# Patient Record
Sex: Female | Born: 1935 | Race: White | Hispanic: No | Marital: Married | Smoking: Never smoker
Health system: Southern US, Community
[De-identification: ages and names within clinical notes are randomized; demographics above are authoritative.]

## PROBLEM LIST (undated history)

## (undated) DIAGNOSIS — I5032 Chronic diastolic (congestive) heart failure: Secondary | ICD-10-CM

## (undated) DIAGNOSIS — Z872 Personal history of diseases of the skin and subcutaneous tissue: Secondary | ICD-10-CM

## (undated) DIAGNOSIS — E039 Hypothyroidism, unspecified: Secondary | ICD-10-CM

## (undated) DIAGNOSIS — I4892 Unspecified atrial flutter: Secondary | ICD-10-CM

## (undated) DIAGNOSIS — E785 Hyperlipidemia, unspecified: Secondary | ICD-10-CM

## (undated) DIAGNOSIS — R350 Frequency of micturition: Secondary | ICD-10-CM

## (undated) DIAGNOSIS — D509 Iron deficiency anemia, unspecified: Secondary | ICD-10-CM

## (undated) DIAGNOSIS — Z7901 Long term (current) use of anticoagulants: Secondary | ICD-10-CM

## (undated) DIAGNOSIS — C801 Malignant (primary) neoplasm, unspecified: Secondary | ICD-10-CM

## (undated) DIAGNOSIS — K5909 Other constipation: Secondary | ICD-10-CM

## (undated) DIAGNOSIS — Z8619 Personal history of other infectious and parasitic diseases: Secondary | ICD-10-CM

## (undated) DIAGNOSIS — K5730 Diverticulosis of large intestine without perforation or abscess without bleeding: Secondary | ICD-10-CM

## (undated) DIAGNOSIS — J189 Pneumonia, unspecified organism: Secondary | ICD-10-CM

## (undated) DIAGNOSIS — I4510 Unspecified right bundle-branch block: Secondary | ICD-10-CM

## (undated) DIAGNOSIS — S81802A Unspecified open wound, left lower leg, initial encounter: Secondary | ICD-10-CM

## (undated) DIAGNOSIS — F419 Anxiety disorder, unspecified: Secondary | ICD-10-CM

## (undated) DIAGNOSIS — Z8719 Personal history of other diseases of the digestive system: Secondary | ICD-10-CM

## (undated) DIAGNOSIS — I10 Essential (primary) hypertension: Secondary | ICD-10-CM

## (undated) DIAGNOSIS — R600 Localized edema: Secondary | ICD-10-CM

## (undated) DIAGNOSIS — I4819 Other persistent atrial fibrillation: Secondary | ICD-10-CM

## (undated) DIAGNOSIS — M1990 Unspecified osteoarthritis, unspecified site: Secondary | ICD-10-CM

## (undated) DIAGNOSIS — M545 Low back pain, unspecified: Secondary | ICD-10-CM

## (undated) DIAGNOSIS — K219 Gastro-esophageal reflux disease without esophagitis: Secondary | ICD-10-CM

## (undated) DIAGNOSIS — M8580 Other specified disorders of bone density and structure, unspecified site: Secondary | ICD-10-CM

## (undated) DIAGNOSIS — C50919 Malignant neoplasm of unspecified site of unspecified female breast: Secondary | ICD-10-CM

## (undated) DIAGNOSIS — M4306 Spondylolysis, lumbar region: Secondary | ICD-10-CM

## (undated) HISTORY — PX: TRANSTHORACIC ECHOCARDIOGRAM: SHX275

## (undated) HISTORY — DX: Low back pain, unspecified: M54.50

## (undated) HISTORY — PX: EYE SURGERY: SHX253

## (undated) HISTORY — DX: Unspecified atrial flutter: I48.92

## (undated) HISTORY — DX: Malignant neoplasm of unspecified site of unspecified female breast: C50.919

## (undated) HISTORY — PX: TONSILLECTOMY: SUR1361

## (undated) HISTORY — DX: Essential (primary) hypertension: I10

## (undated) HISTORY — DX: Low back pain: M54.5

## (undated) HISTORY — DX: Other specified disorders of bone density and structure, unspecified site: M85.80

## (undated) HISTORY — DX: Chronic diastolic (congestive) heart failure: I50.32

## (undated) HISTORY — DX: Hypothyroidism, unspecified: E03.9

## (undated) HISTORY — DX: Unspecified osteoarthritis, unspecified site: M19.90

## (undated) HISTORY — DX: Iron deficiency anemia, unspecified: D50.9

## (undated) NOTE — *Deleted (*Deleted)
Patient is a 48 year old female presenting today with complaint of right hip pain.  Approximately 6 weeks ago patient had a fall at her independent living facility resulting in new thoracic compression fractures.  At the time of her fall it was also reported that she had right hip pain however she has had a replacement and CT of the hip was negative at that time.  Patient reports this week she finished her rehab and has been home.  She reports since being home she has had worsening right hip pain.  She denies any new falls or known injury.  However to get up and move she reports its been extremely difficult and her husband has had to help her with everything.  She has very little pain when sitting in bed but it is activated when she attempts to lift her leg.  She has pain in the lateral and posterior portion of her hip with this movement.  She has no pain with axial loading and is neurovascularly intact.  Concern for possible muscular injury versus radicular pain versus bursitis.  No symptoms to suggest septic hip.  Plain films pending.  Patient given pain control with Tylenol.

---

## 1995-01-21 HISTORY — PX: SHOULDER OPEN ROTATOR CUFF REPAIR: SHX2407

## 1997-11-23 ENCOUNTER — Other Ambulatory Visit: Admission: RE | Admit: 1997-11-23 | Discharge: 1997-11-23 | Payer: Self-pay | Admitting: Internal Medicine

## 2001-08-31 ENCOUNTER — Other Ambulatory Visit: Admission: RE | Admit: 2001-08-31 | Discharge: 2001-08-31 | Payer: Self-pay | Admitting: General Practice

## 2001-12-04 ENCOUNTER — Inpatient Hospital Stay (HOSPITAL_COMMUNITY): Admission: EM | Admit: 2001-12-04 | Discharge: 2001-12-10 | Payer: Self-pay | Admitting: Emergency Medicine

## 2001-12-09 ENCOUNTER — Encounter: Payer: Self-pay | Admitting: Gastroenterology

## 2001-12-09 ENCOUNTER — Encounter (INDEPENDENT_AMBULATORY_CARE_PROVIDER_SITE_OTHER): Payer: Self-pay | Admitting: *Deleted

## 2001-12-09 LAB — HM COLONOSCOPY

## 2003-12-28 ENCOUNTER — Ambulatory Visit: Payer: Self-pay | Admitting: Internal Medicine

## 2004-01-02 ENCOUNTER — Ambulatory Visit: Payer: Self-pay | Admitting: Internal Medicine

## 2004-01-31 ENCOUNTER — Ambulatory Visit: Payer: Self-pay | Admitting: Internal Medicine

## 2004-04-23 ENCOUNTER — Ambulatory Visit: Payer: Self-pay | Admitting: Internal Medicine

## 2004-04-29 ENCOUNTER — Encounter: Admission: RE | Admit: 2004-04-29 | Discharge: 2004-04-29 | Payer: Self-pay | Admitting: Internal Medicine

## 2004-04-30 ENCOUNTER — Ambulatory Visit: Payer: Self-pay | Admitting: Internal Medicine

## 2004-05-20 ENCOUNTER — Ambulatory Visit: Payer: Self-pay | Admitting: Internal Medicine

## 2004-10-23 ENCOUNTER — Ambulatory Visit: Payer: Self-pay | Admitting: Internal Medicine

## 2004-11-20 ENCOUNTER — Ambulatory Visit: Payer: Self-pay | Admitting: Internal Medicine

## 2005-05-21 ENCOUNTER — Ambulatory Visit: Payer: Self-pay | Admitting: Internal Medicine

## 2005-06-05 ENCOUNTER — Encounter (HOSPITAL_BASED_OUTPATIENT_CLINIC_OR_DEPARTMENT_OTHER): Admission: RE | Admit: 2005-06-05 | Discharge: 2005-08-27 | Payer: Self-pay | Admitting: Surgery

## 2005-08-26 ENCOUNTER — Ambulatory Visit: Payer: Self-pay | Admitting: Internal Medicine

## 2005-11-26 ENCOUNTER — Ambulatory Visit: Payer: Self-pay | Admitting: Internal Medicine

## 2006-02-27 ENCOUNTER — Ambulatory Visit: Payer: Self-pay | Admitting: Internal Medicine

## 2006-02-27 LAB — CONVERTED CEMR LAB
BUN: 13 mg/dL (ref 6–23)
CO2: 30 meq/L (ref 19–32)
Calcium: 10.3 mg/dL (ref 8.4–10.5)
Chloride: 89 meq/L — ABNORMAL LOW (ref 96–112)
Creatinine, Ser: 0.8 mg/dL (ref 0.4–1.2)
GFR calc Af Amer: 89 mL/min
GFR calc non Af Amer: 74 mL/min
Glucose, Bld: 84 mg/dL (ref 70–99)
Hgb A1c MFr Bld: 5 % (ref 4.6–6.0)
Potassium: 4 meq/L (ref 3.5–5.1)
Sodium: 131 meq/L — ABNORMAL LOW (ref 135–145)
TSH: 0.44 microintl units/mL (ref 0.35–5.50)
Vit D, 1,25-Dihydroxy: 37 (ref 20–57)

## 2006-08-18 ENCOUNTER — Encounter: Admission: RE | Admit: 2006-08-18 | Discharge: 2006-08-18 | Payer: Self-pay | Admitting: Orthopedic Surgery

## 2006-08-28 ENCOUNTER — Ambulatory Visit: Payer: Self-pay | Admitting: Internal Medicine

## 2006-11-30 ENCOUNTER — Encounter: Payer: Self-pay | Admitting: Internal Medicine

## 2006-11-30 ENCOUNTER — Telehealth: Payer: Self-pay | Admitting: Internal Medicine

## 2006-11-30 DIAGNOSIS — I10 Essential (primary) hypertension: Secondary | ICD-10-CM | POA: Insufficient documentation

## 2006-11-30 DIAGNOSIS — M899 Disorder of bone, unspecified: Secondary | ICD-10-CM | POA: Insufficient documentation

## 2006-11-30 DIAGNOSIS — M949 Disorder of cartilage, unspecified: Secondary | ICD-10-CM

## 2006-11-30 DIAGNOSIS — M1990 Unspecified osteoarthritis, unspecified site: Secondary | ICD-10-CM | POA: Insufficient documentation

## 2006-12-28 ENCOUNTER — Encounter: Payer: Self-pay | Admitting: Internal Medicine

## 2007-02-25 ENCOUNTER — Ambulatory Visit: Payer: Self-pay | Admitting: Internal Medicine

## 2007-02-25 DIAGNOSIS — J209 Acute bronchitis, unspecified: Secondary | ICD-10-CM | POA: Insufficient documentation

## 2007-03-02 ENCOUNTER — Ambulatory Visit: Payer: Self-pay | Admitting: Internal Medicine

## 2007-03-02 DIAGNOSIS — R0602 Shortness of breath: Secondary | ICD-10-CM | POA: Insufficient documentation

## 2007-05-10 ENCOUNTER — Encounter: Payer: Self-pay | Admitting: Internal Medicine

## 2007-05-12 ENCOUNTER — Telehealth: Payer: Self-pay | Admitting: Internal Medicine

## 2007-06-01 ENCOUNTER — Ambulatory Visit: Payer: Self-pay | Admitting: Internal Medicine

## 2007-07-19 ENCOUNTER — Encounter: Payer: Self-pay | Admitting: Internal Medicine

## 2007-10-05 ENCOUNTER — Ambulatory Visit: Payer: Self-pay | Admitting: Internal Medicine

## 2007-10-05 DIAGNOSIS — R5381 Other malaise: Secondary | ICD-10-CM | POA: Insufficient documentation

## 2007-10-05 DIAGNOSIS — R5383 Other fatigue: Secondary | ICD-10-CM

## 2007-10-05 DIAGNOSIS — R509 Fever, unspecified: Secondary | ICD-10-CM | POA: Insufficient documentation

## 2007-10-07 LAB — CONVERTED CEMR LAB
BUN: 25 mg/dL — ABNORMAL HIGH (ref 6–23)
Basophils Absolute: 0.1 10*3/uL (ref 0.0–0.1)
Basophils Relative: 1.3 % (ref 0.0–3.0)
Bilirubin Urine: NEGATIVE
CO2: 31 meq/L (ref 19–32)
Calcium: 11.1 mg/dL — ABNORMAL HIGH (ref 8.4–10.5)
Chloride: 96 meq/L (ref 96–112)
Creatinine, Ser: 1.4 mg/dL — ABNORMAL HIGH (ref 0.4–1.2)
Crystals: NEGATIVE
Eosinophils Absolute: 0.1 10*3/uL (ref 0.0–0.7)
Eosinophils Relative: 2 % (ref 0.0–5.0)
GFR calc Af Amer: 47 mL/min
GFR calc non Af Amer: 39 mL/min
Glucose, Bld: 130 mg/dL — ABNORMAL HIGH (ref 70–99)
HCT: 35.4 % — ABNORMAL LOW (ref 36.0–46.0)
Hemoglobin, Urine: NEGATIVE
Hemoglobin: 12.4 g/dL (ref 12.0–15.0)
Ketones, ur: NEGATIVE mg/dL
Lymphocytes Relative: 26.9 % (ref 12.0–46.0)
MCHC: 34.9 g/dL (ref 30.0–36.0)
MCV: 106.9 fL — ABNORMAL HIGH (ref 78.0–100.0)
Monocytes Absolute: 0.5 10*3/uL (ref 0.1–1.0)
Monocytes Relative: 9.8 % (ref 3.0–12.0)
Mucus, UA: NEGATIVE
Neutro Abs: 3.1 10*3/uL (ref 1.4–7.7)
Neutrophils Relative %: 60 % (ref 43.0–77.0)
Nitrite: NEGATIVE
Platelets: 277 10*3/uL (ref 150–400)
Potassium: 4.1 meq/L (ref 3.5–5.1)
RBC / HPF: NONE SEEN
RBC: 3.31 M/uL — ABNORMAL LOW (ref 3.87–5.11)
RDW: 12.4 % (ref 11.5–14.6)
Sodium: 135 meq/L (ref 135–145)
Specific Gravity, Urine: 1.01 (ref 1.000–1.03)
TSH: 0.4 microintl units/mL (ref 0.35–5.50)
Total Protein, Urine: NEGATIVE mg/dL
Urine Glucose: NEGATIVE mg/dL
Urobilinogen, UA: 0.2 (ref 0.0–1.0)
WBC: 5.2 10*3/uL (ref 4.5–10.5)
pH: 7.5 (ref 5.0–8.0)

## 2007-11-10 ENCOUNTER — Telehealth: Payer: Self-pay | Admitting: Internal Medicine

## 2007-11-22 ENCOUNTER — Encounter: Payer: Self-pay | Admitting: Internal Medicine

## 2008-02-02 ENCOUNTER — Ambulatory Visit: Payer: Self-pay | Admitting: Internal Medicine

## 2008-02-04 LAB — CONVERTED CEMR LAB
Bilirubin Urine: NEGATIVE
Crystals: NEGATIVE
Hemoglobin, Urine: NEGATIVE
Ketones, ur: NEGATIVE mg/dL
Mucus, UA: NEGATIVE
Nitrite: NEGATIVE
Specific Gravity, Urine: <=1.005 (ref 1.000–1.03)
Total Protein, Urine: NEGATIVE mg/dL
Urine Glucose: NEGATIVE mg/dL
Urobilinogen, UA: 0.2 (ref 0.0–1.0)
pH: 6.5 (ref 5.0–8.0)

## 2008-04-24 ENCOUNTER — Encounter: Payer: Self-pay | Admitting: Internal Medicine

## 2008-04-25 ENCOUNTER — Encounter: Payer: Self-pay | Admitting: Internal Medicine

## 2008-05-03 ENCOUNTER — Encounter: Payer: Self-pay | Admitting: Internal Medicine

## 2008-05-24 ENCOUNTER — Ambulatory Visit: Payer: Self-pay | Admitting: Internal Medicine

## 2008-05-24 LAB — CONVERTED CEMR LAB
ALT: 25 units/L (ref 0–35)
AST: 36 units/L (ref 0–37)
Albumin: 4.3 g/dL (ref 3.5–5.2)
Alkaline Phosphatase: 48 units/L (ref 39–117)
BUN: 15 mg/dL (ref 6–23)
Bilirubin, Direct: 0.3 mg/dL (ref 0.0–0.3)
CO2: 28 meq/L (ref 19–32)
Calcium: 9.3 mg/dL (ref 8.4–10.5)
Chloride: 99 meq/L (ref 96–112)
Cholesterol: 221 mg/dL — ABNORMAL HIGH (ref 0–200)
Creatinine, Ser: 1.1 mg/dL (ref 0.4–1.2)
Direct LDL: 110.3 mg/dL
GFR calc non Af Amer: 50.72 mL/min (ref >60)
Glucose, Bld: 86 mg/dL (ref 70–99)
HDL: 91.2 mg/dL (ref >39.00)
Hgb A1c MFr Bld: 5.3 % (ref 4.6–6.5)
Potassium: 3.9 meq/L (ref 3.5–5.1)
Sodium: 136 meq/L (ref 135–145)
TSH: 0.36 microintl units/mL (ref 0.35–5.50)
Total Bilirubin: 1.8 mg/dL — ABNORMAL HIGH (ref 0.3–1.2)
Total CHOL/HDL Ratio: 2
Total Protein: 7.2 g/dL (ref 6.0–8.3)
Triglycerides: 63 mg/dL (ref 0.0–149.0)
VLDL: 12.6 mg/dL (ref 0.0–40.0)

## 2008-05-31 ENCOUNTER — Ambulatory Visit: Payer: Self-pay | Admitting: Internal Medicine

## 2008-05-31 DIAGNOSIS — I4892 Unspecified atrial flutter: Secondary | ICD-10-CM | POA: Insufficient documentation

## 2008-07-18 ENCOUNTER — Ambulatory Visit: Payer: Self-pay | Admitting: Cardiovascular Disease

## 2008-07-18 DIAGNOSIS — I4891 Unspecified atrial fibrillation: Secondary | ICD-10-CM | POA: Insufficient documentation

## 2008-07-20 ENCOUNTER — Encounter: Payer: Self-pay | Admitting: Cardiovascular Disease

## 2008-07-20 ENCOUNTER — Ambulatory Visit: Payer: Self-pay

## 2008-07-26 ENCOUNTER — Telehealth: Payer: Self-pay | Admitting: Cardiovascular Disease

## 2008-08-07 ENCOUNTER — Encounter: Admission: RE | Admit: 2008-08-07 | Discharge: 2008-08-07 | Payer: Self-pay | Admitting: Orthopedic Surgery

## 2008-08-10 ENCOUNTER — Telehealth: Payer: Self-pay | Admitting: Cardiovascular Disease

## 2008-08-17 ENCOUNTER — Ambulatory Visit: Payer: Self-pay | Admitting: Cardiovascular Disease

## 2008-08-18 ENCOUNTER — Telehealth: Payer: Self-pay | Admitting: Cardiovascular Disease

## 2008-09-04 ENCOUNTER — Ambulatory Visit: Payer: Self-pay | Admitting: Internal Medicine

## 2008-09-04 DIAGNOSIS — M545 Low back pain, unspecified: Secondary | ICD-10-CM | POA: Insufficient documentation

## 2008-09-06 ENCOUNTER — Encounter (INDEPENDENT_AMBULATORY_CARE_PROVIDER_SITE_OTHER): Payer: Self-pay | Admitting: *Deleted

## 2008-10-18 ENCOUNTER — Ambulatory Visit: Payer: Self-pay | Admitting: Cardiovascular Disease

## 2008-10-23 ENCOUNTER — Ambulatory Visit: Payer: Self-pay | Admitting: Internal Medicine

## 2008-10-23 LAB — CONVERTED CEMR LAB: POC INR: 2.1

## 2008-10-30 ENCOUNTER — Ambulatory Visit: Payer: Self-pay | Admitting: Internal Medicine

## 2008-10-30 LAB — CONVERTED CEMR LAB: POC INR: 3.7

## 2008-11-04 ENCOUNTER — Encounter: Payer: Self-pay | Admitting: Internal Medicine

## 2008-11-06 ENCOUNTER — Telehealth: Payer: Self-pay | Admitting: Internal Medicine

## 2008-11-06 ENCOUNTER — Ambulatory Visit: Payer: Self-pay | Admitting: Cardiovascular Disease

## 2008-11-06 LAB — CONVERTED CEMR LAB: POC INR: 3.5

## 2008-11-07 ENCOUNTER — Ambulatory Visit: Payer: Self-pay | Admitting: Internal Medicine

## 2008-11-07 DIAGNOSIS — G4700 Insomnia, unspecified: Secondary | ICD-10-CM | POA: Insufficient documentation

## 2008-11-08 ENCOUNTER — Encounter: Payer: Self-pay | Admitting: Internal Medicine

## 2008-11-10 ENCOUNTER — Encounter: Payer: Self-pay | Admitting: Internal Medicine

## 2008-11-13 ENCOUNTER — Ambulatory Visit: Payer: Self-pay | Admitting: Cardiology

## 2008-11-13 LAB — CONVERTED CEMR LAB: POC INR: 2.5

## 2008-11-22 ENCOUNTER — Encounter: Payer: Self-pay | Admitting: Internal Medicine

## 2008-11-23 ENCOUNTER — Ambulatory Visit: Payer: Self-pay | Admitting: Internal Medicine

## 2008-11-23 LAB — CONVERTED CEMR LAB: POC INR: 3.6

## 2008-11-28 ENCOUNTER — Encounter (INDEPENDENT_AMBULATORY_CARE_PROVIDER_SITE_OTHER): Payer: Self-pay | Admitting: *Deleted

## 2008-12-04 ENCOUNTER — Ambulatory Visit: Payer: Self-pay | Admitting: Cardiology

## 2008-12-04 ENCOUNTER — Ambulatory Visit: Payer: Self-pay | Admitting: Cardiovascular Disease

## 2008-12-04 LAB — CONVERTED CEMR LAB: POC INR: 2.5

## 2008-12-18 ENCOUNTER — Ambulatory Visit: Payer: Self-pay | Admitting: Cardiology

## 2008-12-18 LAB — CONVERTED CEMR LAB
INR: 2.6
POC INR: 2.6

## 2009-01-02 ENCOUNTER — Inpatient Hospital Stay (HOSPITAL_COMMUNITY): Admission: RE | Admit: 2009-01-02 | Discharge: 2009-01-10 | Payer: Self-pay | Admitting: Neurosurgery

## 2009-01-02 ENCOUNTER — Ambulatory Visit: Payer: Self-pay | Admitting: Cardiology

## 2009-01-02 ENCOUNTER — Ambulatory Visit: Payer: Self-pay | Admitting: Pulmonary Disease

## 2009-01-02 HISTORY — PX: LUMBAR FUSION: SHX111

## 2009-01-04 ENCOUNTER — Ambulatory Visit: Payer: Self-pay | Admitting: Vascular Surgery

## 2009-01-04 ENCOUNTER — Encounter (INDEPENDENT_AMBULATORY_CARE_PROVIDER_SITE_OTHER): Payer: Self-pay | Admitting: Internal Medicine

## 2009-01-04 ENCOUNTER — Encounter: Payer: Self-pay | Admitting: Cardiology

## 2009-01-15 ENCOUNTER — Encounter: Payer: Self-pay | Admitting: Internal Medicine

## 2009-01-15 ENCOUNTER — Encounter (INDEPENDENT_AMBULATORY_CARE_PROVIDER_SITE_OTHER): Payer: Self-pay | Admitting: Cardiology

## 2009-01-15 LAB — CONVERTED CEMR LAB
POC INR: 3.3
Prothrombin Time: 33.3 s

## 2009-01-18 ENCOUNTER — Encounter (INDEPENDENT_AMBULATORY_CARE_PROVIDER_SITE_OTHER): Payer: Self-pay | Admitting: Cardiology

## 2009-01-18 ENCOUNTER — Encounter: Payer: Self-pay | Admitting: Internal Medicine

## 2009-01-18 LAB — CONVERTED CEMR LAB: POC INR: 2.5

## 2009-01-20 DIAGNOSIS — D509 Iron deficiency anemia, unspecified: Secondary | ICD-10-CM

## 2009-01-20 HISTORY — DX: Iron deficiency anemia, unspecified: D50.9

## 2009-01-29 ENCOUNTER — Telehealth: Payer: Self-pay | Admitting: Cardiovascular Disease

## 2009-02-01 ENCOUNTER — Encounter: Payer: Self-pay | Admitting: Internal Medicine

## 2009-02-01 LAB — CONVERTED CEMR LAB
POC INR: 3.5
Prothrombin Time: 35 s

## 2009-02-06 ENCOUNTER — Ambulatory Visit: Payer: Self-pay | Admitting: Internal Medicine

## 2009-02-08 LAB — CONVERTED CEMR LAB
ALT: 18 units/L (ref 0–35)
AST: 19 units/L (ref 0–37)
Albumin: 4.2 g/dL (ref 3.5–5.2)
Alkaline Phosphatase: 53 units/L (ref 39–117)
BUN: 35 mg/dL — ABNORMAL HIGH (ref 6–23)
Basophils Absolute: 0.1 10*3/uL (ref 0.0–0.1)
Basophils Relative: 1.2 % (ref 0.0–3.0)
Bilirubin, Direct: 0.2 mg/dL (ref 0.0–0.3)
CO2: 27 meq/L (ref 19–32)
Calcium: 10.3 mg/dL (ref 8.4–10.5)
Chloride: 100 meq/L (ref 96–112)
Cholesterol: 153 mg/dL (ref 0–200)
Creatinine, Ser: 1.9 mg/dL — ABNORMAL HIGH (ref 0.4–1.2)
Eosinophils Absolute: 0.2 10*3/uL (ref 0.0–0.7)
Eosinophils Relative: 3.2 % (ref 0.0–5.0)
GFR calc non Af Amer: 26.95 mL/min (ref >60)
Glucose, Bld: 81 mg/dL (ref 70–99)
HCT: 34.6 % — ABNORMAL LOW (ref 36.0–46.0)
HDL: 54.1 mg/dL (ref >39.00)
Hemoglobin: 11.5 g/dL — ABNORMAL LOW (ref 12.0–15.0)
LDL Cholesterol: 87 mg/dL (ref 0–99)
Lymphocytes Relative: 23.3 % (ref 12.0–46.0)
Lymphs Abs: 1.2 10*3/uL (ref 0.7–4.0)
MCHC: 33.2 g/dL (ref 30.0–36.0)
MCV: 110.3 fL — ABNORMAL HIGH (ref 78.0–100.0)
Monocytes Absolute: 0.5 10*3/uL (ref 0.1–1.0)
Monocytes Relative: 9.9 % (ref 3.0–12.0)
Neutro Abs: 3 10*3/uL (ref 1.4–7.7)
Neutrophils Relative %: 62.4 % (ref 43.0–77.0)
Platelets: 216 10*3/uL (ref 150.0–400.0)
Potassium: 4.2 meq/L (ref 3.5–5.1)
RBC: 3.14 M/uL — ABNORMAL LOW (ref 3.87–5.11)
RDW: 12.1 % (ref 11.5–14.6)
Sodium: 140 meq/L (ref 135–145)
TSH: 0.68 microintl units/mL (ref 0.35–5.50)
Total Bilirubin: 1.3 mg/dL — ABNORMAL HIGH (ref 0.3–1.2)
Total CHOL/HDL Ratio: 3
Total Protein: 8.2 g/dL (ref 6.0–8.3)
Triglycerides: 60 mg/dL (ref 0.0–149.0)
VLDL: 12 mg/dL (ref 0.0–40.0)
Vitamin B-12: >1500 pg/mL — ABNORMAL HIGH (ref 211–911)
WBC: 5 10*3/uL (ref 4.5–10.5)

## 2009-02-12 ENCOUNTER — Ambulatory Visit: Payer: Self-pay | Admitting: Cardiovascular Disease

## 2009-02-12 DIAGNOSIS — R609 Edema, unspecified: Secondary | ICD-10-CM | POA: Insufficient documentation

## 2009-02-13 ENCOUNTER — Ambulatory Visit: Payer: Self-pay | Admitting: Internal Medicine

## 2009-02-28 ENCOUNTER — Encounter: Payer: Self-pay | Admitting: Internal Medicine

## 2009-02-28 ENCOUNTER — Ambulatory Visit: Payer: Self-pay | Admitting: Cardiology

## 2009-02-28 LAB — CONVERTED CEMR LAB: POC INR: 1.9

## 2009-03-07 ENCOUNTER — Encounter: Admission: RE | Admit: 2009-03-07 | Discharge: 2009-05-30 | Payer: Self-pay | Admitting: Neurosurgery

## 2009-03-13 ENCOUNTER — Telehealth: Payer: Self-pay | Admitting: Internal Medicine

## 2009-03-13 ENCOUNTER — Ambulatory Visit: Payer: Self-pay | Admitting: Internal Medicine

## 2009-03-13 ENCOUNTER — Ambulatory Visit: Payer: Self-pay

## 2009-03-13 ENCOUNTER — Ambulatory Visit: Payer: Self-pay | Admitting: Cardiology

## 2009-03-13 ENCOUNTER — Encounter: Payer: Self-pay | Admitting: Internal Medicine

## 2009-03-13 LAB — CONVERTED CEMR LAB: POC INR: 1.8

## 2009-03-14 ENCOUNTER — Telehealth: Payer: Self-pay | Admitting: Internal Medicine

## 2009-03-19 ENCOUNTER — Ambulatory Visit: Payer: Self-pay | Admitting: Internal Medicine

## 2009-03-19 DIAGNOSIS — N259 Disorder resulting from impaired renal tubular function, unspecified: Secondary | ICD-10-CM | POA: Insufficient documentation

## 2009-03-19 DIAGNOSIS — R55 Syncope and collapse: Secondary | ICD-10-CM | POA: Insufficient documentation

## 2009-03-21 LAB — CONVERTED CEMR LAB
BUN: 39 mg/dL — ABNORMAL HIGH (ref 6–23)
CO2: 32 meq/L (ref 19–32)
Calcium: 10.2 mg/dL (ref 8.4–10.5)
Chloride: 94 meq/L — ABNORMAL LOW (ref 96–112)
Creatinine, Ser: 2.3 mg/dL — ABNORMAL HIGH (ref 0.4–1.2)
GFR calc non Af Amer: 21.61 mL/min (ref >60)
Glucose, Bld: 122 mg/dL — ABNORMAL HIGH (ref 70–99)
Potassium: 3.5 meq/L (ref 3.5–5.1)
Sodium: 136 meq/L (ref 135–145)

## 2009-03-28 ENCOUNTER — Ambulatory Visit: Payer: Self-pay | Admitting: Cardiovascular Disease

## 2009-03-28 LAB — CONVERTED CEMR LAB: POC INR: 1.9

## 2009-04-04 ENCOUNTER — Encounter: Payer: Self-pay | Admitting: Internal Medicine

## 2009-04-18 ENCOUNTER — Ambulatory Visit: Payer: Self-pay | Admitting: Cardiovascular Disease

## 2009-04-18 LAB — CONVERTED CEMR LAB: POC INR: 1.9

## 2009-04-26 ENCOUNTER — Ambulatory Visit: Payer: Self-pay | Admitting: Internal Medicine

## 2009-04-30 LAB — CONVERTED CEMR LAB
BUN: 14 mg/dL (ref 6–23)
CO2: 31 meq/L (ref 19–32)
Calcium: 9.7 mg/dL (ref 8.4–10.5)
Chloride: 93 meq/L — ABNORMAL LOW (ref 96–112)
Creatinine, Ser: 1.1 mg/dL (ref 0.4–1.2)
GFR calc non Af Amer: 50.61 mL/min (ref >60)
Glucose, Bld: 78 mg/dL (ref 70–99)
Potassium: 4.1 meq/L (ref 3.5–5.1)
Sodium: 134 meq/L — ABNORMAL LOW (ref 135–145)

## 2009-05-03 ENCOUNTER — Ambulatory Visit: Payer: Self-pay | Admitting: Internal Medicine

## 2009-05-09 ENCOUNTER — Telehealth (INDEPENDENT_AMBULATORY_CARE_PROVIDER_SITE_OTHER): Payer: Self-pay | Admitting: *Deleted

## 2009-05-09 ENCOUNTER — Ambulatory Visit: Payer: Self-pay | Admitting: Cardiology

## 2009-05-09 LAB — CONVERTED CEMR LAB: POC INR: 2

## 2009-05-21 ENCOUNTER — Encounter: Payer: Self-pay | Admitting: Internal Medicine

## 2009-05-25 ENCOUNTER — Ambulatory Visit: Payer: Self-pay | Admitting: Internal Medicine

## 2009-05-25 LAB — CONVERTED CEMR LAB: POC INR: 1.9

## 2009-06-12 ENCOUNTER — Telehealth: Payer: Self-pay | Admitting: Cardiovascular Disease

## 2009-06-14 ENCOUNTER — Ambulatory Visit: Payer: Self-pay | Admitting: Cardiology

## 2009-06-14 LAB — CONVERTED CEMR LAB: POC INR: 2.1

## 2009-07-12 ENCOUNTER — Ambulatory Visit: Payer: Self-pay | Admitting: Internal Medicine

## 2009-07-12 LAB — CONVERTED CEMR LAB: POC INR: 2.6

## 2009-08-10 ENCOUNTER — Ambulatory Visit: Payer: Self-pay | Admitting: Cardiology

## 2009-08-10 ENCOUNTER — Ambulatory Visit: Payer: Self-pay | Admitting: Cardiovascular Disease

## 2009-08-10 LAB — CONVERTED CEMR LAB: POC INR: 2.6

## 2009-08-13 ENCOUNTER — Encounter: Payer: Self-pay | Admitting: Internal Medicine

## 2009-08-15 ENCOUNTER — Ambulatory Visit: Payer: Self-pay | Admitting: Internal Medicine

## 2009-09-07 ENCOUNTER — Ambulatory Visit: Payer: Self-pay | Admitting: Cardiology

## 2009-09-07 LAB — CONVERTED CEMR LAB: POC INR: 4

## 2009-09-27 ENCOUNTER — Ambulatory Visit: Payer: Self-pay | Admitting: Internal Medicine

## 2009-09-27 LAB — CONVERTED CEMR LAB: POC INR: 3.4

## 2009-10-11 ENCOUNTER — Telehealth: Payer: Self-pay | Admitting: Internal Medicine

## 2009-10-11 ENCOUNTER — Ambulatory Visit: Payer: Self-pay | Admitting: Cardiology

## 2009-10-11 LAB — CONVERTED CEMR LAB: POC INR: 3.2

## 2009-10-25 ENCOUNTER — Telehealth: Payer: Self-pay | Admitting: Gastroenterology

## 2009-10-25 ENCOUNTER — Ambulatory Visit: Payer: Self-pay | Admitting: Internal Medicine

## 2009-10-25 DIAGNOSIS — K921 Melena: Secondary | ICD-10-CM | POA: Insufficient documentation

## 2009-10-25 DIAGNOSIS — E039 Hypothyroidism, unspecified: Secondary | ICD-10-CM | POA: Insufficient documentation

## 2009-10-25 LAB — CONVERTED CEMR LAB
POC INR: 3.1
Sed Rate: 55 mm/hr — ABNORMAL HIGH (ref 0–22)
Vitamin B-12: 1229 pg/mL — ABNORMAL HIGH (ref 211–911)

## 2009-10-26 ENCOUNTER — Ambulatory Visit: Payer: Self-pay | Admitting: Gastroenterology

## 2009-10-26 ENCOUNTER — Encounter (INDEPENDENT_AMBULATORY_CARE_PROVIDER_SITE_OTHER): Payer: Self-pay | Admitting: *Deleted

## 2009-10-26 DIAGNOSIS — R195 Other fecal abnormalities: Secondary | ICD-10-CM | POA: Insufficient documentation

## 2009-10-26 LAB — CONVERTED CEMR LAB
ALT: 22 units/L (ref 0–35)
AST: 24 units/L (ref 0–37)
Albumin: 3.5 g/dL (ref 3.5–5.2)
Alkaline Phosphatase: 47 units/L (ref 39–117)
BUN: 29 mg/dL — ABNORMAL HIGH (ref 6–23)
Basophils Absolute: 0 10*3/uL (ref 0.0–0.1)
Basophils Absolute: 0.1 10*3/uL (ref 0.0–0.1)
Basophils Relative: 0.4 % (ref 0.0–3.0)
Basophils Relative: 0.6 % (ref 0.0–3.0)
Bilirubin Urine: NEGATIVE
Bilirubin, Direct: 0.2 mg/dL (ref 0.0–0.3)
CO2: 28 meq/L (ref 19–32)
Calcium: 9.2 mg/dL (ref 8.4–10.5)
Chloride: 95 meq/L — ABNORMAL LOW (ref 96–112)
Creatinine, Ser: 1.4 mg/dL — ABNORMAL HIGH (ref 0.4–1.2)
Eosinophils Absolute: 0.1 10*3/uL (ref 0.0–0.7)
Eosinophils Absolute: 0.1 10*3/uL (ref 0.0–0.7)
Eosinophils Relative: 0.8 % (ref 0.0–5.0)
Eosinophils Relative: 0.6 % (ref 0.0–5.0)
GFR calc non Af Amer: 37.34 mL/min (ref >60)
Glucose, Bld: 116 mg/dL — ABNORMAL HIGH (ref 70–99)
HCT: 29.5 % — ABNORMAL LOW (ref 36.0–46.0)
HCT: 27.3 % — ABNORMAL LOW (ref 36.0–46.0)
Hemoglobin: 10.2 g/dL — ABNORMAL LOW (ref 12.0–15.0)
Hemoglobin: 9.1 g/dL — ABNORMAL LOW (ref 12.0–15.0)
INR: 3.3 — ABNORMAL HIGH (ref 0.8–1.0)
Ketones, ur: NEGATIVE mg/dL
Lymphocytes Relative: 14.4 % (ref 12.0–46.0)
Lymphocytes Relative: 11.9 % — ABNORMAL LOW (ref 12.0–46.0)
Lymphs Abs: 1.2 10*3/uL (ref 0.7–4.0)
Lymphs Abs: 1.1 10*3/uL (ref 0.7–4.0)
MCHC: 34.4 g/dL (ref 30.0–36.0)
MCHC: 33.5 g/dL (ref 30.0–36.0)
MCV: 111.4 fL — ABNORMAL HIGH (ref 78.0–100.0)
MCV: 110.6 fL — ABNORMAL HIGH (ref 78.0–100.0)
Monocytes Absolute: 0.9 10*3/uL (ref 0.1–1.0)
Monocytes Absolute: 0.7 10*3/uL (ref 0.1–1.0)
Monocytes Relative: 11.3 % (ref 3.0–12.0)
Monocytes Relative: 7.7 % (ref 3.0–12.0)
Neutro Abs: 5.9 10*3/uL (ref 1.4–7.7)
Neutro Abs: 7.2 10*3/uL (ref 1.4–7.7)
Neutrophils Relative %: 73.1 % (ref 43.0–77.0)
Neutrophils Relative %: 79.2 % — ABNORMAL HIGH (ref 43.0–77.0)
Nitrite: NEGATIVE
Platelets: 279 10*3/uL (ref 150.0–400.0)
Platelets: 279 10*3/uL (ref 150.0–400.0)
Potassium: 4.7 meq/L (ref 3.5–5.1)
Pro B Natriuretic peptide (BNP): 394.9 pg/mL — ABNORMAL HIGH (ref 0.0–100.0)
Prothrombin Time: 35.4 s — ABNORMAL HIGH (ref 9.7–11.8)
RBC: 2.65 M/uL — ABNORMAL LOW (ref 3.87–5.11)
RBC: 2.47 M/uL — ABNORMAL LOW (ref 3.87–5.11)
RDW: 14 % (ref 11.5–14.6)
RDW: 13.9 % (ref 11.5–14.6)
Sodium: 131 meq/L — ABNORMAL LOW (ref 135–145)
Specific Gravity, Urine: 1.01 (ref 1.000–1.030)
TSH: 6.06 microintl units/mL — ABNORMAL HIGH (ref 0.35–5.50)
Total Bilirubin: 1.1 mg/dL (ref 0.3–1.2)
Total Protein, Urine: NEGATIVE mg/dL
Total Protein: 6.4 g/dL (ref 6.0–8.3)
Urine Glucose: NEGATIVE mg/dL
Urobilinogen, UA: 0.2 (ref 0.0–1.0)
WBC: 8.1 10*3/uL (ref 4.5–10.5)
WBC: 9.1 10*3/uL (ref 4.5–10.5)
pH: 7 (ref 5.0–8.0)

## 2009-10-29 ENCOUNTER — Ambulatory Visit: Payer: Self-pay | Admitting: Gastroenterology

## 2009-10-29 ENCOUNTER — Encounter (INDEPENDENT_AMBULATORY_CARE_PROVIDER_SITE_OTHER): Payer: Self-pay | Admitting: *Deleted

## 2009-10-29 LAB — CONVERTED CEMR LAB
Basophils Absolute: 0 10*3/uL (ref 0.0–0.1)
Basophils Relative: 0.3 % (ref 0.0–3.0)
Eosinophils Absolute: 0.1 10*3/uL (ref 0.0–0.7)
Eosinophils Relative: 0.8 % (ref 0.0–5.0)
HCT: 24.9 % — ABNORMAL LOW (ref 36.0–46.0)
Hemoglobin: 8.7 g/dL — ABNORMAL LOW (ref 12.0–15.0)
INR: 1.6 — ABNORMAL HIGH (ref 0.8–1.0)
Lymphocytes Relative: 11.4 % — ABNORMAL LOW (ref 12.0–46.0)
Lymphs Abs: 0.8 10*3/uL (ref 0.7–4.0)
MCHC: 34.9 g/dL (ref 30.0–36.0)
MCV: 109.4 fL — ABNORMAL HIGH (ref 78.0–100.0)
Monocytes Absolute: 0.4 10*3/uL (ref 0.1–1.0)
Monocytes Relative: 6.3 % (ref 3.0–12.0)
Neutro Abs: 5.6 10*3/uL (ref 1.4–7.7)
Neutrophils Relative %: 81.2 % — ABNORMAL HIGH (ref 43.0–77.0)
Platelets: 263 10*3/uL (ref 150.0–400.0)
Prothrombin Time: 16.8 s — ABNORMAL HIGH (ref 9.7–11.8)
RBC: 2.28 M/uL — ABNORMAL LOW (ref 3.87–5.11)
RDW: 13.4 % (ref 11.5–14.6)
WBC: 6.9 10*3/uL (ref 4.5–10.5)

## 2009-10-30 ENCOUNTER — Telehealth: Payer: Self-pay | Admitting: Internal Medicine

## 2009-10-31 ENCOUNTER — Ambulatory Visit: Payer: Self-pay | Admitting: Internal Medicine

## 2009-10-31 DIAGNOSIS — M25559 Pain in unspecified hip: Secondary | ICD-10-CM | POA: Insufficient documentation

## 2009-10-31 DIAGNOSIS — D509 Iron deficiency anemia, unspecified: Secondary | ICD-10-CM | POA: Insufficient documentation

## 2009-11-06 ENCOUNTER — Telehealth: Payer: Self-pay | Admitting: Internal Medicine

## 2009-11-07 ENCOUNTER — Telehealth: Payer: Self-pay | Admitting: Internal Medicine

## 2009-11-07 ENCOUNTER — Telehealth: Payer: Self-pay | Admitting: Cardiovascular Disease

## 2009-11-08 ENCOUNTER — Inpatient Hospital Stay (HOSPITAL_COMMUNITY): Admission: EM | Admit: 2009-11-08 | Discharge: 2009-11-13 | Payer: Self-pay | Admitting: Emergency Medicine

## 2009-11-08 ENCOUNTER — Encounter: Payer: Self-pay | Admitting: Internal Medicine

## 2009-11-08 ENCOUNTER — Ambulatory Visit: Payer: Self-pay | Admitting: Cardiology

## 2009-11-08 ENCOUNTER — Ambulatory Visit: Payer: Self-pay | Admitting: Cardiovascular Disease

## 2009-11-09 ENCOUNTER — Ambulatory Visit: Payer: Self-pay | Admitting: Internal Medicine

## 2009-11-09 LAB — CONVERTED CEMR LAB
Basophils Absolute: 0 10*3/uL (ref 0.0–0.1)
Basophils Relative: 0 % (ref 0–1)
Eosinophils Absolute: 0.1 10*3/uL (ref 0.0–0.7)
Eosinophils Relative: 2 % (ref 0–5)
HCT: 24.5 % — ABNORMAL LOW (ref 36.0–46.0)
Hemoglobin: 8.8 g/dL — ABNORMAL LOW (ref 12.0–15.0)
Lymphocytes Relative: 13 % (ref 12–46)
Lymphs Abs: 0.6 10*3/uL — ABNORMAL LOW (ref 0.7–4.0)
MCHC: 35.9 g/dL (ref 30.0–36.0)
MCV: 102.1 fL — ABNORMAL HIGH (ref 78.0–100.0)
Monocytes Absolute: 0.7 10*3/uL (ref 0.1–1.0)
Monocytes Relative: 15 % — ABNORMAL HIGH (ref 3–12)
Neutro Abs: 3.3 10*3/uL (ref 1.7–7.7)
Neutrophils Relative %: 69 % (ref 43–77)
Platelets: 361 10*3/uL (ref 150–400)
RBC: 2.4 M/uL — ABNORMAL LOW (ref 3.87–5.11)
RDW: 11.8 % (ref 11.5–15.5)
WBC: 4.6 10*3/uL (ref 4.0–10.5)

## 2009-11-11 ENCOUNTER — Encounter: Payer: Self-pay | Admitting: Cardiovascular Disease

## 2009-11-13 ENCOUNTER — Telehealth: Payer: Self-pay | Admitting: Internal Medicine

## 2009-11-14 ENCOUNTER — Telehealth: Payer: Self-pay | Admitting: Internal Medicine

## 2009-11-15 ENCOUNTER — Ambulatory Visit: Payer: Self-pay | Admitting: Internal Medicine

## 2009-11-15 ENCOUNTER — Encounter: Payer: Self-pay | Admitting: Cardiovascular Disease

## 2009-11-15 ENCOUNTER — Telehealth: Payer: Self-pay | Admitting: Internal Medicine

## 2009-11-15 ENCOUNTER — Encounter: Payer: Self-pay | Admitting: Internal Medicine

## 2009-11-15 LAB — CONVERTED CEMR LAB
POC INR: 1.73
Prothrombin Time: 20.4 s

## 2009-11-16 ENCOUNTER — Telehealth: Payer: Self-pay | Admitting: Internal Medicine

## 2009-11-19 ENCOUNTER — Ambulatory Visit: Payer: Self-pay | Admitting: Cardiovascular Disease

## 2009-11-20 ENCOUNTER — Telehealth: Payer: Self-pay | Admitting: Internal Medicine

## 2009-11-21 ENCOUNTER — Encounter: Payer: Self-pay | Admitting: Internal Medicine

## 2009-11-21 ENCOUNTER — Telehealth: Payer: Self-pay | Admitting: Internal Medicine

## 2009-11-21 ENCOUNTER — Encounter (INDEPENDENT_AMBULATORY_CARE_PROVIDER_SITE_OTHER): Payer: Self-pay | Admitting: Pharmacist

## 2009-11-21 ENCOUNTER — Encounter: Payer: Self-pay | Admitting: Cardiology

## 2009-11-21 LAB — CONVERTED CEMR LAB
POC INR: 1.7
Prothrombin Time: 20.2 s

## 2009-11-22 ENCOUNTER — Ambulatory Visit: Payer: Self-pay | Admitting: Internal Medicine

## 2009-11-22 DIAGNOSIS — R21 Rash and other nonspecific skin eruption: Secondary | ICD-10-CM | POA: Insufficient documentation

## 2009-11-22 DIAGNOSIS — K279 Peptic ulcer, site unspecified, unspecified as acute or chronic, without hemorrhage or perforation: Secondary | ICD-10-CM | POA: Insufficient documentation

## 2009-11-23 ENCOUNTER — Ambulatory Visit: Payer: Self-pay | Admitting: Oncology

## 2009-11-26 ENCOUNTER — Ambulatory Visit: Payer: Self-pay | Admitting: Internal Medicine

## 2009-11-26 ENCOUNTER — Telehealth: Payer: Self-pay | Admitting: Gastroenterology

## 2009-11-26 ENCOUNTER — Telehealth: Payer: Self-pay | Admitting: Internal Medicine

## 2009-11-28 ENCOUNTER — Encounter: Payer: Self-pay | Admitting: Cardiovascular Disease

## 2009-11-28 ENCOUNTER — Encounter: Payer: Self-pay | Admitting: Internal Medicine

## 2009-11-28 LAB — CBC & DIFF AND RETIC
BASO%: 0.9 % (ref 0.0–2.0)
Basophils Absolute: 0.1 10e3/uL (ref 0.0–0.1)
EOS%: 2.1 % (ref 0.0–7.0)
Eosinophils Absolute: 0.1 10e3/uL (ref 0.0–0.5)
HCT: 26.9 % — ABNORMAL LOW (ref 34.8–46.6)
HGB: 8.7 g/dL — ABNORMAL LOW (ref 11.6–15.9)
Immature Retic Fract: 12.8 % — ABNORMAL HIGH (ref 0.00–10.70)
LYMPH%: 18.3 % (ref 14.0–49.7)
MCH: 33.1 pg (ref 25.1–34.0)
MCHC: 32.3 g/dL (ref 31.5–36.0)
MCV: 102.3 fL — ABNORMAL HIGH (ref 79.5–101.0)
MONO#: 0.6 10e3/uL (ref 0.1–0.9)
MONO%: 10.3 % (ref 0.0–14.0)
NEUT#: 4 10e3/uL (ref 1.5–6.5)
NEUT%: 68.4 % (ref 38.4–76.8)
Platelets: 347 10e3/uL (ref 145–400)
RBC: 2.63 10e6/uL — ABNORMAL LOW (ref 3.70–5.45)
RDW: 13.1 % (ref 11.2–14.5)
Retic %: 1.39 % (ref 0.50–1.50)
Retic Ct Abs: 36.56 10e3/uL (ref 18.30–72.70)
WBC: 5.9 10e3/uL (ref 3.9–10.3)
lymph#: 1.1 10e3/uL (ref 0.9–3.3)

## 2009-11-28 LAB — CONVERTED CEMR LAB
POC INR: 2.37
Prothrombin Time: 25.4 s

## 2009-11-28 LAB — CHCC SMEAR

## 2009-11-29 ENCOUNTER — Ambulatory Visit: Payer: Self-pay | Admitting: Internal Medicine

## 2009-11-29 ENCOUNTER — Encounter: Payer: Self-pay | Admitting: Cardiovascular Disease

## 2009-11-29 ENCOUNTER — Ambulatory Visit: Payer: Self-pay | Admitting: Gastroenterology

## 2009-11-30 ENCOUNTER — Encounter: Payer: Self-pay | Admitting: Internal Medicine

## 2009-11-30 ENCOUNTER — Ambulatory Visit: Payer: Self-pay | Admitting: Gastroenterology

## 2009-11-30 LAB — COMPREHENSIVE METABOLIC PANEL
ALT: 16 U/L (ref 0–35)
AST: 18 U/L (ref 0–37)
Albumin: 3.8 g/dL (ref 3.5–5.2)
Alkaline Phosphatase: 70 U/L (ref 39–117)
BUN: 9 mg/dL (ref 6–23)
CO2: 24 mEq/L (ref 19–32)
Calcium: 9.1 mg/dL (ref 8.4–10.5)
Chloride: 96 mEq/L (ref 96–112)
Creatinine, Ser: 0.99 mg/dL (ref 0.40–1.20)
Glucose, Bld: 122 mg/dL — ABNORMAL HIGH (ref 70–99)
Potassium: 3.5 mEq/L (ref 3.5–5.3)
Sodium: 132 mEq/L — ABNORMAL LOW (ref 135–145)
Total Bilirubin: 0.5 mg/dL (ref 0.3–1.2)
Total Protein: 7 g/dL (ref 6.0–8.3)

## 2009-11-30 LAB — SPEP & IFE WITH QIG
Albumin ELP: 47 % — ABNORMAL LOW (ref 55.8–66.1)
Alpha-1-Globulin: 7.4 % — ABNORMAL HIGH (ref 2.9–4.9)
Alpha-2-Globulin: 16.3 % — ABNORMAL HIGH (ref 7.1–11.8)
Beta 2: 7 % — ABNORMAL HIGH (ref 3.2–6.5)
Beta Globulin: 7 % (ref 4.7–7.2)
Gamma Globulin: 15.3 % (ref 11.1–18.8)
IgA: 330 mg/dL (ref 68–378)
IgG (Immunoglobin G), Serum: 1290 mg/dL (ref 694–1618)
IgM, Serum: 127 mg/dL (ref 60–263)
Total Protein, Serum Electrophoresis: 7 g/dL (ref 6.0–8.3)

## 2009-11-30 LAB — ERYTHROPOIETIN: Erythropoietin: 100 m[IU]/mL — ABNORMAL HIGH (ref 2.6–34.0)

## 2009-11-30 LAB — IRON AND TIBC
%SAT: 14 % — ABNORMAL LOW (ref 20–55)
Iron: 40 ug/dL — ABNORMAL LOW (ref 42–145)
TIBC: 294 ug/dL (ref 250–470)
UIBC: 254 ug/dL

## 2009-11-30 LAB — FERRITIN: Ferritin: 130 ng/mL (ref 10–291)

## 2009-11-30 LAB — FOLATE: Folate: >20 ng/mL

## 2009-12-05 ENCOUNTER — Ambulatory Visit: Payer: Self-pay | Admitting: Internal Medicine

## 2009-12-07 ENCOUNTER — Encounter: Payer: Self-pay | Admitting: Internal Medicine

## 2009-12-07 ENCOUNTER — Telehealth: Payer: Self-pay | Admitting: Internal Medicine

## 2009-12-12 ENCOUNTER — Encounter: Payer: Self-pay | Admitting: Cardiology

## 2009-12-12 ENCOUNTER — Encounter: Payer: Self-pay | Admitting: Internal Medicine

## 2009-12-12 LAB — CONVERTED CEMR LAB
POC INR: 2.3
Prothrombin Time: 22.7 s

## 2009-12-17 ENCOUNTER — Telehealth: Payer: Self-pay | Admitting: Internal Medicine

## 2009-12-17 ENCOUNTER — Ambulatory Visit: Payer: Self-pay | Admitting: Internal Medicine

## 2009-12-17 LAB — CONVERTED CEMR LAB
Albumin ELP: 49.9 % — ABNORMAL LOW (ref 55.8–66.1)
Alpha-1-Globulin: 6.3 % — ABNORMAL HIGH (ref 2.9–4.9)
Alpha-2-Globulin: 14.8 % — ABNORMAL HIGH (ref 7.1–11.8)
Beta Globulin: 5.9 % (ref 4.7–7.2)
Gamma Globulin: 16.1 % (ref 11.1–18.8)
Total Protein, Serum Electrophoresis: 6.9 g/dL (ref 6.0–8.3)

## 2009-12-19 ENCOUNTER — Encounter: Payer: Self-pay | Admitting: Internal Medicine

## 2009-12-19 LAB — CONVERTED CEMR LAB
ALT: 13 units/L (ref 0–35)
AST: 23 units/L (ref 0–37)
Albumin: 3.4 g/dL — ABNORMAL LOW (ref 3.5–5.2)
Alkaline Phosphatase: 62 units/L (ref 39–117)
BUN: 9 mg/dL (ref 6–23)
Basophils Absolute: 0.1 10*3/uL (ref 0.0–0.1)
Basophils Relative: 1.2 % (ref 0.0–3.0)
Bilirubin, Direct: 0.2 mg/dL (ref 0.0–0.3)
CO2: 27 meq/L (ref 19–32)
Calcium: 9.2 mg/dL (ref 8.4–10.5)
Chloride: 100 meq/L (ref 96–112)
Creatinine, Ser: 0.9 mg/dL (ref 0.4–1.2)
Eosinophils Absolute: 0.1 10*3/uL (ref 0.0–0.7)
Eosinophils Relative: 2.1 % (ref 0.0–5.0)
GFR calc non Af Amer: 67.12 mL/min (ref >60)
Glucose, Bld: 131 mg/dL — ABNORMAL HIGH (ref 70–99)
HCT: 30.3 % — ABNORMAL LOW (ref 36.0–46.0)
Hemoglobin: 10.6 g/dL — ABNORMAL LOW (ref 12.0–15.0)
Iron: 91 ug/dL (ref 42–145)
Lymphocytes Relative: 17 % (ref 12.0–46.0)
Lymphs Abs: 0.8 10*3/uL (ref 0.7–4.0)
MCHC: 34.9 g/dL (ref 30.0–36.0)
MCV: 108.4 fL — ABNORMAL HIGH (ref 78.0–100.0)
Monocytes Absolute: 0.6 10*3/uL (ref 0.1–1.0)
Monocytes Relative: 12.5 % — ABNORMAL HIGH (ref 3.0–12.0)
Neutro Abs: 3 10*3/uL (ref 1.4–7.7)
Neutrophils Relative %: 67.2 % (ref 43.0–77.0)
Platelets: 328 10*3/uL (ref 150.0–400.0)
Potassium: 3.5 meq/L (ref 3.5–5.1)
RBC: 2.8 M/uL — ABNORMAL LOW (ref 3.87–5.11)
RDW: 17.2 % — ABNORMAL HIGH (ref 11.5–14.6)
Saturation Ratios: 29.4 % (ref 20.0–50.0)
Sed Rate: 78 mm/hr — ABNORMAL HIGH (ref 0–22)
Sodium: 137 meq/L (ref 135–145)
Total Bilirubin: 0.6 mg/dL (ref 0.3–1.2)
Total Protein: 6.6 g/dL (ref 6.0–8.3)
Transferrin: 221.3 mg/dL (ref 212.0–360.0)
Vitamin B-12: 828 pg/mL (ref 211–911)
WBC: 4.5 10*3/uL (ref 4.5–10.5)

## 2009-12-24 ENCOUNTER — Ambulatory Visit: Payer: Self-pay | Admitting: Internal Medicine

## 2010-01-02 ENCOUNTER — Ambulatory Visit: Payer: Self-pay | Admitting: Oncology

## 2010-01-02 ENCOUNTER — Encounter: Payer: Self-pay | Admitting: Internal Medicine

## 2010-01-04 ENCOUNTER — Encounter: Payer: Self-pay | Admitting: Internal Medicine

## 2010-01-04 LAB — CBC WITH DIFFERENTIAL/PLATELET
BASO%: 0.8 % (ref 0.0–2.0)
Basophils Absolute: 0 10*3/uL (ref 0.0–0.1)
EOS%: 2.8 % (ref 0.0–7.0)
Eosinophils Absolute: 0.1 10*3/uL (ref 0.0–0.5)
HCT: 35.7 % (ref 34.8–46.6)
HGB: 11.9 g/dL (ref 11.6–15.9)
LYMPH%: 25.4 % (ref 14.0–49.7)
MCH: 34.6 pg — ABNORMAL HIGH (ref 25.1–34.0)
MCHC: 33.3 g/dL (ref 31.5–36.0)
MCV: 104.2 fL — ABNORMAL HIGH (ref 79.5–101.0)
MONO#: 0.7 10*3/uL (ref 0.1–0.9)
MONO%: 12.9 % (ref 0.0–14.0)
NEUT#: 3 10*3/uL (ref 1.5–6.5)
NEUT%: 58.1 % (ref 38.4–76.8)
Platelets: 299 10*3/uL (ref 145–400)
RBC: 3.43 10*6/uL — ABNORMAL LOW (ref 3.70–5.45)
RDW: 17 % — ABNORMAL HIGH (ref 11.2–14.5)
WBC: 5.1 10*3/uL (ref 3.9–10.3)
lymph#: 1.3 10*3/uL (ref 0.9–3.3)

## 2010-01-10 ENCOUNTER — Ambulatory Visit: Payer: Self-pay | Admitting: Cardiology

## 2010-01-10 LAB — CONVERTED CEMR LAB: POC INR: 3

## 2010-01-22 ENCOUNTER — Telehealth: Payer: Self-pay | Admitting: Internal Medicine

## 2010-02-06 ENCOUNTER — Encounter
Admission: RE | Admit: 2010-02-06 | Discharge: 2010-02-19 | Payer: Self-pay | Source: Home / Self Care | Attending: Internal Medicine | Admitting: Internal Medicine

## 2010-02-07 ENCOUNTER — Ambulatory Visit: Admission: RE | Admit: 2010-02-07 | Discharge: 2010-02-07 | Payer: Self-pay | Source: Home / Self Care

## 2010-02-07 LAB — CONVERTED CEMR LAB: POC INR: 2.6

## 2010-02-13 ENCOUNTER — Encounter (INDEPENDENT_AMBULATORY_CARE_PROVIDER_SITE_OTHER): Payer: Self-pay | Admitting: *Deleted

## 2010-02-13 ENCOUNTER — Encounter: Payer: Self-pay | Admitting: Internal Medicine

## 2010-02-13 ENCOUNTER — Telehealth (INDEPENDENT_AMBULATORY_CARE_PROVIDER_SITE_OTHER): Payer: Self-pay | Admitting: *Deleted

## 2010-02-17 LAB — CONVERTED CEMR LAB
BUN: 44 mg/dL — ABNORMAL HIGH (ref 6–23)
Basophils Absolute: 0.1 10*3/uL (ref 0.0–0.1)
Basophils Relative: 1.8 % (ref 0.0–3.0)
CO2: 29 meq/L (ref 19–32)
Calcium: 10.1 mg/dL (ref 8.4–10.5)
Chloride: 98 meq/L (ref 96–112)
Creatinine, Ser: 2.3 mg/dL — ABNORMAL HIGH (ref 0.4–1.2)
Eosinophils Absolute: 0.2 10*3/uL (ref 0.0–0.7)
Eosinophils Relative: 4.3 % (ref 0.0–5.0)
Free T4: 0.9 ng/dL (ref 0.6–1.6)
GFR calc non Af Amer: 21.61 mL/min (ref >60)
Glucose, Bld: 131 mg/dL — ABNORMAL HIGH (ref 70–99)
HCT: 36.5 % (ref 36.0–46.0)
Hemoglobin: 12.4 g/dL (ref 12.0–15.0)
Lymphocytes Relative: 34.5 % (ref 12.0–46.0)
Lymphs Abs: 1.4 10*3/uL (ref 0.7–4.0)
MCHC: 33.9 g/dL (ref 30.0–36.0)
MCV: 110 fL — ABNORMAL HIGH (ref 78.0–100.0)
Monocytes Absolute: 0.6 10*3/uL (ref 0.1–1.0)
Monocytes Relative: 15.1 % — ABNORMAL HIGH (ref 3.0–12.0)
Neutro Abs: 1.8 10*3/uL (ref 1.4–7.7)
Neutrophils Relative %: 44.3 % (ref 43.0–77.0)
Platelets: 201 10*3/uL (ref 150.0–400.0)
Potassium: 3.4 meq/L — ABNORMAL LOW (ref 3.5–5.1)
RBC: 3.31 M/uL — ABNORMAL LOW (ref 3.87–5.11)
RDW: 12.1 % (ref 11.5–14.6)
Sodium: 139 meq/L (ref 135–145)
TSH: 0.64 microintl units/mL (ref 0.35–5.50)
WBC: 4.1 10*3/uL — ABNORMAL LOW (ref 4.5–10.5)

## 2010-02-18 ENCOUNTER — Ambulatory Visit: Admit: 2010-02-18 | Payer: Self-pay | Admitting: Internal Medicine

## 2010-02-19 ENCOUNTER — Encounter (INDEPENDENT_AMBULATORY_CARE_PROVIDER_SITE_OTHER): Payer: Self-pay | Admitting: *Deleted

## 2010-02-20 ENCOUNTER — Other Ambulatory Visit: Payer: Self-pay | Admitting: Internal Medicine

## 2010-02-20 ENCOUNTER — Encounter (INDEPENDENT_AMBULATORY_CARE_PROVIDER_SITE_OTHER): Payer: Self-pay | Admitting: *Deleted

## 2010-02-20 ENCOUNTER — Ambulatory Visit: Payer: Medicare Other | Attending: Internal Medicine | Admitting: Rehabilitation

## 2010-02-20 ENCOUNTER — Encounter: Payer: Self-pay | Admitting: Gastroenterology

## 2010-02-20 ENCOUNTER — Other Ambulatory Visit: Payer: Medicare Other

## 2010-02-20 DIAGNOSIS — IMO0001 Reserved for inherently not codable concepts without codable children: Secondary | ICD-10-CM | POA: Insufficient documentation

## 2010-02-20 DIAGNOSIS — R269 Unspecified abnormalities of gait and mobility: Secondary | ICD-10-CM | POA: Insufficient documentation

## 2010-02-20 DIAGNOSIS — R262 Difficulty in walking, not elsewhere classified: Secondary | ICD-10-CM | POA: Insufficient documentation

## 2010-02-20 DIAGNOSIS — M2550 Pain in unspecified joint: Secondary | ICD-10-CM | POA: Insufficient documentation

## 2010-02-20 DIAGNOSIS — M6281 Muscle weakness (generalized): Secondary | ICD-10-CM | POA: Insufficient documentation

## 2010-02-20 DIAGNOSIS — D509 Iron deficiency anemia, unspecified: Secondary | ICD-10-CM

## 2010-02-20 LAB — BASIC METABOLIC PANEL
BUN: 12 mg/dL (ref 6–23)
CO2: 31 mEq/L (ref 19–32)
Calcium: 9.6 mg/dL (ref 8.4–10.5)
Chloride: 97 mEq/L (ref 96–112)
Creatinine, Ser: 1 mg/dL (ref 0.4–1.2)
GFR: 59.09 mL/min — ABNORMAL LOW (ref >60.00)
Glucose, Bld: 88 mg/dL (ref 70–99)
Potassium: 3.8 mEq/L (ref 3.5–5.1)
Sodium: 135 mEq/L (ref 135–145)

## 2010-02-20 LAB — CBC WITH DIFFERENTIAL/PLATELET
Basophils Absolute: 0 10*3/uL (ref 0.0–0.1)
Basophils Relative: 0.4 % (ref 0.0–3.0)
Eosinophils Absolute: 0.1 10*3/uL (ref 0.0–0.7)
Eosinophils Relative: 2.1 % (ref 0.0–5.0)
HCT: 39.7 % (ref 36.0–46.0)
Hemoglobin: 13.4 g/dL (ref 12.0–15.0)
Lymphocytes Relative: 25.5 % (ref 12.0–46.0)
Lymphs Abs: 1.3 10*3/uL (ref 0.7–4.0)
MCHC: 33.8 g/dL (ref 30.0–36.0)
MCV: 104.6 fl — ABNORMAL HIGH (ref 78.0–100.0)
Monocytes Absolute: 0.6 10*3/uL (ref 0.1–1.0)
Monocytes Relative: 10.9 % (ref 3.0–12.0)
Neutro Abs: 3.2 10*3/uL (ref 1.4–7.7)
Neutrophils Relative %: 61.1 % (ref 43.0–77.0)
Platelets: 264 10*3/uL (ref 150.0–400.0)
RBC: 3.8 Mil/uL — ABNORMAL LOW (ref 3.87–5.11)
RDW: 15.4 % — ABNORMAL HIGH (ref 11.5–14.6)
WBC: 5.3 10*3/uL (ref 4.5–10.5)

## 2010-02-20 LAB — IBC PANEL
Iron: 70 ug/dL (ref 42–145)
Saturation Ratios: 19.7 % — ABNORMAL LOW (ref 20.0–50.0)
Transferrin: 253.6 mg/dL (ref 212.0–360.0)

## 2010-02-20 LAB — SEDIMENTATION RATE: Sed Rate: 30 mm/hr — ABNORMAL HIGH (ref 0–22)

## 2010-02-21 ENCOUNTER — Ambulatory Visit: Payer: Medicare Other | Admitting: Rehabilitation

## 2010-02-21 NOTE — Medication Information (Signed)
Summary: rov/sp  Anticoagulant Therapy  Managed by: Weston Brass, PharmD Referring MD: Verne Carrow PCP: Georgina Quint [name] MD Supervising MD: Riley Kill MD, Maisie Fus Indication 1: Atrial Fibrillation Lab Used: [facility] [place] Site: [address] INR POC 4.0 INR RANGE 2.0-3.0  Dietary changes: yes       Details: Pt had 3 glasses of wine last night  Health status changes: no    Bleeding/hemorrhagic complications: no    Recent/future hospitalizations: yes       Details: Pt has dark black spot on lower left leg.  [place] Spaniel it was a bruise but is now raised and still present. She has a derm appt next thursday.    Any changes in medication regimen? no    Recent/future dental: no  Any missed doses?: no       Is patient compliant with meds? yes       Allergies: 1)  ! Macrodantin (Nitrofurantoin Macrocrystal) 2)  ! Codeine Sulfate (Codeine Sulfate) 3)  ! Atenolol (Atenolol) 4)  Percocet (Oxycodone-Acetaminophen)  Anticoagulation Management History:      The patient is taking warfarin and comes in today for a routine follow up visit.  Positive risk factors for bleeding include an age of 17 years or older and presence of serious comorbidities.  The bleeding index is 'intermediate risk'.  Positive CHADS2 values include History of HTN and Age > 61 years old.  Her last INR was 2.6.  Anticoagulation responsible provider: Riley Kill MD, Maisie Fus.  INR POC: 4.0.  Cuvette Lot#: [id number].  Exp: 10/2010.    Anticoagulation Management Assessment/Plan:      The patient's current anticoagulation dose is Warfarin sodium 5 mg tabs: Use as directed by Anticoagulation Clinic.  The target INR is 2.0-3.0.  The next INR is due 09/28/2009.  Anticoagulation instructions were given to patient.  Results were reviewed/authorized by Weston Brass, PharmD.  She was notified by Gweneth Fritter, PharmD Candidate.         Prior Anticoagulation Instructions: INR 2.6  Continue same dose of 1 tablet every day  except 1/2 tablet on Tuesday and Thursday.   Current Anticoagulation Instructions: INR 4.0  Skip today, August 19th's dose.  Resume normal schedule of taking 1 tablet (5mg )  every day except take 1/2 tablet (2.5mg ) on Tuesdays and Thursdays.

## 2010-02-21 NOTE — Miscellaneous (Signed)
Summary: PT Order/[facility]  PT Order/[facility]   Imported By: Lester [name] 12/20/2009 10:59:29  _____________________________________________________________________  External Attachment:    Type:   Image     Comment:   External Document

## 2010-02-21 NOTE — Progress Notes (Signed)
Summary: request to add TSH  Phone Note Call from Patient Call back at Home Phone [phone]   Caller: Angelique Blonder - [phone] -- Care Evlyn Kanner Call For: Tresa Garter MD Summary of Call: Care Ev[facility] Kanner / Angelique Blonder @[phone] requesting order for TSH added to labs drawn this am.   Ok to add ?? Please let Angelique Blonder know.  Initial call taken by: Verdell Face,  November 21, 2009 9:24 AM  Follow-up for Phone Call        ok TSH 780.79 Follow-up by: Tresa Garter MD,  November 21, 2009 1:17 PM  Additional Follow-up for Phone Call Additional follow up Details #1::        [facility] informed Additional Follow-up by: Brenton Grills CMA Duncan Dull),  November 21, 2009 1:38 PM

## 2010-02-21 NOTE — Progress Notes (Signed)
Summary: [facility]  Phone Note Call from Patient Call back at Home Phone [phone]   Caller: Marian Sorrow  / [facility] [phone] Call For: Tresa Garter MD Summary of Call: Needs order for social worker, pt needs to get into assisted living center. Daughter only here till Saturday. Initial call taken by: Verdell Face,  November 26, 2009 10:30 AM  Follow-up for Phone Call        Gave verbal ok for social wker to [employer] [facility].  Follow-up by: Lamar Sprinkles, CMA,  November 26, 2009 12:12 PM  Additional Follow-up for Phone Call Additional follow up Details #1::        ok  Thank you!  Additional Follow-up by: Tresa Garter MD,  November 28, 2009 7:52 AM     Appended Document: [facility] Waynetta Sandy called again stating that we did not call this order in, I called and gave another verbal ok to care [facility]

## 2010-02-21 NOTE — Progress Notes (Signed)
Summary: refill  Phone Note Refill Request Message from:  Patient on Jun 12, 2009 9:20 AM  Refills Requested: Medication #1:  METOPROLOL TARTRATE 50 MG TABS Take one tablet by mouth twice a day [facility] [address] [phone]  Initial call taken by: Judie Grieve,  Jun 12, 2009 9:20 AM Caller: Patient    New/Updated Medications: METOPROLOL SUCCINATE 50 MG XR24H-TAB (METOPROLOL SUCCINATE) 1 tab two times a day Prescriptions: METOPROLOL SUCCINATE 50 MG XR24H-TAB (METOPROLOL SUCCINATE) 1 tab two times a day  #60 x 11   Entered by:   Danielle Rankin, CMA   Authorized by:   Verne Carrow, MD   Signed by:   Danielle Rankin, CMA on 06/12/2009   Method used:   Telephoned to ...       [facility] [address] [phone]* (retail)       [address]       [place], [place]  [address]       Ph: [phone] or [phone]       Fax: [phone]   RxID:   [id number]

## 2010-02-21 NOTE — Medication Information (Signed)
Summary: rov/eac  Anticoagulant Therapy  Managed by: Cloyde Reams, RN, BSN Referring MD: Verne Carrow PCP: Tresa Garter MD Supervising MD: Eden Emms MD, Theron Arista Indication 1: Atrial Fibrillation Lab Used: [facility] Site: [address] INR POC 1.9 INR RANGE 2.0-3.0  Dietary changes: no    Health status changes: no    Bleeding/hemorrhagic complications: no    Recent/future hospitalizations: no    Any changes in medication regimen? no    Recent/future dental: no  Any missed doses?: yes     Details: May have missed 1/2 tablet on Friday.    Is patient compliant with meds? yes       Allergies: 1)  ! Macrodantin (Nitrofurantoin Macrocrystal) 2)  ! Codeine Sulfate (Codeine Sulfate) 3)  ! Atenolol (Atenolol) 4)  Percocet (Oxycodone-Acetaminophen)  Anticoagulation Management History:      Positive risk factors for bleeding include an age of 75 years or older and presence of serious comorbidities.  The bleeding index is 'intermediate risk'.  Positive CHADS2 values include History of HTN and Age > 75 years old.  Her last INR was 2.6.  Anticoagulation responsible provider: Eden Emms MD, Theron Arista.  INR POC: 1.9.  Exp: 05/2010.    Anticoagulation Management Assessment/Plan:      The patient's current anticoagulation dose is Warfarin sodium 2.5 mg tabs: take one tablet daily.  The target INR is 2.0-3.0.  The next INR is due 05/09/2009.  Anticoagulation instructions were given to patient.  Results were reviewed/authorized by Cloyde Reams, RN, BSN.  She was notified by Cloyde Reams RN.         Prior Anticoagulation Instructions: INR 1.9  Start NEW dosing schedule of 1 tablet on Monday, Wednesday, and Friday, and take 1/2 tablet all other days.  Return to clinic in 3 weeks.   Current Anticoagulation Instructions: INR 1.9  Take 1.5 tablets today then resume same dosage 1/2 tablet daily except 1 tablet on Mondays, Wednesdays, and Fridays.  Recheck in 3 weeks.

## 2010-02-21 NOTE — Procedures (Signed)
Summary: Upper Endoscopy  Patient: Lyanne Kates Note: All result statuses are Final unless otherwise noted.  Tests: (1) Upper Endoscopy (EGD)   EGD Upper Endoscopy       DONE     [facility]     [address]     [place], [place]  [address]           ENDOSCOPY PROCEDURE REPORT     PATIENT:  Shulamis, Wenberg  MR#:  [id number]     BIRTHDATE:  Jan 05, 1926, 83 yrs. old  GENDER:  female     ENDOSCOPIST:  Rachael Fee, MD     Referred by:  Linda Hedges [name], M.D.     PROCEDURE DATE:  10/29/2009     PROCEDURE:  EGD with biopsy     ASA CLASS:  Class III     INDICATIONS:  recent melena, anemia (recently taking steroids,     NSAIDs and takes coumadin for afib).  Last BM 2-3 days ago.     MEDICATIONS:  Fentanyl 25 mcg IV, Versed 4 mg IV     TOPICAL ANESTHETIC:  Exactacain Spray     DESCRIPTION OF PROCEDURE:   After the risks benefits and     alternatives of the procedure were thoroughly explained, informed     consent was obtained.  The [facility] [id number] endoscope was     introduced through the mouth and advanced to the second portion of     the duodenum, without limitations.  The instrument was slowly     withdrawn as the mucosa was fully examined.     <<PROCEDUREIMAGES>>     There were 4-5 small, clean based ulcers in gastric antrum. There     was no recent or active bleeding. The surrounding mucosa in most     of the stomach was mildly inflammed. Biopsies were taken to check     for H. pylori and sent to pathology (jar 1) (see image2, image7,     and image5).  Otherwise the examination was normal (see image8,     image3, and image1).    Retroflexed views revealed no     abnormalities.    The scope was then withdrawn from the patient     and the procedure completed.     COMPLICATIONS:  None     ENDOSCOPIC IMPRESSION:     1) Several small, clean based distal gastric ulcers (likely     source of melena). Biopsies taken to check for H. pylori.     2) Otherwise normal  examination     RECOMMENDATIONS:     Stay on daily PPI (once a day is OK for now).  Please pick up     OTC prilosec and stay on one a day for as long as you require     aspirin.     Start once daily OTC iron supplement.     Dr. Christella Hartigan' office will call you to arrange repeat labs (CBC) in     4-5 weeks and a follow up appt shortly afterwards.  Will also need     repeat EGD at some point to confirm healing.           _____________________________     Rachael Fee, MD           n.     eSIGNED:   Rachael Fee at 10/29/2009 02:20 PM           Lepp, Windell Moulding, [phone]  Note: An exclamation mark (!) indicates a result that was not dispersed into the flowsheet. Document Creation Date: 10/29/2009 2:21 PM _______________________________________________________________________  (1) Order result status: Final Collection or observation date-time: 10/29/2009 14:11 Requested date-time:  Receipt date-time:  Reported date-time:  Referring Physician:   Ordering Physician: Rob Bunting [phone]) Specimen Source:  Source: Launa Grill Order Number: [id number] Lab site:   Appended Document: Upper Endoscopy told her ok to restart coumadin and asa in 5 days.  Appended Document: Upper Endoscopy letter mailed with ROV and Lab appt

## 2010-02-21 NOTE — Letter (Signed)
Summary: [facility]  [facility]   Imported By: Sherian Rein 06/27/2009 07:46:19  _____________________________________________________________________  External Attachment:    Type:   Image     Comment:   External Document

## 2010-02-21 NOTE — Letter (Signed)
Summary: EGD Instructions  [facility]  [address] [place], [place] [address]   Phone: [phone]  Fax: [phone]       [name]    22-Dec-1925    MRN: [id number]       Procedure Day /Date:10/29/09  MON     Arrival Time: 1 pm     Procedure Time:2 pm     Location of Procedure:                    [facility] (4th Floor)   PREPARATION FOR ENDOSCOPY   On10/10/11THE DAY OF THE PROCEDURE:  1.   No solid foods, milk or milk products are allowed after midnight the night before your procedure.  2.   Do not drink anything colored red or purple.  Avoid juices with pulp.  No orange juice.  3.  You may drink clear liquids until12 noon, which is 2 hours before your procedure.                                                                                                CLEAR LIQUIDS INCLUDE: Water Jello Ice Popsicles Tea (sugar ok, no milk/cream) Powdered fruit flavored drinks Coffee (sugar ok, no milk/cream) Gatorade Juice: apple, white grape, white cranberry  Lemonade Clear bullion, consomm, broth Carbonated beverages (any kind) Strained chicken noodle soup Hard Candy   MEDICATION INSTRUCTIONS  Unless otherwise instructed, you should take regular prescription medications with a small sip of water as early as possible the morning of your procedure.  Diabetic patients - see separate instructions.       Stop Coumadin on  today per Dr Christella Hartigan             OTHER INSTRUCTIONS  You will need a responsible adult at least 75 years of age to accompany you and drive you home.   This person must remain in the waiting room during your procedure.  Wear loose fitting clothing that is easily removed.  Leave jewelry and other valuables at home.  However, you may wish to bring a book to read or an iPod/MP3 player to listen to music as you wait for your procedure to start.  Remove all body piercing jewelry and leave at home.  Total time  from sign-in until discharge is approximately 2-3 hours.  You should go home directly after your procedure and rest.  You can resume normal activities the day after your procedure.  The day of your procedure you should not:   Drive   Make legal decisions   Operate machinery   Drink alcohol   Return to work  You will receive specific instructions about eating, activities and medications before you leave.    The above instructions have been reviewed and explained to me by   _______________________    I fully understand and can verbalize these instructions _____________________________ Date _________

## 2010-02-21 NOTE — Letter (Signed)
Summary: [name]   Imported By: Marylou [name] 01/17/2009 10:45:16  _____________________________________________________________________  External Attachment:    Type:   Image     Comment:   External Document

## 2010-02-21 NOTE — Progress Notes (Signed)
Summary: temazepam  Phone Note Call from Patient   Details of Action Taken: will refill temazepam x 6 will fax to pharmacy./vg Summary of Call: Patient is requesting a refill on med, says she req thru pharm. No electronic requests need to call pt. Initial call taken by: Lamar Sprinkles,  November 10, 2007 4:36 PM      Prescriptions: TEMAZEPAM 30 MG  CAPS (TEMAZEPAM) Take 1 tablet by mouth once a day  #30 x 6   Entered by:   Tora Perches   Authorized by:   Tresa Garter MD   Signed by:   Tora Perches on 11/11/2007   Method used:   Telephoned to ...       [facility] [address] [phone]* (retail)       [address]       [place], [place]  [address]       Ph: [phone] or [phone]       Fax: [phone]   RxID:   [id number]

## 2010-02-21 NOTE — Medication Information (Signed)
Summary: rov/tm  Anticoagulant Therapy  Managed by: Weston Brass, PharmD Referring MD: Verne Carrow PCP: Tresa Garter MD Supervising MD: Tenny Craw MD, Gunnar Fusi Indication 1: Atrial Fibrillation Lab Used: [facility] [place] Site: [address] INR POC 1.9 INR RANGE 2.0-3.0  Dietary changes: no    Health status changes: no    Bleeding/hemorrhagic complications: no    Recent/future hospitalizations: no    Any changes in medication regimen? no    Recent/future dental: no  Any missed doses?: no       Is patient compliant with meds? yes       Allergies: 1)  ! Macrodantin (Nitrofurantoin Macrocrystal) 2)  ! Codeine Sulfate (Codeine Sulfate) 3)  ! Atenolol (Atenolol) 4)  Percocet (Oxycodone-Acetaminophen)  Anticoagulation Management History:      The patient is taking warfarin and comes in today for a routine follow up visit.  Positive risk factors for bleeding include an age of 16 years or older and presence of serious comorbidities.  The bleeding index is 'intermediate risk'.  Positive CHADS2 values include History of HTN and Age > 55 years old.  Her last INR was 2.6.  Anticoagulation responsible provider: Tenny Craw MD, Gunnar Fusi.  INR POC: 1.9.  Cuvette Lot#: [id number].  Exp: 06/2010.    Anticoagulation Management Assessment/Plan:      The patient's current anticoagulation dose is Warfarin sodium 2.5 mg tabs: take one tablet daily.  The target INR is 2.0-3.0.  The next INR is due 06/14/2009.  Anticoagulation instructions were given to patient.  Results were reviewed/authorized by Weston Brass, PharmD.  She was notified by Weston Brass PharmD.         Prior Anticoagulation Instructions: INR 2.0 Change dose 5mg s daily except  2.5mg s on Tuesdays, Thursdays and Saturdays. Recheck in 2 weeks.   Current Anticoagulation Instructions: INR 1.9  Increase dose to 1 tablet every day except 1/2 tablet on Tuesday and Thursday

## 2010-02-21 NOTE — Letter (Signed)
Summary: [facility]  [facility]   Imported By: Lennie Odor 08/28/2009 11:12:47  _____________________________________________________________________  External Attachment:    Type:   Image     Comment:   External Document

## 2010-02-21 NOTE — Assessment & Plan Note (Signed)
Summary: follow up-lb   Vital Signs:  Patient profile:   77 year old female BP sitting:   114 / 70 CC: F/U--Pt states that she is "doing good" and no symptoms or complaints./kb Is Patient Diabetic? No Pain Assessment Patient in pain? no        Primary Care Provider:  Tresa Garter MD  CC:  F/U--Pt states that she is "doing good" and no symptoms or complaints./kb.  History of Present Illness: The patient presents for a follow up of hypertension, diabetes, LBP, CRI   Current Medications (verified): 1)  Temazepam 30 Mg  Caps (Temazepam) .... Take 1 Tablet By Mouth Once A Day 2)  Triamterene-Hctz 75-50 Mg  Tabs (Triamterene-Hctz) .... Take 1 Tablet By Mouth Once A Day 3)  Benazepril Hcl 20 Mg  Tabs (Benazepril Hcl) .[name].. 1-2 By Mouth Daily 4)  Amlodipine Besylate 5 Mg  Tabs (Amlodipine Besylate) .... 1/2 By Mouth Once Daily 5)  Metoprolol Tartrate 50 Mg Tabs (Metoprolol Tartrate) .... Take One Tablet By Mouth Twice A Day 6)  Warfarin Sodium 2.5 Mg Tabs (Warfarin Sodium) .... Take One Tablet Daily 7)  Oxycodone-Acetaminophen 5-325 Mg Tabs (Oxycodone-Acetaminophen) .... As Needed 8)  Furosemide 40 Mg Tabs (Furosemide) .[name].. 1 By Mouth Every Other Day As Needed Swelling 9)  Vitamin D3 1000 Unit  Tabs (Cholecalciferol) .[name].. 1 Qd 10)  Aspirin 81 Mg Tbec (Aspirin) .... Take One Tablet By Mouth Daily 11)  Vitamin B Complex   Caps (B Complex Vitamins) .... Take 1 Tablet By Mouth Once A Day  Allergies (verified): 1)  ! Macrodantin (Nitrofurantoin Macrocrystal) 2)  ! Codeine Sulfate (Codeine Sulfate) 3)  ! Atenolol (Atenolol) 4)  Percocet (Oxycodone-Acetaminophen)  Review of Systems  The patient denies fever, chest pain, abdominal pain, and difficulty walking.    Physical Exam  General:  General: Well developed, well nourished, NAD HEENT: OP clear, mucus membranes moist SKIN: warm, dry Neuro: No focal deficits Musculoskeletal: Muscle strength 5/5 all ext Psychiatric: Mood  and affect normal Neck: No JVD, no carotid bruits, no thyromegaly, no lymphadenopathy. Lungs:Clear bilaterally, no wheezes, rhonci, crackles CV: RRR no murmurs, gallops rubs Abdomen: soft, NT, ND, BS present   Nose:  External nasal examination shows no deformity or inflammation. Nasal mucosa are pink and moist without lesions or exudates. Mouth:  Oral mucosa and oropharynx without lesions or exudates.  Teeth in good repair. Lungs:  Normal respiratory effort, chest expands symmetrically. Lungs are clear to auscultation, no crackles or wheezes. Heart:  Normal rate and regular rhythm. S1 and S2 normal without gallop, murmur, click, rub or other extra sounds. Abdomen:  Bowel sounds positive,abdomen soft and non-tender without masses, organomegaly or hernias noted. Msk:  Using a cane LS in a brace Neurologic:  alert & oriented X3.   Skin:  no cellulitis Psych:  Oriented X3.     Impression & Recommendations:  Problem # 1:  LOW BACK PAIN (ICD-724.2) Assessment Improved  The following medications were removed from the medication list:    Oxycodone-acetaminophen 5-325 Mg Tabs (Oxycodone-acetaminophen) .[name]... As needed Tramadol prn  Problem # 2:  RENAL INSUFFICIENCY (ICD-588.9) Assessment: Improved The labs were reviewed with the patient.   Problem # 3:  ATRIAL FIBRILLATION (ICD-427.31) Assessment: Unchanged  Her updated medication list for this problem includes:    Amlodipine Besylate 5 Mg Tabs (Amlodipine besylate) .[name]... 1/2 by mouth once daily    Metoprolol Tartrate 50 Mg Tabs (Metoprolol tartrate) .[name]... Take one tablet by mouth twice  a day    Warfarin Sodium 2.5 Mg Tabs (Warfarin sodium) .[name]... Take one tablet daily    Aspirin 81 Mg Tbec (Aspirin) .[name]... Take one tablet by mouth daily  Problem # 4:  HYPERTENSION (ICD-401.9) Assessment: Unchanged  Her updated medication list for this problem includes:    Triamterene-hctz 75-50 Mg Tabs (Triamterene-hctz) .[name]... Take 1 tablet by mouth  once a day    Benazepril Hcl 20 Mg Tabs (Benazepril hcl) .[name]... 1-2 by mouth daily    Amlodipine Besylate 5 Mg Tabs (Amlodipine besylate) .[name]... 1/2 by mouth once daily    Metoprolol Tartrate 50 Mg Tabs (Metoprolol tartrate) .[name]... Take one tablet by mouth twice a day    Furosemide 40 Mg Tabs (Furosemide) .[name]... 1 by mouth every other day as needed swelling  Prior BP: 100/54 (03/19/2009)  Labs Reviewed: K+: 4.1 (04/26/2009) Creat: : 1.1 (04/26/2009)   Chol: 153 (02/06/2009)   HDL: 54.10 (02/06/2009)   LDL: 87 (02/06/2009)   TG: 60.0 (02/06/2009)  Complete Medication List: 1)  Temazepam 30 Mg Caps (Temazepam) .... Take 1 tablet by mouth once a day 2)  Triamterene-hctz 75-50 Mg Tabs (Triamterene-hctz) .... Take 1 tablet by mouth once a day 3)  Benazepril Hcl 20 Mg Tabs (Benazepril hcl) .[name].. 1-2 by mouth daily 4)  Amlodipine Besylate 5 Mg Tabs (Amlodipine besylate) .... 1/2 by mouth once daily 5)  Metoprolol Tartrate 50 Mg Tabs (Metoprolol tartrate) .... Take one tablet by mouth twice a day 6)  Warfarin Sodium 2.5 Mg Tabs (Warfarin sodium) .... Take one tablet daily 7)  Furosemide 40 Mg Tabs (Furosemide) .[name].. 1 by mouth every other day as needed swelling 8)  Vitamin D3 1000 Unit Tabs (Cholecalciferol) .[name].. 1 qd 9)  Aspirin 81 Mg Tbec (Aspirin) .... Take one tablet by mouth daily 10)  Vitamin B Complex Caps (B complex vitamins) .... Take 1 tablet by mouth once a day 11)  Tramadol Hcl 50 Mg Tabs (Tramadol hcl) .[name].. 1-2 tabs by mouth two times a day as needed pain  Patient Instructions: 1)  Please schedule a follow-up appointment in 3 months. 2)  BMP prior to visit, ICD-9: 3)  TSH prior to visit, ICD-9:401.1

## 2010-02-21 NOTE — Medication Information (Signed)
Summary: rov/sp  Anticoagulant Therapy  Managed by: Weston Brass, PharmD Referring MD: Verne Carrow PCP: Tresa Garter MD Supervising MD: Daleen Squibb MD, Maisie Fus Indication 1: Atrial Fibrillation [facility] Site: [address] INR POC 2.6 INR RANGE 2.0-3.0  Dietary changes: no    Health status changes: no    Bleeding/hemorrhagic complications: no    Recent/future hospitalizations: yes       Details: SCHEDULED FOR BACK SURGERY 12/14  Any changes in medication regimen? no    Recent/future dental: no  Any missed doses?: no       Is patient compliant with meds? yes       Current Medications (verified): 1)  Celebrex 200 Mg  Caps (Celecoxib) .... Once Daily 2)  Temazepam 30 Mg  Caps (Temazepam) .... Take 1 Tablet By Mouth Once A Day 3)  Triamterene-Hctz 75-50 Mg  Tabs (Triamterene-Hctz) .... Take 1 Tablet By Mouth Once A Day 4)  Vitamin B Complex   Caps (B Complex Vitamins) .... Take 1 Tablet By Mouth Once A Day 5)  Vitamin D3 1000 Unit  Tabs (Cholecalciferol) .[name].. 1 Qd 6)  Benazepril Hcl 20 Mg  Tabs (Benazepril Hcl) .... 2 By Mouth Daily 7)  Amlodipine Besylate 5 Mg  Tabs (Amlodipine Besylate) .[name].. 1 By Mouth Qd 8)  Aspirin 81 Mg Tbec (Aspirin) .... Take One Tablet By Mouth Daily 9)  Toprol Xl 25 Mg Xr24h-Tab (Metoprolol Succinate) .... Take 1 Tablet Two Times A Day 10)  Tramadol Hcl 50 Mg  Tabs (Tramadol Hcl) .... As Needed 11)  Warfarin Sodium 5 Mg Tabs (Warfarin Sodium) .... Take 1 Tablet Daily  Allergies (verified): 1)  ! Macrodantin (Nitrofurantoin Macrocrystal) 2)  ! Codeine Sulfate (Codeine Sulfate) 3)  ! Atenolol (Atenolol) 4)  Percocet (Oxycodone-Acetaminophen)  Anticoagulation Management History:      The patient is taking warfarin and comes in today for a routine follow up visit.  Positive risk factors for bleeding include an age of 75 years or older.  The bleeding index is 'intermediate risk'.  Positive CHADS2 values include History of HTN and Age > 63 years  old.  Today's INR is 2.6.  Anticoagulation responsible provider: Daleen Squibb MD, Maisie Fus.  INR POC: 2.6.  Cuvette Lot#: [id number].  Exp: 02/2010.    Anticoagulation Management Assessment/Plan:      The patient's current anticoagulation dose is Warfarin sodium 5 mg tabs: take 1 tablet daily.  The target INR is 2.0-3.0.  The next INR is due 01/15/2009.  Anticoagulation instructions were given to patient.  Results were reviewed/authorized by Weston Brass, PharmD.  She was notified by L.         Prior Anticoagulation Instructions: INR 2.5  Continue same dose of 1/2 tablet every day except 1 tablet on Monday, Wednesday and Friday   Current Anticoagulation Instructions: INR 2.6  CONTINUE TAKING 0.5 TABLETS EVERYDAY EXCEPT TAKE 1 TABLET ON MONDAYS, WEDNESDAYS, AND FRIDAYS.  SHOULD HOLD COUMADIN FOR 5-7 DAYS BEFORE SURGERY.  CALL AFTER SURGERY TO LET us KNOW YOUR PLANS - WHETHER TO GO HOME OR TO REHAB.

## 2010-02-21 NOTE — Medication Information (Signed)
Summary: rov/kmw  Anticoagulant Therapy  Managed by: Weston Brass, PharmD Referring MD: Verne Carrow PCP: Tresa Garter MD Supervising MD: Juanda Chance MD, Medora Roorda Indication 1: Atrial Fibrillation INR POC 2.5 INR RANGE 2.0-3.0  Dietary changes: no    Health status changes: no    Bleeding/hemorrhagic complications: yes       Details: some slightly bleeding from rectum after straining- has resolved  Recent/future hospitalizations: yes       Details: having back surgery in December   Any changes in medication regimen? no    Recent/future dental: no  Any missed doses?: no       Is patient compliant with meds? yes       Allergies: 1)  ! Macrodantin (Nitrofurantoin Macrocrystal) 2)  ! Codeine Sulfate (Codeine Sulfate) 3)  ! Atenolol (Atenolol) 4)  Percocet (Oxycodone-Acetaminophen)  Anticoagulation Management History:      The patient is taking warfarin and comes in today for a routine follow up visit.  Positive risk factors for bleeding include an age of 75 years or older.  The bleeding index is 'intermediate risk'.  Positive CHADS2 values include History of HTN and Age > 43 years old.  Anticoagulation responsible provider: Juanda Chance MD, Smitty Cords.  INR POC: 2.5.  Cuvette Lot#: [id number].  Exp: 10/2009.    Anticoagulation Management Assessment/Plan:      The patient's current anticoagulation dose is Warfarin sodium 5 mg tabs: take 1 tablet daily.  The target INR is 2.0-3.0.  The next INR is due 12/18/2008.  Anticoagulation instructions were given to patient.  Results were reviewed/authorized by Weston Brass, PharmD.  She was notified by Weston Brass PharmD.         Prior Anticoagulation Instructions: INR 3.6  Hold today's dose only (11/23/2008), then take 1/2 tablet every day except on Monday, Wednesday and Friday take 1 tablet. Recheck INR on November 15.  Current Anticoagulation Instructions: INR 2.5  Continue same dose of 1/2 tablet every day except 1 tablet on Monday, Wednesday  and Friday

## 2010-02-21 NOTE — Medication Information (Signed)
Summary: [facility]  Anticoagulant Therapy  Managed by: Bethena Midget, RN, BSN Referring MD: Verne Carrow PCP: Tresa Garter MD Supervising MD: Ladona Ridgel MD, Sharlot Gowda Indication 1: Atrial Fibrillation Lab Used: [facility] Site: [address] PT 35.0 INR POC 3.5 INR RANGE 2.0-3.0  Dietary changes: no    Health status changes: no    Bleeding/hemorrhagic complications: no    Recent/future hospitalizations: yes       Details: Recently in [place]. [facility] was back Sx came home on 01/10/09.  Any changes in medication regimen? no    Recent/future dental: no  Any missed doses?: no       Is patient compliant with meds? yes      Comments: Per pt. no changes.   Allergies: 1)  ! Macrodantin (Nitrofurantoin Macrocrystal) 2)  ! Codeine Sulfate (Codeine Sulfate) 3)  ! Atenolol (Atenolol) 4)  Percocet (Oxycodone-Acetaminophen)  Anticoagulation Management History:      Her anticoagulation is being managed by telephone today.  Positive risk factors for bleeding include an age of 75 years or older.  The bleeding index is 'intermediate risk'.  Positive CHADS2 values include History of HTN and Age > 10 years old.  Her last INR was 2.6.  Prothrombin time is 35.0.  Anticoagulation responsible provider: Ladona Ridgel MD, Sharlot Gowda.  INR POC: 3.5.    Anticoagulation Management Assessment/Plan:      The patient's current anticoagulation dose is Warfarin sodium 5 mg tabs: take 1 tablet daily.  The target INR is 2.0-3.0.  The next INR is due 02/12/2009.  Anticoagulation instructions were given to patient.  Results were reviewed/authorized by Bethena Midget, RN, BSN.  She was notified by Bethena Midget, RN, BSN.         Prior Anticoagulation Instructions: INR 2.5 Continue 2.5mg s daily except 5mg s on MWF. Recheck in 2 weeks.  Orders faxed to [facility].   Current Anticoagulation Instructions: INR 3.5  Attempted to call pt, LMOM TCB for results. Bethena Midget, RN, BSN  February 01, 2009 1:29 PM  02/01/09  @217pm - Pt returned call, skip coumadin today. Then change dose to 2.5mg s daily except 5mg s on Mondays and Fridays.  Recheck on 02/12/09, orders sent to [facility]

## 2010-02-21 NOTE — Consult Note (Signed)
Summary: [facility]   Imported By: Sherian Rein 11/28/2008 08:48:18  _____________________________________________________________________  External Attachment:    Type:   Image     Comment:   External Document

## 2010-02-21 NOTE — Consult Note (Signed)
[facility]   Imported By: Roderic Ovens 01/08/2009 13:12:27  _____________________________________________________________________  External Attachment:    Type:   Image     Comment:   External Document

## 2010-02-21 NOTE — Medication Information (Signed)
Summary: [facility]ician[facility] Orders   [facility] Orders   Imported By: Roderic Ovens 11/30/2009 11:00:42  _____________________________________________________________________  External Attachment:    Type:   Image     Comment:   External Document

## 2010-02-21 NOTE — Assessment & Plan Note (Signed)
Summary: 3 MO ROV/NWS   Vital Signs:  Patient Profile:   77 Years Old Female Weight:      164 pounds Temp:     97 degrees F oral Pulse rate:   75 / minute BP sitting:   136 / 74  (left arm)  Vitals Entered By: Tora Perches (Jun 01, 2007 10:44 AM)                 Chief Complaint:  Multiple medical problems or concerns.  History of Present Illness: The patient presents for a follow up of hypertension, LBP, OA     Current Allergies (reviewed today): ! MACRODANTIN (NITROFURANTOIN MACROCRYSTAL) ! CODEINE SULFATE (CODEINE SULFATE) ! ATENOLOL (ATENOLOL)  Past Medical History:    Reviewed history from 03/02/2007 and no changes required:       Hypertension       Low back pain, spinal stenosis       Osteoarthritis       Osteopenia       Elevated mcv 790.09       Constipation  564.00Iischemic colitis with rectal bleeding  558.9       Borderline low B12    Family History:    Reviewed history from 03/02/2007 and no changes required:       Family History Hypertension  Social History:    Reviewed history from 03/02/2007 and no changes required:       Never Smoked       Retired       Married    Review of Systems  The patient denies fever, weight loss, chest pain, prolonged cough, abdominal pain, and hematuria.         Occ cough   Physical Exam  General:     Well-developed,well-nourished,in no acute distress; alert,appropriate and cooperative throughout examination Ears:     External ear exam shows no significant lesions or deformities.  Otoscopic examination reveals clear canals, tympanic membranes are intact bilaterally without bulging, retraction, inflammation or discharge. Hearing is grossly normal bilaterally. Nose:     External nasal examination shows no deformity or inflammation. Nasal mucosa are pink and moist without lesions or exudates. Mouth:     Oral mucosa and oropharynx without lesions or exudates.  Teeth in good repair. Neck:     No deformities,  masses, or tenderness noted. Lungs:     Normal respiratory effort, chest expands symmetrically. Lungs are clear to auscultation, no crackles or wheezes. Heart:     Normal rate and regular rhythm. S1 and S2 normal without gallop, murmur, click, rub or other extra sounds. Abdomen:     Bowel sounds positive,abdomen soft and non-tender without masses, organomegaly or hernias noted. Msk:     Lumbar-sacral spine is tender to palpation over paraspinal muscles and painfull with the ROM  Neurologic:     No cranial nerve deficits noted. Station and gait are normal. Plantar reflexes are down-going bilaterally. DTRs are symmetrical throughout. Sensory, motor and coordinative functions appear intact. Skin:     Intact without suspicious lesions or rashes Psych:     Cognition and judgment appear intact. Alert and cooperative with normal attention span and concentration. No apparent delusions, illusions, hallucinations    Impression & Recommendations:  Problem # 1:  LOW BACK PAIN (ICD-724.2) Assessment: Unchanged Chronic Her updated medication list for this problem includes:    Celebrex 200 Mg Caps (Celecoxib) ..... Once daily   Problem # 2:  HYPERTENSION (ICD-401.9) Assessment: Improved  Her updated medication list  for this problem includes:    Lotrel 5-40 Mg Caps (Amlodipine besy-benazepril hcl) .[name]... 1 by mouth qd    Triamterene-hctz 75-50 Mg Tabs (Triamterene-hctz) .[name]... Take 1 tablet by mouth once a day   Problem # 3:  OSTEOPENIA (ICD-733.90) Assessment: Unchanged  Her updated medication list for this problem includes:    Vitamin D3 1000 Unit Tabs (Cholecalciferol) .[name]... 1 qd   Problem # 4:  OSTEOARTHRITIS (ICD-715.90) Assessment: Unchanged  Her updated medication list for this problem includes:    Celebrex 200 Mg Caps (Celecoxib) ..... Once daily   Complete Medication List: 1)  Lotrel 5-40 Mg Caps (Amlodipine besy-benazepril hcl) .[name].. 1 by mouth qd 2)  Celebrex 200 Mg Caps  (Celecoxib) .... Once daily 3)  Temazepam 30 Mg Caps (Temazepam) .... Take 1 tablet by mouth once a day 4)  Triamterene-hctz 75-50 Mg Tabs (Triamterene-hctz) .... Take 1 tablet by mouth once a day 5)  Avelox 400 Mg Tabs (Moxifloxacin hcl) .... One by mouth once daily 6)  Albuterol Sulfate 2 Mg Tabs (Albuterol sulfate) .[name].. 1 two times a day x 5 days as needed wheezing 7)  Vitamin B Complex Caps (B complex vitamins) .... Take 1 tablet by mouth once a day 8)  Vitamin D3 1000 Unit Tabs (Cholecalciferol) .[name].. 1 qd   Patient Instructions: 1)  Please schedule a follow-up appointment in 4 months. r  ]

## 2010-02-21 NOTE — Medication Information (Signed)
Summary: rov/ewj  Anticoagulant Therapy  Managed by: Weston Brass, PharmD Referring MD: Verne Carrow PCP: Tresa Garter MD Supervising MD: Daleen Squibb MD, Maisie Fus Indication 1: Atrial Fibrillation Lab Used: [facility] [place] Site: [address] INR POC 2.6 INR RANGE 2.0-3.0  Dietary changes: no    Health status changes: no    Bleeding/hemorrhagic complications: no    Recent/future hospitalizations: no    Any changes in medication regimen? no    Recent/future dental: no  Any missed doses?: no       Is patient compliant with meds? yes       Allergies: 1)  ! Macrodantin (Nitrofurantoin Macrocrystal) 2)  ! Codeine Sulfate (Codeine Sulfate) 3)  ! Atenolol (Atenolol) 4)  Percocet (Oxycodone-Acetaminophen)  Anticoagulation Management History:      The patient is taking warfarin and comes in today for a routine follow up visit.  Positive risk factors for bleeding include an age of 75 years or older and presence of serious comorbidities.  The bleeding index is 'intermediate risk'.  Positive CHADS2 values include History of HTN and Age > 5 years old.  Her last INR was 2.6.  Anticoagulation responsible provider: Daleen Squibb MD, Maisie Fus.  INR POC: 2.6.  Cuvette Lot#: [id number].  Exp: 10/2010.    Anticoagulation Management Assessment/Plan:      The patient's current anticoagulation dose is Warfarin sodium 5 mg tabs: Use as directed by Anticoagulation Clinic.  The target INR is 2.0-3.0.  The next INR is due 09/07/2009.  Anticoagulation instructions were given to patient.  Results were reviewed/authorized by Weston Brass, PharmD.  She was notified by Weston Brass PharmD.         Prior Anticoagulation Instructions: INR 2.6  Continue on same dosage 1 tablet daily except 1/2 tablet on Tuesdays and Thursdays.  Recheck in 4 weeks.    Current Anticoagulation Instructions: INR 2.6  Continue same dose of 1 tablet every day except 1/2 tablet on Tuesday and Thursday.

## 2010-02-21 NOTE — Miscellaneous (Signed)
Summary: Order/[facility]  [facility]   Imported By: Lester Redding 12/11/2009 10:53:11  _____________________________________________________________________  External Attachment:    Type:   Image     Comment:   External Document

## 2010-02-21 NOTE — Assessment & Plan Note (Signed)
Summary: 4 MO ROV /NWS $50   Vital Signs:  Patient profile:   77 year old female Height:      65 inches Weight:      162 pounds BMI:     27.06 Temp:     97.8 degrees F oral BP sitting:   116 / 76  (left arm)  Vitals Entered By: Tora Perches (May 31, 2008 1:22 PM) CC: f/u Is Patient Diabetic? No   CC:  f/u.  History of Present Illness: The patient presents for a follow up of hypertension, LBP, hyperlipidemia, OA. Has been SOB w/exertion w/o CP - not new. No palpitations.   Current Medications (verified): 1)  Celebrex 200 Mg  Caps (Celecoxib) .... Once Daily 2)  Temazepam 30 Mg  Caps (Temazepam) .... Take 1 Tablet By Mouth Once A Day 3)  Triamterene-Hctz 75-50 Mg  Tabs (Triamterene-Hctz) .... Take 1 Tablet By Mouth Once A Day 4)  Vitamin B Complex   Caps (B Complex Vitamins) .... Take 1 Tablet By Mouth Once A Day 5)  Vitamin D3 1000 Unit  Tabs (Cholecalciferol) .[name].. 1 Qd 6)  Benazepril Hcl 20 Mg  Tabs (Benazepril Hcl) .... 2 By Mouth Daily 7)  Amlodipine Besylate 5 Mg  Tabs (Amlodipine Besylate) .[name].. 1 By Mouth Qd  Allergies: 1)  ! Macrodantin (Nitrofurantoin Macrocrystal) 2)  ! Codeine Sulfate (Codeine Sulfate) 3)  ! Atenolol (Atenolol)  Past History:  Past Medical History:    Hypertension    Low back pain, spinal stenosis    Osteoarthritis    Osteopenia    Elevated mcv 790.09    Constipation  564.00Iischemic colitis with rectal bleeding  558.9    Borderline low B12     (06/01/2007)  Family History:    Family History Hypertension     (03/02/2007)  Social History:    Never Smoked    Retired    Married     (03/02/2007)  Family History:    Reviewed history from 03/02/2007 and no changes required:       Family History Hypertension  Social History:    Reviewed history from 03/02/2007 and no changes required:       Never Smoked       Retired       Married  Review of Systems       The patient complains of dyspnea on exertion and difficulty walking.  The  patient denies anorexia, fever, weight gain, vision loss, hoarseness, chest pain, syncope, peripheral edema, prolonged cough, hemoptysis, melena, severe indigestion/heartburn, muscle weakness, transient blindness, depression, abnormal bleeding, and angioedema.    Physical Exam  General:  Well-developed,well-nourished,in no acute distress; alert,appropriate and cooperative throughout examination Ears:  External ear exam shows no significant lesions or deformities.  Otoscopic examination reveals clear canals, tympanic membranes are intact bilaterally without bulging, retraction, inflammation or discharge. Hearing is grossly normal bilaterally. Nose:  External nasal examination shows no deformity or inflammation. Nasal mucosa are pink and moist without lesions or exudates. Mouth:  Oral mucosa and oropharynx without lesions or exudates.  Teeth in good repair. Neck:  No deformities, masses, or tenderness noted. Lungs:  Normal respiratory effort, chest expands symmetrically. Lungs are clear to auscultation, no crackles or wheezes. Heart:  Irreg irreg Abdomen:  Bowel sounds positive,abdomen soft and non-tender without masses, organomegaly or hernias noted. Msk:  Lumbar-sacral spine is tender to palpation over paraspinal muscles and painfull with the ROM  Extremities:  No clubbing, cyanosis, edema, or deformity noted with normal  full range of motion of all joints.   Neurologic:  No cranial nerve deficits noted. Station and gait are normal. Plantar reflexes are down-going bilaterally. DTRs are symmetrical throughout. Sensory, motor and coordinative functions appear intact. Skin:  Intact without suspicious lesions or rashes Psych:  Cognition and judgment appear intact. Alert and cooperative with normal attention span and concentration. No apparent delusions, illusions, hallucinations   Impression & Recommendations:  Problem # 1:  DYSPNEA (ICD-786.05) Assessment Unchanged  Problem # 2:  ATRIAL FLUTTER  (ICD-427.32) likely contributing to #1 Assessment: New Discussed. Card consult to discuss anticoag., rate control etc Time pulse and record once daily  The office visit took longer than 45 min with patient councelling for more than 50% of the 45 min  Her updated medication list for this problem includes:    Amlodipine Besylate 5 Mg Tabs (Amlodipine besylate) .[name]... 1 by mouth qd    Aspirin 325 Mg Tabs (Aspirin) .[name]... Take 1 tab by mouth every day  Problem # 3:  FATIGUE (ICD-780.79) Assessment: Unchanged  Problem # 4:  OSTEOARTHRITIS (ICD-715.90) Assessment: Unchanged Working w/a chiroprachtor Her updated medication list for this problem includes:    Celebrex 200 Mg Caps (Celecoxib) ..... Once daily    Aspirin 325 Mg Tabs (Aspirin) .[name]... Take 1 tab by mouth every day  Problem # 5:  HYPERTENSION (ICD-401.9) Assessment: Unchanged  Her updated medication list for this problem includes:    Triamterene-hctz 75-50 Mg Tabs (Triamterene-hctz) .[name]... Take 1 tablet by mouth once a day    Benazepril Hcl 20 Mg Tabs (Benazepril hcl) .[name]... 2 by mouth daily    Amlodipine Besylate 5 Mg Tabs (Amlodipine besylate) .[name]... 1 by mouth qd  BP today: 116/76 Prior BP: 134/60 (02/02/2008)  Labs Reviewed: K+: 3.9 (05/24/2008) Creat: : 1.1 (05/24/2008)   Chol: 221 (05/24/2008)   HDL: 91.20 (05/24/2008)   TG: 63.0 (05/24/2008)  Complete Medication List: 1)  Celebrex 200 Mg Caps (Celecoxib) .... Once daily 2)  Temazepam 30 Mg Caps (Temazepam) .... Take 1 tablet by mouth once a day 3)  Triamterene-hctz 75-50 Mg Tabs (Triamterene-hctz) .... Take 1 tablet by mouth once a day 4)  Vitamin B Complex Caps (B complex vitamins) .... Take 1 tablet by mouth once a day 5)  Vitamin D3 1000 Unit Tabs (Cholecalciferol) .[name].. 1 qd 6)  Benazepril Hcl 20 Mg Tabs (Benazepril hcl) .... 2 by mouth daily 7)  Amlodipine Besylate 5 Mg Tabs (Amlodipine besylate) .[name].. 1 by mouth qd 8)  Aspirin 325 Mg Tabs (Aspirin) .... Take 1 tab by  mouth every day  Other Orders: EKG w/ Interpretation (93000) Cardiology Referral (Cardiology)  Patient Instructions: 1)  Please schedule a follow-up appointment in 4 months. 2)  Call  if worse. Go to ER if feeling really bad!

## 2010-02-21 NOTE — Medication Information (Signed)
Summary: rov/ewj  Anticoagulant Therapy  Managed by: Reina Fuse, PharmD Referring MD: Verne Carrow PCP: Tresa Garter MD Supervising MD: Johney Frame MD, Fayrene Fearing Indication 1: Atrial Fibrillation Lab Used: [facility] [place] Site: [address] INR POC 3.1 INR RANGE 2.0-3.0  Dietary changes: no    Health status changes: yes       Details: Not feeling well, tired, fatigued, saw MD this AM.   Bleeding/hemorrhagic complications: yes       Details: Dark stools  Recent/future hospitalizations: no    Any changes in medication regimen? yes       Details: Finished with prednisone. New medication samples today, but pt unsure of what it is.   Recent/future dental: no  Any missed doses?: no       Is patient compliant with meds? yes      Comments: Pt saw PCP this AM complaining of not feeling well. Labs and h/o of dark stools concerning for bleed. Pt to f/u with MD on Monday AM.   Current Medications (verified): 1)  Temazepam 30 Mg  Caps (Temazepam) .... Take 1 Tablet By Mouth Once A Day 2)  Triamterene-Hctz 75-50 Mg  Tabs (Triamterene-Hctz) .... Take 1 Tablet By Mouth Once A Day 3)  Benazepril Hcl 20 Mg  Tabs (Benazepril Hcl) .[name].. 1-2 By Mouth Daily 4)  Amlodipine Besylate 5 Mg  Tabs (Amlodipine Besylate) .... 1/2 By Mouth Once Daily 5)  Metoprolol Succinate 50 Mg Xr24h-Tab (Metoprolol Succinate) .[name].. 1 Tab Two Times A Day 6)  Furosemide 40 Mg Tabs (Furosemide) .[name].. 1 By Mouth Every Other Day As Needed Swelling 7)  Vitamin D3 1000 Unit  Tabs (Cholecalciferol) .[name].. 1 Qd 8)  Aspirin 81 Mg Tbec (Aspirin) .... Take One Tablet By Mouth Daily 9)  Vitamin B Complex   Caps (B Complex Vitamins) .... Take 1 Tablet By Mouth Once A Day 10)  Warfarin Sodium 5 Mg Tabs (Warfarin Sodium) .... Use As Directed By Anticoagulation Clinic 11)  Calcium Carbonate-Vitamin D 600-400 Mg-Unit  Tabs (Calcium Carbonate-Vitamin D) .... Take 1 Tablet By Mouth Once A Day 12)  Prednisone 10 Mg  Tabs (Prednisone) .... Take 40mg  Qd For 3 Days, Then 20 Mg Qd For 3 Days, Then 10mg  Qd For 6 Days, Then Stop. Take Pc. 13)  Tramadol Hcl 50 Mg Tabs (Tramadol Hcl) .[name].. 1-2 Tabs By Mouth Two Times A Day As Needed Pain  Allergies (verified): 1)  ! Macrodantin (Nitrofurantoin Macrocrystal) 2)  ! Codeine Sulfate (Codeine Sulfate) 3)  ! Atenolol (Atenolol) 4)  Percocet (Oxycodone-Acetaminophen)  Anticoagulation Management History:      The patient is taking warfarin and comes in today for a routine follow up visit.  Positive risk factors for bleeding include an age of 86 years or older and presence of serious comorbidities.  The bleeding index is 'intermediate risk'.  Positive CHADS2 values include History of HTN and Age > 71 years old.  Her last INR was 2.6.  Anticoagulation responsible provider: Alexarae Oliva MD, Fayrene Fearing.  INR POC: 3.1.  Cuvette Lot#: [id number].  Exp: 11/2010.    Anticoagulation Management Assessment/Plan:      The patient's current anticoagulation dose is Warfarin sodium 5 mg tabs: Use as directed by Anticoagulation Clinic.  The target INR is 2.0-3.0.  The next INR is due 11/15/2009.  Anticoagulation instructions were given to patient.  Results were reviewed/authorized by Reina Fuse, PharmD.  She was notified by Reina Fuse PharmD.  Prior Anticoagulation Instructions: INR 3.2  Change dose to 1/2 tablet everyday except 1 tablet on Mondays, Wednesdays, and Fridays. Re-check INR in 2 weeks.   Current Anticoagulation Instructions: INR 3.1  Today, Thursday, October 6th, do not take Coumadin 2.5 mg (0.5 tab). Then, continue taking Coumadin 5 mg (1 tab) on Mon, Wed, Fri and continue taking Couamdin 2.5 mg (0.5 tab) on Sun, Tues, Thur, Sat. Return to clinic in 3 weeks.

## 2010-02-21 NOTE — Miscellaneous (Signed)
Summary: Orders Update  Clinical Lists Changes  Orders: Added new Test order of Venous Duplex Lower Extremity (Venous Duplex Lower) - Signed 

## 2010-02-21 NOTE — Progress Notes (Signed)
Summary: celebrex  Phone Note Other Incoming Call back at fax   [facility] Summary of Call: celebrex Initial call taken by: Tora Perches,  November 30, 2006 4:28 PM      Prescriptions: CELEBREX 200 MG  CAPS (CELECOXIB) once daily  #60 x 6   Entered by:   Tora Perches   Authorized by:   Tresa Garter MD   Signed by:   Tora Perches on 11/30/2006   Method used:   Electronically sent to ...       [facility] [address] [phone][facility]       [address]       [place], [place]  [address]       Ph: [phone] or [phone]       Fax: [phone]   RxID:   [id number]

## 2010-02-21 NOTE — Letter (Signed)
Summary: [facility]  [facility]   Imported By: Sherian Rein 03/20/2009 09:54:17  _____________________________________________________________________  External Attachment:    Type:   Image     Comment:   External Document

## 2010-02-21 NOTE — Progress Notes (Signed)
Summary: CALL BACK   Phone Note Call from Patient Call back at Home Phone [phone]   Caller: Patient Summary of Call: Patient lmovm requesting a call back.Marland KitchenMarland KitchenAlvy Beal Archie CMA  October 30, 2009 9:51 AM   Follow-up for Phone Call        Spoke w/pt, She missed apt yesterday b/c she had EGD w/GI. Transferred to scheduler to set up apt. No show was removed from 10/10 apt Follow-up by: Lamar Sprinkles, CMA,  October 30, 2009 3:59 PM

## 2010-02-21 NOTE — Progress Notes (Signed)
Summary: ER  Phone Note Call from Patient   Summary of Call: Pt's daughter called, concerned about pt's fatigue. I spoke w/pt, she reports she is feeling better. She did not get the info regarding benazepril from last office visit. (Or forgot it) Pt will only take 1/2 tab, monitor her BP and report readings to office. EMR updated  Daughter is also req to know when pt is due for coumadin f/u. Per daughter, cardiology told pt to wait until f/u w/[facility]. F/u is not until 12/03/2009. I will send message to coumadin clinic to find out when she needs f/u.  Initial call taken by: Lamar Sprinkles, CMA,  November 07, 2009 4:22 PM  Follow-up for Phone Call        SEE cardiology phone notes in EMR> patient was sent to ER for eval.  Follow-up by: Lamar Sprinkles, CMA,  November 09, 2009 12:36 PM  Additional Follow-up for Phone Call Additional follow up Details #1::        agree Thx Additional Follow-up by: Tresa Garter MD,  November 10, 2009 10:07 AM    New/Updated Medications: BENAZEPRIL HCL 20 MG  TABS (BENAZEPRIL HCL) 1/2 tablet by  mouth daily

## 2010-02-21 NOTE — Progress Notes (Signed)
Summary: Med ?  Phone Note Call from Patient   Caller: Pam   ?? Summary of Call: discharge papers from [facility] state: take 325mg  Ferrous sulfate and on another page that she was given says stop Iron 65mg .  They are confused as to what she is to be taking.  Please advise Initial call taken by: Lanier Prude, [facility]),  November 13, 2009 3:35 PM  Follow-up for Phone Call        Pls get Ferrex forte 1 two times a day for now Follow-up by: Tresa Garter MD,  November 13, 2009 6:05 PM  Additional Follow-up for Phone Call Additional follow up Details #1::        Daughter informed Additional Follow-up by: Lamar Sprinkles, CMA,  November 14, 2009 2:36 PM    New/Updated Medications: FERREX 150 FORTE 150-25-1 MG-MCG-MG CAPS (IRON POLYSACCH CMPLX-B12-FA) 1 by mouth bid Prescriptions: FERREX 150 FORTE 150-25-1 MG-MCG-MG CAPS (IRON POLYSACCH CMPLX-B12-FA) 1 by mouth bid  #60 x 3   Entered and Authorized by:   Tresa Garter MD   Signed by:   Lamar Sprinkles, CMA on 11/14/2009   Method used:   Electronically to        [facility] [address] [phone]* (retail)       [address]       [place], [place]  [address]       Ph: [phone] or [phone]       Fax: [phone]   RxID:   [id number]

## 2010-02-21 NOTE — Miscellaneous (Signed)
Summary: Order for Coumadin/[facility]  Order for Coumadin/[facility]   Imported By: Sherian Rein 11/26/2009 08:12:13  _____________________________________________________________________  External Attachment:    Type:   Image     Comment:   External Document

## 2010-02-21 NOTE — Medication Information (Signed)
Summary: rov.mp  Anticoagulant Therapy  Managed by: Cloyde Reams, RN, BSN Referring MD: Verne Carrow PCP: Tresa Garter MD Supervising MD: Jens Som MD, Arlys John Indication 1: Atrial Fibrillation Lab Used: [facility] Site: [address] INR POC 1.9 INR RANGE 2.0-3.0  Dietary changes: no    Health status changes: no    Bleeding/hemorrhagic complications: no    Recent/future hospitalizations: no    Any changes in medication regimen? no    Recent/future dental: no  Any missed doses?: no       Is patient compliant with meds? yes      Comments: Pt states she is taking 1/2 tablet daily.    Allergies: 1)  ! Macrodantin (Nitrofurantoin Macrocrystal) 2)  ! Codeine Sulfate (Codeine Sulfate) 3)  ! Atenolol (Atenolol) 4)  Percocet (Oxycodone-Acetaminophen)  Anticoagulation Management History:      The patient is taking warfarin and comes in today for a routine follow up visit.  Positive risk factors for bleeding include an age of 89 years or older and presence of serious comorbidities.  The bleeding index is 'intermediate risk'.  Positive CHADS2 values include History of HTN and Age > 67 years old.  Her last INR was 2.6.  Anticoagulation responsible provider: Jens Som MD, Arlys John.  INR POC: 1.9.  Exp: 02/2010.    Anticoagulation Management Assessment/Plan:      The patient's current anticoagulation dose is Warfarin sodium 2.5 mg tabs: take one tablet daily.  The target INR is 2.0-3.0.  The next INR is due 03/14/2009.  Anticoagulation instructions were given to patient.  Results were reviewed/authorized by Cloyde Reams, RN, BSN.  She was notified by Cloyde Reams RN.         Prior Anticoagulation Instructions: INR 3.5  Attempted to call pt, LMOM TCB for results. Bethena Midget, RN, BSN  February 01, 2009 1:29 PM  02/01/09 @217pm - Pt returned call, skip coumadin today. Then change dose to 2.5mg s daily except 5mg s on Mondays and Fridays.  Recheck on 02/12/09, orders sent to  [facility]  Current Anticoagulation Instructions: INR 1.9  Start taking 1/2 tablet daily except 1 tablet on Wednesdays.  Recheck in 2 weeks.

## 2010-02-21 NOTE — Medication Information (Signed)
Summary: rov/tm  Anticoagulant Therapy  Managed by: Cloyde Reams, RN, BSN Referring MD: Verne Carrow PCP: Tresa Garter MD Supervising MD: Ladona Ridgel MD, Sharlot Gowda Indication 1: Atrial Fibrillation Lab Used: [facility] [place] Site: [address] INR POC 2.6 INR RANGE 2.0-3.0  Dietary changes: no    Health status changes: no    Bleeding/hemorrhagic complications: no    Recent/future hospitalizations: no    Any changes in medication regimen? no    Recent/future dental: no  Any missed doses?: no       Is patient compliant with meds? yes       Allergies: 1)  ! Macrodantin (Nitrofurantoin Macrocrystal) 2)  ! Codeine Sulfate (Codeine Sulfate) 3)  Atenolol (Atenolol) 4)  Percocet (Oxycodone-Acetaminophen) 5)  Biaxin  Anticoagulation Management History:      The patient is taking warfarin and comes in today for a routine follow up visit.  Positive risk factors for bleeding include an age of 56 years or older, history of GI bleeding, and presence of serious comorbidities.  The bleeding index is 'high risk'.  Positive CHADS2 values include History of HTN and Age > 31 years old.  Her last INR was 1.6 ratio.  Anticoagulation responsible provider: Ladona Ridgel MD, Sharlot Gowda.  INR POC: 2.6.  Cuvette Lot#: [id number].  Exp: 01/2011.    Anticoagulation Management Assessment/Plan:      The patient's current anticoagulation dose is Warfarin sodium 5 mg tabs: as directed.  The target INR is 2.0-3.0.  The next INR is due 03/07/2010.  Anticoagulation instructions were given to patient.  Results were reviewed/authorized by Cloyde Reams, RN, BSN.  She was notified by Cloyde Reams RN.         Prior Anticoagulation Instructions: INR 3.0 On Tomorrow only take 1/2 pill then resume 1/2 pill everyday except 1 pill on Mondays, Wednesdays and Fridays. Recheck in 4 weeks.   Current Anticoagulation Instructions: INR 2.6  Continue on same dosage 1/2 tablets daily except 1 tablet on  Mondays, Wednesdays and Fridays.  Recheck in 4 weeks.

## 2010-02-21 NOTE — Miscellaneous (Signed)
Summary: Certification & Plan of Care / [facility]  Certification & Plan of Care / [facility]   Imported By: Lennie Odor 12/10/2009 14:35:05  _____________________________________________________________________  External Attachment:    Type:   Image     Comment:   External Document

## 2010-02-21 NOTE — Assessment & Plan Note (Signed)
Summary: per check out/sf   Visit Type:  Follow-up Primary Provider:  Tresa Garter MD  CC:  Back pain.  History of Present Illness: 77 yo WF with history of HTN who was recently seen by Dr. Posey Rea for a routine assessment and she was found to be in atrial fibrillation. She was unaware of this and did not feel any palpitations, fluttering, chest pain or SOB. She was referred for further evaluation 07/18/08. She has not been aware of any irregularities of her heart rhythm in the past. She has had no prior cardiac issues. She does not abuse alcohol and does not drink excessive caffeine. She wore a 21 day event monitor which showed episodes of atrial fibrillation  She has never been aware of her irregular heart rhythm. Her only compaint has been  lower back and hip pain. She currently has plans for a second surgical opinion. At her last visit, she was in sinus rhythm. We discussed starting coumadin at the last visit but she wanted to think about this.  She denies CP, SOB, palpitations, dizziness, orthopnea, PND, lower ext edema.   Current Medications (verified): 1)  Celebrex 200 Mg  Caps (Celecoxib) .... Once Daily 2)  Temazepam 30 Mg  Caps (Temazepam) .... Take 1 Tablet By Mouth Once A Day 3)  Triamterene-Hctz 75-50 Mg  Tabs (Triamterene-Hctz) .... Take 1 Tablet By Mouth Once A Day 4)  Vitamin B Complex   Caps (B Complex Vitamins) .... Take 1 Tablet By Mouth Once A Day 5)  Vitamin D3 1000 Unit  Tabs (Cholecalciferol) .[name].. 1 Qd 6)  Benazepril Hcl 20 Mg  Tabs (Benazepril Hcl) .... 2 By Mouth Daily 7)  Amlodipine Besylate 5 Mg  Tabs (Amlodipine Besylate) .[name].. 1 By Mouth Qd 8)  Aspirin 325 Mg Tabs (Aspirin) .... Take 1 Tab By Mouth Every Day 9)  Toprol Xl 25 Mg Xr24h-Tab (Metoprolol Succinate) .... Take  Half Tablet By Mouth Twice  A Day 10)  Tramadol Hcl 50 Mg  Tabs (Tramadol Hcl) .... As Needed  Allergies (verified): 1)  ! Macrodantin (Nitrofurantoin Macrocrystal) 2)  ! Codeine Sulfate  (Codeine Sulfate) 3)  ! Atenolol (Atenolol) 4)  Percocet (Oxycodone-Acetaminophen)  Past History:  Past Medical History: Reviewed history from 08/17/2008 and no changes required. Paroxysmal Atrial Fibrillation Hypertension Low back pain, spinal stenosis Osteoarthritis Osteopenia Elevated mcv 790.09 Constipation  564.00Iischemic colitis with rectal bleeding  558.9 Borderline low B12  Past Surgical History: Reviewed history from 07/18/2008 and no changes required. Left rotator cuff surgery 1997 Tonsillectomy  Review of Systems       The patient complains of joint pain.  The patient denies fatigue, malaise, fever, weight gain/loss, vision loss, decreased hearing, hoarseness, chest pain, palpitations, shortness of breath, prolonged cough, wheezing, sleep apnea, coughing up blood, abdominal pain, blood in stool, nausea, vomiting, diarrhea, heartburn, incontinence, blood in urine, muscle weakness, leg swelling, rash, skin lesions, headache, fainting, dizziness, depression, anxiety, enlarged lymph nodes, easy bruising or bleeding, and environmental allergies.    Vital Signs:  Patient profile:   77 year old female Height:      64 inches Weight:      172 pounds BMI:     29.63 Pulse rate:   111 / minute BP sitting:   112 / 70  (left arm)  Vitals Entered By: Laurance Flatten CMA (October 18, 2008 11:36 AM)  Physical Exam  General:  General: Well developed, well nourished, NAD HEENT: OP clear, mucus membranes moist SKIN: warm, dry  Psychiatric: Mood and affect normal Neck: No JVD, no carotid bruits, no thyromegaly, no lymphadenopathy. Lungs:Clear bilaterally, no wheezes, rhonci, crackles CV: Irregular. No murmurs, gallops rubs Abdomen: soft, NT, ND, BS present Extremities: No edema, pulses 2+.    EKG  Procedure date:  10/18/2008  Findings:      Atrial fibrillation, rate 111 bpm. RBBB. Nonspeciific T wave changes.   Impression & Recommendations:  Problem # 1:  ATRIAL  FIBRILLATION (ICD-427.31) Paroxysmal. She is back in atrial fibrillation today. She is not aware of this. She is willing to start coumadin. We will start 5 mg of Coumadin per day and will be seen in the coumadin clinic on Monday. I will increase her Toprol XL to 25 mg two times a day. If her rate cannot be controlled with beta blockers, will consider anti-arrhythmic therapy. Follow up 6 weeks.   Her updated medication list for this problem includes:    Aspirin 81 Mg Tbec (Aspirin) .[name]... Take one tablet by mouth daily    Toprol Xl 25 Mg Xr24h-tab (Metoprolol succinate) .[name]... Take 1 tablet two times a day    Warfarin Sodium 5 Mg Tabs (Warfarin sodium) .[name]... Take 1 tablet daily  Orders: [address] [facility] Referral (Coumadin clinic) EKG w/ Interpretation (93000)  Patient Instructions: 1)  Your physician recommends that you schedule a follow-up appointment in: 6 weeks with Dr. Clifton James. 2)  Appt. on Monday October 23, 2008 with coumadin clinic 3)  Your physician has recommended you make the following change in your medication: Increase Toprol XL to 25 mg two times a day 4)  Start Warfarin 5 mg daily.  Appt. to be scheduled with Coumadin Clinic for October 23, 2008.   5)  Decrease Aspirin to 81 mg daily Prescriptions: WARFARIN SODIUM 5 MG TABS (WARFARIN SODIUM) take 1 tablet daily  #30 x 0   Entered by:   Dossie Arbour, RN, BSN   Authorized by:   Verne Carrow, MD   Signed by:   Dossie Arbour, RN, BSN on 10/18/2008   Method used:   Electronically to        [facility] [address] [phone]* (retail)       [address]       [place], [place]  [address]       Ph: [phone] or [phone]       Fax: [phone]   RxID:   [id number] TOPROL XL 25 MG XR24H-TAB (METOPROLOL SUCCINATE) Take 1 tablet two times a day  #60 x 6   Entered by:   Dossie Arbour, RN, BSN   Authorized by:   Verne Carrow, MD   Signed by:   Dossie Arbour, RN,  BSN on 10/18/2008   Method used:   Electronically to        [facility] [address] [phone]* (retail)       [address]       [place], [place]  [address]       Ph: [phone] or [phone]       Fax: [phone]   RxID:   [id number]

## 2010-02-21 NOTE — Assessment & Plan Note (Signed)
Summary: 4 MTH FU---$50---STC   Vital Signs:  Patient Profile:   77 Years Old Female Weight:      167 pounds Temp:     97.2 degrees F oral Pulse rate:   80 / minute BP sitting:   134 / 60  (left arm)  Vitals Entered By: Tora Perches (February 02, 2008 1:23 PM)                 Chief Complaint:  Multiple medical problems or concerns.  History of Present Illness: The patient presents for a follow up of hypertension, hyperlipidemia, OA     Current Allergies (reviewed today): ! MACRODANTIN (NITROFURANTOIN MACROCRYSTAL) ! CODEINE SULFATE (CODEINE SULFATE) ! ATENOLOL (ATENOLOL)  Past Medical History:    Reviewed history from 06/01/2007 and no changes required:       Hypertension       Low back pain, spinal stenosis       Osteoarthritis       Osteopenia       Elevated mcv 790.09       Constipation  564.00Iischemic colitis with rectal bleeding  558.9       Borderline low B12   Family History:    Reviewed history from 03/02/2007 and no changes required:       Family History Hypertension  Social History:    Reviewed history from 03/02/2007 and no changes required:       Never Smoked       Retired       Married    Review of Systems  The patient denies anorexia, fever, syncope, and dyspnea on exertion.     Physical Exam  General:     Well-developed,well-nourished,in no acute distress; alert,appropriate and cooperative throughout examination Nose:     External nasal examination shows no deformity or inflammation. Nasal mucosa are pink and moist without lesions or exudates. Mouth:     Oral mucosa and oropharynx without lesions or exudates.  Teeth in good repair. Neck:     No deformities, masses, or tenderness noted. Lungs:     Normal respiratory effort, chest expands symmetrically. Lungs are clear to auscultation, no crackles or wheezes. Heart:     Normal rate and regular rhythm. S1 and S2 normal without gallop, murmur, click, rub or other extra sounds.  Abdomen:     Bowel sounds positive,abdomen soft and non-tender without masses, organomegaly or hernias noted. Msk:     Lumbar-sacral spine is tender to palpation over paraspinal muscles and painfull with the ROM  Extremities:     No clubbing, cyanosis, edema, or deformity noted with normal full range of motion of all joints.   Neurologic:     No cranial nerve deficits noted. Station and gait are normal. Plantar reflexes are down-going bilaterally. DTRs are symmetrical throughout. Sensory, motor and coordinative functions appear intact. Skin:     Intact without suspicious lesions or rashes Psych:     Cognition and judgment appear intact. Alert and cooperative with normal attention span and concentration. No apparent delusions, illusions, hallucinations    Impression & Recommendations:  Problem # 1:  HYPERTENSION (ICD-401.9) Assessment: Improved  Her updated medication list for this problem includes:    Triamterene-hctz 75-50 Mg Tabs (Triamterene-hctz) .[name]... Take 1 tablet by mouth once a day    Benazepril Hcl 20 Mg Tabs (Benazepril hcl) .[name]... 2 by mouth daily    Amlodipine Besylate 5 Mg Tabs (Amlodipine besylate) .[name]... 1 by mouth qd  BP today: 134/60 Prior BP: 140/70 (  10/05/2007)  Labs Reviewed: Creat: 1.4 (10/05/2007)   Problem # 2:  LOW BACK PAIN (ICD-724.2) Assessment: Comment Only  Her updated medication list for this problem includes:    Celebrex 200 Mg Caps (Celecoxib) ..... Once daily  Orders: TLB-Udip ONLY (81003-UDIP) TLB-Udip w/ Micro (81001-URINE)   Problem # 3:  OSTEOARTHRITIS (ICD-715.90)  Her updated medication list for this problem includes:    Celebrex 200 Mg Caps (Celecoxib) ..... Once daily   Complete Medication List: 1)  Celebrex 200 Mg Caps (Celecoxib) .... Once daily 2)  Temazepam 30 Mg Caps (Temazepam) .... Take 1 tablet by mouth once a day 3)  Triamterene-hctz 75-50 Mg Tabs (Triamterene-hctz) .... Take 1 tablet by mouth once a day 4)   Vitamin B Complex Caps (B complex vitamins) .... Take 1 tablet by mouth once a day 5)  Vitamin D3 1000 Unit Tabs (Cholecalciferol) .[name].. 1 qd 6)  Benazepril Hcl 20 Mg Tabs (Benazepril hcl) .... 2 by mouth daily 7)  Amlodipine Besylate 5 Mg Tabs (Amlodipine besylate) .[name].. 1 by mouth qd  Other Orders: H1N1 vaccine G code (Z6109)   Patient Instructions: 1)  Please schedule a follow-up appointment in 4 months. 2)  BMP prior to visit, ICD-9: 3)  Hepatic Panel prior to visit, ICD-9: 401.1 995.20 4)  Lipid Panel prior to visit, ICD-9: 5)  TSH prior to visit, ICD-9: 6)  HbgA1C prior to visit, ICD-9:     Orders Added: 1)  Est. Patient Level IV [60454] 2)  H1N1 vaccine G code [G9142] 3)  TLB-Udip ONLY [81003-UDIP] 4)  TLB-Udip w/ Micro [81001-URINE]    H1N1 # 1    Vaccine Type: H1N1 vaccine G code    Site: left deltoid    Mfr: novartis    Dose: 0.5 ml    Route: IM    Given by: Tora Perches    Exp. Date: 04/2008    Lot #: [id number]    VIS given: 10/20/2008 given February 02, 2008.

## 2010-02-21 NOTE — Medication Information (Signed)
Summary: rov/eac  Anticoagulant Therapy  Managed by: Eda Keys, PharmD Referring MD: Verne Carrow PCP: Tresa Garter MD Supervising MD: Eden Emms MD, Theron Arista Indication 1: Atrial Fibrillation Lab Used: [facility] Site: [address] INR POC 1.9 INR RANGE 2.0-3.0  Dietary changes: no    Health status changes: no    Bleeding/hemorrhagic complications: no    Recent/future hospitalizations: no    Any changes in medication regimen? no    Recent/future dental: no  Any missed doses?: no       Is patient compliant with meds? yes       Allergies: 1)  ! Macrodantin (Nitrofurantoin Macrocrystal) 2)  ! Codeine Sulfate (Codeine Sulfate) 3)  ! Atenolol (Atenolol) 4)  Percocet (Oxycodone-Acetaminophen)  Anticoagulation Management History:      The patient is taking warfarin and comes in today for a routine follow up visit.  Positive risk factors for bleeding include an age of 75 years or older and presence of serious comorbidities.  The bleeding index is 'intermediate risk'.  Positive CHADS2 values include History of HTN and Age > 77 years old.  Her last INR was 2.6.  Anticoagulation responsible provider: Eden Emms MD, Theron Arista.  INR POC: 1.9.  Cuvette Lot#: [id number].  Exp: 05/2010.    Anticoagulation Management Assessment/Plan:      The patient's current anticoagulation dose is Warfarin sodium 2.5 mg tabs: take one tablet daily.  The target INR is 2.0-3.0.  The next INR is due 04/18/2009.  Anticoagulation instructions were given to patient.  Results were reviewed/authorized by Eda Keys, PharmD.  She was notified by Eda Keys.         Prior Anticoagulation Instructions: INR 1.8  Take 1 tablet today.  Then start NEW dosing schedule of 1/2 tablet daily, except for 1 tablet on Wednesday and Friday.  Return to clinic in 2 weeks.    Current Anticoagulation Instructions: INR 1.9  Start NEW dosing schedule of 1 tablet on Monday, Wednesday, and Friday, and take  1/2 tablet all other days.  Return to clinic in 3 weeks.

## 2010-02-21 NOTE — Progress Notes (Signed)
Summary: [facility] HEALTH  Phone Note From Other Clinic   Caller: [facility] [phone] OR [phone] Summary of Call: [facility] called w/FYI - Pt has been set up for home health services. Call with any further orders if needed.   Initial call taken by: Lamar Sprinkles, CMA,  November 15, 2009 12:21 PM  Follow-up for Phone Call        Noted. Thx Follow-up by: Tresa Garter MD,  November 15, 2009 1:29 PM

## 2010-02-21 NOTE — Medication Information (Signed)
Summary: rov/sp  Anticoagulant Therapy  Managed by: Elaina Pattee, PharmD Referring MD: Verne Carrow PCP: Georgina Quint [name] MD Supervising MD: Ladona Ridgel MD, Sharlot Gowda Indication 1: Atrial Fibrillation INR POC 3.6 INR RANGE 2.0-3.0  Dietary changes: no    Health status changes: no    Bleeding/hemorrhagic complications: no    Recent/future hospitalizations: yes       Details: spinal surgery on 12/14  Any changes in medication regimen? no    Recent/future dental: no  Any missed doses?: no       Is patient compliant with meds? yes       Allergies (verified): 1)  ! Macrodantin (Nitrofurantoin Macrocrystal) 2)  ! Codeine Sulfate (Codeine Sulfate) 3)  ! Atenolol (Atenolol) 4)  Percocet (Oxycodone-Acetaminophen)  Anticoagulation Management History:      The patient is taking warfarin and comes in today for a routine follow up visit.  Positive risk factors for bleeding include an age of 75 years or older.  The bleeding index is 'intermediate risk'.  Positive CHADS2 values include History of HTN and Age > 38 years old.  Anticoagulation responsible provider: Ladona Ridgel MD, Sharlot Gowda.  INR POC: 3.6.  Cuvette Lot#: [id number].  Exp: 10/2009.    Anticoagulation Management Assessment/Plan:      The patient's current anticoagulation dose is Warfarin sodium 5 mg tabs: take 1 tablet daily.  The next INR is due 12/04/2008.  Anticoagulation instructions were given to patient.  Results were reviewed/authorized by Elaina Pattee, PharmD.  She was notified by Baron Sane, PharmD Candidate.         Prior Anticoagulation Instructions: INR 2.5  Continue to take 1 tablet every day except 1/2 tablet on Monday, Wednesday and Friday   Current Anticoagulation Instructions: INR 3.6  Hold today's dose only (11/23/2008), then take 1/2 tablet every day except on Monday, Wednesday and Friday take 1 tablet. Recheck INR on November 15.

## 2010-02-21 NOTE — Miscellaneous (Signed)
Summary: Order/[facility]  Order/[facility]   Imported By: Lester Hollyvilla 12/04/2009 10:27:40  _____________________________________________________________________  External Attachment:    Type:   Image     Comment:   External Document

## 2010-02-21 NOTE — Progress Notes (Signed)
  Phone Note Other Incoming   Summary of Call: Spoke w/pt: worsened LBP; can't walk well, weak LEs Initial call taken by: Tresa Garter MD,  October 11, 2009 5:06 PM  Follow-up for Phone Call        Options discussed. Trial of Predn. RTC on Mon if not well... Follow-up by: Tresa Garter MD,  October 11, 2009 5:06 PM    New/Updated Medications: PREDNISONE 10 MG TABS (PREDNISONE) Take 40mg  qd for 3 days, then 20 mg qd for 3 days, then 10mg  qd for 6 days, then stop. Take pc. Prescriptions: PREDNISONE 10 MG TABS (PREDNISONE) Take 40mg  qd for 3 days, then 20 mg qd for 3 days, then 10mg  qd for 6 days, then stop. Take pc.  #24 x 1   Entered and Authorized by:   Tresa Garter MD   Signed by:   Tresa Garter MD on 10/11/2009   Method used:   Electronically to        [facility]  [address] [phone]* (retail)       [address]       [place], [place]  [address]       Ph: [phone] or [phone]       Fax: [phone]   RxID:   [id number]

## 2010-02-21 NOTE — Progress Notes (Signed)
Summary: call report  Phone Note From Other Clinic   Caller: [facility] Summary of Call: FYI.[facility] called to inform MD the pt was negative for DVT. Patient has been released but states that we can call her at home with any additional advisement if needed. Initial call taken by: Rock Nephew CMA,  March 13, 2009 2:23 PM  Follow-up for Phone Call        Per MD pt able to go home, no addtional inform other than given at appt. Follow-up by: Rock Nephew CMA,  March 13, 2009 2:36 PM

## 2010-02-21 NOTE — Medication Information (Signed)
Summary: rov/mw  Anticoagulant Therapy  Managed by: Weston Brass, PharmD Referring MD: Verne Carrow PCP: Tresa Garter MD Supervising MD: Shirlee Latch MD, Majesti Gambrell Indication 1: Atrial Fibrillation Lab Used: [facility] [place] Site: [address] INR POC 3.2 INR RANGE 2.0-3.0  Dietary changes: no    Health status changes: no    Bleeding/hemorrhagic complications: no    Recent/future hospitalizations: no    Any changes in medication regimen? no    Recent/future dental: no  Any missed doses?: no       Is patient compliant with meds? yes       Allergies: 1)  ! Macrodantin (Nitrofurantoin Macrocrystal) 2)  ! Codeine Sulfate (Codeine Sulfate) 3)  ! Atenolol (Atenolol) 4)  Percocet (Oxycodone-Acetaminophen)  Anticoagulation Management History:      The patient is taking warfarin and comes in today for a routine follow up visit.  Positive risk factors for bleeding include an age of 75 years or older and presence of serious comorbidities.  The bleeding index is 'intermediate risk'.  Positive CHADS2 values include History of HTN and Age > 27 years old.  Her last INR was 2.6.  Anticoagulation responsible provider: Shirlee Latch MD, Tyeasha Ebbs.  INR POC: 3.2.  Cuvette Lot#: [id number].  Exp: 11/2010.    Anticoagulation Management Assessment/Plan:      The patient's current anticoagulation dose is Warfarin sodium 5 mg tabs: Use as directed by Anticoagulation Clinic.  The target INR is 2.0-3.0.  The next INR is due 10/25/2009.  Anticoagulation instructions were given to patient.  Results were reviewed/authorized by Weston Brass, PharmD.  She was notified by Harrel Carina, PharmD candidate.         Prior Anticoagulation Instructions: INR 3.4 Hold today. Take 1 tablet on sunday, monday, wednesday, and friday. Take a half tablet on tuesday, thursday, and saturday. see you in 2 weeks.  Current Anticoagulation Instructions: INR 3.2  Change dose to 1/2 tablet everyday except 1  tablet on Mondays, Wednesdays, and Fridays. Re-check INR in 2 weeks.

## 2010-02-21 NOTE — Medication Information (Signed)
Summary: rov/jnh  Anticoagulant Therapy  Managed by: Cloyde Reams, RN, BSN Referring MD: Verne Carrow PCP: Georgina Quint [name] MD Supervising MD: Eden Emms MD, Theron Arista Indication 1: Atrial Fibrillation INR POC 3.5 INR RANGE 2.0-3.0  Dietary changes: no    Health status changes: no    Bleeding/hemorrhagic complications: no    Recent/future hospitalizations: no    Any changes in medication regimen? no    Recent/future dental: no  Any missed doses?: no       Is patient compliant with meds? yes       Current Medications (verified): 1)  Celebrex 200 Mg  Caps (Celecoxib) .... Once Daily 2)  Temazepam 30 Mg  Caps (Temazepam) .... Take 1 Tablet By Mouth Once A Day 3)  Triamterene-Hctz 75-50 Mg  Tabs (Triamterene-Hctz) .... Take 1 Tablet By Mouth Once A Day 4)  Vitamin B Complex   Caps (B Complex Vitamins) .... Take 1 Tablet By Mouth Once A Day 5)  Vitamin D3 1000 Unit  Tabs (Cholecalciferol) .[name].. 1 Qd 6)  Benazepril Hcl 20 Mg  Tabs (Benazepril Hcl) .... 2 By Mouth Daily 7)  Amlodipine Besylate 5 Mg  Tabs (Amlodipine Besylate) .[name].. 1 By Mouth Qd 8)  Aspirin 81 Mg Tbec (Aspirin) .... Take One Tablet By Mouth Daily 9)  Toprol Xl 25 Mg Xr24h-Tab (Metoprolol Succinate) .... Take 1 Tablet Two Times A Day 10)  Tramadol Hcl 50 Mg  Tabs (Tramadol Hcl) .... As Needed 11)  Warfarin Sodium 5 Mg Tabs (Warfarin Sodium) .... Take 1 Tablet Daily  Allergies (verified): 1)  ! Macrodantin (Nitrofurantoin Macrocrystal) 2)  ! Codeine Sulfate (Codeine Sulfate) 3)  ! Atenolol (Atenolol) 4)  Percocet (Oxycodone-Acetaminophen)  Anticoagulation Management History:      The patient is taking warfarin and comes in today for a routine follow up visit.  Positive risk factors for bleeding include an age of 75 years or older.  The bleeding index is 'intermediate risk'.  Positive CHADS2 values include History of HTN and Age > 28 years old.  Anticoagulation responsible provider: Eden Emms MD, Theron Arista.  INR POC:  3.5.  Cuvette Lot#: [id number].  Exp: 11/2009.    Anticoagulation Management Assessment/Plan:      The patient's current anticoagulation dose is Warfarin sodium 5 mg tabs: take 1 tablet daily.  The next INR is due 11/13/2008.  Results were reviewed/authorized by Cloyde Reams, RN, BSN.  She was notified by Cloyde Reams RN.         Prior Anticoagulation Instructions: INR 3.7  Skip dose tonight (Monday).  Then start new dosing schedule of 1/2 tablet on Fridays, and 1 tablet on all other days.  Return to clinic in 1 week.  Current Anticoagulation Instructions: INR 3.5  Skip todays dose of coumadin, then start 1/2 tablet on Mondays, Wednesdays, and Fridays.  Recheck in 1 week.

## 2010-02-21 NOTE — Assessment & Plan Note (Signed)
Summary: eph/pt has back surgery/lg   Visit Type:  Follow-up Primary Provider:  Tresa Garter MD  CC:  pt denies any cp..sob.Marland Kitchendoes have some edema/ankles..pt recently had back surgery w/Dr. Channing Mutters.  History of Present Illness: 77 yo WF with history of HTN and paroxysmal atrial fibrillation who is here today for follow up after she was recently hospitalized for a back surgery.  She has had chronic atrial flutter/fibrillation over the eight months and hs been managed on coumadin and rate control agents. She has never been aware of any irregularity of her heart rhythm.  She tells me that she has continued pain in her lower back with some radiation into her legs. She has home health care with PT. She has had no chest pain, SOB, palpitations. Her only complaint today is the back pain.   Current Medications (verified): 1)  Temazepam 30 Mg  Caps (Temazepam) .... Take 1 Tablet By Mouth Once A Day 2)  Triamterene-Hctz 75-50 Mg  Tabs (Triamterene-Hctz) .... Take 1 Tablet By Mouth Once A Day 3)  Vitamin B Complex   Caps (B Complex Vitamins) .... Take 1 Tablet By Mouth Once A Day 4)  Vitamin D3 1000 Unit  Tabs (Cholecalciferol) .[name].. 1 Qd 5)  Benazepril Hcl 20 Mg  Tabs (Benazepril Hcl) .... 2 By Mouth Daily 6)  Amlodipine Besylate 5 Mg  Tabs (Amlodipine Besylate) .[name].. 1 By Mouth Qd 7)  Aspirin 81 Mg Tbec (Aspirin) .... Take One Tablet By Mouth Daily 8)  Metoprolol Tartrate 50 Mg Tabs (Metoprolol Tartrate) .... Take One Tablet By Mouth Twice A Day 9)  Warfarin Sodium 5 Mg Tabs (Warfarin Sodium) .... Take 1 Tablet Daily 10)  Diltiazem Hcl Er Beads 180 Mg Xr24h-Cap (Diltiazem Hcl Er Beads) .... Take One Capsule By Mouth Daily 11)  Oxycodone-Acetaminophen 5-325 Mg Tabs (Oxycodone-Acetaminophen) .... As Needed  Allergies: 1)  ! Macrodantin (Nitrofurantoin Macrocrystal) 2)  ! Codeine Sulfate (Codeine Sulfate) 3)  ! Atenolol (Atenolol) 4)  Percocet (Oxycodone-Acetaminophen)  Past History:  Past  Medical History: Paroxysmal Atrial Fibrillation on coumadin therapy Hypertension Low back pain, severe lumbar spondylosis s/p L3/L4, L4/L5 fusion 12/10. Osteoarthritis Osteopenia Elevated mcv 790.09 Constipation  564.00Iischemic colitis with rectal bleeding  558.9 Borderline low B12  Past Surgical History: Left rotator cuff surgery 1997 Tonsillectomy Back surgery-12/10 L3/L4/L5 fusion  Social History: Reviewed history from 07/18/2008 and no changes required. Never Smoked 1 glass of wine per day No illicit drugs Retired [employer] Married, 2 children-ages [phone].   Review of Systems       The patient complains of joint pain.  The patient denies fatigue, malaise, fever, weight gain/loss, vision loss, decreased hearing, hoarseness, chest pain, palpitations, shortness of breath, prolonged cough, wheezing, sleep apnea, coughing up blood, abdominal pain, blood in stool, nausea, vomiting, diarrhea, heartburn, incontinence, blood in urine, muscle weakness, leg swelling, rash, skin lesions, headache, fainting, dizziness, depression, anxiety, enlarged lymph nodes, easy bruising or bleeding, and environmental allergies.    Vital Signs:  Patient profile:   77 year old female Height:      64 inches Weight:      177 pounds BMI:     30.49 Pulse rate:   72 / minute Pulse rhythm:   regular BP sitting:   96 / 62  (left arm) Cuff size:   large  Vitals Entered By: Danielle Rankin, CMA ([birth year] 12:11 PM)  Physical Exam  General:  General: Well developed, well nourished, NAD HEENT: OP clear, mucus  membranes moist SKIN: warm, dry Neuro: No focal deficits Musculoskeletal: Muscle strength 5/5 all ext Psychiatric: Mood and affect normal Neck: No JVD, no carotid bruits, no thyromegaly, no lymphadenopathy. Lungs:Clear bilaterally, no wheezes, rhonci, crackles ZO:[id number], No murmurs, gallops rubs Abdomen: soft, NT, ND, BS present Extremities: 1+ bilateral lower extremity edema,  pulses 1-2+.    EKG  Procedure date:  02/12/2009  Findings:      Atrial flutter with variable block, rate 78 bpm. RBBB.   Impression & Recommendations:  Problem # 1:  ATRIAL FLUTTER (ICD-427.32) Continue coumadin. Continue Calcium channel blocker and beta blocker for rate control. No changes.    Her updated medication list for this problem includes:    Aspirin 81 Mg Tbec (Aspirin) .[name]... Take one tablet by mouth daily    Metoprolol Tartrate 50 Mg Tabs (Metoprolol tartrate) .[name]... Take one tablet by mouth twice a day    Warfarin Sodium 2.5 Mg Tabs (Warfarin sodium) .[name]... Take one tablet daily  Orders: EKG w/ Interpretation (93000)  Problem # 2:  HYPERTENSION (ICD-401.9) Well controlled on curren therapy.   Her updated medication list for this problem includes:    Triamterene-hctz 75-50 Mg Tabs (Triamterene-hctz) .[name]... Take 1 tablet by mouth once a day    Benazepril Hcl 20 Mg Tabs (Benazepril hcl) .[name]... 2 by mouth daily    Amlodipine Besylate 5 Mg Tabs (Amlodipine besylate) .[name]... 1 by mouth qd    Aspirin 81 Mg Tbec (Aspirin) .[name]... Take one tablet by mouth daily    Metoprolol Tartrate 50 Mg Tabs (Metoprolol tartrate) .[name]... Take one tablet by mouth twice a day    Diltiazem Hcl Er Beads 180 Mg Xr24h-cap (Diltiazem hcl er beads) .[name]... Take one capsule by mouth daily  Problem # 3:  EDEMA (ICD-782.3) She is known to have normal LV systolic function. This is likely dependent edema post surgery with immobility. I have asked her to elevate her legs during the day. If edema persists, we may consider low dose Lasix. She is to see Dr. Posey Rea tomorrow.   Patient Instructions: 1)  Your physician recommends that you schedule a follow-up appointment in: 6 months 2)  Your physician has recommended you make the following change in your medication: Shelby Dubin, PharmD in coumadin clinic said to make the following coumadin change. 3)  Do not take coumadin today. Resume coumadin tomorrow at 2.5  mg daily

## 2010-02-21 NOTE — Medication Information (Signed)
Summary: rov/eac  Anticoagulant Therapy  Managed by: Eda Keys, PharmD Referring MD: Verne Carrow PCP: Tresa Garter MD Supervising MD: Daleen Squibb MD, Maisie Fus Indication 1: Atrial Fibrillation Lab Used: [facility] Site: [address] INR POC 1.8 INR RANGE 2.0-3.0  Dietary changes: no    Health status changes: no    Bleeding/hemorrhagic complications: no    Recent/future hospitalizations: no    Any changes in medication regimen? no    Recent/future dental: no  Any missed doses?: no       Is patient compliant with meds? yes       Allergies: 1)  ! Macrodantin (Nitrofurantoin Macrocrystal) 2)  ! Codeine Sulfate (Codeine Sulfate) 3)  ! Atenolol (Atenolol) 4)  Percocet (Oxycodone-Acetaminophen)  Anticoagulation Management History:      The patient is taking warfarin and comes in today for a routine follow up visit.  Positive risk factors for bleeding include an age of 75 years or older and presence of serious comorbidities.  The bleeding index is 'intermediate risk'.  Positive CHADS2 values include History of HTN and Age > 77 years old.  Her last INR was 2.6.  Anticoagulation responsible provider: Daleen Squibb MD, Maisie Fus.  INR POC: 1.8.  Cuvette Lot#: [id number].  Exp: 04/2010.    Anticoagulation Management Assessment/Plan:      The patient's current anticoagulation dose is Warfarin sodium 2.5 mg tabs: take one tablet daily.  The target INR is 2.0-3.0.  The next INR is due 03/27/2009.  Anticoagulation instructions were given to patient.  Results were reviewed/authorized by Eda Keys, PharmD.  She was notified by Eda Keys.         Prior Anticoagulation Instructions: INR 1.9  Start taking 1/2 tablet daily except 1 tablet on Wednesdays.  Recheck in 2 weeks.    Current Anticoagulation Instructions: INR 1.8  Take 1 tablet today.  Then start NEW dosing schedule of 1/2 tablet daily, except for 1 tablet on Wednesday and Friday.  Return to clinic in 2 weeks.

## 2010-02-21 NOTE — Assessment & Plan Note (Signed)
Summary: 3 MO /NWS   Vital Signs:  Patient profile:   77 year old female Weight:      175 pounds Temp:     97.4 degrees F oral Pulse rate:   96 / minute BP sitting:   100 / 46  (left arm)  Vitals Entered By: Tora Perches (February 13, 2009 1:23 PM) CC: f/u Is Patient Diabetic? No   Primary Care Provider:  Tresa Garter MD  CC:  f/u.  History of Present Illness: The patient presents for a follow up of hypertension, LBP, hyperlipidemia, edema   Preventive Screening-Counseling & Management  Alcohol-Tobacco     Smoking Status: never  Current Medications (verified): 1)  Temazepam 30 Mg  Caps (Temazepam) .... Take 1 Tablet By Mouth Once A Day 2)  Triamterene-Hctz 75-50 Mg  Tabs (Triamterene-Hctz) .... Take 1 Tablet By Mouth Once A Day 3)  Vitamin B Complex   Caps (B Complex Vitamins) .... Take 1 Tablet By Mouth Once A Day 4)  Vitamin D3 1000 Unit  Tabs (Cholecalciferol) .[name].. 1 Qd 5)  Benazepril Hcl 20 Mg  Tabs (Benazepril Hcl) .... 2 By Mouth Daily 6)  Amlodipine Besylate 5 Mg  Tabs (Amlodipine Besylate) .[name].. 1 By Mouth Qd 7)  Aspirin 81 Mg Tbec (Aspirin) .... Take One Tablet By Mouth Daily 8)  Metoprolol Tartrate 50 Mg Tabs (Metoprolol Tartrate) .... Take One Tablet By Mouth Twice A Day 9)  Warfarin Sodium 2.5 Mg Tabs (Warfarin Sodium) .... Take One Tablet Daily 10)  Diltiazem Hcl Er Beads 180 Mg Xr24h-Cap (Diltiazem Hcl Er Beads) .... Take One Capsule By Mouth Daily 11)  Oxycodone-Acetaminophen 5-325 Mg Tabs (Oxycodone-Acetaminophen) .... As Needed  Allergies: 1)  ! Macrodantin (Nitrofurantoin Macrocrystal) 2)  ! Codeine Sulfate (Codeine Sulfate) 3)  ! Atenolol (Atenolol) 4)  Percocet (Oxycodone-Acetaminophen)  Past History:  Past Medical History: Last updated: 02/12/2009 Paroxysmal Atrial Fibrillation on coumadin therapy Hypertension Low back pain, severe lumbar spondylosis s/p L3/L4, L4/L5 fusion 12/10. Osteoarthritis Osteopenia Elevated mcv  790.09 Constipation  564.00Iischemic colitis with rectal bleeding  558.9 Borderline low B12  Past Surgical History: Last updated: 02/12/2009 Left rotator cuff surgery 1997 Tonsillectomy Back surgery-12/10 L3/L4/L5 fusion  Social History: Last updated: 07/18/2008 Never Smoked 1 glass of wine per day No illicit drugs Retired [place] [place] Married, 2 children-ages [phone].   Review of Systems       The patient complains of difficulty walking.  The patient denies chest pain, syncope, and muscle weakness.         leg swelling  Physical Exam  General:  General: Well developed, well nourished, NAD HEENT: OP clear, mucus membranes moist SKIN: warm, dry Neuro: No focal deficits Musculoskeletal: Muscle strength 5/5 all ext Psychiatric: Mood and affect normal Neck: No JVD, no carotid bruits, no thyromegaly, no lymphadenopathy. Lungs:Clear bilaterally, no wheezes, rhonci, crackles CV: RRR no murmurs, gallops rubs Abdomen: soft, NT, ND, BS present Extremities: B 1+edema, pulses 2+.  Msk:  Using a walker LS in a brace Skin:  Intact without suspicious lesions or rashes Psych:  Cognition and judgment appear intact. Alert and cooperative with normal attention span and concentration. No apparent delusions, illusions, hallucinations   Impression & Recommendations:  Problem # 1:  EDEMA (ICD-782.3) Assessment Deteriorated  Her updated medication list for this problem includes:    Triamterene-hctz 75-50 Mg Tabs (Triamterene-hctz) .[name]... Take 1 tablet by mouth once a day    Furosemide 40 Mg Tabs (Furosemide) .[name]... 1 by mouth q  am ( a water pill) as needed swelling  Problem # 2:  ATRIAL FIBRILLATION (ICD-427.31) Assessment: Improved  Her updated medication list for this problem includes:    Amlodipine Besylate 5 Mg Tabs (Amlodipine besylate) .[name]... 1 by mouth qd    Aspirin 81 Mg Tbec (Aspirin) .[name]... Take one tablet by mouth daily    Metoprolol Tartrate 50 Mg Tabs (Metoprolol  tartrate) .[name]... Take one tablet by mouth twice a day    Warfarin Sodium 2.5 Mg Tabs (Warfarin sodium) .[name]... Take one tablet daily    Diltiazem Hcl Er Beads 180 Mg Xr24h-cap (Diltiazem hcl er beads) .[name]... Take one capsule by mouth daily  Problem # 3:  LOW BACK PAIN (ICD-724.2) post-op Assessment: Unchanged  Her updated medication list for this problem includes:    Aspirin 81 Mg Tbec (Aspirin) .[name]... Take one tablet by mouth daily    Oxycodone-acetaminophen 5-325 Mg Tabs (Oxycodone-acetaminophen) .[name]... As needed  Problem # 4:  HYPERTENSION (ICD-401.9)  Her updated medication list for this problem includes:    Triamterene-hctz 75-50 Mg Tabs (Triamterene-hctz) .[name]... Take 1 tablet by mouth once a day    Benazepril Hcl 20 Mg Tabs (Benazepril hcl) .[name]... 2 by mouth daily    Amlodipine Besylate 5 Mg Tabs (Amlodipine besylate) .[name]... 1 by mouth qd    Metoprolol Tartrate 50 Mg Tabs (Metoprolol tartrate) .[name]... Take one tablet by mouth twice a day    Diltiazem Hcl Er Beads 180 Mg Xr24h-cap (Diltiazem hcl er beads) .[name]... Take one capsule by mouth daily    Furosemide 40 Mg Tabs (Furosemide) .[name]... 1 by mouth q am ( a water pill) as needed swelling  Complete Medication List: 1)  Temazepam 30 Mg Caps (Temazepam) .... Take 1 tablet by mouth once a day 2)  Triamterene-hctz 75-50 Mg Tabs (Triamterene-hctz) .... Take 1 tablet by mouth once a day 3)  Vitamin B Complex Caps (B complex vitamins) .... Take 1 tablet by mouth once a day 4)  Vitamin D3 1000 Unit Tabs (Cholecalciferol) .[name].. 1 qd 5)  Benazepril Hcl 20 Mg Tabs (Benazepril hcl) .... 2 by mouth daily 6)  Amlodipine Besylate 5 Mg Tabs (Amlodipine besylate) .[name].. 1 by mouth qd 7)  Aspirin 81 Mg Tbec (Aspirin) .... Take one tablet by mouth daily 8)  Metoprolol Tartrate 50 Mg Tabs (Metoprolol tartrate) .... Take one tablet by mouth twice a day 9)  Warfarin Sodium 2.5 Mg Tabs (Warfarin sodium) .... Take one tablet daily 10)  Diltiazem Hcl Er Beads 180 Mg  Xr24h-cap (Diltiazem hcl er beads) .... Take one capsule by mouth daily 11)  Oxycodone-acetaminophen 5-325 Mg Tabs (Oxycodone-acetaminophen) .... As needed 12)  Furosemide 40 Mg Tabs (Furosemide) .[name].. 1 by mouth q am ( a water pill) as needed swelling  Patient Instructions: 1)  Please schedule a follow-up appointment in 1 month. 2)  Elevate feet 10 min 4 times a day if possible 3)  BMP prior to visit, ICD-9: 782.3 Prescriptions: FUROSEMIDE 40 MG TABS (FUROSEMIDE) 1 by mouth q am ( a water pill) as needed swelling  #30 x 3   Entered and Authorized by:   Tresa Garter MD   Signed by:   Tresa Garter MD on 02/13/2009   Method used:   Electronically to        [facility] [address] [phone]* (retail)       [address]       [place], [place]  [address]       Ph: [phone] or [phone]       Fax: [phone]   RxID:   [id number]

## 2010-02-21 NOTE — Progress Notes (Signed)
Summary: Labs  Phone Note Call from Patient   Caller: [facility] Professional [phone]  Call For: Dr Christella Hartigan Summary of Call: Has an appt this week and needs labs done. They are drawing labs for Dr Posey Rea already on wed and wonders if they can just do all the labs together? Can we  Fax orders to  [phone] Attn Nena Polio RN Initial call taken by: Leanor Kail [facility],  November 26, 2009 10:26 AM  Follow-up for Phone Call        pt is having labs on WED and had labs last week Waynetta Sandy will have them faxed to [place] for review before pt appt next week. Follow-up by: Chales Abrahams CMA Duncan Dull),  November 26, 2009 10:40 AM     Appended Document: Labs Hb 8.6, mcv 103.4, rdw normal, plt 407 on labs 11/9 will discuss at rov later today

## 2010-02-21 NOTE — Assessment & Plan Note (Signed)
Summary: 6 MO ROA/NML   Vital Signs:  Patient Profile:   77 Years Old Female Weight:      160 pounds Temp:     97.3 degrees F oral Pulse rate:   88 / minute BP sitting:   147 / 70  (left arm)  Vitals Entered By: Tora Perches (March 02, 2007 1:28 PM)             Is Patient Diabetic? No     Chief Complaint:  Multiple medical problems or concerns.  History of Present Illness: F/u bronchitis, saw Dr Artist Pais, given Avelox and Prednisone.    Current Allergies: ! MACRODANTIN (NITROFURANTOIN MACROCRYSTAL) ! CODEINE SULFATE (CODEINE SULFATE) ! ATENOLOL (ATENOLOL)  Past Medical History:    Reviewed history from 11/30/2006 and no changes required:       Hypertension       Low back pain       Osteoarthritis       Osteopenia       elevated mcv 790.09       constipation  564.00       ischemic colitis with rectal bleeding  558.9       borderline low B12   Family History:    Family History Hypertension  Social History:    Reviewed history from 02/25/2007 and no changes required:       Never Smoked       Retired       Married    Review of Systems       The patient complains of prolonged cough.  The patient denies fever and peripheral edema.     Physical Exam  General:     Well-developed,well-nourished,in no acute distress; alert,appropriate and cooperative throughout examination Nose:     External nasal examination shows no deformity or inflammation. Nasal mucosa are pink and moist without lesions or exudates. Mouth:     Oral mucosa and oropharynx without lesions or exudates.  Teeth in good repair. Neck:     No deformities, masses, or tenderness noted. Lungs:     CTA Heart:     RRR Abdomen:     Bowel sounds positive,abdomen soft and non-tender without masses, organomegaly or hernias noted. Msk:     OK Neurologic:     No cranial nerve deficits noted. Station and gait are normal. Plantar reflexes are down-going bilaterally. DTRs are symmetrical throughout.  Sensory, motor and coordinative functions appear intact. Skin:     Intact without suspicious lesions or rashes Psych:     Cognition and judgment appear intact. Alert and cooperative with normal attention span and concentration. No apparent delusions, illusions, hallucinations    Impression & Recommendations:  Problem # 1:  DYSPNEA (ICD-786.05) Assessment: Improved CXR Due to #2  Problem # 2:  ACUTE BRONCHITIS (ICD-466.0) Assessment: Improved  Her updated medication list for this problem includes:    Avelox 400 Mg Tabs (Moxifloxacin hcl) ..... One by mouth once daily    Albuterol Sulfate 2 Mg Tabs (Albuterol sulfate) .[name]... 1 two times a day x 5 days as needed wheezing  Orders: T-2 View CXR, Same Day (71020.5TC)   Problem # 3:  HYPERTENSION (ICD-401.9) Assessment: Unchanged On Rx The following medications were removed from the medication list:    Tarka 4-240 Mg Tbcr (Trandolapril-verapamil hcl) .[name]... Take 1 tablet by mouth once a day  Her updated medication list for this problem includes:    Lotrel 5-40 Mg Caps (Amlodipine besy-benazepril hcl) .[name]... 1 by mouth qd  Triamterene-hctz 75-50 Mg Tabs (Triamterene-hctz) .[name]... Take 1 tablet by mouth once a day   Complete Medication List: 1)  Lotrel 5-40 Mg Caps (Amlodipine besy-benazepril hcl) .[name].. 1 by mouth qd 2)  Celebrex 200 Mg Caps (Celecoxib) .... Once daily 3)  Temazepam 30 Mg Caps (Temazepam) .... Take 1 tablet by mouth once a day 4)  Triamterene-hctz 75-50 Mg Tabs (Triamterene-hctz) .... Take 1 tablet by mouth once a day 5)  Vitamin B Complex Caps (B complex vitamins) .... Take 1 tablet by mouth once a day 6)  Vitamin D3 1000 Unit Tabs (Cholecalciferol) .[name].. 1 qd 7)  Prednisone 10 Mg Tabs (Prednisone) .... 3 tabs by mouth once daily x 3 days, 2 tabs  by mouth once daily x 3 days, 1 tab by mouth once daily x 3 days 8)  Avelox 400 Mg Tabs (Moxifloxacin hcl) .... One by mouth once daily 9)  Albuterol Sulfate 2 Mg Tabs  (Albuterol sulfate) .[name].. 1 two times a day x 5 days as needed wheezing   Patient Instructions: 1)  Please schedule a follow-up appointment in 3 months.    Prescriptions: LOTREL 5-40 MG  CAPS (AMLODIPINE BESY-BENAZEPRIL HCL) 1 by mouth qd  #30 x 12   Entered and Authorized by:   Tresa Garter MD   Signed by:   Tresa Garter MD on 03/02/2007   Method used:   Print then Give to Patient   RxID:   [id number] VITAMIN D3 1000 UNIT  TABS (CHOLECALCIFEROL) 1 qd  #30 x 12   Entered and Authorized by:   Tresa Garter MD   Signed by:   Tresa Garter MD on 03/02/2007   Method used:   Print then Give to Patient   RxID:   [id number] ALBUTEROL SULFATE 2 MG TABS (ALBUTEROL SULFATE) 1 two times a day x 5 days as needed wheezing  #30 x 1   Entered and Authorized by:   Tresa Garter MD   Signed by:   Tresa Garter MD on 03/02/2007   Method used:   Print then Give to Patient   RxID:   [id number]  ]

## 2010-02-21 NOTE — Progress Notes (Signed)
Summary: REQ REFERRAL  Phone Note Call from Patient Call back at Home Phone [phone]   Reason for Call: Referral Summary of Call: Patient is requesting a call back.  Initial call taken by: Lamar Sprinkles, CMA,  January 22, 2010 12:01 PM  Follow-up for Phone Call        Spoke with patient who stated that she continues to have difficulty walking. She has tried to work on this on her own with no improvement. Patient is now requesting rehab, please advise Thanks.Alvy Beal Archie CMA  January 22, 2010 3:06 PM   Additional Follow-up for Phone Call Additional follow up Details #1::        ok Rehab Med Cons Additional Follow-up by: Tresa Garter MD,  January 23, 2010 5:28 PM    Additional Follow-up for Phone Call Additional follow up Details #2::    pt informed  Follow-up by: Lanier Prude, [facility]),  January 24, 2010 2:57 PM

## 2010-02-21 NOTE — Progress Notes (Signed)
Summary: [facility] Episode Monitor Report  Phone Note Call from Patient Call back at Home Phone [phone]   Caller: Patient Reason for Call: Talk to Nurse, Talk to Doctor Summary of Call: rtn your call from yesterday and she will be at home this morning so you can call her back Initial call taken by: Omer Jack,  July 26, 2008 8:55 AM  Follow-up for Phone Call        RN s/w Pt re: [facility] Episode Monitor report received: atrial fibrillation hr: 54 - 125. Pt denies cp, sob, dizziness, palpitations and blurred vision. Pt reports having a stressful weekend d/t her new CPU breaking down and experiencing back trouble. RN advised Pt to call back if symptoms change. Pt verbalize understanding.  Follow-up by: Bernita Raisin, RN, BSN,  July 26, 2008 10:32 AM

## 2010-02-21 NOTE — Medication Information (Signed)
Summary: Coumadin Clinic  Anticoagulant Therapy  Managed by: Bethena Midget, RN, BSN Referring MD: Verne Carrow PCP: Tresa Garter MD Supervising MD: Graciela Husbands MD, Viviann Spare Indication 1: Atrial Fibrillation Lab Used: [facility] Site: [address] INR POC 2.5 INR RANGE 2.0-3.0  Dietary changes: no    Health status changes: no    Bleeding/hemorrhagic complications: no    Recent/future hospitalizations: no    Any changes in medication regimen? no    Recent/future dental: no  Any missed doses?: no       Is patient compliant with meds? yes       Current Medications (verified): 1)  Temazepam 30 Mg  Caps (Temazepam) .... Take 1 Tablet By Mouth Once A Day 2)  Triamterene-Hctz 75-50 Mg  Tabs (Triamterene-Hctz) .... Take 1 Tablet By Mouth Once A Day 3)  Vitamin B Complex   Caps (B Complex Vitamins) .... Take 1 Tablet By Mouth Once A Day 4)  Vitamin D3 1000 Unit  Tabs (Cholecalciferol) .[name].. 1 Qd 5)  Benazepril Hcl 20 Mg  Tabs (Benazepril Hcl) .... 2 By Mouth Daily 6)  Amlodipine Besylate 5 Mg  Tabs (Amlodipine Besylate) .[name].. 1 By Mouth Qd 7)  Aspirin 81 Mg Tbec (Aspirin) .... Take One Tablet By Mouth Daily 8)  Metoprolol Tartrate 50 Mg Tabs (Metoprolol Tartrate) .... Take One Tablet By Mouth Twice A Day 9)  Warfarin Sodium 5 Mg Tabs (Warfarin Sodium) .... Take 1 Tablet Daily 10)  Diltiazem Hcl Er Beads 180 Mg Xr24h-Cap (Diltiazem Hcl Er Beads) .... Take One Capsule By Mouth Daily 11)  Oxycodone-Acetaminophen 5-325 Mg Tabs (Oxycodone-Acetaminophen) .... As Needed  Allergies: 1)  ! Macrodantin (Nitrofurantoin Macrocrystal) 2)  ! Codeine Sulfate (Codeine Sulfate) 3)  ! Atenolol (Atenolol) 4)  Percocet (Oxycodone-Acetaminophen)  Anticoagulation Management History:      Her anticoagulation is being managed by telephone today.  Positive risk factors for bleeding include an age of 75 years or older.  The bleeding index is 'intermediate risk'.  Positive CHADS2 values  include History of HTN and Age > 32 years old.  Her last INR was 2.6.  Anticoagulation responsible provider: Graciela Husbands MD, Viviann Spare.  INR POC: 2.5.    Anticoagulation Management Assessment/Plan:      The patient's current anticoagulation dose is Warfarin sodium 5 mg tabs: take 1 tablet daily.  The target INR is 2.0-3.0.  The next INR is due 02/01/2009.  Anticoagulation instructions were given to patient.  Results were reviewed/authorized by Bethena Midget, RN, BSN.  She was notified by Bethena Midget, RN, BSN.         Prior Anticoagulation Instructions: INR 3.3 Today take 2.5mg s then resume 2.5mg s daily except 5mg s MWF. Recheck INR on 01/18/09. Orders sent to [facility].   Current Anticoagulation Instructions: INR 2.5 Continue 2.5mg s daily except 5mg s on MWF. Recheck in 2 weeks.  Orders faxed to [facility].

## 2010-02-21 NOTE — Progress Notes (Signed)
Summary: LAB PRIOR TO OV  Phone Note From Other Clinic   Summary of Call: Gave verbal order for labs - per MD, CBC prior to apt Thursday, they will run stat on wednesday and fax to [phone].  Initial call taken by: Lamar Sprinkles, CMA,  November 16, 2009 4:42 PM

## 2010-02-21 NOTE — Medication Information (Signed)
Summary: Coumadin Clinic  Anticoagulant Therapy  Managed by: Weston Brass, PharmD Referring MD: Verne Carrow PCP: Tresa Garter MD Supervising MD: Shirlee Latch MD, Jihaad Bruschi Indication 1: Atrial Fibrillation Lab Used: [facility] [place] Site: [address] PT 22.7 INR POC 2.3 INR RANGE 2.0-3.0  Dietary changes: no    Health status changes: no    Bleeding/hemorrhagic complications: no    Recent/future hospitalizations: no    Any changes in medication regimen? no    Recent/future dental: no  Any missed doses?: no       Is patient compliant with meds? yes       Allergies: 1)  ! Macrodantin (Nitrofurantoin Macrocrystal) 2)  ! Codeine Sulfate (Codeine Sulfate) 3)  Atenolol (Atenolol) 4)  Percocet (Oxycodone-Acetaminophen) 5)  Biaxin  Anticoagulation Management History:      The patient is taking warfarin and comes in today for a routine follow up visit.  Positive risk factors for bleeding include an age of 75 years or older, history of GI bleeding, and presence of serious comorbidities.  The bleeding index is 'high risk'.  Positive CHADS2 values include History of HTN and Age > 49 years old.  Her last INR was 1.6 ratio.  Prothrombin time is 22.7.  Anticoagulation responsible provider: Shirlee Latch MD, Hamish Banks.  INR POC: 2.3.  Exp: 11/2010.    Anticoagulation Management Assessment/Plan:      The patient's current anticoagulation dose is Warfarin sodium 5 mg tabs: as directed.  The target INR is 2.0-3.0.  The next INR is due 01/09/2010.  Anticoagulation instructions were given to Daughter.  Results were reviewed/authorized by Weston Brass, PharmD.  She was notified by Weston Brass PharmD.         Prior Anticoagulation Instructions: INR 2.37 LMOM to call for dosing. Bethena Midget, RN, BSN  November 28, 2009 3:41 PM  [facility] for pt to continue same dose and call for dosing. Bethena Midget, RN, BSN  November 28, 2009 4:30 PM   Current Anticoagulation  Instructions: INR 2.3  Spoke with Delice Bison from [facility].  Continue same dose of 1/2 tablet every day except 1 tablet on Monday, Wednesday and Friday.  Recheck INR in 4 weeks.

## 2010-02-21 NOTE — Letter (Signed)
Summary: Appt Reminder 2  [facility]  [address] [place], [place] [address]   Phone: [phone]  Fax: [phone]        October 29, 2009 MRN: [id number]    [name] [address] [place], [place]  [address]    Dear Ms. Hagarty,   You have a return appointment with Dr. Christella Hartigan on 11/30/09 at 3 pm. Please arrive on 11/29/09 at the [facility] lab anytime from 730am-5pm for lab work.  Please remember to bring a complete list of the medicines you are taking, your insurance card and your co-pay.  If you have to cancel or reschedule this appointment, please call before 5:00 pm the evening before to avoid a cancellation fee.  If you have any questions or concerns, please call [phone].    Sincerely,    Chales Abrahams CMA (AAMA)

## 2010-02-21 NOTE — Assessment & Plan Note (Signed)
Summary: 1 MO ROV /NWS   Vital Signs:  Patient profile:   77 year old female Weight:      171 pounds Temp:     97.4 degrees F oral Pulse rate:   61 / minute BP sitting:   100 / 54  (left arm)  Vitals Entered By: Tora Perches (March 19, 2009 1:33 PM) CC: f/u Is Patient Diabetic? No   Primary Care Provider:  Tresa Garter MD  CC:  f/u.  History of Present Illness: The patient presents for a follow up of ARF, hypertension, leg swelling -- better.   Preventive Screening-Counseling & Management  Alcohol-Tobacco     Smoking Status: never  Allergies: 1)  ! Macrodantin (Nitrofurantoin Macrocrystal) 2)  ! Codeine Sulfate (Codeine Sulfate) 3)  ! Atenolol (Atenolol) 4)  Percocet (Oxycodone-Acetaminophen)  Past History:  Social History: Last updated: 07/18/2008 Never Smoked 1 glass of wine per day No illicit drugs Retired [employer] Married, 2 children-ages [phone].   Past Medical History: Paroxysmal Atrial Fibrillation on coumadin therapy Hypertension Low back pain, severe lumbar spondylosis s/p L3/L4, L4/L5 fusion 12/10. Osteoarthritis Osteopenia Elevated mcv 790.09 Constipation  564.00Iischemic colitis with rectal bleeding  558.9 Borderline low B12 Renal insufficiency  Review of Systems  The patient denies chest pain, dyspnea on exertion, and hemoptysis.    Physical Exam  General:  General: Well developed, well nourished, NAD HEENT: OP clear, mucus membranes moist SKIN: warm, dry Neuro: No focal deficits Musculoskeletal: Muscle strength 5/5 all ext Psychiatric: Mood and affect normal Neck: No JVD, no carotid bruits, no thyromegaly, no lymphadenopathy. Lungs:Clear bilaterally, no wheezes, rhonci, crackles CV: RRR no murmurs, gallops rubs Abdomen: soft, NT, ND, BS present   Mouth:  Oral mucosa and oropharynx without lesions or exudates.  Teeth in good repair. Lungs:  Normal respiratory effort, chest expands symmetrically. Lungs are clear  to auscultation, no crackles or wheezes. Heart:  Normal rate and regular rhythm. S1 and S2 normal without gallop, murmur, click, rub or other extra sounds. Abdomen:  Bowel sounds positive,abdomen soft and non-tender without masses, organomegaly or hernias noted. Msk:  Using a walker LS in a brace Extremities:  trace left pedal edema and trace right pedal edema.   Neurologic:  alert & oriented X3.   Skin:  no cellulitis Psych:  Oriented X3.     Impression & Recommendations:  Problem # 1:  EDEMA (ICD-782.3) Assessment Improved  Her updated medication list for this problem includes:    Triamterene-hctz 75-50 Mg Tabs (Triamterene-hctz) .[name]... Take 1 tablet by mouth once a day    Furosemide 40 Mg Tabs (Furosemide) .[name]... 1 by mouth every other day as needed swelling  Problem # 2:  HYPERTENSION (ICD-401.9) Assessment: Improved  Her updated medication list for this problem includes:    Triamterene-hctz 75-50 Mg Tabs (Triamterene-hctz) .[name]... Take 1 tablet by mouth once a day    Benazepril Hcl 20 Mg Tabs (Benazepril hcl) .[name]... 1-2 by mouth daily - take 1 if BP is low    Amlodipine Besylate 5 Mg Tabs (Amlodipine besylate) .[name]... 1/2 by mouth once daily    Metoprolol Tartrate 50 Mg Tabs (Metoprolol tartrate) .[name]... Take one tablet by mouth twice a day    Furosemide 40 Mg Tabs (Furosemide) .[name]... 1 by mouth every other day as needed swelling  Problem # 3:  ATRIAL FIBRILLATION (ICD-427.31) Assessment: Unchanged  Her updated medication list for this problem includes:    Amlodipine Besylate 5 Mg Tabs (Amlodipine besylate) .[name]... 1/2  by mouth once daily    Metoprolol Tartrate 50 Mg Tabs (Metoprolol tartrate) .[name]... Take one tablet by mouth twice a day    Warfarin Sodium 2.5 Mg Tabs (Warfarin sodium) .[name]... Take one tablet daily    Aspirin 81 Mg Tbec (Aspirin) .[name]... Take one tablet by mouth daily  Problem # 4:  LOW BACK PAIN (ICD-724.2) Assessment: Unchanged In PT Her updated medication list for  this problem includes:    Oxycodone-acetaminophen 5-325 Mg Tabs (Oxycodone-acetaminophen) .[name]... As needed    Aspirin 81 Mg Tbec (Aspirin) .[name]... Take one tablet by mouth daily  Problem # 5:  RENAL INSUFFICIENCY (ICD-588.9) Assessment: Unchanged See "Patient Instructions".  Orders: TLB-BMP (Basic Metabolic Panel-BMET) (80048-METABOL)  Complete Medication List: 1)  Temazepam 30 Mg Caps (Temazepam) .... Take 1 tablet by mouth once a day 2)  Triamterene-hctz 75-50 Mg Tabs (Triamterene-hctz) .... Take 1 tablet by mouth once a day 3)  Benazepril Hcl 20 Mg Tabs (Benazepril hcl) .[name].. 1-2 by mouth daily 4)  Amlodipine Besylate 5 Mg Tabs (Amlodipine besylate) .... 1/2 by mouth once daily 5)  Metoprolol Tartrate 50 Mg Tabs (Metoprolol tartrate) .... Take one tablet by mouth twice a day 6)  Warfarin Sodium 2.5 Mg Tabs (Warfarin sodium) .... Take one tablet daily 7)  Oxycodone-acetaminophen 5-325 Mg Tabs (Oxycodone-acetaminophen) .... As needed 8)  Furosemide 40 Mg Tabs (Furosemide) .[name].. 1 by mouth every other day as needed swelling 9)  Vitamin D3 1000 Unit Tabs (Cholecalciferol) .[name].. 1 qd 10)  Aspirin 81 Mg Tbec (Aspirin) .... Take one tablet by mouth daily 11)  Vitamin B Complex Caps (B complex vitamins) .... Take 1 tablet by mouth once a day  Patient Instructions: 1)  Please schedule a follow-up appointment in 6 weeks. 2)  BMP prior to visit, ICD-9:585 3)  Goal BP < 135/85

## 2010-02-21 NOTE — Medication Information (Signed)
Summary: rov/eac  Anticoagulant Therapy  Managed by: Cloyde Reams, RN, BSN Referring MD: Verne Carrow PCP: Tresa Garter MD Supervising MD: Johney Frame MD, Fayrene Fearing Indication 1: Atrial Fibrillation Lab Used: [facility] [place] Site: [address] INR POC 2.6 INR RANGE 2.0-3.0  Dietary changes: no    Health status changes: no    Bleeding/hemorrhagic complications: no    Recent/future hospitalizations: no    Any changes in medication regimen? no    Recent/future dental: no  Any missed doses?: no       Is patient compliant with meds? yes       Allergies: 1)  ! Macrodantin (Nitrofurantoin Macrocrystal) 2)  ! Codeine Sulfate (Codeine Sulfate) 3)  ! Atenolol (Atenolol) 4)  Percocet (Oxycodone-Acetaminophen)  Anticoagulation Management History:      The patient is taking warfarin and comes in today for a routine follow up visit.  Positive risk factors for bleeding include an age of 75 years or older and presence of serious comorbidities.  The bleeding index is 'intermediate risk'.  Positive CHADS2 values include History of HTN and Age > 75 years old.  Her last INR was 2.6.  Anticoagulation responsible provider: Velma Hanna MD, Fayrene Fearing.  INR POC: 2.6.  Cuvette Lot#: [id number].  Exp: 09/2010.    Anticoagulation Management Assessment/Plan:      The patient's current anticoagulation dose is Warfarin sodium 5 mg tabs: Use as directed by Anticoagulation Clinic.  The target INR is 2.0-3.0.  The next INR is due 08/09/2009.  Anticoagulation instructions were given to patient.  Results were reviewed/authorized by Cloyde Reams, RN, BSN.  She was notified by Cloyde Reams RN.         Prior Anticoagulation Instructions: INR 2.1  Continue taking 1/2 tablet on Tues and Thursday and 1 tablet all other days.  Return to clinic in 4 weeks.    Current Anticoagulation Instructions: INR 2.6  Continue on same dosage 1 tablet daily except 1/2 tablet on Tuesdays and Thursdays.   Recheck in 4 weeks.

## 2010-02-21 NOTE — Medication Information (Signed)
Summary: rov/ewj  Anticoagulant Therapy  Managed by: Weston Brass, PharmD Referring MD: Verne Carrow PCP: Georgina Quint [name] MD Supervising MD: Johney Frame MD, Fayrene Fearing Indication 1: Atrial Fibrillation INR POC 3.7  Dietary changes: no    Health status changes: no    Bleeding/hemorrhagic complications: no    Recent/future hospitalizations: no    Any changes in medication regimen? no    Recent/future dental: no  Any missed doses?: no       Is patient compliant with meds? yes       Current Medications (verified): 1)  Celebrex 200 Mg  Caps (Celecoxib) .... Once Daily 2)  Temazepam 30 Mg  Caps (Temazepam) .... Take 1 Tablet By Mouth Once A Day 3)  Triamterene-Hctz 75-50 Mg  Tabs (Triamterene-Hctz) .... Take 1 Tablet By Mouth Once A Day 4)  Vitamin B Complex   Caps (B Complex Vitamins) .... Take 1 Tablet By Mouth Once A Day 5)  Vitamin D3 1000 Unit  Tabs (Cholecalciferol) .[name].. 1 Qd 6)  Benazepril Hcl 20 Mg  Tabs (Benazepril Hcl) .... 2 By Mouth Daily 7)  Amlodipine Besylate 5 Mg  Tabs (Amlodipine Besylate) .[name].. 1 By Mouth Qd 8)  Aspirin 81 Mg Tbec (Aspirin) .... Take One Tablet By Mouth Daily 9)  Toprol Xl 25 Mg Xr24h-Tab (Metoprolol Succinate) .... Take 1 Tablet Two Times A Day 10)  Tramadol Hcl 50 Mg  Tabs (Tramadol Hcl) .... As Needed 11)  Warfarin Sodium 5 Mg Tabs (Warfarin Sodium) .... Take 1 Tablet Daily  Allergies (verified): 1)  ! Macrodantin (Nitrofurantoin Macrocrystal) 2)  ! Codeine Sulfate (Codeine Sulfate) 3)  ! Atenolol (Atenolol) 4)  Percocet (Oxycodone-Acetaminophen)  Anticoagulation Management History:      The patient is taking warfarin and comes in today for a routine follow up visit.  Positive risk factors for bleeding include an age of 51 years or older.  The bleeding index is 'intermediate risk'.  Positive CHADS2 values include History of HTN and Age > 31 years old.  Anticoagulation responsible provider: Annalise Mcdiarmid MD, Fayrene Fearing.  INR POC: 3.7.  Cuvette Lot#:  [id number].  Exp: 11/20/2009.    Anticoagulation Management Assessment/Plan:      The patient's current anticoagulation dose is Warfarin sodium 5 mg tabs: take 1 tablet daily.  The next INR is due 11/06/2008.  Results were reviewed/authorized by Weston Brass, PharmD.  She was notified by Fanny Skates, PharmD Candidate.         Prior Anticoagulation Instructions: The patient is to continue with the same dose of coumadin.  This dosage includes: Take 1 tablet (5mg ) every day in the evening.  Current Anticoagulation Instructions: INR 3.7  Skip dose tonight (Monday).  Then start new dosing schedule of 1/2 tablet on Fridays, and 1 tablet on all other days.  Return to clinic in 1 week.

## 2010-02-21 NOTE — Assessment & Plan Note (Signed)
Summary: 6wk f/u   Visit Type:  6 wk f/u Primary Provider:  Tresa Garter MD  CC:  no cardiac complaints today.  History of Present Illness: 77 yo WF with history of HTN who was recently diagnosed with paroxysmal atrial fibrillation.  She was unaware of this and did not feel any palpitations, fluttering, chest pain or SOB. She was referred for further evaluation 07/18/08. She has not been aware of any irregularities of her heart rhythm in the past. She has had no prior cardiac issues. She does not abuse alcohol and does not drink excessive caffeine. She wore a 21 day event monitor which showed episodes of atrial fibrillation  She has never been aware of her irregular heart rhythm. Her only complaint has been  lower back and hip pain. She currently has plans for back surgery in several weeks. At the last visit, she was in atrial fibrillation and we decided to start coumadin therapy. Her rate was 111 bpm and her Toprol was increased at that time. She has felt well since then. She does have mild weakness and overall fatigue but her biggest complaint is the lower back pain.   Current Medications (verified): 1)  Celebrex 200 Mg  Caps (Celecoxib) .... Once Daily 2)  Temazepam 30 Mg  Caps (Temazepam) .... Take 1 Tablet By Mouth Once A Day 3)  Triamterene-Hctz 75-50 Mg  Tabs (Triamterene-Hctz) .... Take 1 Tablet By Mouth Once A Day 4)  Vitamin B Complex   Caps (B Complex Vitamins) .... Take 1 Tablet By Mouth Once A Day 5)  Vitamin D3 1000 Unit  Tabs (Cholecalciferol) .[name].. 1 Qd 6)  Benazepril Hcl 20 Mg  Tabs (Benazepril Hcl) .... 2 By Mouth Daily 7)  Amlodipine Besylate 5 Mg  Tabs (Amlodipine Besylate) .[name].. 1 By Mouth Qd 8)  Aspirin 81 Mg Tbec (Aspirin) .... Take One Tablet By Mouth Daily 9)  Toprol Xl 25 Mg Xr24h-Tab (Metoprolol Succinate) .... Take 1 Tablet Two Times A Day 10)  Tramadol Hcl 50 Mg  Tabs (Tramadol Hcl) .... As Needed 11)  Warfarin Sodium 5 Mg Tabs (Warfarin Sodium) .... Take 1  Tablet Daily  Allergies: 1)  ! Macrodantin (Nitrofurantoin Macrocrystal) 2)  ! Codeine Sulfate (Codeine Sulfate) 3)  ! Atenolol (Atenolol) 4)  Percocet (Oxycodone-Acetaminophen)  Past History:  Past Medical History: Paroxysmal Atrial Fibrillation on coumadin therapy Hypertension Low back pain, severe lumbar spondylosis  Osteoarthritis Osteopenia Elevated mcv 790.09 Constipation  564.00Iischemic colitis with rectal bleeding  558.9 Borderline low B12  Past Surgical History: Reviewed history from 07/18/2008 and no changes required. Left rotator cuff surgery 1997 Tonsillectomy  Family History: Reviewed history from 07/18/2008 and no changes required. Family History Hypertension Mother deceased from unknown causes Father deceased from cancer 1 sister, does not know her  Social History: Reviewed history from 07/18/2008 and no changes required. Never Smoked 1 glass of wine per day No illicit drugs Retired [name] Married, 2 children-ages [phone].   Review of Systems       The patient complains of fatigue, malaise, and joint pain.  The patient denies fever, weight gain/loss, chest pain, palpitations, shortness of breath, prolonged cough, wheezing, sleep apnea, coughing up blood, abdominal pain, blood in stool, nausea, vomiting, diarrhea, heartburn, incontinence, blood in urine, muscle weakness, leg swelling, rash, skin lesions, headache, fainting, dizziness, depression, anxiety, enlarged lymph nodes, easy bruising or bleeding, and environmental allergies.    Vital Signs:  Patient profile:   77 year old female Height:  64 inches Weight:      175 pounds BMI:     30.15 Pulse rate:   64 / minute Pulse rhythm:   regular BP sitting:   123 / 66  (left arm) Cuff size:   large  Vitals Entered By: Danielle Rankin, CMA (December 04, 2008 11:56 AM)  Physical Exam  General:  General: Well developed, well nourished, NAD HEENT: OP clear, mucus membranes moist SKIN:  warm, dry Neuro: No focal deficits Musculoskeletal: Muscle strength 5/5 all ext Psychiatric: Mood and affect normal Neck: No JVD, no carotid bruits, no thyromegaly, no lymphadenopathy. Lungs:Clear bilaterally, no wheezes, rhonci, crackles CV: RRR no murmurs, gallops rubs Abdomen: soft, NT, ND, BS present Extremities: No edema, pulses 2+.    Impression & Recommendations:  Problem # 1:  ATRIAL FIBRILLATION (ICD-427.31) She is in sinus rhythm today. No medication changes. Continue coumadin therapy and Toprol XL.   Her updated medication list for this problem includes:    Aspirin 81 Mg Tbec (Aspirin) .[name]... Take one tablet by mouth daily    Toprol Xl 25 Mg Xr24h-tab (Metoprolol succinate) .[name]... Take 1 tablet two times a day    Warfarin Sodium 5 Mg Tabs (Warfarin sodium) .[name]... Take 1 tablet daily  Problem # 2:  LOW BACK PAIN (ICD-724.2) Secondary to severe lumbar spondylosis. Plans for surgery January 02, 2009 per Dr. Trey Sailors. From a cardiac standpoint, she does not need futher cardiac workup before the planned surgical procedure. She is known to have normal LV systolic function by ECHO. Her coumadin can be safely held one week prior to surgical procedure. It should be restarted when safe from surgical standpoint following the procedure. She will continue to follow in our coumadin clinic.  Patient Instructions: 1)  Your physician recommends that you schedule a follow-up appointment in: 4 months

## 2010-02-21 NOTE — Progress Notes (Signed)
Summary: need med for itch  Phone Note Call from Patient Call back at Home Phone [phone]   Caller: Daughter Summary of Call: Pt's daughter called saying her mother is itching at night.  They have tried OTC meds, but did not work.  Can you pls tell her which med is causing her to itch or recommend something for the itching? Initial call taken by: Jarome Lamas,  November 20, 2009 10:20 AM  Follow-up for Phone Call        Hold Tramadol x 2 d - see if better Use Hydroxyzine for itching prn Follow-up by: Tresa Garter MD,  November 20, 2009 1:09 PM  Additional Follow-up for Phone Call Additional follow up Details #1::        Daughter notified but takes that pt has not been taking tramadol. It was D/C during hospital visit. Any other possible causes?    Additional Follow-up for Phone Call Additional follow up Details #2::    Toprol or Cardizem would be my 2nd guess Follow-up by: Tresa Garter MD,  November 20, 2009 5:00 PM  Additional Follow-up for Phone Call Additional follow up Details #3:: Details for Additional Follow-up Action Taken: Pt informed and expressed understanding. Pt also advised that she has appt with AVP 11/03 and can discuss further at that time.  Additional Follow-up by: Margaret Pyle, CMA,  November 21, 2009 9:46 AM  New/Updated Medications: HYDROXYZINE HCL 25 MG TABS (HYDROXYZINE HCL) 1-2 tabs by mouth two times a day as needed itching Prescriptions: HYDROXYZINE HCL 25 MG TABS (HYDROXYZINE HCL) 1-2 tabs by mouth two times a day as needed itching  #60 x 3   Entered and Authorized by:   Tresa Garter MD   Signed by:   Rock Nephew CMA on 11/20/2009   Method used:   [facility] to        [facility] [address] [phone]* (retail)       [address]       [place], [place]  [address]       Ph: [phone] or [phone]       Fax: [phone]   RxID:   [id number]

## 2010-02-21 NOTE — Assessment & Plan Note (Signed)
Summary: Dr Posey Rea pt COUGH SD   Vital Signs:  Patient Profile:   77 Years Old Female Temp:     98.5 degrees F oral Pulse rate:   93 / minute BP sitting:   118 / 71  (right arm)  Vitals Entered By: Glendell Docker (February 25, 2007 4:05 PM)                 Chief Complaint:  Cough.  History of Present Illness:  Cough      This is a 77 year old woman who presents with Cough.  The patient reports productive cough, but denies shortness of breath.  Ineffective prior treatments have included OTC cough medication.    Current Allergies (reviewed today): ! MACRODANTIN (NITROFURANTOIN MACROCRYSTAL) ! CODEINE SULFATE (CODEINE SULFATE) ! ATENOLOL (ATENOLOL)  Past Medical History:    Reviewed history from 11/30/2006 and no changes required:       Hypertension       Low back pain       Osteoarthritis       Osteopenia       elevated mcv 790.09       constipation  564.00       ischemic colitis with rectal bleeding  558.9       borderline low b12   Social History:    Never Smoked   Risk Factors:  Tobacco use:  never    Physical Exam  General:     alert, well-developed, and well-nourished.   Ears:     R ear normal and L ear normal.   Mouth:     Oral mucosa and oropharynx without lesions or exudates.   Neck:     supple and no masses.   Lungs:     bilateral coarse breath sounds,normal respiratory effort, no dullness, R wheezes, and L wheezes.   Heart:     normal rate, regular rhythm, and no murmur.   Extremities:     No lower extremity edema  Psych:     normally interactive.      Impression & Recommendations:  Problem # 1:  ACUTE BRONCHITIS (ICD-466.0) O2 Sat 96 % RA.  Pt with 3-4 days of productive cough and associated chest tightness.  F/U with Dr. Posey Rea next week.  Patient advised to call office if symptoms worsen.   Her updated medication list for this problem includes:    Avelox 400 Mg Tabs (Moxifloxacin hcl) ..... One by mouth once daily  (Samples provided for 7 day course)    Prednisone taper 30 mg to 10 mg over 9 days.    Sample of ventolin inhaler provided. Her updated medication list for this problem includes:    Avelox 400 Mg Tabs (Moxifloxacin hcl) ..... One by mouth once daily   Complete Medication List: 1)  Celebrex 200 Mg Caps (Celecoxib) .... Once daily 2)  Caltrate 600+d Plus 600-400 Mg-unit Tabs (Calcium carbonate-vit d-min) .... Take 1 tablet by mouth once a day 3)  Tarka 4-240 Mg Tbcr (Trandolapril-verapamil hcl) .... Take 1 tablet by mouth once a day 4)  Temazepam 30 Mg Caps (Temazepam) .... Take 1 tablet by mouth once a day 5)  Triamterene-hctz 75-50 Mg Tabs (Triamterene-hctz) .... Take 1 tablet by mouth once a day 6)  Intrinsi B12-folate 800-500-20 Mcg-mcg-mg Tabs (Folate-b12-intrinsic factor) .... Take 1 tablet by mouth once a day 7)  Vitamin B Complex Caps (B complex vitamins) .... Take 1 tablet by mouth once a day 8)  Folic Acid 1  Mg Tabs (Folic acid) .... 2mg  once daily 9)  Prednisone 10 Mg Tabs (Prednisone) .... 3 tabs by mouth once daily x 3 days, 2 tabs  by mouth once daily x 3 days, 1 tab by mouth once daily x 3 days 10)  Avelox 400 Mg Tabs (Moxifloxacin hcl) .... One by mouth once daily   Patient Instructions: 1)  Follow up with Dr. Posey Rea next week.    Prescriptions: AVELOX 400 MG  TABS (MOXIFLOXACIN HCL) one by mouth once daily  #7 x 0   Entered and Authorized by:   Dondra Spry DO   Signed by:   Dondra Spry DO on 02/25/2007   Method used:   Historical   RxID:   [id number] PREDNISONE 10 MG  TABS (PREDNISONE) 3 tabs by mouth once daily x 3 days, 2 tabs  by mouth once daily x 3 days, 1 tab by mouth once daily x 3 days  #18 x 0   Entered and Authorized by:   Dondra Spry DO   Signed by:   Dondra Spry DO on 02/25/2007   Method used:   Electron[facility] to ...       [facility] [address] [phone]*       [address]       [place], [place]  [address]        Ph: [phone] or [phone]       Fax: [phone]   RxID:   [id number]  ]

## 2010-02-21 NOTE — Progress Notes (Signed)
Summary: CALL  Phone Note Call from Patient Call back at Home Phone [phone]   Summary of Call: Patient is requesting a call regarding an rx Initial call taken by: Lamar Sprinkles, CMA,  December 07, 2009 10:30 AM  Follow-up for Phone Call        left mess to call office back...............Marland KitchenLamar Sprinkles, CMA  December 07, 2009 3:59 PM   Spoke with patient who stated that the rx for iron was for 15days only. I advised that it should be 60 and that I would correct with pharmacy. [facility].Alvy Beal Archie CMA  December 10, 2009 10:17 AM

## 2010-02-21 NOTE — Assessment & Plan Note (Signed)
Review of gastrointestinal problems: 1. Gastritis, gastric ulcers; H. pylori positive by EGD, biopsies October 2011. Presented with melena.    History of Present Illness Primary GI MD: Rob Bunting MD Primary Provider: Tresa Garter MD Requesting Provider: Tresa Garter MD Chief Complaint: 4-5 weeks f/u. Had iron infusion today. Pt still feels fatigued.  History of Present Illness:     very pleasant 77 year old who is here with her husband and daughter today. She was hospitalized for about 5 days, discharged about 2 weeks ago. This was for what sounds like symptomatic chronic anemia, went into rapid A. fib while she was in the hospital.  she is gaining her strength back slowly since hosp admission.  HAs dark stools but different look than previous (melanci stools), probaby oral iron related.  she completed her Prevpac and antibiotics. I           Current Medications (verified): 1)  Temazepam 30 Mg  Caps (Temazepam) .... Take 1 Tablet By Mouth Once A Day 2)  Furosemide 40 Mg Tabs (Furosemide) .[name].. 1 By Mouth Every Other Day As Needed Swelling 3)  Vitamin D3 1000 Unit  Tabs (Cholecalciferol) .[name].. 1 Qd 4)  Vitamin B Complex   Caps (B Complex Vitamins) .... Take 1 Tablet By Mouth Once A Day 5)  Synthroid 25 Mcg Tabs (Levothyroxine Sodium) .[name].. 1 By Mouth Once Daily For Thyroid 6)  Cardizem Cd 120 Mg Xr24h-Cap (Diltiazem Hcl Coated Beads) .[name].. 1 Cap Once Daily 7)  Metoprolol Succinate 50 Mg Xr24h-Tab (Metoprolol Succinate) .... Take One Tablet By Mouth Two Times A Day. 8)  Protonix 40 Mg Tbec (Pantoprazole Sodium) .[name].. 1 Tab Once Daily 9)  Warfarin Sodium 5 Mg Tabs (Warfarin Sodium) .... As Directed 10)  Hydroxyzine Hcl 25 Mg Tabs (Hydroxyzine Hcl) .[name].. 1-2 Tabs By Mouth Two Times A Day As Needed Itching 11)  Ferrex 150 Forte 150-25-1 Mg-Mcg-Mg Caps (Iron Polysacch Cmplx-B12-Fa) .[name].. 1 By Mouth Two Times A Day  Take With Vit C 250 Mg Two Times A Day 12)  Wheelchair ....  As Dirr 13)  Loratadine 10 Mg Tabs (Loratadine) .[name].. 1 By Mouth Once Daily As Needed Allergies  Allergies (verified): 1)  ! Macrodantin (Nitrofurantoin Macrocrystal) 2)  ! Codeine Sulfate (Codeine Sulfate) 3)  Atenolol (Atenolol) 4)  Percocet (Oxycodone-Acetaminophen) 5)  Biaxin  Vital Signs:  Patient profile:   77 year old female Height:      64 inches Weight:      174.38 pounds BMI:     30.04 Pulse rate:   90 / minute Pulse rhythm:   regular BP sitting:   124 / 62  (left arm) Cuff size:   regular  Vitals Entered By: Christie Nottingham CMA Duncan Dull) (November 30, 2009 3:02 PM)  Physical Exam  Additional Exam:  Constitutional: fatigue, chronically ill-appearing Psychiatric: alert and oriented times 3 Abdomen: soft, non-tender, non-distended, normal bowel sounds    Impression & Recommendations:  Problem # 1:  gastric ulcers she is on oral iron now and also had IV iron infusion today and will have that again in a week from today. She does not have any clear, recurrent GI bleeding but it is difficult to tell while being on iron. I recommended she stay off of aspirin for another few weeks at least, she can stay on Coumadin. We will arrange for repeat EGD in about 2 months time and she is planning to see her primary care physician in about one month. She is to call  if she has any clear overt GI bleeding.  Patient Instructions: 1)  Repeat EGD in 2 months, can stay on coumadin for this. 2)  Stay on protonix once daily. 3)  Stay on iron orally. 4)  Call Dr. Christella Hartigan' office if you have obvious GI bleeding. 5)  Stay off apirin for now. 6)  The medication list was reviewed and reconciled.  All changed / newly prescribed medications were explained.  A complete medication list was provided to the patient / caregiver.

## 2010-02-21 NOTE — Consult Note (Signed)
Summary: [facility]  [facility]   Imported By: Roderic Ovens 12/01/2009 13:28:14  _____________________________________________________________________  External Attachment:    Type:   Image     Comment:   External Document

## 2010-02-21 NOTE — Letter (Signed)
Summary: [facility]  [facility]   Imported By: Lester [place] 04/18/2009 11:39:14  _____________________________________________________________________  External Attachment:    Type:   Image     Comment:   External Document

## 2010-02-21 NOTE — Letter (Signed)
Summary: [facility]  [facility]   Imported By: Sherian Rein 12/10/2009 13:53:19  _____________________________________________________________________  External Attachment:    Type:   Image     Comment:   External Document

## 2010-02-21 NOTE — Progress Notes (Signed)
Summary: MED CHANGE  Phone Note Call from Patient   Summary of Call: Daughter had more questions about medications. Metoprolol succinate was written 25mg  1 two times a day. BUT dosage was written 75mg . Daughter was confused. Confirmed by MD that pt is to take 25mg  1 two times a day. Daughter aware.   Also had question about thyroid medication, should it be on empty stomach? Verified by pharmacy, this is correct. Also, patient needs to take calcium 2 hours after throid med. Daughter understands.   Nurse in home today BP 112/72 and pulse 98. She will continue to monitor and call back w/report.  Initial call taken by: Lamar Sprinkles, CMA,  November 14, 2009 3:00 PM  Follow-up for Phone Call        Agree Thank you!  Follow-up by: Tresa Garter MD,  November 14, 2009 5:24 PM    New/Updated Medications: METOPROLOL SUCCINATE 25 MG XR24H-TAB (METOPROLOL SUCCINATE) 1 two times a day

## 2010-02-21 NOTE — Progress Notes (Signed)
Summary: TSH lab order  Phone Note From Other Clinic   Caller: [facility] Summary of Call: Order for pt's TSH needs to be faxed to [facility] since they do not take verbal orders-fax [phone] Initial call taken by: Brenton Grills CMA Duncan Dull),  November 21, 2009 1:45 PM  Follow-up for Phone Call        ok  Follow-up by: Tresa Garter MD,  November 21, 2009 5:47 PM  Additional Follow-up for Phone Call Additional follow up Details #1::        order faxed Additional Follow-up by: Lanier Prude, [facility]),  November 22, 2009 9:20 AM

## 2010-02-21 NOTE — Medication Information (Signed)
Summary: Coumadin Clinic  Anticoagulant Therapy  Managed by: Bethena Midget, RN, BSN Referring MD: Verne Carrow PCP: Georgina Quint [name] MD Supervising MD: Gala Romney MD, Reuel Boom Indication 1: Atrial Fibrillation Lab Used: [facility] [place] Site: [address] PT 20.4 INR POC 1.73 INR RANGE 2.0-3.0  Dietary changes: no    Health status changes: no    Bleeding/hemorrhagic complications: no    Recent/future hospitalizations: yes       Details: Pt recently admitted to [facility] 11/08/09 discharged on 11/13/09  Any changes in medication regimen? no    Recent/future dental: no  Any missed doses?: no       Is patient compliant with meds? yes      Comments: Pt received 5mg s daily while in hospital. Daugher(Pam) states that pt accidently got 10mg s yesterday.   Allergies: 1)  ! Macrodantin (Nitrofurantoin Macrocrystal) 2)  ! Codeine Sulfate (Codeine Sulfate) 3)  ! Atenolol (Atenolol) 4)  Percocet (Oxycodone-Acetaminophen)  Anticoagulation Management History:      Her anticoagulation is being managed by telephone today.  Positive risk factors for bleeding include an age of 75 years or older, history of GI bleeding, and presence of serious comorbidities.  The bleeding index is 'high risk'.  Positive CHADS2 values include History of HTN and Age > 75 years old.  Her last INR was 1.6 ratio.  Prothrombin time is 20.4.  Anticoagulation responsible provider: Bensimhon MD, Reuel Boom.  INR POC: 1.73.    Anticoagulation Management Assessment/Plan:      The target INR is 2.0-3.0.  The next INR is due 11/15/2009.  Anticoagulation instructions were given to Daughter.  Results were reviewed/authorized by Bethena Midget, RN, BSN.  She was notified by Bethena Midget, RN, BSN.         Prior Anticoagulation Instructions: INR 3.1  Today, Thursday, October 6th, do not take Coumadin 2.5 mg (0.5 tab). Then, continue taking Coumadin 5 mg (1 tab) on Mon, Wed, Fri and continue taking Couamdin  2.5 mg (0.5 tab) on Sun, Tues, Thur, Sat. Return to clinic in 3 weeks.     Current Anticoagulation Instructions: INR 1.73 Skip today and tomorrow take 2.5mg s then resume 2.5mg  daily except 5mg s MWF. Orders sent to [facility].   Appended Document: Coumadin Clinic [facility] to recheck INR on 11/21/09

## 2010-02-21 NOTE — Assessment & Plan Note (Signed)
Summary: 4 MO FU/$50/PN   Vital Signs:  Patient Profile:   77 Years Old Female Weight:      161 pounds Temp:     97.8 degrees F oral Pulse rate:   84 / minute BP sitting:   140 / 70  (left arm)  Vitals Entered By: Tora Perches (October 05, 2007 1:20 PM)                 Chief Complaint:  Multiple medical problems or concerns.  History of Present Illness: The patient presents for a follow up of hypertension, OA , hyperlipidemia. C/o low grade fever and fatigue last wk w/o any more specific sx's.     Current Allergies (reviewed today): ! MACRODANTIN (NITROFURANTOIN MACROCRYSTAL) ! CODEINE SULFATE (CODEINE SULFATE) ! ATENOLOL (ATENOLOL)  Past Medical History:    Reviewed history from 06/01/2007 and no changes required:       Hypertension       Low back pain, spinal stenosis       Osteoarthritis       Osteopenia       Elevated mcv 790.09       Constipation  564.00Iischemic colitis with rectal bleeding  558.9       Borderline low B12   Family History:    Reviewed history from 03/02/2007 and no changes required:       Family History Hypertension  Social History:    Reviewed history from 03/02/2007 and no changes required:       Never Smoked       Retired       Married    Review of Systems  The patient denies chest pain, headaches, abdominal pain, melena, difficulty walking, angioedema, syncope, and dyspnea on exertion.     Physical Exam  General:     Well-developed,well-nourished,in no acute distress; alert,appropriate and cooperative throughout examination Head:     Normocephalic and atraumatic without obvious abnormalities. No apparent alopecia or balding. Nose:     External nasal examination shows no deformity or inflammation. Nasal mucosa are pink and moist without lesions or exudates. Mouth:     Oral mucosa and oropharynx without lesions or exudates.  Teeth in good repair. Neck:     No deformities, masses, or tenderness noted. Lungs:     Normal  respiratory effort, chest expands symmetrically. Lungs are clear to auscultation, no crackles or wheezes. Heart:     Normal rate and regular rhythm. S1 and S2 normal without gallop, murmur, click, rub or other extra sounds. Abdomen:     Bowel sounds positive,abdomen soft and non-tender without masses, organomegaly or hernias noted. Msk:     No deformity or scoliosis noted of thoracic or lumbar spine.   Extremities:     No clubbing, cyanosis, edema, or deformity noted with normal full range of motion of all joints.   Neurologic:     No cranial nerve deficits noted. Station and gait are normal. Plantar reflexes are down-going bilaterally. DTRs are symmetrical throughout. Sensory, motor and coordinative functions appear intact. Skin:     Intact without suspicious lesions or rashes Psych:     Cognition and judgment appear intact. Alert and cooperative with normal attention span and concentration. No apparent delusions, illusions, hallucinations    Impression & Recommendations:  Problem # 1:  HYPERTENSION (ICD-401.9) Assessment: Unchanged  Her updated medication list for this problem includes:    Triamterene-hctz 75-50 Mg Tabs (Triamterene-hctz) .[name]... Take 1 tablet by mouth once a day  Benazepril Hcl 20 Mg Tabs (Benazepril hcl) .[name]... 2 by mouth daily    Amlodipine Besylate 5 Mg Tabs (Amlodipine besylate) .[name]... 1 by mouth qd  BP today: 140/70 Prior BP: 136/74 (06/01/2007)  Labs Reviewed: Creat: 0.8 (02/27/2006)   Problem # 2:  LOW BACK PAIN (ICD-724.2) Assessment: Unchanged  Her updated medication list for this problem includes:    Celebrex 200 Mg Caps (Celecoxib) ..... Once daily   Problem # 3:  FEVER UNSPECIFIED (ICD-780.60) Assessment: New R/o UTI Orders: TLB-Udip w/ Micro (81001-URINE) TLB-CBC Platelet - w/Differential (85025-CBCD) TLB-BMP (Basic Metabolic Panel-BMET) (80048-METABOL) TLB-TSH (Thyroid Stimulating Hormone) (84443-TSH)   Problem # 4:  OSTEOPENIA  (ICD-733.90) Assessment: Unchanged  Her updated medication list for this problem includes:    Vitamin D3 1000 Unit Tabs (Cholecalciferol) .[name]... 1 qd   Problem # 5:  FATIGUE (ICD-780.79) Assessment: New Better now. Call or RTC if reccures  Complete Medication List: 1)  Celebrex 200 Mg Caps (Celecoxib) .... Once daily 2)  Temazepam 30 Mg Caps (Temazepam) .... Take 1 tablet by mouth once a day 3)  Triamterene-hctz 75-50 Mg Tabs (Triamterene-hctz) .... Take 1 tablet by mouth once a day 4)  Avelox 400 Mg Tabs (Moxifloxacin hcl) .... One by mouth once daily 5)  Vitamin B Complex Caps (B complex vitamins) .... Take 1 tablet by mouth once a day 6)  Vitamin D3 1000 Unit Tabs (Cholecalciferol) .[name].. 1 qd 7)  Benazepril Hcl 20 Mg Tabs (Benazepril hcl) .... 2 by mouth daily 8)  Amlodipine Besylate 5 Mg Tabs (Amlodipine besylate) .[name].. 1 by mouth qd 9)  Cipro 250 Mg Tabs (Ciprofloxacin hcl) .[name].. 1 by mouth 2 times daily   Patient Instructions: 1)  Please schedule a follow-up appointment in 4 months.   Prescriptions: CIPRO 250 MG TABS (CIPROFLOXACIN HCL) 1 by mouth 2 times daily  #20 x 0   Entered and Authorized by:   Tresa Garter MD   Signed by:   Tora Perches on 10/07/2007   Method used:   [facility]        [facility] [address] [phone]* (retail)       [address]       [place], [place]  [address]       Ph: [phone] or [phone]       Fax: [phone]   RxID:   [id number]  ]

## 2010-02-21 NOTE — Letter (Signed)
Summary: [facility]  [facility]   Imported By: Lennie Odor 01/18/2010 12:04:02  _____________________________________________________________________  External Attachment:    Type:   Image     Comment:   External Document

## 2010-02-21 NOTE — Progress Notes (Signed)
Summary: B/P READINGS AND B/P IS LOW  Phone Note Other Incoming   Caller: [facility] [phone] Edilia Bo Summary of Call: PT B/P IS RUNNING LOW 104/56,102/54,112/52, 1118/56 READINGS  FROM 12/30/10TO 01/29/09 Initial call taken by: Judie Grieve,  January 29, 2009 11:05 AM  Follow-up for Phone Call        Spoke with Angie [facility] nurse) who reports pt's BP as above. Heart rate 77 and irregular (pt with known history of PAF). Pt without complaints. Meds reviewed and current med list is up to date. Per Dr. Clifton James BP is OK and no change in meds needed. Angie made aware and instructed her to have pt call if dizzy or other symptoms occur Follow-up by: Dossie Arbour, RN, BSN,  January 29, 2009 11:30 AM

## 2010-02-21 NOTE — Assessment & Plan Note (Signed)
Summary: 1 MO ROV /NWS   Vital Signs:  Patient profile:   77 year old female Height:      64 inches Weight:      172 pounds BMI:     29.63 Temp:     98.2 degrees F oral Pulse rate:   72 / minute Pulse rhythm:   regular Resp:     16 per minute BP sitting:   116 / 68  (left arm) Cuff size:   regular  Vitals Entered By: Lanier Prude, CMA(AAMA) (December 24, 2009 1:55 PM) CC: 1 mo f/u  Is Patient Diabetic? No   Primary Care Provider:  Tresa Garter MD  CC:  1 mo f/u .  History of Present Illness: The patient presents for a follow up of hypertension, anemia, hyperlipidemia, elevated ESR   Current Medications (verified): 1)  Temazepam 30 Mg  Caps (Temazepam) .... Take 1 Tablet By Mouth Once A Day 2)  Furosemide 40 Mg Tabs (Furosemide) .[name].. 1 By Mouth Every Other Day As Needed Swelling 3)  Vitamin D3 1000 Unit  Tabs (Cholecalciferol) .[name].. 1 Qd 4)  Vitamin B Complex   Caps (B Complex Vitamins) .... Take 1 Tablet By Mouth Once A Day 5)  Synthroid 25 Mcg Tabs (Levothyroxine Sodium) .[name].. 1 By Mouth Once Daily For Thyroid 6)  Cardizem Cd 120 Mg Xr24h-Cap (Diltiazem Hcl Coated Beads) .[name].. 1 Cap Once Daily 7)  Metoprolol Succinate 50 Mg Xr24h-Tab (Metoprolol Succinate) .... Take One Tablet By Mouth Two Times A Day. 8)  Protonix 40 Mg Tbec (Pantoprazole Sodium) .[name].. 1 Tab Once Daily 9)  Warfarin Sodium 5 Mg Tabs (Warfarin Sodium) .... As Directed 10)  Hydroxyzine Hcl 25 Mg Tabs (Hydroxyzine Hcl) .[name].. 1-2 Tabs By Mouth Two Times A Day As Needed Itching 11)  Ferrex 150 Forte 150-25-1 Mg-Mcg-Mg Caps (Iron Polysacch Cmplx-B12-Fa) .[name].. 1 By Mouth Two Times A Day  Take With Vit C 250 Mg Two Times A Day 12)  Wheelchair .... As Dirr 13)  Loratadine 10 Mg Tabs (Loratadine) .[name].. 1 By Mouth Once Daily As Needed Allergies  Allergies (verified): 1)  ! Macrodantin (Nitrofurantoin Macrocrystal) 2)  ! Codeine Sulfate (Codeine Sulfate) 3)  Atenolol (Atenolol) 4)  Percocet  (Oxycodone-Acetaminophen) 5)  Biaxin  Past History:  Past Medical History: Last updated: 11/22/2009 Paroxysmal Atrial Fibrillation on coumadin therapy Hypertension Low back pain, severe lumbar spondylosis s/p L3/L4, L4/L5 fusion 12/10. Osteoarthritis Dr Juliene Pina Osteopenia Elevated mcv 790.09 Constipation  564.00Iischemic colitis with rectal bleeding  558.9 Borderline low B12 Renal insufficiency Hypothyroidism 2011  NSAID gastropathy w/bleed 2011  Anemia-iron deficiency 2011and elev. MCV Peptic ulcer disease  Past Surgical History: Last updated: 10/26/2009 Left rotator cuff surgery 1997 Tonsillectomy Back surgery-12/10 L3/L4/L5 fusion    Social History: Last updated: 10/26/2009 Never Smoked 1 glass of wine per day No illicit drugs Retired [employer] Married, 2 children-ages [phone].     Review of Systems       The patient complains of difficulty walking.  The patient denies fever, chest pain, dyspnea on exertion, abdominal pain, and severe indigestion/heartburn.         Less LBP  Physical Exam  General:  Not ill-appearing , not pale, NAD Head:  Normocephalic and atraumatic without obvious abnormalities. No apparent alopecia or balding. Nose:  External nasal examination shows no deformity or inflammation. Nasal mucosa are pink and moist without lesions or exudates. Mouth:  Not dry Neck:  supple Lungs:  CTA Heart:  Irreg  irreg Abdomen:  Bowel sounds positive,abdomen soft and non-tender without masses, organomegaly or hernias noted. Msk:  Using a cane Lumbar-sacral spine is tender to palpation over paraspinal muscles and painfull with the ROM  Extremities:  no edema Neurologic:  alert & oriented X3.   Skin:  No eryth patchy rash on back Psych:  Oriented X3.  Not depressed or anxious   Impression & Recommendations:  Problem # 1:  ANEMIA, IRON DEFICIENCY (ICD-280.9) Assessment Improved She had IV Iron x 2 Her updated medication list for this problem  includes:    Ferrex 150 Forte 150-25-1 Mg-mcg-mg Caps (Iron polysacch cmplx-b12-fa) .[name]... 1 by mouth two times a day  take with vit c 250 mg two times a day  Problem # 2:  LOW BACK PAIN (ICD-724.2) Assessment: Unchanged  Her updated medication list for this problem includes:    Tramadol Hcl 50 Mg Tabs (Tramadol hcl) .[name]... 1-2 tabs by mouth two times a day as needed pain  Problem # 3:  HYPOTHYROIDISM (ICD-244.9) Assessment: Unchanged  Her updated medication list for this problem includes:    Synthroid 25 Mcg Tabs (Levothyroxine sodium) .[name]... 1 by mouth once daily for thyroid  Problem # 4:  ATRIAL FIBRILLATION (ICD-427.31) Assessment: Unchanged  Her updated medication list for this problem includes:    Cardizem Cd 120 Mg Xr24h-cap (Diltiazem hcl coated beads) .[name]... 1 cap once daily    Metoprolol Succinate 50 Mg Xr24h-tab (Metoprolol succinate) .[name]... Take one tablet by mouth two times a day.    Warfarin Sodium 5 Mg Tabs (Warfarin sodium) .[name]... As directed  Problem # 5:  EDEMA (ICD-782.3) Assessment: Improved  Her updated medication list for this problem includes:    Furosemide 40 Mg Tabs (Furosemide) .[name]... 1 by mouth every other day as needed swelling  Problem # 6:  PEPTIC ULCER DISEASE (ICD-533.90) Assessment: Improved  Her updated medication list for this problem includes:    Protonix 40 Mg Tbec (Pantoprazole sodium) .[name]... 1 tab once daily  Problem # 7:  Elevated ESR of ? etiol. Assessment: Improved The labs were reviewed with the patient.  SPEP was nl  Complete Medication List: 1)  Temazepam 30 Mg Caps (Temazepam) .... Take 1 tablet by mouth once a day 2)  Furosemide 40 Mg Tabs (Furosemide) .[name].. 1 by mouth every other day as needed swelling 3)  Vitamin D3 1000 Unit Tabs (Cholecalciferol) .[name].. 1 qd 4)  Vitamin B Complex Caps (B complex vitamins) .... Take 1 tablet by mouth once a day 5)  Synthroid 25 Mcg Tabs (Levothyroxine sodium) .[name].. 1 by mouth once daily for  thyroid 6)  Cardizem Cd 120 Mg Xr24h-cap (Diltiazem hcl coated beads) .[name].. 1 cap once daily 7)  Metoprolol Succinate 50 Mg Xr24h-tab (Metoprolol succinate) .... Take one tablet by mouth two times a day. 8)  Protonix 40 Mg Tbec (Pantoprazole sodium) .[name].. 1 tab once daily 9)  Warfarin Sodium 5 Mg Tabs (Warfarin sodium) .... As directed 10)  Hydroxyzine Hcl 25 Mg Tabs (Hydroxyzine hcl) .[name].. 1-2 tabs by mouth two times a day as needed itching 11)  Ferrex 150 Forte 150-25-1 Mg-mcg-mg Caps (Iron polysacch cmplx-b12-fa) .[name].. 1 by mouth two times a day  take with vit c 250 mg two times a day 12)  Loratadine 10 Mg Tabs (Loratadine) .[name].. 1 by mouth once daily as needed allergies 13)  Wheelchair  .... As dirr 14)  Tramadol Hcl 50 Mg Tabs (Tramadol hcl) .[name].. 1-2 tabs by mouth two times a day  as needed pain  Patient Instructions: 1)  Please schedule a follow-up appointment in 2 months. 2)  BMP prior to visit, ICD-9: 3)  CBC w/ Diff prior to visit, ICD-9: 4)  Iron/TIBC 280.9 5)  ESR Prescriptions: TRAMADOL HCL 50 MG TABS (TRAMADOL HCL) 1-2 tabs by mouth two times a day as needed pain  #120 x 3   Entered and Authorized by:   Tresa Garter MD   Signed by:   Tresa Garter MD on 12/24/2009   Method used:   [facility]        [facility] [address] [phone]* (retail)       [address]       [place], [place]  [address]       Ph: [phone] or [phone]       Fax: [phone]   RxID:   [id number]    Orders Added: 1)  Est. Patient Level IV [956[address]]

## 2010-02-21 NOTE — Letter (Signed)
Summary: Previsit letter  [facility]  [address] [place], [place] [address]   Phone: [phone]  Fax: [phone]       02/13/2010 MRN: [id number]  [name] [address] [place], [place]  [address]  Dear [name],  Welcome to the Gastroenterology Division at [facility].    You are scheduled to see a nurse for your pre-procedure visit on 02/20/10 at 4 pm on the 3rd floor at [facility], [address]  We ask that you try to arrive at our office 15 minutes prior to your appointment time to allow for check-in.  Your nurse visit will consist of discussing your medical and surgical history, your immediate family medical history, and your medications.    Please bring a complete list of all your medications or, if you prefer, bring the medication bottles and we will list them.  We will need to be aware of both prescribed and over the counter drugs.  We will need to know exact dosage information as well.  If you are on blood thinners (Coumadin, Plavix, Aggrenox, Ticlid, etc.) please call our office today/prior to your appointment, as we need to consult with your physician about holding your medication.   Please be prepared to read and sign documents such as consent forms, a financial agreement, and acknowledgement forms.  If necessary, and with your consent, a friend or relative is welcome to sit-in on the nurse visit with you.  Please bring your insurance card so that we may make a copy of it.  If your insurance requires a referral to see a specialist, please bring your referral form from your primary care physician.  No co-pay is required for this nurse visit.     If you cannot keep your appointment, please call [phone] to cancel or reschedule prior to your appointment date.  This allows [place] the opportunity to schedule an appointment for another patient in need of care.    Thank you for choosing [facility] for your medical needs.  We  appreciate the opportunity to care for you.  Please visit [place] at our website  to learn more about our practice.                     Sincerely.                                                                                                                   The Gastroenterology Division

## 2010-02-21 NOTE — Procedures (Signed)
Summary: Colon   Colonoscopy  Procedure date:  12/09/2001  Findings:      Location:  [facility].   Patient Name: [name], [name] MRN:  Procedure Procedures: Colonoscopy CPT: [phone].    with biopsy. CPT: Q5068410.  Personnel: Endoscopist: Venita Lick. Russella Dar, MD, Clementeen Graham.  Exam Location: Exam performed in Endoscopy Suite. Inpatient-ward  Patient Consent: Procedure, Alternatives, Risks and Benefits discussed, consent obtained, from patient.  Indications Symptoms: Hematochezia. Abdominal pain / bloating.  History  Pre-Exam Physical: Performed Dec 09, 2001. Entire physical exam was normal.  Exam Exam: Extent of exam reached: Cecum, extent intended: Cecum.  The cecum was identified by appendiceal orifice and IC valve. Colon retroflexion performed. ASA Classification: II. Tolerance: good.  Monitoring: Pulse and BP monitoring, Oximetry used. Supplemental O2 given.  Colon Prep Used Golytely for colon prep. Prep results: fair, adequate exam.  Sedation Meds: Patient assessed and found to be appropriate for moderate (conscious) sedation. Fentanyl 100 mcg. given IV. Versed 12.5 given IV.  Findings ISCHEMIC COLITIS: Splenic Flexure to Descending Colon. Granularity present, bleeding present, ulcers present, Friability: spontaneous hemorrhage. Activity level severe, Endoscopic Extent of Disease: Segmental Colitis. superficial ulcers present. Biopsy/Mucosal Abn. taken. ICD9: Colitis, Ischemic: 557.9. Comments: extends from 35- 60cm.  NORMAL EXAM: Cecum to Transverse Colon.  - DIVERTICULOSIS: Sigmoid Colon. Not bleeding. ICD9: Diverticulosis: 562.10.  HEMORRHOIDS: Internal. Size: Small. Not bleeding. Not thrombosed. ICD9: Hemorrhoids, Internal: 455.0.   Assessment  Diagnoses: 557.9: Colitis, Ischemic.  562.10: Diverticulosis.  455.0: Hemorrhoids, Internal.   Events  Unplanned Interventions: No intervention was required.  Unplanned Events: There were no  complications. Plans  Post Exam Instructions: No aspirin or non-steroidal containing medications: 2 weeks.  Medication Plan: Await pathology. Continue current medications.  Patient Education: Patient given standard instructions for: Colitis. Diverticulosis.  Disposition: After procedure patient sent to recovery. After recovery patient sent back to hospital.  Scheduling/Referral: Primary Care Provider, to Linda Hedges. [name], MD,  Office Visit, to Vania Rea. Jarold Motto, MD, prn    This report was created from the original endoscopy report, which was reviewed and signed by the above listed endoscopist.

## 2010-02-21 NOTE — Progress Notes (Signed)
Summary: Rf Temazepam  Phone Note Refill Request Message from:  Fax from Pharmacy  Refills Requested: Medication #1:  TEMAZEPAM 30 MG  CAPS Take 1 tablet by mouth once a day   Dosage confirmed as above?Dosage Confirmed   Supply Requested: 30   Last Refilled: 11/06/2009  Method Requested: Telephone to Pharmacy Next Appointment Scheduled: 12-24-09 Initial call taken by: Lanier Prude, [facility]),  December 17, 2009 10:53 AM  Follow-up for Phone Call        ok x6 Follow-up by: Tresa Garter MD,  December 17, 2009 5:51 PM    Prescriptions: TEMAZEPAM 30 MG  CAPS (TEMAZEPAM) Take 1 tablet by mouth once a day  #30 x 5   Entered by:   Lamar Sprinkles, CMA   Authorized by:   Tresa Garter MD   Signed by:   Lamar Sprinkles, CMA on 12/17/2009   Method used:   Telephoned to ...       [facility] [address] [phone]* (retail)       [address]       [place], [place]  [address]       Ph: [phone] or [phone]       Fax: [phone]   RxID:   [id number]

## 2010-02-21 NOTE — Medication Information (Signed)
Summary: rov/jk  Anticoagulant Therapy  Managed by: Weston Brass, PharmD Referring MD: Verne Carrow PCP: Georgina Quint [name] MD Supervising MD: Ladona Ridgel MD,Gregg Indication 1: Atrial Fibrillation Lab Used: [facility] [place] Site: [address] INR POC 3.4 INR RANGE 2.0-3.0  Dietary changes: no    Health status changes: no    Bleeding/hemorrhagic complications: no    Recent/future hospitalizations: no    Any changes in medication regimen? no    Recent/future dental: no  Any missed doses?: no       Is patient compliant with meds? yes       Allergies: 1)  ! Macrodantin (Nitrofurantoin Macrocrystal) 2)  ! Codeine Sulfate (Codeine Sulfate) 3)  ! Atenolol (Atenolol) 4)  Percocet (Oxycodone-Acetaminophen)  Anticoagulation Management History:      The patient is taking warfarin and comes in today for a routine follow up visit.  Positive risk factors for bleeding include an age of 75 years or older and presence of serious comorbidities.  The bleeding index is 'intermediate risk'.  Positive CHADS2 values include History of HTN and Age > 55 years old.  Her last INR was 2.6.  Anticoagulation responsible provider: Ladona Ridgel MD,Gregg.  INR POC: 3.4.  Cuvette Lot#: [id number].  Exp: 10/2010.    Anticoagulation Management Assessment/Plan:      The patient's current anticoagulation dose is Warfarin sodium 5 mg tabs: Use as directed by Anticoagulation Clinic.  The target INR is 2.0-3.0.  The next INR is due 10/11/2009.  Anticoagulation instructions were given to patient.  Results were reviewed/authorized by Weston Brass, PharmD.  She was notified by Eda Keys.         Prior Anticoagulation Instructions: INR 4.0  Skip today, August 19th's dose.  Resume normal schedule of taking 1 tablet (5mg )  every day except take 1/2 tablet (2.5mg ) on Tuesdays and Thursdays.    Current Anticoagulation Instructions: INR 3.4 Hold today. Take 1 tablet on sunday, monday, wednesday, and  friday. Take a half tablet on tuesday, thursday, and saturday. see you in 2 weeks.

## 2010-02-21 NOTE — Medication Information (Signed)
Summary: rov/sp  Anticoagulant Therapy  Managed by: Bethena Midget, RN, BSN Referring MD: Verne Carrow PCP: Tresa Garter MD Supervising MD: Juanda Chance MD, Takashi Korol Indication 1: Atrial Fibrillation Lab Used: [facility] [place] Site: [address] INR POC 3.0 INR RANGE 2.0-3.0  Dietary changes: yes       Details: Haven't eat as many green leafy veggies  Health status changes: no    Bleeding/hemorrhagic complications: no    Recent/future hospitalizations: no    Any changes in medication regimen? no    Recent/future dental: no  Any missed doses?: no       Is patient compliant with meds? yes       Allergies: 1)  ! Macrodantin (Nitrofurantoin Macrocrystal) 2)  ! Codeine Sulfate (Codeine Sulfate) 3)  Atenolol (Atenolol) 4)  Percocet (Oxycodone-Acetaminophen) 5)  Biaxin  Anticoagulation Management History:      The patient is taking warfarin and comes in today for a routine follow up visit.  Positive risk factors for bleeding include an age of 75 years or older, history of GI bleeding, and presence of serious comorbidities.  The bleeding index is 'high risk'.  Positive CHADS2 values include History of HTN and Age > 75 years old.  Her last INR was 1.6 ratio.  Anticoagulation responsible provider: Juanda Chance MD, Smitty Cords.  INR POC: 3.0.  Cuvette Lot#: [id number].  Exp: 01/2011.    Anticoagulation Management Assessment/Plan:      The patient's current anticoagulation dose is Warfarin sodium 5 mg tabs: as directed.  The target INR is 2.0-3.0.  The next INR is due 02/07/2010.  Anticoagulation instructions were given to patient.  Results were reviewed/authorized by Bethena Midget, RN, BSN.  She was notified by Bethena Midget, RN, BSN.         Prior Anticoagulation Instructions: INR 2.3  Spoke with Delice Bison from [employer].  Continue same dose of 1/2 tablet every day except 1 tablet on Monday, Wednesday and Friday.  Recheck INR in 4 weeks.   Current Anticoagulation  Instructions: INR 3.0 On Tomorrow only take 1/2 pill then resume 1/2 pill everyday except 1 pill on Mondays, Wednesdays and Fridays. Recheck in 4 weeks.

## 2010-02-21 NOTE — Miscellaneous (Signed)
Summary: Care Plans/[facility]  Care Plans/[facility]   Imported By: Sherian Rein 01/02/2010 14:10:46  _____________________________________________________________________  External Attachment:    Type:   Image     Comment:   External Document

## 2010-02-21 NOTE — Miscellaneous (Signed)
Summary: temazepam  Clinical Lists Changes  Medications: Rx of TEMAZEPAM 30 MG  CAPS (TEMAZEPAM) Take 1 tablet by mouth once a day;  #30 x 5;  Signed;  Entered by: Tora Perches;  Authorized by: Tresa Garter MD;  Method used: [facility]phoned to [facility] [address] [phone]*, [address] [place], [place], [place]  [address], Ph: [phone] or [phone], Fax: [phone]    Prescriptions: TEMAZEPAM 30 MG  CAPS (TEMAZEPAM) Take 1 tablet by mouth once a day  #30 x 5   Entered by:   Tora Perches   Authorized by:   Tresa Garter MD   Signed by:   Tora Perches on 05/03/2008   Method used:   Telephoned to ...       [facility]  [facility] [address] [phone]* (retail)       [address]       [place], [place]  [address]       Ph: [phone] or [phone]       Fax: [phone]   RxID:   [id number]

## 2010-02-21 NOTE — Miscellaneous (Signed)
Summary: Orders/[facility]  Orders/[facility]   Imported By: Lester  12/04/2009 10:38:20  _____________________________________________________________________  External Attachment:    Type:   Image     Comment:   External Document

## 2010-02-21 NOTE — Progress Notes (Signed)
Summary: ASAP appt  Phone Note From Other Clinic   Caller: Stanton Kidney [phone] Dr Hinda Kehr Call For: Dr Russella Dar Reason for Call: Schedule Patient Appt Summary of Call: Melena x1wk requesting ASAP appt.  I saw a Hospital consult back in 03 no office visits. Initial call taken by: Leanor Kail [facility],  October 25, 2009 3:22 PM  Follow-up for Phone Call        Patient  is scheduled to see Dr Christella Hartigan tomorrow pm at 3:30. Follow-up by: Darcey Nora RN, CGRN,  October 25, 2009 4:14 PM     Appended Document: ASAP appt she needs CBC, INR a couple hours before her appt with me on Friday 7th.  Appended Document: ASAP appt Dr Christella Hartigan she had labs yesterday with Dr Posey Rea, I see a CBC do you still want the INR?  Appended Document: ASAP appt she has been having melena and is on coumadin (INR a couple days ago was 3.1 I think). Would like new labs today cbc, inr to see where she is at. thanks  Appended Document: ASAP appt pt aware labs in IDX

## 2010-02-21 NOTE — Miscellaneous (Signed)
Summary: FOLIC ACID  Clinical Lists Changes  Medications: Rx of FOLIC ACID 1 MG  TABS (FOLIC ACID) 2mg  once daily;  #60 x 6;  Signed;  Entered by: Tora Perches;  Authorized by: Tresa Garter MD;  Method used: Electronic    Prescriptions: FOLIC ACID 1 MG  TABS (FOLIC ACID) 2mg  once daily  #60 x 6   Entered by:   Tora Perches   Authorized by:   Tresa Garter MD   Signed by:   Tora Perches on 12/28/2006   Method used:   Electronically sent to ...       [facility]  [name] [address] [phone]*       [address]       [place], [place]  [address]       Ph: [phone] or [phone]       Fax: [phone]   RxID:   [id number]

## 2010-02-21 NOTE — Miscellaneous (Signed)
Summary: Lab order/[facility]  Lab order/[facility]   Imported By: Sherian Rein 11/26/2009 08:10:43  _____________________________________________________________________  External Attachment:    Type:   Image     Comment:   External Document

## 2010-02-21 NOTE — Assessment & Plan Note (Signed)
History of Present Illness Visit Type: consult  Primary GI MD: Elie Goody MD [facility] Primary Provider: Georgina Quint [name] MD Requesting Provider: Tresa Garter MD Chief Complaint: Melena  History of Present Illness:     very pleasant 77 year old woman who started having melenic stools, two days worth.  Then became constipated for several days, bought some mag citrate...took that a couple days ago.  Had loose loose stools as a results, very dark.  her last BM was yesterday AM, not as dark.  No red blood in any of her stools.  No pepto bismol.  No abd pains, no nausea or vomiting.  She is on coumadin for 6 months, for atrial fibrillation.  two weeks ago she was at coumadin clinic.  She started prednisone dose pack.  She started taking alleve at the same time for about a week. Takes baby ASA every day (stopped yesterday).  she had a CBC checked today and her hemoglobin was 9.1, her INR today was 3.3.             Current Medications (verified): 1)  Temazepam 30 Mg  Caps (Temazepam) .... Take 1 Tablet By Mouth Once A Day 2)  Triamterene-Hctz 75-50 Mg  Tabs (Triamterene-Hctz) .... Take 1 Tablet By Mouth Once A Day 3)  Benazepril Hcl 20 Mg  Tabs (Benazepril Hcl) .[name].. 1-2 By Mouth Daily 4)  Amlodipine Besylate 5 Mg  Tabs (Amlodipine Besylate) .... 1/2 By Mouth Once Daily 5)  Metoprolol Succinate 50 Mg Xr24h-Tab (Metoprolol Succinate) .[name].. 1 Tab Two Times A Day 6)  Furosemide 40 Mg Tabs (Furosemide) .[name].. 1 By Mouth Every Other Day As Needed Swelling 7)  Vitamin D3 1000 Unit  Tabs (Cholecalciferol) .[name].. 1 Qd 8)  Aspirin 81 Mg Tbec (Aspirin) .... Take One Tablet By Mouth Daily 9)  Vitamin B Complex   Caps (B Complex Vitamins) .... Take 1 Tablet By Mouth Once A Day 10)  Warfarin Sodium 5 Mg Tabs (Warfarin Sodium) .... Use As Directed By Anticoagulation Clinic 11)  Calcium Carbonate-Vitamin D 600-400 Mg-Unit  Tabs (Calcium Carbonate-Vitamin D) .... Take 1 Tablet By Mouth Once A  Day 12)  Tramadol Hcl 50 Mg Tabs (Tramadol Hcl) .[name].. 1-2 Tabs By Mouth Two Times A Day As Needed Pain 13)  Synthroid 25 Mcg Tabs (Levothyroxine Sodium) .[name].. 1 By Mouth Once Daily For Thyroid  Allergies (verified): 1)  ! Macrodantin (Nitrofurantoin Macrocrystal) 2)  ! Codeine Sulfate (Codeine Sulfate) 3)  ! Atenolol (Atenolol) 4)  Percocet (Oxycodone-Acetaminophen)  Past History:  Past Medical History: Paroxysmal Atrial Fibrillation on coumadin therapy Hypertension Low back pain, severe lumbar spondylosis s/p L3/L4, L4/L5 fusion 12/10. Osteoarthritis Osteopenia Elevated mcv 790.09 Constipation  564.00Iischemic colitis with rectal bleeding  558.9 Borderline low B12 Renal insufficiency Hypothyroidism 2011    Past Surgical History: Left rotator cuff surgery 1997 Tonsillectomy Back surgery-12/10 L3/L4/L5 fusion    Family History: Family History Hypertension Mother deceased from unknown causes Father deceased from cancer 1 sister, does not know her   Social History: Never Smoked 1 glass of wine per day No illicit drugs Retired [employer] Married, 2 children-ages 16,10.     Review of Systems       Pertinent positive and negative review of systems were noted in the above HPI and GI specific review of systems.  All other review of systems was otherwise negative.   Vital Signs:  Patient profile:   77 year old female Height:  64 inches Weight:      167 pounds BMI:     28.77 BSA:     1.81 Pulse rate:   76 / minute Pulse rhythm:   irregular BP sitting:   118 / 60  (left arm) Cuff size:   regular  Vitals Entered By: Ok Anis CMA (October 26, 2009 2:38 PM)  Physical Exam  Additional Exam:  Constitutional: generally well appearing Psychiatric: alert and oriented times 3 Eyes: extraocular movements intact Mouth: oropharynx moist, no lesions Neck: supple, no lymphadenopathy Cardiovascular: heart regular rate and rythm Lungs: CTA bilaterally Abdomen:  soft, non-tender, non-distended, no obvious ascites, no peritoneal signs, normal bowel sounds Extremities: no lower extremity edema bilaterally Skin: no lesions on visible extremities    Impression & Recommendations:  Problem # 1:  Recent GI bleeding I suspect she has had an acute upper GI bleed, likely from prednisone, NSAID combination. Aspirin could contribute. She is on Coumadin and her INR is super a therapeutic.  I told her to stop her Coumadin for now, she was already explained yesterday to stop aspirin and NSAIDs.  I will give her samples of a proton pump inhibitor and she will take one pill twice daily. We will proceed with an EGD on Monday. She knows over the weekend call if she has any concerning signs or symptoms that I outlined with her very clearly.  Patient Instructions: 1)  Stop coumadin for now. 2)  Start PPI, sampled given.  One pill twice daily (20-30 min before breakfast and lunch meals). 3)  You will be scheduled to have an upper endoscopy (lec) on Monday. 4)  In meantime call GI or go to ER if you have bleeding, light headedness, chest pains, shortness of breath. 5)  A copy of this information will be sent to Dr. Posey Rea, [facility]. 6)  You will get lab test(s) done on Monday AM (cbc, inr) 7)  The medication list was reviewed and reconciled.  All changed / newly prescribed medications were explained.  A complete medication list was provided to the patient / caregiver.  Appended Document: Orders Update/EGD    Clinical Lists Changes  Problems: Added new problem of NONSPECIFIC ABNORMAL FINDING IN STOOL CONTENTS (ICD-792.1) Orders: Added new Test order of EGD (EGD) - Signed

## 2010-02-21 NOTE — Letter (Signed)
Summary: ER Notification  Architectural technologist, Main Office  [address]   [place], [place] [address]   Phone: [phone]  Fax: [phone]    November 08, 2009 5:56 PM  [name]  The above referenced patient has been advised to report directly to the Emergency Room. Please see below for more information:  Dx: Lethargic/possible anemia   Private Vehicle  _________X______ or EMS:  ________________   Orders:  Yes ______ or No  ____X___   Notify upon arrival:  Do not admit under cardiology service.   Thank you,    Forensic psychologist

## 2010-02-21 NOTE — Progress Notes (Signed)
Summary: Temazepam refill  Phone Note Refill Request Message from:  Fax from Pharmacy on May 09, 2009 11:45 AM  Refills Requested: Medication #1:  TEMAZEPAM 30 MG  CAPS Take 1 tablet by mouth once a day Next Appointment Scheduled: 08-01-09 Initial call taken by: Lucious Groves,  May 09, 2009 11:45 AM  Follow-up for Phone Call        ok x 6 Follow-up by: Tresa Garter MD,  May 09, 2009 1:13 PM    Prescriptions: TEMAZEPAM 30 MG  CAPS (TEMAZEPAM) Take 1 tablet by mouth once a day  #30 x 5   Entered by:   Ami Bullins CMA   Authorized by:   Tresa Garter MD   Signed by:   Bill Salinas CMA on 05/09/2009   Method used:   Telephoned to ...       [facility]  [facility] [address] [phone]* (retail)       [address]       [place], [place]  [address]       Ph: [phone] or [phone]       Fax: [phone]   RxID:   [id number]

## 2010-02-21 NOTE — Miscellaneous (Signed)
Summary: Rx  Clinical Lists Changes  Medications: Rx of TRIAMTERENE-HCTZ 75-50 MG  TABS (TRIAMTERENE-HCTZ) Take 1 tablet by mouth once a day;  #30 x 12;  Signed;  Entered by: Tresa Garter MD;  Authorized by: Tresa Garter MD;  Method used: Electronically to [facility]  [facility] [address] [phone]*, [address] [place], [place], [place]  [address], Ph: [phone] or [phone], Fax: [phone] Rx of CELEBREX 200 MG  CAPS (CELECOXIB) once daily;  #60 x 12;  Signed;  Entered by: Tresa Garter MD;  Authorized by: Tresa Garter MD;  Method used: Electronically to [facility]  [facility] [address] [phone]*, [address] [place], [place], [place]  [address], Ph: [phone] or [phone], Fax: [phone]    Prescriptions: CELEBREX 200 MG  CAPS (CELECOXIB) once daily  #60 x 12   Entered and Authorized by:   Tresa Garter MD   Signed by:   Tresa Garter MD on 11/22/2007   Method used:   Electronically to        CVS  [facility] [address] [phone]* (retail)       [address]       [place], [place]  [address]       Ph: [phone] or [phone]       Fax: [phone]   RxID:   [id number] TRIAMTERENE-HCTZ 75-50 MG  TABS (TRIAMTERENE-HCTZ) Take 1 tablet by mouth once a day  #30 x 12   Entered and Authorized by:   Tresa Garter MD   Signed by:   Tresa Garter MD on 11/22/2007   Method used:   Electronically to        [facility]S  [facility] [address] [phone]* (retail)       [address]       [place], [place]  [address]       Ph: [phone] or [phone]       Fax: [phone]   RxID:   [id number]

## 2010-02-21 NOTE — Miscellaneous (Signed)
Summary: D/C/[facility]  D/C/[facility]   Imported By: Lester Hawley 12/25/2009 10:32:21  _____________________________________________________________________  External Attachment:    Type:   Image     Comment:   External Document

## 2010-02-21 NOTE — Medication Information (Signed)
Summary: rov/ewj  Anticoagulant Therapy  Managed by: Weston Brass, PharmD Referring MD: Verne Carrow PCP: Georgina Quint [name] MD Supervising MD: Clifton James MD, Cristal Deer Indication 1: Atrial Fibrillation INR POC 2.5 INR RANGE 2.0-3.0  Dietary changes: no    Health status changes: no    Bleeding/hemorrhagic complications: no    Recent/future hospitalizations: yes       Details: having surgery on back (L4 and 5) 12/14.   Any changes in medication regimen? no    Recent/future dental: no  Any missed doses?: no       Is patient compliant with meds? yes       Allergies: 1)  ! Macrodantin (Nitrofurantoin Macrocrystal) 2)  ! Codeine Sulfate (Codeine Sulfate) 3)  ! Atenolol (Atenolol) 4)  Percocet (Oxycodone-Acetaminophen)  Anticoagulation Management History:      The patient is taking warfarin and comes in today for a routine follow up visit.  Positive risk factors for bleeding include an age of 75 years or older.  The bleeding index is 'intermediate risk'.  Positive CHADS2 values include History of HTN and Age > 27 years old.  Anticoagulation responsible provider: Clifton James MD, Cristal Deer.  INR POC: 2.5.  Cuvette Lot#: [id number].  Exp: 10/2009.    Anticoagulation Management Assessment/Plan:      The patient's current anticoagulation dose is Warfarin sodium 5 mg tabs: take 1 tablet daily.  The next INR is due 11/23/2008.  Anticoagulation instructions were given to patient.  Results were reviewed/authorized by Weston Brass, PharmD.  She was notified by Weston Brass PharmD.         Prior Anticoagulation Instructions: INR 3.5  Skip todays dose of coumadin, then start 1/2 tablet on Mondays, Wednesdays, and Fridays.  Recheck in 1 week.    Current Anticoagulation Instructions: INR 2.5  Continue to take 1 tablet every day except 1/2 tablet on Monday, Wednesday and Friday
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
## 2010-02-21 NOTE — Progress Notes (Signed)
Summary: Labs/Med change  Phone Note Call from Patient Call back at Home Phone [phone]   Summary of Call: Patient called to find out if MD has any recommendations for her. Please advise. Initial call taken by: Lucious Groves,  March 14, 2009 9:02 AM  Follow-up for Phone Call        The test was neg. Recomendations as we discussed: Amlodipine 1/2 once daily. Recheck BMET in 10 d 401.1. Drink more water. She has stopped Diltiazem a while ago, correct? Follow-up by: Tresa Garter MD,  March 14, 2009 1:01 PM  Additional Follow-up for Phone Call Additional follow up Details #1::        Patient notified and had last Diltiazem today. She is coming in next week and will discuss labs with MD. Patient declined to schedule a lab visit. Additional Follow-up by: Lucious Groves,  March 14, 2009 1:12 PM

## 2010-02-21 NOTE — Progress Notes (Signed)
Summary: COUMADIN   Phone Note Call from Patient   Summary of Call: Pt's daugther is in town helping care for patient. She has been off coumadin since GI endoscopy on 10/10. GI advised pt can resume medication 5 days after procedure. Daughter stated that patient was told by cardiology that she needs to wait to resume coumadin after f/u with Dr Posey Rea. Pt is not scheduled for f/u until 12/03/09. When does patient need f/u with coumadin clinic?  Initial call taken by: Lamar Sprinkles, CMA,  November 07, 2009 4:48 PM  Follow-up for Phone Call        Per Dr Christella Hartigan note in EMR pt is OK to resume Coumadin and ASA in 5 days after EGD on 10/29/09.  No evidence or note from Cardiology that she should see [name] 1st before restart.  Dr Christella Hartigan stopped Coumadin secondary to GI bleeding, so once he cleared for her to resume pt should  start back on Coumadin.  OK to resume Coumadin will need recheck in 1 week.  Attempted to contact pt's daughter.  LMOM TCB.   Spoke with pt who stated that she decreased her Benazepril to 1/2 tablet starting today. She has been taking two tab. daily. Her BP this am was 88/59 HR 102. Advised her to keep checking her BP & if it remains low after making this change then to call us back. Will ask Dr. Clifton James if he wants her to continue just taking 50mg  by mouth Metorprolol daily versus taking it two times a day. She is also C/O of being able to walk after having back surgery. I advised her to f/u  with her orthopedic for this issue. She has been instruced to resume taking coumadin by her PCP which they are reluctant to do after her GI bleed. I instructed them to contact her PCP so they could recheck her CBC before restarting coumadin. I will f/u with them after speaking to Dr. Clifton James. Whitney Maeola Sarah RN  November 08, 2009 12:52 PM  BP 88/52. Retired [employer] fund[employer] is stating that pt. is very pale & weak. Pt's daughter is wondering if her mother can come in to the office to get her hgb checked.  Pt came in for a CBC. Dr.McAlhany saw pt &  has advised her to go to the ER since she is feeling soo weak & pale. They can run more blood test & do a further eval. on her. I will f/u with pt. Whitney Maeola Sarah RN  November 08, 2009 5:44 PM    Follow-up by: Cloyde Reams RN,  November 08, 2009 8:17 AM  Additional Follow-up for Phone Call Additional follow up Details #1::        Ok to resume Coumadin 5 mg a day. Coum clinic on Mon   Additional Follow-up by: Tresa Garter MD,  November 08, 2009 9:23 AM    Additional Follow-up for Phone Call Additional follow up Details #2::    Called spoke with pt's daughter Marvis Repress. Advised per Dr Posey Rea ok for pt to resume Coumadin at previous dosage and appt made for CVRR on Monday 11/12/09 per Dr Posey Rea. Pt's daughter states pt is very weak, and her BP is running consistantly low.  Unable to walk 19ft without giving out.  BP yesterday was 88/52.  Pt has been taking Benazepril 20mg  2 tablets daily until 11/07/09 when [name]'s office clarified she was supposed to decr dosage to 1/2 tablet daily.  Daughter states pt has only been taking  Metoprolol 50mg  once daily in am, has not been taking two times a day as instructed on medication list.  Please advise what BP meds pt should be presently taking. Thanks Follow-up by: Cloyde Reams RN,  November 08, 2009 12:01 PM  New/Updated Medications: METOPROLOL SUCCINATE 50 MG XR24H-TAB (METOPROLOL SUCCINATE) 1 tab by mouth daily.

## 2010-02-21 NOTE — Assessment & Plan Note (Signed)
Summary: 3 MO ROV / $50 /NWS   Vital Signs:  Patient profile:   77 year old female Weight:      171 pounds Temp:     97.5 degrees F oral Pulse rate:   84 / minute BP sitting:   132 / 78  (left arm)  Vitals Entered By: Tora Perches (September 04, 2008 1:36 PM) CC: f/u Is Patient Diabetic? No   Primary Care Provider:  Tresa Garter MD  CC:  f/u.  History of Present Illness: The patient presents for a follow up of back pain - worse; bad w/standing, HTN. Had seen Dr Cathlean Cower, had an MRI: spinal stenosis.   Current Medications (verified): 1)  Celebrex 200 Mg  Caps (Celecoxib) .... Once Daily 2)  Temazepam 30 Mg  Caps (Temazepam) .... Take 1 Tablet By Mouth Once A Day 3)  Triamterene-Hctz 75-50 Mg  Tabs (Triamterene-Hctz) .... Take 1 Tablet By Mouth Once A Day 4)  Vitamin B Complex   Caps (B Complex Vitamins) .... Take 1 Tablet By Mouth Once A Day 5)  Vitamin D3 1000 Unit  Tabs (Cholecalciferol) .[name].. 1 Qd 6)  Benazepril Hcl 20 Mg  Tabs (Benazepril Hcl) .... 2 By Mouth Daily 7)  Amlodipine Besylate 5 Mg  Tabs (Amlodipine Besylate) .[name].. 1 By Mouth Qd 8)  Aspirin 325 Mg Tabs (Aspirin) .... Take 1 Tab By Mouth Every Day 9)  Toprol Xl 25 Mg Xr24h-Tab (Metoprolol Succinate) .... Take  Half Tablet By Mouth Twice  A Day  Allergies: 1)  ! Macrodantin (Nitrofurantoin Macrocrystal) 2)  ! Codeine Sulfate (Codeine Sulfate) 3)  ! Atenolol (Atenolol) 4)  Percocet (Oxycodone-Acetaminophen)  Past History:  Past Medical History: Last updated: 08/17/2008 Paroxysmal Atrial Fibrillation Hypertension Low back pain, spinal stenosis Osteoarthritis Osteopenia Elevated mcv 790.09 Constipation  564.00Iischemic colitis with rectal bleeding  558.9 Borderline low B12  Past Surgical History: Last updated: 07/18/2008 Left rotator cuff surgery 1997 Tonsillectomy  Family History: Last updated: 07/18/2008 Family History Hypertension Mother deceased from unknown causes Father deceased from  cancer 1 sister, does not know her  Social History: Last updated: 07/18/2008 Never Smoked 1 glass of wine per day No illicit drugs Retired [employer] Married, 2 children-ages [phone].   Physical Exam  General:  General: Well developed, well nourished, NAD HEENT: OP clear, mucus membranes moist SKIN: warm, dry Neuro: No focal deficits Musculoskeletal: Muscle strength 5/5 all ext Psychiatric: Mood and affect normal Neck: No JVD, no carotid bruits, no thyromegaly, no lymphadenopathy. Lungs:Clear bilaterally, no wheezes, rhonci, crackles CV: RRR no murmurs, gallops rubs Abdomen: soft, NT, ND, BS present Extremities: No edema, pulses 2+.  Mouth:  Oral mucosa and oropharynx without lesions or exudates.  Teeth in good repair. Lungs:  Normal respiratory effort, chest expands symmetrically. Lungs are clear to auscultation, no crackles or wheezes. Heart:  Irreg irreg Abdomen:  Bowel sounds positive,abdomen soft and non-tender without masses, organomegaly or hernias noted. Msk:  Lumbar-sacral spine is tender to palpation over paraspinal muscles and painfull with the ROM  Extremities:  No clubbing, cyanosis, edema, or deformity noted with normal full range of motion of all joints.   Neurologic:  No cranial nerve deficits noted. Station and gait are normal. Plantar reflexes are down-going bilaterally. DTRs are symmetrical throughout. Sensory, motor and coordinative functions appear intact. Skin:  Intact without suspicious lesions or rashes Psych:  Cognition and judgment appear intact. Alert and cooperative with normal attention span and concentration. No apparent delusions, illusions, hallucinations  Impression & Recommendations:  Problem # 1:  ATRIAL FIBRILLATION (ICD-427.31) Assessment Unchanged  Her updated medication list for this problem includes:    Amlodipine Besylate 5 Mg Tabs (Amlodipine besylate) .[name]... 1 by mouth qd    Aspirin 325 Mg Tabs (Aspirin) .[name]... Take 1 tab by  mouth every day    Toprol Xl 25 Mg Xr24h-tab (Metoprolol succinate) .[name]... Take  half tablet by mouth twice  a day  Problem # 2:  LOW BACK PAIN (ICD-724.2) Assessment: Deteriorated  Her updated medication list for this problem includes:    Celebrex 200 Mg Caps (Celecoxib) ..... Once daily    Aspirin 325 Mg Tabs (Aspirin) .[name]... Take 1 tab by mouth every day    Tramadol Hcl 50 Mg Tabs (Tramadol hcl) .[name]... 1- by mouth two times a day prn  Orders: Neurosurgeon Referral (Neurosurgeon)  Problem # 3:  SPINAL STENOSIS (ICD-724.00) Assessment: Deteriorated  Dr Channing Mutters consult  Orders: Neurosurgeon Referral (Neurosurgeon)  Problem # 4:  FATIGUE (ICD-780.79)  Complete Medication List: 1)  Celebrex 200 Mg Caps (Celecoxib) .... Once daily 2)  Temazepam 30 Mg Caps (Temazepam) .... Take 1 tablet by mouth once a day 3)  Triamterene-hctz 75-50 Mg Tabs (Triamterene-hctz) .... Take 1 tablet by mouth once a day 4)  Vitamin B Complex Caps (B complex vitamins) .... Take 1 tablet by mouth once a day 5)  Vitamin D3 1000 Unit Tabs (Cholecalciferol) .[name].. 1 qd 6)  Benazepril Hcl 20 Mg Tabs (Benazepril hcl) .... 2 by mouth daily 7)  Amlodipine Besylate 5 Mg Tabs (Amlodipine besylate) .[name].. 1 by mouth qd 8)  Aspirin 325 Mg Tabs (Aspirin) .... Take 1 tab by mouth every day 9)  Toprol Xl 25 Mg Xr24h-tab (Metoprolol succinate) .... Take  half tablet by mouth twice  a day 10)  Tramadol Hcl 50 Mg Tabs (Tramadol hcl) .[name].. 1- by mouth two times a day prn  Patient Instructions: 1)  Please schedule a follow-up appointment in 1 month. 2)  Try to eat more raw plant food, fresh and dry fruit, raw almonds, leafy vegetables, whole foods and less red meat, less animal fat. Poultry and fish is better for you than pork and beef. Avoid processed foods (canned soups, hot dogs, sausage, bacon , frozen dinners). Avoid corn syrup, high fructose syrup or aspartam and Splenda  containing drinks. Honey, Agave and Stevia are better  sweeteners. Make your own  dressing with olive oil, wine vinegar, lemon juce, garlic etc. for your salads. 3)  Start taking a chair yoga class  Prescriptions: TRAMADOL HCL 50 MG  TABS (TRAMADOL HCL) 1- by mouth two times a day prn  #120 x 2   Entered and Authorized by:   Tresa Garter MD   Signed by:   Tresa Garter MD on 09/04/2008   Method used:   [facility]        [name] [address] [phone]* (retail)       [address]       [place], [place]  [address]       Ph: [phone] or [phone]       Fax: [phone]   RxID:   [id number]

## 2010-02-21 NOTE — Assessment & Plan Note (Signed)
Summary: follow up-lb   Vital Signs:  Patient profile:   77 year old female Height:      64 inches Weight:      170 pounds BMI:     29.29 Temp:     98.5 degrees F oral Pulse rate:   92 / minute Pulse rhythm:   regular Resp:     16 per minute BP sitting:   96 / 62  (left arm) Cuff size:   regular  Vitals Entered By: Lanier Prude, Beverly Gust) (October 31, 2009 2:36 PM) CC: f/u Is Patient Diabetic? No   Primary Care Provider:  Tresa Garter MD  CC:  f/u.  History of Present Illness: The patient presents for a follow up of PUD, weakness, GI bleeding. She he has been off ASA, Coumadin.  Current Medications (verified): 1)  Temazepam 30 Mg  Caps (Temazepam) .... Take 1 Tablet By Mouth Once A Day 2)  Triamterene-Hctz 75-50 Mg  Tabs (Triamterene-Hctz) .... Take 1 Tablet By Mouth Once A Day 3)  Benazepril Hcl 20 Mg  Tabs (Benazepril Hcl) .[name].. 1-2 By Mouth Daily 4)  Amlodipine Besylate 5 Mg  Tabs (Amlodipine Besylate) .... 1/2 By Mouth Once Daily 5)  Metoprolol Succinate 50 Mg Xr24h-Tab (Metoprolol Succinate) .[name].. 1 Tab Two Times A Day 6)  Furosemide 40 Mg Tabs (Furosemide) .[name].. 1 By Mouth Every Other Day As Needed Swelling 7)  Vitamin D3 1000 Unit  Tabs (Cholecalciferol) .[name].. 1 Qd 8)  Aspirin 81 Mg Tbec (Aspirin) .... Take One Tablet By Mouth Daily 9)  Vitamin B Complex   Caps (B Complex Vitamins) .... Take 1 Tablet By Mouth Once A Day 10)  Warfarin Sodium 5 Mg Tabs (Warfarin Sodium) .... Use As Directed By Anticoagulation Clinic 11)  Calcium Carbonate-Vitamin D 600-400 Mg-Unit  Tabs (Calcium Carbonate-Vitamin D) .... Take 1 Tablet By Mouth Once A Day 12)  Tramadol Hcl 50 Mg Tabs (Tramadol Hcl) .[name].. 1-2 Tabs By Mouth Two Times A Day As Needed Pain 13)  Synthroid 25 Mcg Tabs (Levothyroxine Sodium) .[name].. 1 By Mouth Once Daily For Thyroid  Allergies (verified): 1)  ! Macrodantin (Nitrofurantoin Macrocrystal) 2)  ! Codeine Sulfate (Codeine Sulfate) 3)  ! Atenolol (Atenolol) 4)   Percocet (Oxycodone-Acetaminophen)  Past History:  Social History: Last updated: 10/26/2009 Never Smoked 1 glass of wine per day No illicit drugs Retired [employer] Married, 2 children-ages [phone].     Past Medical History: Paroxysmal Atrial Fibrillation on coumadin therapy Hypertension Low back pain, severe lumbar spondylosis s/p L3/L4, L4/L5 fusion 12/10. Osteoarthritis Dr Juliene Pina Osteopenia Elevated mcv 790.09 Constipation  564.00Iischemic colitis with rectal bleeding  558.9 Borderline low B12 Renal insufficiency Hypothyroidism 2011  NSAID gastropathy w/bleed 2011  Anemia-iron deficiency 2011  Review of Systems       The patient complains of dyspnea on exertion, muscle weakness, and difficulty walking.  The patient denies fever, abdominal pain, and melena.         LBP  Physical Exam  General:   ill-appearing , pale Nose:  External nasal examination shows no deformity or inflammation. Nasal mucosa are pink and moist without lesions or exudates. Mouth:  Not dry Lungs:  CTA Heart:  Irreg irreg Abdomen:  Bowel sounds positive,abdomen soft and non-tender without masses, organomegaly or hernias noted. Msk:  Using a cane Lumbar-sacral spine is tender to palpation over paraspinal muscles and painfull with the ROM  Extremities:  no edema Neurologic:  alert & oriented X3.   Skin:  no cellulitis Psych:  Oriented X3.     Impression & Recommendations:  Problem # 1:  ANEMIA, IRON DEFICIENCY (ICD-280.9) Assessment New  Her updated medication list for this problem includes:    Ferro-bob 325 (65 Fe) Mg Tabs (Ferrous sulfate) .[name]... 1 by mouth bid for anemia. take with 500 mg of vit c  Problem # 2:  HYPOTHYROIDISM (ICD-244.9) Assessment: Comment Only  Her updated medication list for this problem includes:    Synthroid 25 Mcg Tabs (Levothyroxine sodium) .[name]... 1 by mouth once daily for thyroid  Problem # 3:  MELENA (ICD-578.1) resolved Assessment:  Improved  Problem # 4:  HIP PAIN (ICD-719.45) R poss MSK vs other Assessment: New  Her updated medication list for this problem includes:    Aspirin 81 Mg Tbec (Aspirin) .[name]... Take one tablet by mouth daily    Tramadol Hcl 50 Mg Tabs (Tramadol hcl) .[name]... 1-2 tabs by mouth two times a day as needed pain  Orders: T-Hip Comp Right Min 2 views (73510TC)  Problem # 5:  ATRIAL FIBRILLATION (ICD-427.31) Assessment: Unchanged  The following medications were removed from the medication list:    Warfarin Sodium 5 Mg Tabs (Warfarin sodium) ..... Use as directed by anticoagulation clinic Her updated medication list for this problem includes:    Amlodipine Besylate 5 Mg Tabs (Amlodipine besylate) .[name]... 1/2 by mouth once daily    Metoprolol Succinate 50 Mg Xr24h-tab (Metoprolol succinate) .[name]... 1 tab two times a day    Aspirin 81 Mg Tbec (Aspirin) .[name]... Take one tablet by mouth daily  Problem # 6:  LOW BACK PAIN (ICD-724.2) Assessment: Unchanged  Her updated medication list for this problem includes:    Aspirin 81 Mg Tbec (Aspirin) .[name]... Take one tablet by mouth daily    Tramadol Hcl 50 Mg Tabs (Tramadol hcl) .[name]... 1-2 tabs by mouth two times a day as needed pain  Complete Medication List: 1)  Temazepam 30 Mg Caps (Temazepam) .... Take 1 tablet by mouth once a day 2)  Triamterene-hctz 75-50 Mg Tabs (Triamterene-hctz) .... Take 1 tablet by mouth once a day 3)  Benazepril Hcl 20 Mg Tabs (Benazepril hcl) .[name].. 1-2 by mouth daily 4)  Amlodipine Besylate 5 Mg Tabs (Amlodipine besylate) .... 1/2 by mouth once daily 5)  Metoprolol Succinate 50 Mg Xr24h-tab (Metoprolol succinate) .[name].. 1 tab two times a day 6)  Furosemide 40 Mg Tabs (Furosemide) .[name].. 1 by mouth every other day as needed swelling 7)  Vitamin D3 1000 Unit Tabs (Cholecalciferol) .[name].. 1 qd 8)  Aspirin 81 Mg Tbec (Aspirin) .... Take one tablet by mouth daily 9)  Vitamin B Complex Caps (B complex vitamins) .... Take 1 tablet by mouth once a  day 10)  Calcium Carbonate-vitamin D 600-400 Mg-unit Tabs (Calcium carbonate-vitamin d) .... Take 1 tablet by mouth once a day 11)  Tramadol Hcl 50 Mg Tabs (Tramadol hcl) .[name].. 1-2 tabs by mouth two times a day as needed pain 12)  Synthroid 25 Mcg Tabs (Levothyroxine sodium) .[name].. 1 by mouth once daily for thyroid 13)  Ferro-bob 325 (65 Fe) Mg Tabs (Ferrous sulfate) .[name].. 1 by mouth bid for anemia. take with 500 mg of vit c 14)  Wheelchair  .... As dirr 15)  Prevpac Misc (Amoxicill-clarithro-lansopraz) .... As directed  Patient Instructions: 1)  Take Benazepril  1/2 a day or stop if BP is low 2)  Please schedule a follow-up appointment in 1 month. 3)  BMP prior to visit, ICD-9: 4)  TSH  prior to visit, ICD-9:244.80 5)  CBC w/ Diff prior to visit, ICD-9: 6)  iron/TIBC 280.9 Prescriptions: WHEELCHAIR as dirr  #1 x 0   Entered and Authorized by:   Tresa Garter MD   Signed by:   Tresa Garter MD on 10/31/2009   Method used:   Print then Give to Patient   RxID:   [id number] FERRO-BOB 325 (65 FE) MG TABS (FERROUS SULFATE) 1 by mouth bid for anemia. Take with 500 mg of Vit C  #30 x 6   Entered and Authorized by:   Tresa Garter MD   Signed by:   Tresa Garter MD on 10/31/2009   Method used:   Print then Give to Patient   RxID:   [id number]

## 2010-02-21 NOTE — Miscellaneous (Signed)
Summary: Order/[facility]  Order/[facility]   Imported By: Sherian Rein 01/07/2010 08:44:36  _____________________________________________________________________  External Attachment:    Type:   Image     Comment:   External Document

## 2010-02-21 NOTE — Medication Information (Signed)
Summary: coumadin started 5mg  10-18-08/per mcalhany  Anticoagulant Therapy  Managed by: Bethanne Ginger, PharmD Referring MD: Verne Carrow PCP: Georgina Quint [name] MD Supervising MD: Tenny Craw MD, Gunnar Fusi Indication 1: Atrial Fibrillation INR POC 2.1  Dietary changes: no    Health status changes: no    Bleeding/hemorrhagic complications: no    Recent/future hospitalizations: no    Any changes in medication regimen? no    Recent/future dental: no  Any missed doses?: no       Is patient compliant with meds? yes       Current Medications (verified): 1)  Celebrex 200 Mg  Caps (Celecoxib) .... Once Daily 2)  Temazepam 30 Mg  Caps (Temazepam) .... Take 1 Tablet By Mouth Once A Day 3)  Triamterene-Hctz 75-50 Mg  Tabs (Triamterene-Hctz) .... Take 1 Tablet By Mouth Once A Day 4)  Vitamin B Complex   Caps (B Complex Vitamins) .... Take 1 Tablet By Mouth Once A Day 5)  Vitamin D3 1000 Unit  Tabs (Cholecalciferol) .[name].. 1 Qd 6)  Benazepril Hcl 20 Mg  Tabs (Benazepril Hcl) .... 2 By Mouth Daily 7)  Amlodipine Besylate 5 Mg  Tabs (Amlodipine Besylate) .[name].. 1 By Mouth Qd 8)  Aspirin 81 Mg Tbec (Aspirin) .... Take One Tablet By Mouth Daily 9)  Toprol Xl 25 Mg Xr24h-Tab (Metoprolol Succinate) .... Take 1 Tablet Two Times A Day 10)  Tramadol Hcl 50 Mg  Tabs (Tramadol Hcl) .... As Needed 11)  Warfarin Sodium 5 Mg Tabs (Warfarin Sodium) .... Take 1 Tablet Daily  Allergies (verified): 1)  ! Macrodantin (Nitrofurantoin Macrocrystal) 2)  ! Codeine Sulfate (Codeine Sulfate) 3)  ! Atenolol (Atenolol) 4)  Percocet (Oxycodone-Acetaminophen)  Anticoagulation Management History:      The patient comes in today for her initial visit for anticoagulation therapy.  Positive risk factors for bleeding include an age of 74 years or older.  The bleeding index is 'intermediate risk'.  Positive CHADS2 values include History of HTN and Age > 10 years old.  Anticoagulation responsible provider: Tenny Craw MD, Gunnar Fusi.   INR POC: 2.1.  Cuvette Lot#: [id number].  Exp: 11/20/2009.    Anticoagulation Management Assessment/Plan:      The patient's current anticoagulation dose is Warfarin sodium 5 mg tabs: take 1 tablet daily.  The next INR is due 10/30/2008.  Results were reviewed/authorized by Bethanne Ginger, PharmD.  She was notified by Bethanne Ginger.         Current Anticoagulation Instructions: The patient is to continue with the same dose of coumadin.  This dosage includes: Take 1 tablet (5mg ) every day in the evening.

## 2010-02-21 NOTE — Progress Notes (Signed)
Summary: EGD  Phone Note Outgoing Call Call back at [facility] Phone [phone]   Call placed by: Chales Abrahams CMA [facility] Dull),  February 13, 2010 8:40 AM Summary of Call: pt needs repeat EGD, she has been scheduled for previsit and EGD letter mailed with previsit info.  Pt aware to remain on coumadin. Initial call taken by: Chales Abrahams CMA [facility] Dull),  February 13, 2010 8:54 AM

## 2010-02-21 NOTE — Progress Notes (Signed)
Summary: Rf Temazepam  Phone Note Refill Request Message from:  Fax from Pharmacy  Refills Requested: Medication #1:  TEMAZEPAM 30 MG  CAPS Take 1 tablet by mouth once a day   Dosage confirmed as above?Dosage Confirmed   Supply Requested: 30   Last Refilled: 10/08/2009  Method Requested: Telephone to Pharmacy Next Appointment Scheduled: 11-16-09 Initial call taken by: Lanier Prude, [facility]),  November 06, 2009 3:12 PM  Follow-up for Phone Call        ok to give #30, no refills until further review by PCP - thanks Follow-up by: Newt Lukes MD,  November 06, 2009 4:28 PM    Prescriptions: TEMAZEPAM 30 MG  CAPS (TEMAZEPAM) Take 1 tablet by mouth once a day  #30 x 0   Entered by:   Lanier Prude, [facility])   Authorized by:   Tresa Garter MD   Signed by:   Lanier Prude, CMA(AAMA) on 11/06/2009   Method used:   Telephoned to ...       [facility] [address] [phone]* (retail)       [address]       [place], [place]  [address]       Ph: [phone] or [phone]       Fax: [phone]   RxID:   [id number]

## 2010-02-21 NOTE — Miscellaneous (Signed)
Summary: temazepam  Clinical Lists Changes  Medications: Rx of TEMAZEPAM 30 MG  CAPS (TEMAZEPAM) Take 1 tablet by mouth once a day;  #30 x 6;  Signed;  Entered by: Tora Perches;  Authorized by: Tresa Garter MD;  Method used: [facility] [address] [phone]*, [address] [place], [place], [place]  [address], Ph: [phone] or [phone], Fax: [phone]    Prescriptions: TEMAZEPAM 30 MG  CAPS (TEMAZEPAM) Take 1 tablet by mouth once a day  #30 x 6   Entered by:   Tora Perches   Authorized by:   Tresa Garter MD   Signed by:   Tora Perches on 05/10/2007   Method used:   Telephoned to ...       [facility] [address] [phone]*       [address]       [place], [place]  [address]       Ph: [phone] or [phone]       Fax: [phone]   RxID:   [id number]

## 2010-02-21 NOTE — Medication Information (Signed)
Summary: Coumadin Clinic  Anticoagulant Therapy  Managed by: Bethena Midget, RN, BSN Referring MD: Verne Carrow PCP: Tresa Garter MD Supervising MD: Johney Frame MD, Fayrene Fearing Indication 1: Atrial Fibrillation Lab Used: [facility] Site: [address] PT 33.3 INR POC 3.3 INR RANGE 2.0-3.0  Dietary changes: no    Health status changes: no    Bleeding/hemorrhagic complications: no    Recent/future hospitalizations: yes       Details: Discharged on 01/10/09 from hospital.   Any changes in medication regimen? yes       Details: See dischage med list  Recent/future dental: no  Any missed doses?: no       Is patient compliant with meds? yes       Allergies: 1)  ! Macrodantin (Nitrofurantoin Macrocrystal) 2)  ! Codeine Sulfate (Codeine Sulfate) 3)  ! Atenolol (Atenolol) 4)  Percocet (Oxycodone-Acetaminophen)  Anticoagulation Management History:      Her anticoagulation is being managed by telephone today.  Positive risk factors for bleeding include an age of 75 years or older.  The bleeding index is 'intermediate risk'.  Positive CHADS2 values include History of HTN and Age > 75 years old.  Her last INR was 2.6.  Prothrombin time is 33.3.  Anticoagulation responsible provider: Ella Guillotte MD, Fayrene Fearing.  INR POC: 3.3.    Anticoagulation Management Assessment/Plan:      The patient's current anticoagulation dose is Warfarin sodium 5 mg tabs: take 1 tablet daily.  The target INR is 2.0-3.0.  The next INR is due 01/18/2009.  Anticoagulation instructions were given to patient.  Results were reviewed/authorized by Bethena Midget, RN, BSN.  She was notified by Bethena Midget, RN, BSN.         Prior Anticoagulation Instructions: INR 2.6  CONTINUE TAKING 0.5 TABLETS EVERYDAY EXCEPT TAKE 1 TABLET ON MONDAYS, WEDNESDAYS, AND FRIDAYS.  SHOULD HOLD COUMADIN FOR 5-7 DAYS BEFORE SURGERY.  CALL AFTER SURGERY TO LET us KNOW YOUR PLANS - WHETHER TO GO HOME OR TO REHAB.  Current Anticoagulation  Instructions: INR 3.3 Today take 2.5mg s then resume 2.5mg s daily except 5mg s MWF. Recheck INR on 01/18/09. Orders sent to [facility].

## 2010-02-21 NOTE — Medication Information (Signed)
Summary: Coumadin Clinic   Managed by: Weston Brass, PharmD Referring MD: Verne Carrow PCP: Tresa Garter MD Supervising MD: Eden Emms MD, Theron Arista Indication 1: Atrial Fibrillation Lab Used: [facility] Site: [address] PT 25.4 INR POC 2.37 INR RANGE 2.0-3.0           Allergies: 1)  ! Macrodantin (Nitrofurantoin Macrocrystal) 2)  ! Codeine Sulfate (Codeine Sulfate) 3)  Atenolol (Atenolol) 4)  Percocet (Oxycodone-Acetaminophen) 5)  Biaxin  Anticoagulation Management History:      The patient is taking warfarin and comes in today for a routine follow up visit.  Positive risk factors for bleeding include an age of 38 years or older, history of GI bleeding, and presence of serious comorbidities.  The bleeding index is 'high risk'.  Positive CHADS2 values include History of HTN and Age > 45 years old.  Her last INR was 1.6 ratio.  Prothrombin time is 25.4.  Anticoagulation responsible provider: Eden Emms MD, Theron Arista.  INR POC: 2.37.    Anticoagulation Management Assessment/Plan:      The patient's current anticoagulation dose is Warfarin sodium 5 mg tabs: as directed.  The target INR is 2.0-3.0.  The next INR is due 11/28/2009.  Anticoagulation instructions were given to Daughter.  Results were reviewed/authorized by Weston Brass, PharmD.         Prior Anticoagulation Instructions: INR 1.7  Spoke with pt.  Take 1 1/2 tablets today then resume same dose of 1/2 tablet every day except 1 tablet on Monday, Wednesday and Friday.  Recheck INR in 1 week.  Gave orders to [facility] with [facility].   Current Anticoagulation Instructions: INR 2.37 LMOM to call for dosing. Bethena Midget, RN, BSN  November 28, 2009 3:41 PM  [facility] for pt to continue same dose and call for dosing. Bethena Midget, RN, BSN  November 28, 2009 4:30 PM   Appended Document: Coumadin Clinic    Anticoagulant Therapy  Managed by: Weston Brass, PharmD Referring MD: Verne Carrow PCP:  Tresa Garter MD Supervising MD: Eden Emms MD, Theron Arista Indication 1: Atrial Fibrillation Lab Used: [facility] Site: [address] INR POC 2.37 INR RANGE 2.0-3.0  Dietary changes: no    Health status changes: no    Bleeding/hemorrhagic complications: no    Recent/future hospitalizations: no    Any changes in medication regimen? no    Recent/future dental: no  Any missed doses?: no       Is patient compliant with meds? yes       Allergies: 1)  ! Macrodantin (Nitrofurantoin Macrocrystal) 2)  ! Codeine Sulfate (Codeine Sulfate) 3)  Atenolol (Atenolol) 4)  Percocet (Oxycodone-Acetaminophen) 5)  Biaxin  Anticoagulation Management History:      Her anticoagulation is being managed by telephone today.  Positive risk factors for bleeding include an age of 35 years or older, history of GI bleeding, and presence of serious comorbidities.  The bleeding index is 'high risk'.  Positive CHADS2 values include History of HTN and Age > 24 years old.  Her last INR was 1.6 ratio.  Anticoagulation responsible provider: Eden Emms MD, Theron Arista.  INR POC: 2.37.  Exp: 11/2010.    Anticoagulation Management Assessment/Plan:      The patient's current anticoagulation dose is Warfarin sodium 5 mg tabs: as directed.  The target INR is 2.0-3.0.  The next INR is due 12/12/2009.  Anticoagulation instructions were given to Daughter.  Results were reviewed/authorized by Weston Brass, PharmD.  She was  notified by Weston Brass PharmD.         Prior Anticoagulation Instructions: INR 2.37 LMOM to call for dosing. Bethena Midget, RN, BSN  November 28, 2009 3:41 PM  [facility] for pt to continue same dose and call for dosing. Bethena Midget, RN, BSN  November 28, 2009 4:30 PM   Current Anticoagulation Instructions: INR 2.37  Spoke with pt.  Continue same dose of 1/2 tablet every day except 1 tablet on Monday, Wednesday and Friday.  Recheck INR in 2 weeks.  Orders given to [facility] with [facility].

## 2010-02-21 NOTE — Assessment & Plan Note (Signed)
Summary: LEG IS SWOLLEN/NWS   Vital Signs:  Patient profile:   77 year old female Weight:      174 pounds Temp:     96.8 degrees F oral Pulse rate:   92 / minute BP sitting:   102 / 54  (left arm)  Vitals Entered By: Tora Perches (March 13, 2009 11:08 AM) CC: right leg swelling Is Patient Diabetic? No   Primary Care Provider:  Tresa Garter MD  CC:  right leg swelling.  History of Present Illness: The patient presents for a follow up of hypertension, LBP, hyperlipidemia. C/o leg edema not better L>>R,  Rleg hurts some too   Preventive Screening-Counseling & Management  Alcohol-Tobacco     Smoking Status: never  Current Medications (verified): 1)  Temazepam 30 Mg  Caps (Temazepam) .... Take 1 Tablet By Mouth Once A Day 2)  Triamterene-Hctz 75-50 Mg  Tabs (Triamterene-Hctz) .... Take 1 Tablet By Mouth Once A Day 3)  Vitamin B Complex   Caps (B Complex Vitamins) .... Take 1 Tablet By Mouth Once A Day 4)  Vitamin D3 1000 Unit  Tabs (Cholecalciferol) .[name].. 1 Qd 5)  Benazepril Hcl 20 Mg  Tabs (Benazepril Hcl) .... 2 By Mouth Daily 6)  Amlodipine Besylate 5 Mg  Tabs (Amlodipine Besylate) .[name].. 1 By Mouth Qd 7)  Aspirin 81 Mg Tbec (Aspirin) .... Take One Tablet By Mouth Daily 8)  Metoprolol Tartrate 50 Mg Tabs (Metoprolol Tartrate) .... Take One Tablet By Mouth Twice A Day 9)  Warfarin Sodium 2.5 Mg Tabs (Warfarin Sodium) .... Take One Tablet Daily 10)  Diltiazem Hcl Er Beads 180 Mg Xr24h-Cap (Diltiazem Hcl Er Beads) .... Take One Capsule By Mouth Daily 11)  Oxycodone-Acetaminophen 5-325 Mg Tabs (Oxycodone-Acetaminophen) .... As Needed 12)  Furosemide 40 Mg Tabs (Furosemide) .[name].. 1 By Mouth Q Am ( A Water Pill) As Needed Swelling  Allergies: 1)  ! Macrodantin (Nitrofurantoin Macrocrystal) 2)  ! Codeine Sulfate (Codeine Sulfate) 3)  ! Atenolol (Atenolol) 4)  Percocet (Oxycodone-Acetaminophen)  Past History:  Past Medical History: Last updated: 02/12/2009 Paroxysmal  Atrial Fibrillation on coumadin therapy Hypertension Low back pain, severe lumbar spondylosis s/p L3/L4, L4/L5 fusion 12/10. Osteoarthritis Osteopenia Elevated mcv 790.09 Constipation  564.00Iischemic colitis with rectal bleeding  558.9 Borderline low B12  Physical Exam  General:  General: Well developed, well nourished, NAD HEENT: OP clear, mucus membranes moist SKIN: warm, dry Neuro: No focal deficits Musculoskeletal: Muscle strength 5/5 all ext Psychiatric: Mood and affect normal Neck: No JVD, no carotid bruits, no thyromegaly, no lymphadenopathy. Lungs:Clear bilaterally, no wheezes, rhonci, crackles CV: RRR no murmurs, gallops rubs Abdomen: soft, NT, ND, BS present   Msk:  Using a walker LS in a brace Extremities:  NT trace left pedal edema and 1+ right pedal edema.   Neurologic:  alert & oriented X3.   Skin:  no cellulitis Psych:  Cognition and judgment appear intact. Alert and cooperative with normal attention span and concentration. No apparent delusions, illusions, hallucinations   Impression & Recommendations:  Problem # 1:  EDEMA (ICD-782.3) R>L LE Assessment Deteriorated Cut back on Norvasc - take 1/2 Elevate legs Her updated medication list for this problem includes:    Triamterene-hctz 75-50 Mg Tabs (Triamterene-hctz) .[name]... Take 1 tablet by mouth once a day    Furosemide 40 Mg Tabs (Furosemide) .[name]... 1.5 tab by mouth q am ( a water pill) as needed swelling  Orders: Doppler Referral (Doppler) r/o DVT  Problem # 2:  HYPERTENSION (ICD-401.9) Assessment: Improved  The following medications were removed from the medication list:    Diltiazem Hcl Er Beads 180 Mg Xr24h-cap (Diltiazem hcl er beads) .[name]... Take one capsule by mouth daily Her updated medication list for this problem includes:    Triamterene-hctz 75-50 Mg Tabs (Triamterene-hctz) .[name]... Take 1 tablet by mouth once a day    Benazepril Hcl 20 Mg Tabs (Benazepril hcl) .[name]... 2 by mouth daily     Amlodipine Besylate 5 Mg Tabs (Amlodipine besylate) .[name]... 1/2 by mouth once daily    Metoprolol Tartrate 50 Mg Tabs (Metoprolol tartrate) .[name]... Take one tablet by mouth twice a day    Furosemide 40 Mg Tabs (Furosemide) .[name]... 1.5 tab by mouth q am ( a water pill) as needed swelling  Problem # 3:  ATRIAL FIBRILLATION (ICD-427.31) Assessment: Unchanged  The following medications were removed from the medication list:    Diltiazem Hcl Er Beads 180 Mg Xr24h-cap (Diltiazem hcl er beads) .[name]... Take one capsule by mouth daily Her updated medication list for this problem includes:    Amlodipine Besylate 5 Mg Tabs (Amlodipine besylate) .[name]... 1/2 by mouth once daily    Aspirin 81 Mg Tbec (Aspirin) .[name]... Take one tablet by mouth daily    Metoprolol Tartrate 50 Mg Tabs (Metoprolol tartrate) .[name]... Take one tablet by mouth twice a day    Warfarin Sodium 2.5 Mg Tabs (Warfarin sodium) .[name]... Take one tablet daily  Problem # 4:  LOW BACK PAIN (ICD-724.2) post-op - cont PT Assessment: Improved  Her updated medication list for this problem includes:    Aspirin 81 Mg Tbec (Aspirin) .[name]... Take one tablet by mouth daily    Oxycodone-acetaminophen 5-325 Mg Tabs (Oxycodone-acetaminophen) .[name]... As needed  Complete Medication List: 1)  Temazepam 30 Mg Caps (Temazepam) .... Take 1 tablet by mouth once a day 2)  Triamterene-hctz 75-50 Mg Tabs (Triamterene-hctz) .... Take 1 tablet by mouth once a day 3)  Vitamin B Complex Caps (B complex vitamins) .... Take 1 tablet by mouth once a day 4)  Vitamin D3 1000 Unit Tabs (Cholecalciferol) .[name].. 1 qd 5)  Benazepril Hcl 20 Mg Tabs (Benazepril hcl) .... 2 by mouth daily 6)  Amlodipine Besylate 5 Mg Tabs (Amlodipine besylate) .... 1/2 by mouth once daily 7)  Aspirin 81 Mg Tbec (Aspirin) .... Take one tablet by mouth daily 8)  Metoprolol Tartrate 50 Mg Tabs (Metoprolol tartrate) .... Take one tablet by mouth twice a day 9)  Warfarin Sodium 2.5 Mg Tabs (Warfarin sodium) ....  Take one tablet daily 10)  Oxycodone-acetaminophen 5-325 Mg Tabs (Oxycodone-acetaminophen) .... As needed 11)  Furosemide 40 Mg Tabs (Furosemide) .... 1.5 tab by mouth q am ( a water pill) as needed swelling  Patient Instructions: 1)  Keep return office visit  2)  Call if you are not better in a reasonable amount of time or if worse.

## 2010-02-21 NOTE — Miscellaneous (Signed)
Summary: ref  Clinical Lists Changes  Medications: Rx of TRIAMTERENE-HCTZ 75-50 MG  TABS (TRIAMTERENE-HCTZ) Take 1 tablet by mouth once a day;  #30 x 12;  Signed;  Entered by: Tresa Garter MD;  Authorized by: Tresa Garter MD;  Method used: [facility] [address] [phone]*, [address], [place], [place], [place]  [address], Ph: [phone] or [phone], Fax: [phone] Rx of CELEBREX 200 MG  CAPS (CELECOXIB) once daily;  #60 x 12;  Signed;  Entered by: Tresa Garter MD;  Authorized by: Tresa Garter MD;  Method used: [facility] [address] [phone]*, [address] [place], [place], [place]  [address], Ph: [phone] or [phone], Fax: [phone] Rx of BENAZEPRIL HCL 20 MG  TABS (BENAZEPRIL HCL) 2 by mouth daily;  #60 x 10;  Signed;  Entered by: Tresa Garter MD;  Authorized by: Tresa Garter MD;  Method used: [facility]ically to [facility] [address] [phone]*, [address] [place], [place], [place]  [address], Ph: [phone] or [phone], Fax: [phone] Rx of AMLODIPINE BESYLATE 5 MG  TABS (AMLODIPINE BESYLATE) 1 by mouth qd;  #30 x 10;  Signed;  Entered by: Tresa Garter MD;  Authorized by: Tresa Garter MD;  Method used: Electronically to [facility] [address] [phone]*, [address] [place], [place], [place]  [address], Ph: [phone] or [phone], Fax: [phone]    Prescriptions: AMLODIPINE BESYLATE 5 MG  TABS (AMLODIPINE BESYLATE) 1 by mouth qd  #30 x 10   Entered and Authorized by:   Tresa Garter MD   Signed by:   Tresa Garter MD on 11/04/2008   Method used:   Electronically to        [facility] [address] [phone]* (retail)       [address]       [place], [place]  [address]       Ph: [phone] or [phone]       Fax: [phone]   RxID:   [id number] BENAZEPRIL HCL 20 MG  TABS (BENAZEPRIL HCL) 2 by mouth daily   #60 x 10   Entered and Authorized by:   Tresa Garter MD   Signed by:   Tresa Garter MD on 11/04/2008   Method used:   Electronically to        [facility] [address] [phone]* (retail)       [address]       [place], [place]  [address]       Ph: [phone] or [phone]       Fax: [phone]   RxID:   [id number] CELEBREX 200 MG  CAPS (CELECOXIB) once daily  #60 x 12   Entered and Authorized by:   Tresa Garter MD   Signed by:   Tresa Garter MD on 11/04/2008   Method used:   Electronically to        [facility] [address] [phone]* (retail)       [address]       [place], [place]  [address]       Ph: [phone] or [phone]       Fax: [phone]   RxID:   [id number] TRIAMTERENE-HCTZ 75-50 MG  TABS (TRIAMTERENE-HCTZ) Take 1 tablet by mouth once a day  #  30 x 12   Entered and Authorized by:   Tresa Garter MD   Signed by:   Tresa Garter MD on 11/04/2008   Method used:   Electronically to        [facility] [address] [phone]* (retail)       [address]       [place], [place]  [address]       Ph: [phone] or [phone]       Fax: [phone]   RxID:   [id number]

## 2010-02-21 NOTE — Assessment & Plan Note (Signed)
Summary: APPT @ 11:15/EPH   Visit Type:  rov Referring Provider:  Tresa Garter MD Primary Provider:  Tresa Garter MD  CC:  edema/ankles....pt c/o itching....  History of Present Illness: 77 yo WF with history of HTN and paroxysmal atrial fibrillation who is here today for follow up.  She has had chronic atrial flutter/fibrillation over the past year and has been managed on coumadin and rate control agents. I saw her husband two weeks ago and he told me that she had been doing poorly with anemia, weakness and continued back pain. She had been diagnosed with anemia with dark stools. Coumadin was held and upper endoscopy showed several small clean based gastric ulcers.  I spoke with her daughter and urged her to take Ms. Walthers to the ED if she continued to feel poorly. They called on October 20th and stated that she felt terrible. I asked them to come by for a CBC. I saw her over in the lab and based on her appearance, we talked her into admission to [facility]. She was found to be dehydrated with acute renal failure. She was hydrated and this resolved. Her hemoglobin remained stable. No further evidence of GI bleeding. She had several episodes of RVR with her atrial fib in the hospital. Overall feeling much better. Plans for repeat CBC today per Dr. Posey Rea. Her energy is better. No SOB or chest pain. In the hospital, Toprol increased to 75 mg two times a day and Cardizem CD 120 mg once daily added. She has only been taking Toprol 25 mg two times a day. Coumadin has been restarted without evidence of further GI bleeding.    Current Medications (verified): 1)  Temazepam 30 Mg  Caps (Temazepam) .... Take 1 Tablet By Mouth Once A Day 2)  Furosemide 40 Mg Tabs (Furosemide) .[name].. 1 By Mouth Every Other Day As Needed Swelling 3)  Vitamin D3 1000 Unit  Tabs (Cholecalciferol) .[name].. 1 Qd 4)  Vitamin B Complex   Caps (B Complex Vitamins) .... Take 1 Tablet By Mouth Once A Day 5)  Calcium  Carbonate-Vitamin D 600-400 Mg-Unit  Tabs (Calcium Carbonate-Vitamin D) .... Take 1 Tablet By Mouth Once A Day 6)  Tramadol Hcl 50 Mg Tabs (Tramadol Hcl) .[name].. 1-2 Tabs By Mouth Two Times A Day As Needed Pain 7)  Synthroid 25 Mcg Tabs (Levothyroxine Sodium) .[name].. 1 By Mouth Once Daily For Thyroid 8)  Wheelchair .... As Dirr 9)  Prevpac  Misc (Amoxicill-Clarithro-Lansopraz) .... As Directed 10)  Cardizem Cd 120 Mg Xr24h-Cap (Diltiazem Hcl Coated Beads) .[name].. 1 Cap Once Daily 11)  Ferrous Sulfate 325 (65 Fe) Mg Tabs (Ferrous Sulfate) .[name].. 1 Tab Two Times A Day 12)  Metoprolol Succinate 25 Mg Xr24h-Tab (Metoprolol Succinate) .[name].. 1 Tab Two Times A Day 13)  Protonix 40 Mg Tbec (Pantoprazole Sodium) .[name].. 1 Tab Once Daily 14)  Warfarin Sodium 5 Mg Tabs (Warfarin Sodium) .... As Directed  Allergies: 1)  ! Macrodantin (Nitrofurantoin Macrocrystal) 2)  ! Codeine Sulfate (Codeine Sulfate) 3)  ! Atenolol (Atenolol) 4)  Percocet (Oxycodone-Acetaminophen)  Past History:  Past Medical History: Reviewed history from 10/31/2009 and no changes required. Paroxysmal Atrial Fibrillation on coumadin therapy Hypertension Low back pain, severe lumbar spondylosis s/p L3/L4, L4/L5 fusion 12/10. Osteoarthritis Dr Juliene Pina Osteopenia Elevated mcv 790.09 Constipation  564.00Iischemic colitis with rectal bleeding  558.9 Borderline low B12 Renal insufficiency Hypothyroidism 2011  NSAID gastropathy w/bleed 2011  Anemia-iron deficiency 2011  Social History: Reviewed history from  10/26/2009 and no changes required. Never Smoked 1 glass of wine per day No illicit drugs Retired [employer] [place] Married, 2 children-ages [phone].     Review of Systems       The patient complains of fatigue.  The patient denies malaise, fever, weight gain/loss, vision loss, decreased hearing, hoarseness, chest pain, palpitations, shortness of breath, prolonged cough, wheezing, sleep apnea, coughing up blood, abdominal pain, blood  in stool, nausea, vomiting, diarrhea, heartburn, incontinence, blood in urine, muscle weakness, joint pain, leg swelling, rash, skin lesions, headache, fainting, dizziness, depression, anxiety, enlarged lymph nodes, easy bruising or bleeding, and environmental allergies.    Vital Signs:  Patient profile:   77 year old female Height:      64 inches Weight:      168.4 pounds BMI:     29.01 Pulse rate:   82 / minute Pulse rhythm:   regular BP sitting:   118 / 62  (right arm) Cuff size:   regular  Vitals Entered By: Danielle Rankin, CMA (November 19, 2009 10:59 AM)  Physical Exam  General:  General: Well developed, well nourished, NAD HEENT: OP clear, mucus membranes moist SKIN: warm, dry Psychiatric: Mood and affect normal Neck: No JVD, no carotid bruits, no thyromegaly, no lymphadenopathy. Lungs:Clear bilaterally, no wheezes, rhonci, crackles [id number],  no murmurs, gallops rubs Abdomen: soft, NT, ND, BS present Extremities: No edema, pulses 2+.    Impression & Recommendations:  Problem # 1:  ATRIAL FIBRILLATION (ICD-427.31) Rate controlled. Will increase Toprol XL to 50 mg by mouth two times a day. Continue cardizem CD 120 mg by mouth Qdaily. She is back on coumadin for reduction of risk of CVA with her atrial fibrillation. Overall doing much better. She is not aware of any palpitations or irregularity of her heart rhythm. She has never had symptoms with RVR.   The following medications were removed from the medication list:    Metoprolol Succinate 25 Mg Xr24h-tab (Metoprolol succinate) .[name]... 1 two times a day    Aspirin 81 Mg Tbec (Aspirin) .[name]... Take one tablet by mouth daily Her updated medication list for this problem includes:    Metoprolol Succinate 50 Mg Xr24h-tab (Metoprolol succinate) .[name]... Take one tablet by mouth two times a day.    Warfarin Sodium 5 Mg Tabs (Warfarin sodium) .[name]... As directed  Patient Instructions: 1)  Your physician recommends that you schedule a  follow-up appointment in: 6 months 2)  Your physician has recommended you make the following change in your medication: Increase your Metoprolol Succinate to 50mg  by mouth two times a day. Prescriptions: METOPROLOL SUCCINATE 50 MG XR24H-TAB (METOPROLOL SUCCINATE) Take one tablet by mouth two times a day.  #60 x 8   Entered by:   Whitney Maeola Sarah RN   Authorized by:   Verne Carrow, MD   Signed by:   Ellender Hose RN on 11/19/2009   Method used:   [facility]ically to        [facility] [address] [phone]* (retail)       [address]       [place], [place]  [address]       Ph: [phone] or [phone]       Fax: [phone]   RxID:   [id number]

## 2010-02-21 NOTE — Progress Notes (Signed)
Summary: [facility] - HOME HEALTH  Phone Note From Other Clinic   Caller: [phone] Beth - RN  Summary of Call: Home health RN called. Pt is going to outpt services. Pt would like therapy & RN visits to stop. FYI for MD. Pt has office visit comming up w/Dr [name], pt will discuss need for further care w/MD at this time.  Initial call taken by: Lamar Sprinkles, CMA,  December 17, 2009 3:46 PM  Follow-up for Phone Call        ok Thank you!  Follow-up by: Tresa Garter MD,  December 17, 2009 6:07 PM

## 2010-02-21 NOTE — Medication Information (Signed)
Summary: rov/ewj  Anticoagulant Therapy  Managed by: Bethena Midget, RN, BSN Referring MD: Verne Carrow PCP: Tresa Garter MD Supervising MD: Shirlee Latch MD, Raelea Gosse Indication 1: Atrial Fibrillation Lab Used: [facility] [place] Site: [address] INR POC 2.0 INR RANGE 2.0-3.0  Dietary changes: no    Health status changes: no    Bleeding/hemorrhagic complications: no    Recent/future hospitalizations: no    Any changes in medication regimen? no    Recent/future dental: no  Any missed doses?: no       Is patient compliant with meds? yes       Allergies: 1)  ! Macrodantin (Nitrofurantoin Macrocrystal) 2)  ! Codeine Sulfate (Codeine Sulfate) 3)  ! Atenolol (Atenolol) 4)  Percocet (Oxycodone-Acetaminophen)  Anticoagulation Management History:      The patient is taking warfarin and comes in today for a routine follow up visit.  Positive risk factors for bleeding include an age of 75 years or older and presence of serious comorbidities.  The bleeding index is 'intermediate risk'.  Positive CHADS2 values include History of HTN and Age > 36 years old.  Her last INR was 2.6.  Anticoagulation responsible provider: Shirlee Latch MD, Jeiry Birnbaum.  INR POC: 2.0.  Cuvette Lot#: [id number].  Exp: 06/2010.    Anticoagulation Management Assessment/Plan:      The patient's current anticoagulation dose is Warfarin sodium 2.5 mg tabs: take one tablet daily.  The target INR is 2.0-3.0.  The next INR is due 05/25/2009.  Anticoagulation instructions were given to patient.  Results were reviewed/authorized by Bethena Midget, RN, BSN.  She was notified by Bethena Midget, RN, BSN.         Prior Anticoagulation Instructions: INR 1.9  Take 1.5 tablets today then resume same dosage 1/2 tablet daily except 1 tablet on Mondays, Wednesdays, and Fridays.  Recheck in 3 weeks.    Current Anticoagulation Instructions: INR 2.0 Change dose 5mg s daily except  2.5mg s on Tuesdays, Thursdays and  Saturdays. Recheck in 2 weeks.

## 2010-02-21 NOTE — Assessment & Plan Note (Signed)
Summary: post hosp/per md/lb   Vital Signs:  Patient profile:   77 year old female Height:      64 inches Weight:      168 pounds BMI:     28.94 Temp:     99.0 degrees F oral Pulse rate:   88 / minute Pulse rhythm:   irregular Resp:     16 per minute BP sitting:   128 / 60  (left arm) Cuff size:   regular  Vitals Entered By: Lanier Prude, CMA(AAMA) (November 22, 2009 11:19 AM)  CC: hospital f/u Is Patient Diabetic? No   Primary Care Provider:  Tresa Garter MD  CC:  hospital f/u.  History of Present Illness: The patient presents for a post-hospital visit for anemia, ulcers and  elev MCV C/o rash on back itching at night , fatigue - not better, insomnia C/o LBP at night  Current Medications (verified): 1)  Temazepam 30 Mg  Caps (Temazepam) .... Take 1 Tablet By Mouth Once A Day 2)  Furosemide 40 Mg Tabs (Furosemide) .[name].. 1 By Mouth Every Other Day As Needed Swelling 3)  Vitamin D3 1000 Unit  Tabs (Cholecalciferol) .[name].. 1 Qd 4)  Vitamin B Complex   Caps (B Complex Vitamins) .... Take 1 Tablet By Mouth Once A Day 5)  Calcium Carbonate-Vitamin D 600-400 Mg-Unit  Tabs (Calcium Carbonate-Vitamin D) .... Take 1 Tablet By Mouth Once A Day 6)  Synthroid 25 Mcg Tabs (Levothyroxine Sodium) .[name].. 1 By Mouth Once Daily For Thyroid 7)  Wheelchair .... As Dirr 8)  Cardizem Cd 120 Mg Xr24h-Cap (Diltiazem Hcl Coated Beads) .[name].. 1 Cap Once Daily 9)  Ferrous Sulfate 325 (65 Fe) Mg Tabs (Ferrous Sulfate) .[name].. 1 Tab Two Times A Day 10)  Metoprolol Succinate 50 Mg Xr24h-Tab (Metoprolol Succinate) .... Take One Tablet By Mouth Two Times A Day. 11)  Protonix 40 Mg Tbec (Pantoprazole Sodium) .[name].. 1 Tab Once Daily 12)  Warfarin Sodium 5 Mg Tabs (Warfarin Sodium) .... As Directed 13)  Hydroxyzine Hcl 25 Mg Tabs (Hydroxyzine Hcl) .[name].. 1-2 Tabs By Mouth Two Times A Day As Needed Itching  Allergies (verified): 1)  ! Macrodantin (Nitrofurantoin Macrocrystal) 2)  ! Codeine Sulfate (Codeine  Sulfate) 3)  Atenolol (Atenolol) 4)  Percocet (Oxycodone-Acetaminophen) 5)  Biaxin  Past History:  Past Surgical History: Last updated: 10/26/2009 Left rotator cuff surgery 1997 Tonsillectomy Back surgery-12/10 L3/L4/L5 fusion    Social History: Last updated: 10/26/2009 Never Smoked 1 glass of wine per day No illicit drugs Retired [employer] Married, 2 children-ages [phone].     Past Medical History: Paroxysmal Atrial Fibrillation on coumadin therapy Hypertension Low back pain, severe lumbar spondylosis s/p L3/L4, L4/L5 fusion 12/10. Osteoarthritis Dr Juliene Pina Osteopenia Elevated mcv 790.09 Constipation  564.00Iischemic colitis with rectal bleeding  558.9 Borderline low B12 Renal insufficiency Hypothyroidism 2011  NSAID gastropathy w/bleed 2011  Anemia-iron deficiency 2011and elev. MCV Peptic ulcer disease  Review of Systems       The patient complains of dyspnea on exertion, suspicious skin lesions, and difficulty walking.  The patient denies fever, chest pain, syncope, peripheral edema, prolonged cough, abdominal pain, melena, and hematochezia.    Physical Exam  General:   ill-appearing , pale, NAD Head:  Normocephalic and atraumatic without obvious abnormalities. No apparent alopecia or balding. Eyes:  No corneal or conjunctival inflammation noted. EOMI. Perrla. Mouth:  Not dry Neck:  supple Lungs:  CTA Heart:  Irreg irreg Abdomen:  Bowel sounds positive,abdomen soft  and non-tender without masses, organomegaly or hernias noted. Msk:  Using a walker Lumbar-sacral spine is tender to palpation over paraspinal muscles and painfull with the ROM  Extremities:  no edema Neurologic:  alert & oriented X3.   Skin:  eryth patchy rash on back Cervical Nodes:  No lymphadenopathy noted Psych:  Oriented X3.     Impression & Recommendations:  Problem # 1:  ANEMIA-IRON DEFICIENCY (ICD-280.9) w/elev. MCV Assessment Unchanged  Her updated medication list for this  problem includes:    Ferrous Sulfate 325 (65 Fe) Mg Tabs (Ferrous sulfate) .[name]... 1 tab two times a day    Ferrex 150 Forte 150-25-1 Mg-mcg-mg Caps (Iron polysacch cmplx-b12-fa) .[name]... 1 by mouth two times a day  take with vit c 250 mg two times a day  Orders: Hematology Referral (Hematology) to consider IV Iron  Problem # 2:  MELENA (ICD-578.1) resolved Assessment: Improved  Problem # 3:  HIP PAIN (ICD-719.45) R groin Assessment: Improved MRI if not better X ray with OA  Problem # 4:  HYPOTHYROIDISM (ICD-244.9) Assessment: New  Her updated medication list for this problem includes:    Synthroid 25 Mcg Tabs (Levothyroxine sodium) .[name]... 1 by mouth once daily for thyroid  Problem # 5:  ATRIAL FIBRILLATION (ICD-427.31) Assessment: Unchanged  Her updated medication list for this problem includes:    Cardizem Cd 120 Mg Xr24h-cap (Diltiazem hcl coated beads) .[name]... 1 cap once daily    Metoprolol Succinate 50 Mg Xr24h-tab (Metoprolol succinate) .[name]... Take one tablet by mouth two times a day.    Warfarin Sodium 5 Mg Tabs (Warfarin sodium) .[name]... As directed  Problem # 6:  SKIN RASH (ICD-782.1) poss due to Biaxin vs other Assessment: New If not better will hold Metoprolol, then Cardizem  Her updated medication list for this problem includes:    Triamcinolone Acetonide 0.5 % Crea (Triamcinolone acetonide) ..... Use two times a day prn  Problem # 7:  RENAL INSUFFICIENCY (ICD-588.9) Assessment: Unchanged  Problem # 8:  HYPERTENSION (ICD-401.9) Assessment: Improved  Her updated medication list for this problem includes:    Furosemide 40 Mg Tabs (Furosemide) .[name]... 1 by mouth every other day as needed swelling    Cardizem Cd 120 Mg Xr24h-cap (Diltiazem hcl coated beads) .[name]... 1 cap once daily    Metoprolol Succinate 50 Mg Xr24h-tab (Metoprolol succinate) .[name]... Take one tablet by mouth two times a day.  Problem # 9:  HIP PAIN (ICD-719.45)  Problem # 10:  PEPTIC ULCER DISEASE (ICD-533.90)  due to NSAIDs Assessment: Improved Finished Prevpac Her updated medication list for this problem includes:    Protonix 40 Mg Tbec (Pantoprazole sodium) .[name]... 1 tab once daily  Complete Medication List: 1)  Temazepam 30 Mg Caps (Temazepam) .... Take 1 tablet by mouth once a day 2)  Furosemide 40 Mg Tabs (Furosemide) .[name].. 1 by mouth every other day as needed swelling 3)  Vitamin D3 1000 Unit Tabs (Cholecalciferol) .[name].. 1 qd 4)  Vitamin B Complex Caps (B complex vitamins) .... Take 1 tablet by mouth once a day 5)  Calcium Carbonate-vitamin D 600-400 Mg-unit Tabs (Calcium carbonate-vitamin d) .... Take 1 tablet by mouth once a day 6)  Synthroid 25 Mcg Tabs (Levothyroxine sodium) .[name].. 1 by mouth once daily for thyroid 7)  Cardizem Cd 120 Mg Xr24h-cap (Diltiazem hcl coated beads) .[name].. 1 cap once daily 8)  Ferrous Sulfate 325 (65 Fe) Mg Tabs (Ferrous sulfate) .[name].. 1 tab two times a day 9)  Metoprolol Succinate 50  Mg Xr24h-tab (Metoprolol succinate) .... Take one tablet by mouth two times a day. 10)  Protonix 40 Mg Tbec (Pantoprazole sodium) .[name].. 1 tab once daily 11)  Warfarin Sodium 5 Mg Tabs (Warfarin sodium) .... As directed 12)  Hydroxyzine Hcl 25 Mg Tabs (Hydroxyzine hcl) .[name].. 1-2 tabs by mouth two times a day as needed itching 13)  Triamcinolone Acetonide 0.5 % Crea (Triamcinolone acetonide) .... Use two times a day prn 14)  Ferrex 150 Forte 150-25-1 Mg-mcg-mg Caps (Iron polysacch cmplx-b12-fa) .[name].. 1 by mouth two times a day  take with vit c 250 mg two times a day 15)  Wheelchair  .... As dirr 16)  Loratadine 10 Mg Tabs (Loratadine) .[name].. 1 by mouth once daily as needed allergies  Patient Instructions: 1)  Please schedule a follow-up appointment in 1 month. 2)  BMP prior to visit, ICD-9: 3)  CBC w/ Diff prior to visit, ICD-9: 4)  iron/TIBC 280.9 5)  SPEP and UPEP 6)  Vit B12 782.0 7)  LFT  995.20 8)  ESR Prescriptions: LORATADINE 10 MG TABS (LORATADINE) 1 by mouth once daily as needed  allergies  #30 x 6   Entered and Authorized by:   Tresa Garter MD   Signed by:   Tresa Garter MD on 11/22/2009   Method used:   Print then Give to Patient   RxID:   [id number] FERREX 150 FORTE 150-25-1 MG-MCG-MG CAPS (IRON POLYSACCH CMPLX-B12-FA) 1 by mouth two times a day  Take with Vit C 250 mg two times a day  #30 x 6   Entered and Authorized by:   Tresa Garter MD   Signed by:   Tresa Garter MD on 11/22/2009   Method used:   Print then Give to Patient   RxID:   [id number] TRIAMCINOLONE ACETONIDE 0.5 % CREA (TRIAMCINOLONE ACETONIDE) use two times a day prn  #120 g x 3   Entered and Authorized by:   Tresa Garter MD   Signed by:   Tresa Garter MD on 11/22/2009   Method used:   [facility]        [facility] [address] [phone]* (retail)       [address]       [place], [place]  [address]       Ph: [phone] or [phone]       Fax: [phone]   RxID:   [id number]    Orders Added: 1)  Hematology Referral [Hematology] 2)  Est. Patient Level V [02542]   Immunization History:  Influenza Immunization History:    Influenza:  historical (10/23/2009)   Immunization History:  Influenza Immunization History:    Influenza:  Historical (10/23/2009)

## 2010-02-21 NOTE — Medication Information (Signed)
Summary: Coumadin Clinic  Anticoagulant Therapy  Managed by: Weston Brass, PharmD Referring MD: Verne Carrow PCP: Georgina Quint [name] MD Supervising MD: Riley Kill MD, Maisie Fus Indication 1: Atrial Fibrillation Lab Used: [facility] [place] Site: [address] PT 20.2 INR POC 1.7 INR RANGE 2.0-3.0  Dietary changes: no    Health status changes: no    Bleeding/hemorrhagic complications: no    Recent/future hospitalizations: no    Any changes in medication regimen? no    Recent/future dental: no  Any missed doses?: no       Is patient compliant with meds? yes       Allergies: 1)  ! Macrodantin (Nitrofurantoin Macrocrystal) 2)  ! Codeine Sulfate (Codeine Sulfate) 3)  ! Atenolol (Atenolol) 4)  Percocet (Oxycodone-Acetaminophen)  Anticoagulation Management History:      Her anticoagulation is being managed by telephone today.  Positive risk factors for bleeding include an age of 75 years or older, history of GI bleeding, and presence of serious comorbidities.  The bleeding index is 'high risk'.  Positive CHADS2 values include History of HTN and Age > 33 years old.  Her last INR was 1.6 ratio.  Prothrombin time is 20.2.  Anticoagulation responsible provider: Riley Kill MD, Maisie Fus.  INR POC: 1.7.  Exp: 11/2010.    Anticoagulation Management Assessment/Plan:      The patient's current anticoagulation dose is Warfarin sodium 5 mg tabs: as directed.  The target INR is 2.0-3.0.  The next INR is due 11/28/2009.  Anticoagulation instructions were given to Daughter.  Results were reviewed/authorized by Weston Brass, PharmD.  She was notified by Weston Brass PharmD.         Prior Anticoagulation Instructions: INR 1.73 Skip today and tomorrow take 2.5mg s then resume 2.5mg  daily except 5mg s MWF. Orders sent to [facility].   Current Anticoagulation Instructions: INR 1.7  Spoke with pt.  Take 1 1/2 tablets today then resume same dose of 1/2 tablet every day except 1 tablet on  Monday, Wednesday and Friday.  Recheck INR in 1 week.  Gave orders to [facility] with [facility].
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
## 2010-02-21 NOTE — Medication Information (Signed)
Summary: rov/sp  Anticoagulant Therapy  Managed by: [name], PharmD Referring Bell: Verne Carrow PCP: [name] Supervising Bell: [name], [name] Indication 1: Atrial Fibrillation Lab Used: [facility] [place] Site: [address] INR POC 2.1 INR RANGE 2.0-3.0  Dietary changes: no    Health status changes: no    Bleeding/hemorrhagic complications: no    Recent/future hospitalizations: no    Any changes in medication regimen? no    Recent/future dental: no  Any missed doses?: no       Is patient compliant with meds? yes       Allergies: 1)  ! Macrodantin (Nitrofurantoin Macrocrystal) 2)  ! Codeine Sulfate (Codeine Sulfate) 3)  ! Atenolol (Atenolol) 4)  Percocet (Oxycodone-Acetaminophen)  Anticoagulation Management History:      The patient is taking warfarin and comes in today for a routine follow up visit.  Positive risk factors for bleeding include an age of 38 years or older and presence of serious comorbidities.  The bleeding index is 'intermediate risk'.  Positive CHADS2 values include History of HTN and Age > 14 years old.  Her last INR was 2.6.  Anticoagulation responsible provider: Daleen Squibb Bell, [name].  INR POC: 2.1.  Cuvette Lot#: [id number].  Exp: 08/12.    Anticoagulation Management Assessment/Plan:      The patient's current anticoagulation dose is Warfarin sodium 5 mg tabs: Use as directed by Anticoagulation Clinic.  The target INR is 2.0-3.0.  The next INR is due 07/12/2009.  Anticoagulation instructions were given to patient.  Results were reviewed/authorized by [name], PharmD.  She was notified by [name].         Prior Anticoagulation Instructions: INR 1.9  Increase dose to 1 tablet every day except 1/2 tablet on Tuesday and Thursday   Current Anticoagulation Instructions: INR 2.1  Continue taking 1/2 tablet on Tues and Thursday and 1 tablet all other days.  Return to clinic in 4 weeks.

## 2010-02-21 NOTE — Letter (Signed)
Summary: [facility] Consult Scheduled Letter  [facility]  [address] [place], [place] [address]   Phone: [phone]  Fax: [phone]      09/06/2008 MRN: [id number]  Katurah Hankerson [address] [place], [place]  [address]    Dear Ms. Matousek,      We have scheduled an appointment for you.  At the recommendation of Dr.[name], we have scheduled you a consult with (Dr.Roy) [facility] on October 22.2010 at 1:00.  Their phone number is [phone].  If this appointment day and time is not convenient for you, please feel free to call the office of the doctor you are being referred to at the number listed above and reschedule the appointment.    Thank you,  Patient Care Coordinator [facility]

## 2010-02-21 NOTE — Assessment & Plan Note (Signed)
Summary: f/u echo/sl   Visit Type:  Follow-up Primary Provider:  Tresa Garter MD  CC:  follow up echo and monitor. pt reports that she is having difficulty ambulating due to her back.  History of Present Illness: 77 yo WF with history of HTN who was recently seen by Dr. Posey Rea for a routine assessment and she was found to be in atrial fibrillation. She was unaware of this and did not feel any palpitations, fluttering, chest pain or SOB. She was referred for further evaluation 07/18/08. She has not been aware of any irregularities of her heart rhythm in the past. She has had no prior cardiac issues. She does not abuse alcohol and does not drink excessive caffeine. She recently wore a 21 day event monitor which showed episodes of atrial fibrillation and several short pauses. Her beta blocker dose was lowered and she had no further sinus pauses on the monitor. Her only compaint today is lower back and hip pain. She has been seen for this by her orthopedic surgeon but no plans are currently in place for a surgical repair. She denies CP, SOB, palpitations, dizziness, orthopnea, PND, lower ext edema.   Current Medications (verified): 1)  Celebrex 200 Mg  Caps (Celecoxib) .... Once Daily 2)  Temazepam 30 Mg  Caps (Temazepam) .... Take 1 Tablet By Mouth Once A Day 3)  Triamterene-Hctz 75-50 Mg  Tabs (Triamterene-Hctz) .... Take 1 Tablet By Mouth Once A Day 4)  Vitamin B Complex   Caps (B Complex Vitamins) .... Take 1 Tablet By Mouth Once A Day 5)  Vitamin D3 1000 Unit  Tabs (Cholecalciferol) .[name].. 1 Qd 6)  Benazepril Hcl 20 Mg  Tabs (Benazepril Hcl) .... 2 By Mouth Daily 7)  Amlodipine Besylate 5 Mg  Tabs (Amlodipine Besylate) .[name].. 1 By Mouth Qd 8)  Aspirin 325 Mg Tabs (Aspirin) .... Take 1 Tab By Mouth Every Day 9)  Lopressor 50 Mg Tabs (Metoprolol Tartrate) .... Take 1/4 Tablet Two Times A Day  Allergies (verified): 1)  ! Macrodantin (Nitrofurantoin Macrocrystal) 2)  ! Codeine Sulfate  (Codeine Sulfate) 3)  ! Atenolol (Atenolol)  Past History:  Past Medical History: Paroxysmal Atrial Fibrillation Hypertension Low back pain, spinal stenosis Osteoarthritis Osteopenia Elevated mcv 790.09 Constipation  564.00Iischemic colitis with rectal bleeding  558.9 Borderline low B12  Past Surgical History: Reviewed history from 07/18/2008 and no changes required. Left rotator cuff surgery 1997 Tonsillectomy g  Family History: Reviewed history from 07/18/2008 and no changes required. Family History Hypertension Mother deceased from unknown causes Father deceased from cancer 1 sister, does not know her  Social History: Reviewed history from 07/18/2008 and no changes required. Never Smoked 1 glass of wine per day No illicit drugs Retired [employer] Married, 2 children-ages [phone].   Review of Systems       The patient complains of joint pain.  The patient denies fatigue, malaise, fever, weight gain/loss, chest pain, palpitations, shortness of breath, prolonged cough, coughing up blood, abdominal pain, blood in stool, nausea, vomiting, diarrhea, heartburn, blood in urine, muscle weakness, leg swelling, rash, skin lesions, headache, fainting, dizziness, depression, anxiety, and easy bruising or bleeding.    Vital Signs:  Patient profile:   77 year old female Height:      64 inches Weight:      169 pounds BMI:     29.11 Pulse rate:   67 / minute Pulse rhythm:   regular BP sitting:   136 / 71  (left arm)  Cuff size:   regular  Vitals Entered By: Judithe Modest CMA (August 17, 2008 3:06 PM)  Physical Exam  General:  General: Well developed, well nourished, NAD HEENT: OP clear, mucus membranes moist SKIN: warm, dry Neuro: No focal deficits Musculoskeletal: Muscle strength 5/5 all ext Psychiatric: Mood and affect normal Neck: No JVD, no carotid bruits, no thyromegaly, no lymphadenopathy. Lungs:Clear bilaterally, no wheezes, rhonci, crackles CV: RRR no  murmurs, gallops rubs Abdomen: soft, NT, ND, BS present Extremities: No edema, pulses 2+.    Event Monitor  Procedure date:  08/15/2008  Findings:      Baseline sinus rhythm. Episodes of atrial fibrillation with post termination pauses. PACs.  Impression & Recommendations:  Problem # 1:  ATRIAL FIBRILLATION (ICD-427.31) She is having episodes of atrial fibrillation that resolve spontaneously. She is unaware of these episodes. She has been taking ASA 325 mg once daily and Toprol XL 12.5 mg two times a day. Her CHADS2 score is 2. I have spent 20 minutes today explaining the mechanism of atrial fibrillation as well as the risk of stroke with paroxysmal or permanent atrial fibrillation. She understands this. We discussed initiation of coumadin therapy. She is not sure she wishes to start coumadin therapy at this time. She has been unsteady on her feet because of her hip and leg pain that is felt to be secondary to spinal stenosis and a bulging disc. She is concerned that she may fall and have a bleeding complication if on coumadin. I have asked her to think about initiation of coumadin therapy as this would lower her risk of CVA significantly. She will call our office if she chooses to start therapy.  I will see her back in two months to discuss further. I will continue the current dose of Toprol and ASA.   The following medications were removed from the medication list:    Lopressor 50 Mg Tabs (Metoprolol tartrate) .[name]... Take 1/2 tablet by mouth twice a day Her updated medication list for this problem includes:    Aspirin 325 Mg Tabs (Aspirin) .[name]... Take 1 tab by mouth every day    Toprol Xl 25 Mg Xr24h-tab (Metoprolol succinate) .[name]... Take  half tablet by mouth twice  a day  Patient Instructions: 1)  Your physician recommends that you schedule a follow-up appointment in: 2 months with Dr. Clifton James Prescriptions: TOPROL XL 25 MG XR24H-TAB (METOPROLOL SUCCINATE) Take  half tablet by mouth  twice  a day  #30 x 6   Entered by:   Ollen Gross, RN, BSN   Authorized by:   Verne Carrow, MD   Signed by:   Ollen Gross, RN, BSN on 08/17/2008   Method used:   [facility]        [facility] [address] [phone]* (retail)       [address]       [place], [place]  [address]       Ph: [phone] or [phone]       Fax: [phone]   RxID:   [id number] TOPROL XL 25 MG XR24H-TAB (METOPROLOL SUCCINATE) Take  half tablet by mouth once a day  #30 x 6   Entered by:   Ollen Gross, RN, BSN   Authorized by:   Verne Carrow, MD   Signed by:   Ollen Gross, RN, BSN on 08/17/2008   Method used:   [facility] to        [facility]  [id number] (retail)       [address]       [place], [place]  [address]       Ph: [phone] or [phone]       Fax: [phone]   RxID:   [id number]

## 2010-02-21 NOTE — Assessment & Plan Note (Signed)
Summary: FU/NWS   Vital Signs:  Patient profile:   77 year old female Weight:      177 pounds Temp:     97.7 degrees F oral Pulse rate:   69 / minute BP sitting:   138 / 68  (left arm)  Vitals Entered By: Tora Perches (November 07, 2008 2:45 PM) CC: f/u Is Patient Diabetic? No   Primary Care Provider:  Tresa Garter MD  CC:  f/u.  History of Present Illness: The patient presents for a follow up of hypertension, insomnia, hyperlipidemia.   Preventive Screening-Counseling & Management  Alcohol-Tobacco     Smoking Status: never  Current Medications (verified): 1)  Celebrex 200 Mg  Caps (Celecoxib) .... Once Daily 2)  Temazepam 30 Mg  Caps (Temazepam) .... Take 1 Tablet By Mouth Once A Day 3)  Triamterene-Hctz 75-50 Mg  Tabs (Triamterene-Hctz) .... Take 1 Tablet By Mouth Once A Day 4)  Vitamin B Complex   Caps (B Complex Vitamins) .... Take 1 Tablet By Mouth Once A Day 5)  Vitamin D3 1000 Unit  Tabs (Cholecalciferol) .[name].. 1 Qd 6)  Benazepril Hcl 20 Mg  Tabs (Benazepril Hcl) .... 2 By Mouth Daily 7)  Amlodipine Besylate 5 Mg  Tabs (Amlodipine Besylate) .[name].. 1 By Mouth Qd 8)  Aspirin 81 Mg Tbec (Aspirin) .... Take One Tablet By Mouth Daily 9)  Toprol Xl 25 Mg Xr24h-Tab (Metoprolol Succinate) .... Take 1 Tablet Two Times A Day 10)  Tramadol Hcl 50 Mg  Tabs (Tramadol Hcl) .... As Needed 11)  Warfarin Sodium 5 Mg Tabs (Warfarin Sodium) .... Take 1 Tablet Daily  Allergies: 1)  ! Macrodantin (Nitrofurantoin Macrocrystal) 2)  ! Codeine Sulfate (Codeine Sulfate) 3)  ! Atenolol (Atenolol) 4)  Percocet (Oxycodone-Acetaminophen)  Past History:  Past Medical History: Last updated: 08/17/2008 Paroxysmal Atrial Fibrillation Hypertension Low back pain, spinal stenosis Osteoarthritis Osteopenia Elevated mcv 790.09 Constipation  564.00Iischemic colitis with rectal bleeding  558.9 Borderline low B12  Past Surgical History: Last updated: 07/18/2008 Left rotator cuff  surgery 1997 Tonsillectomy  Family History: Last updated: 07/18/2008 Family History Hypertension Mother deceased from unknown causes Father deceased from cancer 1 sister, does not know her  Social History: Last updated: 07/18/2008 Never Smoked 1 glass of wine per day No illicit drugs Retired [employer] Married, 2 children-ages [phone].   Risk Factors: Smoking Status: never (11/07/2008)  Review of Systems       The patient complains of fever, chest pain, and abdominal pain.    Physical Exam  General:  General: Well developed, well nourished, NAD HEENT: OP clear, mucus membranes moist SKIN: warm, dry Neuro: No focal deficits Musculoskeletal: Muscle strength 5/5 all ext Psychiatric: Mood and affect normal Neck: No JVD, no carotid bruits, no thyromegaly, no lymphadenopathy. Lungs:Clear bilaterally, no wheezes, rhonci, crackles CV: RRR no murmurs, gallops rubs Abdomen: soft, NT, ND, BS present Extremities: No edema, pulses 2+.  Nose:  External nasal examination shows no deformity or inflammation. Nasal mucosa are pink and moist without lesions or exudates. Mouth:  Oral mucosa and oropharynx without lesions or exudates.  Teeth in good repair. Lungs:  Normal respiratory effort, chest expands symmetrically. Lungs are clear to auscultation, no crackles or wheezes. Heart:  Normal rate and regular rhythm. S1 and S2 normal without gallop, murmur, click, rub or other extra sounds. Abdomen:  Bowel sounds positive,abdomen soft and non-tender without masses, organomegaly or hernias noted. Msk:  No deformity or scoliosis noted of thoracic or lumbar  spine.   Extremities:  No clubbing, cyanosis, edema, or deformity noted with normal full range of motion of all joints.   Neurologic:  No cranial nerve deficits noted. Station and gait are normal. Plantar reflexes are down-going bilaterally. DTRs are symmetrical throughout. Sensory, motor and coordinative functions appear intact. Skin:   Intact without suspicious lesions or rashes Psych:  Cognition and judgment appear intact. Alert and cooperative with normal attention span and concentration. No apparent delusions, illusions, hallucinations   Impression & Recommendations:  Problem # 1:  ATRIAL FIBRILLATION (ICD-427.31) Assessment Unchanged  Her updated medication list for this problem includes:    Amlodipine Besylate 5 Mg Tabs (Amlodipine besylate) .[name]... 1 by mouth qd    Aspirin 81 Mg Tbec (Aspirin) .[name]... Take one tablet by mouth daily    Toprol Xl 25 Mg Xr24h-tab (Metoprolol succinate) .[name]... Take 1 tablet two times a day    Warfarin Sodium 5 Mg Tabs (Warfarin sodium) .[name]... Take 1 tablet daily  Problem # 2:  FATIGUE (ICD-780.79) Assessment: Deteriorated  Problem # 3:  INSOMNIA, PERSISTENT (ICD-307.42) Assessment: Comment Only We finally refilled her Rx ( was out x 2-3d)  Problem # 4:  LOW BACK PAIN (ICD-724.2) Assessment: Deteriorated See "Patient Instructions".  Ortho repeat consult is pending  Her updated medication list for this problem includes:    Celebrex 200 Mg Caps (Celecoxib) ..... Once daily    Aspirin 81 Mg Tbec (Aspirin) .[name]... Take one tablet by mouth daily    Tramadol Hcl 50 Mg Tabs (Tramadol hcl) .[name]... As needed  Complete Medication List: 1)  Celebrex 200 Mg Caps (Celecoxib) .... Once daily 2)  Temazepam 30 Mg Caps (Temazepam) .... Take 1 tablet by mouth once a day 3)  Triamterene-hctz 75-50 Mg Tabs (Triamterene-hctz) .... Take 1 tablet by mouth once a day 4)  Vitamin B Complex Caps (B complex vitamins) .... Take 1 tablet by mouth once a day 5)  Vitamin D3 1000 Unit Tabs (Cholecalciferol) .[name].. 1 qd 6)  Benazepril Hcl 20 Mg Tabs (Benazepril hcl) .... 2 by mouth daily 7)  Amlodipine Besylate 5 Mg Tabs (Amlodipine besylate) .[name].. 1 by mouth qd 8)  Aspirin 81 Mg Tbec (Aspirin) .... Take one tablet by mouth daily 9)  Toprol Xl 25 Mg Xr24h-tab (Metoprolol succinate) .... Take 1 tablet two times a day  10)  Tramadol Hcl 50 Mg Tabs (Tramadol hcl) .... As needed 11)  Warfarin Sodium 5 Mg Tabs (Warfarin sodium) .... Take 1 tablet daily  Patient Instructions: 1)  Please schedule a follow-up appointment in 3 months. 2)  BMP prior to visit, ICD-9: 3)  Hepatic Panel prior to visit, ICD-9: 4)  Lipid Panel prior to visit, ICD-9: 5)  TSH prior to visit, ICD-9: 6)  CBC w/ Diff prior to visit, ICD-9: 7)  B12 782.0  995.20

## 2010-02-21 NOTE — Miscellaneous (Signed)
Summary: chart update  Clinical Lists Changes  Echocardiogram  Procedure date:  07/20/2008  Findings:       Study Conclusions    1. Left ventricle: The cavity size was normal. Wall thickness was      increased in a pattern of mild LVH. Systolic function was normal.      The estimated ejection fraction was in the range of 60% to 65%.   2. Mitral valve: Mild regurgitation.   Echocardiography. M-mode, complete 2D, spectral Doppler, and color   Doppler. Height: Height: 162.6cm. Height: 64in. Weight: Weight:   80kg. Weight: 176lb. Body mass index: BMI: 30.3kg/m^2. Body surface   area: BSA: 1.66m^2. Patient status: Inpatient. Location: Scientist, physiological.

## 2010-02-21 NOTE — Assessment & Plan Note (Signed)
Summary: per stacie doublebook-for an 11a appt-nausea -constipation stc   Vital Signs:  Patient profile:   77 year old female Height:      64 inches Weight:      167 pounds BMI:     28.77 Temp:     98.3 degrees F oral Pulse rate:   88 / minute Pulse rhythm:   irregular Resp:     16 per minute BP sitting:   110 / 60  (left arm) Cuff size:   regular  Vitals Entered By: Lanier Prude, CMA(AAMA) (October 25, 2009 11:19 AM) CC: Nausea, black stools, constipation, decreased appetite X 1 wk Is Patient Diabetic? No   Primary Care Provider:  Tresa Garter MD  CC:  Nausea, black stools, constipation, and decreased appetite X 1 wk.  History of Present Illness: C/o feeling sick, constipated, sweaty, hot x 1  week She had black stool all week last time yesterday LBP - bad   Current Medications (verified): 1)  Temazepam 30 Mg  Caps (Temazepam) .... Take 1 Tablet By Mouth Once A Day 2)  Triamterene-Hctz 75-50 Mg  Tabs (Triamterene-Hctz) .... Take 1 Tablet By Mouth Once A Day 3)  Benazepril Hcl 20 Mg  Tabs (Benazepril Hcl) .[name].. 1-2 By Mouth Daily 4)  Amlodipine Besylate 5 Mg  Tabs (Amlodipine Besylate) .... 1/2 By Mouth Once Daily 5)  Metoprolol Succinate 50 Mg Xr24h-Tab (Metoprolol Succinate) .[name].. 1 Tab Two Times A Day 6)  Furosemide 40 Mg Tabs (Furosemide) .[name].. 1 By Mouth Every Other Day As Needed Swelling 7)  Vitamin D3 1000 Unit  Tabs (Cholecalciferol) .[name].. 1 Qd 8)  Aspirin 81 Mg Tbec (Aspirin) .... Take One Tablet By Mouth Daily 9)  Vitamin B Complex   Caps (B Complex Vitamins) .... Take 1 Tablet By Mouth Once A Day 10)  Warfarin Sodium 5 Mg Tabs (Warfarin Sodium) .... Use As Directed By Anticoagulation Clinic 11)  Calcium Carbonate-Vitamin D 600-400 Mg-Unit  Tabs (Calcium Carbonate-Vitamin D) .... Take 1 Tablet By Mouth Once A Day 12)  Prednisone 10 Mg Tabs (Prednisone) .... Take 40mg  Qd For 3 Days, Then 20 Mg Qd For 3 Days, Then 10mg  Qd For 6 Days, Then Stop. Take  Pc.  Allergies (verified): 1)  ! Macrodantin (Nitrofurantoin Macrocrystal) 2)  ! Codeine Sulfate (Codeine Sulfate) 3)  ! Atenolol (Atenolol) 4)  Percocet (Oxycodone-Acetaminophen)  Past History:  Past Surgical History: Last updated: 02/12/2009 Left rotator cuff surgery 1997 Tonsillectomy Back surgery-12/10 L3/L4/L5 fusion  Family History: Last updated: 07/18/2008 Family History Hypertension Mother deceased from unknown causes Father deceased from cancer 1 sister, does not know her  Social History: Last updated: 07/18/2008 Never Smoked 1 glass of wine per day No illicit drugs Retired [employer] Married, 2 children-ages [phone].   Past Medical History: Paroxysmal Atrial Fibrillation on coumadin therapy Hypertension Low back pain, severe lumbar spondylosis s/p L3/L4, L4/L5 fusion 12/10. Osteoarthritis Osteopenia Elevated mcv 790.09 Constipation  564.00Iischemic colitis with rectal bleeding  558.9 Borderline low B12 Renal insufficiency Hypothyroidism 2011 FH reviewed for relevance  Review of Systems       The patient complains of anorexia, dyspnea on exertion, melena, muscle weakness, and difficulty walking.  The patient denies fever, weight loss, weight gain, vision loss, decreased hearing, hoarseness, chest pain, syncope, peripheral edema, prolonged cough, headaches, hemoptysis, abdominal pain, hematochezia, severe indigestion/heartburn, hematuria, incontinence, genital sores, suspicious skin lesions, transient blindness, depression, unusual weight change, abnormal bleeding, enlarged lymph nodes, and angioedema.  Physical Exam  General:   ill-appearing , pale Head:  Normocephalic and atraumatic without obvious abnormalities. No apparent alopecia or balding. Eyes:  No corneal or conjunctival inflammation noted. EOMI. Perrla. Nose:  External nasal examination shows no deformity or inflammation. Nasal mucosa are pink and moist without lesions or  exudates. Mouth:  Not dry Neck:  supple Lungs:  CTA Heart:  Irreg irreg Abdomen:  Bowel sounds positive,abdomen soft and non-tender without masses, organomegaly or hernias noted. Rectal:  no external abnormalities, no hemorrhoids, normal sphincter tone, and no masses.  G(+++) stool, brown Msk:  Using a cane Lumbar-sacral spine is tender to palpation over paraspinal muscles and painfull with the ROM  Extremities:  no edema Neurologic:  alert & oriented X3.   Skin:  no cellulitis Psych:  Oriented X3.     Impression & Recommendations:  Problem # 1:  MELENA (ICD-578.1) likely NSAIDs induced ulcer Assessment New Start Dexilant, given 2 now Stop ASA, NSAIDs Orders: TLB-BMP (Basic Metabolic Panel-BMET) (80048-METABOL) TLB-BNP (B-Natriuretic Peptide) (83880-BNPR) TLB-CBC Platelet - w/Differential (85025-CBCD) TLB-Hepatic/Liver Function Pnl (80076-HEPATIC) TLB-TSH (Thyroid Stimulating Hormone) (84443-TSH) TLB-Udip ONLY (81003-UDIP) Protime INR (04540) Gastroenterology Referral (GI) asap  Problem # 2:  SPINAL STENOSIS (ICD-724.00) Assessment: Unchanged Tramadol prn No Aleve  Orders: TLB-BMP (Basic Metabolic Panel-BMET) (80048-METABOL) TLB-BNP (B-Natriuretic Peptide) (83880-BNPR) TLB-CBC Platelet - w/Differential (85025-CBCD) TLB-Hepatic/Liver Function Pnl (80076-HEPATIC) TLB-TSH (Thyroid Stimulating Hormone) (84443-TSH) TLB-Udip ONLY (81003-UDIP)  Problem # 3:  ATRIAL FIBRILLATION (ICD-427.31) Assessment: Unchanged  Her updated medication list for this problem includes:    Amlodipine Besylate 5 Mg Tabs (Amlodipine besylate) .[name]... 1/2 by mouth once daily    Metoprolol Succinate 50 Mg Xr24h-tab (Metoprolol succinate) .[name]... 1 tab two times a day    Aspirin 81 Mg Tbec (Aspirin) .[name]... Take one tablet by mouth daily    Warfarin Sodium 5 Mg Tabs (Warfarin sodium) ..... Use as directed by anticoagulation clinic  Orders: TLB-BMP (Basic Metabolic Panel-BMET) (80048-METABOL) TLB-BNP  (B-Natriuretic Peptide) (83880-BNPR) TLB-CBC Platelet - w/Differential (85025-CBCD) TLB-Hepatic/Liver Function Pnl (80076-HEPATIC) TLB-TSH (Thyroid Stimulating Hormone) (84443-TSH) TLB-Udip ONLY (81003-UDIP)  Problem # 4:  LOW BACK PAIN (ICD-724.2) Assessment: Unchanged  Her updated medication list for this problem includes:    Aspirin 81 Mg Tbec (Aspirin) .[name]... Take one tablet by mouth daily    Tramadol Hcl 50 Mg Tabs (Tramadol hcl) .[name]... 1-2 tabs by mouth two times a day as needed pain  Orders: TLB-BMP (Basic Metabolic Panel-BMET) (80048-METABOL) TLB-BNP (B-Natriuretic Peptide) (83880-BNPR) TLB-CBC Platelet - w/Differential (85025-CBCD) TLB-Hepatic/Liver Function Pnl (80076-HEPATIC) TLB-TSH (Thyroid Stimulating Hormone) (84443-TSH) TLB-Udip ONLY (81003-UDIP)  Problem # 5:  HYPOTHYROIDISM (ICD-244.9) Assessment: New  Her updated medication list for this problem includes:    Synthroid 25 Mcg Tabs (Levothyroxine sodium) .[name]... 1 by mouth once daily for thyroid  Complete Medication List: 1)  Temazepam 30 Mg Caps (Temazepam) .... Take 1 tablet by mouth once a day 2)  Triamterene-hctz 75-50 Mg Tabs (Triamterene-hctz) .... Take 1 tablet by mouth once a day 3)  Benazepril Hcl 20 Mg Tabs (Benazepril hcl) .[name].. 1-2 by mouth daily 4)  Amlodipine Besylate 5 Mg Tabs (Amlodipine besylate) .... 1/2 by mouth once daily 5)  Metoprolol Succinate 50 Mg Xr24h-tab (Metoprolol succinate) .[name].. 1 tab two times a day 6)  Furosemide 40 Mg Tabs (Furosemide) .[name].. 1 by mouth every other day as needed swelling 7)  Vitamin D3 1000 Unit Tabs (Cholecalciferol) .[name].. 1 qd 8)  Aspirin 81 Mg Tbec (Aspirin) .... Take one tablet by mouth daily 9)  Vitamin B Complex Caps (B complex vitamins) .... Take 1 tablet by mouth once a day 10)  Warfarin Sodium 5 Mg Tabs (Warfarin sodium) .... Use as directed by anticoagulation clinic 11)  Calcium Carbonate-vitamin D 600-400 Mg-unit Tabs (Calcium carbonate-vitamin d) .... Take 1  tablet by mouth once a day 12)  Tramadol Hcl 50 Mg Tabs (Tramadol hcl) .[name].. 1-2 tabs by mouth two times a day as needed pain 13)  Synthroid 25 Mcg Tabs (Levothyroxine sodium) .[name].. 1 by mouth once daily for thyroid  Patient Instructions: 1)  Call if you are not better in a reasonable amount of time or if worse. Go to ER if feeling really bad!  2)  Do not take aspirin, aleve 3)  Dexilant 1 twice a day 4)  Please schedule a follow-up appointment in 4 days Prescriptions: SYNTHROID 25 MCG TABS (LEVOTHYROXINE SODIUM) 1 by mouth once daily for thyroid  #90 x 3   Entered and Authorized by:   Tresa Garter MD   Signed by:   Tresa Garter MD on 10/25/2009   Method used:   [facility]ronically to        [facility] [address] [phone]* (retail)       [address]       [place], [place]  [address]       Ph: [phone] or [phone]       Fax: [phone]   RxID:   [id number] TRAMADOL HCL 50 MG TABS (TRAMADOL HCL) 1-2 tabs by mouth two times a day as needed pain  #60 x 3   Entered and Authorized by:   Tresa Garter MD   Signed by:   Tresa Garter MD on 10/25/2009   Method used:   Electronically to        [facility] [address] [phone]* (retail)       [address]       [place], [place]  [address]       Ph: [phone] or [phone]       Fax: [phone]   RxID:   [id number]

## 2010-02-26 ENCOUNTER — Ambulatory Visit (INDEPENDENT_AMBULATORY_CARE_PROVIDER_SITE_OTHER): Payer: Medicare Other | Admitting: Internal Medicine

## 2010-02-26 ENCOUNTER — Encounter: Payer: Self-pay | Admitting: Internal Medicine

## 2010-02-26 DIAGNOSIS — M545 Low back pain, unspecified: Secondary | ICD-10-CM

## 2010-02-26 DIAGNOSIS — R109 Unspecified abdominal pain: Secondary | ICD-10-CM

## 2010-02-26 DIAGNOSIS — M25559 Pain in unspecified hip: Secondary | ICD-10-CM

## 2010-02-26 DIAGNOSIS — I4891 Unspecified atrial fibrillation: Secondary | ICD-10-CM

## 2010-02-27 ENCOUNTER — Ambulatory Visit: Payer: Medicare Other | Admitting: Rehabilitation

## 2010-02-27 NOTE — Letter (Signed)
Summary: EGD Instructions  [facility]  [address] [place], [place] [address]   Phone: [phone]  Fax: [phone]       [name]    06/12/1925    MRN: [id number]       Procedure Day Dorna Bloom:  Farrell Ours 03/29/10     Arrival Time: 10:00am     Procedure Time: 11:00am     Location of Procedure:                    _X_ [facility] (4th Floor)    PREPARATION FOR ENDOSCOPY   On FRIDAY, 03/29/10  THE DAY OF THE PROCEDURE:  1.   No solid foods, milk or milk products are allowed after midnight the night before your procedure.  2.   Do not drink anything colored red or purple.  Avoid juices with pulp.  No orange juice.  3.  You may drink clear liquids until 9:00am , which is 2 hours before your procedure.                                                                                                CLEAR LIQUIDS INCLUDE: Water Jello Ice Popsicles Tea (sugar ok, no milk/cream) Powdered fruit flavored drinks Coffee (sugar ok, no milk/cream) Gatorade Juice: apple, white grape, white cranberry  Lemonade Clear bullion, consomm, broth Carbonated beverages (any kind) Strained chicken noodle soup Hard Candy   MEDICATION INSTRUCTIONS  Unless otherwise instructed, you should take regular prescription medications with a small sip of water as early as possible the morning of your procedure.    Stay on Coumadin for procedure.  Stop Iron 2-3 days before procedure.               OTHER INSTRUCTIONS  You will need a responsible adult at least 75 years of age to accompany you and drive you home.   This person must remain in the waiting room during your procedure.  Wear loose fitting clothing that is easily removed.  Leave jewelry and other valuables at home.  However, you may wish to bring a book to read or an iPod/MP3 player to listen to music as you wait for your procedure to start.  Remove all body piercing jewelry and leave at  home.  Total time from sign-in until discharge is approximately 2-3 hours.  You should go home directly after your procedure and rest.  You can resume normal activities the day after your procedure.  The day of your procedure you should not:   Drive   Make legal decisions   Operate machinery   Drink alcohol   Return to work  You will receive specific instructions about eating, activities and medications before you leave.    The above instructions have been reviewed and explained to me by   Ezra Sites RN  February 20, 2010 4:32 PM   I fully understand and can verbalize these instructions _____________________________ Date _________

## 2010-02-27 NOTE — Miscellaneous (Signed)
Summary: [facility] PV  Clinical Lists Changes  Observations: Added new observation of ALLERGY REV: Done (02/20/2010 16:29)

## 2010-02-27 NOTE — Miscellaneous (Signed)
Summary: PT Eval/[facility]  PT Eval/   Imported By: Sherian Rein 02/20/2010 11:53:11  _____________________________________________________________________  External Attachment:    Type:   Image     Comment:   External Document

## 2010-02-28 ENCOUNTER — Ambulatory Visit: Payer: Medicare Other | Admitting: Rehabilitation

## 2010-03-05 ENCOUNTER — Ambulatory Visit: Payer: Medicare Other | Admitting: Rehabilitation

## 2010-03-06 ENCOUNTER — Ambulatory Visit: Payer: Medicare Other | Admitting: Rehabilitation

## 2010-03-06 ENCOUNTER — Telehealth (INDEPENDENT_AMBULATORY_CARE_PROVIDER_SITE_OTHER): Payer: Self-pay | Admitting: *Deleted

## 2010-03-07 ENCOUNTER — Encounter: Payer: Self-pay | Admitting: Internal Medicine

## 2010-03-07 ENCOUNTER — Encounter (INDEPENDENT_AMBULATORY_CARE_PROVIDER_SITE_OTHER): Payer: Medicare Other

## 2010-03-07 DIAGNOSIS — I4891 Unspecified atrial fibrillation: Secondary | ICD-10-CM

## 2010-03-07 DIAGNOSIS — Z7901 Long term (current) use of anticoagulants: Secondary | ICD-10-CM

## 2010-03-07 LAB — CONVERTED CEMR LAB: POC INR: 2.9

## 2010-03-07 NOTE — Assessment & Plan Note (Signed)
Summary: 2 MON ROA/NWS   Vital Signs:  Patient profile:   77 year old female Weight:      179 pounds BMI:     30.84 O2 Sat:      98 % Temp:     97.5 degrees F Pulse rate:   79 / minute BP sitting:   142 / 80  (left arm) Cuff size:   regular  Vitals Entered By: Lamar Sprinkles, CMA (February 26, 2010 1:29 PM) CC: f/u/SD   Primary Care Provider:  Tresa Garter MD  CC:  f/u/SD.  History of Present Illness: C/o LBP and R groin 5/10. Better w/meds. Worse with sitting, walking. F/u HTN, anemia, PUD.  Current Medications (verified): 1)  Temazepam 30 Mg  Caps (Temazepam) .... Take 1 Tablet By Mouth Once A Day 2)  Furosemide 40 Mg Tabs (Furosemide) .[name].. 1 By Mouth Every Other Day As Needed Swelling 3)  Vitamin D3 1000 Unit  Tabs (Cholecalciferol) .[name].. 1 Qd 4)  Vitamin B Complex   Caps (B Complex Vitamins) .... Take 1 Tablet By Mouth Once A Day 5)  Synthroid 25 Mcg Tabs (Levothyroxine Sodium) .[name].. 1 By Mouth Once Daily For Thyroid 6)  Cardizem Cd 120 Mg Xr24h-Cap (Diltiazem Hcl Coated Beads) .[name].. 1 Cap Once Daily 7)  Metoprolol Succinate 50 Mg Xr24h-Tab (Metoprolol Succinate) .... Take One Tablet By Mouth Two Times A Day. 8)  Protonix 40 Mg Tbec (Pantoprazole Sodium) .[name].. 1 Tab Once Daily 9)  Warfarin Sodium 5 Mg Tabs (Warfarin Sodium) .... As Directed 10)  Hydroxyzine Hcl 25 Mg Tabs (Hydroxyzine Hcl) .[name].. 1-2 Tabs By Mouth Two Times A Day As Needed Itching 11)  Ferrex 150 Forte 150-25-1 Mg-Mcg-Mg Caps (Iron Polysacch Cmplx-B12-Fa) .[name].. 1 By Mouth Two Times A Day  Take With Vit C 250 Mg Two Times A Day 12)  Loratadine 10 Mg Tabs (Loratadine) .[name].. 1 By Mouth Once Daily As Needed Allergies 13)  Wheelchair .... As Dirr 14)  Tramadol Hcl 50 Mg Tabs (Tramadol Hcl) .[name].. 1-2 Tabs By Mouth Two Times A Day As Needed Pain  Allergies (verified): 1)  ! Macrodantin (Nitrofurantoin Macrocrystal) 2)  ! Codeine Sulfate (Codeine Sulfate) 3)  Atenolol (Atenolol) 4)  Percocet  (Oxycodone-Acetaminophen) 5)  Biaxin  Past History:  Past Medical History: Last updated: 11/22/2009 Paroxysmal Atrial Fibrillation on coumadin therapy Hypertension Low back pain, severe lumbar spondylosis s/p L3/L4, L4/L5 fusion 12/10. Osteoarthritis Dr Juliene Pina Osteopenia Elevated mcv 790.09 Constipation  564.00Iischemic colitis with rectal bleeding  558.9 Borderline low B12 Renal insufficiency Hypothyroidism 2011  NSAID gastropathy w/bleed 2011  Anemia-iron deficiency 2011and elev. MCV Peptic ulcer disease  Social History: Last updated: 10/26/2009 Never Smoked 1 glass of wine per day No illicit drugs Retired [employer] Married, 2 children-ages [phone].     Physical Exam  General:  Not ill-appearing , not pale, NAD Head:  Normocephalic and atraumatic without obvious abnormalities. No apparent alopecia or balding. Nose:  External nasal examination shows no deformity or inflammation. Nasal mucosa are pink and moist without lesions or exudates. Mouth:  Not dry Neck:  supple Lungs:  CTA Heart:  Irreg irreg Abdomen:  Bowel sounds positive,abdomen soft and non-tender without masses, organomegaly or hernias noted. Msk:  Using a walker R groin is tender R troch bursa is tender Lumbar-sacral spine is tender to palpation over paraspinal muscles and painfull with the ROM  Extremities:  no edema Neurologic:  alert & oriented X3.   Skin:  No eryth patchy  rash on back Psych:  Oriented X3.  Not depressed or anxious   Impression & Recommendations:  Problem # 1:  SPINAL STENOSIS (ICD-724.00) Assessment Comment Only see below  Problem # 2:  LOW BACK PAIN (ICD-724.2) Assessment: Unchanged Dr Channing Mutters left for [facility] Her updated medication list for this problem includes:    Tramadol Hcl 50 Mg Tabs (Tramadol hcl) .[name]... 1-2 tabs by mouth two times a day as needed pain    Cyclobenzaprine Hcl 5 Mg Tabs (Cyclobenzaprine hcl) .[name]... 1 by mouth two times a day as needed spasm -- OK to  take up to  Orders: Neurosurgeon Referral (Neurosurgeon) Dr Danielle Dess  ?? injections  Problem # 3:  GROIN PAIN (ICD-789.09) post -op ? etiol Assessment: New  Her updated medication list for this problem includes:    Tramadol Hcl 50 Mg Tabs (Tramadol hcl) .[name]... 1-2 tabs by mouth two times a day as needed pain    Cyclobenzaprine Hcl 5 Mg Tabs (Cyclobenzaprine hcl) .[name]... 1 by mouth two times a day as needed spasm  Problem # 4:  ANEMIA-IRON DEFICIENCY (ICD-280.9) Assessment: Improved  Her updated medication list for this problem includes:    Ferrex 150 Forte 150-25-1 Mg-mcg-mg Caps (Iron polysacch cmplx-b12-fa) .[name]... 1 by mouth two times a day  take with vit c 250 mg two times a day  Problem # 5:  HYPERTENSION (ICD-401.9) Assessment: Improved  Her updated medication list for this problem includes:    Furosemide 40 Mg Tabs (Furosemide) .[name]... 1 by mouth every other day as needed swelling    Cardizem Cd 120 Mg Xr24h-cap (Diltiazem hcl coated beads) .[name]... 1 cap once daily    Metoprolol Succinate 50 Mg Xr24h-tab (Metoprolol succinate) .[name]... Take one tablet by mouth two times a day.  Orders: Neurosurgeon Referral (Neurosurgeon)  BP today: 142/80 Prior BP: 116/68 (12/24/2009)  Labs Reviewed: K+: 3.8 (02/20/2010) Creat: : 1.0 (02/20/2010)   Chol: 153 (02/06/2009)   HDL: 54.10 (02/06/2009)   LDL: 87 (02/06/2009)   TG: 60.0 (02/06/2009)  Complete Medication List: 1)  Temazepam 30 Mg Caps (Temazepam) .... Take 1 tablet by mouth once a day 2)  Furosemide 40 Mg Tabs (Furosemide) .[name].. 1 by mouth every other day as needed swelling 3)  Vitamin D3 1000 Unit Tabs (Cholecalciferol) .[name].. 1 qd 4)  Vitamin B Complex Caps (B complex vitamins) .... Take 1 tablet by mouth once a day 5)  Synthroid 25 Mcg Tabs (Levothyroxine sodium) .[name].. 1 by mouth once daily for thyroid 6)  Cardizem Cd 120 Mg Xr24h-cap (Diltiazem hcl coated beads) .[name].. 1 cap once daily 7)  Metoprolol Succinate 50 Mg Xr24h-tab (Metoprolol  succinate) .... Take one tablet by mouth two times a day. 8)  Protonix 40 Mg Tbec (Pantoprazole sodium) .[name].. 1 tab once daily 9)  Warfarin Sodium 5 Mg Tabs (Warfarin sodium) .... As directed 10)  Hydroxyzine Hcl 25 Mg Tabs (Hydroxyzine hcl) .[name].. 1-2 tabs by mouth two times a day as needed itching 11)  Ferrex 150 Forte 150-25-1 Mg-mcg-mg Caps (Iron polysacch cmplx-b12-fa) .[name].. 1 by mouth two times a day  take with vit c 250 mg two times a day 12)  Loratadine 10 Mg Tabs (Loratadine) .[name].. 1 by mouth once daily as needed allergies 13)  Wheelchair  .... As dirr 14)  Tramadol Hcl 50 Mg Tabs (Tramadol hcl) .[name].. 1-2 tabs by mouth two times a day as needed pain 15)  Cyclobenzaprine Hcl 5 Mg Tabs (Cyclobenzaprine hcl) .[name].. 1 by mouth two times a  day as needed spasm  Patient Instructions: 1)  Please schedule a follow-up appointment in 6 weeks. Prescriptions: CYCLOBENZAPRINE HCL 5 MG TABS (CYCLOBENZAPRINE HCL) 1 by mouth two times a day as needed spasm  #60 x 3   Entered and Authorized by:   Tresa Garter MD   Signed by:   Tresa Garter MD on 02/26/2010   Method used:   Electronically to        [facility] [phone]* (retail)       [address]       [place], [place]  [address]       Ph: [phone] or [phone]       Fax: [phone]   RxID:   [id number]    Orders Added: 1)  Neurosurgeon Referral [Neurosurgeon] 2)  Est. Patient Level IV [34742]

## 2010-03-11 ENCOUNTER — Ambulatory Visit: Payer: Medicare Other | Admitting: Physical Therapy

## 2010-03-12 DIAGNOSIS — Z7901 Long term (current) use of anticoagulants: Secondary | ICD-10-CM

## 2010-03-12 DIAGNOSIS — I4891 Unspecified atrial fibrillation: Secondary | ICD-10-CM

## 2010-03-12 DIAGNOSIS — I4892 Unspecified atrial flutter: Secondary | ICD-10-CM

## 2010-03-13 ENCOUNTER — Ambulatory Visit: Payer: Medicare Other | Admitting: Rehabilitation

## 2010-03-13 ENCOUNTER — Telehealth: Payer: Self-pay | Admitting: Internal Medicine

## 2010-03-13 NOTE — Medication Information (Signed)
Summary: ccr/t,j  Anticoagulant Therapy  Managed by: Weston Brass, PharmD Referring MD: Verne Carrow PCP: Tresa Garter MD Supervising MD: Tenny Craw MD, Gunnar Fusi Indication 1: Atrial Fibrillation Lab Used: [facility] [place] Site: [address] INR POC 2.9 INR RANGE 2.0-3.0  Dietary changes: no    Health status changes: no    Bleeding/hemorrhagic complications: no    Recent/future hospitalizations: yes       Details: Endoscopy scheduled for 03/29/2010 - instructions from [facility] say NOT to stop coumadin for procedure.  Any changes in medication regimen? no    Recent/future dental: no  Any missed doses?: no       Is patient compliant with meds? yes       Allergies: 1)  ! Macrodantin (Nitrofurantoin Macrocrystal) 2)  ! Codeine Sulfate (Codeine Sulfate) 3)  Atenolol (Atenolol) 4)  Percocet (Oxycodone-Acetaminophen) 5)  Biaxin  Anticoagulation Management History:      The patient is taking warfarin and comes in today for a routine follow up visit.  Positive risk factors for bleeding include an age of 75 years or older, history of GI bleeding, and presence of serious comorbidities.  The bleeding index is 'high risk'.  Positive CHADS2 values include History of HTN and Age > 80 years old.  Her last INR was 1.6 ratio.  Anticoagulation responsible provider: Tenny Craw MD, Gunnar Fusi.  INR POC: 2.9.  Cuvette Lot#: [id number].  Exp: 01/2011.    Anticoagulation Management Assessment/Plan:      The patient's current anticoagulation dose is Warfarin sodium 5 mg tabs: as directed.  The target INR is 2.0-3.0.  The next INR is due 04/04/2010.  Anticoagulation instructions were given to patient.  Results were reviewed/authorized by Weston Brass, PharmD.  She was notified by Margot Chimes PharmD Candidate.         Prior Anticoagulation Instructions: INR 2.6  Continue on same dosage 1/2 tablets daily except 1 tablet on Mondays, Wednesdays and Fridays.  Recheck in 4  weeks.    Current Anticoagulation Instructions: INR 2.9  Continue your current dose of 1/2 tablet everyday except on Mondays, Wednesdays, and Fridays when you take 1 tablet.  Recheck INR in 4 weeks.

## 2010-03-14 ENCOUNTER — Ambulatory Visit: Payer: Medicare Other | Admitting: Rehabilitation

## 2010-03-19 ENCOUNTER — Ambulatory Visit: Payer: Medicare Other | Admitting: Rehabilitation

## 2010-03-19 NOTE — Progress Notes (Signed)
Summary: RESULTS  Phone Note Call from Patient   Caller: Pam  Summary of Call: Pt wants to know if she had xray of right hip, poss 10/2009?  Initial call taken by: Lamar Sprinkles, CMA,  March 13, 2010 12:38 PM  Follow-up for Phone Call        Informed pt that she did have right hip xray in oct. I also mailed results of the xray per pts request Follow-up by: Ami Bullins CMA,  March 15, 2010 8:55 AM

## 2010-03-21 ENCOUNTER — Ambulatory Visit: Payer: Medicare Other | Attending: Internal Medicine | Admitting: Rehabilitation

## 2010-03-21 DIAGNOSIS — M2550 Pain in unspecified joint: Secondary | ICD-10-CM | POA: Insufficient documentation

## 2010-03-21 DIAGNOSIS — IMO0001 Reserved for inherently not codable concepts without codable children: Secondary | ICD-10-CM | POA: Insufficient documentation

## 2010-03-21 DIAGNOSIS — R269 Unspecified abnormalities of gait and mobility: Secondary | ICD-10-CM | POA: Insufficient documentation

## 2010-03-21 DIAGNOSIS — R262 Difficulty in walking, not elsewhere classified: Secondary | ICD-10-CM | POA: Insufficient documentation

## 2010-03-21 DIAGNOSIS — M6281 Muscle weakness (generalized): Secondary | ICD-10-CM | POA: Insufficient documentation

## 2010-03-26 ENCOUNTER — Ambulatory Visit: Payer: Medicare Other | Admitting: Rehabilitation

## 2010-03-27 ENCOUNTER — Ambulatory Visit: Payer: Medicare Other | Admitting: Rehabilitation

## 2010-03-28 ENCOUNTER — Ambulatory Visit: Payer: Medicare Other | Admitting: Rehabilitation

## 2010-03-28 ENCOUNTER — Telehealth: Payer: Self-pay | Admitting: Internal Medicine

## 2010-03-29 ENCOUNTER — Encounter: Payer: Self-pay | Admitting: Gastroenterology

## 2010-03-29 ENCOUNTER — Other Ambulatory Visit (AMBULATORY_SURGERY_CENTER): Payer: Medicare Other | Admitting: Gastroenterology

## 2010-03-29 DIAGNOSIS — K449 Diaphragmatic hernia without obstruction or gangrene: Secondary | ICD-10-CM

## 2010-03-29 DIAGNOSIS — A048 Other specified bacterial intestinal infections: Secondary | ICD-10-CM

## 2010-03-29 DIAGNOSIS — K259 Gastric ulcer, unspecified as acute or chronic, without hemorrhage or perforation: Secondary | ICD-10-CM

## 2010-03-29 HISTORY — PX: UPPER GI ENDOSCOPY: SHX6162

## 2010-04-02 ENCOUNTER — Ambulatory Visit: Payer: Medicare Other | Admitting: Rehabilitation

## 2010-04-02 NOTE — Procedures (Signed)
Summary: Upper Endoscopy  Patient: [name] Note: All result statuses are Final unless otherwise noted.  Tests: (1) Upper Endoscopy (EGD)   EGD Upper Endoscopy       DONE     [facility]     [address]     [place], [place]  [address]          ENDOSCOPY PROCEDURE REPORT          PATIENT:  [name], [name]  MR#:  [id number]     BIRTHDATE:  Jun 06, 1925, 84 yrs. old  GENDER:  female     ENDOSCOPIST:  Rachael Fee, MD     PROCEDURE DATE:  03/29/2010     PROCEDURE:  EGD, diagnostic [phone]     ASA CLASS:  Class II     INDICATIONS:  H. pylori + gastric ulcers, presented with melena     and anemia 10/2009     MEDICATIONS:   Fentanyl 50 mcg IV, Versed 5 mg IV     TOPICAL ANESTHETIC:  Exactacain Spray          DESCRIPTION OF PROCEDURE:   After the risks benefits and     alternatives of the procedure were thoroughly explained, informed     consent was obtained.  The [facility] GIF-H180 [id number] endoscope was     introduced through the mouth and advanced to the second portion of     the duodenum, without limitations.  The instrument was slowly     withdrawn as the mucosa was fully examined.     <<PROCEDUREIMAGES>>     A hiatal hernia was found (see image5).  Otherwise the examination     was normal (see image4, image3, image2, and image1).   Retroflexed     views revealed no abnormalities.    The scope was then withdrawn     from the patient and the procedure completed.     COMPLICATIONS:  None     ENDOSCOPIC IMPRESSION:     1) Hiatal hernia     2) Otherwise normal examination; the previously noted gastritis     and gastric ulcers have healed          RECOMMENDATIONS:     Follow up with GI as needed.     Can switch dosing of protonix to only AS NEEDED.          ______________________________     Rachael Fee, MD          n.     eSIGNED:   Rachael Fee at 03/29/2010 10:48 AM          Denner, Windell Moulding, [phone]  Note: An exclamation mark (!) indicates a result  that was not dispersed into the flowsheet. Document Creation Date: 03/29/2010 10:48 AM _______________________________________________________________________  (1) Order result status: Final Collection or observation date-time: 03/29/2010 10:42 Requested date-time:  Receipt date-time:  Reported date-time:  Referring Physician:   Ordering Physician: Rob Bunting [phone]) Specimen Source:  Source: Launa Grill Order Number: [id number] Lab site:

## 2010-04-02 NOTE — Progress Notes (Signed)
Summary: TENS UNIT ORDER  Phone Note From Other Clinic   Caller: Donna/Physical therapist Phone [phone] Fax [phone] Summary of Call: Outpt PT is req rx for TENS units from MD.  Initial call taken by: Lamar Sprinkles, CMA,  March 28, 2010 3:26 PM  Follow-up for Phone Call        ok  Follow-up by: Tresa Garter MD,  March 28, 2010 6:01 PM  Additional Follow-up for Phone Call Additional follow up Details #1::        To be faxed tomorrow Additional Follow-up by: Lamar Sprinkles, CMA,  March 28, 2010 6:30 PM    New/Updated Medications: * TENS UNIT as dirr. by PT Dx LBP Prescriptions: TENS UNIT as dirr. by PT Dx LBP  #1 x 0   Entered and Authorized by:   Tresa Garter MD   Signed by:   Lamar Sprinkles, CMA on 03/28/2010   Method used:   Print then Give to Patient   RxID:   [id number]

## 2010-04-03 LAB — COMPREHENSIVE METABOLIC PANEL
ALT: 24 U/L (ref 0–35)
ALT: 26 U/L (ref 0–35)
AST: 34 U/L (ref 0–37)
AST: 40 U/L — ABNORMAL HIGH (ref 0–37)
Albumin: 2.7 g/dL — ABNORMAL LOW (ref 3.5–5.2)
Albumin: 3.2 g/dL — ABNORMAL LOW (ref 3.5–5.2)
Alkaline Phosphatase: 59 U/L (ref 39–117)
Alkaline Phosphatase: 71 U/L (ref 39–117)
BUN: 30 mg/dL — ABNORMAL HIGH (ref 6–23)
BUN: 44 mg/dL — ABNORMAL HIGH (ref 6–23)
CO2: 23 mEq/L (ref 19–32)
CO2: 25 mEq/L (ref 19–32)
Calcium: 8.6 mg/dL (ref 8.4–10.5)
Calcium: 8.9 mg/dL (ref 8.4–10.5)
Chloride: 101 mEq/L (ref 96–112)
Chloride: 90 mEq/L — ABNORMAL LOW (ref 96–112)
Creatinine, Ser: 2.13 mg/dL — ABNORMAL HIGH (ref 0.4–1.2)
Creatinine, Ser: 2.9 mg/dL — ABNORMAL HIGH (ref 0.4–1.2)
GFR calc Af Amer: 19 mL/min — ABNORMAL LOW (ref >60)
GFR calc Af Amer: 27 mL/min — ABNORMAL LOW (ref >60)
GFR calc non Af Amer: 16 mL/min — ABNORMAL LOW (ref >60)
GFR calc non Af Amer: 22 mL/min — ABNORMAL LOW (ref >60)
Glucose, Bld: 112 mg/dL — ABNORMAL HIGH (ref 70–99)
Glucose, Bld: 170 mg/dL — ABNORMAL HIGH (ref 70–99)
Potassium: 4 mEq/L (ref 3.5–5.1)
Potassium: 4 mEq/L (ref 3.5–5.1)
Sodium: 125 mEq/L — ABNORMAL LOW (ref 135–145)
Sodium: 132 mEq/L — ABNORMAL LOW (ref 135–145)
Total Bilirubin: 0.8 mg/dL (ref 0.3–1.2)
Total Bilirubin: 0.8 mg/dL (ref 0.3–1.2)
Total Protein: 6.1 g/dL (ref 6.0–8.3)
Total Protein: 7 g/dL (ref 6.0–8.3)

## 2010-04-03 LAB — POCT CARDIAC MARKERS
CKMB, poc: 1 ng/mL (ref 1.0–8.0)
Myoglobin, poc: 107 ng/mL (ref 12–200)
Troponin i, poc: <0.05 ng/mL (ref 0.00–0.09)

## 2010-04-03 LAB — BASIC METABOLIC PANEL
BUN: 10 mg/dL (ref 6–23)
BUN: 11 mg/dL (ref 6–23)
BUN: 17 mg/dL (ref 6–23)
CO2: 23 mEq/L (ref 19–32)
CO2: 24 mEq/L (ref 19–32)
CO2: 26 mEq/L (ref 19–32)
Calcium: 8.2 mg/dL — ABNORMAL LOW (ref 8.4–10.5)
Calcium: 8.4 mg/dL (ref 8.4–10.5)
Calcium: 8.6 mg/dL (ref 8.4–10.5)
Chloride: 100 mEq/L (ref 96–112)
Chloride: 103 mEq/L (ref 96–112)
Chloride: 104 mEq/L (ref 96–112)
Creatinine, Ser: 1.24 mg/dL — ABNORMAL HIGH (ref 0.4–1.2)
Creatinine, Ser: 1.25 mg/dL — ABNORMAL HIGH (ref 0.4–1.2)
Creatinine, Ser: 1.37 mg/dL — ABNORMAL HIGH (ref 0.4–1.2)
GFR calc Af Amer: 45 mL/min — ABNORMAL LOW (ref >60)
GFR calc Af Amer: 50 mL/min — ABNORMAL LOW (ref >60)
GFR calc Af Amer: 50 mL/min — ABNORMAL LOW (ref >60)
GFR calc non Af Amer: 37 mL/min — ABNORMAL LOW (ref >60)
GFR calc non Af Amer: 41 mL/min — ABNORMAL LOW (ref >60)
GFR calc non Af Amer: 41 mL/min — ABNORMAL LOW (ref >60)
Glucose, Bld: 110 mg/dL — ABNORMAL HIGH (ref 70–99)
Glucose, Bld: 111 mg/dL — ABNORMAL HIGH (ref 70–99)
Glucose, Bld: 133 mg/dL — ABNORMAL HIGH (ref 70–99)
Potassium: 3.3 mEq/L — ABNORMAL LOW (ref 3.5–5.1)
Potassium: 3.3 mEq/L — ABNORMAL LOW (ref 3.5–5.1)
Potassium: 3.5 mEq/L (ref 3.5–5.1)
Sodium: 133 mEq/L — ABNORMAL LOW (ref 135–145)
Sodium: 134 mEq/L — ABNORMAL LOW (ref 135–145)
Sodium: 136 mEq/L (ref 135–145)

## 2010-04-03 LAB — URINALYSIS, ROUTINE W REFLEX MICROSCOPIC
Bilirubin Urine: NEGATIVE
Glucose, UA: NEGATIVE mg/dL
Hgb urine dipstick: NEGATIVE
Ketones, ur: NEGATIVE mg/dL
Nitrite: NEGATIVE
Protein, ur: NEGATIVE mg/dL
Specific Gravity, Urine: 1.006 (ref 1.005–1.030)
Urobilinogen, UA: 0.2 mg/dL (ref 0.0–1.0)
pH: 6 (ref 5.0–8.0)

## 2010-04-03 LAB — DIFFERENTIAL
Basophils Absolute: 0 10*3/uL (ref 0.0–0.1)
Basophils Relative: 0 % (ref 0–1)
Eosinophils Absolute: 0.1 10*3/uL (ref 0.0–0.7)
Eosinophils Relative: 3 % (ref 0–5)
Lymphocytes Relative: 14 % (ref 12–46)
Lymphs Abs: 0.6 10*3/uL — ABNORMAL LOW (ref 0.7–4.0)
Monocytes Absolute: 0.7 10*3/uL (ref 0.1–1.0)
Monocytes Relative: 15 % — ABNORMAL HIGH (ref 3–12)
Neutro Abs: 3.1 10*3/uL (ref 1.7–7.7)
Neutrophils Relative %: 68 % (ref 43–77)

## 2010-04-03 LAB — CBC
HCT: 22.8 % — ABNORMAL LOW (ref 36.0–46.0)
HCT: 23.4 % — ABNORMAL LOW (ref 36.0–46.0)
HCT: 24.2 % — ABNORMAL LOW (ref 36.0–46.0)
HCT: 25.6 % — ABNORMAL LOW (ref 36.0–46.0)
Hemoglobin: 7.8 g/dL — ABNORMAL LOW (ref 12.0–15.0)
Hemoglobin: 8.1 g/dL — ABNORMAL LOW (ref 12.0–15.0)
Hemoglobin: 8.2 g/dL — ABNORMAL LOW (ref 12.0–15.0)
Hemoglobin: 8.8 g/dL — ABNORMAL LOW (ref 12.0–15.0)
MCH: 34.5 pg — ABNORMAL HIGH (ref 26.0–34.0)
MCH: 34.7 pg — ABNORMAL HIGH (ref 26.0–34.0)
MCH: 34.8 pg — ABNORMAL HIGH (ref 26.0–34.0)
MCH: 35.5 pg — ABNORMAL HIGH (ref 26.0–34.0)
MCHC: 33.5 g/dL (ref 30.0–36.0)
MCHC: 34.2 g/dL (ref 30.0–36.0)
MCHC: 34.4 g/dL (ref 30.0–36.0)
MCHC: 35 g/dL (ref 30.0–36.0)
MCV: 100.4 fL — ABNORMAL HIGH (ref 78.0–100.0)
MCV: 101.3 fL — ABNORMAL HIGH (ref 78.0–100.0)
MCV: 101.3 fL — ABNORMAL HIGH (ref 78.0–100.0)
MCV: 103.9 fL — ABNORMAL HIGH (ref 78.0–100.0)
Platelets: 295 10*3/uL (ref 150–400)
Platelets: 313 10*3/uL (ref 150–400)
Platelets: 326 10*3/uL (ref 150–400)
Platelets: 347 10*3/uL (ref 150–400)
RBC: 2.25 MIL/uL — ABNORMAL LOW (ref 3.87–5.11)
RBC: 2.31 MIL/uL — ABNORMAL LOW (ref 3.87–5.11)
RBC: 2.33 MIL/uL — ABNORMAL LOW (ref 3.87–5.11)
RBC: 2.55 MIL/uL — ABNORMAL LOW (ref 3.87–5.11)
RDW: 11.9 % (ref 11.5–15.5)
RDW: 11.9 % (ref 11.5–15.5)
RDW: 12.1 % (ref 11.5–15.5)
RDW: 12.2 % (ref 11.5–15.5)
WBC: 3.3 10*3/uL — ABNORMAL LOW (ref 4.0–10.5)
WBC: 3.8 10*3/uL — ABNORMAL LOW (ref 4.0–10.5)
WBC: 4 10*3/uL (ref 4.0–10.5)
WBC: 4.5 10*3/uL (ref 4.0–10.5)

## 2010-04-03 LAB — CK TOTAL AND CKMB (NOT AT ARMC)
CK, MB: 1.1 ng/mL (ref 0.3–4.0)
Relative Index: INVALID (ref 0.0–2.5)
Total CK: 42 U/L (ref 7–177)

## 2010-04-03 LAB — PROTIME-INR
INR: 1.08 (ref 0.00–1.49)
INR: 1.19 (ref 0.00–1.49)
INR: 1.2 (ref 0.00–1.49)
INR: 1.37 (ref 0.00–1.49)
INR: 1.76 — ABNORMAL HIGH (ref 0.00–1.49)
Prothrombin Time: 14.2 seconds (ref 11.6–15.2)
Prothrombin Time: 15.3 seconds — ABNORMAL HIGH (ref 11.6–15.2)
Prothrombin Time: 15.4 seconds — ABNORMAL HIGH (ref 11.6–15.2)
Prothrombin Time: 17.1 seconds — ABNORMAL HIGH (ref 11.6–15.2)
Prothrombin Time: 20.7 seconds — ABNORMAL HIGH (ref 11.6–15.2)

## 2010-04-03 LAB — VITAMIN B12: Vitamin B-12: 1144 pg/mL — ABNORMAL HIGH (ref 211–911)

## 2010-04-03 LAB — HEMOGLOBIN AND HEMATOCRIT, BLOOD
HCT: 24.8 % — ABNORMAL LOW (ref 36.0–46.0)
Hemoglobin: 8.2 g/dL — ABNORMAL LOW (ref 12.0–15.0)

## 2010-04-03 LAB — HEMOCCULT GUIAC POC 1CARD (OFFICE): Fecal Occult Bld: NEGATIVE

## 2010-04-03 LAB — TROPONIN I: Troponin I: <0.01 ng/mL (ref 0.00–0.06)

## 2010-04-03 LAB — BRAIN NATRIURETIC PEPTIDE: Pro B Natriuretic peptide (BNP): 252 pg/mL — ABNORMAL HIGH (ref 0.0–100.0)

## 2010-04-03 LAB — TYPE AND SCREEN
ABO/RH(D): O POS
Antibody Screen: NEGATIVE

## 2010-04-04 ENCOUNTER — Encounter: Payer: Medicare Other | Admitting: Rehabilitation

## 2010-04-04 ENCOUNTER — Encounter (INDEPENDENT_AMBULATORY_CARE_PROVIDER_SITE_OTHER): Payer: Medicare Other

## 2010-04-04 ENCOUNTER — Encounter: Payer: Self-pay | Admitting: Internal Medicine

## 2010-04-04 DIAGNOSIS — I4891 Unspecified atrial fibrillation: Secondary | ICD-10-CM

## 2010-04-04 DIAGNOSIS — Z7901 Long term (current) use of anticoagulants: Secondary | ICD-10-CM

## 2010-04-04 LAB — CONVERTED CEMR LAB: POC INR: 2.5

## 2010-04-05 ENCOUNTER — Encounter (HOSPITAL_BASED_OUTPATIENT_CLINIC_OR_DEPARTMENT_OTHER): Payer: Medicare Other | Admitting: Oncology

## 2010-04-05 ENCOUNTER — Other Ambulatory Visit: Payer: Self-pay | Admitting: Oncology

## 2010-04-05 DIAGNOSIS — D649 Anemia, unspecified: Secondary | ICD-10-CM

## 2010-04-05 DIAGNOSIS — D509 Iron deficiency anemia, unspecified: Secondary | ICD-10-CM

## 2010-04-05 LAB — CBC WITH DIFFERENTIAL/PLATELET
BASO%: 1.2 % (ref 0.0–2.0)
Basophils Absolute: 0.1 10*3/uL (ref 0.0–0.1)
EOS%: 1.5 % (ref 0.0–7.0)
Eosinophils Absolute: 0.1 10*3/uL (ref 0.0–0.5)
HCT: 41.6 % (ref 34.8–46.6)
HGB: 14 g/dL (ref 11.6–15.9)
LYMPH%: 22.7 % (ref 14.0–49.7)
MCH: 34.9 pg — ABNORMAL HIGH (ref 25.1–34.0)
MCHC: 33.6 g/dL (ref 31.5–36.0)
MCV: 103.8 fL — ABNORMAL HIGH (ref 79.5–101.0)
MONO#: 0.6 10*3/uL (ref 0.1–0.9)
MONO%: 11.4 % (ref 0.0–14.0)
NEUT#: 3.4 10*3/uL (ref 1.5–6.5)
NEUT%: 63.2 % (ref 38.4–76.8)
Platelets: 256 10*3/uL (ref 145–400)
RBC: 4.01 10*6/uL (ref 3.70–5.45)
RDW: 13.5 % (ref 11.2–14.5)
WBC: 5.4 10*3/uL (ref 3.9–10.3)
lymph#: 1.2 10*3/uL (ref 0.9–3.3)

## 2010-04-05 LAB — FERRITIN: Ferritin: 335 ng/mL — ABNORMAL HIGH (ref 10–291)

## 2010-04-05 LAB — COMPREHENSIVE METABOLIC PANEL
ALT: 21 U/L (ref 0–35)
AST: 22 U/L (ref 0–37)
Albumin: 4.3 g/dL (ref 3.5–5.2)
Alkaline Phosphatase: 81 U/L (ref 39–117)
BUN: 13 mg/dL (ref 6–23)
CO2: 25 mEq/L (ref 19–32)
Calcium: 9.4 mg/dL (ref 8.4–10.5)
Chloride: 97 mEq/L (ref 96–112)
Creatinine, Ser: 0.97 mg/dL (ref 0.40–1.20)
Glucose, Bld: 128 mg/dL — ABNORMAL HIGH (ref 70–99)
Potassium: 3.6 mEq/L (ref 3.5–5.3)
Sodium: 135 mEq/L (ref 135–145)
Total Bilirubin: 1.2 mg/dL (ref 0.3–1.2)
Total Protein: 7.4 g/dL (ref 6.0–8.3)

## 2010-04-05 LAB — IRON AND TIBC
%SAT: 32 % (ref 20–55)
Iron: 96 ug/dL (ref 42–145)
TIBC: 298 ug/dL (ref 250–470)
UIBC: 202 ug/dL

## 2010-04-08 ENCOUNTER — Ambulatory Visit (INDEPENDENT_AMBULATORY_CARE_PROVIDER_SITE_OTHER): Payer: Medicare Other | Admitting: Internal Medicine

## 2010-04-08 ENCOUNTER — Encounter: Payer: Self-pay | Admitting: Internal Medicine

## 2010-04-08 DIAGNOSIS — R109 Unspecified abdominal pain: Secondary | ICD-10-CM

## 2010-04-08 DIAGNOSIS — M545 Low back pain, unspecified: Secondary | ICD-10-CM

## 2010-04-08 DIAGNOSIS — E039 Hypothyroidism, unspecified: Secondary | ICD-10-CM

## 2010-04-08 DIAGNOSIS — M25559 Pain in unspecified hip: Secondary | ICD-10-CM

## 2010-04-08 DIAGNOSIS — I10 Essential (primary) hypertension: Secondary | ICD-10-CM

## 2010-04-09 ENCOUNTER — Encounter: Payer: Medicare Other | Admitting: Rehabilitation

## 2010-04-09 NOTE — Progress Notes (Signed)
  Phone Note Call from Patient   Caller: Patient Summary of Call: Patient called [facility] requesting a call back about a referral..Marland KitchenAlvy Beal Archie CMA  March 06, 2010 5:18 PM   Follow-up for Phone Call        Returned call to patient who is requesting status of Neuro appt. Please advise thanks.Alvy Beal Archie CMA  March 07, 2010 9:54 AM   Additional Follow-up for Phone Call Additional follow up Details #1::        called [facility] to check the  status awaiting a call Shelbie Proctor  March 14, 2010 12:03 PM

## 2010-04-09 NOTE — Medication Information (Signed)
Summary: rov/ewj  Anticoagulant Therapy  Managed by: Cloyde Reams, RN, BSN Referring MD: Verne Carrow PCP: Tresa Garter MD Supervising MD: Johney Frame MD, Fayrene Fearing Indication 1: Atrial Fibrillation Lab Used: [facility] [place] Site: [address] INR POC 2.5 INR RANGE 2.0-3.0  Dietary changes: no    Health status changes: no    Bleeding/hemorrhagic complications: no    Recent/future hospitalizations: no    Any changes in medication regimen? no    Recent/future dental: no  Any missed doses?: no       Is patient compliant with meds? yes       Allergies: 1)  ! Macrodantin (Nitrofurantoin Macrocrystal) 2)  ! Codeine Sulfate (Codeine Sulfate) 3)  Atenolol (Atenolol) 4)  Percocet (Oxycodone-Acetaminophen) 5)  Biaxin  Anticoagulation Management History:      The patient is taking warfarin and comes in today for a routine follow up visit.  Positive risk factors for bleeding include an age of 75 years or older, history of GI bleeding, and presence of serious comorbidities.  The bleeding index is 'high risk'.  Positive CHADS2 values include History of HTN and Age > 73 years old.  Her last INR was 1.6 ratio.  Anticoagulation responsible provider: Billyjoe Go MD, Fayrene Fearing.  INR POC: 2.5.  Cuvette Lot#: [id number].  Exp: 03/2011.    Anticoagulation Management Assessment/Plan:      The patient's current anticoagulation dose is Warfarin sodium 5 mg tabs: as directed.  The target INR is 2.0-3.0.  The next INR is due 05/02/2010.  Anticoagulation instructions were given to patient.  Results were reviewed/authorized by Cloyde Reams, RN, BSN.  She was notified by Cloyde Reams RN.         Prior Anticoagulation Instructions: INR 2.9  Continue your current dose of 1/2 tablet everyday except on Mondays, Wednesdays, and Fridays when you take 1 tablet.  Recheck INR in 4 weeks.    Current Anticoagulation Instructions: INR 2.5  Continue on same dosage 1/2 tablet daily except 1  tablet on Mondays, Wednesdays, and Fridays.  Recheck in 4 weeks.

## 2010-04-11 ENCOUNTER — Encounter: Payer: Medicare Other | Admitting: Rehabilitation

## 2010-04-16 ENCOUNTER — Encounter: Payer: Medicare Other | Admitting: Rehabilitation

## 2010-04-17 ENCOUNTER — Ambulatory Visit: Payer: Medicare Other | Admitting: Rehabilitation

## 2010-04-18 NOTE — Assessment & Plan Note (Signed)
Summary: 6 WK FU /NWS   Vital Signs:  Patient profile:   77 year old female Height:      64 inches Weight:      175 pounds BMI:     30.15 Temp:     97.0 degrees F oral Pulse rate:   88 / minute Pulse rhythm:   regular Resp:     16 per minute BP sitting:   140 / 90  (left arm) Cuff size:   regular  Vitals Entered By: Lanier Prude, CMA(AAMA) (April 08, 2010 2:07 PM) CC: 6 wk f/u Is Patient Diabetic? No Comments pt is no longer using Tens unit   Primary Care Provider:  Tresa Garter MD  CC:  6 wk f/u.  History of Present Illness: The patient presents for a follow up of back pain, anxiety, depression and HTN.  Dr Danielle Dess said that her  R hip is bad and she needs to see her ortho  Current Medications (verified): 1)  Temazepam 30 Mg  Caps (Temazepam) .... Take 1 Tablet By Mouth Once A Day 2)  Furosemide 40 Mg Tabs (Furosemide) .[name].. 1 By Mouth Every Other Day As Needed Swelling 3)  Vitamin D3 1000 Unit  Tabs (Cholecalciferol) .[name].. 1 Qd 4)  Vitamin B Complex   Caps (B Complex Vitamins) .... Take 1 Tablet By Mouth Once A Day 5)  Synthroid 25 Mcg Tabs (Levothyroxine Sodium) .[name].. 1 By Mouth Once Daily For Thyroid 6)  Cardizem Cd 120 Mg Xr24h-Cap (Diltiazem Hcl Coated Beads) .[name].. 1 Cap Once Daily 7)  Metoprolol Succinate 50 Mg Xr24h-Tab (Metoprolol Succinate) .... Take One Tablet By Mouth Two Times A Day. 8)  Protonix 40 Mg Tbec (Pantoprazole Sodium) .[name].. 1 Tab Once Daily 9)  Warfarin Sodium 5 Mg Tabs (Warfarin Sodium) .... As Directed 10)  Hydroxyzine Hcl 25 Mg Tabs (Hydroxyzine Hcl) .[name].. 1-2 Tabs By Mouth Two Times A Day As Needed Itching 11)  Ferrex 150 Forte 150-25-1 Mg-Mcg-Mg Caps (Iron Polysacch Cmplx-B12-Fa) .[name].. 1 By Mouth Two Times A Day  Take With Vit C 250 Mg Two Times A Day 12)  Loratadine 10 Mg Tabs (Loratadine) .[name].. 1 By Mouth Once Daily As Needed Allergies 13)  Wheelchair .... As Dirr 14)  Tramadol Hcl 50 Mg Tabs (Tramadol Hcl) .[name].. 1-2 Tabs By Mouth Two Times A  Day As Needed Pain 15)  Cyclobenzaprine Hcl 5 Mg Tabs (Cyclobenzaprine Hcl) .[name].. 1 By Mouth Two Times A Day As Needed Spasm 16)  Tens Unit .... As Dirr. By Pt Dx Lbp  Allergies (verified): 1)  ! Macrodantin (Nitrofurantoin Macrocrystal) 2)  ! Codeine Sulfate (Codeine Sulfate) 3)  Atenolol (Atenolol) 4)  Percocet (Oxycodone-Acetaminophen) 5)  Biaxin  Past History:  Past Medical History: Last updated: 11/22/2009 Paroxysmal Atrial Fibrillation on coumadin therapy Hypertension Low back pain, severe lumbar spondylosis s/p L3/L4, L4/L5 fusion 12/10. Osteoarthritis Dr Juliene Pina Osteopenia Elevated mcv 790.09 Constipation  564.00Iischemic colitis with rectal bleeding  558.9 Borderline low B12 Renal insufficiency Hypothyroidism 2011  NSAID gastropathy w/bleed 2011  Anemia-iron deficiency 2011and elev. MCV Peptic ulcer disease  Social History: Last updated: 10/26/2009 Never Smoked 1 glass of wine per day No illicit drugs Retired [employer] Married, 2 children-ages [phone].     Review of Systems       The patient complains of muscle weakness, difficulty walking, and depression.  The patient denies fever, syncope, dyspnea on exertion, and abdominal pain.    Physical Exam  General:  Not ill-appearing ,  not pale, NAD Mouth:  Not dry Neck:  supple Lungs:  CTA Heart:  Irreg irreg Abdomen:  Bowel sounds positive,abdomen soft and non-tender without masses, organomegaly or hernias noted. Msk:  Using a walker R groin is tender R troch bursa is tender She can't lift R leg up > 6" Lumbar-sacral spine is tender to palpation over paraspinal muscles and painfull with the ROM  Extremities:  no edema Neurologic:  alert & oriented X3.   Skin:  No eryth patchy rash on back Psych:  Oriented X3.  Not depressed or anxious   Impression & Recommendations:  Problem # 1:  LOW BACK PAIN (ICD-724.2) Assessment Unchanged  The following medications were removed from the medication  list:    Cyclobenzaprine Hcl 5 Mg Tabs (Cyclobenzaprine hcl) .[name]... 1 by mouth two times a day as needed spasm Her updated medication list for this problem includes:    Tramadol Hcl 50 Mg Tabs (Tramadol hcl) .[name]... 1-2 tabs by mouth two times a day as needed pain TENS did not work  Problem # 2:  HIP PAIN (ICD-719.45) R Assessment: Deteriorated She had xrays Appt w/Dr Juliene Pina is pending  The following medications were removed from the medication list:    Cyclobenzaprine Hcl 5 Mg Tabs (Cyclobenzaprine hcl) .[name]... 1 by mouth two times a day as needed spasm Her updated medication list for this problem includes:    Tramadol Hcl 50 Mg Tabs (Tramadol hcl) .[name]... 1-2 tabs by mouth two times a day as needed pain  Problem # 3:  HYPOTHYROIDISM (ICD-244.9) Assessment: Unchanged  Her updated medication list for this problem includes:    Synthroid 25 Mcg Tabs (Levothyroxine sodium) .[name]... 1 by mouth once daily for thyroid  Problem # 4:  GROIN PAIN (ICD-789.09) Assessment: Unchanged  The following medications were removed from the medication list:    Cyclobenzaprine Hcl 5 Mg Tabs (Cyclobenzaprine hcl) .[name]... 1 by mouth two times a day as needed spasm Her updated medication list for this problem includes:    Tramadol Hcl 50 Mg Tabs (Tramadol hcl) .[name]... 1-2 tabs by mouth two times a day as needed pain  Problem # 5:  HYPERTENSION (ICD-401.9)  Her updated medication list for this problem includes:    Furosemide 40 Mg Tabs (Furosemide) .[name]... 1 by mouth every other day as needed swelling    Cardizem Cd 120 Mg Xr24h-cap (Diltiazem hcl coated beads) .[name]... 1 cap once daily    Metoprolol Succinate 50 Mg Xr24h-tab (Metoprolol succinate) .[name]... Take one tablet by mouth two times a day.  Problem # 6:  FATIGUE (ICD-780.79)  Complete Medication List: 1)  Temazepam 30 Mg Caps (Temazepam) .... Take 1 tablet by mouth once a day 2)  Furosemide 40 Mg Tabs (Furosemide) .[name].. 1 by mouth every other day as needed  swelling 3)  Vitamin D3 1000 Unit Tabs (Cholecalciferol) .[name].. 1 qd 4)  Vitamin B Complex Caps (B complex vitamins) .... Take 1 tablet by mouth once a day 5)  Synthroid 25 Mcg Tabs (Levothyroxine sodium) .[name].. 1 by mouth once daily for thyroid 6)  Cardizem Cd 120 Mg Xr24h-cap (Diltiazem hcl coated beads) .[name].. 1 cap once daily 7)  Metoprolol Succinate 50 Mg Xr24h-tab (Metoprolol succinate) .... Take one tablet by mouth two times a day. 8)  Protonix 40 Mg Tbec (Pantoprazole sodium) .[name].. 1 tab once daily 9)  Warfarin Sodium 5 Mg Tabs (Warfarin sodium) .... As directed 10)  Ferrex 150 Forte 150-25-1 Mg-mcg-mg Caps (Iron polysacch cmplx-b12-fa) .[name].. 1 by  mouth two times a day  take with vit c 250 mg two times a day 11)  Loratadine 10 Mg Tabs (Loratadine) .[name].. 1 by mouth once daily as needed allergies 12)  Tramadol Hcl 50 Mg Tabs (Tramadol hcl) .[name].. 1-2 tabs by mouth two times a day as needed pain 13)  Wheelchair  .... As dirr 14)  Wellbutrin Sr 150 Mg Xr12h-tab (Bupropion hcl) .[name].. 1 by mouth q am and q lunch (two times a day)  Patient Instructions: 1)  Please schedule a follow-up appointment in 3 months. 2)  BMP prior to visit, ICD-9: 401.1 3)  Hepatic Panel prior to visit, ICD-9: 4)  CBC w/ Diff prior to visit, ICD-9: 5)  iron/tibc 280.9 Prescriptions: WELLBUTRIN SR 150 MG XR12H-TAB (BUPROPION HCL) 1 by mouth q am and q lunch (two times a day)  #60 x 6   Entered and Authorized by:   Tresa Garter MD   Signed by:   Tresa Garter MD on 04/08/2010   Method used:   Electronically to        [facility] [address] [phone][facility] (retail)       [id number]       [place], [place]  [address]       Ph: [phone] or [phone]       Fax: [phone]   RxID:   [id number]    Orders Added: 1)  Est. Patient Level IV [36644]

## 2010-04-19 ENCOUNTER — Telehealth: Payer: Self-pay | Admitting: *Deleted

## 2010-04-19 ENCOUNTER — Telehealth: Payer: Self-pay | Admitting: Cardiovascular Disease

## 2010-04-19 NOTE — Telephone Encounter (Signed)
No ans, no vm.  

## 2010-04-19 NOTE — Telephone Encounter (Signed)
Pt needs to have hip surgery and wants to know if she needs to be seen sooner than 5/1 for clearance or if they can clear her without being seen. Dr Darrelyn Hillock is doing the surgery.

## 2010-04-19 NOTE — Telephone Encounter (Signed)
Patient requesting a call back

## 2010-04-19 NOTE — Telephone Encounter (Signed)
Patient is to have hip surgery 06/07/10. She will need to hold Coumadin 5 days prior. She has a f /u appointment with Dr. Clifton James on 05/21/10.

## 2010-04-22 LAB — BASIC METABOLIC PANEL
BUN: 11 mg/dL (ref 6–23)
BUN: 12 mg/dL (ref 6–23)
BUN: 18 mg/dL (ref 6–23)
BUN: 21 mg/dL (ref 6–23)
CO2: 27 mEq/L (ref 19–32)
CO2: 28 mEq/L (ref 19–32)
CO2: 29 mEq/L (ref 19–32)
CO2: 30 mEq/L (ref 19–32)
Calcium: 8.2 mg/dL — ABNORMAL LOW (ref 8.4–10.5)
Calcium: 8.9 mg/dL (ref 8.4–10.5)
Calcium: 9.2 mg/dL (ref 8.4–10.5)
Calcium: 9.4 mg/dL (ref 8.4–10.5)
Chloride: 94 mEq/L — ABNORMAL LOW (ref 96–112)
Chloride: 97 mEq/L (ref 96–112)
Chloride: 99 mEq/L (ref 96–112)
Chloride: 99 mEq/L (ref 96–112)
Creatinine, Ser: 1.02 mg/dL (ref 0.4–1.2)
Creatinine, Ser: 1.15 mg/dL (ref 0.4–1.2)
Creatinine, Ser: 1.24 mg/dL — ABNORMAL HIGH (ref 0.4–1.2)
Creatinine, Ser: 1.4 mg/dL — ABNORMAL HIGH (ref 0.4–1.2)
GFR calc Af Amer: 44 mL/min — ABNORMAL LOW (ref >60)
GFR calc Af Amer: 50 mL/min — ABNORMAL LOW (ref >60)
GFR calc Af Amer: 55 mL/min — ABNORMAL LOW (ref >60)
GFR calc Af Amer: >60 mL/min (ref >60)
GFR calc non Af Amer: 36 mL/min — ABNORMAL LOW (ref >60)
GFR calc non Af Amer: 42 mL/min — ABNORMAL LOW (ref >60)
GFR calc non Af Amer: 45 mL/min — ABNORMAL LOW (ref >60)
GFR calc non Af Amer: 52 mL/min — ABNORMAL LOW (ref >60)
Glucose, Bld: 93 mg/dL (ref 70–99)
Glucose, Bld: 95 mg/dL (ref 70–99)
Glucose, Bld: 96 mg/dL (ref 70–99)
Glucose, Bld: 98 mg/dL (ref 70–99)
Potassium: 3.4 mEq/L — ABNORMAL LOW (ref 3.5–5.1)
Potassium: 3.8 mEq/L (ref 3.5–5.1)
Potassium: 4 mEq/L (ref 3.5–5.1)
Potassium: 4 mEq/L (ref 3.5–5.1)
Sodium: 132 mEq/L — ABNORMAL LOW (ref 135–145)
Sodium: 133 mEq/L — ABNORMAL LOW (ref 135–145)
Sodium: 133 mEq/L — ABNORMAL LOW (ref 135–145)
Sodium: 134 mEq/L — ABNORMAL LOW (ref 135–145)

## 2010-04-22 LAB — PROTIME-INR
INR: 1.1 (ref 0.00–1.49)
INR: 1.11 (ref 0.00–1.49)
INR: 1.16 (ref 0.00–1.49)
INR: 1.51 — ABNORMAL HIGH (ref 0.00–1.49)
INR: 1.67 — ABNORMAL HIGH (ref 0.00–1.49)
Prothrombin Time: 14.1 seconds (ref 11.6–15.2)
Prothrombin Time: 14.2 seconds (ref 11.6–15.2)
Prothrombin Time: 14.7 seconds (ref 11.6–15.2)
Prothrombin Time: 18.1 seconds — ABNORMAL HIGH (ref 11.6–15.2)
Prothrombin Time: 19.6 seconds — ABNORMAL HIGH (ref 11.6–15.2)

## 2010-04-22 LAB — PHOSPHORUS: Phosphorus: 2.4 mg/dL (ref 2.3–4.6)

## 2010-04-22 LAB — MAGNESIUM: Magnesium: 2.8 mg/dL — ABNORMAL HIGH (ref 1.5–2.5)

## 2010-04-22 NOTE — Telephone Encounter (Signed)
Spoke with patient who advised that her ortho MD wants to do a complete hip replacement on 06/07/10. She request to moved up the current appt for 5/22 with PCP. Patient transferred to schedulers

## 2010-04-22 NOTE — Telephone Encounter (Signed)
Will have Dr. Clifton James review.

## 2010-04-23 LAB — URINALYSIS, ROUTINE W REFLEX MICROSCOPIC
Bilirubin Urine: NEGATIVE
Glucose, UA: NEGATIVE mg/dL
Ketones, ur: NEGATIVE mg/dL
Nitrite: NEGATIVE
Protein, ur: NEGATIVE mg/dL
Specific Gravity, Urine: 1.009 (ref 1.005–1.030)
Urobilinogen, UA: 0.2 mg/dL (ref 0.0–1.0)
pH: 7 (ref 5.0–8.0)

## 2010-04-23 LAB — BLOOD GAS, ARTERIAL
Acid-Base Excess: 2.5 mmol/L — ABNORMAL HIGH (ref 0.0–2.0)
Bicarbonate: 26.9 mEq/L — ABNORMAL HIGH (ref 20.0–24.0)
Drawn by: 31004
O2 Content: 2 L/min
O2 Saturation: 92.4 %
Patient temperature: 98.6
TCO2: 28.3 mmol/L (ref 0–100)
pCO2 arterial: 44.9 mmHg (ref 35.0–45.0)
pH, Arterial: 7.396 (ref 7.350–7.400)
pO2, Arterial: 58.4 mmHg — ABNORMAL LOW (ref 80.0–100.0)

## 2010-04-23 LAB — BASIC METABOLIC PANEL
BUN: 12 mg/dL (ref 6–23)
CO2: 27 mEq/L (ref 19–32)
Calcium: 8.3 mg/dL — ABNORMAL LOW (ref 8.4–10.5)
Chloride: 101 mEq/L (ref 96–112)
Creatinine, Ser: 1.33 mg/dL — ABNORMAL HIGH (ref 0.4–1.2)
GFR calc Af Amer: 46 mL/min — ABNORMAL LOW (ref >60)
GFR calc non Af Amer: 38 mL/min — ABNORMAL LOW (ref >60)
Glucose, Bld: 144 mg/dL — ABNORMAL HIGH (ref 70–99)
Potassium: 3.9 mEq/L (ref 3.5–5.1)
Sodium: 132 mEq/L — ABNORMAL LOW (ref 135–145)

## 2010-04-23 LAB — COMPREHENSIVE METABOLIC PANEL
ALT: 20 U/L (ref 0–35)
AST: 37 U/L (ref 0–37)
Albumin: 4.5 g/dL (ref 3.5–5.2)
Alkaline Phosphatase: 49 U/L (ref 39–117)
BUN: 22 mg/dL (ref 6–23)
CO2: 26 mEq/L (ref 19–32)
Calcium: 9.9 mg/dL (ref 8.4–10.5)
Chloride: 97 mEq/L (ref 96–112)
Creatinine, Ser: 1.45 mg/dL — ABNORMAL HIGH (ref 0.4–1.2)
GFR calc Af Amer: 42 mL/min — ABNORMAL LOW (ref >60)
GFR calc non Af Amer: 35 mL/min — ABNORMAL LOW (ref >60)
Glucose, Bld: 93 mg/dL (ref 70–99)
Potassium: 4.5 mEq/L (ref 3.5–5.1)
Sodium: 132 mEq/L — ABNORMAL LOW (ref 135–145)
Total Bilirubin: 1.7 mg/dL — ABNORMAL HIGH (ref 0.3–1.2)
Total Protein: 7.6 g/dL (ref 6.0–8.3)

## 2010-04-23 LAB — DIFFERENTIAL
Basophils Absolute: 0.1 10*3/uL (ref 0.0–0.1)
Basophils Relative: 2 % — ABNORMAL HIGH (ref 0–1)
Eosinophils Absolute: 0.2 10*3/uL (ref 0.0–0.7)
Eosinophils Relative: 3 % (ref 0–5)
Lymphocytes Relative: 28 % (ref 12–46)
Lymphs Abs: 1.5 10*3/uL (ref 0.7–4.0)
Monocytes Absolute: 0.6 10*3/uL (ref 0.1–1.0)
Monocytes Relative: 12 % (ref 3–12)
Neutro Abs: 2.8 10*3/uL (ref 1.7–7.7)
Neutrophils Relative %: 55 % (ref 43–77)

## 2010-04-23 LAB — CBC
HCT: 32.2 % — ABNORMAL LOW (ref 36.0–46.0)
HCT: 40 % (ref 36.0–46.0)
Hemoglobin: 10.9 g/dL — ABNORMAL LOW (ref 12.0–15.0)
Hemoglobin: 13.8 g/dL (ref 12.0–15.0)
MCHC: 33.9 g/dL (ref 30.0–36.0)
MCHC: 34.5 g/dL (ref 30.0–36.0)
MCV: 110.6 fL — ABNORMAL HIGH (ref 78.0–100.0)
MCV: 111.7 fL — ABNORMAL HIGH (ref 78.0–100.0)
Platelets: 169 10*3/uL (ref 150–400)
Platelets: 235 10*3/uL (ref 150–400)
RBC: 2.88 MIL/uL — ABNORMAL LOW (ref 3.87–5.11)
RBC: 3.62 MIL/uL — ABNORMAL LOW (ref 3.87–5.11)
RDW: 13.4 % (ref 11.5–15.5)
RDW: 13.8 % (ref 11.5–15.5)
WBC: 5.2 10*3/uL (ref 4.0–10.5)
WBC: 7.7 10*3/uL (ref 4.0–10.5)

## 2010-04-23 LAB — PROTIME-INR
INR: 1.18 (ref 0.00–1.49)
INR: 1.9 — ABNORMAL HIGH (ref 0.00–1.49)
Prothrombin Time: 14.9 seconds (ref 11.6–15.2)
Prothrombin Time: 21.6 seconds — ABNORMAL HIGH (ref 11.6–15.2)

## 2010-04-23 LAB — APTT
aPTT: 28 seconds (ref 24–37)
aPTT: 34 seconds (ref 24–37)

## 2010-04-23 LAB — URINE MICROSCOPIC-ADD ON

## 2010-04-23 LAB — CK TOTAL AND CKMB (NOT AT ARMC)
CK, MB: 3.6 ng/mL (ref 0.3–4.0)
Relative Index: 0.9 (ref 0.0–2.5)
Total CK: 412 U/L — ABNORMAL HIGH (ref 7–177)

## 2010-04-23 LAB — MRSA PCR SCREENING: MRSA by PCR: NEGATIVE

## 2010-04-23 LAB — TYPE AND SCREEN
ABO/RH(D): O POS
Antibody Screen: NEGATIVE

## 2010-04-23 LAB — PHOSPHORUS: Phosphorus: 2.3 mg/dL (ref 2.3–4.6)

## 2010-04-23 LAB — TROPONIN I: Troponin I: 0.02 ng/mL (ref 0.00–0.06)

## 2010-04-23 LAB — MAGNESIUM: Magnesium: 1.8 mg/dL (ref 1.5–2.5)

## 2010-04-23 LAB — ABO/RH: ABO/RH(D): O POS

## 2010-04-23 LAB — BRAIN NATRIURETIC PEPTIDE: Pro B Natriuretic peptide (BNP): 310 pg/mL — ABNORMAL HIGH (ref 0.0–100.0)

## 2010-04-26 ENCOUNTER — Emergency Department (HOSPITAL_COMMUNITY)
Admission: EM | Admit: 2010-04-26 | Discharge: 2010-04-26 | Disposition: A | Discharge status: Home / Self Care | Payer: Medicare Other | Attending: Emergency Medicine | Admitting: Emergency Medicine

## 2010-04-26 ENCOUNTER — Emergency Department (HOSPITAL_COMMUNITY): Payer: Medicare Other

## 2010-04-26 ENCOUNTER — Encounter (HOSPITAL_COMMUNITY): Payer: Self-pay | Admitting: Radiology

## 2010-04-26 DIAGNOSIS — M542 Cervicalgia: Secondary | ICD-10-CM | POA: Insufficient documentation

## 2010-04-26 DIAGNOSIS — I10 Essential (primary) hypertension: Secondary | ICD-10-CM | POA: Insufficient documentation

## 2010-04-26 DIAGNOSIS — X58XXXA Exposure to other specified factors, initial encounter: Secondary | ICD-10-CM | POA: Insufficient documentation

## 2010-04-26 DIAGNOSIS — M62838 Other muscle spasm: Secondary | ICD-10-CM | POA: Insufficient documentation

## 2010-04-26 DIAGNOSIS — M79609 Pain in unspecified limb: Secondary | ICD-10-CM | POA: Insufficient documentation

## 2010-04-26 DIAGNOSIS — Z7901 Long term (current) use of anticoagulants: Secondary | ICD-10-CM | POA: Insufficient documentation

## 2010-04-26 DIAGNOSIS — S139XXA Sprain of joints and ligaments of unspecified parts of neck, initial encounter: Secondary | ICD-10-CM | POA: Insufficient documentation

## 2010-04-26 DIAGNOSIS — I4891 Unspecified atrial fibrillation: Secondary | ICD-10-CM | POA: Insufficient documentation

## 2010-04-26 LAB — POCT CARDIAC MARKERS
CKMB, poc: <1 ng/mL — ABNORMAL LOW (ref 1.0–8.0)
Myoglobin, poc: 47.1 ng/mL (ref 12–200)
Troponin i, poc: <0.05 ng/mL (ref 0.00–0.09)

## 2010-04-26 LAB — POCT I-STAT, CHEM 8
BUN: 13 mg/dL (ref 6–23)
Calcium, Ion: 1.03 mmol/L — ABNORMAL LOW (ref 1.12–1.32)
Chloride: 97 mEq/L (ref 96–112)
Creatinine, Ser: 0.9 mg/dL (ref 0.4–1.2)
Glucose, Bld: 126 mg/dL — ABNORMAL HIGH (ref 70–99)
HCT: 44 % (ref 36.0–46.0)
Hemoglobin: 15 g/dL (ref 12.0–15.0)
Potassium: 3.3 mEq/L — ABNORMAL LOW (ref 3.5–5.1)
Sodium: 136 mEq/L (ref 135–145)
TCO2: 29 mmol/L (ref 0–100)

## 2010-04-26 LAB — PROTIME-INR
INR: 3.01 — ABNORMAL HIGH (ref 0.00–1.49)
Prothrombin Time: 31.3 seconds — ABNORMAL HIGH (ref 11.6–15.2)

## 2010-04-26 MED ORDER — IOHEXOL 350 MG/ML SOLN
100.0000 mL | Freq: Once | INTRAVENOUS | Status: AC | PRN
  Administered 2010-04-26: 100 mL via INTRAVENOUS

## 2010-04-27 ENCOUNTER — Emergency Department (HOSPITAL_COMMUNITY): Payer: Medicare Other

## 2010-04-27 ENCOUNTER — Inpatient Hospital Stay (HOSPITAL_COMMUNITY)
Admission: EM | Admit: 2010-04-27 | Discharge: 2010-05-01 | DRG: 556 | Disposition: A | Discharge status: Skilled Nursing Facility | Payer: Medicare Other | Attending: Internal Medicine | Admitting: Internal Medicine

## 2010-04-27 ENCOUNTER — Inpatient Hospital Stay (HOSPITAL_COMMUNITY): Payer: Medicare Other

## 2010-04-27 DIAGNOSIS — D7589 Other specified diseases of blood and blood-forming organs: Secondary | ICD-10-CM | POA: Diagnosis present

## 2010-04-27 DIAGNOSIS — E871 Hypo-osmolality and hyponatremia: Secondary | ICD-10-CM | POA: Diagnosis present

## 2010-04-27 DIAGNOSIS — E039 Hypothyroidism, unspecified: Secondary | ICD-10-CM | POA: Diagnosis present

## 2010-04-27 DIAGNOSIS — Z7901 Long term (current) use of anticoagulants: Secondary | ICD-10-CM

## 2010-04-27 DIAGNOSIS — I4891 Unspecified atrial fibrillation: Secondary | ICD-10-CM | POA: Diagnosis present

## 2010-04-27 DIAGNOSIS — M62838 Other muscle spasm: Principal | ICD-10-CM | POA: Diagnosis present

## 2010-04-27 DIAGNOSIS — M1610 Unilateral primary osteoarthritis, unspecified hip: Secondary | ICD-10-CM | POA: Diagnosis present

## 2010-04-27 DIAGNOSIS — M549 Dorsalgia, unspecified: Secondary | ICD-10-CM | POA: Diagnosis present

## 2010-04-27 DIAGNOSIS — M169 Osteoarthritis of hip, unspecified: Secondary | ICD-10-CM | POA: Diagnosis present

## 2010-04-27 DIAGNOSIS — G8929 Other chronic pain: Secondary | ICD-10-CM | POA: Diagnosis present

## 2010-04-27 DIAGNOSIS — M4802 Spinal stenosis, cervical region: Secondary | ICD-10-CM | POA: Diagnosis present

## 2010-04-27 DIAGNOSIS — E876 Hypokalemia: Secondary | ICD-10-CM | POA: Diagnosis present

## 2010-04-27 LAB — URINALYSIS, ROUTINE W REFLEX MICROSCOPIC
Bilirubin Urine: NEGATIVE
Glucose, UA: NEGATIVE mg/dL
Leukocytes, UA: NEGATIVE
Nitrite: NEGATIVE
Protein, ur: 100 mg/dL — AB
Specific Gravity, Urine: 1.018 (ref 1.005–1.030)
Urobilinogen, UA: 1 mg/dL (ref 0.0–1.0)
pH: 6.5 (ref 5.0–8.0)

## 2010-04-27 LAB — BASIC METABOLIC PANEL
BUN: 10 mg/dL (ref 6–23)
CO2: 30 mEq/L (ref 19–32)
Calcium: 9.5 mg/dL (ref 8.4–10.5)
Chloride: 95 mEq/L — ABNORMAL LOW (ref 96–112)
Creatinine, Ser: 0.91 mg/dL (ref 0.4–1.2)
GFR calc Af Amer: >60 mL/min (ref >60)
GFR calc non Af Amer: 59 mL/min — ABNORMAL LOW (ref >60)
Glucose, Bld: 115 mg/dL — ABNORMAL HIGH (ref 70–99)
Potassium: 3.2 mEq/L — ABNORMAL LOW (ref 3.5–5.1)
Sodium: 136 mEq/L (ref 135–145)

## 2010-04-27 LAB — CBC
HCT: 46 % (ref 36.0–46.0)
Hemoglobin: 15.3 g/dL — ABNORMAL HIGH (ref 12.0–15.0)
MCH: 34.5 pg — ABNORMAL HIGH (ref 26.0–34.0)
MCHC: 33.3 g/dL (ref 30.0–36.0)
MCV: 103.8 fL — ABNORMAL HIGH (ref 78.0–100.0)
Platelets: 228 10*3/uL (ref 150–400)
RBC: 4.43 MIL/uL (ref 3.87–5.11)
RDW: 12.8 % (ref 11.5–15.5)
WBC: 9.1 10*3/uL (ref 4.0–10.5)

## 2010-04-27 LAB — TROPONIN I: Troponin I: 0.02 ng/mL (ref 0.00–0.06)

## 2010-04-27 LAB — URINE MICROSCOPIC-ADD ON

## 2010-04-27 LAB — DIFFERENTIAL
Basophils Absolute: 0 10*3/uL (ref 0.0–0.1)
Basophils Relative: 0 % (ref 0–1)
Eosinophils Absolute: 0 10*3/uL (ref 0.0–0.7)
Eosinophils Relative: 0 % (ref 0–5)
Lymphocytes Relative: 8 % — ABNORMAL LOW (ref 12–46)
Lymphs Abs: 0.7 10*3/uL (ref 0.7–4.0)
Monocytes Absolute: 0.9 10*3/uL (ref 0.1–1.0)
Monocytes Relative: 10 % (ref 3–12)
Neutro Abs: 7.4 10*3/uL (ref 1.7–7.7)
Neutrophils Relative %: 82 % — ABNORMAL HIGH (ref 43–77)

## 2010-04-27 LAB — CARDIAC PANEL(CRET KIN+CKTOT+MB+TROPI)
CK, MB: 0.9 ng/mL (ref 0.3–4.0)
Relative Index: INVALID (ref 0.0–2.5)
Total CK: 34 U/L (ref 7–177)
Troponin I: 0.02 ng/mL (ref 0.00–0.06)

## 2010-04-27 LAB — PROTIME-INR
INR: 2.93 — ABNORMAL HIGH (ref 0.00–1.49)
Prothrombin Time: 30.6 seconds — ABNORMAL HIGH (ref 11.6–15.2)

## 2010-04-27 LAB — CK TOTAL AND CKMB (NOT AT ARMC)
CK, MB: 1.2 ng/mL (ref 0.3–4.0)
Relative Index: INVALID (ref 0.0–2.5)
Total CK: 42 U/L (ref 7–177)

## 2010-04-28 LAB — URINE CULTURE
Colony Count: 3000
Culture  Setup Time: 201204080019
Special Requests: POSITIVE

## 2010-04-28 LAB — CBC
HCT: 40.6 % (ref 36.0–46.0)
Hemoglobin: 13.5 g/dL (ref 12.0–15.0)
MCH: 34.5 pg — ABNORMAL HIGH (ref 26.0–34.0)
MCHC: 33.3 g/dL (ref 30.0–36.0)
MCV: 103.8 fL — ABNORMAL HIGH (ref 78.0–100.0)
Platelets: 228 10*3/uL (ref 150–400)
RBC: 3.91 MIL/uL (ref 3.87–5.11)
RDW: 13 % (ref 11.5–15.5)
WBC: 9.6 10*3/uL (ref 4.0–10.5)

## 2010-04-28 LAB — COMPREHENSIVE METABOLIC PANEL
ALT: 16 U/L (ref 0–35)
AST: 17 U/L (ref 0–37)
Albumin: 3.4 g/dL — ABNORMAL LOW (ref 3.5–5.2)
Alkaline Phosphatase: 68 U/L (ref 39–117)
BUN: 12 mg/dL (ref 6–23)
CO2: 27 mEq/L (ref 19–32)
Calcium: 9.1 mg/dL (ref 8.4–10.5)
Chloride: 96 mEq/L (ref 96–112)
Creatinine, Ser: 0.83 mg/dL (ref 0.4–1.2)
GFR calc Af Amer: >60 mL/min (ref >60)
GFR calc non Af Amer: >60 mL/min (ref >60)
Glucose, Bld: 102 mg/dL — ABNORMAL HIGH (ref 70–99)
Potassium: 3.8 mEq/L (ref 3.5–5.1)
Sodium: 130 mEq/L — ABNORMAL LOW (ref 135–145)
Total Bilirubin: 2.3 mg/dL — ABNORMAL HIGH (ref 0.3–1.2)
Total Protein: 7.2 g/dL (ref 6.0–8.3)

## 2010-04-28 LAB — CARDIAC PANEL(CRET KIN+CKTOT+MB+TROPI)
CK, MB: 0.8 ng/mL (ref 0.3–4.0)
Relative Index: INVALID (ref 0.0–2.5)
Total CK: 39 U/L (ref 7–177)
Troponin I: 0.01 ng/mL (ref 0.00–0.06)

## 2010-04-28 LAB — PROTIME-INR
INR: 3.32 — ABNORMAL HIGH (ref 0.00–1.49)
Prothrombin Time: 33.7 seconds — ABNORMAL HIGH (ref 11.6–15.2)

## 2010-04-28 LAB — BRAIN NATRIURETIC PEPTIDE: Pro B Natriuretic peptide (BNP): 123 pg/mL — ABNORMAL HIGH (ref 0.0–100.0)

## 2010-04-29 LAB — PROTIME-INR
INR: 3.23 — ABNORMAL HIGH (ref 0.00–1.49)
Prothrombin Time: 33 seconds — ABNORMAL HIGH (ref 11.6–15.2)

## 2010-04-29 LAB — BASIC METABOLIC PANEL
BUN: 11 mg/dL (ref 6–23)
CO2: 26 mEq/L (ref 19–32)
Calcium: 9.2 mg/dL (ref 8.4–10.5)
Chloride: 99 mEq/L (ref 96–112)
Creatinine, Ser: 0.82 mg/dL (ref 0.4–1.2)
GFR calc Af Amer: >60 mL/min (ref >60)
GFR calc non Af Amer: >60 mL/min (ref >60)
Glucose, Bld: 114 mg/dL — ABNORMAL HIGH (ref 70–99)
Potassium: 4 mEq/L (ref 3.5–5.1)
Sodium: 133 mEq/L — ABNORMAL LOW (ref 135–145)

## 2010-04-29 LAB — TSH
TSH: 0.308 u[IU]/mL — ABNORMAL LOW (ref 0.350–4.500)
TSH: 0.463 u[IU]/mL (ref 0.350–4.500)

## 2010-04-29 LAB — CBC
HCT: 42.8 % (ref 36.0–46.0)
Hemoglobin: 14.3 g/dL (ref 12.0–15.0)
MCH: 35 pg — ABNORMAL HIGH (ref 26.0–34.0)
MCHC: 33.4 g/dL (ref 30.0–36.0)
MCV: 104.6 fL — ABNORMAL HIGH (ref 78.0–100.0)
Platelets: 243 10*3/uL (ref 150–400)
RBC: 4.09 MIL/uL (ref 3.87–5.11)
RDW: 12.6 % (ref 11.5–15.5)
WBC: 8.4 10*3/uL (ref 4.0–10.5)

## 2010-04-29 LAB — FOLATE RBC: RBC Folate: 2294 ng/mL — ABNORMAL HIGH (ref >=366)

## 2010-04-29 LAB — FOLATE: Folate: >20 ng/mL

## 2010-04-30 LAB — BASIC METABOLIC PANEL
BUN: 8 mg/dL (ref 6–23)
CO2: 27 mEq/L (ref 19–32)
Calcium: 9.3 mg/dL (ref 8.4–10.5)
Chloride: 100 mEq/L (ref 96–112)
Creatinine, Ser: 0.71 mg/dL (ref 0.4–1.2)
GFR calc Af Amer: >60 mL/min (ref >60)
GFR calc non Af Amer: >60 mL/min (ref >60)
Glucose, Bld: 123 mg/dL — ABNORMAL HIGH (ref 70–99)
Potassium: 3.3 mEq/L — ABNORMAL LOW (ref 3.5–5.1)
Sodium: 135 mEq/L (ref 135–145)

## 2010-04-30 LAB — PROTIME-INR
INR: 2.5 — ABNORMAL HIGH (ref 0.00–1.49)
Prothrombin Time: 27.1 seconds — ABNORMAL HIGH (ref 11.6–15.2)

## 2010-05-01 ENCOUNTER — Telehealth: Payer: Self-pay | Admitting: Cardiovascular Disease

## 2010-05-01 LAB — PROTIME-INR
INR: 2.34 — ABNORMAL HIGH (ref 0.00–1.49)
Prothrombin Time: 25.8 seconds — ABNORMAL HIGH (ref 11.6–15.2)

## 2010-05-01 NOTE — Telephone Encounter (Signed)
Per nurse at [facility] they will follow coumadin dosing while there and inform us when pt is discharged.

## 2010-05-01 NOTE — Telephone Encounter (Signed)
Per nurse at [facility] they will be doing INR and will infofm [place] when pt is discharged.

## 2010-05-02 ENCOUNTER — Encounter: Payer: Medicare Other | Admitting: *Deleted

## 2010-05-02 NOTE — H&P (Signed)
[name], [name]                 ACCOUNT NO.:  [id number]  MEDICAL RECORD NO.:  [id number]          PATIENT TYPE:  LOCATION:                                 FACILITY:  PHYSICIAN:  Conley Canal, MD      DATE OF BIRTH:  [birth year]  DATE OF ADMISSION: DATE OF DISCHARGE:                             HISTORY & PHYSICAL   PRIMARY CARE PHYSICIAN:  Georgina Quint. [name], MD  PRIMARY CARDIOLOGIST:  Dr. Verne Carrow.  ORTHOPEDIC SURGEON:  Georges Lynch. Gioffre, MD  CHIEF COMPLAINT:  Severe neck pain of 3 days duration.  HISTORY OF PRESENT ILLNESS:  [name] is a pleasant 77 year old Caucasian female who has history of atrial fibrillation chronically anticoagulated, degenerative joint disease, osteopenia, microcytosis probably related to medications, hypothyroidism who comes in with complaints of severe neck pain which started about 3 days ago.  She says she has had a busy day in which she did a lot of chores on her wheelchair.  She said that she wake up with severe neck pain which was at the back.  She could hardly move her neck because of the pain, so she came to the emergency room yesterday.  She had CT of the neck, which showed some degenerative changes and she was given some pain medications and discharged to home from the emergency room, however, the pain has continued and its unbearable, hence decision to come back to the emergency room, she denies any chest pain or headache or fever or other symptomatology associated with this pain and she denies history of recent trauma.  However, she mentions that she is supposed to get hip replacement by Dr. Darrelyn Hillock in the next 5-6 weeks.  The patient has been referred to the hospitalist service for management of her pain.  At the time of my evaluation, the patient is noted to be in arterial fibrillation with rapid ventricular response, ventricular rate in the 130s all occasionally.  She is on Toprol XL and Cardizem, but she  last took these medications yesterday.  PAST MEDICAL HISTORY: 1. Atrial fibrillation, chronically anticoagulated. 2. Degenerative joint disease. 3. Hypothyroidism. 4. Microcytosis 5. Previous. 6. Previous history of GI bleeding. 7. Peptic ulcer disease. 8. Chronic back pain. 9. Status post tonsillectomy. 10.Status post back surgery.  HOME MEDICATIONS:  Include; Synthroid, Cardizem, Lopressor, Protonix, vitamin B complex, vitamin D3, Ferrex, vitamin C, Coumadin, hydroxyzine, temazepam, Valium.  ALLERGIES:  Listed include nitrofurantoin, oxycodone, APAP, codeine, atenolol.  SOCIAL HISTORY:  Patient married.  Lives with her husband.  Denies cigarette smoking.  Occasionally drinks a glass of wine.  Denies illicit drugs.  FAMILY HISTORY:  Her father had heart disease.  She is not sure about the details.  REVIEW OF SYSTEMS:  Unremarkable except as highlighted in the history of present illness.  PHYSICAL EXAMINATION:  GENERAL:  This is a frail elderly lady accompanied by family members at bedside. VITAL SIGNS:  Blood pressure 147/96, heart rate 130, temperature 98.4, respirations 20, oxygen saturation is 97% on 2 L nasal cannula. HEAD, EARS, NOSE AND THROAT:  Pupils equal, reacting to light.  Some neck rigidity with a  voluntary elements because of pain, muscle spasms at the back. RESPIRATORY:  Good air entry bilaterally.  No rhonchi or rales. CARDIOVASCULAR:  First and second heart sounds heard, irregular tachycardia. ABDOMEN:  Scaphoid, soft, nontender.  No palpable organomegaly.  Bowel sounds are normal. CNS:  The patient is alert, oriented in person, place and time with no acute focal neurological deficits. EXTREMITIES:  No pedal edema.  Peripheral pulses equal.  LABORATORY DATA:  Labs were reviewed, significant for WBC 9.1, hemoglobin 15.3, hematocrit 46, platelet count 228, MCV 103, neutrophils 82%.  PT 30.6, INR 2.93.  Sodium 136, potassium 3.2, BUN 10,  creatinine 0.91, calcium 9.5, glucose 115.  CT of C-spine and of the neck showed no evidence of dissection of carotid arteries or vertebral arteries and showed moderate calcification and narrowing of the cavernous segment of the internal carotid arteries bilaterally and also showed cervical spondylotic changes with spinal stenosis.  IMPRESSION:  An 77 year old female with atrial fibrillation on Coumadin and osteoarthritis who comes in with intractable neck pain most likely secondary to muscle spasms and degenerative disease.  PLAN: 1. Intractable neck pain.  We will admit the patient to telemetry in     view of the atrial fibrillation with rapid ventricular response and     will give analgesics including morphine, Tylenol as well as muscle     relaxant.  Ask physical therapy to evaluate if she continues to     have neck pain, consider MRI of the neck and consider spinal     surgery evaluation. 2. Atrial fibrillation with rapid ventricular response.  This seems to     be related to patient not taking her medications earlier today.  We     will resume her medications including Lopressor, Cardizem.  While     in the emergency room, we will give Cardizem 5 mg IV x1 and check     cardiac enzymes, EKG.  Plan to correct electrolytes as well and     gently rehydrate. 3. Hypokalemia, probably secondary to poor oral intake.  We will     replenish potassium check, magnesium level. 4. Osteoarthritis.  The patient to have total hip replacement by Dr.     Darrelyn Hillock in the next 5-6 weeks.  Risk stratification done by the     patient's primary care providers is in the outpatient setting. 5. History of peptic ulcer disease.  Resume Protonix. 6. Hypothyroidism.  Continue Synthroid. 7. Patient's condition is fair.     Conley Canal, MD     SR/MEDQ  D:  04/27/2010  T:  04/27/2010  Job:  [id number]  cc:   Verne Carrow, MD [address] [place] [address]  Rachael Fee,  MD [address] [place], [place] [address]  Georges Lynch. Darrelyn Hillock, M.D. Fax: [phone]  Georgina Quint. [name], MD [address] [place] [address]  Electronically Signed by Conley Canal  on 05/02/2010 09:57:13 AM

## 2010-05-08 NOTE — Discharge Summary (Signed)
NAMEELEN, [name]                 ACCOUNT NO.:  [id number]  MEDICAL RECORD NO.:  [id number]           PATIENT TYPE:  I  LOCATION:  1407                         FACILITY:  [facility]  PHYSICIAN:  Clydia Llano, MD       DATE OF BIRTH:  August 19, 1925  DATE OF ADMISSION:  04/27/2010 DATE OF DISCHARGE:                        DISCHARGE SUMMARY - REFERRING   PRIMARY CARE PHYSICIAN:  Georgina Quint. [name], M.D.  CARDIOLOGIST:  Verne Carrow, M.D.  REASON FOR ADMISSION:  Severe neck pain for 3 days' duration.  DISCHARGE DIAGNOSES: 1. Neck pain secondary to muscle spasm. 2. Atrial fibrillation, rapid ventricular response now, rate     controlled. 3. Degenerative joint disease. 4. Hypothyroidism. 5. Macrocytosis. 6. Chronic back pain. 7. Previous history of gastrointestinal bleed. 8. Osteoarthritis/degenerative joint disease.  DISCHARGE MEDICATIONS: 1. Flexeril 5 mg every 8 hours as needed for neck spasm. 2. Tramadol 50 mg every 8 hours as needed for pain. 3. Cardizem CD 240 mg 1 capsule p.o. daily. 4. Hydroxyzine 25 mg 2 capsules daily at bedtime as needed for     itching. 5. Iron supplement 1 tablet p.o. daily. 6. Toprol XL 50 mg p.o. b.i.d. 7. Protonix 40 mg p.o. daily. 8. Synthroid 25 mcg p.o. daily. 9. Temazepam 30 mg daily at bedtime as needed for insomnia. 10.Vitamin B complex with vitamin C OTC 1 tablet p.o. daily. 11.Vitamin D3 is 1000 units 1 tablet p.o. daily. 12.Warfarin 5 mg, take whole tablet on Monday, Wednesday, and Friday     and take half tablet on other days.  RADIOLOGY: 1. Chest x-ray April 27, 2010, showed no acute disease. 2. On April 26, 2010, CT scan of the neck showed no evidence of     dissection of the carotid arteries or vertebral arteries.  There is     moderate calcification and narrowing of the cavernous segment of     the internal carotid arteries.  There is cervical spondylosis with     spinal stenosis.  There is multi level cervical  spondylosis changes     with most prominent C4-C5 level.  BRIEF HISTORY AND EXAMINATION:  Ms. Floren is a pleasant 77 year old Caucasian female with history of chronic atrial fibrillation on anticoagulation.  The patient has complete right bundle branch block. The patient came into the hospital complaining about severe neck pain. The pain started 3 days ago.  She said she had a busy day in which she had a lot of trips on her walker and wheelchair, and by the end of the day, she ended up with severe pain which is increasing when she uses her walker.  The patient mentioned she could hardly move her neck because of the pain.  She came into emergency room one night prior to admission. CT of the neck showed some degenerative changes, was given some pain medication, and discharged home.  The pain continued to be unbearable. The patient decided to come back to the hospital, and [facility] took call after she was found to be also on atrial fibrillation and rapid ventricular response.  BRIEF HOSPITAL COURSE: 1. Neck pain.  The patient  admitted for further evaluation.  At the     time of admission, there is no fever, no chills.  There are no     other meningeal signs.  The patient's CT scan showed moderate     spinal stenosis, most notable at C4-C5 level.  The patient does not     have radicular pain.  The spinal stenosis does not correlate to the     pain she had.  The patient's pain likely is due to muscle spasm.     The patient was provided with a muscle relaxant as well as pain     medication.  The patient's pain and spasm was getting better     slowly.  The patient will be discharged on muscle relaxant as well     as pain medication. 2. Atrial fibrillation with rapid ventricular response.  On the day of     admission, the patient required a Cardizem drip for some time but     it was discontinued on the same day.  The patient's heart rate was     in the 130s and it was thought to be  secondary to her missing her     Toprol XL and Cardizem morning dose.  The patient has right bundle-     branch block in her EKG.  When she has rapid ventricular response,     it seems like wide QRS complex tachycardia but still irregularly     irregular.  Better control was achieved with increasing the dose of     the Cardizem.  The patient is on Coumadin and that was continued     throughout the hospital stay. 3. Hyponatremia.  The patient upon admission has sodium of 136.  The     next day, it was 130.  Because it is likely secondary to     administration of hypotonic half normal saline.  The patient was     started on normal saline and on and her sodium went back to 135. 4. Macrocytosis.  The patient has MCV of 104.6.  Her TSH, folate, and     B12 were normal.  Does not have any evidence of chronic liver     disease from her LFTs.  It is less likely from the medications she     has taken.  The patient is not anemic, the patient to follow up     with her primary care physician. 5. Osteoarthritis and degenerative joint disease.  The patient has     impaired mobility secondary to that.  Said she has appointment with     her orthopedic surgeon for surgery probably in May.  The patient     advised to see her cardiologist before surgery.  The patient     evaluated by Physical Therapy which recommended to discharge to     nursing home if there is no 24-hour supervision at home.  DISCHARGE VITAL SIGNS:  Temperature is 98.1, pulse is 89, respirations 16, blood pressure is 144/80, and O2 sats 96% over 2 L.  DISCHARGE LABORATORY DATA: 1. BMP; sodium 135, potassium 3.3 and 60 mEq of potassium given,     chloride 100, bicarb is 27, glucose 123, BUN is 8, and creatinine     0.7. 2. INR is 2.5.  DISCHARGE INSTRUCTIONS: 1. Disposition to skilled nursing facility. 2. Diet, heart-healthy diet. 3. Activity as tolerated.     Clydia Llano, MD     ME/MEDQ  D:  04/30/2010  T:  04/30/2010   Job:  [id number]  cc:   Georgina Quint. [name], MD [address] [place] [address]  Verne Carrow, MD [address] [place] [address]  Electronically Signed by Clydia Llano  on 05/08/2010 08:18:07 PM

## 2010-05-09 ENCOUNTER — Telehealth: Payer: Self-pay | Admitting: Cardiovascular Disease

## 2010-05-09 ENCOUNTER — Ambulatory Visit (INDEPENDENT_AMBULATORY_CARE_PROVIDER_SITE_OTHER): Payer: Self-pay | Admitting: Internal Medicine

## 2010-05-09 DIAGNOSIS — R002 Palpitations: Secondary | ICD-10-CM

## 2010-05-09 LAB — POCT INR: INR: 2.2

## 2010-05-09 NOTE — Telephone Encounter (Signed)
Pt calling re cardizem has question re strength is double what she was on and doesn't want to take her morning dose until she verifies this

## 2010-05-09 NOTE — Telephone Encounter (Signed)
Patient's Cardizem dose was increased to 240 at the hospital due to afib RVR on admission. She is reluctant to take this dose since she is feeling okay although she was asymptomatic with her RVR. She was due to f/u with [facility] on 05/21/10 so I rescheduled her to tomorrow 05/10/10. I advised her to take her current dose of 120 mg today and tomorrow and we would evaluate her HR tomorrow.

## 2010-05-10 ENCOUNTER — Ambulatory Visit (INDEPENDENT_AMBULATORY_CARE_PROVIDER_SITE_OTHER): Payer: Medicare Other | Admitting: Cardiovascular Disease

## 2010-05-10 ENCOUNTER — Encounter: Payer: Self-pay | Admitting: Cardiovascular Disease

## 2010-05-10 VITALS — BP 108/75 | HR 69 | Ht 63.0 in | Wt 169.0 lb

## 2010-05-10 DIAGNOSIS — Z01810 Encounter for preprocedural cardiovascular examination: Secondary | ICD-10-CM | POA: Insufficient documentation

## 2010-05-10 DIAGNOSIS — I4891 Unspecified atrial fibrillation: Secondary | ICD-10-CM

## 2010-05-10 NOTE — Assessment & Plan Note (Signed)
No signs or symptoms suggestive of angina, CHF. No cardiac workup prior to her planned procedure. She can proceed with surgery as planned.

## 2010-05-10 NOTE — Assessment & Plan Note (Addendum)
Rate controlled. No changes. Continue coumadin. INR per home health.

## 2010-05-10 NOTE — Progress Notes (Signed)
History of Present Illness :77 yo WF with history of HTN and paroxysmal atrial fibrillation who is here today for follow up.  She has had chronic atrial flutter/fibrillation  and has been managed on coumadin and rate control agents. I saw her husband two weeks ago and he told me that she had been doing poorly with anemia, weakness and continued back pain. She had been diagnosed with anemia with dark stools. Coumadin was held and upper endoscopy showed several small clean based gastric ulcers.  No further evidence of GI bleeding. She had several episodes of RVR with her atrial fib in the hospital.  Coumadin has been restarted without evidence of further GI bleeding.   She has had two recent admissions to the hospital for neck and jaw spasms. She was given a muscle relaxant. This pain has resolved. Atrial fib with RVR while in the hospital. She has an upcoming right hip replacement on May 18th with Dr. Darrelyn Hillock. She has recently been in an inpatient rehab center. Her meds were changed in the rehab center. She is unhappy with this and wishes to review with me. She has had no chest pain or SOB. Overall feels well except for hip pain.   Past Medical History  Diagnosis Date  . Paroxysmal atrial fibrillation      on coumadin therapy  . Hypertension   . LBP (low back pain)     severe lumbar spondylosis s/p L3/L4,L4/L5 fusion 12/10  . OA (osteoarthritis)     Dr. Juliene Pina  . Osteopenia   . Elevated MCV   . Constipation     ischemic colitis with rectal bleeding   . B12 deficiency     boderline  . Renal insufficiency   . Hypothyroidism 2011  . NSAID-associated gastropathy 2011    with bleed  . Iron deficiency anemia 2011    elevated MCV  . Peptic ulcer disease     Past Surgical History  Procedure Date  . Tonsillectomy   . Rotator cuff repair 1997    Left  . Back surgery 12/2008    L3/L4/L fusion    Current Outpatient Prescriptions  Medication Sig Dispense Refill  . B Complex Vitamins  (VITAMIN B COMPLEX) CAPS Take 1 capsule by mouth daily.        . Cholecalciferol (VITAMIN D3) 1000 UNITS tablet Take 1,000 Units by mouth daily.        [name] diltiazem (CARDIZEM) 120 MG tablet Take 120 mg by mouth. 1 tab daily       . furosemide (LASIX) 40 MG tablet Take 40 mg by mouth every other day as needed. For swelling       . Iron Polysacch Cmplx-B12-FA (FERREX 150 FORTE) 150-0.025-1 MG CAPS Take 1 capsule by mouth 2 (two) times daily. Take with vit C 250 mg two times daily.      [name] levothyroxine (SYNTHROID, LEVOTHROID) 25 MCG tablet Take 25 mcg by mouth daily. For thyroid       . metoprolol (TOPROL-XL) 50 MG 24 hr tablet Take 50 mg by mouth 2 (two) times daily.        . temazepam (RESTORIL) 30 MG capsule Take 30 mg by mouth daily.        . traMADol (ULTRAM) 50 MG tablet Take 50-100 mg by mouth 2 (two) times daily as needed. For pain       . warfarin (COUMADIN) 5 MG tablet Take by mouth as directed.       [name] buPROPion [facility] SR)  150 MG 12 hr tablet       . cyclobenzaprine (FLEXERIL) 5 MG tablet Take 5 mg by mouth 2 (two) times daily as needed. For spasm       . hydrOXYzine (ATARAX) 25 MG tablet Take 25-50 mg by mouth 2 (two) times daily as needed. For itching       . loratadine (CLARITIN) 10 MG tablet Take 10 mg by mouth daily as needed. For allergies       . DISCONTD: diltiazem (CARDIZEM CD) 120 MG 24 hr capsule Take 120 mg by mouth daily.        [name] DISCONTD: pantoprazole (PROTONIX) 40 MG tablet Take 40 mg by mouth daily.          Allergies  Allergen Reactions  . Atenolol     REACTION: sick  . Clarithromycin     REACTION: nausea  . Codeine Sulfate     REACTION: nausea  . Macrodantin   . Nitrofurantoin   . Percocet (Oxycodone-Acetaminophen)     REACTION: n/v    History   Social History  . Marital Status: Married    Spouse Name: N/A    Number of Children: 2  . Years of Education: N/A   Occupational History  . retired [employer]   Social History Main Topics  .  Smoking status: Never Smoker   . Smokeless tobacco: Not on file  . Alcohol Use: 0.6 oz/week    1 Glasses of wine per week  . Drug Use: No  . Sexually Active:    Other Topics Concern  . Not on file   Social History Narrative  . No narrative on file    Family History  Problem Relation Age of Onset  . COPD Father   . Hypertension Other     Review of Systems:  As stated in the HPI and otherwise negative.   BP 108/75  Pulse 69  Ht 5\' 3"  (1.6 m)  Wt 169 lb (76.658 kg)  BMI 29.94 kg/m2  Physical Examination: General: Well developed, well nourished, NAD HEENT: OP clear, mucus membranes moist SKIN: warm, dry. No rashes. Neuro: No focal deficits Musculoskeletal: Muscle strength 5/5 all ext Psychiatric: Mood and affect normal Neck: No JVD, no carotid bruits, no thyromegaly, no lymphadenopathy. Lungs:Clear bilaterally, no wheezes, rhonci, crackles Cardiovascular: Irregular. No murmurs, gallops or rubs. Abdomen:Soft. Bowel sounds present. Non-tender.  Extremities: No lower extremity edema. Pulses are 2 + in the bilateral DP/PT.  EKG: Atrial fibrillation, rate 83 bpm. RBBB. Diffuse T wave flattening.

## 2010-05-15 ENCOUNTER — Ambulatory Visit (INDEPENDENT_AMBULATORY_CARE_PROVIDER_SITE_OTHER): Payer: Self-pay | Admitting: Internal Medicine

## 2010-05-15 DIAGNOSIS — R0989 Other specified symptoms and signs involving the circulatory and respiratory systems: Secondary | ICD-10-CM

## 2010-05-15 LAB — POCT INR: INR: 2.8

## 2010-05-21 ENCOUNTER — Ambulatory Visit: Payer: Medicare Other | Admitting: Cardiovascular Disease

## 2010-05-22 DIAGNOSIS — I4891 Unspecified atrial fibrillation: Secondary | ICD-10-CM

## 2010-05-22 DIAGNOSIS — M47812 Spondylosis without myelopathy or radiculopathy, cervical region: Secondary | ICD-10-CM

## 2010-05-22 DIAGNOSIS — M1610 Unilateral primary osteoarthritis, unspecified hip: Secondary | ICD-10-CM

## 2010-05-22 DIAGNOSIS — R262 Difficulty in walking, not elsewhere classified: Secondary | ICD-10-CM

## 2010-05-22 DIAGNOSIS — M169 Osteoarthritis of hip, unspecified: Secondary | ICD-10-CM

## 2010-05-24 ENCOUNTER — Other Ambulatory Visit: Payer: Self-pay | Admitting: Internal Medicine

## 2010-05-24 ENCOUNTER — Other Ambulatory Visit (INDEPENDENT_AMBULATORY_CARE_PROVIDER_SITE_OTHER): Payer: Medicare Other

## 2010-05-24 DIAGNOSIS — I10 Essential (primary) hypertension: Secondary | ICD-10-CM

## 2010-05-24 DIAGNOSIS — D509 Iron deficiency anemia, unspecified: Secondary | ICD-10-CM

## 2010-05-24 LAB — HEPATIC FUNCTION PANEL
ALT: 16 U/L (ref 0–35)
AST: 21 U/L (ref 0–37)
Albumin: 3.6 g/dL (ref 3.5–5.2)
Alkaline Phosphatase: 72 U/L (ref 39–117)
Bilirubin, Direct: 0.2 mg/dL (ref 0.0–0.3)
Total Bilirubin: 0.8 mg/dL (ref 0.3–1.2)
Total Protein: 7 g/dL (ref 6.0–8.3)

## 2010-05-24 LAB — BASIC METABOLIC PANEL
BUN: 12 mg/dL (ref 6–23)
CO2: 29 mEq/L (ref 19–32)
Calcium: 9.1 mg/dL (ref 8.4–10.5)
Chloride: 99 mEq/L (ref 96–112)
Creatinine, Ser: 0.9 mg/dL (ref 0.4–1.2)
GFR: 61.26 mL/min (ref >60.00)
Glucose, Bld: 112 mg/dL — ABNORMAL HIGH (ref 70–99)
Potassium: 3.7 mEq/L (ref 3.5–5.1)
Sodium: 137 mEq/L (ref 135–145)

## 2010-05-24 LAB — CBC WITH DIFFERENTIAL/PLATELET
Basophils Absolute: 0 10*3/uL (ref 0.0–0.1)
Basophils Relative: 0.3 % (ref 0.0–3.0)
Eosinophils Absolute: 0.1 10*3/uL (ref 0.0–0.7)
Eosinophils Relative: 1.4 % (ref 0.0–5.0)
HCT: 39.5 % (ref 36.0–46.0)
Hemoglobin: 13.4 g/dL (ref 12.0–15.0)
Lymphocytes Relative: 18.4 % (ref 12.0–46.0)
Lymphs Abs: 1.2 10*3/uL (ref 0.7–4.0)
MCHC: 33.8 g/dL (ref 30.0–36.0)
MCV: 104.9 fl — ABNORMAL HIGH (ref 78.0–100.0)
Monocytes Absolute: 0.7 10*3/uL (ref 0.1–1.0)
Monocytes Relative: 10.9 % (ref 3.0–12.0)
Neutro Abs: 4.4 10*3/uL (ref 1.4–7.7)
Neutrophils Relative %: 69 % (ref 43.0–77.0)
Platelets: 239 10*3/uL (ref 150.0–400.0)
RBC: 3.76 Mil/uL — ABNORMAL LOW (ref 3.87–5.11)
RDW: 14.2 % (ref 11.5–14.6)
WBC: 6.3 10*3/uL (ref 4.5–10.5)

## 2010-05-28 ENCOUNTER — Other Ambulatory Visit: Payer: Self-pay | Admitting: Orthopedic Surgery

## 2010-05-28 ENCOUNTER — Encounter (HOSPITAL_COMMUNITY): Payer: Medicare Other | Attending: Orthopedic Surgery

## 2010-05-28 DIAGNOSIS — M1710 Unilateral primary osteoarthritis, unspecified knee: Secondary | ICD-10-CM | POA: Insufficient documentation

## 2010-05-28 DIAGNOSIS — Z01812 Encounter for preprocedural laboratory examination: Secondary | ICD-10-CM | POA: Insufficient documentation

## 2010-05-28 LAB — COMPREHENSIVE METABOLIC PANEL
ALT: 17 U/L (ref 0–35)
AST: 21 U/L (ref 0–37)
Albumin: 3.8 g/dL (ref 3.5–5.2)
Alkaline Phosphatase: 86 U/L (ref 39–117)
BUN: 13 mg/dL (ref 6–23)
CO2: 32 mEq/L (ref 19–32)
Calcium: 10 mg/dL (ref 8.4–10.5)
Chloride: 99 mEq/L (ref 96–112)
Creatinine, Ser: 0.89 mg/dL (ref 0.4–1.2)
GFR calc Af Amer: >60 mL/min (ref >60)
GFR calc non Af Amer: >60 mL/min (ref >60)
Glucose, Bld: 127 mg/dL — ABNORMAL HIGH (ref 70–99)
Potassium: 4.2 mEq/L (ref 3.5–5.1)
Sodium: 138 mEq/L (ref 135–145)
Total Bilirubin: 0.7 mg/dL (ref 0.3–1.2)
Total Protein: 7.6 g/dL (ref 6.0–8.3)

## 2010-05-28 LAB — URINALYSIS, ROUTINE W REFLEX MICROSCOPIC
Bilirubin Urine: NEGATIVE
Glucose, UA: NEGATIVE mg/dL
Ketones, ur: NEGATIVE mg/dL
Nitrite: POSITIVE — AB
Protein, ur: NEGATIVE mg/dL
Specific Gravity, Urine: 1.012 (ref 1.005–1.030)
Urobilinogen, UA: 1 mg/dL (ref 0.0–1.0)
pH: 7.5 (ref 5.0–8.0)

## 2010-05-28 LAB — DIFFERENTIAL
Basophils Absolute: 0.1 10*3/uL (ref 0.0–0.1)
Basophils Relative: 1 % (ref 0–1)
Eosinophils Absolute: 0.1 10*3/uL (ref 0.0–0.7)
Eosinophils Relative: 1 % (ref 0–5)
Lymphocytes Relative: 23 % (ref 12–46)
Lymphs Abs: 1.6 10*3/uL (ref 0.7–4.0)
Monocytes Absolute: 0.7 10*3/uL (ref 0.1–1.0)
Monocytes Relative: 11 % (ref 3–12)
Neutro Abs: 4.4 10*3/uL (ref 1.7–7.7)
Neutrophils Relative %: 64 % (ref 43–77)

## 2010-05-28 LAB — CBC
HCT: 41.8 % (ref 36.0–46.0)
Hemoglobin: 13.9 g/dL (ref 12.0–15.0)
MCH: 34.8 pg — ABNORMAL HIGH (ref 26.0–34.0)
MCHC: 33.3 g/dL (ref 30.0–36.0)
MCV: 104.8 fL — ABNORMAL HIGH (ref 78.0–100.0)
Platelets: 255 10*3/uL (ref 150–400)
RBC: 3.99 MIL/uL (ref 3.87–5.11)
RDW: 13.2 % (ref 11.5–15.5)
WBC: 6.8 10*3/uL (ref 4.0–10.5)

## 2010-05-28 LAB — URINE MICROSCOPIC-ADD ON

## 2010-05-28 LAB — PROTIME-INR
INR: 2.64 — ABNORMAL HIGH (ref 0.00–1.49)
Prothrombin Time: 28.3 seconds — ABNORMAL HIGH (ref 11.6–15.2)

## 2010-05-28 LAB — APTT: aPTT: 42 seconds — ABNORMAL HIGH (ref 24–37)

## 2010-05-28 LAB — SURGICAL PCR SCREEN
MRSA, PCR: NEGATIVE
Staphylococcus aureus: NEGATIVE

## 2010-05-29 ENCOUNTER — Encounter: Payer: Self-pay | Admitting: Internal Medicine

## 2010-05-30 ENCOUNTER — Encounter: Payer: Self-pay | Admitting: Internal Medicine

## 2010-05-30 ENCOUNTER — Ambulatory Visit (INDEPENDENT_AMBULATORY_CARE_PROVIDER_SITE_OTHER): Payer: Medicare Other | Admitting: Internal Medicine

## 2010-05-30 DIAGNOSIS — L570 Actinic keratosis: Secondary | ICD-10-CM

## 2010-05-30 DIAGNOSIS — M25559 Pain in unspecified hip: Secondary | ICD-10-CM

## 2010-05-30 DIAGNOSIS — I4891 Unspecified atrial fibrillation: Secondary | ICD-10-CM

## 2010-05-30 DIAGNOSIS — N390 Urinary tract infection, site not specified: Secondary | ICD-10-CM | POA: Insufficient documentation

## 2010-05-30 NOTE — Progress Notes (Signed)
Subjective:    Patient ID: [name], female    DOB: [birth year], 77 y.o.   MRN: [id number]  HPI   The patient is here to follow up on chronic LBP,   Recent UTI on abx, depression, anxiety and  moderate  To severe R hip pain symptoms controlled poorly  with medicines and PT exercise.  Review of Systems  HENT: Negative for neck stiffness.   Respiratory: Negative for cough and stridor.   Cardiovascular: Negative for chest pain and leg swelling.  Gastrointestinal: Negative for abdominal pain.  Musculoskeletal: Positive for back pain, arthralgias (R buttock) and gait problem. Negative for joint swelling.  Neurological: Positive for weakness. Negative for dizziness.  Psychiatric/Behavioral: Positive for dysphoric mood. Negative for suicidal ideas and sleep disturbance. The patient is nervous/anxious.    Wt Readings from Last 3 Encounters:  05/30/10 173 lb (78.472 kg)  05/10/10 169 lb (76.658 kg)  04/08/10 175 lb (79.379 kg)   Past Medical History  Diagnosis Date  . Paroxysmal atrial fibrillation      on coumadin therapy  . Hypertension   . LBP (low back pain)     severe lumbar spondylosis s/p L3/L4,L4/L5 fusion 12/10  . OA (osteoarthritis)     Dr. Juliene Pina  . Osteopenia   . Elevated MCV   . Constipation     ischemic colitis with rectal bleeding   . B12 deficiency     boderline  . Renal insufficiency   . Hypothyroidism 2011  . NSAID-associated gastropathy 2011    with bleed  . Iron deficiency anemia 2011    elevated MCV  . Peptic ulcer disease    Past Surgical History  Procedure Date  . Tonsillectomy   . Rotator cuff repair 1997    Left  . Back surgery 12/2008    L3/L4/L fusion  . Spine surgery     reports that she has never smoked. She does not have any smokeless tobacco history on file. She reports that she drinks about 4.2 ounces of alcohol per week. She reports that she does not use illicit drugs. family history includes Cancer in her father and Hypertension  in her other. Allergies  Allergen Reactions  . Atenolol     REACTION: sick  . Clarithromycin     REACTION: nausea  . Codeine Sulfate     REACTION: nausea  . Macrodantin   . Nitrofurantoin   . Percocet (Oxycodone-Acetaminophen)     REACTION: n/v       Objective:   Physical Exam  Constitutional: She appears well-developed and well-nourished. No distress.  HENT:  Head: Normocephalic.  Right Ear: External ear normal.  Left Ear: External ear normal.  Nose: Nose normal.  Mouth/Throat: Oropharynx is clear and moist.  Eyes: Conjunctivae are normal. Pupils are equal, round, and reactive to light. Right eye exhibits no discharge. Left eye exhibits no discharge.  Neck: Normal range of motion. Neck supple. No JVD present. No tracheal deviation present. No thyromegaly present.  Cardiovascular: Normal rate, regular rhythm and normal heart sounds.   Pulmonary/Chest: No stridor. No respiratory distress. She has no wheezes.  Abdominal: Soft. Bowel sounds are normal. She exhibits no distension and no mass. There is no tenderness. There is no rebound and no guarding.  Musculoskeletal: She exhibits tenderness (R hip is tender; she has to use a walker). She exhibits no edema.  Lymphadenopathy:    She has no cervical adenopathy.  Neurological: She displays normal reflexes. No cranial nerve deficit. She exhibits normal  muscle tone. Coordination normal.  Skin: No rash noted. No erythema.  Psychiatric: She has a normal mood and affect. Her behavior is normal. Judgment and thought content normal.          Assessment & Plan:  ATRIAL FIBRILLATION    She has had chronic atrial flutter/fibrillation  and has been managed on coumadin and rate control agents. I saw her husband two weeks ago and he told me that she had been doing poorly with anemia, weakness and continued back pain. She had been diagnosed with anemia with dark stools. Coumadin was held and upper endoscopy showed several small clean based  gastric ulcers.  No further evidence of GI bleeding. She had several episodes of RVR with her atrial fib in the hospital.  Coumadin has been restarted without evidence of further GI bleeding.    She has had two recent admissions to the hospital for neck and jaw spasms. She was given a muscle relaxant. This pain has resolved. Atrial fib with RVR while in the hospital. She has an upcoming right hip replacement on May 18th with Dr. Darrelyn Hillock. She has recently been in an inpatient rehab center. Her meds were changed in the rehab center. She is unhappy with this and wishes to review with me. She has had no chest pain or SOB. Overall feels well except for hip pain.       HIP PAIN R hip Surgery pending 5/18  UTI (lower urinary tract infection) On Cipro now  Actinic keratoses L LE x 3  Procedure Note :     Procedure : Cryosurgery   Indication:    Actinic keratosis(es)   Risks including unsuccessful procedure , bleeding, infection, bruising, scar, a need for a repeat  procedure and others were explained to the patient in detail as well as the benefits. Informed consent was obtained verbally.    3 lesion(s)  on   L shin was/were treated with liquid nitrogen on a Q-tip in a usual fasion . Band-Aid was applied and antibiotic ointment was given for a later use.   Tolerated well. Complications none.   Postprocedure instructions :     Keep the wounds clean. You can wash them with liquid soap and water. Pat dry with gauze or a Kleenex tissue  Before applying antibiotic ointment and a Band-Aid.   You need to report immediately  if  any signs of infection develop.

## 2010-05-30 NOTE — Assessment & Plan Note (Signed)
L LE x 3  Procedure Note :     Procedure : Cryosurgery   Indication:  Wart(s)  Actinic keratosis(es)   Risks including unsuccessful procedure , bleeding, infection, bruising, scar, a need for a repeat  procedure and others were explained to the patient in detail as well as the benefits. Informed consent was obtained verbally.     lesion(s)  on    was/were treated with liquid nitrogen on a Q-tip in a usual fasion . Band-Aid was applied and antibiotic ointment was given for a later use.   Tolerated well. Complications none.   Postprocedure instructions :     Keep the wounds clean. You can wash them with liquid soap and water. Pat dry with gauze or a Kleenex tissue  Before applying antibiotic ointment and a Band-Aid.   You need to report immediately  if  any signs of infection develop.

## 2010-05-30 NOTE — Assessment & Plan Note (Signed)
    She has had chronic atrial flutter/fibrillation  and has been managed on coumadin and rate control agents. I saw her husband two weeks ago and he told me that she had been doing poorly with anemia, weakness and continued back pain. She had been diagnosed with anemia with dark stools. Coumadin was held and upper endoscopy showed several small clean based gastric ulcers.  No further evidence of GI bleeding. She had several episodes of RVR with her atrial fib in the hospital.  Coumadin has been restarted without evidence of further GI bleeding.    She has had two recent admissions to the hospital for neck and jaw spasms. She was given a muscle relaxant. This pain has resolved. Atrial fib with RVR while in the hospital. She has an upcoming right hip replacement on May 18th with Dr. Darrelyn Hillock. She has recently been in an inpatient rehab center. Her meds were changed in the rehab center. She is unhappy with this and wishes to review with me. She has had no chest pain or SOB. Overall feels well except for hip pain.

## 2010-05-30 NOTE — Assessment & Plan Note (Signed)
R hip Surgery pending 5/18

## 2010-05-30 NOTE — Assessment & Plan Note (Signed)
On Cipro now  

## 2010-05-31 ENCOUNTER — Encounter: Payer: Self-pay | Admitting: Internal Medicine

## 2010-06-04 ENCOUNTER — Other Ambulatory Visit: Payer: Medicare Other

## 2010-06-07 ENCOUNTER — Inpatient Hospital Stay (HOSPITAL_COMMUNITY): Payer: Medicare Other

## 2010-06-07 ENCOUNTER — Inpatient Hospital Stay (HOSPITAL_COMMUNITY)
Admission: RE | Admit: 2010-06-07 | Discharge: 2010-06-12 | DRG: 470 | Disposition: A | Discharge status: Skilled Nursing Facility | Payer: Medicare Other | Source: Ambulatory Visit | Attending: Orthopedic Surgery | Admitting: Orthopedic Surgery

## 2010-06-07 DIAGNOSIS — I4891 Unspecified atrial fibrillation: Secondary | ICD-10-CM | POA: Diagnosis present

## 2010-06-07 DIAGNOSIS — M1610 Unilateral primary osteoarthritis, unspecified hip: Secondary | ICD-10-CM | POA: Diagnosis present

## 2010-06-07 DIAGNOSIS — E871 Hypo-osmolality and hyponatremia: Secondary | ICD-10-CM | POA: Diagnosis not present

## 2010-06-07 DIAGNOSIS — Z7901 Long term (current) use of anticoagulants: Secondary | ICD-10-CM

## 2010-06-07 DIAGNOSIS — M87059 Idiopathic aseptic necrosis of unspecified femur: Principal | ICD-10-CM | POA: Diagnosis present

## 2010-06-07 DIAGNOSIS — Z01812 Encounter for preprocedural laboratory examination: Secondary | ICD-10-CM

## 2010-06-07 DIAGNOSIS — M169 Osteoarthritis of hip, unspecified: Secondary | ICD-10-CM | POA: Diagnosis present

## 2010-06-07 DIAGNOSIS — E876 Hypokalemia: Secondary | ICD-10-CM | POA: Diagnosis not present

## 2010-06-07 DIAGNOSIS — I10 Essential (primary) hypertension: Secondary | ICD-10-CM | POA: Diagnosis present

## 2010-06-07 HISTORY — PX: TOTAL HIP ARTHROPLASTY: SHX124

## 2010-06-07 LAB — APTT: aPTT: 29 seconds (ref 24–37)

## 2010-06-07 LAB — HEMOGLOBIN AND HEMATOCRIT, BLOOD
HCT: 40.4 % (ref 36.0–46.0)
Hemoglobin: 13.8 g/dL (ref 12.0–15.0)

## 2010-06-07 LAB — TYPE AND SCREEN
ABO/RH(D): O POS
Antibody Screen: NEGATIVE

## 2010-06-07 LAB — PROTIME-INR
INR: 1.22 (ref 0.00–1.49)
Prothrombin Time: 15.6 seconds — ABNORMAL HIGH (ref 11.6–15.2)

## 2010-06-07 LAB — ABO/RH: ABO/RH(D): O POS

## 2010-06-07 NOTE — Discharge Summary (Signed)
NAME:  [name], [name]                           ACCOUNT NO.:  [id number]   MEDICAL RECORD NO.:  [id number]                   PATIENT TYPE:  INP   LOCATION:  5522                                 FACILITY:  [facility]   PHYSICIAN:  Cornell Barman, P.A. [facility]            DATE OF BIRTH:  16-Jan-1926   DATE OF ADMISSION:  12/04/2001  DATE OF DISCHARGE:  12/10/2001                                 DISCHARGE SUMMARY   DISCHARGE DIAGNOSES:  1. Hematochezia.  2. Left lower quadrant pain.  3. Hypokalemia.  4. Macrocytic anemia.   BRIEF ADMISSION HISTORY:  The patient is a 77 year old white female who was  in her usual state of health until early this morning when she had a large  bowel movement with blood in it. She continued to have bright red blood per  rectum through the day and has had some cramping and lower abdominal pain.   PAST MEDICAL HISTORY:  1. Osteoarthritis.  2. Hypertension.  3. Rotator cuff surgery.  4. Osteoporosis.   HOSPITAL COURSE:  1. GI.  The patient presented with a lower GI bleed.  GI was asked to see     the patient.  Dr. Marina Goodell initially saw the patient and felt that her     symptoms were consistent with acute ischemic colitis.  He recommended     good IV hydration.  As her symptoms improved, colonoscopy was scheduled.     She underwent colonoscopy on 12/09/01.  This revealed ischemic colitis,     diverticulosis and internal hemorrhoids. The patient's symptoms had     resolved.  It was felt that she was stable for discharge  home from a GI     standpoint.  They recommended that she stay on a low residue diet for the     next two weeks.  The patient has been given information regarding a low     residue diet.   1. Macrocytic anemia.  Hemodynamically stable.  B12 and folate were normal.   1. Hypertension.  This is stable.   1. Hypokalemia.  Corrected with oral supplementation.   LABS AT DISCHARGE:  B12 is 308.  Folate was 13.4.  Sodium was 134, potassium  3.6.   Hemoglobin 10.3, MCV was 103.1.  LFTs were normal.    MEDICATIONS AT DISCHARGE:  She is instructed to resume her home medications  including Tarka 4/240 mg q.d., triamterene, HCTZ 37/25 mg q.d. and  ranitidine, Celebrex, Fosamax, temazepam and aspirin as at home.  She is to  follow a low residue for the next 10 to 14 days.  Follow up with Dr.  Posey Rea in two to three weeks and Dr. Russella Dar as needed.  Cornell Barman, P.A. [facility]    LC/MEDQ  D:  12/10/2001  T:  12/10/2001  Job:  [id number]   cc:   Venita Lick. Pleas Koch., M.D. [facility]   Aleksei V. [name], M.D. [facility]  [address]  [place]  [place] [address]  Fax: 1

## 2010-06-07 NOTE — Assessment & Plan Note (Signed)
[facility]   NAME:  [name], [name]                 ACCOUNT NO.:  [id number]   MEDICAL RECORD NO.:  [id number]      DATE OF BIRTH:  [birth year]   PHYSICIAN:  Theresia Majors. Tanda Rockers, M.D. VISIT DATE:  06/13/2005                                     OFFICE VISIT   [name] is a 77 year old lady who presents for followup of a stasis ulcer  on the right lateral leg.  In the interim, she has been wearing a multi-  layer compression wrap.  She denies pain or increased drainage.   OBJECTIVE:  Vital signs are stable.  She is afebrile.  Blood pressure is  136/82.  Respirations are 20, pulse rate 68, and temperature 97.9.  Inspection of the lower extremity shows there is 2+ edema and the wound  itself has a 100% granulating base with evidence of epithelium at the  periphery.  There is no malodor.  There are areas of necrosis and  desquamated nonviable tissue.  These were debrided off to help the bleeding  tissue.  The patient was placed in an Unaboot.   ASSESSMENT:  Improved.   PLAN:  We will see the patient in 1 week for followup.           ______________________________  Theresia Majors. Tanda Rockers, M.D.     [name]  D:  06/13/2005  T:  06/14/2005  Job:  [id number]

## 2010-06-07 NOTE — Assessment & Plan Note (Signed)
[facility]   NAME:  [name], [name]                 ACCOUNT NO.:  [id number]   MEDICAL RECORD NO.:  [id number]      DATE OF BIRTH:  1925-06-06   PHYSICIAN:  Theresia Majors. Tanda Rockers, M.D. VISIT DATE:  07/14/2005                                     OFFICE VISIT   PURPOSE OF TODAY'S VISIT:  [name] returns for a followup of a stasis  ulcer of the lateral right lower extremity, wound number 1.  In the interim  she has worn an Radio broadcast assistant.  She reports decreased drainage, no pain and no  fever.   OBJECTIVE:  VITAL SIGNS:  Her vital signs are stable.  DERMATOLOGIC:  The Unna boot has been removed disclosing a dramatic decrease  in the diameter of the wound.  The wound has been photographed and entered  into the Wound Expert.  A new dome's Unna boot has been replaced.   ASSESSMENT:  Improvement.   PLAN:  We will follow the patient up in 10 days to reassess her response to  compressive therapy.           ______________________________  Theresia Majors. Tanda Rockers, M.D.     [name]  D:  07/14/2005  T:  07/14/2005  Job:  [id number]

## 2010-06-07 NOTE — Assessment & Plan Note (Signed)
[facility]   NAME:  [name], [name]                 ACCOUNT NO.:  [id number]   MEDICAL RECORD NO.:  [id number]      DATE OF BIRTH:  Apr 12, 1925   PHYSICIAN:  Theresia Majors. Tanda Rockers, M.D. VISIT DATE:  08/04/2005                                     OFFICE VISIT   SUBJECTIVE:  [name] returns for followup of the stasis ulcer of the  lateral right lower extremity. In the interim, she has had minimum drainage.  There has been no pain or no fever.   OBJECTIVE:  VITAL SIGNS:  Blood pressure is 120/75, respiratory rate 20,  pulse rate 68, and she is afebrile  EXTREMITIES:  Inspection of the lower extremity shows that the stasis ulcer  has significant in-growth of epithelialization with 2 punctate areas of  granulation. The wound is near healed at this point with no evidence of  excessive exudates or drainage.   ASSESSMENT:  Improved stasis ulcer.   PLAN:  The patient was placed in an Una wrap. We will re-evaluate her in 10  days. We have inspected her support hose, which are 15 to 20 mmHg  compression. Although these are of a lesser compression than we usually  prescribe, we will permit the patient to utilize these early on and suggest  that she be re-evaluated to assess their compression capability and  adequacy. We will re-evaluate the patient in 10 days.           ______________________________  Theresia Majors Tanda Rockers, M.D.     [name]  D:  08/04/2005  T:  08/04/2005  Job:  [id number]

## 2010-06-07 NOTE — H&P (Signed)
NAME:  ALMAROSA, BOHAC                           ACCOUNT NO.:  [id number]   MEDICAL RECORD NO.:  [id number]                   PATIENT TYPE:  INP   LOCATION:  1828                                 FACILITY:  [facility]   PHYSICIAN:  Valetta Mole. Swords, M.D. [facility]           DATE OF BIRTH:  08/13/1925   DATE OF ADMISSION:  12/04/2001  DATE OF DISCHARGE:                                HISTORY & PHYSICAL   CHIEF COMPLAINT:  Rectal bleeding.   HISTORY OF PRESENT ILLNESS:  The patient is a 77 year old female in her  usual state of health until early this morning.  Approximately 4 o'clock  this morning she had significant straining while going to the bathroom.  At  6 o'clock she had another large bowel movement followed by significant  amount of bleeding.  Throughout the day she has continued to have bright red  blood per rectum.  She describes multiple bowel movements that have been  just blood.  No significant clots and it is difficult for her to quantitate  the amount of blood.  She has had some abdominal cramping today over her  midabdomen bilateral quadrants.  She denies any light-headedness.  She  denies any syncope.  She denies any unusual foods.  She denies any fevers or  chills or recent diarrhea.   PAST MEDICAL HISTORY:  Her past medical history is significant for  osteoarthritis, also hypertension.  She has had rotator cuff surgery and she  has recently been diagnosed with either osteoporosis or osteopenia.   MEDICATIONS:  Her medications include Tarka 4/240 one p.o. q.d.,  triamterine/hydrochlorothiazide 37.5/25 p.o. q.d.  She takes Celebrex  unknown dose, ranitidine 75 mg p.o. q.d. p.r.n., temazepam q.h.s., and  Fosamax weekly.  She also has been taking aspirin a day.   SOCIAL HISTORY:  She does not use tobacco.  She uses alcohol occasionally.  She is married.   FAMILY HISTORY:  Mother deceased with pneumonia at 35.  Father deceased with  lung cancer at 37.   REVIEW OF SYSTEMS:  She  denies any chest pain, shortness of breath, PND,  orthopnea.  She denies any hematemesis.  She denies any lower extremity  swelling, rashes, neurologic deficits.  She denies any other complaints on  review of systems.   PHYSICAL EXAMINATION:  VITAL SIGNS: Blood pressure 168/69, heart rate 76,  temperature 97.3, respirations 18.  GENERAL: She appears as an elderly female in no acute distress.  SKIN: Pink.  HEENT: Atraumatic, normocephalic; extraocular muscles are intact;  conjunctivae are pink and moist.  NECK: Supple without lymphadenopathy, thyromegaly, jugular venous distention  or carotid bruits.  CHEST: Clear to auscultation without any increased work of breathing.  CARDIAC: S1 and S2 are normal without murmurs or gallops.  ABDOMEN: Active bowel sounds, soft, nontender; there is no  hepatosplenomegaly and no masses are palpated.  RECTAL: Previous rectal exam was positive for blood per ED  staff.  EXTREMITIES: There is no clubbing, cyanosis, or edema.  NEUROLOGICAL: She is alert and oriented.   LABORATORIES:  White count 11.4, hemoglobin 14.9, platelet count 181,000.  INR is 1.5.   ASSESSMENT AND PLAN:  Gastrointestinal bleed.  Currently her hemoglobin is  greater than 14.  I suspect she has had a diverticular bleed.  I do not  think she has any evidence of mesenteric ischemia.  We will observe for now  and continue to monitor her CBC.  We will check a CMET.  We will consult  gastrointestinal if her bleeding continues.  She will be placed on  intravenous fluids.  Her medications will be continued except for the  Celebrex and aspirin.                                               Bruce Rexene Edison Swords, M.D. [facility]    BHS/MEDQ  D:  12/04/2001  T:  12/04/2001  Job:  [id number]   cc:   Georgina Quint. [name], M.D. [facility]   Barry Dienes. Eloise Harman, M.D.

## 2010-06-07 NOTE — Assessment & Plan Note (Signed)
[facility]   NAME:  [name], [name]                 ACCOUNT NO.:  [id number]   MEDICAL RECORD NO.:  [id number]      DATE OF BIRTH:  08-28-1925   PHYSICIAN:  Theresia Majors. Tanda Rockers, M.D. VISIT DATE:  07/24/2005                                     OFFICE VISIT   SUBJECTIVE:  [name] returns for followup of the stasis ulcer on the  lateral aspect of her right lower extremity.  In the interim, she denies  fever.  He drainage has decreased significantly.  There has been no pain.   OBJECTIVE:  VITAL SIGNS:  Her vital signs are stable.  She is afebrile.  RIGHT EXTREMITY:  Inspection of the wound shows that there are areas  desquamation and necrosis at the periphery.  There is clear advancement of  epithelium.  The wound was full-thickness debrided with hemorrhage control  with direct pressure.  There remains a 2+ edema and chronic change of  stasis.   ASSESSMENT:  Improved stasis ulcer.  No evidence of gross findings  consistent with malignancy.   PLAN:  We will continue the Unna boot wrap for 10 days and re-evaluate her  at the end of that time.           ______________________________  Theresia Majors Tanda Rockers, M.D.     [name]  D:  07/24/2005  T:  07/24/2005  Job:  [id number]

## 2010-06-07 NOTE — Assessment & Plan Note (Signed)
[facility]   NAME:  [name], [name]                 ACCOUNT NO.:  [id number]   MEDICAL RECORD NO.:  [id number]      DATE OF BIRTH:  08-27-1925   PHYSICIAN:  Theresia Majors. Tanda Rockers, M.D. VISIT DATE:  08/14/2005                                     OFFICE VISIT   VITAL SIGNS:  Blood pressure 125/70, respirations 22, pulse rate 70, and she  is afebrile.   PURPOSE OF TODAY'S VISIT:  Ms. Weatherholtz returns for followup of a right  lateral leg ulceration.   WOUND EXAM:  Inspection of the lower extremity shows a persistence of 1-2+  edema.  The ulcer is 90% resolved.  There is a single bud of granulation  tissue in the mid portion of the wound with some desquamation in the  periphery.  This area has been debrided with minimum hemorrhage controlled  with direct pressure.   WOUND SINCE LAST VISIT:  In the interim she has worn an Radio broadcast assistant with  decreased drainage and no pain.  She denies fever.   DIAGNOSIS:  Continued improvement of the wound with decreased area and  volume.   MANAGEMENT PLAN & GOAL:  We will place a Select Silver swath over the wound  and replace the Unna boot.  We will reevaluate the patient in 10 days.           ______________________________  Theresia Majors Tanda Rockers, M.D.     [name]  D:  08/14/2005  T:  08/14/2005  Job:  [id number]

## 2010-06-07 NOTE — Assessment & Plan Note (Signed)
[facility]   NAME:  [name], [name]                 ACCOUNT NO.:  [id number]   MEDICAL RECORD NO.:  [id number]      DATE OF BIRTH:  Dec 07, 1925   PHYSICIAN:  Theresia Majors. Tanda Rockers, M.D. VISIT DATE:  06/20/2005                                     OFFICE VISIT   [name] returns for followup of a stasis ulcer on her right lower  extremity.  In the interim, she has been managed with a multi-level  compression wrap.  She denies fever.  There has been some moderate drainage  but no malodor.   OBJECTIVE:  VITAL SIGNS:  Blood pressure 130/60, pulse rate 58, respirations  14.  She is afebrile.  SKIN:  Following removal of the compression wrap, there has been marked  improvement of the wound.  There is a 100% granulated base with minimum  exudate.  There is essentially no reactive hyperemia.  There is minimum  tenderness.   ASSESSMENT:  Improved.   PLAN:  We will continue the patient with an Unna boot wrap and a __________.  The patient has advised me that she and her husband will be leaving for a  two week excursion to [facility].  We have instructed both the patient  and her husband that the Unna wrap can become soiled, can become malodorous  and cosmetically unacceptable.  We have instructed them in the process of  removing that Unna boot if this occurs, to take a regular shower and to use  an Ace bandage with cotton and 4x4s as the dressing.  We have also indicated  that in some cases, an Unna boot will last for two weeks but normally we  like to change the [employer] in 7-10 days.  They seem to understand and  indicate that they will be compliant.  We have taken additional time to  explain the possibility of edema associated with prolonged standing and  walking, which again would necessitate removal of the Unna boot and  reversion of use of the Ace bandage and the 4x4s.  We will see them in two  weeks.           ______________________________  Theresia Majors Tanda Rockers,  M.D.     [name]  D:  06/20/2005  T:  06/20/2005  Job:  [id number]

## 2010-06-07 NOTE — Assessment & Plan Note (Signed)
[facility]   NAME:  [name], [name]                 ACCOUNT NO.:  [id number]   MEDICAL RECORD NO.:  [id number]      DATE OF BIRTH:  Feb 22, 1925   PHYSICIAN:  Theresia Majors. Tanda Rockers, M.D. VISIT DATE:  08/25/2005                                     OFFICE VISIT   SUBJECTIVE:  [name] returns for follow-up of a right lower extremity  stasis ulcer.  She has been followed for several weeks per Neomia Dear boot  protocol.  She reports that there has been no drainage, no significant pain.   VITAL SIGNS:  Stable.  Afebrile.  Blood pressure 150/78, respiratory rate  18, pulse rate 78, temperature 98.   PURPOSE OF TODAY'S VISIT:   WOUND EXAM:  After removal of her Una wrap there has been complete healing  of the wound.  There is trace edema.   WOUND SINCE LAST VISIT:   CHANGE IN INTERVAL MEDICAL HISTORY:   DIAGNOSIS:   TREATMENT:   ANESTHETIC USED:   TISSUE DEBRIDED:   LEVEL:   CHANGE IN MEDS:   COMPRESSION BANDAGE:   OTHER:   ASSESSMENT:  Resolved wound.   MANAGEMENT PLAN & GOAL:  We have instructed [name] in the use of below-  knee compression hose.  She has had a prescription and has procured  bilateral 30 to 40 mm below-knee compression hose.  She will continue to  wear there.  We are discharging her to the care of her primary care  physician, Dr. Posey Rea.  We will sent copies of her resolved wound final  note and photographs to Drs. Danella Deis and Gold, respectively.           ______________________________  Theresia Majors Tanda Rockers, M.D.     [name]  D:  08/25/2005  T:  08/25/2005  Job:  [id number]   cc:   Georgina Quint. [name], M.D. [facility] M. Danella Deis, M.D.

## 2010-06-07 NOTE — Consult Note (Signed)
NAMEKILEA, MCCAREY                 ACCOUNT NO.:  [id number]   MEDICAL RECORD NO.:  [id number]          PATIENT TYPE:  REC   LOCATION:  FOOT                         FACILITY:  [facility]   PHYSICIAN:  Theresia Majors. Tanda Rockers, M.D.DATE OF BIRTH:  10-25-1925   DATE OF CONSULTATION:  06/06/2005  DATE OF DISCHARGE:                                   CONSULTATION   CONSULTING PHYSICIAN:  Theresia Majors. Tanda Rockers, M.D.   REASON FOR CONSULTATION:  Miss Cleverly is a 77 year old lady with a non-  healing ulceration on the lateral right lower extremity.   IMPRESSION:  Atypical ulceration with a history of squamous cell carcinoma  in situ, status post 5-FU topical treatment.   RECOMMENDATIONS:  Proceed with standard wound care to ensure reduction of  bacterial load if any and apply mild compression.  Serial exams and if there  appears to be no improvement in the wound, it will be recommended the  patient undergo a second biopsy to rule out recurrence or persistent CIS.   SUBJECTIVE:  Miss Portales is a 77 year old lady who has been in reasonable  health.  She has been followed by Dr. Posey Rea for several years.  She has  had this carcinoma in situ diagnosed, in February 2007, and underwent  topical therapy.  She had a slough local reaction resulted in an ulceration.  This ulceration has persisted in spite of aggressive wound care per her  dermatologist.  The patient seeks a second opinion from the wound center.   PAST MEDICAL HISTORY:  Remarkable for myalgias.   She reports allergies to:  1.  MACRODANTIN.  2.  CODEINE.  3.  ATENOLOL.   Her current medication list includes:  Celebrex, ranitidine, calcium, Tarka,  __________, triamcinolone, HCTZ, folate 800 mg daily, vitamin B and folic  acid 2 mg daily, and Ultram.   PAST SURGERIES:  Included a rotator cuff repair in 1997.   FAMILY HISTORY:  Is positive for hypertension and diabetes.   Socially, she is married.  She lives in [place] with her  husband.  She is retired.   REVIEW OF SYSTEMS:  Is specifically negative for weight loss, heat or cold  intolerance, chest pain, hemoptysis, or orthopnea.  She was hospitalized  several months ago for lower GI bleeding which was attributed to ischemic  colitis.  This has spontaneously resolve.  She has had no problems since  then.  She has been monitored routinely since that episode and has been no  areas of concern.  The remainder review of systems is negative.   PHYSICAL EXAMINATION:  GENERAL:  She is an alert oriented female in no acute  distress.  VITAL SIGNS:  Stable.  She is afebrile, blood pressure is 134/72.  HEENT:  Clear.  NECK:  Supple.  Carotid bruits are not appreciated.  Trachea is midline.  LUNGS:  Clear.  HEART:  Sound are normal.  ABDOMEN:  Soft.  EXTREMITIES:  Remarkable for the presence of petechiae throughout both lower  extremities.  The patient reports that this is related to recent antibiotic  which she has since  discontinued.  On the right lateral lower extremity  there is a 6.8 x 3.4 x 0.5 full-thickness ulcer which has a chronic  appearance with a halo of erythema and scant drainage.  There is a moderate  amount of collagenous exudate which is well adherent.  The pedal pulses are  2+.  Capillary refill is normal.  NEUROLOGIC:  The patient is neurologically intact.   The old pathology report has read been retrieved, dated December 31, 2004,  and it showed a right lateral lower leg squamous cell carcinoma in situ.  We  discussed the past treatment of the CIS with Dr. Emily Filbert.  And, we have  mutually agree that we should proceed with a decrease in the possible  bacterial load in  this wound, apply mild compression, and if we see no  evidence of improvement a secondary biopsy would be in order.  We have  discussed this approach to the patient and husband in terms that they seem  to understand.   Currently, our plan is to:  1.  Treat her with a topical Prisma  dressing and a compression wrap.  2.  To re-evaluate her in a week and if we do not see evidence of      progressive shrinkage and decrease in size, we will consider a re-biopsy      of the wound.   The patient and husband seem to be satisfied with this explanation.  They  both express gratitude for having been seen in the clinic and indicate that  they will be compliant as per above.           ______________________________  Theresia Majors. Tanda Rockers, M.D.     [name]  D:  06/06/2005  T:  06/07/2005  Job:  [id number]   cc:   Georgina Quint. [name], M.D. [facility]  [address]  [place]  [place] [address]   Hope M. Danella Deis, M.D.  Fax: [phone]   Emily Filbert, MD

## 2010-06-07 NOTE — Assessment & Plan Note (Signed)
[facility]   NAME:  [name], [name]                 ACCOUNT NO.:  [id number]   MEDICAL RECORD NO.:  [id number]          DATE OF BIRTH:   PHYSICIAN:  Theresia Majors. Tanda Rockers, M.D. VISIT DATE:  07/04/2005                                     OFFICE VISIT   HISTORY OF PRESENT ILLNESS:  Ms. Curtiss returns for follow up of a right  lateral stasis ulcer.  During the interim, she and her husband have  completed an excursion to the [address] and the [facility].  She  had been ambulatory.  She has had no difficulty with keeping her wrap intact  and dry.  She was not troubled with extreme secretions.   OBJECTIVE:  VITAL SIGNS:  Her vital signs are stable.  She is afebrile.  GENERAL:  The wrap has been removed, and there has been decrease in the  diameter of the wound.  The wound was photographed and entered into the  wound expert.  There are areas of desquamated nonviable skin in the middle  of the wound.  This was debrided sharply with hemorrhage control with direct  pressure.  There is advancement of epithelium from the periphery.  It  appears to be normal in appearance.   ASSESSMENT:  Improved stasis ulcer.   PLAN:  The patient was replaced in an Radio broadcast assistant by the physician.  We will  reevaluate the patient in 10-14 days.           ______________________________  Theresia Majors Tanda Rockers, M.D.     [name]  D:  07/04/2005  T:  07/04/2005  Job:  [id number]

## 2010-06-07 NOTE — Assessment & Plan Note (Signed)
[facility]   NAME:  [name], [name]                 ACCOUNT NO.:  [id number]   MEDICAL RECORD NO.:  [id number]      DATE OF BIRTH:  1925/01/24   PHYSICIAN:  Theresia Majors. Tanda Rockers, M.D.      VISIT DATE:                                     OFFICE VISIT   There is no dictation for this job number.           ______________________________  Theresia Majors Tanda Rockers, M.D.     [name]  D:  07/14/2005  T:  07/14/2005  Job:  [id number]

## 2010-06-08 ENCOUNTER — Inpatient Hospital Stay (HOSPITAL_COMMUNITY): Payer: Medicare Other

## 2010-06-08 LAB — URINALYSIS, DIPSTICK ONLY
Bilirubin Urine: NEGATIVE
Glucose, UA: NEGATIVE mg/dL
Hgb urine dipstick: NEGATIVE
Ketones, ur: NEGATIVE mg/dL
Nitrite: NEGATIVE
Protein, ur: NEGATIVE mg/dL
Specific Gravity, Urine: 1.02 (ref 1.005–1.030)
Urobilinogen, UA: 1 mg/dL (ref 0.0–1.0)
pH: 6 (ref 5.0–8.0)

## 2010-06-08 LAB — HEMOGLOBIN AND HEMATOCRIT, BLOOD
HCT: 30.7 % — ABNORMAL LOW (ref 36.0–46.0)
HCT: 28 % — ABNORMAL LOW (ref 36.0–46.0)
Hemoglobin: 10.2 g/dL — ABNORMAL LOW (ref 12.0–15.0)
Hemoglobin: 9.4 g/dL — ABNORMAL LOW (ref 12.0–15.0)

## 2010-06-08 LAB — PROTIME-INR
INR: 1.33 (ref 0.00–1.49)
Prothrombin Time: 16.7 seconds — ABNORMAL HIGH (ref 11.6–15.2)

## 2010-06-09 LAB — BASIC METABOLIC PANEL
BUN: 8 mg/dL (ref 6–23)
CO2: 28 mEq/L (ref 19–32)
Calcium: 8.8 mg/dL (ref 8.4–10.5)
Chloride: 90 mEq/L — ABNORMAL LOW (ref 96–112)
Creatinine, Ser: 0.7 mg/dL (ref 0.4–1.2)
GFR calc Af Amer: >60 mL/min (ref >60)
GFR calc non Af Amer: >60 mL/min (ref >60)
Glucose, Bld: 103 mg/dL — ABNORMAL HIGH (ref 70–99)
Potassium: 3.2 mEq/L — ABNORMAL LOW (ref 3.5–5.1)
Sodium: 128 mEq/L — ABNORMAL LOW (ref 135–145)

## 2010-06-09 LAB — CBC
HCT: 29.2 % — ABNORMAL LOW (ref 36.0–46.0)
Hemoglobin: 9.7 g/dL — ABNORMAL LOW (ref 12.0–15.0)
MCH: 33.9 pg (ref 26.0–34.0)
MCHC: 33.2 g/dL (ref 30.0–36.0)
MCV: 102.1 fL — ABNORMAL HIGH (ref 78.0–100.0)
Platelets: 187 10*3/uL (ref 150–400)
RBC: 2.86 MIL/uL — ABNORMAL LOW (ref 3.87–5.11)
RDW: 12.9 % (ref 11.5–15.5)
WBC: 9.6 10*3/uL (ref 4.0–10.5)

## 2010-06-09 LAB — DIFFERENTIAL
Basophils Absolute: 0 10*3/uL (ref 0.0–0.1)
Basophils Relative: 0 % (ref 0–1)
Eosinophils Absolute: 0.1 10*3/uL (ref 0.0–0.7)
Eosinophils Relative: 1 % (ref 0–5)
Lymphocytes Relative: 7 % — ABNORMAL LOW (ref 12–46)
Lymphs Abs: 0.7 10*3/uL (ref 0.7–4.0)
Monocytes Absolute: 1.1 10*3/uL — ABNORMAL HIGH (ref 0.1–1.0)
Monocytes Relative: 11 % (ref 3–12)
Neutro Abs: 7.7 10*3/uL (ref 1.7–7.7)
Neutrophils Relative %: 81 % — ABNORMAL HIGH (ref 43–77)

## 2010-06-09 LAB — PROTIME-INR
INR: 1.64 — ABNORMAL HIGH (ref 0.00–1.49)
Prothrombin Time: 19.6 seconds — ABNORMAL HIGH (ref 11.6–15.2)

## 2010-06-10 LAB — URINE CULTURE
Colony Count: NO GROWTH
Culture  Setup Time: 201205200212
Culture: NO GROWTH
Special Requests: NEGATIVE

## 2010-06-10 LAB — PROTIME-INR
INR: 1.64 — ABNORMAL HIGH (ref 0.00–1.49)
Prothrombin Time: 19.6 seconds — ABNORMAL HIGH (ref 11.6–15.2)

## 2010-06-10 NOTE — Discharge Summary (Addendum)
  [name], [name]                 ACCOUNT NO.:  [id number]  MEDICAL RECORD NO.:  [id number]           PATIENT TYPE:  I  LOCATION:  1608                         FACILITY:  [facility]  PHYSICIAN:  Rozell Searing, [facility]    DATE OF BIRTH:  November 01, 1925  DATE OF ADMISSION:  06/07/2010 DATE OF DISCHARGE:                              DISCHARGE SUMMARY   ADDENDUM:  DISPOSITION:  We anticipate that she will go to skilled nursing facility on Wednesday, Jun 12, 2010.     Rozell Searing, [facility]     LD/MEDQ  D:  06/10/2010  T:  06/10/2010  Job:  [id number]  Electronically Signed by Rozell Searing  on 06/10/2010 03:37:27 PM Electronically Signed by Ranee Gosselin M.D. on 06/21/2010 07:31:24 AM

## 2010-06-10 NOTE — Discharge Summary (Addendum)
[name], [name]                 ACCOUNT NO.:  [id number]  MEDICAL RECORD NO.:  [id number]           PATIENT TYPE:  I  LOCATION:  1608                         FACILITY:  [facility]  PHYSICIAN:  Georges Lynch. Landi Biscardi, M.D.DATE OF BIRTH:  1925-11-13  DATE OF ADMISSION:  06/07/2010 DATE OF DISCHARGE:                              DISCHARGE SUMMARY   ADMITTING DIAGNOSES: 1. End-stage arthritis of the right hip. 2. Hypertension. 3. Arthritis.  DISCHARGE DIAGNOSES: 1. End-stage arthritis of the right hip status post right total hip     arthroplasty. 2. Hypertension. 3. Arthritis. 4. Hypokalemia. 5. Hyponatremia.  LABORATORY DATA:  Preoperative hemoglobin of 13.8, hematocrit 40.4. Preoperative INR 1.22.  Postoperative day #1, the patient's hemoglobin had dropped to 10.2.  She had been started on Coumadin and heparin per Pharmacy.  Her INR had reached 1.33 on postoperative day #2.  Hemoglobin had dropped to 9.4.  INR reached 1.64.  Postop day #3, INR remained subtherapeutic at 1.64.  On postoperative day #2, a chemistry panel was drawn.  She was found to be hypokalemic at 3.2 and hyponatremia at 128. Urine was also checked and was negative for infection.  PROCEDURE:  On Jun 07, 2010, [name] was  taken to the operating room by surgeon Dr. Ranee Gosselin, assistants Dr. Durene Romans and Rozell Searing PA-C.  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  The procedure was performed under general anesthesia.  Routine orthopedic prep and drape were carried out. She received 2 g of IV Ancef preoperatively.  There were no complications with the procedure.  She was returned to the recovery room in satisfactory condition.  Postoperative radiograph of the right hip revealed well seated components of the right total hip arthroplasty.  HOSPITAL COURSE:  The patient was admitted to [facility] on Jun 07, 2010.  She underwent the above-stated  procedure without complication.  After adequate time in the recovery room, she was transported to the orthopedic floor.  She was started on heparin and Coumadin per pharmacy.  She was also on reduced dose PCA Dilaudid and muscle relaxant for pain control.  Postoperative day #1, the patient seemed to be doing well.  Pain was well controlled.  Dressing was dry. Postoperative day #2, pain continued to be fairly well controlled, dressing changed.  No signs of infection.  She was visited by Physical Therapy on postoperative day #1.  Their notes seemed to out of order in the chart.  She was unable to pivot or transfer from a recliner to bed. She had increased pain with movement, postoperative day #2 physical therapy visit.  She was able to pivot using the rolling walker and able to work on her transfers in both the morning and afternoon sessions. Postoperative day #3, decision with Dr. Darrelyn Hillock and the patient's family is that it will be safer for her to go to skilled nursing and the Dilaudid was discontinued as there was concern that this is causing her to be a little loopy.  Decision was made then to give her extra strength Tylenol 2 tablets q.3-4  h for pain control.  DISPOSITION:  To skilled nursing facility on Jun 10, 2010.  DISCHARGE MEDICATIONS: 1. Extra Strength Tylenol. 2. Acetaminophen 500 mg 2 tablets p.o. q.4 h. p.r.n. pain. 3. Maalox suspension. 4. Coumadin 5 mg daily or to be dosed per skilled nursing facility.     The patient is on lifelong Coumadin.  INR needs to be kept between     2 and 3 to remain therapeutic.  Again, she is on lifelong Coumadin. 5. Ambien 5 mg 1 tablet p.o. q.h.s. p.r.n. insomnia. 6. Metoprolol succinate 50 mg XL tabs 1 tablet p.o. b.i.d. 7. Cardizem 240 mg 1 tablet p.o. in the morning. 8. Dulcolax 10 mg suppository once daily p.r.n. constipation. 9. Colace 100 mg 1 tablet p.o. b.i.d. p.r.n. constipation. 10.Robaxin 500 mg 1 tablet p.o. q.6 h. p.r.n.  muscle spasm. 11.Ferrous sulfate 325 mg 1 tablet p.o. t.i.d. 12.Protonix 40 mg 1 tablet p.o. q.a.m. 13.Synthroid 25 mcg 1 tablet p.o. q.a.m. 14.Ultram 50 mg 1 tablet p.o. q.8 h. p.r.n. pain. 15.Vitamin B complex with vitamin D 1 tablet p.o. daily. 16.Vitamin C 250 mg 1 tablet p.o. daily. 17.Vitamin D3 1000 units p.o. 1 tablet p.o. daily.  ACTIVITY:  The patient should increase her activity slowly.  She is partial weightbearing at 50%.  She needs to use a walker at all times with assistance.  Physical therapy should work with the patient on gait training and strengthening the muscles of the hip stabilizers.  She is able to shower.  DIET:  No restrictions.  WOUND CARE:  She will need daily dressing change.  FOLLOWUP:  She will follow up with Dr. Darrelyn Hillock in the office 2 weeks from the day of surgery which will be June 21, 2010.  This appointment should be made by the skilled nursing facility.  CONDITION ON DISCHARGE:  Slowly improving.     Rozell Searing, PAC   ______________________________ Georges Lynch Darrelyn Hillock, M.D.    LD/MEDQ  D:  06/10/2010  T:  06/10/2010  Job:  [id number]  Electronically Signed by Rozell Searing  on 06/10/2010 03:37:29 PM Electronically Signed by Ranee Gosselin M.D. on 06/21/2010 07:31:19 AM

## 2010-06-11 ENCOUNTER — Ambulatory Visit: Payer: Medicare Other | Admitting: Internal Medicine

## 2010-06-11 LAB — PROTIME-INR
INR: 1.93 — ABNORMAL HIGH (ref 0.00–1.49)
Prothrombin Time: 22.2 seconds — ABNORMAL HIGH (ref 11.6–15.2)

## 2010-06-12 LAB — PROTIME-INR
INR: 2.23 — ABNORMAL HIGH (ref 0.00–1.49)
Prothrombin Time: 24.8 seconds — ABNORMAL HIGH (ref 11.6–15.2)

## 2010-06-18 NOTE — H&P (Addendum)
[name], [name]                 ACCOUNT NO.:  [id number]  MEDICAL RECORD NO.:  [id number]          PATIENT TYPE:  INP  LOCATION:                                 FACILITY:  PHYSICIAN:  Georges Lynch. Ahrianna Siglin, M.D.DATE OF BIRTH:  1925/04/05  DATE OF ADMISSION:  06/07/2010 DATE OF DISCHARGE:                             HISTORY & PHYSICAL   CHIEF COMPLAINT:  Right hip pain.  BRIEF HISTORY:  The patient has been followed by Dr. Darrelyn Hillock for worsening pain in the left hip.  She is actually initially seen by Dr. Danielle Dess as her primary care felt that possibly she was having back pain. X-rays there revealed severely arthritic right hip and she was sent to Dr. Darrelyn Hillock for followup.  The patient has been cleared for surgery by her cardiologist, Dr. Clifton James.  The patient is on lifelong Coumadin. We will hold it for 5 days prior to surgery and resume postoperatively. Her primary care physician is Dr. Posey Rea.  ALLERGIES:  MACRODANTIN.  CURRENT MEDICATIONS: 1. Cardizem. 2. Metoprolol. 3. Pantoprazole. 4. Synthroid. 5. Vitamin B complex. 6. Vitamin D. 7. Ferrous sulfate. 8. Vitamin C. 9. Coumadin. 10.Temazepam.  PAST MEDICAL HISTORY: 1. End-stage arthritis of the right hip. 2. Hypertension. 3. Arthritis.  PAST SURGICAL HISTORY:  She has had rotator cuff repair and lumbar surgery.  FAMILY HISTORY:  Father passed at the age of 69, he had cancer.  Mother passed at the age of 34, she had a sinus infection.  SOCIAL HISTORY:  The patient is married.  She is retired.  She denies past or present use of tobacco products.  She drinks a glass of wine occasionally.  She has 2 children.  She does plan to go home following her hospital stay.  REVIEW OF SYSTEMS:  GENERAL:  Negative for fevers, chills or weight change.  HEENT:  NEURO:  Negative for headache or blurred vision. DERMATOLOGIC:  Negative for rash or lesion.  RESPIRATORY:  Negative for shortness of breath at rest or with  exertion.  CARDIOVASCULAR:  Negative for chest pain or palpitations.  GI: Negative for nausea, vomiting or diarrhea.  GU:  Negative for hematuria or dysuria.  MUSCULOSKELETAL: Positive for joint pain and back pain.  PHYSICAL EXAMINATION:  VITAL SIGNS:  Pulse 88, respirations 18, blood pressure 138/84 in the left arm. GENERAL:  [name] is alert and oriented x3.  She is well developed, well nourished, in no apparent distress.  She is a pleasant 77 year old female with stated height of 5 feet 3 inches and a stated weight of 170 pounds. HEENT:  Normocephalic, atraumatic.  Extraocular movements intact.  The patient wears reading glasses. NECK:  Supple.  Full range of motion without lymphadenopathy. CHEST:  Lungs are clear to auscultation bilaterally without wheezes, rhonchi or rales. HEART:  Regular rate and rhythm without murmur. ABDOMEN:  Bowel sounds present in all 4 quadrants.  Abdomen is soft, nontender to palpation.  EXTREMITIES:  Right hip, she has significantly decreased range of motion in all ranges and very painful with range of motion. SKIN:  Unremarkable. NEUROLOGIC:  Intact. PERIPHERAL VASCULAR:  Carotid pulses  2+ bilaterally without bruit.  RADIOGRAPHY:  AP and lateral views of the right hip reveal severe avascular necrosis.  IMPRESSION:  Severe end-stage arthritis with avascular necrosis of the right hip.  PLAN:  Right total hip arthroplasty to be performed by Dr. Darrelyn Hillock.     Rozell Searing, PAC   ______________________________ Georges Lynch Darrelyn Hillock, M.D.    LD/MEDQ  D:  06/07/2010  T:  06/07/2010  Job:  [id number]  Electronically Signed by Rozell Searing  on 06/18/2010 09:17:19 AM Electronically Signed by Ranee Gosselin M.D. on 06/21/2010 07:31:21 AM

## 2010-06-21 NOTE — Op Note (Signed)
[name], [name]                 ACCOUNT NO.:  [id number]  MEDICAL RECORD NO.:  [id number]           PATIENT TYPE:  I  LOCATION:  0001                         FACILITY:  [facility]  PHYSICIAN:  Georges Lynch. Prithvi Kooi, M.D.DATE OF BIRTH:  June 27, 1925  DATE OF PROCEDURE: DATE OF DISCHARGE:                              OPERATIVE REPORT   PREOPERATIVE DIAGNOSIS:  Severe avascular necrosis of the right hip.  POSTOPERATIVE DIAGNOSIS:  Severe avascular necrosis of the right hip.  OPERATION:  Right total-hip arthroplasty utilizing the DePuy system.  We utilized a pinnacle cup measuring 52-mm outside diameter with one cancellus screw measuring 25-mm in length.  We also utilized a hole eliminator.  We then used a 52-mm insert.  It was an Altrex insert.  It was a +4 neutral 36-mm inside diameter.  The stem was a Trilock stem size 6 standard.  The head was a +1.5, 36-mm ceramic head.  SURGEON:  Georges Lynch. Hartford Maulden, M.D.  ASSISTANTS: 1. Madlyn Frankel Charlann Boxer, M.D. 2. Rozell Searing, [facility]  PROCEDURE IN DETAIL:  Under general anesthesia, a routine orthopedic prepping and draping of the right hip was carried out.  She had 2 grams of IV Ancef preoperatively.  The appropriate time-out was carried out before any incisions were made.  Also at this time we marked the appropriate right leg in the holding area.  At this time, a posterolateral approach to the hip was carried out.  The retractors were inserted.  I then went down and opened the iliotibial band and dissected the gluteal muscle by blunt dissection with great care taken not to injure the underlying sciatic nerve.  At this time, I partially detached the external rotators.  I did a capsulectomy and then dislocated the femoral head and I amputated the head at the appropriate level.  I then utilized the box osteotome followed by the widening reamer for the proximal greater trochanteric region.  I then inserted the canal finder to make sure we were in the  canal.  I thoroughly irrigated out the canal.  Following that, we then rasped up to a size 6 Trilock stem. After that, we irrigated the canal out again and I packed the canal with the large sponge and then attention was directed to the acetabulum.  We completed the capsulectomy, reamed the acetabulum up to a size 51 for a 52-mm outside diameter cup.  After this was done, we then went ahead and inserted our permanent 52-mm pinnacle cup with one screw.  We did utilize the Charnley guide to make sure that we had good position.  At that time, we inserted a hole eliminator and then inserted our permanent polyethylene Altrex cup.  At that particular time, we went through trials and finally selected a +1.5 ceramic head and we utilized a standard offset Trilock stem.  We inserted in our permanent Trilock stem after we irrigated the canal again.  Prior to that we did utilize the calcar reamer to even out the femoral neck.  At that particular time, we then irrigated the area out after the Trilock stem was inserted.  As I mentioned, we  went through the trials again with a +5 and a + 1.5 head. We finally selected a +1.5 head and then inserted it as a permanent ceramic head, reduced the hip and then had excellent motion.  We thoroughly irrigated out the area and closed the wound in layers in the usual fashion after I injected 10 cc of FloSeal into the subacetabular space.  The main part of the wound, as I had mentioned, was closed in the usual fashion and the skin with metal staples.  A sterile Neosporin dressing was applied.  The patient left the operating room in satisfactory condition.  Estimated blood loss was 200 cc.          ______________________________ Georges Lynch. Darrelyn Hillock, M.D.     RAG/MEDQ  D:  06/07/2010  T:  06/07/2010  Job:  [id number]  cc:   Georgina Quint. [name], MD [address] [place] [address]  Verne Carrow, MD [address] [place]  [address]  Electronically Signed by Ranee Gosselin M.D. on 06/21/2010 07:31:17 AM

## 2010-06-25 ENCOUNTER — Ambulatory Visit (INDEPENDENT_AMBULATORY_CARE_PROVIDER_SITE_OTHER): Payer: Self-pay | Admitting: Internal Medicine

## 2010-06-25 DIAGNOSIS — R0989 Other specified symptoms and signs involving the circulatory and respiratory systems: Secondary | ICD-10-CM

## 2010-06-25 LAB — POCT INR: INR: 1.4

## 2010-07-02 ENCOUNTER — Ambulatory Visit (INDEPENDENT_AMBULATORY_CARE_PROVIDER_SITE_OTHER): Payer: Self-pay | Admitting: Cardiology

## 2010-07-02 DIAGNOSIS — R0989 Other specified symptoms and signs involving the circulatory and respiratory systems: Secondary | ICD-10-CM

## 2010-07-02 LAB — POCT INR: INR: 2.5

## 2010-07-05 ENCOUNTER — Other Ambulatory Visit: Payer: Self-pay | Admitting: Medical

## 2010-07-05 ENCOUNTER — Encounter (HOSPITAL_BASED_OUTPATIENT_CLINIC_OR_DEPARTMENT_OTHER): Payer: Medicare Other | Admitting: Oncology

## 2010-07-05 DIAGNOSIS — D509 Iron deficiency anemia, unspecified: Secondary | ICD-10-CM

## 2010-07-05 LAB — CBC WITH DIFFERENTIAL/PLATELET
BASO%: 0.6 % (ref 0.0–2.0)
Basophils Absolute: 0 10*3/uL (ref 0.0–0.1)
EOS%: 3.2 % (ref 0.0–7.0)
Eosinophils Absolute: 0.1 10*3/uL (ref 0.0–0.5)
HCT: 34.4 % — ABNORMAL LOW (ref 34.8–46.6)
HGB: 11.4 g/dL — ABNORMAL LOW (ref 11.6–15.9)
LYMPH%: 26.4 % (ref 14.0–49.7)
MCH: 35.4 pg — ABNORMAL HIGH (ref 25.1–34.0)
MCHC: 33.2 g/dL (ref 31.5–36.0)
MCV: 106.5 fL — ABNORMAL HIGH (ref 79.5–101.0)
MONO#: 0.5 10*3/uL (ref 0.1–0.9)
MONO%: 11.5 % (ref 0.0–14.0)
NEUT#: 2.7 10*3/uL (ref 1.5–6.5)
NEUT%: 58.3 % (ref 38.4–76.8)
Platelets: 277 10*3/uL (ref 145–400)
RBC: 3.23 10*6/uL — ABNORMAL LOW (ref 3.70–5.45)
RDW: 14.9 % — ABNORMAL HIGH (ref 11.2–14.5)
WBC: 4.7 10*3/uL (ref 3.9–10.3)
lymph#: 1.2 10*3/uL (ref 0.9–3.3)

## 2010-07-05 LAB — COMPREHENSIVE METABOLIC PANEL
ALT: 12 U/L (ref 0–35)
AST: 17 U/L (ref 0–37)
Albumin: 4.2 g/dL (ref 3.5–5.2)
Alkaline Phosphatase: 89 U/L (ref 39–117)
BUN: 10 mg/dL (ref 6–23)
CO2: 25 mEq/L (ref 19–32)
Calcium: 8.8 mg/dL (ref 8.4–10.5)
Chloride: 101 mEq/L (ref 96–112)
Creatinine, Ser: 0.89 mg/dL (ref 0.50–1.10)
Glucose, Bld: 134 mg/dL — ABNORMAL HIGH (ref 70–99)
Potassium: 3.7 mEq/L (ref 3.5–5.3)
Sodium: 138 mEq/L (ref 135–145)
Total Bilirubin: 0.7 mg/dL (ref 0.3–1.2)
Total Protein: 7 g/dL (ref 6.0–8.3)

## 2010-07-05 LAB — IRON AND TIBC
%SAT: 18 % — ABNORMAL LOW (ref 20–55)
Iron: 60 ug/dL (ref 42–145)
TIBC: 334 ug/dL (ref 250–470)
UIBC: 274 ug/dL

## 2010-07-05 LAB — FERRITIN: Ferritin: 365 ng/mL — ABNORMAL HIGH (ref 10–291)

## 2010-07-15 ENCOUNTER — Telehealth: Payer: Self-pay | Admitting: *Deleted

## 2010-07-15 MED ORDER — TEMAZEPAM 30 MG PO CAPS: 30.0000 mg | ORAL_CAPSULE | Freq: Every day | ORAL | Status: DC

## 2010-07-15 NOTE — Telephone Encounter (Signed)
OK to fill this prescription with additional refills x5 Thank you!  

## 2010-07-15 NOTE — Telephone Encounter (Signed)
rec rf req for Temazepam 30mg  1 qhs # 30. Last filled 06-01-10. Ok to Rf?

## 2010-07-16 ENCOUNTER — Ambulatory Visit (INDEPENDENT_AMBULATORY_CARE_PROVIDER_SITE_OTHER): Payer: Medicare Other | Admitting: *Deleted

## 2010-07-16 DIAGNOSIS — Z7901 Long term (current) use of anticoagulants: Secondary | ICD-10-CM

## 2010-07-16 DIAGNOSIS — I4891 Unspecified atrial fibrillation: Secondary | ICD-10-CM

## 2010-07-16 DIAGNOSIS — I4892 Unspecified atrial flutter: Secondary | ICD-10-CM

## 2010-07-16 LAB — POCT INR: INR: 2.5

## 2010-08-09 ENCOUNTER — Other Ambulatory Visit: Payer: Self-pay | Admitting: *Deleted

## 2010-08-09 MED ORDER — FUROSEMIDE 40 MG PO TABS: 40.0000 mg | ORAL_TABLET | ORAL | Status: DC | PRN

## 2010-08-13 ENCOUNTER — Ambulatory Visit (INDEPENDENT_AMBULATORY_CARE_PROVIDER_SITE_OTHER): Payer: Medicare Other | Admitting: *Deleted

## 2010-08-13 DIAGNOSIS — Z7901 Long term (current) use of anticoagulants: Secondary | ICD-10-CM

## 2010-08-13 DIAGNOSIS — I4892 Unspecified atrial flutter: Secondary | ICD-10-CM

## 2010-08-13 DIAGNOSIS — I4891 Unspecified atrial fibrillation: Secondary | ICD-10-CM

## 2010-08-13 LAB — POCT INR: INR: 2.8

## 2010-08-22 ENCOUNTER — Telehealth: Payer: Self-pay | Admitting: *Deleted

## 2010-08-22 ENCOUNTER — Other Ambulatory Visit: Payer: Self-pay | Admitting: Cardiovascular Disease

## 2010-08-22 NOTE — Telephone Encounter (Signed)
Spoke w/pt - she is c/o constant swelling & redness in her lower legs. Says Dr Posey Rea looked at her legs at last OV but now she also has itching & redness. OV scheduled for tomorrow.

## 2010-08-23 ENCOUNTER — Encounter: Payer: Self-pay | Admitting: Internal Medicine

## 2010-08-23 ENCOUNTER — Other Ambulatory Visit (INDEPENDENT_AMBULATORY_CARE_PROVIDER_SITE_OTHER): Payer: Medicare Other

## 2010-08-23 ENCOUNTER — Ambulatory Visit (INDEPENDENT_AMBULATORY_CARE_PROVIDER_SITE_OTHER): Payer: Medicare Other | Admitting: Internal Medicine

## 2010-08-23 DIAGNOSIS — E039 Hypothyroidism, unspecified: Secondary | ICD-10-CM

## 2010-08-23 DIAGNOSIS — I4891 Unspecified atrial fibrillation: Secondary | ICD-10-CM

## 2010-08-23 DIAGNOSIS — N259 Disorder resulting from impaired renal tubular function, unspecified: Secondary | ICD-10-CM

## 2010-08-23 DIAGNOSIS — R609 Edema, unspecified: Secondary | ICD-10-CM

## 2010-08-23 DIAGNOSIS — N390 Urinary tract infection, site not specified: Secondary | ICD-10-CM

## 2010-08-23 DIAGNOSIS — I10 Essential (primary) hypertension: Secondary | ICD-10-CM

## 2010-08-23 LAB — TSH: TSH: 0.69 u[IU]/mL (ref 0.35–5.50)

## 2010-08-23 LAB — COMPREHENSIVE METABOLIC PANEL
ALT: 16 U/L (ref 0–35)
AST: 21 U/L (ref 0–37)
Albumin: 4.2 g/dL (ref 3.5–5.2)
Alkaline Phosphatase: 81 U/L (ref 39–117)
BUN: 12 mg/dL (ref 6–23)
CO2: 29 mEq/L (ref 19–32)
Calcium: 9.1 mg/dL (ref 8.4–10.5)
Chloride: 101 mEq/L (ref 96–112)
Creatinine, Ser: 1 mg/dL (ref 0.4–1.2)
GFR: 59.74 mL/min — ABNORMAL LOW (ref >60.00)
Glucose, Bld: 124 mg/dL — ABNORMAL HIGH (ref 70–99)
Potassium: 4 mEq/L (ref 3.5–5.1)
Sodium: 137 mEq/L (ref 135–145)
Total Bilirubin: 1.4 mg/dL — ABNORMAL HIGH (ref 0.3–1.2)
Total Protein: 7.8 g/dL (ref 6.0–8.3)

## 2010-08-23 LAB — URINALYSIS, ROUTINE W REFLEX MICROSCOPIC
Bilirubin Urine: NEGATIVE
Ketones, ur: NEGATIVE
Nitrite: NEGATIVE
Specific Gravity, Urine: <=1.005 (ref 1.000–1.030)
Total Protein, Urine: NEGATIVE
Urine Glucose: NEGATIVE
Urobilinogen, UA: 0.2 (ref 0.0–1.0)
pH: 6 (ref 5.0–8.0)

## 2010-08-23 LAB — CBC WITH DIFFERENTIAL/PLATELET
Basophils Absolute: 0.1 10*3/uL (ref 0.0–0.1)
Basophils Relative: 1.1 % (ref 0.0–3.0)
Eosinophils Absolute: 0.1 10*3/uL (ref 0.0–0.7)
Eosinophils Relative: 1.7 % (ref 0.0–5.0)
HCT: 38.2 % (ref 36.0–46.0)
Hemoglobin: 12.6 g/dL (ref 12.0–15.0)
Lymphocytes Relative: 19.6 % (ref 12.0–46.0)
Lymphs Abs: 1.1 10*3/uL (ref 0.7–4.0)
MCHC: 33.1 g/dL (ref 30.0–36.0)
MCV: 104.2 fl — ABNORMAL HIGH (ref 78.0–100.0)
Monocytes Absolute: 0.5 10*3/uL (ref 0.1–1.0)
Monocytes Relative: 10 % (ref 3.0–12.0)
Neutro Abs: 3.7 10*3/uL (ref 1.4–7.7)
Neutrophils Relative %: 67.6 % (ref 43.0–77.0)
Platelets: 267 10*3/uL (ref 150.0–400.0)
RBC: 3.66 Mil/uL — ABNORMAL LOW (ref 3.87–5.11)
RDW: 13.9 % (ref 11.5–14.6)
WBC: 5.5 10*3/uL (ref 4.5–10.5)

## 2010-08-23 LAB — BRAIN NATRIURETIC PEPTIDE: Pro B Natriuretic peptide (BNP): 323 pg/mL — ABNORMAL HIGH (ref 0.0–100.0)

## 2010-08-23 MED ORDER — SULFAMETHOXAZOLE-TRIMETHOPRIM 800-160 MG PO TABS: 1.0000 | ORAL_TABLET | Freq: Two times a day (BID) | ORAL | Status: AC

## 2010-08-23 MED ORDER — FUROSEMIDE 40 MG PO TABS: 40.0000 mg | ORAL_TABLET | Freq: Two times a day (BID) | ORAL | Status: DC

## 2010-08-23 NOTE — Progress Notes (Signed)
Addended by: Etta Grandchild on: 08/23/2010 03:27 PM   Modules accepted: Orders

## 2010-08-23 NOTE — Assessment & Plan Note (Signed)
Her BP is well controlled 

## 2010-08-23 NOTE — Assessment & Plan Note (Addendum)
She has good rate and rhythm control. I will ask her to see her cardiologist soon. I will check her BNP today to see if she is in failure

## 2010-08-23 NOTE — Assessment & Plan Note (Signed)
Obviously will restart lasix at bid dose and today will check for CHF with a BNP and will check renal function and will check thyroid function

## 2010-08-23 NOTE — Patient Instructions (Signed)
Edema Edema is an abnormal build-up of fluids in tissues. Because this is partly dependent on gravity (water flows to the lowest place), it is more common in the lower extremities (legs and thighs). It is also common in the looser tissues, like around the eyes. Painless swelling of the feet and ankles is common and increases as a person ages. It may affect both legs and may include the calves or even thighs. When squeezed, the fluid may move out of the affected area and may leave a dent for a few moments. CAUSES  Prolonged standing or sitting in one place for extended periods of time. Movement helps pump tissue fluid into the veins, and absence of movement prevents this, resulting in edema.   Varicose veins. The valves in the veins do not work as well as they should. This causes fluid to leak into the tissues.   Fluid and salt overload.   Injury, burn, or surgery to the leg, ankle, or foot, may damage veins and allow fluid to leak out.   Sunburn damages vessels. Leaky vessels allow fluid to go out into the sunburned tissues.   Allergies (from insect bites or stings, medications or chemicals) cause swelling by allowing vessels to become leaky.   Protein in the blood helps keep fluid in your vessels. Low protein, as in malnutrition, allows fluid to leak out.   Hormonal changes, including pregnancy and menstruation, cause fluid retention. This fluid may leak out of vessels and cause edema.   Medications that cause fluid retention. Examples are sex hormones, blood pressure medications, steroid treatment, or anti-depressants.   Some illnesses cause edema, especially heart failure, kidney disease, or liver disease.   Surgery that cuts veins or lymph nodes, such as surgery done for the heart or for breast cancer, may result in edema.  DIAGNOSIS Your caregiver is usually easily able to determine what is causing your swelling (edema) by simply asking what is wrong (getting a history) and examining  you (doing a physical). Sometimes x-rays, EKG (electrocardiogram or heart tracing), and blood work may be done to evaluate for underlying medical illness. TREATMENT General treatment includes:  Leg elevation (or elevation of the affected body part).   Restriction of fluid intake.   Prevention of fluid overload.   Compression of the affected body part. Compression with elastic bandages or support stockings squeezes the tissues, preventing fluid from entering and forcing it back into the blood vessels.   Diuretics (also called water pills or fluid pills) pull fluid out of your body in the form of increased urination. These are effective in reducing the swelling, but can have side effects and must be used only under your caregiver's supervision. Diuretics are appropriate only for some types of edema.  The specific treatment can be directed at any underlying causes discovered. Heart, liver, or kidney disease should be treated appropriately. HOME CARE INSTRUCTIONS  Elevate the legs (or affected body part) above the level of the heart, while lying down.   Avoid sitting or standing still for prolonged periods of time.   Avoid putting anything directly under the knees when lying down, and do not wear constricting clothing or garters on the upper legs.   Exercising the legs causes the fluid to work back into the veins and lymphatic channels. This may help the swelling go down.   The pressure applied by elastic bandages or support stockings can help reduce ankle swelling.   A low-salt diet may help reduce fluid retention and decrease the   ankle swelling.   Take any medications exactly as prescribed.  SEEK MEDICAL CARE IF:  Your edema is not responding to recommended treatments.  SEEK IMMEDIATE MEDICAL CARE IF:  You develop shortness of breath or chest pain.   You cannot breathe when you lay down; or if, while lying down, you have to get up and go to the window to get your breath.   You are  having increasing swelling without relief from treatment.   You develop a fever over 100.5.   You develop pain or redness in the areas that are swollen.   Tell your caregiver right away if you have gained 2 lb in 1 day or 5 lb in a week.  MAKE SURE YOU:  Understand these instructions.   Will watch your condition.   Will get help right away if you are not doing well or get worse.  Document Released: 01/06/2005 Document Re-Released: 06/26/2009 [employer] Patient Information 2011 [employer]. 

## 2010-08-23 NOTE — Progress Notes (Signed)
Subjective:    Patient ID: [name], female    DOB: [birth year], 77 y.o.   MRN: [id number]  Thyroid Problem Presents for follow-up visit. Symptoms include leg swelling and weight gain. Patient reports no anxiety, cold intolerance, constipation, depressed mood, diaphoresis, diarrhea, dry skin, fatigue, hair loss, heat intolerance, hoarse voice, menstrual problem, nail problem, palpitations, tremors, visual change or weight loss. The symptoms have been worsening.  She has had worsening edema for about 3 months, she was taking lasix and was getting a good response until 10 days ago and for an unknown reason she stopped taking lasix and therefore the leg edema has returned.    Review of Systems  Constitutional: Positive for weight gain and unexpected weight change (weight gain). Negative for fever, chills, weight loss, diaphoresis, activity change, appetite change and fatigue.  HENT: Negative for sore throat, hoarse voice, facial swelling, trouble swallowing, neck pain, neck stiffness and voice change.   Eyes: Negative for photophobia, redness and visual disturbance.  Respiratory: Negative for apnea, cough, choking, chest tightness, shortness of breath, wheezing and stridor.   Cardiovascular: Positive for leg swelling. Negative for chest pain and palpitations.  Gastrointestinal: Negative for nausea, vomiting, abdominal pain, diarrhea and constipation.  Genitourinary: Negative for dysuria, urgency, frequency, decreased urine volume, enuresis, difficulty urinating, menstrual problem and dyspareunia.  Musculoskeletal: Negative for myalgias, back pain, joint swelling, arthralgias and gait problem.  Skin: Negative for color change, pallor, rash and wound.  Neurological: Negative for dizziness, tremors, seizures, syncope, facial asymmetry, speech difficulty, weakness, light-headedness, numbness and headaches.  Hematological: Negative for cold intolerance, heat intolerance and adenopathy. Does not  bruise/bleed easily.  Psychiatric/Behavioral: Negative.        Objective:   Physical Exam  Vitals reviewed. Constitutional: She is oriented to person, place, and time. She appears well-developed and well-nourished. No distress.  HENT:  Head: Normocephalic and atraumatic.  Right Ear: External ear normal.  Left Ear: External ear normal.  Nose: Nose normal.  Mouth/Throat: Oropharynx is clear and moist. No oropharyngeal exudate.  Eyes: Conjunctivae and EOM are normal. Pupils are equal, round, and reactive to light. Right eye exhibits no discharge. Left eye exhibits no discharge. No scleral icterus.  Neck: Normal range of motion. Neck supple. No JVD present. No tracheal deviation present. No thyromegaly present.  Cardiovascular: Normal rate, regular rhythm, S1 normal, S2 normal and normal heart sounds.  PMI is not displaced.  Exam reveals no gallop, no S3, no S4, no distant heart sounds, no friction rub and no decreased pulses.   No murmur heard. Pulses:      Carotid pulses are 1+ on the right side, and 1+ on the left side.      Radial pulses are 1+ on the right side, and 1+ on the left side.       Femoral pulses are 1+ on the right side, and 1+ on the left side.      Popliteal pulses are 1+ on the right side, and 1+ on the left side.       Dorsalis pedis pulses are 1+ on the right side, and 1+ on the left side.       Posterior tibial pulses are 1+ on the right side, and 1+ on the left side.  Pulmonary/Chest: Effort normal and breath sounds normal. No stridor. Not tachypneic. No respiratory distress. She has no decreased breath sounds. She has no wheezes. She has no rhonchi. She has no rales. She exhibits no tenderness.  Abdominal: Soft. Bowel  sounds are normal. She exhibits no distension and no mass. There is no tenderness. There is no rebound and no guarding.  Musculoskeletal: Normal range of motion. She exhibits edema (3+ edema in both legs). She exhibits no tenderness.  Lymphadenopathy:      She has no cervical adenopathy.  Neurological: She is alert and oriented to person, place, and time. She has normal reflexes. She displays normal reflexes. No cranial nerve deficit. She exhibits normal muscle tone.  Skin: Skin is warm and dry. No rash noted. She is not diaphoretic. No erythema. No pallor.  Psychiatric: She has a normal mood and affect. Her behavior is normal. Judgment and thought content normal.     Lab Results  Component Value Date   WBC 9.6 06/09/2010   HGB 11.4* 07/05/2010   HCT 34.4* 07/05/2010   PLT 277 07/05/2010   CHOL 153 02/06/2009   TRIG 60.0 02/06/2009   HDL 54.10 02/06/2009   LDLDIRECT 110.3 05/24/2008   ALT 12 07/05/2010   AST 17 07/05/2010   NA 138 07/05/2010   K 3.7 07/05/2010   CL 101 07/05/2010   CREATININE 0.89 07/05/2010   BUN 10 07/05/2010   CO2 25 07/05/2010   TSH 0.463 04/29/2010   INR 2.8 08/13/2010   HGBA1C 5.3 05/24/2008       Assessment & Plan:

## 2010-08-31 ENCOUNTER — Other Ambulatory Visit: Payer: Self-pay | Admitting: Cardiovascular Disease

## 2010-09-02 ENCOUNTER — Ambulatory Visit: Payer: Medicare Other | Admitting: Internal Medicine

## 2010-09-03 ENCOUNTER — Ambulatory Visit (INDEPENDENT_AMBULATORY_CARE_PROVIDER_SITE_OTHER): Payer: Medicare Other | Admitting: Cardiovascular Disease

## 2010-09-03 ENCOUNTER — Encounter: Payer: Self-pay | Admitting: Cardiovascular Disease

## 2010-09-03 VITALS — BP 118/76 | HR 94 | Ht 64.0 in | Wt 177.1 lb

## 2010-09-03 DIAGNOSIS — R600 Localized edema: Secondary | ICD-10-CM

## 2010-09-03 DIAGNOSIS — R609 Edema, unspecified: Secondary | ICD-10-CM

## 2010-09-03 DIAGNOSIS — I4891 Unspecified atrial fibrillation: Secondary | ICD-10-CM

## 2010-09-03 NOTE — Progress Notes (Signed)
History of Present Illness:77  yo WF with history of HTN and paroxysmal atrial fibrillation who is here today for follow up.  She has had chronic atrial flutter/fibrillation over the past 2 years and has been managed on coumadin and rate control agents. Earlier this year, she had dark stools with anemia. Coumadin was held and upper endoscopy showed several small clean based gastric ulcers. She was admitted October 2011 and found to be dehydrated with acute renal failure. She was hydrated and this resolved. Her hemoglobin remained stable. No further evidence of GI bleeding. She had several episodes of RVR with her atrial fib in the hospital.  Coumadin has been restarted without evidence of further GI bleeding. I last saw her in April. She had her right hip replaced per Dr. Maureen Chatters in May 2012. She has done well since then. Her back still bothers her every day since her back surgery last year. She has not been aware of any tachycardia or skipped beats. No chest pain or SOB.   She has had bilateral lower ext swelling since her hip replacement in May 2012. She was seen in primary care August 23, 2010. She was started on lasix 40 mg po  BID. Her swelling has gotten better. Her legs have been red. Venous dopplers in orthopedic surgery negative for DVT.   Past Medical History  Diagnosis Date  . Paroxysmal atrial fibrillation      on coumadin therapy  . Hypertension   . LBP (low back pain)     severe lumbar spondylosis s/p L3/L4,L4/L5 fusion 12/10  . OA (osteoarthritis)     Dr. Juliene Pina  . Osteopenia   . Elevated MCV   . Constipation     ischemic colitis with rectal bleeding   . B12 deficiency     boderline  . Renal insufficiency   . Hypothyroidism 2011  . NSAID-associated gastropathy 2011    with bleed  . Iron deficiency anemia 2011    elevated MCV  . Peptic ulcer disease     Past Surgical History  Procedure Date  . Tonsillectomy   . Rotator cuff repair 1997    Left  . Back surgery 12/2008     L3/L4/L fusion  . Spine surgery     Current Outpatient Prescriptions  Medication Sig Dispense Refill  . B Complex Vitamins (VITAMIN B COMPLEX) CAPS Take 1 capsule by mouth daily.       . Cholecalciferol (VITAMIN D3) 1000 UNITS tablet Take 1,000 Units by mouth daily.        [name] diltiazem (CARDIZEM) 120 MG tablet Take 120 mg by mouth. 1 tab daily       . furosemide (LASIX) 40 MG tablet Take 1 tablet (40 mg total) by mouth 2 (two) times daily. For swelling  60 tablet  11  . hydrOXYzine (ATARAX) 25 MG tablet Take 25-50 mg by mouth 2 (two) times daily as needed. For itching       . Iron Polysacch Cmplx-B12-FA (FERREX 150 FORTE) 150-0.025-1 MG CAPS Take 1 capsule by mouth 2 (two) times daily. Take with vit C 250 mg two times daily.      [name] levothyroxine (SYNTHROID, LEVOTHROID) 25 MCG tablet Take 25 mcg by mouth daily. For thyroid       . loratadine (CLARITIN) 10 MG tablet Take 10 mg by mouth daily as needed. For allergies       . metoprolol (TOPROL-XL) 50 MG 24 hr tablet TAKE 1 TABLET BY MOUTH 2 TIMES A DAY  60 tablet  6  . temazepam (RESTORIL) 30 MG capsule Take 1 capsule (30 mg total) by mouth daily.  30 capsule  5  . traMADol (ULTRAM) 50 MG tablet Take 50-100 mg by mouth 2 (two) times daily as needed. For pain       . warfarin (COUMADIN) 5 MG tablet USE AS DIRECTED BY ANTICOAGULATION CLINIC  30 tablet  2    Allergies  Allergen Reactions  . Atenolol     REACTION: sick  . Clarithromycin     REACTION: nausea  . Codeine Sulfate     REACTION: nausea  . Macrodantin   . Nitrofurantoin   . Percocet (Oxycodone-Acetaminophen)     REACTION: n/v    History   Social History  . Marital Status: Married    Spouse Name: N/A    Number of Children: 2  . Years of Education: N/A   Occupational History  . retired [employer]   Social History Main Topics  . Smoking status: Never Smoker   . Smokeless tobacco: Not on file  . Alcohol Use: 4.2 oz/week    7 Glasses of wine per week     1  glass daily  . Drug Use: No  . Sexually Active: Not Currently   Other Topics Concern  . Not on file   Social History Narrative  . No narrative on file    Family History  Problem Relation Age of Onset  . Cancer Father   . Hypertension Other     Review of Systems:  As stated in the HPI and otherwise negative.   BP 118/76  Pulse 94  Ht 5\' 4"  (1.626 m)  Wt 177 lb 1.9 oz (80.341 kg)  BMI 30.40 kg/m2  Physical Examination: General: Well developed, well nourished, NAD HEENT: OP clear, mucus membranes moist SKIN: warm, dry. No rashes. Neuro: No focal deficits Musculoskeletal: Muscle strength 5/5 all ext Psychiatric: Mood and affect normal Neck: No JVD, no carotid bruits, no thyromegaly, no lymphadenopathy. Lungs:Clear bilaterally, no wheezes, rhonci, crackles Cardiovascular: Regular rate and rhythm. No murmurs, gallops or rubs. Abdomen:Soft. Bowel sounds present. Non-tender.  Extremities: No lower extremity edema. Pulses are 2 + in the bilateral DP/PT.  BMW:UXLKGM fibrillation, rate 94 bpm. RBBB. T wave abnormality.

## 2010-09-03 NOTE — Assessment & Plan Note (Signed)
Rate controlled. No changes. Continue coumadin and rate control meds.

## 2010-09-03 NOTE — Patient Instructions (Signed)
Your physician recommends that you schedule a follow-up appointment in: 6 months  Your physician has requested that you have an echocardiogram. Echocardiography is a painless test that uses sound waves to create images of your heart. It provides your doctor with information about the size and shape of your heart and how well your heart's chambers and valves are working. This procedure takes approximately one hour. There are no restrictions for this procedure.    

## 2010-09-03 NOTE — Assessment & Plan Note (Addendum)
Elevated BNP last week although it was 320 which is non-specific.. Bilateral lower ext edema. Will get echo to re-evaluate LV function.

## 2010-09-10 ENCOUNTER — Encounter: Payer: Medicare Other | Admitting: *Deleted

## 2010-09-11 ENCOUNTER — Ambulatory Visit (HOSPITAL_COMMUNITY): Payer: Medicare Other | Attending: Cardiovascular Disease | Admitting: Radiology

## 2010-09-11 ENCOUNTER — Ambulatory Visit (INDEPENDENT_AMBULATORY_CARE_PROVIDER_SITE_OTHER): Payer: Medicare Other | Admitting: *Deleted

## 2010-09-11 DIAGNOSIS — E669 Obesity, unspecified: Secondary | ICD-10-CM | POA: Insufficient documentation

## 2010-09-11 DIAGNOSIS — I4892 Unspecified atrial flutter: Secondary | ICD-10-CM

## 2010-09-11 DIAGNOSIS — I4891 Unspecified atrial fibrillation: Secondary | ICD-10-CM | POA: Insufficient documentation

## 2010-09-11 DIAGNOSIS — I319 Disease of pericardium, unspecified: Secondary | ICD-10-CM | POA: Insufficient documentation

## 2010-09-11 DIAGNOSIS — I10 Essential (primary) hypertension: Secondary | ICD-10-CM | POA: Insufficient documentation

## 2010-09-11 DIAGNOSIS — R609 Edema, unspecified: Secondary | ICD-10-CM

## 2010-09-11 DIAGNOSIS — Z7901 Long term (current) use of anticoagulants: Secondary | ICD-10-CM

## 2010-09-11 DIAGNOSIS — I059 Rheumatic mitral valve disease, unspecified: Secondary | ICD-10-CM | POA: Insufficient documentation

## 2010-09-11 DIAGNOSIS — I079 Rheumatic tricuspid valve disease, unspecified: Secondary | ICD-10-CM | POA: Insufficient documentation

## 2010-09-11 DIAGNOSIS — R600 Localized edema: Secondary | ICD-10-CM

## 2010-09-11 LAB — POCT INR: INR: 3.2

## 2010-10-01 ENCOUNTER — Encounter (HOSPITAL_BASED_OUTPATIENT_CLINIC_OR_DEPARTMENT_OTHER): Payer: Medicare Other | Admitting: Oncology

## 2010-10-01 ENCOUNTER — Other Ambulatory Visit: Payer: Self-pay | Admitting: Oncology

## 2010-10-01 DIAGNOSIS — D509 Iron deficiency anemia, unspecified: Secondary | ICD-10-CM

## 2010-10-01 LAB — CBC WITH DIFFERENTIAL/PLATELET
BASO%: 1.3 % (ref 0.0–2.0)
Basophils Absolute: 0.1 10*3/uL (ref 0.0–0.1)
EOS%: 1.2 % (ref 0.0–7.0)
Eosinophils Absolute: 0.1 10*3/uL (ref 0.0–0.5)
HCT: 40.8 % (ref 34.8–46.6)
HGB: 13.9 g/dL (ref 11.6–15.9)
LYMPH%: 16.8 % (ref 14.0–49.7)
MCH: 34.7 pg — ABNORMAL HIGH (ref 25.1–34.0)
MCHC: 34 g/dL (ref 31.5–36.0)
MCV: 102.1 fL — ABNORMAL HIGH (ref 79.5–101.0)
MONO#: 0.7 10*3/uL (ref 0.1–0.9)
MONO%: 9.8 % (ref 0.0–14.0)
NEUT#: 5.4 10*3/uL (ref 1.5–6.5)
NEUT%: 70.9 % (ref 38.4–76.8)
Platelets: 275 10*3/uL (ref 145–400)
RBC: 4 10*6/uL (ref 3.70–5.45)
RDW: 13.7 % (ref 11.2–14.5)
WBC: 7.6 10*3/uL (ref 3.9–10.3)
lymph#: 1.3 10*3/uL (ref 0.9–3.3)

## 2010-10-01 LAB — IRON AND TIBC
%SAT: 34 % (ref 20–55)
Iron: 123 ug/dL (ref 42–145)
TIBC: 366 ug/dL (ref 250–470)
UIBC: 243 ug/dL (ref 125–400)

## 2010-10-01 LAB — FERRITIN: Ferritin: 148 ng/mL (ref 10–291)

## 2010-10-09 ENCOUNTER — Ambulatory Visit (INDEPENDENT_AMBULATORY_CARE_PROVIDER_SITE_OTHER): Payer: Medicare Other | Admitting: *Deleted

## 2010-10-09 DIAGNOSIS — I4891 Unspecified atrial fibrillation: Secondary | ICD-10-CM

## 2010-10-09 DIAGNOSIS — I4892 Unspecified atrial flutter: Secondary | ICD-10-CM

## 2010-10-09 DIAGNOSIS — Z7901 Long term (current) use of anticoagulants: Secondary | ICD-10-CM

## 2010-10-09 LAB — POCT INR: INR: 2.7

## 2010-10-22 ENCOUNTER — Ambulatory Visit: Payer: Medicare Other | Admitting: Internal Medicine

## 2010-11-06 ENCOUNTER — Ambulatory Visit (INDEPENDENT_AMBULATORY_CARE_PROVIDER_SITE_OTHER): Payer: Medicare Other | Admitting: *Deleted

## 2010-11-06 ENCOUNTER — Other Ambulatory Visit: Payer: Self-pay | Admitting: Internal Medicine

## 2010-11-06 DIAGNOSIS — I4891 Unspecified atrial fibrillation: Secondary | ICD-10-CM

## 2010-11-06 DIAGNOSIS — I4892 Unspecified atrial flutter: Secondary | ICD-10-CM

## 2010-11-06 DIAGNOSIS — Z7901 Long term (current) use of anticoagulants: Secondary | ICD-10-CM

## 2010-11-06 LAB — POCT INR: INR: 3.1

## 2010-11-11 ENCOUNTER — Encounter: Payer: Self-pay | Admitting: Internal Medicine

## 2010-11-11 ENCOUNTER — Ambulatory Visit (INDEPENDENT_AMBULATORY_CARE_PROVIDER_SITE_OTHER): Payer: Medicare Other | Admitting: Internal Medicine

## 2010-11-11 DIAGNOSIS — R609 Edema, unspecified: Secondary | ICD-10-CM

## 2010-11-11 DIAGNOSIS — I872 Venous insufficiency (chronic) (peripheral): Secondary | ICD-10-CM | POA: Insufficient documentation

## 2010-11-11 DIAGNOSIS — N259 Disorder resulting from impaired renal tubular function, unspecified: Secondary | ICD-10-CM

## 2010-11-11 DIAGNOSIS — R209 Unspecified disturbances of skin sensation: Secondary | ICD-10-CM

## 2010-11-11 DIAGNOSIS — I4891 Unspecified atrial fibrillation: Secondary | ICD-10-CM

## 2010-11-11 DIAGNOSIS — R202 Paresthesia of skin: Secondary | ICD-10-CM

## 2010-11-11 DIAGNOSIS — I10 Essential (primary) hypertension: Secondary | ICD-10-CM

## 2010-11-11 NOTE — Assessment & Plan Note (Signed)
Continue with current prescription therapy as reflected on the Med list.  

## 2010-11-11 NOTE — Assessment & Plan Note (Signed)
Monitoring

## 2010-11-11 NOTE — Progress Notes (Signed)
  Subjective:    Patient ID: [name], female    DOB: [birth year], 77 y.o.   MRN: [id number]  HPI  The patient presents for a follow-up of  chronic hypertension, chronic dyslipidemia, type 2 diabetes, LE edema controlled with medicines.  Wt Readings from Last 3 Encounters:  11/11/10 179 lb (81.194 kg)  09/03/10 177 lb 1.9 oz (80.341 kg)  08/23/10 187 lb (84.823 kg)     Review of Systems  Constitutional: Negative for chills, activity change, appetite change, fatigue and unexpected weight change.  HENT: Negative for congestion, mouth sores and sinus pressure.   Eyes: Negative for visual disturbance.  Respiratory: Negative for cough and chest tightness.   Gastrointestinal: Negative for nausea and abdominal pain.  Genitourinary: Negative for frequency, difficulty urinating and vaginal pain.  Musculoskeletal: Positive for back pain and gait problem.  Skin: Negative for pallor and rash.  Neurological: Negative for dizziness, tremors, weakness, numbness and headaches.  Psychiatric/Behavioral: Negative for confusion and sleep disturbance.       Objective:   Physical Exam  Constitutional: She appears well-developed. No distress.       Obese  HENT:  Head: Normocephalic.  Right Ear: External ear normal.  Left Ear: External ear normal.  Nose: Nose normal.  Mouth/Throat: Oropharynx is clear and moist.  Eyes: Conjunctivae are normal. Pupils are equal, round, and reactive to light. Right eye exhibits no discharge. Left eye exhibits no discharge.  Neck: Normal range of motion. Neck supple. No JVD present. No tracheal deviation present. No thyromegaly present.  Cardiovascular: Normal rate, regular rhythm and normal heart sounds.   Pulmonary/Chest: No stridor. No respiratory distress. She has no wheezes.  Abdominal: Soft. Bowel sounds are normal. She exhibits no distension and no mass. There is no tenderness. There is no rebound and no guarding.  Musculoskeletal: She exhibits edema  (trace B; indurated eryth skin) and tenderness (LS).       cane  Lymphadenopathy:    She has no cervical adenopathy.  Neurological: She displays normal reflexes. No cranial nerve deficit. She exhibits normal muscle tone. Coordination normal.  Skin: No rash noted. No erythema.  Psychiatric: She has a normal mood and affect. Her behavior is normal. Judgment and thought content normal.     Lab Results  Component Value Date   WBC 5.5 08/23/2010   HGB 13.9 10/01/2010   HCT 40.8 10/01/2010   PLT 275 10/01/2010   GLUCOSE 124* 08/23/2010   CHOL 153 02/06/2009   TRIG 60.0 02/06/2009   HDL 54.10 02/06/2009   LDLDIRECT 110.3 05/24/2008   LDLCALC 87 02/06/2009   ALT 16 08/23/2010   AST 21 08/23/2010   NA 137 08/23/2010   K 4.0 08/23/2010   CL 101 08/23/2010   CREATININE 1.0 08/23/2010   BUN 12 08/23/2010   CO2 29 08/23/2010   TSH 0.69 08/23/2010   INR 3.1 11/06/2010   HGBA1C 5.3 05/24/2008        Assessment & Plan:

## 2010-11-18 ENCOUNTER — Other Ambulatory Visit: Payer: Self-pay | Admitting: Internal Medicine

## 2010-12-02 ENCOUNTER — Other Ambulatory Visit: Payer: Self-pay | Admitting: Cardiovascular Disease

## 2010-12-04 ENCOUNTER — Ambulatory Visit (INDEPENDENT_AMBULATORY_CARE_PROVIDER_SITE_OTHER): Payer: Medicare Other | Admitting: *Deleted

## 2010-12-04 DIAGNOSIS — Z7901 Long term (current) use of anticoagulants: Secondary | ICD-10-CM

## 2010-12-04 DIAGNOSIS — I4891 Unspecified atrial fibrillation: Secondary | ICD-10-CM

## 2010-12-04 DIAGNOSIS — I4892 Unspecified atrial flutter: Secondary | ICD-10-CM

## 2010-12-04 LAB — POCT INR: INR: 2.6

## 2010-12-18 ENCOUNTER — Encounter: Payer: Self-pay | Admitting: *Deleted

## 2010-12-20 ENCOUNTER — Other Ambulatory Visit: Payer: Self-pay | Admitting: Gastroenterology

## 2010-12-31 ENCOUNTER — Ambulatory Visit (HOSPITAL_BASED_OUTPATIENT_CLINIC_OR_DEPARTMENT_OTHER): Payer: Medicare Other | Admitting: Oncology

## 2010-12-31 ENCOUNTER — Other Ambulatory Visit (HOSPITAL_BASED_OUTPATIENT_CLINIC_OR_DEPARTMENT_OTHER): Payer: Medicare Other | Admitting: Lab

## 2010-12-31 ENCOUNTER — Telehealth: Payer: Self-pay | Admitting: Oncology

## 2010-12-31 ENCOUNTER — Ambulatory Visit (INDEPENDENT_AMBULATORY_CARE_PROVIDER_SITE_OTHER): Payer: Medicare Other | Admitting: *Deleted

## 2010-12-31 ENCOUNTER — Other Ambulatory Visit: Payer: Self-pay | Admitting: Oncology

## 2010-12-31 VITALS — BP 140/75 | HR 93 | Temp 97.8°F | Ht 64.0 in | Wt 180.9 lb

## 2010-12-31 DIAGNOSIS — I4891 Unspecified atrial fibrillation: Secondary | ICD-10-CM

## 2010-12-31 DIAGNOSIS — D509 Iron deficiency anemia, unspecified: Secondary | ICD-10-CM

## 2010-12-31 DIAGNOSIS — Z7901 Long term (current) use of anticoagulants: Secondary | ICD-10-CM

## 2010-12-31 DIAGNOSIS — D649 Anemia, unspecified: Secondary | ICD-10-CM

## 2010-12-31 DIAGNOSIS — I4892 Unspecified atrial flutter: Secondary | ICD-10-CM

## 2010-12-31 LAB — CBC WITH DIFFERENTIAL/PLATELET
BASO%: 0.2 % (ref 0.0–2.0)
Basophils Absolute: 0 10*3/uL (ref 0.0–0.1)
EOS%: 0.8 % (ref 0.0–7.0)
Eosinophils Absolute: 0.1 10*3/uL (ref 0.0–0.5)
HCT: 40.8 % (ref 34.8–46.6)
HGB: 13.7 g/dL (ref 11.6–15.9)
LYMPH%: 13.4 % — ABNORMAL LOW (ref 14.0–49.7)
MCH: 35.6 pg — ABNORMAL HIGH (ref 25.1–34.0)
MCHC: 33.6 g/dL (ref 31.5–36.0)
MCV: 105.8 fL — ABNORMAL HIGH (ref 79.5–101.0)
MONO#: 0.9 10*3/uL (ref 0.1–0.9)
MONO%: 11.4 % (ref 0.0–14.0)
NEUT#: 5.7 10*3/uL (ref 1.5–6.5)
NEUT%: 74.2 % (ref 38.4–76.8)
Platelets: 253 10*3/uL (ref 145–400)
RBC: 3.86 10*6/uL (ref 3.70–5.45)
RDW: 13.7 % (ref 11.2–14.5)
WBC: 7.7 10*3/uL (ref 3.9–10.3)
lymph#: 1 10*3/uL (ref 0.9–3.3)

## 2010-12-31 LAB — IRON AND TIBC
%SAT: 14 % — ABNORMAL LOW (ref 20–55)
Iron: 49 ug/dL (ref 42–145)
TIBC: 353 ug/dL (ref 250–470)
UIBC: 304 ug/dL (ref 125–400)

## 2010-12-31 LAB — FERRITIN: Ferritin: 201 ng/mL (ref 10–291)

## 2010-12-31 LAB — POCT INR: INR: 3.4

## 2010-12-31 NOTE — Progress Notes (Signed)
Hematology and Oncology Follow Up Visit  ARENA LINDAHL [id number] 1925/09/04 77 y.o. 12/31/2010 1:40 PM  CC: Verne Carrow, MD  Rachael Fee, MD    Principle Diagnosis: This is an 77 year old female with iron deficiency anemia diagnosed November 2011 who presented with a hemoglobin of 8.7, saturation of 14%, iron level of 40.    Prior Therapy: Status post total iron replacement with Feraheme, a total of 1000 mg given November 2011.  Interim History: Mrs. Ringle presents today for an office followup visit.  Her husband accompanies her.  This is a pleasant 77 year old female who had an excellent response to [facility] back in November 2011.  She had 510 mg x2 doses a week apart.  At that time her hemoglobin was 8.7.  In December it came up to 11.9 and in March it was 14.0.  Ms. Gamel is currently on iron supplementation twice a day and is doing quite well. She did have a hip replacement back in May and is recovering quite nicely from that.  Overall her performance status and activity level are stable.  She does get around with a cane.  She denies any headaches, visual changes, odynophagia, dysphagia, any nausea, vomiting, diarrhea, constipation, chest pain, shortness of breath, productive cough, abdominal pain, abdominal swelling, lower extremity paresthesias, bowel or bladder incontinence, any fevers, chills or night sweats, any palpable lymph nodes, any obvious bleeding such as melena, hematochezia or hematuria.  Overall she reports she has a good appetite.  She drinks quite well.  She does not report any type of anorexia, unintentional weight loss or severe fatigue.     Medications: I have reviewed the patient's current medications. Current outpatient prescriptions:B Complex Vitamins (VITAMIN B COMPLEX) CAPS, Take 1 capsule by mouth daily. , Disp: , Rfl: ;  diltiazem (CARDIZEM CD) 120 MG 24 hr capsule, TAKE 1 CAPSULE EVERY DAY, Disp: 30 capsule, Rfl: 5;  FERREX 150 FORTE 150-25-1  MG-MCG-MG CAPS, TAKE 1 CAPSULE BY MOUTH 2 TIMES A DAY WITH VITAMIN C 250MG , Disp: 60 each, Rfl: 3 furosemide (LASIX) 40 MG tablet, Take 1 tablet (40 mg total) by mouth 2 (two) times daily. For swelling, Disp: 60 tablet, Rfl: 11;  hydrOXYzine (ATARAX) 25 MG tablet, Take 25-50 mg by mouth 2 (two) times daily as needed. For itching , Disp: , Rfl: ;  levothyroxine (SYNTHROID, LEVOTHROID) 25 MCG tablet, 1 BY MOUTH ONCE DAILY FOR THYROID, Disp: 90 tablet, Rfl: 3 metoprolol (TOPROL-XL) 50 MG 24 hr tablet, TAKE 1 TABLET BY MOUTH 2 TIMES A DAY, Disp: 60 tablet, Rfl: 6;  pantoprazole (PROTONIX) 40 MG tablet, TAKE 1 TABLET BY MOUTH EVERY DAY, Disp: 30 tablet, Rfl: 10;  temazepam (RESTORIL) 30 MG capsule, Take 1 capsule (30 mg total) by mouth daily., Disp: 30 capsule, Rfl: 5;  traMADol (ULTRAM) 50 MG tablet, Take 50-100 mg by mouth 2 (two) times daily as needed. For pain , Disp: , Rfl:  warfarin (COUMADIN) 5 MG tablet, USE AS DIRECTED BY ANTICOAGULATION CLINIC, Disp: 30 tablet, Rfl: 2;  Cholecalciferol (VITAMIN D3) 1000 UNITS tablet, Take 1,000 Units by mouth daily.  , Disp: , Rfl:   Allergies:  Allergies  Allergen Reactions  . Atenolol     REACTION: sick  . Clarithromycin     REACTION: nausea  . Codeine Sulfate     REACTION: nausea  . Macrodantin   . Nitrofurantoin   . Percocet (Oxycodone-Acetaminophen)     REACTION: n/v    Past Medical History, Surgical history, Social history,  and Family History were reviewed and updated.  Review of Systems: Constitutional:  Negative for fever, chills, night sweats, anorexia, weight loss, pain. Cardiovascular: no chest pain or dyspnea on exertion Respiratory: no cough, shortness of breath, or wheezing Neurological: no TIA or stroke symptoms Dermatological: negative ENT: negative Skin: Negative. Gastrointestinal: no abdominal pain, change in bowel habits, or black or bloody stools Genito-Urinary: no dysuria, trouble voiding, or hematuria Hematological and  Lymphatic: negative Breast: negative Musculoskeletal: negative Remaining ROS negative. Physical Exam: Blood pressure 140/75, pulse 93, temperature 97.8 F (36.6 C), temperature source Oral, height 5\' 4"  (1.626 m), weight 180 lb 14.4 oz (82.056 kg). ECOG: 2 General appearance: alert Head: Normocephalic, without obvious abnormality, atraumatic Neck: no adenopathy, no carotid bruit, no JVD, supple, symmetrical, trachea midline and thyroid not enlarged, symmetric, no tenderness/mass/nodules Lymph nodes: Cervical, supraclavicular, and axillary nodes normal. Heart:regular rate and rhythm, S1, S2 normal, no murmur, click, rub or gallop Lung:chest clear, no wheezing, rales, normal symmetric air entry Abdomin: soft, non-tender, without masses or organomegaly EXT:no erythema, induration, or nodules   Lab Results: Lab Results  Component Value Date   WBC 7.7 12/31/2010   HGB 13.7 12/31/2010   HCT 40.8 12/31/2010   MCV 105.8* 12/31/2010   PLT 253 12/31/2010     Chemistry      Component Value Date/Time   NA 137 08/23/2010 1405   K 4.0 08/23/2010 1405   CL 101 08/23/2010 1405   CO2 29 08/23/2010 1405   BUN 12 08/23/2010 1405   CREATININE 1.0 08/23/2010 1405      Component Value Date/Time   CALCIUM 9.1 08/23/2010 1405   ALKPHOS 81 08/23/2010 1405   AST 21 08/23/2010 1405   ALT 16 08/23/2010 1405   BILITOT 1.4* 08/23/2010 1405       Impression and Plan:   This is a pleasant 84-year female with the following issues:   1. Iron deficiency anemia.  She did have an excellent response to IV iron in the past.  She currently remains on iron supplementation twice a day.  Her hemoglobin is within normal limits at 13.7.  She will remain on her iron supplementation, and the plan is to follow back up with her in 6 months.  We are rechecking her iron studies today which I think will be within normal limits; however, in the future if her iron levels are low we can go ahead and provide Feraheme. 2. Follow up: 6  months.  Aly Hauser, MD 12/11/20121:40 PM

## 2010-12-31 NOTE — Telephone Encounter (Signed)
gve the pt her June 2013 appt calendar 

## 2011-01-01 ENCOUNTER — Other Ambulatory Visit: Payer: Self-pay | Admitting: Cardiovascular Disease

## 2011-01-16 ENCOUNTER — Other Ambulatory Visit: Payer: Self-pay | Admitting: *Deleted

## 2011-01-16 NOTE — Telephone Encounter (Signed)
R'cd fax from [facility] for refill of Temazepam 30mg -last written 07/15/2010 #30 with 5 refills-please advise.

## 2011-01-17 MED ORDER — TEMAZEPAM 30 MG PO CAPS: 30.0000 mg | ORAL_CAPSULE | Freq: Every day | ORAL | Status: DC

## 2011-01-17 NOTE — Telephone Encounter (Signed)
OK to fill this prescription with additional refills x5 Thank you!  

## 2011-01-17 NOTE — Telephone Encounter (Signed)
Rx called into [facility]

## 2011-01-28 ENCOUNTER — Ambulatory Visit (INDEPENDENT_AMBULATORY_CARE_PROVIDER_SITE_OTHER): Payer: Medicare Other | Admitting: *Deleted

## 2011-01-28 DIAGNOSIS — I4891 Unspecified atrial fibrillation: Secondary | ICD-10-CM | POA: Diagnosis not present

## 2011-01-28 DIAGNOSIS — I4892 Unspecified atrial flutter: Secondary | ICD-10-CM

## 2011-01-28 DIAGNOSIS — Z7901 Long term (current) use of anticoagulants: Secondary | ICD-10-CM

## 2011-01-28 LAB — POCT INR: INR: 3.5

## 2011-02-11 ENCOUNTER — Other Ambulatory Visit (INDEPENDENT_AMBULATORY_CARE_PROVIDER_SITE_OTHER): Payer: Medicare Other

## 2011-02-11 ENCOUNTER — Ambulatory Visit: Payer: Medicare Other | Admitting: Internal Medicine

## 2011-02-11 ENCOUNTER — Ambulatory Visit (INDEPENDENT_AMBULATORY_CARE_PROVIDER_SITE_OTHER): Payer: Medicare Other

## 2011-02-11 DIAGNOSIS — R202 Paresthesia of skin: Secondary | ICD-10-CM

## 2011-02-11 DIAGNOSIS — I10 Essential (primary) hypertension: Secondary | ICD-10-CM | POA: Diagnosis not present

## 2011-02-11 DIAGNOSIS — N259 Disorder resulting from impaired renal tubular function, unspecified: Secondary | ICD-10-CM | POA: Diagnosis not present

## 2011-02-11 DIAGNOSIS — I872 Venous insufficiency (chronic) (peripheral): Secondary | ICD-10-CM | POA: Diagnosis not present

## 2011-02-11 DIAGNOSIS — I4891 Unspecified atrial fibrillation: Secondary | ICD-10-CM | POA: Diagnosis not present

## 2011-02-11 DIAGNOSIS — I4892 Unspecified atrial flutter: Secondary | ICD-10-CM

## 2011-02-11 DIAGNOSIS — Z7901 Long term (current) use of anticoagulants: Secondary | ICD-10-CM | POA: Diagnosis not present

## 2011-02-11 DIAGNOSIS — R209 Unspecified disturbances of skin sensation: Secondary | ICD-10-CM

## 2011-02-11 LAB — BASIC METABOLIC PANEL
BUN: 17 mg/dL (ref 6–23)
CO2: 29 mEq/L (ref 19–32)
Calcium: 9 mg/dL (ref 8.4–10.5)
Chloride: 98 mEq/L (ref 96–112)
Creatinine, Ser: 1.1 mg/dL (ref 0.4–1.2)
GFR: 51.46 mL/min — ABNORMAL LOW (ref >60.00)
Glucose, Bld: 142 mg/dL — ABNORMAL HIGH (ref 70–99)
Potassium: 3.1 mEq/L — ABNORMAL LOW (ref 3.5–5.1)
Sodium: 135 mEq/L (ref 135–145)

## 2011-02-11 LAB — POCT INR: INR: 3

## 2011-02-11 LAB — HEPATIC FUNCTION PANEL
ALT: 18 U/L (ref 0–35)
AST: 21 U/L (ref 0–37)
Albumin: 3.5 g/dL (ref 3.5–5.2)
Alkaline Phosphatase: 75 U/L (ref 39–117)
Bilirubin, Direct: 0.1 mg/dL (ref 0.0–0.3)
Total Bilirubin: 0.6 mg/dL (ref 0.3–1.2)
Total Protein: 7.6 g/dL (ref 6.0–8.3)

## 2011-02-11 LAB — TSH: TSH: 0.64 u[IU]/mL (ref 0.35–5.50)

## 2011-02-11 LAB — VITAMIN B12: Vitamin B-12: 991 pg/mL — ABNORMAL HIGH (ref 211–911)

## 2011-02-18 ENCOUNTER — Ambulatory Visit (INDEPENDENT_AMBULATORY_CARE_PROVIDER_SITE_OTHER): Payer: Medicare Other | Admitting: Internal Medicine

## 2011-02-18 ENCOUNTER — Encounter: Payer: Self-pay | Admitting: Internal Medicine

## 2011-02-18 VITALS — BP 116/80 | HR 84 | Temp 97.0°F | Resp 16 | Wt 177.0 lb

## 2011-02-18 DIAGNOSIS — R609 Edema, unspecified: Secondary | ICD-10-CM

## 2011-02-18 DIAGNOSIS — M169 Osteoarthritis of hip, unspecified: Secondary | ICD-10-CM | POA: Diagnosis not present

## 2011-02-18 DIAGNOSIS — M545 Low back pain, unspecified: Secondary | ICD-10-CM | POA: Diagnosis not present

## 2011-02-18 DIAGNOSIS — I10 Essential (primary) hypertension: Secondary | ICD-10-CM

## 2011-02-18 DIAGNOSIS — M25559 Pain in unspecified hip: Secondary | ICD-10-CM | POA: Diagnosis not present

## 2011-02-18 DIAGNOSIS — R739 Hyperglycemia, unspecified: Secondary | ICD-10-CM

## 2011-02-18 DIAGNOSIS — E876 Hypokalemia: Secondary | ICD-10-CM | POA: Insufficient documentation

## 2011-02-18 DIAGNOSIS — R7309 Other abnormal glucose: Secondary | ICD-10-CM

## 2011-02-18 DIAGNOSIS — I4891 Unspecified atrial fibrillation: Secondary | ICD-10-CM

## 2011-02-18 MED ORDER — POTASSIUM CHLORIDE CRYS ER 20 MEQ PO TBCR: 20.0000 meq | EXTENDED_RELEASE_TABLET | Freq: Every day | ORAL | Status: DC

## 2011-02-18 NOTE — Assessment & Plan Note (Signed)
R THR 5/13 Better

## 2011-02-18 NOTE — Assessment & Plan Note (Signed)
Start KCl 

## 2011-02-18 NOTE — Assessment & Plan Note (Signed)
Better  

## 2011-02-18 NOTE — Assessment & Plan Note (Signed)
Continue with current prescription therapy as reflected on the Med list.  

## 2011-02-18 NOTE — Progress Notes (Signed)
  Subjective:    Patient ID: [name], female    DOB: 07/29/1925, 78 y.o.   MRN: [id number]  HPI   The patient is here to follow up on chronic LBP, situational depression due to LBP, anxiety, HTN controlled with medicines. LBP is bad w/activity   Review of Systems  Constitutional: Positive for fatigue. Negative for chills, activity change, appetite change and unexpected weight change.  HENT: Negative for congestion, mouth sores and sinus pressure.   Eyes: Negative for visual disturbance.  Respiratory: Negative for cough and chest tightness.   Gastrointestinal: Negative for nausea and abdominal pain.  Genitourinary: Negative for frequency, difficulty urinating and vaginal pain.  Musculoskeletal: Positive for back pain. Negative for gait problem.  Skin: Negative for pallor and rash.  Neurological: Negative for dizziness, tremors, weakness, numbness and headaches.  Psychiatric/Behavioral: Negative for suicidal ideas, confusion and sleep disturbance. The patient is not nervous/anxious.        Objective:   Physical Exam  Constitutional: She appears well-developed and well-nourished. No distress.  HENT:  Head: Normocephalic.  Right Ear: External ear normal.  Left Ear: External ear normal.  Nose: Nose normal.  Mouth/Throat: Oropharynx is clear and moist.  Eyes: Conjunctivae are normal. Pupils are equal, round, and reactive to light. Right eye exhibits no discharge. Left eye exhibits no discharge.  Neck: Normal range of motion. Neck supple. No JVD present. No tracheal deviation present. No thyromegaly present.  Cardiovascular: Normal rate, regular rhythm and normal heart sounds.   Pulmonary/Chest: No stridor. No respiratory distress. She has no wheezes.  Abdominal: Soft. Bowel sounds are normal. She exhibits no distension and no mass. There is no tenderness. There is no rebound and no guarding.  Musculoskeletal: She exhibits no edema and no tenderness.  Lymphadenopathy:    She has  no cervical adenopathy.  Neurological: She displays normal reflexes. No cranial nerve deficit. She exhibits normal muscle tone. Coordination normal.  Skin: No rash noted. No erythema.  Psychiatric: She has a normal mood and affect. Her behavior is normal. Judgment and thought content normal.      Lab Results  Component Value Date   WBC 7.7 12/31/2010   HGB 13.7 12/31/2010   HCT 40.8 12/31/2010   PLT 253 12/31/2010   GLUCOSE 142* 02/11/2011   CHOL 153 02/06/2009   TRIG 60.0 02/06/2009   HDL 54.10 02/06/2009   LDLDIRECT 110.3 05/24/2008   LDLCALC 87 02/06/2009   ALT 18 02/11/2011   AST 21 02/11/2011   NA 135 02/11/2011   K 3.1* 02/11/2011   CL 98 02/11/2011   CREATININE 1.1 02/11/2011   BUN 17 02/11/2011   CO2 29 02/11/2011   TSH 0.64 02/11/2011   INR 3.0 02/11/2011   HGBA1C 5.3 05/24/2008       Assessment & Plan:

## 2011-03-11 ENCOUNTER — Ambulatory Visit (INDEPENDENT_AMBULATORY_CARE_PROVIDER_SITE_OTHER): Payer: Medicare Other | Admitting: Pharmacist

## 2011-03-11 ENCOUNTER — Other Ambulatory Visit: Payer: Self-pay | Admitting: *Deleted

## 2011-03-11 ENCOUNTER — Telehealth: Payer: Self-pay | Admitting: *Deleted

## 2011-03-11 DIAGNOSIS — Z7901 Long term (current) use of anticoagulants: Secondary | ICD-10-CM | POA: Diagnosis not present

## 2011-03-11 DIAGNOSIS — Z961 Presence of intraocular lens: Secondary | ICD-10-CM | POA: Diagnosis not present

## 2011-03-11 DIAGNOSIS — I4892 Unspecified atrial flutter: Secondary | ICD-10-CM

## 2011-03-11 DIAGNOSIS — I4891 Unspecified atrial fibrillation: Secondary | ICD-10-CM

## 2011-03-11 LAB — POCT INR: INR: 3.8

## 2011-03-11 MED ORDER — IRON POLYSACCH CMPLX-B12-FA 150-0.025-1 MG PO CAPS: 1.0000 | ORAL_CAPSULE | Freq: Two times a day (BID) | ORAL | Status: DC

## 2011-03-11 NOTE — Telephone Encounter (Signed)
Non formulary exception is approved X 1 year eff 03-07-11. Pt was informed by [facility].

## 2011-03-21 DIAGNOSIS — M169 Osteoarthritis of hip, unspecified: Secondary | ICD-10-CM | POA: Diagnosis not present

## 2011-03-30 ENCOUNTER — Other Ambulatory Visit: Payer: Self-pay | Admitting: Cardiovascular Disease

## 2011-03-31 DIAGNOSIS — L570 Actinic keratosis: Secondary | ICD-10-CM | POA: Diagnosis not present

## 2011-03-31 DIAGNOSIS — I872 Venous insufficiency (chronic) (peripheral): Secondary | ICD-10-CM | POA: Diagnosis not present

## 2011-03-31 NOTE — Telephone Encounter (Signed)
...   Requested Prescriptions   Pending Prescriptions Disp Refills  . metoprolol succinate (TOPROL-XL) 50 MG 24 hr tablet [Pharmacy Med Name: METOPROLOL SUCC ER 50 MG TAB] 60 tablet 0    Sig: TAKE 1 TABLET BY MOUTH 2 TIMES A DAY  patient must keep appointment on 04/08/11 to get more refills

## 2011-04-01 ENCOUNTER — Ambulatory Visit (INDEPENDENT_AMBULATORY_CARE_PROVIDER_SITE_OTHER): Payer: Medicare Other

## 2011-04-01 DIAGNOSIS — Z1231 Encounter for screening mammogram for malignant neoplasm of breast: Secondary | ICD-10-CM | POA: Diagnosis not present

## 2011-04-01 DIAGNOSIS — Z7901 Long term (current) use of anticoagulants: Secondary | ICD-10-CM

## 2011-04-01 DIAGNOSIS — I4891 Unspecified atrial fibrillation: Secondary | ICD-10-CM | POA: Diagnosis not present

## 2011-04-01 DIAGNOSIS — I4892 Unspecified atrial flutter: Secondary | ICD-10-CM | POA: Diagnosis not present

## 2011-04-01 LAB — POCT INR: INR: 2.1

## 2011-04-08 ENCOUNTER — Encounter: Payer: Self-pay | Admitting: Cardiovascular Disease

## 2011-04-08 ENCOUNTER — Ambulatory Visit (INDEPENDENT_AMBULATORY_CARE_PROVIDER_SITE_OTHER): Payer: Medicare Other | Admitting: Cardiovascular Disease

## 2011-04-08 VITALS — BP 140/84 | HR 88 | Ht 63.0 in | Wt 179.0 lb

## 2011-04-08 DIAGNOSIS — I10 Essential (primary) hypertension: Secondary | ICD-10-CM

## 2011-04-08 DIAGNOSIS — I4891 Unspecified atrial fibrillation: Secondary | ICD-10-CM | POA: Diagnosis not present

## 2011-04-08 MED ORDER — METOPROLOL SUCCINATE ER 50 MG PO TB24: ORAL_TABLET | ORAL | Status: DC

## 2011-04-08 NOTE — Assessment & Plan Note (Signed)
Chronic persistent, rate controlled. She is anti-coagulated with coumadin with no bleeding issues. No changes.

## 2011-04-08 NOTE — Progress Notes (Signed)
History of Present Illness: 78 yo WF with history of HTN and paroxysmal atrial fibrillation who is here today for follow up. She has had chronic atrial flutter/fibrillation over the past 2 years and has been managed on coumadin and rate control agents. In 2011 she had dark stools with anemia. Coumadin was held and upper endoscopy showed several small clean based gastric ulcers. She was admitted October 2011 and found to be dehydrated with acute renal failure. She was hydrated and this resolved. Her hemoglobin remained stable. No further evidence of GI bleeding. She had several episodes of RVR with her atrial fib in the hospital. Coumadin has been restarted without evidence of further GI bleeding. I last saw her in August 2012. She had her right hip replaced per Dr. Maureen Chatters in May 2012. She has had bilateral lower ext swelling since her hip replacement in May 2012. She was seen in primary care August 23, 2010. She was started on lasix 40 mg po BID. This seemed to help the swelling. Venous dopplers negative for DVT.   She is here today for follow up.  She has not been aware of any tachycardia or skipped beats. No chest pain or SOB. Her back is feeling better. No bleeding issues.     Primary Care Physician:  [name]    Past Medical History  Diagnosis Date  . Paroxysmal atrial fibrillation      on coumadin therapy  . Hypertension   . LBP (low back pain)     severe lumbar spondylosis s/p L3/L4,L4/L5 fusion 12/10  . OA (osteoarthritis)     Dr. Juliene Pina  . Osteopenia   . Elevated MCV   . Constipation     ischemic colitis with rectal bleeding   . B12 deficiency     boderline  . Renal insufficiency   . Hypothyroidism 2011  . NSAID-associated gastropathy 2011    with bleed  . Iron deficiency anemia 2011    elevated MCV  . Peptic ulcer disease     Past Surgical History  Procedure Date  . Tonsillectomy   . Rotator cuff repair 1997    Left  . Back surgery 12/2008    L3/L4/L fusion  .  Spine surgery     Current Outpatient Prescriptions  Medication Sig Dispense Refill  . B Complex Vitamins (VITAMIN B COMPLEX) CAPS Take 1 capsule by mouth daily.       . Cholecalciferol (VITAMIN D3) 1000 UNITS tablet Take 1,000 Units by mouth daily.        . clobetasol cream (TEMOVATE) 0.05 % As directed      . diltiazem (CARDIZEM CD) 120 MG 24 hr capsule TAKE 1 CAPSULE EVERY DAY  30 capsule  5  . furosemide (LASIX) 40 MG tablet Take 1 tablet (40 mg total) by mouth 2 (two) times daily. For swelling  60 tablet  11  . hydrOXYzine (ATARAX) 25 MG tablet Take 25-50 mg by mouth 2 (two) times daily as needed. For itching       . Iron Polysacch Cmplx-B12-FA (FERREX 150 FORTE) 150-0.025-1 MG CAPS Take 1 capsule by mouth 2 (two) times daily.  60 each  3  . levothyroxine (SYNTHROID, LEVOTHROID) 25 MCG tablet 1 BY MOUTH ONCE DAILY FOR THYROID  90 tablet  3  . metoprolol succinate (TOPROL-XL) 50 MG 24 hr tablet TAKE 1 TABLET BY MOUTH 2 TIMES A DAY  60 tablet  0  . pantoprazole (PROTONIX) 40 MG tablet TAKE 1 TABLET BY MOUTH EVERY DAY  30 tablet  10  . potassium chloride SA (K-DUR,KLOR-CON) 20 MEQ tablet Take 1 tablet (20 mEq total) by mouth daily.  30 tablet  11  . temazepam (RESTORIL) 30 MG capsule Take 1 capsule (30 mg total) by mouth daily.  30 capsule  5  . traMADol (ULTRAM) 50 MG tablet Take 50-100 mg by mouth 2 (two) times daily as needed. For pain       . vitamin C (ASCORBIC ACID) 500 MG tablet Take 500 mg by mouth daily. ( TAKE WITH IRON)      . warfarin (COUMADIN) 5 MG tablet USE AS DIRECTED BY ANTICOAGULATION CLINIC  30 tablet  2    Allergies  Allergen Reactions  . Atenolol     REACTION: sick  . Clarithromycin     REACTION: nausea  . Codeine Sulfate     REACTION: nausea  . Macrodantin   . Nitrofurantoin   . Percocet (Oxycodone-Acetaminophen)     REACTION: n/v    History   Social History  . Marital Status: Married    Spouse Name: N/A    Number of Children: 2  . Years of Education:  N/A   Occupational History  . retired [employer]   Social History Main Topics  . Smoking status: Never Smoker   . Smokeless tobacco: Not on file  . Alcohol Use: 4.2 oz/week    7 Glasses of wine per week     1 glass daily  . Drug Use: No  . Sexually Active: Not Currently   Other Topics Concern  . Not on file   Social History Narrative  . No narrative on file    Family History  Problem Relation Age of Onset  . Cancer Father   . Hypertension Other     Review of Systems:  As stated in the HPI and otherwise negative.   BP 140/84  Pulse 88  Ht 5\' 3"  (1.6 m)  Wt 179 lb (81.194 kg)  BMI 31.71 kg/m2  Physical Examination: General: Well developed, well nourished, NAD HEENT: OP clear, mucus membranes moist SKIN: warm, dry. No rashes. Neuro: No focal deficits Musculoskeletal: Muscle strength 5/5 all ext Psychiatric: Mood and affect normal Neck: No JVD, no carotid bruits, no thyromegaly, no lymphadenopathy. Lungs:Clear bilaterally, no wheezes, rhonci, crackles Cardiovascular: Irregular. No murmurs, gallops or rubs. Abdomen:Soft. Bowel sounds present. Non-tender.  Extremities: No lower extremity edema. Pulses are 2 + in the bilateral DP/PT.

## 2011-04-08 NOTE — Patient Instructions (Signed)
Your physician wants you to follow-up in: 6 months.  You will receive a reminder letter in the mail two months in advance. If you don't receive a letter, please call our office to schedule the follow-up appointment.  Your physician recommends that you have fasting lab work done on day of labs at Dr. Loren Racer office

## 2011-04-27 ENCOUNTER — Other Ambulatory Visit: Payer: Self-pay | Admitting: Cardiovascular Disease

## 2011-04-29 ENCOUNTER — Ambulatory Visit (INDEPENDENT_AMBULATORY_CARE_PROVIDER_SITE_OTHER): Payer: Medicare Other | Admitting: Pharmacist

## 2011-04-29 DIAGNOSIS — Z7901 Long term (current) use of anticoagulants: Secondary | ICD-10-CM

## 2011-04-29 DIAGNOSIS — I4892 Unspecified atrial flutter: Secondary | ICD-10-CM

## 2011-04-29 DIAGNOSIS — I4891 Unspecified atrial fibrillation: Secondary | ICD-10-CM | POA: Diagnosis not present

## 2011-04-29 LAB — POCT INR: INR: 1.8

## 2011-05-13 ENCOUNTER — Other Ambulatory Visit: Payer: Medicare Other

## 2011-05-13 ENCOUNTER — Other Ambulatory Visit (INDEPENDENT_AMBULATORY_CARE_PROVIDER_SITE_OTHER): Payer: Medicare Other

## 2011-05-13 DIAGNOSIS — R609 Edema, unspecified: Secondary | ICD-10-CM | POA: Diagnosis not present

## 2011-05-13 DIAGNOSIS — R7309 Other abnormal glucose: Secondary | ICD-10-CM

## 2011-05-13 DIAGNOSIS — I10 Essential (primary) hypertension: Secondary | ICD-10-CM | POA: Diagnosis not present

## 2011-05-13 DIAGNOSIS — M25559 Pain in unspecified hip: Secondary | ICD-10-CM | POA: Diagnosis not present

## 2011-05-13 DIAGNOSIS — I4891 Unspecified atrial fibrillation: Secondary | ICD-10-CM

## 2011-05-13 DIAGNOSIS — E876 Hypokalemia: Secondary | ICD-10-CM

## 2011-05-13 DIAGNOSIS — R739 Hyperglycemia, unspecified: Secondary | ICD-10-CM

## 2011-05-13 LAB — BASIC METABOLIC PANEL
BUN: 15 mg/dL (ref 6–23)
CO2: 29 mEq/L (ref 19–32)
Calcium: 9.5 mg/dL (ref 8.4–10.5)
Chloride: 97 mEq/L (ref 96–112)
Creatinine, Ser: 1 mg/dL (ref 0.4–1.2)
GFR: 54.94 mL/min — ABNORMAL LOW (ref >60.00)
Glucose, Bld: 104 mg/dL — ABNORMAL HIGH (ref 70–99)
Potassium: 4.4 mEq/L (ref 3.5–5.1)
Sodium: 138 mEq/L (ref 135–145)

## 2011-05-13 LAB — HEMOGLOBIN A1C: Hgb A1c MFr Bld: 5.7 % (ref 4.6–6.5)

## 2011-05-19 ENCOUNTER — Other Ambulatory Visit: Payer: Medicare Other

## 2011-05-20 ENCOUNTER — Ambulatory Visit (INDEPENDENT_AMBULATORY_CARE_PROVIDER_SITE_OTHER): Payer: Medicare Other | Admitting: Internal Medicine

## 2011-05-20 ENCOUNTER — Encounter: Payer: Self-pay | Admitting: Internal Medicine

## 2011-05-20 VITALS — BP 130/80 | HR 84 | Temp 97.7°F | Resp 16 | Wt 178.0 lb

## 2011-05-20 DIAGNOSIS — E039 Hypothyroidism, unspecified: Secondary | ICD-10-CM

## 2011-05-20 DIAGNOSIS — M1990 Unspecified osteoarthritis, unspecified site: Secondary | ICD-10-CM

## 2011-05-20 DIAGNOSIS — M4800 Spinal stenosis, site unspecified: Secondary | ICD-10-CM

## 2011-05-20 DIAGNOSIS — I4891 Unspecified atrial fibrillation: Secondary | ICD-10-CM | POA: Diagnosis not present

## 2011-05-20 DIAGNOSIS — D649 Anemia, unspecified: Secondary | ICD-10-CM

## 2011-05-20 DIAGNOSIS — I10 Essential (primary) hypertension: Secondary | ICD-10-CM | POA: Diagnosis not present

## 2011-05-20 DIAGNOSIS — N259 Disorder resulting from impaired renal tubular function, unspecified: Secondary | ICD-10-CM

## 2011-05-20 NOTE — Assessment & Plan Note (Signed)
S/p surgery 

## 2011-05-20 NOTE — Assessment & Plan Note (Signed)
Continue with current prescription therapy as reflected on the Med list.  

## 2011-05-20 NOTE — Progress Notes (Signed)
Patient ID: ALEXSANDRIA Bell, female   DOB: 18-Oct-1925, 78 y.o.   MRN: [id number]  Subjective:    Patient ID: [name], female    DOB: 1925/09/19, 79 y.o.   MRN: [id number]  HPI   The patient is here to follow up on chronic LBP, situational depression due to LBP, anxiety, HTN controlled with medicines. LBP is bad w/activity  BP Readings from Last 3 Encounters:  05/20/11 130/80  04/08/11 140/84  02/18/11 116/80   Wt Readings from Last 3 Encounters:  05/20/11 178 lb (80.74 kg)  04/08/11 179 lb (81.194 kg)  02/18/11 177 lb (80.287 kg)      Review of Systems  Constitutional: Positive for fatigue. Negative for chills, activity change, appetite change and unexpected weight change.  HENT: Negative for congestion, mouth sores and sinus pressure.   Eyes: Negative for visual disturbance.  Respiratory: Negative for cough and chest tightness.   Gastrointestinal: Negative for nausea and abdominal pain.  Genitourinary: Negative for frequency, difficulty urinating and vaginal pain.  Musculoskeletal: Positive for back pain. Negative for gait problem.  Skin: Negative for pallor and rash.  Neurological: Negative for dizziness, tremors, weakness, numbness and headaches.  Psychiatric/Behavioral: Negative for suicidal ideas, confusion and sleep disturbance. The patient is not nervous/anxious.        Objective:   Physical Exam  Constitutional: She appears well-developed and well-nourished. No distress.  HENT:  Head: Normocephalic.  Right Ear: External ear normal.  Left Ear: External ear normal.  Nose: Nose normal.  Mouth/Throat: Oropharynx is clear and moist.  Eyes: Conjunctivae are normal. Pupils are equal, round, and reactive to light. Right eye exhibits no discharge. Left eye exhibits no discharge.  Neck: Normal range of motion. Neck supple. No JVD present. No tracheal deviation present. No thyromegaly present.  Cardiovascular: Normal rate, regular rhythm and normal heart sounds.     Pulmonary/Chest: No stridor. No respiratory distress. She has no wheezes.  Abdominal: Soft. Bowel sounds are normal. She exhibits no distension and no mass. There is no tenderness. There is no rebound and no guarding.  Musculoskeletal: She exhibits no edema and no tenderness.       cane  Lymphadenopathy:    She has no cervical adenopathy.  Neurological: She displays normal reflexes. No cranial nerve deficit. She exhibits normal muscle tone. Coordination normal.  Skin: No rash noted. No erythema.  Psychiatric: She has a normal mood and affect. Her behavior is normal. Judgment and thought content normal.      Lab Results  Component Value Date   WBC 7.7 12/31/2010   HGB 13.7 12/31/2010   HCT 40.8 12/31/2010   PLT 253 12/31/2010   GLUCOSE 104* 05/13/2011   CHOL 153 02/06/2009   TRIG 60.0 02/06/2009   HDL 54.10 02/06/2009   LDLDIRECT 110.3 05/24/2008   LDLCALC 87 02/06/2009   ALT 18 02/11/2011   AST 21 02/11/2011   NA 138 05/13/2011   K 4.4 05/13/2011   CL 97 05/13/2011   CREATININE 1.0 05/13/2011   BUN 15 05/13/2011   CO2 29 05/13/2011   TSH 0.64 02/11/2011   INR 1.8 04/29/2011   HGBA1C 5.7 05/13/2011       Assessment & Plan:

## 2011-05-20 NOTE — Assessment & Plan Note (Signed)
Rate controlled.  On coumadin.   

## 2011-05-20 NOTE — Assessment & Plan Note (Signed)
Doing well 

## 2011-05-27 ENCOUNTER — Ambulatory Visit (INDEPENDENT_AMBULATORY_CARE_PROVIDER_SITE_OTHER): Payer: Medicare Other | Admitting: Pharmacist

## 2011-05-27 DIAGNOSIS — Z7901 Long term (current) use of anticoagulants: Secondary | ICD-10-CM

## 2011-05-27 DIAGNOSIS — I4892 Unspecified atrial flutter: Secondary | ICD-10-CM

## 2011-05-27 DIAGNOSIS — I4891 Unspecified atrial fibrillation: Secondary | ICD-10-CM | POA: Diagnosis not present

## 2011-05-27 LAB — POCT INR: INR: 2.9

## 2011-05-28 ENCOUNTER — Other Ambulatory Visit: Payer: Self-pay | Admitting: Cardiovascular Disease

## 2011-05-30 DIAGNOSIS — L259 Unspecified contact dermatitis, unspecified cause: Secondary | ICD-10-CM | POA: Diagnosis not present

## 2011-06-20 ENCOUNTER — Telehealth: Payer: Self-pay | Admitting: Internal Medicine

## 2011-06-20 NOTE — Telephone Encounter (Signed)
Called number [phone] and provided [id number]. Ferrex is not covered.

## 2011-06-20 NOTE — Telephone Encounter (Signed)
Pt requesting iron prescription faxed to [employer] to get on her insurance---Pt asking for a return call

## 2011-06-22 NOTE — Telephone Encounter (Signed)
What is covered in place of it? Thx

## 2011-06-23 NOTE — Telephone Encounter (Signed)
We do not know. All they stated was that she should get this OTC. Please advise.

## 2011-06-23 NOTE — Telephone Encounter (Signed)
Use OTC Fe sulfate 325 mg qd Thx

## 2011-06-24 ENCOUNTER — Ambulatory Visit (INDEPENDENT_AMBULATORY_CARE_PROVIDER_SITE_OTHER): Payer: Medicare Other | Admitting: *Deleted

## 2011-06-24 DIAGNOSIS — I4891 Unspecified atrial fibrillation: Secondary | ICD-10-CM | POA: Diagnosis not present

## 2011-06-24 DIAGNOSIS — I4892 Unspecified atrial flutter: Secondary | ICD-10-CM | POA: Diagnosis not present

## 2011-06-24 DIAGNOSIS — Z7901 Long term (current) use of anticoagulants: Secondary | ICD-10-CM | POA: Diagnosis not present

## 2011-06-24 LAB — POCT INR: INR: 2.7

## 2011-07-09 ENCOUNTER — Telehealth: Payer: Self-pay | Admitting: Oncology

## 2011-07-09 ENCOUNTER — Ambulatory Visit (HOSPITAL_BASED_OUTPATIENT_CLINIC_OR_DEPARTMENT_OTHER): Payer: Medicare Other | Admitting: Oncology

## 2011-07-09 ENCOUNTER — Other Ambulatory Visit (HOSPITAL_BASED_OUTPATIENT_CLINIC_OR_DEPARTMENT_OTHER): Payer: Medicare Other | Admitting: Lab

## 2011-07-09 VITALS — BP 149/84 | HR 89 | Temp 97.0°F | Wt 180.6 lb

## 2011-07-09 DIAGNOSIS — D649 Anemia, unspecified: Secondary | ICD-10-CM

## 2011-07-09 DIAGNOSIS — D509 Iron deficiency anemia, unspecified: Secondary | ICD-10-CM | POA: Diagnosis not present

## 2011-07-09 LAB — IRON AND TIBC
%SAT: 29 % (ref 20–55)
Iron: 101 ug/dL (ref 42–145)
TIBC: 345 ug/dL (ref 250–470)
UIBC: 244 ug/dL (ref 125–400)

## 2011-07-09 LAB — COMPREHENSIVE METABOLIC PANEL
ALT: 15 U/L (ref 0–35)
AST: 18 U/L (ref 0–37)
Albumin: 4.2 g/dL (ref 3.5–5.2)
Alkaline Phosphatase: 79 U/L (ref 39–117)
BUN: 16 mg/dL (ref 6–23)
CO2: 27 mEq/L (ref 19–32)
Calcium: 9.2 mg/dL (ref 8.4–10.5)
Chloride: 93 mEq/L — ABNORMAL LOW (ref 96–112)
Creatinine, Ser: 0.93 mg/dL (ref 0.50–1.10)
Glucose, Bld: 107 mg/dL — ABNORMAL HIGH (ref 70–99)
Potassium: 3.8 mEq/L (ref 3.5–5.3)
Sodium: 132 mEq/L — ABNORMAL LOW (ref 135–145)
Total Bilirubin: 1 mg/dL (ref 0.3–1.2)
Total Protein: 7.4 g/dL (ref 6.0–8.3)

## 2011-07-09 LAB — CBC & DIFF AND RETIC
BASO%: 0.5 % (ref 0.0–2.0)
Basophils Absolute: 0 10*3/uL (ref 0.0–0.1)
EOS%: 1.4 % (ref 0.0–7.0)
Eosinophils Absolute: 0.1 10*3/uL (ref 0.0–0.5)
HCT: 41.1 % (ref 34.8–46.6)
HGB: 14.1 g/dL (ref 11.6–15.9)
Immature Retic Fract: 9.9 % (ref 1.60–10.00)
LYMPH%: 16.7 % (ref 14.0–49.7)
MCH: 35.1 pg — ABNORMAL HIGH (ref 25.1–34.0)
MCHC: 34.3 g/dL (ref 31.5–36.0)
MCV: 102.2 fL — ABNORMAL HIGH (ref 79.5–101.0)
MONO#: 0.6 10*3/uL (ref 0.1–0.9)
MONO%: 10.5 % (ref 0.0–14.0)
NEUT#: 4.1 10*3/uL (ref 1.5–6.5)
NEUT%: 70.9 % (ref 38.4–76.8)
Platelets: 270 10*3/uL (ref 145–400)
RBC: 4.02 10*6/uL (ref 3.70–5.45)
RDW: 12.9 % (ref 11.2–14.5)
Retic %: 1.95 % (ref 0.70–2.10)
Retic Ct Abs: 78.39 10*3/uL (ref 33.70–90.70)
WBC: 5.7 10*3/uL (ref 3.9–10.3)
lymph#: 1 10*3/uL (ref 0.9–3.3)

## 2011-07-09 LAB — FERRITIN: Ferritin: 165 ng/mL (ref 10–291)

## 2011-07-09 NOTE — Telephone Encounter (Signed)
Gv pt appt for [id number] 

## 2011-07-09 NOTE — Progress Notes (Signed)
Hematology and Oncology Follow Up Visit  CARRIANNE HYUN [id number] 02/24/1925 78 y.o. 07/09/2011 1:52 PM  CC: Verne Carrow, MD  Rachael Fee, MD    Principle Diagnosis: This is an 77 year old female with iron deficiency anemia diagnosed November 2011 who presented with a hemoglobin of 8.7, saturation of 14%, iron level of 40.   Prior Therapy: Status post total iron replacement with Feraheme, a total of 1000 mg given November 2011.  Interim History: Mrs. Boys presents today for an office followup visit.  Her husband accompanies her.  This is a pleasant 78 year old female who had an excellent response to [facility] back in November 2011.  She had 510 mg x2 doses a week apart.  At that time her hemoglobin was 8.7.  In December it came up to 11.9 and in March it was 14.0.  Ms. Doto is currently on iron supplementation twice a day and is doing quite well. She did have a hip replacement back in May and is recovering quite nicely from that.  Overall her performance status and activity level are stable.  She does get around with a cane.  She denies any headaches, visual changes, odynophagia, dysphagia, any nausea, vomiting, diarrhea, constipation, chest pain, shortness of breath, productive cough, abdominal pain, abdominal swelling, lower extremity paresthesias, bowel or bladder incontinence, any fevers, chills or night sweats, any palpable lymph nodes, any obvious bleeding such as melena, hematochezia or hematuria.  Overall she reports she has a good appetite.  She drinks quite well.  She does not report any type of anorexia, unintentional weight loss or severe fatigue.    No bleeding noted. Medications: I have reviewed the patient's current medications. Current outpatient prescriptions:B Complex Vitamins (VITAMIN B COMPLEX) CAPS, Take 1 capsule by mouth daily. , Disp: , Rfl: ;  Cholecalciferol (VITAMIN D3) 1000 UNITS tablet, Take 1,000 Units by mouth daily.  , Disp: , Rfl: ;  clobetasol cream  (TEMOVATE) 0.05 %, As directed, Disp: , Rfl: ;  diltiazem (CARDIZEM CD) 120 MG 24 hr capsule, TAKE 1 CAPSULE EVERY DAY, Disp: 30 capsule, Rfl: 5 furosemide (LASIX) 40 MG tablet, Take 1 tablet (40 mg total) by mouth 2 (two) times daily. For swelling, Disp: 60 tablet, Rfl: 11;  hydrOXYzine (ATARAX) 25 MG tablet, Take 25-50 mg by mouth 2 (two) times daily as needed. For itching , Disp: , Rfl: ;  Iron Polysacch Cmplx-B12-FA (FERREX 150 FORTE) 150-0.025-1 MG CAPS, Take 1 capsule by mouth 2 (two) times daily., Disp: 60 each, Rfl: 3 levothyroxine (SYNTHROID, LEVOTHROID) 25 MCG tablet, 1 BY MOUTH ONCE DAILY FOR THYROID, Disp: 90 tablet, Rfl: 3;  metoprolol succinate (TOPROL XL) 50 MG 24 hr tablet, Take one tablet by mouth twice daily, Disp: 60 tablet, Rfl: 11;  pantoprazole (PROTONIX) 40 MG tablet, TAKE 1 TABLET BY MOUTH EVERY DAY, Disp: 30 tablet, Rfl: 10 potassium chloride SA (K-DUR,KLOR-CON) 20 MEQ tablet, Take 1 tablet (20 mEq total) by mouth daily., Disp: 30 tablet, Rfl: 11;  temazepam (RESTORIL) 30 MG capsule, Take 1 capsule (30 mg total) by mouth daily., Disp: 30 capsule, Rfl: 5;  traMADol (ULTRAM) 50 MG tablet, Take 50-100 mg by mouth 2 (two) times daily as needed. For pain , Disp: , Rfl: ;  vitamin C (ASCORBIC ACID) 500 MG tablet, Take 500 mg by mouth daily. ( TAKE WITH IRON), Disp: , Rfl:  warfarin (COUMADIN) 5 MG tablet, USE AS DIRECTED BY ANTICOAGULATION CLINIC, Disp: 30 tablet, Rfl: 3  Allergies:  Allergies  Allergen Reactions  .  Atenolol     REACTION: sick  . Clarithromycin     REACTION: nausea  . Codeine Sulfate     REACTION: nausea  . Macrodantin   . Nitrofurantoin   . Percocet (Oxycodone-Acetaminophen)     REACTION: n/v    Past Medical History, Surgical history, Social history, and Family History were reviewed and updated.  Review of Systems: Constitutional:  Negative for fever, chills, night sweats, anorexia, weight loss, pain. Cardiovascular: no chest pain or dyspnea on  exertion Respiratory: no cough, shortness of breath, or wheezing Neurological: no TIA or stroke symptoms Dermatological: negative ENT: negative Skin: Negative. Gastrointestinal: no abdominal pain, change in bowel habits, or black or bloody stools Genito-Urinary: no dysuria, trouble voiding, or hematuria Hematological and Lymphatic: negative Breast: negative Musculoskeletal: negative Remaining ROS negative. Physical Exam: Blood pressure 149/84, pulse 89, temperature 97 F (36.1 C), temperature source Oral, weight 180 lb 9 oz (81.903 kg). ECOG: 1 General appearance: alert Head: Normocephalic, without obvious abnormality, atraumatic Neck: no adenopathy, no carotid bruit, no JVD, supple, symmetrical, trachea midline and thyroid not enlarged, symmetric, no tenderness/mass/nodules Lymph nodes: Cervical, supraclavicular, and axillary nodes normal. Heart:regular rate and rhythm, S1, S2 normal, no murmur, click, rub or gallop Lung:chest clear, no wheezing, rales, normal symmetric air entry Abdomin: soft, non-tender, without masses or organomegaly EXT:no erythema, induration, or nodules   Lab Results: Lab Results  Component Value Date   WBC 5.7 07/09/2011   HGB 14.1 07/09/2011   HCT 41.1 07/09/2011   MCV 102.2* 07/09/2011   PLT 270 07/09/2011     Chemistry      Component Value Date/Time   NA 138 05/13/2011 0913   K 4.4 05/13/2011 0913   CL 97 05/13/2011 0913   CO2 29 05/13/2011 0913   BUN 15 05/13/2011 0913   CREATININE 1.0 05/13/2011 0913      Component Value Date/Time   CALCIUM 9.5 05/13/2011 0913   ALKPHOS 75 02/11/2011 1037   AST 21 02/11/2011 1037   ALT 18 02/11/2011 1037   BILITOT 0.6 02/11/2011 1037       Impression and Plan:   This is a pleasant 85-year female with the following issues:   1. Iron deficiency anemia.  She did have an excellent response to IV iron in the past.  She currently remains on iron supplementation twice a day.  Her hemoglobin is within normal limits at  14.1.  She will remain on her iron supplementation which I think can be switched to once a day over the counter Fe supplement.  We are rechecking her iron studies today which I think will be within normal limits; however, in the future if her iron levels are low we can go ahead and provide Feraheme. 2. Follow up: 12 months.  Eli Hose, MD 6/19/20131:52 PM

## 2011-07-18 ENCOUNTER — Telehealth: Payer: Self-pay | Admitting: *Deleted

## 2011-07-18 NOTE — Telephone Encounter (Signed)
Rf req Temazepam 30 mg 1 po qhs. Ok to Rf?

## 2011-07-20 NOTE — Telephone Encounter (Signed)
OK to fill this prescription with additional refills x5 Thank you!  

## 2011-07-21 MED ORDER — TEMAZEPAM 30 MG PO CAPS: 30.0000 mg | ORAL_CAPSULE | Freq: Every day | ORAL | Status: DC

## 2011-07-21 NOTE — Telephone Encounter (Signed)
Done

## 2011-07-22 ENCOUNTER — Ambulatory Visit (INDEPENDENT_AMBULATORY_CARE_PROVIDER_SITE_OTHER): Payer: Medicare Other | Admitting: *Deleted

## 2011-07-22 DIAGNOSIS — I4891 Unspecified atrial fibrillation: Secondary | ICD-10-CM | POA: Diagnosis not present

## 2011-07-22 DIAGNOSIS — Z7901 Long term (current) use of anticoagulants: Secondary | ICD-10-CM | POA: Diagnosis not present

## 2011-07-22 DIAGNOSIS — I4892 Unspecified atrial flutter: Secondary | ICD-10-CM

## 2011-07-22 LAB — POCT INR: INR: 2.6

## 2011-08-20 ENCOUNTER — Other Ambulatory Visit: Payer: Self-pay

## 2011-08-20 NOTE — Telephone Encounter (Signed)
.  A user error has taken place: encounter opened in error

## 2011-09-02 ENCOUNTER — Ambulatory Visit (INDEPENDENT_AMBULATORY_CARE_PROVIDER_SITE_OTHER): Payer: Medicare Other | Admitting: Pharmacist

## 2011-09-02 DIAGNOSIS — I4891 Unspecified atrial fibrillation: Secondary | ICD-10-CM | POA: Diagnosis not present

## 2011-09-02 DIAGNOSIS — I4892 Unspecified atrial flutter: Secondary | ICD-10-CM | POA: Diagnosis not present

## 2011-09-02 DIAGNOSIS — Z7901 Long term (current) use of anticoagulants: Secondary | ICD-10-CM

## 2011-09-02 LAB — POCT INR: INR: 2.8

## 2011-09-10 ENCOUNTER — Other Ambulatory Visit (INDEPENDENT_AMBULATORY_CARE_PROVIDER_SITE_OTHER): Payer: Medicare Other

## 2011-09-10 DIAGNOSIS — M4800 Spinal stenosis, site unspecified: Secondary | ICD-10-CM

## 2011-09-10 DIAGNOSIS — E039 Hypothyroidism, unspecified: Secondary | ICD-10-CM

## 2011-09-10 DIAGNOSIS — I10 Essential (primary) hypertension: Secondary | ICD-10-CM

## 2011-09-10 DIAGNOSIS — I4891 Unspecified atrial fibrillation: Secondary | ICD-10-CM

## 2011-09-10 DIAGNOSIS — N259 Disorder resulting from impaired renal tubular function, unspecified: Secondary | ICD-10-CM

## 2011-09-10 DIAGNOSIS — D649 Anemia, unspecified: Secondary | ICD-10-CM

## 2011-09-10 DIAGNOSIS — M1990 Unspecified osteoarthritis, unspecified site: Secondary | ICD-10-CM

## 2011-09-10 LAB — IBC PANEL
Iron: 68 ug/dL (ref 42–145)
Saturation Ratios: 20.9 % (ref 20.0–50.0)
Transferrin: 232 mg/dL (ref 212.0–360.0)

## 2011-09-10 LAB — BASIC METABOLIC PANEL
BUN: 12 mg/dL (ref 6–23)
CO2: 29 mEq/L (ref 19–32)
Calcium: 9 mg/dL (ref 8.4–10.5)
Chloride: 92 mEq/L — ABNORMAL LOW (ref 96–112)
Creatinine, Ser: 1.2 mg/dL (ref 0.4–1.2)
GFR: 47.32 mL/min — ABNORMAL LOW (ref >60.00)
Glucose, Bld: 104 mg/dL — ABNORMAL HIGH (ref 70–99)
Potassium: 4.1 mEq/L (ref 3.5–5.1)
Sodium: 129 mEq/L — ABNORMAL LOW (ref 135–145)

## 2011-09-10 LAB — CBC WITH DIFFERENTIAL/PLATELET
Basophils Absolute: 0 10*3/uL (ref 0.0–0.1)
Basophils Relative: 0.5 % (ref 0.0–3.0)
Eosinophils Absolute: 0.1 10*3/uL (ref 0.0–0.7)
Eosinophils Relative: 1.2 % (ref 0.0–5.0)
HCT: 39.7 % (ref 36.0–46.0)
Hemoglobin: 13.4 g/dL (ref 12.0–15.0)
Lymphocytes Relative: 13.7 % (ref 12.0–46.0)
Lymphs Abs: 1 10*3/uL (ref 0.7–4.0)
MCHC: 33.7 g/dL (ref 30.0–36.0)
MCV: 110.5 fl — ABNORMAL HIGH (ref 78.0–100.0)
Monocytes Absolute: 0.9 10*3/uL (ref 0.1–1.0)
Monocytes Relative: 13.4 % — ABNORMAL HIGH (ref 3.0–12.0)
Neutro Abs: 5 10*3/uL (ref 1.4–7.7)
Neutrophils Relative %: 71.2 % (ref 43.0–77.0)
Platelets: 226 10*3/uL (ref 150.0–400.0)
RBC: 3.59 Mil/uL — ABNORMAL LOW (ref 3.87–5.11)
RDW: 13.9 % (ref 11.5–14.6)
WBC: 7 10*3/uL (ref 4.5–10.5)

## 2011-09-17 ENCOUNTER — Ambulatory Visit (INDEPENDENT_AMBULATORY_CARE_PROVIDER_SITE_OTHER): Payer: Medicare Other | Admitting: Internal Medicine

## 2011-09-17 ENCOUNTER — Encounter: Payer: Self-pay | Admitting: Internal Medicine

## 2011-09-17 VITALS — BP 120/84 | HR 80 | Temp 98.1°F | Resp 16 | Wt 176.0 lb

## 2011-09-17 DIAGNOSIS — M545 Low back pain, unspecified: Secondary | ICD-10-CM

## 2011-09-17 DIAGNOSIS — N259 Disorder resulting from impaired renal tubular function, unspecified: Secondary | ICD-10-CM

## 2011-09-17 DIAGNOSIS — K279 Peptic ulcer, site unspecified, unspecified as acute or chronic, without hemorrhage or perforation: Secondary | ICD-10-CM | POA: Diagnosis not present

## 2011-09-17 DIAGNOSIS — D509 Iron deficiency anemia, unspecified: Secondary | ICD-10-CM

## 2011-09-17 DIAGNOSIS — E039 Hypothyroidism, unspecified: Secondary | ICD-10-CM

## 2011-09-17 DIAGNOSIS — R609 Edema, unspecified: Secondary | ICD-10-CM

## 2011-09-17 DIAGNOSIS — R202 Paresthesia of skin: Secondary | ICD-10-CM

## 2011-09-17 DIAGNOSIS — I4892 Unspecified atrial flutter: Secondary | ICD-10-CM

## 2011-09-17 DIAGNOSIS — I10 Essential (primary) hypertension: Secondary | ICD-10-CM

## 2011-09-17 DIAGNOSIS — R209 Unspecified disturbances of skin sensation: Secondary | ICD-10-CM

## 2011-09-17 NOTE — Assessment & Plan Note (Signed)
Continue with current prescription therapy as reflected on the Med list.  

## 2011-09-17 NOTE — Progress Notes (Signed)
   Subjective:    Patient ID: [name], female    DOB: 09/09/1925, 78 y.o.   MRN: [id number]  HPI   The patient is here to follow up on chronic LBP - a little better, situational depression due to LBP - better, anxiety, HTN controlled with medicines. LBP is bad w/activity  BP Readings from Last 3 Encounters:  09/17/11 120/84  07/09/11 149/84  05/20/11 130/80   Wt Readings from Last 3 Encounters:  09/17/11 176 lb (79.833 kg)  07/09/11 180 lb 9 oz (81.903 kg)  05/20/11 178 lb (80.74 kg)      Review of Systems  Constitutional: Positive for fatigue. Negative for chills, activity change, appetite change and unexpected weight change.  HENT: Negative for congestion, mouth sores and sinus pressure.   Eyes: Negative for visual disturbance.  Respiratory: Negative for cough and chest tightness.   Gastrointestinal: Negative for nausea and abdominal pain.  Genitourinary: Negative for frequency, difficulty urinating and vaginal pain.  Musculoskeletal: Positive for back pain. Negative for gait problem.  Skin: Negative for pallor and rash.  Neurological: Negative for dizziness, tremors, weakness, numbness and headaches.  Psychiatric/Behavioral: Negative for suicidal ideas, confusion and disturbed wake/sleep cycle. The patient is not nervous/anxious.        Objective:   Physical Exam  Constitutional: She appears well-developed and well-nourished. No distress.  HENT:  Head: Normocephalic.  Right Ear: External ear normal.  Left Ear: External ear normal.  Nose: Nose normal.  Mouth/Throat: Oropharynx is clear and moist.  Eyes: Conjunctivae are normal. Pupils are equal, round, and reactive to light. Right eye exhibits no discharge. Left eye exhibits no discharge.  Neck: Normal range of motion. Neck supple. No JVD present. No tracheal deviation present. No thyromegaly present.  Cardiovascular: Normal rate, regular rhythm and normal heart sounds.   Pulmonary/Chest: No stridor. No  respiratory distress. She has no wheezes.  Abdominal: Soft. Bowel sounds are normal. She exhibits no distension and no mass. There is no tenderness. There is no rebound and no guarding.  Musculoskeletal: She exhibits no edema and no tenderness.       cane  Lymphadenopathy:    She has no cervical adenopathy.  Neurological: She displays normal reflexes. No cranial nerve deficit. She exhibits normal muscle tone. Coordination normal.  Skin: No rash noted. No erythema.  Psychiatric: She has a normal mood and affect. Her behavior is normal. Judgment and thought content normal.      Lab Results  Component Value Date   WBC 7.0 09/10/2011   HGB 13.4 09/10/2011   HCT 39.7 09/10/2011   PLT 226.0 09/10/2011   GLUCOSE 104* 09/10/2011   CHOL 153 02/06/2009   TRIG 60.0 02/06/2009   HDL 54.10 02/06/2009   LDLDIRECT 110.3 05/24/2008   LDLCALC 87 02/06/2009   ALT 15 07/09/2011   AST 18 07/09/2011   NA 129* 09/10/2011   K 4.1 09/10/2011   CL 92* 09/10/2011   CREATININE 1.2 09/10/2011   BUN 12 09/10/2011   CO2 29 09/10/2011   TSH 0.64 02/11/2011   INR 2.8 09/02/2011   HGBA1C 5.7 05/13/2011       Assessment & Plan:

## 2011-09-17 NOTE — Assessment & Plan Note (Signed)
Doing well 

## 2011-09-17 NOTE — Assessment & Plan Note (Signed)
Elev MCV Dr Clelia Croft

## 2011-09-17 NOTE — Assessment & Plan Note (Signed)
Monitoring labs 

## 2011-09-17 NOTE — Assessment & Plan Note (Signed)
A little better 

## 2011-09-17 NOTE — Assessment & Plan Note (Signed)
Resolved

## 2011-09-24 ENCOUNTER — Encounter: Payer: Self-pay | Admitting: Pharmacist

## 2011-10-06 ENCOUNTER — Other Ambulatory Visit: Payer: Self-pay | Admitting: Dermatology

## 2011-10-06 DIAGNOSIS — Z85828 Personal history of other malignant neoplasm of skin: Secondary | ICD-10-CM | POA: Diagnosis not present

## 2011-10-06 DIAGNOSIS — C44529 Squamous cell carcinoma of skin of other part of trunk: Secondary | ICD-10-CM | POA: Diagnosis not present

## 2011-10-06 DIAGNOSIS — D485 Neoplasm of uncertain behavior of skin: Secondary | ICD-10-CM | POA: Diagnosis not present

## 2011-10-06 DIAGNOSIS — I8310 Varicose veins of unspecified lower extremity with inflammation: Secondary | ICD-10-CM | POA: Diagnosis not present

## 2011-10-06 DIAGNOSIS — C44621 Squamous cell carcinoma of skin of unspecified upper limb, including shoulder: Secondary | ICD-10-CM | POA: Diagnosis not present

## 2011-10-06 DIAGNOSIS — L821 Other seborrheic keratosis: Secondary | ICD-10-CM | POA: Diagnosis not present

## 2011-10-06 DIAGNOSIS — L820 Inflamed seborrheic keratosis: Secondary | ICD-10-CM | POA: Diagnosis not present

## 2011-10-06 DIAGNOSIS — D045 Carcinoma in situ of skin of trunk: Secondary | ICD-10-CM | POA: Diagnosis not present

## 2011-10-08 ENCOUNTER — Encounter: Payer: Self-pay | Admitting: Cardiovascular Disease

## 2011-10-08 ENCOUNTER — Ambulatory Visit (INDEPENDENT_AMBULATORY_CARE_PROVIDER_SITE_OTHER): Payer: Medicare Other | Admitting: Cardiovascular Disease

## 2011-10-08 VITALS — BP 138/89 | HR 79 | Ht 63.0 in | Wt 173.0 lb

## 2011-10-08 DIAGNOSIS — I10 Essential (primary) hypertension: Secondary | ICD-10-CM

## 2011-10-08 DIAGNOSIS — I4891 Unspecified atrial fibrillation: Secondary | ICD-10-CM | POA: Diagnosis not present

## 2011-10-08 NOTE — Progress Notes (Signed)
History of Present Illness: 78 yo WF with history of HTN, chronic low back pain, iron deficiency anemia and paroxysmal atrial fibrillation/flutter who is here today for follow up. She has had chronic atrial flutter/fibrillation over the past 3 years and has been managed on coumadin and rate control agents. In 2011 she had dark stools with anemia. Coumadin was held and upper endoscopy showed several small clean based gastric ulcers. She was admitted October 2011 and found to be dehydrated with acute renal failure. She was hydrated and this resolved. No further evidence of GI bleeding. She had several episodes of RVR with her atrial fib in the hospital. Coumadin has been restarted without evidence of further GI bleeding.  She had her right hip replaced per Dr. Maureen Chatters in May 2012. She has had bilateral lower ext swelling since her hip replacement in May 2012. She was seen in primary care August 23, 2010. She was started on lasix 40 mg po BID. This seemed to help the swelling. Venous dopplers negative for DVT. I last saw her in March 2013.  She is here today for follow up. She has not been aware of any tachycardia or skipped beats. No chest pain or SOB. Her back pain is stable. No bleeding issues. She is seeing Dr. Danielle Dess for her back issues now.   Primary Care Physician: [name]  Last Lipid Profile:Lipid Panel     Component Value Date/Time   CHOL 153 02/06/2009 0909   TRIG 60.0 02/06/2009 0909   HDL 54.10 02/06/2009 0909   CHOLHDL 3 02/06/2009 0909   VLDL 12.0 02/06/2009 0909   LDLCALC 87 02/06/2009 0909     Past Medical History  Diagnosis Date  . Paroxysmal atrial fibrillation      on coumadin therapy  . Hypertension   . LBP (low back pain)     severe lumbar spondylosis s/p L3/L4,L4/L5 fusion 12/10  . OA (osteoarthritis)     Dr. Juliene Pina  . Osteopenia   . Elevated MCV   . Constipation     ischemic colitis with rectal bleeding   . B12 deficiency     boderline  . Renal insufficiency     . Hypothyroidism 2011  . NSAID-associated gastropathy 2011    with bleed  . Iron deficiency anemia 2011    elevated MCV  . Peptic ulcer disease     Past Surgical History  Procedure Date  . Tonsillectomy   . Rotator cuff repair 1997    Left  . Back surgery 12/2008    L3/L4/L fusion  . Spine surgery     Current Outpatient Prescriptions  Medication Sig Dispense Refill  . B Complex Vitamins (VITAMIN B COMPLEX) CAPS Take 1 capsule by mouth daily.       . Cholecalciferol (VITAMIN D3) 1000 UNITS tablet Take 1,000 Units by mouth daily.        . clobetasol cream (TEMOVATE) 0.05 % As directed      . diltiazem (CARDIZEM CD) 120 MG 24 hr capsule TAKE 1 CAPSULE EVERY DAY  30 capsule  5  . furosemide (LASIX) 40 MG tablet Take 1 tablet (40 mg total) by mouth 2 (two) times daily. For swelling  60 tablet  11  . hydrOXYzine (ATARAX) 25 MG tablet Take 25-50 mg by mouth 2 (two) times daily as needed. For itching       . Iron Polysacch Cmplx-B12-FA (FERREX 150 FORTE) 150-0.025-1 MG CAPS Take 1 capsule by mouth 2 (two) times daily.  60 each  3  . levothyroxine (SYNTHROID, LEVOTHROID) 25 MCG tablet 1 BY MOUTH ONCE DAILY FOR THYROID  90 tablet  3  . metoprolol succinate (TOPROL XL) 50 MG 24 hr tablet Take one tablet by mouth twice daily  60 tablet  11  . pantoprazole (PROTONIX) 40 MG tablet TAKE 1 TABLET BY MOUTH EVERY DAY  30 tablet  10  . potassium chloride SA (K-DUR,KLOR-CON) 20 MEQ tablet Take 1 tablet (20 mEq total) by mouth daily.  30 tablet  11  . temazepam (RESTORIL) 30 MG capsule Take 1 capsule (30 mg total) by mouth daily.  30 capsule  5  . traMADol (ULTRAM) 50 MG tablet Take 50-100 mg by mouth 2 (two) times daily as needed. For pain       . vitamin C (ASCORBIC ACID) 500 MG tablet Take 500 mg by mouth daily. ( TAKE WITH IRON)      . warfarin (COUMADIN) 5 MG tablet USE AS DIRECTED BY ANTICOAGULATION CLINIC  30 tablet  3    Allergies  Allergen Reactions  . Atenolol     REACTION: sick  .  Clarithromycin     REACTION: nausea  . Codeine Sulfate     REACTION: nausea  . Macrodantin   . Nitrofurantoin   . Percocet (Oxycodone-Acetaminophen)     REACTION: n/v    History   Social History  . Marital Status: Married    Spouse Name: N/A    Number of Children: 2  . Years of Education: N/A   Occupational History  . retired [employer]   Social History Main Topics  . Smoking status: Never Smoker   . Smokeless tobacco: Not on file  . Alcohol Use: 4.2 oz/week    7 Glasses of wine per week     1 glass daily  . Drug Use: No  . Sexually Active: Not Currently   Other Topics Concern  . Not on file   Social History Narrative  . No narrative on file    Family History  Problem Relation Age of Onset  . Cancer Father   . Hypertension Other     Review of Systems:  As stated in the HPI and otherwise negative.   BP 138/89  Pulse 79  Ht 5\' 3"  (1.6 m)  Wt 173 lb (78.472 kg)  BMI 30.65 kg/m2  Physical Examination: General: Well developed, well nourished, NAD HEENT: OP clear, mucus membranes moist SKIN: warm, dry. No rashes. Neuro: No focal deficits Musculoskeletal: Muscle strength 5/5 all ext Psychiatric: Mood and affect normal Neck: No JVD, no carotid bruits, no thyromegaly, no lymphadenopathy. Lungs:Clear bilaterally, no wheezes, rhonci, crackles Cardiovascular: Irregular  rate and rhythm. No murmurs, gallops or rubs. Abdomen:Soft. Bowel sounds present. Non-tender.  Extremities: No lower extremity edema. Pulses are 2 + in the bilateral DP/PT. Chronic venous stasis changes.   EKG: Atrial fibrillation, rate 79 bpm. RBBB   Assessment and Plan:   1. Atrial fibrillation: Chronic persistent, rate controlled. She is anti-coagulated with coumadin with no bleeding issues. Continue beta blocker and Cardizem. Will check lipids.

## 2011-10-08 NOTE — Patient Instructions (Addendum)
Your physician wants you to follow-up in:  12 months. You will receive a reminder letter in the mail two months in advance. If you don't receive a letter, please call our office to schedule the follow-up appointment.  Your physician recommends that you return for fasting lab work later this week or next week--Lipid profile

## 2011-10-14 ENCOUNTER — Ambulatory Visit (INDEPENDENT_AMBULATORY_CARE_PROVIDER_SITE_OTHER): Payer: Medicare Other | Admitting: Pharmacist

## 2011-10-14 ENCOUNTER — Other Ambulatory Visit (INDEPENDENT_AMBULATORY_CARE_PROVIDER_SITE_OTHER): Payer: Medicare Other

## 2011-10-14 DIAGNOSIS — Z7901 Long term (current) use of anticoagulants: Secondary | ICD-10-CM | POA: Diagnosis not present

## 2011-10-14 DIAGNOSIS — I4892 Unspecified atrial flutter: Secondary | ICD-10-CM | POA: Diagnosis not present

## 2011-10-14 DIAGNOSIS — I10 Essential (primary) hypertension: Secondary | ICD-10-CM | POA: Diagnosis not present

## 2011-10-14 DIAGNOSIS — I4891 Unspecified atrial fibrillation: Secondary | ICD-10-CM

## 2011-10-14 LAB — LIPID PANEL
Cholesterol: 184 mg/dL (ref 0–200)
HDL: 51.1 mg/dL (ref >39.00)
LDL Cholesterol: 121 mg/dL — ABNORMAL HIGH (ref 0–99)
Total CHOL/HDL Ratio: 4
Triglycerides: 58 mg/dL (ref 0.0–149.0)
VLDL: 11.6 mg/dL (ref 0.0–40.0)

## 2011-10-14 LAB — POCT INR: INR: 2.6

## 2011-10-15 ENCOUNTER — Other Ambulatory Visit: Payer: Self-pay | Admitting: *Deleted

## 2011-10-15 DIAGNOSIS — E7800 Pure hypercholesterolemia, unspecified: Secondary | ICD-10-CM

## 2011-10-15 MED ORDER — ATORVASTATIN CALCIUM 10 MG PO TABS: 10.0000 mg | ORAL_TABLET | Freq: Every day | ORAL | Status: DC

## 2011-11-13 ENCOUNTER — Other Ambulatory Visit: Payer: Self-pay | Admitting: Gastroenterology

## 2011-11-14 ENCOUNTER — Other Ambulatory Visit: Payer: Self-pay | Admitting: Internal Medicine

## 2011-11-17 ENCOUNTER — Other Ambulatory Visit: Payer: Self-pay | Admitting: Internal Medicine

## 2011-11-24 ENCOUNTER — Other Ambulatory Visit: Payer: Self-pay | Admitting: Cardiovascular Disease

## 2011-11-25 ENCOUNTER — Ambulatory Visit (INDEPENDENT_AMBULATORY_CARE_PROVIDER_SITE_OTHER): Payer: Medicare Other | Admitting: *Deleted

## 2011-11-25 DIAGNOSIS — I4892 Unspecified atrial flutter: Secondary | ICD-10-CM

## 2011-11-25 DIAGNOSIS — Z7901 Long term (current) use of anticoagulants: Secondary | ICD-10-CM

## 2011-11-25 DIAGNOSIS — I4891 Unspecified atrial fibrillation: Secondary | ICD-10-CM

## 2011-11-25 LAB — POCT INR: INR: 3

## 2011-12-20 ENCOUNTER — Other Ambulatory Visit: Payer: Self-pay | Admitting: Cardiovascular Disease

## 2011-12-27 ENCOUNTER — Other Ambulatory Visit: Payer: Self-pay | Admitting: Cardiovascular Disease

## 2012-01-05 ENCOUNTER — Other Ambulatory Visit (INDEPENDENT_AMBULATORY_CARE_PROVIDER_SITE_OTHER): Payer: Medicare Other

## 2012-01-05 ENCOUNTER — Ambulatory Visit (INDEPENDENT_AMBULATORY_CARE_PROVIDER_SITE_OTHER): Payer: Medicare Other | Admitting: Pharmacist

## 2012-01-05 DIAGNOSIS — E7800 Pure hypercholesterolemia, unspecified: Secondary | ICD-10-CM

## 2012-01-05 DIAGNOSIS — I4892 Unspecified atrial flutter: Secondary | ICD-10-CM | POA: Diagnosis not present

## 2012-01-05 DIAGNOSIS — Z7901 Long term (current) use of anticoagulants: Secondary | ICD-10-CM | POA: Diagnosis not present

## 2012-01-05 DIAGNOSIS — I4891 Unspecified atrial fibrillation: Secondary | ICD-10-CM | POA: Diagnosis not present

## 2012-01-05 LAB — POCT INR: INR: 2.5

## 2012-01-05 LAB — HEPATIC FUNCTION PANEL
ALT: 20 U/L (ref 0–35)
AST: 23 U/L (ref 0–37)
Albumin: 4.1 g/dL (ref 3.5–5.2)
Alkaline Phosphatase: 85 U/L (ref 39–117)
Bilirubin, Direct: 0.2 mg/dL (ref 0.0–0.3)
Total Bilirubin: 1.8 mg/dL — ABNORMAL HIGH (ref 0.3–1.2)
Total Protein: 7.9 g/dL (ref 6.0–8.3)

## 2012-01-05 LAB — LIPID PANEL
Cholesterol: 142 mg/dL (ref 0–200)
HDL: 60.4 mg/dL (ref >39.00)
LDL Cholesterol: 70 mg/dL (ref 0–99)
Total CHOL/HDL Ratio: 2
Triglycerides: 56 mg/dL (ref 0.0–149.0)
VLDL: 11.2 mg/dL (ref 0.0–40.0)

## 2012-01-06 ENCOUNTER — Other Ambulatory Visit: Payer: Medicare Other

## 2012-01-16 ENCOUNTER — Other Ambulatory Visit: Payer: Self-pay | Admitting: Internal Medicine

## 2012-01-16 NOTE — Telephone Encounter (Signed)
Ok to Rf in PCP absence? 

## 2012-01-28 ENCOUNTER — Ambulatory Visit (INDEPENDENT_AMBULATORY_CARE_PROVIDER_SITE_OTHER): Payer: Medicare Other | Admitting: Internal Medicine

## 2012-01-28 ENCOUNTER — Encounter: Payer: Self-pay | Admitting: Internal Medicine

## 2012-01-28 VITALS — BP 140/90 | HR 80 | Temp 97.1°F | Resp 16 | Wt 180.0 lb

## 2012-01-28 DIAGNOSIS — M545 Low back pain, unspecified: Secondary | ICD-10-CM

## 2012-01-28 DIAGNOSIS — N259 Disorder resulting from impaired renal tubular function, unspecified: Secondary | ICD-10-CM | POA: Diagnosis not present

## 2012-01-28 DIAGNOSIS — R609 Edema, unspecified: Secondary | ICD-10-CM | POA: Diagnosis not present

## 2012-01-28 DIAGNOSIS — R209 Unspecified disturbances of skin sensation: Secondary | ICD-10-CM

## 2012-01-28 DIAGNOSIS — R202 Paresthesia of skin: Secondary | ICD-10-CM | POA: Insufficient documentation

## 2012-01-28 DIAGNOSIS — I10 Essential (primary) hypertension: Secondary | ICD-10-CM | POA: Diagnosis not present

## 2012-01-28 DIAGNOSIS — E559 Vitamin D deficiency, unspecified: Secondary | ICD-10-CM

## 2012-01-28 DIAGNOSIS — E039 Hypothyroidism, unspecified: Secondary | ICD-10-CM | POA: Diagnosis not present

## 2012-01-28 NOTE — Progress Notes (Signed)
   Subjective:    HPI   The patient is here to follow up on chronic LBP - a little better, situational depression due to LBP - better, anxiety, HTN controlled with medicines. LBP is bad w/activity  BP Readings from Last 3 Encounters:  01/28/12 140/90  10/08/11 138/89  09/17/11 120/84   Wt Readings from Last 3 Encounters:  01/28/12 180 lb (81.647 kg)  10/08/11 173 lb (78.472 kg)  09/17/11 176 lb (79.833 kg)      Review of Systems  Constitutional: Positive for fatigue. Negative for chills, activity change, appetite change and unexpected weight change.  HENT: Negative for congestion, mouth sores and sinus pressure.   Eyes: Negative for visual disturbance.  Respiratory: Negative for cough and chest tightness.   Gastrointestinal: Negative for nausea and abdominal pain.  Genitourinary: Negative for frequency, difficulty urinating and vaginal pain.  Musculoskeletal: Positive for back pain. Negative for gait problem.  Skin: Negative for pallor and rash.  Neurological: Negative for dizziness, tremors, weakness, numbness and headaches.  Psychiatric/Behavioral: Negative for suicidal ideas, confusion and sleep disturbance. The patient is not nervous/anxious.        Objective:   Physical Exam  Constitutional: She appears well-developed and well-nourished. No distress.  HENT:  Head: Normocephalic.  Right Ear: External ear normal.  Left Ear: External ear normal.  Nose: Nose normal.  Mouth/Throat: Oropharynx is clear and moist.  Eyes: Conjunctivae normal are normal. Pupils are equal, round, and reactive to light. Right eye exhibits no discharge. Left eye exhibits no discharge.  Neck: Normal range of motion. Neck supple. No JVD present. No tracheal deviation present. No thyromegaly present.  Cardiovascular: Normal rate, regular rhythm and normal heart sounds.   Pulmonary/Chest: No stridor. No respiratory distress. She has no wheezes.  Abdominal: Soft. Bowel sounds are normal. She  exhibits no distension and no mass. There is no tenderness. There is no rebound and no guarding.  Musculoskeletal: She exhibits no edema and no tenderness.       cane  Lymphadenopathy:    She has no cervical adenopathy.  Neurological: She displays normal reflexes. No cranial nerve deficit. She exhibits normal muscle tone. Coordination normal.  Skin: No rash noted. No erythema.  Psychiatric: She has a normal mood and affect. Her behavior is normal. Judgment and thought content normal.      Lab Results  Component Value Date   WBC 7.0 09/10/2011   HGB 13.4 09/10/2011   HCT 39.7 09/10/2011   PLT 226.0 09/10/2011   GLUCOSE 104* 09/10/2011   CHOL 142 01/05/2012   TRIG 56.0 01/05/2012   HDL 60.40 01/05/2012   LDLDIRECT 110.3 05/24/2008   LDLCALC 70 01/05/2012   ALT 20 01/05/2012   AST 23 01/05/2012   NA 129* 09/10/2011   K 4.1 09/10/2011   CL 92* 09/10/2011   CREATININE 1.2 09/10/2011   BUN 12 09/10/2011   CO2 29 09/10/2011   TSH 0.64 02/11/2011   INR 2.5 01/05/2012   HGBA1C 5.7 05/13/2011       Assessment & Plan:

## 2012-01-28 NOTE — Assessment & Plan Note (Signed)
Continue with current prescription therapy as reflected on the Med list.  

## 2012-01-28 NOTE — Assessment & Plan Note (Signed)
Watching  

## 2012-02-10 ENCOUNTER — Other Ambulatory Visit: Payer: Self-pay | Admitting: Internal Medicine

## 2012-02-17 ENCOUNTER — Ambulatory Visit (INDEPENDENT_AMBULATORY_CARE_PROVIDER_SITE_OTHER): Payer: Medicare Other | Admitting: *Deleted

## 2012-02-17 DIAGNOSIS — I4891 Unspecified atrial fibrillation: Secondary | ICD-10-CM | POA: Diagnosis not present

## 2012-02-17 DIAGNOSIS — Z7901 Long term (current) use of anticoagulants: Secondary | ICD-10-CM

## 2012-02-17 DIAGNOSIS — I4892 Unspecified atrial flutter: Secondary | ICD-10-CM | POA: Diagnosis not present

## 2012-02-17 LAB — POCT INR: INR: 2.3

## 2012-03-24 ENCOUNTER — Telehealth: Payer: Self-pay | Admitting: *Deleted

## 2012-03-24 MED ORDER — TEMAZEPAM 30 MG PO CAPS: ORAL_CAPSULE | ORAL | Status: DC

## 2012-03-24 NOTE — Telephone Encounter (Signed)
REFILL APPROVED FOR MEDICATION TEMAZOPAM  FOR ONE YEAR START 03/24/2012

## 2012-03-25 NOTE — Telephone Encounter (Signed)
Prescription printed and given to Lanier Prude for doctor to sign.

## 2012-03-30 ENCOUNTER — Ambulatory Visit (INDEPENDENT_AMBULATORY_CARE_PROVIDER_SITE_OTHER): Payer: Medicare Other | Admitting: Pharmacist

## 2012-03-30 DIAGNOSIS — I4891 Unspecified atrial fibrillation: Secondary | ICD-10-CM

## 2012-03-30 DIAGNOSIS — I4892 Unspecified atrial flutter: Secondary | ICD-10-CM | POA: Diagnosis not present

## 2012-03-30 DIAGNOSIS — Z7901 Long term (current) use of anticoagulants: Secondary | ICD-10-CM

## 2012-03-30 LAB — POCT INR: INR: 2.5

## 2012-04-13 ENCOUNTER — Other Ambulatory Visit: Payer: Self-pay | Admitting: Dermatology

## 2012-04-13 DIAGNOSIS — L570 Actinic keratosis: Secondary | ICD-10-CM | POA: Diagnosis not present

## 2012-04-13 DIAGNOSIS — D485 Neoplasm of uncertain behavior of skin: Secondary | ICD-10-CM | POA: Diagnosis not present

## 2012-04-13 DIAGNOSIS — C44519 Basal cell carcinoma of skin of other part of trunk: Secondary | ICD-10-CM | POA: Diagnosis not present

## 2012-04-13 DIAGNOSIS — L821 Other seborrheic keratosis: Secondary | ICD-10-CM | POA: Diagnosis not present

## 2012-04-13 DIAGNOSIS — Z85828 Personal history of other malignant neoplasm of skin: Secondary | ICD-10-CM | POA: Diagnosis not present

## 2012-04-13 DIAGNOSIS — D239 Other benign neoplasm of skin, unspecified: Secondary | ICD-10-CM | POA: Diagnosis not present

## 2012-04-13 DIAGNOSIS — C44319 Basal cell carcinoma of skin of other parts of face: Secondary | ICD-10-CM | POA: Diagnosis not present

## 2012-04-26 ENCOUNTER — Other Ambulatory Visit: Payer: Self-pay | Admitting: Cardiovascular Disease

## 2012-05-04 DIAGNOSIS — C44319 Basal cell carcinoma of skin of other parts of face: Secondary | ICD-10-CM | POA: Diagnosis not present

## 2012-05-11 ENCOUNTER — Ambulatory Visit (INDEPENDENT_AMBULATORY_CARE_PROVIDER_SITE_OTHER): Payer: Medicare Other | Admitting: Pharmacist

## 2012-05-11 DIAGNOSIS — I4891 Unspecified atrial fibrillation: Secondary | ICD-10-CM | POA: Diagnosis not present

## 2012-05-11 DIAGNOSIS — Z7901 Long term (current) use of anticoagulants: Secondary | ICD-10-CM | POA: Diagnosis not present

## 2012-05-11 DIAGNOSIS — I4892 Unspecified atrial flutter: Secondary | ICD-10-CM | POA: Diagnosis not present

## 2012-05-11 LAB — POCT INR: INR: 2.1

## 2012-05-31 ENCOUNTER — Other Ambulatory Visit: Payer: Self-pay | Admitting: Cardiovascular Disease

## 2012-06-03 ENCOUNTER — Other Ambulatory Visit: Payer: Self-pay | Admitting: Cardiovascular Disease

## 2012-06-15 ENCOUNTER — Emergency Department (HOSPITAL_COMMUNITY)
Admission: EM | Admit: 2012-06-15 | Discharge: 2012-06-15 | Disposition: A | Discharge status: Home / Self Care | Payer: Medicare Other | Attending: Emergency Medicine | Admitting: Emergency Medicine

## 2012-06-15 ENCOUNTER — Emergency Department (HOSPITAL_COMMUNITY): Payer: Medicare Other

## 2012-06-15 ENCOUNTER — Encounter (HOSPITAL_COMMUNITY): Payer: Self-pay | Admitting: Cardiology

## 2012-06-15 DIAGNOSIS — I10 Essential (primary) hypertension: Secondary | ICD-10-CM | POA: Diagnosis not present

## 2012-06-15 DIAGNOSIS — Z79899 Other long term (current) drug therapy: Secondary | ICD-10-CM | POA: Diagnosis not present

## 2012-06-15 DIAGNOSIS — Z981 Arthrodesis status: Secondary | ICD-10-CM | POA: Insufficient documentation

## 2012-06-15 DIAGNOSIS — Z8719 Personal history of other diseases of the digestive system: Secondary | ICD-10-CM | POA: Diagnosis not present

## 2012-06-15 DIAGNOSIS — M810 Age-related osteoporosis without current pathological fracture: Secondary | ICD-10-CM | POA: Insufficient documentation

## 2012-06-15 DIAGNOSIS — Z87448 Personal history of other diseases of urinary system: Secondary | ICD-10-CM | POA: Diagnosis not present

## 2012-06-15 DIAGNOSIS — J984 Other disorders of lung: Secondary | ICD-10-CM | POA: Diagnosis not present

## 2012-06-15 DIAGNOSIS — E538 Deficiency of other specified B group vitamins: Secondary | ICD-10-CM | POA: Insufficient documentation

## 2012-06-15 DIAGNOSIS — M545 Low back pain, unspecified: Secondary | ICD-10-CM | POA: Diagnosis not present

## 2012-06-15 DIAGNOSIS — R6883 Chills (without fever): Secondary | ICD-10-CM | POA: Diagnosis not present

## 2012-06-15 DIAGNOSIS — M1990 Unspecified osteoarthritis, unspecified site: Secondary | ICD-10-CM | POA: Diagnosis not present

## 2012-06-15 DIAGNOSIS — E039 Hypothyroidism, unspecified: Secondary | ICD-10-CM | POA: Diagnosis not present

## 2012-06-15 DIAGNOSIS — I4891 Unspecified atrial fibrillation: Secondary | ICD-10-CM | POA: Diagnosis not present

## 2012-06-15 DIAGNOSIS — J189 Pneumonia, unspecified organism: Secondary | ICD-10-CM | POA: Diagnosis not present

## 2012-06-15 DIAGNOSIS — Z8711 Personal history of peptic ulcer disease: Secondary | ICD-10-CM | POA: Insufficient documentation

## 2012-06-15 DIAGNOSIS — M549 Dorsalgia, unspecified: Secondary | ICD-10-CM | POA: Diagnosis not present

## 2012-06-15 DIAGNOSIS — M4800 Spinal stenosis, site unspecified: Secondary | ICD-10-CM | POA: Diagnosis not present

## 2012-06-15 DIAGNOSIS — D509 Iron deficiency anemia, unspecified: Secondary | ICD-10-CM | POA: Insufficient documentation

## 2012-06-15 LAB — URINE MICROSCOPIC-ADD ON

## 2012-06-15 LAB — COMPREHENSIVE METABOLIC PANEL
ALT: 20 U/L (ref 0–35)
AST: 23 U/L (ref 0–37)
Albumin: 3.8 g/dL (ref 3.5–5.2)
Alkaline Phosphatase: 83 U/L (ref 39–117)
BUN: 11 mg/dL (ref 6–23)
CO2: 26 mEq/L (ref 19–32)
Calcium: 9.5 mg/dL (ref 8.4–10.5)
Chloride: 93 mEq/L — ABNORMAL LOW (ref 96–112)
Creatinine, Ser: 0.79 mg/dL (ref 0.50–1.10)
GFR calc Af Amer: 86 mL/min — ABNORMAL LOW (ref >90)
GFR calc non Af Amer: 74 mL/min — ABNORMAL LOW (ref >90)
Glucose, Bld: 108 mg/dL — ABNORMAL HIGH (ref 70–99)
Potassium: 3.2 mEq/L — ABNORMAL LOW (ref 3.5–5.1)
Sodium: 133 mEq/L — ABNORMAL LOW (ref 135–145)
Total Bilirubin: 2.2 mg/dL — ABNORMAL HIGH (ref 0.3–1.2)
Total Protein: 8.2 g/dL (ref 6.0–8.3)

## 2012-06-15 LAB — URINALYSIS, ROUTINE W REFLEX MICROSCOPIC
Bilirubin Urine: NEGATIVE
Glucose, UA: NEGATIVE mg/dL
Ketones, ur: NEGATIVE mg/dL
Nitrite: NEGATIVE
Protein, ur: NEGATIVE mg/dL
Specific Gravity, Urine: 1.007 (ref 1.005–1.030)
Urobilinogen, UA: 1 mg/dL (ref 0.0–1.0)
pH: 7.5 (ref 5.0–8.0)

## 2012-06-15 LAB — CBC WITH DIFFERENTIAL/PLATELET
Basophils Absolute: 0 10*3/uL (ref 0.0–0.1)
Basophils Relative: 0 % (ref 0–1)
Eosinophils Absolute: 0 10*3/uL (ref 0.0–0.7)
Eosinophils Relative: 0 % (ref 0–5)
HCT: 39.9 % (ref 36.0–46.0)
Hemoglobin: 14.7 g/dL (ref 12.0–15.0)
Lymphocytes Relative: 8 % — ABNORMAL LOW (ref 12–46)
Lymphs Abs: 0.8 10*3/uL (ref 0.7–4.0)
MCH: 38.9 pg — ABNORMAL HIGH (ref 26.0–34.0)
MCHC: 36.8 g/dL — ABNORMAL HIGH (ref 30.0–36.0)
MCV: 105.6 fL — ABNORMAL HIGH (ref 78.0–100.0)
Monocytes Absolute: 1.6 10*3/uL — ABNORMAL HIGH (ref 0.1–1.0)
Monocytes Relative: 15 % — ABNORMAL HIGH (ref 3–12)
Neutro Abs: 7.7 10*3/uL (ref 1.7–7.7)
Neutrophils Relative %: 77 % (ref 43–77)
Platelets: 245 10*3/uL (ref 150–400)
RBC: 3.78 MIL/uL — ABNORMAL LOW (ref 3.87–5.11)
RDW: 12.7 % (ref 11.5–15.5)
WBC: 10.1 10*3/uL (ref 4.0–10.5)

## 2012-06-15 MED ORDER — LEVOFLOXACIN 750 MG PO TABS
750.0000 mg | ORAL_TABLET | Freq: Once | ORAL | Status: AC
  Administered 2012-06-15: 750 mg via ORAL
  Filled 2012-06-15: qty 1

## 2012-06-15 MED ORDER — LEVOFLOXACIN 750 MG PO TABS: 750.0000 mg | ORAL_TABLET | Freq: Every day | ORAL | Status: DC

## 2012-06-15 NOTE — ED Provider Notes (Signed)
History     CSN: [id number]  Arrival date & time 06/15/12  0750   First MD Initiated Contact with Patient 06/15/12 [phone]      Chief Complaint  Patient presents with  . Back Pain    (Consider location/radiation/quality/duration/timing/severity/associated sxs/prior treatment) HPI Comments: Patient presents with a two day history of worsening low back discomfort.  She has a history of the same since having back surgery in 2010.  She normally has some degree of discomfort, however it is worse the past two days.  She denies any injury or trauma.  She does report feeling chilled and nauseated intermittently since last night.  Patient is a 79 y.o. female presenting with back pain. The history is provided by the patient.  Back Pain Location:  Lumbar spine Quality:  Aching Radiates to:  Does not radiate Pain severity:  Moderate Pain is:  Same all the time Onset quality:  Gradual Duration:  2 days Timing:  Constant Progression:  Worsening Chronicity:  New Relieved by:  Nothing Worsened by:  Nothing tried Ineffective treatments:  None tried Associated symptoms: no dysuria     Past Medical History  Diagnosis Date  . Paroxysmal atrial fibrillation      on coumadin therapy  . Hypertension   . LBP (low back pain)     severe lumbar spondylosis s/p L3/L4,L4/L5 fusion 12/10  . OA (osteoarthritis)     Dr. Juliene Pina  . Osteopenia   . Elevated MCV   . Constipation     ischemic colitis with rectal bleeding   . B12 deficiency     boderline  . Renal insufficiency   . Hypothyroidism 2011  . NSAID-associated gastropathy 2011    with bleed  . Iron deficiency anemia 2011    elevated MCV  . Peptic ulcer disease     Past Surgical History  Procedure Laterality Date  . Tonsillectomy    . Rotator cuff repair  1997    Left  . Back surgery  12/2008    L3/L4/L fusion  . Spine surgery      Family History  Problem Relation Age of Onset  . Cancer Father   . Hypertension Other      History  Substance Use Topics  . Smoking status: Never Smoker   . Smokeless tobacco: Not on file  . Alcohol Use: 4.2 oz/week    7 Glasses of wine per week     Comment: 1 glass daily    OB History   Grav Para Term Preterm Abortions TAB SAB Ect Mult Living                  Review of Systems  Constitutional: Positive for chills.  Genitourinary: Negative for dysuria.  Musculoskeletal: Positive for back pain.  All other systems reviewed and are negative.    Allergies  Atenolol; Clarithromycin; Codeine sulfate; Macrodantin; Nitrofurantoin; and Percocet  Home Medications   Current Outpatient Rx  Name  Route  Sig  Dispense  Refill  . atorvastatin (LIPITOR) 10 MG tablet   Oral   Take 1 tablet (10 mg total) by mouth daily.   30 tablet   11   . B Complex Vitamins (VITAMIN B COMPLEX) CAPS   Oral   Take 1 capsule by mouth daily.          . Cholecalciferol (VITAMIN D3) 1000 UNITS tablet   Oral   Take 1,000 Units by mouth daily.           [name]  clobetasol cream (TEMOVATE) 0.05 %      As directed         . diltiazem (CARDIZEM CD) 120 MG 24 hr capsule      TAKE 1 CAPSULE EVERY DAY   30 capsule   5   . furosemide (LASIX) 40 MG tablet      TAKE 1 TABLET (40 MG TOTAL) BY MOUTH 2 (TWO) TIMES DAILY. FOR SWELLING   60 tablet   4   . hydrOXYzine (ATARAX) 25 MG tablet   Oral   Take 25-50 mg by mouth 2 (two) times daily as needed. For itching          . Iron Polysacch Cmplx-B12-FA 150-0.025-1 MG CAPS   Oral   Take 1 capsule by mouth once.         [name] KLOR-CON M20 20 MEQ tablet      TAKE 1 TABLET (20 MEQ TOTAL) BY MOUTH DAILY.   30 tablet   11   . levothyroxine (SYNTHROID, LEVOTHROID) 25 MCG tablet      1 TABLET BY MOUTH ONCE DAILY FOR THYROID   90 tablet   3   . metoprolol succinate (TOPROL-XL) 50 MG 24 hr tablet      TAKE 1 TABLET BY MOUTH 2 TIMES A DAY   60 tablet   4   . pantoprazole (PROTONIX) 40 MG tablet      TAKE 1 TABLET BY MOUTH EVERY  DAY   30 tablet   10   . temazepam (RESTORIL) 30 MG capsule      TAKE 1 CAPSULE AT BEDTIME AS NEEDED PRIOR AUTHORIZATION APPROVE 03/24/12 TO 03/24/13   30 capsule   4   . traMADol (ULTRAM) 50 MG tablet   Oral   Take 50-100 mg by mouth 2 (two) times daily as needed. For pain          . vitamin C (ASCORBIC ACID) 500 MG tablet   Oral   Take 500 mg by mouth daily. ( TAKE WITH IRON)         . warfarin (COUMADIN) 5 MG tablet      USE AS DIRECTED BY ANTICOAGULATION CLINIC   30 tablet   3     BP 146/84  Pulse 91  Temp(Src) 99.1 F (37.3 C) (Oral)  Resp 18  SpO2 95%  Physical Exam  Nursing note and vitals reviewed. Constitutional: She is oriented to person, place, and time. She appears well-developed and well-nourished. No distress.  HENT:  Head: Normocephalic and atraumatic.  Neck: Normal range of motion. Neck supple.  Cardiovascular: Normal rate and regular rhythm.  Exam reveals no gallop and no friction rub.   No murmur heard. Pulmonary/Chest: Effort normal and breath sounds normal. No respiratory distress. She has no wheezes.  Abdominal: Soft. Bowel sounds are normal. She exhibits no distension. There is no tenderness.  Musculoskeletal: Normal range of motion.  There is mild, ttp in the lower back soft tissues.    Neurological: She is alert and oriented to person, place, and time.  Skin: Skin is warm and dry. She is not diaphoretic.    ED Course  Procedures (including critical care time)  Labs Reviewed  CBC WITH DIFFERENTIAL  COMPREHENSIVE METABOLIC PANEL  URINALYSIS, ROUTINE W REFLEX MICROSCOPIC   No results found.   No diagnosis found.    MDM  The patient presents with complaints of chills, back pain, and she does report some cough.  The workup reveals a right-sided infiltrate on  chest xray but no fever or wbc.  She does not appear toxic and is not hypoxic.  I believe she is stable for discharge with zithromax.  To return prn.         Geoffery Lyons, MD 06/15/12 1038

## 2012-06-15 NOTE — ED Notes (Signed)
Pt to department via [facility]- pt reports lower back spasms that started about 2 days ago. Reports that she has also had some chills and nausea at times. Bp-140/88 Hr-83 RR-20

## 2012-06-16 LAB — URINE CULTURE: Colony Count: 5000

## 2012-06-22 ENCOUNTER — Ambulatory Visit (INDEPENDENT_AMBULATORY_CARE_PROVIDER_SITE_OTHER): Payer: Medicare Other

## 2012-06-22 DIAGNOSIS — Z7901 Long term (current) use of anticoagulants: Secondary | ICD-10-CM | POA: Diagnosis not present

## 2012-06-22 DIAGNOSIS — I4892 Unspecified atrial flutter: Secondary | ICD-10-CM | POA: Diagnosis not present

## 2012-06-22 DIAGNOSIS — I4891 Unspecified atrial fibrillation: Secondary | ICD-10-CM

## 2012-06-22 LAB — POCT INR: INR: 1.7

## 2012-06-25 ENCOUNTER — Encounter: Payer: Self-pay | Admitting: Internal Medicine

## 2012-06-25 ENCOUNTER — Ambulatory Visit (INDEPENDENT_AMBULATORY_CARE_PROVIDER_SITE_OTHER): Payer: Medicare Other | Admitting: Internal Medicine

## 2012-06-25 ENCOUNTER — Ambulatory Visit (INDEPENDENT_AMBULATORY_CARE_PROVIDER_SITE_OTHER): Payer: Medicare Other

## 2012-06-25 VITALS — BP 130/82 | HR 80 | Temp 97.8°F | Resp 16 | Wt 172.0 lb

## 2012-06-25 DIAGNOSIS — I10 Essential (primary) hypertension: Secondary | ICD-10-CM | POA: Diagnosis not present

## 2012-06-25 DIAGNOSIS — R5381 Other malaise: Secondary | ICD-10-CM

## 2012-06-25 DIAGNOSIS — E039 Hypothyroidism, unspecified: Secondary | ICD-10-CM

## 2012-06-25 DIAGNOSIS — R209 Unspecified disturbances of skin sensation: Secondary | ICD-10-CM | POA: Diagnosis not present

## 2012-06-25 DIAGNOSIS — M545 Low back pain, unspecified: Secondary | ICD-10-CM | POA: Diagnosis not present

## 2012-06-25 DIAGNOSIS — I4892 Unspecified atrial flutter: Secondary | ICD-10-CM | POA: Diagnosis not present

## 2012-06-25 DIAGNOSIS — R202 Paresthesia of skin: Secondary | ICD-10-CM

## 2012-06-25 DIAGNOSIS — R5383 Other fatigue: Secondary | ICD-10-CM | POA: Diagnosis not present

## 2012-06-25 DIAGNOSIS — R609 Edema, unspecified: Secondary | ICD-10-CM | POA: Diagnosis not present

## 2012-06-25 DIAGNOSIS — M4800 Spinal stenosis, site unspecified: Secondary | ICD-10-CM

## 2012-06-25 DIAGNOSIS — N259 Disorder resulting from impaired renal tubular function, unspecified: Secondary | ICD-10-CM | POA: Diagnosis not present

## 2012-06-25 DIAGNOSIS — E559 Vitamin D deficiency, unspecified: Secondary | ICD-10-CM | POA: Diagnosis not present

## 2012-06-25 LAB — HEPATIC FUNCTION PANEL
ALT: 31 U/L (ref 0–35)
AST: 27 U/L (ref 0–37)
Albumin: 3.7 g/dL (ref 3.5–5.2)
Alkaline Phosphatase: 73 U/L (ref 39–117)
Bilirubin, Direct: 0.3 mg/dL (ref 0.0–0.3)
Total Bilirubin: 1.4 mg/dL — ABNORMAL HIGH (ref 0.3–1.2)
Total Protein: 8.2 g/dL (ref 6.0–8.3)

## 2012-06-25 LAB — LIPID PANEL
Cholesterol: 116 mg/dL (ref 0–200)
HDL: 41.5 mg/dL (ref >39.00)
LDL Cholesterol: 66 mg/dL (ref 0–99)
Total CHOL/HDL Ratio: 3
Triglycerides: 41 mg/dL (ref 0.0–149.0)
VLDL: 8.2 mg/dL (ref 0.0–40.0)

## 2012-06-25 LAB — URINALYSIS, ROUTINE W REFLEX MICROSCOPIC
Bilirubin Urine: NEGATIVE
Hgb urine dipstick: NEGATIVE
Ketones, ur: NEGATIVE
Nitrite: NEGATIVE
Specific Gravity, Urine: <=1.005 (ref 1.000–1.030)
Total Protein, Urine: NEGATIVE
Urine Glucose: NEGATIVE
Urobilinogen, UA: 0.2 (ref 0.0–1.0)
pH: 6.5 (ref 5.0–8.0)

## 2012-06-25 LAB — GLUCOSE, POCT (MANUAL RESULT ENTRY): POC Glucose: 111 mg/dl — AB (ref 70–99)

## 2012-06-25 LAB — CBC WITH DIFFERENTIAL/PLATELET
Basophils Absolute: 0.1 10*3/uL (ref 0.0–0.1)
Basophils Relative: 0.9 % (ref 0.0–3.0)
Eosinophils Absolute: 0.1 10*3/uL (ref 0.0–0.7)
Eosinophils Relative: 1.5 % (ref 0.0–5.0)
HCT: 41.3 % (ref 36.0–46.0)
Hemoglobin: 14.6 g/dL (ref 12.0–15.0)
Lymphocytes Relative: 17 % (ref 12.0–46.0)
Lymphs Abs: 1.3 10*3/uL (ref 0.7–4.0)
MCHC: 35.3 g/dL (ref 30.0–36.0)
MCV: 112.6 fl — ABNORMAL HIGH (ref 78.0–100.0)
Monocytes Absolute: 0.8 10*3/uL (ref 0.1–1.0)
Monocytes Relative: 11 % (ref 3.0–12.0)
Neutro Abs: 5.2 10*3/uL (ref 1.4–7.7)
Neutrophils Relative %: 69.6 % (ref 43.0–77.0)
Platelets: 403 10*3/uL — ABNORMAL HIGH (ref 150.0–400.0)
RBC: 3.67 Mil/uL — ABNORMAL LOW (ref 3.87–5.11)
RDW: 12.9 % (ref 11.5–14.6)
WBC: 7.4 10*3/uL (ref 4.5–10.5)

## 2012-06-25 LAB — BASIC METABOLIC PANEL
BUN: 13 mg/dL (ref 6–23)
CO2: 24 mEq/L (ref 19–32)
Calcium: 9.5 mg/dL (ref 8.4–10.5)
Chloride: 98 mEq/L (ref 96–112)
Creatinine, Ser: 1 mg/dL (ref 0.4–1.2)
GFR: 54.18 mL/min — ABNORMAL LOW (ref >60.00)
Glucose, Bld: 99 mg/dL (ref 70–99)
Potassium: 5 mEq/L (ref 3.5–5.1)
Sodium: 134 mEq/L — ABNORMAL LOW (ref 135–145)

## 2012-06-25 MED ORDER — FUROSEMIDE 40 MG PO TABS: 40.0000 mg | ORAL_TABLET | Freq: Every day | ORAL | Status: DC | PRN

## 2012-06-25 MED ORDER — TRAMADOL HCL 50 MG PO TABS: 50.0000 mg | ORAL_TABLET | Freq: Two times a day (BID) | ORAL | Status: DC | PRN

## 2012-06-25 NOTE — Patient Instructions (Signed)
Wt Readings from Last 3 Encounters:  06/25/12 172 lb (78.019 kg)  01/28/12 180 lb (81.647 kg)  10/08/11 173 lb (78.472 kg)

## 2012-06-25 NOTE — Progress Notes (Signed)
   Subjective:    HPI  C/o cough, s/p E visit  C/o fatigue The patient is here to follow up on chronic LBP - a little better, situational depression due to LBP - better, anxiety, HTN controlled with medicines. LBP is bad w/activity  BP Readings from Last 3 Encounters:  06/25/12 130/82  06/15/12 141/101  01/28/12 140/90   Wt Readings from Last 3 Encounters:  06/25/12 172 lb (78.019 kg)  01/28/12 180 lb (81.647 kg)  10/08/11 173 lb (78.472 kg)      Review of Systems  Constitutional: Positive for fatigue. Negative for chills, activity change, appetite change and unexpected weight change.  HENT: Negative for congestion, mouth sores and sinus pressure.   Eyes: Negative for visual disturbance.  Respiratory: Negative for cough and chest tightness.   Gastrointestinal: Negative for nausea and abdominal pain.  Genitourinary: Negative for frequency, difficulty urinating and vaginal pain.  Musculoskeletal: Positive for back pain. Negative for gait problem.  Skin: Negative for pallor and rash.  Neurological: Negative for dizziness, tremors, weakness, numbness and headaches.  Psychiatric/Behavioral: Negative for suicidal ideas, confusion and sleep disturbance. The patient is not nervous/anxious.        Objective:   Physical Exam  Constitutional: She appears well-developed and well-nourished. No distress.  HENT:  Head: Normocephalic.  Right Ear: External ear normal.  Left Ear: External ear normal.  Nose: Nose normal.  Mouth/Throat: Oropharynx is clear and moist.  Eyes: Conjunctivae are normal. Pupils are equal, round, and reactive to light. Right eye exhibits no discharge. Left eye exhibits no discharge.  Neck: Normal range of motion. Neck supple. No JVD present. No tracheal deviation present. No thyromegaly present.  Cardiovascular: Normal rate, regular rhythm and normal heart sounds.   Pulmonary/Chest: No stridor. No respiratory distress. She has no wheezes.  Abdominal: Soft.  Bowel sounds are normal. She exhibits no distension and no mass. There is no tenderness. There is no rebound and no guarding.  Musculoskeletal: She exhibits no edema and no tenderness.  cane  Lymphadenopathy:    She has no cervical adenopathy.  Neurological: She displays normal reflexes. No cranial nerve deficit. She exhibits normal muscle tone. Coordination normal.  Skin: No rash noted. No erythema.  Psychiatric: She has a normal mood and affect. Her behavior is normal. Judgment and thought content normal.      Lab Results  Component Value Date   WBC 10.1 06/15/2012   HGB 14.7 06/15/2012   HCT 39.9 06/15/2012   PLT 245 06/15/2012   GLUCOSE 108* 06/15/2012   CHOL 142 01/05/2012   TRIG 56.0 01/05/2012   HDL 60.40 01/05/2012   LDLDIRECT 110.3 05/24/2008   LDLCALC 70 01/05/2012   ALT 20 06/15/2012   AST 23 06/15/2012   NA 133* 06/15/2012   K 3.2* 06/15/2012   CL 93* 06/15/2012   CREATININE 0.79 06/15/2012   BUN 11 06/15/2012   CO2 26 06/15/2012   TSH 0.64 02/11/2011   INR 1.7 06/22/2012   HGBA1C 5.7 05/13/2011       Assessment & Plan:

## 2012-06-26 LAB — VITAMIN D 25 HYDROXY (VIT D DEFICIENCY, FRACTURES): Vit D, 25-Hydroxy: 69 ng/mL (ref 30–89)

## 2012-06-28 ENCOUNTER — Other Ambulatory Visit: Payer: Self-pay | Admitting: Internal Medicine

## 2012-06-28 LAB — VITAMIN B12: Vitamin B-12: >1500 pg/mL — ABNORMAL HIGH (ref 211–911)

## 2012-06-28 LAB — TSH: TSH: 0.44 u[IU]/mL (ref 0.35–5.50)

## 2012-06-28 MED ORDER — CIPROFLOXACIN HCL 250 MG PO TABS: 250.0000 mg | ORAL_TABLET | Freq: Two times a day (BID) | ORAL | Status: DC

## 2012-06-29 NOTE — Assessment & Plan Note (Signed)
UA

## 2012-06-29 NOTE — Assessment & Plan Note (Signed)
Continue with current prescription therapy as reflected on the Med list.  

## 2012-06-29 NOTE — Assessment & Plan Note (Signed)
Continue with current prescription therapy as reflected on the Med list.  UA 

## 2012-07-06 ENCOUNTER — Ambulatory Visit (INDEPENDENT_AMBULATORY_CARE_PROVIDER_SITE_OTHER): Payer: Medicare Other

## 2012-07-06 DIAGNOSIS — Z7901 Long term (current) use of anticoagulants: Secondary | ICD-10-CM

## 2012-07-06 DIAGNOSIS — I4892 Unspecified atrial flutter: Secondary | ICD-10-CM | POA: Diagnosis not present

## 2012-07-06 DIAGNOSIS — I4891 Unspecified atrial fibrillation: Secondary | ICD-10-CM | POA: Diagnosis not present

## 2012-07-06 LAB — POCT INR: INR: 2.1

## 2012-07-08 ENCOUNTER — Other Ambulatory Visit (HOSPITAL_BASED_OUTPATIENT_CLINIC_OR_DEPARTMENT_OTHER): Payer: Medicare Other | Admitting: Lab

## 2012-07-08 ENCOUNTER — Ambulatory Visit (HOSPITAL_BASED_OUTPATIENT_CLINIC_OR_DEPARTMENT_OTHER): Payer: Medicare Other | Admitting: Oncology

## 2012-07-08 VITALS — BP 133/77 | HR 74 | Temp 97.6°F | Resp 18 | Ht 63.0 in | Wt 174.6 lb

## 2012-07-08 DIAGNOSIS — D509 Iron deficiency anemia, unspecified: Secondary | ICD-10-CM

## 2012-07-08 LAB — CBC WITH DIFFERENTIAL/PLATELET
BASO%: 1.2 % (ref 0.0–2.0)
Basophils Absolute: 0.1 10*3/uL (ref 0.0–0.1)
EOS%: 3.2 % (ref 0.0–7.0)
Eosinophils Absolute: 0.2 10*3/uL (ref 0.0–0.5)
HCT: 39.5 % (ref 34.8–46.6)
HGB: 13.7 g/dL (ref 11.6–15.9)
LYMPH%: 24.5 % (ref 14.0–49.7)
MCH: 36.5 pg — ABNORMAL HIGH (ref 25.1–34.0)
MCHC: 34.8 g/dL (ref 31.5–36.0)
MCV: 104.8 fL — ABNORMAL HIGH (ref 79.5–101.0)
MONO#: 0.6 10*3/uL (ref 0.1–0.9)
MONO%: 10.4 % (ref 0.0–14.0)
NEUT#: 3.6 10*3/uL (ref 1.5–6.5)
NEUT%: 60.7 % (ref 38.4–76.8)
Platelets: 313 10*3/uL (ref 145–400)
RBC: 3.77 10*6/uL (ref 3.70–5.45)
RDW: 12.4 % (ref 11.2–14.5)
WBC: 5.9 10*3/uL (ref 3.9–10.3)
lymph#: 1.5 10*3/uL (ref 0.9–3.3)

## 2012-07-08 LAB — IRON AND TIBC
%SAT: 38 % (ref 20–55)
Iron: 95 ug/dL (ref 42–145)
TIBC: 250 ug/dL (ref 250–470)
UIBC: 155 ug/dL (ref 125–400)

## 2012-07-08 LAB — FERRITIN: Ferritin: 599 ng/mL — ABNORMAL HIGH (ref 10–291)

## 2012-07-08 NOTE — Progress Notes (Signed)
Hematology and Oncology Follow Up Visit  [name] [id number] [birth year] 79 y.o. 07/08/2012 3:01 PM  CC: Verne Carrow, MD  Rachael Fee, MD    Principle Diagnosis: This is an 79 year old female with iron deficiency anemia diagnosed November 2011 who presented with a hemoglobin of 8.7, saturation of 14%, iron level of 40.  Prior Therapy: Status post total iron replacement with Feraheme, a total of 1000 mg given November 2011. She has not need any treatments since then.   Current therapy: Daily oral Fe supplement.   Interim History: [name] presents today for an office followup visit.  Her husband accompanies her.  This is a pleasant 79 year old female with the above history. [name] is currently on iron supplementation once a day and is doing quite well.  Overall her performance status and activity level are stable.  She does get around with a cane.  She denies any headaches, visual changes, odynophagia, dysphagia, any nausea, vomiting, diarrhea, constipation, chest pain, shortness of breath, productive cough, abdominal pain, abdominal swelling, lower extremity paresthesias, bowel or bladder incontinence, any fevers, chills or night sweats, any palpable lymph nodes, any obvious bleeding such as melena, hematochezia or hematuria.  Overall she reports she has a good appetite.  She drinks quite well.  She does not report any type of anorexia, unintentional weight loss or severe fatigue.   No bleeding noted.   Medications: I have reviewed the patient's current medications. Current Outpatient Prescriptions  Medication Sig Dispense Refill  . acetaminophen (TYLENOL) 500 MG tablet Take 500 mg by mouth daily as needed for pain.      [name] atorvastatin (LIPITOR) 10 MG tablet Take 1 tablet (10 mg total) by mouth daily.  30 tablet  11  . B Complex-C (B-COMPLEX WITH VITAMIN C) tablet Take 1 tablet by mouth daily.      . Cholecalciferol (VITAMIN D3) 1000 UNITS tablet Take 1,000 Units by  mouth daily.        . clobetasol cream (TEMOVATE) 0.05 % As directed      . diltiazem (CARDIZEM CD) 120 MG 24 hr capsule Take 120 mg by mouth daily.      . Ferrous Sulfate (IRON) 325 (65 FE) MG TABS Take 1 tablet by mouth daily.      . furosemide (LASIX) 40 MG tablet Take 1 tablet (40 mg total) by mouth daily as needed.  30 tablet  5  . hydrOXYzine (ATARAX) 25 MG tablet Take 25-50 mg by mouth 2 (two) times daily as needed. For itching       . levothyroxine (SYNTHROID, LEVOTHROID) 25 MCG tablet Take 25 mcg by mouth daily before breakfast.      . metoprolol succinate (TOPROL-XL) 50 MG 24 hr tablet Take 50 mg by mouth 2 (two) times daily. Take with or immediately following a meal.      . pantoprazole (PROTONIX) 40 MG tablet Take 40 mg by mouth daily.      . potassium chloride SA (K-DUR,KLOR-CON) 20 MEQ tablet Take 20 mEq by mouth daily.      . temazepam (RESTORIL) 30 MG capsule Take 30 mg by mouth at bedtime.      . traMADol (ULTRAM) 50 MG tablet Take 1-2 tablets (50-100 mg total) by mouth 2 (two) times daily as needed. For pain  30 tablet  5  . vitamin C (ASCORBIC ACID) 500 MG tablet Take 500 mg by mouth daily. ( TAKE WITH IRON)      . warfarin (COUMADIN) 5  MG tablet Take 2.5 mg by mouth daily. 5mg  on monday       No current facility-administered medications for this visit.    Allergies:  Allergies  Allergen Reactions  . Atenolol     REACTION: sick  . Clarithromycin     REACTION: nausea  . Codeine Sulfate     REACTION: nausea  . Macrodantin     Long time ago  . Percocet (Oxycodone-Acetaminophen)     REACTION: n/v    Past Medical History, Surgical history, Social history, and Family History were reviewed and updated.  Review of Systems: Constitutional:  Negative for fever, chills, night sweats, anorexia, weight loss, pain. Cardiovascular: no chest pain or dyspnea on exertion Respiratory: no cough, shortness of breath, or wheezing Neurological: no TIA or stroke  symptoms Dermatological: negative ENT: negative Skin: Negative. Gastrointestinal: no abdominal pain, change in bowel habits, or black or bloody stools Genito-Urinary: no dysuria, trouble voiding, or hematuria Hematological and Lymphatic: negative Breast: negative Musculoskeletal: negative Remaining ROS negative. Physical Exam: Blood pressure 133/77, pulse 74, temperature 97.6 F (36.4 C), temperature source Oral, resp. rate 18, height 5\' 3"  (1.6 m), weight 174 lb 9.6 oz (79.198 kg), SpO2 97.00%. ECOG: 1 General appearance: alert Head: Normocephalic, without obvious abnormality, atraumatic Neck: no adenopathy, no carotid bruit, no JVD, supple, symmetrical, trachea midline and thyroid not enlarged, symmetric, no tenderness/mass/nodules Lymph nodes: Cervical, supraclavicular, and axillary nodes normal. Heart:regular rate and rhythm, S1, S2 normal, no murmur, click, rub or gallop Lung:chest clear, no wheezing, rales, normal symmetric air entry Abdomin: soft, non-tender, without masses or organomegaly EXT:no erythema, induration, or nodules   Lab Results: Lab Results  Component Value Date   WBC 5.9 07/08/2012   HGB 13.7 07/08/2012   HCT 39.5 07/08/2012   MCV 104.8* 07/08/2012   PLT 313 07/08/2012     Chemistry      Component Value Date/Time   NA 134* 06/25/2012 1619   K 5.0 06/25/2012 1619   CL 98 06/25/2012 1619   CO2 24 06/25/2012 1619   BUN 13 06/25/2012 1619   CREATININE 1.0 06/25/2012 1619      Component Value Date/Time   CALCIUM 9.5 06/25/2012 1619   ALKPHOS 73 06/25/2012 1619   AST 27 06/25/2012 1619   ALT 31 06/25/2012 1619   BILITOT 1.4* 06/25/2012 1619      Impression and Plan:   This is a pleasant 86-year female with the following issues:   Iron deficiency anemia.  She did have an excellent response to IV iron in the past.  She currently remains on iron supplementation once  a day.  Her hemoglobin is within normal limits at 13.7.  She will remain on her iron supplementation once a  day and doing well with it. She has not needed any further IV iron supplementation for the last 3 years. Her iron stores remain intact and no further bleeding have been noted. My recommendation at this point is to continue oral supplements daily and only use IV iron in the future if she has more dramatic decline. I will be happy to see her in the future as needed as he is getting routine checkups with her primary care physician.  [name] Meriter, MD 6/19/20143:01 PM

## 2012-07-13 ENCOUNTER — Other Ambulatory Visit: Payer: Self-pay | Admitting: *Deleted

## 2012-07-13 ENCOUNTER — Other Ambulatory Visit: Payer: Self-pay | Admitting: Cardiovascular Disease

## 2012-07-21 ENCOUNTER — Ambulatory Visit (INDEPENDENT_AMBULATORY_CARE_PROVIDER_SITE_OTHER): Payer: Medicare Other | Admitting: Internal Medicine

## 2012-07-21 ENCOUNTER — Encounter: Payer: Self-pay | Admitting: Internal Medicine

## 2012-07-21 VITALS — BP 120/92 | HR 80 | Temp 97.5°F | Resp 16 | Ht 63.0 in | Wt 180.0 lb

## 2012-07-21 DIAGNOSIS — Z0000 Encounter for general adult medical examination without abnormal findings: Secondary | ICD-10-CM | POA: Diagnosis not present

## 2012-07-21 DIAGNOSIS — Z23 Encounter for immunization: Secondary | ICD-10-CM

## 2012-07-21 NOTE — Assessment & Plan Note (Addendum)
Here for medicare wellness/physical  Diet: heart healthy  Physical activity: sedentary  Depression/mood screen: negative  Hearing: intact to whispered voice  Visual acuity: grossly normal, performs annual eye exam  ADLs: capable  Fall risk: none  Home safety: good  Cognitive evaluation: intact to orientation, naming, recall and repetition  EOL planning: adv directives, full code/ I agree  I have personally reviewed and have noted  1. The patient's medical and social history  2. Their use of alcohol, tobacco or illicit drugs  3. Their current medications and supplements  4. The patient's functional ability including ADL's, fall risks, home safety risks and hearing or visual impairment.  5. Diet and physical activities  6. Evidence for depression or mood disorders    Today patient counseled on age appropriate routine health concerns for screening and prevention, each reviewed and up to date or declined. Immunizations reviewed and up to date or declined. Labs ordered and reviewed. Risk factors for depression reviewed and negative. Hearing function and visual acuity are intact. ADLs screened and addressed as needed. Functional ability and level of safety reviewed and appropriate. Education, counseling and referrals performed based on assessed risks today. Patient provided with a copy of personalized plan for preventive services.   DT Zostavax suggested

## 2012-07-21 NOTE — Progress Notes (Signed)
   Subjective:    HPI    The patient is here for a wellness exam. The patient has been doing well overall without major new physical or psychological issues going on lately. 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 LBP is bad w/activity  BP Readings from Last 3 Encounters:  07/21/12 120/92  07/08/12 133/77  06/25/12 130/82   Wt Readings from Last 3 Encounters:  07/21/12 180 lb (81.647 kg)  07/08/12 174 lb 9.6 oz (79.198 kg)  06/25/12 172 lb (78.019 kg)      Review of Systems  Constitutional: Positive for fatigue. Negative for chills, activity change, appetite change and unexpected weight change.  HENT: Negative for congestion, mouth sores and sinus pressure.   Eyes: Negative for visual disturbance.  Respiratory: Negative for cough and chest tightness.   Gastrointestinal: Negative for nausea and abdominal pain.  Genitourinary: Negative for frequency, difficulty urinating and vaginal pain.  Musculoskeletal: Positive for back pain. Negative for gait problem.  Skin: Negative for pallor and rash.  Neurological: Negative for dizziness, tremors, weakness, numbness and headaches.  Psychiatric/Behavioral: Negative for suicidal ideas, confusion and sleep disturbance. The patient is not nervous/anxious.        Objective:   Physical Exam  Constitutional: She appears well-developed and well-nourished. No distress.  HENT:  Head: Normocephalic.  Right Ear: External ear normal.  Left Ear: External ear normal.  Nose: Nose normal.  Mouth/Throat: Oropharynx is clear and moist.  Eyes: Conjunctivae are normal. Pupils are equal, round, and reactive to light. Right eye exhibits no discharge. Left eye exhibits no discharge.  Neck: Normal range of motion. Neck supple. No JVD present. No tracheal deviation present. No thyromegaly present.  Cardiovascular: Normal rate, regular rhythm  and normal heart sounds.   Pulmonary/Chest: No stridor. No respiratory distress. She has no wheezes.  Abdominal: Soft. Bowel sounds are normal. She exhibits no distension and no mass. There is no tenderness. There is no rebound and no guarding.  Musculoskeletal: She exhibits no edema and no tenderness.  cane  Lymphadenopathy:    She has no cervical adenopathy.  Neurological: She displays normal reflexes. No cranial nerve deficit. She exhibits normal muscle tone. Coordination normal.  Skin: No rash noted. No erythema.  Psychiatric: She has a normal mood and affect. Her behavior is normal. Judgment and thought content normal.      Lab Results  Component Value Date   WBC 5.9 07/08/2012   HGB 13.7 07/08/2012   HCT 39.5 07/08/2012   PLT 313 07/08/2012   GLUCOSE 99 06/25/2012   CHOL 116 06/25/2012   TRIG 41.0 06/25/2012   HDL 41.50 06/25/2012   LDLDIRECT 110.3 05/24/2008   LDLCALC 66 06/25/2012   ALT 31 06/25/2012   AST 27 06/25/2012   NA 134* 06/25/2012   K 5.0 06/25/2012   CL 98 06/25/2012   CREATININE 1.0 06/25/2012   BUN 13 06/25/2012   CO2 24 06/25/2012   TSH 0.44 06/25/2012   INR 2.1 07/06/2012   HGBA1C 5.7 05/13/2011       Assessment & Plan:

## 2012-08-03 ENCOUNTER — Ambulatory Visit (INDEPENDENT_AMBULATORY_CARE_PROVIDER_SITE_OTHER): Payer: Medicare Other | Admitting: *Deleted

## 2012-08-03 ENCOUNTER — Encounter: Payer: Medicare Other | Admitting: Internal Medicine

## 2012-08-03 DIAGNOSIS — Z7901 Long term (current) use of anticoagulants: Secondary | ICD-10-CM

## 2012-08-03 DIAGNOSIS — I4892 Unspecified atrial flutter: Secondary | ICD-10-CM

## 2012-08-03 DIAGNOSIS — I4891 Unspecified atrial fibrillation: Secondary | ICD-10-CM | POA: Diagnosis not present

## 2012-08-03 LAB — POCT INR: INR: 3.5

## 2012-08-20 ENCOUNTER — Other Ambulatory Visit: Payer: Self-pay | Admitting: Dermatology

## 2012-08-20 ENCOUNTER — Ambulatory Visit (INDEPENDENT_AMBULATORY_CARE_PROVIDER_SITE_OTHER): Payer: Medicare Other | Admitting: *Deleted

## 2012-08-20 DIAGNOSIS — L570 Actinic keratosis: Secondary | ICD-10-CM | POA: Diagnosis not present

## 2012-08-20 DIAGNOSIS — Z7901 Long term (current) use of anticoagulants: Secondary | ICD-10-CM | POA: Diagnosis not present

## 2012-08-20 DIAGNOSIS — D485 Neoplasm of uncertain behavior of skin: Secondary | ICD-10-CM | POA: Diagnosis not present

## 2012-08-20 DIAGNOSIS — I4892 Unspecified atrial flutter: Secondary | ICD-10-CM

## 2012-08-20 DIAGNOSIS — Z1231 Encounter for screening mammogram for malignant neoplasm of breast: Secondary | ICD-10-CM | POA: Diagnosis not present

## 2012-08-20 DIAGNOSIS — Z85828 Personal history of other malignant neoplasm of skin: Secondary | ICD-10-CM | POA: Diagnosis not present

## 2012-08-20 DIAGNOSIS — D1801 Hemangioma of skin and subcutaneous tissue: Secondary | ICD-10-CM | POA: Diagnosis not present

## 2012-08-20 DIAGNOSIS — L821 Other seborrheic keratosis: Secondary | ICD-10-CM | POA: Diagnosis not present

## 2012-08-20 DIAGNOSIS — I8310 Varicose veins of unspecified lower extremity with inflammation: Secondary | ICD-10-CM | POA: Diagnosis not present

## 2012-08-20 DIAGNOSIS — I4891 Unspecified atrial fibrillation: Secondary | ICD-10-CM | POA: Diagnosis not present

## 2012-08-20 DIAGNOSIS — C4359 Malignant melanoma of other part of trunk: Secondary | ICD-10-CM | POA: Diagnosis not present

## 2012-08-20 LAB — POCT INR: INR: 2.1

## 2012-09-02 ENCOUNTER — Encounter: Payer: Self-pay | Admitting: Internal Medicine

## 2012-09-06 ENCOUNTER — Other Ambulatory Visit: Payer: Self-pay | Admitting: Dermatology

## 2012-09-06 DIAGNOSIS — Z85828 Personal history of other malignant neoplasm of skin: Secondary | ICD-10-CM | POA: Diagnosis not present

## 2012-09-06 DIAGNOSIS — C4359 Malignant melanoma of other part of trunk: Secondary | ICD-10-CM | POA: Diagnosis not present

## 2012-09-14 ENCOUNTER — Ambulatory Visit (INDEPENDENT_AMBULATORY_CARE_PROVIDER_SITE_OTHER): Payer: Medicare Other | Admitting: Pharmacist

## 2012-09-14 ENCOUNTER — Other Ambulatory Visit: Payer: Self-pay | Admitting: Internal Medicine

## 2012-09-14 DIAGNOSIS — Z7901 Long term (current) use of anticoagulants: Secondary | ICD-10-CM | POA: Diagnosis not present

## 2012-09-14 DIAGNOSIS — I4892 Unspecified atrial flutter: Secondary | ICD-10-CM

## 2012-09-14 DIAGNOSIS — I4891 Unspecified atrial fibrillation: Secondary | ICD-10-CM | POA: Diagnosis not present

## 2012-09-14 LAB — POCT INR: INR: 1.7

## 2012-09-14 NOTE — Telephone Encounter (Signed)
Ok to refill? Last OV 7.2.14

## 2012-09-22 ENCOUNTER — Other Ambulatory Visit: Payer: Self-pay | Admitting: Cardiovascular Disease

## 2012-10-04 ENCOUNTER — Encounter: Payer: Self-pay | Admitting: Cardiovascular Disease

## 2012-10-04 ENCOUNTER — Ambulatory Visit (INDEPENDENT_AMBULATORY_CARE_PROVIDER_SITE_OTHER): Payer: Medicare Other | Admitting: Cardiovascular Disease

## 2012-10-04 ENCOUNTER — Ambulatory Visit (INDEPENDENT_AMBULATORY_CARE_PROVIDER_SITE_OTHER): Payer: Medicare Other

## 2012-10-04 VITALS — BP 145/90 | HR 88 | Resp 12 | Ht 63.0 in | Wt 179.0 lb

## 2012-10-04 DIAGNOSIS — I4892 Unspecified atrial flutter: Secondary | ICD-10-CM | POA: Diagnosis not present

## 2012-10-04 DIAGNOSIS — Z7901 Long term (current) use of anticoagulants: Secondary | ICD-10-CM | POA: Diagnosis not present

## 2012-10-04 DIAGNOSIS — I4891 Unspecified atrial fibrillation: Secondary | ICD-10-CM

## 2012-10-04 DIAGNOSIS — I10 Essential (primary) hypertension: Secondary | ICD-10-CM | POA: Diagnosis not present

## 2012-10-04 LAB — BASIC METABOLIC PANEL
BUN: 10 mg/dL (ref 6–23)
CO2: 27 mEq/L (ref 19–32)
Calcium: 9.3 mg/dL (ref 8.4–10.5)
Chloride: 102 mEq/L (ref 96–112)
Creatinine, Ser: 0.8 mg/dL (ref 0.4–1.2)
GFR: 73.53 mL/min (ref >60.00)
Glucose, Bld: 93 mg/dL (ref 70–99)
Potassium: 4.4 mEq/L (ref 3.5–5.1)
Sodium: 136 mEq/L (ref 135–145)

## 2012-10-04 LAB — CBC WITH DIFFERENTIAL/PLATELET
Basophils Absolute: 0.1 10*3/uL (ref 0.0–0.1)
Basophils Relative: 0.9 % (ref 0.0–3.0)
Eosinophils Absolute: 0.1 10*3/uL (ref 0.0–0.7)
Eosinophils Relative: 1.4 % (ref 0.0–5.0)
HCT: 43.5 % (ref 36.0–46.0)
Hemoglobin: 14.8 g/dL (ref 12.0–15.0)
Lymphocytes Relative: 17.9 % (ref 12.0–46.0)
Lymphs Abs: 1.1 10*3/uL (ref 0.7–4.0)
MCHC: 34 g/dL (ref 30.0–36.0)
MCV: 107.1 fl — ABNORMAL HIGH (ref 78.0–100.0)
Monocytes Absolute: 0.6 10*3/uL (ref 0.1–1.0)
Monocytes Relative: 10.3 % (ref 3.0–12.0)
Neutro Abs: 4.3 10*3/uL (ref 1.4–7.7)
Neutrophils Relative %: 69.5 % (ref 43.0–77.0)
Platelets: 249 10*3/uL (ref 150.0–400.0)
RBC: 4.06 Mil/uL (ref 3.87–5.11)
RDW: 13.6 % (ref 11.5–14.6)
WBC: 6.2 10*3/uL (ref 4.5–10.5)

## 2012-10-04 LAB — POCT INR: INR: 2.4

## 2012-10-04 MED ORDER — APIXABAN 5 MG PO TABS: 5.0000 mg | ORAL_TABLET | Freq: Two times a day (BID) | ORAL | Status: DC

## 2012-10-04 NOTE — Progress Notes (Signed)
History of Present Illness: 79 yo WF with history of HTN, chronic low back pain, iron deficiency anemia and paroxysmal atrial fibrillation/flutter who is here today for follow up. She has had chronic atrial flutter/fibrillation over the past 3 years and has been managed on coumadin and rate control agents. In 2011 she had dark stools with anemia. Coumadin was held and upper endoscopy showed several small clean based gastric ulcers. She was admitted October 2011 and found to be dehydrated with acute renal failure. She was hydrated and this resolved. No further evidence of GI bleeding. She had several episodes of RVR with her atrial fib in the hospital. Coumadin has been restarted without evidence of further GI bleeding.  She had her right hip replaced per Dr. Maureen Chatters in May 2012. She has had bilateral lower ext swelling since her hip replacement in May 2012. She was seen in primary care August 23, 2010. She was started on lasix 40 mg po BID. This seemed to help the swelling. Venous dopplers negative for DVT.   She is here today for follow up. She has not been aware of any tachycardia or skipped beats. No chest pain or SOB. Her back pain is stable. No bleeding issues.   Primary Care Physician: [name]  Last Lipid Profile:Lipid Panel     Component Value Date/Time   CHOL 116 06/25/2012 1619   TRIG 41.0 06/25/2012 1619   HDL 41.50 06/25/2012 1619   CHOLHDL 3 06/25/2012 1619   VLDL 8.2 06/25/2012 1619   LDLCALC 66 06/25/2012 1619     Past Medical History  Diagnosis Date  . Paroxysmal atrial fibrillation      on coumadin therapy  . Hypertension   . LBP (low back pain)     severe lumbar spondylosis s/p L3/L4,L4/L5 fusion 12/10  . OA (osteoarthritis)     Dr. Juliene Pina  . Osteopenia   . Elevated MCV   . Constipation     ischemic colitis with rectal bleeding   . B12 deficiency     boderline  . Renal insufficiency   . Hypothyroidism 2011  . NSAID-associated gastropathy 2011    with bleed  . Iron  deficiency anemia 2011    elevated MCV  . Peptic ulcer disease     Past Surgical History  Procedure Laterality Date  . Tonsillectomy    . Rotator cuff repair  1997    Left  . Back surgery  12/2008    L3/L4/L fusion  . Spine surgery      Current Outpatient Prescriptions  Medication Sig Dispense Refill  . acetaminophen (TYLENOL) 500 MG tablet Take 500 mg by mouth daily as needed for pain.      [name] atorvastatin (LIPITOR) 10 MG tablet Take 1 tablet (10 mg total) by mouth daily.  30 tablet  11  . B Complex-C (B-COMPLEX WITH VITAMIN C) tablet Take 1 tablet by mouth daily.      . Cholecalciferol (VITAMIN D3) 1000 UNITS tablet Take 1,000 Units by mouth daily.        . clobetasol cream (TEMOVATE) 0.05 % As directed      . diltiazem (CARDIZEM CD) 120 MG 24 hr capsule Take 120 mg by mouth daily.      . Ferrous Sulfate (IRON) 325 (65 FE) MG TABS Take 1 tablet by mouth daily.      . furosemide (LASIX) 40 MG tablet Take 1 tablet (40 mg total) by mouth daily as needed.  30 tablet  5  . hydrOXYzine (  ATARAX) 25 MG tablet Take 25-50 mg by mouth 2 (two) times daily as needed. For itching       . levothyroxine (SYNTHROID, LEVOTHROID) 25 MCG tablet Take 25 mcg by mouth daily before breakfast.      . metoprolol succinate (TOPROL-XL) 50 MG 24 hr tablet Take 50 mg by mouth 2 (two) times daily. Take with or immediately following a meal.      . pantoprazole (PROTONIX) 40 MG tablet Take 40 mg by mouth daily.      . potassium chloride SA (K-DUR,KLOR-CON) 20 MEQ tablet Take 20 mEq by mouth daily.      . temazepam (RESTORIL) 30 MG capsule TAKE ONE CAPSULE BY MOUTH AT BEDTIME AS NEEDED  30 capsule  5  . traMADol (ULTRAM) 50 MG tablet Take 1-2 tablets (50-100 mg total) by mouth 2 (two) times daily as needed. For pain  30 tablet  5  . vitamin C (ASCORBIC ACID) 500 MG tablet Take 500 mg by mouth daily. ( TAKE WITH IRON)      . warfarin (COUMADIN) 5 MG tablet USE AS DIRECTED BY ANTICOAGULATION CLINIC  30 tablet  3    No current facility-administered medications for this visit.    Allergies  Allergen Reactions  . Atenolol     REACTION: sick  . Clarithromycin     REACTION: nausea  . Codeine Sulfate     REACTION: nausea  . Macrodantin     Long time ago  . Percocet [Oxycodone-Acetaminophen]     REACTION: n/v    History   Social History  . Marital Status: Married    Spouse Name: N/A    Number of Children: 2  . Years of Education: N/A   Occupational History  . retired [employer]   Social History Main Topics  . Smoking status: Never Smoker   . Smokeless tobacco: Not on file  . Alcohol Use: 4.2 oz/week    7 Glasses of wine per week     Comment: 1 glass daily  . Drug Use: No  . Sexual Activity: Not Currently   Other Topics Concern  . Not on file   Social History Narrative  . No narrative on file    Family History  Problem Relation Age of Onset  . Cancer Father   . Hypertension Other     Review of Systems:  As stated in the HPI and otherwise negative.   BP 145/90  Pulse 88  Resp 12  Ht 5\' 3"  (1.6 m)  Wt 179 lb (81.194 kg)  BMI 31.72 kg/m2  Physical Examination: General: Well developed, well nourished, NAD HEENT: OP clear, mucus membranes moist SKIN: warm, dry. No rashes. Neuro: No focal deficits Musculoskeletal: Muscle strength 5/5 all ext Psychiatric: Mood and affect normal Neck: No JVD, no carotid bruits, no thyromegaly, no lymphadenopathy. Lungs:Clear bilaterally, no wheezes, rhonci, crackles Cardiovascular: Irregular  rate and rhythm. No murmurs, gallops or rubs. Abdomen:Soft. Bowel sounds present. Non-tender.  Extremities: No lower extremity edema. Pulses are 2 + in the bilateral DP/PT. Chronic venous stasis changes.   Assessment and Plan:   1. Atrial fibrillation: Chronic persistent, rate controlled. She is anti-coagulated with coumadin with no bleeding issues. Continue beta blocker and Cardizem. We have discussed changing to Eliquis. She wishes to  do this. Will ask Weston Brass, PharmD to help in transition from coumadin to Eliquis 5 mg po BID.   2. HTN: Elevated today. She will follow at home. No changes

## 2012-10-04 NOTE — Patient Instructions (Addendum)
Your physician wants you to follow-up in:  6 months. You will receive a reminder letter in the mail two months in advance. If you don't receive a letter, please call our office to schedule the follow-up appointment.  Your physician has recommended you make the following change in your medication:  Stop Coumadin  today (do not take today). Start Eliquis 5 mg by mouth twice daily starting tomorrow.   Keep scheduled appt with coumadin clinic on October 13,2014 to follow up on Eliquis

## 2012-10-07 ENCOUNTER — Other Ambulatory Visit: Payer: Self-pay | Admitting: Cardiovascular Disease

## 2012-10-13 ENCOUNTER — Other Ambulatory Visit: Payer: Self-pay | Admitting: Gastroenterology

## 2012-10-16 ENCOUNTER — Other Ambulatory Visit: Payer: Self-pay | Admitting: Gastroenterology

## 2012-11-01 ENCOUNTER — Ambulatory Visit (INDEPENDENT_AMBULATORY_CARE_PROVIDER_SITE_OTHER): Payer: Medicare Other | Admitting: *Deleted

## 2012-11-01 DIAGNOSIS — I4892 Unspecified atrial flutter: Secondary | ICD-10-CM | POA: Diagnosis not present

## 2012-11-01 DIAGNOSIS — Z7901 Long term (current) use of anticoagulants: Secondary | ICD-10-CM | POA: Diagnosis not present

## 2012-11-01 DIAGNOSIS — I4891 Unspecified atrial fibrillation: Secondary | ICD-10-CM | POA: Diagnosis not present

## 2012-11-01 LAB — CBC
HCT: 43.2 % (ref 36.0–46.0)
Hemoglobin: 15.2 g/dL — ABNORMAL HIGH (ref 12.0–15.0)
MCHC: 35.2 g/dL (ref 30.0–36.0)
MCV: 107.6 fl — ABNORMAL HIGH (ref 78.0–100.0)
Platelets: 246 10*3/uL (ref 150.0–400.0)
RBC: 4.01 Mil/uL (ref 3.87–5.11)
RDW: 13.6 % (ref 11.5–14.6)
WBC: 6.6 10*3/uL (ref 4.5–10.5)

## 2012-11-01 LAB — BASIC METABOLIC PANEL
BUN: 13 mg/dL (ref 6–23)
CO2: 27 mEq/L (ref 19–32)
Calcium: 9.6 mg/dL (ref 8.4–10.5)
Chloride: 97 mEq/L (ref 96–112)
Creatinine, Ser: 0.9 mg/dL (ref 0.4–1.2)
GFR: 60.9 mL/min (ref >60.00)
Glucose, Bld: 120 mg/dL — ABNORMAL HIGH (ref 70–99)
Potassium: 4.3 mEq/L (ref 3.5–5.1)
Sodium: 137 mEq/L (ref 135–145)

## 2012-11-01 NOTE — Progress Notes (Signed)
Pt was started on Eliquis 5mg  bid for paroxysmal atrial Fib/flutter   on  10/04/2012.    Reviewed patients medication list.  Pt is not  currently on any combined P-gp and strong CYP3A4 inhibitors/inducers (ketoconazole, traconazole, ritonavir, carbamazepine, phenytoin, rifampin, St. John's wort).  Reviewed labs.  SCr 0.9, Weight 82.45kg .  Dose is appropriate based on lab results.   Hgb 15.2 and HCT 43.2  A full discussion of the nature of anticoagulants has been carried out.  A benefit/risk analysis has been presented to the patient, so that they understand the justification for choosing anticoagulation with Eliquis at this time.  The need for compliance is stressed.  Pt is aware to take the medication twice daily.  Side effects of potential bleeding are discussed, including unusual colored urine or stools, coughing up blood or coffee ground emesis, nose bleeds or serious fall or head trauma.  Discussed signs and symptoms of stroke. The patient should avoid any OTC items containing aspirin or ibuprofen.  Avoid alcohol consumption.   Call if any signs of abnormal bleeding.  Discussed financial obligations and pt states talked with Pharmacy yesterday and she states that she would have to pay 475 dollars a month and she is trying to decide whether she will be able to stay on Eliquis  or change to Coumadin.  Next lab test test in 6 months.   Pt states she has had no problems with taking Eliquis but is concerned regarding price of Eliquis  Appt made for 6 months  11/03/2012 Talked with pt and gave lab findings and she is going to decide whether she is going to continue taking Eliquis or change back to coumadin and pt instructed to call Dr Clifton James and to let coumadin clinic know as well what she decides Will have our nurse doing prior approvals to check regarding her obtaining Eliquis and pt notified of this

## 2012-11-02 ENCOUNTER — Telehealth: Payer: Self-pay | Admitting: *Deleted

## 2012-11-02 NOTE — Telephone Encounter (Signed)
eliquis approved by [facility] medication plan, _____________ref number will be faxed

## 2012-11-03 ENCOUNTER — Telehealth: Payer: Self-pay | Admitting: Cardiovascular Disease

## 2012-11-03 NOTE — Telephone Encounter (Signed)
New Problem  Pt needs to discuss the Eliquis samples/// Pt states when she heard the price she'd rather not switch. Would rather stay on warfarin.

## 2012-11-03 NOTE — Telephone Encounter (Signed)
Spoke with pt. Approval has been given through her insurance for Eliquis but out of pocket cost will still be  $85 per month.  Due to cost she would like to switch back to Warfarin. She has enough samples of Eliquis to last through next week.  Will send to Dr. Clifton James to see if OK for pt to switch back to warfarin.

## 2012-11-04 NOTE — Telephone Encounter (Signed)
OK to switch back to coumadin. She can discuss at f/u in coumadin clinic. cdm

## 2012-11-04 NOTE — Telephone Encounter (Signed)
Reviewed with [name], PharmD and pt will need coumadin clinic appt to make change.  I spoke with pt and appt made for her in coumadin clinic on November 08, 2012 at 12:15.  Pt is aware to continue Eliquis until this appt and she will be given instructions at that time.

## 2012-11-05 ENCOUNTER — Telehealth: Payer: Self-pay | Admitting: *Deleted

## 2012-11-05 NOTE — Telephone Encounter (Signed)
Pt informed that did have approval but she still has a co pay so she has an appt to see [place] on Monday October  20 th as she states she wants to take coumadin instead

## 2012-11-05 NOTE — Telephone Encounter (Signed)
Message copied by Jeannine Kitten on Fri Nov 05, 2012 10:00 AM ------      Message from: Carmela Hurt      Created: Thu Nov 04, 2012 11:34 AM       Have gotten approved. Thanks, Addison Lank, RN                              ----- Message -----         From: Jeannine Kitten, RN         Sent: 11/03/2012  10:30 AM           To: Carmela Hurt, RN            Saw Mrs [facility] in clinic yesterday and she is on Eliquis 5mg  bid and had not checked regarding price of the Eliquis and when went to pharmacy yesterday Eliquis was denied and states that she would have to pay  475 dollars a month for Eliquis . Kennon Rounds states needs prior approval. Could you help with this ? Thanks so much      Jasmine December       ------

## 2012-11-08 ENCOUNTER — Ambulatory Visit (INDEPENDENT_AMBULATORY_CARE_PROVIDER_SITE_OTHER): Payer: Medicare Other | Admitting: General Practice

## 2012-11-08 DIAGNOSIS — Z7901 Long term (current) use of anticoagulants: Secondary | ICD-10-CM | POA: Diagnosis not present

## 2012-11-08 DIAGNOSIS — I4892 Unspecified atrial flutter: Secondary | ICD-10-CM | POA: Diagnosis not present

## 2012-11-08 DIAGNOSIS — I4891 Unspecified atrial fibrillation: Secondary | ICD-10-CM | POA: Diagnosis not present

## 2012-11-08 LAB — POCT INR: INR: 1.2

## 2012-11-12 ENCOUNTER — Other Ambulatory Visit: Payer: Self-pay | Admitting: Internal Medicine

## 2012-11-16 ENCOUNTER — Ambulatory Visit (INDEPENDENT_AMBULATORY_CARE_PROVIDER_SITE_OTHER): Payer: Medicare Other | Admitting: *Deleted

## 2012-11-16 DIAGNOSIS — Z7901 Long term (current) use of anticoagulants: Secondary | ICD-10-CM | POA: Diagnosis not present

## 2012-11-16 DIAGNOSIS — I4892 Unspecified atrial flutter: Secondary | ICD-10-CM | POA: Diagnosis not present

## 2012-11-16 DIAGNOSIS — I4891 Unspecified atrial fibrillation: Secondary | ICD-10-CM

## 2012-11-16 LAB — POCT INR: INR: 2.6

## 2012-11-22 ENCOUNTER — Other Ambulatory Visit: Payer: Self-pay | Admitting: Cardiovascular Disease

## 2012-11-23 ENCOUNTER — Ambulatory Visit (INDEPENDENT_AMBULATORY_CARE_PROVIDER_SITE_OTHER): Payer: Medicare Other | Admitting: Internal Medicine

## 2012-11-23 ENCOUNTER — Other Ambulatory Visit (INDEPENDENT_AMBULATORY_CARE_PROVIDER_SITE_OTHER): Payer: Medicare Other

## 2012-11-23 ENCOUNTER — Encounter: Payer: Self-pay | Admitting: Internal Medicine

## 2012-11-23 VITALS — BP 130/80 | HR 76 | Temp 98.2°F | Resp 16

## 2012-11-23 DIAGNOSIS — E039 Hypothyroidism, unspecified: Secondary | ICD-10-CM

## 2012-11-23 DIAGNOSIS — M545 Low back pain, unspecified: Secondary | ICD-10-CM

## 2012-11-23 DIAGNOSIS — I10 Essential (primary) hypertension: Secondary | ICD-10-CM

## 2012-11-23 LAB — BASIC METABOLIC PANEL
BUN: 11 mg/dL (ref 6–23)
CO2: 29 mEq/L (ref 19–32)
Calcium: 9.1 mg/dL (ref 8.4–10.5)
Chloride: 100 mEq/L (ref 96–112)
Creatinine, Ser: 0.9 mg/dL (ref 0.4–1.2)
GFR: 67.55 mL/min (ref >60.00)
Glucose, Bld: 112 mg/dL — ABNORMAL HIGH (ref 70–99)
Potassium: 3.5 mEq/L (ref 3.5–5.1)
Sodium: 135 mEq/L (ref 135–145)

## 2012-11-23 LAB — TSH: TSH: 0.65 u[IU]/mL (ref 0.35–5.50)

## 2012-11-23 MED ORDER — DULOXETINE HCL 30 MG PO CPEP: 30.0000 mg | ORAL_CAPSULE | Freq: Every day | ORAL | Status: DC

## 2012-11-23 NOTE — Progress Notes (Signed)
   Subjective:    HPI  F/u LBP The patient is here to follow up on chronic LBP - a little better, situational depression due to LBP - better, anxiety, HTN controlled with medicines. LBP is bad w/activity  BP Readings from Last 3 Encounters:  11/23/12 130/80  10/04/12 145/90  07/21/12 120/92   Wt Readings from Last 3 Encounters:  10/04/12 179 lb (81.194 kg)  07/21/12 180 lb (81.647 kg)  07/08/12 174 lb 9.6 oz (79.198 kg)      Review of Systems  Constitutional: Positive for fatigue. Negative for chills, activity change, appetite change and unexpected weight change.  HENT: Negative for congestion, mouth sores and sinus pressure.   Eyes: Negative for visual disturbance.  Respiratory: Negative for cough and chest tightness.   Gastrointestinal: Negative for nausea and abdominal pain.  Genitourinary: Negative for frequency, difficulty urinating and vaginal pain.  Musculoskeletal: Positive for back pain. Negative for gait problem.  Skin: Negative for pallor and rash.  Neurological: Negative for dizziness, tremors, weakness, numbness and headaches.  Psychiatric/Behavioral: Negative for suicidal ideas, confusion and sleep disturbance. The patient is not nervous/anxious.        Objective:   Physical Exam  Constitutional: She appears well-developed and well-nourished. No distress.  HENT:  Head: Normocephalic.  Right Ear: External ear normal.  Left Ear: External ear normal.  Nose: Nose normal.  Mouth/Throat: Oropharynx is clear and moist.  Eyes: Conjunctivae are normal. Pupils are equal, round, and reactive to light. Right eye exhibits no discharge. Left eye exhibits no discharge.  Neck: Normal range of motion. Neck supple. No JVD present. No tracheal deviation present. No thyromegaly present.  Cardiovascular: Normal rate, regular rhythm and normal heart sounds.   Pulmonary/Chest: No stridor. No respiratory distress. She has no wheezes.  Abdominal: Soft. Bowel sounds are normal.  She exhibits no distension and no mass. There is no tenderness. There is no rebound and no guarding.  Musculoskeletal: She exhibits no edema and no tenderness.  cane  Lymphadenopathy:    She has no cervical adenopathy.  Neurological: She displays normal reflexes. No cranial nerve deficit. She exhibits normal muscle tone. Coordination normal.  Skin: No rash noted. No erythema.  Psychiatric: She has a normal mood and affect. Her behavior is normal. Judgment and thought content normal.      Lab Results  Component Value Date   WBC 6.6 11/01/2012   HGB 15.2* 11/01/2012   HCT 43.2 11/01/2012   PLT 246.0 11/01/2012   GLUCOSE 120* 11/01/2012   CHOL 116 06/25/2012   TRIG 41.0 06/25/2012   HDL 41.50 06/25/2012   LDLDIRECT 110.3 05/24/2008   LDLCALC 66 06/25/2012   ALT 31 06/25/2012   AST 27 06/25/2012   NA 137 11/01/2012   K 4.3 11/01/2012   CL 97 11/01/2012   CREATININE 0.9 11/01/2012   BUN 13 11/01/2012   CO2 27 11/01/2012   TSH 0.44 06/25/2012   INR 2.6 11/16/2012   HGBA1C 5.7 05/13/2011       Assessment & Plan:

## 2012-11-23 NOTE — Assessment & Plan Note (Signed)
Continue with current prescription therapy as reflected on the Med list.  

## 2012-11-23 NOTE — Progress Notes (Signed)
Pre-visit discussion using our clinic review tool. No additional management support is needed unless otherwise documented below in the visit note.  

## 2012-11-23 NOTE — Assessment & Plan Note (Signed)
Continue with current prn prescription therapy as reflected on the Med list. Cymbalta, Gabapentin offered

## 2012-11-30 ENCOUNTER — Ambulatory Visit (INDEPENDENT_AMBULATORY_CARE_PROVIDER_SITE_OTHER): Payer: Medicare Other | Admitting: *Deleted

## 2012-11-30 DIAGNOSIS — I4891 Unspecified atrial fibrillation: Secondary | ICD-10-CM | POA: Diagnosis not present

## 2012-11-30 DIAGNOSIS — I4892 Unspecified atrial flutter: Secondary | ICD-10-CM | POA: Diagnosis not present

## 2012-11-30 DIAGNOSIS — Z7901 Long term (current) use of anticoagulants: Secondary | ICD-10-CM | POA: Diagnosis not present

## 2012-11-30 LAB — POCT INR: INR: 2.4

## 2012-12-13 ENCOUNTER — Other Ambulatory Visit: Payer: Self-pay | Admitting: Gastroenterology

## 2012-12-17 ENCOUNTER — Other Ambulatory Visit: Payer: Self-pay | Admitting: Gastroenterology

## 2012-12-21 ENCOUNTER — Ambulatory Visit (INDEPENDENT_AMBULATORY_CARE_PROVIDER_SITE_OTHER): Payer: Medicare Other | Admitting: *Deleted

## 2012-12-21 ENCOUNTER — Other Ambulatory Visit: Payer: Self-pay | Admitting: Gastroenterology

## 2012-12-21 ENCOUNTER — Telehealth: Payer: Self-pay | Admitting: Gastroenterology

## 2012-12-21 DIAGNOSIS — I4891 Unspecified atrial fibrillation: Secondary | ICD-10-CM | POA: Diagnosis not present

## 2012-12-21 DIAGNOSIS — Z7901 Long term (current) use of anticoagulants: Secondary | ICD-10-CM

## 2012-12-21 DIAGNOSIS — I4892 Unspecified atrial flutter: Secondary | ICD-10-CM

## 2012-12-21 LAB — POCT INR: INR: 2.7

## 2012-12-21 MED ORDER — PANTOPRAZOLE SODIUM 40 MG PO TBEC: DELAYED_RELEASE_TABLET | ORAL | Status: DC

## 2012-12-21 NOTE — Telephone Encounter (Signed)
Pt was scheduled for an office visit and 1 month of pantoprazole has been sent

## 2012-12-22 ENCOUNTER — Other Ambulatory Visit: Payer: Self-pay | Admitting: *Deleted

## 2012-12-22 MED ORDER — POTASSIUM CHLORIDE CRYS ER 20 MEQ PO TBCR: 20.0000 meq | EXTENDED_RELEASE_TABLET | Freq: Every day | ORAL | Status: DC

## 2012-12-22 MED ORDER — WARFARIN SODIUM 5 MG PO TABS: 5.0000 mg | ORAL_TABLET | ORAL | Status: DC

## 2012-12-23 ENCOUNTER — Other Ambulatory Visit: Payer: Self-pay

## 2012-12-23 MED ORDER — METOPROLOL SUCCINATE ER 50 MG PO TB24: 50.0000 mg | ORAL_TABLET | Freq: Two times a day (BID) | ORAL | Status: DC

## 2012-12-23 MED ORDER — DILTIAZEM HCL ER COATED BEADS 120 MG PO CP24: 120.0000 mg | ORAL_CAPSULE | Freq: Every day | ORAL | Status: DC

## 2012-12-27 ENCOUNTER — Other Ambulatory Visit: Payer: Self-pay

## 2012-12-27 ENCOUNTER — Other Ambulatory Visit: Payer: Self-pay | Admitting: *Deleted

## 2012-12-27 MED ORDER — ATORVASTATIN CALCIUM 10 MG PO TABS: ORAL_TABLET | ORAL | Status: DC

## 2013-01-19 ENCOUNTER — Ambulatory Visit (INDEPENDENT_AMBULATORY_CARE_PROVIDER_SITE_OTHER): Payer: Medicare Other | Admitting: *Deleted

## 2013-01-19 ENCOUNTER — Encounter: Payer: Self-pay | Admitting: Gastroenterology

## 2013-01-19 ENCOUNTER — Ambulatory Visit (INDEPENDENT_AMBULATORY_CARE_PROVIDER_SITE_OTHER): Payer: Medicare Other | Admitting: Gastroenterology

## 2013-01-19 VITALS — BP 148/90 | HR 60 | Ht 63.0 in | Wt 182.6 lb

## 2013-01-19 DIAGNOSIS — Z7901 Long term (current) use of anticoagulants: Secondary | ICD-10-CM

## 2013-01-19 DIAGNOSIS — I4892 Unspecified atrial flutter: Secondary | ICD-10-CM | POA: Diagnosis not present

## 2013-01-19 DIAGNOSIS — K259 Gastric ulcer, unspecified as acute or chronic, without hemorrhage or perforation: Secondary | ICD-10-CM

## 2013-01-19 DIAGNOSIS — I4891 Unspecified atrial fibrillation: Secondary | ICD-10-CM

## 2013-01-19 DIAGNOSIS — K219 Gastro-esophageal reflux disease without esophagitis: Secondary | ICD-10-CM

## 2013-01-19 LAB — POCT INR: INR: 1.9

## 2013-01-19 MED ORDER — PANTOPRAZOLE SODIUM 40 MG PO TBEC: 40.0000 mg | DELAYED_RELEASE_TABLET | Freq: Every day | ORAL | Status: DC

## 2013-01-19 NOTE — Progress Notes (Signed)
Review of pertinent gastrointestinal problems: 1. H. pylori + gastric ulcers, presented with melena and anemia 10/2009; follow up EGD 03/2010 Christella Hartigan showed healed stomach, + HH.     HPI: This is a  very pleasant 79 year old woman whom I last saw 2 or 3 years ago.  I last saw her 2-3 years ago.  No bleeding overtly.  Has been on coumadin for 3 years.  Take protonix 1 pill once daily for h/o gastric ulcers.   Past Medical History  Diagnosis Date  . Paroxysmal atrial fibrillation      on coumadin therapy  . Hypertension   . LBP (low back pain)     severe lumbar spondylosis s/p L3/L4,L4/L5 fusion 12/10  . OA (osteoarthritis)     Dr. Juliene Pina  . Osteopenia   . Elevated MCV   . Constipation     ischemic colitis with rectal bleeding   . B12 deficiency     boderline  . Renal insufficiency   . Hypothyroidism 2011  . NSAID-associated gastropathy 2011    with bleed  . Iron deficiency anemia 2011    elevated MCV  . Peptic ulcer disease     Past Surgical History  Procedure Laterality Date  . Tonsillectomy    . Rotator cuff repair  1997    Left  . Back surgery  12/2008    L3/L4/L fusion  . Spine surgery      Current Outpatient Prescriptions  Medication Sig Dispense Refill  . acetaminophen (TYLENOL) 500 MG tablet Take 500 mg by mouth daily as needed for pain.      [name] atorvastatin (LIPITOR) 10 MG tablet TAKE 1 TABLET (10 MG TOTAL) BY MOUTH DAILY.  90 tablet  1  . B Complex-C (B-COMPLEX WITH VITAMIN C) tablet Take 1 tablet by mouth daily.      . Cholecalciferol (VITAMIN D3) 1000 UNITS tablet Take 1,000 Units by mouth daily.        . clobetasol cream (TEMOVATE) 0.05 % As directed      . diltiazem (CARDIZEM CD) 120 MG 24 hr capsule Take 1 capsule (120 mg total) by mouth daily.  90 capsule  1  . Ferrous Sulfate (IRON) 325 (65 FE) MG TABS Take 1 tablet by mouth daily.      . furosemide (LASIX) 40 MG tablet Take 1 tablet (40 mg total) by mouth daily as needed.  30 tablet  5  .  hydrOXYzine (ATARAX) 25 MG tablet Take 25-50 mg by mouth 2 (two) times daily as needed. For itching       . levothyroxine (SYNTHROID, LEVOTHROID) 25 MCG tablet Take 25 mcg by mouth daily before breakfast.      . metoprolol succinate (TOPROL-XL) 50 MG 24 hr tablet Take 1 tablet (50 mg total) by mouth 2 (two) times daily. Take with or immediately following a meal.  180 tablet  1  . pantoprazole (PROTONIX) 40 MG tablet TAKE 1 TABLET BY MOUTH EVERY DAY  30 tablet  0  . potassium chloride SA (K-DUR,KLOR-CON) 20 MEQ tablet Take 1 tablet (20 mEq total) by mouth daily.  90 tablet  1  . temazepam (RESTORIL) 30 MG capsule TAKE ONE CAPSULE BY MOUTH AT BEDTIME AS NEEDED  30 capsule  5  . traMADol (ULTRAM) 50 MG tablet Take 1-2 tablets (50-100 mg total) by mouth 2 (two) times daily as needed. For pain  30 tablet  5  . vitamin C (ASCORBIC ACID) 500 MG tablet Take 500 mg by mouth daily. (  TAKE WITH IRON)      . warfarin (COUMADIN) 5 MG tablet Take 1 tablet (5 mg total) by mouth as directed.  30 tablet  3   No current facility-administered medications for this visit.    Allergies as of 01/19/2013 - Review Complete 01/19/2013  Allergen Reaction Noted  . Atenolol  11/22/2009  . Clarithromycin  11/22/2009  . Codeine sulfate    . Macrodantin  04/26/2010  . Percocet [oxycodone-acetaminophen]  09/04/2008    Family History  Problem Relation Age of Onset  . Cancer Father   . Hypertension Other     History   Social History  . Marital Status: Married    Spouse Name: N/A    Number of Children: 2  . Years of Education: N/A   Occupational History  . retired Unemployed   Social History Main Topics  . Smoking status: Never Smoker   . Smokeless tobacco: Not on file  . Alcohol Use: 4.2 oz/week    7 Glasses of wine per week     Comment: 1 glass daily  . Drug Use: No  . Sexual Activity: Not Currently   Other Topics Concern  . Not on file   Social History Narrative  . No narrative on file       Physical Exam: BP 148/90  Pulse 60  Ht 5\' 3"  (1.6 m)  Wt 182 lb 9.6 oz (82.827 kg)  BMI 32.35 kg/m2 Constitutional: generally well-appearing Psychiatric: alert and oriented x3 Abdomen: soft, nontender, nondistended, no obvious ascites, no peritoneal signs, normal bowel sounds     Assessment and plan: 79 y.o. female with history of bleeding gastric ulcer 3 years ago, GERD  I have written her refill prescription for pantoprazole which she will continue take 1 pill once daily. I will refill this at one year if needed. I would like to have her see me again in 2 years if she still requires this prescription medicine.

## 2013-01-19 NOTE — Patient Instructions (Signed)
New script was called in today for pantoprazole 1 pill once daily. This will be refilled next year if needed. Would like office visit with Dr.Jacobs in 2 years for further refills if needed.

## 2013-02-16 ENCOUNTER — Ambulatory Visit (INDEPENDENT_AMBULATORY_CARE_PROVIDER_SITE_OTHER): Payer: Medicare Other | Admitting: *Deleted

## 2013-02-16 DIAGNOSIS — I4892 Unspecified atrial flutter: Secondary | ICD-10-CM | POA: Diagnosis not present

## 2013-02-16 DIAGNOSIS — Z7189 Other specified counseling: Secondary | ICD-10-CM | POA: Insufficient documentation

## 2013-02-16 DIAGNOSIS — I4891 Unspecified atrial fibrillation: Secondary | ICD-10-CM

## 2013-02-16 DIAGNOSIS — Z7901 Long term (current) use of anticoagulants: Secondary | ICD-10-CM

## 2013-02-16 DIAGNOSIS — Z5181 Encounter for therapeutic drug level monitoring: Secondary | ICD-10-CM

## 2013-02-16 LAB — POCT INR: INR: 2.4

## 2013-02-22 ENCOUNTER — Other Ambulatory Visit: Payer: Self-pay | Admitting: Dermatology

## 2013-02-22 DIAGNOSIS — L821 Other seborrheic keratosis: Secondary | ICD-10-CM | POA: Diagnosis not present

## 2013-02-22 DIAGNOSIS — L570 Actinic keratosis: Secondary | ICD-10-CM | POA: Diagnosis not present

## 2013-02-22 DIAGNOSIS — D485 Neoplasm of uncertain behavior of skin: Secondary | ICD-10-CM | POA: Diagnosis not present

## 2013-02-22 DIAGNOSIS — D1801 Hemangioma of skin and subcutaneous tissue: Secondary | ICD-10-CM | POA: Diagnosis not present

## 2013-02-22 DIAGNOSIS — L819 Disorder of pigmentation, unspecified: Secondary | ICD-10-CM | POA: Diagnosis not present

## 2013-02-22 DIAGNOSIS — Z85828 Personal history of other malignant neoplasm of skin: Secondary | ICD-10-CM | POA: Diagnosis not present

## 2013-02-22 DIAGNOSIS — L910 Hypertrophic scar: Secondary | ICD-10-CM | POA: Diagnosis not present

## 2013-02-23 ENCOUNTER — Ambulatory Visit (INDEPENDENT_AMBULATORY_CARE_PROVIDER_SITE_OTHER): Payer: Medicare Other | Admitting: Internal Medicine

## 2013-02-23 ENCOUNTER — Encounter: Payer: Self-pay | Admitting: Internal Medicine

## 2013-02-23 VITALS — BP 152/90 | HR 80 | Temp 98.4°F | Resp 16 | Wt 187.0 lb

## 2013-02-23 DIAGNOSIS — Z23 Encounter for immunization: Secondary | ICD-10-CM | POA: Diagnosis not present

## 2013-02-23 NOTE — Progress Notes (Signed)
Pre visit review using our clinic review tool, if applicable. No additional management support is needed unless otherwise documented below in the visit note. 

## 2013-02-23 NOTE — Progress Notes (Signed)
   Subjective:     HPI  F/u LBP  The patient is here to follow up on chronic LBP - a little better, situational depression due to LBP - better, anxiety, HTN controlled with medicines. LBP is bad w/activity  BP Readings from Last 3 Encounters:  02/23/13 152/90  01/19/13 148/90  11/23/12 130/80   Wt Readings from Last 3 Encounters:  02/23/13 187 lb (84.823 kg)  01/19/13 182 lb 9.6 oz (82.827 kg)  10/04/12 179 lb (81.194 kg)      Review of Systems  Constitutional: Positive for fatigue. Negative for chills, activity change, appetite change and unexpected weight change.  HENT: Negative for congestion, mouth sores and sinus pressure.   Eyes: Negative for visual disturbance.  Respiratory: Negative for cough and chest tightness.   Gastrointestinal: Negative for nausea and abdominal pain.  Genitourinary: Negative for frequency, difficulty urinating and vaginal pain.  Musculoskeletal: Positive for back pain. Negative for gait problem.  Skin: Negative for pallor and rash.  Neurological: Negative for dizziness, tremors, weakness, numbness and headaches.  Psychiatric/Behavioral: Negative for suicidal ideas, confusion and sleep disturbance. The patient is not nervous/anxious.        Objective:   Physical Exam  Constitutional: She appears well-developed and well-nourished. No distress.  HENT:  Head: Normocephalic.  Right Ear: External ear normal.  Left Ear: External ear normal.  Nose: Nose normal.  Mouth/Throat: Oropharynx is clear and moist.  Eyes: Conjunctivae are normal. Pupils are equal, round, and reactive to light. Right eye exhibits no discharge. Left eye exhibits no discharge.  Neck: Normal range of motion. Neck supple. No JVD present. No tracheal deviation present. No thyromegaly present.  Cardiovascular: Normal rate, regular rhythm and normal heart sounds.   Pulmonary/Chest: No stridor. No respiratory distress. She has no wheezes.  Abdominal: Soft. Bowel sounds are  normal. She exhibits no distension and no mass. There is no tenderness. There is no rebound and no guarding.  Musculoskeletal: She exhibits no edema and no tenderness.  cane  Lymphadenopathy:    She has no cervical adenopathy.  Neurological: She displays normal reflexes. No cranial nerve deficit. She exhibits normal muscle tone. Coordination normal.  Skin: No rash noted. No erythema.  Psychiatric: She has a normal mood and affect. Her behavior is normal. Judgment and thought content normal.      Lab Results  Component Value Date   WBC 6.6 11/01/2012   HGB 15.2* 11/01/2012   HCT 43.2 11/01/2012   PLT 246.0 11/01/2012   GLUCOSE 112* 11/23/2012   CHOL 116 06/25/2012   TRIG 41.0 06/25/2012   HDL 41.50 06/25/2012   LDLDIRECT 110.3 05/24/2008   LDLCALC 66 06/25/2012   ALT 31 06/25/2012   AST 27 06/25/2012   NA 135 11/23/2012   K 3.5 11/23/2012   CL 100 11/23/2012   CREATININE 0.9 11/23/2012   BUN 11 11/23/2012   CO2 29 11/23/2012   TSH 0.65 11/23/2012   INR 2.4 02/16/2013   HGBA1C 5.7 05/13/2011       Assessment & Plan:

## 2013-02-24 ENCOUNTER — Encounter: Payer: Self-pay | Admitting: Internal Medicine

## 2013-03-13 ENCOUNTER — Other Ambulatory Visit: Payer: Self-pay | Admitting: Internal Medicine

## 2013-03-16 NOTE — Telephone Encounter (Signed)
Pt called to check up on med request. Pt also request for Plot's assistant to give her a call. Please advise.

## 2013-03-18 ENCOUNTER — Telehealth: Payer: Self-pay | Admitting: Internal Medicine

## 2013-03-18 ENCOUNTER — Other Ambulatory Visit: Payer: Self-pay | Admitting: Internal Medicine

## 2013-03-18 NOTE — Telephone Encounter (Signed)
Patient also phoned triage line at [phone].  Last PCP OV 02/23/13 and med last ordered this morning.  Will contact patient to advise.  Phoned and notified patient.

## 2013-03-18 NOTE — Telephone Encounter (Signed)
Pt called stated that drug store request for new rx to resubmit into [facility] for temazepam. Please advise.

## 2013-03-24 ENCOUNTER — Telehealth: Payer: Self-pay | Admitting: *Deleted

## 2013-03-24 ENCOUNTER — Ambulatory Visit (INDEPENDENT_AMBULATORY_CARE_PROVIDER_SITE_OTHER): Payer: Medicare Other

## 2013-03-24 DIAGNOSIS — I4891 Unspecified atrial fibrillation: Secondary | ICD-10-CM | POA: Diagnosis not present

## 2013-03-24 DIAGNOSIS — I4892 Unspecified atrial flutter: Secondary | ICD-10-CM | POA: Diagnosis not present

## 2013-03-24 DIAGNOSIS — Z5181 Encounter for therapeutic drug level monitoring: Secondary | ICD-10-CM

## 2013-03-24 DIAGNOSIS — Z7901 Long term (current) use of anticoagulants: Secondary | ICD-10-CM | POA: Diagnosis not present

## 2013-03-24 DIAGNOSIS — Z9181 History of falling: Secondary | ICD-10-CM

## 2013-03-24 LAB — POCT INR: INR: 2.9

## 2013-03-24 NOTE — Telephone Encounter (Signed)
Patient has phoned [place], as well as spouse visited, and patient is requesting order for PT eval (outpatient) for fall prevention.

## 2013-03-24 NOTE — Telephone Encounter (Signed)
Ok Thx 

## 2013-03-25 ENCOUNTER — Encounter: Payer: Self-pay | Admitting: Internal Medicine

## 2013-03-25 NOTE — Telephone Encounter (Signed)
Put PT order in and notified patient.  Patient verbalized understanding and much appreciation.

## 2013-03-27 ENCOUNTER — Other Ambulatory Visit: Payer: Self-pay | Admitting: Internal Medicine

## 2013-03-27 DIAGNOSIS — R270 Ataxia, unspecified: Secondary | ICD-10-CM

## 2013-03-27 DIAGNOSIS — M545 Low back pain, unspecified: Secondary | ICD-10-CM

## 2013-03-30 ENCOUNTER — Ambulatory Visit: Payer: Medicare Other | Attending: Internal Medicine

## 2013-03-30 DIAGNOSIS — R279 Unspecified lack of coordination: Secondary | ICD-10-CM | POA: Insufficient documentation

## 2013-03-30 DIAGNOSIS — IMO0001 Reserved for inherently not codable concepts without codable children: Secondary | ICD-10-CM | POA: Diagnosis not present

## 2013-03-30 DIAGNOSIS — R262 Difficulty in walking, not elsewhere classified: Secondary | ICD-10-CM | POA: Diagnosis not present

## 2013-04-01 ENCOUNTER — Ambulatory Visit: Payer: Medicare Other

## 2013-04-05 ENCOUNTER — Ambulatory Visit: Payer: Medicare Other

## 2013-04-08 ENCOUNTER — Ambulatory Visit: Payer: Medicare Other | Admitting: Physical Therapy

## 2013-04-12 ENCOUNTER — Ambulatory Visit: Payer: Medicare Other | Admitting: Physical Therapy

## 2013-04-15 ENCOUNTER — Ambulatory Visit: Payer: Medicare Other | Admitting: Physical Therapy

## 2013-04-19 ENCOUNTER — Ambulatory Visit: Payer: Medicare Other | Admitting: Physical Therapy

## 2013-04-21 ENCOUNTER — Ambulatory Visit: Payer: Medicare Other | Attending: Internal Medicine

## 2013-04-21 DIAGNOSIS — IMO0001 Reserved for inherently not codable concepts without codable children: Secondary | ICD-10-CM | POA: Diagnosis not present

## 2013-04-21 DIAGNOSIS — R262 Difficulty in walking, not elsewhere classified: Secondary | ICD-10-CM | POA: Diagnosis not present

## 2013-04-21 DIAGNOSIS — R279 Unspecified lack of coordination: Secondary | ICD-10-CM | POA: Diagnosis not present

## 2013-04-26 ENCOUNTER — Ambulatory Visit: Payer: Medicare Other | Admitting: Physical Therapy

## 2013-04-28 ENCOUNTER — Ambulatory Visit (INDEPENDENT_AMBULATORY_CARE_PROVIDER_SITE_OTHER): Payer: Medicare Other | Admitting: Pharmacist

## 2013-04-28 DIAGNOSIS — Z5181 Encounter for therapeutic drug level monitoring: Secondary | ICD-10-CM | POA: Diagnosis not present

## 2013-04-28 DIAGNOSIS — Z7901 Long term (current) use of anticoagulants: Secondary | ICD-10-CM

## 2013-04-28 DIAGNOSIS — I4891 Unspecified atrial fibrillation: Secondary | ICD-10-CM | POA: Diagnosis not present

## 2013-04-28 DIAGNOSIS — I4892 Unspecified atrial flutter: Secondary | ICD-10-CM | POA: Diagnosis not present

## 2013-04-28 LAB — POCT INR: INR: 3

## 2013-04-29 ENCOUNTER — Ambulatory Visit: Payer: Medicare Other | Admitting: Physical Therapy

## 2013-05-03 ENCOUNTER — Ambulatory Visit: Payer: Medicare Other | Admitting: Physical Therapy

## 2013-05-05 ENCOUNTER — Ambulatory Visit: Payer: Medicare Other

## 2013-05-10 ENCOUNTER — Ambulatory Visit: Payer: Medicare Other | Admitting: Physical Therapy

## 2013-05-12 ENCOUNTER — Ambulatory Visit: Payer: Medicare Other | Admitting: Physical Therapy

## 2013-05-17 ENCOUNTER — Ambulatory Visit: Payer: Medicare Other

## 2013-05-18 DIAGNOSIS — Z96649 Presence of unspecified artificial hip joint: Secondary | ICD-10-CM | POA: Diagnosis not present

## 2013-05-19 ENCOUNTER — Ambulatory Visit: Payer: Medicare Other | Admitting: Physical Therapy

## 2013-05-24 ENCOUNTER — Ambulatory Visit: Payer: Medicare Other | Attending: Internal Medicine | Admitting: Physical Therapy

## 2013-05-24 DIAGNOSIS — IMO0001 Reserved for inherently not codable concepts without codable children: Secondary | ICD-10-CM | POA: Diagnosis not present

## 2013-05-24 DIAGNOSIS — R279 Unspecified lack of coordination: Secondary | ICD-10-CM | POA: Diagnosis not present

## 2013-05-24 DIAGNOSIS — R262 Difficulty in walking, not elsewhere classified: Secondary | ICD-10-CM | POA: Diagnosis not present

## 2013-05-26 ENCOUNTER — Ambulatory Visit: Payer: Medicare Other | Admitting: Physical Therapy

## 2013-06-09 ENCOUNTER — Ambulatory Visit (INDEPENDENT_AMBULATORY_CARE_PROVIDER_SITE_OTHER): Payer: Medicare Other

## 2013-06-09 DIAGNOSIS — Z5181 Encounter for therapeutic drug level monitoring: Secondary | ICD-10-CM | POA: Diagnosis not present

## 2013-06-09 DIAGNOSIS — Z7901 Long term (current) use of anticoagulants: Secondary | ICD-10-CM | POA: Diagnosis not present

## 2013-06-09 DIAGNOSIS — I4892 Unspecified atrial flutter: Secondary | ICD-10-CM

## 2013-06-09 DIAGNOSIS — I4891 Unspecified atrial fibrillation: Secondary | ICD-10-CM | POA: Diagnosis not present

## 2013-06-09 LAB — POCT INR: INR: 2.3

## 2013-06-16 ENCOUNTER — Emergency Department (HOSPITAL_COMMUNITY): Payer: Medicare Other | Admitting: Anesthesiology

## 2013-06-16 ENCOUNTER — Encounter (HOSPITAL_COMMUNITY)
Admission: EM | Disposition: A | Discharge status: Skilled Nursing Facility | Payer: Self-pay | Source: Home / Self Care | Attending: Orthopedic Surgery

## 2013-06-16 ENCOUNTER — Emergency Department (HOSPITAL_COMMUNITY): Payer: Medicare Other

## 2013-06-16 ENCOUNTER — Encounter (HOSPITAL_COMMUNITY): Payer: Self-pay | Admitting: Emergency Medicine

## 2013-06-16 ENCOUNTER — Inpatient Hospital Stay (HOSPITAL_COMMUNITY)
Admission: EM | Admit: 2013-06-16 | Discharge: 2013-06-25 | DRG: 464 | Disposition: A | Discharge status: Skilled Nursing Facility | Payer: Medicare Other | Attending: Orthopedic Surgery | Admitting: Orthopedic Surgery

## 2013-06-16 ENCOUNTER — Encounter (HOSPITAL_COMMUNITY): Payer: Medicare Other | Admitting: Anesthesiology

## 2013-06-16 DIAGNOSIS — S82409B Unspecified fracture of shaft of unspecified fibula, initial encounter for open fracture type I or II: Principal | ICD-10-CM

## 2013-06-16 DIAGNOSIS — W19XXXA Unspecified fall, initial encounter: Secondary | ICD-10-CM | POA: Diagnosis present

## 2013-06-16 DIAGNOSIS — E785 Hyperlipidemia, unspecified: Secondary | ICD-10-CM | POA: Diagnosis present

## 2013-06-16 DIAGNOSIS — E876 Hypokalemia: Secondary | ICD-10-CM | POA: Diagnosis not present

## 2013-06-16 DIAGNOSIS — M6281 Muscle weakness (generalized): Secondary | ICD-10-CM | POA: Diagnosis not present

## 2013-06-16 DIAGNOSIS — Y92009 Unspecified place in unspecified non-institutional (private) residence as the place of occurrence of the external cause: Secondary | ICD-10-CM

## 2013-06-16 DIAGNOSIS — K5900 Constipation, unspecified: Secondary | ICD-10-CM | POA: Diagnosis not present

## 2013-06-16 DIAGNOSIS — I519 Heart disease, unspecified: Secondary | ICD-10-CM | POA: Diagnosis present

## 2013-06-16 DIAGNOSIS — Z7901 Long term (current) use of anticoagulants: Secondary | ICD-10-CM

## 2013-06-16 DIAGNOSIS — S298XXA Other specified injuries of thorax, initial encounter: Secondary | ICD-10-CM | POA: Diagnosis not present

## 2013-06-16 DIAGNOSIS — Z6833 Body mass index (BMI) 33.0-33.9, adult: Secondary | ICD-10-CM

## 2013-06-16 DIAGNOSIS — S82202A Unspecified fracture of shaft of left tibia, initial encounter for closed fracture: Secondary | ICD-10-CM

## 2013-06-16 DIAGNOSIS — R279 Unspecified lack of coordination: Secondary | ICD-10-CM | POA: Diagnosis not present

## 2013-06-16 DIAGNOSIS — Z79899 Other long term (current) drug therapy: Secondary | ICD-10-CM

## 2013-06-16 DIAGNOSIS — S82209B Unspecified fracture of shaft of unspecified tibia, initial encounter for open fracture type I or II: Secondary | ICD-10-CM | POA: Diagnosis not present

## 2013-06-16 DIAGNOSIS — IMO0002 Reserved for concepts with insufficient information to code with codable children: Secondary | ICD-10-CM | POA: Diagnosis not present

## 2013-06-16 DIAGNOSIS — Z96649 Presence of unspecified artificial hip joint: Secondary | ICD-10-CM | POA: Diagnosis not present

## 2013-06-16 DIAGNOSIS — T148XXA Other injury of unspecified body region, initial encounter: Secondary | ICD-10-CM | POA: Diagnosis not present

## 2013-06-16 DIAGNOSIS — D509 Iron deficiency anemia, unspecified: Secondary | ICD-10-CM | POA: Diagnosis present

## 2013-06-16 DIAGNOSIS — Z9181 History of falling: Secondary | ICD-10-CM | POA: Diagnosis not present

## 2013-06-16 DIAGNOSIS — S82843B Displaced bimalleolar fracture of unspecified lower leg, initial encounter for open fracture type I or II: Secondary | ICD-10-CM | POA: Diagnosis not present

## 2013-06-16 DIAGNOSIS — I471 Supraventricular tachycardia: Secondary | ICD-10-CM | POA: Diagnosis not present

## 2013-06-16 DIAGNOSIS — S82209A Unspecified fracture of shaft of unspecified tibia, initial encounter for closed fracture: Secondary | ICD-10-CM | POA: Diagnosis not present

## 2013-06-16 DIAGNOSIS — S81809A Unspecified open wound, unspecified lower leg, initial encounter: Secondary | ICD-10-CM | POA: Diagnosis not present

## 2013-06-16 DIAGNOSIS — K219 Gastro-esophageal reflux disease without esophagitis: Secondary | ICD-10-CM | POA: Diagnosis not present

## 2013-06-16 DIAGNOSIS — M159 Polyosteoarthritis, unspecified: Secondary | ICD-10-CM | POA: Diagnosis not present

## 2013-06-16 DIAGNOSIS — I10 Essential (primary) hypertension: Secondary | ICD-10-CM | POA: Diagnosis present

## 2013-06-16 DIAGNOSIS — S81009A Unspecified open wound, unspecified knee, initial encounter: Secondary | ICD-10-CM | POA: Diagnosis not present

## 2013-06-16 DIAGNOSIS — I4892 Unspecified atrial flutter: Secondary | ICD-10-CM | POA: Diagnosis not present

## 2013-06-16 DIAGNOSIS — E890 Postprocedural hypothyroidism: Secondary | ICD-10-CM | POA: Diagnosis not present

## 2013-06-16 DIAGNOSIS — Z981 Arthrodesis status: Secondary | ICD-10-CM | POA: Diagnosis not present

## 2013-06-16 DIAGNOSIS — E039 Hypothyroidism, unspecified: Secondary | ICD-10-CM | POA: Diagnosis present

## 2013-06-16 DIAGNOSIS — Z8711 Personal history of peptic ulcer disease: Secondary | ICD-10-CM

## 2013-06-16 DIAGNOSIS — G4700 Insomnia, unspecified: Secondary | ICD-10-CM | POA: Diagnosis not present

## 2013-06-16 DIAGNOSIS — E538 Deficiency of other specified B group vitamins: Secondary | ICD-10-CM | POA: Diagnosis present

## 2013-06-16 DIAGNOSIS — S82409A Unspecified fracture of shaft of unspecified fibula, initial encounter for closed fracture: Secondary | ICD-10-CM | POA: Diagnosis not present

## 2013-06-16 DIAGNOSIS — Z01818 Encounter for other preprocedural examination: Secondary | ICD-10-CM | POA: Diagnosis not present

## 2013-06-16 DIAGNOSIS — M79609 Pain in unspecified limb: Secondary | ICD-10-CM | POA: Diagnosis not present

## 2013-06-16 DIAGNOSIS — I4891 Unspecified atrial fibrillation: Secondary | ICD-10-CM | POA: Diagnosis present

## 2013-06-16 DIAGNOSIS — M549 Dorsalgia, unspecified: Secondary | ICD-10-CM | POA: Diagnosis not present

## 2013-06-16 DIAGNOSIS — Z5189 Encounter for other specified aftercare: Secondary | ICD-10-CM | POA: Diagnosis not present

## 2013-06-16 DIAGNOSIS — R262 Difficulty in walking, not elsewhere classified: Secondary | ICD-10-CM | POA: Diagnosis not present

## 2013-06-16 HISTORY — PX: I & D EXTREMITY: SHX5045

## 2013-06-16 HISTORY — PX: EXTERNAL FIXATION LEG: SHX1549

## 2013-06-16 HISTORY — PX: APPLICATION OF WOUND VAC: SHX5189

## 2013-06-16 LAB — CBC WITH DIFFERENTIAL/PLATELET
Basophils Absolute: 0 10*3/uL (ref 0.0–0.1)
Basophils Relative: 1 % (ref 0–1)
Eosinophils Absolute: 0.1 10*3/uL (ref 0.0–0.7)
Eosinophils Relative: 2 % (ref 0–5)
HCT: 42.6 % (ref 36.0–46.0)
Hemoglobin: 14.4 g/dL (ref 12.0–15.0)
Lymphocytes Relative: 24 % (ref 12–46)
Lymphs Abs: 1.5 10*3/uL (ref 0.7–4.0)
MCH: 36.5 pg — ABNORMAL HIGH (ref 26.0–34.0)
MCHC: 33.8 g/dL (ref 30.0–36.0)
MCV: 107.8 fL — ABNORMAL HIGH (ref 78.0–100.0)
Monocytes Absolute: 0.9 10*3/uL (ref 0.1–1.0)
Monocytes Relative: 13 % — ABNORMAL HIGH (ref 3–12)
Neutro Abs: 4 10*3/uL (ref 1.7–7.7)
Neutrophils Relative %: 60 % (ref 43–77)
Platelets: 203 10*3/uL (ref 150–400)
RBC: 3.95 MIL/uL (ref 3.87–5.11)
RDW: 12.7 % (ref 11.5–15.5)
WBC: 6.5 10*3/uL (ref 4.0–10.5)

## 2013-06-16 LAB — COMPREHENSIVE METABOLIC PANEL
ALT: 23 U/L (ref 0–35)
AST: 26 U/L (ref 0–37)
Albumin: 3.7 g/dL (ref 3.5–5.2)
Alkaline Phosphatase: 90 U/L (ref 39–117)
BUN: 13 mg/dL (ref 6–23)
CO2: 23 mEq/L (ref 19–32)
Calcium: 8.8 mg/dL (ref 8.4–10.5)
Chloride: 101 mEq/L (ref 96–112)
Creatinine, Ser: 1.1 mg/dL (ref 0.50–1.10)
GFR calc Af Amer: 52 mL/min — ABNORMAL LOW (ref >90)
GFR calc non Af Amer: 44 mL/min — ABNORMAL LOW (ref >90)
Glucose, Bld: 88 mg/dL (ref 70–99)
Potassium: 3.6 mEq/L — ABNORMAL LOW (ref 3.7–5.3)
Sodium: 139 mEq/L (ref 137–147)
Total Bilirubin: 0.8 mg/dL (ref 0.3–1.2)
Total Protein: 7.4 g/dL (ref 6.0–8.3)

## 2013-06-16 LAB — PROTIME-INR
INR: 2.59 — ABNORMAL HIGH (ref 0.00–1.49)
Prothrombin Time: 26.9 seconds — ABNORMAL HIGH (ref 11.6–15.2)

## 2013-06-16 LAB — MAGNESIUM: Magnesium: 1.8 mg/dL (ref 1.5–2.5)

## 2013-06-16 LAB — CK: Total CK: 101 U/L (ref 7–177)

## 2013-06-16 SURGERY — EXTERNAL FIXATION, LOWER EXTREMITY
Anesthesia: General | Laterality: Left

## 2013-06-16 MED ORDER — PHENYLEPHRINE HCL 10 MG/ML IJ SOLN
10.0000 mg | INTRAVENOUS | Status: DC | PRN
  Administered 2013-06-16: 20 ug/min via INTRAVENOUS

## 2013-06-16 MED ORDER — METOPROLOL SUCCINATE ER 50 MG PO TB24
50.0000 mg | ORAL_TABLET | Freq: Two times a day (BID) | ORAL | Status: DC
  Administered 2013-06-17 – 2013-06-25 (×16): 50 mg via ORAL
  Filled 2013-06-16 (×19): qty 1

## 2013-06-16 MED ORDER — CEFAZOLIN SODIUM 1-5 GM-% IV SOLN
1.0000 g | Freq: Once | INTRAVENOUS | Status: AC
  Administered 2013-06-16: 1 g via INTRAVENOUS
  Filled 2013-06-16: qty 50

## 2013-06-16 MED ORDER — FENTANYL CITRATE 0.05 MG/ML IJ SOLN
INTRAMUSCULAR | Status: AC
  Filled 2013-06-16: qty 5

## 2013-06-16 MED ORDER — GENTAMICIN SULFATE 40 MG/ML IJ SOLN
INTRAMUSCULAR | Status: AC
  Filled 2013-06-16: qty 2

## 2013-06-16 MED ORDER — FENTANYL CITRATE 0.05 MG/ML IJ SOLN
INTRAMUSCULAR | Status: DC | PRN
  Administered 2013-06-16 (×2): 100 ug via INTRAVENOUS
  Administered 2013-06-17: 50 ug via INTRAVENOUS

## 2013-06-16 MED ORDER — ATORVASTATIN CALCIUM 10 MG PO TABS
10.0000 mg | ORAL_TABLET | Freq: Every evening | ORAL | Status: DC
  Administered 2013-06-17 – 2013-06-24 (×7): 10 mg via ORAL
  Filled 2013-06-16 (×9): qty 1

## 2013-06-16 MED ORDER — TEMAZEPAM 15 MG PO CAPS
15.0000 mg | ORAL_CAPSULE | Freq: Every evening | ORAL | Status: DC | PRN
  Administered 2013-06-17 – 2013-06-19 (×3): 15 mg via ORAL
  Filled 2013-06-16 (×3): qty 1

## 2013-06-16 MED ORDER — SODIUM CHLORIDE 0.9 % IV SOLN
INTRAVENOUS | Status: DC | PRN
  Administered 2013-06-16: 23:00:00 via INTRAVENOUS

## 2013-06-16 MED ORDER — PROPOFOL 10 MG/ML IV BOLUS
INTRAVENOUS | Status: DC | PRN
  Administered 2013-06-16: 120 mg via INTRAVENOUS

## 2013-06-16 MED ORDER — MORPHINE SULFATE 2 MG/ML IJ SOLN
1.0000 mg | INTRAMUSCULAR | Status: DC | PRN
  Administered 2013-06-17 (×5): 2 mg via INTRAVENOUS
  Filled 2013-06-16 (×5): qty 1

## 2013-06-16 MED ORDER — NEOSTIGMINE METHYLSULFATE 10 MG/10ML IV SOLN
INTRAVENOUS | Status: AC
  Filled 2013-06-16: qty 1

## 2013-06-16 MED ORDER — ROCURONIUM BROMIDE 100 MG/10ML IV SOLN
INTRAVENOUS | Status: DC | PRN
  Administered 2013-06-16: 10 mg via INTRAVENOUS
  Administered 2013-06-16: 30 mg via INTRAVENOUS

## 2013-06-16 MED ORDER — SUCCINYLCHOLINE CHLORIDE 20 MG/ML IJ SOLN
INTRAMUSCULAR | Status: DC | PRN
  Administered 2013-06-16: 100 mg via INTRAVENOUS

## 2013-06-16 MED ORDER — POTASSIUM CHLORIDE CRYS ER 20 MEQ PO TBCR: 20.0000 meq | EXTENDED_RELEASE_TABLET | Freq: Once | ORAL | Status: DC

## 2013-06-16 MED ORDER — PROPOFOL 10 MG/ML IV BOLUS
INTRAVENOUS | Status: AC
  Filled 2013-06-16: qty 20

## 2013-06-16 MED ORDER — VANCOMYCIN HCL 1000 MG IV SOLR
INTRAVENOUS | Status: AC
  Filled 2013-06-16: qty 1000

## 2013-06-16 MED ORDER — VANCOMYCIN HCL 500 MG IV SOLR
500.0000 mg | INTRAVENOUS | Status: AC
  Administered 2013-06-17: 1000 mg

## 2013-06-16 MED ORDER — ROCURONIUM BROMIDE 50 MG/5ML IV SOLN
INTRAVENOUS | Status: AC
  Filled 2013-06-16: qty 1

## 2013-06-16 MED ORDER — SODIUM CHLORIDE 0.9 % IV SOLN
INTRAVENOUS | Status: DC
  Administered 2013-06-16 – 2013-06-17 (×2): via INTRAVENOUS

## 2013-06-16 MED ORDER — LIDOCAINE HCL (CARDIAC) 20 MG/ML IV SOLN
INTRAVENOUS | Status: DC | PRN
  Administered 2013-06-16: 50 mg via INTRAVENOUS

## 2013-06-16 MED ORDER — MUPIROCIN CALCIUM 2 % EX CREA
TOPICAL_CREAM | CUTANEOUS | Status: AC
  Filled 2013-06-16: qty 15

## 2013-06-16 MED ORDER — EPHEDRINE SULFATE 50 MG/ML IJ SOLN
INTRAMUSCULAR | Status: AC
  Filled 2013-06-16: qty 1

## 2013-06-16 MED ORDER — LEVOTHYROXINE SODIUM 25 MCG PO TABS
25.0000 ug | ORAL_TABLET | Freq: Every day | ORAL | Status: DC
  Administered 2013-06-17 – 2013-06-25 (×9): 25 ug via ORAL
  Filled 2013-06-16 (×11): qty 1

## 2013-06-16 MED ORDER — PANTOPRAZOLE SODIUM 40 MG PO TBEC
40.0000 mg | DELAYED_RELEASE_TABLET | Freq: Every day | ORAL | Status: DC
  Administered 2013-06-17 – 2013-06-25 (×9): 40 mg via ORAL
  Filled 2013-06-16 (×9): qty 1

## 2013-06-16 MED ORDER — LIDOCAINE HCL (CARDIAC) 20 MG/ML IV SOLN
INTRAVENOUS | Status: AC
  Filled 2013-06-16: qty 5

## 2013-06-16 MED ORDER — VITAMIN K1 10 MG/ML IJ SOLN: 5.0000 mg | Freq: Once | INTRAVENOUS | Status: DC

## 2013-06-16 MED ORDER — DILTIAZEM HCL ER COATED BEADS 120 MG PO CP24
120.0000 mg | ORAL_CAPSULE | Freq: Every day | ORAL | Status: DC
  Administered 2013-06-18 – 2013-06-25 (×8): 120 mg via ORAL
  Filled 2013-06-16 (×9): qty 1

## 2013-06-16 MED ORDER — IRON 325 (65 FE) MG PO TABS: 1.0000 | ORAL_TABLET | Freq: Every day | ORAL | Status: DC

## 2013-06-16 MED ORDER — VITAMIN C 500 MG PO TABS
500.0000 mg | ORAL_TABLET | Freq: Every day | ORAL | Status: DC
  Administered 2013-06-18 – 2013-06-25 (×8): 500 mg via ORAL
  Filled 2013-06-16 (×9): qty 1

## 2013-06-16 MED ORDER — GENTAMICIN SULFATE 40 MG/ML IJ SOLN
120.0000 mg | Freq: Once | INTRAMUSCULAR | Status: AC
  Administered 2013-06-17: 80 mg via INTRAMUSCULAR

## 2013-06-16 MED ORDER — SODIUM CHLORIDE 0.9 % IJ SOLN
INTRAMUSCULAR | Status: AC
  Filled 2013-06-16: qty 10

## 2013-06-16 MED ORDER — ONDANSETRON HCL 4 MG/2ML IJ SOLN
INTRAMUSCULAR | Status: AC
  Filled 2013-06-16: qty 2

## 2013-06-16 MED ORDER — POTASSIUM CHLORIDE CRYS ER 20 MEQ PO TBCR
20.0000 meq | EXTENDED_RELEASE_TABLET | Freq: Every day | ORAL | Status: DC
  Administered 2013-06-18 – 2013-06-22 (×5): 20 meq via ORAL
  Filled 2013-06-16 (×6): qty 1

## 2013-06-16 MED ORDER — LACTATED RINGERS IV SOLN
INTRAVENOUS | Status: DC | PRN
  Administered 2013-06-16: 23:00:00 via INTRAVENOUS

## 2013-06-16 MED ORDER — GLYCOPYRROLATE 0.2 MG/ML IJ SOLN
INTRAMUSCULAR | Status: AC
  Filled 2013-06-16: qty 3

## 2013-06-16 MED ORDER — TETANUS-DIPHTH-ACELL PERTUSSIS 5-2.5-18.5 LF-MCG/0.5 IM SUSP
0.5000 mL | Freq: Once | INTRAMUSCULAR | Status: AC
Start: 2013-06-16 — End: 2013-06-16
  Administered 2013-06-16: 0.5 mL via INTRAMUSCULAR
  Filled 2013-06-16: qty 0.5

## 2013-06-16 MED ORDER — FENTANYL CITRATE 0.05 MG/ML IJ SOLN
100.0000 ug | Freq: Once | INTRAMUSCULAR | Status: AC
  Administered 2013-06-16: 100 ug via INTRAVENOUS
  Filled 2013-06-16: qty 2

## 2013-06-16 MED ORDER — VITAMIN D3 25 MCG (1000 UNIT) PO TABS
1000.0000 [IU] | ORAL_TABLET | Freq: Every day | ORAL | Status: DC
  Administered 2013-06-18 – 2013-06-25 (×8): 1000 [IU] via ORAL
  Filled 2013-06-16 (×9): qty 1

## 2013-06-16 SURGICAL SUPPLY — 94 items
250 MM BAR FOR EX FIX ×4 IMPLANT
APL SKNCLS STERI-STRIP NONHPOA (GAUZE/BANDAGES/DRESSINGS) ×2
BANDAGE ELASTIC 4 VELCRO ST LF (GAUZE/BANDAGES/DRESSINGS) ×2 IMPLANT
BANDAGE ELASTIC 6 VELCRO ST LF (GAUZE/BANDAGES/DRESSINGS) ×3 IMPLANT
BANDAGE ESMARK 6X9 LF (GAUZE/BANDAGES/DRESSINGS) ×1 IMPLANT
BANDAGE GAUZE ELAST BULKY 4 IN (GAUZE/BANDAGES/DRESSINGS) ×4 IMPLANT
BAR EXFIX SM BONE 10.5X250 (Trauma) ×4 IMPLANT
BAR TO BAR CLAMP ×4 IMPLANT
BENZOIN TINCTURE PRP APPL 2/3 (GAUZE/BANDAGES/DRESSINGS) ×2 IMPLANT
BIT DRILL 3.5 SHOFT HALF PIN (BIT) ×1 IMPLANT
BNDG CMPR 9X6 STRL LF SNTH (GAUZE/BANDAGES/DRESSINGS) ×1
BNDG COHESIVE 4X5 TAN STRL (GAUZE/BANDAGES/DRESSINGS) IMPLANT
BNDG COHESIVE 6X5 TAN STRL LF (GAUZE/BANDAGES/DRESSINGS) ×2 IMPLANT
BNDG ESMARK 6X9 LF (GAUZE/BANDAGES/DRESSINGS) ×2
CALCANEAL PIN ×1 IMPLANT
CANISTER WOUND CARE 500ML ATS (WOUND CARE) ×1 IMPLANT
CATH FOLEY 2WAY SLVR  5CC 16FR (CATHETERS) ×1
CATH FOLEY 2WAY SLVR 5CC 16FR (CATHETERS) IMPLANT
CLAMP BAR TO BAR (Clamp) ×4 IMPLANT
CLAMP PIN TO BAR (Clamp) ×6 IMPLANT
CLEANER TIP ELECTROSURG 2X2 (MISCELLANEOUS) ×2 IMPLANT
COVER MAYO STAND STRL (DRAPES) ×1 IMPLANT
COVER SURGICAL LIGHT HANDLE (MISCELLANEOUS) ×2 IMPLANT
CUFF TOURNIQUET SINGLE 18IN (TOURNIQUET CUFF) ×2 IMPLANT
CUFF TOURNIQUET SINGLE 24IN (TOURNIQUET CUFF) IMPLANT
CUFF TOURNIQUET SINGLE 34IN LL (TOURNIQUET CUFF) IMPLANT
CUFF TOURNIQUET SINGLE 44IN (TOURNIQUET CUFF) IMPLANT
DRAPE C-ARM 42X72 X-RAY (DRAPES) IMPLANT
DRAPE INCISE IOBAN 66X45 STRL (DRAPES) ×1 IMPLANT
DRAPE OEC MINIVIEW 54X84 (DRAPES) ×2 IMPLANT
DRAPE PROXIMA HALF (DRAPES) ×2 IMPLANT
DRAPE U-SHAPE 47X51 STRL (DRAPES) ×2 IMPLANT
DRSG ADAPTIC 3X8 NADH LF (GAUZE/BANDAGES/DRESSINGS) ×2 IMPLANT
DRSG PAD ABDOMINAL 8X10 ST (GAUZE/BANDAGES/DRESSINGS) IMPLANT
DRSG VAC ATS MED SENSATRAC (GAUZE/BANDAGES/DRESSINGS) ×1 IMPLANT
DURAPREP 26ML APPLICATOR (WOUND CARE) ×2 IMPLANT
ELECT REM PT RETURN 9FT ADLT (ELECTROSURGICAL) ×2
ELECTRODE REM PT RTRN 9FT ADLT (ELECTROSURGICAL) ×1 IMPLANT
EVACUATOR 1/8 PVC DRAIN (DRAIN) IMPLANT
FACESHIELD WRAPAROUND (MASK) ×2 IMPLANT
FACESHIELD WRAPAROUND OR TEAM (MASK) ×1 IMPLANT
GAUZE SPONGE 2X2 8PLY STRL LF (GAUZE/BANDAGES/DRESSINGS) IMPLANT
GAUZE XEROFORM 5X9 LF (GAUZE/BANDAGES/DRESSINGS) IMPLANT
GLOVE BIOGEL PI IND STRL 8 (GLOVE) ×1 IMPLANT
GLOVE BIOGEL PI INDICATOR 8 (GLOVE) ×1
GLOVE SURG ORTHO 8.0 STRL STRW (GLOVE) ×2 IMPLANT
GOWN STRL REUS W/ TWL LRG LVL3 (GOWN DISPOSABLE) ×3 IMPLANT
GOWN STRL REUS W/ TWL XL LVL3 (GOWN DISPOSABLE) ×1 IMPLANT
GOWN STRL REUS W/TWL LRG LVL3 (GOWN DISPOSABLE) ×6
GOWN STRL REUS W/TWL XL LVL3 (GOWN DISPOSABLE) ×2
HANDPIECE INTERPULSE COAX TIP (DISPOSABLE)
KIT BASIN OR (CUSTOM PROCEDURE TRAY) ×2 IMPLANT
KIT ROOM TURNOVER OR (KITS) ×2 IMPLANT
KIT STIMULAN RAPID CURE 5CC (Orthopedic Implant) ×1 IMPLANT
MANIFOLD NEPTUNE II (INSTRUMENTS) ×2 IMPLANT
NEEDLE 22X1 1/2 (OR ONLY) (NEEDLE) IMPLANT
NS IRRIG 1000ML POUR BTL (IV SOLUTION) ×2 IMPLANT
PACK ORTHO EXTREMITY (CUSTOM PROCEDURE TRAY) ×2 IMPLANT
PAD ARMBOARD 7.5X6 YLW CONV (MISCELLANEOUS) ×4 IMPLANT
PAD CAST 4YDX4 CTTN HI CHSV (CAST SUPPLIES) IMPLANT
PADDING CAST COTTON 4X4 STRL (CAST SUPPLIES)
PADDING CAST COTTON 6X4 STRL (CAST SUPPLIES) ×5 IMPLANT
PIN CALCANEAL (Pin) ×1 IMPLANT
PIN EX-FIX LEG 5X45 (PIN) ×2 IMPLANT
PIN TO BAR CLAMP ×6 IMPLANT
SET HNDPC FAN SPRY TIP SCT (DISPOSABLE) IMPLANT
SPONGE GAUZE 2X2 STER 10/PKG (GAUZE/BANDAGES/DRESSINGS) ×4
SPONGE GAUZE 4X4 12PLY (GAUZE/BANDAGES/DRESSINGS) ×2 IMPLANT
SPONGE LAP 18X18 X RAY DECT (DISPOSABLE) ×7 IMPLANT
SPONGE LAP 4X18 X RAY DECT (DISPOSABLE) ×2 IMPLANT
SPONGE SCRUB IODOPHOR (GAUZE/BANDAGES/DRESSINGS) ×2 IMPLANT
STAPLER VISISTAT 35W (STAPLE) IMPLANT
STOCKINETTE IMPERVIOUS 9X36 MD (GAUZE/BANDAGES/DRESSINGS) ×2 IMPLANT
STOCKINETTE IMPERVIOUS LG (DRAPES) ×2 IMPLANT
STRIP CLOSURE SKIN 1/2X4 (GAUZE/BANDAGES/DRESSINGS) IMPLANT
SUCTION FRAZIER TIP 10 FR DISP (SUCTIONS) IMPLANT
SUT ETHILON 2 0 FS 18 (SUTURE) ×6 IMPLANT
SUT ETHILON 3 0 PS 1 (SUTURE) IMPLANT
SUT ETHILON 4 0 PS 2 18 (SUTURE) IMPLANT
SUT PROLENE 3 0 PS 2 (SUTURE) IMPLANT
SUT VIC AB 0 CT1 27 (SUTURE) ×4
SUT VIC AB 0 CT1 27XBRD ANBCTR (SUTURE) ×2 IMPLANT
SUT VIC AB 2-0 CT1 27 (SUTURE) ×4
SUT VIC AB 2-0 CT1 TAPERPNT 27 (SUTURE) ×2 IMPLANT
SUT VIC AB 3-0 SH 27 (SUTURE)
SUT VIC AB 3-0 SH 27X BRD (SUTURE) IMPLANT
SYR CONTROL 10ML LL (SYRINGE) IMPLANT
TOWEL OR 17X24 6PK STRL BLUE (TOWEL DISPOSABLE) ×4 IMPLANT
TOWEL OR 17X26 10 PK STRL BLUE (TOWEL DISPOSABLE) ×2 IMPLANT
TUBE ANAEROBIC SPECIMEN COL (MISCELLANEOUS) IMPLANT
TUBE CONNECTING 12X1/4 (SUCTIONS) ×2 IMPLANT
UNDERPAD 30X30 INCONTINENT (UNDERPADS AND DIAPERS) ×2 IMPLANT
WATER STERILE IRR 1000ML POUR (IV SOLUTION) ×4 IMPLANT
YANKAUER SUCT BULB TIP NO VENT (SUCTIONS) ×3 IMPLANT

## 2013-06-16 NOTE — ED Notes (Signed)
NT Transferred patient to the OR.

## 2013-06-16 NOTE — Consult Note (Signed)
Hospitalist Consult Note   Patient name: [name] Medical record number: [id number] Date of birth: 05-Feb-1925 Age: 80 y.o. Gender: female  Primary Care Provider: Walker Kehr, MD  Chief Complaint: mechanical fall, left open tib fib fracture  History of Present Illness:This is a 80 y.o. year old female with prior history of atrial fibrillation on Coumadin, hypothyroidism, renal insufficiency presenting with mechanical fall and right open tib-fib fracture. Per report, patient is currently moving. He accidentally fell, distorting the left lower leg. Developed left open to fit fracture at time of fall. Subsequently brought by EMS for further evaluation. Had a comminuted fractures involving the distal tibial and fibular diaphyses, proximal fibular diaphysis. CXRF without any acute abnormalities. Mildly enlarged cardiac silohuette. INR 2.59. K @ 3.6.  Orthopedics consult. They will admit the patient. Plan for washout of affected area tonight with plans formal surgery over the weekend. Medicine consulted to help manage nonsurgical issues including anticoagulation.   Assessment and Plan: MAKITA BLOW is a 80 y.o. year old female presenting with mechanical fall, open tib/fib fracture  Active Problems:   * No active hospital problems. *  L tib/fib fracture: per ortho recs. Pain control. Continue to follow.   Afib/HTN: On coumadin chronically. Will transition to lovenox (discussed heparin gtt. Dr Marlou Sa would prefer lovenox). Continue home regimen. Telemetry bed. Continue to follow. Midly enlarged cardiac silohuette on imaging. Check pro BNP. Hold lasix pending OR in case of severe blood loss. Vitamin K IV x1 to help normalize INR pending OR tonight.   FEN/GI: heart healthy diet s/p OR.  Prophylaxis: lovenox  Disposition: pending further evaluation  Code Status:Full Code     Patient Active Problem List   Diagnosis Date Noted  . Encounter for therapeutic drug monitoring 02/16/2013  . Well  adult exam 07/21/2012  . Paresthesia 01/28/2012  . Hypokalemia 02/18/2011  . Chronic venous insufficiency 11/11/2010  . UTI (lower urinary tract infection) 05/30/2010  . Actinic keratoses 05/30/2010  . Pre-operative cardiovascular examination 05/10/2010  . Long term current use of anticoagulant 03/12/2010  . GROIN PAIN 02/26/2010  . PEPTIC ULCER DISEASE 11/22/2009  . SKIN RASH 11/22/2009  . Iron deficiency anemia, unspecified 10/31/2009  . HIP PAIN 10/31/2009  . NONSPECIFIC ABNORMAL FINDING IN STOOL CONTENTS 10/26/2009  . HYPOTHYROIDISM 10/25/2009  . MELENA 10/25/2009  . RENAL INSUFFICIENCY 03/19/2009  . SYNCOPE 03/19/2009  . Edema 02/12/2009  . INSOMNIA, PERSISTENT 11/07/2008  . SPINAL STENOSIS 09/04/2008  . Campath-induced atrial fibrillation 07/18/2008  . Atrial flutter 05/31/2008  . FEVER UNSPECIFIED 10/05/2007  . FATIGUE 10/05/2007  . DYSPNEA 03/02/2007  . ACUTE BRONCHITIS 02/25/2007  . HYPERTENSION 11/30/2006  . OSTEOARTHRITIS 11/30/2006  . LOW BACK PAIN 11/30/2006  . OSTEOPENIA 11/30/2006   Past Medical History: Past Medical History  Diagnosis Date  . Paroxysmal atrial fibrillation      on coumadin therapy  . Hypertension   . LBP (low back pain)     severe lumbar spondylosis s/p L3/L4,L4/L5 fusion 12/10  . OA (osteoarthritis)     Dr. Cay Schillings  . Osteopenia   . Elevated MCV   . Constipation     ischemic colitis with rectal bleeding   . B12 deficiency     boderline  . Renal insufficiency   . Hypothyroidism 2011  . NSAID-associated gastropathy 2011    with bleed  . Iron deficiency anemia 2011    elevated MCV  . Peptic ulcer disease     Past Surgical History: Past Surgical History  Procedure Laterality Date  . Tonsillectomy    . Rotator cuff repair  1997    Left  . Back surgery  12/2008    L3/L4/L fusion  . Spine surgery    . Total hip arthroplasty  2012    Social History: History   Social History  . Marital Status: Married    Spouse Name:  N/A    Number of Children: 2  . Years of Education: N/A   Occupational History  . retired Unemployed   Social History Main Topics  . Smoking status: Never Smoker   . Smokeless tobacco: None  . Alcohol Use: 4.2 oz/week    7 Glasses of wine per week     Comment: 1 glass daily  . Drug Use: No  . Sexual Activity: Not Currently   Other Topics Concern  . None   Social History Narrative  . None    Family History: Family History  Problem Relation Age of Onset  . Cancer Father   . Hypertension Other     Allergies: Allergies  Allergen Reactions  . Atenolol     REACTION: sick  . Clarithromycin     REACTION: nausea  . Codeine Sulfate     REACTION: nausea  . Macrodantin Other (See Comments)    Long time ago - reaction unknown  . Percocet [Oxycodone-Acetaminophen] Nausea And Vomiting    Current Facility-Administered Medications  Medication Dose Route Frequency Provider Last Rate Last Dose  . 0.9 %  sodium chloride infusion   Intravenous Continuous Linus Mako, PA-C 50 mL/hr at 06/16/13 2102    . atorvastatin (LIPITOR) tablet 10 mg  10 mg Oral QPM Shanda Howells, MD      . Derrill Memo ON 06/17/2013] cholecalciferol (VITAMIN D) tablet 1,000 Units  1,000 Units Oral Daily Shanda Howells, MD      . Derrill Memo ON 06/17/2013] diltiazem (CARDIZEM CD) 24 hr capsule 120 mg  120 mg Oral Daily Shanda Howells, MD      . Derrill Memo ON 06/17/2013] Iron TABS 1 tablet  1 tablet Oral Daily Shanda Howells, MD      . Derrill Memo ON 06/17/2013] levothyroxine (SYNTHROID, LEVOTHROID) tablet 25 mcg  25 mcg Oral QAC breakfast Shanda Howells, MD      . metoprolol succinate (TOPROL-XL) 24 hr tablet 50 mg  50 mg Oral BID Shanda Howells, MD      . morphine 2 MG/ML injection 1-2 mg  1-2 mg Intravenous Q2H PRN Shanda Howells, MD      . Derrill Memo ON 06/17/2013] pantoprazole (PROTONIX) EC tablet 40 mg  40 mg Oral QAC breakfast Shanda Howells, MD      . Derrill Memo ON 06/17/2013] potassium chloride SA (K-DUR,KLOR-CON) CR tablet 20 mEq  20 mEq  Oral Daily Shanda Howells, MD      . temazepam (RESTORIL) capsule 15 mg  15 mg Oral QHS PRN Shanda Howells, MD      . Derrill Memo ON 06/17/2013] vitamin C (ASCORBIC ACID) tablet 500 mg  500 mg Oral Daily Shanda Howells, MD       Current Outpatient Prescriptions  Medication Sig Dispense Refill  . acetaminophen (TYLENOL) 500 MG tablet Take 500 mg by mouth daily as needed for pain.      [name] atorvastatin (LIPITOR) 10 MG tablet Take 10 mg by mouth every evening.      . B Complex-C (B-COMPLEX WITH VITAMIN C) tablet Take 1 tablet by mouth daily.      . Cholecalciferol (VITAMIN D3) 1000 UNITS tablet Take  1,000 Units by mouth daily.        . clobetasol cream (TEMOVATE) 0.16 % Apply 1 application topically 2 (two) times daily as needed (itching). As directed      . diltiazem (CARDIZEM CD) 120 MG 24 hr capsule Take 1 capsule (120 mg total) by mouth daily.  90 capsule  1  . Ferrous Sulfate (IRON) 325 (65 FE) MG TABS Take 1 tablet by mouth daily.      . furosemide (LASIX) 40 MG tablet Take 40 mg by mouth 2 (two) times daily.      [name] levothyroxine (SYNTHROID, LEVOTHROID) 25 MCG tablet Take 25 mcg by mouth daily before breakfast.      . metoprolol succinate (TOPROL-XL) 50 MG 24 hr tablet Take 1 tablet (50 mg total) by mouth 2 (two) times daily. Take with or immediately following a meal.  180 tablet  1  . pantoprazole (PROTONIX) 40 MG tablet Take 1 tablet (40 mg total) by mouth daily before breakfast.  90 tablet  3  . potassium chloride SA (K-DUR,KLOR-CON) 20 MEQ tablet Take 1 tablet (20 mEq total) by mouth daily.  90 tablet  1  . temazepam (RESTORIL) 30 MG capsule TAKE ONE CAPSULE AT BEDTIME AS NEEDED  30 capsule  5  . vitamin C (ASCORBIC ACID) 500 MG tablet Take 500 mg by mouth daily. Take with iron dose      . warfarin (COUMADIN) 5 MG tablet Take 2.5-5 mg by mouth See admin instructions. Take 2.5 mg by mouth each night, except on Monday and Friday take 5 mg by mouth at night.       Review Of Systems: 12 point ROS  negative except as noted above in HPI.  Physical Exam: Filed Vitals:   06/16/13 2115  BP: 136/72  Pulse: 87  Temp:   Resp: 27    General: alert and cooperative HEENT: PERRLA and extra ocular movement intact Heart: S1, S2 normal, no murmur, rub or gallop, regular rate and rhythm Lungs: clear to auscultation, no wheezes or rales and unlabored breathing Abdomen: abdomen is soft without significant tenderness, masses, organomegaly or guarding Extremities: extensive LLE laceration, neurovascularly intact distally  Skin:no rashes, no ecchymoses Neurology: normal without focal findings  Labs and Imaging: Lab Results  Component Value Date/Time   NA 139 06/16/2013  8:33 PM   K 3.6* 06/16/2013  8:33 PM   CL 101 06/16/2013  8:33 PM   CO2 23 06/16/2013  8:33 PM   BUN 13 06/16/2013  8:33 PM   CREATININE 1.10 06/16/2013  8:33 PM   GLUCOSE 88 06/16/2013  8:33 PM   Lab Results  Component Value Date   WBC 6.5 06/16/2013   HGB 14.4 06/16/2013   HCT 42.6 06/16/2013   MCV 107.8* 06/16/2013   PLT 203 06/16/2013    Dg Chest Portable 1 View  06/16/2013   CLINICAL DATA:  Leg pain post fall  EXAM: PORTABLE CHEST - 1 VIEW  COMPARISON:  Portable exam at 2102 hrs compared to 06/15/2012  FINDINGS: Enlargement of cardiac silhouette.  Mediastinal contours and pulmonary vascularity normal.  Numerous EKG leads project over chest particularly RIGHT middle lobe.  Chronic subsegmental atelectasis versus scarring in RIGHT middle lobe, superimposed with EKG leads, better visualized on previous exam.  No definite acute infiltrate, pleural effusion or pneumothorax.  Bones demineralized.  IMPRESSION: Enlargement of cardiac silhouette.  Chronic subsegmental atelectasis versus scarring in RIGHT middle lobe.   Electronically Signed   By: Crist Infante.D.  On: 06/16/2013 21:35   Dg Tibia/fibula Left Port  06/16/2013   CLINICAL DATA:  Status post fall; left lower leg pain and open deformity.  EXAM: PORTABLE LEFT TIBIA AND FIBULA  - 2 VIEW  COMPARISON:  None.  FINDINGS: There are comminuted fractures involving the distal tibial and fibular diaphyses, with one shaft width anterior and lateral displacement of the distal tibia, shortening at the fracture site, and more than one shaft width lateral displacement of the distal fibula. Scattered soft tissue air is seen, reflecting the open nature of the fracture, with marked associated soft tissue disruption.  The ankle mortise appears grossly intact. The distal interosseous space is grossly preserved.  There is also a mildly comminuted fracture involving the proximal fibular diaphysis, with associated soft tissue swelling. Given the degree of soft tissue injury, would follow up to ensure that no compartment syndrome or rhabdomyolysis develops.  No significant knee joint effusion is identified. There is mild cortical irregularity along the articular surface of the patella. Scattered vascular calcifications are seen.  IMPRESSION: 1. Comminuted fractures involving the distal tibial and fibular diaphyses, with one shaft width anterior and lateral displacement of the distal tibia and shortening at the fracture site, and more than one shaft width lateral displacement of the distal fibula. This is an open fracture, with marked soft tissue disruption and scattered soft tissue air. 2. Mildly comminuted fracture involving the proximal fibular diaphysis, with associated soft tissue swelling. 3. Given the degree of soft tissue injury, would follow up to ensure that no compartment syndrome or rhabdomyolysis develops.   Electronically Signed   By: Garald Balding M.D.   On: 06/16/2013 21:35   Dg Foot 2 Views Left  06/16/2013   CLINICAL DATA:  Status post fall; left lower leg pain and open deformity.  EXAM: LEFT FOOT - 2 VIEW  COMPARISON:  None.  FINDINGS: The comminuted and displaced fractures through the distal diaphyses of the tibia and fibula are better characterized on concurrent tibia/fibula radiographs.   No additional fractures are seen. Visualized joint spaces are grossly preserved. The subtalar joint is unremarkable in appearance. Tiny plantar and posterior calcaneal spurs are noted.  Marked soft tissue disruption is noted about the distal lower leg, with prominent soft tissue air.  IMPRESSION: 1. Comminuted displaced fractures through the distal diaphyses of the tibia and fibula are better characterized on concurrent tibia/fibula radiographs. 2. No additional fractures seen. 3. Marked soft tissue disruption about the distal lower leg, with prominent soft tissue air.   Electronically Signed   By: Garald Balding M.D.   On: 06/16/2013 21:37           Shanda Howells MD  Pager: [phone]

## 2013-06-16 NOTE — ED Notes (Addendum)
NT at the bedside removing clothes and jewelry for patient to go to OR. Admitting Physician at the bedside.

## 2013-06-16 NOTE — ED Notes (Addendum)
Per EMS, Patient slipped in the kitchen and was not able to catch her balance when her left foot went out from under her. Left foot is an open fracture. Vitals per EMS: 166/88, 81 HR, 16 RR, 99 % on RA. Patient was given 100 mcg of Fentanyl at [facility].

## 2013-06-16 NOTE — Progress Notes (Signed)
ANTICOAGULATION CONSULT NOTE - Initial Consult  Pharmacy Consult for Lovenox  Indication: atrial fibrillation  Allergies  Allergen Reactions  . Atenolol     REACTION: sick  . Clarithromycin     REACTION: nausea  . Codeine Sulfate     REACTION: nausea  . Macrodantin Other (See Comments)    Long time ago - reaction unknown  . Percocet [Oxycodone-Acetaminophen] Nausea And Vomiting     Vital Signs: Temp: 98 F (36.7 C) (05/28 2011) Temp src: Oral (05/28 2011) BP: 142/90 mmHg (05/28 2215) Pulse Rate: 86 (05/28 2215)  Labs:  Recent Labs  06/16/13 2033  HGB 14.4  HCT 42.6  PLT 203  LABPROT 26.9*  INR 2.59*  CREATININE 1.10  CKTOTAL 101    Medical History: Past Medical History  Diagnosis Date  . Paroxysmal atrial fibrillation      on coumadin therapy  . Hypertension   . LBP (low back pain)     severe lumbar spondylosis s/p L3/L4,L4/L5 fusion 12/10  . OA (osteoarthritis)     Dr. Cay Schillings  . Osteopenia   . Elevated MCV   . Constipation     ischemic colitis with rectal bleeding   . B12 deficiency     boderline  . Renal insufficiency   . Hypothyroidism 2011  . NSAID-associated gastropathy 2011    with bleed  . Iron deficiency anemia 2011    elevated MCV  . Peptic ulcer disease     Assessment: 80 y/o on warfarin PTA, consulted to dose Lovenox per pharmacy for afib (Dr. Marlou Sa with ortho prefers Lovenox per MD note). INR on admit is 2.59, Vit K ordered x 1.     Goal of Therapy:  Monitor platelets by anticoagulation protocol: Yes   Plan:  -Start Lovenox when INR <2 -Daily PT/INR -Monitor for bleeding  Narda Bonds 06/16/2013,10:57 PM

## 2013-06-16 NOTE — ED Notes (Signed)
Phlebotomy at the bedside  

## 2013-06-16 NOTE — ED Provider Notes (Signed)
CSN: [id number]     Arrival date & time 06/16/13  2003 History   First MD Initiated Contact with Patient 06/16/13 2004     Chief Complaint  Patient presents with  . Fall  . Foot Injury     (Consider location/radiation/quality/duration/timing/severity/associated sxs/prior Treatment) HPI Pt on coumadin for her a.fib.  PCP: Dr. Alain Marion  She presents to the emergency department by EMS after a fall. She is on Coumadin. She reports trying to something was put in the wrong way and falling onto her bottom. There is significant open fracture to the right ankle with deformity and significant amount of bleeding. Preterm pain as a 6/10 and she has been given 100 mcg of fentanyl by EMS  upon arrival . He denies injury to her head.  Past Medical History  Diagnosis Date  . Paroxysmal atrial fibrillation      on coumadin therapy  . Hypertension   . LBP (low back pain)     severe lumbar spondylosis s/p L3/L4,L4/L5 fusion 12/10  . OA (osteoarthritis)     Dr. Cay Schillings  . Osteopenia   . Elevated MCV   . Constipation     ischemic colitis with rectal bleeding   . B12 deficiency     boderline  . Renal insufficiency   . Hypothyroidism 2011  . NSAID-associated gastropathy 2011    with bleed  . Iron deficiency anemia 2011    elevated MCV  . Peptic ulcer disease    Past Surgical History  Procedure Laterality Date  . Tonsillectomy    . Rotator cuff repair  1997    Left  . Back surgery  12/2008    L3/L4/L fusion  . Spine surgery    . Total hip arthroplasty  2012   Family History  Problem Relation Age of Onset  . Cancer Father   . Hypertension Other    History  Substance Use Topics  . Smoking status: Never Smoker   . Smokeless tobacco: Not on file  . Alcohol Use: 4.2 oz/week    7 Glasses of wine per week     Comment: 1 glass daily   OB History   Grav Para Term Preterm Abortions TAB SAB Ect Mult Living                 Review of Systems   Review of Systems  Gen: no weight  loss, fevers, chills, night sweats  Eyes: no discharge or drainage, no occular pain or visual changes  Nose: no epistaxis or rhinorrhea  Mouth: no dental pain, no sore throat  Neck: no neck pain  Lungs:No wheezing, coughing or hemoptysis CV: no chest pain, palpitations, dependent edema or orthopnea  Abd: no abdominal pain, nausea, vomiting, diarrhea GU: no dysuria or gross hematuria  MSK:  + open fracture to left ankle Neuro: no headache, no focal neurologic deficits  Skin: no rash or wounds Psyche: no complaints    Allergies  Atenolol; Clarithromycin; Codeine sulfate; Macrodantin; and Percocet  Home Medications   Prior to Admission medications   Medication Sig Start Date End Date Taking? Authorizing Provider  atorvastatin (LIPITOR) 10 MG tablet Take 10 mg by mouth every evening.   Yes Historical Provider, MD  B Complex-C (B-COMPLEX WITH VITAMIN C) tablet Take 1 tablet by mouth daily.   Yes Historical Provider, MD  Cholecalciferol (VITAMIN D3) 1000 UNITS tablet Take 1,000 Units by mouth daily.     Yes Historical Provider, MD  diltiazem (CARDIZEM CD) 120 MG 24 hr  capsule Take 1 capsule (120 mg total) by mouth daily. 12/23/12  Yes Burnell Blanks, MD  Ferrous Sulfate (IRON) 325 (65 FE) MG TABS Take 1 tablet by mouth daily.   Yes Historical Provider, MD  furosemide (LASIX) 40 MG tablet Take 40 mg by mouth 2 (two) times daily.   Yes Historical Provider, MD  metoprolol succinate (TOPROL-XL) 50 MG 24 hr tablet Take 1 tablet (50 mg total) by mouth 2 (two) times daily. Take with or immediately following a meal. 12/23/12  Yes Burnell Blanks, MD  pantoprazole (PROTONIX) 40 MG tablet Take 1 tablet (40 mg total) by mouth daily before breakfast. 01/19/13  Yes Milus Banister, MD  potassium chloride SA (K-DUR,KLOR-CON) 20 MEQ tablet Take 1 tablet (20 mEq total) by mouth daily. 12/22/12  Yes Aleksei [name] V, MD  vitamin C (ASCORBIC ACID) 500 MG tablet Take 500 mg by mouth daily. Take  with iron dose   Yes Historical Provider, MD  warfarin (COUMADIN) 5 MG tablet Take 2.5-5 mg by mouth See admin instructions. Take 2.5 mg by mouth each night, except on Monday and Friday take 5 mg by mouth at night.   Yes Historical Provider, MD  acetaminophen (TYLENOL) 500 MG tablet Take 500 mg by mouth daily as needed for pain.    Historical Provider, MD  clobetasol cream (TEMOVATE) 0.05 % As directed 03/31/11   Historical Provider, MD  hydrOXYzine (ATARAX) 25 MG tablet Take 25-50 mg by mouth 2 (two) times daily as needed. For itching     Historical Provider, MD  levothyroxine (SYNTHROID, LEVOTHROID) 25 MCG tablet Take 25 mcg by mouth daily before breakfast.    Historical Provider, MD  temazepam (RESTORIL) 30 MG capsule TAKE ONE CAPSULE AT BEDTIME AS NEEDED    Aleksei [name] V, MD  traMADol (ULTRAM) 50 MG tablet Take 1-2 tablets (50-100 mg total) by mouth 2 (two) times daily as needed. For pain 06/25/12   Aleksei [name] V, MD   BP 136/72  Pulse 87  Temp(Src) 98 F (36.7 C) (Oral)  Resp 27  SpO2 95% Physical Exam  Nursing note and vitals reviewed. Constitutional: She appears well-developed and well-nourished. No distress.  HENT:  Head: Normocephalic and atraumatic.  Eyes: Pupils are equal, round, and reactive to light.  Neck: Normal range of motion. Neck supple.  Cardiovascular: Normal rate and regular rhythm.   Pulmonary/Chest: Effort normal.  Abdominal: Soft.  Musculoskeletal:  Grossly deformed and open fracture of the left lower ankle. Wound is still oozing blood. Pt has pedal pulse, FROM of all 5 toes with intact sensation.  Neurological: She is alert.  Skin: Skin is warm and dry.      ED Course  Procedures (including critical care time) Labs Review Labs Reviewed  CBC WITH DIFFERENTIAL - Abnormal; Notable for the following:    MCV 107.8 (*)    MCH 36.5 (*)    Monocytes Relative 13 (*)    All other components within normal limits  PROTIME-INR - Abnormal; Notable for  the following:    Prothrombin Time 26.9 (*)    INR 2.59 (*)    All other components within normal limits  COMPREHENSIVE METABOLIC PANEL - Abnormal; Notable for the following:    Potassium 3.6 (*)    GFR calc non Af Amer 44 (*)    GFR calc Af Amer 52 (*)    All other components within normal limits    Imaging Review Dg Chest Portable 1 View  06/16/2013  CLINICAL DATA:  Leg pain post fall  EXAM: PORTABLE CHEST - 1 VIEW  COMPARISON:  Portable exam at 2102 hrs compared to 06/15/2012  FINDINGS: Enlargement of cardiac silhouette.  Mediastinal contours and pulmonary vascularity normal.  Numerous EKG leads project over chest particularly RIGHT middle lobe.  Chronic subsegmental atelectasis versus scarring in RIGHT middle lobe, superimposed with EKG leads, better visualized on previous exam.  No definite acute infiltrate, pleural effusion or pneumothorax.  Bones demineralized.  IMPRESSION: Enlargement of cardiac silhouette.  Chronic subsegmental atelectasis versus scarring in RIGHT middle lobe.   Electronically Signed   By: Lavonia Dana M.D.   On: 06/16/2013 21:35   Dg Tibia/fibula Left Port  06/16/2013   CLINICAL DATA:  Status post fall; left lower leg pain and open deformity.  EXAM: PORTABLE LEFT TIBIA AND FIBULA - 2 VIEW  COMPARISON:  None.  FINDINGS: There are comminuted fractures involving the distal tibial and fibular diaphyses, with one shaft width anterior and lateral displacement of the distal tibia, shortening at the fracture site, and more than one shaft width lateral displacement of the distal fibula. Scattered soft tissue air is seen, reflecting the open nature of the fracture, with marked associated soft tissue disruption.  The ankle mortise appears grossly intact. The distal interosseous space is grossly preserved.  There is also a mildly comminuted fracture involving the proximal fibular diaphysis, with associated soft tissue swelling. Given the degree of soft tissue injury, would follow up to  ensure that no compartment syndrome or rhabdomyolysis develops.  No significant knee joint effusion is identified. There is mild cortical irregularity along the articular surface of the patella. Scattered vascular calcifications are seen.  IMPRESSION: 1. Comminuted fractures involving the distal tibial and fibular diaphyses, with one shaft width anterior and lateral displacement of the distal tibia and shortening at the fracture site, and more than one shaft width lateral displacement of the distal fibula. This is an open fracture, with marked soft tissue disruption and scattered soft tissue air. 2. Mildly comminuted fracture involving the proximal fibular diaphysis, with associated soft tissue swelling. 3. Given the degree of soft tissue injury, would follow up to ensure that no compartment syndrome or rhabdomyolysis develops.   Electronically Signed   By: Garald Balding M.D.   On: 06/16/2013 21:35   Dg Foot 2 Views Left  06/16/2013   CLINICAL DATA:  Status post fall; left lower leg pain and open deformity.  EXAM: LEFT FOOT - 2 VIEW  COMPARISON:  None.  FINDINGS: The comminuted and displaced fractures through the distal diaphyses of the tibia and fibula are better characterized on concurrent tibia/fibula radiographs.  No additional fractures are seen. Visualized joint spaces are grossly preserved. The subtalar joint is unremarkable in appearance. Tiny plantar and posterior calcaneal spurs are noted.  Marked soft tissue disruption is noted about the distal lower leg, with prominent soft tissue air.  IMPRESSION: 1. Comminuted displaced fractures through the distal diaphyses of the tibia and fibula are better characterized on concurrent tibia/fibula radiographs. 2. No additional fractures seen. 3. Marked soft tissue disruption about the distal lower leg, with prominent soft tissue air.   Electronically Signed   By: Garald Balding M.D.   On: 06/16/2013 21:37     EKG Interpretation None      MDM   Final  diagnoses:  Open fracture of tibia and fibula    Dr. Alphonzo Severance here to see patient. Has evaluated her and will take to surgery. Medically speaking the  patient has not had any other complaints aside from her left leg. When she fell she landed onto her bottom and did not fall back. Her chest xray shows chronic non acute findings. Grossly open fracture. Patient admitted by Dr. Shirlean Schlein, PA-C 06/16/13 [phone]

## 2013-06-16 NOTE — ED Notes (Signed)
Dr. Aline Brochure and Tiffany at the bedside.

## 2013-06-16 NOTE — ED Notes (Signed)
Xray Technician at the bedside.

## 2013-06-16 NOTE — Anesthesia Procedure Notes (Signed)
Procedure Name: Intubation Date/Time: 06/16/2013 11:05 PM Performed by: Valetta Fuller Pre-anesthesia Checklist: Patient identified, Emergency Drugs available, Suction available and Patient being monitored Patient Re-evaluated:Patient Re-evaluated prior to inductionOxygen Delivery Method: Circle system utilized Preoxygenation: Pre-oxygenation with 100% oxygen Intubation Type: IV induction, Rapid sequence and Cricoid Pressure applied Laryngoscope Size: Miller and 2 Grade View: Grade I Tube type: Oral Tube size: 7.5 mm Number of attempts: 1 Airway Equipment and Method: Stylet Placement Confirmation: ETT inserted through vocal cords under direct vision,  positive ETCO2 and breath sounds checked- equal and bilateral Secured at: 23 cm Tube secured with: Tape Dental Injury: Teeth and Oropharynx as per pre-operative assessment

## 2013-06-16 NOTE — ED Notes (Signed)
Orthopedic Surgeon at the bedside.  

## 2013-06-16 NOTE — Anesthesia Preprocedure Evaluation (Addendum)
Anesthesia Evaluation  Patient identified by MRN, date of birth, ID band Patient awake    Reviewed: Allergy & Precautions, H&P , NPO status , Patient's Chart, lab work & pertinent test results  Airway Mallampati: II TM Distance: >3 FB Neck ROM: Full    Dental  (+) Teeth Intact, Dental Advisory Given   Pulmonary shortness of breath,  breath sounds clear to auscultation        Cardiovascular hypertension, + Peripheral Vascular Disease Rhythm:Regular Rate:Normal     Neuro/Psych    GI/Hepatic Neg liver ROS, PUD,   Endo/Other  Hypothyroidism   Renal/GU Renal disease     Musculoskeletal   Abdominal   Peds  Hematology   Anesthesia Other Findings   Reproductive/Obstetrics                         Anesthesia Physical Anesthesia Plan  ASA: III and emergent  Anesthesia Plan: General   Post-op Pain Management:    Induction: Intravenous, Rapid sequence and Cricoid pressure planned  Airway Management Planned: Oral ETT  Additional Equipment:   Intra-op Plan:   Post-operative Plan: Possible Post-op intubation/ventilation  Informed Consent:   Plan Discussed with: CRNA and Anesthesiologist  Anesthesia Plan Comments:        Anesthesia Quick Evaluation

## 2013-06-16 NOTE — H&P (Signed)
[name] is an 80 y.o. female.   Chief Complaint: Left leg pain HPI: [name] is an 80 year old patient with left leg pain. She is a household and later with walker who fell tonight in her kitchen. She sustained significant left leg injury and was unable to weight-bear. She is brought to the emergency room where open fracture was diagnosed radiographs subsequently did demonstrate an open distal tib-fib fracture. Antibiotics have been given. The patient does have a complicated past medical history which includes Coumadin use for chronic atrial fibrillation among others. She denies any other orthopedic complaints or loss of consciousness.  Past Medical History  Diagnosis Date  . Paroxysmal atrial fibrillation      on coumadin therapy  . Hypertension   . LBP (low back pain)     severe lumbar spondylosis s/p L3/L4,L4/L5 fusion 12/10  . OA (osteoarthritis)     Dr. Cay Schillings  . Osteopenia   . Elevated MCV   . Constipation     ischemic colitis with rectal bleeding   . B12 deficiency     boderline  . Renal insufficiency   . Hypothyroidism 2011  . NSAID-associated gastropathy 2011    with bleed  . Iron deficiency anemia 2011    elevated MCV  . Peptic ulcer disease     Past Surgical History  Procedure Laterality Date  . Tonsillectomy    . Rotator cuff repair  1997    Left  . Back surgery  12/2008    L3/L4/L fusion  . Spine surgery    . Total hip arthroplasty  2012    Family History  Problem Relation Age of Onset  . Cancer Father   . Hypertension Other    Social History:  reports that she has never smoked. She does not have any smokeless tobacco history on file. She reports that she drinks about 4.2 ounces of alcohol per week. She reports that she does not use illicit drugs.  Allergies:  Allergies  Allergen Reactions  . Atenolol     REACTION: sick  . Clarithromycin     REACTION: nausea  . Codeine Sulfate     REACTION: nausea  . Macrodantin Other (See Comments)    Long  time ago - reaction unknown  . Percocet [Oxycodone-Acetaminophen] Nausea And Vomiting     (Not in a hospital admission)  Results for orders placed during the hospital encounter of 06/16/13 (from the past 48 hour(s))  CBC WITH DIFFERENTIAL     Status: Abnormal   Collection Time    06/16/13  8:33 PM      Result Value Ref Range   WBC 6.5  4.0 - 10.5 K/uL   RBC 3.95  3.87 - 5.11 MIL/uL   Hemoglobin 14.4  12.0 - 15.0 g/dL   HCT 42.6  36.0 - 46.0 %   MCV 107.8 (*) 78.0 - 100.0 fL   MCH 36.5 (*) 26.0 - 34.0 pg   MCHC 33.8  30.0 - 36.0 g/dL   RDW 12.7  11.5 - 15.5 %   Platelets 203  150 - 400 K/uL   Neutrophils Relative % 60  43 - 77 %   Neutro Abs 4.0  1.7 - 7.7 K/uL   Lymphocytes Relative 24  12 - 46 %   Lymphs Abs 1.5  0.7 - 4.0 K/uL   Monocytes Relative 13 (*) 3 - 12 %   Monocytes Absolute 0.9  0.1 - 1.0 K/uL   Eosinophils Relative 2  0 - 5 %  Eosinophils Absolute 0.1  0.0 - 0.7 K/uL   Basophils Relative 1  0 - 1 %   Basophils Absolute 0.0  0.0 - 0.1 K/uL  PROTIME-INR     Status: Abnormal   Collection Time    06/16/13  8:33 PM      Result Value Ref Range   Prothrombin Time 26.9 (*) 11.6 - 15.2 seconds   INR 2.59 (*) 0.00 - 1.49  COMPREHENSIVE METABOLIC PANEL     Status: Abnormal   Collection Time    06/16/13  8:33 PM      Result Value Ref Range   Sodium 139  137 - 147 mEq/L   Potassium 3.6 (*) 3.7 - 5.3 mEq/L   Chloride 101  96 - 112 mEq/L   CO2 23  19 - 32 mEq/L   Glucose, Bld 88  70 - 99 mg/dL   BUN 13  6 - 23 mg/dL   Creatinine, Ser 1.10  0.50 - 1.10 mg/dL   Calcium 8.8  8.4 - 10.5 mg/dL   Total Protein 7.4  6.0 - 8.3 g/dL   Albumin 3.7  3.5 - 5.2 g/dL   AST 26  0 - 37 U/L   ALT 23  0 - 35 U/L   Alkaline Phosphatase 90  39 - 117 U/L   Total Bilirubin 0.8  0.3 - 1.2 mg/dL   GFR calc non Af Amer 44 (*) >90 mL/min   GFR calc Af Amer 52 (*) >90 mL/min   Comment: (NOTE)     The eGFR has been calculated using the CKD EPI equation.     This calculation has not been  validated in all clinical situations.     eGFR's persistently <90 mL/min signify possible Chronic Kidney     Disease.   Dg Chest Portable 1 View  06/16/2013   CLINICAL DATA:  Leg pain post fall  EXAM: PORTABLE CHEST - 1 VIEW  COMPARISON:  Portable exam at 2102 hrs compared to 06/15/2012  FINDINGS: Enlargement of cardiac silhouette.  Mediastinal contours and pulmonary vascularity normal.  Numerous EKG leads project over chest particularly RIGHT middle lobe.  Chronic subsegmental atelectasis versus scarring in RIGHT middle lobe, superimposed with EKG leads, better visualized on previous exam.  No definite acute infiltrate, pleural effusion or pneumothorax.  Bones demineralized.  IMPRESSION: Enlargement of cardiac silhouette.  Chronic subsegmental atelectasis versus scarring in RIGHT middle lobe.   Electronically Signed   By: Lavonia Dana M.D.   On: 06/16/2013 21:35   Dg Tibia/fibula Left Port  06/16/2013   CLINICAL DATA:  Status post fall; left lower leg pain and open deformity.  EXAM: PORTABLE LEFT TIBIA AND FIBULA - 2 VIEW  COMPARISON:  None.  FINDINGS: There are comminuted fractures involving the distal tibial and fibular diaphyses, with one shaft width anterior and lateral displacement of the distal tibia, shortening at the fracture site, and more than one shaft width lateral displacement of the distal fibula. Scattered soft tissue air is seen, reflecting the open nature of the fracture, with marked associated soft tissue disruption.  The ankle mortise appears grossly intact. The distal interosseous space is grossly preserved.  There is also a mildly comminuted fracture involving the proximal fibular diaphysis, with associated soft tissue swelling. Given the degree of soft tissue injury, would follow up to ensure that no compartment syndrome or rhabdomyolysis develops.  No significant knee joint effusion is identified. There is mild cortical irregularity along the articular surface of the patella. Scattered  vascular calcifications are seen.  IMPRESSION: 1. Comminuted fractures involving the distal tibial and fibular diaphyses, with one shaft width anterior and lateral displacement of the distal tibia and shortening at the fracture site, and more than one shaft width lateral displacement of the distal fibula. This is an open fracture, with marked soft tissue disruption and scattered soft tissue air. 2. Mildly comminuted fracture involving the proximal fibular diaphysis, with associated soft tissue swelling. 3. Given the degree of soft tissue injury, would follow up to ensure that no compartment syndrome or rhabdomyolysis develops.   Electronically Signed   By: Garald Balding M.D.   On: 06/16/2013 21:35   Dg Foot 2 Views Left  06/16/2013   CLINICAL DATA:  Status post fall; left lower leg pain and open deformity.  EXAM: LEFT FOOT - 2 VIEW  COMPARISON:  None.  FINDINGS: The comminuted and displaced fractures through the distal diaphyses of the tibia and fibula are better characterized on concurrent tibia/fibula radiographs.  No additional fractures are seen. Visualized joint spaces are grossly preserved. The subtalar joint is unremarkable in appearance. Tiny plantar and posterior calcaneal spurs are noted.  Marked soft tissue disruption is noted about the distal lower leg, with prominent soft tissue air.  IMPRESSION: 1. Comminuted displaced fractures through the distal diaphyses of the tibia and fibula are better characterized on concurrent tibia/fibula radiographs. 2. No additional fractures seen. 3. Marked soft tissue disruption about the distal lower leg, with prominent soft tissue air.   Electronically Signed   By: Garald Balding M.D.   On: 06/16/2013 21:37    Review of Systems  Constitutional: Negative.   HENT: Negative.   Eyes: Negative.   Respiratory: Negative.   Cardiovascular: Negative.   Gastrointestinal: Negative.   Genitourinary: Negative.   Musculoskeletal: Positive for joint pain.  Skin:  Negative.   Neurological: Negative.   Endo/Heme/Allergies: Negative.   Psychiatric/Behavioral: Negative.     Blood pressure 136/72, pulse 87, temperature 98 F (36.7 C), temperature source Oral, resp. rate 27, SpO2 95.00%. Physical Exam  Constitutional: She appears well-developed.  HENT:  Head: Normocephalic.  Eyes: Pupils are equal, round, and reactive to light.  Neck: Normal range of motion.  Cardiovascular: Normal rate.   Respiratory: Effort normal.  Neurological: She is alert.  Skin: Skin is warm.  Psychiatric: She has a normal mood and affect.   examination of the left lower extremity demonstrates of the foot is perfused past pulses trace palpable bilateral lower extremities ankle dorsi toe dorsiflexion plantarflexion intact sensation intact on the dorsum plantar aspect of the foot has a significant H-shaped incision/laceration on the anterior medial aspect of the left leg. Compartments are soft. Knee has no effusion bilaterally bilateral upper extremities demonstrate good range of motion the shoulders and elbows without bruising ecchymosis or crepitus. Right lower Ebony Hail he demonstrates no ecchymosis crepitus bruising in the ankle knee or hip joint.  Assessment/Plan Impression is grade 2 open tib-fib fracture distal with possible extension into the joint; however on plain radiographs this is not immediately apparent plan patient given IV antibiotics in the emergency room tonight patient is splinted. Her foot is perfused. This is a significant injury. Plan is for external fixation debridement and wound VAC placement tonight. Medical consultation will be obtained in addition the patient will likely have her Coumadin stopped tonight and be started on Lovenox for DVT prophylaxis and for atrial fibrillation. Subsequently this weekend the plan will be for intramedullary nailing and possible plate fixation of the fibula  fracture. The risk and benefits discussed with patient including but not  limited to infection nonhealing of the benign nonhealing of the soft tissue and need for more surgery as well as perioperative cardiac or stroke type event. Patient understands. Her daughter is an Optometrist and husband are also here. All questions answered.  Tonna Corner Dontasia Miranda 06/16/2013, 9:45 PM

## 2013-06-17 ENCOUNTER — Inpatient Hospital Stay (HOSPITAL_COMMUNITY): Payer: Medicare Other

## 2013-06-17 DIAGNOSIS — S82209B Unspecified fracture of shaft of unspecified tibia, initial encounter for open fracture type I or II: Secondary | ICD-10-CM | POA: Diagnosis present

## 2013-06-17 LAB — CBC
HCT: 39.5 % (ref 36.0–46.0)
Hemoglobin: 13.5 g/dL (ref 12.0–15.0)
MCH: 36.8 pg — ABNORMAL HIGH (ref 26.0–34.0)
MCHC: 34.2 g/dL (ref 30.0–36.0)
MCV: 107.6 fL — ABNORMAL HIGH (ref 78.0–100.0)
Platelets: 187 10*3/uL (ref 150–400)
RBC: 3.67 MIL/uL — ABNORMAL LOW (ref 3.87–5.11)
RDW: 12.7 % (ref 11.5–15.5)
WBC: 9.5 10*3/uL (ref 4.0–10.5)

## 2013-06-17 LAB — BASIC METABOLIC PANEL
BUN: 8 mg/dL (ref 6–23)
CO2: 26 mEq/L (ref 19–32)
Calcium: 8.4 mg/dL (ref 8.4–10.5)
Chloride: 97 mEq/L (ref 96–112)
Creatinine, Ser: 0.73 mg/dL (ref 0.50–1.10)
GFR calc Af Amer: 88 mL/min — ABNORMAL LOW (ref >90)
GFR calc non Af Amer: 76 mL/min — ABNORMAL LOW (ref >90)
Glucose, Bld: 137 mg/dL — ABNORMAL HIGH (ref 70–99)
Potassium: 3.9 mEq/L (ref 3.7–5.3)
Sodium: 137 mEq/L (ref 137–147)

## 2013-06-17 LAB — PROTIME-INR
INR: 2.39 — ABNORMAL HIGH (ref 0.00–1.49)
Prothrombin Time: 25.3 seconds — ABNORMAL HIGH (ref 11.6–15.2)

## 2013-06-17 MED ORDER — METOCLOPRAMIDE HCL 10 MG PO TABS: 5.0000 mg | ORAL_TABLET | Freq: Three times a day (TID) | ORAL | Status: DC | PRN

## 2013-06-17 MED ORDER — GLYCOPYRROLATE 0.2 MG/ML IJ SOLN
INTRAMUSCULAR | Status: DC | PRN
  Administered 2013-06-17: 0.6 mg via INTRAVENOUS

## 2013-06-17 MED ORDER — SODIUM CHLORIDE 0.9 % IR SOLN
Status: DC | PRN
  Administered 2013-06-17: 10000 mL

## 2013-06-17 MED ORDER — FENTANYL CITRATE 0.05 MG/ML IJ SOLN
INTRAMUSCULAR | Status: AC
  Filled 2013-06-17: qty 2

## 2013-06-17 MED ORDER — WARFARIN SODIUM 5 MG PO TABS
5.0000 mg | ORAL_TABLET | Freq: Once | ORAL | Status: DC
  Filled 2013-06-17: qty 1

## 2013-06-17 MED ORDER — WARFARIN - PHARMACIST DOSING INPATIENT: Freq: Every day | Status: DC

## 2013-06-17 MED ORDER — B COMPLEX-C PO TABS
1.0000 | ORAL_TABLET | Freq: Every day | ORAL | Status: DC
  Administered 2013-06-18 – 2013-06-25 (×8): 1 via ORAL
  Filled 2013-06-17 (×9): qty 1

## 2013-06-17 MED ORDER — BIOTENE DRY MOUTH MT LIQD
15.0000 mL | Freq: Two times a day (BID) | OROMUCOSAL | Status: DC
  Administered 2013-06-17 – 2013-06-23 (×13): 15 mL via OROMUCOSAL

## 2013-06-17 MED ORDER — FERROUS SULFATE 325 (65 FE) MG PO TABS
325.0000 mg | ORAL_TABLET | Freq: Every day | ORAL | Status: DC
  Administered 2013-06-17 – 2013-06-25 (×9): 325 mg via ORAL
  Filled 2013-06-17 (×10): qty 1

## 2013-06-17 MED ORDER — ENOXAPARIN SODIUM 40 MG/0.4ML ~~LOC~~ SOLN: 40.0000 mg | SUBCUTANEOUS | Status: DC

## 2013-06-17 MED ORDER — METOCLOPRAMIDE HCL 5 MG/ML IJ SOLN: 5.0000 mg | Freq: Three times a day (TID) | INTRAMUSCULAR | Status: DC | PRN

## 2013-06-17 MED ORDER — DOCUSATE SODIUM 100 MG PO CAPS
100.0000 mg | ORAL_CAPSULE | Freq: Two times a day (BID) | ORAL | Status: DC
  Administered 2013-06-17 – 2013-06-25 (×15): 100 mg via ORAL
  Filled 2013-06-17 (×20): qty 1

## 2013-06-17 MED ORDER — ONDANSETRON HCL 4 MG PO TABS: 4.0000 mg | ORAL_TABLET | Freq: Four times a day (QID) | ORAL | Status: DC | PRN

## 2013-06-17 MED ORDER — FUROSEMIDE 40 MG PO TABS
40.0000 mg | ORAL_TABLET | Freq: Two times a day (BID) | ORAL | Status: DC
  Administered 2013-06-17 – 2013-06-25 (×16): 40 mg via ORAL
  Filled 2013-06-17 (×21): qty 1

## 2013-06-17 MED ORDER — ONDANSETRON HCL 4 MG/2ML IJ SOLN
4.0000 mg | Freq: Four times a day (QID) | INTRAMUSCULAR | Status: DC | PRN
  Administered 2013-06-17 (×2): 4 mg via INTRAVENOUS
  Filled 2013-06-17 (×2): qty 2

## 2013-06-17 MED ORDER — FENTANYL CITRATE 0.05 MG/ML IJ SOLN
25.0000 ug | INTRAMUSCULAR | Status: DC | PRN
  Administered 2013-06-17 (×3): 50 ug via INTRAVENOUS

## 2013-06-17 MED ORDER — HYDROCODONE-ACETAMINOPHEN 10-325 MG PO TABS
1.0000 | ORAL_TABLET | ORAL | Status: DC | PRN
  Administered 2013-06-18: 1 via ORAL
  Administered 2013-06-24: 2 via ORAL
  Filled 2013-06-17: qty 1
  Filled 2013-06-17: qty 2

## 2013-06-17 MED ORDER — METOPROLOL TARTRATE 1 MG/ML IV SOLN
5.0000 mg | INTRAVENOUS | Status: DC | PRN
  Administered 2013-06-17: 5 mg via INTRAVENOUS
  Filled 2013-06-17: qty 5

## 2013-06-17 MED ORDER — MUPIROCIN CALCIUM 2 % EX CREA
TOPICAL_CREAM | CUTANEOUS | Status: DC | PRN
  Administered 2013-06-17: 1 via TOPICAL

## 2013-06-17 MED ORDER — ENOXAPARIN SODIUM 40 MG/0.4ML ~~LOC~~ SOLN
40.0000 mg | SUBCUTANEOUS | Status: DC
  Administered 2013-06-18 – 2013-06-20 (×3): 40 mg via SUBCUTANEOUS
  Filled 2013-06-17 (×3): qty 0.4

## 2013-06-17 MED ORDER — CEFAZOLIN SODIUM-DEXTROSE 2-3 GM-% IV SOLR
2.0000 g | Freq: Four times a day (QID) | INTRAVENOUS | Status: AC
  Administered 2013-06-17 – 2013-06-19 (×12): 2 g via INTRAVENOUS
  Filled 2013-06-17 (×13): qty 50

## 2013-06-17 MED ORDER — ONDANSETRON HCL 4 MG/2ML IJ SOLN
INTRAMUSCULAR | Status: DC | PRN
  Administered 2013-06-17: 4 mg via INTRAVENOUS

## 2013-06-17 MED ORDER — NEOSTIGMINE METHYLSULFATE 10 MG/10ML IV SOLN
INTRAVENOUS | Status: DC | PRN
  Administered 2013-06-17: 4 mg via INTRAVENOUS

## 2013-06-17 NOTE — Evaluation (Signed)
Occupational Therapy Evaluation Patient Details Name: [name] MRN: [id number] DOB: 23-Mar-1925 Today's Date: 06/17/2013    History of Present Illness 80 y.o. female admitted with an open distal tib-fib fracture, s/p external fixation.    Clinical Impression   Pt admitted with left open distal tib-fib fx, s/p external fixation, wound VAC.  Pt will benefit from acute OT services to address below problem list. Recommend SNF for d/c planning.    Follow Up Recommendations  SNF;Supervision/Assistance - 24 hour    Equipment Recommendations  None recommended by OT    Recommendations for Other Services       Precautions / Restrictions Precautions Precautions: Fall Restrictions Weight Bearing Restrictions: Yes LLE Weight Bearing: Non weight bearing Other Position/Activity Restrictions: external fixation LLE      Mobility Bed Mobility Overal bed mobility: +2 for physical assistance;Needs Assistance Bed Mobility: Supine to Sit     Supine to sit: +2 for physical assistance;+2 for safety/equipment;Mod assist     General bed mobility comments: assist to support LLE, raise trunk, cues for hand placement/technique, assist to manage VAC, IV, catheter  Transfers Overall transfer level: Needs assistance Equipment used: None Transfers: Stand Pivot Transfers   Stand pivot transfers: +2 physical assistance;+2 safety/equipment;From elevated surface;Mod assist       General transfer comment: assist to rise and to pivot    Balance Overall balance assessment: Needs assistance Sitting-balance support: Single extremity supported;Feet supported Sitting balance-Leahy Scale: Fair       Standing balance-Leahy Scale: Poor                              ADL Overall ADL's : Needs assistance/impaired     Grooming: Wash/dry face;Set up;Bed level   Upper Body Bathing: Supervision/ safety;Sitting   Lower Body Bathing: +2 for physical assistance;Bed level;Moderate  assistance   Upper Body Dressing : Supervision/safety;Sitting   Lower Body Dressing: +2 for physical assistance;Bed level;Moderate assistance               Functional mobility during ADLs: +2 for physical assistance;Moderate assistance General ADL Comments: Pt feeling "shakey' while sitting EOB.  Incr time for all tasks.     Vision                     Perception     Praxis      Pertinent Vitals/Pain See vitals tab     Hand Dominance     Extremity/Trunk Assessment Upper Extremity Assessment Upper Extremity Assessment: Overall WFL for tasks assessed   Lower Extremity Assessment Lower Extremity Assessment: LLE deficits/detail LLE Deficits / Details: SLR -3/5, can wiggle toes LLE: Unable to fully assess due to pain   Cervical / Trunk Assessment Cervical / Trunk Assessment: Normal   Communication Communication Communication: HOH   Cognition Arousal/Alertness: Lethargic;Suspect due to medications Behavior During Therapy: [facility] for tasks assessed/performed Overall Cognitive Status: Within Functional Limits for tasks assessed                     General Comments       Exercises       Shoulder Instructions      Home Living Family/patient expects to be discharged to:: Private residence Living Arrangements: Spouse/significant other Available Help at Discharge: Family;Available 24 hours/day Type of Home: House Home Access: Ramped entrance           Bathroom Shower/Tub: Occupational psychologist:  Standard     Home Equipment: Walker - 2 wheels;Bedside commode;Cane - single point;Shower seat - built in   Additional Comments: Planning to move to [place] living facility next week.      Prior Functioning/Environment Level of Independence: Independent with assistive device(s)             OT Diagnosis: Generalized weakness;Acute pain   OT Problem List: Decreased strength;Decreased activity tolerance;Impaired  balance (sitting and/or standing);Decreased knowledge of use of DME or AE;Decreased knowledge of precautions;Pain   OT Treatment/Interventions: Self-care/ADL training;DME and/or AE instruction;Therapeutic activities;Patient/family education;Balance training    OT Goals(Current goals can be found in the care plan section) Acute Rehab OT Goals Patient Stated Goal: to walk OT Goal Formulation: With patient/family Time For Goal Achievement: 07/01/13 Potential to Achieve Goals: Good  OT Frequency: Min 2X/week   Barriers to D/C:            Co-evaluation PT/OT/SLP Co-Evaluation/Treatment: Yes Reason for Co-Treatment: For patient/therapist safety;Complexity of the patient's impairments (multi-system involvement) PT goals addressed during session: Mobility/safety with mobility;Balance OT goals addressed during session: ADL's and self-care (functional mobility)      End of Session Equipment Utilized During Treatment: Gait belt Nurse Communication: Mobility status  Activity Tolerance: Patient limited by fatigue Patient left: in chair;with call bell/phone within reach;with family/visitor present   Time: 4315-4008 OT Time Calculation (min): 32 min Charges:  OT General Charges $OT Visit: 1 Procedure OT Evaluation $Initial OT Evaluation Tier I: 1 Procedure OT Treatments $Self Care/Home Management : 8-22 mins G-Codes:    Luther Bradley 06/26/2013, 1:17 PM 2013-06-26 Luther Bradley OTR/L Pager [phone] Office [phone]

## 2013-06-17 NOTE — Progress Notes (Addendum)
Physical Therapy Treatment Patient Details Name: [name] MRN: [id number] DOB: [birth year] Today's Date: 06/17/2013    History of Present Illness 80 y.o. female admitted with an open distal tib-fib fracture, s/p external fixation.     PT Comments    *Assisted pt from recliner to bed. **  Follow Up Recommendations  SNF     Equipment Recommendations  Rolling walker with 5" wheels;Wheelchair (measurements PT) [facility] with elevating leg rests)    Recommendations for Other Services       Precautions / Restrictions Precautions Precautions: Fall Restrictions Weight Bearing Restrictions: Yes LLE Weight Bearing: Non weight bearing Other Position/Activity Restrictions: external fixation LLE    Mobility  Bed Mobility Overal bed mobility: +2 for physical assistance;Needs Assistance Bed Mobility: Sit to Supine     Supine to sit: +2 for physical assistance;+2 for safety/equipment;Mod assist     General bed mobility comments: assist to support LLE, cues for hand placement/technique, assist to manage VAC, IV, catheter  Transfers Overall transfer level: Needs assistance Equipment used: None Transfers: Stand Pivot Transfers   Stand pivot transfers: +2 physical assistance;+2 safety/equipment;From elevated surface;Mod assist       General transfer comment: assist to rise and to pivot  Ambulation/Gait                 Stairs            Wheelchair Mobility    Modified Rankin (Stroke Patients Only)       Balance Overall balance assessment: Needs assistance Sitting-balance support: Single extremity supported;Feet supported Sitting balance-Leahy Scale: Fair       Standing balance-Leahy Scale: Poor                      Cognition Arousal/Alertness: Lethargic;Suspect due to medications Behavior During Therapy: [facility] for tasks assessed/performed Overall Cognitive Status: Within Functional Limits for tasks assessed                       Exercises      General Comments        Pertinent Vitals/Pain *5/10 LLE premedicated**    Home Living Family/patient expects to be discharged to:: Private residence Living Arrangements: Spouse/significant other Available Help at Discharge: Family;Available 24 hours/day Type of Home: House Home Access: Ramped entrance     Home Equipment: Walker - 2 wheels;Bedside commode;Cane - single point;Shower seat - built in Additional Comments: Planning to move to [place] living facility next week.    Prior Function Level of Independence: Independent with assistive device(s)          PT Goals (current goals can now be found in the care plan section) Acute Rehab PT Goals Patient Stated Goal: to walk PT Goal Formulation: With patient/family Time For Goal Achievement: 07/01/13 Potential to Achieve Goals: Good Progress towards PT goals: Progressing toward goals    Frequency  Min 6X/week    PT Plan Current plan remains appropriate    Co-evaluation PT/OT/SLP Co-Evaluation/Treatment: Yes Reason for Co-Treatment: For patient/therapist safety;Complexity of the patient's impairments (multi-system involvement) PT goals addressed during session: Mobility/safety with mobility;Balance OT goals addressed during session: ADL's and self-care (functional mobility)     End of Session Equipment Utilized During Treatment: Gait belt Activity Tolerance: Patient limited by fatigue Patient left: with call bell/phone within reach;with family/visitor present;in bed     Time: 1350-1359 PT Time Calculation (min): 9 min  Charges:  $Therapeutic Activity: 8-22 mins  G CodesLucile Crater 06/17/2013, 2:39 PM [phone]

## 2013-06-17 NOTE — Progress Notes (Signed)
Pt arrive to floor in no s/s of distress. Pt A&Ox4. Pt complain of pain - previously medicated before arrival - will admin pain med when able. VS stable. Whiteboard updated. Callbell within reach. Will continue to monitor. VS applied. Report received from [place]. Abrasion on left arm. External fixation on left leg - wound vac noted - elevated with pillow per physician's order.

## 2013-06-17 NOTE — Progress Notes (Signed)
Portable paged for new bed = bed alarm not working on current bed.

## 2013-06-17 NOTE — Anesthesia Postprocedure Evaluation (Signed)
  Anesthesia Post-op Note  Patient: [name]  Procedure(s) Performed: Procedure(s): EXTERNAL FIXATION LEG (Left) IRRIGATION AND DEBRIDEMENT EXTREMITY (Left) APPLICATION OF WOUND VAC (Left)  Patient Location: PACU  Anesthesia Type:General  Level of Consciousness: awake  Airway and Oxygen Therapy: Patient Spontanous Breathing  Post-op Pain: mild  Post-op Assessment: Post-op Vital signs reviewed  Post-op Vital Signs: Reviewed  Last Vitals:  Filed Vitals:   06/17/13 0145  BP: 154/73  Pulse: 88  Temp:   Resp: 14    Complications: No apparent anesthesia complications

## 2013-06-17 NOTE — OR Nursing (Signed)
Dates for procedure start were not correct. Corrected in the chart 0712 06/17/13 [place]

## 2013-06-17 NOTE — Progress Notes (Addendum)
ANTICOAGULATION CONSULT NOTE - Initial Consult  Pharmacy Consult for LMWH VTE px dose to start Saturday 06/18/13 Indication: atrial fibrillation  Allergies  Allergen Reactions  . Atenolol     REACTION: sick  . Clarithromycin     REACTION: nausea  . Codeine Sulfate     REACTION: nausea  . Macrodantin Other (See Comments)    Long time ago - reaction unknown  . Percocet [Oxycodone-Acetaminophen] Nausea And Vomiting    Patient Measurements: Height: 5\' 3"  (160 cm) Weight: 187 lb (84.823 kg) IBW/kg (Calculated) : 52.4   Vital Signs: Temp: 98 F (36.7 C) (05/29 0801) Temp src: Oral (05/29 0801) BP: 137/86 mmHg (05/29 0801) Pulse Rate: 96 (05/29 0801)  Labs:  Recent Labs  06/16/13 2033 06/17/13 0803  HGB 14.4 13.5  HCT 42.6 39.5  PLT 203 187  LABPROT 26.9* 25.3*  INR 2.59* 2.39*  CREATININE 1.10 0.73  CKTOTAL 101  --     Estimated Creatinine Clearance: 53.1 ml/min (by C-G formula based on Cr of 0.73).   Medical History: Past Medical History  Diagnosis Date  . Paroxysmal atrial fibrillation      on coumadin therapy  . Hypertension   . LBP (low back pain)     severe lumbar spondylosis s/p L3/L4,L4/L5 fusion 12/10  . OA (osteoarthritis)     Dr. Cay Schillings  . Osteopenia   . Elevated MCV   . Constipation     ischemic colitis with rectal bleeding   . B12 deficiency     boderline  . Renal insufficiency   . Hypothyroidism 2011  . NSAID-associated gastropathy 2011    with bleed  . Iron deficiency anemia 2011    elevated MCV  . Peptic ulcer disease     Medications:  Prescriptions prior to admission  Medication Sig Dispense Refill  . acetaminophen (TYLENOL) 500 MG tablet Take 500 mg by mouth daily as needed for pain.      [name] atorvastatin (LIPITOR) 10 MG tablet Take 10 mg by mouth every evening.      . B Complex-C (B-COMPLEX WITH VITAMIN C) tablet Take 1 tablet by mouth daily.      . Cholecalciferol (VITAMIN D3) 1000 UNITS tablet Take 1,000 Units by mouth  daily.        . clobetasol cream (TEMOVATE) 1.60 % Apply 1 application topically 2 (two) times daily as needed (itching). As directed      . diltiazem (CARDIZEM CD) 120 MG 24 hr capsule Take 1 capsule (120 mg total) by mouth daily.  90 capsule  1  . Ferrous Sulfate (IRON) 325 (65 FE) MG TABS Take 1 tablet by mouth daily.      . furosemide (LASIX) 40 MG tablet Take 40 mg by mouth 2 (two) times daily.      [name] levothyroxine (SYNTHROID, LEVOTHROID) 25 MCG tablet Take 25 mcg by mouth daily before breakfast.      . metoprolol succinate (TOPROL-XL) 50 MG 24 hr tablet Take 1 tablet (50 mg total) by mouth 2 (two) times daily. Take with or immediately following a meal.  180 tablet  1  . pantoprazole (PROTONIX) 40 MG tablet Take 1 tablet (40 mg total) by mouth daily before breakfast.  90 tablet  3  . potassium chloride SA (K-DUR,KLOR-CON) 20 MEQ tablet Take 1 tablet (20 mEq total) by mouth daily.  90 tablet  1  . temazepam (RESTORIL) 30 MG capsule TAKE ONE CAPSULE AT BEDTIME AS NEEDED  30 capsule  5  .  vitamin C (ASCORBIC ACID) 500 MG tablet Take 500 mg by mouth daily. Take with iron dose      . warfarin (COUMADIN) 5 MG tablet Take 2.5-5 mg by mouth See admin instructions. Take 2.5 mg by mouth each night, except on Monday and Friday take 5 mg by mouth at night.        Assessment: 80 yo F on coumadin PTA for afib.  Now s/p external fixation of leg, s/p I&D of extremity and application of wound vac for L open tib-fib fx. Her home coumadin dose is 2.5 mg each night except 5 mg on Monday and Friday nights.   Her last dose PTA was on 5/27, so she only missed 1 day of coumadin. Her admission INR was 2.59 and her INR this morning is 2.39.  Her CBC is WNL with no bleeding reported.  Vitamin K was ordered and discontinued, so NO vitamin K was given.  Pharmacy was consulted to resume coumadin by IM but Dr. Marlou Sa wants the option of going back to the OR next week. Per Dr. Marlou Sa:  "Subsequently this weekend the plan will be  for intramedullary nailing and possible plate fixation of the fibula fracture".  I spoke with Dr. Marlou Sa and informed him of the INR of 2.39 today.  He wishes to start [place] on Saturday 5/30 at a VTE prophylactic dose.    Plan:  1. LMWH 40 q24 to start 10/30 am 2. Daily INR for now 3. F/u to resume home coumadin once surgery complete  Eudelia Bunch, Pharm.D. [phone] 06/17/2013 9:57 AM

## 2013-06-17 NOTE — Progress Notes (Signed)
Consult Progress Note                                   Patient Demographics  [name], is a 80 y.o. female, DOB - 04/20/1925, TMH:[id number]  Admit date - 06/16/2013   Admitting Physician Meredith Pel, MD  Outpatient Primary MD for the patient is Walker Kehr, MD  LOS - 1   Chief Complaint  Patient presents with  . Fall  . Foot Injury           Subjective:   [name] today has, No headache, No chest pain, No abdominal pain - No Nausea, No new weakness tingling or numbness, No Cough - SOB.      Assessment & Plan    1.A.Fib - goal will be rate control, patient is on beta blocker which will be continued along with Cardizem at home dose, as needed IV Lopressor has been ordered, she is on Coumadin monitored by pharmacy and INR is over 2. Monitor on telemetry no acute issues.     2. Hypertension. Blood pressure is stable, continue beta blocker Cardizem combination.     3. Hypothyroidism. Home dose Synthroid to be continued unchanged.     4. Dyslipidemia. On Lipitor 10 mg continue.     5. Chronic diastolic dysfunction. EF 60% on echogram in 2012. Compensated. Continue home dose Lasix, discontinue fluids once eating. Monitor clinically     6. Mechanical fall with left Open tibial fracture - primary team which is orthopedics.      Code Status: Full   Medications  Scheduled Meds: . antiseptic oral rinse  15 mL Mouth Rinse BID  . atorvastatin  10 mg Oral QPM  . B-complex with vitamin C  1 tablet Oral Daily  .  ceFAZolin (ANCEF) IV  2 g Intravenous Q6H  . cholecalciferol  1,000 Units Oral Daily  . diltiazem  120 mg Oral Daily  . docusate sodium  100 mg Oral BID  . fentaNYL      . fentaNYL      . ferrous sulfate  325 mg Oral Q breakfast    . furosemide  40 mg Oral BID  . levothyroxine  25 mcg Oral QAC breakfast  . metoprolol succinate  50 mg Oral BID  . pantoprazole  40 mg Oral QAC breakfast  . potassium chloride SA  20 mEq Oral Daily  . vitamin C  500 mg Oral Daily  . warfarin  5 mg Oral ONCE-1800  . Warfarin - Pharmacist Dosing Inpatient   Does not apply q1800   Continuous Infusions: . sodium chloride 50 mL/hr at 06/17/13 0219   PRN Meds:.HYDROcodone-acetaminophen, morphine injection, ondansetron (ZOFRAN) IV, temazepam  DVT Prophylaxis  Coumadin  Lab Results  Component Value Date   INR 2.39* 06/17/2013   INR 2.59* 06/16/2013   INR 2.3 06/09/2013     Lab Results  Component Value Date   PLT 187 06/17/2013    Antibiotics     Anti-infectives   Start     Dose/Rate Route Frequency Ordered Stop   06/17/13 0400  ceFAZolin (  ANCEF) IVPB 2 g/50 mL premix     2 g 100 mL/hr over 30 Minutes Intravenous Every 6 hours 06/17/13 0225 06/20/13 0359   06/16/13 2315  gentamicin (GARAMYCIN) injection 120 mg     120 mg Intramuscular  Once 06/16/13 2304 06/17/13 0001   06/16/13 2315  vancomycin (VANCOCIN) powder 500 mg     500 mg Other To Surgery 06/16/13 2304 06/17/13 0002   06/16/13 2100  ceFAZolin (ANCEF) IVPB 1 g/50 mL premix     1 g 100 mL/hr over 30 Minutes Intravenous  Once 06/16/13 2048 06/16/13 2206   06/16/13 2030  ceFAZolin (ANCEF) IVPB 1 g/50 mL premix     1 g 100 mL/hr over 30 Minutes Intravenous  Once 06/16/13 2030 06/16/13 2136          Objective:   Filed Vitals:   06/17/13 0322 06/17/13 0423 06/17/13 0522 06/17/13 0801  BP: 154/88 143/86 140/85 137/86  Pulse: 89 92 85 96  Temp: 97.4 F (36.3 C) 97.5 F (36.4 C) 97.5 F (36.4 C) 98 F (36.7 C)  TempSrc: Oral Oral Oral Oral  Resp: 22 22 22 16   Height:      Weight:      SpO2: 93% 95% 95% 95%    Wt Readings from Last 3 Encounters:  06/17/13 84.823 kg (187 lb)  06/17/13 84.823 kg (187 lb)  02/23/13 84.823 kg (187 lb)     Intake/Output  Summary (Last 24 hours) at 06/17/13 1005 Last data filed at 06/17/13 1002  Gross per 24 hour  Intake   1200 ml  Output   2650 ml  Net  -1450 ml     Physical Exam  Awake Alert, Oriented X 3, No new F.N deficits, Normal affect Fisher.AT,PERRAL Supple Neck,No JVD, No cervical lymphadenopathy appriciated.  Symmetrical Chest wall movement, Good air movement bilaterally, CTAB RRR,No Gallops,Rubs or new Murmurs, No Parasternal Heave +ve B.Sounds, Abd Soft, No tenderness, No organomegaly appriciated, No rebound - guarding or rigidity. No Cyanosis, Clubbing or edema, No new Rash or bruise , left foot in compressive bandage with a drain in place   Data Review   Micro Results No results found for this or any previous visit (from the past 240 hour(s)).  Radiology Reports Dg Tibia/fibula Left  06/17/2013   CLINICAL DATA:  External fixation of left ankle fracture.  EXAM: DG C-ARM 61-120 MIN; LEFT TIBIA AND FIBULA - 2 VIEW  COMPARISON:  Left tibia/ fibula radiographs performed 06/16/2013  FINDINGS: Three fluoroscopic C-arm images are provided from the OR. These demonstrate external fixation screws transfixing the distal tibial diaphysis just above the level of the fracture. Apparent packing material is noted at the fracture site. The comminuted tibial and fibular fractures are again seen. These demonstrate mildly improved alignment in comparison to the prior study.  IMPRESSION: Status post external fixation of distal tibial fracture. Comminuted tibial and fibular fractures demonstrate mildly improved alignment.   Electronically Signed   By: Garald Balding M.D.   On: 06/17/2013 03:00   Dg Chest Portable 1 View  06/16/2013   CLINICAL DATA:  Leg pain post fall  EXAM: PORTABLE CHEST - 1 VIEW  COMPARISON:  Portable exam at 2102 hrs compared to 06/15/2012  FINDINGS: Enlargement of cardiac silhouette.  Mediastinal contours and pulmonary vascularity normal.  Numerous EKG leads project over chest particularly  RIGHT middle lobe.  Chronic subsegmental atelectasis versus scarring in RIGHT middle lobe, superimposed with EKG leads, better visualized on previous exam.  No  definite acute infiltrate, pleural effusion or pneumothorax.  Bones demineralized.  IMPRESSION: Enlargement of cardiac silhouette.  Chronic subsegmental atelectasis versus scarring in RIGHT middle lobe.   Electronically Signed   By: Ulyses Southward M.D.   On: 06/16/2013 21:35   Dg Tibia/fibula Left Port  06/16/2013   CLINICAL DATA:  Status post fall; left lower leg pain and open deformity.  EXAM: PORTABLE LEFT TIBIA AND FIBULA - 2 VIEW  COMPARISON:  None.  FINDINGS: There are comminuted fractures involving the distal tibial and fibular diaphyses, with one shaft width anterior and lateral displacement of the distal tibia, shortening at the fracture site, and more than one shaft width lateral displacement of the distal fibula. Scattered soft tissue air is seen, reflecting the open nature of the fracture, with marked associated soft tissue disruption.  The ankle mortise appears grossly intact. The distal interosseous space is grossly preserved.  There is also a mildly comminuted fracture involving the proximal fibular diaphysis, with associated soft tissue swelling. Given the degree of soft tissue injury, would follow up to ensure that no compartment syndrome or rhabdomyolysis develops.  No significant knee joint effusion is identified. There is mild cortical irregularity along the articular surface of the patella. Scattered vascular calcifications are seen.  IMPRESSION: 1. Comminuted fractures involving the distal tibial and fibular diaphyses, with one shaft width anterior and lateral displacement of the distal tibia and shortening at the fracture site, and more than one shaft width lateral displacement of the distal fibula. This is an open fracture, with marked soft tissue disruption and scattered soft tissue air. 2. Mildly comminuted fracture involving the  proximal fibular diaphysis, with associated soft tissue swelling. 3. Given the degree of soft tissue injury, would follow up to ensure that no compartment syndrome or rhabdomyolysis develops.   Electronically Signed   By: Roanna Raider M.D.   On: 06/16/2013 21:35   Dg Ankle Left Port  06/17/2013   CLINICAL DATA:  LEFT ankle fracture post external fixation  EXAM: PORTABLE LEFT ANKLE - 2 VIEW  COMPARISON:  Intraoperative images 06/17/2013  FINDINGS: External fixation device has been applied.  Distal RIGHT tibial fracture identified displaced medially and slightly anteriorly.  Displaced fibular diaphyseal fracture identified displaced medially and slightly anteriorly.  Ankle joint alignment appears normal.  Bones appear slightly demineralized.  Wound VAC and antibiotic beads noted.  IMPRESSION: Postoperative changes from external fixation as above.   Electronically Signed   By: Ulyses Southward M.D.   On: 06/17/2013 03:05   Dg Foot 2 Views Left  06/16/2013   CLINICAL DATA:  Status post fall; left lower leg pain and open deformity.  EXAM: LEFT FOOT - 2 VIEW  COMPARISON:  None.  FINDINGS: The comminuted and displaced fractures through the distal diaphyses of the tibia and fibula are better characterized on concurrent tibia/fibula radiographs.  No additional fractures are seen. Visualized joint spaces are grossly preserved. The subtalar joint is unremarkable in appearance. Tiny plantar and posterior calcaneal spurs are noted.  Marked soft tissue disruption is noted about the distal lower leg, with prominent soft tissue air.  IMPRESSION: 1. Comminuted displaced fractures through the distal diaphyses of the tibia and fibula are better characterized on concurrent tibia/fibula radiographs. 2. No additional fractures seen. 3. Marked soft tissue disruption about the distal lower leg, with prominent soft tissue air.   Electronically Signed   By: Roanna Raider M.D.   On: 06/16/2013 21:37   Dg C-arm 1-60 Min  06/17/2013  CLINICAL DATA:  External fixation of left ankle fracture.  EXAM: DG C-ARM 61-120 MIN; LEFT TIBIA AND FIBULA - 2 VIEW  COMPARISON:  Left tibia/ fibula radiographs performed 06/16/2013  FINDINGS: Three fluoroscopic C-arm images are provided from the OR. These demonstrate external fixation screws transfixing the distal tibial diaphysis just above the level of the fracture. Apparent packing material is noted at the fracture site. The comminuted tibial and fibular fractures are again seen. These demonstrate mildly improved alignment in comparison to the prior study.  IMPRESSION: Status post external fixation of distal tibial fracture. Comminuted tibial and fibular fractures demonstrate mildly improved alignment.   Electronically Signed   By: Garald Balding M.D.   On: 06/17/2013 03:00    CBC  Recent Labs Lab 06/16/13 2033 06/17/13 0803  WBC 6.5 9.5  HGB 14.4 13.5  HCT 42.6 39.5  PLT 203 187  MCV 107.8* 107.6*  MCH 36.5* 36.8*  MCHC 33.8 34.2  RDW 12.7 12.7  LYMPHSABS 1.5  --   MONOABS 0.9  --   EOSABS 0.1  --   BASOSABS 0.0  --     Chemistries   Recent Labs Lab 06/16/13 2033 06/17/13 0803  NA 139 137  K 3.6* 3.9  CL 101 97  CO2 23 26  GLUCOSE 88 137*  BUN 13 8  CREATININE 1.10 0.73  CALCIUM 8.8 8.4  MG 1.8  --   AST 26  --   ALT 23  --   ALKPHOS 90  --   BILITOT 0.8  --    ------------------------------------------------------------------------------------------------------------------ estimated creatinine clearance is 53.1 ml/min (by C-G formula based on Cr of 0.73). ------------------------------------------------------------------------------------------------------------------ No results found for this basename: HGBA1C,  in the last 72 hours ------------------------------------------------------------------------------------------------------------------ No results found for this basename: CHOL, HDL, LDLCALC, TRIG, CHOLHDL, LDLDIRECT,  in the last 72  hours ------------------------------------------------------------------------------------------------------------------ No results found for this basename: TSH, T4TOTAL, FREET3, T3FREE, THYROIDAB,  in the last 72 hours ------------------------------------------------------------------------------------------------------------------ No results found for this basename: VITAMINB12, FOLATE, FERRITIN, TIBC, IRON, RETICCTPCT,  in the last 72 hours  Coagulation profile  Recent Labs Lab 06/16/13 2033 06/17/13 0803  INR 2.59* 2.39*    No results found for this basename: DDIMER,  in the last 72 hours  Cardiac Enzymes No results found for this basename: CK, CKMB, TROPONINI, MYOGLOBIN,  in the last 168 hours ------------------------------------------------------------------------------------------------------------------ No components found with this basename: POCBNP,      Time Spent in minutes   35   Thurnell Lose M.D on 06/17/2013 at 10:05 AM  Between 7am to 7pm - Pager - [phone]  After 7pm go to [email or web address] - password [email or web address]  And look for the night coverage person covering for me after hours  [facility] Office  [phone]   **Disclaimer: This note may have been dictated with voice recognition software. Similar sounding words can inadvertently be transcribed and this note may contain transcription errors which may not have been corrected upon publication of note.**

## 2013-06-17 NOTE — Evaluation (Signed)
Physical Therapy Evaluation Patient Details Name: [name] MRN: [id number] DOB: 01-10-1926 Today's Date: 06/17/2013   History of Present Illness  80 y.o. female admitted with an open distal tib-fib fracture, s/p external fixation.   Clinical Impression  *Pt admitted with open distal tib-fib fx, s/p external fixation, wound VAC**. Pt currently with functional limitations due to the deficits listed below (see PT Problem List).  Pt will benefit from skilled PT to increase their independence and safety with mobility to allow discharge to the venue listed below.   **    Follow Up Recommendations SNF    Equipment Recommendations  Rolling walker with 5" wheels;Wheelchair (measurements PT) [facility] with elevating leg rests)    Recommendations for Other Services       Precautions / Restrictions Precautions Precautions: Fall Restrictions Weight Bearing Restrictions: Yes LLE Weight Bearing: Non weight bearing Other Position/Activity Restrictions: external fixation LLE      Mobility  Bed Mobility Overal bed mobility: +2 for physical assistance;Needs Assistance Bed Mobility: Supine to Sit     Supine to sit: +2 for physical assistance;+2 for safety/equipment;Mod assist     General bed mobility comments: assist to support LLE, raise trunk, cues for hand placement/technique, assist to manage VAC, IV, catheter  Transfers Overall transfer level: Needs assistance Equipment used: None Transfers: Stand Pivot Transfers   Stand pivot transfers: +2 physical assistance;+2 safety/equipment;From elevated surface;Mod assist       General transfer comment: assist to rise and to pivot  Ambulation/Gait                Stairs            Wheelchair Mobility    Modified Rankin (Stroke Patients Only)       Balance Overall balance assessment: Needs assistance Sitting-balance support: Single extremity supported;Feet supported Sitting balance-Leahy Scale: Fair        Standing balance-Leahy Scale: Poor                               Pertinent Vitals/Pain **5/10 LLE at rest and with activity Premedicated for pain and nausea*    Home Living Family/patient expects to be discharged to:: Private residence Living Arrangements: Spouse/significant other Available Help at Discharge: Family;Available 24 hours/day Type of Home: House Home Access: Ramped entrance       Home Equipment: Walker - 2 wheels;Bedside commode;Cane - single point;Shower seat - built in Additional Comments: walk in shower, 3 in 1 over commode, pt and husband had been planning to move to [place] next week    Prior Function Level of Independence: Independent with assistive device(s)               Hand Dominance        Extremity/Trunk Assessment   Upper Extremity Assessment: Overall WFL for tasks assessed           Lower Extremity Assessment: LLE deficits/detail   LLE Deficits / Details: SLR -3/5, can wiggle toes  Cervical / Trunk Assessment: Normal  Communication   Communication: No difficulties  Cognition Arousal/Alertness: Lethargic;Suspect due to medications Behavior During Therapy: [facility] for tasks assessed/performed Overall Cognitive Status: Within Functional Limits for tasks assessed                      General Comments      Exercises        Assessment/Plan    PT Assessment Patient needs  continued PT services  PT Diagnosis Difficulty walking;Generalized weakness;Acute pain   PT Problem List Decreased strength;Decreased activity tolerance;Decreased balance;Decreased mobility;Pain;Decreased knowledge of precautions;Decreased knowledge of use of DME  PT Treatment Interventions DME instruction;Gait training;Functional mobility training;Therapeutic activities;Patient/family education;Therapeutic exercise   PT Goals (Current goals can be found in the Care Plan section) Acute Rehab PT Goals Patient Stated Goal: to walk PT  Goal Formulation: With patient/family Time For Goal Achievement: 07/01/13 Potential to Achieve Goals: Good    Frequency Min 6X/week   Barriers to discharge        Co-evaluation PT/OT/SLP Co-Evaluation/Treatment: Yes Reason for Co-Treatment: For patient/therapist safety;Complexity of the patient's impairments (multi-system involvement) PT goals addressed during session: Mobility/safety with mobility;Balance         End of Session Equipment Utilized During Treatment: Gait belt Activity Tolerance: Patient limited by fatigue Patient left: in chair;with call bell/phone within reach;with family/visitor present Nurse Communication: Mobility status         Time: 4665-9935 PT Time Calculation (min): 34 min   Charges:   PT Evaluation $Initial PT Evaluation Tier I: 1 Procedure PT Treatments $Therapeutic Activity: 8-22 mins   PT G CodesLucile Crater 06/17/2013, 12:59 PM [phone]

## 2013-06-17 NOTE — Brief Op Note (Signed)
06/16/2013 - 06/17/2013  12:55 AM  PATIENT:  [name]  80 y.o. female  PRE-OPERATIVE DIAGNOSIS:  Left Open Tib-Fib Fracture  POST-OPERATIVE DIAGNOSIS:  Left Open Tib-Fib Fracture  PROCEDURE:  Procedure(s): EXTERNAL FIXATION LEG IRRIGATION AND DEBRIDEMENT EXTREMITY APPLICATION OF WOUND VAC  SURGEON:  Surgeon(s): Meredith Pel, MD  ASSISTANT: none  ANESTHESIA:   general  EBL: 50 ml    Total I/O In: 200 [I.V.:200] Out: 600 [Urine:600]  BLOOD ADMINISTERED: none  DRAINS: wound vac   LOCAL MEDICATIONS USED:  none  SPECIMEN:  No Specimen  COUNTS:  YES  TOURNIQUET:  * No tourniquets in log *  DICTATION: .Other Dictation: Dictation Number [id number]  PLAN OF CARE: Admit to inpatient   PATIENT DISPOSITION:  PACU - hemodynamically stable

## 2013-06-17 NOTE — Progress Notes (Signed)
Subjective: Pt stable - pain controlled   Objective: Vital signs in last 24 hours: Temp:  [96.1 F (35.6 C)-98.1 F (36.7 C)] 98.1 F (36.7 C) (05/29 1150) Pulse Rate:  [75-107] 95 (05/29 1150) Resp:  [12-27] 20 (05/29 1150) BP: (121-159)/(72-96) 147/94 mmHg (05/29 1150) SpO2:  [91 %-98 %] 91 % (05/29 1150) Weight:  [84.823 kg (187 lb)] 84.823 kg (187 lb) (05/29 0223)  Intake/Output from previous day: 05/28 0701 - 05/29 0700 In: 1000 [I.V.:1000] Out: 850 [Urine:850] Intake/Output this shift: Total I/O In: 200 [P.O.:200] Out: 1800 [Urine:1800]  Exam:  Sensation intact distally Dorsiflexion/Plantar flexion intact Compartment soft  Labs:  Recent Labs  06/16/13 2033 06/17/13 0803  HGB 14.4 13.5    Recent Labs  06/16/13 2033 06/17/13 0803  WBC 6.5 9.5  RBC 3.95 3.67*  HCT 42.6 39.5  PLT 203 187    Recent Labs  06/16/13 2033 06/17/13 0803  NA 139 137  K 3.6* 3.9  CL 101 97  CO2 23 26  BUN 13 8  CREATININE 1.10 0.73  GLUCOSE 88 137*  CALCIUM 8.8 8.4    Recent Labs  06/16/13 2033 06/17/13 0803  INR 2.59* 2.39*    Assessment/Plan: Plan elavation - wound vac over weekend - psu consult for wound vac and coverage - possible im nail Tuesday - off coumadin on lovenox   [employer] 06/17/2013, 12:24 PM

## 2013-06-17 NOTE — Op Note (Signed)
[name], [name] NO.:  [id number]  MEDICAL RECORD NO.:  [id number]  LOCATION:  [facility]                         FACILITY:  [facility]  PHYSICIAN:  Anderson Malta, M.D.    DATE OF BIRTH:  1925/10/31  DATE OF PROCEDURE:  06/16/2013 DATE OF DISCHARGE:                              OPERATIVE REPORT   PREOPERATIVE DIAGNOSIS:  Open left tib-fib fracture.  POSTOPERATIVE DIAGNOSIS:  Open left tib-fib fracture.  PROCEDURES: 1. Excisional debridement of open fracture, left open tib-fib fracture     including skin, subcutaneous tissue, muscle fascia, and bone. 2. External fixation of open tib-fib fracture. 3. Application of wound VAC. 4. Application of antibiotic beads.  SURGEON:  Anderson Malta, M.D.  ASSISTANT:  None.  ANESTHESIA:  General.  INDICATIONS:  [name] is a patient who sustained a low energy fracture of her left tib-fib.  This resulted in a significant injury, open to the distal tib-fib region.  She presents now for operative management.  PROCEDURE IN DETAIL:  The patient was brought to the operating room where general endotracheal anesthesia was induced.  Perioperative IV antibiotics were maintained.  Left leg was prepped with Hibiclens and saline, draped in a sterile manner.  The patient had an 18-cm laceration, extending above the malleolus posteriorly to the level of the Achilles tendon anteriorly and laterally in an angle of 45 degrees for approximately 18 cm.  The foot was perfused.  Bone fragments were present.  This extensive open laceration was debrided with both the scalpel and a curette.  9 L of irrigating solution which was poured under low pressure was then used to irrigate the exposed bone ends and soft tissue.  Skin edges and devitalized tissue were debrided. Following thorough excisional irrigation and debridement, antibiotic beads were placed within the bone and then the fracture site.  External fixator was then placed by placing a  pin through the calcaneus under fluoroscopic guidance followed by two pins in the tibia again under fluoroscopic guidance.  Fracture was reduced.  The antibiotic beads were placed and skin was reapproximated loosely except in the middle of the incision which could not be reapproximated because of the poor tissue quality.  This left a gap which was filled with wound VAC, measuring about 5 x 2 cm.  Fracture was reasonably well reduced in the AP and lateral planes under fluoroscopy.  Bolts were tightened for the external fixator which was a Lawyer.  Bactroban cream applied to the pin sites, wound VAC sealed.  The patient wrapped in a dressing and tolerated the procedure well without immediate complication.  Transferred to the recovery room in stable condition.     Anderson Malta, M.D.     GSD/MEDQ  D:  06/17/2013  T:  06/17/2013  Job:  [id number]

## 2013-06-17 NOTE — Transfer of Care (Signed)
Immediate Anesthesia Transfer of Care Note  Patient: [name]  Procedure(s) Performed: Procedure(s): EXTERNAL FIXATION LEG (Left) IRRIGATION AND DEBRIDEMENT EXTREMITY (Left) APPLICATION OF WOUND VAC (Left)  Patient Location: PACU  Anesthesia Type:General  Level of Consciousness: awake, alert  and oriented  Airway & Oxygen Therapy: Patient Spontanous Breathing and Patient connected to face mask oxygen  Post-op Assessment: Report given to PACU RN and Post -op Vital signs reviewed and stable  Post vital signs: Reviewed and stable  Complications: No apparent anesthesia complications

## 2013-06-17 NOTE — Progress Notes (Signed)
On call physician notified of small amount of bleeding through the ace wrap dressing on pt's left heel.

## 2013-06-17 NOTE — ED Provider Notes (Signed)
Medical screening examination/treatment/procedure(s) were conducted as a shared visit with non-physician practitioner(s) and myself.  I personally evaluated the patient during the encounter.   EKG Interpretation   Date/Time:  Thursday Jun 16 2013 20:28:09 EDT Ventricular Rate:  87 PR Interval:    QRS Duration: 152 QT Interval:  391 QTC Calculation: 470 R Axis:   68 Text Interpretation:  Age not entered, assumed to be  80 years old for  purpose of ECG interpretation Atrial fibrillation Right bundle branch  block No significant change since last tracing Confirmed by Emillia Weatherly  MD,  Berniece Abid ([id number]) on 06/17/2013 12:12:02 AM      I interviewed and examined the patient. Lungs are CTAB. Cardiac exam wnl. Abdomen soft.  Gross deformity to left distal tib fib w/ open fracture and foot rotated to the left. Pt has normal rom of digits on left foot, normal cap refill, and 2+ DP pulse. I emergently reduced the ankle d/t active bleeding, applied compression dressing. Ortho called immediately prior to imaging d/t severity of wound. Will give 2g ancef, update tdap, pain control. Pt will go to OR d/t open comminuted tib/fib fracture.   CRITICAL CARE Performed by: Bina Veenstra Ruthe Mannan Total critical care time: 30 min Critical care time was exclusive of separately billable procedures and treating other patients. Critical care was necessary to treat or prevent imminent or life-threatening deterioration. Critical care was time spent personally by me on the following activities: development of treatment plan with patient and/or surrogate as well as nursing, discussions with consultants, evaluation of patient's response to treatment, examination of patient, obtaining history from patient or surrogate, ordering and performing treatments and interventions, ordering and review of laboratory studies, ordering and review of radiographic studies, pulse oximetry and re-evaluation of patient's condition.   Blanchard Kelch, MD 06/17/13 1002

## 2013-06-17 NOTE — Care Management Note (Signed)
    Page 1 of 1   06/20/2013     4:04:01 PM CARE MANAGEMENT NOTE 06/20/2013  Patient:  [name], [name]   Account Number:  [id number]  Date Initiated:  06/17/2013  Documentation initiated by:  Tomi Bamberger  Subjective/Objective Assessment:   dx fall ankle fx  admit- lives with spouse,     Action/Plan:   pt/ot eval- rec snf  6/2- plan for IM nail procedure   Anticipated DC Date:  06/22/2013   Anticipated DC Plan:  [place]  In-house referral  Clinical Social Worker      DC Planning Services  CM consult      Choice offered to / List presented to:             Status of service:  In process, will continue to follow Medicare Important Message given?  YES (If response is "NO", the following Medicare IM given date fields will be blank) Date Medicare IM given:  06/20/2013 Date Additional Medicare IM given:    Discharge Disposition:    Per UR Regulation:  Reviewed for med. necessity/level of care/duration of stay  If discussed at [place] of Stay Meetings, dates discussed:    Comments:  06/20/13 [place] RN,BSN [phone] patient lives with spouse, plan for IM Nail procedure on 6/2.

## 2013-06-18 DIAGNOSIS — I4891 Unspecified atrial fibrillation: Secondary | ICD-10-CM

## 2013-06-18 DIAGNOSIS — I10 Essential (primary) hypertension: Secondary | ICD-10-CM

## 2013-06-18 DIAGNOSIS — E039 Hypothyroidism, unspecified: Secondary | ICD-10-CM

## 2013-06-18 LAB — PROTIME-INR
INR: 1.86 — ABNORMAL HIGH (ref 0.00–1.49)
Prothrombin Time: 20.9 seconds — ABNORMAL HIGH (ref 11.6–15.2)

## 2013-06-18 MED ORDER — ACETAMINOPHEN 325 MG PO TABS
650.0000 mg | ORAL_TABLET | Freq: Four times a day (QID) | ORAL | Status: DC | PRN
Start: 2013-06-18 — End: 2013-06-25
  Administered 2013-06-19 – 2013-06-25 (×10): 650 mg via ORAL
  Filled 2013-06-18 (×10): qty 2

## 2013-06-18 NOTE — Progress Notes (Signed)
PATIENT DETAILS Name: [name] Age: 80 y.o. Sex: female Date of Birth: [birth year] Admit Date: 06/16/2013 Admitting Physician Meredith Pel, MD [id number] [name], MD  Subjective: No major complaints.  Assessment/Plan: A.Fib  - goal will be rate control, patient is on beta blocker which will be continued along with Cardizem at home dose, as needed IV Lopressor has been ordered, she was on Coumadin, however this has been held at the request of Orthopedics,as plans for possible IM nail Tuesday.    Hypothyroidism.  -Home dose Synthroid to be continued unchanged.   Dyslipidemia.  -On Lipitor 10 mg continue.   Chronic diastolic dysfunction - EF 60% on echogram in 2012. Compensated. Continue home dose Lasix, discontinue fluids once eating. Monitor clinically   Mechanical fall with left Open tibial fracture  -per primary primary team which is orthopedics.  Disposition: Remain inpatient  DVT Prophylaxis: Prophylactic Lovenox  Code Status: Full code   Family Communication Daughter at bedside  Procedures:  None  CONSULTS:  None  Time spent 40 minutes-which includes 50% of the time with face-to-face with patient/ family and coordinating care related to the above assessment and plan.    MEDICATIONS: Scheduled Meds: . antiseptic oral rinse  15 mL Mouth Rinse BID  . atorvastatin  10 mg Oral QPM  . B-complex with vitamin C  1 tablet Oral Daily  .  ceFAZolin (ANCEF) IV  2 g Intravenous Q6H  . cholecalciferol  1,000 Units Oral Daily  . diltiazem  120 mg Oral Daily  . docusate sodium  100 mg Oral BID  . enoxaparin (LOVENOX) injection  40 mg Subcutaneous Q24H  . ferrous sulfate  325 mg Oral Q breakfast  . furosemide  40 mg Oral BID  . levothyroxine  25 mcg Oral QAC breakfast  . metoprolol succinate  50 mg Oral BID  . pantoprazole  40 mg Oral QAC breakfast  . potassium chloride SA  20 mEq Oral Daily  . vitamin C  500 mg Oral Daily   Continuous  Infusions:  PRN Meds:.HYDROcodone-acetaminophen, metoprolol, morphine injection, ondansetron (ZOFRAN) IV, temazepam  Antibiotics: Anti-infectives   Start     Dose/Rate Route Frequency Ordered Stop   06/17/13 0400  ceFAZolin (ANCEF) IVPB 2 g/50 mL premix     2 g 100 mL/hr over 30 Minutes Intravenous Every 6 hours 06/17/13 0225 06/20/13 0359   06/16/13 2315  gentamicin (GARAMYCIN) injection 120 mg     120 mg Intramuscular  Once 06/16/13 2304 06/17/13 0001   06/16/13 2315  vancomycin (VANCOCIN) powder 500 mg     500 mg Other To Surgery 06/16/13 2304 06/17/13 0002   06/16/13 2100  ceFAZolin (ANCEF) IVPB 1 g/50 mL premix     1 g 100 mL/hr over 30 Minutes Intravenous  Once 06/16/13 2048 06/16/13 2206   06/16/13 2030  ceFAZolin (ANCEF) IVPB 1 g/50 mL premix     1 g 100 mL/hr over 30 Minutes Intravenous  Once 06/16/13 2030 06/16/13 2136       PHYSICAL EXAM: Vital signs in last 24 hours: Filed Vitals:   06/18/13 0547 06/18/13 0802 06/18/13 1017 06/18/13 1211  BP: 106/72 149/84 128/75 132/91  Pulse: 86 88 123 87  Temp: 98.3 F (36.8 C) 98.1 F (36.7 C)  98.7 F (37.1 C)  TempSrc: Oral Oral  Oral  Resp: 17 16  16   Height:      Weight:      SpO2: 97% 96%  95%    Weight change:  Filed Weights   06/17/13 0223  Weight: 84.823 kg (187 lb)   Body mass index is 33.13 kg/(m^2).   Gen Exam: Awake and alert with clear speech.   Neck: Supple, No JVD.   Chest: B/L Clear.   CVS: S1 S2 irregular, no murmurs.  Abdomen: soft, BS +, non tender, non distended.  Extremities:left foot in compressive bandage with a drain in place Neurologic: Non Focal.   Skin: No Rash.   Wounds: N/A.   Intake/Output from previous day:  Intake/Output Summary (Last 24 hours) at 06/18/13 1307 Last data filed at 06/18/13 1027  Gross per 24 hour  Intake    390 ml  Output   4050 ml  Net  -3660 ml     LAB RESULTS: CBC  Recent Labs Lab 06/16/13 2033 06/17/13 0803  WBC 6.5 9.5  HGB 14.4 13.5  HCT  42.6 39.5  PLT 203 187  MCV 107.8* 107.6*  MCH 36.5* 36.8*  MCHC 33.8 34.2  RDW 12.7 12.7  LYMPHSABS 1.5  --   MONOABS 0.9  --   EOSABS 0.1  --   BASOSABS 0.0  --     Chemistries   Recent Labs Lab 06/16/13 2033 06/17/13 0803  NA 139 137  K 3.6* 3.9  CL 101 97  CO2 23 26  GLUCOSE 88 137*  BUN 13 8  CREATININE 1.10 0.73  CALCIUM 8.8 8.4  MG 1.8  --     CBG: No results found for this basename: GLUCAP,  in the last 168 hours  GFR Estimated Creatinine Clearance: 53.1 ml/min (by C-G formula based on Cr of 0.73).  Coagulation profile  Recent Labs Lab 06/16/13 2033 06/17/13 0803 06/18/13 0637  INR 2.59* 2.39* 1.86*    Cardiac Enzymes No results found for this basename: CK, CKMB, TROPONINI, MYOGLOBIN,  in the last 168 hours  No components found with this basename: POCBNP,  No results found for this basename: DDIMER,  in the last 72 hours No results found for this basename: HGBA1C,  in the last 72 hours No results found for this basename: CHOL, HDL, LDLCALC, TRIG, CHOLHDL, LDLDIRECT,  in the last 72 hours No results found for this basename: TSH, T4TOTAL, FREET3, T3FREE, THYROIDAB,  in the last 72 hours No results found for this basename: VITAMINB12, FOLATE, FERRITIN, TIBC, IRON, RETICCTPCT,  in the last 72 hours No results found for this basename: LIPASE, AMYLASE,  in the last 72 hours  Urine Studies No results found for this basename: UACOL, UAPR, USPG, UPH, UTP, UGL, UKET, UBIL, UHGB, UNIT, UROB, ULEU, UEPI, UWBC, URBC, UBAC, CAST, CRYS, UCOM, BILUA,  in the last 72 hours  MICROBIOLOGY: No results found for this or any previous visit (from the past 240 hour(s)).  RADIOLOGY STUDIES/RESULTS: Dg Tibia/fibula Left  06/17/2013   CLINICAL DATA:  External fixation of left ankle fracture.  EXAM: DG C-ARM 61-120 MIN; LEFT TIBIA AND FIBULA - 2 VIEW  COMPARISON:  Left tibia/ fibula radiographs performed 06/16/2013  FINDINGS: Three fluoroscopic C-arm images are provided  from the OR. These demonstrate external fixation screws transfixing the distal tibial diaphysis just above the level of the fracture. Apparent packing material is noted at the fracture site. The comminuted tibial and fibular fractures are again seen. These demonstrate mildly improved alignment in comparison to the prior study.  IMPRESSION: Status post external fixation of distal tibial fracture. Comminuted tibial and fibular fractures demonstrate mildly improved alignment.   Electronically  Signed   By: Garald Balding M.D.   On: 06/17/2013 03:00   Dg Chest Portable 1 View  06/16/2013   CLINICAL DATA:  Leg pain post fall  EXAM: PORTABLE CHEST - 1 VIEW  COMPARISON:  Portable exam at 2102 hrs compared to 06/15/2012  FINDINGS: Enlargement of cardiac silhouette.  Mediastinal contours and pulmonary vascularity normal.  Numerous EKG leads project over chest particularly RIGHT middle lobe.  Chronic subsegmental atelectasis versus scarring in RIGHT middle lobe, superimposed with EKG leads, better visualized on previous exam.  No definite acute infiltrate, pleural effusion or pneumothorax.  Bones demineralized.  IMPRESSION: Enlargement of cardiac silhouette.  Chronic subsegmental atelectasis versus scarring in RIGHT middle lobe.   Electronically Signed   By: Lavonia Dana M.D.   On: 06/16/2013 21:35   Dg Tibia/fibula Left Port  06/16/2013   CLINICAL DATA:  Status post fall; left lower leg pain and open deformity.  EXAM: PORTABLE LEFT TIBIA AND FIBULA - 2 VIEW  COMPARISON:  None.  FINDINGS: There are comminuted fractures involving the distal tibial and fibular diaphyses, with one shaft width anterior and lateral displacement of the distal tibia, shortening at the fracture site, and more than one shaft width lateral displacement of the distal fibula. Scattered soft tissue air is seen, reflecting the open nature of the fracture, with marked associated soft tissue disruption.  The ankle mortise appears grossly intact. The  distal interosseous space is grossly preserved.  There is also a mildly comminuted fracture involving the proximal fibular diaphysis, with associated soft tissue swelling. Given the degree of soft tissue injury, would follow up to ensure that no compartment syndrome or rhabdomyolysis develops.  No significant knee joint effusion is identified. There is mild cortical irregularity along the articular surface of the patella. Scattered vascular calcifications are seen.  IMPRESSION: 1. Comminuted fractures involving the distal tibial and fibular diaphyses, with one shaft width anterior and lateral displacement of the distal tibia and shortening at the fracture site, and more than one shaft width lateral displacement of the distal fibula. This is an open fracture, with marked soft tissue disruption and scattered soft tissue air. 2. Mildly comminuted fracture involving the proximal fibular diaphysis, with associated soft tissue swelling. 3. Given the degree of soft tissue injury, would follow up to ensure that no compartment syndrome or rhabdomyolysis develops.   Electronically Signed   By: Garald Balding M.D.   On: 06/16/2013 21:35   Dg Ankle Left Port  06/17/2013   CLINICAL DATA:  LEFT ankle fracture post external fixation  EXAM: PORTABLE LEFT ANKLE - 2 VIEW  COMPARISON:  Intraoperative images 06/17/2013  FINDINGS: External fixation device has been applied.  Distal RIGHT tibial fracture identified displaced medially and slightly anteriorly.  Displaced fibular diaphyseal fracture identified displaced medially and slightly anteriorly.  Ankle joint alignment appears normal.  Bones appear slightly demineralized.  Wound VAC and antibiotic beads noted.  IMPRESSION: Postoperative changes from external fixation as above.   Electronically Signed   By: Lavonia Dana M.D.   On: 06/17/2013 03:05   Dg Foot 2 Views Left  06/16/2013   CLINICAL DATA:  Status post fall; left lower leg pain and open deformity.  EXAM: LEFT FOOT - 2  VIEW  COMPARISON:  None.  FINDINGS: The comminuted and displaced fractures through the distal diaphyses of the tibia and fibula are better characterized on concurrent tibia/fibula radiographs.  No additional fractures are seen. Visualized joint spaces are grossly preserved. The subtalar joint is unremarkable  in appearance. Tiny plantar and posterior calcaneal spurs are noted.  Marked soft tissue disruption is noted about the distal lower leg, with prominent soft tissue air.  IMPRESSION: 1. Comminuted displaced fractures through the distal diaphyses of the tibia and fibula are better characterized on concurrent tibia/fibula radiographs. 2. No additional fractures seen. 3. Marked soft tissue disruption about the distal lower leg, with prominent soft tissue air.   Electronically Signed   By: Garald Balding M.D.   On: 06/16/2013 21:37   Dg C-arm 1-60 Min  06/17/2013   CLINICAL DATA:  External fixation of left ankle fracture.  EXAM: DG C-ARM 61-120 MIN; LEFT TIBIA AND FIBULA - 2 VIEW  COMPARISON:  Left tibia/ fibula radiographs performed 06/16/2013  FINDINGS: Three fluoroscopic C-arm images are provided from the OR. These demonstrate external fixation screws transfixing the distal tibial diaphysis just above the level of the fracture. Apparent packing material is noted at the fracture site. The comminuted tibial and fibular fractures are again seen. These demonstrate mildly improved alignment in comparison to the prior study.  IMPRESSION: Status post external fixation of distal tibial fracture. Comminuted tibial and fibular fractures demonstrate mildly improved alignment.   Electronically Signed   By: Garald Balding M.D.   On: 06/17/2013 03:00    Shanker Kristeen Mans, MD  [facility] Pager:[phone]  If 7PM-7AM, please contact night-coverage [email or web address] Password [email or web address] 06/18/2013, 1:07 PM   LOS: 2 days   **Disclaimer: This note may have been dictated with voice recognition software. Similar  sounding words can inadvertently be transcribed and this note may contain transcription errors which may not have been corrected upon publication of note.**

## 2013-06-18 NOTE — Progress Notes (Signed)
Patient ID: [name], female   DOB: 11-19-1925, 80 y.o.   MRN: [id number] Patient comfortable this morning without complaints. Her foot is neurovascularly intact. Wound VAC is working well. Plan to take down the wound VAC on Monday to evaluate the soft tissue envelope. If the soft tissue envelope was viable then will plan for continuation of the external fixator with the wound VAC. Anticipate patient may require discharge to skilled nursing. Physical therapy progressive ambulation nonweightbearing on the left.

## 2013-06-18 NOTE — Progress Notes (Signed)
D/c'd pt foley cath per MD order. Pt tolerated well with the procedure. We will continue to monitor for some urinary output.

## 2013-06-18 NOTE — Progress Notes (Signed)
Attempted transfer to chair w/ 2 assist. Pt lacked enough strength in R leg to stand for pivot. Pt repositioned back into bed.

## 2013-06-19 DIAGNOSIS — S82209B Unspecified fracture of shaft of unspecified tibia, initial encounter for open fracture type I or II: Secondary | ICD-10-CM

## 2013-06-19 LAB — PROTIME-INR
INR: 1.5 — ABNORMAL HIGH (ref 0.00–1.49)
Prothrombin Time: 17.7 seconds — ABNORMAL HIGH (ref 11.6–15.2)

## 2013-06-19 NOTE — Progress Notes (Signed)
Wound vac collection reservoir changed due to frequent alarms of possible blockage. Total drainage collected so far is 366ml, which is total since procedure.

## 2013-06-19 NOTE — Progress Notes (Signed)
PATIENT DETAILS Name: MILANIA HAUBNER Age: 80 y.o. Sex: female Date of Birth: 07-09-1925 Admit Date: 06/16/2013 Admitting Physician Meredith Pel, MD MEQ:ASTM [name], MD  Subjective: No major complaints-daughter at bedisde  Assessment/Plan: A.Fib  - goal will be rate control, patient is on beta blocker which will be continued along with Cardizem at home dose, as needed IV Lopressor has been ordered, she was on Coumadin, however this has been held at the request of Orthopedics,as plans for possible IM nail Tuesday depending on removal of wound VAC tomorrow and evaluation of wound integrity  Hypothyroidism.  -Home dose Synthroid to be continued unchanged.   Dyslipidemia.  -On Lipitor 10 mg continue.   Chronic diastolic dysfunction - EF 60% on echogram in 2012. Compensated. Continue home dose Lasix, discontinue fluids once eating. Monitor clinically   Mechanical fall with left Open tibial fracture  -per primary primary team which is orthopedics.  Disposition: Remain inpatient  DVT Prophylaxis: Prophylactic Lovenox  Code Status: Full code   Family Communication Daughter at bedside  Procedures:  None  CONSULTS:  None  MEDICATIONS: Scheduled Meds: . antiseptic oral rinse  15 mL Mouth Rinse BID  . atorvastatin  10 mg Oral QPM  . B-complex with vitamin C  1 tablet Oral Daily  .  ceFAZolin (ANCEF) IV  2 g Intravenous Q6H  . cholecalciferol  1,000 Units Oral Daily  . diltiazem  120 mg Oral Daily  . docusate sodium  100 mg Oral BID  . enoxaparin (LOVENOX) injection  40 mg Subcutaneous Q24H  . ferrous sulfate  325 mg Oral Q breakfast  . furosemide  40 mg Oral BID  . levothyroxine  25 mcg Oral QAC breakfast  . metoprolol succinate  50 mg Oral BID  . pantoprazole  40 mg Oral QAC breakfast  . potassium chloride SA  20 mEq Oral Daily  . vitamin C  500 mg Oral Daily   Continuous Infusions:  PRN Meds:.acetaminophen, HYDROcodone-acetaminophen, metoprolol,  morphine injection, ondansetron (ZOFRAN) IV, temazepam  Antibiotics: Anti-infectives   Start     Dose/Rate Route Frequency Ordered Stop   06/17/13 0400  ceFAZolin (ANCEF) IVPB 2 g/50 mL premix     2 g 100 mL/hr over 30 Minutes Intravenous Every 6 hours 06/17/13 0225 06/20/13 0359   06/16/13 2315  gentamicin (GARAMYCIN) injection 120 mg     120 mg Intramuscular  Once 06/16/13 2304 06/17/13 0001   06/16/13 2315  vancomycin (VANCOCIN) powder 500 mg     500 mg Other To Surgery 06/16/13 2304 06/17/13 0002   06/16/13 2100  ceFAZolin (ANCEF) IVPB 1 g/50 mL premix     1 g 100 mL/hr over 30 Minutes Intravenous  Once 06/16/13 2048 06/16/13 2206   06/16/13 2030  ceFAZolin (ANCEF) IVPB 1 g/50 mL premix     1 g 100 mL/hr over 30 Minutes Intravenous  Once 06/16/13 2030 06/16/13 2136       PHYSICAL EXAM: Vital signs in last 24 hours: Filed Vitals:   06/18/13 1553 06/18/13 2102 06/19/13 0501 06/19/13 0928  BP: 117/76 115/76 137/87 119/65  Pulse: 97 91 82   Temp: 98.2 F (36.8 C) 98 F (36.7 C) 97.4 F (36.3 C)   TempSrc: Oral Oral Oral   Resp: 14 16 16    Height:      Weight:      SpO2: 91% 94% 95% 96%    Weight change:  Filed Weights   06/17/13 0223  Weight: 84.823 kg (187 lb)   Body mass index is 33.13 kg/(m^2).   Gen Exam: Awake and alert with clear speech.   Neck: Supple, No JVD.   Chest: B/L Clear.   CVS: S1 S2 irregular, no murmurs.  Abdomen: soft, BS +, non tender, non distended.  Extremities:left foot in compressive bandage with a drain in place Neurologic: Non Focal.   Skin: No Rash.   Wounds: N/A.   Intake/Output from previous day:  Intake/Output Summary (Last 24 hours) at 06/19/13 1145 Last data filed at 06/19/13 1011  Gross per 24 hour  Intake    960 ml  Output   1950 ml  Net   -990 ml     LAB RESULTS: CBC  Recent Labs Lab 06/16/13 2033 06/17/13 0803  WBC 6.5 9.5  HGB 14.4 13.5  HCT 42.6 39.5  PLT 203 187  MCV 107.8* 107.6*  MCH 36.5* 36.8*    MCHC 33.8 34.2  RDW 12.7 12.7  LYMPHSABS 1.5  --   MONOABS 0.9  --   EOSABS 0.1  --   BASOSABS 0.0  --     Chemistries   Recent Labs Lab 06/16/13 2033 06/17/13 0803  NA 139 137  K 3.6* 3.9  CL 101 97  CO2 23 26  GLUCOSE 88 137*  BUN 13 8  CREATININE 1.10 0.73  CALCIUM 8.8 8.4  MG 1.8  --     CBG: No results found for this basename: GLUCAP,  in the last 168 hours  GFR Estimated Creatinine Clearance: 53.1 ml/min (by C-G formula based on Cr of 0.73).  Coagulation profile  Recent Labs Lab 06/16/13 2033 06/17/13 0803 06/18/13 0637 06/19/13 0721  INR 2.59* 2.39* 1.86* 1.50*    Cardiac Enzymes No results found for this basename: CK, CKMB, TROPONINI, MYOGLOBIN,  in the last 168 hours  No components found with this basename: POCBNP,  No results found for this basename: DDIMER,  in the last 72 hours No results found for this basename: HGBA1C,  in the last 72 hours No results found for this basename: CHOL, HDL, LDLCALC, TRIG, CHOLHDL, LDLDIRECT,  in the last 72 hours No results found for this basename: TSH, T4TOTAL, FREET3, T3FREE, THYROIDAB,  in the last 72 hours No results found for this basename: VITAMINB12, FOLATE, FERRITIN, TIBC, IRON, RETICCTPCT,  in the last 72 hours No results found for this basename: LIPASE, AMYLASE,  in the last 72 hours  Urine Studies No results found for this basename: UACOL, UAPR, USPG, UPH, UTP, UGL, UKET, UBIL, UHGB, UNIT, UROB, ULEU, UEPI, UWBC, URBC, UBAC, CAST, CRYS, UCOM, BILUA,  in the last 72 hours  MICROBIOLOGY: No results found for this or any previous visit (from the past 240 hour(s)).  RADIOLOGY STUDIES/RESULTS: Dg Tibia/fibula Left  06/17/2013   CLINICAL DATA:  External fixation of left ankle fracture.  EXAM: DG C-ARM 61-120 MIN; LEFT TIBIA AND FIBULA - 2 VIEW  COMPARISON:  Left tibia/ fibula radiographs performed 06/16/2013  FINDINGS: Three fluoroscopic C-arm images are provided from the OR. These demonstrate external  fixation screws transfixing the distal tibial diaphysis just above the level of the fracture. Apparent packing material is noted at the fracture site. The comminuted tibial and fibular fractures are again seen. These demonstrate mildly improved alignment in comparison to the prior study.  IMPRESSION: Status post external fixation of distal tibial fracture. Comminuted tibial and fibular fractures demonstrate mildly improved alignment.   Electronically Signed   By: Francoise Schaumann.D.  On: 06/17/2013 03:00   Dg Chest Portable 1 View  06/16/2013   CLINICAL DATA:  Leg pain post fall  EXAM: PORTABLE CHEST - 1 VIEW  COMPARISON:  Portable exam at 2102 hrs compared to 06/15/2012  FINDINGS: Enlargement of cardiac silhouette.  Mediastinal contours and pulmonary vascularity normal.  Numerous EKG leads project over chest particularly RIGHT middle lobe.  Chronic subsegmental atelectasis versus scarring in RIGHT middle lobe, superimposed with EKG leads, better visualized on previous exam.  No definite acute infiltrate, pleural effusion or pneumothorax.  Bones demineralized.  IMPRESSION: Enlargement of cardiac silhouette.  Chronic subsegmental atelectasis versus scarring in RIGHT middle lobe.   Electronically Signed   By: Lavonia Dana M.D.   On: 06/16/2013 21:35   Dg Tibia/fibula Left Port  06/16/2013   CLINICAL DATA:  Status post fall; left lower leg pain and open deformity.  EXAM: PORTABLE LEFT TIBIA AND FIBULA - 2 VIEW  COMPARISON:  None.  FINDINGS: There are comminuted fractures involving the distal tibial and fibular diaphyses, with one shaft width anterior and lateral displacement of the distal tibia, shortening at the fracture site, and more than one shaft width lateral displacement of the distal fibula. Scattered soft tissue air is seen, reflecting the open nature of the fracture, with marked associated soft tissue disruption.  The ankle mortise appears grossly intact. The distal interosseous space is grossly  preserved.  There is also a mildly comminuted fracture involving the proximal fibular diaphysis, with associated soft tissue swelling. Given the degree of soft tissue injury, would follow up to ensure that no compartment syndrome or rhabdomyolysis develops.  No significant knee joint effusion is identified. There is mild cortical irregularity along the articular surface of the patella. Scattered vascular calcifications are seen.  IMPRESSION: 1. Comminuted fractures involving the distal tibial and fibular diaphyses, with one shaft width anterior and lateral displacement of the distal tibia and shortening at the fracture site, and more than one shaft width lateral displacement of the distal fibula. This is an open fracture, with marked soft tissue disruption and scattered soft tissue air. 2. Mildly comminuted fracture involving the proximal fibular diaphysis, with associated soft tissue swelling. 3. Given the degree of soft tissue injury, would follow up to ensure that no compartment syndrome or rhabdomyolysis develops.   Electronically Signed   By: Garald Balding M.D.   On: 06/16/2013 21:35   Dg Ankle Left Port  06/17/2013   CLINICAL DATA:  LEFT ankle fracture post external fixation  EXAM: PORTABLE LEFT ANKLE - 2 VIEW  COMPARISON:  Intraoperative images 06/17/2013  FINDINGS: External fixation device has been applied.  Distal RIGHT tibial fracture identified displaced medially and slightly anteriorly.  Displaced fibular diaphyseal fracture identified displaced medially and slightly anteriorly.  Ankle joint alignment appears normal.  Bones appear slightly demineralized.  Wound VAC and antibiotic beads noted.  IMPRESSION: Postoperative changes from external fixation as above.   Electronically Signed   By: Lavonia Dana M.D.   On: 06/17/2013 03:05   Dg Foot 2 Views Left  06/16/2013   CLINICAL DATA:  Status post fall; left lower leg pain and open deformity.  EXAM: LEFT FOOT - 2 VIEW  COMPARISON:  None.  FINDINGS: The  comminuted and displaced fractures through the distal diaphyses of the tibia and fibula are better characterized on concurrent tibia/fibula radiographs.  No additional fractures are seen. Visualized joint spaces are grossly preserved. The subtalar joint is unremarkable in appearance. Tiny plantar and posterior calcaneal spurs are noted.  Marked soft tissue disruption is noted about the distal lower leg, with prominent soft tissue air.  IMPRESSION: 1. Comminuted displaced fractures through the distal diaphyses of the tibia and fibula are better characterized on concurrent tibia/fibula radiographs. 2. No additional fractures seen. 3. Marked soft tissue disruption about the distal lower leg, with prominent soft tissue air.   Electronically Signed   By: Garald Balding M.D.   On: 06/16/2013 21:37   Dg C-arm 1-60 Min  06/17/2013   CLINICAL DATA:  External fixation of left ankle fracture.  EXAM: DG C-ARM 61-120 MIN; LEFT TIBIA AND FIBULA - 2 VIEW  COMPARISON:  Left tibia/ fibula radiographs performed 06/16/2013  FINDINGS: Three fluoroscopic C-arm images are provided from the OR. These demonstrate external fixation screws transfixing the distal tibial diaphysis just above the level of the fracture. Apparent packing material is noted at the fracture site. The comminuted tibial and fibular fractures are again seen. These demonstrate mildly improved alignment in comparison to the prior study.  IMPRESSION: Status post external fixation of distal tibial fracture. Comminuted tibial and fibular fractures demonstrate mildly improved alignment.   Electronically Signed   By: Garald Balding M.D.   On: 06/17/2013 03:00    Dellie Piasecki Kristeen Mans, MD  [facility] Pager:[phone]  If 7PM-7AM, please contact night-coverage [email or web address] Password [email or web address] 06/19/2013, 11:45 AM   LOS: 3 days   **Disclaimer: This note may have been dictated with voice recognition software. Similar sounding words can inadvertently be  transcribed and this note may contain transcription errors which may not have been corrected upon publication of note.**

## 2013-06-19 NOTE — Progress Notes (Signed)
Patient ID: [name], female   DOB: 05/27/1925, 80 y.o.   MRN: [id number] Wound VAC drainage has decreased. Plan for removal of wound VAC tomorrow and evaluation of wound integrity. If the wound is viable anticipate maintaining the external fixator until the soft tissue envelope has healed.  Patient did not have enough strength to pivot on the right lower extremity. Patient will definitely need discharge to skilled nursing.

## 2013-06-20 ENCOUNTER — Inpatient Hospital Stay (HOSPITAL_COMMUNITY): Payer: Medicare Other

## 2013-06-20 LAB — CBC
HCT: 38.5 % (ref 36.0–46.0)
Hemoglobin: 13.7 g/dL (ref 12.0–15.0)
MCH: 39.1 pg — ABNORMAL HIGH (ref 26.0–34.0)
MCHC: 35.6 g/dL (ref 30.0–36.0)
MCV: 110 fL — ABNORMAL HIGH (ref 78.0–100.0)
Platelets: 224 10*3/uL (ref 150–400)
RBC: 3.5 MIL/uL — ABNORMAL LOW (ref 3.87–5.11)
RDW: 12.5 % (ref 11.5–15.5)
WBC: 7.3 10*3/uL (ref 4.0–10.5)

## 2013-06-20 LAB — BASIC METABOLIC PANEL
BUN: 14 mg/dL (ref 6–23)
CO2: 37 mEq/L — ABNORMAL HIGH (ref 19–32)
Calcium: 8.7 mg/dL (ref 8.4–10.5)
Chloride: 88 mEq/L — ABNORMAL LOW (ref 96–112)
Creatinine, Ser: 0.92 mg/dL (ref 0.50–1.10)
GFR calc Af Amer: 64 mL/min — ABNORMAL LOW (ref >90)
GFR calc non Af Amer: 55 mL/min — ABNORMAL LOW (ref >90)
Glucose, Bld: 106 mg/dL — ABNORMAL HIGH (ref 70–99)
Potassium: 3.2 mEq/L — ABNORMAL LOW (ref 3.7–5.3)
Sodium: 137 mEq/L (ref 137–147)

## 2013-06-20 LAB — PROTIME-INR
INR: 1.3 (ref 0.00–1.49)
Prothrombin Time: 15.9 seconds — ABNORMAL HIGH (ref 11.6–15.2)

## 2013-06-20 MED ORDER — TEMAZEPAM 15 MG PO CAPS
15.0000 mg | ORAL_CAPSULE | Freq: Every day | ORAL | Status: DC
  Administered 2013-06-20 – 2013-06-24 (×5): 15 mg via ORAL
  Filled 2013-06-20 (×5): qty 1

## 2013-06-20 NOTE — Progress Notes (Signed)
Physical Therapy Treatment Patient Details Name: [name] MRN: [id number] DOB: 05-Dec-1925 Today's Date: 06/20/2013    History of Present Illness 80 y.o. female admitted with an open distal tib-fib fracture, s/p external fixation.     PT Comments    Patient agreeable to work with therapy but showing some anxiety and fear due to current situation and having external fixator on. Per family, wound vac was taken off today and during sitting notice that bleeding was occuring from would site. RN made aware and patient transferred and LLE elevated on pillow and pad. Patient is planning to have possible ORIF tomorrow. Will continue to follow as appropriate and recommend SNF at DC.   Follow Up Recommendations  SNF     Equipment Recommendations  Rolling walker with 5" wheels;Wheelchair (measurements PT)    Recommendations for Other Services       Precautions / Restrictions Precautions Precautions: Fall Restrictions LLE Weight Bearing: Non weight bearing Other Position/Activity Restrictions: external fixation LLE    Mobility  Bed Mobility Overal bed mobility: +2 for physical assistance;Needs Assistance Bed Mobility: Sit to Supine     Supine to sit: +2 for physical assistance;+2 for safety/equipment;Mod assist     General bed mobility comments: assist to support LLE, cues for hand placement/technique, patient able to assist with trunk control   Transfers Overall transfer level: Needs assistance Equipment used: None Transfers: Stand Pivot Transfers   Stand pivot transfers: +2 physical assistance;+2 safety/equipment;From elevated surface;Mod assist       General transfer comment: assist for all aspects of transfer and to maintain NWB. Patient very anxious throughout  Ambulation/Gait                 Stairs            Wheelchair Mobility    Modified Rankin (Stroke Patients Only)       Balance     Sitting balance-Leahy Scale: Fair       Standing  balance-Leahy Scale: Poor                      Cognition Arousal/Alertness: Awake/alert Behavior During Therapy: WFL for tasks assessed/performed;Anxious Overall Cognitive Status: Within Functional Limits for tasks assessed                      Exercises      General Comments        Pertinent Vitals/Pain 8/10  LLE. RN aware and patient repositioned    Home Living                      Prior Function            PT Goals (current goals can now be found in the care plan section) Progress towards PT goals: Progressing toward goals    Frequency  Min 3X/week    PT Plan Current plan remains appropriate;Frequency needs to be updated    Co-evaluation             End of Session Equipment Utilized During Treatment: Gait belt Activity Tolerance: Patient limited by fatigue;Patient limited by pain Patient left: in chair;with call bell/phone within reach     Time: 1006-1034 PT Time Calculation (min): 28 min  Charges:  $Therapeutic Activity: 23-37 mins                    G Codes:      Tonia Brooms Katisha Shimizu 06/20/2013, 12:00  PM 06/20/2013 [place] PTA [phone] pager [phone] office

## 2013-06-20 NOTE — Clinical Social Work Placement (Addendum)
Clinical Social Work Department CLINICAL SOCIAL WORK PLACEMENT NOTE 06/20/2013  Patient:  JUSTYNE, ROELL  Account Number:  [id number] Admit date:  06/16/2013  Clinical Social Worker:  Kemper Durie, [place]  Date/time:  06/20/2013 11:00 AM  Clinical Social Work is seeking post-discharge placement for this patient at the following level of care:   [place]   (*CSW will update this form in Epic as items are completed)   06/20/2013  Patient/family provided with [place] Department of Clinical Social Work's list of facilities offering this level of care within the geographic area requested by the patient (or if unable, by the patient's family).  06/20/2013  Patient/family informed of their freedom to choose among providers that offer the needed level of care, that participate in Medicare, Medicaid or managed care program needed by the patient, have an available bed and are willing to accept the patient.  06/20/2013  Patient/family informed of [facility]' ownership interest in [facility], as well as of the fact that they are under no obligation to receive care at this facility.  PASARR submitted to EDS on 06/20/2013 PASARR number received from EDS on 06/20/2013  FL2 transmitted to all facilities in geographic area requested by pt/family on  06/20/2013 FL2 transmitted to all facilities within larger geographic area on   Patient informed that his/her managed care company has contracts with or will negotiate with  certain facilities, including the following:     Patient/family informed of bed offers received:  06/20/2013 Patient chooses bed at [facility] Physician recommends and patient chooses bed at    Patient to be transferred to [facility] on 06/25/13- Blima Rich, [place]   Patient to be transferred to facility by Ambulance   The following physician request were entered in Epic:   Additional Comments:    Liz Beach MSW, [place], [place], [phone]

## 2013-06-20 NOTE — Progress Notes (Signed)
Pt stable vss Wound ok distally - medial aspect of flap less viable - wound size 7 x 2.5 cm Foot perfused PSUconsult for coverage Possible nail tues

## 2013-06-20 NOTE — Clinical Social Work Psychosocial (Signed)
Clinical Social Work Department BRIEF PSYCHOSOCIAL ASSESSMENT 06/20/2013  Patient:  [name], [name]     Account Number:  [id number]     Admit date:  06/16/2013  Clinical Social Worker:  Lovey Newcomer  Date/Time:  06/20/2013 11:00 AM  Referred by:  Physician  Date Referred:  06/20/2013 Referred for  SNF Placement   Other Referral:   Interview type:  Patient Other interview type:   Patient, patient's husband, and patient's daughter interviewed at bedside to complete assessment.    PSYCHOSOCIAL DATA Living Status:  HUSBAND Admitted from facility:   Level of care:   Primary support name:  Ernie Hew Primary support relationship to patient:  SPOUSE Degree of support available:   Support is strong.    CURRENT CONCERNS Current Concerns  Post-Acute Placement   Other Concerns:    SOCIAL WORK ASSESSMENT / PLAN CSW met with patient, patient's daughter, and patient's husband at bedside to complete assessment. Patient states that she and in husband were in the process of moving into [place] when this hospitalization occured. She agrees with PT's recommendation for SNF at discharge and states that she would want to go to [facility] if possible. CSW inquired about how patient is feeling about needing to go to a SNF. Patient states, "Well the way I see it, I don't really have a choice, I can't walk with these fractures. But I do want to go to [facility] SNF if I can." Patient was sad about the unfortunate situation but is optimistic she will be able to return to [place] with her husband after SNF.   Assessment/plan status:  Psychosocial Support/Ongoing Assessment of Needs Other assessment/ plan:   Complete FL2, Fax, PASRR.   Information/referral to community resources:   CSW contacted [facility] and they have stated that they have a bed waiting for the patient at their SNF. CSW notified patient of this who is very happy to hear this news.     PATIENT'S/FAMILY'S RESPONSE TO PLAN OF CARE: Patient and family plan for patient to discharge to [facility] when ready. Patient was engaged in assessment and in great spirits after hearing that she could go to [facility] SNF. CSW will assist with DC.       Liz Beach MSW, [name], [place], [phone]

## 2013-06-21 ENCOUNTER — Inpatient Hospital Stay (HOSPITAL_COMMUNITY): Payer: Medicare Other | Admitting: Anesthesiology

## 2013-06-21 ENCOUNTER — Inpatient Hospital Stay (HOSPITAL_COMMUNITY): Payer: Medicare Other

## 2013-06-21 ENCOUNTER — Encounter (HOSPITAL_COMMUNITY): Payer: Medicare Other | Admitting: Anesthesiology

## 2013-06-21 ENCOUNTER — Encounter (HOSPITAL_COMMUNITY)
Admission: EM | Disposition: A | Discharge status: Skilled Nursing Facility | Payer: Self-pay | Source: Home / Self Care | Attending: Orthopedic Surgery

## 2013-06-21 ENCOUNTER — Encounter (HOSPITAL_COMMUNITY): Payer: Self-pay | Admitting: Anesthesiology

## 2013-06-21 HISTORY — PX: APPLICATION OF A-CELL OF EXTREMITY: SHX6303

## 2013-06-21 HISTORY — PX: TIBIA IM NAIL INSERTION: SHX2516

## 2013-06-21 HISTORY — PX: APPLICATION OF WOUND VAC: SHX5189

## 2013-06-21 HISTORY — PX: EXTERNAL FIXATION REMOVAL: SHX5040

## 2013-06-21 LAB — CBC
HCT: 39.8 % (ref 36.0–46.0)
Hemoglobin: 13.8 g/dL (ref 12.0–15.0)
MCH: 38 pg — ABNORMAL HIGH (ref 26.0–34.0)
MCHC: 34.7 g/dL (ref 30.0–36.0)
MCV: 109.6 fL — ABNORMAL HIGH (ref 78.0–100.0)
Platelets: 254 10*3/uL (ref 150–400)
RBC: 3.63 MIL/uL — ABNORMAL LOW (ref 3.87–5.11)
RDW: 12.4 % (ref 11.5–15.5)
WBC: 11.4 10*3/uL — ABNORMAL HIGH (ref 4.0–10.5)

## 2013-06-21 LAB — BASIC METABOLIC PANEL
BUN: 16 mg/dL (ref 6–23)
CO2: 36 mEq/L — ABNORMAL HIGH (ref 19–32)
Calcium: 9.3 mg/dL (ref 8.4–10.5)
Chloride: 89 mEq/L — ABNORMAL LOW (ref 96–112)
Creatinine, Ser: 0.88 mg/dL (ref 0.50–1.10)
GFR calc Af Amer: 68 mL/min — ABNORMAL LOW (ref >90)
GFR calc non Af Amer: 58 mL/min — ABNORMAL LOW (ref >90)
Glucose, Bld: 111 mg/dL — ABNORMAL HIGH (ref 70–99)
Potassium: 3 mEq/L — ABNORMAL LOW (ref 3.7–5.3)
Sodium: 136 mEq/L — ABNORMAL LOW (ref 137–147)

## 2013-06-21 LAB — CREATININE, SERUM
Creatinine, Ser: 0.87 mg/dL (ref 0.50–1.10)
GFR calc Af Amer: 68 mL/min — ABNORMAL LOW (ref >90)
GFR calc non Af Amer: 59 mL/min — ABNORMAL LOW (ref >90)

## 2013-06-21 LAB — MAGNESIUM: Magnesium: 1.5 mg/dL (ref 1.5–2.5)

## 2013-06-21 LAB — PROTIME-INR
INR: 1.24 (ref 0.00–1.49)
Prothrombin Time: 15.3 seconds — ABNORMAL HIGH (ref 11.6–15.2)

## 2013-06-21 LAB — SURGICAL PCR SCREEN
MRSA, PCR: NEGATIVE
Staphylococcus aureus: NEGATIVE

## 2013-06-21 SURGERY — INSERTION, INTRAMEDULLARY ROD, TIBIA
Anesthesia: General | Site: Leg Lower | Laterality: Left

## 2013-06-21 MED ORDER — MIDAZOLAM HCL 5 MG/5ML IJ SOLN
INTRAMUSCULAR | Status: DC | PRN
  Administered 2013-06-21: 1 mg via INTRAVENOUS

## 2013-06-21 MED ORDER — ROCURONIUM BROMIDE 100 MG/10ML IV SOLN
INTRAVENOUS | Status: DC | PRN
  Administered 2013-06-21: 50 mg via INTRAVENOUS
  Administered 2013-06-21: 10 mg via INTRAVENOUS

## 2013-06-21 MED ORDER — ONDANSETRON HCL 4 MG/2ML IJ SOLN: 4.0000 mg | Freq: Four times a day (QID) | INTRAMUSCULAR | Status: DC | PRN

## 2013-06-21 MED ORDER — LACTATED RINGERS IV SOLN: INTRAVENOUS | Status: DC

## 2013-06-21 MED ORDER — FENTANYL CITRATE 0.05 MG/ML IJ SOLN
INTRAMUSCULAR | Status: DC | PRN
  Administered 2013-06-21: 150 ug via INTRAVENOUS
  Administered 2013-06-21: 100 ug via INTRAVENOUS

## 2013-06-21 MED ORDER — CEFAZOLIN SODIUM-DEXTROSE 2-3 GM-% IV SOLR
2.0000 g | Freq: Three times a day (TID) | INTRAVENOUS | Status: AC
  Administered 2013-06-21 – 2013-06-24 (×9): 2 g via INTRAVENOUS
  Filled 2013-06-21 (×10): qty 50

## 2013-06-21 MED ORDER — FENTANYL CITRATE 0.05 MG/ML IJ SOLN
INTRAMUSCULAR | Status: AC
  Filled 2013-06-21: qty 5

## 2013-06-21 MED ORDER — METOCLOPRAMIDE HCL 5 MG/ML IJ SOLN: 5.0000 mg | Freq: Three times a day (TID) | INTRAMUSCULAR | Status: DC | PRN

## 2013-06-21 MED ORDER — PHENYLEPHRINE HCL 10 MG/ML IJ SOLN
INTRAMUSCULAR | Status: DC | PRN
  Administered 2013-06-21 (×2): 80 ug via INTRAVENOUS

## 2013-06-21 MED ORDER — LACTATED RINGERS IV SOLN
INTRAVENOUS | Status: DC | PRN
  Administered 2013-06-21 (×2): via INTRAVENOUS

## 2013-06-21 MED ORDER — FENTANYL CITRATE 0.05 MG/ML IJ SOLN
INTRAMUSCULAR | Status: AC
  Filled 2013-06-21: qty 2

## 2013-06-21 MED ORDER — MORPHINE SULFATE 2 MG/ML IJ SOLN
INTRAMUSCULAR | Status: AC
  Administered 2013-06-21: 2 mg via INTRAVENOUS
  Filled 2013-06-21: qty 1

## 2013-06-21 MED ORDER — PROPOFOL 10 MG/ML IV BOLUS
INTRAVENOUS | Status: DC | PRN
  Administered 2013-06-21: 110 mg via INTRAVENOUS

## 2013-06-21 MED ORDER — CEFAZOLIN SODIUM-DEXTROSE 2-3 GM-% IV SOLR
INTRAVENOUS | Status: DC | PRN
  Administered 2013-06-21: 2 g via INTRAVENOUS

## 2013-06-21 MED ORDER — FENTANYL CITRATE 0.05 MG/ML IJ SOLN
INTRAMUSCULAR | Status: AC
  Administered 2013-06-21: 19:00:00
  Filled 2013-06-21: qty 2

## 2013-06-21 MED ORDER — KCL IN DEXTROSE-NACL 40-5-0.9 MEQ/L-%-% IV SOLN
INTRAVENOUS | Status: DC
  Administered 2013-06-21 (×2): via INTRAVENOUS
  Filled 2013-06-21 (×2): qty 1000

## 2013-06-21 MED ORDER — LACTATED RINGERS IV SOLN
INTRAVENOUS | Status: DC
  Administered 2013-06-21: 15:00:00 via INTRAVENOUS

## 2013-06-21 MED ORDER — ENOXAPARIN SODIUM 30 MG/0.3ML ~~LOC~~ SOLN
30.0000 mg | SUBCUTANEOUS | Status: DC
  Administered 2013-06-22 – 2013-06-25 (×4): 30 mg via SUBCUTANEOUS
  Filled 2013-06-21 (×6): qty 0.3

## 2013-06-21 MED ORDER — 0.9 % SODIUM CHLORIDE (POUR BTL) OPTIME
TOPICAL | Status: DC | PRN
  Administered 2013-06-21 (×6): 1000 mL

## 2013-06-21 MED ORDER — MORPHINE SULFATE 2 MG/ML IJ SOLN
2.0000 mg | Freq: Once | INTRAMUSCULAR | Status: AC
Start: 2013-06-21 — End: 2013-06-21
  Administered 2013-06-21: 2 mg via INTRAVENOUS

## 2013-06-21 MED ORDER — ONDANSETRON HCL 4 MG PO TABS: 4.0000 mg | ORAL_TABLET | Freq: Four times a day (QID) | ORAL | Status: DC | PRN

## 2013-06-21 MED ORDER — WARFARIN - PHARMACIST DOSING INPATIENT
Freq: Every day | Status: DC
  Administered 2013-06-22: 18:00:00
  Administered 2013-06-23 – 2013-06-24 (×2): 1

## 2013-06-21 MED ORDER — WARFARIN SODIUM 5 MG PO TABS
5.0000 mg | ORAL_TABLET | Freq: Once | ORAL | Status: AC
Start: 2013-06-21 — End: 2013-06-21
  Administered 2013-06-21: 5 mg via ORAL
  Filled 2013-06-21: qty 1

## 2013-06-21 MED ORDER — MIDAZOLAM HCL 2 MG/2ML IJ SOLN
INTRAMUSCULAR | Status: AC
  Filled 2013-06-21: qty 2

## 2013-06-21 MED ORDER — PROPOFOL 10 MG/ML IV BOLUS
INTRAVENOUS | Status: AC
  Filled 2013-06-21: qty 20

## 2013-06-21 MED ORDER — METOCLOPRAMIDE HCL 10 MG PO TABS: 5.0000 mg | ORAL_TABLET | Freq: Three times a day (TID) | ORAL | Status: DC | PRN

## 2013-06-21 MED ORDER — FENTANYL CITRATE 0.05 MG/ML IJ SOLN
25.0000 ug | INTRAMUSCULAR | Status: DC | PRN
  Administered 2013-06-21 (×3): 50 ug via INTRAVENOUS

## 2013-06-21 SURGICAL SUPPLY — 91 items
BANDAGE ELASTIC 4 VELCRO ST LF (GAUZE/BANDAGES/DRESSINGS) ×4 IMPLANT
BANDAGE ELASTIC 6 VELCRO ST LF (GAUZE/BANDAGES/DRESSINGS) ×4 IMPLANT
BANDAGE ESMARK 6X9 LF (GAUZE/BANDAGES/DRESSINGS) IMPLANT
BANDAGE GAUZE ELAST BULKY 4 IN (GAUZE/BANDAGES/DRESSINGS) ×4 IMPLANT
BIT DRILL LONG 4.0 (BIT) IMPLANT
BIT DRILL SHORT 4.0 (BIT) IMPLANT
BLADE SURG 15 STRL LF DISP TIS (BLADE) ×2 IMPLANT
BLADE SURG 15 STRL SS (BLADE) ×4
BLADE SURG ROTATE 9660 (MISCELLANEOUS) IMPLANT
BNDG CMPR 9X6 STRL LF SNTH (GAUZE/BANDAGES/DRESSINGS)
BNDG COHESIVE 4X5 TAN STRL (GAUZE/BANDAGES/DRESSINGS) IMPLANT
BNDG COHESIVE 6X5 TAN STRL LF (GAUZE/BANDAGES/DRESSINGS) ×4 IMPLANT
BNDG ESMARK 6X9 LF (GAUZE/BANDAGES/DRESSINGS)
CONT SPEC 4OZ CLIKSEAL STRL BL (MISCELLANEOUS) ×2 IMPLANT
COVER SURGICAL LIGHT HANDLE (MISCELLANEOUS) ×8 IMPLANT
CUFF TOURNIQUET SINGLE 34IN LL (TOURNIQUET CUFF) IMPLANT
CUFF TOURNIQUET SINGLE 44IN (TOURNIQUET CUFF) IMPLANT
DRAPE C-ARM 42X72 X-RAY (DRAPES) ×4 IMPLANT
DRAPE EXTREMITY T 121X128X90 (DRAPE) IMPLANT
DRAPE INCISE IOBAN 66X45 STRL (DRAPES) ×2 IMPLANT
DRAPE ORTHO SPLIT 77X108 STRL (DRAPES) ×8
DRAPE PROXIMA HALF (DRAPES) ×12 IMPLANT
DRAPE SURG ORHT 6 SPLT 77X108 (DRAPES) ×4 IMPLANT
DRAPE U-SHAPE 47X51 STRL (DRAPES) ×4 IMPLANT
DRILL BIT LONG 4.0 (BIT) ×4
DRILL BIT SHORT 4.0 (BIT) ×8
DRSG ADAPTIC 3X8 NADH LF (GAUZE/BANDAGES/DRESSINGS) ×2 IMPLANT
DRSG EMULSION OIL 3X3 NADH (GAUZE/BANDAGES/DRESSINGS) ×4 IMPLANT
DRSG PAD ABDOMINAL 8X10 ST (GAUZE/BANDAGES/DRESSINGS) ×4 IMPLANT
DURAPREP 26ML APPLICATOR (WOUND CARE) ×2 IMPLANT
ELECT REM PT RETURN 9FT ADLT (ELECTROSURGICAL) ×4
ELECTRODE REM PT RTRN 9FT ADLT (ELECTROSURGICAL) ×2 IMPLANT
FACESHIELD WRAPAROUND (MASK) IMPLANT
FACESHIELD WRAPAROUND OR TEAM (MASK) IMPLANT
GAUZE XEROFORM 1X8 LF (GAUZE/BANDAGES/DRESSINGS) ×4 IMPLANT
GAUZE XEROFORM 5X9 LF (GAUZE/BANDAGES/DRESSINGS) ×4 IMPLANT
GLOVE BIOGEL PI IND STRL 8 (GLOVE) ×2 IMPLANT
GLOVE BIOGEL PI INDICATOR 8 (GLOVE) ×2
GLOVE SURG ORTHO 8.0 STRL STRW (GLOVE) ×4 IMPLANT
GOWN STRL REUS W/ TWL LRG LVL3 (GOWN DISPOSABLE) ×6 IMPLANT
GOWN STRL REUS W/ TWL XL LVL3 (GOWN DISPOSABLE) ×2 IMPLANT
GOWN STRL REUS W/TWL LRG LVL3 (GOWN DISPOSABLE) ×12
GOWN STRL REUS W/TWL XL LVL3 (GOWN DISPOSABLE) ×4
GUIDE PIN 3.2MM (MISCELLANEOUS) ×4
GUIDE PIN ORTH 343X3.2XBRAD (MISCELLANEOUS) IMPLANT
GUIDE ROD 3.0 (MISCELLANEOUS) ×4
KIT BASIN OR (CUSTOM PROCEDURE TRAY) ×4 IMPLANT
KIT ROOM TURNOVER OR (KITS) ×4 IMPLANT
MANIFOLD NEPTUNE II (INSTRUMENTS) ×4 IMPLANT
MATRIX SURGICAL PSMX 7X10CM (Tissue) ×2 IMPLANT
MICROMATRIX 500MG (Tissue) ×8 IMPLANT
NAIL TIBIAL 11.5X35 (Nail) ×2 IMPLANT
NS IRRIG 1000ML POUR BTL (IV SOLUTION) ×4 IMPLANT
PACK GENERAL/GYN (CUSTOM PROCEDURE TRAY) ×4 IMPLANT
PAD ARMBOARD 7.5X6 YLW CONV (MISCELLANEOUS) ×8 IMPLANT
PAD CAST 4YDX4 CTTN HI CHSV (CAST SUPPLIES) ×4 IMPLANT
PADDING CAST ABS 4INX4YD NS (CAST SUPPLIES) ×2
PADDING CAST ABS 6INX4YD NS (CAST SUPPLIES) ×2
PADDING CAST ABS COTTON 4X4 ST (CAST SUPPLIES) IMPLANT
PADDING CAST ABS COTTON 6X4 NS (CAST SUPPLIES) IMPLANT
PADDING CAST COTTON 4X4 STRL (CAST SUPPLIES) ×8
PADDING CAST COTTON 6X4 STRL (CAST SUPPLIES) ×8 IMPLANT
RETRIEVER SUT LRG (INSTRUMENTS) ×2 IMPLANT
ROD GUIDE 3.0 (MISCELLANEOUS) IMPLANT
SCREW TRIGEN LOW PROF 5.0X27.5 (Screw) ×2 IMPLANT
SCREW TRIGEN LOW PROF 5.0X30 (Screw) ×2 IMPLANT
SCREW TRIGEN LOW PROF 5.0X37.5 (Screw) ×4 IMPLANT
SCREW TRIGEN LOW PROF 5.0X40 (Screw) ×2 IMPLANT
SOLUTION PARTIC MCRMTRX 500MG (Tissue) IMPLANT
SPONGE GAUZE 4X4 12PLY (GAUZE/BANDAGES/DRESSINGS) ×8 IMPLANT
SPONGE GAUZE 4X4 12PLY STER LF (GAUZE/BANDAGES/DRESSINGS) ×2 IMPLANT
STAPLER VISISTAT 35W (STAPLE) ×4 IMPLANT
STOCKINETTE IMPERVIOUS 9X36 MD (GAUZE/BANDAGES/DRESSINGS) IMPLANT
STOCKINETTE IMPERVIOUS LG (DRAPES) ×4 IMPLANT
SUT ETHILON 2 0 FS 18 (SUTURE) ×2 IMPLANT
SUT ETHILON 3 0 PS 1 (SUTURE) ×8 IMPLANT
SUT ETHILON 4 0 FS 1 (SUTURE) IMPLANT
SUT FIBERWIRE #5 38 CONV NDL (SUTURE) ×4
SUT MNCRL AB 3-0 PS2 18 (SUTURE) ×2 IMPLANT
SUT VIC AB 0 CT1 27 (SUTURE) ×4
SUT VIC AB 0 CT1 27XBRD ANBCTR (SUTURE) ×2 IMPLANT
SUT VIC AB 2-0 CT1 27 (SUTURE) ×4
SUT VIC AB 2-0 CT1 TAPERPNT 27 (SUTURE) ×2 IMPLANT
SUT VIC AB 3-0 PS2 18 (SUTURE) ×4
SUT VIC AB 3-0 PS2 18XBRD (SUTURE) IMPLANT
SUT VIC AB 5-0 PS2 18 (SUTURE) ×2 IMPLANT
SUTURE FIBERWR #5 38 CONV NDL (SUTURE) IMPLANT
TOWEL OR 17X24 6PK STRL BLUE (TOWEL DISPOSABLE) ×4 IMPLANT
TOWEL OR 17X26 10 PK STRL BLUE (TOWEL DISPOSABLE) ×4 IMPLANT
TRAY FOLEY CATH 16FRSI W/METER (SET/KITS/TRAYS/PACK) IMPLANT
WATER STERILE IRR 1000ML POUR (IV SOLUTION) ×4 IMPLANT

## 2013-06-21 NOTE — Progress Notes (Signed)
Utilization review completed.  

## 2013-06-21 NOTE — Progress Notes (Signed)
Plan for im nail today Repeat debridement PSU interop consult Significant problem for leg Lovena Le spatial frame for backup

## 2013-06-21 NOTE — Anesthesia Postprocedure Evaluation (Signed)
  Anesthesia Post-op Note  Patient: [name]  Procedure(s) Performed: Procedure(s): INTRAMEDULLARY (IM) NAIL TIBIAL (Left) REMOVAL EXTERNAL FIXATION LEG (Left) APPLICATION OF A-CELL OF EXTREMITY (Left) APPLICATION OF WOUND VAC (Left)  Patient Location: PACU  Anesthesia Type:General  Level of Consciousness: awake and alert   Airway and Oxygen Therapy: Patient Spontanous Breathing  Post-op Pain: moderate  Post-op Assessment: Post-op Vital signs reviewed, Patient's Cardiovascular Status Stable and Respiratory Function Stable  Post-op Vital Signs: Reviewed  Filed Vitals:   06/21/13 1930  BP: 124/77  Pulse: 98  Temp:   Resp: 14    Complications: No apparent anesthesia complications

## 2013-06-21 NOTE — Anesthesia Preprocedure Evaluation (Addendum)
Anesthesia Evaluation  Patient identified by MRN, date of birth, ID band Patient awake    Reviewed: Allergy & Precautions, H&P , NPO status , Patient's Chart, lab work & pertinent test results  Airway Mallampati: I TM Distance: >3 FB Neck ROM: Full    Dental   Pulmonary          Cardiovascular hypertension, Pt. on medications     Neuro/Psych    GI/Hepatic   Endo/Other    Renal/GU      Musculoskeletal   Abdominal   Peds  Hematology   Anesthesia Other Findings   Reproductive/Obstetrics                          Anesthesia Physical Anesthesia Plan  ASA: III  Anesthesia Plan: General   Post-op Pain Management:    Induction: Intravenous  Airway Management Planned: Oral ETT  Additional Equipment:   Intra-op Plan:   Post-operative Plan: Extubation in OR  Informed Consent: I have reviewed the patients History and Physical, chart, labs and discussed the procedure including the risks, benefits and alternatives for the proposed anesthesia with the patient or authorized representative who has indicated his/her understanding and acceptance.     Plan Discussed with: CRNA and Surgeon  Anesthesia Plan Comments:         Anesthesia Quick Evaluation

## 2013-06-21 NOTE — Anesthesia Procedure Notes (Signed)
Procedure Name: Intubation Date/Time: 06/21/2013 2:57 PM Performed by: [employer], Sheron Nightingale Pre-anesthesia Checklist: Patient identified, Timeout performed, Emergency Drugs available, Suction available and Patient being monitored Patient Re-evaluated:Patient Re-evaluated prior to inductionOxygen Delivery Method: Circle system utilized Preoxygenation: Pre-oxygenation with 100% oxygen Intubation Type: IV induction Ventilation: Mask ventilation without difficulty Laryngoscope Size: Mac and 3 Grade View: Grade I Tube type: Oral Tube size: 7.0 mm Number of attempts: 1 Placement Confirmation: ETT inserted through vocal cords under direct vision,  breath sounds checked- equal and bilateral and positive ETCO2 Secured at: 20 cm Dental Injury: Teeth and Oropharynx as per pre-operative assessment

## 2013-06-21 NOTE — Progress Notes (Signed)
ANTICOAGULATION CONSULT NOTE - Initial Consult  Pharmacy Consult for Coumadin Indication: atrial fibrillation  Allergies  Allergen Reactions  . Atenolol     REACTION: sick  . Clarithromycin     REACTION: nausea  . Codeine Sulfate     REACTION: nausea  . Macrodantin Other (See Comments)    Long time ago - reaction unknown  . Percocet [Oxycodone-Acetaminophen] Nausea And Vomiting    Patient Measurements: Height: 5\' 3"  (160 cm) Weight: 187 lb (84.823 kg) IBW/kg (Calculated) : 52.4   Vital Signs: Temp: 97.6 F (36.4 C) (06/02 1930) Temp src: Oral (06/02 1316) BP: 124/77 mmHg (06/02 1930) Pulse Rate: 98 (06/02 1930)  Labs:  Recent Labs  06/19/13 0721 06/20/13 0646 06/21/13 0619 06/21/13 0847  HGB  --  13.7  --   --   HCT  --  38.5  --   --   PLT  --  224  --   --   LABPROT 17.7* 15.9* 15.3*  --   INR 1.50* 1.30 1.24  --   CREATININE  --  0.92  --  0.88    Estimated Creatinine Clearance: 48.3 ml/min (by C-G formula based on Cr of 0.88).   Medical History: Past Medical History  Diagnosis Date  . Paroxysmal atrial fibrillation      on coumadin therapy  . Hypertension   . LBP (low back pain)     severe lumbar spondylosis s/p L3/L4,L4/L5 fusion 12/10  . OA (osteoarthritis)     Dr. Cay Schillings  . Osteopenia   . Elevated MCV   . Constipation     ischemic colitis with rectal bleeding   . B12 deficiency     boderline  . Renal insufficiency   . Hypothyroidism 2011  . NSAID-associated gastropathy 2011    with bleed  . Iron deficiency anemia 2011    elevated MCV  . Peptic ulcer disease     Medications:  Prescriptions prior to admission  Medication Sig Dispense Refill  . acetaminophen (TYLENOL) 500 MG tablet Take 500 mg by mouth daily as needed for pain.      [name] atorvastatin (LIPITOR) 10 MG tablet Take 10 mg by mouth every evening.      . B Complex-C (B-COMPLEX WITH VITAMIN C) tablet Take 1 tablet by mouth daily.      . Cholecalciferol (VITAMIN D3) 1000  UNITS tablet Take 1,000 Units by mouth daily.        . clobetasol cream (TEMOVATE) 1.75 % Apply 1 application topically 2 (two) times daily as needed (itching). As directed      . diltiazem (CARDIZEM CD) 120 MG 24 hr capsule Take 1 capsule (120 mg total) by mouth daily.  90 capsule  1  . Ferrous Sulfate (IRON) 325 (65 FE) MG TABS Take 1 tablet by mouth daily.      . furosemide (LASIX) 40 MG tablet Take 40 mg by mouth 2 (two) times daily.      [name] levothyroxine (SYNTHROID, LEVOTHROID) 25 MCG tablet Take 25 mcg by mouth daily before breakfast.      . metoprolol succinate (TOPROL-XL) 50 MG 24 hr tablet Take 1 tablet (50 mg total) by mouth 2 (two) times daily. Take with or immediately following a meal.  180 tablet  1  . pantoprazole (PROTONIX) 40 MG tablet Take 1 tablet (40 mg total) by mouth daily before breakfast.  90 tablet  3  . potassium chloride SA (K-DUR,KLOR-CON) 20 MEQ tablet Take 1 tablet (20 mEq total)  by mouth daily.  90 tablet  1  . temazepam (RESTORIL) 30 MG capsule TAKE ONE CAPSULE AT BEDTIME AS NEEDED  30 capsule  5  . vitamin C (ASCORBIC ACID) 500 MG tablet Take 500 mg by mouth daily. Take with iron dose      . warfarin (COUMADIN) 5 MG tablet Take 2.5-5 mg by mouth See admin instructions. Take 2.5 mg by mouth each night, except on Monday and Friday take 5 mg by mouth at night.       Scheduled:  . antiseptic oral rinse  15 mL Mouth Rinse BID  . atorvastatin  10 mg Oral QPM  . B-complex with vitamin C  1 tablet Oral Daily  .  ceFAZolin (ANCEF) IV  2 g Intravenous 3 times per day  . cholecalciferol  1,000 Units Oral Daily  . diltiazem  120 mg Oral Daily  . docusate sodium  100 mg Oral BID  . [START ON 06/22/2013] enoxaparin (LOVENOX) injection  30 mg Subcutaneous Q24H  . fentaNYL      . fentaNYL      . ferrous sulfate  325 mg Oral Q breakfast  . furosemide  40 mg Oral BID  . levothyroxine  25 mcg Oral QAC breakfast  . metoprolol succinate  50 mg Oral BID  . morphine      .  pantoprazole  40 mg Oral QAC breakfast  . potassium chloride SA  20 mEq Oral Daily  . temazepam  15 mg Oral QHS  . vitamin C  500 mg Oral Daily   Infusions:  . dextrose 5 % and 0.9 % NaCl with KCl 40 mEq/L 50 mL/hr at 06/21/13 1233    Assessment: 80 y.o female on coumadin PTA for afib who is s/p IM nail left tibia tonight. She has been on lovenox prophylaxis 40 mg SQ q24h since 5/30, s/p debridement of open L tib-fib fx and wound VAC and abx bead placement on 06/17/13. Her last dose of coumadin was taken on 06/15/13. Her home dose of coumadin was 2.5 mg daily except 5 mg every Monday and Friday.  INR today = 1.24 , CBC stable on 06/20/13. Lovenox to resume tomorrow at dose of 30 mg SQ q24h , SCDs on.     Goal of Therapy:  INR 2-3 Monitor platelets by anticoagulation protocol: Yes   Plan:  Coumadin 5mg  tonight x1 PT/INR daily.   Nicole Cella, RPh Clinical Pharmacist Pager: [phone]  06/21/2013,9:08 PM

## 2013-06-21 NOTE — Brief Op Note (Signed)
06/16/2013 - 06/21/2013  5:27 PM  PATIENT:  [name]  80 y.o. female  PRE-OPERATIVE DIAGNOSIS:  Left Tibia Ex-Fix  POST-OPERATIVE DIAGNOSIS:  Left Tibia Ex-Fix  PROCEDURE:  Procedure(s): INTRAMEDULLARY (IM) NAIL TIBIAL REMOVAL EXTERNAL FIXATION LEG debridement of open fracture  SURGEON:  Marlou Sa md ASSISTANT: Vernon pa  ANESTHESIA:   general  EBL: 100 ml    Total I/O In: 1000 [I.V.:1000] Out: 550 [Urine:550]  BLOOD ADMINISTERED: none  DRAINS: none   LOCAL MEDICATIONS USED:  none  SPECIMEN:  No Specimen  COUNTS:  YES  TOURNIQUET:    DICTATION: .Other Dictation: Dictation Number [id number]  PLAN OF CARE: Admit to inpatient   PATIENT DISPOSITION:  PACU - hemodynamically stable

## 2013-06-21 NOTE — Op Note (Signed)
Operative Note   DATE OF OPERATION: 06/21/2013  LOCATION: [facility]  SURGICAL DIVISION: Plastic Surgery  PREOPERATIVE DIAGNOSES:  Left leg wound secondary to open fracture  POSTOPERATIVE DIAGNOSES:  same  PROCEDURE:  Preparation of left leg wound for placement of Acell (powder 1 gm and sheet 7 x 10), VAC and partial primary closure.  SURGEON: Theodoro Kos, DO  ANESTHESIA:  General.   COMPLICATIONS: None.   INDICATIONS FOR PROCEDURE:  The patient, [name], is a 80 y.o. female born on 1925/07/16, is here for treatment of a open left leg tibial fracture wound.   CONSENT:  Informed consent was obtained directly from the patient. Risks, benefits and alternatives were fully discussed. Specific risks including but not limited to bleeding, infection, hematoma, seroma, scarring, pain, implant infection, implant extrusion, capsular contracture, asymmetry, wound healing problems, and need for further surgery were all discussed. The patient did have an ample opportunity to have questions answered to satisfaction.   DESCRIPTION OF PROCEDURE:  The patient was taken to the operating room. SCDs was placed on right leg. The patient's operative site was prepped and draped in a sterile fashion. A time out was performed and all information was confirmed to be correct.  General anesthesia was administered.  Ortho performed a repair of the fracture.  Once done, the patient was rendered to the plastic surgery team.  The edges of the laceration were debrided sharply with a #10 blade.  The area was irrigated.  The proximal most portion was closed with 2-0 Nylon.  The distal portion before the medial turn was closed with 2-0 Nylon.  The distal lateral incision was closed with 3-0 Monocryl.  The Acell powder was placed followed by the Acell sheet and secured with 5-0 Vicryl.  Adaptic was placed with a VAC and set at 75 mmHg pressure. A plantar blocking splint was applied.  The patient tolerated the procedure  well.  There were no complications. The patient was allowed to wake from anesthesia, extubated and taken to the recovery room in satisfactory condition.

## 2013-06-21 NOTE — Progress Notes (Signed)
PATIENT DETAILS Name: [name] Age: 80 y.o. Sex: female Date of Birth: Jun 03, 1925 Admit Date: 06/16/2013 Admitting Physician Meredith Pel, MD [id number] [name], MD  Subjective: No major complaints-daughter at bedisde  Assessment/Plan: A.Fib  - Currently Rate controlled, on Metorpolol 50 bid+  Cardizem 120 CD, as needed IV Lopressor has been ordered -coumadin transitioned to Lovenox [held today for surgery]  Hypothyroidism.  -Home dose Synthroid to be continued unchanged.   Dyslipidemia.  -On Lipitor 10 mg continue.   Chronic diastolic dysfunction - EF 60% on echogram in 2012. Compensated.  -hold Lasix 6/2 as NPO for procedure and start fluids 50 cc/hr  Hypokalemia- -2/2 to Lasix probably -continue Oral replacement Started d5/lr with K 6/2 -consider d/c in am  Mechanical fall with left Open tibial fracture  -per primary primary team which is orthopedics. -Cbc ordered for am  Disposition: Remain inpatient  DVT Prophylaxis: Prophylactic Lovenox  Code Status: Full code   Family Communication none  Procedures:  None  CONSULTS:  None  MEDICATIONS: Scheduled Meds: . antiseptic oral rinse  15 mL Mouth Rinse BID  . atorvastatin  10 mg Oral QPM  . B-complex with vitamin C  1 tablet Oral Daily  . cholecalciferol  1,000 Units Oral Daily  . diltiazem  120 mg Oral Daily  . docusate sodium  100 mg Oral BID  . ferrous sulfate  325 mg Oral Q breakfast  . furosemide  40 mg Oral BID  . levothyroxine  25 mcg Oral QAC breakfast  . metoprolol succinate  50 mg Oral BID  . pantoprazole  40 mg Oral QAC breakfast  . potassium chloride SA  20 mEq Oral Daily  . temazepam  15 mg Oral QHS  . vitamin C  500 mg Oral Daily   Continuous Infusions:  PRN Meds:.acetaminophen, HYDROcodone-acetaminophen, metoprolol, morphine injection, ondansetron (ZOFRAN) IV  Antibiotics: Anti-infectives   Start     Dose/Rate Route Frequency Ordered Stop   06/17/13 0400   ceFAZolin (ANCEF) IVPB 2 g/50 mL premix     2 g 100 mL/hr over 30 Minutes Intravenous Every 6 hours 06/17/13 0225 06/19/13 2309   06/16/13 2315  gentamicin (GARAMYCIN) injection 120 mg     120 mg Intramuscular  Once 06/16/13 2304 06/17/13 0001   06/16/13 2315  vancomycin (VANCOCIN) powder 500 mg     500 mg Other To Surgery 06/16/13 2304 06/17/13 0002   06/16/13 2100  ceFAZolin (ANCEF) IVPB 1 g/50 mL premix     1 g 100 mL/hr over 30 Minutes Intravenous  Once 06/16/13 2048 06/16/13 2206   06/16/13 2030  ceFAZolin (ANCEF) IVPB 1 g/50 mL premix     1 g 100 mL/hr over 30 Minutes Intravenous  Once 06/16/13 2030 06/16/13 2136       PHYSICAL EXAM: Vital signs in last 24 hours: Filed Vitals:   06/20/13 1345 06/20/13 2130 06/21/13 0525 06/21/13 0830  BP: 106/71 118/79 138/82 128/84  Pulse: 84 77 79 91  Temp: 97.4 F (36.3 C) 98 F (36.7 C) 97.5 F (36.4 C) 98.6 F (37 C)  TempSrc: Oral Oral Oral Oral  Resp: 16 16 16 16   Height:      Weight:      SpO2: 95% 94% 95% 89%    Weight change:  Filed Weights   06/17/13 0223  Weight: 84.823 kg (187 lb)   Body mass index is 33.13 kg/(m^2).   Gen Exam: Awake and alert with clear  speech.   Neck: Supple, No JVD.   Chest: B/L Clear.   CVS: S1 S2 irregular, no murmurs.  Abdomen: soft, BS +, non tender, non distended.  Extremities:left foot in compressive bandage with a drain in place-external fixator in place as well   Intake/Output from previous day:  Intake/Output Summary (Last 24 hours) at 06/21/13 1137 Last data filed at 06/21/13 0900  Gross per 24 hour  Intake      0 ml  Output      0 ml  Net      0 ml     LAB RESULTS: CBC  Recent Labs Lab 06/16/13 2033 06/17/13 0803 06/20/13 0646  WBC 6.5 9.5 7.3  HGB 14.4 13.5 13.7  HCT 42.6 39.5 38.5  PLT 203 187 224  MCV 107.8* 107.6* 110.0*  MCH 36.5* 36.8* 39.1*  MCHC 33.8 34.2 35.6  RDW 12.7 12.7 12.5  LYMPHSABS 1.5  --   --   MONOABS 0.9  --   --   EOSABS 0.1  --   --    BASOSABS 0.0  --   --     Chemistries   Recent Labs Lab 06/16/13 2033 06/17/13 0803 06/20/13 0646 06/21/13 0847  NA 139 137 137 136*  K 3.6* 3.9 3.2* 3.0*  CL 101 97 88* 89*  CO2 23 26 37* 36*  GLUCOSE 88 137* 106* 111*  BUN 13 8 14 16   CREATININE 1.10 0.73 0.92 0.88  CALCIUM 8.8 8.4 8.7 9.3  MG 1.8  --   --  1.5    CBG: No results found for this basename: GLUCAP,  in the last 168 hours  GFR Estimated Creatinine Clearance: 48.3 ml/min (by C-G formula based on Cr of 0.88).  Coagulation profile  Recent Labs Lab 06/17/13 0803 06/18/13 0637 06/19/13 0721 06/20/13 0646 06/21/13 0619  INR 2.39* 1.86* 1.50* 1.30 1.24    Cardiac Enzymes No results found for this basename: CK, CKMB, TROPONINI, MYOGLOBIN,  in the last 168 hours  No components found with this basename: POCBNP,  No results found for this basename: DDIMER,  in the last 72 hours No results found for this basename: HGBA1C,  in the last 72 hours No results found for this basename: CHOL, HDL, LDLCALC, TRIG, CHOLHDL, LDLDIRECT,  in the last 72 hours No results found for this basename: TSH, T4TOTAL, FREET3, T3FREE, THYROIDAB,  in the last 72 hours No results found for this basename: VITAMINB12, FOLATE, FERRITIN, TIBC, IRON, RETICCTPCT,  in the last 72 hours No results found for this basename: LIPASE, AMYLASE,  in the last 72 hours  Urine Studies No results found for this basename: UACOL, UAPR, USPG, UPH, UTP, UGL, UKET, UBIL, UHGB, UNIT, UROB, ULEU, UEPI, UWBC, URBC, UBAC, CAST, CRYS, UCOM, BILUA,  in the last 72 hours  MICROBIOLOGY: Recent Results (from the past 240 hour(s))  SURGICAL PCR SCREEN     Status: None   Collection Time    06/21/13  8:14 AM      Result Value Ref Range Status   MRSA, PCR NEGATIVE  NEGATIVE Final   Staphylococcus aureus NEGATIVE  NEGATIVE Final   Comment:            The Xpert SA Assay (FDA     approved for NASAL specimens     in patients over 55 years of age),     is one  component of     a comprehensive surveillance     program.  Test performance has  been validated by [facility] for patients greater     than or equal to 24 year old.     It is not intended     to diagnose infection nor to     guide or monitor treatment.    RADIOLOGY STUDIES/RESULTS: Dg Tibia/fibula Left  06/17/2013   CLINICAL DATA:  External fixation of left ankle fracture.  EXAM: DG C-ARM 61-120 MIN; LEFT TIBIA AND FIBULA - 2 VIEW  COMPARISON:  Left tibia/ fibula radiographs performed 06/16/2013  FINDINGS: Three fluoroscopic C-arm images are provided from the OR. These demonstrate external fixation screws transfixing the distal tibial diaphysis just above the level of the fracture. Apparent packing material is noted at the fracture site. The comminuted tibial and fibular fractures are again seen. These demonstrate mildly improved alignment in comparison to the prior study.  IMPRESSION: Status post external fixation of distal tibial fracture. Comminuted tibial and fibular fractures demonstrate mildly improved alignment.   Electronically Signed   By: Garald Balding M.D.   On: 06/17/2013 03:00   Dg Chest Portable 1 View  06/16/2013   CLINICAL DATA:  Leg pain post fall  EXAM: PORTABLE CHEST - 1 VIEW  COMPARISON:  Portable exam at 2102 hrs compared to 06/15/2012  FINDINGS: Enlargement of cardiac silhouette.  Mediastinal contours and pulmonary vascularity normal.  Numerous EKG leads project over chest particularly RIGHT middle lobe.  Chronic subsegmental atelectasis versus scarring in RIGHT middle lobe, superimposed with EKG leads, better visualized on previous exam.  No definite acute infiltrate, pleural effusion or pneumothorax.  Bones demineralized.  IMPRESSION: Enlargement of cardiac silhouette.  Chronic subsegmental atelectasis versus scarring in RIGHT middle lobe.   Electronically Signed   By: Lavonia Dana M.D.   On: 06/16/2013 21:35   Dg Tibia/fibula Left Port  06/16/2013   CLINICAL DATA:   Status post fall; left lower leg pain and open deformity.  EXAM: PORTABLE LEFT TIBIA AND FIBULA - 2 VIEW  COMPARISON:  None.  FINDINGS: There are comminuted fractures involving the distal tibial and fibular diaphyses, with one shaft width anterior and lateral displacement of the distal tibia, shortening at the fracture site, and more than one shaft width lateral displacement of the distal fibula. Scattered soft tissue air is seen, reflecting the open nature of the fracture, with marked associated soft tissue disruption.  The ankle mortise appears grossly intact. The distal interosseous space is grossly preserved.  There is also a mildly comminuted fracture involving the proximal fibular diaphysis, with associated soft tissue swelling. Given the degree of soft tissue injury, would follow up to ensure that no compartment syndrome or rhabdomyolysis develops.  No significant knee joint effusion is identified. There is mild cortical irregularity along the articular surface of the patella. Scattered vascular calcifications are seen.  IMPRESSION: 1. Comminuted fractures involving the distal tibial and fibular diaphyses, with one shaft width anterior and lateral displacement of the distal tibia and shortening at the fracture site, and more than one shaft width lateral displacement of the distal fibula. This is an open fracture, with marked soft tissue disruption and scattered soft tissue air. 2. Mildly comminuted fracture involving the proximal fibular diaphysis, with associated soft tissue swelling. 3. Given the degree of soft tissue injury, would follow up to ensure that no compartment syndrome or rhabdomyolysis develops.   Electronically Signed   By: Garald Balding M.D.   On: 06/16/2013 21:35   Dg Ankle Left Port  06/17/2013   CLINICAL DATA:  LEFT ankle fracture post external fixation  EXAM: PORTABLE LEFT ANKLE - 2 VIEW  COMPARISON:  Intraoperative images 06/17/2013  FINDINGS: External fixation device has been  applied.  Distal RIGHT tibial fracture identified displaced medially and slightly anteriorly.  Displaced fibular diaphyseal fracture identified displaced medially and slightly anteriorly.  Ankle joint alignment appears normal.  Bones appear slightly demineralized.  Wound VAC and antibiotic beads noted.  IMPRESSION: Postoperative changes from external fixation as above.   Electronically Signed   By: Lavonia Dana M.D.   On: 06/17/2013 03:05   Dg Foot 2 Views Left  06/16/2013   CLINICAL DATA:  Status post fall; left lower leg pain and open deformity.  EXAM: LEFT FOOT - 2 VIEW  COMPARISON:  None.  FINDINGS: The comminuted and displaced fractures through the distal diaphyses of the tibia and fibula are better characterized on concurrent tibia/fibula radiographs.  No additional fractures are seen. Visualized joint spaces are grossly preserved. The subtalar joint is unremarkable in appearance. Tiny plantar and posterior calcaneal spurs are noted.  Marked soft tissue disruption is noted about the distal lower leg, with prominent soft tissue air.  IMPRESSION: 1. Comminuted displaced fractures through the distal diaphyses of the tibia and fibula are better characterized on concurrent tibia/fibula radiographs. 2. No additional fractures seen. 3. Marked soft tissue disruption about the distal lower leg, with prominent soft tissue air.   Electronically Signed   By: Garald Balding M.D.   On: 06/16/2013 21:37   Dg C-arm 1-60 Min  06/17/2013   CLINICAL DATA:  External fixation of left ankle fracture.  EXAM: DG C-ARM 61-120 MIN; LEFT TIBIA AND FIBULA - 2 VIEW  COMPARISON:  Left tibia/ fibula radiographs performed 06/16/2013  FINDINGS: Three fluoroscopic C-arm images are provided from the OR. These demonstrate external fixation screws transfixing the distal tibial diaphysis just above the level of the fracture. Apparent packing material is noted at the fracture site. The comminuted tibial and fibular fractures are again seen.  These demonstrate mildly improved alignment in comparison to the prior study.  IMPRESSION: Status post external fixation of distal tibial fracture. Comminuted tibial and fibular fractures demonstrate mildly improved alignment.   Electronically Signed   By: Garald Balding M.D.   On: 06/17/2013 03:00   Verneita Griffes, MD [facility] [phone]  **Disclaimer: This note may have been dictated with voice recognition software. Similar sounding words can inadvertently be transcribed and this note may contain transcription errors which may not have been corrected upon publication of note.**

## 2013-06-21 NOTE — Progress Notes (Addendum)
Attempted to call Short Stay to give report x2 - ([phone], Suanne Marker gave me the number). No answer. Kenichi Cassada D Vallarie Mare

## 2013-06-21 NOTE — Transfer of Care (Signed)
Immediate Anesthesia Transfer of Care Note  Patient: [name]  Procedure(s) Performed: Procedure(s): INTRAMEDULLARY (IM) NAIL TIBIAL (Left) REMOVAL EXTERNAL FIXATION LEG (Left) APPLICATION OF A-CELL OF EXTREMITY (Left) APPLICATION OF WOUND VAC (Left)  Patient Location: PACU  Anesthesia Type:General  Level of Consciousness: awake, alert  and oriented  Airway & Oxygen Therapy: Patient Spontanous Breathing and Patient connected to nasal cannula oxygen  Post-op Assessment: Report given to PACU RN and Post -op Vital signs reviewed and stable  Post vital signs: Reviewed and stable  Complications: No apparent anesthesia complications

## 2013-06-22 ENCOUNTER — Encounter (HOSPITAL_COMMUNITY): Payer: Self-pay | Admitting: Orthopedic Surgery

## 2013-06-22 DIAGNOSIS — K5900 Constipation, unspecified: Secondary | ICD-10-CM

## 2013-06-22 DIAGNOSIS — E876 Hypokalemia: Secondary | ICD-10-CM

## 2013-06-22 LAB — COMPREHENSIVE METABOLIC PANEL
ALT: 9 U/L (ref 0–35)
AST: 27 U/L (ref 0–37)
Albumin: 3.2 g/dL — ABNORMAL LOW (ref 3.5–5.2)
Alkaline Phosphatase: 67 U/L (ref 39–117)
BUN: 16 mg/dL (ref 6–23)
CO2: 32 mEq/L (ref 19–32)
Calcium: 9.1 mg/dL (ref 8.4–10.5)
Chloride: 92 mEq/L — ABNORMAL LOW (ref 96–112)
Creatinine, Ser: 0.85 mg/dL (ref 0.50–1.10)
GFR calc Af Amer: 70 mL/min — ABNORMAL LOW (ref >90)
GFR calc non Af Amer: 61 mL/min — ABNORMAL LOW (ref >90)
Glucose, Bld: 135 mg/dL — ABNORMAL HIGH (ref 70–99)
Potassium: 3.3 mEq/L — ABNORMAL LOW (ref 3.7–5.3)
Sodium: 139 mEq/L (ref 137–147)
Total Bilirubin: 1.5 mg/dL — ABNORMAL HIGH (ref 0.3–1.2)
Total Protein: 7.4 g/dL (ref 6.0–8.3)

## 2013-06-22 LAB — CBC
HCT: 40 % (ref 36.0–46.0)
Hemoglobin: 13.6 g/dL (ref 12.0–15.0)
MCH: 36.7 pg — ABNORMAL HIGH (ref 26.0–34.0)
MCHC: 34 g/dL (ref 30.0–36.0)
MCV: 107.8 fL — ABNORMAL HIGH (ref 78.0–100.0)
Platelets: 261 10*3/uL (ref 150–400)
RBC: 3.71 MIL/uL — ABNORMAL LOW (ref 3.87–5.11)
RDW: 12.5 % (ref 11.5–15.5)
WBC: 8.1 10*3/uL (ref 4.0–10.5)

## 2013-06-22 LAB — PROTIME-INR
INR: 1.1 (ref 0.00–1.49)
Prothrombin Time: 14 seconds (ref 11.6–15.2)

## 2013-06-22 LAB — TSH: TSH: 0.65 u[IU]/mL (ref 0.350–4.500)

## 2013-06-22 MED ORDER — MAGNESIUM CITRATE PO SOLN
1.0000 | Freq: Once | ORAL | Status: AC
  Administered 2013-06-22: 1 via ORAL
  Filled 2013-06-22: qty 296

## 2013-06-22 MED ORDER — BISACODYL 10 MG RE SUPP
10.0000 mg | Freq: Every day | RECTAL | Status: DC | PRN
  Administered 2013-06-22: 10 mg via RECTAL
  Filled 2013-06-22 (×2): qty 1

## 2013-06-22 MED ORDER — POLYETHYLENE GLYCOL 3350 17 G PO PACK
17.0000 g | PACK | Freq: Every day | ORAL | Status: DC
  Administered 2013-06-22 – 2013-06-25 (×3): 17 g via ORAL
  Filled 2013-06-22 (×5): qty 1

## 2013-06-22 MED ORDER — POTASSIUM CHLORIDE CRYS ER 20 MEQ PO TBCR
40.0000 meq | EXTENDED_RELEASE_TABLET | Freq: Every day | ORAL | Status: DC
  Administered 2013-06-23 – 2013-06-25 (×3): 40 meq via ORAL
  Filled 2013-06-22 (×4): qty 2

## 2013-06-22 MED ORDER — WARFARIN SODIUM 5 MG PO TABS
5.0000 mg | ORAL_TABLET | Freq: Once | ORAL | Status: AC
  Administered 2013-06-22: 5 mg via ORAL
  Filled 2013-06-22: qty 1

## 2013-06-22 MED ORDER — POTASSIUM CHLORIDE CRYS ER 20 MEQ PO TBCR
40.0000 meq | EXTENDED_RELEASE_TABLET | Freq: Once | ORAL | Status: AC
  Administered 2013-06-22: 40 meq via ORAL
  Filled 2013-06-22: qty 2

## 2013-06-22 MED ORDER — FLEET ENEMA 7-19 GM/118ML RE ENEM: 1.0000 | ENEMA | Freq: Every day | RECTAL | Status: DC | PRN

## 2013-06-22 NOTE — Progress Notes (Signed)
Utilization review completed.  

## 2013-06-22 NOTE — Progress Notes (Signed)
Orthopedic Tech Progress Note Patient Details:  [name] [birth year] [id number]  CPM Left Knee CPM Left Knee: On Left Knee Flexion (Degrees): 30 Left Knee Extension (Degrees): 0   Theodoro Parma Cammer 06/22/2013, 9:45 AM

## 2013-06-22 NOTE — Progress Notes (Signed)
Subjective: Pt stable pain ok   Objective: Vital signs in last 24 hours: Temp:  [97.4 F (36.3 C)-98.6 F (37 C)] 98.3 F (36.8 C) (06/03 0551) Pulse Rate:  [62-124] 91 (06/03 0551) Resp:  [12-23] 16 (06/03 0551) BP: (102-151)/(59-100) 102/61 mmHg (06/03 0551) SpO2:  [89 %-100 %] 100 % (06/03 0551)  Intake/Output from previous day: 06/02 0701 - 06/03 0700 In: 3282.5 [P.O.:460; I.V.:2822.5] Out: 1725 [Urine:1675; Drains:50] Intake/Output this shift:    Exam:  Dorsiflexion/Plantar flexion intact Compartment soft  Labs:  Recent Labs  06/20/13 0646 06/21/13 2221 06/22/13 0429  HGB 13.7 13.8 13.6    Recent Labs  06/21/13 2221 06/22/13 0429  WBC 11.4* 8.1  RBC 3.63* 3.71*  HCT 39.8 40.0  PLT 254 261    Recent Labs  06/21/13 0847 06/21/13 2221 06/22/13 0429  NA 136*  --  139  K 3.0*  --  3.3*  CL 89*  --  92*  CO2 36*  --  32  BUN 16  --  16  CREATININE 0.88 0.87 0.85  GLUCOSE 111*  --  135*  CALCIUM 9.3  --  9.1    Recent Labs  06/21/13 0619 06/22/13 0429  INR 1.24 1.10    Assessment/Plan: Plan nwb - cpm today for knee 0 - 30 1 hour per 8   [employer] 06/22/2013, 8:20 AM

## 2013-06-22 NOTE — Progress Notes (Signed)
Physical Therapy Treatment Patient Details Name: [name] MRN: [id number] DOB: [birth year] Today's Date: 06/22/2013    History of Present Illness 80 y.o. female admitted with an open distal tib-fib fracture, s/p external fixation removed and IM performed on 06/21/13. NWB on LLE.    PT Comments    Patient agreeable to OOB to chair however continues to be limited by overall anxiety. Patient very nervous about falling and pain. Patient needed +3 to maintain NWB on her L leg. Patient complained of feeling as if she was going to faint when we made it to the chair. Vitals taken and all were [facility]. Continue to recommend SNF for ongoing Physical Therapy.     Follow Up Recommendations  SNF     Equipment Recommendations  Rolling walker with 5" wheels;Wheelchair (measurements PT)    Recommendations for Other Services       Precautions / Restrictions Precautions Precautions: Fall Restrictions Weight Bearing Restrictions: Yes LLE Weight Bearing: Non weight bearing    Mobility  Bed Mobility Overal bed mobility: Needs Assistance Bed Mobility: Supine to Sit     Supine to sit: Mod assist     General bed mobility comments: assist to support LLE, cues for hand placement/technique, patient able to assist with trunk control but needed assist to bring body to EOB  Transfers Overall transfer level: Needs assistance Equipment used: 2 person hand held assist Transfers: Stand Pivot Transfers   Stand pivot transfers: Max assist;+2 physical assistance;+2 safety/equipment       General transfer comment: assist for all aspects of transfer and 3rd person to assist to maintain NWB. Patient very anxious throughout transfer.  Ambulation/Gait                 Stairs            Wheelchair Mobility    Modified Rankin (Stroke Patients Only)       Balance Overall balance assessment: Needs assistance Sitting-balance support: Single extremity supported;Feet supported Sitting  balance-Leahy Scale: Fair Sitting balance - Comments: Patient able to sit EOB today ~15 min while working on ADLs with OT and trunk control and support with PT. Patient showing improved sitting tolerance from previous session   Standing balance support: Bilateral upper extremity supported Standing balance-Leahy Scale: Zero                      Cognition Arousal/Alertness: Awake/alert Behavior During Therapy: WFL for tasks assessed/performed;Anxious Overall Cognitive Status: Within Functional Limits for tasks assessed                      Exercises      General Comments        Pertinent Vitals/Pain 5/10 L leg pain. patient repositioned for comfort     Home Living                      Prior Function            PT Goals (current goals can now be found in the care plan section) Acute Rehab PT Goals Patient Stated Goal: to walk Progress towards PT goals: Progressing toward goals    Frequency  Min 3X/week    PT Plan Current plan remains appropriate    Co-evaluation PT/OT/SLP Co-Evaluation/Treatment: Yes Reason for Co-Treatment: For patient/therapist safety PT goals addressed during session: Mobility/safety with mobility;Strengthening/ROM OT goals addressed during session: Strengthening/ROM;ADL's and self-care (functional mobility)     End of Session  Equipment Utilized During Treatment: Gait belt Activity Tolerance: Patient limited by fatigue;Patient limited by pain Patient left: in chair;with call bell/phone within reach     Time: 3151-7616 PT Time Calculation (min): 40 min  Charges:  $Therapeutic Activity: 23-37 mins                    G Codes:      Tonia Brooms Dacoda Spallone 06/22/2013, 1:09 PM  06/22/2013 Tonia Brooms Ryla Cauthon PTA [phone] pager [phone] office

## 2013-06-22 NOTE — Progress Notes (Signed)
Patient with difficult time having a bowel movement, last bowel movement was 5/28.  Spoke with Dr. Marlou Sa, new order for Miralax daily.  Miralax given without positive effect.  Prune juice given, without effect.  Spoke with Dr. Marlou Sa, new order for a bottle of mag citrate x1, dulcolax suppository daily PRN, and fleets enema daily PRN.  Mag citrate and dulcolax suppository given.  Nursing will continue to monitor.

## 2013-06-22 NOTE — Progress Notes (Signed)
Agree with note. 06/22/2013 Leonel Mccollum, OTR/L Pager: [phone] 

## 2013-06-22 NOTE — Op Note (Signed)
NAME:  [name], [name] NO.:  [id number]  MEDICAL RECORD NO.:  [id number]  LOCATION:  [id number]                        FACILITY:  [facility]  PHYSICIAN:  Anderson Malta, M.D.    DATE OF BIRTH:  11-Jul-1925  DATE OF PROCEDURE: DATE OF DISCHARGE:                              OPERATIVE REPORT   PREOPERATIVE DIAGNOSIS:  Open left tib-fib fracture.  POSTOPERATIVE DIAGNOSIS:  Open left tib-fib fracture.  PROCEDURE:  Removal of external fixator, debridement of open fracture, intramedullary nailing of left tib-fib fracture.  SURGEON:  Anderson Malta, M.D.  ASSISTANT:  Phillips Hay, PA.  INDICATIONS:  [name] is a patient now several days out from left open tib-fib fracture, presents now for operative management, removal of external fixator, Plastic Surgery consultation, and procedure to obtain wound coverage after explanation of risks and benefits.  PROCEDURE IN DETAIL:  The patient was brought to the operating room, where general anesthetic was induced.  Preoperative antibiotics administered.  Time-out was called.  Left leg was prepped with Hibiclens and saline, draped in a sterile manner.  Prior sutures removed from the open fracture portion.  The calcaneal pin was left in place with traction.  Through the open fracture, the antibiotic beads were removed. Thorough irrigation and excisional debridement performed.  At this time, an incision was made up at the medial aspect of the patellar tendon. Skin and subcutaneous tissue were sharply divided.  Guide pin was placed in the proximal aspect of the tibia.  The rod was then placed.  Smith and Nephew 11.5 mm x 35 down across the fracture site which did have some comminution.  Preop CT scan demonstrated no extension to the joint. Two proximal and 2 distal interlocking screws were placed.  Good fracture stability was achieved.  Rotational alignment was good.  There was slight valgus alignment at the fracture site, however,  overall the alignment was acceptable.  At this time, the 2 distal interlocking incisions they were actually at the free edges of the open fracture site.  Proximal incisions interlocking and nail incisions were irrigated and then closed on the proximal and the interlocking incisions with 3-0 Vicryl and 3-0 nylon.  Proximal interlocking, proximal nail insertion site through the medial aspect of the patella and closed using 0 Vicryl suture, 2-0 Vicryl suture, and 3-0 Vicryl.  The pin was released and was removed from the calcaneus.  Dr. Migdalia Dk then came in to evaluate the soft tissue envelope for coverage.  She took over the procedure at this time.  The patient tolerated the procedure well without immediate complications and will be transferred to the recovery room.  [name] Vernon's assistance was required during the case for opening and closing as well as, limb positioning, and nailing of her assistance was a medical necessity.     Anderson Malta, M.D.     GSD/MEDQ  D:  06/21/2013  T:  06/22/2013  Job:  [id number]

## 2013-06-22 NOTE — Progress Notes (Signed)
[facility] PROGRESS NOTE  [name] ZHY:[id number] DOB: [birth year] DOA: 06/16/2013 PCP: Walker Kehr, MD  Assessment/Plan: A.Fib  - Currently Rate controlled, on Metorpolol 50 bid and  Cardizem 120 mg CD, as needed IV Lopressor has been ordered  -coumadin resumed  Hypothyroidism.  -Home dose Synthroid   Dyslipidemia.  -On Lipitor 10 mg   Chronic diastolic dysfunction  - EF 60% on echogram in 2012. Compensated.  -resume lasix  Hypokalemia-  -replenish with daily kcl  Mechanical fall with left Open tibial fracture  S/p Removal of external fixator, debridement of open fracture,  intramedullary nailing of left tib-fib fracture on 6/2 . tolerated sx well. Pain control per primary team  constipation  on colace bid. Will ad dulcolax suppository   Disposition:  SNF per PT  DVT Prophylaxis:  Prophylactic Lovenox and coumadin   Code Status: full Family Communication: husband at bedside Disposition Plan: possibly SNF     HPI/Subjective: Pain over left leg stable,. No BM since admission  Objective: Filed Vitals:   06/22/13 1300  BP: 111/61  Pulse: 58  Temp: 98.2 F (36.8 C)  Resp: 16    Intake/Output Summary (Last 24 hours) at 06/22/13 1339 Last data filed at 06/22/13 1300  Gross per 24 hour  Intake 3882.5 ml  Output   2125 ml  Net 1757.5 ml   Filed Weights   06/17/13 0223  Weight: 84.823 kg (187 lb)    Exam:   General:  Elderly female in AND  HEENT: no pallor, moist mucosa  Chest: clear b/l, no added sounds   CVS: N S1&S2, no MRG  Abd: soft, NT, ND, BS+  Ext: warm, no edema  CNS: AAOX3  Data Reviewed: Basic Metabolic Panel:  Recent Labs Lab 06/16/13 2033 06/17/13 0803 06/20/13 0646 06/21/13 0847 06/21/13 2221 06/22/13 0429  NA 139 137 137 136*  --  139  K 3.6* 3.9 3.2* 3.0*  --  3.3*  CL 101 97 88* 89*  --  92*  CO2 23 26 37* 36*  --  32  GLUCOSE 88 137* 106* 111*  --  135*  BUN 13 8 14 16   --  16  CREATININE  1.10 0.73 0.92 0.88 0.87 0.85  CALCIUM 8.8 8.4 8.7 9.3  --  9.1  MG 1.8  --   --  1.5  --   --    Liver Function Tests:  Recent Labs Lab 06/16/13 2033 06/22/13 0429  AST 26 27  ALT 23 9  ALKPHOS 90 67  BILITOT 0.8 1.5*  PROT 7.4 7.4  ALBUMIN 3.7 3.2*   No results found for this basename: LIPASE, AMYLASE,  in the last 168 hours No results found for this basename: AMMONIA,  in the last 168 hours CBC:  Recent Labs Lab 06/16/13 2033 06/17/13 0803 06/20/13 0646 06/21/13 2221 06/22/13 0429  WBC 6.5 9.5 7.3 11.4* 8.1  NEUTROABS 4.0  --   --   --   --   HGB 14.4 13.5 13.7 13.8 13.6  HCT 42.6 39.5 38.5 39.8 40.0  MCV 107.8* 107.6* 110.0* 109.6* 107.8*  PLT 203 187 224 254 261   Cardiac Enzymes:  Recent Labs Lab 06/16/13 2033  CKTOTAL 101   BNP (last 3 results) No results found for this basename: PROBNP,  in the last 8760 hours CBG: No results found for this basename: GLUCAP,  in the last 168 hours  Recent Results (from the past 240 hour(s))  SURGICAL PCR SCREEN  Status: None   Collection Time    06/21/13  8:14 AM      Result Value Ref Range Status   MRSA, PCR NEGATIVE  NEGATIVE Final   Staphylococcus aureus NEGATIVE  NEGATIVE Final   Comment:            The Xpert SA Assay (FDA     approved for NASAL specimens     in patients over 21 years of age),     is one component of     a comprehensive surveillance     program.  Test performance has     been validated by [employer] for patients greater     than or equal to 47 year old.     It is not intended     to diagnose infection nor to     guide or monitor treatment.     Studies: Dg Tibia/fibula Left  06/21/2013   CLINICAL DATA:  Tibial fracture  EXAM: LEFT TIBIA AND FIBULA - 2 VIEW  COMPARISON:  06/20/2013  FINDINGS: An intra medullary rod has been placed through the tibia with improved positioning and alignment at the site of distal tibial fracture. Proximal fibular fracture not visualized. Distal  fibular fracture again demonstrates displacement laterally of the distal fracture fragment.  IMPRESSION: ORIF tibia fracture   Electronically Signed   By: Skipper Cliche M.D.   On: 06/21/2013 17:21   Ct Ankle Left Wo Contrast  06/20/2013   CLINICAL DATA:  Preop.  Evaluate distal fracture fragments.  EXAM: CT OF THE LEFT ANKLE WITHOUT CONTRAST  TECHNIQUE: Multidetector CT imaging was performed according to the standard protocol. Multiplanar CT image reconstructions were also generated.  COMPARISON:  Radiography from 2 days prior  FINDINGS: Recent open fracture of the distal leg. Antibiotic beads are present within the tibial fracture and within the anterior soft tissue incision/defect. The tibial fracture is through the distal diaphysis. The largest comminuted fragment is posterior and medial, measuring 4.5 cm in length. The main fracture plane, which is transverse, is displaced 25% laterally. No significant rotation. There is a separate fractures through the lateral aspect of the distal tibial epiphysis, extending to the joint, without offset.  Fibular fracture is segmental, with 20 cm between fractures. Comminution at the level of the proximal diaphysis and distal diaphysis fractures.  No hindfoot or midfoot fracture identified.  External fixation has been applied into the calcaneus and tibia. There is no fracture around the pins.  No evidence of fluid collection to suggest abscess. No gross tendon rupture or displacement. Arterial calcification. Osteopenia.  IMPRESSION: 1. Comminuted distal tibial diaphysis fracture with displacement described above. 2. Segmental fibular diaphysis fracture with comminution. 3. Tibial avulsion fracture at the anterior tibiofibular ligament attachment. No widening of the ankle mortise.   Electronically Signed   By: Jorje Guild M.D.   On: 06/20/2013 22:44   Dg Tibia/fibula Left Port  06/21/2013   CLINICAL DATA:  Fixation of tibia fractures.  EXAM: PORTABLE LEFT TIBIA AND  FIBULA - 2 VIEW  COMPARISON:  06/17/2013.  FINDINGS: There is an intra medullary rod in the tibia with 2 proximal and 2 distal interlocking screws. The rod is traversing the distal tibia fractures. Comminuted proximal and distal fibular fractures are again noted.  IMPRESSION: Well-positioned intramedullary rod in the tibia.   Electronically Signed   By: Kalman Jewels M.D.   On: 06/21/2013 20:39   Dg C-arm 1-60 Min  06/21/2013   CLINICAL  DATA:  Left leg surgery  EXAM: DG C-ARM 61-120 MIN  : COMPARISON:  06/20/2013  FINDINGS: ORIF tibial fracture with improved positioning and alignment at the fracture site.  IMPRESSION: ORIF tibial fracture   Electronically Signed   By: Skipper Cliche M.D.   On: 06/21/2013 17:22    Scheduled Meds: . antiseptic oral rinse  15 mL Mouth Rinse BID  . atorvastatin  10 mg Oral QPM  . B-complex with vitamin C  1 tablet Oral Daily  .  ceFAZolin (ANCEF) IV  2 g Intravenous 3 times per day  . cholecalciferol  1,000 Units Oral Daily  . diltiazem  120 mg Oral Daily  . docusate sodium  100 mg Oral BID  . enoxaparin (LOVENOX) injection  30 mg Subcutaneous Q24H  . ferrous sulfate  325 mg Oral Q breakfast  . furosemide  40 mg Oral BID  . levothyroxine  25 mcg Oral QAC breakfast  . metoprolol succinate  50 mg Oral BID  . pantoprazole  40 mg Oral QAC breakfast  . polyethylene glycol  17 g Oral Daily  . potassium chloride SA  20 mEq Oral Daily  . temazepam  15 mg Oral QHS  . vitamin C  500 mg Oral Daily  . warfarin  5 mg Oral ONCE-1800  . Warfarin - Pharmacist Dosing Inpatient   Does not apply q1800   Continuous Infusions: . dextrose 5 % and 0.9 % NaCl with KCl 40 mEq/L 50 mL/hr at 06/21/13 2148      Time spent: 25 minutes    Bernardine Langworthy  [facility] Pager [phone]. If 7PM-7AM, please contact night-coverage at [email or web address], password [facility] 06/22/2013, 1:39 PM  LOS: 6 days

## 2013-06-22 NOTE — Discharge Instructions (Signed)

## 2013-06-22 NOTE — Progress Notes (Signed)
ANTICOAGULATION CONSULT NOTE - Follow-up  Pharmacy Consult for Coumadin Indication: atrial fibrillation  Allergies  Allergen Reactions  . Atenolol     REACTION: sick  . Clarithromycin     REACTION: nausea  . Codeine Sulfate     REACTION: nausea  . Macrodantin Other (See Comments)    Long time ago - reaction unknown  . Percocet [Oxycodone-Acetaminophen] Nausea And Vomiting    Patient Measurements: Height: 5\' 3"  (160 cm) Weight: 187 lb (84.823 kg) IBW/kg (Calculated) : 52.4   Vital Signs: Temp: 98.3 F (36.8 C) (06/03 0551) Temp src: Oral (06/03 0551) BP: 105/69 mmHg (06/03 0939) Pulse Rate: 86 (06/03 0939)  Labs:  Recent Labs  06/20/13 0646 06/21/13 0619 06/21/13 0847 06/21/13 2221 06/22/13 0429  HGB 13.7  --   --  13.8 13.6  HCT 38.5  --   --  39.8 40.0  PLT 224  --   --  254 261  LABPROT 15.9* 15.3*  --   --  14.0  INR 1.30 1.24  --   --  1.10  CREATININE 0.92  --  0.88 0.87 0.85    Estimated Creatinine Clearance: 50 ml/min (by C-G formula based on Cr of 0.85).  Assessment: 80 y.o female on chronic coumadin PTA for afib, now s/p IM nail L tibia last night. Also s/p debridement of open L tib-fib fx and wound VAC and abx bead placement on 06/17/13. Coumadin was resumed post-op last night. INR is low as expected at 1.1. CBC is WNL, no bleeding noted.   Goal of Therapy:  INR 2-3   Plan:  1. Repeat warfarin 5mg  PO x 1 tonight 2. F/u AM INR  Salome Arnt, PharmD, BCPS Pager # [phone] 06/22/2013 10:10 AM

## 2013-06-22 NOTE — Progress Notes (Signed)
Occupational Therapy Treatment Patient Details Name: [name] MRN: [id number] DOB: 07/25/1925 Today's Date: 06/22/2013    History of present illness 80 y.o. female admitted with an open distal tib-fib fracture, s/p external fixation removed and IM performed on 06/21/13. NWB on LLE.   OT comments  The focus of today's treatment was on level 2 theraband UE exercises, sitting EOB for grooming, bed mobility and sit>stand from the bed to the chair to increase her ADL independence. Pt was anxious during treatment and afraid to be moved. Pt is slowly progressing towards her goals and will benefit from additional OT. D/c plan remains appropriate and will continue to follow.  Follow Up Recommendations  SNF;Supervision/Assistance - 24 hour    Equipment Recommendations   (TBD at next venue)    Recommendations for Other Services      Precautions / Restrictions Precautions Precautions: Fall Restrictions Weight Bearing Restrictions: Yes LLE Weight Bearing: Non weight bearing       Mobility Bed Mobility Overal bed mobility: Needs Assistance Bed Mobility: Supine to Sit     Supine to sit: Mod assist     General bed mobility comments: assist to support LLE, cues for hand placement/technique, patient able to assist with trunk control but needed assist to scoot body to EOB  Transfers Overall transfer level: Needs assistance Equipment used: 2 person hand held assist Transfers: Stand Pivot Transfers   Stand pivot transfers: Max assist;+2 physical assistance;+2 safety/equipment       General transfer comment: assist for all aspects of transfer and 3rd person to assist to maintain NWB. Patient very anxious throughout transfer.    Balance Overall balance assessment: Needs assistance Sitting-balance support: Single extremity supported;Feet supported Sitting balance-Leahy Scale: Fair     Standing balance support: Bilateral upper extremity supported Standing balance-Leahy Scale: Zero                     ADL Overall ADL's : Needs assistance/impaired     Grooming: Brushing hair;Set up;Sitting                               Functional mobility during ADLs: +2 for physical assistance;Maximal assistance (+3rd person to assist LLE and adhere to NWB status) General ADL Comments: Pt feeling "shakey' and has anxiety while sitting EOB.  Incr time for all tasks.                Cognition   Behavior During Therapy: [facility] for tasks assessed/performed;Anxious Overall Cognitive Status: Within Functional Limits for tasks assessed                                 Pertinent Vitals/ Pain       No c/o pain during treatment. BP measured 100/60 EOB and 110/54 after transfer. Pt c/o dizziness after transfer and was reclined with feet elevated and placed back on O2.         Frequency Min 2X/week     Progress Toward Goals  OT Goals(current goals can now be found in the care plan section)  Progress towards OT goals: Progressing toward goals  Acute Rehab OT Goals Patient Stated Goal: to walk OT Goal Formulation: With patient/family Time For Goal Achievement: 07/01/13 Potential to Achieve Goals: Good  Plan Discharge plan remains appropriate    Co-evaluation    PT/OT/SLP Co-Evaluation/Treatment: Yes Reason for Co-Treatment: For  patient/therapist safety   OT goals addressed during session: Strengthening/ROM;ADL's and self-care (functional mobility)         Activity Tolerance Patient limited by fatigue   Patient Left in chair;with call bell/phone within reach;with family/visitor present   Nurse Communication Mobility status;Need for lift equipment (use bed pan in chair and lift to bed)        Time:  -     Charges:    Lyda Perone 06/22/2013, 12:43 PM

## 2013-06-23 ENCOUNTER — Ambulatory Visit: Payer: Medicare Other | Admitting: Cardiovascular Disease

## 2013-06-23 LAB — BASIC METABOLIC PANEL
BUN: 12 mg/dL (ref 6–23)
CO2: 32 mEq/L (ref 19–32)
Calcium: 8.9 mg/dL (ref 8.4–10.5)
Chloride: 91 mEq/L — ABNORMAL LOW (ref 96–112)
Creatinine, Ser: 0.68 mg/dL (ref 0.50–1.10)
GFR calc Af Amer: 90 mL/min — ABNORMAL LOW (ref >90)
GFR calc non Af Amer: 78 mL/min — ABNORMAL LOW (ref >90)
Glucose, Bld: 148 mg/dL — ABNORMAL HIGH (ref 70–99)
Potassium: 3.6 mEq/L — ABNORMAL LOW (ref 3.7–5.3)
Sodium: 136 mEq/L — ABNORMAL LOW (ref 137–147)

## 2013-06-23 LAB — PROTIME-INR
INR: 1.58 — ABNORMAL HIGH (ref 0.00–1.49)
Prothrombin Time: 18.4 seconds — ABNORMAL HIGH (ref 11.6–15.2)

## 2013-06-23 MED ORDER — WARFARIN SODIUM 5 MG PO TABS
5.0000 mg | ORAL_TABLET | Freq: Once | ORAL | Status: AC
  Administered 2013-06-23: 5 mg via ORAL
  Filled 2013-06-23: qty 1

## 2013-06-23 MED ORDER — POTASSIUM CHLORIDE CRYS ER 20 MEQ PO TBCR
40.0000 meq | EXTENDED_RELEASE_TABLET | Freq: Once | ORAL | Status: AC
  Administered 2013-06-23: 40 meq via ORAL

## 2013-06-23 NOTE — Progress Notes (Addendum)
[facility] PROGRESS NOTE  [name]:[id number] DOB: 09-25-1925 DOA: 06/16/2013 PCP: Walker Kehr, MD   Brief narrative 80 y/o female with A fib on coumadin admitted to orthopedics service for fall with tight open tibial fracture. hospitalist consulted for management for Afib and chr diastolic dysfunction.   Assessment/Plan: A.Fib  - Currently Rate controlled, on Metorpolol 50 bid and  Cardizem 120 mg CD. Adjust dose as necessary.  On as  needed IV Lopressor has been ordered .  -coumadin resumed  Hypothyroidism.  -Home dose Synthroid . TSH wnl  Dyslipidemia.  -On Lipitor 10 mg   Chronic diastolic dysfunction  - EF 60% on echogram in 2012. Compensated.  -resumed lasix  Hypokalemia-  -replenish with daily kcl. Dose increased to 40 meq daily. Will add another 40 meq today  Mechanical fall with left Open tibial fracture  S/p Removal of external fixator, debridement of open fracture,  intramedullary nailing of left tib-fib fracture on 6/2 . tolerated sx well. Pain control per primary team. Has a wound vac placed which will likely be removed prior to discharge  constipation  on colace bid. Had good BM after enema given. continue current regimen   Disposition:  SNF per PT  DVT Prophylaxis:  Prophylactic Lovenox and coumadin   Code Status: full Family Communication: husband at bedside Disposition Plan:  SNF when cleared by ortho  Patient remains stable. No further recommendations. Will sign off. Please call us for any further assistance.  HPI/Subjective: Pain over left leg stable,. Had large BM today. Feels better  Objective: Filed Vitals:   06/23/13 0448  BP: 110/57  Pulse: 125  Temp: 97.8 F (36.6 C)  Resp: 16    Intake/Output Summary (Last 24 hours) at 06/23/13 1114 Last data filed at 06/23/13 0954  Gross per 24 hour  Intake    600 ml  Output    504 ml  Net     96 ml   Filed Weights   06/17/13 0223  Weight: 84.823 kg (187 lb)     Exam:   General:  Elderly female in NAD  HEENT: no pallor, moist mucosa  Chest: clear b/l, no added sounds   CVS: N S1&S2, no MRG  Abd: soft, NT, ND, BS+  Ext: warm, no edema, dressing over left leg. Wound vac in place  CNS: AAOX3  Data Reviewed: Basic Metabolic Panel:  Recent Labs Lab 06/16/13 2033 06/17/13 0803 06/20/13 9562 06/21/13 0847 06/21/13 2221 06/22/13 0429 06/23/13 0601  NA 139 137 137 136*  --  139 136*  K 3.6* 3.9 3.2* 3.0*  --  3.3* 3.6*  CL 101 97 88* 89*  --  92* 91*  CO2 23 26 37* 36*  --  32 32  GLUCOSE 88 137* 106* 111*  --  135* 148*  BUN 13 8 14 16   --  16 12  CREATININE 1.10 0.73 0.92 0.88 0.87 0.85 0.68  CALCIUM 8.8 8.4 8.7 9.3  --  9.1 8.9  MG 1.8  --   --  1.5  --   --   --    Liver Function Tests:  Recent Labs Lab 06/16/13 2033 06/22/13 0429  AST 26 27  ALT 23 9  ALKPHOS 90 67  BILITOT 0.8 1.5*  PROT 7.4 7.4  ALBUMIN 3.7 3.2*   No results found for this basename: LIPASE, AMYLASE,  in the last 168 hours No results found for this basename: AMMONIA,  in the last 168 hours CBC:  Recent  Labs Lab 06/16/13 2033 06/17/13 0803 06/20/13 0646 06/21/13 2221 06/22/13 0429  WBC 6.5 9.5 7.3 11.4* 8.1  NEUTROABS 4.0  --   --   --   --   HGB 14.4 13.5 13.7 13.8 13.6  HCT 42.6 39.5 38.5 39.8 40.0  MCV 107.8* 107.6* 110.0* 109.6* 107.8*  PLT 203 187 224 254 261   Cardiac Enzymes:  Recent Labs Lab 06/16/13 2033  CKTOTAL 101   BNP (last 3 results) No results found for this basename: PROBNP,  in the last 8760 hours CBG: No results found for this basename: GLUCAP,  in the last 168 hours  Recent Results (from the past 240 hour(s))  SURGICAL PCR SCREEN     Status: None   Collection Time    06/21/13  8:14 AM      Result Value Ref Range Status   MRSA, PCR NEGATIVE  NEGATIVE Final   Staphylococcus aureus NEGATIVE  NEGATIVE Final   Comment:            The Xpert SA Assay (FDA     approved for NASAL specimens     in  patients over 4 years of age),     is one component of     a comprehensive surveillance     program.  Test performance has     been validated by [employer] for patients greater     than or equal to 72 year old.     It is not intended     to diagnose infection nor to     guide or monitor treatment.     Studies: Dg Tibia/fibula Left  06/21/2013   CLINICAL DATA:  Tibial fracture  EXAM: LEFT TIBIA AND FIBULA - 2 VIEW  COMPARISON:  06/20/2013  FINDINGS: An intra medullary rod has been placed through the tibia with improved positioning and alignment at the site of distal tibial fracture. Proximal fibular fracture not visualized. Distal fibular fracture again demonstrates displacement laterally of the distal fracture fragment.  IMPRESSION: ORIF tibia fracture   Electronically Signed   By: Skipper Cliche M.D.   On: 06/21/2013 17:21   Dg Tibia/fibula Left Port  06/21/2013   CLINICAL DATA:  Fixation of tibia fractures.  EXAM: PORTABLE LEFT TIBIA AND FIBULA - 2 VIEW  COMPARISON:  06/17/2013.  FINDINGS: There is an intra medullary rod in the tibia with 2 proximal and 2 distal interlocking screws. The rod is traversing the distal tibia fractures. Comminuted proximal and distal fibular fractures are again noted.  IMPRESSION: Well-positioned intramedullary rod in the tibia.   Electronically Signed   By: Kalman Jewels M.D.   On: 06/21/2013 20:39   Dg C-arm 1-60 Min  06/21/2013   CLINICAL DATA:  Left leg surgery  EXAM: DG C-ARM 61-120 MIN  : COMPARISON:  06/20/2013  FINDINGS: ORIF tibial fracture with improved positioning and alignment at the fracture site.  IMPRESSION: ORIF tibial fracture   Electronically Signed   By: Skipper Cliche M.D.   On: 06/21/2013 17:22    Scheduled Meds: . antiseptic oral rinse  15 mL Mouth Rinse BID  . atorvastatin  10 mg Oral QPM  . B-complex with vitamin C  1 tablet Oral Daily  .  ceFAZolin (ANCEF) IV  2 g Intravenous 3 times per day  . cholecalciferol  1,000 Units  Oral Daily  . diltiazem  120 mg Oral Daily  . docusate sodium  100 mg Oral BID  . enoxaparin (  LOVENOX) injection  30 mg Subcutaneous Q24H  . ferrous sulfate  325 mg Oral Q breakfast  . furosemide  40 mg Oral BID  . levothyroxine  25 mcg Oral QAC breakfast  . metoprolol succinate  50 mg Oral BID  . pantoprazole  40 mg Oral QAC breakfast  . polyethylene glycol  17 g Oral Daily  . potassium chloride SA  40 mEq Oral Daily  . temazepam  15 mg Oral QHS  . vitamin C  500 mg Oral Daily  . warfarin  5 mg Oral ONCE-1800  . Warfarin - Pharmacist Dosing Inpatient   Does not apply q1800   Continuous Infusions:      Time spent: 25 minutes    Zennie Ayars  [facility] Pager [phone]. If 7PM-7AM, please contact night-coverage at [email or web address], password [facility] 06/23/2013, 11:14 AM  LOS: 7 days

## 2013-06-23 NOTE — Care Management Note (Signed)
CARE MANAGEMENT NOTE 06/23/2013  Patient:  [name], [name]   Account Number:  [id number]  Date Initiated:  06/17/2013  Documentation initiated by:  Tomi Bamberger  Subjective/Objective Assessment:   dx fall ankle fx  admit- lives with spouse,     Action/Plan:   pt/ot eval- rec snf  6/2- plan for IM nail procedure   Anticipated DC Date:  06/22/2013   Anticipated DC Plan:  [place]  In-house referral  Clinical Social Worker      DC Planning Services  CM consult      Choice offered to / List presented to:             Status of service:  In process, will continue to follow Medicare Important Message given?  YES (If response is "NO", the following Medicare IM given date fields will be blank) Date Medicare IM given:  06/20/2013 Date Additional Medicare IM given:  06/23/2013  Discharge Disposition:  [place]  Per UR Regulation:  Reviewed for med. necessity/level of care/duration of stay

## 2013-06-23 NOTE — Progress Notes (Signed)
Orthopedic Tech Progress Note Patient Details:  [name] 01-15-1926 [id number] On cpm at 7:45 pm LLE 0-30 Patient ID: [name], female   DOB: 1925/12/18, 80 y.o.   MRN: [id number]   Braulio Bosch 06/23/2013, 7:51 PM

## 2013-06-23 NOTE — Progress Notes (Signed)
ANTICOAGULATION CONSULT NOTE - Follow Up Consult  Pharmacy Consult  :  Coumadin with Lovenox bridging  Indication  :  atrial fibrillation   Dosing Weight: 85 kg   Recent Labs  06/21/13 0619 06/21/13 0847 06/21/13 2221 06/22/13 0429 06/23/13 0601  HGB  --   --  13.8 13.6  --   HCT  --   --  39.8 40.0  --   PLT  --   --  254 261  --   LABPROT 15.3*  --   --  14.0 18.4*  INR 1.24  --   --  1.10 1.58*  CREATININE  --  0.88 0.87 0.85 0.68    Medication PTA: Prescriptions prior to admission  Medication Sig Dispense Refill  . acetaminophen (TYLENOL) 500 MG tablet Take 500 mg by mouth daily as needed for pain.      [name] atorvastatin (LIPITOR) 10 MG tablet Take 10 mg by mouth every evening.      . B Complex-C (B-COMPLEX WITH VITAMIN C) tablet Take 1 tablet by mouth daily.      . Cholecalciferol (VITAMIN D3) 1000 UNITS tablet Take 1,000 Units by mouth daily.        . clobetasol cream (TEMOVATE) 1.82 % Apply 1 application topically 2 (two) times daily as needed (itching). As directed      . diltiazem (CARDIZEM CD) 120 MG 24 hr capsule Take 1 capsule (120 mg total) by mouth daily.  90 capsule  1  . Ferrous Sulfate (IRON) 325 (65 FE) MG TABS Take 1 tablet by mouth daily.      . furosemide (LASIX) 40 MG tablet Take 40 mg by mouth 2 (two) times daily.      [name] levothyroxine (SYNTHROID, LEVOTHROID) 25 MCG tablet Take 25 mcg by mouth daily before breakfast.      . metoprolol succinate (TOPROL-XL) 50 MG 24 hr tablet Take 1 tablet (50 mg total) by mouth 2 (two) times daily. Take with or immediately following a meal.  180 tablet  1  . pantoprazole (PROTONIX) 40 MG tablet Take 1 tablet (40 mg total) by mouth daily before breakfast.  90 tablet  3  . potassium chloride SA (K-DUR,KLOR-CON) 20 MEQ tablet Take 1 tablet (20 mEq total) by mouth daily.  90 tablet  1  . temazepam (RESTORIL) 30 MG capsule TAKE ONE CAPSULE AT BEDTIME AS NEEDED  30 capsule  5  . vitamin C (ASCORBIC ACID) 500 MG tablet Take 500 mg  by mouth daily. Take with iron dose      . warfarin (COUMADIN) 5 MG tablet Take 2.5-5 mg by mouth See admin instructions. Take 2.5 mg by mouth each night, except on Monday and Friday take 5 mg by mouth at night.         Medications: Scheduled:  . antiseptic oral rinse  15 mL Mouth Rinse BID  . atorvastatin  10 mg Oral QPM  . B-complex with vitamin C  1 tablet Oral Daily  .  ceFAZolin (ANCEF) IV  2 g Intravenous 3 times per day  . cholecalciferol  1,000 Units Oral Daily  . diltiazem  120 mg Oral Daily  . docusate sodium  100 mg Oral BID  . enoxaparin (LOVENOX) injection  30 mg Subcutaneous Q24H  . ferrous sulfate  325 mg Oral Q breakfast  . furosemide  40 mg Oral BID  . levothyroxine  25 mcg Oral QAC breakfast  . metoprolol succinate  50 mg Oral BID  . pantoprazole  40 mg Oral QAC breakfast  . polyethylene glycol  17 g Oral Daily  . potassium chloride SA  40 mEq Oral Daily  . temazepam  15 mg Oral QHS  . vitamin C  500 mg Oral Daily  . Warfarin - Pharmacist Dosing Inpatient   Does not apply q1800   Antibiotic[s]: Anti-infectives   Start     Dose/Rate Route Frequency Ordered Stop   06/21/13 2300  ceFAZolin (ANCEF) IVPB 2 g/50 mL premix     2 g 100 mL/hr over 30 Minutes Intravenous 3 times per day 06/21/13 2046 06/24/13 2159   06/17/13 0400  ceFAZolin (ANCEF) IVPB 2 g/50 mL premix     2 g 100 mL/hr over 30 Minutes Intravenous Every 6 hours 06/17/13 0225 06/19/13 2309   06/16/13 2315  gentamicin (GARAMYCIN) injection 120 mg     120 mg Intramuscular  Once 06/16/13 2304 06/17/13 0001   06/16/13 2315  vancomycin (VANCOCIN) powder 500 mg     500 mg Other To Surgery 06/16/13 2304 06/17/13 0002   06/16/13 2100  ceFAZolin (ANCEF) IVPB 1 g/50 mL premix     1 g 100 mL/hr over 30 Minutes Intravenous  Once 06/16/13 2048 06/16/13 2206   06/16/13 2030  ceFAZolin (ANCEF) IVPB 1 g/50 mL premix     1 g 100 mL/hr over 30 Minutes Intravenous  Once 06/16/13 2030 06/16/13 2136       Assessment:  80 y/o Female on chronic Coumadin for history of atrial fibrillation who has been restarted on Coumadin following surgery for a fall and open fracture.  She is also on Lovenox bridging until INR within range.  INR trending upward following 2 doses of Coumadin.  Goal:  INR 2-3   Plan: 1. Repeat Coumadin 5 mg po today. 2. Continue Lovenox until INR > 1.8. 3. Daily INR's, Platelet count, CBC.  Monitor for bleeding complications.    Layne Lebon, Craig Guess,  Pharm.D  06/23/2013, 10:47 AM

## 2013-06-23 NOTE — Progress Notes (Signed)
Subjective: Pt stable - leg not hurting much - wound vac on   Objective: Vital signs in last 24 hours: Temp:  [97.8 F (36.6 C)-98.2 F (36.8 C)] 97.8 F (36.6 C) (06/04 0448) Pulse Rate:  [58-125] 125 (06/04 0448) Resp:  [16] 16 (06/04 0448) BP: (105-115)/(57-69) 110/57 mmHg (06/04 0448) SpO2:  [96 %-100 %] 99 % (06/04 0448)  Intake/Output from previous day: 06/03 0701 - 06/04 0700 In: 360 [P.O.:360] Out: 501 [Urine:400; Drains:100; Stool:1] Intake/Output this shift:    Exam:  Sensation intact distally Dorsiflexion/Plantar flexion intact  Labs:  Recent Labs  06/21/13 2221 06/22/13 0429  HGB 13.8 13.6    Recent Labs  06/21/13 2221 06/22/13 0429  WBC 11.4* 8.1  RBC 3.63* 3.71*  HCT 39.8 40.0  PLT 254 261    Recent Labs  06/22/13 0429 06/23/13 0601  NA 139 136*  K 3.3* 3.6*  CL 92* 91*  CO2 32 32  BUN 16 12  CREATININE 0.85 0.68  GLUCOSE 135* 148*  CALCIUM 9.1 8.9    Recent Labs  06/22/13 0429 06/23/13 0601  INR 1.10 1.58*    Assessment/Plan: From ortho perspective she is ready for snf - nwb - inr near therapeutic   Meredith Pel 06/23/2013, 7:58 AM

## 2013-06-24 ENCOUNTER — Encounter (HOSPITAL_COMMUNITY): Payer: Self-pay | Admitting: Orthopedic Surgery

## 2013-06-24 LAB — PROTIME-INR
INR: 1.79 — ABNORMAL HIGH (ref 0.00–1.49)
Prothrombin Time: 20.3 seconds — ABNORMAL HIGH (ref 11.6–15.2)

## 2013-06-24 MED ORDER — WARFARIN SODIUM 5 MG PO TABS
5.0000 mg | ORAL_TABLET | Freq: Once | ORAL | Status: AC
  Administered 2013-06-24: 5 mg via ORAL
  Filled 2013-06-24: qty 1

## 2013-06-24 MED ORDER — HYDROCODONE-ACETAMINOPHEN 10-325 MG PO TABS: 1.0000 | ORAL_TABLET | ORAL | Status: DC | PRN

## 2013-06-24 NOTE — Progress Notes (Signed)
Physical Therapy Treatment Patient Details Name: [name] MRN: [id number] DOB: 03/19/1925 Today's Date: 06/24/2013    History of Present Illness 80 y.o. female admitted with an open distal tib-fib fracture, s/p external fixation removed and IM performed on 06/21/13. NWB on LLE.    PT Comments    Pt very apprehensive about OOB activities/transfers.  Pt demonstrates greater strength during seated and supine exercises in bil. LE than she demonstrates during transfers.  Pt required mod-A +2 for sit to stand with one person to block for NWB through L LE and for physical assistance to power up, and second person to block R LE to maintain appropriate positioning for WB in standing.  VC provided for hand placement and use of RW.  Standing pivot transfer required max-A +2 to maintain hip extension, NWB through L LE, guide RW, and for max VC for strategy and encouragement.  Pt tolerated sitting and supine exercises well.  Will continue to follow.   Follow Up Recommendations  SNF     Equipment Recommendations  Rolling walker with 5" wheels;Wheelchair (measurements PT)    Recommendations for Other Services       Precautions / Restrictions Precautions Precautions: Fall Precaution Comments: Discussed safety precautions. Restrictions Weight Bearing Restrictions: Yes LLE Weight Bearing: Non weight bearing    Mobility  Bed Mobility Overal bed mobility: Needs Assistance Bed Mobility: Supine to Sit     Supine to sit: Mod assist     General bed mobility comments: Mod-A to cue for hand placement, lift L LE slightly to ease over-bed movement, and some physical assist to power up into sitting.  Transfers Overall transfer level: Needs assistance Equipment used: Rolling walker (2 wheeled) Transfers: Sit to/from [facility] Sit to Stand: +2 physical assistance;Mod assist;From elevated surface Stand pivot transfers: Max assist;+2 safety/equipment       General transfer  comment: Mod-A to power up to standing from sitting EOB with +2 to have one person ensure NWB in L LE and one person to block R foot into proper positioning to accept weight.  VC for hand placement and strategy.  Pivot transfer required max-A +2 for safety for max VC for strategy and encouragement, physical assistance to maintain hip extension, guidance of RW, and blocking to maintain NWB through L LE.  Ambulation/Gait                 Stairs            Wheelchair Mobility    Modified Rankin (Stroke Patients Only)       Balance Overall balance assessment: Needs assistance Sitting-balance support: Bilateral upper extremity supported;Feet supported (single foot supported) Sitting balance-Leahy Scale: Poor Sitting balance - Comments: Pt maintained posterior lean in sitting at EOB requiring bil. UE support and sometimes min-A to maintain upright posture.   Standing balance support: Bilateral upper extremity supported Standing balance-Leahy Scale: Zero Standing balance comment: Pt able to bear body weight through R LE and bil. UE on RW, but requires max-A to keep hips forward to maintain standing balance.                    Cognition Arousal/Alertness: Awake/alert Behavior During Therapy: WFL for tasks assessed/performed;Anxious Overall Cognitive Status: Within Functional Limits for tasks assessed                      Exercises General Exercises - Lower Extremity Ankle Circles/Pumps: AROM;Right;10 reps;Seated Long Arc Quad: AROM;Both;10 reps;Seated Hip  ABduction/ADduction: AAROM;Left;5 reps;Supine Straight Leg Raises: AROM;Both;5 reps;Supine;AAROM (AAROM to guide L LE (minimal assistance needed))    General Comments General comments (skin integrity, edema, etc.): Pt very anxious about any OOB activity and is afraid of falling.  Pt demonstrates greater strength in bil. LE during seated and supine exercises than she demonstrates during transfer activities.   Pt anxious about having other people help her with transfers.      Pertinent Vitals/Pain Pt did not complain of pain, no pain meds requested at this time.    Home Living                      Prior Function            PT Goals (current goals can now be found in the care plan section) Acute Rehab PT Goals Patient Stated Goal: Finish getting moved in to her new home with her husband PT Goal Formulation: With patient/family Time For Goal Achievement: 07/01/13 Potential to Achieve Goals: Good Progress towards PT goals: Progressing toward goals    Frequency  Min 3X/week    PT Plan Current plan remains appropriate    Co-evaluation             End of Session Equipment Utilized During Treatment: Gait belt;Oxygen Activity Tolerance: Other (comment) (Pt limited by anxiety/fear of falling.) Patient left: in chair;with call bell/phone within reach     Time: 0851-0923 PT Time Calculation (min): 32 min  Charges:                       G Codes:      Pradeep Beaubrun, SPT 06/24/2013, 10:16 AM

## 2013-06-24 NOTE — Progress Notes (Signed)
Subjective: Pt stable   Objective: Vital signs in last 24 hours: Temp:  [98.1 F (36.7 C)-98.2 F (36.8 C)] 98.2 F (36.8 C) (06/05 0627) Pulse Rate:  [105-107] 105 (06/05 0627) Resp:  [16] 16 (06/05 0627) BP: (114-116)/(56-61) 115/58 mmHg (06/05 0627) SpO2:  [95 %-100 %] 95 % (06/05 0627)  Intake/Output from previous day: 06/04 0701 - 06/05 0700 In: 590 [P.O.:240; IV Piggyback:350] Out: 7 [Urine:2; Stool:5] Intake/Output this shift:    Exam:  Sensation intact distally Intact pulses distally  Labs:  Recent Labs  06/21/13 2221 06/22/13 0429  HGB 13.8 13.6    Recent Labs  06/21/13 2221 06/22/13 0429  WBC 11.4* 8.1  RBC 3.63* 3.71*  HCT 39.8 40.0  PLT 254 261    Recent Labs  06/22/13 0429 06/23/13 0601  NA 139 136*  K 3.3* 3.6*  CL 92* 91*  CO2 32 32  BUN 16 12  CREATININE 0.85 0.68  GLUCOSE 135* 148*  CALCIUM 9.1 8.9    Recent Labs  06/22/13 0429 06/23/13 0601  INR 1.10 1.58*    Assessment/Plan: Ready for dc awaiting psu input inr not yet therapeatic   Meredith Pel 06/24/2013, 8:09 AM

## 2013-06-24 NOTE — Discharge Summary (Signed)
Physician Discharge Summary  Patient ID: [name] MRN: [id number] DOB/AGE: 78-Dec-1927 78 y.o.  Admit date: 06/16/2013 Discharge date: 06/25/2013 Admission Diagnoses:  Active Problems:   HYPOTHYROIDISM   HYPERTENSION   Atrial flutter   Long term current use of anticoagulant   Open tibial fracture   Tibial fracture   Unspecified constipation   Discharge Diagnoses:  Same  Surgeries: Procedure(s): INTRAMEDULLARY (IM) NAIL TIBIAL REMOVAL EXTERNAL FIXATION LEG APPLICATION OF A-CELL OF EXTREMITY APPLICATION OF WOUND VAC on 06/16/2013 - 06/21/2013   Consultants:    Discharged Condition: Stable  Hospital Course: [name] is an 80 y.o. female who was admitted 06/16/2013 with a chief complaint of  Chief Complaint  Patient presents with  . Fall  . Foot Injury  , and found to have a diagnosis of open tibia fracture.  They were brought to the operating room on 06/16/2013 - 06/21/2013 and underwent the above named procedures.She was managed with PSU consult and wound vac with non weight bearing.Will follow up 10 days with me for wound inspection    Antibiotics given:  Anti-infectives   Start     Dose/Rate Route Frequency Ordered Stop   06/21/13 2300  ceFAZolin (ANCEF) IVPB 2 g/50 mL premix     2 g 100 mL/hr over 30 Minutes Intravenous 3 times per day 06/21/13 2046 06/24/13 2159   06/17/13 0400  ceFAZolin (ANCEF) IVPB 2 g/50 mL premix     2 g 100 mL/hr over 30 Minutes Intravenous Every 6 hours 06/17/13 0225 06/19/13 2309   06/16/13 2315  gentamicin (GARAMYCIN) injection 120 mg     120 mg Intramuscular  Once 06/16/13 2304 06/17/13 0001   06/16/13 2315  vancomycin (VANCOCIN) powder 500 mg     500 mg Other To Surgery 06/16/13 2304 06/17/13 0002   06/16/13 2100  ceFAZolin (ANCEF) IVPB 1 g/50 mL premix     1 g 100 mL/hr over 30 Minutes Intravenous  Once 06/16/13 2048 06/16/13 2206   06/16/13 2030  ceFAZolin (ANCEF) IVPB 1 g/50 mL premix     1 g 100 mL/hr over 30 Minutes  Intravenous  Once 06/16/13 2030 06/16/13 2136    .  Recent vital signs:  Filed Vitals:   06/24/13 0627  BP: 115/58  Pulse: 105  Temp: 98.2 F (36.8 C)  Resp: 16    Recent laboratory studies:  Results for orders placed during the hospital encounter of 06/16/13  SURGICAL PCR SCREEN      Result Value Ref Range   MRSA, PCR NEGATIVE  NEGATIVE   Staphylococcus aureus NEGATIVE  NEGATIVE  CBC WITH DIFFERENTIAL      Result Value Ref Range   WBC 6.5  4.0 - 10.5 K/uL   RBC 3.95  3.87 - 5.11 MIL/uL   Hemoglobin 14.4  12.0 - 15.0 g/dL   HCT 42.6  36.0 - 46.0 %   MCV 107.8 (*) 78.0 - 100.0 fL   MCH 36.5 (*) 26.0 - 34.0 pg   MCHC 33.8  30.0 - 36.0 g/dL   RDW 12.7  11.5 - 15.5 %   Platelets 203  150 - 400 K/uL   Neutrophils Relative % 60  43 - 77 %   Neutro Abs 4.0  1.7 - 7.7 K/uL   Lymphocytes Relative 24  12 - 46 %   Lymphs Abs 1.5  0.7 - 4.0 K/uL   Monocytes Relative 13 (*) 3 - 12 %   Monocytes Absolute 0.9  0.1 - 1.0 K/uL  Eosinophils Relative 2  0 - 5 %   Eosinophils Absolute 0.1  0.0 - 0.7 K/uL   Basophils Relative 1  0 - 1 %   Basophils Absolute 0.0  0.0 - 0.1 K/uL  PROTIME-INR      Result Value Ref Range   Prothrombin Time 26.9 (*) 11.6 - 15.2 seconds   INR 2.59 (*) 0.00 - 1.49  COMPREHENSIVE METABOLIC PANEL      Result Value Ref Range   Sodium 139  137 - 147 mEq/L   Potassium 3.6 (*) 3.7 - 5.3 mEq/L   Chloride 101  96 - 112 mEq/L   CO2 23  19 - 32 mEq/L   Glucose, Bld 88  70 - 99 mg/dL   BUN 13  6 - 23 mg/dL   Creatinine, Ser 1.10  0.50 - 1.10 mg/dL   Calcium 8.8  8.4 - 10.5 mg/dL   Total Protein 7.4  6.0 - 8.3 g/dL   Albumin 3.7  3.5 - 5.2 g/dL   AST 26  0 - 37 U/L   ALT 23  0 - 35 U/L   Alkaline Phosphatase 90  39 - 117 U/L   Total Bilirubin 0.8  0.3 - 1.2 mg/dL   GFR calc non Af Amer 44 (*) >90 mL/min   GFR calc Af Amer 52 (*) >90 mL/min  CK      Result Value Ref Range   Total CK 101  7 - 177 U/L  MAGNESIUM      Result Value Ref Range   Magnesium 1.8   1.5 - 2.5 mg/dL  CBC      Result Value Ref Range   WBC 9.5  4.0 - 10.5 K/uL   RBC 3.67 (*) 3.87 - 5.11 MIL/uL   Hemoglobin 13.5  12.0 - 15.0 g/dL   HCT 39.5  36.0 - 46.0 %   MCV 107.6 (*) 78.0 - 100.0 fL   MCH 36.8 (*) 26.0 - 34.0 pg   MCHC 34.2  30.0 - 36.0 g/dL   RDW 12.7  11.5 - 15.5 %   Platelets 187  150 - 400 K/uL  BASIC METABOLIC PANEL      Result Value Ref Range   Sodium 137  137 - 147 mEq/L   Potassium 3.9  3.7 - 5.3 mEq/L   Chloride 97  96 - 112 mEq/L   CO2 26  19 - 32 mEq/L   Glucose, Bld 137 (*) 70 - 99 mg/dL   BUN 8  6 - 23 mg/dL   Creatinine, Ser 0.73  0.50 - 1.10 mg/dL   Calcium 8.4  8.4 - 10.5 mg/dL   GFR calc non Af Amer 76 (*) >90 mL/min   GFR calc Af Amer 88 (*) >90 mL/min  PROTIME-INR      Result Value Ref Range   Prothrombin Time 25.3 (*) 11.6 - 15.2 seconds   INR 2.39 (*) 0.00 - 1.49  PROTIME-INR      Result Value Ref Range   Prothrombin Time 20.9 (*) 11.6 - 15.2 seconds   INR 1.86 (*) 0.00 - 1.49  PROTIME-INR      Result Value Ref Range   Prothrombin Time 17.7 (*) 11.6 - 15.2 seconds   INR 1.50 (*) 0.00 - 1.49  PROTIME-INR      Result Value Ref Range   Prothrombin Time 15.9 (*) 11.6 - 15.2 seconds   INR 1.30  0.00 - 1.49  CBC      Result Value Ref  Range   WBC 7.3  4.0 - 10.5 K/uL   RBC 3.50 (*) 3.87 - 5.11 MIL/uL   Hemoglobin 13.7  12.0 - 15.0 g/dL   HCT 38.5  36.0 - 46.0 %   MCV 110.0 (*) 78.0 - 100.0 fL   MCH 39.1 (*) 26.0 - 34.0 pg   MCHC 35.6  30.0 - 36.0 g/dL   RDW 12.5  11.5 - 15.5 %   Platelets 224  150 - 400 K/uL  BASIC METABOLIC PANEL      Result Value Ref Range   Sodium 137  137 - 147 mEq/L   Potassium 3.2 (*) 3.7 - 5.3 mEq/L   Chloride 88 (*) 96 - 112 mEq/L   CO2 37 (*) 19 - 32 mEq/L   Glucose, Bld 106 (*) 70 - 99 mg/dL   BUN 14  6 - 23 mg/dL   Creatinine, Ser 0.92  0.50 - 1.10 mg/dL   Calcium 8.7  8.4 - 10.5 mg/dL   GFR calc non Af Amer 55 (*) >90 mL/min   GFR calc Af Amer 64 (*) >90 mL/min  PROTIME-INR      Result  Value Ref Range   Prothrombin Time 15.3 (*) 11.6 - 15.2 seconds   INR 1.24  0.00 - 7.00  BASIC METABOLIC PANEL      Result Value Ref Range   Sodium 136 (*) 137 - 147 mEq/L   Potassium 3.0 (*) 3.7 - 5.3 mEq/L   Chloride 89 (*) 96 - 112 mEq/L   CO2 36 (*) 19 - 32 mEq/L   Glucose, Bld 111 (*) 70 - 99 mg/dL   BUN 16  6 - 23 mg/dL   Creatinine, Ser 0.88  0.50 - 1.10 mg/dL   Calcium 9.3  8.4 - 10.5 mg/dL   GFR calc non Af Amer 58 (*) >90 mL/min   GFR calc Af Amer 68 (*) >90 mL/min  MAGNESIUM      Result Value Ref Range   Magnesium 1.5  1.5 - 2.5 mg/dL  CBC      Result Value Ref Range   WBC 8.1  4.0 - 10.5 K/uL   RBC 3.71 (*) 3.87 - 5.11 MIL/uL   Hemoglobin 13.6  12.0 - 15.0 g/dL   HCT 40.0  36.0 - 46.0 %   MCV 107.8 (*) 78.0 - 100.0 fL   MCH 36.7 (*) 26.0 - 34.0 pg   MCHC 34.0  30.0 - 36.0 g/dL   RDW 12.5  11.5 - 15.5 %   Platelets 261  150 - 400 K/uL  COMPREHENSIVE METABOLIC PANEL      Result Value Ref Range   Sodium 139  137 - 147 mEq/L   Potassium 3.3 (*) 3.7 - 5.3 mEq/L   Chloride 92 (*) 96 - 112 mEq/L   CO2 32  19 - 32 mEq/L   Glucose, Bld 135 (*) 70 - 99 mg/dL   BUN 16  6 - 23 mg/dL   Creatinine, Ser 0.85  0.50 - 1.10 mg/dL   Calcium 9.1  8.4 - 10.5 mg/dL   Total Protein 7.4  6.0 - 8.3 g/dL   Albumin 3.2 (*) 3.5 - 5.2 g/dL   AST 27  0 - 37 U/L   ALT 9  0 - 35 U/L   Alkaline Phosphatase 67  39 - 117 U/L   Total Bilirubin 1.5 (*) 0.3 - 1.2 mg/dL   GFR calc non Af Amer 61 (*) >90 mL/min   GFR calc Af  Amer 70 (*) >90 mL/min  PROTIME-INR      Result Value Ref Range   Prothrombin Time 14.0  11.6 - 15.2 seconds   INR 1.10  0.00 - 1.49  CBC      Result Value Ref Range   WBC 11.4 (*) 4.0 - 10.5 K/uL   RBC 3.63 (*) 3.87 - 5.11 MIL/uL   Hemoglobin 13.8  12.0 - 15.0 g/dL   HCT 39.8  36.0 - 46.0 %   MCV 109.6 (*) 78.0 - 100.0 fL   MCH 38.0 (*) 26.0 - 34.0 pg   MCHC 34.7  30.0 - 36.0 g/dL   RDW 12.4  11.5 - 15.5 %   Platelets 254  150 - 400 K/uL  CREATININE, SERUM       Result Value Ref Range   Creatinine, Ser 0.87  0.50 - 1.10 mg/dL   GFR calc non Af Amer 59 (*) >90 mL/min   GFR calc Af Amer 68 (*) >90 mL/min  TSH      Result Value Ref Range   TSH 0.650  0.350 - 4.500 uIU/mL  PROTIME-INR      Result Value Ref Range   Prothrombin Time 18.4 (*) 11.6 - 15.2 seconds   INR 1.58 (*) 0.00 - 2.35  BASIC METABOLIC PANEL      Result Value Ref Range   Sodium 136 (*) 137 - 147 mEq/L   Potassium 3.6 (*) 3.7 - 5.3 mEq/L   Chloride 91 (*) 96 - 112 mEq/L   CO2 32  19 - 32 mEq/L   Glucose, Bld 148 (*) 70 - 99 mg/dL   BUN 12  6 - 23 mg/dL   Creatinine, Ser 0.68  0.50 - 1.10 mg/dL   Calcium 8.9  8.4 - 10.5 mg/dL   GFR calc non Af Amer 78 (*) >90 mL/min   GFR calc Af Amer 90 (*) >90 mL/min    Discharge Medications:     Medication List         acetaminophen 500 MG tablet  Commonly known as:  TYLENOL  Take 500 mg by mouth daily as needed for pain.     atorvastatin 10 MG tablet  Commonly known as:  LIPITOR  Take 10 mg by mouth every evening.     B-complex with vitamin C tablet  Take 1 tablet by mouth daily.     cholecalciferol 1000 UNITS tablet  Commonly known as:  VITAMIN D  Take 1,000 Units by mouth daily.     clobetasol cream 0.05 %  Commonly known as:  TEMOVATE  Apply 1 application topically 2 (two) times daily as needed (itching). As directed     diltiazem 120 MG 24 hr capsule  Commonly known as:  CARDIZEM CD  Take 1 capsule (120 mg total) by mouth daily.     furosemide 40 MG tablet  Commonly known as:  LASIX  Take 40 mg by mouth 2 (two) times daily.     HYDROcodone-acetaminophen 10-325 MG per tablet  Commonly known as:  NORCO  Take 1 tablet by mouth every 4 (four) hours as needed for moderate pain.     Iron 325 (65 FE) MG Tabs  Take 1 tablet by mouth daily.     levothyroxine 25 MCG tablet  Commonly known as:  SYNTHROID, LEVOTHROID  Take 25 mcg by mouth daily before breakfast.     metoprolol succinate 50 MG 24 hr tablet  Commonly  known as:  TOPROL-XL  Take 1 tablet (50  mg total) by mouth 2 (two) times daily. Take with or immediately following a meal.     pantoprazole 40 MG tablet  Commonly known as:  PROTONIX  Take 1 tablet (40 mg total) by mouth daily before breakfast.     potassium chloride SA 20 MEQ tablet  Commonly known as:  K-DUR,KLOR-CON  Take 1 tablet (20 mEq total) by mouth daily.     temazepam 30 MG capsule  Commonly known as:  RESTORIL  TAKE ONE CAPSULE AT BEDTIME AS NEEDED     vitamin C 500 MG tablet  Commonly known as:  ASCORBIC ACID  Take 500 mg by mouth daily. Take with iron dose     warfarin 5 MG tablet  Commonly known as:  COUMADIN  Take 2.5-5 mg by mouth See admin instructions. Take 2.5 mg by mouth each night, except on Monday and Friday take 5 mg by mouth at night.        Diagnostic Studies: Dg Tibia/fibula Left  06/21/2013   CLINICAL DATA:  Tibial fracture  EXAM: LEFT TIBIA AND FIBULA - 2 VIEW  COMPARISON:  06/20/2013  FINDINGS: An intra medullary rod has been placed through the tibia with improved positioning and alignment at the site of distal tibial fracture. Proximal fibular fracture not visualized. Distal fibular fracture again demonstrates displacement laterally of the distal fracture fragment.  IMPRESSION: ORIF tibia fracture   Electronically Signed   By: Skipper Cliche M.D.   On: 06/21/2013 17:21   Dg Tibia/fibula Left  06/17/2013   CLINICAL DATA:  External fixation of left ankle fracture.  EXAM: DG C-ARM 61-120 MIN; LEFT TIBIA AND FIBULA - 2 VIEW  COMPARISON:  Left tibia/ fibula radiographs performed 06/16/2013  FINDINGS: Three fluoroscopic C-arm images are provided from the OR. These demonstrate external fixation screws transfixing the distal tibial diaphysis just above the level of the fracture. Apparent packing material is noted at the fracture site. The comminuted tibial and fibular fractures are again seen. These demonstrate mildly improved alignment in comparison to the prior  study.  IMPRESSION: Status post external fixation of distal tibial fracture. Comminuted tibial and fibular fractures demonstrate mildly improved alignment.   Electronically Signed   By: Garald Balding M.D.   On: 06/17/2013 03:00   Ct Ankle Left Wo Contrast  06/20/2013   CLINICAL DATA:  Preop.  Evaluate distal fracture fragments.  EXAM: CT OF THE LEFT ANKLE WITHOUT CONTRAST  TECHNIQUE: Multidetector CT imaging was performed according to the standard protocol. Multiplanar CT image reconstructions were also generated.  COMPARISON:  Radiography from 2 days prior  FINDINGS: Recent open fracture of the distal leg. Antibiotic beads are present within the tibial fracture and within the anterior soft tissue incision/defect. The tibial fracture is through the distal diaphysis. The largest comminuted fragment is posterior and medial, measuring 4.5 cm in length. The main fracture plane, which is transverse, is displaced 25% laterally. No significant rotation. There is a separate fractures through the lateral aspect of the distal tibial epiphysis, extending to the joint, without offset.  Fibular fracture is segmental, with 20 cm between fractures. Comminution at the level of the proximal diaphysis and distal diaphysis fractures.  No hindfoot or midfoot fracture identified.  External fixation has been applied into the calcaneus and tibia. There is no fracture around the pins.  No evidence of fluid collection to suggest abscess. No gross tendon rupture or displacement. Arterial calcification. Osteopenia.  IMPRESSION: 1. Comminuted distal tibial diaphysis fracture with displacement described above. 2.  Segmental fibular diaphysis fracture with comminution. 3. Tibial avulsion fracture at the anterior tibiofibular ligament attachment. No widening of the ankle mortise.   Electronically Signed   By: Jorje Guild M.D.   On: 06/20/2013 22:44   Dg Chest Portable 1 View  06/16/2013   CLINICAL DATA:  Leg pain post fall  EXAM:  PORTABLE CHEST - 1 VIEW  COMPARISON:  Portable exam at 2102 hrs compared to 06/15/2012  FINDINGS: Enlargement of cardiac silhouette.  Mediastinal contours and pulmonary vascularity normal.  Numerous EKG leads project over chest particularly RIGHT middle lobe.  Chronic subsegmental atelectasis versus scarring in RIGHT middle lobe, superimposed with EKG leads, better visualized on previous exam.  No definite acute infiltrate, pleural effusion or pneumothorax.  Bones demineralized.  IMPRESSION: Enlargement of cardiac silhouette.  Chronic subsegmental atelectasis versus scarring in RIGHT middle lobe.   Electronically Signed   By: Lavonia Dana M.D.   On: 06/16/2013 21:35   Dg Tibia/fibula Left Port  06/21/2013   CLINICAL DATA:  Fixation of tibia fractures.  EXAM: PORTABLE LEFT TIBIA AND FIBULA - 2 VIEW  COMPARISON:  06/17/2013.  FINDINGS: There is an intra medullary rod in the tibia with 2 proximal and 2 distal interlocking screws. The rod is traversing the distal tibia fractures. Comminuted proximal and distal fibular fractures are again noted.  IMPRESSION: Well-positioned intramedullary rod in the tibia.   Electronically Signed   By: Kalman Jewels M.D.   On: 06/21/2013 20:39   Dg Tibia/fibula Left Port  06/16/2013   CLINICAL DATA:  Status post fall; left lower leg pain and open deformity.  EXAM: PORTABLE LEFT TIBIA AND FIBULA - 2 VIEW  COMPARISON:  None.  FINDINGS: There are comminuted fractures involving the distal tibial and fibular diaphyses, with one shaft width anterior and lateral displacement of the distal tibia, shortening at the fracture site, and more than one shaft width lateral displacement of the distal fibula. Scattered soft tissue air is seen, reflecting the open nature of the fracture, with marked associated soft tissue disruption.  The ankle mortise appears grossly intact. The distal interosseous space is grossly preserved.  There is also a mildly comminuted fracture involving the proximal  fibular diaphysis, with associated soft tissue swelling. Given the degree of soft tissue injury, would follow up to ensure that no compartment syndrome or rhabdomyolysis develops.  No significant knee joint effusion is identified. There is mild cortical irregularity along the articular surface of the patella. Scattered vascular calcifications are seen.  IMPRESSION: 1. Comminuted fractures involving the distal tibial and fibular diaphyses, with one shaft width anterior and lateral displacement of the distal tibia and shortening at the fracture site, and more than one shaft width lateral displacement of the distal fibula. This is an open fracture, with marked soft tissue disruption and scattered soft tissue air. 2. Mildly comminuted fracture involving the proximal fibular diaphysis, with associated soft tissue swelling. 3. Given the degree of soft tissue injury, would follow up to ensure that no compartment syndrome or rhabdomyolysis develops.   Electronically Signed   By: Garald Balding M.D.   On: 06/16/2013 21:35   Dg Ankle Left Port  06/17/2013   CLINICAL DATA:  LEFT ankle fracture post external fixation  EXAM: PORTABLE LEFT ANKLE - 2 VIEW  COMPARISON:  Intraoperative images 06/17/2013  FINDINGS: External fixation device has been applied.  Distal RIGHT tibial fracture identified displaced medially and slightly anteriorly.  Displaced fibular diaphyseal fracture identified displaced medially and slightly anteriorly.  Ankle  joint alignment appears normal.  Bones appear slightly demineralized.  Wound VAC and antibiotic beads noted.  IMPRESSION: Postoperative changes from external fixation as above.   Electronically Signed   By: Lavonia Dana M.D.   On: 06/17/2013 03:05   Dg Foot 2 Views Left  06/16/2013   CLINICAL DATA:  Status post fall; left lower leg pain and open deformity.  EXAM: LEFT FOOT - 2 VIEW  COMPARISON:  None.  FINDINGS: The comminuted and displaced fractures through the distal diaphyses of the tibia  and fibula are better characterized on concurrent tibia/fibula radiographs.  No additional fractures are seen. Visualized joint spaces are grossly preserved. The subtalar joint is unremarkable in appearance. Tiny plantar and posterior calcaneal spurs are noted.  Marked soft tissue disruption is noted about the distal lower leg, with prominent soft tissue air.  IMPRESSION: 1. Comminuted displaced fractures through the distal diaphyses of the tibia and fibula are better characterized on concurrent tibia/fibula radiographs. 2. No additional fractures seen. 3. Marked soft tissue disruption about the distal lower leg, with prominent soft tissue air.   Electronically Signed   By: Garald Balding M.D.   On: 06/16/2013 21:37   Dg C-arm 1-60 Min  06/21/2013   CLINICAL DATA:  Left leg surgery  EXAM: DG C-ARM 61-120 MIN  : COMPARISON:  06/20/2013  FINDINGS: ORIF tibial fracture with improved positioning and alignment at the fracture site.  IMPRESSION: ORIF tibial fracture   Electronically Signed   By: Skipper Cliche M.D.   On: 06/21/2013 17:22   Dg C-arm 1-60 Min  06/17/2013   CLINICAL DATA:  External fixation of left ankle fracture.  EXAM: DG C-ARM 61-120 MIN; LEFT TIBIA AND FIBULA - 2 VIEW  COMPARISON:  Left tibia/ fibula radiographs performed 06/16/2013  FINDINGS: Three fluoroscopic C-arm images are provided from the OR. These demonstrate external fixation screws transfixing the distal tibial diaphysis just above the level of the fracture. Apparent packing material is noted at the fracture site. The comminuted tibial and fibular fractures are again seen. These demonstrate mildly improved alignment in comparison to the prior study.  IMPRESSION: Status post external fixation of distal tibial fracture. Comminuted tibial and fibular fractures demonstrate mildly improved alignment.   Electronically Signed   By: Garald Balding M.D.   On: 06/17/2013 03:00    Disposition: 01-Home or Self Care      Discharge Instructions    Call MD / Call 911    Complete by:  As directed   If you experience chest pain or shortness of breath, CALL 911 and be transported to the hospital emergency room.  If you develope a fever above 101 F, pus (white drainage) or increased drainage or redness at the wound, or calf pain, call your surgeon's office.     Constipation Prevention    Complete by:  As directed   Drink plenty of fluids.  Prune juice may be helpful.  You may use a stool softener, such as Colace (over the counter) 100 mg twice a day.  Use MiraLax (over the counter) for constipation as needed.     Diet - low sodium heart healthy    Complete by:  As directed      Discharge instructions    Complete by:  As directed   CPM 1 hour per 8 0 - 50 degrees Wound vac in place Non weight bearing left leg     Increase activity slowly as tolerated    Complete by:  As directed  Signed: Meredith Pel 06/24/2013, 8:20 AM

## 2013-06-24 NOTE — Progress Notes (Signed)
Orthopedic Tech Progress Note Patient Details:  KYNSLEY WHITEHOUSE [birth year] [phone] Off cpm at 11:00 pm Ortho Devices Type of Ortho Device: Ace wrap;Post (short leg) splint Ortho Device/Splint Location: LLE Ortho Device/Splint Interventions: Ordered;Application   Braulio Bosch 06/24/2013, 11:18 PM

## 2013-06-24 NOTE — Progress Notes (Signed)
Agree with SPT.    Mayank Teuscher, PT [phone]  

## 2013-06-24 NOTE — Progress Notes (Signed)
Clinicals sent to [facility] for possible d/c summary   Rhea Pink, MSW, [place]

## 2013-06-24 NOTE — Progress Notes (Signed)
ANTICOAGULATION CONSULT NOTE - Follow Up Consult  Pharmacy Consult  :  Coumadin with Lovenox bridging  Indication  :  atrial fibrillation   Dosing Weight: 85 kg   Recent Labs  06/21/13 2221 06/22/13 0429 06/23/13 0601 06/24/13 0745  HGB 13.8 13.6  --   --   HCT 39.8 40.0  --   --   PLT 254 261  --   --   LABPROT  --  14.0 18.4* 20.3*  INR  --  1.10 1.58* 1.79*  CREATININE 0.87 0.85 0.68  --      Medications: Scheduled:  . antiseptic oral rinse  15 mL Mouth Rinse BID  . atorvastatin  10 mg Oral QPM  . B-complex with vitamin C  1 tablet Oral Daily  .  ceFAZolin (ANCEF) IV  2 g Intravenous 3 times per day  . cholecalciferol  1,000 Units Oral Daily  . diltiazem  120 mg Oral Daily  . docusate sodium  100 mg Oral BID  . enoxaparin (LOVENOX) injection  30 mg Subcutaneous Q24H  . ferrous sulfate  325 mg Oral Q breakfast  . furosemide  40 mg Oral BID  . levothyroxine  25 mcg Oral QAC breakfast  . metoprolol succinate  50 mg Oral BID  . pantoprazole  40 mg Oral QAC breakfast  . polyethylene glycol  17 g Oral Daily  . potassium chloride SA  40 mEq Oral Daily  . temazepam  15 mg Oral QHS  . vitamin C  500 mg Oral Daily  . Warfarin - Pharmacist Dosing Inpatient   Does not apply q1800    Infusions:     Assessment:  80 y/o female on chronic Coumadin for history of atrial fibrillation who has been restarted on Coumadin post-op ortho surgery for an open fx.  She is also continuing on Lovenox bridging.  INR continuing to trend up toward therapeutic window, INR 1.79.  No evidence of bleeding complications noted   Goal:  INR 2-3   Plan: 1. Repeat Coumadin 5 mg po today. 2. Continue Lovenox through today, then will dc tomorrow if INR > 1.8 3. Daily INR's, Platelet count, CBC.  Monitor for bleeding complications.    Sharlett Lienemann, Craig Guess,  Pharm.D  06/24/2013, 12:01 PM

## 2013-06-25 DIAGNOSIS — S82409B Unspecified fracture of shaft of unspecified fibula, initial encounter for open fracture type I or II: Secondary | ICD-10-CM | POA: Diagnosis not present

## 2013-06-25 DIAGNOSIS — K651 Peritoneal abscess: Secondary | ICD-10-CM | POA: Diagnosis not present

## 2013-06-25 DIAGNOSIS — I5032 Chronic diastolic (congestive) heart failure: Secondary | ICD-10-CM | POA: Diagnosis present

## 2013-06-25 DIAGNOSIS — R197 Diarrhea, unspecified: Secondary | ICD-10-CM | POA: Diagnosis not present

## 2013-06-25 DIAGNOSIS — I509 Heart failure, unspecified: Secondary | ICD-10-CM | POA: Diagnosis present

## 2013-06-25 DIAGNOSIS — S81009A Unspecified open wound, unspecified knee, initial encounter: Secondary | ICD-10-CM | POA: Diagnosis not present

## 2013-06-25 DIAGNOSIS — I739 Peripheral vascular disease, unspecified: Secondary | ICD-10-CM | POA: Diagnosis not present

## 2013-06-25 DIAGNOSIS — K2970 Gastritis, unspecified, without bleeding: Secondary | ICD-10-CM | POA: Diagnosis not present

## 2013-06-25 DIAGNOSIS — M159 Polyosteoarthritis, unspecified: Secondary | ICD-10-CM | POA: Diagnosis not present

## 2013-06-25 DIAGNOSIS — S81809A Unspecified open wound, unspecified lower leg, initial encounter: Secondary | ICD-10-CM | POA: Diagnosis not present

## 2013-06-25 DIAGNOSIS — K630 Abscess of intestine: Secondary | ICD-10-CM | POA: Diagnosis present

## 2013-06-25 DIAGNOSIS — Z01818 Encounter for other preprocedural examination: Secondary | ICD-10-CM | POA: Diagnosis not present

## 2013-06-25 DIAGNOSIS — Z5189 Encounter for other specified aftercare: Secondary | ICD-10-CM | POA: Diagnosis not present

## 2013-06-25 DIAGNOSIS — D638 Anemia in other chronic diseases classified elsewhere: Secondary | ICD-10-CM | POA: Diagnosis present

## 2013-06-25 DIAGNOSIS — R1084 Generalized abdominal pain: Secondary | ICD-10-CM | POA: Diagnosis not present

## 2013-06-25 DIAGNOSIS — S82209A Unspecified fracture of shaft of unspecified tibia, initial encounter for closed fracture: Secondary | ICD-10-CM | POA: Diagnosis not present

## 2013-06-25 DIAGNOSIS — R10819 Abdominal tenderness, unspecified site: Secondary | ICD-10-CM | POA: Diagnosis not present

## 2013-06-25 DIAGNOSIS — R1031 Right lower quadrant pain: Secondary | ICD-10-CM | POA: Diagnosis not present

## 2013-06-25 DIAGNOSIS — L97409 Non-pressure chronic ulcer of unspecified heel and midfoot with unspecified severity: Secondary | ICD-10-CM | POA: Diagnosis not present

## 2013-06-25 DIAGNOSIS — I4892 Unspecified atrial flutter: Secondary | ICD-10-CM | POA: Diagnosis present

## 2013-06-25 DIAGNOSIS — E039 Hypothyroidism, unspecified: Secondary | ICD-10-CM | POA: Diagnosis present

## 2013-06-25 DIAGNOSIS — I10 Essential (primary) hypertension: Secondary | ICD-10-CM | POA: Diagnosis present

## 2013-06-25 DIAGNOSIS — D509 Iron deficiency anemia, unspecified: Secondary | ICD-10-CM | POA: Diagnosis present

## 2013-06-25 DIAGNOSIS — IMO0002 Reserved for concepts with insufficient information to code with codable children: Secondary | ICD-10-CM | POA: Diagnosis not present

## 2013-06-25 DIAGNOSIS — Z6835 Body mass index (BMI) 35.0-35.9, adult: Secondary | ICD-10-CM | POA: Diagnosis not present

## 2013-06-25 DIAGNOSIS — F411 Generalized anxiety disorder: Secondary | ICD-10-CM | POA: Diagnosis not present

## 2013-06-25 DIAGNOSIS — S91009A Unspecified open wound, unspecified ankle, initial encounter: Secondary | ICD-10-CM | POA: Diagnosis present

## 2013-06-25 DIAGNOSIS — K2990 Gastroduodenitis, unspecified, without bleeding: Secondary | ICD-10-CM | POA: Diagnosis not present

## 2013-06-25 DIAGNOSIS — M899 Disorder of bone, unspecified: Secondary | ICD-10-CM | POA: Diagnosis present

## 2013-06-25 DIAGNOSIS — M79609 Pain in unspecified limb: Secondary | ICD-10-CM | POA: Diagnosis not present

## 2013-06-25 DIAGNOSIS — Z96649 Presence of unspecified artificial hip joint: Secondary | ICD-10-CM | POA: Diagnosis not present

## 2013-06-25 DIAGNOSIS — E872 Acidosis, unspecified: Secondary | ICD-10-CM | POA: Diagnosis not present

## 2013-06-25 DIAGNOSIS — M6281 Muscle weakness (generalized): Secondary | ICD-10-CM | POA: Diagnosis not present

## 2013-06-25 DIAGNOSIS — Z66 Do not resuscitate: Secondary | ICD-10-CM | POA: Diagnosis present

## 2013-06-25 DIAGNOSIS — S82209B Unspecified fracture of shaft of unspecified tibia, initial encounter for open fracture type I or II: Secondary | ICD-10-CM | POA: Diagnosis not present

## 2013-06-25 DIAGNOSIS — K5900 Constipation, unspecified: Secondary | ICD-10-CM | POA: Diagnosis present

## 2013-06-25 DIAGNOSIS — A419 Sepsis, unspecified organism: Secondary | ICD-10-CM | POA: Diagnosis present

## 2013-06-25 DIAGNOSIS — F3289 Other specified depressive episodes: Secondary | ICD-10-CM | POA: Diagnosis not present

## 2013-06-25 DIAGNOSIS — Z7901 Long term (current) use of anticoagulants: Secondary | ICD-10-CM | POA: Diagnosis not present

## 2013-06-25 DIAGNOSIS — T8189XA Other complications of procedures, not elsewhere classified, initial encounter: Secondary | ICD-10-CM | POA: Diagnosis not present

## 2013-06-25 DIAGNOSIS — K5732 Diverticulitis of large intestine without perforation or abscess without bleeding: Secondary | ICD-10-CM | POA: Diagnosis not present

## 2013-06-25 DIAGNOSIS — I4510 Unspecified right bundle-branch block: Secondary | ICD-10-CM | POA: Diagnosis not present

## 2013-06-25 DIAGNOSIS — L270 Generalized skin eruption due to drugs and medicaments taken internally: Secondary | ICD-10-CM | POA: Diagnosis not present

## 2013-06-25 DIAGNOSIS — N731 Chronic parametritis and pelvic cellulitis: Secondary | ICD-10-CM | POA: Diagnosis present

## 2013-06-25 DIAGNOSIS — N390 Urinary tract infection, site not specified: Secondary | ICD-10-CM | POA: Diagnosis not present

## 2013-06-25 DIAGNOSIS — Z5181 Encounter for therapeutic drug level monitoring: Secondary | ICD-10-CM | POA: Diagnosis not present

## 2013-06-25 DIAGNOSIS — E669 Obesity, unspecified: Secondary | ICD-10-CM | POA: Diagnosis present

## 2013-06-25 DIAGNOSIS — M949 Disorder of cartilage, unspecified: Secondary | ICD-10-CM | POA: Diagnosis present

## 2013-06-25 DIAGNOSIS — D7589 Other specified diseases of blood and blood-forming organs: Secondary | ICD-10-CM | POA: Diagnosis not present

## 2013-06-25 DIAGNOSIS — R112 Nausea with vomiting, unspecified: Secondary | ICD-10-CM | POA: Diagnosis not present

## 2013-06-25 DIAGNOSIS — K449 Diaphragmatic hernia without obstruction or gangrene: Secondary | ICD-10-CM | POA: Diagnosis present

## 2013-06-25 DIAGNOSIS — R21 Rash and other nonspecific skin eruption: Secondary | ICD-10-CM | POA: Diagnosis not present

## 2013-06-25 DIAGNOSIS — E785 Hyperlipidemia, unspecified: Secondary | ICD-10-CM | POA: Diagnosis present

## 2013-06-25 DIAGNOSIS — E876 Hypokalemia: Secondary | ICD-10-CM | POA: Diagnosis not present

## 2013-06-25 DIAGNOSIS — S8290XS Unspecified fracture of unspecified lower leg, sequela: Secondary | ICD-10-CM | POA: Diagnosis not present

## 2013-06-25 DIAGNOSIS — Z9181 History of falling: Secondary | ICD-10-CM | POA: Diagnosis not present

## 2013-06-25 DIAGNOSIS — L97809 Non-pressure chronic ulcer of other part of unspecified lower leg with unspecified severity: Secondary | ICD-10-CM | POA: Diagnosis not present

## 2013-06-25 DIAGNOSIS — Z981 Arthrodesis status: Secondary | ICD-10-CM | POA: Diagnosis not present

## 2013-06-25 DIAGNOSIS — R079 Chest pain, unspecified: Secondary | ICD-10-CM | POA: Diagnosis not present

## 2013-06-25 DIAGNOSIS — G479 Sleep disorder, unspecified: Secondary | ICD-10-CM | POA: Diagnosis not present

## 2013-06-25 DIAGNOSIS — Z79899 Other long term (current) drug therapy: Secondary | ICD-10-CM | POA: Diagnosis not present

## 2013-06-25 DIAGNOSIS — R109 Unspecified abdominal pain: Secondary | ICD-10-CM | POA: Diagnosis not present

## 2013-06-25 DIAGNOSIS — S82899B Other fracture of unspecified lower leg, initial encounter for open fracture type I or II: Secondary | ICD-10-CM | POA: Diagnosis not present

## 2013-06-25 DIAGNOSIS — R279 Unspecified lack of coordination: Secondary | ICD-10-CM | POA: Diagnosis not present

## 2013-06-25 DIAGNOSIS — G4700 Insomnia, unspecified: Secondary | ICD-10-CM | POA: Diagnosis not present

## 2013-06-25 DIAGNOSIS — K560 Paralytic ileus: Secondary | ICD-10-CM | POA: Diagnosis not present

## 2013-06-25 DIAGNOSIS — K219 Gastro-esophageal reflux disease without esophagitis: Secondary | ICD-10-CM | POA: Diagnosis present

## 2013-06-25 DIAGNOSIS — M1990 Unspecified osteoarthritis, unspecified site: Secondary | ICD-10-CM | POA: Diagnosis present

## 2013-06-25 DIAGNOSIS — F329 Major depressive disorder, single episode, unspecified: Secondary | ICD-10-CM | POA: Diagnosis not present

## 2013-06-25 DIAGNOSIS — I4891 Unspecified atrial fibrillation: Secondary | ICD-10-CM | POA: Diagnosis present

## 2013-06-25 DIAGNOSIS — E890 Postprocedural hypothyroidism: Secondary | ICD-10-CM | POA: Diagnosis not present

## 2013-06-25 DIAGNOSIS — K5289 Other specified noninfective gastroenteritis and colitis: Secondary | ICD-10-CM | POA: Diagnosis not present

## 2013-06-25 DIAGNOSIS — B999 Unspecified infectious disease: Secondary | ICD-10-CM | POA: Diagnosis not present

## 2013-06-25 DIAGNOSIS — E079 Disorder of thyroid, unspecified: Secondary | ICD-10-CM | POA: Diagnosis not present

## 2013-06-25 DIAGNOSIS — R262 Difficulty in walking, not elsewhere classified: Secondary | ICD-10-CM | POA: Diagnosis not present

## 2013-06-25 DIAGNOSIS — Q762 Congenital spondylolisthesis: Secondary | ICD-10-CM | POA: Diagnosis not present

## 2013-06-25 LAB — PROTIME-INR
INR: 2.1 — ABNORMAL HIGH (ref 0.00–1.49)
Prothrombin Time: 22.9 seconds — ABNORMAL HIGH (ref 11.6–15.2)

## 2013-06-25 MED ORDER — WARFARIN SODIUM 2.5 MG PO TABS
2.5000 mg | ORAL_TABLET | ORAL | Status: DC
  Filled 2013-06-25: qty 1

## 2013-06-25 MED ORDER — WARFARIN SODIUM 5 MG PO TABS: 5.0000 mg | ORAL_TABLET | ORAL | Status: DC

## 2013-06-25 NOTE — Progress Notes (Signed)
4 Days Post-Op  Subjective: The patient is resting in the room.  Not in any distress.  Daughter is present.  Both very pleasant.    Objective: Vital signs in last 24 hours: Temp:  [97.7 F (36.5 C)-98.7 F (37.1 C)] 98.7 F (37.1 C) (06/05 1958) Pulse Rate:  [85-105] 85 (06/05 1958) Resp:  [16-20] 18 (06/05 1958) BP: (115-120)/(58-81) 120/81 mmHg (06/05 1958) SpO2:  [95 %-100 %] 98 % (06/05 1958) Last BM Date: 06/24/13  Intake/Output from previous day: 06/05 0701 - 06/06 0700 In: 720 [P.O.:720] Out: -  Intake/Output this shift:    General appearance: alert, cooperative and no distress Skin: Skin color, texture, turgor normal. No rashes or lesions or VAC in place at 75 mmHg pressure.  Minimal drainage.  Lab Results:   Recent Labs  06/22/13 0429  WBC 8.1  HGB 13.6  HCT 40.0  PLT 261   BMET  Recent Labs  06/22/13 0429 06/23/13 0601  NA 139 136*  K 3.3* 3.6*  CL 92* 91*  CO2 32 32  GLUCOSE 135* 148*  BUN 16 12  CREATININE 0.85 0.68  CALCIUM 9.1 8.9   PT/INR  Recent Labs  06/23/13 0601 06/24/13 0745  LABPROT 18.4* 20.3*  INR 1.58* 1.79*   ABG No results found for this basename: PHART, PCO2, PO2, HCO3,  in the last 72 hours  Studies/Results: No results found.  Anti-infectives: Anti-infectives   Start     Dose/Rate Route Frequency Ordered Stop   06/21/13 2300  ceFAZolin (ANCEF) IVPB 2 g/50 mL premix     2 g 100 mL/hr over 30 Minutes Intravenous 3 times per day 06/21/13 2046 06/24/13 1629   06/17/13 0400  ceFAZolin (ANCEF) IVPB 2 g/50 mL premix     2 g 100 mL/hr over 30 Minutes Intravenous Every 6 hours 06/17/13 0225 06/19/13 2309   06/16/13 2315  gentamicin (GARAMYCIN) injection 120 mg     120 mg Intramuscular  Once 06/16/13 2304 06/17/13 0001   06/16/13 2315  vancomycin (VANCOCIN) powder 500 mg     500 mg Other To Surgery 06/16/13 2304 06/17/13 0002   06/16/13 2100  ceFAZolin (ANCEF) IVPB 1 g/50 mL premix     1 g 100 mL/hr over 30 Minutes  Intravenous  Once 06/16/13 2048 06/16/13 2206   06/16/13 2030  ceFAZolin (ANCEF) IVPB 1 g/50 mL premix     1 g 100 mL/hr over 30 Minutes Intravenous  Once 06/16/13 2030 06/16/13 2136      Assessment/Plan: s/p Procedure(s): INTRAMEDULLARY (IM) NAIL TIBIAL (Left) REMOVAL EXTERNAL FIXATION LEG (Left) APPLICATION OF A-CELL OF EXTREMITY (Left) APPLICATION OF WOUND VAC (Left) Discharge with follow up in the [facility] next week.  If there is any trouble getting an appointment than she can be seen at [facility]. [phone] Will plan to do the VAC change once a week.   LOS: 9 days    [employer] 06/25/2013

## 2013-06-25 NOTE — Progress Notes (Signed)
Patient is medically stable for D/C to [facility] today. Per Mardene Celeste weekend supervisor at [facility] patient is going to room 601-B. RN will call report to [facility]. Clinical Education officer, museum (CSW) prepared D/C packet, faxed D/C summary to [employer], and arranged EMS for transportation. Patient's daughter [name] is at bedside and is aware of above. Nursing and MD aware of above. Please reconsult if further social work needs arise. CSW signing off.   Blima Rich, LCSWA Weekend CSW [phone]

## 2013-06-25 NOTE — Progress Notes (Signed)
Called [facility] and gave report to [employer] at this time.

## 2013-06-25 NOTE — Progress Notes (Addendum)
ANTICOAGULATION CONSULT NOTE - Follow Up Consult  Pharmacy Consult  :  Coumadin  Indication  :  atrial fibrillation   Heparin Dosing Weight: 85 kg   Recent Labs  06/23/13 0601 06/24/13 0745 06/25/13 0400  LABPROT 18.4* 20.3* 22.9*  INR 1.58* 1.79* 2.10*  CREATININE 0.68  --   --      Medications: Scheduled:  . antiseptic oral rinse  15 mL Mouth Rinse BID  . atorvastatin  10 mg Oral QPM  . B-complex with vitamin C  1 tablet Oral Daily  . cholecalciferol  1,000 Units Oral Daily  . diltiazem  120 mg Oral Daily  . docusate sodium  100 mg Oral BID  . enoxaparin (LOVENOX) injection  30 mg Subcutaneous Q24H  . ferrous sulfate  325 mg Oral Q breakfast  . furosemide  40 mg Oral BID  . levothyroxine  25 mcg Oral QAC breakfast  . metoprolol succinate  50 mg Oral BID  . pantoprazole  40 mg Oral QAC breakfast  . polyethylene glycol  17 g Oral Daily  . potassium chloride SA  40 mEq Oral Daily  . temazepam  15 mg Oral QHS  . vitamin C  500 mg Oral Daily  . Warfarin - Pharmacist Dosing Inpatient   Does not apply q1800    Infusions:     Assessment:  80 y/o F on Coumadin at home for history of atrial fibrillation.  Coumadin restarted w/Lovenox bridge following Open fracture repair surgery.  INR 2.1 today.  No evidence of bleeding complications noted   Goal:  INR 2-3   Plan: 1. Anticipate Transfer to SNF today. 2. DC Lovenox. 3. Restart Home Coumadin schedule 2.5 mg daily except 5 mg on M,F. 4. Daily INR's, Platelet count, CBC.  Monitor for bleeding complications.    Kailee Essman, Craig Guess,  Pharm.D  06/25/2013, 10:34 AM

## 2013-06-25 NOTE — Progress Notes (Addendum)
Subjective: 4 Days Post-Op Procedure(s) (LRB): INTRAMEDULLARY (IM) NAIL TIBIAL (Left) REMOVAL EXTERNAL FIXATION LEG (Left) APPLICATION OF A-CELL OF EXTREMITY (Left) APPLICATION OF WOUND VAC (Left) Patient reports pain as mild.    Objective: Vital signs in last 24 hours: Temp:  [97.7 F (36.5 C)-98.7 F (37.1 C)] 97.8 F (36.6 C) (06/06 0501) Pulse Rate:  [80-88] 80 (06/06 0501) Resp:  [18-20] 18 (06/06 0501) BP: (103-120)/(52-81) 103/52 mmHg (06/06 0501) SpO2:  [96 %-100 %] 98 % (06/06 0501)  Intake/Output from previous day: 06/05 0701 - 06/06 0700 In: 960 [P.O.:960] Out: -  Intake/Output this shift:    No results found for this basename: HGB,  in the last 72 hours No results found for this basename: WBC, RBC, HCT, PLT,  in the last 72 hours  Recent Labs  06/23/13 0601  NA 136*  K 3.6*  CL 91*  CO2 32  BUN 12  CREATININE 0.68  GLUCOSE 148*  CALCIUM 8.9    Recent Labs  06/24/13 0745 06/25/13 0400  INR 1.79* 2.10*    Neurologically intact  VAC intact.   No leak.    Assessment/Plan: 4 Days Post-Op Procedure(s) (LRB): INTRAMEDULLARY (IM) NAIL TIBIAL (Left) REMOVAL EXTERNAL FIXATION LEG (Left) APPLICATION OF A-CELL OF EXTREMITY (Left) APPLICATION OF WOUND VAC (Left) Discharge to SNF   Has been on coumadin in past 5mg  twice a week and 2.5 mg the other days of the week.   [name] 06/25/2013, 8:43 AM  PATIENT HAS A-CELL ON WOUND.  DO NOT TAKE OFF VAC. WILL BE SEEN BY DR Migdalia Dk PLASTIC SURGERY  NEXT WEEK, DAUGHTER WILL CALL Monday TO ARRANGE .

## 2013-06-26 DIAGNOSIS — I10 Essential (primary) hypertension: Secondary | ICD-10-CM | POA: Diagnosis not present

## 2013-06-26 DIAGNOSIS — I4891 Unspecified atrial fibrillation: Secondary | ICD-10-CM | POA: Diagnosis not present

## 2013-06-26 DIAGNOSIS — E039 Hypothyroidism, unspecified: Secondary | ICD-10-CM | POA: Diagnosis not present

## 2013-06-26 DIAGNOSIS — S82209A Unspecified fracture of shaft of unspecified tibia, initial encounter for closed fracture: Secondary | ICD-10-CM | POA: Diagnosis not present

## 2013-06-28 DIAGNOSIS — S82202E Unspecified fracture of shaft of left tibia, subsequent encounter for open fracture type I or II with routine healing: Secondary | ICD-10-CM | POA: Insufficient documentation

## 2013-06-28 DIAGNOSIS — S82402E Unspecified fracture of shaft of left fibula, subsequent encounter for open fracture type I or II with routine healing: Secondary | ICD-10-CM | POA: Insufficient documentation

## 2013-07-04 ENCOUNTER — Encounter (HOSPITAL_BASED_OUTPATIENT_CLINIC_OR_DEPARTMENT_OTHER): Payer: Medicare Other | Attending: Plastic Surgery

## 2013-07-04 DIAGNOSIS — L97809 Non-pressure chronic ulcer of other part of unspecified lower leg with unspecified severity: Secondary | ICD-10-CM | POA: Diagnosis not present

## 2013-07-04 NOTE — Progress Notes (Signed)
[facility]  NAME:  [name], KINSER NO.:  [id number]  MEDICAL RECORD NO.:  [id number]      DATE OF BIRTH:  1925-11-26  PHYSICIAN:  Theodoro Kos, DO       VISIT DATE:  07/04/2013                                  OFFICE VISIT   The patient is an 80 year old female who had a traumatic fracture of her left tibia when she was in the kitchen and twisted and she sustained open fracture.  She underwent rodding in the OR and was left with the open area right over the fracture site.  At that time, we were able to do a slight tissue rearrangement and placed ACell.  She has the [facility] in place.  The area is looking very good.  The skin all appears to be viable and the ACell appears to be incorporating.  We will continue with the [facility] and see her back in 1 week.  In the meantime, she is to continue with focusing on healthy eating, and increasing her protein.     Theodoro Kos, DO     CS/MEDQ  D:  07/04/2013  T:  07/04/2013  Job:  [id number]

## 2013-07-07 DIAGNOSIS — S82209B Unspecified fracture of shaft of unspecified tibia, initial encounter for open fracture type I or II: Secondary | ICD-10-CM | POA: Diagnosis not present

## 2013-07-10 DIAGNOSIS — G479 Sleep disorder, unspecified: Secondary | ICD-10-CM | POA: Diagnosis not present

## 2013-07-10 DIAGNOSIS — F411 Generalized anxiety disorder: Secondary | ICD-10-CM | POA: Diagnosis not present

## 2013-07-11 ENCOUNTER — Telehealth: Payer: Self-pay | Admitting: Internal Medicine

## 2013-07-11 NOTE — Telephone Encounter (Signed)
Patient's daughter called to let Dr. Alain Marion know that she requested that her Mom's assisted living facility start her on Buspar 7.5mg  and she is taking 2x a day. No call back requested.

## 2013-07-12 NOTE — Progress Notes (Signed)
[facility]  NAME:  [name], [name] NO.:  [id number]  MEDICAL RECORD NO.:  [id number]      DATE OF BIRTH:  12-02-1925  PHYSICIAN:  Theodoro Kos, DO       VISIT DATE:  07/11/2013                                  OFFICE VISIT   The patient is an 80 year old female who is here for followup on her left lower extremity open fracture ulcer secondary to trauma.  She underwent repair of the fracture and placement of ACell and the VAC. She is in a rehab facility and starting into PT.  The site does appear to be doing well in healing, with a little bit of fluid there today which was massaged a little bit, but the ACell is in place.  We will continue with the VAC.  No sign of overt infection, and we will continue monitoring and changing the VAC weekly.     Theodoro Kos, DO     CS/MEDQ  D:  07/11/2013  T:  07/12/2013  Job:  [id number]

## 2013-07-13 NOTE — Telephone Encounter (Signed)
Noted. Thx.

## 2013-07-15 DIAGNOSIS — L97409 Non-pressure chronic ulcer of unspecified heel and midfoot with unspecified severity: Secondary | ICD-10-CM | POA: Diagnosis not present

## 2013-07-18 DIAGNOSIS — L97809 Non-pressure chronic ulcer of other part of unspecified lower leg with unspecified severity: Secondary | ICD-10-CM | POA: Diagnosis not present

## 2013-07-19 LAB — PROTIME-INR: INR: 3.1 — AB (ref 0.9–1.1)

## 2013-07-19 NOTE — Progress Notes (Signed)
[facility]  NAME:  [name], [name]                 ACCOUNT NO.:  [id number]  MEDICAL RECORD NO.:  [id number]      DATE OF BIRTH:  17-Mar-1925  PHYSICIAN:  Irene Limbo, MD    VISIT DATE:  07/18/2013                                  OFFICE VISIT   CHIEF COMPLAINT:  Followup of left lower extremity wound.  HISTORY OF PRESENT ILLNESS:  The patient is an 80 year old female that suffered a tibia fracture.  The patient underwent IM nail placement. The patient had  laceration over fracture that was partially closed primarily and for the remaining open area, Dr. Migdalia Dk has placed ACell. She has been in a skilled nursing facility with wound VAC in place that has been changed weekly.  She is accompanied by her husband and daughter today who reports that she had to remove the VAC a few days ago secondary to drainage and not holding suction of the wound VAC.  She remains non-weight bearing.  She does have a foam off-loading boot, but on exam today she has evidence of pressure injury to her heel.  PHYSICAL EXAMINATION:  Blood pressure is 104/65, pulse is 109, temperature is 97.6.  Left anterior lower extremity has open wound measured as a cluster at 10.5 x 10 x 0.2 cm.  There is both a mixture incorporating ACell as well as slough present.  Limited debridement with scissors were completed to remove the superficial slough.  Her left lateral foot wound has healed.  Over her left heel, she has discoloration measured as 1.4 x 1.5 cm consistent with pressure injury. There is no open wound present at this time.  No calf or leg circumferences were obtained today.  ASSESSMENT:  Open wound in the setting of internal fixation and underlying fracture.  The patient remains non-weight bearing.  We will plan for twice a week VAC change given the increased drainage over this past week.  There does not appear to be any cellulitis present. Instructed that she needs to continue to  off-load her lower extremity to prevent further injury to her heel.  Despite her offloading boot, which she is also wearing in bed, she has developed pressure injury.  We placed her in a new boot today and counseled the family to keep an eye on the area and if they note that the discoloration is increasing in size, they should remove the boot and the patient should be conscious to move her foot frequently.  The patient will follow up in 1 week's time.          ______________________________ Irene Limbo, MD     BT/MEDQ  D:  07/18/2013  T:  07/19/2013  Job:  [id number]

## 2013-07-24 ENCOUNTER — Other Ambulatory Visit: Payer: Self-pay | Admitting: Cardiovascular Disease

## 2013-07-25 ENCOUNTER — Encounter (HOSPITAL_BASED_OUTPATIENT_CLINIC_OR_DEPARTMENT_OTHER): Payer: Medicare Other | Attending: Plastic Surgery

## 2013-07-25 DIAGNOSIS — L97809 Non-pressure chronic ulcer of other part of unspecified lower leg with unspecified severity: Secondary | ICD-10-CM | POA: Diagnosis not present

## 2013-07-25 LAB — PROTIME-INR: INR: 2.1 — AB (ref 0.9–1.1)

## 2013-07-25 NOTE — Progress Notes (Signed)
[facility]  NAME:  [name], [name] NO.:  [id number]  MEDICAL RECORD NO.:  [id number]      DATE OF BIRTH:  [birth year]  PHYSICIAN:  Theodoro Kos, DO       VISIT DATE:  07/25/2013                                  OFFICE VISIT   HISTORY:  The patient is an 80 year old female who had a fall in her kitchen and ended up with a open fracture of the lower extremities.  She underwent repair of the fracture and has the open wound.  We were consulted.  She had debridement and ACell placement, and she has been treated with a VAC.  She is doing extremely well.  She is filling in very well.  This is very encouraging.  We will continue with the VAC, triple antibiotic ointment for the heal, and we will plan to see her back in a week for VAC change.     Theodoro Kos, DO     CS/MEDQ  D:  07/25/2013  T:  07/25/2013  Job:  [id number]

## 2013-07-26 ENCOUNTER — Encounter: Payer: Medicare Other | Admitting: Internal Medicine

## 2013-08-01 DIAGNOSIS — L97809 Non-pressure chronic ulcer of other part of unspecified lower leg with unspecified severity: Secondary | ICD-10-CM | POA: Diagnosis not present

## 2013-08-02 NOTE — Progress Notes (Signed)
[facility]  NAME:  [name], [name]                 ACCOUNT NO.:  [id number]  MEDICAL RECORD NO.:  [id number]      DATE OF BIRTH:  Jan 03, 1926  PHYSICIAN:  Irene Limbo, MD    VISIT DATE:  08/01/2013                                  OFFICE VISIT   CHIEF COMPLAINT:  Followup of left lower extremity wound.  HISTORY OF PRESENT ILLNESS:  The patient is 80 year old, ambulatory female that developed an open left tibia-fibula fracture in May of 2015. Patient underwent first stabilization with external fixator followed by internal nail placement by Dr. Marlou Sa.  Patient had significant open wound over the anterior surface of the lower extremity that was approximated and ACell was applied.  She has been currently under wound care with wound VAC.  She presents today for followup.  PHYSICAL EXAMINATION:  Blood pressure is 115/78, pulse is 102, temperature is 97.1.  Over her left heel, she appears to have contracted the stage I pressure injury, the area of discoloration is measured as 2.2 x 2.5 cm.  There is no open wound here noted.  Over the left anterior lower extremity, she has open wound that is measured at 12.3 x 7.5 x 0.4 cm in greatest depth.  There is some slough present, and there is an area of approximately 1.5 x 1.5 cm of exposed tibia that is devoid of periosteum.  There is no exposed hardware present.  There is no surrounding cellulitis of her wound.  The wound is measured as a cluster.  The more distal wound had slough present that was removed with curette today for selective debridement.  ASSESSMENT:  Now exposed bone with underlying fracture and internal hardware.  Discussed the case with Dr. Marlou Sa.  We recommend evaluation for flap closure given its location over the mid to distal third of the lower extremity and the size of the wound.  The patient will need to be evaluated whether she is a candidate for free flap surgery. As this surgery is not done in  [place], she requires referral to a tertiary center.  The patient's daughter lives in [place] and requested referral to [facility], referral request was made today for urgent evaluation.  In the interim, we will continue with wound VAC. Discussed with the patient and family today that the absence of healthy soft tissue could lead to underlying infection and nonunion of her bone. She is currently nonweightbearing over this extremity.  Will continue to follow up to ensure that she has appropriate care arranged with.          ______________________________ Irene Limbo, MD MBA     BT/MEDQ  D:  08/01/2013  T:  08/02/2013  Job:  [id number]

## 2013-08-03 DIAGNOSIS — Z0279 Encounter for issue of other medical certificate: Secondary | ICD-10-CM

## 2013-08-04 DIAGNOSIS — S82209B Unspecified fracture of shaft of unspecified tibia, initial encounter for open fracture type I or II: Secondary | ICD-10-CM | POA: Diagnosis not present

## 2013-08-05 DIAGNOSIS — Z5189 Encounter for other specified aftercare: Secondary | ICD-10-CM | POA: Diagnosis not present

## 2013-08-05 DIAGNOSIS — I4510 Unspecified right bundle-branch block: Secondary | ICD-10-CM | POA: Diagnosis not present

## 2013-08-05 DIAGNOSIS — I4891 Unspecified atrial fibrillation: Secondary | ICD-10-CM | POA: Diagnosis not present

## 2013-08-05 DIAGNOSIS — Z01818 Encounter for other preprocedural examination: Secondary | ICD-10-CM | POA: Diagnosis not present

## 2013-08-05 DIAGNOSIS — Z5181 Encounter for therapeutic drug level monitoring: Secondary | ICD-10-CM | POA: Diagnosis not present

## 2013-08-08 LAB — PROTIME-INR: INR: 2.9 — AB (ref 0.9–1.1)

## 2013-08-09 ENCOUNTER — Telehealth: Payer: Self-pay | Admitting: Cardiovascular Disease

## 2013-08-09 NOTE — Telephone Encounter (Signed)
Left message for Chris to call back.

## 2013-08-09 NOTE — Telephone Encounter (Signed)
Spoke with Gerald Stabs at [facility] who is currently with pt. Gerald Stabs reports pt is scheduled for skin graft at [facility] on August 23, 2013. Surgeon is requesting pt stop Coumadin 7 days prior to surgery and be bridged with Lovenox. Gerald Stabs reports they can give this at [facility]. She is requesting dosing instructions. Pt has been at [facility] since discharge from hospital in early June and they have been monitoring INR and dosing Coumadin. Fax and phone system has been down at [facility] so they are unable to send [place] readings. Gerald Stabs reports INR yesterday was 2.93. Coumadin dose is 5 mg on Sunday and 2.5 mg other days of week. Will send to Dr. Angelena Form for clearance to stop Coumadin and bridge with Lovenox.

## 2013-08-09 NOTE — Telephone Encounter (Signed)
New message     Pt is having surgery in [place] on her legs.  She will need a lovonox bridge.  However, she has questions about lovonox and can the staff at [facility] give it to her.  She request to talk to Dr Angelena Form only if possible.

## 2013-08-09 NOTE — Telephone Encounter (Signed)
Left message on call back number listed for pt and also on mobile number listed for pt

## 2013-08-09 NOTE — Telephone Encounter (Signed)
Follow up      Need to stop coumadin 7days prior to surgery---need lovonox dosage.  Pt is having surgery in [place].

## 2013-08-09 NOTE — Telephone Encounter (Signed)
Follow up     Left a new number for the nurse to call at her convenience

## 2013-08-10 ENCOUNTER — Encounter: Payer: Self-pay | Admitting: Cardiovascular Disease

## 2013-08-10 NOTE — Telephone Encounter (Signed)
I spoke with Gerald Stabs at [facility] and gave her information in letter from Dr. Angelena Form. Will also fax letter to Dr. [phone]. Letter has been routed to Dr. Elba Barman in [facility]. Gerald Stabs will contact Dr. Meredith Staggers office regarding indications for Lovenox bridging. Gerald Stabs will give information to pt.

## 2013-08-10 NOTE — Telephone Encounter (Signed)
See my letter dated today. cdm

## 2013-08-10 NOTE — Telephone Encounter (Signed)
Spoke with Gerald Stabs at [facility]. Pt is non weight bearing at this time. Coumadin for atrial fib. Surgeon is Dr. Hulan Fray in [place]--phone [phone]. Hospital number is [phone]

## 2013-08-12 NOTE — Telephone Encounter (Addendum)
The pt is advised that her nurse, Gerald Stabs, has been advised about Lovenox bridging and will discuss with her. The pt states that Gerald Stabs has not heard from [place]. I advised the pt that Dr Camillia Herter nurse, Fraser Din, noted in her chart that her nurse Gerald Stabs was advised on 7/22. She asked me to hold on the phone while she went to get Gerald Stabs so I could give her Dr Camillia Herter recommendations. I held on for over 15 mins.  [name] called back so I could talk with Gerald Stabs, her nurse. I read Dr Camillia Herter letter to her and she stated that they have not heard from Dr Meredith Staggers office yet and that the pt wants to know what to do. I advised Gerald Stabs to contact Dr Meredith Staggers office for further instructions as Dr Angelena Form has addressed his Lovenox recommendations in a letter and that letter has been faxed to Dr Elba Barman, she stated that she would try to contact them now and that she will advise the pt.

## 2013-08-12 NOTE — Telephone Encounter (Signed)
Follow up     Pt request to talk to Dr Angelena Form regarding her starting the lovenox.  Did Dr Angelena Form talk to Dr Elba Barman in [place]?  She said she cannot get any answers from the nurses and her surgery is scheduled for aug 4th.

## 2013-08-13 ENCOUNTER — Other Ambulatory Visit: Payer: Self-pay | Admitting: Cardiovascular Disease

## 2013-08-15 DIAGNOSIS — L97809 Non-pressure chronic ulcer of other part of unspecified lower leg with unspecified severity: Secondary | ICD-10-CM | POA: Diagnosis not present

## 2013-08-16 ENCOUNTER — Encounter (HOSPITAL_BASED_OUTPATIENT_CLINIC_OR_DEPARTMENT_OTHER): Payer: Self-pay | Admitting: *Deleted

## 2013-08-17 ENCOUNTER — Encounter (HOSPITAL_BASED_OUTPATIENT_CLINIC_OR_DEPARTMENT_OTHER): Payer: Self-pay | Admitting: *Deleted

## 2013-08-17 NOTE — Progress Notes (Addendum)
SPOKE W/ PT VIA HER CELL PHONE. NEEDS PT/INR, CURRENT LAB RESULTS TO BE FAXED FROM DR [facility] OFFICE.  PT VERBALIZED UNDERSTANDING NPO AFTER MN WITH SIPS OF WATER WITH MEDS, WHICH HER NURSE AT [facility] WILL BE INFORMED. ARRIVE AT 1030,  [facility] TO TRANSPORT AND PT DAUGHTER, CINDY LUNSFORD WILL BE HERE DOS. PT WILL BRING VAC AND SUPPLIES. PT IS NO WEIGHT BEARING LEFT LEG BUT CAN STAND AND PIVOT TO STRETCHER.  PT GAVE FIRST SHIFT NURSE SUPERVISOR , ANNIE MILLER AND HER # [phone], SHE IS TO FAX MAR.  RECEIVED MAR FROM [facility].  SPOKE W/ ANNE MILLER NURSE SUPERVISOR WITH INSTRUCTIONS. NPO AFTER MN WITH EXCEPTION SIPS OF WATER WITH DILTIAZEM, SYNTHROID, METOPROLOL, AND PROTONIX .  [facility] WILL TRANSPORT DOS WITH NURSE AIDE.

## 2013-08-20 DIAGNOSIS — S8290XS Unspecified fracture of unspecified lower leg, sequela: Secondary | ICD-10-CM | POA: Diagnosis not present

## 2013-08-20 DIAGNOSIS — A419 Sepsis, unspecified organism: Secondary | ICD-10-CM | POA: Diagnosis present

## 2013-08-20 DIAGNOSIS — K449 Diaphragmatic hernia without obstruction or gangrene: Secondary | ICD-10-CM | POA: Diagnosis present

## 2013-08-20 DIAGNOSIS — K630 Abscess of intestine: Secondary | ICD-10-CM | POA: Diagnosis present

## 2013-08-20 DIAGNOSIS — M1990 Unspecified osteoarthritis, unspecified site: Secondary | ICD-10-CM | POA: Diagnosis present

## 2013-08-20 DIAGNOSIS — K219 Gastro-esophageal reflux disease without esophagitis: Secondary | ICD-10-CM | POA: Diagnosis present

## 2013-08-20 DIAGNOSIS — Z66 Do not resuscitate: Secondary | ICD-10-CM | POA: Diagnosis present

## 2013-08-20 DIAGNOSIS — I4510 Unspecified right bundle-branch block: Secondary | ICD-10-CM | POA: Diagnosis present

## 2013-08-20 DIAGNOSIS — F411 Generalized anxiety disorder: Secondary | ICD-10-CM | POA: Diagnosis present

## 2013-08-20 DIAGNOSIS — E872 Acidosis, unspecified: Secondary | ICD-10-CM | POA: Diagnosis present

## 2013-08-20 DIAGNOSIS — R10819 Abdominal tenderness, unspecified site: Secondary | ICD-10-CM | POA: Diagnosis not present

## 2013-08-20 DIAGNOSIS — I4892 Unspecified atrial flutter: Secondary | ICD-10-CM | POA: Diagnosis present

## 2013-08-20 DIAGNOSIS — N731 Chronic parametritis and pelvic cellulitis: Secondary | ICD-10-CM | POA: Diagnosis present

## 2013-08-20 DIAGNOSIS — E890 Postprocedural hypothyroidism: Secondary | ICD-10-CM | POA: Diagnosis not present

## 2013-08-20 DIAGNOSIS — Z96649 Presence of unspecified artificial hip joint: Secondary | ICD-10-CM | POA: Diagnosis not present

## 2013-08-20 DIAGNOSIS — S82899B Other fracture of unspecified lower leg, initial encounter for open fracture type I or II: Secondary | ICD-10-CM | POA: Diagnosis not present

## 2013-08-20 DIAGNOSIS — Z9181 History of falling: Secondary | ICD-10-CM | POA: Diagnosis not present

## 2013-08-20 DIAGNOSIS — M899 Disorder of bone, unspecified: Secondary | ICD-10-CM | POA: Diagnosis present

## 2013-08-20 DIAGNOSIS — R262 Difficulty in walking, not elsewhere classified: Secondary | ICD-10-CM | POA: Diagnosis not present

## 2013-08-20 DIAGNOSIS — E785 Hyperlipidemia, unspecified: Secondary | ICD-10-CM | POA: Diagnosis present

## 2013-08-20 DIAGNOSIS — R279 Unspecified lack of coordination: Secondary | ICD-10-CM | POA: Diagnosis not present

## 2013-08-20 DIAGNOSIS — G4700 Insomnia, unspecified: Secondary | ICD-10-CM | POA: Diagnosis not present

## 2013-08-20 DIAGNOSIS — T8189XA Other complications of procedures, not elsewhere classified, initial encounter: Secondary | ICD-10-CM | POA: Diagnosis not present

## 2013-08-20 DIAGNOSIS — K5900 Constipation, unspecified: Secondary | ICD-10-CM | POA: Diagnosis present

## 2013-08-20 DIAGNOSIS — R197 Diarrhea, unspecified: Secondary | ICD-10-CM | POA: Diagnosis not present

## 2013-08-20 DIAGNOSIS — R1031 Right lower quadrant pain: Secondary | ICD-10-CM | POA: Diagnosis present

## 2013-08-20 DIAGNOSIS — Z7901 Long term (current) use of anticoagulants: Secondary | ICD-10-CM | POA: Diagnosis not present

## 2013-08-20 DIAGNOSIS — R1084 Generalized abdominal pain: Secondary | ICD-10-CM | POA: Diagnosis not present

## 2013-08-20 DIAGNOSIS — K5732 Diverticulitis of large intestine without perforation or abscess without bleeding: Secondary | ICD-10-CM | POA: Diagnosis present

## 2013-08-20 DIAGNOSIS — Z6835 Body mass index (BMI) 35.0-35.9, adult: Secondary | ICD-10-CM | POA: Diagnosis not present

## 2013-08-20 DIAGNOSIS — D638 Anemia in other chronic diseases classified elsewhere: Secondary | ICD-10-CM | POA: Diagnosis present

## 2013-08-20 DIAGNOSIS — R109 Unspecified abdominal pain: Secondary | ICD-10-CM | POA: Diagnosis present

## 2013-08-20 DIAGNOSIS — Z981 Arthrodesis status: Secondary | ICD-10-CM | POA: Diagnosis not present

## 2013-08-20 DIAGNOSIS — N390 Urinary tract infection, site not specified: Secondary | ICD-10-CM | POA: Diagnosis not present

## 2013-08-20 DIAGNOSIS — Z5189 Encounter for other specified aftercare: Secondary | ICD-10-CM | POA: Diagnosis not present

## 2013-08-20 DIAGNOSIS — M6281 Muscle weakness (generalized): Secondary | ICD-10-CM | POA: Diagnosis not present

## 2013-08-20 DIAGNOSIS — I10 Essential (primary) hypertension: Secondary | ICD-10-CM | POA: Diagnosis present

## 2013-08-20 DIAGNOSIS — S81009A Unspecified open wound, unspecified knee, initial encounter: Secondary | ICD-10-CM | POA: Diagnosis present

## 2013-08-20 DIAGNOSIS — K560 Paralytic ileus: Secondary | ICD-10-CM | POA: Diagnosis not present

## 2013-08-20 DIAGNOSIS — D7589 Other specified diseases of blood and blood-forming organs: Secondary | ICD-10-CM | POA: Diagnosis not present

## 2013-08-20 DIAGNOSIS — Z79899 Other long term (current) drug therapy: Secondary | ICD-10-CM | POA: Diagnosis not present

## 2013-08-20 DIAGNOSIS — K651 Peritoneal abscess: Secondary | ICD-10-CM | POA: Diagnosis not present

## 2013-08-20 DIAGNOSIS — B999 Unspecified infectious disease: Secondary | ICD-10-CM | POA: Diagnosis not present

## 2013-08-20 DIAGNOSIS — E039 Hypothyroidism, unspecified: Secondary | ICD-10-CM | POA: Diagnosis present

## 2013-08-20 DIAGNOSIS — D509 Iron deficiency anemia, unspecified: Secondary | ICD-10-CM | POA: Diagnosis present

## 2013-08-20 DIAGNOSIS — E669 Obesity, unspecified: Secondary | ICD-10-CM | POA: Diagnosis present

## 2013-08-20 DIAGNOSIS — K5289 Other specified noninfective gastroenteritis and colitis: Secondary | ICD-10-CM | POA: Diagnosis not present

## 2013-08-20 DIAGNOSIS — Q762 Congenital spondylolisthesis: Secondary | ICD-10-CM | POA: Diagnosis not present

## 2013-08-20 DIAGNOSIS — R21 Rash and other nonspecific skin eruption: Secondary | ICD-10-CM | POA: Diagnosis not present

## 2013-08-20 DIAGNOSIS — M159 Polyosteoarthritis, unspecified: Secondary | ICD-10-CM | POA: Diagnosis not present

## 2013-08-20 DIAGNOSIS — L270 Generalized skin eruption due to drugs and medicaments taken internally: Secondary | ICD-10-CM | POA: Diagnosis not present

## 2013-08-20 DIAGNOSIS — E876 Hypokalemia: Secondary | ICD-10-CM | POA: Diagnosis not present

## 2013-08-20 DIAGNOSIS — I5032 Chronic diastolic (congestive) heart failure: Secondary | ICD-10-CM | POA: Diagnosis present

## 2013-08-20 DIAGNOSIS — F3289 Other specified depressive episodes: Secondary | ICD-10-CM | POA: Diagnosis not present

## 2013-08-20 DIAGNOSIS — IMO0002 Reserved for concepts with insufficient information to code with codable children: Secondary | ICD-10-CM | POA: Diagnosis not present

## 2013-08-20 DIAGNOSIS — I739 Peripheral vascular disease, unspecified: Secondary | ICD-10-CM | POA: Diagnosis not present

## 2013-08-20 DIAGNOSIS — K2970 Gastritis, unspecified, without bleeding: Secondary | ICD-10-CM | POA: Diagnosis not present

## 2013-08-20 DIAGNOSIS — I509 Heart failure, unspecified: Secondary | ICD-10-CM | POA: Diagnosis present

## 2013-08-20 DIAGNOSIS — R112 Nausea with vomiting, unspecified: Secondary | ICD-10-CM | POA: Diagnosis not present

## 2013-08-20 DIAGNOSIS — R079 Chest pain, unspecified: Secondary | ICD-10-CM | POA: Diagnosis not present

## 2013-08-20 DIAGNOSIS — I4891 Unspecified atrial fibrillation: Secondary | ICD-10-CM | POA: Diagnosis present

## 2013-08-22 ENCOUNTER — Encounter (HOSPITAL_BASED_OUTPATIENT_CLINIC_OR_DEPARTMENT_OTHER)
Admission: RE | Disposition: A | Discharge status: Home / Self Care | Payer: Self-pay | Source: Ambulatory Visit | Attending: Plastic Surgery

## 2013-08-22 ENCOUNTER — Encounter (HOSPITAL_BASED_OUTPATIENT_CLINIC_OR_DEPARTMENT_OTHER): Payer: Medicare Other | Admitting: Anesthesiology

## 2013-08-22 ENCOUNTER — Encounter (HOSPITAL_BASED_OUTPATIENT_CLINIC_OR_DEPARTMENT_OTHER): Payer: Medicare Other | Attending: Plastic Surgery

## 2013-08-22 ENCOUNTER — Ambulatory Visit (HOSPITAL_BASED_OUTPATIENT_CLINIC_OR_DEPARTMENT_OTHER)
Admission: RE | Admit: 2013-08-22 | Discharge: 2013-08-22 | Disposition: A | Discharge status: Home / Self Care | Payer: Medicare Other | Source: Ambulatory Visit | Attending: Plastic Surgery | Admitting: Plastic Surgery

## 2013-08-22 ENCOUNTER — Encounter (HOSPITAL_BASED_OUTPATIENT_CLINIC_OR_DEPARTMENT_OTHER): Payer: Self-pay | Admitting: Plastic Surgery

## 2013-08-22 ENCOUNTER — Ambulatory Visit (HOSPITAL_BASED_OUTPATIENT_CLINIC_OR_DEPARTMENT_OTHER): Payer: Medicare Other | Admitting: Anesthesiology

## 2013-08-22 DIAGNOSIS — K219 Gastro-esophageal reflux disease without esophagitis: Secondary | ICD-10-CM | POA: Insufficient documentation

## 2013-08-22 DIAGNOSIS — Y831 Surgical operation with implant of artificial internal device as the cause of abnormal reaction of the patient, or of later complication, without mention of misadventure at the time of the procedure: Secondary | ICD-10-CM | POA: Insufficient documentation

## 2013-08-22 DIAGNOSIS — I10 Essential (primary) hypertension: Secondary | ICD-10-CM | POA: Insufficient documentation

## 2013-08-22 DIAGNOSIS — I872 Venous insufficiency (chronic) (peripheral): Secondary | ICD-10-CM | POA: Insufficient documentation

## 2013-08-22 DIAGNOSIS — S81009A Unspecified open wound, unspecified knee, initial encounter: Secondary | ICD-10-CM | POA: Diagnosis not present

## 2013-08-22 DIAGNOSIS — L97809 Non-pressure chronic ulcer of other part of unspecified lower leg with unspecified severity: Secondary | ICD-10-CM | POA: Insufficient documentation

## 2013-08-22 DIAGNOSIS — K449 Diaphragmatic hernia without obstruction or gangrene: Secondary | ICD-10-CM | POA: Insufficient documentation

## 2013-08-22 DIAGNOSIS — T8189XA Other complications of procedures, not elsewhere classified, initial encounter: Secondary | ICD-10-CM | POA: Insufficient documentation

## 2013-08-22 DIAGNOSIS — S81809A Unspecified open wound, unspecified lower leg, initial encounter: Secondary | ICD-10-CM | POA: Diagnosis not present

## 2013-08-22 DIAGNOSIS — I4510 Unspecified right bundle-branch block: Secondary | ICD-10-CM | POA: Insufficient documentation

## 2013-08-22 DIAGNOSIS — S8290XS Unspecified fracture of unspecified lower leg, sequela: Secondary | ICD-10-CM | POA: Diagnosis not present

## 2013-08-22 DIAGNOSIS — I4891 Unspecified atrial fibrillation: Secondary | ICD-10-CM | POA: Insufficient documentation

## 2013-08-22 DIAGNOSIS — S82899B Other fracture of unspecified lower leg, initial encounter for open fracture type I or II: Secondary | ICD-10-CM | POA: Diagnosis not present

## 2013-08-22 DIAGNOSIS — I739 Peripheral vascular disease, unspecified: Secondary | ICD-10-CM | POA: Insufficient documentation

## 2013-08-22 HISTORY — DX: Localized edema: R60.0

## 2013-08-22 HISTORY — DX: Unspecified right bundle-branch block: I45.10

## 2013-08-22 HISTORY — PX: APPLICATION OF A-CELL OF EXTREMITY: SHX6303

## 2013-08-22 HISTORY — DX: Gastro-esophageal reflux disease without esophagitis: K21.9

## 2013-08-22 HISTORY — DX: Personal history of other diseases of the digestive system: Z87.19

## 2013-08-22 HISTORY — DX: Diverticulosis of large intestine without perforation or abscess without bleeding: K57.30

## 2013-08-22 HISTORY — DX: Other persistent atrial fibrillation: I48.19

## 2013-08-22 HISTORY — DX: Other constipation: K59.09

## 2013-08-22 HISTORY — DX: Hyperlipidemia, unspecified: E78.5

## 2013-08-22 HISTORY — DX: Spondylolysis, lumbar region: M43.06

## 2013-08-22 HISTORY — DX: Personal history of diseases of the skin and subcutaneous tissue: Z87.2

## 2013-08-22 HISTORY — DX: Personal history of other infectious and parasitic diseases: Z86.19

## 2013-08-22 HISTORY — DX: Unspecified open wound, left lower leg, initial encounter: S81.802A

## 2013-08-22 HISTORY — DX: Long term (current) use of anticoagulants: Z79.01

## 2013-08-22 LAB — PROTIME-INR
INR: 1.14 (ref 0.00–1.49)
Prothrombin Time: 14.6 seconds (ref 11.6–15.2)

## 2013-08-22 SURGERY — APPLICATION OF A-CELL OF EXTREMITY
Anesthesia: General | Site: Leg Lower | Laterality: Left

## 2013-08-22 MED ORDER — SODIUM CHLORIDE 0.9 % IR SOLN
Status: DC | PRN
  Administered 2013-08-22: 500 mL

## 2013-08-22 MED ORDER — FENTANYL CITRATE 0.05 MG/ML IJ SOLN
INTRAMUSCULAR | Status: DC | PRN
  Administered 2013-08-22 (×2): 12.5 ug via INTRAVENOUS
  Administered 2013-08-22: 25 ug via INTRAVENOUS
  Administered 2013-08-22 (×4): 12.5 ug via INTRAVENOUS

## 2013-08-22 MED ORDER — ACETAMINOPHEN 10 MG/ML IV SOLN
INTRAVENOUS | Status: DC | PRN
  Administered 2013-08-22: 1000 mg via INTRAVENOUS

## 2013-08-22 MED ORDER — ONDANSETRON HCL 4 MG/2ML IJ SOLN
INTRAMUSCULAR | Status: DC | PRN
  Administered 2013-08-22: 4 mg via INTRAVENOUS

## 2013-08-22 MED ORDER — KETOROLAC TROMETHAMINE 30 MG/ML IJ SOLN
INTRAMUSCULAR | Status: DC | PRN
  Administered 2013-08-22: 30 mg via INTRAVENOUS

## 2013-08-22 MED ORDER — LACTATED RINGERS IV SOLN
INTRAVENOUS | Status: DC | PRN
  Administered 2013-08-22: 11:00:00 via INTRAVENOUS

## 2013-08-22 MED ORDER — FENTANYL CITRATE 0.05 MG/ML IJ SOLN
25.0000 ug | INTRAMUSCULAR | Status: DC | PRN
  Filled 2013-08-22: qty 1

## 2013-08-22 MED ORDER — LIDOCAINE HCL (CARDIAC) 20 MG/ML IV SOLN
INTRAVENOUS | Status: DC | PRN
  Administered 2013-08-22: 40 mg via INTRAVENOUS

## 2013-08-22 MED ORDER — CEFAZOLIN SODIUM-DEXTROSE 2-3 GM-% IV SOLR
2.0000 g | Freq: Once | INTRAVENOUS | Status: AC
  Administered 2013-08-22: 2 g via INTRAVENOUS
  Filled 2013-08-22: qty 50

## 2013-08-22 MED ORDER — LACTATED RINGERS IV SOLN
INTRAVENOUS | Status: DC
  Filled 2013-08-22: qty 1000

## 2013-08-22 MED ORDER — PROPOFOL 10 MG/ML IV BOLUS
INTRAVENOUS | Status: DC | PRN
  Administered 2013-08-22: 150 mg via INTRAVENOUS

## 2013-08-22 MED ORDER — LACTATED RINGERS IV SOLN
INTRAVENOUS | Status: DC
  Administered 2013-08-22: 11:00:00 via INTRAVENOUS
  Filled 2013-08-22: qty 1000

## 2013-08-22 MED ORDER — SODIUM CHLORIDE 0.9 % IR SOLN
Status: DC | PRN
  Administered 2013-08-22: 13:00:00

## 2013-08-22 MED ORDER — FENTANYL CITRATE 0.05 MG/ML IJ SOLN
INTRAMUSCULAR | Status: AC
  Filled 2013-08-22: qty 4

## 2013-08-22 MED ORDER — DEXAMETHASONE SODIUM PHOSPHATE 4 MG/ML IJ SOLN
INTRAMUSCULAR | Status: DC | PRN
  Administered 2013-08-22: 4 mg via INTRAVENOUS

## 2013-08-22 SURGICAL SUPPLY — 98 items
APL SKNCLS STERI-STRIP NONHPOA (GAUZE/BANDAGES/DRESSINGS)
BAG DECANTER FOR FLEXI CONT (MISCELLANEOUS) IMPLANT
BANDAGE ELASTIC 3 VELCRO ST LF (GAUZE/BANDAGES/DRESSINGS) IMPLANT
BANDAGE ELASTIC 4 VELCRO ST LF (GAUZE/BANDAGES/DRESSINGS) ×2 IMPLANT
BANDAGE ELASTIC 6 VELCRO ST LF (GAUZE/BANDAGES/DRESSINGS) IMPLANT
BENZOIN TINCTURE PRP APPL 2/3 (GAUZE/BANDAGES/DRESSINGS) IMPLANT
BLADE MINI RND TIP GREEN BEAV (BLADE) IMPLANT
BLADE SURG 10 STRL SS (BLADE) IMPLANT
BLADE SURG 15 STRL LF DISP TIS (BLADE) ×1 IMPLANT
BLADE SURG 15 STRL SS (BLADE) ×3
BNDG CMPR 9X4 STRL LF SNTH (GAUZE/BANDAGES/DRESSINGS)
BNDG COHESIVE 1X5 TAN STRL LF (GAUZE/BANDAGES/DRESSINGS) IMPLANT
BNDG COHESIVE 4X5 TAN NS LF (GAUZE/BANDAGES/DRESSINGS) IMPLANT
BNDG ESMARK 4X9 LF (GAUZE/BANDAGES/DRESSINGS) IMPLANT
BNDG GAUZE ELAST 4 BULKY (GAUZE/BANDAGES/DRESSINGS) IMPLANT
CANISTER SUCT LVC 12 LTR MEDI- (MISCELLANEOUS) IMPLANT
CANISTER SUCTION 1200CC (MISCELLANEOUS) IMPLANT
CANISTER SUCTION 2500CC (MISCELLANEOUS) IMPLANT
CHLORAPREP W/TINT 26ML (MISCELLANEOUS) IMPLANT
CLOSURE WOUND 1/2 X4 (GAUZE/BANDAGES/DRESSINGS)
CLOTH BEACON ORANGE TIMEOUT ST (SAFETY) ×3 IMPLANT
CORDS BIPOLAR (ELECTRODE) IMPLANT
COVER MAYO STAND STRL (DRAPES) ×3 IMPLANT
COVER TABLE BACK 60X90 (DRAPES) ×3 IMPLANT
DECANTER SPIKE VIAL GLASS SM (MISCELLANEOUS) IMPLANT
DRAIN PENROSE 18X1/2 LTX STRL (DRAIN) ×3 IMPLANT
DRAPE EXTREMITY T 121X128X90 (DRAPE) ×2 IMPLANT
DRAPE INCISE IOBAN 66X45 STRL (DRAPES) ×2 IMPLANT
DRAPE LG THREE QUARTER DISP (DRAPES) IMPLANT
DRSG ADAPTIC 3X8 NADH LF (GAUZE/BANDAGES/DRESSINGS) ×2 IMPLANT
DRSG EMULSION OIL 3X3 NADH (GAUZE/BANDAGES/DRESSINGS) IMPLANT
ELECT NDL BLADE 2-5/6 (NEEDLE) IMPLANT
ELECT NDL TIP 2.8 STRL (NEEDLE) IMPLANT
ELECT NEEDLE BLADE 2-5/6 (NEEDLE) IMPLANT
ELECT NEEDLE TIP 2.8 STRL (NEEDLE) IMPLANT
ELECT REM PT RETURN 9FT ADLT (ELECTROSURGICAL) ×3
ELECTRODE REM PT RTRN 9FT ADLT (ELECTROSURGICAL) ×1 IMPLANT
GAUZE SPONGE 4X4 12PLY STRL LF (GAUZE/BANDAGES/DRESSINGS) IMPLANT
GAUZE XEROFORM 1X8 LF (GAUZE/BANDAGES/DRESSINGS) IMPLANT
GAUZE XEROFORM 5X9 LF (GAUZE/BANDAGES/DRESSINGS) IMPLANT
GLOVE BIO SURGEON STRL SZ 6.5 (GLOVE) ×3 IMPLANT
GLOVE BIO SURGEONS STRL SZ 6.5 (GLOVE) ×2
GOWN PREVENTION PLUS XLARGE (GOWN DISPOSABLE) ×1 IMPLANT
GOWN STRL REUS W/ TWL LRG LVL3 (GOWN DISPOSABLE) IMPLANT
GOWN STRL REUS W/TWL LRG LVL3 (GOWN DISPOSABLE) ×6
HANDPIECE INTERPULSE COAX TIP (DISPOSABLE)
IV NS IRRIG 3000ML ARTHROMATIC (IV SOLUTION) IMPLANT
MATRIX SURGICAL PSM 7X10CM (Tissue) ×2 IMPLANT
MICROMATRIX 1000MG (Tissue) ×3 IMPLANT
NDL HYPO 30GX1 BEV (NEEDLE) IMPLANT
NEEDLE 27GAX1X1/2 (NEEDLE) IMPLANT
NEEDLE HYPO 30GX1 BEV (NEEDLE) IMPLANT
NS IRRIG 1000ML POUR BTL (IV SOLUTION) ×3 IMPLANT
PACK BASIN DAY SURGERY FS (CUSTOM PROCEDURE TRAY) ×3 IMPLANT
PAD ABD 8X10 STRL (GAUZE/BANDAGES/DRESSINGS) IMPLANT
PADDING CAST ABS 3INX4YD NS (CAST SUPPLIES)
PADDING CAST ABS 4INX4YD NS (CAST SUPPLIES)
PADDING CAST ABS COTTON 3X4 (CAST SUPPLIES) IMPLANT
PADDING CAST ABS COTTON 4X4 ST (CAST SUPPLIES) IMPLANT
PENCIL BUTTON HOLSTER BLD 10FT (ELECTRODE) ×2 IMPLANT
SET HNDPC FAN SPRY TIP SCT (DISPOSABLE) IMPLANT
SLEEVE SCD COMPRESS KNEE MED (MISCELLANEOUS) ×2 IMPLANT
SOLUTION PARTIC MCRMTRX 1000MG (Tissue) IMPLANT
SPLINT PLASTER CAST XFAST 3X15 (CAST SUPPLIES) IMPLANT
SPLINT PLASTER XTRA FASTSET 3X (CAST SUPPLIES)
SPONGE GAUZE 4X4 12PLY (GAUZE/BANDAGES/DRESSINGS) ×3 IMPLANT
SPONGE LAP 18X18 X RAY DECT (DISPOSABLE) IMPLANT
SPONGE LAP 4X18 X RAY DECT (DISPOSABLE) IMPLANT
STAPLER VISISTAT 35W (STAPLE) IMPLANT
STOCKINETTE 4X48 STRL (DRAPES) IMPLANT
STOCKINETTE 6  STRL (DRAPES) ×2
STOCKINETTE 6 STRL (DRAPES) ×1 IMPLANT
STOCKINETTE IMPERVIOUS LG (DRAPES) ×2 IMPLANT
STRIP CLOSURE SKIN 1/2X4 (GAUZE/BANDAGES/DRESSINGS) IMPLANT
SUCTION FRAZIER TIP 10 FR DISP (SUCTIONS) IMPLANT
SURGILUBE 2OZ TUBE FLIPTOP (MISCELLANEOUS) ×2 IMPLANT
SUT ETHILON 3 0 PS 1 (SUTURE) IMPLANT
SUT ETHILON 4 0 P 3 18 (SUTURE) IMPLANT
SUT ETHILON 5 0 PS 2 18 (SUTURE) ×2 IMPLANT
SUT PROLENE 3 0 PS 2 (SUTURE) IMPLANT
SUT SILK 3 0 PS 1 (SUTURE) IMPLANT
SUT VIC AB 3-0 FS2 27 (SUTURE) IMPLANT
SUT VIC AB 5-0 P-3 18X BRD (SUTURE) IMPLANT
SUT VIC AB 5-0 P3 18 (SUTURE)
SUT VIC AB 5-0 PS2 18 (SUTURE) IMPLANT
SYR BULB IRRIGATION 50ML (SYRINGE) ×2 IMPLANT
SYR CONTROL 10ML LL (SYRINGE) ×3 IMPLANT
TAPE HYPAFIX 6 X30' (GAUZE/BANDAGES/DRESSINGS)
TAPE HYPAFIX 6X30 (GAUZE/BANDAGES/DRESSINGS) IMPLANT
TIP FLEX 45CM EVICEL (HEMOSTASIS) IMPLANT
TIP RIGID 35CM EVICEL (HEMOSTASIS) IMPLANT
TOWEL OR 17X24 6PK STRL BLUE (TOWEL DISPOSABLE) ×5 IMPLANT
TRAY DSU PREP LF (CUSTOM PROCEDURE TRAY) ×2 IMPLANT
TUBE CONNECTING 12'X1/4 (SUCTIONS) ×1
TUBE CONNECTING 12X1/4 (SUCTIONS) ×1 IMPLANT
UNDERPAD 30X30 INCONTINENT (UNDERPADS AND DIAPERS) ×3 IMPLANT
WATER STERILE IRR 1000ML POUR (IV SOLUTION) ×1 IMPLANT
YANKAUER SUCT BULB TIP NO VENT (SUCTIONS) ×2 IMPLANT

## 2013-08-22 NOTE — Anesthesia Procedure Notes (Signed)
Procedure Name: LMA Insertion Date/Time: 08/22/2013 12:43 PM Performed by: Justice Rocher Pre-anesthesia Checklist: Patient identified, Emergency Drugs available, Suction available and Patient being monitored Patient Re-evaluated:Patient Re-evaluated prior to inductionOxygen Delivery Method: Circle System Utilized Preoxygenation: Pre-oxygenation with 100% oxygen Intubation Type: IV induction Ventilation: Mask ventilation without difficulty LMA: LMA inserted LMA Size: 4.0 Number of attempts: 1 Airway Equipment and Method: bite block Placement Confirmation: positive ETCO2 Tube secured with: Tape Dental Injury: Teeth and Oropharynx as per pre-operative assessment

## 2013-08-22 NOTE — H&P (View-Only) (Signed)
[facility]  NAME:  [name], [name] NO.:  [id number]  MEDICAL RECORD NO.:  [id number]      DATE OF BIRTH:  25-May-1925  PHYSICIAN:  Theodoro Kos, DO       VISIT DATE:  07/25/2013                                  OFFICE VISIT   HISTORY:  The patient is an 80 year old female who had a fall in her kitchen and ended up with a open fracture of the lower extremities.  She underwent repair of the fracture and has the open wound.  We were consulted.  She had debridement and ACell placement, and she has been treated with a VAC.  She is doing extremely well.  She is filling in very well.  This is very encouraging.  We will continue with the VAC, triple antibiotic ointment for the heal, and we will plan to see her back in a week for VAC change.     Theodoro Kos, DO     CS/MEDQ  D:  07/25/2013  T:  07/25/2013  Job:  [id number]

## 2013-08-22 NOTE — Brief Op Note (Signed)
08/22/2013  1:16 PM  PATIENT:  [name]  80 y.o. female  PRE-OPERATIVE DIAGNOSIS:  OPEN WOUND OF LEFT LOWER EXTREMITY  POST-OPERATIVE DIAGNOSIS:  * No post-op diagnosis entered *  PROCEDURE:  Procedure(s): APPLICATION OF A-CELL/VAC TO LOWER LEFT LEG WOUND (Left)  SURGEON:  Surgeon(s) and Role:    * Claire Sanger, DO - Primary  PHYSICIAN ASSISTANT: Shawn Rayburn, PA  ASSISTANTS: none   ANESTHESIA:   general  EBL:  Total I/O In: 500 [I.V.:500] Out: -   BLOOD ADMINISTERED:none  DRAINS: none   LOCAL MEDICATIONS USED:  NONE  SPECIMEN:  Source of Specimen:  bone  DISPOSITION OF SPECIMEN:  PATHOLOGY  COUNTS:  YES  TOURNIQUET:  * No tourniquets in log *  DICTATION: .Dragon Dictation  PLAN OF CARE: Discharge to home after PACU  PATIENT DISPOSITION:  PACU - hemodynamically stable.   Delay start of Pharmacological VTE agent (>24hrs) due to surgical blood loss or risk of bleeding: no

## 2013-08-22 NOTE — Anesthesia Postprocedure Evaluation (Signed)
  Anesthesia Post-op Note  Patient: [name]  Procedure(s) Performed: Procedure(s) (LRB): APPLICATION OF A-CELL/VAC TO LOWER LEFT LEG WOUND (Left)  Patient Location: PACU  Anesthesia Type: General  Level of Consciousness: awake and alert   Airway and Oxygen Therapy: Patient Spontanous Breathing  Post-op Pain: mild  Post-op Assessment: Post-op Vital signs reviewed, Patient's Cardiovascular Status Stable, Respiratory Function Stable, Patent Airway and No signs of Nausea or vomiting  Last Vitals:  Filed Vitals:   08/22/13 1048  BP: 133/81  Pulse: 95  Temp: 36.6 C  Resp: 18    Post-op Vital Signs: stable   Complications: No apparent anesthesia complications

## 2013-08-22 NOTE — Op Note (Signed)
Operative Note   DATE OF OPERATION: 08/22/2013  LOCATION: [place]  SURGICAL DIVISION: Plastic Surgery  PREOPERATIVE DIAGNOSES:  Left leg wound secondary to open fracture  POSTOPERATIVE DIAGNOSES:  same  PROCEDURE:  Preparation of left leg wound for placement of Acell (powder 1 gm and sheet 7 x 10), VAC 4 x 1 x 8 cm.  SURGEON: Theodoro Kos, DO  ASSISTANT:  Shawn Rayburn, PA  ANESTHESIA:  General.   COMPLICATIONS: None.   INDICATIONS FOR PROCEDURE:  The patient, [name], is a 80 y.o. female born on Nov 04, 1925, is here for treatment of a open left leg tibial fracture wound.   CONSENT:  Informed consent was obtained directly from the patient. Risks, benefits and alternatives were fully discussed. Specific risks including but not limited to bleeding, infection, hematoma, seroma, scarring, pain, implant infection, implant extrusion, capsular contracture, asymmetry, wound healing problems, and need for further surgery were all discussed. The patient did have an ample opportunity to have questions answered to satisfaction.   DESCRIPTION OF PROCEDURE:  The patient was taken to the operating room. SCDs was placed on right leg. The patient's operative site was prepped and draped in a sterile fashion. A time out was performed and all information was confirmed to be correct.  General anesthesia was administered.  There was 1 cm of exposted bone at the site of the fracture.  The ronquer and osteotome was used to remove the exposed bone.  This was sent to pathology for precaution.  The area was irrigated with antibiotic solution.   The Acell powder was placed followed by the Acell sheet and secured with 5-0 Vicryl.  Adaptic was placed with a VAC and set at 75 mmHg pressure. A boot was applied.  The patient tolerated the procedure well.  There were no complications. The patient was allowed to wake from anesthesia, extubated and taken to the recovery room in satisfactory  condition.

## 2013-08-22 NOTE — Anesthesia Preprocedure Evaluation (Addendum)
Anesthesia Evaluation  Patient identified by MRN, date of birth, ID band Patient awake    Reviewed: Allergy & Precautions, H&P , NPO status , Patient's Chart, lab work & pertinent test results, reviewed documented beta blocker date and time   Airway Mallampati: II TM Distance: >3 FB Neck ROM: Full    Dental  (+) Dental Advisory Given, Caps 2 front upper are capped:   Pulmonary neg pulmonary ROS, shortness of breath and with exertion,  breath sounds clear to auscultation  Pulmonary exam normal       Cardiovascular hypertension, Pt. on medications and Pt. on home beta blockers + Peripheral Vascular Disease + dysrhythmias Atrial Fibrillation Rhythm:Regular Rate:Normal  RBBB. Chronic AF   Neuro/Psych negative neurological ROS  negative psych ROS   GI/Hepatic Neg liver ROS, hiatal hernia, PUD, GERD-  Medicated and Controlled,  Endo/Other  negative endocrine ROSHypothyroidism   Renal/GU negative Renal ROS  negative genitourinary   Musculoskeletal   Abdominal   Peds  Hematology negative hematology ROS (+)   Anesthesia Other Findings   Reproductive/Obstetrics negative OB ROS                          Anesthesia Physical Anesthesia Plan  ASA: III  Anesthesia Plan: General   Post-op Pain Management:    Induction: Intravenous  Airway Management Planned: LMA  Additional Equipment:   Intra-op Plan:   Post-operative Plan:   Informed Consent: I have reviewed the patients History and Physical, chart, labs and discussed the procedure including the risks, benefits and alternatives for the proposed anesthesia with the patient or authorized representative who has indicated his/her understanding and acceptance.   Dental Advisory Given  Plan Discussed with: CRNA and Surgeon  Anesthesia Plan Comments:         Anesthesia Quick Evaluation

## 2013-08-22 NOTE — Discharge Instructions (Signed)
Continue vac       Post Anesthesia Home Care Instructions  Activity: Get plenty of rest for the remainder of the day. A responsible adult should stay with you for 24 hours following the procedure.  For the next 24 hours, DO NOT: -Drive a car -Paediatric nurse -Drink alcoholic beverages -Take any medication unless instructed by your physician -Make any legal decisions or sign important papers.  Meals: Start with liquid foods such as gelatin or soup. Progress to regular foods as tolerated. Avoid greasy, spicy, heavy foods. If nausea and/or vomiting occur, drink only clear liquids until the nausea and/or vomiting subsides. Call your physician if vomiting continues.  Special Instructions/Symptoms: Your throat may feel dry or sore from the anesthesia or the breathing tube placed in your throat during surgery. If this causes discomfort, gargle with warm salt water. The discomfort should disappear within 24 hours.

## 2013-08-22 NOTE — Interval H&P Note (Signed)
History and Physical Interval Note:  08/22/2013 8:18 AM  [name]  has presented today for surgery, with the diagnosis of OPEN WOUND OF LEFT LOWER EXTREMITY  The various methods of treatment have been discussed with the patient and family. After consideration of risks, benefits and other options for treatment, the patient has consented to  Procedure(s): APPLICATION OF A-CELL/VAC TO LOWER LEFT LEG WOUND (Left) as a surgical intervention .  The patient's history has been reviewed, patient examined, no change in status, stable for surgery.  I have reviewed the patient's chart and labs.  Questions were answered to the patient's satisfaction.     SANGER,CLAIRE

## 2013-08-22 NOTE — Transfer of Care (Signed)
Immediate Anesthesia Transfer of Care Note  Patient: [name]  Procedure(s) Performed: Procedure(s) (LRB): APPLICATION OF A-CELL/VAC TO LOWER LEFT LEG WOUND (Left)  Patient Location: PACU  Anesthesia Type: General  Level of Consciousness: awake, sedated, patient cooperative and responds to stimulation  Airway & Oxygen Therapy: Patient Spontanous Breathing and Patient connected to face mask oxygen  Post-op Assessment: Report given to PACU RN, Post -op Vital signs reviewed and stable and Patient moving all extremities  Post vital signs: Reviewed and stable  Complications: No apparent anesthesia complications

## 2013-08-24 ENCOUNTER — Encounter (HOSPITAL_BASED_OUTPATIENT_CLINIC_OR_DEPARTMENT_OTHER): Payer: Self-pay | Admitting: Plastic Surgery

## 2013-08-24 DIAGNOSIS — R109 Unspecified abdominal pain: Secondary | ICD-10-CM | POA: Diagnosis not present

## 2013-08-25 ENCOUNTER — Inpatient Hospital Stay (HOSPITAL_COMMUNITY)
Admission: EM | Admit: 2013-08-25 | Discharge: 2013-09-08 | DRG: 872 | Disposition: A | Discharge status: Skilled Nursing Facility | Payer: Medicare Other | Attending: Internal Medicine | Admitting: Internal Medicine

## 2013-08-25 ENCOUNTER — Telehealth: Payer: Self-pay | Admitting: *Deleted

## 2013-08-25 ENCOUNTER — Emergency Department (HOSPITAL_COMMUNITY): Payer: Medicare Other

## 2013-08-25 ENCOUNTER — Encounter (HOSPITAL_COMMUNITY): Payer: Self-pay | Admitting: Emergency Medicine

## 2013-08-25 DIAGNOSIS — R1031 Right lower quadrant pain: Secondary | ICD-10-CM | POA: Diagnosis present

## 2013-08-25 DIAGNOSIS — B999 Unspecified infectious disease: Secondary | ICD-10-CM | POA: Diagnosis not present

## 2013-08-25 DIAGNOSIS — Z0000 Encounter for general adult medical examination without abnormal findings: Secondary | ICD-10-CM

## 2013-08-25 DIAGNOSIS — Z452 Encounter for adjustment and management of vascular access device: Secondary | ICD-10-CM | POA: Diagnosis not present

## 2013-08-25 DIAGNOSIS — S81809A Unspecified open wound, unspecified lower leg, initial encounter: Secondary | ICD-10-CM

## 2013-08-25 DIAGNOSIS — M6281 Muscle weakness (generalized): Secondary | ICD-10-CM | POA: Diagnosis not present

## 2013-08-25 DIAGNOSIS — K560 Paralytic ileus: Secondary | ICD-10-CM | POA: Diagnosis not present

## 2013-08-25 DIAGNOSIS — R197 Diarrhea, unspecified: Secondary | ICD-10-CM | POA: Diagnosis not present

## 2013-08-25 DIAGNOSIS — Z981 Arthrodesis status: Secondary | ICD-10-CM

## 2013-08-25 DIAGNOSIS — Z79899 Other long term (current) drug therapy: Secondary | ICD-10-CM | POA: Diagnosis not present

## 2013-08-25 DIAGNOSIS — R21 Rash and other nonspecific skin eruption: Secondary | ICD-10-CM | POA: Diagnosis not present

## 2013-08-25 DIAGNOSIS — M1990 Unspecified osteoarthritis, unspecified site: Secondary | ICD-10-CM

## 2013-08-25 DIAGNOSIS — E872 Acidosis, unspecified: Secondary | ICD-10-CM | POA: Diagnosis present

## 2013-08-25 DIAGNOSIS — S8290XD Unspecified fracture of unspecified lower leg, subsequent encounter for closed fracture with routine healing: Secondary | ICD-10-CM | POA: Diagnosis not present

## 2013-08-25 DIAGNOSIS — R188 Other ascites: Secondary | ICD-10-CM | POA: Diagnosis not present

## 2013-08-25 DIAGNOSIS — R109 Unspecified abdominal pain: Secondary | ICD-10-CM | POA: Diagnosis not present

## 2013-08-25 DIAGNOSIS — K631 Perforation of intestine (nontraumatic): Secondary | ICD-10-CM | POA: Diagnosis not present

## 2013-08-25 DIAGNOSIS — I4891 Unspecified atrial fibrillation: Secondary | ICD-10-CM | POA: Diagnosis present

## 2013-08-25 DIAGNOSIS — L570 Actinic keratosis: Secondary | ICD-10-CM

## 2013-08-25 DIAGNOSIS — K5720 Diverticulitis of large intestine with perforation and abscess without bleeding: Secondary | ICD-10-CM

## 2013-08-25 DIAGNOSIS — K449 Diaphragmatic hernia without obstruction or gangrene: Secondary | ICD-10-CM | POA: Diagnosis present

## 2013-08-25 DIAGNOSIS — Z5181 Encounter for therapeutic drug level monitoring: Secondary | ICD-10-CM

## 2013-08-25 DIAGNOSIS — S81009A Unspecified open wound, unspecified knee, initial encounter: Secondary | ICD-10-CM | POA: Diagnosis present

## 2013-08-25 DIAGNOSIS — I872 Venous insufficiency (chronic) (peripheral): Secondary | ICD-10-CM

## 2013-08-25 DIAGNOSIS — Z4682 Encounter for fitting and adjustment of non-vascular catheter: Secondary | ICD-10-CM | POA: Diagnosis not present

## 2013-08-25 DIAGNOSIS — R6520 Severe sepsis without septic shock: Secondary | ICD-10-CM

## 2013-08-25 DIAGNOSIS — Z96649 Presence of unspecified artificial hip joint: Secondary | ICD-10-CM

## 2013-08-25 DIAGNOSIS — L270 Generalized skin eruption due to drugs and medicaments taken internally: Secondary | ICD-10-CM | POA: Diagnosis not present

## 2013-08-25 DIAGNOSIS — G4700 Insomnia, unspecified: Secondary | ICD-10-CM

## 2013-08-25 DIAGNOSIS — R202 Paresthesia of skin: Secondary | ICD-10-CM

## 2013-08-25 DIAGNOSIS — N390 Urinary tract infection, site not specified: Secondary | ICD-10-CM | POA: Diagnosis not present

## 2013-08-25 DIAGNOSIS — Q762 Congenital spondylolisthesis: Secondary | ICD-10-CM

## 2013-08-25 DIAGNOSIS — K2990 Gastroduodenitis, unspecified, without bleeding: Secondary | ICD-10-CM | POA: Diagnosis not present

## 2013-08-25 DIAGNOSIS — S82209A Unspecified fracture of shaft of unspecified tibia, initial encounter for closed fracture: Secondary | ICD-10-CM | POA: Diagnosis not present

## 2013-08-25 DIAGNOSIS — I4892 Unspecified atrial flutter: Secondary | ICD-10-CM

## 2013-08-25 DIAGNOSIS — M899 Disorder of bone, unspecified: Secondary | ICD-10-CM

## 2013-08-25 DIAGNOSIS — R1084 Generalized abdominal pain: Secondary | ICD-10-CM | POA: Diagnosis not present

## 2013-08-25 DIAGNOSIS — S82202D Unspecified fracture of shaft of left tibia, subsequent encounter for closed fracture with routine healing: Secondary | ICD-10-CM

## 2013-08-25 DIAGNOSIS — I10 Essential (primary) hypertension: Secondary | ICD-10-CM

## 2013-08-25 DIAGNOSIS — E876 Hypokalemia: Secondary | ICD-10-CM | POA: Diagnosis not present

## 2013-08-25 DIAGNOSIS — K5732 Diverticulitis of large intestine without perforation or abscess without bleeding: Secondary | ICD-10-CM

## 2013-08-25 DIAGNOSIS — S91009A Unspecified open wound, unspecified ankle, initial encounter: Secondary | ICD-10-CM

## 2013-08-25 DIAGNOSIS — S82209B Unspecified fracture of shaft of unspecified tibia, initial encounter for open fracture type I or II: Secondary | ICD-10-CM | POA: Diagnosis not present

## 2013-08-25 DIAGNOSIS — D7589 Other specified diseases of blood and blood-forming organs: Secondary | ICD-10-CM | POA: Diagnosis not present

## 2013-08-25 DIAGNOSIS — D509 Iron deficiency anemia, unspecified: Secondary | ICD-10-CM

## 2013-08-25 DIAGNOSIS — N731 Chronic parametritis and pelvic cellulitis: Secondary | ICD-10-CM | POA: Diagnosis present

## 2013-08-25 DIAGNOSIS — M4800 Spinal stenosis, site unspecified: Secondary | ICD-10-CM

## 2013-08-25 DIAGNOSIS — F411 Generalized anxiety disorder: Secondary | ICD-10-CM | POA: Diagnosis present

## 2013-08-25 DIAGNOSIS — Z9181 History of falling: Secondary | ICD-10-CM

## 2013-08-25 DIAGNOSIS — D638 Anemia in other chronic diseases classified elsewhere: Secondary | ICD-10-CM | POA: Diagnosis present

## 2013-08-25 DIAGNOSIS — K630 Abscess of intestine: Secondary | ICD-10-CM | POA: Diagnosis present

## 2013-08-25 DIAGNOSIS — I509 Heart failure, unspecified: Secondary | ICD-10-CM | POA: Diagnosis present

## 2013-08-25 DIAGNOSIS — A419 Sepsis, unspecified organism: Secondary | ICD-10-CM | POA: Diagnosis present

## 2013-08-25 DIAGNOSIS — K269 Duodenal ulcer, unspecified as acute or chronic, without hemorrhage or perforation: Secondary | ICD-10-CM | POA: Diagnosis not present

## 2013-08-25 DIAGNOSIS — K5900 Constipation, unspecified: Secondary | ICD-10-CM

## 2013-08-25 DIAGNOSIS — Z66 Do not resuscitate: Secondary | ICD-10-CM | POA: Diagnosis present

## 2013-08-25 DIAGNOSIS — K219 Gastro-esophageal reflux disease without esophagitis: Secondary | ICD-10-CM | POA: Diagnosis present

## 2013-08-25 DIAGNOSIS — J9819 Other pulmonary collapse: Secondary | ICD-10-CM | POA: Diagnosis not present

## 2013-08-25 DIAGNOSIS — R52 Pain, unspecified: Secondary | ICD-10-CM | POA: Diagnosis not present

## 2013-08-25 DIAGNOSIS — K5730 Diverticulosis of large intestine without perforation or abscess without bleeding: Secondary | ICD-10-CM | POA: Diagnosis not present

## 2013-08-25 DIAGNOSIS — R10819 Abdominal tenderness, unspecified site: Secondary | ICD-10-CM | POA: Diagnosis not present

## 2013-08-25 DIAGNOSIS — M949 Disorder of cartilage, unspecified: Secondary | ICD-10-CM

## 2013-08-25 DIAGNOSIS — E669 Obesity, unspecified: Secondary | ICD-10-CM | POA: Diagnosis present

## 2013-08-25 DIAGNOSIS — I5032 Chronic diastolic (congestive) heart failure: Secondary | ICD-10-CM | POA: Diagnosis present

## 2013-08-25 DIAGNOSIS — Z6835 Body mass index (BMI) 35.0-35.9, adult: Secondary | ICD-10-CM | POA: Diagnosis not present

## 2013-08-25 DIAGNOSIS — K2970 Gastritis, unspecified, without bleeding: Secondary | ICD-10-CM | POA: Diagnosis not present

## 2013-08-25 DIAGNOSIS — K651 Peritoneal abscess: Secondary | ICD-10-CM

## 2013-08-25 DIAGNOSIS — E785 Hyperlipidemia, unspecified: Secondary | ICD-10-CM | POA: Diagnosis present

## 2013-08-25 DIAGNOSIS — R079 Chest pain, unspecified: Secondary | ICD-10-CM | POA: Diagnosis not present

## 2013-08-25 DIAGNOSIS — K5289 Other specified noninfective gastroenteritis and colitis: Secondary | ICD-10-CM | POA: Diagnosis not present

## 2013-08-25 DIAGNOSIS — Z5189 Encounter for other specified aftercare: Secondary | ICD-10-CM | POA: Diagnosis not present

## 2013-08-25 DIAGNOSIS — I4510 Unspecified right bundle-branch block: Secondary | ICD-10-CM | POA: Diagnosis present

## 2013-08-25 DIAGNOSIS — S82202E Unspecified fracture of shaft of left tibia, subsequent encounter for open fracture type I or II with routine healing: Secondary | ICD-10-CM

## 2013-08-25 DIAGNOSIS — Z7901 Long term (current) use of anticoagulants: Secondary | ICD-10-CM

## 2013-08-25 DIAGNOSIS — R112 Nausea with vomiting, unspecified: Secondary | ICD-10-CM | POA: Diagnosis not present

## 2013-08-25 DIAGNOSIS — E039 Hypothyroidism, unspecified: Secondary | ICD-10-CM | POA: Diagnosis not present

## 2013-08-25 DIAGNOSIS — K279 Peptic ulcer, site unspecified, unspecified as acute or chronic, without hemorrhage or perforation: Secondary | ICD-10-CM

## 2013-08-25 DIAGNOSIS — N259 Disorder resulting from impaired renal tubular function, unspecified: Secondary | ICD-10-CM

## 2013-08-25 DIAGNOSIS — R262 Difficulty in walking, not elsewhere classified: Secondary | ICD-10-CM | POA: Diagnosis not present

## 2013-08-25 HISTORY — DX: Anxiety disorder, unspecified: F41.9

## 2013-08-25 LAB — BASIC METABOLIC PANEL
Anion gap: 14 (ref 5–15)
BUN: 11 mg/dL (ref 6–23)
CO2: 26 mEq/L (ref 19–32)
Calcium: 8.1 mg/dL — ABNORMAL LOW (ref 8.4–10.5)
Chloride: 98 mEq/L (ref 96–112)
Creatinine, Ser: 0.81 mg/dL (ref 0.50–1.10)
GFR calc Af Amer: 75 mL/min — ABNORMAL LOW (ref >90)
GFR calc non Af Amer: 64 mL/min — ABNORMAL LOW (ref >90)
Glucose, Bld: 111 mg/dL — ABNORMAL HIGH (ref 70–99)
Potassium: 3.2 mEq/L — ABNORMAL LOW (ref 3.7–5.3)
Sodium: 138 mEq/L (ref 137–147)

## 2013-08-25 LAB — CBC
HCT: 44.3 % (ref 36.0–46.0)
HCT: 38.7 % (ref 36.0–46.0)
Hemoglobin: 14.8 g/dL (ref 12.0–15.0)
Hemoglobin: 12.7 g/dL (ref 12.0–15.0)
MCH: 35.2 pg — ABNORMAL HIGH (ref 26.0–34.0)
MCH: 35.9 pg — ABNORMAL HIGH (ref 26.0–34.0)
MCHC: 33.4 g/dL (ref 30.0–36.0)
MCHC: 32.8 g/dL (ref 30.0–36.0)
MCV: 105.5 fL — ABNORMAL HIGH (ref 78.0–100.0)
MCV: 109.3 fL — ABNORMAL HIGH (ref 78.0–100.0)
Platelets: 268 10*3/uL (ref 150–400)
Platelets: 279 10*3/uL (ref 150–400)
RBC: 4.2 MIL/uL (ref 3.87–5.11)
RBC: 3.54 MIL/uL — ABNORMAL LOW (ref 3.87–5.11)
RDW: 12.9 % (ref 11.5–15.5)
RDW: 12.9 % (ref 11.5–15.5)
WBC: 21.9 10*3/uL — ABNORMAL HIGH (ref 4.0–10.5)
WBC: 24.9 10*3/uL — ABNORMAL HIGH (ref 4.0–10.5)

## 2013-08-25 LAB — COMPREHENSIVE METABOLIC PANEL
ALT: 31 U/L (ref 0–35)
AST: 24 U/L (ref 0–37)
Albumin: 3.2 g/dL — ABNORMAL LOW (ref 3.5–5.2)
Alkaline Phosphatase: 90 U/L (ref 39–117)
Anion gap: 16 — ABNORMAL HIGH (ref 5–15)
BUN: 13 mg/dL (ref 6–23)
CO2: 29 mEq/L (ref 19–32)
Calcium: 8.8 mg/dL (ref 8.4–10.5)
Chloride: 95 mEq/L — ABNORMAL LOW (ref 96–112)
Creatinine, Ser: 0.82 mg/dL (ref 0.50–1.10)
GFR calc Af Amer: 74 mL/min — ABNORMAL LOW (ref >90)
GFR calc non Af Amer: 63 mL/min — ABNORMAL LOW (ref >90)
Glucose, Bld: 123 mg/dL — ABNORMAL HIGH (ref 70–99)
Potassium: 3.6 mEq/L — ABNORMAL LOW (ref 3.7–5.3)
Sodium: 140 mEq/L (ref 137–147)
Total Bilirubin: 1.2 mg/dL (ref 0.3–1.2)
Total Protein: 7.7 g/dL (ref 6.0–8.3)

## 2013-08-25 LAB — TISSUE CULTURE: Gram Stain: NONE SEEN

## 2013-08-25 LAB — URINALYSIS, ROUTINE W REFLEX MICROSCOPIC
Bilirubin Urine: NEGATIVE
Glucose, UA: NEGATIVE mg/dL
Ketones, ur: NEGATIVE mg/dL
Nitrite: NEGATIVE
Protein, ur: 30 mg/dL — AB
Specific Gravity, Urine: 1.019 (ref 1.005–1.030)
Urobilinogen, UA: 0.2 mg/dL (ref 0.0–1.0)
pH: 7 (ref 5.0–8.0)

## 2013-08-25 LAB — URINE MICROSCOPIC-ADD ON

## 2013-08-25 LAB — I-STAT CG4 LACTIC ACID, ED: Lactic Acid, Venous: 3.76 mmol/L — ABNORMAL HIGH (ref 0.5–2.2)

## 2013-08-25 LAB — PROTIME-INR
INR: 1.49 (ref 0.00–1.49)
Prothrombin Time: 18 seconds — ABNORMAL HIGH (ref 11.6–15.2)

## 2013-08-25 LAB — I-STAT TROPONIN, ED: Troponin i, poc: 0 ng/mL (ref 0.00–0.08)

## 2013-08-25 LAB — MRSA PCR SCREENING: MRSA by PCR: NEGATIVE

## 2013-08-25 LAB — LIPASE, BLOOD: Lipase: 13 U/L (ref 11–59)

## 2013-08-25 MED ORDER — DILTIAZEM LOAD VIA INFUSION
20.0000 mg | Freq: Once | INTRAVENOUS | Status: AC
  Administered 2013-08-25: 20 mg via INTRAVENOUS
  Filled 2013-08-25: qty 20

## 2013-08-25 MED ORDER — SODIUM CHLORIDE 0.9 % IV BOLUS (SEPSIS)
500.0000 mL | Freq: Once | INTRAVENOUS | Status: AC
  Administered 2013-08-25: 500 mL via INTRAVENOUS

## 2013-08-25 MED ORDER — CIPROFLOXACIN IN D5W 400 MG/200ML IV SOLN
400.0000 mg | Freq: Two times a day (BID) | INTRAVENOUS | Status: DC
  Administered 2013-08-26: 400 mg via INTRAVENOUS
  Filled 2013-08-25 (×2): qty 200

## 2013-08-25 MED ORDER — DILTIAZEM HCL 100 MG IV SOLR: 5.0000 mg/h | INTRAVENOUS | Status: DC

## 2013-08-25 MED ORDER — METRONIDAZOLE IN NACL 5-0.79 MG/ML-% IV SOLN
500.0000 mg | Freq: Three times a day (TID) | INTRAVENOUS | Status: DC
  Administered 2013-08-25 – 2013-08-26 (×2): 500 mg via INTRAVENOUS
  Filled 2013-08-25 (×3): qty 100

## 2013-08-25 MED ORDER — WARFARIN - PHARMACIST DOSING INPATIENT: Freq: Every day | Status: DC

## 2013-08-25 MED ORDER — FENTANYL CITRATE 0.05 MG/ML IJ SOLN
25.0000 ug | Freq: Once | INTRAMUSCULAR | Status: AC
  Administered 2013-08-25: 25 ug via INTRAVENOUS
  Filled 2013-08-25: qty 2

## 2013-08-25 MED ORDER — IOHEXOL 300 MG/ML  SOLN
80.0000 mL | Freq: Once | INTRAMUSCULAR | Status: AC | PRN
  Administered 2013-08-25: 80 mL via INTRAVENOUS

## 2013-08-25 MED ORDER — POTASSIUM CHLORIDE 10 MEQ/100ML IV SOLN
10.0000 meq | INTRAVENOUS | Status: AC
  Administered 2013-08-25 (×3): 10 meq via INTRAVENOUS
  Filled 2013-08-25: qty 100

## 2013-08-25 MED ORDER — WARFARIN SODIUM 5 MG PO TABS
5.0000 mg | ORAL_TABLET | Freq: Once | ORAL | Status: DC
  Filled 2013-08-25: qty 1

## 2013-08-25 MED ORDER — IOHEXOL 300 MG/ML  SOLN
25.0000 mL | Freq: Once | INTRAMUSCULAR | Status: AC | PRN
  Administered 2013-08-25: 25 mL via ORAL

## 2013-08-25 MED ORDER — FENTANYL CITRATE 0.05 MG/ML IJ SOLN
50.0000 ug | Freq: Once | INTRAMUSCULAR | Status: AC
  Administered 2013-08-25: 50 ug via INTRAVENOUS
  Filled 2013-08-25: qty 2

## 2013-08-25 MED ORDER — HEPARIN (PORCINE) IN NACL 100-0.45 UNIT/ML-% IJ SOLN
1200.0000 [IU]/h | INTRAMUSCULAR | Status: DC
  Administered 2013-08-25: 1000 [IU]/h via INTRAVENOUS
  Filled 2013-08-25: qty 250

## 2013-08-25 MED ORDER — ONDANSETRON HCL 4 MG PO TABS
4.0000 mg | ORAL_TABLET | Freq: Four times a day (QID) | ORAL | Status: DC | PRN
  Administered 2013-08-26: 4 mg via ORAL
  Filled 2013-08-25: qty 1

## 2013-08-25 MED ORDER — ONDANSETRON HCL 4 MG/2ML IJ SOLN
4.0000 mg | Freq: Four times a day (QID) | INTRAMUSCULAR | Status: DC | PRN
  Administered 2013-08-25 – 2013-08-29 (×4): 4 mg via INTRAVENOUS
  Filled 2013-08-25 (×4): qty 2

## 2013-08-25 MED ORDER — MORPHINE SULFATE 2 MG/ML IJ SOLN
1.0000 mg | INTRAMUSCULAR | Status: DC | PRN
  Administered 2013-08-25 – 2013-08-26 (×3): 1 mg via INTRAVENOUS
  Filled 2013-08-25 (×3): qty 1

## 2013-08-25 MED ORDER — SODIUM CHLORIDE 0.9 % IV SOLN
INTRAVENOUS | Status: DC
  Administered 2013-08-25 – 2013-08-27 (×3): via INTRAVENOUS

## 2013-08-25 MED ORDER — METRONIDAZOLE IN NACL 5-0.79 MG/ML-% IV SOLN
500.0000 mg | Freq: Once | INTRAVENOUS | Status: AC
  Administered 2013-08-25: 500 mg via INTRAVENOUS
  Filled 2013-08-25: qty 100

## 2013-08-25 MED ORDER — LEVOTHYROXINE SODIUM 100 MCG IV SOLR
12.5000 ug | Freq: Every day | INTRAVENOUS | Status: DC
  Administered 2013-08-26: 12.5 ug via INTRAVENOUS
  Filled 2013-08-25: qty 5

## 2013-08-25 MED ORDER — DEXTROSE 5 % IV SOLN
5.0000 mg/h | INTRAVENOUS | Status: AC
  Administered 2013-08-25: 15 mg/h via INTRAVENOUS
  Administered 2013-08-25: 5 mg/h via INTRAVENOUS
  Administered 2013-08-25 – 2013-08-28 (×10): 15 mg/h via INTRAVENOUS

## 2013-08-25 MED ORDER — ONDANSETRON HCL 4 MG/2ML IJ SOLN
4.0000 mg | Freq: Once | INTRAMUSCULAR | Status: AC
  Administered 2013-08-25: 4 mg via INTRAVENOUS
  Filled 2013-08-25: qty 2

## 2013-08-25 MED ORDER — CIPROFLOXACIN IN D5W 400 MG/200ML IV SOLN
400.0000 mg | Freq: Once | INTRAVENOUS | Status: AC
  Administered 2013-08-25: 400 mg via INTRAVENOUS
  Filled 2013-08-25: qty 200

## 2013-08-25 NOTE — H&P (Signed)
History and Physical  [name] SNK:[id number] DOB: [birth year] DOA: 08/25/2013  Referring physician: Evelina Bucy, ER physician PCP: Walker Kehr, MD   Chief Complaint: Abdominal pain  HPI: RUE [name] is a 80 y.o. female  Past medical history of atrial fibrillation on chronic anticoagulation and recent fall last month ago which led to an open fracture of the lower extremity requiring surgical intervention post wound VAC. Patient was in skilled nursing for rehabilitation and started having left lower quadrant abdominal pain the last few days to the point where she could not take it anymore and came into the emergency room.  CT scan of the abdomen was noted for inflammatory changes in the pelvis and right lower quadrant with wall thickening of small bowel adjacent loops concerning for infectious ileitis the larger extraluminal fluid collection and findings consistent with a possible ruptured diverticulitis and peridiverticular abscess. General surgery was called for consultation. Hospitalists' evaluation. Lab work also noted to have a white blood cell count 21, anion gap of 16 with lactic acid level of 3.76. Patient started on IV Cipro and Flagyl. Blood cultures were drawn, but after patient received dose of initial antibiotics.   Review of Systems:  Patient seen in emergency room. Pt complains of mild headache, abdominal pain, mostly in the lower quadrants. She's not had a bowel movement now for a number of days. He feels overall 1 out and weak. Also complains of left leg focal pain ongoing because of her fall, fracture and wound repair  Pt denies any vision changes, dysphasia, chest pain or palpitations, shortness of breath, wheezing or cough, hematuria, dysuria, diarrhea.  Review of systems are otherwise negative  Past Medical History  Diagnosis Date  . Hypertension   . LBP (low back pain)     severe lumbar spondylosis s/p L3/L4,L4/L5 fusion 12/10  . Osteopenia   . B12 deficiency      boderline  . Iron deficiency anemia 2011    elevated MCV  . OA (osteoarthritis)   . Hypothyroidism   . Chronic constipation   . History of colitis     DX  2003  WITH ISCHEMIC COLITIS  . History of GI bleed     2011--- UPPER SECONARY GASTRIC ULCER FROM NSAID USE  . History of Helicobacter pylori infection     2011  . GERD (gastroesophageal reflux disease)   . H/O hiatal hernia   . Diverticulosis of colon   . History of ulcer of lower limb     2012   RIGHT LOWER EXTREMITIY--  STATSIS ULCER  . Bilateral edema of lower extremity   . RBBB   . Persistent atrial fibrillation     CARDIOLOGIST-   DR Burman Foster  . Lumbar spondylolysis   . Anticoagulated on Coumadin   . Hyperlipidemia   . Open wound of left lower leg    Past Surgical History  Procedure Laterality Date  . Rotator cuff repair Left 1997  . Total hip arthroplasty Right 06-07-2010  . External fixation leg Left 06/16/2013    Procedure: EXTERNAL FIXATION LEG;  Surgeon: Meredith Pel, MD;  Location: [place];  Service: Orthopedics;  Laterality: Left;  . I&d extremity Left 06/16/2013    Procedure: IRRIGATION AND DEBRIDEMENT EXTREMITY;  Surgeon: Meredith Pel, MD;  Location: [place];  Service: Orthopedics;  Laterality: Left;  . Application of wound vac Left 06/16/2013    Procedure: APPLICATION OF WOUND VAC;  Surgeon: Meredith Pel, MD;  Location: [place];  Service:  Orthopedics;  Laterality: Left;  . Tibia im nail insertion Left 06/21/2013    Procedure: INTRAMEDULLARY (IM) NAIL TIBIAL;  Surgeon: Meredith Pel, MD;  Location: [place];  Service: Orthopedics;  Laterality: Left;  . External fixation removal Left 06/21/2013    Procedure: REMOVAL EXTERNAL FIXATION LEG;  Surgeon: Meredith Pel, MD;  Location: [place];  Service: Orthopedics;  Laterality: Left;  . Application of a-cell of extremity Left 06/21/2013    Procedure: APPLICATION OF A-CELL OF EXTREMITY;  Surgeon: Theodoro Kos, DO;  Location: [place];  Service: Plastics;   Laterality: Left;  . Application of wound vac Left 06/21/2013    Procedure: APPLICATION OF WOUND VAC;  Surgeon: Theodoro Kos, DO;  Location: [place];  Service: Plastics;  Laterality: Left;  . Lumbar fusion  01-02-2009    L3 -- L5  . Transthoracic echocardiogram  09-11-2010   DR MCALHANY    LVSF  55-60%/  MILD MV CALCIFICATION WITH NO STENOSIS/  MILD MR & TR / MODERATE LAE/  MODERATE TO SEVERE RAE  . Tonsillectomy  AS CHILD  . Application of a-cell of extremity Left 08/22/2013    Procedure: APPLICATION OF A-CELL/VAC TO LOWER LEFT LEG WOUND;  Surgeon: Theodoro Kos, DO;  Location: [place];  Service: Plastics;  Laterality: Left;   Social History:  reports that she has never smoked. She does not have any smokeless tobacco history on file. She reports that she drinks about 4.2 ounces of alcohol per week. She reports that she does not use illicit drugs. Patient lives at home with her husband & is able to participate in activities of daily living with out assistance until her fall. Now she is in a wheelchair working to progress to a walker and currently she is nonweightbearing on her left foot  Allergies  Allergen Reactions  . Atenolol Nausea And Vomiting  . Clarithromycin Nausea And Vomiting  . Codeine Sulfate Nausea Only  . Macrodantin Other (See Comments)    Long time ago - reaction unknown  . Percocet [Oxycodone-Acetaminophen] Nausea And Vomiting    Family History  Problem Relation Age of Onset  . Cancer Father   . Hypertension Other     As discussed with patient's daughter  Prior to Admission medications   Medication Sig Start Date End Date Taking? Authorizing Provider  acetaminophen (TYLENOL) 500 MG tablet Take 500 mg by mouth daily as needed for pain.   Yes Historical Provider, MD  Amino Acids-Protein Hydrolys (FEEDING SUPPLEMENT, PRO-STAT SUGAR FREE 64,) LIQD Take 30 mLs by mouth daily.   Yes Historical Provider, MD  atorvastatin (LIPITOR) 10 MG tablet Take 10 mg by  mouth every evening.   Yes Historical Provider, MD  B Complex-C (B-COMPLEX WITH VITAMIN C) tablet Take 1 tablet by mouth daily.   Yes Historical Provider, MD  bisacodyl (DULCOLAX) 10 MG suppository Place 10 mg rectally once.   Yes Historical Provider, MD  busPIRone (BUSPAR) 7.5 MG tablet Take 7.5 mg by mouth 2 (two) times daily.   Yes Historical Provider, MD  Cholecalciferol (VITAMIN D3) 1000 UNITS tablet Take 1,000 Units by mouth daily.     Yes Historical Provider, MD  diltiazem (DILACOR XR) 120 MG 24 hr capsule Take 120 mg by mouth daily.   Yes Historical Provider, MD  Ferrous Sulfate (IRON) 325 (65 FE) MG TABS Take 1 tablet by mouth daily.   Yes Historical Provider, MD  furosemide (LASIX) 40 MG tablet Take 40 mg by mouth 2 (two) times  daily.   Yes Historical Provider, MD  levothyroxine (SYNTHROID, LEVOTHROID) 25 MCG tablet Take 25 mcg by mouth daily before breakfast.   Yes Historical Provider, MD  LORazepam (ATIVAN) 0.5 MG tablet Take 0.5 mg by mouth every 8 (eight) hours as needed for anxiety.   Yes Historical Provider, MD  metoprolol succinate (TOPROL-XL) 50 MG 24 hr tablet Take 50 mg by mouth 2 (two) times daily. Take with or immediately following a meal.   Yes Historical Provider, MD  mineral oil enema Place 1 enema rectally daily as needed for mild constipation or severe constipation.   Yes Historical Provider, MD  pantoprazole (PROTONIX) 40 MG tablet Take 1 tablet (40 mg total) by mouth daily before breakfast. 01/19/13  Yes Milus Banister, MD  polyethylene glycol [facility] / GLYCOLAX) packet Take 17 g by mouth daily.   Yes Historical Provider, MD  potassium chloride SA (K-DUR,KLOR-CON) 20 MEQ tablet Take 1 tablet (20 mEq total) by mouth daily. 12/22/12  Yes Aleksei [name] V, MD  promethazine (PHENERGAN) 12.5 MG tablet Take 12.5 mg by mouth every 6 (six) hours as needed for nausea or vomiting.   Yes Historical Provider, MD  senna-docusate (SENOKOT-S) 8.6-50 MG per tablet Take 1 tablet by  mouth 2 (two) times daily.   Yes Historical Provider, MD  temazepam (RESTORIL) 30 MG capsule Take 30 mg by mouth at bedtime.   Yes Historical Provider, MD  traMADol (ULTRAM) 50 MG tablet Take 50 mg by mouth every 6 (six) hours as needed for moderate pain.   Yes Historical Provider, MD  vitamin C (ASCORBIC ACID) 500 MG tablet Take 500 mg by mouth daily. Take with iron dose   Yes Historical Provider, MD  warfarin (COUMADIN) 2.5 MG tablet Take 2.5-5 mg by mouth daily. Take 2.5mg  Monday, Tuesday, Wednesday, Thursday, Friday and Saturday.  Take 5mg  on Sunday.   Yes Historical Provider, MD    Physical Exam: BP 135/69  Pulse 106  Temp(Src) 98.5 F (36.9 C) (Oral)  Resp 23  SpO2 96%  General:  Alert and oriented x3, fatigue, mild distress secondary to pain Eyes: Sclera nonicteric, extraocular movements are intact ENT: Normocephalic, atraumatic, mucous members are slightly dry Neck: Supple, no JVD Cardiovascular: Irregular rhythm, mild tachycardia Respiratory: Clear to auscultation bilaterally Abdomen: Mild rigidity, scant bowel sounds, some guarding and tenderness in the left lower and mid lower quadrants Skin: Skin breaks, tears or lesions Musculoskeletal: Left lower extremity in Ace wrap plus boot with drain in place Psychiatric: Patient is appropriate, no evidence of psychoses Neurologic: No focal deficits           Labs on Admission:  Basic Metabolic Panel:  Recent Labs Lab 08/25/13 1008  NA 140  K 3.6*  CL 95*  CO2 29  GLUCOSE 123*  BUN 13  CREATININE 0.82  CALCIUM 8.8   Liver Function Tests:  Recent Labs Lab 08/25/13 1008  AST 24  ALT 31  ALKPHOS 90  BILITOT 1.2  PROT 7.7  ALBUMIN 3.2*    Recent Labs Lab 08/25/13 1008  LIPASE 13   No results found for this basename: AMMONIA,  in the last 168 hours CBC:  Recent Labs Lab 08/25/13 1008  WBC 21.9*  HGB 14.8  HCT 44.3  MCV 105.5*  PLT 268   Cardiac Enzymes: No results found for this basename:  CKTOTAL, CKMB, CKMBINDEX, TROPONINI,  in the last 168 hours  BNP (last 3 results) No results found for this basename: PROBNP,  in the last  8760 hours CBG: No results found for this basename: GLUCAP,  in the last 168 hours  Radiological Exams on Admission: Ct Abdomen Pelvis W Contrast  08/25/2013   CLINICAL DATA:  Right-sided abdominal pain.  EXAM: CT ABDOMEN AND PELVIS WITH CONTRAST  TECHNIQUE: Multidetector CT imaging of the abdomen and pelvis was performed using the standard protocol following bolus administration of intravenous contrast.  CONTRAST:  45mL OMNIPAQUE IOHEXOL 300 MG/ML  SOLN  COMPARISON:  None.  FINDINGS: There are 2 adjacent loops of thick-walled small bowel in the right anterior pelvis, 1 having a maximum wall thickness of 6 mm and the other 5 mm. There is inflammatory type stranding adjacent to these loops. In addition, a fluid collection containing small bubbles of air tracks along the posterior inferior margin of the more anterior loop. Date tubular fluid collection tracks along the inferior margin of the more inferior and posterior loop.  There is a larger extraluminal fluid collection in the inferior pelvis posterior to the lower uterine segment and to the left of the rectosigmoid. It measures 5.2 cm x 3 cm x 2.2 cm in size. It contain small bubbles of air. This is contiguous with the margin of the rectosigmoid, which may be the origin of this extraluminal collection. There are multiple diverticula noted along the sigmoid colon. This collection may be from a ruptured diverticulum.  There is a small round fluid collection in the right lower quadrant along the posterior peritoneal margin measuring 2 cm in greatest dimension. No air is seen within this collection.  No appendix is seen. However, the findings appear remote from the cecal tip that are most likely not the consequence of acute appendicitis.  There is subsegmental atelectasis at the lung bases. Small moderate hiatal hernia.  Heart is mildly enlarged.  Liver, spleen and gallbladder are unremarkable. No bile duct dilation.  There is dilation of the pancreatic duct, maximum of 8 mm in the pancreatic head. This is likely chronic. No pancreatic mass or inflammatory change.  Normal adrenal glands. Kidneys show mild cortical thinning. There is small bilateral low-density renal lesions that are likely cysts. No hydronephrosis. Ureters are unremarkable. No bladder mass or stone.  Uterus and adnexa are unremarkable other than the adjacent inflammatory changes described above. This  No generalized ascites.  No nondependent free intraperitoneal air.  There are degenerative changes throughout the visualized spine. Bones are demineralized. The right hip prosthesis is well-seated and aligned. There is a mild compression/ burst fracture of L4 that is old. Intervertebral cages have been placed at L3-L4-L4-L5.  IMPRESSION: 1. There are inflammatory changes in the pelvis and right lower quadrant. 2. Two adjacent loops of small bowel in the anterior right pelvis show significant wall thickening, which may be the source of the inflammatory changes-- an infectious or inflammatory ileitis. The wall thickening could potentially be reactive. There are small adjacent extraluminal fluid collections containing small bubbles of air. A larger extraluminal fluid collection lies along the left margin of the rectosigmoid junction. Findings could be due to ruptured diverticulitis and peridiverticular abscess, with secondary reactive edema/inflammation of the loops of small bowel. 3. No appendix is visualized. Ruptured appendix is unlikely but not excluded-- the findings are not centered on the expected location of the appendix. 4. No other acute findings.   Electronically Signed   By: Lajean Manes M.D.   On: 08/25/2013 13:19   Dg Chest Portable 1 View  08/25/2013   CLINICAL DATA:  Chest pain, abdominal pain  EXAM:  PORTABLE CHEST - 1 VIEW  COMPARISON:  06/16/2013   FINDINGS: Cardiomegaly is noted. Mild elevation of the right hemidiaphragm. No acute infiltrate or pulmonary edema. Stable atelectasis or scarring in right midlung.  IMPRESSION: Cardiomegaly. Chronic elevation of the right hemidiaphragm. No active disease. Stable atelectasis or scarring right midlung.   Electronically Signed   By: Lahoma Crocker M.D.   On: 08/25/2013 09:54    EKG: Independently reviewed. Read as Atrial fibrillation with right bundle branch block, although looks more a flutter  Assessment/Plan Present on Admission:  . Unspecified constipation . Atrial flutter: Cardizem drip  . HYPERTENSION: Holding antihypertensives secondary to sepsis and abdominal issues  . HYPOTHYROIDISM: IV Synthroid  . Iron deficiency anemia, unspecified: Monitor closely  . Sepsis: Principal problem. Sepsis protocol. IV fluids and antibiotics. General surgery following as well. IV PICC line for continued IV antibiotics and access  . Metabolic acidosis: Secondary lactic acidosis, from sepsis. See above  . Diverticulitis of colon with perforation: Suspect as underlying cause of abdominal infection  Chronic diastolic heart failure: Noted on previous echo. As patient improves, will need to be careful about volume overload  Consultants: General surgery Have also notified plastic surgery about patient's admission  Code Status: DO NOT RESUSCITATE as confirmed by patient  Family Communication: Daughter at the bedside   Disposition Plan: Transferred to step down. Sepsis protocol. IV fluids and antibiotics. Repeat blood work and hopefully improvement  Time spent: 45 minutes  [facility] [place] [facility] Pager [phone]  **Disclaimer: This note may have been dictated with voice recognition software. Similar sounding words can inadvertently be transcribed and this note may contain transcription errors which may not have been corrected upon publication of note.**

## 2013-08-25 NOTE — ED Notes (Signed)
Phlebotomy at bedside.

## 2013-08-25 NOTE — Progress Notes (Signed)
Iv team  Called claiming that picc line be inserted tom. Since they have no picc line nurse  tonight.

## 2013-08-25 NOTE — Consult Note (Signed)
I saw the patient, participated in the history, exam and medical decision making, and concur with the physician assistant's note above.  Pt seen & examined around 6pm Alert, ox4 Frail, sickly appearance Soft, RLQ TTP, suprapubic TTP; no peritonitis/rt  Imaging/labs/history reviewed  Several RLQ small bowel loops thickened with some trace fluid Complex pelvic abscess prob secondary to diverticulitis with mircoperf No gross free air; unclear etiology of RLQ small bowel process - perhaps reactive from pelvic diveriticulitis.   Discussed with husband, daughter and grand-daughter for 20 minutes rec starting with IV abx, bowel rest this evening. See how she does overnight. Will more than likely rec perc drain into pelvic abscess. Explained that pelvic abscess seems complex (more like stool and not simple pus - but it is contained). If perc drain does reveal stool, not sure if diverticulitis will resolve without surgery; however, as i explained to family need to take one thing/day at a time. Also discussed possibility that perc drain may not be successful at relieving abscess - could be to thick. Explained to family medical treatment recommendations could change daily depending on her response   [name] Ruff. Redmond Pulling, MD, FACS General, Bariatric, & Minimally Invasive Surgery [facility], [place]

## 2013-08-25 NOTE — Consult Note (Signed)
[name] 28-Nov-1925  [id number].   Requesting MD: Dr. Evelina Bucy Chief Complaint/Reason for Consult: diverticulitis with abscess HPI:  This is an 80 year old white female who has multiple medical problems and is very frail appearing. She recently via a ground-level fall had an open tib-fib fracture of her left lower extremity. This was repaired. She was discharged to a nursing facility and has been there for the last 2 months. She came back on Monday and had placement of A-cell by Dr. Migdalia Dk for her open wound.  She has been having some constipation. Therefore she has been getting multiple suppositories and enemas at the facility. On Tuesday she began having some nausea and vomiting. She began having abdominal pain on Tuesday. Discontinued yesterday. She states this is located in the right lower cautery. She has not had a colonoscopy for many years. She does not recall having any diverticulosis on her colonoscopy. Because of continued abdominal pain, the patient was brought to [facility] emergency department where she underwent a CT scan as well as labs. Her white blood cell count was found to be 22,000. Her CT scan revealed 2 loops of small bowel and the anterior right pelvis that show significant wall thickening which may be an infectious or inflammatory ileitis. After further review with one of the radiologists, he fills this area is separate from the infection in her pelvis. She is noted to have a large or extraluminal fluid collection that lies along the left margin of the rectosigmoid junction. This is possibly due to ruptured diverticulitis with peridiverticular abscess. We have been asked to evaluate the patient for further recommendations.  ROS please see history of present illness, otherwise all other systems are negative except for pain in her left lower extremity and is a touch down weight bearing status.  She does complain of weakness.  Family History  Problem Relation Age of Onset  .  Cancer Father   . Hypertension Other     Past Medical History  Diagnosis Date  . Hypertension   . LBP (low back pain)     severe lumbar spondylosis s/p L3/L4,L4/L5 fusion 12/10  . Osteopenia   . B12 deficiency     boderline  . Iron deficiency anemia 2011    elevated MCV  . OA (osteoarthritis)   . Hypothyroidism   . Chronic constipation   . History of colitis     DX  2003  WITH ISCHEMIC COLITIS  . History of GI bleed     2011--- UPPER SECONARY GASTRIC ULCER FROM NSAID USE  . History of Helicobacter pylori infection     2011  . GERD (gastroesophageal reflux disease)   . H/O hiatal hernia   . Diverticulosis of colon   . History of ulcer of lower limb     2012   RIGHT LOWER EXTREMITIY--  STATSIS ULCER  . Bilateral edema of lower extremity   . RBBB   . Persistent atrial fibrillation     CARDIOLOGIST-   DR Burman Foster  . Lumbar spondylolysis   . Anticoagulated on Coumadin   . Hyperlipidemia   . Open wound of left lower leg     Past Surgical History  Procedure Laterality Date  . Rotator cuff repair Left 1997  . Total hip arthroplasty Right 06-07-2010  . External fixation leg Left 06/16/2013    Procedure: EXTERNAL FIXATION LEG;  Surgeon: Meredith Pel, MD;  Location: [place];  Service: Orthopedics;  Laterality: Left;  . I&d extremity Left 06/16/2013  Procedure: IRRIGATION AND DEBRIDEMENT EXTREMITY;  Surgeon: Meredith Pel, MD;  Location: [place];  Service: Orthopedics;  Laterality: Left;  . Application of wound vac Left 06/16/2013    Procedure: APPLICATION OF WOUND VAC;  Surgeon: Meredith Pel, MD;  Location: [place];  Service: Orthopedics;  Laterality: Left;  . Tibia im nail insertion Left 06/21/2013    Procedure: INTRAMEDULLARY (IM) NAIL TIBIAL;  Surgeon: Meredith Pel, MD;  Location: [place];  Service: Orthopedics;  Laterality: Left;  . External fixation removal Left 06/21/2013    Procedure: REMOVAL EXTERNAL FIXATION LEG;  Surgeon: Meredith Pel, MD;  Location:  [place];  Service: Orthopedics;  Laterality: Left;  . Application of a-cell of extremity Left 06/21/2013    Procedure: APPLICATION OF A-CELL OF EXTREMITY;  Surgeon: Theodoro Kos, DO;  Location: [place];  Service: Plastics;  Laterality: Left;  . Application of wound vac Left 06/21/2013    Procedure: APPLICATION OF WOUND VAC;  Surgeon: Theodoro Kos, DO;  Location: [place];  Service: Plastics;  Laterality: Left;  . Lumbar fusion  01-02-2009    L3 -- L5  . Transthoracic echocardiogram  09-11-2010   DR MCALHANY    LVSF  55-60%/  MILD MV CALCIFICATION WITH NO STENOSIS/  MILD MR & TR / MODERATE LAE/  MODERATE TO SEVERE RAE  . Tonsillectomy  AS CHILD  . Application of a-cell of extremity Left 08/22/2013    Procedure: APPLICATION OF A-CELL/VAC TO LOWER LEFT LEG WOUND;  Surgeon: Theodoro Kos, DO;  Location: [place];  Service: Plastics;  Laterality: Left;    Social History:  reports that she has never smoked. She does not have any smokeless tobacco history on file. She reports that she drinks about 4.2 ounces of alcohol per week. She reports that she does not use illicit drugs.  Allergies:  Allergies  Allergen Reactions  . Atenolol Nausea And Vomiting  . Clarithromycin Nausea And Vomiting  . Codeine Sulfate Nausea Only  . Macrodantin Other (See Comments)    Long time ago - reaction unknown  . Percocet [Oxycodone-Acetaminophen] Nausea And Vomiting     (Not in a hospital admission)  Blood pressure 135/69, pulse 106, temperature 98.5 F (36.9 C), temperature source Oral, resp. rate 23, SpO2 96.00%. Physical Exam: General: Frail-appearing white female who is laying in bed in NAD HEENT: head is normocephalic, atraumatic.  Sclera are noninjected.  PERRL.  Ears and nose without any masses or lesions.  Mouth is pink and moist Heart: Tachycardic, irregularly irregular.  Normal s1,s2. No obvious murmurs, gallops, or rubs noted.  Palpable radial and pedal pulses bilaterally Lungs: CTAB, no  wheezes, rhonchi, or rales noted.  Respiratory effort nonlabored Abd: soft, tender in the right lower quadrant, suprapubic and mildly tender in the left lower quadrant. No peritonitis but mild voluntary guarding focally in the right lower quadrant, ND, +BS, no masses, hernias, or organomegaly MS: all 4 extremities are symmetrical except she has a wrap with a VAC and a boot placed on her left lower extremity Skin: warm and dry with no masses, lesions, or rashes Psych: A&Ox3 with an appropriate affect.    Results for orders placed during the hospital encounter of 08/25/13 (from the past 48 hour(s))  CBC     Status: Abnormal   Collection Time    08/25/13 10:08 AM      Result Value Ref Range   WBC 21.9 (*) 4.0 - 10.5 K/uL   RBC 4.20  3.87 -  5.11 MIL/uL   Hemoglobin 14.8  12.0 - 15.0 g/dL   HCT 44.3  36.0 - 46.0 %   MCV 105.5 (*) 78.0 - 100.0 fL   MCH 35.2 (*) 26.0 - 34.0 pg   MCHC 33.4  30.0 - 36.0 g/dL   RDW 12.9  11.5 - 15.5 %   Platelets 268  150 - 400 K/uL  COMPREHENSIVE METABOLIC PANEL     Status: Abnormal   Collection Time    08/25/13 10:08 AM      Result Value Ref Range   Sodium 140  137 - 147 mEq/L   Potassium 3.6 (*) 3.7 - 5.3 mEq/L   Chloride 95 (*) 96 - 112 mEq/L   CO2 29  19 - 32 mEq/L   Glucose, Bld 123 (*) 70 - 99 mg/dL   BUN 13  6 - 23 mg/dL   Creatinine, Ser 0.82  0.50 - 1.10 mg/dL   Calcium 8.8  8.4 - 10.5 mg/dL   Total Protein 7.7  6.0 - 8.3 g/dL   Albumin 3.2 (*) 3.5 - 5.2 g/dL   AST 24  0 - 37 U/L   ALT 31  0 - 35 U/L   Alkaline Phosphatase 90  39 - 117 U/L   Total Bilirubin 1.2  0.3 - 1.2 mg/dL   GFR calc non Af Amer 63 (*) >90 mL/min   GFR calc Af Amer 74 (*) >90 mL/min   Comment: (NOTE)     The eGFR has been calculated using the CKD EPI equation.     This calculation has not been validated in all clinical situations.     eGFR's persistently <90 mL/min signify possible Chronic Kidney     Disease.   Anion gap 16 (*) 5 - 15  LIPASE, BLOOD     Status:  None   Collection Time    08/25/13 10:08 AM      Result Value Ref Range   Lipase 13  11 - 59 U/L  PROTIME-INR     Status: Abnormal   Collection Time    08/25/13 10:08 AM      Result Value Ref Range   Prothrombin Time 18.0 (*) 11.6 - 15.2 seconds   INR 1.49  0.00 - 1.49  I-STAT TROPOININ, ED     Status: None   Collection Time    08/25/13 10:15 AM      Result Value Ref Range   Troponin i, poc 0.00  0.00 - 0.08 ng/mL   Comment 3            Comment: Due to the release kinetics of cTnI,     a negative result within the first hours     of the onset of symptoms does not rule out     myocardial infarction with certainty.     If myocardial infarction is still suspected,     repeat the test at appropriate intervals.  URINALYSIS, ROUTINE W REFLEX MICROSCOPIC     Status: Abnormal   Collection Time    08/25/13 11:06 AM      Result Value Ref Range   Color, Urine YELLOW  YELLOW   APPearance HAZY (*) CLEAR   Specific Gravity, Urine 1.019  1.005 - 1.030   pH 7.0  5.0 - 8.0   Glucose, UA NEGATIVE  NEGATIVE mg/dL   Hgb urine dipstick SMALL (*) NEGATIVE   Bilirubin Urine NEGATIVE  NEGATIVE   Ketones, ur NEGATIVE  NEGATIVE mg/dL   Protein, ur 30 (*)  NEGATIVE mg/dL   Urobilinogen, UA 0.2  0.0 - 1.0 mg/dL   Nitrite NEGATIVE  NEGATIVE   Leukocytes, UA SMALL (*) NEGATIVE  URINE MICROSCOPIC-ADD ON     Status: Abnormal   Collection Time    08/25/13 11:06 AM      Result Value Ref Range   Squamous Epithelial / LPF FEW (*) RARE   WBC, UA 3-6  <3 WBC/hpf   RBC / HPF 0-2  <3 RBC/hpf   Bacteria, UA MANY (*) RARE  I-STAT CG4 LACTIC ACID, ED     Status: Abnormal   Collection Time    08/25/13 11:25 AM      Result Value Ref Range   Lactic Acid, Venous 3.76 (*) 0.5 - 2.2 mmol/L   Ct Abdomen Pelvis W Contrast  08/25/2013   CLINICAL DATA:  Right-sided abdominal pain.  EXAM: CT ABDOMEN AND PELVIS WITH CONTRAST  TECHNIQUE: Multidetector CT imaging of the abdomen and pelvis was performed using the standard  protocol following bolus administration of intravenous contrast.  CONTRAST:  23m OMNIPAQUE IOHEXOL 300 MG/ML  SOLN  COMPARISON:  None.  FINDINGS: There are 2 adjacent loops of thick-walled small bowel in the right anterior pelvis, 1 having a maximum wall thickness of 6 mm and the other 5 mm. There is inflammatory type stranding adjacent to these loops. In addition, a fluid collection containing small bubbles of air tracks along the posterior inferior margin of the more anterior loop. Date tubular fluid collection tracks along the inferior margin of the more inferior and posterior loop.  There is a larger extraluminal fluid collection in the inferior pelvis posterior to the lower uterine segment and to the left of the rectosigmoid. It measures 5.2 cm x 3 cm x 2.2 cm in size. It contain small bubbles of air. This is contiguous with the margin of the rectosigmoid, which may be the origin of this extraluminal collection. There are multiple diverticula noted along the sigmoid colon. This collection may be from a ruptured diverticulum.  There is a small round fluid collection in the right lower quadrant along the posterior peritoneal margin measuring 2 cm in greatest dimension. No air is seen within this collection.  No appendix is seen. However, the findings appear remote from the cecal tip that are most likely not the consequence of acute appendicitis.  There is subsegmental atelectasis at the lung bases. Small moderate hiatal hernia. Heart is mildly enlarged.  Liver, spleen and gallbladder are unremarkable. No bile duct dilation.  There is dilation of the pancreatic duct, maximum of 8 mm in the pancreatic head. This is likely chronic. No pancreatic mass or inflammatory change.  Normal adrenal glands. Kidneys show mild cortical thinning. There is small bilateral low-density renal lesions that are likely cysts. No hydronephrosis. Ureters are unremarkable. No bladder mass or stone.  Uterus and adnexa are unremarkable  other than the adjacent inflammatory changes described above. This  No generalized ascites.  No nondependent free intraperitoneal air.  There are degenerative changes throughout the visualized spine. Bones are demineralized. The right hip prosthesis is well-seated and aligned. There is a mild compression/ burst fracture of L4 that is old. Intervertebral cages have been placed at L3-L4-L4-L5.  IMPRESSION: 1. There are inflammatory changes in the pelvis and right lower quadrant. 2. Two adjacent loops of small bowel in the anterior right pelvis show significant wall thickening, which may be the source of the inflammatory changes-- an infectious or inflammatory ileitis. The wall thickening could potentially be reactive.  There are small adjacent extraluminal fluid collections containing small bubbles of air. A larger extraluminal fluid collection lies along the left margin of the rectosigmoid junction. Findings could be due to ruptured diverticulitis and peridiverticular abscess, with secondary reactive edema/inflammation of the loops of small bowel. 3. No appendix is visualized. Ruptured appendix is unlikely but not excluded-- the findings are not centered on the expected location of the appendix. 4. No other acute findings.   Electronically Signed   By: Lajean Manes M.D.   On: 08/25/2013 13:19   Dg Chest Portable 1 View  08/25/2013   CLINICAL DATA:  Chest pain, abdominal pain  EXAM: PORTABLE CHEST - 1 VIEW  COMPARISON:  06/16/2013  FINDINGS: Cardiomegaly is noted. Mild elevation of the right hemidiaphragm. No acute infiltrate or pulmonary edema. Stable atelectasis or scarring in right midlung.  IMPRESSION: Cardiomegaly. Chronic elevation of the right hemidiaphragm. No active disease. Stable atelectasis or scarring right midlung.   Electronically Signed   By: Lahoma Crocker M.D.   On: 08/25/2013 09:54       Assessment/Plan 1. Abdominal pain, right lower quadrant inflammatory process and possible diverticulitis with  abscess 2. Leukocytosis Patient Active Problem List   Diagnosis Date Noted  . Sepsis 08/25/2013  . Metabolic acidosis 22/97/9892  . Diverticulitis of colon with perforation 08/25/2013  . Unspecified constipation 06/22/2013  . Tibial fracture 06/21/2013  . Open tibial fracture 06/17/2013  . Encounter for therapeutic drug monitoring 02/16/2013  . Well adult exam 07/21/2012  . Paresthesia 01/28/2012  . Chronic venous insufficiency 11/11/2010  . Actinic keratoses 05/30/2010  . Long term current use of anticoagulant 03/12/2010  . PEPTIC ULCER DISEASE 11/22/2009  . Iron deficiency anemia, unspecified 10/31/2009  . HIP PAIN 10/31/2009  . HYPOTHYROIDISM 10/25/2009  . RENAL INSUFFICIENCY 03/19/2009  . INSOMNIA, PERSISTENT 11/07/2008  . SPINAL STENOSIS 09/04/2008  . Atrial flutter 05/31/2008  . HYPERTENSION 11/30/2006  . OSTEOARTHRITIS 11/30/2006  . LOW BACK PAIN 11/30/2006  . OSTEOPENIA 11/30/2006   Plan: 1. Agree with medical admission. She should remain n.p.o. for now. Continued antibiotic therapy. It appears that she has 2 separate processes going on in her abdomen. It is possible that the right lower quadrant the small bowel is reactive however there is a good fat plane between this and is felt unlikely.   2. After review with interventional radiology, this fluid collection in the pelvis is potentially drainable. However Dr. Redmond Pulling will review her images and evaluate the patient and make recommendations on how to proceed. We would like to try to avoid an operation if at all possible as I don't know how well she would do with this given her frail appearance currently. We will follow along with you.  Tashanti Dalporto E 08/25/2013, 3:12 PM Pager: [phone]

## 2013-08-25 NOTE — ED Notes (Signed)
Per ems: patient c/o right sided abdominal, tried enema and laxitave yesterday no relief, pain started yeste4rday at 0400, has been impacted before, had runny post enema bm yesterday

## 2013-08-25 NOTE — ED Notes (Signed)
Ct informed that patient has finished oral contrast

## 2013-08-25 NOTE — Telephone Encounter (Signed)
[facility] Triage Call Report Triage Record Num: [id number] Operator: Vaughan Sine Patient Name: [name] Call Date & Time: 08/25/2013 7:45:19AM Patient Phone: [phone] PCP: Walker Kehr Patient Gender: Female PCP Fax : [phone] Patient DOB: 1925/09/04 Practice Name: [facility] Reason for Call: Caller: Pam/Daughter; PCP: Walker Kehr (Adults only); CB#: [phone]; Call regarding Fever, Decreased Appetite. Temp 101.0 Oral on 8-6. Pt is at [facility]. Post op on 8-3, Muscle placed on exposed Bone after hx of Frax in May. Ortho, Dr Migdalia Dk was contacted, MD suggested taking Pt to ED d/t new onset of Fever, Post op and Facility unable to draw blood for CBC. Pt is drinking sips and urinating. Dr Migdalia Dk ordered CT d/t new onset Abdominal pain and intermittent vomiting; MD advised Daughter to f/u w/ Dr Alain [name] Daughter calling to inform Dr Alain [place] of ED dispo, Daughter will f/u w/ Office w/ prognosis after ED visit. Protocol(s) Used: PCP Calls, No Triage (Adult) Recommended Outcome per Protocol: Call Provider within 24 Hours Reason for Outcome: [1] Follow-up call from parent regarding patient's clinical status AND [2] information not urgent Care Advice: ~ 08/

## 2013-08-25 NOTE — Progress Notes (Addendum)
MEDICATION RELATED CONSULT NOTE - INITIAL   Pharmacy Consult for cipro + warfarin Indication: diverticulitis w/ abscess, afib  Allergies  Allergen Reactions  . Atenolol Nausea And Vomiting  . Clarithromycin Nausea And Vomiting  . Codeine Sulfate Nausea Only  . Macrodantin Other (See Comments)    Long time ago - reaction unknown  . Percocet [Oxycodone-Acetaminophen] Nausea And Vomiting    Patient Measurements:    Vital Signs: Temp: 98.5 F (36.9 C) (08/06 1336) Temp src: Oral (08/06 1336) BP: 135/69 mmHg (08/06 1500) Pulse Rate: 106 (08/06 1500) Intake/Output from previous day:   Intake/Output from this shift: Total I/O In: 2000 [I.V.:2000] Out: -   Labs:  Recent Labs  08/25/13 1008  WBC 21.9*  HGB 14.8  HCT 44.3  PLT 268  CREATININE 0.82  ALBUMIN 3.2*  PROT 7.7  AST 24  ALT 31  ALKPHOS 90  BILITOT 1.2   The CrCl is unknown because both a height and weight (above a minimum accepted value) are required for this calculation.   Microbiology: Recent Results (from the past 720 hour(s))  ANAEROBIC CULTURE     Status: None   Collection Time    08/22/13  1:41 PM      Result Value Ref Range Status   Specimen Description LEG LEFT BONE CHIP   Final   Special Requests     Final   Value: PATIENT ON FOLLOWING CIPRO 400 MG PRIOR TO CASE STARTING   Gram Stain     Final   Value: NO WBC SEEN     NO SQUAMOUS EPITHELIAL CELLS SEEN     NO ORGANISMS SEEN     Performed at [facility]   Culture     Final   Value: NO ANAEROBES ISOLATED; CULTURE IN PROGRESS FOR 5 DAYS     Performed at [facility]   Report Status PENDING   Incomplete  TISSUE CULTURE     Status: None   Collection Time    08/22/13  1:41 PM      Result Value Ref Range Status   Specimen Description LEG LEFT BONE CHIP   Final   Special Requests     Final   Value: PATIENT ON FOLLOWING CIPRO 400 MG PRIOR TO CASE STARTING   Gram Stain     Final   Value: NO WBC SEEN     NO SQUAMOUS  EPITHELIAL CELLS SEEN     NO ORGANISMS SEEN     Performed at [facility]   Culture     Final   Value: FEW STAPHYLOCOCCUS AUREUS     Note: RIFAMPIN AND GENTAMICIN SHOULD NOT BE USED AS SINGLE DRUGS FOR TREATMENT OF STAPH INFECTIONS.     Performed at [facility]   Report Status 08/25/2013 FINAL   Final   Organism ID, Bacteria STAPHYLOCOCCUS AUREUS   Final    Medical History: Past Medical History  Diagnosis Date  . Hypertension   . LBP (low back pain)     severe lumbar spondylosis s/p L3/L4,L4/L5 fusion 12/10  . Osteopenia   . B12 deficiency     boderline  . Iron deficiency anemia 2011    elevated MCV  . OA (osteoarthritis)   . Hypothyroidism   . Chronic constipation   . History of colitis     DX  2003  WITH ISCHEMIC COLITIS  . History of GI bleed     2011--- UPPER SECONARY GASTRIC ULCER FROM NSAID USE  . History  of Helicobacter pylori infection     2011  . GERD (gastroesophageal reflux disease)   . H/O hiatal hernia   . Diverticulosis of colon   . History of ulcer of lower limb     2012   RIGHT LOWER EXTREMITIY--  STATSIS ULCER  . Bilateral edema of lower extremity   . RBBB   . Persistent atrial fibrillation     CARDIOLOGIST-   DR Burman Foster  . Lumbar spondylolysis   . Anticoagulated on Coumadin   . Hyperlipidemia   . Open wound of left lower leg     Medications:  Anti-infectives   Start     Dose/Rate Route Frequency Ordered Stop   08/26/13 0200  ciprofloxacin (CIPRO) IVPB 400 mg     400 mg 200 mL/hr over 60 Minutes Intravenous Every 12 hours 08/25/13 1646     08/25/13 1645  metroNIDAZOLE (FLAGYL) IVPB 500 mg     500 mg 100 mL/hr over 60 Minutes Intravenous Every 8 hours 08/25/13 1636     08/25/13 1345  ciprofloxacin (CIPRO) IVPB 400 mg     400 mg 200 mL/hr over 60 Minutes Intravenous  Once 08/25/13 1344 08/25/13 1515   08/25/13 1345  metroNIDAZOLE (FLAGYL) IVPB 500 mg     500 mg 100 mL/hr over 60 Minutes Intravenous  Once 08/25/13 1344  08/25/13 1554      Assessment: 62 yof presented to the ED with abdominal pain. She is on chronic coumadin for afib however her INR is low at 1.49. H/H and plts are WNL and no bleeding noted. Also to start on empiric cipro and flagyl. Pt is afebrile but WBC is elevated. Pt has adequate renal fxn. Of note, both flagyl and cipro can increase the INR of patients on coumadin.   Goal of Therapy:  INR 2-3 Eradication of infection  Plan:  1. Warfarin 5mg  PO x 1 tonight 2. Daily INR 3. Cipro 400mg  IV Q12H 4. F/u renal fxn, C&S, clinical status  Yashas Camilli, Rande Lawman 08/25/2013,4:46 PM  Addendum: Now planning to hold coumadin d/t NPO status. Will start heparin in the meantime.  Plan: 1. Heparin gtt 1000 units/hr 2. Check an 8 hour heparin level 3. Daily heparin level and CBC  Salome Arnt, PharmD, BCPS Pager # [phone] 08/25/2013 5:05 PM

## 2013-08-25 NOTE — ED Notes (Signed)
Pt O2 sat decreased to 89%. Placed on 1.5 liters/min [place].

## 2013-08-25 NOTE — ED Provider Notes (Addendum)
CSN: [id number]     Arrival date & time 08/25/13  0908 History   First MD Initiated Contact with Patient 08/25/13 [phone]     Chief Complaint  Patient presents with  . Abdominal Pain     (Consider location/radiation/quality/duration/timing/severity/associated sxs/prior Treatment) Patient is a 80 y.o. female presenting with abdominal pain. The history is provided by the patient.  Abdominal Pain Pain location:  RLQ Pain quality: aching and sharp   Pain radiates to:  Does not radiate Pain severity:  Moderate Onset quality:  Gradual Timing:  Constant Progression:  Worsening Chronicity:  New Context: not previous surgeries and not recent illness   Context comment:  Constipated Relieved by:  Nothing Worsened by:  Nothing tried Associated symptoms: constipation   Associated symptoms: no chills, no fever, no nausea and no shortness of breath     Past Medical History  Diagnosis Date  . Hypertension   . LBP (low back pain)     severe lumbar spondylosis s/p L3/L4,L4/L5 fusion 12/10  . Osteopenia   . B12 deficiency     boderline  . Iron deficiency anemia 2011    elevated MCV  . OA (osteoarthritis)   . Hypothyroidism   . Chronic constipation   . History of colitis     DX  2003  WITH ISCHEMIC COLITIS  . History of GI bleed     2011--- UPPER SECONARY GASTRIC ULCER FROM NSAID USE  . History of Helicobacter pylori infection     2011  . GERD (gastroesophageal reflux disease)   . H/O hiatal hernia   . Diverticulosis of colon   . History of ulcer of lower limb     2012   RIGHT LOWER EXTREMITIY--  STATSIS ULCER  . Bilateral edema of lower extremity   . RBBB   . Persistent atrial fibrillation     CARDIOLOGIST-   DR Burman Foster  . Lumbar spondylolysis   . Anticoagulated on Coumadin   . Hyperlipidemia   . Open wound of left lower leg    Past Surgical History  Procedure Laterality Date  . Rotator cuff repair Left 1997  . Total hip arthroplasty Right 06-07-2010  . External fixation leg  Left 06/16/2013    Procedure: EXTERNAL FIXATION LEG;  Surgeon: Meredith Pel, MD;  Location: [place];  Service: Orthopedics;  Laterality: Left;  . I&d extremity Left 06/16/2013    Procedure: IRRIGATION AND DEBRIDEMENT EXTREMITY;  Surgeon: Meredith Pel, MD;  Location: [place];  Service: Orthopedics;  Laterality: Left;  . Application of wound vac Left 06/16/2013    Procedure: APPLICATION OF WOUND VAC;  Surgeon: Meredith Pel, MD;  Location: [place];  Service: Orthopedics;  Laterality: Left;  . Tibia im nail insertion Left 06/21/2013    Procedure: INTRAMEDULLARY (IM) NAIL TIBIAL;  Surgeon: Meredith Pel, MD;  Location: [place];  Service: Orthopedics;  Laterality: Left;  . External fixation removal Left 06/21/2013    Procedure: REMOVAL EXTERNAL FIXATION LEG;  Surgeon: Meredith Pel, MD;  Location: [place];  Service: Orthopedics;  Laterality: Left;  . Application of a-cell of extremity Left 06/21/2013    Procedure: APPLICATION OF A-CELL OF EXTREMITY;  Surgeon: Theodoro Kos, DO;  Location: [place];  Service: Plastics;  Laterality: Left;  . Application of wound vac Left 06/21/2013    Procedure: APPLICATION OF WOUND VAC;  Surgeon: Theodoro Kos, DO;  Location: [place];  Service: Plastics;  Laterality: Left;  . Lumbar fusion  01-02-2009    L3 --  L5  . Transthoracic echocardiogram  09-11-2010   DR MCALHANY    LVSF  55-60%/  MILD MV CALCIFICATION WITH NO STENOSIS/  MILD MR & TR / MODERATE LAE/  MODERATE TO SEVERE RAE  . Tonsillectomy  AS CHILD  . Application of a-cell of extremity Left 08/22/2013    Procedure: APPLICATION OF A-CELL/VAC TO LOWER LEFT LEG WOUND;  Surgeon: Theodoro Kos, DO;  Location: [place];  Service: Plastics;  Laterality: Left;   Family History  Problem Relation Age of Onset  . Cancer Father   . Hypertension Other    History  Substance Use Topics  . Smoking status: Never Smoker   . Smokeless tobacco: Not on file  . Alcohol Use: 4.2 oz/week    7 Glasses of  wine per week     Comment: 1 glass daily   OB History   Grav Para Term Preterm Abortions TAB SAB Ect Mult Living                 Review of Systems  Constitutional: Negative for fever and chills.  Respiratory: Negative for shortness of breath.   Gastrointestinal: Positive for abdominal pain and constipation. Negative for nausea.  All other systems reviewed and are negative.     Allergies  Atenolol; Clarithromycin; Codeine sulfate; Macrodantin; and Percocet  Home Medications   Prior to Admission medications   Medication Sig Start Date End Date Taking? Authorizing Provider  acetaminophen (TYLENOL) 500 MG tablet Take 500 mg by mouth daily as needed for pain.    Historical Provider, MD  atorvastatin (LIPITOR) 10 MG tablet Take 10 mg by mouth every evening.    Historical Provider, MD  B Complex-C (B-COMPLEX WITH VITAMIN C) tablet Take 1 tablet by mouth daily.    Historical Provider, MD  Cholecalciferol (VITAMIN D3) 1000 UNITS tablet Take 1,000 Units by mouth daily.      Historical Provider, MD  clobetasol cream (TEMOVATE) 7.90 % Apply 1 application topically 2 (two) times daily as needed (itching). As directed 03/31/11   Historical Provider, MD  diltiazem (CARDIZEM CD) 120 MG 24 hr capsule TAKE 1 CAPSULE (120 MG TOTAL) BY MOUTH DAILY.    Burnell Blanks, MD  Ferrous Sulfate (IRON) 325 (65 FE) MG TABS Take 1 tablet by mouth daily.    Historical Provider, MD  furosemide (LASIX) 40 MG tablet Take 40 mg by mouth 2 (two) times daily.    Historical Provider, MD  HYDROcodone-acetaminophen (NORCO) 10-325 MG per tablet Take 1 tablet by mouth every 4 (four) hours as needed for moderate pain. 06/24/13   Meredith Pel, MD  levothyroxine (SYNTHROID, LEVOTHROID) 25 MCG tablet Take 25 mcg by mouth daily before breakfast.    Historical Provider, MD  metoprolol succinate (TOPROL-XL) 50 MG 24 hr tablet TAKE 1 TABLET (50 MG TOTAL) BY MOUTH 2 (TWO) TIMES DAILY. TAKE WITH OR IMMEDIATELY FOLLOWING A  MEAL.    Burnell Blanks, MD  pantoprazole (PROTONIX) 40 MG tablet Take 1 tablet (40 mg total) by mouth daily before breakfast. 01/19/13   Milus Banister, MD  potassium chloride SA (K-DUR,KLOR-CON) 20 MEQ tablet Take 1 tablet (20 mEq total) by mouth daily. 12/22/12   Aleksei [name] V, MD  temazepam (RESTORIL) 30 MG capsule TAKE ONE CAPSULE AT BEDTIME AS NEEDED    Aleksei [name] V, MD  vitamin C (ASCORBIC ACID) 500 MG tablet Take 500 mg by mouth daily. Take with iron dose    Historical Provider, MD  warfarin (COUMADIN) 5 MG tablet TAKE 1 TABLET (5 MG TOTAL) BY MOUTH AS DIRECTED.    Burnell Blanks, MD   BP 135/78  Pulse 109  Temp(Src) 98 F (36.7 C) (Oral)  Resp 23  SpO2 94% Physical Exam  Nursing note and vitals reviewed. Constitutional: She is oriented to person, place, and time. She appears well-developed and well-nourished. No distress.  HENT:  Head: Normocephalic and atraumatic.  Mouth/Throat: Oropharynx is clear and moist.  Eyes: EOM are normal. Pupils are equal, round, and reactive to light.  Neck: Normal range of motion. Neck supple.  Cardiovascular: Normal rate and regular rhythm.  Exam reveals no friction rub.   No murmur heard. Pulmonary/Chest: Effort normal and breath sounds normal. No respiratory distress. She has no wheezes. She has no rales.  Abdominal: Soft. She exhibits distension (mild, lower). There is tenderness (RLQ). There is no rebound.  Genitourinary:  No stool in vault.  Musculoskeletal: Normal range of motion. She exhibits no edema.  Neurological: She is alert and oriented to person, place, and time.  Skin: She is not diaphoretic.    ED Course  Procedures (including critical care time) Labs Review Labs Reviewed  CBC  COMPREHENSIVE METABOLIC PANEL  LIPASE, BLOOD  URINALYSIS, ROUTINE W REFLEX MICROSCOPIC  PROTIME-INR  I-STAT TROPOININ, ED    Imaging Review No results found.   EKG Interpretation   Date/Time:  Thursday August 25 2013 09:20:00 EDT Ventricular Rate:  126 PR Interval:    QRS Duration: 132 QT Interval:  353 QTC Calculation: 511 R Axis:   61 Text Interpretation:  Atrial fibrillation Right bundle branch block  Similar to prior Confirmed by [facility]  MD, [place] ([id number]) on 08/25/2013 9:31:18  AM     CRITICAL CARE Performed by: Osvaldo Shipper   Total critical care time: 30 minutes  Critical care time was exclusive of separately billable procedures and treating other patients.  Critical care was necessary to treat or prevent imminent or life-threatening deterioration.  Critical care was time spent personally by me on the following activities: development of treatment plan with patient and/or surrogate as well as nursing, discussions with consultants, evaluation of patient's response to treatment, examination of patient, obtaining history from patient or surrogate, ordering and performing treatments and interventions, ordering and review of laboratory studies, ordering and review of radiographic studies, pulse oximetry and re-evaluation of patient's condition.  MDM   Final diagnoses:  Intra-abdominal infection  Generalized abdominal pain  Non-intractable vomiting with nausea, vomiting of unspecified type  Atrial fibrillation with RVR    80 year old female here with right-sided abdominal pain. History of constipation while in her rehabilitation facility after an open tib/fib fx. had very small bowel movement but no full relief after laxatives and enema. She started to have nausea with 2 episodes of vomiting. No history of bowel obstruction. Has been unable to take her meds. Here her vitals show an irregularly irregular tachycardia consistent with A. fib. EKG looks similar to prior with right bundle branch block. Patient has right lower quadrant pain. She is not impacted, there is no stool the rectal vault. We'll start her on diltiazem drip for her A. fib, will check labs and scan her belly. Dilt  gtt at 15 mg/hr with mild improvement in HR - 110s-120s. Labs show leukocytosis. CT shows fluid collection, concerning for perforated diverticulitis, possible ruptured appendicitis. Antibiotics initiated. Surgery consulted, will follow. Admitted to [facility] [place] by Dr. [place] Pink.  Evelina Bucy, MD 08/25/13 1448  Evelina Bucy, MD  08/25/13 1540 

## 2013-08-25 NOTE — Progress Notes (Signed)
Admitting nurse  To come and do the admission history.

## 2013-08-25 NOTE — ED Notes (Signed)
General surgery at bedside. 

## 2013-08-26 ENCOUNTER — Inpatient Hospital Stay (HOSPITAL_COMMUNITY): Payer: Medicare Other

## 2013-08-26 ENCOUNTER — Encounter (HOSPITAL_COMMUNITY): Payer: Self-pay | Admitting: Radiology

## 2013-08-26 LAB — CBC
HCT: 33.4 % — ABNORMAL LOW (ref 36.0–46.0)
Hemoglobin: 11.1 g/dL — ABNORMAL LOW (ref 12.0–15.0)
MCH: 35.2 pg — ABNORMAL HIGH (ref 26.0–34.0)
MCHC: 33.2 g/dL (ref 30.0–36.0)
MCV: 106 fL — ABNORMAL HIGH (ref 78.0–100.0)
Platelets: 232 10*3/uL (ref 150–400)
RBC: 3.15 MIL/uL — ABNORMAL LOW (ref 3.87–5.11)
RDW: 13.1 % (ref 11.5–15.5)
WBC: 24.9 10*3/uL — ABNORMAL HIGH (ref 4.0–10.5)

## 2013-08-26 LAB — COMPREHENSIVE METABOLIC PANEL
ALT: 21 U/L (ref 0–35)
AST: 23 U/L (ref 0–37)
Albumin: 2.5 g/dL — ABNORMAL LOW (ref 3.5–5.2)
Alkaline Phosphatase: 87 U/L (ref 39–117)
Anion gap: 14 (ref 5–15)
BUN: 10 mg/dL (ref 6–23)
CO2: 24 mEq/L (ref 19–32)
Calcium: 7.9 mg/dL — ABNORMAL LOW (ref 8.4–10.5)
Chloride: 98 mEq/L (ref 96–112)
Creatinine, Ser: 0.78 mg/dL (ref 0.50–1.10)
GFR calc Af Amer: 86 mL/min — ABNORMAL LOW (ref >90)
GFR calc non Af Amer: 74 mL/min — ABNORMAL LOW (ref >90)
Glucose, Bld: 121 mg/dL — ABNORMAL HIGH (ref 70–99)
Potassium: 4 mEq/L (ref 3.7–5.3)
Sodium: 136 mEq/L — ABNORMAL LOW (ref 137–147)
Total Bilirubin: 1.2 mg/dL (ref 0.3–1.2)
Total Protein: 6.4 g/dL (ref 6.0–8.3)

## 2013-08-26 LAB — PROTIME-INR
INR: 1.6 — ABNORMAL HIGH (ref 0.00–1.49)
Prothrombin Time: 19.1 seconds — ABNORMAL HIGH (ref 11.6–15.2)

## 2013-08-26 LAB — HEPARIN LEVEL (UNFRACTIONATED): Heparin Unfractionated: <0.1 IU/mL — ABNORMAL LOW (ref 0.30–0.70)

## 2013-08-26 LAB — SURGICAL PCR SCREEN
MRSA, PCR: NEGATIVE
Staphylococcus aureus: NEGATIVE

## 2013-08-26 MED ORDER — SODIUM CHLORIDE 0.9 % IJ SOLN
10.0000 mL | INTRAMUSCULAR | Status: DC | PRN
  Administered 2013-09-07: 10 mL

## 2013-08-26 MED ORDER — FENTANYL CITRATE 0.05 MG/ML IJ SOLN
INTRAMUSCULAR | Status: AC
  Filled 2013-08-26: qty 4

## 2013-08-26 MED ORDER — SODIUM CHLORIDE 0.9 % IV SOLN
1.0000 g | INTRAVENOUS | Status: DC
  Administered 2013-08-26 – 2013-08-28 (×3): 1 g via INTRAVENOUS
  Filled 2013-08-26 (×3): qty 1

## 2013-08-26 MED ORDER — MIDAZOLAM HCL 2 MG/2ML IJ SOLN
INTRAMUSCULAR | Status: AC | PRN
  Administered 2013-08-26 (×3): 0.5 mg via INTRAVENOUS

## 2013-08-26 MED ORDER — METOPROLOL SUCCINATE ER 50 MG PO TB24
50.0000 mg | ORAL_TABLET | Freq: Two times a day (BID) | ORAL | Status: DC
  Administered 2013-08-26 – 2013-08-28 (×5): 50 mg via ORAL
  Filled 2013-08-26 (×7): qty 1

## 2013-08-26 MED ORDER — MIDAZOLAM HCL 2 MG/2ML IJ SOLN
INTRAMUSCULAR | Status: AC
  Filled 2013-08-26: qty 4

## 2013-08-26 MED ORDER — MORPHINE SULFATE 2 MG/ML IJ SOLN
2.0000 mg | INTRAMUSCULAR | Status: DC | PRN
  Administered 2013-08-26 (×3): 2 mg via INTRAVENOUS
  Administered 2013-08-27 (×2): 4 mg via INTRAVENOUS
  Administered 2013-08-27 (×2): 2 mg via INTRAVENOUS
  Administered 2013-08-27: 4 mg via INTRAVENOUS
  Administered 2013-08-27 – 2013-08-28 (×4): 2 mg via INTRAVENOUS
  Filled 2013-08-26 (×2): qty 1
  Filled 2013-08-26: qty 2
  Filled 2013-08-26 (×2): qty 1
  Filled 2013-08-26: qty 2
  Filled 2013-08-26 (×2): qty 1
  Filled 2013-08-26: qty 2
  Filled 2013-08-26 (×4): qty 1

## 2013-08-26 MED ORDER — LEVOTHYROXINE SODIUM 25 MCG PO TABS
25.0000 ug | ORAL_TABLET | Freq: Every day | ORAL | Status: DC
  Administered 2013-08-27 – 2013-08-29 (×3): 25 ug via ORAL
  Filled 2013-08-26 (×4): qty 1

## 2013-08-26 MED ORDER — FENTANYL CITRATE 0.05 MG/ML IJ SOLN
INTRAMUSCULAR | Status: AC | PRN
  Administered 2013-08-26 (×2): 12.5 ug via INTRAVENOUS
  Administered 2013-08-26: 25 ug via INTRAVENOUS

## 2013-08-26 MED ORDER — SODIUM CHLORIDE 0.9 % IJ SOLN
10.0000 mL | Freq: Two times a day (BID) | INTRAMUSCULAR | Status: DC
  Administered 2013-08-27: 30 mL

## 2013-08-26 MED ORDER — LORAZEPAM 0.5 MG PO TABS: 0.5000 mg | ORAL_TABLET | Freq: Three times a day (TID) | ORAL | Status: DC | PRN

## 2013-08-26 MED ORDER — LIDOCAINE HCL 1 % IJ SOLN
INTRAMUSCULAR | Status: AC
  Filled 2013-08-26: qty 10

## 2013-08-26 NOTE — Progress Notes (Signed)
Utilization review completed. Brownie Gockel, RN, BSN. 

## 2013-08-26 NOTE — Sedation Documentation (Signed)
Attempted to call report, was told to call back in 12min, nurse off the floor.  Pt moved to bed, HOB raised. Oxygen decreased to [facility]. Pt continues to c/o mild abd pain at present.

## 2013-08-26 NOTE — Progress Notes (Signed)
Patient ID: [name], female   DOB: 1925/03/09, 80 y.o.   MRN: [id number]     [facility]      [address] [place], [place] [address]    Phone: [phone] FAX: [phone]     Subjective: Continues to have pain, no improvement.  Febrile.  White count unchanged.    Objective:  Vital signs:  Filed Vitals:   08/26/13 0338 08/26/13 0400 08/26/13 0500 08/26/13 0600  BP:  108/49 121/42 108/54  Pulse:  101 102 112  Temp: 100.9 F (38.3 C)     TempSrc: Oral     Resp:  _0 Height:      Weight:      SpO2:  96% 97% 97%       Intake/Output   Yesterday:  08/06 0701 - 08/07 0700 In: 4017.1 [I.V.:3617.1; IV Piggyback:400] Out: 500 [Urine:500] This shift:    I/O last 3 completed shifts: In: 4017.1 [I.V.:3617.1; IV Piggyback:400] Out: 500 [Urine:500]      Physical Exam: General: Pt awake/alert/oriented x4 in noacute distress Abdomen: Soft.  Nondistended.  Moderate TTP to pelvic region, right greater than left.  No evidence of peritonitis.  No incarcerated hernias.   Problem List:   Principal Problem:   Sepsis Active Problems:   HYPOTHYROIDISM   Iron deficiency anemia, unspecified   HYPERTENSION   Atrial flutter   Unspecified constipation   Metabolic acidosis   Diverticulitis of colon with perforation    Results:   Labs: Results for orders placed during the hospital encounter of 08/25/13 (from the past 48 hour(s))  CBC     Status: Abnormal   Collection Time    08/25/13 10:08 AM      Result Value Ref Range   WBC 21.9 (*) 4.0 - 10.5 K/uL   RBC 4.20  3.87 - 5.11 MIL/uL   Hemoglobin 14.8  12.0 - 15.0 g/dL   HCT 44.3  36.0 - 46.0 %   MCV 105.5 (*) 78.0 - 100.0 fL   MCH 35.2 (*) 26.0 - 34.0 pg   MCHC 33.4  30.0 - 36.0 g/dL   RDW 12.9  11.5 - 15.5 %   Platelets 268  150 - 400 K/uL  COMPREHENSIVE METABOLIC PANEL     Status: Abnormal   Collection Time    08/25/13 10:08 AM      Result Value Ref Range    Sodium 140  137 - 147 mEq/L   Potassium 3.6 (*) 3.7 - 5.3 mEq/L   Chloride 95 (*) 96 - 112 mEq/L   CO2 29  19 - 32 mEq/L   Glucose, Bld 123 (*) 70 - 99 mg/dL   BUN 13  6 - 23 mg/dL   Creatinine, Ser 0.82  0.50 - 1.10 mg/dL   Calcium 8.8  8.4 - 10.5 mg/dL   Total Protein 7.7  6.0 - 8.3 g/dL   Albumin 3.2 (*) 3.5 - 5.2 g/dL   AST 24  0 - 37 U/L   ALT 31  0 - 35 U/L   Alkaline Phosphatase 90  39 - 117 U/L   Total Bilirubin 1.2  0.3 - 1.2 mg/dL   GFR calc non Af Amer 63 (*) >90 mL/min   GFR calc Af Amer 74 (*) >90 mL/min   Comment: (NOTE)     The eGFR has been calculated using the CKD EPI equation.     This calculation has not been validated in all  clinical situations.     eGFR's persistently <90 mL/min signify possible Chronic Kidney     Disease.   Anion gap 16 (*) 5 - 15  LIPASE, BLOOD     Status: None   Collection Time    08/25/13 10:08 AM      Result Value Ref Range   Lipase 13  11 - 59 U/L  PROTIME-INR     Status: Abnormal   Collection Time    08/25/13 10:08 AM      Result Value Ref Range   Prothrombin Time 18.0 (*) 11.6 - 15.2 seconds   INR 1.49  0.00 - 1.49  I-STAT TROPOININ, ED     Status: None   Collection Time    08/25/13 10:15 AM      Result Value Ref Range   Troponin i, poc 0.00  0.00 - 0.08 ng/mL   Comment 3            Comment: Due to the release kinetics of cTnI,     a negative result within the first hours     of the onset of symptoms does not rule out     myocardial infarction with certainty.     If myocardial infarction is still suspected,     repeat the test at appropriate intervals.  URINALYSIS, ROUTINE W REFLEX MICROSCOPIC     Status: Abnormal   Collection Time    08/25/13 11:06 AM      Result Value Ref Range   Color, Urine YELLOW  YELLOW   APPearance HAZY (*) CLEAR   Specific Gravity, Urine 1.019  1.005 - 1.030   pH 7.0  5.0 - 8.0   Glucose, UA NEGATIVE  NEGATIVE mg/dL   Hgb urine dipstick SMALL (*) NEGATIVE   Bilirubin Urine NEGATIVE   NEGATIVE   Ketones, ur NEGATIVE  NEGATIVE mg/dL   Protein, ur 30 (*) NEGATIVE mg/dL   Urobilinogen, UA 0.2  0.0 - 1.0 mg/dL   Nitrite NEGATIVE  NEGATIVE   Leukocytes, UA SMALL (*) NEGATIVE  URINE MICROSCOPIC-ADD ON     Status: Abnormal   Collection Time    08/25/13 11:06 AM      Result Value Ref Range   Squamous Epithelial / LPF FEW (*) RARE   WBC, UA 3-6  <3 WBC/hpf   RBC / HPF 0-2  <3 RBC/hpf   Bacteria, UA MANY (*) RARE  I-STAT CG4 LACTIC ACID, ED     Status: Abnormal   Collection Time    08/25/13 11:25 AM      Result Value Ref Range   Lactic Acid, Venous 3.76 (*) 0.5 - 2.2 mmol/L  MRSA PCR SCREENING     Status: None   Collection Time    08/25/13  6:02 PM      Result Value Ref Range   MRSA by PCR NEGATIVE  NEGATIVE   Comment:            The GeneXpert MRSA Assay (FDA     approved for NASAL specimens     only), is one component of a     comprehensive MRSA colonization     surveillance program. It is not     intended to diagnose MRSA     infection nor to guide or     monitor treatment for     MRSA infections.  BASIC METABOLIC PANEL     Status: Abnormal   Collection Time    08/25/13  7:30 PM  Result Value Ref Range   Sodium 138  137 - 147 mEq/L   Potassium 3.2 (*) 3.7 - 5.3 mEq/L   Chloride 98  96 - 112 mEq/L   CO2 26  19 - 32 mEq/L   Glucose, Bld 111 (*) 70 - 99 mg/dL   BUN 11  6 - 23 mg/dL   Creatinine, Ser 0.81  0.50 - 1.10 mg/dL   Calcium 8.1 (*) 8.4 - 10.5 mg/dL   GFR calc non Af Amer 64 (*) >90 mL/min   GFR calc Af Amer 75 (*) >90 mL/min   Comment: (NOTE)     The eGFR has been calculated using the CKD EPI equation.     This calculation has not been validated in all clinical situations.     eGFR's persistently <90 mL/min signify possible Chronic Kidney     Disease.   Anion gap 14  5 - 15  CBC     Status: Abnormal   Collection Time    08/25/13  7:30 PM      Result Value Ref Range   WBC 24.9 (*) 4.0 - 10.5 K/uL   RBC 3.54 (*) 3.87 - 5.11 MIL/uL    Hemoglobin 12.7  12.0 - 15.0 g/dL   HCT 38.7  36.0 - 46.0 %   MCV 109.3 (*) 78.0 - 100.0 fL   MCH 35.9 (*) 26.0 - 34.0 pg   MCHC 32.8  30.0 - 36.0 g/dL   RDW 12.9  11.5 - 15.5 %   Platelets 279  150 - 400 K/uL  PROTIME-INR     Status: Abnormal   Collection Time    08/26/13 12:55 AM      Result Value Ref Range   Prothrombin Time 19.1 (*) 11.6 - 15.2 seconds   INR 1.60 (*) 0.00 - 1.49  HEPARIN LEVEL (UNFRACTIONATED)     Status: Abnormal   Collection Time    08/26/13 12:55 AM      Result Value Ref Range   Heparin Unfractionated <0.10 (*) 0.30 - 0.70 IU/mL   Comment:            IF HEPARIN RESULTS ARE BELOW     EXPECTED VALUES, AND PATIENT     DOSAGE HAS BEEN CONFIRMED,     SUGGEST FOLLOW UP TESTING     OF ANTITHROMBIN III LEVELS.  CBC     Status: Abnormal   Collection Time    08/26/13 12:55 AM      Result Value Ref Range   WBC 24.9 (*) 4.0 - 10.5 K/uL   RBC 3.15 (*) 3.87 - 5.11 MIL/uL   Hemoglobin 11.1 (*) 12.0 - 15.0 g/dL   HCT 33.4 (*) 36.0 - 46.0 %   MCV 106.0 (*) 78.0 - 100.0 fL   MCH 35.2 (*) 26.0 - 34.0 pg   MCHC 33.2  30.0 - 36.0 g/dL   RDW 13.1  11.5 - 15.5 %   Platelets 232  150 - 400 K/uL  COMPREHENSIVE METABOLIC PANEL     Status: Abnormal   Collection Time    08/26/13 12:55 AM      Result Value Ref Range   Sodium 136 (*) 137 - 147 mEq/L   Potassium 4.0  3.7 - 5.3 mEq/L   Comment: DELTA CHECK NOTED     HEMOLYZED SPECIMEN, RESULTS MAY BE AFFECTED   Chloride 98  96 - 112 mEq/L   CO2 24  19 - 32 mEq/L   Glucose, Bld 121 (*) 70 - 99  mg/dL   BUN 10  6 - 23 mg/dL   Creatinine, Ser 0.78  0.50 - 1.10 mg/dL   Calcium 7.9 (*) 8.4 - 10.5 mg/dL   Total Protein 6.4  6.0 - 8.3 g/dL   Albumin 2.5 (*) 3.5 - 5.2 g/dL   AST 23  0 - 37 U/L   ALT 21  0 - 35 U/L   Alkaline Phosphatase 87  39 - 117 U/L   Total Bilirubin 1.2  0.3 - 1.2 mg/dL   GFR calc non Af Amer 74 (*) >90 mL/min   GFR calc Af Amer 86 (*) >90 mL/min   Comment: (NOTE)     The eGFR has been calculated using  the CKD EPI equation.     This calculation has not been validated in all clinical situations.     eGFR's persistently <90 mL/min signify possible Chronic Kidney     Disease.   Anion gap 14  5 - 15    Imaging / Studies: Ct Abdomen Pelvis W Contrast  08/25/2013   CLINICAL DATA:  Right-sided abdominal pain.  EXAM: CT ABDOMEN AND PELVIS WITH CONTRAST  TECHNIQUE: Multidetector CT imaging of the abdomen and pelvis was performed using the standard protocol following bolus administration of intravenous contrast.  CONTRAST:  9m OMNIPAQUE IOHEXOL 300 MG/ML  SOLN  COMPARISON:  None.  FINDINGS: There are 2 adjacent loops of thick-walled small bowel in the right anterior pelvis, 1 having a maximum wall thickness of 6 mm and the other 5 mm. There is inflammatory type stranding adjacent to these loops. In addition, a fluid collection containing small bubbles of air tracks along the posterior inferior margin of the more anterior loop. Date tubular fluid collection tracks along the inferior margin of the more inferior and posterior loop.  There is a larger extraluminal fluid collection in the inferior pelvis posterior to the lower uterine segment and to the left of the rectosigmoid. It measures 5.2 cm x 3 cm x 2.2 cm in size. It contain small bubbles of air. This is contiguous with the margin of the rectosigmoid, which may be the origin of this extraluminal collection. There are multiple diverticula noted along the sigmoid colon. This collection may be from a ruptured diverticulum.  There is a small round fluid collection in the right lower quadrant along the posterior peritoneal margin measuring 2 cm in greatest dimension. No air is seen within this collection.  No appendix is seen. However, the findings appear remote from the cecal tip that are most likely not the consequence of acute appendicitis.  There is subsegmental atelectasis at the lung bases. Small moderate hiatal hernia. Heart is mildly enlarged.  Liver, spleen  and gallbladder are unremarkable. No bile duct dilation.  There is dilation of the pancreatic duct, maximum of 8 mm in the pancreatic head. This is likely chronic. No pancreatic mass or inflammatory change.  Normal adrenal glands. Kidneys show mild cortical thinning. There is small bilateral low-density renal lesions that are likely cysts. No hydronephrosis. Ureters are unremarkable. No bladder mass or stone.  Uterus and adnexa are unremarkable other than the adjacent inflammatory changes described above. This  No generalized ascites.  No nondependent free intraperitoneal air.  There are degenerative changes throughout the visualized spine. Bones are demineralized. The right hip prosthesis is well-seated and aligned. There is a mild compression/ burst fracture of L4 that is old. Intervertebral cages have been placed at L3-L4-L4-L5.  IMPRESSION: 1. There are inflammatory changes in the pelvis and  right lower quadrant. 2. Two adjacent loops of small bowel in the anterior right pelvis show significant wall thickening, which may be the source of the inflammatory changes-- an infectious or inflammatory ileitis. The wall thickening could potentially be reactive. There are small adjacent extraluminal fluid collections containing small bubbles of air. A larger extraluminal fluid collection lies along the left margin of the rectosigmoid junction. Findings could be due to ruptured diverticulitis and peridiverticular abscess, with secondary reactive edema/inflammation of the loops of small bowel. 3. No appendix is visualized. Ruptured appendix is unlikely but not excluded-- the findings are not centered on the expected location of the appendix. 4. No other acute findings.   Electronically Signed   By: Lajean Manes M.D.   On: 08/25/2013 13:19   Dg Chest Portable 1 View  08/25/2013   CLINICAL DATA:  Chest pain, abdominal pain  EXAM: PORTABLE CHEST - 1 VIEW  COMPARISON:  06/16/2013  FINDINGS: Cardiomegaly is noted. Mild  elevation of the right hemidiaphragm. No acute infiltrate or pulmonary edema. Stable atelectasis or scarring in right midlung.  IMPRESSION: Cardiomegaly. Chronic elevation of the right hemidiaphragm. No active disease. Stable atelectasis or scarring right midlung.   Electronically Signed   By: Lahoma Crocker M.D.   On: 08/25/2013 09:54    Medications / Allergies:  Scheduled Meds: . ertapenem  1 g Intravenous Q24H  . levothyroxine  12.5 mcg Intravenous Daily   Continuous Infusions: . sodium chloride 100 mL/hr at 08/25/13 1627  . diltiazem (CARDIZEM) infusion 15 mg/hr (08/26/13 0345)   PRN Meds:.morphine injection, ondansetron (ZOFRAN) IV, ondansetron  Antibiotics: Anti-infectives   Start     Dose/Rate Route Frequency Ordered Stop   08/26/13 0800  ertapenem (INVANZ) 1 g in sodium chloride 0.9 % 50 mL IVPB     1 g 100 mL/hr over 30 Minutes Intravenous Every 24 hours 08/26/13 0747     08/26/13 0200  ciprofloxacin (CIPRO) IVPB 400 mg  Status:  Discontinued     400 mg 200 mL/hr over 60 Minutes Intravenous Every 12 hours 08/25/13 1646 08/26/13 0747   08/25/13 1645  metroNIDAZOLE (FLAGYL) IVPB 500 mg  Status:  Discontinued     500 mg 100 mL/hr over 60 Minutes Intravenous Every 8 hours 08/25/13 1636 08/26/13 0747   08/25/13 1345  ciprofloxacin (CIPRO) IVPB 400 mg     400 mg 200 mL/hr over 60 Minutes Intravenous  Once 08/25/13 1344 08/25/13 1515   08/25/13 1345  metroNIDAZOLE (FLAGYL) IVPB 500 mg     500 mg 100 mL/hr over 60 Minutes Intravenous  Once 08/25/13 1344 08/25/13 1554        Assessment/Plan (Hospital problem) Atrial flutter-heparin/cardizem, heparin gtt off at 0800 HTN Hypothyroidism IDA Chronic diastolic heart failure  (Principal problem) Abdominal pain, RLQ inflammatory process Diverticulitis with abscess  -IR consult for perc drain placement -change cipro/flagyl to Invanz -NPO -IVF -repeat labs in AM -resume heparin per IR after the procedure  Erby Pian,  [facility] Pager [phone] Office [phone]  08/26/2013 8:14 AM

## 2013-08-26 NOTE — Progress Notes (Signed)
INITIAL NUTRITION ASSESSMENT  DOCUMENTATION CODES Per approved criteria  -Obesity Unspecified   INTERVENTION: - If diet advanced, recommend adding Ensure Complete po once daily, each supplement provides 350 kcal and 13 grams of protein - If diet advanced, recommend adding Prostat once daily, each supplement provides 100 kcal and 15 g of protein. - If pt expected to be NPO for greater than 7-10 days, recommend TPN per pharmacy. - Diet advancement as tolerated per MD discretion. - RD will continue to monitor for Nutrition Care Plan.  NUTRITION DIAGNOSIS: Inadequate oral intake related to inability to eat as evidenced by NPO.   Goal: Pt to meet >/= 90% of their estimated nutrition needs   Monitor:  Weight trends, NPO status, need for TPN, addition of nutritional supplements, I/O's  Reason for Assessment: Consult for nutrition evaluation  80 y.o. female  Admitting Dx: Sepsis  ASSESSMENT: 80 y.o. female with past medical history of atrial fibrillation on chronic anticoagulation and recent fall last month ago which led to an open fracture of the lower extremity requiring surgical intervention post wound VAC. Patient was in skilled nursing for rehabilitation and started having left lower quadrant abdominal pain the last few days to the point where she could not take it anymore and came into the emergency room.  - CT scan of the abdomen concerning for infectious ileitis, possible ruptured diverticulitis and peridiverticular abscess.  - Surgery eval recommended IR placement of perc drain. - Pt reports no recent weight loss and says that she was eating well prior to admission. She drinks one Ensure nutritional supplement per day at home.   Height: Ht Readings from Last 1 Encounters:  08/25/13 5\' 3"  (1.6 m)    Weight: Wt Readings from Last 1 Encounters:  08/25/13 187 lb 2.7 oz (84.9 kg)    Ideal Body Weight: 52.4 kg  % Ideal Body Weight: 162%  Wt Readings from Last 10  Encounters:  08/25/13 187 lb 2.7 oz (84.9 kg)  08/22/13 181 lb (82.101 kg)  08/22/13 181 lb (82.101 kg)  06/17/13 187 lb (84.823 kg)  06/17/13 187 lb (84.823 kg)  06/17/13 187 lb (84.823 kg)  02/23/13 187 lb (84.823 kg)  01/19/13 182 lb 9.6 oz (82.827 kg)  10/04/12 179 lb (81.194 kg)  07/21/12 180 lb (81.647 kg)    Usual Body Weight: 183 lbs  % Usual Body Weight: 102%  BMI:  Body mass index is 33.16 kg/(m^2).  Estimated Nutritional Needs: Kcal: 1750-1950 Protein: 110-125 g Fluid: 1.8-2.0 L/day  Skin: wound vac on left tibial area, closed incision on left leg  Diet Order: NPO  EDUCATION NEEDS: -Education not appropriate at this time   Intake/Output Summary (Last 24 hours) at 08/26/13 1044 Last data filed at 08/26/13 1000  Gross per 24 hour  Intake 4539.13 ml  Output   1150 ml  Net 3389.13 ml    Last BM: prior to admission   Labs:   Recent Labs Lab 08/25/13 1008 08/25/13 1930 08/26/13 0055  NA 140 138 136*  K 3.6* 3.2* 4.0  CL 95* 98 98  CO2 29 26 24   BUN 13 11 10   CREATININE 0.82 0.81 0.78  CALCIUM 8.8 8.1* 7.9*  GLUCOSE 123* 111* 121*    CBG (last 3)  No results found for this basename: GLUCAP,  in the last 72 hours  Scheduled Meds: . ertapenem  1 g Intravenous Q24H  . levothyroxine  12.5 mcg Intravenous Daily    Continuous Infusions: . sodium chloride 100 mL/hr  at 08/25/13 1627  . diltiazem (CARDIZEM) infusion 15 mg/hr (08/26/13 0600)    Past Medical History  Diagnosis Date  . Hypertension   . LBP (low back pain)     severe lumbar spondylosis s/p L3/L4,L4/L5 fusion 12/10  . Osteopenia   . B12 deficiency     boderline  . Hypothyroidism   . Chronic constipation   . History of colitis     DX  2003  WITH ISCHEMIC COLITIS  . History of GI bleed     2011--- UPPER SECONARY GASTRIC ULCER FROM NSAID USE  . History of Helicobacter pylori infection     2011  . GERD (gastroesophageal reflux disease)   . H/O hiatal hernia   .  Diverticulosis of colon   . History of ulcer of lower limb     2012   RIGHT LOWER EXTREMITIY--  STATSIS ULCER  . Bilateral edema of lower extremity   . RBBB   . Persistent atrial fibrillation     CARDIOLOGIST-   DR Burman Foster  . Lumbar spondylolysis   . Anticoagulated on Coumadin   . Hyperlipidemia   . Open wound of left lower leg   . Iron deficiency anemia 2011    elevated MCV  . OA (osteoarthritis)     "hands" (08/25/2013)  . Anxiety     Past Surgical History  Procedure Laterality Date  . Shoulder open rotator cuff repair Left 1997  . Total hip arthroplasty Right 06-07-2010  . External fixation leg Left 06/16/2013    Procedure: EXTERNAL FIXATION LEG;  Surgeon: Meredith Pel, MD;  Location: [place];  Service: Orthopedics;  Laterality: Left;  . I&d extremity Left 06/16/2013    Procedure: IRRIGATION AND DEBRIDEMENT EXTREMITY;  Surgeon: Meredith Pel, MD;  Location: [place];  Service: Orthopedics;  Laterality: Left;  . Application of wound vac Left 06/16/2013    Procedure: APPLICATION OF WOUND VAC;  Surgeon: Meredith Pel, MD;  Location: [place];  Service: Orthopedics;  Laterality: Left;  . Tibia im nail insertion Left 06/21/2013    Procedure: INTRAMEDULLARY (IM) NAIL TIBIAL;  Surgeon: Meredith Pel, MD;  Location: [place];  Service: Orthopedics;  Laterality: Left;  . External fixation removal Left 06/21/2013    Procedure: REMOVAL EXTERNAL FIXATION LEG;  Surgeon: Meredith Pel, MD;  Location: [place];  Service: Orthopedics;  Laterality: Left;  . Application of a-cell of extremity Left 06/21/2013    Procedure: APPLICATION OF A-CELL OF EXTREMITY;  Surgeon: Theodoro Kos, DO;  Location: [place];  Service: Plastics;  Laterality: Left;  . Application of wound vac Left 06/21/2013    Procedure: APPLICATION OF WOUND VAC;  Surgeon: Theodoro Kos, DO;  Location: [place];  Service: Plastics;  Laterality: Left;  . Lumbar fusion  01-02-2009    L3-L5  . Transthoracic echocardiogram  09-11-2010   DR  MCALHANY    LVSF  55-60%/  MILD MV CALCIFICATION WITH NO STENOSIS/  MILD MR & TR / MODERATE LAE/  MODERATE TO SEVERE RAE  . Tonsillectomy  AS CHILD  . Application of a-cell of extremity Left 08/22/2013    Procedure: APPLICATION OF A-CELL/VAC TO LOWER LEFT LEG WOUND;  Surgeon: Theodoro Kos, DO;  Location: [place];  Service: Plastics;  Laterality: Left;  . Back surgery    . Upper gi endoscopy      Terrace Arabia RD, LDN

## 2013-08-26 NOTE — Progress Notes (Signed)
Peripherally Inserted Central Catheter/Midline Placement  The IV Nurse has discussed with the patient and/or persons authorized to consent for the patient, the purpose of this procedure and the potential benefits and risks involved with this procedure.  The benefits include less needle sticks, lab draws from the catheter and patient may be discharged home with the catheter.  Risks include, but not limited to, infection, bleeding, blood clot (thrombus formation), and puncture of an artery; nerve damage and irregular heat beat.  Alternatives to this procedure were also discussed.  PICC/Midline Placement Documentation  PICC / Midline Double Lumen 21/62/44 PICC Right Basilic 41 cm 0 cm (Active)  Indication for Insertion or Continuance of Line Prolonged intravenous therapies 08/26/2013  3:05 PM  Exposed Catheter (cm) 0 cm 08/26/2013  3:05 PM  Lumen #1 Status Flushed;Saline locked;Blood return noted 08/26/2013  3:05 PM  Lumen #2 Status Flushed;Saline locked;Blood return noted 08/26/2013  3:05 PM  Dressing Change Due 09/02/13 08/26/2013  3:05 PM       Gordan Payment 08/26/2013, 3:07 PM

## 2013-08-26 NOTE — Procedures (Signed)
Successful LT TRANSGLUTEAL 10 FR PELVIC ABSCESS DRAIN INSERTION NO COMP STABLE 25 CC FECAL FLUID ASPIRATED GS/CX SENT FULL REPORT IN PACS

## 2013-08-26 NOTE — Consult Note (Signed)
Reason for Consult:Pelvic abscess Consulting Radiologist: Miles Costain Referring Physician: Andrey Campanile   HPI: [name] is an 80 y.o. female with diverticulitis and pelvic abscess. Surgery eval recommends IR placement of perc drain. Pt has complex medical history including afib. She is normally on Coumadin but that has been stopped. She has been on IV heparin drip, this was stopped today at 0800. Chart, imaging, labs, meds reviewed.  Past Medical History:  Past Medical History  Diagnosis Date  . Hypertension   . LBP (low back pain)     severe lumbar spondylosis s/p L3/L4,L4/L5 fusion 12/10  . Osteopenia   . B12 deficiency     boderline  . Hypothyroidism   . Chronic constipation   . History of colitis     DX  2003  WITH ISCHEMIC COLITIS  . History of GI bleed     2011--- UPPER SECONARY GASTRIC ULCER FROM NSAID USE  . History of Helicobacter pylori infection     2011  . GERD (gastroesophageal reflux disease)   . H/O hiatal hernia   . Diverticulosis of colon   . History of ulcer of lower limb     2012   RIGHT LOWER EXTREMITIY--  STATSIS ULCER  . Bilateral edema of lower extremity   . RBBB   . Persistent atrial fibrillation     CARDIOLOGIST-   DR Newell Coral  . Lumbar spondylolysis   . Anticoagulated on Coumadin   . Hyperlipidemia   . Open wound of left lower leg   . Iron deficiency anemia 2011    elevated MCV  . OA (osteoarthritis)     "hands" (08/25/2013)  . Anxiety     Surgical History:  Past Surgical History  Procedure Laterality Date  . Shoulder open rotator cuff repair Left 1997  . Total hip arthroplasty Right 06-07-2010  . External fixation leg Left 06/16/2013    Procedure: EXTERNAL FIXATION LEG;  Surgeon: Cammy Copa, MD;  Location: [facility] OR;  Service: Orthopedics;  Laterality: Left;  . I&d extremity Left 06/16/2013    Procedure: IRRIGATION AND DEBRIDEMENT EXTREMITY;  Surgeon: Cammy Copa, MD;  Location: [facility] OR;  Service: Orthopedics;  Laterality: Left;  .  Application of wound vac Left 06/16/2013    Procedure: APPLICATION OF WOUND VAC;  Surgeon: Cammy Copa, MD;  Location: [facility] OR;  Service: Orthopedics;  Laterality: Left;  . Tibia im nail insertion Left 06/21/2013    Procedure: INTRAMEDULLARY (IM) NAIL TIBIAL;  Surgeon: Cammy Copa, MD;  Location: [facility] OR;  Service: Orthopedics;  Laterality: Left;  . External fixation removal Left 06/21/2013    Procedure: REMOVAL EXTERNAL FIXATION LEG;  Surgeon: Cammy Copa, MD;  Location: [facility] OR;  Service: Orthopedics;  Laterality: Left;  . Application of a-cell of extremity Left 06/21/2013    Procedure: APPLICATION OF A-CELL OF EXTREMITY;  Surgeon: Wayland Denis, DO;  Location: [facility] OR;  Service: Plastics;  Laterality: Left;  . Application of wound vac Left 06/21/2013    Procedure: APPLICATION OF WOUND VAC;  Surgeon: Wayland Denis, DO;  Location: [facility] OR;  Service: Plastics;  Laterality: Left;  . Lumbar fusion  01-02-2009    L3-L5  . Transthoracic echocardiogram  09-11-2010   DR MCALHANY    LVSF  55-60%/  MILD MV CALCIFICATION WITH NO STENOSIS/  MILD MR & TR / MODERATE LAE/  MODERATE TO SEVERE RAE  . Tonsillectomy  AS CHILD  . Application of a-cell of extremity Left 08/22/2013    Procedure: APPLICATION OF  A-CELL/VAC TO LOWER LEFT LEG WOUND;  Surgeon: Theodoro Kos, DO;  Location: [place];  Service: Plastics;  Laterality: Left;  . Back surgery    . Upper gi endoscopy      Family History:  Family History  Problem Relation Age of Onset  . Cancer Father   . Hypertension Other     Social History:  reports that she has never smoked. She has never used smokeless tobacco. She reports that she drinks about 4.2 ounces of alcohol per week. She reports that she does not use illicit drugs.  Allergies:  Allergies  Allergen Reactions  . Atenolol Nausea And Vomiting  . Clarithromycin Nausea And Vomiting  . Codeine Sulfate Nausea Only  . Macrodantin Other (See Comments)    Long time ago -  reaction unknown  . Percocet [Oxycodone-Acetaminophen] Nausea And Vomiting    Medications: Current facility-administered medications:0.9 %  sodium chloride infusion, , Intravenous, Continuous, Annita Brod, MD, Last Rate: 100 mL/hr at 08/25/13 1627;  diltiazem (CARDIZEM) 100 mg in dextrose 5 % 100 mL (1 mg/mL) infusion, 5-15 mg/hr, Intravenous, Continuous, Evelina Bucy, MD, Last Rate: 15 mL/hr at 08/26/13 0345, 15 mg/hr at 08/26/13 0345 ertapenem (INVANZ) 1 g in sodium chloride 0.9 % 50 mL IVPB, 1 g, Intravenous, Q24H, Emina Riebock, NP;  levothyroxine (SYNTHROID, LEVOTHROID) injection 12.5 mcg, 12.5 mcg, Intravenous, Daily, Annita Brod, MD;  morphine 2 MG/ML injection 2-4 mg, 2-4 mg, Intravenous, Q2H PRN, Cherene Altes, MD;  ondansetron [facility]) injection 4 mg, 4 mg, Intravenous, Q6H PRN, Annita Brod, MD, 4 mg at 08/25/13 1621 ondansetron (ZOFRAN) tablet 4 mg, 4 mg, Oral, Q6H PRN, Annita Brod, MD  ROS: See HPI for pertinent findings, otherwise complete 10 system review negative.  Physical Exam: Blood pressure 116/55, pulse 88, temperature 98.5 F (36.9 C), temperature source Oral, resp. rate 29, height $RemoveBe'5\' 3"'imqeRmkHr$  (1.6 m), weight 187 lb 2.7 oz (84.9 kg), SpO2 96.00%. Awake, weak but oriented and coherent ENT: unremarkable airway Lungs: CTA Heart: Irreg rhythm, irreg rate 80s-100s Abd: soft, tender across low abd    Labs: CBC  Recent Labs  08/25/13 1930 08/26/13 0055  WBC 24.9* 24.9*  HGB 12.7 11.1*  HCT 38.7 33.4*  PLT 279 232   MET  Recent Labs  08/25/13 1930 08/26/13 0055  NA 138 136*  K 3.2* 4.0  CL 98 98  CO2 26 24  GLUCOSE 111* 121*  BUN 11 10  CREATININE 0.81 0.78  CALCIUM 8.1* 7.9*    Recent Labs  08/25/13 1008 08/26/13 0055  PROT 7.7 6.4  ALBUMIN 3.2* 2.5*  AST 24 23  ALT 31 21  ALKPHOS 90 87  BILITOT 1.2 1.2  LIPASE 13  --    PT/INR  Recent Labs  08/25/13 1008 08/26/13 0055  LABPROT 18.0* 19.1*  INR 1.49 1.60*    Ct  Abdomen Pelvis W Contrast  08/25/2013   CLINICAL DATA:  Right-sided abdominal pain.  EXAM: CT ABDOMEN AND PELVIS WITH CONTRAST  TECHNIQUE: Multidetector CT imaging of the abdomen and pelvis was performed using the standard protocol following bolus administration of intravenous contrast.  CONTRAST:  65mL OMNIPAQUE IOHEXOL 300 MG/ML  SOLN  COMPARISON:  None.  FINDINGS: There are 2 adjacent loops of thick-walled small bowel in the right anterior pelvis, 1 having a maximum wall thickness of 6 mm and the other 5 mm. There is inflammatory type stranding adjacent to these loops. In addition, a fluid collection containing small bubbles  of air tracks along the posterior inferior margin of the more anterior loop. Date tubular fluid collection tracks along the inferior margin of the more inferior and posterior loop.  There is a larger extraluminal fluid collection in the inferior pelvis posterior to the lower uterine segment and to the left of the rectosigmoid. It measures 5.2 cm x 3 cm x 2.2 cm in size. It contain small bubbles of air. This is contiguous with the margin of the rectosigmoid, which may be the origin of this extraluminal collection. There are multiple diverticula noted along the sigmoid colon. This collection may be from a ruptured diverticulum.  There is a small round fluid collection in the right lower quadrant along the posterior peritoneal margin measuring 2 cm in greatest dimension. No air is seen within this collection.  No appendix is seen. However, the findings appear remote from the cecal tip that are most likely not the consequence of acute appendicitis.  There is subsegmental atelectasis at the lung bases. Small moderate hiatal hernia. Heart is mildly enlarged.  Liver, spleen and gallbladder are unremarkable. No bile duct dilation.  There is dilation of the pancreatic duct, maximum of 8 mm in the pancreatic head. This is likely chronic. No pancreatic mass or inflammatory change.  Normal adrenal  glands. Kidneys show mild cortical thinning. There is small bilateral low-density renal lesions that are likely cysts. No hydronephrosis. Ureters are unremarkable. No bladder mass or stone.  Uterus and adnexa are unremarkable other than the adjacent inflammatory changes described above. This  No generalized ascites.  No nondependent free intraperitoneal air.  There are degenerative changes throughout the visualized spine. Bones are demineralized. The right hip prosthesis is well-seated and aligned. There is a mild compression/ burst fracture of L4 that is old. Intervertebral cages have been placed at L3-L4-L4-L5.  IMPRESSION: 1. There are inflammatory changes in the pelvis and right lower quadrant. 2. Two adjacent loops of small bowel in the anterior right pelvis show significant wall thickening, which may be the source of the inflammatory changes-- an infectious or inflammatory ileitis. The wall thickening could potentially be reactive. There are small adjacent extraluminal fluid collections containing small bubbles of air. A larger extraluminal fluid collection lies along the left margin of the rectosigmoid junction. Findings could be due to ruptured diverticulitis and peridiverticular abscess, with secondary reactive edema/inflammation of the loops of small bowel. 3. No appendix is visualized. Ruptured appendix is unlikely but not excluded-- the findings are not centered on the expected location of the appendix. 4. No other acute findings.   Electronically Signed   By: Lajean Manes M.D.   On: 08/25/2013 13:19   Dg Chest Portable 1 View  08/25/2013   CLINICAL DATA:  Chest pain, abdominal pain  EXAM: PORTABLE CHEST - 1 VIEW  COMPARISON:  06/16/2013  FINDINGS: Cardiomegaly is noted. Mild elevation of the right hemidiaphragm. No acute infiltrate or pulmonary edema. Stable atelectasis or scarring in right midlung.  IMPRESSION: Cardiomegaly. Chronic elevation of the right hemidiaphragm. No active disease. Stable  atelectasis or scarring right midlung.   Electronically Signed   By: Lahoma Crocker M.D.   On: 08/25/2013 09:54    Assessment/Plan: Diverticulitis with pelvic abscess by CT For CT guided drainage Discussed procedure with pt, transgluteal route, risks, complications, use of sedation, and expectations/care of drain after placement Labs reviewed, ok Consent signed in chart  Ascencion Dike PA-C 08/26/2013, 9:03 AM

## 2013-08-26 NOTE — Sedation Documentation (Signed)
Waiting for transport, pt resting. Vitals stable on monitor.

## 2013-08-26 NOTE — Progress Notes (Signed)
Pt seen around 7:40pm States she feels better IR placed drain in pelvic diverticular abscess  Alert, ox4, nontoxic (looks better) Soft, RLQ TTP; decreased suprapubic/deep LLQ TTP Drain - thickish yellowish fluid  Rectosigmoid diverticulitis with pericolonic abscess  RLQ SB enteritis  Still not clear etiology of RLQ SB pathology - could be related to pelvic process Switched cipro/flagyl to invanz Cont perc drain - f/u cx Bowel rest for now Hopefully WBC will start to go down  No family at [facility]. Redmond Pulling, MD, FACS General, Bariatric, & Minimally Invasive Surgery [facility], [place]

## 2013-08-26 NOTE — Sedation Documentation (Signed)
Pt moaning w/ insertion of catheter. VSS

## 2013-08-26 NOTE — Consult Note (Addendum)
[place] nurse consulted for NPWT VAC dressing from home. Pt is followed by Dr. Migdalia Dk, Plastics.  I have contacted her PA Shawn Rayburn and verified orders.  Pt with VAC with ACell placement 08/22/13.  Dressing to stay in place until 08/29/13 at which time plastics will evaluate wound and replace.  I will hook patient up to hospital device while inpatient.     Para March RN,CWOCN [phone]  [id number] Addendum: disconnected pt from home VAC unit and removed canister into the trash. Gave home unit to the family including the charger.  I have hooked the patient up to hospital device at 61mmHG.   [place], [place]

## 2013-08-26 NOTE — Progress Notes (Signed)
[place] for Heparin Indication: atrial fibrillation  Allergies  Allergen Reactions  . Atenolol Nausea And Vomiting  . Clarithromycin Nausea And Vomiting  . Codeine Sulfate Nausea Only  . Macrodantin Other (See Comments)    Long time ago - reaction unknown  . Percocet [Oxycodone-Acetaminophen] Nausea And Vomiting    Patient Measurements: Height: 5\' 3"  (160 cm) Weight: 187 lb 2.7 oz (84.9 kg) IBW/kg (Calculated) : 52.4 Heparin Dosing Weight: 70 kg  Vital Signs: Temp: 98.7 F (37.1 C) (08/06 2323) Temp src: Oral (08/06 2323) BP: 122/67 mmHg (08/07 0100) Pulse Rate: 92 (08/07 0100)  Labs:  Recent Labs  08/25/13 1008 08/25/13 1930 08/26/13 0055  HGB 14.8 12.7 11.1*  HCT 44.3 38.7 33.4*  PLT 268 279 232  LABPROT 18.0*  --  19.1*  INR 1.49  --  1.60*  HEPARINUNFRC  --   --  <0.10*  CREATININE 0.82 0.81 0.78    Estimated Creatinine Clearance: 53.1 ml/min (by C-G formula based on Cr of 0.78).  Assessment: 80 yo female with Afib, Coumadin on hold, for heparin   Goal of Therapy:  Heparin level 0.3-0.7 units/ml Monitor platelets by anticoagulation protocol: Yes   Plan:  Increase Heparin 1200 units/hr Check heparin level in 8 hours.   Onita Pfluger, [place] Curb 08/26/2013,2:10 AM

## 2013-08-26 NOTE — Progress Notes (Signed)
[place] TEAM 1 - Stepdown/ICU TEAM Progress Note  KEON BENSCOTER NLZ:[id number] DOB: [birth year] DOA: 08/25/2013 PCP: Walker Kehr, MD  Admit HPI / Brief Narrative: 80 y.o. female w/ a past medical history of atrial fibrillation on chronic anticoagulation and recent fall last month which led to an open fracture of the lower extremity requiring surgical intervention post wound VAC. Patient was in skilled nursing for rehabilitation and started having R lower quadrant abdominal pain to the point where she could not take it anymore and came into the emergency room.   CT scan of the abdomen was noted for inflammatory changes in the pelvis and right lower quadrant with wall thickening of small bowel adjacent loops concerning for infectious ileitis and findings consistent with a possible ruptured diverticulitis and abscess. General Surgery was called for consultation. Lab work noted white blood cell count 21, anion gap of 16 with lactic acid level of 3.76. Patient was started on IV Cipro and Flagyl.   HPI/Subjective: Pt reports ongoing abdom pain, feeling very tired, and nausea.  She denies cp, sob, or ha.    Assessment/Plan:  Diverticulitis of colon with perforation As per Gen Surg - for percutaneous drainage in IR today   Sepsis (infection, WBC 25, HR 112) Sepsis physiology improving - cont vol expansion and abx tx   Metabolic acidosis Improving w/ volume resuscitation - follow   Chronic afib w/ acute RVR Cont IV cardizem and titrate as needed - IV heparin to resume after IR procedure  Chronic diastolic CHF EF 90% on echogram in 2012  UTI Current abx should provide adequate coverage  HTN BP currently well controlled - follow  Hypothyroid  Cont home synthroid dose  Hx of Fe deficient anemia  Macrocytosis B12 normal/high June 2014 - recheck   Mechanical fall with left open tibial fracture June 2015 Cont current prescribed wound care  Code Status: NO CODE BLUE  Family  Communication: no family present at time of exam Disposition Plan: SDU  Consultants: Gen Surgery   Procedures: none  Antibiotics: Cipro 8/06 Flagyl 8/06 Ertapenem 8/07 >>  DVT prophylaxis: IV heparin  Objective: Blood pressure 116/55, pulse 112, temperature 98.5 F (36.9 C), temperature source Oral, resp. rate 29, height 5\' 3"  (1.6 m), weight 84.9 kg (187 lb 2.7 oz), SpO2 96.00%.  Intake/Output Summary (Last 24 hours) at 08/26/13 0831 Last data filed at 08/26/13 2409  Gross per 24 hour  Intake 4017.13 ml  Output    500 ml  Net 3517.13 ml   Exam: General: No acute respiratory distress Lungs: Clear to auscultation bilaterally without wheezes or crackles Cardiovascular: irreg irreg - rate controlled - no appreciable M  Abdomen: obese, soft, diffusely tender but worse in R LQ - no rebound - no appreciable mass  Extremities: No significant cyanosis, clubbing, or edema bilateral lower extremities  Data Reviewed: Basic Metabolic Panel:  Recent Labs Lab 08/25/13 1008 08/25/13 1930 08/26/13 0055  NA 140 138 136*  K 3.6* 3.2* 4.0  CL 95* 98 98  CO2 29 26 24   GLUCOSE 123* 111* 121*  BUN 13 11 10   CREATININE 0.82 0.81 0.78  CALCIUM 8.8 8.1* 7.9*   Liver Function Tests:  Recent Labs Lab 08/25/13 1008 08/26/13 0055  AST 24 23  ALT 31 21  ALKPHOS 90 87  BILITOT 1.2 1.2  PROT 7.7 6.4  ALBUMIN 3.2* 2.5*    Recent Labs Lab 08/25/13 1008  LIPASE 13   No results found for this basename: AMMONIA,  in the last 168 hours  Coags:  Recent Labs Lab 08/22/13 1100 08/25/13 1008 08/26/13 0055  INR 1.14 1.49 1.60*   No results found for this basename: PTT,  in the last 168 hours  CBC:  Recent Labs Lab 08/25/13 1008 08/25/13 1930 08/26/13 0055  WBC 21.9* 24.9* 24.9*  HGB 14.8 12.7 11.1*  HCT 44.3 38.7 33.4*  MCV 105.5* 109.3* 106.0*  PLT 268 279 232   CBG: No results found for this basename: GLUCAP,  in the last 168 hours  Recent Results (from the  past 240 hour(s))  ANAEROBIC CULTURE     Status: None   Collection Time    08/22/13  1:41 PM      Result Value Ref Range Status   Specimen Description LEG LEFT BONE CHIP   Final   Special Requests     Final   Value: PATIENT ON FOLLOWING CIPRO 400 MG PRIOR TO CASE STARTING   Gram Stain     Final   Value: NO WBC SEEN     NO SQUAMOUS EPITHELIAL CELLS SEEN     NO ORGANISMS SEEN     Performed at [facility]   Culture     Final   Value: NO ANAEROBES ISOLATED; CULTURE IN PROGRESS FOR 5 DAYS     Performed at [facility]   Report Status PENDING   Incomplete  TISSUE CULTURE     Status: None   Collection Time    08/22/13  1:41 PM      Result Value Ref Range Status   Specimen Description LEG LEFT BONE CHIP   Final   Special Requests     Final   Value: PATIENT ON FOLLOWING CIPRO 400 MG PRIOR TO CASE STARTING   Gram Stain     Final   Value: NO WBC SEEN     NO SQUAMOUS EPITHELIAL CELLS SEEN     NO ORGANISMS SEEN     Performed at [facility]   Culture     Final   Value: FEW STAPHYLOCOCCUS AUREUS     Note: RIFAMPIN AND GENTAMICIN SHOULD NOT BE USED AS SINGLE DRUGS FOR TREATMENT OF STAPH INFECTIONS.     Performed at [facility]   Report Status 08/25/2013 FINAL   Final   Organism ID, Bacteria STAPHYLOCOCCUS AUREUS   Final  CULTURE, BLOOD (ROUTINE X 2)     Status: None   Collection Time    08/25/13  3:05 PM      Result Value Ref Range Status   Specimen Description BLOOD LEFT FOREARM   Final   Special Requests BOTTLES DRAWN AEROBIC ONLY 5CC   Final   Culture  Setup Time     Final   Value: 08/25/2013 18:56     Performed at [facility]   Culture     Final   Value:        BLOOD CULTURE RECEIVED NO GROWTH TO DATE CULTURE WILL BE HELD FOR 5 DAYS BEFORE ISSUING A FINAL NEGATIVE REPORT     Performed at [facility]   Report Status PENDING   Incomplete  CULTURE, BLOOD (ROUTINE X 2)     Status: None   Collection Time    08/25/13  3:45 PM       Result Value Ref Range Status   Specimen Description BLOOD LEFT FOREARM   Final   Special Requests BOTTLES DRAWN AEROBIC ONLY 5CC   Final   Culture  Setup  Time     Final   Value: 08/25/2013 20:08     Performed at [facility]   Culture     Final   Value:        BLOOD CULTURE RECEIVED NO GROWTH TO DATE CULTURE WILL BE HELD FOR 5 DAYS BEFORE ISSUING A FINAL NEGATIVE REPORT     Performed at [facility]   Report Status PENDING   Incomplete  MRSA PCR SCREENING     Status: None   Collection Time    08/25/13  6:02 PM      Result Value Ref Range Status   MRSA by PCR NEGATIVE  NEGATIVE Final   Comment:            The GeneXpert MRSA Assay (FDA     approved for NASAL specimens     only), is one component of a     comprehensive MRSA colonization     surveillance program. It is not     intended to diagnose MRSA     infection nor to guide or     monitor treatment for     MRSA infections.     Studies:  Recent x-ray studies have been reviewed in detail by the Attending Physician  Scheduled Meds:  Scheduled Meds: . ertapenem  1 g Intravenous Q24H  . levothyroxine  12.5 mcg Intravenous Daily    Time spent on care of this patient: 35 mins   Jenaro Souder T , MD   [facility] Office  [phone] Pager - Text Page per Shea Evans as per below:  On-Call/Text Page:      [email or web address]      password [email or web address]  If 7PM-7AM, please contact night-coverage [email or web address] Password [email or web address] 08/26/2013, 8:31 AM   LOS: 1 day

## 2013-08-27 ENCOUNTER — Inpatient Hospital Stay (HOSPITAL_COMMUNITY): Payer: Medicare Other

## 2013-08-27 LAB — CBC
HCT: 31.7 % — ABNORMAL LOW (ref 36.0–46.0)
Hemoglobin: 10.2 g/dL — ABNORMAL LOW (ref 12.0–15.0)
MCH: 34.3 pg — ABNORMAL HIGH (ref 26.0–34.0)
MCHC: 32.2 g/dL (ref 30.0–36.0)
MCV: 106.7 fL — ABNORMAL HIGH (ref 78.0–100.0)
Platelets: 233 10*3/uL (ref 150–400)
RBC: 2.97 MIL/uL — ABNORMAL LOW (ref 3.87–5.11)
RDW: 13.1 % (ref 11.5–15.5)
WBC: 18.5 10*3/uL — ABNORMAL HIGH (ref 4.0–10.5)

## 2013-08-27 LAB — FOLATE: Folate: 15.8 ng/mL

## 2013-08-27 LAB — COMPREHENSIVE METABOLIC PANEL
ALT: 15 U/L (ref 0–35)
AST: 16 U/L (ref 0–37)
Albumin: 2.1 g/dL — ABNORMAL LOW (ref 3.5–5.2)
Alkaline Phosphatase: 70 U/L (ref 39–117)
Anion gap: 10 (ref 5–15)
BUN: 14 mg/dL (ref 6–23)
CO2: 25 mEq/L (ref 19–32)
Calcium: 8.1 mg/dL — ABNORMAL LOW (ref 8.4–10.5)
Chloride: 102 mEq/L (ref 96–112)
Creatinine, Ser: 0.88 mg/dL (ref 0.50–1.10)
GFR calc Af Amer: 68 mL/min — ABNORMAL LOW (ref >90)
GFR calc non Af Amer: 58 mL/min — ABNORMAL LOW (ref >90)
Glucose, Bld: 129 mg/dL — ABNORMAL HIGH (ref 70–99)
Potassium: 3.5 mEq/L — ABNORMAL LOW (ref 3.7–5.3)
Sodium: 137 mEq/L (ref 137–147)
Total Bilirubin: 0.7 mg/dL (ref 0.3–1.2)
Total Protein: 6.1 g/dL (ref 6.0–8.3)

## 2013-08-27 LAB — IRON AND TIBC
Iron: 18 ug/dL — ABNORMAL LOW (ref 42–135)
Saturation Ratios: 13 % — ABNORMAL LOW (ref 20–55)
TIBC: 144 ug/dL — ABNORMAL LOW (ref 250–470)
UIBC: 126 ug/dL (ref 125–400)

## 2013-08-27 LAB — HEPARIN LEVEL (UNFRACTIONATED)
Heparin Unfractionated: 1.14 IU/mL — ABNORMAL HIGH (ref 0.30–0.70)
Heparin Unfractionated: <0.1 IU/mL — ABNORMAL LOW (ref 0.30–0.70)

## 2013-08-27 LAB — RETICULOCYTES
RBC.: 2.97 MIL/uL — ABNORMAL LOW (ref 3.87–5.11)
Retic Count, Absolute: 56.4 10*3/uL (ref 19.0–186.0)
Retic Ct Pct: 1.9 % (ref 0.4–3.1)

## 2013-08-27 LAB — VITAMIN B12: Vitamin B-12: 658 pg/mL (ref 211–911)

## 2013-08-27 LAB — ANAEROBIC CULTURE: Gram Stain: NONE SEEN

## 2013-08-27 LAB — PROTIME-INR
INR: 1.51 — ABNORMAL HIGH (ref 0.00–1.49)
Prothrombin Time: 18.2 seconds — ABNORMAL HIGH (ref 11.6–15.2)

## 2013-08-27 LAB — FERRITIN: Ferritin: 791 ng/mL — ABNORMAL HIGH (ref 10–291)

## 2013-08-27 LAB — LACTIC ACID, PLASMA: Lactic Acid, Venous: 0.8 mmol/L (ref 0.5–2.2)

## 2013-08-27 MED ORDER — POTASSIUM CHLORIDE 10 MEQ/100ML IV SOLN
10.0000 meq | INTRAVENOUS | Status: AC
  Administered 2013-08-27 (×4): 10 meq via INTRAVENOUS
  Filled 2013-08-27 (×4): qty 100

## 2013-08-27 MED ORDER — DEXTROSE-NACL 5-0.9 % IV SOLN
INTRAVENOUS | Status: AC
  Administered 2013-08-27: 10:00:00 via INTRAVENOUS

## 2013-08-27 MED ORDER — HEPARIN (PORCINE) IN NACL 100-0.45 UNIT/ML-% IJ SOLN
1500.0000 [IU]/h | INTRAMUSCULAR | Status: DC
  Administered 2013-08-27: 1200 [IU]/h via INTRAVENOUS
  Administered 2013-08-28: 1500 [IU]/h via INTRAVENOUS
  Filled 2013-08-27 (×3): qty 250

## 2013-08-27 NOTE — Progress Notes (Signed)
[place] for Heparin Indication: atrial fibrillation  Allergies  Allergen Reactions  . Atenolol Nausea And Vomiting  . Clarithromycin Nausea And Vomiting  . Codeine Sulfate Nausea Only  . Macrodantin Other (See Comments)    Long time ago - reaction unknown  . Percocet [Oxycodone-Acetaminophen] Nausea And Vomiting    Patient Measurements: Height: 5\' 3"  (160 cm) Weight: 187 lb 2.7 oz (84.9 kg) IBW/kg (Calculated) : 52.4 Heparin Dosing Weight: 70 kg  Vital Signs: Temp: 98.9 F (37.2 C) (08/08 1626) Temp src: Oral (08/08 1626) BP: 105/54 mmHg (08/08 1626) Pulse Rate: 68 (08/08 1626)  Labs:  Recent Labs  08/25/13 1008 08/25/13 1930 08/26/13 0055 08/27/13 0445 08/27/13 1625 08/27/13 1821  HGB 14.8 12.7 11.1* 10.2*  --   --   HCT 44.3 38.7 33.4* 31.7*  --   --   PLT 268 279 232 233  --   --   LABPROT 18.0*  --  19.1* 18.2*  --   --   INR 1.49  --  1.60* 1.51*  --   --   HEPARINUNFRC  --   --  <0.10*  --  1.14* <0.10*  CREATININE 0.82 0.81 0.78 0.88  --   --     Estimated Creatinine Clearance: 48.3 ml/min (by C-G formula based on Cr of 0.88).  Assessment: 80 yo female with Afib, Coumadin on hold since NPO for perc drain placement on 8/7. She was restarted on heparin at 1200 units/hr. An 8-hr HL was supra-therapeutic at 1.14 but per RN, the level was drawn from the same port that heparin was infusing into. The level was re-drawn from a different line and it remains undetectable. H/H has trended down (hgb 12.7>10.2), plt wnl and stable, no reported s/s bleeding.    Goal of Therapy:  Heparin level 0.3-0.7 units/ml Monitor platelets by anticoagulation protocol: Yes   Plan:  - Increase heparin infusion to at 1500 u/hr (previously undetectable at 1000 u/hr) - F/u AM HL  - Monitor CBC and s/s bleeding closely   Albertina Parr, PharmD.  Clinical Pharmacist Pager [phone]

## 2013-08-27 NOTE — Progress Notes (Signed)
[place] TEAM 1 - Stepdown/ICU TEAM Progress Note  JYLA HOPF HUD:[id number] DOB: [birth year] DOA: 08/25/2013 PCP: Walker Kehr, MD  Admit HPI / Brief Narrative: 80 y.o. female w/ a past medical history of atrial fibrillation on chronic anticoagulation and recent fall which led to an open fracture of the lower extremity requiring surgical intervention and wound care with wound VAC. Patient was in skilled nursing for rehabilitation and started having L lower quadrant abdominal pain to the point where she could not take it anymore and came into the emergency room.   CT scan of the abdomen was noted for inflammatory changes in the pelvis and right lower quadrant with wall thickening of small bowel adjacent loops concerning for infectious ileitis and findings consistent with a possible ruptured diverticulitis and abscess. General Surgery was called for consultation. Lab work noted white blood cell count 21, anion gap of 16 with lactic acid level of 3.76. Patient was started on IV Cipro and Flagyl.   Significant Events: 8/7 - transgluteal percutaneous drainage in IR 8/7 - PICC line placement  HPI/Subjective: Pt reports ongoing abdom discomfort, as well as back pain.  She is not yet feeling significantly better.  No cp, sob, or vomiting.    Assessment/Plan:  Diverticulitis of colon with perforation As per Gen Surg - s/p L transgluteal percutaneous drainage in IR 8/7 - diet per Gen Surg - abscess cultures pending   Sepsis (infection, WBC 25, HR 112) Sepsis physiology improving - cont abx tx   Metabolic acidosis resolved w/ volume resuscitation   Chronic afib w/ acute RVR RVR now resolved - keep on IV CCB unti able to tolerate oral intake - resume heparin gtt this morning w/o bolus - resume coumadin when tolerating oral intake   Chronic diastolic CHF EF 85% on echogram in 2012 - currently euvolemic   UTI Current abx should provide adequate coverage - appears that urine was not sent  for cx  HTN BP currently well controlled - follow  Hypothyroid  Cont home synthroid dose  Hx of Fe deficient anemia Fe studies pending  Macrocytosis B12 normal/high June 2014 - recheck pending  Mechanical fall with left open tibial fracture June 2015 Cont current prescribed wound care (followed by Dr. Migdalia Dk) - dressing to stay in place until 08/29/13 at which time Plastics will evaluate wound and replace  Obesity Unspecified   Code Status: NO CODE BLUE  Family Communication: spoke w/ family at length at bedside  Disposition Plan: SDU  Consultants: Gen Surgery  IR  Antibiotics: Cipro 8/06 Flagyl 8/06 Ertapenem 8/07 >>  DVT prophylaxis: IV heparin  Objective: Blood pressure 108/73, pulse 77, temperature 99.3 F (37.4 C), temperature source Oral, resp. rate 16, height 5\' 3"  (1.6 m), weight 84.9 kg (187 lb 2.7 oz), SpO2 97.00%.  Intake/Output Summary (Last 24 hours) at 08/27/13 0925 Last data filed at 08/27/13 0827  Gross per 24 hour  Intake 2713.25 ml  Output    882 ml  Net 1831.25 ml   Exam: General: No acute respiratory distress Lungs: Clear to auscultation bilaterally without wheezes or crackles Cardiovascular: irreg irreg - rate controlled - no appreciable M  Abdomen: obese, soft, diffusely tender - no rebound - no appreciable mass  Extremities: No significant cyanosis, clubbing, or edema bilateral lower extremities  Data Reviewed: Basic Metabolic Panel:  Recent Labs Lab 08/25/13 1008 08/25/13 1930 08/26/13 0055 08/27/13 0445  NA 140 138 136* 137  K 3.6* 3.2* 4.0 3.5*  CL 95* 98 98  102  CO2 29 26 24 25   GLUCOSE 123* 111* 121* 129*  BUN 13 11 10 14   CREATININE 0.82 0.81 0.78 0.88  CALCIUM 8.8 8.1* 7.9* 8.1*   Liver Function Tests:  Recent Labs Lab 08/25/13 1008 08/26/13 0055 08/27/13 0445  AST 24 23 16   ALT 31 21 15   ALKPHOS 90 87 70  BILITOT 1.2 1.2 0.7  PROT 7.7 6.4 6.1  ALBUMIN 3.2* 2.5* 2.1*    Recent Labs Lab 08/25/13 1008    LIPASE 13   No results found for this basename: AMMONIA,  in the last 168 hours  Coags:  Recent Labs Lab 08/22/13 1100 08/25/13 1008 08/26/13 0055 08/27/13 0445  INR 1.14 1.49 1.60* 1.51*   No results found for this basename: PTT,  in the last 168 hours  CBC:  Recent Labs Lab 08/25/13 1008 08/25/13 1930 08/26/13 0055 08/27/13 0445  WBC 21.9* 24.9* 24.9* 18.5*  HGB 14.8 12.7 11.1* 10.2*  HCT 44.3 38.7 33.4* 31.7*  MCV 105.5* 109.3* 106.0* 106.7*  PLT 268 279 232 233   CBG: No results found for this basename: GLUCAP,  in the last 168 hours  Recent Results (from the past 240 hour(s))  ANAEROBIC CULTURE     Status: None   Collection Time    08/22/13  1:41 PM      Result Value Ref Range Status   Specimen Description LEG LEFT BONE CHIP   Final   Special Requests     Final   Value: PATIENT ON FOLLOWING CIPRO 400 MG PRIOR TO CASE STARTING   Gram Stain     Final   Value: NO WBC SEEN     NO SQUAMOUS EPITHELIAL CELLS SEEN     NO ORGANISMS SEEN     Performed at [facility]   Culture     Final   Value: NO ANAEROBES ISOLATED; CULTURE IN PROGRESS FOR 5 DAYS     Performed at [facility]   Report Status PENDING   Incomplete  TISSUE CULTURE     Status: None   Collection Time    08/22/13  1:41 PM      Result Value Ref Range Status   Specimen Description LEG LEFT BONE CHIP   Final   Special Requests     Final   Value: PATIENT ON FOLLOWING CIPRO 400 MG PRIOR TO CASE STARTING   Gram Stain     Final   Value: NO WBC SEEN     NO SQUAMOUS EPITHELIAL CELLS SEEN     NO ORGANISMS SEEN     Performed at [facility]   Culture     Final   Value: FEW STAPHYLOCOCCUS AUREUS     Note: RIFAMPIN AND GENTAMICIN SHOULD NOT BE USED AS SINGLE DRUGS FOR TREATMENT OF STAPH INFECTIONS.     Performed at [facility]   Report Status 08/25/2013 FINAL   Final   Organism ID, Bacteria STAPHYLOCOCCUS AUREUS   Final  CULTURE, BLOOD (ROUTINE X 2)     Status: None    Collection Time    08/25/13  3:05 PM      Result Value Ref Range Status   Specimen Description BLOOD LEFT FOREARM   Final   Special Requests BOTTLES DRAWN AEROBIC ONLY 5CC   Final   Culture  Setup Time     Final   Value: 08/25/2013 18:56     Performed at [facility]  Final   Value:        BLOOD CULTURE RECEIVED NO GROWTH TO DATE CULTURE WILL BE HELD FOR 5 DAYS BEFORE ISSUING A FINAL NEGATIVE REPORT     Performed at [facility]   Report Status PENDING   Incomplete  CULTURE, BLOOD (ROUTINE X 2)     Status: None   Collection Time    08/25/13  3:45 PM      Result Value Ref Range Status   Specimen Description BLOOD LEFT FOREARM   Final   Special Requests BOTTLES DRAWN AEROBIC ONLY 5CC   Final   Culture  Setup Time     Final   Value: 08/25/2013 20:08     Performed at [facility]   Culture     Final   Value:        BLOOD CULTURE RECEIVED NO GROWTH TO DATE CULTURE WILL BE HELD FOR 5 DAYS BEFORE ISSUING A FINAL NEGATIVE REPORT     Performed at [facility]   Report Status PENDING   Incomplete  MRSA PCR SCREENING     Status: None   Collection Time    08/25/13  6:02 PM      Result Value Ref Range Status   MRSA by PCR NEGATIVE  NEGATIVE Final   Comment:            The GeneXpert MRSA Assay (FDA     approved for NASAL specimens     only), is one component of a     comprehensive MRSA colonization     surveillance program. It is not     intended to diagnose MRSA     infection nor to guide or     monitor treatment for     MRSA infections.  SURGICAL PCR SCREEN     Status: None   Collection Time    08/26/13 10:27 AM      Result Value Ref Range Status   MRSA, PCR NEGATIVE  NEGATIVE Final   Staphylococcus aureus NEGATIVE  NEGATIVE Final   Comment:            The Xpert SA Assay (FDA     approved for NASAL specimens     in patients over 14 years of age),     is one component of     a comprehensive surveillance     program.  Test performance  has     been validated by [employer] for patients greater     than or equal to 68 year old.     It is not intended     to diagnose infection nor to     guide or monitor treatment.  CULTURE, ROUTINE-ABSCESS     Status: None   Collection Time    08/26/13  2:04 PM      Result Value Ref Range Status   Specimen Description BUTTOCKS LEFT   Final   Special Requests LEFT UPPER BUTTOCK JP DRAIN   Final   Gram Stain     Final   Value: ABUNDANT WBC PRESENT,BOTH PMN AND MONONUCLEAR     NO SQUAMOUS EPITHELIAL CELLS SEEN     ABUNDANT GRAM POSITIVE RODS     ABUNDANT GRAM NEGATIVE RODS     FEW GRAM POSITIVE COCCI   Culture     Final   Value: Culture reincubated for better growth     Performed at [facility]   Report Status PENDING  Incomplete  ANAEROBIC CULTURE     Status: None   Collection Time    08/26/13  2:04 PM      Result Value Ref Range Status   Specimen Description ABSCESS BUTTOCKS LEFT   Final   Special Requests LEFT UPPER BUTTOCK JP DRAIN   Final   Gram Stain     Final   Value: ABUNDANT WBC PRESENT,BOTH PMN AND MONONUCLEAR     NO SQUAMOUS EPITHELIAL CELLS SEEN     ABUNDANT GRAM POSITIVE RODS     ABUNDANT GRAM NEGATIVE RODS     FEW GRAM POSITIVE COCCI   Culture PENDING   Incomplete   Report Status PENDING   Incomplete     Studies:  Recent x-ray studies have been reviewed in detail by the Attending Physician  Scheduled Meds:  Scheduled Meds: . ertapenem  1 g Intravenous Q24H  . levothyroxine  25 mcg Oral QAC breakfast  . metoprolol succinate  50 mg Oral BID  . sodium chloride  10-40 mL Intracatheter Q12H    Time spent on care of this patient: 35 mins   MCCLUNG,JEFFREY T , MD   [facility] Office  [phone] Pager - Text Page per Shea Evans as per below:  On-Call/Text Page:      [email or web address]      password [email or web address]  If 7PM-7AM, please contact night-coverage [email or web address] Password [email or web address] 08/27/2013, 9:25 AM   LOS: 2 days

## 2013-08-27 NOTE — Progress Notes (Signed)
[place] for Heparin Indication: atrial fibrillation  Allergies  Allergen Reactions  . Atenolol Nausea And Vomiting  . Clarithromycin Nausea And Vomiting  . Codeine Sulfate Nausea Only  . Macrodantin Other (See Comments)    Long time ago - reaction unknown  . Percocet [Oxycodone-Acetaminophen] Nausea And Vomiting    Patient Measurements: Height: 5\' 3"  (160 cm) Weight: 187 lb 2.7 oz (84.9 kg) IBW/kg (Calculated) : 52.4 Heparin Dosing Weight: 70 kg  Vital Signs: Temp: 99.3 F (37.4 C) (08/08 0025) Temp src: Oral (08/08 0025) BP: 104/47 mmHg (08/08 0600) Pulse Rate: 75 (08/08 0600)  Labs:  Recent Labs  08/25/13 1008 08/25/13 1930 08/26/13 0055 08/27/13 0445  HGB 14.8 12.7 11.1* 10.2*  HCT 44.3 38.7 33.4* 31.7*  PLT 268 279 232 233  LABPROT 18.0*  --  19.1* 18.2*  INR 1.49  --  1.60* 1.51*  HEPARINUNFRC  --   --  <0.10*  --   CREATININE 0.82 0.81 0.78 0.88    Estimated Creatinine Clearance: 48.3 ml/min (by C-G formula based on Cr of 0.88).  Assessment: 80 yo female with Afib, Coumadin on hold since NPO for perc drain placement on 8/7. Now to restart heparin. H/H has trended down (hgb 12.7>10.2), plt wnl and stable, no reported s/s bleeding.    Goal of Therapy:  Heparin level 0.3-0.7 units/ml Monitor platelets by anticoagulation protocol: Yes   Plan:  - Restart heparin at 1200 u/hr (previously undetectable at 1000 u/hr) - HL in 8 hours, then daily - Monitor CBC and s/s bleeding closely   Harolyn Rutherford, PharmD Clinical Pharmacist - Resident Pager: [phone] Pharmacy: [phone] 08/27/2013 8:02 AM

## 2013-08-27 NOTE — Progress Notes (Signed)
Subjective: Pt still c/o pelvic pain, R>L; denies N/V; on liquids; no recent BM  Objective: Vital signs in last 24 hours: Temp:  [98.6 F (37 C)-99.3 F (37.4 C)] 99.3 F (37.4 C) (08/08 0025) Pulse Rate:  [75-107] 77 (08/08 0803) Resp:  [14-29] 16 (08/08 0803) BP: (96-129)/(41-86) 108/73 mmHg (08/08 0803) SpO2:  [92 %-100 %] 97 % (08/08 0803) Last BM Date: 08/25/13 (PTA)  Intake/Output from previous day: 08/07 0701 - 08/08 0700 In: 2843.3 [P.O.:300; I.V.:2478.3; IV Piggyback:50] Out: 1532 [Urine:1475; Drains:57] Intake/Output this shift: Total I/O In: 150 [I.V.:150] Out: -   Left TG drain intact, insertion site ok, mildly tender, output 60 cc's yellow fluid; cx's pending; mod R>L pelvic tenderness; WBC down to 18.5 (24.9)  Lab Results:   Recent Labs  08/26/13 0055 08/27/13 0445  WBC 24.9* 18.5*  HGB 11.1* 10.2*  HCT 33.4* 31.7*  PLT 232 233   BMET  Recent Labs  08/26/13 0055 08/27/13 0445  NA 136* 137  K 4.0 3.5*  CL 98 102  CO2 24 25  GLUCOSE 121* 129*  BUN 10 14  CREATININE 0.78 0.88  CALCIUM 7.9* 8.1*   PT/INR  Recent Labs  08/26/13 0055 08/27/13 0445  LABPROT 19.1* 18.2*  INR 1.60* 1.51*   ABG No results found for this basename: PHART, PCO2, PO2, HCO3,  in the last 72 hours  Studies/Results: Ct Abdomen Pelvis W Contrast  08/25/2013   CLINICAL DATA:  Right-sided abdominal pain.  EXAM: CT ABDOMEN AND PELVIS WITH CONTRAST  TECHNIQUE: Multidetector CT imaging of the abdomen and pelvis was performed using the standard protocol following bolus administration of intravenous contrast.  CONTRAST:  5mL OMNIPAQUE IOHEXOL 300 MG/ML  SOLN  COMPARISON:  None.  FINDINGS: There are 2 adjacent loops of thick-walled small bowel in the right anterior pelvis, 1 having a maximum wall thickness of 6 mm and the other 5 mm. There is inflammatory type stranding adjacent to these loops. In addition, a fluid collection containing small bubbles of air tracks along the  posterior inferior margin of the more anterior loop. Date tubular fluid collection tracks along the inferior margin of the more inferior and posterior loop.  There is a larger extraluminal fluid collection in the inferior pelvis posterior to the lower uterine segment and to the left of the rectosigmoid. It measures 5.2 cm x 3 cm x 2.2 cm in size. It contain small bubbles of air. This is contiguous with the margin of the rectosigmoid, which may be the origin of this extraluminal collection. There are multiple diverticula noted along the sigmoid colon. This collection may be from a ruptured diverticulum.  There is a small round fluid collection in the right lower quadrant along the posterior peritoneal margin measuring 2 cm in greatest dimension. No air is seen within this collection.  No appendix is seen. However, the findings appear remote from the cecal tip that are most likely not the consequence of acute appendicitis.  There is subsegmental atelectasis at the lung bases. Small moderate hiatal hernia. Heart is mildly enlarged.  Liver, spleen and gallbladder are unremarkable. No bile duct dilation.  There is dilation of the pancreatic duct, maximum of 8 mm in the pancreatic head. This is likely chronic. No pancreatic mass or inflammatory change.  Normal adrenal glands. Kidneys show mild cortical thinning. There is small bilateral low-density renal lesions that are likely cysts. No hydronephrosis. Ureters are unremarkable. No bladder mass or stone.  Uterus and adnexa are unremarkable other than the  adjacent inflammatory changes described above. This  No generalized ascites.  No nondependent free intraperitoneal air.  There are degenerative changes throughout the visualized spine. Bones are demineralized. The right hip prosthesis is well-seated and aligned. There is a mild compression/ burst fracture of L4 that is old. Intervertebral cages have been placed at L3-L4-L4-L5.  IMPRESSION: 1. There are inflammatory  changes in the pelvis and right lower quadrant. 2. Two adjacent loops of small bowel in the anterior right pelvis show significant wall thickening, which may be the source of the inflammatory changes-- an infectious or inflammatory ileitis. The wall thickening could potentially be reactive. There are small adjacent extraluminal fluid collections containing small bubbles of air. A larger extraluminal fluid collection lies along the left margin of the rectosigmoid junction. Findings could be due to ruptured diverticulitis and peridiverticular abscess, with secondary reactive edema/inflammation of the loops of small bowel. 3. No appendix is visualized. Ruptured appendix is unlikely but not excluded-- the findings are not centered on the expected location of the appendix. 4. No other acute findings.   Electronically Signed   By: Lajean Manes M.D.   On: 08/25/2013 13:19   Dg Chest Port 1 View  08/26/2013   ADDENDUM REPORT: 08/26/2013 16:35  ADDENDUM: The PICC could be retracted 2 cm to place the tip at the cavoatrial junction.   Electronically Signed   By: Logan Bores   On: 08/26/2013 16:35   08/26/2013   CLINICAL DATA:  Line placement.  EXAM: PORTABLE CHEST - 1 VIEW  COMPARISON:  08/25/2013  FINDINGS: There has been interval placement of a right PICC line with tip projecting over the mid to upper right atrium. The cardiac silhouette remains mildly enlarged. The lungs are hypoinflated. Linear opacities in the right midlung is unchanged and may reflect atelectasis or scarring. There is slightly increased prominence of the pulmonary vasculature. No new airspace consolidation, pleural effusion, or pneumothorax is identified. No acute osseous abnormality is seen.  IMPRESSION: Right PICC terminating over the mid to upper right atrium.  Electronically Signed: By: Logan Bores On: 08/26/2013 15:46   Dg Chest Portable 1 View  08/25/2013   CLINICAL DATA:  Chest pain, abdominal pain  EXAM: PORTABLE CHEST - 1 VIEW   COMPARISON:  06/16/2013  FINDINGS: Cardiomegaly is noted. Mild elevation of the right hemidiaphragm. No acute infiltrate or pulmonary edema. Stable atelectasis or scarring in right midlung.  IMPRESSION: Cardiomegaly. Chronic elevation of the right hemidiaphragm. No active disease. Stable atelectasis or scarring right midlung.   Electronically Signed   By: Lahoma Crocker M.D.   On: 08/25/2013 09:54   Ct Image Guided Drainage By Percutaneous Catheter  08/26/2013   CLINICAL DATA:  Pelvic diverticular abscess  EXAM: CT GUIDED DRAINAGE OF PELVIC DIVERTICULAR ABSCESS  ANESTHESIA/SEDATION: 1.5 Mg IV Versed 50 mcg IV Fentanyl  Total Moderate Sedation Time:  21 minutes  PROCEDURE: The procedure, risks, benefits, and alternatives were explained to the patient. Questions regarding the procedure were encouraged and answered. The patient understands and consents to the procedure.  The left gluteal area was prepped with Betadinein a sterile fashion, and a sterile drape was applied covering the operative field. A sterile gown and sterile gloves were used for the procedure. Local anesthesia was provided with 1% Lidocaine.  Previous imaging reviewed. Patient positioned prone. Noncontrast localization CT performed. The posterior left pelvic abscess was localized. Under sterile conditions and local anesthesia, an 18 gauge 10 cm access needle was advanced percutaneously from a left trans  gluteal approach into the collection. Needle position confirmed with CT. Syringe aspiration yielded fecal contaminated fluid. Guidewire inserted followed by tract dilatation to insert a 10 [place] drain. Drain catheter confirmed in the abscess cavity. 25 cc fecal contaminated fluid aspirated. Catheter secured with a Prolene suture and connected to external suction bulb. Sterile dressing applied. No immediate complication. Patient tolerated the procedure well.  COMPLICATIONS: No immediate  FINDINGS: Imaging confirms needle placement in the left pelvic  diverticular abscess for drain placement  IMPRESSION: Successful CT-guided left pelvic diverticular abscess drain insertion. Sample sent for Gram stain culture.   Electronically Signed   By: Daryll Brod M.D.   On: 08/26/2013 14:12    Anti-infectives: Anti-infectives   Start     Dose/Rate Route Frequency Ordered Stop   08/26/13 0800  ertapenem (INVANZ) 1 g in sodium chloride 0.9 % 50 mL IVPB     1 g 100 mL/hr over 30 Minutes Intravenous Every 24 hours 08/26/13 0747     08/26/13 0200  ciprofloxacin (CIPRO) IVPB 400 mg  Status:  Discontinued     400 mg 200 mL/hr over 60 Minutes Intravenous Every 12 hours 08/25/13 1646 08/26/13 0747   08/25/13 1645  metroNIDAZOLE (FLAGYL) IVPB 500 mg  Status:  Discontinued     500 mg 100 mL/hr over 60 Minutes Intravenous Every 8 hours 08/25/13 1636 08/26/13 0747   08/25/13 1345  ciprofloxacin (CIPRO) IVPB 400 mg     400 mg 200 mL/hr over 60 Minutes Intravenous  Once 08/25/13 1344 08/25/13 1515   08/25/13 1345  metroNIDAZOLE (FLAGYL) IVPB 500 mg     500 mg 100 mL/hr over 60 Minutes Intravenous  Once 08/25/13 1344 08/25/13 1554      Assessment/Plan: s/p left pelvic/diverticular abscess drainage 8/7; cont drain irrigation, check final cx's; monitor labs; if pelvic pain worsens would obtain f/u CT to reassess additional fluid collections  LOS: 2 days    ALLRED,D [facility] 08/27/2013

## 2013-08-27 NOTE — Progress Notes (Signed)
Agree with above, complicated diverticulitis, has been on invaz, drain yesterday, wbc down, no flatus, ab pain really unchanged, no urgent need for surgery, hopefully will continue to improve, discussed plan with patient and family today

## 2013-08-27 NOTE — Progress Notes (Signed)
Patient ID: [name], female   DOB: 06-Feb-1925, 80 y.o.   MRN: [id number]     [facility]      [address] [place], [place] [address]    Phone: [phone] FAX: [phone]     Subjective: Perhaps a bit better pain today.  White count trending down.  Fevers improved.   80ml of drain output since placement.    Objective:  Vital signs:  Filed Vitals:   08/26/13 2000 08/27/13 0025 08/27/13 0400 08/27/13 0600  BP: 114/55 96/41 100/43 104/47  Pulse: 83 76 77 75  Temp: 98.7 F (37.1 C) 99.3 F (37.4 C)    TempSrc: Oral Oral    Resp:  $Remo'19 15 16  'aiIPl$ Height:      Weight:      SpO2: 95% 95% 96% 97%    Last BM Date: 08/25/13 (PTA)  Intake/Output   Yesterday:  08/07 0701 - 08/08 0700 In: 2843.3 [P.O.:300; I.V.:2478.3; IV Piggyback:50] Out: 1532 [NHAFB:9038; Drains:57] This shift:    I/O last 3 completed shifts: In: 4752.4 [P.O.:300; I.V.:3987.4; Other:15; IV Piggyback:450] Out: 3338 [VANVB:1660; Drains:57]    Physical Exam:  General: Pt awake/alert/oriented x4 in no acute distress  Abdomen: Soft. Nondistended. Moderate TTP  right greater than left. No evidence of peritonitis. No incarcerated hernias.  Gluteal drain with serous, cloudy output  Problem List:   Principal Problem:   Sepsis Active Problems:   HYPOTHYROIDISM   Iron deficiency anemia, unspecified   HYPERTENSION   Atrial flutter   Unspecified constipation   Metabolic acidosis   Diverticulitis of colon with perforation    Results:   Labs: Results for orders placed during the hospital encounter of 08/25/13 (from the past 48 hour(s))  CBC     Status: Abnormal   Collection Time    08/25/13 10:08 AM      Result Value Ref Range   WBC 21.9 (*) 4.0 - 10.5 K/uL   RBC 4.20  3.87 - 5.11 MIL/uL   Hemoglobin 14.8  12.0 - 15.0 g/dL   HCT 44.3  36.0 - 46.0 %   MCV 105.5 (*) 78.0 - 100.0 fL   MCH 35.2 (*) 26.0 - 34.0 pg   MCHC 33.4  30.0 - 36.0 g/dL   RDW  12.9  11.5 - 15.5 %   Platelets 268  150 - 400 K/uL  COMPREHENSIVE METABOLIC PANEL     Status: Abnormal   Collection Time    08/25/13 10:08 AM      Result Value Ref Range   Sodium 140  137 - 147 mEq/L   Potassium 3.6 (*) 3.7 - 5.3 mEq/L   Chloride 95 (*) 96 - 112 mEq/L   CO2 29  19 - 32 mEq/L   Glucose, Bld 123 (*) 70 - 99 mg/dL   BUN 13  6 - 23 mg/dL   Creatinine, Ser 0.82  0.50 - 1.10 mg/dL   Calcium 8.8  8.4 - 10.5 mg/dL   Total Protein 7.7  6.0 - 8.3 g/dL   Albumin 3.2 (*) 3.5 - 5.2 g/dL   AST 24  0 - 37 U/L   ALT 31  0 - 35 U/L   Alkaline Phosphatase 90  39 - 117 U/L   Total Bilirubin 1.2  0.3 - 1.2 mg/dL   GFR calc non Af Amer 63 (*) >90 mL/min   GFR calc Af Amer 74 (*) >90 mL/min   Comment: (NOTE)  The eGFR has been calculated using the CKD EPI equation.     This calculation has not been validated in all clinical situations.     eGFR's persistently <90 mL/min signify possible Chronic Kidney     Disease.   Anion gap 16 (*) 5 - 15  LIPASE, BLOOD     Status: None   Collection Time    08/25/13 10:08 AM      Result Value Ref Range   Lipase 13  11 - 59 U/L  PROTIME-INR     Status: Abnormal   Collection Time    08/25/13 10:08 AM      Result Value Ref Range   Prothrombin Time 18.0 (*) 11.6 - 15.2 seconds   INR 1.49  0.00 - 1.49  I-STAT TROPOININ, ED     Status: None   Collection Time    08/25/13 10:15 AM      Result Value Ref Range   Troponin i, poc 0.00  0.00 - 0.08 ng/mL   Comment 3            Comment: Due to the release kinetics of cTnI,     a negative result within the first hours     of the onset of symptoms does not rule out     myocardial infarction with certainty.     If myocardial infarction is still suspected,     repeat the test at appropriate intervals.  URINALYSIS, ROUTINE W REFLEX MICROSCOPIC     Status: Abnormal   Collection Time    08/25/13 11:06 AM      Result Value Ref Range   Color, Urine YELLOW  YELLOW   APPearance HAZY (*) CLEAR    Specific Gravity, Urine 1.019  1.005 - 1.030   pH 7.0  5.0 - 8.0   Glucose, UA NEGATIVE  NEGATIVE mg/dL   Hgb urine dipstick SMALL (*) NEGATIVE   Bilirubin Urine NEGATIVE  NEGATIVE   Ketones, ur NEGATIVE  NEGATIVE mg/dL   Protein, ur 30 (*) NEGATIVE mg/dL   Urobilinogen, UA 0.2  0.0 - 1.0 mg/dL   Nitrite NEGATIVE  NEGATIVE   Leukocytes, UA SMALL (*) NEGATIVE  URINE MICROSCOPIC-ADD ON     Status: Abnormal   Collection Time    08/25/13 11:06 AM      Result Value Ref Range   Squamous Epithelial / LPF FEW (*) RARE   WBC, UA 3-6  <3 WBC/hpf   RBC / HPF 0-2  <3 RBC/hpf   Bacteria, UA MANY (*) RARE  I-STAT CG4 LACTIC ACID, ED     Status: Abnormal   Collection Time    08/25/13 11:25 AM      Result Value Ref Range   Lactic Acid, Venous 3.76 (*) 0.5 - 2.2 mmol/L  CULTURE, BLOOD (ROUTINE X 2)     Status: None   Collection Time    08/25/13  3:05 PM      Result Value Ref Range   Specimen Description BLOOD LEFT FOREARM     Special Requests BOTTLES DRAWN AEROBIC ONLY 5CC     Culture  Setup Time       Value: 08/25/2013 18:56     Performed at [facility]   Culture       Value:        BLOOD CULTURE RECEIVED NO GROWTH TO DATE CULTURE WILL BE HELD FOR 5 DAYS BEFORE ISSUING A FINAL NEGATIVE REPORT     Performed at [facility]  Report Status PENDING    CULTURE, BLOOD (ROUTINE X 2)     Status: None   Collection Time    08/25/13  3:45 PM      Result Value Ref Range   Specimen Description BLOOD LEFT FOREARM     Special Requests BOTTLES DRAWN AEROBIC ONLY 5CC     Culture  Setup Time       Value: 08/25/2013 20:08     Performed at [facility]   Culture       Value:        BLOOD CULTURE RECEIVED NO GROWTH TO DATE CULTURE WILL BE HELD FOR 5 DAYS BEFORE ISSUING A FINAL NEGATIVE REPORT     Performed at [facility]   Report Status PENDING    MRSA PCR SCREENING     Status: None   Collection Time    08/25/13  6:02 PM      Result Value Ref Range   MRSA by PCR  NEGATIVE  NEGATIVE   Comment:            The GeneXpert MRSA Assay (FDA     approved for NASAL specimens     only), is one component of a     comprehensive MRSA colonization     surveillance program. It is not     intended to diagnose MRSA     infection nor to guide or     monitor treatment for     MRSA infections.  BASIC METABOLIC PANEL     Status: Abnormal   Collection Time    08/25/13  7:30 PM      Result Value Ref Range   Sodium 138  137 - 147 mEq/L   Potassium 3.2 (*) 3.7 - 5.3 mEq/L   Chloride 98  96 - 112 mEq/L   CO2 26  19 - 32 mEq/L   Glucose, Bld 111 (*) 70 - 99 mg/dL   BUN 11  6 - 23 mg/dL   Creatinine, Ser 0.81  0.50 - 1.10 mg/dL   Calcium 8.1 (*) 8.4 - 10.5 mg/dL   GFR calc non Af Amer 64 (*) >90 mL/min   GFR calc Af Amer 75 (*) >90 mL/min   Comment: (NOTE)     The eGFR has been calculated using the CKD EPI equation.     This calculation has not been validated in all clinical situations.     eGFR's persistently <90 mL/min signify possible Chronic Kidney     Disease.   Anion gap 14  5 - 15  CBC     Status: Abnormal   Collection Time    08/25/13  7:30 PM      Result Value Ref Range   WBC 24.9 (*) 4.0 - 10.5 K/uL   RBC 3.54 (*) 3.87 - 5.11 MIL/uL   Hemoglobin 12.7  12.0 - 15.0 g/dL   HCT 38.7  36.0 - 46.0 %   MCV 109.3 (*) 78.0 - 100.0 fL   MCH 35.9 (*) 26.0 - 34.0 pg   MCHC 32.8  30.0 - 36.0 g/dL   RDW 12.9  11.5 - 15.5 %   Platelets 279  150 - 400 K/uL  PROTIME-INR     Status: Abnormal   Collection Time    08/26/13 12:55 AM      Result Value Ref Range   Prothrombin Time 19.1 (*) 11.6 - 15.2 seconds   INR 1.60 (*) 0.00 - 1.49  HEPARIN LEVEL (UNFRACTIONATED)  Status: Abnormal   Collection Time    08/26/13 12:55 AM      Result Value Ref Range   Heparin Unfractionated <0.10 (*) 0.30 - 0.70 IU/mL   Comment:            IF HEPARIN RESULTS ARE BELOW     EXPECTED VALUES, AND PATIENT     DOSAGE HAS BEEN CONFIRMED,     SUGGEST FOLLOW UP TESTING     OF  ANTITHROMBIN III LEVELS.  CBC     Status: Abnormal   Collection Time    08/26/13 12:55 AM      Result Value Ref Range   WBC 24.9 (*) 4.0 - 10.5 K/uL   RBC 3.15 (*) 3.87 - 5.11 MIL/uL   Hemoglobin 11.1 (*) 12.0 - 15.0 g/dL   HCT 33.4 (*) 36.0 - 46.0 %   MCV 106.0 (*) 78.0 - 100.0 fL   MCH 35.2 (*) 26.0 - 34.0 pg   MCHC 33.2  30.0 - 36.0 g/dL   RDW 13.1  11.5 - 15.5 %   Platelets 232  150 - 400 K/uL  COMPREHENSIVE METABOLIC PANEL     Status: Abnormal   Collection Time    08/26/13 12:55 AM      Result Value Ref Range   Sodium 136 (*) 137 - 147 mEq/L   Potassium 4.0  3.7 - 5.3 mEq/L   Comment: DELTA CHECK NOTED     HEMOLYZED SPECIMEN, RESULTS MAY BE AFFECTED   Chloride 98  96 - 112 mEq/L   CO2 24  19 - 32 mEq/L   Glucose, Bld 121 (*) 70 - 99 mg/dL   BUN 10  6 - 23 mg/dL   Creatinine, Ser 0.78  0.50 - 1.10 mg/dL   Calcium 7.9 (*) 8.4 - 10.5 mg/dL   Total Protein 6.4  6.0 - 8.3 g/dL   Albumin 2.5 (*) 3.5 - 5.2 g/dL   AST 23  0 - 37 U/L   ALT 21  0 - 35 U/L   Alkaline Phosphatase 87  39 - 117 U/L   Total Bilirubin 1.2  0.3 - 1.2 mg/dL   GFR calc non Af Amer 74 (*) >90 mL/min   GFR calc Af Amer 86 (*) >90 mL/min   Comment: (NOTE)     The eGFR has been calculated using the CKD EPI equation.     This calculation has not been validated in all clinical situations.     eGFR's persistently <90 mL/min signify possible Chronic Kidney     Disease.   Anion gap 14  5 - 15  SURGICAL PCR SCREEN     Status: None   Collection Time    08/26/13 10:27 AM      Result Value Ref Range   MRSA, PCR NEGATIVE  NEGATIVE   Staphylococcus aureus NEGATIVE  NEGATIVE   Comment:            The Xpert SA Assay (FDA     approved for NASAL specimens     in patients over 44 years of age),     is one component of     a comprehensive surveillance     program.  Test performance has     been validated by [employer] for patients greater     than or equal to 19 year old.     It is not intended     to  diagnose infection nor to  guide or monitor treatment.  CULTURE, ROUTINE-ABSCESS     Status: None   Collection Time    08/26/13  2:04 PM      Result Value Ref Range   Specimen Description BUTTOCKS LEFT     Special Requests LEFT UPPER BUTTOCK JP DRAIN     Gram Stain       Value: ABUNDANT WBC PRESENT,BOTH PMN AND MONONUCLEAR     NO SQUAMOUS EPITHELIAL CELLS SEEN     ABUNDANT GRAM POSITIVE RODS     ABUNDANT GRAM NEGATIVE RODS     FEW GRAM POSITIVE COCCI   Culture       Value: Culture reincubated for better growth     Performed at [facility]   Report Status PENDING    ANAEROBIC CULTURE     Status: None   Collection Time    08/26/13  2:04 PM      Result Value Ref Range   Specimen Description ABSCESS BUTTOCKS LEFT     Special Requests LEFT UPPER BUTTOCK JP DRAIN     Gram Stain       Value: ABUNDANT WBC PRESENT,BOTH PMN AND MONONUCLEAR     NO SQUAMOUS EPITHELIAL CELLS SEEN     ABUNDANT GRAM POSITIVE RODS     ABUNDANT GRAM NEGATIVE RODS     FEW GRAM POSITIVE COCCI   Culture PENDING     Report Status PENDING    PROTIME-INR     Status: Abnormal   Collection Time    08/27/13  4:45 AM      Result Value Ref Range   Prothrombin Time 18.2 (*) 11.6 - 15.2 seconds   INR 1.51 (*) 0.00 - 1.49  CBC     Status: Abnormal   Collection Time    08/27/13  4:45 AM      Result Value Ref Range   WBC 18.5 (*) 4.0 - 10.5 K/uL   RBC 2.97 (*) 3.87 - 5.11 MIL/uL   Hemoglobin 10.2 (*) 12.0 - 15.0 g/dL   HCT 31.7 (*) 36.0 - 46.0 %   MCV 106.7 (*) 78.0 - 100.0 fL   MCH 34.3 (*) 26.0 - 34.0 pg   MCHC 32.2  30.0 - 36.0 g/dL   RDW 13.1  11.5 - 15.5 %   Platelets 233  150 - 400 K/uL  COMPREHENSIVE METABOLIC PANEL     Status: Abnormal   Collection Time    08/27/13  4:45 AM      Result Value Ref Range   Sodium 137  137 - 147 mEq/L   Potassium 3.5 (*) 3.7 - 5.3 mEq/L   Chloride 102  96 - 112 mEq/L   CO2 25  19 - 32 mEq/L   Glucose, Bld 129 (*) 70 - 99 mg/dL   BUN 14  6 - 23 mg/dL    Creatinine, Ser 0.88  0.50 - 1.10 mg/dL   Calcium 8.1 (*) 8.4 - 10.5 mg/dL   Total Protein 6.1  6.0 - 8.3 g/dL   Albumin 2.1 (*) 3.5 - 5.2 g/dL   AST 16  0 - 37 U/L   ALT 15  0 - 35 U/L   Alkaline Phosphatase 70  39 - 117 U/L   Total Bilirubin 0.7  0.3 - 1.2 mg/dL   GFR calc non Af Amer 58 (*) >90 mL/min   GFR calc Af Amer 68 (*) >90 mL/min   Comment: (NOTE)     The eGFR has been calculated using the CKD EPI  equation.     This calculation has not been validated in all clinical situations.     eGFR's persistently <90 mL/min signify possible Chronic Kidney     Disease.   Anion gap 10  5 - 15  RETICULOCYTES     Status: Abnormal   Collection Time    08/27/13  4:45 AM      Result Value Ref Range   Retic Ct Pct 1.9  0.4 - 3.1 %   RBC. 2.97 (*) 3.87 - 5.11 MIL/uL   Retic Count, Manual 56.4  19.0 - 186.0 K/uL  LACTIC ACID, PLASMA     Status: None   Collection Time    08/27/13  4:48 AM      Result Value Ref Range   Lactic Acid, Venous 0.8  0.5 - 2.2 mmol/L    Imaging / Studies: Ct Abdomen Pelvis W Contrast  08/25/2013   CLINICAL DATA:  Right-sided abdominal pain.  EXAM: CT ABDOMEN AND PELVIS WITH CONTRAST  TECHNIQUE: Multidetector CT imaging of the abdomen and pelvis was performed using the standard protocol following bolus administration of intravenous contrast.  CONTRAST:  32mL OMNIPAQUE IOHEXOL 300 MG/ML  SOLN  COMPARISON:  None.  FINDINGS: There are 2 adjacent loops of thick-walled small bowel in the right anterior pelvis, 1 having a maximum wall thickness of 6 mm and the other 5 mm. There is inflammatory type stranding adjacent to these loops. In addition, a fluid collection containing small bubbles of air tracks along the posterior inferior margin of the more anterior loop. Date tubular fluid collection tracks along the inferior margin of the more inferior and posterior loop.  There is a larger extraluminal fluid collection in the inferior pelvis posterior to the lower uterine segment and  to the left of the rectosigmoid. It measures 5.2 cm x 3 cm x 2.2 cm in size. It contain small bubbles of air. This is contiguous with the margin of the rectosigmoid, which may be the origin of this extraluminal collection. There are multiple diverticula noted along the sigmoid colon. This collection may be from a ruptured diverticulum.  There is a small round fluid collection in the right lower quadrant along the posterior peritoneal margin measuring 2 cm in greatest dimension. No air is seen within this collection.  No appendix is seen. However, the findings appear remote from the cecal tip that are most likely not the consequence of acute appendicitis.  There is subsegmental atelectasis at the lung bases. Small moderate hiatal hernia. Heart is mildly enlarged.  Liver, spleen and gallbladder are unremarkable. No bile duct dilation.  There is dilation of the pancreatic duct, maximum of 8 mm in the pancreatic head. This is likely chronic. No pancreatic mass or inflammatory change.  Normal adrenal glands. Kidneys show mild cortical thinning. There is small bilateral low-density renal lesions that are likely cysts. No hydronephrosis. Ureters are unremarkable. No bladder mass or stone.  Uterus and adnexa are unremarkable other than the adjacent inflammatory changes described above. This  No generalized ascites.  No nondependent free intraperitoneal air.  There are degenerative changes throughout the visualized spine. Bones are demineralized. The right hip prosthesis is well-seated and aligned. There is a mild compression/ burst fracture of L4 that is old. Intervertebral cages have been placed at L3-L4-L4-L5.  IMPRESSION: 1. There are inflammatory changes in the pelvis and right lower quadrant. 2. Two adjacent loops of small bowel in the anterior right pelvis show significant wall thickening, which may be the source of  the inflammatory changes-- an infectious or inflammatory ileitis. The wall thickening could potentially  be reactive. There are small adjacent extraluminal fluid collections containing small bubbles of air. A larger extraluminal fluid collection lies along the left margin of the rectosigmoid junction. Findings could be due to ruptured diverticulitis and peridiverticular abscess, with secondary reactive edema/inflammation of the loops of small bowel. 3. No appendix is visualized. Ruptured appendix is unlikely but not excluded-- the findings are not centered on the expected location of the appendix. 4. No other acute findings.   Electronically Signed   By: Lajean Manes M.D.   On: 08/25/2013 13:19   Dg Chest Port 1 View  08/26/2013   ADDENDUM REPORT: 08/26/2013 16:35  ADDENDUM: The PICC could be retracted 2 cm to place the tip at the cavoatrial junction.   Electronically Signed   By: Logan Bores   On: 08/26/2013 16:35   08/26/2013   CLINICAL DATA:  Line placement.  EXAM: PORTABLE CHEST - 1 VIEW  COMPARISON:  08/25/2013  FINDINGS: There has been interval placement of a right PICC line with tip projecting over the mid to upper right atrium. The cardiac silhouette remains mildly enlarged. The lungs are hypoinflated. Linear opacities in the right midlung is unchanged and may reflect atelectasis or scarring. There is slightly increased prominence of the pulmonary vasculature. No new airspace consolidation, pleural effusion, or pneumothorax is identified. No acute osseous abnormality is seen.  IMPRESSION: Right PICC terminating over the mid to upper right atrium.  Electronically Signed: By: Logan Bores On: 08/26/2013 15:46   Dg Chest Portable 1 View  08/25/2013   CLINICAL DATA:  Chest pain, abdominal pain  EXAM: PORTABLE CHEST - 1 VIEW  COMPARISON:  06/16/2013  FINDINGS: Cardiomegaly is noted. Mild elevation of the right hemidiaphragm. No acute infiltrate or pulmonary edema. Stable atelectasis or scarring in right midlung.  IMPRESSION: Cardiomegaly. Chronic elevation of the right hemidiaphragm. No active disease. Stable  atelectasis or scarring right midlung.   Electronically Signed   By: Lahoma Crocker M.D.   On: 08/25/2013 09:54   Ct Image Guided Drainage By Percutaneous Catheter  08/26/2013   CLINICAL DATA:  Pelvic diverticular abscess  EXAM: CT GUIDED DRAINAGE OF PELVIC DIVERTICULAR ABSCESS  ANESTHESIA/SEDATION: 1.5 Mg IV Versed 50 mcg IV Fentanyl  Total Moderate Sedation Time:  21 minutes  PROCEDURE: The procedure, risks, benefits, and alternatives were explained to the patient. Questions regarding the procedure were encouraged and answered. The patient understands and consents to the procedure.  The left gluteal area was prepped with Betadinein a sterile fashion, and a sterile drape was applied covering the operative field. A sterile gown and sterile gloves were used for the procedure. Local anesthesia was provided with 1% Lidocaine.  Previous imaging reviewed. Patient positioned prone. Noncontrast localization CT performed. The posterior left pelvic abscess was localized. Under sterile conditions and local anesthesia, an 18 gauge 10 cm access needle was advanced percutaneously from a left trans gluteal approach into the collection. Needle position confirmed with CT. Syringe aspiration yielded fecal contaminated fluid. Guidewire inserted followed by tract dilatation to insert a 10 [place] drain. Drain catheter confirmed in the abscess cavity. 25 cc fecal contaminated fluid aspirated. Catheter secured with a Prolene suture and connected to external suction bulb. Sterile dressing applied. No immediate complication. Patient tolerated the procedure well.  COMPLICATIONS: No immediate  FINDINGS: Imaging confirms needle placement in the left pelvic diverticular abscess for drain placement  IMPRESSION: Successful CT-guided left pelvic diverticular abscess  drain insertion. Sample sent for Gram stain culture.   Electronically Signed   By: Daryll Brod M.D.   On: 08/26/2013 14:12    Medications / Allergies:  Scheduled Meds: .  ertapenem  1 g Intravenous Q24H  . levothyroxine  25 mcg Oral QAC breakfast  . metoprolol succinate  50 mg Oral BID  . sodium chloride  10-40 mL Intracatheter Q12H   Continuous Infusions: . sodium chloride 100 mL/hr at 08/27/13 0611  . diltiazem (CARDIZEM) infusion 15 mg/hr (08/27/13 0315)   PRN Meds:.LORazepam, morphine injection, ondansetron (ZOFRAN) IV, ondansetron, sodium chloride  Antibiotics: Anti-infectives   Start     Dose/Rate Route Frequency Ordered Stop   08/26/13 0800  ertapenem (INVANZ) 1 g in sodium chloride 0.9 % 50 mL IVPB     1 g 100 mL/hr over 30 Minutes Intravenous Every 24 hours 08/26/13 0747     08/26/13 0200  ciprofloxacin (CIPRO) IVPB 400 mg  Status:  Discontinued     400 mg 200 mL/hr over 60 Minutes Intravenous Every 12 hours 08/25/13 1646 08/26/13 0747   08/25/13 1645  metroNIDAZOLE (FLAGYL) IVPB 500 mg  Status:  Discontinued     500 mg 100 mL/hr over 60 Minutes Intravenous Every 8 hours 08/25/13 1636 08/26/13 0747   08/25/13 1345  ciprofloxacin (CIPRO) IVPB 400 mg     400 mg 200 mL/hr over 60 Minutes Intravenous  Once 08/25/13 1344 08/25/13 1515   08/25/13 1345  metroNIDAZOLE (FLAGYL) IVPB 500 mg     500 mg 100 mL/hr over 60 Minutes Intravenous  Once 08/25/13 1344 08/25/13 1554      Assessment/Plan  (Hospital problem)  Atrial flutter-heparin/cardizem, heparin gtt off at 0800  HTN  Hypothyroidism  IDA  Chronic diastolic heart failure   (Principal problem)  Abdominal pain, RLQ inflammatory process  Diverticulitis with abscess  -s/p IR drain placement 8/7 -await cultures -c/w Invanz -continue with bowel rest today -IVF  -repeat CBC in AM -resume heparin per primary team   Erby Pian, [facility] Surgery Pager [phone] Office [phone]  08/27/2013 8:38 AM

## 2013-08-28 LAB — PROTIME-INR
INR: 1.49 (ref 0.00–1.49)
Prothrombin Time: 18 seconds — ABNORMAL HIGH (ref 11.6–15.2)

## 2013-08-28 LAB — BASIC METABOLIC PANEL
Anion gap: 13 (ref 5–15)
BUN: 13 mg/dL (ref 6–23)
CO2: 21 mEq/L (ref 19–32)
Calcium: 8.4 mg/dL (ref 8.4–10.5)
Chloride: 102 mEq/L (ref 96–112)
Creatinine, Ser: 0.77 mg/dL (ref 0.50–1.10)
GFR calc Af Amer: 86 mL/min — ABNORMAL LOW (ref >90)
GFR calc non Af Amer: 74 mL/min — ABNORMAL LOW (ref >90)
Glucose, Bld: 127 mg/dL — ABNORMAL HIGH (ref 70–99)
Potassium: 4.3 mEq/L (ref 3.7–5.3)
Sodium: 136 mEq/L — ABNORMAL LOW (ref 137–147)

## 2013-08-28 LAB — CBC
HCT: 32.8 % — ABNORMAL LOW (ref 36.0–46.0)
Hemoglobin: 10.5 g/dL — ABNORMAL LOW (ref 12.0–15.0)
MCH: 34.1 pg — ABNORMAL HIGH (ref 26.0–34.0)
MCHC: 32 g/dL (ref 30.0–36.0)
MCV: 106.5 fL — ABNORMAL HIGH (ref 78.0–100.0)
Platelets: 254 10*3/uL (ref 150–400)
RBC: 3.08 MIL/uL — ABNORMAL LOW (ref 3.87–5.11)
RDW: 13.1 % (ref 11.5–15.5)
WBC: 13.7 10*3/uL — ABNORMAL HIGH (ref 4.0–10.5)

## 2013-08-28 LAB — PHOSPHORUS: Phosphorus: 2.8 mg/dL (ref 2.3–4.6)

## 2013-08-28 LAB — MAGNESIUM: Magnesium: 2.1 mg/dL (ref 1.5–2.5)

## 2013-08-28 LAB — HEPARIN LEVEL (UNFRACTIONATED): Heparin Unfractionated: 0.41 IU/mL (ref 0.30–0.70)

## 2013-08-28 LAB — GLUCOSE, CAPILLARY
Glucose-Capillary: 132 mg/dL — ABNORMAL HIGH (ref 70–99)
Glucose-Capillary: 163 mg/dL — ABNORMAL HIGH (ref 70–99)

## 2013-08-28 MED ORDER — DEXTROSE-NACL 5-0.9 % IV SOLN: INTRAVENOUS | Status: DC

## 2013-08-28 MED ORDER — INSULIN ASPART 100 UNIT/ML ~~LOC~~ SOLN
0.0000 [IU] | Freq: Four times a day (QID) | SUBCUTANEOUS | Status: DC
  Administered 2013-08-28: 1 [IU] via SUBCUTANEOUS
  Administered 2013-08-28 – 2013-08-29 (×4): 2 [IU] via SUBCUTANEOUS
  Administered 2013-08-30: 1 [IU] via SUBCUTANEOUS
  Administered 2013-08-30 – 2013-09-01 (×5): 2 [IU] via SUBCUTANEOUS
  Administered 2013-09-01: 1 [IU] via SUBCUTANEOUS
  Administered 2013-09-01 (×2): 2 [IU] via SUBCUTANEOUS
  Administered 2013-09-01: 1 [IU] via SUBCUTANEOUS
  Administered 2013-09-02: 2 [IU] via SUBCUTANEOUS
  Administered 2013-09-02: 1 [IU] via SUBCUTANEOUS
  Administered 2013-09-02 – 2013-09-03 (×4): 2 [IU] via SUBCUTANEOUS
  Administered 2013-09-03: 3 [IU] via SUBCUTANEOUS
  Administered 2013-09-04 (×3): 1 [IU] via SUBCUTANEOUS
  Administered 2013-09-04: 2 [IU] via SUBCUTANEOUS

## 2013-08-28 MED ORDER — FAT EMULSION 20 % IV EMUL
240.0000 mL | INTRAVENOUS | Status: AC
Start: 2013-08-28 — End: 2013-08-29
  Administered 2013-08-28: 240 mL via INTRAVENOUS
  Filled 2013-08-28: qty 250

## 2013-08-28 MED ORDER — CLINIMIX E/DEXTROSE (5/15) 5 % IV SOLN: INTRAVENOUS | Status: DC

## 2013-08-28 MED ORDER — FAT EMULSION 20 % IV EMUL: 240.0000 mL | INTRAVENOUS | Status: DC

## 2013-08-28 MED ORDER — ENOXAPARIN SODIUM 100 MG/ML ~~LOC~~ SOLN
85.0000 mg | Freq: Two times a day (BID) | SUBCUTANEOUS | Status: DC
  Administered 2013-08-28 – 2013-08-30 (×6): 85 mg via SUBCUTANEOUS
  Filled 2013-08-28 (×9): qty 1

## 2013-08-28 MED ORDER — TRACE MINERALS CR-CU-F-FE-I-MN-MO-SE-ZN IV SOLN
INTRAVENOUS | Status: AC
  Administered 2013-08-28: 18:00:00 via INTRAVENOUS
  Filled 2013-08-28: qty 1000

## 2013-08-28 MED ORDER — PIPERACILLIN-TAZOBACTAM 3.375 G IVPB
3.3750 g | Freq: Three times a day (TID) | INTRAVENOUS | Status: DC
  Administered 2013-08-28 – 2013-09-06 (×28): 3.375 g via INTRAVENOUS
  Filled 2013-08-28 (×32): qty 50

## 2013-08-28 MED ORDER — DILTIAZEM HCL 60 MG PO TABS
60.0000 mg | ORAL_TABLET | Freq: Four times a day (QID) | ORAL | Status: DC
  Administered 2013-08-28 – 2013-08-29 (×4): 60 mg via ORAL
  Filled 2013-08-28 (×8): qty 1

## 2013-08-28 NOTE — Progress Notes (Signed)
PARENTERAL NUTRITION CONSULT NOTE - FOLLOW UP  Pharmacy Consult for TPN Indication: Diverticulitis s/p drain, inability to advance diet  Allergies  Allergen Reactions  . Atenolol Nausea And Vomiting  . Clarithromycin Nausea And Vomiting  . Codeine Sulfate Nausea Only  . Macrodantin Other (See Comments)    Long time ago - reaction unknown  . Percocet [Oxycodone-Acetaminophen] Nausea And Vomiting    Patient Measurements: Height: 5\' 3"  (160 cm) Weight: 187 lb 2.7 oz (84.9 kg) IBW/kg (Calculated) : 52.4 Adjusted Body Weight: 62.2 kg  Vital Signs: Temp: 98.7 F (37.1 C) (08/09 0745) Temp src: Oral (08/09 0745) BP: 120/71 mmHg (08/09 0745) Pulse Rate: 61 (08/09 0415) Intake/Output from previous day: 08/08 0701 - 08/09 0700 In: 2234.2 [I.V.:2224.2] Out: 855 [Urine:825; Drains:30] Intake/Output from this shift: Total I/O In: 390 [I.V.:290; IV Piggyback:100] Out: 160 [Urine:160]  Labs:  Recent Labs  08/26/13 0055 08/27/13 0445 08/28/13 0310  WBC 24.9* 18.5* 13.7*  HGB 11.1* 10.2* 10.5*  HCT 33.4* 31.7* 32.8*  PLT 232 233 254  INR 1.60* 1.51* 1.49     Recent Labs  08/26/13 0055 08/27/13 0445 08/28/13 0310  NA 136* 137 136*  K 4.0 3.5* 4.3  CL 98 102 102  CO2 24 25 21   GLUCOSE 121* 129* 127*  BUN 10 14 13   CREATININE 0.78 0.88 0.77  CALCIUM 7.9* 8.1* 8.4  MG  --   --  2.1  PHOS  --   --  2.8  PROT 6.4 6.1  --   ALBUMIN 2.5* 2.1*  --   AST 23 16  --   ALT 21 15  --   ALKPHOS 87 70  --   BILITOT 1.2 0.7  --    Estimated Creatinine Clearance: 53.1 ml/min (by C-G formula based on Cr of 0.77).   No results found for this basename: GLUCAP,  in the last 72 hours   Insulin Requirements in the past 24 hours:  No SSI ordered  Current Nutrition:  NPO except ice chips  Nutritional Goals:  1750-1950 kCal, 110-125 grams of protein per day  Assessment: Admit: Diverticulitis with abscess s/p drainage per IR GI: Diverticulitis with abscess, drain placed  8/7.  Currently NPO except ice chips, anticipate diet advancement may be slow per C[facility] notes. Endo: serum glucose ok, no history of DM. Will check CBGs and provide SSI coverage with TPN initiation. Lytes: K 4.3, Mag 2.1, Phos 2.8.  Will monitor for refeeding with TPN initiation. Renal: SCr 0.8.  IV fluids D5NS at 60 ml/hr. Pulm: RA Cards: Afib - Coumadin on hold and currently anticoagulated with Lovenox Hepatobil: LFTs within normal limits Neuro/MSK: left open tibial fracture June 2015 with wound vac ID:  ABX D#4 for diverticulitis with abscess, currently D#3 Ertapenem.  Enterococcus and Gram negative in culture - ertapenem does not have good Enterococcal coverage.  Will discuss with Dr. Thereasa Solo. Best Practices: Lovenox TPN Access: PICC line (8/7) TPN day#: 1 (start date 8/9)  Plan:  Start Clinimix E 5/15 at 21ml/hr + 20% lipid emulsion at 10 ml/hr. Daily multivitamin and trace elements.   Decrease IV fluids to [facility] when TPN starts. Check CBGs q6h and provide sensitive SSI for coverage CMET, Mag, Phos, Prealbumin, Triglycerides with AM labs and weekly on Mondays while on TPN Discussed with Dr. Thereasa Solo - Change Ertapenem to Zosyn 3.375gm IV q8h extended infusion for better coverage of Enterococcus.  Legrand Como, Pharm.D., BCPS, AAHIVP Clinical Pharmacist Phone: [phone] or [phone] 08/28/2013, 10:46 AM

## 2013-08-28 NOTE — Progress Notes (Signed)
[place] TEAM 1 - Stepdown/ICU TEAM Progress Note  SHANIA BJELLAND TDV:[id number] DOB: Feb 28, 1925 DOA: 08/25/2013 PCP: Walker Kehr, MD  Admit HPI / Brief Narrative: 80 y.o. female w/ a past medical history of atrial fibrillation on chronic anticoagulation and recent fall which led to an open fracture of the lower extremity requiring surgical intervention and wound care with wound VAC. Patient was in skilled nursing for rehabilitation and started having L lower quadrant abdominal pain.   CT scan of the abdomen was noted for inflammatory changes in the pelvis and right lower quadrant with wall thickening of small bowel adjacent loops concerning for infectious ileitis and findings consistent with a possible ruptured diverticulitis and abscess. General Surgery was called for consultation. Lab work noted white blood cell count 21, anion gap of 16 with lactic acid level of 3.76. Patient was started on IV Cipro and Flagyl.   Significant Events: 8/7 - transgluteal percutaneous drainage in IR 8/7 - PICC line placement 8/8 - begin TNA   HPI/Subjective: Pt looks better today, but states she feels "about the same."  Denies cp, sob, or vomiting.    Assessment/Plan:  Diverticulitis of colon with perforation As per Gen Surg - s/p L transgluteal percutaneous drainage in IR 8/7 - diet per Gen Surg, but anticipate very slow advancement - abscess cultures noting enterococcus (sensitivities pending) - begin TNA as pt now 4+ days since oral intake   Sepsis (infection, WBC 25, HR 112) Sepsis physiology improving - WBC declining - no fever - cont abx tx   Metabolic acidosis resolved w/ volume resuscitation   Chronic afib w/ acute RVR RVR now resolved - able to tolerate some oral meds - will attempt to transition to oral rate meds today - heparin gtt infusing w/o bleeding difficulty so will transition to lovenox - resume coumadin when tolerating oral intake   Chronic diastolic CHF EF 06% on echogram in  2012 - currently euvolemic   UTI Current abx should provide adequate coverage - appears that urine was not sent for cx  HTN BP currently well controlled - follow  Hypothyroid  Cont home synthroid dose  Hx of Fe deficient anemia Fe studies most c/w "anemia of chronic disease" i.e. likely a result of inflammatory state and recent poor nutrition   Macrocytosis B12 normal/high June 2014 - recheck notes adequate stores (658)  Mechanical fall with left open tibial fracture June 2015 Cont current prescribed wound care (followed by Dr. Migdalia Dk) - dressing to stay in place until 08/29/13 at which time Plastics will evaluate wound and replace  Obesity Unspecified   Code Status: NO CODE BLUE  Family Communication: no family present at time of exam  Disposition Plan: SDU  Consultants: Gen Surgery  IR  Antibiotics: Cipro 8/06 Flagyl 8/06 Ertapenem 8/07 >>  DVT prophylaxis: IV heparin > lovenox   Objective: Blood pressure 120/71, pulse 61, temperature 98.7 F (37.1 C), temperature source Oral, resp. rate 18, height 5\' 3"  (1.6 m), weight 84.9 kg (187 lb 2.7 oz), SpO2 91.00%.  Intake/Output Summary (Last 24 hours) at 08/28/13 0944 Last data filed at 08/28/13 0900  Gross per 24 hour  Intake 2364.2 ml  Output   1015 ml  Net 1349.2 ml   Exam: General: No acute respiratory distress Lungs: Clear to auscultation bilaterally without wheezes or crackles Cardiovascular: irreg irreg - rate controlled - no appreciable M or rub  Abdomen: obese, soft, diffusely tender - no rebound - no appreciable mass - BS present  Extremities:  No significant cyanosis, clubbing, or edema bilateral lower extremities - L LE wound dressed with VAC in place   Data Reviewed: Basic Metabolic Panel:  Recent Labs Lab 08/25/13 1008 08/25/13 1930 08/26/13 0055 08/27/13 0445 08/28/13 0310  NA 140 138 136* 137 136*  K 3.6* 3.2* 4.0 3.5* 4.3  CL 95* 98 98 102 102  CO2 29 26 24 25 21   GLUCOSE 123* 111* 121*  129* 127*  BUN 13 11 10 14 13   CREATININE 0.82 0.81 0.78 0.88 0.77  CALCIUM 8.8 8.1* 7.9* 8.1* 8.4  MG  --   --   --   --  2.1  PHOS  --   --   --   --  2.8   Liver Function Tests:  Recent Labs Lab 08/25/13 1008 08/26/13 0055 08/27/13 0445  AST 24 23 16   ALT 31 21 15   ALKPHOS 90 87 70  BILITOT 1.2 1.2 0.7  PROT 7.7 6.4 6.1  ALBUMIN 3.2* 2.5* 2.1*    Recent Labs Lab 08/25/13 1008  LIPASE 13   Coags:  Recent Labs Lab 08/22/13 1100 08/25/13 1008 08/26/13 0055 08/27/13 0445 08/28/13 0310  INR 1.14 1.49 1.60* 1.51* 1.49   No results found for this basename: PTT,  in the last 168 hours  CBC:  Recent Labs Lab 08/25/13 1008 08/25/13 1930 08/26/13 0055 08/27/13 0445 08/28/13 0310  WBC 21.9* 24.9* 24.9* 18.5* 13.7*  HGB 14.8 12.7 11.1* 10.2* 10.5*  HCT 44.3 38.7 33.4* 31.7* 32.8*  MCV 105.5* 109.3* 106.0* 106.7* 106.5*  PLT 268 279 232 233 254   CBG: No results found for this basename: GLUCAP,  in the last 168 hours  Recent Results (from the past 240 hour(s))  ANAEROBIC CULTURE     Status: None   Collection Time    08/22/13  1:41 PM      Result Value Ref Range Status   Specimen Description LEG LEFT BONE CHIP   Final   Special Requests     Final   Value: PATIENT ON FOLLOWING CIPRO 400 MG PRIOR TO CASE STARTING   Gram Stain     Final   Value: NO WBC SEEN     NO SQUAMOUS EPITHELIAL CELLS SEEN     NO ORGANISMS SEEN     Performed at [facility]   Culture     Final   Value: NO ANAEROBES ISOLATED     Performed at [facility]   Report Status 08/27/2013 FINAL   Final  TISSUE CULTURE     Status: None   Collection Time    08/22/13  1:41 PM      Result Value Ref Range Status   Specimen Description LEG LEFT BONE CHIP   Final   Special Requests     Final   Value: PATIENT ON FOLLOWING CIPRO 400 MG PRIOR TO CASE STARTING   Gram Stain     Final   Value: NO WBC SEEN     NO SQUAMOUS EPITHELIAL CELLS SEEN     NO ORGANISMS SEEN     Performed at  [facility]   Culture     Final   Value: FEW STAPHYLOCOCCUS AUREUS     Note: RIFAMPIN AND GENTAMICIN SHOULD NOT BE USED AS SINGLE DRUGS FOR TREATMENT OF STAPH INFECTIONS.     Performed at [facility]   Report Status 08/25/2013 FINAL   Final   Organism ID, Bacteria STAPHYLOCOCCUS AUREUS   Final  CULTURE, BLOOD (  ROUTINE X 2)     Status: None   Collection Time    08/25/13  3:05 PM      Result Value Ref Range Status   Specimen Description BLOOD LEFT FOREARM   Final   Special Requests BOTTLES DRAWN AEROBIC ONLY 5CC   Final   Culture  Setup Time     Final   Value: 08/25/2013 18:56     Performed at [facility]   Culture     Final   Value:        BLOOD CULTURE RECEIVED NO GROWTH TO DATE CULTURE WILL BE HELD FOR 5 DAYS BEFORE ISSUING A FINAL NEGATIVE REPORT     Performed at [facility]   Report Status PENDING   Incomplete  CULTURE, BLOOD (ROUTINE X 2)     Status: None   Collection Time    08/25/13  3:45 PM      Result Value Ref Range Status   Specimen Description BLOOD LEFT FOREARM   Final   Special Requests BOTTLES DRAWN AEROBIC ONLY 5CC   Final   Culture  Setup Time     Final   Value: 08/25/2013 20:08     Performed at [facility]   Culture     Final   Value:        BLOOD CULTURE RECEIVED NO GROWTH TO DATE CULTURE WILL BE HELD FOR 5 DAYS BEFORE ISSUING A FINAL NEGATIVE REPORT     Performed at [facility]   Report Status PENDING   Incomplete  MRSA PCR SCREENING     Status: None   Collection Time    08/25/13  6:02 PM      Result Value Ref Range Status   MRSA by PCR NEGATIVE  NEGATIVE Final   Comment:            The GeneXpert MRSA Assay (FDA     approved for NASAL specimens     only), is one component of a     comprehensive MRSA colonization     surveillance program. It is not     intended to diagnose MRSA     infection nor to guide or     monitor treatment for     MRSA infections.  SURGICAL PCR SCREEN     Status: None    Collection Time    08/26/13 10:27 AM      Result Value Ref Range Status   MRSA, PCR NEGATIVE  NEGATIVE Final   Staphylococcus aureus NEGATIVE  NEGATIVE Final   Comment:            The Xpert SA Assay (FDA     approved for NASAL specimens     in patients over 41 years of age),     is one component of     a comprehensive surveillance     program.  Test performance has     been validated by [employer] for patients greater     than or equal to 6 year old.     It is not intended     to diagnose infection nor to     guide or monitor treatment.  CULTURE, ROUTINE-ABSCESS     Status: None   Collection Time    08/26/13  2:04 PM      Result Value Ref Range Status   Specimen Description BUTTOCKS LEFT   Final   Special Requests LEFT UPPER BUTTOCK JP DRAIN  Final   Gram Stain     Final   Value: ABUNDANT WBC PRESENT,BOTH PMN AND MONONUCLEAR     NO SQUAMOUS EPITHELIAL CELLS SEEN     ABUNDANT GRAM POSITIVE RODS     ABUNDANT GRAM NEGATIVE RODS     FEW GRAM POSITIVE COCCI   Culture     Final   Value: MODERATE GRAM NEGATIVE RODS     MODERATE ENTEROCOCCUS SPECIES     Performed at [facility]   Report Status PENDING   Incomplete  ANAEROBIC CULTURE     Status: None   Collection Time    08/26/13  2:04 PM      Result Value Ref Range Status   Specimen Description ABSCESS BUTTOCKS LEFT   Final   Special Requests LEFT UPPER BUTTOCK JP DRAIN   Final   Gram Stain     Final   Value: ABUNDANT WBC PRESENT,BOTH PMN AND MONONUCLEAR     NO SQUAMOUS EPITHELIAL CELLS SEEN     ABUNDANT GRAM POSITIVE RODS     ABUNDANT GRAM NEGATIVE RODS     FEW GRAM POSITIVE COCCI   Culture     Final   Value: NO ANAEROBES ISOLATED; CULTURE IN PROGRESS FOR 5 DAYS     Performed at [facility]   Report Status PENDING   Incomplete     Studies:  Recent x-ray studies have been reviewed in detail by the Attending Physician  Scheduled Meds:  Scheduled Meds: . ertapenem  1 g Intravenous Q24H  .  levothyroxine  25 mcg Oral QAC breakfast  . metoprolol succinate  50 mg Oral BID    Time spent on care of this patient: 35 mins   Delesha Pohlman T , MD   [facility] Office  [phone] Pager - Text Page per Shea Evans as per below:  On-Call/Text Page:      [email or web address]      password [email or web address]  If 7PM-7AM, please contact night-coverage [email or web address] Password [email or web address] 08/28/2013, 9:44 AM   LOS: 3 days

## 2013-08-28 NOTE — Progress Notes (Signed)
[place] Progress Note Patient Name: [name] DOB: 22-Jul-1925 MRN: [id number]  Date of Service  08/28/2013   HPI/Events of Note   Review of culture & abx per protocol. On broad-spectrum gram negative coverage. Does have enteroccocus growing but primary team aware per their note.   eICU Interventions   No action.   Intervention Category Intermediate Interventions: OtherLowry Ram, Nakyia Dau R. 08/28/2013, 3:55 PM

## 2013-08-28 NOTE — Progress Notes (Addendum)
[facility] for Heparin Indication: atrial fibrillation  Allergies  Allergen Reactions  . Atenolol Nausea And Vomiting  . Clarithromycin Nausea And Vomiting  . Codeine Sulfate Nausea Only  . Macrodantin Other (See Comments)    Long time ago - reaction unknown  . Percocet [Oxycodone-Acetaminophen] Nausea And Vomiting    Patient Measurements: Height: 5\' 3"  (160 cm) Weight: 187 lb 2.7 oz (84.9 kg) IBW/kg (Calculated) : 52.4 Heparin Dosing Weight: 70 kg  Vital Signs: Temp: 98.7 F (37.1 C) (08/09 0745) Temp src: Oral (08/09 0745) BP: 120/71 mmHg (08/09 0745) Pulse Rate: 61 (08/09 0415)  Labs:  Recent Labs  08/26/13 0055 08/27/13 0445 08/27/13 1625 08/27/13 1821 08/28/13 0310  HGB 11.1* 10.2*  --   --  10.5*  HCT 33.4* 31.7*  --   --  32.8*  PLT 232 233  --   --  254  LABPROT 19.1* 18.2*  --   --  18.0*  INR 1.60* 1.51*  --   --  1.49  HEPARINUNFRC <0.10*  --  1.14* <0.10* 0.41  CREATININE 0.78 0.88  --   --  0.77    Estimated Creatinine Clearance: 53.1 ml/min (by C-G formula based on Cr of 0.77).  Assessment: 80 yo female with Afib, Coumadin on hold since NPO for diverticulitis with abscess now s/p IR drain placement on 8/7. She was restarted on heparin on 8/8 and HL is now therapeutic at 0.41. H/H has trended down but stable since yesterday, plt wnl and stable, no reported s/s bleeding.    Goal of Therapy:  Heparin level 0.3-0.7 units/ml Monitor platelets by anticoagulation protocol: Yes   Plan:  - Continue heparin gtt at 1500 u/hr - F/u 8 hour HL to confirm then daily  - Monitor CBC and s/s bleeding closely   [name], PharmD Clinical Pharmacist - Resident Pager: [phone] Pharmacy: [phone] 08/28/2013 10:23 AM   Now to transition to lovenox - Will d/c heparin gtt and in 1 hour start lovenox 85 mg SQ q12h  - Will continue to monitor CBC, s/s bleeding  [name], PharmD Clinical Pharmacist -  Resident Pager: [phone] Pharmacy: [phone] 08/28/2013 10:23 AM

## 2013-08-28 NOTE — Progress Notes (Signed)
Patient ID: [name], female   DOB: 1925/10/16, 80 y.o.   MRN: [id number]     [facility]ERY      [name] [place], [place] [address]    Phone: [phone] FAX: [phone]     Subjective: Still no flatus. Continues to have pain and reports little improvement since admission.  White count continues to trend down and she has been afebrile.    Objective:  Vital signs:  Filed Vitals:   08/28/13 0015 08/28/13 0100 08/28/13 0410 08/28/13 0415  BP: 118/50   119/54  Pulse:  67 69 61  Temp:    98.9 F (37.2 C)  TempSrc:    Oral  Resp:  $Remo'15 14 14  'YZsfS$ Height:      Weight:      SpO2:  95% 93% 92%    Last BM Date: 08/25/13  Intake/Output   Yesterday:  08/08 0701 - 08/09 0700 In: 1979.2 [I.V.:1969.2] Out: 855 [Urine:825; Drains:30] This shift:    I/O last 3 completed shifts: In: 3087.5 [I.V.:3067.5; Other:20] Out: 1372 [Urine:1325; Drains:47]   Physical Exam: General: Pt awake/alert/oriented x4 in no acute distress Chest: cta.  No chest wall pain w good excursion CV:  S1S2 irregularly irregular.  Abdomen: Soft.  Nondistended. TTP to RLQ.   No evidence of peritonitis.  No incarcerated hernias.  Gluteal drain with serous cloudy output.   Ext:  RLE dressing, PRAFO boot.  No mjr edema.  No cyanosis   Problem List:   Principal Problem:   Sepsis Active Problems:   HYPOTHYROIDISM   Iron deficiency anemia, unspecified   HYPERTENSION   Atrial flutter   Unspecified constipation   Metabolic acidosis   Diverticulitis of colon with perforation    Results:   Labs: Results for orders placed during the hospital encounter of 08/25/13 (from the past 48 hour(s))  SURGICAL PCR SCREEN     Status: None   Collection Time    08/26/13 10:27 AM      Result Value Ref Range   MRSA, PCR NEGATIVE  NEGATIVE   Staphylococcus aureus NEGATIVE  NEGATIVE   Comment:            The Xpert SA Assay (FDA     approved for NASAL specimens     in  patients over 52 years of age),     is one component of     a comprehensive surveillance     program.  Test performance has     been validated by [employer] for patients greater     than or equal to 35 year old.     It is not intended     to diagnose infection nor to     guide or monitor treatment.  CULTURE, ROUTINE-ABSCESS     Status: None   Collection Time    08/26/13  2:04 PM      Result Value Ref Range   Specimen Description BUTTOCKS LEFT     Special Requests LEFT UPPER BUTTOCK JP DRAIN     Gram Stain       Value: ABUNDANT WBC PRESENT,BOTH PMN AND MONONUCLEAR     NO SQUAMOUS EPITHELIAL CELLS SEEN     ABUNDANT GRAM POSITIVE RODS     ABUNDANT GRAM NEGATIVE RODS     FEW GRAM POSITIVE COCCI   Culture       Value: MODERATE GRAM NEGATIVE RODS     MODERATE ENTEROCOCCUS  SPECIES     Performed at [facility]   Report Status PENDING    ANAEROBIC CULTURE     Status: None   Collection Time    08/26/13  2:04 PM      Result Value Ref Range   Specimen Description ABSCESS BUTTOCKS LEFT     Special Requests LEFT UPPER BUTTOCK JP DRAIN     Gram Stain       Value: ABUNDANT WBC PRESENT,BOTH PMN AND MONONUCLEAR     NO SQUAMOUS EPITHELIAL CELLS SEEN     ABUNDANT GRAM POSITIVE RODS     ABUNDANT GRAM NEGATIVE RODS     FEW GRAM POSITIVE COCCI   Culture       Value: NO ANAEROBES ISOLATED; CULTURE IN PROGRESS FOR 5 DAYS     Performed at [facility]   Report Status PENDING    PROTIME-INR     Status: Abnormal   Collection Time    08/27/13  4:45 AM      Result Value Ref Range   Prothrombin Time 18.2 (*) 11.6 - 15.2 seconds   INR 1.51 (*) 0.00 - 1.49  CBC     Status: Abnormal   Collection Time    08/27/13  4:45 AM      Result Value Ref Range   WBC 18.5 (*) 4.0 - 10.5 K/uL   RBC 2.97 (*) 3.87 - 5.11 MIL/uL   Hemoglobin 10.2 (*) 12.0 - 15.0 g/dL   HCT 31.7 (*) 36.0 - 46.0 %   MCV 106.7 (*) 78.0 - 100.0 fL   MCH 34.3 (*) 26.0 - 34.0 pg   MCHC 32.2  30.0 - 36.0 g/dL    RDW 13.1  11.5 - 15.5 %   Platelets 233  150 - 400 K/uL  COMPREHENSIVE METABOLIC PANEL     Status: Abnormal   Collection Time    08/27/13  4:45 AM      Result Value Ref Range   Sodium 137  137 - 147 mEq/L   Potassium 3.5 (*) 3.7 - 5.3 mEq/L   Chloride 102  96 - 112 mEq/L   CO2 25  19 - 32 mEq/L   Glucose, Bld 129 (*) 70 - 99 mg/dL   BUN 14  6 - 23 mg/dL   Creatinine, Ser 0.88  0.50 - 1.10 mg/dL   Calcium 8.1 (*) 8.4 - 10.5 mg/dL   Total Protein 6.1  6.0 - 8.3 g/dL   Albumin 2.1 (*) 3.5 - 5.2 g/dL   AST 16  0 - 37 U/L   ALT 15  0 - 35 U/L   Alkaline Phosphatase 70  39 - 117 U/L   Total Bilirubin 0.7  0.3 - 1.2 mg/dL   GFR calc non Af Amer 58 (*) >90 mL/min   GFR calc Af Amer 68 (*) >90 mL/min   Comment: (NOTE)     The eGFR has been calculated using the CKD EPI equation.     This calculation has not been validated in all clinical situations.     eGFR's persistently <90 mL/min signify possible Chronic Kidney     Disease.   Anion gap 10  5 - 15  VITAMIN B12     Status: None   Collection Time    08/27/13  4:45 AM      Result Value Ref Range   Vitamin B-12 658  211 - 911 pg/mL   Comment: Performed at [facility]  Status: None   Collection Time    08/27/13  4:45 AM      Result Value Ref Range   Folate 15.8     Comment: (NOTE)     Reference Ranges            Deficient:       0.4 - 3.3 ng/mL            Indeterminate:   3.4 - 5.4 ng/mL            Normal:              > 5.4 ng/mL     Performed at [facility]  IRON AND TIBC     Status: Abnormal   Collection Time    08/27/13  4:45 AM      Result Value Ref Range   Iron 18 (*) 42 - 135 ug/dL   TIBC 144 (*) 250 - 470 ug/dL   Saturation Ratios 13 (*) 20 - 55 %   UIBC 126  125 - 400 ug/dL   Comment: Performed at [facility]     Status: Abnormal   Collection Time    08/27/13  4:45 AM      Result Value Ref Range   Ferritin 791 (*) 10 - 291 ng/mL   Comment: Performed at [facility]     Status: Abnormal   Collection Time    08/27/13  4:45 AM      Result Value Ref Range   Retic Ct Pct 1.9  0.4 - 3.1 %   RBC. 2.97 (*) 3.87 - 5.11 MIL/uL   Retic Count, Manual 56.4  19.0 - 186.0 K/uL  LACTIC ACID, PLASMA     Status: None   Collection Time    08/27/13  4:48 AM      Result Value Ref Range   Lactic Acid, Venous 0.8  0.5 - 2.2 mmol/L  HEPARIN LEVEL (UNFRACTIONATED)     Status: Abnormal   Collection Time    08/27/13  4:25 PM      Result Value Ref Range   Heparin Unfractionated 1.14 (*) 0.30 - 0.70 IU/mL   Comment:            IF HEPARIN RESULTS ARE BELOW     EXPECTED VALUES, AND PATIENT     DOSAGE HAS BEEN CONFIRMED,     SUGGEST FOLLOW UP TESTING     OF ANTITHROMBIN III LEVELS.     RESULTS CONFIRMED BY MANUAL DILUTION  HEPARIN LEVEL (UNFRACTIONATED)     Status: Abnormal   Collection Time    08/27/13  6:21 PM      Result Value Ref Range   Heparin Unfractionated <0.10 (*) 0.30 - 0.70 IU/mL   Comment:            IF HEPARIN RESULTS ARE BELOW     EXPECTED VALUES, AND PATIENT     DOSAGE HAS BEEN CONFIRMED,     SUGGEST FOLLOW UP TESTING     OF ANTITHROMBIN III LEVELS.  PROTIME-INR     Status: Abnormal   Collection Time    08/28/13  3:10 AM      Result Value Ref Range   Prothrombin Time 18.0 (*) 11.6 - 15.2 seconds   INR 1.49  0.00 - 1.49  CBC     Status: Abnormal   Collection Time    08/28/13  3:10 AM      Result Value Ref  Range   WBC 13.7 (*) 4.0 - 10.5 K/uL   RBC 3.08 (*) 3.87 - 5.11 MIL/uL   Hemoglobin 10.5 (*) 12.0 - 15.0 g/dL   HCT 32.8 (*) 36.0 - 46.0 %   MCV 106.5 (*) 78.0 - 100.0 fL   MCH 34.1 (*) 26.0 - 34.0 pg   MCHC 32.0  30.0 - 36.0 g/dL   RDW 13.1  11.5 - 15.5 %   Platelets 254  150 - 400 K/uL  HEPARIN LEVEL (UNFRACTIONATED)     Status: None   Collection Time    08/28/13  3:10 AM      Result Value Ref Range   Heparin Unfractionated 0.41  0.30 - 0.70 IU/mL   Comment:            IF HEPARIN RESULTS ARE BELOW      EXPECTED VALUES, AND PATIENT     DOSAGE HAS BEEN CONFIRMED,     SUGGEST FOLLOW UP TESTING     OF ANTITHROMBIN III LEVELS.  MAGNESIUM     Status: None   Collection Time    08/28/13  3:10 AM      Result Value Ref Range   Magnesium 2.1  1.5 - 2.5 mg/dL  PHOSPHORUS     Status: None   Collection Time    08/28/13  3:10 AM      Result Value Ref Range   Phosphorus 2.8  2.3 - 4.6 mg/dL  BASIC METABOLIC PANEL     Status: Abnormal   Collection Time    08/28/13  3:10 AM      Result Value Ref Range   Sodium 136 (*) 137 - 147 mEq/L   Potassium 4.3  3.7 - 5.3 mEq/L   Comment: DELTA CHECK NOTED   Chloride 102  96 - 112 mEq/L   CO2 21  19 - 32 mEq/L   Glucose, Bld 127 (*) 70 - 99 mg/dL   BUN 13  6 - 23 mg/dL   Creatinine, Ser 0.77  0.50 - 1.10 mg/dL   Calcium 8.4  8.4 - 10.5 mg/dL   GFR calc non Af Amer 74 (*) >90 mL/min   GFR calc Af Amer 86 (*) >90 mL/min   Comment: (NOTE)     The eGFR has been calculated using the CKD EPI equation.     This calculation has not been validated in all clinical situations.     eGFR's persistently <90 mL/min signify possible Chronic Kidney     Disease.   Anion gap 13  5 - 15    Imaging / Studies: Dg Chest Port 1 View  08/27/2013   CLINICAL DATA:  80 year old female - PICC line placement.  EXAM: PORTABLE CHEST - 1 VIEW  COMPARISON:  08/26/2013 and prior chest radiographs dating back to 12/29/2008  FINDINGS: Cardiomegaly identified.  The right PICC line is present with tip overlying the superior cavoatrial junction.  Increasing subsegmental right basilar atelectasis noted.  There is no evidence of focal airspace disease, pulmonary edema, suspicious pulmonary nodule/mass, pleural effusion, or pneumothorax. No acute bony abnormalities are identified.  Degenerative changes in the shoulders again identified.  IMPRESSION: Right PICC line with tip overlying the superior cavoatrial junction.  Increasing subsegmental right basilar atelectasis.   Electronically Signed   By:  Hassan Rowan M.D.   On: 08/27/2013 12:06   Dg Chest Port 1 View  08/26/2013   ADDENDUM REPORT: 08/26/2013 16:35  ADDENDUM: The PICC could be retracted 2 cm to place the tip  at the cavoatrial junction.   Electronically Signed   By: Logan Bores   On: 08/26/2013 16:35   08/26/2013   CLINICAL DATA:  Line placement.  EXAM: PORTABLE CHEST - 1 VIEW  COMPARISON:  08/25/2013  FINDINGS: There has been interval placement of a right PICC line with tip projecting over the mid to upper right atrium. The cardiac silhouette remains mildly enlarged. The lungs are hypoinflated. Linear opacities in the right midlung is unchanged and may reflect atelectasis or scarring. There is slightly increased prominence of the pulmonary vasculature. No new airspace consolidation, pleural effusion, or pneumothorax is identified. No acute osseous abnormality is seen.  IMPRESSION: Right PICC terminating over the mid to upper right atrium.  Electronically Signed: By: Logan Bores On: 08/26/2013 15:46   Ct Image Guided Drainage By Percutaneous Catheter  08/26/2013   CLINICAL DATA:  Pelvic diverticular abscess  EXAM: CT GUIDED DRAINAGE OF PELVIC DIVERTICULAR ABSCESS  ANESTHESIA/SEDATION: 1.5 Mg IV Versed 50 mcg IV Fentanyl  Total Moderate Sedation Time:  21 minutes  PROCEDURE: The procedure, risks, benefits, and alternatives were explained to the patient. Questions regarding the procedure were encouraged and answered. The patient understands and consents to the procedure.  The left gluteal area was prepped with Betadinein a sterile fashion, and a sterile drape was applied covering the operative field. A sterile gown and sterile gloves were used for the procedure. Local anesthesia was provided with 1% Lidocaine.  Previous imaging reviewed. Patient positioned prone. Noncontrast localization CT performed. The posterior left pelvic abscess was localized. Under sterile conditions and local anesthesia, an 18 gauge 10 cm access needle was advanced  percutaneously from a left trans gluteal approach into the collection. Needle position confirmed with CT. Syringe aspiration yielded fecal contaminated fluid. Guidewire inserted followed by tract dilatation to insert a 10 [place] drain. Drain catheter confirmed in the abscess cavity. 25 cc fecal contaminated fluid aspirated. Catheter secured with a Prolene suture and connected to external suction bulb. Sterile dressing applied. No immediate complication. Patient tolerated the procedure well.  COMPLICATIONS: No immediate  FINDINGS: Imaging confirms needle placement in the left pelvic diverticular abscess for drain placement  IMPRESSION: Successful CT-guided left pelvic diverticular abscess drain insertion. Sample sent for Gram stain culture.   Electronically Signed   By: Daryll Brod M.D.   On: 08/26/2013 14:12    Medications / Allergies:  Scheduled Meds: . ertapenem  1 g Intravenous Q24H  . levothyroxine  25 mcg Oral QAC breakfast  . metoprolol succinate  50 mg Oral BID   Continuous Infusions: . dextrose 5 % and 0.9% NaCl 60 mL/hr at 08/28/13 0400  . diltiazem (CARDIZEM) infusion 15 mg/hr (08/28/13 0710)  . heparin 1,500 Units/hr (08/28/13 0400)   PRN Meds:.LORazepam, morphine injection, ondansetron (ZOFRAN) IV, ondansetron, sodium chloride  Antibiotics: Anti-infectives   Start     Dose/Rate Route Frequency Ordered Stop   08/26/13 0800  ertapenem (INVANZ) 1 g in sodium chloride 0.9 % 50 mL IVPB     1 g 100 mL/hr over 30 Minutes Intravenous Every 24 hours 08/26/13 0747     08/26/13 0200  ciprofloxacin (CIPRO) IVPB 400 mg  Status:  Discontinued     400 mg 200 mL/hr over 60 Minutes Intravenous Every 12 hours 08/25/13 1646 08/26/13 0747   08/25/13 1645  metroNIDAZOLE (FLAGYL) IVPB 500 mg  Status:  Discontinued     500 mg 100 mL/hr over 60 Minutes Intravenous Every 8 hours 08/25/13 1636 08/26/13 0747   08/25/13  1345  ciprofloxacin (CIPRO) IVPB 400 mg     400 mg 200 mL/hr over 60 Minutes  Intravenous  Once 08/25/13 1344 08/25/13 1515   08/25/13 1345  metroNIDAZOLE (FLAGYL) IVPB 500 mg     500 mg 100 mL/hr over 60 Minutes Intravenous  Once 08/25/13 1344 08/25/13 1554       Assessment/Plan  (Hospital problem)  Atrial flutter-heparin/cardizem  HTN  Hypothyroidism  IDA  Chronic diastolic heart failure   (Principal problem)  sepsis Abdominal pain, RLQ inflammatory process  Diverticulitis with abscess  -s/p IR drain placement 8/7  -Cx GNR, moderate enterococcus species -c/w Invanz  -clears today, do not advance  -IVF  -repeat CBC in AM  -continue with conservative management, hopefully we can avoid surgery.  We will consider repeat CT scan in a few days  Erby Pian, [facility] Surgery Pager [phone] Office [phone]  08/28/2013 7:42 AM

## 2013-08-28 NOTE — Progress Notes (Signed)
I agree with the current plan.  Would go very slowly with advancing her diet.  Kathryne Eriksson. Dahlia Bailiff, MD, [place] [phone] [facility]

## 2013-08-29 ENCOUNTER — Inpatient Hospital Stay (HOSPITAL_COMMUNITY): Payer: Medicare Other

## 2013-08-29 LAB — MAGNESIUM: Magnesium: 1.7 mg/dL (ref 1.5–2.5)

## 2013-08-29 LAB — DIFFERENTIAL
Basophils Absolute: 0 10*3/uL (ref 0.0–0.1)
Basophils Relative: 0 % (ref 0–1)
Eosinophils Absolute: 0.2 10*3/uL (ref 0.0–0.7)
Eosinophils Relative: 1 % (ref 0–5)
Lymphocytes Relative: 6 % — ABNORMAL LOW (ref 12–46)
Lymphs Abs: 0.9 10*3/uL (ref 0.7–4.0)
Monocytes Absolute: 1.1 10*3/uL — ABNORMAL HIGH (ref 0.1–1.0)
Monocytes Relative: 8 % (ref 3–12)
Neutro Abs: 12.8 10*3/uL — ABNORMAL HIGH (ref 1.7–7.7)
Neutrophils Relative %: 85 % — ABNORMAL HIGH (ref 43–77)

## 2013-08-29 LAB — CULTURE, ROUTINE-ABSCESS

## 2013-08-29 LAB — COMPREHENSIVE METABOLIC PANEL
ALT: 22 U/L (ref 0–35)
AST: 23 U/L (ref 0–37)
Albumin: 2.3 g/dL — ABNORMAL LOW (ref 3.5–5.2)
Alkaline Phosphatase: 92 U/L (ref 39–117)
Anion gap: 14 (ref 5–15)
BUN: 10 mg/dL (ref 6–23)
CO2: 24 mEq/L (ref 19–32)
Calcium: 8.3 mg/dL — ABNORMAL LOW (ref 8.4–10.5)
Chloride: 100 mEq/L (ref 96–112)
Creatinine, Ser: 0.68 mg/dL (ref 0.50–1.10)
GFR calc Af Amer: 90 mL/min — ABNORMAL LOW (ref >90)
GFR calc non Af Amer: 78 mL/min — ABNORMAL LOW (ref >90)
Glucose, Bld: 148 mg/dL — ABNORMAL HIGH (ref 70–99)
Potassium: 3.5 mEq/L — ABNORMAL LOW (ref 3.7–5.3)
Sodium: 138 mEq/L (ref 137–147)
Total Bilirubin: 1 mg/dL (ref 0.3–1.2)
Total Protein: 6.6 g/dL (ref 6.0–8.3)

## 2013-08-29 LAB — GLUCOSE, CAPILLARY
Glucose-Capillary: 114 mg/dL — ABNORMAL HIGH (ref 70–99)
Glucose-Capillary: 156 mg/dL — ABNORMAL HIGH (ref 70–99)
Glucose-Capillary: 177 mg/dL — ABNORMAL HIGH (ref 70–99)
Glucose-Capillary: 177 mg/dL — ABNORMAL HIGH (ref 70–99)

## 2013-08-29 LAB — CBC
HCT: 33.7 % — ABNORMAL LOW (ref 36.0–46.0)
Hemoglobin: 11.3 g/dL — ABNORMAL LOW (ref 12.0–15.0)
MCH: 35.8 pg — ABNORMAL HIGH (ref 26.0–34.0)
MCHC: 33.5 g/dL (ref 30.0–36.0)
MCV: 106.6 fL — ABNORMAL HIGH (ref 78.0–100.0)
Platelets: 322 10*3/uL (ref 150–400)
RBC: 3.16 MIL/uL — ABNORMAL LOW (ref 3.87–5.11)
RDW: 12.9 % (ref 11.5–15.5)
WBC: 14.9 10*3/uL — ABNORMAL HIGH (ref 4.0–10.5)

## 2013-08-29 LAB — PHOSPHORUS: Phosphorus: 2.6 mg/dL (ref 2.3–4.6)

## 2013-08-29 LAB — PREALBUMIN: Prealbumin: 8 mg/dL — ABNORMAL LOW (ref 17.0–34.0)

## 2013-08-29 LAB — PROTIME-INR
INR: 1.47 (ref 0.00–1.49)
Prothrombin Time: 17.8 seconds — ABNORMAL HIGH (ref 11.6–15.2)

## 2013-08-29 LAB — TRIGLYCERIDES: Triglycerides: 73 mg/dL (ref <150)

## 2013-08-29 MED ORDER — POTASSIUM CHLORIDE 10 MEQ/100ML IV SOLN: 10.0000 meq | INTRAVENOUS | Status: DC

## 2013-08-29 MED ORDER — POTASSIUM CHLORIDE 10 MEQ/50ML IV SOLN
10.0000 meq | Freq: Once | INTRAVENOUS | Status: AC
  Administered 2013-08-29: 10 meq via INTRAVENOUS

## 2013-08-29 MED ORDER — METOPROLOL TARTRATE 1 MG/ML IV SOLN
5.0000 mg | Freq: Four times a day (QID) | INTRAVENOUS | Status: DC
  Administered 2013-08-29 – 2013-09-05 (×27): 5 mg via INTRAVENOUS
  Filled 2013-08-29 (×32): qty 5

## 2013-08-29 MED ORDER — TRACE MINERALS CR-CU-F-FE-I-MN-MO-SE-ZN IV SOLN
INTRAVENOUS | Status: AC
  Administered 2013-08-29: 17:00:00 via INTRAVENOUS
  Filled 2013-08-29: qty 1000

## 2013-08-29 MED ORDER — PROMETHAZINE HCL 25 MG/ML IJ SOLN
12.5000 mg | Freq: Once | INTRAMUSCULAR | Status: AC
  Administered 2013-08-29: 12.5 mg via INTRAVENOUS
  Filled 2013-08-29: qty 1

## 2013-08-29 MED ORDER — ACETAMINOPHEN 650 MG RE SUPP: 650.0000 mg | RECTAL | Status: DC | PRN

## 2013-08-29 MED ORDER — ONDANSETRON HCL 4 MG/2ML IJ SOLN
4.0000 mg | INTRAMUSCULAR | Status: DC | PRN
  Administered 2013-08-29 – 2013-09-01 (×3): 4 mg via INTRAVENOUS
  Filled 2013-08-29 (×4): qty 2

## 2013-08-29 MED ORDER — LEVOTHYROXINE SODIUM 100 MCG IV SOLR
12.5000 ug | Freq: Every day | INTRAVENOUS | Status: DC
  Administered 2013-08-30 – 2013-09-05 (×6): 12.5 ug via INTRAVENOUS
  Filled 2013-08-29 (×7): qty 5

## 2013-08-29 MED ORDER — ACETAMINOPHEN 325 MG PO TABS
650.0000 mg | ORAL_TABLET | ORAL | Status: DC | PRN
  Administered 2013-09-02 – 2013-09-08 (×6): 650 mg via ORAL
  Filled 2013-08-29 (×6): qty 2

## 2013-08-29 MED ORDER — FAT EMULSION 20 % IV EMUL
240.0000 mL | INTRAVENOUS | Status: AC
  Administered 2013-08-29: 240 mL via INTRAVENOUS
  Filled 2013-08-29: qty 250

## 2013-08-29 MED ORDER — DEXTROSE 5 % IV SOLN
5.0000 mg/h | INTRAVENOUS | Status: DC
  Administered 2013-08-29 – 2013-09-04 (×4): 5 mg/h via INTRAVENOUS
  Filled 2013-08-29: qty 100

## 2013-08-29 MED ORDER — SODIUM CHLORIDE 0.9 % IV SOLN
INTRAVENOUS | Status: DC
  Administered 2013-08-29: 09:00:00 via INTRAVENOUS

## 2013-08-29 MED ORDER — LORAZEPAM 2 MG/ML IJ SOLN: 0.5000 mg | Freq: Three times a day (TID) | INTRAMUSCULAR | Status: DC | PRN

## 2013-08-29 MED ORDER — TEMAZEPAM 15 MG PO CAPS
30.0000 mg | ORAL_CAPSULE | Freq: Every day | ORAL | Status: DC
  Administered 2013-08-29 – 2013-09-07 (×9): 30 mg via ORAL
  Filled 2013-08-29 (×9): qty 2

## 2013-08-29 MED ORDER — POTASSIUM CHLORIDE 10 MEQ/50ML IV SOLN
10.0000 meq | INTRAVENOUS | Status: AC
  Administered 2013-08-29 (×4): 10 meq via INTRAVENOUS
  Filled 2013-08-29 (×3): qty 50

## 2013-08-29 MED ORDER — ONDANSETRON HCL 4 MG PO TABS: 4.0000 mg | ORAL_TABLET | ORAL | Status: DC | PRN

## 2013-08-29 NOTE — Progress Notes (Signed)
Agree with above, tenderness better, having bowel function,wbc stable, hopefully will resolve conservatively

## 2013-08-29 NOTE — Progress Notes (Signed)
Subjective: Patient states she feels weak today and declined PT.   Objective: Physical Exam: BP 127/84  Pulse 82  Temp(Src) 98.4 F (36.9 C) (Oral)  Resp 21  Ht 5\' 3"  (1.6 m)  Wt 198 lb 13.7 oz (90.2 kg)  BMI 35.23 kg/m2  SpO2 96%  General: A&Ox3, NAD, appears weak Abd: Soft, minimal tenderness, ND, Left transgluteal perc drain intact, output minimal thin clear yellow in JP bulb, flushed today with some resistance.   Labs: CBC  Recent Labs  08/28/13 0310 08/29/13 0325  WBC 13.7* 14.9*  HGB 10.5* 11.3*  HCT 32.8* 33.7*  PLT 254 322   BMET  Recent Labs  08/28/13 0310 08/29/13 0325  NA 136* 138  K 4.3 3.5*  CL 102 100  CO2 21 24  GLUCOSE 127* 148*  BUN 13 10  CREATININE 0.77 0.68  CALCIUM 8.4 8.3*   LFT  Recent Labs  08/29/13 0325  PROT 6.6  ALBUMIN 2.3*  AST 23  ALT 22  ALKPHOS 92  BILITOT 1.0   PT/INR  Recent Labs  08/28/13 0310 08/29/13 0325  LABPROT 18.0* 17.8*  INR 1.49 1.47     Studies/Results: No results found.  Assessment/Plan: Diverticulitis with abscess S/p perc drain 8/7, Cx E. Coli Minimal thin clear output, afebrile, wbc slightly up, flushed today had slight resistance, recommend TID flushes as ordered (RN unsure if this has been happening)  Repeat CT soon.  Plan per [facility]    LOS: 4 days    Hedy Jacob PA-C 08/29/2013 12:09 PM

## 2013-08-29 NOTE — Progress Notes (Signed)
PARENTERAL NUTRITION CONSULT NOTE - FOLLOW UP  Pharmacy Consult for TPN Indication: Diverticulitis s/p drain, inability to advance diet  Allergies  Allergen Reactions  . Atenolol Nausea And Vomiting  . Clarithromycin Nausea And Vomiting  . Codeine Sulfate Nausea Only  . Macrodantin Other (See Comments)    Long time ago - reaction unknown  . Percocet [Oxycodone-Acetaminophen] Nausea And Vomiting    Patient Measurements: Height: 5\' 3"  (160 cm) Weight: 198 lb 13.7 oz (90.2 kg) IBW/kg (Calculated) : 52.4 Adjusted Body Weight: 62.2 kg  Vital Signs: Temp: 98.2 F (36.8 C) (08/10 0752) Temp src: Oral (08/10 0752) BP: 137/66 mmHg (08/10 0752) Pulse Rate: 69 (08/10 0752) Intake/Output from previous day: 08/09 0701 - 08/10 0700 In: 1855 [P.O.:240; I.V.:800; IV Piggyback:200; TPN:600] Out: 1210 [Urine:1210] Intake/Output from this shift: Total I/O In: -  Out: 250 [Urine:250]  Labs:  Recent Labs  08/27/13 0445 08/28/13 0310 08/29/13 0325  WBC 18.5* 13.7* 14.9*  HGB 10.2* 10.5* 11.3*  HCT 31.7* 32.8* 33.7*  PLT 233 254 322  INR 1.51* 1.49 1.47     Recent Labs  08/27/13 0445 08/28/13 0310 08/29/13 0325  NA 137 136* 138  K 3.5* 4.3 3.5*  CL 102 102 100  CO2 25 21 24   GLUCOSE 129* 127* 148*  BUN 14 13 10   CREATININE 0.88 0.77 0.68  CALCIUM 8.1* 8.4 8.3*  MG  --  2.1 1.7  PHOS  --  2.8 2.6  PROT 6.1  --  6.6  ALBUMIN 2.1*  --  2.3*  AST 16  --  23  ALT 15  --  22  ALKPHOS 70  --  92  BILITOT 0.7  --  1.0  TRIG  --   --  73   Estimated Creatinine Clearance: 54.8 ml/min (by C-G formula based on Cr of 0.68).    Recent Labs  08/28/13 2004 08/28/13 2337 08/29/13 0558  GLUCAP 163* 132* 177*     Insulin Requirements in the past 24 hours:  No SSI ordered (CBGs [phone])  Current Nutrition:  Clear liquid diet. 240cc recorded  Nutritional Goals:  1750-1950 kCal, 110-125 grams of protein per day Goal TPN at 76ml/hr + lipids= 1894 kcal and 99.6g  protein daily.  Admit: Diverticulitis with abscess s/p drainage per IR  GI: Diverticulitis with abscess, drain placed 8/7. Diet advanced to clear liquids 8/9. Having N/V this AM 8/10. Triglycerides 73. Prealbumin pending. (Baseline albumin 3.2 on 08/24/13)  Endo: Synthoid, SSI coverage with TPN initiation  Lytes: K 4.3>>3.5, Mag 2.1>>1.7, Phos 2.6. (Goal K>4 and Mg>2 with afib). Possibly refeeding.   Renal: SCr 0.68.  IV fluids D5NS at kvo.  Pulm: 98% on 2L [place]  Cards: Afib, HTN, dHF - Coumadin on hold and currently anticoagulated with Lovenox 85mg /12h. Plts up to 322. VSS on diltiazem, Toprol XL  Hepatobil: LFTs within normal limits  Neuro/MSK: left open tibial fracture June 2015 with wound vac  ID:  ABX D#[id number] for diverticulitis + abscess with Enterococcus and Gram negative in culture from JP drain. Changed to Zosyn. BC from 8/6 remain NGTD.  Best Practices: Lovenox  TPN Access: PICC line (8/7)  TPN day#: 2 (start date 8/9)  Plan:  1. Increase Clinimix E 5/15 to 52ml/hr + 20% lipid emulsion at 10 ml/hr. 2. Daily multivitamin and trace elements.   3. Change IVF to NS at kvo (removes dextrose) 4. Check CBGs q6h and provide sensitive SSI for coverage 5. CMET, Mag, Phos, Prealbumin, Triglycerides with AM  labs and  weekly on Mondays while on TPN 6. Supplement K+ 68meq IV today for possibly refeeding   Kennidee Heyne S. Alford Highland, PharmD, [facility] Clinical Staff Pharmacist Pager [phone]  08/29/2013, 8:04 AM

## 2013-08-29 NOTE — Progress Notes (Signed)
[facility] TEAM 1 - Stepdown/ICU TEAM Progress Note  [name] IRS:[id number] DOB: 1925-10-27 DOA: 08/25/2013 PCP: Walker Kehr, MD  Admit HPI / Brief Narrative: 80 y.o. female w/ a past medical history of atrial fibrillation on chronic anticoagulation and recent fall which led to an open fracture of the lower extremity requiring surgical intervention and wound care with wound VAC. Patient was in skilled nursing for rehabilitation and started having L lower quadrant abdominal pain.   CT scan of the abdomen was noted for inflammatory changes in the pelvis and right lower quadrant with wall thickening of small bowel adjacent loops concerning for infectious ileitis and findings consistent with a possible ruptured diverticulitis and abscess. General Surgery was called for consultation. Lab work noted white blood cell count 21, anion gap of 16 with lactic acid level of 3.76. Patient was started on IV Cipro and Flagyl.   Significant Events: 8/7 - transgluteal percutaneous drainage in IR 8/7 - PICC line placement 8/8 - begin TNA   HPI/Subjective: Pt has been having difficulty with nusea and vomiting today.  She, however, denies worsening abdom pain/discomfort.  She has been passing gas infrequently but has not had a BM.  She denies cp, or sob.  She became a bit confused last night, early this morning.    Assessment/Plan:  Diverticulitis of colon with perforation As per Gen Surg - s/p L transgluteal percutaneous drainage in IR 8/7 - diet per Gen Surg, anticipate very slow advancement - abscess cultures noting enterococcus which is pan sensitive as well as pan sensitive E coli - ertapenem was changed to zosyn previously as pharmacy suggested ertapenem provides poor enterococcus coverage - cont TNA   Sepsis (infection, WBC 25, HR 112) Hemodynamically stable, though WBC has increased today - follow   Metabolic acidosis resolved w/ volume resuscitation   Chronic afib w/ acute RVR RVR resolved  - with N/V today will transition back to IV meds to avoid RVR - resume coumadin when tolerating oral intake   Chronic diastolic CHF EF 00% on echogram in 2012 - currently euvolemic   UTI Current abx should provide adequate coverage - appears that urine was not sent for cx  HTN BP currently well controlled - follow  Hypothyroid  Cont home synthroid dose  Hx of Fe deficient anemia Fe studies most c/w "anemia of chronic disease" i.e. likely a result of inflammatory state and recent poor nutrition   Macrocytosis B12 normal/high June 2014 - recheck notes adequate stores (658)  Mechanical fall with left open tibial fracture June 2015 Cont current prescribed wound care (followed by Dr. Migdalia Dk) - dressing changed per Plastics today   Obesity Unspecified   Code Status: NO CODE BLUE  Family Communication: spoke w/ daughter at bedside  Disposition Plan: SDU  Consultants: Gen Surgery  IR  Antibiotics: Cipro 8/06 Flagyl 8/06 Ertapenem 8/07 >>8/9 Zosyn 8/9 >>  DVT prophylaxis: IV heparin > lovenox   Objective: Blood pressure 127/84, pulse 82, temperature 98.4 F (36.9 C), temperature source Oral, resp. rate 21, height 5\' 3"  (1.6 m), weight 90.2 kg (198 lb 13.7 oz), SpO2 96.00%.  Intake/Output Summary (Last 24 hours) at 08/29/13 1654 Last data filed at 08/29/13 1510  Gross per 24 hour  Intake 1476.5 ml  Output   1525 ml  Net  -48.5 ml   Exam: General: No acute respiratory distress Lungs: Clear to auscultation bilaterally without wheezes or crackles Cardiovascular: irreg irreg - rate controlled - no appreciable M Abdomen: obese, soft, diffusely  tender - no rebound - no appreciable mass - BS present  Extremities: No significant cyanosis, clubbing, or edema bilateral lower extremities - L LE wound dressed with VAC in place   Data Reviewed: Basic Metabolic Panel:  Recent Labs Lab 08/25/13 1930 08/26/13 0055 08/27/13 0445 08/28/13 0310 08/29/13 0325  NA 138 136* 137  136* 138  K 3.2* 4.0 3.5* 4.3 3.5*  CL 98 98 102 102 100  CO2 26 24 25 21 24   GLUCOSE 111* 121* 129* 127* 148*  BUN 11 10 14 13 10   CREATININE 0.81 0.78 0.88 0.77 0.68  CALCIUM 8.1* 7.9* 8.1* 8.4 8.3*  MG  --   --   --  2.1 1.7  PHOS  --   --   --  2.8 2.6   Liver Function Tests:  Recent Labs Lab 08/25/13 1008 08/26/13 0055 08/27/13 0445 08/29/13 0325  AST 24 23 16 23   ALT 31 21 15 22   ALKPHOS 90 87 70 92  BILITOT 1.2 1.2 0.7 1.0  PROT 7.7 6.4 6.1 6.6  ALBUMIN 3.2* 2.5* 2.1* 2.3*    Recent Labs Lab 08/25/13 1008  LIPASE 13   Coags:  Recent Labs Lab 08/25/13 1008 08/26/13 0055 08/27/13 0445 08/28/13 0310 08/29/13 0325  INR 1.49 1.60* 1.51* 1.49 1.47   No results found for this basename: PTT,  in the last 168 hours  CBC:  Recent Labs Lab 08/25/13 1930 08/26/13 0055 08/27/13 0445 08/28/13 0310 08/29/13 0325  WBC 24.9* 24.9* 18.5* 13.7* 14.9*  NEUTROABS  --   --   --   --  12.8*  HGB 12.7 11.1* 10.2* 10.5* 11.3*  HCT 38.7 33.4* 31.7* 32.8* 33.7*  MCV 109.3* 106.0* 106.7* 106.5* 106.6*  PLT 279 232 233 254 322   CBG:  Recent Labs Lab 08/28/13 2004 08/28/13 2337 08/29/13 0558 08/29/13 1152  GLUCAP 163* 132* 177* 156*    Recent Results (from the past 240 hour(s))  ANAEROBIC CULTURE     Status: None   Collection Time    08/22/13  1:41 PM      Result Value Ref Range Status   Specimen Description LEG LEFT BONE CHIP   Final   Special Requests     Final   Value: PATIENT ON FOLLOWING CIPRO 400 MG PRIOR TO CASE STARTING   Gram Stain     Final   Value: NO WBC SEEN     NO SQUAMOUS EPITHELIAL CELLS SEEN     NO ORGANISMS SEEN     Performed at [facility]   Culture     Final   Value: NO ANAEROBES ISOLATED     Performed at [facility]   Report Status 08/27/2013 FINAL   Final  TISSUE CULTURE     Status: None   Collection Time    08/22/13  1:41 PM      Result Value Ref Range Status   Specimen Description LEG LEFT BONE CHIP    Final   Special Requests     Final   Value: PATIENT ON FOLLOWING CIPRO 400 MG PRIOR TO CASE STARTING   Gram Stain     Final   Value: NO WBC SEEN     NO SQUAMOUS EPITHELIAL CELLS SEEN     NO ORGANISMS SEEN     Performed at [facility]   Culture     Final   Value: FEW STAPHYLOCOCCUS AUREUS     Note: RIFAMPIN AND GENTAMICIN SHOULD NOT BE USED  AS SINGLE DRUGS FOR TREATMENT OF STAPH INFECTIONS.     Performed at [facility]   Report Status 08/25/2013 FINAL   Final   Organism ID, Bacteria STAPHYLOCOCCUS AUREUS   Final  CULTURE, BLOOD (ROUTINE X 2)     Status: None   Collection Time    08/25/13  3:05 PM      Result Value Ref Range Status   Specimen Description BLOOD LEFT FOREARM   Final   Special Requests BOTTLES DRAWN AEROBIC ONLY 5CC   Final   Culture  Setup Time     Final   Value: 08/25/2013 18:56     Performed at [facility]   Culture     Final   Value:        BLOOD CULTURE RECEIVED NO GROWTH TO DATE CULTURE WILL BE HELD FOR 5 DAYS BEFORE ISSUING A FINAL NEGATIVE REPORT     Performed at [facility]   Report Status PENDING   Incomplete  CULTURE, BLOOD (ROUTINE X 2)     Status: None   Collection Time    08/25/13  3:45 PM      Result Value Ref Range Status   Specimen Description BLOOD LEFT FOREARM   Final   Special Requests BOTTLES DRAWN AEROBIC ONLY 5CC   Final   Culture  Setup Time     Final   Value: 08/25/2013 20:08     Performed at [facility]   Culture     Final   Value:        BLOOD CULTURE RECEIVED NO GROWTH TO DATE CULTURE WILL BE HELD FOR 5 DAYS BEFORE ISSUING A FINAL NEGATIVE REPORT     Performed at [facility]   Report Status PENDING   Incomplete  MRSA PCR SCREENING     Status: None   Collection Time    08/25/13  6:02 PM      Result Value Ref Range Status   MRSA by PCR NEGATIVE  NEGATIVE Final   Comment:            The GeneXpert MRSA Assay (FDA     approved for NASAL specimens     only), is one component of a      comprehensive MRSA colonization     surveillance program. It is not     intended to diagnose MRSA     infection nor to guide or     monitor treatment for     MRSA infections.  SURGICAL PCR SCREEN     Status: None   Collection Time    08/26/13 10:27 AM      Result Value Ref Range Status   MRSA, PCR NEGATIVE  NEGATIVE Final   Staphylococcus aureus NEGATIVE  NEGATIVE Final   Comment:            The Xpert SA Assay (FDA     approved for NASAL specimens     in patients over 37 years of age),     is one component of     a comprehensive surveillance     program.  Test performance has     been validated by [employer] for patients greater     than or equal to 51 year old.     It is not intended     to diagnose infection nor to     guide or monitor treatment.  CULTURE, ROUTINE-ABSCESS     Status: None  Collection Time    08/26/13  2:04 PM      Result Value Ref Range Status   Specimen Description BUTTOCKS LEFT   Final   Special Requests LEFT UPPER BUTTOCK JP DRAIN   Final   Gram Stain     Final   Value: ABUNDANT WBC PRESENT,BOTH PMN AND MONONUCLEAR     NO SQUAMOUS EPITHELIAL CELLS SEEN     ABUNDANT GRAM POSITIVE RODS     ABUNDANT GRAM NEGATIVE RODS     FEW GRAM POSITIVE COCCI   Culture     Final   Value: MODERATE ESCHERICHIA COLI     MODERATE ENTEROCOCCUS SPECIES     Performed at [facility]   Report Status 08/29/2013 FINAL   Final   Organism ID, Bacteria ESCHERICHIA COLI   Final   Organism ID, Bacteria ENTEROCOCCUS SPECIES   Final  ANAEROBIC CULTURE     Status: None   Collection Time    08/26/13  2:04 PM      Result Value Ref Range Status   Specimen Description ABSCESS BUTTOCKS LEFT   Final   Special Requests LEFT UPPER BUTTOCK JP DRAIN   Final   Gram Stain     Final   Value: ABUNDANT WBC PRESENT,BOTH PMN AND MONONUCLEAR     NO SQUAMOUS EPITHELIAL CELLS SEEN     ABUNDANT GRAM POSITIVE RODS     ABUNDANT GRAM NEGATIVE RODS     FEW GRAM POSITIVE COCCI    Culture     Final   Value: NO ANAEROBES ISOLATED; CULTURE IN PROGRESS FOR 5 DAYS     Performed at [facility]   Report Status PENDING   Incomplete     Studies:  Recent x-ray studies have been reviewed in detail by the Attending Physician  Scheduled Meds:  Scheduled Meds: . enoxaparin (LOVENOX) injection  85 mg Subcutaneous Q12H  . insulin aspart  0-9 Units Subcutaneous 4 times per day  . [START ON 08/30/2013] levothyroxine  12.5 mcg Intravenous Daily  . metoprolol  5 mg Intravenous 4 times per day  . piperacillin-tazobactam (ZOSYN)  IV  3.375 g Intravenous 3 times per day  . temazepam  30 mg Oral QHS    Time spent on care of this patient: 35 mins   Anurag Scarfo T , MD   [facility] Office  [phone] Pager - Text Page per Shea Evans as per below:  On-Call/Text Page:      [email or web address]      password [email or web address]  If 7PM-7AM, please contact night-coverage [email or web address] Password [email or web address] 08/29/2013, 4:54 PM   LOS: 4 days

## 2013-08-29 NOTE — Evaluation (Signed)
Physical Therapy Evaluation Patient Details Name: [name] MRN: [id number] DOB: February 14, 1925 Today's Date: 08/29/2013   History of Present Illness  Pt is an 80 y/o female admitted from SNF with abdominal pain. Was found to be septic and pt is s/p left pelvic/diverticular abscess drainage 8/7. PMH includes fall with open fracture on LLE 1 month ago. Pt was at [facility] s/p surgical intervention with wound vac. Pt states she is TDWB however MD note states NWB.   Clinical Impression  Pt admitted with the above. Pt currently with functional limitations due to the deficits listed below (see PT Problem List). At the time of PT eval pt experiencing nausea with some vomiting when sitting EOB. Mobility limited due to this. PT assisted pt in rolling and bed mobility for proper positioning and so RN could change posterior bandage. Pt will benefit from skilled PT to increase their independence and safety with mobility to allow discharge to the venue listed below.       Follow Up Recommendations SNF;Supervision/Assistance - 24 hour    Equipment Recommendations  None recommended by PT    Recommendations for Other Services       Precautions / Restrictions Precautions Precautions: Fall Precaution Comments: Wound vac, JP drain Restrictions Weight Bearing Restrictions: Yes LLE Weight Bearing: Non weight bearing      Mobility  Bed Mobility Overal bed mobility: Needs Assistance;+ 2 for safety/equipment Bed Mobility: Rolling;Sidelying to Sit Rolling: Min assist Sidelying to sit: Min assist       General bed mobility comments: VC's for sequencing and safety awareness. Pt was able to come to full sitting on EOB with minimal assistance, however increased time and VC's required to scoot out to EOB and rest feet on floor.  Transfers                 General transfer comment: Unable due to N/V  Ambulation/Gait                Stairs            Wheelchair Mobility    Modified  Rankin (Stroke Patients Only)       Balance Overall balance assessment: Needs assistance Sitting-balance support: Feet supported;Bilateral upper extremity supported Sitting balance-Leahy Scale: Poor Sitting balance - Comments: Pt with abdominal pain and required UE assist to maintain balance at EOB.                                      Pertinent Vitals/Pain Pain Assessment: 0-10 Pain Score: 4  Pain Location: Stomach (No pain in LLE) Pain Intervention(s): Limited activity within patient's tolerance    Home Living Family/patient expects to be discharged to:: Skilled nursing facility                 Additional Comments: [facility]    Prior Function Level of Independence: Needs assistance   Gait / Transfers Assistance Needed: +1 assist per pt for transfers with RW  ADL's / Homemaking Assistance Needed: Able to do some ADL activities without assist        Hand Dominance   Dominant Hand: Right    Extremity/Trunk Assessment   Upper Extremity Assessment: Defer to OT evaluation           Lower Extremity Assessment: LLE deficits/detail   LLE Deficits / Details: Decreased strength and AROM consistent with previous surgery. Wound vac present, and pt in  pressure relieving boot.  Cervical / Trunk Assessment: Normal  Communication   Communication: HOH  Cognition Arousal/Alertness: Awake/alert Behavior During Therapy: Anxious Overall Cognitive Status: Impaired/Different from baseline Area of Impairment: Memory;Problem solving     Memory: Decreased short-term memory       Problem Solving: Slow processing;Decreased initiation;Requires verbal cues;Difficulty sequencing General Comments: Pt giving false information during history interview and daughter who was present in room states she has been confused.     General Comments      Exercises        Assessment/Plan    PT Assessment Patient needs continued PT services  PT Diagnosis  Difficulty walking;Generalized weakness   PT Problem List Decreased strength;Decreased range of motion;Decreased activity tolerance;Decreased balance;Decreased mobility;Decreased knowledge of use of DME;Decreased knowledge of precautions;Decreased safety awareness;Cardiopulmonary status limiting activity;Pain  PT Treatment Interventions DME instruction;Gait training;Functional mobility training;Therapeutic activities;Therapeutic exercise;Neuromuscular re-education;Patient/family education;Wheelchair mobility training   PT Goals (Current goals can be found in the Care Plan section) Acute Rehab PT Goals Patient Stated Goal: To return to [facility] at d/c for continued rehab PT Goal Formulation: With patient/family Time For Goal Achievement: 09/12/13 Potential to Achieve Goals: Good    Frequency Min 2X/week   Barriers to discharge        Co-evaluation               End of Session   Activity Tolerance: Treatment limited secondary to medical complications (Comment) (N/V) Patient left: in bed;with call bell/phone within reach;with family/visitor present Nurse Communication: Mobility status         Time: 5573-2202 PT Time Calculation (min): 34 min   Charges:   PT Evaluation $Initial PT Evaluation Tier I: 1 Procedure PT Treatments $Therapeutic Activity: 23-37 mins   PT G CodesJolyn Lent 08/29/2013, 11:34 AM  Jolyn Lent, PT, DPT Acute Rehabilitation Services Pager: [phone]

## 2013-08-29 NOTE — Progress Notes (Signed)
Pt confused to place.  Had same confusion last night and earlier however pt wanted to call Daughter. Daughter called and pt more at ease.  Daughter on way.  Vs stable. Will continue to monitor. Saunders Revel T

## 2013-08-29 NOTE — Progress Notes (Signed)
Patient ID: [name], female   DOB: [birth year], 80 y.o.   MRN: [id number]     [facility]      [address] [place], [place] [address]    Phone: [phone] FAX: [phone]     Subjective: Passing flatus.  Much less pain today.  WBC slightly up 13.7--->14.9K.  She is now getting confused.  Daughter states she takes restoril QHS, has not had any benzos.    Objective:  Vital signs:  Filed Vitals:   08/29/13 0331 08/29/13 0533 08/29/13 0630 08/29/13 0752  BP: 150/73 138/98 155/104 137/66  Pulse:    69  Temp: 98.6 F (37 C)  98.1 F (36.7 C) 98.2 F (36.8 C)  TempSrc: Oral  Oral Oral  Resp: $Remo'20  27 22  'NdIKF$ Height:      Weight: 198 lb 13.7 oz (90.2 kg)     SpO2: 96%  97% 98%    Last BM Date: 08/25/13  Intake/Output   Yesterday:  08/09 0701 - 08/10 0700 In: 1855 [P.O.:240; I.V.:800; IV Piggyback:200; TPN:600] Out: 1210 [Urine:1210] This shift:  Total I/O In: -  Out: 250 [Urine:250]   Physical Exam:  General: Pt awake/alert/oriented x4 in no acute distress  Chest: cta. No chest wall pain w good excursion  CV: S1S2 regularly irregular Abdomen: Soft. Nondistended. Minimal TTP to RLQ. No evidence of peritonitis. No incarcerated hernias. Left gluteal drain with serous cloudy output(0 recorded yesterday).  Ext: RLE dressing, PRAFO boot. No mjr edema. No cyanosis   Problem List:   Principal Problem:   Sepsis Active Problems:   HYPOTHYROIDISM   Iron deficiency anemia, unspecified   HYPERTENSION   Atrial flutter   Unspecified constipation   Metabolic acidosis   Diverticulitis of colon with perforation    Results:   Labs: Results for orders placed during the hospital encounter of 08/25/13 (from the past 48 hour(s))  HEPARIN LEVEL (UNFRACTIONATED)     Status: Abnormal   Collection Time    08/27/13  4:25 PM      Result Value Ref Range   Heparin Unfractionated 1.14 (*) 0.30 - 0.70 IU/mL   Comment:          IF HEPARIN RESULTS ARE BELOW     EXPECTED VALUES, AND PATIENT     DOSAGE HAS BEEN CONFIRMED,     SUGGEST FOLLOW UP TESTING     OF ANTITHROMBIN III LEVELS.     RESULTS CONFIRMED BY MANUAL DILUTION  HEPARIN LEVEL (UNFRACTIONATED)     Status: Abnormal   Collection Time    08/27/13  6:21 PM      Result Value Ref Range   Heparin Unfractionated <0.10 (*) 0.30 - 0.70 IU/mL   Comment:            IF HEPARIN RESULTS ARE BELOW     EXPECTED VALUES, AND PATIENT     DOSAGE HAS BEEN CONFIRMED,     SUGGEST FOLLOW UP TESTING     OF ANTITHROMBIN III LEVELS.  PROTIME-INR     Status: Abnormal   Collection Time    08/28/13  3:10 AM      Result Value Ref Range   Prothrombin Time 18.0 (*) 11.6 - 15.2 seconds   INR 1.49  0.00 - 1.49  CBC     Status: Abnormal   Collection Time    08/28/13  3:10 AM      Result Value Ref Range   WBC 13.7 (*) 4.0 -  10.5 K/uL   RBC 3.08 (*) 3.87 - 5.11 MIL/uL   Hemoglobin 10.5 (*) 12.0 - 15.0 g/dL   HCT 32.8 (*) 36.0 - 46.0 %   MCV 106.5 (*) 78.0 - 100.0 fL   MCH 34.1 (*) 26.0 - 34.0 pg   MCHC 32.0  30.0 - 36.0 g/dL   RDW 13.1  11.5 - 15.5 %   Platelets 254  150 - 400 K/uL  HEPARIN LEVEL (UNFRACTIONATED)     Status: None   Collection Time    08/28/13  3:10 AM      Result Value Ref Range   Heparin Unfractionated 0.41  0.30 - 0.70 IU/mL   Comment:            IF HEPARIN RESULTS ARE BELOW     EXPECTED VALUES, AND PATIENT     DOSAGE HAS BEEN CONFIRMED,     SUGGEST FOLLOW UP TESTING     OF ANTITHROMBIN III LEVELS.  MAGNESIUM     Status: None   Collection Time    08/28/13  3:10 AM      Result Value Ref Range   Magnesium 2.1  1.5 - 2.5 mg/dL  PHOSPHORUS     Status: None   Collection Time    08/28/13  3:10 AM      Result Value Ref Range   Phosphorus 2.8  2.3 - 4.6 mg/dL  BASIC METABOLIC PANEL     Status: Abnormal   Collection Time    08/28/13  3:10 AM      Result Value Ref Range   Sodium 136 (*) 137 - 147 mEq/L   Potassium 4.3  3.7 - 5.3 mEq/L   Comment:  DELTA CHECK NOTED   Chloride 102  96 - 112 mEq/L   CO2 21  19 - 32 mEq/L   Glucose, Bld 127 (*) 70 - 99 mg/dL   BUN 13  6 - 23 mg/dL   Creatinine, Ser 0.77  0.50 - 1.10 mg/dL   Calcium 8.4  8.4 - 10.5 mg/dL   GFR calc non Af Amer 74 (*) >90 mL/min   GFR calc Af Amer 86 (*) >90 mL/min   Comment: (NOTE)     The eGFR has been calculated using the CKD EPI equation.     This calculation has not been validated in all clinical situations.     eGFR's persistently <90 mL/min signify possible Chronic Kidney     Disease.   Anion gap 13  5 - 15  GLUCOSE, CAPILLARY     Status: Abnormal   Collection Time    08/28/13  8:04 PM      Result Value Ref Range   Glucose-Capillary 163 (*) 70 - 99 mg/dL  GLUCOSE, CAPILLARY     Status: Abnormal   Collection Time    08/28/13 11:37 PM      Result Value Ref Range   Glucose-Capillary 132 (*) 70 - 99 mg/dL  PROTIME-INR     Status: Abnormal   Collection Time    08/29/13  3:25 AM      Result Value Ref Range   Prothrombin Time 17.8 (*) 11.6 - 15.2 seconds   INR 1.47  0.00 - 1.49  CBC     Status: Abnormal   Collection Time    08/29/13  3:25 AM      Result Value Ref Range   WBC 14.9 (*) 4.0 - 10.5 K/uL   RBC 3.16 (*) 3.87 - 5.11 MIL/uL   Hemoglobin 11.3 (*)  12.0 - 15.0 g/dL   HCT 33.7 (*) 36.0 - 46.0 %   MCV 106.6 (*) 78.0 - 100.0 fL   MCH 35.8 (*) 26.0 - 34.0 pg   MCHC 33.5  30.0 - 36.0 g/dL   RDW 12.9  11.5 - 15.5 %   Platelets 322  150 - 400 K/uL  COMPREHENSIVE METABOLIC PANEL     Status: Abnormal   Collection Time    08/29/13  3:25 AM      Result Value Ref Range   Sodium 138  137 - 147 mEq/L   Potassium 3.5 (*) 3.7 - 5.3 mEq/L   Comment: DELTA CHECK NOTED   Chloride 100  96 - 112 mEq/L   CO2 24  19 - 32 mEq/L   Glucose, Bld 148 (*) 70 - 99 mg/dL   BUN 10  6 - 23 mg/dL   Creatinine, Ser 0.68  0.50 - 1.10 mg/dL   Calcium 8.3 (*) 8.4 - 10.5 mg/dL   Total Protein 6.6  6.0 - 8.3 g/dL   Albumin 2.3 (*) 3.5 - 5.2 g/dL   AST 23  0 - 37 U/L   ALT  22  0 - 35 U/L   Alkaline Phosphatase 92  39 - 117 U/L   Total Bilirubin 1.0  0.3 - 1.2 mg/dL   GFR calc non Af Amer 78 (*) >90 mL/min   GFR calc Af Amer 90 (*) >90 mL/min   Comment: (NOTE)     The eGFR has been calculated using the CKD EPI equation.     This calculation has not been validated in all clinical situations.     eGFR's persistently <90 mL/min signify possible Chronic Kidney     Disease.   Anion gap 14  5 - 15  MAGNESIUM     Status: None   Collection Time    08/29/13  3:25 AM      Result Value Ref Range   Magnesium 1.7  1.5 - 2.5 mg/dL  PHOSPHORUS     Status: None   Collection Time    08/29/13  3:25 AM      Result Value Ref Range   Phosphorus 2.6  2.3 - 4.6 mg/dL  DIFFERENTIAL     Status: Abnormal   Collection Time    08/29/13  3:25 AM      Result Value Ref Range   Neutrophils Relative % 85 (*) 43 - 77 %   Neutro Abs 12.8 (*) 1.7 - 7.7 K/uL   Lymphocytes Relative 6 (*) 12 - 46 %   Lymphs Abs 0.9  0.7 - 4.0 K/uL   Monocytes Relative 8  3 - 12 %   Monocytes Absolute 1.1 (*) 0.1 - 1.0 K/uL   Eosinophils Relative 1  0 - 5 %   Eosinophils Absolute 0.2  0.0 - 0.7 K/uL   Basophils Relative 0  0 - 1 %   Basophils Absolute 0.0  0.0 - 0.1 K/uL  TRIGLYCERIDES     Status: None   Collection Time    08/29/13  3:25 AM      Result Value Ref Range   Triglycerides 73  <150 mg/dL  GLUCOSE, CAPILLARY     Status: Abnormal   Collection Time    08/29/13  5:58 AM      Result Value Ref Range   Glucose-Capillary 177 (*) 70 - 99 mg/dL    Imaging / Studies: Dg Chest Port 1 View  08/27/2013   CLINICAL DATA:  80 year old  female - PICC line placement.  EXAM: PORTABLE CHEST - 1 VIEW  COMPARISON:  08/26/2013 and prior chest radiographs dating back to 12/29/2008  FINDINGS: Cardiomegaly identified.  The right PICC line is present with tip overlying the superior cavoatrial junction.  Increasing subsegmental right basilar atelectasis noted.  There is no evidence of focal airspace disease,  pulmonary edema, suspicious pulmonary nodule/mass, pleural effusion, or pneumothorax. No acute bony abnormalities are identified.  Degenerative changes in the shoulders again identified.  IMPRESSION: Right PICC line with tip overlying the superior cavoatrial junction.  Increasing subsegmental right basilar atelectasis.   Electronically Signed   By: Hassan Rowan M.D.   On: 08/27/2013 12:06    Medications / Allergies:  Scheduled Meds: . diltiazem  60 mg Oral 4 times per day  . enoxaparin (LOVENOX) injection  85 mg Subcutaneous Q12H  . insulin aspart  0-9 Units Subcutaneous 4 times per day  . levothyroxine  25 mcg Oral QAC breakfast  . metoprolol succinate  50 mg Oral BID  . piperacillin-tazobactam (ZOSYN)  IV  3.375 g Intravenous 3 times per day   Continuous Infusions: . dextrose 5 % and 0.9% NaCl 10 mL/hr at 08/28/13 1956  . Marland KitchenTPN (CLINIMIX-E) Adult 40 mL/hr at 08/28/13 1956   And  . fat emulsion 240 mL (08/28/13 1956)   PRN Meds:.LORazepam, morphine injection, ondansetron (ZOFRAN) IV, ondansetron, sodium chloride  Antibiotics: Anti-infectives   Start     Dose/Rate Route Frequency Ordered Stop   08/28/13 1400  piperacillin-tazobactam (ZOSYN) IVPB 3.375 g     3.375 g 12.5 mL/hr over 240 Minutes Intravenous 3 times per day 08/28/13 1126     08/26/13 0800  ertapenem (INVANZ) 1 g in sodium chloride 0.9 % 50 mL IVPB  Status:  Discontinued     1 g 100 mL/hr over 30 Minutes Intravenous Every 24 hours 08/26/13 0747 08/28/13 1126   08/26/13 0200  ciprofloxacin (CIPRO) IVPB 400 mg  Status:  Discontinued     400 mg 200 mL/hr over 60 Minutes Intravenous Every 12 hours 08/25/13 1646 08/26/13 0747   08/25/13 1645  metroNIDAZOLE (FLAGYL) IVPB 500 mg  Status:  Discontinued     500 mg 100 mL/hr over 60 Minutes Intravenous Every 8 hours 08/25/13 1636 08/26/13 0747   08/25/13 1345  ciprofloxacin (CIPRO) IVPB 400 mg     400 mg 200 mL/hr over 60 Minutes Intravenous  Once 08/25/13 1344 08/25/13 1515    08/25/13 1345  metroNIDAZOLE (FLAGYL) IVPB 500 mg     500 mg 100 mL/hr over 60 Minutes Intravenous  Once 08/25/13 1344 08/25/13 1554      Assessment/Plan  (Hospital problem)  Atrial flutter-heparin/cardizem  HTN  Hypothyroidism  IDA  Chronic diastolic heart failure   (Principal problem)  sepsis  Abdominal pain, RLQ inflammatory process  Diverticulitis with abscess  -s/p IR drain placement 8/7  -she is slowly improving, we will progress her very slowly.  She has a resolving ileus, leave on clears for now. -Cx GNR  -Invanz changed to Zosyn 8/9  -IVF  -continue with conservative management, hopefully we can avoid surgery. We will consider repeat CT scan in a few days(last CT 8/6)   Erby Pian, [facility] Pager [phone] Office [phone]  08/29/2013 8:12 AM

## 2013-08-29 NOTE — Progress Notes (Signed)
Notified Md about pt nausea vomiting. Bile looking emesis. BS hypo. Vs stable.  Daughter at bedside.  Pt states " feels better".  Had given zofran earlier and not time to give.  Will continue to monitor. Saunders Revel T

## 2013-08-29 NOTE — Care Management Note (Addendum)
    Page 1 of 1   09/08/2013     11:35:55 AM CARE MANAGEMENT NOTE 09/08/2013  Patient:  [name] [name], [name]   Account Number:  [id number]  Date Initiated:  08/29/2013  Documentation initiated by:  [name] [name] [name]  Subjective/Objective Assessment:   adm w sepsis     Action/Plan:   lives w husband, pcp dr pltonikov   Anticipated DC Date:  09/08/2013   Anticipated DC Plan:  ASSISTED LIVING / REST HOME  In-house referral  Clinical Social Worker         Choice offered to / List presented to:             Status of service:   Medicare Important Message given?  YES (If response is "NO", the following Medicare IM given date fields will be blank) Date Medicare IM given:  09/05/2013 Medicare IM given by:  [name] [name] [name] Date Additional Medicare IM given:  09/01/2013 Additional Medicare IM given by:  [facility] [name] [name]  Discharge Disposition:    Per UR Regulation:  Reviewed for med. necessity/level of care/duration of stay  If discussed at [place] of Stay Meetings, dates discussed:   08/30/2013  09/01/2013  09/06/2013  09/08/2013    Comments:  09-08-13 Gave and discussed IM to patient and daughter , both voiced understanding. [name] Spatz RN BSN [phone]   09-06-13 [facility] called , both have bed for patinet today . Spoke with [facility] , plan is to advance diet to soft wean TNA and discharge to [facility] . Both LTACH's aware.  Paged attending. [name] Spatz RN BSN [phone]

## 2013-08-29 NOTE — Progress Notes (Signed)
Subjective: VAC dressing change done today. Patient tolerated it well and did not require medication for dressing change.  The wound is incorporating the Acell well and there is still a thin layer sutured to the wound surface. The wound measures 7.5 x 3.0 cm with the majority appearing flush with the surrounding skin.  The VAC dressing was re-applied to the area over adaptic and surgical lubricant.   Objective: Vital signs in last 24 hours: Temp:  [98.1 F (36.7 C)-98.6 F (37 C)] 98.4 F (36.9 C) (08/10 1150) Pulse Rate:  [69-82] 82 (08/10 1150) Resp:  [19-27] 21 (08/10 1150) BP: (98-155)/(53-104) 127/84 mmHg (08/10 1150) SpO2:  [94 %-98 %] 96 % (08/10 1150) Weight:  [90.2 kg (198 lb 13.7 oz)] 90.2 kg (198 lb 13.7 oz) (08/10 0331) Last BM Date: 08/25/13  Intake/Output from previous day: 08/09 0701 - 08/10 0700 In: 1855 [P.O.:240; I.V.:800; IV Piggyback:200; TPN:600] Out: 1210 [Urine:1210] Intake/Output this shift: Total I/O In: -  Out: 250 [Urine:250]  General appearance: alert, cooperative, no distress and slowed mentation The right lower leg wound -The wound is incorporating the Acell well and there is still a thin layer sutured to the wound surface. The wound measures 7.5 x 3.0 cm with the majority appearing flush with the surrounding skin.   Lab Results:   Recent Labs  08/28/13 0310 08/29/13 0325  WBC 13.7* 14.9*  HGB 10.5* 11.3*  HCT 32.8* 33.7*  PLT 254 322   BMET  Recent Labs  08/28/13 0310 08/29/13 0325  NA 136* 138  K 4.3 3.5*  CL 102 100  CO2 21 24  GLUCOSE 127* 148*  BUN 13 10  CREATININE 0.77 0.68  CALCIUM 8.4 8.3*   PT/INR  Recent Labs  08/28/13 0310 08/29/13 0325  LABPROT 18.0* 17.8*  INR 1.49 1.47   ABG No results found for this basename: PHART, PCO2, PO2, HCO3,  in the last 72 hours  Studies/Results: No results found.  Anti-infectives: Anti-infectives   Start     Dose/Rate Route Frequency Ordered Stop   08/28/13 1400   piperacillin-tazobactam (ZOSYN) IVPB 3.375 g     3.375 g 12.5 mL/hr over 240 Minutes Intravenous 3 times per day 08/28/13 1126     08/26/13 0800  ertapenem (INVANZ) 1 g in sodium chloride 0.9 % 50 mL IVPB  Status:  Discontinued     1 g 100 mL/hr over 30 Minutes Intravenous Every 24 hours 08/26/13 0747 08/28/13 1126   08/26/13 0200  ciprofloxacin (CIPRO) IVPB 400 mg  Status:  Discontinued     400 mg 200 mL/hr over 60 Minutes Intravenous Every 12 hours 08/25/13 1646 08/26/13 0747   08/25/13 1645  metroNIDAZOLE (FLAGYL) IVPB 500 mg  Status:  Discontinued     500 mg 100 mL/hr over 60 Minutes Intravenous Every 8 hours 08/25/13 1636 08/26/13 0747   08/25/13 1345  ciprofloxacin (CIPRO) IVPB 400 mg     400 mg 200 mL/hr over 60 Minutes Intravenous  Once 08/25/13 1344 08/25/13 1515   08/25/13 1345  metroNIDAZOLE (FLAGYL) IVPB 500 mg     500 mg 100 mL/hr over 60 Minutes Intravenous  Once 08/25/13 1344 08/25/13 1554      Assessment/Plan:  POD #7- Status post I&D and placement of Acell and VAC placement Continue VAC dressing over the Acell. Will plan to follow up in the [facility] if discharged later this week. Otherwise, will continue to follow here in hospital, but will not need another VAC dressing change  until next Monday.   LOS: 4 days   Helga Asbury,PA-C Plastic Surgery [phone]

## 2013-08-29 NOTE — Progress Notes (Signed)
Pharmacy: Lovenox  87yof continues on lovenox for afib while coumadin remains on hold. Weight has increased ~5kg today so will adjust dose accordingly. Renal function and CBC are both stable.  Plan: 1) Change lovenox to 90mg  sq q12 2) Resume coumadin when able  Nena Jordan, PharmD, BCPS 08/29/2013, 12:37 PM

## 2013-08-30 DIAGNOSIS — I10 Essential (primary) hypertension: Secondary | ICD-10-CM

## 2013-08-30 DIAGNOSIS — N390 Urinary tract infection, site not specified: Secondary | ICD-10-CM

## 2013-08-30 DIAGNOSIS — S8290XD Unspecified fracture of unspecified lower leg, subsequent encounter for closed fracture with routine healing: Secondary | ICD-10-CM

## 2013-08-30 DIAGNOSIS — I509 Heart failure, unspecified: Secondary | ICD-10-CM

## 2013-08-30 DIAGNOSIS — I5032 Chronic diastolic (congestive) heart failure: Secondary | ICD-10-CM

## 2013-08-30 DIAGNOSIS — K631 Perforation of intestine (nontraumatic): Secondary | ICD-10-CM

## 2013-08-30 DIAGNOSIS — K5732 Diverticulitis of large intestine without perforation or abscess without bleeding: Secondary | ICD-10-CM

## 2013-08-30 DIAGNOSIS — I4891 Unspecified atrial fibrillation: Secondary | ICD-10-CM

## 2013-08-30 DIAGNOSIS — E876 Hypokalemia: Secondary | ICD-10-CM

## 2013-08-30 LAB — CBC
HCT: 38.6 % (ref 36.0–46.0)
Hemoglobin: 12.9 g/dL (ref 12.0–15.0)
MCH: 34.4 pg — ABNORMAL HIGH (ref 26.0–34.0)
MCHC: 33.4 g/dL (ref 30.0–36.0)
MCV: 102.9 fL — ABNORMAL HIGH (ref 78.0–100.0)
Platelets: 338 10*3/uL (ref 150–400)
RBC: 3.75 MIL/uL — ABNORMAL LOW (ref 3.87–5.11)
RDW: 12.5 % (ref 11.5–15.5)
WBC: 12.7 10*3/uL — ABNORMAL HIGH (ref 4.0–10.5)

## 2013-08-30 LAB — BASIC METABOLIC PANEL
Anion gap: 12 (ref 5–15)
BUN: 9 mg/dL (ref 6–23)
CO2: 29 mEq/L (ref 19–32)
Calcium: 8.3 mg/dL — ABNORMAL LOW (ref 8.4–10.5)
Chloride: 95 mEq/L — ABNORMAL LOW (ref 96–112)
Creatinine, Ser: 0.56 mg/dL (ref 0.50–1.10)
GFR calc Af Amer: >90 mL/min (ref >90)
GFR calc non Af Amer: 83 mL/min — ABNORMAL LOW (ref >90)
Glucose, Bld: 159 mg/dL — ABNORMAL HIGH (ref 70–99)
Potassium: 3.2 mEq/L — ABNORMAL LOW (ref 3.7–5.3)
Sodium: 136 mEq/L — ABNORMAL LOW (ref 137–147)

## 2013-08-30 LAB — GLUCOSE, CAPILLARY
Glucose-Capillary: 168 mg/dL — ABNORMAL HIGH (ref 70–99)
Glucose-Capillary: 150 mg/dL — ABNORMAL HIGH (ref 70–99)
Glucose-Capillary: 87 mg/dL (ref 70–99)

## 2013-08-30 LAB — PROTIME-INR
INR: 1.26 (ref 0.00–1.49)
Prothrombin Time: 15.8 seconds — ABNORMAL HIGH (ref 11.6–15.2)

## 2013-08-30 LAB — MAGNESIUM
Magnesium: 1.6 mg/dL (ref 1.5–2.5)
Magnesium: 2 mg/dL (ref 1.5–2.5)

## 2013-08-30 LAB — CLOSTRIDIUM DIFFICILE BY PCR: Toxigenic C. Difficile by PCR: NEGATIVE

## 2013-08-30 LAB — POTASSIUM: Potassium: 3.9 mEq/L (ref 3.7–5.3)

## 2013-08-30 MED ORDER — POTASSIUM CHLORIDE 10 MEQ/50ML IV SOLN
10.0000 meq | INTRAVENOUS | Status: AC
  Administered 2013-08-30 (×5): 10 meq via INTRAVENOUS
  Filled 2013-08-30: qty 50

## 2013-08-30 MED ORDER — TRACE MINERALS CR-CU-F-FE-I-MN-MO-SE-ZN IV SOLN
INTRAVENOUS | Status: AC
  Administered 2013-08-30: 19:00:00 via INTRAVENOUS
  Filled 2013-08-30: qty 2000

## 2013-08-30 MED ORDER — FAT EMULSION 20 % IV EMUL
240.0000 mL | INTRAVENOUS | Status: AC
  Administered 2013-08-30: 240 mL via INTRAVENOUS
  Filled 2013-08-30: qty 250

## 2013-08-30 MED ORDER — MAGNESIUM SULFATE 40 MG/ML IJ SOLN
2.0000 g | Freq: Once | INTRAMUSCULAR | Status: AC
  Administered 2013-08-30: 2 g via INTRAVENOUS
  Filled 2013-08-30: qty 50

## 2013-08-30 MED ORDER — IOHEXOL 300 MG/ML  SOLN: 25.0000 mL | INTRAMUSCULAR | Status: AC

## 2013-08-30 NOTE — Progress Notes (Signed)
[place] TEAM 1 - Stepdown/ICU TEAM Progress Note  [name] YTK:[id number] DOB: September 30, 1925 DOA: 08/25/2013 PCP: Walker Kehr, MD  Admit HPI / Brief Narrative: 80 y.o. WF PMHx atrial fibrillation on chronic anticoagulation and recent fall which led to an open fracture of the lower extremity requiring surgical intervention and wound care with wound VAC. Patient was in skilled nursing for rehabilitation and started having L lower quadrant abdominal pain.  CT scan of the abdomen was noted for inflammatory changes in the pelvis and right lower quadrant with wall thickening of small bowel adjacent loops concerning for infectious ileitis and findings consistent with a possible ruptured diverticulitis and abscess. General Surgery was called for consultation. Lab work noted white blood cell count 21, anion gap of 16 with lactic acid level of 3.76. Patient was started on IV Cipro and Flagyl.    HPI/Subjective: 8/11 states increased abdominal pain, negative CP, negative SOB   Assessment/Plan: Diverticulitis of colon with perforation  -As per Gen Surg  - s/p L transgluteal percutaneous drainage in IR 8/7  - diet per Gen Surg, anticipate very slow advancement  - Abscess cultures positive enterococcus which is pan sensitive as well as pan sensitive E coli  - Ertapenem was changed to zosyn previously as pharmacy suggested ertapenem provides poor enterococcus coverage  - cont TNA   Sepsis (resolving) -WBCs trending down   -NSR  Metabolic acidosis  -resolved w/ volume resuscitation   Chronic afib w/ acute RVR  -RVR resolved  - Continue Cardizem drip  -Continue Lovenox  BID    Chronic diastolic CHF  -EF 75% on echogram in 2012 - currently euvolemic   HTN  -BP not within AHA guidelines however given patient's age and disease process would not attempt to lower further at this time   UTI  -Current abx should provide adequate coverage - appears that urine was not sent for cx    Hypothyroid  -Continue Synthroid 12.5 mcg daily    Hx of Fe deficient anemia  -Fe studies most c/w "anemia of chronic disease" i.e. likely a result of inflammatory state and recent poor nutrition   Macrocytosis  -B12 normal/high June 2014 - recheck notes adequate stores (658)   Mechanical fall with left open tibial fracture June 2015  Cont current prescribed wound care (followed by Dr. Migdalia Dk)   Hypokalemia -Potassium IV 10 mEq x5 runs -Repeat potassium at 1800  Hypomagnesemia -Repeat magnesium 1800     Code Status:DNR Family Communication: no family present at time of exam Disposition Plan: Per surgery   Consultants: Dr. Rolm Bookbinder (surgery)    Procedure/Significant Events: 8/7 - transgluteal percutaneous drainage in IR  8/7 - PICC line placement  8/8 - begin TNA    Culture Cx E coli sensitive to both atbx listed below   Antibiotics: Cipro 8/06 >> stopped 8/7 Flagyl 8/06 >> stopped 8/7 Ertapenem 8/07 >> stopped 8/9  Zosyn 8/9 >>   DVT prophylaxis: IV heparin > lovenox    Devices 8/7 The superior medial buttocks JP drain   LINES / TUBES:  8/7 double-lumen midline PICC 8/10 20 ga left forearm   Continuous Infusions: . sodium chloride 10 mL/hr at 08/29/13 0851  . diltiazem (CARDIZEM) infusion 5 mg/hr (08/30/13 1153)  . Marland KitchenTPN (CLINIMIX-E) Adult 60 mL/hr at 08/29/13 1714   And  . fat emulsion 240 mL (08/29/13 1714)  . Marland KitchenTPN (CLINIMIX-E) Adult     And  . fat emulsion      Objective: VITAL SIGNS:  Temp: 98 F (36.7 C) (08/11 1147) Temp src: Oral (08/11 1147) BP: 130/68 mmHg (08/11 1147) Pulse Rate: 107 (08/11 1147) SPO2; FIO2:   Intake/Output Summary (Last 24 hours) at 08/30/13 1248 Last data filed at 08/30/13 1100  Gross per 24 hour  Intake   2085 ml  Output   2922 ml  Net   -837 ml     Exam: General: A./O. x4, NAD, uncomfortable, No acute respiratory distress Lungs: Clear to auscultation bilaterally without wheezes or  crackles Cardiovascular: Regular rate and rhythm without murmur gallop or rub normal S1 and S2 Abdomen: Nontender, nondistended, hypoactive bowel sounds, no rebound, no ascites, no appreciable mass, left mild CVA tenderness appropriate for drain Extremities: No significant cyanosis, clubbing, or edema bilateral lower extremities  Data Reviewed: Basic Metabolic Panel:  Recent Labs Lab 08/26/13 0055 08/27/13 0445 08/28/13 0310 08/29/13 0325 08/30/13 0432  NA 136* 137 136* 138 136*  K 4.0 3.5* 4.3 3.5* 3.2*  CL 98 102 102 100 95*  CO2 24 25 21 24 29   GLUCOSE 121* 129* 127* 148* 159*  BUN 10 14 13 10 9   CREATININE 0.78 0.88 0.77 0.68 0.56  CALCIUM 7.9* 8.1* 8.4 8.3* 8.3*  MG  --   --  2.1 1.7 1.6  PHOS  --   --  2.8 2.6  --    Liver Function Tests:  Recent Labs Lab 08/25/13 1008 08/26/13 0055 08/27/13 0445 08/29/13 0325  AST 24 23 16 23   ALT 31 21 15 22   ALKPHOS 90 87 70 92  BILITOT 1.2 1.2 0.7 1.0  PROT 7.7 6.4 6.1 6.6  ALBUMIN 3.2* 2.5* 2.1* 2.3*    Recent Labs Lab 08/25/13 1008  LIPASE 13   No results found for this basename: AMMONIA,  in the last 168 hours CBC:  Recent Labs Lab 08/26/13 0055 08/27/13 0445 08/28/13 0310 08/29/13 0325 08/30/13 0432  WBC 24.9* 18.5* 13.7* 14.9* 12.7*  NEUTROABS  --   --   --  12.8*  --   HGB 11.1* 10.2* 10.5* 11.3* 12.9  HCT 33.4* 31.7* 32.8* 33.7* 38.6  MCV 106.0* 106.7* 106.5* 106.6* 102.9*  PLT 232 233 254 322 338   Cardiac Enzymes: No results found for this basename: CKTOTAL, CKMB, CKMBINDEX, TROPONINI,  in the last 168 hours BNP (last 3 results) No results found for this basename: PROBNP,  in the last 8760 hours CBG:  Recent Labs Lab 08/29/13 1152 08/29/13 1737 08/29/13 2342 08/30/13 0553 08/30/13 1150  GLUCAP 156* 114* 177* 168* 150*    Recent Results (from the past 240 hour(s))  ANAEROBIC CULTURE     Status: None   Collection Time    08/22/13  1:41 PM      Result Value Ref Range Status    Specimen Description LEG LEFT BONE CHIP   Final   Special Requests     Final   Value: PATIENT ON FOLLOWING CIPRO 400 MG PRIOR TO CASE STARTING   Gram Stain     Final   Value: NO WBC SEEN     NO SQUAMOUS EPITHELIAL CELLS SEEN     NO ORGANISMS SEEN     Performed at [facility]   Culture     Final   Value: NO ANAEROBES ISOLATED     Performed at [facility]   Report Status 08/27/2013 FINAL   Final  TISSUE CULTURE     Status: None   Collection Time    08/22/13  1:41 PM  Result Value Ref Range Status   Specimen Description LEG LEFT BONE CHIP   Final   Special Requests     Final   Value: PATIENT ON FOLLOWING CIPRO 400 MG PRIOR TO CASE STARTING   Gram Stain     Final   Value: NO WBC SEEN     NO SQUAMOUS EPITHELIAL CELLS SEEN     NO ORGANISMS SEEN     Performed at [facility]   Culture     Final   Value: FEW STAPHYLOCOCCUS AUREUS     Note: RIFAMPIN AND GENTAMICIN SHOULD NOT BE USED AS SINGLE DRUGS FOR TREATMENT OF STAPH INFECTIONS.     Performed at [facility]   Report Status 08/25/2013 FINAL   Final   Organism ID, Bacteria STAPHYLOCOCCUS AUREUS   Final  CULTURE, BLOOD (ROUTINE X 2)     Status: None   Collection Time    08/25/13  3:05 PM      Result Value Ref Range Status   Specimen Description BLOOD LEFT FOREARM   Final   Special Requests BOTTLES DRAWN AEROBIC ONLY 5CC   Final   Culture  Setup Time     Final   Value: 08/25/2013 18:56     Performed at [facility]   Culture     Final   Value:        BLOOD CULTURE RECEIVED NO GROWTH TO DATE CULTURE WILL BE HELD FOR 5 DAYS BEFORE ISSUING A FINAL NEGATIVE REPORT     Performed at [facility]   Report Status PENDING   Incomplete  CULTURE, BLOOD (ROUTINE X 2)     Status: None   Collection Time    08/25/13  3:45 PM      Result Value Ref Range Status   Specimen Description BLOOD LEFT FOREARM   Final   Special Requests BOTTLES DRAWN AEROBIC ONLY 5CC   Final   Culture  Setup Time      Final   Value: 08/25/2013 20:08     Performed at [facility]   Culture     Final   Value:        BLOOD CULTURE RECEIVED NO GROWTH TO DATE CULTURE WILL BE HELD FOR 5 DAYS BEFORE ISSUING A FINAL NEGATIVE REPORT     Performed at [facility]   Report Status PENDING   Incomplete  MRSA PCR SCREENING     Status: None   Collection Time    08/25/13  6:02 PM      Result Value Ref Range Status   MRSA by PCR NEGATIVE  NEGATIVE Final   Comment:            The GeneXpert MRSA Assay (FDA     approved for NASAL specimens     only), is one component of a     comprehensive MRSA colonization     surveillance program. It is not     intended to diagnose MRSA     infection nor to guide or     monitor treatment for     MRSA infections.  SURGICAL PCR SCREEN     Status: None   Collection Time    08/26/13 10:27 AM      Result Value Ref Range Status   MRSA, PCR NEGATIVE  NEGATIVE Final   Staphylococcus aureus NEGATIVE  NEGATIVE Final   Comment:            The Xpert SA Assay (FDA  approved for NASAL specimens     in patients over 43 years of age),     is one component of     a comprehensive surveillance     program.  Test performance has     been validated by [employer] for patients greater     than or equal to 85 year old.     It is not intended     to diagnose infection nor to     guide or monitor treatment.  CULTURE, ROUTINE-ABSCESS     Status: None   Collection Time    08/26/13  2:04 PM      Result Value Ref Range Status   Specimen Description BUTTOCKS LEFT   Final   Special Requests LEFT UPPER BUTTOCK JP DRAIN   Final   Gram Stain     Final   Value: ABUNDANT WBC PRESENT,BOTH PMN AND MONONUCLEAR     NO SQUAMOUS EPITHELIAL CELLS SEEN     ABUNDANT GRAM POSITIVE RODS     ABUNDANT GRAM NEGATIVE RODS     FEW GRAM POSITIVE COCCI   Culture     Final   Value: MODERATE ESCHERICHIA COLI     MODERATE ENTEROCOCCUS SPECIES     Performed at [facility]   Report  Status 08/29/2013 FINAL   Final   Organism ID, Bacteria ESCHERICHIA COLI   Final   Organism ID, Bacteria ENTEROCOCCUS SPECIES   Final  ANAEROBIC CULTURE     Status: None   Collection Time    08/26/13  2:04 PM      Result Value Ref Range Status   Specimen Description ABSCESS BUTTOCKS LEFT   Final   Special Requests LEFT UPPER BUTTOCK JP DRAIN   Final   Gram Stain     Final   Value: ABUNDANT WBC PRESENT,BOTH PMN AND MONONUCLEAR     NO SQUAMOUS EPITHELIAL CELLS SEEN     ABUNDANT GRAM POSITIVE RODS     ABUNDANT GRAM NEGATIVE RODS     FEW GRAM POSITIVE COCCI   Culture     Final   Value: NO ANAEROBES ISOLATED; CULTURE IN PROGRESS FOR 5 DAYS     Performed at [facility]   Report Status PENDING   Incomplete  CLOSTRIDIUM DIFFICILE BY PCR     Status: None   Collection Time    08/30/13  6:09 AM      Result Value Ref Range Status   C difficile by pcr NEGATIVE  NEGATIVE Final     Studies:  Recent x-ray studies have been reviewed in detail by the Attending Physician  Scheduled Meds:  Scheduled Meds: . enoxaparin (LOVENOX) injection  85 mg Subcutaneous Q12H  . insulin aspart  0-9 Units Subcutaneous 4 times per day  . levothyroxine  12.5 mcg Intravenous Daily  . metoprolol  5 mg Intravenous 4 times per day  . piperacillin-tazobactam (ZOSYN)  IV  3.375 g Intravenous 3 times per day  . potassium chloride  10 mEq Intravenous Q1 Hr x 5  . temazepam  30 mg Oral QHS    Time spent on care of this patient: 40 mins   Allie Bossier , MD   [facility] Office  [phone] Pager - [phone]  On-Call/Text Page:      [email or web address]      password [email or web address]  If 7PM-7AM, please contact night-coverage [email or web address] Password [email or web address] 08/30/2013, 12:48 PM   LOS: 5 days

## 2013-08-30 NOTE — Progress Notes (Signed)
Patient ID: [name], female   DOB: 08-05-1925, 80 y.o.   MRN: [id number]     [facility]      [address] [place], [place] [address]    Phone: [phone] FAX: [phone]     Subjective: NGT placed yesterday due to 2 episodes of emesis, 1062ml out.  Now having diarrhea.  WBC down.  Afebrile.  States she feels weak, but pain is better.  Objective:  Vital signs:  Filed Vitals:   08/29/13 2000 08/29/13 2012 08/29/13 2344 08/30/13 0400  BP: 165/91 165/91 151/107 137/79  Pulse: 96  96 93  Temp:  98.2 F (36.8 C) 98.7 F (37.1 C) 98.9 F (37.2 C)  TempSrc:  Oral Axillary Oral  Resp:  20 22   Height:      Weight:      SpO2:  97% 97% 97%    Last BM Date: 08/29/13  Intake/Output   Yesterday:  08/10 0701 - 08/11 0700 In: 2071.5 [P.O.:120; I.V.:226.5; NG/GT:90; IV Piggyback:125; TPN:1500] Out: 2547 [Urine:1475; Emesis/NG output:1050; Drains:22] This shift:    I/O last 3 completed shifts: In: 3036.5 [P.O.:360; I.V.:336.5; Other:25; NG/GT:90; IV Piggyback:175] Out: 3047 [EUMPN:3614; Emesis/NG output:1050; Drains:22]    Physical Exam:  General: Pt awake/alert/oriented x4 in no acute distress  Chest: cta. No chest wall pain w good excursion  CV: S1S2 regularly irregular  Abdomen: Soft. Nondistended. Minimal TTP to RLQ with deep palpation. No evidence of peritonitis. No incarcerated hernias. Left gluteal drain with serous output(39ml recorded yesterday).  Ext: RLE dressing, boot. No mjr edema. No cyanosis   Problem List:   Principal Problem:   Sepsis Active Problems:   HYPOTHYROIDISM   Iron deficiency anemia, unspecified   HYPERTENSION   Atrial flutter   Unspecified constipation   Metabolic acidosis   Diverticulitis of colon with perforation    Results:   Labs: Results for orders placed during the hospital encounter of 08/25/13 (from the past 48 hour(s))  GLUCOSE, CAPILLARY     Status: Abnormal   Collection Time    08/28/13  8:04 PM      Result Value Ref Range   Glucose-Capillary 163 (*) 70 - 99 mg/dL  GLUCOSE, CAPILLARY     Status: Abnormal   Collection Time    08/28/13 11:37 PM      Result Value Ref Range   Glucose-Capillary 132 (*) 70 - 99 mg/dL  PROTIME-INR     Status: Abnormal   Collection Time    08/29/13  3:25 AM      Result Value Ref Range   Prothrombin Time 17.8 (*) 11.6 - 15.2 seconds   INR 1.47  0.00 - 1.49  CBC     Status: Abnormal   Collection Time    08/29/13  3:25 AM      Result Value Ref Range   WBC 14.9 (*) 4.0 - 10.5 K/uL   RBC 3.16 (*) 3.87 - 5.11 MIL/uL   Hemoglobin 11.3 (*) 12.0 - 15.0 g/dL   HCT 33.7 (*) 36.0 - 46.0 %   MCV 106.6 (*) 78.0 - 100.0 fL   MCH 35.8 (*) 26.0 - 34.0 pg   MCHC 33.5  30.0 - 36.0 g/dL   RDW 12.9  11.5 - 15.5 %   Platelets 322  150 - 400 K/uL  COMPREHENSIVE METABOLIC PANEL     Status: Abnormal   Collection Time    08/29/13  3:25 AM      Result  Value Ref Range   Sodium 138  137 - 147 mEq/L   Potassium 3.5 (*) 3.7 - 5.3 mEq/L   Comment: DELTA CHECK NOTED   Chloride 100  96 - 112 mEq/L   CO2 24  19 - 32 mEq/L   Glucose, Bld 148 (*) 70 - 99 mg/dL   BUN 10  6 - 23 mg/dL   Creatinine, Ser 0.68  0.50 - 1.10 mg/dL   Calcium 8.3 (*) 8.4 - 10.5 mg/dL   Total Protein 6.6  6.0 - 8.3 g/dL   Albumin 2.3 (*) 3.5 - 5.2 g/dL   AST 23  0 - 37 U/L   ALT 22  0 - 35 U/L   Alkaline Phosphatase 92  39 - 117 U/L   Total Bilirubin 1.0  0.3 - 1.2 mg/dL   GFR calc non Af Amer 78 (*) >90 mL/min   GFR calc Af Amer 90 (*) >90 mL/min   Comment: (NOTE)     The eGFR has been calculated using the CKD EPI equation.     This calculation has not been validated in all clinical situations.     eGFR's persistently <90 mL/min signify possible Chronic Kidney     Disease.   Anion gap 14  5 - 15  MAGNESIUM     Status: None   Collection Time    08/29/13  3:25 AM      Result Value Ref Range   Magnesium 1.7  1.5 - 2.5 mg/dL  PHOSPHORUS     Status:  None   Collection Time    08/29/13  3:25 AM      Result Value Ref Range   Phosphorus 2.6  2.3 - 4.6 mg/dL  DIFFERENTIAL     Status: Abnormal   Collection Time    08/29/13  3:25 AM      Result Value Ref Range   Neutrophils Relative % 85 (*) 43 - 77 %   Neutro Abs 12.8 (*) 1.7 - 7.7 K/uL   Lymphocytes Relative 6 (*) 12 - 46 %   Lymphs Abs 0.9  0.7 - 4.0 K/uL   Monocytes Relative 8  3 - 12 %   Monocytes Absolute 1.1 (*) 0.1 - 1.0 K/uL   Eosinophils Relative 1  0 - 5 %   Eosinophils Absolute 0.2  0.0 - 0.7 K/uL   Basophils Relative 0  0 - 1 %   Basophils Absolute 0.0  0.0 - 0.1 K/uL  PREALBUMIN     Status: Abnormal   Collection Time    08/29/13  3:25 AM      Result Value Ref Range   Prealbumin 8.0 (*) 17.0 - 34.0 mg/dL   Comment: Performed at [facility]     Status: None   Collection Time    08/29/13  3:25 AM      Result Value Ref Range   Triglycerides 73  <150 mg/dL  GLUCOSE, CAPILLARY     Status: Abnormal   Collection Time    08/29/13  5:58 AM      Result Value Ref Range   Glucose-Capillary 177 (*) 70 - 99 mg/dL  GLUCOSE, CAPILLARY     Status: Abnormal   Collection Time    08/29/13 11:52 AM      Result Value Ref Range   Glucose-Capillary 156 (*) 70 - 99 mg/dL  GLUCOSE, CAPILLARY     Status: Abnormal   Collection Time    08/29/13  5:37 PM  Result Value Ref Range   Glucose-Capillary 114 (*) 70 - 99 mg/dL  GLUCOSE, CAPILLARY     Status: Abnormal   Collection Time    08/29/13 11:42 PM      Result Value Ref Range   Glucose-Capillary 177 (*) 70 - 99 mg/dL  PROTIME-INR     Status: Abnormal   Collection Time    08/30/13  4:32 AM      Result Value Ref Range   Prothrombin Time 15.8 (*) 11.6 - 15.2 seconds   INR 1.26  0.00 - 1.49  CBC     Status: Abnormal   Collection Time    08/30/13  4:32 AM      Result Value Ref Range   WBC 12.7 (*) 4.0 - 10.5 K/uL   RBC 3.75 (*) 3.87 - 5.11 MIL/uL   Hemoglobin 12.9  12.0 - 15.0 g/dL   HCT 38.6  36.0 -  46.0 %   MCV 102.9 (*) 78.0 - 100.0 fL   MCH 34.4 (*) 26.0 - 34.0 pg   MCHC 33.4  30.0 - 36.0 g/dL   RDW 12.5  11.5 - 15.5 %   Platelets 338  150 - 400 K/uL  BASIC METABOLIC PANEL     Status: Abnormal   Collection Time    08/30/13  4:32 AM      Result Value Ref Range   Sodium 136 (*) 137 - 147 mEq/L   Potassium 3.2 (*) 3.7 - 5.3 mEq/L   Chloride 95 (*) 96 - 112 mEq/L   CO2 29  19 - 32 mEq/L   Glucose, Bld 159 (*) 70 - 99 mg/dL   BUN 9  6 - 23 mg/dL   Creatinine, Ser 0.56  0.50 - 1.10 mg/dL   Calcium 8.3 (*) 8.4 - 10.5 mg/dL   GFR calc non Af Amer 83 (*) >90 mL/min   GFR calc Af Amer >90  >90 mL/min   Comment: (NOTE)     The eGFR has been calculated using the CKD EPI equation.     This calculation has not been validated in all clinical situations.     eGFR's persistently <90 mL/min signify possible Chronic Kidney     Disease.   Anion gap 12  5 - 15  MAGNESIUM     Status: None   Collection Time    08/30/13  4:32 AM      Result Value Ref Range   Magnesium 1.6  1.5 - 2.5 mg/dL  GLUCOSE, CAPILLARY     Status: Abnormal   Collection Time    08/30/13  5:53 AM      Result Value Ref Range   Glucose-Capillary 168 (*) 70 - 99 mg/dL    Imaging / Studies: Dg Abd Portable 1v  08/29/2013   CLINICAL DATA:  Confirm NG tube placement.  EXAM: PORTABLE ABDOMEN - 1 VIEW  COMPARISON:  CT 08/25/2013  FINDINGS: Nasogastric tube courses through the stomach with tip and side-port right of midline in the right upper quadrant likely over the distal stomach or proximal duodenum. A pigtail drainage catheter seen with tip coiled over the left pelvis over the expected region of patient's diverticular abscess. Mild contrast is present within the colon. There are multiple air-filled mildly dilated small bowel loops more prominent than on the recent CT scout film. Remainder of the exam is unchanged.  IMPRESSION: Multiple air-filled mildly dilated small bowel loops worse compared to the recent CT scout film.  Findings may be due to obstruction  related to the thick-walled small bowel loop in the right lower quadrant on the recent CT scan versus secondary ileus to patient's presumed diverticulitis.  Nasogastric tube with tip and side-port right of midline in the right upper quadrant likely over the distal stomach or proximal duodenum. Drainage catheter over the left pelvis.   Electronically Signed   By: Marin Olp M.D.   On: 08/29/2013 15:01    Medications / Allergies:  Scheduled Meds: . enoxaparin (LOVENOX) injection  85 mg Subcutaneous Q12H  . insulin aspart  0-9 Units Subcutaneous 4 times per day  . levothyroxine  12.5 mcg Intravenous Daily  . metoprolol  5 mg Intravenous 4 times per day  . piperacillin-tazobactam (ZOSYN)  IV  3.375 g Intravenous 3 times per day  . temazepam  30 mg Oral QHS   Continuous Infusions: . sodium chloride 10 mL/hr at 08/29/13 0851  . diltiazem (CARDIZEM) infusion 5 mg/hr (08/29/13 1748)  . Marland KitchenTPN (CLINIMIX-E) Adult 60 mL/hr at 08/29/13 1714   And  . fat emulsion 240 mL (08/29/13 1714)   PRN Meds:.acetaminophen, acetaminophen, LORazepam, morphine injection, ondansetron (ZOFRAN) IV, ondansetron, sodium chloride  Antibiotics: Anti-infectives   Start     Dose/Rate Route Frequency Ordered Stop   08/28/13 1400  piperacillin-tazobactam (ZOSYN) IVPB 3.375 g     3.375 g 12.5 mL/hr over 240 Minutes Intravenous 3 times per day 08/28/13 1126     08/26/13 0800  ertapenem (INVANZ) 1 g in sodium chloride 0.9 % 50 mL IVPB  Status:  Discontinued     1 g 100 mL/hr over 30 Minutes Intravenous Every 24 hours 08/26/13 0747 08/28/13 1126   08/26/13 0200  ciprofloxacin (CIPRO) IVPB 400 mg  Status:  Discontinued     400 mg 200 mL/hr over 60 Minutes Intravenous Every 12 hours 08/25/13 1646 08/26/13 0747   08/25/13 1645  metroNIDAZOLE (FLAGYL) IVPB 500 mg  Status:  Discontinued     500 mg 100 mL/hr over 60 Minutes Intravenous Every 8 hours 08/25/13 1636 08/26/13 0747   08/25/13 1345   ciprofloxacin (CIPRO) IVPB 400 mg     400 mg 200 mL/hr over 60 Minutes Intravenous  Once 08/25/13 1344 08/25/13 1515   08/25/13 1345  metroNIDAZOLE (FLAGYL) IVPB 500 mg     500 mg 100 mL/hr over 60 Minutes Intravenous  Once 08/25/13 1344 08/25/13 1554      Assessment/Plan  (Hospital problems)  Atrial flutter-heparin/cardizem  HTN  Hypothyroidism  IDA  Chronic diastolic heart failure   (Principal problem)  sepsis  Abdominal pain, RLQ inflammatory process  Diverticulitis with abscess  -s/p IR drain placement 8/7  -she is slowly improving, we will progress her very slowly. She has a resolved ileus, now having diarrhea, c diff pending.  will clamp her NGT x6 hours and if able to tolerate DC and start sips of clears today -repeat CT in AM -Cx E coli sensitive to both atbx listed below -Invanz 8/7-8/9,changed to Zosyn 8/9  -TPN -mobilize  -continue with conservative management, hopefully we can avoid surgery.    Erby Pian, [facility] Pager [phone] Office [phone]  08/30/2013 8:01 AM

## 2013-08-30 NOTE — Progress Notes (Signed)
PARENTERAL NUTRITION CONSULT NOTE - FOLLOW UP  Pharmacy Consult for TPN Indication: Diverticulitis s/p drain, inability to advance diet  Allergies  Allergen Reactions  . Atenolol Nausea And Vomiting  . Clarithromycin Nausea And Vomiting  . Codeine Sulfate Nausea Only  . Macrodantin Other (See Comments)    Long time ago - reaction unknown  . Percocet [Oxycodone-Acetaminophen] Nausea And Vomiting    Patient Measurements: Height: 5\' 3"  (160 cm) Weight: 198 lb 13.7 oz (90.2 kg) IBW/kg (Calculated) : 52.4 Adjusted Body Weight: 62.2 kg  Vital Signs: Temp: 98.3 F (36.8 C) (08/11 0846) Temp src: Oral (08/11 0846) BP: 159/72 mmHg (08/11 0846) Pulse Rate: 97 (08/11 0846) Intake/Output from previous day: 08/10 0701 - 08/11 0700 In: 2071.5 [P.O.:120; I.V.:226.5; NG/GT:90; IV Piggyback:125; TPN:1500] Out: 2547 [Urine:1475; Emesis/NG output:1050; Drains:22] Intake/Output from this shift: Total I/O In: 195 [I.V.:30; IV Piggyback:25; TPN:140] Out: 400 [Urine:400]  Labs:  Recent Labs  08/28/13 0310 08/29/13 0325 08/30/13 0432  WBC 13.7* 14.9* 12.7*  HGB 10.5* 11.3* 12.9  HCT 32.8* 33.7* 38.6  PLT 254 322 338  INR 1.49 1.47 1.26     Recent Labs  08/28/13 0310 08/29/13 0325 08/30/13 0432  NA 136* 138 136*  K 4.3 3.5* 3.2*  CL 102 100 95*  CO2 21 24 29   GLUCOSE 127* 148* 159*  BUN 13 10 9   CREATININE 0.77 0.68 0.56  CALCIUM 8.4 8.3* 8.3*  MG 2.1 1.7 1.6  PHOS 2.8 2.6  --   PROT  --  6.6  --   ALBUMIN  --  2.3*  --   AST  --  23  --   ALT  --  22  --   ALKPHOS  --  92  --   BILITOT  --  1.0  --   PREALBUMIN  --  8.0*  --   TRIG  --  73  --    Estimated Creatinine Clearance: 54.8 ml/min (by C-G formula based on Cr of 0.56).    Recent Labs  08/29/13 1737 08/29/13 2342 08/30/13 0553  GLUCAP 114* 177* 168*     Insulin Requirements in the past 24 hours:  6 units SSI (CBGs 114-177)  Current Nutrition:  NPO  Nutritional Goals:  1750-1950 kCal,  110-125 grams of protein per day Goal TPN at 100ml/hr + lipids= 1894 kcal and 99.6g protein daily.  Admit: Diverticulitis with abscess s/p drainage per IR  GI: Diverticulitis with abscess, drain placed 8/7. N/V 8/10. Triglycerides 73. Prealbumin 8. (Baseline albumin 3.2 on 08/24/13)  Endo: Synthoid IV, SSI coverage for CBGs 114-177. Continue to assess trends before adding insulin to TPN.  Lytes: K 3.2 down, Mag 1.6 down, Phos 2.6. (Goal K>4 and Mg>2 with afib). Possibly refeeding.   Renal: SCr 0.56.  IV fluids NS at kvo.  Pulm: 97% on 2L [place]  Cards: Afib, HTN, dHF - Coumadin on hold and currently anticoagulated with Lovenox 85mg /12h. Plts up to 338. VSS on diltiazem IV, Toprol XL  Hepatobil: LFTs within normal limits  Neuro/MSK: left open tibial fracture June 2015 with wound vac. Restoril.  ID:  ABX D#[id number] for diverticulitis + abscess with Enterococcus/Ecoli in culture from JP drain. Changed to Zosyn. BC from 8/6 remain NGTD.  Best Practices: Lovenox  TPN Access: PICC line (8/7)  TPN day#: 3 (start date 8/9)  Plan:  1. Increase Clinimix E 5/15 to 45ml/hr + 20% lipid emulsion at 10 ml/hr.(Goal) 2. Magnesium 2g IV x 1 3. Kruns x 5 today.  4. Recheck labs in AM.   Julliette Frentz S. Alford Highland, PharmD, [facility] Clinical Staff Pharmacist Pager [phone]  08/30/2013, 9:28 AM

## 2013-08-30 NOTE — Progress Notes (Signed)
c diff negative, wbc better, her exam continues to improve, hopefully ng out later, continue abx, will f/u with ct tomorrow

## 2013-08-31 ENCOUNTER — Inpatient Hospital Stay (HOSPITAL_COMMUNITY): Payer: Medicare Other

## 2013-08-31 ENCOUNTER — Encounter (HOSPITAL_COMMUNITY): Payer: Self-pay

## 2013-08-31 DIAGNOSIS — A419 Sepsis, unspecified organism: Secondary | ICD-10-CM

## 2013-08-31 LAB — CBC WITH DIFFERENTIAL/PLATELET
Basophils Absolute: 0 10*3/uL (ref 0.0–0.1)
Basophils Relative: 0 % (ref 0–1)
Eosinophils Absolute: 0.2 10*3/uL (ref 0.0–0.7)
Eosinophils Relative: 2 % (ref 0–5)
HCT: 33.8 % — ABNORMAL LOW (ref 36.0–46.0)
Hemoglobin: 11.4 g/dL — ABNORMAL LOW (ref 12.0–15.0)
Lymphocytes Relative: 11 % — ABNORMAL LOW (ref 12–46)
Lymphs Abs: 1.3 10*3/uL (ref 0.7–4.0)
MCH: 36.2 pg — ABNORMAL HIGH (ref 26.0–34.0)
MCHC: 33.7 g/dL (ref 30.0–36.0)
MCV: 107.3 fL — ABNORMAL HIGH (ref 78.0–100.0)
Monocytes Absolute: 1.1 10*3/uL — ABNORMAL HIGH (ref 0.1–1.0)
Monocytes Relative: 9 % (ref 3–12)
Neutro Abs: 9.4 10*3/uL — ABNORMAL HIGH (ref 1.7–7.7)
Neutrophils Relative %: 78 % — ABNORMAL HIGH (ref 43–77)
Platelets: 327 10*3/uL (ref 150–400)
RBC: 3.15 MIL/uL — ABNORMAL LOW (ref 3.87–5.11)
RDW: 12.7 % (ref 11.5–15.5)
WBC: 12 10*3/uL — ABNORMAL HIGH (ref 4.0–10.5)

## 2013-08-31 LAB — COMPREHENSIVE METABOLIC PANEL
ALT: 21 U/L (ref 0–35)
AST: 21 U/L (ref 0–37)
Albumin: 2.1 g/dL — ABNORMAL LOW (ref 3.5–5.2)
Alkaline Phosphatase: 84 U/L (ref 39–117)
Anion gap: 13 (ref 5–15)
BUN: 12 mg/dL (ref 6–23)
CO2: 27 mEq/L (ref 19–32)
Calcium: 8.1 mg/dL — ABNORMAL LOW (ref 8.4–10.5)
Chloride: 96 mEq/L (ref 96–112)
Creatinine, Ser: 0.66 mg/dL (ref 0.50–1.10)
GFR calc Af Amer: >90 mL/min (ref >90)
GFR calc non Af Amer: 78 mL/min — ABNORMAL LOW (ref >90)
Glucose, Bld: 118 mg/dL — ABNORMAL HIGH (ref 70–99)
Potassium: 3.4 mEq/L — ABNORMAL LOW (ref 3.7–5.3)
Sodium: 136 mEq/L — ABNORMAL LOW (ref 137–147)
Total Bilirubin: 0.9 mg/dL (ref 0.3–1.2)
Total Protein: 6 g/dL (ref 6.0–8.3)

## 2013-08-31 LAB — GLUCOSE, CAPILLARY
Glucose-Capillary: 151 mg/dL — ABNORMAL HIGH (ref 70–99)
Glucose-Capillary: 160 mg/dL — ABNORMAL HIGH (ref 70–99)
Glucose-Capillary: 164 mg/dL — ABNORMAL HIGH (ref 70–99)
Glucose-Capillary: 171 mg/dL — ABNORMAL HIGH (ref 70–99)

## 2013-08-31 LAB — CULTURE, BLOOD (ROUTINE X 2)
Culture: NO GROWTH
Culture: NO GROWTH

## 2013-08-31 LAB — PROTIME-INR
INR: 1.19 (ref 0.00–1.49)
Prothrombin Time: 15.1 seconds (ref 11.6–15.2)

## 2013-08-31 LAB — MAGNESIUM: Magnesium: 1.9 mg/dL (ref 1.5–2.5)

## 2013-08-31 MED ORDER — BUSPIRONE HCL 15 MG PO TABS
7.5000 mg | ORAL_TABLET | Freq: Two times a day (BID) | ORAL | Status: DC
  Administered 2013-09-01 – 2013-09-08 (×15): 7.5 mg via ORAL
  Filled 2013-08-31 (×18): qty 1

## 2013-08-31 MED ORDER — TRACE MINERALS CR-CU-F-FE-I-MN-MO-SE-ZN IV SOLN
INTRAVENOUS | Status: AC
  Administered 2013-08-31: 18:00:00 via INTRAVENOUS
  Filled 2013-08-31: qty 2000

## 2013-08-31 MED ORDER — ENOXAPARIN SODIUM 100 MG/ML ~~LOC~~ SOLN
90.0000 mg | Freq: Two times a day (BID) | SUBCUTANEOUS | Status: DC
  Administered 2013-08-31 – 2013-09-01 (×3): 90 mg via SUBCUTANEOUS
  Filled 2013-08-31 (×4): qty 1

## 2013-08-31 MED ORDER — GELATIN ABSORBABLE 12-7 MM EX MISC
CUTANEOUS | Status: AC
Start: 2013-08-31 — End: 2013-09-01
  Filled 2013-08-31: qty 1

## 2013-08-31 MED ORDER — PROMETHAZINE HCL 25 MG/ML IJ SOLN
12.5000 mg | Freq: Four times a day (QID) | INTRAMUSCULAR | Status: DC | PRN
  Administered 2013-08-31: 12.5 mg via INTRAVENOUS
  Filled 2013-08-31: qty 1

## 2013-08-31 MED ORDER — IOHEXOL 300 MG/ML  SOLN
25.0000 mL | INTRAMUSCULAR | Status: AC
  Administered 2013-08-31 (×2): 25 mL via ORAL

## 2013-08-31 MED ORDER — IOHEXOL 300 MG/ML  SOLN
100.0000 mL | Freq: Once | INTRAMUSCULAR | Status: AC | PRN
  Administered 2013-08-31: 100 mL via INTRAVENOUS

## 2013-08-31 MED ORDER — FAT EMULSION 20 % IV EMUL
240.0000 mL | INTRAVENOUS | Status: AC
  Administered 2013-08-31: 240 mL via INTRAVENOUS
  Filled 2013-08-31: qty 250

## 2013-08-31 MED ORDER — POTASSIUM CHLORIDE 10 MEQ/50ML IV SOLN
10.0000 meq | INTRAVENOUS | Status: AC
  Administered 2013-08-31 (×4): 10 meq via INTRAVENOUS
  Filled 2013-08-31 (×4): qty 50

## 2013-08-31 NOTE — Progress Notes (Signed)
NUTRITION FOLLOW UP  Intervention:   TPN per pharmacy. Diet advancement as tolerated per MD  Nutrition Dx:   Inadequate oral intake related to inability to eat as evidenced by NPO; ongoing   Goal:   Pt to meet >/= 90% of their estimated nutrition needs; met  Monitor:   Weight trends, NPO, TPN, labs, I/O's  Assessment:   80 y.o. female with past medical history of atrial fibrillation on chronic anticoagulation and recent fall last month ago which led to an open fracture of the lower extremity requiring surgical intervention post wound VAC. Patient was in skilled nursing for rehabilitation and started having left lower quadrant abdominal pain the last few days to the point where she could not take it anymore and came into the emergency room.   8/07: - CT scan of the abdomen concerning for infectious ileitis, possible ruptured diverticulitis and peridiverticular abscess.  - Surgery eval recommended IR placement of perc drain.  - Pt reports no recent weight loss and says that she was eating well prior to admission. She drinks one Ensure nutritional supplement per day at home.   8/12: - TPN started 8/8 - Pt also tolerating sips of clear liquid diet and having small bowel movements. No N/V.   Current TPN is at goal rate: Clinimix E 5/15 at 38ml/hr + 20% lipid emulsion at 10 ml/hr Providing 1894 kcal (100% of estimated needs) and 99.6g protein (91% of estimated needs) daily  Per Pharmacy: K 3.4 down, Mag 1.9, Phos 2.6. (Goal K>4 and Mg>2 with afib). Possibly refeeding.   Height: Ht Readings from Last 1 Encounters:  08/25/13 $RemoveB'5\' 3"'cbIgbmfH$  (1.6 m)    Weight Status:   Wt Readings from Last 1 Encounters:  08/29/13 198 lb 13.7 oz (90.2 kg)    Re-estimated needs:  Kcal: 1750-1950 Protein: 110-125 g Fluid: Per MD  Skin: wound vac on lower left leg  Diet Order: Clear Liquid   Intake/Output Summary (Last 24 hours) at 08/31/13 1208 Last data filed at 08/31/13 0800  Gross per 24 hour   Intake   2472 ml  Output    175 ml  Net   2297 ml    Last BM: 8/12   Labs:   Recent Labs Lab 08/28/13 0310 08/29/13 0325 08/30/13 0432 08/30/13 1830 08/31/13 0350  NA 136* 138 136*  --  136*  K 4.3 3.5* 3.2* 3.9 3.4*  CL 102 100 95*  --  96  CO2 $Re'21 24 29  'UAK$ --  27  BUN $Re'13 10 9  'UbS$ --  12  CREATININE 0.77 0.68 0.56  --  0.66  CALCIUM 8.4 8.3* 8.3*  --  8.1*  MG 2.1 1.7 1.6 2.0 1.9  PHOS 2.8 2.6  --   --   --   GLUCOSE 127* 148* 159*  --  118*    CBG (last 3)   Recent Labs  08/30/13 1559 08/30/13 2344 08/31/13 0615  GLUCAP 87 171* 164*    Scheduled Meds: . enoxaparin (LOVENOX) injection  90 mg Subcutaneous Q12H  . insulin aspart  0-9 Units Subcutaneous 4 times per day  . levothyroxine  12.5 mcg Intravenous Daily  . metoprolol  5 mg Intravenous 4 times per day  . piperacillin-tazobactam (ZOSYN)  IV  3.375 g Intravenous 3 times per day  . potassium chloride  10 mEq Intravenous Q1 Hr x 4  . temazepam  30 mg Oral QHS    Continuous Infusions: . sodium chloride 10 mL/hr at 08/31/13 0800  .  diltiazem (CARDIZEM) infusion 5 mg/hr (08/31/13 0800)  . Marland KitchenTPN (CLINIMIX-E) Adult 83 mL/hr at 08/31/13 0800   And  . fat emulsion 240 mL (08/31/13 0800)  . Marland KitchenTPN (CLINIMIX-E) Adult     And  . fat emulsion      Terrace Arabia RD, LDN

## 2013-08-31 NOTE — Progress Notes (Signed)
PARENTERAL NUTRITION CONSULT NOTE - FOLLOW UP  Pharmacy Consult for TPN Indication: Diverticulitis s/p drain, inability to advance diet  Allergies  Allergen Reactions  . Atenolol Nausea And Vomiting  . Clarithromycin Nausea And Vomiting  . Codeine Sulfate Nausea Only  . Macrodantin Other (See Comments)    Long time ago - reaction unknown  . Percocet [Oxycodone-Acetaminophen] Nausea And Vomiting    Patient Measurements: Height: 5\' 3"  (160 cm) Weight: 198 lb 13.7 oz (90.2 kg) IBW/kg (Calculated) : 52.4 Adjusted Body Weight: 62.2 kg  Vital Signs: Temp: 98.1 F (36.7 C) (08/12 0812) Temp src: Oral (08/12 0812) BP: 142/63 mmHg (08/12 0812) Intake/Output from previous day: 08/11 0701 - 08/12 0700 In: 2964 [P.O.:200; I.V.:360; IV Piggyback:425; EFE:0712] Out: 975 [Urine:975] Intake/Output from this shift: Total I/O In: 108 [I.V.:15; TPN:93] Out: -   Labs:  Recent Labs  08/29/13 0325 08/30/13 0432 08/31/13 0350  WBC 14.9* 12.7* 12.0*  HGB 11.3* 12.9 11.4*  HCT 33.7* 38.6 33.8*  PLT 322 338 327  INR 1.47 1.26 1.19     Recent Labs  08/29/13 0325 08/30/13 0432 08/30/13 1830 08/31/13 0350  NA 138 136*  --  136*  K 3.5* 3.2* 3.9 3.4*  CL 100 95*  --  96  CO2 24 29  --  27  GLUCOSE 148* 159*  --  118*  BUN 10 9  --  12  CREATININE 0.68 0.56  --  0.66  CALCIUM 8.3* 8.3*  --  8.1*  MG 1.7 1.6 2.0 1.9  PHOS 2.6  --   --   --   PROT 6.6  --   --  6.0  ALBUMIN 2.3*  --   --  2.1*  AST 23  --   --  21  ALT 22  --   --  21  ALKPHOS 92  --   --  84  BILITOT 1.0  --   --  0.9  PREALBUMIN 8.0*  --   --   --   TRIG 73  --   --   --    Estimated Creatinine Clearance: 54.8 ml/min (by C-G formula based on Cr of 0.66).    Recent Labs  08/30/13 1559 08/30/13 2344 08/31/13 0615  GLUCAP 87 171* 164*     Insulin Requirements in the past 24 hours:  7 units SSI (CBGs 87-171)  Current Nutrition:  NPO  Nutritional Goals:  1750-1950 kCal, 110-125 grams of  protein per day Goal TPN at 80ml/hr + lipids= 1894 kcal and 99.6g protein daily.  Admit: Diverticulitis with abscess s/p drainage per IR  GI: Diverticulitis with abscess, drain placed 8/7. N/V 8/10. Triglycerides 73. Prealbumin 8. (Baseline albumin 3.2 on 08/24/13). +BM. Tolerated sips. Advance to CL today.  Endo: Synthoid IV, SSI coverage for CBGs 87-171.  Lytes: K 3.4 down, Mag 1.9, Phos 2.6. (Goal K>4 and Mg>2 with afib). Possibly refeeding.   Renal: SCr 0.66. IV fluids NS at kvo.  Pulm: 98% on 2L [place]  Cards: Afib, HTN, dHF - Coumadin on hold and currently anticoagulated with Lovenox 85mg /12h.CBC relatively stable. VSS on diltiazem IV, Toprol XL  Ortho: recent fall 6/15 which led to an open fracture of the lower extremity requiring surgical intervention and wound care with wound VAC.   Hepatobil: LFTs within normal limits  Neuro/MSK: Restoril.  ID:  ABX D#7 for diverticulitis + abscess with Enterococcus/Ecoli in culture from JP drain on Zosyn. BC from 8/6 remain NGTD.  Best Practices: Lovenox  TPN Access: PICC line (8/7)  TPN day#: 4 (start date 8/9)  Plan:  1. Clinimix E 5/15 at 54ml/hr + 20% lipid emulsion at 10 ml/hr.(Goal) 2. Kruns x 4 today. 3. Clear liquids today   Kelsey Durflinger S. Alford Highland, PharmD, [facility] Clinical Staff Pharmacist Pager [phone]  08/31/2013, 8:25 AM

## 2013-08-31 NOTE — Progress Notes (Signed)
Agree with above 

## 2013-08-31 NOTE — Progress Notes (Signed)
Patient ID: [name], female   DOB: 01/10/1926, 80 y.o.   MRN: [id number]     [facility]      [address] [place], [place] [address]    Phone: [phone] FAX: [phone]     Subjective: Afebrile.  VSS.  WBC slightly down today.  Diarrhea has resolved, small loose BMs.  No n/v.  Tolerated sips yesterday.   Objective:  Vital signs:  Filed Vitals:   08/30/13 2018 08/30/13 2340 08/31/13 0000 08/31/13 0441  BP: 125/77  103/58 129/82  Pulse:      Temp: 98.3 F (36.8 C) 98 F (36.7 C)  97.3 F (36.3 C)  TempSrc: Oral Oral  Oral  Resp: _0 Height:      Weight:      SpO2: 98% 98% 97% 100%    Last BM Date: 08/29/13  Intake/Output   Yesterday:  08/11 0701 - 08/12 0700 In: 2856 [P.O.:200; I.V.:345; IV Piggyback:425; TPN:1886] Out: 975 [Urine:975] This shift:    I/O last 3 completed shifts: In: 4041 [P.O.:200; I.V.:525; Other:10; NG/GT:90; IV CXKGYJEHU:314] Out: 2032 [Urine:975; Emesis/NG output:1050; Drains:7]    Physical Exam:  General: Pt awake/alert/oriented x4 in no acute distress  Chest: cta. No chest wall pain w good excursion  CV: S1S2 regularly irregular  Abdomen: Soft. Nondistended. Minimal TTP to RLQ with deep palpation. No evidence of peritonitis. No incarcerated hernias. Left gluteal drain with serous output(67m recorded yesterday).  Ext: RLE dressing, boot. No mjr edema. No cyanosis     Problem List:   Principal Problem:   Sepsis Active Problems:   HYPOTHYROIDISM   Iron deficiency anemia, unspecified   HYPERTENSION   Atrial flutter   Unspecified constipation   Metabolic acidosis   Diverticulitis of colon with perforation    Results:   Labs: Results for orders placed during the hospital encounter of 08/25/13 (from the past 48 hour(s))  GLUCOSE, CAPILLARY     Status: Abnormal   Collection Time    08/29/13 11:52 AM      Result Value Ref Range   Glucose-Capillary 156 (*) 70 - 99  mg/dL  GLUCOSE, CAPILLARY     Status: Abnormal   Collection Time    08/29/13  5:37 PM      Result Value Ref Range   Glucose-Capillary 114 (*) 70 - 99 mg/dL  GLUCOSE, CAPILLARY     Status: Abnormal   Collection Time    08/29/13 11:42 PM      Result Value Ref Range   Glucose-Capillary 177 (*) 70 - 99 mg/dL  PROTIME-INR     Status: Abnormal   Collection Time    08/30/13  4:32 AM      Result Value Ref Range   Prothrombin Time 15.8 (*) 11.6 - 15.2 seconds   INR 1.26  0.00 - 1.49  CBC     Status: Abnormal   Collection Time    08/30/13  4:32 AM      Result Value Ref Range   WBC 12.7 (*) 4.0 - 10.5 K/uL   RBC 3.75 (*) 3.87 - 5.11 MIL/uL   Hemoglobin 12.9  12.0 - 15.0 g/dL   HCT 38.6  36.0 - 46.0 %   MCV 102.9 (*) 78.0 - 100.0 fL   MCH 34.4 (*) 26.0 - 34.0 pg   MCHC 33.4  30.0 - 36.0 g/dL   RDW 12.5  11.5 - 15.5 %   Platelets 338  150 -  400 K/uL  BASIC METABOLIC PANEL     Status: Abnormal   Collection Time    08/30/13  4:32 AM      Result Value Ref Range   Sodium 136 (*) 137 - 147 mEq/L   Potassium 3.2 (*) 3.7 - 5.3 mEq/L   Chloride 95 (*) 96 - 112 mEq/L   CO2 29  19 - 32 mEq/L   Glucose, Bld 159 (*) 70 - 99 mg/dL   BUN 9  6 - 23 mg/dL   Creatinine, Ser 0.56  0.50 - 1.10 mg/dL   Calcium 8.3 (*) 8.4 - 10.5 mg/dL   GFR calc non Af Amer 83 (*) >90 mL/min   GFR calc Af Amer >90  >90 mL/min   Comment: (NOTE)     The eGFR has been calculated using the CKD EPI equation.     This calculation has not been validated in all clinical situations.     eGFR's persistently <90 mL/min signify possible Chronic Kidney     Disease.   Anion gap 12  5 - 15  MAGNESIUM     Status: None   Collection Time    08/30/13  4:32 AM      Result Value Ref Range   Magnesium 1.6  1.5 - 2.5 mg/dL  GLUCOSE, CAPILLARY     Status: Abnormal   Collection Time    08/30/13  5:53 AM      Result Value Ref Range   Glucose-Capillary 168 (*) 70 - 99 mg/dL  CLOSTRIDIUM DIFFICILE BY PCR     Status: None    Collection Time    08/30/13  6:09 AM      Result Value Ref Range   C difficile by pcr NEGATIVE  NEGATIVE  GLUCOSE, CAPILLARY     Status: Abnormal   Collection Time    08/30/13 11:50 AM      Result Value Ref Range   Glucose-Capillary 150 (*) 70 - 99 mg/dL   Comment 1 Documented in Chart    GLUCOSE, CAPILLARY     Status: None   Collection Time    08/30/13  3:59 PM      Result Value Ref Range   Glucose-Capillary 87  70 - 99 mg/dL  POTASSIUM     Status: None   Collection Time    08/30/13  6:30 PM      Result Value Ref Range   Potassium 3.9  3.7 - 5.3 mEq/L   Comment: SLIGHT HEMOLYSIS  MAGNESIUM     Status: None   Collection Time    08/30/13  6:30 PM      Result Value Ref Range   Magnesium 2.0  1.5 - 2.5 mg/dL  GLUCOSE, CAPILLARY     Status: Abnormal   Collection Time    08/30/13 11:44 PM      Result Value Ref Range   Glucose-Capillary 171 (*) 70 - 99 mg/dL  PROTIME-INR     Status: None   Collection Time    08/31/13  3:50 AM      Result Value Ref Range   Prothrombin Time 15.1  11.6 - 15.2 seconds   INR 1.19  0.00 - 1.49  MAGNESIUM     Status: None   Collection Time    08/31/13  3:50 AM      Result Value Ref Range   Magnesium 1.9  1.5 - 2.5 mg/dL  CBC WITH DIFFERENTIAL     Status: Abnormal   Collection Time  08/31/13  3:50 AM      Result Value Ref Range   WBC 12.0 (*) 4.0 - 10.5 K/uL   RBC 3.15 (*) 3.87 - 5.11 MIL/uL   Hemoglobin 11.4 (*) 12.0 - 15.0 g/dL   HCT 33.8 (*) 36.0 - 46.0 %   MCV 107.3 (*) 78.0 - 100.0 fL   MCH 36.2 (*) 26.0 - 34.0 pg   MCHC 33.7  30.0 - 36.0 g/dL   RDW 12.7  11.5 - 15.5 %   Platelets 327  150 - 400 K/uL   Neutrophils Relative % 78 (*) 43 - 77 %   Lymphocytes Relative 11 (*) 12 - 46 %   Monocytes Relative 9  3 - 12 %   Eosinophils Relative 2  0 - 5 %   Basophils Relative 0  0 - 1 %   Neutro Abs 9.4 (*) 1.7 - 7.7 K/uL   Lymphs Abs 1.3  0.7 - 4.0 K/uL   Monocytes Absolute 1.1 (*) 0.1 - 1.0 K/uL   Eosinophils Absolute 0.2  0.0 - 0.7  K/uL   Basophils Absolute 0.0  0.0 - 0.1 K/uL   RBC Morphology POLYCHROMASIA PRESENT     WBC Morphology TOXIC GRANULATION    COMPREHENSIVE METABOLIC PANEL     Status: Abnormal   Collection Time    08/31/13  3:50 AM      Result Value Ref Range   Sodium 136 (*) 137 - 147 mEq/L   Potassium 3.4 (*) 3.7 - 5.3 mEq/L   Chloride 96  96 - 112 mEq/L   CO2 27  19 - 32 mEq/L   Glucose, Bld 118 (*) 70 - 99 mg/dL   BUN 12  6 - 23 mg/dL   Creatinine, Ser 0.66  0.50 - 1.10 mg/dL   Calcium 8.1 (*) 8.4 - 10.5 mg/dL   Total Protein 6.0  6.0 - 8.3 g/dL   Albumin 2.1 (*) 3.5 - 5.2 g/dL   AST 21  0 - 37 U/L   ALT 21  0 - 35 U/L   Alkaline Phosphatase 84  39 - 117 U/L   Total Bilirubin 0.9  0.3 - 1.2 mg/dL   GFR calc non Af Amer 78 (*) >90 mL/min   GFR calc Af Amer >90  >90 mL/min   Comment: (NOTE)     The eGFR has been calculated using the CKD EPI equation.     This calculation has not been validated in all clinical situations.     eGFR's persistently <90 mL/min signify possible Chronic Kidney     Disease.   Anion gap 13  5 - 15  GLUCOSE, CAPILLARY     Status: Abnormal   Collection Time    08/31/13  6:15 AM      Result Value Ref Range   Glucose-Capillary 164 (*) 70 - 99 mg/dL    Imaging / Studies: Dg Abd Portable 1v  08/29/2013   CLINICAL DATA:  Confirm NG tube placement.  EXAM: PORTABLE ABDOMEN - 1 VIEW  COMPARISON:  CT 08/25/2013  FINDINGS: Nasogastric tube courses through the stomach with tip and side-port right of midline in the right upper quadrant likely over the distal stomach or proximal duodenum. A pigtail drainage catheter seen with tip coiled over the left pelvis over the expected region of patient's diverticular abscess. Mild contrast is present within the colon. There are multiple air-filled mildly dilated small bowel loops more prominent than on the recent CT scout film. Remainder of the exam  is unchanged.  IMPRESSION: Multiple air-filled mildly dilated small bowel loops worse compared  to the recent CT scout film. Findings may be due to obstruction related to the thick-walled small bowel loop in the right lower quadrant on the recent CT scan versus secondary ileus to patient's presumed diverticulitis.  Nasogastric tube with tip and side-port right of midline in the right upper quadrant likely over the distal stomach or proximal duodenum. Drainage catheter over the left pelvis.   Electronically Signed   By: Marin Olp M.D.   On: 08/29/2013 15:01    Medications / Allergies:  Scheduled Meds: . enoxaparin (LOVENOX) injection  85 mg Subcutaneous Q12H  . insulin aspart  0-9 Units Subcutaneous 4 times per day  . levothyroxine  12.5 mcg Intravenous Daily  . metoprolol  5 mg Intravenous 4 times per day  . piperacillin-tazobactam (ZOSYN)  IV  3.375 g Intravenous 3 times per day  . temazepam  30 mg Oral QHS   Continuous Infusions: . sodium chloride 10 mL/hr at 08/30/13 1900  . diltiazem (CARDIZEM) infusion 5 mg/hr (08/30/13 1900)  . Marland KitchenTPN (CLINIMIX-E) Adult 83 mL/hr at 08/30/13 1900   And  . fat emulsion 10 mL/hr at 08/30/13 1900   PRN Meds:.acetaminophen, acetaminophen, LORazepam, morphine injection, ondansetron (ZOFRAN) IV, ondansetron, sodium chloride  Antibiotics: Anti-infectives   Start     Dose/Rate Route Frequency Ordered Stop   08/28/13 1400  piperacillin-tazobactam (ZOSYN) IVPB 3.375 g     3.375 g 12.5 mL/hr over 240 Minutes Intravenous 3 times per day 08/28/13 1126     08/26/13 0800  ertapenem (INVANZ) 1 g in sodium chloride 0.9 % 50 mL IVPB  Status:  Discontinued     1 g 100 mL/hr over 30 Minutes Intravenous Every 24 hours 08/26/13 0747 08/28/13 1126   08/26/13 0200  ciprofloxacin (CIPRO) IVPB 400 mg  Status:  Discontinued     400 mg 200 mL/hr over 60 Minutes Intravenous Every 12 hours 08/25/13 1646 08/26/13 0747   08/25/13 1645  metroNIDAZOLE (FLAGYL) IVPB 500 mg  Status:  Discontinued     500 mg 100 mL/hr over 60 Minutes Intravenous Every 8 hours 08/25/13  1636 08/26/13 0747   08/25/13 1345  ciprofloxacin (CIPRO) IVPB 400 mg     400 mg 200 mL/hr over 60 Minutes Intravenous  Once 08/25/13 1344 08/25/13 1515   08/25/13 1345  metroNIDAZOLE (FLAGYL) IVPB 500 mg     500 mg 100 mL/hr over 60 Minutes Intravenous  Once 08/25/13 1344 08/25/13 1554       Assessment/Plan  (Hospital problems)  Atrial flutter-heparin/cardizem  HTN  Hypothyroidism  IDA  Chronic diastolic heart failure   (Principal problem)  sepsis  Abdominal pain, RLQ inflammatory process  Diverticulitis with abscess  -s/p IR drain placement 8/7  -she is slowly improving, we will progress her very slowly. Ileus resolved, tolerated sips of clears yesterday. -CT of abdomen and pelvis to evaluation abscess today -Cx E coli sensitive to both atbx listed below  -Invanz 8/7-8/9,changed to Zosyn 8/9  -TPN  -mobilize  -continue with conservative management, hopefully we can avoid surgery.    Erby Pian, [facility] Pager [phone] Office [phone]  08/31/2013 7:40 AM

## 2013-08-31 NOTE — Progress Notes (Signed)
Agree with the above

## 2013-08-31 NOTE — Progress Notes (Signed)
[place] TEAM 1 - Stepdown/ICU TEAM Progress Note  [name] WSF:[id number] DOB: 1925-01-23 DOA: 08/25/2013 PCP: Walker Kehr, MD  Admit HPI / Brief Narrative: 80 y.o. female w/ a past medical history of atrial fibrillation on chronic anticoagulation and recent fall which led to an open fracture of the lower extremity requiring surgical intervention and wound care with wound VAC. Patient was in skilled nursing for rehabilitation and started having L lower quadrant abdominal pain.   CT scan of the abdomen was noted for inflammatory changes in the pelvis and right lower quadrant with wall thickening of small bowel adjacent loops concerning for infectious ileitis and findings consistent with a possible ruptured diverticulitis and abscess. General Surgery was called for consultation. Lab work noted white blood cell count 21, anion gap of 16 with lactic acid level of 3.76. Patient was started on IV Cipro and Flagyl.   Significant Events: 8/7 - transgluteal percutaneous drainage in IR 8/7 - PICC line placement 8/8 - begin TNA   HPI/Subjective: Affect is flat and patient makes it clear she is getting very tired of being in the hospital and feeling sick.  She has no new specific complaints today.  She is not yet consistently passing gas or moving her bowels.    Assessment/Plan:  Diverticulitis of colon with perforation As per Gen Surg - s/p L transgluteal percutaneous drainage in IR 8/7 - diet per Gen Surg, anticipate very slow advancement - abscess cultures noting enterococcus which is pan sensitive as well as pan sensitive E coli - ertapenem was changed to zosyn previously as pharmacy suggested ertapenem provides poor enterococcus coverage - cont TNA - f/u CT suggest area of fluid near or ?connected to area where drain placed which appears to not be draining, though are w/ drain is smaller  Sepsis (infection, WBC 25, HR 112) Hemodynamically stable - WBC stable - follow   Metabolic  acidosis resolved w/ volume resuscitation   Hypokalemia  Persists - supplement again today  Hypomagnesemia Mg is normal today - follow   Chronic afib w/ acute RVR RVR resolved - continue w/ IV tx as oral intake not yet being advanced   Chronic diastolic CHF EF 01% on echogram in 2012 - currently euvolemic   UTI Current abx should provide adequate coverage - appears that urine was not sent for cx  HTN BP currently well controlled - follow  Hypothyroid  Cont home synthroid dose  Hx of Fe deficient anemia Fe studies most c/w "anemia of chronic disease" i.e. likely a result of inflammatory state and recent poor nutrition   Macrocytosis B12 normal/high June 2014 - recheck notes adequate stores (658)  Mechanical fall with left open tibial fracture June 2015 Cont current prescribed wound care (followed by Dr. Migdalia Dk) - dressing changed per Plastics today   Obesity Unspecified   Code Status: NO CODE BLUE  Family Communication: spoke w/ daughter at bedside  Disposition Plan: SDU - pt is being to show signs of fatigue and hopelessness - resume SSRI, but may soon have to address goals of care if she does not begin to "turn the corner"   Consultants: Gen Surgery  IR  Antibiotics: Cipro 8/06 Flagyl 8/06 Ertapenem 8/07 >>8/9 Zosyn 8/9 >>  DVT prophylaxis: lovenox   Objective: Blood pressure 128/94, pulse 107, temperature 98.2 F (36.8 C), temperature source Oral, resp. rate 23, height 5\' 3"  (1.6 m), weight 90.2 kg (198 lb 13.7 oz), SpO2 100.00%.  Intake/Output Summary (Last 24 hours) at 08/31/13 1543  Last data filed at 08/31/13 0800  Gross per 24 hour  Intake   2117 ml  Output    175 ml  Net   1942 ml   Exam: General: No acute respiratory distress - affect very flat  Lungs: Clear to auscultation bilaterally without wheezes or crackles Cardiovascular: irreg irreg - rate controlled - no appreciable M Abdomen: obese, soft, diffusely tender - no rebound - no  appreciable mass - BS noted  Extremities: No significant cyanosis, clubbing, or edema bilateral lower extremities - L LE wound dressed with VAC in place   Data Reviewed: Basic Metabolic Panel:  Recent Labs Lab 08/27/13 0445 08/28/13 0310 08/29/13 0325 08/30/13 0432 08/30/13 1830 08/31/13 0350  NA 137 136* 138 136*  --  136*  K 3.5* 4.3 3.5* 3.2* 3.9 3.4*  CL 102 102 100 95*  --  96  CO2 25 21 24 29   --  27  GLUCOSE 129* 127* 148* 159*  --  118*  BUN 14 13 10 9   --  12  CREATININE 0.88 0.77 0.68 0.56  --  0.66  CALCIUM 8.1* 8.4 8.3* 8.3*  --  8.1*  MG  --  2.1 1.7 1.6 2.0 1.9  PHOS  --  2.8 2.6  --   --   --    Liver Function Tests:  Recent Labs Lab 08/25/13 1008 08/26/13 0055 08/27/13 0445 08/29/13 0325 08/31/13 0350  AST 24 23 16 23 21   ALT 31 21 15 22 21   ALKPHOS 90 87 70 92 84  BILITOT 1.2 1.2 0.7 1.0 0.9  PROT 7.7 6.4 6.1 6.6 6.0  ALBUMIN 3.2* 2.5* 2.1* 2.3* 2.1*    Recent Labs Lab 08/25/13 1008  LIPASE 13   Coags:  Recent Labs Lab 08/27/13 0445 08/28/13 0310 08/29/13 0325 08/30/13 0432 08/31/13 0350  INR 1.51* 1.49 1.47 1.26 1.19   No results found for this basename: PTT,  in the last 168 hours  CBC:  Recent Labs Lab 08/27/13 0445 08/28/13 0310 08/29/13 0325 08/30/13 0432 08/31/13 0350  WBC 18.5* 13.7* 14.9* 12.7* 12.0*  NEUTROABS  --   --  12.8*  --  9.4*  HGB 10.2* 10.5* 11.3* 12.9 11.4*  HCT 31.7* 32.8* 33.7* 38.6 33.8*  MCV 106.7* 106.5* 106.6* 102.9* 107.3*  PLT 233 254 322 338 327   CBG:  Recent Labs Lab 08/30/13 1150 08/30/13 1559 08/30/13 2344 08/31/13 0615 08/31/13 1123  GLUCAP 150* 87 171* 164* 151*    Recent Results (from the past 240 hour(s))  ANAEROBIC CULTURE     Status: None   Collection Time    08/22/13  1:41 PM      Result Value Ref Range Status   Specimen Description LEG LEFT BONE CHIP   Final   Special Requests     Final   Value: PATIENT ON FOLLOWING CIPRO 400 MG PRIOR TO CASE STARTING   Gram Stain      Final   Value: NO WBC SEEN     NO SQUAMOUS EPITHELIAL CELLS SEEN     NO ORGANISMS SEEN     Performed at [facility]   Culture     Final   Value: NO ANAEROBES ISOLATED     Performed at [facility]   Report Status 08/27/2013 FINAL   Final  TISSUE CULTURE     Status: None   Collection Time    08/22/13  1:41 PM      Result Value Ref Range Status  Specimen Description LEG LEFT BONE CHIP   Final   Special Requests     Final   Value: PATIENT ON FOLLOWING CIPRO 400 MG PRIOR TO CASE STARTING   Gram Stain     Final   Value: NO WBC SEEN     NO SQUAMOUS EPITHELIAL CELLS SEEN     NO ORGANISMS SEEN     Performed at [facility]   Culture     Final   Value: FEW STAPHYLOCOCCUS AUREUS     Note: RIFAMPIN AND GENTAMICIN SHOULD NOT BE USED AS SINGLE DRUGS FOR TREATMENT OF STAPH INFECTIONS.     Performed at [facility]   Report Status 08/25/2013 FINAL   Final   Organism ID, Bacteria STAPHYLOCOCCUS AUREUS   Final  CULTURE, BLOOD (ROUTINE X 2)     Status: None   Collection Time    08/25/13  3:05 PM      Result Value Ref Range Status   Specimen Description BLOOD LEFT FOREARM   Final   Special Requests BOTTLES DRAWN AEROBIC ONLY 5CC   Final   Culture  Setup Time     Final   Value: 08/25/2013 18:56     Performed at [facility]   Culture     Final   Value: NO GROWTH 5 DAYS     Performed at [facility]   Report Status 08/31/2013 FINAL   Final  CULTURE, BLOOD (ROUTINE X 2)     Status: None   Collection Time    08/25/13  3:45 PM      Result Value Ref Range Status   Specimen Description BLOOD LEFT FOREARM   Final   Special Requests BOTTLES DRAWN AEROBIC ONLY 5CC   Final   Culture  Setup Time     Final   Value: 08/25/2013 20:08     Performed at [facility]   Culture     Final   Value: NO GROWTH 5 DAYS     Performed at [facility]   Report Status 08/31/2013 FINAL   Final  MRSA PCR SCREENING     Status: None   Collection  Time    08/25/13  6:02 PM      Result Value Ref Range Status   MRSA by PCR NEGATIVE  NEGATIVE Final   Comment:            The GeneXpert MRSA Assay (FDA     approved for NASAL specimens     only), is one component of a     comprehensive MRSA colonization     surveillance program. It is not     intended to diagnose MRSA     infection nor to guide or     monitor treatment for     MRSA infections.  SURGICAL PCR SCREEN     Status: None   Collection Time    08/26/13 10:27 AM      Result Value Ref Range Status   MRSA, PCR NEGATIVE  NEGATIVE Final   Staphylococcus aureus NEGATIVE  NEGATIVE Final   Comment:            The Xpert SA Assay (FDA     approved for NASAL specimens     in patients over 19 years of age),     is one component of     a comprehensive surveillance     program.  Test performance has     been validated by [facility] for patients greater     than or equal to 25 year old.     It is not intended     to diagnose infection nor to     guide or monitor treatment.  CULTURE, ROUTINE-ABSCESS     Status: None   Collection Time    08/26/13  2:04 PM      Result Value Ref Range Status   Specimen Description BUTTOCKS LEFT   Final   Special Requests LEFT UPPER BUTTOCK JP DRAIN   Final   Gram Stain     Final   Value: ABUNDANT WBC PRESENT,BOTH PMN AND MONONUCLEAR     NO SQUAMOUS EPITHELIAL CELLS SEEN     ABUNDANT GRAM POSITIVE RODS     ABUNDANT GRAM NEGATIVE RODS     FEW GRAM POSITIVE COCCI   Culture     Final   Value: MODERATE ESCHERICHIA COLI     MODERATE ENTEROCOCCUS SPECIES     Performed at [facility]   Report Status 08/29/2013 FINAL   Final   Organism ID, Bacteria ESCHERICHIA COLI   Final   Organism ID, Bacteria ENTEROCOCCUS SPECIES   Final  ANAEROBIC CULTURE     Status: None   Collection Time    08/26/13  2:04 PM      Result Value Ref Range Status   Specimen Description ABSCESS BUTTOCKS LEFT   Final   Special Requests LEFT UPPER BUTTOCK JP DRAIN    Final   Gram Stain     Final   Value: ABUNDANT WBC PRESENT,BOTH PMN AND MONONUCLEAR     NO SQUAMOUS EPITHELIAL CELLS SEEN     ABUNDANT GRAM POSITIVE RODS     ABUNDANT GRAM NEGATIVE RODS     FEW GRAM POSITIVE COCCI   Culture     Final   Value: NO ANAEROBES ISOLATED; CULTURE IN PROGRESS FOR 5 DAYS     Performed at [facility]   Report Status PENDING   Incomplete  CLOSTRIDIUM DIFFICILE BY PCR     Status: None   Collection Time    08/30/13  6:09 AM      Result Value Ref Range Status   C difficile by pcr NEGATIVE  NEGATIVE Final     Studies:  Recent x-ray studies have been reviewed in detail by the Attending Physician  Scheduled Meds:  Scheduled Meds: . enoxaparin (LOVENOX) injection  90 mg Subcutaneous Q12H  . gelatin adsorbable      . insulin aspart  0-9 Units Subcutaneous 4 times per day  . levothyroxine  12.5 mcg Intravenous Daily  . metoprolol  5 mg Intravenous 4 times per day  . piperacillin-tazobactam (ZOSYN)  IV  3.375 g Intravenous 3 times per day  . temazepam  30 mg Oral QHS    Time spent on care of this patient: 35 mins   MCCLUNG,JEFFREY T , MD   [facility] Office  [phone] Pager - Text Page per Shea Evans as per below:  On-Call/Text Page:      [email or web address]      password [email or web address]  If 7PM-7AM, please contact night-coverage [email or web address] Password [facility] 08/31/2013, 3:43 PM   LOS: 6 days

## 2013-08-31 NOTE — Progress Notes (Signed)
Physical Therapy Treatment Patient Details Name: [name] MRN: [id number] DOB: 1925-10-27 Today's Date: 08/31/2013    History of Present Illness Pt is an 80 y/o female admitted from SNF with abdominal pain. Was found to be septic and pt is s/p left pelvic/diverticular abscess drainage 8/7. PMH includes fall with open fracture on LLE 1 month ago. Pt was at [facility] s/p surgical intervention with wound vac. Pt states she is TDWB however MD note states NWB.     PT Comments    Pt continues to be limited during session by nausea/vomiting. Pt with good rehab effort and attempted standing x2. Was unable to achieve full standing, and pt had difficulty maintaining NWB status on LLE. Will continue to follow and progress as able per POC.   Follow Up Recommendations  SNF;Supervision/Assistance - 24 hour     Equipment Recommendations  None recommended by PT    Recommendations for Other Services       Precautions / Restrictions Precautions Precautions: Fall Precaution Comments: Wound vac, JP drain Restrictions Weight Bearing Restrictions: Yes LLE Weight Bearing: Non weight bearing    Mobility  Bed Mobility Overal bed mobility: Needs Assistance;+ 2 for safety/equipment Bed Mobility: Supine to Sit;Sit to Supine     Supine to sit: Mod assist;+2 for physical assistance Sit to supine: Mod assist;+2 for physical assistance   General bed mobility comments: VC's for sequencing and technique. Assist and increased time for pt to scoot to EOB enough for feet to rest on floor.   Transfers Overall transfer level: Needs assistance Equipment used: Rolling walker (2 wheeled) Transfers: Sit to/from Stand Sit to Stand: Max assist;+2 physical assistance         General transfer comment: Pt was able to attempt sit<>stand x2 today. She had difficulty maintaining NWB status on the LLE. Pt states she is TDWB however no specific orders for TDWB noted. Pt kept NWB as much as possible.    Ambulation/Gait             General Gait Details: Unable at this time.    Stairs            Wheelchair Mobility    Modified Rankin (Stroke Patients Only)       Balance Overall balance assessment: Needs assistance Sitting-balance support: Feet supported;No upper extremity supported Sitting balance-Leahy Scale: Fair     Standing balance support: Bilateral upper extremity supported Standing balance-Leahy Scale: Zero                      Cognition Arousal/Alertness: Awake/alert Behavior During Therapy: Anxious Overall Cognitive Status: Impaired/Different from baseline Area of Impairment: Memory;Problem solving     Memory: Decreased short-term memory       Problem Solving: Slow processing;Decreased initiation;Requires verbal cues;Difficulty sequencing      Exercises      General Comments        Pertinent Vitals/Pain Pain Assessment: No/denies pain    Home Living                      Prior Function            PT Goals (current goals can now be found in the care plan section) Acute Rehab PT Goals Patient Stated Goal: To return to [facility] at d/c for continued rehab PT Goal Formulation: With patient/family Time For Goal Achievement: 09/12/13 Potential to Achieve Goals: Good Progress towards PT goals: Progressing toward goals    Frequency  Min  2X/week    PT Plan Current plan remains appropriate    Co-evaluation             End of Session Equipment Utilized During Treatment: Gait belt;Oxygen Activity Tolerance: Patient tolerated treatment well Patient left: in bed;with call bell/phone within reach;with family/visitor present     Time: 1010-1035 PT Time Calculation (min): 25 min  Charges:  $Gait Training: 8-22 mins $Therapeutic Activity: 8-22 mins                    G Codes:      Jolyn Lent 09-02-13, 12:22 PM  Jolyn Lent, PT, DPT Acute Rehabilitation Services Pager: [phone]

## 2013-09-01 ENCOUNTER — Encounter (HOSPITAL_COMMUNITY): Payer: Self-pay

## 2013-09-01 LAB — COMPREHENSIVE METABOLIC PANEL
ALT: 25 U/L (ref 0–35)
AST: 27 U/L (ref 0–37)
Albumin: 2.2 g/dL — ABNORMAL LOW (ref 3.5–5.2)
Alkaline Phosphatase: 79 U/L (ref 39–117)
Anion gap: 9 (ref 5–15)
BUN: 13 mg/dL (ref 6–23)
CO2: 28 mEq/L (ref 19–32)
Calcium: 8.3 mg/dL — ABNORMAL LOW (ref 8.4–10.5)
Chloride: 96 mEq/L (ref 96–112)
Creatinine, Ser: 0.56 mg/dL (ref 0.50–1.10)
GFR calc Af Amer: >90 mL/min (ref >90)
GFR calc non Af Amer: 83 mL/min — ABNORMAL LOW (ref >90)
Glucose, Bld: 152 mg/dL — ABNORMAL HIGH (ref 70–99)
Potassium: 3.9 mEq/L (ref 3.7–5.3)
Sodium: 133 mEq/L — ABNORMAL LOW (ref 137–147)
Total Bilirubin: 0.7 mg/dL (ref 0.3–1.2)
Total Protein: 6.4 g/dL (ref 6.0–8.3)

## 2013-09-01 LAB — PROTIME-INR
INR: 1.18 (ref 0.00–1.49)
Prothrombin Time: 15 seconds (ref 11.6–15.2)

## 2013-09-01 LAB — CBC WITH DIFFERENTIAL/PLATELET
Basophils Absolute: 0.1 10*3/uL (ref 0.0–0.1)
Basophils Relative: 1 % (ref 0–1)
Eosinophils Absolute: 0.4 10*3/uL (ref 0.0–0.7)
Eosinophils Relative: 3 % (ref 0–5)
HCT: 31.5 % — ABNORMAL LOW (ref 36.0–46.0)
Hemoglobin: 10.9 g/dL — ABNORMAL LOW (ref 12.0–15.0)
Lymphocytes Relative: 12 % (ref 12–46)
Lymphs Abs: 1.4 10*3/uL (ref 0.7–4.0)
MCH: 36.2 pg — ABNORMAL HIGH (ref 26.0–34.0)
MCHC: 34.6 g/dL (ref 30.0–36.0)
MCV: 104.7 fL — ABNORMAL HIGH (ref 78.0–100.0)
Monocytes Absolute: 1.1 10*3/uL — ABNORMAL HIGH (ref 0.1–1.0)
Monocytes Relative: 9 % (ref 3–12)
Neutro Abs: 8.8 10*3/uL — ABNORMAL HIGH (ref 1.7–7.7)
Neutrophils Relative %: 75 % (ref 43–77)
Platelets: 352 10*3/uL (ref 150–400)
RBC: 3.01 MIL/uL — ABNORMAL LOW (ref 3.87–5.11)
RDW: 12.6 % (ref 11.5–15.5)
WBC: 11.8 10*3/uL — ABNORMAL HIGH (ref 4.0–10.5)

## 2013-09-01 LAB — PHOSPHORUS: Phosphorus: 2.8 mg/dL (ref 2.3–4.6)

## 2013-09-01 LAB — GLUCOSE, CAPILLARY
Glucose-Capillary: 142 mg/dL — ABNORMAL HIGH (ref 70–99)
Glucose-Capillary: 149 mg/dL — ABNORMAL HIGH (ref 70–99)
Glucose-Capillary: 158 mg/dL — ABNORMAL HIGH (ref 70–99)
Glucose-Capillary: 181 mg/dL — ABNORMAL HIGH (ref 70–99)
Glucose-Capillary: 197 mg/dL — ABNORMAL HIGH (ref 70–99)

## 2013-09-01 LAB — MAGNESIUM: Magnesium: 1.7 mg/dL (ref 1.5–2.5)

## 2013-09-01 MED ORDER — TRACE MINERALS CR-CU-F-FE-I-MN-MO-SE-ZN IV SOLN
INTRAVENOUS | Status: AC
  Administered 2013-09-01: 17:00:00 via INTRAVENOUS
  Filled 2013-09-01: qty 2000

## 2013-09-01 MED ORDER — FAT EMULSION 20 % IV EMUL
240.0000 mL | INTRAVENOUS | Status: AC
  Administered 2013-09-01: 240 mL via INTRAVENOUS
  Filled 2013-09-01: qty 250

## 2013-09-01 MED ORDER — ENOXAPARIN SODIUM 100 MG/ML ~~LOC~~ SOLN
85.0000 mg | Freq: Two times a day (BID) | SUBCUTANEOUS | Status: DC
  Administered 2013-09-01 – 2013-09-08 (×14): 85 mg via SUBCUTANEOUS
  Filled 2013-09-01 (×18): qty 1

## 2013-09-01 NOTE — Progress Notes (Signed)
[name] - Stepdown/ICU TEAM Progress Note  [name] HTD:[id number] DOB: February 25, 1925 DOA: 08/25/2013 PCP: Walker Kehr, MD  Admit HPI / Brief Narrative: 80 y.o. WF PMHx atrial fibrillation on chronic anticoagulation and recent fall which led to an open fracture of the lower extremity requiring surgical intervention and wound care with wound VAC. Patient was in skilled nursing for rehabilitation and started having L lower quadrant abdominal pain.  CT scan of the abdomen was noted for inflammatory changes in the pelvis and right lower quadrant with wall thickening of small bowel adjacent loops concerning for infectious ileitis and findings consistent with a possible ruptured diverticulitis and abscess. General Surgery was called for consultation. Lab work noted white blood cell count 21, anion gap of 16 with lactic acid level of 3.76. Patient was started on IV Cipro and Flagyl.    HPI/Subjective: 8/13 states decreased abdominal pain, negative CP, negative SOB, negative CVA tenderness   Assessment/Plan: Diverticulitis of colon with perforation  -As per Gen Surg  - s/p L transgluteal percutaneous drainage in IR 8/7  - diet per Gen Surg, anticipate very slow advancement  - Abscess cultures positive enterococcus which is pan sensitive as well as pan sensitive E coli  - Ertapenem was changed to zosyn previously as pharmacy suggested ertapenem provides poor enterococcus coverage  - cont TNA  -CT scan 8/11 shows area which current catheter may not be draining (see CT report below); treatment per surgery  Sepsis (resolving) -WBCs trending down   -NSR  Metabolic acidosis  -resolved w/ volume resuscitation   Chronic afib w/ acute RVR  -RVR resolved  - Continue Cardizem drip; transition to PO as soon as tolerated -Continue Lovenox  BID    Chronic diastolic CHF  -EF 72% on echogram in 2012 - currently euvolemic   HTN  -BP within AHA guidelines; when patient becomes more  ambulatory may have to back off of her BP medication   UTI  -Current abx should provide adequate coverage - appears that urine was not sent for cx   Hypothyroid  -Continue Synthroid 12.5 mcg daily    Hx of Fe deficient anemia  -Fe studies most c/w "anemia of chronic disease" i.e. likely a result of inflammatory state and recent poor nutrition   Macrocytosis  -B12 normal/high June 2014 - recheck notes adequate stores (658)   Mechanical fall with left open tibial fracture June 2015  Cont current prescribed wound care (followed by Dr. Migdalia Dk)   Hypokalemia -Potassium goal > 4   Hypomagnesemia -Magnesium goal > 2      Code Status:DNR Family Communication: no family present at time of exam Disposition Plan: Per surgery   Consultants: Dr. Rolm Bookbinder (surgery)    Procedure/Significant Events: 8/7 - transgluteal percutaneous drainage in IR  8/7 - PICC line placement  8/8 -continue TNA  8/11 CT abdomen pelvis with contrast -Abscess in lower left pelvis is smaller with drain in place. Most of remaining fluid and air is slightly posterior/lateral to the drainage catheter tip. Unsure remaining fluid connects to the area that has been decompressed.  -Evidence of generalized ileus. - moderate hiatal hernia.    Culture Cx E coli sensitive to both atbx listed below   Antibiotics: Cipro 8/06 >> stopped 8/7 Flagyl 8/06 >> stopped 8/7 Ertapenem 8/07 >> stopped 8/9  Zosyn 8/9 >>   DVT prophylaxis: IV heparin > lovenox    Devices 8/7 The superior medial buttocks JP drain   LINES / TUBES:  8/7 double-lumen midline PICC 8/10 20 ga left forearm   Continuous Infusions: . sodium chloride 10 mL/hr at 09/01/13 0800  . diltiazem (CARDIZEM) infusion 5 mg/hr (09/01/13 0800)  . Marland KitchenTPN (CLINIMIX-E) Adult 83 mL/hr at 09/01/13 0800   And  . fat emulsion 240 mL (09/01/13 0800)  . Marland KitchenTPN (CLINIMIX-E) Adult     And  . fat emulsion      Objective: VITAL SIGNS: Temp: 98.9  F (37.2 C) (08/13 0800) Temp src: Oral (08/13 0800) BP: 129/60 mmHg (08/13 0800) Pulse Rate: 99 (08/12 2351) SPO2; 98% on room air FIO2:   Intake/Output Summary (Last 24 hours) at 09/01/13 1136 Last data filed at 09/01/13 0800  Gross per 24 hour  Intake   2473 ml  Output   1905 ml  Net    568 ml     Exam: General: A./O. x4, NAD, NAD ,No acute respiratory distress Lungs: Clear to auscultation bilaterally without wheezes or crackles Cardiovascular: Regular rate and rhythm without murmur gallop or rub normal S1 and S2 Abdomen: Nontender, nondistended, hypoactive bowel sounds, no rebound, no ascites, no appreciable mass, left mild CVA tenderness appropriate for drain; left JP drain serous fluid Extremities: No significant cyanosis, clubbing, or edema bilateral lower extremities  Data Reviewed: Basic Metabolic Panel:  Recent Labs Lab 08/28/13 0310 08/29/13 0325 08/30/13 0432 08/30/13 1830 08/31/13 0350 09/01/13 0500 09/01/13 0900  NA 136* 138 136*  --  136*  --  133*  K 4.3 3.5* 3.2* 3.9 3.4*  --  3.9  CL 102 100 95*  --  96  --  96  CO2 21 24 29   --  27  --  28  GLUCOSE 127* 148* 159*  --  118*  --  152*  BUN 13 10 9   --  12  --  13  CREATININE 0.77 0.68 0.56  --  0.66  --  0.56  CALCIUM 8.4 8.3* 8.3*  --  8.1*  --  8.3*  MG 2.1 1.7 1.6 2.0 1.9  --  1.7  PHOS 2.8 2.6  --   --   --  2.8  --    Liver Function Tests:  Recent Labs Lab 08/26/13 0055 08/27/13 0445 08/29/13 0325 08/31/13 0350 09/01/13 0900  AST 23 16 23 21 27   ALT 21 15 22 21 25   ALKPHOS 87 70 92 84 79  BILITOT 1.2 0.7 1.0 0.9 0.7  PROT 6.4 6.1 6.6 6.0 6.4  ALBUMIN 2.5* 2.1* 2.3* 2.1* 2.2*   No results found for this basename: LIPASE, AMYLASE,  in the last 168 hours No results found for this basename: AMMONIA,  in the last 168 hours CBC:  Recent Labs Lab 08/28/13 0310 08/29/13 0325 08/30/13 0432 08/31/13 0350 09/01/13 0500  WBC 13.7* 14.9* 12.7* 12.0* 11.8*  NEUTROABS  --  12.8*  --   9.4* 8.8*  HGB 10.5* 11.3* 12.9 11.4* 10.9*  HCT 32.8* 33.7* 38.6 33.8* 31.5*  MCV 106.5* 106.6* 102.9* 107.3* 104.7*  PLT 254 322 338 327 352   Cardiac Enzymes: No results found for this basename: CKTOTAL, CKMB, CKMBINDEX, TROPONINI,  in the last 168 hours BNP (last 3 results) No results found for this basename: PROBNP,  in the last 8760 hours CBG:  Recent Labs Lab 08/31/13 0615 08/31/13 1123 08/31/13 1710 08/31/13 2356 09/01/13 0559  GLUCAP 164* 151* 160* 197* 181*    Recent Results (from the past 240 hour(s))  ANAEROBIC CULTURE     Status: None  Collection Time    08/22/13  1:41 PM      Result Value Ref Range Status   Specimen Description LEG LEFT BONE CHIP   Final   Special Requests     Final   Value: PATIENT ON FOLLOWING CIPRO 400 MG PRIOR TO CASE STARTING   Gram Stain     Final   Value: NO WBC SEEN     NO SQUAMOUS EPITHELIAL CELLS SEEN     NO ORGANISMS SEEN     Performed at [facility]   Culture     Final   Value: NO ANAEROBES ISOLATED     Performed at [facility]   Report Status 08/27/2013 FINAL   Final  TISSUE CULTURE     Status: None   Collection Time    08/22/13  1:41 PM      Result Value Ref Range Status   Specimen Description LEG LEFT BONE CHIP   Final   Special Requests     Final   Value: PATIENT ON FOLLOWING CIPRO 400 MG PRIOR TO CASE STARTING   Gram Stain     Final   Value: NO WBC SEEN     NO SQUAMOUS EPITHELIAL CELLS SEEN     NO ORGANISMS SEEN     Performed at [facility]   Culture     Final   Value: FEW STAPHYLOCOCCUS AUREUS     Note: RIFAMPIN AND GENTAMICIN SHOULD NOT BE USED AS SINGLE DRUGS FOR TREATMENT OF STAPH INFECTIONS.     Performed at [facility]   Report Status 08/25/2013 FINAL   Final   Organism ID, Bacteria STAPHYLOCOCCUS AUREUS   Final  CULTURE, BLOOD (ROUTINE X 2)     Status: None   Collection Time    08/25/13  3:05 PM      Result Value Ref Range Status   Specimen Description BLOOD LEFT  FOREARM   Final   Special Requests BOTTLES DRAWN AEROBIC ONLY 5CC   Final   Culture  Setup Time     Final   Value: 08/25/2013 18:56     Performed at [facility]   Culture     Final   Value: NO GROWTH 5 DAYS     Performed at [facility]   Report Status 08/31/2013 FINAL   Final  CULTURE, BLOOD (ROUTINE X 2)     Status: None   Collection Time    08/25/13  3:45 PM      Result Value Ref Range Status   Specimen Description BLOOD LEFT FOREARM   Final   Special Requests BOTTLES DRAWN AEROBIC ONLY 5CC   Final   Culture  Setup Time     Final   Value: 08/25/2013 20:08     Performed at [facility]   Culture     Final   Value: NO GROWTH 5 DAYS     Performed at [facility]   Report Status 08/31/2013 FINAL   Final  MRSA PCR SCREENING     Status: None   Collection Time    08/25/13  6:02 PM      Result Value Ref Range Status   MRSA by PCR NEGATIVE  NEGATIVE Final   Comment:            The GeneXpert MRSA Assay (FDA     approved for NASAL specimens     only), is one component of a     comprehensive MRSA colonization  surveillance program. It is not     intended to diagnose MRSA     infection nor to guide or     monitor treatment for     MRSA infections.  SURGICAL PCR SCREEN     Status: None   Collection Time    08/26/13 10:27 AM      Result Value Ref Range Status   MRSA, PCR NEGATIVE  NEGATIVE Final   Staphylococcus aureus NEGATIVE  NEGATIVE Final   Comment:            The Xpert SA Assay (FDA     approved for NASAL specimens     in patients over 46 years of age),     is one component of     a comprehensive surveillance     program.  Test performance has     been validated by [employer] for patients greater     than or equal to 42 year old.     It is not intended     to diagnose infection nor to     guide or monitor treatment.  CULTURE, ROUTINE-ABSCESS     Status: None   Collection Time    08/26/13  2:04 PM      Result Value Ref Range  Status   Specimen Description BUTTOCKS LEFT   Final   Special Requests LEFT UPPER BUTTOCK JP DRAIN   Final   Gram Stain     Final   Value: ABUNDANT WBC PRESENT,BOTH PMN AND MONONUCLEAR     NO SQUAMOUS EPITHELIAL CELLS SEEN     ABUNDANT GRAM POSITIVE RODS     ABUNDANT GRAM NEGATIVE RODS     FEW GRAM POSITIVE COCCI   Culture     Final   Value: MODERATE ESCHERICHIA COLI     MODERATE ENTEROCOCCUS SPECIES     Performed at [facility]   Report Status 08/29/2013 FINAL   Final   Organism ID, Bacteria ESCHERICHIA COLI   Final   Organism ID, Bacteria ENTEROCOCCUS SPECIES   Final  ANAEROBIC CULTURE     Status: None   Collection Time    08/26/13  2:04 PM      Result Value Ref Range Status   Specimen Description ABSCESS BUTTOCKS LEFT   Final   Special Requests LEFT UPPER BUTTOCK JP DRAIN   Final   Gram Stain     Final   Value: ABUNDANT WBC PRESENT,BOTH PMN AND MONONUCLEAR     NO SQUAMOUS EPITHELIAL CELLS SEEN     ABUNDANT GRAM POSITIVE RODS     ABUNDANT GRAM NEGATIVE RODS     FEW GRAM POSITIVE COCCI   Culture     Final   Value: NO ANAEROBES ISOLATED; CULTURE IN PROGRESS FOR 5 DAYS     Performed at [facility]   Report Status PENDING   Incomplete  CLOSTRIDIUM DIFFICILE BY PCR     Status: None   Collection Time    08/30/13  6:09 AM      Result Value Ref Range Status   C difficile by pcr NEGATIVE  NEGATIVE Final     Studies:  Recent x-ray studies have been reviewed in detail by the Attending Physician  Scheduled Meds:  Scheduled Meds: . busPIRone  7.5 mg Oral BID  . enoxaparin (LOVENOX) injection  90 mg Subcutaneous Q12H  . insulin aspart  0-9 Units Subcutaneous 4 times per day  . levothyroxine  12.5 mcg Intravenous Daily  .  metoprolol  5 mg Intravenous 4 times per day  . piperacillin-tazobactam (ZOSYN)  IV  3.375 g Intravenous 3 times per day  . temazepam  30 mg Oral QHS    Time spent on care of this patient: 40 mins   Allie Bossier , MD   [facility] Office  [phone] Pager [phone]  On-Call/Text Page:      [email or web address]      password [email or web address]  If 7PM-7AM, please contact night-coverage [email or web address] Password [facility] 09/01/2013, 11:36 AM   LOS: 7 days

## 2013-09-01 NOTE — Progress Notes (Signed)
Pharmacy: Lovenox  87yof continues on lovenox for afib while coumadin remains on hold. Weight has decreased to 83.2kg today so will adjust dose accordingly. Renal function is stable. Hgb slowly trending down, platelets stable. No bleeding reported.  Plan: 1) Change lovenox to 85mg  sq q12 2) Resume coumadin once surgery plans decided  Nena Jordan, PharmD, BCPS 09/01/2013, 11:43 AM

## 2013-09-01 NOTE — Progress Notes (Signed)
She still has some small abscesses but overall better, she has an ileus but is not obstructed clinically. Her wbc is slowly coming down. Her exam is normal today. She is nontender. She is on tna. I discussed indications for surgery and that she is approaching some of these.  She is however slowly improving and I think her morbidity with surgery is significant.  We discussed today continuing current course with abx.  Indication for surgery would be no more improvement or worsening.

## 2013-09-01 NOTE — Progress Notes (Signed)
PARENTERAL NUTRITION CONSULT NOTE - FOLLOW UP  Pharmacy Consult for TPN Indication: Diverticulitis s/p drain, inability to advance diet  Allergies  Allergen Reactions  . Atenolol Nausea And Vomiting  . Clarithromycin Nausea And Vomiting  . Codeine Sulfate Nausea Only  . Macrodantin Other (See Comments)    Long time ago - reaction unknown  . Percocet [Oxycodone-Acetaminophen] Nausea And Vomiting    Patient Measurements: Height: 5\' 3"  (160 cm) Weight: 198 lb 13.7 oz (90.2 kg) IBW/kg (Calculated) : 52.4 Adjusted Body Weight: 62.2 kg  Vital Signs: Temp: 98.9 F (37.2 C) (08/13 0800) Temp src: Oral (08/13 0800) BP: 129/60 mmHg (08/13 0800) Pulse Rate: 99 (08/12 2351) Intake/Output from previous day: 08/12 0701 - 08/13 0700 In: 2694 [I.V.:345; IV Piggyback:200; FGH:8299] Out: 2315 [Urine:2300; Drains:15] Intake/Output from this shift:    Labs:  Recent Labs  08/30/13 0432 08/31/13 0350 09/01/13 0500  WBC 12.7* 12.0* 11.8*  HGB 12.9 11.4* 10.9*  HCT 38.6 33.8* 31.5*  PLT 338 327 352  INR 1.26 1.19 1.18     Recent Labs  08/30/13 0432 08/30/13 1830 08/31/13 0350 09/01/13 0500  NA 136*  --  136*  --   K 3.2* 3.9 3.4*  --   CL 95*  --  96  --   CO2 29  --  27  --   GLUCOSE 159*  --  118*  --   BUN 9  --  12  --   CREATININE 0.56  --  0.66  --   CALCIUM 8.3*  --  8.1*  --   MG 1.6 2.0 1.9  --   PHOS  --   --   --  2.8  PROT  --   --  6.0  --   ALBUMIN  --   --  2.1*  --   AST  --   --  21  --   ALT  --   --  21  --   ALKPHOS  --   --  84  --   BILITOT  --   --  0.9  --    Estimated Creatinine Clearance: 54.8 ml/min (by C-G formula based on Cr of 0.66).    Recent Labs  08/31/13 1710 08/31/13 2356 09/01/13 0559  GLUCAP 160* 197* 181*     Insulin Requirements in the past 24 hours:  10 units SSI (CBGs 151-197)  Current Nutrition:  NPO  Nutritional Goals:  1750-1950 kCal, 110-125 grams of protein per day Goal TPN at 65ml/hr + lipids= 1894 kcal  and 99.6g protein daily.  Admit: Diverticulitis with abscess s/p drainage per IR  GI: Diverticulitis with abscess, drain placed 8/7. N/V 8/10. Triglycerides 73. Prealbumin 8. (Baseline albumin 3.2 on 08/24/13). +BM. Tolerated sips. Advance to CL 8/12 but none documented. Resumed NPO due to nausea and vomiting after ice chips.  Endo: Synthoid IV, SSI coverage for CBGs 151-197.  Lytes: Phos 2.8. (Goal K>4 and Mg>2 with afib). K+ replaced to 3.9 and but Mg down slightly to 1.7 today. Possibly refeeding.   Renal: IV fluids NS at kvo. UOP 1.1  Pulm: 100% on 2L [place]  Cards: Afib, HTN, dHF - Coumadin on hold and currently anticoagulated with Lovenox 90mg /12h. VSS on diltiazem IV, Toprol XL  Ortho: recent fall 6/15 which led to an open fracture of the lower extremity requiring surgical intervention and wound care with wound VAC.   Hepatobil: LFTs WNL.  Neuro/MSK: Restoril, Buspar,  ID:  [id number] D#8 for diverticulitis +  abscess with Enterococcus/Ecoli in culture from JP drain on Zosyn. BC from 8/6 negative  Best Practices: Lovenox  TPN Access: PICC line (8/7)  TPN day#: 5 (start date 8/9)  Plan:  1. Clinimix E 5/15 at 69ml/hr + 20% lipid emulsion at 10 ml/hr.(Goal)    Quanesha Klimaszewski S. Alford Highland, PharmD, [facility] Clinical Staff Pharmacist Pager [phone]  09/01/2013, 8:23 AM

## 2013-09-01 NOTE — Progress Notes (Signed)
Subjective: She says she doesn't have pain right now the pain comes and goes, she points to the right mid quadrant as the site that usually bothers her.  She did complain some of left lower quad pain during reexamine.  Vomiting a massive volume last PM, so she's NPO, meds held last PM. She said she just had ice chips when that occured  Objective: Vital signs in last 24 hours: Temp:  [98 F (36.7 C)-98.3 F (36.8 C)] 98.2 F (36.8 C) (08/13 0400) Pulse Rate:  [99] 99 (08/12 2351) Resp:  [17-23] 17 (08/13 0400) BP: (128-159)/(63-94) 143/66 mmHg (08/13 0400) SpO2:  [98 %-100 %] 100 % (08/13 0400) Last BM Date: 08/29/13 15 ml from drain yesterday ( this drainage this AM is clear serous) Afebrile, VSS WBC  11.8 Nothing on cultures at this point Intake/Output from previous day: 08/12 0701 - 08/13 0700 In: 2694 [I.V.:345; IV Piggyback:200; FYB:0175] Out: 2315 [Urine:2300; Drains:15] Intake/Output this shift:    General appearance: alert, cooperative, no distress and frail and fatigued. Resp: clear to auscultation bilaterally GI: soft not tender now, she did point to one site that was tender on the left side that her daughter said was new.  She has a few Bowel sounds.  Lab Results:   Recent Labs  08/31/13 0350 09/01/13 0500  WBC 12.0* 11.8*  HGB 11.4* 10.9*  HCT 33.8* 31.5*  PLT 327 352    BMET  Recent Labs  08/30/13 0432 08/30/13 1830 08/31/13 0350  NA 136*  --  136*  K 3.2* 3.9 3.4*  CL 95*  --  96  CO2 29  --  27  GLUCOSE 159*  --  118*  BUN 9  --  12  CREATININE 0.56  --  0.66  CALCIUM 8.3*  --  8.1*   PT/INR  Recent Labs  08/31/13 0350 09/01/13 0500  LABPROT 15.1 15.0  INR 1.19 1.18     Recent Labs Lab 08/25/13 1008 08/26/13 0055 08/27/13 0445 08/29/13 0325 08/31/13 0350  AST 24 23 16 23 21   ALT 31 21 15 22 21   ALKPHOS 90 87 70 92 84  BILITOT 1.2 1.2 0.7 1.0 0.9  PROT 7.7 6.4 6.1 6.6 6.0  ALBUMIN 3.2* 2.5* 2.1* 2.3* 2.1*     Lipase      Component Value Date/Time   LIPASE 13 08/25/2013 1008     Studies/Results: Ct Abdomen Pelvis W Contrast  08/31/2013   CLINICAL DATA:  Recent diverticular abscess  EXAM: CT ABDOMEN AND PELVIS WITH CONTRAST  TECHNIQUE: Multidetector CT imaging of the abdomen and pelvis was performed using the standard protocol following bolus administration of intravenous contrast. Oral contrast was also administered.  CONTRAST:  133mL OMNIPAQUE IOHEXOL 300 MG/ML  SOLN  COMPARISON:  August 25, 2013 and August 26, 2013  FINDINGS: There is patchy bibasilar lung atelectasis. There are bilateral pleural effusions with mild bibasilar consolidation posteriorly. There is a moderate hiatal hernia. There are multiple foci of coronary artery calcification.  There is fatty change in the liver. No focal liver lesions are identified. There is no biliary duct dilatation. Gallbladder wall is not thickened. There is a questionable tiny gallstone within the gallbladder.  Spleen and adrenals appear normal.  There is dilatation of the pancreatic duct in the region of the head and uncinate process measuring 8 mm, a stable finding. A there is no well-defined mass in this area. Elsewhere the pancreas appears unremarkable.  Kidneys bilaterally show no hydronephrosis or calculus on  either side. There is a 1 x 1 cm cyst in the mid to lower pole left kidney. There is a 6 x 6 mm cyst in the lower pole the right kidney. There is a 5 x 5 mm cyst in the mid left kidney. No noncystic renal masses are appreciable.  In the pelvis, there is a drain within a sigmoid diverticular abscess. The fluid collection is smaller compared to recent prior study. On the current examination, the abscess measures 4.3 x 2.6 cm. Most of the fluid which currently remains is located slightly posterior and medial to the distal aspect of the drainage catheter. Multiple sigmoid diverticula are present with muscular hypertrophy in the sigmoid colon.  There remains generalized bowel  dilatation suggesting ileus. No bowel obstruction is seen. There is no free air or portal venous air. There is mild wall thickening in the anterior left pelvis in the area noted previously. No new abscess is seen. Indeed, the area noted previously that was concerning for small abscess in the anterior right pelvis no longer appears to represent a focal abscess. No extra mural air is appreciable in this area, and all fluid in this area appears to reside within bowel. There is mild dilatation of the right fallopian tube which probably is secondary to the surrounding inflammation. This is a stable finding.  There is no appreciable ascites or adenopathy in the abdomen or pelvis. There is atherosclerotic change in aorta but no aneurysm. There is extensive arthropathy in the lumbar spine. There are no blastic or lytic bone lesions. There is a total hip prosthesis on the right. There is postoperative change in the lower lumbar spine with remodeling in the region of L4.  IMPRESSION: The abscess in the lower left pelvis is smaller with drain in place. Most of the remaining fluid and air is slightly posterior and lateral to the drainage catheter tip. It is difficult to say with certainty that this remaining fluid connects at this time to the area that has been decompressed by the catheter.  There is small bowel inflammation with wall thickening in the anterior right pelvis with less wall thickening compared to the previous study. The questionable second abscess seen in this area on the recent prior study is no longer appreciable as a discrete fluid collection with air.  There is evidence of generalized ileus. No bowel obstruction is seen.  There is a moderate hiatal hernia.  There are small pleural effusions with bibasilar consolidation. There is also patchy lung atelectatic change bilaterally.  There is a questionable tiny gallstone seen on axial slice 33 series 2.  There is a cystic area in the head and uncinate process of  the pancreas which may represent residua of previous pseudocyst. If there is concern for potential cystic pancreatic mass lesion in this area, MRI pre and post-contrast would be the imaging study of choice for further evaluation of this area.  Postoperative change in the lumbar spine with remodeling.   Electronically Signed   By: Lowella Grip M.D.   On: 08/31/2013 14:26    Medications: . busPIRone  7.5 mg Oral BID  . enoxaparin (LOVENOX) injection  90 mg Subcutaneous Q12H  . insulin aspart  0-9 Units Subcutaneous 4 times per day  . levothyroxine  12.5 mcg Intravenous Daily  . metoprolol  5 mg Intravenous 4 times per day  . piperacillin-tazobactam (ZOSYN)  IV  3.375 g Intravenous 3 times per day  . temazepam  30 mg Oral QHS   Scheduled  Meds: . busPIRone  7.5 mg Oral BID  . enoxaparin (LOVENOX) injection  90 mg Subcutaneous Q12H  . insulin aspart  0-9 Units Subcutaneous 4 times per day  . levothyroxine  12.5 mcg Intravenous Daily  . metoprolol  5 mg Intravenous 4 times per day  . piperacillin-tazobactam (ZOSYN)  IV  3.375 g Intravenous 3 times per day  . temazepam  30 mg Oral QHS   Continuous Infusions: . sodium chloride 10 mL/hr at 09/01/13 0800  . diltiazem (CARDIZEM) infusion 5 mg/hr (09/01/13 0800)  . Marland KitchenTPN (CLINIMIX-E) Adult 83 mL/hr at 09/01/13 0800   And  . fat emulsion 240 mL (09/01/13 0800)   PRN Meds:.acetaminophen, acetaminophen, LORazepam, morphine injection, ondansetron (ZOFRAN) IV, ondansetron, promethazine, sodium chloride  . sodium chloride 10 mL/hr at 09/01/13 0800  . diltiazem (CARDIZEM) infusion 5 mg/hr (09/01/13 0800)  . Marland KitchenTPN (CLINIMIX-E) Adult 83 mL/hr at 09/01/13 0800   And  . fat emulsion 240 mL (09/01/13 0800)   Prior to Admission medications   Medication Sig Start Date End Date Taking? Authorizing Provider  acetaminophen (TYLENOL) 500 MG tablet Take 500 mg by mouth daily as needed for pain.   Yes Historical Provider, MD  Amino Acids-Protein Hydrolys  (FEEDING SUPPLEMENT, PRO-STAT SUGAR FREE 64,) LIQD Take 30 mLs by mouth daily.   Yes Historical Provider, MD  atorvastatin (LIPITOR) 10 MG tablet Take 10 mg by mouth every evening.   Yes Historical Provider, MD  B Complex-C (B-COMPLEX WITH VITAMIN C) tablet Take 1 tablet by mouth daily.   Yes Historical Provider, MD  bisacodyl (DULCOLAX) 10 MG suppository Place 10 mg rectally once.   Yes Historical Provider, MD  busPIRone (BUSPAR) 7.5 MG tablet Take 7.5 mg by mouth 2 (two) times daily.   Yes Historical Provider, MD  Cholecalciferol (VITAMIN D3) 1000 UNITS tablet Take 1,000 Units by mouth daily.     Yes Historical Provider, MD  diltiazem (DILACOR XR) 120 MG 24 hr capsule Take 120 mg by mouth daily.   Yes Historical Provider, MD  Ferrous Sulfate (IRON) 325 (65 FE) MG TABS Take 1 tablet by mouth daily.   Yes Historical Provider, MD  furosemide (LASIX) 40 MG tablet Take 40 mg by mouth 2 (two) times daily.   Yes Historical Provider, MD  levothyroxine (SYNTHROID, LEVOTHROID) 25 MCG tablet Take 25 mcg by mouth daily before breakfast.   Yes Historical Provider, MD  LORazepam (ATIVAN) 0.5 MG tablet Take 0.5 mg by mouth every 8 (eight) hours as needed for anxiety.   Yes Historical Provider, MD  metoprolol succinate (TOPROL-XL) 50 MG 24 hr tablet Take 50 mg by mouth 2 (two) times daily. Take with or immediately following a meal.   Yes Historical Provider, MD  mineral oil enema Place 1 enema rectally daily as needed for mild constipation or severe constipation.   Yes Historical Provider, MD  pantoprazole (PROTONIX) 40 MG tablet Take 1 tablet (40 mg total) by mouth daily before breakfast. 01/19/13  Yes Milus Banister, MD  polyethylene glycol [facility] / GLYCOLAX) packet Take 17 g by mouth daily.   Yes Historical Provider, MD  potassium chloride SA (K-DUR,KLOR-CON) 20 MEQ tablet Take 1 tablet (20 mEq total) by mouth daily. 12/22/12  Yes Aleksei [name] V, MD  promethazine (PHENERGAN) 12.5 MG tablet Take 12.5 mg  by mouth every 6 (six) hours as needed for nausea or vomiting.   Yes Historical Provider, MD  senna-docusate (SENOKOT-S) 8.6-50 MG per tablet Take 1 tablet by mouth 2 (two)  times daily.   Yes Historical Provider, MD  temazepam (RESTORIL) 30 MG capsule Take 30 mg by mouth at bedtime.   Yes Historical Provider, MD  traMADol (ULTRAM) 50 MG tablet Take 50 mg by mouth every 6 (six) hours as needed for moderate pain.   Yes Historical Provider, MD  vitamin C (ASCORBIC ACID) 500 MG tablet Take 500 mg by mouth daily. Take with iron dose   Yes Historical Provider, MD  warfarin (COUMADIN) 2.5 MG tablet Take 2.5-5 mg by mouth daily. Take 2.5mg  Monday, Tuesday, Wednesday, Thursday, Friday and Saturday.  Take 5mg  on Sunday.   Yes Historical Provider, MD     Assessment/Plan Diverticulitis with multiple abscesses/Sepsis Day 1 cipro/flagyl Day 2-4 Invanz  Day 4-7 Zosyn today will be day 8 and on Zosyn S/p IR drain 08/26/13 Chronic Afib with RVR Hx of DCHF Hypertension Hypothyroid UTI Hx of anemia Fx with open left tibial fx 06/2013 Body mass index is 35.23 kg/(m^2).   Plan:  I talked to Dr. Barbie Banner in radiology and he said her would leave current drain in place.  If we wanted something more aggressive they could perhaps stick a needle and drain into the adjacent collection to aspirate it. It is to small for a separate drain. Continue current antibiotic rx, nothing on cultures at this point.    LOS: 7 days    Ethan Clayburn 09/01/2013

## 2013-09-02 LAB — CBC WITH DIFFERENTIAL/PLATELET
Band Neutrophils: 0 % (ref 0–10)
Basophils Absolute: 0 10*3/uL (ref 0.0–0.1)
Basophils Relative: 0 % (ref 0–1)
Blasts: 0 %
Eosinophils Absolute: 0.2 10*3/uL (ref 0.0–0.7)
Eosinophils Relative: 2 % (ref 0–5)
HCT: 32.4 % — ABNORMAL LOW (ref 36.0–46.0)
Hemoglobin: 10.6 g/dL — ABNORMAL LOW (ref 12.0–15.0)
Lymphocytes Relative: 9 % — ABNORMAL LOW (ref 12–46)
Lymphs Abs: 1.1 10*3/uL (ref 0.7–4.0)
MCH: 33.5 pg (ref 26.0–34.0)
MCHC: 32.7 g/dL (ref 30.0–36.0)
MCV: 102.5 fL — ABNORMAL HIGH (ref 78.0–100.0)
Metamyelocytes Relative: 0 %
Monocytes Absolute: 0.8 10*3/uL (ref 0.1–1.0)
Monocytes Relative: 7 % (ref 3–12)
Myelocytes: 0 %
Neutro Abs: 9.8 10*3/uL — ABNORMAL HIGH (ref 1.7–7.7)
Neutrophils Relative %: 82 % — ABNORMAL HIGH (ref 43–77)
Platelets: 364 10*3/uL (ref 150–400)
Promyelocytes Absolute: 0 %
RBC: 3.16 MIL/uL — ABNORMAL LOW (ref 3.87–5.11)
RDW: 12.4 % (ref 11.5–15.5)
WBC: 11.9 10*3/uL — ABNORMAL HIGH (ref 4.0–10.5)
nRBC: 0 /100 WBC

## 2013-09-02 LAB — COMPREHENSIVE METABOLIC PANEL
ALT: 37 U/L — ABNORMAL HIGH (ref 0–35)
AST: 30 U/L (ref 0–37)
Albumin: 2.2 g/dL — ABNORMAL LOW (ref 3.5–5.2)
Alkaline Phosphatase: 84 U/L (ref 39–117)
Anion gap: 10 (ref 5–15)
BUN: 13 mg/dL (ref 6–23)
CO2: 26 mEq/L (ref 19–32)
Calcium: 8.2 mg/dL — ABNORMAL LOW (ref 8.4–10.5)
Chloride: 98 mEq/L (ref 96–112)
Creatinine, Ser: 0.6 mg/dL (ref 0.50–1.10)
GFR calc Af Amer: >90 mL/min (ref >90)
GFR calc non Af Amer: 81 mL/min — ABNORMAL LOW (ref >90)
Glucose, Bld: 151 mg/dL — ABNORMAL HIGH (ref 70–99)
Potassium: 3.7 mEq/L (ref 3.7–5.3)
Sodium: 134 mEq/L — ABNORMAL LOW (ref 137–147)
Total Bilirubin: 0.6 mg/dL (ref 0.3–1.2)
Total Protein: 6.1 g/dL (ref 6.0–8.3)

## 2013-09-02 LAB — GLUCOSE, CAPILLARY
Glucose-Capillary: 142 mg/dL — ABNORMAL HIGH (ref 70–99)
Glucose-Capillary: 143 mg/dL — ABNORMAL HIGH (ref 70–99)
Glucose-Capillary: 159 mg/dL — ABNORMAL HIGH (ref 70–99)
Glucose-Capillary: 164 mg/dL — ABNORMAL HIGH (ref 70–99)
Glucose-Capillary: 176 mg/dL — ABNORMAL HIGH (ref 70–99)

## 2013-09-02 LAB — PROTIME-INR
INR: 1.16 (ref 0.00–1.49)
Prothrombin Time: 14.8 seconds (ref 11.6–15.2)

## 2013-09-02 LAB — ANAEROBIC CULTURE

## 2013-09-02 LAB — MAGNESIUM: Magnesium: 1.7 mg/dL (ref 1.5–2.5)

## 2013-09-02 MED ORDER — POTASSIUM CHLORIDE 10 MEQ/50ML IV SOLN
10.0000 meq | INTRAVENOUS | Status: AC
  Administered 2013-09-02 (×3): 10 meq via INTRAVENOUS
  Filled 2013-09-02 (×2): qty 50

## 2013-09-02 MED ORDER — MAGNESIUM SULFATE 40 MG/ML IJ SOLN
2.0000 g | Freq: Once | INTRAMUSCULAR | Status: AC
  Administered 2013-09-02: 2 g via INTRAVENOUS
  Filled 2013-09-02: qty 50

## 2013-09-02 MED ORDER — FAT EMULSION 20 % IV EMUL
240.0000 mL | INTRAVENOUS | Status: AC
  Administered 2013-09-02: 240 mL via INTRAVENOUS
  Filled 2013-09-02: qty 250

## 2013-09-02 MED ORDER — TRACE MINERALS CR-CU-F-FE-I-MN-MO-SE-ZN IV SOLN
INTRAVENOUS | Status: AC
  Administered 2013-09-02: 18:00:00 via INTRAVENOUS
  Filled 2013-09-02: qty 2000

## 2013-09-02 NOTE — Progress Notes (Signed)
[place] TEAM 1 - Stepdown/ICU TEAM Progress Note  [name] ATF:[id number] DOB: 1925/06/13 DOA: 08/25/2013 PCP: Walker Kehr, MD  Admit HPI / Brief Narrative: 80 y.o. WF PMHx atrial fibrillation on chronic anticoagulation and recent fall which led to an open fracture of the lower extremity requiring surgical intervention and wound care with wound VAC. Patient was in skilled nursing for rehabilitation and started having L lower quadrant abdominal pain.  CT scan of the abdomen was noted for inflammatory changes in the pelvis and right lower quadrant with wall thickening of small bowel adjacent loops concerning for infectious ileitis and findings consistent with a possible ruptured diverticulitis and abscess. General Surgery was called for consultation. Lab work noted white blood cell count 21, anion gap of 16 with lactic acid level of 3.76. Patient was started on IV Cipro and Flagyl.    HPI/Subjective: 8/14 sitting comfortably in chair, A./O. x4, states no bone pain, negative CP, negative SOB, negative CVA tenderness   Assessment/Plan: Diverticulitis of colon with perforation  -As per Gen Surg  - s/p L transgluteal percutaneous drainage in IR 8/7  - diet per Gen Surg, anticipate very slow advancement  - Abscess cultures positive enterococcus which is pan sensitive as well as pan sensitive E coli  - Ertapenem was changed to zosyn previously as pharmacy suggested ertapenem provides poor enterococcus coverage  - cont TNA  -CT scan 8/11 shows area which current catheter may not be draining (see CT report below); treatment per surgery  Sepsis (resolving) -WBCs stable    -NSR  Metabolic acidosis  -resolved w/ volume resuscitation   Chronic afib w/ acute RVR  -RVR resolved  - Continue Cardizem drip; will await surgeries okay to transition to PO  -Continue Lovenox  BID    Chronic diastolic CHF  -EF 27% on echogram in 2012 - currently euvolemic   HTN  -BP within AHA guidelines;  when patient becomes more ambulatory may have to back off of her BP medication   UTI  -Current abx should provide adequate coverage - appears that urine was not sent for cx   Hypothyroid  -Continue Synthroid 12.5 mcg daily    Hx of Fe deficient anemia  -Fe studies most c/w "anemia of chronic disease" i.e. likely a result of inflammatory state and recent poor nutrition   Macrocytosis  -B12 normal/high June 2014 - recheck notes adequate stores (658)   Mechanical fall with left open tibial fracture June 2015  Cont current prescribed wound care (followed by Dr. Migdalia Dk)   Hypokalemia -Potassium goal > 4   Hypomagnesemia -Magnesium goal > 2      Code Status:DNR Family Communication: family present at time of exam Disposition Plan: Per surgery   Consultants: Dr. Rolm Bookbinder (surgery)    Procedure/Significant Events: 8/7 - transgluteal percutaneous drainage in IR  8/7 - PICC line placement  8/8 -continue TNA  8/11 CT abdomen pelvis with contrast -Abscess in lower left pelvis is smaller with drain in place. Most of remaining fluid and air is slightly posterior/lateral to the drainage catheter tip. Unsure remaining fluid connects to the area that has been decompressed.  -Evidence of generalized ileus. - moderate hiatal hernia.    Culture 8/6 MRSA by PCR negative 8/7 left buttocks positive Escherichia coli and Enterococcus; pan sensitive   Antibiotics: Cipro 8/06 >> stopped 8/7 Flagyl 8/06 >> stopped 8/7 Ertapenem 8/07 >> stopped 8/9  Zosyn 8/9 >>   DVT prophylaxis: IV heparin > lovenox    Devices  8/7 The superior medial buttocks JP drain   LINES / TUBES:  8/7 double-lumen midline PICC 8/10 20 ga left forearm   Continuous Infusions: . sodium chloride 10 mL/hr at 09/01/13 1800  . diltiazem (CARDIZEM) infusion 5 mg/hr (09/02/13 0700)  . Marland KitchenTPN (CLINIMIX-E) Adult 83 mL/hr at 09/02/13 0700   And  . fat emulsion 10 mL/hr at 09/02/13 0700  . Marland KitchenTPN  (CLINIMIX-E) Adult     And  . fat emulsion      Objective: VITAL SIGNS: Temp: 98.4 F (36.9 C) (08/14 0750) Temp src: Oral (08/14 0750) BP: 120/53 mmHg (08/14 0800) Pulse Rate: 83 (08/14 0346) SPO2; 98% on room air FIO2:   Intake/Output Summary (Last 24 hours) at 09/02/13 1106 Last data filed at 09/02/13 0800  Gross per 24 hour  Intake 2404.62 ml  Output   1400 ml  Net 1004.62 ml     Exam: General: A./O. x4, NAD, NAD ,No acute respiratory distress, sitting in chair comfortably Lungs: Clear to auscultation bilaterally without wheezes or crackles Cardiovascular: Regular rhythm and rate, without murmur gallop or rub normal S1 and S2 Abdomen: Nontender, nondistended, hypoactive bowel sounds, no rebound, no ascites, no appreciable mass, left JP drain serous fluid Extremities: No significant cyanosis, clubbing, or edema bilateral lower extremities  Data Reviewed: Basic Metabolic Panel:  Recent Labs Lab 08/28/13 0310 08/29/13 0325 08/30/13 0432 08/30/13 1830 08/31/13 0350 09/01/13 0500 09/01/13 0900 09/02/13 0830  NA 136* 138 136*  --  136*  --  133* 134*  K 4.3 3.5* 3.2* 3.9 3.4*  --  3.9 3.7  CL 102 100 95*  --  96  --  96 98  CO2 21 24 29   --  27  --  28 26  GLUCOSE 127* 148* 159*  --  118*  --  152* 151*  BUN 13 10 9   --  12  --  13 13  CREATININE 0.77 0.68 0.56  --  0.66  --  0.56 0.60  CALCIUM 8.4 8.3* 8.3*  --  8.1*  --  8.3* 8.2*  MG 2.1 1.7 1.6 2.0 1.9  --  1.7 1.7  PHOS 2.8 2.6  --   --   --  2.8  --   --    Liver Function Tests:  Recent Labs Lab 08/27/13 0445 08/29/13 0325 08/31/13 0350 09/01/13 0900 09/02/13 0830  AST 16 23 21 27 30   ALT 15 22 21 25  37*  ALKPHOS 70 92 84 79 84  BILITOT 0.7 1.0 0.9 0.7 0.6  PROT 6.1 6.6 6.0 6.4 6.1  ALBUMIN 2.1* 2.3* 2.1* 2.2* 2.2*   No results found for this basename: LIPASE, AMYLASE,  in the last 168 hours No results found for this basename: AMMONIA,  in the last 168 hours CBC:  Recent Labs Lab  08/29/13 0325 08/30/13 0432 08/31/13 0350 09/01/13 0500 09/02/13 0830  WBC 14.9* 12.7* 12.0* 11.8* 11.9*  NEUTROABS 12.8*  --  9.4* 8.8* 9.8*  HGB 11.3* 12.9 11.4* 10.9* 10.6*  HCT 33.7* 38.6 33.8* 31.5* 32.4*  MCV 106.6* 102.9* 107.3* 104.7* 102.5*  PLT 322 338 327 352 364   Cardiac Enzymes: No results found for this basename: CKTOTAL, CKMB, CKMBINDEX, TROPONINI,  in the last 168 hours BNP (last 3 results) No results found for this basename: PROBNP,  in the last 8760 hours CBG:  Recent Labs Lab 09/01/13 1154 09/01/13 1627 09/01/13 2130 09/01/13 2334 09/02/13 0623  GLUCAP 158* 142* 149* 142* 164*  Recent Results (from the past 240 hour(s))  CULTURE, BLOOD (ROUTINE X 2)     Status: None   Collection Time    08/25/13  3:05 PM      Result Value Ref Range Status   Specimen Description BLOOD LEFT FOREARM   Final   Special Requests BOTTLES DRAWN AEROBIC ONLY 5CC   Final   Culture  Setup Time     Final   Value: 08/25/2013 18:56     Performed at [facility]   Culture     Final   Value: NO GROWTH 5 DAYS     Performed at [facility]   Report Status 08/31/2013 FINAL   Final  CULTURE, BLOOD (ROUTINE X 2)     Status: None   Collection Time    08/25/13  3:45 PM      Result Value Ref Range Status   Specimen Description BLOOD LEFT FOREARM   Final   Special Requests BOTTLES DRAWN AEROBIC ONLY 5CC   Final   Culture  Setup Time     Final   Value: 08/25/2013 20:08     Performed at [facility]   Culture     Final   Value: NO GROWTH 5 DAYS     Performed at [facility]   Report Status 08/31/2013 FINAL   Final  MRSA PCR SCREENING     Status: None   Collection Time    08/25/13  6:02 PM      Result Value Ref Range Status   MRSA by PCR NEGATIVE  NEGATIVE Final   Comment:            The GeneXpert MRSA Assay (FDA     approved for NASAL specimens     only), is one component of a     comprehensive MRSA colonization     surveillance program. It  is not     intended to diagnose MRSA     infection nor to guide or     monitor treatment for     MRSA infections.  SURGICAL PCR SCREEN     Status: None   Collection Time    08/26/13 10:27 AM      Result Value Ref Range Status   MRSA, PCR NEGATIVE  NEGATIVE Final   Staphylococcus aureus NEGATIVE  NEGATIVE Final   Comment:            The Xpert SA Assay (FDA     approved for NASAL specimens     in patients over 87 years of age),     is one component of     a comprehensive surveillance     program.  Test performance has     been validated by [employer] for patients greater     than or equal to 95 year old.     It is not intended     to diagnose infection nor to     guide or monitor treatment.  CULTURE, ROUTINE-ABSCESS     Status: None   Collection Time    08/26/13  2:04 PM      Result Value Ref Range Status   Specimen Description BUTTOCKS LEFT   Final   Special Requests LEFT UPPER BUTTOCK JP DRAIN   Final   Gram Stain     Final   Value: ABUNDANT WBC PRESENT,BOTH PMN AND MONONUCLEAR     NO SQUAMOUS EPITHELIAL CELLS SEEN     ABUNDANT  GRAM POSITIVE RODS     ABUNDANT GRAM NEGATIVE RODS     FEW GRAM POSITIVE COCCI   Culture     Final   Value: MODERATE ESCHERICHIA COLI     MODERATE ENTEROCOCCUS SPECIES     Performed at [facility]   Report Status 08/29/2013 FINAL   Final   Organism ID, Bacteria ESCHERICHIA COLI   Final   Organism ID, Bacteria ENTEROCOCCUS SPECIES   Final  ANAEROBIC CULTURE     Status: None   Collection Time    08/26/13  2:04 PM      Result Value Ref Range Status   Specimen Description ABSCESS BUTTOCKS LEFT   Final   Special Requests LEFT UPPER BUTTOCK JP DRAIN   Final   Gram Stain     Final   Value: ABUNDANT WBC PRESENT,BOTH PMN AND MONONUCLEAR     NO SQUAMOUS EPITHELIAL CELLS SEEN     ABUNDANT GRAM POSITIVE RODS     ABUNDANT GRAM NEGATIVE RODS     FEW GRAM POSITIVE COCCI   Culture     Final   Value: NO ANAEROBES ISOLATED; CULTURE IN  PROGRESS FOR 5 DAYS     Performed at [facility]   Report Status PENDING   Incomplete  CLOSTRIDIUM DIFFICILE BY PCR     Status: None   Collection Time    08/30/13  6:09 AM      Result Value Ref Range Status   C difficile by pcr NEGATIVE  NEGATIVE Final     Studies:  Recent x-ray studies have been reviewed in detail by the Attending Physician  Scheduled Meds:  Scheduled Meds: . busPIRone  7.5 mg Oral BID  . enoxaparin (LOVENOX) injection  85 mg Subcutaneous Q12H  . insulin aspart  0-9 Units Subcutaneous 4 times per day  . levothyroxine  12.5 mcg Intravenous Daily  . magnesium sulfate 1 - 4 g bolus IVPB  2 g Intravenous Once  . metoprolol  5 mg Intravenous 4 times per day  . piperacillin-tazobactam (ZOSYN)  IV  3.375 g Intravenous 3 times per day  . potassium chloride  10 mEq Intravenous Q1 Hr x 3  . temazepam  30 mg Oral QHS    Time spent on care of this patient: 40 mins   Allie Bossier , MD   [facility] Office  [phone] Pager - [phone]  On-Call/Text Page:      [email or web address]      password [email or web address]  If 7PM-7AM, please contact night-coverage [email or web address] Password [email or web address] 09/02/2013, 11:06 AM   LOS: 8 days

## 2013-09-02 NOTE — Progress Notes (Signed)
Physical Therapy Treatment Patient Details Name: [name] MRN: [id number] DOB: 1926/01/19 Today's Date: 09/02/2013    History of Present Illness Pt is an 80 y/o female admitted from SNF with abdominal pain. Was found to be septic and pt is s/p left pelvic/diverticular abscess drainage 8/7. PMH includes fall with open fracture on LLE 1 month ago. Pt was at [facility] s/p surgical intervention with wound vac. Pt states she is TDWB however MD note states NWB.     PT Comments    Pt progressing slowly towards physical therapy goals. States she was able to eat some breakfast this morning and tolerated well, as she has been very nauseated the past 2 sessions which has limited mobility. Pt transferred bed>BSC>recliner with +2 assist. Per RN request, lift pad under pt for return to bed. Recommended 2 hours in the chair if pt can tolerate, and discussed pressure relief techniques to be performed every 30 minutes.   Follow Up Recommendations  SNF;Supervision/Assistance - 24 hour     Equipment Recommendations  None recommended by PT    Recommendations for Other Services       Precautions / Restrictions Precautions Precautions: Fall Precaution Comments: Wound vac, JP drain Restrictions Weight Bearing Restrictions: Yes LLE Weight Bearing: Non weight bearing    Mobility  Bed Mobility Overal bed mobility: Needs Assistance Bed Mobility: Supine to Sit     Supine to sit: Min assist     General bed mobility comments: VC's for sequencing and technique. Assist and increased time for pt to scoot to EOB enough for feet to rest on floor.   Transfers Overall transfer level: Needs assistance Equipment used: Rolling walker (2 wheeled);None Transfers: Sit to/from [facility] Sit to Stand: Max assist;+2 physical assistance Stand pivot transfers: Max assist;Total assist;+2 physical assistance       General transfer comment: Pt able to transition bed>BSC with RW and +2 assist. Unable  to maintain NWB on the LLE. From [facility] to recliner, 2 person assist was utilized without the walker for a smoother transition. Pt continued to struggle with maintaining her WB precautions and was more of a total assist +2 to the recliner.  Ambulation/Gait             General Gait Details: Unable at this time.    Stairs            Wheelchair Mobility    Modified Rankin (Stroke Patients Only)       Balance Overall balance assessment: Needs assistance Sitting-balance support: Feet supported;Bilateral upper extremity supported Sitting balance-Leahy Scale: Fair     Standing balance support: Bilateral upper extremity supported Standing balance-Leahy Scale: Zero                      Cognition Arousal/Alertness: Lethargic Behavior During Therapy: Anxious Overall Cognitive Status: Impaired/Different from baseline Area of Impairment: Memory;Problem solving     Memory: Decreased short-term memory       Problem Solving: Slow processing;Decreased initiation;Requires verbal cues;Difficulty sequencing      Exercises      General Comments        Pertinent Vitals/Pain Pain Assessment: No/denies pain    Home Living                      Prior Function            PT Goals (current goals can now be found in the care plan section) Acute Rehab PT Goals Patient  Stated Goal: To return to [facility] at d/c for continued rehab PT Goal Formulation: With patient/family Time For Goal Achievement: 09/12/13 Potential to Achieve Goals: Good Progress towards PT goals: Progressing toward goals    Frequency  Min 2X/week    PT Plan Current plan remains appropriate    Co-evaluation             End of Session Equipment Utilized During Treatment: Gait belt;Oxygen Activity Tolerance: Patient limited by fatigue Patient left: in chair;with call bell/phone within reach;with nursing/sitter in room;with family/visitor present     Time: 4481-8563 PT Time  Calculation (min): 39 min  Charges:  $Gait Training: 8-22 mins $Therapeutic Activity: 23-37 mins                    G Codes:      Jolyn Lent 21-Sep-2013, 11:38 AM  Jolyn Lent, PT, DPT Acute Rehabilitation Services Pager: [phone]

## 2013-09-02 NOTE — Progress Notes (Signed)
PARENTERAL NUTRITION CONSULT NOTE - FOLLOW UP  Pharmacy Consult for TPN Indication: Diverticulitis s/p drain, inability to advance diet  Allergies  Allergen Reactions  . Atenolol Nausea And Vomiting  . Clarithromycin Nausea And Vomiting  . Codeine Sulfate Nausea Only  . Macrodantin Other (See Comments)    Long time ago - reaction unknown  . Percocet [Oxycodone-Acetaminophen] Nausea And Vomiting    Patient Measurements: Height: 5\' 3"  (160 cm) Weight: 183 lb 6.8 oz (83.2 kg) IBW/kg (Calculated) : 52.4 Adjusted Body Weight: 62.2 kg  Vital Signs: Temp: 98.4 F (36.9 C) (08/14 0750) Temp src: Oral (08/14 0750) BP: 140/70 mmHg (08/14 0400) Pulse Rate: 83 (08/14 0346) Intake/Output from previous day: 08/13 0701 - 08/14 0700 In: 2424.6 [I.V.:330; IV Piggyback:50; TPN:2044.6] Out: 1525 [Urine:1525] Intake/Output from this shift:    Labs:  Recent Labs  08/31/13 0350 09/01/13 0500 09/02/13 0615  WBC 12.0* 11.8*  --   HGB 11.4* 10.9*  --   HCT 33.8* 31.5*  --   PLT 327 352  --   INR 1.19 1.18 1.16     Recent Labs  08/30/13 1830 08/31/13 0350 09/01/13 0500 09/01/13 0900  NA  --  136*  --  133*  K 3.9 3.4*  --  3.9  CL  --  96  --  96  CO2  --  27  --  28  GLUCOSE  --  118*  --  152*  BUN  --  12  --  13  CREATININE  --  0.66  --  0.56  CALCIUM  --  8.1*  --  8.3*  MG 2.0 1.9  --  1.7  PHOS  --   --  2.8  --   PROT  --  6.0  --  6.4  ALBUMIN  --  2.1*  --  2.2*  AST  --  21  --  27  ALT  --  21  --  25  ALKPHOS  --  84  --  79  BILITOT  --  0.9  --  0.7   Estimated Creatinine Clearance: 52.5 ml/min (by C-G formula based on Cr of 0.56).    Recent Labs  09/01/13 2130 09/01/13 2334 09/02/13 0623  GLUCAP 149* 142* 164*    Insulin Requirements in the past 24 hours:  8 units SSI  Current Nutrition:  Clinimix E 5/15 at 40ml/hr + 20% lipid emulsion at 10 ml/hr- providing 100g protein (meeting 90% goal) and 1894kCal (meeting 100% goal) daily  Clear  liquid diet- has eaten ~40-50% of meals  Nutritional Goals:  1750-1950 kCal, 110-125 grams of protein per day  Admit: Diverticulitis with abscess s/p drainage per IR  GI: Diverticulitis with abscess, drain placed 8/7. Tolerating clears without n/v after episode of vomiting while on ice chips. Continuing on clears- may start advancing soon as tolerated. Patient told me she did not feel that she had much of an appetite and was forcing herself to eat- was not nauseous. Last BM 8/10. Prealbumin 8 (low) on 8/10  Endo: Synthoid IV, last TSH from June was normal. CBGs 142-181  Lytes: K 3.7, mag 1.7, (Goal K>4 and Mg>2 with afib). Phos 2.8 yesterday. Was possibly showing some signs of refeeding, but appears to have resolved  Renal: IV fluids- NS at [facility] UOP 0.8, SCr 0.5- stable  Pulm: 99% on 2L [place]  Cards: Afib, HTN, dHF - Coumadin on hold and currently anticoagulated with Lovenox 90mg /12h. VSS on diltiazem IV, Toprol  XL  Ortho: recent fall 6/15 which led to an open fracture of the lower extremity requiring surgical intervention and wound care with wound VAC.   Hepatobil: LFTs WNL except for low albumin. Trigs 73 on 8/10  Neuro/MSK: Restoril, Buspar  ID:  [id number] D#[id number] for diverticulitis + abscess with Enterococcus/Ecoli in culture from JP drain on Zosyn. BC from 8/6 negative  Best Practices: Lovenox  TPN Access: PICC line (8/7) TPN day#: 6 (start date 8/9)  Plan:  1. Will replace lytes with KCl 33mEq runs x3 for goal >4 and magnesium 2g IV x1 for goal >2 2. Reduce Clinimix E 5/15 to 26ml/hr + lipid 20% emulsion at 10 ml/hr- this will provide 84g protein and 1673kCal daily. Hopeful reduction in rate will stimulate appetite 3. Continue trace elements and daily multivitamin in TPN 4. Continue CBGs and SSI as ordered 5. Continue IVF as ordered 6. CMET, magnesium and phosphorus tomorrow morning 7. Follow up tolerance of diet progression, appetite stimulation  [name], PharmD,  BCPS Clinical Pharmacist Pager: [phone] 09/02/2013 8:36 AM

## 2013-09-02 NOTE — Progress Notes (Signed)
Subjective: Tol clears no n/v, no pain  Objective: Vital signs in last 24 hours: Temp:  [98.1 F (36.7 C)-99.2 F (37.3 C)] 98.5 F (36.9 C) (08/14 0346) Pulse Rate:  [71-93] 83 (08/14 0346) Resp:  [17-25] 17 (08/14 0400) BP: (112-163)/(60-88) 140/70 mmHg (08/14 0400) SpO2:  [98 %-100 %] 99 % (08/14 0400) Weight:  [183 lb 6.8 oz (83.2 kg)] 183 lb 6.8 oz (83.2 kg) (08/13 1100) Last BM Date: 08/29/13  Intake/Output from previous day: 08/13 0701 - 08/14 0700 In: 2424.6 [I.V.:330; IV Piggyback:50; TPN:2044.6] Out: 1525 [Urine:1525] Intake/Output this shift:    GI: drain with serous fluid, nontender  Lab Results:   Recent Labs  08/31/13 0350 09/01/13 0500  WBC 12.0* 11.8*  HGB 11.4* 10.9*  HCT 33.8* 31.5*  PLT 327 352   BMET  Recent Labs  08/31/13 0350 09/01/13 0900  NA 136* 133*  K 3.4* 3.9  CL 96 96  CO2 27 28  GLUCOSE 118* 152*  BUN 12 13  CREATININE 0.66 0.56  CALCIUM 8.1* 8.3*   PT/INR  Recent Labs  09/01/13 0500 09/02/13 0615  LABPROT 15.0 14.8  INR 1.18 1.16   ABG No results found for this basename: PHART, PCO2, PO2, HCO3,  in the last 72 hours  Studies/Results: Ct Abdomen Pelvis W Contrast  08/31/2013   CLINICAL DATA:  Recent diverticular abscess  EXAM: CT ABDOMEN AND PELVIS WITH CONTRAST  TECHNIQUE: Multidetector CT imaging of the abdomen and pelvis was performed using the standard protocol following bolus administration of intravenous contrast. Oral contrast was also administered.  CONTRAST:  181mL OMNIPAQUE IOHEXOL 300 MG/ML  SOLN  COMPARISON:  August 25, 2013 and August 26, 2013  FINDINGS: There is patchy bibasilar lung atelectasis. There are bilateral pleural effusions with mild bibasilar consolidation posteriorly. There is a moderate hiatal hernia. There are multiple foci of coronary artery calcification.  There is fatty change in the liver. No focal liver lesions are identified. There is no biliary duct dilatation. Gallbladder wall is not  thickened. There is a questionable tiny gallstone within the gallbladder.  Spleen and adrenals appear normal.  There is dilatation of the pancreatic duct in the region of the head and uncinate process measuring 8 mm, a stable finding. A there is no well-defined mass in this area. Elsewhere the pancreas appears unremarkable.  Kidneys bilaterally show no hydronephrosis or calculus on either side. There is a 1 x 1 cm cyst in the mid to lower pole left kidney. There is a 6 x 6 mm cyst in the lower pole the right kidney. There is a 5 x 5 mm cyst in the mid left kidney. No noncystic renal masses are appreciable.  In the pelvis, there is a drain within a sigmoid diverticular abscess. The fluid collection is smaller compared to recent prior study. On the current examination, the abscess measures 4.3 x 2.6 cm. Most of the fluid which currently remains is located slightly posterior and medial to the distal aspect of the drainage catheter. Multiple sigmoid diverticula are present with muscular hypertrophy in the sigmoid colon.  There remains generalized bowel dilatation suggesting ileus. No bowel obstruction is seen. There is no free air or portal venous air. There is mild wall thickening in the anterior left pelvis in the area noted previously. No new abscess is seen. Indeed, the area noted previously that was concerning for small abscess in the anterior right pelvis no longer appears to represent a focal abscess. No extra mural air is appreciable  in this area, and all fluid in this area appears to reside within bowel. There is mild dilatation of the right fallopian tube which probably is secondary to the surrounding inflammation. This is a stable finding.  There is no appreciable ascites or adenopathy in the abdomen or pelvis. There is atherosclerotic change in aorta but no aneurysm. There is extensive arthropathy in the lumbar spine. There are no blastic or lytic bone lesions. There is a total hip prosthesis on the right.  There is postoperative change in the lower lumbar spine with remodeling in the region of L4.  IMPRESSION: The abscess in the lower left pelvis is smaller with drain in place. Most of the remaining fluid and air is slightly posterior and lateral to the drainage catheter tip. It is difficult to say with certainty that this remaining fluid connects at this time to the area that has been decompressed by the catheter.  There is small bowel inflammation with wall thickening in the anterior right pelvis with less wall thickening compared to the previous study. The questionable second abscess seen in this area on the recent prior study is no longer appreciable as a discrete fluid collection with air.  There is evidence of generalized ileus. No bowel obstruction is seen.  There is a moderate hiatal hernia.  There are small pleural effusions with bibasilar consolidation. There is also patchy lung atelectatic change bilaterally.  There is a questionable tiny gallstone seen on axial slice 33 series 2.  There is a cystic area in the head and uncinate process of the pancreas which may represent residua of previous pseudocyst. If there is concern for potential cystic pancreatic mass lesion in this area, MRI pre and post-contrast would be the imaging study of choice for further evaluation of this area.  Postoperative change in the lumbar spine with remodeling.   Electronically Signed   By: Lowella Grip M.D.   On: 08/31/2013 14:26    Anti-infectives: Anti-infectives   Start     Dose/Rate Route Frequency Ordered Stop   08/28/13 1400  piperacillin-tazobactam (ZOSYN) IVPB 3.375 g     3.375 g 12.5 mL/hr over 240 Minutes Intravenous 3 times per day 08/28/13 1126     08/26/13 0800  ertapenem (INVANZ) 1 g in sodium chloride 0.9 % 50 mL IVPB  Status:  Discontinued     1 g 100 mL/hr over 30 Minutes Intravenous Every 24 hours 08/26/13 0747 08/28/13 1126   08/26/13 0200  ciprofloxacin (CIPRO) IVPB 400 mg  Status:  Discontinued      400 mg 200 mL/hr over 60 Minutes Intravenous Every 12 hours 08/25/13 1646 08/26/13 0747   08/25/13 1645  metroNIDAZOLE (FLAGYL) IVPB 500 mg  Status:  Discontinued     500 mg 100 mL/hr over 60 Minutes Intravenous Every 8 hours 08/25/13 1636 08/26/13 0747   08/25/13 1345  ciprofloxacin (CIPRO) IVPB 400 mg     400 mg 200 mL/hr over 60 Minutes Intravenous  Once 08/25/13 1344 08/25/13 1515   08/25/13 1345  metroNIDAZOLE (FLAGYL) IVPB 500 mg     500 mg 100 mL/hr over 60 Minutes Intravenous  Once 08/25/13 1344 08/25/13 1554      Assessment/Plan: Complicated diverticulitis  1. Labs pending today but clinically she is very slowly improving. I have discussed surgery with she and her family but we have decided as long as she is responding (albeit slowly) to medical therapy and drain we would continue. Her bowel function has improved, wbc has been getting better  and her exam is benign now. Will continue tna, stay on clears for today may be able to give more soon, physical therapy and hopefully this resolves. She understands there still may be role for surgery but I think outcome of that will not be favorable  [facility] 09/02/2013

## 2013-09-02 NOTE — Progress Notes (Signed)
  Subjective: L TG abscess drain placed 8/7 Pt doing some better Eating liquids now  Objective: Vital signs in last 24 hours: Temp:  [98.1 F (36.7 C)-98.6 F (37 C)] 98.3 F (36.8 C) (08/14 1125) Pulse Rate:  [71-93] 83 (08/14 0346) Resp:  [17-24] 21 (08/14 1125) BP: (120-163)/(53-88) 126/53 mmHg (08/14 1125) SpO2:  [98 %-100 %] 98 % (08/14 1125) Last BM Date: 09/02/13  Intake/Output from previous day: 08/13 0701 - 08/14 0700 In: 2522.6 [I.V.:345; IV Piggyback:50; TPN:2127.6] Out: 1525 [Urine:1525] Intake/Output this shift: Total I/O In: 98 [I.V.:15; TPN:83] Out: 200 [Urine:200]  PE:  Afeb; vss L TG drain intact Site clean and dry; NT Output minimal x 2 days 10 cc serous fluid in JP Cx Ecoli CT 8/12: abscess resolving; remaining fluid lies post/lateral to catheter tip. Few other areas noted. Rec: pt is better---re scan in 7-10 days unless worsens.   Lab Results:   Recent Labs  09/01/13 0500 09/02/13 0830  WBC 11.8* 11.9*  HGB 10.9* 10.6*  HCT 31.5* 32.4*  PLT 352 364   BMET  Recent Labs  09/01/13 0900 09/02/13 0830  NA 133* 134*  K 3.9 3.7  CL 96 98  CO2 28 26  GLUCOSE 152* 151*  BUN 13 13  CREATININE 0.56 0.60  CALCIUM 8.3* 8.2*   PT/INR  Recent Labs  09/01/13 0500 09/02/13 0615  LABPROT 15.0 14.8  INR 1.18 1.16   ABG No results found for this basename: PHART, PCO2, PO2, HCO3,  in the last 72 hours  Studies/Results: No results found.  Anti-infectives: Anti-infectives   Start     Dose/Rate Route Frequency Ordered Stop   08/28/13 1400  piperacillin-tazobactam (ZOSYN) IVPB 3.375 g     3.375 g 12.5 mL/hr over 240 Minutes Intravenous 3 times per day 08/28/13 1126     08/26/13 0800  ertapenem (INVANZ) 1 g in sodium chloride 0.9 % 50 mL IVPB  Status:  Discontinued     1 g 100 mL/hr over 30 Minutes Intravenous Every 24 hours 08/26/13 0747 08/28/13 1126   08/26/13 0200  ciprofloxacin (CIPRO) IVPB 400 mg  Status:  Discontinued     400  mg 200 mL/hr over 60 Minutes Intravenous Every 12 hours 08/25/13 1646 08/26/13 0747   08/25/13 1645  metroNIDAZOLE (FLAGYL) IVPB 500 mg  Status:  Discontinued     500 mg 100 mL/hr over 60 Minutes Intravenous Every 8 hours 08/25/13 1636 08/26/13 0747   08/25/13 1345  ciprofloxacin (CIPRO) IVPB 400 mg     400 mg 200 mL/hr over 60 Minutes Intravenous  Once 08/25/13 1344 08/25/13 1515   08/25/13 1345  metroNIDAZOLE (FLAGYL) IVPB 500 mg     500 mg 100 mL/hr over 60 Minutes Intravenous  Once 08/25/13 1344 08/25/13 1554      Assessment/Plan: s/p * No surgery found *  L TG abscess drain intact Will follow   LOS: 8 days    Aarna Mihalko A 09/02/2013

## 2013-09-02 NOTE — Progress Notes (Signed)
Improving clinically.  Could likely reCT in few days.

## 2013-09-03 DIAGNOSIS — A419 Sepsis, unspecified organism: Secondary | ICD-10-CM

## 2013-09-03 DIAGNOSIS — R6520 Severe sepsis without septic shock: Secondary | ICD-10-CM

## 2013-09-03 LAB — COMPREHENSIVE METABOLIC PANEL
ALT: 39 U/L — ABNORMAL HIGH (ref 0–35)
AST: 30 U/L (ref 0–37)
Albumin: 2.1 g/dL — ABNORMAL LOW (ref 3.5–5.2)
Alkaline Phosphatase: 87 U/L (ref 39–117)
Anion gap: 11 (ref 5–15)
BUN: 13 mg/dL (ref 6–23)
CO2: 24 mEq/L (ref 19–32)
Calcium: 8.5 mg/dL (ref 8.4–10.5)
Chloride: 97 mEq/L (ref 96–112)
Creatinine, Ser: 0.64 mg/dL (ref 0.50–1.10)
GFR calc Af Amer: >90 mL/min (ref >90)
GFR calc non Af Amer: 79 mL/min — ABNORMAL LOW (ref >90)
Glucose, Bld: 198 mg/dL — ABNORMAL HIGH (ref 70–99)
Potassium: 4.3 mEq/L (ref 3.7–5.3)
Sodium: 132 mEq/L — ABNORMAL LOW (ref 137–147)
Total Bilirubin: 0.6 mg/dL (ref 0.3–1.2)
Total Protein: 6.1 g/dL (ref 6.0–8.3)

## 2013-09-03 LAB — GLUCOSE, CAPILLARY
Glucose-Capillary: 159 mg/dL — ABNORMAL HIGH (ref 70–99)
Glucose-Capillary: 160 mg/dL — ABNORMAL HIGH (ref 70–99)
Glucose-Capillary: 213 mg/dL — ABNORMAL HIGH (ref 70–99)
Glucose-Capillary: 171 mg/dL — ABNORMAL HIGH (ref 70–99)

## 2013-09-03 LAB — PROTIME-INR
INR: 1.18 (ref 0.00–1.49)
Prothrombin Time: 15 seconds (ref 11.6–15.2)

## 2013-09-03 LAB — CBC WITH DIFFERENTIAL/PLATELET
Basophils Absolute: 0 10*3/uL (ref 0.0–0.1)
Basophils Relative: 0 % (ref 0–1)
Eosinophils Absolute: 0.3 10*3/uL (ref 0.0–0.7)
Eosinophils Relative: 3 % (ref 0–5)
HCT: 31.8 % — ABNORMAL LOW (ref 36.0–46.0)
Hemoglobin: 10.5 g/dL — ABNORMAL LOW (ref 12.0–15.0)
Lymphocytes Relative: 11 % — ABNORMAL LOW (ref 12–46)
Lymphs Abs: 1.1 10*3/uL (ref 0.7–4.0)
MCH: 33.8 pg (ref 26.0–34.0)
MCHC: 33 g/dL (ref 30.0–36.0)
MCV: 102.3 fL — ABNORMAL HIGH (ref 78.0–100.0)
Monocytes Absolute: 0.7 10*3/uL (ref 0.1–1.0)
Monocytes Relative: 7 % (ref 3–12)
Neutro Abs: 7.9 10*3/uL — ABNORMAL HIGH (ref 1.7–7.7)
Neutrophils Relative %: 79 % — ABNORMAL HIGH (ref 43–77)
Platelets: 371 10*3/uL (ref 150–400)
RBC: 3.11 MIL/uL — ABNORMAL LOW (ref 3.87–5.11)
RDW: 12.7 % (ref 11.5–15.5)
WBC: 10 10*3/uL (ref 4.0–10.5)

## 2013-09-03 LAB — MAGNESIUM: Magnesium: 1.8 mg/dL (ref 1.5–2.5)

## 2013-09-03 LAB — PHOSPHORUS: Phosphorus: 3.1 mg/dL (ref 2.3–4.6)

## 2013-09-03 MED ORDER — MAGNESIUM SULFATE 40 MG/ML IJ SOLN
2.0000 g | Freq: Once | INTRAMUSCULAR | Status: AC
  Administered 2013-09-03: 2 g via INTRAVENOUS
  Filled 2013-09-03: qty 50

## 2013-09-03 MED ORDER — FAT EMULSION 20 % IV EMUL
240.0000 mL | INTRAVENOUS | Status: AC
  Administered 2013-09-03: 240 mL via INTRAVENOUS
  Filled 2013-09-03: qty 250

## 2013-09-03 MED ORDER — CLINIMIX E/DEXTROSE (5/15) 5 % IV SOLN
INTRAVENOUS | Status: AC
  Administered 2013-09-03: 17:00:00 via INTRAVENOUS
  Filled 2013-09-03: qty 2000

## 2013-09-03 NOTE — Progress Notes (Signed)
  Subjective: She is having less pain, she has some feeling with palpation but even this appears to be mild.  Objective: Vital signs in last 24 hours: Temp:  [98.1 F (36.7 C)-99.5 F (37.5 C)] 99.5 F (37.5 C) (08/15 0737) Resp:  [17-26] 20 (08/15 0740) BP: (116-133)/(53-98) 128/66 mmHg (08/15 0740) SpO2:  [97 %-100 %] 98 % (08/15 0740) Last BM Date: 09/02/13 1080 PO +BM last 2 days 10 ml from the drain Full liquid diet, and TNA for nutrition Tm 99.1, telem AF, with rate better controlled./nasal O2 Labs OK WBC dow to 10 3 days of Invanz, 6 days of Zosyn completed Last CT 08/31/13. Intake/Output from previous day: 08/14 0701 - 08/15 0700 In: 3835 [P.O.:1080; I.V.:355; IV Piggyback:350; TPN:2050] Out: 2610 [Urine:2600; Drains:10] Intake/Output this shift: Total I/O In: -  Out: 250 [Urine:250]  General appearance: alert, cooperative and no distress GI: soft, no distension, + BS, +BM.  no pain at rest, some discomfort with palpation, but this is not really acute pain it just feels different.  Lab Results:   Recent Labs  09/02/13 0830 09/03/13 0500  WBC 11.9* 10.0  HGB 10.6* 10.5*  HCT 32.4* 31.8*  PLT 364 371    BMET  Recent Labs  09/02/13 0830 09/03/13 0500  NA 134* 132*  K 3.7 4.3  CL 98 97  CO2 26 24  GLUCOSE 151* 198*  BUN 13 13  CREATININE 0.60 0.64  CALCIUM 8.2* 8.5   PT/INR  Recent Labs  09/02/13 0615 09/03/13 0500  LABPROT 14.8 15.0  INR 1.16 1.18     Recent Labs Lab 08/29/13 0325 08/31/13 0350 09/01/13 0900 09/02/13 0830 09/03/13 0500  AST 23 21 27 30 30   ALT 22 21 25  37* 39*  ALKPHOS 92 84 79 84 87  BILITOT 1.0 0.9 0.7 0.6 0.6  PROT 6.6 6.0 6.4 6.1 6.1  ALBUMIN 2.3* 2.1* 2.2* 2.2* 2.1*     Lipase     Component Value Date/Time   LIPASE 13 08/25/2013 1008     Studies/Results: No results found.  Medications: . busPIRone  7.5 mg Oral BID  . enoxaparin (LOVENOX) injection  85 mg Subcutaneous Q12H  . insulin aspart   0-9 Units Subcutaneous 4 times per day  . levothyroxine  12.5 mcg Intravenous Daily  . metoprolol  5 mg Intravenous 4 times per day  . piperacillin-tazobactam (ZOSYN)  IV  3.375 g Intravenous 3 times per day  . temazepam  30 mg Oral QHS    Assessment/Plan Diverticulitis with multiple abscesses/Sepsis  Antibiotics:  Day 1 cipro/flagyl Day 2-4 Invanz Day 4-9 Zosyn today will be day 10 and on Zosyn  S/p IR drain 08/26/13  Chronic Afib with RVR  Hx of DCHF  Hypertension  Hypothyroid  UTI  Hx of anemia  Fx with open left tibial fx 06/2013  Body mass index is 35.23 kg/(m^2).   Plan:  She started Full liquids last PM, continue this, continue TNA, continue Zosyn, repeat CT early next week.   LOS: 9 days    [name] 09/03/2013

## 2013-09-03 NOTE — Progress Notes (Signed)
[facility] TEAM 1 - Stepdown/ICU TEAM Progress Note  [name] HQI:[id number] DOB: 1925-02-10 DOA: 08/25/2013 PCP: Walker Kehr, MD  Admit HPI / Brief Narrative: 80 y.o. WF PMHx atrial fibrillation on chronic anticoagulation and recent fall which led to an open fracture of the lower extremity requiring surgical intervention and wound care with wound VAC. Patient was in skilled nursing for rehabilitation and started having L lower quadrant abdominal pain.  CT scan of the abdomen was noted for inflammatory changes in the pelvis and right lower quadrant with wall thickening of small bowel adjacent loops concerning for infectious ileitis and findings consistent with a possible ruptured diverticulitis and abscess. General Surgery was called for consultation. Lab work noted white blood cell count 21, anion gap of 16 with lactic acid level of 3.76. Patient was started on IV Cipro and Flagyl.    HPI/Subjective: 8/15 sitting comfortably in chair, A./O. x4, states no pain, negative CP, negative SOB, negative CVA tenderness   Assessment/Plan: Diverticulitis of colon with perforation  -As per Gen Surg  - s/p L transgluteal percutaneous drainage in IR 8/7  - diet per Gen Surg, anticipate very slow advancement  - Abscess cultures positive enterococcus which is pan sensitive as well as pan sensitive E coli  - Ertapenem was changed to zosyn previously as pharmacy suggested ertapenem provides poor enterococcus coverage  - cont TNA  -CT scan 8/11 shows area which current catheter may not be draining (see CT report below); treatment per surgery -Plan is for patient to have followup CT on Monday or Tuesday to determine if abscess has Foley drained.  Sepsis  -WBCs WNL -NSR  Metabolic acidosis  -resolved w/ volume resuscitation   Chronic afib w/ acute RVR  -RVR resolved  - Continue Cardizem drip; will await surgeries okay to transition to PO  -Continue Lovenox  BID    Chronic diastolic CHF  -EF  13% on echogram in 2012 - currently euvolemic   HTN  -BP within AHA guidelines; when patient becomes more ambulatory may have to back off of her BP medication   UTI  -Current abx should provide adequate coverage - appears that urine was not sent for cx   Hypothyroid  -Continue Synthroid 12.5 mcg daily    Hx of Fe deficient anemia  -Fe studies most c/w "anemia of chronic disease" i.e. likely a result of inflammatory state and recent poor nutrition   Macrocytosis  -B12 normal/high June 2014 - recheck notes adequate stores (658)   Mechanical fall with left open tibial fracture June 2015  Cont current prescribed wound care (followed by Dr. Migdalia Dk)   Hypokalemia -Potassium goal > 4   Hypomagnesemia -Magnesium goal > 2      Code Status:DNR Family Communication: family present at time of exam Disposition Plan: Per surgery   Consultants: Dr. Rolm Bookbinder (surgery)    Procedure/Significant Events: 8/7 - transgluteal percutaneous drainage in IR  8/7 - PICC line placement  8/8 -continue TNA  8/11 CT abdomen pelvis with contrast -Abscess in lower left pelvis is smaller with drain in place. Most of remaining fluid and air is slightly posterior/lateral to the drainage catheter tip. Unsure remaining fluid connects to the area that has been decompressed.  -Evidence of generalized ileus. - moderate hiatal hernia.    Culture 8/6 MRSA by PCR negative 8/7 left buttocks positive Escherichia coli and Enterococcus; pan sensitive   Antibiotics: Cipro 8/06 >> stopped 8/7 Flagyl 8/06 >> stopped 8/7 Ertapenem 8/07 >> stopped 8/9  Zosyn 8/9 >>   DVT prophylaxis: IV heparin > lovenox    Devices 8/7 The superior medial buttocks JP drain   LINES / TUBES:  8/7 double-lumen midline PICC 8/10 20 ga left forearm   Continuous Infusions: . sodium chloride 10 mL/hr at 09/03/13 1100  . diltiazem (CARDIZEM) infusion 5 mg/hr (09/03/13 1100)  . Marland KitchenTPN (CLINIMIX-E) Adult 70 mL/hr at  09/03/13 1100   And  . fat emulsion 240 mL (09/03/13 1100)  . Marland KitchenTPN (CLINIMIX-E) Adult     And  . fat emulsion      Objective: VITAL SIGNS: Temp: 98.5 F (36.9 C) (08/15 1127) Temp src: Oral (08/15 1127) BP: 128/57 mmHg (08/15 1127) Pulse Rate: 74 (08/15 1127) SPO2; 100% on room air FIO2:   Intake/Output Summary (Last 24 hours) at 09/03/13 1420 Last data filed at 09/03/13 1101  Gross per 24 hour  Intake   2559 ml  Output   2010 ml  Net    549 ml     Exam: General: A./O. x4, NAD, No acute respiratory distress, sitting in chair comfortably Lungs: Clear to auscultation bilaterally without wheezes or crackles Cardiovascular: Regular rhythm and rate, without murmur gallop or rub normal S1 and S2 Abdomen: Nontender, nondistended, hypoactive bowel sounds, no rebound, no ascites, no appreciable mass, left JP drain serous fluid Extremities: No significant cyanosis, clubbing, or edema bilateral lower extremities  Data Reviewed: Basic Metabolic Panel:  Recent Labs Lab 08/28/13 0310 08/29/13 0325 08/30/13 0432 08/30/13 1830 08/31/13 0350 09/01/13 0500 09/01/13 0900 09/02/13 0830 09/03/13 0500  NA 136* 138 136*  --  136*  --  133* 134* 132*  K 4.3 3.5* 3.2* 3.9 3.4*  --  3.9 3.7 4.3  CL 102 100 95*  --  96  --  96 98 97  CO2 21 24 29   --  27  --  28 26 24   GLUCOSE 127* 148* 159*  --  118*  --  152* 151* 198*  BUN 13 10 9   --  12  --  13 13 13   CREATININE 0.77 0.68 0.56  --  0.66  --  0.56 0.60 0.64  CALCIUM 8.4 8.3* 8.3*  --  8.1*  --  8.3* 8.2* 8.5  MG 2.1 1.7 1.6 2.0 1.9  --  1.7 1.7 1.8  PHOS 2.8 2.6  --   --   --  2.8  --   --  3.1   Liver Function Tests:  Recent Labs Lab 08/29/13 0325 08/31/13 0350 09/01/13 0900 09/02/13 0830 09/03/13 0500  AST 23 21 27 30 30   ALT 22 21 25  37* 39*  ALKPHOS 92 84 79 84 87  BILITOT 1.0 0.9 0.7 0.6 0.6  PROT 6.6 6.0 6.4 6.1 6.1  ALBUMIN 2.3* 2.1* 2.2* 2.2* 2.1*   No results found for this basename: LIPASE, AMYLASE,  in  the last 168 hours No results found for this basename: AMMONIA,  in the last 168 hours CBC:  Recent Labs Lab 08/29/13 0325 08/30/13 0432 08/31/13 0350 09/01/13 0500 09/02/13 0830 09/03/13 0500  WBC 14.9* 12.7* 12.0* 11.8* 11.9* 10.0  NEUTROABS 12.8*  --  9.4* 8.8* 9.8* 7.9*  HGB 11.3* 12.9 11.4* 10.9* 10.6* 10.5*  HCT 33.7* 38.6 33.8* 31.5* 32.4* 31.8*  MCV 106.6* 102.9* 107.3* 104.7* 102.5* 102.3*  PLT 322 338 327 352 364 371   Cardiac Enzymes: No results found for this basename: CKTOTAL, CKMB, CKMBINDEX, TROPONINI,  in the last 168 hours BNP (last 3 results)  No results found for this basename: PROBNP,  in the last 8760 hours CBG:  Recent Labs Lab 09/02/13 1125 09/02/13 1603 09/02/13 2350 09/03/13 0533 09/03/13 1127  GLUCAP 159* 176* 143* 159* 160*    Recent Results (from the past 240 hour(s))  CULTURE, BLOOD (ROUTINE X 2)     Status: None   Collection Time    08/25/13  3:05 PM      Result Value Ref Range Status   Specimen Description BLOOD LEFT FOREARM   Final   Special Requests BOTTLES DRAWN AEROBIC ONLY 5CC   Final   Culture  Setup Time     Final   Value: 08/25/2013 18:56     Performed at [facility]   Culture     Final   Value: NO GROWTH 5 DAYS     Performed at [facility]   Report Status 08/31/2013 FINAL   Final  CULTURE, BLOOD (ROUTINE X 2)     Status: None   Collection Time    08/25/13  3:45 PM      Result Value Ref Range Status   Specimen Description BLOOD LEFT FOREARM   Final   Special Requests BOTTLES DRAWN AEROBIC ONLY 5CC   Final   Culture  Setup Time     Final   Value: 08/25/2013 20:08     Performed at [facility]   Culture     Final   Value: NO GROWTH 5 DAYS     Performed at [facility]   Report Status 08/31/2013 FINAL   Final  MRSA PCR SCREENING     Status: None   Collection Time    08/25/13  6:02 PM      Result Value Ref Range Status   MRSA by PCR NEGATIVE  NEGATIVE Final   Comment:            The  GeneXpert MRSA Assay (FDA     approved for NASAL specimens     only), is one component of a     comprehensive MRSA colonization     surveillance program. It is not     intended to diagnose MRSA     infection nor to guide or     monitor treatment for     MRSA infections.  SURGICAL PCR SCREEN     Status: None   Collection Time    08/26/13 10:27 AM      Result Value Ref Range Status   MRSA, PCR NEGATIVE  NEGATIVE Final   Staphylococcus aureus NEGATIVE  NEGATIVE Final   Comment:            The Xpert SA Assay (FDA     approved for NASAL specimens     in patients over 74 years of age),     is one component of     a comprehensive surveillance     program.  Test performance has     been validated by [employer] for patients greater     than or equal to 38 year old.     It is not intended     to diagnose infection nor to     guide or monitor treatment.  CULTURE, ROUTINE-ABSCESS     Status: None   Collection Time    08/26/13  2:04 PM      Result Value Ref Range Status   Specimen Description BUTTOCKS LEFT   Final   Special Requests LEFT UPPER  BUTTOCK JP DRAIN   Final   Gram Stain     Final   Value: ABUNDANT WBC PRESENT,BOTH PMN AND MONONUCLEAR     NO SQUAMOUS EPITHELIAL CELLS SEEN     ABUNDANT GRAM POSITIVE RODS     ABUNDANT GRAM NEGATIVE RODS     FEW GRAM POSITIVE COCCI   Culture     Final   Value: MODERATE ESCHERICHIA COLI     MODERATE ENTEROCOCCUS SPECIES     Performed at [facility]   Report Status 08/29/2013 FINAL   Final   Organism ID, Bacteria ESCHERICHIA COLI   Final   Organism ID, Bacteria ENTEROCOCCUS SPECIES   Final  ANAEROBIC CULTURE     Status: None   Collection Time    08/26/13  2:04 PM      Result Value Ref Range Status   Specimen Description ABSCESS BUTTOCKS LEFT   Final   Special Requests LEFT UPPER BUTTOCK JP DRAIN   Final   Gram Stain     Final   Value: ABUNDANT WBC PRESENT,BOTH PMN AND MONONUCLEAR     NO SQUAMOUS EPITHELIAL CELLS SEEN      ABUNDANT GRAM POSITIVE RODS     ABUNDANT GRAM NEGATIVE RODS     FEW GRAM POSITIVE COCCI   Culture     Final   Value: ABUNDANT BACTEROIDES THETAIOTAOMICRON     Note: BETA LACTAMASE POSITIVE     Performed at [facility]   Report Status 09/02/2013 FINAL   Final  CLOSTRIDIUM DIFFICILE BY PCR     Status: None   Collection Time    08/30/13  6:09 AM      Result Value Ref Range Status   C difficile by pcr NEGATIVE  NEGATIVE Final     Studies:  Recent x-ray studies have been reviewed in detail by the Attending Physician  Scheduled Meds:  Scheduled Meds: . busPIRone  7.5 mg Oral BID  . enoxaparin (LOVENOX) injection  85 mg Subcutaneous Q12H  . insulin aspart  0-9 Units Subcutaneous 4 times per day  . levothyroxine  12.5 mcg Intravenous Daily  . metoprolol  5 mg Intravenous 4 times per day  . piperacillin-tazobactam (ZOSYN)  IV  3.375 g Intravenous 3 times per day  . temazepam  30 mg Oral QHS    Time spent on care of this patient: 40 mins   Allie Bossier , MD   [facility] Office  [phone] Pager [phone]  On-Call/Text Page:      [email or web address]      password [email or web address]  If 7PM-7AM, please contact night-coverage [email or web address] Password [email or web address] 09/03/2013, 2:20 PM   LOS: 9 days

## 2013-09-03 NOTE — Progress Notes (Signed)
Feeling much better with drain in place WBC normal Repeat CT scan early next week.  Imogene Burn. Georgette Dover, MD, [facility]  General/ Trauma Surgery  09/03/2013 1:51 PM

## 2013-09-03 NOTE — Progress Notes (Signed)
PARENTERAL NUTRITION CONSULT NOTE - FOLLOW UP  Pharmacy Consult for TPN Indication: Diverticulitis s/p drain, inability to advance diet  Allergies  Allergen Reactions  . Atenolol Nausea And Vomiting  . Clarithromycin Nausea And Vomiting  . Codeine Sulfate Nausea Only  . Macrodantin Other (See Comments)    Long time ago - reaction unknown  . Percocet [Oxycodone-Acetaminophen] Nausea And Vomiting    Patient Measurements: Height: 5\' 3"  (160 cm) Weight: 183 lb 6.8 oz (83.2 kg) IBW/kg (Calculated) : 52.4 Adjusted Body Weight: 62.2 kg  Vital Signs: Temp: 99.5 F (37.5 C) (08/15 0737) Temp src: Oral (08/15 0737) BP: 128/66 mmHg (08/15 0740) Intake/Output from previous day: 08/14 0701 - 08/15 0700 In: 3835 [P.O.:1080; I.V.:355; IV Piggyback:350; TPN:2050] Out: 2610 [Urine:2600; Drains:10] Intake/Output from this shift: Total I/O In: -  Out: 250 [Urine:250]  Labs:  Recent Labs  09/01/13 0500 09/02/13 0615 09/02/13 0830 09/03/13 0500  WBC 11.8*  --  11.9* 10.0  HGB 10.9*  --  10.6* 10.5*  HCT 31.5*  --  32.4* 31.8*  PLT 352  --  364 371  INR 1.18 1.16  --  1.18     Recent Labs  09/01/13 0500 09/01/13 0900 09/02/13 0830 09/03/13 0500  NA  --  133* 134* 132*  K  --  3.9 3.7 4.3  CL  --  96 98 97  CO2  --  28 26 24   GLUCOSE  --  152* 151* 198*  BUN  --  13 13 13   CREATININE  --  0.56 0.60 0.64  CALCIUM  --  8.3* 8.2* 8.5  MG  --  1.7 1.7 1.8  PHOS 2.8  --   --  3.1  PROT  --  6.4 6.1 6.1  ALBUMIN  --  2.2* 2.2* 2.1*  AST  --  27 30 30   ALT  --  25 37* 39*  ALKPHOS  --  79 84 87  BILITOT  --  0.7 0.6 0.6   Estimated Creatinine Clearance: 52.5 ml/min (by C-G formula based on Cr of 0.64).    Recent Labs  09/02/13 1603 09/02/13 2350 09/03/13 0533  GLUCAP 176* 143* 159*    Insulin Requirements in the past 24 hours:  7 units SSI  Current Nutrition:  Clinimix E 5/15 at 64ml/hr + 20% lipid emulsion at 10 ml/hr- providing 84g protein and 1673kCal  daily. Rate was lowered 8/14 in attempt to help with appetite  Full liquid diet- has eaten ~50% of meals  Nutritional Goals:  1750-1950 kCal, 110-125 grams of protein per day  Admit: Diverticulitis with abscess s/p drainage per IR  GI: Diverticulitis with abscess, drain placed 8/7. Tolerating full liquid without n/v after episode of vomiting while on ice chips. Patient told me 8/14 she did not feel that she had much of an appetite and was forcing herself to eat but she was not nauseous. Her TPN rate was turned down in order to help with appetite stimulation. Last BM 8/14. Prealbumin 8 (low) on 8/10  Endo: Synthoid IV, last TSH from June was normal. CBGs 143-198  Lytes: K 4.3, mag 1.8, Phos 3.1, CorCa 10  Renal: IV fluids- NS at [facility]. UOP 1.3, SCr 0.64- stable  Pulm: 98/RA  Cards: Afib, HTN, dHF - Coumadin on hold and currently anticoagulated with Lovenox 85mg /12h. VSS on diltiazem IV, Toprol XL  Ortho: recent fall 6/15 which led to an open fracture of the lower extremity requiring surgical intervention and wound care with wound  VAC.   Hepatobil: LFTs WNL except for low albumin. Trigs 73 on 8/10  Neuro/MSK: Restoril, Buspar  ID:  [id number] for diverticulitis + abscess with Enterococcus/Ecoli in culture from JP drain on Zosyn. BC from 8/6 negative  Best Practices: Lovenox  TPN Access: PICC line (8/7) TPN day#: 7 (start date 8/9)  Plan:  1. Will replace magnesium with magnesium sulfate 2g IV x1 for goal >2 2. Continue Clinimix E 5/15 at 50ml/hr + lipid 20% emulsion at 10 ml/hr- this provides 84g protein and 1673kCal daily. Hopeful reduction in rate will stimulate appetite 3. Continue trace elements and daily multivitamin in TPN 4. Continue CBGs and SSI as ordered 5. Continue IVF as ordered 6. CMET, magnesium and phosphorus tomorrow morning 7. Follow up tolerance of diet progression, appetite stimulation  Ashlea Dusing D. Bilbo Carcamo, PharmD, BCPS Clinical Pharmacist Pager:  [phone] 09/03/2013 8:01 AM

## 2013-09-03 NOTE — Plan of Care (Signed)
Problem: Phase I Progression Outcomes Goal: Hemodynamically stable Outcome: Not Progressing Continue cardizem drip at 5 mg/hr to maintain HR 65-105. HR remains within parameters. BP stable

## 2013-09-04 LAB — CBC WITH DIFFERENTIAL/PLATELET
Basophils Absolute: 0 10*3/uL (ref 0.0–0.1)
Basophils Relative: 0 % (ref 0–1)
Eosinophils Absolute: 0.3 10*3/uL (ref 0.0–0.7)
Eosinophils Relative: 3 % (ref 0–5)
HCT: 31.3 % — ABNORMAL LOW (ref 36.0–46.0)
Hemoglobin: 10.4 g/dL — ABNORMAL LOW (ref 12.0–15.0)
Lymphocytes Relative: 13 % (ref 12–46)
Lymphs Abs: 1 10*3/uL (ref 0.7–4.0)
MCH: 33.9 pg (ref 26.0–34.0)
MCHC: 33.2 g/dL (ref 30.0–36.0)
MCV: 102 fL — ABNORMAL HIGH (ref 78.0–100.0)
Monocytes Absolute: 0.7 10*3/uL (ref 0.1–1.0)
Monocytes Relative: 9 % (ref 3–12)
Neutro Abs: 5.9 10*3/uL (ref 1.7–7.7)
Neutrophils Relative %: 75 % (ref 43–77)
Platelets: 364 10*3/uL (ref 150–400)
RBC: 3.07 MIL/uL — ABNORMAL LOW (ref 3.87–5.11)
RDW: 12.6 % (ref 11.5–15.5)
WBC: 7.9 10*3/uL (ref 4.0–10.5)

## 2013-09-04 LAB — MAGNESIUM: Magnesium: 1.9 mg/dL (ref 1.5–2.5)

## 2013-09-04 LAB — COMPREHENSIVE METABOLIC PANEL
ALT: 42 U/L — ABNORMAL HIGH (ref 0–35)
AST: 29 U/L (ref 0–37)
Albumin: 2.2 g/dL — ABNORMAL LOW (ref 3.5–5.2)
Alkaline Phosphatase: 86 U/L (ref 39–117)
Anion gap: 11 (ref 5–15)
BUN: 14 mg/dL (ref 6–23)
CO2: 24 mEq/L (ref 19–32)
Calcium: 8.5 mg/dL (ref 8.4–10.5)
Chloride: 97 mEq/L (ref 96–112)
Creatinine, Ser: 0.69 mg/dL (ref 0.50–1.10)
GFR calc Af Amer: 89 mL/min — ABNORMAL LOW (ref >90)
GFR calc non Af Amer: 77 mL/min — ABNORMAL LOW (ref >90)
Glucose, Bld: 160 mg/dL — ABNORMAL HIGH (ref 70–99)
Potassium: 3.9 mEq/L (ref 3.7–5.3)
Sodium: 132 mEq/L — ABNORMAL LOW (ref 137–147)
Total Bilirubin: 0.5 mg/dL (ref 0.3–1.2)
Total Protein: 6.3 g/dL (ref 6.0–8.3)

## 2013-09-04 LAB — PROTIME-INR
INR: 1.21 (ref 0.00–1.49)
Prothrombin Time: 15.3 seconds — ABNORMAL HIGH (ref 11.6–15.2)

## 2013-09-04 LAB — GLUCOSE, CAPILLARY
Glucose-Capillary: 141 mg/dL — ABNORMAL HIGH (ref 70–99)
Glucose-Capillary: 196 mg/dL — ABNORMAL HIGH (ref 70–99)
Glucose-Capillary: 143 mg/dL — ABNORMAL HIGH (ref 70–99)
Glucose-Capillary: 144 mg/dL — ABNORMAL HIGH (ref 70–99)
Glucose-Capillary: 175 mg/dL — ABNORMAL HIGH (ref 70–99)

## 2013-09-04 LAB — PHOSPHORUS: Phosphorus: 2.7 mg/dL (ref 2.3–4.6)

## 2013-09-04 MED ORDER — TRACE MINERALS CR-CU-F-FE-I-MN-MO-SE-ZN IV SOLN
INTRAVENOUS | Status: AC
  Administered 2013-09-04: 18:00:00 via INTRAVENOUS
  Filled 2013-09-04: qty 2000

## 2013-09-04 MED ORDER — INSULIN ASPART 100 UNIT/ML ~~LOC~~ SOLN: 0.0000 [IU] | SUBCUTANEOUS | Status: DC

## 2013-09-04 MED ORDER — INSULIN ASPART 100 UNIT/ML ~~LOC~~ SOLN
0.0000 [IU] | Freq: Three times a day (TID) | SUBCUTANEOUS | Status: DC
  Administered 2013-09-04 – 2013-09-06 (×5): 2 [IU] via SUBCUTANEOUS
  Administered 2013-09-06 (×3): 1 [IU] via SUBCUTANEOUS
  Administered 2013-09-07: 2 [IU] via SUBCUTANEOUS
  Administered 2013-09-07: 1 [IU] via SUBCUTANEOUS

## 2013-09-04 MED ORDER — FAT EMULSION 20 % IV EMUL
240.0000 mL | INTRAVENOUS | Status: AC
  Administered 2013-09-04: 240 mL via INTRAVENOUS
  Filled 2013-09-04: qty 250

## 2013-09-04 MED ORDER — DILTIAZEM HCL 30 MG PO TABS
30.0000 mg | ORAL_TABLET | Freq: Two times a day (BID) | ORAL | Status: DC
  Administered 2013-09-04 – 2013-09-08 (×8): 30 mg via ORAL
  Filled 2013-09-04 (×11): qty 1

## 2013-09-04 NOTE — Progress Notes (Signed)
  Subjective: Pt feeling about the same.  tol PO  Objective: Vital signs in last 24 hours: Temp:  [98.4 F (36.9 C)-99.5 F (37.5 C)] 99 F (37.2 C) (08/16 0811) Pulse Rate:  [74-108] 87 (08/16 0811) Resp:  [15-24] 20 (08/16 0811) BP: (91-152)/(41-77) 120/41 mmHg (08/16 0811) SpO2:  [95 %-100 %] 97 % (08/16 0811) Last BM Date: 09/02/13  Intake/Output from previous day: 08/15 0701 - 08/16 0700 In: 3276.8 [P.O.:1080; I.V.:246.8; IV Piggyback:210; LAG:5364] Out: 6803 [Urine:3075] Intake/Output this shift: Total I/O In: 270.4 [I.V.:30.4; TPN:240] Out: 400 [Urine:400]  General appearance: alert and cooperative GI: soft, approp, ND, TTP to deep palp.  JP Drain SS  Lab Results:   Recent Labs  09/03/13 0500 09/04/13 0238  WBC 10.0 7.9  HGB 10.5* 10.4*  HCT 31.8* 31.3*  PLT 371 364   BMET  Recent Labs  09/03/13 0500 09/04/13 0238  NA 132* 132*  K 4.3 3.9  CL 97 97  CO2 24 24  GLUCOSE 198* 160*  BUN 13 14  CREATININE 0.64 0.69  CALCIUM 8.5 8.5   PT/INR  Recent Labs  09/03/13 0500 09/04/13 0238  LABPROT 15.0 15.3*  INR 1.18 1.21   ABG No results found for this basename: PHART, PCO2, PO2, HCO3,  in the last 72 hours  Studies/Results: No results found.  Anti-infectives: Anti-infectives   Start     Dose/Rate Route Frequency Ordered Stop   08/28/13 1400  piperacillin-tazobactam (ZOSYN) IVPB 3.375 g     3.375 g 12.5 mL/hr over 240 Minutes Intravenous 3 times per day 08/28/13 1126     08/26/13 0800  ertapenem (INVANZ) 1 g in sodium chloride 0.9 % 50 mL IVPB  Status:  Discontinued     1 g 100 mL/hr over 30 Minutes Intravenous Every 24 hours 08/26/13 0747 08/28/13 1126   08/26/13 0200  ciprofloxacin (CIPRO) IVPB 400 mg  Status:  Discontinued     400 mg 200 mL/hr over 60 Minutes Intravenous Every 12 hours 08/25/13 1646 08/26/13 0747   08/25/13 1645  metroNIDAZOLE (FLAGYL) IVPB 500 mg  Status:  Discontinued     500 mg 100 mL/hr over 60 Minutes  Intravenous Every 8 hours 08/25/13 1636 08/26/13 0747   08/25/13 1345  ciprofloxacin (CIPRO) IVPB 400 mg     400 mg 200 mL/hr over 60 Minutes Intravenous  Once 08/25/13 1344 08/25/13 1515   08/25/13 1345  metroNIDAZOLE (FLAGYL) IVPB 500 mg     500 mg 100 mL/hr over 60 Minutes Intravenous  Once 08/25/13 1344 08/25/13 1554      Assessment/Plan:  Diverticulitis with multiple abscesses/Sepsis  Antibiotics: Day 2 cipro/flagyl Day 3-4 Invanz Day 5-9 Zosyn today will be day 11 and on Zosyn  S/p IR drain 08/26/13  Chronic Afib with RVR  Hx of DCHF  Hypertension  Hypothyroid  UTI  Hx of anemia  Fx with open left tibial fx 06/2013  Body mass index is 35.23 kg/(m^2).   Plan: Con't FLD for now, continue TNA, continue Zosyn, repeat CT early next week.  LOS: 9 days     LOS: 10 days    Rosario Jacks., [facility] 09/04/2013

## 2013-09-04 NOTE — Progress Notes (Signed)
PARENTERAL NUTRITION CONSULT NOTE - FOLLOW UP  Pharmacy Consult for TPN Indication: Diverticulitis s/p drain, inability to advance diet  Allergies  Allergen Reactions  . Atenolol Nausea And Vomiting  . Clarithromycin Nausea And Vomiting  . Codeine Sulfate Nausea Only  . Macrodantin Other (See Comments)    Long time ago - reaction unknown  . Percocet [Oxycodone-Acetaminophen] Nausea And Vomiting    Patient Measurements: Height: 5\' 3"  (160 cm) Weight: 183 lb 6.8 oz (83.2 kg) IBW/kg (Calculated) : 52.4 Adjusted Body Weight: 62.2 kg  Vital Signs: Temp: 99 F (37.2 C) (08/16 0811) Temp src: Oral (08/16 0811) BP: 120/41 mmHg (08/16 0811) Pulse Rate: 87 (08/16 0811) Intake/Output from previous day: 08/15 0701 - 08/16 0700 In: 3276.8 [P.O.:1080; I.V.:246.8; IV Piggyback:210; XBJ:4782] Out: 3075 [Urine:3075] Intake/Output from this shift: Total I/O In: 270.4 [I.V.:30.4; TPN:240] Out: 400 [Urine:400]  Labs:  Recent Labs  09/02/13 0615 09/02/13 0830 09/03/13 0500 09/04/13 0238  WBC  --  11.9* 10.0 7.9  HGB  --  10.6* 10.5* 10.4*  HCT  --  32.4* 31.8* 31.3*  PLT  --  364 371 364  INR 1.16  --  1.18 1.21     Recent Labs  09/02/13 0830 09/03/13 0500 09/04/13 0238  NA 134* 132* 132*  K 3.7 4.3 3.9  CL 98 97 97  CO2 26 24 24   GLUCOSE 151* 198* 160*  BUN 13 13 14   CREATININE 0.60 0.64 0.69  CALCIUM 8.2* 8.5 8.5  MG 1.7 1.8 1.9  PHOS  --  3.1 2.7  PROT 6.1 6.1 6.3  ALBUMIN 2.2* 2.1* 2.2*  AST 30 30 29   ALT 37* 39* 42*  ALKPHOS 84 87 86  BILITOT 0.6 0.6 0.5   Estimated Creatinine Clearance: 52.5 ml/min (by C-G formula based on Cr of 0.69).    Recent Labs  09/03/13 2118 09/03/13 2342 09/04/13 0541  GLUCAP 171* 141* 196*    Insulin Requirements in the past 24 hours:  8 units SSI  Current Nutrition:  Clinimix E 5/15 at 2ml/hr + 20% lipid emulsion at 10 ml/hr- providing 84g protein and 1673kCal daily. Rate was lowered 8/14 in attempt to help with  appetite  Full liquid diet- has eaten ~10-50% of meals  Nutritional Goals:  1750-1950 kCal, 110-125 grams of protein per day  Admit: Diverticulitis with abscess s/p drainage per IR  GI: Diverticulitis with abscess, drain placed 8/7. Tolerating full liquid without n/v after episode of vomiting while on ice chips. Tolerating diet, still not with a lot of intake. Her TPN rate was turned down in order to help with appetite stimulation. Last BM 8/14. Prealbumin 8 (low) on 8/10  Endo: Synthoid IV, last TSH from June was normal. CBGs 141-196  Lytes: K 3.9, mag 1.9, Phos 2.7, CorCa 10. Na low, stable   Renal: IV fluids- NS at [facility]. UOP 1.5, SCr 0.69- stable  Pulm: 97/RA  Cards: Afib, HTN, dHF - Coumadin on hold and currently anticoagulated with Lovenox 85mg /12h. VSS on diltiazem IV, Toprol XL  Ortho: recent fall 6/15 which led to an open fracture of the lower extremity requiring surgical intervention and wound care with wound VAC.   Hepatobil: LFTs WNL except for low albumin. Trigs 73 on 8/10  Neuro/MSK: Restoril, Buspar  ID:  ABX [id number] for diverticulitis + abscess with Enterococcus/Ecoli in culture from JP drain on Zosyn. BC from 8/6 negative  Best Practices: Lovenox  TPN Access: PICC line (8/7) TPN day#: 8 (start date 8/9)  Plan:  1. Reduce Clinimix E 5/15 to 38ml/hr + lipid 20% emulsion at 10 ml/hr- this provides 72g protein and 1502Cal daily. Attempting to stimulate appetite by reducing amount of nutrition given via TPN 2. Continue trace elements and daily multivitamin in TPN 3. Continue CBGs and SSI as ordered 4. Continue IVF as ordered 5. TPN labs as ordered 6. Follow up tolerance of diet progression, appetite stimulation, ability to stop TPN  Shaquasha Gerstel D. Eyleen Rawlinson, PharmD, BCPS Clinical Pharmacist Pager: [phone] 09/04/2013 10:04 AM

## 2013-09-04 NOTE — Progress Notes (Signed)
Feeling better (L) TG abscess drain intact, scant output past few days. For repeat CT early this week.  Ascencion Dike PA-C Interventional Radiology 09/04/2013 9:15 AM

## 2013-09-04 NOTE — Progress Notes (Signed)
Increased cardizem drip to 10mg /hr

## 2013-09-04 NOTE — Progress Notes (Signed)
ANTICOAGULATION CONSULT NOTE - Follow Up Consult  Pharmacy Consult for Lovenox Indication: atrial fibrillation (Coumadin on hold)  Allergies  Allergen Reactions  . Atenolol Nausea And Vomiting  . Clarithromycin Nausea And Vomiting  . Codeine Sulfate Nausea Only  . Macrodantin Other (See Comments)    Long time ago - reaction unknown  . Percocet [Oxycodone-Acetaminophen] Nausea And Vomiting    Patient Measurements: Height: 5\' 3"  (160 cm) Weight: 183 lb 6.8 oz (83.2 kg) IBW/kg (Calculated) : 52.4 Heparin Dosing Weight: 83.2 kg  Vital Signs: Temp: 99.1 F (37.3 C) (08/16 0331) Temp src: Oral (08/16 0331) BP: 116/42 mmHg (08/16 0647) Pulse Rate: 108 (08/16 0339)  Labs:  Recent Labs  09/02/13 0615  09/02/13 0830 09/03/13 0500 09/04/13 0238  HGB  --   < > 10.6* 10.5* 10.4*  HCT  --   --  32.4* 31.8* 31.3*  PLT  --   --  364 371 364  LABPROT 14.8  --   --  15.0 15.3*  INR 1.16  --   --  1.18 1.21  CREATININE  --   --  0.60 0.64 0.69  < > = values in this interval not displayed.  Estimated Creatinine Clearance: 52.5 ml/min (by C-G formula based on Cr of 0.69).   Medications:  Scheduled:  . busPIRone  7.5 mg Oral BID  . enoxaparin (LOVENOX) injection  85 mg Subcutaneous Q12H  . insulin aspart  0-9 Units Subcutaneous 4 times per day  . levothyroxine  12.5 mcg Intravenous Daily  . metoprolol  5 mg Intravenous 4 times per day  . piperacillin-tazobactam (ZOSYN)  IV  3.375 g Intravenous 3 times per day  . temazepam  30 mg Oral QHS    Assessment: [name] on Lovenox for afib while chronic Coumadin is on hold.  Lovenox 85 mg sq q12 hrs is appropriate for her renal function and CBC is stable.  Awaiting decision on any surgical intervention.  Goal of Therapy:  Anti-Xa level 0.6-1 units/ml 4hrs after LMWH dose given Monitor platelets by anticoagulation protocol: Yes   Plan:  1. Continue Lovenox 85 mg sq q 12 hrs. 2. F/u plans for transition back to oral  anticoagulation as able.  Uvaldo Rising, BCPS  Clinical Pharmacist Pager [phone]  09/04/2013 8:10 AM

## 2013-09-04 NOTE — Progress Notes (Signed)
[place] TEAM 1 - Stepdown/ICU TEAM Progress Note  [name] HQI:[id number] DOB: 05/27/1925 DOA: 08/25/2013 PCP: Walker Kehr, MD  Admit HPI / Brief Narrative: 80 y.o. WF PMHx atrial fibrillation on chronic anticoagulation and recent fall which led to an open fracture of the lower extremity requiring surgical intervention and wound care with wound VAC. Patient was in skilled nursing for rehabilitation and started having L lower quadrant abdominal pain.  CT scan of the abdomen was noted for inflammatory changes in the pelvis and right lower quadrant with wall thickening of small bowel adjacent loops concerning for infectious ileitis and findings consistent with a possible ruptured diverticulitis and abscess. General Surgery was called for consultation. Lab work noted white blood cell count 21, anion gap of 16 with lactic acid level of 3.76. Patient was started on IV Cipro and Flagyl.    HPI/Subjective: 8/16 sitting comfortably in bed,  A./O. x4, states no pain, negative CP, negative SOB, negative CVA. Tenderness. Daughter requests that in the a.m. we contact [facility] Dr. Marcene Duos; patient has missed 2 followup appointments for her left lower extremity injury (see details in procedure/significant events)   Assessment/Plan: Diverticulitis of colon with perforation  -As per Gen Surg  - s/p L transgluteal percutaneous drainage in IR 8/7  - diet per Gen Surg, anticipate very slow advancement  - Abscess cultures positive enterococcus which is pan sensitive as well as pan sensitive E coli  - Ertapenem was changed to zosyn previously as pharmacy suggested ertapenem provides poor enterococcus coverage  - cont TNA  -CT scan 8/11 shows area which current catheter may not be draining (see CT report below); treatment per surgery -Plan is for patient to have followup CT on Monday or Tuesday to determine if abscess has fully drained.  Sepsis  -WBCs WNL -NSR  Metabolic acidosis    -resolved w/ volume resuscitation   Chronic afib w/ acute RVR  -RVR resolved  - DC Cardizem drip -Start diltiazem 30BID -Continue Lovenox  BID    Chronic diastolic CHF  -EF 13% on echogram in 2012 - currently euvolemic   HTN  -BP within AHA guidelines; when patient becomes more ambulatory may have to back off of her BP medication   UTI  -Current abx should provide adequate coverage - appears that urine was not sent for cx   Hypothyroid  -Continue Synthroid 12.5 mcg daily    Hx of Fe deficient anemia  -Fe studies most c/w "anemia of chronic disease" i.e. likely a result of inflammatory state and recent poor nutrition   Macrocytosis  -B12 normal/high June 2014 - recheck notes adequate stores (658)   Mechanical fall with left open tibial fracture June 2015  -Cont current prescribed wound care (followed by Dr. Migdalia Dk) - In the a.m. contact [facility] Dr. Marcene Duos; patient has missed 2 followup appointments for her left lower extremity injury, secondary to being hospitalized  Hypokalemia -Potassium goal > 4   Hypomagnesemia -Magnesium goal > 2      Code Status:DNR Family Communication: family present at time of exam Disposition Plan: Per surgery   Consultants: Dr. Rolm Bookbinder (surgery)    Procedure/Significant Events: 5/28 Dr. Marcene Duos [facility]) performed  -Excisional debridement of open fracture, left open tib-fib fracture including skin,  subcutaneous tissue, muscle fascia, and bone.   - External fixation of open tib-fib fracture.   - Application of wound VAC.   - Application of antibiotic beads. 8/3 Left APPLICATION OF A-CELL/VAC TO LOWER  LEFT LEG WOUND by Dr. Theodoro Kos 8/7 - transgluteal percutaneous drainage in IR  8/7 - PICC line placement  8/8 -continue TNA  8/11 CT abdomen pelvis with contrast -Abscess in lower left pelvis is smaller with drain in place. Most of remaining fluid and air is slightly  posterior/lateral to the drainage catheter tip. Unsure remaining fluid connects to the area that has been decompressed.  -Evidence of generalized ileus. - moderate hiatal hernia.    Culture 8/6 MRSA by PCR negative 8/7 left buttocks positive Escherichia coli and Enterococcus; pan sensitive   Antibiotics: Cipro 8/06 >> stopped 8/7 Flagyl 8/06 >> stopped 8/7 Ertapenem 8/07 >> stopped 8/9  Zosyn 8/9 >>   DVT prophylaxis: IV heparin > lovenox    Devices 8/7 The superior medial buttocks JP drain   LINES / TUBES:  8/7 double-lumen midline PICC 8/10 20 ga left forearm   Continuous Infusions: . sodium chloride 10 mL/hr at 09/03/13 2000  . diltiazem (CARDIZEM) infusion 5 mg/hr (09/04/13 0800)  . Marland KitchenTPN (CLINIMIX-E) Adult 70 mL/hr at 09/03/13 2000   And  . fat emulsion 240 mL (09/03/13 2000)  . Marland KitchenTPN (CLINIMIX-E) Adult     And  . fat emulsion      Objective: VITAL SIGNS: Temp: 99.8 F (37.7 C) (08/16 1200) Temp src: Oral (08/16 1200) BP: 114/57 mmHg (08/16 1200) Pulse Rate: 87 (08/16 1200) SPO2; 100% on room air FIO2:   Intake/Output Summary (Last 24 hours) at 09/04/13 1250 Last data filed at 09/04/13 1100  Gross per 24 hour  Intake 3027.17 ml  Output   3025 ml  Net   2.17 ml     Exam: General: A./O. x4, NAD, No acute respiratory distress, resting in bed comfortably Lungs: Clear to auscultation bilaterally without wheezes or crackles Cardiovascular: Regular rhythm and rate, without murmur gallop or rub normal S1 and S2 Abdomen: Nontender, nondistended, hypoactive bowel sounds, no rebound, no ascites, no appreciable mass, left JP drain serous fluid Extremities: No significant cyanosis, clubbing, negative edema right lower extremity; bruit on left lower extremity with wound VAC   Data Reviewed: Basic Metabolic Panel:  Recent Labs Lab 08/29/13 0325  08/31/13 0350 09/01/13 0500 09/01/13 0900 09/02/13 0830 09/03/13 0500 09/04/13 0238  NA 138  < > 136*  --   133* 134* 132* 132*  K 3.5*  < > 3.4*  --  3.9 3.7 4.3 3.9  CL 100  < > 96  --  96 98 97 97  CO2 24  < > 27  --  28 26 24 24   GLUCOSE 148*  < > 118*  --  152* 151* 198* 160*  BUN 10  < > 12  --  13 13 13 14   CREATININE 0.68  < > 0.66  --  0.56 0.60 0.64 0.69  CALCIUM 8.3*  < > 8.1*  --  8.3* 8.2* 8.5 8.5  MG 1.7  < > 1.9  --  1.7 1.7 1.8 1.9  PHOS 2.6  --   --  2.8  --   --  3.1 2.7  < > = values in this interval not displayed. Liver Function Tests:  Recent Labs Lab 08/31/13 0350 09/01/13 0900 09/02/13 0830 09/03/13 0500 09/04/13 0238  AST 21 27 30 30 29   ALT 21 25 37* 39* 42*  ALKPHOS 84 79 84 87 86  BILITOT 0.9 0.7 0.6 0.6 0.5  PROT 6.0 6.4 6.1 6.1 6.3  ALBUMIN 2.1* 2.2* 2.2* 2.1* 2.2*   No  results found for this basename: LIPASE, AMYLASE,  in the last 168 hours No results found for this basename: AMMONIA,  in the last 168 hours CBC:  Recent Labs Lab 08/31/13 0350 09/01/13 0500 09/02/13 0830 09/03/13 0500 09/04/13 0238  WBC 12.0* 11.8* 11.9* 10.0 7.9  NEUTROABS 9.4* 8.8* 9.8* 7.9* 5.9  HGB 11.4* 10.9* 10.6* 10.5* 10.4*  HCT 33.8* 31.5* 32.4* 31.8* 31.3*  MCV 107.3* 104.7* 102.5* 102.3* 102.0*  PLT 327 352 364 371 364   Cardiac Enzymes: No results found for this basename: CKTOTAL, CKMB, CKMBINDEX, TROPONINI,  in the last 168 hours BNP (last 3 results) No results found for this basename: PROBNP,  in the last 8760 hours CBG:  Recent Labs Lab 09/03/13 1614 09/03/13 2118 09/03/13 2342 09/04/13 0541 09/04/13 1201  GLUCAP 213* 171* 141* 196* 143*    Recent Results (from the past 240 hour(s))  CULTURE, BLOOD (ROUTINE X 2)     Status: None   Collection Time    08/25/13  3:05 PM      Result Value Ref Range Status   Specimen Description BLOOD LEFT FOREARM   Final   Special Requests BOTTLES DRAWN AEROBIC ONLY 5CC   Final   Culture  Setup Time     Final   Value: 08/25/2013 18:56     Performed at [facility]   Culture     Final   Value: NO GROWTH 5  DAYS     Performed at [facility]   Report Status 08/31/2013 FINAL   Final  CULTURE, BLOOD (ROUTINE X 2)     Status: None   Collection Time    08/25/13  3:45 PM      Result Value Ref Range Status   Specimen Description BLOOD LEFT FOREARM   Final   Special Requests BOTTLES DRAWN AEROBIC ONLY 5CC   Final   Culture  Setup Time     Final   Value: 08/25/2013 20:08     Performed at [facility]     Final   Value: NO GROWTH 5 DAYS     Performed at [facility]   Report Status 08/31/2013 FINAL   Final  MRSA PCR SCREENING     Status: None   Collection Time    08/25/13  6:02 PM      Result Value Ref Range Status   MRSA by PCR NEGATIVE  NEGATIVE Final   Comment:            The GeneXpert MRSA Assay (FDA     approved for NASAL specimens     only), is one component of a     comprehensive MRSA colonization     surveillance program. It is not     intended to diagnose MRSA     infection nor to guide or     monitor treatment for     MRSA infections.  SURGICAL PCR SCREEN     Status: None   Collection Time    08/26/13 10:27 AM      Result Value Ref Range Status   MRSA, PCR NEGATIVE  NEGATIVE Final   Staphylococcus aureus NEGATIVE  NEGATIVE Final   Comment:            The Xpert SA Assay (FDA     approved for NASAL specimens     in patients over 76 years of age),     is one component of     a comprehensive  surveillance     program.  Test performance has     been validated by [facility] for patients greater     than or equal to 47 year old.     It is not intended     to diagnose infection nor to     guide or monitor treatment.  CULTURE, ROUTINE-ABSCESS     Status: None   Collection Time    08/26/13  2:04 PM      Result Value Ref Range Status   Specimen Description BUTTOCKS LEFT   Final   Special Requests LEFT UPPER BUTTOCK JP DRAIN   Final   Gram Stain     Final   Value: ABUNDANT WBC PRESENT,BOTH PMN AND MONONUCLEAR     NO SQUAMOUS EPITHELIAL  CELLS SEEN     ABUNDANT GRAM POSITIVE RODS     ABUNDANT GRAM NEGATIVE RODS     FEW GRAM POSITIVE COCCI   Culture     Final   Value: MODERATE ESCHERICHIA COLI     MODERATE ENTEROCOCCUS SPECIES     Performed at [facility]   Report Status 08/29/2013 FINAL   Final   Organism ID, Bacteria ESCHERICHIA COLI   Final   Organism ID, Bacteria ENTEROCOCCUS SPECIES   Final  ANAEROBIC CULTURE     Status: None   Collection Time    08/26/13  2:04 PM      Result Value Ref Range Status   Specimen Description ABSCESS BUTTOCKS LEFT   Final   Special Requests LEFT UPPER BUTTOCK JP DRAIN   Final   Gram Stain     Final   Value: ABUNDANT WBC PRESENT,BOTH PMN AND MONONUCLEAR     NO SQUAMOUS EPITHELIAL CELLS SEEN     ABUNDANT GRAM POSITIVE RODS     ABUNDANT GRAM NEGATIVE RODS     FEW GRAM POSITIVE COCCI   Culture     Final   Value: ABUNDANT BACTEROIDES THETAIOTAOMICRON     Note: BETA LACTAMASE POSITIVE     Performed at [facility]   Report Status 09/02/2013 FINAL   Final  CLOSTRIDIUM DIFFICILE BY PCR     Status: None   Collection Time    08/30/13  6:09 AM      Result Value Ref Range Status   C difficile by pcr NEGATIVE  NEGATIVE Final     Studies:  Recent x-ray studies have been reviewed in detail by the Attending Physician  Scheduled Meds:  Scheduled Meds: . busPIRone  7.5 mg Oral BID  . enoxaparin (LOVENOX) injection  85 mg Subcutaneous Q12H  . insulin aspart  0-9 Units Subcutaneous 4 times per day  . levothyroxine  12.5 mcg Intravenous Daily  . metoprolol  5 mg Intravenous 4 times per day  . piperacillin-tazobactam (ZOSYN)  IV  3.375 g Intravenous 3 times per day  . temazepam  30 mg Oral QHS    Time spent on care of this patient: 40 mins   Allie Bossier , MD   [facility] Office  [phone] Pager [phone]  On-Call/Text Page:      [email or web address]      password [email or web address]  If 7PM-7AM, please contact night-coverage [email or web address] Password  [email or web address] 09/04/2013, 12:50 PM   LOS: 10 days

## 2013-09-05 ENCOUNTER — Inpatient Hospital Stay (HOSPITAL_COMMUNITY): Payer: Medicare Other

## 2013-09-05 ENCOUNTER — Encounter (HOSPITAL_COMMUNITY): Payer: Self-pay | Admitting: Radiology

## 2013-09-05 LAB — COMPREHENSIVE METABOLIC PANEL
ALT: 39 U/L — ABNORMAL HIGH (ref 0–35)
AST: 28 U/L (ref 0–37)
Albumin: 2.2 g/dL — ABNORMAL LOW (ref 3.5–5.2)
Alkaline Phosphatase: 79 U/L (ref 39–117)
Anion gap: 10 (ref 5–15)
BUN: 14 mg/dL (ref 6–23)
CO2: 25 mEq/L (ref 19–32)
Calcium: 8.6 mg/dL (ref 8.4–10.5)
Chloride: 97 mEq/L (ref 96–112)
Creatinine, Ser: 0.7 mg/dL (ref 0.50–1.10)
GFR calc Af Amer: 89 mL/min — ABNORMAL LOW (ref >90)
GFR calc non Af Amer: 77 mL/min — ABNORMAL LOW (ref >90)
Glucose, Bld: 143 mg/dL — ABNORMAL HIGH (ref 70–99)
Potassium: 4 mEq/L (ref 3.7–5.3)
Sodium: 132 mEq/L — ABNORMAL LOW (ref 137–147)
Total Bilirubin: 0.4 mg/dL (ref 0.3–1.2)
Total Protein: 6.3 g/dL (ref 6.0–8.3)

## 2013-09-05 LAB — GLUCOSE, CAPILLARY
Glucose-Capillary: 117 mg/dL — ABNORMAL HIGH (ref 70–99)
Glucose-Capillary: 122 mg/dL — ABNORMAL HIGH (ref 70–99)
Glucose-Capillary: 152 mg/dL — ABNORMAL HIGH (ref 70–99)
Glucose-Capillary: 153 mg/dL — ABNORMAL HIGH (ref 70–99)
Glucose-Capillary: 153 mg/dL — ABNORMAL HIGH (ref 70–99)
Glucose-Capillary: 154 mg/dL — ABNORMAL HIGH (ref 70–99)

## 2013-09-05 LAB — CBC WITH DIFFERENTIAL/PLATELET
Basophils Absolute: 0.1 10*3/uL (ref 0.0–0.1)
Basophils Relative: 1 % (ref 0–1)
Eosinophils Absolute: 0.3 10*3/uL (ref 0.0–0.7)
Eosinophils Relative: 4 % (ref 0–5)
HCT: 30.6 % — ABNORMAL LOW (ref 36.0–46.0)
Hemoglobin: 10.1 g/dL — ABNORMAL LOW (ref 12.0–15.0)
Lymphocytes Relative: 15 % (ref 12–46)
Lymphs Abs: 1 10*3/uL (ref 0.7–4.0)
MCH: 33.8 pg (ref 26.0–34.0)
MCHC: 33 g/dL (ref 30.0–36.0)
MCV: 102.3 fL — ABNORMAL HIGH (ref 78.0–100.0)
Monocytes Absolute: 0.8 10*3/uL (ref 0.1–1.0)
Monocytes Relative: 12 % (ref 3–12)
Neutro Abs: 4.9 10*3/uL (ref 1.7–7.7)
Neutrophils Relative %: 68 % (ref 43–77)
Platelets: 383 10*3/uL (ref 150–400)
RBC: 2.99 MIL/uL — ABNORMAL LOW (ref 3.87–5.11)
RDW: 12.7 % (ref 11.5–15.5)
WBC: 7 10*3/uL (ref 4.0–10.5)

## 2013-09-05 LAB — PREALBUMIN: Prealbumin: 13.8 mg/dL — ABNORMAL LOW (ref 17.0–34.0)

## 2013-09-05 LAB — MAGNESIUM: Magnesium: 1.6 mg/dL (ref 1.5–2.5)

## 2013-09-05 LAB — TRIGLYCERIDES: Triglycerides: 82 mg/dL (ref <150)

## 2013-09-05 LAB — PROTIME-INR
INR: 1.16 (ref 0.00–1.49)
Prothrombin Time: 14.8 seconds (ref 11.6–15.2)

## 2013-09-05 LAB — PHOSPHORUS: Phosphorus: 3.1 mg/dL (ref 2.3–4.6)

## 2013-09-05 MED ORDER — IOHEXOL 300 MG/ML  SOLN
25.0000 mL | INTRAMUSCULAR | Status: AC
  Administered 2013-09-05: 25 mL via ORAL

## 2013-09-05 MED ORDER — METOPROLOL TARTRATE 25 MG PO TABS
25.0000 mg | ORAL_TABLET | Freq: Two times a day (BID) | ORAL | Status: DC
  Administered 2013-09-05 – 2013-09-08 (×7): 25 mg via ORAL
  Filled 2013-09-05 (×9): qty 1

## 2013-09-05 MED ORDER — TRACE MINERALS CR-CU-F-FE-I-MN-MO-SE-ZN IV SOLN
INTRAVENOUS | Status: AC
  Administered 2013-09-05: 17:00:00 via INTRAVENOUS
  Filled 2013-09-05: qty 1000

## 2013-09-05 MED ORDER — FAT EMULSION 20 % IV EMUL
250.0000 mL | INTRAVENOUS | Status: AC
  Administered 2013-09-05: 250 mL via INTRAVENOUS
  Filled 2013-09-05: qty 250

## 2013-09-05 MED ORDER — IOHEXOL 300 MG/ML  SOLN
100.0000 mL | Freq: Once | INTRAMUSCULAR | Status: AC | PRN
  Administered 2013-09-05: 100 mL via INTRAVENOUS

## 2013-09-05 MED ORDER — LEVOTHYROXINE SODIUM 25 MCG PO TABS
25.0000 ug | ORAL_TABLET | Freq: Every day | ORAL | Status: DC
  Administered 2013-09-06 – 2013-09-08 (×3): 25 ug via ORAL
  Filled 2013-09-05 (×4): qty 1

## 2013-09-05 MED ORDER — MAGNESIUM SULFATE 40 MG/ML IJ SOLN
2.0000 g | Freq: Once | INTRAMUSCULAR | Status: AC
  Administered 2013-09-05: 2 g via INTRAVENOUS
  Filled 2013-09-05: qty 50

## 2013-09-05 NOTE — Progress Notes (Signed)
[place] TEAM 1 - Stepdown/ICU TEAM Progress Note  [name] WCB:[id number] DOB: 1925-05-26 DOA: 08/25/2013 PCP: Walker Kehr, MD  Admit HPI / Brief Narrative: 80 y.o. female w/ a past medical history of atrial fibrillation on chronic anticoagulation and recent fall which led to an open fracture of the lower extremity requiring surgical intervention and wound care with wound VAC. Patient was in skilled nursing for rehabilitation and started having L lower quadrant abdominal pain.   CT scan of the abdomen was noteable for inflammatory changes in the pelvis and right lower quadrant with wall thickening of small bowel adjacent loops concerning for infectious ileitis and findings consistent with a possible ruptured diverticulitis and abscess. General Surgery was called for consultation. Lab work noted white blood cell count 21, anion gap of 16 with lactic acid level of 3.76. Patient was started on IV Cipro and Flagyl.   The pt's tx course has been protracted.  Gen Surg has attended to her GI abscess, with a drain being placed in IR, and her diet being limited to full liquids only.  TNA has been utilized for supplementation due to poor oral intake.  Her medical course has been complicated by difficult to control RVR in the setting of chronic afib, as well as numerous electrolyte disturbances.   Her rate control has now been much more consistent, and she has been titrated back to her usual oral meds.  She will need further prolongation of her stay to allow for tx of her bowel issues.     Significant Events: 8/7 - transgluteal percutaneous drainage in IR 8/7 - PICC line placement 8/8 - begin TNA   HPI/Subjective: Affect is much brighter and hopeful today.  She reports poor intake of her current diet.  She denies cp, sob, or ha.    Assessment/Plan:  Diverticulitis of colon with perforation As per Gen Surg - s/p L transgluteal percutaneous drainage in IR 8/7 - diet per Gen Surg, anticipate  very slow advancement - abscess cultures noting enterococcus which is pan sensitive as well as pan sensitive E coli - ertapenem was changed to zosyn previously as pharmacy suggested ertapenem provides poor enterococcus coverage - cont TNA - f/u CT suggest area of fluid near or ?connected to area where drain placed which appears to not be draining, though are w/ drain is smaller - for repeat f/u CT   Sepsis (infection, WBC 25, HR 112) Hemodynamically stable - WBC stable - sepsis physiology has resolved   Metabolic acidosis resolved w/ volume resuscitation   Hypokalemia  Corrected   Hypomagnesemia Mg is normal today   Chronic afib w/ acute RVR RVR resolved and now on oral cardiac meds - full dose lovenox for anticoag   Chronic diastolic CHF EF 61% on echogram in 2012 - currently euvolemic   UTI Has completed a full course of tx for this issue - appears that urine was not sent for cx  HTN BP currently well controlled - follow  Hypothyroid  Cont home synthroid dose  Hx of Fe deficient anemia Fe studies most c/w "anemia of chronic disease" i.e. likely a result of inflammatory state and recent poor nutrition   Macrocytosis B12 normal/high June 2014 - recheck notes adequate stores (658)  Mechanical fall with left open tibial fracture June 2015 Cont current prescribed wound care (followed by Dr. Migdalia Dk) - dressing changed per Plastics today - f/u xray of L tibia obtained per request of outpt Ortho MD   Obesity Unspecified  Code Status: NO CODE BLUE  Family Communication: spoke w/ daughter at bedside  Disposition Plan: transfer to tele bed on surgical floor  Consultants: Gen Surgery  IR  Antibiotics: Cipro 8/06 Flagyl 8/06 Ertapenem 8/07 >>8/9 Zosyn 8/9 >>  DVT prophylaxis: lovenox   Objective: Blood pressure 122/66, pulse 96, temperature 98.5 F (36.9 C), temperature source Oral, resp. rate 18, height 5\' 3"  (1.6 m), weight 83.2 kg (183 lb 6.8 oz), SpO2  100.00%.  Intake/Output Summary (Last 24 hours) at 09/05/13 1737 Last data filed at 09/05/13 1717  Gross per 24 hour  Intake 2725.67 ml  Output   2250 ml  Net 475.67 ml   Exam: General: No acute respiratory distress Lungs: Clear to auscultation bilaterally without wheezes or crackles Cardiovascular: irreg irreg - rate controlled - no appreciable M Abdomen: obese, soft, diffusely tender - no rebound - no appreciable mass - BS noted  Extremities: no significant cyanosis, clubbing, or edema bilateral lower extremities - L LE wound dressed with VAC in place   Data Reviewed: Basic Metabolic Panel:  Recent Labs Lab 09/01/13 0500 09/01/13 0900 09/02/13 0830 09/03/13 0500 09/04/13 0238 09/05/13 0300  NA  --  133* 134* 132* 132* 132*  K  --  3.9 3.7 4.3 3.9 4.0  CL  --  96 98 97 97 97  CO2  --  28 26 24 24 25   GLUCOSE  --  152* 151* 198* 160* 143*  BUN  --  13 13 13 14 14   CREATININE  --  0.56 0.60 0.64 0.69 0.70  CALCIUM  --  8.3* 8.2* 8.5 8.5 8.6  MG  --  1.7 1.7 1.8 1.9 1.6  PHOS 2.8  --   --  3.1 2.7 3.1   Liver Function Tests:  Recent Labs Lab 09/01/13 0900 09/02/13 0830 09/03/13 0500 09/04/13 0238 09/05/13 0300  AST 27 30 30 29 28   ALT 25 37* 39* 42* 39*  ALKPHOS 79 84 87 86 79  BILITOT 0.7 0.6 0.6 0.5 0.4  PROT 6.4 6.1 6.1 6.3 6.3  ALBUMIN 2.2* 2.2* 2.1* 2.2* 2.2*   No results found for this basename: LIPASE, AMYLASE,  in the last 168 hours Coags:  Recent Labs Lab 09/01/13 0500 09/02/13 0615 09/03/13 0500 09/04/13 0238 09/05/13 0300  INR 1.18 1.16 1.18 1.21 1.16   No results found for this basename: PTT,  in the last 168 hours  CBC:  Recent Labs Lab 09/01/13 0500 09/02/13 0830 09/03/13 0500 09/04/13 0238 09/05/13 0300  WBC 11.8* 11.9* 10.0 7.9 7.0  NEUTROABS 8.8* 9.8* 7.9* 5.9 4.9  HGB 10.9* 10.6* 10.5* 10.4* 10.1*  HCT 31.5* 32.4* 31.8* 31.3* 30.6*  MCV 104.7* 102.5* 102.3* 102.0* 102.3*  PLT 352 364 371 364 383   CBG:  Recent  Labs Lab 09/05/13 0036 09/05/13 0332 09/05/13 0731 09/05/13 1140 09/05/13 1651  GLUCAP 122* 154* 152* 153* 117*    Recent Results (from the past 240 hour(s))  CLOSTRIDIUM DIFFICILE BY PCR     Status: None   Collection Time    08/30/13  6:09 AM      Result Value Ref Range Status   C difficile by pcr NEGATIVE  NEGATIVE Final     Studies:  Recent x-ray studies have been reviewed in detail by the Attending Physician  Scheduled Meds:  Scheduled Meds: . busPIRone  7.5 mg Oral BID  . diltiazem  30 mg Oral Q12H  . enoxaparin (LOVENOX) injection  85 mg Subcutaneous Q12H  .  insulin aspart  0-9 Units Subcutaneous TID WC & HS  . [START ON 09/06/2013] levothyroxine  25 mcg Oral QAC breakfast  . metoprolol tartrate  25 mg Oral BID  . piperacillin-tazobactam (ZOSYN)  IV  3.375 g Intravenous 3 times per day  . temazepam  30 mg Oral QHS    Time spent on care of this patient: 35 mins   Jeyden Coffelt T , MD   [facility] Office  [phone] Pager - Text Page per Shea Evans as per below:  On-Call/Text Page:      [email or web address]      password [email or web address]  If 7PM-7AM, please contact night-coverage [email or web address] Password [email or web address] 09/05/2013, 5:37 PM   LOS: 11 days

## 2013-09-05 NOTE — Progress Notes (Signed)
Physical Therapy Treatment Patient Details Name: [name] MRN: [id number] DOB: [birth year] Today's Date: 09/05/2013    History of Present Illness Pt is an 80 y/o female admitted from SNF with abdominal pain. Was found to be septic and pt is s/p left pelvic/diverticular abscess drainage 8/7. PMH includes fall with open fracture on LLE 1 month ago. Pt was at [facility] s/p surgical intervention with wound vac. Pt states she is TDWB however MD note states NWB.     PT Comments    Pt progressing slowly towards physical therapy goals. Goal of session was to focus on sit<>stand and maintaining NWB status on the LLE. Pt to go for CT scan after therapy, and was returned to bed because of this. Pt and daughter instructed in HEP and recommended OOB with staff for supper tonight if able.   Follow Up Recommendations  SNF;Supervision/Assistance - 24 hour     Equipment Recommendations  None recommended by PT    Recommendations for Other Services       Precautions / Restrictions Precautions Precautions: Fall Precaution Comments: Wound vac Restrictions Weight Bearing Restrictions: Yes LLE Weight Bearing: Non weight bearing    Mobility  Bed Mobility Overal bed mobility: Needs Assistance Bed Mobility: Supine to Sit;Sit to Supine     Supine to sit: Min guard Sit to supine: Min assist   General bed mobility comments: VC's for sequencing and technique. Increased time for pt to scoot to EOB enough for feet to rest on floor, and assist to elevate LE's into bed upon return to supine.   Transfers Overall transfer level: Needs assistance Equipment used:  Charlaine Dalton) Transfers: Sit to/from Stand Sit to Stand: Mod assist;+2 physical assistance;+2 safety/equipment         General transfer comment: Charlaine Dalton was utilized for pt to pull to stand x3, holding 20-30 seconds each time. Pt continues to have difficulty maintaining NWB status on the LLE, however when cued for TTWB she was able to maintain that for  a short period of time (~10 seconds at a time).   Ambulation/Gait                 Stairs            Wheelchair Mobility    Modified Rankin (Stroke Patients Only)       Balance Overall balance assessment: Needs assistance Sitting-balance support: Feet supported;No upper extremity supported Sitting balance-Leahy Scale: Fair     Standing balance support: Bilateral upper extremity supported Standing balance-Leahy Scale: Zero                      Cognition Arousal/Alertness: Lethargic Behavior During Therapy: Anxious Overall Cognitive Status: Impaired/Different from baseline Area of Impairment: Memory;Problem solving     Memory: Decreased short-term memory       Problem Solving: Slow processing;Decreased initiation;Requires verbal cues;Difficulty sequencing      Exercises General Exercises - Lower Extremity Ankle Circles/Pumps: 15 reps Quad Sets: 10 reps Straight Leg Raises: 10 reps    General Comments        Pertinent Vitals/Pain Pain Assessment: No/denies pain    Home Living                      Prior Function            PT Goals (current goals can now be found in the care plan section) Acute Rehab PT Goals Patient Stated Goal: To return to [facility] at d/c for  continued rehab PT Goal Formulation: With patient/family Time For Goal Achievement: 09/12/13 Potential to Achieve Goals: Good Progress towards PT goals: Progressing toward goals    Frequency  Min 2X/week    PT Plan Current plan remains appropriate    Co-evaluation             End of Session Equipment Utilized During Treatment: Gait belt;Oxygen Charlaine Dalton) Activity Tolerance: Patient limited by fatigue;Treatment limited secondary to medical complications (Comment) (Nausea) Patient left: in bed;with call bell/phone within reach;with family/visitor present     Time: 1749-4496 PT Time Calculation (min): 28 min  Charges:  $Therapeutic Exercise: 8-22  mins $Therapeutic Activity: 8-22 mins                    G Codes:      Jolyn Lent 09/13/2013, 5:19 PM  Jolyn Lent, PT, DPT Acute Rehabilitation Services Pager: [phone]

## 2013-09-05 NOTE — Progress Notes (Signed)
  Subjective: Patient tolerating full liquids - having BM Minimal RLQ/suprapubic tenderness Afebrile  Objective: Vital signs in last 24 hours: Temp:  [98.4 F (36.9 C)-99.8 F (37.7 C)] 98.4 F (36.9 C) (08/17 0728) Pulse Rate:  [87] 87 (08/16 1455) Resp:  [18-28] 18 (08/17 0728) BP: (113-135)/(34-77) 135/77 mmHg (08/17 0728) SpO2:  [86 %-99 %] 98 % (08/17 0728) Last BM Date: 09/04/13  Intake/Output from previous day: 08/16 0701 - 08/17 0700 In: 3190.4 [P.O.:880; I.V.:320.4; IV Piggyback:50; TPN:1940] Out: 1950 [Urine:1950] Intake/Output this shift:    General appearance: alert, cooperative and no distress GI: soft, minimal lower abdominal tenderness  Lab Results:   Recent Labs  09/04/13 0238 09/05/13 0300  WBC 7.9 7.0  HGB 10.4* 10.1*  HCT 31.3* 30.6*  PLT 364 383   BMET  Recent Labs  09/04/13 0238 09/05/13 0300  NA 132* 132*  K 3.9 4.0  CL 97 97  CO2 24 25  GLUCOSE 160* 143*  BUN 14 14  CREATININE 0.69 0.70  CALCIUM 8.5 8.6   PT/INR  Recent Labs  09/04/13 0238 09/05/13 0300  LABPROT 15.3* 14.8  INR 1.21 1.16   ABG No results found for this basename: PHART, PCO2, PO2, HCO3,  in the last 72 hours  Studies/Results: No results found.  Anti-infectives: Anti-infectives   Start     Dose/Rate Route Frequency Ordered Stop   08/28/13 1400  piperacillin-tazobactam (ZOSYN) IVPB 3.375 g     3.375 g 12.5 mL/hr over 240 Minutes Intravenous 3 times per day 08/28/13 1126     08/26/13 0800  ertapenem (INVANZ) 1 g in sodium chloride 0.9 % 50 mL IVPB  Status:  Discontinued     1 g 100 mL/hr over 30 Minutes Intravenous Every 24 hours 08/26/13 0747 08/28/13 1126   08/26/13 0200  ciprofloxacin (CIPRO) IVPB 400 mg  Status:  Discontinued     400 mg 200 mL/hr over 60 Minutes Intravenous Every 12 hours 08/25/13 1646 08/26/13 0747   08/25/13 1645  metroNIDAZOLE (FLAGYL) IVPB 500 mg  Status:  Discontinued     500 mg 100 mL/hr over 60 Minutes Intravenous Every  8 hours 08/25/13 1636 08/26/13 0747   08/25/13 1345  ciprofloxacin (CIPRO) IVPB 400 mg     400 mg 200 mL/hr over 60 Minutes Intravenous  Once 08/25/13 1344 08/25/13 1515   08/25/13 1345  metroNIDAZOLE (FLAGYL) IVPB 500 mg     500 mg 100 mL/hr over 60 Minutes Intravenous  Once 08/25/13 1344 08/25/13 1554      Assessment/Plan: s/p * No surgery found * Acute diverticulitis with pelvic abscess s/p percutaneous drainage Improving clinically Will obtain follow-up CT scan to see if abscess has resolved and drain can be removed. Full liquids now - If CT ok will advance to low-residue diet.    LOS: 11 days    [name],[name] K. 09/05/2013

## 2013-09-05 NOTE — Progress Notes (Signed)
  Subjective: L TG diverticular abscess drain placed 8/7 CT 8/12 showed resolution of abscess with other small collections noted Rec: continue to drain Recheck in 5-7 days Pt continues to improve   Objective: Vital signs in last 24 hours: Temp:  [98.4 F (36.9 C)-99.8 F (37.7 C)] 98.4 F (36.9 C) (08/17 0728) Pulse Rate:  [87] 87 (08/16 1455) Resp:  [18-28] 18 (08/17 0728) BP: (113-135)/(34-77) 135/77 mmHg (08/17 0728) SpO2:  [86 %-99 %] 98 % (08/17 0728) Last BM Date: 09/04/13  Intake/Output from previous day: 08/16 0701 - 08/17 0700 In: 3190.4 [P.O.:880; I.V.:320.4; IV Piggyback:50; TPN:1940] Out: 1950 [Urine:1950] Intake/Output this shift:    PE:  Afeb; vss Minimal output from drain for days Site clean and dry; sl tender +BS Small BM yesterday  Lab Results:   Recent Labs  09/04/13 0238 09/05/13 0300  WBC 7.9 7.0  HGB 10.4* 10.1*  HCT 31.3* 30.6*  PLT 364 383   BMET  Recent Labs  09/04/13 0238 09/05/13 0300  NA 132* 132*  K 3.9 4.0  CL 97 97  CO2 24 25  GLUCOSE 160* 143*  BUN 14 14  CREATININE 0.69 0.70  CALCIUM 8.5 8.6   PT/INR  Recent Labs  09/04/13 0238 09/05/13 0300  LABPROT 15.3* 14.8  INR 1.21 1.16   ABG No results found for this basename: PHART, PCO2, PO2, HCO3,  in the last 72 hours  Studies/Results: No results found.  Anti-infectives: Anti-infectives   Start     Dose/Rate Route Frequency Ordered Stop   08/28/13 1400  piperacillin-tazobactam (ZOSYN) IVPB 3.375 g     3.375 g 12.5 mL/hr over 240 Minutes Intravenous 3 times per day 08/28/13 1126     08/26/13 0800  ertapenem (INVANZ) 1 g in sodium chloride 0.9 % 50 mL IVPB  Status:  Discontinued     1 g 100 mL/hr over 30 Minutes Intravenous Every 24 hours 08/26/13 0747 08/28/13 1126   08/26/13 0200  ciprofloxacin (CIPRO) IVPB 400 mg  Status:  Discontinued     400 mg 200 mL/hr over 60 Minutes Intravenous Every 12 hours 08/25/13 1646 08/26/13 0747   08/25/13 1645   metroNIDAZOLE (FLAGYL) IVPB 500 mg  Status:  Discontinued     500 mg 100 mL/hr over 60 Minutes Intravenous Every 8 hours 08/25/13 1636 08/26/13 0747   08/25/13 1345  ciprofloxacin (CIPRO) IVPB 400 mg     400 mg 200 mL/hr over 60 Minutes Intravenous  Once 08/25/13 1344 08/25/13 1515   08/25/13 1345  metroNIDAZOLE (FLAGYL) IVPB 500 mg     500 mg 100 mL/hr over 60 Minutes Intravenous  Once 08/25/13 1344 08/25/13 1554      Assessment/Plan: s/p * No surgery found *  L TG drain intact For recheck CT this week per CCS Will follow   LOS: 11 days    Nichael Ehly A 09/05/2013

## 2013-09-05 NOTE — Progress Notes (Signed)
Agree 

## 2013-09-05 NOTE — Progress Notes (Signed)
PARENTERAL NUTRITION CONSULT NOTE - FOLLOW UP  Pharmacy Consult for TPN Indication: Diverticulitis s/p drain, inability to advance diet  Allergies  Allergen Reactions  . Atenolol Nausea And Vomiting  . Clarithromycin Nausea And Vomiting  . Codeine Sulfate Nausea Only  . Macrodantin Other (See Comments)    Long time ago - reaction unknown  . Percocet [Oxycodone-Acetaminophen] Nausea And Vomiting    Patient Measurements: Height: 5\' 3"  (160 cm) Weight: 183 lb 6.8 oz (83.2 kg) IBW/kg (Calculated) : 52.4 Adjusted Body Weight: 62.2 kg  Vital Signs: Temp: 99.3 F (37.4 C) (08/17 0329) Temp src: Oral (08/17 0329) BP: 132/68 mmHg (08/17 0329) Intake/Output from previous day: 08/16 0701 - 08/17 0700 In: 3190.4 [P.O.:880; I.V.:320.4; IV Piggyback:50; TPN:1940] Out: 1950 [Urine:1950] Intake/Output from this shift:    Labs:  Recent Labs  09/03/13 0500 09/04/13 0238 09/05/13 0300  WBC 10.0 7.9 7.0  HGB 10.5* 10.4* 10.1*  HCT 31.8* 31.3* 30.6*  PLT 371 364 383  INR 1.18 1.21 1.16     Recent Labs  09/03/13 0500 09/04/13 0238 09/05/13 0300  NA 132* 132* 132*  K 4.3 3.9 4.0  CL 97 97 97  CO2 24 24 25   GLUCOSE 198* 160* 143*  BUN 13 14 14   CREATININE 0.64 0.69 0.70  CALCIUM 8.5 8.5 8.6  MG 1.8 1.9 1.6  PHOS 3.1 2.7 3.1  PROT 6.1 6.3 6.3  ALBUMIN 2.1* 2.2* 2.2*  AST 30 29 28   ALT 39* 42* 39*  ALKPHOS 87 86 79  BILITOT 0.6 0.5 0.4  TRIG  --   --  82   Estimated Creatinine Clearance: 52.5 ml/min (by C-G formula based on Cr of 0.7).    Recent Labs  09/04/13 2021 09/05/13 0036 09/05/13 0332  GLUCAP 175* 122* 154*    Insulin Requirements in the past 24 hours:  7 units SSI  Current Nutrition:  Clinimix E 5/15 at 46ml/hr + 20% lipid emulsion at 10 ml/hr- providing 72g protein and 1214 kCal daily  Full liquid diet * 8/6: 25% of breakfast, 60% of lunch, 50% of dinner  Nutritional Goals:  1750-1950 kCal, 110-125 grams of protein per day per RD note on  8/12  Admit: Diverticulitis with abscess s/p drainage per IR  GI: Diverticulitis with abscess, drain placed 8/7. CT 8/12 showed resolution of abscess with other small fluid collections noted - to get repeat CT this week. Tolerating full liquid without n/v after episode of vomiting while on ice chips. Intake increasing since TPN rate reduced however per discussion with the patient, still with limited appetite - will reduce again today to half rate. Surgery getting repeat CT today - if good, will continue with diet advancement. Last BM 8/14. Prealbumin 8 (low) on 8/10  Endo: Last TSH from June was normal. On po synthroid. CBGs 143-175  Lytes: K 4, mag 1.6, Phos 3.1, CorCa 9.9. Na low, stable. Will plan to replace magnesium today.   Renal: IV fluids- NS at [facility]. UOP 1 ml/kg/hr, SCr 0.7- stable  Pulm: 97/RA  Cards: Afib, HTN, dHF. BP/HR wnl. Coumadin on hold and currently anticoagulated with full-dose lovenox 85mg /12h. Also on diltiazem IV, lopressor IV  Ortho: recent fall 6/15 which led to an open fracture of the lower extremity requiring surgical intervention and wound care with wound VAC.   Hepatobil: ALT mildly elevated - trending down, alb 2.2, trigs 82 on 8/17  Neuro/MSK: Restoril, Buspar  ID:  Zosyn total abx D#12 for diverticulitis + abscess with Enterococcus/Ecoli in  culture from JP drain and B-lactamase + bacteroides thetaiotaomicron in L-buttocks abscess culture. BC from 8/6 negative. Tmax/24h: 99.8, WBC wnl - f/u LOT.   Best Practices: Lovenox  TPN Access: PICC line (8/7)  TPN day#: 8 (start date 8/9)  Plan:  1. Reduce Clinimix E 5/15 to 40 ml/hr + lipid 20% emulsion at 5 ml/hr- this provides 48 g protein and 922 kcal daily. Attempting to stimulate appetite by reducing amount of nutrition given via TPN 2. Continue trace elements and daily multivitamin in TPN 3. Magnesium 2g IV x 1 dose this AM 4. Continue CBGs and SSI as ordered 5. Continue IVF as ordered 6. TPN labs as  ordered 7. Follow up tolerance of diet progression, appetite stimulation, ability to stop TPN  Alycia Rossetti, PharmD, BCPS Clinical Pharmacist Pager: [phone] 09/05/2013 8:55 AM

## 2013-09-06 DIAGNOSIS — R21 Rash and other nonspecific skin eruption: Secondary | ICD-10-CM

## 2013-09-06 DIAGNOSIS — I4892 Unspecified atrial flutter: Secondary | ICD-10-CM

## 2013-09-06 LAB — CBC WITH DIFFERENTIAL/PLATELET
Basophils Absolute: 0.1 10*3/uL (ref 0.0–0.1)
Basophils Relative: 1 % (ref 0–1)
Eosinophils Absolute: 0.3 10*3/uL (ref 0.0–0.7)
Eosinophils Relative: 5 % (ref 0–5)
HCT: 33.1 % — ABNORMAL LOW (ref 36.0–46.0)
Hemoglobin: 11.1 g/dL — ABNORMAL LOW (ref 12.0–15.0)
Lymphocytes Relative: 18 % (ref 12–46)
Lymphs Abs: 1 10*3/uL (ref 0.7–4.0)
MCH: 34.6 pg — ABNORMAL HIGH (ref 26.0–34.0)
MCHC: 33.5 g/dL (ref 30.0–36.0)
MCV: 103.1 fL — ABNORMAL HIGH (ref 78.0–100.0)
Monocytes Absolute: 0.9 10*3/uL (ref 0.1–1.0)
Monocytes Relative: 15 % — ABNORMAL HIGH (ref 3–12)
Neutro Abs: 3.5 10*3/uL (ref 1.7–7.7)
Neutrophils Relative %: 61 % (ref 43–77)
Platelets: 382 10*3/uL (ref 150–400)
RBC: 3.21 MIL/uL — ABNORMAL LOW (ref 3.87–5.11)
RDW: 12.7 % (ref 11.5–15.5)
WBC: 5.7 10*3/uL (ref 4.0–10.5)

## 2013-09-06 LAB — COMPREHENSIVE METABOLIC PANEL
ALT: 46 U/L — ABNORMAL HIGH (ref 0–35)
AST: 33 U/L (ref 0–37)
Albumin: 2.2 g/dL — ABNORMAL LOW (ref 3.5–5.2)
Alkaline Phosphatase: 83 U/L (ref 39–117)
Anion gap: 11 (ref 5–15)
BUN: 12 mg/dL (ref 6–23)
CO2: 26 mEq/L (ref 19–32)
Calcium: 8.6 mg/dL (ref 8.4–10.5)
Chloride: 99 mEq/L (ref 96–112)
Creatinine, Ser: 0.73 mg/dL (ref 0.50–1.10)
GFR calc Af Amer: 88 mL/min — ABNORMAL LOW (ref >90)
GFR calc non Af Amer: 76 mL/min — ABNORMAL LOW (ref >90)
Glucose, Bld: 119 mg/dL — ABNORMAL HIGH (ref 70–99)
Potassium: 3.7 mEq/L (ref 3.7–5.3)
Sodium: 136 mEq/L — ABNORMAL LOW (ref 137–147)
Total Bilirubin: 0.4 mg/dL (ref 0.3–1.2)
Total Protein: 6.5 g/dL (ref 6.0–8.3)

## 2013-09-06 LAB — GLUCOSE, CAPILLARY
Glucose-Capillary: 128 mg/dL — ABNORMAL HIGH (ref 70–99)
Glucose-Capillary: 140 mg/dL — ABNORMAL HIGH (ref 70–99)
Glucose-Capillary: 129 mg/dL — ABNORMAL HIGH (ref 70–99)
Glucose-Capillary: 165 mg/dL — ABNORMAL HIGH (ref 70–99)

## 2013-09-06 LAB — PROTIME-INR
INR: 1.22 (ref 0.00–1.49)
Prothrombin Time: 15.4 seconds — ABNORMAL HIGH (ref 11.6–15.2)

## 2013-09-06 LAB — MAGNESIUM: Magnesium: 2 mg/dL (ref 1.5–2.5)

## 2013-09-06 MED ORDER — NYSTATIN 100000 UNIT/GM EX POWD
Freq: Two times a day (BID) | CUTANEOUS | Status: DC
  Administered 2013-09-06 – 2013-09-08 (×4): via TOPICAL
  Filled 2013-09-06: qty 15

## 2013-09-06 MED ORDER — ENSURE COMPLETE PO LIQD
237.0000 mL | ORAL | Status: DC
  Administered 2013-09-07 – 2013-09-08 (×2): 237 mL via ORAL

## 2013-09-06 MED ORDER — PRO-STAT SUGAR FREE PO LIQD
30.0000 mL | ORAL | Status: DC
  Administered 2013-09-07: 30 mL via ORAL
  Filled 2013-09-06 (×3): qty 30

## 2013-09-06 MED ORDER — FAMOTIDINE 20 MG PO TABS
20.0000 mg | ORAL_TABLET | Freq: Two times a day (BID) | ORAL | Status: DC | PRN
  Administered 2013-09-06: 20 mg via ORAL
  Filled 2013-09-06: qty 1

## 2013-09-06 MED ORDER — FAT EMULSION 20 % IV EMUL
250.0000 mL | INTRAVENOUS | Status: DC
  Administered 2013-09-06: 250 mL via INTRAVENOUS
  Filled 2013-09-06: qty 250

## 2013-09-06 MED ORDER — POTASSIUM CHLORIDE 10 MEQ/50ML IV SOLN
10.0000 meq | INTRAVENOUS | Status: AC
  Administered 2013-09-06 (×2): 10 meq via INTRAVENOUS
  Filled 2013-09-06 (×2): qty 50

## 2013-09-06 MED ORDER — DIPHENHYDRAMINE HCL 50 MG/ML IJ SOLN
12.5000 mg | Freq: Four times a day (QID) | INTRAMUSCULAR | Status: DC | PRN
  Administered 2013-09-06 – 2013-09-07 (×2): 12.5 mg via INTRAVENOUS
  Filled 2013-09-06 (×2): qty 1

## 2013-09-06 MED ORDER — METRONIDAZOLE IN NACL 5-0.79 MG/ML-% IV SOLN
500.0000 mg | Freq: Three times a day (TID) | INTRAVENOUS | Status: DC
  Administered 2013-09-06 – 2013-09-07 (×2): 500 mg via INTRAVENOUS
  Filled 2013-09-06 (×3): qty 100

## 2013-09-06 MED ORDER — LEVOFLOXACIN IN D5W 750 MG/150ML IV SOLN
750.0000 mg | INTRAVENOUS | Status: DC
  Administered 2013-09-06: 750 mg via INTRAVENOUS
  Filled 2013-09-06: qty 150

## 2013-09-06 MED ORDER — TRACE MINERALS CR-CU-F-FE-I-MN-MO-SE-ZN IV SOLN
INTRAVENOUS | Status: DC
  Administered 2013-09-06: 17:00:00 via INTRAVENOUS
  Filled 2013-09-06: qty 1000

## 2013-09-06 NOTE — Progress Notes (Signed)
ANTIBIOTIC CONSULT NOTE - INITIAL  Pharmacy Consult for Levaquin Indication: Intraabdominal infection  Allergies  Allergen Reactions  . Atenolol Nausea And Vomiting  . Clarithromycin Nausea And Vomiting  . Codeine Sulfate Nausea Only  . Macrodantin Other (See Comments)    Long time ago - reaction unknown  . Percocet [Oxycodone-Acetaminophen] Nausea And Vomiting  . Zosyn [Piperacillin Sod-Tazobactam So] Rash    Morbilliform eruption with itching    Patient Measurements: Height: 5\' 3"  (160 cm) Weight: 183 lb 6.8 oz (83.2 kg) IBW/kg (Calculated) : 52.4 Adjusted Body Weight:   Vital Signs: Temp: 98.7 F (37.1 C) (08/18 1405) Temp src: Oral (08/18 1405) BP: 111/56 mmHg (08/18 1405) Pulse Rate: 76 (08/18 1405) Intake/Output from previous day: 08/17 0701 - 08/18 0700 In: 2132 [P.O.:1040; I.V.:82; IV Piggyback:100; TPN:910] Out: 6606 [Urine:1550] Intake/Output from this shift: Total I/O In: 1525.7 [P.O.:960; I.V.:205.7; TPN:360] Out: 1150 [Urine:1150]  Labs:  Recent Labs  09/04/13 0238 09/05/13 0300 09/06/13 0550  WBC 7.9 7.0 5.7  HGB 10.4* 10.1* 11.1*  PLT 364 383 382  CREATININE 0.69 0.70 0.73   Estimated Creatinine Clearance: 52.5 ml/min (by C-G formula based on Cr of 0.73). No results found for this basename: VANCOTROUGH, VANCOPEAK, VANCORANDOM, GENTTROUGH, GENTPEAK, GENTRANDOM, TOBRATROUGH, TOBRAPEAK, TOBRARND, AMIKACINPEAK, AMIKACINTROU, AMIKACIN,  in the last 72 hours    Medical History: Past Medical History  Diagnosis Date  . Hypertension   . LBP (low back pain)     severe lumbar spondylosis s/p L3/L4,L4/L5 fusion 12/10  . Osteopenia   . B12 deficiency     boderline  . Hypothyroidism   . Chronic constipation   . History of colitis     DX  2003  WITH ISCHEMIC COLITIS  . History of GI bleed     2011--- UPPER SECONARY GASTRIC ULCER FROM NSAID USE  . History of Helicobacter pylori infection     2011  . GERD (gastroesophageal reflux disease)   . H/O  hiatal hernia   . Diverticulosis of colon   . History of ulcer of lower limb     2012   RIGHT LOWER EXTREMITIY--  STATSIS ULCER  . Bilateral edema of lower extremity   . RBBB   . Persistent atrial fibrillation     CARDIOLOGIST-   DR Burman Foster  . Lumbar spondylolysis   . Anticoagulated on Coumadin   . Hyperlipidemia   . Open wound of left lower leg   . Iron deficiency anemia 2011    elevated MCV  . OA (osteoarthritis)     "hands" (08/25/2013)  . Anxiety     Medications:  Prescriptions prior to admission  Medication Sig Dispense Refill  . acetaminophen (TYLENOL) 500 MG tablet Take 500 mg by mouth daily as needed for pain.      . Amino Acids-Protein Hydrolys (FEEDING SUPPLEMENT, PRO-STAT SUGAR FREE 64,) LIQD Take 30 mLs by mouth daily.      [name] atorvastatin (LIPITOR) 10 MG tablet Take 10 mg by mouth every evening.      . B Complex-C (B-COMPLEX WITH VITAMIN C) tablet Take 1 tablet by mouth daily.      . bisacodyl (DULCOLAX) 10 MG suppository Place 10 mg rectally once.      . busPIRone (BUSPAR) 7.5 MG tablet Take 7.5 mg by mouth 2 (two) times daily.      . Cholecalciferol (VITAMIN D3) 1000 UNITS tablet Take 1,000 Units by mouth daily.        [name] diltiazem (DILACOR XR) 120 MG 24  hr capsule Take 120 mg by mouth daily.      . Ferrous Sulfate (IRON) 325 (65 FE) MG TABS Take 1 tablet by mouth daily.      . furosemide (LASIX) 40 MG tablet Take 40 mg by mouth 2 (two) times daily.      [name] levothyroxine (SYNTHROID, LEVOTHROID) 25 MCG tablet Take 25 mcg by mouth daily before breakfast.      . LORazepam (ATIVAN) 0.5 MG tablet Take 0.5 mg by mouth every 8 (eight) hours as needed for anxiety.      . metoprolol succinate (TOPROL-XL) 50 MG 24 hr tablet Take 50 mg by mouth 2 (two) times daily. Take with or immediately following a meal.      . mineral oil enema Place 1 enema rectally daily as needed for mild constipation or severe constipation.      . pantoprazole (PROTONIX) 40 MG tablet Take 1 tablet (40 mg  total) by mouth daily before breakfast.  90 tablet  3  . polyethylene glycol (MIRALAX / GLYCOLAX) packet Take 17 g by mouth daily.      . potassium chloride SA (K-DUR,KLOR-CON) 20 MEQ tablet Take 1 tablet (20 mEq total) by mouth daily.  90 tablet  1  . promethazine (PHENERGAN) 12.5 MG tablet Take 12.5 mg by mouth every 6 (six) hours as needed for nausea or vomiting.      . senna-docusate (SENOKOT-S) 8.6-50 MG per tablet Take 1 tablet by mouth 2 (two) times daily.      . temazepam (RESTORIL) 30 MG capsule Take 30 mg by mouth at bedtime.      . traMADol (ULTRAM) 50 MG tablet Take 50 mg by mouth every 6 (six) hours as needed for moderate pain.      . vitamin C (ASCORBIC ACID) 500 MG tablet Take 500 mg by mouth daily. Take with iron dose      . warfarin (COUMADIN) 2.5 MG tablet Take 2.5-5 mg by mouth daily. Take 2.5mg  Monday, Tuesday, Wednesday, Thursday, Friday and Saturday.  Take 5mg  on Sunday.       Assessment: Diverticulitis + abscess with Enterococcus/Ecoli in culture from JP drain and B-lactamase + bacteroides thetaiotaomicron in L-buttocks abscess culture. Change Zosyn to Levaquin. Afebrile. WBC down to 5.7. CrCl 52.   Plan:  Levaquin 750mg  IV q24h for 7-14d indicated for skin/SQ tissue infection recommended.   Cyle Kenyon S. Alford Highland, PharmD, BCPS Clinical Staff Pharmacist Pager [phone]  Eilene Ghazi Stillinger 09/06/2013,5:39 PM

## 2013-09-06 NOTE — Progress Notes (Signed)
PARENTERAL NUTRITION CONSULT NOTE - FOLLOW UP  Pharmacy Consult for TPN Indication: Diverticulitis s/p drain, inability to advance diet  Allergies  Allergen Reactions  . Atenolol Nausea And Vomiting  . Clarithromycin Nausea And Vomiting  . Codeine Sulfate Nausea Only  . Macrodantin Other (See Comments)    Long time ago - reaction unknown  . Percocet [Oxycodone-Acetaminophen] Nausea And Vomiting    Patient Measurements: Height: 5\' 3"  (160 cm) Weight: 183 lb 6.8 oz (83.2 kg) IBW/kg (Calculated) : 52.4 Adjusted Body Weight: 62.2 kg  Vital Signs: Temp: 98.2 F (36.8 C) (08/18 0608) Temp src: Oral (08/18 0608) BP: 140/76 mmHg (08/18 0608) Pulse Rate: 88 (08/18 0608) Intake/Output from previous day: 08/17 0701 - 08/18 0700 In: 2132 [P.O.:1040; I.V.:82; IV Piggyback:100; TPN:910] Out: 4854 [Urine:1550] Intake/Output from this shift:    Labs:  Recent Labs  09/04/13 0238 09/05/13 0300 09/06/13 0550  WBC 7.9 7.0 5.7  HGB 10.4* 10.1* 11.1*  HCT 31.3* 30.6* 33.1*  PLT 364 383 382  INR 1.21 1.16 1.22     Recent Labs  09/04/13 0238 09/05/13 0300 09/06/13 0550  NA 132* 132* 136*  K 3.9 4.0 3.7  CL 97 97 99  CO2 24 25 26   GLUCOSE 160* 143* 119*  BUN 14 14 12   CREATININE 0.69 0.70 0.73  CALCIUM 8.5 8.6 8.6  MG 1.9 1.6 2.0  PHOS 2.7 3.1  --   PROT 6.3 6.3 6.5  ALBUMIN 2.2* 2.2* 2.2*  AST 29 28 33  ALT 42* 39* 46*  ALKPHOS 86 79 83  BILITOT 0.5 0.4 0.4  PREALBUMIN  --  13.8*  --   TRIG  --  82  --    Estimated Creatinine Clearance: 52.5 ml/min (by C-G formula based on Cr of 0.73).    Recent Labs  09/05/13 1140 09/05/13 1651 09/05/13 2223  GLUCAP 153* 117* 153*    Insulin Requirements in the past 24 hours:  6 units SSI  Current Nutrition:  Clinimix E 5/15 at 74ml/hr + 20% lipid emulsion at 10 ml/hr- providing 48 g protein and 922 kcal daily  Diet orders * 8/16: Full liquid: 25% of breakfast, 60% of lunch, 50% of dinner * 8/17: Full liquid: 40%  of breakfast then transitioned back to NPO after CT report  Nutritional Goals:  1750-1950 kCal, 110-125 grams of protein per day per RD note on 8/12  Admit: Diverticulitis with abscess s/p drainage per IR  GI: Diverticulitis with abscess, drain placed 8/7. CT 8/12 showed resolution of abscess with other small fluid collections noted. The patient was tolerating a full liquid diet and TPN rate was being reduced to help with appetite stimulation. Repeat CT on 8/17 showed small posterior pelvic abscess and stable 2 cm fluid collection in the RLQ - pt made NPO while awaiting IR review for possible perc drain. Upon IR review, no surgical intervention found - CCS transitioned pt to soft diet. Will continue TPN at half rate to help stimulate appetite - likely can wean off on 8/19. Last BM 8/17. Prealbumin improving, 13.8 (8/17) << 8 (8/10)  Endo: Last TSH from June was normal. On po synthroid. CBGs 143-175  Lytes: K 3.7, Mg 2 << 1.6 (s/p MgSO4 2g x 1), Phos 3.1, CorCa 9.9. Na 136 << 132.   Renal: IV fluids- NS at [facility]. UOP 0.8 ml/kg/hr, SCr 0.73- stable  Pulm: 97/RA  Cards: Afib, HTN, dHF. BP/HR wnl. Coumadin on hold and currently anticoagulated with full-dose lovenox 85mg /12h. Also on diltiazem  IV, lopressor IV  Ortho: recent fall 6/15 which led to an open fracture of the lower extremity requiring surgical intervention and wound care with wound VAC.   Hepatobil: ALT mildly elevated, alb 2.2, trigs 82 on 8/17  Neuro/MSK: Restoril, Buspar  ID:  Zosyn total abx D#13 for diverticulitis + abscess with Enterococcus/Ecoli in culture from JP drain and B-lactamase + bacteroides thetaiotaomicron in L-buttocks abscess culture. BC from 8/6 negative. CT still showing abscess. Afebrile, WBC wnl - f/u LOT.   Best Practices: Lovenox  TPN Access: PICC line (8/7)  TPN day#: 9 (start date 8/9)  Plan:  1. Continue Clinimix E 5/15 at 40 ml/hr + lipid 20% emulsion at 5 ml/hr- this provides 48 g protein and 922  kcal daily.  Will continue at half rate to help with appetite stimulation 2. Continue trace elements and daily multivitamin in TPN 3. KCl 10 mEq IV x 2 runs 4. Continue CBGs and SSI as ordered 5. Continue IVF as ordered 6. TPN labs as ordered 7. Follow up tolerance of diet progression, appetite stimulation, ability to stop TPN  Alycia Rossetti, PharmD, BCPS Clinical Pharmacist Pager: [phone] 09/06/2013 8:00 AM

## 2013-09-06 NOTE — Progress Notes (Signed)
Subjective: Diverticular L TG drain in place CT 8/17 shows resolution afeb Wbc wnl Pull per [facility]   Objective: Vital signs in last 24 hours: Temp:  [98.2 F (36.8 C)-98.7 F (37.1 C)] 98.7 F (37.1 C) (08/18 1405) Pulse Rate:  [76-88] 76 (08/18 1405) Resp:  [18-20] 18 (08/18 1405) BP: (111-140)/(56-76) 111/56 mmHg (08/18 1405) SpO2:  [97 %] 97 % (08/18 1405) Last BM Date: 09/05/13  Intake/Output from previous day: 08/17 0701 - 08/18 0700 In: 2132 [P.O.:1040; I.V.:82; IV Piggyback:100; TPN:910] Out: 1550 [Urine:1550] Intake/Output this shift: Total I/O In: 1525.7 [P.O.:960; I.V.:205.7; TPN:360] Out: 1150 [Urine:1150]  PE:  L TG drain pulled at bedside No complication Dressing applied  Lab Results:   Recent Labs  09/05/13 0300 09/06/13 0550  WBC 7.0 5.7  HGB 10.1* 11.1*  HCT 30.6* 33.1*  PLT 383 382   BMET  Recent Labs  09/05/13 0300 09/06/13 0550  NA 132* 136*  K 4.0 3.7  CL 97 99  CO2 25 26  GLUCOSE 143* 119*  BUN 14 12  CREATININE 0.70 0.73  CALCIUM 8.6 8.6   PT/INR  Recent Labs  09/05/13 0300 09/06/13 0550  LABPROT 14.8 15.4*  INR 1.16 1.22   ABG No results found for this basename: PHART, PCO2, PO2, HCO3,  in the last 72 hours  Studies/Results: Dg Tibia/fibula Left  09/05/2013   CLINICAL DATA:  Followup left tibial fracture.  EXAM: LEFT TIBIA AND FIBULA - 2 VIEW  COMPARISON:  06/21/2013  FINDINGS: Intra medullary rod again noted within the left tibia across the distal tibial shaft and metaphyseal fractures. Stable alignment. Evidence of partial healing across the fracture line.  Fractures within the proximal and distal shafts of the left fibula are again noted with stable alignment. No significant healing callus noted.  IMPRESSION: Partial healing within the tibial fractures. Intra medullary nail has a stable appearance.  No significant evidence of healing across the proximal or distal fibular shaft fractures.   Electronically Signed   By:  Rolm Baptise M.D.   On: 09/05/2013 16:24   Ct Abdomen Pelvis W Contrast  09/05/2013   CLINICAL DATA:  Pelvic abscess.  EXAM: CT ABDOMEN AND PELVIS WITH CONTRAST  TECHNIQUE: Multidetector CT imaging of the abdomen and pelvis was performed using the standard protocol following bolus administration of intravenous contrast.  CONTRAST:  173mL OMNIPAQUE IOHEXOL 300 MG/ML  SOLN  COMPARISON:  CT scan of August 31, 2013.  FINDINGS: Status post interbody fusion of L3-4 and L4-5. Minimal bilateral pleural effusions are noted with adjacent subsegmental atelectasis.  No gallstones are noted. Small hiatal hernia is noted. No focal abnormality is noted in the liver or spleen. Stable dilated pancreatic duct is noted in the pancreatic head. Stable pancreatic cystic lesion measuring 21 x 9 mm is noted in the uncinate process ; it is uncertain if this represents anomalous dilated pancreatic duct. MRI may be performed for further evaluation. Adrenal glands and kidneys appear normal. No hydronephrosis or renal obstruction is noted. Atherosclerotic calcifications of abdominal aorta are noted without aneurysm formation. Percutaneous drain is again noted in the posterior portion of the pelvis with associated fluid collection measuring 16 x 16 mm which is smaller compared to prior exam. Sigmoid diverticulosis is noted without inflammation. Another fluid collection measuring 22 x 20 mm is noted in the right lower quadrant adjacent to the terminal ileum. This portion the terminal ileum does demonstrate wall thickening consistent with inflammation. This fluid collection is not significantly changed compared to  prior exam and is concerning for possible abscess. Status post right total hip arthroplasty. Stable bilateral inguinal adenopathy.  IMPRESSION: Stable cystic abnormality seen in the uncinate process of the pancreatic head ; it is uncertain if this represents dilated anomalous pancreatic duct or possibly cystic neoplasm. There does  appear to be dilatation of the pancreatic duct seen more superiorly in the pancreatic head ; further evaluation with MRI is recommended.  The abscess noted in the posterior portion of the pelvis is smaller compared to prior exam. Continued presence of left transgluteal percutaneous drainage catheter is noted.  Stable 2 cm fluid collection is seen in the right lower quadrant adjacent to terminal ileum which appears to be inflamed with associated wall thickening. This fluid collection is concerning for possible abscess which is stable.   Electronically Signed   By: Sabino Dick M.D.   On: 09/05/2013 16:14    Anti-infectives: Anti-infectives   Start     Dose/Rate Route Frequency Ordered Stop   08/28/13 1400  piperacillin-tazobactam (ZOSYN) IVPB 3.375 g     3.375 g 12.5 mL/hr over 240 Minutes Intravenous 3 times per day 08/28/13 1126     08/26/13 0800  ertapenem (INVANZ) 1 g in sodium chloride 0.9 % 50 mL IVPB  Status:  Discontinued     1 g 100 mL/hr over 30 Minutes Intravenous Every 24 hours 08/26/13 0747 08/28/13 1126   08/26/13 0200  ciprofloxacin (CIPRO) IVPB 400 mg  Status:  Discontinued     400 mg 200 mL/hr over 60 Minutes Intravenous Every 12 hours 08/25/13 1646 08/26/13 0747   08/25/13 1645  metroNIDAZOLE (FLAGYL) IVPB 500 mg  Status:  Discontinued     500 mg 100 mL/hr over 60 Minutes Intravenous Every 8 hours 08/25/13 1636 08/26/13 0747   08/25/13 1345  ciprofloxacin (CIPRO) IVPB 400 mg     400 mg 200 mL/hr over 60 Minutes Intravenous  Once 08/25/13 1344 08/25/13 1515   08/25/13 1345  metroNIDAZOLE (FLAGYL) IVPB 500 mg     500 mg 100 mL/hr over 60 Minutes Intravenous  Once 08/25/13 1344 08/25/13 1554      Assessment/Plan: s/p * No surgery found *  TG drain removed per [facility] order Pt tolerated well   LOS: 12 days    Carrel Leather A 09/06/2013

## 2013-09-06 NOTE — Progress Notes (Signed)
NUTRITION FOLLOW UP  Intervention:   TPN wean per pharmacy. Provide Ensure Complete once daily, provides 150 kcal and 13 grams of protein Provide 30 ml of Pro-stat once daily, provides 100 kcal and 15 grams of protein  Nutrition Dx:   Inadequate oral intake related to inability to eat as evidenced by NPO; ongoing   Goal:   Pt to meet >/= 90% of their estimated nutrition needs; met  Monitor:   Weight trends, NPO, TPN, labs, I/O's  Assessment:   80 y.o. female with past medical history of atrial fibrillation on chronic anticoagulation and recent fall last month ago which led to an open fracture of the lower extremity requiring surgical intervention post wound VAC. Patient was in skilled nursing for rehabilitation and started having left lower quadrant abdominal pain the last few days to the point where she could not take it anymore and came into the emergency room.   8/07: - CT scan of the abdomen concerning for infectious ileitis, possible ruptured diverticulitis and peridiverticular abscess.  - Surgery eval recommended IR placement of perc drain.  - Pt reports no recent weight loss and says that she was eating well prior to admission. She drinks one Ensure nutritional supplement per day at home.   8/12: - TPN started 8/8 - Pt also tolerating sips of clear liquid diet and having small bowel movements. No N/V.  - Current TPN is at goal rate:  8/18: Pt's diet was advanced to full liquids on 8/14 and Soft diet today. Per PharmD note, Clinamix E 5/15 was decreased to rate of 40 ml/hr on 8/17 (922 kcal, 48 grams of protein). Per nursing notes pt consumed 50% of breakfast this morning and 100% of lunch. Pt reports eating no breakfast and a small amount of lunch. She reports feeling fine after meal. RD encouraged protein-rich foods and adequate PO intake to promote ongoing healing and improve strength. Pt agreeable to receiving Ensure and Pro-stat.  Per radiology note, "Output from drain  minimal to none x 2-3 days".  Labs reviewed. Low hemoglobin, CBG's 117 to 154 mg/dL  Height: Ht Readings from Last 1 Encounters:  08/25/13 5' 3" (1.6 m)    Weight Status:   Wt Readings from Last 1 Encounters:  09/01/13 183 lb 6.8 oz (83.2 kg)    Re-estimated needs:  Kcal: 1750-1950 Protein: 110-125 g Fluid: Per MD  Skin: wound vac on lower left leg  Diet Order: Criss Rosales   Intake/Output Summary (Last 24 hours) at 09/06/13 1507 Last data filed at 09/06/13 1450  Gross per 24 hour  Intake 2367.67 ml  Output   2150 ml  Net 217.67 ml    Last BM: 8/17   Labs:   Recent Labs Lab 09/03/13 0500 09/04/13 0238 09/05/13 0300 09/06/13 0550  NA 132* 132* 132* 136*  K 4.3 3.9 4.0 3.7  CL 97 97 97 99  CO2 _0 BUN _1 CREATININE 0.64 0.69 0.70 0.73  CALCIUM 8.5 8.5 8.6 8.6  MG 1.8 1.9 1.6 2.0  PHOS 3.1 2.7 3.1  --   GLUCOSE 198* 160* 143* 119*    CBG (last 3)   Recent Labs  09/05/13 2223 09/06/13 0819 09/06/13 1207  GLUCAP 153* 128* 140*    Scheduled Meds: . busPIRone  7.5 mg Oral BID  . diltiazem  30 mg Oral Q12H  . enoxaparin (LOVENOX) injection  85 mg Subcutaneous Q12H  . insulin aspart  0-9 Units Subcutaneous TID WC &  HS  . levothyroxine  25 mcg Oral QAC breakfast  . metoprolol tartrate  25 mg Oral BID  . piperacillin-tazobactam (ZOSYN)  IV  3.375 g Intravenous 3 times per day  . temazepam  30 mg Oral QHS    Continuous Infusions: . sodium chloride 10 mL/hr at 09/05/13 1004  . Marland KitchenTPN (CLINIMIX-E) Adult 40 mL/hr at 09/05/13 1716   And  . fat emulsion 250 mL (09/05/13 1716)  . Marland KitchenTPN (CLINIMIX-E) Adult     And  . fat emulsion      Pryor Ochoa RD, LDN Inpatient Clinical Dietitian Pager: [phone] After Hours Pager: [phone]

## 2013-09-06 NOTE — Progress Notes (Signed)
Subjective: Patient seen for VAC dressing change. She was in CT scan yesterday when we attempted to change.  The wound is healing very well and measures 7.5 x 2.5 cm with no depth except for area medially which is about 4 mm. The wound bed is mostly beefy red granulation. There is no sign of infection.  Acell powder was applied and then adaptic, surgical lubricant and the VAC dressing was re-applied.  They will plan to follow up next Monday in the [facility].   Objective: Vital signs in last 24 hours: Temp:  [98.2 F (36.8 C)-98.7 F (37.1 C)] 98.7 F (37.1 C) (08/18 1405) Pulse Rate:  [76-88] 76 (08/18 1405) Resp:  [18-20] 18 (08/18 1405) BP: (111-140)/(56-76) 111/56 mmHg (08/18 1405) SpO2:  [97 %] 97 % (08/18 1405) Last BM Date: 09/05/13  Intake/Output from previous day: 08/17 0701 - 08/18 0700 In: 2132 [P.O.:1040; I.V.:82; IV Piggyback:100; TPN:910] Out: 2694 [Urine:1550] Intake/Output this shift: Total I/O In: 1525.7 [P.O.:960; I.V.:205.7; TPN:360] Out: 1150 [Urine:1150]   Lab Results:   Recent Labs  09/05/13 0300 09/06/13 0550  WBC 7.0 5.7  HGB 10.1* 11.1*  HCT 30.6* 33.1*  PLT 383 382   BMET  Recent Labs  09/05/13 0300 09/06/13 0550  NA 132* 136*  K 4.0 3.7  CL 97 99  CO2 25 26  GLUCOSE 143* 119*  BUN 14 12  CREATININE 0.70 0.73  CALCIUM 8.6 8.6   PT/INR  Recent Labs  09/05/13 0300 09/06/13 0550  LABPROT 14.8 15.4*  INR 1.16 1.22   ABG No results found for this basename: PHART, PCO2, PO2, HCO3,  in the last 72 hours  Studies/Results: Dg Tibia/fibula Left  09/05/2013   CLINICAL DATA:  Followup left tibial fracture.  EXAM: LEFT TIBIA AND FIBULA - 2 VIEW  COMPARISON:  06/21/2013  FINDINGS: Intra medullary rod again noted within the left tibia across the distal tibial shaft and metaphyseal fractures. Stable alignment. Evidence of partial healing across the fracture line.  Fractures within the proximal and distal shafts of the left  fibula are again noted with stable alignment. No significant healing callus noted.  IMPRESSION: Partial healing within the tibial fractures. Intra medullary nail has a stable appearance.  No significant evidence of healing across the proximal or distal fibular shaft fractures.   Electronically Signed   By: Rolm Baptise M.D.   On: 09/05/2013 16:24   Ct Abdomen Pelvis W Contrast  09/05/2013   CLINICAL DATA:  Pelvic abscess.  EXAM: CT ABDOMEN AND PELVIS WITH CONTRAST  TECHNIQUE: Multidetector CT imaging of the abdomen and pelvis was performed using the standard protocol following bolus administration of intravenous contrast.  CONTRAST:  161mL OMNIPAQUE IOHEXOL 300 MG/ML  SOLN  COMPARISON:  CT scan of August 31, 2013.  FINDINGS: Status post interbody fusion of L3-4 and L4-5. Minimal bilateral pleural effusions are noted with adjacent subsegmental atelectasis.  No gallstones are noted. Small hiatal hernia is noted. No focal abnormality is noted in the liver or spleen. Stable dilated pancreatic duct is noted in the pancreatic head. Stable pancreatic cystic lesion measuring 21 x 9 mm is noted in the uncinate process ; it is uncertain if this represents anomalous dilated pancreatic duct. MRI may be performed for further evaluation. Adrenal glands and kidneys appear normal. No hydronephrosis or renal obstruction is noted. Atherosclerotic calcifications of abdominal aorta are noted without aneurysm formation. Percutaneous drain is again noted in the posterior portion of the pelvis with associated fluid collection measuring  16 x 16 mm which is smaller compared to prior exam. Sigmoid diverticulosis is noted without inflammation. Another fluid collection measuring 22 x 20 mm is noted in the right lower quadrant adjacent to the terminal ileum. This portion the terminal ileum does demonstrate wall thickening consistent with inflammation. This fluid collection is not significantly changed compared to prior exam and is concerning  for possible abscess. Status post right total hip arthroplasty. Stable bilateral inguinal adenopathy.  IMPRESSION: Stable cystic abnormality seen in the uncinate process of the pancreatic head ; it is uncertain if this represents dilated anomalous pancreatic duct or possibly cystic neoplasm. There does appear to be dilatation of the pancreatic duct seen more superiorly in the pancreatic head ; further evaluation with MRI is recommended.  The abscess noted in the posterior portion of the pelvis is smaller compared to prior exam. Continued presence of left transgluteal percutaneous drainage catheter is noted.  Stable 2 cm fluid collection is seen in the right lower quadrant adjacent to terminal ileum which appears to be inflamed with associated wall thickening. This fluid collection is concerning for possible abscess which is stable.   Electronically Signed   By: Sabino Dick M.D.   On: 09/05/2013 16:14    Anti-infectives: Anti-infectives   Start     Dose/Rate Route Frequency Ordered Stop   08/28/13 1400  piperacillin-tazobactam (ZOSYN) IVPB 3.375 g     3.375 g 12.5 mL/hr over 240 Minutes Intravenous 3 times per day 08/28/13 1126     08/26/13 0800  ertapenem (INVANZ) 1 g in sodium chloride 0.9 % 50 mL IVPB  Status:  Discontinued     1 g 100 mL/hr over 30 Minutes Intravenous Every 24 hours 08/26/13 0747 08/28/13 1126   08/26/13 0200  ciprofloxacin (CIPRO) IVPB 400 mg  Status:  Discontinued     400 mg 200 mL/hr over 60 Minutes Intravenous Every 12 hours 08/25/13 1646 08/26/13 0747   08/25/13 1645  metroNIDAZOLE (FLAGYL) IVPB 500 mg  Status:  Discontinued     500 mg 100 mL/hr over 60 Minutes Intravenous Every 8 hours 08/25/13 1636 08/26/13 0747   08/25/13 1345  ciprofloxacin (CIPRO) IVPB 400 mg     400 mg 200 mL/hr over 60 Minutes Intravenous  Once 08/25/13 1344 08/25/13 1515   08/25/13 1345  metroNIDAZOLE (FLAGYL) IVPB 500 mg     500 mg 100 mL/hr over 60 Minutes Intravenous  Once 08/25/13 1344  08/25/13 1554      Assessment/Plan: s/p * No surgery found * Follow up in the [facility] next Monday with Dr. Migdalia Dk [phone])  LOS: 12 days    RAYBURN,SHAWN,PA-C Plastic Surgery [phone]

## 2013-09-06 NOTE — Consult Note (Signed)
WOC has signed off.  Plastics following 1x wk for wound care for the LE wound.   Re consult if needed, will not follow at this time. Thanks  Graciella Arment [place], [place] [phone])

## 2013-09-06 NOTE — Progress Notes (Signed)
Patient feeling better. Tolerating soft diet + BM Minimal drain output Will d/c drain Continue abx for the other 2 cm fluid collection - unable to aspirate or drain.  Imogene Burn. Georgette Dover, MD, [facility] Surgery  General/ Trauma Surgery  09/06/2013 3:25 PM

## 2013-09-06 NOTE — Progress Notes (Signed)
Subjective: L TG diverticular abscess drain placed 8/7 CT 8/17 reveals resolved abscess at area of drain catheter Small collection (2cm) at RLQ- stable Pt better daily  Objective: Vital signs in last 24 hours: Temp:  [98.2 F (36.8 C)-98.5 F (36.9 C)] 98.2 F (36.8 C) (08/18 7425) Pulse Rate:  [88-96] 88 (08/18 0608) Resp:  [18-20] 20 (08/18 9563) BP: (122-140)/(66-76) 140/76 mmHg (08/18 0608) SpO2:  [97 %-100 %] 97 % (08/18 0608) Last BM Date: 09/05/13  Intake/Output from previous day: 08/17 0701 - 08/18 0700 In: 2132 [P.O.:1040; I.V.:82; IV Piggyback:100; TPN:910] Out: 8756 [Urine:1550] Intake/Output this shift: Total I/O In: 480 [P.O.:480] Out: 950 [Urine:950]  PE:  Afeb; vss Output from drain minimal to none x 2-3 days Site clean and dry Rash over buttocks  Lab Results:   Recent Labs  09/05/13 0300 09/06/13 0550  WBC 7.0 5.7  HGB 10.1* 11.1*  HCT 30.6* 33.1*  PLT 383 382   BMET  Recent Labs  09/05/13 0300 09/06/13 0550  NA 132* 136*  K 4.0 3.7  CL 97 99  CO2 25 26  GLUCOSE 143* 119*  BUN 14 12  CREATININE 0.70 0.73  CALCIUM 8.6 8.6   PT/INR  Recent Labs  09/05/13 0300 09/06/13 0550  LABPROT 14.8 15.4*  INR 1.16 1.22   ABG No results found for this basename: PHART, PCO2, PO2, HCO3,  in the last 72 hours  Studies/Results: Dg Tibia/fibula Left  09/05/2013   CLINICAL DATA:  Followup left tibial fracture.  EXAM: LEFT TIBIA AND FIBULA - 2 VIEW  COMPARISON:  06/21/2013  FINDINGS: Intra medullary rod again noted within the left tibia across the distal tibial shaft and metaphyseal fractures. Stable alignment. Evidence of partial healing across the fracture line.  Fractures within the proximal and distal shafts of the left fibula are again noted with stable alignment. No significant healing callus noted.  IMPRESSION: Partial healing within the tibial fractures. Intra medullary nail has a stable appearance.  No significant evidence of healing  across the proximal or distal fibular shaft fractures.   Electronically Signed   By: Rolm Baptise M.D.   On: 09/05/2013 16:24   Ct Abdomen Pelvis W Contrast  09/05/2013   CLINICAL DATA:  Pelvic abscess.  EXAM: CT ABDOMEN AND PELVIS WITH CONTRAST  TECHNIQUE: Multidetector CT imaging of the abdomen and pelvis was performed using the standard protocol following bolus administration of intravenous contrast.  CONTRAST:  135mL OMNIPAQUE IOHEXOL 300 MG/ML  SOLN  COMPARISON:  CT scan of August 31, 2013.  FINDINGS: Status post interbody fusion of L3-4 and L4-5. Minimal bilateral pleural effusions are noted with adjacent subsegmental atelectasis.  No gallstones are noted. Small hiatal hernia is noted. No focal abnormality is noted in the liver or spleen. Stable dilated pancreatic duct is noted in the pancreatic head. Stable pancreatic cystic lesion measuring 21 x 9 mm is noted in the uncinate process ; it is uncertain if this represents anomalous dilated pancreatic duct. MRI may be performed for further evaluation. Adrenal glands and kidneys appear normal. No hydronephrosis or renal obstruction is noted. Atherosclerotic calcifications of abdominal aorta are noted without aneurysm formation. Percutaneous drain is again noted in the posterior portion of the pelvis with associated fluid collection measuring 16 x 16 mm which is smaller compared to prior exam. Sigmoid diverticulosis is noted without inflammation. Another fluid collection measuring 22 x 20 mm is noted in the right lower quadrant adjacent to the terminal ileum. This portion the terminal ileum  does demonstrate wall thickening consistent with inflammation. This fluid collection is not significantly changed compared to prior exam and is concerning for possible abscess. Status post right total hip arthroplasty. Stable bilateral inguinal adenopathy.  IMPRESSION: Stable cystic abnormality seen in the uncinate process of the pancreatic head ; it is uncertain if this  represents dilated anomalous pancreatic duct or possibly cystic neoplasm. There does appear to be dilatation of the pancreatic duct seen more superiorly in the pancreatic head ; further evaluation with MRI is recommended.  The abscess noted in the posterior portion of the pelvis is smaller compared to prior exam. Continued presence of left transgluteal percutaneous drainage catheter is noted.  Stable 2 cm fluid collection is seen in the right lower quadrant adjacent to terminal ileum which appears to be inflamed with associated wall thickening. This fluid collection is concerning for possible abscess which is stable.   Electronically Signed   By: Sabino Dick M.D.   On: 09/05/2013 16:14    Anti-infectives: Anti-infectives   Start     Dose/Rate Route Frequency Ordered Stop   08/28/13 1400  piperacillin-tazobactam (ZOSYN) IVPB 3.375 g     3.375 g 12.5 mL/hr over 240 Minutes Intravenous 3 times per day 08/28/13 1126     08/26/13 0800  ertapenem (INVANZ) 1 g in sodium chloride 0.9 % 50 mL IVPB  Status:  Discontinued     1 g 100 mL/hr over 30 Minutes Intravenous Every 24 hours 08/26/13 0747 08/28/13 1126   08/26/13 0200  ciprofloxacin (CIPRO) IVPB 400 mg  Status:  Discontinued     400 mg 200 mL/hr over 60 Minutes Intravenous Every 12 hours 08/25/13 1646 08/26/13 0747   08/25/13 1645  metroNIDAZOLE (FLAGYL) IVPB 500 mg  Status:  Discontinued     500 mg 100 mL/hr over 60 Minutes Intravenous Every 8 hours 08/25/13 1636 08/26/13 0747   08/25/13 1345  ciprofloxacin (CIPRO) IVPB 400 mg     400 mg 200 mL/hr over 60 Minutes Intravenous  Once 08/25/13 1344 08/25/13 1515   08/25/13 1345  metroNIDAZOLE (FLAGYL) IVPB 500 mg     500 mg 100 mL/hr over 60 Minutes Intravenous  Once 08/25/13 1344 08/25/13 1554      Assessment/Plan: s/p * No surgery found *  L TG drain intact CT shows resolution of abscess at cath site Small 2 cm RLQ abscess visible on CT--not amenable to percutaneous asp or drain per Dr  Barbie Banner Pull existing drain? Called to [facility]---plan per [facility]   LOS: 12 days    Sherol Sabas A 09/06/2013

## 2013-09-06 NOTE — Progress Notes (Signed)
Patient ID: [name], female   DOB: 05/06/1925, 80 y.o.   MRN: [id number]     [facility]      [address]   [place], [place] [address]    Phone: [phone] FAX: [phone]     Subjective: No pain. Having BMs.  Feels weak.    Objective:  Vital signs:  Filed Vitals:   09/05/13 1044 09/05/13 1130 09/05/13 1415 09/06/13 0608  BP: 137/75 127/68 122/66 140/76  Pulse: 98 95 96 88  Temp:  98.3 F (36.8 C) 98.5 F (36.9 C) 98.2 F (36.8 C)  TempSrc:  Oral Oral Oral  Resp:  20 18 20  Height:      Weight:      SpO2:  95% 100% 97%    Last BM Date: 09/05/13  Intake/Output   Yesterday:  08/17 0701 - 08/18 0700 In: 2132 [P.O.:1040; I.V.:82; IV Piggyback:100; TPN:910] Out: 1550 [Urine:1550] This shift: I/O last 3 completed shifts: In: 3387.7 [P.O.:1240; I.V.:197.7; IV Piggyback:200] Out: 3100 [Urine:3100]    Physical Exam:  General: Pt awake/alert/oriented x4 in no acute distress  Chest: cta. No chest wall pain w good excursion  CV: S1S2 regularly irregular  Abdomen: Soft. Nondistended. Non tender.  No evidence of peritonitis. No output from drain.  No incarcerated hernias. Left gluteal drain with serous output(0ml recorded yesterday).  Ext: LLE dressing, boot. No mjr edema. No cyanosis     Problem List:   Principal Problem:   Sepsis Active Problems:   HYPOTHYROIDISM   Iron deficiency anemia, unspecified   HYPERTENSION   Atrial flutter   Unspecified constipation   Metabolic acidosis   Diverticulitis of colon with perforation    Results:   Labs: Results for orders placed during the hospital encounter of 08/25/13 (from the past 48 hour(s))  GLUCOSE, CAPILLARY     Status: Abnormal   Collection Time    09/04/13 12:01 PM      Result Value Ref Range   Glucose-Capillary 143 (*) 70 - 99 mg/dL  GLUCOSE, CAPILLARY     Status: Abnormal   Collection Time    09/04/13  5:56 PM      Result Value Ref Range    Glucose-Capillary 144 (*) 70 - 99 mg/dL  GLUCOSE, CAPILLARY     Status: Abnormal   Collection Time    09/04/13  8:21 PM      Result Value Ref Range   Glucose-Capillary 175 (*) 70 - 99 mg/dL  GLUCOSE, CAPILLARY     Status: Abnormal   Collection Time    09/05/13 12:36 AM      Result Value Ref Range   Glucose-Capillary 122 (*) 70 - 99 mg/dL  PROTIME-INR     Status: None   Collection Time    09/05/13  3:00 AM      Result Value Ref Range   Prothrombin Time 14.8  11.6 - 15.2 seconds   INR 1.16  0.00 - 1.49  PREALBUMIN     Status: Abnormal   Collection Time    09/05/13  3:00 AM      Result Value Ref Range   Prealbumin 13.8 (*) 17.0 - 34.0 mg/dL   Comment: Performed at [facility]  CBC WITH DIFFERENTIAL     Status: Abnormal   Collection Time    09/05/13  3:00 AM      Result Value Ref Range   WBC 7.0  4.0 - 10.5 K/uL   RBC 2.99 (*)   3.87 - 5.11 MIL/uL   Hemoglobin 10.1 (*) 12.0 - 15.0 g/dL   HCT 30.6 (*) 36.0 - 46.0 %   MCV 102.3 (*) 78.0 - 100.0 fL   MCH 33.8  26.0 - 34.0 pg   MCHC 33.0  30.0 - 36.0 g/dL   RDW 12.7  11.5 - 15.5 %   Platelets 383  150 - 400 K/uL   Neutrophils Relative % 68  43 - 77 %   Neutro Abs 4.9  1.7 - 7.7 K/uL   Lymphocytes Relative 15  12 - 46 %   Lymphs Abs 1.0  0.7 - 4.0 K/uL   Monocytes Relative 12  3 - 12 %   Monocytes Absolute 0.8  0.1 - 1.0 K/uL   Eosinophils Relative 4  0 - 5 %   Eosinophils Absolute 0.3  0.0 - 0.7 K/uL   Basophils Relative 1  0 - 1 %   Basophils Absolute 0.1  0.0 - 0.1 K/uL  COMPREHENSIVE METABOLIC PANEL     Status: Abnormal   Collection Time    09/05/13  3:00 AM      Result Value Ref Range   Sodium 132 (*) 137 - 147 mEq/L   Potassium 4.0  3.7 - 5.3 mEq/L   Chloride 97  96 - 112 mEq/L   CO2 25  19 - 32 mEq/L   Glucose, Bld 143 (*) 70 - 99 mg/dL   BUN 14  6 - 23 mg/dL   Creatinine, Ser 0.70  0.50 - 1.10 mg/dL   Calcium 8.6  8.4 - 10.5 mg/dL   Total Protein 6.3  6.0 - 8.3 g/dL   Albumin 2.2 (*) 3.5 - 5.2 g/dL    AST 28  0 - 37 U/L   ALT 39 (*) 0 - 35 U/L   Alkaline Phosphatase 79  39 - 117 U/L   Total Bilirubin 0.4  0.3 - 1.2 mg/dL   GFR calc non Af Amer 77 (*) >90 mL/min   GFR calc Af Amer 89 (*) >90 mL/min   Comment: (NOTE)     The eGFR has been calculated using the CKD EPI equation.     This calculation has not been validated in all clinical situations.     eGFR's persistently <90 mL/min signify possible Chronic Kidney     Disease.   Anion gap 10  5 - 15  MAGNESIUM     Status: None   Collection Time    09/05/13  3:00 AM      Result Value Ref Range   Magnesium 1.6  1.5 - 2.5 mg/dL  PHOSPHORUS     Status: None   Collection Time    09/05/13  3:00 AM      Result Value Ref Range   Phosphorus 3.1  2.3 - 4.6 mg/dL  TRIGLYCERIDES     Status: None   Collection Time    09/05/13  3:00 AM      Result Value Ref Range   Triglycerides 82  <150 mg/dL  GLUCOSE, CAPILLARY     Status: Abnormal   Collection Time    09/05/13  3:32 AM      Result Value Ref Range   Glucose-Capillary 154 (*) 70 - 99 mg/dL  GLUCOSE, CAPILLARY     Status: Abnormal   Collection Time    09/05/13  7:31 AM      Result Value Ref Range   Glucose-Capillary 152 (*) 70 - 99 mg/dL  GLUCOSE, CAPILLARY  Status: Abnormal   Collection Time    09/05/13 11:40 AM      Result Value Ref Range   Glucose-Capillary 153 (*) 70 - 99 mg/dL  GLUCOSE, CAPILLARY     Status: Abnormal   Collection Time    09/05/13  4:51 PM      Result Value Ref Range   Glucose-Capillary 117 (*) 70 - 99 mg/dL  GLUCOSE, CAPILLARY     Status: Abnormal   Collection Time    09/05/13 10:23 PM      Result Value Ref Range   Glucose-Capillary 153 (*) 70 - 99 mg/dL   Comment 1 Notify RN    PROTIME-INR     Status: Abnormal   Collection Time    09/06/13  5:50 AM      Result Value Ref Range   Prothrombin Time 15.4 (*) 11.6 - 15.2 seconds   INR 1.22  0.00 - 1.49  CBC WITH DIFFERENTIAL     Status: Abnormal   Collection Time    09/06/13  5:50 AM      Result  Value Ref Range   WBC 5.7  4.0 - 10.5 K/uL   RBC 3.21 (*) 3.87 - 5.11 MIL/uL   Hemoglobin 11.1 (*) 12.0 - 15.0 g/dL   HCT 33.1 (*) 36.0 - 46.0 %   MCV 103.1 (*) 78.0 - 100.0 fL   MCH 34.6 (*) 26.0 - 34.0 pg   MCHC 33.5  30.0 - 36.0 g/dL   RDW 12.7  11.5 - 15.5 %   Platelets 382  150 - 400 K/uL   Neutrophils Relative % 61  43 - 77 %   Neutro Abs 3.5  1.7 - 7.7 K/uL   Lymphocytes Relative 18  12 - 46 %   Lymphs Abs 1.0  0.7 - 4.0 K/uL   Monocytes Relative 15 (*) 3 - 12 %   Monocytes Absolute 0.9  0.1 - 1.0 K/uL   Eosinophils Relative 5  0 - 5 %   Eosinophils Absolute 0.3  0.0 - 0.7 K/uL   Basophils Relative 1  0 - 1 %   Basophils Absolute 0.1  0.0 - 0.1 K/uL  COMPREHENSIVE METABOLIC PANEL     Status: Abnormal   Collection Time    09/06/13  5:50 AM      Result Value Ref Range   Sodium 136 (*) 137 - 147 mEq/L   Potassium 3.7  3.7 - 5.3 mEq/L   Chloride 99  96 - 112 mEq/L   CO2 26  19 - 32 mEq/L   Glucose, Bld 119 (*) 70 - 99 mg/dL   BUN 12  6 - 23 mg/dL   Creatinine, Ser 0.73  0.50 - 1.10 mg/dL   Calcium 8.6  8.4 - 10.5 mg/dL   Total Protein 6.5  6.0 - 8.3 g/dL   Albumin 2.2 (*) 3.5 - 5.2 g/dL   AST 33  0 - 37 U/L   ALT 46 (*) 0 - 35 U/L   Alkaline Phosphatase 83  39 - 117 U/L   Total Bilirubin 0.4  0.3 - 1.2 mg/dL   GFR calc non Af Amer 76 (*) >90 mL/min   GFR calc Af Amer 88 (*) >90 mL/min   Comment: (NOTE)     The eGFR has been calculated using the CKD EPI equation.     This calculation has not been validated in all clinical situations.     eGFR's persistently <90 mL/min signify possible Chronic Kidney  Disease.   Anion gap 11  5 - 15  MAGNESIUM     Status: None   Collection Time    09/06/13  5:50 AM      Result Value Ref Range   Magnesium 2.0  1.5 - 2.5 mg/dL    Imaging / Studies: Dg Tibia/fibula Left  09/05/2013   CLINICAL DATA:  Followup left tibial fracture.  EXAM: LEFT TIBIA AND FIBULA - 2 VIEW  COMPARISON:  06/21/2013  FINDINGS: Intra medullary rod again  noted within the left tibia across the distal tibial shaft and metaphyseal fractures. Stable alignment. Evidence of partial healing across the fracture line.  Fractures within the proximal and distal shafts of the left fibula are again noted with stable alignment. No significant healing callus noted.  IMPRESSION: Partial healing within the tibial fractures. Intra medullary nail has a stable appearance.  No significant evidence of healing across the proximal or distal fibular shaft fractures.   Electronically Signed   By: Kevin  Dover M.D.   On: 09/05/2013 16:24   Ct Abdomen Pelvis W Contrast  09/05/2013   CLINICAL DATA:  Pelvic abscess.  EXAM: CT ABDOMEN AND PELVIS WITH CONTRAST  TECHNIQUE: Multidetector CT imaging of the abdomen and pelvis was performed using the standard protocol following bolus administration of intravenous contrast.  CONTRAST:  100mL OMNIPAQUE IOHEXOL 300 MG/ML  SOLN  COMPARISON:  CT scan of August 31, 2013.  FINDINGS: Status post interbody fusion of L3-4 and L4-5. Minimal bilateral pleural effusions are noted with adjacent subsegmental atelectasis.  No gallstones are noted. Small hiatal hernia is noted. No focal abnormality is noted in the liver or spleen. Stable dilated pancreatic duct is noted in the pancreatic head. Stable pancreatic cystic lesion measuring 21 x 9 mm is noted in the uncinate process ; it is uncertain if this represents anomalous dilated pancreatic duct. MRI may be performed for further evaluation. Adrenal glands and kidneys appear normal. No hydronephrosis or renal obstruction is noted. Atherosclerotic calcifications of abdominal aorta are noted without aneurysm formation. Percutaneous drain is again noted in the posterior portion of the pelvis with associated fluid collection measuring 16 x 16 mm which is smaller compared to prior exam. Sigmoid diverticulosis is noted without inflammation. Another fluid collection measuring 22 x 20 mm is noted in the right lower quadrant  adjacent to the terminal ileum. This portion the terminal ileum does demonstrate wall thickening consistent with inflammation. This fluid collection is not significantly changed compared to prior exam and is concerning for possible abscess. Status post right total hip arthroplasty. Stable bilateral inguinal adenopathy.  IMPRESSION: Stable cystic abnormality seen in the uncinate process of the pancreatic head ; it is uncertain if this represents dilated anomalous pancreatic duct or possibly cystic neoplasm. There does appear to be dilatation of the pancreatic duct seen more superiorly in the pancreatic head ; further evaluation with MRI is recommended.  The abscess noted in the posterior portion of the pelvis is smaller compared to prior exam. Continued presence of left transgluteal percutaneous drainage catheter is noted.  Stable 2 cm fluid collection is seen in the right lower quadrant adjacent to terminal ileum which appears to be inflamed with associated wall thickening. This fluid collection is concerning for possible abscess which is stable.   Electronically Signed   By: James  Green M.D.   On: 09/05/2013 16:14    Medications / Allergies:  Scheduled Meds: . busPIRone  7.5 mg Oral BID  . diltiazem  30 mg Oral Q12H  .   enoxaparin (LOVENOX) injection  85 mg Subcutaneous Q12H  . insulin aspart  0-9 Units Subcutaneous TID WC & HS  . levothyroxine  25 mcg Oral QAC breakfast  . metoprolol tartrate  25 mg Oral BID  . piperacillin-tazobactam (ZOSYN)  IV  3.375 g Intravenous 3 times per day  . temazepam  30 mg Oral QHS   Continuous Infusions: . sodium chloride 10 mL/hr at 09/05/13 1004  . .TPN (CLINIMIX-E) Adult 40 mL/hr at 09/05/13 1716   And  . fat emulsion 250 mL (09/05/13 1716)   PRN Meds:.acetaminophen, acetaminophen, LORazepam, morphine injection, ondansetron (ZOFRAN) IV, ondansetron, promethazine, sodium chloride  Antibiotics: Anti-infectives   Start     Dose/Rate Route Frequency Ordered  Stop   08/28/13 1400  piperacillin-tazobactam (ZOSYN) IVPB 3.375 g     3.375 g 12.5 mL/hr over 240 Minutes Intravenous 3 times per day 08/28/13 1126     08/26/13 0800  ertapenem (INVANZ) 1 g in sodium chloride 0.9 % 50 mL IVPB  Status:  Discontinued     1 g 100 mL/hr over 30 Minutes Intravenous Every 24 hours 08/26/13 0747 08/28/13 1126   08/26/13 0200  ciprofloxacin (CIPRO) IVPB 400 mg  Status:  Discontinued     400 mg 200 mL/hr over 60 Minutes Intravenous Every 12 hours 08/25/13 1646 08/26/13 0747   08/25/13 1645  metroNIDAZOLE (FLAGYL) IVPB 500 mg  Status:  Discontinued     500 mg 100 mL/hr over 60 Minutes Intravenous Every 8 hours 08/25/13 1636 08/26/13 0747   08/25/13 1345  ciprofloxacin (CIPRO) IVPB 400 mg     400 mg 200 mL/hr over 60 Minutes Intravenous  Once 08/25/13 1344 08/25/13 1515   08/25/13 1345  metroNIDAZOLE (FLAGYL) IVPB 500 mg     500 mg 100 mL/hr over 60 Minutes Intravenous  Once 08/25/13 1344 08/25/13 1554        Assessment/Plan (Hospital problems)  Atrial flutter-heparin/cardizem  HTN  Hypothyroidism  IDA  Chronic diastolic heart failure   (Principal problem)  sepsis  Abdominal pain, RLQ inflammatory process  Diverticulitis with abscess  -s/p IR drain placement 8/7  -continue drain for now -Day 1 cipro/flagyl Day 2-4 Invanz, then Zosyn D#9 -CT of abdomen and pelvis 8/17 showed small posterior pelvis abscess, stable 2cm fluid collection in RLQ. IR to review if able to perc drain, although it is rather small and the patient clinically appears improved.  Will make her NPO for now.  If no further intervention, advance to a soft diet -TPN, consider weaning -mobilize    , ANP-BC [facility] Pager [phone](7A-4:30P) For consults and floor pages call [phone](7A-4:30P)  09/06/2013 8:29 AM    

## 2013-09-06 NOTE — Progress Notes (Addendum)
[facility] PROGRESS NOTE  [name] PJK:[id number] DOB: 1925-04-13 DOA: 08/25/2013 PCP: Walker Kehr, MD  Brief Summary  80 y.o. female w/ a past medical history of atrial fibrillation on chronic anticoagulation and recent fall which led to an open fracture of the left leg tibia and fibula requiring surgical intervention and wound care with wound VAC. Patient was at [facility] when she developed LLQ pain.  CT scan of the abdomen showed inflammatory changes in the pelvis and right lower quadrant with wall thickening of small bowel adjacent loops concerning for ruptured diverticulitis and abscess. General Surgery was consulted and she was started on IV Cipro and Flagyl.  She had a drain placed transgluteally to drain a pelvic abscess by IR which was removed on 8/18.  Her diet was limited to full liquids with TNA but she is advancing her diet today.  Her abscess culture grew three bacteria.  For the last week, she has been on zosyn, but she has developed a drug rash so changing to levo/flagyl at recommendation of Dr. Megan Salon.    Her medical course has been complicated by difficult to control RVR in the setting of chronic afib/ a flutter with variable conduction, as well as numerous electrolyte disturbances.  Her rate control has now been much more consistent, and she has been titrated back to her usual oral meds.  Plan to d/c back to [facility] SNF once off TPN, hopefully in the next few days.  Significant Events:  8/7 - transgluteal percutaneous drainage in IR  8/7 - PICC line placement  8/8 - begin TNA  8/18 - TNA taper, diet advanced, buttock drain removed  Assessment/Plan  Diverticulitis of colon with perforation s/p L transgluteal percutaneous drainage in IR 8/7  -  Left transgluteal drain removed today -  Small 2cm abscess identified on CT which is not amenable to drainage -  Diet advanced to soft -  Weaning TPN -  Abx adjust tonight due to drug rash, likely due to  zosyn.  Discussed case with Dr. Megan Salon who stated VRE has been treated for 10 days and is less pathogenic than other organisms cultured so okay to stop VRE treatment.  Recommends combination levofloxacin and flagyl for ongoing treatment which should be effective against both the beta-lactamase Bacteriodes AND the E. Coli.  - Abscess cx: pansensitive enterococcus + pan sensitive E coli + beta-lactamase positive Bacteriodes  Morbilliform drug rash likely due to zosyn -  D/c zosyn >> added as drug allergy -  Benadryl  Sepsis (infection, WBC 25, HR 112), remained hemodynamically stable. -  Sepsis physiology resolved  UTI  -  Has completed a full course of tx for this issue - appears that urine was not sent for cx   Metabolic acidosis  -  Resolved w/ volume resuscitation   Hypokalemia and Hypomagnesemia -  Being managed by pharmacy with TPN  Chronic afib and atrial flutter w/ intermittent RVR, rate controlled -  Continue diltiazem and metoprolol -  Continue full dose lovenox for anticoag   Chronic diastolic CHF EF 80% on echogram in 2012 - currently euvolemic   HTN, stable on dilt and BB  Hypothyroid, stable.  Continue synthroid dose.  Hx of Fe deficient anemia  -  Fe studies most c/w "anemia of chronic disease" i.e. likely a result of inflammatory state and recent poor nutrition   Macrocytosis, B12 normal/high June 2014 - recheck notes adequate stores (658)   Mechanical fall with left open tibial fracture June 2015,  XR demonstrates partial healing within tibial fracture and stable IM nail Cont current prescribed wound care (followed by Dr. Migdalia Dk)  - wound vac evaluated by Plastics today -  F/u with wound center on Monday  Obesity Unspecified   Diet:  soft Access:  PIV and PICC IVF:  TPN Proph:  lovenox  Code Status: DNR Family Communication: patient alone Disposition Plan: To [facility] SNF once off TPN, tolerating oral abx, likely in next few days   Consultants:   Gen Surgery  IR  Antibiotics:  Cipro 8/06  Flagyl 8/06  Ertapenem 8/07 >>8/9  Zosyn 8/9 >> 8/18 Levo 8/18 >> Flagyl 8/18 >>  DVT prophylaxis:  lovenox    HPI/Subjective:  Tolerated a few bites of lunch without nausea or abdominal pain.  Feels weak.  Itchy on neck and chest and upper arms.    Objective: Filed Vitals:   09/05/13 1130 09/05/13 1415 09/06/13 0608 09/06/13 1405  BP: 127/68 122/66 140/76 111/56  Pulse: 95 96 88 76  Temp: 98.3 F (36.8 C) 98.5 F (36.9 C) 98.2 F (36.8 C) 98.7 F (37.1 C)  TempSrc: Oral Oral Oral Oral  Resp: 20 18 20 18   Height:      Weight:      SpO2: 95% 100% 97% 97%    Intake/Output Summary (Last 24 hours) at 09/06/13 1539 Last data filed at 09/06/13 1450  Gross per 24 hour  Intake 2607.67 ml  Output   2150 ml  Net 457.67 ml   Filed Weights   08/25/13 1800 08/29/13 0331 09/01/13 1100  Weight: 84.9 kg (187 lb 2.7 oz) 90.2 kg (198 lb 13.7 oz) 83.2 kg (183 lb 6.8 oz)    Exam:   General:  WF, No acute distress  HEENT:  NCAT, MMM  Cardiovascular:  IRRR, nl S1, S2 no mrg, 2+ pulses, warm extremities  Respiratory:  Rales that clear with repeat respirations at bases, no wheezes or rhonchi, no increased WOB  Abdomen:   NABS, soft, NT/ND  MSK:   Normal tone and bulk, wound vac in place with brace on LLE, serosanguinous drainage.  Did not remove dressing on leg.  Dressing on buttock c/d/i (drain removed)  Neuro:  Grossly intact  Skin:  Patient scratching at morbilliform rash most prominent on back and chest but also present on shoulders/upper arms and abdomen.  Also has separate erythematous papular rash under breasts  Data Reviewed: Basic Metabolic Panel:  Recent Labs Lab 09/01/13 0500  09/02/13 0830 09/03/13 0500 09/04/13 0238 09/05/13 0300 09/06/13 0550  NA  --   < > 134* 132* 132* 132* 136*  K  --   < > 3.7 4.3 3.9 4.0 3.7  CL  --   < > 98 97 97 97 99  CO2  --   < > 26 24 24 25 26   GLUCOSE  --   < > 151* 198*  160* 143* 119*  BUN  --   < > 13 13 14 14 12   CREATININE  --   < > 0.60 0.64 0.69 0.70 0.73  CALCIUM  --   < > 8.2* 8.5 8.5 8.6 8.6  MG  --   < > 1.7 1.8 1.9 1.6 2.0  PHOS 2.8  --   --  3.1 2.7 3.1  --   < > = values in this interval not displayed. Liver Function Tests:  Recent Labs Lab 09/02/13 0830 09/03/13 0500 09/04/13 0238 09/05/13 0300 09/06/13 0550  AST 30 30 29  28  33  ALT 37* 39* 42* 39* 46*  ALKPHOS 84 87 86 79 83  BILITOT 0.6 0.6 0.5 0.4 0.4  PROT 6.1 6.1 6.3 6.3 6.5  ALBUMIN 2.2* 2.1* 2.2* 2.2* 2.2*   No results found for this basename: LIPASE, AMYLASE,  in the last 168 hours No results found for this basename: AMMONIA,  in the last 168 hours CBC:  Recent Labs Lab 09/02/13 0830 09/03/13 0500 09/04/13 0238 09/05/13 0300 09/06/13 0550  WBC 11.9* 10.0 7.9 7.0 5.7  NEUTROABS 9.8* 7.9* 5.9 4.9 3.5  HGB 10.6* 10.5* 10.4* 10.1* 11.1*  HCT 32.4* 31.8* 31.3* 30.6* 33.1*  MCV 102.5* 102.3* 102.0* 102.3* 103.1*  PLT 364 371 364 383 382   Cardiac Enzymes: No results found for this basename: CKTOTAL, CKMB, CKMBINDEX, TROPONINI,  in the last 168 hours BNP (last 3 results) No results found for this basename: PROBNP,  in the last 8760 hours CBG:  Recent Labs Lab 09/05/13 1140 09/05/13 1651 09/05/13 2223 09/06/13 0819 09/06/13 1207  GLUCAP 153* 117* 153* 128* 140*    Recent Results (from the past 240 hour(s))  CLOSTRIDIUM DIFFICILE BY PCR     Status: None   Collection Time    08/30/13  6:09 AM      Result Value Ref Range Status   C difficile by pcr NEGATIVE  NEGATIVE Final     Studies: Dg Tibia/fibula Left  09/05/2013   CLINICAL DATA:  Followup left tibial fracture.  EXAM: LEFT TIBIA AND FIBULA - 2 VIEW  COMPARISON:  06/21/2013  FINDINGS: Intra medullary rod again noted within the left tibia across the distal tibial shaft and metaphyseal fractures. Stable alignment. Evidence of partial healing across the fracture line.  Fractures within the proximal and  distal shafts of the left fibula are again noted with stable alignment. No significant healing callus noted.  IMPRESSION: Partial healing within the tibial fractures. Intra medullary nail has a stable appearance.  No significant evidence of healing across the proximal or distal fibular shaft fractures.   Electronically Signed   By: Rolm Baptise M.D.   On: 09/05/2013 16:24   Ct Abdomen Pelvis W Contrast  09/05/2013   CLINICAL DATA:  Pelvic abscess.  EXAM: CT ABDOMEN AND PELVIS WITH CONTRAST  TECHNIQUE: Multidetector CT imaging of the abdomen and pelvis was performed using the standard protocol following bolus administration of intravenous contrast.  CONTRAST:  1103mL OMNIPAQUE IOHEXOL 300 MG/ML  SOLN  COMPARISON:  CT scan of August 31, 2013.  FINDINGS: Status post interbody fusion of L3-4 and L4-5. Minimal bilateral pleural effusions are noted with adjacent subsegmental atelectasis.  No gallstones are noted. Small hiatal hernia is noted. No focal abnormality is noted in the liver or spleen. Stable dilated pancreatic duct is noted in the pancreatic head. Stable pancreatic cystic lesion measuring 21 x 9 mm is noted in the uncinate process ; it is uncertain if this represents anomalous dilated pancreatic duct. MRI may be performed for further evaluation. Adrenal glands and kidneys appear normal. No hydronephrosis or renal obstruction is noted. Atherosclerotic calcifications of abdominal aorta are noted without aneurysm formation. Percutaneous drain is again noted in the posterior portion of the pelvis with associated fluid collection measuring 16 x 16 mm which is smaller compared to prior exam. Sigmoid diverticulosis is noted without inflammation. Another fluid collection measuring 22 x 20 mm is noted in the right lower quadrant adjacent to the terminal ileum. This portion the terminal ileum does demonstrate wall thickening consistent with inflammation.  This fluid collection is not significantly changed compared to  prior exam and is concerning for possible abscess. Status post right total hip arthroplasty. Stable bilateral inguinal adenopathy.  IMPRESSION: Stable cystic abnormality seen in the uncinate process of the pancreatic head ; it is uncertain if this represents dilated anomalous pancreatic duct or possibly cystic neoplasm. There does appear to be dilatation of the pancreatic duct seen more superiorly in the pancreatic head ; further evaluation with MRI is recommended.  The abscess noted in the posterior portion of the pelvis is smaller compared to prior exam. Continued presence of left transgluteal percutaneous drainage catheter is noted.  Stable 2 cm fluid collection is seen in the right lower quadrant adjacent to terminal ileum which appears to be inflamed with associated wall thickening. This fluid collection is concerning for possible abscess which is stable.   Electronically Signed   By: Sabino Dick M.D.   On: 09/05/2013 16:14    Scheduled Meds: . busPIRone  7.5 mg Oral BID  . diltiazem  30 mg Oral Q12H  . enoxaparin (LOVENOX) injection  85 mg Subcutaneous Q12H  . insulin aspart  0-9 Units Subcutaneous TID WC & HS  . levothyroxine  25 mcg Oral QAC breakfast  . metoprolol tartrate  25 mg Oral BID  . piperacillin-tazobactam (ZOSYN)  IV  3.375 g Intravenous 3 times per day  . temazepam  30 mg Oral QHS   Continuous Infusions: . sodium chloride 10 mL/hr at 09/05/13 1004  . Marland KitchenTPN (CLINIMIX-E) Adult 40 mL/hr at 09/05/13 1716   And  . fat emulsion 250 mL (09/05/13 1716)  . Marland KitchenTPN (CLINIMIX-E) Adult     And  . fat emulsion      Principal Problem:   Sepsis Active Problems:   HYPOTHYROIDISM   Iron deficiency anemia, unspecified   HYPERTENSION   Atrial flutter   Unspecified constipation   Metabolic acidosis   Diverticulitis of colon with perforation    Time spent: 30 min    Zoha Spranger, [facility] Pager [phone]. If 7PM-7AM, please contact night-coverage at [email or web address],  password [facility] 09/06/2013, 3:39 PM  LOS: 12 days

## 2013-09-07 LAB — CBC WITH DIFFERENTIAL/PLATELET
Basophils Absolute: 0 10*3/uL (ref 0.0–0.1)
Basophils Relative: 1 % (ref 0–1)
Eosinophils Absolute: 0.4 10*3/uL (ref 0.0–0.7)
Eosinophils Relative: 6 % — ABNORMAL HIGH (ref 0–5)
HCT: 32.6 % — ABNORMAL LOW (ref 36.0–46.0)
Hemoglobin: 11 g/dL — ABNORMAL LOW (ref 12.0–15.0)
Lymphocytes Relative: 17 % (ref 12–46)
Lymphs Abs: 1.3 10*3/uL (ref 0.7–4.0)
MCH: 35.7 pg — ABNORMAL HIGH (ref 26.0–34.0)
MCHC: 33.7 g/dL (ref 30.0–36.0)
MCV: 105.8 fL — ABNORMAL HIGH (ref 78.0–100.0)
Monocytes Absolute: 0.9 10*3/uL (ref 0.1–1.0)
Monocytes Relative: 12 % (ref 3–12)
Neutro Abs: 4.7 10*3/uL (ref 1.7–7.7)
Neutrophils Relative %: 64 % (ref 43–77)
Platelets: 417 10*3/uL — ABNORMAL HIGH (ref 150–400)
RBC: 3.08 MIL/uL — ABNORMAL LOW (ref 3.87–5.11)
RDW: 12.8 % (ref 11.5–15.5)
WBC: 7.3 10*3/uL (ref 4.0–10.5)

## 2013-09-07 LAB — COMPREHENSIVE METABOLIC PANEL
ALT: 43 U/L — ABNORMAL HIGH (ref 0–35)
AST: 34 U/L (ref 0–37)
Albumin: 2.1 g/dL — ABNORMAL LOW (ref 3.5–5.2)
Alkaline Phosphatase: 79 U/L (ref 39–117)
Anion gap: 13 (ref 5–15)
BUN: 12 mg/dL (ref 6–23)
CO2: 23 mEq/L (ref 19–32)
Calcium: 8.7 mg/dL (ref 8.4–10.5)
Chloride: 99 mEq/L (ref 96–112)
Creatinine, Ser: 0.71 mg/dL (ref 0.50–1.10)
GFR calc Af Amer: 89 mL/min — ABNORMAL LOW (ref >90)
GFR calc non Af Amer: 76 mL/min — ABNORMAL LOW (ref >90)
Glucose, Bld: 105 mg/dL — ABNORMAL HIGH (ref 70–99)
Potassium: 4.2 mEq/L (ref 3.7–5.3)
Sodium: 135 mEq/L — ABNORMAL LOW (ref 137–147)
Total Bilirubin: 0.4 mg/dL (ref 0.3–1.2)
Total Protein: 6.3 g/dL (ref 6.0–8.3)

## 2013-09-07 LAB — GLUCOSE, CAPILLARY
Glucose-Capillary: 130 mg/dL — ABNORMAL HIGH (ref 70–99)
Glucose-Capillary: 188 mg/dL — ABNORMAL HIGH (ref 70–99)

## 2013-09-07 LAB — PROTIME-INR
INR: 1.16 (ref 0.00–1.49)
Prothrombin Time: 14.8 seconds (ref 11.6–15.2)

## 2013-09-07 LAB — MAGNESIUM: Magnesium: 1.6 mg/dL (ref 1.5–2.5)

## 2013-09-07 MED ORDER — TRAMADOL HCL 50 MG PO TABS: 50.0000 mg | ORAL_TABLET | Freq: Four times a day (QID) | ORAL | Status: DC | PRN

## 2013-09-07 MED ORDER — MAGNESIUM SULFATE 40 MG/ML IJ SOLN
2.0000 g | Freq: Once | INTRAMUSCULAR | Status: AC
  Administered 2013-09-07: 2 g via INTRAVENOUS
  Filled 2013-09-07: qty 50

## 2013-09-07 MED ORDER — METRONIDAZOLE 500 MG PO TABS
500.0000 mg | ORAL_TABLET | Freq: Three times a day (TID) | ORAL | Status: DC
Start: 2013-09-07 — End: 2013-09-08
  Administered 2013-09-07 – 2013-09-08 (×4): 500 mg via ORAL
  Filled 2013-09-07 (×7): qty 1

## 2013-09-07 MED ORDER — LEVOFLOXACIN 750 MG PO TABS
750.0000 mg | ORAL_TABLET | ORAL | Status: DC
  Administered 2013-09-07: 750 mg via ORAL
  Filled 2013-09-07 (×2): qty 1

## 2013-09-07 NOTE — Progress Notes (Signed)
Physical Therapy Treatment Patient Details Name: [name] MRN: [id number] DOB: 1925-10-28 Today's Date: 09/07/2013    History of Present Illness Pt is an 80 y/o female admitted from SNF with abdominal pain. Was found to be septic and pt is s/p left pelvic/diverticular abscess drainage 8/7. PMH includes fall with open fracture on LLE 1 month ago. Pt was at [facility] s/p surgical intervention with wound vac. Pt states she is TDWB however MD note states NWB.     PT Comments    Pt very anxious with transfers. Required max encouragement to participate. Recommend use of lift to RN to return pt to bed. Encouraged 1-2 hours OOB activity as tolerated. Cont to recommend SNF for post acute rehab.   Follow Up Recommendations  SNF;Supervision/Assistance - 24 hour     Equipment Recommendations  None recommended by PT    Recommendations for Other Services       Precautions / Restrictions Precautions Precautions: Fall Precaution Comments: Wound vac Restrictions Weight Bearing Restrictions: Yes LLE Weight Bearing: Non weight bearing    Mobility  Bed Mobility Overal bed mobility: Needs Assistance Bed Mobility: Supine to Sit     Supine to sit: Supervision;HOB elevated     General bed mobility comments: no physical (A) needed; supervision for safety and cues for hand placement; relies on handrails   Transfers Overall transfer level: Needs assistance Equipment used: Rolling walker (2 wheeled) Transfers: Sit to/from [facility] Sit to Stand: Mod assist;+2 physical assistance;+2 safety/equipment Stand pivot transfers: Max assist;Total assist;+2 physical assistance       General transfer comment: performed sit to stand x 2; pt very anxious and demo difficulty maintaining NWB status; tolerated standing initially ~30 seconds and returned to bed; max (A) to keep Lt LE off ground and cues for upright posture and hand placement with RW  Ambulation/Gait              General Gait Details: Unable at this time.    Stairs            Wheelchair Mobility    Modified Rankin (Stroke Patients Only)       Balance Overall balance assessment: Needs assistance Sitting-balance support: Feet supported;No upper extremity supported Sitting balance-Leahy Scale: Good   Postural control: Posterior lean Standing balance support: During functional activity;Bilateral upper extremity supported Standing balance-Leahy Scale: Zero Standing balance comment: 2 person max/total (A)                     Cognition Arousal/Alertness: Awake/alert Behavior During Therapy: Anxious Overall Cognitive Status: Impaired/Different from baseline Area of Impairment: Memory;Problem solving     Memory: Decreased short-term memory       Problem Solving: Slow processing;Decreased initiation;Requires verbal cues;Difficulty sequencing General Comments: repeating statements and questions throughout session     Exercises General Exercises - Lower Extremity Ankle Circles/Pumps: AROM;Right;10 reps;Supine Quad Sets: Strengthening;Both;15 reps;Seated Long Arc Quad: AROM;Strengthening;Right;10 reps;Seated Heel Slides: AROM;Strengthening;Both;10 reps;Supine    General Comments        Pertinent Vitals/Pain Pain Assessment: No/denies pain    Home Living                      Prior Function            PT Goals (current goals can now be found in the care plan section) Acute Rehab PT Goals Patient Stated Goal: pt stated multiple times; "im going home tomorrow"  PT Goal Formulation: With patient/family Time For  Goal Achievement: 09/12/13 Potential to Achieve Goals: Good Progress towards PT goals: Progressing toward goals    Frequency  Min 2X/week    PT Plan Current plan remains appropriate    Co-evaluation             End of Session Equipment Utilized During Treatment: Gait belt Activity Tolerance: Patient limited by fatigue Patient left:  in chair;with call bell/phone within reach;with family/visitor present;with nursing/sitter in room     Time: 5038-8828 PT Time Calculation (min): 25 min  Charges:  $Therapeutic Exercise: 8-22 mins $Therapeutic Activity: 8-22 mins                    G CodesGustavus Bryant, [place]  [phone] 09/07/2013, 5:13 PM

## 2013-09-07 NOTE — Progress Notes (Signed)
Patient ID: [name], female   DOB: 1925/11/14, 80 y.o.   MRN: [id number]     [facility]      [address] [place], [place] [address]    Phone: [phone] FAX: [phone]     Subjective: Developed a rash, zosyn stopped and changed to [place].  Afebrile. VSS.  Tolerated soft diet x2 meals.    Objective:  Vital signs:  Filed Vitals:   09/06/13 0608 09/06/13 1405 09/06/13 2110 09/07/13 0444  BP: 140/76 111/56 129/80 117/63  Pulse: 88 76 112 88  Temp: 98.2 F (36.8 C) 98.7 F (37.1 C) 98.3 F (36.8 C) 98.2 F (36.8 C)  TempSrc: Oral Oral Oral Oral  Resp: _0 Height:      Weight:      SpO2: 97% 97% 100% 96%    Last BM Date: 09/06/13  Intake/Output   Yesterday:  08/18 0701 - 08/19 0700 In: 2591.7 [P.O.:1470; I.V.:205.7; TPN:916] Out: 2001 [Urine:2000; Stool:1] This shift:    I/O last 3 completed shifts: In: 2891.7 [P.O.:1670; I.V.:205.7; IV Piggyback:100] Out: 2851 [Urine:2850; Stool:1]    Physical Exam:  General: Pt awake/alert/oriented x4 in no acute distress  Abdomen: Soft. Nondistended. Non tender. No evidence of peritonitis. No incarcerated hernias. Left gluteal drain with serous output(66m recorded yesterday).  Ext: LLE dressing, boot. No mjr edema. No cyanosis  Skin: rash  Problem List:   Principal Problem:   Sepsis Active Problems:   HYPOTHYROIDISM   Iron deficiency anemia, unspecified   HYPERTENSION   Atrial flutter   Unspecified constipation   Metabolic acidosis   Diverticulitis of colon with perforation   Morbilliform rash    Results:   Labs: Results for orders placed during the hospital encounter of 08/25/13 (from the past 48 hour(s))  GLUCOSE, CAPILLARY     Status: Abnormal   Collection Time    09/05/13 11:40 AM      Result Value Ref Range   Glucose-Capillary 153 (*) 70 - 99 mg/dL  GLUCOSE, CAPILLARY     Status: Abnormal   Collection Time    09/05/13  4:51 PM   Result Value Ref Range   Glucose-Capillary 117 (*) 70 - 99 mg/dL  GLUCOSE, CAPILLARY     Status: Abnormal   Collection Time    09/05/13 10:23 PM      Result Value Ref Range   Glucose-Capillary 153 (*) 70 - 99 mg/dL   Comment 1 Notify RN    PROTIME-INR     Status: Abnormal   Collection Time    09/06/13  5:50 AM      Result Value Ref Range   Prothrombin Time 15.4 (*) 11.6 - 15.2 seconds   INR 1.22  0.00 - 1.49  CBC WITH DIFFERENTIAL     Status: Abnormal   Collection Time    09/06/13  5:50 AM      Result Value Ref Range   WBC 5.7  4.0 - 10.5 K/uL   RBC 3.21 (*) 3.87 - 5.11 MIL/uL   Hemoglobin 11.1 (*) 12.0 - 15.0 g/dL   HCT 33.1 (*) 36.0 - 46.0 %   MCV 103.1 (*) 78.0 - 100.0 fL   MCH 34.6 (*) 26.0 - 34.0 pg   MCHC 33.5  30.0 - 36.0 g/dL   RDW 12.7  11.5 - 15.5 %   Platelets 382  150 - 400 K/uL   Neutrophils Relative % 61  43 - 77 %  Neutro Abs 3.5  1.7 - 7.7 K/uL   Lymphocytes Relative 18  12 - 46 %   Lymphs Abs 1.0  0.7 - 4.0 K/uL   Monocytes Relative 15 (*) 3 - 12 %   Monocytes Absolute 0.9  0.1 - 1.0 K/uL   Eosinophils Relative 5  0 - 5 %   Eosinophils Absolute 0.3  0.0 - 0.7 K/uL   Basophils Relative 1  0 - 1 %   Basophils Absolute 0.1  0.0 - 0.1 K/uL  COMPREHENSIVE METABOLIC PANEL     Status: Abnormal   Collection Time    09/06/13  5:50 AM      Result Value Ref Range   Sodium 136 (*) 137 - 147 mEq/L   Potassium 3.7  3.7 - 5.3 mEq/L   Chloride 99  96 - 112 mEq/L   CO2 26  19 - 32 mEq/L   Glucose, Bld 119 (*) 70 - 99 mg/dL   BUN 12  6 - 23 mg/dL   Creatinine, Ser 0.73  0.50 - 1.10 mg/dL   Calcium 8.6  8.4 - 10.5 mg/dL   Total Protein 6.5  6.0 - 8.3 g/dL   Albumin 2.2 (*) 3.5 - 5.2 g/dL   AST 33  0 - 37 U/L   ALT 46 (*) 0 - 35 U/L   Alkaline Phosphatase 83  39 - 117 U/L   Total Bilirubin 0.4  0.3 - 1.2 mg/dL   GFR calc non Af Amer 76 (*) >90 mL/min   GFR calc Af Amer 88 (*) >90 mL/min   Comment: (NOTE)     The eGFR has been calculated using the CKD EPI  equation.     This calculation has not been validated in all clinical situations.     eGFR's persistently <90 mL/min signify possible Chronic Kidney     Disease.   Anion gap 11  5 - 15  MAGNESIUM     Status: None   Collection Time    09/06/13  5:50 AM      Result Value Ref Range   Magnesium 2.0  1.5 - 2.5 mg/dL  GLUCOSE, CAPILLARY     Status: Abnormal   Collection Time    09/06/13  8:19 AM      Result Value Ref Range   Glucose-Capillary 128 (*) 70 - 99 mg/dL  GLUCOSE, CAPILLARY     Status: Abnormal   Collection Time    09/06/13 12:07 PM      Result Value Ref Range   Glucose-Capillary 140 (*) 70 - 99 mg/dL  GLUCOSE, CAPILLARY     Status: Abnormal   Collection Time    09/06/13  5:50 PM      Result Value Ref Range   Glucose-Capillary 129 (*) 70 - 99 mg/dL  GLUCOSE, CAPILLARY     Status: Abnormal   Collection Time    09/06/13  9:24 PM      Result Value Ref Range   Glucose-Capillary 165 (*) 70 - 99 mg/dL   Comment 1 Notify RN    PROTIME-INR     Status: None   Collection Time    09/07/13  4:00 AM      Result Value Ref Range   Prothrombin Time 14.8  11.6 - 15.2 seconds   INR 1.16  0.00 - 1.49  CBC WITH DIFFERENTIAL     Status: Abnormal   Collection Time    09/07/13  4:00 AM      Result Value Ref  Range   WBC 7.3  4.0 - 10.5 K/uL   RBC 3.08 (*) 3.87 - 5.11 MIL/uL   Hemoglobin 11.0 (*) 12.0 - 15.0 g/dL   HCT 32.6 (*) 36.0 - 46.0 %   MCV 105.8 (*) 78.0 - 100.0 fL   MCH 35.7 (*) 26.0 - 34.0 pg   MCHC 33.7  30.0 - 36.0 g/dL   RDW 12.8  11.5 - 15.5 %   Platelets 417 (*) 150 - 400 K/uL   Neutrophils Relative % 64  43 - 77 %   Neutro Abs 4.7  1.7 - 7.7 K/uL   Lymphocytes Relative 17  12 - 46 %   Lymphs Abs 1.3  0.7 - 4.0 K/uL   Monocytes Relative 12  3 - 12 %   Monocytes Absolute 0.9  0.1 - 1.0 K/uL   Eosinophils Relative 6 (*) 0 - 5 %   Eosinophils Absolute 0.4  0.0 - 0.7 K/uL   Basophils Relative 1  0 - 1 %   Basophils Absolute 0.0  0.0 - 0.1 K/uL  COMPREHENSIVE  METABOLIC PANEL     Status: Abnormal   Collection Time    09/07/13  4:00 AM      Result Value Ref Range   Sodium 135 (*) 137 - 147 mEq/L   Potassium 4.2  3.7 - 5.3 mEq/L   Chloride 99  96 - 112 mEq/L   CO2 23  19 - 32 mEq/L   Glucose, Bld 105 (*) 70 - 99 mg/dL   BUN 12  6 - 23 mg/dL   Creatinine, Ser 0.71  0.50 - 1.10 mg/dL   Calcium 8.7  8.4 - 10.5 mg/dL   Total Protein 6.3  6.0 - 8.3 g/dL   Albumin 2.1 (*) 3.5 - 5.2 g/dL   AST 34  0 - 37 U/L   ALT 43 (*) 0 - 35 U/L   Alkaline Phosphatase 79  39 - 117 U/L   Total Bilirubin 0.4  0.3 - 1.2 mg/dL   GFR calc non Af Amer 76 (*) >90 mL/min   GFR calc Af Amer 89 (*) >90 mL/min   Comment: (NOTE)     The eGFR has been calculated using the CKD EPI equation.     This calculation has not been validated in all clinical situations.     eGFR's persistently <90 mL/min signify possible Chronic Kidney     Disease.   Anion gap 13  5 - 15  MAGNESIUM     Status: None   Collection Time    09/07/13  4:00 AM      Result Value Ref Range   Magnesium 1.6  1.5 - 2.5 mg/dL    Imaging / Studies: Dg Tibia/fibula Left  09/05/2013   CLINICAL DATA:  Followup left tibial fracture.  EXAM: LEFT TIBIA AND FIBULA - 2 VIEW  COMPARISON:  06/21/2013  FINDINGS: Intra medullary rod again noted within the left tibia across the distal tibial shaft and metaphyseal fractures. Stable alignment. Evidence of partial healing across the fracture line.  Fractures within the proximal and distal shafts of the left fibula are again noted with stable alignment. No significant healing callus noted.  IMPRESSION: Partial healing within the tibial fractures. Intra medullary nail has a stable appearance.  No significant evidence of healing across the proximal or distal fibular shaft fractures.   Electronically Signed   By: Rolm Baptise M.D.   On: 09/05/2013 16:24   Ct Abdomen Pelvis W Contrast  09/05/2013  CLINICAL DATA:  Pelvic abscess.  EXAM: CT ABDOMEN AND PELVIS WITH CONTRAST   TECHNIQUE: Multidetector CT imaging of the abdomen and pelvis was performed using the standard protocol following bolus administration of intravenous contrast.  CONTRAST:  131m OMNIPAQUE IOHEXOL 300 MG/ML  SOLN  COMPARISON:  CT scan of August 31, 2013.  FINDINGS: Status post interbody fusion of L3-4 and L4-5. Minimal bilateral pleural effusions are noted with adjacent subsegmental atelectasis.  No gallstones are noted. Small hiatal hernia is noted. No focal abnormality is noted in the liver or spleen. Stable dilated pancreatic duct is noted in the pancreatic head. Stable pancreatic cystic lesion measuring 21 x 9 mm is noted in the uncinate process ; it is uncertain if this represents anomalous dilated pancreatic duct. MRI may be performed for further evaluation. Adrenal glands and kidneys appear normal. No hydronephrosis or renal obstruction is noted. Atherosclerotic calcifications of abdominal aorta are noted without aneurysm formation. Percutaneous drain is again noted in the posterior portion of the pelvis with associated fluid collection measuring 16 x 16 mm which is smaller compared to prior exam. Sigmoid diverticulosis is noted without inflammation. Another fluid collection measuring 22 x 20 mm is noted in the right lower quadrant adjacent to the terminal ileum. This portion the terminal ileum does demonstrate wall thickening consistent with inflammation. This fluid collection is not significantly changed compared to prior exam and is concerning for possible abscess. Status post right total hip arthroplasty. Stable bilateral inguinal adenopathy.  IMPRESSION: Stable cystic abnormality seen in the uncinate process of the pancreatic head ; it is uncertain if this represents dilated anomalous pancreatic duct or possibly cystic neoplasm. There does appear to be dilatation of the pancreatic duct seen more superiorly in the pancreatic head ; further evaluation with MRI is recommended.  The abscess noted in the  posterior portion of the pelvis is smaller compared to prior exam. Continued presence of left transgluteal percutaneous drainage catheter is noted.  Stable 2 cm fluid collection is seen in the right lower quadrant adjacent to terminal ileum which appears to be inflamed with associated wall thickening. This fluid collection is concerning for possible abscess which is stable.   Electronically Signed   By: JSabino DickM.D.   On: 09/05/2013 16:14    Medications / Allergies:  Scheduled Meds: . busPIRone  7.5 mg Oral BID  . diltiazem  30 mg Oral Q12H  . enoxaparin (LOVENOX) injection  85 mg Subcutaneous Q12H  . feeding supplement (ENSURE COMPLETE)  237 mL Oral Q24H  . feeding supplement (PRO-STAT SUGAR FREE 64)  30 mL Oral Q24H  . insulin aspart  0-9 Units Subcutaneous TID WC & HS  . levofloxacin  750 mg Oral Q24H  . levothyroxine  25 mcg Oral QAC breakfast  . metoprolol tartrate  25 mg Oral BID  . metroNIDAZOLE  500 mg Oral 3 times per day  . nystatin   Topical BID  . temazepam  30 mg Oral QHS   Continuous Infusions: . sodium chloride 10 mL/hr at 09/05/13 1004  . .Marland KitchenPN (CLINIMIX-E) Adult 40 mL/hr at 09/06/13 1717   And  . fat emulsion 250 mL (09/06/13 1717)   PRN Meds:.acetaminophen, acetaminophen, diphenhydrAMINE, famotidine, LORazepam, morphine injection, ondansetron (ZOFRAN) IV, ondansetron, promethazine, sodium chloride  Antibiotics: Anti-infectives   Start     Dose/Rate Route Frequency Ordered Stop   09/07/13 1800  levofloxacin (LEVAQUIN) tablet 750 mg     750 mg Oral Every 24 hours 09/07/13 0750  09/07/13 1000  metroNIDAZOLE (FLAGYL) tablet 500 mg     500 mg Oral 3 times per day 09/07/13 0750     09/06/13 1800  metroNIDAZOLE (FLAGYL) IVPB 500 mg  Status:  Discontinued     500 mg 100 mL/hr over 60 Minutes Intravenous Every 8 hours 09/06/13 1725 09/07/13 0750   09/06/13 1800  levofloxacin (LEVAQUIN) IVPB 750 mg  Status:  Discontinued     750 mg 100 mL/hr over 90 Minutes  Intravenous Every 24 hours 09/06/13 1744 09/07/13 0750   08/28/13 1400  piperacillin-tazobactam (ZOSYN) IVPB 3.375 g  Status:  Discontinued     3.375 g 12.5 mL/hr over 240 Minutes Intravenous 3 times per day 08/28/13 1126 09/06/13 1725   08/26/13 0800  ertapenem (INVANZ) 1 g in sodium chloride 0.9 % 50 mL IVPB  Status:  Discontinued     1 g 100 mL/hr over 30 Minutes Intravenous Every 24 hours 08/26/13 0747 08/28/13 1126   08/26/13 0200  ciprofloxacin (CIPRO) IVPB 400 mg  Status:  Discontinued     400 mg 200 mL/hr over 60 Minutes Intravenous Every 12 hours 08/25/13 1646 08/26/13 0747   08/25/13 1645  metroNIDAZOLE (FLAGYL) IVPB 500 mg  Status:  Discontinued     500 mg 100 mL/hr over 60 Minutes Intravenous Every 8 hours 08/25/13 1636 08/26/13 0747   08/25/13 1345  ciprofloxacin (CIPRO) IVPB 400 mg     400 mg 200 mL/hr over 60 Minutes Intravenous  Once 08/25/13 1344 08/25/13 1515   08/25/13 1345  metroNIDAZOLE (FLAGYL) IVPB 500 mg     500 mg 100 mL/hr over 60 Minutes Intravenous  Once 08/25/13 1344 08/25/13 1554       Assessment/Plan  (Hospital problems)  Atrial flutter-heparin/cardizem  HTN  Hypothyroidism  IDA  Chronic diastolic heart failure   (Principal problem)  sepsis  Abdominal pain, RLQ inflammatory process  Diverticulitis with abscess  -s/p IR drain placement 8/7--->removed 8/18 e coli -Day 1 cipro/flagyl Day 2-4 Invanz, then Zosyn D#9(?drug reaction).  Levaquin/flagyl started 8/18--->change to PO today  -CT of abdomen and pelvis 8/17 showed small posterior pelvis abscess, stable 2cm fluid collection in RLQ--->IR unable to safely drain.  Medically improving, tolerating a soft diet, WBC normal, no pain.  -TPN, consider weaning  -CBC in AM -if she remains stable over the next 24 hours, she may be discharged from surgical standpoint with close follow up with Dr. Donne Hazel.  Erby Pian, [facility] Pager [phone]) For consults and  floor pages call [phone](7A-4:30P)  09/07/2013 8:11 AM

## 2013-09-07 NOTE — Progress Notes (Signed)
Improving - drug rash presumed to be from Zosyn Now on levaquin flagyl  Tolerating diet. Drain removed Possible discharge in next 1-2 days  Wean off TNA  Imogene Burn. Georgette Dover, MD, [facility]  General/ Trauma Surgery  09/07/2013 11:32 AM

## 2013-09-07 NOTE — Progress Notes (Addendum)
[facility] PROGRESS NOTE  [name] BTD:[id number] DOB: 10-Feb-1925 DOA: 08/25/2013 PCP: Walker Kehr, MD  Assessment/Plan: Principal Problem:   Sepsis Active Problems:   HYPOTHYROIDISM   Iron deficiency anemia, unspecified   HYPERTENSION   Atrial flutter   Unspecified constipation   Metabolic acidosis   Diverticulitis of colon with perforation   Morbilliform rash     Brief Summary  80 y.o. female w/ a past medical history of atrial fibrillation on chronic anticoagulation and recent fall which led to an open fracture of the left leg tibia and fibula requiring surgical intervention and wound care with wound VAC. Patient was at [facility] when she developed LLQ pain. CT scan of the abdomen showed inflammatory changes in the pelvis and right lower quadrant with wall thickening of small bowel adjacent loops concerning for ruptured diverticulitis and abscess. General Surgery was consulted and she was started on IV Cipro and Flagyl. She had a drain placed transgluteally to drain a pelvic abscess by IR which was removed on 8/18. Her diet was limited to full liquids with TNA but she is advancing her diet today. Her abscess culture grew three bacteria. For the last week, she has been on zosyn, but she has developed a drug rash so changing to levo/flagyl at recommendation of Dr. Megan Salon.  Her medical course has been complicated by difficult to control RVR in the setting of chronic afib/ a flutter with variable conduction, as well as numerous electrolyte disturbances. Her rate control has now been much more consistent, and she has been titrated back to her usual oral meds.  Plan to d/c back to [facility] SNF once off TPN, hopefully in the next few days.  Significant Events:  8/7 - transgluteal percutaneous drainage in IR  8/7 - PICC line placement  8/8 - begin TNA  8/18 - TNA taper, diet advanced, buttock drain removed   Assessment/Plan  Diverticulitis of colon with perforation  s/p L transgluteal percutaneous drainage in IR 8/7  - Left transgluteal drain removed, s /p IR drain placement 8/7--->removed 8/18 e coli - Small 2cm abscess identified on CT which is not amenable to drainage  - Diet advanced to soft  Off  TPN  -Day 1 cipro/flagyl Day 2-4 Invanz, then Zosyn D#9(?drug reaction). Levaquin/flagyl started 8/18--->change to PO today   Discussed case with Dr. Megan Salon who stated VRE has been treated for 10 days and is less pathogenic than other organisms cultured so okay to stop VRE treatment.  - Abscess cx: pansensitive enterococcus + pan sensitive E coli + beta-lactamase positive Bacteriodes   Morbilliform drug rash likely due to zosyn  - D/c zosyn >> added as drug allergy  - Benadryl  Sepsis (infection, WBC 25, HR 112), remained hemodynamically stable.  - Sepsis physiology resolved  UTI  - Has completed a full course of tx for this issue - appears that urine was not sent for cx  Metabolic acidosis  - Resolved w/ volume resuscitation  Hypokalemia and Hypomagnesemia  - Being managed by pharmacy with TPN  Chronic afib and atrial flutter w/ intermittent RVR, rate controlled  - Continue diltiazem and metoprolol  - Continue full dose lovenox for anticoag  Chronic diastolic CHF EF 10% on echogram in 2012  - currently euvolemic  HTN, stable on dilt and BB  Hypothyroid, stable. Continue synthroid dose.  Hx of Fe deficient anemia  - Fe studies most c/w "anemia of chronic disease" i.e. likely a result of inflammatory state and recent poor nutrition  Macrocytosis, B12 normal/high June 2014 - recheck notes adequate stores (658)  Mechanical fall with left open tibial fracture June 2015, XR demonstrates partial healing within tibial fracture and stable IM nail  Cont current prescribed wound care (followed by Dr. Migdalia Dk)  - wound vac evaluated by Plastics today  - F/u with wound center on Monday  Obesity Unspecified  Diet: soft  Access: PIV and PICC  IVF: TPN DC  ,dc accuchecks      Code Status: DNR  Family Communication: patient alone  Disposition Plan: To [facility] in AM ,off TPN, tolerating oral abx,   Consultants:  Gen Surgery  IR  Antibiotics:  Cipro 8/06  Flagyl 8/06  Ertapenem 8/07 >>8/9  Zosyn 8/9 >> 8/18  Levo 8/18 >>  Flagyl 8/18 >>  DVT prophylaxis:  lovenox      HPI/Subjective: Tolerated soft diet x2 meals.    Objective: Filed Vitals:   09/06/13 1405 09/06/13 2110 09/07/13 0444 09/07/13 1410  BP: 111/56 129/80 117/63 98/51  Pulse: 76 112 88 92  Temp: 98.7 F (37.1 C) 98.3 F (36.8 C) 98.2 F (36.8 C) 98.1 F (36.7 C)  TempSrc: Oral Oral Oral Oral  Resp: 18 18 18 18   Height:      Weight:      SpO2: 97% 100% 96% 97%    Intake/Output Summary (Last 24 hours) at 09/07/13 1456 Last data filed at 09/07/13 1400  Gross per 24 hour  Intake   1596 ml  Output    851 ml  Net    745 ml    Exam:  General: alert & oriented x 3 In NAD  Cardiovascular: RRR, nl S1 s2  Respiratory: Decreased breath sounds at the bases, scattered rhonchi, no crackles  Abdomen: NABS, soft, NT/ND  MSK: Normal tone and bulk, wound vac in place with brace on LLE, serosanguinous drainage. Did not remove dressing on leg. Dressing on buttock c/d/i (drain removed)  Neuro: Grossly intact  Skin: Patient scratching at morbilliform rash most prominent on back and chest but also present on shoulders/upper arms and abdomen. Also has separate erythematous papular rash under breasts         Data Reviewed: Basic Metabolic Panel:  Recent Labs Lab 09/01/13 0500  09/03/13 0500 09/04/13 0238 09/05/13 0300 09/06/13 0550 09/07/13 0400  NA  --   < > 132* 132* 132* 136* 135*  K  --   < > 4.3 3.9 4.0 3.7 4.2  CL  --   < > 97 97 97 99 99  CO2  --   < > 24 24 25 26 23   GLUCOSE  --   < > 198* 160* 143* 119* 105*  BUN  --   < > 13 14 14 12 12   CREATININE  --   < > 0.64 0.69 0.70 0.73 0.71  CALCIUM  --   < > 8.5 8.5 8.6 8.6 8.7  MG  --   <  > 1.8 1.9 1.6 2.0 1.6  PHOS 2.8  --  3.1 2.7 3.1  --   --   < > = values in this interval not displayed.  Liver Function Tests:  Recent Labs Lab 09/03/13 0500 09/04/13 0238 09/05/13 0300 09/06/13 0550 09/07/13 0400  AST 30 29 28  33 34  ALT 39* 42* 39* 46* 43*  ALKPHOS 87 86 79 83 79  BILITOT 0.6 0.5 0.4 0.4 0.4  PROT 6.1 6.3 6.3 6.5 6.3  ALBUMIN 2.1* 2.2* 2.2* 2.2* 2.1*  No results found for this basename: LIPASE, AMYLASE,  in the last 168 hours No results found for this basename: AMMONIA,  in the last 168 hours  CBC:  Recent Labs Lab 09/03/13 0500 09/04/13 0238 09/05/13 0300 09/06/13 0550 09/07/13 0400  WBC 10.0 7.9 7.0 5.7 7.3  NEUTROABS 7.9* 5.9 4.9 3.5 4.7  HGB 10.5* 10.4* 10.1* 11.1* 11.0*  HCT 31.8* 31.3* 30.6* 33.1* 32.6*  MCV 102.3* 102.0* 102.3* 103.1* 105.8*  PLT 371 364 383 382 417*    Cardiac Enzymes: No results found for this basename: CKTOTAL, CKMB, CKMBINDEX, TROPONINI,  in the last 168 hours BNP (last 3 results) No results found for this basename: PROBNP,  in the last 8760 hours   CBG:  Recent Labs Lab 09/06/13 1207 09/06/13 1750 09/06/13 2124 09/07/13 0815 09/07/13 1209  GLUCAP 140* 129* 165* 130* 188*    Recent Results (from the past 240 hour(s))  CLOSTRIDIUM DIFFICILE BY PCR     Status: None   Collection Time    08/30/13  6:09 AM      Result Value Ref Range Status   C difficile by pcr NEGATIVE  NEGATIVE Final     Studies: Dg Tibia/fibula Left  09/05/2013   CLINICAL DATA:  Followup left tibial fracture.  EXAM: LEFT TIBIA AND FIBULA - 2 VIEW  COMPARISON:  06/21/2013  FINDINGS: Intra medullary rod again noted within the left tibia across the distal tibial shaft and metaphyseal fractures. Stable alignment. Evidence of partial healing across the fracture line.  Fractures within the proximal and distal shafts of the left fibula are again noted with stable alignment. No significant healing callus noted.  IMPRESSION: Partial healing  within the tibial fractures. Intra medullary nail has a stable appearance.  No significant evidence of healing across the proximal or distal fibular shaft fractures.   Electronically Signed   By: Rolm Baptise M.D.   On: 09/05/2013 16:24   Ct Abdomen Pelvis W Contrast  09/05/2013   CLINICAL DATA:  Pelvic abscess.  EXAM: CT ABDOMEN AND PELVIS WITH CONTRAST  TECHNIQUE: Multidetector CT imaging of the abdomen and pelvis was performed using the standard protocol following bolus administration of intravenous contrast.  CONTRAST:  178mL OMNIPAQUE IOHEXOL 300 MG/ML  SOLN  COMPARISON:  CT scan of August 31, 2013.  FINDINGS: Status post interbody fusion of L3-4 and L4-5. Minimal bilateral pleural effusions are noted with adjacent subsegmental atelectasis.  No gallstones are noted. Small hiatal hernia is noted. No focal abnormality is noted in the liver or spleen. Stable dilated pancreatic duct is noted in the pancreatic head. Stable pancreatic cystic lesion measuring 21 x 9 mm is noted in the uncinate process ; it is uncertain if this represents anomalous dilated pancreatic duct. MRI may be performed for further evaluation. Adrenal glands and kidneys appear normal. No hydronephrosis or renal obstruction is noted. Atherosclerotic calcifications of abdominal aorta are noted without aneurysm formation. Percutaneous drain is again noted in the posterior portion of the pelvis with associated fluid collection measuring 16 x 16 mm which is smaller compared to prior exam. Sigmoid diverticulosis is noted without inflammation. Another fluid collection measuring 22 x 20 mm is noted in the right lower quadrant adjacent to the terminal ileum. This portion the terminal ileum does demonstrate wall thickening consistent with inflammation. This fluid collection is not significantly changed compared to prior exam and is concerning for possible abscess. Status post right total hip arthroplasty. Stable bilateral inguinal adenopathy.   IMPRESSION: Stable cystic abnormality seen  in the uncinate process of the pancreatic head ; it is uncertain if this represents dilated anomalous pancreatic duct or possibly cystic neoplasm. There does appear to be dilatation of the pancreatic duct seen more superiorly in the pancreatic head ; further evaluation with MRI is recommended.  The abscess noted in the posterior portion of the pelvis is smaller compared to prior exam. Continued presence of left transgluteal percutaneous drainage catheter is noted.  Stable 2 cm fluid collection is seen in the right lower quadrant adjacent to terminal ileum which appears to be inflamed with associated wall thickening. This fluid collection is concerning for possible abscess which is stable.   Electronically Signed   By: Sabino Dick M.D.   On: 09/05/2013 16:14   Ct Abdomen Pelvis W Contrast  08/31/2013   CLINICAL DATA:  Recent diverticular abscess  EXAM: CT ABDOMEN AND PELVIS WITH CONTRAST  TECHNIQUE: Multidetector CT imaging of the abdomen and pelvis was performed using the standard protocol following bolus administration of intravenous contrast. Oral contrast was also administered.  CONTRAST:  157mL OMNIPAQUE IOHEXOL 300 MG/ML  SOLN  COMPARISON:  August 25, 2013 and August 26, 2013  FINDINGS: There is patchy bibasilar lung atelectasis. There are bilateral pleural effusions with mild bibasilar consolidation posteriorly. There is a moderate hiatal hernia. There are multiple foci of coronary artery calcification.  There is fatty change in the liver. No focal liver lesions are identified. There is no biliary duct dilatation. Gallbladder wall is not thickened. There is a questionable tiny gallstone within the gallbladder.  Spleen and adrenals appear normal.  There is dilatation of the pancreatic duct in the region of the head and uncinate process measuring 8 mm, a stable finding. A there is no well-defined mass in this area. Elsewhere the pancreas appears unremarkable.  Kidneys  bilaterally show no hydronephrosis or calculus on either side. There is a 1 x 1 cm cyst in the mid to lower pole left kidney. There is a 6 x 6 mm cyst in the lower pole the right kidney. There is a 5 x 5 mm cyst in the mid left kidney. No noncystic renal masses are appreciable.  In the pelvis, there is a drain within a sigmoid diverticular abscess. The fluid collection is smaller compared to recent prior study. On the current examination, the abscess measures 4.3 x 2.6 cm. Most of the fluid which currently remains is located slightly posterior and medial to the distal aspect of the drainage catheter. Multiple sigmoid diverticula are present with muscular hypertrophy in the sigmoid colon.  There remains generalized bowel dilatation suggesting ileus. No bowel obstruction is seen. There is no free air or portal venous air. There is mild wall thickening in the anterior left pelvis in the area noted previously. No new abscess is seen. Indeed, the area noted previously that was concerning for small abscess in the anterior right pelvis no longer appears to represent a focal abscess. No extra mural air is appreciable in this area, and all fluid in this area appears to reside within bowel. There is mild dilatation of the right fallopian tube which probably is secondary to the surrounding inflammation. This is a stable finding.  There is no appreciable ascites or adenopathy in the abdomen or pelvis. There is atherosclerotic change in aorta but no aneurysm. There is extensive arthropathy in the lumbar spine. There are no blastic or lytic bone lesions. There is a total hip prosthesis on the right. There is postoperative change in the lower lumbar  spine with remodeling in the region of L4.  IMPRESSION: The abscess in the lower left pelvis is smaller with drain in place. Most of the remaining fluid and air is slightly posterior and lateral to the drainage catheter tip. It is difficult to say with certainty that this remaining  fluid connects at this time to the area that has been decompressed by the catheter.  There is small bowel inflammation with wall thickening in the anterior right pelvis with less wall thickening compared to the previous study. The questionable second abscess seen in this area on the recent prior study is no longer appreciable as a discrete fluid collection with air.  There is evidence of generalized ileus. No bowel obstruction is seen.  There is a moderate hiatal hernia.  There are small pleural effusions with bibasilar consolidation. There is also patchy lung atelectatic change bilaterally.  There is a questionable tiny gallstone seen on axial slice 33 series 2.  There is a cystic area in the head and uncinate process of the pancreas which may represent residua of previous pseudocyst. If there is concern for potential cystic pancreatic mass lesion in this area, MRI pre and post-contrast would be the imaging study of choice for further evaluation of this area.  Postoperative change in the lumbar spine with remodeling.   Electronically Signed   By: Lowella Grip M.D.   On: 08/31/2013 14:26   Ct Abdomen Pelvis W Contrast  08/25/2013   CLINICAL DATA:  Right-sided abdominal pain.  EXAM: CT ABDOMEN AND PELVIS WITH CONTRAST  TECHNIQUE: Multidetector CT imaging of the abdomen and pelvis was performed using the standard protocol following bolus administration of intravenous contrast.  CONTRAST:  34mL OMNIPAQUE IOHEXOL 300 MG/ML  SOLN  COMPARISON:  None.  FINDINGS: There are 2 adjacent loops of thick-walled small bowel in the right anterior pelvis, 1 having a maximum wall thickness of 6 mm and the other 5 mm. There is inflammatory type stranding adjacent to these loops. In addition, a fluid collection containing small bubbles of air tracks along the posterior inferior margin of the more anterior loop. Date tubular fluid collection tracks along the inferior margin of the more inferior and posterior loop.  There is a  larger extraluminal fluid collection in the inferior pelvis posterior to the lower uterine segment and to the left of the rectosigmoid. It measures 5.2 cm x 3 cm x 2.2 cm in size. It contain small bubbles of air. This is contiguous with the margin of the rectosigmoid, which may be the origin of this extraluminal collection. There are multiple diverticula noted along the sigmoid colon. This collection may be from a ruptured diverticulum.  There is a small round fluid collection in the right lower quadrant along the posterior peritoneal margin measuring 2 cm in greatest dimension. No air is seen within this collection.  No appendix is seen. However, the findings appear remote from the cecal tip that are most likely not the consequence of acute appendicitis.  There is subsegmental atelectasis at the lung bases. Small moderate hiatal hernia. Heart is mildly enlarged.  Liver, spleen and gallbladder are unremarkable. No bile duct dilation.  There is dilation of the pancreatic duct, maximum of 8 mm in the pancreatic head. This is likely chronic. No pancreatic mass or inflammatory change.  Normal adrenal glands. Kidneys show mild cortical thinning. There is small bilateral low-density renal lesions that are likely cysts. No hydronephrosis. Ureters are unremarkable. No bladder mass or stone.  Uterus and adnexa are  unremarkable other than the adjacent inflammatory changes described above. This  No generalized ascites.  No nondependent free intraperitoneal air.  There are degenerative changes throughout the visualized spine. Bones are demineralized. The right hip prosthesis is well-seated and aligned. There is a mild compression/ burst fracture of L4 that is old. Intervertebral cages have been placed at L3-L4-L4-L5.  IMPRESSION: 1. There are inflammatory changes in the pelvis and right lower quadrant. 2. Two adjacent loops of small bowel in the anterior right pelvis show significant wall thickening, which may be the source of  the inflammatory changes-- an infectious or inflammatory ileitis. The wall thickening could potentially be reactive. There are small adjacent extraluminal fluid collections containing small bubbles of air. A larger extraluminal fluid collection lies along the left margin of the rectosigmoid junction. Findings could be due to ruptured diverticulitis and peridiverticular abscess, with secondary reactive edema/inflammation of the loops of small bowel. 3. No appendix is visualized. Ruptured appendix is unlikely but not excluded-- the findings are not centered on the expected location of the appendix. 4. No other acute findings.   Electronically Signed   By: Lajean Manes M.D.   On: 08/25/2013 13:19   Dg Chest Port 1 View  08/27/2013   CLINICAL DATA:  80 year old female - PICC line placement.  EXAM: PORTABLE CHEST - 1 VIEW  COMPARISON:  08/26/2013 and prior chest radiographs dating back to 12/29/2008  FINDINGS: Cardiomegaly identified.  The right PICC line is present with tip overlying the superior cavoatrial junction.  Increasing subsegmental right basilar atelectasis noted.  There is no evidence of focal airspace disease, pulmonary edema, suspicious pulmonary nodule/mass, pleural effusion, or pneumothorax. No acute bony abnormalities are identified.  Degenerative changes in the shoulders again identified.  IMPRESSION: Right PICC line with tip overlying the superior cavoatrial junction.  Increasing subsegmental right basilar atelectasis.   Electronically Signed   By: Hassan Rowan M.D.   On: 08/27/2013 12:06   Dg Chest Port 1 View  08/26/2013   ADDENDUM REPORT: 08/26/2013 16:35  ADDENDUM: The PICC could be retracted 2 cm to place the tip at the cavoatrial junction.   Electronically Signed   By: Logan Bores   On: 08/26/2013 16:35   08/26/2013   CLINICAL DATA:  Line placement.  EXAM: PORTABLE CHEST - 1 VIEW  COMPARISON:  08/25/2013  FINDINGS: There has been interval placement of a right PICC line with tip projecting over  the mid to upper right atrium. The cardiac silhouette remains mildly enlarged. The lungs are hypoinflated. Linear opacities in the right midlung is unchanged and may reflect atelectasis or scarring. There is slightly increased prominence of the pulmonary vasculature. No new airspace consolidation, pleural effusion, or pneumothorax is identified. No acute osseous abnormality is seen.  IMPRESSION: Right PICC terminating over the mid to upper right atrium.  Electronically Signed: By: Logan Bores On: 08/26/2013 15:46   Dg Chest Portable 1 View  08/25/2013   CLINICAL DATA:  Chest pain, abdominal pain  EXAM: PORTABLE CHEST - 1 VIEW  COMPARISON:  06/16/2013  FINDINGS: Cardiomegaly is noted. Mild elevation of the right hemidiaphragm. No acute infiltrate or pulmonary edema. Stable atelectasis or scarring in right midlung.  IMPRESSION: Cardiomegaly. Chronic elevation of the right hemidiaphragm. No active disease. Stable atelectasis or scarring right midlung.   Electronically Signed   By: Lahoma Crocker M.D.   On: 08/25/2013 09:54   Dg Abd Portable 1v  08/29/2013   CLINICAL DATA:  Confirm NG tube placement.  EXAM:  PORTABLE ABDOMEN - 1 VIEW  COMPARISON:  CT 08/25/2013  FINDINGS: Nasogastric tube courses through the stomach with tip and side-port right of midline in the right upper quadrant likely over the distal stomach or proximal duodenum. A pigtail drainage catheter seen with tip coiled over the left pelvis over the expected region of patient's diverticular abscess. Mild contrast is present within the colon. There are multiple air-filled mildly dilated small bowel loops more prominent than on the recent CT scout film. Remainder of the exam is unchanged.  IMPRESSION: Multiple air-filled mildly dilated small bowel loops worse compared to the recent CT scout film. Findings may be due to obstruction related to the thick-walled small bowel loop in the right lower quadrant on the recent CT scan versus secondary ileus to  patient's presumed diverticulitis.  Nasogastric tube with tip and side-port right of midline in the right upper quadrant likely over the distal stomach or proximal duodenum. Drainage catheter over the left pelvis.   Electronically Signed   By: Marin Olp M.D.   On: 08/29/2013 15:01   Ct Image Guided Drainage By Percutaneous Catheter  08/26/2013   CLINICAL DATA:  Pelvic diverticular abscess  EXAM: CT GUIDED DRAINAGE OF PELVIC DIVERTICULAR ABSCESS  ANESTHESIA/SEDATION: 1.5 Mg IV Versed 50 mcg IV Fentanyl  Total Moderate Sedation Time:  21 minutes  PROCEDURE: The procedure, risks, benefits, and alternatives were explained to the patient. Questions regarding the procedure were encouraged and answered. The patient understands and consents to the procedure.  The left gluteal area was prepped with Betadinein a sterile fashion, and a sterile drape was applied covering the operative field. A sterile gown and sterile gloves were used for the procedure. Local anesthesia was provided with 1% Lidocaine.  Previous imaging reviewed. Patient positioned prone. Noncontrast localization CT performed. The posterior left pelvic abscess was localized. Under sterile conditions and local anesthesia, an 18 gauge 10 cm access needle was advanced percutaneously from a left trans gluteal approach into the collection. Needle position confirmed with CT. Syringe aspiration yielded fecal contaminated fluid. Guidewire inserted followed by tract dilatation to insert a 10 [place] drain. Drain catheter confirmed in the abscess cavity. 25 cc fecal contaminated fluid aspirated. Catheter secured with a Prolene suture and connected to external suction bulb. Sterile dressing applied. No immediate complication. Patient tolerated the procedure well.  COMPLICATIONS: No immediate  FINDINGS: Imaging confirms needle placement in the left pelvic diverticular abscess for drain placement  IMPRESSION: Successful CT-guided left pelvic diverticular abscess drain  insertion. Sample sent for Gram stain culture.   Electronically Signed   By: Daryll Brod M.D.   On: 08/26/2013 14:12    Scheduled Meds: . busPIRone  7.5 mg Oral BID  . diltiazem  30 mg Oral Q12H  . enoxaparin (LOVENOX) injection  85 mg Subcutaneous Q12H  . feeding supplement (ENSURE COMPLETE)  237 mL Oral Q24H  . feeding supplement (PRO-STAT SUGAR FREE 64)  30 mL Oral Q24H  . levofloxacin  750 mg Oral Q24H  . levothyroxine  25 mcg Oral QAC breakfast  . metoprolol tartrate  25 mg Oral BID  . metroNIDAZOLE  500 mg Oral 3 times per day  . nystatin   Topical BID  . temazepam  30 mg Oral QHS   Continuous Infusions: . sodium chloride 10 mL/hr at 09/05/13 1004    Principal Problem:   Sepsis Active Problems:   HYPOTHYROIDISM   Iron deficiency anemia, unspecified   HYPERTENSION   Atrial flutter   Unspecified constipation  Metabolic acidosis   Diverticulitis of colon with perforation   Morbilliform rash    Time spent: 40 minutes   [facility] Pager [phone]. If 7PM-7AM, please contact night-coverage at [email or web address], password [facility] 09/07/2013, 2:56 PM  LOS: 13 days

## 2013-09-07 NOTE — Progress Notes (Addendum)
PARENTERAL NUTRITION CONSULT NOTE - FOLLOW UP  Pharmacy Consult for TPN Indication: Diverticulitis s/p drain, inability to advance diet  Allergies  Allergen Reactions  . Atenolol Nausea And Vomiting  . Clarithromycin Nausea And Vomiting  . Codeine Sulfate Nausea Only  . Macrodantin Other (See Comments)    Long time ago - reaction unknown  . Percocet [Oxycodone-Acetaminophen] Nausea And Vomiting  . Zosyn [Piperacillin Sod-Tazobactam So] Rash    Morbilliform eruption with itching    Patient Measurements: Height: 5\' 3"  (160 cm) Weight: 183 lb 6.8 oz (83.2 kg) IBW/kg (Calculated) : 52.4 Adjusted Body Weight: 62.2 kg  Vital Signs: Temp: 98.2 F (36.8 C) (08/19 0444) Temp src: Oral (08/19 0444) BP: 117/63 mmHg (08/19 0444) Pulse Rate: 88 (08/19 0444) Intake/Output from previous day: 08/18 0701 - 08/19 0700 In: 2591.7 [P.O.:1470; I.V.:205.7; TPN:916] Out: 2001 [Urine:2000; Stool:1] Intake/Output from this shift:    Labs:  Recent Labs  09/05/13 0300 09/06/13 0550 09/07/13 0400  WBC 7.0 5.7 7.3  HGB 10.1* 11.1* 11.0*  HCT 30.6* 33.1* 32.6*  PLT 383 382 417*  INR 1.16 1.22 1.16     Recent Labs  09/05/13 0300 09/06/13 0550 09/07/13 0400  NA 132* 136* 135*  K 4.0 3.7 4.2  CL 97 99 99  CO2 25 26 23   GLUCOSE 143* 119* 105*  BUN 14 12 12   CREATININE 0.70 0.73 0.71  CALCIUM 8.6 8.6 8.7  MG 1.6 2.0 1.6  PHOS 3.1  --   --   PROT 6.3 6.5 6.3  ALBUMIN 2.2* 2.2* 2.1*  AST 28 33 34  ALT 39* 46* 43*  ALKPHOS 79 83 79  BILITOT 0.4 0.4 0.4  PREALBUMIN 13.8*  --   --   TRIG 82  --   --    Estimated Creatinine Clearance: 52.5 ml/min (by C-G formula based on Cr of 0.71).    Recent Labs  09/06/13 1207 09/06/13 1750 09/06/13 2124  GLUCAP 140* 129* 165*    Insulin Requirements in the past 24 hours:  6 units SSI  Current Nutrition:  Clinimix E 5/15 at 62ml/hr + 20% lipid emulsion at 10 ml/hr- providing 48 g protein and 922 kcal daily  Diet orders * 8/16:  Full liquid: 25% of breakfast, 60% of lunch, 50% of dinner * 8/17: Full liquid: 40% of breakfast >> transitioned to NPO after CT report * 8/18: Soft diet: 50% of breakfast, 100% of lunch, 30% of dinner  Nutritional Goals:  1750-1950 kCal, 110-125 grams of protein per day per RD note on 8/12  Admit: Diverticulitis with abscess s/p drainage per IR  GI: Diverticulitis with abscess, drain placed 8/7. CT 8/12 showed resolution of abscess with other small fluid collections noted. The patient was tolerating a full liquid diet and TPN rate was being reduced to help with appetite stimulation. Repeat CT on 8/17 showed small posterior pelvic abscess and stable 2 cm fluid collection in the RLQ - pt made NPO while awaiting IR review for possible perc drain. Upon IR review, no surgical intervention found - CCS transitioned pt to soft diet. Will continue TPN at half rate to help stimulate appetite - likely can wean off on 8/19. Last BM 8/18. Prealbumin improving, 13.8 (8/17) << 8 (8/10)  Endo: Last TSH from June was normal. On po synthroid. CBGs 128-165  Lytes: K 4.2, Mg 1.6, Phos 3.1 (8/17), CorCa 10.1. Na 135. Will replace Mg today.   Renal: IV fluids- NS at [facility]. UOP 1 ml/kg/hr, SCr 0.71- stable  Pulm: 96/RA  Cards: Afib, HTN, dHF. BP/HR wnl. Coumadin on hold and currently anticoagulated with full-dose lovenox 85mg /12h. Also on diltiazem po lopressor po  Ortho: recent fall 6/15 which led to an open fracture of the lower extremity requiring surgical intervention and wound care with wound VAC.   Hepatobil: ALT mildly elevated, alb 2.2, trigs 82 on 8/17  Neuro/MSK: Restoril, Buspar  ID:  Zosyn d/ced d/t drug rash and changed to LVQ + Flagyl total abx D#14 for diverticulitis + abscess with Enterococcus (tx completed) + Ecoli in culture from JP drain and B-lactamase + bacteroides thetaiotaomicron in L-buttocks abscess culture. BC from 8/6 negative. CT still showing abscess. Afebrile, WBC wnl - f/u LOT.    Best Practices: Lovenox  TPN Access: PICC line (8/7)  TPN day#: 10 (start date 8/9)  Plan:  1. Will plan to discontinue TPN after the completion of today's bag 2. Magnesium 2g IV x 1 this AM 3. Will d/c TPN labs 4. Continue Lovenox 85 mg SQ every 12 hours 5. F/u restart of warfarin now that the patient is tolerating orals 6. Pharmacy will s/o protocol - please re-consult [place] if TPN needs to be restarted  Alycia Rossetti, PharmD, BCPS Clinical Pharmacist Pager: [phone] 09/07/2013 8:02 AM

## 2013-09-08 DIAGNOSIS — I872 Venous insufficiency (chronic) (peripheral): Secondary | ICD-10-CM | POA: Diagnosis not present

## 2013-09-08 DIAGNOSIS — L97809 Non-pressure chronic ulcer of other part of unspecified lower leg with unspecified severity: Secondary | ICD-10-CM | POA: Diagnosis not present

## 2013-09-08 DIAGNOSIS — K6389 Other specified diseases of intestine: Secondary | ICD-10-CM | POA: Diagnosis not present

## 2013-09-08 DIAGNOSIS — B999 Unspecified infectious disease: Secondary | ICD-10-CM | POA: Diagnosis not present

## 2013-09-08 DIAGNOSIS — K630 Abscess of intestine: Secondary | ICD-10-CM | POA: Diagnosis not present

## 2013-09-08 DIAGNOSIS — E569 Vitamin deficiency, unspecified: Secondary | ICD-10-CM | POA: Diagnosis not present

## 2013-09-08 DIAGNOSIS — IMO0001 Reserved for inherently not codable concepts without codable children: Secondary | ICD-10-CM | POA: Diagnosis not present

## 2013-09-08 DIAGNOSIS — R21 Rash and other nonspecific skin eruption: Secondary | ICD-10-CM | POA: Diagnosis not present

## 2013-09-08 DIAGNOSIS — K5732 Diverticulitis of large intestine without perforation or abscess without bleeding: Secondary | ICD-10-CM | POA: Diagnosis not present

## 2013-09-08 DIAGNOSIS — R1031 Right lower quadrant pain: Secondary | ICD-10-CM | POA: Diagnosis not present

## 2013-09-08 DIAGNOSIS — K838 Other specified diseases of biliary tract: Secondary | ICD-10-CM | POA: Diagnosis not present

## 2013-09-08 DIAGNOSIS — L97909 Non-pressure chronic ulcer of unspecified part of unspecified lower leg with unspecified severity: Secondary | ICD-10-CM | POA: Diagnosis not present

## 2013-09-08 DIAGNOSIS — R52 Pain, unspecified: Secondary | ICD-10-CM | POA: Diagnosis not present

## 2013-09-08 DIAGNOSIS — B354 Tinea corporis: Secondary | ICD-10-CM | POA: Diagnosis not present

## 2013-09-08 DIAGNOSIS — K269 Duodenal ulcer, unspecified as acute or chronic, without hemorrhage or perforation: Secondary | ICD-10-CM | POA: Diagnosis not present

## 2013-09-08 DIAGNOSIS — S82209A Unspecified fracture of shaft of unspecified tibia, initial encounter for closed fracture: Secondary | ICD-10-CM | POA: Diagnosis not present

## 2013-09-08 DIAGNOSIS — M6281 Muscle weakness (generalized): Secondary | ICD-10-CM | POA: Diagnosis not present

## 2013-09-08 DIAGNOSIS — R198 Other specified symptoms and signs involving the digestive system and abdomen: Secondary | ICD-10-CM | POA: Diagnosis not present

## 2013-09-08 DIAGNOSIS — S82209B Unspecified fracture of shaft of unspecified tibia, initial encounter for open fracture type I or II: Secondary | ICD-10-CM | POA: Diagnosis not present

## 2013-09-08 DIAGNOSIS — Z5189 Encounter for other specified aftercare: Secondary | ICD-10-CM | POA: Diagnosis not present

## 2013-09-08 DIAGNOSIS — S8290XD Unspecified fracture of unspecified lower leg, subsequent encounter for closed fracture with routine healing: Secondary | ICD-10-CM | POA: Diagnosis not present

## 2013-09-08 DIAGNOSIS — K651 Peritoneal abscess: Secondary | ICD-10-CM | POA: Diagnosis not present

## 2013-09-08 DIAGNOSIS — I4891 Unspecified atrial fibrillation: Secondary | ICD-10-CM | POA: Diagnosis not present

## 2013-09-08 DIAGNOSIS — K5730 Diverticulosis of large intestine without perforation or abscess without bleeding: Secondary | ICD-10-CM | POA: Diagnosis not present

## 2013-09-08 DIAGNOSIS — S82899B Other fracture of unspecified lower leg, initial encounter for open fracture type I or II: Secondary | ICD-10-CM | POA: Diagnosis not present

## 2013-09-08 DIAGNOSIS — A419 Sepsis, unspecified organism: Secondary | ICD-10-CM | POA: Diagnosis not present

## 2013-09-08 DIAGNOSIS — K5289 Other specified noninfective gastroenteritis and colitis: Secondary | ICD-10-CM | POA: Diagnosis not present

## 2013-09-08 DIAGNOSIS — Z9889 Other specified postprocedural states: Secondary | ICD-10-CM | POA: Diagnosis not present

## 2013-09-08 DIAGNOSIS — R262 Difficulty in walking, not elsewhere classified: Secondary | ICD-10-CM | POA: Diagnosis not present

## 2013-09-08 LAB — CBC WITH DIFFERENTIAL/PLATELET
Basophils Absolute: 0.1 10*3/uL (ref 0.0–0.1)
Basophils Relative: 1 % (ref 0–1)
Eosinophils Absolute: 0.5 10*3/uL (ref 0.0–0.7)
Eosinophils Relative: 6 % — ABNORMAL HIGH (ref 0–5)
HCT: 34.1 % — ABNORMAL LOW (ref 36.0–46.0)
Hemoglobin: 11.4 g/dL — ABNORMAL LOW (ref 12.0–15.0)
Lymphocytes Relative: 24 % (ref 12–46)
Lymphs Abs: 2.1 10*3/uL (ref 0.7–4.0)
MCH: 34.2 pg — ABNORMAL HIGH (ref 26.0–34.0)
MCHC: 33.4 g/dL (ref 30.0–36.0)
MCV: 102.4 fL — ABNORMAL HIGH (ref 78.0–100.0)
Monocytes Absolute: 1 10*3/uL (ref 0.1–1.0)
Monocytes Relative: 11 % (ref 3–12)
Neutro Abs: 5.2 10*3/uL (ref 1.7–7.7)
Neutrophils Relative %: 58 % (ref 43–77)
Platelets: 455 10*3/uL — ABNORMAL HIGH (ref 150–400)
RBC: 3.33 MIL/uL — ABNORMAL LOW (ref 3.87–5.11)
RDW: 12.9 % (ref 11.5–15.5)
WBC: 8.9 10*3/uL (ref 4.0–10.5)

## 2013-09-08 LAB — COMPREHENSIVE METABOLIC PANEL
ALT: 36 U/L — ABNORMAL HIGH (ref 0–35)
AST: 27 U/L (ref 0–37)
Albumin: 2.3 g/dL — ABNORMAL LOW (ref 3.5–5.2)
Alkaline Phosphatase: 84 U/L (ref 39–117)
Anion gap: 11 (ref 5–15)
BUN: 15 mg/dL (ref 6–23)
CO2: 24 mEq/L (ref 19–32)
Calcium: 8.8 mg/dL (ref 8.4–10.5)
Chloride: 102 mEq/L (ref 96–112)
Creatinine, Ser: 0.79 mg/dL (ref 0.50–1.10)
GFR calc Af Amer: 86 mL/min — ABNORMAL LOW (ref >90)
GFR calc non Af Amer: 74 mL/min — ABNORMAL LOW (ref >90)
Glucose, Bld: 77 mg/dL (ref 70–99)
Potassium: 4 mEq/L (ref 3.7–5.3)
Sodium: 137 mEq/L (ref 137–147)
Total Bilirubin: 0.5 mg/dL (ref 0.3–1.2)
Total Protein: 6.5 g/dL (ref 6.0–8.3)

## 2013-09-08 LAB — PROTIME-INR
INR: 1.12 (ref 0.00–1.49)
Prothrombin Time: 14.4 seconds (ref 11.6–15.2)

## 2013-09-08 LAB — HEMOGLOBIN A1C
Hgb A1c MFr Bld: 6.1 % — ABNORMAL HIGH (ref <5.7)
Mean Plasma Glucose: 128 mg/dL — ABNORMAL HIGH (ref <117)

## 2013-09-08 LAB — MAGNESIUM: Magnesium: 1.8 mg/dL (ref 1.5–2.5)

## 2013-09-08 MED ORDER — ENOXAPARIN SODIUM 100 MG/ML ~~LOC~~ SOLN: 85.0000 mg | Freq: Two times a day (BID) | SUBCUTANEOUS | Status: DC

## 2013-09-08 MED ORDER — WARFARIN SODIUM 2.5 MG PO TABS: 2.5000 mg | ORAL_TABLET | Freq: Every day | ORAL | Status: DC

## 2013-09-08 MED ORDER — LORAZEPAM 0.5 MG PO TABS: 0.5000 mg | ORAL_TABLET | Freq: Three times a day (TID) | ORAL | Status: DC | PRN

## 2013-09-08 MED ORDER — METOPROLOL TARTRATE 25 MG PO TABS: 25.0000 mg | ORAL_TABLET | Freq: Two times a day (BID) | ORAL | Status: DC

## 2013-09-08 MED ORDER — METRONIDAZOLE 500 MG PO TABS
500.0000 mg | ORAL_TABLET | Freq: Three times a day (TID) | ORAL | Status: AC
Start: 2013-09-08 — End: 2013-09-15

## 2013-09-08 MED ORDER — LEVOFLOXACIN 750 MG PO TABS: 750.0000 mg | ORAL_TABLET | ORAL | Status: AC

## 2013-09-08 NOTE — Progress Notes (Signed)
OK for discharge from surgical standpoint.  Imogene Burn. Georgette Dover, MD, [facility]  General/ Trauma Surgery  09/08/2013 1:39 PM

## 2013-09-08 NOTE — Plan of Care (Signed)
Problem: Food- and Nutrition-Related Knowledge Deficit (NB-1.1) Goal: Nutrition education Formal process to instruct or train a patient/client in a skill or to impart knowledge to help patients/clients voluntarily manage or modify food choices and eating behavior to maintain or improve health. Outcome: Completed/Met Date Met:  09/08/13  RD consulted for education regarding diverticulitis/diverticulosis nutrition therapy.  RD provided "Low-Fiber Nutrition Therapy" and "High-Fiber Nutrition Therapy" handouts from the Academy of Nutrition and Dietetics. Pt was instructed to follow a low fiber diet for 7-10 days and then to transition slowly into a high fiber diet. Lists of foods containing high and low amount of fiber were provided and discussed. Reviewed patient's dietary recall. Also provided ideas to promote variety and to incorporate additional nutrient dense foods into patient's diet. Discussed eating small frequent meals and snacks to assist in increasing overall po intake. Teach back method used.  Expect good compliance.  Body mass index is 32.5 kg/(m^2). Pt meets criteria for obesity based on current BMI.  Current diet order is soft diet, patient is consuming approximately 25-100% of meals at this time. Labs and medications reviewed. No further nutrition interventions warranted at this time. RD contact information provided. If additional nutrition issues arise, please re-consult RD.   Terrace Arabia RD, LDN

## 2013-09-08 NOTE — Progress Notes (Signed)
Per MD order, PICC line removed. Cath intact at 41cm. Vaseline pressure gauze to site, pressure held x 5min. No bleeding to site. Pt instructed to keep dressing CDI x 24 hours. Avoid heavy lifting, pushing or pulling x 24 hours,  If bleeding occurs hold pressure, if bleeding does not stop contact MD or go to the ED. Pt does not have any questions.  Raksha Wolfgang M  

## 2013-09-08 NOTE — Progress Notes (Signed)
Patient ID: [name], female   DOB: [birth year], 80 y.o.   MRN: [id number]      [facility]      [address] [place], [place] [address]    Phone: [phone] FAX: [phone]     Subjective: no changes.  Tolerating soft diet.    Objective:  Vital signs:  Filed Vitals:   09/07/13 1410 09/07/13 2130 09/07/13 2205 09/08/13 0608  BP: 98/51 97/60 113/58 120/53  Pulse: 92 110 94 89  Temp: 98.1 F (36.7 C) 98.2 F (36.8 C)  98.6 F (37 C)  TempSrc: Oral Oral  Oral  Resp: $Remo'18 18  17  'DacRI$ Height:      Weight:      SpO2: 97% 99%  96%    Last BM Date: 09/07/13  Intake/Output   Yesterday:  08/19 0701 - 08/20 0700 In: 8921 [P.O.:780; IV Piggyback:50; TPN:360] Out: 1000 [Urine:1000] This shift:    I/O last 3 completed shifts: In: 2016 [P.O.:1050; IV Piggyback:50] Out: 1941 [Urine:1550]    Physical Exam:  General: Pt awake/alert/oriented x4 in no acute distress  Abdomen: Soft. Nondistended. Non tender. No evidence of peritonitis. No incarcerated hernias.  Ext: LLE dressing, boot. No mjr edema. No cyanosis  Skin: rash   Problem List:   Principal Problem:   Sepsis Active Problems:   HYPOTHYROIDISM   Iron deficiency anemia, unspecified   HYPERTENSION   Atrial flutter   Unspecified constipation   Metabolic acidosis   Diverticulitis of colon with perforation   Morbilliform rash    Results:   Labs: Results for orders placed during the hospital encounter of 08/25/13 (from the past 48 hour(s))  GLUCOSE, CAPILLARY     Status: Abnormal   Collection Time    09/06/13  8:19 AM      Result Value Ref Range   Glucose-Capillary 128 (*) 70 - 99 mg/dL  GLUCOSE, CAPILLARY     Status: Abnormal   Collection Time    09/06/13 12:07 PM      Result Value Ref Range   Glucose-Capillary 140 (*) 70 - 99 mg/dL  GLUCOSE, CAPILLARY     Status: Abnormal   Collection Time    09/06/13  5:50 PM      Result Value Ref Range   Glucose-Capillary 129 (*) 70 - 99 mg/dL  GLUCOSE, CAPILLARY     Status: Abnormal   Collection Time    09/06/13  9:24 PM      Result Value Ref Range   Glucose-Capillary 165 (*) 70 - 99 mg/dL   Comment 1 Notify RN    PROTIME-INR     Status: None   Collection Time    09/07/13  4:00 AM      Result Value Ref Range   Prothrombin Time 14.8  11.6 - 15.2 seconds   INR 1.16  0.00 - 1.49  CBC WITH DIFFERENTIAL     Status: Abnormal   Collection Time    09/07/13  4:00 AM      Result Value Ref Range   WBC 7.3  4.0 - 10.5 K/uL   RBC 3.08 (*) 3.87 - 5.11 MIL/uL   Hemoglobin 11.0 (*) 12.0 - 15.0 g/dL   HCT 32.6 (*) 36.0 - 46.0 %   MCV 105.8 (*) 78.0 - 100.0 fL   MCH 35.7 (*) 26.0 - 34.0 pg   MCHC 33.7  30.0 - 36.0 g/dL   RDW 12.8  11.5 - 15.5 %  Platelets 417 (*) 150 - 400 K/uL   Neutrophils Relative % 64  43 - 77 %   Neutro Abs 4.7  1.7 - 7.7 K/uL   Lymphocytes Relative 17  12 - 46 %   Lymphs Abs 1.3  0.7 - 4.0 K/uL   Monocytes Relative 12  3 - 12 %   Monocytes Absolute 0.9  0.1 - 1.0 K/uL   Eosinophils Relative 6 (*) 0 - 5 %   Eosinophils Absolute 0.4  0.0 - 0.7 K/uL   Basophils Relative 1  0 - 1 %   Basophils Absolute 0.0  0.0 - 0.1 K/uL  COMPREHENSIVE METABOLIC PANEL     Status: Abnormal   Collection Time    09/07/13  4:00 AM      Result Value Ref Range   Sodium 135 (*) 137 - 147 mEq/L   Potassium 4.2  3.7 - 5.3 mEq/L   Chloride 99  96 - 112 mEq/L   CO2 23  19 - 32 mEq/L   Glucose, Bld 105 (*) 70 - 99 mg/dL   BUN 12  6 - 23 mg/dL   Creatinine, Ser 0.71  0.50 - 1.10 mg/dL   Calcium 8.7  8.4 - 10.5 mg/dL   Total Protein 6.3  6.0 - 8.3 g/dL   Albumin 2.1 (*) 3.5 - 5.2 g/dL   AST 34  0 - 37 U/L   ALT 43 (*) 0 - 35 U/L   Alkaline Phosphatase 79  39 - 117 U/L   Total Bilirubin 0.4  0.3 - 1.2 mg/dL   GFR calc non Af Amer 76 (*) >90 mL/min   GFR calc Af Amer 89 (*) >90 mL/min   Comment: (NOTE)     The eGFR has been calculated using the CKD EPI equation.     This calculation has  not been validated in all clinical situations.     eGFR's persistently <90 mL/min signify possible Chronic Kidney     Disease.   Anion gap 13  5 - 15  MAGNESIUM     Status: None   Collection Time    09/07/13  4:00 AM      Result Value Ref Range   Magnesium 1.6  1.5 - 2.5 mg/dL  GLUCOSE, CAPILLARY     Status: Abnormal   Collection Time    09/07/13  8:15 AM      Result Value Ref Range   Glucose-Capillary 130 (*) 70 - 99 mg/dL  GLUCOSE, CAPILLARY     Status: Abnormal   Collection Time    09/07/13 12:09 PM      Result Value Ref Range   Glucose-Capillary 188 (*) 70 - 99 mg/dL  PROTIME-INR     Status: None   Collection Time    09/08/13  5:45 AM      Result Value Ref Range   Prothrombin Time 14.4  11.6 - 15.2 seconds   INR 1.12  0.00 - 1.49  CBC WITH DIFFERENTIAL     Status: Abnormal   Collection Time    09/08/13  5:45 AM      Result Value Ref Range   WBC 8.9  4.0 - 10.5 K/uL   RBC 3.33 (*) 3.87 - 5.11 MIL/uL   Hemoglobin 11.4 (*) 12.0 - 15.0 g/dL   HCT 34.1 (*) 36.0 - 46.0 %   MCV 102.4 (*) 78.0 - 100.0 fL   MCH 34.2 (*) 26.0 - 34.0 pg   MCHC 33.4  30.0 - 36.0 g/dL  RDW 12.9  11.5 - 15.5 %   Platelets 455 (*) 150 - 400 K/uL   Neutrophils Relative % 58  43 - 77 %   Lymphocytes Relative 24  12 - 46 %   Monocytes Relative 11  3 - 12 %   Eosinophils Relative 6 (*) 0 - 5 %   Basophils Relative 1  0 - 1 %   Neutro Abs 5.2  1.7 - 7.7 K/uL   Lymphs Abs 2.1  0.7 - 4.0 K/uL   Monocytes Absolute 1.0  0.1 - 1.0 K/uL   Eosinophils Absolute 0.5  0.0 - 0.7 K/uL   Basophils Absolute 0.1  0.0 - 0.1 K/uL   WBC Morphology TOXIC GRANULATION    COMPREHENSIVE METABOLIC PANEL     Status: Abnormal   Collection Time    09/08/13  5:45 AM      Result Value Ref Range   Sodium 137  137 - 147 mEq/L   Potassium 4.0  3.7 - 5.3 mEq/L   Chloride 102  96 - 112 mEq/L   CO2 24  19 - 32 mEq/L   Glucose, Bld 77  70 - 99 mg/dL   BUN 15  6 - 23 mg/dL   Creatinine, Ser 0.79  0.50 - 1.10 mg/dL   Calcium  8.8  8.4 - 10.5 mg/dL   Total Protein 6.5  6.0 - 8.3 g/dL   Albumin 2.3 (*) 3.5 - 5.2 g/dL   AST 27  0 - 37 U/L   ALT 36 (*) 0 - 35 U/L   Alkaline Phosphatase 84  39 - 117 U/L   Total Bilirubin 0.5  0.3 - 1.2 mg/dL   GFR calc non Af Amer 74 (*) >90 mL/min   GFR calc Af Amer 86 (*) >90 mL/min   Comment: (NOTE)     The eGFR has been calculated using the CKD EPI equation.     This calculation has not been validated in all clinical situations.     eGFR's persistently <90 mL/min signify possible Chronic Kidney     Disease.   Anion gap 11  5 - 15  MAGNESIUM     Status: None   Collection Time    09/08/13  5:45 AM      Result Value Ref Range   Magnesium 1.8  1.5 - 2.5 mg/dL    Imaging / Studies: No results found.  Medications / Allergies:  Scheduled Meds: . busPIRone  7.5 mg Oral BID  . diltiazem  30 mg Oral Q12H  . enoxaparin (LOVENOX) injection  85 mg Subcutaneous Q12H  . feeding supplement (ENSURE COMPLETE)  237 mL Oral Q24H  . feeding supplement (PRO-STAT SUGAR FREE 64)  30 mL Oral Q24H  . levofloxacin  750 mg Oral Q24H  . levothyroxine  25 mcg Oral QAC breakfast  . metoprolol tartrate  25 mg Oral BID  . metroNIDAZOLE  500 mg Oral 3 times per day  . nystatin   Topical BID  . temazepam  30 mg Oral QHS   Continuous Infusions: . sodium chloride 10 mL/hr at 09/05/13 1004   PRN Meds:.acetaminophen, acetaminophen, diphenhydrAMINE, famotidine, LORazepam, morphine injection, ondansetron (ZOFRAN) IV, ondansetron, promethazine, sodium chloride  Antibiotics: Anti-infectives   Start     Dose/Rate Route Frequency Ordered Stop   09/07/13 1800  levofloxacin (LEVAQUIN) tablet 750 mg     750 mg Oral Every 24 hours 09/07/13 0750     09/07/13 1000  metroNIDAZOLE (FLAGYL) tablet 500 mg  500 mg Oral 3 times per day 09/07/13 0750     09/06/13 1800  metroNIDAZOLE (FLAGYL) IVPB 500 mg  Status:  Discontinued     500 mg 100 mL/hr over 60 Minutes Intravenous Every 8 hours 09/06/13 1725  09/07/13 0750   09/06/13 1800  levofloxacin (LEVAQUIN) IVPB 750 mg  Status:  Discontinued     750 mg 100 mL/hr over 90 Minutes Intravenous Every 24 hours 09/06/13 1744 09/07/13 0750   08/28/13 1400  piperacillin-tazobactam (ZOSYN) IVPB 3.375 g  Status:  Discontinued     3.375 g 12.5 mL/hr over 240 Minutes Intravenous 3 times per day 08/28/13 1126 09/06/13 1725   08/26/13 0800  ertapenem (INVANZ) 1 g in sodium chloride 0.9 % 50 mL IVPB  Status:  Discontinued     1 g 100 mL/hr over 30 Minutes Intravenous Every 24 hours 08/26/13 0747 08/28/13 1126   08/26/13 0200  ciprofloxacin (CIPRO) IVPB 400 mg  Status:  Discontinued     400 mg 200 mL/hr over 60 Minutes Intravenous Every 12 hours 08/25/13 1646 08/26/13 0747   08/25/13 1645  metroNIDAZOLE (FLAGYL) IVPB 500 mg  Status:  Discontinued     500 mg 100 mL/hr over 60 Minutes Intravenous Every 8 hours 08/25/13 1636 08/26/13 0747   08/25/13 1345  ciprofloxacin (CIPRO) IVPB 400 mg     400 mg 200 mL/hr over 60 Minutes Intravenous  Once 08/25/13 1344 08/25/13 1515   08/25/13 1345  metroNIDAZOLE (FLAGYL) IVPB 500 mg     500 mg 100 mL/hr over 60 Minutes Intravenous  Once 08/25/13 1344 08/25/13 1554       Assessment/Plan  (Hospital problems)  Atrial flutter-heparin/cardizem  HTN  Hypothyroidism  IDA  Chronic diastolic heart failure   (Principal problem)  sepsis  Abdominal pain, RLQ inflammatory process  Diverticulitis with abscess  -s/p IR drain placement 8/7--->removed 8/18 e coli  -Day 1 cipro/flagyl Day 2-4 Invanz, then Zosyn D#9(?drug reaction). Levaquin/flagyl started 8/18--->changed to PO 8/19 and has tolerated well.  Tolerating soft diet.  Afebrile.  Normal WBC. -pt will actually follow up with Dr. Redmond Pulling.  Schedule CT 7-10 days and f/u with Dr. Redmond Pulling thereafter.  I will have our office arrange this(preferably between 9-2 due to transportation) -may discharge from surgical standpoint.    Erby Pian, [facility]  Surgery Pager [phone]) For consults and floor pages call [phone])  09/08/2013 8:01 AM'

## 2013-09-08 NOTE — Progress Notes (Signed)
Patient discharged to [facility] transported by [facility].

## 2013-09-08 NOTE — Discharge Summary (Signed)
Physician Discharge Summary  [name] MRN: [id number] DOB/AGE: May 23, 1925 80 y.o.  PCP: Walker Kehr, MD   Admit date: 08/25/2013 Discharge date: 09/08/2013  Discharge Diagnoses:      Sepsis Active Problems:   HYPOTHYROIDISM   Iron deficiency anemia, unspecified   HYPERTENSION   Atrial flutter   Unspecified constipation   Metabolic acidosis   Diverticulitis of colon with perforation   Morbilliform rash  Followup recommendations Patient to resume Coumadin after completion of her colonoscopy Until then the patient is on Lovenox f/u with Dr. Redmond Pulling , general surgery in one week      Medication List    STOP taking these medications       metoprolol succinate 50 MG 24 hr tablet  Commonly known as:  TOPROL-XL      TAKE these medications       acetaminophen 500 MG tablet  Commonly known as:  TYLENOL  Take 500 mg by mouth daily as needed for pain.     atorvastatin 10 MG tablet  Commonly known as:  LIPITOR  Take 10 mg by mouth every evening.     B-complex with vitamin C tablet  Take 1 tablet by mouth daily.     bisacodyl 10 MG suppository  Commonly known as:  DULCOLAX  Place 10 mg rectally once.     busPIRone 7.5 MG tablet  Commonly known as:  BUSPAR  Take 7.5 mg by mouth 2 (two) times daily.     cholecalciferol 1000 UNITS tablet  Commonly known as:  VITAMIN D  Take 1,000 Units by mouth daily.     diltiazem 120 MG 24 hr capsule  Commonly known as:  DILACOR XR  Take 120 mg by mouth daily.     enoxaparin 100 MG/ML injection  Commonly known as:  LOVENOX  Inject 0.85 mLs (85 mg total) into the skin every 12 (twelve) hours.     feeding supplement (PRO-STAT SUGAR FREE 64) Liqd  Take 30 mLs by mouth daily.     furosemide 40 MG tablet  Commonly known as:  LASIX  Take 40 mg by mouth 2 (two) times daily.     Iron 325 (65 FE) MG Tabs  Take 1 tablet by mouth daily.     levofloxacin 750 MG tablet  Commonly known as:  LEVAQUIN  Take 1 tablet  (750 mg total) by mouth daily.     levothyroxine 25 MCG tablet  Commonly known as:  SYNTHROID, LEVOTHROID  Take 25 mcg by mouth daily before breakfast.     LORazepam 0.5 MG tablet  Commonly known as:  ATIVAN  Take 1 tablet (0.5 mg total) by mouth every 8 (eight) hours as needed for anxiety.     metoprolol tartrate 25 MG tablet  Commonly known as:  LOPRESSOR  Take 1 tablet (25 mg total) by mouth 2 (two) times daily.     metroNIDAZOLE 500 MG tablet  Commonly known as:  FLAGYL  Take 1 tablet (500 mg total) by mouth every 8 (eight) hours.     mineral oil enema  Place 1 enema rectally daily as needed for mild constipation or severe constipation.     pantoprazole 40 MG tablet  Commonly known as:  PROTONIX  Take 1 tablet (40 mg total) by mouth daily before breakfast.     polyethylene glycol packet  Commonly known as:  MIRALAX / GLYCOLAX  Take 17 g by mouth daily.     potassium chloride SA 20 MEQ tablet  Commonly known as:  K-DUR,KLOR-CON  Take 1 tablet (20 mEq total) by mouth daily.     promethazine 12.5 MG tablet  Commonly known as:  PHENERGAN  Take 12.5 mg by mouth every 6 (six) hours as needed for nausea or vomiting.     senna-docusate 8.6-50 MG per tablet  Commonly known as:  Senokot-S  Take 1 tablet by mouth 2 (two) times daily.     temazepam 30 MG capsule  Commonly known as:  RESTORIL  Take 30 mg by mouth at bedtime.     traMADol 50 MG tablet  Commonly known as:  ULTRAM  Take 1 tablet (50 mg total) by mouth every 6 (six) hours as needed for moderate pain.     vitamin C 500 MG tablet  Commonly known as:  ASCORBIC ACID  Take 500 mg by mouth daily. Take with iron dose     warfarin 2.5 MG tablet  Commonly known as:  COUMADIN  - Take 1-2 tablets (2.5-5 mg total) by mouth daily. Take 2.10m Monday, Tuesday, Wednesday, Thursday, Friday and Saturday.  Take 525mon Sunday.  -   -   - Patient to resume this after completion of all her surgical procedures         Discharge Condition: Stable  Disposition: 01-Home or Self Care   Consults:  General surgery Plastic surgery   Significant Diagnostic Studies: Dg Tibia/fibula Left  09/05/2013   CLINICAL DATA:  Followup left tibial fracture.  EXAM: LEFT TIBIA AND FIBULA - 2 VIEW  COMPARISON:  06/21/2013  FINDINGS: Intra medullary rod again noted within the left tibia across the distal tibial shaft and metaphyseal fractures. Stable alignment. Evidence of partial healing across the fracture line.  Fractures within the proximal and distal shafts of the left fibula are again noted with stable alignment. No significant healing callus noted.  IMPRESSION: Partial healing within the tibial fractures. Intra medullary nail has a stable appearance.  No significant evidence of healing across the proximal or distal fibular shaft fractures.   Electronically Signed   By: KeRolm Baptise.D.   On: 09/05/2013 16:24   Ct Abdomen Pelvis W Contrast  09/05/2013   CLINICAL DATA:  Pelvic abscess.  EXAM: CT ABDOMEN AND PELVIS WITH CONTRAST  TECHNIQUE: Multidetector CT imaging of the abdomen and pelvis was performed using the standard protocol following bolus administration of intravenous contrast.  CONTRAST:  100106mMNIPAQUE IOHEXOL 300 MG/ML  SOLN  COMPARISON:  CT scan of August 31, 2013.  FINDINGS: Status post interbody fusion of L3-4 and L4-5. Minimal bilateral pleural effusions are noted with adjacent subsegmental atelectasis.  No gallstones are noted. Small hiatal hernia is noted. No focal abnormality is noted in the liver or spleen. Stable dilated pancreatic duct is noted in the pancreatic head. Stable pancreatic cystic lesion measuring 21 x 9 mm is noted in the uncinate process ; it is uncertain if this represents anomalous dilated pancreatic duct. MRI may be performed for further evaluation. Adrenal glands and kidneys appear normal. No hydronephrosis or renal obstruction is noted. Atherosclerotic calcifications of abdominal aorta  are noted without aneurysm formation. Percutaneous drain is again noted in the posterior portion of the pelvis with associated fluid collection measuring 16 x 16 mm which is smaller compared to prior exam. Sigmoid diverticulosis is noted without inflammation. Another fluid collection measuring 22 x 20 mm is noted in the right lower quadrant adjacent to the terminal ileum. This portion the terminal ileum does demonstrate wall thickening consistent with  inflammation. This fluid collection is not significantly changed compared to prior exam and is concerning for possible abscess. Status post right total hip arthroplasty. Stable bilateral inguinal adenopathy.  IMPRESSION: Stable cystic abnormality seen in the uncinate process of the pancreatic head ; it is uncertain if this represents dilated anomalous pancreatic duct or possibly cystic neoplasm. There does appear to be dilatation of the pancreatic duct seen more superiorly in the pancreatic head ; further evaluation with MRI is recommended.  The abscess noted in the posterior portion of the pelvis is smaller compared to prior exam. Continued presence of left transgluteal percutaneous drainage catheter is noted.  Stable 2 cm fluid collection is seen in the right lower quadrant adjacent to terminal ileum which appears to be inflamed with associated wall thickening. This fluid collection is concerning for possible abscess which is stable.   Electronically Signed   By: Sabino Dick M.D.   On: 09/05/2013 16:14   Ct Abdomen Pelvis W Contrast  08/31/2013   CLINICAL DATA:  Recent diverticular abscess  EXAM: CT ABDOMEN AND PELVIS WITH CONTRAST  TECHNIQUE: Multidetector CT imaging of the abdomen and pelvis was performed using the standard protocol following bolus administration of intravenous contrast. Oral contrast was also administered.  CONTRAST:  137m OMNIPAQUE IOHEXOL 300 MG/ML  SOLN  COMPARISON:  August 25, 2013 and August 26, 2013  FINDINGS: There is patchy bibasilar  lung atelectasis. There are bilateral pleural effusions with mild bibasilar consolidation posteriorly. There is a moderate hiatal hernia. There are multiple foci of coronary artery calcification.  There is fatty change in the liver. No focal liver lesions are identified. There is no biliary duct dilatation. Gallbladder wall is not thickened. There is a questionable tiny gallstone within the gallbladder.  Spleen and adrenals appear normal.  There is dilatation of the pancreatic duct in the region of the head and uncinate process measuring 8 mm, a stable finding. A there is no well-defined mass in this area. Elsewhere the pancreas appears unremarkable.  Kidneys bilaterally show no hydronephrosis or calculus on either side. There is a 1 x 1 cm cyst in the mid to lower pole left kidney. There is a 6 x 6 mm cyst in the lower pole the right kidney. There is a 5 x 5 mm cyst in the mid left kidney. No noncystic renal masses are appreciable.  In the pelvis, there is a drain within a sigmoid diverticular abscess. The fluid collection is smaller compared to recent prior study. On the current examination, the abscess measures 4.3 x 2.6 cm. Most of the fluid which currently remains is located slightly posterior and medial to the distal aspect of the drainage catheter. Multiple sigmoid diverticula are present with muscular hypertrophy in the sigmoid colon.  There remains generalized bowel dilatation suggesting ileus. No bowel obstruction is seen. There is no free air or portal venous air. There is mild wall thickening in the anterior left pelvis in the area noted previously. No new abscess is seen. Indeed, the area noted previously that was concerning for small abscess in the anterior right pelvis no longer appears to represent a focal abscess. No extra mural air is appreciable in this area, and all fluid in this area appears to reside within bowel. There is mild dilatation of the right fallopian tube which probably is secondary  to the surrounding inflammation. This is a stable finding.  There is no appreciable ascites or adenopathy in the abdomen or pelvis. There is atherosclerotic change in aorta but  no aneurysm. There is extensive arthropathy in the lumbar spine. There are no blastic or lytic bone lesions. There is a total hip prosthesis on the right. There is postoperative change in the lower lumbar spine with remodeling in the region of L4.  IMPRESSION: The abscess in the lower left pelvis is smaller with drain in place. Most of the remaining fluid and air is slightly posterior and lateral to the drainage catheter tip. It is difficult to say with certainty that this remaining fluid connects at this time to the area that has been decompressed by the catheter.  There is small bowel inflammation with wall thickening in the anterior right pelvis with less wall thickening compared to the previous study. The questionable second abscess seen in this area on the recent prior study is no longer appreciable as a discrete fluid collection with air.  There is evidence of generalized ileus. No bowel obstruction is seen.  There is a moderate hiatal hernia.  There are small pleural effusions with bibasilar consolidation. There is also patchy lung atelectatic change bilaterally.  There is a questionable tiny gallstone seen on axial slice 33 series 2.  There is a cystic area in the head and uncinate process of the pancreas which may represent residua of previous pseudocyst. If there is concern for potential cystic pancreatic mass lesion in this area, MRI pre and post-contrast would be the imaging study of choice for further evaluation of this area.  Postoperative change in the lumbar spine with remodeling.   Electronically Signed   By: Lowella Grip M.D.   On: 08/31/2013 14:26   Ct Abdomen Pelvis W Contrast  08/25/2013   CLINICAL DATA:  Right-sided abdominal pain.  EXAM: CT ABDOMEN AND PELVIS WITH CONTRAST  TECHNIQUE: Multidetector CT imaging of  the abdomen and pelvis was performed using the standard protocol following bolus administration of intravenous contrast.  CONTRAST:  59m OMNIPAQUE IOHEXOL 300 MG/ML  SOLN  COMPARISON:  None.  FINDINGS: There are 2 adjacent loops of thick-walled small bowel in the right anterior pelvis, 1 having a maximum wall thickness of 6 mm and the other 5 mm. There is inflammatory type stranding adjacent to these loops. In addition, a fluid collection containing small bubbles of air tracks along the posterior inferior margin of the more anterior loop. Date tubular fluid collection tracks along the inferior margin of the more inferior and posterior loop.  There is a larger extraluminal fluid collection in the inferior pelvis posterior to the lower uterine segment and to the left of the rectosigmoid. It measures 5.2 cm x 3 cm x 2.2 cm in size. It contain small bubbles of air. This is contiguous with the margin of the rectosigmoid, which may be the origin of this extraluminal collection. There are multiple diverticula noted along the sigmoid colon. This collection may be from a ruptured diverticulum.  There is a small round fluid collection in the right lower quadrant along the posterior peritoneal margin measuring 2 cm in greatest dimension. No air is seen within this collection.  No appendix is seen. However, the findings appear remote from the cecal tip that are most likely not the consequence of acute appendicitis.  There is subsegmental atelectasis at the lung bases. Small moderate hiatal hernia. Heart is mildly enlarged.  Liver, spleen and gallbladder are unremarkable. No bile duct dilation.  There is dilation of the pancreatic duct, maximum of 8 mm in the pancreatic head. This is likely chronic. No pancreatic mass or inflammatory change.  Normal adrenal glands. Kidneys show mild cortical thinning. There is small bilateral low-density renal lesions that are likely cysts. No hydronephrosis. Ureters are unremarkable. No  bladder mass or stone.  Uterus and adnexa are unremarkable other than the adjacent inflammatory changes described above. This  No generalized ascites.  No nondependent free intraperitoneal air.  There are degenerative changes throughout the visualized spine. Bones are demineralized. The right hip prosthesis is well-seated and aligned. There is a mild compression/ burst fracture of L4 that is old. Intervertebral cages have been placed at L3-L4-L4-L5.  IMPRESSION: 1. There are inflammatory changes in the pelvis and right lower quadrant. 2. Two adjacent loops of small bowel in the anterior right pelvis show significant wall thickening, which may be the source of the inflammatory changes-- an infectious or inflammatory ileitis. The wall thickening could potentially be reactive. There are small adjacent extraluminal fluid collections containing small bubbles of air. A larger extraluminal fluid collection lies along the left margin of the rectosigmoid junction. Findings could be due to ruptured diverticulitis and peridiverticular abscess, with secondary reactive edema/inflammation of the loops of small bowel. 3. No appendix is visualized. Ruptured appendix is unlikely but not excluded-- the findings are not centered on the expected location of the appendix. 4. No other acute findings.   Electronically Signed   By: Lajean Manes M.D.   On: 08/25/2013 13:19   Dg Chest Port 1 View  08/27/2013   CLINICAL DATA:  80 year old female - PICC line placement.  EXAM: PORTABLE CHEST - 1 VIEW  COMPARISON:  08/26/2013 and prior chest radiographs dating back to 12/29/2008  FINDINGS: Cardiomegaly identified.  The right PICC line is present with tip overlying the superior cavoatrial junction.  Increasing subsegmental right basilar atelectasis noted.  There is no evidence of focal airspace disease, pulmonary edema, suspicious pulmonary nodule/mass, pleural effusion, or pneumothorax. No acute bony abnormalities are identified.   Degenerative changes in the shoulders again identified.  IMPRESSION: Right PICC line with tip overlying the superior cavoatrial junction.  Increasing subsegmental right basilar atelectasis.   Electronically Signed   By: Hassan Rowan M.D.   On: 08/27/2013 12:06   Dg Chest Port 1 View  08/26/2013   ADDENDUM REPORT: 08/26/2013 16:35  ADDENDUM: The PICC could be retracted 2 cm to place the tip at the cavoatrial junction.   Electronically Signed   By: Logan Bores   On: 08/26/2013 16:35   08/26/2013   CLINICAL DATA:  Line placement.  EXAM: PORTABLE CHEST - 1 VIEW  COMPARISON:  08/25/2013  FINDINGS: There has been interval placement of a right PICC line with tip projecting over the mid to upper right atrium. The cardiac silhouette remains mildly enlarged. The lungs are hypoinflated. Linear opacities in the right midlung is unchanged and may reflect atelectasis or scarring. There is slightly increased prominence of the pulmonary vasculature. No new airspace consolidation, pleural effusion, or pneumothorax is identified. No acute osseous abnormality is seen.  IMPRESSION: Right PICC terminating over the mid to upper right atrium.  Electronically Signed: By: Logan Bores On: 08/26/2013 15:46   Dg Chest Portable 1 View  08/25/2013   CLINICAL DATA:  Chest pain, abdominal pain  EXAM: PORTABLE CHEST - 1 VIEW  COMPARISON:  06/16/2013  FINDINGS: Cardiomegaly is noted. Mild elevation of the right hemidiaphragm. No acute infiltrate or pulmonary edema. Stable atelectasis or scarring in right midlung.  IMPRESSION: Cardiomegaly. Chronic elevation of the right hemidiaphragm. No active disease. Stable atelectasis or scarring right midlung.  Electronically Signed   By: Lahoma Crocker M.D.   On: 08/25/2013 09:54   Dg Abd Portable 1v  08/29/2013   CLINICAL DATA:  Confirm NG tube placement.  EXAM: PORTABLE ABDOMEN - 1 VIEW  COMPARISON:  CT 08/25/2013  FINDINGS: Nasogastric tube courses through the stomach with tip and side-port right of  midline in the right upper quadrant likely over the distal stomach or proximal duodenum. A pigtail drainage catheter seen with tip coiled over the left pelvis over the expected region of patient's diverticular abscess. Mild contrast is present within the colon. There are multiple air-filled mildly dilated small bowel loops more prominent than on the recent CT scout film. Remainder of the exam is unchanged.  IMPRESSION: Multiple air-filled mildly dilated small bowel loops worse compared to the recent CT scout film. Findings may be due to obstruction related to the thick-walled small bowel loop in the right lower quadrant on the recent CT scan versus secondary ileus to patient's presumed diverticulitis.  Nasogastric tube with tip and side-port right of midline in the right upper quadrant likely over the distal stomach or proximal duodenum. Drainage catheter over the left pelvis.   Electronically Signed   By: Marin Olp M.D.   On: 08/29/2013 15:01   Ct Image Guided Drainage By Percutaneous Catheter  08/26/2013   CLINICAL DATA:  Pelvic diverticular abscess  EXAM: CT GUIDED DRAINAGE OF PELVIC DIVERTICULAR ABSCESS  ANESTHESIA/SEDATION: 1.5 Mg IV Versed 50 mcg IV Fentanyl  Total Moderate Sedation Time:  21 minutes  PROCEDURE: The procedure, risks, benefits, and alternatives were explained to the patient. Questions regarding the procedure were encouraged and answered. The patient understands and consents to the procedure.  The left gluteal area was prepped with Betadinein a sterile fashion, and a sterile drape was applied covering the operative field. A sterile gown and sterile gloves were used for the procedure. Local anesthesia was provided with 1% Lidocaine.  Previous imaging reviewed. Patient positioned prone. Noncontrast localization CT performed. The posterior left pelvic abscess was localized. Under sterile conditions and local anesthesia, an 18 gauge 10 cm access needle was advanced percutaneously from a left  trans gluteal approach into the collection. Needle position confirmed with CT. Syringe aspiration yielded fecal contaminated fluid. Guidewire inserted followed by tract dilatation to insert a 10 [place] drain. Drain catheter confirmed in the abscess cavity. 25 cc fecal contaminated fluid aspirated. Catheter secured with a Prolene suture and connected to external suction bulb. Sterile dressing applied. No immediate complication. Patient tolerated the procedure well.  COMPLICATIONS: No immediate  FINDINGS: Imaging confirms needle placement in the left pelvic diverticular abscess for drain placement  IMPRESSION: Successful CT-guided left pelvic diverticular abscess drain insertion. Sample sent for Gram stain culture.   Electronically Signed   By: Daryll Brod M.D.   On: 08/26/2013 14:12       Microbiology: Recent Results (from the past 240 hour(s))  CLOSTRIDIUM DIFFICILE BY PCR     Status: None   Collection Time    08/30/13  6:09 AM      Result Value Ref Range Status   C difficile by pcr NEGATIVE  NEGATIVE Final     Labs: Results for orders placed during the hospital encounter of 08/25/13 (from the past 48 hour(s))  GLUCOSE, CAPILLARY     Status: Abnormal   Collection Time    09/06/13  5:50 PM      Result Value Ref Range   Glucose-Capillary 129 (*) 70 - 99 mg/dL  GLUCOSE,  CAPILLARY     Status: Abnormal   Collection Time    09/06/13  9:24 PM      Result Value Ref Range   Glucose-Capillary 165 (*) 70 - 99 mg/dL   Comment 1 Notify RN    PROTIME-INR     Status: None   Collection Time    09/07/13  4:00 AM      Result Value Ref Range   Prothrombin Time 14.8  11.6 - 15.2 seconds   INR 1.16  0.00 - 1.49  CBC WITH DIFFERENTIAL     Status: Abnormal   Collection Time    09/07/13  4:00 AM      Result Value Ref Range   WBC 7.3  4.0 - 10.5 K/uL   RBC 3.08 (*) 3.87 - 5.11 MIL/uL   Hemoglobin 11.0 (*) 12.0 - 15.0 g/dL   HCT 32.6 (*) 36.0 - 46.0 %   MCV 105.8 (*) 78.0 - 100.0 fL   MCH 35.7 (*)  26.0 - 34.0 pg   MCHC 33.7  30.0 - 36.0 g/dL   RDW 12.8  11.5 - 15.5 %   Platelets 417 (*) 150 - 400 K/uL   Neutrophils Relative % 64  43 - 77 %   Neutro Abs 4.7  1.7 - 7.7 K/uL   Lymphocytes Relative 17  12 - 46 %   Lymphs Abs 1.3  0.7 - 4.0 K/uL   Monocytes Relative 12  3 - 12 %   Monocytes Absolute 0.9  0.1 - 1.0 K/uL   Eosinophils Relative 6 (*) 0 - 5 %   Eosinophils Absolute 0.4  0.0 - 0.7 K/uL   Basophils Relative 1  0 - 1 %   Basophils Absolute 0.0  0.0 - 0.1 K/uL  COMPREHENSIVE METABOLIC PANEL     Status: Abnormal   Collection Time    09/07/13  4:00 AM      Result Value Ref Range   Sodium 135 (*) 137 - 147 mEq/L   Potassium 4.2  3.7 - 5.3 mEq/L   Chloride 99  96 - 112 mEq/L   CO2 23  19 - 32 mEq/L   Glucose, Bld 105 (*) 70 - 99 mg/dL   BUN 12  6 - 23 mg/dL   Creatinine, Ser 0.71  0.50 - 1.10 mg/dL   Calcium 8.7  8.4 - 10.5 mg/dL   Total Protein 6.3  6.0 - 8.3 g/dL   Albumin 2.1 (*) 3.5 - 5.2 g/dL   AST 34  0 - 37 U/L   ALT 43 (*) 0 - 35 U/L   Alkaline Phosphatase 79  39 - 117 U/L   Total Bilirubin 0.4  0.3 - 1.2 mg/dL   GFR calc non Af Amer 76 (*) >90 mL/min   GFR calc Af Amer 89 (*) >90 mL/min   Comment: (NOTE)     The eGFR has been calculated using the CKD EPI equation.     This calculation has not been validated in all clinical situations.     eGFR's persistently <90 mL/min signify possible Chronic Kidney     Disease.   Anion gap 13  5 - 15  MAGNESIUM     Status: None   Collection Time    09/07/13  4:00 AM      Result Value Ref Range   Magnesium 1.6  1.5 - 2.5 mg/dL  GLUCOSE, CAPILLARY     Status: Abnormal   Collection Time    09/07/13  8:15 AM  Result Value Ref Range   Glucose-Capillary 130 (*) 70 - 99 mg/dL  GLUCOSE, CAPILLARY     Status: Abnormal   Collection Time    09/07/13 12:09 PM      Result Value Ref Range   Glucose-Capillary 188 (*) 70 - 99 mg/dL  PROTIME-INR     Status: None   Collection Time    09/08/13  5:45 AM      Result Value  Ref Range   Prothrombin Time 14.4  11.6 - 15.2 seconds   INR 1.12  0.00 - 1.49  CBC WITH DIFFERENTIAL     Status: Abnormal   Collection Time    09/08/13  5:45 AM      Result Value Ref Range   WBC 8.9  4.0 - 10.5 K/uL   RBC 3.33 (*) 3.87 - 5.11 MIL/uL   Hemoglobin 11.4 (*) 12.0 - 15.0 g/dL   HCT 34.1 (*) 36.0 - 46.0 %   MCV 102.4 (*) 78.0 - 100.0 fL   MCH 34.2 (*) 26.0 - 34.0 pg   MCHC 33.4  30.0 - 36.0 g/dL   RDW 12.9  11.5 - 15.5 %   Platelets 455 (*) 150 - 400 K/uL   Neutrophils Relative % 58  43 - 77 %   Lymphocytes Relative 24  12 - 46 %   Monocytes Relative 11  3 - 12 %   Eosinophils Relative 6 (*) 0 - 5 %   Basophils Relative 1  0 - 1 %   Neutro Abs 5.2  1.7 - 7.7 K/uL   Lymphs Abs 2.1  0.7 - 4.0 K/uL   Monocytes Absolute 1.0  0.1 - 1.0 K/uL   Eosinophils Absolute 0.5  0.0 - 0.7 K/uL   Basophils Absolute 0.1  0.0 - 0.1 K/uL   WBC Morphology TOXIC GRANULATION    COMPREHENSIVE METABOLIC PANEL     Status: Abnormal   Collection Time    09/08/13  5:45 AM      Result Value Ref Range   Sodium 137  137 - 147 mEq/L   Potassium 4.0  3.7 - 5.3 mEq/L   Chloride 102  96 - 112 mEq/L   CO2 24  19 - 32 mEq/L   Glucose, Bld 77  70 - 99 mg/dL   BUN 15  6 - 23 mg/dL   Creatinine, Ser 0.79  0.50 - 1.10 mg/dL   Calcium 8.8  8.4 - 10.5 mg/dL   Total Protein 6.5  6.0 - 8.3 g/dL   Albumin 2.3 (*) 3.5 - 5.2 g/dL   AST 27  0 - 37 U/L   ALT 36 (*) 0 - 35 U/L   Alkaline Phosphatase 84  39 - 117 U/L   Total Bilirubin 0.5  0.3 - 1.2 mg/dL   GFR calc non Af Amer 74 (*) >90 mL/min   GFR calc Af Amer 86 (*) >90 mL/min   Comment: (NOTE)     The eGFR has been calculated using the CKD EPI equation.     This calculation has not been validated in all clinical situations.     eGFR's persistently <90 mL/min signify possible Chronic Kidney     Disease.   Anion gap 11  5 - 15  MAGNESIUM     Status: None   Collection Time    09/08/13  5:45 AM      Result Value Ref Range   Magnesium 1.8  1.5 - 2.5  mg/dL  HEMOGLOBIN A1C  Status: Abnormal   Collection Time    09/08/13  5:45 AM      Result Value Ref Range   Hemoglobin A1C 6.1 (*) <5.7 %   Comment: (NOTE)                                                                               According to the ADA Clinical Practice Recommendations for 2011, when     HbA1c is used as a screening test:      >=6.5%   Diagnostic of Diabetes Mellitus               (if abnormal result is confirmed)     5.7-6.4%   Increased risk of developing Diabetes Mellitus     References:Diagnosis and Classification of Diabetes Mellitus,Diabetes     JJOA,4166,06(TKZSW 1):S62-S69 and Standards of Medical Care in             Diabetes - 2011,Diabetes Care,2011,34 (Suppl 1):S11-S61.   Mean Plasma Glucose 128 (*) <117 mg/dL   Comment: Performed at [facility]     Brief Summary  80 y.o. female w/ a past medical history of atrial fibrillation on chronic anticoagulation with Coumadin and recent fall which led to an open fracture of the left leg tibia and fibula requiring surgical intervention and wound care with wound VAC. Patient was at [facility] when she developed LLQ pain. CT scan of the abdomen showed inflammatory changes in the pelvis and right lower quadrant with wall thickening of small bowel adjacent loops concerning for ruptured diverticulitis and abscess. General Surgery was consulted and she was started on IV Cipro and Flagyl, now transitioned to by mouth. She had a drain placed transgluteally to drain a pelvic abscess by IR which was removed on 8/18. Her diet was limited to full liquids with TNA but she is advancing to a regular diet. Her abscess culture grew three bacteria.  For the last week, she has been on zosyn, but she has developed a drug rash so changing to levo/flagyl at recommendation of Dr. Megan Salon.  Her medical course has been complicated by difficult to control RVR in the setting of chronic afib/ a flutter with variable conduction, as  well as numerous electrolyte disturbances. Her rate control has now been much more consistent, and she has been titrated back to her usual oral meds.  Plan to d/c back to [facility] SNF once off TPN, hopefully in the next few days.     Assessment/Plan  Diverticulitis of colon with perforation CT scan concerning for ruptured diverticulitis and abscess  s/p L transgluteal percutaneous drainage in IR 8/7  - Left transgluteal drain removed 8/18, culture showed Escherichia coli Residual Small 2cm abscess identified on CT which is not amenable to drainage  - Diet advanced to soft  Off TPN  -Sequence of antibiotics administered  ;Day 1 cipro/flagyl Day 2-4 Invanz, then Zosyn D#9(?drug reaction). Levaquin/flagyl started 8/18---> now on by mouth levofloxacin and Flagyl for another week Discussed case with Dr. Megan Salon , infectious disease who stated VRE has been treated for 10 days and is less pathogenic than other organisms cultured so okay to stop VRE treatment.  - Abscess cx: pansensitive enterococcus +  pan sensitive E coli + beta-lactamase positive Bacteriodes  Tolerating soft diet. Afebrile. Normal WBC.  -pt will actually follow up with Dr. Redmond Pulling. She will need a followup CT scan 7-10 days and with Dr. Redmond Pulling thereafter. -may discharge from surgical standpoint.  According to the daughter, patient will also need an outpatient colonoscopy  Morbilliform drug rash likely due to zosyn  Improved after administering Benadryl  Sepsis (infection, WBC 25, HR 112), remained hemodynamically stable.  - Sepsis physiology resolved  Repeat CBC in one week  UTI  - Has completed a full course of tx for this issue - appears that urine was not sent for cx  Metabolic acidosis  - Resolved w/ volume resuscitation  Hypokalemia and Hypomagnesemia  - Being managed by pharmacy with TPN   Chronic afib and atrial flutter w/ intermittent RVR, rate controlled  - Continue diltiazem and metoprolol , continue to  uptitrate metoprolol as tolerated - Continue full dose lovenox for anticoag , resume Coumadin when safe from a surgical standpoint  Chronic diastolic CHF EF 30% on echogram in 2012  - currently euvolemic   HTN, stable on dilt and BB  Hypothyroid, stable. Continue synthroid dose.  Hx of Fe deficient anemia  - Fe studies most c/w "anemia of chronic disease" i.e. likely a result of inflammatory state and recent poor nutrition  Macrocytosis, B12 normal/high June 2014 - recheck notes adequate stores (658)    Mechanical fall with left open tibial fracture June 2015, XR demonstrates partial healing within tibial fracture and stable IM nail  Cont current prescribed wound care (followed by Dr. Migdalia Dk)  - wound vac evaluated by Plastics today  - F/u with wound center on Monday   Obesity Unspecified    Diet: soft  Access: PIV and PICC  IVF: TPN DC ,dc accuchecks  Code Status: DNR  Family Communication: patient alone  Disposition Plan: To [facility] SNF in AM ,off TPN, tolerating oral abx,  Consultants:  Gen Surgery  IR  Antibiotics:  Cipro 8/06  Flagyl 8/06  Ertapenem 8/07 >>8/9  Zosyn 8/9 >> 8/18  Levo 8/18 >>  Flagyl 8/18 >>  DVT prophylaxis:  lovenox       Blood pressure 120/53, pulse 89, temperature 98.6 F (37 C), temperature source Oral, resp. rate 17, height 5' 3" (1.6 m), weight 83.2 kg (183 lb 6.8 oz), SpO2 96.00%.   General: alert & oriented x 3 In NAD  Cardiovascular: RRR, nl S1 s2  Respiratory: Decreased breath sounds at the bases, scattered rhonchi, no crackles  Abdomen: NABS, soft, NT/ND  MSK: Normal tone and bulk, wound vac in place with brace on LLE, serosanguinous drainage. Did not remove dressing on leg. Dressing on buttock c/d/i (drain removed)  Neuro: Grossly intact  Skin: Patient scratching at morbilliform rash most prominent on back and chest but also present on shoulders/upper arms and abdomen. Also has separate erythematous papular rash under  breasts       Discharge Instructions   Diet - low sodium heart healthy    Complete by:  As directed      Increase activity slowly    Complete by:  As directed            Follow-up Information   Follow up with Walker Kehr, MD. Schedule an appointment as soon as possible for a visit in 1 week.   Specialty:  Internal Medicine   Contact information:   [place] [address] [phone]  SignedReyne Dumas 09/08/2013, 12:35 PM

## 2013-09-08 NOTE — Progress Notes (Signed)
Report given to [place] and provided my contact number for any further question.

## 2013-09-09 DIAGNOSIS — K5732 Diverticulitis of large intestine without perforation or abscess without bleeding: Secondary | ICD-10-CM | POA: Diagnosis not present

## 2013-09-09 DIAGNOSIS — I4891 Unspecified atrial fibrillation: Secondary | ICD-10-CM | POA: Diagnosis not present

## 2013-09-09 DIAGNOSIS — S82209A Unspecified fracture of shaft of unspecified tibia, initial encounter for closed fracture: Secondary | ICD-10-CM | POA: Diagnosis not present

## 2013-09-09 LAB — PROTIME-INR: INR: 1.2 — AB (ref 0.9–1.1)

## 2013-09-12 DIAGNOSIS — L97809 Non-pressure chronic ulcer of other part of unspecified lower leg with unspecified severity: Secondary | ICD-10-CM | POA: Diagnosis not present

## 2013-09-12 DIAGNOSIS — I872 Venous insufficiency (chronic) (peripheral): Secondary | ICD-10-CM | POA: Diagnosis not present

## 2013-09-12 NOTE — Progress Notes (Cosign Needed)
[facility]  NAME:  [name], SHANDS NO.:  [id number]  MEDICAL RECORD NO.:  [id number]      DATE OF BIRTH:  06/16/1925  PHYSICIAN:  Theodoro Kos, DO       VISIT DATE:  09/12/2013                                  OFFICE VISIT   HISTORY OF PRESENT ILLNESS:  The patient is an 80 year old female, who is here for followup on her left leg traumatic ulcer now, chronic venous insufficiency ulcer, she is doing extremely well.  The bone is completely covered and she is just about fresh with the skin, except for one area about 2 mm in size.  REVIEW OF SYSTEMS:  She was recently admitted for what sounds like a diverticulitis that had perforated and she had an abscess, a drain was placed and that seems to be resolving well.  She is still at the nursing facility.  She is not a smoker.  She has very good family support.  PHYSICAL EXAMINATION:  GENERAL:  On exam, she is alert, oriented, cooperative, not in any distress.  She is very pleasant.  Her breathing is unlabored. HEART:  Her heart rate is regular. SKIN:  The wound is clean.  No sign of infection.  Overall seems to be healing extremely well.  Pulses present and regular.  Endoform was placed with the [facility] and we will see her back in 1 week.     Theodoro Kos, DO     CS/MEDQ  D:  09/12/2013  T:  09/12/2013  Job:  [id number]

## 2013-09-15 ENCOUNTER — Other Ambulatory Visit: Payer: Self-pay | Admitting: Internal Medicine

## 2013-09-15 DIAGNOSIS — Z8719 Personal history of other diseases of the digestive system: Secondary | ICD-10-CM

## 2013-09-15 LAB — PROTIME-INR: INR: 2.4 — AB (ref 0.9–1.1)

## 2013-09-18 LAB — PROTIME-INR: INR: 2.9 — AB (ref 0.9–1.1)

## 2013-09-19 DIAGNOSIS — I872 Venous insufficiency (chronic) (peripheral): Secondary | ICD-10-CM | POA: Diagnosis not present

## 2013-09-19 DIAGNOSIS — L97809 Non-pressure chronic ulcer of other part of unspecified lower leg with unspecified severity: Secondary | ICD-10-CM | POA: Diagnosis not present

## 2013-09-20 NOTE — Progress Notes (Signed)
[facility]  NAME:  [name], [name]                      ACCOUNT NO.:  MEDICAL RECORD NO.:  [id number]      DATE OF BIRTH:  1925-06-13  PHYSICIAN:  Theodoro Kos, DO       VISIT DATE:  09/19/2013                                  OFFICE VISIT   The patient is an 80 year old who is here for followup on her left leg ulcer chronic venous insufficiency secondary to trauma.  She is doing extremely well and healing in the area very well.  She still has quite a bit of varicose veins but it is showing excellent signs of granulation. She is still at a facility.  She was discharged from the hospital with diverticulitis and overall doing very well.  She is alert, oriented, cooperative, not in any distress.  She is accompanied by her daughter. She is pleasant.  The area does not appear to be infected.  It shows really good signs of granulation tissue.  No drainage and now we just need some epithelialization.  Silver nitrate was used with an Endoform. I will continue with the VAC.     Theodoro Kos, DO     CS/MEDQ  D:  09/19/2013  T:  09/20/2013  Job:  [id number]

## 2013-09-23 ENCOUNTER — Ambulatory Visit
Admission: RE | Admit: 2013-09-23 | Discharge: 2013-09-23 | Disposition: A | Discharge status: Home / Self Care | Payer: Medicare Other | Source: Ambulatory Visit | Attending: Internal Medicine | Admitting: Internal Medicine

## 2013-09-23 DIAGNOSIS — Z8719 Personal history of other diseases of the digestive system: Secondary | ICD-10-CM

## 2013-09-23 DIAGNOSIS — K838 Other specified diseases of biliary tract: Secondary | ICD-10-CM | POA: Diagnosis not present

## 2013-09-23 DIAGNOSIS — R198 Other specified symptoms and signs involving the digestive system and abdomen: Secondary | ICD-10-CM | POA: Diagnosis not present

## 2013-09-23 DIAGNOSIS — K6389 Other specified diseases of intestine: Secondary | ICD-10-CM | POA: Diagnosis not present

## 2013-09-23 LAB — PROTIME-INR: INR: 2.9 — AB (ref 0.9–1.1)

## 2013-09-23 MED ORDER — IOHEXOL 300 MG/ML  SOLN
100.0000 mL | Freq: Once | INTRAMUSCULAR | Status: AC | PRN
  Administered 2013-09-23: 100 mL via INTRAVENOUS

## 2013-09-24 DIAGNOSIS — R21 Rash and other nonspecific skin eruption: Secondary | ICD-10-CM | POA: Diagnosis not present

## 2013-10-03 ENCOUNTER — Ambulatory Visit (HOSPITAL_COMMUNITY)
Admission: RE | Admit: 2013-10-03 | Discharge: 2013-10-03 | Disposition: A | Discharge status: Home / Self Care | Payer: No Typology Code available for payment source | Source: Ambulatory Visit | Attending: Plastic Surgery | Admitting: Plastic Surgery

## 2013-10-03 ENCOUNTER — Encounter (HOSPITAL_BASED_OUTPATIENT_CLINIC_OR_DEPARTMENT_OTHER): Payer: Medicare Other | Attending: Plastic Surgery

## 2013-10-03 ENCOUNTER — Other Ambulatory Visit (HOSPITAL_COMMUNITY): Payer: Self-pay | Admitting: Plastic Surgery

## 2013-10-03 DIAGNOSIS — IMO0001 Reserved for inherently not codable concepts without codable children: Secondary | ICD-10-CM | POA: Insufficient documentation

## 2013-10-03 DIAGNOSIS — L97909 Non-pressure chronic ulcer of unspecified part of unspecified lower leg with unspecified severity: Secondary | ICD-10-CM | POA: Insufficient documentation

## 2013-10-03 DIAGNOSIS — S82899B Other fracture of unspecified lower leg, initial encounter for open fracture type I or II: Secondary | ICD-10-CM | POA: Diagnosis not present

## 2013-10-03 DIAGNOSIS — I872 Venous insufficiency (chronic) (peripheral): Secondary | ICD-10-CM | POA: Insufficient documentation

## 2013-10-03 DIAGNOSIS — Z9889 Other specified postprocedural states: Secondary | ICD-10-CM | POA: Insufficient documentation

## 2013-10-03 DIAGNOSIS — S8290XD Unspecified fracture of unspecified lower leg, subsequent encounter for closed fracture with routine healing: Secondary | ICD-10-CM | POA: Diagnosis not present

## 2013-10-03 DIAGNOSIS — T148XXA Other injury of unspecified body region, initial encounter: Secondary | ICD-10-CM

## 2013-10-04 NOTE — Progress Notes (Signed)
[facility]  NAME:  KAYRON, KALMAR NO.:  [id number]  MEDICAL RECORD NO.:  [id number]      DATE OF BIRTH:  10-Oct-1925  PHYSICIAN:  Theodoro Kos, DO       VISIT DATE:  10/03/2013                                  OFFICE VISIT   The patient is an 80 year old female who is here for followup on her left lower extremity chronic venous insufficiency ulcer, but secondary to trauma.  She is still in a nursing facility and has been using the [place].  There is no change in her medications or social history otherwise. She has a lot of irritation around the periwound area with some skin breakdown.  There may have been increased drainage that got under the tape and the dressing.  I am going to give her a 1-week holiday from the [facility], but after she would still bring the supplies with her next week. We did Endoform on the actual wound and then alginate around and have them change it Wednesday, Friday, and we will see her back on Monday.  I also gave her doxycycline because I am little concerned about the redness.  We will check an x-ray and prealbumin, and she is to continue with the protein.     Theodoro Kos, DO     CS/MEDQ  D:  10/03/2013  T:  10/04/2013  Job:  [id number]

## 2013-10-05 ENCOUNTER — Encounter (INDEPENDENT_AMBULATORY_CARE_PROVIDER_SITE_OTHER): Payer: Medicare Other | Admitting: General Surgery

## 2013-10-05 DIAGNOSIS — K5732 Diverticulitis of large intestine without perforation or abscess without bleeding: Secondary | ICD-10-CM | POA: Diagnosis not present

## 2013-10-05 DIAGNOSIS — K630 Abscess of intestine: Secondary | ICD-10-CM | POA: Diagnosis not present

## 2013-10-07 LAB — PROTIME-INR: INR: 3.7 — AB (ref 0.9–1.1)

## 2013-10-10 DIAGNOSIS — L97909 Non-pressure chronic ulcer of unspecified part of unspecified lower leg with unspecified severity: Secondary | ICD-10-CM | POA: Diagnosis not present

## 2013-10-10 DIAGNOSIS — I872 Venous insufficiency (chronic) (peripheral): Secondary | ICD-10-CM | POA: Diagnosis not present

## 2013-10-11 NOTE — Progress Notes (Signed)
[facility]  NAME:  [name], [name]                      ACCOUNT NO.:  MEDICAL RECORD NO.:  [id number]      DATE OF BIRTH:  14-Jul-1925  PHYSICIAN:  Theodoro Kos, DO            VISIT DATE:                                  OFFICE VISIT   The patient is an 80 year old female who is here for followup on her left leg ulcer, after trauma, chronic venous insufficiency ulcer at this time.  She is doing extremely well and has completely filled in the defect, now we are just waiting for the epithelialization.  There is no change in her medications.  SOCIAL HISTORY:  She is still at the facility.  She is alert and oriented, and cooperative.  Her daughter and her husband are with her. The wound is markedly improved.  The redness and periwound irritation has improved as well.  Recommendation is for endo-form elevation, multivitamin, vitamin C, zinc, protein increase.  Her pre-albumin was in the 15 range, so we did talk about this and the need for her to improve her protein intake and she is going to try, and we will see her back in a week.     Theodoro Kos, DO     CS/MEDQ  D:  10/10/2013  T:  10/11/2013  Job:  [id number]

## 2013-10-14 ENCOUNTER — Telehealth: Payer: Self-pay | Admitting: Internal Medicine

## 2013-10-14 NOTE — Telephone Encounter (Signed)
Received 2 pages from [facility], sent to Dr. Alain [name] 10/14/13/ss.

## 2013-10-16 DIAGNOSIS — B354 Tinea corporis: Secondary | ICD-10-CM | POA: Diagnosis not present

## 2013-10-17 DIAGNOSIS — S82839B Other fracture of upper and lower end of unspecified fibula, initial encounter for open fracture type I or II: Secondary | ICD-10-CM | POA: Diagnosis not present

## 2013-10-17 DIAGNOSIS — I872 Venous insufficiency (chronic) (peripheral): Secondary | ICD-10-CM | POA: Diagnosis not present

## 2013-10-17 DIAGNOSIS — I4891 Unspecified atrial fibrillation: Secondary | ICD-10-CM | POA: Diagnosis not present

## 2013-10-17 DIAGNOSIS — S82109B Unspecified fracture of upper end of unspecified tibia, initial encounter for open fracture type I or II: Secondary | ICD-10-CM | POA: Diagnosis not present

## 2013-10-17 DIAGNOSIS — L97909 Non-pressure chronic ulcer of unspecified part of unspecified lower leg with unspecified severity: Secondary | ICD-10-CM | POA: Diagnosis not present

## 2013-10-17 DIAGNOSIS — M6281 Muscle weakness (generalized): Secondary | ICD-10-CM | POA: Diagnosis not present

## 2013-10-17 LAB — PROTIME-INR: INR: 10 — AB (ref 0.9–1.1)

## 2013-10-18 DIAGNOSIS — S82839B Other fracture of upper and lower end of unspecified fibula, initial encounter for open fracture type I or II: Secondary | ICD-10-CM | POA: Diagnosis not present

## 2013-10-18 DIAGNOSIS — M6281 Muscle weakness (generalized): Secondary | ICD-10-CM | POA: Diagnosis not present

## 2013-10-18 DIAGNOSIS — S82109B Unspecified fracture of upper end of unspecified tibia, initial encounter for open fracture type I or II: Secondary | ICD-10-CM | POA: Diagnosis not present

## 2013-10-19 DIAGNOSIS — M6281 Muscle weakness (generalized): Secondary | ICD-10-CM | POA: Diagnosis not present

## 2013-10-19 DIAGNOSIS — S82109B Unspecified fracture of upper end of unspecified tibia, initial encounter for open fracture type I or II: Secondary | ICD-10-CM | POA: Diagnosis not present

## 2013-10-19 DIAGNOSIS — I4891 Unspecified atrial fibrillation: Secondary | ICD-10-CM | POA: Diagnosis not present

## 2013-10-19 DIAGNOSIS — S82839B Other fracture of upper and lower end of unspecified fibula, initial encounter for open fracture type I or II: Secondary | ICD-10-CM | POA: Diagnosis not present

## 2013-10-19 DIAGNOSIS — S82209B Unspecified fracture of shaft of unspecified tibia, initial encounter for open fracture type I or II: Secondary | ICD-10-CM | POA: Diagnosis not present

## 2013-10-19 LAB — PROTIME-INR: INR: 2.4 — AB (ref 0.9–1.1)

## 2013-10-20 DIAGNOSIS — M6281 Muscle weakness (generalized): Secondary | ICD-10-CM | POA: Diagnosis not present

## 2013-10-20 DIAGNOSIS — S82102S Unspecified fracture of upper end of left tibia, sequela: Secondary | ICD-10-CM | POA: Diagnosis not present

## 2013-10-20 DIAGNOSIS — S82102B Unspecified fracture of upper end of left tibia, initial encounter for open fracture type I or II: Secondary | ICD-10-CM | POA: Diagnosis not present

## 2013-10-20 NOTE — Progress Notes (Signed)
[facility]  NAME:  [name], [name] NO.:  [id number]  MEDICAL RECORD NO.:  [id number]      DATE OF BIRTH:  [birth year]  PHYSICIAN:  Theodoro Kos, DO       VISIT DATE:  10/17/2013                                  OFFICE VISIT   The patient is an 80 year old female who is here for followup on her left leg chronic ulcers secondary to trauma.  She is showing great signs of improvement and healing.  There is no change in her medication or social history.  She is still at a facility.  Her family is still very involved in her care and here with her today.  She is alert, oriented, cooperative, not in any distress.  She is very pleasant.  She does feel encouraged with her progress.  Her breathing is unlabored.  Her heart rate is regular.  The wound sizes are noted in the chart and show marked improvement. She is starting to epithelialize. The wound is flushed with the rest of the skin.  An Endoform was placed last week and another 1 today. We will continue with the elevation, protein, multivitamin vitamin C, zinc, and followup in 1 week.     Theodoro Kos, DO     CS/MEDQ  D:  10/17/2013  T:  10/18/2013  Job:  [id number]

## 2013-10-21 DIAGNOSIS — I4891 Unspecified atrial fibrillation: Secondary | ICD-10-CM | POA: Diagnosis not present

## 2013-10-21 DIAGNOSIS — B354 Tinea corporis: Secondary | ICD-10-CM | POA: Diagnosis not present

## 2013-10-21 LAB — PROTIME-INR: INR: 2 — AB (ref 0.9–1.1)

## 2013-10-24 ENCOUNTER — Encounter (HOSPITAL_BASED_OUTPATIENT_CLINIC_OR_DEPARTMENT_OTHER): Payer: Medicare Other | Attending: Plastic Surgery

## 2013-10-24 DIAGNOSIS — L97929 Non-pressure chronic ulcer of unspecified part of left lower leg with unspecified severity: Secondary | ICD-10-CM | POA: Insufficient documentation

## 2013-10-24 NOTE — Progress Notes (Signed)
[facility]  NAME:  AMIL, MOSEMAN NO.:  [id number]  MEDICAL RECORD NO.:  [id number]      DATE OF BIRTH:  26-Dec-1925  PHYSICIAN:  Theodoro Kos, DO       VISIT DATE:  10/24/2013                                  OFFICE VISIT   The patient is an 80 year old female who is here for followup on her left lower extremity chronic ulcer secondary to trauma from a fracture, open.  We have been using Endoform after debridement and placement of ACell.  She is in a facility.  She has been cleared by Ortho for 50% weightbearing.  There was no change in medications or social history.  On exam, she is alert, oriented, and cooperative.  She is not in any acute distress.  She is encouraged with her progress.  Pupils are equal. Breathing is unlabored.  Heart rate is regular.  There is no sign of infection.  She has a little bit of swelling likely related to her increase in activity, but no cellulitis.  The wound is smaller in size and those notes are noted.  We will continue with the Endoform, elevation, multivitamin, vitamin C, zinc, protein, offloading as much as possible  when not in PT and we will see her back in 1 week.  She may shower and she has enough Endoform to do changes.     Theodoro Kos, DO     CS/MEDQ  D:  10/24/2013  T:  10/24/2013  Job:  [id number]

## 2013-10-30 DIAGNOSIS — Z7901 Long term (current) use of anticoagulants: Secondary | ICD-10-CM | POA: Diagnosis not present

## 2013-10-30 LAB — PROTIME-INR: INR: 2 — AB (ref 0.9–1.1)

## 2013-10-31 DIAGNOSIS — L97929 Non-pressure chronic ulcer of unspecified part of left lower leg with unspecified severity: Secondary | ICD-10-CM | POA: Diagnosis not present

## 2013-11-01 NOTE — Progress Notes (Signed)
[facility]  NAME:  [name], [name] NO.:  [id number]  MEDICAL RECORD NO.:  [id number]      DATE OF BIRTH:  31-Jan-1925  PHYSICIAN:  Theodoro Kos, DO       VISIT DATE:  10/31/2013                                  OFFICE VISIT   The patient is an 80 year old female who is here for followup on her left leg chronic ulcer after trauma.  She is doing extremely well, healing the area, it is much smaller than it had been.  She is alert, oriented, cooperative, not in any distress.  She is pleasant.  She has been sized for the bone stimulator, she is with her daughter and her husband.  There is no sign of infection.  There is a little bit of skin breakdown, but it is improving, and the periwound area is improving as well.  The wound is a little smaller, it is completely filled in, and is starting to epithelialize.  This is very encouraging.  We will continue with the Endoform and have her follow up in 2 weeks.     Theodoro Kos, DO     CS/MEDQ  D:  10/31/2013  T:  11/01/2013  Job:  [id number]

## 2013-11-03 DIAGNOSIS — S82202E Unspecified fracture of shaft of left tibia, subsequent encounter for open fracture type I or II with routine healing: Secondary | ICD-10-CM | POA: Diagnosis not present

## 2013-11-07 DIAGNOSIS — G4700 Insomnia, unspecified: Secondary | ICD-10-CM | POA: Diagnosis not present

## 2013-11-09 DIAGNOSIS — I82409 Acute embolism and thrombosis of unspecified deep veins of unspecified lower extremity: Secondary | ICD-10-CM | POA: Diagnosis not present

## 2013-11-09 LAB — PROTIME-INR: INR: 1.7 — AB (ref 0.9–1.1)

## 2013-11-14 DIAGNOSIS — L97929 Non-pressure chronic ulcer of unspecified part of left lower leg with unspecified severity: Secondary | ICD-10-CM | POA: Diagnosis not present

## 2013-11-20 DIAGNOSIS — S82102S Unspecified fracture of upper end of left tibia, sequela: Secondary | ICD-10-CM | POA: Diagnosis not present

## 2013-11-20 DIAGNOSIS — R269 Unspecified abnormalities of gait and mobility: Secondary | ICD-10-CM | POA: Diagnosis not present

## 2013-11-20 DIAGNOSIS — M6281 Muscle weakness (generalized): Secondary | ICD-10-CM | POA: Diagnosis not present

## 2013-11-21 DIAGNOSIS — R269 Unspecified abnormalities of gait and mobility: Secondary | ICD-10-CM | POA: Diagnosis not present

## 2013-11-21 DIAGNOSIS — M6281 Muscle weakness (generalized): Secondary | ICD-10-CM | POA: Diagnosis not present

## 2013-11-21 DIAGNOSIS — S82102S Unspecified fracture of upper end of left tibia, sequela: Secondary | ICD-10-CM | POA: Diagnosis not present

## 2013-11-22 DIAGNOSIS — S82102S Unspecified fracture of upper end of left tibia, sequela: Secondary | ICD-10-CM | POA: Diagnosis not present

## 2013-11-22 DIAGNOSIS — R269 Unspecified abnormalities of gait and mobility: Secondary | ICD-10-CM | POA: Diagnosis not present

## 2013-11-22 DIAGNOSIS — M6281 Muscle weakness (generalized): Secondary | ICD-10-CM | POA: Diagnosis not present

## 2013-11-23 DIAGNOSIS — I4891 Unspecified atrial fibrillation: Secondary | ICD-10-CM | POA: Diagnosis not present

## 2013-11-23 DIAGNOSIS — S82102S Unspecified fracture of upper end of left tibia, sequela: Secondary | ICD-10-CM | POA: Diagnosis not present

## 2013-11-23 DIAGNOSIS — M6281 Muscle weakness (generalized): Secondary | ICD-10-CM | POA: Diagnosis not present

## 2013-11-23 DIAGNOSIS — S80812A Abrasion, left lower leg, initial encounter: Secondary | ICD-10-CM | POA: Diagnosis not present

## 2013-11-23 DIAGNOSIS — R269 Unspecified abnormalities of gait and mobility: Secondary | ICD-10-CM | POA: Diagnosis not present

## 2013-11-23 LAB — PROTIME-INR: INR: 1.4 — AB (ref 0.9–1.1)

## 2013-11-24 DIAGNOSIS — S82102S Unspecified fracture of upper end of left tibia, sequela: Secondary | ICD-10-CM | POA: Diagnosis not present

## 2013-11-24 DIAGNOSIS — R269 Unspecified abnormalities of gait and mobility: Secondary | ICD-10-CM | POA: Diagnosis not present

## 2013-11-24 DIAGNOSIS — M6281 Muscle weakness (generalized): Secondary | ICD-10-CM | POA: Diagnosis not present

## 2013-11-25 DIAGNOSIS — M6281 Muscle weakness (generalized): Secondary | ICD-10-CM | POA: Diagnosis not present

## 2013-11-25 DIAGNOSIS — F418 Other specified anxiety disorders: Secondary | ICD-10-CM | POA: Diagnosis not present

## 2013-11-25 DIAGNOSIS — J069 Acute upper respiratory infection, unspecified: Secondary | ICD-10-CM | POA: Diagnosis not present

## 2013-11-25 DIAGNOSIS — S82102S Unspecified fracture of upper end of left tibia, sequela: Secondary | ICD-10-CM | POA: Diagnosis not present

## 2013-11-25 DIAGNOSIS — R269 Unspecified abnormalities of gait and mobility: Secondary | ICD-10-CM | POA: Diagnosis not present

## 2013-11-25 DIAGNOSIS — S82202E Unspecified fracture of shaft of left tibia, subsequent encounter for open fracture type I or II with routine healing: Secondary | ICD-10-CM | POA: Diagnosis not present

## 2013-11-26 DIAGNOSIS — R269 Unspecified abnormalities of gait and mobility: Secondary | ICD-10-CM | POA: Diagnosis not present

## 2013-11-26 DIAGNOSIS — S82102S Unspecified fracture of upper end of left tibia, sequela: Secondary | ICD-10-CM | POA: Diagnosis not present

## 2013-11-26 DIAGNOSIS — M6281 Muscle weakness (generalized): Secondary | ICD-10-CM | POA: Diagnosis not present

## 2013-11-27 DIAGNOSIS — M6281 Muscle weakness (generalized): Secondary | ICD-10-CM | POA: Diagnosis not present

## 2013-11-27 DIAGNOSIS — S82102S Unspecified fracture of upper end of left tibia, sequela: Secondary | ICD-10-CM | POA: Diagnosis not present

## 2013-11-27 DIAGNOSIS — R269 Unspecified abnormalities of gait and mobility: Secondary | ICD-10-CM | POA: Diagnosis not present

## 2013-11-28 DIAGNOSIS — S82202E Unspecified fracture of shaft of left tibia, subsequent encounter for open fracture type I or II with routine healing: Secondary | ICD-10-CM | POA: Diagnosis not present

## 2013-11-28 DIAGNOSIS — F411 Generalized anxiety disorder: Secondary | ICD-10-CM | POA: Diagnosis not present

## 2013-11-28 DIAGNOSIS — S82102S Unspecified fracture of upper end of left tibia, sequela: Secondary | ICD-10-CM | POA: Diagnosis not present

## 2013-11-28 DIAGNOSIS — M6281 Muscle weakness (generalized): Secondary | ICD-10-CM | POA: Diagnosis not present

## 2013-11-28 DIAGNOSIS — R269 Unspecified abnormalities of gait and mobility: Secondary | ICD-10-CM | POA: Diagnosis not present

## 2013-11-28 DIAGNOSIS — L03116 Cellulitis of left lower limb: Secondary | ICD-10-CM | POA: Diagnosis not present

## 2013-11-29 ENCOUNTER — Other Ambulatory Visit: Payer: Self-pay | Admitting: Orthopedic Surgery

## 2013-11-29 ENCOUNTER — Inpatient Hospital Stay: Admission: RE | Admit: 2013-11-29 | Payer: Medicare Other | Source: Ambulatory Visit

## 2013-11-29 DIAGNOSIS — L03116 Cellulitis of left lower limb: Secondary | ICD-10-CM

## 2013-11-29 DIAGNOSIS — B999 Unspecified infectious disease: Secondary | ICD-10-CM

## 2013-11-30 ENCOUNTER — Ambulatory Visit
Admission: RE | Admit: 2013-11-30 | Discharge: 2013-11-30 | Disposition: A | Discharge status: Home / Self Care | Payer: Medicare Other | Source: Ambulatory Visit | Attending: Orthopedic Surgery | Admitting: Orthopedic Surgery

## 2013-11-30 DIAGNOSIS — I10 Essential (primary) hypertension: Secondary | ICD-10-CM | POA: Diagnosis not present

## 2013-11-30 DIAGNOSIS — L03116 Cellulitis of left lower limb: Secondary | ICD-10-CM

## 2013-11-30 DIAGNOSIS — B999 Unspecified infectious disease: Secondary | ICD-10-CM

## 2013-12-01 DIAGNOSIS — L03116 Cellulitis of left lower limb: Secondary | ICD-10-CM | POA: Diagnosis not present

## 2013-12-05 ENCOUNTER — Telehealth: Payer: Self-pay | Admitting: *Deleted

## 2013-12-05 ENCOUNTER — Telehealth: Payer: Self-pay | Admitting: Internal Medicine

## 2013-12-05 DIAGNOSIS — M6281 Muscle weakness (generalized): Secondary | ICD-10-CM | POA: Diagnosis not present

## 2013-12-05 DIAGNOSIS — R269 Unspecified abnormalities of gait and mobility: Secondary | ICD-10-CM | POA: Diagnosis not present

## 2013-12-05 DIAGNOSIS — S82102S Unspecified fracture of upper end of left tibia, sequela: Secondary | ICD-10-CM | POA: Diagnosis not present

## 2013-12-05 NOTE — Telephone Encounter (Signed)
[place] Triage Call Report Triage Record Num: [id number] Operator: Leota Sauers Patient Name: [name] Call Date & Time: 12/03/2013 5:06:11PM Patient Phone: [phone] PCP: Walker Kehr Patient Gender: Female PCP Fax : [phone] Patient DOB: 1925-07-28 Practice Name: [facility] Reason for Call: Caller: [place] Supervisor; PCP: Walker Kehr (Adults only); CB#: [phone]; Call regarding Need RX orders; Caller reports Clindamycin 150mg  caps sig 3 TID started 11/25/13 until follow up with Dr. Marlou Sa at [facility]. She also asks for Rx order for Restoril 15 mg; she has 3 or 4 tablets remaining for same. Per Nursing judgment and Medication Questions guideline advised to follow up with Dr. Marlou Sa at [facility] regarding Clindamycin Rx since she has had appointment with him in the interim. Advised to follow up during regular hours related to concerns related to Restoril. Protocol(s) Used: Medication Questions - Adult Recommended Outcome per Protocol: Call Dispensing Pharmacy or Provider Immediately Reason for Outcome: Unable to obtain prescribed medication related to available resources AND situation poses immediate clinical risk Care Advice: ~ 11/

## 2013-12-05 NOTE — Telephone Encounter (Signed)
Patient would like to know if Dr. Camila Li received anything from [facility] in regards to physical therapy.  Please advise.

## 2013-12-05 NOTE — Telephone Encounter (Signed)
As I recall - I OK'd it last wk Thx

## 2013-12-06 DIAGNOSIS — M6281 Muscle weakness (generalized): Secondary | ICD-10-CM | POA: Diagnosis not present

## 2013-12-06 DIAGNOSIS — R269 Unspecified abnormalities of gait and mobility: Secondary | ICD-10-CM | POA: Diagnosis not present

## 2013-12-06 DIAGNOSIS — S82102S Unspecified fracture of upper end of left tibia, sequela: Secondary | ICD-10-CM | POA: Diagnosis not present

## 2013-12-06 NOTE — Telephone Encounter (Signed)
Notified pt with md response. Pt states [facility] suppose to have resent form back yesterday because they stated they never received. Inform pt will check with his assistant to see if they fax forms...Johny Chess

## 2013-12-07 DIAGNOSIS — M6281 Muscle weakness (generalized): Secondary | ICD-10-CM | POA: Diagnosis not present

## 2013-12-07 DIAGNOSIS — R269 Unspecified abnormalities of gait and mobility: Secondary | ICD-10-CM | POA: Diagnosis not present

## 2013-12-07 DIAGNOSIS — S82102S Unspecified fracture of upper end of left tibia, sequela: Secondary | ICD-10-CM | POA: Diagnosis not present

## 2013-12-07 DIAGNOSIS — I4891 Unspecified atrial fibrillation: Secondary | ICD-10-CM | POA: Diagnosis not present

## 2013-12-07 LAB — PROTIME-INR: INR: 2.5 — AB (ref 0.9–1.1)

## 2013-12-08 DIAGNOSIS — R269 Unspecified abnormalities of gait and mobility: Secondary | ICD-10-CM | POA: Diagnosis not present

## 2013-12-08 DIAGNOSIS — M6281 Muscle weakness (generalized): Secondary | ICD-10-CM | POA: Diagnosis not present

## 2013-12-08 DIAGNOSIS — S82102S Unspecified fracture of upper end of left tibia, sequela: Secondary | ICD-10-CM | POA: Diagnosis not present

## 2013-12-09 DIAGNOSIS — S82102S Unspecified fracture of upper end of left tibia, sequela: Secondary | ICD-10-CM | POA: Diagnosis not present

## 2013-12-09 DIAGNOSIS — R269 Unspecified abnormalities of gait and mobility: Secondary | ICD-10-CM | POA: Diagnosis not present

## 2013-12-09 DIAGNOSIS — M6281 Muscle weakness (generalized): Secondary | ICD-10-CM | POA: Diagnosis not present

## 2013-12-11 DIAGNOSIS — R269 Unspecified abnormalities of gait and mobility: Secondary | ICD-10-CM | POA: Diagnosis not present

## 2013-12-11 DIAGNOSIS — M6281 Muscle weakness (generalized): Secondary | ICD-10-CM | POA: Diagnosis not present

## 2013-12-11 DIAGNOSIS — S82102S Unspecified fracture of upper end of left tibia, sequela: Secondary | ICD-10-CM | POA: Diagnosis not present

## 2013-12-12 DIAGNOSIS — S82202K Unspecified fracture of shaft of left tibia, subsequent encounter for closed fracture with nonunion: Secondary | ICD-10-CM | POA: Diagnosis not present

## 2013-12-12 DIAGNOSIS — M6281 Muscle weakness (generalized): Secondary | ICD-10-CM | POA: Diagnosis not present

## 2013-12-12 DIAGNOSIS — R269 Unspecified abnormalities of gait and mobility: Secondary | ICD-10-CM | POA: Diagnosis not present

## 2013-12-12 DIAGNOSIS — S82102S Unspecified fracture of upper end of left tibia, sequela: Secondary | ICD-10-CM | POA: Diagnosis not present

## 2013-12-13 DIAGNOSIS — S82102S Unspecified fracture of upper end of left tibia, sequela: Secondary | ICD-10-CM | POA: Diagnosis not present

## 2013-12-13 DIAGNOSIS — J189 Pneumonia, unspecified organism: Secondary | ICD-10-CM | POA: Diagnosis not present

## 2013-12-13 DIAGNOSIS — D509 Iron deficiency anemia, unspecified: Secondary | ICD-10-CM | POA: Diagnosis not present

## 2013-12-13 DIAGNOSIS — R269 Unspecified abnormalities of gait and mobility: Secondary | ICD-10-CM | POA: Diagnosis not present

## 2013-12-13 DIAGNOSIS — E039 Hypothyroidism, unspecified: Secondary | ICD-10-CM | POA: Diagnosis not present

## 2013-12-13 DIAGNOSIS — E538 Deficiency of other specified B group vitamins: Secondary | ICD-10-CM | POA: Diagnosis not present

## 2013-12-13 DIAGNOSIS — M6281 Muscle weakness (generalized): Secondary | ICD-10-CM | POA: Diagnosis not present

## 2013-12-13 DIAGNOSIS — E119 Type 2 diabetes mellitus without complications: Secondary | ICD-10-CM | POA: Diagnosis not present

## 2013-12-13 DIAGNOSIS — E559 Vitamin D deficiency, unspecified: Secondary | ICD-10-CM | POA: Diagnosis not present

## 2013-12-14 DIAGNOSIS — R269 Unspecified abnormalities of gait and mobility: Secondary | ICD-10-CM | POA: Diagnosis not present

## 2013-12-14 DIAGNOSIS — S82102S Unspecified fracture of upper end of left tibia, sequela: Secondary | ICD-10-CM | POA: Diagnosis not present

## 2013-12-14 DIAGNOSIS — M6281 Muscle weakness (generalized): Secondary | ICD-10-CM | POA: Diagnosis not present

## 2013-12-16 DIAGNOSIS — S82102S Unspecified fracture of upper end of left tibia, sequela: Secondary | ICD-10-CM | POA: Diagnosis not present

## 2013-12-16 DIAGNOSIS — M6281 Muscle weakness (generalized): Secondary | ICD-10-CM | POA: Diagnosis not present

## 2013-12-16 DIAGNOSIS — R269 Unspecified abnormalities of gait and mobility: Secondary | ICD-10-CM | POA: Diagnosis not present

## 2013-12-17 DIAGNOSIS — R269 Unspecified abnormalities of gait and mobility: Secondary | ICD-10-CM | POA: Diagnosis not present

## 2013-12-17 DIAGNOSIS — M6281 Muscle weakness (generalized): Secondary | ICD-10-CM | POA: Diagnosis not present

## 2013-12-17 DIAGNOSIS — S82102S Unspecified fracture of upper end of left tibia, sequela: Secondary | ICD-10-CM | POA: Diagnosis not present

## 2013-12-18 DIAGNOSIS — R269 Unspecified abnormalities of gait and mobility: Secondary | ICD-10-CM | POA: Diagnosis not present

## 2013-12-18 DIAGNOSIS — M6281 Muscle weakness (generalized): Secondary | ICD-10-CM | POA: Diagnosis not present

## 2013-12-18 DIAGNOSIS — S82102S Unspecified fracture of upper end of left tibia, sequela: Secondary | ICD-10-CM | POA: Diagnosis not present

## 2013-12-19 ENCOUNTER — Encounter (HOSPITAL_BASED_OUTPATIENT_CLINIC_OR_DEPARTMENT_OTHER): Payer: Medicare Other | Attending: Plastic Surgery

## 2013-12-19 DIAGNOSIS — L97929 Non-pressure chronic ulcer of unspecified part of left lower leg with unspecified severity: Secondary | ICD-10-CM | POA: Insufficient documentation

## 2013-12-19 DIAGNOSIS — M6281 Muscle weakness (generalized): Secondary | ICD-10-CM | POA: Diagnosis not present

## 2013-12-19 DIAGNOSIS — S82102S Unspecified fracture of upper end of left tibia, sequela: Secondary | ICD-10-CM | POA: Diagnosis not present

## 2013-12-19 DIAGNOSIS — R269 Unspecified abnormalities of gait and mobility: Secondary | ICD-10-CM | POA: Diagnosis not present

## 2013-12-19 NOTE — Progress Notes (Signed)
[facility]  NAME:  [name], YANNI NO.:  [id number]  MEDICAL RECORD NO.:  [id number]      DATE OF BIRTH:  January 23, 1925  PHYSICIAN:  Theodoro Kos, DO       VISIT DATE:  12/19/2013                                  OFFICE VISIT   The patient is an 80 year old female who is here for a followup on a new wound of her left lower extremity.  She underwent surgical debridement for a fracture with ACell placement a couple of times and healed.  She noticed in the last week breakdown in the skin with a Q-Tip size hole with little bit of clear fluid draining from it.  She has seen her orthopedic surgeon and is scheduled to see him again this week.  A CT scan was done which I reviewed, it does not look like osteomyelitis at this time and it does not look like the open area is over the screw, but she does have a very large rod in place.  We will treat this conservatively at the moment with triple antibiotics ointment, elevation, multivitamin, vitamin C, zinc, protein, and light wrap.  This may be a problem brewing from an orthopedic standpoint and will need to be addressed by Orthopedics.  There is nothing that I can do at the moment but treat it conservatively.     Theodoro Kos, DO     CS/MEDQ  D:  12/19/2013  T:  12/19/2013  Job:  [id number]

## 2013-12-20 DIAGNOSIS — M6281 Muscle weakness (generalized): Secondary | ICD-10-CM | POA: Diagnosis not present

## 2013-12-20 DIAGNOSIS — R269 Unspecified abnormalities of gait and mobility: Secondary | ICD-10-CM | POA: Diagnosis not present

## 2013-12-21 DIAGNOSIS — R269 Unspecified abnormalities of gait and mobility: Secondary | ICD-10-CM | POA: Diagnosis not present

## 2013-12-21 DIAGNOSIS — M6281 Muscle weakness (generalized): Secondary | ICD-10-CM | POA: Diagnosis not present

## 2013-12-21 DIAGNOSIS — S82202E Unspecified fracture of shaft of left tibia, subsequent encounter for open fracture type I or II with routine healing: Secondary | ICD-10-CM | POA: Diagnosis not present

## 2013-12-22 DIAGNOSIS — M6281 Muscle weakness (generalized): Secondary | ICD-10-CM | POA: Diagnosis not present

## 2013-12-22 DIAGNOSIS — R269 Unspecified abnormalities of gait and mobility: Secondary | ICD-10-CM | POA: Diagnosis not present

## 2013-12-23 DIAGNOSIS — M6281 Muscle weakness (generalized): Secondary | ICD-10-CM | POA: Diagnosis not present

## 2013-12-23 DIAGNOSIS — R269 Unspecified abnormalities of gait and mobility: Secondary | ICD-10-CM | POA: Diagnosis not present

## 2013-12-25 DIAGNOSIS — R269 Unspecified abnormalities of gait and mobility: Secondary | ICD-10-CM | POA: Diagnosis not present

## 2013-12-25 DIAGNOSIS — M6281 Muscle weakness (generalized): Secondary | ICD-10-CM | POA: Diagnosis not present

## 2013-12-26 DIAGNOSIS — R269 Unspecified abnormalities of gait and mobility: Secondary | ICD-10-CM | POA: Diagnosis not present

## 2013-12-26 DIAGNOSIS — M6281 Muscle weakness (generalized): Secondary | ICD-10-CM | POA: Diagnosis not present

## 2013-12-27 DIAGNOSIS — M6281 Muscle weakness (generalized): Secondary | ICD-10-CM | POA: Diagnosis not present

## 2013-12-27 DIAGNOSIS — R269 Unspecified abnormalities of gait and mobility: Secondary | ICD-10-CM | POA: Diagnosis not present

## 2013-12-28 DIAGNOSIS — R269 Unspecified abnormalities of gait and mobility: Secondary | ICD-10-CM | POA: Diagnosis not present

## 2013-12-28 DIAGNOSIS — M6281 Muscle weakness (generalized): Secondary | ICD-10-CM | POA: Diagnosis not present

## 2013-12-29 DIAGNOSIS — R269 Unspecified abnormalities of gait and mobility: Secondary | ICD-10-CM | POA: Diagnosis not present

## 2013-12-29 DIAGNOSIS — M6281 Muscle weakness (generalized): Secondary | ICD-10-CM | POA: Diagnosis not present

## 2014-01-02 ENCOUNTER — Encounter (HOSPITAL_BASED_OUTPATIENT_CLINIC_OR_DEPARTMENT_OTHER): Payer: Medicare Other | Attending: Plastic Surgery

## 2014-01-02 DIAGNOSIS — I872 Venous insufficiency (chronic) (peripheral): Secondary | ICD-10-CM | POA: Diagnosis not present

## 2014-01-02 DIAGNOSIS — L97929 Non-pressure chronic ulcer of unspecified part of left lower leg with unspecified severity: Secondary | ICD-10-CM | POA: Diagnosis not present

## 2014-01-03 ENCOUNTER — Ambulatory Visit (INDEPENDENT_AMBULATORY_CARE_PROVIDER_SITE_OTHER): Payer: Medicare Other | Admitting: Pharmacist

## 2014-01-03 ENCOUNTER — Other Ambulatory Visit: Payer: Self-pay | Admitting: *Deleted

## 2014-01-03 DIAGNOSIS — I4892 Unspecified atrial flutter: Secondary | ICD-10-CM

## 2014-01-03 DIAGNOSIS — Z5181 Encounter for therapeutic drug level monitoring: Secondary | ICD-10-CM

## 2014-01-03 LAB — POCT INR: INR: 1.8

## 2014-01-03 MED ORDER — WARFARIN SODIUM 5 MG PO TABS: ORAL_TABLET | ORAL | Status: DC

## 2014-01-03 NOTE — Progress Notes (Signed)
[facility]  NAME:  [name], [name] NO.:  [id number]  MEDICAL RECORD NO.:  [id number]      DATE OF BIRTH:  [birth year]  PHYSICIAN:  Theodoro Kos, DO       VISIT DATE:  01/02/2014                                  OFFICE VISIT   The patient is an 80 year old female who is here for followup on her left lower extremity leg ulcer.  She actually healed and then broke down again.  She has significant hardware in place, and she has been seen by the Orthopedic surgeon recently.  There has been no change in her medications or social history, other than that she is at home now.  PHYSICAL EXAMINATION:  She is alert, oriented, cooperative, very pleasant.  Her pupils are equal.  Her breathing is unlabored.  Her heart rate is regular.  There is a Q-tip size wound on the medial aspect of the lower left leg.  It is difficult to see exactly where this lines up with the hardware, and I placed a call to Dr. Marlou Sa to discuss this with him.  In the meantime, she will need to pack this and elevate, increase her protein, multivitamin, vitamin C, zinc, and compression.  We will see her back in 1 week.     Theodoro Kos, DO     CS/MEDQ  D:  01/02/2014  T:  01/03/2014  Job:  [id number]

## 2014-01-04 ENCOUNTER — Telehealth: Payer: Self-pay | Admitting: Internal Medicine

## 2014-01-04 DIAGNOSIS — M545 Low back pain: Secondary | ICD-10-CM

## 2014-01-04 DIAGNOSIS — M1990 Unspecified osteoarthritis, unspecified site: Secondary | ICD-10-CM

## 2014-01-04 NOTE — Telephone Encounter (Signed)
[name] called from [facility] requesting order for shower chair. Pt was recommended by the PT from [facility] to get one before they get out from the nursing home. Please call her with ok if this is ok.

## 2014-01-04 NOTE — Telephone Encounter (Signed)
OK. Thx

## 2014-01-05 NOTE — Telephone Encounter (Signed)
Called [facility] no answer LMOM md ok shower chair faxing to advance...[name]

## 2014-01-06 DIAGNOSIS — Z1231 Encounter for screening mammogram for malignant neoplasm of breast: Secondary | ICD-10-CM | POA: Diagnosis not present

## 2014-01-09 DIAGNOSIS — I872 Venous insufficiency (chronic) (peripheral): Secondary | ICD-10-CM | POA: Diagnosis not present

## 2014-01-09 DIAGNOSIS — L97929 Non-pressure chronic ulcer of unspecified part of left lower leg with unspecified severity: Secondary | ICD-10-CM | POA: Diagnosis not present

## 2014-01-17 ENCOUNTER — Ambulatory Visit (INDEPENDENT_AMBULATORY_CARE_PROVIDER_SITE_OTHER): Payer: Medicare Other | Admitting: *Deleted

## 2014-01-17 ENCOUNTER — Ambulatory Visit (INDEPENDENT_AMBULATORY_CARE_PROVIDER_SITE_OTHER): Payer: Medicare Other | Admitting: Internal Medicine

## 2014-01-17 ENCOUNTER — Other Ambulatory Visit (INDEPENDENT_AMBULATORY_CARE_PROVIDER_SITE_OTHER): Payer: Medicare Other

## 2014-01-17 ENCOUNTER — Encounter: Payer: Self-pay | Admitting: Internal Medicine

## 2014-01-17 VITALS — BP 150/90 | HR 92 | Temp 98.4°F | Ht 63.0 in | Wt 192.0 lb

## 2014-01-17 DIAGNOSIS — I4892 Unspecified atrial flutter: Secondary | ICD-10-CM | POA: Diagnosis not present

## 2014-01-17 DIAGNOSIS — D509 Iron deficiency anemia, unspecified: Secondary | ICD-10-CM | POA: Diagnosis not present

## 2014-01-17 DIAGNOSIS — M5441 Lumbago with sciatica, right side: Secondary | ICD-10-CM

## 2014-01-17 DIAGNOSIS — E559 Vitamin D deficiency, unspecified: Secondary | ICD-10-CM

## 2014-01-17 DIAGNOSIS — R202 Paresthesia of skin: Secondary | ICD-10-CM

## 2014-01-17 DIAGNOSIS — I10 Essential (primary) hypertension: Secondary | ICD-10-CM | POA: Diagnosis not present

## 2014-01-17 DIAGNOSIS — Z5181 Encounter for therapeutic drug level monitoring: Secondary | ICD-10-CM

## 2014-01-17 DIAGNOSIS — S82202S Unspecified fracture of shaft of left tibia, sequela: Secondary | ICD-10-CM

## 2014-01-17 DIAGNOSIS — E038 Other specified hypothyroidism: Secondary | ICD-10-CM | POA: Diagnosis not present

## 2014-01-17 DIAGNOSIS — E034 Atrophy of thyroid (acquired): Secondary | ICD-10-CM

## 2014-01-17 DIAGNOSIS — R609 Edema, unspecified: Secondary | ICD-10-CM | POA: Insufficient documentation

## 2014-01-17 DIAGNOSIS — I872 Venous insufficiency (chronic) (peripheral): Secondary | ICD-10-CM | POA: Diagnosis not present

## 2014-01-17 DIAGNOSIS — M5442 Lumbago with sciatica, left side: Secondary | ICD-10-CM

## 2014-01-17 LAB — TSH: TSH: 0.51 u[IU]/mL (ref 0.35–4.50)

## 2014-01-17 LAB — CBC WITH DIFFERENTIAL/PLATELET
Basophils Absolute: 0.1 10*3/uL (ref 0.0–0.1)
Basophils Relative: 0.9 % (ref 0.0–3.0)
Eosinophils Absolute: 0.2 10*3/uL (ref 0.0–0.7)
Eosinophils Relative: 2.5 % (ref 0.0–5.0)
HCT: 38 % (ref 36.0–46.0)
Hemoglobin: 12.3 g/dL (ref 12.0–15.0)
Lymphocytes Relative: 31.2 % (ref 12.0–46.0)
Lymphs Abs: 2 10*3/uL (ref 0.7–4.0)
MCHC: 32.4 g/dL (ref 30.0–36.0)
MCV: 99.8 fl (ref 78.0–100.0)
Monocytes Absolute: 0.6 10*3/uL (ref 0.1–1.0)
Monocytes Relative: 9.7 % (ref 3.0–12.0)
Neutro Abs: 3.6 10*3/uL (ref 1.4–7.7)
Neutrophils Relative %: 55.7 % (ref 43.0–77.0)
Platelets: 263 10*3/uL (ref 150.0–400.0)
RBC: 3.8 Mil/uL — ABNORMAL LOW (ref 3.87–5.11)
RDW: 14.2 % (ref 11.5–15.5)
WBC: 6.4 10*3/uL (ref 4.0–10.5)

## 2014-01-17 LAB — HEPATIC FUNCTION PANEL
ALT: 19 U/L (ref 0–35)
AST: 24 U/L (ref 0–37)
Albumin: 4 g/dL (ref 3.5–5.2)
Alkaline Phosphatase: 86 U/L (ref 39–117)
Bilirubin, Direct: 0.2 mg/dL (ref 0.0–0.3)
Total Bilirubin: 0.8 mg/dL (ref 0.2–1.2)
Total Protein: 8.3 g/dL (ref 6.0–8.3)

## 2014-01-17 LAB — BASIC METABOLIC PANEL
BUN: 25 mg/dL — ABNORMAL HIGH (ref 6–23)
CO2: 29 mEq/L (ref 19–32)
Calcium: 8.9 mg/dL (ref 8.4–10.5)
Chloride: 99 mEq/L (ref 96–112)
Creatinine, Ser: 1.2 mg/dL (ref 0.4–1.2)
GFR: 47.06 mL/min — ABNORMAL LOW (ref >60.00)
Glucose, Bld: 137 mg/dL — ABNORMAL HIGH (ref 70–99)
Potassium: 3.3 mEq/L — ABNORMAL LOW (ref 3.5–5.1)
Sodium: 137 mEq/L (ref 135–145)

## 2014-01-17 LAB — VITAMIN D 25 HYDROXY (VIT D DEFICIENCY, FRACTURES): VITD: 42.24 ng/mL (ref 30.00–100.00)

## 2014-01-17 LAB — IBC PANEL
Iron: 54 ug/dL (ref 42–145)
Saturation Ratios: 17.2 % — ABNORMAL LOW (ref 20.0–50.0)
Transferrin: 224.2 mg/dL (ref 212.0–360.0)

## 2014-01-17 LAB — POCT INR: INR: 3.2

## 2014-01-17 LAB — VITAMIN B12: Vitamin B-12: 585 pg/mL (ref 211–911)

## 2014-01-17 MED ORDER — FUROSEMIDE 40 MG PO TABS: ORAL_TABLET | ORAL | Status: DC

## 2014-01-17 NOTE — Assessment & Plan Note (Signed)
Continue with current prescription therapy as reflected on the Med list.  

## 2014-01-17 NOTE — Progress Notes (Signed)
   Subjective:     HPI  F/u LBP, LLE open fx - slow healing  The patient is here to follow up on chronic LBP - a little better, situational depression due to LBP - better, anxiety, HTN controlled with medicines. LBP is bad w/activity  BP Readings from Last 3 Encounters:  01/17/14 150/90  09/08/13 114/62  08/22/13 134/79   Wt Readings from Last 3 Encounters:  01/17/14 192 lb (87.091 kg)  09/01/13 183 lb 6.8 oz (83.2 kg)  08/22/13 181 lb (82.101 kg)      Review of Systems  Constitutional: Positive for fatigue. Negative for chills, activity change, appetite change and unexpected weight change.  HENT: Negative for congestion, mouth sores and sinus pressure.   Eyes: Negative for visual disturbance.  Respiratory: Negative for cough and chest tightness.   Gastrointestinal: Negative for nausea and abdominal pain.  Genitourinary: Negative for frequency, difficulty urinating and vaginal pain.  Musculoskeletal: Positive for back pain. Negative for gait problem.  Skin: Negative for pallor and rash.  Neurological: Negative for dizziness, tremors, weakness, numbness and headaches.  Psychiatric/Behavioral: Negative for suicidal ideas, confusion and sleep disturbance. The patient is not nervous/anxious.        Objective:   Physical Exam  Constitutional: She appears well-developed. No distress.  HENT:  Head: Normocephalic.  Right Ear: External ear normal.  Left Ear: External ear normal.  Nose: Nose normal.  Mouth/Throat: Oropharynx is clear and moist.  Eyes: Conjunctivae are normal. Pupils are equal, round, and reactive to light. Right eye exhibits no discharge. Left eye exhibits no discharge.  Neck: Normal range of motion. Neck supple. No JVD present. No tracheal deviation present. No thyromegaly present.  Cardiovascular: Normal rate, regular rhythm and normal heart sounds.   Pulmonary/Chest: No stridor. No respiratory distress. She has no wheezes.  Abdominal: Soft. Bowel sounds  are normal. She exhibits no distension and no mass. There is no tenderness. There is no rebound and no guarding.  Musculoskeletal: She exhibits no edema or tenderness.  Lymphadenopathy:    She has no cervical adenopathy.  Neurological: She displays normal reflexes. No cranial nerve deficit. She exhibits normal muscle tone. Coordination abnormal.  Walker   Skin: No rash noted. No erythema.  Psychiatric: She has a normal mood and affect. Her behavior is normal. Judgment and thought content normal.  LLE wound is dressed    Lab Results  Component Value Date   WBC 8.9 09/08/2013   HGB 11.4* 09/08/2013   HCT 34.1* 09/08/2013   PLT 455* 09/08/2013   GLUCOSE 77 09/08/2013   CHOL 116 06/25/2012   TRIG 82 09/05/2013   HDL 41.50 06/25/2012   LDLDIRECT 110.3 05/24/2008   LDLCALC 66 06/25/2012   ALT 36* 09/08/2013   AST 27 09/08/2013   NA 137 09/08/2013   K 4.0 09/08/2013   CL 102 09/08/2013   CREATININE 0.79 09/08/2013   BUN 15 09/08/2013   CO2 24 09/08/2013   TSH 0.650 06/22/2013   INR 1.8 01/03/2014   HGBA1C 6.1* 09/08/2013       Assessment & Plan:

## 2014-01-17 NOTE — Assessment & Plan Note (Signed)
Loose wt Low carb diet 

## 2014-01-17 NOTE — Assessment & Plan Note (Signed)
06/16/13 L

## 2014-01-17 NOTE — Assessment & Plan Note (Signed)
Continue with current prescription therapy as reflected on the Med list. Better 

## 2014-01-17 NOTE — Assessment & Plan Note (Signed)
Will reduce diuretics

## 2014-01-18 ENCOUNTER — Other Ambulatory Visit: Payer: Self-pay | Admitting: Internal Medicine

## 2014-01-18 MED ORDER — POTASSIUM CHLORIDE ER 10 MEQ PO TBCR: 10.0000 meq | EXTENDED_RELEASE_TABLET | Freq: Every day | ORAL | Status: DC

## 2014-01-23 ENCOUNTER — Encounter (HOSPITAL_BASED_OUTPATIENT_CLINIC_OR_DEPARTMENT_OTHER): Payer: Medicare Other | Attending: Plastic Surgery

## 2014-01-23 DIAGNOSIS — L97829 Non-pressure chronic ulcer of other part of left lower leg with unspecified severity: Secondary | ICD-10-CM | POA: Diagnosis not present

## 2014-01-23 DIAGNOSIS — I872 Venous insufficiency (chronic) (peripheral): Secondary | ICD-10-CM | POA: Diagnosis not present

## 2014-01-24 NOTE — Progress Notes (Signed)
[facility]  NAME:  [name], [name] NO.:  [id number]  MEDICAL RECORD NO.:  [id number]      DATE OF BIRTH:  [birth year]  PHYSICIAN:  Theodoro Kos, DO       VISIT DATE:  01/23/2014                                  OFFICE VISIT   The patient is an 81 year old female who is here for followup on her left lower extremity chronic ulcer, venous insufficiency.  There is not much changed.  There is no sign of infection.  There is no redness, no swelling and there does not appear to be any drainage.  There is no change in her medical history.  Her review of systems is negative.  On exam, she is alert and oriented, cooperative, not in any distress. She is a little down about having the wound, but no recent illnesses. Pulse present.  She has some hemosiderosis with varicose veins noted. Her breathing is unlabored and her heart rate is regular.  Endoform was placed and we will use a Profore Lite for some light compression, elevation, multivitamin, continue with the protein and we will see her back in 1 week.     Theodoro Kos, DO     CS/MEDQ  D:  01/23/2014  T:  01/24/2014  Job:  [id number]

## 2014-01-25 ENCOUNTER — Encounter: Payer: Self-pay | Admitting: Internal Medicine

## 2014-01-26 ENCOUNTER — Encounter: Payer: Self-pay | Admitting: Internal Medicine

## 2014-01-27 ENCOUNTER — Other Ambulatory Visit: Payer: Self-pay | Admitting: Internal Medicine

## 2014-01-30 DIAGNOSIS — L97829 Non-pressure chronic ulcer of other part of left lower leg with unspecified severity: Secondary | ICD-10-CM | POA: Diagnosis not present

## 2014-01-30 DIAGNOSIS — I872 Venous insufficiency (chronic) (peripheral): Secondary | ICD-10-CM | POA: Diagnosis not present

## 2014-01-31 ENCOUNTER — Telehealth: Payer: Self-pay | Admitting: Internal Medicine

## 2014-01-31 ENCOUNTER — Ambulatory Visit (INDEPENDENT_AMBULATORY_CARE_PROVIDER_SITE_OTHER): Payer: Medicare Other | Admitting: Pharmacist Clinician (PhC)/ Clinical Pharmacy Specialist

## 2014-01-31 DIAGNOSIS — Z5181 Encounter for therapeutic drug level monitoring: Secondary | ICD-10-CM | POA: Diagnosis not present

## 2014-01-31 DIAGNOSIS — I4892 Unspecified atrial flutter: Secondary | ICD-10-CM | POA: Diagnosis not present

## 2014-01-31 LAB — POCT INR: INR: 2

## 2014-01-31 MED ORDER — TEMAZEPAM 30 MG PO CAPS: 30.0000 mg | ORAL_CAPSULE | Freq: Every day | ORAL | Status: DC

## 2014-01-31 MED ORDER — BUSPIRONE HCL 7.5 MG PO TABS: 7.5000 mg | ORAL_TABLET | Freq: Two times a day (BID) | ORAL | Status: DC

## 2014-01-31 MED ORDER — ATORVASTATIN CALCIUM 10 MG PO TABS: 10.0000 mg | ORAL_TABLET | Freq: Every evening | ORAL | Status: DC

## 2014-01-31 NOTE — Telephone Encounter (Signed)
Patient states she is at drug store.  She states she needs a refill sent to her pharmacy for temazepam.  States she has requested since Friday and would like a call back.

## 2014-01-31 NOTE — Progress Notes (Signed)
[facility]  NAME:  [name], [name] NO.:  [id number]  MEDICAL RECORD NO.:  [id number]      DATE OF BIRTH:  1925/02/17  PHYSICIAN:  Theodoro Kos, DO       VISIT DATE:  01/30/2014                                  OFFICE VISIT   The patient is an 81 year old here for followup on her left lower extremity ulcer.  She had a severe open fracture that she healed and then this broke down, so we have been treating it with collagen and Endoform.  It does not seem to be changing much.  There is no change in her medications or social history.  On exam, she is alert, oriented, cooperative, not in any distress but discouraged.  She is here with her daughter and her husband.  Her breathing is unlabored.  Her heart rate is regular.  The lower extremity does not look to be infected.  There is whole and it does track down but it is difficult to tell if it is true bone or not.  Have correlated it with the films and it is hard to tell if there is hardware actually exposed.  None was visualized.  We will continue with the Endoform.  I will talk with Dr. Marlou Sa and we will plan on going to the OR for ACell placement.     Theodoro Kos, DO     CS/MEDQ  D:  01/30/2014  T:  01/31/2014  Job:  [id number]

## 2014-01-31 NOTE — Telephone Encounter (Signed)
Pt called in and needs refills on all three of these meds as soon has possible.  She is almost completely out.    Buspirone 7.5mg  Atovastian 10mg  tomazapam

## 2014-01-31 NOTE — Telephone Encounter (Signed)
Sent maintenance meds pls advise on temazepam.../lmb

## 2014-01-31 NOTE — Telephone Encounter (Signed)
Ok to Rf per MD. Rf phoned in. See meds. Left detailed mess informing pt.

## 2014-02-01 ENCOUNTER — Encounter: Payer: Self-pay | Admitting: Internal Medicine

## 2014-02-01 ENCOUNTER — Encounter (HOSPITAL_BASED_OUTPATIENT_CLINIC_OR_DEPARTMENT_OTHER): Payer: Self-pay | Admitting: *Deleted

## 2014-02-01 MED ORDER — TEMAZEPAM 30 MG PO CAPS: 30.0000 mg | ORAL_CAPSULE | Freq: Every day | ORAL | Status: DC

## 2014-02-01 NOTE — Telephone Encounter (Signed)
MD ok temazepam reprinted rx & fax to pharmacy...[name]

## 2014-02-01 NOTE — Telephone Encounter (Signed)
Rx has been fax to [facility].../lmb

## 2014-02-02 ENCOUNTER — Encounter (HOSPITAL_BASED_OUTPATIENT_CLINIC_OR_DEPARTMENT_OTHER): Payer: Self-pay | Admitting: *Deleted

## 2014-02-02 NOTE — Progress Notes (Signed)
NPO AFTER MN WITH EXCEPTION CLEAR LIQUIDS UNTIL 0830 (NO CREAM/ MILK PRODUCTS). ARRIVE AT 1315. CURRENT LAB RESULTS AND EKG IN CHART AND EPIC. WILL TAKE AM MEDS WITH EXCEPTION NO LASIX DOS W/ SIPS OF WATER.

## 2014-02-03 ENCOUNTER — Other Ambulatory Visit: Payer: Self-pay | Admitting: Plastic Surgery

## 2014-02-03 DIAGNOSIS — L97924 Non-pressure chronic ulcer of unspecified part of left lower leg with necrosis of bone: Secondary | ICD-10-CM

## 2014-02-06 ENCOUNTER — Encounter (HOSPITAL_BASED_OUTPATIENT_CLINIC_OR_DEPARTMENT_OTHER)
Admission: RE | Disposition: A | Discharge status: Home / Self Care | Payer: Self-pay | Source: Ambulatory Visit | Attending: Plastic Surgery

## 2014-02-06 ENCOUNTER — Ambulatory Visit (HOSPITAL_BASED_OUTPATIENT_CLINIC_OR_DEPARTMENT_OTHER): Payer: Medicare Other | Admitting: Anesthesiology

## 2014-02-06 ENCOUNTER — Ambulatory Visit (HOSPITAL_BASED_OUTPATIENT_CLINIC_OR_DEPARTMENT_OTHER)
Admission: RE | Admit: 2014-02-06 | Discharge: 2014-02-06 | Disposition: A | Discharge status: Home / Self Care | Payer: Medicare Other | Source: Ambulatory Visit | Attending: Plastic Surgery | Admitting: Plastic Surgery

## 2014-02-06 ENCOUNTER — Encounter (HOSPITAL_BASED_OUTPATIENT_CLINIC_OR_DEPARTMENT_OTHER): Payer: Self-pay | Admitting: *Deleted

## 2014-02-06 DIAGNOSIS — X58XXXA Exposure to other specified factors, initial encounter: Secondary | ICD-10-CM | POA: Diagnosis not present

## 2014-02-06 DIAGNOSIS — L97929 Non-pressure chronic ulcer of unspecified part of left lower leg with unspecified severity: Secondary | ICD-10-CM | POA: Diagnosis not present

## 2014-02-06 DIAGNOSIS — S81802A Unspecified open wound, left lower leg, initial encounter: Secondary | ICD-10-CM | POA: Insufficient documentation

## 2014-02-06 DIAGNOSIS — T8189XS Other complications of procedures, not elsewhere classified, sequela: Secondary | ICD-10-CM | POA: Diagnosis not present

## 2014-02-06 DIAGNOSIS — I739 Peripheral vascular disease, unspecified: Secondary | ICD-10-CM | POA: Insufficient documentation

## 2014-02-06 DIAGNOSIS — S81802S Unspecified open wound, left lower leg, sequela: Secondary | ICD-10-CM | POA: Diagnosis not present

## 2014-02-06 DIAGNOSIS — I10 Essential (primary) hypertension: Secondary | ICD-10-CM | POA: Insufficient documentation

## 2014-02-06 DIAGNOSIS — F17210 Nicotine dependence, cigarettes, uncomplicated: Secondary | ICD-10-CM | POA: Diagnosis not present

## 2014-02-06 DIAGNOSIS — Y939 Activity, unspecified: Secondary | ICD-10-CM | POA: Diagnosis not present

## 2014-02-06 DIAGNOSIS — L97924 Non-pressure chronic ulcer of unspecified part of left lower leg with necrosis of bone: Secondary | ICD-10-CM

## 2014-02-06 HISTORY — PX: APPLICATION OF A-CELL OF EXTREMITY: SHX6303

## 2014-02-06 HISTORY — DX: Personal history of diseases of the skin and subcutaneous tissue: Z87.2

## 2014-02-06 HISTORY — DX: Personal history of other diseases of the digestive system: Z87.19

## 2014-02-06 HISTORY — PX: I & D EXTREMITY: SHX5045

## 2014-02-06 SURGERY — IRRIGATION AND DEBRIDEMENT EXTREMITY
Anesthesia: General | Site: Leg Lower | Laterality: Left

## 2014-02-06 MED ORDER — LACTATED RINGERS IV SOLN
INTRAVENOUS | Status: DC
  Administered 2014-02-06: 13:00:00 via INTRAVENOUS
  Filled 2014-02-06: qty 1000

## 2014-02-06 MED ORDER — SODIUM CHLORIDE 0.9 % IR SOLN
Status: DC | PRN
  Administered 2014-02-06: 500 mL

## 2014-02-06 MED ORDER — CIPROFLOXACIN IN D5W 400 MG/200ML IV SOLN
400.0000 mg | INTRAVENOUS | Status: AC
  Administered 2014-02-06: 400 mg via INTRAVENOUS
  Filled 2014-02-06: qty 200

## 2014-02-06 MED ORDER — FENTANYL CITRATE 0.05 MG/ML IJ SOLN
INTRAMUSCULAR | Status: AC
  Filled 2014-02-06: qty 2

## 2014-02-06 MED ORDER — DEXAMETHASONE SODIUM PHOSPHATE 4 MG/ML IJ SOLN
INTRAMUSCULAR | Status: DC | PRN
  Administered 2014-02-06: 10 mg via INTRAVENOUS

## 2014-02-06 MED ORDER — ONDANSETRON HCL 4 MG/2ML IJ SOLN
INTRAMUSCULAR | Status: DC | PRN
  Administered 2014-02-06: 4 mg via INTRAVENOUS

## 2014-02-06 MED ORDER — CIPROFLOXACIN IN D5W 400 MG/200ML IV SOLN
INTRAVENOUS | Status: AC
Start: 2014-02-06 — End: 2014-02-06
  Filled 2014-02-06: qty 200

## 2014-02-06 MED ORDER — FENTANYL CITRATE 0.05 MG/ML IJ SOLN
INTRAMUSCULAR | Status: DC | PRN
  Administered 2014-02-06: 50 ug via INTRAVENOUS

## 2014-02-06 MED ORDER — PROPOFOL 10 MG/ML IV BOLUS
INTRAVENOUS | Status: DC | PRN
  Administered 2014-02-06: 120 mg via INTRAVENOUS

## 2014-02-06 SURGICAL SUPPLY — 95 items
APL SKNCLS STERI-STRIP NONHPOA (GAUZE/BANDAGES/DRESSINGS)
BAG DECANTER FOR FLEXI CONT (MISCELLANEOUS) IMPLANT
BANDAGE ELASTIC 3 VELCRO ST LF (GAUZE/BANDAGES/DRESSINGS) IMPLANT
BANDAGE ELASTIC 4 VELCRO ST LF (GAUZE/BANDAGES/DRESSINGS) ×2 IMPLANT
BANDAGE ELASTIC 6 VELCRO ST LF (GAUZE/BANDAGES/DRESSINGS) IMPLANT
BENZOIN TINCTURE PRP APPL 2/3 (GAUZE/BANDAGES/DRESSINGS) IMPLANT
BLADE MINI RND TIP GREEN BEAV (BLADE) IMPLANT
BLADE SURG 10 STRL SS (BLADE) ×3 IMPLANT
BLADE SURG 15 STRL LF DISP TIS (BLADE) ×1 IMPLANT
BLADE SURG 15 STRL SS (BLADE) ×3
BNDG CMPR 9X4 STRL LF SNTH (GAUZE/BANDAGES/DRESSINGS)
BNDG COHESIVE 1X5 TAN STRL LF (GAUZE/BANDAGES/DRESSINGS) IMPLANT
BNDG COHESIVE 4X5 TAN NS LF (GAUZE/BANDAGES/DRESSINGS) IMPLANT
BNDG ESMARK 4X9 LF (GAUZE/BANDAGES/DRESSINGS) IMPLANT
BNDG GAUZE ELAST 4 BULKY (GAUZE/BANDAGES/DRESSINGS) ×3 IMPLANT
CANISTER SUCT LVC 12 LTR MEDI- (MISCELLANEOUS) IMPLANT
CANISTER SUCTION 1200CC (MISCELLANEOUS) IMPLANT
CANISTER SUCTION 2500CC (MISCELLANEOUS) IMPLANT
CHLORAPREP W/TINT 26ML (MISCELLANEOUS) IMPLANT
CLOSURE WOUND 1/2 X4 (GAUZE/BANDAGES/DRESSINGS)
CLOTH BEACON ORANGE TIMEOUT ST (SAFETY) ×3 IMPLANT
CORDS BIPOLAR (ELECTRODE) IMPLANT
COVER MAYO STAND STRL (DRAPES) ×3 IMPLANT
COVER TABLE BACK 60X90 (DRAPES) ×3 IMPLANT
DECANTER SPIKE VIAL GLASS SM (MISCELLANEOUS) IMPLANT
DRAIN PENROSE 18X1/2 LTX STRL (DRAIN) ×1 IMPLANT
DRAPE EXTREMITY T 121X128X90 (DRAPE) IMPLANT
DRAPE EXTREMITY TIBURON (DRAPES) ×2 IMPLANT
DRAPE INCISE IOBAN 66X45 STRL (DRAPES) IMPLANT
DRAPE LG THREE QUARTER DISP (DRAPES) ×2 IMPLANT
DRSG EMULSION OIL 3X3 NADH (GAUZE/BANDAGES/DRESSINGS) ×2 IMPLANT
DRSG PAD ABDOMINAL 8X10 ST (GAUZE/BANDAGES/DRESSINGS) ×2 IMPLANT
ELECT NDL BLADE 2-5/6 (NEEDLE) IMPLANT
ELECT NDL TIP 2.8 STRL (NEEDLE) IMPLANT
ELECT NEEDLE BLADE 2-5/6 (NEEDLE) IMPLANT
ELECT NEEDLE TIP 2.8 STRL (NEEDLE) IMPLANT
ELECT REM PT RETURN 9FT ADLT (ELECTROSURGICAL) ×3
ELECTRODE REM PT RTRN 9FT ADLT (ELECTROSURGICAL) ×1 IMPLANT
GAUZE SPONGE 4X4 12PLY STRL (GAUZE/BANDAGES/DRESSINGS) ×3 IMPLANT
GAUZE XEROFORM 1X8 LF (GAUZE/BANDAGES/DRESSINGS) IMPLANT
GAUZE XEROFORM 5X9 LF (GAUZE/BANDAGES/DRESSINGS) IMPLANT
GLOVE BIO SURGEON STRL SZ 6.5 (GLOVE) ×4 IMPLANT
GLOVE BIO SURGEONS STRL SZ 6.5 (GLOVE) ×2
GOWN PREVENTION PLUS XLARGE (GOWN DISPOSABLE) ×3 IMPLANT
HANDPIECE INTERPULSE COAX TIP (DISPOSABLE)
IV NS IRRIG 3000ML ARTHROMATIC (IV SOLUTION) IMPLANT
MATRIX SURGICAL PSM 7X10CM (Tissue) ×2 IMPLANT
MICROMATRIX 500MG (Tissue) ×3 IMPLANT
NDL HYPO 30GX1 BEV (NEEDLE) IMPLANT
NEEDLE 27GAX1X1/2 (NEEDLE) IMPLANT
NEEDLE HYPO 30GX1 BEV (NEEDLE) IMPLANT
NS IRRIG 1000ML POUR BTL (IV SOLUTION) ×3 IMPLANT
PACK BASIN DAY SURGERY FS (CUSTOM PROCEDURE TRAY) ×3 IMPLANT
PAD ABD 8X10 STRL (GAUZE/BANDAGES/DRESSINGS) ×2 IMPLANT
PADDING CAST ABS 3INX4YD NS (CAST SUPPLIES)
PADDING CAST ABS 4INX4YD NS (CAST SUPPLIES)
PADDING CAST ABS COTTON 3X4 (CAST SUPPLIES) IMPLANT
PADDING CAST ABS COTTON 4X4 ST (CAST SUPPLIES) IMPLANT
PENCIL BUTTON HOLSTER BLD 10FT (ELECTRODE) ×2 IMPLANT
SET HNDPC FAN SPRY TIP SCT (DISPOSABLE) IMPLANT
SLEEVE SCD COMPRESS KNEE MED (MISCELLANEOUS) IMPLANT
SOLUTION PARTIC MCRMTRX 500MG (Tissue) IMPLANT
SPLINT PLASTER CAST XFAST 3X15 (CAST SUPPLIES) IMPLANT
SPLINT PLASTER XTRA FASTSET 3X (CAST SUPPLIES)
SPONGE GAUZE 4X4 12PLY STER LF (GAUZE/BANDAGES/DRESSINGS) ×2 IMPLANT
SPONGE LAP 18X18 X RAY DECT (DISPOSABLE) IMPLANT
SPONGE LAP 4X18 X RAY DECT (DISPOSABLE) IMPLANT
STAPLER VISISTAT 35W (STAPLE) IMPLANT
STOCKINETTE 4X48 STRL (DRAPES) IMPLANT
STOCKINETTE 6  STRL (DRAPES) ×2
STOCKINETTE 6 STRL (DRAPES) IMPLANT
STOCKINETTE IMPERVIOUS LG (DRAPES) IMPLANT
STRIP CLOSURE SKIN 1/2X4 (GAUZE/BANDAGES/DRESSINGS) IMPLANT
SUCTION FRAZIER TIP 10 FR DISP (SUCTIONS) IMPLANT
SURGILUBE 2OZ TUBE FLIPTOP (MISCELLANEOUS) ×3 IMPLANT
SUT ETHILON 3 0 PS 1 (SUTURE) IMPLANT
SUT ETHILON 4 0 P 3 18 (SUTURE) IMPLANT
SUT ETHILON 5 0 PS 2 18 (SUTURE) ×1 IMPLANT
SUT PROLENE 3 0 PS 2 (SUTURE) IMPLANT
SUT SILK 3 0 PS 1 (SUTURE) ×4 IMPLANT
SUT VIC AB 3-0 FS2 27 (SUTURE) IMPLANT
SUT VIC AB 5-0 PS2 18 (SUTURE) IMPLANT
SYR BULB IRRIGATION 50ML (SYRINGE) ×3 IMPLANT
SYR CONTROL 10ML LL (SYRINGE) ×3 IMPLANT
TAPE HYPAFIX 6 X30' (GAUZE/BANDAGES/DRESSINGS)
TAPE HYPAFIX 6X30 (GAUZE/BANDAGES/DRESSINGS) IMPLANT
TIP RIGID 35CM EVICEL (HEMOSTASIS) IMPLANT
TOWEL OR 17X24 6PK STRL BLUE (TOWEL DISPOSABLE) ×3 IMPLANT
TRAY DSU PREP LF (CUSTOM PROCEDURE TRAY) ×2 IMPLANT
TUBE CONNECTING 12'X1/4 (SUCTIONS) ×1
TUBE CONNECTING 12X1/4 (SUCTIONS) ×2 IMPLANT
UNDERPAD 30X30 INCONTINENT (UNDERPADS AND DIAPERS) ×3 IMPLANT
WATER STERILE IRR 1000ML POUR (IV SOLUTION) ×1 IMPLANT
WATER STERILE IRR 500ML POUR (IV SOLUTION) ×3 IMPLANT
YANKAUER SUCT BULB TIP NO VENT (SUCTIONS) ×3 IMPLANT

## 2014-02-06 NOTE — Discharge Instructions (Addendum)
KY gel to the leg daily.    Call your surgeon if you experience:   1.  Fever over 101.0. 2.  Inability to urinate. 3.  Nausea and/or vomiting. 4.  Extreme swelling or bruising at the surgical site. 5.  Continued bleeding from the incision. 6.  Increased pain, redness or drainage from the incision. 7.  Problems related to your pain medication. 8. Any change in color, movement and/or sensation 9. Any problems and/or concerns     Post Anesthesia Home Care Instructions  Activity: Get plenty of rest for the remainder of the day. A responsible adult should stay with you for 24 hours following the procedure.  For the next 24 hours, DO NOT: -Drive a car -Paediatric nurse -Drink alcoholic beverages -Take any medication unless instructed by your physician -Make any legal decisions or sign important papers.  Meals: Start with liquid foods such as gelatin or soup. Progress to regular foods as tolerated. Avoid greasy, spicy, heavy foods. If nausea and/or vomiting occur, drink only clear liquids until the nausea and/or vomiting subsides. Call your physician if vomiting continues.  Special Instructions/Symptoms: Your throat may feel dry or sore from the anesthesia or the breathing tube placed in your throat during surgery. If this causes discomfort, gargle with warm salt water. The discomfort should disappear within 24 hours.

## 2014-02-06 NOTE — Interval H&P Note (Signed)
History and Physical Interval Note:  02/06/2014 1:22 PM  [name]  has presented today for surgery, with the diagnosis of LEFT LEG ULCER   The various methods of treatment have been discussed with the patient and family. After consideration of risks, benefits and other options for treatment, the patient has consented to  Procedure(s): IRRIGATION AND DEBRIDEMENT LEFT LEG WOUND  (Left) WITH PLACEMENT OF A -CELL  (Left) as a surgical intervention .  The patient's history has been reviewed, patient examined, no change in status, stable for surgery.  I have reviewed the patient's chart and labs.  Questions were answered to the patient's satisfaction.     SANGER,Addyson Traub

## 2014-02-06 NOTE — Anesthesia Preprocedure Evaluation (Addendum)
Anesthesia Evaluation  Patient identified by MRN, date of birth, ID band Patient awake    Reviewed: Allergy & Precautions, H&P , NPO status , Patient's Chart, lab work & pertinent test results, reviewed documented beta blocker date and time   Airway Mallampati: II  TM Distance: >3 FB Neck ROM: Full  Mouth opening: Limited Mouth Opening  Dental  (+) Dental Advisory Given, Caps,  2 front upper are capped:   Pulmonary neg pulmonary ROS, shortness of breath and with exertion, Current Smoker,  breath sounds clear to auscultation  Pulmonary exam normal       Cardiovascular hypertension, Pt. on medications and Pt. on home beta blockers + Peripheral Vascular Disease + dysrhythmias Atrial Fibrillation Rhythm:Regular Rate:Normal  RBBB. Chronic AF   Neuro/Psych Anxiety negative neurological ROS  negative psych ROS   GI/Hepatic Neg liver ROS, hiatal hernia, PUD, GERD-  Medicated and Controlled,  Endo/Other  negative endocrine ROSHypothyroidism   Renal/GU negative Renal ROS  negative genitourinary   Musculoskeletal  (+) Arthritis -, Osteoarthritis,    Abdominal   Peds  Hematology negative hematology ROS (+)   Anesthesia Other Findings   Reproductive/Obstetrics negative OB ROS                           Anesthesia Physical Anesthesia Plan  ASA: III  Anesthesia Plan: General   Post-op Pain Management:    Induction:   Airway Management Planned: LMA  Additional Equipment:   Intra-op Plan:   Post-operative Plan:   Informed Consent:   Plan Discussed with: Surgeon  Anesthesia Plan Comments:         Anesthesia Quick Evaluation

## 2014-02-06 NOTE — H&P (View-Only) (Signed)
[facility]  NAME:  [name], [name] NO.:  [id number]  MEDICAL RECORD NO.:  [id number]      DATE OF BIRTH:  12-Feb-1925  PHYSICIAN:  Theodoro Kos, DO       VISIT DATE:  01/30/2014                                  OFFICE VISIT   The patient is an 81 year old here for followup on her left lower extremity ulcer.  She had a severe open fracture that she healed and then this broke down, so we have been treating it with collagen and Endoform.  It does not seem to be changing much.  There is no change in her medications or social history.  On exam, she is alert, oriented, cooperative, not in any distress but discouraged.  She is here with her daughter and her husband.  Her breathing is unlabored.  Her heart rate is regular.  The lower extremity does not look to be infected.  There is whole and it does track down but it is difficult to tell if it is true bone or not.  Have correlated it with the films and it is hard to tell if there is hardware actually exposed.  None was visualized.  We will continue with the Endoform.  I will talk with Dr. Marlou Sa and we will plan on going to the OR for ACell placement.     Theodoro Kos, DO     CS/MEDQ  D:  01/30/2014  T:  01/31/2014  Job:  [id number]

## 2014-02-06 NOTE — Op Note (Addendum)
Operative Note   DATE OF OPERATION: 02/06/2014  LOCATION: [place]  SURGICAL DIVISION: Plastic Surgery  PREOPERATIVE DIAGNOSES:  Left leg wound secondary to open fracture 1 x 1 x 3 cm  POSTOPERATIVE DIAGNOSES:  same  PROCEDURE:  Preparation of left leg wound for placement of Acell (powder 500 mg and sheet 7 x 10)  SURGEON: Anndrea Mihelich Sanger, DO  ASSISTANT:  none  ANESTHESIA:  General.   COMPLICATIONS: None.   INDICATIONS FOR PROCEDURE:  The patient, [name], is a 81 y.o. female born on [birth year], is here for treatment of a open left leg tibial fracture wound.   CONSENT:  Informed consent was obtained directly from the patient. Risks, benefits and alternatives were fully discussed. Specific risks including but not limited to bleeding, infection, hematoma, seroma, scarring, pain, implant infection, implant extrusion, capsular contracture, asymmetry, wound healing problems, and need for further surgery were all discussed. The patient did have an ample opportunity to have questions answered to satisfaction.   DESCRIPTION OF PROCEDURE:  The patient was taken to the operating room. SCDs was placed on right leg. The patient's operative site was prepped and draped in a sterile fashion. A time out was performed and all information was confirmed to be correct.  General anesthesia was administered.  There was 1 cm of exposted bone and hardware at the site of the fracture.  The currette was used to remove the nonviable tissue in the wound 1 x 1 x 3 cm. The area was irrigated with antibiotic solution.   The Acell powder was placed followed by the Acell sheet.  Adaptic was placed with KY gel, kerlex and an ace wrap.  The patient tolerated the procedure well.  There were no complications. The patient was allowed to wake from anesthesia, extubated and taken to the recovery room in satisfactory condition.

## 2014-02-06 NOTE — Transfer of Care (Signed)
Immediate Anesthesia Transfer of Care Note  Patient: [name]  Procedure(s) Performed: Procedure(s) (LRB): IRRIGATION AND DEBRIDEMENT LEFT LEG WOUND  (Left) WITH PLACEMENT OF A -CELL  (Left)  Patient Location: PACU  Anesthesia Type: General  Level of Consciousness: awake, alert  and oriented  Airway & Oxygen Therapy: Patient Spontanous Breathing and Patient connected to face mask oxygen  Post-op Assessment: Report given to PACU RN and Post -op Vital signs reviewed and stable  Post vital signs: Reviewed and stable  Complications: No apparent anesthesia complications

## 2014-02-06 NOTE — Anesthesia Procedure Notes (Signed)
Procedure Name: LMA Insertion Date/Time: 02/06/2014 1:35 PM Performed by: Wanita Chamberlain Pre-anesthesia Checklist: Patient identified, Timeout performed, Emergency Drugs available, Suction available and Patient being monitored Patient Re-evaluated:Patient Re-evaluated prior to inductionOxygen Delivery Method: Circle system utilized Preoxygenation: Pre-oxygenation with 100% oxygen Intubation Type: IV induction Ventilation: Mask ventilation without difficulty LMA: LMA inserted LMA Size: 4.0 Number of attempts: 1 Placement Confirmation: positive ETCO2 Tube secured with: Tape Dental Injury: Teeth and Oropharynx as per pre-operative assessment

## 2014-02-07 ENCOUNTER — Other Ambulatory Visit: Payer: Self-pay | Admitting: Orthopedic Surgery

## 2014-02-07 ENCOUNTER — Encounter (HOSPITAL_BASED_OUTPATIENT_CLINIC_OR_DEPARTMENT_OTHER): Payer: Self-pay | Admitting: Plastic Surgery

## 2014-02-07 DIAGNOSIS — S82302K Unspecified fracture of lower end of left tibia, subsequent encounter for closed fracture with nonunion: Secondary | ICD-10-CM

## 2014-02-07 DIAGNOSIS — S82832K Other fracture of upper and lower end of left fibula, subsequent encounter for closed fracture with nonunion: Principal | ICD-10-CM

## 2014-02-07 NOTE — Anesthesia Postprocedure Evaluation (Signed)
Anesthesia Post Note  Patient: [name]  Procedure(s) Performed: Procedure(s) (LRB): IRRIGATION AND DEBRIDEMENT LEFT LEG WOUND  (Left) WITH PLACEMENT OF A -CELL  (Left)  Anesthesia type: general  Patient location: PACU  Post pain: Pain level controlled  Post assessment: Patient's Cardiovascular Status Stable  Last Vitals:  Filed Vitals:   02/06/14 1535  BP: 138/70  Pulse: 71  Temp: 36.6 C  Resp: 16    Post vital signs: Reviewed and stable  Level of consciousness: sedated  Complications: No apparent anesthesia complications

## 2014-02-09 ENCOUNTER — Other Ambulatory Visit: Payer: Medicare Other

## 2014-02-13 DIAGNOSIS — I872 Venous insufficiency (chronic) (peripheral): Secondary | ICD-10-CM | POA: Diagnosis not present

## 2014-02-13 DIAGNOSIS — L97829 Non-pressure chronic ulcer of other part of left lower leg with unspecified severity: Secondary | ICD-10-CM | POA: Diagnosis not present

## 2014-02-15 ENCOUNTER — Ambulatory Visit (INDEPENDENT_AMBULATORY_CARE_PROVIDER_SITE_OTHER): Payer: Medicare Other | Admitting: *Deleted

## 2014-02-15 ENCOUNTER — Ambulatory Visit
Admission: RE | Admit: 2014-02-15 | Discharge: 2014-02-15 | Disposition: A | Discharge status: Home / Self Care | Payer: Medicare Other | Source: Ambulatory Visit | Attending: Orthopedic Surgery | Admitting: Orthopedic Surgery

## 2014-02-15 DIAGNOSIS — Z5181 Encounter for therapeutic drug level monitoring: Secondary | ICD-10-CM | POA: Diagnosis not present

## 2014-02-15 DIAGNOSIS — S82832K Other fracture of upper and lower end of left fibula, subsequent encounter for closed fracture with nonunion: Secondary | ICD-10-CM | POA: Diagnosis not present

## 2014-02-15 DIAGNOSIS — S82302K Unspecified fracture of lower end of left tibia, subsequent encounter for closed fracture with nonunion: Secondary | ICD-10-CM

## 2014-02-15 DIAGNOSIS — I4892 Unspecified atrial flutter: Secondary | ICD-10-CM | POA: Diagnosis not present

## 2014-02-15 LAB — POCT INR: INR: 1.8

## 2014-02-16 NOTE — Progress Notes (Signed)
[facility]  NAME:  [name], [name] NO.:  MEDICAL RECORD NO.:  [id number]      DATE OF BIRTH:  1925-12-26  PHYSICIAN:  Theodoro Kos, DO            VISIT DATE:                                  OFFICE VISIT   The patient is an 81 year old female who is here for followup on her left lower extremity chronic venous ulcer.  She had an open fracture and she now has hardware in place that was visualized at the time of surgery unfortunately.  She has the ACell in place.  The redness is markedly improved, and there is no sign of infection.  She has an appointment with Dr. Marlou Sa in 1 week.  So, we will see what he says about the hardware.  In the meantime, she will continue with KY jelly, elevation, multivitamin, vitamin C, zinc which she has stopped so we asked her to continue with that and we will see her back in 1 week.     Theodoro Kos, DO     CS/MEDQ  D:  02/13/2014  T:  02/14/2014  Job:  [id number]

## 2014-02-16 NOTE — Telephone Encounter (Signed)
Pt called in wanted to know why the potassium was called in for 10mg  and not 20mg .  She would like call back from the nurse

## 2014-02-16 NOTE — Telephone Encounter (Signed)
[facility] and confirmed that 62meq was sent to the pharmacy erx done.   Spoke to PCP and he stated that pt had difficulty swallowing the 45meq and that is the reason for the rx strength change.

## 2014-02-16 NOTE — Telephone Encounter (Signed)
LVM for pt to call back.

## 2014-02-17 NOTE — Telephone Encounter (Signed)
PCP stated that the 76meq potassium is okay as long as we can confirm that the pt has no difficulty swallowing.

## 2014-02-20 ENCOUNTER — Ambulatory Visit (INDEPENDENT_AMBULATORY_CARE_PROVIDER_SITE_OTHER): Payer: Medicare Other | Admitting: Cardiovascular Disease

## 2014-02-20 ENCOUNTER — Encounter: Payer: Self-pay | Admitting: Cardiovascular Disease

## 2014-02-20 VITALS — BP 102/62 | HR 77 | Ht 63.0 in | Wt 191.2 lb

## 2014-02-20 DIAGNOSIS — I10 Essential (primary) hypertension: Secondary | ICD-10-CM

## 2014-02-20 DIAGNOSIS — I481 Persistent atrial fibrillation: Secondary | ICD-10-CM

## 2014-02-20 DIAGNOSIS — I4819 Other persistent atrial fibrillation: Secondary | ICD-10-CM

## 2014-02-20 NOTE — Progress Notes (Signed)
History of Present Illness: 81 yo WF with history of HTN, chronic low back pain, iron deficiency anemia and paroxysmal atrial fibrillation/flutter who is here today for follow up. She has had chronic atrial flutter/fibrillation over the past 3 years and has been managed on coumadin and rate control agents. In 2011 she had dark stools with anemia. Coumadin was held and upper endoscopy showed several small clean based gastric ulcers. She was admitted October 2011 and found to be dehydrated with acute renal failure. She was hydrated and this resolved. No further evidence of GI bleeding. She had several episodes of RVR with her atrial fib in the hospital. Coumadin has been restarted without evidence of further GI bleeding.  She had her right hip replaced per Dr. Rich Brave in May 2012. She has had bilateral lower ext swelling since her hip replacement in May 2012. She was started on lasix 40 mg po BID. This seemed to help the swelling. Venous dopplers negative for DVT. Fall June 2015 which led to an open fracture of the left leg tibia and fibula requiring surgical intervention and wound care with wound VAC. Patient was at [facility] when she developed LLQ pain. CT scan of the abdomen showed inflammatory changes in the pelvis and right lower quadrant with wall thickening of small bowel adjacent loops concerning for ruptured diverticulitis and abscess. General Surgery was consulted and she was started on IV Cipro and Flagyl. She had a drain placed transgluteally to drain a pelvic abscess by IR which was removed on 09/06/13.   She is here today for follow up. She has not been aware of any tachycardia or skipped beats. No chest pain or SOB. Her only complaint is her left leg which is not healing. No bleeding issues.   Primary Care Physician: [name]  Last Lipid Profile:Lipid Panel     Component Value Date/Time   CHOL 116 06/25/2012 1619   TRIG 82 09/05/2013 0300   HDL 41.50 06/25/2012 1619   CHOLHDL 3  06/25/2012 1619   VLDL 8.2 06/25/2012 1619   LDLCALC 66 06/25/2012 1619     Past Medical History  Diagnosis Date  . Hypertension   . LBP (low back pain)     severe lumbar spondylosis s/p L3/L4,L4/L5 fusion 12/10  . Osteopenia   . Hypothyroidism   . Chronic constipation   . History of colitis     DX  2003  WITH ISCHEMIC COLITIS  . History of GI bleed     2011--- UPPER SECONARY GASTRIC ULCER FROM NSAID USE  . History of Helicobacter pylori infection     2011  . GERD (gastroesophageal reflux disease)   . H/O hiatal hernia   . Diverticulosis of colon   . History of ulcer of lower limb     2012   RIGHT LOWER EXTREMITIY--  STATSIS ULCER  . Bilateral edema of lower extremity   . RBBB   . Persistent atrial fibrillation     CARDIOLOGIST-   DR Burman Foster  . Lumbar spondylolysis   . Anticoagulated on Coumadin   . Hyperlipidemia   . Open wound of left lower leg   . Iron deficiency anemia 2011    elevated MCV  . OA (osteoarthritis)     "hands" (08/25/2013)  . Anxiety   . History of diverticulitis of colon     perferated diverticulitis w/ abscess 08-25-2013 drain placed --  resolved without surgical intervention  . History of cellulitis     LEFT LOWER LEG --  SEPT 2015    Past Surgical History  Procedure Laterality Date  . Shoulder open rotator cuff repair Left 1997  . Total hip arthroplasty Right 06-07-2010  . External fixation leg Left 06/16/2013    Procedure: EXTERNAL FIXATION LEG;  Surgeon: Meredith Pel, MD;  Location: [place];  Service: Orthopedics;  Laterality: Left;  . I&d extremity Left 06/16/2013    Procedure: IRRIGATION AND DEBRIDEMENT EXTREMITY;  Surgeon: Meredith Pel, MD;  Location: [place];  Service: Orthopedics;  Laterality: Left;  . Application of wound vac Left 06/16/2013    Procedure: APPLICATION OF WOUND VAC;  Surgeon: Meredith Pel, MD;  Location: [place];  Service: Orthopedics;  Laterality: Left;  . Tibia im nail insertion Left 06/21/2013    Procedure:  INTRAMEDULLARY (IM) NAIL TIBIAL;  Surgeon: Meredith Pel, MD;  Location: [place];  Service: Orthopedics;  Laterality: Left;  . External fixation removal Left 06/21/2013    Procedure: REMOVAL EXTERNAL FIXATION LEG;  Surgeon: Meredith Pel, MD;  Location: [place];  Service: Orthopedics;  Laterality: Left;  . Application of a-cell of extremity Left 06/21/2013    Procedure: APPLICATION OF A-CELL OF EXTREMITY;  Surgeon: Theodoro Kos, DO;  Location: [place];  Service: Plastics;  Laterality: Left;  . Application of wound vac Left 06/21/2013    Procedure: APPLICATION OF WOUND VAC;  Surgeon: Theodoro Kos, DO;  Location: [place];  Service: Plastics;  Laterality: Left;  . Lumbar fusion  01-02-2009    L3-L5  . Transthoracic echocardiogram  09-11-2010   DR Alane Hanssen    LVSF  55-60%/  MILD MV CALCIFICATION WITH NO STENOSIS/  MILD MR & TR / MODERATE LAE/  MODERATE TO SEVERE RAE  . Tonsillectomy  AS CHILD  . Application of a-cell of extremity Left 08/22/2013    Procedure: APPLICATION OF A-CELL/VAC TO LOWER LEFT LEG WOUND;  Surgeon: Theodoro Kos, DO;  Location: [place];  Service: Plastics;  Laterality: Left;  . Upper gi endoscopy  03-29-2010  . I&d extremity Left 02/06/2014    Procedure: IRRIGATION AND DEBRIDEMENT LEFT LEG WOUND ;  Surgeon: Theodoro Kos, DO;  Location: [place];  Service: Plastics;  Laterality: Left;  . Application of a-cell of extremity Left 02/06/2014    Procedure: WITH PLACEMENT OF A -CELL ;  Surgeon: Theodoro Kos, DO;  Location: [place];  Service: Plastics;  Laterality: Left;    Current Outpatient Prescriptions  Medication Sig Dispense Refill  . acetaminophen (TYLENOL) 500 MG tablet Take 500 mg by mouth daily as needed for pain.    . Amino Acids-Protein Hydrolys (FEEDING SUPPLEMENT, PRO-STAT SUGAR FREE 64,) LIQD Take 30 mLs by mouth daily.    [name] atorvastatin (LIPITOR) 10 MG tablet Take 1 tablet (10 mg total) by mouth every evening. 90  tablet 3  . B Complex-C (B-COMPLEX WITH VITAMIN C) tablet Take 1 tablet by mouth daily.    . busPIRone (BUSPAR) 7.5 MG tablet Take 1 tablet (7.5 mg total) by mouth 2 (two) times daily. 180 tablet 3  . Cholecalciferol (VITAMIN D3) 1000 UNITS tablet Take 1,000 Units by mouth daily.      [name] diltiazem (DILACOR XR) 120 MG 24 hr capsule Take 120 mg by mouth every morning.     [name] doxycycline (DORYX) 100 MG EC tablet Take 100 mg by mouth 2 (two) times daily.    . Ferrous Sulfate (IRON) 325 (65 FE) MG TABS Take 1 tablet by mouth daily.    [name]  furosemide (LASIX) 40 MG tablet 1 po on Mon-Wed-Fri in am (Patient taking differently: Take 40 mg by mouth as directed. 1 po on Mon-Wed-Fri in am) 30 tablet 5  . levothyroxine (SYNTHROID, LEVOTHROID) 25 MCG tablet Take 25 mcg by mouth daily before breakfast.    . metoprolol (LOPRESSOR) 50 MG tablet Take 50 mg by mouth 2 (two) times daily.    . pantoprazole (PROTONIX) 40 MG tablet Take 1 tablet (40 mg total) by mouth daily before breakfast. 90 tablet 3  . polyethylene glycol (MIRALAX / GLYCOLAX) packet Take 17 g by mouth daily as needed.     . potassium chloride SA (K-DUR,KLOR-CON) 20 MEQ tablet Take 20 mEq by mouth every morning.    . temazepam (RESTORIL) 30 MG capsule Take 1 capsule (30 mg total) by mouth at bedtime. 30 capsule 5  . traMADol (ULTRAM) 50 MG tablet Take 1 tablet (50 mg total) by mouth every 6 (six) hours as needed for moderate pain. 60 tablet 2  . vitamin C (ASCORBIC ACID) 500 MG tablet Take 500 mg by mouth daily.    [name] warfarin (COUMADIN) 2.5 MG tablet Take 1-2 tablets (2.5-5 mg total) by mouth daily. Take 2.5mg  Monday, Tuesday, Wednesday, Thursday, Friday and Saturday.  Take 5mg  on Sunday.   Patient to resume this after completion of all her surgical procedures (Patient taking differently: Take 2.5-5 mg by mouth daily. Take 2.5mg  Monday, Tuesday, Wednesday, Thursday, Saturday and Sunday.  Take 5mg  on Monday and Friday) 1 tablet 0   No current  facility-administered medications for this visit.    Allergies  Allergen Reactions  . Atenolol Nausea And Vomiting  . Clarithromycin Nausea And Vomiting  . Codeine Sulfate Nausea Only  . Macrodantin Other (See Comments)    Long time ago - reaction unknown  . Percocet [Oxycodone-Acetaminophen] Nausea And Vomiting  . Zosyn [Piperacillin Sod-Tazobactam So] Rash    Morbilliform eruption with itching    History   Social History  . Marital Status: Married    Spouse Name: N/A    Number of Children: 2  . Years of Education: N/A   Occupational History  . retired Unemployed   Social History Main Topics  . Smoking status: Never Smoker   . Smokeless tobacco: Never Used  . Alcohol Use: 4.2 oz/week    7 Glasses of wine per week     Comment: 1 glass daily  . Drug Use: No  . Sexual Activity: Not on file   Other Topics Concern  . Not on file   Social History Narrative    Family History  Problem Relation Age of Onset  . Cancer Father   . Hypertension Other     Review of Systems:  As stated in the HPI and otherwise negative.   BP 102/62 mmHg  Pulse 77  Ht 5\' 3"  (1.6 m)  Wt 191 lb 3.2 oz (86.728 kg)  BMI 33.88 kg/m2  SpO2 95%  Physical Examination: General: Well developed, well nourished, NAD HEENT: OP clear, mucus membranes moist SKIN: warm, dry. No rashes. Neuro: No focal deficits Musculoskeletal: Muscle strength 5/5 all ext Psychiatric: Mood and affect normal Neck: No JVD, no carotid bruits, no thyromegaly, no lymphadenopathy. Lungs:Clear bilaterally, no wheezes, rhonci, crackles Cardiovascular: Irregular  rate and rhythm. No murmurs, gallops or rubs. Abdomen:Soft. Bowel sounds present. Non-tender.  Extremities: No lower extremity edema. Pulses are 2 + in the bilateral DP/PT. Chronic venous stasis changes.   Assessment and Plan:   1. Atrial fibrillation: Chronic  persistent, rate controlled. She is anti-coagulated with coumadin with no bleeding issues. Continue  beta blocker and Cardizem.   2. HTN: BP controlled. No changes today.

## 2014-02-20 NOTE — Patient Instructions (Signed)
Your physician wants you to follow-up in:  6 months. You will receive a reminder letter in the mail two months in advance. If you don't receive a letter, please call our office to schedule the follow-up appointment.   

## 2014-02-22 DIAGNOSIS — S82202E Unspecified fracture of shaft of left tibia, subsequent encounter for open fracture type I or II with routine healing: Secondary | ICD-10-CM | POA: Diagnosis not present

## 2014-02-27 ENCOUNTER — Encounter (HOSPITAL_BASED_OUTPATIENT_CLINIC_OR_DEPARTMENT_OTHER): Payer: Medicare Other | Attending: Plastic Surgery

## 2014-02-27 DIAGNOSIS — L97824 Non-pressure chronic ulcer of other part of left lower leg with necrosis of bone: Secondary | ICD-10-CM | POA: Insufficient documentation

## 2014-02-27 DIAGNOSIS — Y839 Surgical procedure, unspecified as the cause of abnormal reaction of the patient, or of later complication, without mention of misadventure at the time of the procedure: Secondary | ICD-10-CM | POA: Insufficient documentation

## 2014-02-27 DIAGNOSIS — T8189XD Other complications of procedures, not elsewhere classified, subsequent encounter: Secondary | ICD-10-CM | POA: Diagnosis not present

## 2014-02-27 DIAGNOSIS — S81802D Unspecified open wound, left lower leg, subsequent encounter: Secondary | ICD-10-CM | POA: Diagnosis not present

## 2014-02-28 NOTE — Progress Notes (Signed)
[facility]  NAME:  SAMANTHAN, DUGO NO.:  [id number]  MEDICAL RECORD NO.:  [id number]      DATE OF BIRTH:  Jun 06, 1925  PHYSICIAN:  Theodoro Kos, DO       VISIT DATE:  02/27/2014                                  OFFICE VISIT   The patient is an 81 year old female who is here for followup on her left lower extremity ulcer.  She underwent debridement with ACell placement.  She saw Dr. Marlou Sa and he is going to see her back in 2 months to reevaluate.  The ACell is still in place.  It is difficult to say whether or not it is actually incorporating.  It does not appear to be infected and there was no sign of redness.  So, we will continue with the KY jelly elevation, multivitamin, vitamin C, zinc, protein and see her back in 2 weeks.     Theodoro Kos, DO     CS/MEDQ  D:  02/27/2014  T:  02/28/2014  Job:  [id number]

## 2014-03-01 ENCOUNTER — Ambulatory Visit (INDEPENDENT_AMBULATORY_CARE_PROVIDER_SITE_OTHER): Payer: Medicare Other | Admitting: Pharmacist

## 2014-03-01 DIAGNOSIS — I4892 Unspecified atrial flutter: Secondary | ICD-10-CM

## 2014-03-01 DIAGNOSIS — Z5181 Encounter for therapeutic drug level monitoring: Secondary | ICD-10-CM | POA: Diagnosis not present

## 2014-03-01 LAB — POCT INR: INR: 2.5

## 2014-03-02 ENCOUNTER — Other Ambulatory Visit: Payer: Self-pay | Admitting: Dermatology

## 2014-03-02 DIAGNOSIS — D485 Neoplasm of uncertain behavior of skin: Secondary | ICD-10-CM | POA: Diagnosis not present

## 2014-03-02 DIAGNOSIS — C4441 Basal cell carcinoma of skin of scalp and neck: Secondary | ICD-10-CM | POA: Diagnosis not present

## 2014-03-02 DIAGNOSIS — Z85828 Personal history of other malignant neoplasm of skin: Secondary | ICD-10-CM | POA: Diagnosis not present

## 2014-03-02 DIAGNOSIS — C44519 Basal cell carcinoma of skin of other part of trunk: Secondary | ICD-10-CM | POA: Diagnosis not present

## 2014-03-20 DIAGNOSIS — S81802D Unspecified open wound, left lower leg, subsequent encounter: Secondary | ICD-10-CM | POA: Diagnosis not present

## 2014-03-20 DIAGNOSIS — T8189XD Other complications of procedures, not elsewhere classified, subsequent encounter: Secondary | ICD-10-CM | POA: Diagnosis not present

## 2014-03-20 DIAGNOSIS — L97824 Non-pressure chronic ulcer of other part of left lower leg with necrosis of bone: Secondary | ICD-10-CM | POA: Diagnosis not present

## 2014-03-21 ENCOUNTER — Other Ambulatory Visit: Payer: Self-pay | Admitting: Internal Medicine

## 2014-03-22 ENCOUNTER — Ambulatory Visit (INDEPENDENT_AMBULATORY_CARE_PROVIDER_SITE_OTHER): Payer: Medicare Other | Admitting: *Deleted

## 2014-03-22 DIAGNOSIS — Z5181 Encounter for therapeutic drug level monitoring: Secondary | ICD-10-CM | POA: Diagnosis not present

## 2014-03-22 DIAGNOSIS — I4892 Unspecified atrial flutter: Secondary | ICD-10-CM

## 2014-03-22 LAB — POCT INR: INR: 2.4

## 2014-03-29 ENCOUNTER — Ambulatory Visit (INDEPENDENT_AMBULATORY_CARE_PROVIDER_SITE_OTHER): Payer: Medicare Other | Admitting: Internal Medicine

## 2014-03-29 ENCOUNTER — Encounter: Payer: Self-pay | Admitting: Internal Medicine

## 2014-03-29 VITALS — BP 154/88 | HR 88 | Wt 193.5 lb

## 2014-03-29 DIAGNOSIS — N259 Disorder resulting from impaired renal tubular function, unspecified: Secondary | ICD-10-CM

## 2014-03-29 DIAGNOSIS — E038 Other specified hypothyroidism: Secondary | ICD-10-CM | POA: Diagnosis not present

## 2014-03-29 DIAGNOSIS — E034 Atrophy of thyroid (acquired): Secondary | ICD-10-CM | POA: Diagnosis not present

## 2014-03-29 DIAGNOSIS — I4892 Unspecified atrial flutter: Secondary | ICD-10-CM | POA: Diagnosis not present

## 2014-03-29 DIAGNOSIS — M5442 Lumbago with sciatica, left side: Secondary | ICD-10-CM

## 2014-03-29 NOTE — Assessment & Plan Note (Signed)
Tramadol prn 

## 2014-03-29 NOTE — Assessment & Plan Note (Signed)
BMET 

## 2014-03-29 NOTE — Assessment & Plan Note (Signed)
On Levothroid 

## 2014-03-29 NOTE — Progress Notes (Signed)
   Subjective:     HPI  F/u LBP, h/o LLE open fx - slow healing wound is still open  The patient is here to follow up on chronic LBP - a little better, situational depression due to LBP - better, anxiety, HTN controlled with medicines. LBP is bad w/activity  BP Readings from Last 3 Encounters:  03/29/14 154/88  02/20/14 102/62  02/06/14 138/70   Wt Readings from Last 3 Encounters:  03/29/14 193 lb 8 oz (87.771 kg)  02/20/14 191 lb 3.2 oz (86.728 kg)  02/06/14 190 lb (86.183 kg)      Review of Systems  Constitutional: Positive for fatigue. Negative for chills, activity change, appetite change and unexpected weight change.  HENT: Negative for congestion, mouth sores and sinus pressure.   Eyes: Negative for visual disturbance.  Respiratory: Negative for cough and chest tightness.   Gastrointestinal: Negative for nausea and abdominal pain.  Genitourinary: Negative for frequency, difficulty urinating and vaginal pain.  Musculoskeletal: Positive for back pain. Negative for gait problem.  Skin: Negative for pallor and rash.  Neurological: Negative for dizziness, tremors, weakness, numbness and headaches.  Psychiatric/Behavioral: Negative for suicidal ideas, confusion and sleep disturbance. The patient is not nervous/anxious.        Objective:   Physical Exam  Constitutional: She appears well-developed. No distress.  HENT:  Head: Normocephalic.  Right Ear: External ear normal.  Left Ear: External ear normal.  Nose: Nose normal.  Mouth/Throat: Oropharynx is clear and moist.  Eyes: Conjunctivae are normal. Pupils are equal, round, and reactive to light. Right eye exhibits no discharge. Left eye exhibits no discharge.  Neck: Normal range of motion. Neck supple. No JVD present. No tracheal deviation present. No thyromegaly present.  Cardiovascular: Normal rate, regular rhythm and normal heart sounds.   Pulmonary/Chest: No stridor. No respiratory distress. She has no wheezes.   Abdominal: Soft. Bowel sounds are normal. She exhibits no distension and no mass. There is no tenderness. There is no rebound and no guarding.  Musculoskeletal: She exhibits no edema or tenderness.  Lymphadenopathy:    She has no cervical adenopathy.  Neurological: She displays normal reflexes. No cranial nerve deficit. She exhibits normal muscle tone. Coordination abnormal.  Walker   Skin: No rash noted. No erythema.  Psychiatric: She has a normal mood and affect. Her behavior is normal. Judgment and thought content normal.  LLE wound is dressed Using a walker   Lab Results  Component Value Date   WBC 6.4 01/17/2014   HGB 12.3 01/17/2014   HCT 38.0 01/17/2014   PLT 263.0 01/17/2014   GLUCOSE 137* 01/17/2014   CHOL 116 06/25/2012   TRIG 82 09/05/2013   HDL 41.50 06/25/2012   LDLDIRECT 110.3 05/24/2008   LDLCALC 66 06/25/2012   ALT 19 01/17/2014   AST 24 01/17/2014   NA 137 01/17/2014   K 3.3* 01/17/2014   CL 99 01/17/2014   CREATININE 1.2 01/17/2014   BUN 25* 01/17/2014   CO2 29 01/17/2014   TSH 0.51 01/17/2014   INR 2.4 03/22/2014   HGBA1C 6.1* 09/08/2013       Assessment & Plan:

## 2014-03-29 NOTE — Assessment & Plan Note (Signed)
Continue with current prescription therapy as reflected on the Med list.  

## 2014-03-29 NOTE — Progress Notes (Signed)
Pre visit review using our clinic review tool, if applicable. No additional management support is needed unless otherwise documented below in the visit note. 

## 2014-04-11 ENCOUNTER — Other Ambulatory Visit: Payer: Self-pay | Admitting: Cardiovascular Disease

## 2014-04-17 ENCOUNTER — Encounter (HOSPITAL_BASED_OUTPATIENT_CLINIC_OR_DEPARTMENT_OTHER): Payer: Medicare Other | Attending: Plastic Surgery

## 2014-04-17 ENCOUNTER — Other Ambulatory Visit: Payer: Self-pay | Admitting: Orthopedic Surgery

## 2014-04-17 DIAGNOSIS — L97924 Non-pressure chronic ulcer of unspecified part of left lower leg with necrosis of bone: Secondary | ICD-10-CM | POA: Insufficient documentation

## 2014-04-17 DIAGNOSIS — T8189XS Other complications of procedures, not elsewhere classified, sequela: Secondary | ICD-10-CM | POA: Insufficient documentation

## 2014-04-17 DIAGNOSIS — Y839 Surgical procedure, unspecified as the cause of abnormal reaction of the patient, or of later complication, without mention of misadventure at the time of the procedure: Secondary | ICD-10-CM | POA: Diagnosis not present

## 2014-04-17 DIAGNOSIS — R52 Pain, unspecified: Secondary | ICD-10-CM

## 2014-04-19 ENCOUNTER — Ambulatory Visit (INDEPENDENT_AMBULATORY_CARE_PROVIDER_SITE_OTHER): Payer: Medicare Other | Admitting: Pharmacist

## 2014-04-19 DIAGNOSIS — Z5181 Encounter for therapeutic drug level monitoring: Secondary | ICD-10-CM | POA: Diagnosis not present

## 2014-04-19 DIAGNOSIS — I4892 Unspecified atrial flutter: Secondary | ICD-10-CM | POA: Diagnosis not present

## 2014-04-19 LAB — POCT INR: INR: 2.9

## 2014-04-20 ENCOUNTER — Ambulatory Visit
Admission: RE | Admit: 2014-04-20 | Discharge: 2014-04-20 | Disposition: A | Discharge status: Home / Self Care | Payer: Medicare Other | Source: Ambulatory Visit | Attending: Orthopedic Surgery | Admitting: Orthopedic Surgery

## 2014-04-20 DIAGNOSIS — S82302G Unspecified fracture of lower end of left tibia, subsequent encounter for closed fracture with delayed healing: Secondary | ICD-10-CM | POA: Diagnosis not present

## 2014-04-20 DIAGNOSIS — L97924 Non-pressure chronic ulcer of unspecified part of left lower leg with necrosis of bone: Secondary | ICD-10-CM | POA: Diagnosis not present

## 2014-04-20 DIAGNOSIS — R52 Pain, unspecified: Secondary | ICD-10-CM

## 2014-04-26 DIAGNOSIS — S82202S Unspecified fracture of shaft of left tibia, sequela: Secondary | ICD-10-CM | POA: Diagnosis not present

## 2014-04-27 ENCOUNTER — Other Ambulatory Visit (HOSPITAL_COMMUNITY): Payer: Self-pay | Admitting: Orthopedic Surgery

## 2014-05-08 ENCOUNTER — Encounter (HOSPITAL_BASED_OUTPATIENT_CLINIC_OR_DEPARTMENT_OTHER): Payer: Medicare Other | Attending: Plastic Surgery

## 2014-05-08 DIAGNOSIS — T8189XD Other complications of procedures, not elsewhere classified, subsequent encounter: Secondary | ICD-10-CM | POA: Insufficient documentation

## 2014-05-08 DIAGNOSIS — L97924 Non-pressure chronic ulcer of unspecified part of left lower leg with necrosis of bone: Secondary | ICD-10-CM | POA: Diagnosis present

## 2014-05-08 DIAGNOSIS — Y838 Other surgical procedures as the cause of abnormal reaction of the patient, or of later complication, without mention of misadventure at the time of the procedure: Secondary | ICD-10-CM | POA: Diagnosis not present

## 2014-05-08 DIAGNOSIS — L97824 Non-pressure chronic ulcer of other part of left lower leg with necrosis of bone: Secondary | ICD-10-CM | POA: Diagnosis not present

## 2014-05-19 ENCOUNTER — Encounter (HOSPITAL_COMMUNITY)
Admission: RE | Admit: 2014-05-19 | Discharge: 2014-05-19 | Disposition: A | Discharge status: Home / Self Care | Payer: Medicare Other | Source: Ambulatory Visit | Attending: Orthopedic Surgery | Admitting: Orthopedic Surgery

## 2014-05-19 ENCOUNTER — Encounter (HOSPITAL_COMMUNITY): Payer: Self-pay

## 2014-05-19 LAB — CBC
HCT: 42.3 % (ref 36.0–46.0)
Hemoglobin: 13.9 g/dL (ref 12.0–15.0)
MCH: 32.9 pg (ref 26.0–34.0)
MCHC: 32.9 g/dL (ref 30.0–36.0)
MCV: 100 fL (ref 78.0–100.0)
Platelets: 238 10*3/uL (ref 150–400)
RBC: 4.23 MIL/uL (ref 3.87–5.11)
RDW: 13.6 % (ref 11.5–15.5)
WBC: 5.4 10*3/uL (ref 4.0–10.5)

## 2014-05-19 LAB — BASIC METABOLIC PANEL
Anion gap: 9 (ref 5–15)
BUN: 10 mg/dL (ref 6–23)
CO2: 25 mmol/L (ref 19–32)
Calcium: 9 mg/dL (ref 8.4–10.5)
Chloride: 104 mmol/L (ref 96–112)
Creatinine, Ser: 0.87 mg/dL (ref 0.50–1.10)
GFR calc Af Amer: 68 mL/min — ABNORMAL LOW (ref >90)
GFR calc non Af Amer: 59 mL/min — ABNORMAL LOW (ref >90)
Glucose, Bld: 91 mg/dL (ref 70–99)
Potassium: 3.9 mmol/L (ref 3.5–5.1)
Sodium: 138 mmol/L (ref 135–145)

## 2014-05-19 LAB — PROTIME-INR
INR: 2.04 — ABNORMAL HIGH (ref 0.00–1.49)
Prothrombin Time: 23.2 seconds — ABNORMAL HIGH (ref 11.6–15.2)

## 2014-05-19 LAB — APTT: aPTT: 39 seconds — ABNORMAL HIGH (ref 24–37)

## 2014-05-19 NOTE — Pre-Procedure Instructions (Addendum)
[name]  05/19/2014   Your procedure is scheduled on:  05/23/14  Report to [facility] cone short stay admitting at 1000 AM.  Call this number if you have problems the morning of surgery: [phone]   Remember:   Do not eat food or drink liquids after midnight.   Take these medicines the morning of surgery with A SIP OF WATER: pain med if needed, buspar, diltiazem, doxycycline, levothyroxin, metoprolol    STOP all herbel meds, nsaids (aleve,naproxen,advil,ibuprofen) now including all vitamins   COUMADIN PER DR   Do not wear jewelry, make-up or nail polish.  Do not wear lotions, powders, or perfumes. You may wear deodorant.  Do not shave 48 hours prior to surgery. Men may shave face and neck.  Do not bring valuables to the hospital.  [facility] is not responsible                  for any belongings or valuables.               Contacts, dentures or bridgework may not be worn into surgery.  Leave suitcase in the car. After surgery it may be brought to your room.  For patients admitted to the hospital, discharge time is determined by your                treatment team.               Patients discharged the day of surgery will not be allowed to drive  home.  Name and phone number of your driver:   Special Instructions:  Special Instructions: [place] - Preparing for Surgery  Before surgery, you can play an important role.  Because skin is not sterile, your skin needs to be as free of germs as possible.  You can reduce the number of germs on you skin by washing with CHG (chlorahexidine gluconate) soap before surgery.  CHG is an antiseptic cleaner which kills germs and bonds with the skin to continue killing germs even after washing.  Please DO NOT use if you have an allergy to CHG or antibacterial soaps.  If your skin becomes reddened/irritated stop using the CHG and inform your nurse when you arrive at Short Stay.  Do not shave (including legs and underarms) for at least 48 hours  prior to the first CHG shower.  You may shave your face.  Please follow these instructions carefully:   1.  Shower with CHG Soap the night before surgery and the morning of Surgery.  2.  If you choose to wash your hair, wash your hair first as usual with your normal shampoo.  3.  After you shampoo, rinse your hair and body thoroughly to remove the Shampoo.  4.  Use CHG as you would any other liquid soap.  You can apply chg directly  to the skin and wash gently with scrungie or a clean washcloth.  5.  Apply the CHG Soap to your body ONLY FROM THE NECK DOWN.  Do not use on open wounds or open sores.  Avoid contact with your eyes ears, mouth and genitals (private parts).  Wash genitals (private parts)       with your normal soap.  6.  Wash thoroughly, paying special attention to the area where your surgery will be performed.  7.  Thoroughly rinse your body with warm water from the neck down.  8.  DO NOT shower/wash with your normal soap after using and rinsing off  the CHG Soap.  9.  Pat yourself dry with a clean towel.            10.  Wear clean pajamas.            11.  Place clean sheets on your bed the night of your first shower and do not sleep with pets.  Day of Surgery  Do not apply any lotions/deodorants the morning of surgery.  Please wear clean clothes to the hospital/surgery center.   Please read over the following fact sheets that you were given: Pain Booklet, Coughing and Deep Breathing and Surgical Site Infection Prevention

## 2014-05-20 ENCOUNTER — Other Ambulatory Visit: Payer: Self-pay | Admitting: Cardiovascular Disease

## 2014-05-22 MED ORDER — CHLORHEXIDINE GLUCONATE 4 % EX LIQD
60.0000 mL | Freq: Once | CUTANEOUS | Status: DC
  Filled 2014-05-22: qty 60

## 2014-05-22 MED ORDER — CHLORHEXIDINE GLUCONATE 4 % EX LIQD
60.0000 mL | Freq: Once | CUTANEOUS | Status: DC
Start: 2014-05-22 — End: 2014-05-23
  Filled 2014-05-22: qty 60

## 2014-05-22 MED ORDER — VANCOMYCIN HCL IN DEXTROSE 1-5 GM/200ML-% IV SOLN: 1000.0000 mg | INTRAVENOUS | Status: DC

## 2014-05-22 NOTE — Progress Notes (Signed)
Anesthesia Chart Review:  Patient is a 81 year old female scheduled for left tibia removal of hardware, possible bone grafting on 05/23/14 by Dr. Marlou Sa.  DX: Left tibial infection.  She fell and sustained an open distal tib-fib fracture in 05/2013. She underwent external fixation, I&D, and application of wound VAC 06/16/13; removal of external fixation, debridement, IM nailing of left tib-fib fracture 04/25/78; application of A-cell/VAC 08/22/13; I&D, placement of A-cell 02/06/14.   Other history includes non-smoker, paroxysmal afib/flutter (chronic for the past three years), iron deficiency anemia, GI bleed, HTN, hypothyroidism, anxiety, acute renal failure '11 in the setting of dehydration, right THR '12, pelvic abscess felt likely rupture diverticulitis s/p abx and drain by IR s/p removal 08/2013, lumbar fusion '10.   PCP is Dr. Alain Marion, last visit 03/29/14. Cardiologist is Dr. Angelena Form, last visit 02/20/14.   Meds include Lipitor, Buspar, diltiazem, doxycycline, Iron, Lasix, KCl, levothyroxine, Toprol XL, Restoril, tramadol, warfarin (on hold for surgery since 05/18/14).   02/20/14 EKG: afib, RBBB. She has a known RBBB and chronic afib.  HR at PAT was 68 bpm.  Her last echo was > 3 years ago.  08/27/13 1V CXR: IMPRESSION: Right PICC line with tip overlying the superior cavoatrial junction. Increasing subsegmental right basilar atelectasis.  Preoperative labs noted. Recheck PT/INR on arrival.   She has undergone multiple procedure in the past year. Her afib was rate controlled at PAT.  Further evaluation on the day of surgery by her anesthesiologist to ensure no acute changes prior to her surgery.  George Hugh [facility] Short Stay Center/Anesthesiology Phone [phone] 05/22/2014 11:07 AM

## 2014-05-22 NOTE — Telephone Encounter (Signed)
Chart reviewed and last office visit with Dr. Angelena Form indicates pt is on lopressor 50 mg twice daily.  I spoke with [facility] and their records indicate pt filled Toprol 50 mg twice daily on 02/25/14.  Prior to this it was last filled on 04/24/13.  I spoke with pt and she checked her bottle and reports she has been taking Toprol 50 mg by mouth twice daily. Will send refill for Toprol to [facility]

## 2014-05-23 ENCOUNTER — Inpatient Hospital Stay (HOSPITAL_COMMUNITY): Payer: Medicare Other

## 2014-05-23 ENCOUNTER — Inpatient Hospital Stay (HOSPITAL_COMMUNITY): Payer: Medicare Other | Admitting: Vascular Surgery

## 2014-05-23 ENCOUNTER — Inpatient Hospital Stay (HOSPITAL_COMMUNITY): Payer: Medicare Other | Admitting: Anesthesiology

## 2014-05-23 ENCOUNTER — Inpatient Hospital Stay (HOSPITAL_COMMUNITY)
Admission: RE | Admit: 2014-05-23 | Discharge: 2014-05-26 | DRG: 494 | Disposition: A | Discharge status: Skilled Nursing Facility | Payer: Medicare Other | Source: Ambulatory Visit | Attending: Orthopedic Surgery | Admitting: Orthopedic Surgery

## 2014-05-23 ENCOUNTER — Encounter (HOSPITAL_COMMUNITY)
Admission: RE | Disposition: A | Discharge status: Skilled Nursing Facility | Payer: Self-pay | Source: Ambulatory Visit | Attending: Orthopedic Surgery

## 2014-05-23 ENCOUNTER — Observation Stay (HOSPITAL_COMMUNITY): Payer: Medicare Other

## 2014-05-23 ENCOUNTER — Encounter (HOSPITAL_COMMUNITY): Payer: Self-pay | Admitting: *Deleted

## 2014-05-23 DIAGNOSIS — S82202S Unspecified fracture of shaft of left tibia, sequela: Secondary | ICD-10-CM | POA: Diagnosis not present

## 2014-05-23 DIAGNOSIS — S82392D Other fracture of lower end of left tibia, subsequent encounter for closed fracture with routine healing: Secondary | ICD-10-CM | POA: Diagnosis not present

## 2014-05-23 DIAGNOSIS — I4510 Unspecified right bundle-branch block: Secondary | ICD-10-CM | POA: Diagnosis present

## 2014-05-23 DIAGNOSIS — I10 Essential (primary) hypertension: Secondary | ICD-10-CM | POA: Diagnosis not present

## 2014-05-23 DIAGNOSIS — M8580 Other specified disorders of bone density and structure, unspecified site: Secondary | ICD-10-CM | POA: Diagnosis present

## 2014-05-23 DIAGNOSIS — K5900 Constipation, unspecified: Secondary | ICD-10-CM | POA: Diagnosis present

## 2014-05-23 DIAGNOSIS — I480 Paroxysmal atrial fibrillation: Secondary | ICD-10-CM | POA: Diagnosis not present

## 2014-05-23 DIAGNOSIS — Z881 Allergy status to other antibiotic agents status: Secondary | ICD-10-CM

## 2014-05-23 DIAGNOSIS — T84623A Infection and inflammatory reaction due to internal fixation device of left tibia, initial encounter: Secondary | ICD-10-CM | POA: Diagnosis not present

## 2014-05-23 DIAGNOSIS — K219 Gastro-esophageal reflux disease without esophagitis: Secondary | ICD-10-CM | POA: Diagnosis not present

## 2014-05-23 DIAGNOSIS — D509 Iron deficiency anemia, unspecified: Secondary | ICD-10-CM | POA: Diagnosis present

## 2014-05-23 DIAGNOSIS — Z464 Encounter for fitting and adjustment of orthodontic device: Secondary | ICD-10-CM | POA: Diagnosis not present

## 2014-05-23 DIAGNOSIS — Y838 Other surgical procedures as the cause of abnormal reaction of the patient, or of later complication, without mention of misadventure at the time of the procedure: Secondary | ICD-10-CM | POA: Diagnosis present

## 2014-05-23 DIAGNOSIS — T847XXA Infection and inflammatory reaction due to other internal orthopedic prosthetic devices, implants and grafts, initial encounter: Secondary | ICD-10-CM | POA: Diagnosis not present

## 2014-05-23 DIAGNOSIS — M19041 Primary osteoarthritis, right hand: Secondary | ICD-10-CM | POA: Diagnosis present

## 2014-05-23 DIAGNOSIS — S82832D Other fracture of upper and lower end of left fibula, subsequent encounter for closed fracture with routine healing: Secondary | ICD-10-CM | POA: Diagnosis not present

## 2014-05-23 DIAGNOSIS — F419 Anxiety disorder, unspecified: Secondary | ICD-10-CM | POA: Diagnosis present

## 2014-05-23 DIAGNOSIS — Z885 Allergy status to narcotic agent status: Secondary | ICD-10-CM

## 2014-05-23 DIAGNOSIS — M898X6 Other specified disorders of bone, lower leg: Secondary | ICD-10-CM | POA: Diagnosis present

## 2014-05-23 DIAGNOSIS — Z419 Encounter for procedure for purposes other than remedying health state, unspecified: Secondary | ICD-10-CM

## 2014-05-23 DIAGNOSIS — S82209A Unspecified fracture of shaft of unspecified tibia, initial encounter for closed fracture: Secondary | ICD-10-CM

## 2014-05-23 DIAGNOSIS — Z981 Arthrodesis status: Secondary | ICD-10-CM

## 2014-05-23 DIAGNOSIS — E039 Hypothyroidism, unspecified: Secondary | ICD-10-CM | POA: Diagnosis present

## 2014-05-23 DIAGNOSIS — Z888 Allergy status to other drugs, medicaments and biological substances status: Secondary | ICD-10-CM

## 2014-05-23 DIAGNOSIS — Z96641 Presence of right artificial hip joint: Secondary | ICD-10-CM | POA: Diagnosis present

## 2014-05-23 DIAGNOSIS — M19042 Primary osteoarthritis, left hand: Secondary | ICD-10-CM | POA: Diagnosis present

## 2014-05-23 DIAGNOSIS — K5730 Diverticulosis of large intestine without perforation or abscess without bleeding: Secondary | ICD-10-CM | POA: Diagnosis present

## 2014-05-23 DIAGNOSIS — Z880 Allergy status to penicillin: Secondary | ICD-10-CM

## 2014-05-23 DIAGNOSIS — Z79899 Other long term (current) drug therapy: Secondary | ICD-10-CM

## 2014-05-23 DIAGNOSIS — M545 Low back pain: Secondary | ICD-10-CM | POA: Diagnosis not present

## 2014-05-23 DIAGNOSIS — E785 Hyperlipidemia, unspecified: Secondary | ICD-10-CM | POA: Diagnosis present

## 2014-05-23 DIAGNOSIS — Z7901 Long term (current) use of anticoagulants: Secondary | ICD-10-CM

## 2014-05-23 HISTORY — PX: HARDWARE REMOVAL: SHX979

## 2014-05-23 LAB — PROTIME-INR
INR: 1.21 (ref 0.00–1.49)
Prothrombin Time: 15.4 seconds — ABNORMAL HIGH (ref 11.6–15.2)

## 2014-05-23 LAB — APTT: aPTT: 32 seconds (ref 24–37)

## 2014-05-23 SURGERY — REMOVAL, HARDWARE
Anesthesia: General | Site: Leg Lower | Laterality: Left

## 2014-05-23 MED ORDER — FENTANYL CITRATE (PF) 100 MCG/2ML IJ SOLN
INTRAMUSCULAR | Status: DC | PRN
  Administered 2014-05-23: 25 ug via INTRAVENOUS
  Administered 2014-05-23: 75 ug via INTRAVENOUS
  Administered 2014-05-23: 25 ug via INTRAVENOUS
  Administered 2014-05-23: 50 ug via INTRAVENOUS
  Administered 2014-05-23 (×3): 25 ug via INTRAVENOUS

## 2014-05-23 MED ORDER — FENTANYL CITRATE (PF) 100 MCG/2ML IJ SOLN
INTRAMUSCULAR | Status: AC
  Administered 2014-05-23: 50 ug via INTRAVENOUS
  Filled 2014-05-23: qty 2

## 2014-05-23 MED ORDER — FUROSEMIDE 40 MG PO TABS
40.0000 mg | ORAL_TABLET | ORAL | Status: DC
  Administered 2014-05-24 – 2014-05-26 (×2): 40 mg via ORAL
  Filled 2014-05-23 (×2): qty 1

## 2014-05-23 MED ORDER — ACETAMINOPHEN 325 MG PO TABS: 650.0000 mg | ORAL_TABLET | Freq: Four times a day (QID) | ORAL | Status: DC | PRN

## 2014-05-23 MED ORDER — ASPIRIN 325 MG PO TABS
325.0000 mg | ORAL_TABLET | Freq: Every day | ORAL | Status: DC
  Administered 2014-05-23: 325 mg via ORAL
  Filled 2014-05-23: qty 1

## 2014-05-23 MED ORDER — PROPOFOL 10 MG/ML IV BOLUS
INTRAVENOUS | Status: AC
  Filled 2014-05-23: qty 20

## 2014-05-23 MED ORDER — VANCOMYCIN HCL IN DEXTROSE 1-5 GM/200ML-% IV SOLN
1000.0000 mg | Freq: Two times a day (BID) | INTRAVENOUS | Status: AC
  Administered 2014-05-24: 1000 mg via INTRAVENOUS
  Filled 2014-05-23: qty 200

## 2014-05-23 MED ORDER — LACTATED RINGERS IV SOLN
INTRAVENOUS | Status: DC
  Administered 2014-05-23 (×3): via INTRAVENOUS

## 2014-05-23 MED ORDER — FENTANYL CITRATE (PF) 250 MCG/5ML IJ SOLN
INTRAMUSCULAR | Status: AC
  Filled 2014-05-23: qty 5

## 2014-05-23 MED ORDER — METOPROLOL SUCCINATE ER 25 MG PO TB24
50.0000 mg | ORAL_TABLET | Freq: Every day | ORAL | Status: DC
  Administered 2014-05-24 – 2014-05-25 (×2): 50 mg via ORAL
  Filled 2014-05-23 (×3): qty 2

## 2014-05-23 MED ORDER — DILTIAZEM HCL ER COATED BEADS 120 MG PO CP24
120.0000 mg | ORAL_CAPSULE | Freq: Every day | ORAL | Status: DC
  Administered 2014-05-23 – 2014-05-25 (×3): 120 mg via ORAL
  Filled 2014-05-23 (×5): qty 1

## 2014-05-23 MED ORDER — ACETAMINOPHEN 650 MG RE SUPP: 650.0000 mg | Freq: Four times a day (QID) | RECTAL | Status: DC | PRN

## 2014-05-23 MED ORDER — LIDOCAINE HCL (CARDIAC) 20 MG/ML IV SOLN
INTRAVENOUS | Status: DC | PRN
  Administered 2014-05-23: 70 mg via INTRAVENOUS

## 2014-05-23 MED ORDER — ATORVASTATIN CALCIUM 10 MG PO TABS
10.0000 mg | ORAL_TABLET | Freq: Every evening | ORAL | Status: DC
  Administered 2014-05-23 – 2014-05-25 (×3): 10 mg via ORAL
  Filled 2014-05-23 (×3): qty 1

## 2014-05-23 MED ORDER — ONDANSETRON HCL 4 MG PO TABS
4.0000 mg | ORAL_TABLET | Freq: Four times a day (QID) | ORAL | Status: DC | PRN
  Administered 2014-05-26: 4 mg via ORAL
  Filled 2014-05-23: qty 1

## 2014-05-23 MED ORDER — VITAMIN D3 25 MCG (1000 UNIT) PO TABS
1000.0000 [IU] | ORAL_TABLET | Freq: Every day | ORAL | Status: DC
  Administered 2014-05-24 – 2014-05-26 (×3): 1000 [IU] via ORAL
  Filled 2014-05-23 (×6): qty 1

## 2014-05-23 MED ORDER — POTASSIUM CHLORIDE IN NACL 20-0.9 MEQ/L-% IV SOLN: INTRAVENOUS | Status: DC

## 2014-05-23 MED ORDER — ONDANSETRON HCL 4 MG/2ML IJ SOLN: 4.0000 mg | Freq: Four times a day (QID) | INTRAMUSCULAR | Status: DC | PRN

## 2014-05-23 MED ORDER — CEFAZOLIN SODIUM-DEXTROSE 2-3 GM-% IV SOLR
INTRAVENOUS | Status: DC | PRN
  Administered 2014-05-23: 2 g via INTRAVENOUS

## 2014-05-23 MED ORDER — NEOSTIGMINE METHYLSULFATE 10 MG/10ML IV SOLN
INTRAVENOUS | Status: AC
  Filled 2014-05-23: qty 1

## 2014-05-23 MED ORDER — ONDANSETRON HCL 4 MG/2ML IJ SOLN
INTRAMUSCULAR | Status: DC | PRN
  Administered 2014-05-23: 4 mg via INTRAVENOUS

## 2014-05-23 MED ORDER — METOCLOPRAMIDE HCL 5 MG PO TABS: 5.0000 mg | ORAL_TABLET | Freq: Three times a day (TID) | ORAL | Status: DC | PRN

## 2014-05-23 MED ORDER — POTASSIUM CHLORIDE IN NACL 20-0.9 MEQ/L-% IV SOLN
INTRAVENOUS | Status: AC
  Administered 2014-05-23: 20:00:00 via INTRAVENOUS
  Filled 2014-05-23: qty 1000

## 2014-05-23 MED ORDER — ONDANSETRON HCL 4 MG/2ML IJ SOLN
INTRAMUSCULAR | Status: AC
  Filled 2014-05-23: qty 2

## 2014-05-23 MED ORDER — ONDANSETRON HCL 4 MG PO TABS: 4.0000 mg | ORAL_TABLET | Freq: Four times a day (QID) | ORAL | Status: DC | PRN

## 2014-05-23 MED ORDER — GLYCOPYRROLATE 0.2 MG/ML IJ SOLN
INTRAMUSCULAR | Status: AC
  Filled 2014-05-23: qty 2

## 2014-05-23 MED ORDER — PROPOFOL 10 MG/ML IV BOLUS
INTRAVENOUS | Status: DC | PRN
  Administered 2014-05-23: 180 mg via INTRAVENOUS

## 2014-05-23 MED ORDER — CEFAZOLIN SODIUM-DEXTROSE 2-3 GM-% IV SOLR
2.0000 g | Freq: Four times a day (QID) | INTRAVENOUS | Status: AC
Start: 2014-05-23 — End: 2014-05-24
  Administered 2014-05-23 – 2014-05-24 (×2): 2 g via INTRAVENOUS
  Filled 2014-05-23 (×2): qty 50

## 2014-05-23 MED ORDER — 0.9 % SODIUM CHLORIDE (POUR BTL) OPTIME
TOPICAL | Status: DC | PRN
  Administered 2014-05-23: 1000 mL

## 2014-05-23 MED ORDER — HYDROCODONE-ACETAMINOPHEN 10-325 MG PO TABS
1.0000 | ORAL_TABLET | ORAL | Status: DC | PRN
Start: 2014-05-23 — End: 2014-05-26

## 2014-05-23 MED ORDER — ACETAMINOPHEN 325 MG PO TABS
650.0000 mg | ORAL_TABLET | Freq: Four times a day (QID) | ORAL | Status: DC | PRN
  Administered 2014-05-23 – 2014-05-26 (×5): 650 mg via ORAL
  Filled 2014-05-23 (×7): qty 2

## 2014-05-23 MED ORDER — GENTAMICIN SULFATE 40 MG/ML IJ SOLN
INTRAMUSCULAR | Status: DC | PRN
  Administered 2014-05-23: 240 mg

## 2014-05-23 MED ORDER — VANCOMYCIN HCL 1000 MG IV SOLR
INTRAVENOUS | Status: DC | PRN
  Administered 2014-05-23: 1000 mg

## 2014-05-23 MED ORDER — POTASSIUM CHLORIDE CRYS ER 10 MEQ PO TBCR
10.0000 meq | EXTENDED_RELEASE_TABLET | Freq: Every day | ORAL | Status: DC
  Administered 2014-05-24 – 2014-05-26 (×3): 10 meq via ORAL
  Filled 2014-05-23 (×3): qty 1

## 2014-05-23 MED ORDER — B COMPLEX-C PO TABS
1.0000 | ORAL_TABLET | Freq: Every day | ORAL | Status: DC
  Administered 2014-05-24 – 2014-05-26 (×3): 1 via ORAL
  Filled 2014-05-23 (×4): qty 1

## 2014-05-23 MED ORDER — LEVOTHYROXINE SODIUM 25 MCG PO TABS
25.0000 ug | ORAL_TABLET | Freq: Every day | ORAL | Status: DC
  Administered 2014-05-24 – 2014-05-26 (×3): 25 ug via ORAL
  Filled 2014-05-23 (×3): qty 1

## 2014-05-23 MED ORDER — ROCURONIUM BROMIDE 50 MG/5ML IV SOLN
INTRAVENOUS | Status: AC
  Filled 2014-05-23: qty 1

## 2014-05-23 MED ORDER — POLYETHYLENE GLYCOL 3350 17 G PO PACK
17.0000 g | PACK | Freq: Every day | ORAL | Status: DC | PRN
  Filled 2014-05-23: qty 1

## 2014-05-23 MED ORDER — METOCLOPRAMIDE HCL 5 MG/ML IJ SOLN: 5.0000 mg | Freq: Three times a day (TID) | INTRAMUSCULAR | Status: DC | PRN

## 2014-05-23 MED ORDER — HYDROMORPHONE HCL 1 MG/ML IJ SOLN: 0.5000 mg | INTRAMUSCULAR | Status: DC | PRN

## 2014-05-23 MED ORDER — WARFARIN SODIUM 5 MG PO TABS
5.0000 mg | ORAL_TABLET | Freq: Once | ORAL | Status: AC
  Administered 2014-05-23: 5 mg via ORAL
  Filled 2014-05-23: qty 1

## 2014-05-23 MED ORDER — GENTAMICIN SULFATE 40 MG/ML IJ SOLN
INTRAMUSCULAR | Status: AC
  Filled 2014-05-23: qty 6

## 2014-05-23 MED ORDER — BUSPIRONE HCL 15 MG PO TABS
7.5000 mg | ORAL_TABLET | Freq: Two times a day (BID) | ORAL | Status: DC
  Administered 2014-05-23 – 2014-05-26 (×6): 7.5 mg via ORAL
  Filled 2014-05-23 (×8): qty 1

## 2014-05-23 MED ORDER — TRAMADOL HCL 50 MG PO TABS
50.0000 mg | ORAL_TABLET | Freq: Four times a day (QID) | ORAL | Status: DC | PRN
  Administered 2014-05-23 – 2014-05-26 (×8): 50 mg via ORAL
  Filled 2014-05-23 (×8): qty 1

## 2014-05-23 MED ORDER — VANCOMYCIN HCL 1000 MG IV SOLR
INTRAVENOUS | Status: AC
  Filled 2014-05-23: qty 1000

## 2014-05-23 MED ORDER — BUSPIRONE HCL 15 MG PO TABS: 7.5000 mg | ORAL_TABLET | Freq: Two times a day (BID) | ORAL | Status: DC

## 2014-05-23 MED ORDER — FENTANYL CITRATE (PF) 100 MCG/2ML IJ SOLN
25.0000 ug | INTRAMUSCULAR | Status: DC | PRN
  Administered 2014-05-23 (×3): 50 ug via INTRAVENOUS

## 2014-05-23 MED ORDER — TEMAZEPAM 15 MG PO CAPS
30.0000 mg | ORAL_CAPSULE | Freq: Every day | ORAL | Status: DC
  Administered 2014-05-23 – 2014-05-25 (×3): 30 mg via ORAL
  Filled 2014-05-23 (×3): qty 2

## 2014-05-23 MED ORDER — IRON 325 (65 FE) MG PO TABS: 1.0000 | ORAL_TABLET | Freq: Every day | ORAL | Status: DC

## 2014-05-23 MED ORDER — FERROUS SULFATE 325 (65 FE) MG PO TABS
325.0000 mg | ORAL_TABLET | Freq: Every day | ORAL | Status: DC
  Administered 2014-05-24 – 2014-05-26 (×3): 325 mg via ORAL
  Filled 2014-05-23 (×3): qty 1

## 2014-05-23 MED ORDER — HYDROCODONE-ACETAMINOPHEN 10-325 MG PO TABS: 1.0000 | ORAL_TABLET | ORAL | Status: DC | PRN

## 2014-05-23 MED ORDER — LIDOCAINE HCL (CARDIAC) 20 MG/ML IV SOLN
INTRAVENOUS | Status: AC
  Filled 2014-05-23: qty 5

## 2014-05-23 MED ORDER — WARFARIN - PHARMACIST DOSING INPATIENT: Freq: Every day | Status: DC

## 2014-05-23 MED ORDER — METOCLOPRAMIDE HCL 5 MG/ML IJ SOLN
5.0000 mg | Freq: Three times a day (TID) | INTRAMUSCULAR | Status: DC | PRN
  Filled 2014-05-23: qty 2

## 2014-05-23 MED ORDER — DEXAMETHASONE SODIUM PHOSPHATE 4 MG/ML IJ SOLN
INTRAMUSCULAR | Status: AC
  Filled 2014-05-23: qty 2

## 2014-05-23 SURGICAL SUPPLY — 66 items
BANDAGE ELASTIC 4 VELCRO ST LF (GAUZE/BANDAGES/DRESSINGS) IMPLANT
BANDAGE ELASTIC 6 VELCRO ST LF (GAUZE/BANDAGES/DRESSINGS) IMPLANT
BANDAGE ESMARK 6X9 LF (GAUZE/BANDAGES/DRESSINGS) IMPLANT
BNDG CMPR 9X6 STRL LF SNTH (GAUZE/BANDAGES/DRESSINGS) ×1
BNDG CMPR MED 15X6 ELC VLCR LF (GAUZE/BANDAGES/DRESSINGS) ×1
BNDG COHESIVE 4X5 TAN STRL (GAUZE/BANDAGES/DRESSINGS) IMPLANT
BNDG ELASTIC 6X15 VLCR STRL LF (GAUZE/BANDAGES/DRESSINGS) ×2 IMPLANT
BNDG ESMARK 6X9 LF (GAUZE/BANDAGES/DRESSINGS) ×3
BNDG GAUZE ELAST 4 BULKY (GAUZE/BANDAGES/DRESSINGS) ×3 IMPLANT
CLOSURE STERI-STRIP 1/2X4 (GAUZE/BANDAGES/DRESSINGS) ×1
CLSR STERI-STRIP ANTIMIC 1/2X4 (GAUZE/BANDAGES/DRESSINGS) ×1 IMPLANT
COVER SURGICAL LIGHT HANDLE (MISCELLANEOUS) ×3 IMPLANT
CUFF TOURNIQUET SINGLE 34IN LL (TOURNIQUET CUFF) IMPLANT
CUFF TOURNIQUET SINGLE 44IN (TOURNIQUET CUFF) IMPLANT
DRAPE C-ARM 42X72 X-RAY (DRAPES) ×2 IMPLANT
DRAPE C-ARMOR (DRAPES) ×2 IMPLANT
DRAPE EXTREMITY T 121X128X90 (DRAPE) ×2 IMPLANT
DRAPE INCISE IOBAN 66X45 STRL (DRAPES) IMPLANT
DRAPE ORTHO SPLIT 77X108 STRL (DRAPES)
DRAPE PROXIMA HALF (DRAPES) ×2 IMPLANT
DRAPE SURG ORHT 6 SPLT 77X108 (DRAPES) IMPLANT
DRSG ADAPTIC 3X8 NADH LF (GAUZE/BANDAGES/DRESSINGS) ×2 IMPLANT
DRSG EMULSION OIL 3X3 NADH (GAUZE/BANDAGES/DRESSINGS) ×1 IMPLANT
DRSG PAD ABDOMINAL 8X10 ST (GAUZE/BANDAGES/DRESSINGS) ×1 IMPLANT
ELECT REM PT RETURN 9FT ADLT (ELECTROSURGICAL) ×3
ELECTRODE REM PT RTRN 9FT ADLT (ELECTROSURGICAL) ×1 IMPLANT
GAUZE SPONGE 4X4 12PLY STRL (GAUZE/BANDAGES/DRESSINGS) ×1 IMPLANT
GAUZE XEROFORM 1X8 LF (GAUZE/BANDAGES/DRESSINGS) ×1 IMPLANT
GLOVE BIOGEL PI IND STRL 8 (GLOVE) ×1 IMPLANT
GLOVE BIOGEL PI INDICATOR 8 (GLOVE) ×2
GLOVE SURG ORTHO 8.0 STRL STRW (GLOVE) ×5 IMPLANT
GOWN STRL REUS W/ TWL LRG LVL3 (GOWN DISPOSABLE) ×3 IMPLANT
GOWN STRL REUS W/TWL LRG LVL3 (GOWN DISPOSABLE) ×6
GUIDE PIN 3.2MM (MISCELLANEOUS) ×3
GUIDE PIN ORTH 343X3.2XBRAD (MISCELLANEOUS) IMPLANT
GUIDE ROD 3.0 (MISCELLANEOUS) ×3
KIT BASIN OR (CUSTOM PROCEDURE TRAY) ×3 IMPLANT
KIT ROOM TURNOVER OR (KITS) ×3 IMPLANT
KIT STIMULAN RAPID CURE  10CC (Orthopedic Implant) ×2 IMPLANT
KIT STIMULAN RAPID CURE 10CC (Orthopedic Implant) IMPLANT
MANIFOLD NEPTUNE II (INSTRUMENTS) ×3 IMPLANT
NS IRRIG 1000ML POUR BTL (IV SOLUTION) ×3 IMPLANT
PACK GENERAL/GYN (CUSTOM PROCEDURE TRAY) ×3 IMPLANT
PAD ABD 8X10 STRL (GAUZE/BANDAGES/DRESSINGS) ×8 IMPLANT
PAD ARMBOARD 7.5X6 YLW CONV (MISCELLANEOUS) ×6 IMPLANT
PAD CAST 4YDX4 CTTN HI CHSV (CAST SUPPLIES) ×2 IMPLANT
PADDING CAST COTTON 4X4 STRL (CAST SUPPLIES) ×3
PADDING CAST COTTON 6X4 STRL (CAST SUPPLIES) ×4 IMPLANT
ROD GUIDE 3.0 (MISCELLANEOUS) IMPLANT
SPONGE GAUZE 4X4 12PLY STER LF (GAUZE/BANDAGES/DRESSINGS) ×4 IMPLANT
STAPLER VISISTAT 35W (STAPLE) ×1 IMPLANT
STOCKINETTE IMPERVIOUS 9X36 MD (GAUZE/BANDAGES/DRESSINGS) ×2 IMPLANT
SUT ETHILON 2 0 FS 18 (SUTURE) ×2 IMPLANT
SUT ETHILON 3 0 PS 1 (SUTURE) ×8 IMPLANT
SUT ETHILON 4 0 FS 1 (SUTURE) ×2 IMPLANT
SUT MNCRL AB 3-0 PS2 18 (SUTURE) ×2 IMPLANT
SUT VIC AB 0 CT1 27 (SUTURE) ×6
SUT VIC AB 0 CT1 27XBRD ANBCTR (SUTURE) IMPLANT
SUT VIC AB 2-0 CT1 27 (SUTURE) ×6
SUT VIC AB 2-0 CT1 TAPERPNT 27 (SUTURE) IMPLANT
SUT VIC AB 3-0 FS2 27 (SUTURE) ×2 IMPLANT
SUT VIC AB 3-0 SH 27 (SUTURE) ×3
SUT VIC AB 3-0 SH 27X BRD (SUTURE) IMPLANT
TOWEL OR 17X24 6PK STRL BLUE (TOWEL DISPOSABLE) ×3 IMPLANT
TOWEL OR 17X26 10 PK STRL BLUE (TOWEL DISPOSABLE) ×3 IMPLANT
WATER STERILE IRR 1000ML POUR (IV SOLUTION) ×1 IMPLANT

## 2014-05-23 NOTE — Anesthesia Postprocedure Evaluation (Signed)
  Anesthesia Post-op Note  Patient: [name]  Procedure(s) Performed: Procedure(s) with comments: HARDWARE REMOVAL (Left) - REMOVAL OF HARDWARE LEFT TIBIA  Patient Location: PACU  Anesthesia Type:General  Level of Consciousness: awake  Airway and Oxygen Therapy: Patient Spontanous Breathing  Post-op Pain: mild  Post-op Assessment: Post-op Vital signs reviewed  Post-op Vital Signs: Reviewed  Last Vitals:  Filed Vitals:   05/23/14 1600  BP:   Pulse: 99  Temp:   Resp: 19    Complications: No apparent anesthesia complications

## 2014-05-23 NOTE — Addendum Note (Signed)
Addendum  created 05/23/14 1700 by Laretta Alstrom, CRNA   Modules edited: Anesthesia Attestations

## 2014-05-23 NOTE — Progress Notes (Signed)
ANTICOAGULATION CONSULT NOTE - Initial Consult  Pharmacy Consult:  Coumadin Indication:  History of AFib + VTE prophylaxis s/p hardware removal to left tibia  Allergies  Allergen Reactions  . Atenolol Nausea And Vomiting  . Clarithromycin Nausea And Vomiting  . Codeine Sulfate Nausea Only  . Macrodantin Other (See Comments)    Long time ago - reaction unknown  . Percocet [Oxycodone-Acetaminophen] Nausea And Vomiting  . Zosyn [Piperacillin Sod-Tazobactam So] Rash    Morbilliform eruption with itching    Patient Measurements: Height: 5\' 3"  (160 cm) Weight: 188 lb (85.276 kg) IBW/kg (Calculated) : 52.4  Vital Signs: Temp: 97.9 F (36.6 C) (05/03 1730) Temp Source: Oral (05/03 1011) BP: 130/50 mmHg (05/03 1721) Pulse Rate: 74 (05/03 1721)  Labs:  Recent Labs  05/23/14 1016 05/23/14 1017  APTT 32  --   LABPROT  --  15.4*  INR  --  1.21    Estimated Creatinine Clearance: 48.1 mL/min (by C-G formula based on Cr of 0.87).   Medical History: Past Medical History  Diagnosis Date  . Hypertension   . LBP (low back pain)     severe lumbar spondylosis s/p L3/L4,L4/L5 fusion 12/10  . Osteopenia   . Hypothyroidism   . Chronic constipation   . History of colitis     DX  2003  WITH ISCHEMIC COLITIS  . History of GI bleed     2011--- UPPER SECONARY GASTRIC ULCER FROM NSAID USE  . History of Helicobacter pylori infection     2011  . GERD (gastroesophageal reflux disease)   . H/O hiatal hernia   . Diverticulosis of colon   . History of ulcer of lower limb     2012   RIGHT LOWER EXTREMITIY--  STATSIS ULCER  . Bilateral edema of lower extremity   . Persistent atrial fibrillation     CARDIOLOGIST-   DR Burman Foster  . Lumbar spondylolysis   . Anticoagulated on Coumadin   . Hyperlipidemia   . Open wound of left lower leg   . Iron deficiency anemia 2011    elevated MCV  . OA (osteoarthritis)     "hands" (08/25/2013)  . History of diverticulitis of colon     perferated  diverticulitis w/ abscess 08-25-2013 drain placed --  resolved without surgical intervention  . History of cellulitis     LEFT LOWER LEG --  SEPT 2015  . RBBB     at fib  . Anxiety     denies     Assessment: 14 YOF with history of AFib to resume Coumadin s/p removal of hardware to left tibia, placement of antibiotic beads, and fistula debridement.  Patient's INR is sub-therapeutic at 1.21.  No bleeding documented post-op.   Goal of Therapy:  INR 2-3   Plan:  - Coumadin 5mg  PO today - Daily PT / INR    Hareem Surowiec D. Mina Marble, PharmD, BCPS Pager:  [phone] 05/23/2014, 6:10 PM

## 2014-05-23 NOTE — Anesthesia Preprocedure Evaluation (Addendum)
Anesthesia Evaluation  Patient identified by MRN, date of birth, ID band Patient awake    Airway Mallampati: II  TM Distance: >3 FB Neck ROM: Full    Dental   Pulmonary  breath sounds clear to auscultation        Cardiovascular hypertension, + Peripheral Vascular Disease Rhythm:Regular Rate:Normal     Neuro/Psych    GI/Hepatic hiatal hernia, PUD, GERD-  ,  Endo/Other  Hypothyroidism   Renal/GU Renal disease     Musculoskeletal   Abdominal   Peds  Hematology   Anesthesia Other Findings   Reproductive/Obstetrics                            Anesthesia Physical Anesthesia Plan  ASA: III  Anesthesia Plan: General   Post-op Pain Management:    Induction: Intravenous  Airway Management Planned: LMA  Additional Equipment:   Intra-op Plan:   Post-operative Plan: Extubation in OR  Informed Consent: I have reviewed the patients History and Physical, chart, labs and discussed the procedure including the risks, benefits and alternatives for the proposed anesthesia with the patient or authorized representative who has indicated his/her understanding and acceptance.   Dental advisory given  Plan Discussed with: CRNA and Anesthesiologist  Anesthesia Plan Comments:         Anesthesia Quick Evaluation

## 2014-05-23 NOTE — H&P (Signed)
[name] is an 81 y.o. female.   Chief Complaint: Left leg pain HPI: [name] is an 81 year old female who had had an open left tib-fib fracture approximately 9 months ago. She received Hexabrix followed by intramedullary nail. She is not a candidate for flap. She has gone to at least partial union of her distal tib-fib fracture. She has had a chronic small draining sinus being treated by plastic surgery. She has been on antibiotics for approximately last 4 months. She presents now for hardware removal following CT evidence of at least partial union of the distal tibial fracture. She has been weightbearing on the leg for several months. She denies any fever or chills.  Past Medical History  Diagnosis Date  . Hypertension   . LBP (low back pain)     severe lumbar spondylosis s/p L3/L4,L4/L5 fusion 12/10  . Osteopenia   . Hypothyroidism   . Chronic constipation   . History of colitis     DX  2003  WITH ISCHEMIC COLITIS  . History of GI bleed     2011--- UPPER SECONARY GASTRIC ULCER FROM NSAID USE  . History of Helicobacter pylori infection     2011  . GERD (gastroesophageal reflux disease)   . H/O hiatal hernia   . Diverticulosis of colon   . History of ulcer of lower limb     2012   RIGHT LOWER EXTREMITIY--  STATSIS ULCER  . Bilateral edema of lower extremity   . Persistent atrial fibrillation     CARDIOLOGIST-   DR Burman Foster  . Lumbar spondylolysis   . Anticoagulated on Coumadin   . Hyperlipidemia   . Open wound of left lower leg   . Iron deficiency anemia 2011    elevated MCV  . OA (osteoarthritis)     "hands" (08/25/2013)  . History of diverticulitis of colon     perferated diverticulitis w/ abscess 08-25-2013 drain placed --  resolved without surgical intervention  . History of cellulitis     LEFT LOWER LEG --  SEPT 2015  . RBBB     at fib  . Anxiety     denies    Past Surgical History  Procedure Laterality Date  . Shoulder open rotator cuff repair Left 1997  . Total  hip arthroplasty Right 06-07-2010  . External fixation leg Left 06/16/2013    Procedure: EXTERNAL FIXATION LEG;  Surgeon: Meredith Pel, MD;  Location: [place];  Service: Orthopedics;  Laterality: Left;  . I&d extremity Left 06/16/2013    Procedure: IRRIGATION AND DEBRIDEMENT EXTREMITY;  Surgeon: Meredith Pel, MD;  Location: [place];  Service: Orthopedics;  Laterality: Left;  . Application of wound vac Left 06/16/2013    Procedure: APPLICATION OF WOUND VAC;  Surgeon: Meredith Pel, MD;  Location: [place];  Service: Orthopedics;  Laterality: Left;  . Tibia im nail insertion Left 06/21/2013    Procedure: INTRAMEDULLARY (IM) NAIL TIBIAL;  Surgeon: Meredith Pel, MD;  Location: [place];  Service: Orthopedics;  Laterality: Left;  . External fixation removal Left 06/21/2013    Procedure: REMOVAL EXTERNAL FIXATION LEG;  Surgeon: Meredith Pel, MD;  Location: [place];  Service: Orthopedics;  Laterality: Left;  . Application of a-cell of extremity Left 06/21/2013    Procedure: APPLICATION OF A-CELL OF EXTREMITY;  Surgeon: Theodoro Kos, DO;  Location: [place];  Service: Plastics;  Laterality: Left;  . Application of wound vac Left 06/21/2013    Procedure: APPLICATION OF WOUND VAC;  Surgeon: Theodoro Kos, DO;  Location: [place];  Service: Plastics;  Laterality: Left;  . Lumbar fusion  01-02-2009    L3-L5  . Transthoracic echocardiogram  09-11-2010   DR MCALHANY    LVSF  55-60%/  MILD MV CALCIFICATION WITH NO STENOSIS/  MILD MR & TR / MODERATE LAE/  MODERATE TO SEVERE RAE  . Tonsillectomy  AS CHILD  . Application of a-cell of extremity Left 08/22/2013    Procedure: APPLICATION OF A-CELL/VAC TO LOWER LEFT LEG WOUND;  Surgeon: Theodoro Kos, DO;  Location: [place];  Service: Plastics;  Laterality: Left;  . Upper gi endoscopy  03-29-2010  . I&d extremity Left 02/06/2014    Procedure: IRRIGATION AND DEBRIDEMENT LEFT LEG WOUND ;  Surgeon: Theodoro Kos, DO;  Location: [place];  Service: Plastics;  Laterality: Left;  . Application of a-cell of extremity Left 02/06/2014    Procedure: WITH PLACEMENT OF A -CELL ;  Surgeon: Theodoro Kos, DO;  Location: [place];  Service: Plastics;  Laterality: Left;    Family History  Problem Relation Age of Onset  . Cancer Father   . Hypertension Other    Social History:  reports that she has never smoked. She has never used smokeless tobacco. She reports that she drinks alcohol. She reports that she does not use illicit drugs.  Allergies:  Allergies  Allergen Reactions  . Atenolol Nausea And Vomiting  . Clarithromycin Nausea And Vomiting  . Codeine Sulfate Nausea Only  . Macrodantin Other (See Comments)    Long time ago - reaction unknown  . Percocet [Oxycodone-Acetaminophen] Nausea And Vomiting  . Zosyn [Piperacillin Sod-Tazobactam So] Rash    Morbilliform eruption with itching    No prescriptions prior to admission    No results found for this or any previous visit (from the past 48 hour(s)). No results found.  Review of Systems  Constitutional: Negative.   HENT: Negative.   Eyes: Negative.   Respiratory: Negative.   Cardiovascular: Negative.   Gastrointestinal: Negative.   Genitourinary: Negative.   Musculoskeletal: Positive for joint pain.  Skin: Negative.   Neurological: Negative.   Endo/Heme/Allergies: Negative.   Psychiatric/Behavioral: Negative.     There were no vitals taken for this visit. Physical Exam  Constitutional: She appears well-developed.  HENT:  Head: Normocephalic.  Eyes: Pupils are equal, round, and reactive to light.  Neck: Normal range of motion.  Cardiovascular: Normal rate.   Respiratory: Effort normal.  Neurological: She is alert.  Skin: Skin is warm.  Psychiatric: She has a normal mood and affect.   examination of the  left leg demonstrates perfused foot small draining sinus on the medial aspect of the distal tib-fib region she has good ankle  dorsi flexion plantar flexion strength no redness or erythema around the distal tib-fib region knee range of motion is intact hardware is not palpable nor prominent  Assessment/Plan Impression is right open distal tib-fib fracture status post multiple procedures 9 months ago. CT scan shows evidence of healing of the tibial fracture. Has been a small draining sinus since the majority of the healing has occurred. His been managed by plastic surgery. Patient is not a flap candidate. At this time in order to facilitate resolution of this injury the plan is for removal of the hardware placement of this potentially some bone graft and antibiotic beads and protected weightbearing for fracture boot. Risk and benefits of the procedure discussed with the patient including but limited to  infection nerve vessel damage potential for refracture which would require more surgery as well as potential for amputation in the long run if she does have complications related hardware removal. I don't think it will be feasible at this point in time for soft tissue healing to occur without removal of hardware. CT scans to demonstrate some healing of the fracture and no evidence of loosening distally around the hardware distal interlocking screws. Patient extends risk and benefits all questions answered  Rose Hegner SCOTT 05/23/2014, 7:29 AM

## 2014-05-23 NOTE — Transfer of Care (Signed)
Immediate Anesthesia Transfer of Care Note  Patient: [name]  Procedure(s) Performed: Procedure(s) with comments: HARDWARE REMOVAL (Left) - REMOVAL OF HARDWARE LEFT TIBIA  Patient Location: PACU  Anesthesia Type:General  Level of Consciousness: awake, alert , oriented and patient cooperative  Airway & Oxygen Therapy: Patient Spontanous Breathing and Patient connected to nasal cannula oxygen  Post-op Assessment: Report given to RN and Post -op Vital signs reviewed and stable  Post vital signs: Reviewed and stable  Last Vitals:  Filed Vitals:   05/23/14 1548  BP:   Pulse:   Temp: 36.2 C  Resp:     Complications: No apparent anesthesia complications

## 2014-05-23 NOTE — Brief Op Note (Signed)
05/23/2014  3:41 PM  PATIENT:  [name]  81 y.o. female  PRE-OPERATIVE DIAGNOSIS:  LEFT TIBIAL INFECTION  POST-OPERATIVE DIAGNOSIS:  LEFT TIBIAL INFECTION  PROCEDURE:  Procedure(s): HARDWARE REMOVAL x 5 -  Placement abx beads - fistula debridement  SURGEON:  Surgeon(s): Meredith Pel, MD  ASSISTANT: carla bethune rnfa  ANESTHESIA:   general  EBL: 100 ml    Total I/O In: 1000 [I.V.:1000] Out: 100 [Blood:100]  BLOOD ADMINISTERED: none  DRAINS: none   LOCAL MEDICATIONS USED:  none  SPECIMEN:  No Specimen  COUNTS:  YES  TOURNIQUET:  * No tourniquets in log *  DICTATION: .Other Dictation: Dictation Number [id number]  PLAN OF CARE: Admit for overnight observation  PATIENT DISPOSITION:  PACU - hemodynamically stable

## 2014-05-24 ENCOUNTER — Encounter (HOSPITAL_COMMUNITY): Payer: Self-pay | Admitting: Orthopedic Surgery

## 2014-05-24 DIAGNOSIS — Z7901 Long term (current) use of anticoagulants: Secondary | ICD-10-CM | POA: Diagnosis not present

## 2014-05-24 DIAGNOSIS — M6281 Muscle weakness (generalized): Secondary | ICD-10-CM | POA: Diagnosis not present

## 2014-05-24 DIAGNOSIS — M898X6 Other specified disorders of bone, lower leg: Secondary | ICD-10-CM | POA: Diagnosis present

## 2014-05-24 DIAGNOSIS — K219 Gastro-esophageal reflux disease without esophagitis: Secondary | ICD-10-CM | POA: Diagnosis present

## 2014-05-24 DIAGNOSIS — K5730 Diverticulosis of large intestine without perforation or abscess without bleeding: Secondary | ICD-10-CM | POA: Diagnosis present

## 2014-05-24 DIAGNOSIS — Z881 Allergy status to other antibiotic agents status: Secondary | ICD-10-CM | POA: Diagnosis not present

## 2014-05-24 DIAGNOSIS — Z96641 Presence of right artificial hip joint: Secondary | ICD-10-CM | POA: Diagnosis present

## 2014-05-24 DIAGNOSIS — M19042 Primary osteoarthritis, left hand: Secondary | ICD-10-CM | POA: Diagnosis present

## 2014-05-24 DIAGNOSIS — Z472 Encounter for removal of internal fixation device: Secondary | ICD-10-CM | POA: Diagnosis not present

## 2014-05-24 DIAGNOSIS — B999 Unspecified infectious disease: Secondary | ICD-10-CM | POA: Diagnosis not present

## 2014-05-24 DIAGNOSIS — I480 Paroxysmal atrial fibrillation: Secondary | ICD-10-CM | POA: Diagnosis present

## 2014-05-24 DIAGNOSIS — Z885 Allergy status to narcotic agent status: Secondary | ICD-10-CM | POA: Diagnosis not present

## 2014-05-24 DIAGNOSIS — M8580 Other specified disorders of bone density and structure, unspecified site: Secondary | ICD-10-CM | POA: Diagnosis present

## 2014-05-24 DIAGNOSIS — I4510 Unspecified right bundle-branch block: Secondary | ICD-10-CM | POA: Diagnosis present

## 2014-05-24 DIAGNOSIS — Z9181 History of falling: Secondary | ICD-10-CM | POA: Diagnosis not present

## 2014-05-24 DIAGNOSIS — S82202A Unspecified fracture of shaft of left tibia, initial encounter for closed fracture: Secondary | ICD-10-CM | POA: Diagnosis not present

## 2014-05-24 DIAGNOSIS — M19041 Primary osteoarthritis, right hand: Secondary | ICD-10-CM | POA: Diagnosis present

## 2014-05-24 DIAGNOSIS — G8929 Other chronic pain: Secondary | ICD-10-CM | POA: Diagnosis not present

## 2014-05-24 DIAGNOSIS — Y838 Other surgical procedures as the cause of abnormal reaction of the patient, or of later complication, without mention of misadventure at the time of the procedure: Secondary | ICD-10-CM | POA: Diagnosis present

## 2014-05-24 DIAGNOSIS — F5101 Primary insomnia: Secondary | ICD-10-CM | POA: Diagnosis not present

## 2014-05-24 DIAGNOSIS — E039 Hypothyroidism, unspecified: Secondary | ICD-10-CM | POA: Diagnosis present

## 2014-05-24 DIAGNOSIS — D509 Iron deficiency anemia, unspecified: Secondary | ICD-10-CM | POA: Diagnosis present

## 2014-05-24 DIAGNOSIS — Z888 Allergy status to other drugs, medicaments and biological substances status: Secondary | ICD-10-CM | POA: Diagnosis not present

## 2014-05-24 DIAGNOSIS — Z79899 Other long term (current) drug therapy: Secondary | ICD-10-CM | POA: Diagnosis not present

## 2014-05-24 DIAGNOSIS — T84623A Infection and inflammatory reaction due to internal fixation device of left tibia, initial encounter: Secondary | ICD-10-CM | POA: Diagnosis present

## 2014-05-24 DIAGNOSIS — I10 Essential (primary) hypertension: Secondary | ICD-10-CM | POA: Diagnosis present

## 2014-05-24 DIAGNOSIS — I4891 Unspecified atrial fibrillation: Secondary | ICD-10-CM | POA: Diagnosis not present

## 2014-05-24 DIAGNOSIS — F419 Anxiety disorder, unspecified: Secondary | ICD-10-CM | POA: Diagnosis present

## 2014-05-24 DIAGNOSIS — Z981 Arthrodesis status: Secondary | ICD-10-CM | POA: Diagnosis not present

## 2014-05-24 DIAGNOSIS — E785 Hyperlipidemia, unspecified: Secondary | ICD-10-CM | POA: Diagnosis present

## 2014-05-24 DIAGNOSIS — K5900 Constipation, unspecified: Secondary | ICD-10-CM | POA: Diagnosis present

## 2014-05-24 DIAGNOSIS — Z880 Allergy status to penicillin: Secondary | ICD-10-CM | POA: Diagnosis not present

## 2014-05-24 DIAGNOSIS — E569 Vitamin deficiency, unspecified: Secondary | ICD-10-CM | POA: Diagnosis not present

## 2014-05-24 DIAGNOSIS — R269 Unspecified abnormalities of gait and mobility: Secondary | ICD-10-CM | POA: Diagnosis not present

## 2014-05-24 LAB — PROTIME-INR
INR: 1.27 (ref 0.00–1.49)
Prothrombin Time: 16 seconds — ABNORMAL HIGH (ref 11.6–15.2)

## 2014-05-24 MED ORDER — WARFARIN SODIUM 5 MG PO TABS
5.0000 mg | ORAL_TABLET | Freq: Once | ORAL | Status: AC
  Administered 2014-05-24: 5 mg via ORAL
  Filled 2014-05-24: qty 1

## 2014-05-24 MED ORDER — SENNA 8.6 MG PO TABS
1.0000 | ORAL_TABLET | Freq: Two times a day (BID) | ORAL | Status: DC
  Administered 2014-05-24 – 2014-05-26 (×4): 8.6 mg via ORAL
  Filled 2014-05-24 (×4): qty 1

## 2014-05-24 MED ORDER — CEFAZOLIN SODIUM-DEXTROSE 2-3 GM-% IV SOLR
2.0000 g | Freq: Four times a day (QID) | INTRAVENOUS | Status: AC
  Administered 2014-05-24 – 2014-05-25 (×4): 2 g via INTRAVENOUS
  Filled 2014-05-24 (×4): qty 50

## 2014-05-24 NOTE — Op Note (Signed)
NAME:  [name], [name] NO.:  [id number]  MEDICAL RECORD NO.:  [id number]  LOCATION:  [id number]                        FACILITY:  [facility]  PHYSICIAN:  Anderson Malta, M.D.    DATE OF BIRTH:  [birth year]  DATE OF PROCEDURE: DATE OF DISCHARGE:                              OPERATIVE REPORT   PREOPERATIVE DIAGNOSIS:  Retained hardware, left tibia with fistula, possible infection.  POSTOPERATIVE DIAGNOSIS:  Retained hardware, left tibia with fistula, possible infection.  PROCEDURE:  Removal of hardware, left tibia x5, 4 interlocks, 1 tibial nail, intramedullary reaming of the healed tibia with placement of antibiotic beads within the intramedullary canal, debridement of 1 small distal medial tibial fistula.  SURGEON:  Anderson Malta, M.D.  ASSISTANT:  Laure Kidney.  ANESTHESIA:  General.  ESTIMATED BLOOD LOSS:  100 mL.  INDICATIONS:  [name] is an 81 year old female with severe open left tib-fib fracture about 9 months ago, has gone on to heal, the tibia fracture has had persistent small amount of pin-hole drainage from the distal medial tibia.  She presents now for removal of hardware and placement of antibiotic beads.  PROCEDURE IN DETAIL:  The patient was brought to the operating room where general endotracheal anesthesia was introduced.  Preoperative antibiotics were administered.  Time-out was called.  Left leg was prescrubbed with alcohol and Betadine, allowed to air dry, prepped with DuraPrep solution and draped in a sterile manner.  Charlie Pitter was used to cover the operative field.  Time-out was called.  Deep hardware was removed from the two distal interlocking and two proximal interlocking incisions.  This was done with the assistance of fluoroscopic guidance. This was done through four separate incisions.  At this time, approach to the patellar tendon was made.  Skin and subcutaneous tissue were sharply divided.  Patellar tendon was divided.  The medial  border nail was identified and then removed.  Following removal of deep hardware x5, thorough irrigation was performed.  The canal was reamed to 12.5 mm, 11.5 nail was placed.  At this time, Stimulan beads with vancomycin and gentamicin were placed into the distal half of the tibia.  Following this, the leg was examined under fluoroscopy, found to have good stable alignment and decent fracture fixation.  Thorough irrigation was then performed of the knee as well as the four interlocking sites.  These incisions were then closed using combination of interrupted inverted 2-0 and 3-0 Vicryl and nylon for the interlocking incisions and 0 Vicryl, 2- 0 Vicryl, and 3-0 Monocryl for the knee incision.  Following this, the distal medial aspect of the tibia was excised, extending about 1 cm in each direction.  Skin and subcutaneous tissue were sharply divided. Curettage was performed.  Thorough irrigation was then performed.  The fistula portion of this was removed back to healthy skin edges.  These were then loosely reapproximated using far-near-near-far 2-0 nylon sutures.  At this time, bulky dressing was applied.  The patient tolerated the procedure well without immediate complication, transferred to the recovery room in stable condition.     Anderson Malta, M.D.     GSD/MEDQ  D:  05/23/2014  T:  05/24/2014  Job:  [id number]

## 2014-05-24 NOTE — Progress Notes (Signed)
Pt stable - dressing reinforced Motor sensory ok in foot Plan to stay today - dressing change tomorrow - dc tomorrow - continue iv abx

## 2014-05-24 NOTE — Progress Notes (Signed)
UR completed 

## 2014-05-24 NOTE — Evaluation (Addendum)
Physical Therapy Evaluation Patient Details Name: [name] MRN: [id number] DOB: 09-22-1925 Today's Date: 05/24/2014   History of Present Illness  81 year old female s/p left tibia removal of hardware and antibiotic bead placement for Left tibial infection. She fell and sustained an open distal tib-fib fracture in 05/2013. PMH includes non-smoker, paroxysmal afib/flutter (chronic for the past three years), iron deficiency anemia, GI bleed, HTN, hypothyroidism, anxiety, acute renal failure '11 in the setting of dehydration, right THR '12, pelvic abscess felt likely rupture diverticulitis s/p abx and drain by IR s/p removal 08/2013, lumbar fusion '10  Clinical Impression  Patient demonstrates deficits in functional mobility as indicated below. Will need continued skilled PT to address deficits and maximize function. Will see as indicated and progress as tolerated.    Follow Up Recommendations SNF;Supervision/Assistance - 24 hour    Equipment Recommendations  None recommended by PT    Recommendations for Other Services       Precautions / Restrictions Precautions Precautions: Fall Restrictions Weight Bearing Restrictions: Yes LLE Weight Bearing: Weight bearing as tolerated Other Position/Activity Restrictions: with boot      Mobility  Bed Mobility Overal bed mobility: Needs Assistance Bed Mobility: Supine to Sit;Sit to Supine     Supine to sit: Min assist Sit to supine: Mod assist   General bed mobility comments: significantly increased time to perform, heavy reliance on rails  Transfers Overall transfer level: Needs assistance Equipment used: Rolling walker (2 wheeled) (face to face with gait belt and chuck pad) Transfers: Sit to/from Stand;Stand Pivot Transfers Sit to Stand: Max assist;+2 physical assistance Stand pivot transfers: Max assist;+2 physical assistance       General transfer comment: COULD NOT COMPLETE TRANSFERS DESPITE SEVERAL  ATTEMPTS  Ambulation/Gait                Stairs            Wheelchair Mobility    Modified Rankin (Stroke Patients Only)       Balance Overall balance assessment: Needs assistance;History of Falls Sitting-balance support: Feet supported Sitting balance-Leahy Scale: Fair                                       Pertinent Vitals/Pain Pain Assessment: 0-10 Pain Score: 6  (with movement) Pain Location: left leg    Home Living Family/patient expects to be discharged to:: Assisted living ([facility]) Living Arrangements: Spouse/significant other Available Help at Discharge: Family;Available 24 hours/day Type of Home: Apartment Home Access: Ramped entrance       Home Equipment: Walker - 2 wheels;Bedside commode;Cane - single point;Shower seat - built in Additional Comments: [facility]    Prior Function Level of Independence: Needs assistance   Gait / Transfers Assistance Needed: +1 assist per pt for transfers with RW  ADL's / Homemaking Assistance Needed: Able to do some ADL activities without assist        Hand Dominance   Dominant Hand: Right    Extremity/Trunk Assessment   Upper Extremity Assessment: Generalized weakness           Lower Extremity Assessment: Generalized weakness;LLE deficits/detail      Cervical / Trunk Assessment:  (increased body habitus)  Communication   Communication: HOH  Cognition Arousal/Alertness: Awake/alert Behavior During Therapy: Anxious Overall Cognitive Status: Within Functional Limits for tasks assessed  General Comments      Exercises        Assessment/Plan    PT Assessment Patient needs continued PT services  PT Diagnosis Difficulty walking;Acute pain   PT Problem List Decreased strength;Decreased range of motion;Decreased activity tolerance;Decreased balance;Decreased mobility;Obesity;Pain  PT Treatment Interventions DME instruction;Gait  training;Functional mobility training;Therapeutic activities;Therapeutic exercise;Balance training;Patient/family education   PT Goals (Current goals can be found in the Care Plan section) Acute Rehab PT Goals Patient Stated Goal: to go home PT Goal Formulation: With patient/family Time For Goal Achievement: 06/07/14 Potential to Achieve Goals: Fair    Frequency Min 2X/week   Barriers to discharge        Co-evaluation               End of Session Equipment Utilized During Treatment: Gait belt Activity Tolerance: Patient limited by pain Patient left: in bed;with call bell/phone within reach;with bed alarm set;with family/visitor present Nurse Communication: Mobility status;Precautions;Weight bearing status         Time: 9935-7017 PT Time Calculation (min) (ACUTE ONLY): 36 min   Charges:   PT Evaluation $Initial PT Evaluation Tier I: 1 Procedure PT Treatments $Therapeutic Activity: 8-22 mins   PT G CodesDuncan Dull Jun 17, 2014, [place], [place] DPT  [phone]

## 2014-05-24 NOTE — Progress Notes (Signed)
ANTICOAGULATION CONSULT NOTE - Follow Up Consult  Pharmacy Consult for coumadin Indication: History of AFib + VTE prophylaxis s/p hardware removal to left tibia  Allergies  Allergen Reactions  . Atenolol Nausea And Vomiting  . Clarithromycin Nausea And Vomiting  . Codeine Sulfate Nausea Only  . Macrodantin Other (See Comments)    Long time ago - reaction unknown  . Percocet [Oxycodone-Acetaminophen] Nausea And Vomiting  . Zosyn [Piperacillin Sod-Tazobactam So] Rash    Morbilliform eruption with itching    Patient Measurements: Height: 5\' 3"  (160 cm) Weight: 188 lb (85.276 kg) IBW/kg (Calculated) : 52.4   Vital Signs: Temp: 98.6 F (37 C) (05/04 0923) Temp Source: Oral (05/04 0923) BP: 130/70 mmHg (05/04 0923) Pulse Rate: 98 (05/04 0923)  Labs:  Recent Labs  05/23/14 1016 05/23/14 1017 05/24/14 0728  APTT 32  --   --   LABPROT  --  15.4* 16.0*  INR  --  1.21 1.27    Estimated Creatinine Clearance: 48.1 mL/min (by C-G formula based on Cr of 0.87).   Medications:  Scheduled:  . atorvastatin  10 mg Oral QPM  . B-complex with vitamin C  1 tablet Oral Daily  . busPIRone  7.5 mg Oral BID  .  ceFAZolin (ANCEF) IV  2 g Intravenous Q6H  . cholecalciferol  1,000 Units Oral Daily  . diltiazem  120 mg Oral Daily  . ferrous sulfate  325 mg Oral Q breakfast  . furosemide  40 mg Oral Q M,W,F  . levothyroxine  25 mcg Oral QAC breakfast  . metoprolol succinate  50 mg Oral Daily  . potassium chloride  10 mEq Oral Daily  . temazepam  30 mg Oral QHS  . Warfarin - Pharmacist Dosing Inpatient   Does not apply q1800    Assessment: 7 YOF with history of AFib on Coumadin s/p removal of hardware to left tibia, placement of antibiotic beads, and fistula debridement.INR today is 1.27.   Home coumadin dose: 2.5mg  daily except 5mg  on Mon and Fri  Goal of Therapy:  INR 2-3 Monitor platelets by anticoagulation protocol: Yes   Plan:   -Coumadin 5mg  po today -Daily  PT/INR  Hildred Laser, Pharm D 05/24/2014 10:26 AM

## 2014-05-25 LAB — PROTIME-INR
INR: 1.73 — ABNORMAL HIGH (ref 0.00–1.49)
Prothrombin Time: 20.4 seconds — ABNORMAL HIGH (ref 11.6–15.2)

## 2014-05-25 MED ORDER — CEFAZOLIN SODIUM-DEXTROSE 2-3 GM-% IV SOLR
2.0000 g | Freq: Four times a day (QID) | INTRAVENOUS | Status: AC
  Administered 2014-05-25 (×2): 2 g via INTRAVENOUS
  Filled 2014-05-25 (×3): qty 50

## 2014-05-25 MED ORDER — WARFARIN SODIUM 5 MG PO TABS
5.0000 mg | ORAL_TABLET | Freq: Once | ORAL | Status: AC
  Administered 2014-05-25: 5 mg via ORAL
  Filled 2014-05-25: qty 1

## 2014-05-25 NOTE — Progress Notes (Signed)
Physical Therapy Treatment Patient Details Name: [name] MRN: [id number] DOB: 14-Feb-1925 Today's Date: 05/25/2014    History of Present Illness 81 year old female s/p left tibia removal of hardware and antibiotic bead placement for Left tibial infection. She fell and sustained an open distal tib-fib fracture in 05/2013. PMH includes non-smoker, paroxysmal afib/flutter (chronic for the past three years), iron deficiency anemia, GI bleed, HTN, hypothyroidism, anxiety, acute renal failure '11 in the setting of dehydration, right THR '12, pelvic abscess felt likely rupture diverticulitis s/p abx and drain by IR s/p removal 08/2013, lumbar fusion '10    PT Comments    Patient ability to tolerate mobility is modestly improved this session, however, patient still requires increased +2 maximal assist for transfers. Patient demonstrates some WBing through LE this session. Will continue to see and progress as tolerated. Continue to recommend SNF upon acute discharge.  Follow Up Recommendations  SNF;Supervision/Assistance - 24 hour     Equipment Recommendations  None recommended by PT    Recommendations for Other Services       Precautions / Restrictions Precautions Precautions: Fall Restrictions Weight Bearing Restrictions: Yes LLE Weight Bearing: Weight bearing as tolerated Other Position/Activity Restrictions: with boot    Mobility  Bed Mobility Overal bed mobility: Needs Assistance Bed Mobility: Rolling;Sit to Supine Rolling: Mod assist     Sit to supine: Min assist   General bed mobility comments: VCs for positioing, assist for LE placement and body rotation in bed  Transfers Overall transfer level: Needs assistance Equipment used:  (face to face with gait belt and chuck pad) Transfers: Sit to/from Stand;Stand Pivot Transfers Sit to Stand: Max assist;+2 physical assistance Stand pivot transfers: Max assist;+2 physical assistance       General transfer comment: Able to  tolerate transfer with some WBing through LLE this session, still requires significant 2 person assist.  Ambulation/Gait                 Stairs            Wheelchair Mobility    Modified Rankin (Stroke Patients Only)       Balance     Sitting balance-Leahy Scale: Fair                              Cognition Arousal/Alertness: Awake/alert Behavior During Therapy: Anxious Overall Cognitive Status: Within Functional Limits for tasks assessed                      Exercises      General Comments        Pertinent Vitals/Pain Pain Assessment: 0-10 Pain Score: 4  Pain Location: left leg Pain Descriptors / Indicators: Grimacing;Guarding Pain Intervention(s): Limited activity within patient's tolerance;Repositioned    Home Living                      Prior Function            PT Goals (current goals can now be found in the care plan section) Acute Rehab PT Goals Patient Stated Goal: to go home PT Goal Formulation: With patient/family Time For Goal Achievement: 06/07/14 Potential to Achieve Goals: Fair Progress towards PT goals: Progressing toward goals    Frequency  Min 2X/week    PT Plan Current plan remains appropriate    Co-evaluation             End of  Session Equipment Utilized During Treatment: Gait belt Activity Tolerance: Patient limited by pain Patient left: in bed;with call bell/phone within reach;with bed alarm set;with family/visitor present     Time: 5929-2446 PT Time Calculation (min) (ACUTE ONLY): 16 min  Charges:  $Therapeutic Activity: 8-22 mins                    G CodesDuncan Dull 2014-05-29, 12:17 PM Alben Deeds, [name] DPT  [phone]

## 2014-05-25 NOTE — Progress Notes (Signed)
Pt stable Dressing changed - incisions ok Slow mobilization May need snf vs home with hhc - ok to wbat lle in fx boot Will try for dispo am home vs snf Will go home on 7 days abx

## 2014-05-25 NOTE — Clinical Social Work Note (Signed)
Patient has a bed at [facility] once medically stable for discharge.     FL-2 on chart for MD signature.   Glendon Axe, MSW, LCSWA [phone] 05/25/2014 11:43 AM

## 2014-05-25 NOTE — Progress Notes (Signed)
[place] for coumadin Indication: History of AFib + VTE prophylaxis s/p hardware removal to left tibia  Allergies  Allergen Reactions  . Atenolol Nausea And Vomiting  . Clarithromycin Nausea And Vomiting  . Codeine Sulfate Nausea Only  . Macrodantin Other (See Comments)    Long time ago - reaction unknown  . Percocet [Oxycodone-Acetaminophen] Nausea And Vomiting  . Zosyn [Piperacillin Sod-Tazobactam So] Rash    Morbilliform eruption with itching    Patient Measurements: Height: 5\' 3"  (160 cm) Weight: 188 lb (85.276 kg) IBW/kg (Calculated) : 52.4   Vital Signs: Temp: 99 F (37.2 C) (05/05 0632) Temp Source: Oral (05/05 8413) BP: 141/77 mmHg (05/05 0632) Pulse Rate: 112 (05/05 0632)  Labs:  Recent Labs  05/23/14 1016 05/23/14 1017 05/24/14 0728  APTT 32  --   --   LABPROT  --  15.4* 16.0*  INR  --  1.21 1.27    Estimated Creatinine Clearance: 48.1 mL/min (by C-G formula based on Cr of 0.87).   Medications:  Scheduled:  . atorvastatin  10 mg Oral QPM  . B-complex with vitamin C  1 tablet Oral Daily  . busPIRone  7.5 mg Oral BID  .  ceFAZolin (ANCEF) IV  2 g Intravenous Q6H  . cholecalciferol  1,000 Units Oral Daily  . diltiazem  120 mg Oral Daily  . ferrous sulfate  325 mg Oral Q breakfast  . furosemide  40 mg Oral Q M,W,F  . levothyroxine  25 mcg Oral QAC breakfast  . metoprolol succinate  50 mg Oral Daily  . potassium chloride  10 mEq Oral Daily  . senna  1 tablet Oral BID  . temazepam  30 mg Oral QHS  . Warfarin - Pharmacist Dosing Inpatient   Does not apply q1800    Assessment: 84 YOF with history of AFib on Coumadin s/p removal of hardware to left tibia, placement of antibiotic beads, and fistula debridement.INR 1.27, morning labs pending.  Home coumadin dose: 2.5mg  daily except 5 mg on Mon and Fri  Goal of Therapy:  INR 2-3 Monitor platelets by anticoagulation protocol: Yes   Plan:   -Coumadin 5 mg po  x1 -Daily PT/INR     Hughes Better, PharmD, BCPS Clinical Pharmacist Pager: [phone] 05/25/2014 8:32 AM

## 2014-05-25 NOTE — Clinical Social Work Placement (Signed)
   CLINICAL SOCIAL WORK PLACEMENT  NOTE  Date:  05/25/2014  Patient Details  Name: [name] MRN: [id number] Date of Birth: June 27, 1925  Clinical Social Work is seeking post-discharge placement for this patient at the [place] level of care (*CSW will initial, date and re-position this form in  chart as items are completed):  Yes   Patient/family provided with [place] Work Department's list of facilities offering this level of care within the geographic area requested by the patient (or if unable, by the patient's family).  Yes   Patient/family informed of their freedom to choose among providers that offer the needed level of care, that participate in Medicare, Medicaid or managed care program needed by the patient, have an available bed and are willing to accept the patient.  Yes   Patient/family informed of [place]'s ownership interest in [facility] and [facility], as well as of the fact that they are under no obligation to receive care at these facilities.  PASRR submitted to EDS on 05/25/14     PASRR number received on       Existing PASRR number confirmed on 05/25/14     FL2 transmitted to all facilities in geographic area requested by pt/family on 05/25/14     FL2 transmitted to all facilities within larger geographic area on       Patient informed that his/her managed care company has contracts with or will negotiate with certain facilities, including the following:   (YES)         Patient/family informed of bed offers received.  Patient chooses bed at  [facility] )     Physician recommends and patient chooses bed at      Patient to be transferred to  [facility] ) on  .  Patient to be transferred to facility by       Patient family notified on   of transfer.  Name of family member notified:        PHYSICIAN Please prepare prescriptions, Please sign FL2     Additional Comment:     _______________________________________________ Rozell Searing, LCSW 05/25/2014, 11:42 AM

## 2014-05-25 NOTE — Clinical Social Work Note (Signed)
Clinical Social Work Assessment  Patient Details  Name: [name] MRN: [id number] Date of Birth: 1925/11/24  Date of referral:  05/25/14               Reason for consult:  Discharge Planning, Facility Placement                Permission sought to share information with:  Case Manager, Customer service manager, Family Supports  Housing/Transportation Living arrangements for the past 2 months:  [place] of Information:  Patient, Adult Children Patient Interpreter Needed:  None Criminal Activity/Legal Involvement Pertinent to Current Situation/Hospitalization:  No - Comment as needed Significant Relationships:  Adult Children, Other Family Members Lives with:  Self Do you feel safe going back to the place where you live?  Yes Need for family participation in patient care:  Yes (Comment)  Care giving concerns: No care giving concerns reported by family at this time.    Social Worker assessment / plan:  Holiday representative met with patient and pt's daughter, [name] present at bedside in reference to post-acute placement for SNF. CSW introduced CSW role and SNF process. Pt's reported she is a resident at [facility] within the independent living community. Pt further reported she has also been a resident within the SNF community and wishes to return if appropriate. Pt's dtr expressed that pt has not been evaluated by physical therapy at this time. CSW noted PT has evaluated pt and made recommendation for SNF placement. Pt and pt's dtr agreeable to SNF placement at [facility] only. CSW has completed FL-2 and faxed clinicals to [facility]. Facility coordinator contacted CSW to confirm that bed will be held until pt is medically stable for return. No further concerns reported. CSW will continue to follow pt and pt's family for continued support and to facilitate pt's discharge needs once medically stable.   Employment status:  Retired Engineer, technical sales, Managed Care PT Recommendations:  [place] / Referral to community resources:  [place]  Patient/Family's Response to care:  Pt sitting at bedside, alert and oriented X4. Pt and pt's dtr pleasant however stated "We are playing the waiting game I guess" referring to when pt will be evaluated by PT/OT. Pt and pt's dtr, [name] both agreeable to SNF placement at [facility].   Patient/Family's Understanding of and Emotional Response to Diagnosis, Current Treatment, and Prognosis:  Pt's dtr expressed understanding of pt's procedure and current treatment. Pt and pt's dtr reported they are agreeable to SNF placement for continued therapy. Pt's dtr replied the overall goal is for pt to return to independent living community once she completes rehab.   Emotional Assessment Appearance:  Appears younger than stated age Attitude/Demeanor/Rapport:   (Appropriate ) Affect (typically observed):  Accepting, Calm, Quiet Orientation:  Oriented to Self, Oriented to Place, Oriented to Situation, Oriented to  Time Alcohol / Substance use:  Never Used Psych involvement (Current and /or in the community):  No (Comment)  Discharge Needs  Concerns to be addressed:  No discharge needs identified Readmission within the last 30 days:  No Current discharge risk:  None Barriers to Discharge:  No Barriers Identified   Glendon Axe, MSW, LCSWA [phone] 05/25/2014 11:40 AM

## 2014-05-26 DIAGNOSIS — R269 Unspecified abnormalities of gait and mobility: Secondary | ICD-10-CM | POA: Diagnosis not present

## 2014-05-26 DIAGNOSIS — K5900 Constipation, unspecified: Secondary | ICD-10-CM | POA: Diagnosis not present

## 2014-05-26 DIAGNOSIS — E785 Hyperlipidemia, unspecified: Secondary | ICD-10-CM | POA: Diagnosis not present

## 2014-05-26 DIAGNOSIS — I4892 Unspecified atrial flutter: Secondary | ICD-10-CM | POA: Diagnosis not present

## 2014-05-26 DIAGNOSIS — E569 Vitamin deficiency, unspecified: Secondary | ICD-10-CM | POA: Diagnosis not present

## 2014-05-26 DIAGNOSIS — M6281 Muscle weakness (generalized): Secondary | ICD-10-CM | POA: Diagnosis not present

## 2014-05-26 DIAGNOSIS — Z5181 Encounter for therapeutic drug level monitoring: Secondary | ICD-10-CM | POA: Diagnosis not present

## 2014-05-26 DIAGNOSIS — I4891 Unspecified atrial fibrillation: Secondary | ICD-10-CM | POA: Diagnosis not present

## 2014-05-26 DIAGNOSIS — S82202S Unspecified fracture of shaft of left tibia, sequela: Secondary | ICD-10-CM | POA: Diagnosis not present

## 2014-05-26 DIAGNOSIS — G8929 Other chronic pain: Secondary | ICD-10-CM | POA: Diagnosis not present

## 2014-05-26 DIAGNOSIS — B999 Unspecified infectious disease: Secondary | ICD-10-CM | POA: Diagnosis not present

## 2014-05-26 DIAGNOSIS — F5101 Primary insomnia: Secondary | ICD-10-CM | POA: Diagnosis not present

## 2014-05-26 DIAGNOSIS — D509 Iron deficiency anemia, unspecified: Secondary | ICD-10-CM | POA: Diagnosis not present

## 2014-05-26 DIAGNOSIS — S82202A Unspecified fracture of shaft of left tibia, initial encounter for closed fracture: Secondary | ICD-10-CM | POA: Diagnosis not present

## 2014-05-26 DIAGNOSIS — I10 Essential (primary) hypertension: Secondary | ICD-10-CM | POA: Diagnosis not present

## 2014-05-26 DIAGNOSIS — Z472 Encounter for removal of internal fixation device: Secondary | ICD-10-CM | POA: Diagnosis not present

## 2014-05-26 DIAGNOSIS — K219 Gastro-esophageal reflux disease without esophagitis: Secondary | ICD-10-CM | POA: Diagnosis not present

## 2014-05-26 DIAGNOSIS — E039 Hypothyroidism, unspecified: Secondary | ICD-10-CM | POA: Diagnosis not present

## 2014-05-26 DIAGNOSIS — Z9181 History of falling: Secondary | ICD-10-CM | POA: Diagnosis not present

## 2014-05-26 LAB — PROTIME-INR
INR: 2.07 — ABNORMAL HIGH (ref 0.00–1.49)
Prothrombin Time: 23.5 seconds — ABNORMAL HIGH (ref 11.6–15.2)

## 2014-05-26 MED ORDER — DOXYCYCLINE HYCLATE 100 MG PO TABS
100.0000 mg | ORAL_TABLET | Freq: Two times a day (BID) | ORAL | Status: DC
  Administered 2014-05-26: 100 mg via ORAL
  Filled 2014-05-26: qty 1

## 2014-05-26 MED ORDER — MAGNESIUM CITRATE PO SOLN
1.0000 | Freq: Once | ORAL | Status: AC
  Administered 2014-05-26: 1 via ORAL
  Filled 2014-05-26: qty 296

## 2014-05-26 MED ORDER — HYDROCODONE-ACETAMINOPHEN 5-325 MG PO TABS: 1.0000 | ORAL_TABLET | Freq: Four times a day (QID) | ORAL | Status: DC | PRN

## 2014-05-26 MED ORDER — WARFARIN SODIUM 5 MG PO TABS: 5.0000 mg | ORAL_TABLET | Freq: Once | ORAL | Status: DC

## 2014-05-26 NOTE — Progress Notes (Signed)
[place] for coumadin Indication: History of AFib + VTE prophylaxis  Allergies  Allergen Reactions  . Atenolol Nausea And Vomiting  . Clarithromycin Nausea And Vomiting  . Codeine Sulfate Nausea Only  . Macrodantin Other (See Comments)    Long time ago - reaction unknown  . Percocet [Oxycodone-Acetaminophen] Nausea And Vomiting  . Zosyn [Piperacillin Sod-Tazobactam So] Rash    Morbilliform eruption with itching    Patient Measurements: Height: 5\' 3"  (160 cm) Weight: 188 lb (85.276 kg) IBW/kg (Calculated) : 52.4   Vital Signs: Temp: 98.1 F (36.7 C) (05/06 0602) Temp Source: Oral (05/06 0602) BP: 118/63 mmHg (05/06 0602) Pulse Rate: 82 (05/06 0602)  Labs:  Recent Labs  05/23/14 1016  05/24/14 0728 05/25/14 0815 05/26/14 0532  APTT 32  --   --   --   --   LABPROT  --   < > 16.0* [name]4* 23.5*  INR  --   < > 1.27 1.73* 2.07*  < > = values in this interval not displayed.  Estimated Creatinine Clearance: 48.1 mL/min (by C-G formula based on Cr of 0.87).   Medications:  Scheduled:  . atorvastatin  10 mg Oral QPM  . B-complex with vitamin C  1 tablet Oral Daily  . busPIRone  7.5 mg Oral BID  . cholecalciferol  1,000 Units Oral Daily  . diltiazem  120 mg Oral Daily  . ferrous sulfate  325 mg Oral Q breakfast  . furosemide  40 mg Oral Q M,W,F  . levothyroxine  25 mcg Oral QAC breakfast  . metoprolol succinate  50 mg Oral Daily  . potassium chloride  10 mEq Oral Daily  . senna  1 tablet Oral BID  . temazepam  30 mg Oral QHS  . Warfarin - Pharmacist Dosing Inpatient   Does not apply q1800    Assessment: [name] with history of AFib on Coumadin s/p removal of hardware to left tibia, placement of antibiotic beads, and fistula debridement.INR 2, trending up appropriately.  Home coumadin dose: 2.5mg  daily except 5 mg on Mon and Fri  Goal of Therapy:  INR 2-3 Monitor platelets by anticoagulation protocol: Yes   Plan:    -Coumadin 5 mg po x1 -Daily PT/INR     Hughes Better, PharmD, BCPS Clinical Pharmacist Pager: [phone] 05/26/2014 7:54 AM

## 2014-05-26 NOTE — Progress Notes (Signed)
Discharge orders received.  Discharge instructions reviewed with the patient and her daughter.  VSS upon discharge.  IV removed and education complete.  Transported out via PTAR.  Attempted to call report to [facility] floor nurses as well as [facility].     Cori Razor, RN

## 2014-05-26 NOTE — Clinical Social Work Placement (Signed)
   CLINICAL SOCIAL WORK PLACEMENT  NOTE  Date:  05/26/2014  Patient Details  Name: [name] MRN: [id number] Date of Birth: [birth year]  Clinical Social Work is seeking post-discharge placement for this patient at the [place] level of care (*CSW will initial, date and re-position this form in  chart as items are completed):  Yes   Patient/family provided with [place] Work Department's list of facilities offering this level of care within the geographic area requested by the patient (or if unable, by the patient's family).  Yes   Patient/family informed of their freedom to choose among providers that offer the needed level of care, that participate in Medicare, Medicaid or managed care program needed by the patient, have an available bed and are willing to accept the patient.  Yes   Patient/family informed of [place]'s ownership interest in [facility] and [facility], as well as of the fact that they are under no obligation to receive care at these facilities.  PASRR submitted to EDS on 05/25/14     PASRR number received on       Existing PASRR number confirmed on 05/25/14     FL2 transmitted to all facilities in geographic area requested by pt/family on 05/25/14     FL2 transmitted to all facilities within larger geographic area on       Patient informed that his/her managed care company has contracts with or will negotiate with certain facilities, including the following:   (YES)         Patient/family informed of bed offers received.  Patient chooses bed at  [facility] )     Physician recommends and patient chooses bed at      Patient to be transferred to  [facility] ) on 05/26/14.  Patient to be transferred to facility by  [facility] )     Patient family notified on 05/26/14 of transfer.  Name of family member notified:  Pt's dtr, Cindy      PHYSICIAN Please prepare prescriptions, Please sign FL2      Additional Comment:    _______________________________________________ Rozell Searing, LCSW 05/26/2014, 9:38 AM

## 2014-05-26 NOTE — Discharge Summary (Signed)
Physician Discharge Summary  Patient ID: [name] MRN: [id number] DOB/AGE: 1925-03-26 81 y.o.  Admit date: 05/23/2014 Discharge date: 05/26/2014  Admission Diagnoses:  Active Problems:   Tibia fracture   Discharge Diagnoses:  Same  Surgeries: Procedure(s): HARDWARE REMOVAL on 05/23/2014   Consultants:    Discharged Condition: Stable  Hospital Course: [name] is an 81 y.o. 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  They were brought to the operating room on 05/23/2014 and underwent the above named procedures.She was slow to mobilize with PT and required SNF - WBAT left leg in fracture boot. On doxycycline for 1 week post op. Follow up 10 days    Antibiotics given:  Anti-infectives    Start     Dose/Rate Route Frequency Ordered Stop   05/25/14 0900  ceFAZolin (ANCEF) IVPB 2 g/50 mL premix     2 g 100 mL/hr over 30 Minutes Intravenous Every 6 hours 05/25/14 0818 05/25/14 1454   05/24/14 0745  ceFAZolin (ANCEF) IVPB 2 g/50 mL premix     2 g 100 mL/hr over 30 Minutes Intravenous Every 6 hours 05/24/14 0702 05/25/14 0114   05/24/14 0200  vancomycin (VANCOCIN) IVPB 1000 mg/200 mL premix     1,000 mg 200 mL/hr over 60 Minutes Intravenous Every 12 hours 05/23/14 1757 05/24/14 0156   05/23/14 1945  ceFAZolin (ANCEF) IVPB 2 g/50 mL premix     2 g 100 mL/hr over 30 Minutes Intravenous Every 6 hours 05/23/14 1757 05/24/14 0036   05/23/14 1412  gentamicin (GARAMYCIN) injection  Status:  Discontinued       As needed 05/23/14 1413 05/23/14 1545   05/23/14 1410  vancomycin (VANCOCIN) powder  Status:  Discontinued       As needed 05/23/14 1411 05/23/14 1545   05/23/14 0600  vancomycin (VANCOCIN) IVPB 1000 mg/200 mL premix  Status:  Discontinued     1,000 mg 200 mL/hr over 60 Minutes Intravenous On call to O.R. 05/22/14 1433 05/23/14 0954     .  Recent vital signs:  Filed Vitals:   05/26/14 0602  BP: 118/63  Pulse: 82  Temp: 98.1 F (36.7 C)  Resp: 18    Recent laboratory studies:  Results for orders placed or performed during the hospital encounter of 05/23/14  Anaerobic culture  Result Value Ref Range   Specimen Description TISSUE    Special Requests LEFT TIBIAL     Gram Stain      NO WBC SEEN NO SQUAMOUS EPITHELIAL CELLS SEEN NO ORGANISMS SEEN Performed at [facility]    Culture      NO ANAEROBES ISOLATED; CULTURE IN PROGRESS FOR 5 DAYS Performed at [facility]    Report Status PENDING   Tissue culture  Result Value Ref Range   Specimen Description TISSUE    Special Requests LEFT TIBIAL    Gram Stain      NO WBC SEEN NO SQUAMOUS EPITHELIAL CELLS SEEN NO ORGANISMS SEEN Performed at [facility]    Culture      RARE STAPHYLOCOCCUS AUREUS Note: RIFAMPIN AND GENTAMICIN SHOULD NOT BE USED AS SINGLE DRUGS FOR TREATMENT OF STAPH INFECTIONS. Performed at [facility]    Report Status PENDING   Protime-INR  Result Value Ref Range   Prothrombin Time 15.4 (H) 11.6 - 15.2 seconds   INR 1.21 0.00 -  1.49  APTT  Result Value Ref Range   aPTT 32 24 - 37 seconds  Protime-INR  Result Value Ref Range   Prothrombin Time 16.0 (H) 11.6 - 15.2 seconds   INR 1.27 0.00 - 1.49  Protime-INR  Result Value Ref Range   Prothrombin Time 20.4 (H) 11.6 - 15.2 seconds   INR 1.73 (H) 0.00 - 1.49  Protime-INR  Result Value Ref Range   Prothrombin Time 23.5 (H) 11.6 - 15.2 seconds   INR 2.07 (H) 0.00 - 1.49    Discharge Medications:     Medication List    STOP taking these medications        traMADol 50 MG tablet  Commonly known as:  ULTRAM      TAKE these medications        acetaminophen 500 MG tablet  Commonly known as:  TYLENOL  Take 500 mg by mouth every 6 (six) hours as needed (pain).     atorvastatin 10 MG tablet  Commonly known as:  LIPITOR  Take 1 tablet (10 mg  total) by mouth every evening.     B-complex with vitamin C tablet  Take 1 tablet by mouth daily.     busPIRone 7.5 MG tablet  Commonly known as:  BUSPAR  Take 1 tablet (7.5 mg total) by mouth 2 (two) times daily.     cholecalciferol 1000 UNITS tablet  Commonly known as:  VITAMIN D  Take 1,000 Units by mouth daily.     diltiazem 120 MG 24 hr capsule  Commonly known as:  CARDIZEM CD  TAKE 1 CAPSULE (120 MG TOTAL) BY MOUTH DAILY.     doxycycline 100 MG EC tablet  Commonly known as:  DORYX  Take 100 mg by mouth 2 (two) times daily.     furosemide 40 MG tablet  Commonly known as:  LASIX  1 po on Mon-Wed-Fri in am     HYDROcodone-acetaminophen 5-325 MG per tablet  Commonly known as:  NORCO  Take 1-2 tablets by mouth every 6 (six) hours as needed for moderate pain.     Iron 325 (65 FE) MG Tabs  Take 1 tablet by mouth daily.     KLOR-CON M10 10 MEQ tablet  Generic drug:  potassium chloride  Take 10 mEq by mouth daily.     levothyroxine 25 MCG tablet  Commonly known as:  SYNTHROID, LEVOTHROID  TAKE 1 TABLET BY MOUTH ONCE DAILY FOR THYROID     metoprolol succinate 50 MG 24 hr tablet  Commonly known as:  TOPROL-XL  TAKE 1 TABLET (50 MG TOTAL) BY MOUTH 2 (TWO) TIMES DAILY. TAKE WITH OR IMMEDIATELY FOLLOWING A MEAL.     pantoprazole 40 MG tablet  Commonly known as:  PROTONIX  Take 1 tablet (40 mg total) by mouth daily before breakfast.     polyethylene glycol packet  Commonly known as:  MIRALAX / GLYCOLAX  Take 17 g by mouth daily as needed for moderate constipation.     temazepam 30 MG capsule  Commonly known as:  RESTORIL  Take 1 capsule (30 mg total) by mouth at bedtime.     vitamin C 500 MG tablet  Commonly known as:  ASCORBIC ACID  Take 500 mg by mouth daily.     warfarin 2.5 MG tablet  Commonly known as:  COUMADIN  - Take 1-2 tablets (2.5-5 mg total) by mouth daily. Take 2.5mg  Monday, Tuesday, Wednesday, Thursday, Friday and Saturday.  Take 5mg  on Sunday.  -    -   -  Patient to resume this after completion of all her surgical procedures     ZINC PO  Take 1 tablet by mouth daily.        Diagnostic Studies: Dg Tibia/fibula Left  05/23/2014   CLINICAL DATA:  Interval removal tibia hardware for infection.  EXAM: DG C-ARM 61-120 MIN; LEFT TIBIA AND FIBULA - 2 VIEW  COMPARISON:  CT 04/20/2014  FINDINGS: Interval removal tibia intramedullary nail. Antibiotic laden beads have been placed. Unchanged distal tibia fracture with solid osseous bridging laterally. Unchanged cortical defects with incomplete osseous bridging medially. There is mild cortical irregularity about the medial aspect of the distal fibular fracture.  IMPRESSION: Interval removal tibial hardware for infection.  Mild cortical irregularity medial distal fibula diaphysis grossly similar when compared to prior CT, most compatible with healed fracture.   Electronically Signed   By: Lovey Newcomer M.D.   On: 05/23/2014 15:39   Dg Ankle Left Port  05/23/2014   CLINICAL DATA:  81 year old female with a history of prior hardware removal and antibiotic bead placement  EXAM: PORTABLE LEFT ANKLE - 2 VIEW  COMPARISON:  Fluoroscopy 05/23/2014, CT 04/20/2014, plain film 10/03/2013  FINDINGS: Compared to prior studies there is interval removal of the left-sided intra medullary rod and screw fixation, with interval placement of antibiotic bead within the intra medullary defect.  Interval callus and partial remodeling of the fracture sites at the proximal and distal fibula.  Lucency at the distal tibial diaphysis with partial remodeling/healing.  Soft tissue swelling present, compatible with interval surgery.  IMPRESSION: Early postop changes of interval hardware removal of the left distal tibia, as above, with placement of intra medullary antibiotic beads.  Partial healing and remodeling of proximal and distal fibular fractures, as well as the distal tibial fracture.  Lucency defect at the distal diaphysis remains.   Signed,  Dulcy Fanny. Earleen Newport, DO  [facility]  [facility]   Electronically Signed   By: Corrie Mckusick D.O.   On: 05/23/2014 17:26   Dg C-arm 1-60 Min  05/23/2014   CLINICAL DATA:  Interval removal tibia hardware for infection.  EXAM: DG C-ARM 61-120 MIN; LEFT TIBIA AND FIBULA - 2 VIEW  COMPARISON:  CT 04/20/2014  FINDINGS: Interval removal tibia intramedullary nail. Antibiotic laden beads have been placed. Unchanged distal tibia fracture with solid osseous bridging laterally. Unchanged cortical defects with incomplete osseous bridging medially. There is mild cortical irregularity about the medial aspect of the distal fibular fracture.  IMPRESSION: Interval removal tibial hardware for infection.  Mild cortical irregularity medial distal fibula diaphysis grossly similar when compared to prior CT, most compatible with healed fracture.   Electronically Signed   By: Lovey Newcomer M.D.   On: 05/23/2014 15:39    Disposition: 01-Home or Self Care      Discharge Instructions    Call MD / Call 911    Complete by:  As directed   If you experience chest pain or shortness of breath, CALL 911 and be transported to the hospital emergency room.  If you develope a fever above 101 F, pus (white drainage) or increased drainage or redness at the wound, or calf pain, call your surgeon's office.     Constipation Prevention    Complete by:  As directed   Drink plenty of fluids.  Prune juice may be helpful.  You may use a stool softener, such as Colace (over the counter) 100 mg twice a day.  Use MiraLax (over the counter)  for constipation as needed.     Diet - low sodium heart healthy    Complete by:  As directed      Discharge instructions    Complete by:  As directed   Weight bearing as tolerated in fracture boot left leg Keep incisions dry Change dressing every other day     Increase activity slowly as tolerated    Complete by:  As directed                Signed: Minha Fulco SCOTT 05/26/2014, 8:11 AM

## 2014-05-26 NOTE — Progress Notes (Signed)
Subjective: Pt stable - mobilizing slowly - PT recommends snf   Objective: Vital signs in last 24 hours: Temp:  [97.9 F (36.6 C)-98.6 F (37 C)] 98.1 F (36.7 C) (05/06 0602) Pulse Rate:  [82-111] 82 (05/06 0602) Resp:  [18-20] 18 (05/06 0602) BP: (88-122)/(45-69) 118/63 mmHg (05/06 0602) SpO2:  [93 %-98 %] 94 % (05/06 0602)  Intake/Output from previous day: 05/05 0701 - 05/06 0700 In: 360 [P.O.:360] Out: 250 [Urine:250] Intake/Output this shift:    Exam:  dressing dry  Labs: No results for input(s): HGB in the last 72 hours. No results for input(s): WBC, RBC, HCT, PLT in the last 72 hours. No results for input(s): NA, K, CL, CO2, BUN, CREATININE, GLUCOSE, CALCIUM in the last 72 hours.  Recent Labs  05/25/14 0815 05/26/14 0532  INR 1.73* 2.07*    Assessment/Plan: Plan dc when bed available - dc tramadol - mag citrate   DEAN,GREGORY SCOTT 05/26/2014, 8:05 AM

## 2014-05-26 NOTE — Clinical Social Work Note (Signed)
Clinical Social Worker facilitated patient discharge including contacting patient family and facility to confirm patient discharge plans. Clinical information faxed to facility and family agreeable with plan. CSW arranged ambulance transport via [facility] to [facility]. RN to call report prior to discharge.  Pt's dtr, Jenny Reichmann requesting for pt to receive bath and follow up with PT before departing. CSW notified bedside RN. DC packet prepared and on chart for transport with number for report.   Clinical Social Worker will sign off for now as social work intervention is no longer needed. Please consult [place] again if new need arises.  Glendon Axe, MSW, LCSWA [phone] 05/26/2014 10:26 AM

## 2014-05-27 LAB — TISSUE CULTURE: Gram Stain: NONE SEEN

## 2014-05-28 LAB — ANAEROBIC CULTURE: Gram Stain: NONE SEEN

## 2014-05-29 ENCOUNTER — Telehealth: Payer: Self-pay | Admitting: *Deleted

## 2014-05-29 NOTE — Telephone Encounter (Signed)
Caller requesting Rf for temazepam 30 mg 30 day supply be faxed to [facility] while pt is staying there for PT/Rehab. Please advise in PCP's absence.  Thanks!

## 2014-05-30 MED ORDER — TEMAZEPAM 30 MG PO CAPS: 30.0000 mg | ORAL_CAPSULE | Freq: Every day | ORAL | Status: DC

## 2014-05-30 NOTE — Telephone Encounter (Signed)
Printed and signed.  

## 2014-05-30 NOTE — Telephone Encounter (Signed)
Rx faxed to [facility] per callers request.

## 2014-05-30 NOTE — Telephone Encounter (Signed)
Deneise Lever, from [facility] called again about his med refill   [phone]

## 2014-05-31 ENCOUNTER — Ambulatory Visit (INDEPENDENT_AMBULATORY_CARE_PROVIDER_SITE_OTHER): Payer: Medicare Other | Admitting: *Deleted

## 2014-05-31 DIAGNOSIS — I4892 Unspecified atrial flutter: Secondary | ICD-10-CM

## 2014-05-31 DIAGNOSIS — S82202S Unspecified fracture of shaft of left tibia, sequela: Secondary | ICD-10-CM | POA: Diagnosis not present

## 2014-05-31 DIAGNOSIS — Z5181 Encounter for therapeutic drug level monitoring: Secondary | ICD-10-CM

## 2014-05-31 LAB — POCT INR: INR: 2.3

## 2014-06-05 ENCOUNTER — Encounter (HOSPITAL_BASED_OUTPATIENT_CLINIC_OR_DEPARTMENT_OTHER): Payer: Medicare Other | Attending: Plastic Surgery

## 2014-06-05 DIAGNOSIS — S81802A Unspecified open wound, left lower leg, initial encounter: Secondary | ICD-10-CM | POA: Diagnosis not present

## 2014-06-05 DIAGNOSIS — Y839 Surgical procedure, unspecified as the cause of abnormal reaction of the patient, or of later complication, without mention of misadventure at the time of the procedure: Secondary | ICD-10-CM | POA: Insufficient documentation

## 2014-06-05 DIAGNOSIS — T8189XA Other complications of procedures, not elsewhere classified, initial encounter: Secondary | ICD-10-CM | POA: Insufficient documentation

## 2014-06-08 DIAGNOSIS — R262 Difficulty in walking, not elsewhere classified: Secondary | ICD-10-CM | POA: Diagnosis not present

## 2014-06-08 DIAGNOSIS — Z472 Encounter for removal of internal fixation device: Secondary | ICD-10-CM | POA: Diagnosis not present

## 2014-06-12 DIAGNOSIS — Z472 Encounter for removal of internal fixation device: Secondary | ICD-10-CM | POA: Diagnosis not present

## 2014-06-12 DIAGNOSIS — R262 Difficulty in walking, not elsewhere classified: Secondary | ICD-10-CM | POA: Diagnosis not present

## 2014-06-14 ENCOUNTER — Ambulatory Visit (INDEPENDENT_AMBULATORY_CARE_PROVIDER_SITE_OTHER): Payer: Medicare Other

## 2014-06-14 DIAGNOSIS — I4892 Unspecified atrial flutter: Secondary | ICD-10-CM | POA: Diagnosis not present

## 2014-06-14 DIAGNOSIS — S82202E Unspecified fracture of shaft of left tibia, subsequent encounter for open fracture type I or II with routine healing: Secondary | ICD-10-CM | POA: Diagnosis not present

## 2014-06-14 DIAGNOSIS — Z5181 Encounter for therapeutic drug level monitoring: Secondary | ICD-10-CM | POA: Diagnosis not present

## 2014-06-14 LAB — POCT INR: INR: 3.5

## 2014-06-15 DIAGNOSIS — R262 Difficulty in walking, not elsewhere classified: Secondary | ICD-10-CM | POA: Diagnosis not present

## 2014-06-15 DIAGNOSIS — Z472 Encounter for removal of internal fixation device: Secondary | ICD-10-CM | POA: Diagnosis not present

## 2014-06-19 DIAGNOSIS — Z472 Encounter for removal of internal fixation device: Secondary | ICD-10-CM | POA: Diagnosis not present

## 2014-06-19 DIAGNOSIS — R262 Difficulty in walking, not elsewhere classified: Secondary | ICD-10-CM | POA: Diagnosis not present

## 2014-06-21 ENCOUNTER — Other Ambulatory Visit: Payer: Self-pay | Admitting: Internal Medicine

## 2014-06-21 DIAGNOSIS — S82202E Unspecified fracture of shaft of left tibia, subsequent encounter for open fracture type I or II with routine healing: Secondary | ICD-10-CM | POA: Diagnosis not present

## 2014-06-22 DIAGNOSIS — Z472 Encounter for removal of internal fixation device: Secondary | ICD-10-CM | POA: Diagnosis not present

## 2014-06-22 DIAGNOSIS — R262 Difficulty in walking, not elsewhere classified: Secondary | ICD-10-CM | POA: Diagnosis not present

## 2014-06-26 ENCOUNTER — Encounter (HOSPITAL_BASED_OUTPATIENT_CLINIC_OR_DEPARTMENT_OTHER): Payer: Medicare Other | Attending: Plastic Surgery

## 2014-06-26 DIAGNOSIS — Z472 Encounter for removal of internal fixation device: Secondary | ICD-10-CM | POA: Diagnosis not present

## 2014-06-26 DIAGNOSIS — R262 Difficulty in walking, not elsewhere classified: Secondary | ICD-10-CM | POA: Diagnosis not present

## 2014-06-26 DIAGNOSIS — L97924 Non-pressure chronic ulcer of unspecified part of left lower leg with necrosis of bone: Secondary | ICD-10-CM | POA: Insufficient documentation

## 2014-06-28 ENCOUNTER — Ambulatory Visit (INDEPENDENT_AMBULATORY_CARE_PROVIDER_SITE_OTHER): Payer: Medicare Other | Admitting: *Deleted

## 2014-06-28 DIAGNOSIS — S82202E Unspecified fracture of shaft of left tibia, subsequent encounter for open fracture type I or II with routine healing: Secondary | ICD-10-CM | POA: Diagnosis not present

## 2014-06-28 DIAGNOSIS — Z5181 Encounter for therapeutic drug level monitoring: Secondary | ICD-10-CM

## 2014-06-28 DIAGNOSIS — I4892 Unspecified atrial flutter: Secondary | ICD-10-CM | POA: Diagnosis not present

## 2014-06-28 DIAGNOSIS — L03116 Cellulitis of left lower limb: Secondary | ICD-10-CM | POA: Diagnosis not present

## 2014-06-28 LAB — POCT INR: INR: 2.5

## 2014-07-05 ENCOUNTER — Ambulatory Visit (INDEPENDENT_AMBULATORY_CARE_PROVIDER_SITE_OTHER): Payer: Medicare Other | Admitting: Internal Medicine

## 2014-07-05 ENCOUNTER — Encounter: Payer: Self-pay | Admitting: Internal Medicine

## 2014-07-05 VITALS — BP 120/60 | HR 78 | Wt 188.0 lb

## 2014-07-05 DIAGNOSIS — I10 Essential (primary) hypertension: Secondary | ICD-10-CM

## 2014-07-05 DIAGNOSIS — D509 Iron deficiency anemia, unspecified: Secondary | ICD-10-CM | POA: Diagnosis not present

## 2014-07-05 DIAGNOSIS — R32 Unspecified urinary incontinence: Secondary | ICD-10-CM | POA: Insufficient documentation

## 2014-07-05 DIAGNOSIS — E039 Hypothyroidism, unspecified: Secondary | ICD-10-CM

## 2014-07-05 DIAGNOSIS — I4892 Unspecified atrial flutter: Secondary | ICD-10-CM | POA: Diagnosis not present

## 2014-07-05 DIAGNOSIS — N393 Stress incontinence (female) (male): Secondary | ICD-10-CM

## 2014-07-05 DIAGNOSIS — R609 Edema, unspecified: Secondary | ICD-10-CM

## 2014-07-05 MED ORDER — MIRABEGRON ER 50 MG PO TB24: 50.0000 mg | ORAL_TABLET | Freq: Every day | ORAL | Status: DC

## 2014-07-05 NOTE — Assessment & Plan Note (Addendum)
Metoprolol, Diltiazem On Coumadin Recurrent  

## 2014-07-05 NOTE — Patient Instructions (Signed)
We may need to d/c Diltiazem to reduce leg swelling and promote leg wound healing

## 2014-07-05 NOTE — Assessment & Plan Note (Signed)
6/16 Aggravated by Furosemide Try Myrbetriq

## 2014-07-05 NOTE — Assessment & Plan Note (Signed)
Metoprolol, Diltiazem

## 2014-07-05 NOTE — Assessment & Plan Note (Signed)
We may need to d/c Diltiazem to reduce leg swelling and promote leg wound healing

## 2014-07-05 NOTE — Assessment & Plan Note (Signed)
On iron 

## 2014-07-05 NOTE — Progress Notes (Signed)
   Subjective:     HPI  F/u LBP, h/o LLE open fx - slow healing wound is still open - Wound clinic weekly. The rod is out now. 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 LBP is bad w/activity  C/o incontinence  BP Readings from Last 3 Encounters:  07/05/14 120/60  05/26/14 134/73  05/19/14 139/68   Wt Readings from Last 3 Encounters:  07/05/14 188 lb (85.276 kg)  05/23/14 188 lb (85.276 kg)  05/19/14 188 lb 7.9 oz (85.5 kg)      Review of Systems  Constitutional: Positive for fatigue. Negative for chills, activity change, appetite change and unexpected weight change.  HENT: Negative for congestion, mouth sores and sinus pressure.   Eyes: Negative for visual disturbance.  Respiratory: Negative for cough and chest tightness.   Gastrointestinal: Negative for nausea and abdominal pain.  Genitourinary: Negative for frequency, difficulty urinating and vaginal pain.  Musculoskeletal: Positive for back pain. Negative for gait problem.  Skin: Negative for pallor and rash.  Neurological: Negative for dizziness, tremors, weakness, numbness and headaches.  Psychiatric/Behavioral: Negative for suicidal ideas, confusion and sleep disturbance. The patient is not nervous/anxious.        Objective:   Physical Exam  Constitutional: She appears well-developed. No distress.  HENT:  Head: Normocephalic.  Right Ear: External ear normal.  Left Ear: External ear normal.  Nose: Nose normal.  Mouth/Throat: Oropharynx is clear and moist.  Eyes: Conjunctivae are normal. Pupils are equal, round, and reactive to light. Right eye exhibits no discharge. Left eye exhibits no discharge.  Neck: Normal range of motion. Neck supple. No JVD present. No tracheal deviation present. No thyromegaly present.  Cardiovascular: Normal rate, regular rhythm and normal heart sounds.   Pulmonary/Chest: No  stridor. No respiratory distress. She has no wheezes.  Abdominal: Soft. Bowel sounds are normal. She exhibits no distension and no mass. There is no tenderness. There is no rebound and no guarding.  Musculoskeletal: She exhibits no edema or tenderness.  Lymphadenopathy:    She has no cervical adenopathy.  Neurological: She displays normal reflexes. No cranial nerve deficit. She exhibits normal muscle tone. Coordination abnormal.  Walker   Skin: No rash noted. No erythema.  Psychiatric: She has a normal mood and affect. Her behavior is normal. Judgment and thought content normal.  LLE wound is dressed Using a walker Trace to 1+ edema B   Lab Results  Component Value Date   WBC 5.4 05/19/2014   HGB 13.9 05/19/2014   HCT 42.3 05/19/2014   PLT 238 05/19/2014   GLUCOSE 91 05/19/2014   CHOL 116 06/25/2012   TRIG 82 09/05/2013   HDL 41.50 06/25/2012   LDLDIRECT 110.3 05/24/2008   LDLCALC 66 06/25/2012   ALT 19 01/17/2014   AST 24 01/17/2014   NA 138 05/19/2014   K 3.9 05/19/2014   CL 104 05/19/2014   CREATININE 0.87 05/19/2014   BUN 10 05/19/2014   CO2 25 05/19/2014   TSH 0.51 01/17/2014   INR 2.5 06/28/2014   HGBA1C 6.1* 09/08/2013       Assessment & Plan:

## 2014-07-05 NOTE — Assessment & Plan Note (Signed)
On Levothroid 

## 2014-07-05 NOTE — Progress Notes (Signed)
Pre visit review using our clinic review tool, if applicable. No additional management support is needed unless otherwise documented below in the visit note. 

## 2014-07-10 DIAGNOSIS — L97924 Non-pressure chronic ulcer of unspecified part of left lower leg with necrosis of bone: Secondary | ICD-10-CM | POA: Diagnosis not present

## 2014-07-21 DIAGNOSIS — C44519 Basal cell carcinoma of skin of other part of trunk: Secondary | ICD-10-CM | POA: Diagnosis not present

## 2014-07-21 DIAGNOSIS — I8312 Varicose veins of left lower extremity with inflammation: Secondary | ICD-10-CM | POA: Diagnosis not present

## 2014-07-21 DIAGNOSIS — I8311 Varicose veins of right lower extremity with inflammation: Secondary | ICD-10-CM | POA: Diagnosis not present

## 2014-07-21 DIAGNOSIS — Z85828 Personal history of other malignant neoplasm of skin: Secondary | ICD-10-CM | POA: Diagnosis not present

## 2014-07-21 DIAGNOSIS — I872 Venous insufficiency (chronic) (peripheral): Secondary | ICD-10-CM | POA: Diagnosis not present

## 2014-07-27 ENCOUNTER — Ambulatory Visit (INDEPENDENT_AMBULATORY_CARE_PROVIDER_SITE_OTHER): Payer: Medicare Other | Admitting: *Deleted

## 2014-07-27 DIAGNOSIS — I4892 Unspecified atrial flutter: Secondary | ICD-10-CM | POA: Diagnosis not present

## 2014-07-27 DIAGNOSIS — Z5181 Encounter for therapeutic drug level monitoring: Secondary | ICD-10-CM | POA: Diagnosis not present

## 2014-07-27 LAB — POCT INR: INR: 3.3

## 2014-07-28 ENCOUNTER — Telehealth: Payer: Self-pay | Admitting: *Deleted

## 2014-07-28 NOTE — Telephone Encounter (Signed)
Spoke with pt and informed her that had received call from Dr Leafy Ro PA that she is to hold coumadin the day before procedure and the day of the procedure and to  ask Dr Migdalia Dk when she is to restart coumadin and to restart at same dose and to keep her scheduled appt at the coumadin clinic and she states understanding. This nurse has also spoken with Cyril Mourning Pharmacist at [facility] and she approved of the instructions regarding holding coumadin

## 2014-07-30 ENCOUNTER — Other Ambulatory Visit: Payer: Self-pay | Admitting: Internal Medicine

## 2014-08-01 DIAGNOSIS — Z85828 Personal history of other malignant neoplasm of skin: Secondary | ICD-10-CM | POA: Diagnosis not present

## 2014-08-01 DIAGNOSIS — C4441 Basal cell carcinoma of skin of scalp and neck: Secondary | ICD-10-CM | POA: Diagnosis not present

## 2014-08-01 DIAGNOSIS — C44519 Basal cell carcinoma of skin of other part of trunk: Secondary | ICD-10-CM | POA: Diagnosis not present

## 2014-08-03 ENCOUNTER — Encounter (HOSPITAL_COMMUNITY): Payer: Self-pay

## 2014-08-03 ENCOUNTER — Encounter (HOSPITAL_COMMUNITY)
Admission: RE | Admit: 2014-08-03 | Discharge: 2014-08-03 | Disposition: A | Discharge status: Home / Self Care | Payer: Medicare Other | Source: Ambulatory Visit | Attending: Plastic Surgery | Admitting: Plastic Surgery

## 2014-08-03 DIAGNOSIS — L97829 Non-pressure chronic ulcer of other part of left lower leg with unspecified severity: Secondary | ICD-10-CM | POA: Diagnosis not present

## 2014-08-03 DIAGNOSIS — Z01818 Encounter for other preprocedural examination: Secondary | ICD-10-CM | POA: Diagnosis not present

## 2014-08-03 NOTE — Pre-Procedure Instructions (Addendum)
    DORISE GANGI  08/03/2014       Your procedure is scheduled on  Wednesday, July 20   Report to [facility] Admitting at 10:30 A.M.   Call this number if you have problems the morning of surgery:  [phone]              For any other questions, please call [phone], Monday - Friday 8 AM - 4 PM.   Remember:  Do not eat food or drink liquids after midnight Tuesday, July 19.  Take these medicines the morning of surgery with A SIP OF WATER :busPIRone (BUSPAR), diltiazem (CARDIZEM CD), levothyroxine (SYNTHROID, LEVOTHROID), metoprolol succinate (TOPROL-XL).                   Take if needed: acetaminophen (TYLENOL).                  Stop taking Vitamins.                 Stop taking Coumadin per Dr. Leafy Ro instruction.    Do not wear jewelry, make-up or nail polish.  Do not wear lotions, powders, or perfumes.    Do not shave 48 hours prior to surgery.    Do not bring valuables to the hospital.  [facility] is not responsible for any belongings or valuables.  Contacts, dentures or bridgework may not be worn into surgery.  Leave your suitcase in the car.  After surgery it may be brought to your room.  For patients admitted to the hospital, discharge time will be determined by your treatment team.  Patients discharged the day of surgery will not be allowed to drive home.   Name and phone number of your driver:   -  Special instructions: Review  [facility] - Preparing For Surgery.  Please read over the following fact sheets that you were given. Pain Booklet, Coughing and Deep Breathing and Surgical Site Infection Prevention

## 2014-08-04 ENCOUNTER — Other Ambulatory Visit: Payer: Self-pay | Admitting: Plastic Surgery

## 2014-08-04 DIAGNOSIS — S81802D Unspecified open wound, left lower leg, subsequent encounter: Secondary | ICD-10-CM

## 2014-08-08 MED ORDER — CIPROFLOXACIN IN D5W 400 MG/200ML IV SOLN
400.0000 mg | INTRAVENOUS | Status: AC
  Administered 2014-08-09: 400 mg via INTRAVENOUS
  Filled 2014-08-08: qty 200

## 2014-08-08 NOTE — Progress Notes (Signed)
Pt made aware to arrive at 11:30 AM tomorrow.

## 2014-08-09 ENCOUNTER — Encounter (HOSPITAL_COMMUNITY)
Admission: RE | Disposition: A | Discharge status: Home / Self Care | Payer: Self-pay | Source: Ambulatory Visit | Attending: Plastic Surgery

## 2014-08-09 ENCOUNTER — Ambulatory Visit (HOSPITAL_COMMUNITY): Payer: Medicare Other | Admitting: Anesthesiology

## 2014-08-09 ENCOUNTER — Encounter (HOSPITAL_COMMUNITY): Payer: Self-pay | Admitting: Plastic Surgery

## 2014-08-09 ENCOUNTER — Ambulatory Visit (HOSPITAL_COMMUNITY)
Admission: RE | Admit: 2014-08-09 | Discharge: 2014-08-09 | Disposition: A | Discharge status: Home / Self Care | Payer: Medicare Other | Source: Ambulatory Visit | Attending: Plastic Surgery | Admitting: Plastic Surgery

## 2014-08-09 DIAGNOSIS — Z79899 Other long term (current) drug therapy: Secondary | ICD-10-CM | POA: Insufficient documentation

## 2014-08-09 DIAGNOSIS — I481 Persistent atrial fibrillation: Secondary | ICD-10-CM | POA: Diagnosis not present

## 2014-08-09 DIAGNOSIS — S81802D Unspecified open wound, left lower leg, subsequent encounter: Secondary | ICD-10-CM

## 2014-08-09 DIAGNOSIS — E039 Hypothyroidism, unspecified: Secondary | ICD-10-CM | POA: Diagnosis not present

## 2014-08-09 DIAGNOSIS — K219 Gastro-esophageal reflux disease without esophagitis: Secondary | ICD-10-CM | POA: Diagnosis not present

## 2014-08-09 DIAGNOSIS — L97823 Non-pressure chronic ulcer of other part of left lower leg with necrosis of muscle: Secondary | ICD-10-CM | POA: Insufficient documentation

## 2014-08-09 DIAGNOSIS — I10 Essential (primary) hypertension: Secondary | ICD-10-CM | POA: Diagnosis not present

## 2014-08-09 DIAGNOSIS — M545 Low back pain: Secondary | ICD-10-CM | POA: Diagnosis not present

## 2014-08-09 DIAGNOSIS — T8183XA Persistent postprocedural fistula, initial encounter: Secondary | ICD-10-CM | POA: Diagnosis not present

## 2014-08-09 DIAGNOSIS — E785 Hyperlipidemia, unspecified: Secondary | ICD-10-CM | POA: Insufficient documentation

## 2014-08-09 DIAGNOSIS — L97829 Non-pressure chronic ulcer of other part of left lower leg with unspecified severity: Secondary | ICD-10-CM | POA: Diagnosis not present

## 2014-08-09 HISTORY — PX: I & D EXTREMITY: SHX5045

## 2014-08-09 HISTORY — PX: APPLICATION OF A-CELL OF EXTREMITY: SHX6303

## 2014-08-09 LAB — BASIC METABOLIC PANEL
Anion gap: 10 (ref 5–15)
BUN: 12 mg/dL (ref 6–20)
CO2: 24 mmol/L (ref 22–32)
Calcium: 8.9 mg/dL (ref 8.9–10.3)
Chloride: 107 mmol/L (ref 101–111)
Creatinine, Ser: 0.9 mg/dL (ref 0.44–1.00)
GFR calc Af Amer: >60 mL/min (ref >60)
GFR calc non Af Amer: 56 mL/min — ABNORMAL LOW (ref >60)
Glucose, Bld: 92 mg/dL (ref 65–99)
Potassium: 3.7 mmol/L (ref 3.5–5.1)
Sodium: 141 mmol/L (ref 135–145)

## 2014-08-09 LAB — CBC
HCT: 45.2 % (ref 36.0–46.0)
Hemoglobin: 14.9 g/dL (ref 12.0–15.0)
MCH: 33.3 pg (ref 26.0–34.0)
MCHC: 33 g/dL (ref 30.0–36.0)
MCV: 100.9 fL — ABNORMAL HIGH (ref 78.0–100.0)
Platelets: 205 10*3/uL (ref 150–400)
RBC: 4.48 MIL/uL (ref 3.87–5.11)
RDW: 13.9 % (ref 11.5–15.5)
WBC: 5.7 10*3/uL (ref 4.0–10.5)

## 2014-08-09 LAB — PROTIME-INR
INR: 2.21 — ABNORMAL HIGH (ref 0.00–1.49)
Prothrombin Time: 24.3 seconds — ABNORMAL HIGH (ref 11.6–15.2)

## 2014-08-09 SURGERY — IRRIGATION AND DEBRIDEMENT EXTREMITY
Anesthesia: General | Site: Leg Lower | Laterality: Left

## 2014-08-09 MED ORDER — PROPOFOL 10 MG/ML IV BOLUS
INTRAVENOUS | Status: DC | PRN
  Administered 2014-08-09: 140 mg via INTRAVENOUS

## 2014-08-09 MED ORDER — SODIUM CHLORIDE 0.9 % IR SOLN
Status: DC | PRN
  Administered 2014-08-09: 1000 mL

## 2014-08-09 MED ORDER — FENTANYL CITRATE (PF) 100 MCG/2ML IJ SOLN
INTRAMUSCULAR | Status: DC | PRN
  Administered 2014-08-09: 100 ug via INTRAVENOUS

## 2014-08-09 MED ORDER — FENTANYL CITRATE (PF) 250 MCG/5ML IJ SOLN
INTRAMUSCULAR | Status: AC
  Filled 2014-08-09: qty 5

## 2014-08-09 MED ORDER — LACTATED RINGERS IV SOLN
INTRAVENOUS | Status: DC
  Administered 2014-08-09: 12:00:00 via INTRAVENOUS

## 2014-08-09 MED ORDER — SODIUM CHLORIDE 0.9 % IR SOLN
Status: DC | PRN
  Administered 2014-08-09: 500 mL

## 2014-08-09 MED ORDER — ONDANSETRON HCL 4 MG/2ML IJ SOLN
INTRAMUSCULAR | Status: DC | PRN
  Administered 2014-08-09: 4 mg via INTRAVENOUS

## 2014-08-09 SURGICAL SUPPLY — 51 items
BAG DECANTER FOR FLEXI CONT (MISCELLANEOUS) ×2 IMPLANT
BANDAGE ELASTIC 4 VELCRO ST LF (GAUZE/BANDAGES/DRESSINGS) ×2 IMPLANT
BLADE SURG ROTATE 9660 (MISCELLANEOUS) IMPLANT
BNDG GAUZE ELAST 4 BULKY (GAUZE/BANDAGES/DRESSINGS) ×2 IMPLANT
CANISTER SUCTION 2500CC (MISCELLANEOUS) ×3 IMPLANT
CHLORAPREP W/TINT 26ML (MISCELLANEOUS) IMPLANT
CONT SPEC 4OZ CLIKSEAL STRL BL (MISCELLANEOUS) ×2 IMPLANT
COVER SURGICAL LIGHT HANDLE (MISCELLANEOUS) ×3 IMPLANT
DRAPE EXTREMITY T 121X128X90 (DRAPE) IMPLANT
DRAPE ORTHO SPLIT 77X108 STRL (DRAPES)
DRAPE SURG ORHT 6 SPLT 77X108 (DRAPES) IMPLANT
DRSG ADAPTIC 3X8 NADH LF (GAUZE/BANDAGES/DRESSINGS) IMPLANT
DRSG EMULSION OIL 3X3 NADH (GAUZE/BANDAGES/DRESSINGS) ×2 IMPLANT
DRSG PAD ABDOMINAL 8X10 ST (GAUZE/BANDAGES/DRESSINGS) ×2 IMPLANT
DRSG VAC ATS LRG SENSATRAC (GAUZE/BANDAGES/DRESSINGS) IMPLANT
DRSG VAC ATS MED SENSATRAC (GAUZE/BANDAGES/DRESSINGS) IMPLANT
DRSG VAC ATS SM SENSATRAC (GAUZE/BANDAGES/DRESSINGS) IMPLANT
ELECT CAUTERY BLADE 6.4 (BLADE) ×3 IMPLANT
ELECT REM PT RETURN 9FT ADLT (ELECTROSURGICAL) ×3
ELECTRODE REM PT RTRN 9FT ADLT (ELECTROSURGICAL) ×1 IMPLANT
GAUZE SPONGE 4X4 12PLY STRL (GAUZE/BANDAGES/DRESSINGS) IMPLANT
GLOVE BIO SURGEON STRL SZ 6.5 (GLOVE) ×3 IMPLANT
GLOVE BIO SURGEONS STRL SZ 6.5 (GLOVE) ×1
GOWN STRL REUS W/ TWL LRG LVL3 (GOWN DISPOSABLE) ×3 IMPLANT
GOWN STRL REUS W/TWL LRG LVL3 (GOWN DISPOSABLE) ×6
HANDPIECE INTERPULSE COAX TIP (DISPOSABLE)
KIT BASIN OR (CUSTOM PROCEDURE TRAY) ×3 IMPLANT
KIT ROOM TURNOVER OR (KITS) ×3 IMPLANT
MATRIX SURGICAL PSM 7X10CM (Tissue) ×2 IMPLANT
MICROMATRIX 500MG (Tissue) ×3 IMPLANT
NS IRRIG 1000ML POUR BTL (IV SOLUTION) ×3 IMPLANT
PACK GENERAL/GYN (CUSTOM PROCEDURE TRAY) ×3 IMPLANT
PACK ORTHO EXTREMITY (CUSTOM PROCEDURE TRAY) ×3 IMPLANT
PAD ABD 8X10 STRL (GAUZE/BANDAGES/DRESSINGS) IMPLANT
PAD ARMBOARD 7.5X6 YLW CONV (MISCELLANEOUS) ×6 IMPLANT
PAD NEG PRESSURE SENSATRAC (MISCELLANEOUS) IMPLANT
SET HNDPC FAN SPRY TIP SCT (DISPOSABLE) IMPLANT
SOLUTION PARTIC MCRMTRX 500MG (Tissue) IMPLANT
SPONGE GAUZE 4X4 12PLY STER LF (GAUZE/BANDAGES/DRESSINGS) ×2 IMPLANT
STOCKINETTE IMPERVIOUS 9X36 MD (GAUZE/BANDAGES/DRESSINGS) IMPLANT
STOCKINETTE IMPERVIOUS LG (DRAPES) IMPLANT
SUT VIC AB 5-0 PS2 18 (SUTURE) ×2 IMPLANT
SWAB COLLECTION DEVICE MRSA (MISCELLANEOUS) ×2 IMPLANT
TOWEL OR 17X24 6PK STRL BLUE (TOWEL DISPOSABLE) ×3 IMPLANT
TOWEL OR 17X26 10 PK STRL BLUE (TOWEL DISPOSABLE) ×3 IMPLANT
TUBE ANAEROBIC SPECIMEN COL (MISCELLANEOUS) ×2 IMPLANT
TUBE CONNECTING 12'X1/4 (SUCTIONS) ×1
TUBE CONNECTING 12X1/4 (SUCTIONS) ×2 IMPLANT
UNDERPAD 30X30 INCONTINENT (UNDERPADS AND DIAPERS) ×3 IMPLANT
WATER STERILE IRR 1000ML POUR (IV SOLUTION) IMPLANT
YANKAUER SUCT BULB TIP NO VENT (SUCTIONS) ×3 IMPLANT

## 2014-08-09 NOTE — Op Note (Signed)
Operative Note   DATE OF OPERATION: 08/09/2014  LOCATION: [facility]   SURGICAL DIVISION: Plastic Surgery  PREOPERATIVE DIAGNOSES:  Left leg ulcer 1 cm  POSTOPERATIVE DIAGNOSES:  same  PROCEDURE:  Preparation of left leg ulcer with placement of Acell (powder 500 mg and sheet 7 x 10 cm)  SURGEON: Theodoro Kos, DO  ASSISTANT: Shawn Rayburn, PA  ANESTHESIA:  General.   COMPLICATIONS: None.   INDICATIONS FOR PROCEDURE:  The patient, [name] is a 80 y.o. female born on Nov 21, 1925, is here for treatment of left leg ulcer originally from trauma and then a non healing fistula. MRN: [id number]  CONSENT:  Informed consent was obtained directly from the patient. Risks, benefits and alternatives were fully discussed. Specific risks including but not limited to bleeding, infection, hematoma, seroma, scarring, pain, infection, contracture, asymmetry, wound healing problems, and need for further surgery were all discussed. The patient did have an ample opportunity to have questions answered to satisfaction.   DESCRIPTION OF PROCEDURE:  The patient was taken to the operating room. SCDs were placed and IV antibiotics were given. The patient's operative site was prepped and draped in a sterile fashion. A time out was performed and all information was confirmed to be correct.  General anesthesia was administered.  The leg was irrigated with antibiotic solution and saline.  The currette and #15 blade were used to debride to bone 1 x 2 x 3 cm.  The bovie was used to obtain hemostasis.  The Acell powder was then packed into the defect.  All of it was utilized.  The sheet was cut and used to cover and pack the area with 4 x 4 cm utilized.  Adaptic was applied with KY gel and gauze.  The leg was wrapped with kerlex and an Ace wrap.  The patient tolerated the procedure well.  There were no complications. The patient was allowed to wake from anesthesia, extubated and taken to the recovery  room in satisfactory condition.

## 2014-08-09 NOTE — Anesthesia Procedure Notes (Signed)
Procedure Name: LMA Insertion Date/Time: 08/09/2014 1:07 PM Performed by: Shirlyn Goltz Pre-anesthesia Checklist: Patient identified, Emergency Drugs available, Suction available and Patient being monitored Patient Re-evaluated:Patient Re-evaluated prior to inductionOxygen Delivery Method: Circle system utilized Preoxygenation: Pre-oxygenation with 100% oxygen Intubation Type: IV induction Ventilation: Mask ventilation without difficulty LMA: LMA inserted LMA Size: 4.0 Number of attempts: 1 Placement Confirmation: positive ETCO2 and breath sounds checked- equal and bilateral Tube secured with: Tape Dental Injury: Teeth and Oropharynx as per pre-operative assessment

## 2014-08-09 NOTE — Anesthesia Preprocedure Evaluation (Signed)
Anesthesia Evaluation  Patient identified by MRN, date of birth, ID band Patient awake    Reviewed: Allergy & Precautions, NPO status , Patient's Chart, lab work & pertinent test results  Airway Mallampati: II  TM Distance: >3 FB Neck ROM: Full    Dental no notable dental hx.    Pulmonary neg pulmonary ROS,  breath sounds clear to auscultation  Pulmonary exam normal       Cardiovascular hypertension, Pt. on medications + Peripheral Vascular Disease Normal cardiovascular exam+ dysrhythmias Atrial Fibrillation Rhythm:Regular Rate:Normal     Neuro/Psych negative neurological ROS  negative psych ROS   GI/Hepatic Neg liver ROS, GERD-  ,  Endo/Other  Hypothyroidism   Renal/GU negative Renal ROS  negative genitourinary   Musculoskeletal negative musculoskeletal ROS (+)   Abdominal   Peds negative pediatric ROS (+)  Hematology negative hematology ROS (+)   Anesthesia Other Findings   Reproductive/Obstetrics negative OB ROS                             Anesthesia Physical Anesthesia Plan  ASA: III  Anesthesia Plan: General   Post-op Pain Management:    Induction: Intravenous  Airway Management Planned: LMA  Additional Equipment:   Intra-op Plan:   Post-operative Plan: Extubation in OR  Informed Consent: I have reviewed the patients History and Physical, chart, labs and discussed the procedure including the risks, benefits and alternatives for the proposed anesthesia with the patient or authorized representative who has indicated his/her understanding and acceptance.   Dental advisory given  Plan Discussed with: CRNA and Surgeon  Anesthesia Plan Comments:         Anesthesia Quick Evaluation

## 2014-08-09 NOTE — Transfer of Care (Signed)
Immediate Anesthesia Transfer of Care Note  Patient: [name]  Procedure(s) Performed: Procedure(s): IRRIGATION AND DEBRIDEMENT  OF LEFT LOWER LEG (Left) PLACEMENT OF A CELL (Left)  Patient Location: PACU  Anesthesia Type:General  Level of Consciousness: awake, alert , oriented and patient cooperative  Airway & Oxygen Therapy: Patient Spontanous Breathing and Patient connected to face mask oxygen  Post-op Assessment: Report given to RN, Post -op Vital signs reviewed and stable, Patient moving all extremities and Patient moving all extremities X 4  Post vital signs: Reviewed and stable  Last Vitals:  Filed Vitals:   08/09/14 1345  BP: 140/80  Pulse: 70  Temp: 36.6 C  Resp: 11    Complications: No apparent anesthesia complications

## 2014-08-09 NOTE — Discharge Instructions (Signed)
Apply KY gel to the left leg daily

## 2014-08-09 NOTE — Brief Op Note (Signed)
08/09/2014  1:44 PM  PATIENT:  [name]  81 y.o. female  PRE-OPERATIVE DIAGNOSIS:  LEFT LOWER LEG ULCER  POST-OPERATIVE DIAGNOSIS:  LEFT LOWER LEG ULCER  PROCEDURE:  Procedure(s): IRRIGATION AND DEBRIDEMENT  OF LEFT LOWER LEG (Left) PLACEMENT OF A CELL (Left)  SURGEON:  Surgeon(s) and Role:    * Claire Sanger, DO - Primary  ASSISTANTS: none   ANESTHESIA:   general  EBL:     BLOOD ADMINISTERED:none  DRAINS: none   LOCAL MEDICATIONS USED:  NONE  SPECIMEN:  Left leg swab deep  DISPOSITION OF SPECIMEN:  micro  COUNTS:  YES  TOURNIQUET:  * No tourniquets in log *  DICTATION: .Dragon Dictation  PLAN OF CARE: Discharge to home after PACU  PATIENT DISPOSITION:  PACU - hemodynamically stable.   Delay start of Pharmacological VTE agent (>24hrs) due to surgical blood loss or risk of bleeding: no

## 2014-08-09 NOTE — H&P (Addendum)
[name] is an 81 y.o. female.   Chief Complaint: left leg wound HPI: The patient is an 81 yrs old wf here for further treatment of her left leg wound.  Several months ago she had a fall and sustained an open fracture of the distal left leg with significant soft tissue injury.  She underwent hardware placement and prolonged healing of the area.  She made remarkable improvements in most of the wound but one area where a fistula formed from the hardware to the skin.  She had debridement several times with acell placement and progress.  The hardware has been removed so we return to the OR for further treatment.  Past Medical History  Diagnosis Date  . Hypertension   . LBP (low back pain)     severe lumbar spondylosis s/p L3/L4,L4/L5 fusion 12/10  . Osteopenia   . Hypothyroidism   . Chronic constipation   . History of colitis     DX  2003  WITH ISCHEMIC COLITIS  . History of GI bleed     2011--- UPPER SECONARY GASTRIC ULCER FROM NSAID USE  . History of Helicobacter pylori infection     2011  . GERD (gastroesophageal reflux disease)   . H/O hiatal hernia   . Diverticulosis of colon   . History of ulcer of lower limb     2012   RIGHT LOWER EXTREMITIY--  STATSIS ULCER  . Bilateral edema of lower extremity   . Persistent atrial fibrillation     CARDIOLOGIST-   DR Burman Foster  . Lumbar spondylolysis   . Anticoagulated on Coumadin   . Hyperlipidemia   . Open wound of left lower leg   . Iron deficiency anemia 2011    elevated MCV  . OA (osteoarthritis)     "hands" (08/25/2013)  . History of diverticulitis of colon     perferated diverticulitis w/ abscess 08-25-2013 drain placed --  resolved without surgical intervention  . History of cellulitis     LEFT LOWER LEG --  SEPT 2015  . RBBB     at fib  . Anxiety     denies  . OAB (overactive bladder)     Past Surgical History  Procedure Laterality Date  . Shoulder open rotator cuff repair Left 1997  . Total hip arthroplasty Right  06-07-2010  . External fixation leg Left 06/16/2013    Procedure: EXTERNAL FIXATION LEG;  Surgeon: Meredith Pel, MD;  Location: [facility];  Service: Orthopedics;  Laterality: Left;  . I&d extremity Left 06/16/2013    Procedure: IRRIGATION AND DEBRIDEMENT EXTREMITY;  Surgeon: Meredith Pel, MD;  Location: [facility];  Service: Orthopedics;  Laterality: Left;  . Application of wound vac Left 06/16/2013    Procedure: APPLICATION OF WOUND VAC;  Surgeon: Meredith Pel, MD;  Location: [facility];  Service: Orthopedics;  Laterality: Left;  . Tibia im nail insertion Left 06/21/2013    Procedure: INTRAMEDULLARY (IM) NAIL TIBIAL;  Surgeon: Meredith Pel, MD;  Location: [facility];  Service: Orthopedics;  Laterality: Left;  . External fixation removal Left 06/21/2013    Procedure: REMOVAL EXTERNAL FIXATION LEG;  Surgeon: Meredith Pel, MD;  Location: [facility];  Service: Orthopedics;  Laterality: Left;  . Application of a-cell of extremity Left 06/21/2013    Procedure: APPLICATION OF A-CELL OF EXTREMITY;  Surgeon: Theodoro Kos, DO;  Location: [facility];  Service: Plastics;  Laterality: Left;  . Application of wound vac Left 06/21/2013    Procedure:  APPLICATION OF WOUND VAC;  Surgeon: Theodoro Kos, DO;  Location: [facility];  Service: Plastics;  Laterality: Left;  . Lumbar fusion  01-02-2009    L3-L5  . Transthoracic echocardiogram  09-11-2010   DR MCALHANY    LVSF  55-60%/  MILD MV CALCIFICATION WITH NO STENOSIS/  MILD MR & TR / MODERATE LAE/  MODERATE TO SEVERE RAE  . Tonsillectomy  AS CHILD  . Application of a-cell of extremity Left 08/22/2013    Procedure: APPLICATION OF A-CELL/VAC TO LOWER LEFT LEG WOUND;  Surgeon: Theodoro Kos, DO;  Location: [facility];  Service: Plastics;  Laterality: Left;  . Upper gi endoscopy  03-29-2010  . I&d extremity Left 02/06/2014    Procedure: IRRIGATION AND DEBRIDEMENT LEFT LEG WOUND ;  Surgeon: Theodoro Kos, DO;  Location: [facility];  Service:  Plastics;  Laterality: Left;  . Application of a-cell of extremity Left 02/06/2014    Procedure: WITH PLACEMENT OF A -CELL ;  Surgeon: Theodoro Kos, DO;  Location: [facility];  Service: Plastics;  Laterality: Left;  . Hardware removal Left 05/23/2014    Procedure: HARDWARE REMOVAL;  Surgeon: Meredith Pel, MD;  Location: [facility];  Service: Orthopedics;  Laterality: Left;  REMOVAL OF HARDWARE LEFT TIBIA    Family History  Problem Relation Age of Onset  . Cancer Father   . Hypertension Other    Social History:  reports that she has never smoked. She has never used smokeless tobacco. She reports that she does not drink alcohol or use illicit drugs.  Allergies:  Allergies  Allergen Reactions  . Atenolol Nausea And Vomiting  . Clarithromycin Nausea And Vomiting  . Codeine Sulfate Nausea Only  . Macrodantin Other (See Comments)    Long time ago - reaction unknown  . Percocet [Oxycodone-Acetaminophen] Nausea And Vomiting  . Zosyn [Piperacillin Sod-Tazobactam So] Rash    Morbilliform eruption with itching    Medications Prior to Admission  Medication Sig Dispense Refill  . acetaminophen (TYLENOL) 500 MG tablet Take 500 mg by mouth daily as needed (pain).     [name] atorvastatin (LIPITOR) 10 MG tablet Take 1 tablet (10 mg total) by mouth every evening. 90 tablet 3  . B Complex-C (B-COMPLEX WITH VITAMIN C) tablet Take 1 tablet by mouth daily.    . busPIRone (BUSPAR) 7.5 MG tablet Take 1 tablet (7.5 mg total) by mouth 2 (two) times daily. 180 tablet 3  . Cholecalciferol (VITAMIN D3) 1000 UNITS tablet Take 1,000 Units by mouth daily.      [name] diltiazem (CARDIZEM CD) 120 MG 24 hr capsule TAKE 1 CAPSULE (120 MG TOTAL) BY MOUTH DAILY. 90 capsule 1  . Ferrous Sulfate (IRON) 325 (65 FE) MG TABS Take 1 tablet by mouth daily.    . fluocinonide cream (LIDEX) 1.61 % Apply 1 application topically 2 (two) times daily as needed (fluid retention).     . furosemide (LASIX) 40 MG tablet 1 po on  Mon-Wed-Fri in am (Patient taking differently: Take 40 mg by mouth as directed. 1 po on Mon-Wed-Fri in am) 30 tablet 5  . KLOR-CON M10 10 MEQ tablet Take 10 mEq by mouth daily.  3  . levothyroxine (SYNTHROID, LEVOTHROID) 25 MCG tablet TAKE 1 TABLET BY MOUTH ONCE DAILY FOR THYROID 90 tablet 3  . metoprolol succinate (TOPROL-XL) 50 MG 24 hr tablet TAKE 1 TABLET (50 MG TOTAL) BY MOUTH 2 (TWO) TIMES DAILY. TAKE WITH OR IMMEDIATELY FOLLOWING A MEAL. [place]  tablet 3  . mirabegron ER (MYRBETRIQ) 50 MG TB24 tablet Take 1 tablet (50 mg total) by mouth daily. 30 tablet 11  . Multiple Vitamins-Minerals (ZINC PO) Take 1 tablet by mouth daily.    . polyethylene glycol (MIRALAX / GLYCOLAX) packet Take 17 g by mouth daily as needed for moderate constipation.     . temazepam (RESTORIL) 30 MG capsule TAKE ONE CAPSULE BY MOUTH AT BEDTIME 30 capsule 5  . vitamin C (ASCORBIC ACID) 500 MG tablet Take 500 mg by mouth daily.    [name] warfarin (COUMADIN) 5 MG tablet Take 2.5-5 mg by mouth daily. 2.5 mg daily except 5 mg on Mon and Fri    . pantoprazole (PROTONIX) 40 MG tablet Take 1 tablet (40 mg total) by mouth daily before breakfast. (Patient not taking: Reported on 07/05/2014) 90 tablet 3    No results found for this or any previous visit (from the past 48 hour(s)). No results found.  Review of Systems  Constitutional: Negative.   HENT: Negative.   Eyes: Negative.   Respiratory: Negative.   Cardiovascular: Negative.   Gastrointestinal: Negative.   Genitourinary: Negative.   Musculoskeletal: Positive for joint pain.  Skin: Negative.   Neurological: Negative.   Psychiatric/Behavioral: Negative.     Blood pressure 189/94, pulse 71, temperature 97.7 F (36.5 C), temperature source Oral, resp. rate 20, height 5\' 3"  (1.6 m), weight 84.908 kg (187 lb 3 oz), SpO2 98 %. Physical Exam  Constitutional: She is oriented to person, place, and time. She appears well-developed and well-nourished.  HENT:  Head: Normocephalic  and atraumatic.  Eyes: Conjunctivae and EOM are normal. Pupils are equal, round, and reactive to light.  Cardiovascular: Normal rate.   Respiratory: Effort normal.  GI: Soft.  Musculoskeletal:       Legs: Neurological: She is alert and oriented to person, place, and time.  Psychiatric: She has a normal mood and affect. Her behavior is normal. Judgment and thought content normal.     Assessment/Plan Plan for debridement and placement of Acell to left leg wound.  SANGER,Fatima Fedie 08/09/2014, 12:05 PM

## 2014-08-09 NOTE — Anesthesia Postprocedure Evaluation (Signed)
Anesthesia Post Note  Patient: [name]  Procedure(s) Performed: Procedure(s) (LRB): IRRIGATION AND DEBRIDEMENT  OF LEFT LOWER LEG (Left) PLACEMENT OF A CELL (Left)  Anesthesia type: general  Patient location: PACU  Post pain: Pain level controlled  Post assessment: Patient's Cardiovascular Status Stable  Last Vitals:  Filed Vitals:   08/09/14 1429  BP: 152/76  Pulse: 64  Temp: 36.5 C  Resp: 20    Post vital signs: Reviewed and stable  Level of consciousness: sedated  Complications: No apparent anesthesia complications

## 2014-08-10 ENCOUNTER — Encounter (HOSPITAL_COMMUNITY): Payer: Self-pay | Admitting: Plastic Surgery

## 2014-08-13 LAB — WOUND CULTURE: Gram Stain: NONE SEEN

## 2014-08-14 LAB — ANAEROBIC CULTURE: Gram Stain: NONE SEEN

## 2014-08-16 ENCOUNTER — Ambulatory Visit (INDEPENDENT_AMBULATORY_CARE_PROVIDER_SITE_OTHER): Payer: Medicare Other | Admitting: *Deleted

## 2014-08-16 DIAGNOSIS — Z5181 Encounter for therapeutic drug level monitoring: Secondary | ICD-10-CM | POA: Diagnosis not present

## 2014-08-16 DIAGNOSIS — I4892 Unspecified atrial flutter: Secondary | ICD-10-CM | POA: Diagnosis not present

## 2014-08-16 LAB — POCT INR: INR: 1.8

## 2014-08-21 ENCOUNTER — Encounter (HOSPITAL_BASED_OUTPATIENT_CLINIC_OR_DEPARTMENT_OTHER): Payer: Medicare Other | Attending: Plastic Surgery

## 2014-08-21 DIAGNOSIS — T8189XA Other complications of procedures, not elsewhere classified, initial encounter: Secondary | ICD-10-CM | POA: Insufficient documentation

## 2014-08-21 DIAGNOSIS — X58XXXA Exposure to other specified factors, initial encounter: Secondary | ICD-10-CM | POA: Diagnosis not present

## 2014-08-21 DIAGNOSIS — S81802A Unspecified open wound, left lower leg, initial encounter: Secondary | ICD-10-CM | POA: Insufficient documentation

## 2014-08-23 ENCOUNTER — Ambulatory Visit (INDEPENDENT_AMBULATORY_CARE_PROVIDER_SITE_OTHER): Payer: Medicare Other | Admitting: Pharmacist

## 2014-08-23 ENCOUNTER — Encounter: Payer: Self-pay | Admitting: Cardiovascular Disease

## 2014-08-23 ENCOUNTER — Ambulatory Visit (INDEPENDENT_AMBULATORY_CARE_PROVIDER_SITE_OTHER): Payer: Medicare Other | Admitting: Cardiovascular Disease

## 2014-08-23 VITALS — BP 150/80 | HR 77 | Ht 63.0 in | Wt 185.0 lb

## 2014-08-23 DIAGNOSIS — I4819 Other persistent atrial fibrillation: Secondary | ICD-10-CM

## 2014-08-23 DIAGNOSIS — I481 Persistent atrial fibrillation: Secondary | ICD-10-CM | POA: Diagnosis not present

## 2014-08-23 DIAGNOSIS — Z5181 Encounter for therapeutic drug level monitoring: Secondary | ICD-10-CM

## 2014-08-23 DIAGNOSIS — I10 Essential (primary) hypertension: Secondary | ICD-10-CM | POA: Diagnosis not present

## 2014-08-23 DIAGNOSIS — I4892 Unspecified atrial flutter: Secondary | ICD-10-CM

## 2014-08-23 LAB — POCT INR: INR: 2

## 2014-08-23 NOTE — Progress Notes (Signed)
Chief Complaint  Patient presents with  . Constipation      History of Present Illness: 81 yo WF with history of HTN, chronic low back pain, iron deficiency anemia and paroxysmal atrial fibrillation/flutter who is here today for follow up. She has had chronic atrial flutter/fibrillation over the past 3 years and has been managed on coumadin and rate control agents. In 2011 she had dark stools with anemia. Coumadin was held and upper endoscopy showed several small clean based gastric ulcers. She was admitted October 2011 and found to be dehydrated with acute renal failure. She was hydrated and this resolved. No further evidence of GI bleeding. She had several episodes of RVR with her atrial fib in the hospital. Coumadin has been restarted without evidence of further GI bleeding.  She had her right hip replaced per Dr. Rich Brave in May 2012. She has had bilateral lower ext swelling since her hip replacement in May 2012. She was started on lasix 40 mg po BID. This seemed to help the swelling. Venous dopplers negative for DVT. Fall June 2015 which led to an open fracture of the left leg tibia and fibula requiring surgical intervention and wound care with wound VAC. Patient was at [facility] when she developed LLQ pain. CT scan of the abdomen showed inflammatory changes in the pelvis and right lower quadrant with wall thickening of small bowel adjacent loops concerning for ruptured diverticulitis and abscess. General Surgery was consulted and she was started on IV Cipro and Flagyl. She had a drain placed transgluteally to drain a pelvic abscess by IR which was removed on 09/06/13. She has had continued issues with healing of her leg wound.   She is here today for follow up. She has not been aware of any tachycardia or skipped beats. No chest pain or SOB. Her only complaint is her left leg which is not healing. No bleeding issues.   Primary Care Physician: [name]   Past Medical History  Diagnosis  Date  . Hypertension   . LBP (low back pain)     severe lumbar spondylosis s/p L3/L4,L4/L5 fusion 12/10  . Osteopenia   . Hypothyroidism   . Chronic constipation   . History of colitis     DX  2003  WITH ISCHEMIC COLITIS  . History of GI bleed     2011--- UPPER SECONARY GASTRIC ULCER FROM NSAID USE  . History of Helicobacter pylori infection     2011  . GERD (gastroesophageal reflux disease)   . H/O hiatal hernia   . Diverticulosis of colon   . History of ulcer of lower limb     2012   RIGHT LOWER EXTREMITIY--  STATSIS ULCER  . Bilateral edema of lower extremity   . Persistent atrial fibrillation     CARDIOLOGIST-   DR Burman Foster  . Lumbar spondylolysis   . Anticoagulated on Coumadin   . Hyperlipidemia   . Open wound of left lower leg   . Iron deficiency anemia 2011    elevated MCV  . OA (osteoarthritis)     "hands" (08/25/2013)  . History of diverticulitis of colon     perferated diverticulitis w/ abscess 08-25-2013 drain placed --  resolved without surgical intervention  . History of cellulitis     LEFT LOWER LEG --  SEPT 2015  . RBBB     at fib  . Anxiety     denies  . OAB (overactive bladder)     Past Surgical History  Procedure  Laterality Date  . Shoulder open rotator cuff repair Left 1997  . Total hip arthroplasty Right 06-07-2010  . External fixation leg Left 06/16/2013    Procedure: EXTERNAL FIXATION LEG;  Surgeon: Meredith Pel, MD;  Location: [place];  Service: Orthopedics;  Laterality: Left;  . I&d extremity Left 06/16/2013    Procedure: IRRIGATION AND DEBRIDEMENT EXTREMITY;  Surgeon: Meredith Pel, MD;  Location: [place];  Service: Orthopedics;  Laterality: Left;  . Application of wound vac Left 06/16/2013    Procedure: APPLICATION OF WOUND VAC;  Surgeon: Meredith Pel, MD;  Location: [place];  Service: Orthopedics;  Laterality: Left;  . Tibia im nail insertion Left 06/21/2013    Procedure: INTRAMEDULLARY (IM) NAIL TIBIAL;  Surgeon: Meredith Pel,  MD;  Location: [place];  Service: Orthopedics;  Laterality: Left;  . External fixation removal Left 06/21/2013    Procedure: REMOVAL EXTERNAL FIXATION LEG;  Surgeon: Meredith Pel, MD;  Location: [place];  Service: Orthopedics;  Laterality: Left;  . Application of a-cell of extremity Left 06/21/2013    Procedure: APPLICATION OF A-CELL OF EXTREMITY;  Surgeon: Theodoro Kos, DO;  Location: [place];  Service: Plastics;  Laterality: Left;  . Application of wound vac Left 06/21/2013    Procedure: APPLICATION OF WOUND VAC;  Surgeon: Theodoro Kos, DO;  Location: [place];  Service: Plastics;  Laterality: Left;  . Lumbar fusion  01-02-2009    L3-L5  . Transthoracic echocardiogram  09-11-2010   DR Jacorey Donaway    LVSF  55-60%/  MILD MV CALCIFICATION WITH NO STENOSIS/  MILD MR & TR / MODERATE LAE/  MODERATE TO SEVERE RAE  . Tonsillectomy  AS CHILD  . Application of a-cell of extremity Left 08/22/2013    Procedure: APPLICATION OF A-CELL/VAC TO LOWER LEFT LEG WOUND;  Surgeon: Theodoro Kos, DO;  Location: [place];  Service: Plastics;  Laterality: Left;  . Upper gi endoscopy  03-29-2010  . I&d extremity Left 02/06/2014    Procedure: IRRIGATION AND DEBRIDEMENT LEFT LEG WOUND ;  Surgeon: Theodoro Kos, DO;  Location: [place];  Service: Plastics;  Laterality: Left;  . Application of a-cell of extremity Left 02/06/2014    Procedure: WITH PLACEMENT OF A -CELL ;  Surgeon: Theodoro Kos, DO;  Location: [place];  Service: Plastics;  Laterality: Left;  . Hardware removal Left 05/23/2014    Procedure: HARDWARE REMOVAL;  Surgeon: Meredith Pel, MD;  Location: [place];  Service: Orthopedics;  Laterality: Left;  REMOVAL OF HARDWARE LEFT TIBIA  . I&d extremity Left 08/09/2014    Procedure: IRRIGATION AND DEBRIDEMENT  OF LEFT LOWER LEG;  Surgeon: Theodoro Kos, DO;  Location: [place];  Service: Plastics;  Laterality: Left;  . Application of a-cell of extremity Left 08/09/2014     Procedure: PLACEMENT OF A CELL;  Surgeon: Theodoro Kos, DO;  Location: [place];  Service: Plastics;  Laterality: Left;    Current Outpatient Prescriptions  Medication Sig Dispense Refill  . acetaminophen (TYLENOL) 500 MG tablet Take 500 mg by mouth daily as needed (pain).     [name] atorvastatin (LIPITOR) 10 MG tablet Take 1 tablet (10 mg total) by mouth every evening. 90 tablet 3  . B Complex-C (B-COMPLEX WITH VITAMIN C) tablet Take 1 tablet by mouth daily.    . busPIRone (BUSPAR) 7.5 MG tablet Take 1 tablet (7.5 mg total) by mouth 2 (two) times daily. 180 tablet 3  . Cholecalciferol (VITAMIN D3) 1000  UNITS tablet Take 1,000 Units by mouth daily.      [name] diltiazem (CARDIZEM CD) 120 MG 24 hr capsule TAKE 1 CAPSULE (120 MG TOTAL) BY MOUTH DAILY. 90 capsule 1  . Ferrous Sulfate (IRON) 325 (65 FE) MG TABS Take 1 tablet by mouth daily.    . fluocinonide cream (LIDEX) 5.63 % Apply 1 application topically 2 (two) times daily as needed (fluid retention).     . furosemide (LASIX) 40 MG tablet 1 po on Mon-Wed-Fri in am (Patient taking differently: Take 40 mg by mouth as directed. 1 po on Mon-Wed-Fri in am) 30 tablet 5  . KLOR-CON M10 10 MEQ tablet Take 10 mEq by mouth daily.  3  . levothyroxine (SYNTHROID, LEVOTHROID) 25 MCG tablet TAKE 1 TABLET BY MOUTH ONCE DAILY FOR THYROID 90 tablet 3  . metoprolol succinate (TOPROL-XL) 50 MG 24 hr tablet TAKE 1 TABLET (50 MG TOTAL) BY MOUTH 2 (TWO) TIMES DAILY. TAKE WITH OR IMMEDIATELY FOLLOWING A MEAL. 180 tablet 3  . mirabegron ER (MYRBETRIQ) 50 MG TB24 tablet Take 1 tablet (50 mg total) by mouth daily. 30 tablet 11  . Multiple Vitamins-Minerals (ZINC PO) Take 1 tablet by mouth daily.    . mupirocin ointment (BACTROBAN) 2 %   0  . polyethylene glycol (MIRALAX / GLYCOLAX) packet Take 17 g by mouth daily as needed for moderate constipation.     . temazepam (RESTORIL) 30 MG capsule TAKE ONE CAPSULE BY MOUTH AT BEDTIME 30 capsule 5  . vitamin C (ASCORBIC ACID) 500 MG tablet  Take 500 mg by mouth daily.    [name] warfarin (COUMADIN) 5 MG tablet Take 2.5-5 mg by mouth daily. 2.5 mg daily except 5 mg on Mon and Fri     No current facility-administered medications for this visit.    Allergies  Allergen Reactions  . Atenolol Nausea And Vomiting  . Clarithromycin Nausea And Vomiting  . Codeine Sulfate Nausea Only  . Macrodantin Other (See Comments)    Long time ago - reaction unknown  . Percocet [Oxycodone-Acetaminophen] Nausea And Vomiting  . Zosyn [Piperacillin Sod-Tazobactam So] Rash    Morbilliform eruption with itching    History   Social History  . Marital Status: Married    Spouse Name: N/A  . Number of Children: 2  . Years of Education: N/A   Occupational History  . retired Unemployed   Social History Main Topics  . Smoking status: Never Smoker   . Smokeless tobacco: Never Used  . Alcohol Use: No     Comment: 1 glass occ wine 08/03/14- none in a year  . Drug Use: No  . Sexual Activity: Not on file   Other Topics Concern  . Not on file   Social History Narrative    Family History  Problem Relation Age of Onset  . Cancer Father   . Hypertension Other     Review of Systems:  As stated in the HPI and otherwise negative.   BP 150/80 mmHg  Pulse 77  Ht 5\' 3"  (1.6 m)  Wt 185 lb (83.915 kg)  BMI 32.78 kg/m2  SpO2 97%  Physical Examination: General: Well developed, well nourished, NAD HEENT: OP clear, mucus membranes moist SKIN: warm, dry. No rashes. Neuro: No focal deficits Musculoskeletal: Muscle strength 5/5 all ext Psychiatric: Mood and affect normal Neck: No JVD, no carotid bruits, no thyromegaly, no lymphadenopathy. Lungs:Clear bilaterally, no wheezes, rhonci, crackles Cardiovascular: Irregular  rate and rhythm. No murmurs, gallops or rubs. Abdomen:Soft.  Bowel sounds present. Non-tender.  Extremities: No lower extremity edema. Pulses are 2 + in the bilateral DP/PT. Chronic venous stasis changes.   EKG:  EKG is not ordered  today. The ekg ordered today demonstrates   Recent Labs: 09/08/2013: Magnesium 1.8 01/17/2014: ALT 19; TSH 0.51 08/09/2014: BUN 12; Creatinine, Ser 0.90; Hemoglobin 14.9; Platelets 205; Potassium 3.7; Sodium 141   Lipid Panel    Component Value Date/Time   CHOL 116 06/25/2012 1619   TRIG 82 09/05/2013 0300   HDL 41.50 06/25/2012 1619   CHOLHDL 3 06/25/2012 1619   VLDL 8.2 06/25/2012 1619   LDLCALC 66 06/25/2012 1619   LDLDIRECT 110.3 05/24/2008 1041     Wt Readings from Last 3 Encounters:  08/23/14 185 lb (83.915 kg)  08/09/14 187 lb 3 oz (84.908 kg)  08/03/14 187 lb 3.2 oz (84.913 kg)     Other studies Reviewed: Additional studies/ records that were reviewed today include: . Review of the above records demonstrates:    Assessment and Plan:   1. Atrial fibrillation: Chronic persistent, rate controlled. She is anti-coagulated with coumadin with no bleeding issues. Continue beta blocker and Cardizem.   2. HTN: BP slightly elevated but she does not wish to change medications. No changes today.  Current medicines are reviewed at length with the patient today.  The patient does not have concerns regarding medicines.  The following changes have been made:  no change  Labs/ tests ordered today include:  No orders of the defined types were placed in this encounter.    Disposition:   FU with me in 6 months  Signed, Lauree Chandler, MD 08/23/2014 3:25 PM    [facility] [address], [place], [place]  [address] Phone: [phone]; Fax: [phone]

## 2014-08-23 NOTE — Patient Instructions (Signed)
Medication Instructions:  Your physician recommends that you continue on your current medications as directed. Please refer to the Current Medication list given to you today.   Labwork: none  Testing/Procedures: None   Follow-Up: Your physician wants you to follow-up in: 6 months.  You will receive a reminder letter in the mail two months in advance. If you don't receive a letter, please call our office to schedule the follow-up appointment.   Any Other Special Instructions Will Be Listed Below (If Applicable).

## 2014-09-04 DIAGNOSIS — L03116 Cellulitis of left lower limb: Secondary | ICD-10-CM | POA: Diagnosis not present

## 2014-09-04 DIAGNOSIS — S82202D Unspecified fracture of shaft of left tibia, subsequent encounter for closed fracture with routine healing: Secondary | ICD-10-CM | POA: Diagnosis not present

## 2014-09-06 ENCOUNTER — Ambulatory Visit (INDEPENDENT_AMBULATORY_CARE_PROVIDER_SITE_OTHER): Payer: Medicare Other | Admitting: *Deleted

## 2014-09-06 DIAGNOSIS — I4892 Unspecified atrial flutter: Secondary | ICD-10-CM | POA: Diagnosis not present

## 2014-09-06 DIAGNOSIS — Z5181 Encounter for therapeutic drug level monitoring: Secondary | ICD-10-CM | POA: Diagnosis not present

## 2014-09-06 LAB — POCT INR: INR: 2.2

## 2014-09-18 DIAGNOSIS — S81802A Unspecified open wound, left lower leg, initial encounter: Secondary | ICD-10-CM | POA: Diagnosis not present

## 2014-09-18 DIAGNOSIS — T8189XA Other complications of procedures, not elsewhere classified, initial encounter: Secondary | ICD-10-CM | POA: Diagnosis not present

## 2014-09-27 ENCOUNTER — Ambulatory Visit (INDEPENDENT_AMBULATORY_CARE_PROVIDER_SITE_OTHER): Payer: Medicare Other | Admitting: *Deleted

## 2014-09-27 DIAGNOSIS — Z5181 Encounter for therapeutic drug level monitoring: Secondary | ICD-10-CM | POA: Diagnosis not present

## 2014-09-27 DIAGNOSIS — I4892 Unspecified atrial flutter: Secondary | ICD-10-CM

## 2014-09-27 LAB — POCT INR: INR: 2.9

## 2014-09-28 ENCOUNTER — Other Ambulatory Visit: Payer: Self-pay | Admitting: Cardiovascular Disease

## 2014-10-06 ENCOUNTER — Ambulatory Visit: Payer: Medicare Other | Admitting: Internal Medicine

## 2014-10-09 ENCOUNTER — Encounter (HOSPITAL_BASED_OUTPATIENT_CLINIC_OR_DEPARTMENT_OTHER): Payer: Medicare Other | Attending: Plastic Surgery

## 2014-10-09 DIAGNOSIS — L97924 Non-pressure chronic ulcer of unspecified part of left lower leg with necrosis of bone: Secondary | ICD-10-CM | POA: Insufficient documentation

## 2014-10-16 ENCOUNTER — Other Ambulatory Visit: Payer: Self-pay | Admitting: Cardiovascular Disease

## 2014-10-16 ENCOUNTER — Encounter: Payer: Self-pay | Admitting: Internal Medicine

## 2014-10-16 ENCOUNTER — Ambulatory Visit (INDEPENDENT_AMBULATORY_CARE_PROVIDER_SITE_OTHER): Payer: Medicare Other | Admitting: Internal Medicine

## 2014-10-16 VITALS — BP 148/80 | HR 74 | Wt 185.0 lb

## 2014-10-16 DIAGNOSIS — Z23 Encounter for immunization: Secondary | ICD-10-CM | POA: Diagnosis not present

## 2014-10-16 DIAGNOSIS — M5440 Lumbago with sciatica, unspecified side: Secondary | ICD-10-CM | POA: Diagnosis not present

## 2014-10-16 DIAGNOSIS — I4892 Unspecified atrial flutter: Secondary | ICD-10-CM

## 2014-10-16 DIAGNOSIS — Z7901 Long term (current) use of anticoagulants: Secondary | ICD-10-CM | POA: Diagnosis not present

## 2014-10-16 NOTE — Assessment & Plan Note (Signed)
F/u w/Dr Shadad 

## 2014-10-16 NOTE — Progress Notes (Signed)
Subjective:  Patient ID: [name], female    DOB: 10/21/1925  Age: 81 y.o. MRN: [id number]  CC: No chief complaint on file.   HPI [name] presents for leg wound, HTN, LBP f/up.  Outpatient Prescriptions Prior to Visit  Medication Sig Dispense Refill  . acetaminophen (TYLENOL) 500 MG tablet Take 500 mg by mouth daily as needed (pain).     [name] atorvastatin (LIPITOR) 10 MG tablet Take 1 tablet (10 mg total) by mouth every evening. 90 tablet 3  . B Complex-C (B-COMPLEX WITH VITAMIN C) tablet Take 1 tablet by mouth daily.    . busPIRone (BUSPAR) 7.5 MG tablet Take 1 tablet (7.5 mg total) by mouth 2 (two) times daily. 180 tablet 3  . Cholecalciferol (VITAMIN D3) 1000 UNITS tablet Take 1,000 Units by mouth daily.      [name] diltiazem (CARDIZEM CD) 120 MG 24 hr capsule TAKE 1 CAPSULE (120 MG TOTAL) BY MOUTH DAILY. 90 capsule 1  . Ferrous Sulfate (IRON) 325 (65 FE) MG TABS Take 1 tablet by mouth daily.    . fluocinonide cream (LIDEX) 7.00 % Apply 1 application topically 2 (two) times daily as needed (fluid retention).     . furosemide (LASIX) 40 MG tablet 1 po on Mon-Wed-Fri in am (Patient taking differently: Take 40 mg by mouth as directed. 1 po on Mon-Wed-Fri in am) 30 tablet 5  . KLOR-CON M10 10 MEQ tablet Take 10 mEq by mouth daily.  3  . levothyroxine (SYNTHROID, LEVOTHROID) 25 MCG tablet TAKE 1 TABLET BY MOUTH ONCE DAILY FOR THYROID 90 tablet 3  . metoprolol succinate (TOPROL-XL) 50 MG 24 hr tablet TAKE 1 TABLET (50 MG TOTAL) BY MOUTH 2 (TWO) TIMES DAILY. TAKE WITH OR IMMEDIATELY FOLLOWING A MEAL. 180 tablet 3  . mirabegron ER (MYRBETRIQ) 50 MG TB24 tablet Take 1 tablet (50 mg total) by mouth daily. 30 tablet 11  . Multiple Vitamins-Minerals (ZINC PO) Take 1 tablet by mouth daily.    . mupirocin ointment (BACTROBAN) 2 %   0  . polyethylene glycol (MIRALAX / GLYCOLAX) packet Take 17 g by mouth daily as needed for moderate constipation.     . temazepam (RESTORIL) 30 MG capsule TAKE ONE  CAPSULE BY MOUTH AT BEDTIME 30 capsule 5  . vitamin C (ASCORBIC ACID) 500 MG tablet Take 500 mg by mouth daily.    [name] warfarin (COUMADIN) 5 MG tablet TAKE AS DIRECTED BY THERE COUMADIN CLINIC 30 tablet 3   No facility-administered medications prior to visit.    ROS Review of Systems  Constitutional: Negative for chills, activity change, appetite change, fatigue and unexpected weight change.  HENT: Negative for congestion, mouth sores and sinus pressure.   Eyes: Negative for visual disturbance.  Respiratory: Negative for cough and chest tightness.   Gastrointestinal: Negative for nausea and abdominal pain.  Genitourinary: Negative for frequency, difficulty urinating and vaginal pain.  Musculoskeletal: Positive for back pain. Negative for gait problem.  Skin: Negative for pallor and rash.  Neurological: Negative for dizziness, tremors, weakness, numbness and headaches.  Psychiatric/Behavioral: Negative for suicidal ideas, confusion and sleep disturbance.    Objective:  BP 148/80 mmHg  Pulse 74  Wt 185 lb (83.915 kg)  SpO2 95%  BP Readings from Last 3 Encounters:  10/16/14 148/80  08/23/14 150/80  08/09/14 152/76    Wt Readings from Last 3 Encounters:  10/16/14 185 lb (83.915 kg)  08/23/14 185 lb (83.915 kg)  08/09/14 187 lb 3 oz (  84.908 kg)    Physical Exam  Constitutional: She appears well-developed. No distress.  HENT:  Head: Normocephalic.  Right Ear: External ear normal.  Left Ear: External ear normal.  Nose: Nose normal.  Mouth/Throat: Oropharynx is clear and moist.  Eyes: Conjunctivae are normal. Pupils are equal, round, and reactive to light. Right eye exhibits no discharge. Left eye exhibits no discharge.  Neck: Normal range of motion. Neck supple. No JVD present. No tracheal deviation present. No thyromegaly present.  Cardiovascular: Normal rate, regular rhythm and normal heart sounds.   Pulmonary/Chest: No stridor. No respiratory distress. She has no wheezes.    Abdominal: Soft. Bowel sounds are normal. She exhibits no distension and no mass. There is no tenderness. There is no rebound and no guarding.  Musculoskeletal: She exhibits tenderness. She exhibits no edema.  Lymphadenopathy:    She has no cervical adenopathy.  Neurological: She displays normal reflexes. No cranial nerve deficit. She exhibits normal muscle tone. Coordination abnormal.  Skin: No rash noted. No erythema.  Psychiatric: She has a normal mood and affect. Her behavior is normal. Judgment and thought content normal.  LS spine tender Obese Using a walker    Lab Results  Component Value Date   WBC 5.7 08/09/2014   HGB 14.9 08/09/2014   HCT 45.2 08/09/2014   PLT 205 08/09/2014   GLUCOSE 92 08/09/2014   CHOL 116 06/25/2012   TRIG 82 09/05/2013   HDL 41.50 06/25/2012   LDLDIRECT 110.3 05/24/2008   LDLCALC 66 06/25/2012   ALT 19 01/17/2014   AST 24 01/17/2014   NA 141 08/09/2014   K 3.7 08/09/2014   CL 107 08/09/2014   CREATININE 0.90 08/09/2014   BUN 12 08/09/2014   CO2 24 08/09/2014   TSH 0.51 01/17/2014   INR 2.9 09/27/2014   HGBA1C 6.1* 09/08/2013    No results found.  Assessment & Plan:   Diagnoses and all orders for this visit:  Atrial flutter, unspecified  Midline low back pain with sciatica, sciatica laterality unspecified  Long term current use of anticoagulant  Need for influenza vaccination -     Flu Vaccine QUAD 36+ mos IM   I am having [name] maintain her cholecalciferol, B-complex with vitamin C, Iron, acetaminophen, polyethylene glycol, furosemide, busPIRone, atorvastatin, vitamin C, levothyroxine, diltiazem, KLOR-CON M10, Multiple Vitamins-Minerals (ZINC PO), metoprolol succinate, mirabegron ER, temazepam, fluocinonide cream, mupirocin ointment, warfarin, and doxycycline.  Meds ordered this encounter  Medications  . doxycycline (VIBRAMYCIN) 100 MG capsule    Sig: Take 100 mg by mouth 2 (two) times daily.    Refill:  0      Follow-up: Return in about 3 months (around 01/15/2015) for a follow-up visit.  Walker Kehr, MD

## 2014-10-16 NOTE — Progress Notes (Signed)
Pre visit review using our clinic review tool, if applicable. No additional management support is needed unless otherwise documented below in the visit note. 

## 2014-10-16 NOTE — Assessment & Plan Note (Signed)
Metoprolol, Diltiazem On Coumadin

## 2014-10-16 NOTE — Assessment & Plan Note (Signed)
Chronic LBP On Tylenol prn

## 2014-10-16 NOTE — Assessment & Plan Note (Signed)
On coumadin 

## 2014-10-25 ENCOUNTER — Ambulatory Visit (INDEPENDENT_AMBULATORY_CARE_PROVIDER_SITE_OTHER): Payer: Medicare Other | Admitting: Pharmacist

## 2014-10-25 DIAGNOSIS — I4892 Unspecified atrial flutter: Secondary | ICD-10-CM

## 2014-10-25 DIAGNOSIS — Z5181 Encounter for therapeutic drug level monitoring: Secondary | ICD-10-CM

## 2014-10-25 LAB — POCT INR: INR: 2.5

## 2014-10-30 ENCOUNTER — Encounter (HOSPITAL_BASED_OUTPATIENT_CLINIC_OR_DEPARTMENT_OTHER): Payer: Medicare Other | Attending: Plastic Surgery

## 2014-10-30 DIAGNOSIS — T8189XD Other complications of procedures, not elsewhere classified, subsequent encounter: Secondary | ICD-10-CM | POA: Insufficient documentation

## 2014-12-06 DIAGNOSIS — M79605 Pain in left leg: Secondary | ICD-10-CM | POA: Diagnosis not present

## 2014-12-07 ENCOUNTER — Ambulatory Visit (INDEPENDENT_AMBULATORY_CARE_PROVIDER_SITE_OTHER): Payer: Medicare Other

## 2014-12-07 DIAGNOSIS — I4892 Unspecified atrial flutter: Secondary | ICD-10-CM

## 2014-12-07 DIAGNOSIS — Z5181 Encounter for therapeutic drug level monitoring: Secondary | ICD-10-CM | POA: Diagnosis not present

## 2014-12-07 LAB — POCT INR: INR: 2.3

## 2014-12-11 ENCOUNTER — Encounter (HOSPITAL_BASED_OUTPATIENT_CLINIC_OR_DEPARTMENT_OTHER): Payer: Medicare Other | Attending: Plastic Surgery

## 2014-12-11 DIAGNOSIS — T8189XA Other complications of procedures, not elsewhere classified, initial encounter: Secondary | ICD-10-CM | POA: Diagnosis not present

## 2014-12-11 DIAGNOSIS — Y839 Surgical procedure, unspecified as the cause of abnormal reaction of the patient, or of later complication, without mention of misadventure at the time of the procedure: Secondary | ICD-10-CM | POA: Diagnosis not present

## 2014-12-25 ENCOUNTER — Telehealth: Payer: Self-pay | Admitting: Cardiovascular Disease

## 2014-12-25 NOTE — Telephone Encounter (Signed)
A representative from [facility] left a vm on the refill line this morning stating that the patient is going to be getting all of her prescriptions refilled through them. They are unable to receive e-scribe prescriptions and will need them printed and faxed to [phone]. Thanks, MI

## 2014-12-25 NOTE — Telephone Encounter (Signed)
°*  STAT* If patient is at the pharmacy, call can be transferred to refill team.   1. Which medications need to be refilled? (please list name of each medication and dose if known) potassium 10 meq a day   2. Which pharmacy/location (including street and city if local pharmacy) is medication to be sent to? [facility] [address] [phone]  3. Do they need a 30 day or 90 day supply? 30 supply

## 2014-12-25 NOTE — Telephone Encounter (Signed)
Fax requesting refill for potassium chloride received in office.  Completed and faxed to [facility].

## 2014-12-26 ENCOUNTER — Other Ambulatory Visit: Payer: Self-pay

## 2014-12-26 MED ORDER — FUROSEMIDE 40 MG PO TABS: 40.0000 mg | ORAL_TABLET | ORAL | Status: DC

## 2014-12-26 MED ORDER — TEMAZEPAM 30 MG PO CAPS: 30.0000 mg | ORAL_CAPSULE | Freq: Every day | ORAL | Status: DC

## 2014-12-26 MED ORDER — LEVOTHYROXINE SODIUM 25 MCG PO TABS: ORAL_TABLET | ORAL | Status: DC

## 2014-12-26 MED ORDER — ATORVASTATIN CALCIUM 10 MG PO TABS: 10.0000 mg | ORAL_TABLET | Freq: Every evening | ORAL | Status: DC

## 2014-12-26 MED ORDER — KLOR-CON M10 10 MEQ PO TBCR: 10.0000 meq | EXTENDED_RELEASE_TABLET | Freq: Every day | ORAL | Status: DC

## 2014-12-26 MED ORDER — BUSPIRONE HCL 7.5 MG PO TABS: 7.5000 mg | ORAL_TABLET | Freq: Two times a day (BID) | ORAL | Status: DC

## 2014-12-26 MED ORDER — MIRABEGRON ER 50 MG PO TB24: 50.0000 mg | ORAL_TABLET | Freq: Every day | ORAL | Status: DC

## 2015-01-17 ENCOUNTER — Encounter: Payer: Self-pay | Admitting: Internal Medicine

## 2015-01-17 ENCOUNTER — Ambulatory Visit (INDEPENDENT_AMBULATORY_CARE_PROVIDER_SITE_OTHER): Payer: Medicare Other | Admitting: Internal Medicine

## 2015-01-17 ENCOUNTER — Other Ambulatory Visit (INDEPENDENT_AMBULATORY_CARE_PROVIDER_SITE_OTHER): Payer: Medicare Other

## 2015-01-17 VITALS — BP 136/80 | HR 82 | Wt 186.0 lb

## 2015-01-17 DIAGNOSIS — N259 Disorder resulting from impaired renal tubular function, unspecified: Secondary | ICD-10-CM

## 2015-01-17 DIAGNOSIS — G8929 Other chronic pain: Secondary | ICD-10-CM

## 2015-01-17 DIAGNOSIS — S82202S Unspecified fracture of shaft of left tibia, sequela: Secondary | ICD-10-CM

## 2015-01-17 DIAGNOSIS — M5440 Lumbago with sciatica, unspecified side: Secondary | ICD-10-CM | POA: Diagnosis not present

## 2015-01-17 DIAGNOSIS — I10 Essential (primary) hypertension: Secondary | ICD-10-CM

## 2015-01-17 DIAGNOSIS — I4892 Unspecified atrial flutter: Secondary | ICD-10-CM

## 2015-01-17 LAB — URINALYSIS, ROUTINE W REFLEX MICROSCOPIC
Bilirubin Urine: NEGATIVE
Ketones, ur: NEGATIVE
Nitrite: POSITIVE — AB
Specific Gravity, Urine: <=1.005 — AB (ref 1.000–1.030)
Total Protein, Urine: NEGATIVE
Urine Glucose: NEGATIVE
Urobilinogen, UA: 0.2 (ref 0.0–1.0)
pH: 6 (ref 5.0–8.0)

## 2015-01-17 LAB — CBC WITH DIFFERENTIAL/PLATELET
Basophils Absolute: 0.1 10*3/uL (ref 0.0–0.1)
Basophils Relative: 0.8 % (ref 0.0–3.0)
Eosinophils Absolute: 0.1 10*3/uL (ref 0.0–0.7)
Eosinophils Relative: 2 % (ref 0.0–5.0)
HCT: 45.8 % (ref 36.0–46.0)
Hemoglobin: 15.1 g/dL — ABNORMAL HIGH (ref 12.0–15.0)
Lymphocytes Relative: 28.5 % (ref 12.0–46.0)
Lymphs Abs: 1.9 10*3/uL (ref 0.7–4.0)
MCHC: 33 g/dL (ref 30.0–36.0)
MCV: 103.6 fl — ABNORMAL HIGH (ref 78.0–100.0)
Monocytes Absolute: 0.6 10*3/uL (ref 0.1–1.0)
Monocytes Relative: 9.4 % (ref 3.0–12.0)
Neutro Abs: 4 10*3/uL (ref 1.4–7.7)
Neutrophils Relative %: 59.3 % (ref 43.0–77.0)
Platelets: 225 10*3/uL (ref 150.0–400.0)
RBC: 4.42 Mil/uL (ref 3.87–5.11)
RDW: 14.2 % (ref 11.5–15.5)
WBC: 6.8 10*3/uL (ref 4.0–10.5)

## 2015-01-17 LAB — BASIC METABOLIC PANEL
BUN: 20 mg/dL (ref 6–23)
CO2: 31 mEq/L (ref 19–32)
Calcium: 9.9 mg/dL (ref 8.4–10.5)
Chloride: 101 mEq/L (ref 96–112)
Creatinine, Ser: 1.01 mg/dL (ref 0.40–1.20)
GFR: 55.08 mL/min — ABNORMAL LOW (ref >60.00)
Glucose, Bld: 108 mg/dL — ABNORMAL HIGH (ref 70–99)
Potassium: 4.7 mEq/L (ref 3.5–5.1)
Sodium: 140 mEq/L (ref 135–145)

## 2015-01-17 LAB — HEPATIC FUNCTION PANEL
ALT: 27 U/L (ref 0–35)
AST: 26 U/L (ref 0–37)
Albumin: 4.2 g/dL (ref 3.5–5.2)
Alkaline Phosphatase: 80 U/L (ref 39–117)
Bilirubin, Direct: 0.3 mg/dL (ref 0.0–0.3)
Total Bilirubin: 1.3 mg/dL — ABNORMAL HIGH (ref 0.2–1.2)
Total Protein: 8 g/dL (ref 6.0–8.3)

## 2015-01-17 MED ORDER — ATORVASTATIN CALCIUM 10 MG PO TABS: 10.0000 mg | ORAL_TABLET | Freq: Every evening | ORAL | Status: DC

## 2015-01-17 MED ORDER — TRIAMCINOLONE ACETONIDE 0.1 % EX OINT: 1.0000 "application " | TOPICAL_OINTMENT | Freq: Two times a day (BID) | CUTANEOUS | Status: DC

## 2015-01-17 NOTE — Assessment & Plan Note (Signed)
Chronic spinal stenosis S/p fusion - did not help 2010

## 2015-01-17 NOTE — Assessment & Plan Note (Signed)
Labs

## 2015-01-17 NOTE — Progress Notes (Signed)
Subjective:  Patient ID: [name], female    DOB: 03/14/1925  Age: 81 y.o. MRN: [id number]  CC: No chief complaint on file.   HPI [name] presents for LBP, leg pain, HTN f/u. Pt is unable to stand due to pain/wekaness  Outpatient Prescriptions Prior to Visit  Medication Sig Dispense Refill  . acetaminophen (TYLENOL) 500 MG tablet Take 500 mg by mouth daily as needed (pain).     [name] atorvastatin (LIPITOR) 10 MG tablet Take 1 tablet (10 mg total) by mouth every evening. 90 tablet 3  . B Complex-C (B-COMPLEX WITH VITAMIN C) tablet Take 1 tablet by mouth daily.    . busPIRone (BUSPAR) 7.5 MG tablet Take 1 tablet (7.5 mg total) by mouth 2 (two) times daily. 60 tablet 5  . Cholecalciferol (VITAMIN D3) 1000 UNITS tablet Take 1,000 Units by mouth daily.      [name] diltiazem (CARDIZEM CD) 120 MG 24 hr capsule TAKE 1 CAPSULE (120 MG TOTAL) BY MOUTH DAILY. 90 capsule 3  . doxycycline (VIBRAMYCIN) 100 MG capsule Take 100 mg by mouth 2 (two) times daily.  0  . Ferrous Sulfate (IRON) 325 (65 FE) MG TABS Take 1 tablet by mouth daily.    . fluocinonide cream (LIDEX) [id number] % Apply 1 application topically 2 (two) times daily as needed (fluid retention).     . furosemide (LASIX) 40 MG tablet Take 1 tablet (40 mg total) by mouth as directed. 1 po on Mon-Wed-Fri in am 30 tablet 5  . KLOR-CON M10 10 MEQ tablet Take 1 tablet (10 mEq total) by mouth daily. 30 tablet 3  . levothyroxine (SYNTHROID, LEVOTHROID) 25 MCG tablet TAKE 1 TABLET BY MOUTH ONCE DAILY FOR THYROID 90 tablet 3  . metoprolol succinate (TOPROL-XL) 50 MG 24 hr tablet TAKE 1 TABLET (50 MG TOTAL) BY MOUTH 2 (TWO) TIMES DAILY. TAKE WITH OR IMMEDIATELY FOLLOWING A MEAL. 180 tablet 3  . mirabegron ER (MYRBETRIQ) 50 MG TB24 tablet Take 1 tablet (50 mg total) by mouth daily. 30 tablet 11  . Multiple Vitamins-Minerals (ZINC PO) Take 1 tablet by mouth daily.    . mupirocin ointment (BACTROBAN) 2 %   0  . polyethylene glycol (MIRALAX / GLYCOLAX)  packet Take 17 g by mouth daily as needed for moderate constipation.     . temazepam (RESTORIL) 30 MG capsule Take 1 capsule (30 mg total) by mouth at bedtime. 30 capsule 5  . vitamin C (ASCORBIC ACID) 500 MG tablet Take 500 mg by mouth daily.    [name] warfarin (COUMADIN) 5 MG tablet TAKE AS DIRECTED BY THERE COUMADIN CLINIC 30 tablet 3   No facility-administered medications prior to visit.    ROS Review of Systems  Constitutional: Positive for fatigue. Negative for chills, activity change, appetite change and unexpected weight change.  HENT: Negative for congestion, mouth sores and sinus pressure.   Eyes: Negative for visual disturbance.  Respiratory: Negative for cough and chest tightness.   Gastrointestinal: Negative for nausea and abdominal pain.  Genitourinary: Negative for frequency, difficulty urinating and vaginal pain.  Musculoskeletal: Positive for back pain, arthralgias and gait problem.  Skin: Positive for wound. Negative for pallor and rash.  Neurological: Negative for dizziness, tremors, weakness, numbness and headaches.  Psychiatric/Behavioral: Negative for confusion and sleep disturbance.    Objective:  BP 136/80 mmHg  Pulse 82  Wt 186 lb (84.369 kg)  SpO2 97%  BP Readings from Last 3 Encounters:  01/17/15 136/80  10/16/14 148/80  08/23/14 150/80    Wt Readings from Last 3 Encounters:  01/17/15 186 lb (84.369 kg)  10/16/14 185 lb (83.915 kg)  08/23/14 185 lb (83.915 kg)    Physical Exam  Constitutional: She appears well-developed. No distress.  HENT:  Head: Normocephalic.  Right Ear: External ear normal.  Left Ear: External ear normal.  Nose: Nose normal.  Mouth/Throat: Oropharynx is clear and moist.  Eyes: Conjunctivae are normal. Pupils are equal, round, and reactive to light. Right eye exhibits no discharge. Left eye exhibits no discharge.  Neck: Normal range of motion. Neck supple. No JVD present. No tracheal deviation present. No thyromegaly present.    Cardiovascular: Normal rate, regular rhythm and normal heart sounds.   Pulmonary/Chest: No stridor. No respiratory distress. She has no wheezes.  Abdominal: Soft. Bowel sounds are normal. She exhibits no distension and no mass. There is no tenderness. There is no rebound and no guarding.  Musculoskeletal: She exhibits edema and tenderness.  Lymphadenopathy:    She has no cervical adenopathy.  Neurological: She displays normal reflexes. No cranial nerve deficit. She exhibits normal muscle tone. Coordination normal.  Skin: No rash noted. No erythema.  Psychiatric: She has a normal mood and affect. Her behavior is normal. Judgment and thought content normal.  Obese Walker LLE wound is dressed - dry gause over dry scaly skin  Lab Results  Component Value Date   WBC 5.7 08/09/2014   HGB 14.9 08/09/2014   HCT 45.2 08/09/2014   PLT 205 08/09/2014   GLUCOSE 92 08/09/2014   CHOL 116 06/25/2012   TRIG 82 09/05/2013   HDL 41.50 06/25/2012   LDLDIRECT 110.3 05/24/2008   LDLCALC 66 06/25/2012   ALT 19 01/17/2014   AST 24 01/17/2014   NA 141 08/09/2014   K 3.7 08/09/2014   CL 107 08/09/2014   CREATININE 0.90 08/09/2014   BUN 12 08/09/2014   CO2 24 08/09/2014   TSH 0.51 01/17/2014   INR 2.3 12/07/2014   HGBA1C 6.1* 09/08/2013    No results found.  Assessment & Plan:   There are no diagnoses linked to this encounter. I am having [name] maintain her cholecalciferol, B-complex with vitamin C, Iron, acetaminophen, polyethylene glycol, vitamin C, Multiple Vitamins-Minerals (ZINC PO), metoprolol succinate, fluocinonide cream, mupirocin ointment, warfarin, diltiazem, doxycycline, temazepam, busPIRone, furosemide, KLOR-CON M10, mirabegron ER, atorvastatin, and levothyroxine.  No orders of the defined types were placed in this encounter.     Follow-up: No Follow-up on file.  Walker Kehr, MD

## 2015-01-17 NOTE — Progress Notes (Signed)
Pre visit review using our clinic review tool, if applicable. No additional management support is needed unless otherwise documented below in the visit note. 

## 2015-01-17 NOTE — Assessment & Plan Note (Signed)
Small wound is healing

## 2015-01-17 NOTE — Assessment & Plan Note (Signed)
On Coumadin Dr Julianne Handler

## 2015-01-18 ENCOUNTER — Ambulatory Visit (INDEPENDENT_AMBULATORY_CARE_PROVIDER_SITE_OTHER): Payer: Medicare Other | Admitting: Pharmacist

## 2015-01-18 DIAGNOSIS — Z5181 Encounter for therapeutic drug level monitoring: Secondary | ICD-10-CM

## 2015-01-18 DIAGNOSIS — I4892 Unspecified atrial flutter: Secondary | ICD-10-CM

## 2015-01-18 LAB — POCT INR: INR: 1.9

## 2015-01-19 ENCOUNTER — Other Ambulatory Visit: Payer: Self-pay | Admitting: Internal Medicine

## 2015-01-20 ENCOUNTER — Other Ambulatory Visit: Payer: Self-pay | Admitting: Internal Medicine

## 2015-01-20 MED ORDER — CIPROFLOXACIN HCL 250 MG PO TABS: 250.0000 mg | ORAL_TABLET | Freq: Two times a day (BID) | ORAL | Status: DC

## 2015-01-23 ENCOUNTER — Other Ambulatory Visit: Payer: Self-pay | Admitting: *Deleted

## 2015-01-23 MED ORDER — CIPROFLOXACIN HCL 250 MG PO TABS: 250.0000 mg | ORAL_TABLET | Freq: Two times a day (BID) | ORAL | Status: DC

## 2015-01-25 ENCOUNTER — Telehealth: Payer: Self-pay | Admitting: *Deleted

## 2015-01-25 NOTE — Telephone Encounter (Signed)
Received call from pharmacist ([facility]) stating they didn't get the rx for pt Cipro. Verified chart the tranmisson had failed gave her verbal authorization for cipro...[name]

## 2015-02-13 DIAGNOSIS — D1801 Hemangioma of skin and subcutaneous tissue: Secondary | ICD-10-CM | POA: Diagnosis not present

## 2015-02-13 DIAGNOSIS — Z85828 Personal history of other malignant neoplasm of skin: Secondary | ICD-10-CM | POA: Diagnosis not present

## 2015-02-13 DIAGNOSIS — I8312 Varicose veins of left lower extremity with inflammation: Secondary | ICD-10-CM | POA: Diagnosis not present

## 2015-02-13 DIAGNOSIS — L570 Actinic keratosis: Secondary | ICD-10-CM | POA: Diagnosis not present

## 2015-02-13 DIAGNOSIS — I872 Venous insufficiency (chronic) (peripheral): Secondary | ICD-10-CM | POA: Diagnosis not present

## 2015-02-13 DIAGNOSIS — I8311 Varicose veins of right lower extremity with inflammation: Secondary | ICD-10-CM | POA: Diagnosis not present

## 2015-02-13 DIAGNOSIS — L821 Other seborrheic keratosis: Secondary | ICD-10-CM | POA: Diagnosis not present

## 2015-02-15 ENCOUNTER — Ambulatory Visit (INDEPENDENT_AMBULATORY_CARE_PROVIDER_SITE_OTHER): Payer: Medicare Other | Admitting: *Deleted

## 2015-02-15 DIAGNOSIS — Z5181 Encounter for therapeutic drug level monitoring: Secondary | ICD-10-CM

## 2015-02-15 DIAGNOSIS — I4892 Unspecified atrial flutter: Secondary | ICD-10-CM

## 2015-02-15 LAB — POCT INR: INR: 2.3

## 2015-02-25 ENCOUNTER — Emergency Department (HOSPITAL_COMMUNITY): Payer: Medicare Other

## 2015-02-25 ENCOUNTER — Encounter (HOSPITAL_COMMUNITY): Payer: Self-pay | Admitting: Emergency Medicine

## 2015-02-25 ENCOUNTER — Emergency Department (HOSPITAL_COMMUNITY)
Admission: EM | Admit: 2015-02-25 | Discharge: 2015-02-25 | Disposition: A | Discharge status: Home / Self Care | Payer: Medicare Other | Attending: Emergency Medicine | Admitting: Emergency Medicine

## 2015-02-25 DIAGNOSIS — R05 Cough: Secondary | ICD-10-CM | POA: Diagnosis not present

## 2015-02-25 DIAGNOSIS — R32 Unspecified urinary incontinence: Secondary | ICD-10-CM | POA: Insufficient documentation

## 2015-02-25 DIAGNOSIS — Z87828 Personal history of other (healed) physical injury and trauma: Secondary | ICD-10-CM | POA: Insufficient documentation

## 2015-02-25 DIAGNOSIS — G8929 Other chronic pain: Secondary | ICD-10-CM | POA: Diagnosis not present

## 2015-02-25 DIAGNOSIS — Z7952 Long term (current) use of systemic steroids: Secondary | ICD-10-CM | POA: Diagnosis not present

## 2015-02-25 DIAGNOSIS — N3281 Overactive bladder: Secondary | ICD-10-CM | POA: Diagnosis not present

## 2015-02-25 DIAGNOSIS — K5900 Constipation, unspecified: Secondary | ICD-10-CM | POA: Diagnosis not present

## 2015-02-25 DIAGNOSIS — M19041 Primary osteoarthritis, right hand: Secondary | ICD-10-CM | POA: Insufficient documentation

## 2015-02-25 DIAGNOSIS — E161 Other hypoglycemia: Secondary | ICD-10-CM | POA: Diagnosis not present

## 2015-02-25 DIAGNOSIS — R531 Weakness: Secondary | ICD-10-CM | POA: Insufficient documentation

## 2015-02-25 DIAGNOSIS — M19042 Primary osteoarthritis, left hand: Secondary | ICD-10-CM | POA: Diagnosis not present

## 2015-02-25 DIAGNOSIS — I10 Essential (primary) hypertension: Secondary | ICD-10-CM | POA: Insufficient documentation

## 2015-02-25 DIAGNOSIS — E785 Hyperlipidemia, unspecified: Secondary | ICD-10-CM | POA: Insufficient documentation

## 2015-02-25 DIAGNOSIS — E039 Hypothyroidism, unspecified: Secondary | ICD-10-CM | POA: Insufficient documentation

## 2015-02-25 DIAGNOSIS — Z79899 Other long term (current) drug therapy: Secondary | ICD-10-CM | POA: Insufficient documentation

## 2015-02-25 DIAGNOSIS — Z792 Long term (current) use of antibiotics: Secondary | ICD-10-CM | POA: Insufficient documentation

## 2015-02-25 DIAGNOSIS — Z8619 Personal history of other infectious and parasitic diseases: Secondary | ICD-10-CM | POA: Diagnosis not present

## 2015-02-25 DIAGNOSIS — D649 Anemia, unspecified: Secondary | ICD-10-CM | POA: Insufficient documentation

## 2015-02-25 DIAGNOSIS — R110 Nausea: Secondary | ICD-10-CM | POA: Insufficient documentation

## 2015-02-25 DIAGNOSIS — Z7901 Long term (current) use of anticoagulants: Secondary | ICD-10-CM | POA: Diagnosis not present

## 2015-02-25 DIAGNOSIS — R059 Cough, unspecified: Secondary | ICD-10-CM

## 2015-02-25 DIAGNOSIS — Z872 Personal history of diseases of the skin and subcutaneous tissue: Secondary | ICD-10-CM | POA: Insufficient documentation

## 2015-02-25 DIAGNOSIS — R509 Fever, unspecified: Secondary | ICD-10-CM | POA: Diagnosis not present

## 2015-02-25 LAB — CBC WITH DIFFERENTIAL/PLATELET
Basophils Absolute: 0 10*3/uL (ref 0.0–0.1)
Basophils Relative: 1 %
Eosinophils Absolute: 0 10*3/uL (ref 0.0–0.7)
Eosinophils Relative: 1 %
HCT: 45.5 % (ref 36.0–46.0)
Hemoglobin: 15 g/dL (ref 12.0–15.0)
Lymphocytes Relative: 6 %
Lymphs Abs: 0.3 10*3/uL — ABNORMAL LOW (ref 0.7–4.0)
MCH: 34.8 pg — ABNORMAL HIGH (ref 26.0–34.0)
MCHC: 33 g/dL (ref 30.0–36.0)
MCV: 105.6 fL — ABNORMAL HIGH (ref 78.0–100.0)
Monocytes Absolute: 0.7 10*3/uL (ref 0.1–1.0)
Monocytes Relative: 11 %
Neutro Abs: 5 10*3/uL (ref 1.7–7.7)
Neutrophils Relative %: 83 %
Platelets: 172 10*3/uL (ref 150–400)
RBC: 4.31 MIL/uL (ref 3.87–5.11)
RDW: 13.3 % (ref 11.5–15.5)
WBC: 6.1 10*3/uL (ref 4.0–10.5)

## 2015-02-25 LAB — COMPREHENSIVE METABOLIC PANEL
ALT: 31 U/L (ref 14–54)
AST: 36 U/L (ref 15–41)
Albumin: 4 g/dL (ref 3.5–5.0)
Alkaline Phosphatase: 75 U/L (ref 38–126)
Anion gap: 12 (ref 5–15)
BUN: 15 mg/dL (ref 6–20)
CO2: 27 mmol/L (ref 22–32)
Calcium: 9.5 mg/dL (ref 8.9–10.3)
Chloride: 101 mmol/L (ref 101–111)
Creatinine, Ser: 0.89 mg/dL (ref 0.44–1.00)
GFR calc Af Amer: >60 mL/min (ref >60)
GFR calc non Af Amer: 57 mL/min — ABNORMAL LOW (ref >60)
Glucose, Bld: 144 mg/dL — ABNORMAL HIGH (ref 65–99)
Potassium: 3.5 mmol/L (ref 3.5–5.1)
Sodium: 140 mmol/L (ref 135–145)
Total Bilirubin: 1.2 mg/dL (ref 0.3–1.2)
Total Protein: 7.6 g/dL (ref 6.5–8.1)

## 2015-02-25 LAB — URINALYSIS, ROUTINE W REFLEX MICROSCOPIC
Bilirubin Urine: NEGATIVE
Glucose, UA: NEGATIVE mg/dL
Hgb urine dipstick: NEGATIVE
Ketones, ur: NEGATIVE mg/dL
Nitrite: NEGATIVE
Protein, ur: NEGATIVE mg/dL
Specific Gravity, Urine: 1.008 (ref 1.005–1.030)
pH: 7.5 (ref 5.0–8.0)

## 2015-02-25 LAB — URINE MICROSCOPIC-ADD ON

## 2015-02-25 LAB — TROPONIN I: Troponin I: <0.03 ng/mL (ref <0.031)

## 2015-02-25 LAB — PROTIME-INR
INR: 2.38 — ABNORMAL HIGH (ref 0.00–1.49)
Prothrombin Time: 25.7 seconds — ABNORMAL HIGH (ref 11.6–15.2)

## 2015-02-25 MED ORDER — SODIUM CHLORIDE 0.9 % IV BOLUS (SEPSIS)
1000.0000 mL | Freq: Once | INTRAVENOUS | Status: AC
  Administered 2015-02-25: 1000 mL via INTRAVENOUS

## 2015-02-25 MED ORDER — ACETAMINOPHEN 325 MG PO TABS
650.0000 mg | ORAL_TABLET | Freq: Once | ORAL | Status: AC
  Administered 2015-02-25: 650 mg via ORAL
  Filled 2015-02-25: qty 2

## 2015-02-25 NOTE — ED Provider Notes (Signed)
CSN: [id number]     Arrival date & time 02/25/15  0231 History   By signing my name below, I, Forrestine Him, attest that this documentation has been prepared under the direction and in the presence of Ripley Fraise, MD.  Electronically Signed: Forrestine Him, ED Scribe. 02/25/2015. 3:13 AM.   Chief Complaint  Patient presents with  . Weakness  . Nausea    Patient is a 82 y.o. female presenting with weakness. The history is provided by the patient. No language interpreter was used.  Weakness This is a new problem. The current episode started 1 to 2 hours ago. The problem occurs constantly. The problem has not changed since onset.Pertinent negatives include no chest pain, no abdominal pain, no headaches and no shortness of breath. Nothing aggravates the symptoms. Nothing relieves the symptoms. She has tried nothing for the symptoms.    HPI Comments: ALISEN FREDRICKSON brought in by EMS is a 82 y.o. female with a PMHx of HTN, A-Fib, RBBB, and hyperlpidemia who presents to the Emergency Department complaining of constant, ongoing, sudden onset generalized weakness onset just prior to arrival. Pt denies any recent falls, injury, or trauma. She also reports ongoing nausea and an episode of urinary incontinence after getting up from her bed. 1/2 amp of D50 given en route to department. Pt denies any  chills, vomiting, dysuria, chest pain, or shortness of breath. Pt denies any recent new medications or dosage changes.  She is on oral abx (doxycycline) for chronic infection to left LE  She reports chronic back pain, not acute today  PCP: Walker Kehr, MD    Past Medical History  Diagnosis Date  . Hypertension   . LBP (low back pain)     severe lumbar spondylosis s/p L3/L4,L4/L5 fusion 12/10  . Osteopenia   . Hypothyroidism   . Chronic constipation   . History of colitis     DX  2003  WITH ISCHEMIC COLITIS  . History of GI bleed     2011--- UPPER SECONARY GASTRIC ULCER FROM NSAID USE  . History of  Helicobacter pylori infection     2011  . GERD (gastroesophageal reflux disease)   . H/O hiatal hernia   . Diverticulosis of colon   . History of ulcer of lower limb     2012   RIGHT LOWER EXTREMITIY--  STATSIS ULCER  . Bilateral edema of lower extremity   . Persistent atrial fibrillation ([place])     CARDIOLOGIST-   DR MCALANY  . Lumbar spondylolysis   . Anticoagulated on Coumadin   . Hyperlipidemia   . Open wound of left lower leg   . Iron deficiency anemia 2011    elevated MCV  . OA (osteoarthritis)     "hands" (08/25/2013)  . History of diverticulitis of colon     perferated diverticulitis w/ abscess 08-25-2013 drain placed --  resolved without surgical intervention  . History of cellulitis     LEFT LOWER LEG --  SEPT 2015  . RBBB     at fib  . Anxiety     denies  . OAB (overactive bladder)    Past Surgical History  Procedure Laterality Date  . Shoulder open rotator cuff repair Left 1997  . Total hip arthroplasty Right 06-07-2010  . External fixation leg Left 06/16/2013    Procedure: EXTERNAL FIXATION LEG;  Surgeon: Meredith Pel, MD;  Location: [facility];  Service: Orthopedics;  Laterality: Left;  . I&d extremity Left 06/16/2013  Procedure: IRRIGATION AND DEBRIDEMENT EXTREMITY;  Surgeon: Meredith Pel, MD;  Location: [facility];  Service: Orthopedics;  Laterality: Left;  . Application of wound vac Left 06/16/2013    Procedure: APPLICATION OF WOUND VAC;  Surgeon: Meredith Pel, MD;  Location: [facility];  Service: Orthopedics;  Laterality: Left;  . Tibia im nail insertion Left 06/21/2013    Procedure: INTRAMEDULLARY (IM) NAIL TIBIAL;  Surgeon: Meredith Pel, MD;  Location: [place];  Service: Orthopedics;  Laterality: Left;  . External fixation removal Left 06/21/2013    Procedure: REMOVAL EXTERNAL FIXATION LEG;  Surgeon: Meredith Pel, MD;  Location: [place];  Service: Orthopedics;  Laterality: Left;  . Application of a-cell of extremity Left 06/21/2013    Procedure:  APPLICATION OF A-CELL OF EXTREMITY;  Surgeon: Theodoro Kos, DO;  Location: [place];  Service: Plastics;  Laterality: Left;  . Application of wound vac Left 06/21/2013    Procedure: APPLICATION OF WOUND VAC;  Surgeon: Theodoro Kos, DO;  Location: [facility];  Service: Plastics;  Laterality: Left;  . Lumbar fusion  01-02-2009    L3-L5  . Transthoracic echocardiogram  09-11-2010   DR MCALHANY    LVSF  55-60%/  MILD MV CALCIFICATION WITH NO STENOSIS/  MILD MR & TR / MODERATE LAE/  MODERATE TO SEVERE RAE  . Tonsillectomy  AS CHILD  . Application of a-cell of extremity Left 08/22/2013    Procedure: APPLICATION OF A-CELL/VAC TO LOWER LEFT LEG WOUND;  Surgeon: Theodoro Kos, DO;  Location: [facility];  Service: Plastics;  Laterality: Left;  . Upper gi endoscopy  03-29-2010  . I&d extremity Left 02/06/2014    Procedure: IRRIGATION AND DEBRIDEMENT LEFT LEG WOUND ;  Surgeon: Theodoro Kos, DO;  Location: [place];  Service: Plastics;  Laterality: Left;  . Application of a-cell of extremity Left 02/06/2014    Procedure: WITH PLACEMENT OF A -CELL ;  Surgeon: Theodoro Kos, DO;  Location: [place];  Service: Plastics;  Laterality: Left;  . Hardware removal Left 05/23/2014    Procedure: HARDWARE REMOVAL;  Surgeon: Meredith Pel, MD;  Location: [place];  Service: Orthopedics;  Laterality: Left;  REMOVAL OF HARDWARE LEFT TIBIA  . I&d extremity Left 08/09/2014    Procedure: IRRIGATION AND DEBRIDEMENT  OF LEFT LOWER LEG;  Surgeon: Theodoro Kos, DO;  Location: [place];  Service: Plastics;  Laterality: Left;  . Application of a-cell of extremity Left 08/09/2014    Procedure: PLACEMENT OF A CELL;  Surgeon: Theodoro Kos, DO;  Location: [place];  Service: Plastics;  Laterality: Left;   Family History  Problem Relation Age of Onset  . Cancer Father   . Hypertension Other    Social History  Substance Use Topics  . Smoking status: Never Smoker   . Smokeless tobacco: Never  Used  . Alcohol Use: No     Comment: 1 glass occ wine 08/03/14- none in a year   OB History    No data available     Review of Systems  Constitutional: Negative for fever and chills.  Respiratory: Positive for cough. Negative for shortness of breath.   Cardiovascular: Negative for chest pain.  Gastrointestinal: Positive for nausea. Negative for vomiting and abdominal pain.  Neurological: Positive for weakness. Negative for headaches.  All other systems reviewed and are negative.     Allergies  Atenolol; Clarithromycin; Codeine sulfate; Macrodantin; Percocet; and Zosyn  Home Medications   Prior to Admission medications  Medication Sig Start Date End Date Taking? Authorizing Provider  acetaminophen (TYLENOL) 500 MG tablet Take 500 mg by mouth daily as needed (pain).     Historical Provider, MD  atorvastatin (LIPITOR) 10 MG tablet Take 1 tablet (10 mg total) by mouth every evening. 01/17/15   Aleksei [name] V, MD  B Complex-C (B-COMPLEX WITH VITAMIN C) tablet Take 1 tablet by mouth daily.    Historical Provider, MD  busPIRone (BUSPAR) 7.5 MG tablet Take 1 tablet (7.5 mg total) by mouth 2 (two) times daily. 12/26/14   Aleksei [name] V, MD  Cholecalciferol (VITAMIN D3) 1000 UNITS tablet Take 1,000 Units by mouth daily.      Historical Provider, MD  ciprofloxacin (CIPRO) 250 MG tablet Take 1 tablet (250 mg total) by mouth 2 (two) times daily. 01/23/15   Aleksei [name] V, MD  diltiazem (CARDIZEM CD) 120 MG 24 hr capsule TAKE 1 CAPSULE (120 MG TOTAL) BY MOUTH DAILY. 10/17/14   Burnell Blanks, MD  Ferrous Sulfate (IRON) 325 (65 FE) MG TABS Take 1 tablet by mouth daily.    Historical Provider, MD  furosemide (LASIX) 40 MG tablet Take 1 tablet (40 mg total) by mouth as directed. 1 po on Mon-Wed-Fri in am 12/26/14   Aleksei [name] V, MD  KLOR-CON M10 10 MEQ tablet Take 1 tablet (10 mEq total) by mouth daily. 12/26/14   Aleksei [name] V, MD  levothyroxine (SYNTHROID,  LEVOTHROID) 25 MCG tablet TAKE 1 TABLET BY MOUTH ONCE DAILY FOR THYROID 12/26/14   Aleksei [name] V, MD  metoprolol succinate (TOPROL-XL) 50 MG 24 hr tablet TAKE 1 TABLET (50 MG TOTAL) BY MOUTH 2 (TWO) TIMES DAILY. TAKE WITH OR IMMEDIATELY FOLLOWING A MEAL. 05/22/14   Burnell Blanks, MD  mirabegron ER (MYRBETRIQ) 50 MG TB24 tablet Take 1 tablet (50 mg total) by mouth daily. 12/26/14   Aleksei [name] V, MD  Multiple Vitamins-Minerals (ZINC PO) Take 1 tablet by mouth daily.    Historical Provider, MD  mupirocin ointment (BACTROBAN) 2 %  08/01/14   Historical Provider, MD  polyethylene glycol (MIRALAX / GLYCOLAX) packet Take 17 g by mouth daily as needed for moderate constipation.     Historical Provider, MD  temazepam (RESTORIL) 30 MG capsule Take 1 capsule (30 mg total) by mouth at bedtime. 12/26/14   Aleksei [name] V, MD  triamcinolone ointment (KENALOG) 0.1 % Apply 1 application topically 2 (two) times daily. On L dist shin dry skin area 01/17/15   Aleksei [name] V, MD  vitamin C (ASCORBIC ACID) 500 MG tablet Take 500 mg by mouth daily.    Historical Provider, MD  warfarin (COUMADIN) 5 MG tablet TAKE AS DIRECTED BY THERE COUMADIN CLINIC 09/28/14   Burnell Blanks, MD   Triage Vitals: BP 132/92 mmHg  Pulse 101  Temp(Src) 98.9 F (37.2 C) (Oral)  Resp 21  Ht 5\' 4"  (1.626 m)  Wt 180 lb (81.647 kg)  BMI 30.88 kg/m2  SpO2 98%   Physical Exam  CONSTITUTIONAL: Elderly and frail  HEAD: Normocephalic/atraumatic EYES: EOMI/PERRL ENMT: Mucous membranes dry NECK: supple no meningeal signs SPINE/BACK:entire spine nontender CV: irregular LUNGS: crackles noted in the bases, no apparent distress ABDOMEN: soft, nontender, no rebound or guarding, bowel sounds noted throughout abdomen GU:no cva tenderness NEURO: Pt is awake/alert/appropriate, moves all extremitiesx4.  No facial droop.  No arm or leg drift noted bilaterally  EXTREMITIES: pulses normal/equal, full ROM, chronic skin  changes to left LE SKIN: warm, color normal PSYCH: no  abnormalities of mood noted, alert and oriented to situation  ED Course  Procedures   DIAGNOSTIC STUDIES: Oxygen Saturation is 98% on RA, Normal by my interpretation.    COORDINATION OF CARE: 3:07 AM- Will order CXR, CMP, CBC, Troponin I, PT-INR, urinalysis, and EKG. Discussed treatment plan with pt at bedside and pt agreed to plan.    6:37 AM Pt improved She had elevated temp that improved in the ED She is ambulating Labs/imaging reassuring She did have recent cough/congestion, feel this could explain elevated temp Unclear what caused her incontinence at home, but as of now, is able to control her urination, no incontinence here, no new leg weakness and no new/acute back pain She feels comfortable for d/c home She has cardiology f/u tomorrow We discussed strict return precautions Labs Review Labs Reviewed  COMPREHENSIVE METABOLIC PANEL - Abnormal; Notable for the following:    Glucose, Bld 144 (*)    GFR calc non Af Amer 57 (*)    All other components within normal limits  CBC WITH DIFFERENTIAL/PLATELET - Abnormal; Notable for the following:    MCV 105.6 (*)    MCH 34.8 (*)    Lymphs Abs 0.3 (*)    All other components within normal limits  PROTIME-INR - Abnormal; Notable for the following:    Prothrombin Time 25.7 (*)    INR 2.38 (*)    All other components within normal limits  URINALYSIS, ROUTINE W REFLEX MICROSCOPIC (NOT AT [facility]) - Abnormal; Notable for the following:    Leukocytes, UA TRACE (*)    All other components within normal limits  URINE MICROSCOPIC-ADD ON - Abnormal; Notable for the following:    Squamous Epithelial / LPF 0-5 (*)    Bacteria, UA RARE (*)    All other components within normal limits  TROPONIN I    Imaging Review Dg Chest Portable 1 View  02/25/2015  CLINICAL DATA:  Cough, weakness and nausea tonight. EXAM: PORTABLE CHEST 1 VIEW COMPARISON:  08/27/2013 FINDINGS: Unchanged cardiomegaly.  No pulmonary edema. Scarring in the right midlung zone. No consolidation. No pleural effusion or pneumothorax. Multiple overlying monitoring devices partially obscure evaluation of the right hemithorax. IMPRESSION: Cardiomegaly.  Scarring in the right midlung zone. Electronically Signed   By: Jeb Levering M.D.   On: 02/25/2015 03:31   I have personally reviewed and evaluated these images and lab results as part of my medical decision-making.   EKG Interpretation   Date/Time:  Sunday February 25 2015 02:47:50 EST Ventricular Rate:  98 PR Interval:    QRS Duration: 148 QT Interval:  396 QTC Calculation: 506 R Axis:   62 Text Interpretation:  Atrial fibrillation Right bundle branch block ST  depr, consider ischemia, inferior leads Abnormal ekg ** Poor data  quality, interpretation may be adversely affected artifact noted afib and  RBBB seen in prior Confirmed by Christy Gentles  MD, [place] ([id number]) on 02/25/2015  2:54:20 AM      MDM   Final diagnoses:  Weakness  Cough    Nursing notes including past medical history and social history reviewed and considered in documentation xrays/imaging reviewed by myself and considered during evaluation Labs/vital reviewed myself and considered during evaluation    I personally performed the services described in this documentation, which was scribed in my presence. The recorded information has been reviewed and is accurate.       Ripley Fraise, MD 02/25/15 [phone]

## 2015-02-25 NOTE — ED Notes (Signed)
Discharge instructions given to patient and spouse.  Waiting for [facility].

## 2015-02-25 NOTE — Discharge Instructions (Signed)

## 2015-02-25 NOTE — ED Notes (Signed)
Ambulated in the hall using walker.  Slow steady gait noted.  O2 removed.  Sat remains in low to mid 90's.  To continue to monitor.

## 2015-02-25 NOTE — ED Notes (Signed)
Pt transported home via [facility] d/t pt request, states she is unable to use a cab and can only ambulate with a walker.

## 2015-02-25 NOTE — ED Notes (Signed)
Ambulated in hall with walker.  Slow steady gait noted.

## 2015-02-25 NOTE — ED Notes (Signed)
Brought in from home via EMS for sudden onset of weakness and nausea.  Has not vomited.  Denies any pain.  CBG 63.  1/2 amp D50 given enroute.  Hx of Afib.  EMS reports sat of 87% on RA.  O2 placed at 2L up to 97%.

## 2015-02-25 NOTE — ED Notes (Signed)
Pt ambulated w/ walker to restroom - pt A&Ox4, no acute distress, denies any complaints at present

## 2015-02-26 ENCOUNTER — Ambulatory Visit (INDEPENDENT_AMBULATORY_CARE_PROVIDER_SITE_OTHER): Payer: Medicare Other | Admitting: Cardiovascular Disease

## 2015-02-26 ENCOUNTER — Encounter: Payer: Self-pay | Admitting: Cardiovascular Disease

## 2015-02-26 VITALS — BP 96/60 | HR 93 | Ht 64.0 in | Wt 183.4 lb

## 2015-02-26 DIAGNOSIS — I481 Persistent atrial fibrillation: Secondary | ICD-10-CM | POA: Diagnosis not present

## 2015-02-26 DIAGNOSIS — I4819 Other persistent atrial fibrillation: Secondary | ICD-10-CM

## 2015-02-26 NOTE — Progress Notes (Signed)
Chief Complaint  Patient presents with  . Follow-up  . Atrial Fibrillation      History of Present Illness: 82 yo WF with history of HTN, chronic low back pain, iron deficiency anemia and paroxysmal atrial fibrillation/flutter who is here today for follow up. She has had chronic atrial flutter/fibrillation over the past 3 years and has been managed on coumadin and rate control agents. In 2011 she had dark stools with anemia. Coumadin was held and upper endoscopy showed several small clean based gastric ulcers. She was admitted October 2011 and found to be dehydrated with acute renal failure. She was hydrated and this resolved. No further evidence of GI bleeding. She had several episodes of RVR with her atrial fib in the hospital. Coumadin has been restarted without evidence of further GI bleeding.  She had her right hip replaced per Dr. Rich Brave in May 2012. She has had bilateral lower ext swelling since her hip replacement in May 2012. She was started on lasix 40 mg po BID. This seemed to help the swelling. Venous dopplers negative for DVT. Fall June 2015 which led to an open fracture of the left leg tibia and fibula requiring surgical intervention and wound care with wound VAC. Patient was at [facility] when she developed LLQ pain. CT scan of the abdomen showed inflammatory changes in the pelvis and right lower quadrant with wall thickening of small bowel adjacent loops concerning for ruptured diverticulitis and abscess. General Surgery was consulted and she was started on IV Cipro and Flagyl. She had a drain placed transgluteally to drain a pelvic abscess by IR which was removed on 09/06/13. She has had continued issues with healing of her leg wound.   She is here today for follow up. She has not been aware of any tachycardia or skipped beats. No chest pain or SOB. She has had a recent viral syndrome with weakness and sinus congestion. No fevers, cough or chills. She was taken to the ED by EMS  this weekend and given IV fluids. No bleeding issues.   Primary Care Physician: [name]   Past Medical History  Diagnosis Date  . Hypertension   . LBP (low back pain)     severe lumbar spondylosis s/p L3/L4,L4/L5 fusion 12/10  . Osteopenia   . Hypothyroidism   . Chronic constipation   . History of colitis     DX  2003  WITH ISCHEMIC COLITIS  . History of GI bleed     2011--- UPPER SECONARY GASTRIC ULCER FROM NSAID USE  . History of Helicobacter pylori infection     2011  . GERD (gastroesophageal reflux disease)   . H/O hiatal hernia   . Diverticulosis of colon   . History of ulcer of lower limb     2012   RIGHT LOWER EXTREMITIY--  STATSIS ULCER  . Bilateral edema of lower extremity   . Persistent atrial fibrillation ([place])     CARDIOLOGIST-   DR MCALANY  . Lumbar spondylolysis   . Anticoagulated on Coumadin   . Hyperlipidemia   . Open wound of left lower leg   . Iron deficiency anemia 2011    elevated MCV  . OA (osteoarthritis)     "hands" (08/25/2013)  . History of diverticulitis of colon     perferated diverticulitis w/ abscess 08-25-2013 drain placed --  resolved without surgical intervention  . History of cellulitis     LEFT LOWER LEG --  SEPT 2015  . RBBB  at fib  . Anxiety     denies  . OAB (overactive bladder)     Past Surgical History  Procedure Laterality Date  . Shoulder open rotator cuff repair Left 1997  . Total hip arthroplasty Right 06-07-2010  . External fixation leg Left 06/16/2013    Procedure: EXTERNAL FIXATION LEG;  Surgeon: Meredith Pel, MD;  Location: [facility];  Service: Orthopedics;  Laterality: Left;  . I&d extremity Left 06/16/2013    Procedure: IRRIGATION AND DEBRIDEMENT EXTREMITY;  Surgeon: Meredith Pel, MD;  Location: [facility];  Service: Orthopedics;  Laterality: Left;  . Application of wound vac Left 06/16/2013    Procedure: APPLICATION OF WOUND VAC;  Surgeon: Meredith Pel, MD;  Location: [place];  Service: Orthopedics;   Laterality: Left;  . Tibia im nail insertion Left 06/21/2013    Procedure: INTRAMEDULLARY (IM) NAIL TIBIAL;  Surgeon: Meredith Pel, MD;  Location: [place];  Service: Orthopedics;  Laterality: Left;  . External fixation removal Left 06/21/2013    Procedure: REMOVAL EXTERNAL FIXATION LEG;  Surgeon: Meredith Pel, MD;  Location: [facility];  Service: Orthopedics;  Laterality: Left;  . Application of a-cell of extremity Left 06/21/2013    Procedure: APPLICATION OF A-CELL OF EXTREMITY;  Surgeon: Theodoro Kos, DO;  Location: [facility];  Service: Plastics;  Laterality: Left;  . Application of wound vac Left 06/21/2013    Procedure: APPLICATION OF WOUND VAC;  Surgeon: Theodoro Kos, DO;  Location: [place];  Service: Plastics;  Laterality: Left;  . Lumbar fusion  01-02-2009    L3-L5  . Transthoracic echocardiogram  09-11-2010   DR Seleste Tallman    LVSF  55-60%/  MILD MV CALCIFICATION WITH NO STENOSIS/  MILD MR & TR / MODERATE LAE/  MODERATE TO SEVERE RAE  . Tonsillectomy  AS CHILD  . Application of a-cell of extremity Left 08/22/2013    Procedure: APPLICATION OF A-CELL/VAC TO LOWER LEFT LEG WOUND;  Surgeon: Theodoro Kos, DO;  Location: [place];  Service: Plastics;  Laterality: Left;  . Upper gi endoscopy  03-29-2010  . I&d extremity Left 02/06/2014    Procedure: IRRIGATION AND DEBRIDEMENT LEFT LEG WOUND ;  Surgeon: Theodoro Kos, DO;  Location: [place];  Service: Plastics;  Laterality: Left;  . Application of a-cell of extremity Left 02/06/2014    Procedure: WITH PLACEMENT OF A -CELL ;  Surgeon: Theodoro Kos, DO;  Location: [place];  Service: Plastics;  Laterality: Left;  . Hardware removal Left 05/23/2014    Procedure: HARDWARE REMOVAL;  Surgeon: Meredith Pel, MD;  Location: [place];  Service: Orthopedics;  Laterality: Left;  REMOVAL OF HARDWARE LEFT TIBIA  . I&d extremity Left 08/09/2014    Procedure: IRRIGATION AND DEBRIDEMENT  OF LEFT LOWER LEG;  Surgeon:  Theodoro Kos, DO;  Location: [place];  Service: Plastics;  Laterality: Left;  . Application of a-cell of extremity Left 08/09/2014    Procedure: PLACEMENT OF A CELL;  Surgeon: Theodoro Kos, DO;  Location: [place] [facility];  Service: Plastics;  Laterality: Left;    Current Outpatient Prescriptions  Medication Sig Dispense Refill  . acetaminophen (TYLENOL) 500 MG tablet Take 500 mg by mouth daily as needed for mild pain.     [name] atorvastatin (LIPITOR) 10 MG tablet Take 1 tablet (10 mg total) by mouth every evening. 90 tablet 3  . B Complex-C (B-COMPLEX WITH VITAMIN C) tablet Take 1 tablet by mouth daily.    [name]  busPIRone (BUSPAR) 7.5 MG tablet Take 1 tablet (7.5 mg total) by mouth 2 (two) times daily. 60 tablet 5  . Cholecalciferol (VITAMIN D3) 1000 UNITS tablet Take 1,000 Units by mouth daily.      [name] diltiazem (CARDIZEM CD) 120 MG 24 hr capsule TAKE 1 CAPSULE (120 MG TOTAL) BY MOUTH DAILY. 90 capsule 3  . doxycycline (VIBRA-TABS) 100 MG tablet Take 100 mg by mouth 2 (two) times daily.    . Ferrous Sulfate (IRON) 325 (65 FE) MG TABS Take 1 tablet by mouth daily.    . furosemide (LASIX) 40 MG tablet Take 1 tablet (40 mg total) by mouth as directed. 1 po on Mon-Wed-Fri in am 30 tablet 5  . KLOR-CON M10 10 MEQ tablet Take 1 tablet (10 mEq total) by mouth daily. 30 tablet 3  . levothyroxine (SYNTHROID, LEVOTHROID) 25 MCG tablet TAKE 1 TABLET BY MOUTH ONCE DAILY FOR THYROID 90 tablet 3  . metoprolol succinate (TOPROL-XL) 50 MG 24 hr tablet TAKE 1 TABLET (50 MG TOTAL) BY MOUTH 2 (TWO) TIMES DAILY. TAKE WITH OR IMMEDIATELY FOLLOWING A MEAL. 180 tablet 3  . mirabegron ER (MYRBETRIQ) 50 MG TB24 tablet Take 1 tablet (50 mg total) by mouth daily. 30 tablet 11  . Multiple Vitamins-Minerals (ZINC PO) Take 1 tablet by mouth daily.    . polyethylene glycol (MIRALAX / GLYCOLAX) packet Take 17 g by mouth daily as needed for moderate constipation.     . temazepam (RESTORIL) 30 MG capsule Take 1 capsule (30 mg total) by mouth at  bedtime. 30 capsule 5  . triamcinolone ointment (KENALOG) 0.1 % Apply 1 application topically 2 (two) times daily. On L dist shin dry skin area (Patient taking differently: Apply 1 application topically daily. On L dist shin dry skin area) 30 g 1  . vitamin C (ASCORBIC ACID) 500 MG tablet Take 500 mg by mouth daily.    [name] warfarin (COUMADIN) 5 MG tablet TAKE AS DIRECTED BY THERE COUMADIN CLINIC (Patient taking differently: Take 5mg  on Monday and Friday all other days take 2.5mg ) 30 tablet 3   No current facility-administered medications for this visit.    Allergies  Allergen Reactions  . Atenolol Nausea And Vomiting  . Clarithromycin Nausea And Vomiting  . Codeine Sulfate Nausea Only  . Macrodantin Other (See Comments)    Long time ago - reaction unknown  . Percocet [Oxycodone-Acetaminophen] Nausea And Vomiting  . Zosyn [Piperacillin Sod-Tazobactam So] Rash    Morbilliform eruption with itching    Social History   Social History  . Marital Status: Married    Spouse Name: N/A  . Number of Children: 2  . Years of Education: N/A   Occupational History  . retired Unemployed   Social History Main Topics  . Smoking status: Never Smoker   . Smokeless tobacco: Never Used  . Alcohol Use: No     Comment: 1 glass occ wine 08/03/14- none in a year  . Drug Use: No  . Sexual Activity: Not on file   Other Topics Concern  . Not on file   Social History Narrative    Family History  Problem Relation Age of Onset  . Cancer Father   . Hypertension Other     Review of Systems:  As stated in the HPI and otherwise negative.   BP 96/60 mmHg  Pulse 93  Ht 5\' 4"  (1.626 m)  Wt 183 lb 6.4 oz (83.19 kg)  BMI 31.47 kg/m2  Physical Examination: General:  Well developed, well nourished, NAD HEENT: OP clear, mucus membranes moist SKIN: warm, dry. No rashes. Neuro: No focal deficits Musculoskeletal: Muscle strength 5/5 all ext Psychiatric: Mood and affect normal Neck: No JVD, no carotid  bruits, no thyromegaly, no lymphadenopathy. Lungs:Clear bilaterally, no wheezes, rhonci, crackles Cardiovascular: Irregular  rate and rhythm. No murmurs, gallops or rubs. Abdomen:Soft. Bowel sounds present. Non-tender.  Extremities: No lower extremity edema. Pulses are 2 + in the bilateral DP/PT. Chronic venous stasis changes.   EKG:  EKG is not ordered today. The ekg ordered today demonstrates   Recent Labs: 02/25/2015: ALT 31; BUN 15; Creatinine, Ser 0.89; Hemoglobin 15.0; Platelets 172; Potassium 3.5; Sodium 140   Lipid Panel    Component Value Date/Time   CHOL 116 06/25/2012 1619   TRIG 82 09/05/2013 0300   HDL 41.50 06/25/2012 1619   CHOLHDL 3 06/25/2012 1619   VLDL 8.2 06/25/2012 1619   LDLCALC 66 06/25/2012 1619   LDLDIRECT 110.3 05/24/2008 1041     Wt Readings from Last 3 Encounters:  02/26/15 183 lb 6.4 oz (83.19 kg)  02/25/15 180 lb (81.647 kg)  01/17/15 186 lb (84.369 kg)     Other studies Reviewed: Additional studies/ records that were reviewed today include: . Review of the above records demonstrates:    Assessment and Plan:   1. Atrial fibrillation: Chronic persistent, rate controlled. She is anti-coagulated with coumadin with no bleeding issues. Continue beta blocker and Cardizem.   2. HTN: BP is low today. I suspect she is volume depleted given recent poor po intake with viral syndrome. Will ask her to push her po intake. Will lower Toprol dose to 25 mg po BID for next week.   Current medicines are reviewed at length with the patient today.  The patient does not have concerns regarding medicines.  The following changes have been made:  no change  Labs/ tests ordered today include:  No orders of the defined types were placed in this encounter.    Disposition:   FU with me in 6 months  Signed, Lauree Chandler, MD 02/26/2015 4:14 PM    [facility] [address], [place], [place]  [address] Phone: [phone]; Fax: [phone]

## 2015-02-26 NOTE — Patient Instructions (Addendum)
Medication Instructions:   Your physician has recommended you make the following change in your medication:  Decrease Toprol to 25 mg by mouth twice daily for one week and then return to normal dose    Labwork: none  Testing/Procedures: none  Follow-Up: Your physician wants you to follow-up in: 6 months.  You will receive a reminder letter in the mail two months in advance. If you don't receive a letter, please call our office to schedule the follow-up appointment.  Make sure you drink plenty of fluids.  Can take tylenol as needed.  Follow label instructions.     Any Other Special Instructions Will Be Listed Below (If Applicable).     If you need a refill on your cardiac medications before your next appointment, please call your pharmacy.

## 2015-03-06 ENCOUNTER — Encounter: Payer: Self-pay | Admitting: Internal Medicine

## 2015-03-06 ENCOUNTER — Ambulatory Visit (INDEPENDENT_AMBULATORY_CARE_PROVIDER_SITE_OTHER): Payer: Medicare Other | Admitting: Internal Medicine

## 2015-03-06 ENCOUNTER — Ambulatory Visit (INDEPENDENT_AMBULATORY_CARE_PROVIDER_SITE_OTHER)
Admission: RE | Admit: 2015-03-06 | Discharge: 2015-03-06 | Disposition: A | Discharge status: Home / Self Care | Payer: Medicare Other | Source: Ambulatory Visit | Attending: Internal Medicine | Admitting: Internal Medicine

## 2015-03-06 VITALS — BP 110/80 | HR 72 | Temp 98.3°F | Wt 184.0 lb

## 2015-03-06 DIAGNOSIS — J189 Pneumonia, unspecified organism: Secondary | ICD-10-CM

## 2015-03-06 DIAGNOSIS — R05 Cough: Secondary | ICD-10-CM | POA: Diagnosis not present

## 2015-03-06 MED ORDER — LEVOFLOXACIN 500 MG PO TABS: 500.0000 mg | ORAL_TABLET | Freq: Every day | ORAL | Status: DC

## 2015-03-06 MED ORDER — BENZONATATE 200 MG PO CAPS: 200.0000 mg | ORAL_CAPSULE | Freq: Two times a day (BID) | ORAL | Status: DC | PRN

## 2015-03-06 NOTE — Assessment & Plan Note (Signed)
2/17 Clinically ?L CXR Levaquin Tussionex

## 2015-03-06 NOTE — Progress Notes (Signed)
Pre visit review using our clinic review tool, if applicable. No additional management support is needed unless otherwise documented below in the visit note. 

## 2015-03-06 NOTE — Patient Instructions (Signed)
Use over-the-counter  "cold" medicines  such as " Delsym" or" Robitussin" cough syrup varietis for cough.  You can use plain "Tylenol" or "Advil" for fever, chills and achyness. Use Halls or Ricola cough drops.  Please, make an appointment if you are not better or if you're worse.  

## 2015-03-06 NOTE — Progress Notes (Signed)
Subjective:  Patient ID: [name], female    DOB: 1925-05-04  Age: 82 y.o. MRN: [id number]  CC: No chief complaint on file.   HPI [name] presents for cough, weakness, chills x 1 week.   Outpatient Prescriptions Prior to Visit  Medication Sig Dispense Refill  . acetaminophen (TYLENOL) 500 MG tablet Take 500 mg by mouth daily as needed for mild pain.     [name] atorvastatin (LIPITOR) 10 MG tablet Take 1 tablet (10 mg total) by mouth every evening. 90 tablet 3  . B Complex-C (B-COMPLEX WITH VITAMIN C) tablet Take 1 tablet by mouth daily.    . busPIRone (BUSPAR) 7.5 MG tablet Take 1 tablet (7.5 mg total) by mouth 2 (two) times daily. 60 tablet 5  . Cholecalciferol (VITAMIN D3) 1000 UNITS tablet Take 1,000 Units by mouth daily.      [name] diltiazem (CARDIZEM CD) 120 MG 24 hr capsule TAKE 1 CAPSULE (120 MG TOTAL) BY MOUTH DAILY. 90 capsule 3  . doxycycline (VIBRA-TABS) 100 MG tablet Take 100 mg by mouth 2 (two) times daily.    . Ferrous Sulfate (IRON) 325 (65 FE) MG TABS Take 1 tablet by mouth daily.    . furosemide (LASIX) 40 MG tablet Take 1 tablet (40 mg total) by mouth as directed. 1 po on Mon-Wed-Fri in am 30 tablet 5  . KLOR-CON M10 10 MEQ tablet Take 1 tablet (10 mEq total) by mouth daily. 30 tablet 3  . levothyroxine (SYNTHROID, LEVOTHROID) 25 MCG tablet TAKE 1 TABLET BY MOUTH ONCE DAILY FOR THYROID 90 tablet 3  . metoprolol succinate (TOPROL-XL) 50 MG 24 hr tablet TAKE 1 TABLET (50 MG TOTAL) BY MOUTH 2 (TWO) TIMES DAILY. TAKE WITH OR IMMEDIATELY FOLLOWING A MEAL. 180 tablet 3  . mirabegron ER (MYRBETRIQ) 50 MG TB24 tablet Take 1 tablet (50 mg total) by mouth daily. 30 tablet 11  . Multiple Vitamins-Minerals (ZINC PO) Take 1 tablet by mouth daily.    . polyethylene glycol (MIRALAX / GLYCOLAX) packet Take 17 g by mouth daily as needed for moderate constipation.     . temazepam (RESTORIL) 30 MG capsule Take 1 capsule (30 mg total) by mouth at bedtime. 30 capsule 5  . triamcinolone  ointment (KENALOG) 0.1 % Apply 1 application topically 2 (two) times daily. On L dist shin dry skin area (Patient taking differently: Apply 1 application topically daily. On L dist shin dry skin area) 30 g 1  . vitamin C (ASCORBIC ACID) 500 MG tablet Take 500 mg by mouth daily.    [name] warfarin (COUMADIN) 5 MG tablet TAKE AS DIRECTED BY THERE COUMADIN CLINIC (Patient taking differently: Take 5mg  on Monday and Friday all other days take 2.5mg ) 30 tablet 3   No facility-administered medications prior to visit.    ROS Review of Systems  Constitutional: Positive for chills and fatigue. Negative for activity change, appetite change and unexpected weight change.  HENT: Positive for congestion and sinus pressure. Negative for mouth sores.   Eyes: Negative for visual disturbance.  Respiratory: Positive for cough and wheezing. Negative for chest tightness.   Gastrointestinal: Negative for nausea and abdominal pain.  Genitourinary: Negative for frequency, difficulty urinating and vaginal pain.  Musculoskeletal: Positive for back pain and gait problem. Negative for joint swelling.  Skin: Negative for pallor and rash.  Neurological: Negative for dizziness, tremors, weakness, numbness and headaches.  Psychiatric/Behavioral: Negative for confusion and sleep disturbance.    Objective:  BP 110/80 mmHg  Pulse 72  Temp(Src) 98.3 F (36.8 C) (Oral)  Wt 184 lb (83.462 kg)  SpO2 97%  BP Readings from Last 3 Encounters:  03/06/15 110/80  02/26/15 96/60  02/25/15 155/110    Wt Readings from Last 3 Encounters:  03/06/15 184 lb (83.462 kg)  02/26/15 183 lb 6.4 oz (83.19 kg)  02/25/15 180 lb (81.647 kg)    Physical Exam  Constitutional: She appears well-developed. No distress.  HENT:  Head: Normocephalic.  Right Ear: External ear normal.  Left Ear: External ear normal.  Nose: Nose normal.  Mouth/Throat: Oropharynx is clear and moist.  Eyes: Conjunctivae are normal. Pupils are equal, round, and  reactive to light. Right eye exhibits no discharge. Left eye exhibits no discharge.  Neck: Normal range of motion. Neck supple. No JVD present. No tracheal deviation present. No thyromegaly present.  Cardiovascular: Normal rate, regular rhythm and normal heart sounds.   Pulmonary/Chest: No stridor. No respiratory distress. She has wheezes. She has no rales.  Abdominal: Soft. Bowel sounds are normal. She exhibits no distension and no mass. There is no tenderness. There is no rebound and no guarding.  Musculoskeletal: She exhibits no edema or tenderness.  Lymphadenopathy:    She has no cervical adenopathy.  Neurological: She displays normal reflexes. No cranial nerve deficit. She exhibits normal muscle tone. Coordination normal.  Skin: No rash noted. No erythema.  Psychiatric: She has a normal mood and affect. Her behavior is normal. Judgment and thought content normal.   eryth throat L ronchi Looks tired  Lab Results  Component Value Date   WBC 6.1 02/25/2015   HGB 15.0 02/25/2015   HCT 45.5 02/25/2015   PLT 172 02/25/2015   GLUCOSE 144* 02/25/2015   CHOL 116 06/25/2012   TRIG 82 09/05/2013   HDL 41.50 06/25/2012   LDLDIRECT 110.3 05/24/2008   LDLCALC 66 06/25/2012   ALT 31 02/25/2015   AST 36 02/25/2015   NA 140 02/25/2015   K 3.5 02/25/2015   CL 101 02/25/2015   CREATININE 0.89 02/25/2015   BUN 15 02/25/2015   CO2 27 02/25/2015   TSH 0.51 01/17/2014   INR 2.38* 02/25/2015   HGBA1C 6.1* 09/08/2013    Dg Chest Portable 1 View  02/25/2015  CLINICAL DATA:  Cough, weakness and nausea tonight. EXAM: PORTABLE CHEST 1 VIEW COMPARISON:  08/27/2013 FINDINGS: Unchanged cardiomegaly. No pulmonary edema. Scarring in the right midlung zone. No consolidation. No pleural effusion or pneumothorax. Multiple overlying monitoring devices partially obscure evaluation of the right hemithorax. IMPRESSION: Cardiomegaly.  Scarring in the right midlung zone. Electronically Signed   By: Jeb Levering M.D.   On: 02/25/2015 03:31    Assessment & Plan:   Diagnoses and all orders for this visit:  CAP (community acquired pneumonia) -     DG Chest 2 View  Other orders -     Discontinue: levofloxacin (LEVAQUIN) 500 MG tablet; Take 1 tablet (500 mg total) by mouth daily. -     Discontinue: benzonatate (TESSALON) 200 MG capsule; Take 1 capsule (200 mg total) by mouth 2 (two) times daily as needed for cough. -     benzonatate (TESSALON) 200 MG capsule; Take 1 capsule (200 mg total) by mouth 2 (two) times daily as needed for cough. -     levofloxacin (LEVAQUIN) 500 MG tablet; Take 1 tablet (500 mg total) by mouth daily.  I am having [name] maintain her cholecalciferol, B-complex with vitamin C, Iron, acetaminophen, polyethylene glycol, vitamin C, Multiple  Vitamins-Minerals (ZINC PO), metoprolol succinate, warfarin, diltiazem, temazepam, busPIRone, furosemide, KLOR-CON M10, mirabegron ER, levothyroxine, atorvastatin, triamcinolone ointment, doxycycline, doxycycline, benzonatate, and levofloxacin.  Meds ordered this encounter  Medications  . doxycycline (VIBRAMYCIN) 100 MG capsule    Sig: Take 100 mg by mouth 2 (two) times daily.    Refill:  0  . DISCONTD: levofloxacin (LEVAQUIN) 500 MG tablet    Sig: Take 1 tablet (500 mg total) by mouth daily.    Dispense:  10 tablet    Refill:  0  . DISCONTD: benzonatate (TESSALON) 200 MG capsule    Sig: Take 1 capsule (200 mg total) by mouth 2 (two) times daily as needed for cough.    Dispense:  60 capsule    Refill:  0  . benzonatate (TESSALON) 200 MG capsule    Sig: Take 1 capsule (200 mg total) by mouth 2 (two) times daily as needed for cough.    Dispense:  60 capsule    Refill:  0  . levofloxacin (LEVAQUIN) 500 MG tablet    Sig: Take 1 tablet (500 mg total) by mouth daily.    Dispense:  10 tablet    Refill:  0     Follow-up: No Follow-up on file.  Walker Kehr, MD

## 2015-03-06 NOTE — Progress Notes (Signed)
Resent to [facility]

## 2015-03-06 NOTE — Progress Notes (Signed)
Transmission fail resent fax to [facility]...[name]

## 2015-03-07 ENCOUNTER — Telehealth: Payer: Self-pay | Admitting: Internal Medicine

## 2015-03-07 NOTE — Telephone Encounter (Signed)
Call back x ray results

## 2015-03-07 NOTE — Telephone Encounter (Signed)
Notified pt with md response on xray.Marland KitchenJohny Bell   "Notes Recorded by [name], MD on 03/06/2015 at 11:10 PM [name], please, inform patient that her CXR is ok. Plan - as we discussed". Pt is wanting to verify if she still need to take the Levaquin that was rx if she don't have pneumonia...[name]

## 2015-03-07 NOTE — Telephone Encounter (Signed)
Yes, take levaquin pls

## 2015-03-08 MED ORDER — DOXYCYCLINE HYCLATE 100 MG PO TABS: 100.0000 mg | ORAL_TABLET | Freq: Two times a day (BID) | ORAL | Status: DC

## 2015-03-08 NOTE — Telephone Encounter (Signed)
Notified pt with md response. Pt states when she takes the antibiotic it makes her feel terrible. It makes her stomach queasy, she nauseated. Is their anything else he can take or recommend...[name]

## 2015-03-08 NOTE — Telephone Encounter (Signed)
Ok - d/c Levaquin I emaild Rx Doxy Thx

## 2015-03-08 NOTE — Telephone Encounter (Signed)
Would not go through electronically had to fax...[name]

## 2015-03-08 NOTE — Telephone Encounter (Signed)
Called pt gave her md response. She states she been taking doxycycline for her leg. She went ahead and took  half of the Levaquin, and it didin't bother her she had ate some crackers. Advise pt she need to take either one, so since she hasve the Levaquin she is going to continue taking 1/2 twice a day. Called [facility] spoke with corry cancel script that was sent for doxycycline...Johny Chess

## 2015-03-09 ENCOUNTER — Telehealth: Payer: Self-pay

## 2015-03-09 NOTE — Telephone Encounter (Signed)
PA initiated via faxed form

## 2015-03-12 ENCOUNTER — Telehealth: Payer: Self-pay | Admitting: Internal Medicine

## 2015-03-12 NOTE — Telephone Encounter (Signed)
Patient called to advise that her current bladder control medication is falling off of her formulary and will cost substantial money. She states that she has confirmed that oxybutine is on the formulary. Please send a new script to [facility]

## 2015-03-13 ENCOUNTER — Telehealth: Payer: Self-pay | Admitting: Internal Medicine

## 2015-03-13 MED ORDER — OXYBUTYNIN CHLORIDE 5 MG PO TABS: 5.0000 mg | ORAL_TABLET | Freq: Two times a day (BID) | ORAL | Status: DC

## 2015-03-13 NOTE — Telephone Encounter (Signed)
OK _ Oxybutinin will likely cause a dry mouth Thx

## 2015-03-13 NOTE — Telephone Encounter (Signed)
[facility] left a message on your vm regarding pts medication for Myrbetriq is too expensive and needs an alternative. She was unable to leave her fax # She states she can only take a fax sent over manually or a verbal request. Her fax # is [phone] Phone # [phone]

## 2015-03-14 ENCOUNTER — Other Ambulatory Visit: Payer: Self-pay | Admitting: *Deleted

## 2015-03-14 DIAGNOSIS — M79605 Pain in left leg: Secondary | ICD-10-CM | POA: Diagnosis not present

## 2015-03-14 DIAGNOSIS — L03116 Cellulitis of left lower limb: Secondary | ICD-10-CM | POA: Diagnosis not present

## 2015-03-14 MED ORDER — OXYBUTYNIN CHLORIDE 5 MG PO TABS: 5.0000 mg | ORAL_TABLET | Freq: Two times a day (BID) | ORAL | Status: DC

## 2015-03-15 NOTE — Telephone Encounter (Signed)
Received call from ot she states she has been trying to get this taking care of since Monday. Need alternative for Metbetriq. Inform pt per chart MD change med to Oxybutin (see dup phone note from 2/20). Will call [facility] and give order verbal. Called [facility] spoke with pharmacist gave order for Oxybutinin....Johny Chess   [name], MD at 03/13/2015 9:09 AM     Status: Signed       Expand All Collapse All   OK _ Oxybutinin will likely cause a dry mouth

## 2015-03-15 NOTE — Telephone Encounter (Signed)
Duplicate msg has been taking care of. Pt aware...[name]

## 2015-03-19 NOTE — Telephone Encounter (Signed)
PA Approved per pt's pharmacy (at [facility])

## 2015-03-22 ENCOUNTER — Ambulatory Visit (INDEPENDENT_AMBULATORY_CARE_PROVIDER_SITE_OTHER): Payer: Medicare Other | Admitting: *Deleted

## 2015-03-22 DIAGNOSIS — I4892 Unspecified atrial flutter: Secondary | ICD-10-CM | POA: Diagnosis not present

## 2015-03-22 DIAGNOSIS — Z5181 Encounter for therapeutic drug level monitoring: Secondary | ICD-10-CM | POA: Diagnosis not present

## 2015-03-22 LAB — POCT INR: INR: 2.5

## 2015-04-16 ENCOUNTER — Encounter: Payer: Self-pay | Admitting: Internal Medicine

## 2015-04-16 ENCOUNTER — Ambulatory Visit (INDEPENDENT_AMBULATORY_CARE_PROVIDER_SITE_OTHER): Payer: Medicare Other | Admitting: Internal Medicine

## 2015-04-16 VITALS — BP 118/80 | HR 74 | Wt 185.0 lb

## 2015-04-16 DIAGNOSIS — I10 Essential (primary) hypertension: Secondary | ICD-10-CM

## 2015-04-16 DIAGNOSIS — M5440 Lumbago with sciatica, unspecified side: Secondary | ICD-10-CM | POA: Diagnosis not present

## 2015-04-16 DIAGNOSIS — G8929 Other chronic pain: Secondary | ICD-10-CM

## 2015-04-16 DIAGNOSIS — J189 Pneumonia, unspecified organism: Secondary | ICD-10-CM | POA: Diagnosis not present

## 2015-04-16 DIAGNOSIS — I4892 Unspecified atrial flutter: Secondary | ICD-10-CM

## 2015-04-16 NOTE — Progress Notes (Signed)
Pre visit review using our clinic review tool, if applicable. No additional management support is needed unless otherwise documented below in the visit note. 

## 2015-04-16 NOTE — Progress Notes (Signed)
Subjective:  Patient ID: [name], female    DOB: [birth year]  Age: 82 y.o. MRN: [id number]  CC: No chief complaint on file.   HPI [name] presents for HTN, LBP, anxiety f/u  Outpatient Prescriptions Prior to Visit  Medication Sig Dispense Refill  . acetaminophen (TYLENOL) 500 MG tablet Take 500 mg by mouth daily as needed for mild pain.     [name] atorvastatin (LIPITOR) 10 MG tablet Take 1 tablet (10 mg total) by mouth every evening. 90 tablet 3  . B Complex-C (B-COMPLEX WITH VITAMIN C) tablet Take 1 tablet by mouth daily.    . benzonatate (TESSALON) 200 MG capsule Take 1 capsule (200 mg total) by mouth 2 (two) times daily as needed for cough. 60 capsule 0  . busPIRone (BUSPAR) 7.5 MG tablet Take 1 tablet (7.5 mg total) by mouth 2 (two) times daily. 60 tablet 5  . Cholecalciferol (VITAMIN D3) 1000 UNITS tablet Take 1,000 Units by mouth daily.      [name] diltiazem (CARDIZEM CD) 120 MG 24 hr capsule TAKE 1 CAPSULE (120 MG TOTAL) BY MOUTH DAILY. 90 capsule 3  . Ferrous Sulfate (IRON) 325 (65 FE) MG TABS Take 1 tablet by mouth daily.    . furosemide (LASIX) 40 MG tablet Take 1 tablet (40 mg total) by mouth as directed. 1 po on Mon-Wed-Fri in am 30 tablet 5  . KLOR-CON M10 10 MEQ tablet Take 1 tablet (10 mEq total) by mouth daily. 30 tablet 3  . levothyroxine (SYNTHROID, LEVOTHROID) 25 MCG tablet TAKE 1 TABLET BY MOUTH ONCE DAILY FOR THYROID 90 tablet 3  . metoprolol succinate (TOPROL-XL) 50 MG 24 hr tablet TAKE 1 TABLET (50 MG TOTAL) BY MOUTH 2 (TWO) TIMES DAILY. TAKE WITH OR IMMEDIATELY FOLLOWING A MEAL. 180 tablet 3  . Multiple Vitamins-Minerals (ZINC PO) Take 1 tablet by mouth daily.    [name] oxybutynin (DITROPAN) 5 MG tablet Take 1 tablet (5 mg total) by mouth 2 (two) times daily. 60 tablet 11  . polyethylene glycol (MIRALAX / GLYCOLAX) packet Take 17 g by mouth daily as needed for moderate constipation.     . temazepam (RESTORIL) 30 MG capsule Take 1 capsule (30 mg total) by mouth at  bedtime. 30 capsule 5  . triamcinolone ointment (KENALOG) 0.1 % Apply 1 application topically 2 (two) times daily. On L dist shin dry skin area (Patient taking differently: Apply 1 application topically daily. On L dist shin dry skin area) 30 g 1  . vitamin C (ASCORBIC ACID) 500 MG tablet Take 500 mg by mouth daily.    [name] warfarin (COUMADIN) 5 MG tablet TAKE AS DIRECTED BY THERE COUMADIN CLINIC (Patient taking differently: Take 5mg  on Monday and Friday all other days take 2.5mg ) 30 tablet 3   No facility-administered medications prior to visit.    ROS Review of Systems  Constitutional: Negative for chills, activity change, appetite change, fatigue and unexpected weight change.  HENT: Negative for congestion, mouth sores and sinus pressure.   Eyes: Negative for visual disturbance.  Respiratory: Negative for cough and chest tightness.   Gastrointestinal: Negative for nausea and abdominal pain.  Genitourinary: Negative for frequency, difficulty urinating and vaginal pain.  Musculoskeletal: Positive for back pain and gait problem.  Skin: Negative for pallor and rash.  Neurological: Negative for dizziness, tremors, weakness, numbness and headaches.  Psychiatric/Behavioral: Negative for suicidal ideas, confusion and sleep disturbance.    Objective:  BP 118/80 mmHg  Pulse 74  Wt  185 lb (83.915 kg)  SpO2 96%  BP Readings from Last 3 Encounters:  04/16/15 118/80  03/06/15 110/80  02/26/15 96/60    Wt Readings from Last 3 Encounters:  04/16/15 185 lb (83.915 kg)  03/06/15 184 lb (83.462 kg)  02/26/15 183 lb 6.4 oz (83.19 kg)    Physical Exam  Constitutional: She appears well-developed. No distress.  HENT:  Head: Normocephalic.  Right Ear: External ear normal.  Left Ear: External ear normal.  Nose: Nose normal.  Mouth/Throat: Oropharynx is clear and moist.  Eyes: Conjunctivae are normal. Pupils are equal, round, and reactive to light. Right eye exhibits no discharge. Left eye  exhibits no discharge.  Neck: Normal range of motion. Neck supple. No JVD present. No tracheal deviation present. No thyromegaly present.  Cardiovascular: Normal rate, regular rhythm and normal heart sounds.   Pulmonary/Chest: No stridor. No respiratory distress. She has no wheezes.  Abdominal: Soft. Bowel sounds are normal. She exhibits no distension and no mass. There is no tenderness. There is no rebound and no guarding.  Musculoskeletal: She exhibits tenderness. She exhibits no edema.  Lymphadenopathy:    She has no cervical adenopathy.  Neurological: She displays normal reflexes. No cranial nerve deficit. She exhibits normal muscle tone. Coordination abnormal.  Skin: No rash noted. No erythema.  Psychiatric: She has a normal mood and affect. Her behavior is normal. Judgment and thought content normal.    Lab Results  Component Value Date   WBC 6.1 02/25/2015   HGB 15.0 02/25/2015   HCT 45.5 02/25/2015   PLT 172 02/25/2015   GLUCOSE 144* 02/25/2015   CHOL 116 06/25/2012   TRIG 82 09/05/2013   HDL 41.50 06/25/2012   LDLDIRECT 110.3 05/24/2008   LDLCALC 66 06/25/2012   ALT 31 02/25/2015   AST 36 02/25/2015   NA 140 02/25/2015   K 3.5 02/25/2015   CL 101 02/25/2015   CREATININE 0.89 02/25/2015   BUN 15 02/25/2015   CO2 27 02/25/2015   TSH 0.51 01/17/2014   INR 2.5 03/22/2015   HGBA1C 6.1* 09/08/2013    Dg Chest 2 View  03/06/2015  CLINICAL DATA:  Cough and congestion for 1 week EXAM: CHEST  2 VIEW COMPARISON:  02/25/2015 FINDINGS: Cardiac shadow is within normal limits. Mild interstitial changes are again noted. No focal infiltrate or sizable effusion is seen. Persistent scarring is noted in the mid right lung. No new focal abnormality is noted. IMPRESSION: No acute abnormality Electronically Signed   By: Inez Catalina M.D.   On: 03/06/2015 13:44    Assessment & Plan:   There are no diagnoses linked to this encounter. I am having Ms. Blancett maintain her cholecalciferol,  B-complex with vitamin C, Iron, acetaminophen, polyethylene glycol, vitamin C, Multiple Vitamins-Minerals (ZINC PO), metoprolol succinate, warfarin, diltiazem, temazepam, busPIRone, furosemide, KLOR-CON M10, levothyroxine, atorvastatin, triamcinolone ointment, benzonatate, and oxybutynin.  No orders of the defined types were placed in this encounter.     Follow-up: No Follow-up on file.  Walker Kehr, MD

## 2015-04-16 NOTE — Assessment & Plan Note (Signed)
Tylenol prn Walker 

## 2015-04-16 NOTE — Assessment & Plan Note (Signed)
Metoprolol, Diltiazem On Coumadin Recurrent  

## 2015-04-16 NOTE — Assessment & Plan Note (Signed)
Resolved

## 2015-04-16 NOTE — Assessment & Plan Note (Signed)
Chronic Metoprolol, Diltiazem

## 2015-05-03 ENCOUNTER — Ambulatory Visit (INDEPENDENT_AMBULATORY_CARE_PROVIDER_SITE_OTHER): Payer: Medicare Other | Admitting: Pharmacist

## 2015-05-03 DIAGNOSIS — Z5181 Encounter for therapeutic drug level monitoring: Secondary | ICD-10-CM

## 2015-05-03 DIAGNOSIS — I4892 Unspecified atrial flutter: Secondary | ICD-10-CM

## 2015-05-03 LAB — POCT INR: INR: 1.9

## 2015-05-31 DIAGNOSIS — Z0100 Encounter for examination of eyes and vision without abnormal findings: Secondary | ICD-10-CM | POA: Diagnosis not present

## 2015-05-31 DIAGNOSIS — Z961 Presence of intraocular lens: Secondary | ICD-10-CM | POA: Diagnosis not present

## 2015-06-07 ENCOUNTER — Ambulatory Visit (INDEPENDENT_AMBULATORY_CARE_PROVIDER_SITE_OTHER): Payer: Medicare Other

## 2015-06-07 DIAGNOSIS — Z5181 Encounter for therapeutic drug level monitoring: Secondary | ICD-10-CM

## 2015-06-07 DIAGNOSIS — I4892 Unspecified atrial flutter: Secondary | ICD-10-CM | POA: Diagnosis not present

## 2015-06-07 DIAGNOSIS — Z1231 Encounter for screening mammogram for malignant neoplasm of breast: Secondary | ICD-10-CM | POA: Diagnosis not present

## 2015-06-07 DIAGNOSIS — N63 Unspecified lump in breast: Secondary | ICD-10-CM | POA: Diagnosis not present

## 2015-06-07 LAB — HM MAMMOGRAPHY

## 2015-06-07 LAB — POCT INR: INR: 2.1

## 2015-06-11 ENCOUNTER — Encounter: Payer: Self-pay | Admitting: Internal Medicine

## 2015-06-11 ENCOUNTER — Other Ambulatory Visit: Payer: Self-pay | Admitting: Radiology

## 2015-06-11 DIAGNOSIS — N63 Unspecified lump in breast: Secondary | ICD-10-CM | POA: Diagnosis not present

## 2015-06-11 DIAGNOSIS — N6489 Other specified disorders of breast: Secondary | ICD-10-CM | POA: Diagnosis not present

## 2015-06-11 DIAGNOSIS — Z0000 Encounter for general adult medical examination without abnormal findings: Secondary | ICD-10-CM | POA: Diagnosis not present

## 2015-06-21 DIAGNOSIS — N63 Unspecified lump in breast: Secondary | ICD-10-CM | POA: Diagnosis not present

## 2015-06-27 ENCOUNTER — Telehealth: Payer: Self-pay | Admitting: *Deleted

## 2015-06-27 ENCOUNTER — Encounter: Payer: Self-pay | Admitting: Cardiovascular Disease

## 2015-06-27 NOTE — Telephone Encounter (Signed)
Received request for surgical clearance from [facility].  Per Dr. Angelena Form pt may proceed with surgery if not having any chest pain.  I placed call to pt to see how she was feeling. Left message to call back. If having chest pain or other cardiac issues will need office visit.

## 2015-06-28 ENCOUNTER — Telehealth: Payer: Self-pay | Admitting: *Deleted

## 2015-06-28 MED ORDER — TEMAZEPAM 30 MG PO CAPS
30.0000 mg | ORAL_CAPSULE | Freq: Every day | ORAL | Status: DC
Start: 2015-06-28 — End: 2015-07-16

## 2015-06-28 NOTE — Telephone Encounter (Signed)
Receive call from  [facility] requesting refill on pt Temazepam 30 mg. The rx that she has on file has expired. Can fax to [phone]...Johny Chess

## 2015-06-28 NOTE — Telephone Encounter (Signed)
OK to fill this prescription with additional refills x3 Thank you!  

## 2015-06-28 NOTE — Telephone Encounter (Signed)
Follow up     Patient returning call back to nurse from yesterday - pending surgery

## 2015-06-28 NOTE — Telephone Encounter (Signed)
Spoke with patient who denies any cardiac complaints, no cp or SOB.  Will have clearance faxed to Dr Durward Fortes.  She is aware OK to hold coumadin 5 days prior and resume as soon as recommended after procedure.

## 2015-06-28 NOTE — Telephone Encounter (Signed)
Called refill into [facility] had to leave on pharmacy vm...[name]

## 2015-06-29 ENCOUNTER — Encounter: Payer: Self-pay | Admitting: Internal Medicine

## 2015-07-03 ENCOUNTER — Other Ambulatory Visit: Payer: Self-pay | Admitting: *Deleted

## 2015-07-03 ENCOUNTER — Telehealth: Payer: Self-pay | Admitting: Cardiovascular Disease

## 2015-07-03 MED ORDER — METOPROLOL SUCCINATE ER 50 MG PO TB24: 50.0000 mg | ORAL_TABLET | Freq: Two times a day (BID) | ORAL | Status: DC

## 2015-07-03 MED ORDER — METOPROLOL SUCCINATE ER 50 MG PO TB24: ORAL_TABLET | ORAL | Status: DC

## 2015-07-03 NOTE — Telephone Encounter (Signed)
Called and spoke with Georgina Snell the pharmacist and gave verbal on this as they do not receive escribe prescriptions.

## 2015-07-03 NOTE — Telephone Encounter (Signed)
New message     The pt is completely out Metoprolol    *STAT* If patient is at the pharmacy, call can be transferred to refill team.   1. Which medications need to be refilled? (please list name of each medication and dose if known) Metoprolol 50 mg ER po daily  2. Which pharmacy/location (including street and city if local pharmacy) is medication to be sent to?[facility] the pharmacy at on [address]  3. Do they need a 30 day or 90 day supply? 90 supply   The pharmacist will only be there til 1pm, please fax back ;Fax number [phone]

## 2015-07-05 ENCOUNTER — Other Ambulatory Visit: Payer: Self-pay | Admitting: General Surgery

## 2015-07-05 DIAGNOSIS — E872 Acidosis, unspecified: Secondary | ICD-10-CM

## 2015-07-05 DIAGNOSIS — N6320 Unspecified lump in the left breast, unspecified quadrant: Secondary | ICD-10-CM | POA: Insufficient documentation

## 2015-07-13 ENCOUNTER — Other Ambulatory Visit: Payer: Self-pay | Admitting: *Deleted

## 2015-07-13 MED ORDER — BUSPIRONE HCL 7.5 MG PO TABS: 7.5000 mg | ORAL_TABLET | Freq: Two times a day (BID) | ORAL | Status: DC

## 2015-07-13 NOTE — Telephone Encounter (Signed)
[facility] can't receive electronic rx or fax through system. Printed & faxed manually...[name]

## 2015-07-16 ENCOUNTER — Telehealth: Payer: Self-pay | Admitting: Cardiovascular Disease

## 2015-07-16 ENCOUNTER — Ambulatory Visit (INDEPENDENT_AMBULATORY_CARE_PROVIDER_SITE_OTHER): Payer: Medicare Other | Admitting: Internal Medicine

## 2015-07-16 ENCOUNTER — Encounter: Payer: Self-pay | Admitting: Internal Medicine

## 2015-07-16 VITALS — BP 140/80 | HR 80 | Wt 180.0 lb

## 2015-07-16 DIAGNOSIS — N259 Disorder resulting from impaired renal tubular function, unspecified: Secondary | ICD-10-CM

## 2015-07-16 DIAGNOSIS — I10 Essential (primary) hypertension: Secondary | ICD-10-CM | POA: Diagnosis not present

## 2015-07-16 DIAGNOSIS — M545 Low back pain: Secondary | ICD-10-CM

## 2015-07-16 DIAGNOSIS — Z7901 Long term (current) use of anticoagulants: Secondary | ICD-10-CM | POA: Diagnosis not present

## 2015-07-16 DIAGNOSIS — M79605 Pain in left leg: Secondary | ICD-10-CM

## 2015-07-16 DIAGNOSIS — I4892 Unspecified atrial flutter: Secondary | ICD-10-CM | POA: Diagnosis not present

## 2015-07-16 DIAGNOSIS — N63 Unspecified lump in unspecified breast: Secondary | ICD-10-CM

## 2015-07-16 MED ORDER — OXYBUTYNIN CHLORIDE 5 MG PO TABS: 5.0000 mg | ORAL_TABLET | Freq: Two times a day (BID) | ORAL | Status: DC

## 2015-07-16 MED ORDER — FUROSEMIDE 40 MG PO TABS: 40.0000 mg | ORAL_TABLET | ORAL | Status: DC

## 2015-07-16 MED ORDER — TEMAZEPAM 30 MG PO CAPS
30.0000 mg | ORAL_CAPSULE | Freq: Every day | ORAL | Status: DC
Start: 2015-07-16 — End: 2016-01-25

## 2015-07-16 MED ORDER — KLOR-CON M10 10 MEQ PO TBCR: 10.0000 meq | EXTENDED_RELEASE_TABLET | Freq: Every day | ORAL | Status: DC

## 2015-07-16 MED ORDER — ATORVASTATIN CALCIUM 10 MG PO TABS: 10.0000 mg | ORAL_TABLET | Freq: Every evening | ORAL | Status: DC

## 2015-07-16 MED ORDER — TRIAMCINOLONE ACETONIDE 0.1 % EX OINT: 1.0000 "application " | TOPICAL_OINTMENT | Freq: Two times a day (BID) | CUTANEOUS | Status: DC

## 2015-07-16 MED ORDER — LEVOTHYROXINE SODIUM 25 MCG PO TABS: ORAL_TABLET | ORAL | Status: DC

## 2015-07-16 MED ORDER — BUSPIRONE HCL 7.5 MG PO TABS: 7.5000 mg | ORAL_TABLET | Freq: Two times a day (BID) | ORAL | Status: DC

## 2015-07-16 NOTE — Progress Notes (Signed)
Subjective:  Patient ID: [name], female    DOB: 06-Apr-1925  Age: 82 y.o. MRN: [id number]  CC: No chief complaint on file.   HPI ARMA SCHMUCKER presents for LBP, depression, HTN, dyslipidemia f/u Pt had a L breast lump bx - now is having XRT seeds implanted on July 5th.  Outpatient Prescriptions Prior to Visit  Medication Sig Dispense Refill  . acetaminophen (TYLENOL) 500 MG tablet Take 500 mg by mouth daily as needed for mild pain.     [name] atorvastatin (LIPITOR) 10 MG tablet Take 1 tablet (10 mg total) by mouth every evening. 90 tablet 3  . B Complex-C (B-COMPLEX WITH VITAMIN C) tablet Take 1 tablet by mouth daily.    . benzonatate (TESSALON) 200 MG capsule Take 1 capsule (200 mg total) by mouth 2 (two) times daily as needed for cough. 60 capsule 0  . busPIRone (BUSPAR) 7.5 MG tablet Take 1 tablet (7.5 mg total) by mouth 2 (two) times daily. 60 tablet 5  . Cholecalciferol (VITAMIN D3) 1000 UNITS tablet Take 1,000 Units by mouth daily.      [name] diltiazem (CARDIZEM CD) 120 MG 24 hr capsule TAKE 1 CAPSULE (120 MG TOTAL) BY MOUTH DAILY. 90 capsule 3  . doxycycline (VIBRAMYCIN) 100 MG capsule Take 100 mg by mouth 2 (two) times daily.    . Ferrous Sulfate (IRON) 325 (65 FE) MG TABS Take 1 tablet by mouth daily.    . furosemide (LASIX) 40 MG tablet Take 1 tablet (40 mg total) by mouth as directed. 1 po on Mon-Wed-Fri in am 30 tablet 5  . KLOR-CON M10 10 MEQ tablet Take 1 tablet (10 mEq total) by mouth daily. 30 tablet 3  . levothyroxine (SYNTHROID, LEVOTHROID) 25 MCG tablet TAKE 1 TABLET BY MOUTH ONCE DAILY FOR THYROID (Patient taking differently: Take 25 mcg by mouth daily before breakfast. ) 90 tablet 3  . metoprolol succinate (TOPROL-XL) 50 MG 24 hr tablet Take 1 tablet (50 mg total) by mouth 2 (two) times daily. Take with or immediately following a meal. 180 tablet 2  . Multiple Vitamins-Minerals (ZINC PO) Take 1 tablet by mouth daily.    [name] oxybutynin (DITROPAN) 5 MG tablet Take 1 tablet  (5 mg total) by mouth 2 (two) times daily. 60 tablet 11  . polyethylene glycol (MIRALAX / GLYCOLAX) packet Take 17 g by mouth daily as needed for moderate constipation.     . temazepam (RESTORIL) 30 MG capsule Take 1 capsule (30 mg total) by mouth at bedtime. 30 capsule 3  . triamcinolone ointment (KENALOG) 0.1 % Apply 1 application topically 2 (two) times daily. On L dist shin dry skin area (Patient taking differently: Apply 1 application topically daily. On L dist shin dry skin area) 30 g 1  . vitamin C (ASCORBIC ACID) 500 MG tablet Take 500 mg by mouth daily.    [name] warfarin (COUMADIN) 5 MG tablet TAKE AS DIRECTED BY THERE COUMADIN CLINIC (Patient taking differently: Take 5mg  on Monday and Friday all other days take 2.5mg ) 30 tablet 3   No facility-administered medications prior to visit.    ROS Review of Systems  Constitutional: Negative for chills, activity change, appetite change, fatigue and unexpected weight change.  HENT: Negative for congestion, mouth sores and sinus pressure.   Eyes: Negative for visual disturbance.  Respiratory: Negative for cough and chest tightness.   Gastrointestinal: Negative for nausea and abdominal pain.  Genitourinary: Negative for frequency, difficulty urinating and vaginal pain.  Musculoskeletal: Positive for back pain and gait problem.  Skin: Negative for pallor and rash.  Neurological: Negative for dizziness, tremors, weakness, numbness and headaches.  Psychiatric/Behavioral: Negative for suicidal ideas, confusion and sleep disturbance.    Objective:  BP 140/80 mmHg  Pulse 80  Wt 180 lb (81.647 kg)  SpO2 94%  BP Readings from Last 3 Encounters:  07/16/15 140/80  04/16/15 118/80  03/06/15 110/80    Wt Readings from Last 3 Encounters:  07/16/15 180 lb (81.647 kg)  04/16/15 185 lb (83.915 kg)  03/06/15 184 lb (83.462 kg)    Physical Exam  Constitutional: She appears well-developed. No distress.  HENT:  Head: Normocephalic.  Right Ear:  External ear normal.  Left Ear: External ear normal.  Nose: Nose normal.  Mouth/Throat: Oropharynx is clear and moist.  Eyes: Conjunctivae are normal. Pupils are equal, round, and reactive to light. Right eye exhibits no discharge. Left eye exhibits no discharge.  Neck: Normal range of motion. Neck supple. No JVD present. No tracheal deviation present. No thyromegaly present.  Cardiovascular: Normal rate, regular rhythm and normal heart sounds.   Pulmonary/Chest: No stridor. No respiratory distress. She has no wheezes.  Abdominal: Soft. Bowel sounds are normal. She exhibits no distension and no mass. There is no tenderness. There is no rebound and no guarding.  Musculoskeletal: She exhibits tenderness. She exhibits no edema.  Lymphadenopathy:    She has no cervical adenopathy.  Neurological: She displays normal reflexes. No cranial nerve deficit. She exhibits normal muscle tone. Coordination abnormal.  Skin: No rash noted. No erythema.  Psychiatric: She has a normal mood and affect. Her behavior is normal. Judgment and thought content normal.  LS tender Using a walker Trace edema  Lab Results  Component Value Date   WBC 6.1 02/25/2015   HGB 15.0 02/25/2015   HCT 45.5 02/25/2015   PLT 172 02/25/2015   GLUCOSE 144* 02/25/2015   CHOL 116 06/25/2012   TRIG 82 09/05/2013   HDL 41.50 06/25/2012   LDLDIRECT 110.3 05/24/2008   LDLCALC 66 06/25/2012   ALT 31 02/25/2015   AST 36 02/25/2015   NA 140 02/25/2015   K 3.5 02/25/2015   CL 101 02/25/2015   CREATININE 0.89 02/25/2015   BUN 15 02/25/2015   CO2 27 02/25/2015   TSH 0.51 01/17/2014   INR 2.1 06/07/2015   HGBA1C 6.1* 09/08/2013    Dg Chest 2 View  03/06/2015  CLINICAL DATA:  Cough and congestion for 1 week EXAM: CHEST  2 VIEW COMPARISON:  02/25/2015 FINDINGS: Cardiac shadow is within normal limits. Mild interstitial changes are again noted. No focal infiltrate or sizable effusion is seen. Persistent scarring is noted in the mid  right lung. No new focal abnormality is noted. IMPRESSION: No acute abnormality Electronically Signed   By: Inez Catalina M.D.   On: 03/06/2015 13:44    Assessment & Plan:   There are no diagnoses linked to this encounter. I am having Ms. Quam maintain her cholecalciferol, B-complex with vitamin C, Iron, acetaminophen, polyethylene glycol, vitamin C, Multiple Vitamins-Minerals (ZINC PO), warfarin, diltiazem, furosemide, KLOR-CON M10, levothyroxine, atorvastatin, triamcinolone ointment, benzonatate, oxybutynin, temazepam, metoprolol succinate, doxycycline, and busPIRone.  No orders of the defined types were placed in this encounter.     Follow-up: No Follow-up on file.  Walker Kehr, MD

## 2015-07-16 NOTE — Assessment & Plan Note (Signed)
Metoprolol, Diltiazem

## 2015-07-16 NOTE — Progress Notes (Signed)
Pre visit review using our clinic review tool, if applicable. No additional management support is needed unless otherwise documented below in the visit note. 

## 2015-07-16 NOTE — Assessment & Plan Note (Signed)
Chronic sx's Tylenol prn 

## 2015-07-16 NOTE — Telephone Encounter (Signed)
Pt needed to move Coumadin appt back d/t upcoming procedure.

## 2015-07-16 NOTE — Assessment & Plan Note (Signed)
On Coumadin 

## 2015-07-16 NOTE — Assessment & Plan Note (Signed)
Monitor labs 

## 2015-07-16 NOTE — Telephone Encounter (Signed)
NeW Message  Pt requested to speak w/ RN- would not specify nature of phone call. Please call back and discuss.

## 2015-07-16 NOTE — Assessment & Plan Note (Signed)
Pt is having XRT seeds implanted on July 5th. Per coumadin clinic

## 2015-07-16 NOTE — Telephone Encounter (Signed)
I spoke with pt.  She has questions regarding coumadin clinic appt. I told pt I would forward message to coumadin clinic and someone would call her

## 2015-07-17 ENCOUNTER — Other Ambulatory Visit: Payer: Self-pay

## 2015-07-17 MED ORDER — WARFARIN SODIUM 5 MG PO TABS: ORAL_TABLET | ORAL | Status: DC

## 2015-07-18 ENCOUNTER — Other Ambulatory Visit: Payer: Self-pay | Admitting: *Deleted

## 2015-07-18 ENCOUNTER — Encounter (HOSPITAL_COMMUNITY)
Admission: RE | Admit: 2015-07-18 | Discharge: 2015-07-18 | Disposition: A | Discharge status: Home / Self Care | Payer: Medicare Other | Source: Ambulatory Visit | Attending: General Surgery | Admitting: General Surgery

## 2015-07-18 ENCOUNTER — Encounter (HOSPITAL_COMMUNITY): Payer: Self-pay

## 2015-07-18 DIAGNOSIS — N63 Unspecified lump in breast: Secondary | ICD-10-CM | POA: Insufficient documentation

## 2015-07-18 DIAGNOSIS — Z01812 Encounter for preprocedural laboratory examination: Secondary | ICD-10-CM | POA: Insufficient documentation

## 2015-07-18 LAB — BASIC METABOLIC PANEL
Anion gap: 10 (ref 5–15)
BUN: 15 mg/dL (ref 6–20)
CO2: 24 mmol/L (ref 22–32)
Calcium: 9.4 mg/dL (ref 8.9–10.3)
Chloride: 104 mmol/L (ref 101–111)
Creatinine, Ser: 0.79 mg/dL (ref 0.44–1.00)
GFR calc Af Amer: >60 mL/min (ref >60)
GFR calc non Af Amer: >60 mL/min (ref >60)
Glucose, Bld: 109 mg/dL — ABNORMAL HIGH (ref 65–99)
Potassium: 3.5 mmol/L (ref 3.5–5.1)
Sodium: 138 mmol/L (ref 135–145)

## 2015-07-18 LAB — CBC WITH DIFFERENTIAL/PLATELET
Basophils Absolute: 0 10*3/uL (ref 0.0–0.1)
Basophils Relative: 1 %
Eosinophils Absolute: 0.1 10*3/uL (ref 0.0–0.7)
Eosinophils Relative: 2 %
HCT: 44.9 % (ref 36.0–46.0)
Hemoglobin: 15 g/dL (ref 12.0–15.0)
Lymphocytes Relative: 28 %
Lymphs Abs: 1.4 10*3/uL (ref 0.7–4.0)
MCH: 34.3 pg — ABNORMAL HIGH (ref 26.0–34.0)
MCHC: 33.4 g/dL (ref 30.0–36.0)
MCV: 102.7 fL — ABNORMAL HIGH (ref 78.0–100.0)
Monocytes Absolute: 0.5 10*3/uL (ref 0.1–1.0)
Monocytes Relative: 10 %
Neutro Abs: 3.1 10*3/uL (ref 1.7–7.7)
Neutrophils Relative %: 59 %
Platelets: 191 10*3/uL (ref 150–400)
RBC: 4.37 MIL/uL (ref 3.87–5.11)
RDW: 12.8 % (ref 11.5–15.5)
WBC: 5.2 10*3/uL (ref 4.0–10.5)

## 2015-07-18 MED ORDER — WARFARIN SODIUM 5 MG PO TABS: ORAL_TABLET | ORAL | Status: DC

## 2015-07-18 NOTE — Pre-Procedure Instructions (Signed)
    [name]  07/18/2015      [facility] - [address] [place] [address] Phone: [phone] Fax: [phone]    Your procedure is scheduled on 07-25-2015   Wednesday  .  Report to [facility] Admitting at 10:00 A.M.   Call this number if you have problems the morning of surgery:  [phone]   Remember:  Do not eat food or drink liquids after midnight.   Take these medicines the morning of surgery with A SIP OF WATER Tylenol if needed,buspironr(Buspar),diltiazem(Cardizem),Klor-Con,levothyroxine(synthroid),metoprolol (ToprolXL)            STOP ASPIRIN,ANTIINFLAMATORIES (IBUPROFEN,ALEVE,MOTRIN,ADVIL,GOODY'S POWDERS),HERBAL SUPPLEMENTS,FISH OIL,AND VITAMINS 5-7 DAYS PRIOR TO SURGERY              STOP COUMADIN 5 DAYS PRIOR TO SURGERY   Do not wear jewelry, make-up or nail polish.   Do not wear lotions, powders, or perfumes.  You may NOT wear deoderant.   Do not shave 48 hours prior to surgery .  Do not bring valuables to the hospital.  [facility] is not responsible for any belongings or valuables.  Contacts, dentures or bridgework may not be worn into surgery.  Leave your suitcase in the car.  After surgery it may be brought to your room.  For patients admitted to the hospital, discharge time will be determined by your treatment team.  Patients discharged the day of surgery will not be allowed to drive home.    Special instructions: See attached Sheet for instructions on [employer]   Please read over the following fact sheets that you were given. Surgical Site Infection Prevention

## 2015-07-23 DIAGNOSIS — N649 Disorder of breast, unspecified: Secondary | ICD-10-CM | POA: Diagnosis not present

## 2015-07-25 ENCOUNTER — Encounter (HOSPITAL_COMMUNITY): Payer: Self-pay | Admitting: *Deleted

## 2015-07-25 ENCOUNTER — Ambulatory Visit (HOSPITAL_COMMUNITY): Payer: Medicare Other | Admitting: Certified Registered Nurse Anesthetist

## 2015-07-25 ENCOUNTER — Encounter (HOSPITAL_COMMUNITY)
Admission: RE | Disposition: A | Discharge status: Home / Self Care | Payer: Self-pay | Source: Ambulatory Visit | Attending: General Surgery

## 2015-07-25 ENCOUNTER — Observation Stay (HOSPITAL_COMMUNITY)
Admission: RE | Admit: 2015-07-25 | Discharge: 2015-07-26 | Disposition: A | Discharge status: Home / Self Care | Payer: Medicare Other | Source: Ambulatory Visit | Attending: General Surgery | Admitting: General Surgery

## 2015-07-25 DIAGNOSIS — Z7901 Long term (current) use of anticoagulants: Secondary | ICD-10-CM | POA: Insufficient documentation

## 2015-07-25 DIAGNOSIS — F419 Anxiety disorder, unspecified: Secondary | ICD-10-CM | POA: Diagnosis not present

## 2015-07-25 DIAGNOSIS — E039 Hypothyroidism, unspecified: Secondary | ICD-10-CM | POA: Insufficient documentation

## 2015-07-25 DIAGNOSIS — K219 Gastro-esophageal reflux disease without esophagitis: Secondary | ICD-10-CM | POA: Insufficient documentation

## 2015-07-25 DIAGNOSIS — I10 Essential (primary) hypertension: Secondary | ICD-10-CM | POA: Insufficient documentation

## 2015-07-25 DIAGNOSIS — M1990 Unspecified osteoarthritis, unspecified site: Secondary | ICD-10-CM | POA: Insufficient documentation

## 2015-07-25 DIAGNOSIS — N63 Unspecified lump in unspecified breast: Secondary | ICD-10-CM | POA: Diagnosis present

## 2015-07-25 DIAGNOSIS — D0512 Intraductal carcinoma in situ of left breast: Secondary | ICD-10-CM | POA: Diagnosis not present

## 2015-07-25 DIAGNOSIS — I4891 Unspecified atrial fibrillation: Secondary | ICD-10-CM | POA: Diagnosis not present

## 2015-07-25 DIAGNOSIS — C50912 Malignant neoplasm of unspecified site of left female breast: Secondary | ICD-10-CM | POA: Diagnosis not present

## 2015-07-25 DIAGNOSIS — M545 Low back pain: Secondary | ICD-10-CM | POA: Diagnosis not present

## 2015-07-25 DIAGNOSIS — Z170 Estrogen receptor positive status [ER+]: Secondary | ICD-10-CM | POA: Diagnosis not present

## 2015-07-25 HISTORY — PX: BIOPSY BREAST: PRO8

## 2015-07-25 HISTORY — PX: RADIOACTIVE SEED GUIDED EXCISIONAL BREAST BIOPSY: SHX6490

## 2015-07-25 LAB — PROTIME-INR
INR: 1.25 (ref 0.00–1.49)
Prothrombin Time: 15.8 seconds — ABNORMAL HIGH (ref 11.6–15.2)

## 2015-07-25 LAB — APTT: aPTT: 31 seconds (ref 24–37)

## 2015-07-25 SURGERY — RADIOACTIVE SEED GUIDED BREAST BIOPSY
Anesthesia: General | Site: Breast | Laterality: Left

## 2015-07-25 MED ORDER — PROMETHAZINE HCL 25 MG/ML IJ SOLN: 6.2500 mg | INTRAMUSCULAR | Status: DC | PRN

## 2015-07-25 MED ORDER — PROPOFOL 10 MG/ML IV BOLUS
INTRAVENOUS | Status: AC
  Filled 2015-07-25: qty 20

## 2015-07-25 MED ORDER — PROPOFOL 10 MG/ML IV BOLUS
INTRAVENOUS | Status: DC | PRN
  Administered 2015-07-25: 100 mg via INTRAVENOUS

## 2015-07-25 MED ORDER — POLYETHYLENE GLYCOL 3350 17 G PO PACK: 17.0000 g | PACK | Freq: Every day | ORAL | Status: DC | PRN

## 2015-07-25 MED ORDER — 0.9 % SODIUM CHLORIDE (POUR BTL) OPTIME
TOPICAL | Status: DC | PRN
Start: 2015-07-25 — End: 2015-07-25
  Administered 2015-07-25: 1000 mL

## 2015-07-25 MED ORDER — HYDROCODONE-ACETAMINOPHEN 5-325 MG PO TABS: 1.0000 | ORAL_TABLET | ORAL | Status: DC | PRN

## 2015-07-25 MED ORDER — LIDOCAINE 2% (20 MG/ML) 5 ML SYRINGE
INTRAMUSCULAR | Status: AC
  Filled 2015-07-25: qty 5

## 2015-07-25 MED ORDER — ACETAMINOPHEN 650 MG RE SUPP: 650.0000 mg | Freq: Four times a day (QID) | RECTAL | Status: DC | PRN

## 2015-07-25 MED ORDER — LIDOCAINE HCL (CARDIAC) 20 MG/ML IV SOLN
INTRAVENOUS | Status: DC | PRN
  Administered 2015-07-25: 40 mg via INTRAVENOUS

## 2015-07-25 MED ORDER — TEMAZEPAM 15 MG PO CAPS
30.0000 mg | ORAL_CAPSULE | Freq: Every evening | ORAL | Status: DC | PRN
  Administered 2015-07-25: 30 mg via ORAL
  Filled 2015-07-25: qty 2

## 2015-07-25 MED ORDER — BUPIVACAINE-EPINEPHRINE (PF) 0.25% -1:200000 IJ SOLN
INTRAMUSCULAR | Status: AC
Start: 2015-07-25 — End: 2015-07-25
  Filled 2015-07-25: qty 30

## 2015-07-25 MED ORDER — METOPROLOL SUCCINATE ER 50 MG PO TB24
50.0000 mg | ORAL_TABLET | Freq: Two times a day (BID) | ORAL | Status: DC
  Administered 2015-07-25: 50 mg via ORAL
  Filled 2015-07-25: qty 1

## 2015-07-25 MED ORDER — POTASSIUM CHLORIDE CRYS ER 10 MEQ PO TBCR: 10.0000 meq | EXTENDED_RELEASE_TABLET | Freq: Every day | ORAL | Status: DC

## 2015-07-25 MED ORDER — WARFARIN - PHARMACIST DOSING INPATIENT: Freq: Every day | Status: DC

## 2015-07-25 MED ORDER — BUPIVACAINE-EPINEPHRINE 0.25% -1:200000 IJ SOLN
INTRAMUSCULAR | Status: DC | PRN
  Administered 2015-07-25: 10 mL

## 2015-07-25 MED ORDER — FENTANYL CITRATE (PF) 250 MCG/5ML IJ SOLN
INTRAMUSCULAR | Status: DC | PRN
  Administered 2015-07-25 (×2): 25 ug via INTRAVENOUS

## 2015-07-25 MED ORDER — HYDROMORPHONE HCL 1 MG/ML IJ SOLN
INTRAMUSCULAR | Status: AC
  Administered 2015-07-25: 0.5 mg via INTRAVENOUS
  Filled 2015-07-25: qty 1

## 2015-07-25 MED ORDER — WARFARIN SODIUM 5 MG PO TABS
5.0000 mg | ORAL_TABLET | Freq: Once | ORAL | Status: AC
  Administered 2015-07-25: 5 mg via ORAL
  Filled 2015-07-25: qty 1

## 2015-07-25 MED ORDER — OXYBUTYNIN CHLORIDE 5 MG PO TABS
5.0000 mg | ORAL_TABLET | Freq: Two times a day (BID) | ORAL | Status: DC
  Administered 2015-07-25: 5 mg via ORAL
  Filled 2015-07-25: qty 1

## 2015-07-25 MED ORDER — ACETAMINOPHEN 325 MG PO TABS: 650.0000 mg | ORAL_TABLET | Freq: Four times a day (QID) | ORAL | Status: DC | PRN

## 2015-07-25 MED ORDER — BUSPIRONE HCL 15 MG PO TABS
7.5000 mg | ORAL_TABLET | Freq: Two times a day (BID) | ORAL | Status: DC
  Administered 2015-07-25: 7.5 mg via ORAL
  Filled 2015-07-25: qty 1

## 2015-07-25 MED ORDER — ONDANSETRON 4 MG PO TBDP: 4.0000 mg | ORAL_TABLET | Freq: Four times a day (QID) | ORAL | Status: DC | PRN

## 2015-07-25 MED ORDER — ONDANSETRON HCL 4 MG/2ML IJ SOLN
INTRAMUSCULAR | Status: AC
  Filled 2015-07-25: qty 2

## 2015-07-25 MED ORDER — SODIUM CHLORIDE 0.9 % IV SOLN
INTRAVENOUS | Status: DC
  Administered 2015-07-25: 16:00:00 via INTRAVENOUS

## 2015-07-25 MED ORDER — PHENYLEPHRINE 40 MCG/ML (10ML) SYRINGE FOR IV PUSH (FOR BLOOD PRESSURE SUPPORT)
PREFILLED_SYRINGE | INTRAVENOUS | Status: AC
  Filled 2015-07-25: qty 10

## 2015-07-25 MED ORDER — CEFAZOLIN SODIUM-DEXTROSE 2-4 GM/100ML-% IV SOLN
INTRAVENOUS | Status: AC
  Administered 2015-07-25: 2 g via INTRAVENOUS
  Filled 2015-07-25: qty 100

## 2015-07-25 MED ORDER — ONDANSETRON HCL 4 MG/2ML IJ SOLN: 4.0000 mg | Freq: Four times a day (QID) | INTRAMUSCULAR | Status: DC | PRN

## 2015-07-25 MED ORDER — HYDROMORPHONE HCL 1 MG/ML IJ SOLN
0.2500 mg | INTRAMUSCULAR | Status: DC | PRN
  Administered 2015-07-25: 0.25 mg via INTRAVENOUS
  Administered 2015-07-25: 0.5 mg via INTRAVENOUS
  Administered 2015-07-25: 0.25 mg via INTRAVENOUS

## 2015-07-25 MED ORDER — LACTATED RINGERS IV SOLN
INTRAVENOUS | Status: DC
  Administered 2015-07-25: 11:00:00 via INTRAVENOUS

## 2015-07-25 MED ORDER — ONDANSETRON HCL 4 MG/2ML IJ SOLN
INTRAMUSCULAR | Status: DC | PRN
  Administered 2015-07-25: 4 mg via INTRAVENOUS

## 2015-07-25 MED ORDER — FENTANYL CITRATE (PF) 250 MCG/5ML IJ SOLN
INTRAMUSCULAR | Status: AC
  Filled 2015-07-25: qty 5

## 2015-07-25 MED ORDER — DILTIAZEM HCL ER COATED BEADS 120 MG PO CP24
120.0000 mg | ORAL_CAPSULE | Freq: Every day | ORAL | Status: DC
  Filled 2015-07-25: qty 1

## 2015-07-25 SURGICAL SUPPLY — 51 items
APPLIER CLIP 9.375 MED OPEN (MISCELLANEOUS) ×3
APR CLP MED 9.3 20 MLT OPN (MISCELLANEOUS) ×1
BANDAGE ACE 4X5 VEL STRL LF (GAUZE/BANDAGES/DRESSINGS) ×2 IMPLANT
BINDER BREAST LRG (GAUZE/BANDAGES/DRESSINGS) IMPLANT
BINDER BREAST XLRG (GAUZE/BANDAGES/DRESSINGS) ×2 IMPLANT
BLADE SURG 10 STRL SS (BLADE) ×3 IMPLANT
BLADE SURG 15 STRL LF DISP TIS (BLADE) ×1 IMPLANT
BLADE SURG 15 STRL SS (BLADE) ×3
CANISTER SUCTION 2500CC (MISCELLANEOUS) IMPLANT
CHLORAPREP W/TINT 26ML (MISCELLANEOUS) ×3 IMPLANT
CLIP APPLIE 9.375 MED OPEN (MISCELLANEOUS) IMPLANT
CLOSURE WOUND 1/2 X4 (GAUZE/BANDAGES/DRESSINGS) ×1
COVER PROBE W GEL 5X96 (DRAPES) ×3 IMPLANT
COVER SURGICAL LIGHT HANDLE (MISCELLANEOUS) ×3 IMPLANT
DEVICE DUBIN SPECIMEN MAMMOGRA (MISCELLANEOUS) ×3 IMPLANT
DRAPE CHEST BREAST 15X10 FENES (DRAPES) ×3 IMPLANT
DRAPE UTILITY XL STRL (DRAPES) ×3 IMPLANT
ELECT COATED BLADE 2.86 ST (ELECTRODE) ×3 IMPLANT
ELECT REM PT RETURN 9FT ADLT (ELECTROSURGICAL) ×3
ELECTRODE REM PT RTRN 9FT ADLT (ELECTROSURGICAL) ×1 IMPLANT
GLOVE BIO SURGEON STRL SZ7 (GLOVE) ×3 IMPLANT
GLOVE BIOGEL PI IND STRL 7.5 (GLOVE) ×1 IMPLANT
GLOVE BIOGEL PI INDICATOR 7.5 (GLOVE) ×2
GOWN STRL REUS W/ TWL LRG LVL3 (GOWN DISPOSABLE) ×2 IMPLANT
GOWN STRL REUS W/TWL LRG LVL3 (GOWN DISPOSABLE) ×6
ILLUMINATOR WAVEGUIDE N/F (MISCELLANEOUS) IMPLANT
KIT BASIN OR (CUSTOM PROCEDURE TRAY) ×3 IMPLANT
KIT MARKER MARGIN INK (KITS) ×3 IMPLANT
LIGHT WAVEGUIDE WIDE FLAT (MISCELLANEOUS) IMPLANT
LIQUID BAND (GAUZE/BANDAGES/DRESSINGS) ×3 IMPLANT
MARKER SKIN DUAL TIP RULER LAB (MISCELLANEOUS) ×3 IMPLANT
NDL HYPO 25X1 1.5 SAFETY (NEEDLE) ×1 IMPLANT
NEEDLE HYPO 25X1 1.5 SAFETY (NEEDLE) ×3 IMPLANT
NS IRRIG 1000ML POUR BTL (IV SOLUTION) IMPLANT
PACK SURGICAL SETUP 50X90 (CUSTOM PROCEDURE TRAY) ×3 IMPLANT
PENCIL BUTTON HOLSTER BLD 10FT (ELECTRODE) ×3 IMPLANT
SPONGE LAP 18X18 X RAY DECT (DISPOSABLE) ×3 IMPLANT
STRIP CLOSURE SKIN 1/2X4 (GAUZE/BANDAGES/DRESSINGS) ×2 IMPLANT
SUT MNCRL AB 4-0 PS2 18 (SUTURE) ×3 IMPLANT
SUT SILK 2 0 SH (SUTURE) IMPLANT
SUT VIC AB 2-0 SH 27 (SUTURE) ×3
SUT VIC AB 2-0 SH 27XBRD (SUTURE) ×1 IMPLANT
SUT VIC AB 3-0 SH 27 (SUTURE) ×3
SUT VIC AB 3-0 SH 27X BRD (SUTURE) ×1 IMPLANT
SYR BULB 3OZ (MISCELLANEOUS) ×3 IMPLANT
SYR CONTROL 10ML LL (SYRINGE) ×3 IMPLANT
TOWEL OR 17X24 6PK STRL BLUE (TOWEL DISPOSABLE) ×3 IMPLANT
TOWEL OR 17X26 10 PK STRL BLUE (TOWEL DISPOSABLE) ×3 IMPLANT
TUBE CONNECTING 12'X1/4 (SUCTIONS)
TUBE CONNECTING 12X1/4 (SUCTIONS) IMPLANT
YANKAUER SUCT BULB TIP NO VENT (SUCTIONS) IMPLANT

## 2015-07-25 NOTE — H&P (Signed)
   66 yof who has afib on coumadin noted a left breast mass. this is not really causing symptoms. she has no prior history. she underwent evaluation with mm and [place] that shows a new oval mass in the left breast with a 3.4 cm mass no discharge noted. this underwent core biopsy and is a papillary lesion.   Other Problems  Arthritis Atrial Fibrillation Back Pain Gastroesophageal Reflux Disease High blood pressure  Past Surgical History  Spinal Surgery Midback Tonsillectomy  Diagnostic Studies History  Mammogram within last year 1-3 years ago Pap Smear >5 years ago  Allergies  Percocet *ANALGESICS - OPIOID* Macrodantin *URINARY ANTI-INFECTIVES*  Medication History  Temazepam (30MG  Capsule, Oral daily) Active. Atorvastatin Calcium (10MG  Tablet, Oral daily) Active. Klor-Con M20 [facility] Tablet ER, Oral daily) Active. Metoprolol Succinate ER (50MG  Tablet ER 24HR, Oral daily) Active. Warfarin Sodium (5MG  Tablet, Oral daily) Active. Pantoprazole Sodium (40MG  Tablet DR, Oral daily) Active. Levothyroxine Sodium (25MCG Tablet, Oral daily) Active. Diltiazem HCl ER Coated Beads (120MG  Capsule ER 24HR, Oral daily) Active. Medications Reconciled  Social History  Caffeine use Coffee. No drug use Tobacco use Never smoker.  Family History Arthritis Father.  Review of Systems  General Not Present- Appetite Loss, Chills, Fatigue, Fever, Night Sweats, Weight Gain and Weight Loss. HEENT Present- Seasonal Allergies. Not Present- Earache, Hearing Loss, Hoarseness, Nose Bleed, Oral Ulcers, Ringing in the Ears, Sinus Pain, Sore Throat, Visual Disturbances, Wears glasses/contact lenses and Yellow Eyes. Breast Present- Breast Mass. Not Present- Breast Pain, Nipple Discharge and Skin Changes. Cardiovascular Present- Swelling of Extremities. Not Present- Chest Pain, Difficulty Breathing Lying Down, Leg Cramps, Palpitations, Rapid Heart Rate and Shortness of Breath. Female  Genitourinary Not Present- Frequency, Nocturia, Painful Urination, Pelvic Pain and Urgency. Musculoskeletal Present- Back Pain. Not Present- Joint Pain, Joint Stiffness, Muscle Pain, Muscle Weakness and Swelling of Extremities.  Vitals  Weight: 181 lb Height: 63in Body Surface Area: 1.85 m Body Mass Index: 32.06 kg/m  Temp.: 58F(Temporal)  Pulse: 79 (Regular)  BP: 126/80 (Sitting, Left Arm, Standard) Physical Exam  General Mental Status-Alert. Orientation-Oriented X3. Chest and Lung Exam Chest and lung exam reveals -on auscultation, normal breath sounds, no adventitious sounds and normal vocal resonance. Breast Nipples-No Discharge. Cardiovascular Cardiovascular examination reveals -normal heart sounds, regular rate and rhythm with no murmurs. Lymphatic Head & Neck General Head & Neck Lymphatics: Bilateral - Description - Normal. Axillary General Axillary Region: Bilateral - Description - Normal. Note: no [place] adenopathy   Assessment & Plan  LEFT BREAST MASS (N63) Story: Left breast mass excisional biopsy I recommended excision of this mass due to size and concern on exam although path is benign. I think she can undergo surgery. will keep her overnight, will plan on getting cards recs prior to surgery as well. we discussed risks, recovery associated with this and have elected to proceed.

## 2015-07-25 NOTE — Progress Notes (Signed)
Spoke with Dr. Donne Hazel and he stated it was ok to give the ancef

## 2015-07-25 NOTE — Interval H&P Note (Signed)
History and Physical Interval Note:  07/25/2015 11:44 AM  [name]  has presented today for surgery, with the diagnosis of left breast mass  The various methods of treatment have been discussed with the patient and family. After consideration of risks, benefits and other options for treatment, the patient has consented to  Procedure(s): LEFT RADIOACTIVE SEED GUIDED EXCISIONAL BREAST BIOPSY (Left) as a surgical intervention .  The patient's history has been reviewed, patient examined, no change in status, stable for surgery.  I have reviewed the patient's chart and labs.  Questions were answered to the patient's satisfaction.     Vasil Juhasz

## 2015-07-25 NOTE — Anesthesia Procedure Notes (Signed)
Procedure Name: LMA Insertion Date/Time: 07/25/2015 12:17 PM Performed by: Merdis Delay Pre-anesthesia Checklist: Patient identified, Emergency Drugs available, Suction available, Patient being monitored and Timeout performed Patient Re-evaluated:Patient Re-evaluated prior to inductionOxygen Delivery Method: Circle system utilized Preoxygenation: Pre-oxygenation with 100% oxygen Intubation Type: IV induction LMA: LMA inserted LMA Size: 4.0 Number of attempts: 1 Placement Confirmation: positive ETCO2,  CO2 detector and breath sounds checked- equal and bilateral Tube secured with: Tape Dental Injury: Teeth and Oropharynx as per pre-operative assessment

## 2015-07-25 NOTE — Care Management Obs Status (Signed)
[facility] NOTIFICATION   Patient Details  Name: YUKIYE MCMELLON MRN: [id number] Date of Birth: [birth year]   Medicare Observation Status Notification Given:  Yes (Medicare observation procedure)    Marilu Favre, RN 07/25/2015, 3:38 PM

## 2015-07-25 NOTE — Transfer of Care (Signed)
Immediate Anesthesia Transfer of Care Note  Patient: [name]  Procedure(s) Performed: Procedure(s): LEFT RADIOACTIVE SEED GUIDED EXCISIONAL BREAST BIOPSY (Left)  Patient Location: PACU  Anesthesia Type:General  Level of Consciousness: oriented and patient cooperative, drowsy  Airway & Oxygen Therapy: Patient Spontanous Breathing  Post-op Assessment: Report given to RN and Post -op Vital signs reviewed and stable  Post vital signs: Reviewed and stable  Last Vitals:  Filed Vitals:   07/25/15 1015 07/25/15 1254  BP: 178/96 157/79  Pulse: 88   Temp: 36.6 C 36.7 C  Resp: 20 17    Last Pain: There were no vitals filed for this visit.       Complications: No apparent anesthesia complications

## 2015-07-25 NOTE — Progress Notes (Signed)
ANTICOAGULATION CONSULT NOTE - Initial Consult  Pharmacy Consult for Coumadin Indication: atrial fibrillation  Allergies  Allergen Reactions  . Zosyn [Piperacillin Sod-Tazobactam So] Rash and Other (See Comments)    Morbilliform eruption with itching  . Atenolol Nausea And Vomiting  . Clarithromycin Nausea And Vomiting  . Codeine Sulfate Nausea Only  . Levaquin [Levofloxacin] Nausea Only  . Macrodantin Other (See Comments)    Reaction unknown  . Percocet [Oxycodone-Acetaminophen] Nausea And Vomiting    Patient Measurements: Height: 5\' 3"  (160 cm) Weight: 180 lb (81.647 kg) IBW/kg (Calculated) : 52.4  Vital Signs: Temp: 97.3 F (36.3 C) (07/05 1430) Temp Source: Oral (07/05 1430) BP: 135/72 mmHg (07/05 1430) Pulse Rate: 60 (07/05 1430)  Labs:  Recent Labs  07/25/15 1016  APTT 31  LABPROT 15.8*  INR 1.25    Estimated Creatinine Clearance: 50.1 mL/min (by C-G formula based on Cr of 0.79).   Medical History: Past Medical History  Diagnosis Date  . Hypertension   . LBP (low back pain)     severe lumbar spondylosis s/p L3/L4,L4/L5 fusion 12/10  . Osteopenia   . Hypothyroidism   . Chronic constipation   . History of colitis     DX  2003  WITH ISCHEMIC COLITIS  . History of GI bleed     2011--- UPPER SECONARY GASTRIC ULCER FROM NSAID USE  . History of Helicobacter pylori infection     2011  . GERD (gastroesophageal reflux disease)   . H/O hiatal hernia   . Diverticulosis of colon   . History of ulcer of lower limb     2012   RIGHT LOWER EXTREMITIY--  STATSIS ULCER  . Bilateral edema of lower extremity   . Persistent atrial fibrillation ([place])     CARDIOLOGIST-   DR MCALANY  . Lumbar spondylolysis   . Anticoagulated on Coumadin   . Hyperlipidemia   . Open wound of left lower leg   . Iron deficiency anemia 2011    elevated MCV  . OA (osteoarthritis)     "hands" (08/25/2013)  . History of diverticulitis of colon     perferated diverticulitis w/ abscess  08-25-2013 drain placed --  resolved without surgical intervention  . History of cellulitis     LEFT LOWER LEG --  SEPT 2015  . RBBB     at fib  . Anxiety     denies    Assessment: 82 year old female on Coumadin PTA for Afib s/p left breast mass biopsy, now to resume Coumadin INR = 1.25 Dose PTA (per last anti-coag visit) = 2.5 mg po daily except 5 mg Monday and Friday   Goal of Therapy:  INR 2-3 Monitor platelets by anticoagulation protocol: Yes   Plan:  Coumadin 5 mg po x 1 dose at 1800 pm Daily INR  Thank you Anette Guarneri, PharmD [phone]  07/25/2015,2:55 PM

## 2015-07-25 NOTE — Op Note (Signed)
Preoperative diagnoses:left breast mass Postoperative diagnosis: Same as above Procedure Left breast seed guided excisional biopsy Surgeon: Dr. Serita Grammes Anesthesia: Gen. Estimated blood loss: Minimal Complications: None Drains: None Specimens:left breast tissue marked with paint Sponge and needle count correct at completion Disposition to recovery stable  Indications: This is an 75 yof who presents with large left breast mass that is adherent to the skin. We discussed options and elected for seed guided excision. She had seed placed prior to surgery and I had her mm in the OR.   Procedure:After informed consent was obtained she was then taken to the operating room. She was given cefazolin. Sequential compression devices on her legs. She was placed under general anesthesia without complication. Her left breast was then prepped and draped in the standard sterile surgical fashion. A surgical timeout was then performed.  I located the radioactive seed with the neoprobe.I infiltrated marcaine in this region. I ended up making an elliptical incision overlying the mass as it felt adherent. I then used the neoprobe to guide the excision of the seed and surrounding tissue. . This was confirmed by the neoprobe. This was painted. This was then taken for mammogram which confirmed removal of the clip and the seed. This was confirmed by radiology. This was then sent to pathology. Hemostasis was observed. I closed the breast tissue with a 2-0 Vicryl. The dermis was closed with 3-0 Vicryl the skin with 4-0 Monocryl. Dermabond was placed. She was transferred to recovery stable.

## 2015-07-25 NOTE — Anesthesia Preprocedure Evaluation (Signed)
Anesthesia Evaluation  Patient identified by MRN, date of birth, ID band Patient awake    Reviewed: Allergy & Precautions, NPO status , Patient's Chart, lab work & pertinent test results  Airway Mallampati: II  TM Distance: >3 FB Neck ROM: Full    Dental no notable dental hx. (+) Dental Advisory Given   Pulmonary neg pulmonary ROS,    Pulmonary exam normal breath sounds clear to auscultation       Cardiovascular hypertension, Pt. on medications + Peripheral Vascular Disease  Normal cardiovascular exam+ dysrhythmias Atrial Fibrillation  Rhythm:Regular Rate:Normal     Neuro/Psych PSYCHIATRIC DISORDERS Anxiety negative neurological ROS     GI/Hepatic Neg liver ROS, hiatal hernia, GERD  Medicated,  Endo/Other  Hypothyroidism   Renal/GU negative Renal ROS  negative genitourinary   Musculoskeletal negative musculoskeletal ROS (+)   Abdominal   Peds negative pediatric ROS (+)  Hematology negative hematology ROS (+)   Anesthesia Other Findings   Reproductive/Obstetrics negative OB ROS                             Anesthesia Physical  Anesthesia Plan  ASA: III  Anesthesia Plan: General   Post-op Pain Management:    Induction: Intravenous  Airway Management Planned: LMA  Additional Equipment:   Intra-op Plan:   Post-operative Plan: Extubation in OR  Informed Consent: I have reviewed the patients History and Physical, chart, labs and discussed the procedure including the risks, benefits and alternatives for the proposed anesthesia with the patient or authorized representative who has indicated his/her understanding and acceptance.   Dental advisory given  Plan Discussed with: CRNA and Surgeon  Anesthesia Plan Comments:         Anesthesia Quick Evaluation

## 2015-07-25 NOTE — Anesthesia Postprocedure Evaluation (Signed)
Anesthesia Post Note  Patient: [name]  Procedure(s) Performed: Procedure(s) (LRB): LEFT RADIOACTIVE SEED GUIDED EXCISIONAL BREAST BIOPSY (Left)  Patient location during evaluation: PACU Anesthesia Type: General Level of consciousness: sedated Pain management: pain level controlled Vital Signs Assessment: post-procedure vital signs reviewed and stable Respiratory status: spontaneous breathing and respiratory function stable Cardiovascular status: stable Anesthetic complications: no    Last Vitals:  Filed Vitals:   07/25/15 1350 07/25/15 1357  BP:  140/78  Pulse: 56 52  Temp:    Resp: 15 15    Last Pain:  Filed Vitals:   07/25/15 1358  PainSc: 4                  Davyn Morandi DANIEL

## 2015-07-26 ENCOUNTER — Encounter (HOSPITAL_COMMUNITY): Payer: Self-pay | Admitting: General Surgery

## 2015-07-26 DIAGNOSIS — I10 Essential (primary) hypertension: Secondary | ICD-10-CM | POA: Diagnosis not present

## 2015-07-26 DIAGNOSIS — I4891 Unspecified atrial fibrillation: Secondary | ICD-10-CM | POA: Diagnosis not present

## 2015-07-26 DIAGNOSIS — K219 Gastro-esophageal reflux disease without esophagitis: Secondary | ICD-10-CM | POA: Diagnosis not present

## 2015-07-26 DIAGNOSIS — F419 Anxiety disorder, unspecified: Secondary | ICD-10-CM | POA: Diagnosis not present

## 2015-07-26 DIAGNOSIS — D0512 Intraductal carcinoma in situ of left breast: Secondary | ICD-10-CM | POA: Diagnosis not present

## 2015-07-26 DIAGNOSIS — E039 Hypothyroidism, unspecified: Secondary | ICD-10-CM | POA: Diagnosis not present

## 2015-07-26 LAB — PROTIME-INR
INR: 1.19 (ref 0.00–1.49)
Prothrombin Time: 15.2 seconds (ref 11.6–15.2)

## 2015-07-26 NOTE — Discharge Instructions (Signed)
[place] Office Phone Number [phone]   POST OP INSTRUCTIONS  Always review your discharge instruction sheet given to you by the facility where your surgery was performed.  IF YOU HAVE DISABILITY OR FAMILY LEAVE FORMS, YOU MUST BRING THEM TO THE OFFICE FOR PROCESSING.  DO NOT GIVE THEM TO YOUR DOCTOR.  1. A prescription for pain medication may be given to you upon discharge.  Take your pain medication as prescribed, if needed.  If narcotic pain medicine is not needed, then you may take acetaminophen (Tylenol), naprosyn (Alleve) or ibuprofen (Advil) as needed. 2. Take your usually prescribed medications unless otherwise directed 3. If you need a refill on your pain medication, please contact your pharmacy.  They will contact our office to request authorization.  Prescriptions will not be filled after 5pm or on week-ends. 4. You should eat very light the first 24 hours after surgery, such as soup, crackers, pudding, etc.  Resume your normal diet the day after surgery. 5. Most patients will experience some swelling and bruising in the breast.  Ice packs and a good support bra will help.  Wear the breast binder provided or a sports bra for 72 hours day and night.  After that wear a sports bra during the day until you return to the office. Swelling and bruising can take several days to resolve.  6. It is common to experience some constipation if taking pain medication after surgery.  Increasing fluid intake and taking a stool softener will usually help or prevent this problem from occurring.  A mild laxative (Milk of Magnesia or Miralax) should be taken according to package directions if there are no bowel movements after 48 hours. 7. Unless discharge instructions indicate otherwise, you may remove your bandages 48 hours after surgery and you may shower at that time.  You may have steri-strips (small skin tapes) in place directly over the incision.  These strips should be left on the  skin for 7-10 days and will come off on their own.  If your surgeon used skin glue on the incision, you may shower in 24 hours.  The glue will flake off over the next 2-3 weeks.  Any sutures or staples will be removed at the office during your follow-up visit. 8. ACTIVITIES:  You may resume regular daily activities (gradually increasing) beginning the next day.  Wearing a good support bra or sports bra minimizes pain and swelling.  You may have sexual intercourse when it is comfortable. a. You may drive when you no longer are taking prescription pain medication, you can comfortably wear a seatbelt, and you can safely maneuver your car and apply brakes. b. RETURN TO WORK:  ______________________________________________________________________________________ 9. You should see your doctor in the office for a follow-up appointment approximately two weeks after your surgery.  Your doctors nurse will typically make your follow-up appointment when she calls you with your pathology report.  Expect your pathology report 3-4 business days after your surgery.  You may call to check if you do not hear from [place] after three days. 10. OTHER INSTRUCTIONS: _______________________________________________________________________________________________ _____________________________________________________________________________________________________________________________________ _____________________________________________________________________________________________________________________________________ _____________________________________________________________________________________________________________________________________  WHEN TO CALL DR WAKEFIELD: 1. Fever over 101.0 2. Nausea and/or vomiting. 3. Extreme swelling or bruising. 4. Continued bleeding from incision. 5. Increased pain, redness, or drainage from the incision.  The clinic staff is available to answer your questions during regular  business hours.  Please dont hesitate to call and ask to speak to one of the nurses for clinical concerns.  If you have a medical emergency, go to the nearest emergency room or call 911.  A surgeon from [facility] is always on call at the hospital.  For further questions, please visit [email or web address]     Information on my medicine - Coumadin   (Warfarin)  This medication education was reviewed with me or my healthcare representative as part of my discharge preparation.  The pharmacist that spoke with me during my hospital stay was:  Brain Hilts, [facility]  Why was Coumadin prescribed for you? Coumadin was prescribed for you because you have a blood clot or a medical condition that can cause an increased risk of forming blood clots. Blood clots can cause serious health problems by blocking the flow of blood to the heart, lung, or brain. Coumadin can prevent harmful blood clots from forming. As a reminder your indication for Coumadin is:   Select from menu  What test will check on my response to Coumadin? While on Coumadin (warfarin) you will need to have an INR test regularly to ensure that your dose is keeping you in the desired range. The INR (international normalized ratio) number is calculated from the result of the laboratory test called prothrombin time (PT).  If an INR APPOINTMENT HAS NOT ALREADY BEEN MADE FOR YOU please schedule an appointment to have this lab work done by your health care provider within 7 days. Your INR goal is usually a number between:  2 to 3 or your provider may give you a more narrow range like 2-2.5.  Ask your health care provider during an office visit what your goal INR is.  What  do you need to  know  About  COUMADIN? Take Coumadin (warfarin) exactly as prescribed by your healthcare provider about the same time each day.  DO NOT stop taking without talking to the doctor who prescribed the medication.  Stopping without other  blood clot prevention medication to take the place of Coumadin may increase your risk of developing a new clot or stroke.  Get refills before you run out.  What do you do if you miss a dose? If you miss a dose, take it as soon as you remember on the same day then continue your regularly scheduled regimen the next day.  Do not take two doses of Coumadin at the same time.  Important Safety Information A possible side effect of Coumadin (Warfarin) is an increased risk of bleeding. You should call your healthcare provider right away if you experience any of the following: ? Bleeding from an injury or your nose that does not stop. ? Unusual colored urine (red or dark brown) or unusual colored stools (red or black). ? Unusual bruising for unknown reasons. ? A serious fall or if you hit your head (even if there is no bleeding).  Some foods or medicines interact with Coumadin (warfarin) and might alter your response to warfarin. To help avoid this: ? Eat a balanced diet, maintaining a consistent amount of Vitamin K. ? Notify your provider about major diet changes you plan to make. ? Avoid alcohol or limit your intake to 1 drink for women and 2 drinks for men per day. (1 drink is 5 oz. wine, 12 oz. beer, or 1.5 oz. liquor.)  Make sure that ANY health care provider who prescribes medication for you knows that you are taking Coumadin (warfarin).  Also make sure the healthcare provider who is monitoring your Coumadin knows when you have started a new medication  including herbals and non-prescription products.  Coumadin (Warfarin)  Major Drug Interactions  Increased Warfarin Effect Decreased Warfarin Effect  Alcohol (large quantities) Antibiotics (esp. Septra/Bactrim, Flagyl, Cipro) Amiodarone (Cordarone) Aspirin (ASA) Cimetidine (Tagamet) Megestrol (Megace) NSAIDs (ibuprofen, naproxen, etc.) Piroxicam (Feldene) Propafenone (Rythmol SR) Propranolol (Inderal) Isoniazid (INH) Posaconazole  (Noxafil) Barbiturates (Phenobarbital) Carbamazepine (Tegretol) Chlordiazepoxide (Librium) Cholestyramine (Questran) Griseofulvin Oral Contraceptives Rifampin Sucralfate (Carafate) Vitamin K   Coumadin (Warfarin) Major Herbal Interactions  Increased Warfarin Effect Decreased Warfarin Effect  Garlic Ginseng Ginkgo biloba Coenzyme Q10 Green tea St. Johns wort    Coumadin (Warfarin) FOOD Interactions  Eat a consistent number of servings per week of foods HIGH in Vitamin K (1 serving =  cup)  Collards (cooked, or boiled & drained) Kale (cooked, or boiled & drained) Mustard greens (cooked, or boiled & drained) Parsley *serving size only =  cup Spinach (cooked, or boiled & drained) Swiss chard (cooked, or boiled & drained) Turnip greens (cooked, or boiled & drained)  Eat a consistent number of servings per week of foods MEDIUM-HIGH in Vitamin K (1 serving = 1 cup)  Asparagus (cooked, or boiled & drained) Broccoli (cooked, boiled & drained, or raw & chopped) Brussel sprouts (cooked, or boiled & drained) *serving size only =  cup Lettuce, raw (green leaf, endive, romaine) Spinach, raw Turnip greens, raw & chopped   These websites have more information on Coumadin (warfarin):  FailFactory.se; VeganReport.com.au;

## 2015-07-26 NOTE — Discharge Summary (Signed)
Physician Discharge Summary  Patient ID: [name] MRN: [id number] DOB/AGE: 1925/05/08 82 y.o.  Admit date: 07/25/2015 Discharge date: 07/26/2015  Admission Diagnoses: Left breast mass afib  Discharge Diagnoses:  Active Problems:   Breast mass in female   Discharged Condition: good  Hospital Course: 26 yof with large left breast mass impacting her skin.  She was taken to the OR and underwent seed guided excisional biopsy.  She did well from this and remained overnight for observation. She has done well and will be discharged this am.  Consults: None  Significant Diagnostic Studies: none  Treatments: surgery: left breast mass excision  Discharge Exam: Blood pressure 146/83, pulse 86, temperature 98.2 F (36.8 C), temperature source Oral, resp. rate 16, height 5\' 3"  (1.6 m), weight 81.647 kg (180 lb), SpO2 95 %. Incision/Wound:incision clean without hematoma  Disposition: 01-Home or Self Care     Medication List    TAKE these medications        acetaminophen 500 MG tablet  Commonly known as:  TYLENOL  Take 500 mg by mouth daily as needed for mild pain.     atorvastatin 10 MG tablet  Commonly known as:  LIPITOR  Take 1 tablet (10 mg total) by mouth every evening.     B-complex with vitamin C tablet  Take 1 tablet by mouth daily.     benzonatate 200 MG capsule  Commonly known as:  TESSALON  Take 1 capsule (200 mg total) by mouth 2 (two) times daily as needed for cough.     busPIRone 7.5 MG tablet  Commonly known as:  BUSPAR  Take 1 tablet (7.5 mg total) by mouth 2 (two) times daily.     cholecalciferol 1000 units tablet  Commonly known as:  VITAMIN D  Take 1,000 Units by mouth daily.     diltiazem 120 MG 24 hr capsule  Commonly known as:  CARDIZEM CD  TAKE 1 CAPSULE (120 MG TOTAL) BY MOUTH DAILY.     doxycycline 100 MG capsule  Commonly known as:  VIBRAMYCIN  Take 100 mg by mouth 2 (two) times daily.     furosemide 40 MG tablet  Commonly known as:   LASIX  Take 1 tablet (40 mg total) by mouth as directed. 1 po on Mon-Wed-Fri in am     Iron 325 (65 Fe) MG Tabs  Take 1 tablet by mouth daily.     KLOR-CON M10 10 MEQ tablet  Generic drug:  potassium chloride  Take 1 tablet (10 mEq total) by mouth daily.     levothyroxine 25 MCG tablet  Commonly known as:  SYNTHROID, LEVOTHROID  TAKE 1 TABLET BY MOUTH ONCE DAILY FOR THYROID     metoprolol succinate 50 MG 24 hr tablet  Commonly known as:  TOPROL-XL  Take 1 tablet (50 mg total) by mouth 2 (two) times daily. Take with or immediately following a meal.     oxybutynin 5 MG tablet  Commonly known as:  DITROPAN  Take 1 tablet (5 mg total) by mouth 2 (two) times daily.     polyethylene glycol packet  Commonly known as:  MIRALAX / GLYCOLAX  Take 17 g by mouth daily as needed for moderate constipation.     temazepam 30 MG capsule  Commonly known as:  RESTORIL  Take 1 capsule (30 mg total) by mouth at bedtime.     triamcinolone ointment 0.1 %  Commonly known as:  KENALOG  Apply 1 application topically 2 (two) times  daily. On L dist shin dry skin area     vitamin C 500 MG tablet  Commonly known as:  ASCORBIC ACID  Take 500 mg by mouth daily.     warfarin 5 MG tablet  Commonly known as:  COUMADIN  TAKE AS DIRECTED BY THERE COUMADIN CLINIC     ZINC PO  Take 1 tablet by mouth daily.           Follow-up Information    Follow up with [facility], MD In 3 weeks.   Specialty:  General Surgery   Contact information:   [address] [place] [address] [phone]       Signed: Rolm Bookbinder 07/26/2015, 7:18 AM

## 2015-07-26 NOTE — Progress Notes (Signed)
Discharge papers gone over with pt. And her family. No prescriptions to give to pt. IV taken out. NO questions/complaints. Pt .d/c'd successfully via w/c.

## 2015-07-27 ENCOUNTER — Telehealth: Payer: Self-pay

## 2015-07-27 NOTE — Telephone Encounter (Signed)
Pt is on TCM list. Pt was dc after radioactive seed implant for left breast mass.   Pt to follow up with oncology.

## 2015-07-31 ENCOUNTER — Ambulatory Visit (INDEPENDENT_AMBULATORY_CARE_PROVIDER_SITE_OTHER): Payer: Medicare Other | Admitting: Pharmacist

## 2015-07-31 DIAGNOSIS — Z5181 Encounter for therapeutic drug level monitoring: Secondary | ICD-10-CM | POA: Diagnosis not present

## 2015-07-31 DIAGNOSIS — I4892 Unspecified atrial flutter: Secondary | ICD-10-CM | POA: Diagnosis not present

## 2015-07-31 LAB — POCT INR: INR: 1.4

## 2015-08-02 ENCOUNTER — Other Ambulatory Visit: Payer: Self-pay | Admitting: Oncology

## 2015-08-02 DIAGNOSIS — C50912 Malignant neoplasm of unspecified site of left female breast: Secondary | ICD-10-CM

## 2015-08-03 ENCOUNTER — Telehealth: Payer: Self-pay | Admitting: Oncology

## 2015-08-03 ENCOUNTER — Other Ambulatory Visit: Payer: Self-pay | Admitting: Oncology

## 2015-08-03 ENCOUNTER — Encounter: Payer: Self-pay | Admitting: Oncology

## 2015-08-03 NOTE — Telephone Encounter (Signed)
Telephone call to the patient. Appointment scheduled with [facility] on 7/21 at [place]. Patient aware to arrive 30 minutes early. Demographics verified. Letter to the referring.

## 2015-08-06 DIAGNOSIS — D0359 Melanoma in situ of other part of trunk: Secondary | ICD-10-CM | POA: Diagnosis not present

## 2015-08-06 DIAGNOSIS — D1801 Hemangioma of skin and subcutaneous tissue: Secondary | ICD-10-CM | POA: Diagnosis not present

## 2015-08-06 DIAGNOSIS — L821 Other seborrheic keratosis: Secondary | ICD-10-CM | POA: Diagnosis not present

## 2015-08-06 DIAGNOSIS — L812 Freckles: Secondary | ICD-10-CM | POA: Diagnosis not present

## 2015-08-06 DIAGNOSIS — D485 Neoplasm of uncertain behavior of skin: Secondary | ICD-10-CM | POA: Diagnosis not present

## 2015-08-06 DIAGNOSIS — Z85828 Personal history of other malignant neoplasm of skin: Secondary | ICD-10-CM | POA: Diagnosis not present

## 2015-08-08 DIAGNOSIS — M79605 Pain in left leg: Secondary | ICD-10-CM | POA: Diagnosis not present

## 2015-08-08 DIAGNOSIS — L03116 Cellulitis of left lower limb: Secondary | ICD-10-CM | POA: Diagnosis not present

## 2015-08-10 ENCOUNTER — Other Ambulatory Visit (HOSPITAL_BASED_OUTPATIENT_CLINIC_OR_DEPARTMENT_OTHER): Payer: Medicare Other

## 2015-08-10 ENCOUNTER — Ambulatory Visit (INDEPENDENT_AMBULATORY_CARE_PROVIDER_SITE_OTHER): Payer: Medicare Other | Admitting: Pharmacist

## 2015-08-10 ENCOUNTER — Ambulatory Visit (HOSPITAL_BASED_OUTPATIENT_CLINIC_OR_DEPARTMENT_OTHER): Payer: Medicare Other | Admitting: Oncology

## 2015-08-10 VITALS — BP 136/83 | HR 84 | Temp 98.4°F | Resp 17 | Wt 177.9 lb

## 2015-08-10 DIAGNOSIS — C50912 Malignant neoplasm of unspecified site of left female breast: Secondary | ICD-10-CM

## 2015-08-10 DIAGNOSIS — C50412 Malignant neoplasm of upper-outer quadrant of left female breast: Secondary | ICD-10-CM | POA: Diagnosis not present

## 2015-08-10 DIAGNOSIS — I4892 Unspecified atrial flutter: Secondary | ICD-10-CM | POA: Diagnosis not present

## 2015-08-10 DIAGNOSIS — Z5181 Encounter for therapeutic drug level monitoring: Secondary | ICD-10-CM

## 2015-08-10 DIAGNOSIS — M949 Disorder of cartilage, unspecified: Secondary | ICD-10-CM

## 2015-08-10 DIAGNOSIS — Z170 Estrogen receptor positive status [ER+]: Secondary | ICD-10-CM | POA: Insufficient documentation

## 2015-08-10 DIAGNOSIS — M899 Disorder of bone, unspecified: Secondary | ICD-10-CM

## 2015-08-10 LAB — COMPREHENSIVE METABOLIC PANEL
ALT: 21 U/L (ref 0–55)
AST: 23 U/L (ref 5–34)
Albumin: 3.9 g/dL (ref 3.5–5.0)
Alkaline Phosphatase: 79 U/L (ref 40–150)
Anion Gap: 11 mEq/L (ref 3–11)
BUN: 19.9 mg/dL (ref 7.0–26.0)
CO2: 25 mEq/L (ref 22–29)
Calcium: 9.4 mg/dL (ref 8.4–10.4)
Chloride: 102 mEq/L (ref 98–109)
Creatinine: 1.1 mg/dL (ref 0.6–1.1)
EGFR: 47 mL/min/{1.73_m2} — ABNORMAL LOW (ref >90)
Glucose: 121 mg/dl (ref 70–140)
Potassium: 3.7 mEq/L (ref 3.5–5.1)
Sodium: 138 mEq/L (ref 136–145)
Total Bilirubin: 0.86 mg/dL (ref 0.20–1.20)
Total Protein: 7.8 g/dL (ref 6.4–8.3)

## 2015-08-10 LAB — CBC WITH DIFFERENTIAL/PLATELET
BASO%: 1 % (ref 0.0–2.0)
Basophils Absolute: 0.1 10*3/uL (ref 0.0–0.1)
EOS%: 2.1 % (ref 0.0–7.0)
Eosinophils Absolute: 0.1 10*3/uL (ref 0.0–0.5)
HCT: 43.6 % (ref 34.8–46.6)
HGB: 14.3 g/dL (ref 11.6–15.9)
LYMPH%: 27.5 % (ref 14.0–49.7)
MCH: 33.8 pg (ref 25.1–34.0)
MCHC: 32.8 g/dL (ref 31.5–36.0)
MCV: 103.2 fL — ABNORMAL HIGH (ref 79.5–101.0)
MONO#: 0.6 10*3/uL (ref 0.1–0.9)
MONO%: 8.8 % (ref 0.0–14.0)
NEUT#: 3.9 10*3/uL (ref 1.5–6.5)
NEUT%: 60.6 % (ref 38.4–76.8)
Platelets: 256 10*3/uL (ref 145–400)
RBC: 4.22 10*6/uL (ref 3.70–5.45)
RDW: 12.8 % (ref 11.2–14.5)
WBC: 6.4 10*3/uL (ref 3.9–10.3)
lymph#: 1.8 10*3/uL (ref 0.9–3.3)

## 2015-08-10 LAB — POCT INR: INR: 1.9

## 2015-08-10 NOTE — Progress Notes (Signed)
[place]  Telephone:([phone] Fax:(336) [phone]     ID: [name] DOB: 1925-01-23  MR#: [id number]  BJS#:[id number]  Patient Care Team: [name], MD as PCP - General Rolm Bookbinder, MD as Consulting Physician (General Surgery) Chauncey Cruel, MD as Consulting Physician (Oncology) Burnell Blanks, MD as Consulting Physician (Cardiology) Jarome Matin, MD as Consulting Physician (Dermatology) Meredith Pel, MD as Consulting Physician (Orthopedic Surgery) OTHER MD:  CHIEF COMPLAINT: Estrogen receptor positive breast cancer  CURRENT TREATMENT: Anastrozole   BREAST CANCER HISTORY: Mariette noted a change in her left breast and had bilateral screening mammography at [facility] 06/07/2015, with tomosynthesis. Breast density was category A. There was a new oval mass in the left breast central to the nipple. Left breast ultrasound the same day confirmed a 3.4 cm oval mass in the left breast upper outer quadrant, correlating with the mammography. The left axilla was sonographically benign.  Biopsy of the left breast mass in question 06/11/2015 showed ([id number]) a papillary lesion, consistent with an intraductal papilloma, but due to fragmentation the edges of the lesion could not be assessed.  Accordingly the patient was referred to surgery, and after appropriate discussion underwent left lumpectomy 07/25/2015. The final pathology ([id number]) showed 2 foci of invasive ductal carcinoma, grade 2, measuring 3.0 cm, and grade 1, measuring 0.9 cm. There was also ductal carcinoma in situ. The invasive tumor was present at the superior margin, and ductal carcinoma in situ was focally less than 0.1 cm to the same margin. There was evidence of lymphovascular invasion. The prognostic panel showed estrogen receptor 100% positive, progesterone receptor 100% positive, both with strong staining intensity, for both lesions. HER-2 was also negative for both lesions,  the signals ratio being 1.45-1.65, and the number per cell 2.10-2.80.  The patient's subsequent history is as detailed below  INTERVAL HISTORY: [name] was evaluated in the breast clinic 08/10/2015 accompanied by her husband [name], her daughter [name] and her son-in-law [name], and the patient's granddaughter [name]. Her case was also presented in the multidisciplinary breast cancer conference 08/08/2015. At that time a preliminary plan was proposed: Margin reexcision, followed by anti-estrogen therapy. It was felt that sentinel lymph node sampling and adjuvant radiation were not indicated given the patient's age and comorbidities.  REVIEW OF SYSTEMS: [name] has multiple arthritic pains, is status post multiple orthopedic procedures and uses a walker to get around. She participates in yoga at [facility], where she and her husband live The patient denies unusual headaches, visual changes, nausea, vomiting, stiff neck, dizziness, or gait imbalance. There has been no cough, phlegm production, or pleurisy, no chest pain or pressure, and no change in bowel or bladder habits. The patient denies fever, rash, bleeding, unexplained fatigue or unexplained weight loss. A detailed review of systems was otherwise entirely negative.   PAST MEDICAL HISTORY: Past Medical History  Diagnosis Date  . Hypertension   . LBP (low back pain)     severe lumbar spondylosis s/p L3/L4,L4/L5 fusion 12/10  . Osteopenia   . Hypothyroidism   . Chronic constipation   . History of colitis     DX  2003  WITH ISCHEMIC COLITIS  . History of GI bleed     2011--- UPPER SECONARY GASTRIC ULCER FROM NSAID USE  . History of Helicobacter pylori infection     2011  . GERD (gastroesophageal reflux disease)   . H/O hiatal hernia   . Diverticulosis of colon   . History of  ulcer of lower limb     2012   RIGHT LOWER EXTREMITIY--  STATSIS ULCER  . Bilateral edema of lower extremity   . Persistent atrial fibrillation ([place])      CARDIOLOGIST-   DR MCALANY  . Lumbar spondylolysis   . Anticoagulated on Coumadin   . Hyperlipidemia   . Open wound of left lower leg   . Iron deficiency anemia 2011    elevated MCV  . OA (osteoarthritis)     "hands" (08/25/2013)  . History of diverticulitis of colon     perferated diverticulitis w/ abscess 08-25-2013 drain placed --  resolved without surgical intervention  . History of cellulitis     LEFT LOWER LEG --  SEPT 2015  . RBBB     at fib  . Anxiety     denies    PAST SURGICAL HISTORY: Past Surgical History  Procedure Laterality Date  . Shoulder open rotator cuff repair Left 1997  . Total hip arthroplasty Right 06-07-2010  . External fixation leg Left 06/16/2013    Procedure: EXTERNAL FIXATION LEG;  Surgeon: Meredith Pel, MD;  Location: [place];  Service: Orthopedics;  Laterality: Left;  . I&d extremity Left 06/16/2013    Procedure: IRRIGATION AND DEBRIDEMENT EXTREMITY;  Surgeon: Meredith Pel, MD;  Location: [place];  Service: Orthopedics;  Laterality: Left;  . Application of wound vac Left 06/16/2013    Procedure: APPLICATION OF WOUND VAC;  Surgeon: Meredith Pel, MD;  Location: [place];  Service: Orthopedics;  Laterality: Left;  . Tibia im nail insertion Left 06/21/2013    Procedure: INTRAMEDULLARY (IM) NAIL TIBIAL;  Surgeon: Meredith Pel, MD;  Location: [place];  Service: Orthopedics;  Laterality: Left;  . External fixation removal Left 06/21/2013    Procedure: REMOVAL EXTERNAL FIXATION LEG;  Surgeon: Meredith Pel, MD;  Location: [place];  Service: Orthopedics;  Laterality: Left;  . Application of a-cell of extremity Left 06/21/2013    Procedure: APPLICATION OF A-CELL OF EXTREMITY;  Surgeon: Theodoro Kos, DO;  Location: [place];  Service: Plastics;  Laterality: Left;  . Application of wound vac Left 06/21/2013    Procedure: APPLICATION OF WOUND VAC;  Surgeon: Theodoro Kos, DO;  Location: [place];  Service: Plastics;  Laterality: Left;  . Lumbar fusion   01-02-2009    L3-L5  . Transthoracic echocardiogram  09-11-2010   DR MCALHANY    LVSF  55-60%/  MILD MV CALCIFICATION WITH NO STENOSIS/  MILD MR & TR / MODERATE LAE/  MODERATE TO SEVERE RAE  . Tonsillectomy  AS CHILD  . Application of a-cell of extremity Left 08/22/2013    Procedure: APPLICATION OF A-CELL/VAC TO LOWER LEFT LEG WOUND;  Surgeon: Theodoro Kos, DO;  Location: [place];  Service: Plastics;  Laterality: Left;  . Upper gi endoscopy  03-29-2010  . I&d extremity Left 02/06/2014    Procedure: IRRIGATION AND DEBRIDEMENT LEFT LEG WOUND ;  Surgeon: Theodoro Kos, DO;  Location: [place];  Service: Plastics;  Laterality: Left;  . Application of a-cell of extremity Left 02/06/2014    Procedure: WITH PLACEMENT OF A -CELL ;  Surgeon: Theodoro Kos, DO;  Location: [place];  Service: Plastics;  Laterality: Left;  . Hardware removal Left 05/23/2014    Procedure: HARDWARE REMOVAL;  Surgeon: Meredith Pel, MD;  Location: [place];  Service: Orthopedics;  Laterality: Left;  REMOVAL OF HARDWARE LEFT TIBIA  . I&d extremity Left 08/09/2014  Procedure: IRRIGATION AND DEBRIDEMENT  OF LEFT LOWER LEG;  Surgeon: Theodoro Kos, DO;  Location: [place];  Service: Plastics;  Laterality: Left;  . Application of a-cell of extremity Left 08/09/2014    Procedure: PLACEMENT OF A CELL;  Surgeon: Theodoro Kos, DO;  Location: [place];  Service: Plastics;  Laterality: Left;  [name] Eye surgery Bilateral     cataract removal  . Biopsy breast  07/25/2015    LEFT RADIOACTIVE SEED GUIDED EXCISIONAL BREAST BIOPSY (Left) as a surgical intervention  . Radioactive seed guided excisional breast biopsy Left 07/25/2015    Procedure: LEFT RADIOACTIVE SEED GUIDED EXCISIONAL BREAST BIOPSY;  Surgeon: Rolm Bookbinder, MD;  Location: [place] [facility];  Service: General;  Laterality: Left;    FAMILY HISTORY Family History  Problem Relation Age of Onset  . Cancer Father   . Hypertension Other   The  patient's father died at the age of 70 from heart problems. The patient's mother died at the age of 25 with a ruptured aneurysm. The patient had no brothers, 1 sister. There is no history of breast or ovarian cancer in the family.   GYNECOLOGIC HISTORY:  No LMP recorded. Patient is postmenopausal. Menarche age 52, first live birth age 66. The patient is GX P2. She stopped having periods in her early 69s. She did not take hormone replacement. She used oral contraceptives remotely for approximately 2 years, with no complications.  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 Her husband [name] was a Airline pilot. They currently live at [facility]. Daughter [name] lives in [place], near [place]. With her husband [name]. Daughter [name] lives in [place]. The patient has 2 grandchildren and 4 great-grandchildren. She is a Psychologist, forensic.    ADVANCED DIRECTIVES: The patient has a living will in place and she has named her granddaughter [name] as her healthcare part of attorney. Lattie Haw can be reached at [place]: Social History  Substance Use Topics  . Smoking status: Never Smoker   . Smokeless tobacco: Never Used  . Alcohol Use: No     Comment: 1 glass occ wine 08/03/14- none in a year     Colonoscopy: Laurence Spates, remote  PAP:  Bone density:   Allergies  Allergen Reactions  . Zosyn [Piperacillin Sod-Tazobactam So] Rash and Other (See Comments)    Morbilliform eruption with itching  . Atenolol Nausea And Vomiting  . Clarithromycin Nausea And Vomiting  . Codeine Sulfate Nausea Only  . Levaquin [Levofloxacin] Nausea Only  . Macrodantin Other (See Comments)    Reaction unknown  . Percocet [Oxycodone-Acetaminophen] Nausea And Vomiting    Current Outpatient Prescriptions  Medication Sig Dispense Refill  . acetaminophen (TYLENOL) 500 MG tablet Take 500 mg by mouth daily as needed for mild pain.     [name] atorvastatin  (LIPITOR) 10 MG tablet Take 1 tablet (10 mg total) by mouth every evening. 90 tablet 3  . B Complex-C (B-COMPLEX WITH VITAMIN C) tablet Take 1 tablet by mouth daily.    . benzonatate (TESSALON) 200 MG capsule Take 1 capsule (200 mg total) by mouth 2 (two) times daily as needed for cough. 60 capsule 0  . busPIRone (BUSPAR) 7.5 MG tablet Take 1 tablet (7.5 mg total) by mouth 2 (two) times daily. 180 tablet 3  . Cholecalciferol (VITAMIN D3) 1000 UNITS tablet Take 1,000 Units by mouth daily.      [name] diltiazem (CARDIZEM CD) 120 MG 24 hr  capsule TAKE 1 CAPSULE (120 MG TOTAL) BY MOUTH DAILY. 90 capsule 3  . doxycycline (VIBRAMYCIN) 100 MG capsule Take 100 mg by mouth 2 (two) times daily.    . Ferrous Sulfate (IRON) 325 (65 FE) MG TABS Take 1 tablet by mouth daily.    . furosemide (LASIX) 40 MG tablet Take 1 tablet (40 mg total) by mouth as directed. 1 po on Mon-Wed-Fri in am 90 tablet 3  . KLOR-CON M10 10 MEQ tablet Take 1 tablet (10 mEq total) by mouth daily. 90 tablet 3  . levothyroxine (SYNTHROID, LEVOTHROID) 25 MCG tablet TAKE 1 TABLET BY MOUTH ONCE DAILY FOR THYROID 90 tablet 3  . metoprolol succinate (TOPROL-XL) 50 MG 24 hr tablet Take 1 tablet (50 mg total) by mouth 2 (two) times daily. Take with or immediately following a meal. 180 tablet 2  . Multiple Vitamins-Minerals (ZINC PO) Take 1 tablet by mouth daily.    [name] oxybutynin (DITROPAN) 5 MG tablet Take 1 tablet (5 mg total) by mouth 2 (two) times daily. 180 tablet 3  . polyethylene glycol (MIRALAX / GLYCOLAX) packet Take 17 g by mouth daily as needed for moderate constipation.     . temazepam (RESTORIL) 30 MG capsule Take 1 capsule (30 mg total) by mouth at bedtime. 30 capsule 3  . triamcinolone ointment (KENALOG) 0.1 % Apply 1 application topically 2 (two) times daily. On L dist shin dry skin area 30 g 1  . vitamin C (ASCORBIC ACID) 500 MG tablet Take 500 mg by mouth daily.    [name] warfarin (COUMADIN) 5 MG tablet TAKE AS DIRECTED BY THERE COUMADIN  CLINIC 30 tablet 3   No current facility-administered medications for this visit.    OBJECTIVE: Elderly white woman examined in a chair Filed Vitals:   08/10/15 1537  BP: 136/83  Pulse: 84  Temp: 98.4 F (36.9 C)  Resp: 17     Body mass index is 31.52 kg/(m^2).    ECOG FS:2 - Symptomatic, <50% confined to bed  Ocular: Sclerae unicteric, EOMs intact Ear-nose-throat: Oropharynx clear and moist Lymphatic: No cervical or supraclavicular adenopathy Lungs no rales or rhonchi Heart regular rate and rhythm Abd soft, obese, nontender, positive bowel sounds MSK kyphosis but no focal spinal tenderness Neuro: non-focal, well-oriented, appropriate affect Breasts: The right breast is unremarkable. The left breast is status post recent lumpectomy. The cosmetic result is good. There is no dehiscence, erythema, or swelling. There is no suspicious mass. The left axilla is benign.   LAB RESULTS:  CMP     Component Value Date/Time   NA 138 08/10/2015 1507   NA 138 07/18/2015 1146   K 3.7 08/10/2015 1507   K 3.5 07/18/2015 1146   CL 104 07/18/2015 1146   CO2 25 08/10/2015 1507   CO2 24 07/18/2015 1146   GLUCOSE 121 08/10/2015 1507   GLUCOSE 109* 07/18/2015 1146   BUN 19.9 08/10/2015 1507   BUN 15 07/18/2015 1146   CREATININE 1.1 08/10/2015 1507   CREATININE 0.79 07/18/2015 1146   CALCIUM 9.4 08/10/2015 1507   CALCIUM 9.4 07/18/2015 1146   PROT 7.8 08/10/2015 1507   PROT 7.6 02/25/2015 0252   ALBUMIN 3.9 08/10/2015 1507   ALBUMIN 4.0 02/25/2015 0252   AST 23 08/10/2015 1507   AST 36 02/25/2015 0252   ALT 21 08/10/2015 1507   ALT 31 02/25/2015 0252   ALKPHOS 79 08/10/2015 1507   ALKPHOS 75 02/25/2015 0252   BILITOT 0.86 08/10/2015 1507   BILITOT  1.2 02/25/2015 0252   GFRNONAA >60 07/18/2015 1146   GFRAA >60 07/18/2015 1146    INo results found for: SPEP, UPEP  Lab Results  Component Value Date   WBC 6.4 08/10/2015   NEUTROABS 3.9 08/10/2015   HGB 14.3 08/10/2015   HCT  43.6 08/10/2015   MCV 103.2* 08/10/2015   PLT 256 08/10/2015      Chemistry      Component Value Date/Time   NA 138 08/10/2015 1507   NA 138 07/18/2015 1146   K 3.7 08/10/2015 1507   K 3.5 07/18/2015 1146   CL 104 07/18/2015 1146   CO2 25 08/10/2015 1507   CO2 24 07/18/2015 1146   BUN 19.9 08/10/2015 1507   BUN 15 07/18/2015 1146   CREATININE 1.1 08/10/2015 1507   CREATININE 0.79 07/18/2015 1146      Component Value Date/Time   CALCIUM 9.4 08/10/2015 1507   CALCIUM 9.4 07/18/2015 1146   ALKPHOS 79 08/10/2015 1507   ALKPHOS 75 02/25/2015 0252   AST 23 08/10/2015 1507   AST 36 02/25/2015 0252   ALT 21 08/10/2015 1507   ALT 31 02/25/2015 0252   BILITOT 0.86 08/10/2015 1507   BILITOT 1.2 02/25/2015 0252       No results found for: LABCA2  No components found for: LABCA125   Recent Labs Lab 08/10/15 1412  INR 1.9    Urinalysis    Component Value Date/Time   COLORURINE YELLOW 02/25/2015 0305   APPEARANCEUR CLEAR 02/25/2015 0305   LABSPEC 1.008 02/25/2015 0305   PHURINE 7.5 02/25/2015 0305   GLUCOSEU NEGATIVE 02/25/2015 0305   GLUCOSEU NEGATIVE 01/17/2015 1415   HGBUR NEGATIVE 02/25/2015 0305   BILIRUBINUR NEGATIVE 02/25/2015 0305   KETONESUR NEGATIVE 02/25/2015 0305   PROTEINUR NEGATIVE 02/25/2015 0305   UROBILINOGEN 0.2 01/17/2015 1415   NITRITE NEGATIVE 02/25/2015 0305   LEUKOCYTESUR TRACE* 02/25/2015 0305     STUDIES: No results found.  ELIGIBLE FOR AVAILABLE RESEARCH PROTOCOL: PALLAS  ASSESSMENT: 82 y.o. [place] woman status post left breast upper outer quadrant biopsy 06/11/2015 for a papillary lesion with unclear margins.  (1) status post left lumpectomy 07/25/2015 for an mpT2 cN0, stage IIA  invasive ductal carcinoma,  grade I-II  estrogen and progesterone receptor strongly positive, HER-2 not amplified   (a) additional surgery for margin clearance pending  (2) No sentinel lymph node sampling or adjuvant radiation plan  (3) adjuvant  anastrozole started 08/10/2015  PLAN: We spent the better part of today's hour-long appointment discussing the biology of breast cancer in general, and the specifics of the patient's tumor in particular. We first reviewed the fact that cancer is not one disease but more than 100 different diseases and that it is important to keep them separate-- otherwise when friends and relatives discuss their own cancer experiences with [place] confusion can result. Similarly we explained that if breast cancer spreads to the bone or liver, the patient would not have bone cancer or liver cancer, but breast cancer in the bone and breast cancer in the liver: one cancer in three places-- not 3 different cancers which otherwise would have to be treated in 3 different ways.  We discussed the difference between local and systemic therapy. In terms of loco-regional treatment, the options are surgery and radiation the patient has already had a lumpectomy. Because there were positive and close margins she is scheduled for further resection on 09/05/2015.   In many cases we proceed to radiation in addition. However in women  over 70, we have studies that show if they take anti-estrogens for 5 years there is no survival compromise in omitting radiation. Our feeling at conference was that it is safe for Katha to avoid radiation so long as she does take anti-estrogens for 5 years.  We then discussed the rationale for systemic therapy. There is some risk that this cancer may have already spread to other parts of her body. Kori had already been scheduled for bone scan and CT scan of the chest. The problem is that in early stage disease we are much more likely to find false positives then true cancers and this would expose the patient to unnecessary procedures as well as unnecessary radiation. Scans cannot answer the question the patient really would like to know, which is whether she has microscopic disease elsewhere in her body. Finally, if  we found metastatic disease in this case the treatment at least initially would be the same as we are planning to do. For these reasons after much discussion we decided to cancel the staging studies.   Of course we would proceed to aggressive evaluation of any symptoms that might suggest metastatic disease in the future.  Next we went over the options for systemic therapy which are anti-estrogens, anti-HER-2 immunotherapy, and chemotherapy. She is not a candidate for anti-HER-2 treatment since the tumor is HER-2 nonamplified. Chemotherapy is generally best used in aggressive tumors and we have a cancer that is in the less aggressive side. Accordingly we do not recommend consideration of chemotherapy in this case  Karigan is a good candidate for anti-estrogen therapy. This will essentially cut the risk of breast cancer in half. Because of her clotting concerns, tamoxifen would not be a good choice. Accordingly we are going to start anastrozole.  Today we discussed the possible toxicities, side effects and complications of this agent. We also discussed cost issues. If she developed the arthralgias and myalgias that some patients can experience, she will let us know and we will switch her to a different agent. Otherwise she will start anastrozole today and I went ahead and wrote her the prescription.  Anastrozole can increase the rate of bone loss and she already has significant osteopenia or osteoporosis per her report. She tells me she tried Fosamax at one point but it made her sick. Accordingly at the next visit here we will start her on denosumab/Prolia. That should take care of the bone density problem  In short the plan is to start anastrozole now and continue it for 5 years. She is also scheduled for surgery for margin clearance 09/05/2015. She will return to see me the first week in October. If she tolerates anastrozole well I will start seeing her on an every six-month basis.  Velena has a good  understanding of the overall plan. She agrees with it. She knows the goal of treatment in her case is cure. She will call with any problems that may develop before her next visit here.  Incidentally the patient requests that for any information, scheduling concerns, or other issues you first contact her granddaughter. I have entered that information on her face sheet.  Chauncey Cruel, MD   08/10/2015 5:51 PM Medical Oncology and Hematology [facility] [address] [place], [place] [address] Tel. [phone]    Fax. [phone]

## 2015-08-13 ENCOUNTER — Other Ambulatory Visit: Payer: Self-pay | Admitting: Oncology

## 2015-08-13 ENCOUNTER — Other Ambulatory Visit: Payer: Self-pay | Admitting: *Deleted

## 2015-08-13 DIAGNOSIS — C50412 Malignant neoplasm of upper-outer quadrant of left female breast: Secondary | ICD-10-CM

## 2015-08-15 ENCOUNTER — Encounter (HOSPITAL_COMMUNITY): Payer: Medicare Other

## 2015-08-15 ENCOUNTER — Ambulatory Visit (HOSPITAL_COMMUNITY): Payer: Medicare Other

## 2015-08-28 ENCOUNTER — Ambulatory Visit (INDEPENDENT_AMBULATORY_CARE_PROVIDER_SITE_OTHER): Payer: Medicare Other | Admitting: *Deleted

## 2015-08-28 DIAGNOSIS — Z5181 Encounter for therapeutic drug level monitoring: Secondary | ICD-10-CM | POA: Diagnosis not present

## 2015-08-28 DIAGNOSIS — I4892 Unspecified atrial flutter: Secondary | ICD-10-CM

## 2015-08-28 LAB — POCT INR: INR: 2.2

## 2015-08-29 ENCOUNTER — Encounter (HOSPITAL_COMMUNITY): Payer: Self-pay

## 2015-08-29 NOTE — Pre-Procedure Instructions (Addendum)
[name]  08/29/2015      [place], [place] - [address] [place] [address] Phone: [phone] Fax: [phone]    Your procedure is scheduled on Wednesday August 16.  Report to [facility] Admitting at 8:45 A.M.   Call this number if you have problems the morning of surgery:  [phone]   Remember:  Do not eat food or drink liquids after midnight.   Take these medicines the morning of surgery with A SIP OF WATER: acetaminophen (tylenol) if needed, Buspar (buspirone), diltiazem (Cardizem), doxycycline (Vibramycin), levothyroxine (Synthroid), metoprolol (Toprol-XL), oxybutynin (Ditropan), Omeprazole (Prilosec)   Follow your MD's instructions for your Warfarin (Coumadin). Per MD, last dose is Thursday, August 10th.    7 days prior to surgery STOP taking any Aspirin, Aleve, Naproxen, Ibuprofen, Motrin, Advil, Goody's, BC's, all herbal medications, fish oil, and all vitamins    Do not wear jewelry, make-up or nail polish.  Do not wear lotions, powders, or perfumes.  You may NOT wear deoderant.  Do not shave 48 hours prior to surgery.    Do not bring valuables to the hospital.  [facility] is not responsible for any belongings or valuables.  Contacts, dentures or bridgework may not be worn into surgery.  Leave your suitcase in the car.  After surgery it may be brought to your room.  For patients admitted to the hospital, discharge time will be determined by your treatment team.  Patients discharged the day of surgery will not be allowed to drive home.    Special instructions:     [place]- Preparing For Surgery  Before surgery, you can play an important role. Because skin is not sterile, your skin needs to be as free of germs as possible. You can reduce the number of germs on your skin by washing with CHG (chlorahexidine gluconate) Soap before surgery.  CHG is an antiseptic cleaner which kills germs and bonds with  the skin to continue killing germs even after washing.  Please do not use if you have an allergy to CHG or antibacterial soaps. If your skin becomes reddened/irritated stop using the CHG.  Do not shave (including legs and underarms) for at least 48 hours prior to first CHG shower. It is OK to shave your face.  Please follow these instructions carefully.   1. Shower the NIGHT BEFORE SURGERY and the MORNING OF SURGERY with CHG.   2. If you chose to wash your hair, wash your hair first as usual with your normal shampoo.  3. After you shampoo, rinse your hair and body thoroughly to remove the shampoo.  4. Use CHG as you would any other liquid soap. You can apply CHG directly to the skin and wash gently with a scrungie or a clean washcloth.   5. Apply the CHG Soap to your body ONLY FROM THE NECK DOWN.  Do not use on open wounds or open sores. Avoid contact with your eyes, ears, mouth and genitals (private parts). Wash genitals (private parts) with your normal soap.  6. Wash thoroughly, paying special attention to the area where your surgery will be performed.  7. Thoroughly rinse your body with warm water from the neck down.  8. DO NOT shower/wash with your normal soap after using and rinsing off the CHG Soap.  9. Pat yourself dry with a CLEAN TOWEL.   10. Wear CLEAN PAJAMAS   11. Place CLEAN SHEETS on your  bed the night of your first shower and DO NOT SLEEP WITH PETS.   Day of Surgery: Do not apply any deodorants/lotions. Please wear clean clothes to the hospital/surgery center.    Please read over the following fact sheets that you were given.

## 2015-08-30 ENCOUNTER — Encounter (HOSPITAL_COMMUNITY)
Admission: RE | Admit: 2015-08-30 | Discharge: 2015-08-30 | Disposition: A | Discharge status: Home / Self Care | Payer: Medicare Other | Source: Ambulatory Visit | Attending: General Surgery | Admitting: General Surgery

## 2015-08-30 ENCOUNTER — Encounter (HOSPITAL_COMMUNITY): Payer: Self-pay

## 2015-08-30 DIAGNOSIS — Z79899 Other long term (current) drug therapy: Secondary | ICD-10-CM | POA: Diagnosis not present

## 2015-08-30 DIAGNOSIS — I10 Essential (primary) hypertension: Secondary | ICD-10-CM | POA: Insufficient documentation

## 2015-08-30 DIAGNOSIS — Z981 Arthrodesis status: Secondary | ICD-10-CM | POA: Diagnosis not present

## 2015-08-30 DIAGNOSIS — C50912 Malignant neoplasm of unspecified site of left female breast: Secondary | ICD-10-CM | POA: Diagnosis not present

## 2015-08-30 DIAGNOSIS — E039 Hypothyroidism, unspecified: Secondary | ICD-10-CM | POA: Insufficient documentation

## 2015-08-30 DIAGNOSIS — Z7901 Long term (current) use of anticoagulants: Secondary | ICD-10-CM | POA: Diagnosis not present

## 2015-08-30 DIAGNOSIS — Z01812 Encounter for preprocedural laboratory examination: Secondary | ICD-10-CM | POA: Diagnosis not present

## 2015-08-30 DIAGNOSIS — Z96641 Presence of right artificial hip joint: Secondary | ICD-10-CM | POA: Insufficient documentation

## 2015-08-30 DIAGNOSIS — Z01818 Encounter for other preprocedural examination: Secondary | ICD-10-CM | POA: Diagnosis not present

## 2015-08-30 DIAGNOSIS — D509 Iron deficiency anemia, unspecified: Secondary | ICD-10-CM | POA: Insufficient documentation

## 2015-08-30 HISTORY — DX: Malignant (primary) neoplasm, unspecified: C80.1

## 2015-08-30 HISTORY — DX: Frequency of micturition: R35.0

## 2015-08-30 LAB — BASIC METABOLIC PANEL
Anion gap: 9 (ref 5–15)
BUN: 13 mg/dL (ref 6–20)
CO2: 26 mmol/L (ref 22–32)
Calcium: 9.2 mg/dL (ref 8.9–10.3)
Chloride: 102 mmol/L (ref 101–111)
Creatinine, Ser: 0.78 mg/dL (ref 0.44–1.00)
GFR calc Af Amer: >60 mL/min (ref >60)
GFR calc non Af Amer: >60 mL/min (ref >60)
Glucose, Bld: 98 mg/dL (ref 65–99)
Potassium: 3.9 mmol/L (ref 3.5–5.1)
Sodium: 137 mmol/L (ref 135–145)

## 2015-08-30 LAB — CBC
HCT: 44 % (ref 36.0–46.0)
Hemoglobin: 14.4 g/dL (ref 12.0–15.0)
MCH: 34.7 pg — ABNORMAL HIGH (ref 26.0–34.0)
MCHC: 32.7 g/dL (ref 30.0–36.0)
MCV: 106 fL — ABNORMAL HIGH (ref 78.0–100.0)
Platelets: 198 10*3/uL (ref 150–400)
RBC: 4.15 MIL/uL (ref 3.87–5.11)
RDW: 13 % (ref 11.5–15.5)
WBC: 5.8 10*3/uL (ref 4.0–10.5)

## 2015-08-30 NOTE — Progress Notes (Signed)
PCP - Dr. Lew Dawes Cardiologist - Dr. Angelena Form  EKG- 02/25/15 CXR - 03/06/15  Echo- 09/11/10 Stress test/Cardiac Cath - denies  Patient denies chest pain and shortness of breath at PAT appointment.  Per MD's instructions, patient's last dose of Coumadin is 08/30/15.  PT-INR will be collected day of surgery.

## 2015-08-31 ENCOUNTER — Other Ambulatory Visit: Payer: Self-pay | Admitting: General Surgery

## 2015-08-31 NOTE — Progress Notes (Addendum)
Anesthesia Chart Review:  Patient is a 82 year old female scheduled for re-excision of left breast lumpectomy on 09/05/15 by Dr. Donne Hazel.   History includes left breast cancer s/p left lumpectomy 07/25/15, non-smoker, paroxysmal afib/flutter (chronic for the past three years), iron deficiency anemia, GI bleed, HTN, hypothyroidism, anxiety, acute renal failure '11 in the setting of dehydration, right THR '12, pelvic abscess felt likely rupture diverticulitis s/p abx and drain by IR s/p removal 08/2013, lumbar fusion '10, diverticular abscess s/p drain by IR 09/06/13, left tib-fib fracture s/p external fixator 06/16/13 s/p removal and debridement with IM nailing 06/22/14 s/p A-cell/VAC application 0000000 and [id number] s/p removal of hardware 05/23/14 s/p I&D 08/09/14.   PCP is Dr. Alain Marion, last visit 07/16/15. Cardiologist is Dr. Angelena Form, last visit 02/26/15. HEM-ONC is Dr. Jana Hakim, last visit 08/10/15.  Meds include Lipitor, Buspar, diltiazem, doxycycline, Iron, Lasix, KCl, levothyroxine, Toprol XL, Restoril, Ditropan, Prilosec, tramadol, warfarin (on hold for surgery since 8//10/17), anastrozole.   02/25/15 EKG: afib at 98 bpm, RBBB, inferior ST abnormality, consider ischemia. Baseline wanderer in limb leads.She has a known RBBB and chronic afib.  Also with intermittent inferior ST abnormality since at least 11/2013. HR at PAT was 67 bpm.  09/11/10 Echo: Study Conclusions - Left ventricle: The cavity size was normal. Wall thickness was normal. Systolic function was normal. The estimated ejection fraction was in the range of 55% to 60%. Wall motion was normal; there were no regional wall motion abnormalities. Doppler parameters are consistent with elevated ventricular end-diastolic filling pressure. - Aortic valve: Calcification of non coronary cusp - Mitral valve: Mildly calcified annulus. Mild regurgitation. - Left atrium: The atrium was moderately dilated. - Right atrium: The atrium was  moderately to severely dilated. - Pericardium, extracardiac: A trivial pericardial effusion was identified posterior to the heart.  03/06/15 CXR: IMPRESSION: No acute abnormality  Preoperative labs noted. She is scheduled for PT/INR on arrival.   She has undergone multiple procedure in the past couple of years, last just over a month ago. Her afib was rate controlled at PAT.  Further evaluation on the day of surgery by her anesthesiologist to ensure no acute changes prior to her surgery and that INR in an acceptable range.  George [facility] Short Stay Center/Anesthesiology Phone [phone] 08/31/2015 3:18 PM

## 2015-08-31 NOTE — Progress Notes (Deleted)
error 

## 2015-09-05 ENCOUNTER — Encounter (HOSPITAL_COMMUNITY)
Admission: RE | Disposition: A | Discharge status: Home / Self Care | Payer: Self-pay | Source: Ambulatory Visit | Attending: General Surgery

## 2015-09-05 ENCOUNTER — Ambulatory Visit (HOSPITAL_COMMUNITY): Payer: Medicare Other | Admitting: Emergency Medicine

## 2015-09-05 ENCOUNTER — Encounter (HOSPITAL_COMMUNITY): Payer: Self-pay | Admitting: *Deleted

## 2015-09-05 ENCOUNTER — Ambulatory Visit (HOSPITAL_COMMUNITY)
Admission: RE | Admit: 2015-09-05 | Discharge: 2015-09-05 | Disposition: A | Discharge status: Home / Self Care | Payer: Medicare Other | Source: Ambulatory Visit | Attending: General Surgery | Admitting: General Surgery

## 2015-09-05 ENCOUNTER — Ambulatory Visit (HOSPITAL_COMMUNITY): Payer: Medicare Other | Admitting: Certified Registered Nurse Anesthetist

## 2015-09-05 DIAGNOSIS — E039 Hypothyroidism, unspecified: Secondary | ICD-10-CM | POA: Insufficient documentation

## 2015-09-05 DIAGNOSIS — Z7901 Long term (current) use of anticoagulants: Secondary | ICD-10-CM | POA: Diagnosis not present

## 2015-09-05 DIAGNOSIS — Z170 Estrogen receptor positive status [ER+]: Secondary | ICD-10-CM | POA: Insufficient documentation

## 2015-09-05 DIAGNOSIS — D509 Iron deficiency anemia, unspecified: Secondary | ICD-10-CM | POA: Diagnosis not present

## 2015-09-05 DIAGNOSIS — M19041 Primary osteoarthritis, right hand: Secondary | ICD-10-CM | POA: Diagnosis not present

## 2015-09-05 DIAGNOSIS — I4510 Unspecified right bundle-branch block: Secondary | ICD-10-CM | POA: Diagnosis not present

## 2015-09-05 DIAGNOSIS — Z6831 Body mass index (BMI) 31.0-31.9, adult: Secondary | ICD-10-CM | POA: Diagnosis not present

## 2015-09-05 DIAGNOSIS — E785 Hyperlipidemia, unspecified: Secondary | ICD-10-CM | POA: Diagnosis not present

## 2015-09-05 DIAGNOSIS — Z96641 Presence of right artificial hip joint: Secondary | ICD-10-CM | POA: Diagnosis not present

## 2015-09-05 DIAGNOSIS — C50912 Malignant neoplasm of unspecified site of left female breast: Secondary | ICD-10-CM | POA: Diagnosis not present

## 2015-09-05 DIAGNOSIS — I481 Persistent atrial fibrillation: Secondary | ICD-10-CM | POA: Insufficient documentation

## 2015-09-05 DIAGNOSIS — D0512 Intraductal carcinoma in situ of left breast: Secondary | ICD-10-CM | POA: Diagnosis not present

## 2015-09-05 DIAGNOSIS — K219 Gastro-esophageal reflux disease without esophagitis: Secondary | ICD-10-CM | POA: Diagnosis not present

## 2015-09-05 DIAGNOSIS — M19042 Primary osteoarthritis, left hand: Secondary | ICD-10-CM | POA: Insufficient documentation

## 2015-09-05 DIAGNOSIS — I10 Essential (primary) hypertension: Secondary | ICD-10-CM | POA: Diagnosis not present

## 2015-09-05 DIAGNOSIS — Z951 Presence of aortocoronary bypass graft: Secondary | ICD-10-CM | POA: Diagnosis not present

## 2015-09-05 DIAGNOSIS — Z79899 Other long term (current) drug therapy: Secondary | ICD-10-CM | POA: Insufficient documentation

## 2015-09-05 DIAGNOSIS — C50412 Malignant neoplasm of upper-outer quadrant of left female breast: Secondary | ICD-10-CM | POA: Insufficient documentation

## 2015-09-05 HISTORY — PX: RE-EXCISION OF BREAST LUMPECTOMY: SHX6048

## 2015-09-05 LAB — PROTIME-INR
INR: 1.21
Prothrombin Time: 15.4 seconds — ABNORMAL HIGH (ref 11.4–15.2)

## 2015-09-05 SURGERY — EXCISION, LESION, BREAST
Anesthesia: General | Site: Breast | Laterality: Left

## 2015-09-05 MED ORDER — TRAMADOL HCL 50 MG PO TABS: 50.0000 mg | ORAL_TABLET | Freq: Four times a day (QID) | ORAL | 0 refills | Status: DC | PRN

## 2015-09-05 MED ORDER — FENTANYL CITRATE (PF) 100 MCG/2ML IJ SOLN
25.0000 ug | INTRAMUSCULAR | Status: DC | PRN
  Administered 2015-09-05 (×2): 50 ug via INTRAVENOUS

## 2015-09-05 MED ORDER — MEPERIDINE HCL 25 MG/ML IJ SOLN: 6.2500 mg | INTRAMUSCULAR | Status: DC | PRN

## 2015-09-05 MED ORDER — ONDANSETRON HCL 4 MG/2ML IJ SOLN
INTRAMUSCULAR | Status: DC | PRN
  Administered 2015-09-05: 4 mg via INTRAVENOUS

## 2015-09-05 MED ORDER — BUPIVACAINE-EPINEPHRINE (PF) 0.25% -1:200000 IJ SOLN
INTRAMUSCULAR | Status: AC
  Filled 2015-09-05: qty 30

## 2015-09-05 MED ORDER — LIDOCAINE 2% (20 MG/ML) 5 ML SYRINGE
INTRAMUSCULAR | Status: DC | PRN
  Administered 2015-09-05: 90 mg via INTRAVENOUS

## 2015-09-05 MED ORDER — HEMOSTATIC AGENTS (NO CHARGE) OPTIME
TOPICAL | Status: DC | PRN
  Administered 2015-09-05: 1 via TOPICAL

## 2015-09-05 MED ORDER — 0.9 % SODIUM CHLORIDE (POUR BTL) OPTIME
TOPICAL | Status: DC | PRN
  Administered 2015-09-05: 1000 mL

## 2015-09-05 MED ORDER — PROPOFOL 10 MG/ML IV BOLUS
INTRAVENOUS | Status: DC | PRN
  Administered 2015-09-05: 100 mg via INTRAVENOUS

## 2015-09-05 MED ORDER — LACTATED RINGERS IV SOLN
INTRAVENOUS | Status: DC
  Administered 2015-09-05: 10:00:00 via INTRAVENOUS

## 2015-09-05 MED ORDER — PHENYLEPHRINE 40 MCG/ML (10ML) SYRINGE FOR IV PUSH (FOR BLOOD PRESSURE SUPPORT)
PREFILLED_SYRINGE | INTRAVENOUS | Status: DC | PRN
  Administered 2015-09-05 (×3): 80 ug via INTRAVENOUS

## 2015-09-05 MED ORDER — FENTANYL CITRATE (PF) 100 MCG/2ML IJ SOLN
INTRAMUSCULAR | Status: DC | PRN
  Administered 2015-09-05 (×2): 100 ug via INTRAVENOUS

## 2015-09-05 MED ORDER — FENTANYL CITRATE (PF) 100 MCG/2ML IJ SOLN
INTRAMUSCULAR | Status: AC
  Filled 2015-09-05: qty 2

## 2015-09-05 MED ORDER — BUPIVACAINE-EPINEPHRINE 0.25% -1:200000 IJ SOLN
INTRAMUSCULAR | Status: DC | PRN
  Administered 2015-09-05: 10 mL

## 2015-09-05 MED ORDER — FENTANYL CITRATE (PF) 100 MCG/2ML IJ SOLN
INTRAMUSCULAR | Status: AC
  Administered 2015-09-05: 50 ug via INTRAVENOUS
  Filled 2015-09-05: qty 2

## 2015-09-05 MED ORDER — PROPOFOL 10 MG/ML IV BOLUS
INTRAVENOUS | Status: AC
  Filled 2015-09-05: qty 20

## 2015-09-05 SURGICAL SUPPLY — 52 items
APPLIER CLIP 9.375 MED OPEN (MISCELLANEOUS)
APR CLP MED 9.3 20 MLT OPN (MISCELLANEOUS)
BINDER BREAST XLRG (GAUZE/BANDAGES/DRESSINGS) ×2 IMPLANT
BLADE SURG 15 STRL LF DISP TIS (BLADE) IMPLANT
BLADE SURG 15 STRL SS (BLADE) ×3
CANISTER SUCTION 2500CC (MISCELLANEOUS) ×3 IMPLANT
CHLORAPREP W/TINT 26ML (MISCELLANEOUS) ×3 IMPLANT
CLIP APPLIE 9.375 MED OPEN (MISCELLANEOUS) IMPLANT
CLOSURE WOUND 1/2 X4 (GAUZE/BANDAGES/DRESSINGS) ×1
COVER SURGICAL LIGHT HANDLE (MISCELLANEOUS) ×3 IMPLANT
DRAPE CHEST BREAST 15X10 FENES (DRAPES) ×3 IMPLANT
DRAPE UTILITY XL STRL (DRAPES) ×2 IMPLANT
ELECT CAUTERY BLADE 6.4 (BLADE) ×3 IMPLANT
ELECT REM PT RETURN 9FT ADLT (ELECTROSURGICAL) ×3
ELECTRODE REM PT RTRN 9FT ADLT (ELECTROSURGICAL) ×1 IMPLANT
GLOVE BIO SURGEON STRL SZ7 (GLOVE) ×3 IMPLANT
GLOVE BIOGEL PI IND STRL 7.5 (GLOVE) ×1 IMPLANT
GLOVE BIOGEL PI IND STRL 8 (GLOVE) IMPLANT
GLOVE BIOGEL PI INDICATOR 7.5 (GLOVE) ×2
GLOVE BIOGEL PI INDICATOR 8 (GLOVE) ×2
GLOVE ECLIPSE 7.0 STRL STRAW (GLOVE) ×2 IMPLANT
GLOVE SURG SS PI 8.0 STRL IVOR (GLOVE) ×2 IMPLANT
GOWN STRL REUS W/ TWL LRG LVL3 (GOWN DISPOSABLE) ×2 IMPLANT
GOWN STRL REUS W/ TWL XL LVL3 (GOWN DISPOSABLE) IMPLANT
GOWN STRL REUS W/TWL LRG LVL3 (GOWN DISPOSABLE) ×6
GOWN STRL REUS W/TWL XL LVL3 (GOWN DISPOSABLE) ×3
KIT BASIN OR (CUSTOM PROCEDURE TRAY) ×3 IMPLANT
KIT MARKER MARGIN INK (KITS) ×2 IMPLANT
KIT ROOM TURNOVER OR (KITS) ×3 IMPLANT
LIQUID BAND (GAUZE/BANDAGES/DRESSINGS) ×3 IMPLANT
NDL HYPO 25GX1X1/2 BEV (NEEDLE) ×1 IMPLANT
NEEDLE HYPO 25GX1X1/2 BEV (NEEDLE) ×3 IMPLANT
NS IRRIG 1000ML POUR BTL (IV SOLUTION) ×3 IMPLANT
PACK SURGICAL SETUP 50X90 (CUSTOM PROCEDURE TRAY) ×3 IMPLANT
PAD ARMBOARD 7.5X6 YLW CONV (MISCELLANEOUS) ×3 IMPLANT
PENCIL BUTTON HOLSTER BLD 10FT (ELECTRODE) ×3 IMPLANT
SPONGE LAP 18X18 X RAY DECT (DISPOSABLE) ×3 IMPLANT
STAPLER VISISTAT 35W (STAPLE) ×3 IMPLANT
STRIP CLOSURE SKIN 1/2X4 (GAUZE/BANDAGES/DRESSINGS) ×1 IMPLANT
SUT MNCRL AB 4-0 PS2 18 (SUTURE) ×3 IMPLANT
SUT SILK 2 0 SH (SUTURE) IMPLANT
SUT VIC AB 2-0 SH 27 (SUTURE) ×3
SUT VIC AB 2-0 SH 27XBRD (SUTURE) ×1 IMPLANT
SUT VIC AB 3-0 SH 27 (SUTURE) ×3
SUT VIC AB 3-0 SH 27X BRD (SUTURE) ×1 IMPLANT
SYR BULB 3OZ (MISCELLANEOUS) ×3 IMPLANT
SYR CONTROL 10ML LL (SYRINGE) ×3 IMPLANT
TOWEL OR 17X24 6PK STRL BLUE (TOWEL DISPOSABLE) ×3 IMPLANT
TOWEL OR 17X26 10 PK STRL BLUE (TOWEL DISPOSABLE) ×1 IMPLANT
TUBE CONNECTING 12'X1/4 (SUCTIONS) ×1
TUBE CONNECTING 12X1/4 (SUCTIONS) ×2 IMPLANT
YANKAUER SUCT BULB TIP NO VENT (SUCTIONS) ×3 IMPLANT

## 2015-09-05 NOTE — Anesthesia Postprocedure Evaluation (Signed)
Anesthesia Post Note  Patient: [name]  Procedure(s) Performed: Procedure(s) (LRB): RE-EXCISION OF LEFT BREAST LUMPECTOMY (Left)  Patient location during evaluation: PACU Anesthesia Type: General Level of consciousness: awake and alert Pain management: pain level controlled Vital Signs Assessment: post-procedure vital signs reviewed and stable Respiratory status: spontaneous breathing, nonlabored ventilation, respiratory function stable and patient connected to nasal cannula oxygen Cardiovascular status: blood pressure returned to baseline and stable Postop Assessment: no signs of nausea or vomiting Anesthetic complications: no    Last Vitals:  Vitals:   09/05/15 1250 09/05/15 1253  BP:    Pulse: 62 65  Resp: 14 16  Temp:      Last Pain:  Vitals:   09/05/15 1230  TempSrc:   PainSc: 0-No pain                 Makayela Secrest A

## 2015-09-05 NOTE — Interval H&P Note (Signed)
History and Physical Interval Note:  09/05/2015 11:06 AM  [name]  has presented today for surgery, with the diagnosis of LEFT BREAST CANCER  The various methods of treatment have been discussed with the patient and family. After consideration of risks, benefits and other options for treatment, the patient has consented to  Procedure(s): RE-EXCISION OF LEFT BREAST LUMPECTOMY (Left) as a surgical intervention .  The patient's history has been reviewed, patient examined, no change in status, stable for surgery.  I have reviewed the patient's chart and labs.  Questions were answered to the patient's satisfaction.     Arneda Sappington

## 2015-09-05 NOTE — H&P (Signed)
[name] is an 82 y.o. female.   Chief Complaint: breast cancer HPI:  16 yof s/p left breast mass excisional biopsy who is doing well. she reports no complaints. her path is idc two foci one measuring 3 cm the other 34mm. there is dcis. dcis focally less than 1 mm to superior margin and invasive cancer present at margin. there is lvi and this is 100% er/pr positive.   Past Medical History:  Diagnosis Date  . Anticoagulated on Coumadin   . Anxiety    denies  . Bilateral edema of lower extremity   . Cancer ([place])    left breast  . Chronic constipation   . Diverticulosis of colon   . GERD (gastroesophageal reflux disease)   . H/O hiatal hernia   . History of cellulitis    LEFT LOWER LEG --  SEPT 2015  . History of colitis    DX  2003  WITH ISCHEMIC COLITIS  . History of diverticulitis of colon    perferated diverticulitis w/ abscess 08-25-2013 drain placed --  resolved without surgical intervention  . History of GI bleed    2011--- UPPER SECONARY GASTRIC ULCER FROM NSAID USE  . History of Helicobacter pylori infection    2011  . History of ulcer of lower limb    2012   RIGHT LOWER EXTREMITIY--  STATSIS ULCER  . Hyperlipidemia   . Hypertension   . Hypothyroidism   . Iron deficiency anemia 2011   elevated MCV  . LBP (low back pain)    severe lumbar spondylosis s/p L3/L4,L4/L5 fusion 12/10  . Lumbar spondylolysis   . OA (osteoarthritis)    "hands" (08/25/2013)  . Open wound of left lower leg   . Osteopenia   . Persistent atrial fibrillation ([place])    CARDIOLOGIST-   DR MCALANY  . RBBB    at fib  . Urinary frequency     Past Surgical History:  Procedure Laterality Date  . APPLICATION OF A-CELL OF EXTREMITY Left 06/21/2013   Procedure: APPLICATION OF A-CELL OF EXTREMITY;  Surgeon: Theodoro Kos, DO;  Location: [place];  Service: Plastics;  Laterality: Left;  . APPLICATION OF A-CELL OF EXTREMITY Left 08/22/2013   Procedure: APPLICATION OF A-CELL/VAC TO LOWER LEFT LEG  WOUND;  Surgeon: Theodoro Kos, DO;  Location: [place];  Service: Plastics;  Laterality: Left;  . APPLICATION OF A-CELL OF EXTREMITY Left 02/06/2014   Procedure: WITH PLACEMENT OF A -CELL ;  Surgeon: Theodoro Kos, DO;  Location: [place];  Service: Plastics;  Laterality: Left;  . APPLICATION OF A-CELL OF EXTREMITY Left 08/09/2014   Procedure: PLACEMENT OF A CELL;  Surgeon: Theodoro Kos, DO;  Location: [place];  Service: Plastics;  Laterality: Left;  . APPLICATION OF WOUND VAC Left 06/16/2013   Procedure: APPLICATION OF WOUND VAC;  Surgeon: Meredith Pel, MD;  Location: [place];  Service: Orthopedics;  Laterality: Left;  . APPLICATION OF WOUND VAC Left 06/21/2013   Procedure: APPLICATION OF WOUND VAC;  Surgeon: Theodoro Kos, DO;  Location: [place];  Service: Plastics;  Laterality: Left;  . BIOPSY BREAST  07/25/2015   LEFT RADIOACTIVE SEED GUIDED EXCISIONAL BREAST BIOPSY (Left) as a surgical intervention  . EXTERNAL FIXATION LEG Left 06/16/2013   Procedure: EXTERNAL FIXATION LEG;  Surgeon: Meredith Pel, MD;  Location: [place];  Service: Orthopedics;  Laterality: Left;  . EXTERNAL FIXATION REMOVAL Left 06/21/2013   Procedure: REMOVAL EXTERNAL FIXATION LEG;  Surgeon: Belenda Cruise  Alphonzo Severance, MD;  Location: [place];  Service: Orthopedics;  Laterality: Left;  . EYE SURGERY Bilateral    cataract removal  . HARDWARE REMOVAL Left 05/23/2014   Procedure: HARDWARE REMOVAL;  Surgeon: Meredith Pel, MD;  Location: [place];  Service: Orthopedics;  Laterality: Left;  REMOVAL OF HARDWARE LEFT TIBIA  . I&D EXTREMITY Left 06/16/2013   Procedure: IRRIGATION AND DEBRIDEMENT EXTREMITY;  Surgeon: Meredith Pel, MD;  Location: [place];  Service: Orthopedics;  Laterality: Left;  . I&D EXTREMITY Left 02/06/2014   Procedure: IRRIGATION AND DEBRIDEMENT LEFT LEG WOUND ;  Surgeon: Theodoro Kos, DO;  Location: [place];  Service: Plastics;  Laterality: Left;  . I&D EXTREMITY  Left 08/09/2014   Procedure: IRRIGATION AND DEBRIDEMENT  OF LEFT LOWER LEG;  Surgeon: Theodoro Kos, DO;  Location: [place];  Service: Plastics;  Laterality: Left;  . LUMBAR FUSION  01-02-2009   L3-L5  . RADIOACTIVE SEED GUIDED EXCISIONAL BREAST BIOPSY Left 07/25/2015   Procedure: LEFT RADIOACTIVE SEED GUIDED EXCISIONAL BREAST BIOPSY;  Surgeon: Rolm Bookbinder, MD;  Location: [place];  Service: General;  Laterality: Left;  . SHOULDER OPEN ROTATOR CUFF REPAIR Left 1997  . TIBIA IM NAIL INSERTION Left 06/21/2013   Procedure: INTRAMEDULLARY (IM) NAIL TIBIAL;  Surgeon: Meredith Pel, MD;  Location: [place];  Service: Orthopedics;  Laterality: Left;  . TONSILLECTOMY  AS CHILD  . TOTAL HIP ARTHROPLASTY Right 06-07-2010  . TRANSTHORACIC ECHOCARDIOGRAM  09-11-2010   DR MCALHANY   LVSF  55-60%/  MILD MV CALCIFICATION WITH NO STENOSIS/  MILD MR & TR / MODERATE LAE/  MODERATE TO SEVERE RAE  . UPPER GI ENDOSCOPY  03-29-2010    Family History  Problem Relation Age of Onset  . Cancer Father   . Hypertension Other    Social History:  reports that she has never smoked. She has never used smokeless tobacco. She reports that she does not drink alcohol or use drugs.  Allergies:  Allergies  Allergen Reactions  . Zosyn [Piperacillin Sod-Tazobactam So] Hives, Rash and Other (See Comments)    Morbilliform eruption with itching  . Atenolol Nausea And Vomiting  . Clarithromycin Nausea And Vomiting  . Codeine Sulfate Nausea Only  . Levaquin [Levofloxacin] Nausea Only  . Macrodantin Other (See Comments)    UNSPECIFIED   . Oxycodone-Acetaminophen Nausea And Vomiting  . Percocet [Oxycodone-Acetaminophen] Nausea And Vomiting    Medications Prior to Admission  Medication Sig Dispense Refill  . acetaminophen (TYLENOL) 500 MG tablet Take 500 mg by mouth daily as needed for mild pain.     [name] anastrozole (ARIMIDEX) 1 MG tablet Take 1 mg by mouth daily.    [name] atorvastatin (LIPITOR) 10 MG tablet Take 1 tablet (10 mg  total) by mouth every evening. 90 tablet 3  . B Complex-C (B-COMPLEX WITH VITAMIN C) tablet Take 1 tablet by mouth daily.    . busPIRone (BUSPAR) 7.5 MG tablet Take 1 tablet (7.5 mg total) by mouth 2 (two) times daily. 180 tablet 3  . Cholecalciferol (VITAMIN D3) 1000 UNITS tablet Take 1,000 Units by mouth daily.      [name] diltiazem (CARDIZEM CD) 120 MG 24 hr capsule TAKE 1 CAPSULE (120 MG TOTAL) BY MOUTH DAILY. 90 capsule 3  . doxycycline (VIBRAMYCIN) 100 MG capsule Take 100 mg by mouth 2 (two) times daily.    . Ferrous Sulfate (IRON) 325 (65 FE) MG TABS Take 1 tablet by mouth daily.    . furosemide (  LASIX) 40 MG tablet Take 1 tablet (40 mg total) by mouth as directed. 1 po on Mon-Wed-Fri in am 90 tablet 3  . KLOR-CON M10 10 MEQ tablet Take 1 tablet (10 mEq total) by mouth daily. 90 tablet 3  . levothyroxine (SYNTHROID, LEVOTHROID) 25 MCG tablet TAKE 1 TABLET BY MOUTH ONCE DAILY FOR THYROID 90 tablet 3  . metoprolol succinate (TOPROL-XL) 50 MG 24 hr tablet Take 1 tablet (50 mg total) by mouth 2 (two) times daily. Take with or immediately following a meal. 180 tablet 2  . Multiple Vitamins-Minerals (MULTIVITAMIN WITH MINERALS) tablet Take 1 tablet by mouth daily.    [name] omeprazole (PRILOSEC) 20 MG capsule Take 20 mg by mouth daily as needed.    [name] oxybutynin (DITROPAN) 5 MG tablet Take 1 tablet (5 mg total) by mouth 2 (two) times daily. 180 tablet 3  . polyethylene glycol (MIRALAX / GLYCOLAX) packet Take 17 g by mouth daily as needed for moderate constipation.     . temazepam (RESTORIL) 30 MG capsule Take 1 capsule (30 mg total) by mouth at bedtime. 30 capsule 3  . triamcinolone ointment (KENALOG) 0.1 % Apply 1 application topically 2 (two) times daily. On L dist shin dry skin area (Patient taking differently: Apply 1 application topically 2 (two) times daily as needed. On L dist shin dry skin area) 30 g 1  . vitamin C (ASCORBIC ACID) 500 MG tablet Take 500 mg by mouth daily.    [name] warfarin (COUMADIN) 5  MG tablet TAKE AS DIRECTED BY THERE COUMADIN CLINIC (Patient taking differently: Take 2.5-5 mg by mouth daily at 6 PM. TAKE AS DIRECTED BY THERE COUMADIN CLINIC - 5 mg Mon and Fri. 2.5 mg all other days) 30 tablet 3    No results found for this or any previous visit (from the past 48 hour(s)). No results found.  ROS Negative  Blood pressure (!) 166/89, pulse 71, temperature 98.6 F (37 C), temperature source Oral, resp. rate 18, height 5\' 3"  (1.6 m), weight 80.7 kg (178 lb), SpO2 97 %. Physical Exam   Vitals Weight: 179 lb Height: 63in Body Surface Area: 1.84 m Body Mass Index: 31.71 kg/m  Temp.: 98.64F(Temporal)  Pulse: 76 (Regular)  BP: 126/80 (Sitting, Left Arm, Standard)   Physical Exam  Breast Note: left breast incision clean without infection cv rrr pulm clear bilaterally  Assessment/Plan BREAST CANCER OF UPPER-OUTER QUADRANT OF LEFT FEMALE BREAST (C50.412) Story: I discussed her diagnosis. discussed pathology report. I think with her age, cormorbidities best plan will be re-excision of this superior margin and then antiestrogen therapy. I will refer her to see medical oncology and schedule surgery for after her husbands 90[birth year] birthday which is [birth year].    Rolm Bookbinder, MD 09/05/2015, 10:05 AM

## 2015-09-05 NOTE — Anesthesia Procedure Notes (Signed)
Procedure Name: LMA Insertion Date/Time: 09/05/2015 11:21 AM Performed by: Everlean Cherry A Pre-anesthesia Checklist: Patient identified, Emergency Drugs available, Suction available and Patient being monitored Patient Re-evaluated:Patient Re-evaluated prior to inductionOxygen Delivery Method: Circle system utilized Preoxygenation: Pre-oxygenation with 100% oxygen Intubation Type: IV induction Ventilation: Mask ventilation without difficulty LMA: LMA inserted LMA Size: 4.0 Number of attempts: 1 Tube secured with: Tape Dental Injury: Teeth and Oropharynx as per pre-operative assessment

## 2015-09-05 NOTE — Transfer of Care (Signed)
Immediate Anesthesia Transfer of Care Note  Patient: [name]  Procedure(s) Performed: Procedure(s): RE-EXCISION OF LEFT BREAST LUMPECTOMY (Left)  Patient Location: PACU  Anesthesia Type:General  Level of Consciousness: awake, alert , oriented and patient cooperative  Airway & Oxygen Therapy: Patient Spontanous Breathing and Patient connected to nasal cannula oxygen  Post-op Assessment: Report given to RN and Post -op Vital signs reviewed and stable  Post vital signs: Reviewed and stable  Last Vitals:  Vitals:   09/05/15 0934 09/05/15 1159  BP: (!) 166/89   Pulse: 71   Resp: 18   Temp: 37 C (P) 36.4 C    Last Pain:  Vitals:   09/05/15 0934  TempSrc: Oral      Patients Stated Pain Goal: 2 ([id number])  Complications: No apparent anesthesia complications

## 2015-09-05 NOTE — Discharge Instructions (Signed)
[facility] °Office Phone Number [phone] ° °BREAST BIOPSY/ PARTIAL MASTECTOMY: POST OP INSTRUCTIONS ° °Always review your discharge instruction sheet given to you by the facility where your surgery was performed. ° °IF YOU HAVE DISABILITY OR FAMILY LEAVE FORMS, YOU MUST BRING THEM TO THE OFFICE FOR PROCESSING.  DO NOT GIVE THEM TO YOUR DOCTOR. ° °1. A prescription for pain medication may be given to you upon discharge.  Take your pain medication as prescribed, if needed.  If narcotic pain medicine is not needed, then you may take acetaminophen (Tylenol), naprosyn (Alleve) or ibuprofen (Advil) as needed. °2. Take your usually prescribed medications unless otherwise directed °3. If you need a refill on your pain medication, please contact your pharmacy.  They will contact our office to request authorization.  Prescriptions will not be filled after 5pm or on week-ends. °4. You should eat very light the first 24 hours after surgery, such as soup, crackers, pudding, etc.  Resume your normal diet the day after surgery. °5. Most patients will experience some swelling and bruising in the breast.  Ice packs and a good support bra will help.  Wear the breast binder provided or a sports bra for 72 hours day and night.  After that wear a sports bra during the day until you return to the office. Swelling and bruising can take several days to resolve.  °6. It is common to experience some constipation if taking pain medication after surgery.  Increasing fluid intake and taking a stool softener will usually help or prevent this problem from occurring.  A mild laxative (Milk of Magnesia or Miralax) should be taken according to package directions if there are no bowel movements after 48 hours. °7. Unless discharge instructions indicate otherwise, you may remove your bandages 48 hours after surgery and you may shower at that time.  You may have steri-strips (small skin tapes) in place directly over the incision.   These strips should be left on the skin for 7-10 days and will come off on their own.  If your surgeon used skin glue on the incision, you may shower in 24 hours.  The glue will flake off over the next 2-3 weeks.  Any sutures or staples will be removed at the office during your follow-up visit. °8. ACTIVITIES:  You may resume regular daily activities (gradually increasing) beginning the next day.  Wearing a good support bra or sports bra minimizes pain and swelling.  You may have sexual intercourse when it is comfortable. °a. You may drive when you no longer are taking prescription pain medication, you can comfortably wear a seatbelt, and you can safely maneuver your car and apply brakes. °b. RETURN TO WORK:  ______________________________________________________________________________________ °9. You should see your doctor in the office for a follow-up appointment approximately two weeks after your surgery.  Your doctor’s nurse will typically make your follow-up appointment when she calls you with your pathology report.  Expect your pathology report 3-4 business days after your surgery.  You may call to check if you do not hear from us after three days. °10. OTHER INSTRUCTIONS: _______________________________________________________________________________________________ _____________________________________________________________________________________________________________________________________ °_____________________________________________________________________________________________________________________________________ °_____________________________________________________________________________________________________________________________________ ° °WHEN TO CALL DR Izreal Kock: °1. Fever over 101.0 °2. Nausea and/or vomiting. °3. Extreme swelling or bruising. °4. Continued bleeding from incision. °5. Increased pain, redness, or drainage from the incision. ° °The clinic staff is available to  answer your questions during regular business hours.  Please don’t hesitate to call and ask to speak to one of the nurses for   clinical concerns.  If you have a medical emergency, go to the nearest emergency room or call 911.  A surgeon from [facility] is always on call at the hospital. ° °For further questions, please visit [email or web address] ° °

## 2015-09-05 NOTE — Anesthesia Preprocedure Evaluation (Signed)
Anesthesia Evaluation  Patient identified by MRN, date of birth, ID band Patient awake    Reviewed: Allergy & Precautions, NPO status , Patient's Chart, lab work & pertinent test results  Airway Mallampati: I  TM Distance: >3 FB Neck ROM: Full    Dental  (+) Teeth Intact, Dental Advisory Given   Pulmonary    breath sounds clear to auscultation       Cardiovascular hypertension, Pt. on medications + CABG   Rhythm:Regular Rate:Normal     Neuro/Psych    GI/Hepatic GERD  Medicated and Controlled,  Endo/Other  Morbid obesity  Renal/GU      Musculoskeletal   Abdominal   Peds  Hematology   Anesthesia Other Findings   Reproductive/Obstetrics                             Anesthesia Physical Anesthesia Plan  ASA: III  Anesthesia Plan: General   Post-op Pain Management:    Induction: Intravenous  Airway Management Planned: LMA  Additional Equipment:   Intra-op Plan:   Post-operative Plan: Extubation in OR  Informed Consent: I have reviewed the patients History and Physical, chart, labs and discussed the procedure including the risks, benefits and alternatives for the proposed anesthesia with the patient or authorized representative who has indicated his/her understanding and acceptance.   Dental advisory given  Plan Discussed with: CRNA, Anesthesiologist and Surgeon  Anesthesia Plan Comments:         Anesthesia Quick Evaluation

## 2015-09-05 NOTE — Progress Notes (Signed)
Dr. Donne Hazel made aware about patient's concern about staying overnight. Dr. Donne Hazel talked to family members earlier and decided for patient to go home. After patient talked to family members, she/family decided that she would go home.

## 2015-09-05 NOTE — Progress Notes (Signed)
Dr Donne Hazel called and asked about allergy to Zosyn and Ancef ordered. Dr Donne Hazel states not to give any thing.

## 2015-09-05 NOTE — Op Note (Signed)
Preoperative diagnoses: clinical stage 2 left breast cancer with positive superior margin Postoperative diagnosis: Same as above Procedure:re-excision superior margin right breast s/p lumpectomy Surgeon: Dr. Serita Grammes Anesthesia: Gen. Estimated blood loss: Minimal Complications: None Drains: None Specimens:leftbreast tissue superior margin marked with paint Sponge and needle count correct at completion Disposition to recovery stable  Indications: This is an 69 yof who underwent excision of left breast mass that was benign on core. This ended up being a 3 cm cancer with an additional idc next to it.  Margin is positive superiorly we discussed returning to or for re-excision.    Procedure:After informed consent was obtained she was then taken to the operating room.  Sequential compression devices on her legs. She was placed under general anesthesia without complication. Her leftbreast was then prepped and draped in the standard sterile surgical fashion. A surgical timeout was then performed.  I infiltrated marcaine and reentered her old incision. I released all the old sutures.  I then removed the entire superior margin and marked this with paint.  This was then sent to pathology. I placed clips at this site.  Hemostasis was observed.I closed the breast tissue with a 2-0 Vicryl. The dermis was closed with 3-0 Vicryl and the skin with 4-0 Monocryl.Dermabond and steristrips were placed on the incision. She was transferred to recovery stable

## 2015-09-06 ENCOUNTER — Encounter (HOSPITAL_COMMUNITY): Payer: Self-pay | Admitting: General Surgery

## 2015-09-14 ENCOUNTER — Ambulatory Visit (INDEPENDENT_AMBULATORY_CARE_PROVIDER_SITE_OTHER): Payer: Medicare Other | Admitting: Pharmacist

## 2015-09-14 DIAGNOSIS — Z5181 Encounter for therapeutic drug level monitoring: Secondary | ICD-10-CM

## 2015-09-14 DIAGNOSIS — I4892 Unspecified atrial flutter: Secondary | ICD-10-CM

## 2015-09-14 LAB — POCT INR: INR: 1.3

## 2015-09-21 ENCOUNTER — Ambulatory Visit (INDEPENDENT_AMBULATORY_CARE_PROVIDER_SITE_OTHER): Payer: Medicare Other | Admitting: *Deleted

## 2015-09-21 DIAGNOSIS — I4892 Unspecified atrial flutter: Secondary | ICD-10-CM | POA: Diagnosis not present

## 2015-09-21 DIAGNOSIS — Z5181 Encounter for therapeutic drug level monitoring: Secondary | ICD-10-CM | POA: Diagnosis not present

## 2015-09-21 LAB — POCT INR: INR: 2.2

## 2015-10-02 ENCOUNTER — Encounter: Payer: Self-pay | Admitting: Internal Medicine

## 2015-10-02 DIAGNOSIS — Z85828 Personal history of other malignant neoplasm of skin: Secondary | ICD-10-CM | POA: Diagnosis not present

## 2015-10-02 DIAGNOSIS — L905 Scar conditions and fibrosis of skin: Secondary | ICD-10-CM | POA: Diagnosis not present

## 2015-10-02 DIAGNOSIS — D0359 Melanoma in situ of other part of trunk: Secondary | ICD-10-CM | POA: Diagnosis not present

## 2015-10-05 ENCOUNTER — Ambulatory Visit (INDEPENDENT_AMBULATORY_CARE_PROVIDER_SITE_OTHER): Payer: Medicare Other | Admitting: Pharmacist

## 2015-10-05 ENCOUNTER — Encounter (INDEPENDENT_AMBULATORY_CARE_PROVIDER_SITE_OTHER): Payer: Self-pay

## 2015-10-05 DIAGNOSIS — Z5181 Encounter for therapeutic drug level monitoring: Secondary | ICD-10-CM | POA: Diagnosis not present

## 2015-10-05 DIAGNOSIS — I4892 Unspecified atrial flutter: Secondary | ICD-10-CM | POA: Diagnosis not present

## 2015-10-05 LAB — POCT INR: INR: 2.2

## 2015-10-09 ENCOUNTER — Other Ambulatory Visit: Payer: Self-pay | Admitting: *Deleted

## 2015-10-09 ENCOUNTER — Telehealth: Payer: Self-pay | Admitting: *Deleted

## 2015-10-09 MED ORDER — METOPROLOL SUCCINATE ER 50 MG PO TB24
50.0000 mg | ORAL_TABLET | Freq: Two times a day (BID) | ORAL | 3 refills | Status: DC
Start: 2015-10-09 — End: 2016-10-28

## 2015-10-09 MED ORDER — ANASTROZOLE 1 MG PO TABS: 1.0000 mg | ORAL_TABLET | Freq: Every day | ORAL | 3 refills | Status: DC

## 2015-10-09 MED ORDER — BENZONATATE 200 MG PO CAPS: 200.0000 mg | ORAL_CAPSULE | Freq: Two times a day (BID) | ORAL | 5 refills | Status: DC | PRN

## 2015-10-09 MED ORDER — OXYBUTYNIN CHLORIDE 5 MG PO TABS: 5.0000 mg | ORAL_TABLET | Freq: Two times a day (BID) | ORAL | 3 refills | Status: DC

## 2015-10-09 MED ORDER — DILTIAZEM HCL ER COATED BEADS 120 MG PO CP24: ORAL_CAPSULE | ORAL | 3 refills | Status: DC

## 2015-10-09 MED ORDER — WARFARIN SODIUM 5 MG PO TABS: ORAL_TABLET | ORAL | 3 refills | Status: DC

## 2015-10-09 NOTE — Telephone Encounter (Signed)
Sent in pt's Warfarin refill to [facility] as electronically requested and upon transmission received a message that transmission failed.  Therefore, called Baldo Ash at [facility] she took a verbal order for the Warfarin Refill.  Also, she stated at this time they do not have e-scribe available because they have been renamed to [facility] fax number is [phone] (only for paper faxes).

## 2015-10-09 NOTE — Telephone Encounter (Signed)
Please refill.

## 2015-10-09 NOTE — Addendum Note (Signed)
Addended by: Earnstine Regal on: 10/09/2015 08:58 AM   Modules accepted: Orders

## 2015-10-11 ENCOUNTER — Encounter: Payer: Self-pay | Admitting: Cardiovascular Disease

## 2015-10-11 ENCOUNTER — Other Ambulatory Visit: Payer: Self-pay | Admitting: *Deleted

## 2015-10-11 MED ORDER — DOXYCYCLINE HYCLATE 100 MG PO CAPS: 100.0000 mg | ORAL_CAPSULE | Freq: Two times a day (BID) | ORAL | 5 refills | Status: DC

## 2015-10-12 ENCOUNTER — Ambulatory Visit (INDEPENDENT_AMBULATORY_CARE_PROVIDER_SITE_OTHER): Payer: Medicare Other | Admitting: Cardiovascular Disease

## 2015-10-12 VITALS — BP 120/80 | HR 79 | Ht 63.0 in | Wt 182.8 lb

## 2015-10-12 DIAGNOSIS — I10 Essential (primary) hypertension: Secondary | ICD-10-CM

## 2015-10-12 DIAGNOSIS — I481 Persistent atrial fibrillation: Secondary | ICD-10-CM

## 2015-10-12 DIAGNOSIS — I4819 Other persistent atrial fibrillation: Secondary | ICD-10-CM

## 2015-10-12 NOTE — Progress Notes (Signed)
Chief Complaint  Patient presents with  . Atrial Flutter      History of Present Illness: 82 yo WF with history of HTN, chronic low back pain, iron deficiency anemia and paroxysmal atrial fibrillation/flutter who is here today for follow up. She has had chronic atrial flutter/fibrillation over the past 3 years and has been managed on coumadin and rate control agents. In 2011 she had dark stools with anemia. Coumadin was held and upper endoscopy showed several small clean based gastric ulcers. She was admitted October 2011 and found to be dehydrated with acute renal failure. She was hydrated and this resolved. No further evidence of GI bleeding. She had several episodes of RVR with her atrial fib in the hospital. Coumadin has been restarted without evidence of further GI bleeding.  She had her right hip replaced per Dr. Rich Brave in May 2012. She has had bilateral lower ext swelling since her hip replacement in May 2012. She was started on lasix 40 mg po BID. This seemed to help the swelling. Venous dopplers negative for DVT. Fall June 2015 which led to an open fracture of the left leg tibia and fibula requiring surgical intervention and wound care with wound VAC. Patient was at [facility] when she developed LLQ pain. CT scan of the abdomen showed inflammatory changes in the pelvis and right lower quadrant with wall thickening of small bowel adjacent loops concerning for ruptured diverticulitis and abscess. General Surgery was consulted and she was started on IV Cipro and Flagyl. She had a drain placed transgluteally to drain a pelvic abscess by IR which was removed on 09/06/13. She has had continued issues with healing of her leg wound. She has been diagnosed with left breast cancer and has had lumpectomy per Dr. Donne Hazel July 2017. She is on oral therapy. She has also had resection of lesion on the skin on her lower back.   She is here today for follow up. She has not been aware of any tachycardia or  skipped beats. No chest pain or SOB. No bleeding issues.   Primary Care Physician: Walker Kehr, MD   Past Medical History:  Diagnosis Date  . Anticoagulated on Coumadin   . Anxiety    denies  . Bilateral edema of lower extremity   . Cancer ([place])    left breast  . Chronic constipation   . Diverticulosis of colon   . GERD (gastroesophageal reflux disease)   . H/O hiatal hernia   . History of cellulitis    LEFT LOWER LEG --  SEPT 2015  . History of colitis    DX  2003  WITH ISCHEMIC COLITIS  . History of diverticulitis of colon    perferated diverticulitis w/ abscess 08-25-2013 drain placed --  resolved without surgical intervention  . History of GI bleed    2011--- UPPER SECONARY GASTRIC ULCER FROM NSAID USE  . History of Helicobacter pylori infection    2011  . History of ulcer of lower limb    2012   RIGHT LOWER EXTREMITIY--  STATSIS ULCER  . Hyperlipidemia   . Hypertension   . Hypothyroidism   . Iron deficiency anemia 2011   elevated MCV  . LBP (low back pain)    severe lumbar spondylosis s/p L3/L4,L4/L5 fusion 12/10  . Lumbar spondylolysis   . OA (osteoarthritis)    "hands" (08/25/2013)  . Open wound of left lower leg   . Osteopenia   . Persistent atrial fibrillation ([facility])    CARDIOLOGIST-  DR Burman Foster  . RBBB    at fib  . Urinary frequency     Past Surgical History:  Procedure Laterality Date  . APPLICATION OF A-CELL OF EXTREMITY Left 06/21/2013   Procedure: APPLICATION OF A-CELL OF EXTREMITY;  Surgeon: Theodoro Kos, DO;  Location: [place];  Service: Plastics;  Laterality: Left;  . APPLICATION OF A-CELL OF EXTREMITY Left 08/22/2013   Procedure: APPLICATION OF A-CELL/VAC TO LOWER LEFT LEG WOUND;  Surgeon: Theodoro Kos, DO;  Location: [place];  Service: Plastics;  Laterality: Left;  . APPLICATION OF A-CELL OF EXTREMITY Left 02/06/2014   Procedure: WITH PLACEMENT OF A -CELL ;  Surgeon: Theodoro Kos, DO;  Location: [place];   Service: Plastics;  Laterality: Left;  . APPLICATION OF A-CELL OF EXTREMITY Left 08/09/2014   Procedure: PLACEMENT OF A CELL;  Surgeon: Theodoro Kos, DO;  Location: [place];  Service: Plastics;  Laterality: Left;  . APPLICATION OF WOUND VAC Left 06/16/2013   Procedure: APPLICATION OF WOUND VAC;  Surgeon: Meredith Pel, MD;  Location: [place];  Service: Orthopedics;  Laterality: Left;  . APPLICATION OF WOUND VAC Left 06/21/2013   Procedure: APPLICATION OF WOUND VAC;  Surgeon: Theodoro Kos, DO;  Location: [place];  Service: Plastics;  Laterality: Left;  . BIOPSY BREAST  07/25/2015   LEFT RADIOACTIVE SEED GUIDED EXCISIONAL BREAST BIOPSY (Left) as a surgical intervention  . EXTERNAL FIXATION LEG Left 06/16/2013   Procedure: EXTERNAL FIXATION LEG;  Surgeon: Meredith Pel, MD;  Location: [place];  Service: Orthopedics;  Laterality: Left;  . EXTERNAL FIXATION REMOVAL Left 06/21/2013   Procedure: REMOVAL EXTERNAL FIXATION LEG;  Surgeon: Meredith Pel, MD;  Location: [place];  Service: Orthopedics;  Laterality: Left;  . EYE SURGERY Bilateral    cataract removal  . HARDWARE REMOVAL Left 05/23/2014   Procedure: HARDWARE REMOVAL;  Surgeon: Meredith Pel, MD;  Location: [place];  Service: Orthopedics;  Laterality: Left;  REMOVAL OF HARDWARE LEFT TIBIA  . I&D EXTREMITY Left 06/16/2013   Procedure: IRRIGATION AND DEBRIDEMENT EXTREMITY;  Surgeon: Meredith Pel, MD;  Location: [place];  Service: Orthopedics;  Laterality: Left;  . I&D EXTREMITY Left 02/06/2014   Procedure: IRRIGATION AND DEBRIDEMENT LEFT LEG WOUND ;  Surgeon: Theodoro Kos, DO;  Location: [place];  Service: Plastics;  Laterality: Left;  . I&D EXTREMITY Left 08/09/2014   Procedure: IRRIGATION AND DEBRIDEMENT  OF LEFT LOWER LEG;  Surgeon: Theodoro Kos, DO;  Location[place];  Service: Plastics;  Laterality: Left;  . LUMBAR FUSION  01-02-2009   L3-L5  . RADIOACTIVE SEED GUIDED EXCISIONAL BREAST BIOPSY Left 07/25/2015    Procedure: LEFT RADIOACTIVE SEED GUIDED EXCISIONAL BREAST BIOPSY;  Surgeon: Rolm Bookbinder, MD;  Location: [place];  Service: General;  Laterality: Left;  . RE-EXCISION OF BREAST LUMPECTOMY Left 09/05/2015   Procedure: RE-EXCISION OF LEFT BREAST LUMPECTOMY;  Surgeon: Rolm Bookbinder, MD;  Location: [place];  Service: General;  Laterality: Left;  . SHOULDER OPEN ROTATOR CUFF REPAIR Left 1997  . TIBIA IM NAIL INSERTION Left 06/21/2013   Procedure: INTRAMEDULLARY (IM) NAIL TIBIAL;  Surgeon: Meredith Pel, MD;  Location: [place];  Service: Orthopedics;  Laterality: Left;  . TONSILLECTOMY  AS CHILD  . TOTAL HIP ARTHROPLASTY Right 06-07-2010  . TRANSTHORACIC ECHOCARDIOGRAM  09-11-2010   DR Elijah Michaelis   LVSF  55-60%/  MILD MV CALCIFICATION WITH NO STENOSIS/  MILD MR & TR / MODERATE LAE/  MODERATE TO  SEVERE RAE  . UPPER GI ENDOSCOPY  03-29-2010    Current Outpatient Prescriptions  Medication Sig Dispense Refill  . acetaminophen (TYLENOL) 500 MG tablet Take 500 mg by mouth daily as needed for mild pain.     [name] anastrozole (ARIMIDEX) 1 MG tablet Take 1 tablet (1 mg total) by mouth daily. 90 tablet 3  . atorvastatin (LIPITOR) 10 MG tablet Take 1 tablet (10 mg total) by mouth every evening. 90 tablet 3  . B Complex-C (B-COMPLEX WITH VITAMIN C) tablet Take 1 tablet by mouth daily.    . benzonatate (TESSALON) 200 MG capsule Take 1 capsule (200 mg total) by mouth 2 (two) times daily as needed for cough. 60 capsule 5  . busPIRone (BUSPAR) 7.5 MG tablet Take 1 tablet (7.5 mg total) by mouth 2 (two) times daily. 180 tablet 3  . Cholecalciferol (VITAMIN D3) 1000 UNITS tablet Take 1,000 Units by mouth daily.      [name] diltiazem (CARDIZEM CD) 120 MG 24 hr capsule TAKE 1 CAPSULE (120 MG TOTAL) BY MOUTH DAILY. 90 capsule 3  . doxycycline (VIBRAMYCIN) 100 MG capsule Take 1 capsule (100 mg total) by mouth 2 (two) times daily. 60 capsule 5  . Ferrous Sulfate (IRON) 325 (65 FE) MG TABS Take 1 tablet by mouth daily.    .  furosemide (LASIX) 40 MG tablet Take 1 tablet (40 mg total) by mouth as directed. 1 po on Mon-Wed-Fri in am 90 tablet 3  . KLOR-CON M10 10 MEQ tablet Take 1 tablet (10 mEq total) by mouth daily. 90 tablet 3  . levothyroxine (SYNTHROID, LEVOTHROID) 25 MCG tablet TAKE 1 TABLET BY MOUTH ONCE DAILY FOR THYROID 90 tablet 3  . metoprolol succinate (TOPROL-XL) 50 MG 24 hr tablet Take 1 tablet (50 mg total) by mouth 2 (two) times daily. Take with or immediately following a meal. 180 tablet 3  . Multiple Vitamins-Minerals (MULTIVITAMIN WITH MINERALS) tablet Take 1 tablet by mouth daily.    [name] omeprazole (PRILOSEC) 20 MG capsule Take 20 mg by mouth daily as needed.    [name] oxybutynin (DITROPAN) 5 MG tablet Take 1 tablet (5 mg total) by mouth 2 (two) times daily. 180 tablet 3  . polyethylene glycol (MIRALAX / GLYCOLAX) packet Take 17 g by mouth daily as needed for moderate constipation.     . temazepam (RESTORIL) 30 MG capsule Take 1 capsule (30 mg total) by mouth at bedtime. 30 capsule 3  . traMADol (ULTRAM) 50 MG tablet Take 1-2 tablets (50-100 mg total) by mouth every 6 (six) hours as needed. 10 tablet 0  . triamcinolone ointment (KENALOG) 0.1 % Apply 1 application topically 2 (two) times daily. On L dist shin dry skin area (Patient taking differently: Apply 1 application topically 2 (two) times daily as needed. On L dist shin dry skin area) 30 g 1  . vitamin C (ASCORBIC ACID) 500 MG tablet Take 500 mg by mouth daily.    [name] warfarin (COUMADIN) 5 MG tablet TAKE AS DIRECTED BY THE COUMADIN CLINIC 30 tablet 3   No current facility-administered medications for this visit.     Allergies  Allergen Reactions  . Zosyn [Piperacillin Sod-Tazobactam So] Hives, Rash and Other (See Comments)    Morbilliform eruption with itching  . Atenolol Nausea And Vomiting  . Clarithromycin Nausea And Vomiting  . Codeine Sulfate Nausea Only  . Levaquin [Levofloxacin] Nausea Only  . Macrodantin Other (See Comments)    UNSPECIFIED    . Oxycodone-Acetaminophen Nausea And  Vomiting  . Percocet [Oxycodone-Acetaminophen] Nausea And Vomiting    Social History   Social History  . Marital status: Married    Spouse name: N/A  . Number of children: 2  . Years of education: N/A   Occupational History  . retired Unemployed   Social History Main Topics  . Smoking status: Never Smoker  . Smokeless tobacco: Never Used  . Alcohol use No     Comment: 1 glass occ wine 08/03/14- none in a year  . Drug use: No  . Sexual activity: Not on file   Other Topics Concern  . Not on file   Social History Narrative  . No narrative on file    Family History  Problem Relation Age of Onset  . Cancer Father   . Hypertension Other     Review of Systems:  As stated in the HPI and otherwise negative.   BP 120/80   Pulse 79   Ht 5\' 3"  (1.6 m)   Wt 82.9 kg (182 lb 12.8 oz)   BMI 32.38 kg/m   Physical Examination: General: Well developed, well nourished, NAD  HEENT: OP clear, mucus membranes moist  SKIN: warm, dry. No rashes. Neuro: No focal deficits  Musculoskeletal: Muscle strength 5/5 all ext  Psychiatric: Mood and affect normal  Neck: No JVD, no carotid bruits, no thyromegaly, no lymphadenopathy.  Lungs:Clear bilaterally, no wheezes, rhonci, crackles Cardiovascular: Irregular  rate and rhythm. No murmurs, gallops or rubs. Abdomen:Soft. Bowel sounds present. Non-tender.  Extremities: No lower extremity edema. Pulses are 2 + in the bilateral DP/PT. Chronic venous stasis changes.   EKG:  EKG is not ordered today. The ekg ordered today demonstrates   Recent Labs: 08/10/2015: ALT 21 08/30/2015: BUN 13; Creatinine, Ser 0.78; Hemoglobin 14.4; Platelets 198; Potassium 3.9; Sodium 137   Lipid Panel    Component Value Date/Time   CHOL 116 06/25/2012 1619   TRIG 82 09/05/2013 0300   HDL 41.50 06/25/2012 1619   CHOLHDL 3 06/25/2012 1619   VLDL 8.2 06/25/2012 1619   LDLCALC 66 06/25/2012 1619   LDLDIRECT 110.3 05/24/2008  1041     Wt Readings from Last 3 Encounters:  10/12/15 82.9 kg (182 lb 12.8 oz)  09/05/15 80.7 kg (178 lb)  08/30/15 80.7 kg (178 lb)     Other studies Reviewed: Additional studies/ records that were reviewed today include: . Review of the above records demonstrates:    Assessment and Plan:   1. Atrial fibrillation: Chronic persistent, rate controlled. She is anti-coagulated with coumadin with no bleeding issues. Continue beta blocker and Cardizem.   2. HTN: BP is controlled. Continue Toprol. She is anti-coagulated with coumadin.    Current medicines are reviewed at length with the patient today.  The patient does not have concerns regarding medicines.  The following changes have been made:  no change  Labs/ tests ordered today include:  No orders of the defined types were placed in this encounter.   Disposition:   FU with me in 6 months  Signed, Lauree Chandler, MD 10/12/2015 3:13 PM    [facility] [address], [place], [place]  [address] Phone: [phone]; Fax: [phone]

## 2015-10-12 NOTE — Patient Instructions (Signed)

## 2015-10-16 ENCOUNTER — Encounter: Payer: Self-pay | Admitting: Internal Medicine

## 2015-10-16 DIAGNOSIS — D485 Neoplasm of uncertain behavior of skin: Secondary | ICD-10-CM | POA: Diagnosis not present

## 2015-10-16 DIAGNOSIS — L570 Actinic keratosis: Secondary | ICD-10-CM | POA: Diagnosis not present

## 2015-10-22 ENCOUNTER — Other Ambulatory Visit (HOSPITAL_BASED_OUTPATIENT_CLINIC_OR_DEPARTMENT_OTHER): Payer: Medicare Other

## 2015-10-22 ENCOUNTER — Ambulatory Visit (HOSPITAL_BASED_OUTPATIENT_CLINIC_OR_DEPARTMENT_OTHER): Payer: Medicare Other | Admitting: Oncology

## 2015-10-22 VITALS — BP 141/75 | HR 79 | Temp 97.4°F | Resp 18 | Ht 63.0 in | Wt 180.2 lb

## 2015-10-22 DIAGNOSIS — Z170 Estrogen receptor positive status [ER+]: Secondary | ICD-10-CM

## 2015-10-22 DIAGNOSIS — C50412 Malignant neoplasm of upper-outer quadrant of left female breast: Secondary | ICD-10-CM

## 2015-10-22 DIAGNOSIS — D0582 Other specified type of carcinoma in situ of left breast: Secondary | ICD-10-CM

## 2015-10-22 DIAGNOSIS — C50912 Malignant neoplasm of unspecified site of left female breast: Secondary | ICD-10-CM

## 2015-10-22 DIAGNOSIS — M899 Disorder of bone, unspecified: Secondary | ICD-10-CM

## 2015-10-22 DIAGNOSIS — M949 Disorder of cartilage, unspecified: Secondary | ICD-10-CM

## 2015-10-22 LAB — COMPREHENSIVE METABOLIC PANEL
ALT: 20 U/L (ref 0–55)
AST: 21 U/L (ref 5–34)
Albumin: 3.8 g/dL (ref 3.5–5.0)
Alkaline Phosphatase: 79 U/L (ref 40–150)
Anion Gap: 11 mEq/L (ref 3–11)
BUN: 13.4 mg/dL (ref 7.0–26.0)
CO2: 26 mEq/L (ref 22–29)
Calcium: 9.2 mg/dL (ref 8.4–10.4)
Chloride: 101 mEq/L (ref 98–109)
Creatinine: 0.9 mg/dL (ref 0.6–1.1)
EGFR: 54 mL/min/{1.73_m2} — ABNORMAL LOW (ref >90)
Glucose: 131 mg/dl (ref 70–140)
Potassium: 3.5 mEq/L (ref 3.5–5.1)
Sodium: 138 mEq/L (ref 136–145)
Total Bilirubin: 1.65 mg/dL — ABNORMAL HIGH (ref 0.20–1.20)
Total Protein: 7.8 g/dL (ref 6.4–8.3)

## 2015-10-22 LAB — CBC WITH DIFFERENTIAL/PLATELET
BASO%: 0.9 % (ref 0.0–2.0)
Basophils Absolute: 0.1 10*3/uL (ref 0.0–0.1)
EOS%: 2.1 % (ref 0.0–7.0)
Eosinophils Absolute: 0.1 10*3/uL (ref 0.0–0.5)
HCT: 43.8 % (ref 34.8–46.6)
HGB: 14.2 g/dL (ref 11.6–15.9)
LYMPH%: 25.4 % (ref 14.0–49.7)
MCH: 33.8 pg (ref 25.1–34.0)
MCHC: 32.5 g/dL (ref 31.5–36.0)
MCV: 104.1 fL — ABNORMAL HIGH (ref 79.5–101.0)
MONO#: 0.6 10*3/uL (ref 0.1–0.9)
MONO%: 9.1 % (ref 0.0–14.0)
NEUT#: 4.3 10*3/uL (ref 1.5–6.5)
NEUT%: 62.5 % (ref 38.4–76.8)
Platelets: 214 10*3/uL (ref 145–400)
RBC: 4.2 10*6/uL (ref 3.70–5.45)
RDW: 13.7 % (ref 11.2–14.5)
WBC: 6.9 10*3/uL (ref 3.9–10.3)
lymph#: 1.8 10*3/uL (ref 0.9–3.3)

## 2015-10-22 MED ORDER — ANASTROZOLE 1 MG PO TABS: 1.0000 mg | ORAL_TABLET | Freq: Every day | ORAL | 3 refills | Status: DC

## 2015-10-22 NOTE — Progress Notes (Signed)
[place]  [phone] [phone]) [phone]     ID: YVAINE [name] DOB: May 08, 1925  MR#: [id number]  BHA#:[id number]  Patient Care Team: [name], MD as PCP - General Rolm Bookbinder, MD as Consulting Physician (General Surgery) Chauncey Cruel, MD as Consulting Physician (Oncology) Burnell Blanks, MD as Consulting Physician (Cardiology) Jarome Matin, MD as Consulting Physician (Dermatology) Meredith Pel, MD as Consulting Physician (Orthopedic Surgery) OTHER MD:  CHIEF COMPLAINT: Estrogen receptor positive invasive breast cancer  CURRENT TREATMENT: Anastrozole   BREAST CANCER HISTORY: From the original intake note:  [name] noted a change in her left breast and had bilateral screening mamm[facility] 06/07/2015, with tomosynthesis. Breast density was category A. There was a new oval mass in the left breast central to the nipple. Left breast ultrasound the same day confirmed a 3.4 cm oval mass in the left breast upper outer quadrant, correlating with the mammography. The left axilla was sonographically benign.  Biopsy of the left breast mass in question 06/11/2015 showed ([id number]) a papillary lesion, consistent with an intraductal papilloma, but due to fragmentation the edges of the lesion could not be assessed.  Accordingly the patient was referred to surgery, and after appropriate discussion underwent left lumpectomy 07/25/2015. The final pathology ([id number]) showed 2 foci of invasive ductal carcinoma, grade 2, measuring 3.0 cm, and grade 1, measuring 0.9 cm. There was also ductal carcinoma in situ. The invasive tumor was present at the superior margin, and ductal carcinoma in situ was focally less than 0.1 cm to the same margin. There was evidence of lymphovascular invasion. The prognostic panel showed estrogen receptor 100% positive, progesterone receptor 100% positive, both with strong staining intensity, for both lesions.  HER-2 was also negative for both lesions, the signals ratio being 1.45-1.65, and the number per cell 2.10-2.80.  The patient's subsequent history is as detailed below  INTERVAL HISTORY: [name] returns today for follow-up of her noninvasive breast cancer accompanied by her husband and granddaughter. Since her last visit here she underwent further surgery, on 09/05/2015, for margin clearance. This found ductal carcinoma in situ, grade 2, but now more than 0.2 cm to the superior margin.  She has been on anastrozole since July. She is tolerating it with no flashes or vaginal dryness problems. She is obtaining it at a good price.  REVIEW OF SYSTEMS: [name] did well with her second surgery, without unusual bleeding, fever, swelling, or pain she remains very limited in her functional status. She has had skin lesions removed from her back and upper lip but does not know the pathology. A detailed review of systems today was otherwise stable   PAST MEDICAL HISTORY: Past Medical History:  Diagnosis Date  . Anticoagulated on Coumadin   . Anxiety    denies  . Bilateral edema of lower extremity   . Cancer ([place])    left breast  . Chronic constipation   . Diverticulosis of colon   . GERD (gastroesophageal reflux disease)   . H/O hiatal hernia   . History of cellulitis    LEFT LOWER LEG --  SEPT 2015  . History of colitis    DX  2003  WITH ISCHEMIC COLITIS  . History of diverticulitis of colon    perferated diverticulitis w/ abscess 08-25-2013 drain placed --  resolved without surgical intervention  . History of GI bleed    2011--- UPPER SECONARY GASTRIC ULCER FROM NSAID USE  . History of Helicobacter pylori infection  2011  . History of ulcer of lower limb    2012   RIGHT LOWER EXTREMITIY--  STATSIS ULCER  . Hyperlipidemia   . Hypertension   . Hypothyroidism   . Iron deficiency anemia 2011   elevated MCV  . LBP (low back pain)    severe lumbar spondylosis s/p L3/L4,L4/L5 fusion 12/10  .  Lumbar spondylolysis   . OA (osteoarthritis)    "hands" (08/25/2013)  . Open wound of left lower leg   . Osteopenia   . Persistent atrial fibrillation ([place])    CARDIOLOGIST-   DR MCALANY  . RBBB    at fib  . Urinary frequency     PAST SURGICAL HISTORY: Past Surgical History:  Procedure Laterality Date  . APPLICATION OF A-CELL OF EXTREMITY Left 06/21/2013   Procedure: APPLICATION OF A-CELL OF EXTREMITY;  Surgeon: Theodoro Kos, DO;  Location: [place];  Service: Plastics;  Laterality: Left;  . APPLICATION OF A-CELL OF EXTREMITY Left 08/22/2013   Procedure: APPLICATION OF A-CELL/VAC TO LOWER LEFT LEG WOUND;  Surgeon: Theodoro Kos, DO;  Location: [place];  Service: Plastics;  Laterality: Left;  . APPLICATION OF A-CELL OF EXTREMITY Left 02/06/2014   Procedure: WITH PLACEMENT OF A -CELL ;  Surgeon: Theodoro Kos, DO;  Location: [place];  Service: Plastics;  Laterality: Left;  . APPLICATION OF A-CELL OF EXTREMITY Left 08/09/2014   Procedure: PLACEMENT OF A CELL;  Surgeon: Theodoro Kos, DO;  Location: [place];  Service: Plastics;  Laterality: Left;  . APPLICATION OF WOUND VAC Left 06/16/2013   Procedure: APPLICATION OF WOUND VAC;  Surgeon: Meredith Pel, MD;  Location: [employer];  Service: Orthopedics;  Laterality: Left;  . APPLICATION OF WOUND VAC Left 06/21/2013   Procedure: APPLICATION OF WOUND VAC;  Surgeon: Theodoro Kos, DO;  Location: [place];  Service: Plastics;  Laterality: Left;  . BIOPSY BREAST  07/25/2015   LEFT RADIOACTIVE SEED GUIDED EXCISIONAL BREAST BIOPSY (Left) as a surgical intervention  . EXTERNAL FIXATION LEG Left 06/16/2013   Procedure: EXTERNAL FIXATION LEG;  Surgeon: Meredith Pel, MD;  Location: [place];  Service: Orthopedics;  Laterality: Left;  . EXTERNAL FIXATION REMOVAL Left 06/21/2013   Procedure: REMOVAL EXTERNAL FIXATION LEG;  Surgeon: Meredith Pel, MD;  Location: [place];  Service: Orthopedics;  Laterality: Left;  . EYE SURGERY  Bilateral    cataract removal  . HARDWARE REMOVAL Left 05/23/2014   Procedure: HARDWARE REMOVAL;  Surgeon: Meredith Pel, MD;  Location: [place];  Service: Orthopedics;  Laterality: Left;  REMOVAL OF HARDWARE LEFT TIBIA  . I&D EXTREMITY Left 06/16/2013   Procedure: IRRIGATION AND DEBRIDEMENT EXTREMITY;  Surgeon: Meredith Pel, MD;  Location: [place];  Service: Orthopedics;  Laterality: Left;  . I&D EXTREMITY Left 02/06/2014   Procedure: IRRIGATION AND DEBRIDEMENT LEFT LEG WOUND ;  Surgeon: Theodoro Kos, DO;  Location: [place];  Service: Plastics;  Laterality: Left;  . I&D EXTREMITY Left 08/09/2014   Procedure: IRRIGATION AND DEBRIDEMENT  OF LEFT LOWER LEG;  Surgeon: Theodoro Kos, DO;  Location: [place];  Service: Plastics;  Laterality: Left;  . LUMBAR FUSION  01-02-2009   L3-L5  . RADIOACTIVE SEED GUIDED EXCISIONAL BREAST BIOPSY Left 07/25/2015   Procedure: LEFT RADIOACTIVE SEED GUIDED EXCISIONAL BREAST BIOPSY;  Surgeon: Rolm Bookbinder, MD;  Location: [place];  Service: General;  Laterality: Left;  . RE-EXCISION OF BREAST LUMPECTOMY Left 09/05/2015   Procedure: RE-EXCISION OF LEFT BREAST LUMPECTOMY;  Surgeon: Rolm Bookbinder, MD;  Location: [place];  Service: General;  Laterality: Left;  . SHOULDER OPEN ROTATOR CUFF REPAIR Left 1997  . TIBIA IM NAIL INSERTION Left 06/21/2013   Procedure: INTRAMEDULLARY (IM) NAIL TIBIAL;  Surgeon: Meredith Pel, MD;  Location: [place];  Service: Orthopedics;  Laterality: Left;  . TONSILLECTOMY  AS CHILD  . TOTAL HIP ARTHROPLASTY Right 06-07-2010  . TRANSTHORACIC ECHOCARDIOGRAM  09-11-2010   DR MCALHANY   LVSF  55-60%/  MILD MV CALCIFICATION WITH NO STENOSIS/  MILD MR & TR / MODERATE LAE/  MODERATE TO SEVERE RAE  . UPPER GI ENDOSCOPY  03-29-2010    FAMILY HISTORY Family History  Problem Relation Age of Onset  . Cancer Father   . Hypertension Other   The patient's father died at the age of 80 from heart problems. The patient's mother  died at the age of 30 with a ruptured aneurysm. The patient had no brothers, 1 sister. There is no history of breast or ovarian cancer in the family.   GYNECOLOGIC HISTORY:  No LMP recorded. Patient is postmenopausal. Menarche age 45, first live birth age 41. The patient is GX P2. She stopped having periods in her early 49s. She did not take hormone replacement. She used oral contraceptives remotely for approximately 2 years, with no complications.  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 Her husband [name] was a Airline pilot. They currently live at [facility]. Daughter [name] lives in [place], [place]. With her husband [name]. Daughter [name] lives in [place]. The patient has 2 grandchildren and 4 great-grandchildren. She is a Psychologist, forensic.    ADVANCED DIRECTIVES: The patient has a living will in place and she has named her granddaughter [name] as her healthcare part of attorney. [name] can be reached at [place]: Social History  Substance Use Topics  . Smoking status: Never Smoker  . Smokeless tobacco: Never Used  . Alcohol use No     Comment: 1 glass occ wine 08/03/14- none in a year     Colonoscopy: Laurence Spates, remote  PAP:  Bone density:   Allergies  Allergen Reactions  . Zosyn [Piperacillin Sod-Tazobactam So] Hives, Rash and Other (See Comments)    Morbilliform eruption with itching  . Atenolol Nausea And Vomiting  . Clarithromycin Nausea And Vomiting  . Codeine Sulfate Nausea Only  . Levaquin [Levofloxacin] Nausea Only  . Macrodantin Other (See Comments)    UNSPECIFIED   . Oxycodone-Acetaminophen Nausea And Vomiting  . Percocet [Oxycodone-Acetaminophen] Nausea And Vomiting    Current Outpatient Prescriptions  Medication Sig Dispense Refill  . acetaminophen (TYLENOL) 500 MG tablet Take 500 mg by mouth daily as needed for mild pain.     [name] anastrozole (ARIMIDEX) 1 MG tablet Take 1 tablet (1  mg total) by mouth daily. 90 tablet 3  . atorvastatin (LIPITOR) 10 MG tablet Take 1 tablet (10 mg total) by mouth every evening. 90 tablet 3  . B Complex-C (B-COMPLEX WITH VITAMIN C) tablet Take 1 tablet by mouth daily.    . busPIRone (BUSPAR) 7.5 MG tablet Take 1 tablet (7.5 mg total) by mouth 2 (two) times daily. 180 tablet 3  . Cholecalciferol (VITAMIN D3) 1000 UNITS tablet Take 1,000 Units by mouth daily.      [name] diltiazem (CARDIZEM CD) 120 MG 24 hr capsule TAKE 1 CAPSULE (120 MG TOTAL) BY MOUTH DAILY. 90 capsule 3  . doxycycline (  VIBRAMYCIN) 100 MG capsule Take 1 capsule (100 mg total) by mouth 2 (two) times daily. 60 capsule 5  . Ferrous Sulfate (IRON) 325 (65 FE) MG TABS Take 1 tablet by mouth daily.    . furosemide (LASIX) 40 MG tablet Take 1 tablet (40 mg total) by mouth as directed. 1 po on Mon-Wed-Fri in am 90 tablet 3  . KLOR-CON M10 10 MEQ tablet Take 1 tablet (10 mEq total) by mouth daily. 90 tablet 3  . levothyroxine (SYNTHROID, LEVOTHROID) 25 MCG tablet TAKE 1 TABLET BY MOUTH ONCE DAILY FOR THYROID 90 tablet 3  . metoprolol succinate (TOPROL-XL) 50 MG 24 hr tablet Take 1 tablet (50 mg total) by mouth 2 (two) times daily. Take with or immediately following a meal. 180 tablet 3  . Multiple Vitamins-Minerals (MULTIVITAMIN WITH MINERALS) tablet Take 1 tablet by mouth daily.    [name] omeprazole (PRILOSEC) 20 MG capsule Take 20 mg by mouth daily as needed.    [name] oxybutynin (DITROPAN) 5 MG tablet Take 1 tablet (5 mg total) by mouth 2 (two) times daily. 180 tablet 3  . polyethylene glycol (MIRALAX / GLYCOLAX) packet Take 17 g by mouth daily as needed for moderate constipation.     . temazepam (RESTORIL) 30 MG capsule Take 1 capsule (30 mg total) by mouth at bedtime. 30 capsule 3  . traMADol (ULTRAM) 50 MG tablet Take 1-2 tablets (50-100 mg total) by mouth every 6 (six) hours as needed. 10 tablet 0  . vitamin C (ASCORBIC ACID) 500 MG tablet Take 500 mg by mouth daily.    [name] warfarin (COUMADIN) 5  MG tablet TAKE AS DIRECTED BY THE COUMADIN CLINIC 30 tablet 3   No current facility-administered medications for this visit.     OBJECTIVE: Elderly white woman Who appears stated age 34:   10/22/15 1328  BP: (!) 141/75  Pulse: 79  Resp: 18  Temp: 97.4 F (36.3 C)     Body mass index is 31.92 kg/m.    ECOG FS:2 - Symptomatic, <50% confined to bed  Sclerae unicteric, pupils round and equal Oropharynx clear and moist-- no thrush or other lesions No cervical or supraclavicular adenopathy Lungs no rales or rhonchi Heart regular rate and rhythm Abd soft, nontender, positive bowel sounds MSK no focal spinal tenderness, no upper extremity lymphedema Neuro: nonfocal, well oriented, appropriate affect Breasts: Deferred    LAB RESULTS:  CMP     Component Value Date/Time   NA 137 08/30/2015 1113   NA 138 08/10/2015 1507   K 3.9 08/30/2015 1113   K 3.7 08/10/2015 1507   CL 102 08/30/2015 1113   CO2 26 08/30/2015 1113   CO2 25 08/10/2015 1507   GLUCOSE 98 08/30/2015 1113   GLUCOSE 121 08/10/2015 1507   BUN 13 08/30/2015 1113   BUN 19.9 08/10/2015 1507   CREATININE 0.78 08/30/2015 1113   CREATININE 1.1 08/10/2015 1507   CALCIUM 9.2 08/30/2015 1113   CALCIUM 9.4 08/10/2015 1507   PROT 7.8 08/10/2015 1507   ALBUMIN 3.9 08/10/2015 1507   AST 23 08/10/2015 1507   ALT 21 08/10/2015 1507   ALKPHOS 79 08/10/2015 1507   BILITOT 0.86 08/10/2015 1507   GFRNONAA >60 08/30/2015 1113   GFRAA >60 08/30/2015 1113    INo results found for: SPEP, UPEP  Lab Results  Component Value Date   WBC 6.9 10/22/2015   NEUTROABS 4.3 10/22/2015   HGB 14.2 10/22/2015   HCT 43.8 10/22/2015   MCV 104.1 (H)  10/22/2015   PLT 214 10/22/2015      Chemistry      Component Value Date/Time   NA 137 08/30/2015 1113   NA 138 08/10/2015 1507   K 3.9 08/30/2015 1113   K 3.7 08/10/2015 1507   CL 102 08/30/2015 1113   CO2 26 08/30/2015 1113   CO2 25 08/10/2015 1507   BUN 13 08/30/2015 1113    BUN 19.9 08/10/2015 1507   CREATININE 0.78 08/30/2015 1113   CREATININE 1.1 08/10/2015 1507      Component Value Date/Time   CALCIUM 9.2 08/30/2015 1113   CALCIUM 9.4 08/10/2015 1507   ALKPHOS 79 08/10/2015 1507   AST 23 08/10/2015 1507   ALT 21 08/10/2015 1507   BILITOT 0.86 08/10/2015 1507       No results found for: LABCA2  No components found for: LABCA125  No results for input(s): INR in the last 168 hours.  Urinalysis    Component Value Date/Time   COLORURINE YELLOW 02/25/2015 0305   APPEARANCEUR CLEAR 02/25/2015 0305   LABSPEC 1.008 02/25/2015 0305   PHURINE 7.5 02/25/2015 0305   GLUCOSEU NEGATIVE 02/25/2015 0305   GLUCOSEU NEGATIVE 01/17/2015 1415   HGBUR NEGATIVE 02/25/2015 0305   BILIRUBINUR NEGATIVE 02/25/2015 0305   KETONESUR NEGATIVE 02/25/2015 0305   PROTEINUR NEGATIVE 02/25/2015 0305   UROBILINOGEN 0.2 01/17/2015 1415   NITRITE NEGATIVE 02/25/2015 0305   LEUKOCYTESUR TRACE (A) 02/25/2015 0305     STUDIES: No results found.  ELIGIBLE FOR AVAILABLE RESEARCH PROTOCOL: no  ASSESSMENT: 82 y.o. [place] woman status post left breast upper outer quadrant biopsy 06/11/2015 for a papillary lesion with unclear margins.  (1) status post left lumpectomy 07/25/2015 for an mpT2 cN0, stage IIA  invasive ductal carcinoma,  grade I-II  estrogen and progesterone receptor strongly positive, HER-2 not amplified   (a) additional surgery 09/05/2015 found some residual ductal carcinoma in situ but the margins were clear.  (2) No sentinel lymph node sampling or adjuvant radiation plan  (3) adjuvant anastrozole started 08/10/2015  PLAN: I reviewed with Destini the fact that the cancer found and her second surgery was not invasive and therefore could not take her life. If it were to recur it would recurrent the same spot. She has a low risk of recurrence and she is making it even lower by taking the anastrozole.  Of course the main reason for her taking anastrozole as the  invasive component which was previously excised.  The plan is to take anastrozole for total of 5 years. She is going to see me again in June of next year, after May mammography.   I am adding a bone density scan to her mammogram schedule at [facility] next year. We will also be checking her vitamin D level at the next visit.  She knows to call for any problems that may develop before that  Chauncey Cruel, MD   10/22/2015 2:06 PM Medical Oncology and Hematology [facility] [place], [place] [address] Tel. [phone]    Fax. [phone]

## 2015-10-23 ENCOUNTER — Other Ambulatory Visit: Payer: Self-pay | Admitting: *Deleted

## 2015-10-23 MED ORDER — KLOR-CON M10 10 MEQ PO TBCR: 10.0000 meq | EXTENDED_RELEASE_TABLET | Freq: Every day | ORAL | 2 refills | Status: DC

## 2015-10-23 NOTE — Telephone Encounter (Signed)
When calling refill inform pt husband charlotte stated she was just getting ready to call on Mrs. Capotosto. She is needing a refill on her Potassium 10 mg they are new to the facility need script. Gave verbal authorization for K.../lmb

## 2015-10-31 ENCOUNTER — Ambulatory Visit (INDEPENDENT_AMBULATORY_CARE_PROVIDER_SITE_OTHER): Payer: Medicare Other | Admitting: Pharmacist

## 2015-10-31 DIAGNOSIS — I4892 Unspecified atrial flutter: Secondary | ICD-10-CM | POA: Diagnosis not present

## 2015-10-31 DIAGNOSIS — Z5181 Encounter for therapeutic drug level monitoring: Secondary | ICD-10-CM

## 2015-10-31 LAB — POCT INR: INR: 2.4

## 2015-11-13 ENCOUNTER — Ambulatory Visit (INDEPENDENT_AMBULATORY_CARE_PROVIDER_SITE_OTHER): Payer: Medicare Other | Admitting: Internal Medicine

## 2015-11-13 ENCOUNTER — Encounter: Payer: Self-pay | Admitting: Internal Medicine

## 2015-11-13 DIAGNOSIS — N63 Unspecified lump in unspecified breast: Secondary | ICD-10-CM

## 2015-11-13 DIAGNOSIS — I10 Essential (primary) hypertension: Secondary | ICD-10-CM

## 2015-11-13 DIAGNOSIS — Z7901 Long term (current) use of anticoagulants: Secondary | ICD-10-CM

## 2015-11-13 DIAGNOSIS — C50412 Malignant neoplasm of upper-outer quadrant of left female breast: Secondary | ICD-10-CM

## 2015-11-13 DIAGNOSIS — N259 Disorder resulting from impaired renal tubular function, unspecified: Secondary | ICD-10-CM

## 2015-11-13 DIAGNOSIS — E039 Hypothyroidism, unspecified: Secondary | ICD-10-CM | POA: Diagnosis not present

## 2015-11-13 DIAGNOSIS — I484 Atypical atrial flutter: Secondary | ICD-10-CM

## 2015-11-13 DIAGNOSIS — Z170 Estrogen receptor positive status [ER+]: Secondary | ICD-10-CM

## 2015-11-13 NOTE — Assessment & Plan Note (Signed)
On Coumadin 

## 2015-11-13 NOTE — Assessment & Plan Note (Signed)
on Arimidex

## 2015-11-13 NOTE — Assessment & Plan Note (Signed)
On Levothroid 

## 2015-11-13 NOTE — Assessment & Plan Note (Signed)
Metoprolol, Diltiazem On Coumadin

## 2015-11-13 NOTE — Progress Notes (Signed)
Pre visit review using our clinic review tool, if applicable. No additional management support is needed unless otherwise documented below in the visit note. 

## 2015-11-13 NOTE — Assessment & Plan Note (Signed)
Labs

## 2015-11-13 NOTE — Progress Notes (Signed)
Subjective:  Patient ID: [name], female    DOB: 1925/04/10  Age: 82 y.o. MRN: [id number]  CC: No chief complaint on file.   HPI [name] presents for HTN, dyslipidemia, HTN, hypothyroidism f/u  Outpatient Medications Prior to Visit  Medication Sig Dispense Refill  . acetaminophen (TYLENOL) 500 MG tablet Take 500 mg by mouth daily as needed for mild pain.     [name] anastrozole (ARIMIDEX) 1 MG tablet Take 1 tablet (1 mg total) by mouth daily. 90 tablet 3  . atorvastatin (LIPITOR) 10 MG tablet Take 1 tablet (10 mg total) by mouth every evening. 90 tablet 3  . B Complex-C (B-COMPLEX WITH VITAMIN C) tablet Take 1 tablet by mouth daily.    . busPIRone (BUSPAR) 7.5 MG tablet Take 1 tablet (7.5 mg total) by mouth 2 (two) times daily. 180 tablet 3  . Cholecalciferol (VITAMIN D3) 1000 UNITS tablet Take 1,000 Units by mouth daily.      [name] diltiazem (CARDIZEM CD) 120 MG 24 hr capsule TAKE 1 CAPSULE (120 MG TOTAL) BY MOUTH DAILY. 90 capsule 3  . doxycycline (VIBRAMYCIN) 100 MG capsule Take 1 capsule (100 mg total) by mouth 2 (two) times daily. 60 capsule 5  . Ferrous Sulfate (IRON) 325 (65 FE) MG TABS Take 1 tablet by mouth daily.    . furosemide (LASIX) 40 MG tablet Take 1 tablet (40 mg total) by mouth as directed. 1 po on Mon-Wed-Fri in am 90 tablet 3  . KLOR-CON M10 10 MEQ tablet Take 1 tablet (10 mEq total) by mouth daily. 90 tablet 2  . levothyroxine (SYNTHROID, LEVOTHROID) 25 MCG tablet TAKE 1 TABLET BY MOUTH ONCE DAILY FOR THYROID 90 tablet 3  . metoprolol succinate (TOPROL-XL) 50 MG 24 hr tablet Take 1 tablet (50 mg total) by mouth 2 (two) times daily. Take with or immediately following a meal. 180 tablet 3  . Multiple Vitamins-Minerals (MULTIVITAMIN WITH MINERALS) tablet Take 1 tablet by mouth daily.    [name] omeprazole (PRILOSEC) 20 MG capsule Take 20 mg by mouth daily as needed.    [name] oxybutynin (DITROPAN) 5 MG tablet Take 1 tablet (5 mg total) by mouth 2 (two) times daily. 180 tablet 3    . polyethylene glycol (MIRALAX / GLYCOLAX) packet Take 17 g by mouth daily as needed for moderate constipation.     . temazepam (RESTORIL) 30 MG capsule Take 1 capsule (30 mg total) by mouth at bedtime. 30 capsule 3  . traMADol (ULTRAM) 50 MG tablet Take 1-2 tablets (50-100 mg total) by mouth every 6 (six) hours as needed. 10 tablet 0  . vitamin C (ASCORBIC ACID) 500 MG tablet Take 500 mg by mouth daily.    [name] warfarin (COUMADIN) 5 MG tablet TAKE AS DIRECTED BY THE COUMADIN CLINIC 30 tablet 3   No facility-administered medications prior to visit.     ROS Review of Systems  Constitutional: Positive for fatigue. Negative for activity change, appetite change, chills and unexpected weight change.  HENT: Negative for congestion, mouth sores and sinus pressure.   Eyes: Negative for visual disturbance.  Respiratory: Negative for cough and chest tightness.   Gastrointestinal: Negative for abdominal pain and nausea.  Genitourinary: Negative for difficulty urinating, frequency and vaginal pain.  Musculoskeletal: Positive for back pain and gait problem.  Skin: Negative for pallor and rash.  Neurological: Negative for dizziness, tremors, weakness, numbness and headaches.  Psychiatric/Behavioral: Negative for confusion and sleep disturbance.    Objective:  BP  110/60   Pulse 68   Wt 182 lb (82.6 kg)   SpO2 96%   BMI 32.24 kg/m   BP Readings from Last 3 Encounters:  11/13/15 110/60  10/22/15 (!) 141/75  10/12/15 120/80    Wt Readings from Last 3 Encounters:  11/13/15 182 lb (82.6 kg)  10/22/15 180 lb 3.2 oz (81.7 kg)  10/12/15 182 lb 12.8 oz (82.9 kg)    Physical Exam  Constitutional: She appears well-developed. No distress.  HENT:  Head: Normocephalic.  Right Ear: External ear normal.  Left Ear: External ear normal.  Nose: Nose normal.  Mouth/Throat: Oropharynx is clear and moist.  Eyes: Conjunctivae are normal. Pupils are equal, round, and reactive to light. Right eye exhibits  no discharge. Left eye exhibits no discharge.  Neck: Normal range of motion. Neck supple. No JVD present. No tracheal deviation present. No thyromegaly present.  Cardiovascular: Normal rate, regular rhythm and normal heart sounds.   Pulmonary/Chest: No stridor. No respiratory distress. She has no wheezes.  Abdominal: Soft. Bowel sounds are normal. She exhibits no distension and no mass. There is no tenderness. There is no rebound and no guarding.  Musculoskeletal: She exhibits tenderness. She exhibits no edema.  Lymphadenopathy:    She has no cervical adenopathy.  Neurological: She displays normal reflexes. No cranial nerve deficit. She exhibits normal muscle tone. Coordination abnormal.  Skin: No rash noted. No erythema.  Psychiatric: She has a normal mood and affect. Her behavior is normal. Judgment and thought content normal.    Lab Results  Component Value Date   WBC 6.9 10/22/2015   HGB 14.2 10/22/2015   HCT 43.8 10/22/2015   PLT 214 10/22/2015   GLUCOSE 131 10/22/2015   CHOL 116 06/25/2012   TRIG 82 09/05/2013   HDL 41.50 06/25/2012   LDLDIRECT 110.3 05/24/2008   LDLCALC 66 06/25/2012   ALT 20 10/22/2015   AST 21 10/22/2015   NA 138 10/22/2015   K 3.5 10/22/2015   CL 102 08/30/2015   CREATININE 0.9 10/22/2015   BUN 13.4 10/22/2015   CO2 26 10/22/2015   TSH 0.51 01/17/2014   INR 2.4 10/31/2015   HGBA1C 6.1 (H) 09/08/2013    No results found.  Assessment & Plan:   There are no diagnoses linked to this encounter. I am having Ms. Cancel maintain her cholecalciferol, B-complex with vitamin C, Iron, acetaminophen, polyethylene glycol, vitamin C, atorvastatin, busPIRone, furosemide, levothyroxine, temazepam, omeprazole, multivitamin with minerals, traMADol, diltiazem, metoprolol succinate, oxybutynin, warfarin, doxycycline, anastrozole, and KLOR-CON M10.  No orders of the defined types were placed in this encounter.    Follow-up: No Follow-up on file.  Walker Kehr, MD

## 2015-11-28 IMAGING — CR DG CHEST 1V PORT
1 series · 1 of 1 positions shown · non-contrast
Comparison: Portable exam at 3163 hrs compared to 06/15/2012

CLINICAL DATA: Leg pain post fall

EXAM:
PORTABLE CHEST - 1 VIEW

[AP]
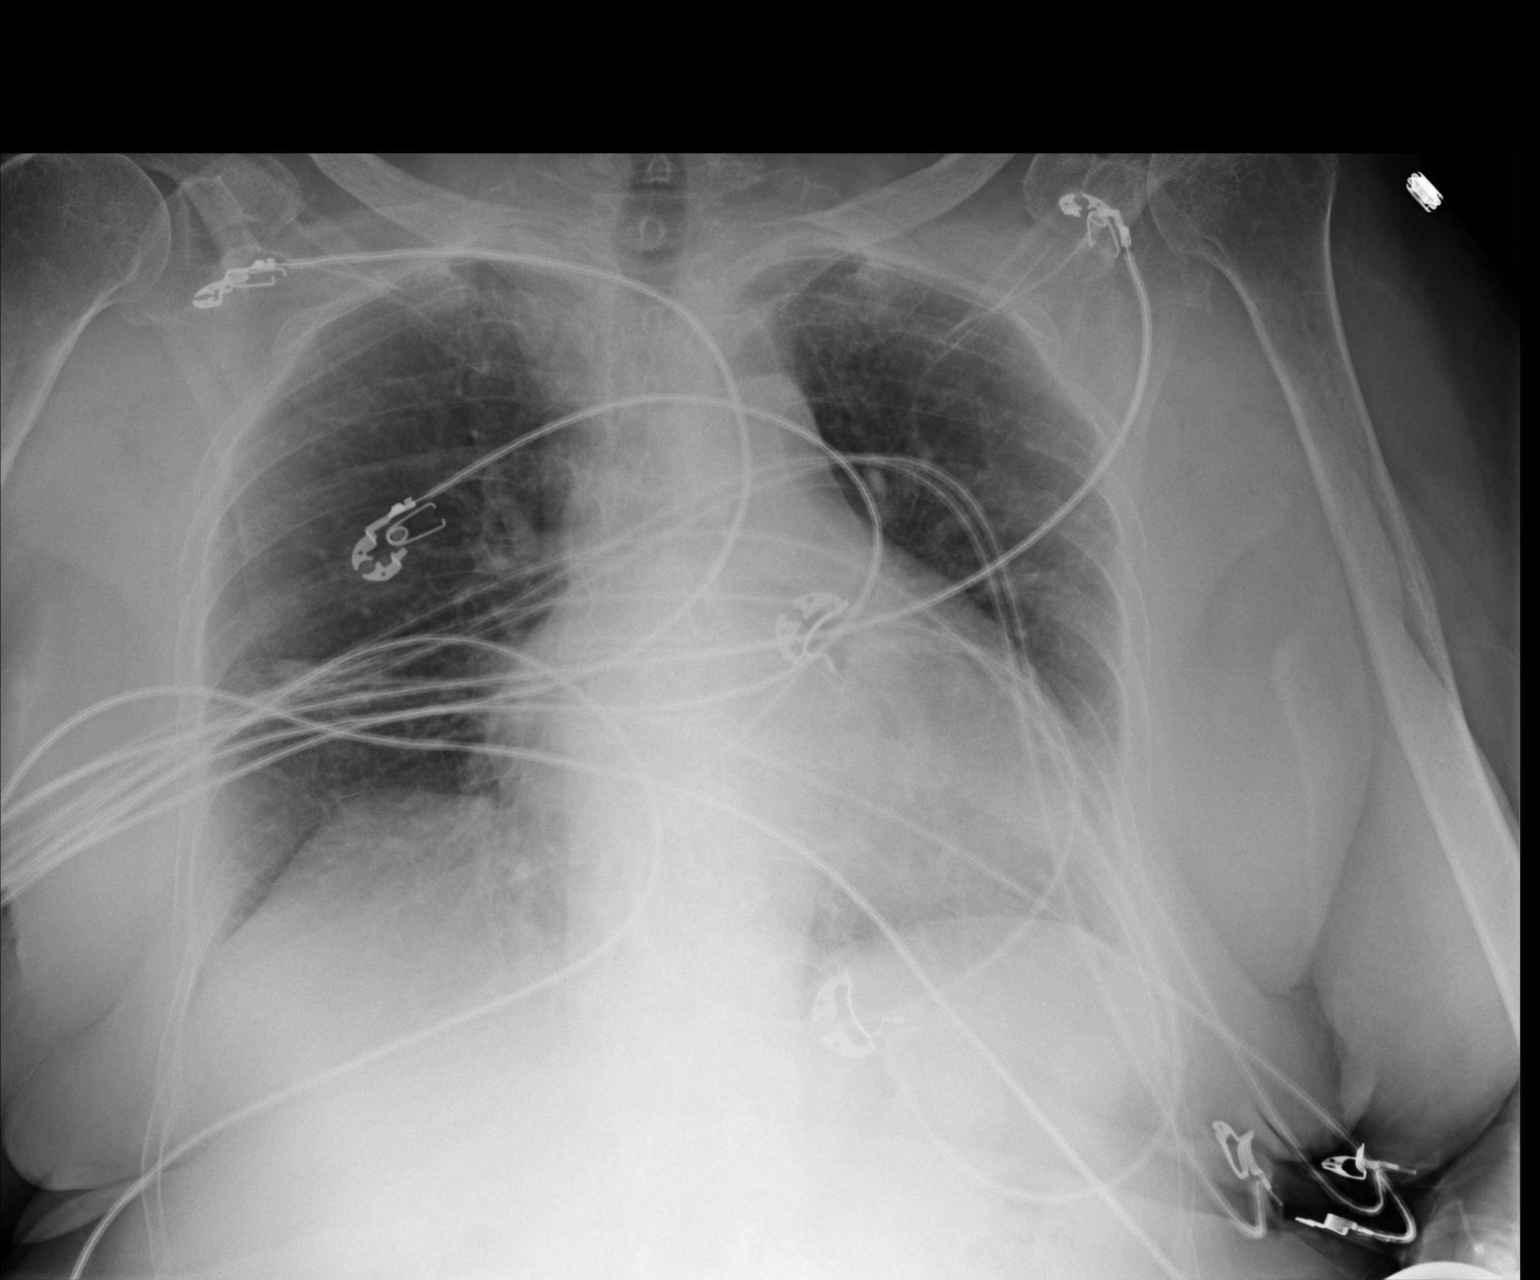

[1 of 1 positions shown; findings below may reference images not displayed]

FINDINGS: Enlargement of cardiac silhouette.

Mediastinal contours and pulmonary vascularity normal.

Numerous EKG leads project over chest particularly RIGHT middle
lobe.

Chronic subsegmental atelectasis versus scarring in RIGHT middle
lobe, superimposed with EKG leads, better visualized on previous
exam.

No definite acute infiltrate, pleural effusion or pneumothorax.

Bones demineralized.
IMPRESSION: Enlargement of cardiac silhouette.

Chronic subsegmental atelectasis versus scarring in RIGHT middle
lobe.

## 2015-11-29 IMAGING — CR DG ANKLE PORT 2V*L*
2 series · 2 of 2 positions shown · non-contrast
Comparison: Intraoperative images 06/17/2013

CLINICAL DATA: LEFT ankle fracture post external fixation

EXAM:
PORTABLE LEFT ANKLE - 2 VIEW

[AP]
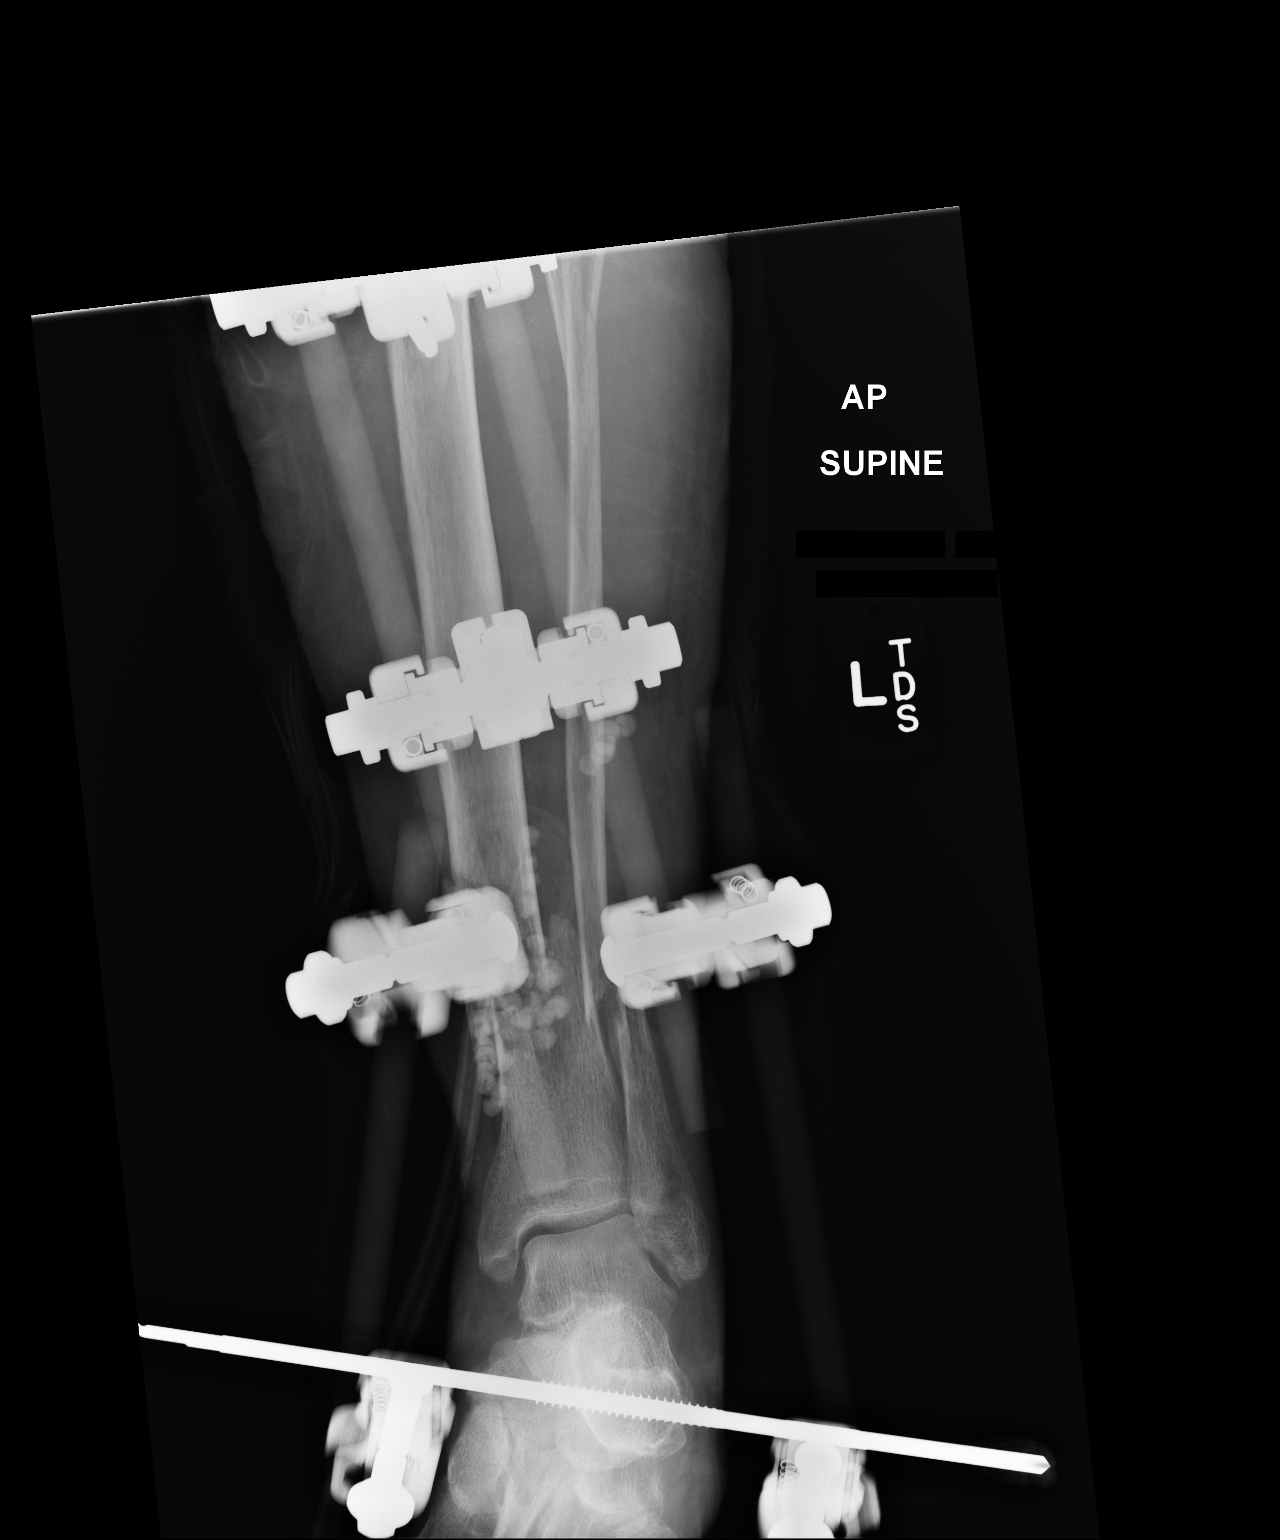

[lateral]
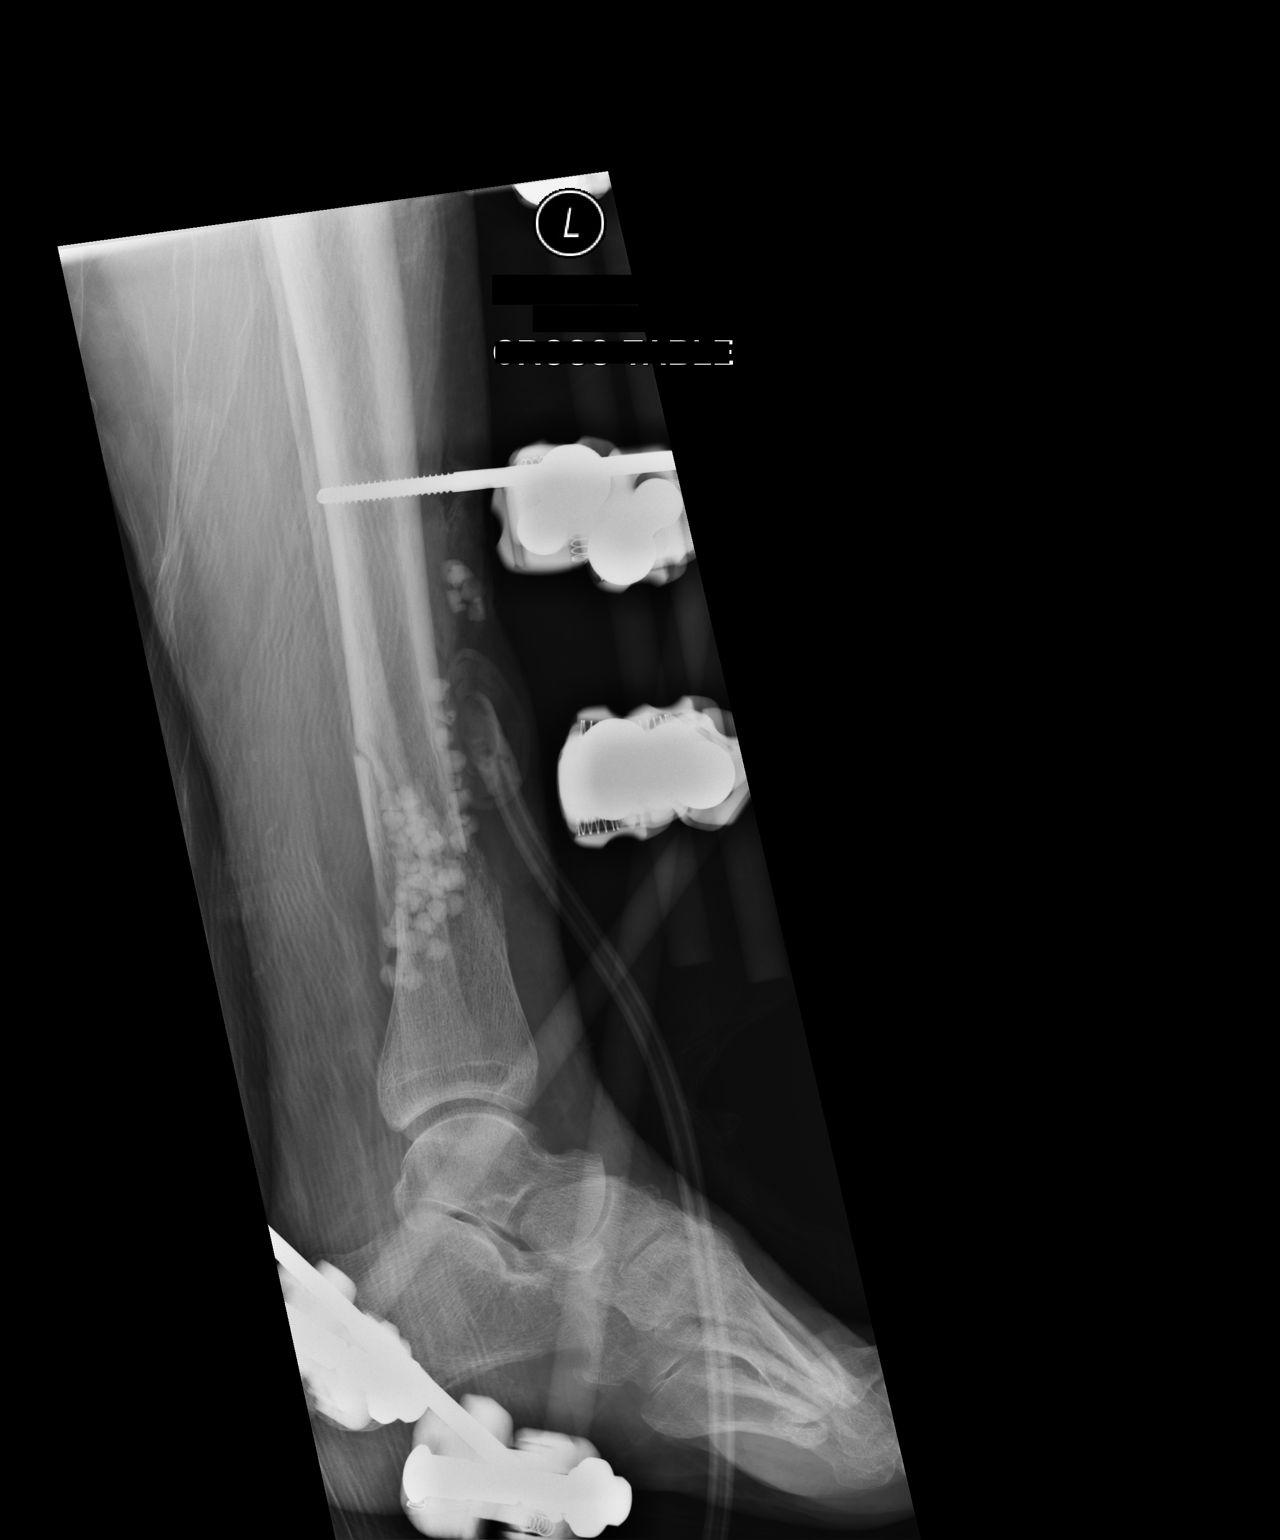

[2 of 2 positions shown; findings below may reference images not displayed]

FINDINGS: External fixation device has been applied.

Distal RIGHT tibial fracture identified displaced medially and
slightly anteriorly.

Displaced fibular diaphyseal fracture identified displaced medially
and slightly anteriorly.

Ankle joint alignment appears normal.

Bones appear slightly demineralized.

Wound VAC and antibiotic beads noted.
IMPRESSION: Postoperative changes from external fixation as above.

## 2015-12-02 IMAGING — CT CT ANKLE*L* W/O CM
2 of 3 series · 11 of 33 positions shown, 13 images · non-contrast
Comparison: Radiography from 2 days prior

CLINICAL DATA: Preop.  Evaluate distal fracture fragments.

EXAM:
CT OF THE LEFT ANKLE WITHOUT CONTRAST
TECHNIQUE: Multidetector CT imaging was performed according to the standard
protocol. Multiplanar CT image reconstructions were also generated.

[Series 3: lfov ext 3.0 b40s · axial · 0.45mm/px · z∈[+428,+756]mm · 8 of 129 slices shown, 10 images]
[im 10/129  soft-tissue]
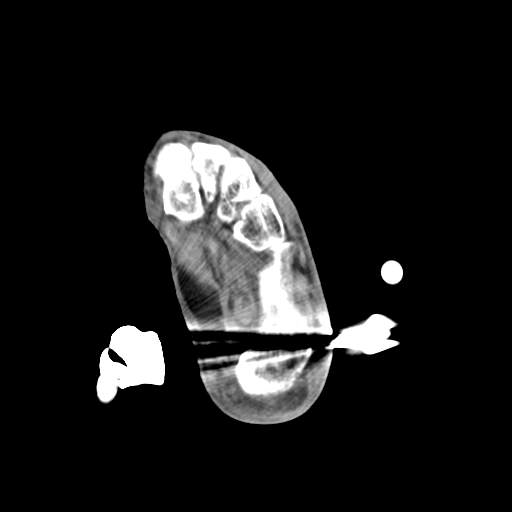
[im 10/129  bone]
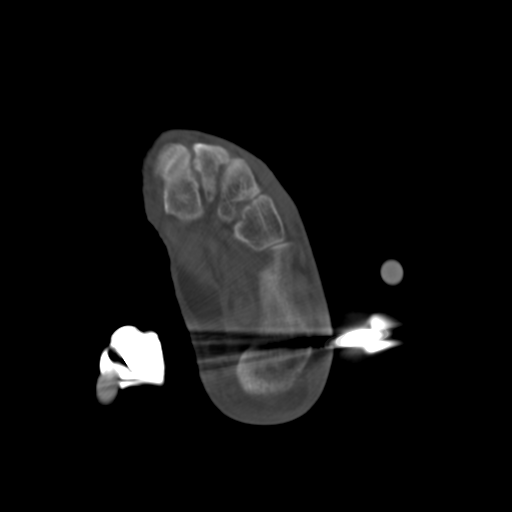
[im 30/129  bone]
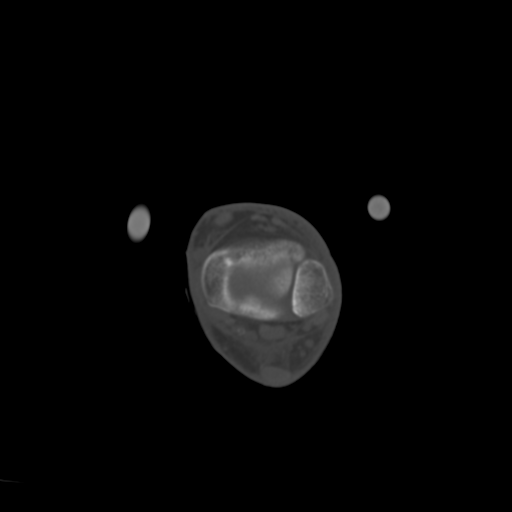
[im 40/129  bone]
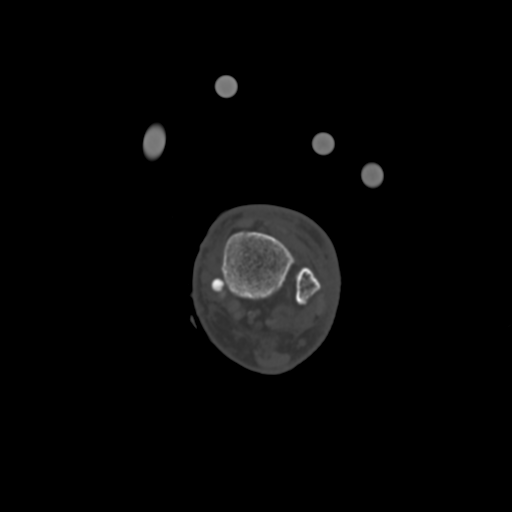
[im 60/129  bone]
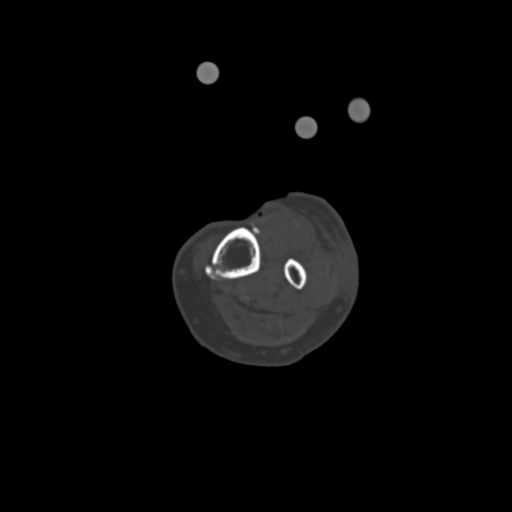
[im 69/129  soft-tissue]
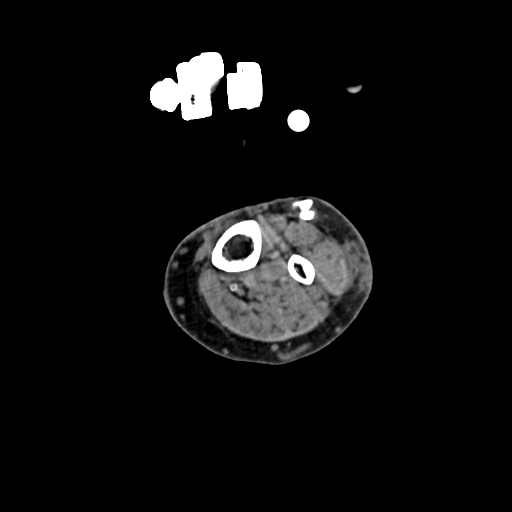
[im 69/129  bone]
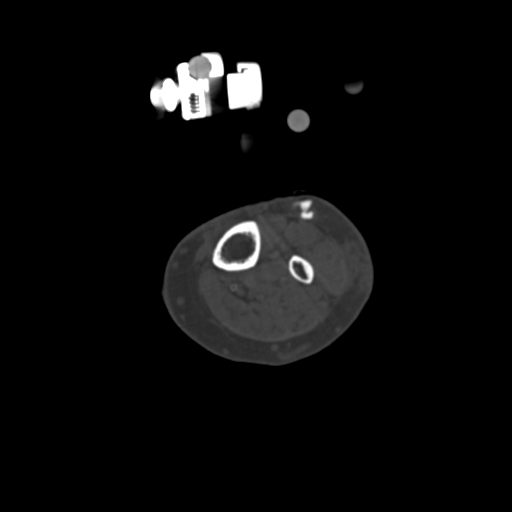
[im 89/129  bone]
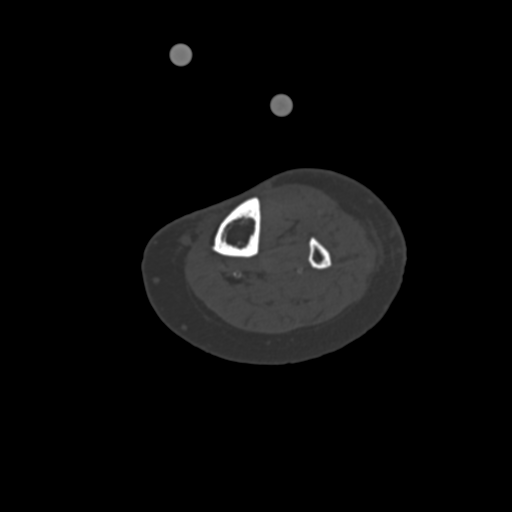
[im 99/129  bone]
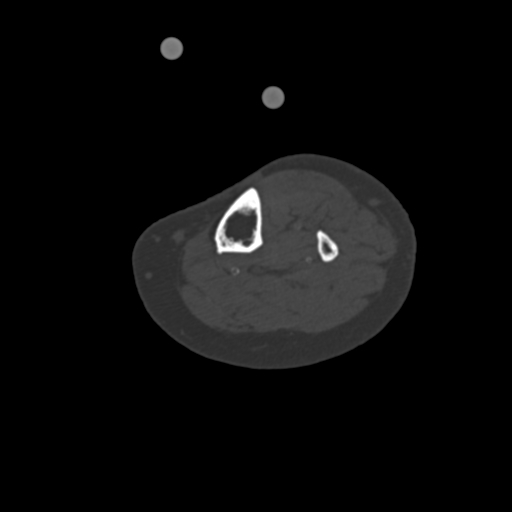
[im 119/129  bone]
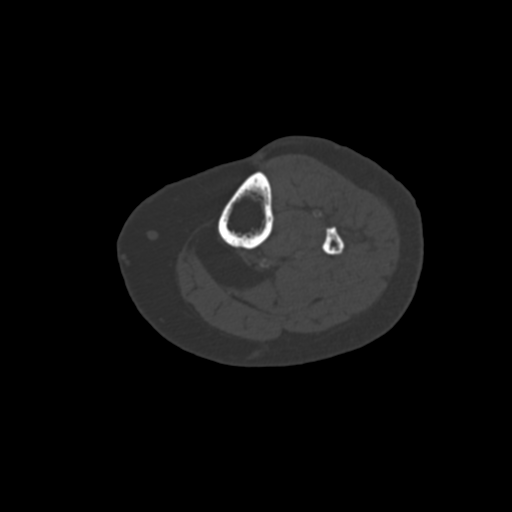

[Series 8: coronalsoft tissue · coronal · 0.26mm/px · 3 of 53 slices shown]
[im 11/53  bone]
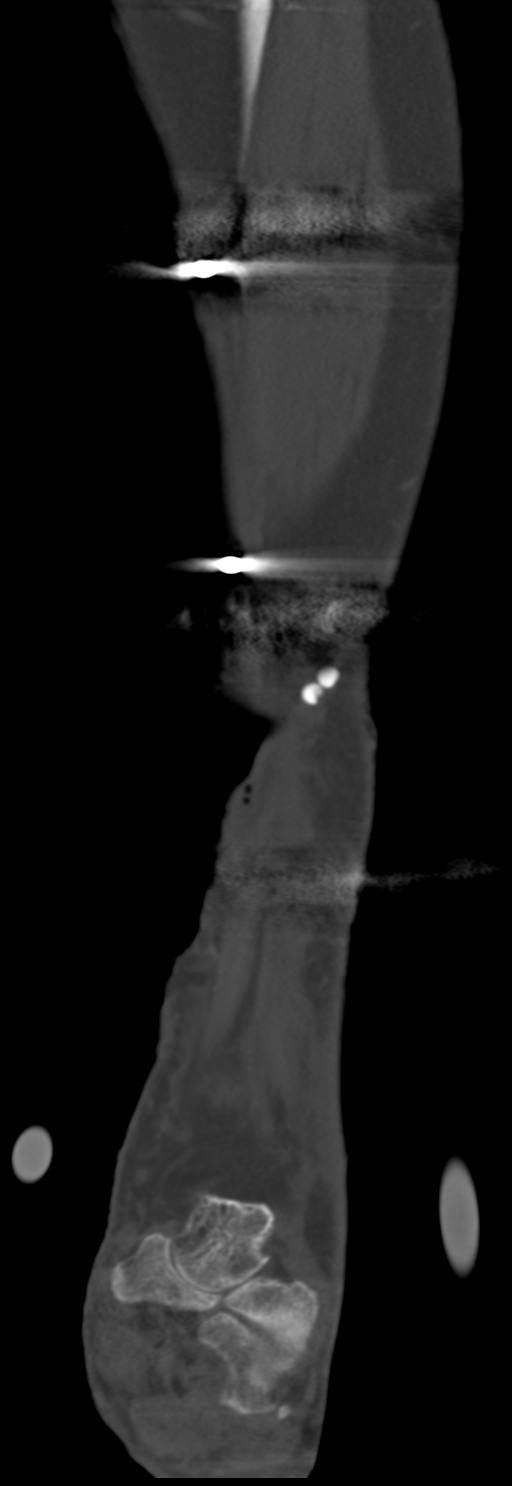
[im 21/53  bone]
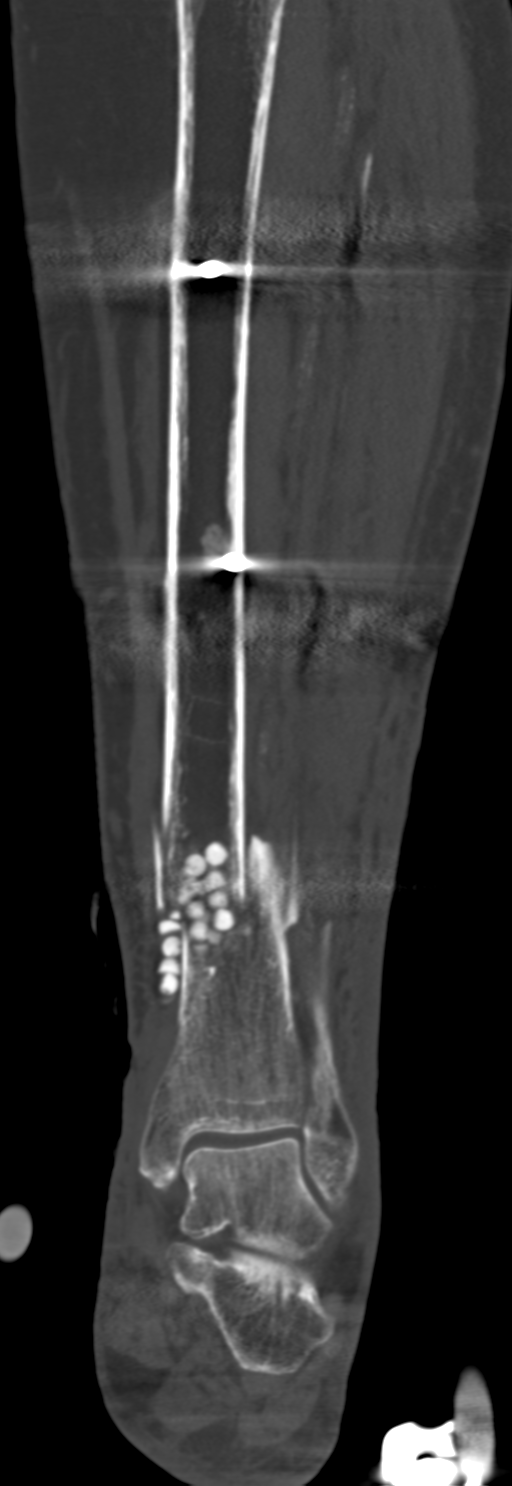
[im 32/53  bone]
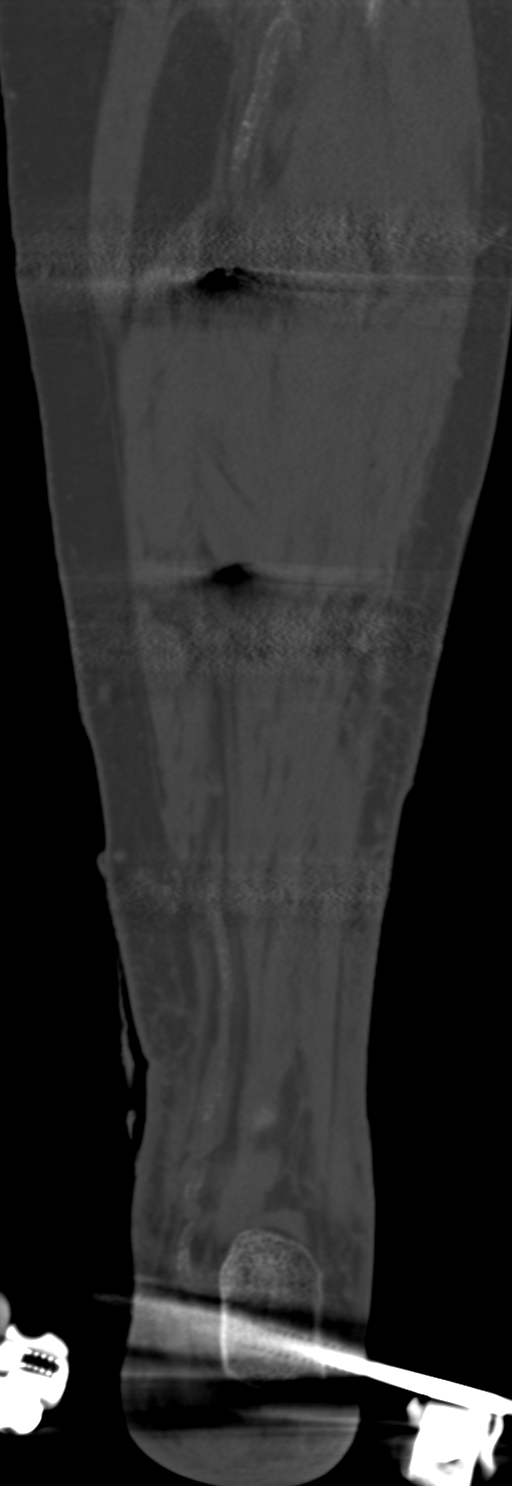

[11 of 33 positions shown; findings below may reference images not displayed]

FINDINGS: Recent open fracture of the distal leg. Antibiotic beads are present
within the tibial fracture and within the anterior soft tissue
incision/defect. The tibial fracture is through the distal
diaphysis. The largest comminuted fragment is posterior and medial,
measuring 4.5 cm in length. The main fracture plane, which is
transverse, is displaced 25% laterally. No significant rotation.
There is a separate fractures through the lateral aspect of the
distal tibial epiphysis, extending to the joint, without offset.

Fibular fracture is segmental, with 20 cm between fractures.
Comminution at the level of the proximal diaphysis and distal
diaphysis fractures.

No hindfoot or midfoot fracture identified.

External fixation has been applied into the calcaneus and tibia.
There is no fracture around the pins.

No evidence of fluid collection to suggest abscess. No gross tendon
rupture or displacement. Arterial calcification. Osteopenia.
IMPRESSION: 1. Comminuted distal tibial diaphysis fracture with displacement
described above.
2. Segmental fibular diaphysis fracture with comminution.
3. Tibial avulsion fracture at the anterior tibiofibular ligament
attachment. No widening of the ankle mortise.

## 2015-12-11 ENCOUNTER — Ambulatory Visit (INDEPENDENT_AMBULATORY_CARE_PROVIDER_SITE_OTHER): Payer: Medicare Other | Admitting: *Deleted

## 2015-12-11 DIAGNOSIS — I4892 Unspecified atrial flutter: Secondary | ICD-10-CM | POA: Diagnosis not present

## 2015-12-11 DIAGNOSIS — Z5181 Encounter for therapeutic drug level monitoring: Secondary | ICD-10-CM | POA: Diagnosis not present

## 2015-12-11 LAB — POCT INR: INR: 2

## 2015-12-27 ENCOUNTER — Telehealth: Payer: Self-pay | Admitting: Cardiovascular Disease

## 2015-12-27 NOTE — Telephone Encounter (Signed)
Called patient and scheduled 6 month follow up appointment on 04/14/16. Patient verbalized understanding and was grateful for the call.

## 2015-12-27 NOTE — Telephone Encounter (Signed)
New message   Pt verbalized that she is calling for pat because she said she was suppose to schedule an early appt

## 2016-01-04 ENCOUNTER — Ambulatory Visit: Payer: Medicare Other | Admitting: Cardiovascular Disease

## 2016-01-22 ENCOUNTER — Ambulatory Visit (INDEPENDENT_AMBULATORY_CARE_PROVIDER_SITE_OTHER): Payer: Medicare Other | Admitting: *Deleted

## 2016-01-22 DIAGNOSIS — I4892 Unspecified atrial flutter: Secondary | ICD-10-CM | POA: Diagnosis not present

## 2016-01-22 DIAGNOSIS — Z5181 Encounter for therapeutic drug level monitoring: Secondary | ICD-10-CM | POA: Diagnosis not present

## 2016-01-22 LAB — POCT INR: INR: 2.3

## 2016-01-24 ENCOUNTER — Telehealth: Payer: Self-pay | Admitting: *Deleted

## 2016-01-24 NOTE — Telephone Encounter (Signed)
Left msg on triage stating pt is needing new rx faxed over to [facility] on her Temazepam. Fax  # [phone]...Johny Chess

## 2016-01-25 MED ORDER — TEMAZEPAM 30 MG PO CAPS: 30.0000 mg | ORAL_CAPSULE | Freq: Every day | ORAL | 5 refills | Status: DC

## 2016-01-25 NOTE — Telephone Encounter (Signed)
Printed script, MD signed faxed to [facility]...Johny Chess

## 2016-01-25 NOTE — Telephone Encounter (Signed)
OK to fill this/these prescription(s) with additional refills x5  Thank you!  

## 2016-01-28 ENCOUNTER — Other Ambulatory Visit: Payer: Self-pay | Admitting: *Deleted

## 2016-01-28 MED ORDER — BUSPIRONE HCL 7.5 MG PO TABS: 7.5000 mg | ORAL_TABLET | Freq: Two times a day (BID) | ORAL | 1 refills | Status: DC

## 2016-01-28 MED ORDER — FUROSEMIDE 40 MG PO TABS: 40.0000 mg | ORAL_TABLET | ORAL | 1 refills | Status: DC

## 2016-01-28 MED ORDER — DOXYCYCLINE HYCLATE 100 MG PO CAPS: 100.0000 mg | ORAL_CAPSULE | Freq: Two times a day (BID) | ORAL | 5 refills | Status: DC

## 2016-01-28 MED ORDER — OXYBUTYNIN CHLORIDE 5 MG PO TABS: 5.0000 mg | ORAL_TABLET | Freq: Two times a day (BID) | ORAL | 1 refills | Status: DC

## 2016-01-28 MED ORDER — KLOR-CON M10 10 MEQ PO TBCR: 10.0000 meq | EXTENDED_RELEASE_TABLET | Freq: Every day | ORAL | 1 refills | Status: DC

## 2016-01-28 NOTE — Addendum Note (Signed)
Addended by: Earnstine Regal on: 01/28/2016 10:33 AM   Modules accepted: Orders

## 2016-02-11 ENCOUNTER — Encounter (INDEPENDENT_AMBULATORY_CARE_PROVIDER_SITE_OTHER): Payer: Self-pay | Admitting: Orthopedic Surgery

## 2016-02-11 ENCOUNTER — Ambulatory Visit (INDEPENDENT_AMBULATORY_CARE_PROVIDER_SITE_OTHER): Payer: Medicare Other | Admitting: Orthopedic Surgery

## 2016-02-11 ENCOUNTER — Ambulatory Visit (INDEPENDENT_AMBULATORY_CARE_PROVIDER_SITE_OTHER): Payer: Medicare Other

## 2016-02-11 DIAGNOSIS — M899 Disorder of bone, unspecified: Secondary | ICD-10-CM | POA: Diagnosis not present

## 2016-02-11 DIAGNOSIS — M949 Disorder of cartilage, unspecified: Secondary | ICD-10-CM

## 2016-02-11 NOTE — Progress Notes (Signed)
Office Visit Note   Patient: [name]           Date of Birth: 01/27/1925           MRN: [id number] Visit Date: 02/11/2016 Requested by: [name], MD [address] [place], [place] [address] PCP: Walker Kehr, MD  Subjective: Chief Complaint  Patient presents with  . Left Leg - Follow-up    HPI [name] is a 83 year old female 38 month recheck left leg chronic fistula.  She is on doxycycline twice a day.  In general she's doing well.  Minimal to no drainage from this left leg is reported.  She is able to ambulate.              Review of Systems All systems reviewed are negative as they relate to the chief complaint within the history of present illness.  Patient denies  fevers or chills.    Assessment & Plan: Visit Diagnoses:  1. Disorder of bone and cartilage     Plan: Impression is stable left leg fistula.  No evidence of induration and erythema or fluctuance.  Really minimal to no drainage today.  She's been stable now for a year.  I would continue with antibiotic suppression.  If she has further issues then we can address those at the time but for now she is ambulatory and has made a good recovery from what was a significantly traumatic injury.  Follow-up with me as needed  Follow-Up Instructions: No Follow-up on file.   Orders:  Orders Placed This Encounter  Procedures  . XR Tibia/Fibula Left   No orders of the defined types were placed in this encounter.     Procedures: No procedures performed   Clinical Data: No additional findings.  Objective: Vital Signs: There were no vitals taken for this visit.  Physical Exam   Constitutional: Patient appears well-developed HEENT:  Head: Normocephalic Eyes:EOM are normal Neck: Normal range of motion Cardiovascular: Normal rate Pulmonary/chest: Effort normal Neurologic: Patient is alert Skin: Skin is warm Psychiatric: Patient has normal mood and affect    Ortho Exam examination of the left leg  demonstrates mild swelling palpable pedal pulses well-healed surgical incisions no erythema and no fluctuance noted induration.  She has a slight pinhole area near the bone void in the distal tibia.  No drainage from this area is present.  Specialty Comments:  No specialty comments available.  Imaging: Xr Tibia/fibula Left  Result Date: 02/11/2016 2 views left tib-fib reviewed.  Patient has a circular bone deficiency in the distal tibia.  No evidence of bony destruction or moth-eaten appearance around this region.  Prior distal tib-fib fracture has healed.    PMFS History: Patient Active Problem List   Diagnosis Date Noted  . Breast cancer of upper-outer quadrant of left female breast ([facility]) 08/10/2015  . Breast mass in female 07/25/2015  . Left breast mass 07/05/2015  . CAP (community acquired pneumonia) 03/06/2015  . Incontinence in female 07/05/2014  . Edema 01/17/2014  . Morbilliform rash 09/06/2013  . Sepsis ([facility]) 08/25/2013  . Metabolic acidosis 99991111  . Diverticulitis of colon with perforation 08/25/2013  . Unspecified constipation 06/22/2013  . Open tibial fracture 06/17/2013  . Encounter for therapeutic drug monitoring 02/16/2013  . Well adult exam 07/21/2012  . Paresthesia 01/28/2012  . Chronic venous insufficiency 11/11/2010  . Actinic keratoses 05/30/2010  . Long term current use of anticoagulant 03/12/2010  . PEPTIC ULCER DISEASE 11/22/2009  . Iron deficiency anemia  10/31/2009  . HIP PAIN 10/31/2009  . Hypothyroidism 10/25/2009  . Disorder resulting from impaired renal function 03/19/2009  . INSOMNIA, PERSISTENT 11/07/2008  . LBP radiating to both legs 09/04/2008  . Atrial flutter ([facility]) 05/31/2008  . Essential hypertension 11/30/2006  . OSTEOARTHRITIS 11/30/2006  . Disorder of bone and cartilage 11/30/2006   Past Medical History:  Diagnosis Date  . Anticoagulated on Coumadin   . Anxiety    denies  . Bilateral edema of lower extremity   . Cancer  ([facility])    left breast  . Chronic constipation   . Diverticulosis of colon   . GERD (gastroesophageal reflux disease)   . H/O hiatal hernia   . History of cellulitis    LEFT LOWER LEG --  SEPT 2015  . History of colitis    DX  2003  WITH ISCHEMIC COLITIS  . History of diverticulitis of colon    perferated diverticulitis w/ abscess 08-25-2013 drain placed --  resolved without surgical intervention  . History of GI bleed    2011--- UPPER SECONARY GASTRIC ULCER FROM NSAID USE  . History of Helicobacter pylori infection    2011  . History of ulcer of lower limb    2012   RIGHT LOWER EXTREMITIY--  STATSIS ULCER  . Hyperlipidemia   . Hypertension   . Hypothyroidism   . Iron deficiency anemia 2011   elevated MCV  . LBP (low back pain)    severe lumbar spondylosis s/p L3/L4,L4/L5 fusion 12/10  . Lumbar spondylolysis   . OA (osteoarthritis)    "hands" (08/25/2013)  . Open wound of left lower leg   . Osteopenia   . Persistent atrial fibrillation ([facility])    CARDIOLOGIST-   DR MCALANY  . RBBB    at fib  . Urinary frequency     Family History  Problem Relation Age of Onset  . Cancer Father   . Hypertension Other     Past Surgical History:  Procedure Laterality Date  . APPLICATION OF A-CELL OF EXTREMITY Left 06/21/2013   Procedure: APPLICATION OF A-CELL OF EXTREMITY;  Surgeon: Theodoro Kos, DO;  Location: [facility];  Service: Plastics;  Laterality: Left;  . APPLICATION OF A-CELL OF EXTREMITY Left 08/22/2013   Procedure: APPLICATION OF A-CELL/VAC TO LOWER LEFT LEG WOUND;  Surgeon: Theodoro Kos, DO;  Location: [facility];  Service: Plastics;  Laterality: Left;  . APPLICATION OF A-CELL OF EXTREMITY Left 02/06/2014   Procedure: WITH PLACEMENT OF A -CELL ;  Surgeon: Theodoro Kos, DO;  Location: [facility];  Service: Plastics;  Laterality: Left;  . APPLICATION OF A-CELL OF EXTREMITY Left 08/09/2014   Procedure: PLACEMENT OF A CELL;  Surgeon: Theodoro Kos, DO;   Location: [facility];  Service: Plastics;  Laterality: Left;  . APPLICATION OF WOUND VAC Left 06/16/2013   Procedure: APPLICATION OF WOUND VAC;  Surgeon: Meredith Pel, MD;  Location: [facility];  Service: Orthopedics;  Laterality: Left;  . APPLICATION OF WOUND VAC Left 06/21/2013   Procedure: APPLICATION OF WOUND VAC;  Surgeon: Theodoro Kos, DO;  Location: [facility];  Service: Plastics;  Laterality: Left;  . BIOPSY BREAST  07/25/2015   LEFT RADIOACTIVE SEED GUIDED EXCISIONAL BREAST BIOPSY (Left) as a surgical intervention  . EXTERNAL FIXATION LEG Left 06/16/2013   Procedure: EXTERNAL FIXATION LEG;  Surgeon: Meredith Pel, MD;  Location: [facility];  Service: Orthopedics;  Laterality: Left;  . EXTERNAL FIXATION REMOVAL Left 06/21/2013   Procedure: REMOVAL EXTERNAL  FIXATION LEG;  Surgeon: Meredith Pel, MD;  Location: [facility];  Service: Orthopedics;  Laterality: Left;  . EYE SURGERY Bilateral    cataract removal  . HARDWARE REMOVAL Left 05/23/2014   Procedure: HARDWARE REMOVAL;  Surgeon: Meredith Pel, MD;  Location: [facility];  Service: Orthopedics;  Laterality: Left;  REMOVAL OF HARDWARE LEFT TIBIA  . I&D EXTREMITY Left 06/16/2013   Procedure: IRRIGATION AND DEBRIDEMENT EXTREMITY;  Surgeon: Meredith Pel, MD;  Location: [facility];  Service: Orthopedics;  Laterality: Left;  . I&D EXTREMITY Left 02/06/2014   Procedure: IRRIGATION AND DEBRIDEMENT LEFT LEG WOUND ;  Surgeon: Theodoro Kos, DO;  Location: [facility];  Service: Plastics;  Laterality: Left;  . I&D EXTREMITY Left 08/09/2014   Procedure: IRRIGATION AND DEBRIDEMENT  OF LEFT LOWER LEG;  Surgeon: Theodoro Kos, DO;  Location: [facility];  Service: Plastics;  Laterality: Left;  . LUMBAR FUSION  01-02-2009   L3-L5  . RADIOACTIVE SEED GUIDED EXCISIONAL BREAST BIOPSY Left 07/25/2015   Procedure: LEFT RADIOACTIVE SEED GUIDED EXCISIONAL BREAST BIOPSY;  Surgeon: Rolm Bookbinder, MD;  Location: [facility];  Service: General;  Laterality: Left;  .  RE-EXCISION OF BREAST LUMPECTOMY Left 09/05/2015   Procedure: RE-EXCISION OF LEFT BREAST LUMPECTOMY;  Surgeon: Rolm Bookbinder, MD;  Location: [name];  Service: General;  Laterality: Left;  . SHOULDER OPEN ROTATOR CUFF REPAIR Left 1997  . TIBIA IM NAIL INSERTION Left 06/21/2013   Procedure: INTRAMEDULLARY (IM) NAIL TIBIAL;  Surgeon: Meredith Pel, MD;  Location: [facility];  Service: Orthopedics;  Laterality: Left;  . TONSILLECTOMY  AS CHILD  . TOTAL HIP ARTHROPLASTY Right 06-07-2010  . TRANSTHORACIC ECHOCARDIOGRAM  09-11-2010   DR MCALHANY   LVSF  55-60%/  MILD MV CALCIFICATION WITH NO STENOSIS/  MILD MR & TR / MODERATE LAE/  MODERATE TO SEVERE RAE  . UPPER GI ENDOSCOPY  03-29-2010   Social History   Occupational History  . retired Unemployed   Social History Main Topics  . Smoking status: Never Smoker  . Smokeless tobacco: Never Used  . Alcohol use No     Comment: 1 glass occ wine 08/03/14- none in a year  . Drug use: No  . Sexual activity: Not on file

## 2016-02-15 ENCOUNTER — Encounter (INDEPENDENT_AMBULATORY_CARE_PROVIDER_SITE_OTHER): Payer: Self-pay | Admitting: Radiology

## 2016-02-21 ENCOUNTER — Telehealth (INDEPENDENT_AMBULATORY_CARE_PROVIDER_SITE_OTHER): Payer: Self-pay | Admitting: *Deleted

## 2016-02-21 NOTE — Telephone Encounter (Signed)
Patient called in this morning in regards to wanting to speak with Abigail Butts with some concerns she would not give me any details on what it was she needed. Her CB # [phone]. Thank you

## 2016-02-21 NOTE — Telephone Encounter (Signed)
Can you call her, and tell her continue taking doxycycline, apply bactroban to leg, and come see [place] in 1 week?  You can have Cassandra open a spot for her, next Thursday.  Thanks-

## 2016-02-21 NOTE — Telephone Encounter (Signed)
Patient made appointment for Thursday Feb 8th @ 10:45 and will continue with the doxy and bactroban.

## 2016-02-21 NOTE — Telephone Encounter (Signed)
Come in in a week continue current care

## 2016-02-21 NOTE — Telephone Encounter (Signed)
Patient says that since last visit, she has had increased drainage from the leg.  She has bactroban, and doxycycline. Please advise?

## 2016-02-28 ENCOUNTER — Encounter (INDEPENDENT_AMBULATORY_CARE_PROVIDER_SITE_OTHER): Payer: Self-pay | Admitting: Orthopedic Surgery

## 2016-02-28 ENCOUNTER — Ambulatory Visit (INDEPENDENT_AMBULATORY_CARE_PROVIDER_SITE_OTHER): Payer: Medicare Other | Admitting: Orthopedic Surgery

## 2016-02-28 DIAGNOSIS — M949 Disorder of cartilage, unspecified: Secondary | ICD-10-CM | POA: Diagnosis not present

## 2016-02-28 DIAGNOSIS — M899 Disorder of bone, unspecified: Secondary | ICD-10-CM

## 2016-02-28 MED ORDER — MUPIROCIN 2 % EX OINT: TOPICAL_OINTMENT | CUTANEOUS | 0 refills | Status: DC

## 2016-02-28 NOTE — Progress Notes (Signed)
Office Visit Note   Patient: [name]           Date of Birth: 17-Jul-1925           MRN: [id number] Visit Date: 02/28/2016 Requested by: [name], MD [place], [place] [address] PCP: Walker Kehr, MD  Subjective: Chief Complaint  Patient presents with  . Left Leg - Follow-up    HPI recent 83 year old female with long history of left leg open fracture several years ago which is gone on to chronic draining fistula.  She is on doxycycline twice a day.  In general she does very well with this and can ambulate.  She comes in today because of a reported increasing drainage.  She is having no fevers or chills.              Review of Systems All systems reviewed are negative as they relate to the chief complaint within the history of present illness.  Patient denies  fevers or chills.    Assessment & Plan: Visit Diagnoses:  1. Disorder of bone and cartilage     Plan: Impression is no appreciable change in the left leg fistula.  No erythema or drainage no real significant problems I would have her continue the current treatment plan and follow-up as needed  Follow-Up Instructions: No Follow-up on file.   Orders:  No orders of the defined types were placed in this encounter.  No orders of the defined types were placed in this encounter.     Procedures: No procedures performed   Clinical Data: No additional findings.  Objective: Vital Signs: There were no vitals taken for this visit.  Physical Exam   Constitutional: Patient appears well-developed HEENT:  Head: Normocephalic Eyes:EOM are normal Neck: Normal range of motion Cardiovascular: Normal rate Pulmonary/chest: Effort normal Neurologic: Patient is alert Skin: Skin is warm Psychiatric: Patient has normal mood and affect    Ortho Exam orthopedic exam demonstrates small fistula at the distal tibia.  Dissection closed over with granulation tissue.  There is no redness erythema and  drainage warmth or pain to palpation of this area.  Specialty Comments:  No specialty comments available.  Imaging: No results found.   PMFS History: Patient Active Problem List   Diagnosis Date Noted  . Breast cancer of upper-outer quadrant of left female breast ([facility]) 08/10/2015  . Breast mass in female 07/25/2015  . Left breast mass 07/05/2015  . CAP (community acquired pneumonia) 03/06/2015  . Incontinence in female 07/05/2014  . Edema 01/17/2014  . Morbilliform rash 09/06/2013  . Sepsis ([facility]) 08/25/2013  . Metabolic acidosis 99991111  . Diverticulitis of colon with perforation 08/25/2013  . Unspecified constipation 06/22/2013  . Open tibial fracture 06/17/2013  . Encounter for therapeutic drug monitoring 02/16/2013  . Well adult exam 07/21/2012  . Paresthesia 01/28/2012  . Chronic venous insufficiency 11/11/2010  . Actinic keratoses 05/30/2010  . Long term current use of anticoagulant 03/12/2010  . PEPTIC ULCER DISEASE 11/22/2009  . Iron deficiency anemia 10/31/2009  . HIP PAIN 10/31/2009  . Hypothyroidism 10/25/2009  . Disorder resulting from impaired renal function 03/19/2009  . INSOMNIA, PERSISTENT 11/07/2008  . LBP radiating to both legs 09/04/2008  . Atrial flutter ([facility]) 05/31/2008  . Essential hypertension 11/30/2006  . OSTEOARTHRITIS 11/30/2006  . Disorder of bone and cartilage 11/30/2006   Past Medical History:  Diagnosis Date  . Anticoagulated on Coumadin   . Anxiety    denies  . Bilateral  edema of lower extremity   . Cancer ([facility])    left breast  . Chronic constipation   . Diverticulosis of colon   . GERD (gastroesophageal reflux disease)   . H/O hiatal hernia   . History of cellulitis    LEFT LOWER LEG --  SEPT 2015  . History of colitis    DX  2003  WITH ISCHEMIC COLITIS  . History of diverticulitis of colon    perferated diverticulitis w/ abscess 08-25-2013 drain placed --  resolved without surgical intervention  . History of GI bleed     2011--- UPPER SECONARY GASTRIC ULCER FROM NSAID USE  . History of Helicobacter pylori infection    2011  . History of ulcer of lower limb    2012   RIGHT LOWER EXTREMITIY--  STATSIS ULCER  . Hyperlipidemia   . Hypertension   . Hypothyroidism   . Iron deficiency anemia 2011   elevated MCV  . LBP (low back pain)    severe lumbar spondylosis s/p L3/L4,L4/L5 fusion 12/10  . Lumbar spondylolysis   . OA (osteoarthritis)    "hands" (08/25/2013)  . Open wound of left lower leg   . Osteopenia   . Persistent atrial fibrillation ([facility])    CARDIOLOGIST-   DR MCALANY  . RBBB    at fib  . Urinary frequency     Family History  Problem Relation Age of Onset  . Cancer Father   . Hypertension Other     Past Surgical History:  Procedure Laterality Date  . APPLICATION OF A-CELL OF EXTREMITY Left 06/21/2013   Procedure: APPLICATION OF A-CELL OF EXTREMITY;  Surgeon: Theodoro Kos, DO;  Location: [facility];  Service: Plastics;  Laterality: Left;  . APPLICATION OF A-CELL OF EXTREMITY Left 08/22/2013   Procedure: APPLICATION OF A-CELL/VAC TO LOWER LEFT LEG WOUND;  Surgeon: Theodoro Kos, DO;  Location: [facility];  Service: Plastics;  Laterality: Left;  . APPLICATION OF A-CELL OF EXTREMITY Left 02/06/2014   Procedure: WITH PLACEMENT OF A -CELL ;  Surgeon: Theodoro Kos, DO;  Location: [facility];  Service: Plastics;  Laterality: Left;  . APPLICATION OF A-CELL OF EXTREMITY Left 08/09/2014   Procedure: PLACEMENT OF A CELL;  Surgeon: Theodoro Kos, DO;  Location: [facility];  Service: Plastics;  Laterality: Left;  . APPLICATION OF WOUND VAC Left 06/16/2013   Procedure: APPLICATION OF WOUND VAC;  Surgeon: Meredith Pel, MD;  Location: [facility];  Service: Orthopedics;  Laterality: Left;  . APPLICATION OF WOUND VAC Left 06/21/2013   Procedure: APPLICATION OF WOUND VAC;  Surgeon: Theodoro Kos, DO;  Location: [facility];  Service: Plastics;  Laterality: Left;  . BIOPSY BREAST  07/25/2015   LEFT  RADIOACTIVE SEED GUIDED EXCISIONAL BREAST BIOPSY (Left) as a surgical intervention  . EXTERNAL FIXATION LEG Left 06/16/2013   Procedure: EXTERNAL FIXATION LEG;  Surgeon: Meredith Pel, MD;  Location: [facility];  Service: Orthopedics;  Laterality: Left;  . EXTERNAL FIXATION REMOVAL Left 06/21/2013   Procedure: REMOVAL EXTERNAL FIXATION LEG;  Surgeon: Meredith Pel, MD;  Location: [facility];  Service: Orthopedics;  Laterality: Left;  . EYE SURGERY Bilateral    cataract removal  . HARDWARE REMOVAL Left 05/23/2014   Procedure: HARDWARE REMOVAL;  Surgeon: Meredith Pel, MD;  Location: [facility];  Service: Orthopedics;  Laterality: Left;  REMOVAL OF HARDWARE LEFT TIBIA  . I&D EXTREMITY Left 06/16/2013   Procedure: IRRIGATION AND DEBRIDEMENT EXTREMITY;  Surgeon: Meredith Pel,  MD;  Location: [facility];  Service: Orthopedics;  Laterality: Left;  . I&D EXTREMITY Left 02/06/2014   Procedure: IRRIGATION AND DEBRIDEMENT LEFT LEG WOUND ;  Surgeon: Theodoro Kos, DO;  Location: [facility];  Service: Plastics;  Laterality: Left;  . I&D EXTREMITY Left 08/09/2014   Procedure: IRRIGATION AND DEBRIDEMENT  OF LEFT LOWER LEG;  Surgeon: Theodoro Kos, DO;  Location: [facility];  Service: Plastics;  Laterality: Left;  . LUMBAR FUSION  01-02-2009   L3-L5  . RADIOACTIVE SEED GUIDED EXCISIONAL BREAST BIOPSY Left 07/25/2015   Procedure: LEFT RADIOACTIVE SEED GUIDED EXCISIONAL BREAST BIOPSY;  Surgeon: Rolm Bookbinder, MD;  Location: [facility];  Service: General;  Laterality: Left;  . RE-EXCISION OF BREAST LUMPECTOMY Left 09/05/2015   Procedure: RE-EXCISION OF LEFT BREAST LUMPECTOMY;  Surgeon: Rolm Bookbinder, MD;  Location: [facility];  Service: General;  Laterality: Left;  . SHOULDER OPEN ROTATOR CUFF REPAIR Left 1997  . TIBIA IM NAIL INSERTION Left 06/21/2013   Procedure: INTRAMEDULLARY (IM) NAIL TIBIAL;  Surgeon: Meredith Pel, MD;  Location: [facility];  Service: Orthopedics;  Laterality: Left;  . TONSILLECTOMY  AS  CHILD  . TOTAL HIP ARTHROPLASTY Right 06-07-2010  . TRANSTHORACIC ECHOCARDIOGRAM  09-11-2010   DR MCALHANY   LVSF  55-60%/  MILD MV CALCIFICATION WITH NO STENOSIS/  MILD MR & TR / MODERATE LAE/  MODERATE TO SEVERE RAE  . UPPER GI ENDOSCOPY  03-29-2010   Social History   Occupational History  . retired Unemployed   Social History Main Topics  . Smoking status: Never Smoker  . Smokeless tobacco: Never Used  . Alcohol use No     Comment: 1 glass occ wine 08/03/14- none in a year  . Drug use: No  . Sexual activity: Not on file

## 2016-03-03 ENCOUNTER — Other Ambulatory Visit: Payer: Self-pay | Admitting: *Deleted

## 2016-03-03 MED ORDER — ATORVASTATIN CALCIUM 10 MG PO TABS: 10.0000 mg | ORAL_TABLET | Freq: Every evening | ORAL | 2 refills | Status: DC

## 2016-03-04 ENCOUNTER — Ambulatory Visit (INDEPENDENT_AMBULATORY_CARE_PROVIDER_SITE_OTHER): Payer: Medicare Other | Admitting: Pharmacist

## 2016-03-04 DIAGNOSIS — I4892 Unspecified atrial flutter: Secondary | ICD-10-CM

## 2016-03-04 DIAGNOSIS — Z5181 Encounter for therapeutic drug level monitoring: Secondary | ICD-10-CM

## 2016-03-04 LAB — POCT INR: INR: 2

## 2016-03-07 ENCOUNTER — Encounter (INDEPENDENT_AMBULATORY_CARE_PROVIDER_SITE_OTHER): Payer: Self-pay | Admitting: Radiology

## 2016-03-10 ENCOUNTER — Other Ambulatory Visit: Payer: Self-pay | Admitting: *Deleted

## 2016-03-10 MED ORDER — ANASTROZOLE 1 MG PO TABS: 1.0000 mg | ORAL_TABLET | Freq: Every day | ORAL | 3 refills | Status: DC

## 2016-03-18 ENCOUNTER — Ambulatory Visit: Payer: Medicare Other | Admitting: Internal Medicine

## 2016-04-03 ENCOUNTER — Encounter: Payer: Self-pay | Admitting: Cardiovascular Disease

## 2016-04-14 ENCOUNTER — Ambulatory Visit (INDEPENDENT_AMBULATORY_CARE_PROVIDER_SITE_OTHER): Payer: Medicare Other | Admitting: Cardiovascular Disease

## 2016-04-14 ENCOUNTER — Ambulatory Visit (INDEPENDENT_AMBULATORY_CARE_PROVIDER_SITE_OTHER): Payer: Medicare Other | Admitting: *Deleted

## 2016-04-14 VITALS — BP 122/80 | HR 81 | Ht 65.5 in | Wt 182.8 lb

## 2016-04-14 DIAGNOSIS — Z5181 Encounter for therapeutic drug level monitoring: Secondary | ICD-10-CM | POA: Diagnosis not present

## 2016-04-14 DIAGNOSIS — I4892 Unspecified atrial flutter: Secondary | ICD-10-CM | POA: Diagnosis not present

## 2016-04-14 DIAGNOSIS — I10 Essential (primary) hypertension: Secondary | ICD-10-CM | POA: Diagnosis not present

## 2016-04-14 DIAGNOSIS — I481 Persistent atrial fibrillation: Secondary | ICD-10-CM | POA: Diagnosis not present

## 2016-04-14 DIAGNOSIS — I4819 Other persistent atrial fibrillation: Secondary | ICD-10-CM

## 2016-04-14 LAB — POCT INR: INR: 2

## 2016-04-14 NOTE — Patient Instructions (Signed)

## 2016-04-14 NOTE — Progress Notes (Signed)
Chief Complaint  Patient presents with  . Follow-up    atrial fib     History of Present Illness: 83 yo WF with history of HTN, chronic low back pain, iron deficiency anemia and paroxysmal atrial fibrillation/flutter who is here today for follow up. She is known to have atrial fib/flutter and has been managed on coumadin and rate control agents. In 2011 she had dark stools with anemia. Coumadin was held and upper endoscopy showed several small clean based gastric ulcers. She has chronic lower ext edema controlled with Lasix. She has been diagnosed with left breast cancer and has had lumpectomy per Dr. Donne Hazel July 2017. She is on oral therapy.  She is here today for follow up. She has not been aware of any tachycardia or skipped beats. No chest pain or SOB. No bleeding issues. She continues to have pain in her left leg with slow healing of the ulcer.   Primary Care Physician: Walker Kehr, MD   Past Medical History:  Diagnosis Date  . Anticoagulated on Coumadin   . Anxiety    denies  . Bilateral edema of lower extremity   . Cancer ([place])    left breast  . Chronic constipation   . Diverticulosis of colon   . GERD (gastroesophageal reflux disease)   . H/O hiatal hernia   . History of cellulitis    LEFT LOWER LEG --  SEPT 2015  . History of colitis    DX  2003  WITH ISCHEMIC COLITIS  . History of diverticulitis of colon    perferated diverticulitis w/ abscess 08-25-2013 drain placed --  resolved without surgical intervention  . History of GI bleed    2011--- UPPER SECONARY GASTRIC ULCER FROM NSAID USE  . History of Helicobacter pylori infection    2011  . History of ulcer of lower limb    2012   RIGHT LOWER EXTREMITIY--  STATSIS ULCER  . Hyperlipidemia   . Hypertension   . Hypothyroidism   . Iron deficiency anemia 2011   elevated MCV  . LBP (low back pain)    severe lumbar spondylosis s/p L3/L4,L4/L5 fusion 12/10  . Lumbar spondylolysis   . OA (osteoarthritis)    "hands" (08/25/2013)  . Open wound of left lower leg   . Osteopenia   . Persistent atrial fibrillation ([place])    CARDIOLOGIST-   DR MCALANY  . RBBB    at fib  . Urinary frequency     Past Surgical History:  Procedure Laterality Date  . APPLICATION OF A-CELL OF EXTREMITY Left 06/21/2013   Procedure: APPLICATION OF A-CELL OF EXTREMITY;  Surgeon: Theodoro Kos, DO;  Location: [place];  Service: Plastics;  Laterality: Left;  . APPLICATION OF A-CELL OF EXTREMITY Left 08/22/2013   Procedure: APPLICATION OF A-CELL/VAC TO LOWER LEFT LEG WOUND;  Surgeon: Theodoro Kos, DO;  Location: [place];  Service: Plastics;  Laterality: Left;  . APPLICATION OF A-CELL OF EXTREMITY Left 02/06/2014   Procedure: WITH PLACEMENT OF A -CELL ;  Surgeon: Theodoro Kos, DO;  Location: [place];  Service: Plastics;  Laterality: Left;  . APPLICATION OF A-CELL OF EXTREMITY Left 08/09/2014   Procedure: PLACEMENT OF A CELL;  Surgeon: Theodoro Kos, DO;  Location: [place];  Service: Plastics;  Laterality: Left;  . APPLICATION OF WOUND VAC Left 06/16/2013   Procedure: APPLICATION OF WOUND VAC;  Surgeon: Meredith Pel, MD;  Location: [place];  Service: Orthopedics;  Laterality: Left;  .  APPLICATION OF WOUND VAC Left 06/21/2013   Procedure: APPLICATION OF WOUND VAC;  Surgeon: Theodoro Kos, DO;  Location: [place];  Service: Plastics;  Laterality: Left;  . BIOPSY BREAST  07/25/2015   LEFT RADIOACTIVE SEED GUIDED EXCISIONAL BREAST BIOPSY (Left) as a surgical intervention  . EXTERNAL FIXATION LEG Left 06/16/2013   Procedure: EXTERNAL FIXATION LEG;  Surgeon: Meredith Pel, MD;  Location: [place];  Service: Orthopedics;  Laterality: Left;  . EXTERNAL FIXATION REMOVAL Left 06/21/2013   Procedure: REMOVAL EXTERNAL FIXATION LEG;  Surgeon: Meredith Pel, MD;  Location: [place];  Service: Orthopedics;  Laterality: Left;  . EYE SURGERY Bilateral    cataract removal  . HARDWARE REMOVAL Left 05/23/2014    Procedure: HARDWARE REMOVAL;  Surgeon: Meredith Pel, MD;  Location: [place];  Service: Orthopedics;  Laterality: Left;  REMOVAL OF HARDWARE LEFT TIBIA  . I&D EXTREMITY Left 06/16/2013   Procedure: IRRIGATION AND DEBRIDEMENT EXTREMITY;  Surgeon: Meredith Pel, MD;  Location: [place];  Service: Orthopedics;  Laterality: Left;  . I&D EXTREMITY Left 02/06/2014   Procedure: IRRIGATION AND DEBRIDEMENT LEFT LEG WOUND ;  Surgeon: Theodoro Kos, DO;  Location: [place];  Service: Plastics;  Laterality: Left;  . I&D EXTREMITY Left 08/09/2014   Procedure: IRRIGATION AND DEBRIDEMENT  OF LEFT LOWER LEG;  Surgeon: Theodoro Kos, DO;  Location: [place];  Service: Plastics;  Laterality: Left;  . LUMBAR FUSION  01-02-2009   L3-L5  . RADIOACTIVE SEED GUIDED EXCISIONAL BREAST BIOPSY Left 07/25/2015   Procedure: LEFT RADIOACTIVE SEED GUIDED EXCISIONAL BREAST BIOPSY;  Surgeon: Rolm Bookbinder, MD;  Location: [place];  Service: General;  Laterality: Left;  . RE-EXCISION OF BREAST LUMPECTOMY Left 09/05/2015   Procedure: RE-EXCISION OF LEFT BREAST LUMPECTOMY;  Surgeon: Rolm Bookbinder, MD;  Location: [place];  Service: General;  Laterality: Left;  . SHOULDER OPEN ROTATOR CUFF REPAIR Left 1997  . TIBIA IM NAIL INSERTION Left 06/21/2013   Procedure: INTRAMEDULLARY (IM) NAIL TIBIAL;  Surgeon: Meredith Pel, MD;  Location: [place];  Service: Orthopedics;  Laterality: Left;  . TONSILLECTOMY  AS CHILD  . TOTAL HIP ARTHROPLASTY Right 06-07-2010  . TRANSTHORACIC ECHOCARDIOGRAM  09-11-2010   DR Quavis Klutz   LVSF  55-60%/  MILD MV CALCIFICATION WITH NO STENOSIS/  MILD MR & TR / MODERATE LAE/  MODERATE TO SEVERE RAE  . UPPER GI ENDOSCOPY  03-29-2010    Current Outpatient Prescriptions  Medication Sig Dispense Refill  . acetaminophen (TYLENOL) 500 MG tablet Take 500 mg by mouth daily as needed for mild pain.     [name] anastrozole (ARIMIDEX) 1 MG tablet Take 1 tablet (1 mg total) by mouth daily. 90 tablet 3  .  atorvastatin (LIPITOR) 10 MG tablet Take 1 tablet (10 mg total) by mouth every evening. 90 tablet 2  . B Complex-C (B-COMPLEX WITH VITAMIN C) tablet Take 1 tablet by mouth daily.    . busPIRone (BUSPAR) 7.5 MG tablet Take 1 tablet (7.5 mg total) by mouth 2 (two) times daily. Yearly physical due in June must see MD for refills 180 tablet 1  . Cholecalciferol (VITAMIN D3) 1000 UNITS tablet Take 1,000 Units by mouth daily.      [name] diltiazem (CARDIZEM CD) 120 MG 24 hr capsule TAKE 1 CAPSULE (120 MG TOTAL) BY MOUTH DAILY. 90 capsule 3  . doxycycline (VIBRAMYCIN) 100 MG capsule Take 1 capsule (100 mg total) by mouth 2 (two) times daily. Yearly physical due in June  must see MD for refills 60 capsule 5  . Ferrous Sulfate (IRON) 325 (65 FE) MG TABS Take 1 tablet by mouth daily.    . furosemide (LASIX) 40 MG tablet Take 1 tablet (40 mg total) by mouth as directed. 1 po on Mon-Wed-Fri in am 90 tablet 1  . KLOR-CON M10 10 MEQ tablet Take 1 tablet (10 mEq total) by mouth daily. Yearly physical due in June must see Md for refills 90 tablet 1  . levothyroxine (SYNTHROID, LEVOTHROID) 25 MCG tablet TAKE 1 TABLET BY MOUTH ONCE DAILY FOR THYROID 90 tablet 3  . metoprolol succinate (TOPROL-XL) 50 MG 24 hr tablet Take 1 tablet (50 mg total) by mouth 2 (two) times daily. Take with or immediately following a meal. 180 tablet 3  . Multiple Vitamins-Minerals (MULTIVITAMIN WITH MINERALS) tablet Take 1 tablet by mouth daily.    . mupirocin ointment (BACTROBAN) 2 % Applied to leg twice daily 22 g 0  . omeprazole (PRILOSEC) 20 MG capsule Take 20 mg by mouth daily as needed.    [name] oxybutynin (DITROPAN) 5 MG tablet Take 1 tablet (5 mg total) by mouth 2 (two) times daily. Yearly physical due in June must see MD for refills 180 tablet 1  . polyethylene glycol (MIRALAX / GLYCOLAX) packet Take 17 g by mouth daily as needed for moderate constipation.     . temazepam (RESTORIL) 30 MG capsule Take 1 capsule (30 mg total) by mouth at  bedtime. 30 capsule 5  . traMADol (ULTRAM) 50 MG tablet Take 1-2 tablets (50-100 mg total) by mouth every 6 (six) hours as needed. 10 tablet 0  . vitamin C (ASCORBIC ACID) 500 MG tablet Take 500 mg by mouth daily.    [name] warfarin (COUMADIN) 5 MG tablet TAKE AS DIRECTED BY THE COUMADIN CLINIC 30 tablet 3   No current facility-administered medications for this visit.     Allergies  Allergen Reactions  . Zosyn [Piperacillin Sod-Tazobactam So] Hives, Rash and Other (See Comments)    Morbilliform eruption with itching  . Atenolol Nausea And Vomiting  . Clarithromycin Nausea And Vomiting  . Codeine Sulfate Nausea Only  . Levaquin [Levofloxacin] Nausea Only  . Macrodantin Other (See Comments)    UNSPECIFIED   . Oxycodone-Acetaminophen Nausea And Vomiting  . Percocet [Oxycodone-Acetaminophen] Nausea And Vomiting    Social History   Social History  . Marital status: Married    Spouse name: N/A  . Number of children: 2  . Years of education: N/A   Occupational History  . retired Unemployed   Social History Main Topics  . Smoking status: Never Smoker  . Smokeless tobacco: Never Used  . Alcohol use No     Comment: 1 glass occ wine 08/03/14- none in a year  . Drug use: No  . Sexual activity: Not on file   Other Topics Concern  . Not on file   Social History Narrative  . No narrative on file    Family History  Problem Relation Age of Onset  . Cancer Father   . Hypertension Other     Review of Systems:  As stated in the HPI and otherwise negative.   BP 122/80   Pulse 81   Ht 5' 5.5" (1.664 m)   Wt 182 lb 12.8 oz (82.9 kg)   BMI 29.96 kg/m   Physical Examination: General: Well developed, well nourished, NAD  HEENT: OP clear, mucus membranes moist  SKIN: warm, dry. No rashes. Neuro: No focal deficits  Musculoskeletal: Muscle strength 5/5 all ext  Psychiatric: Mood and affect normal  Neck: No JVD, no carotid bruits, no thyromegaly, no lymphadenopathy.  Lungs:Clear  bilaterally, no wheezes, rhonci, crackles Cardiovascular: Irregular  rate and rhythm. No murmurs, gallops or rubs. Abdomen:Soft. Bowel sounds present. Non-tender.  Extremities: No lower extremity edema. Pulses are 2 + in the bilateral DP/PT. Chronic venous stasis changes.   EKG:  EKG is  ordered today. The ekg ordered today demonstrates Atrial fib, rate 81 bpm. PVC. RBBB  Recent Labs: 10/22/2015: ALT 20; BUN 13.4; Creatinine 0.9; HGB 14.2; Platelets 214; Potassium 3.5; Sodium 138   Lipid Panel    Component Value Date/Time   CHOL 116 06/25/2012 1619   TRIG 82 09/05/2013 0300   HDL 41.50 06/25/2012 1619   CHOLHDL 3 06/25/2012 1619   VLDL 8.2 06/25/2012 1619   LDLCALC 66 06/25/2012 1619   LDLDIRECT 110.3 05/24/2008 1041     Wt Readings from Last 3 Encounters:  04/14/16 182 lb 12.8 oz (82.9 kg)  11/13/15 182 lb (82.6 kg)  10/22/15 180 lb 3.2 oz (81.7 kg)     Other studies Reviewed: Additional studies/ records that were reviewed today include: . Review of the above records demonstrates:    Assessment and Plan:   1. Atrial fibrillation, persistent: She is in atrial fibrillation today. Rate is controlled. She is anti-coagulated with coumadin with no bleeding issues. Continue beta blocker and Cardizem.   2. HTN: BP is controlled. No changes in therapy.   Current medicines are reviewed at length with the patient today.  The patient does not have concerns regarding medicines.  The following changes have been made:  no change  Labs/ tests ordered today include:  No orders of the defined types were placed in this encounter.   Disposition:   FU with me in 6 months  Signed, Lauree Chandler, MD 04/14/2016 1:40 PM    [facility] [place], [place], [place]  [address] Phone: [phone]; Fax: [phone]

## 2016-04-15 ENCOUNTER — Other Ambulatory Visit: Payer: Self-pay

## 2016-04-15 DIAGNOSIS — I4819 Other persistent atrial fibrillation: Secondary | ICD-10-CM

## 2016-04-21 ENCOUNTER — Other Ambulatory Visit (INDEPENDENT_AMBULATORY_CARE_PROVIDER_SITE_OTHER): Payer: Medicare Other

## 2016-04-21 ENCOUNTER — Ambulatory Visit (INDEPENDENT_AMBULATORY_CARE_PROVIDER_SITE_OTHER): Payer: Medicare Other | Admitting: Internal Medicine

## 2016-04-21 ENCOUNTER — Encounter: Payer: Self-pay | Admitting: Internal Medicine

## 2016-04-21 ENCOUNTER — Other Ambulatory Visit: Payer: Self-pay | Admitting: Internal Medicine

## 2016-04-21 DIAGNOSIS — I10 Essential (primary) hypertension: Secondary | ICD-10-CM

## 2016-04-21 DIAGNOSIS — E039 Hypothyroidism, unspecified: Secondary | ICD-10-CM

## 2016-04-21 DIAGNOSIS — I484 Atypical atrial flutter: Secondary | ICD-10-CM

## 2016-04-21 DIAGNOSIS — S82392S Other fracture of lower end of left tibia, sequela: Secondary | ICD-10-CM

## 2016-04-21 DIAGNOSIS — Z7901 Long term (current) use of anticoagulants: Secondary | ICD-10-CM | POA: Diagnosis not present

## 2016-04-21 DIAGNOSIS — M545 Low back pain, unspecified: Secondary | ICD-10-CM

## 2016-04-21 DIAGNOSIS — M79604 Pain in right leg: Secondary | ICD-10-CM

## 2016-04-21 LAB — CBC WITH DIFFERENTIAL/PLATELET
Basophils Absolute: 0.1 10*3/uL (ref 0.0–0.1)
Basophils Relative: 1 % (ref 0.0–3.0)
Eosinophils Absolute: 0.1 10*3/uL (ref 0.0–0.7)
Eosinophils Relative: 2.7 % (ref 0.0–5.0)
HCT: 41.1 % (ref 36.0–46.0)
Hemoglobin: 13.9 g/dL (ref 12.0–15.0)
Lymphocytes Relative: 31.7 % (ref 12.0–46.0)
Lymphs Abs: 1.7 10*3/uL (ref 0.7–4.0)
MCHC: 33.8 g/dL (ref 30.0–36.0)
MCV: 105.7 fl — ABNORMAL HIGH (ref 78.0–100.0)
Monocytes Absolute: 0.7 10*3/uL (ref 0.1–1.0)
Monocytes Relative: 12.3 % — ABNORMAL HIGH (ref 3.0–12.0)
Neutro Abs: 2.8 10*3/uL (ref 1.4–7.7)
Neutrophils Relative %: 52.3 % (ref 43.0–77.0)
Platelets: 196 10*3/uL (ref 150.0–400.0)
RBC: 3.89 Mil/uL (ref 3.87–5.11)
RDW: 13 % (ref 11.5–15.5)
WBC: 5.4 10*3/uL (ref 4.0–10.5)

## 2016-04-21 LAB — HEPATIC FUNCTION PANEL
ALT: 18 U/L (ref 0–35)
AST: 21 U/L (ref 0–37)
Albumin: 4.1 g/dL (ref 3.5–5.2)
Alkaline Phosphatase: 57 U/L (ref 39–117)
Bilirubin, Direct: 0.2 mg/dL (ref 0.0–0.3)
Total Bilirubin: 1.3 mg/dL — ABNORMAL HIGH (ref 0.2–1.2)
Total Protein: 7.3 g/dL (ref 6.0–8.3)

## 2016-04-21 LAB — BASIC METABOLIC PANEL
BUN: 18 mg/dL (ref 6–23)
CO2: 32 mEq/L (ref 19–32)
Calcium: 9.7 mg/dL (ref 8.4–10.5)
Chloride: 100 mEq/L (ref 96–112)
Creatinine, Ser: 0.97 mg/dL (ref 0.40–1.20)
GFR: 57.54 mL/min — ABNORMAL LOW (ref >60.00)
Glucose, Bld: 138 mg/dL — ABNORMAL HIGH (ref 70–99)
Potassium: 3.3 mEq/L — ABNORMAL LOW (ref 3.5–5.1)
Sodium: 141 mEq/L (ref 135–145)

## 2016-04-21 LAB — TSH: TSH: 0.54 u[IU]/mL (ref 0.35–4.50)

## 2016-04-21 MED ORDER — POTASSIUM CHLORIDE CRYS ER 20 MEQ PO TBCR: 20.0000 meq | EXTENDED_RELEASE_TABLET | Freq: Every day | ORAL | 3 refills | Status: DC

## 2016-04-21 MED ORDER — APIXABAN 2.5 MG PO TABS: 2.5000 mg | ORAL_TABLET | Freq: Two times a day (BID) | ORAL | 3 refills | Status: DC

## 2016-04-21 NOTE — Assessment & Plan Note (Signed)
On Levothroid 

## 2016-04-21 NOTE — Assessment & Plan Note (Signed)
Coumadin 

## 2016-04-21 NOTE — Assessment & Plan Note (Signed)
Diltiazem, Metoprolol

## 2016-04-21 NOTE — Progress Notes (Signed)
[name],  Please inform patient that all labs are normal except for a low K. Pls switch to Klor-con 20 meq/day -- Rx emailed Thx

## 2016-04-21 NOTE — Assessment & Plan Note (Signed)
Walker

## 2016-04-21 NOTE — Progress Notes (Signed)
Subjective:  Patient ID: [name], female    DOB: 01-28-1925  Age: 83 y.o. MRN: [id number]  CC: Hypertension and Hypothyroidism   HPI [name] presents for chronic pain, HTN, anxiety f/u  Outpatient Medications Prior to Visit  Medication Sig Dispense Refill  . acetaminophen (TYLENOL) 500 MG tablet Take 500 mg by mouth daily as needed for mild pain.     [name] anastrozole (ARIMIDEX) 1 MG tablet Take 1 tablet (1 mg total) by mouth daily. 90 tablet 3  . atorvastatin (LIPITOR) 10 MG tablet Take 1 tablet (10 mg total) by mouth every evening. 90 tablet 2  . B Complex-C (B-COMPLEX WITH VITAMIN C) tablet Take 1 tablet by mouth daily.    . busPIRone (BUSPAR) 7.5 MG tablet Take 1 tablet (7.5 mg total) by mouth 2 (two) times daily. Yearly physical due in June must see MD for refills 180 tablet 1  . Cholecalciferol (VITAMIN D3) 1000 UNITS tablet Take 1,000 Units by mouth daily.      [name] diltiazem (CARDIZEM CD) 120 MG 24 hr capsule TAKE 1 CAPSULE (120 MG TOTAL) BY MOUTH DAILY. 90 capsule 3  . doxycycline (VIBRAMYCIN) 100 MG capsule Take 1 capsule (100 mg total) by mouth 2 (two) times daily. Yearly physical due in June must see MD for refills 60 capsule 5  . Ferrous Sulfate (IRON) 325 (65 FE) MG TABS Take 1 tablet by mouth daily.    . furosemide (LASIX) 40 MG tablet Take 1 tablet (40 mg total) by mouth as directed. 1 po on Mon-Wed-Fri in am 90 tablet 1  . KLOR-CON M10 10 MEQ tablet Take 1 tablet (10 mEq total) by mouth daily. Yearly physical due in June must see Md for refills 90 tablet 1  . levothyroxine (SYNTHROID, LEVOTHROID) 25 MCG tablet TAKE 1 TABLET BY MOUTH ONCE DAILY FOR THYROID 90 tablet 3  . metoprolol succinate (TOPROL-XL) 50 MG 24 hr tablet Take 1 tablet (50 mg total) by mouth 2 (two) times daily. Take with or immediately following a meal. 180 tablet 3  . Multiple Vitamins-Minerals (MULTIVITAMIN WITH MINERALS) tablet Take 1 tablet by mouth daily.    . mupirocin ointment (BACTROBAN) 2 %  Applied to leg twice daily 22 g 0  . omeprazole (PRILOSEC) 20 MG capsule Take 20 mg by mouth daily as needed.    [name] oxybutynin (DITROPAN) 5 MG tablet Take 1 tablet (5 mg total) by mouth 2 (two) times daily. Yearly physical due in June must see MD for refills 180 tablet 1  . polyethylene glycol (MIRALAX / GLYCOLAX) packet Take 17 g by mouth daily as needed for moderate constipation.     . temazepam (RESTORIL) 30 MG capsule Take 1 capsule (30 mg total) by mouth at bedtime. 30 capsule 5  . traMADol (ULTRAM) 50 MG tablet Take 1-2 tablets (50-100 mg total) by mouth every 6 (six) hours as needed. 10 tablet 0  . vitamin C (ASCORBIC ACID) 500 MG tablet Take 500 mg by mouth daily.    [name] warfarin (COUMADIN) 5 MG tablet TAKE AS DIRECTED BY THE COUMADIN CLINIC 30 tablet 3   No facility-administered medications prior to visit.     ROS Review of Systems  Constitutional: Negative for activity change, appetite change, chills, fatigue and unexpected weight change.  HENT: Negative for congestion, mouth sores and sinus pressure.   Eyes: Negative for visual disturbance.  Respiratory: Negative for cough and chest tightness.   Gastrointestinal: Negative for abdominal pain and nausea.  Genitourinary: Negative for difficulty urinating, frequency and vaginal pain.  Musculoskeletal: Positive for arthralgias and gait problem. Negative for back pain.  Skin: Negative for pallor and rash.  Neurological: Negative for dizziness, tremors, weakness, numbness and headaches.  Psychiatric/Behavioral: Negative for confusion, sleep disturbance and suicidal ideas. The patient is nervous/anxious.     Objective:  BP 134/86   Pulse 64   Temp 97.7 F (36.5 C)   Ht 5' 5.5" (1.664 m)   Wt 182 lb (82.6 kg)   SpO2 99%   BMI 29.83 kg/m   BP Readings from Last 3 Encounters:  04/21/16 134/86  04/14/16 122/80  11/13/15 110/60    Wt Readings from Last 3 Encounters:  04/21/16 182 lb (82.6 kg)  04/14/16 182 lb 12.8 oz (82.9 kg)   11/13/15 182 lb (82.6 kg)    Physical Exam  Constitutional: She appears well-developed. No distress.  HENT:  Head: Normocephalic.  Right Ear: External ear normal.  Left Ear: External ear normal.  Nose: Nose normal.  Mouth/Throat: Oropharynx is clear and moist.  Eyes: Conjunctivae are normal. Pupils are equal, round, and reactive to light. Right eye exhibits no discharge. Left eye exhibits no discharge.  Neck: Normal range of motion. Neck supple. No JVD present. No tracheal deviation present. No thyromegaly present.  Cardiovascular: Normal rate, regular rhythm and normal heart sounds.   Pulmonary/Chest: No stridor. No respiratory distress. She has no wheezes.  Abdominal: Soft. Bowel sounds are normal. She exhibits no distension and no mass. There is no tenderness. There is no rebound and no guarding.  Musculoskeletal: She exhibits tenderness. She exhibits no edema.  Lymphadenopathy:    She has no cervical adenopathy.  Neurological: She displays normal reflexes. No cranial nerve deficit. She exhibits normal muscle tone. Coordination abnormal.  Skin: No rash noted. No erythema.  Psychiatric: She has a normal mood and affect. Her behavior is normal. Judgment and thought content normal.  walker Small bruise under L eye   Lab Results  Component Value Date   WBC 6.9 10/22/2015   HGB 14.2 10/22/2015   HCT 43.8 10/22/2015   PLT 214 10/22/2015   GLUCOSE 131 10/22/2015   CHOL 116 06/25/2012   TRIG 82 09/05/2013   HDL 41.50 06/25/2012   LDLDIRECT 110.3 05/24/2008   LDLCALC 66 06/25/2012   ALT 20 10/22/2015   AST 21 10/22/2015   NA 138 10/22/2015   K 3.5 10/22/2015   CL 102 08/30/2015   CREATININE 0.9 10/22/2015   BUN 13.4 10/22/2015   CO2 26 10/22/2015   TSH 0.51 01/17/2014   INR 2.0 04/14/2016   HGBA1C 6.1 (H) 09/08/2013    No results found.  Assessment & Plan:   There are no diagnoses linked to this encounter. I am having [name] maintain her cholecalciferol,  B-complex with vitamin C, Iron, acetaminophen, polyethylene glycol, vitamin C, levothyroxine, omeprazole, multivitamin with minerals, traMADol, diltiazem, metoprolol succinate, warfarin, temazepam, busPIRone, doxycycline, KLOR-CON M10, oxybutynin, furosemide, mupirocin ointment, atorvastatin, and anastrozole.  No orders of the defined types were placed in this encounter.    Follow-up: No Follow-up on file.  Walker Kehr, MD

## 2016-04-21 NOTE — Assessment & Plan Note (Addendum)
On Coumadin Eliquis switch suggested

## 2016-04-21 NOTE — Assessment & Plan Note (Signed)
On Doxy po

## 2016-04-22 NOTE — Progress Notes (Signed)
Patient advised to increase potassium to 46meq daily and rx has been sent

## 2016-04-28 ENCOUNTER — Telehealth: Payer: Self-pay | Admitting: Internal Medicine

## 2016-04-28 NOTE — Telephone Encounter (Signed)
Pt was prescribed Eliquis and is on coum and she shouldn't take the eliquis until her INR is less than 2.  Normally going from Warfarin to eliquis, normally cant start eliquis until INR is less then 2. Wants to know what to do.  Please call pharmacy back tomorrow they close at 1:30 today .  Marvetta Gibbons [phone]

## 2016-04-28 NOTE — Telephone Encounter (Signed)
Routing to dr plotnikov, please advise, thanks 

## 2016-04-28 NOTE — Telephone Encounter (Signed)
Stop coumadin. Check INR in 2 days and start Eliquis if INR <2 Thx

## 2016-04-29 NOTE — Telephone Encounter (Signed)
Patient advised of dr plotnikovs note/instructions, she has made appt with cindy/coumadin clinic to have INR checked on Thursday 4/12 at 1400----we will advise what to do about eliquis at that visit

## 2016-05-01 ENCOUNTER — Ambulatory Visit (INDEPENDENT_AMBULATORY_CARE_PROVIDER_SITE_OTHER): Payer: Medicare Other | Admitting: General Practice

## 2016-05-01 DIAGNOSIS — Z5181 Encounter for therapeutic drug level monitoring: Secondary | ICD-10-CM | POA: Diagnosis not present

## 2016-05-01 DIAGNOSIS — I4892 Unspecified atrial flutter: Secondary | ICD-10-CM

## 2016-05-01 LAB — POCT INR: INR: 1.7

## 2016-05-01 NOTE — Progress Notes (Signed)
I have reviewed and agree with the plan. 

## 2016-05-01 NOTE — Patient Instructions (Signed)
Pre visit review using our clinic review tool, if applicable. No additional management support is needed unless otherwise documented below in the visit note. 

## 2016-06-04 ENCOUNTER — Other Ambulatory Visit: Payer: Self-pay | Admitting: *Deleted

## 2016-06-04 MED ORDER — ATORVASTATIN CALCIUM 10 MG PO TABS: 10.0000 mg | ORAL_TABLET | Freq: Every evening | ORAL | 1 refills | Status: DC

## 2016-06-09 ENCOUNTER — Telehealth: Payer: Self-pay | Admitting: Internal Medicine

## 2016-06-09 NOTE — Telephone Encounter (Signed)
Pt called stating that she has been having a lot of back pain. She is wanting to try a lidocaine patch. The pharmacy recommended that she contact Dr Alain Marion to make sure it would not interferr with any of her other medications. Please advise. A message can be left on the pts home number [phone]).

## 2016-06-09 NOTE — Telephone Encounter (Signed)
It is OK to use Thx

## 2016-06-09 NOTE — Telephone Encounter (Signed)
Please advise 

## 2016-06-10 NOTE — Telephone Encounter (Signed)
Pt.notified

## 2016-06-12 ENCOUNTER — Telehealth: Payer: Self-pay | Admitting: Internal Medicine

## 2016-06-12 NOTE — Telephone Encounter (Signed)
Patient Name: [name]  DOB: August 19, 1925    Initial Comment Caller states has been constipated for a couple of wks, pain on her right side and is wanting to know what to else she can take.    Nurse Assessment  Nurse: Christel Mormon, RN, Levada Dy Date/Time Eilene Ghazi Time): 06/12/2016 3:52:24 PM  Confirm and document reason for call. If symptomatic, describe symptoms. ---Caller has not had a normal BM in about a week. She passed a "ball" yesterday. She states she took Miralax last night- no results yet and prune juice. She states the constipation is on/off. She states she is having pain in the right lower quad close to the hip.  Does the patient have any new or worsening symptoms? ---Yes  Will a triage be completed? ---Yes  Related visit to physician within the last 2 weeks? ---No  Does the PT have any chronic conditions? (i.e. diabetes, asthma, etc.) ---Yes  List chronic conditions. ---Hx of diverticulis and colon perf when she was in hospital for broken leg, atrial flutter, HTN.  Is this a behavioral health or substance abuse call? ---No     Guidelines    Guideline Title Affirmed Question Affirmed Notes  Constipation [1] Constant abdominal pain AND [2] present > 2 hours    Final Disposition User   See Physician within 4 Hours (or PCP triage) Christel Mormon, RN, [place]    Referrals  [facility]   Disagree/Comply: Comply

## 2016-06-13 ENCOUNTER — Encounter (HOSPITAL_COMMUNITY): Payer: Self-pay | Admitting: Emergency Medicine

## 2016-06-13 ENCOUNTER — Emergency Department (HOSPITAL_COMMUNITY)
Admission: EM | Admit: 2016-06-13 | Discharge: 2016-06-13 | Disposition: A | Discharge status: Home / Self Care | Payer: Medicare Other | Attending: Emergency Medicine | Admitting: Emergency Medicine

## 2016-06-13 ENCOUNTER — Emergency Department (HOSPITAL_COMMUNITY): Payer: Medicare Other

## 2016-06-13 DIAGNOSIS — K5901 Slow transit constipation: Secondary | ICD-10-CM

## 2016-06-13 DIAGNOSIS — Z79899 Other long term (current) drug therapy: Secondary | ICD-10-CM | POA: Insufficient documentation

## 2016-06-13 DIAGNOSIS — Z96641 Presence of right artificial hip joint: Secondary | ICD-10-CM | POA: Diagnosis not present

## 2016-06-13 DIAGNOSIS — K5641 Fecal impaction: Secondary | ICD-10-CM | POA: Diagnosis not present

## 2016-06-13 DIAGNOSIS — R109 Unspecified abdominal pain: Secondary | ICD-10-CM | POA: Diagnosis not present

## 2016-06-13 DIAGNOSIS — Z853 Personal history of malignant neoplasm of breast: Secondary | ICD-10-CM | POA: Insufficient documentation

## 2016-06-13 DIAGNOSIS — E039 Hypothyroidism, unspecified: Secondary | ICD-10-CM | POA: Diagnosis not present

## 2016-06-13 DIAGNOSIS — Z7901 Long term (current) use of anticoagulants: Secondary | ICD-10-CM | POA: Insufficient documentation

## 2016-06-13 DIAGNOSIS — R1011 Right upper quadrant pain: Secondary | ICD-10-CM | POA: Diagnosis present

## 2016-06-13 DIAGNOSIS — I10 Essential (primary) hypertension: Secondary | ICD-10-CM | POA: Diagnosis not present

## 2016-06-13 DIAGNOSIS — K5732 Diverticulitis of large intestine without perforation or abscess without bleeding: Secondary | ICD-10-CM | POA: Diagnosis not present

## 2016-06-13 LAB — URINALYSIS, ROUTINE W REFLEX MICROSCOPIC
Bilirubin Urine: NEGATIVE
Glucose, UA: NEGATIVE mg/dL
Hgb urine dipstick: NEGATIVE
Ketones, ur: NEGATIVE mg/dL
Nitrite: POSITIVE — AB
Protein, ur: NEGATIVE mg/dL
Specific Gravity, Urine: 1.005 (ref 1.005–1.030)
pH: 8 (ref 5.0–8.0)

## 2016-06-13 LAB — COMPREHENSIVE METABOLIC PANEL
ALT: 22 U/L (ref 14–54)
AST: 25 U/L (ref 15–41)
Albumin: 3.7 g/dL (ref 3.5–5.0)
Alkaline Phosphatase: 54 U/L (ref 38–126)
Anion gap: 7 (ref 5–15)
BUN: 12 mg/dL (ref 6–20)
CO2: 32 mmol/L (ref 22–32)
Calcium: 9.3 mg/dL (ref 8.9–10.3)
Chloride: 102 mmol/L (ref 101–111)
Creatinine, Ser: 0.96 mg/dL (ref 0.44–1.00)
GFR calc Af Amer: 59 mL/min — ABNORMAL LOW (ref >60)
GFR calc non Af Amer: 51 mL/min — ABNORMAL LOW (ref >60)
Glucose, Bld: 106 mg/dL — ABNORMAL HIGH (ref 65–99)
Potassium: 3.5 mmol/L (ref 3.5–5.1)
Sodium: 141 mmol/L (ref 135–145)
Total Bilirubin: 1.4 mg/dL — ABNORMAL HIGH (ref 0.3–1.2)
Total Protein: 7.3 g/dL (ref 6.5–8.1)

## 2016-06-13 LAB — CBC WITH DIFFERENTIAL/PLATELET
Basophils Absolute: 0 10*3/uL (ref 0.0–0.1)
Basophils Relative: 0 %
Eosinophils Absolute: 0.1 10*3/uL (ref 0.0–0.7)
Eosinophils Relative: 2 %
HCT: 40.9 % (ref 36.0–46.0)
Hemoglobin: 13.5 g/dL (ref 12.0–15.0)
Lymphocytes Relative: 28 %
Lymphs Abs: 1.6 10*3/uL (ref 0.7–4.0)
MCH: 34.4 pg — ABNORMAL HIGH (ref 26.0–34.0)
MCHC: 33 g/dL (ref 30.0–36.0)
MCV: 104.3 fL — ABNORMAL HIGH (ref 78.0–100.0)
Monocytes Absolute: 0.5 10*3/uL (ref 0.1–1.0)
Monocytes Relative: 8 %
Neutro Abs: 3.4 10*3/uL (ref 1.7–7.7)
Neutrophils Relative %: 62 %
Platelets: 212 10*3/uL (ref 150–400)
RBC: 3.92 MIL/uL (ref 3.87–5.11)
RDW: 12.6 % (ref 11.5–15.5)
WBC: 5.6 10*3/uL (ref 4.0–10.5)

## 2016-06-13 MED ORDER — PEG 3350-KCL-NABCB-NACL-NASULF 240 G PO SOLR: 4000.0000 mL | Freq: Once | ORAL | 0 refills | Status: AC

## 2016-06-13 MED ORDER — CALCIUM POLYCARBOPHIL 625 MG PO TABS: 625.0000 mg | ORAL_TABLET | Freq: Every day | ORAL | 0 refills | Status: DC

## 2016-06-13 MED ORDER — SODIUM CHLORIDE 0.9 % IV BOLUS (SEPSIS)
500.0000 mL | Freq: Once | INTRAVENOUS | Status: AC
  Administered 2016-06-13: 500 mL via INTRAVENOUS

## 2016-06-13 MED ORDER — MAGNESIUM CITRATE PO SOLN: 1.0000 | Freq: Once | ORAL | 0 refills | Status: DC

## 2016-06-13 MED ORDER — BISACODYL 10 MG RE SUPP: 10.0000 mg | RECTAL | 0 refills | Status: DC | PRN

## 2016-06-13 MED ORDER — DOCUSATE SODIUM 100 MG PO CAPS: 100.0000 mg | ORAL_CAPSULE | Freq: Two times a day (BID) | ORAL | 0 refills | Status: DC

## 2016-06-13 NOTE — ED Triage Notes (Signed)
Patient from independent living facility, patient has not had a BM in 3-4 days.  Patient seen for the same last week, she did take Mag Citrate with some results.  Patient states she has had this all her life.  She is having pain in right upper quadrant, no nausea or vomiting at this time.  Patient is CAOx4.

## 2016-06-13 NOTE — ED Notes (Signed)
Patient had one medium BM and has relieved some of her pain.

## 2016-06-13 NOTE — Discharge Instructions (Signed)
Follow up with your primary doctor. Your scan shows large amount of stool. Increase your fluid intake. You can also drink warm prune juice. Follow up with your primary doctor as soon as possible.

## 2016-06-15 LAB — URINE CULTURE: Culture: >=100000 — AB

## 2016-06-16 ENCOUNTER — Telehealth: Payer: Self-pay | Admitting: Emergency Medicine

## 2016-06-16 NOTE — Progress Notes (Signed)
ED Antimicrobial Stewardship Positive Culture Follow Up   [name] is an 83 y.o. female who presented to [facility] on 06/13/2016 with a chief complaint of  Chief Complaint  Patient presents with  . Constipation    Recent Results (from the past 720 hour(s))  Urine culture     Status: Abnormal   Collection Time: 06/13/16  8:14 AM  Result Value Ref Range Status   Specimen Description URINE, RANDOM  Final   Special Requests NONE  Final   Culture >=100,000 COLONIES/mL ESCHERICHIA COLI (A)  Final   Report Status 06/15/2016 FINAL  Final   Organism ID, Bacteria ESCHERICHIA COLI (A)  Final      Susceptibility   Escherichia coli - MIC*    AMPICILLIN 8 SENSITIVE Sensitive     CEFAZOLIN <=4 SENSITIVE Sensitive     CEFTRIAXONE <=1 SENSITIVE Sensitive     CIPROFLOXACIN >=4 RESISTANT Resistant     GENTAMICIN <=1 SENSITIVE Sensitive     IMIPENEM <=0.25 SENSITIVE Sensitive     NITROFURANTOIN <=16 SENSITIVE Sensitive     TRIMETH/SULFA <=20 SENSITIVE Sensitive     AMPICILLIN/SULBACTAM 4 SENSITIVE Sensitive     PIP/TAZO <=4 SENSITIVE Sensitive     Extended ESBL NEGATIVE Sensitive     * >=100,000 COLONIES/mL ESCHERICHIA COLI    []  Treated with N/A, organism resistant to prescribed antimicrobial [x]  Patient discharged originally without antimicrobial agent and treatment is now indicated  New antibiotic prescription: No antibiotic treatment for now. Please have patient follow-up with primary care for a symptom recheck and possibly rechecking the urine  ED Provider: Harlene Ramus, PA   Amberlynn Tempesta, Rande Lawman 06/16/2016, 9:28 AM Clinical Pharmacist Phone# [phone]

## 2016-06-16 NOTE — Telephone Encounter (Signed)
Post ED Visit - Positive Culture Follow-up  Culture report reviewed by antimicrobial stewardship pharmacist:  []  Elenor Quinones, Pharm.D. []  Heide Guile, Pharm.D., BCPS AQ-ID []  Parks Neptune, Pharm.D., BCPS []  Alycia Rossetti, Pharm.D., BCPS []  [place], Pharm.D., BCPS, AAHIVP []  Legrand Como, Pharm.D., BCPS, AAHIVP [x]  Salome Arnt, PharmD, BCPS []  Dimitri Ped, PharmD, BCPS []  Vincenza Hews, PharmD, BCPS  Positive urine culture Treated with doxycycline, organism sensitive to the same and no further patient follow-up is required at this time.  Hazle Nordmann 06/16/2016, 12:15 PM

## 2016-06-17 ENCOUNTER — Telehealth: Payer: Self-pay

## 2016-06-17 NOTE — Telephone Encounter (Signed)
Pt called after hours on 06/15/2016:   Caller states that she drank a lot of water and is wondering how much water she should be drinking. Caller states that last time she had a bowel movement was a week ago. Caller stats she used the bathroom today and had a very little BM. Pain on rt side 5/10, hard for her to get up and down.   Recent positive culture   Please advise.

## 2016-06-17 NOTE — Telephone Encounter (Signed)
Use Senakot 2 tablets and Miralax 1-4 scoops together prn Thx

## 2016-06-18 ENCOUNTER — Ambulatory Visit (INDEPENDENT_AMBULATORY_CARE_PROVIDER_SITE_OTHER)
Admission: RE | Admit: 2016-06-18 | Discharge: 2016-06-18 | Disposition: A | Discharge status: Home / Self Care | Payer: Medicare Other | Source: Ambulatory Visit | Attending: Nurse Practitioner | Admitting: Nurse Practitioner

## 2016-06-18 ENCOUNTER — Telehealth: Payer: Self-pay | Admitting: Internal Medicine

## 2016-06-18 ENCOUNTER — Encounter: Payer: Self-pay | Admitting: Nurse Practitioner

## 2016-06-18 ENCOUNTER — Ambulatory Visit (INDEPENDENT_AMBULATORY_CARE_PROVIDER_SITE_OTHER): Payer: Medicare Other | Admitting: Nurse Practitioner

## 2016-06-18 VITALS — BP 140/82 | HR 75 | Temp 99.1°F

## 2016-06-18 DIAGNOSIS — N3000 Acute cystitis without hematuria: Secondary | ICD-10-CM | POA: Diagnosis not present

## 2016-06-18 DIAGNOSIS — K5901 Slow transit constipation: Secondary | ICD-10-CM

## 2016-06-18 DIAGNOSIS — R1030 Lower abdominal pain, unspecified: Secondary | ICD-10-CM | POA: Diagnosis not present

## 2016-06-18 DIAGNOSIS — K5900 Constipation, unspecified: Secondary | ICD-10-CM | POA: Diagnosis not present

## 2016-06-18 MED ORDER — POLYETHYLENE GLYCOL 3350 17 G PO PACK: 17.0000 g | PACK | Freq: Every day | ORAL | 0 refills | Status: DC

## 2016-06-18 MED ORDER — SULFAMETHOXAZOLE-TRIMETHOPRIM 800-160 MG PO TABS: 1.0000 | ORAL_TABLET | Freq: Two times a day (BID) | ORAL | 0 refills | Status: DC

## 2016-06-18 NOTE — ED Provider Notes (Signed)
[facility] DEPT Provider Note   CSN: [id number] Arrival date & time: 06/13/16  0536     History   Chief Complaint Chief Complaint  Patient presents with  . Constipation    HPI [name] is a 83 y.o. female.  HPI Patient presents to the emergency department with constipation.  It seems worse over the last 5 days.  Patient states she has chronic constipation but seems worse over the last little while.  She states that she did take some mag citrate last weekend, which seemed to improve her symptoms, but has gotten worse since then.  The patient states that nothing seems make the condition better.  She does have some pain in the right flank region.  The patient states that she did not take any other medications prior to arrivalThe patient denies chest pain, shortness of breath, headache,blurred vision, neck pain, fever, cough, weakness, numbness, dizziness, anorexia, edema,nausea, vomiting, diarrhea, rash, back pain, dysuria, hematemesis, bloody stool, near syncope, or syncope. Past Medical History:  Diagnosis Date  . Anticoagulated on Coumadin   . Anxiety    denies  . Bilateral edema of lower extremity   . Cancer ([place])    left breast  . Chronic constipation   . Diverticulosis of colon   . GERD (gastroesophageal reflux disease)   . H/O hiatal hernia   . History of cellulitis    LEFT LOWER LEG --  SEPT 2015  . History of colitis    DX  2003  WITH ISCHEMIC COLITIS  . History of diverticulitis of colon    perferated diverticulitis w/ abscess 08-25-2013 drain placed --  resolved without surgical intervention  . History of GI bleed    2011--- UPPER SECONARY GASTRIC ULCER FROM NSAID USE  . History of Helicobacter pylori infection    2011  . History of ulcer of lower limb    2012   RIGHT LOWER EXTREMITIY--  STATSIS ULCER  . Hyperlipidemia   . Hypertension   . Hypothyroidism   . Iron deficiency anemia 2011   elevated MCV  . LBP (low back pain)    severe lumbar  spondylosis s/p L3/L4,L4/L5 fusion 12/10  . Lumbar spondylolysis   . OA (osteoarthritis)    "hands" (08/25/2013)  . Open wound of left lower leg   . Osteopenia   . Persistent atrial fibrillation ([place])    CARDIOLOGIST-   DR MCALANY  . RBBB    at fib  . Urinary frequency     Patient Active Problem List   Diagnosis Date Noted  . Breast cancer of upper-outer quadrant of left female breast ([place]) 08/10/2015  . Breast mass in female 07/25/2015  . Left breast mass 07/05/2015  . CAP (community acquired pneumonia) 03/06/2015  . Incontinence in female 07/05/2014  . Edema 01/17/2014  . Morbilliform rash 09/06/2013  . Sepsis ([place]) 08/25/2013  . Metabolic acidosis 48/88/9169  . Diverticulitis of colon with perforation 08/25/2013  . Unspecified constipation 06/22/2013  . Open tibial fracture 06/17/2013  . Encounter for therapeutic drug monitoring 02/16/2013  . Well adult exam 07/21/2012  . Paresthesia 01/28/2012  . Chronic venous insufficiency 11/11/2010  . Actinic keratoses 05/30/2010  . Long term current use of anticoagulant 03/12/2010  . PEPTIC ULCER DISEASE 11/22/2009  . Iron deficiency anemia 10/31/2009  . HIP PAIN 10/31/2009  . Hypothyroidism 10/25/2009  . Disorder resulting from impaired renal function 03/19/2009  . INSOMNIA, PERSISTENT 11/07/2008  . Low back pain radiating to both legs 09/04/2008  . Atrial  flutter ([place]) 05/31/2008  . Essential hypertension 11/30/2006  . OSTEOARTHRITIS 11/30/2006  . Disorder of bone and cartilage 11/30/2006    Past Surgical History:  Procedure Laterality Date  . APPLICATION OF A-CELL OF EXTREMITY Left 06/21/2013   Procedure: APPLICATION OF A-CELL OF EXTREMITY;  Surgeon: Theodoro Kos, DO;  Location: [facility];  Service: Plastics;  Laterality: Left;  . APPLICATION OF A-CELL OF EXTREMITY Left 08/22/2013   Procedure: APPLICATION OF A-CELL/VAC TO LOWER LEFT LEG WOUND;  Surgeon: Theodoro Kos, DO;  Location: [facility];  Service: Plastics;   Laterality: Left;  . APPLICATION OF A-CELL OF EXTREMITY Left 02/06/2014   Procedure: WITH PLACEMENT OF A -CELL ;  Surgeon: Theodoro Kos, DO;  Location: [facility];  Service: Plastics;  Laterality: Left;  . APPLICATION OF A-CELL OF EXTREMITY Left 08/09/2014   Procedure: PLACEMENT OF A CELL;  Surgeon: Theodoro Kos, DO;  Location: [facility];  Service: Plastics;  Laterality: Left;  . APPLICATION OF WOUND VAC Left 06/16/2013   Procedure: APPLICATION OF WOUND VAC;  Surgeon: Meredith Pel, MD;  Location: [facility];  Service: Orthopedics;  Laterality: Left;  . APPLICATION OF WOUND VAC Left 06/21/2013   Procedure: APPLICATION OF WOUND VAC;  Surgeon: Theodoro Kos, DO;  Location: [facility];  Service: Plastics;  Laterality: Left;  . BIOPSY BREAST  07/25/2015   LEFT RADIOACTIVE SEED GUIDED EXCISIONAL BREAST BIOPSY (Left) as a surgical intervention  . EXTERNAL FIXATION LEG Left 06/16/2013   Procedure: EXTERNAL FIXATION LEG;  Surgeon: Meredith Pel, MD;  Location: [facility];  Service: Orthopedics;  Laterality: Left;  . EXTERNAL FIXATION REMOVAL Left 06/21/2013   Procedure: REMOVAL EXTERNAL FIXATION LEG;  Surgeon: Meredith Pel, MD;  Location: [facility];  Service: Orthopedics;  Laterality: Left;  . EYE SURGERY Bilateral    cataract removal  . HARDWARE REMOVAL Left 05/23/2014   Procedure: HARDWARE REMOVAL;  Surgeon: Meredith Pel, MD;  Location: [facility];  Service: Orthopedics;  Laterality: Left;  REMOVAL OF HARDWARE LEFT TIBIA  . I&D EXTREMITY Left 06/16/2013   Procedure: IRRIGATION AND DEBRIDEMENT EXTREMITY;  Surgeon: Meredith Pel, MD;  Location: [facility];  Service: Orthopedics;  Laterality: Left;  . I&D EXTREMITY Left 02/06/2014   Procedure: IRRIGATION AND DEBRIDEMENT LEFT LEG WOUND ;  Surgeon: Theodoro Kos, DO;  Location: [facility];  Service: Plastics;  Laterality: Left;  . I&D EXTREMITY Left 08/09/2014   Procedure: IRRIGATION AND DEBRIDEMENT  OF LEFT LOWER LEG;  Surgeon: Theodoro Kos, DO;  Location: [facility];  Service: Plastics;  Laterality: Left;  . LUMBAR FUSION  01-02-2009   L3-L5  . RADIOACTIVE SEED GUIDED EXCISIONAL BREAST BIOPSY Left 07/25/2015   Procedure: LEFT RADIOACTIVE SEED GUIDED EXCISIONAL BREAST BIOPSY;  Surgeon: Rolm Bookbinder, MD;  Location: [facility];  Service: General;  Laterality: Left;  . RE-EXCISION OF BREAST LUMPECTOMY Left 09/05/2015   Procedure: RE-EXCISION OF LEFT BREAST LUMPECTOMY;  Surgeon: Rolm Bookbinder, MD;  Location: [facility];  Service: General;  Laterality: Left;  . SHOULDER OPEN ROTATOR CUFF REPAIR Left 1997  . TIBIA IM NAIL INSERTION Left 06/21/2013   Procedure: INTRAMEDULLARY (IM) NAIL TIBIAL;  Surgeon: Meredith Pel, MD;  Location: [facility];  Service: Orthopedics;  Laterality: Left;  . TONSILLECTOMY  AS CHILD  . TOTAL HIP ARTHROPLASTY Right 06-07-2010  . TRANSTHORACIC ECHOCARDIOGRAM  09-11-2010   DR MCALHANY   LVSF  55-60%/  MILD MV CALCIFICATION WITH NO STENOSIS/  MILD MR & TR /  MODERATE LAE/  MODERATE TO SEVERE RAE  . UPPER GI ENDOSCOPY  03-29-2010    OB History    No data available       Home Medications    Prior to Admission medications   Medication Sig Start Date End Date Taking? Authorizing Provider  acetaminophen (TYLENOL) 500 MG tablet Take 500 mg by mouth daily as needed for mild pain.    Yes [provider]  anastrozole (ARIMIDEX) 1 MG tablet Take 1 tablet (1 mg total) by mouth daily. 03/10/16  Yes Magrinat, Virgie Dad, MD  apixaban (ELIQUIS) 2.5 MG TABS tablet Take 1 tablet (2.5 mg total) by mouth 2 (two) times daily. 04/21/16  Yes [name], [name], MD  atorvastatin (LIPITOR) 10 MG tablet Take 1 tablet (10 mg total) by mouth every evening. 06/04/16  Yes [name], [name], MD  B Complex-C (B-COMPLEX WITH VITAMIN C) tablet Take 1 tablet by mouth daily.   Yes [provider]  busPIRone (BUSPAR) 7.5 MG tablet Take 1 tablet (7.5 mg total) by mouth 2 (two) times daily. Yearly physical due in June must see  MD for refills 01/28/16  Yes [name], [name], MD  Cholecalciferol (VITAMIN D3) 1000 UNITS tablet Take 1,000 Units by mouth daily.     Yes [provider]  diltiazem (CARDIZEM CD) 120 MG 24 hr capsule TAKE 1 CAPSULE (120 MG TOTAL) BY MOUTH DAILY. 10/09/15  Yes [name], [name], MD  doxycycline (VIBRA-TABS) 100 MG tablet Take 100 mg by mouth 2 (two) times daily.   Yes [provider]  Ferrous Sulfate (IRON) 325 (65 FE) MG TABS Take 1 tablet by mouth daily.   Yes [provider]  furosemide (LASIX) 40 MG tablet Take 1 tablet (40 mg total) by mouth as directed. 1 po on Mon-Wed-Fri in am 01/28/16  Yes [name], [name], MD  levothyroxine (SYNTHROID, LEVOTHROID) 25 MCG tablet TAKE 1 TABLET BY MOUTH ONCE DAILY FOR THYROID 07/16/15  Yes [name], [name], MD  metoprolol succinate (TOPROL-XL) 50 MG 24 hr tablet Take 1 tablet (50 mg total) by mouth 2 (two) times daily. Take with or immediately following a meal. 10/09/15  Yes [name], [name], MD  Multiple Vitamins-Minerals (MULTIVITAMIN WITH MINERALS) tablet Take 1 tablet by mouth daily.   Yes [provider]  omeprazole (PRILOSEC) 20 MG capsule Take 20 mg by mouth daily as needed (acid reflux).    Yes [provider]  oxybutynin (DITROPAN) 5 MG tablet Take 1 tablet (5 mg total) by mouth 2 (two) times daily. Yearly physical due in June must see MD for refills 01/28/16  Yes [name], [name], MD  potassium chloride SA (K-DUR,KLOR-CON) 20 MEQ tablet Take 1 tablet (20 mEq total) by mouth daily. 04/21/16  Yes [name], [name], MD  temazepam (RESTORIL) 30 MG capsule Take 1 capsule (30 mg total) by mouth at bedtime. 01/25/16  Yes [name], [name], MD  traMADol (ULTRAM) 50 MG tablet Take 1-2 tablets (50-100 mg total) by mouth every 6 (six) hours as needed. Patient taking differently: Take 50-100 mg by mouth every 6 (six) hours as needed for moderate pain.  09/05/15  Yes Rolm Bookbinder, MD  vitamin C  (ASCORBIC ACID) 500 MG tablet Take 500 mg by mouth daily.   Yes [provider]  zinc sulfate (ZINC-220) 220 (50 Zn) MG capsule Take 220 mg by mouth daily.   Yes [provider]  bisacodyl (DULCOLAX) 10 MG suppository Place 1 suppository (10 mg total) rectally  as needed for moderate constipation. 06/13/16   Krishawn Vanderweele, Harrell Gave, PA-C  docusate sodium (COLACE) 100 MG capsule Take 1 capsule (100 mg total) by mouth every 12 (twelve) hours. 06/13/16   Lyndsy Gilberto, Harrell Gave, PA-C  polycarbophil (FIBERCON) 625 MG tablet Take 1 tablet (625 mg total) by mouth daily. 06/13/16   Tanique Matney, Harrell Gave, PA-C  polyethylene glycol (MIRALAX / GLYCOLAX) packet Take 17 g by mouth daily. 06/18/16   Nche, Charlene Brooke, NP  sulfamethoxazole-trimethoprim (BACTRIM DS,SEPTRA DS) 800-160 MG tablet Take 1 tablet by mouth 2 (two) times daily. 06/18/16   Nche, Charlene Brooke, NP    Family History Family History  Problem Relation Age of Onset  . Cancer Father   . Hypertension Other     Social History Social History  Substance Use Topics  . Smoking status: Never Smoker  . Smokeless tobacco: Never Used  . Alcohol use No     Comment: 1 glass occ wine 08/03/14- none in a year     Allergies   Zosyn [piperacillin sod-tazobactam so]; Atenolol; Clarithromycin; Codeine sulfate; Levaquin [levofloxacin]; Macrodantin; Oxycodone-acetaminophen; and Percocet [oxycodone-acetaminophen]   Review of Systems Review of Systems  All other systems negative except as documented in the HPI. All pertinent positives and negatives as reviewed in the HPI. Physical Exam Updated Vital Signs BP (!) 154/99   Pulse 73   Temp 98.4 F (36.9 C) (Oral)   Resp 16   SpO2 97%   Physical Exam  Constitutional: She is oriented to person, place, and time. She appears well-developed and well-nourished. No distress.  HENT:  Head: Normocephalic and atraumatic.  Mouth/Throat: Oropharynx is clear and moist.  Eyes: Pupils are equal, round,  and reactive to light.  Neck: Normal range of motion. Neck supple.  Cardiovascular: Normal rate, regular rhythm and normal heart sounds.  Exam reveals no gallop and no friction rub.   No murmur heard. Pulmonary/Chest: Effort normal and breath sounds normal. No respiratory distress. She has no wheezes.  Abdominal: Soft. Bowel sounds are normal. She exhibits no distension and no mass. There is tenderness. There is no rebound and no guarding.  Neurological: She is alert and oriented to person, place, and time. She exhibits normal muscle tone. Coordination normal.  Skin: Skin is warm and dry. Capillary refill takes less than 2 seconds. No rash noted. No erythema.  Psychiatric: She has a normal mood and affect. Her behavior is normal.  Nursing note and vitals reviewed.    ED Treatments / Results  Labs (all labs ordered are listed, but only abnormal results are displayed) Labs Reviewed  URINE CULTURE - Abnormal; Notable for the following:       Result Value   Culture >=100,000 COLONIES/mL ESCHERICHIA COLI (*)    Organism ID, Bacteria ESCHERICHIA COLI (*)    All other components within normal limits  COMPREHENSIVE METABOLIC PANEL - Abnormal; Notable for the following:    Glucose, Bld 106 (*)    Total Bilirubin 1.4 (*)    GFR calc non Af Amer 51 (*)    GFR calc Af Amer 59 (*)    All other components within normal limits  CBC WITH DIFFERENTIAL/PLATELET - Abnormal; Notable for the following:    MCV 104.3 (*)    MCH 34.4 (*)    All other components within normal limits  URINALYSIS, ROUTINE W REFLEX MICROSCOPIC - Abnormal; Notable for the following:    Nitrite POSITIVE (*)    Leukocytes, UA TRACE (*)    Bacteria, UA FEW (*)  Squamous Epithelial / LPF 0-5 (*)    All other components within normal limits    EKG  EKG Interpretation None       Radiology Dg Abd 1 View  Result Date: 06/18/2016 CLINICAL DATA:  Lower abdominal pain.  Constipation. EXAM: ABDOMEN - 1 VIEW COMPARISON:   Scout image for CT scan of the abdomen dated 06/13/2016 FINDINGS: The bowel gas pattern is normal. No excessive stool in the colon. No fecal impaction. No acute bone abnormality.  Slight lumbar scoliosis. Aortic atherosclerosis. Evidence of previous lower lumbar fusions. Right total hip prosthesis. IMPRESSION: No acute abnormalities. Electronically Signed   By: Lorriane Shire M.D.   On: 06/18/2016 15:14    Procedures Procedures (including critical care time)  Medications Ordered in ED Medications  sodium chloride 0.9 % bolus 500 mL (0 mLs Intravenous Stopped 06/13/16 1144)     Initial Impression / Assessment and Plan / ED Course  I have reviewed the triage vital signs and the nursing notes.  Pertinent labs & imaging results that were available during my care of the patient were reviewed by me and considered in my medical decision making (see chart for details).     Patient be treated for constipation.  Told to follow up with her primary care doctor.  Patient also be treated for her UTI.  Is advised to return here for any worsening in her condition.  She has a CT scan which shows constipation.  Final Clinical Impressions(s) / ED Diagnoses   Final diagnoses:  Flank pain  Slow transit constipation  Fecal impaction in rectum [facility])    New Prescriptions Discharge Medication List as of 06/13/2016 12:05 PM    START taking these medications   Details  bisacodyl (DULCOLAX) 10 MG suppository Place 1 suppository (10 mg total) rectally as needed for moderate constipation., Starting Fri 06/13/2016, Print    docusate sodium (COLACE) 100 MG capsule Take 1 capsule (100 mg total) by mouth every 12 (twelve) hours., Starting Fri 06/13/2016, Print    polycarbophil (FIBERCON) 625 MG tablet Take 1 tablet (625 mg total) by mouth daily., Starting Fri 06/13/2016, Print    polyethylene glycol (COLYTE) 240 g solution Take 4,000 mLs by mouth once., Starting Fri 06/13/2016, Print         Marcellino Fidalgo, [place],  PA-C 06/18/16 1625    Charlesetta Shanks, MD 06/20/16 1745

## 2016-06-18 NOTE — Patient Instructions (Signed)
Please go to basement for ABD x-ray.  Hold doxycycline while taking bactrim for UTI.  Take bactrim with food.  Sulfamethoxazole; Trimethoprim, SMX-TMP tablets What is this medicine? SULFAMETHOXAZOLE; TRIMETHOPRIM or SMX-TMP (suhl fuh meth OK suh zohl; trye METH oh prim) is a combination of a sulfonamide antibiotic and a second antibiotic, trimethoprim. It is used to treat or prevent certain kinds of bacterial infections. It will not work for colds, flu, or other viral infections. This medicine may be used for other purposes; ask your health care provider or pharmacist if you have questions. COMMON BRAND NAME(S): Bacter-Aid DS, Bactrim, Bactrim DS, Septra, Septra DS What should I tell my health care provider before I take this medicine? They need to know if you have any of these conditions: -anemia -asthma -being treated with anticonvulsants -if you frequently drink alcohol containing drinks -kidney disease -liver disease -low level of folic acid or [id number] dehydrogenase -poor nutrition or malabsorption -porphyria -severe allergies -thyroid disorder -an unusual or allergic reaction to sulfamethoxazole, trimethoprim, sulfa drugs, other medicines, foods, dyes, or preservatives -pregnant or trying to get pregnant -breast-feeding How should I use this medicine? Take this medicine by mouth with a full glass of water. Follow the directions on the prescription label. Take your medicine at regular intervals. Do not take it more often than directed. Do not skip doses or stop your medicine early. Talk to your pediatrician regarding the use of this medicine in children. Special care may be needed. This medicine has been used in children as young as 68 months of age. Overdosage: If you think you have taken too much of this medicine contact a poison control center or emergency room at once. NOTE: This medicine is only for you. Do not share this medicine with others. What if I miss a  dose? If you miss a dose, take it as soon as you can. If it is almost time for your next dose, take only that dose. Do not take double or extra doses. What may interact with this medicine? Do not take this medicine with any of the following medications: -aminobenzoate potassium -dofetilide -metronidazole This medicine may also interact with the following medications: -ACE inhibitors like benazepril, enalapril, lisinopril, and ramipril -birth control pills -cyclosporine -digoxin -diuretics -indomethacin -medicines for diabetes -methenamine -methotrexate -phenytoin -potassium supplements -pyrimethamine -sulfinpyrazone -tricyclic antidepressants -warfarin This list may not describe all possible interactions. Give your health care provider a list of all the medicines, herbs, non-prescription drugs, or dietary supplements you use. Also tell them if you smoke, drink alcohol, or use illegal drugs. Some items may interact with your medicine. What should I watch for while using this medicine? Tell your doctor or health care professional if your symptoms do not improve. Drink several glasses of water a day to reduce the risk of kidney problems. Do not treat diarrhea with over the counter products. Contact your doctor if you have diarrhea that lasts more than 2 days or if it is severe and watery. This medicine can make you more sensitive to the sun. Keep out of the sun. If you cannot avoid being in the sun, wear protective clothing and use a sunscreen. Do not use sun lamps or tanning beds/booths. What side effects may I notice from receiving this medicine? Side effects that you should report to your doctor or health care professional as soon as possible: -allergic reactions like skin rash or hives, swelling of the face, lips, or tongue -breathing problems -fever or chills, sore throat -irregular heartbeat, chest  pain -joint or muscle pain -pain or difficulty passing urine -red pinpoint spots  on skin -redness, blistering, peeling or loosening of the skin, including inside the mouth -unusual bleeding or bruising -unusually weak or tired -yellowing of the eyes or skin Side effects that usually do not require medical attention (report to your doctor or health care professional if they continue or are bothersome): -diarrhea -dizziness -headache -loss of appetite -nausea, vomiting -nervousness This list may not describe all possible side effects. Call your doctor for medical advice about side effects. You may report side effects to FDA at 1-800-FDA-1088. Where should I keep my medicine? Keep out of the reach of children. Store at room temperature between 20 to 25 degrees C (68 to 77 degrees F). Protect from light. Throw away any unused medicine after the expiration date. NOTE: This sheet is a summary. It may not cover all possible information. If you have questions about this medicine, talk to your doctor, pharmacist, or health care provider.  2018 Elsevier/Gold Standard (2012-08-13 14:38:26)

## 2016-06-18 NOTE — Telephone Encounter (Signed)
Patient Name: [name]  DOB: August 09, 1925    Initial Comment Caller states she was talking to [place] at the office, who told her to hold a medication but she did not write it down and cannot remember which medication to hold.    Nurse Assessment      Guidelines    Guideline Title Affirmed Question Affirmed Notes       Final Disposition User   Clinical Call [place], RN, Levada Dy    Comments  Pt seen today- she forgot which medication she was to hold- per EPIC Hold doxycycline while taking bactrim for UTI. She also had other questions about the Docusate Sodium- per what I could figure out reading the chart- she was to continue it. But the patient though she was not to take the medication she got on Friday. Instructed to call the office tomorrow for the additional laxative questions. She will hold the Doxycycline.

## 2016-06-18 NOTE — Progress Notes (Signed)
Subjective:  Patient ID: [name], female    DOB: 12-05-1925  Age: 83 y.o. MRN: [id number]  CC: Follow-up (ER FOLLOW UP/not better,pain on right side/last BM was friday/ son in law request to speak to you before you go into room A8 ??)  Abdominal Pain  This is a recurrent problem. The current episode started in the past 7 days. The onset quality is gradual. The problem occurs intermittently. The problem has been waxing and waning. The pain is located in the suprapubic region. The quality of the pain is colicky, dull and a sensation of fullness. The abdominal pain radiates to the back. Associated symptoms include constipation. Pertinent negatives include no anorexia, belching, diarrhea, dysuria, fever, flatus, frequency, headaches, hematochezia, melena, myalgias, nausea, vomiting or weight loss. The pain is aggravated by palpation and movement. The pain is relieved by bowel movements. She has tried nothing for the symptoms. Prior diagnostic workup includes CT scan. Her past medical history is significant for irritable bowel syndrome.    Outpatient Medications Prior to Visit  Medication Sig Dispense Refill  . acetaminophen (TYLENOL) 500 MG tablet Take 500 mg by mouth daily as needed for mild pain.     [name] anastrozole (ARIMIDEX) 1 MG tablet Take 1 tablet (1 mg total) by mouth daily. 90 tablet 3  . apixaban (ELIQUIS) 2.5 MG TABS tablet Take 1 tablet (2.5 mg total) by mouth 2 (two) times daily. 180 tablet 3  . atorvastatin (LIPITOR) 10 MG tablet Take 1 tablet (10 mg total) by mouth every evening. 90 tablet 1  . B Complex-C (B-COMPLEX WITH VITAMIN C) tablet Take 1 tablet by mouth daily.    . bisacodyl (DULCOLAX) 10 MG suppository Place 1 suppository (10 mg total) rectally as needed for moderate constipation. 12 suppository 0  . busPIRone (BUSPAR) 7.5 MG tablet Take 1 tablet (7.5 mg total) by mouth 2 (two) times daily. Yearly physical due in June must see MD for refills 180 tablet 1  .  Cholecalciferol (VITAMIN D3) 1000 UNITS tablet Take 1,000 Units by mouth daily.      [name] diltiazem (CARDIZEM CD) 120 MG 24 hr capsule TAKE 1 CAPSULE (120 MG TOTAL) BY MOUTH DAILY. 90 capsule 3  . docusate sodium (COLACE) 100 MG capsule Take 1 capsule (100 mg total) by mouth every 12 (twelve) hours. 60 capsule 0  . doxycycline (VIBRA-TABS) 100 MG tablet Take 100 mg by mouth 2 (two) times daily.    . Ferrous Sulfate (IRON) 325 (65 FE) MG TABS Take 1 tablet by mouth daily.    . furosemide (LASIX) 40 MG tablet Take 1 tablet (40 mg total) by mouth as directed. 1 po on Mon-Wed-Fri in am 90 tablet 1  . levothyroxine (SYNTHROID, LEVOTHROID) 25 MCG tablet TAKE 1 TABLET BY MOUTH ONCE DAILY FOR THYROID 90 tablet 3  . metoprolol succinate (TOPROL-XL) 50 MG 24 hr tablet Take 1 tablet (50 mg total) by mouth 2 (two) times daily. Take with or immediately following a meal. 180 tablet 3  . Multiple Vitamins-Minerals (MULTIVITAMIN WITH MINERALS) tablet Take 1 tablet by mouth daily.    [name] omeprazole (PRILOSEC) 20 MG capsule Take 20 mg by mouth daily as needed (acid reflux).     [name] oxybutynin (DITROPAN) 5 MG tablet Take 1 tablet (5 mg total) by mouth 2 (two) times daily. Yearly physical due in June must see MD for refills 180 tablet 1  . polycarbophil (FIBERCON) 625 MG tablet Take 1 tablet (625 mg total) by mouth  daily. 30 tablet 0  . potassium chloride SA (K-DUR,KLOR-CON) 20 MEQ tablet Take 1 tablet (20 mEq total) by mouth daily. 90 tablet 3  . temazepam (RESTORIL) 30 MG capsule Take 1 capsule (30 mg total) by mouth at bedtime. 30 capsule 5  . traMADol (ULTRAM) 50 MG tablet Take 1-2 tablets (50-100 mg total) by mouth every 6 (six) hours as needed. (Patient taking differently: Take 50-100 mg by mouth every 6 (six) hours as needed for moderate pain. ) 10 tablet 0  . vitamin C (ASCORBIC ACID) 500 MG tablet Take 500 mg by mouth daily.    [name] zinc sulfate (ZINC-220) 220 (50 Zn) MG capsule Take 220 mg by mouth daily.    .  polyethylene glycol (MIRALAX / GLYCOLAX) packet Take 17 g by mouth daily as needed for moderate constipation.      No facility-administered medications prior to visit.     ROS See HPI  Objective:  BP 140/82   Pulse 75   Temp 99.1 F (37.3 C)   SpO2 95%   BP Readings from Last 3 Encounters:  06/18/16 140/82  06/13/16 (!) 154/99  04/21/16 134/86    Wt Readings from Last 3 Encounters:  04/21/16 182 lb (82.6 kg)  04/14/16 182 lb 12.8 oz (82.9 kg)  11/13/15 182 lb (82.6 kg)    Physical Exam  Constitutional: She is oriented to person, place, and time. No distress.  Cardiovascular: Normal rate.   Pulmonary/Chest: Effort normal.  Abdominal: Soft. Bowel sounds are normal. She exhibits no distension. There is tenderness. There is no rebound and no guarding.  Neurological: She is alert and oriented to person, place, and time.  Vitals reviewed.   Lab Results  Component Value Date   WBC 5.6 06/13/2016   HGB 13.5 06/13/2016   HCT 40.9 06/13/2016   PLT 212 06/13/2016   GLUCOSE 106 (H) 06/13/2016   CHOL 116 06/25/2012   TRIG 82 09/05/2013   HDL 41.50 06/25/2012   LDLDIRECT 110.3 05/24/2008   LDLCALC 66 06/25/2012   ALT 22 06/13/2016   AST 25 06/13/2016   NA 141 06/13/2016   K 3.5 06/13/2016   CL 102 06/13/2016   CREATININE 0.96 06/13/2016   BUN 12 06/13/2016   CO2 32 06/13/2016   TSH 0.54 04/21/2016   INR 1.7 05/01/2016   HGBA1C 6.1 (H) 09/08/2013    Ct Renal Stone Study  Result Date: 06/13/2016 CLINICAL DATA:  Right lower quadrant, right flank pain EXAM: CT ABDOMEN AND PELVIS WITHOUT CONTRAST TECHNIQUE: Multidetector CT imaging of the abdomen and pelvis was performed following the standard protocol without IV contrast. COMPARISON:  09/23/2013 FINDINGS: Lower chest: Cardiomegaly. Diffuse coronary artery disease. No confluent opacities or effusions. Hepatobiliary: No focal hepatic abnormality. Probable small layering gallstones within the gallbladder. Pancreas: No focal  abnormality or ductal dilatation. Spleen: No focal abnormality.  Normal size. Adrenals/Urinary Tract: Mild bilateral hydronephrosis. No visible stones. Urinary bladder is grossly unremarkable. Adrenal glands unremarkable. Stomach/Bowel: Large stool burden in the colon, particularly rectosigmoid colon. Cannot exclude fecal impaction in the rectum. Sigmoid diverticulosis. No active diverticulitis. Stomach and small bowel decompressed, unremarkable. Vascular/Lymphatic: Aortic and iliac calcifications. No aneurysm or adenopathy. Reproductive: Uterus and adnexa unremarkable.  No mass. Other: No free fluid or free air. Musculoskeletal: No acute bony abnormality. Postoperative changes and degenerative changes in the lumbar spine. Prior right hip replacement. IMPRESSION: Mild bilateral hydronephrosis without obstructing stones. Exact cause of the hydronephrosis not evident. Moderate to large stool burden in the colon,  particularly rectum. Cannot exclude fecal impaction. Sigmoid diverticulosis. Aortoiliac atherosclerosis. Cardiomegaly, coronary artery disease. Electronically Signed   By: Rolm Baptise M.D.   On: 06/13/2016 10:17    Assessment & Plan:   [name] was seen today for follow-up.  Diagnoses and all orders for this visit:  Slow transit constipation -     polyethylene glycol (MIRALAX / GLYCOLAX) packet; Take 17 g by mouth daily. -     DG Abd 1 View; Future  Acute cystitis without hematuria -     sulfamethoxazole-trimethoprim (BACTRIM DS,SEPTRA DS) 800-160 MG tablet; Take 1 tablet by mouth 2 (two) times daily.  Lower abdominal pain -     polyethylene glycol (MIRALAX / GLYCOLAX) packet; Take 17 g by mouth daily. -     DG Abd 1 View; Future   I have changed [name]'s polyethylene glycol. I am also having her start on sulfamethoxazole-trimethoprim. Additionally, I am having her maintain her cholecalciferol, B-complex with vitamin C, Iron, acetaminophen, vitamin C, levothyroxine, omeprazole,  multivitamin with minerals, traMADol, diltiazem, metoprolol succinate, temazepam, busPIRone, oxybutynin, furosemide, anastrozole, apixaban, potassium chloride SA, atorvastatin, doxycycline, zinc sulfate, bisacodyl, docusate sodium, and polycarbophil.  Meds ordered this encounter  Medications  . polyethylene glycol (MIRALAX / GLYCOLAX) packet    Sig: Take 17 g by mouth daily.    Dispense:  30 each    Refill:  0    Order Specific Question:   Supervising Provider    Answer:   [name] [[id number]]  . sulfamethoxazole-trimethoprim (BACTRIM DS,SEPTRA DS) 800-160 MG tablet    Sig: Take 1 tablet by mouth 2 (two) times daily.    Dispense:  14 tablet    Refill:  0    Order Specific Question:   Supervising Provider    Answer:   [name] [[id number]]    Follow-up: Return if symptoms worsen or fail to improve.  Wilfred Lacy, NP

## 2016-06-19 NOTE — Telephone Encounter (Signed)
Contacted pt and gave MD response.

## 2016-06-19 NOTE — Telephone Encounter (Signed)
8 cups a day or so Thx

## 2016-06-23 ENCOUNTER — Telehealth: Payer: Self-pay | Admitting: Oncology

## 2016-06-23 ENCOUNTER — Telehealth: Payer: Self-pay | Admitting: Internal Medicine

## 2016-06-23 NOTE — Telephone Encounter (Signed)
Pt called back checking on response from Dr Alain [name]

## 2016-06-23 NOTE — Telephone Encounter (Signed)
pls see me this week Thx

## 2016-06-23 NOTE — Telephone Encounter (Signed)
Patient called to cancel her appointment. She is having back trouble and will call back to reschedule

## 2016-06-23 NOTE — Telephone Encounter (Signed)
Pt called stating that she was seen in the ED on 06/13/16 for an impaction. Seh then followed up with Baldo Ash on 06/18/16. She said that she is still not improved and her side hurts very bad. She wanted to check with Dr Alain Marion to see what he would recommend. Please advise.

## 2016-06-24 ENCOUNTER — Other Ambulatory Visit: Payer: Medicare Other

## 2016-06-24 ENCOUNTER — Ambulatory Visit (INDEPENDENT_AMBULATORY_CARE_PROVIDER_SITE_OTHER): Payer: Medicare Other | Admitting: Internal Medicine

## 2016-06-24 ENCOUNTER — Encounter: Payer: Self-pay | Admitting: Internal Medicine

## 2016-06-24 ENCOUNTER — Ambulatory Visit: Payer: Medicare Other | Admitting: Oncology

## 2016-06-24 DIAGNOSIS — K5900 Constipation, unspecified: Secondary | ICD-10-CM

## 2016-06-24 DIAGNOSIS — M5440 Lumbago with sciatica, unspecified side: Secondary | ICD-10-CM | POA: Diagnosis not present

## 2016-06-24 MED ORDER — PREDNISONE 10 MG PO TABS: ORAL_TABLET | ORAL | 1 refills | Status: DC

## 2016-06-24 MED ORDER — KETOROLAC TROMETHAMINE 30 MG/ML IJ SOLN
30.0000 mg | Freq: Once | INTRAMUSCULAR | Status: AC
  Administered 2016-06-24: 30 mg via INTRAMUSCULAR

## 2016-06-24 MED ORDER — LINACLOTIDE 290 MCG PO CAPS: 290.0000 ug | ORAL_CAPSULE | Freq: Every day | ORAL | 2 refills | Status: DC

## 2016-06-24 NOTE — Assessment & Plan Note (Signed)
R/o compr fx MRI Tramadol Prednisone 10 mg: take 4 tabs a day x 3 days; then 3 tabs a day x 4 days; then 2 tabs a day x 4 days, then 1 tab a day x 6 days, then stop. Take pc.

## 2016-06-24 NOTE — Patient Instructions (Signed)
Linzess 1 a day Miralax 1 scoop a day

## 2016-06-24 NOTE — Telephone Encounter (Signed)
Pt Dr Jacalyn Lefevre.. Pt is coming in this morning to be worked in.

## 2016-06-24 NOTE — Progress Notes (Signed)
Subjective:  Patient ID: [name], female    DOB: 08/05/1925  Age: 83 y.o. MRN: [id number]  CC: No chief complaint on file.   HPI [name] presents for constipation x 2 weeks and pain in the low back on B sides and in the. The pain in severe worse w/moving - 10/10. Not taking anything for pain except for Tylenol She has not been able to sleep x 1 weeks. Pt had a renal CT - (-) compr fx  Outpatient Medications Prior to Visit  Medication Sig Dispense Refill  . acetaminophen (TYLENOL) 500 MG tablet Take 500 mg by mouth daily as needed for mild pain.     [name] anastrozole (ARIMIDEX) 1 MG tablet Take 1 tablet (1 mg total) by mouth daily. 90 tablet 3  . apixaban (ELIQUIS) 2.5 MG TABS tablet Take 1 tablet (2.5 mg total) by mouth 2 (two) times daily. 180 tablet 3  . atorvastatin (LIPITOR) 10 MG tablet Take 1 tablet (10 mg total) by mouth every evening. 90 tablet 1  . B Complex-C (B-COMPLEX WITH VITAMIN C) tablet Take 1 tablet by mouth daily.    . bisacodyl (DULCOLAX) 10 MG suppository Place 1 suppository (10 mg total) rectally as needed for moderate constipation. 12 suppository 0  . busPIRone (BUSPAR) 7.5 MG tablet Take 1 tablet (7.5 mg total) by mouth 2 (two) times daily. Yearly physical due in June must see MD for refills 180 tablet 1  . Cholecalciferol (VITAMIN D3) 1000 UNITS tablet Take 1,000 Units by mouth daily.      [name] diltiazem (CARDIZEM CD) 120 MG 24 hr capsule TAKE 1 CAPSULE (120 MG TOTAL) BY MOUTH DAILY. 90 capsule 3  . docusate sodium (COLACE) 100 MG capsule Take 1 capsule (100 mg total) by mouth every 12 (twelve) hours. 60 capsule 0  . doxycycline (VIBRA-TABS) 100 MG tablet Take 100 mg by mouth 2 (two) times daily.    . Ferrous Sulfate (IRON) 325 (65 FE) MG TABS Take 1 tablet by mouth daily.    . furosemide (LASIX) 40 MG tablet Take 1 tablet (40 mg total) by mouth as directed. 1 po on Mon-Wed-Fri in am 90 tablet 1  . levothyroxine (SYNTHROID, LEVOTHROID) 25 MCG tablet TAKE 1  TABLET BY MOUTH ONCE DAILY FOR THYROID 90 tablet 3  . metoprolol succinate (TOPROL-XL) 50 MG 24 hr tablet Take 1 tablet (50 mg total) by mouth 2 (two) times daily. Take with or immediately following a meal. 180 tablet 3  . Multiple Vitamins-Minerals (MULTIVITAMIN WITH MINERALS) tablet Take 1 tablet by mouth daily.    [name] omeprazole (PRILOSEC) 20 MG capsule Take 20 mg by mouth daily as needed (acid reflux).     [name] oxybutynin (DITROPAN) 5 MG tablet Take 1 tablet (5 mg total) by mouth 2 (two) times daily. Yearly physical due in June must see MD for refills 180 tablet 1  . polycarbophil (FIBERCON) 625 MG tablet Take 1 tablet (625 mg total) by mouth daily. 30 tablet 0  . polyethylene glycol (MIRALAX / GLYCOLAX) packet Take 17 g by mouth daily. 30 each 0  . potassium chloride SA (K-DUR,KLOR-CON) 20 MEQ tablet Take 1 tablet (20 mEq total) by mouth daily. 90 tablet 3  . sulfamethoxazole-trimethoprim (BACTRIM DS,SEPTRA DS) 800-160 MG tablet Take 1 tablet by mouth 2 (two) times daily. 14 tablet 0  . temazepam (RESTORIL) 30 MG capsule Take 1 capsule (30 mg total) by mouth at bedtime. 30 capsule 5  . traMADol (ULTRAM) 50  MG tablet Take 1-2 tablets (50-100 mg total) by mouth every 6 (six) hours as needed. (Patient taking differently: Take 50-100 mg by mouth every 6 (six) hours as needed for moderate pain. ) 10 tablet 0  . vitamin C (ASCORBIC ACID) 500 MG tablet Take 500 mg by mouth daily.    [name] zinc sulfate (ZINC-220) 220 (50 Zn) MG capsule Take 220 mg by mouth daily.     No facility-administered medications prior to visit.     ROS Review of Systems  Constitutional: Negative for activity change, appetite change, chills, fatigue and unexpected weight change.  HENT: Negative for congestion, mouth sores and sinus pressure.   Eyes: Negative for visual disturbance.  Respiratory: Negative for cough and chest tightness.   Gastrointestinal: Positive for abdominal distention. Negative for abdominal pain and nausea.    Genitourinary: Negative for difficulty urinating, frequency and vaginal pain.  Musculoskeletal: Positive for back pain and gait problem.  Skin: Negative for pallor and rash.  Neurological: Negative for dizziness, tremors, weakness, numbness and headaches.  Psychiatric/Behavioral: Negative for confusion and sleep disturbance.    Objective:  BP 134/86 (BP Location: Left Arm, Patient Position: Sitting, Cuff Size: Normal)   Pulse 84   Temp 98.5 F (36.9 C) (Oral)   Ht 5' 5.5" (1.664 m)   SpO2 98%   BP Readings from Last 3 Encounters:  06/24/16 134/86  06/18/16 140/82  06/13/16 (!) 154/99    Wt Readings from Last 3 Encounters:  04/21/16 182 lb (82.6 kg)  04/14/16 182 lb 12.8 oz (82.9 kg)  11/13/15 182 lb (82.6 kg)    Physical Exam  Constitutional: She appears well-developed. No distress.  HENT:  Head: Normocephalic.  Right Ear: External ear normal.  Left Ear: External ear normal.  Nose: Nose normal.  Mouth/Throat: Oropharynx is clear and moist.  Eyes: Conjunctivae are normal. Pupils are equal, round, and reactive to light. Right eye exhibits no discharge. Left eye exhibits no discharge.  Neck: Normal range of motion. Neck supple. No JVD present. No tracheal deviation present. No thyromegaly present.  Cardiovascular: Normal rate, regular rhythm and normal heart sounds.   Pulmonary/Chest: No stridor. No respiratory distress. She has no wheezes.  Abdominal: Soft. Bowel sounds are normal. She exhibits no distension and no mass. There is no tenderness. There is no rebound and no guarding.  Musculoskeletal: She exhibits tenderness. She exhibits no edema.  Lymphadenopathy:    She has no cervical adenopathy.  Neurological: She displays normal reflexes. No cranial nerve deficit. She exhibits normal muscle tone. Coordination normal.  Skin: No rash noted. No erythema.  Psychiatric: She has a normal mood and affect. Her behavior is normal. Judgment and thought content normal.  Appears  tired LS tender w/ROM In a w/c  Lab Results  Component Value Date   WBC 5.6 06/13/2016   HGB 13.5 06/13/2016   HCT 40.9 06/13/2016   PLT 212 06/13/2016   GLUCOSE 106 (H) 06/13/2016   CHOL 116 06/25/2012   TRIG 82 09/05/2013   HDL 41.50 06/25/2012   LDLDIRECT 110.3 05/24/2008   LDLCALC 66 06/25/2012   ALT 22 06/13/2016   AST 25 06/13/2016   NA 141 06/13/2016   K 3.5 06/13/2016   CL 102 06/13/2016   CREATININE 0.96 06/13/2016   BUN 12 06/13/2016   CO2 32 06/13/2016   TSH 0.54 04/21/2016   INR 1.7 05/01/2016   HGBA1C 6.1 (H) 09/08/2013    Dg Abd 1 View  Result Date: 06/18/2016 CLINICAL DATA:  Lower abdominal pain.  Constipation. EXAM: ABDOMEN - 1 VIEW COMPARISON:  Scout image for CT scan of the abdomen dated 06/13/2016 FINDINGS: The bowel gas pattern is normal. No excessive stool in the colon. No fecal impaction. No acute bone abnormality.  Slight lumbar scoliosis. Aortic atherosclerosis. Evidence of previous lower lumbar fusions. Right total hip prosthesis. IMPRESSION: No acute abnormalities. Electronically Signed   By: Lorriane Shire M.D.   On: 06/18/2016 15:14    Assessment & Plan:   There are no diagnoses linked to this encounter. I am having Ms. Rau maintain her cholecalciferol, B-complex with vitamin C, Iron, acetaminophen, vitamin C, levothyroxine, omeprazole, multivitamin with minerals, traMADol, diltiazem, metoprolol succinate, temazepam, busPIRone, oxybutynin, furosemide, anastrozole, apixaban, potassium chloride SA, atorvastatin, doxycycline, zinc sulfate, bisacodyl, docusate sodium, polycarbophil, polyethylene glycol, and sulfamethoxazole-trimethoprim.  No orders of the defined types were placed in this encounter.    Follow-up: No Follow-up on file.  Walker Kehr, MD

## 2016-06-24 NOTE — Addendum Note (Signed)
Addended by: Karren Cobble on: 06/24/2016 09:59 AM   Modules accepted: Orders

## 2016-06-30 ENCOUNTER — Telehealth: Payer: Self-pay | Admitting: Internal Medicine

## 2016-06-30 NOTE — Telephone Encounter (Signed)
All future prescriptions needs to be sent to [facility] mail order

## 2016-06-30 NOTE — Telephone Encounter (Signed)
pharmacy changed

## 2016-07-03 ENCOUNTER — Telehealth: Payer: Self-pay | Admitting: Internal Medicine

## 2016-07-03 ENCOUNTER — Ambulatory Visit (INDEPENDENT_AMBULATORY_CARE_PROVIDER_SITE_OTHER): Payer: Medicare Other | Admitting: Internal Medicine

## 2016-07-03 ENCOUNTER — Telehealth: Payer: Self-pay

## 2016-07-03 ENCOUNTER — Encounter: Payer: Self-pay | Admitting: Internal Medicine

## 2016-07-03 DIAGNOSIS — K5901 Slow transit constipation: Secondary | ICD-10-CM | POA: Diagnosis not present

## 2016-07-03 DIAGNOSIS — D508 Other iron deficiency anemias: Secondary | ICD-10-CM

## 2016-07-03 DIAGNOSIS — M5440 Lumbago with sciatica, unspecified side: Secondary | ICD-10-CM

## 2016-07-03 MED ORDER — POLYETHYLENE GLYCOL 3350 17 GM/SCOOP PO POWD: 17.0000 g | Freq: Two times a day (BID) | ORAL | 5 refills | Status: DC | PRN

## 2016-07-03 MED ORDER — LINACLOTIDE 290 MCG PO CAPS: 290.0000 ug | ORAL_CAPSULE | Freq: Every day | ORAL | 3 refills | Status: DC

## 2016-07-03 NOTE — Patient Instructions (Signed)
  Turmeric tablets for pain

## 2016-07-03 NOTE — Assessment & Plan Note (Signed)
D/c po iron Linzess Miralax

## 2016-07-03 NOTE — Telephone Encounter (Signed)
Pt is waiting for authorization for Linzess, they are wondering if she can receive samples until then.

## 2016-07-03 NOTE — Progress Notes (Signed)
Subjective:  Patient ID: [name], female    DOB: [birth year]  Age: 83 y.o. MRN: [id number]  CC: No chief complaint on file.   HPI [name] presents for LBP - much better, constipation - much better, anemia f/u  Outpatient Medications Prior to Visit  Medication Sig Dispense Refill  . acetaminophen (TYLENOL) 500 MG tablet Take 500 mg by mouth daily as needed for mild pain.     [name] anastrozole (ARIMIDEX) 1 MG tablet Take 1 tablet (1 mg total) by mouth daily. 90 tablet 3  . apixaban (ELIQUIS) 2.5 MG TABS tablet Take 1 tablet (2.5 mg total) by mouth 2 (two) times daily. 180 tablet 3  . atorvastatin (LIPITOR) 10 MG tablet Take 1 tablet (10 mg total) by mouth every evening. 90 tablet 1  . B Complex-C (B-COMPLEX WITH VITAMIN C) tablet Take 1 tablet by mouth daily.    . bisacodyl (DULCOLAX) 10 MG suppository Place 1 suppository (10 mg total) rectally as needed for moderate constipation. 12 suppository 0  . busPIRone (BUSPAR) 7.5 MG tablet Take 1 tablet (7.5 mg total) by mouth 2 (two) times daily. Yearly physical due in June must see MD for refills 180 tablet 1  . Cholecalciferol (VITAMIN D3) 1000 UNITS tablet Take 1,000 Units by mouth daily.      [name] diltiazem (CARDIZEM CD) 120 MG 24 hr capsule TAKE 1 CAPSULE (120 MG TOTAL) BY MOUTH DAILY. 90 capsule 3  . docusate sodium (COLACE) 100 MG capsule Take 1 capsule (100 mg total) by mouth every 12 (twelve) hours. 60 capsule 0  . doxycycline (VIBRA-TABS) 100 MG tablet Take 100 mg by mouth 2 (two) times daily.    . Ferrous Sulfate (IRON) 325 (65 FE) MG TABS Take 1 tablet by mouth daily.    . furosemide (LASIX) 40 MG tablet Take 1 tablet (40 mg total) by mouth as directed. 1 po on Mon-Wed-Fri in am 90 tablet 1  . levothyroxine (SYNTHROID, LEVOTHROID) 25 MCG tablet TAKE 1 TABLET BY MOUTH ONCE DAILY FOR THYROID 90 tablet 3  . linaclotide (LINZESS) 290 MCG CAPS capsule Take 1 capsule (290 mcg total) by mouth daily. 30 capsule 2  . metoprolol succinate  (TOPROL-XL) 50 MG 24 hr tablet Take 1 tablet (50 mg total) by mouth 2 (two) times daily. Take with or immediately following a meal. 180 tablet 3  . Multiple Vitamins-Minerals (MULTIVITAMIN WITH MINERALS) tablet Take 1 tablet by mouth daily.    [name] omeprazole (PRILOSEC) 20 MG capsule Take 20 mg by mouth daily as needed (acid reflux).     [name] oxybutynin (DITROPAN) 5 MG tablet Take 1 tablet (5 mg total) by mouth 2 (two) times daily. Yearly physical due in June must see MD for refills 180 tablet 1  . polycarbophil (FIBERCON) 625 MG tablet Take 1 tablet (625 mg total) by mouth daily. 30 tablet 0  . polyethylene glycol (MIRALAX / GLYCOLAX) packet Take 17 g by mouth daily. 30 each 0  . potassium chloride SA (K-DUR,KLOR-CON) 20 MEQ tablet Take 1 tablet (20 mEq total) by mouth daily. 90 tablet 3  . predniSONE (DELTASONE) 10 MG tablet Prednisone 10 mg: take 4 tabs a day x 3 days; then 3 tabs a day x 4 days; then 2 tabs a day x 4 days, then 1 tab a day x 6 days, then stop. Take pc. 38 tablet 1  . sulfamethoxazole-trimethoprim (BACTRIM DS,SEPTRA DS) 800-160 MG tablet Take 1 tablet by mouth 2 (two) times daily.  14 tablet 0  . temazepam (RESTORIL) 30 MG capsule Take 1 capsule (30 mg total) by mouth at bedtime. 30 capsule 5  . traMADol (ULTRAM) 50 MG tablet Take 1-2 tablets (50-100 mg total) by mouth every 6 (six) hours as needed. (Patient taking differently: Take 50-100 mg by mouth every 6 (six) hours as needed for moderate pain. ) 10 tablet 0  . vitamin C (ASCORBIC ACID) 500 MG tablet Take 500 mg by mouth daily.    [name] zinc sulfate (ZINC-220) 220 (50 Zn) MG capsule Take 220 mg by mouth daily.     No facility-administered medications prior to visit.     ROS Review of Systems  Constitutional: Negative for activity change, appetite change, chills, fatigue and unexpected weight change.  HENT: Negative for congestion, mouth sores and sinus pressure.   Eyes: Negative for visual disturbance.  Respiratory: Negative for  cough and chest tightness.   Gastrointestinal: Negative for abdominal pain and nausea.  Genitourinary: Negative for difficulty urinating, frequency and vaginal pain.  Musculoskeletal: Negative for back pain and gait problem.  Skin: Negative for pallor and rash.  Neurological: Negative for dizziness, tremors, weakness, numbness and headaches.  Psychiatric/Behavioral: Negative for confusion and sleep disturbance.    Objective:  BP 130/84 (BP Location: Left Arm, Patient Position: Sitting, Cuff Size: Normal)   Pulse 67   Temp 98.4 F (36.9 C) (Oral)   Ht 5' 5.4" (1.661 m)   Wt 174 lb (78.9 kg)   SpO2 98%   BMI 28.60 kg/m   BP Readings from Last 3 Encounters:  07/03/16 130/84  06/24/16 134/86  06/18/16 140/82    Wt Readings from Last 3 Encounters:  07/03/16 174 lb (78.9 kg)  04/21/16 182 lb (82.6 kg)  04/14/16 182 lb 12.8 oz (82.9 kg)    Physical Exam  Constitutional: She appears well-developed. No distress.  HENT:  Head: Normocephalic.  Right Ear: External ear normal.  Left Ear: External ear normal.  Nose: Nose normal.  Mouth/Throat: Oropharynx is clear and moist.  Eyes: Conjunctivae are normal. Pupils are equal, round, and reactive to light. Right eye exhibits no discharge. Left eye exhibits no discharge.  Neck: Normal range of motion. Neck supple. No JVD present. No tracheal deviation present. No thyromegaly present.  Cardiovascular: Normal rate, regular rhythm and normal heart sounds.   Pulmonary/Chest: No stridor. No respiratory distress. She has no wheezes.  Abdominal: Soft. Bowel sounds are normal. She exhibits no distension and no mass. There is no tenderness. There is no rebound and no guarding.  Musculoskeletal: She exhibits tenderness. She exhibits no edema.  Lymphadenopathy:    She has no cervical adenopathy.  Neurological: She displays normal reflexes. No cranial nerve deficit. She exhibits normal muscle tone. Coordination abnormal.  Skin: No rash noted. No  erythema.  Psychiatric: She has a normal mood and affect. Her behavior is normal. Judgment and thought content normal.  LS tender Walker  Lab Results  Component Value Date   WBC 5.6 06/13/2016   HGB 13.5 06/13/2016   HCT 40.9 06/13/2016   PLT 212 06/13/2016   GLUCOSE 106 (H) 06/13/2016   CHOL 116 06/25/2012   TRIG 82 09/05/2013   HDL 41.50 06/25/2012   LDLDIRECT 110.3 05/24/2008   LDLCALC 66 06/25/2012   ALT 22 06/13/2016   AST 25 06/13/2016   NA 141 06/13/2016   K 3.5 06/13/2016   CL 102 06/13/2016   CREATININE 0.96 06/13/2016   BUN 12 06/13/2016   CO2 32 06/13/2016  TSH 0.54 04/21/2016   INR 1.7 05/01/2016   HGBA1C 6.1 (H) 09/08/2013    Dg Abd 1 View  Result Date: 06/18/2016 CLINICAL DATA:  Lower abdominal pain.  Constipation. EXAM: ABDOMEN - 1 VIEW COMPARISON:  Scout image for CT scan of the abdomen dated 06/13/2016 FINDINGS: The bowel gas pattern is normal. No excessive stool in the colon. No fecal impaction. No acute bone abnormality.  Slight lumbar scoliosis. Aortic atherosclerosis. Evidence of previous lower lumbar fusions. Right total hip prosthesis. IMPRESSION: No acute abnormalities. Electronically Signed   By: Lorriane Shire M.D.   On: 06/18/2016 15:14    Assessment & Plan:   There are no diagnoses linked to this encounter. I am having Ms. Dominski maintain her cholecalciferol, B-complex with vitamin C, Iron, acetaminophen, vitamin C, levothyroxine, omeprazole, multivitamin with minerals, traMADol, diltiazem, metoprolol succinate, temazepam, busPIRone, oxybutynin, furosemide, anastrozole, apixaban, potassium chloride SA, atorvastatin, doxycycline, zinc sulfate, bisacodyl, docusate sodium, polycarbophil, polyethylene glycol, sulfamethoxazole-trimethoprim, predniSONE, and linaclotide.  No orders of the defined types were placed in this encounter.    Follow-up: No Follow-up on file.  Walker Kehr, MD

## 2016-07-03 NOTE — Assessment & Plan Note (Signed)
Much better MRI pending Finish Prednisone

## 2016-07-03 NOTE — Assessment & Plan Note (Signed)
D/c oral iron due to constipation IV iron if needed

## 2016-07-03 NOTE — Telephone Encounter (Signed)
Key: [id number]

## 2016-07-04 NOTE — Telephone Encounter (Signed)
Patient received samples.

## 2016-07-14 ENCOUNTER — Telehealth: Payer: Self-pay | Admitting: Internal Medicine

## 2016-07-14 NOTE — Telephone Encounter (Signed)
Paperwork waiting for plot to sign

## 2016-07-14 NOTE — Telephone Encounter (Signed)
States that she spoke with someone last week who was going to process Linzess through cover my meds.  States insurance had not received anything as of today.  [employer] company is sending over a PA request.  Would like this to be looked out for and processed as soon as possible for patient.

## 2016-07-16 ENCOUNTER — Ambulatory Visit
Admission: RE | Admit: 2016-07-16 | Discharge: 2016-07-16 | Disposition: A | Discharge status: Home / Self Care | Payer: Medicare Other | Source: Ambulatory Visit | Attending: Internal Medicine | Admitting: Internal Medicine

## 2016-07-16 DIAGNOSIS — M5440 Lumbago with sciatica, unspecified side: Secondary | ICD-10-CM

## 2016-07-16 DIAGNOSIS — M48061 Spinal stenosis, lumbar region without neurogenic claudication: Secondary | ICD-10-CM | POA: Diagnosis not present

## 2016-07-16 MED ORDER — GADOBENATE DIMEGLUMINE 529 MG/ML IV SOLN
15.0000 mL | Freq: Once | INTRAVENOUS | Status: AC | PRN
  Administered 2016-07-16: 15 mL via INTRAVENOUS

## 2016-07-17 NOTE — Telephone Encounter (Signed)
paperworkfaxed 

## 2016-07-18 NOTE — Telephone Encounter (Signed)
Pt would like a call back she states she could not hear you well

## 2016-07-21 ENCOUNTER — Telehealth: Payer: Self-pay | Admitting: Internal Medicine

## 2016-07-21 NOTE — Telephone Encounter (Signed)
Pt needs a refill of doxycycline (VIBRA-TABS) 100 MG tablet and busPIRone (BUSPAR) 7.5 MG tablet

## 2016-07-22 MED ORDER — DOXYCYCLINE HYCLATE 100 MG PO TABS: 100.0000 mg | ORAL_TABLET | Freq: Two times a day (BID) | ORAL | 5 refills | Status: DC

## 2016-07-22 MED ORDER — BUSPIRONE HCL 7.5 MG PO TABS: 7.5000 mg | ORAL_TABLET | Freq: Two times a day (BID) | ORAL | 1 refills | Status: DC

## 2016-07-22 NOTE — Addendum Note (Signed)
Addended by: Earnstine Regal on: 07/22/2016 01:47 PM   Modules accepted: Orders

## 2016-07-22 NOTE — Telephone Encounter (Signed)
Sent both script to [facility]...Johny Chess

## 2016-07-22 NOTE — Telephone Encounter (Signed)
Ok to ref both [employer]

## 2016-07-22 NOTE — Telephone Encounter (Signed)
Pls advise../lmb 

## 2016-07-24 NOTE — Telephone Encounter (Signed)
Rec'd call from [facility] w/PA status med was denied reason  Non FDA approve use. Will mail pt/provider letter with status, and if needing to speak with appeal office can call back at [phone]...Johny Chess

## 2016-07-24 NOTE — Telephone Encounter (Signed)
Can we re-submit with FDA indication (CIC - chronic idiopathic constipation)? Thx

## 2016-07-24 NOTE — Telephone Encounter (Signed)
Linzess denied, please advise

## 2016-07-24 NOTE — Telephone Encounter (Signed)
re-faxed

## 2016-07-24 NOTE — Telephone Encounter (Signed)
Pt would like for you to refax the PA.  She states the pharmacists spoke to the ins company and they have not received it

## 2016-07-25 NOTE — Telephone Encounter (Signed)
Please call [employer] back regarding appeal request  [phone] [place]    If you have any additional info for appeal please fax to  Fax  Number [phone]

## 2016-07-25 NOTE — Telephone Encounter (Signed)
Appeal started. 

## 2016-07-25 NOTE — Telephone Encounter (Signed)
Pt would like to come pick up some samples of Linzess, while we wait for appeal

## 2016-07-25 NOTE — Telephone Encounter (Signed)
Patient given samples per Dr.[name] and notified by [facility] they were ready to pick up

## 2016-07-25 NOTE — Telephone Encounter (Signed)
LMTCB

## 2016-07-28 ENCOUNTER — Other Ambulatory Visit (HOSPITAL_BASED_OUTPATIENT_CLINIC_OR_DEPARTMENT_OTHER): Payer: Medicare Other

## 2016-07-28 ENCOUNTER — Telehealth: Payer: Self-pay | Admitting: Internal Medicine

## 2016-07-28 ENCOUNTER — Ambulatory Visit (HOSPITAL_BASED_OUTPATIENT_CLINIC_OR_DEPARTMENT_OTHER): Payer: Medicare Other | Admitting: Oncology

## 2016-07-28 VITALS — BP 127/62 | HR 71 | Temp 97.4°F | Resp 16 | Ht 65.4 in | Wt 178.1 lb

## 2016-07-28 DIAGNOSIS — Z170 Estrogen receptor positive status [ER+]: Secondary | ICD-10-CM

## 2016-07-28 DIAGNOSIS — C50412 Malignant neoplasm of upper-outer quadrant of left female breast: Secondary | ICD-10-CM

## 2016-07-28 DIAGNOSIS — C50912 Malignant neoplasm of unspecified site of left female breast: Secondary | ICD-10-CM

## 2016-07-28 LAB — COMPREHENSIVE METABOLIC PANEL
ALT: 24 U/L (ref 0–55)
AST: 21 U/L (ref 5–34)
Albumin: 4 g/dL (ref 3.5–5.0)
Alkaline Phosphatase: 96 U/L (ref 40–150)
Anion Gap: 10 mEq/L (ref 3–11)
BUN: 9.5 mg/dL (ref 7.0–26.0)
CO2: 28 mEq/L (ref 22–29)
Calcium: 9.6 mg/dL (ref 8.4–10.4)
Chloride: 100 mEq/L (ref 98–109)
Creatinine: 1 mg/dL (ref 0.6–1.1)
EGFR: 48 mL/min/{1.73_m2} — ABNORMAL LOW (ref >90)
Glucose: 160 mg/dl — ABNORMAL HIGH (ref 70–140)
Potassium: 3.8 mEq/L (ref 3.5–5.1)
Sodium: 137 mEq/L (ref 136–145)
Total Bilirubin: 1.21 mg/dL — ABNORMAL HIGH (ref 0.20–1.20)
Total Protein: 7.4 g/dL (ref 6.4–8.3)

## 2016-07-28 LAB — CBC WITH DIFFERENTIAL/PLATELET
BASO%: 0.9 % (ref 0.0–2.0)
Basophils Absolute: 0.1 10*3/uL (ref 0.0–0.1)
EOS%: 1.3 % (ref 0.0–7.0)
Eosinophils Absolute: 0.1 10*3/uL (ref 0.0–0.5)
HCT: 41.9 % (ref 34.8–46.6)
HGB: 13.9 g/dL (ref 11.6–15.9)
LYMPH%: 29.7 % (ref 14.0–49.7)
MCH: 34.9 pg — ABNORMAL HIGH (ref 25.1–34.0)
MCHC: 33.2 g/dL (ref 31.5–36.0)
MCV: 105.1 fL — ABNORMAL HIGH (ref 79.5–101.0)
MONO#: 0.6 10*3/uL (ref 0.1–0.9)
MONO%: 10.5 % (ref 0.0–14.0)
NEUT#: 3.3 10*3/uL (ref 1.5–6.5)
NEUT%: 57.6 % (ref 38.4–76.8)
Platelets: 215 10*3/uL (ref 145–400)
RBC: 3.99 10*6/uL (ref 3.70–5.45)
RDW: 13.8 % (ref 11.2–14.5)
WBC: 5.7 10*3/uL (ref 3.9–10.3)
lymph#: 1.7 10*3/uL (ref 0.9–3.3)

## 2016-07-28 MED ORDER — ANASTROZOLE 1 MG PO TABS: 1.0000 mg | ORAL_TABLET | Freq: Every day | ORAL | 3 refills | Status: DC

## 2016-07-28 NOTE — Telephone Encounter (Signed)
Pt called in and wants to know since her PA-Linzess was denied what does plot want to do about her meds now?   She would like someone to call her about this asap  Best number [phone]

## 2016-07-28 NOTE — Progress Notes (Signed)
Welcome  [phone] Fax:([phone]) [phone]     ID: GITA DILGER DOB: 09/11/1941  MR#: [id number]  STM#:[id number]  Patient Care Team: [name], MD as PCP - General Rolm Bookbinder, MD as Consulting Physician (General Surgery) Magrinat, Virgie Dad, MD as Consulting Physician (Oncology) Burnell Blanks, MD as Consulting Physician (Cardiology) Jarome Matin, MD as Consulting Physician (Dermatology) Meredith Pel, MD as Consulting Physician (Orthopedic Surgery) OTHER MD:  CHIEF COMPLAINT: Estrogen receptor positive invasive breast cancer  CURRENT TREATMENT: Anastrozole   BREAST CANCER HISTORY: From the original intake note:  Austin noted a change in her left breast and had bilateral screening mammography [facility] 06/07/2015, with tomosynthesis. Breast density was category A. There was a new oval mass in the left breast central to the nipple. Left breast ultrasound the same day confirmed a 3.4 cm oval mass in the left breast upper outer quadrant, correlating with the mammography. The left axilla was sonographically benign.  Biopsy of the left breast mass in question 06/11/2015 showed ([id number]) a papillary lesion, consistent with an intraductal papilloma, but due to fragmentation the edges of the lesion could not be assessed.  Accordingly the patient was referred to surgery, and after appropriate discussion underwent left lumpectomy 07/25/2015. The final pathology ([id number]) showed 2 foci of invasive ductal carcinoma, grade 2, measuring 3.0 cm, and grade 1, measuring 0.9 cm. There was also ductal carcinoma in situ. The invasive tumor was present at the superior margin, and ductal carcinoma in situ was focally less than 0.1 cm to the same margin. There was evidence of lymphovascular invasion. The prognostic panel showed estrogen receptor 100% positive, progesterone receptor 100% positive, both with strong staining intensity, for both  lesions. HER-2 was also negative for both lesions, the signals ratio being 1.45-1.65, and the number per cell 2.10-2.80.  The patient's subsequent history is as detailed below  INTERVAL HISTORY: 83 returns today for follow-up and treatment of her estrogen receptor positive breast cancer accompanied by her husband and daughter. Brennen continues on anastrozole, with good tolerance.  Hot flashes and vaginal dryness are not a major issue. She never developed the arthralgias or myalgias that many patients can experience on this medication. She obtains it at a good price.  REVIEW OF SYSTEMS: Ayrianna participates in activities at home 83 senior community. She otherwise is "not doing much". The good news though is that there are no new major complaints. She does have significant back pain, which he treats with Tylenol. She recently had a MRI of the back which shows no cancer although there is significant arthritis. A detailed review of systems today was entirely stable.    PAST MEDICAL HISTORY: Past Medical History:  Diagnosis Date  . Anticoagulated on Coumadin   . Anxiety    denies  . Bilateral edema of lower extremity   . Cancer ([place])    left breast  . Chronic constipation   . Diverticulosis of colon   . GERD (gastroesophageal reflux disease)   . H/O hiatal hernia   . History of cellulitis    LEFT LOWER LEG --  SEPT 2015  . History of colitis    DX  2003  WITH ISCHEMIC COLITIS  . History of diverticulitis of colon    perferated diverticulitis w/ abscess 08-25-2013 drain placed --  resolved without surgical intervention  . History of GI bleed    2011--- UPPER SECONARY GASTRIC ULCER FROM NSAID USE  . History of Helicobacter pylori infection  2011  . History of ulcer of lower limb    2012   RIGHT LOWER EXTREMITIY--  STATSIS ULCER  . Hyperlipidemia   . Hypertension   . Hypothyroidism   . Iron deficiency anemia 2011   elevated MCV  . LBP (low back pain)    severe lumbar spondylosis s/p  L3/L4,L4/L5 fusion 12/10  . Lumbar spondylolysis   . OA (osteoarthritis)    "hands" (08/25/2013)  . Open wound of left lower leg   . Osteopenia   . Persistent atrial fibrillation ([place])    CARDIOLOGIST-   DR MCALANY  . RBBB    at fib  . Urinary frequency     PAST SURGICAL HISTORY: Past Surgical History:  Procedure Laterality Date  . APPLICATION OF A-CELL OF EXTREMITY Left 06/21/2013   Procedure: APPLICATION OF A-CELL OF EXTREMITY;  Surgeon: Theodoro Kos, DO;  Location: [place];  Service: Plastics;  Laterality: Left;  . APPLICATION OF A-CELL OF EXTREMITY Left 08/22/2013   Procedure: APPLICATION OF A-CELL/VAC TO LOWER LEFT LEG WOUND;  Surgeon: Theodoro Kos, DO;  Location: [place];  Service: Plastics;  Laterality: Left;  . APPLICATION OF A-CELL OF EXTREMITY Left 02/06/2014   Procedure: WITH PLACEMENT OF A -CELL ;  Surgeon: Theodoro Kos, DO;  Location: [place];  Service: Plastics;  Laterality: Left;  . APPLICATION OF A-CELL OF EXTREMITY Left 08/09/2014   Procedure: PLACEMENT OF A CELL;  Surgeon: Theodoro Kos, DO;  Location: [place];  Service: Plastics;  Laterality: Left;  . APPLICATION OF WOUND VAC Left 06/16/2013   Procedure: APPLICATION OF WOUND VAC;  Surgeon: Meredith Pel, MD;  Location: [place];  Service: Orthopedics;  Laterality: Left;  . APPLICATION OF WOUND VAC Left 06/21/2013   Procedure: APPLICATION OF WOUND VAC;  Surgeon: Theodoro Kos, DO;  Location: [place];  Service: Plastics;  Laterality: Left;  . BIOPSY BREAST  07/25/2015   LEFT RADIOACTIVE SEED GUIDED EXCISIONAL BREAST BIOPSY (Left) as a surgical intervention  . EXTERNAL FIXATION LEG Left 06/16/2013   Procedure: EXTERNAL FIXATION LEG;  Surgeon: Meredith Pel, MD;  Location: [place];  Service: Orthopedics;  Laterality: Left;  . EXTERNAL FIXATION REMOVAL Left 06/21/2013   Procedure: REMOVAL EXTERNAL FIXATION LEG;  Surgeon: Meredith Pel, MD;  Location: [place];  Service: Orthopedics;   Laterality: Left;  . EYE SURGERY Bilateral    cataract removal  . HARDWARE REMOVAL Left 05/23/2014   Procedure: HARDWARE REMOVAL;  Surgeon: Meredith Pel, MD;  Location: [place];  Service: Orthopedics;  Laterality: Left;  REMOVAL OF HARDWARE LEFT TIBIA  . I&D EXTREMITY Left 06/16/2013   Procedure: IRRIGATION AND DEBRIDEMENT EXTREMITY;  Surgeon: Meredith Pel, MD;  Location: [place];  Service: Orthopedics;  Laterality: Left;  . I&D EXTREMITY Left 02/06/2014   Procedure: IRRIGATION AND DEBRIDEMENT LEFT LEG WOUND ;  Surgeon: Theodoro Kos, DO;  Location: [place];  Service: Plastics;  Laterality: Left;  . I&D EXTREMITY Left 08/09/2014   Procedure: IRRIGATION AND DEBRIDEMENT  OF LEFT LOWER LEG;  Surgeon: Theodoro Kos, DO;  Location: [place];  Service: Plastics;  Laterality: Left;  . LUMBAR FUSION  01-02-2009   L3-L5  . RADIOACTIVE SEED GUIDED EXCISIONAL BREAST BIOPSY Left 07/25/2015   Procedure: LEFT RADIOACTIVE SEED GUIDED EXCISIONAL BREAST BIOPSY;  Surgeon: Rolm Bookbinder, MD;  Location: [place];  Service: General;  Laterality: Left;  . RE-EXCISION OF BREAST LUMPECTOMY Left 09/05/2015   Procedure: RE-EXCISION OF LEFT BREAST LUMPECTOMY;  Surgeon: Rolm Bookbinder, MD;  Location: [place];  Service: General;  Laterality: Left;  . SHOULDER OPEN ROTATOR CUFF REPAIR Left 1997  . TIBIA IM NAIL INSERTION Left 06/21/2013   Procedure: INTRAMEDULLARY (IM) NAIL TIBIAL;  Surgeon: Meredith Pel, MD;  Location: [place];  Service: Orthopedics;  Laterality: Left;  . TONSILLECTOMY  AS CHILD  . TOTAL HIP ARTHROPLASTY Right 06-07-2010  . TRANSTHORACIC ECHOCARDIOGRAM  09-11-2010   DR MCALHANY   LVSF  55-60%/  MILD MV CALCIFICATION WITH NO STENOSIS/  MILD MR & TR / MODERATE LAE/  MODERATE TO SEVERE RAE  . UPPER GI ENDOSCOPY  03-29-2010    FAMILY HISTORY Family History  Problem Relation Age of Onset  . Cancer Father   . Hypertension Other   The patient's father died at the age of 37 from heart  problems. The patient's mother died at the age of 16 with a ruptured aneurysm. The patient had no brothers, 1 sister. There is no history of breast or ovarian cancer in the family.   GYNECOLOGIC HISTORY:  No LMP recorded. Patient is postmenopausal. Menarche age 69, first live birth age 24. The patient is GX P2. She stopped having periods in her early 80s. She did not take hormone replacement. She used oral contraceptives remotely for approximately 2 years, with no complications.  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 Her husband Ernie Hew was a Airline pilot. They currently live at [facility]. Daughter Jerene Dilling lives in [place], near [place]. With her husband Jenny Reichmann. Daughter Pam lives in [place]. The patient has 2 grandchildren and 4 great-grandchildren. She is a Psychologist, forensic.    ADVANCED DIRECTIVES: The patient has a living will in place and she has named her granddaughter Almetta Lovely as her healthcare part of attorney. Lattie Haw can be reached at [place]: Social History  Substance Use Topics  . Smoking status: Never Smoker  . Smokeless tobacco: Never Used  . Alcohol use No     Comment: 1 glass occ wine 08/03/14- none in a year     Colonoscopy: Laurence Spates, remote  PAP:  Bone density:   Allergies  Allergen Reactions  . Zosyn [Piperacillin Sod-Tazobactam So] Hives, Rash and Other (See Comments)    Morbilliform eruption with itching  . Atenolol Nausea And Vomiting  . Clarithromycin Nausea And Vomiting  . Codeine Sulfate Nausea Only  . Levaquin [Levofloxacin] Nausea Only  . Macrodantin Other (See Comments)    UNSPECIFIED   . Oxycodone-Acetaminophen Nausea And Vomiting  . Percocet [Oxycodone-Acetaminophen] Nausea And Vomiting    Current Outpatient Prescriptions  Medication Sig Dispense Refill  . acetaminophen (TYLENOL) 500 MG tablet Take 500 mg by mouth daily as needed for mild pain.     [name] anastrozole (ARIMIDEX)  1 MG tablet Take 1 tablet (1 mg total) by mouth daily. 90 tablet 3  . apixaban (ELIQUIS) 2.5 MG TABS tablet Take 1 tablet (2.5 mg total) by mouth 2 (two) times daily. 180 tablet 3  . atorvastatin (LIPITOR) 10 MG tablet Take 1 tablet (10 mg total) by mouth every evening. 90 tablet 1  . B Complex-C (B-COMPLEX WITH VITAMIN C) tablet Take 1 tablet by mouth daily.    . bisacodyl (DULCOLAX) 10 MG suppository Place 1 suppository (10 mg total) rectally as needed for moderate constipation. 12 suppository 0  . busPIRone (BUSPAR) 7.5 MG tablet Take 1 tablet (7.5 mg total) by mouth 2 (two) times daily. [name]  tablet 1  . Cholecalciferol (VITAMIN D3) 1000 UNITS tablet Take 1,000 Units by mouth daily.      [name] diltiazem (CARDIZEM CD) 120 MG 24 hr capsule TAKE 1 CAPSULE (120 MG TOTAL) BY MOUTH DAILY. 90 capsule 3  . docusate sodium (COLACE) 100 MG capsule Take 1 capsule (100 mg total) by mouth every 12 (twelve) hours. 60 capsule 0  . doxycycline (VIBRA-TABS) 100 MG tablet Take 1 tablet (100 mg total) by mouth 2 (two) times daily. 60 tablet 5  . Ferrous Sulfate (IRON) 325 (65 FE) MG TABS Take 1 tablet by mouth daily.    . furosemide (LASIX) 40 MG tablet Take 1 tablet (40 mg total) by mouth as directed. 1 po on Mon-Wed-Fri in am 90 tablet 1  . levothyroxine (SYNTHROID, LEVOTHROID) 25 MCG tablet TAKE 1 TABLET BY MOUTH ONCE DAILY FOR THYROID 90 tablet 3  . linaclotide (LINZESS) 290 MCG CAPS capsule Take 1 capsule (290 mcg total) by mouth daily. 90 capsule 3  . metoprolol succinate (TOPROL-XL) 50 MG 24 hr tablet Take 1 tablet (50 mg total) by mouth 2 (two) times daily. Take with or immediately following a meal. 180 tablet 3  . Multiple Vitamins-Minerals (MULTIVITAMIN WITH MINERALS) tablet Take 1 tablet by mouth daily.    [name] omeprazole (PRILOSEC) 20 MG capsule Take 20 mg by mouth daily as needed (acid reflux).     [name] oxybutynin (DITROPAN) 5 MG tablet Take 1 tablet (5 mg total) by mouth 2 (two) times daily. Yearly physical due  in June must see MD for refills 180 tablet 1  . polycarbophil (FIBERCON) 625 MG tablet Take 1 tablet (625 mg total) by mouth daily. 30 tablet 0  . polyethylene glycol powder (GLYCOLAX/MIRALAX) powder Take 17-34 g by mouth 2 (two) times daily as needed for moderate constipation. 850 g 5  . potassium chloride SA (K-DUR,KLOR-CON) 20 MEQ tablet Take 1 tablet (20 mEq total) by mouth daily. 90 tablet 3  . temazepam (RESTORIL) 30 MG capsule Take 1 capsule (30 mg total) by mouth at bedtime. 30 capsule 5  . traMADol (ULTRAM) 50 MG tablet Take 1-2 tablets (50-100 mg total) by mouth every 6 (six) hours as needed. (Patient taking differently: Take 50-100 mg by mouth every 6 (six) hours as needed for moderate pain. ) 10 tablet 0  . vitamin C (ASCORBIC ACID) 500 MG tablet Take 500 mg by mouth daily.    [name] zinc sulfate (ZINC-220) 220 (50 Zn) MG capsule Take 220 mg by mouth daily.     No current facility-administered medications for this visit.     OBJECTIVE: Elderly white womanWho appears stated age  83:   07/28/16 1409  BP: 127/62  Pulse: 71  Resp: 16  Temp: (!) 97.4 F (36.3 C)     Body mass index is 29.28 kg/m.    ECOG FS:1 - Symptomatic but completely ambulatory  Sclerae unicteric, EOMs intact Oropharynx clear and moist No cervical or supraclavicular adenopathy Lungs no rales or rhonchi Heart regular rate and rhythm Abd soft, nontender, positive bowel sounds MSK no focal spinal tenderness, no upper extremity lymphedema Neuro: nonfocal, well oriented, appropriate affect Breasts: The right breast is benign. The left breast is status post lumpectomy. There is no evidence of local recurrence. Both axillae are benign.    LAB RESULTS:  CMP     Component Value Date/Time   NA 137 07/28/2016 1334   K 3.8 07/28/2016 1334   CL 102 06/13/2016 0621   CO2 28  07/28/2016 1334   GLUCOSE 160 (H) 07/28/2016 1334   BUN 9.5 07/28/2016 1334   CREATININE 1.0 07/28/2016 1334   CALCIUM 9.6 07/28/2016  1334   PROT 7.4 07/28/2016 1334   ALBUMIN 4.0 07/28/2016 1334   AST 21 07/28/2016 1334   ALT 24 07/28/2016 1334   ALKPHOS 96 07/28/2016 1334   BILITOT 1.21 (H) 07/28/2016 1334   GFRNONAA 51 (L) 06/13/2016 0621   GFRAA 59 (L) 06/13/2016 0621    INo results found for: SPEP, UPEP  Lab Results  Component Value Date   WBC 5.7 07/28/2016   NEUTROABS 3.3 07/28/2016   HGB 13.9 07/28/2016   HCT 41.9 07/28/2016   MCV 105.1 (H) 07/28/2016   PLT 215 07/28/2016      Chemistry      Component Value Date/Time   NA 137 07/28/2016 1334   K 3.8 07/28/2016 1334   CL 102 06/13/2016 0621   CO2 28 07/28/2016 1334   BUN 9.5 07/28/2016 1334   CREATININE 1.0 07/28/2016 1334      Component Value Date/Time   CALCIUM 9.6 07/28/2016 1334   ALKPHOS 96 07/28/2016 1334   AST 21 07/28/2016 1334   ALT 24 07/28/2016 1334   BILITOT 1.21 (H) 07/28/2016 1334       No results found for: LABCA2  No components found for: LABCA125  No results for input(s): INR in the last 168 hours.  Urinalysis    Component Value Date/Time   COLORURINE YELLOW 06/13/2016 0814   APPEARANCEUR CLEAR 06/13/2016 0814   LABSPEC 1.005 06/13/2016 0814   PHURINE 8.0 06/13/2016 0814   GLUCOSEU NEGATIVE 06/13/2016 0814   GLUCOSEU NEGATIVE 01/17/2015 1415   HGBUR NEGATIVE 06/13/2016 0814   BILIRUBINUR NEGATIVE 06/13/2016 0814   KETONESUR NEGATIVE 06/13/2016 0814   PROTEINUR NEGATIVE 06/13/2016 0814   UROBILINOGEN 0.2 01/17/2015 1415   NITRITE POSITIVE (A) 06/13/2016 0814   LEUKOCYTESUR TRACE (A) 06/13/2016 0814     STUDIES: Mr Lumbar Spine W Wo Contrast  Result Date: 07/16/2016 CLINICAL DATA:  Acute midline low back pain with sciatica Creatinine was obtained on site at [facility] at [address] Results: Creatinine 0.9 mg/dL. EXAM: MRI LUMBAR SPINE WITHOUT AND WITH CONTRAST TECHNIQUE: Multiplanar and multiecho pulse sequences of the lumbar spine were obtained without and with intravenous contrast.  CONTRAST:  66m MULTIHANCE GADOBENATE DIMEGLUMINE 529 MG/ML IV SOLN COMPARISON:  No prior MRI.  CT abdomen pelvis 06/13/2016 FINDINGS: Segmentation:  Normal Alignment: Slight anterolisthesis L2-3. 5 mm anterolisthesis L3-4. Mild anterolisthesis L4-5 Vertebrae: Mild compression fracture of T12 with bone marrow edema. Mild edema in the superior endplate of L1. These lesions are most likely due to benign fractures. These areas show diffuse enhancement and were not present on the recent CT. Conus medullaris: Extends to the L1-2 level and appears normal. Paraspinal and other soft tissues: No mass or adenopathy Disc levels: T11-12:  Mild disc and facet degeneration T12-L1:  Mild disc degeneration without stenosis L1-2:  Mild disc bulging and facet degeneration without stenosis L2-3: Diffuse bulging of the disc. Bilateral facet hypertrophy. Mild foraminal narrowing bilaterally. L3-4: Solid interbody fusion. 5 mm anterolisthesis. Disc degeneration and spurring. Bilateral facet degeneration. Moderately severe spinal stenosis. Subarticular stenosis bilaterally. L4-5: Solid interbody fusion. Mild anterolisthesis. Mild subarticular and foraminal narrowing on the left due to spurring L5-S1: Disc degeneration with large left lateral osteophyte. No significant foraminal encroachment. IMPRESSION: Mild compression fracture of T12 which appears recent. Mild bone marrow edema enhancement L1 vertebral body consistent  with contusion. No retropulsion of bone into the canal. Moderate spinal stenosis L3-4. Solid interbody fusion L3-4 and L4-5. Electronically Signed   By: Franchot Gallo M.D.   On: 07/16/2016 15:12    ELIGIBLE FOR AVAILABLE RESEARCH PROTOCOL: no  ASSESSMENT: 83 y.o. [place] woman status post left breast upper outer quadrant biopsy 06/11/2015 for a papillary lesion with unclear margins.  (1) status post left lumpectomy 07/25/2015 for an mpT2 cN0, stage IIA  invasive ductal carcinoma,  grade I-II  estrogen and  progesterone receptor strongly positive, HER-2 not amplified   (a) additional surgery 09/05/2015 found some residual ductal carcinoma in situ but the margins were clear.  (2) no sentinel lymph node sampling or adjuvant radiation planned  (3) adjuvant anastrozole started 08/10/2015  PLAN:  Lyndia is now a year out from definitive surgery for her breast cancer with no evidence of disease recurrence. This is favorable.  She is tolerating anastrozole well. The plan is to continue it for total of 5 years.  At this point I feel comfortable seeing her on a once a year basis. She knows to call for any problems that may develop before then.  Chauncey Cruel, MD   07/28/2016 2:33 PM Medical Oncology and Hematology [facility] [address] [place], [place] [address] Tel. [phone]    Fax. [phone]

## 2016-07-29 NOTE — Telephone Encounter (Signed)
Yvone Neu called back and Angela Cox was approved  Will be faxing over approval

## 2016-07-29 NOTE — Telephone Encounter (Signed)
Pt notified, LM with [employer] again  Morey Hummingbird      11:02 AM  Note    Please call [employer] back regarding appeal request  [phone] [employer]    If you have any additional info for appeal please fax to  Fax  Number [phone]     Me      9:21 AM  Note    Appeal started

## 2016-07-29 NOTE — Telephone Encounter (Signed)
Called Yvone Neu back and gave date of when patient came in about constipation

## 2016-07-29 NOTE — Telephone Encounter (Signed)
[name] from [name] Has a question about PA

## 2016-07-30 NOTE — Telephone Encounter (Signed)
LM notifying pt

## 2016-07-31 NOTE — Telephone Encounter (Signed)
Returned call and she just wanted to make sure we got where they over turned denial.

## 2016-07-31 NOTE — Telephone Encounter (Signed)
[name] from [place], you called on Tuesday and she just returned to the office today  [phone]

## 2016-08-11 ENCOUNTER — Telehealth: Payer: Self-pay | Admitting: Internal Medicine

## 2016-08-11 MED ORDER — LEVOTHYROXINE SODIUM 25 MCG PO TABS: ORAL_TABLET | ORAL | 1 refills | Status: DC

## 2016-08-11 NOTE — Telephone Encounter (Signed)
Verified chart pt is up-to-date rx sent to [facility],,,/lmb

## 2016-08-11 NOTE — Telephone Encounter (Signed)
Pt is needing a refill on levothyroxine (SYNTHROID, LEVOTHROID) 25 MCG tablet sent to [facility].

## 2016-08-15 ENCOUNTER — Ambulatory Visit: Payer: Medicare Other | Admitting: Internal Medicine

## 2016-08-22 ENCOUNTER — Encounter: Payer: Self-pay | Admitting: Internal Medicine

## 2016-08-22 ENCOUNTER — Ambulatory Visit (INDEPENDENT_AMBULATORY_CARE_PROVIDER_SITE_OTHER): Payer: Medicare Other | Admitting: Internal Medicine

## 2016-08-22 DIAGNOSIS — M5440 Lumbago with sciatica, unspecified side: Secondary | ICD-10-CM

## 2016-08-22 DIAGNOSIS — K5901 Slow transit constipation: Secondary | ICD-10-CM

## 2016-08-22 DIAGNOSIS — M8000XS Age-related osteoporosis with current pathological fracture, unspecified site, sequela: Secondary | ICD-10-CM

## 2016-08-22 DIAGNOSIS — D508 Other iron deficiency anemias: Secondary | ICD-10-CM | POA: Diagnosis not present

## 2016-08-22 DIAGNOSIS — M810 Age-related osteoporosis without current pathological fracture: Secondary | ICD-10-CM | POA: Insufficient documentation

## 2016-08-22 MED ORDER — TEMAZEPAM 30 MG PO CAPS: 30.0000 mg | ORAL_CAPSULE | Freq: Every day | ORAL | 5 refills | Status: DC

## 2016-08-22 MED ORDER — ZOSTER VAC RECOMB ADJUVANTED 50 MCG/0.5ML IM SUSR: 0.5000 mL | Freq: Once | INTRAMUSCULAR | 1 refills | Status: AC

## 2016-08-22 NOTE — Assessment & Plan Note (Signed)
Linzess qd is helping

## 2016-08-22 NOTE — Assessment & Plan Note (Signed)
F/u w/Dr Magrinat 

## 2016-08-22 NOTE — Assessment & Plan Note (Addendum)
MRI IMPRESSION: Mild compression fracture of T12 which appears recent. Mild bone marrow edema enhancement L1 vertebral body consistent with contusion. No retropulsion of bone into the canal.  Moderate spinal stenosis L3-4. Solid interbody fusion L3-4 and L4-5.   Electronically Signed   By: Franchot Gallo M.D.   On: 07/16/2016 15:12   Tylenol prn

## 2016-08-22 NOTE — Assessment & Plan Note (Signed)
Start Prolia 

## 2016-08-22 NOTE — Progress Notes (Signed)
Subjective:  Patient ID: [name], female    DOB: [birth year]  Age: 83 y.o. MRN: [id number]  CC: No chief complaint on file.   HPI [name] presents for a 6 weeks f/u LBP, constipation, osteoporosis   Outpatient Medications Prior to Visit  Medication Sig Dispense Refill  . acetaminophen (TYLENOL) 500 MG tablet Take 500 mg by mouth daily as needed for mild pain.     [name] anastrozole (ARIMIDEX) 1 MG tablet Take 1 tablet (1 mg total) by mouth daily. 90 tablet 3  . apixaban (ELIQUIS) 2.5 MG TABS tablet Take 1 tablet (2.5 mg total) by mouth 2 (two) times daily. 180 tablet 3  . atorvastatin (LIPITOR) 10 MG tablet Take 1 tablet (10 mg total) by mouth every evening. 90 tablet 1  . B Complex-C (B-COMPLEX WITH VITAMIN C) tablet Take 1 tablet by mouth daily.    . busPIRone (BUSPAR) 7.5 MG tablet Take 1 tablet (7.5 mg total) by mouth 2 (two) times daily. 180 tablet 1  . Cholecalciferol (VITAMIN D3) 1000 UNITS tablet Take 1,000 Units by mouth daily.      [name] diltiazem (CARDIZEM CD) 120 MG 24 hr capsule TAKE 1 CAPSULE (120 MG TOTAL) BY MOUTH DAILY. 90 capsule 3  . doxycycline (VIBRA-TABS) 100 MG tablet Take 1 tablet (100 mg total) by mouth 2 (two) times daily. 60 tablet 5  . Ferrous Sulfate (IRON) 325 (65 FE) MG TABS Take 1 tablet by mouth daily.    . furosemide (LASIX) 40 MG tablet Take 1 tablet (40 mg total) by mouth as directed. 1 po on Mon-Wed-Fri in am 90 tablet 1  . levothyroxine (SYNTHROID, LEVOTHROID) 25 MCG tablet TAKE 1 TABLET BY MOUTH ONCE DAILY FOR THYROID 90 tablet 1  . linaclotide (LINZESS) 290 MCG CAPS capsule Take 1 capsule (290 mcg total) by mouth daily. 90 capsule 3  . metoprolol succinate (TOPROL-XL) 50 MG 24 hr tablet Take 1 tablet (50 mg total) by mouth 2 (two) times daily. Take with or immediately following a meal. 180 tablet 3  . Multiple Vitamins-Minerals (MULTIVITAMIN WITH MINERALS) tablet Take 1 tablet by mouth daily.    [name] omeprazole (PRILOSEC) 20 MG capsule Take 20 mg by  mouth daily as needed (acid reflux).     [name] oxybutynin (DITROPAN) 5 MG tablet Take 1 tablet (5 mg total) by mouth 2 (two) times daily. Yearly physical due in June must see MD for refills 180 tablet 1  . polycarbophil (FIBERCON) 625 MG tablet Take 1 tablet (625 mg total) by mouth daily. 30 tablet 0  . polyethylene glycol powder (GLYCOLAX/MIRALAX) powder Take 17-34 g by mouth 2 (two) times daily as needed for moderate constipation. 850 g 5  . potassium chloride SA (K-DUR,KLOR-CON) 20 MEQ tablet Take 1 tablet (20 mEq total) by mouth daily. 90 tablet 3  . temazepam (RESTORIL) 30 MG capsule Take 1 capsule (30 mg total) by mouth at bedtime. 30 capsule 5  . traMADol (ULTRAM) 50 MG tablet Take 1-2 tablets (50-100 mg total) by mouth every 6 (six) hours as needed. (Patient taking differently: Take 50-100 mg by mouth every 6 (six) hours as needed for moderate pain. ) 10 tablet 0  . vitamin C (ASCORBIC ACID) 500 MG tablet Take 500 mg by mouth daily.    [name] zinc sulfate (ZINC-220) 220 (50 Zn) MG capsule Take 220 mg by mouth daily.    . bisacodyl (DULCOLAX) 10 MG suppository Place 1 suppository (10 mg total) rectally as needed  for moderate constipation. 12 suppository 0  . docusate sodium (COLACE) 100 MG capsule Take 1 capsule (100 mg total) by mouth every 12 (twelve) hours. 60 capsule 0   No facility-administered medications prior to visit.     ROS Review of Systems  Constitutional: Negative for activity change, appetite change, chills, fatigue and unexpected weight change.  HENT: Negative for congestion, mouth sores and sinus pressure.   Eyes: Negative for visual disturbance.  Respiratory: Negative for cough and chest tightness.   Gastrointestinal: Negative for abdominal pain and nausea.  Genitourinary: Negative for difficulty urinating, frequency and vaginal pain.  Musculoskeletal: Positive for back pain and gait problem.  Skin: Negative for pallor and rash.  Neurological: Negative for dizziness, tremors,  weakness, numbness and headaches.  Psychiatric/Behavioral: Negative for confusion and sleep disturbance.    Objective:  BP 122/60 (BP Location: Left Arm, Patient Position: Sitting, Cuff Size: Large)   Pulse 74   Temp 98.4 F (36.9 C) (Oral)   Ht 5' 5.4" (1.661 m)   Wt 179 lb (81.2 kg)   SpO2 98%   BMI 29.42 kg/m   BP Readings from Last 3 Encounters:  08/22/16 122/60  07/28/16 127/62  07/03/16 130/84    Wt Readings from Last 3 Encounters:  08/22/16 179 lb (81.2 kg)  07/28/16 178 lb 1.6 oz (80.8 kg)  07/03/16 174 lb (78.9 kg)    Physical Exam  Constitutional: She appears well-developed. No distress.  HENT:  Head: Normocephalic.  Right Ear: External ear normal.  Left Ear: External ear normal.  Nose: Nose normal.  Mouth/Throat: Oropharynx is clear and moist.  Eyes: Pupils are equal, round, and reactive to light. Conjunctivae are normal. Right eye exhibits no discharge. Left eye exhibits no discharge.  Neck: Normal range of motion. Neck supple. No JVD present. No tracheal deviation present. No thyromegaly present.  Cardiovascular: Normal rate, regular rhythm and normal heart sounds.   Pulmonary/Chest: No stridor. No respiratory distress. She has no wheezes.  Abdominal: Soft. Bowel sounds are normal. She exhibits no distension and no mass. There is no tenderness. There is no rebound and no guarding.  Musculoskeletal: She exhibits tenderness. She exhibits no edema.  Lymphadenopathy:    She has no cervical adenopathy.  Neurological: She displays normal reflexes. No cranial nerve deficit. She exhibits normal muscle tone. Coordination abnormal.  Skin: No rash noted. No erythema.  Psychiatric: She has a normal mood and affect. Her behavior is normal. Judgment and thought content normal.  walker  Lab Results  Component Value Date   WBC 5.7 07/28/2016   HGB 13.9 07/28/2016   HCT 41.9 07/28/2016   PLT 215 07/28/2016   GLUCOSE 160 (H) 07/28/2016   CHOL 116 06/25/2012   TRIG  82 09/05/2013   HDL 41.50 06/25/2012   LDLDIRECT 110.3 05/24/2008   LDLCALC 66 06/25/2012   ALT 24 07/28/2016   AST 21 07/28/2016   NA 137 07/28/2016   K 3.8 07/28/2016   CL 102 06/13/2016   CREATININE 1.0 07/28/2016   BUN 9.5 07/28/2016   CO2 28 07/28/2016   TSH 0.54 04/21/2016   INR 1.7 05/01/2016   HGBA1C 6.1 (H) 09/08/2013    Mr Lumbar Spine W Wo Contrast  Result Date: 07/16/2016 CLINICAL DATA:  Acute midline low back pain with sciatica Creatinine was obtained on site at [facility] at [address] Results: Creatinine 0.9 mg/dL. EXAM: MRI LUMBAR SPINE WITHOUT AND WITH CONTRAST TECHNIQUE: Multiplanar and multiecho pulse sequences of the lumbar spine  were obtained without and with intravenous contrast. CONTRAST:  84mL MULTIHANCE GADOBENATE DIMEGLUMINE 529 MG/ML IV SOLN COMPARISON:  No prior MRI.  CT abdomen pelvis 06/13/2016 FINDINGS: Segmentation:  Normal Alignment: Slight anterolisthesis L2-3. 5 mm anterolisthesis L3-4. Mild anterolisthesis L4-5 Vertebrae: Mild compression fracture of T12 with bone marrow edema. Mild edema in the superior endplate of L1. These lesions are most likely due to benign fractures. These areas show diffuse enhancement and were not present on the recent CT. Conus medullaris: Extends to the L1-2 level and appears normal. Paraspinal and other soft tissues: No mass or adenopathy Disc levels: T11-12:  Mild disc and facet degeneration T12-L1:  Mild disc degeneration without stenosis L1-2:  Mild disc bulging and facet degeneration without stenosis L2-3: Diffuse bulging of the disc. Bilateral facet hypertrophy. Mild foraminal narrowing bilaterally. L3-4: Solid interbody fusion. 5 mm anterolisthesis. Disc degeneration and spurring. Bilateral facet degeneration. Moderately severe spinal stenosis. Subarticular stenosis bilaterally. L4-5: Solid interbody fusion. Mild anterolisthesis. Mild subarticular and foraminal narrowing on the left due to spurring L5-S1: Disc  degeneration with large left lateral osteophyte. No significant foraminal encroachment. IMPRESSION: Mild compression fracture of T12 which appears recent. Mild bone marrow edema enhancement L1 vertebral body consistent with contusion. No retropulsion of bone into the canal. Moderate spinal stenosis L3-4. Solid interbody fusion L3-4 and L4-5. Electronically Signed   By: Franchot Gallo M.D.   On: 07/16/2016 15:12    Assessment & Plan:   There are no diagnoses linked to this encounter. I have discontinued Ms. Dingley bisacodyl and docusate sodium. I am also having her maintain her cholecalciferol, B-complex with vitamin C, Iron, acetaminophen, vitamin C, omeprazole, multivitamin with minerals, traMADol, diltiazem, metoprolol succinate, temazepam, oxybutynin, furosemide, apixaban, potassium chloride SA, atorvastatin, zinc sulfate, polycarbophil, polyethylene glycol powder, linaclotide, doxycycline, busPIRone, anastrozole, and levothyroxine.  No orders of the defined types were placed in this encounter.    Follow-up: No Follow-up on file.  Walker Kehr, MD

## 2016-10-03 ENCOUNTER — Telehealth: Payer: Self-pay

## 2016-10-03 NOTE — Telephone Encounter (Signed)
Insurance has been verified for prolia injections, estimated $0 copay for prolia ---advised patient, she will discuss and possibly get in office visit with dr plotnikov on oct 3rd

## 2016-10-13 ENCOUNTER — Telehealth: Payer: Self-pay | Admitting: Internal Medicine

## 2016-10-13 MED ORDER — DILTIAZEM HCL ER COATED BEADS 120 MG PO CP24: ORAL_CAPSULE | ORAL | 1 refills | Status: DC

## 2016-10-13 NOTE — Telephone Encounter (Signed)
Per chart pt is current sent rx to [facility];mb

## 2016-10-13 NOTE — Telephone Encounter (Signed)
Pharmacy called for a refill of the diltiazem (CARDIZEM CD) 120 MG 24 hr capsule Please advise   Please send to  [place], [place]

## 2016-10-15 ENCOUNTER — Encounter: Payer: Self-pay | Admitting: Cardiovascular Disease

## 2016-10-21 NOTE — Progress Notes (Addendum)
Subjective:   [name] is a 83 y.o. female who presents for Medicare Annual (Subsequent) preventive examination.  Review of Systems:  No ROS.  Medicare Wellness Visit. Additional risk factors are reflected in the social history.  Cardiac Risk Factors include: advanced age (>34men, >75 women);hypertension Sleep patterns: feels rested on waking, gets up 1-2 times nightly to void and sleeps 8-9 hours nightly.    Home Safety/Smoke Alarms: Feels safe in home. Smoke alarms in place.  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 Seat Belt Safety/Bike Helmet: Wears seat belt.      Objective:     Vitals: BP 124/72 (BP Location: Left Arm, Patient Position: Sitting, Cuff Size: Large)   Pulse 65   Temp 98.3 F (36.8 C) (Oral)   Ht 5' 5.4" (1.661 m)   Wt 179 lb (81.2 kg)   SpO2 98%   BMI 29.42 kg/m   Body mass index is 29.42 kg/m.   Tobacco History  Smoking Status  . Never Smoker  Smokeless Tobacco  . Never Used     Counseling given: Not Answered   Past Medical History:  Diagnosis Date  . Anticoagulated on Coumadin   . Anxiety    denies  . Bilateral edema of lower extremity   . Cancer ([place])    left breast  . Chronic constipation   . Diverticulosis of colon   . GERD (gastroesophageal reflux disease)   . H/O hiatal hernia   . History of cellulitis    LEFT LOWER LEG --  SEPT 2015  . History of colitis    DX  2003  WITH ISCHEMIC COLITIS  . History of diverticulitis of colon    perferated diverticulitis w/ abscess 08-25-2013 drain placed --  resolved without surgical intervention  . History of GI bleed    2011--- UPPER SECONARY GASTRIC ULCER FROM NSAID USE  . History of Helicobacter pylori infection    2011  . History of ulcer of lower limb    2012   RIGHT LOWER EXTREMITIY--  STATSIS ULCER    . Hyperlipidemia   . Hypertension   . Hypothyroidism   . Iron deficiency anemia 2011   elevated MCV  . LBP (low back pain)    severe lumbar spondylosis s/p L3/L4,L4/L5 fusion 12/10  . Lumbar spondylolysis   . OA (osteoarthritis)    "hands" (08/25/2013)  . Open wound of left lower leg   . Osteopenia   . Persistent atrial fibrillation ([place])    CARDIOLOGIST-   DR MCALANY  . RBBB    at fib  . Urinary frequency    Past Surgical History:  Procedure Laterality Date  . APPLICATION OF A-CELL OF EXTREMITY Left 06/21/2013   Procedure: APPLICATION OF A-CELL OF EXTREMITY;  Surgeon: Theodoro Kos, DO;  Location: [facility];  Service: Plastics;  Laterality: Left;  . APPLICATION OF A-CELL OF EXTREMITY Left 08/22/2013   Procedure: APPLICATION OF A-CELL/VAC TO LOWER LEFT LEG WOUND;  Surgeon: Theodoro Kos, DO;  Location: [facility];  Service: Plastics;  Laterality: Left;  . APPLICATION OF A-CELL OF EXTREMITY Left 02/06/2014   Procedure: WITH PLACEMENT OF A -CELL ;  Surgeon: Theodoro Kos, DO;  Location: [facility];  Service: Plastics;  Laterality: Left;  . APPLICATION OF A-CELL OF EXTREMITY Left 08/09/2014   Procedure: PLACEMENT OF A CELL;  Surgeon: Lyndee Leo  Sanger, DO;  Location: [facility];  Service: Clinical cytogeneticist;  Laterality: Left;  . APPLICATION OF WOUND VAC Left 06/16/2013   Procedure: APPLICATION OF WOUND VAC;  Surgeon: Meredith Pel, MD;  Location: [facility];  Service: Orthopedics;  Laterality: Left;  . APPLICATION OF WOUND VAC Left 06/21/2013   Procedure: APPLICATION OF WOUND VAC;  Surgeon: Theodoro Kos, DO;  Location: [facility];  Service: Plastics;  Laterality: Left;  . BIOPSY BREAST  07/25/2015   LEFT RADIOACTIVE SEED GUIDED EXCISIONAL BREAST BIOPSY (Left) as a surgical intervention  . EXTERNAL FIXATION LEG Left 06/16/2013   Procedure: EXTERNAL FIXATION LEG;  Surgeon: Meredith Pel, MD;  Location: [facility];  Service: Orthopedics;  Laterality: Left;  . EXTERNAL FIXATION REMOVAL Left  06/21/2013   Procedure: REMOVAL EXTERNAL FIXATION LEG;  Surgeon: Meredith Pel, MD;  Location: [facility];  Service: Orthopedics;  Laterality: Left;  . EYE SURGERY Bilateral    cataract removal  . HARDWARE REMOVAL Left 05/23/2014   Procedure: HARDWARE REMOVAL;  Surgeon: Meredith Pel, MD;  Location: [facility];  Service: Orthopedics;  Laterality: Left;  REMOVAL OF HARDWARE LEFT TIBIA  . I&D EXTREMITY Left 06/16/2013   Procedure: IRRIGATION AND DEBRIDEMENT EXTREMITY;  Surgeon: Meredith Pel, MD;  Location: [facility];  Service: Orthopedics;  Laterality: Left;  . I&D EXTREMITY Left 02/06/2014   Procedure: IRRIGATION AND DEBRIDEMENT LEFT LEG WOUND ;  Surgeon: Theodoro Kos, DO;  Location: [facility];  Service: Plastics;  Laterality: Left;  . I&D EXTREMITY Left 08/09/2014   Procedure: IRRIGATION AND DEBRIDEMENT  OF LEFT LOWER LEG;  Surgeon: Theodoro Kos, DO;  Location: [facility];  Service: Plastics;  Laterality: Left;  . LUMBAR FUSION  01-02-2009   L3-L5  . RADIOACTIVE SEED GUIDED EXCISIONAL BREAST BIOPSY Left 07/25/2015   Procedure: LEFT RADIOACTIVE SEED GUIDED EXCISIONAL BREAST BIOPSY;  Surgeon: Rolm Bookbinder, MD;  Location: [facility];  Service: General;  Laterality: Left;  . RE-EXCISION OF BREAST LUMPECTOMY Left 09/05/2015   Procedure: RE-EXCISION OF LEFT BREAST LUMPECTOMY;  Surgeon: Rolm Bookbinder, MD;  Location: [facility];  Service: General;  Laterality: Left;  . SHOULDER OPEN ROTATOR CUFF REPAIR Left 1997  . TIBIA IM NAIL INSERTION Left 06/21/2013   Procedure: INTRAMEDULLARY (IM) NAIL TIBIAL;  Surgeon: Meredith Pel, MD;  Location: [facility];  Service: Orthopedics;  Laterality: Left;  . TONSILLECTOMY  AS CHILD  . TOTAL HIP ARTHROPLASTY Right 06-07-2010  . TRANSTHORACIC ECHOCARDIOGRAM  09-11-2010   DR MCALHANY   LVSF  55-60%/  MILD MV CALCIFICATION WITH NO STENOSIS/  MILD MR & TR / MODERATE LAE/  MODERATE TO SEVERE RAE  . UPPER GI ENDOSCOPY  03-29-2010   Family History  Problem Relation  Age of Onset  . Cancer Father   . Hypertension Other    History  Sexual Activity  . Sexual activity: Not on file    Outpatient Encounter Prescriptions as of 10/22/2016  Medication Sig  . acetaminophen (TYLENOL) 500 MG tablet Take 500 mg by mouth daily as needed for mild pain.   [name] anastrozole (ARIMIDEX) 1 MG tablet Take 1 tablet (1 mg total) by mouth daily.  [name] apixaban (ELIQUIS) 2.5 MG TABS tablet Take 1 tablet (2.5 mg total) by mouth 2 (two) times daily.  [name] atorvastatin (LIPITOR) 10 MG tablet Take 1 tablet (10 mg total) by mouth every evening.  . B Complex-C (B-COMPLEX WITH VITAMIN C) tablet Take 1 tablet by mouth daily.  . busPIRone (BUSPAR) 7.5 MG tablet  Take 1 tablet (7.5 mg total) by mouth 2 (two) times daily.  . Cholecalciferol (VITAMIN D3) 1000 UNITS tablet Take 1,000 Units by mouth daily.    [name] diltiazem (CARDIZEM CD) 120 MG 24 hr capsule TAKE 1 CAPSULE (120 MG TOTAL) BY MOUTH DAILY.  [name] doxycycline (VIBRA-TABS) 100 MG tablet Take 1 tablet (100 mg total) by mouth 2 (two) times daily.  . Ferrous Sulfate (IRON) 325 (65 FE) MG TABS Take 1 tablet by mouth daily.  . furosemide (LASIX) 40 MG tablet Take 1 tablet (40 mg total) by mouth as directed. 1 po on Mon-Wed-Fri in am  . levothyroxine (SYNTHROID, LEVOTHROID) 25 MCG tablet TAKE 1 TABLET BY MOUTH ONCE DAILY FOR THYROID  . linaclotide (LINZESS) 290 MCG CAPS capsule Take 1 capsule (290 mcg total) by mouth daily.  . metoprolol succinate (TOPROL-XL) 50 MG 24 hr tablet Take 1 tablet (50 mg total) by mouth 2 (two) times daily. Take with or immediately following a meal.  . Multiple Vitamins-Minerals (MULTIVITAMIN WITH MINERALS) tablet Take 1 tablet by mouth daily.  [name] omeprazole (PRILOSEC) 20 MG capsule Take 20 mg by mouth daily as needed (acid reflux).   [name] oxybutynin (DITROPAN) 5 MG tablet Take 1 tablet (5 mg total) by mouth 2 (two) times daily. Yearly physical due in June must see MD for refills  . polyethylene glycol powder (GLYCOLAX/MIRALAX)  powder Take 17-34 g by mouth 2 (two) times daily as needed for moderate constipation.  . potassium chloride SA (K-DUR,KLOR-CON) 20 MEQ tablet Take 1 tablet (20 mEq total) by mouth daily.  . temazepam (RESTORIL) 30 MG capsule Take 1 capsule (30 mg total) by mouth at bedtime.  . traMADol (ULTRAM) 50 MG tablet Take 1-2 tablets (50-100 mg total) by mouth every 6 (six) hours as needed. (Patient taking differently: Take 50-100 mg by mouth every 6 (six) hours as needed for moderate pain. )  . vitamin C (ASCORBIC ACID) 500 MG tablet Take 500 mg by mouth daily.  [name] zinc sulfate (ZINC-220) 220 (50 Zn) MG capsule Take 220 mg by mouth daily.  . [EXPIRED] denosumab (PROLIA) injection 60 mg    No facility-administered encounter medications on file as of 10/22/2016.     Activities of Daily Living In your present state of health, do you have any difficulty performing the following activities: 10/22/2016  Hearing? N  Vision? N  Difficulty concentrating or making decisions? Y  Walking or climbing stairs? Y  Dressing or bathing? N  Doing errands, shopping? Y  Preparing Food and eating ? Y  Using the Toilet? N  In the past six months, have you accidently leaked urine? N  Do you have problems with loss of bowel control? N  Managing your Medications? N  Managing your Finances? N  Housekeeping or managing your Housekeeping? Y  Some recent data might be hidden    Patient Care Team: [name], [name], MD as PCP - General Rolm Bookbinder, MD as Consulting Physician (General Surgery) Magrinat, Virgie Dad, MD as Consulting Physician (Oncology) Burnell Blanks, MD as Consulting Physician (Cardiology) Jarome Matin, MD as Consulting Physician (Dermatology) Marlou Sa Tonna Corner, MD as Consulting Physician (Orthopedic Surgery)    Assessment:    Physical assessment deferred to PCP.  Exercise Activities and Dietary recommendations Current Exercise Habits: Structured exercise class, Type of exercise:  yoga;calisthenics;strength training/weights, Time (Minutes): 35, Frequency (Times/Week): 2, Weekly Exercise (Minutes/Week): 70, Exercise limited by: orthopedic condition(s)  Diet (meal preparation, eat out, water intake, caffeinated beverages, dairy products, fruits  and vegetables): in general, a "healthy" diet  , well balanced   Encouraged patient to increase daily water intake. Discussed supplementing with ensure when appetite is decreased.   Goals    . Stay as healthy and as independent as possible          Enjoy social activities at [facility] and continue to the gym.      Fall Risk Fall Risk  10/22/2016 08/22/2016 04/16/2015 10/16/2014 03/29/2014  Falls in the past year? No No No No Yes  Number falls in past yr: - - - - 1  Risk for fall due to : Impaired mobility;Impaired balance/gait - - - -   Depression Screen PHQ 2/9 Scores 10/22/2016 08/22/2016 04/16/2015 03/29/2014  PHQ - 2 Score 2 0 0 0  PHQ- 9 Score 3 - - -     Cognitive Function MMSE - Mini Mental State Exam 10/22/2016  Orientation to time 5  Orientation to Place 5  Registration 3  Attention/ Calculation 4  Recall 1  Language- name 2 objects 2  Language- repeat 1  Language- follow 3 step command 3  Language- read & follow direction 1  Write a sentence 1  Copy design 1  Total score 27        Immunization History  Administered Date(s) Administered  . H1N1 02/02/2008  . Influenza Split 11/05/2010  . Influenza Whole 11/26/2005, 10/23/2009, 10/21/2011  . Influenza, High Dose Seasonal PF 10/21/2015, 10/14/2016  . Influenza,inj,Quad PF,6+ Mos 10/16/2014  . Influenza-Unspecified 11/15/2012, 10/20/2013, 10/22/2016  . Pneumococcal Conjugate-13 02/23/2013  . Pneumococcal Polysaccharide-23 11/26/2005  . Td 07/21/2012  . Tdap 06/16/2013   Screening Tests Health Maintenance  Topic Date Due  . TETANUS/TDAP  06/17/2023  . INFLUENZA VACCINE  Completed  . DEXA SCAN  Completed  . PNA vac Low Risk Adult  Completed        Plan:  Continue doing brain stimulating activities (puzzles, reading, adult coloring books, staying active) to keep memory sharp.   Continue to eat heart healthy diet (full of fruits, vegetables, whole grains, lean protein, water--limit salt, fat, and sugar intake) and increase physical activity as tolerated.  I have personally reviewed and noted the following in the patient's chart:   . Medical and social history . Use of alcohol, tobacco or illicit drugs  . Current medications and supplements . Functional ability and status . Nutritional status . Physical activity . Advanced directives . List of other physicians . Vitals . Screenings to include cognitive, depression, and falls . Referrals and appointments  In addition, I have reviewed and discussed with patient certain preventive protocols, quality metrics, and best practice recommendations. A written personalized care plan for preventive services as well as general preventive health recommendations were provided to patient.     Michiel Cowboy, RN  10/22/2016  Medical screening examination/treatment/procedure(s) were performed by non-physician practitioner and as supervising physician I was immediately available for consultation/collaboration. I agree with above. Lew Dawes, MD

## 2016-10-21 NOTE — Progress Notes (Signed)
Pre visit review using our clinic review tool, if applicable. No additional management support is needed unless otherwise documented below in the visit note. 

## 2016-10-22 ENCOUNTER — Encounter: Payer: Self-pay | Admitting: Internal Medicine

## 2016-10-22 ENCOUNTER — Other Ambulatory Visit (INDEPENDENT_AMBULATORY_CARE_PROVIDER_SITE_OTHER): Payer: Medicare Other

## 2016-10-22 ENCOUNTER — Ambulatory Visit (INDEPENDENT_AMBULATORY_CARE_PROVIDER_SITE_OTHER): Payer: Medicare Other | Admitting: Internal Medicine

## 2016-10-22 VITALS — BP 124/72 | HR 65 | Temp 98.3°F | Ht 65.4 in | Wt 179.0 lb

## 2016-10-22 DIAGNOSIS — Z0000 Encounter for general adult medical examination without abnormal findings: Secondary | ICD-10-CM

## 2016-10-22 DIAGNOSIS — K5901 Slow transit constipation: Secondary | ICD-10-CM | POA: Diagnosis not present

## 2016-10-22 DIAGNOSIS — E039 Hypothyroidism, unspecified: Secondary | ICD-10-CM | POA: Diagnosis not present

## 2016-10-22 DIAGNOSIS — I10 Essential (primary) hypertension: Secondary | ICD-10-CM

## 2016-10-22 DIAGNOSIS — M1990 Unspecified osteoarthritis, unspecified site: Secondary | ICD-10-CM | POA: Diagnosis not present

## 2016-10-22 DIAGNOSIS — M8000XS Age-related osteoporosis with current pathological fracture, unspecified site, sequela: Secondary | ICD-10-CM

## 2016-10-22 DIAGNOSIS — M5440 Lumbago with sciatica, unspecified side: Secondary | ICD-10-CM | POA: Diagnosis not present

## 2016-10-22 LAB — BASIC METABOLIC PANEL
BUN: 14 mg/dL (ref 6–23)
CO2: 28 mEq/L (ref 19–32)
Calcium: 9.9 mg/dL (ref 8.4–10.5)
Chloride: 98 mEq/L (ref 96–112)
Creatinine, Ser: 1.13 mg/dL (ref 0.40–1.20)
GFR: 48.19 mL/min — ABNORMAL LOW (ref >60.00)
Glucose, Bld: 123 mg/dL — ABNORMAL HIGH (ref 70–99)
Potassium: 4.7 mEq/L (ref 3.5–5.1)
Sodium: 135 mEq/L (ref 135–145)

## 2016-10-22 MED ORDER — DENOSUMAB 60 MG/ML ~~LOC~~ SOLN
60.0000 mg | Freq: Once | SUBCUTANEOUS | Status: AC
  Administered 2016-10-22: 60 mg via SUBCUTANEOUS

## 2016-10-22 NOTE — Assessment & Plan Note (Signed)
Walker

## 2016-10-22 NOTE — Assessment & Plan Note (Signed)
On Levothroid 

## 2016-10-22 NOTE — Assessment & Plan Note (Signed)
Tylenol prn Walker 

## 2016-10-22 NOTE — Progress Notes (Signed)
Subjective:  Patient ID: [name], female    DOB: 16-Apr-1925  Age: 83 y.o. MRN: [id number]  CC: No chief complaint on file.   HPI [name] presents for A fib, LBP, hypothyroidism f/u  Outpatient Medications Prior to Visit  Medication Sig Dispense Refill  . acetaminophen (TYLENOL) 500 MG tablet Take 500 mg by mouth daily as needed for mild pain.     [name] anastrozole (ARIMIDEX) 1 MG tablet Take 1 tablet (1 mg total) by mouth daily. 90 tablet 3  . apixaban (ELIQUIS) 2.5 MG TABS tablet Take 1 tablet (2.5 mg total) by mouth 2 (two) times daily. 180 tablet 3  . atorvastatin (LIPITOR) 10 MG tablet Take 1 tablet (10 mg total) by mouth every evening. 90 tablet 1  . B Complex-C (B-COMPLEX WITH VITAMIN C) tablet Take 1 tablet by mouth daily.    . busPIRone (BUSPAR) 7.5 MG tablet Take 1 tablet (7.5 mg total) by mouth 2 (two) times daily. 180 tablet 1  . Cholecalciferol (VITAMIN D3) 1000 UNITS tablet Take 1,000 Units by mouth daily.      [name] diltiazem (CARDIZEM CD) 120 MG 24 hr capsule TAKE 1 CAPSULE (120 MG TOTAL) BY MOUTH DAILY. 90 capsule 1  . doxycycline (VIBRA-TABS) 100 MG tablet Take 1 tablet (100 mg total) by mouth 2 (two) times daily. 60 tablet 5  . Ferrous Sulfate (IRON) 325 (65 FE) MG TABS Take 1 tablet by mouth daily.    . furosemide (LASIX) 40 MG tablet Take 1 tablet (40 mg total) by mouth as directed. 1 po on Mon-Wed-Fri in am 90 tablet 1  . levothyroxine (SYNTHROID, LEVOTHROID) 25 MCG tablet TAKE 1 TABLET BY MOUTH ONCE DAILY FOR THYROID 90 tablet 1  . linaclotide (LINZESS) 290 MCG CAPS capsule Take 1 capsule (290 mcg total) by mouth daily. 90 capsule 3  . metoprolol succinate (TOPROL-XL) 50 MG 24 hr tablet Take 1 tablet (50 mg total) by mouth 2 (two) times daily. Take with or immediately following a meal. 180 tablet 3  . Multiple Vitamins-Minerals (MULTIVITAMIN WITH MINERALS) tablet Take 1 tablet by mouth daily.    [name] omeprazole (PRILOSEC) 20 MG capsule Take 20 mg by mouth daily as  needed (acid reflux).     [name] oxybutynin (DITROPAN) 5 MG tablet Take 1 tablet (5 mg total) by mouth 2 (two) times daily. Yearly physical due in June must see MD for refills 180 tablet 1  . polyethylene glycol powder (GLYCOLAX/MIRALAX) powder Take 17-34 g by mouth 2 (two) times daily as needed for moderate constipation. 850 g 5  . potassium chloride SA (K-DUR,KLOR-CON) 20 MEQ tablet Take 1 tablet (20 mEq total) by mouth daily. 90 tablet 3  . temazepam (RESTORIL) 30 MG capsule Take 1 capsule (30 mg total) by mouth at bedtime. 30 capsule 5  . traMADol (ULTRAM) 50 MG tablet Take 1-2 tablets (50-100 mg total) by mouth every 6 (six) hours as needed. (Patient taking differently: Take 50-100 mg by mouth every 6 (six) hours as needed for moderate pain. ) 10 tablet 0  . vitamin C (ASCORBIC ACID) 500 MG tablet Take 500 mg by mouth daily.    [name] zinc sulfate (ZINC-220) 220 (50 Zn) MG capsule Take 220 mg by mouth daily.     No facility-administered medications prior to visit.     ROS Review of Systems  Constitutional: Negative for activity change, appetite change, chills, fatigue and unexpected weight change.  HENT: Negative for congestion, mouth sores and  sinus pressure.   Eyes: Negative for visual disturbance.  Respiratory: Negative for cough and chest tightness.   Gastrointestinal: Negative for abdominal pain and nausea.  Genitourinary: Negative for difficulty urinating, frequency and vaginal pain.  Musculoskeletal: Positive for back pain and gait problem.  Skin: Negative for pallor and rash.  Neurological: Negative for dizziness, tremors, weakness, numbness and headaches.  Psychiatric/Behavioral: Negative for confusion and sleep disturbance.    Objective:  BP 124/72 (BP Location: Left Arm, Patient Position: Sitting, Cuff Size: Large)   Pulse 65   Temp 98.3 F (36.8 C) (Oral)   Ht 5' 5.4" (1.661 m)   Wt 179 lb (81.2 kg)   SpO2 98%   BMI 29.42 kg/m   BP Readings from Last 3 Encounters:    10/22/16 124/72  08/22/16 122/60  07/28/16 127/62    Wt Readings from Last 3 Encounters:  10/22/16 179 lb (81.2 kg)  08/22/16 179 lb (81.2 kg)  07/28/16 178 lb 1.6 oz (80.8 kg)    Physical Exam  Constitutional: She appears well-developed. No distress.  HENT:  Head: Normocephalic.  Right Ear: External ear normal.  Left Ear: External ear normal.  Nose: Nose normal.  Mouth/Throat: Oropharynx is clear and moist.  Eyes: Pupils are equal, round, and reactive to light. Conjunctivae are normal. Right eye exhibits no discharge. Left eye exhibits no discharge.  Neck: Normal range of motion. Neck supple. No JVD present. No tracheal deviation present. No thyromegaly present.  Cardiovascular: Normal rate, regular rhythm and normal heart sounds.   Pulmonary/Chest: No stridor. No respiratory distress. She has no wheezes.  Abdominal: Soft. Bowel sounds are normal. She exhibits no distension and no mass. There is no tenderness. There is no rebound and no guarding.  Musculoskeletal: She exhibits tenderness. She exhibits no edema.  Lymphadenopathy:    She has no cervical adenopathy.  Neurological: She displays normal reflexes. No cranial nerve deficit. She exhibits normal muscle tone. Coordination abnormal.  Skin: No rash noted. No erythema.  Psychiatric: She has a normal mood and affect. Her behavior is normal. Judgment and thought content normal.  walker  Lab Results  Component Value Date   WBC 5.7 07/28/2016   HGB 13.9 07/28/2016   HCT 41.9 07/28/2016   PLT 215 07/28/2016   GLUCOSE 160 (H) 07/28/2016   CHOL 116 06/25/2012   TRIG 82 09/05/2013   HDL 41.50 06/25/2012   LDLDIRECT 110.3 05/24/2008   LDLCALC 66 06/25/2012   ALT 24 07/28/2016   AST 21 07/28/2016   NA 137 07/28/2016   K 3.8 07/28/2016   CL 102 06/13/2016   CREATININE 1.0 07/28/2016   BUN 9.5 07/28/2016   CO2 28 07/28/2016   TSH 0.54 04/21/2016   INR 1.7 05/01/2016   HGBA1C 6.1 (H) 09/08/2013    Mr Lumbar Spine W  Wo Contrast  Result Date: 07/16/2016 CLINICAL DATA:  Acute midline low back pain with sciatica Creatinine was obtained on site at [facility] at [address] Results: Creatinine 0.9 mg/dL. EXAM: MRI LUMBAR SPINE WITHOUT AND WITH CONTRAST TECHNIQUE: Multiplanar and multiecho pulse sequences of the lumbar spine were obtained without and with intravenous contrast. CONTRAST:  72mL MULTIHANCE GADOBENATE DIMEGLUMINE 529 MG/ML IV SOLN COMPARISON:  No prior MRI.  CT abdomen pelvis 06/13/2016 FINDINGS: Segmentation:  Normal Alignment: Slight anterolisthesis L2-3. 5 mm anterolisthesis L3-4. Mild anterolisthesis L4-5 Vertebrae: Mild compression fracture of T12 with bone marrow edema. Mild edema in the superior endplate of L1. These lesions are most likely due  to benign fractures. These areas show diffuse enhancement and were not present on the recent CT. Conus medullaris: Extends to the L1-2 level and appears normal. Paraspinal and other soft tissues: No mass or adenopathy Disc levels: T11-12:  Mild disc and facet degeneration T12-L1:  Mild disc degeneration without stenosis L1-2:  Mild disc bulging and facet degeneration without stenosis L2-3: Diffuse bulging of the disc. Bilateral facet hypertrophy. Mild foraminal narrowing bilaterally. L3-4: Solid interbody fusion. 5 mm anterolisthesis. Disc degeneration and spurring. Bilateral facet degeneration. Moderately severe spinal stenosis. Subarticular stenosis bilaterally. L4-5: Solid interbody fusion. Mild anterolisthesis. Mild subarticular and foraminal narrowing on the left due to spurring L5-S1: Disc degeneration with large left lateral osteophyte. No significant foraminal encroachment. IMPRESSION: Mild compression fracture of T12 which appears recent. Mild bone marrow edema enhancement L1 vertebral body consistent with contusion. No retropulsion of bone into the canal. Moderate spinal stenosis L3-4. Solid interbody fusion L3-4 and L4-5. Electronically Signed    By: Franchot Gallo M.D.   On: 07/16/2016 15:12    Assessment & Plan:   There are no diagnoses linked to this encounter. I am having Ms. Mccully maintain her cholecalciferol, B-complex with vitamin C, Iron, acetaminophen, vitamin C, omeprazole, multivitamin with minerals, traMADol, metoprolol succinate, oxybutynin, furosemide, apixaban, potassium chloride SA, atorvastatin, zinc sulfate, polyethylene glycol powder, linaclotide, doxycycline, busPIRone, anastrozole, levothyroxine, temazepam, and diltiazem.  No orders of the defined types were placed in this encounter.    Follow-up: No Follow-up on file.  Walker Kehr, MD

## 2016-10-22 NOTE — Assessment & Plan Note (Signed)
On Miralax

## 2016-10-22 NOTE — Assessment & Plan Note (Signed)
Metoprolol, Diltiazem

## 2016-10-22 NOTE — Patient Instructions (Addendum)
Take LINZESS on an empty stomach at least 30 minutes before your first meal of the day. Remember to take [facility] daily, as prescribed by your doctor, to proactively manage your symptoms.  Continue doing brain stimulating activities (puzzles, reading, adult coloring books, staying active) to keep memory sharp.   Continue to eat heart healthy diet (full of fruits, vegetables, whole grains, lean protein, water--limit salt, fat, and sugar intake) and increase physical activity as tolerated.   [name] , Thank you for taking time to come for your Medicare Wellness Visit. I appreciate your ongoing commitment to your health goals. Please review the following plan we discussed and let me know if I can assist you in the future.   These are the goals we discussed: Goals    . Stay as healthy and as independent as possible          Enjoy social activities at [facility] and continue to go to the gym.       This is a list of the screening recommended for you and due dates:  Health Maintenance  Topic Date Due  . Tetanus Vaccine  06/17/2023  . Flu Shot  Completed  . DEXA scan (bone density measurement)  Completed  . Pneumonia vaccines  Completed

## 2016-10-23 ENCOUNTER — Ambulatory Visit (INDEPENDENT_AMBULATORY_CARE_PROVIDER_SITE_OTHER): Payer: Medicare Other | Admitting: Cardiovascular Disease

## 2016-10-23 ENCOUNTER — Encounter: Payer: Self-pay | Admitting: Cardiovascular Disease

## 2016-10-23 VITALS — BP 126/70 | HR 71 | Ht 63.0 in | Wt 178.8 lb

## 2016-10-23 DIAGNOSIS — I10 Essential (primary) hypertension: Secondary | ICD-10-CM | POA: Diagnosis not present

## 2016-10-23 DIAGNOSIS — I481 Persistent atrial fibrillation: Secondary | ICD-10-CM | POA: Diagnosis not present

## 2016-10-23 DIAGNOSIS — I4819 Other persistent atrial fibrillation: Secondary | ICD-10-CM

## 2016-10-23 DIAGNOSIS — Z23 Encounter for immunization: Secondary | ICD-10-CM | POA: Diagnosis not present

## 2016-10-23 MED ORDER — APIXABAN 5 MG PO TABS: 5.0000 mg | ORAL_TABLET | Freq: Two times a day (BID) | ORAL | 2 refills | Status: DC

## 2016-10-23 NOTE — Progress Notes (Signed)
Chief Complaint  Patient presents with  . Follow-up    atrial fibrillation    History of Present Illness: 83 yo female with history of HTN, chronic low back pain, iron deficiency anemia and persistent atrial fibrillation/flutter who is here today for follow up. She is now on Eliquis. In 2011, while on coumadin, she had dark stools with anemia. Coumadin was held and upper endoscopy showed several small clean based gastric ulcers. She has chronic lower ext edema controlled with Lasix. She has left breast cancer and has had lumpectomy in July 2017.   She is here today for follow up. The patient denies any chest pain, dyspnea, palpitations, lower extremity edema, orthopnea, PND, dizziness, near syncope or syncope.    Primary Care Physician: [name], MD   Past Medical History:  Diagnosis Date  . Anticoagulated on Coumadin   . Anxiety    denies  . Bilateral edema of lower extremity   . Cancer ([place])    left breast  . Chronic constipation   . Diverticulosis of colon   . GERD (gastroesophageal reflux disease)   . H/O hiatal hernia   . History of cellulitis    LEFT LOWER LEG --  SEPT 2015  . History of colitis    DX  2003  WITH ISCHEMIC COLITIS  . History of diverticulitis of colon    perferated diverticulitis w/ abscess 08-25-2013 drain placed --  resolved without surgical intervention  . History of GI bleed    2011--- UPPER SECONARY GASTRIC ULCER FROM NSAID USE  . History of Helicobacter pylori infection    2011  . History of ulcer of lower limb    2012   RIGHT LOWER EXTREMITIY--  STATSIS ULCER  . Hyperlipidemia   . Hypertension   . Hypothyroidism   . Iron deficiency anemia 2011   elevated MCV  . LBP (low back pain)    severe lumbar spondylosis s/p L3/L4,L4/L5 fusion 12/10  . Lumbar spondylolysis   . OA (osteoarthritis)    "hands" (08/25/2013)  . Open wound of left lower leg   . Osteopenia   . Persistent atrial fibrillation ([place])    CARDIOLOGIST-   DR  MCALANY  . RBBB    at fib  . Urinary frequency     Past Surgical History:  Procedure Laterality Date  . APPLICATION OF A-CELL OF EXTREMITY Left 06/21/2013   Procedure: APPLICATION OF A-CELL OF EXTREMITY;  Surgeon: Theodoro Kos, DO;  Location: [place];  Service: Plastics;  Laterality: Left;  . APPLICATION OF A-CELL OF EXTREMITY Left 08/22/2013   Procedure: APPLICATION OF A-CELL/VAC TO LOWER LEFT LEG WOUND;  Surgeon: Theodoro Kos, DO;  Location: [place];  Service: Plastics;  Laterality: Left;  . APPLICATION OF A-CELL OF EXTREMITY Left 02/06/2014   Procedure: WITH PLACEMENT OF A -CELL ;  Surgeon: Theodoro Kos, DO;  Location: [facility];  Service: Plastics;  Laterality: Left;  . APPLICATION OF A-CELL OF EXTREMITY Left 08/09/2014   Procedure: PLACEMENT OF A CELL;  Surgeon: Theodoro Kos, DO;  Location: [place];  Service: Plastics;  Laterality: Left;  . APPLICATION OF WOUND VAC Left 06/16/2013   Procedure: APPLICATION OF WOUND VAC;  Surgeon: Meredith Pel, MD;  Location: [place];  Service: Orthopedics;  Laterality: Left;  . APPLICATION OF WOUND VAC Left 06/21/2013   Procedure: APPLICATION OF WOUND VAC;  Surgeon: Theodoro Kos, DO;  Location: [place];  Service: Plastics;  Laterality: Left;  . BIOPSY  BREAST  07/25/2015   LEFT RADIOACTIVE SEED GUIDED EXCISIONAL BREAST BIOPSY (Left) as a surgical intervention  . EXTERNAL FIXATION LEG Left 06/16/2013   Procedure: EXTERNAL FIXATION LEG;  Surgeon: Meredith Pel, MD;  Location: [place];  Service: Orthopedics;  Laterality: Left;  . EXTERNAL FIXATION REMOVAL Left 06/21/2013   Procedure: REMOVAL EXTERNAL FIXATION LEG;  Surgeon: Meredith Pel, MD;  Location: [place];  Service: Orthopedics;  Laterality: Left;  . EYE SURGERY Bilateral    cataract removal  . HARDWARE REMOVAL Left 05/23/2014   Procedure: HARDWARE REMOVAL;  Surgeon: Meredith Pel, MD;  Location: [facility];  Service: Orthopedics;  Laterality: Left;  REMOVAL OF  HARDWARE LEFT TIBIA  . I&D EXTREMITY Left 06/16/2013   Procedure: IRRIGATION AND DEBRIDEMENT EXTREMITY;  Surgeon: Meredith Pel, MD;  Location: [place];  Service: Orthopedics;  Laterality: Left;  . I&D EXTREMITY Left 02/06/2014   Procedure: IRRIGATION AND DEBRIDEMENT LEFT LEG WOUND ;  Surgeon: Theodoro Kos, DO;  Location: [place];  Service: Plastics;  Laterality: Left;  . I&D EXTREMITY Left 08/09/2014   Procedure: IRRIGATION AND DEBRIDEMENT  OF LEFT LOWER LEG;  Surgeon: Theodoro Kos, DO;  Location: [place];  Service: Plastics;  Laterality: Left;  . LUMBAR FUSION  01-02-2009   L3-L5  . RADIOACTIVE SEED GUIDED EXCISIONAL BREAST BIOPSY Left 07/25/2015   Procedure: LEFT RADIOACTIVE SEED GUIDED EXCISIONAL BREAST BIOPSY;  Surgeon: Rolm Bookbinder, MD;  Location: [place];  Service: General;  Laterality: Left;  . RE-EXCISION OF BREAST LUMPECTOMY Left 09/05/2015   Procedure: RE-EXCISION OF LEFT BREAST LUMPECTOMY;  Surgeon: Rolm Bookbinder, MD;  Location: [place];  Service: General;  Laterality: Left;  . SHOULDER OPEN ROTATOR CUFF REPAIR Left 1997  . TIBIA IM NAIL INSERTION Left 06/21/2013   Procedure: INTRAMEDULLARY (IM) NAIL TIBIAL;  Surgeon: Meredith Pel, MD;  Location: [place];  Service: Orthopedics;  Laterality: Left;  . TONSILLECTOMY  AS CHILD  . TOTAL HIP ARTHROPLASTY Right 06-07-2010  . TRANSTHORACIC ECHOCARDIOGRAM  09-11-2010   DR Kanya Potteiger   LVSF  55-60%/  MILD MV CALCIFICATION WITH NO STENOSIS/  MILD MR & TR / MODERATE LAE/  MODERATE TO SEVERE RAE  . UPPER GI ENDOSCOPY  03-29-2010    Current Outpatient Prescriptions  Medication Sig Dispense Refill  . acetaminophen (TYLENOL) 500 MG tablet Take 500 mg by mouth daily as needed for mild pain.     [name] anastrozole (ARIMIDEX) 1 MG tablet Take 1 tablet (1 mg total) by mouth daily. 90 tablet 3  . atorvastatin (LIPITOR) 10 MG tablet Take 1 tablet (10 mg total) by mouth every evening. 90 tablet 1  . B Complex-C (B-COMPLEX WITH  VITAMIN C) tablet Take 1 tablet by mouth daily.    . busPIRone (BUSPAR) 7.5 MG tablet Take 1 tablet (7.5 mg total) by mouth 2 (two) times daily. 180 tablet 1  . Cholecalciferol (VITAMIN D3) 1000 UNITS tablet Take 1,000 Units by mouth daily.      [name] diltiazem (CARDIZEM CD) 120 MG 24 hr capsule TAKE 1 CAPSULE (120 MG TOTAL) BY MOUTH DAILY. 90 capsule 1  . doxycycline (VIBRA-TABS) 100 MG tablet Take 1 tablet (100 mg total) by mouth 2 (two) times daily. 60 tablet 5  . Ferrous Sulfate (IRON) 325 (65 FE) MG TABS Take 1 tablet by mouth daily.    . furosemide (LASIX) 40 MG tablet Take 1 tablet (40 mg total) by mouth as directed. 1 po on Mon-Wed-Fri in am 90 tablet  1  . levothyroxine (SYNTHROID, LEVOTHROID) 25 MCG tablet TAKE 1 TABLET BY MOUTH ONCE DAILY FOR THYROID 90 tablet 1  . linaclotide (LINZESS) 290 MCG CAPS capsule Take 1 capsule (290 mcg total) by mouth daily. 90 capsule 3  . metoprolol succinate (TOPROL-XL) 50 MG 24 hr tablet Take 1 tablet (50 mg total) by mouth 2 (two) times daily. Take with or immediately following a meal. 180 tablet 3  . Multiple Vitamins-Minerals (MULTIVITAMIN WITH MINERALS) tablet Take 1 tablet by mouth daily.    [name] omeprazole (PRILOSEC) 20 MG capsule Take 20 mg by mouth daily as needed (acid reflux).     [name] oxybutynin (DITROPAN) 5 MG tablet Take 1 tablet (5 mg total) by mouth 2 (two) times daily. Yearly physical due in June must see MD for refills 180 tablet 1  . polyethylene glycol powder (GLYCOLAX/MIRALAX) powder Take 17-34 g by mouth 2 (two) times daily as needed for moderate constipation. 850 g 5  . potassium chloride SA (K-DUR,KLOR-CON) 20 MEQ tablet Take 1 tablet (20 mEq total) by mouth daily. 90 tablet 3  . temazepam (RESTORIL) 30 MG capsule Take 1 capsule (30 mg total) by mouth at bedtime. 30 capsule 5  . traMADol (ULTRAM) 50 MG tablet Take 1-2 tablets (50-100 mg total) by mouth every 6 (six) hours as needed. (Patient taking differently: Take 50-100 mg by mouth every 6  (six) hours as needed for moderate pain. ) 10 tablet 0  . vitamin C (ASCORBIC ACID) 500 MG tablet Take 500 mg by mouth daily.    [name] zinc sulfate (ZINC-220) 220 (50 Zn) MG capsule Take 220 mg by mouth daily.    [name] apixaban (ELIQUIS) 5 MG TABS tablet Take 1 tablet (5 mg total) by mouth 2 (two) times daily. 180 tablet 2   No current facility-administered medications for this visit.     Allergies  Allergen Reactions  . Zosyn [Piperacillin Sod-Tazobactam So] Hives, Rash and Other (See Comments)    Morbilliform eruption with itching  . Atenolol Nausea And Vomiting  . Clarithromycin Nausea And Vomiting  . Codeine Sulfate Nausea Only  . Levaquin [Levofloxacin] Nausea Only  . Macrodantin Other (See Comments)    UNSPECIFIED   . Oxycodone-Acetaminophen Nausea And Vomiting  . Percocet [Oxycodone-Acetaminophen] Nausea And Vomiting    Social History   Social History  . Marital status: Married    Spouse name: N/A  . Number of children: 2  . Years of education: N/A   Occupational History  . retired Unemployed   Social History Main Topics  . Smoking status: Never Smoker  . Smokeless tobacco: Never Used  . Alcohol use No     Comment: 1 glass occ wine 08/03/14- none in a year  . Drug use: No  . Sexual activity: Not on file   Other Topics Concern  . Not on file   Social History Narrative  . No narrative on file    Family History  Problem Relation Age of Onset  . Cancer Father   . Hypertension Other     Review of Systems:  As stated in the HPI and otherwise negative.   BP 126/70   Pulse 71   Ht 5\' 3"  (1.6 m)   Wt 178 lb 12.8 oz (81.1 kg)   SpO2 95%   BMI 31.67 kg/m   Physical Examination:  General: Well developed, well nourished, NAD  HEENT: OP clear, mucus membranes moist  SKIN: warm, dry. No rashes. Neuro: No focal deficits  Musculoskeletal:  Muscle strength 5/5 all ext  Psychiatric: Mood and affect normal  Neck: No JVD, no carotid bruits, no thyromegaly, no  lymphadenopathy.  Lungs:Clear bilaterally, no wheezes, rhonci, crackles Cardiovascular: Irreg irreg with no murmurs, gallops or rubs. Abdomen:Soft. Bowel sounds present. Non-tender.  Extremities: No lower extremity edema. Pulses are 2 + in the bilateral DP/PT.  EKG:  EKG is ordered today. The ekg ordered today demonstrates  Recent Labs: 04/21/2016: TSH 0.54 07/28/2016: ALT 24; HGB 13.9; Platelets 215 10/22/2016: BUN 14; Creatinine, Ser 1.13; Potassium 4.7; Sodium 135   Lipid Panel    Component Value Date/Time   CHOL 116 06/25/2012 1619   TRIG 82 09/05/2013 0300   HDL 41.50 06/25/2012 1619   CHOLHDL 3 06/25/2012 1619   VLDL 8.2 06/25/2012 1619   LDLCALC 66 06/25/2012 1619   LDLDIRECT 110.3 05/24/2008 1041     Wt Readings from Last 3 Encounters:  10/23/16 178 lb 12.8 oz (81.1 kg)  10/22/16 179 lb (81.2 kg)  08/22/16 179 lb (81.2 kg)     Other studies Reviewed: Additional studies/ records that were reviewed today include: . Review of the above records demonstrates:    Assessment and Plan:   1. Atrial fibrillation, persistent: She is atrial fibrillation today and is rate controlled. Will continue rate control strategy with anti-coagulation. Continue Cardizem, Toprol and Eliquis.  I will increase her Eliquis dose to 5 mg po BID (She is over 60 kg and has normal renal function).   2. HTN: BP controlled. No change in therapy  Current medicines are reviewed at length with the patient today.  The patient does not have concerns regarding medicines.  The following changes have been made:  no change  Labs/ tests ordered today include:  No orders of the defined types were placed in this encounter.   Disposition:   FU with me in 6 months  Signed, Lauree Chandler, MD 10/23/2016 3:22 PM    [facility], [place], [place]  [address] Phone: [phone]; Fax: [phone]

## 2016-10-23 NOTE — Patient Instructions (Signed)
Medication Instructions:  Your physician has recommended you make the following change in your medication:  Increase Eliquis to 5 mg by mouth twice daily.   Labwork: none  Testing/Procedures: none  Follow-Up: Your physician recommends that you schedule a follow-up appointment in: 6 months. Please call our office in about 3 months to schedule this appointment.     Any Other Special Instructions Will Be Listed Below (If Applicable).     If you need a refill on your cardiac medications before your next appointment, please call your pharmacy.

## 2016-10-27 DIAGNOSIS — C50412 Malignant neoplasm of upper-outer quadrant of left female breast: Secondary | ICD-10-CM | POA: Diagnosis not present

## 2016-10-28 ENCOUNTER — Telehealth: Payer: Self-pay | Admitting: Internal Medicine

## 2016-10-28 MED ORDER — METOPROLOL SUCCINATE ER 50 MG PO TB24: 50.0000 mg | ORAL_TABLET | Freq: Two times a day (BID) | ORAL | 3 refills | Status: DC

## 2016-10-28 NOTE — Telephone Encounter (Signed)
Pharmacy called for a refill of metoprolol succinate (TOPROL-XL) 50 MG 24 hr tablet  Please advise  [place]

## 2016-10-28 NOTE — Telephone Encounter (Signed)
Reviewed chart pt is up-to-date sent refills to [facility].../lmb  

## 2016-10-29 ENCOUNTER — Telehealth: Payer: Self-pay

## 2016-10-29 NOTE — Telephone Encounter (Signed)
pls give 8 bottles Thx

## 2016-10-29 NOTE — Telephone Encounter (Signed)
Patient would like to know if she could get samples of Linzess

## 2016-10-30 NOTE — Telephone Encounter (Signed)
Pt.notified

## 2016-11-21 ENCOUNTER — Telehealth: Payer: Self-pay | Admitting: Internal Medicine

## 2016-11-21 MED ORDER — TEMAZEPAM 30 MG PO CAPS: 30.0000 mg | ORAL_CAPSULE | Freq: Every day | ORAL | 5 refills | Status: DC

## 2016-11-21 NOTE — Telephone Encounter (Signed)
done

## 2016-11-21 NOTE — Telephone Encounter (Signed)
Pt called back checking on this. She said that she is sorry for calling so many times but she wants to make sure that she can get this medication. The person who fills the prescription at [facility] is out of town so they are needing it to be sent to [facility] in [place] who can fill it and bring it to her. She is out of the medication and is not able to sleep at night unless she has this medication. She said that she is having to go down to the dining room for dinner but would like a call on her home phone to let her know if this has been done. A message can be left on the voicemail.

## 2016-11-21 NOTE — Telephone Encounter (Signed)
Pt called checking on this. She said that she cannot sleep without it. She would like a call when it has been sent over.

## 2016-11-21 NOTE — Telephone Encounter (Signed)
Pt is needing a refill on temazepam (RESTORIL) 30 MG capsule. The pharmacy is also sending over a fax request.

## 2016-11-21 NOTE — Telephone Encounter (Signed)
Pharmacy called again about this med

## 2016-11-24 NOTE — Telephone Encounter (Signed)
Faxed friday

## 2016-11-27 ENCOUNTER — Telehealth: Payer: Self-pay | Admitting: Cardiovascular Disease

## 2016-11-27 ENCOUNTER — Telehealth: Payer: Self-pay | Admitting: Internal Medicine

## 2016-11-27 NOTE — Telephone Encounter (Signed)
Patient aware that I can place two weeks of eliquis samples at the front desk. I have informed her that we are unable to provide her with enough samples for the remainder of the year. I mentioned patient assistance but she does not feel that she would qualify. She stated that any amount of samples would be helpful as this would allow her more time before she has to purchase the refill at the pharmacy. I made her aware that she would need to contact the provider that prescribed the linzess to inquire about samples as we do not manage this medication nor do we have samples. Patient verbalized her understanding and appreciation for my call and the samples.

## 2016-11-27 NOTE — Telephone Encounter (Signed)
Follow Up Call: Mrs. [name] is calling to check to see if we have the Samples of Eliquis . Stating that she is has only 2 pills left and that she is in the donut hole . Please call

## 2016-11-27 NOTE — Telephone Encounter (Signed)
New message    Patient calling the office for samples of medication:   1.  What medication and dosage are you requesting samples for?apixaban (ELIQUIS) 5 MG TABS tablet and linaclotide (LINZESS) 290 MCG CAPS capsule  2.  Are you currently out of this medication? No

## 2016-11-27 NOTE — Telephone Encounter (Signed)
Pt called and would like to receive samples of Eliquis  She has 2 pills left and she cannot afford to refill it  She asked her cardiologists but they have not called her back

## 2016-11-28 NOTE — Telephone Encounter (Signed)
Patient received samples from Cardiologist

## 2016-12-15 ENCOUNTER — Telehealth: Payer: Self-pay | Admitting: Internal Medicine

## 2016-12-15 NOTE — Telephone Encounter (Signed)
Patient is currently in the hole on out of pocket.  Would like to know if we had samples on Eliquis and Linzess.

## 2016-12-15 NOTE — Telephone Encounter (Signed)
Do not have Eliquis OK Linzess  Thx

## 2016-12-16 NOTE — Telephone Encounter (Signed)
Patient would like to pick up these samples today. Can we please have these ready for her? Thank you.

## 2016-12-16 NOTE — Telephone Encounter (Signed)
Samples picked up

## 2017-01-05 ENCOUNTER — Telehealth: Payer: Self-pay | Admitting: Cardiovascular Disease

## 2017-01-05 NOTE — Telephone Encounter (Signed)
New message    Patient calling the office for samples of medication:   1.  What medication and dosage are you requesting samples for?apixaban (ELIQUIS) 5 MG TABS tablet  2.  Are you currently out of this medication? 4 days remaining

## 2017-01-05 NOTE — Telephone Encounter (Signed)
Spoke with patient and made her aware that I would place two weeks at the front desk. Patient verbalized her understanding and appreciation.

## 2017-01-06 ENCOUNTER — Telehealth: Payer: Self-pay | Admitting: Internal Medicine

## 2017-01-06 MED ORDER — OXYBUTYNIN CHLORIDE 5 MG PO TABS: 5.0000 mg | ORAL_TABLET | Freq: Two times a day (BID) | ORAL | 5 refills | Status: DC

## 2017-01-06 NOTE — Telephone Encounter (Signed)
Reviewed chart pt is up-to-date sent refills to [facility].../lmb  

## 2017-01-06 NOTE — Telephone Encounter (Signed)
Copied from [place] [phone]. Topic: Quick Communication - See Telephone Encounter >> Jan 06, 2017 11:33 AM Oneta Rack wrote: CRM for notification. See Telephone encounter for:   01/06/17.  [place], [place] [phone] (Phone) [phone] (Fax)   Reason for call:  Pharmacy requesting 30 day supply oxybutynin (DITROPAN) 5 MG tablet 3 refills

## 2017-01-22 ENCOUNTER — Telehealth: Payer: Self-pay | Admitting: Internal Medicine

## 2017-01-22 NOTE — Telephone Encounter (Signed)
Copied from [place] [phone]. Topic: Quick Communication - See Telephone Encounter >> Jan 22, 2017  4:25 PM Ether Griffins B wrote: CRM for notification. See Telephone encounter for:  [facility] pharmacy calling bc pt asked her to let office know a PA has been put in on linzess  01/22/17.

## 2017-02-02 ENCOUNTER — Telehealth: Payer: Self-pay | Admitting: Internal Medicine

## 2017-02-02 MED ORDER — LEVOTHYROXINE SODIUM 25 MCG PO TABS: ORAL_TABLET | ORAL | 1 refills | Status: DC

## 2017-02-02 MED ORDER — FUROSEMIDE 40 MG PO TABS: 40.0000 mg | ORAL_TABLET | ORAL | 1 refills | Status: DC

## 2017-02-02 NOTE — Telephone Encounter (Signed)
Last office visit was 10/22/16. Refills done.

## 2017-02-02 NOTE — Telephone Encounter (Signed)
Copied from [place] [phone]. Topic: General - Other >> Feb 02, 2017 11:07 AM Darl Householder, RMA wrote: Reason for CRM: Medication refill request for levothyroxine 25 mcg and Furosemide 40 mg to be sent to [facility]

## 2017-02-09 ENCOUNTER — Telehealth: Payer: Self-pay | Admitting: Internal Medicine

## 2017-02-09 MED ORDER — DOXYCYCLINE HYCLATE 100 MG PO TABS: 100.0000 mg | ORAL_TABLET | Freq: Two times a day (BID) | ORAL | 5 refills | Status: DC

## 2017-02-09 MED ORDER — BUSPIRONE HCL 7.5 MG PO TABS: 7.5000 mg | ORAL_TABLET | Freq: Two times a day (BID) | ORAL | 1 refills | Status: DC

## 2017-02-09 NOTE — Telephone Encounter (Signed)
RX sent

## 2017-02-09 NOTE — Addendum Note (Signed)
Addended by: Karren Cobble on: 02/09/2017 04:26 PM   Modules accepted: Orders

## 2017-02-09 NOTE — Telephone Encounter (Signed)
Dr. Judeen Hammans pt requesting refill on Doxycycline and Buspirone

## 2017-02-09 NOTE — Telephone Encounter (Signed)
Copied from [place] [phone]. Topic: General - Other >> Feb 09, 2017 11:19 AM Darl Householder, RMA wrote: Reason for CRM: Medication refill request for doxycycline 100 mg and buspirone 7.5 mg to be sent to [facility]

## 2017-02-17 ENCOUNTER — Encounter: Payer: Self-pay | Admitting: Internal Medicine

## 2017-02-17 ENCOUNTER — Ambulatory Visit (INDEPENDENT_AMBULATORY_CARE_PROVIDER_SITE_OTHER): Payer: Medicare Other | Admitting: Internal Medicine

## 2017-02-17 ENCOUNTER — Other Ambulatory Visit (INDEPENDENT_AMBULATORY_CARE_PROVIDER_SITE_OTHER): Payer: Medicare Other

## 2017-02-17 DIAGNOSIS — I10 Essential (primary) hypertension: Secondary | ICD-10-CM

## 2017-02-17 DIAGNOSIS — R609 Edema, unspecified: Secondary | ICD-10-CM | POA: Diagnosis not present

## 2017-02-17 DIAGNOSIS — E039 Hypothyroidism, unspecified: Secondary | ICD-10-CM | POA: Diagnosis not present

## 2017-02-17 DIAGNOSIS — I4892 Unspecified atrial flutter: Secondary | ICD-10-CM | POA: Diagnosis not present

## 2017-02-17 LAB — BASIC METABOLIC PANEL
BUN: 13 mg/dL (ref 6–23)
CO2: 30 mEq/L (ref 19–32)
Calcium: 9.3 mg/dL (ref 8.4–10.5)
Chloride: 98 mEq/L (ref 96–112)
Creatinine, Ser: 0.92 mg/dL (ref 0.40–1.20)
GFR: 61.05 mL/min (ref >60.00)
Glucose, Bld: 119 mg/dL — ABNORMAL HIGH (ref 70–99)
Potassium: 3.7 mEq/L (ref 3.5–5.1)
Sodium: 136 mEq/L (ref 135–145)

## 2017-02-17 NOTE — Assessment & Plan Note (Signed)
Levothroid 

## 2017-02-17 NOTE — Assessment & Plan Note (Signed)
Metoprolol, Diltiazem On Coumadin Recurrent

## 2017-02-17 NOTE — Assessment & Plan Note (Signed)
Metoprolol, Diltiazem

## 2017-02-17 NOTE — Assessment & Plan Note (Signed)
Resolved

## 2017-02-17 NOTE — Progress Notes (Signed)
Subjective:  Patient ID: [name], female    DOB: 1925-06-08  Age: 84 y.o. MRN: [id number]  CC: No chief complaint on file.   HPI [name] presents for A fib, dyslipidemia, hypothyroidism. Mobility issues are present.  Outpatient Medications Prior to Visit  Medication Sig Dispense Refill  . acetaminophen (TYLENOL) 500 MG tablet Take 500 mg by mouth daily as needed for mild pain.     [name] anastrozole (ARIMIDEX) 1 MG tablet Take 1 tablet (1 mg total) by mouth daily. 90 tablet 3  . apixaban (ELIQUIS) 5 MG TABS tablet Take 1 tablet (5 mg total) by mouth 2 (two) times daily. 180 tablet 2  . atorvastatin (LIPITOR) 10 MG tablet Take 1 tablet (10 mg total) by mouth every evening. 90 tablet 1  . B Complex-C (B-COMPLEX WITH VITAMIN C) tablet Take 1 tablet by mouth daily.    . busPIRone (BUSPAR) 7.5 MG tablet Take 1 tablet (7.5 mg total) by mouth 2 (two) times daily. 180 tablet 1  . Cholecalciferol (VITAMIN D3) 1000 UNITS tablet Take 1,000 Units by mouth daily.      [name] diltiazem (CARDIZEM CD) 120 MG 24 hr capsule TAKE 1 CAPSULE (120 MG TOTAL) BY MOUTH DAILY. 90 capsule 1  . doxycycline (VIBRA-TABS) 100 MG tablet Take 1 tablet (100 mg total) by mouth 2 (two) times daily. 60 tablet 5  . Ferrous Sulfate (IRON) 325 (65 FE) MG TABS Take 1 tablet by mouth daily.    . furosemide (LASIX) 40 MG tablet Take 1 tablet (40 mg total) by mouth as directed. 1 po on Mon-Wed-Fri in am 90 tablet 1  . levothyroxine (SYNTHROID, LEVOTHROID) 25 MCG tablet TAKE 1 TABLET BY MOUTH ONCE DAILY FOR THYROID 90 tablet 1  . linaclotide (LINZESS) 290 MCG CAPS capsule Take 1 capsule (290 mcg total) by mouth daily. 90 capsule 3  . metoprolol succinate (TOPROL-XL) 50 MG 24 hr tablet Take 1 tablet (50 mg total) by mouth 2 (two) times daily. Take with or immediately following a meal. 180 tablet 3  . Multiple Vitamins-Minerals (MULTIVITAMIN WITH MINERALS) tablet Take 1 tablet by mouth daily.    [name] omeprazole (PRILOSEC) 20 MG  capsule Take 20 mg by mouth daily as needed (acid reflux).     [name] oxybutynin (DITROPAN) 5 MG tablet Take 1 tablet (5 mg total) by mouth 2 (two) times daily. Yearly physical due in June must see MD for refills 60 tablet 5  . polyethylene glycol powder (GLYCOLAX/MIRALAX) powder Take 17-34 g by mouth 2 (two) times daily as needed for moderate constipation. 850 g 5  . potassium chloride SA (K-DUR,KLOR-CON) 20 MEQ tablet Take 1 tablet (20 mEq total) by mouth daily. 90 tablet 3  . temazepam (RESTORIL) 30 MG capsule Take 1 capsule (30 mg total) by mouth at bedtime. 30 capsule 5  . traMADol (ULTRAM) 50 MG tablet Take 1-2 tablets (50-100 mg total) by mouth every 6 (six) hours as needed. (Patient taking differently: Take 50-100 mg by mouth every 6 (six) hours as needed for moderate pain. ) 10 tablet 0  . vitamin C (ASCORBIC ACID) 500 MG tablet Take 500 mg by mouth daily.    [name] zinc sulfate (ZINC-220) 220 (50 Zn) MG capsule Take 220 mg by mouth daily.     No facility-administered medications prior to visit.     ROS Review of Systems  Constitutional: Negative for activity change, appetite change, chills, fatigue and unexpected weight change.  HENT: Negative for congestion,  mouth sores and sinus pressure.   Eyes: Negative for visual disturbance.  Respiratory: Negative for cough and chest tightness.   Gastrointestinal: Negative for abdominal pain and nausea.  Genitourinary: Negative for difficulty urinating, frequency and vaginal pain.  Musculoskeletal: Positive for back pain and gait problem.  Skin: Negative for pallor and rash.  Neurological: Negative for dizziness, tremors, weakness, numbness and headaches.  Psychiatric/Behavioral: Negative for confusion, sleep disturbance and suicidal ideas. The patient is not nervous/anxious.     Objective:  BP 132/82 (BP Location: Left Arm, Patient Position: Sitting, Cuff Size: Large)   Pulse 60   Temp 97.9 F (36.6 C) (Oral)   Ht 5\' 3"  (1.6 m)   Wt 178 lb  (80.7 kg)   SpO2 99%   BMI 31.53 kg/m   BP Readings from Last 3 Encounters:  02/17/17 132/82  10/23/16 126/70  10/22/16 124/72    Wt Readings from Last 3 Encounters:  02/17/17 178 lb (80.7 kg)  10/23/16 178 lb 12.8 oz (81.1 kg)  10/22/16 179 lb (81.2 kg)    Physical Exam  Constitutional: She appears well-developed. No distress.  HENT:  Head: Normocephalic.  Right Ear: External ear normal.  Left Ear: External ear normal.  Nose: Nose normal.  Mouth/Throat: Oropharynx is clear and moist.  Eyes: Conjunctivae are normal. Pupils are equal, round, and reactive to light. Right eye exhibits no discharge. Left eye exhibits no discharge.  Neck: Normal range of motion. Neck supple. No JVD present. No tracheal deviation present. No thyromegaly present.  Cardiovascular: Normal rate, regular rhythm and normal heart sounds.  Pulmonary/Chest: No stridor. No respiratory distress. She has no wheezes.  Abdominal: Soft. Bowel sounds are normal. She exhibits no distension and no mass. There is no tenderness. There is no rebound and no guarding.  Musculoskeletal: She exhibits tenderness. She exhibits no edema.  Lymphadenopathy:    She has no cervical adenopathy.  Neurological: She displays normal reflexes. No cranial nerve deficit. She exhibits normal muscle tone. Coordination abnormal.  Skin: No rash noted. No erythema.  Psychiatric: She has a normal mood and affect. Her behavior is normal. Judgment and thought content normal.  a walker  Lab Results  Component Value Date   WBC 5.7 07/28/2016   HGB 13.9 07/28/2016   HCT 41.9 07/28/2016   PLT 215 07/28/2016   GLUCOSE 123 (H) 10/22/2016   CHOL 116 06/25/2012   TRIG 82 09/05/2013   HDL 41.50 06/25/2012   LDLDIRECT 110.3 05/24/2008   LDLCALC 66 06/25/2012   ALT 24 07/28/2016   AST 21 07/28/2016   NA 135 10/22/2016   K 4.7 10/22/2016   CL 98 10/22/2016   CREATININE 1.13 10/22/2016   BUN 14 10/22/2016   CO2 28 10/22/2016   TSH 0.54  04/21/2016   INR 1.7 05/01/2016   HGBA1C 6.1 (H) 09/08/2013    Mr Lumbar Spine W Wo Contrast  Result Date: 07/16/2016 CLINICAL DATA:  Acute midline low back pain with sciatica Creatinine was obtained on site at [facility] at [address] Results: Creatinine 0.9 mg/dL. EXAM: MRI LUMBAR SPINE WITHOUT AND WITH CONTRAST TECHNIQUE: Multiplanar and multiecho pulse sequences of the lumbar spine were obtained without and with intravenous contrast. CONTRAST:  2mL MULTIHANCE GADOBENATE DIMEGLUMINE 529 MG/ML IV SOLN COMPARISON:  No prior MRI.  CT abdomen pelvis 06/13/2016 FINDINGS: Segmentation:  Normal Alignment: Slight anterolisthesis L2-3. 5 mm anterolisthesis L3-4. Mild anterolisthesis L4-5 Vertebrae: Mild compression fracture of T12 with bone marrow edema. Mild edema in the  superior endplate of L1. These lesions are most likely due to benign fractures. These areas show diffuse enhancement and were not present on the recent CT. Conus medullaris: Extends to the L1-2 level and appears normal. Paraspinal and other soft tissues: No mass or adenopathy Disc levels: T11-12:  Mild disc and facet degeneration T12-L1:  Mild disc degeneration without stenosis L1-2:  Mild disc bulging and facet degeneration without stenosis L2-3: Diffuse bulging of the disc. Bilateral facet hypertrophy. Mild foraminal narrowing bilaterally. L3-4: Solid interbody fusion. 5 mm anterolisthesis. Disc degeneration and spurring. Bilateral facet degeneration. Moderately severe spinal stenosis. Subarticular stenosis bilaterally. L4-5: Solid interbody fusion. Mild anterolisthesis. Mild subarticular and foraminal narrowing on the left due to spurring L5-S1: Disc degeneration with large left lateral osteophyte. No significant foraminal encroachment. IMPRESSION: Mild compression fracture of T12 which appears recent. Mild bone marrow edema enhancement L1 vertebral body consistent with contusion. No retropulsion of bone into the canal.  Moderate spinal stenosis L3-4. Solid interbody fusion L3-4 and L4-5. Electronically Signed   By: Franchot Gallo M.D.   On: 07/16/2016 15:12    Assessment & Plan:   There are no diagnoses linked to this encounter. I am having [name] maintain her cholecalciferol, B-complex with vitamin C, Iron, acetaminophen, vitamin C, omeprazole, multivitamin with minerals, traMADol, potassium chloride SA, atorvastatin, zinc sulfate, polyethylene glycol powder, linaclotide, anastrozole, diltiazem, apixaban, metoprolol succinate, temazepam, oxybutynin, levothyroxine, furosemide, busPIRone, and doxycycline.  No orders of the defined types were placed in this encounter.    Follow-up: No Follow-up on file.  Walker Kehr, MD

## 2017-03-12 ENCOUNTER — Ambulatory Visit (INDEPENDENT_AMBULATORY_CARE_PROVIDER_SITE_OTHER): Payer: Medicare Other | Admitting: Family Medicine

## 2017-03-12 ENCOUNTER — Encounter: Payer: Self-pay | Admitting: Family Medicine

## 2017-03-12 DIAGNOSIS — M545 Low back pain, unspecified: Secondary | ICD-10-CM

## 2017-03-12 NOTE — Progress Notes (Signed)
[name] - 84 y.o. female MRN [id number]  Date of birth: [birth year]  SUBJECTIVE:  Including CC & ROS.  Chief Complaint  Patient presents with  . Right hip pain    [name] is a 84 y.o. female that is presenting with right hip pain. Pain is located on the right lateral back and extends down to her greater trochanteric. It has been ongoing for six months increasing over the past three days. Denies injury or surgeries. Denies any specific event. She states she noticed the pain after she started taking Linzess.  She states the pain is constant. Pain increases when ambulating. She has been using a walker since 2015.   Review of bone scan from 2006 shows osteopenia.  Review of the MRI lumbar spine from 2018 shows mild compression fracture of T12 which is more recent. Mild bone marrow edema enhancement L1 vertebral body consistent with contusion. Moderate spinal stenosis at L3-4. Solid interbody fusion L3-4 and L4-5  Review of Systems  Constitutional: Negative for fever.  HENT: Negative for sinus pressure.   Respiratory: Negative for shortness of breath.   Cardiovascular: Negative for chest pain.  Gastrointestinal: Negative for abdominal pain.  Musculoskeletal: Positive for arthralgias, back pain and gait problem.  Skin: Negative for color change.  Neurological: Negative for numbness.  Hematological: Negative for adenopathy.  Psychiatric/Behavioral: Negative for agitation.    HISTORY: Past Medical, Surgical, Social, and Family History Reviewed & Updated per EMR.   Pertinent Historical Findings include:  Past Medical History:  Diagnosis Date  . Anticoagulated on Coumadin   . Anxiety    denies  . Bilateral edema of lower extremity   . Cancer ([place])    left breast  . Chronic constipation   . Diverticulosis of colon   . GERD (gastroesophageal reflux disease)   . H/O hiatal hernia   . History of cellulitis    LEFT LOWER LEG --  SEPT 2015  . History of colitis    DX  2003   WITH ISCHEMIC COLITIS  . History of diverticulitis of colon    perferated diverticulitis w/ abscess 08-25-2013 drain placed --  resolved without surgical intervention  . History of GI bleed    2011--- UPPER SECONARY GASTRIC ULCER FROM NSAID USE  . History of Helicobacter pylori infection    2011  . History of ulcer of lower limb    2012   RIGHT LOWER EXTREMITIY--  STATSIS ULCER  . Hyperlipidemia   . Hypertension   . Hypothyroidism   . Iron deficiency anemia 2011   elevated MCV  . LBP (low back pain)    severe lumbar spondylosis s/p L3/L4,L4/L5 fusion 12/10  . Lumbar spondylolysis   . OA (osteoarthritis)    "hands" (08/25/2013)  . Open wound of left lower leg   . Osteopenia   . Persistent atrial fibrillation ([place])    CARDIOLOGIST-   DR MCALANY  . RBBB    at fib  . Urinary frequency     Past Surgical History:  Procedure Laterality Date  . APPLICATION OF A-CELL OF EXTREMITY Left 06/21/2013   Procedure: APPLICATION OF A-CELL OF EXTREMITY;  Surgeon: Theodoro Kos, DO;  Location: [facility];  Service: Plastics;  Laterality: Left;  . APPLICATION OF A-CELL OF EXTREMITY Left 08/22/2013   Procedure: APPLICATION OF A-CELL/VAC TO LOWER LEFT LEG WOUND;  Surgeon: Theodoro Kos, DO;  Location: [facility];  Service: Plastics;  Laterality: Left;  . APPLICATION OF A-CELL OF EXTREMITY Left 02/06/2014  Procedure: WITH PLACEMENT OF A -CELL ;  Surgeon: Theodoro Kos, DO;  Location: [facility];  Service: Plastics;  Laterality: Left;  . APPLICATION OF A-CELL OF EXTREMITY Left 08/09/2014   Procedure: PLACEMENT OF A CELL;  Surgeon: Theodoro Kos, DO;  Location: [facility];  Service: Plastics;  Laterality: Left;  . APPLICATION OF WOUND VAC Left 06/16/2013   Procedure: APPLICATION OF WOUND VAC;  Surgeon: Meredith Pel, MD;  Location: [facility];  Service: Orthopedics;  Laterality: Left;  . APPLICATION OF WOUND VAC Left 06/21/2013   Procedure: APPLICATION OF WOUND VAC;  Surgeon: Theodoro Kos, DO;  Location: [facility];  Service: Plastics;  Laterality: Left;  . BIOPSY BREAST  07/25/2015   LEFT RADIOACTIVE SEED GUIDED EXCISIONAL BREAST BIOPSY (Left) as a surgical intervention  . EXTERNAL FIXATION LEG Left 06/16/2013   Procedure: EXTERNAL FIXATION LEG;  Surgeon: Meredith Pel, MD;  Location: [facility];  Service: Orthopedics;  Laterality: Left;  . EXTERNAL FIXATION REMOVAL Left 06/21/2013   Procedure: REMOVAL EXTERNAL FIXATION LEG;  Surgeon: Meredith Pel, MD;  Location: [facility];  Service: Orthopedics;  Laterality: Left;  . EYE SURGERY Bilateral    cataract removal  . HARDWARE REMOVAL Left 05/23/2014   Procedure: HARDWARE REMOVAL;  Surgeon: Meredith Pel, MD;  Location: [facility];  Service: Orthopedics;  Laterality: Left;  REMOVAL OF HARDWARE LEFT TIBIA  . I&D EXTREMITY Left 06/16/2013   Procedure: IRRIGATION AND DEBRIDEMENT EXTREMITY;  Surgeon: Meredith Pel, MD;  Location: [facility];  Service: Orthopedics;  Laterality: Left;  . I&D EXTREMITY Left 02/06/2014   Procedure: IRRIGATION AND DEBRIDEMENT LEFT LEG WOUND ;  Surgeon: Theodoro Kos, DO;  Location: [facility];  Service: Plastics;  Laterality: Left;  . I&D EXTREMITY Left 08/09/2014   Procedure: IRRIGATION AND DEBRIDEMENT  OF LEFT LOWER LEG;  Surgeon: Theodoro Kos, DO;  Location: [facility];  Service: Plastics;  Laterality: Left;  . LUMBAR FUSION  01-02-2009   L3-L5  . RADIOACTIVE SEED GUIDED EXCISIONAL BREAST BIOPSY Left 07/25/2015   Procedure: LEFT RADIOACTIVE SEED GUIDED EXCISIONAL BREAST BIOPSY;  Surgeon: Rolm Bookbinder, MD;  Location: [facility];  Service: General;  Laterality: Left;  . RE-EXCISION OF BREAST LUMPECTOMY Left 09/05/2015   Procedure: RE-EXCISION OF LEFT BREAST LUMPECTOMY;  Surgeon: Rolm Bookbinder, MD;  Location: [facility];  Service: General;  Laterality: Left;  . SHOULDER OPEN ROTATOR CUFF REPAIR Left 1997  . TIBIA IM NAIL INSERTION Left 06/21/2013   Procedure: INTRAMEDULLARY (IM) NAIL TIBIAL;  Surgeon:  Meredith Pel, MD;  Location: [facility];  Service: Orthopedics;  Laterality: Left;  . TONSILLECTOMY  AS CHILD  . TOTAL HIP ARTHROPLASTY Right 06-07-2010  . TRANSTHORACIC ECHOCARDIOGRAM  09-11-2010   DR MCALHANY   LVSF  55-60%/  MILD MV CALCIFICATION WITH NO STENOSIS/  MILD MR & TR / MODERATE LAE/  MODERATE TO SEVERE RAE  . UPPER GI ENDOSCOPY  03-29-2010    Allergies  Allergen Reactions  . Zosyn [Piperacillin Sod-Tazobactam So] Hives, Rash and Other (See Comments)    Morbilliform eruption with itching  . Atenolol Nausea And Vomiting  . Clarithromycin Nausea And Vomiting  . Codeine Sulfate Nausea Only  . Levaquin [Levofloxacin] Nausea Only  . Macrodantin Other (See Comments)    UNSPECIFIED   . Oxycodone-Acetaminophen Nausea And Vomiting  . Percocet [Oxycodone-Acetaminophen] Nausea And Vomiting    Family History  Problem Relation Age of Onset  . Cancer Father   . Hypertension Other  Social History   Socioeconomic History  . Marital status: Married    Spouse name: Not on file  . Number of children: 2  . Years of education: Not on file  . Highest education level: Not on file  Social Needs  . Financial resource strain: Not on file  . Food insecurity - worry: Not on file  . Food insecurity - inability: Not on file  . Transportation needs - medical: Not on file  . Transportation needs - non-medical: Not on file  Occupational History  . Occupation: retired    Fish farm manager: UNEMPLOYED  Tobacco Use  . Smoking status: Never Smoker  . Smokeless tobacco: Never Used  Substance and Sexual Activity  . Alcohol use: No    Comment: 1 glass occ wine 08/03/14- none in a year  . Drug use: No  . Sexual activity: Not on file  Other Topics Concern  . Not on file  Social History Narrative  . Not on file     PHYSICAL EXAM:  VS: BP 138/74 (BP Location: Left Arm, Patient Position: Sitting, Cuff Size: Normal)   Pulse 76   Temp 97.6 F (36.4 C) (Oral)   Ht 5\' 3"  (1.6 m)   Wt 176 lb  (79.8 kg)   SpO2 97%   BMI 31.18 kg/m  Physical Exam Gen: NAD, alert, cooperative with exam, well-appearing ENT: normal lips, normal nasal mucosa,  Eye: normal EOM, normal conjunctiva and lids CV:  no edema, +2 pedal pulses   Resp: no accessory muscle use, non-labored,  Skin: no rashes, no areas of induration  Neuro: normal tone, normal sensation to touch Psych:  normal insight, alert and oriented MSK:  Back: Tenderness to palpation near the pelvic rim on the right side. No tenderness to palpation over the midline spine. Normal strength to resistance with hip flexion. Normal external and internal rotation of the hips. Normal knee flexion and extension resistance to strength. Negative straight leg raise bilaterally. Usually related to help walk. Tends to walk with a forward lean. Neurovascular intact     ASSESSMENT & PLAN:   Low back pain Pain on the right side is likely consistent with a muscular origin with her ongoing low back pain. Her gait is impaired and having to use her Rollator. She tends to walk with a forward lean. Likely has some weakness with her gluteus medius as well as her core. - Counseled on supportive care - Counseled on home exercise therapy - Could consider referral to his therapy if no improvement.

## 2017-03-12 NOTE — Patient Instructions (Signed)
Please try the exercises provided. Please try Aspercreme with lidocaine to rub onto the area. Please try alternating heat and ice. Please follow-up with me in 4-6 weeks ago at her pain is not improved.

## 2017-03-12 NOTE — Assessment & Plan Note (Signed)
Pain on the right side is likely consistent with a muscular origin with her ongoing low back pain. Her gait is impaired and having to use her Rollator. She tends to walk with a forward lean. Likely has some weakness with her gluteus medius as well as her core. - Counseled on supportive care - Counseled on home exercise therapy - Could consider referral to his therapy if no improvement.

## 2017-04-03 ENCOUNTER — Other Ambulatory Visit: Payer: Self-pay | Admitting: *Deleted

## 2017-04-03 MED ORDER — ANASTROZOLE 1 MG PO TABS: 1.0000 mg | ORAL_TABLET | Freq: Every day | ORAL | 3 refills | Status: DC

## 2017-04-06 ENCOUNTER — Other Ambulatory Visit: Payer: Self-pay | Admitting: *Deleted

## 2017-04-06 MED ORDER — ANASTROZOLE 1 MG PO TABS: 1.0000 mg | ORAL_TABLET | Freq: Every day | ORAL | 3 refills | Status: DC

## 2017-04-14 ENCOUNTER — Telehealth: Payer: Self-pay | Admitting: Internal Medicine

## 2017-04-14 MED ORDER — DILTIAZEM HCL ER COATED BEADS 120 MG PO CP24: ORAL_CAPSULE | ORAL | 1 refills | Status: DC

## 2017-04-14 NOTE — Telephone Encounter (Signed)
Copied from [place] [phone]. Topic: Quick Communication - Rx Refill/Question >> Apr 14, 2017 10:24 AM Lysle Morales L, NT wrote: Medication: diltiazem (CARDIZEM CD) 120 MG 24 hr capsule Has the patient contacted their pharmacy? {yes  (Agent: If no, request that the patient contact the pharmacy for the refill. Preferred Pharmacy (with phone number or street name):[facility], [name] - [place] [phone] (Phone) [phone] (Fax Agent: Please be advised that RX refills may take up to 3 business days. We ask that you follow-up with your pharmacy.

## 2017-04-30 ENCOUNTER — Telehealth: Payer: Self-pay | Admitting: Internal Medicine

## 2017-04-30 NOTE — Progress Notes (Signed)
Chief Complaint  Patient presents with  . Follow-up    Atrial fibrillation    History of Present Illness: 84 yo female with history of HTN, chronic low back pain, iron deficiency anemia and persistent atrial fibrillation/flutter who is here today for follow up. She is on Eliquis. In 2011, while on coumadin, she had dark stools with anemia. Coumadin was held and upper endoscopy showed several small clean based gastric ulcers. She has had no bleeding issues on Eliquis. She has chronic lower ext edema controlled with Lasix. She has left breast cancer and has had lumpectomy in July 2017.   She is here today for follow up. The patient denies any chest pain, dyspnea, palpitations, lower extremity edema, orthopnea, PND, dizziness, near syncope or syncope.    Primary Care Physician: [name], MD   Past Medical History:  Diagnosis Date  . Anticoagulated on Coumadin   . Anxiety    denies  . Bilateral edema of lower extremity   . Cancer ([place])    left breast  . Chronic constipation   . Diverticulosis of colon   . GERD (gastroesophageal reflux disease)   . H/O hiatal hernia   . History of cellulitis    LEFT LOWER LEG --  SEPT 2015  . History of colitis    DX  2003  WITH ISCHEMIC COLITIS  . History of diverticulitis of colon    perferated diverticulitis w/ abscess 08-25-2013 drain placed --  resolved without surgical intervention  . History of GI bleed    2011--- UPPER SECONARY GASTRIC ULCER FROM NSAID USE  . History of Helicobacter pylori infection    2011  . History of ulcer of lower limb    2012   RIGHT LOWER EXTREMITIY--  STATSIS ULCER  . Hyperlipidemia   . Hypertension   . Hypothyroidism   . Iron deficiency anemia 2011   elevated MCV  . LBP (low back pain)    severe lumbar spondylosis s/p L3/L4,L4/L5 fusion 12/10  . Lumbar spondylolysis   . OA (osteoarthritis)    "hands" (08/25/2013)  . Open wound of left lower leg   . Osteopenia   . Persistent atrial  fibrillation ([place])    CARDIOLOGIST-   DR MCALANY  . RBBB    at fib  . Urinary frequency     Past Surgical History:  Procedure Laterality Date  . APPLICATION OF A-CELL OF EXTREMITY Left 06/21/2013   Procedure: APPLICATION OF A-CELL OF EXTREMITY;  Surgeon: Theodoro Kos, DO;  Location: [facility];  Service: Plastics;  Laterality: Left;  . APPLICATION OF A-CELL OF EXTREMITY Left 08/22/2013   Procedure: APPLICATION OF A-CELL/VAC TO LOWER LEFT LEG WOUND;  Surgeon: Theodoro Kos, DO;  Location: [place];  Service: Plastics;  Laterality: Left;  . APPLICATION OF A-CELL OF EXTREMITY Left 02/06/2014   Procedure: WITH PLACEMENT OF A -CELL ;  Surgeon: Theodoro Kos, DO;  Location: [place];  Service: Plastics;  Laterality: Left;  . APPLICATION OF A-CELL OF EXTREMITY Left 08/09/2014   Procedure: PLACEMENT OF A CELL;  Surgeon: Theodoro Kos, DO;  Location: [facility];  Service: Plastics;  Laterality: Left;  . APPLICATION OF WOUND VAC Left 06/16/2013   Procedure: APPLICATION OF WOUND VAC;  Surgeon: Meredith Pel, MD;  Location: [facility];  Service: Orthopedics;  Laterality: Left;  . APPLICATION OF WOUND VAC Left 06/21/2013   Procedure: APPLICATION OF WOUND VAC;  Surgeon: Theodoro Kos, DO;  Location: [facility];  Service:  Plastics;  Laterality: Left;  . BIOPSY BREAST  07/25/2015   LEFT RADIOACTIVE SEED GUIDED EXCISIONAL BREAST BIOPSY (Left) as a surgical intervention  . EXTERNAL FIXATION LEG Left 06/16/2013   Procedure: EXTERNAL FIXATION LEG;  Surgeon: Meredith Pel, MD;  Location: [place];  Service: Orthopedics;  Laterality: Left;  . EXTERNAL FIXATION REMOVAL Left 06/21/2013   Procedure: REMOVAL EXTERNAL FIXATION LEG;  Surgeon: Meredith Pel, MD;  Location: [facility];  Service: Orthopedics;  Laterality: Left;  . EYE SURGERY Bilateral    cataract removal  . HARDWARE REMOVAL Left 05/23/2014   Procedure: HARDWARE REMOVAL;  Surgeon: Meredith Pel, MD;  Location: [place];  Service:  Orthopedics;  Laterality: Left;  REMOVAL OF HARDWARE LEFT TIBIA  . I&D EXTREMITY Left 06/16/2013   Procedure: IRRIGATION AND DEBRIDEMENT EXTREMITY;  Surgeon: Meredith Pel, MD;  Location: [facility];  Service: Orthopedics;  Laterality: Left;  . I&D EXTREMITY Left 02/06/2014   Procedure: IRRIGATION AND DEBRIDEMENT LEFT LEG WOUND ;  Surgeon: Theodoro Kos, DO;  Location: [facility];  Service: Plastics;  Laterality: Left;  . I&D EXTREMITY Left 08/09/2014   Procedure: IRRIGATION AND DEBRIDEMENT  OF LEFT LOWER LEG;  Surgeon: Theodoro Kos, DO;  Location: [place];  Service: Plastics;  Laterality: Left;  . LUMBAR FUSION  01-02-2009   L3-L5  . RADIOACTIVE SEED GUIDED EXCISIONAL BREAST BIOPSY Left 07/25/2015   Procedure: LEFT RADIOACTIVE SEED GUIDED EXCISIONAL BREAST BIOPSY;  Surgeon: Rolm Bookbinder, MD;  Location: [facility] [place];  Service: General;  Laterality: Left;  . RE-EXCISION OF BREAST LUMPECTOMY Left 09/05/2015   Procedure: RE-EXCISION OF LEFT BREAST LUMPECTOMY;  Surgeon: Rolm Bookbinder, MD;  Location: [place] [facility];  Service: General;  Laterality: Left;  . SHOULDER OPEN ROTATOR CUFF REPAIR Left 1997  . TIBIA IM NAIL INSERTION Left 06/21/2013   Procedure: INTRAMEDULLARY (IM) NAIL TIBIAL;  Surgeon: Meredith Pel, MD;  Location: [facility];  Service: Orthopedics;  Laterality: Left;  . TONSILLECTOMY  AS CHILD  . TOTAL HIP ARTHROPLASTY Right 06-07-2010  . TRANSTHORACIC ECHOCARDIOGRAM  09-11-2010   DR MCALHANY   LVSF  55-60%/  MILD MV CALCIFICATION WITH NO STENOSIS/  MILD MR & TR / MODERATE LAE/  MODERATE TO SEVERE RAE  . UPPER GI ENDOSCOPY  03-29-2010    Current Outpatient Medications  Medication Sig Dispense Refill  . acetaminophen (TYLENOL) 500 MG tablet Take 500 mg by mouth daily as needed for mild pain.     [name] anastrozole (ARIMIDEX) 1 MG tablet Take 1 tablet (1 mg total) by mouth daily. 90 tablet 3  . apixaban (ELIQUIS) 5 MG TABS tablet Take 1 tablet (5 mg total) by mouth 2 (two) times daily. 180  tablet 2  . atorvastatin (LIPITOR) 10 MG tablet Take 1 tablet (10 mg total) by mouth every evening. 90 tablet 1  . B Complex-C (B-COMPLEX WITH VITAMIN C) tablet Take 1 tablet by mouth daily.    . busPIRone (BUSPAR) 7.5 MG tablet Take 1 tablet (7.5 mg total) by mouth 2 (two) times daily. 180 tablet 1  . Cholecalciferol (VITAMIN D3) 1000 UNITS tablet Take 1,000 Units by mouth daily.      [name] diltiazem (CARDIZEM CD) 120 MG 24 hr capsule TAKE 1 CAPSULE (120 MG TOTAL) BY MOUTH DAILY. 90 capsule 1  . doxycycline (VIBRA-TABS) 100 MG tablet Take 1 tablet (100 mg total) by mouth 2 (two) times daily. 60 tablet 5  . Ferrous Sulfate (IRON) 325 (65 FE) MG TABS Take 1 tablet  by mouth daily.    . furosemide (LASIX) 40 MG tablet Take 1 tablet (40 mg total) by mouth as directed. 1 po on Mon-Wed-Fri in am 90 tablet 1  . levothyroxine (SYNTHROID, LEVOTHROID) 25 MCG tablet TAKE 1 TABLET BY MOUTH ONCE DAILY FOR THYROID 90 tablet 1  . linaclotide (LINZESS) 290 MCG CAPS capsule Take 1 capsule (290 mcg total) by mouth daily. 90 capsule 3  . metoprolol succinate (TOPROL-XL) 50 MG 24 hr tablet Take 1 tablet (50 mg total) by mouth 2 (two) times daily. Take with or immediately following a meal. 180 tablet 3  . Multiple Vitamins-Minerals (MULTIVITAMIN WITH MINERALS) tablet Take 1 tablet by mouth daily.    [name] omeprazole (PRILOSEC) 20 MG capsule Take 20 mg by mouth daily as needed (acid reflux).     [name] oxybutynin (DITROPAN) 5 MG tablet Take 1 tablet (5 mg total) by mouth 2 (two) times daily. Yearly physical due in June must see MD for refills 60 tablet 5  . polyethylene glycol powder (GLYCOLAX/MIRALAX) powder Take 17-34 g by mouth 2 (two) times daily as needed for moderate constipation. 850 g 5  . potassium chloride SA (K-DUR,KLOR-CON) 20 MEQ tablet Take 1 tablet (20 mEq total) by mouth daily. 90 tablet 3  . temazepam (RESTORIL) 30 MG capsule Take 1 capsule (30 mg total) by mouth at bedtime. 30 capsule 5  . traMADol (ULTRAM) 50 MG  tablet Take 1-2 tablets (50-100 mg total) by mouth every 6 (six) hours as needed. (Patient taking differently: Take 50-100 mg by mouth every 6 (six) hours as needed for moderate pain. ) 10 tablet 0  . vitamin C (ASCORBIC ACID) 500 MG tablet Take 500 mg by mouth daily.    [name] zinc sulfate (ZINC-220) 220 (50 Zn) MG capsule Take 220 mg by mouth daily.     No current facility-administered medications for this visit.     Allergies  Allergen Reactions  . Zosyn [Piperacillin Sod-Tazobactam So] Hives, Rash and Other (See Comments)    Morbilliform eruption with itching  . Atenolol Nausea And Vomiting  . Clarithromycin Nausea And Vomiting  . Codeine Sulfate Nausea Only  . Levaquin [Levofloxacin] Nausea Only  . Macrodantin Other (See Comments)    UNSPECIFIED   . Oxycodone-Acetaminophen Nausea And Vomiting  . Percocet [Oxycodone-Acetaminophen] Nausea And Vomiting    Social History   Socioeconomic History  . Marital status: Married    Spouse name: Not on file  . Number of children: 2  . Years of education: Not on file  . Highest education level: Not on file  Occupational History  . Occupation: retired    Fish farm manager: UNEMPLOYED  Social Needs  . Financial resource strain: Not on file  . Food insecurity:    Worry: Not on file    Inability: Not on file  . Transportation needs:    Medical: Not on file    Non-medical: Not on file  Tobacco Use  . Smoking status: Never Smoker  . Smokeless tobacco: Never Used  Substance and Sexual Activity  . Alcohol use: No    Comment: 1 glass occ wine 08/03/14- none in a year  . Drug use: No  . Sexual activity: Not on file  Lifestyle  . Physical activity:    Days per week: Not on file    Minutes per session: Not on file  . Stress: Not on file  Relationships  . Social connections:    Talks on phone: Not on file    Gets  together: Not on file    Attends religious service: Not on file    Active member of club or organization: Not on file    Attends  meetings of clubs or organizations: Not on file    Relationship status: Not on file  . Intimate partner violence:    Fear of current or ex partner: Not on file    Emotionally abused: Not on file    Physically abused: Not on file    Forced sexual activity: Not on file  Other Topics Concern  . Not on file  Social History Narrative  . Not on file    Family History  Problem Relation Age of Onset  . Cancer Father   . Hypertension Other     Review of Systems:  As stated in the HPI and otherwise negative.   BP (!) 144/70   Pulse 69   Ht 5\' 3"  (1.6 m)   Wt 180 lb (81.6 kg)   SpO2 97%   BMI 31.89 kg/m   Physical Examination:  General: Well developed, well nourished, NAD  HEENT: OP clear, mucus membranes moist  SKIN: warm, dry. No rashes. Neuro: No focal deficits  Musculoskeletal: Muscle strength 5/5 all ext  Psychiatric: Mood and affect normal  Neck: No JVD, no carotid bruits, no thyromegaly, no lymphadenopathy.  Lungs:Clear bilaterally, no wheezes, rhonci, crackles Cardiovascular: Irreg irreg. Soft systolic murmur.  Abdomen:Soft. Bowel sounds present. Non-tender.  Extremities: No lower extremity edema. Pulses are 2 + in the bilateral DP/PT.  EKG:  EKG is ordered today. The ekg ordered today demonstrates atrial fibrillation, RBBB  Recent Labs: 07/28/2016: ALT 24; HGB 13.9; Platelets 215 02/17/2017: BUN 13; Creatinine, Ser 0.92; Potassium 3.7; Sodium 136   Lipid Panel Followed in primary care.    Wt Readings from Last 3 Encounters:  05/01/17 180 lb (81.6 kg)  03/12/17 176 lb (79.8 kg)  02/17/17 178 lb (80.7 kg)     Other studies Reviewed: Additional studies/ records that were reviewed today include: . Review of the above records demonstrates:    Assessment and Plan:   1. Atrial fibrillation, persistent: She remains in atrial fib. She is rate controlled. I will continue Toprol, Cardizem and Eliquis.    2. HTN: BP is well controlled. No changes.   3. HLD: She is  on a statin. This is followed in primary care. She will need labs in primary care at her visit in May.   4. Chronic diastolic CHF: Volume status is stable. Will continue Lasix three days per week. Will arrange echo now.   Current medicines are reviewed at length with the patient today.  The patient does not have concerns regarding medicines.  The following changes have been made:  no change  Labs/ tests ordered today include:   Orders Placed This Encounter  Procedures  . EKG 12-Lead  . ECHOCARDIOGRAM COMPLETE    Disposition:   FU with me in 6 months  Signed, Lauree Chandler, MD 05/01/2017 2:23 PM    [facility] [address], [place], [place]  [address] Phone: [phone]; Fax: [phone]

## 2017-04-30 NOTE — Telephone Encounter (Signed)
Called patient to inform about Prolia Injection. No answer and no voicemail) She has a $0 copay and is due now for this injection. Please schedule on the nurse schedule with visit note - Prolia ($0 copay - okay to give per [facility]).

## 2017-05-01 ENCOUNTER — Ambulatory Visit (INDEPENDENT_AMBULATORY_CARE_PROVIDER_SITE_OTHER): Payer: Medicare Other | Admitting: Cardiovascular Disease

## 2017-05-01 ENCOUNTER — Encounter: Payer: Self-pay | Admitting: Cardiovascular Disease

## 2017-05-01 VITALS — BP 144/70 | HR 69 | Ht 63.0 in | Wt 180.0 lb

## 2017-05-01 DIAGNOSIS — I481 Persistent atrial fibrillation: Secondary | ICD-10-CM | POA: Diagnosis not present

## 2017-05-01 DIAGNOSIS — I5032 Chronic diastolic (congestive) heart failure: Secondary | ICD-10-CM | POA: Diagnosis not present

## 2017-05-01 DIAGNOSIS — I4819 Other persistent atrial fibrillation: Secondary | ICD-10-CM

## 2017-05-01 DIAGNOSIS — I10 Essential (primary) hypertension: Secondary | ICD-10-CM | POA: Diagnosis not present

## 2017-05-01 NOTE — Patient Instructions (Signed)
Medication Instructions:  Your physician recommends that you continue on your current medications as directed. Please refer to the Current Medication list given to you today.   Labwork: none  Testing/Procedures: Your physician has requested that you have an echocardiogram. Echocardiography is a painless test that uses sound waves to create images of your heart. It provides your doctor with information about the size and shape of your heart and how well your heart's chambers and valves are working. This procedure takes approximately one hour. There are no restrictions for this procedure.    Follow-Up: Your physician recommends that you schedule a follow-up appointment in: 6 months. Please call our office in about 3 months to schedule this appointment.     Any Other Special Instructions Will Be Listed Below (If Applicable).     If you need a refill on your cardiac medications before your next appointment, please call your pharmacy.   

## 2017-05-04 ENCOUNTER — Telehealth: Payer: Self-pay | Admitting: Internal Medicine

## 2017-05-04 MED ORDER — POTASSIUM CHLORIDE CRYS ER 20 MEQ PO TBCR: 20.0000 meq | EXTENDED_RELEASE_TABLET | Freq: Every day | ORAL | 1 refills | Status: DC

## 2017-05-04 NOTE — Telephone Encounter (Signed)
Copied from [place] [id number]. Topic: Quick Communication - Rx Refill/Question >> May 04, 2017  9:16 AM Ether Griffins B wrote: Medication: potassium chloride SA (K-DUR,KLOR-CON) 20 MEQ tablet   Has the patient contacted their pharmacy? Yes.   (Agent: If no, request that the patient contact the pharmacy for the refill.)  Preferred Pharmacy (with phone number or street name): [facility], [place]: Please be advised that RX refills may take up to 3 business days. We ask that you follow-up with your pharmacy.

## 2017-05-07 ENCOUNTER — Ambulatory Visit (HOSPITAL_COMMUNITY): Payer: Medicare Other | Attending: Cardiology

## 2017-05-07 ENCOUNTER — Other Ambulatory Visit: Payer: Self-pay

## 2017-05-07 DIAGNOSIS — I4510 Unspecified right bundle-branch block: Secondary | ICD-10-CM | POA: Diagnosis not present

## 2017-05-07 DIAGNOSIS — I5032 Chronic diastolic (congestive) heart failure: Secondary | ICD-10-CM | POA: Diagnosis not present

## 2017-05-07 DIAGNOSIS — Z853 Personal history of malignant neoplasm of breast: Secondary | ICD-10-CM | POA: Diagnosis not present

## 2017-05-07 DIAGNOSIS — E785 Hyperlipidemia, unspecified: Secondary | ICD-10-CM | POA: Diagnosis not present

## 2017-05-07 DIAGNOSIS — Z8249 Family history of ischemic heart disease and other diseases of the circulatory system: Secondary | ICD-10-CM | POA: Insufficient documentation

## 2017-05-07 DIAGNOSIS — I4891 Unspecified atrial fibrillation: Secondary | ICD-10-CM | POA: Diagnosis not present

## 2017-05-07 DIAGNOSIS — I110 Hypertensive heart disease with heart failure: Secondary | ICD-10-CM | POA: Diagnosis not present

## 2017-05-18 ENCOUNTER — Other Ambulatory Visit: Payer: Self-pay | Admitting: Internal Medicine

## 2017-05-18 NOTE — Telephone Encounter (Signed)
Restoril refill request  LOV 02/17/17 with Dr. Alain Marion  Last refill:  11/21/16   #30  [place], [place]. [place]

## 2017-05-18 NOTE — Telephone Encounter (Signed)
Copied from [facility] [id number]. Topic: Quick Communication - Rx Refill/Question >> May 18, 2017 10:28 AM Ether Griffins B wrote: Medication: temazepam (RESTORIL) 30 MG capsule Has the patient contacted their pharmacy? Yes.   (Agent: If no, request that the patient contact the pharmacy for the refill.) Preferred Pharmacy (with phone number or street name): [facility], [place] - [place]: Please be advised that RX refills may take up to 3 business days. We ask that you follow-up with your pharmacy.

## 2017-05-20 ENCOUNTER — Encounter: Payer: Self-pay | Admitting: Internal Medicine

## 2017-05-20 ENCOUNTER — Other Ambulatory Visit (INDEPENDENT_AMBULATORY_CARE_PROVIDER_SITE_OTHER): Payer: Medicare Other

## 2017-05-20 ENCOUNTER — Ambulatory Visit (INDEPENDENT_AMBULATORY_CARE_PROVIDER_SITE_OTHER): Payer: Medicare Other | Admitting: Internal Medicine

## 2017-05-20 DIAGNOSIS — K5901 Slow transit constipation: Secondary | ICD-10-CM

## 2017-05-20 DIAGNOSIS — D508 Other iron deficiency anemias: Secondary | ICD-10-CM

## 2017-05-20 DIAGNOSIS — Z7901 Long term (current) use of anticoagulants: Secondary | ICD-10-CM | POA: Diagnosis not present

## 2017-05-20 DIAGNOSIS — I4892 Unspecified atrial flutter: Secondary | ICD-10-CM

## 2017-05-20 LAB — BASIC METABOLIC PANEL
BUN: 14 mg/dL (ref 6–23)
CO2: 31 mEq/L (ref 19–32)
Calcium: 9.6 mg/dL (ref 8.4–10.5)
Chloride: 97 mEq/L (ref 96–112)
Creatinine, Ser: 0.93 mg/dL (ref 0.40–1.20)
GFR: 60.26 mL/min (ref >60.00)
Glucose, Bld: 105 mg/dL — ABNORMAL HIGH (ref 70–99)
Potassium: 3.6 mEq/L (ref 3.5–5.1)
Sodium: 137 mEq/L (ref 135–145)

## 2017-05-20 LAB — TSH: TSH: 0.65 u[IU]/mL (ref 0.35–4.50)

## 2017-05-20 LAB — CBC WITH DIFFERENTIAL/PLATELET
Basophils Absolute: 0.1 10*3/uL (ref 0.0–0.1)
Basophils Relative: 1.5 % (ref 0.0–3.0)
Eosinophils Absolute: 0.1 10*3/uL (ref 0.0–0.7)
Eosinophils Relative: 2 % (ref 0.0–5.0)
HCT: 42.8 % (ref 36.0–46.0)
Hemoglobin: 14.4 g/dL (ref 12.0–15.0)
Lymphocytes Relative: 30.4 % (ref 12.0–46.0)
Lymphs Abs: 1.5 10*3/uL (ref 0.7–4.0)
MCHC: 33.7 g/dL (ref 30.0–36.0)
MCV: 103.8 fl — ABNORMAL HIGH (ref 78.0–100.0)
Monocytes Absolute: 0.5 10*3/uL (ref 0.1–1.0)
Monocytes Relative: 10.3 % (ref 3.0–12.0)
Neutro Abs: 2.7 10*3/uL (ref 1.4–7.7)
Neutrophils Relative %: 55.8 % (ref 43.0–77.0)
Platelets: 220 10*3/uL (ref 150.0–400.0)
RBC: 4.12 Mil/uL (ref 3.87–5.11)
RDW: 13 % (ref 11.5–15.5)
WBC: 4.8 10*3/uL (ref 4.0–10.5)

## 2017-05-20 NOTE — Assessment & Plan Note (Signed)
Eliquis 

## 2017-05-20 NOTE — Assessment & Plan Note (Signed)
Labs

## 2017-05-20 NOTE — Assessment & Plan Note (Signed)
Linzess 

## 2017-05-20 NOTE — Progress Notes (Signed)
Subjective:  Patient ID: [name], female    DOB: [birth year]  Age: 84 y.o. MRN: [id number]  CC: No chief complaint on file.   HPI [name] presents for constipation, dyslipidemia, anticoagulation f/u   Outpatient Medications Prior to Visit  Medication Sig Dispense Refill  . acetaminophen (TYLENOL) 500 MG tablet Take 500 mg by mouth daily as needed for mild pain.     [name] anastrozole (ARIMIDEX) 1 MG tablet Take 1 tablet (1 mg total) by mouth daily. 90 tablet 3  . apixaban (ELIQUIS) 5 MG TABS tablet Take 1 tablet (5 mg total) by mouth 2 (two) times daily. 180 tablet 2  . atorvastatin (LIPITOR) 10 MG tablet Take 1 tablet (10 mg total) by mouth every evening. 90 tablet 1  . B Complex-C (B-COMPLEX WITH VITAMIN C) tablet Take 1 tablet by mouth daily.    . busPIRone (BUSPAR) 7.5 MG tablet Take 1 tablet (7.5 mg total) by mouth 2 (two) times daily. 180 tablet 1  . Cholecalciferol (VITAMIN D3) 1000 UNITS tablet Take 1,000 Units by mouth daily.      [name] diltiazem (CARDIZEM CD) 120 MG 24 hr capsule TAKE 1 CAPSULE (120 MG TOTAL) BY MOUTH DAILY. 90 capsule 1  . doxycycline (VIBRA-TABS) 100 MG tablet Take 1 tablet (100 mg total) by mouth 2 (two) times daily. 60 tablet 5  . furosemide (LASIX) 40 MG tablet Take 1 tablet (40 mg total) by mouth as directed. 1 po on Mon-Wed-Fri in am 90 tablet 1  . levothyroxine (SYNTHROID, LEVOTHROID) 25 MCG tablet TAKE 1 TABLET BY MOUTH ONCE DAILY FOR THYROID 90 tablet 1  . linaclotide (LINZESS) 290 MCG CAPS capsule Take 1 capsule (290 mcg total) by mouth daily. 90 capsule 3  . metoprolol succinate (TOPROL-XL) 50 MG 24 hr tablet Take 1 tablet (50 mg total) by mouth 2 (two) times daily. Take with or immediately following a meal. 180 tablet 3  . Multiple Vitamins-Minerals (MULTIVITAMIN WITH MINERALS) tablet Take 1 tablet by mouth daily.    [name] omeprazole (PRILOSEC) 20 MG capsule Take 20 mg by mouth daily as needed (acid reflux).     [name] oxybutynin (DITROPAN) 5 MG tablet  Take 1 tablet (5 mg total) by mouth 2 (two) times daily. Yearly physical due in June must see MD for refills 60 tablet 5  . polyethylene glycol powder (GLYCOLAX/MIRALAX) powder Take 17-34 g by mouth 2 (two) times daily as needed for moderate constipation. 850 g 5  . potassium chloride SA (K-DUR,KLOR-CON) 20 MEQ tablet Take 1 tablet (20 mEq total) by mouth daily. 90 tablet 1  . temazepam (RESTORIL) 30 MG capsule Take 1 capsule (30 mg total) by mouth at bedtime. 30 capsule 5  . traMADol (ULTRAM) 50 MG tablet Take 1-2 tablets (50-100 mg total) by mouth every 6 (six) hours as needed. (Patient taking differently: Take 50-100 mg by mouth every 6 (six) hours as needed for moderate pain. ) 10 tablet 0  . vitamin C (ASCORBIC ACID) 500 MG tablet Take 500 mg by mouth daily.    [name] zinc sulfate (ZINC-220) 220 (50 Zn) MG capsule Take 220 mg by mouth daily.    . Ferrous Sulfate (IRON) 325 (65 FE) MG TABS Take 1 tablet by mouth daily.     No facility-administered medications prior to visit.     ROS Review of Systems  Constitutional: Negative for activity change, appetite change, chills, fatigue and unexpected weight change.  HENT: Negative for congestion, mouth sores and  sinus pressure.   Eyes: Negative for visual disturbance.  Respiratory: Negative for cough and chest tightness.   Gastrointestinal: Positive for constipation. Negative for abdominal pain and nausea.  Genitourinary: Negative for difficulty urinating, frequency and vaginal pain.  Musculoskeletal: Positive for back pain and gait problem.  Skin: Negative for pallor and rash.  Neurological: Negative for dizziness, tremors, weakness, numbness and headaches.  Psychiatric/Behavioral: Negative for confusion and sleep disturbance. The patient is nervous/anxious.     Objective:  BP 136/78 (BP Location: Left Arm, Patient Position: Sitting, Cuff Size: Large)   Pulse (!) 58   Temp 98 F (36.7 C) (Oral)   Ht 5\' 3"  (1.6 m)   Wt 177 lb (80.3 kg)    SpO2 98%   BMI 31.35 kg/m   BP Readings from Last 3 Encounters:  05/20/17 136/78  05/01/17 (!) 144/70  03/12/17 138/74    Wt Readings from Last 3 Encounters:  05/20/17 177 lb (80.3 kg)  05/01/17 180 lb (81.6 kg)  03/12/17 176 lb (79.8 kg)    Physical Exam  Constitutional: She appears well-developed. No distress.  HENT:  Head: Normocephalic.  Right Ear: External ear normal.  Left Ear: External ear normal.  Nose: Nose normal.  Mouth/Throat: Oropharynx is clear and moist.  Eyes: Pupils are equal, round, and reactive to light. Conjunctivae are normal. Right eye exhibits no discharge. Left eye exhibits no discharge.  Neck: Normal range of motion. Neck supple. No JVD present. No tracheal deviation present. No thyromegaly present.  Cardiovascular: Normal rate, regular rhythm and normal heart sounds.  Pulmonary/Chest: No stridor. No respiratory distress. She has no wheezes.  Abdominal: Soft. Bowel sounds are normal. She exhibits no distension and no mass. There is no tenderness. There is no rebound and no guarding.  Musculoskeletal: She exhibits tenderness. She exhibits no edema.  Lymphadenopathy:    She has no cervical adenopathy.  Neurological: She displays normal reflexes. No cranial nerve deficit. She exhibits normal muscle tone. Coordination abnormal.  Skin: No rash noted. No erythema.  Psychiatric: She has a normal mood and affect. Her behavior is normal. Judgment and thought content normal.   Obese Walker   Lab Results  Component Value Date   WBC 5.7 07/28/2016   HGB 13.9 07/28/2016   HCT 41.9 07/28/2016   PLT 215 07/28/2016   GLUCOSE 119 (H) 02/17/2017   CHOL 116 06/25/2012   TRIG 82 09/05/2013   HDL 41.50 06/25/2012   LDLDIRECT 110.3 05/24/2008   LDLCALC 66 06/25/2012   ALT 24 07/28/2016   AST 21 07/28/2016   NA 136 02/17/2017   K 3.7 02/17/2017   CL 98 02/17/2017   CREATININE 0.92 02/17/2017   BUN 13 02/17/2017   CO2 30 02/17/2017   TSH 0.54 04/21/2016    INR 1.7 05/01/2016   HGBA1C 6.1 (H) 09/08/2013    Mr Lumbar Spine W Wo Contrast  Result Date: 07/16/2016 CLINICAL DATA:  Acute midline low back pain with sciatica Creatinine was obtained on site at [facility] at [address] Results: Creatinine 0.9 mg/dL. EXAM: MRI LUMBAR SPINE WITHOUT AND WITH CONTRAST TECHNIQUE: Multiplanar and multiecho pulse sequences of the lumbar spine were obtained without and with intravenous contrast. CONTRAST:  14mL MULTIHANCE GADOBENATE DIMEGLUMINE 529 MG/ML IV SOLN COMPARISON:  No prior MRI.  CT abdomen pelvis 06/13/2016 FINDINGS: Segmentation:  Normal Alignment: Slight anterolisthesis L2-3. 5 mm anterolisthesis L3-4. Mild anterolisthesis L4-5 Vertebrae: Mild compression fracture of T12 with bone marrow edema. Mild edema in the superior  endplate of L1. These lesions are most likely due to benign fractures. These areas show diffuse enhancement and were not present on the recent CT. Conus medullaris: Extends to the L1-2 level and appears normal. Paraspinal and other soft tissues: No mass or adenopathy Disc levels: T11-12:  Mild disc and facet degeneration T12-L1:  Mild disc degeneration without stenosis L1-2:  Mild disc bulging and facet degeneration without stenosis L2-3: Diffuse bulging of the disc. Bilateral facet hypertrophy. Mild foraminal narrowing bilaterally. L3-4: Solid interbody fusion. 5 mm anterolisthesis. Disc degeneration and spurring. Bilateral facet degeneration. Moderately severe spinal stenosis. Subarticular stenosis bilaterally. L4-5: Solid interbody fusion. Mild anterolisthesis. Mild subarticular and foraminal narrowing on the left due to spurring L5-S1: Disc degeneration with large left lateral osteophyte. No significant foraminal encroachment. IMPRESSION: Mild compression fracture of T12 which appears recent. Mild bone marrow edema enhancement L1 vertebral body consistent with contusion. No retropulsion of bone into the canal. Moderate spinal  stenosis L3-4. Solid interbody fusion L3-4 and L4-5. Electronically Signed   By: Franchot Gallo M.D.   On: 07/16/2016 15:12    Assessment & Plan:   There are no diagnoses linked to this encounter. I have discontinued Mairlyn A. Pryde's Iron. I am also having her maintain her cholecalciferol, B-complex with vitamin C, acetaminophen, vitamin C, omeprazole, multivitamin with minerals, traMADol, atorvastatin, zinc sulfate, polyethylene glycol powder, linaclotide, apixaban, metoprolol succinate, temazepam, oxybutynin, levothyroxine, furosemide, busPIRone, doxycycline, anastrozole, diltiazem, and potassium chloride SA.  No orders of the defined types were placed in this encounter.    Follow-up: No follow-ups on file.  Walker Kehr, MD

## 2017-05-20 NOTE — Patient Instructions (Signed)
MC well w/Jill 

## 2017-05-20 NOTE — Telephone Encounter (Signed)
Charlotte with [facility] is calling to check status of RX. They are closed on Friday and pt is going to be out of medication tomorrow. Please advise.

## 2017-05-21 NOTE — Telephone Encounter (Signed)
MD has received request on his desktop for approval../lmb

## 2017-05-21 NOTE — Telephone Encounter (Signed)
Baldo Ash, pharmacist with [facility], calling to check the status of this refill. States they close at 1:30pm today and are closed tomorrow and the patient is supposed to run out of the tomorrow(05/22/17). Please advise.

## 2017-05-22 MED ORDER — TEMAZEPAM 30 MG PO CAPS: 30.0000 mg | ORAL_CAPSULE | Freq: Every day | ORAL | 5 refills | Status: DC

## 2017-05-23 ENCOUNTER — Emergency Department (HOSPITAL_COMMUNITY): Payer: Medicare Other

## 2017-05-23 ENCOUNTER — Other Ambulatory Visit: Payer: Self-pay

## 2017-05-23 ENCOUNTER — Encounter (HOSPITAL_COMMUNITY): Payer: Self-pay | Admitting: Emergency Medicine

## 2017-05-23 ENCOUNTER — Observation Stay (HOSPITAL_COMMUNITY)
Admission: EM | Admit: 2017-05-23 | Discharge: 2017-05-24 | Disposition: A | Discharge status: Home / Self Care | Payer: Medicare Other | Attending: Internal Medicine | Admitting: Internal Medicine

## 2017-05-23 DIAGNOSIS — Z79811 Long term (current) use of aromatase inhibitors: Secondary | ICD-10-CM | POA: Diagnosis not present

## 2017-05-23 DIAGNOSIS — E872 Acidosis: Secondary | ICD-10-CM | POA: Insufficient documentation

## 2017-05-23 DIAGNOSIS — K219 Gastro-esophageal reflux disease without esophagitis: Secondary | ICD-10-CM | POA: Insufficient documentation

## 2017-05-23 DIAGNOSIS — F419 Anxiety disorder, unspecified: Secondary | ICD-10-CM | POA: Diagnosis not present

## 2017-05-23 DIAGNOSIS — Z853 Personal history of malignant neoplasm of breast: Secondary | ICD-10-CM | POA: Insufficient documentation

## 2017-05-23 DIAGNOSIS — Z79899 Other long term (current) drug therapy: Secondary | ICD-10-CM | POA: Diagnosis not present

## 2017-05-23 DIAGNOSIS — J180 Bronchopneumonia, unspecified organism: Secondary | ICD-10-CM

## 2017-05-23 DIAGNOSIS — C50412 Malignant neoplasm of upper-outer quadrant of left female breast: Secondary | ICD-10-CM | POA: Diagnosis not present

## 2017-05-23 DIAGNOSIS — Z170 Estrogen receptor positive status [ER+]: Secondary | ICD-10-CM

## 2017-05-23 DIAGNOSIS — K5900 Constipation, unspecified: Secondary | ICD-10-CM | POA: Insufficient documentation

## 2017-05-23 DIAGNOSIS — R339 Retention of urine, unspecified: Secondary | ICD-10-CM | POA: Insufficient documentation

## 2017-05-23 DIAGNOSIS — M86662 Other chronic osteomyelitis, left tibia and fibula: Secondary | ICD-10-CM | POA: Diagnosis present

## 2017-05-23 DIAGNOSIS — Z7901 Long term (current) use of anticoagulants: Secondary | ICD-10-CM | POA: Diagnosis not present

## 2017-05-23 DIAGNOSIS — I4891 Unspecified atrial fibrillation: Secondary | ICD-10-CM | POA: Diagnosis not present

## 2017-05-23 DIAGNOSIS — E785 Hyperlipidemia, unspecified: Secondary | ICD-10-CM | POA: Diagnosis not present

## 2017-05-23 DIAGNOSIS — Z79890 Hormone replacement therapy (postmenopausal): Secondary | ICD-10-CM | POA: Insufficient documentation

## 2017-05-23 DIAGNOSIS — Z96641 Presence of right artificial hip joint: Secondary | ICD-10-CM | POA: Insufficient documentation

## 2017-05-23 DIAGNOSIS — E039 Hypothyroidism, unspecified: Secondary | ICD-10-CM | POA: Diagnosis not present

## 2017-05-23 DIAGNOSIS — J189 Pneumonia, unspecified organism: Secondary | ICD-10-CM | POA: Diagnosis not present

## 2017-05-23 DIAGNOSIS — J181 Lobar pneumonia, unspecified organism: Principal | ICD-10-CM

## 2017-05-23 DIAGNOSIS — R509 Fever, unspecified: Secondary | ICD-10-CM | POA: Diagnosis not present

## 2017-05-23 DIAGNOSIS — R531 Weakness: Secondary | ICD-10-CM | POA: Insufficient documentation

## 2017-05-23 DIAGNOSIS — I10 Essential (primary) hypertension: Secondary | ICD-10-CM | POA: Diagnosis not present

## 2017-05-23 DIAGNOSIS — R404 Transient alteration of awareness: Secondary | ICD-10-CM | POA: Diagnosis not present

## 2017-05-23 DIAGNOSIS — I4892 Unspecified atrial flutter: Secondary | ICD-10-CM | POA: Diagnosis present

## 2017-05-23 LAB — CBC
HCT: 37.3 % (ref 36.0–46.0)
Hemoglobin: 12.4 g/dL (ref 12.0–15.0)
MCH: 34.5 pg — ABNORMAL HIGH (ref 26.0–34.0)
MCHC: 33.2 g/dL (ref 30.0–36.0)
MCV: 103.9 fL — ABNORMAL HIGH (ref 78.0–100.0)
Platelets: 173 10*3/uL (ref 150–400)
RBC: 3.59 MIL/uL — ABNORMAL LOW (ref 3.87–5.11)
RDW: 13 % (ref 11.5–15.5)
WBC: 17.2 10*3/uL — ABNORMAL HIGH (ref 4.0–10.5)

## 2017-05-23 LAB — COMPREHENSIVE METABOLIC PANEL
ALT: 22 U/L (ref 14–54)
AST: 45 U/L — ABNORMAL HIGH (ref 15–41)
Albumin: 4.1 g/dL (ref 3.5–5.0)
Alkaline Phosphatase: 62 U/L (ref 38–126)
Anion gap: 13 (ref 5–15)
BUN: 16 mg/dL (ref 6–20)
CO2: 26 mmol/L (ref 22–32)
Calcium: 9.3 mg/dL (ref 8.9–10.3)
Chloride: 97 mmol/L — ABNORMAL LOW (ref 101–111)
Creatinine, Ser: 0.93 mg/dL (ref 0.44–1.00)
GFR calc Af Amer: >60 mL/min (ref >60)
GFR calc non Af Amer: 53 mL/min — ABNORMAL LOW (ref >60)
Glucose, Bld: 160 mg/dL — ABNORMAL HIGH (ref 65–99)
Potassium: 4.3 mmol/L (ref 3.5–5.1)
Sodium: 136 mmol/L (ref 135–145)
Total Bilirubin: 1.9 mg/dL — ABNORMAL HIGH (ref 0.3–1.2)
Total Protein: 7.8 g/dL (ref 6.5–8.1)

## 2017-05-23 LAB — URINALYSIS, ROUTINE W REFLEX MICROSCOPIC
Bilirubin Urine: NEGATIVE
Glucose, UA: NEGATIVE mg/dL
Hgb urine dipstick: NEGATIVE
Ketones, ur: NEGATIVE mg/dL
Leukocytes, UA: NEGATIVE
Nitrite: NEGATIVE
Protein, ur: NEGATIVE mg/dL
Specific Gravity, Urine: 1.006 (ref 1.005–1.030)
pH: 7 (ref 5.0–8.0)

## 2017-05-23 LAB — CBC WITH DIFFERENTIAL/PLATELET
Basophils Absolute: 0 10*3/uL (ref 0.0–0.1)
Basophils Relative: 0 %
Eosinophils Absolute: 0 10*3/uL (ref 0.0–0.7)
Eosinophils Relative: 0 %
HCT: 40.9 % (ref 36.0–46.0)
Hemoglobin: 13.6 g/dL (ref 12.0–15.0)
Lymphocytes Relative: 4 %
Lymphs Abs: 0.7 10*3/uL (ref 0.7–4.0)
MCH: 34.8 pg — ABNORMAL HIGH (ref 26.0–34.0)
MCHC: 33.3 g/dL (ref 30.0–36.0)
MCV: 104.6 fL — ABNORMAL HIGH (ref 78.0–100.0)
Monocytes Absolute: 1.4 10*3/uL — ABNORMAL HIGH (ref 0.1–1.0)
Monocytes Relative: 9 %
Neutro Abs: 13.9 10*3/uL — ABNORMAL HIGH (ref 1.7–7.7)
Neutrophils Relative %: 87 %
Platelets: 200 10*3/uL (ref 150–400)
RBC: 3.91 MIL/uL (ref 3.87–5.11)
RDW: 12.9 % (ref 11.5–15.5)
WBC: 15.9 10*3/uL — ABNORMAL HIGH (ref 4.0–10.5)

## 2017-05-23 LAB — PROTIME-INR
INR: 1.33
Prothrombin Time: 16.3 seconds — ABNORMAL HIGH (ref 11.4–15.2)

## 2017-05-23 LAB — HIV ANTIBODY (ROUTINE TESTING W REFLEX): HIV Screen 4th Generation wRfx: NONREACTIVE

## 2017-05-23 LAB — I-STAT CG4 LACTIC ACID, ED
Lactic Acid, Venous: 2.76 mmol/L (ref 0.5–1.9)
Lactic Acid, Venous: 2.53 mmol/L (ref 0.5–1.9)

## 2017-05-23 LAB — MRSA PCR SCREENING: MRSA by PCR: NEGATIVE

## 2017-05-23 LAB — STREP PNEUMONIAE URINARY ANTIGEN: Strep Pneumo Urinary Antigen: NEGATIVE

## 2017-05-23 LAB — LACTIC ACID, PLASMA: Lactic Acid, Venous: 2.5 mmol/L (ref 0.5–1.9)

## 2017-05-23 MED ORDER — VITAMIN D3 25 MCG (1000 UNIT) PO TABS
1000.0000 [IU] | ORAL_TABLET | Freq: Every day | ORAL | Status: DC
  Administered 2017-05-23 – 2017-05-24 (×2): 1000 [IU] via ORAL
  Filled 2017-05-23 (×2): qty 1

## 2017-05-23 MED ORDER — ATORVASTATIN CALCIUM 10 MG PO TABS
10.0000 mg | ORAL_TABLET | Freq: Every evening | ORAL | Status: DC
  Administered 2017-05-23: 10 mg via ORAL
  Filled 2017-05-23: qty 1

## 2017-05-23 MED ORDER — TEMAZEPAM 15 MG PO CAPS
30.0000 mg | ORAL_CAPSULE | Freq: Every day | ORAL | Status: DC
  Administered 2017-05-23: 30 mg via ORAL
  Filled 2017-05-23: qty 2

## 2017-05-23 MED ORDER — SODIUM CHLORIDE 0.9 % IV BOLUS (SEPSIS)
1000.0000 mL | Freq: Once | INTRAVENOUS | Status: AC
  Administered 2017-05-23: 1000 mL via INTRAVENOUS

## 2017-05-23 MED ORDER — ACETAMINOPHEN 500 MG PO TABS
1000.0000 mg | ORAL_TABLET | Freq: Once | ORAL | Status: AC
  Administered 2017-05-23: 1000 mg via ORAL
  Filled 2017-05-23: qty 2

## 2017-05-23 MED ORDER — METOPROLOL SUCCINATE ER 50 MG PO TB24
50.0000 mg | ORAL_TABLET | Freq: Two times a day (BID) | ORAL | Status: DC
  Administered 2017-05-23 – 2017-05-24 (×3): 50 mg via ORAL
  Filled 2017-05-23 (×3): qty 1

## 2017-05-23 MED ORDER — LEVOTHYROXINE SODIUM 25 MCG PO TABS
25.0000 ug | ORAL_TABLET | Freq: Every day | ORAL | Status: DC
  Administered 2017-05-23 – 2017-05-24 (×2): 25 ug via ORAL
  Filled 2017-05-23 (×2): qty 1

## 2017-05-23 MED ORDER — ADULT MULTIVITAMIN W/MINERALS CH
1.0000 | ORAL_TABLET | Freq: Every day | ORAL | Status: DC
  Administered 2017-05-23 – 2017-05-24 (×2): 1 via ORAL
  Filled 2017-05-23 (×2): qty 1

## 2017-05-23 MED ORDER — SODIUM CHLORIDE 0.9 % IV SOLN
500.0000 mg | Freq: Once | INTRAVENOUS | Status: AC
  Administered 2017-05-23: 500 mg via INTRAVENOUS
  Filled 2017-05-23: qty 500

## 2017-05-23 MED ORDER — ANASTROZOLE 1 MG PO TABS
1.0000 mg | ORAL_TABLET | Freq: Every day | ORAL | Status: DC
  Administered 2017-05-23 – 2017-05-24 (×2): 1 mg via ORAL
  Filled 2017-05-23 (×2): qty 1

## 2017-05-23 MED ORDER — POLYETHYLENE GLYCOL 3350 17 G PO PACK
17.0000 g | PACK | Freq: Two times a day (BID) | ORAL | Status: DC | PRN
  Administered 2017-05-23: 17 g via ORAL
  Filled 2017-05-23: qty 1

## 2017-05-23 MED ORDER — SODIUM CHLORIDE 0.9 % IV BOLUS (SEPSIS)
500.0000 mL | Freq: Once | INTRAVENOUS | Status: AC
  Administered 2017-05-23: 500 mL via INTRAVENOUS

## 2017-05-23 MED ORDER — BUSPIRONE HCL 5 MG PO TABS
7.5000 mg | ORAL_TABLET | Freq: Two times a day (BID) | ORAL | Status: DC
  Administered 2017-05-23 – 2017-05-24 (×3): 7.5 mg via ORAL
  Filled 2017-05-23 (×3): qty 2

## 2017-05-23 MED ORDER — AZITHROMYCIN 250 MG PO TABS: 500.0000 mg | ORAL_TABLET | ORAL | Status: DC

## 2017-05-23 MED ORDER — LINACLOTIDE 145 MCG PO CAPS
290.0000 ug | ORAL_CAPSULE | Freq: Every day | ORAL | Status: DC
  Administered 2017-05-23 – 2017-05-24 (×2): 290 ug via ORAL
  Filled 2017-05-23 (×2): qty 2

## 2017-05-23 MED ORDER — DILTIAZEM HCL ER COATED BEADS 120 MG PO CP24
120.0000 mg | ORAL_CAPSULE | Freq: Every day | ORAL | Status: DC
  Administered 2017-05-23 – 2017-05-24 (×2): 120 mg via ORAL
  Filled 2017-05-23 (×2): qty 1

## 2017-05-23 MED ORDER — OXYBUTYNIN CHLORIDE 5 MG PO TABS
5.0000 mg | ORAL_TABLET | Freq: Two times a day (BID) | ORAL | Status: DC
  Administered 2017-05-23 – 2017-05-24 (×3): 5 mg via ORAL
  Filled 2017-05-23 (×3): qty 1

## 2017-05-23 MED ORDER — TRAMADOL HCL 50 MG PO TABS: 50.0000 mg | ORAL_TABLET | Freq: Four times a day (QID) | ORAL | Status: DC | PRN

## 2017-05-23 MED ORDER — PANTOPRAZOLE SODIUM 40 MG PO TBEC
40.0000 mg | DELAYED_RELEASE_TABLET | Freq: Every day | ORAL | Status: DC
  Administered 2017-05-23 – 2017-05-24 (×2): 40 mg via ORAL
  Filled 2017-05-23 (×2): qty 1

## 2017-05-23 MED ORDER — SODIUM CHLORIDE 0.9 % IV SOLN
1.0000 g | INTRAVENOUS | Status: DC
  Administered 2017-05-24: 1 g via INTRAVENOUS
  Filled 2017-05-23: qty 1

## 2017-05-23 MED ORDER — APIXABAN 5 MG PO TABS
5.0000 mg | ORAL_TABLET | Freq: Two times a day (BID) | ORAL | Status: DC
  Administered 2017-05-23 – 2017-05-24 (×3): 5 mg via ORAL
  Filled 2017-05-23 (×3): qty 1

## 2017-05-23 MED ORDER — SODIUM CHLORIDE 0.9 % IV SOLN
2.0000 g | Freq: Once | INTRAVENOUS | Status: AC
  Administered 2017-05-23: 2 g via INTRAVENOUS
  Filled 2017-05-23: qty 20

## 2017-05-23 MED ORDER — DOXYCYCLINE HYCLATE 100 MG PO TABS
100.0000 mg | ORAL_TABLET | Freq: Two times a day (BID) | ORAL | Status: DC
  Administered 2017-05-23 – 2017-05-24 (×3): 100 mg via ORAL
  Filled 2017-05-23 (×3): qty 1

## 2017-05-23 NOTE — ED Triage Notes (Signed)
Patient here from [facility] independent living with complaints of weakness x2 days. Unable to get up off the toilet tonight. Alert per norm.

## 2017-05-23 NOTE — H&P (Signed)
History and Physical    [name] QQV:[id number] DOB: 16-Jul-1925 DOA: 05/23/2017  PCP: [name], MD  Patient coming from: [facility]  I have personally briefly reviewed patient's old medical records in [place]  Chief Complaint: Generalized Weakness  HPI: [name] is a 84 y.o. female with medical history significant of A.Fib on coumadin, BRCA s/p lumpectomy and Arimidex, chronic MSSA osteomyelitis of L tibia following an open fx, stable on Doxycycline for ~4 years now.  Patient has been feeling poorly for past 2 days.  Non focal weakness and fever.  Had incontinence of urine tonight which is unusual for her.  Went to bathroom but was unable to stand from toilet on her own due to generalized weakness.  EMS summoned and patient brought to ED.  Denies numbness in legs, denies back pain.   ED Course: Tm 100.4, WBC 15.9k, lactate 2.7.  Given 2.5L NS bolus.  O2 sat 88% on RA, sating low 90s on 2L via [place].  No cough nor reported SOB, but CXR seems to confirm L lingular PNA.  Given doses of rocephin and azithro.   Review of Systems: As per HPI otherwise 10 point review of systems negative.   Past Medical History:  Diagnosis Date  . Anticoagulated on Coumadin   . Anxiety    denies  . Bilateral edema of lower extremity   . Cancer ([place])    left breast  . Chronic constipation   . Diverticulosis of colon   . GERD (gastroesophageal reflux disease)   . H/O hiatal hernia   . History of cellulitis    LEFT LOWER LEG --  SEPT 2015  . History of colitis    DX  2003  WITH ISCHEMIC COLITIS  . History of diverticulitis of colon    perferated diverticulitis w/ abscess 08-25-2013 drain placed --  resolved without surgical intervention  . History of GI bleed    2011--- UPPER SECONARY GASTRIC ULCER FROM NSAID USE  . History of Helicobacter pylori infection    2011  . History of ulcer of lower limb    2012   RIGHT LOWER EXTREMITIY--  STATSIS ULCER  . Hyperlipidemia    . Hypertension   . Hypothyroidism   . Iron deficiency anemia 2011   elevated MCV  . LBP (low back pain)    severe lumbar spondylosis s/p L3/L4,L4/L5 fusion 12/10  . Lumbar spondylolysis   . OA (osteoarthritis)    "hands" (08/25/2013)  . Open wound of left lower leg   . Osteopenia   . Persistent atrial fibrillation ([place])    CARDIOLOGIST-   DR MCALANY  . RBBB    at fib  . Urinary frequency     Past Surgical History:  Procedure Laterality Date  . APPLICATION OF A-CELL OF EXTREMITY Left 06/21/2013   Procedure: APPLICATION OF A-CELL OF EXTREMITY;  Surgeon: Theodoro Kos, DO;  Location: [place];  Service: Plastics;  Laterality: Left;  . APPLICATION OF A-CELL OF EXTREMITY Left 08/22/2013   Procedure: APPLICATION OF A-CELL/VAC TO LOWER LEFT LEG WOUND;  Surgeon: Theodoro Kos, DO;  Location: [place];  Service: Plastics;  Laterality: Left;  . APPLICATION OF A-CELL OF EXTREMITY Left 02/06/2014   Procedure: WITH PLACEMENT OF A -CELL ;  Surgeon: Theodoro Kos, DO;  Location: [place];  Service: Plastics;  Laterality: Left;  . APPLICATION OF A-CELL OF EXTREMITY Left 08/09/2014   Procedure: PLACEMENT OF A CELL;  Surgeon: Theodoro Kos,  DO;  Location: [place];  Service: Plastics;  Laterality: Left;  . APPLICATION OF WOUND VAC Left 06/16/2013   Procedure: APPLICATION OF WOUND VAC;  Surgeon: Meredith Pel, MD;  Location: [place];  Service: Orthopedics;  Laterality: Left;  . APPLICATION OF WOUND VAC Left 06/21/2013   Procedure: APPLICATION OF WOUND VAC;  Surgeon: Theodoro Kos, DO;  Location: [place];  Service: Plastics;  Laterality: Left;  . BIOPSY BREAST  07/25/2015   LEFT RADIOACTIVE SEED GUIDED EXCISIONAL BREAST BIOPSY (Left) as a surgical intervention  . EXTERNAL FIXATION LEG Left 06/16/2013   Procedure: EXTERNAL FIXATION LEG;  Surgeon: Meredith Pel, MD;  Location: [place];  Service: Orthopedics;  Laterality: Left;  . EXTERNAL FIXATION REMOVAL Left 06/21/2013    Procedure: REMOVAL EXTERNAL FIXATION LEG;  Surgeon: Meredith Pel, MD;  Location: [place];  Service: Orthopedics;  Laterality: Left;  . EYE SURGERY Bilateral    cataract removal  . HARDWARE REMOVAL Left 05/23/2014   Procedure: HARDWARE REMOVAL;  Surgeon: Meredith Pel, MD;  Location: [place];  Service: Orthopedics;  Laterality: Left;  REMOVAL OF HARDWARE LEFT TIBIA  . I&D EXTREMITY Left 06/16/2013   Procedure: IRRIGATION AND DEBRIDEMENT EXTREMITY;  Surgeon: Meredith Pel, MD;  Location: [place];  Service: Orthopedics;  Laterality: Left;  . I&D EXTREMITY Left 02/06/2014   Procedure: IRRIGATION AND DEBRIDEMENT LEFT LEG WOUND ;  Surgeon: Theodoro Kos, DO;  Location: [place];  Service: Plastics;  Laterality: Left;  . I&D EXTREMITY Left 08/09/2014   Procedure: IRRIGATION AND DEBRIDEMENT  OF LEFT LOWER LEG;  Surgeon: Theodoro Kos, DO;  Location: [place];  Service: Plastics;  Laterality: Left;  . LUMBAR FUSION  01-02-2009   L3-L5  . RADIOACTIVE SEED GUIDED EXCISIONAL BREAST BIOPSY Left 07/25/2015   Procedure: LEFT RADIOACTIVE SEED GUIDED EXCISIONAL BREAST BIOPSY;  Surgeon: Rolm Bookbinder, MD;  Location: [place];  Service: General;  Laterality: Left;  . RE-EXCISION OF BREAST LUMPECTOMY Left 09/05/2015   Procedure: RE-EXCISION OF LEFT BREAST LUMPECTOMY;  Surgeon: Rolm Bookbinder, MD;  Location: [place];  Service: General;  Laterality: Left;  . SHOULDER OPEN ROTATOR CUFF REPAIR Left 1997  . TIBIA IM NAIL INSERTION Left 06/21/2013   Procedure: INTRAMEDULLARY (IM) NAIL TIBIAL;  Surgeon: Meredith Pel, MD;  Location: [place];  Service: Orthopedics;  Laterality: Left;  . TONSILLECTOMY  AS CHILD  . TOTAL HIP ARTHROPLASTY Right 06-07-2010  . TRANSTHORACIC ECHOCARDIOGRAM  09-11-2010   DR MCALHANY   LVSF  55-60%/  MILD MV CALCIFICATION WITH NO STENOSIS/  MILD MR & TR / MODERATE LAE/  MODERATE TO SEVERE RAE  . UPPER GI ENDOSCOPY  03-29-2010     reports that she has never smoked. She has  never used smokeless tobacco. She reports that she does not drink alcohol or use drugs.  Allergies  Allergen Reactions  . Zosyn [Piperacillin Sod-Tazobactam So] Hives, Rash and Other (See Comments)    Morbilliform eruption with itching  . Atenolol Nausea And Vomiting  . Clarithromycin Nausea And Vomiting  . Codeine Sulfate Nausea Only  . Levaquin [Levofloxacin] Nausea Only  . Macrodantin Other (See Comments)    UNSPECIFIED   . Oxycodone-Acetaminophen Nausea And Vomiting  . Percocet [Oxycodone-Acetaminophen] Nausea And Vomiting    Family History  Problem Relation Age of Onset  . Cancer Father   . Hypertension Other      Prior to Admission medications   Medication Sig Start Date End Date Taking? Authorizing Provider  acetaminophen (TYLENOL) 500 MG tablet Take 500 mg by mouth daily as needed for mild pain.    Yes [provider]  anastrozole (ARIMIDEX) 1 MG tablet Take 1 tablet (1 mg total) by mouth daily. 04/06/17  Yes Magrinat, Virgie Dad, MD  apixaban (ELIQUIS) 5 MG TABS tablet Take 1 tablet (5 mg total) by mouth 2 (two) times daily. 10/23/16  Yes Burnell Blanks, MD  atorvastatin (LIPITOR) 10 MG tablet Take 1 tablet (10 mg total) by mouth every evening. 06/04/16  Yes [name], [name], MD  B Complex-C (B-COMPLEX WITH VITAMIN C) tablet Take 1 tablet by mouth daily.   Yes [provider]  busPIRone (BUSPAR) 7.5 MG tablet Take 1 tablet (7.5 mg total) by mouth 2 (two) times daily. 02/09/17  Yes [name], [name], MD  Cholecalciferol (VITAMIN D3) 1000 UNITS tablet Take 1,000 Units by mouth daily.     Yes [provider]  diltiazem (CARDIZEM CD) 120 MG 24 hr capsule TAKE 1 CAPSULE (120 MG TOTAL) BY MOUTH DAILY. 04/14/17  Yes [name], [name], MD  furosemide (LASIX) 40 MG tablet Take 1 tablet (40 mg total) by mouth as directed. 1 po on Mon-Wed-Fri in am 02/02/17  Yes [name], [name], MD  levothyroxine (SYNTHROID, LEVOTHROID) 25 MCG tablet TAKE 1  TABLET BY MOUTH ONCE DAILY FOR THYROID 02/02/17  Yes [name], [name], MD  linaclotide (LINZESS) 290 MCG CAPS capsule Take 1 capsule (290 mcg total) by mouth daily. 07/03/16  Yes [name], [name], MD  metoprolol succinate (TOPROL-XL) 50 MG 24 hr tablet Take 1 tablet (50 mg total) by mouth 2 (two) times daily. Take with or immediately following a meal. 10/28/16  Yes [name], [name], MD  Multiple Vitamins-Minerals (MULTIVITAMIN WITH MINERALS) tablet Take 1 tablet by mouth daily.   Yes [provider]  omeprazole (PRILOSEC) 20 MG capsule Take 20 mg by mouth daily as needed (acid reflux).    Yes [provider]  oxybutynin (DITROPAN) 5 MG tablet Take 1 tablet (5 mg total) by mouth 2 (two) times daily. Yearly physical due in June must see MD for refills 01/06/17  Yes [name], [name], MD  polyethylene glycol powder (GLYCOLAX/MIRALAX) powder Take 17-34 g by mouth 2 (two) times daily as needed for moderate constipation. 07/03/16  Yes [name], [name], MD  potassium chloride SA (K-DUR,KLOR-CON) 20 MEQ tablet Take 1 tablet (20 mEq total) by mouth daily. 05/04/17  Yes [name], [name], MD  temazepam (RESTORIL) 30 MG capsule Take 1 capsule (30 mg total) by mouth at bedtime. 05/22/17  Yes [name], [name], MD  vitamin C (ASCORBIC ACID) 500 MG tablet Take 500 mg by mouth daily.   Yes [provider]  zinc sulfate (ZINC-220) 220 (50 Zn) MG capsule Take 220 mg by mouth daily.   Yes [provider]  doxycycline (VIBRA-TABS) 100 MG tablet Take 1 tablet (100 mg total) by mouth 2 (two) times daily. Patient not taking: Reported on 05/23/2017 02/09/17   [name], MD    Physical Exam: Vitals:   05/23/17 0212 05/23/17 0339 05/23/17 0401 05/23/17 0401  BP: (!) 169/71 (!) 166/121    Pulse: 94 80    Resp: 18 20    Temp: (!) 100.4 F (38 C)     TempSrc: Rectal     SpO2: 95% 93% (!) 88% 92%    Constitutional: NAD, calm, comfortable Eyes: PERRL, lids  and conjunctivae normal ENMT: Mucous membranes are moist. Posterior pharynx clear  of any exudate or lesions.Normal dentition.  Neck: normal, supple, no masses, no thyromegaly Respiratory: clear to auscultation bilaterally, no wheezing, no crackles. Normal respiratory effort. No accessory muscle use.  Cardiovascular: Regular rate and rhythm, no murmurs / rubs / gallops. No extremity edema. 2+ pedal pulses. No carotid bruits.  Abdomen: no tenderness, no masses palpated. No hepatosplenomegaly. Bowel sounds positive.  Musculoskeletal: no clubbing / cyanosis. No joint deformity upper and lower extremities. Good ROM, no contractures. Normal muscle tone.  Skin: no rashes, lesions, ulcers. No induration, chronic scar of LLE, no erythema, swelling, or new skin changes Neurologic: CN 2-12 grossly intact. Sensation intact, DTR normal. Strength 5/5 in all 4. Grossly non-focal. Psychiatric: Normal judgment and insight. Alert and oriented x 3. Normal mood.    Labs on Admission: I have personally reviewed following labs and imaging studies  CBC: Recent Labs  Lab 05/20/17 1517 05/23/17 0226  WBC 4.8 15.9*  NEUTROABS 2.7 13.9*  HGB 14.4 13.6  HCT 42.8 40.9  MCV 103.8* 104.6*  PLT 220.0 371   Basic Metabolic Panel: Recent Labs  Lab 05/20/17 1517 05/23/17 0226  NA 137 136  K 3.6 4.3  CL 97 97*  CO2 31 26  GLUCOSE 105* 160*  BUN 14 16  CREATININE 0.93 0.93  CALCIUM 9.6 9.3   GFR: Estimated Creatinine Clearance: 41.2 mL/min (by C-G formula based on SCr of 0.93 mg/dL). Liver Function Tests: Recent Labs  Lab 05/23/17 0226  AST 45*  ALT 22  ALKPHOS 62  BILITOT 1.9*  PROT 7.8  ALBUMIN 4.1   No results for input(s): LIPASE, AMYLASE in the last 168 hours. No results for input(s): AMMONIA in the last 168 hours. Coagulation Profile: Recent Labs  Lab 05/23/17 0226  INR 1.33   Cardiac Enzymes: No results for input(s): CKTOTAL, CKMB, CKMBINDEX, TROPONINI in the last 168 hours. BNP  (last 3 results) No results for input(s): PROBNP in the last 8760 hours. HbA1C: No results for input(s): HGBA1C in the last 72 hours. CBG: No results for input(s): GLUCAP in the last 168 hours. Lipid Profile: No results for input(s): CHOL, HDL, LDLCALC, TRIG, CHOLHDL, LDLDIRECT in the last 72 hours. Thyroid Function Tests: Recent Labs    05/20/17 1517  TSH 0.65   Anemia Panel: No results for input(s): VITAMINB12, FOLATE, FERRITIN, TIBC, IRON, RETICCTPCT in the last 72 hours. Urine analysis:    Component Value Date/Time   COLORURINE STRAW (A) 05/23/2017 0228   APPEARANCEUR CLEAR 05/23/2017 0228   LABSPEC 1.006 05/23/2017 0228   PHURINE 7.0 05/23/2017 0228   GLUCOSEU NEGATIVE 05/23/2017 0228   GLUCOSEU NEGATIVE 01/17/2015 1415   HGBUR NEGATIVE 05/23/2017 0228   BILIRUBINUR NEGATIVE 05/23/2017 0228   KETONESUR NEGATIVE 05/23/2017 0228   PROTEINUR NEGATIVE 05/23/2017 0228   UROBILINOGEN 0.2 01/17/2015 1415   NITRITE NEGATIVE 05/23/2017 0228   LEUKOCYTESUR NEGATIVE 05/23/2017 0228    Radiological Exams on Admission: Dg Chest 2 View  Result Date: 05/23/2017 CLINICAL DATA:  Weakness and fever EXAM: CHEST - 2 VIEW COMPARISON:  03/06/2015 FINDINGS: Mild cardiomegaly. Left infrahilar suspected consolidation. No pleural effusion. No pneumothorax. IMPRESSION: Suspected left infrahilar consolidation, possible infiltrate. Short interval radiographic follow-up recommended to ensure clearing. Electronically Signed   By: Donavan Foil M.D.   On: 05/23/2017 02:50    EKG: Independently reviewed.  Assessment/Plan Principal Problem:   Community acquired pneumonia of left lower lobe of lung ([place]) Active Problems:   Atrial flutter ([place])   Malignant neoplasm of upper-outer quadrant of left  breast in female, estrogen receptor positive ([place])   Chronic osteomyelitis of left tibia ([place])    1. CAP of LLL - 1. PNA pathway 2. Add rocephin to her chronic doxycycline, will hold off on continuing  azithromycin for now since on doxy chronically. 3. Cultures pending 4. IVF: 2.5L in ED and will hold her home Lasix 2. A.Flutter - 1. Continue rate control meds 2. Continue eliquis 3. BRCA - continue Arimidex 4. Chronic osteomyelitis of L tibia with MSSA - 1. Continue doxycycline  DVT prophylaxis: Eliquis Code Status: Full - confirmed with patient Family Communication: Husband at bedside Disposition Plan: ALF after admit Consults called: None Admission status: Place in obs - convert to IP if stays through tomorrow.   Etta Quill DO [facility] Pager [phone]  If 7AM-7PM, please contact day team taking care of patient [email or web address] Password [email or web address]  05/23/2017, 4:20 AM

## 2017-05-23 NOTE — ED Provider Notes (Addendum)
[facility] DEPT Provider Note   CSN: [id number] Arrival date & time: 05/23/17  0202     History   Chief Complaint Chief Complaint  Patient presents with  . Weakness    HPI [name] is a 84 y.o. female.  Chief complaint is weakness, fever.  HPI: 84 year old female.  Via EMS.  From [facility] assisted living.  Accompanied by her husband.  She has not felt well for the last 2 days.  Nonspecific weakness.  Had incontinence of her urine tonight at home which is unusual for her.  Went to the bathroom.  However, was unable to stand from the toilet on her own due to weakness.  EMS was summoned.  Brought here.  He is awake and alert.  She denies any difficulty breathing.  Husband reiterates that she has not felt well for the last 2-1/2 days.  Had a normal routine primary care evaluation with Dr. Delorse Limber on Tuesday  Past Medical History:  Diagnosis Date  . Anticoagulated on Coumadin   . Anxiety    denies  . Bilateral edema of lower extremity   . Cancer ([place])    left breast  . Chronic constipation   . Diverticulosis of colon   . GERD (gastroesophageal reflux disease)   . H/O hiatal hernia   . History of cellulitis    LEFT LOWER LEG --  SEPT 2015  . History of colitis    DX  2003  WITH ISCHEMIC COLITIS  . History of diverticulitis of colon    perferated diverticulitis w/ abscess 08-25-2013 drain placed --  resolved without surgical intervention  . History of GI bleed    2011--- UPPER SECONARY GASTRIC ULCER FROM NSAID USE  . History of Helicobacter pylori infection    2011  . History of ulcer of lower limb    2012   RIGHT LOWER EXTREMITIY--  STATSIS ULCER  . Hyperlipidemia   . Hypertension   . Hypothyroidism   . Iron deficiency anemia 2011   elevated MCV  . LBP (low back pain)    severe lumbar spondylosis s/p L3/L4,L4/L5 fusion 12/10  . Lumbar spondylolysis   . OA (osteoarthritis)    "hands" (08/25/2013)  . Open wound of left lower leg    . Osteopenia   . Persistent atrial fibrillation ([place])    CARDIOLOGIST-   DR MCALANY  . RBBB    at fib  . Urinary frequency     Patient Active Problem List   Diagnosis Date Noted  . Osteoporosis 08/22/2016  . Malignant neoplasm of upper-outer quadrant of left breast in female, estrogen receptor positive ([place]) 08/10/2015  . Breast mass in female 07/25/2015  . Left breast mass 07/05/2015  . CAP (community acquired pneumonia) 03/06/2015  . Incontinence in female 07/05/2014  . Edema 01/17/2014  . Morbilliform rash 09/06/2013  . Sepsis ([place]) 08/25/2013  . Metabolic acidosis 23/55/7322  . Diverticulitis of colon with perforation 08/25/2013  . Constipation 06/22/2013  . Open tibial fracture 06/17/2013  . Encounter for therapeutic drug monitoring 02/16/2013  . Well adult exam 07/21/2012  . Paresthesia 01/28/2012  . Chronic venous insufficiency 11/11/2010  . Actinic keratoses 05/30/2010  . Long term current use of anticoagulant 03/12/2010  . PEPTIC ULCER DISEASE 11/22/2009  . Iron deficiency anemia 10/31/2009  . HIP PAIN 10/31/2009  . Hypothyroidism 10/25/2009  . Disorder resulting from impaired renal function 03/19/2009  . INSOMNIA, PERSISTENT 11/07/2008  . Low back pain 09/04/2008  . Atrial flutter ([place])  05/31/2008  . Essential hypertension 11/30/2006  . Osteoarthritis 11/30/2006  . Disorder of bone and cartilage 11/30/2006    Past Surgical History:  Procedure Laterality Date  . APPLICATION OF A-CELL OF EXTREMITY Left 06/21/2013   Procedure: APPLICATION OF A-CELL OF EXTREMITY;  Surgeon: Theodoro Kos, DO;  Location: [place];  Service: Plastics;  Laterality: Left;  . APPLICATION OF A-CELL OF EXTREMITY Left 08/22/2013   Procedure: APPLICATION OF A-CELL/VAC TO LOWER LEFT LEG WOUND;  Surgeon: Theodoro Kos, DO;  Location: [place];  Service: Plastics;  Laterality: Left;  . APPLICATION OF A-CELL OF EXTREMITY Left 02/06/2014   Procedure: WITH PLACEMENT OF A -CELL ;   Surgeon: Theodoro Kos, DO;  Location: [place];  Service: Plastics;  Laterality: Left;  . APPLICATION OF A-CELL OF EXTREMITY Left 08/09/2014   Procedure: PLACEMENT OF A CELL;  Surgeon: Theodoro Kos, DO;  Location: [place];  Service: Plastics;  Laterality: Left;  . APPLICATION OF WOUND VAC Left 06/16/2013   Procedure: APPLICATION OF WOUND VAC;  Surgeon: Meredith Pel, MD;  Location: [place];  Service: Orthopedics;  Laterality: Left;  . APPLICATION OF WOUND VAC Left 06/21/2013   Procedure: APPLICATION OF WOUND VAC;  Surgeon: Theodoro Kos, DO;  Location: [place];  Service: Plastics;  Laterality: Left;  . BIOPSY BREAST  07/25/2015   LEFT RADIOACTIVE SEED GUIDED EXCISIONAL BREAST BIOPSY (Left) as a surgical intervention  . EXTERNAL FIXATION LEG Left 06/16/2013   Procedure: EXTERNAL FIXATION LEG;  Surgeon: Meredith Pel, MD;  Location: [place];  Service: Orthopedics;  Laterality: Left;  . EXTERNAL FIXATION REMOVAL Left 06/21/2013   Procedure: REMOVAL EXTERNAL FIXATION LEG;  Surgeon: Meredith Pel, MD;  Location: [place];  Service: Orthopedics;  Laterality: Left;  . EYE SURGERY Bilateral    cataract removal  . HARDWARE REMOVAL Left 05/23/2014   Procedure: HARDWARE REMOVAL;  Surgeon: Meredith Pel, MD;  Location: [place];  Service: Orthopedics;  Laterality: Left;  REMOVAL OF HARDWARE LEFT TIBIA  . I&D EXTREMITY Left 06/16/2013   Procedure: IRRIGATION AND DEBRIDEMENT EXTREMITY;  Surgeon: Meredith Pel, MD;  Location: [place];  Service: Orthopedics;  Laterality: Left;  . I&D EXTREMITY Left 02/06/2014   Procedure: IRRIGATION AND DEBRIDEMENT LEFT LEG WOUND ;  Surgeon: Theodoro Kos, DO;  Location: [place];  Service: Plastics;  Laterality: Left;  . I&D EXTREMITY Left 08/09/2014   Procedure: IRRIGATION AND DEBRIDEMENT  OF LEFT LOWER LEG;  Surgeon: Theodoro Kos, DO;  Location: [place];  Service: Plastics;  Laterality: Left;  . LUMBAR FUSION  01-02-2009   L3-L5  .  RADIOACTIVE SEED GUIDED EXCISIONAL BREAST BIOPSY Left 07/25/2015   Procedure: LEFT RADIOACTIVE SEED GUIDED EXCISIONAL BREAST BIOPSY;  Surgeon: Rolm Bookbinder, MD;  Location: [place];  Service: General;  Laterality: Left;  . RE-EXCISION OF BREAST LUMPECTOMY Left 09/05/2015   Procedure: RE-EXCISION OF LEFT BREAST LUMPECTOMY;  Surgeon: Rolm Bookbinder, MD;  Location: [place];  Service: General;  Laterality: Left;  . SHOULDER OPEN ROTATOR CUFF REPAIR Left 1997  . TIBIA IM NAIL INSERTION Left 06/21/2013   Procedure: INTRAMEDULLARY (IM) NAIL TIBIAL;  Surgeon: Meredith Pel, MD;  Location: [place];  Service: Orthopedics;  Laterality: Left;  . TONSILLECTOMY  AS CHILD  . TOTAL HIP ARTHROPLASTY Right 06-07-2010  . TRANSTHORACIC ECHOCARDIOGRAM  09-11-2010   DR MCALHANY   LVSF  55-60%/  MILD MV CALCIFICATION WITH NO STENOSIS/  MILD MR & TR / MODERATE LAE/  MODERATE TO SEVERE RAE  . UPPER GI ENDOSCOPY  03-29-2010     OB History   None      Home Medications    Prior to Admission medications   Medication Sig Start Date End Date Taking? Authorizing Provider  acetaminophen (TYLENOL) 500 MG tablet Take 500 mg by mouth daily as needed for mild pain.     [provider]  anastrozole (ARIMIDEX) 1 MG tablet Take 1 tablet (1 mg total) by mouth daily. 04/06/17   Magrinat, Virgie Dad, MD  apixaban (ELIQUIS) 5 MG TABS tablet Take 1 tablet (5 mg total) by mouth 2 (two) times daily. 10/23/16   Burnell Blanks, MD  atorvastatin (LIPITOR) 10 MG tablet Take 1 tablet (10 mg total) by mouth every evening. 06/04/16   [name], [name], MD  B Complex-C (B-COMPLEX WITH VITAMIN C) tablet Take 1 tablet by mouth daily.    [provider]  busPIRone (BUSPAR) 7.5 MG tablet Take 1 tablet (7.5 mg total) by mouth 2 (two) times daily. 02/09/17   [name], [name], MD  Cholecalciferol (VITAMIN D3) 1000 UNITS tablet Take 1,000 Units by mouth daily.      [provider]  diltiazem (CARDIZEM CD) 120  MG 24 hr capsule TAKE 1 CAPSULE (120 MG TOTAL) BY MOUTH DAILY. 04/14/17   [name], [name], MD  doxycycline (VIBRA-TABS) 100 MG tablet Take 1 tablet (100 mg total) by mouth 2 (two) times daily. 02/09/17   [name], [name], MD  furosemide (LASIX) 40 MG tablet Take 1 tablet (40 mg total) by mouth as directed. 1 po on Mon-Wed-Fri in am 02/02/17   [name], [name], MD  levothyroxine (SYNTHROID, LEVOTHROID) 25 MCG tablet TAKE 1 TABLET BY MOUTH ONCE DAILY FOR THYROID 02/02/17   [name], [name], MD  linaclotide (LINZESS) 290 MCG CAPS capsule Take 1 capsule (290 mcg total) by mouth daily. 07/03/16   [name], [name], MD  metoprolol succinate (TOPROL-XL) 50 MG 24 hr tablet Take 1 tablet (50 mg total) by mouth 2 (two) times daily. Take with or immediately following a meal. 10/28/16   [name], [name], MD  Multiple Vitamins-Minerals (MULTIVITAMIN WITH MINERALS) tablet Take 1 tablet by mouth daily.    [provider]  omeprazole (PRILOSEC) 20 MG capsule Take 20 mg by mouth daily as needed (acid reflux).     [provider]  oxybutynin (DITROPAN) 5 MG tablet Take 1 tablet (5 mg total) by mouth 2 (two) times daily. Yearly physical due in June must see MD for refills 01/06/17   [name], [name], MD  polyethylene glycol powder (GLYCOLAX/MIRALAX) powder Take 17-34 g by mouth 2 (two) times daily as needed for moderate constipation. 07/03/16   [name], [name], MD  potassium chloride SA (K-DUR,KLOR-CON) 20 MEQ tablet Take 1 tablet (20 mEq total) by mouth daily. 05/04/17   [name], [name], MD  temazepam (RESTORIL) 30 MG capsule Take 1 capsule (30 mg total) by mouth at bedtime. 05/22/17   [name], [name], MD  traMADol (ULTRAM) 50 MG tablet Take 1-2 tablets (50-100 mg total) by mouth every 6 (six) hours as needed. Patient taking differently: Take 50-100 mg by mouth every 6 (six) hours as needed for moderate pain.  09/05/15   Rolm Bookbinder, MD  vitamin C (ASCORBIC ACID)  500 MG tablet Take 500 mg by mouth daily.    [provider]  zinc sulfate (ZINC-220) 220 (50 Zn) MG capsule Take 220 mg by mouth daily.  [provider]    Family History Family History  Problem Relation Age of Onset  . Cancer Father   . Hypertension Other     Social History Social History   Tobacco Use  . Smoking status: Never Smoker  . Smokeless tobacco: Never Used  Substance Use Topics  . Alcohol use: No    Comment: 1 glass occ wine 08/03/14- none in a year  . Drug use: No     Allergies   Zosyn [piperacillin sod-tazobactam so]; Atenolol; Clarithromycin; Codeine sulfate; Levaquin [levofloxacin]; Macrodantin; Oxycodone-acetaminophen; and Percocet [oxycodone-acetaminophen]   Review of Systems Review of Systems  Constitutional: Positive for activity change, appetite change, fatigue and fever. Negative for chills and diaphoresis.  HENT: Negative for mouth sores, sore throat and trouble swallowing.   Eyes: Negative for visual disturbance.  Respiratory: Negative for cough, chest tightness, shortness of breath and wheezing.   Cardiovascular: Negative for chest pain.  Gastrointestinal: Negative for abdominal distention, abdominal pain, diarrhea, nausea and vomiting.  Endocrine: Negative for polydipsia, polyphagia and polyuria.  Genitourinary: Negative for dysuria, frequency and hematuria.       Incontinence of urine  Musculoskeletal: Negative for gait problem.  Skin: Negative for color change, pallor and rash.  Neurological: Positive for weakness. Negative for dizziness, syncope, light-headedness and headaches.  Hematological: Does not bruise/bleed easily.  Psychiatric/Behavioral: Negative for behavioral problems and confusion.     Physical Exam Updated Vital Signs BP (!) 169/71 (BP Location: Right Arm)   Pulse 94   Temp (!) 100.4 F (38 C) (Rectal)   Resp 18   SpO2 95%   Physical Exam  Constitutional: She is oriented to person, place, and time.  She appears well-developed and well-nourished. No distress.  Awake and alert.  Lucid.  Offers details of her own history.  HENT:  Head: Normocephalic.  Eyes: Pupils are equal, round, and reactive to light. Conjunctivae are normal. No scleral icterus.  Neck: Normal range of motion. Neck supple. No thyromegaly present.  Cardiovascular: Normal rate and regular rhythm. Exam reveals no gallop and no friction rub.  No murmur heard. Not tachycardic or tachypneic  Pulmonary/Chest: Effort normal and breath sounds normal. No respiratory distress. She has no wheezes. She has no rales.  Abdominal: Soft. Bowel sounds are normal. She exhibits no distension. There is no tenderness. There is no rebound.  Musculoskeletal: Normal range of motion.  Neurological: She is alert and oriented to person, place, and time.  Skin: Skin is warm and dry. No rash noted.  Warm to the touch.  Dry.  No rash.  Psychiatric: She has a normal mood and affect. Her behavior is normal.     ED Treatments / Results  Labs (all labs ordered are listed, but only abnormal results are displayed) Labs Reviewed  COMPREHENSIVE METABOLIC PANEL - Abnormal; Notable for the following components:      Result Value   Chloride 97 (*)    Glucose, Bld 160 (*)    AST 45 (*)    Total Bilirubin 1.9 (*)    GFR calc non Af Amer 53 (*)    All other components within normal limits  CBC WITH DIFFERENTIAL/PLATELET - Abnormal; Notable for the following components:   WBC 15.9 (*)    MCV 104.6 (*)    MCH 34.8 (*)    Neutro Abs 13.9 (*)    Monocytes Absolute 1.4 (*)    All other components within normal limits  PROTIME-INR - Abnormal; Notable for the following components:  Prothrombin Time 16.3 (*)    All other components within normal limits  URINALYSIS, ROUTINE W REFLEX MICROSCOPIC - Abnormal; Notable for the following components:   Color, Urine STRAW (*)    All other components within normal limits  I-STAT CG4 LACTIC ACID, ED - Abnormal;  Notable for the following components:   Lactic Acid, Venous 2.76 (*)    All other components within normal limits  CULTURE, BLOOD (ROUTINE X 2)  CULTURE, BLOOD (ROUTINE X 2)  URINE CULTURE    EKG EKG Interpretation  Date/Time:  Saturday May 23 2017 03:16:10 EDT Ventricular Rate:  97 PR Interval:    QRS Duration: 197 QT Interval:  364 QTC Calculation: 420 R Axis:   64 Text Interpretation:  Atrial fibrillation Ventricular premature complex Right bundle branch block Artifact in lead(s) I II III aVR aVL aVF Confirmed by Tanna Furry [phone]) on 05/23/2017 3:27:00 AM   Radiology Dg Chest 2 View  Result Date: 05/23/2017 CLINICAL DATA:  Weakness and fever EXAM: CHEST - 2 VIEW COMPARISON:  03/06/2015 FINDINGS: Mild cardiomegaly. Left infrahilar suspected consolidation. No pleural effusion. No pneumothorax. IMPRESSION: Suspected left infrahilar consolidation, possible infiltrate. Short interval radiographic follow-up recommended to ensure clearing. Electronically Signed   By: Donavan Foil M.D.   On: 05/23/2017 02:50    Procedures Procedures (including critical care time)  Medications Ordered in ED Medications  sodium chloride 0.9 % bolus 1,000 mL (1,000 mLs Intravenous New Bag/Given 05/23/17 0301)    And  sodium chloride 0.9 % bolus 1,000 mL (1,000 mLs Intravenous New Bag/Given 05/23/17 0301)    And  sodium chloride 0.9 % bolus 500 mL (500 mLs Intravenous New Bag/Given 05/23/17 0304)  cefTRIAXone (ROCEPHIN) 2 g in sodium chloride 0.9 % 100 mL IVPB (has no administration in time range)  azithromycin (ZITHROMAX) 500 mg in sodium chloride 0.9 % 250 mL IVPB (has no administration in time range)  acetaminophen (TYLENOL) tablet 1,000 mg (1,000 mg Oral Given 05/23/17 0304)     Initial Impression / Assessment and Plan / ED Course  I have reviewed the triage vital signs and the nursing notes.  Pertinent labs & imaging results that were available during my care of the patient were reviewed by me and  considered in my medical decision making (see chart for details).   Febrile.  Not tachycardic tachypnea or hypotensive.  Elevated lactate.  Sepsis protocol initiated.  Await catheterized urine.  Likely UTI as source.  Plan fluids.  Will give slowly.  Tylenol.  Re-eval after collection of data.  EKG Interpretation  Date/Time:  Saturday May 23 2017 03:16:10 EDT Ventricular Rate:  97 PR Interval:    QRS Duration: 197 QT Interval:  364 QTC Calculation: 420 R Axis:   64 Text Interpretation:  Atrial fibrillation Ventricular premature complex Right bundle branch block Artifact in lead(s) I II III aVR aVL aVF Confirmed by Tanna Furry [phone]) on 05/23/2017 3:27:00 AM  Remains hemodynamically stable.  Has infiltrate in left lower lobe of lung.  I cannot auscultate this on my exam.  She remains weak.  Lactate is 2.76.  She is not hypoxemic.  Is given IV Rocephin and Zithromax.  Has allergy to Zosyn.  Has had cefazolin preoperatively in the past.  With her weakness, elevated lactate, age, and signs of localized bacterial infection we will discussed admission with the hospitalist.   CRITICAL CARE Performed by: Lolita Patella   Total critical care time: 30 minutes  This includes initiation of sepsis protocol, resuscitation  boluses for elevated lactate.  Critical care time was exclusive of separately billable procedures and treating other patients.  Critical care was necessary to treat or prevent imminent or life-threatening deterioration.  Critical care was time spent personally by me on the following activities: development of treatment plan with patient and/or surrogate as well as nursing, discussions with consultants, evaluation of patient's response to treatment, examination of patient, obtaining history from patient or surrogate, ordering and performing treatments and interventions, ordering and review of laboratory studies, ordering and review of radiographic studies, pulse oximetry and  re-evaluation of patient's condition.   Final Clinical Impressions(s) / ED Diagnoses   Final diagnoses:  Community acquired pneumonia of left lung, unspecified part of lung    ED Discharge Orders    None       Tanna Furry, MD 05/23/17 [id number]    Tanna Furry, MD 06/09/17 1547

## 2017-05-23 NOTE — Progress Notes (Signed)
    PROGRESS NOTE   Brief Summary:   84 y.o. with A-flutter, h/o breast cancer in remission, chronic ostio of left tibia on Doxy who suddenly became weak at her ALF and had an unusual episode of urinary incontinence and was sent to the ED. She as noted to have low grade fevers, elevated lactic acid and LLL infiltrate on CXR. UA was unrevealing. She was started on antibiotics for CAP.    Principal Problem:   Community acquired pneumonia of left lower lobe of lung - noted to have a cough on exam- cont Rocephin and Zithromax- continues to have low grade fevers  Active Problems: Generalized weakness - ambulate BID, PT eval    Atrial flutter   - Eliquis, Diltiazem, Metoprolol- rate controlled    Malignant neoplasm of upper-outer quadrant of left breast in female, estrogen receptor positive   - Arimidex    Chronic osteomyelitis of left tibia (HCC) - Doxycycline  Constipation - Linzess  Urinary retention - Ditropan  Hypothyroid - synthroid   Anxiety Buspar, Restoril  Debbe Odea, MD 05/23/2017 [facility] Pager: [phone]

## 2017-05-23 NOTE — ED Notes (Signed)
Bed: WA21 Expected date:  Expected time:  Means of arrival:  Comments: EMS  

## 2017-05-23 NOTE — Progress Notes (Signed)
CRITICAL VALUE ALERT  Critical Value:  Lactic Acid 2.5  Date & Time Notied:  05/23/17  Provider Notified: yes  Orders Received/Actions taken: awaiting orders

## 2017-05-23 NOTE — Progress Notes (Signed)
Pt's resting O2 saturation on 1 1/2 liters was 97%. She ambulated 30 ft on room air and sat was 89-93%. Placed pt on 1 liter after ambulation. Tolerated well. Eulas Post, RN

## 2017-05-24 ENCOUNTER — Encounter (HOSPITAL_COMMUNITY): Payer: Self-pay

## 2017-05-24 DIAGNOSIS — I4892 Unspecified atrial flutter: Secondary | ICD-10-CM

## 2017-05-24 DIAGNOSIS — C50412 Malignant neoplasm of upper-outer quadrant of left female breast: Secondary | ICD-10-CM | POA: Diagnosis not present

## 2017-05-24 DIAGNOSIS — Z170 Estrogen receptor positive status [ER+]: Secondary | ICD-10-CM | POA: Diagnosis not present

## 2017-05-24 DIAGNOSIS — J181 Lobar pneumonia, unspecified organism: Secondary | ICD-10-CM | POA: Diagnosis not present

## 2017-05-24 DIAGNOSIS — M86662 Other chronic osteomyelitis, left tibia and fibula: Secondary | ICD-10-CM | POA: Diagnosis not present

## 2017-05-24 LAB — LACTIC ACID, PLASMA: Lactic Acid, Venous: 1.3 mmol/L (ref 0.5–1.9)

## 2017-05-24 LAB — URINE CULTURE

## 2017-05-24 LAB — CBC
HCT: 37.7 % (ref 36.0–46.0)
Hemoglobin: 12.3 g/dL (ref 12.0–15.0)
MCH: 34.2 pg — ABNORMAL HIGH (ref 26.0–34.0)
MCHC: 32.6 g/dL (ref 30.0–36.0)
MCV: 104.7 fL — ABNORMAL HIGH (ref 78.0–100.0)
Platelets: 158 10*3/uL (ref 150–400)
RBC: 3.6 MIL/uL — ABNORMAL LOW (ref 3.87–5.11)
RDW: 13.2 % (ref 11.5–15.5)
WBC: 11 10*3/uL — ABNORMAL HIGH (ref 4.0–10.5)

## 2017-05-24 MED ORDER — CEFUROXIME AXETIL 500 MG PO TABS: 500.0000 mg | ORAL_TABLET | Freq: Two times a day (BID) | ORAL | 0 refills | Status: AC

## 2017-05-24 NOTE — Care Management CC44 (Signed)
Condition Code 44 Documentation Completed  Patient Details  Name: [name] MRN: [id number] Date of Birth: 04-Nov-1925   Condition Code 44 given:  Yes Patient signature on Condition Code 44 notice:  Yes Documentation of 2 MD's agreement:  Yes Code 44 added to claim:  Yes    Erenest Rasher, RN 05/24/2017, 3:09 PM

## 2017-05-24 NOTE — Evaluation (Signed)
Physical Therapy Evaluation Patient Details Name: [name] MRN: [id number] DOB: 03-20-1925 Today's Date: 05/24/2017   History of Present Illness   84 y.o. with A-flutter, h/o breast cancer in remission, chronic ostio of left tibia on Doxy who suddenly became weak at her ALF and had an unusual episode of urinary incontinence and was sent to the ED. She as noted to have low grade fevers, elevated lactic acid and LLL infiltrate on CXR  Clinical Impression  Pt ambulated 160' with RW, no loss of balance, SaO2 91-96% on room air. She is at her baseline of modified independence with mobility and is ready to DC home to her independent living facility. No further PT indicated. Will sign off.     Follow Up Recommendations No PT follow up    Equipment Recommendations  None recommended by PT    Recommendations for Other Services       Precautions / Restrictions Precautions Precautions: Fall Precaution Comments: pt denies falls in the past 1 year Restrictions Weight Bearing Restrictions: No      Mobility  Bed Mobility Overal bed mobility: Modified Independent             General bed mobility comments: HOB up  Transfers Overall transfer level: Modified independent Equipment used: Rolling walker (2 wheeled)                Ambulation/Gait Ambulation/Gait assistance: Modified independent (Device/Increase time) Ambulation Distance (Feet): 160 Feet Assistive device: Rolling walker (2 wheeled) Gait Pattern/deviations: Step-through pattern;Decreased stride length Gait velocity: decr   General Gait Details: steady, no LOB, SaO2 91-96% on room air  Stairs            Wheelchair Mobility    Modified Rankin (Stroke Patients Only)       Balance Overall balance assessment: Modified Independent                                           Pertinent Vitals/Pain Pain Assessment: No/denies pain    Home Living Family/patient expects to be discharged  to:: Private residence Living Arrangements: Spouse/significant other Available Help at Discharge: Available 24 hours/day;Family Type of Home: Independent living facility Home Access: Level entry     Home Layout: One level Home Equipment: [place] - 4 wheels;Shower seat;Grab bars - tub/shower      Prior Function Level of Independence: Independent with assistive device(s)         Comments: walks with rollator     Hand Dominance        Extremity/Trunk Assessment   Upper Extremity Assessment Upper Extremity Assessment: Overall WFL for tasks assessed    Lower Extremity Assessment Lower Extremity Assessment: Overall WFL for tasks assessed    Cervical / Trunk Assessment Cervical / Trunk Assessment: Normal  Communication   Communication: HOH  Cognition Arousal/Alertness: Awake/alert Behavior During Therapy: WFL for tasks assessed/performed Overall Cognitive Status: Within Functional Limits for tasks assessed                                        General Comments      Exercises     Assessment/Plan    PT Assessment Patent does not need any further PT services  PT Problem List         PT Treatment Interventions  PT Goals (Current goals can be found in the Care Plan section)  Acute Rehab PT Goals Patient Stated Goal: return to chair yoga classes at [facility] PT Goal Formulation: All assessment and education complete, DC therapy    Frequency     Barriers to discharge        Co-evaluation               AM-PAC PT "6 Clicks" Daily Activity  Outcome Measure Difficulty turning over in bed (including adjusting bedclothes, sheets and blankets)?: None Difficulty moving from lying on back to sitting on the side of the bed? : None Difficulty sitting down on and standing up from a chair with arms (e.g., wheelchair, bedside commode, etc,.)?: None Help needed moving to and from a bed to chair (including a wheelchair)?: None Help needed  walking in hospital room?: None Help needed climbing 3-5 steps with a railing? : A Lot 6 Click Score: 22    End of Session Equipment Utilized During Treatment: Gait belt Activity Tolerance: Patient tolerated treatment well Patient left: in chair;with call bell/phone within reach;with nursing/sitter in room Nurse Communication: Mobility status      Time: 5427-0623 PT Time Calculation (min) (ACUTE ONLY): 22 min   Charges:   PT Evaluation $PT Eval Low Complexity: 1 Low     PT G Codes:          Philomena Doheny 05/24/2017, 10:38 AM [phone]

## 2017-05-24 NOTE — Progress Notes (Signed)
Notified UR of potential Code 44. Jonnie Finner RN CCM Case Mgmt phone [phone]

## 2017-05-24 NOTE — Discharge Summary (Signed)
Physician Discharge Summary  [name]:[id number] DOB: [birth year] DOA: 05/23/2017  PCP: [name], MD  Admit date: 05/23/2017 Discharge date: 05/24/2017  Admitted From: ALF Disposition:  ALF  Recommendations for Outpatient Follow-up:  1. Repeat CXR in 2 wks please    Discharge Condition:  stable   CODE STATUS:  Full code   Consultations:  None     Discharge Diagnoses:  Principal Problem:   Community acquired pneumonia of left lower lobe of lung ([place]) Active Problems:   Atrial flutter ([place])   Malignant neoplasm of upper-outer quadrant of left breast in female, estrogen receptor positive     Chronic osteomyelitis of left tibia [facility])    Brief Summary: 84 y.o. with A-flutter, h/o breast cancer in remission, chronic ostio of left tibia on Doxy who suddenly became weak at her ALF and had an unusual episode of urinary incontinence and was sent to the ED. She as noted to have low grade fevers, elevated lactic acid and LLL infiltrate on CXR. UA was unrevealing. She was started on antibiotics for CAP.  Hospital Course:  Community acquired pneumonia of left lower lobe of lung Lactic acidosis, leukocytosis -  Coughing has improved but she still has LLL crackles on exam- she is not short of breath or hypoxic - low grade fevers have resolved - WBC count improved from 17 to 10 - Lactic acid improved from 2.76 to 1.3 - has been on Rocephin  - she already takes Doxy and no atypical coverage was added-  will change to Ceftin   Active Problems: Generalized weakness - ambulate BID, PT eval> no PT follow up recommended    Atrial flutter   - Eliquis, Diltiazem, Metoprolol- rate controlled    Malignant neoplasm of upper-outer quadrant of left breast in female, estrogen receptor positive   - Arimidex    Chronic osteomyelitis of left tibia (HCC) - Doxycycline  Constipation - Linzess  Urinary retention - Ditropan  Hypothyroid - synthroid   Anxiety Buspar,  Restoril     Discharge Exam: Vitals:   05/23/17 2056 05/24/17 0421  BP: 122/66 102/71  Pulse: 73 73  Resp: 20 18  Temp: 98.2 F (36.8 C) 98.6 F (37 C)  SpO2: 96% 96%   Vitals:   05/23/17 1440 05/23/17 2056 05/23/17 2056 05/24/17 0421  BP: 123/71  122/66 102/71  Pulse: (!) 52 83 73 73  Resp: (!) 26  20 18   Temp: 99.8 F (37.7 C)  98.2 F (36.8 C) 98.6 F (37 C)  TempSrc: Oral     SpO2: 95%  96% 96%  Weight:      Height:        General: Pt is alert, awake, not in acute distress Cardiovascular: RRR, S1/S2 +, no rubs, no gallops Respiratory: crackles in LLL Abdominal: Soft, NT, ND, bowel sounds + Extremities: no edema, no cyanosis   Discharge Instructions  Discharge Instructions    Diet - low sodium heart healthy   Complete by:  As directed    Increase activity slowly   Complete by:  As directed      Allergies as of 05/24/2017      Reactions   Zosyn [piperacillin Sod-tazobactam So] Hives, Rash, Other (See Comments)   Morbilliform eruption with itching   Atenolol Nausea And Vomiting   Clarithromycin Nausea And Vomiting   Codeine Sulfate Nausea Only   Levaquin [levofloxacin] Nausea Only   Macrodantin Other (See Comments)   UNSPECIFIED    Oxycodone-acetaminophen Nausea And Vomiting  Percocet [oxycodone-acetaminophen] Nausea And Vomiting      Medication List    TAKE these medications   acetaminophen 500 MG tablet Commonly known as:  TYLENOL Take 500 mg by mouth daily as needed for mild pain.   anastrozole 1 MG tablet Commonly known as:  ARIMIDEX Take 1 tablet (1 mg total) by mouth daily.   apixaban 5 MG Tabs tablet Commonly known as:  ELIQUIS Take 1 tablet (5 mg total) by mouth 2 (two) times daily.   atorvastatin 10 MG tablet Commonly known as:  LIPITOR Take 1 tablet (10 mg total) by mouth every evening.   B-complex with vitamin C tablet Take 1 tablet by mouth daily.   busPIRone 7.5 MG tablet Commonly known as:  BUSPAR Take 1 tablet (7.5 mg  total) by mouth 2 (two) times daily.   cefUROXime 500 MG tablet Commonly known as:  CEFTIN Take 1 tablet (500 mg total) by mouth 2 (two) times daily for 10 days.   cholecalciferol 1000 units tablet Commonly known as:  VITAMIN D Take 1,000 Units by mouth daily.   diltiazem 120 MG 24 hr capsule Commonly known as:  CARDIZEM CD TAKE 1 CAPSULE (120 MG TOTAL) BY MOUTH DAILY.   doxycycline 100 MG tablet Commonly known as:  VIBRA-TABS Take 1 tablet (100 mg total) by mouth 2 (two) times daily.   furosemide 40 MG tablet Commonly known as:  LASIX Take 1 tablet (40 mg total) by mouth as directed. 1 po on Mon-Wed-Fri in am   levothyroxine 25 MCG tablet Commonly known as:  SYNTHROID, LEVOTHROID TAKE 1 TABLET BY MOUTH ONCE DAILY FOR THYROID   linaclotide 290 MCG Caps capsule Commonly known as:  LINZESS Take 1 capsule (290 mcg total) by mouth daily.   metoprolol succinate 50 MG 24 hr tablet Commonly known as:  TOPROL-XL Take 1 tablet (50 mg total) by mouth 2 (two) times daily. Take with or immediately following a meal.   multivitamin with minerals tablet Take 1 tablet by mouth daily.   omeprazole 20 MG capsule Commonly known as:  PRILOSEC Take 20 mg by mouth daily as needed (acid reflux).   oxybutynin 5 MG tablet Commonly known as:  DITROPAN Take 1 tablet (5 mg total) by mouth 2 (two) times daily. Yearly physical due in June must see MD for refills   polyethylene glycol powder powder Commonly known as:  GLYCOLAX/MIRALAX Take 17-34 g by mouth 2 (two) times daily as needed for moderate constipation.   potassium chloride SA 20 MEQ tablet Commonly known as:  K-DUR,KLOR-CON Take 1 tablet (20 mEq total) by mouth daily.   temazepam 30 MG capsule Commonly known as:  RESTORIL Take 1 capsule (30 mg total) by mouth at bedtime.   vitamin C 500 MG tablet Commonly known as:  ASCORBIC ACID Take 500 mg by mouth daily.   ZINC-220 220 (50 Zn) MG capsule Generic drug:  zinc sulfate Take  220 mg by mouth daily.       Allergies  Allergen Reactions  . Zosyn [Piperacillin Sod-Tazobactam So] Hives, Rash and Other (See Comments)    Morbilliform eruption with itching  . Atenolol Nausea And Vomiting  . Clarithromycin Nausea And Vomiting  . Codeine Sulfate Nausea Only  . Levaquin [Levofloxacin] Nausea Only  . Macrodantin Other (See Comments)    UNSPECIFIED   . Oxycodone-Acetaminophen Nausea And Vomiting  . Percocet [Oxycodone-Acetaminophen] Nausea And Vomiting     Procedures/Studies:  Dg Chest 2 View  Result Date: 05/23/2017 CLINICAL DATA:  Weakness and fever EXAM: CHEST - 2 VIEW COMPARISON:  03/06/2015 FINDINGS: Mild cardiomegaly. Left infrahilar suspected consolidation. No pleural effusion. No pneumothorax. IMPRESSION: Suspected left infrahilar consolidation, possible infiltrate. Short interval radiographic follow-up recommended to ensure clearing. Electronically Signed   By: Donavan Foil M.D.   On: 05/23/2017 02:50     The results of significant diagnostics from this hospitalization (including imaging, microbiology, ancillary and laboratory) are listed below for reference.     Microbiology: Recent Results (from the past 240 hour(s))  Culture, blood (Routine x 2)     Status: None (Preliminary result)   Collection Time: 05/23/17  2:28 AM  Result Value Ref Range Status   Specimen Description   Final    BLOOD BLOOD RIGHT ARM Performed at [facility] [address], [place], [place] [address]    Special Requests   Final    BOTTLES DRAWN AEROBIC AND ANAEROBIC Blood Culture results may not be optimal due to an excessive volume of blood received in culture bottles Performed at [facility] [address], [place], [place] [address]    Culture   Final    NO GROWTH 1 DAY Performed at [facility], [place] [address] [place], [place] [address]    Report Status PENDING  Incomplete  Culture, blood (Routine x 2)     Status: None  (Preliminary result)   Collection Time: 05/23/17  2:28 AM  Result Value Ref Range Status   Specimen Description   Final    BLOOD BLOOD LEFT FOREARM Performed at [name] [address], [place], [place] [address]    Special Requests   Final    BOTTLES DRAWN AEROBIC AND ANAEROBIC Blood Culture adequate volume Performed at [facility] [address] [place], [place] [address]    Culture   Final    NO GROWTH 1 DAY Performed at [facility], [place] [address] [place], [place] [address]    Report Status PENDING  Incomplete  Urine culture     Status: Abnormal   Collection Time: 05/23/17  2:56 AM  Result Value Ref Range Status   Specimen Description   Final    URINE, CATHETERIZED Performed at [facility] [address], [place], [place] [address]    Special Requests   Final    NONE Performed at [facility], [place] [address] [place], [place] [address]    Culture MULTIPLE SPECIES PRESENT, SUGGEST RECOLLECTION (A)  Final   Report Status 05/24/2017 FINAL  Final  MRSA PCR Screening     Status: None   Collection Time: 05/23/17  6:00 AM  Result Value Ref Range Status   MRSA by PCR NEGATIVE NEGATIVE Final    Comment:        The GeneXpert MRSA Assay (FDA approved for NASAL specimens only), is one component of a comprehensive MRSA colonization surveillance program. It is not intended to diagnose MRSA infection nor to guide or monitor treatment for MRSA infections. Performed at [facility], [place] [address] [place], [place] [address]      Labs: BNP (last 3 results) No results for input(s): BNP in the last 8760 hours. Basic Metabolic Panel: Recent Labs  Lab 05/20/17 1517 05/23/17 0226  NA 137 136  K 3.6 4.3  CL 97 97*  CO2 31 26  GLUCOSE 105* 160*  BUN 14 16  CREATININE 0.93 0.93  CALCIUM 9.6 9.3   Liver  Function Tests: Recent Labs  Lab 05/23/17 0226  AST 45*   ALT 22  ALKPHOS 62  BILITOT 1.9*  PROT 7.8  ALBUMIN 4.1   No results for input(s): LIPASE, AMYLASE in the last 168 hours. No results for input(s): AMMONIA in the last 168 hours. CBC: Recent Labs  Lab 05/20/17 1517 05/23/17 0226 05/23/17 0945 05/24/17 0802  WBC 4.8 15.9* 17.2* 11.0*  NEUTROABS 2.7 13.9*  --   --   HGB 14.4 13.6 12.4 12.3  HCT 42.8 40.9 37.3 37.7  MCV 103.8* 104.6* 103.9* 104.7*  PLT 220.0 200 173 158   Cardiac Enzymes: No results for input(s): CKTOTAL, CKMB, CKMBINDEX, TROPONINI in the last 168 hours. BNP: Invalid input(s): POCBNP CBG: No results for input(s): GLUCAP in the last 168 hours. D-Dimer No results for input(s): DDIMER in the last 72 hours. Hgb A1c No results for input(s): HGBA1C in the last 72 hours. Lipid Profile No results for input(s): CHOL, HDL, LDLCALC, TRIG, CHOLHDL, LDLDIRECT in the last 72 hours. Thyroid function studies No results for input(s): TSH, T4TOTAL, T3FREE, THYROIDAB in the last 72 hours.  Invalid input(s): FREET3 Anemia work up No results for input(s): VITAMINB12, FOLATE, FERRITIN, TIBC, IRON, RETICCTPCT in the last 72 hours. Urinalysis    Component Value Date/Time   COLORURINE STRAW (A) 05/23/2017 0228   APPEARANCEUR CLEAR 05/23/2017 0228   LABSPEC 1.006 05/23/2017 0228   PHURINE 7.0 05/23/2017 0228   GLUCOSEU NEGATIVE 05/23/2017 0228   GLUCOSEU NEGATIVE 01/17/2015 1415   HGBUR NEGATIVE 05/23/2017 0228   BILIRUBINUR NEGATIVE 05/23/2017 0228   KETONESUR NEGATIVE 05/23/2017 0228   PROTEINUR NEGATIVE 05/23/2017 0228   UROBILINOGEN 0.2 01/17/2015 1415   NITRITE NEGATIVE 05/23/2017 0228   LEUKOCYTESUR NEGATIVE 05/23/2017 0228   Sepsis Labs Invalid input(s): PROCALCITONIN,  WBC,  LACTICIDVEN Microbiology Recent Results (from the past 240 hour(s))  Culture, blood (Routine x 2)     Status: None (Preliminary result)   Collection Time: 05/23/17  2:28 AM  Result Value Ref Range Status   Specimen Description   Final     BLOOD BLOOD RIGHT ARM Performed at [facility], [place] [address], [place], [place] [address]    Special Requests   Final    BOTTLES DRAWN AEROBIC AND ANAEROBIC Blood Culture results may not be optimal due to an excessive volume of blood received in culture bottles Performed at [facility] [address], [place], [place] [address]    Culture   Final    NO GROWTH 1 DAY Performed at [facility], [place] [address] [place], [place] [address]    Report Status PENDING  Incomplete  Culture, blood (Routine x 2)     Status: None (Preliminary result)   Collection Time: 05/23/17  2:28 AM  Result Value Ref Range Status   Specimen Description   Final    BLOOD BLOOD LEFT FOREARM Performed at [facility] [address] [place], [place] [address]    Special Requests   Final    BOTTLES DRAWN AEROBIC AND ANAEROBIC Blood Culture adequate volume Performed at [place] [address], [place], [place] [address]    Culture   Final    NO GROWTH 1 DAY Performed at [facility], [place] [address], [place], [place] [address]    Report Status PENDING  Incomplete  Urine culture     Status: Abnormal   Collection Time: 05/23/17  2:56 AM  Result Value Ref  Range Status   Specimen Description   Final    URINE, CATHETERIZED Performed at [facility] [address], [place], [place] [address]    Special Requests   Final    NONE Performed at [facility], [place] [address] [place], [place] [address]    Culture MULTIPLE SPECIES PRESENT, SUGGEST RECOLLECTION (A)  Final   Report Status 05/24/2017 FINAL  Final  MRSA PCR Screening     Status: None   Collection Time: 05/23/17  6:00 AM  Result Value Ref Range Status   MRSA by PCR NEGATIVE NEGATIVE Final    Comment:        The GeneXpert MRSA Assay (FDA approved for NASAL specimens only), is one component of a comprehensive MRSA  colonization surveillance program. It is not intended to diagnose MRSA infection nor to guide or monitor treatment for MRSA infections. Performed at [facility], [place] [address], [place], [place] [address]      Time coordinating discharge: 55 min  SIGNED:   Debbe Odea, MD  [facility] 05/24/2017, 1:27 PM Pager   If 7PM-7AM, please contact night-coverage [email or web address] Password [email or web address]

## 2017-05-24 NOTE — Care Management Note (Signed)
Case Management Note  Patient Details  Name: [name] MRN: [id number] Date of Birth: 05/07/1925  Subjective/Objective:      Pneumonia              Action/Plan: Pt lives at [place] apt with husband. She has a Rollator at [place] apt. Daughter will be in town to assist with care for few days. No NCM needs identified.   Expected Discharge Date:  05/24/17               Expected Discharge Plan:  Home/Self Care  In-House Referral:  NA  Discharge planning Services  CM Consult  Post Acute Care Choice:  NA Choice offered to:  NA  DME Arranged:  N/A DME Agency:  NA  HH Arranged:  NA HH Agency:  NA  Status of Service:  Completed, signed off  If discussed at [place] of Stay Meetings, dates discussed:    Additional Comments:  Erenest Rasher, RN 05/24/2017, 3:10 PM

## 2017-05-24 NOTE — Care Management Obs Status (Signed)
[facility] [place] NOTIFICATION   Patient Details  Name: [name] MRN: [id number] Date of Birth: Apr 19, 1925   Medicare Observation Status Notification Given:  Yes    Erenest Rasher, RN 05/24/2017, 3:09 PM

## 2017-05-25 ENCOUNTER — Telehealth: Payer: Self-pay | Admitting: *Deleted

## 2017-05-25 NOTE — Telephone Encounter (Signed)
Pt was on TCM list admitted 05/23/17 for Community acquired pneumonia of left lower lobe of lung. Pt D/C 05/24/17 back to ALF. Call to make f/u appt w/MD in 2 weeks had to leave msg../lmb

## 2017-05-26 ENCOUNTER — Telehealth: Payer: Self-pay | Admitting: Internal Medicine

## 2017-05-26 NOTE — Telephone Encounter (Signed)
Copied from [place] [id number]. Topic: Quick Communication - See Telephone Encounter >> May 26, 2017  1:36 PM Boyd Kerbs wrote: CRM for notification. See Telephone encounter for: 05/26/17.  Daughter, [name]  called saying [name] was in  [place] last Friday night with Pneumonia  -  they gave her Cefcriaxon 1 mg.IV given in hospital  - and then prescription for  Cefuroxime antibiotic for 10 days.  She is Allergic to zosyn.   But the pharmacy was concerned because antibiotics are similar.  She has been taking them since Saturday and ok so far, but was told can have reaction up to 7 days after taking them.  Asking if ok to continue

## 2017-05-27 NOTE — Telephone Encounter (Signed)
Ok to continue Cefuroxime if no reaction. Hope she is getting better! Thx

## 2017-05-27 NOTE — Telephone Encounter (Signed)
Notified daughter [name] w/MD response. Made 2 week f/u per ER to recheck cxr after completing antibiotics.Marland KitchenJohny Bell

## 2017-05-28 ENCOUNTER — Telehealth: Payer: Self-pay | Admitting: Internal Medicine

## 2017-05-28 LAB — CULTURE, BLOOD (ROUTINE X 2)
Culture: NO GROWTH
Culture: NO GROWTH
Special Requests: ADEQUATE

## 2017-05-28 NOTE — Telephone Encounter (Signed)
Copied from [place] [id number]. Topic: Quick Communication - Rx Refill/Question >> May 28, 2017 11:11 AM Boyd Kerbs wrote: Medication:  atorvastatin (LIPITOR) 10 MG tablet  Has the patient contacted their pharmacy? Yes.   (Agent: If no, request that the patient contact the pharmacy for the refill.) Preferred Pharmacy (with phone number or street name):   [facility] [place] [facility] [place] [address] [place] [address] Phone: [phone] Fax: [phone]   Agent: Please be advised that RX refills may take up to 3 business days. We ask that you follow-up with your pharmacy.

## 2017-05-29 MED ORDER — ATORVASTATIN CALCIUM 10 MG PO TABS: 10.0000 mg | ORAL_TABLET | Freq: Every evening | ORAL | 1 refills | Status: DC

## 2017-06-12 ENCOUNTER — Ambulatory Visit (INDEPENDENT_AMBULATORY_CARE_PROVIDER_SITE_OTHER)
Admission: RE | Admit: 2017-06-12 | Discharge: 2017-06-12 | Disposition: A | Discharge status: Home / Self Care | Payer: Medicare Other | Source: Ambulatory Visit | Attending: Internal Medicine | Admitting: Internal Medicine

## 2017-06-12 ENCOUNTER — Ambulatory Visit (INDEPENDENT_AMBULATORY_CARE_PROVIDER_SITE_OTHER): Payer: Medicare Other | Admitting: Internal Medicine

## 2017-06-12 ENCOUNTER — Encounter: Payer: Self-pay | Admitting: Internal Medicine

## 2017-06-12 DIAGNOSIS — J181 Lobar pneumonia, unspecified organism: Secondary | ICD-10-CM | POA: Diagnosis not present

## 2017-06-12 DIAGNOSIS — M545 Low back pain, unspecified: Secondary | ICD-10-CM

## 2017-06-12 DIAGNOSIS — J189 Pneumonia, unspecified organism: Secondary | ICD-10-CM | POA: Diagnosis not present

## 2017-06-12 DIAGNOSIS — Z7901 Long term (current) use of anticoagulants: Secondary | ICD-10-CM

## 2017-06-12 DIAGNOSIS — I4892 Unspecified atrial flutter: Secondary | ICD-10-CM

## 2017-06-12 NOTE — Assessment & Plan Note (Signed)
Chronic Tylenol prn 

## 2017-06-12 NOTE — Assessment & Plan Note (Signed)
Metoprolol, Diltiazem On Coumadin Recurrent

## 2017-06-12 NOTE — Progress Notes (Signed)
Subjective:  Patient ID: [name], female    DOB: 1925-04-30  Age: 84 y.o. MRN: [id number]  CC: No chief complaint on file.   HPI [name] presents for hosp F/u: adm for CAP and d/c'd on 05/24/17. Finished abx. Feeling better.  Per hx: "Community acquired pneumonia of left lower lobe of lung Lactic acidosis, leukocytosis -  Coughing has improved but she still has LLL crackles on exam- she is not short of breath or hypoxic - low grade fevers have resolved - WBC count improved from 17 to 10 - Lactic acid improved from 2.76 to 1.3 - has been on Rocephin  - she already takes Doxy and no atypical coverage was added-  will change to Ceftin   Active Problems: Generalized weakness - ambulate BID, PT eval> no PT follow up recommended  Atrial flutter  - Eliquis, Diltiazem, Metoprolol- rate controlled  Malignant neoplasm of upper-outer quadrant of left breast in female, estrogen receptor positive  - Arimidex  Chronic osteomyelitis of left tibia (HCC) - Doxycycline  Constipation - Linzess  Urinary retention - Ditropan  Hypothyroid - synthroid   Anxiety Buspar, Restoril"    Outpatient Medications Prior to Visit  Medication Sig Dispense Refill  . acetaminophen (TYLENOL) 500 MG tablet Take 500 mg by mouth daily as needed for mild pain.     [name] anastrozole (ARIMIDEX) 1 MG tablet Take 1 tablet (1 mg total) by mouth daily. 90 tablet 3  . apixaban (ELIQUIS) 5 MG TABS tablet Take 1 tablet (5 mg total) by mouth 2 (two) times daily. 180 tablet 2  . atorvastatin (LIPITOR) 10 MG tablet Take 1 tablet (10 mg total) by mouth every evening. 90 tablet 1  . B Complex-C (B-COMPLEX WITH VITAMIN C) tablet Take 1 tablet by mouth daily.    . busPIRone (BUSPAR) 7.5 MG tablet Take 1 tablet (7.5 mg total) by mouth 2 (two) times daily. 180 tablet 1  . Cholecalciferol (VITAMIN D3) 1000 UNITS tablet Take 1,000 Units by mouth daily.      [name] diltiazem (CARDIZEM CD) 120 MG 24 hr  capsule TAKE 1 CAPSULE (120 MG TOTAL) BY MOUTH DAILY. 90 capsule 1  . doxycycline (VIBRA-TABS) 100 MG tablet Take 1 tablet (100 mg total) by mouth 2 (two) times daily. 60 tablet 5  . furosemide (LASIX) 40 MG tablet Take 1 tablet (40 mg total) by mouth as directed. 1 po on Mon-Wed-Fri in am 90 tablet 1  . levothyroxine (SYNTHROID, LEVOTHROID) 25 MCG tablet TAKE 1 TABLET BY MOUTH ONCE DAILY FOR THYROID 90 tablet 1  . linaclotide (LINZESS) 290 MCG CAPS capsule Take 1 capsule (290 mcg total) by mouth daily. 90 capsule 3  . metoprolol succinate (TOPROL-XL) 50 MG 24 hr tablet Take 1 tablet (50 mg total) by mouth 2 (two) times daily. Take with or immediately following a meal. 180 tablet 3  . Multiple Vitamins-Minerals (MULTIVITAMIN WITH MINERALS) tablet Take 1 tablet by mouth daily.    [name] omeprazole (PRILOSEC) 20 MG capsule Take 20 mg by mouth daily as needed (acid reflux).     [name] oxybutynin (DITROPAN) 5 MG tablet Take 1 tablet (5 mg total) by mouth 2 (two) times daily. Yearly physical due in June must see MD for refills 60 tablet 5  . polyethylene glycol powder (GLYCOLAX/MIRALAX) powder Take 17-34 g by mouth 2 (two) times daily as needed for moderate constipation. 850 g 5  . potassium chloride SA (K-DUR,KLOR-CON) 20 MEQ tablet Take 1 tablet (20 mEq  total) by mouth daily. 90 tablet 1  . temazepam (RESTORIL) 30 MG capsule Take 1 capsule (30 mg total) by mouth at bedtime. 30 capsule 5  . vitamin C (ASCORBIC ACID) 500 MG tablet Take 500 mg by mouth daily.    [name] zinc sulfate (ZINC-220) 220 (50 Zn) MG capsule Take 220 mg by mouth daily.    [name] doxycycline (VIBRAMYCIN) 100 MG capsule Take 1 capsule by mouth 2 (two) times daily.  4   No facility-administered medications prior to visit.     ROS: Review of Systems  Constitutional: Positive for fatigue. Negative for activity change, appetite change, chills and unexpected weight change.  HENT: Negative for congestion, mouth sores and sinus pressure.   Eyes: Negative  for visual disturbance.  Respiratory: Positive for cough. Negative for chest tightness.   Gastrointestinal: Negative for abdominal pain and nausea.  Genitourinary: Negative for difficulty urinating, frequency and vaginal pain.  Musculoskeletal: Positive for arthralgias, back pain and gait problem.  Skin: Negative for pallor and rash.  Neurological: Negative for dizziness, tremors, weakness, numbness and headaches.  Psychiatric/Behavioral: Negative for confusion and sleep disturbance.    Objective:  BP 130/88 (BP Location: Left Arm, Patient Position: Sitting, Cuff Size: Normal)   Pulse 71   Ht 5\' 5"  (1.651 m)   Wt 178 lb (80.7 kg)   SpO2 95%   BMI 29.62 kg/m   BP Readings from Last 3 Encounters:  06/12/17 130/88  05/24/17 135/67  05/20/17 136/78    Wt Readings from Last 3 Encounters:  06/12/17 178 lb (80.7 kg)  05/23/17 177 lb 9.6 oz (80.6 kg)  05/20/17 177 lb (80.3 kg)    Physical Exam  Constitutional: She appears well-developed. No distress.  HENT:  Head: Normocephalic.  Right Ear: External ear normal.  Left Ear: External ear normal.  Nose: Nose normal.  Mouth/Throat: Oropharynx is clear and moist.  Eyes: Pupils are equal, round, and reactive to light. Conjunctivae are normal. Right eye exhibits no discharge. Left eye exhibits no discharge.  Neck: Normal range of motion. Neck supple. No JVD present. No tracheal deviation present. No thyromegaly present.  Cardiovascular: Normal rate, regular rhythm and normal heart sounds.  Pulmonary/Chest: No stridor. No respiratory distress. She has no wheezes.  Abdominal: Soft. Bowel sounds are normal. She exhibits no distension and no mass. There is no tenderness. There is no rebound and no guarding.  Musculoskeletal: She exhibits tenderness. She exhibits no edema.  Lymphadenopathy:    She has no cervical adenopathy.  Neurological: She displays normal reflexes. No cranial nerve deficit. She exhibits normal muscle tone. Coordination  normal.  Skin: No rash noted. No erythema.  Psychiatric: She has a normal mood and affect. Her behavior is normal. Judgment and thought content normal.   Walker  Lab Results  Component Value Date   WBC 11.0 (H) 05/24/2017   HGB 12.3 05/24/2017   HCT 37.7 05/24/2017   PLT 158 05/24/2017   GLUCOSE 160 (H) 05/23/2017   CHOL 116 06/25/2012   TRIG 82 09/05/2013   HDL 41.50 06/25/2012   LDLDIRECT 110.3 05/24/2008   LDLCALC 66 06/25/2012   ALT 22 05/23/2017   AST 45 (H) 05/23/2017   NA 136 05/23/2017   K 4.3 05/23/2017   CL 97 (L) 05/23/2017   CREATININE 0.93 05/23/2017   BUN 16 05/23/2017   CO2 26 05/23/2017   TSH 0.65 05/20/2017   INR 1.33 05/23/2017   HGBA1C 6.1 (H) 09/08/2013    Dg Chest 2 View  Result Date: 05/23/2017 CLINICAL DATA:  Weakness and fever EXAM: CHEST - 2 VIEW COMPARISON:  03/06/2015 FINDINGS: Mild cardiomegaly. Left infrahilar suspected consolidation. No pleural effusion. No pneumothorax. IMPRESSION: Suspected left infrahilar consolidation, possible infiltrate. Short interval radiographic follow-up recommended to ensure clearing. Electronically Signed   By: Donavan Foil M.D.   On: 05/23/2017 02:50    Assessment & Plan:   There are no diagnoses linked to this encounter.   No orders of the defined types were placed in this encounter.    Follow-up: No follow-ups on file.  Walker Kehr, MD

## 2017-06-12 NOTE — Assessment & Plan Note (Signed)
Eliquis cost was discussed

## 2017-06-12 NOTE — Assessment & Plan Note (Signed)
Better CXR

## 2017-06-30 ENCOUNTER — Telehealth: Payer: Self-pay | Admitting: Internal Medicine

## 2017-06-30 NOTE — Telephone Encounter (Signed)
Okay to pick up samples?

## 2017-06-30 NOTE — Telephone Encounter (Unsigned)
Copied from [place] [id number]. Topic: Quick Communication - See Telephone Encounter >> Jun 30, 2017 11:12 AM Neva Seat wrote: Pt is needing the samples linaclotide (LINZESS) 290 MCG CAPS capsule. -  Pt wanted to let Dr. Judeen Hammans assistant Inez Catalina know. Please call pt back to let her know when she can come by to pick them up.

## 2017-07-01 NOTE — Telephone Encounter (Signed)
Pt calling to check status on getting samples of the Linzess.

## 2017-07-02 NOTE — Telephone Encounter (Signed)
Ok Thx 

## 2017-07-02 NOTE — Telephone Encounter (Signed)
Pt notified samples are ready

## 2017-07-09 ENCOUNTER — Telehealth: Payer: Self-pay | Admitting: *Deleted

## 2017-07-09 MED ORDER — OXYBUTYNIN CHLORIDE 5 MG PO TABS: 5.0000 mg | ORAL_TABLET | Freq: Two times a day (BID) | ORAL | 5 refills | Status: DC

## 2017-07-09 NOTE — Telephone Encounter (Signed)
Copied from [place] [id number]. Topic: Quick Communication - Rx Refill/Question >> Jul 09, 2017 11:30 AM Carolyn Stare wrote: Medication   oxybutynin (DITROPAN) 5 MG tablet  Has the patient contacted their pharmacy yes    Preferred Pharmacy  [place]: Please be advised that RX refills may take up to 3 business days. We ask that you follow-up with your pharmacy.    Reviewed chart pt is up-to-date sent refills to requested pharmacy!Marland KitchenMarland KitchenJohny Chess

## 2017-07-27 NOTE — Progress Notes (Signed)
[name]  [phone] [phone]     ID: [name] DOB: 22-Jun-1925  MR#: [id number]  IRJ#:[id number]  Patient Care Team: [name], MD as PCP - General Angelena Form Annita Brod, MD as PCP - Cardiology (Cardiology) Rolm Bookbinder, MD as Consulting Physician (General Surgery) Lailoni Baquera, Virgie Dad, MD as Consulting Physician (Oncology) Burnell Blanks, MD as Consulting Physician (Cardiology) Jarome Matin, MD as Consulting Physician (Dermatology) Marlou Sa Tonna Corner, MD as Consulting Physician (Orthopedic Surgery) OTHER MD:  CHIEF COMPLAINT: Estrogen receptor positive invasive breast cancer  CURRENT TREATMENT: Anastrozole   BREAST CANCER HISTORY: From the original intake note:  [name] noted a change in her left breast and had bilateral screening mammography at [facility] 06[facility]07/2015, with tomosynthesis. Breast density was category A. There was a new oval mass in the left breast central to the nipple. Left breast ultrasound the same day confirmed a 3.4 cm oval mass in the left breast upper outer quadrant, correlating with the mammography. The left axilla was sonographically benign.  Biopsy of the left breast mass in question 06/11/2015 showed ([id number]) a papillary lesion, consistent with an intraductal papilloma, but due to fragmentation the edges of the lesion could not be assessed.  Accordingly the patient was referred to surgery, and after appropriate discussion underwent left lumpectomy 07/25/2015. The final pathology ([id number]) showed 2 foci of invasive ductal carcinoma, grade 2, measuring 3.0 cm, and grade 1, measuring 0.9 cm. There was also ductal carcinoma in situ. The invasive tumor was present at the superior margin, and ductal carcinoma in situ was focally less than 0.1 cm to the same margin. There was evidence of lymphovascular invasion. The prognostic panel showed estrogen receptor 100% positive, progesterone receptor  100% positive, both with strong staining intensity, for both lesions. HER-2 was also negative for both lesions, the signals ratio being 1.45-1.65, and the number per cell 2.10-2.80.  The patient's subsequent history is as detailed below  INTERVAL HISTORY: [name] returns today for follow-up and treatment of her estrogen receptor positive breast cancer. She is accompanied by her husband and grand-daughter.   The patient continues on anastrozole, which she tolerates well. She denies hot flashes or vaginal dryness. She is able to obtain it for a reasonable price.  REVIEW OF SYSTEMS: [name] is doing well. She will attend yoga classes along with a few other exercise classes. She also enjoys putting together puzzles. She denies bleeding issues (on apixaban).The patient denies unusual headaches, visual changes, nausea, vomiting, or dizziness. There has been no unusual cough, phlegm production, or pleurisy. This been no change in bowel or bladder habits. The patient denies unexplained fatigue or unexplained weight loss, bleeding, rash, or fever. A detailed review of systems was otherwise noncontributory.   PAST MEDICAL HISTORY: Past Medical History:  Diagnosis Date  . Anticoagulated on Coumadin   . Anxiety    denies  . Bilateral edema of lower extremity   . Cancer (Ages)    left breast  . Chronic constipation   . Diverticulosis of colon   . GERD (gastroesophageal reflux disease)   . H/O hiatal hernia   . History of cellulitis    LEFT LOWER LEG --  SEPT 2015  . History of colitis    DX  2003  WITH ISCHEMIC COLITIS  . History of diverticulitis of colon    perferated diverticulitis w/ abscess 08-25-2013 drain placed --  resolved without surgical intervention  . History of GI bleed    2011--- UPPER SECONARY  GASTRIC ULCER FROM NSAID USE  . History of Helicobacter pylori infection    2011  . History of ulcer of lower limb    2012   RIGHT LOWER EXTREMITIY--  STATSIS ULCER  . Hyperlipidemia   .  Hypertension   . Hypothyroidism   . Iron deficiency anemia 2011   elevated MCV  . LBP (low back pain)    severe lumbar spondylosis s/p L3/L4,L4/L5 fusion 12/10  . Lumbar spondylolysis   . OA (osteoarthritis)    "hands" (08/25/2013)  . Open wound of left lower leg   . Osteopenia   . Persistent atrial fibrillation ([place])    CARDIOLOGIST-   DR MCALANY  . RBBB    at fib  . Urinary frequency     PAST SURGICAL HISTORY: Past Surgical History:  Procedure Laterality Date  . APPLICATION OF A-CELL OF EXTREMITY Left 06/21/2013   Procedure: APPLICATION OF A-CELL OF EXTREMITY;  Surgeon: Theodoro Kos, DO;  Location: [place];  Service: Plastics;  Laterality: Left;  . APPLICATION OF A-CELL OF EXTREMITY Left 08/22/2013   Procedure: APPLICATION OF A-CELL/VAC TO LOWER LEFT LEG WOUND;  Surgeon: Theodoro Kos, DO;  Location: [place];  Service: Plastics;  Laterality: Left;  . APPLICATION OF A-CELL OF EXTREMITY Left 02/06/2014   Procedure: WITH PLACEMENT OF A -CELL ;  Surgeon: Theodoro Kos, DO;  Location: [facility];  Service: Plastics;  Laterality: Left;  . APPLICATION OF A-CELL OF EXTREMITY Left 08/09/2014   Procedure: PLACEMENT OF A CELL;  Surgeon: Theodoro Kos, DO;  Location: [place];  Service: Plastics;  Laterality: Left;  . APPLICATION OF WOUND VAC Left 06/16/2013   Procedure: APPLICATION OF WOUND VAC;  Surgeon: Meredith Pel, MD;  Location: [facility];  Service: Orthopedics;  Laterality: Left;  . APPLICATION OF WOUND VAC Left 06/21/2013   Procedure: APPLICATION OF WOUND VAC;  Surgeon: Theodoro Kos, DO;  Location: [place];  Service: Plastics;  Laterality: Left;  . BIOPSY BREAST  07/25/2015   LEFT RADIOACTIVE SEED GUIDED EXCISIONAL BREAST BIOPSY (Left) as a surgical intervention  . EXTERNAL FIXATION LEG Left 06/16/2013   Procedure: EXTERNAL FIXATION LEG;  Surgeon: Meredith Pel, MD;  Location: [facility];  Service: Orthopedics;  Laterality: Left;  . EXTERNAL FIXATION REMOVAL  Left 06/21/2013   Procedure: REMOVAL EXTERNAL FIXATION LEG;  Surgeon: Meredith Pel, MD;  Location: [place];  Service: Orthopedics;  Laterality: Left;  . EYE SURGERY Bilateral    cataract removal  . HARDWARE REMOVAL Left 05/23/2014   Procedure: HARDWARE REMOVAL;  Surgeon: Meredith Pel, MD;  Location: [place];  Service: Orthopedics;  Laterality: Left;  REMOVAL OF HARDWARE LEFT TIBIA  . I&D EXTREMITY Left 06/16/2013   Procedure: IRRIGATION AND DEBRIDEMENT EXTREMITY;  Surgeon: Meredith Pel, MD;  Location: [place];  Service: Orthopedics;  Laterality: Left;  . I&D EXTREMITY Left 02/06/2014   Procedure: IRRIGATION AND DEBRIDEMENT LEFT LEG WOUND ;  Surgeon: Theodoro Kos, DO;  Location: [place];  Service: Plastics;  Laterality: Left;  . I&D EXTREMITY Left 08/09/2014   Procedure: IRRIGATION AND DEBRIDEMENT  OF LEFT LOWER LEG;  Surgeon: Theodoro Kos, DO;  Location: [facility] [place];  Service: Plastics;  Laterality: Left;  . LUMBAR FUSION  01-02-2009   L3-L5  . RADIOACTIVE SEED GUIDED EXCISIONAL BREAST BIOPSY Left 07/25/2015   Procedure: LEFT RADIOACTIVE SEED GUIDED EXCISIONAL BREAST BIOPSY;  Surgeon: Rolm Bookbinder, MD;  Location: [facility];  Service: General;  Laterality: Left;  .  RE-EXCISION OF BREAST LUMPECTOMY Left 09/05/2015   Procedure: RE-EXCISION OF LEFT BREAST LUMPECTOMY;  Surgeon: Rolm Bookbinder, MD;  Location: [facility];  Service: General;  Laterality: Left;  . SHOULDER OPEN ROTATOR CUFF REPAIR Left 1997  . TIBIA IM NAIL INSERTION Left 06/21/2013   Procedure: INTRAMEDULLARY (IM) NAIL TIBIAL;  Surgeon: Meredith Pel, MD;  Location: [facility];  Service: Orthopedics;  Laterality: Left;  . TONSILLECTOMY  AS CHILD  . TOTAL HIP ARTHROPLASTY Right 06-07-2010  . TRANSTHORACIC ECHOCARDIOGRAM  09-11-2010   DR MCALHANY   LVSF  55-60%/  MILD MV CALCIFICATION WITH NO STENOSIS/  MILD MR & TR / MODERATE LAE/  MODERATE TO SEVERE RAE  . UPPER GI ENDOSCOPY  03-29-2010    FAMILY HISTORY Family  History  Problem Relation Age of Onset  . Cancer Father   . Hypertension Other   The patient's father died at the age of 66 from heart problems. The patient's mother died at the age of 40 with a ruptured aneurysm. The patient had no brothers, 1 sister. There is no history of breast or ovarian cancer in the family.   GYNECOLOGIC HISTORY:  No LMP recorded. Patient is postmenopausal. Menarche age 89, first live birth age 44. The patient is GX P2. She stopped having periods in her early 27s. She did not take hormone replacement. She used oral contraceptives remotely for approximately 2 years, with no complications.  SOCIAL HISTORY:  Margareta used to work in Herbal[employer] but is now retired. Her husband Ernie Hew was a Airline pilot. They currently live at [facility]. Daughter Jerene Dilling lives in [place], near [place]. With her husband Jenny Reichmann. Daughter Pam lives in [place]. The patient has 2 grandchildren and 4 great-grandchildren. She is a Psychologist, forensic.    ADVANCED DIRECTIVES: The patient has a living will in place and she has named her granddaughter Almetta Lovely as her healthcare part of attorney. Lattie Haw can be reached at [place]: Social History   Tobacco Use  . Smoking status: Never Smoker  . Smokeless tobacco: Never Used  Substance Use Topics  . Alcohol use: No    Comment: 1 glass occ wine 08/03/14- none in a year  . Drug use: No     Colonoscopy: Laurence Spates, remote  PAP:  Bone density:   Allergies  Allergen Reactions  . Zosyn [Piperacillin Sod-Tazobactam So] Hives, Rash and Other (See Comments)    Morbilliform eruption with itching  . Atenolol Nausea And Vomiting  . Clarithromycin Nausea And Vomiting  . Codeine Sulfate Nausea Only  . Levaquin [Levofloxacin] Nausea Only  . Macrodantin Other (See Comments)    UNSPECIFIED   . Oxycodone-Acetaminophen Nausea And Vomiting  . Percocet [Oxycodone-Acetaminophen] Nausea And Vomiting    Current  Outpatient Medications  Medication Sig Dispense Refill  . acetaminophen (TYLENOL) 500 MG tablet Take 500 mg by mouth daily as needed for mild pain.     [name] anastrozole (ARIMIDEX) 1 MG tablet Take 1 tablet (1 mg total) by mouth daily. 90 tablet 3  . apixaban (ELIQUIS) 5 MG TABS tablet Take 1 tablet (5 mg total) by mouth 2 (two) times daily. 180 tablet 2  . atorvastatin (LIPITOR) 10 MG tablet Take 1 tablet (10 mg total) by mouth every evening. 90 tablet 1  . B Complex-C (B-COMPLEX WITH VITAMIN C) tablet Take 1 tablet by mouth daily.    . busPIRone (BUSPAR) 7.5 MG tablet Take 1 tablet (7.5 mg total) by mouth 2 (two) times daily.  180 tablet 1  . Cholecalciferol (VITAMIN D3) 1000 UNITS tablet Take 1,000 Units by mouth daily.      [name] diltiazem (CARDIZEM CD) 120 MG 24 hr capsule TAKE 1 CAPSULE (120 MG TOTAL) BY MOUTH DAILY. 90 capsule 1  . doxycycline (VIBRA-TABS) 100 MG tablet Take 1 tablet (100 mg total) by mouth 2 (two) times daily. 60 tablet 5  . furosemide (LASIX) 40 MG tablet Take 1 tablet (40 mg total) by mouth as directed. 1 po on Mon-Wed-Fri in am 90 tablet 1  . levothyroxine (SYNTHROID, LEVOTHROID) 25 MCG tablet TAKE 1 TABLET BY MOUTH ONCE DAILY FOR THYROID 90 tablet 1  . linaclotide (LINZESS) 290 MCG CAPS capsule Take 1 capsule (290 mcg total) by mouth daily. 90 capsule 3  . metoprolol succinate (TOPROL-XL) 50 MG 24 hr tablet Take 1 tablet (50 mg total) by mouth 2 (two) times daily. Take with or immediately following a meal. 180 tablet 3  . Multiple Vitamins-Minerals (MULTIVITAMIN WITH MINERALS) tablet Take 1 tablet by mouth daily.    [name] omeprazole (PRILOSEC) 20 MG capsule Take 20 mg by mouth daily as needed (acid reflux).     [name] oxybutynin (DITROPAN) 5 MG tablet Take 1 tablet (5 mg total) by mouth 2 (two) times daily. 60 tablet 5  . polyethylene glycol powder (GLYCOLAX/MIRALAX) powder Take 17-34 g by mouth 2 (two) times daily as needed for moderate constipation. 850 g 5  . potassium chloride SA  (K-DUR,KLOR-CON) 20 MEQ tablet Take 1 tablet (20 mEq total) by mouth daily. 90 tablet 1  . temazepam (RESTORIL) 30 MG capsule Take 1 capsule (30 mg total) by mouth at bedtime. 30 capsule 5  . vitamin C (ASCORBIC ACID) 500 MG tablet Take 500 mg by mouth daily.    [name] zinc sulfate (ZINC-220) 220 (50 Zn) MG capsule Take 220 mg by mouth daily.     No current facility-administered medications for this visit.     OBJECTIVE: Elderly white woman using a chair-walker Vitals:   07/28/17 1426  BP: (!) 146/76  Pulse: 71  Resp: 18  Temp: 98.2 F (36.8 C)  SpO2: 98%     Body mass index is 29.99 kg/m.    ECOG FS:1 - Symptomatic but completely ambulatory  Sclerae unicteric, pupils round and equal Oropharynx clear and moist No cervical or supraclavicular adenopathy Lungs no rales or rhonchi Heart slow rate regular rhythm Abd soft, nontender, positive bowel sounds MSK mild kyphosis but no focal spinal tenderness, no upper extremity lymphedema Neuro: nonfocal, well oriented, appropriate affect Breasts: The right breast is unremarkable.  The left breast is status post lumpectomy.  There is no evidence of local recurrence.  Both axillae are benign.  LAB RESULTS:  CMP     Component Value Date/Time   NA 136 05/23/2017 0226   NA 137 07/28/2016 1334   K 4.3 05/23/2017 0226   K 3.8 07/28/2016 1334   CL 97 (L) 05/23/2017 0226   CO2 26 05/23/2017 0226   CO2 28 07/28/2016 1334   GLUCOSE 160 (H) 05/23/2017 0226   GLUCOSE 160 (H) 07/28/2016 1334   BUN 16 05/23/2017 0226   BUN 9.5 07/28/2016 1334   CREATININE 0.93 05/23/2017 0226   CREATININE 1.0 07/28/2016 1334   CALCIUM 9.3 05/23/2017 0226   CALCIUM 9.6 07/28/2016 1334   PROT 7.8 05/23/2017 0226   PROT 7.4 07/28/2016 1334   ALBUMIN 4.1 05/23/2017 0226   ALBUMIN 4.0 07/28/2016 1334   AST 45 (H) 05/23/2017 0226  AST 21 07/28/2016 1334   ALT 22 05/23/2017 0226   ALT 24 07/28/2016 1334   ALKPHOS 62 05/23/2017 0226   ALKPHOS 96 07/28/2016 1334    BILITOT 1.9 (H) 05/23/2017 0226   BILITOT 1.21 (H) 07/28/2016 1334   GFRNONAA 53 (L) 05/23/2017 0226   GFRAA >60 05/23/2017 0226    INo results found for: SPEP, UPEP  Lab Results  Component Value Date   WBC 4.8 07/28/2017   NEUTROABS 2.5 07/28/2017   HGB 12.8 07/28/2017   HCT 38.5 07/28/2017   MCV 104.0 (H) 07/28/2017   PLT 196 07/28/2017      Chemistry      Component Value Date/Time   NA 136 05/23/2017 0226   NA 137 07/28/2016 1334   K 4.3 05/23/2017 0226   K 3.8 07/28/2016 1334   CL 97 (L) 05/23/2017 0226   CO2 26 05/23/2017 0226   CO2 28 07/28/2016 1334   BUN 16 05/23/2017 0226   BUN 9.5 07/28/2016 1334   CREATININE 0.93 05/23/2017 0226   CREATININE 1.0 07/28/2016 1334      Component Value Date/Time   CALCIUM 9.3 05/23/2017 0226   CALCIUM 9.6 07/28/2016 1334   ALKPHOS 62 05/23/2017 0226   ALKPHOS 96 07/28/2016 1334   AST 45 (H) 05/23/2017 0226   AST 21 07/28/2016 1334   ALT 22 05/23/2017 0226   ALT 24 07/28/2016 1334   BILITOT 1.9 (H) 05/23/2017 0226   BILITOT 1.21 (H) 07/28/2016 1334       No results found for: LABCA2  No components found for: LABCA125  No results for input(s): INR in the last 168 hours.  Urinalysis    Component Value Date/Time   COLORURINE STRAW (A) 05/23/2017 0228   APPEARANCEUR CLEAR 05/23/2017 0228   LABSPEC 1.006 05/23/2017 0228   PHURINE 7.0 05/23/2017 0228   GLUCOSEU NEGATIVE 05/23/2017 0228   GLUCOSEU NEGATIVE 01/17/2015 1415   HGBUR NEGATIVE 05/23/2017 0228   BILIRUBINUR NEGATIVE 05/23/2017 0228   KETONESUR NEGATIVE 05/23/2017 0228   PROTEINUR NEGATIVE 05/23/2017 0228   UROBILINOGEN 0.2 01/17/2015 1415   NITRITE NEGATIVE 05/23/2017 0228   LEUKOCYTESUR NEGATIVE 05/23/2017 0228     STUDIES: Mammography overdue  ELIGIBLE FOR AVAILABLE RESEARCH PROTOCOL: no  ASSESSMENT: 84 y.o. [place] [address] woman status post left breast upper outer quadrant biopsy 06/11/2015 for a papillary lesion with unclear margins.  (1)  status post left lumpectomy 07/25/2015 for an mpT2 cN0, stage IIA  invasive ductal carcinoma,  grade I-II  estrogen and progesterone receptor strongly positive, HER-2 not amplified   (a) additional surgery 09/05/2015 found some residual ductal carcinoma in situ but the margins were clear.  (2) no sentinel lymph node sampling or adjuvant radiation planned  (3) adjuvant anastrozole started 08/10/2015  PLAN:  Keigan is now 2 years out from definitive surgery for breast cancer with no evidence of disease recurrence.  This is very favorable.  She is tolerating anastrozole well and the plan will be to continue that a total of 5 years.  She is behind on mammography and I have set her up for that to be done within the next 2 weeks.  I commended her for her constant use of the chair walker and suggested her husband, who refuses to do the same, may want to at least use a cane regularly.  They are both high fall risk.  She will return to see me in 1 year, after her 89th birthday later this year  She knows to call for  any issues that may develop before the next visit.  Nonna Renninger, Virgie Dad, MD  07/28/17 2:40 PM Medical Oncology and Hematology [facility] [address] [place], [place] [address] Tel. [phone]    Fax. [phone]  I, Margit Banda am acting as a scribe for Chauncey Cruel, MD.   I, Lurline Del MD, have reviewed the above documentation for accuracy and completeness, and I agree with the above.

## 2017-07-28 ENCOUNTER — Inpatient Hospital Stay: Payer: Medicare Other | Attending: Oncology | Admitting: Oncology

## 2017-07-28 ENCOUNTER — Inpatient Hospital Stay: Payer: Medicare Other

## 2017-07-28 VITALS — BP 146/76 | HR 71 | Temp 98.2°F | Resp 18 | Ht 65.0 in | Wt 180.2 lb

## 2017-07-28 DIAGNOSIS — I10 Essential (primary) hypertension: Secondary | ICD-10-CM | POA: Insufficient documentation

## 2017-07-28 DIAGNOSIS — C50412 Malignant neoplasm of upper-outer quadrant of left female breast: Secondary | ICD-10-CM | POA: Insufficient documentation

## 2017-07-28 DIAGNOSIS — E039 Hypothyroidism, unspecified: Secondary | ICD-10-CM | POA: Diagnosis not present

## 2017-07-28 DIAGNOSIS — M818 Other osteoporosis without current pathological fracture: Secondary | ICD-10-CM | POA: Diagnosis not present

## 2017-07-28 DIAGNOSIS — C50912 Malignant neoplasm of unspecified site of left female breast: Secondary | ICD-10-CM

## 2017-07-28 DIAGNOSIS — Z7901 Long term (current) use of anticoagulants: Secondary | ICD-10-CM | POA: Diagnosis not present

## 2017-07-28 DIAGNOSIS — Z79811 Long term (current) use of aromatase inhibitors: Secondary | ICD-10-CM | POA: Diagnosis not present

## 2017-07-28 DIAGNOSIS — Z79899 Other long term (current) drug therapy: Secondary | ICD-10-CM | POA: Diagnosis not present

## 2017-07-28 DIAGNOSIS — Z170 Estrogen receptor positive status [ER+]: Secondary | ICD-10-CM | POA: Diagnosis not present

## 2017-07-28 LAB — CBC WITH DIFFERENTIAL/PLATELET
Basophils Absolute: 0.1 10*3/uL (ref 0.0–0.1)
Basophils Relative: 2 %
Eosinophils Absolute: 0.1 10*3/uL (ref 0.0–0.5)
Eosinophils Relative: 3 %
HCT: 38.5 % (ref 34.8–46.6)
Hemoglobin: 12.8 g/dL (ref 11.6–15.9)
Lymphocytes Relative: 32 %
Lymphs Abs: 1.5 10*3/uL (ref 0.9–3.3)
MCH: 34.6 pg — ABNORMAL HIGH (ref 25.1–34.0)
MCHC: 33.2 g/dL (ref 31.5–36.0)
MCV: 104 fL — ABNORMAL HIGH (ref 79.5–101.0)
Monocytes Absolute: 0.5 10*3/uL (ref 0.1–0.9)
Monocytes Relative: 11 %
Neutro Abs: 2.5 10*3/uL (ref 1.5–6.5)
Neutrophils Relative %: 52 %
Platelets: 196 10*3/uL (ref 145–400)
RBC: 3.7 MIL/uL (ref 3.70–5.45)
RDW: 13.1 % (ref 11.2–14.5)
WBC: 4.8 10*3/uL (ref 3.9–10.3)

## 2017-07-28 LAB — COMPREHENSIVE METABOLIC PANEL
ALT: 20 U/L (ref 0–44)
AST: 22 U/L (ref 15–41)
Albumin: 4.2 g/dL (ref 3.5–5.0)
Alkaline Phosphatase: 78 U/L (ref 38–126)
Anion gap: 8 (ref 5–15)
BUN: 12 mg/dL (ref 8–23)
CO2: 31 mmol/L (ref 22–32)
Calcium: 9.8 mg/dL (ref 8.9–10.3)
Chloride: 96 mmol/L — ABNORMAL LOW (ref 98–111)
Creatinine, Ser: 1.02 mg/dL — ABNORMAL HIGH (ref 0.44–1.00)
GFR calc Af Amer: 55 mL/min — ABNORMAL LOW (ref >60)
GFR calc non Af Amer: 47 mL/min — ABNORMAL LOW (ref >60)
Glucose, Bld: 99 mg/dL (ref 70–99)
Potassium: 3.6 mmol/L (ref 3.5–5.1)
Sodium: 135 mmol/L (ref 135–145)
Total Bilirubin: 1 mg/dL (ref 0.3–1.2)
Total Protein: 7.3 g/dL (ref 6.5–8.1)

## 2017-07-28 MED ORDER — ANASTROZOLE 1 MG PO TABS: 1.0000 mg | ORAL_TABLET | Freq: Every day | ORAL | 3 refills | Status: DC

## 2017-07-30 ENCOUNTER — Telehealth: Payer: Self-pay | Admitting: Oncology

## 2017-07-30 NOTE — Telephone Encounter (Signed)
Called patient and left message regarding appt made per 7/9 los.  Left message and sent calendar.

## 2017-07-31 DIAGNOSIS — Z85828 Personal history of other malignant neoplasm of skin: Secondary | ICD-10-CM | POA: Diagnosis not present

## 2017-07-31 DIAGNOSIS — L812 Freckles: Secondary | ICD-10-CM | POA: Diagnosis not present

## 2017-07-31 DIAGNOSIS — Z85820 Personal history of malignant melanoma of skin: Secondary | ICD-10-CM | POA: Diagnosis not present

## 2017-07-31 DIAGNOSIS — D485 Neoplasm of uncertain behavior of skin: Secondary | ICD-10-CM | POA: Diagnosis not present

## 2017-07-31 DIAGNOSIS — L821 Other seborrheic keratosis: Secondary | ICD-10-CM | POA: Diagnosis not present

## 2017-07-31 DIAGNOSIS — D0461 Carcinoma in situ of skin of right upper limb, including shoulder: Secondary | ICD-10-CM | POA: Diagnosis not present

## 2017-07-31 DIAGNOSIS — L570 Actinic keratosis: Secondary | ICD-10-CM | POA: Diagnosis not present

## 2017-08-04 ENCOUNTER — Other Ambulatory Visit: Payer: Self-pay | Admitting: *Deleted

## 2017-08-04 ENCOUNTER — Telehealth: Payer: Self-pay | Admitting: Internal Medicine

## 2017-08-04 MED ORDER — LEVOTHYROXINE SODIUM 25 MCG PO TABS: ORAL_TABLET | ORAL | 1 refills | Status: DC

## 2017-08-04 NOTE — Telephone Encounter (Signed)
Rx refilled per protocol. Lab- 2 months ago

## 2017-08-04 NOTE — Telephone Encounter (Signed)
Copied from [place] [id number]. Topic: Quick Communication - See Telephone Encounter >> Aug 04, 2017  9:56 AM Mylinda Latina, NT wrote: CRM for notification. See Telephone encounter for: 08/04/17. [place] Pharmacist at [facility] is calling to request a medication refill for the patient. The medication is levothyroxine (SYNTHROID, LEVOTHROID) 25 MCG tablet patient takes the generic of this medication   [place], [place] - [place] [phone] (Phone) [phone] (Fax)

## 2017-08-06 ENCOUNTER — Telehealth: Payer: Self-pay | Admitting: Internal Medicine

## 2017-08-06 NOTE — Telephone Encounter (Signed)
Copied from [place] [id number]. Topic: Quick Communication - Rx Refill/Question >> Aug 06, 2017 12:04 PM Waldemar Dickens, [place] R wrote: Medication: doxycycline (VIBRA-TABS) 100 MG tablet  Has the patient contacted their pharmacy? Yes  Preferred Pharmacy (with phone number or street name): [place], [place] - [place] [phone] (Phone) [phone] (Fax)      Agent: Please be advised that RX refills may take up to 3 business days. We ask that you follow-up with your pharmacy.

## 2017-08-10 MED ORDER — DOXYCYCLINE HYCLATE 100 MG PO TABS: 100.0000 mg | ORAL_TABLET | Freq: Two times a day (BID) | ORAL | 11 refills | Status: DC

## 2017-08-10 NOTE — Telephone Encounter (Signed)
[place]otte calling from [place] is calling in reference to the medication / Doxycycline. Please advise.

## 2017-08-10 NOTE — Telephone Encounter (Signed)
Done. Thx.

## 2017-08-10 NOTE — Addendum Note (Signed)
Addended by: [name] on: 08/10/2017 01:15 PM   Modules accepted: Orders

## 2017-08-11 ENCOUNTER — Telehealth: Payer: Self-pay | Admitting: Internal Medicine

## 2017-08-11 NOTE — Telephone Encounter (Signed)
Copied from [name] [id number]. Topic: Quick Communication - See Telephone Encounter >> Aug 11, 2017 11:45 AM Bea Graff, NT wrote: CRM for notification. See Telephone encounter for: 08/11/17. Pt would like to know if Dr. Alain Marion has any samples of linaclotide Rolan Lipa). Please advise.

## 2017-08-11 NOTE — Telephone Encounter (Signed)
Samples up front for pt to pick up, pt notified

## 2017-08-12 ENCOUNTER — Encounter: Payer: Self-pay | Admitting: Oncology

## 2017-08-12 DIAGNOSIS — Z86000 Personal history of in-situ neoplasm of breast: Secondary | ICD-10-CM | POA: Diagnosis not present

## 2017-08-13 ENCOUNTER — Telehealth: Payer: Self-pay | Admitting: *Deleted

## 2017-08-13 MED ORDER — BUSPIRONE HCL 7.5 MG PO TABS: 7.5000 mg | ORAL_TABLET | Freq: Two times a day (BID) | ORAL | 1 refills | Status: DC

## 2017-08-13 NOTE — Telephone Encounter (Signed)
Copied from [place]. Topic: Quick Communication - Rx Refill/Question >> Aug 10, 2017 12:08 PM Wynetta Emery, [place] C wrote: Medication: busPIRone (BUSPAR) 7.5 MG tablet   Has the patient contacted their pharmacy? Yes  (Agent: If no, request that the patient contact the pharmacy for the refill.) (Agent: If yes, when and what did the pharmacy advise?)  Preferred Pharmacy (with phone number or street name): [place], [place] - [place] [phone] (Phone) [phone] (Fax)    Reviewed chart pt is up-to-date sent refills to [name] pharmacy.Marland KitchenJohny Chess   Agent: Please be advised that RX refills may take up to 3 business days. We ask that you follow-up with your pharmacy. >> Aug 13, 2017  8:11 AM Carolyn Stare wrote:  [facility] is following up on there req for buspar  >> Aug 13, 2017  8:16 AM [place], Isidoro Donning wrote: Looks like this was never routed to [place].. Please advise.  (It states that a telephone note was created but I do not see one in her chart)

## 2017-08-25 ENCOUNTER — Ambulatory Visit: Payer: Medicare Other | Admitting: Internal Medicine

## 2017-09-01 ENCOUNTER — Ambulatory Visit (INDEPENDENT_AMBULATORY_CARE_PROVIDER_SITE_OTHER): Payer: Medicare Other | Admitting: Internal Medicine

## 2017-09-01 ENCOUNTER — Encounter: Payer: Self-pay | Admitting: Internal Medicine

## 2017-09-01 DIAGNOSIS — M818 Other osteoporosis without current pathological fracture: Secondary | ICD-10-CM | POA: Diagnosis not present

## 2017-09-01 DIAGNOSIS — K5901 Slow transit constipation: Secondary | ICD-10-CM

## 2017-09-01 DIAGNOSIS — D508 Other iron deficiency anemias: Secondary | ICD-10-CM | POA: Diagnosis not present

## 2017-09-01 DIAGNOSIS — I4892 Unspecified atrial flutter: Secondary | ICD-10-CM | POA: Diagnosis not present

## 2017-09-01 MED ORDER — DENOSUMAB 60 MG/ML ~~LOC~~ SOSY
60.0000 mg | PREFILLED_SYRINGE | Freq: Once | SUBCUTANEOUS | Status: AC
  Administered 2017-09-01: 60 mg via SUBCUTANEOUS

## 2017-09-01 NOTE — Assessment & Plan Note (Signed)
Metoprolol, Diltiazem  Eliquis

## 2017-09-01 NOTE — Addendum Note (Signed)
Addended by: Karren Cobble on: 09/01/2017 02:02 PM   Modules accepted: Orders

## 2017-09-01 NOTE — Progress Notes (Signed)
Subjective:  Patient ID: [name], female    DOB: 1925-09-21  Age: 84 y.o. MRN: [id number]  CC: No chief complaint on file.   HPI [name] presents for LBP, dyslipidemia, hypothyroidism f/u  Outpatient Medications Prior to Visit  Medication Sig Dispense Refill  . acetaminophen (TYLENOL) 500 MG tablet Take 500 mg by mouth daily as needed for mild pain.     [name] anastrozole (ARIMIDEX) 1 MG tablet Take 1 tablet (1 mg total) by mouth daily. 90 tablet 3  . apixaban (ELIQUIS) 5 MG TABS tablet Take 1 tablet (5 mg total) by mouth 2 (two) times daily. 180 tablet 2  . atorvastatin (LIPITOR) 10 MG tablet Take 1 tablet (10 mg total) by mouth every evening. 90 tablet 1  . B Complex-C (B-COMPLEX WITH VITAMIN C) tablet Take 1 tablet by mouth daily.    . busPIRone (BUSPAR) 7.5 MG tablet Take 1 tablet (7.5 mg total) by mouth 2 (two) times daily. 180 tablet 1  . Cholecalciferol (VITAMIN D3) 1000 UNITS tablet Take 1,000 Units by mouth daily.      [name] diltiazem (CARDIZEM CD) 120 MG 24 hr capsule TAKE 1 CAPSULE (120 MG TOTAL) BY MOUTH DAILY. 90 capsule 1  . doxycycline (VIBRA-TABS) 100 MG tablet Take 1 tablet (100 mg total) by mouth 2 (two) times daily. 60 tablet 11  . furosemide (LASIX) 40 MG tablet Take 1 tablet (40 mg total) by mouth as directed. 1 po on Mon-Wed-Fri in am 90 tablet 1  . levothyroxine (SYNTHROID, LEVOTHROID) 25 MCG tablet TAKE 1 TABLET BY MOUTH ONCE DAILY FOR THYROID 90 tablet 1  . linaclotide (LINZESS) 290 MCG CAPS capsule Take 1 capsule (290 mcg total) by mouth daily. 90 capsule 3  . metoprolol succinate (TOPROL-XL) 50 MG 24 hr tablet Take 1 tablet (50 mg total) by mouth 2 (two) times daily. Take with or immediately following a meal. 180 tablet 3  . Multiple Vitamins-Minerals (MULTIVITAMIN WITH MINERALS) tablet Take 1 tablet by mouth daily.    [name] omeprazole (PRILOSEC) 20 MG capsule Take 20 mg by mouth daily as needed (acid reflux).     [name] oxybutynin (DITROPAN) 5 MG tablet Take 1  tablet (5 mg total) by mouth 2 (two) times daily. 60 tablet 5  . polyethylene glycol powder (GLYCOLAX/MIRALAX) powder Take 17-34 g by mouth 2 (two) times daily as needed for moderate constipation. 850 g 5  . potassium chloride SA (K-DUR,KLOR-CON) 20 MEQ tablet Take 1 tablet (20 mEq total) by mouth daily. 90 tablet 1  . temazepam (RESTORIL) 30 MG capsule Take 1 capsule (30 mg total) by mouth at bedtime. 30 capsule 5  . vitamin C (ASCORBIC ACID) 500 MG tablet Take 500 mg by mouth daily.    [name] zinc sulfate (ZINC-220) 220 (50 Zn) MG capsule Take 220 mg by mouth daily.     No facility-administered medications prior to visit.     ROS: Review of Systems  Constitutional: Negative for activity change, appetite change, chills, fatigue and unexpected weight change.  HENT: Negative for congestion, mouth sores and sinus pressure.   Eyes: Negative for visual disturbance.  Respiratory: Negative for cough and chest tightness.   Gastrointestinal: Negative for abdominal pain and nausea.  Genitourinary: Negative for difficulty urinating, frequency and vaginal pain.  Musculoskeletal: Positive for arthralgias, back pain and gait problem.  Skin: Negative for pallor and rash.  Neurological: Negative for dizziness, tremors, weakness, numbness and headaches.  Psychiatric/Behavioral: Negative for confusion and sleep disturbance.  Objective:  BP 136/82 (BP Location: Left Arm, Patient Position: Sitting, Cuff Size: Large)   Pulse 72   Temp 98.6 F (37 C) (Oral)   Ht 5\' 5"  (1.651 m)   Wt 178 lb (80.7 kg)   SpO2 95%   BMI 29.62 kg/m   BP Readings from Last 3 Encounters:  09/01/17 136/82  07/28/17 (!) 146/76  06/12/17 130/88    Wt Readings from Last 3 Encounters:  09/01/17 178 lb (80.7 kg)  07/28/17 180 lb 3.2 oz (81.7 kg)  06/12/17 178 lb (80.7 kg)    Physical Exam  Constitutional: She appears well-developed. No distress.  HENT:  Head: Normocephalic.  Right Ear: External ear normal.  Left Ear:  External ear normal.  Nose: Nose normal.  Mouth/Throat: Oropharynx is clear and moist.  Eyes: Pupils are equal, round, and reactive to light. Conjunctivae are normal. Right eye exhibits no discharge. Left eye exhibits no discharge.  Neck: Normal range of motion. Neck supple. No JVD present. No tracheal deviation present. No thyromegaly present.  Cardiovascular: Normal rate, regular rhythm and normal heart sounds.  Pulmonary/Chest: No stridor. No respiratory distress. She has no wheezes.  Abdominal: Soft. Bowel sounds are normal. She exhibits no distension and no mass. There is no tenderness. There is no rebound and no guarding.  Musculoskeletal: She exhibits tenderness. She exhibits no edema.  Lymphadenopathy:    She has no cervical adenopathy.  Neurological: She displays abnormal reflex. No cranial nerve deficit. She exhibits normal muscle tone. Coordination abnormal.  Skin: No rash noted. No erythema.  Psychiatric: She has a normal mood and affect. Her behavior is normal. Judgment and thought content normal.    Lab Results  Component Value Date   WBC 4.8 07/28/2017   HGB 12.8 07/28/2017   HCT 38.5 07/28/2017   PLT 196 07/28/2017   GLUCOSE 99 07/28/2017   CHOL 116 06/25/2012   TRIG 82 09/05/2013   HDL 41.50 06/25/2012   LDLDIRECT 110.3 05/24/2008   LDLCALC 66 06/25/2012   ALT 20 07/28/2017   AST 22 07/28/2017   NA 135 07/28/2017   K 3.6 07/28/2017   CL 96 (L) 07/28/2017   CREATININE 1.02 (H) 07/28/2017   BUN 12 07/28/2017   CO2 31 07/28/2017   TSH 0.65 05/20/2017   INR 1.33 05/23/2017   HGBA1C 6.1 (H) 09/08/2013    Dg Chest 2 View  Result Date: 06/13/2017 CLINICAL DATA:  Followup left lung pneumonia. EXAM: CHEST - 2 VIEW COMPARISON:  05/23/2017 FINDINGS: Mild cardiac enlargement. Aortic atherosclerosis. No pleural effusions or edema. No airspace opacities. Spondylosis noted throughout the thoracic spine. IMPRESSION: Resolution of previous left lower lobe pneumonia. Aortic  Atherosclerosis (ICD10-I70.0). Electronically Signed   By: Kerby Moors M.D.   On: 06/13/2017 11:51    Assessment & Plan:   There are no diagnoses linked to this encounter.   No orders of the defined types were placed in this encounter.    Follow-up: No follow-ups on file.  Walker Kehr, MD

## 2017-09-01 NOTE — Assessment & Plan Note (Signed)
F/u w/Dr Magrinat 

## 2017-09-01 NOTE — Assessment & Plan Note (Signed)
Prolia

## 2017-09-01 NOTE — Assessment & Plan Note (Signed)
Linzess Miralax 

## 2017-10-12 ENCOUNTER — Telehealth: Payer: Self-pay | Admitting: Internal Medicine

## 2017-10-12 MED ORDER — DILTIAZEM HCL ER COATED BEADS 120 MG PO CP24: ORAL_CAPSULE | ORAL | 0 refills | Status: DC

## 2017-10-12 NOTE — Telephone Encounter (Signed)
Copied from [place] [id number]. Topic: Quick Communication - Rx Refill/Question >> Oct 12, 2017 10:44 AM Oliver Pila B wrote: Medication: diltiazem (CARDIZEM CD) 120 MG 24 hr capsule [[id number]]   Has the patient contacted their pharmacy? Yes.   (Agent: If no, request that the patient contact the pharmacy for the refill.) (Agent: If yes, when and what did the pharmacy advise?)  Preferred Pharmacy (with phone number or street name): [facility]  Agent: Please be advised that RX refills may take up to 3 business days. We ask that you follow-up with your pharmacy.

## 2017-10-12 NOTE — Telephone Encounter (Signed)
diltiazem refill Last Refill:04/14/17 # 90 1 RF Last OV: 09/01/17 PCP: Dr Alain Marion Pharmacy:[facility]

## 2017-10-27 ENCOUNTER — Ambulatory Visit: Payer: Self-pay

## 2017-10-27 ENCOUNTER — Other Ambulatory Visit: Payer: Self-pay | Admitting: Internal Medicine

## 2017-10-27 DIAGNOSIS — Z961 Presence of intraocular lens: Secondary | ICD-10-CM | POA: Diagnosis not present

## 2017-10-27 MED ORDER — METOPROLOL SUCCINATE ER 50 MG PO TB24: 50.0000 mg | ORAL_TABLET | Freq: Two times a day (BID) | ORAL | 1 refills | Status: DC

## 2017-10-27 NOTE — Telephone Encounter (Signed)
FYI

## 2017-10-27 NOTE — Telephone Encounter (Signed)
Copied from [place] [id number]. Topic: Quick Communication - Rx Refill/Question >> Oct 27, 2017 10:01 AM Yvette Rack wrote: Medication: metoprolol succinate (TOPROL-XL) 50 MG 24 hr tablet  Has the patient contacted their pharmacy? yes   Preferred Pharmacy (with phone number or street name): [place], [place] - [place] [phone] (Phone) [phone] (Fax)  Agent: Please be advised that RX refills may take up to 3 business days. We ask that you follow-up with your pharmacy.

## 2017-10-27 NOTE — Telephone Encounter (Signed)
Returned patient call who had question about her linzess. She stated that Dr Alain Marion gave her samples and told her she would have to take more to make her dose. Her dose is 230mcg. Her sample is 66mcg. She said she needs to take 4. 4x 72 = 288 was verified . Pt states that she has the proper dose, but wants to take the samples first. She was encouraged to check expiration dates, use care when taking sample medications . Pt medication list reviewed to assure her dose prescribed was Linzess 210mcg.  Reason for Disposition . Caller has medication question only, adult not sick, and triager answers question  Protocols used: MEDICATION QUESTION CALL-A-AH

## 2017-10-29 ENCOUNTER — Ambulatory Visit (INDEPENDENT_AMBULATORY_CARE_PROVIDER_SITE_OTHER): Payer: Medicare Other | Admitting: Cardiovascular Disease

## 2017-10-29 ENCOUNTER — Encounter: Payer: Self-pay | Admitting: Cardiovascular Disease

## 2017-10-29 VITALS — BP 148/80 | HR 74 | Ht 65.0 in | Wt 176.4 lb

## 2017-10-29 DIAGNOSIS — I4819 Other persistent atrial fibrillation: Secondary | ICD-10-CM | POA: Diagnosis not present

## 2017-10-29 DIAGNOSIS — I5032 Chronic diastolic (congestive) heart failure: Secondary | ICD-10-CM | POA: Diagnosis not present

## 2017-10-29 NOTE — Patient Instructions (Signed)
Medication Instructions:  Your physician recommends that you continue on your current medications as directed. Please refer to the Current Medication list given to you today.  If you need a refill on your cardiac medications before your next appointment, please call your pharmacy.   Lab work: none If you have labs (blood work) drawn today and your tests are completely normal, you will receive your results only by: . MyChart Message (if you have MyChart) OR . A paper copy in the mail If you have any lab test that is abnormal or we need to change your treatment, we will call you to review the results.  Testing/Procedures: none  Follow-Up: At [facility], you and your health needs are our priority.  As part of our continuing mission to provide you with exceptional heart care, we have created designated Provider Care Teams.  These Care Teams include your primary Cardiologist (physician) and Advanced Practice Providers (APPs -  Physician Assistants and Nurse Practitioners) who all work together to provide you with the care you need, when you need it. You will need a follow up appointment in 6 months.  Please call our office 2 months in advance to schedule this appointment.  You may see Christopher McAlhany, MD or one of the following Advanced Practice Providers on your designated Care Team:   Brittainy Simmons, PA-C Dayna Dunn, PA-C . Michele Lenze, PA-C  Any Other Special Instructions Will Be Listed Below (If Applicable).    

## 2017-10-29 NOTE — Progress Notes (Signed)
Chief Complaint  Patient presents with  . Follow-up    atrial fibrillation    History of Present Illness: 84 yo female with history of HTN, chronic low back pain, iron deficiency anemia and persistent atrial fibrillation/flutter who is here today for follow up. She is on Eliquis. In 2011, while on coumadin, she had dark stools with anemia. Coumadin was held and upper endoscopy showed several small clean based gastric ulcers. She has had no bleeding issues on Eliquis. She has chronic lower ext edema controlled with Lasix. She has left breast cancer and has had lumpectomy in July 2017. Echo April 2018 with normal LV systolic function, OZDG=64%. There was trivial mitral regurgitation.   She is here today for follow up. The patient denies any chest pain, dyspnea, palpitations, lower extremity edema, orthopnea, PND, dizziness, near syncope or syncope.    Primary Care Physician: [name], MD   Past Medical History:  Diagnosis Date  . Anticoagulated on Coumadin   . Anxiety    denies  . Bilateral edema of lower extremity   . Cancer ([place])    left breast  . Chronic constipation   . Diverticulosis of colon   . GERD (gastroesophageal reflux disease)   . H/O hiatal hernia   . History of cellulitis    LEFT LOWER LEG --  SEPT 2015  . History of colitis    DX  2003  WITH ISCHEMIC COLITIS  . History of diverticulitis of colon    perferated diverticulitis w/ abscess 08-25-2013 drain placed --  resolved without surgical intervention  . History of GI bleed    2011--- UPPER SECONARY GASTRIC ULCER FROM NSAID USE  . History of Helicobacter pylori infection    2011  . History of ulcer of lower limb    2012   RIGHT LOWER EXTREMITIY--  STATSIS ULCER  . Hyperlipidemia   . Hypertension   . Hypothyroidism   . Iron deficiency anemia 2011   elevated MCV  . LBP (low back pain)    severe lumbar spondylosis s/p L3/L4,L4/L5 fusion 12/10  . Lumbar spondylolysis   . OA (osteoarthritis)    "hands" (08/25/2013)  . Open wound of left lower leg   . Osteopenia   . Persistent atrial fibrillation    CARDIOLOGIST-   DR Burman Foster  . RBBB    at fib  . Urinary frequency     Past Surgical History:  Procedure Laterality Date  . APPLICATION OF A-CELL OF EXTREMITY Left 06/21/2013   Procedure: APPLICATION OF A-CELL OF EXTREMITY;  Surgeon: Theodoro Kos, DO;  Location: [facility];  Service: Plastics;  Laterality: Left;  . APPLICATION OF A-CELL OF EXTREMITY Left 08/22/2013   Procedure: APPLICATION OF A-CELL/VAC TO LOWER LEFT LEG WOUND;  Surgeon: Theodoro Kos, DO;  Location: [facility];  Service: Plastics;  Laterality: Left;  . APPLICATION OF A-CELL OF EXTREMITY Left 02/06/2014   Procedure: WITH PLACEMENT OF A -CELL ;  Surgeon: Theodoro Kos, DO;  Location: [facility];  Service: Plastics;  Laterality: Left;  . APPLICATION OF A-CELL OF EXTREMITY Left 08/09/2014   Procedure: PLACEMENT OF A CELL;  Surgeon: Theodoro Kos, DO;  Location: [facility];  Service: Plastics;  Laterality: Left;  . APPLICATION OF WOUND VAC Left 06/16/2013   Procedure: APPLICATION OF WOUND VAC;  Surgeon: Meredith Pel, MD;  Location: [facility];  Service: Orthopedics;  Laterality: Left;  . APPLICATION OF WOUND VAC Left 06/21/2013   Procedure: APPLICATION OF WOUND  VAC;  Surgeon: Theodoro Kos, DO;  Location: [name];  Service: Plastics;  Laterality: Left;  . BIOPSY BREAST  07/25/2015   LEFT RADIOACTIVE SEED GUIDED EXCISIONAL BREAST BIOPSY (Left) as a surgical intervention  . EXTERNAL FIXATION LEG Left 06/16/2013   Procedure: EXTERNAL FIXATION LEG;  Surgeon: Meredith Pel, MD;  Location: [facility];  Service: Orthopedics;  Laterality: Left;  . EXTERNAL FIXATION REMOVAL Left 06/21/2013   Procedure: REMOVAL EXTERNAL FIXATION LEG;  Surgeon: Meredith Pel, MD;  Location: [facility];  Service: Orthopedics;  Laterality: Left;  . EYE SURGERY Bilateral    cataract removal  . HARDWARE REMOVAL Left 05/23/2014   Procedure:  HARDWARE REMOVAL;  Surgeon: Meredith Pel, MD;  Location: [facility];  Service: Orthopedics;  Laterality: Left;  REMOVAL OF HARDWARE LEFT TIBIA  . I&D EXTREMITY Left 06/16/2013   Procedure: IRRIGATION AND DEBRIDEMENT EXTREMITY;  Surgeon: Meredith Pel, MD;  Location: [facility];  Service: Orthopedics;  Laterality: Left;  . I&D EXTREMITY Left 02/06/2014   Procedure: IRRIGATION AND DEBRIDEMENT LEFT LEG WOUND ;  Surgeon: Theodoro Kos, DO;  Location: [facility];  Service: Plastics;  Laterality: Left;  . I&D EXTREMITY Left 08/09/2014   Procedure: IRRIGATION AND DEBRIDEMENT  OF LEFT LOWER LEG;  Surgeon: Theodoro Kos, DO;  Location: [facility];  Service: Plastics;  Laterality: Left;  . LUMBAR FUSION  01-02-2009   L3-L5  . RADIOACTIVE SEED GUIDED EXCISIONAL BREAST BIOPSY Left 07/25/2015   Procedure: LEFT RADIOACTIVE SEED GUIDED EXCISIONAL BREAST BIOPSY;  Surgeon: Rolm Bookbinder, MD;  Location: [facility];  Service: General;  Laterality: Left;  . RE-EXCISION OF BREAST LUMPECTOMY Left 09/05/2015   Procedure: RE-EXCISION OF LEFT BREAST LUMPECTOMY;  Surgeon: Rolm Bookbinder, MD;  Location: [facility];  Service: General;  Laterality: Left;  . SHOULDER OPEN ROTATOR CUFF REPAIR Left 1997  . TIBIA IM NAIL INSERTION Left 06/21/2013   Procedure: INTRAMEDULLARY (IM) NAIL TIBIAL;  Surgeon: Meredith Pel, MD;  Location: [facility];  Service: Orthopedics;  Laterality: Left;  . TONSILLECTOMY  AS CHILD  . TOTAL HIP ARTHROPLASTY Right 06-07-2010  . TRANSTHORACIC ECHOCARDIOGRAM  09-11-2010   DR MCALHANY   LVSF  55-60%/  MILD MV CALCIFICATION WITH NO STENOSIS/  MILD MR & TR / MODERATE LAE/  MODERATE TO SEVERE RAE  . UPPER GI ENDOSCOPY  03-29-2010    Current Outpatient Medications  Medication Sig Dispense Refill  . acetaminophen (TYLENOL) 500 MG tablet Take 500 mg by mouth daily as needed for mild pain.     [name] anastrozole (ARIMIDEX) 1 MG tablet Take 1 tablet (1 mg total) by mouth daily. 90 tablet 3  . apixaban  (ELIQUIS) 5 MG TABS tablet Take 1 tablet (5 mg total) by mouth 2 (two) times daily. 180 tablet 2  . atorvastatin (LIPITOR) 10 MG tablet Take 1 tablet (10 mg total) by mouth every evening. 90 tablet 1  . B Complex-C (B-COMPLEX WITH VITAMIN C) tablet Take 1 tablet by mouth daily.    . busPIRone (BUSPAR) 7.5 MG tablet Take 1 tablet (7.5 mg total) by mouth 2 (two) times daily. 180 tablet 1  . Cholecalciferol (VITAMIN D3) 1000 UNITS tablet Take 1,000 Units by mouth daily.      [name] diltiazem (CARDIZEM CD) 120 MG 24 hr capsule TAKE 1 CAPSULE (120 MG TOTAL) BY MOUTH DAILY. 90 capsule 0  . doxycycline (VIBRA-TABS) 100 MG tablet Take 1 tablet (100 mg total) by mouth 2 (two) times daily. 60 tablet 11  .  doxycycline (VIBRAMYCIN) 100 MG capsule Take 100 mg by mouth 2 (two) times daily.  10  . furosemide (LASIX) 40 MG tablet Take 1 tablet (40 mg total) by mouth as directed. 1 po on Mon-Wed-Fri in am 90 tablet 1  . levothyroxine (SYNTHROID, LEVOTHROID) 25 MCG tablet TAKE 1 TABLET BY MOUTH ONCE DAILY FOR THYROID 90 tablet 1  . linaclotide (LINZESS) 290 MCG CAPS capsule Take 1 capsule (290 mcg total) by mouth daily. 90 capsule 3  . metoprolol succinate (TOPROL-XL) 50 MG 24 hr tablet Take 1 tablet (50 mg total) by mouth 2 (two) times daily. Take with or immediately following a meal. 180 tablet 1  . Multiple Vitamins-Minerals (MULTIVITAMIN WITH MINERALS) tablet Take 1 tablet by mouth daily.    [name] omeprazole (PRILOSEC) 20 MG capsule Take 20 mg by mouth daily as needed (acid reflux).     [name] oxybutynin (DITROPAN) 5 MG tablet Take 1 tablet (5 mg total) by mouth 2 (two) times daily. 60 tablet 5  . polyethylene glycol powder (GLYCOLAX/MIRALAX) powder Take 17-34 g by mouth 2 (two) times daily as needed for moderate constipation. 850 g 5  . potassium chloride SA (K-DUR,KLOR-CON) 20 MEQ tablet Take 1 tablet (20 mEq total) by mouth daily. 90 tablet 1  . temazepam (RESTORIL) 30 MG capsule Take 1 capsule (30 mg total) by mouth at  bedtime. 30 capsule 5  . vitamin C (ASCORBIC ACID) 500 MG tablet Take 500 mg by mouth daily.    [name] zinc sulfate (ZINC-220) 220 (50 Zn) MG capsule Take 220 mg by mouth daily.     No current facility-administered medications for this visit.     Allergies  Allergen Reactions  . Zosyn [Piperacillin Sod-Tazobactam So] Hives, Rash and Other (See Comments)    Morbilliform eruption with itching  . Atenolol Nausea And Vomiting  . Clarithromycin Nausea And Vomiting  . Codeine Sulfate Nausea Only  . Levaquin [Levofloxacin] Nausea Only  . Macrodantin Other (See Comments)    UNSPECIFIED   . Oxycodone-Acetaminophen Nausea And Vomiting  . Percocet [Oxycodone-Acetaminophen] Nausea And Vomiting    Social History   Socioeconomic History  . Marital status: Married    Spouse name: Not on file  . Number of children: 2  . Years of education: Not on file  . Highest education level: Not on file  Occupational History  . Occupation: retired    Fish farm manager: UNEMPLOYED  Social Needs  . Financial resource strain: Not on file  . Food insecurity:    Worry: Not on file    Inability: Not on file  . Transportation needs:    Medical: Not on file    Non-medical: Not on file  Tobacco Use  . Smoking status: Never Smoker  . Smokeless tobacco: Never Used  Substance and Sexual Activity  . Alcohol use: No    Comment: 1 glass occ wine 08/03/14- none in a year  . Drug use: No  . Sexual activity: Not on file  Lifestyle  . Physical activity:    Days per week: Not on file    Minutes per session: Not on file  . Stress: Not on file  Relationships  . Social connections:    Talks on phone: Not on file    Gets together: Not on file    Attends religious service: Not on file    Active member of club or organization: Not on file    Attends meetings of clubs or organizations: Not on file    Relationship  status: Not on file  . Intimate partner violence:    Fear of current or ex partner: Not on file    Emotionally  abused: Not on file    Physically abused: Not on file    Forced sexual activity: Not on file  Other Topics Concern  . Not on file  Social History Narrative  . Not on file    Family History  Problem Relation Age of Onset  . Cancer Father   . Hypertension Other     Review of Systems:  As stated in the HPI and otherwise negative.   BP (!) 148/80   Pulse 74   Ht 5\' 5"  (1.651 m)   Wt 176 lb 6.4 oz (80 kg)   SpO2 97%   BMI 29.35 kg/m   Physical Examination:  General: Well developed, well nourished, NAD  HEENT: OP clear, mucus membranes moist  SKIN: warm, dry. No rashes. Neuro: No focal deficits  Musculoskeletal: Muscle strength 5/5 all ext  Psychiatric: Mood and affect normal  Neck: No JVD, no carotid bruits, no thyromegaly, no lymphadenopathy.  Lungs:Clear bilaterally, no wheezes, rhonci, crackles Cardiovascular: Regular rate and rhythm. No murmurs, gallops or rubs. Abdomen:Soft. Bowel sounds present. Non-tender.  Extremities: No lower extremity edema. Pulses are 2 + in the bilateral DP/PT.  EKG:  EKG is not ordered today. The ekg ordered today demonstrates   Recent Labs: 05/20/2017: TSH 0.65 07/28/2017: ALT 20; BUN 12; Creatinine, Ser 1.02; Hemoglobin 12.8; Platelets 196; Potassium 3.6; Sodium 135   Lipid Panel Followed in primary care.    Wt Readings from Last 3 Encounters:  10/29/17 176 lb 6.4 oz (80 kg)  09/01/17 178 lb (80.7 kg)  07/28/17 180 lb 3.2 oz (81.7 kg)     Other studies Reviewed: Additional studies/ records that were reviewed today include: . Review of the above records demonstrates:    Assessment and Plan:   1. Atrial fibrillation, persistent: Rate controlled atrial fib today. Continue Cardizem, Toprol and Eliquis.     2. HLD: This is followed in primary care. Continue statin  3. Chronic diastolic CHF: LV systolic function is normal by echo in 2018. Volume status is at baseline. Continue Lasix.   Current medicines are reviewed at length with  the patient today.  The patient does not have concerns regarding medicines.  The following changes have been made:  no change  Labs/ tests ordered today include:   No orders of the defined types were placed in this encounter.   Disposition:   FU with me in 6 months  Signed, Lauree Chandler, MD 10/29/2017 12:07 PM    [facility] [place], [place], [place]  [address] Phone: [phone]; Fax: [phone]

## 2017-11-02 ENCOUNTER — Other Ambulatory Visit: Payer: Self-pay | Admitting: Internal Medicine

## 2017-11-02 NOTE — Telephone Encounter (Signed)
Copied from [place] [id number]. Topic: Quick Communication - Rx Refill/Question >> Nov 02, 2017 11:56 AM Waylan Rocher, [place] L wrote: Medication: potassium chloride SA (K-DUR,KLOR-CON) 20 MEQ tablet (4 tablets left)  Has the patient contacted their pharmacy? Yes.  Leisure centre manager at [employer] (Agent: If no, request that the patient contact the pharmacy for the refill.) (Agent: If yes, when and what did the pharmacy advise?)  Preferred Pharmacy (with phone number or street name): [facility], [place] - [place] [address] [place] [address] Phone: [phone] Fax: [phone]  Agent: Please be advised that RX refills may take up to 3 business days. We ask that you follow-up with your pharmacy.

## 2017-11-03 MED ORDER — POTASSIUM CHLORIDE CRYS ER 20 MEQ PO TBCR: 20.0000 meq | EXTENDED_RELEASE_TABLET | Freq: Every day | ORAL | 2 refills | Status: DC

## 2017-11-10 ENCOUNTER — Other Ambulatory Visit: Payer: Self-pay | Admitting: Internal Medicine

## 2017-11-10 MED ORDER — LINACLOTIDE 290 MCG PO CAPS: 290.0000 ug | ORAL_CAPSULE | Freq: Every day | ORAL | 3 refills | Status: DC

## 2017-11-10 NOTE — Telephone Encounter (Signed)
Reviewed chart pt is up-to-date sent refills to [facility].../lmb  

## 2017-11-10 NOTE — Telephone Encounter (Signed)
Copied from [place] [id number]. Topic: Quick Communication - Rx Refill/Question >> Nov 10, 2017 11:25 AM Keene Breath wrote: Medication: linaclotide (LINZESS) 290 MCG CAPS capsule  Patient called to request a refill for the above medication.  CB#  Preferred Pharmacy (with phone number or street name): [facility], [place] - [place] [phone] (Phone) [phone] (Fax)

## 2017-11-10 NOTE — Telephone Encounter (Signed)
Requested medication (s) are due for refill today:  yes  Requested medication (s) are on the active medication list:  yes  Future visit scheduled:  yes  Last Refill:  07/03/16; # 90; RF x 3; meets refill protocol, however prescription expired.  Requested Prescriptions  Pending Prescriptions Disp Refills   linaclotide (LINZESS) 290 MCG CAPS capsule 90 capsule 3    Sig: Take 1 capsule (290 mcg total) by mouth daily.     Gastroenterology: Irritable Bowel Syndrome Passed - 11/10/2017 12:59 PM      Passed - Valid encounter within last 12 months    Recent Outpatient Visits          2 months ago Atrial flutter, unspecified type ([place])   Therapist, music [facility] -[place] [name], [name], MD   5 months ago Community acquired pneumonia of left lung, unspecified part of lung   [name], MD   5 months ago Other iron deficiency anemia   [place], MD   8 months ago Acute right-sided low back pain without sciatica   [place], Enid Baas, MD   8 months ago Atrial flutter, unspecified type [facility])   [place], MD      Future Appointments            In 3 weeks [name], [name], MD [place], [place]

## 2017-11-12 ENCOUNTER — Telehealth: Payer: Self-pay | Admitting: Internal Medicine

## 2017-11-12 NOTE — Telephone Encounter (Signed)
Copied from [place] [phone]. Topic: Quick Communication - See Telephone Encounter >> Nov 12, 2017 11:35 AM Percell Belt A wrote: CRM for notification. See Telephone encounter for: 11/12/17.  Pt called in wanted to speak to [facility] nurse.  Denied giving me any other info.  Stated she only wanted to speak with nurse and would tell what she needed.   Best number  [phone]

## 2017-11-13 NOTE — Telephone Encounter (Signed)
Pt would like to know if the Oxybutynin can be decreased to see if it will help with her constipation. Please advise

## 2017-11-16 NOTE — Telephone Encounter (Signed)
Yes. Try Oxybutynin qd or stop. Thx

## 2017-11-17 ENCOUNTER — Other Ambulatory Visit: Payer: Self-pay | Admitting: Internal Medicine

## 2017-11-17 ENCOUNTER — Other Ambulatory Visit: Payer: Self-pay | Admitting: *Deleted

## 2017-11-17 DIAGNOSIS — Z23 Encounter for immunization: Secondary | ICD-10-CM | POA: Diagnosis not present

## 2017-11-17 MED ORDER — APIXABAN 5 MG PO TABS: 5.0000 mg | ORAL_TABLET | Freq: Two times a day (BID) | ORAL | 3 refills | Status: DC

## 2017-11-17 NOTE — Telephone Encounter (Signed)
Pt is a 84 yr old female who saw Dr. Angelena Form on 10/29/17, weight was 80Kg. Last SCr was 1.02 on 07/28/17. Will Eliquis 5mg  BID.

## 2017-11-17 NOTE — Telephone Encounter (Signed)
Pt.notified

## 2017-11-17 NOTE — Telephone Encounter (Signed)
Copied from [place] [phone]. Topic: Quick Communication - See Telephone Encounter >> Nov 17, 2017 11:19 AM Ahmed Prima L wrote: CRM for notification. See Telephone encounter for: 11/17/17.  temazepam (RESTORIL) 30 MG capsule  [place], [place] - [place] [address] Phone: [phone] Fax: [phone]

## 2017-11-17 NOTE — Telephone Encounter (Signed)
Requested medication (s) are due for refill today: yes  Requested medication (s) are on the active medication list: yes  Last refill:  05/22/17 #30 with 5 refills  Future visit scheduled: yes, 11/23/17     Requested Prescriptions  Pending Prescriptions Disp Refills   temazepam (RESTORIL) 30 MG capsule 30 capsule 5    Sig: Take 1 capsule (30 mg total) by mouth at bedtime.     Not Delegated - Psychiatry:  Anxiolytics/Hypnotics Failed - 11/17/2017 11:30 AM      Failed - This refill cannot be delegated      Failed - Urine Drug Screen completed in last 360 days.      Passed - Valid encounter within last 6 months    Recent Outpatient Visits          2 months ago Atrial flutter, unspecified type [facility])   Therapist, music Primary Care -[place] [name], [name], MD   5 months ago Community acquired pneumonia of left lung, unspecified part of lung   [place], MD   6 months ago Other iron deficiency anemia   [place], MD   8 months ago Acute right-sided low back pain without sciatica   [place], Jeremy E, MD   9 months ago Atrial flutter, unspecified type [facility])   [place], MD      Future Appointments            In 2 weeks [name], [name], MD [place], [place]

## 2017-11-18 MED ORDER — TEMAZEPAM 30 MG PO CAPS: 30.0000 mg | ORAL_CAPSULE | Freq: Every day | ORAL | 5 refills | Status: DC

## 2017-11-26 ENCOUNTER — Other Ambulatory Visit: Payer: Self-pay | Admitting: Internal Medicine

## 2017-11-26 NOTE — Telephone Encounter (Signed)
Copied from [place] [phone]. Topic: Quick Communication - See Telephone Encounter >> Nov 26, 2017 10:10 AM Ivar Drape wrote: CRM for notification. See Telephone encounter for: 11/26/17. [facility] [phone] is requesting a refill on the patient's atorvastatin (LIPITOR) 10 MG tablet medication.

## 2017-11-26 NOTE — Telephone Encounter (Signed)
Requested medication (s) are due for refill today: yes  Requested medication (s) are on the active medication list: yes    Last refill: 05/29/17  #90 0 refills  Future visit scheduled yes 12/03/17   Notes to clinic:Labs due; last panel 06/25/2012  Requested Prescriptions  Pending Prescriptions Disp Refills   atorvastatin (LIPITOR) 10 MG tablet 90 tablet 1    Sig: Take 1 tablet (10 mg total) by mouth every evening.     Cardiovascular:  Antilipid - Statins Failed - 11/26/2017 10:15 AM      Failed - Total Cholesterol in normal range and within 360 days    Cholesterol  Date Value Ref Range Status  06/25/2012 116 0 - 200 mg/dL Final    Comment:    ATP III Classification       Desirable:  < 200 mg/dL               Borderline High:  200 - 239 mg/dL          High:  > = 240 mg/dL         Failed - LDL in normal range and within 360 days    LDL Cholesterol  Date Value Ref Range Status  06/25/2012 66 0 - 99 mg/dL Final         Failed - HDL in normal range and within 360 days    HDL  Date Value Ref Range Status  06/25/2012 41.50 >39.00 mg/dL Final         Failed - Triglycerides in normal range and within 360 days    Triglycerides  Date Value Ref Range Status  09/05/2013 82 <150 mg/dL Final         Passed - Patient is not pregnant      Passed - Valid encounter within last 12 months    Recent Outpatient Visits          2 months ago Atrial flutter, unspecified type ([place])   Therapist, music Primary [facility] -[place] [name], [name], MD   5 months ago Community acquired pneumonia of left lung, unspecified part of lung   [place] [name], [name], MD   6 months ago Other iron deficiency anemia   [place] [name], [name], MD   8 months ago Acute right-sided low back pain without sciatica   [place], Enid Baas, MD   9 months ago Atrial flutter, unspecified type [facility])   [place], [name], MD      Future Appointments            In 1 week [name], [name], MD [place], [place]

## 2017-11-27 MED ORDER — ATORVASTATIN CALCIUM 10 MG PO TABS: 10.0000 mg | ORAL_TABLET | Freq: Every evening | ORAL | 1 refills | Status: DC

## 2017-11-30 DIAGNOSIS — Z85828 Personal history of other malignant neoplasm of skin: Secondary | ICD-10-CM | POA: Diagnosis not present

## 2017-11-30 DIAGNOSIS — C4912 Malignant neoplasm of connective and soft tissue of left upper limb, including shoulder: Secondary | ICD-10-CM | POA: Diagnosis not present

## 2017-11-30 DIAGNOSIS — D485 Neoplasm of uncertain behavior of skin: Secondary | ICD-10-CM | POA: Diagnosis not present

## 2017-11-30 DIAGNOSIS — L820 Inflamed seborrheic keratosis: Secondary | ICD-10-CM | POA: Diagnosis not present

## 2017-12-03 ENCOUNTER — Ambulatory Visit (INDEPENDENT_AMBULATORY_CARE_PROVIDER_SITE_OTHER): Payer: Medicare Other | Admitting: Internal Medicine

## 2017-12-03 ENCOUNTER — Encounter: Payer: Self-pay | Admitting: Internal Medicine

## 2017-12-03 ENCOUNTER — Other Ambulatory Visit (INDEPENDENT_AMBULATORY_CARE_PROVIDER_SITE_OTHER): Payer: Medicare Other

## 2017-12-03 DIAGNOSIS — E039 Hypothyroidism, unspecified: Secondary | ICD-10-CM

## 2017-12-03 DIAGNOSIS — K5901 Slow transit constipation: Secondary | ICD-10-CM | POA: Diagnosis not present

## 2017-12-03 DIAGNOSIS — M545 Low back pain, unspecified: Secondary | ICD-10-CM

## 2017-12-03 DIAGNOSIS — I4892 Unspecified atrial flutter: Secondary | ICD-10-CM | POA: Diagnosis not present

## 2017-12-03 DIAGNOSIS — I10 Essential (primary) hypertension: Secondary | ICD-10-CM | POA: Diagnosis not present

## 2017-12-03 LAB — BASIC METABOLIC PANEL
BUN: 11 mg/dL (ref 6–23)
CO2: 30 mEq/L (ref 19–32)
Calcium: 9.5 mg/dL (ref 8.4–10.5)
Chloride: 99 mEq/L (ref 96–112)
Creatinine, Ser: 0.99 mg/dL (ref 0.40–1.20)
GFR: 56 mL/min — ABNORMAL LOW (ref >60.00)
Glucose, Bld: 104 mg/dL — ABNORMAL HIGH (ref 70–99)
Potassium: 3.7 mEq/L (ref 3.5–5.1)
Sodium: 137 mEq/L (ref 135–145)

## 2017-12-03 NOTE — Progress Notes (Signed)
Subjective:  Patient ID: [name], female    DOB: 1925/04/24  Age: 84 y.o. MRN: [id number]  CC: No chief complaint on file.   HPI [name] presents for LBP, HTN, constipation f/u  Outpatient Medications Prior to Visit  Medication Sig Dispense Refill  . acetaminophen (TYLENOL) 500 MG tablet Take 500 mg by mouth daily as needed for mild pain.     [name] anastrozole (ARIMIDEX) 1 MG tablet Take 1 tablet (1 mg total) by mouth daily. 90 tablet 3  . apixaban (ELIQUIS) 5 MG TABS tablet Take 1 tablet (5 mg total) by mouth 2 (two) times daily. 180 tablet 3  . atorvastatin (LIPITOR) 10 MG tablet Take 1 tablet (10 mg total) by mouth every evening. 90 tablet 1  . B Complex-C (B-COMPLEX WITH VITAMIN C) tablet Take 1 tablet by mouth daily.    . busPIRone (BUSPAR) 7.5 MG tablet Take 1 tablet (7.5 mg total) by mouth 2 (two) times daily. 180 tablet 1  . Cholecalciferol (VITAMIN D3) 1000 UNITS tablet Take 1,000 Units by mouth daily.      [name] diltiazem (CARDIZEM CD) 120 MG 24 hr capsule TAKE 1 CAPSULE (120 MG TOTAL) BY MOUTH DAILY. 90 capsule 0  . doxycycline (VIBRA-TABS) 100 MG tablet Take 1 tablet (100 mg total) by mouth 2 (two) times daily. 60 tablet 11  . doxycycline (VIBRAMYCIN) 100 MG capsule Take 100 mg by mouth 2 (two) times daily.  10  . furosemide (LASIX) 40 MG tablet Take 1 tablet (40 mg total) by mouth as directed. 1 po on Mon-Wed-Fri in am 90 tablet 1  . levothyroxine (SYNTHROID, LEVOTHROID) 25 MCG tablet TAKE 1 TABLET BY MOUTH ONCE DAILY FOR THYROID 90 tablet 1  . linaclotide (LINZESS) 290 MCG CAPS capsule Take 1 capsule (290 mcg total) by mouth daily. 90 capsule 3  . metoprolol succinate (TOPROL-XL) 50 MG 24 hr tablet Take 1 tablet (50 mg total) by mouth 2 (two) times daily. Take with or immediately following a meal. 180 tablet 1  . Multiple Vitamins-Minerals (MULTIVITAMIN WITH MINERALS) tablet Take 1 tablet by mouth daily.    [name] omeprazole (PRILOSEC) 20 MG capsule Take 20 mg by mouth daily  as needed (acid reflux).     [name] oxybutynin (DITROPAN) 5 MG tablet Take 1 tablet (5 mg total) by mouth 2 (two) times daily. 60 tablet 5  . polyethylene glycol powder (GLYCOLAX/MIRALAX) powder Take 17-34 g by mouth 2 (two) times daily as needed for moderate constipation. 850 g 5  . potassium chloride SA (K-DUR,KLOR-CON) 20 MEQ tablet Take 1 tablet (20 mEq total) by mouth daily. 90 tablet 2  . temazepam (RESTORIL) 30 MG capsule Take 1 capsule (30 mg total) by mouth at bedtime. 30 capsule 5  . vitamin C (ASCORBIC ACID) 500 MG tablet Take 500 mg by mouth daily.    [name] zinc sulfate (ZINC-220) 220 (50 Zn) MG capsule Take 220 mg by mouth daily.     No facility-administered medications prior to visit.     ROS: Review of Systems  Constitutional: Negative for activity change, appetite change, chills, fatigue and unexpected weight change.  HENT: Negative for congestion, mouth sores and sinus pressure.   Eyes: Negative for visual disturbance.  Respiratory: Negative for cough and chest tightness.   Gastrointestinal: Negative for abdominal pain and nausea.  Genitourinary: Negative for difficulty urinating, frequency and vaginal pain.  Musculoskeletal: Positive for arthralgias, back pain and gait problem.  Skin: Negative for pallor and rash.  Neurological: Negative for dizziness, tremors, weakness, numbness and headaches.  Psychiatric/Behavioral: Negative for confusion and sleep disturbance.    Objective:  BP 136/74 (BP Location: Left Arm, Patient Position: Sitting, Cuff Size: Normal)   Pulse 83   Temp 98 F (36.7 C) (Oral)   Ht 5\' 5"  (1.651 m)   Wt 181 lb (82.1 kg)   SpO2 98%   BMI 30.12 kg/m   BP Readings from Last 3 Encounters:  12/03/17 136/74  10/29/17 (!) 148/80  09/01/17 136/82    Wt Readings from Last 3 Encounters:  12/03/17 181 lb (82.1 kg)  10/29/17 176 lb 6.4 oz (80 kg)  09/01/17 178 lb (80.7 kg)    Physical Exam  Constitutional: She appears well-developed. No distress.    HENT:  Head: Normocephalic.  Right Ear: External ear normal.  Left Ear: External ear normal.  Nose: Nose normal.  Mouth/Throat: Oropharynx is clear and moist.  Eyes: Pupils are equal, round, and reactive to light. Conjunctivae are normal. Right eye exhibits no discharge. Left eye exhibits no discharge.  Neck: Normal range of motion. Neck supple. No JVD present. No tracheal deviation present. No thyromegaly present.  Cardiovascular: Normal rate, regular rhythm and normal heart sounds.  Pulmonary/Chest: No stridor. No respiratory distress. She has no wheezes.  Abdominal: Soft. Bowel sounds are normal. She exhibits no distension and no mass. There is no tenderness. There is no rebound and no guarding.  Musculoskeletal: She exhibits tenderness. She exhibits no edema.  Lymphadenopathy:    She has no cervical adenopathy.  Neurological: She displays normal reflexes. No cranial nerve deficit. She exhibits normal muscle tone. Coordination abnormal.  Skin: No rash noted. No erythema.  Psychiatric: She has a normal mood and affect. Her behavior is normal. Judgment and thought content normal.  LS w/pain Walker Treated lesions on skin  Lab Results  Component Value Date   WBC 4.8 07/28/2017   HGB 12.8 07/28/2017   HCT 38.5 07/28/2017   PLT 196 07/28/2017   GLUCOSE 99 07/28/2017   CHOL 116 06/25/2012   TRIG 82 09/05/2013   HDL 41.50 06/25/2012   LDLDIRECT 110.3 05/24/2008   LDLCALC 66 06/25/2012   ALT 20 07/28/2017   AST 22 07/28/2017   NA 135 07/28/2017   K 3.6 07/28/2017   CL 96 (L) 07/28/2017   CREATININE 1.02 (H) 07/28/2017   BUN 12 07/28/2017   CO2 31 07/28/2017   TSH 0.65 05/20/2017   INR 1.33 05/23/2017   HGBA1C 6.1 (H) 09/08/2013    Dg Chest 2 View  Result Date: 06/13/2017 CLINICAL DATA:  Followup left lung pneumonia. EXAM: CHEST - 2 VIEW COMPARISON:  05/23/2017 FINDINGS: Mild cardiac enlargement. Aortic atherosclerosis. No pleural effusions or edema. No airspace opacities.  Spondylosis noted throughout the thoracic spine. IMPRESSION: Resolution of previous left lower lobe pneumonia. Aortic Atherosclerosis (ICD10-I70.0). Electronically Signed   By: Kerby Moors M.D.   On: 06/13/2017 11:51    Assessment & Plan:   There are no diagnoses linked to this encounter.   No orders of the defined types were placed in this encounter.    Follow-up: No follow-ups on file.  Walker Kehr, MD

## 2017-12-03 NOTE — Assessment & Plan Note (Signed)
Eliquis Recurrent

## 2017-12-03 NOTE — Assessment & Plan Note (Signed)
Linzess Miralax

## 2017-12-03 NOTE — Assessment & Plan Note (Signed)
[name]

## 2017-12-03 NOTE — Assessment & Plan Note (Signed)
Metoprolol, Diltiazem

## 2017-12-03 NOTE — Assessment & Plan Note (Signed)
Levothroid 

## 2017-12-08 ENCOUNTER — Other Ambulatory Visit: Payer: Self-pay

## 2017-12-21 DIAGNOSIS — Z85828 Personal history of other malignant neoplasm of skin: Secondary | ICD-10-CM | POA: Diagnosis not present

## 2017-12-21 DIAGNOSIS — D481 Neoplasm of uncertain behavior of connective and other soft tissue: Secondary | ICD-10-CM | POA: Diagnosis not present

## 2017-12-21 DIAGNOSIS — C44629 Squamous cell carcinoma of skin of left upper limb, including shoulder: Secondary | ICD-10-CM | POA: Diagnosis not present

## 2017-12-24 ENCOUNTER — Telehealth: Payer: Self-pay | Admitting: Cardiovascular Disease

## 2017-12-24 NOTE — Telephone Encounter (Signed)
New message     Patient calling the office for samples of medication:   1.  What medication and dosage are you requesting samples for? Eliquis  2.  Are you currently out of this medication? No

## 2017-12-24 NOTE — Telephone Encounter (Signed)
Spoke with patient and she states that she is in the donut hole. She paid the donut hole cost last refill but she cannot afford it this time. She will only be one week short on medication of making it to the first of the year. I made her aware that I would place one week of samples at the front for her to pick up. She states that she would have her husband come tomorrow. I made her aware that the elevators would shut off at 11:30 AM tomorrow so be sure that he gets here prior to that time. She verbalized her understanding and was very appreciation for my assistance.

## 2017-12-30 DIAGNOSIS — L0889 Other specified local infections of the skin and subcutaneous tissue: Secondary | ICD-10-CM | POA: Diagnosis not present

## 2018-01-11 ENCOUNTER — Other Ambulatory Visit: Payer: Self-pay | Admitting: Internal Medicine

## 2018-01-11 MED ORDER — OXYBUTYNIN CHLORIDE 5 MG PO TABS: 5.0000 mg | ORAL_TABLET | Freq: Two times a day (BID) | ORAL | 1 refills | Status: DC

## 2018-01-11 NOTE — Telephone Encounter (Signed)
Copied from [place] [id number]. Topic: Quick Communication - Rx Refill/Question >> Jan 11, 2018 10:59 AM Celedonio Savage L wrote: Medication: oxybutynin (DITROPAN) 5 MG tablet  diltiazem (CARDIZEM CD) 120 MG 24 hr capsule  Has the patient contacted their pharmacy? Yes.  Pharmacy called (Agent: If no, request that the patient contact the pharmacy for the refill.) (Agent: If yes, when and what did the pharmacy advise?)  Preferred Pharmacy (with phone number or street name):     [place], [place] - [place] [phone] (Phone) [phone] (Fax)    Agent: Please be advised that RX refills may take up to 3 business days. We ask that you follow-up with your pharmacy.

## 2018-01-11 NOTE — Telephone Encounter (Signed)
Requested medication (s) are due for refill today: Yes  Requested medication (s) are on the active medication list: Yes  Last refill:  10/12/17  Future visit scheduled: Yes  Notes to clinic:  Unable to refill per protocol     Requested Prescriptions  Pending Prescriptions Disp Refills   diltiazem (CARDIZEM CD) 120 MG 24 hr capsule 90 capsule 0    Sig: TAKE 1 CAPSULE (120 MG TOTAL) BY MOUTH DAILY.     Off-Protocol Failed - 01/11/2018 11:48 AM      Failed - Medication not assigned to a protocol, review manually.      Passed - Valid encounter within last 12 months    Recent Outpatient Visits          1 month ago Essential hypertension   [place] [name], [name], MD   4 months ago Atrial flutter, unspecified type [facility])   Therapist, music Primary Care -[place] [name], [name], MD   7 months ago Community acquired pneumonia of left lung, unspecified part of lung   [place] [name], [name], MD   7 months ago Other iron deficiency anemia   [place] [name], [name], MD   10 months ago Acute right-sided low back pain without sciatica   [place], Jeremy E, MD      Future Appointments            In 1 month [name], [name], MD [place] [facility]         Signed Prescriptions Disp Refills   oxybutynin (DITROPAN) 5 MG tablet 180 tablet 1    Sig: Take 1 tablet (5 mg total) by mouth 2 (two) times daily.     Urology:  Bladder Agents Passed - 01/11/2018 11:48 AM      Passed - Valid encounter within last 12 months    Recent Outpatient Visits          1 month ago Essential hypertension   [place] [name], [name], MD   4 months ago Atrial flutter, unspecified type [facility])   [place], [name], MD   7 months ago Community acquired pneumonia of  left lung, unspecified part of lung   [place], MD   7 months ago Other iron deficiency anemia   [place], MD   10 months ago Acute right-sided low back pain without sciatica   [place], Enid Baas, MD      Future Appointments            In 1 month [name], [name], MD [place], [place]

## 2018-01-12 MED ORDER — DILTIAZEM HCL ER COATED BEADS 120 MG PO CP24: ORAL_CAPSULE | ORAL | 1 refills | Status: DC

## 2018-02-08 ENCOUNTER — Other Ambulatory Visit: Payer: Self-pay | Admitting: Internal Medicine

## 2018-02-08 MED ORDER — LEVOTHYROXINE SODIUM 25 MCG PO TABS: ORAL_TABLET | ORAL | 1 refills | Status: DC

## 2018-02-08 NOTE — Telephone Encounter (Signed)
Requested medication (s) are due for refill today -yes  Requested medication (s) are on the active medication list -yes  Future visit scheduled -yes  Last refill: 08/13/17  Notes to clinic: Patient is requesting refill of non-delegated Rx. Sent for PCP review.  Requested Prescriptions  Pending Prescriptions Disp Refills   busPIRone (BUSPAR) 7.5 MG tablet 180 tablet 1    Sig: Take 1 tablet (7.5 mg total) by mouth 2 (two) times daily.     Not Delegated - Psychiatry:  Anxiolytics/Hypnotics Failed - 02/08/2018 10:33 AM      Failed - This refill cannot be delegated      Failed - Urine Drug Screen completed in last 360 days.      Passed - Valid encounter within last 6 months    Recent Outpatient Visits          2 months ago Essential hypertension   [place], [name], MD   5 months ago Atrial flutter, unspecified type [facility])   Therapist, music Primary Care -[place] [name], [name], MD   8 months ago Community acquired pneumonia of left lung, unspecified part of lung   [place] [name], [name], MD   8 months ago Other iron deficiency anemia   [place], MD   11 months ago Acute right-sided low back pain without sciatica   [place], Enid Baas, MD      Future Appointments            In 1 month [name], [name], MD [place], [facility]         Signed Prescriptions Disp Refills   levothyroxine (SYNTHROID, LEVOTHROID) 25 MCG tablet 90 tablet 1    Sig: TAKE 1 TABLET BY MOUTH ONCE DAILY FOR THYROID     Endocrinology:  Hypothyroid Agents Failed - 02/08/2018 10:33 AM      Failed - TSH needs to be rechecked within 3 months after an abnormal result. Refill until TSH is due.      Passed - TSH in normal range and within 360 days    TSH  Date Value Ref Range Status  05/20/2017 0.65 0.35 - 4.50 uIU/mL  Final         Passed - Valid encounter within last 12 months    Recent Outpatient Visits          2 months ago Essential hypertension   [place], [name], MD   5 months ago Atrial flutter, unspecified type [facility])   Therapist, music Primary Care -[place] [name], [name], MD   8 months ago Community acquired pneumonia of left lung, unspecified part of lung   [place] [name], [name], MD   8 months ago Other iron deficiency anemia   [place], MD   11 months ago Acute right-sided low back pain without sciatica   [place], MD      Future Appointments            In 1 month [name], [name], MD [place], [facility]            Requested Prescriptions  Pending Prescriptions Disp Refills   busPIRone (BUSPAR) 7.5 MG tablet 180 tablet 1    Sig: Take 1 tablet (7.5 mg total) by mouth  2 (two) times daily.     Not Delegated - Psychiatry:  Anxiolytics/Hypnotics Failed - 02/08/2018 10:33 AM      Failed - This refill cannot be delegated      Failed - Urine Drug Screen completed in last 360 days.      Passed - Valid encounter within last 6 months    Recent Outpatient Visits          2 months ago Essential hypertension   [place], [name], MD   5 months ago Atrial flutter, unspecified type [facility])   Therapist, music Primary Care -[place] [name], [name], MD   8 months ago Community acquired pneumonia of left lung, unspecified part of lung   [place] [name], [name], MD   8 months ago Other iron deficiency anemia   [place], MD   11 months ago Acute right-sided low back pain without sciatica   [place], Enid Baas, MD      Future  Appointments            In 1 month [name], [name], MD [place], [facility]         Signed Prescriptions Disp Refills   levothyroxine (SYNTHROID, LEVOTHROID) 25 MCG tablet 90 tablet 1    Sig: TAKE 1 TABLET BY MOUTH ONCE DAILY FOR THYROID     Endocrinology:  Hypothyroid Agents Failed - 02/08/2018 10:33 AM      Failed - TSH needs to be rechecked within 3 months after an abnormal result. Refill until TSH is due.      Passed - TSH in normal range and within 360 days    TSH  Date Value Ref Range Status  05/20/2017 0.65 0.35 - 4.50 uIU/mL Final         Passed - Valid encounter within last 12 months    Recent Outpatient Visits          2 months ago Essential hypertension   [place], [name], MD   5 months ago Atrial flutter, unspecified type [facility])   [place], [name], MD   8 months ago Community acquired pneumonia of left lung, unspecified part of lung   [place], MD   8 months ago Other iron deficiency anemia   [place], MD   11 months ago Acute right-sided low back pain without sciatica   [place], Enid Baas, MD      Future Appointments            In 1 month [name], [name], MD [place], [place]

## 2018-02-08 NOTE — Telephone Encounter (Addendum)
Copied from [place] [phone]. Topic: Quick Communication - See Telephone Encounter >> Feb 08, 2018 10:04 AM Ivar Drape wrote: CRM for notification. See Telephone encounter for: 02/08/18. Patient would like a refill on the following medications and have them sent to her preferred pharmacy [facility] [address] [place]. Patient only has one tablet left:  1) busPIRone (BUSPAR) 7.5 MG tablet 2) levothyroxine (SYNTHROID, LEVOTHROID) 25 MCG tablet

## 2018-02-10 MED ORDER — BUSPIRONE HCL 7.5 MG PO TABS: 7.5000 mg | ORAL_TABLET | Freq: Two times a day (BID) | ORAL | 1 refills | Status: DC

## 2018-02-26 ENCOUNTER — Telehealth: Payer: Self-pay

## 2018-02-26 NOTE — Telephone Encounter (Signed)
PA approved through 11/28/18

## 2018-03-03 ENCOUNTER — Telehealth: Payer: Self-pay | Admitting: Internal Medicine

## 2018-03-03 NOTE — Telephone Encounter (Signed)
Insurance has been submitted and verified for Prolia. Patient is responsible for a $0 copay. Due anytime. Left message for patient to call back to schedule.  Okay to schedule... Visit Note: Prolia ($0 copay - okay to give per [place]) Visit Type: Nurse Provider: Nurse 

## 2018-03-09 ENCOUNTER — Telehealth: Payer: Self-pay | Admitting: *Deleted

## 2018-03-09 NOTE — Telephone Encounter (Signed)
LVM for patient to call back in regards to scheduling AWV with our health coach.  

## 2018-03-10 ENCOUNTER — Ambulatory Visit: Payer: Medicare Other

## 2018-03-10 ENCOUNTER — Ambulatory Visit (INDEPENDENT_AMBULATORY_CARE_PROVIDER_SITE_OTHER): Payer: Medicare Other | Admitting: Internal Medicine

## 2018-03-10 ENCOUNTER — Encounter: Payer: Self-pay | Admitting: Internal Medicine

## 2018-03-10 VITALS — BP 136/76 | HR 61 | Temp 98.0°F | Ht 65.0 in | Wt 182.0 lb

## 2018-03-10 DIAGNOSIS — M545 Low back pain, unspecified: Secondary | ICD-10-CM

## 2018-03-10 DIAGNOSIS — I4892 Unspecified atrial flutter: Secondary | ICD-10-CM | POA: Diagnosis not present

## 2018-03-10 DIAGNOSIS — K5901 Slow transit constipation: Secondary | ICD-10-CM | POA: Diagnosis not present

## 2018-03-10 DIAGNOSIS — M818 Other osteoporosis without current pathological fracture: Secondary | ICD-10-CM | POA: Diagnosis not present

## 2018-03-10 DIAGNOSIS — I10 Essential (primary) hypertension: Secondary | ICD-10-CM

## 2018-03-10 MED ORDER — DENOSUMAB 60 MG/ML ~~LOC~~ SOSY
60.0000 mg | PREFILLED_SYRINGE | Freq: Once | SUBCUTANEOUS | Status: AC
  Administered 2018-03-10: 60 mg via SUBCUTANEOUS

## 2018-03-10 NOTE — Assessment & Plan Note (Signed)
Tylenol prn [name]

## 2018-03-10 NOTE — Assessment & Plan Note (Signed)
Linzess 

## 2018-03-10 NOTE — Patient Instructions (Signed)
If you have medicare related insurance (such as traditional Medicare, Blue Cross Medicare, United HealthCare Medicare, or similar), Please make an appointment at the scheduling desk with Jill, the Wellness Health Coach, for your Wellness visit in this office, which is a benefit with your insurance.  

## 2018-03-10 NOTE — Progress Notes (Signed)
Subjective:  Patient ID: [name], female    DOB: Aug 05, 1925  Age: 85 y.o. MRN: [id number]  CC: No chief complaint on file.   HPI [name] presents for breast cancer, anxiety, A fib f/u  Outpatient Medications Prior to Visit  Medication Sig Dispense Refill  . acetaminophen (TYLENOL) 500 MG tablet Take 500 mg by mouth daily as needed for mild pain.     [name] anastrozole (ARIMIDEX) 1 MG tablet Take 1 tablet (1 mg total) by mouth daily. 90 tablet 3  . apixaban (ELIQUIS) 5 MG TABS tablet Take 1 tablet (5 mg total) by mouth 2 (two) times daily. 180 tablet 3  . atorvastatin (LIPITOR) 10 MG tablet Take 1 tablet (10 mg total) by mouth every evening. 90 tablet 1  . B Complex-C (B-COMPLEX WITH VITAMIN C) tablet Take 1 tablet by mouth daily.    . busPIRone (BUSPAR) 7.5 MG tablet Take 1 tablet (7.5 mg total) by mouth 2 (two) times daily. 180 tablet 1  . Cholecalciferol (VITAMIN D3) 1000 UNITS tablet Take 1,000 Units by mouth daily.      [name] diltiazem (CARDIZEM CD) 120 MG 24 hr capsule TAKE 1 CAPSULE (120 MG TOTAL) BY MOUTH DAILY. 90 capsule 1  . doxycycline (VIBRA-TABS) 100 MG tablet Take 1 tablet (100 mg total) by mouth 2 (two) times daily. 60 tablet 11  . doxycycline (VIBRAMYCIN) 100 MG capsule Take 100 mg by mouth 2 (two) times daily.  10  . furosemide (LASIX) 40 MG tablet Take 1 tablet (40 mg total) by mouth as directed. 1 po on Mon-Wed-Fri in am 90 tablet 1  . levothyroxine (SYNTHROID, LEVOTHROID) 25 MCG tablet TAKE 1 TABLET BY MOUTH ONCE DAILY FOR THYROID 90 tablet 1  . linaclotide (LINZESS) 290 MCG CAPS capsule Take 1 capsule (290 mcg total) by mouth daily. 90 capsule 3  . metoprolol succinate (TOPROL-XL) 50 MG 24 hr tablet Take 1 tablet (50 mg total) by mouth 2 (two) times daily. Take with or immediately following a meal. 180 tablet 1  . Multiple Vitamins-Minerals (MULTIVITAMIN WITH MINERALS) tablet Take 1 tablet by mouth daily.    [name] omeprazole (PRILOSEC) 20 MG capsule Take 20 mg by  mouth daily as needed (acid reflux).     [name] oxybutynin (DITROPAN) 5 MG tablet Take 1 tablet (5 mg total) by mouth 2 (two) times daily. 180 tablet 1  . polyethylene glycol powder (GLYCOLAX/MIRALAX) powder Take 17-34 g by mouth 2 (two) times daily as needed for moderate constipation. 850 g 5  . potassium chloride SA (K-DUR,KLOR-CON) 20 MEQ tablet Take 1 tablet (20 mEq total) by mouth daily. 90 tablet 2  . temazepam (RESTORIL) 30 MG capsule Take 1 capsule (30 mg total) by mouth at bedtime. 30 capsule 5  . vitamin C (ASCORBIC ACID) 500 MG tablet Take 500 mg by mouth daily.    [name] zinc sulfate (ZINC-220) 220 (50 Zn) MG capsule Take 220 mg by mouth daily.     No facility-administered medications prior to visit.     ROS: Review of Systems  Constitutional: Negative for activity change, appetite change, chills, fatigue and unexpected weight change.  HENT: Negative for congestion, mouth sores and sinus pressure.   Eyes: Negative for visual disturbance.  Respiratory: Negative for cough and chest tightness.   Gastrointestinal: Negative for abdominal pain and nausea.  Genitourinary: Negative for difficulty urinating, frequency and vaginal pain.  Musculoskeletal: Positive for arthralgias, back pain and gait problem.  Skin: Negative for pallor  and rash.  Neurological: Negative for dizziness, tremors, weakness, numbness and headaches.  Psychiatric/Behavioral: Negative for confusion, dysphoric mood, sleep disturbance and suicidal ideas. The patient is nervous/anxious.     Objective:  BP 136/76 (BP Location: Right Arm, Patient Position: Sitting, Cuff Size: Normal)   Pulse 61   Temp 98 F (36.7 C) (Oral)   Ht 5\' 5"  (1.651 m)   Wt 182 lb (82.6 kg)   SpO2 97%   BMI 30.29 kg/m   BP Readings from Last 3 Encounters:  03/10/18 136/76  12/03/17 136/74  10/29/17 (!) 148/80    Wt Readings from Last 3 Encounters:  03/10/18 182 lb (82.6 kg)  12/03/17 181 lb (82.1 kg)  10/29/17 176 lb 6.4 oz (80 kg)     Physical Exam Constitutional:      General: She is not in acute distress.    Appearance: She is well-developed.  HENT:     Head: Normocephalic.     Right Ear: External ear normal.     Left Ear: External ear normal.     Nose: Nose normal.  Eyes:     General:        Right eye: No discharge.        Left eye: No discharge.     Conjunctiva/sclera: Conjunctivae normal.     Pupils: Pupils are equal, round, and reactive to light.  Neck:     Musculoskeletal: Normal range of motion and neck supple.     Thyroid: No thyromegaly.     Vascular: No JVD.     Trachea: No tracheal deviation.  Cardiovascular:     Rate and Rhythm: Normal rate and regular rhythm.     Heart sounds: Normal heart sounds.  Pulmonary:     Effort: No respiratory distress.     Breath sounds: No stridor. No wheezing.  Abdominal:     General: Bowel sounds are normal. There is no distension.     Palpations: Abdomen is soft. There is no mass.     Tenderness: There is no abdominal tenderness. There is no guarding or rebound.  Musculoskeletal:        General: Tenderness present.  Lymphadenopathy:     Cervical: No cervical adenopathy.  Skin:    Findings: No erythema or rash.  Neurological:     Cranial Nerves: No cranial nerve deficit.     Motor: No abnormal muscle tone.     Coordination: Coordination abnormal.     Deep Tendon Reflexes: Reflexes normal.  Psychiatric:        Behavior: Behavior normal.        Thought Content: Thought content normal.        Judgment: Judgment normal.   ataxic, walker Pain w/ROM - lower spine  Lab Results  Component Value Date   WBC 4.8 07/28/2017   HGB 12.8 07/28/2017   HCT 38.5 07/28/2017   PLT 196 07/28/2017   GLUCOSE 104 (H) 12/03/2017   CHOL 116 06/25/2012   TRIG 82 09/05/2013   HDL 41.50 06/25/2012   LDLDIRECT 110.3 05/24/2008   LDLCALC 66 06/25/2012   ALT 20 07/28/2017   AST 22 07/28/2017   NA 137 12/03/2017   K 3.7 12/03/2017   CL 99 12/03/2017   CREATININE  0.99 12/03/2017   BUN 11 12/03/2017   CO2 30 12/03/2017   TSH 0.65 05/20/2017   INR 1.33 05/23/2017   HGBA1C 6.1 (H) 09/08/2013    Dg Chest 2 View  Result Date: 06/13/2017 CLINICAL DATA:  Followup  left lung pneumonia. EXAM: CHEST - 2 VIEW COMPARISON:  05/23/2017 FINDINGS: Mild cardiac enlargement. Aortic atherosclerosis. No pleural effusions or edema. No airspace opacities. Spondylosis noted throughout the thoracic spine. IMPRESSION: Resolution of previous left lower lobe pneumonia. Aortic Atherosclerosis (ICD10-I70.0). Electronically Signed   By: Kerby Moors M.D.   On: 06/13/2017 11:51    Assessment & Plan:   There are no diagnoses linked to this encounter.   No orders of the defined types were placed in this encounter.    Follow-up: No follow-ups on file.  Walker Kehr, MD

## 2018-03-10 NOTE — Assessment & Plan Note (Signed)
Metoprolol, Diltiazem

## 2018-03-10 NOTE — Addendum Note (Signed)
Addended by: Karren Cobble on: 03/10/2018 02:59 PM   Modules accepted: Orders

## 2018-03-10 NOTE — Assessment & Plan Note (Signed)
On Eliquis

## 2018-04-26 ENCOUNTER — Telehealth: Payer: Self-pay | Admitting: Internal Medicine

## 2018-04-26 MED ORDER — METOPROLOL SUCCINATE ER 50 MG PO TB24: 50.0000 mg | ORAL_TABLET | Freq: Two times a day (BID) | ORAL | 1 refills | Status: DC

## 2018-04-26 NOTE — Telephone Encounter (Signed)
erx has been sent to [facility]

## 2018-04-26 NOTE — Telephone Encounter (Signed)
Copied from [place] [phone]. Topic: Quick Communication - Rx Refill/Question >> Apr 26, 2018 11:08 AM Alanda Slim E wrote: Medication: metoprolol succinate (TOPROL-XL) 50 MG 24 hr tablet  Has the patient contacted their pharmacy? Yes - pharmacy called it in  Preferred Pharmacy (with phone number or street name): [facility], [place] - [place] [phone] (Phone) [phone] (Fax)    Agent: Please be advised that RX refills may take up to 3 business days. We ask that you follow-up with your pharmacy.

## 2018-05-10 ENCOUNTER — Ambulatory Visit: Payer: Medicare Other | Admitting: Cardiovascular Disease

## 2018-05-13 ENCOUNTER — Other Ambulatory Visit: Payer: Self-pay | Admitting: Internal Medicine

## 2018-05-13 NOTE — Telephone Encounter (Signed)
Requested medication (s) are due for refill today: yes  Requested medication (s) are on the active medication list: yes  Last refill:  11/18/17  Future visit scheduled:yes  Notes to clinic:  Not delegated    Requested Prescriptions  Pending Prescriptions Disp Refills   temazepam (RESTORIL) 30 MG capsule 30 capsule 5    Sig: Take 1 capsule (30 mg total) by mouth at bedtime.     Not Delegated - Psychiatry:  Anxiolytics/Hypnotics Failed - 05/13/2018  1:41 PM      Failed - This refill cannot be delegated      Failed - Urine Drug Screen completed in last 360 days.      Passed - Valid encounter within last 6 months    Recent Outpatient Visits          2 months ago Other osteoporosis without current pathological fracture   [place], [name], MD   5 months ago Essential hypertension   [place] [name], [name], MD   8 months ago Atrial flutter, unspecified type [facility])   [place], MD   11 months ago Community acquired pneumonia of left lung, unspecified part of lung   [place], MD   11 months ago Other iron deficiency anemia   [place], MD      Future Appointments            In 1 month [name], [name], MD [place], [place]

## 2018-05-17 NOTE — Telephone Encounter (Signed)
Called checking status patient is out

## 2018-05-18 ENCOUNTER — Other Ambulatory Visit: Payer: Self-pay | Admitting: Internal Medicine

## 2018-05-18 MED ORDER — TEMAZEPAM 30 MG PO CAPS: 30.0000 mg | ORAL_CAPSULE | Freq: Every day | ORAL | 5 refills | Status: DC

## 2018-05-18 NOTE — Telephone Encounter (Signed)
RX faxed

## 2018-05-18 NOTE — Addendum Note (Signed)
Addended by: Karren Cobble on: 05/18/2018 02:02 PM   Modules accepted: Orders

## 2018-05-21 ENCOUNTER — Telehealth: Payer: Medicare Other | Admitting: Cardiovascular Disease

## 2018-05-27 ENCOUNTER — Telehealth: Payer: Self-pay | Admitting: Internal Medicine

## 2018-05-27 MED ORDER — ATORVASTATIN CALCIUM 10 MG PO TABS: 10.0000 mg | ORAL_TABLET | Freq: Every evening | ORAL | 1 refills | Status: DC

## 2018-05-27 NOTE — Telephone Encounter (Signed)
Copied from [place] [phone]. Topic: Quick Communication - Rx Refill/Question >> May 27, 2018 10:26 AM Reyne Dumas L wrote: Medication: atorvastatin (LIPITOR) 10 MG tablet  Pt's care facility calling to request refill of medication  Preferred Pharmacy (with phone number or street name): [facility], [place] - [place] [phone] (Phone) [phone] (Fax)  Agent: Please be advised that RX refills may take up to 3 business days. We ask that you follow-up with your pharmacy.

## 2018-05-27 NOTE — Telephone Encounter (Signed)
RX sent

## 2018-06-17 ENCOUNTER — Ambulatory Visit: Payer: Medicare Other | Admitting: Internal Medicine

## 2018-07-13 ENCOUNTER — Telehealth: Payer: Self-pay | Admitting: Internal Medicine

## 2018-07-13 NOTE — Telephone Encounter (Signed)
Medication Refill - Medication: oxybutynin (DITROPAN) 5 MG tablet  Refill request  Preferred Pharmacy (with phone number or street name):  [facility], [place] - [place] [phone] (Phone) [phone] (Fax)

## 2018-07-14 ENCOUNTER — Encounter: Payer: Self-pay | Admitting: Internal Medicine

## 2018-07-14 ENCOUNTER — Other Ambulatory Visit (INDEPENDENT_AMBULATORY_CARE_PROVIDER_SITE_OTHER): Payer: Medicare Other

## 2018-07-14 ENCOUNTER — Ambulatory Visit (INDEPENDENT_AMBULATORY_CARE_PROVIDER_SITE_OTHER): Payer: Medicare Other | Admitting: Internal Medicine

## 2018-07-14 ENCOUNTER — Other Ambulatory Visit: Payer: Self-pay

## 2018-07-14 VITALS — BP 128/72 | HR 68 | Temp 97.9°F | Ht 65.0 in | Wt 175.0 lb

## 2018-07-14 DIAGNOSIS — I10 Essential (primary) hypertension: Secondary | ICD-10-CM

## 2018-07-14 LAB — BASIC METABOLIC PANEL
BUN: 18 mg/dL (ref 6–23)
CO2: 29 mEq/L (ref 19–32)
Calcium: 10 mg/dL (ref 8.4–10.5)
Chloride: 99 mEq/L (ref 96–112)
Creatinine, Ser: 1.01 mg/dL (ref 0.40–1.20)
GFR: 51.41 mL/min — ABNORMAL LOW (ref >60.00)
Glucose, Bld: 119 mg/dL — ABNORMAL HIGH (ref 70–99)
Potassium: 4.1 mEq/L (ref 3.5–5.1)
Sodium: 137 mEq/L (ref 135–145)

## 2018-07-14 MED ORDER — OXYBUTYNIN CHLORIDE 5 MG PO TABS: 5.0000 mg | ORAL_TABLET | Freq: Two times a day (BID) | ORAL | 1 refills | Status: DC

## 2018-07-14 NOTE — Progress Notes (Signed)
Subjective:  Patient ID: [name], female    DOB: [birth year]  Age: 85 y.o. MRN: [id number]  CC: No chief complaint on file.   HPI [name] presents for GERD, LBP, dyslipidemia f/u  Outpatient Medications Prior to Visit  Medication Sig Dispense Refill  . acetaminophen (TYLENOL) 500 MG tablet Take 500 mg by mouth daily as needed for mild pain.     [name] anastrozole (ARIMIDEX) 1 MG tablet Take 1 tablet (1 mg total) by mouth daily. 90 tablet 3  . apixaban (ELIQUIS) 5 MG TABS tablet Take 1 tablet (5 mg total) by mouth 2 (two) times daily. 180 tablet 3  . atorvastatin (LIPITOR) 10 MG tablet Take 1 tablet (10 mg total) by mouth every evening. 90 tablet 1  . B Complex-C (B-COMPLEX WITH VITAMIN C) tablet Take 1 tablet by mouth daily.    . busPIRone (BUSPAR) 7.5 MG tablet Take 1 tablet (7.5 mg total) by mouth 2 (two) times daily. 180 tablet 1  . Cholecalciferol (VITAMIN D3) 1000 UNITS tablet Take 1,000 Units by mouth daily.      [name] diltiazem (CARDIZEM CD) 120 MG 24 hr capsule TAKE 1 CAPSULE (120 MG TOTAL) BY MOUTH DAILY. 90 capsule 1  . doxycycline (VIBRA-TABS) 100 MG tablet Take 1 tablet (100 mg total) by mouth 2 (two) times daily. 60 tablet 11  . doxycycline (VIBRAMYCIN) 100 MG capsule Take 100 mg by mouth 2 (two) times daily.  10  . furosemide (LASIX) 40 MG tablet Take 1 tablet (40 mg total) by mouth as directed. 1 po on Mon-Wed-Fri in am 90 tablet 1  . levothyroxine (SYNTHROID, LEVOTHROID) 25 MCG tablet TAKE 1 TABLET BY MOUTH ONCE DAILY FOR THYROID 90 tablet 1  . linaclotide (LINZESS) 290 MCG CAPS capsule Take 1 capsule (290 mcg total) by mouth daily. 90 capsule 3  . metoprolol succinate (TOPROL-XL) 50 MG 24 hr tablet Take 1 tablet (50 mg total) by mouth 2 (two) times daily. Take with or immediately following a meal. 180 tablet 1  . Multiple Vitamins-Minerals (MULTIVITAMIN WITH MINERALS) tablet Take 1 tablet by mouth daily.    [name] omeprazole (PRILOSEC) 20 MG capsule Take 20 mg by mouth  daily as needed (acid reflux).     [name] oxybutynin (DITROPAN) 5 MG tablet Take 1 tablet (5 mg total) by mouth 2 (two) times daily. 180 tablet 1  . polyethylene glycol powder (GLYCOLAX/MIRALAX) powder Take 17-34 g by mouth 2 (two) times daily as needed for moderate constipation. 850 g 5  . potassium chloride SA (K-DUR,KLOR-CON) 20 MEQ tablet Take 1 tablet (20 mEq total) by mouth daily. 90 tablet 2  . temazepam (RESTORIL) 30 MG capsule Take 1 capsule (30 mg total) by mouth at bedtime. 30 capsule 5  . vitamin C (ASCORBIC ACID) 500 MG tablet Take 500 mg by mouth daily.    [name] zinc sulfate (ZINC-220) 220 (50 Zn) MG capsule Take 220 mg by mouth daily.     No facility-administered medications prior to visit.     ROS: Review of Systems  Constitutional: Negative for activity change, appetite change, chills, diaphoresis, fatigue, fever and unexpected weight change.  HENT: Negative for congestion, dental problem, ear pain, hearing loss, mouth sores, postnasal drip, sinus pressure, sneezing, sore throat and voice change.   Eyes: Negative for pain and visual disturbance.  Respiratory: Negative for cough, chest tightness, wheezing and stridor.   Cardiovascular: Negative for chest pain, palpitations and leg swelling.  Gastrointestinal: Negative for abdominal distention,  abdominal pain, blood in stool, nausea, rectal pain and vomiting.  Genitourinary: Positive for frequency and urgency. Negative for difficulty urinating, dysuria, hematuria, vaginal bleeding, vaginal discharge and vaginal pain.  Musculoskeletal: Positive for arthralgias and back pain. Negative for gait problem, joint swelling and neck pain.  Skin: Negative for color change, rash and wound.  Neurological: Negative for dizziness, tremors, syncope, speech difficulty, weakness and light-headedness.  Hematological: Negative for adenopathy.  Psychiatric/Behavioral: Negative for behavioral problems, confusion, decreased concentration, dysphoric mood,  hallucinations, sleep disturbance and suicidal ideas. The patient is not nervous/anxious and is not hyperactive.     Objective:  BP 128/72 (BP Location: Left Arm, Patient Position: Sitting, Cuff Size: Normal)   Pulse 68   Temp 97.9 F (36.6 C) (Oral)   Ht 5\' 5"  (1.651 m)   Wt 175 lb (79.4 kg)   SpO2 99%   BMI 29.12 kg/m   BP Readings from Last 3 Encounters:  07/14/18 128/72  03/10/18 136/76  12/03/17 136/74    Wt Readings from Last 3 Encounters:  07/14/18 175 lb (79.4 kg)  03/10/18 182 lb (82.6 kg)  12/03/17 181 lb (82.1 kg)    Physical Exam Constitutional:      General: She is not in acute distress.    Appearance: She is well-developed.  HENT:     Head: Normocephalic.     Right Ear: External ear normal.     Left Ear: External ear normal.     Nose: Nose normal.  Eyes:     General:        Right eye: No discharge.        Left eye: No discharge.     Conjunctiva/sclera: Conjunctivae normal.     Pupils: Pupils are equal, round, and reactive to light.  Neck:     Musculoskeletal: Normal range of motion and neck supple.     Thyroid: No thyromegaly.     Vascular: No JVD.     Trachea: No tracheal deviation.  Cardiovascular:     Rate and Rhythm: Normal rate and regular rhythm.     Heart sounds: Normal heart sounds.  Pulmonary:     Effort: No respiratory distress.     Breath sounds: No stridor. No wheezing.  Abdominal:     General: Bowel sounds are normal. There is no distension.     Palpations: Abdomen is soft. There is no mass.     Tenderness: There is no abdominal tenderness. There is no guarding or rebound.  Musculoskeletal:        General: No tenderness.  Lymphadenopathy:     Cervical: No cervical adenopathy.  Skin:    Findings: No erythema or rash.  Neurological:     Mental Status: She is oriented to person, place, and time.     Cranial Nerves: No cranial nerve deficit.     Motor: No abnormal muscle tone.     Coordination: Coordination abnormal.     Gait:  Gait abnormal.     Deep Tendon Reflexes: Reflexes normal.  Psychiatric:        Behavior: Behavior normal.        Thought Content: Thought content normal.        Judgment: Judgment normal.    Walker LS stiff   Lab Results  Component Value Date   WBC 4.8 07/28/2017   HGB 12.8 07/28/2017   HCT 38.5 07/28/2017   PLT 196 07/28/2017   GLUCOSE 104 (H) 12/03/2017   CHOL 116 06/25/2012   TRIG 82  09/05/2013   HDL 41.50 06/25/2012   LDLDIRECT 110.3 05/24/2008   LDLCALC 66 06/25/2012   ALT 20 07/28/2017   AST 22 07/28/2017   NA 137 12/03/2017   K 3.7 12/03/2017   CL 99 12/03/2017   CREATININE 0.99 12/03/2017   BUN 11 12/03/2017   CO2 30 12/03/2017   TSH 0.65 05/20/2017   INR 1.33 05/23/2017   HGBA1C 6.1 (H) 09/08/2013    Dg Chest 2 View  Result Date: 06/13/2017 CLINICAL DATA:  Followup left lung pneumonia. EXAM: CHEST - 2 VIEW COMPARISON:  05/23/2017 FINDINGS: Mild cardiac enlargement. Aortic atherosclerosis. No pleural effusions or edema. No airspace opacities. Spondylosis noted throughout the thoracic spine. IMPRESSION: Resolution of previous left lower lobe pneumonia. Aortic Atherosclerosis (ICD10-I70.0). Electronically Signed   By: Kerby Moors M.D.   On: 06/13/2017 11:51    Assessment & Plan:   There are no diagnoses linked to this encounter.   No orders of the defined types were placed in this encounter.    Follow-up: No follow-ups on file.  Walker Kehr, MD

## 2018-07-14 NOTE — Telephone Encounter (Signed)
RX sent

## 2018-07-15 ENCOUNTER — Telehealth: Payer: Self-pay | Admitting: Internal Medicine

## 2018-07-15 ENCOUNTER — Ambulatory Visit: Payer: Medicare Other | Admitting: Internal Medicine

## 2018-07-15 MED ORDER — DILTIAZEM HCL ER COATED BEADS 120 MG PO CP24: ORAL_CAPSULE | ORAL | 1 refills | Status: DC

## 2018-07-15 NOTE — Telephone Encounter (Signed)
RX sent

## 2018-07-15 NOTE — Telephone Encounter (Signed)
Medication Refill - Medication: diltiazem (CARDIZEM CD) 120 MG 24 hr capsule   Has the patient contacted their pharmacy? Yes.   (Agent: If no, request that the patient contact the pharmacy for the refill.) (Agent: If yes, when and what did the pharmacy advise?)  Preferred Pharmacy (with phone number or street name):  [facility], [place] - [place] [phone] (Phone) [phone] (Fax)     Agent: Please be advised that RX refills may take up to 3 business days. We ask that you follow-up with your pharmacy.

## 2018-07-27 ENCOUNTER — Other Ambulatory Visit: Payer: Self-pay | Admitting: *Deleted

## 2018-08-02 ENCOUNTER — Telehealth: Payer: Self-pay | Admitting: Internal Medicine

## 2018-08-02 NOTE — Telephone Encounter (Signed)
Medication Refill - Medication: potassium chloride SA (K-DUR,KLOR-CON) 20 MEQ tablet   Has the patient contacted their pharmacy? Yes.   (Agent: If no, request that the patient contact the pharmacy for the refill.) (Agent: If yes, when and what did the pharmacy advise?)  Preferred Pharmacy (with phone number or street name):  [facility], [address]  [place] [address]  Phone: [phone] Fax: [phone]  Not a 24 hour pharmacy; exact hours not known.     Agent: Please be advised that RX refills may take up to 3 business days. We ask that you follow-up with your pharmacy.

## 2018-08-03 MED ORDER — POTASSIUM CHLORIDE CRYS ER 20 MEQ PO TBCR: 20.0000 meq | EXTENDED_RELEASE_TABLET | Freq: Every day | ORAL | 2 refills | Status: DC

## 2018-08-03 NOTE — Telephone Encounter (Signed)
Rx faxed

## 2018-08-05 ENCOUNTER — Telehealth: Payer: Self-pay | Admitting: Internal Medicine

## 2018-08-05 MED ORDER — DOXYCYCLINE HYCLATE 100 MG PO TABS: 100.0000 mg | ORAL_TABLET | Freq: Two times a day (BID) | ORAL | 11 refills | Status: DC

## 2018-08-05 NOTE — Telephone Encounter (Signed)
RX faxed

## 2018-08-05 NOTE — Telephone Encounter (Signed)
doxycycline (VIBRA-TABS) 100 MG tablet    Sent to [facility]

## 2018-08-09 ENCOUNTER — Telehealth: Payer: Self-pay | Admitting: Internal Medicine

## 2018-08-09 MED ORDER — LEVOTHYROXINE SODIUM 25 MCG PO TABS: ORAL_TABLET | ORAL | 1 refills | Status: DC

## 2018-08-09 NOTE — Telephone Encounter (Signed)
Refill request for levothyroxine (SYNTHROID, LEVOTHROID) 25 MCG tablet    Pharmacy: [facility], [place]

## 2018-08-10 ENCOUNTER — Encounter: Payer: Self-pay | Admitting: Physician Assistant

## 2018-08-10 ENCOUNTER — Telehealth: Payer: Self-pay | Admitting: Internal Medicine

## 2018-08-10 MED ORDER — BUSPIRONE HCL 7.5 MG PO TABS: 7.5000 mg | ORAL_TABLET | Freq: Two times a day (BID) | ORAL | 1 refills | Status: DC

## 2018-08-10 MED ORDER — LEVOTHYROXINE SODIUM 25 MCG PO TABS: ORAL_TABLET | ORAL | 1 refills | Status: DC

## 2018-08-10 NOTE — Telephone Encounter (Signed)
Copied from [place] [phone]. Topic: Quick Communication - Rx Refill/Question >> Aug 10, 2018  1:15 PM Nils Flack, [name] wrote: Medication: busPIRone (BUSPAR) 7.5 MG tablet  Has the patient contacted their pharmacy? Yes.   (Agent: If no, request that the patient contact the pharmacy for the refill.) (Agent: If yes, when and what did the pharmacy advise?)  Preferred Pharmacy (with phone number [facility] street [facility]  Agent: Please be advised that RX refills may take up to 3 business days. We ask that you follow-up with your pharmacy.

## 2018-08-10 NOTE — Progress Notes (Signed)
Cardiology Office Note    Date:  08/11/2018  ID:  LENKA ZHAO, DOB September 17, 1925, MRN [id number] PCP:  [name], MD  Cardiologist:  Lauree Chandler, MD   Chief Complaint: f/u AFib  History of Present Illness:  [name] is a 85 y.o. female with history of essential HTN, persistent atrial fibrillation/flutter, chronic diastolic CHF, RBBB, chronic low back pain, iron deficiency anemia, diverticulosis, breast cancer, GERD, hiatal hernia, remote ischemic colitis, remote UGB, remote stasis ulcer, HLD, OA who presents for 6 month f/u. She remotely had melena/anemia while on Coumadin with endoscopy showing gastric ulcers. Those were treated and she was switched to Eliquis and has since done relatively well. She has chronic lower ext edema controlled with Lasix. She has left breast cancer and has had lumpectomy in July 2017. Last echo 04/2016 showed mild LVH, EF 55%, trivial MR, severe LAE, mildly dilated RV with normal function. Regarding her atrial fib, she has been treated with rate control strategy. Last labs 06/2018 showed K 4.1, glucose 119, Cr 1.01, 07/2017 Hgb 12.8.  She returns for follow-up today doing well from cardiac standpoint. She remains unaware of her atrial fib. Rate is controlled. No recent falls. She has walked with a walker for some time. She has been somewhat depressed as husband is living in separate area of [place] due to his dementia - she finally gets to see him this Friday. She lives in [place] and is hopeful he will be released to come back and stay with her. No bleeding. She ran out of Lasix and wonders if she needs to resume it. She has very mild edema but states it's actually better than it has been in the past.   Past Medical History:  Diagnosis Date  . Anxiety    denies  . Atrial flutter ([place])   . Bilateral edema of lower extremity   . Breast cancer ([place])    left breast  . Chronic constipation   . Chronic diastolic CHF (congestive heart failure)  ([place])   . Diverticulosis of colon   . GERD (gastroesophageal reflux disease)   . H/O hiatal hernia   . History of cellulitis    LEFT LOWER LEG --  SEPT 2015  . History of colitis    DX  2003  WITH ISCHEMIC COLITIS  . History of diverticulitis of colon    perferated diverticulitis w/ abscess 08-25-2013 drain placed --  resolved without surgical intervention  . History of GI bleed    2011--- UPPER SECONARY GASTRIC ULCER FROM NSAID USE  . History of Helicobacter pylori infection    2011  . History of ulcer of lower limb    2012   RIGHT LOWER EXTREMITIY--  STATSIS ULCER  . Hyperlipidemia   . Hypertension   . Hypothyroidism   . Iron deficiency anemia 2011   elevated MCV  . LBP (low back pain)    severe lumbar spondylosis s/p L3/L4,L4/L5 fusion 12/10  . Lumbar spondylolysis   . OA (osteoarthritis)    "hands" (08/25/2013)  . Open wound of left lower leg   . Osteopenia   . Persistent atrial fibrillation    CARDIOLOGIST-   DR Burman Foster  . RBBB   . Urinary frequency     Past Surgical History:  Procedure Laterality Date  . APPLICATION OF A-CELL OF EXTREMITY Left 06/21/2013   Procedure: APPLICATION OF A-CELL OF EXTREMITY;  Surgeon: Theodoro Kos, DO;  Location: [facility];  Service: Plastics;  Laterality: Left;  .  APPLICATION OF A-CELL OF EXTREMITY Left 08/22/2013   Procedure: APPLICATION OF A-CELL/VAC TO LOWER LEFT LEG WOUND;  Surgeon: Theodoro Kos, DO;  Location: [facility];  Service: Plastics;  Laterality: Left;  . APPLICATION OF A-CELL OF EXTREMITY Left 02/06/2014   Procedure: WITH PLACEMENT OF A -CELL ;  Surgeon: Theodoro Kos, DO;  Location: [facility];  Service: Plastics;  Laterality: Left;  . APPLICATION OF A-CELL OF EXTREMITY Left 08/09/2014   Procedure: PLACEMENT OF A CELL;  Surgeon: Theodoro Kos, DO;  Location: [facility];  Service: Plastics;  Laterality: Left;  . APPLICATION OF WOUND VAC Left 06/16/2013   Procedure: APPLICATION OF WOUND VAC;  Surgeon: Meredith Pel, MD;  Location: [facility];  Service: Orthopedics;  Laterality: Left;  . APPLICATION OF WOUND VAC Left 06/21/2013   Procedure: APPLICATION OF WOUND VAC;  Surgeon: Theodoro Kos, DO;  Location: [facility];  Service: Plastics;  Laterality: Left;  . BIOPSY BREAST  07/25/2015   LEFT RADIOACTIVE SEED GUIDED EXCISIONAL BREAST BIOPSY (Left) as a surgical intervention  . EXTERNAL FIXATION LEG Left 06/16/2013   Procedure: EXTERNAL FIXATION LEG;  Surgeon: Meredith Pel, MD;  Location: [facility];  Service: Orthopedics;  Laterality: Left;  . EXTERNAL FIXATION REMOVAL Left 06/21/2013   Procedure: REMOVAL EXTERNAL FIXATION LEG;  Surgeon: Meredith Pel, MD;  Location: [facility];  Service: Orthopedics;  Laterality: Left;  . EYE SURGERY Bilateral    cataract removal  . HARDWARE REMOVAL Left 05/23/2014   Procedure: HARDWARE REMOVAL;  Surgeon: Meredith Pel, MD;  Location: [facility];  Service: Orthopedics;  Laterality: Left;  REMOVAL OF HARDWARE LEFT TIBIA  . I&D EXTREMITY Left 06/16/2013   Procedure: IRRIGATION AND DEBRIDEMENT EXTREMITY;  Surgeon: Meredith Pel, MD;  Location: [facility];  Service: Orthopedics;  Laterality: Left;  . I&D EXTREMITY Left 02/06/2014   Procedure: IRRIGATION AND DEBRIDEMENT LEFT LEG WOUND ;  Surgeon: Theodoro Kos, DO;  Location: [facility];  Service: Plastics;  Laterality: Left;  . I&D EXTREMITY Left 08/09/2014   Procedure: IRRIGATION AND DEBRIDEMENT  OF LEFT LOWER LEG;  Surgeon: Theodoro Kos, DO;  Location: [facility];  Service: Plastics;  Laterality: Left;  . LUMBAR FUSION  01-02-2009   L3-L5  . RADIOACTIVE SEED GUIDED EXCISIONAL BREAST BIOPSY Left 07/25/2015   Procedure: LEFT RADIOACTIVE SEED GUIDED EXCISIONAL BREAST BIOPSY;  Surgeon: Rolm Bookbinder, MD;  Location: [facility];  Service: General;  Laterality: Left;  . RE-EXCISION OF BREAST LUMPECTOMY Left 09/05/2015   Procedure: RE-EXCISION OF LEFT BREAST LUMPECTOMY;  Surgeon: Rolm Bookbinder, MD;  Location: [facility];  Service:  General;  Laterality: Left;  . SHOULDER OPEN ROTATOR CUFF REPAIR Left 1997  . TIBIA IM NAIL INSERTION Left 06/21/2013   Procedure: INTRAMEDULLARY (IM) NAIL TIBIAL;  Surgeon: Meredith Pel, MD;  Location: [facility];  Service: Orthopedics;  Laterality: Left;  . TONSILLECTOMY  AS CHILD  . TOTAL HIP ARTHROPLASTY Right 06-07-2010  . TRANSTHORACIC ECHOCARDIOGRAM  09-11-2010   DR MCALHANY   LVSF  55-60%/  MILD MV CALCIFICATION WITH NO STENOSIS/  MILD MR & TR / MODERATE LAE/  MODERATE TO SEVERE RAE  . UPPER GI ENDOSCOPY  03-29-2010    Current Medications: Current Meds  Medication Sig  . acetaminophen (TYLENOL) 500 MG tablet Take 500 mg by mouth daily as needed for mild pain.   [name] anastrozole (ARIMIDEX) 1 MG tablet Take 1 tablet (1 mg total) by mouth daily.  [name] apixaban (ELIQUIS) 5  MG TABS tablet Take 1 tablet (5 mg total) by mouth 2 (two) times daily.  [name] atorvastatin (LIPITOR) 10 MG tablet Take 1 tablet (10 mg total) by mouth every evening.  . B Complex-C (B-COMPLEX WITH VITAMIN C) tablet Take 1 tablet by mouth daily.  . busPIRone (BUSPAR) 7.5 MG tablet Take 1 tablet (7.5 mg total) by mouth 2 (two) times daily.  . Cholecalciferol (VITAMIN D3) 1000 UNITS tablet Take 1,000 Units by mouth daily.    [name] diltiazem (CARDIZEM CD) 120 MG 24 hr capsule TAKE 1 CAPSULE (120 MG TOTAL) BY MOUTH DAILY.  [name] doxycycline (VIBRA-TABS) 100 MG tablet Take 1 tablet (100 mg total) by mouth 2 (two) times daily.  . furosemide (LASIX) 40 MG tablet Take 40 mg by mouth daily as needed.  [name] levothyroxine (SYNTHROID) 25 MCG tablet TAKE 1 TABLET BY MOUTH ONCE DAILY FOR THYROID  . linaclotide (LINZESS) 290 MCG CAPS capsule Take 1 capsule (290 mcg total) by mouth daily.  . metoprolol succinate (TOPROL-XL) 50 MG 24 hr tablet Take 1 tablet (50 mg total) by mouth 2 (two) times daily. Take with or immediately following a meal.  . Multiple Vitamins-Minerals (MULTIVITAMIN WITH MINERALS) tablet Take 1 tablet by mouth daily.  [name] omeprazole  (PRILOSEC) 20 MG capsule Take 20 mg by mouth daily as needed (acid reflux).   [name] oxybutynin (DITROPAN) 5 MG tablet Take 1 tablet (5 mg total) by mouth 2 (two) times daily.  . polyethylene glycol powder (GLYCOLAX/MIRALAX) powder Take 17-34 g by mouth 2 (two) times daily as needed for moderate constipation.  . potassium chloride SA (K-DUR) 20 MEQ tablet Take 1 tablet (20 mEq total) by mouth daily.  . temazepam (RESTORIL) 30 MG capsule Take 1 capsule (30 mg total) by mouth at bedtime.  . vitamin C (ASCORBIC ACID) 500 MG tablet Take 500 mg by mouth daily.  [name] zinc sulfate (ZINC-220) 220 (50 Zn) MG capsule Take 220 mg by mouth daily.  . [DISCONTINUED] furosemide (LASIX) 40 MG tablet Take 1 tablet (40 mg total) by mouth as directed. 1 po on Mon-Wed-Fri in am      Allergies:   Zosyn [piperacillin sod-tazobactam so], Atenolol, Clarithromycin, Codeine sulfate, Levaquin [levofloxacin], Macrodantin, Oxycodone-acetaminophen, and Percocet [oxycodone-acetaminophen]   Social History   Socioeconomic History  . Marital status: Married    Spouse name: Not on file  . Number of children: 2  . Years of education: Not on file  . Highest education level: Not on file  Occupational History  . Occupation: retired    Fish farm manager: UNEMPLOYED  Social Needs  . Financial resource strain: Not on file  . Food insecurity    Worry: Not on file    Inability: Not on file  . Transportation needs    Medical: Not on file    Non-medical: Not on file  Tobacco Use  . Smoking status: Never Smoker  . Smokeless tobacco: Never Used  Substance and Sexual Activity  . Alcohol use: No    Comment: 1 glass occ wine 08/03/14- none in a year  . Drug use: No  . Sexual activity: Not on file  Lifestyle  . Physical activity    Days per week: Not on file    Minutes per session: Not on file  . Stress: Not on file  Relationships  . Social Herbalist on phone: Not on file    Gets together: Not on file    Attends religious  service: Not on file  Active member of club or organization: Not on file    Attends meetings of clubs or organizations: Not on file    Relationship status: Not on file  Other Topics Concern  . Not on file  Social History Narrative  . Not on file     Family History:  The patient's family history includes Cancer in her father; Hypertension in an other family member.  ROS:   Please see the history of present illness.   All other systems are reviewed and otherwise negative.    PHYSICAL EXAM:   VS:  BP 122/60   Pulse 76   Ht 5\' 5"  (1.651 m)   Wt 170 lb (77.1 kg)   SpO2 97%   BMI 28.29 kg/m   BMI: Body mass index is 28.29 kg/m. GEN: Well nourished, well developed WF, in no acute distress HEENT: normocephalic, atraumatic Neck: no JVD, carotid bruits, or masses Cardiac: irregularly irregular, rate controlled, no murmurs, rubs, or gallops, trace BLE edema with chronic skin thickening - nonpitting Respiratory:  clear to auscultation bilaterally, normal work of breathing GI: soft, nontender, nondistended, + BS MS: no deformity or atrophy Skin: warm and dry, no rash Neuro:  Alert and Oriented x 3, Strength and sensation are intact, follows commands Psych: euthymic mood, full affect  Wt Readings from Last 3 Encounters:  08/11/18 170 lb (77.1 kg)  07/14/18 175 lb (79.4 kg)  03/10/18 182 lb (82.6 kg)      Studies/Labs Reviewed:   EKG:  EKG was ordered today and personally reviewed by me and demonstrates atrial fibrillation 67bpm, RBBB, nonspecific STT changes similar to prior except new Q wave in III  Recent Labs: 07/14/2018: BUN 18; Creatinine, Ser 1.01; Potassium 4.1; Sodium 137   Lipid Panel    Component Value Date/Time   CHOL 116 06/25/2012 1619   TRIG 82 09/05/2013 0300   HDL 41.50 06/25/2012 1619   CHOLHDL 3 06/25/2012 1619   VLDL 8.2 06/25/2012 1619   LDLCALC 66 06/25/2012 1619   LDLDIRECT 110.3 05/24/2008 1041    Additional studies/ records that were reviewed  today include: Summarized above   ASSESSMENT & PLAN:   1. Persistent atrial fib/flutter - rate controlled, no symptoms. Eliquis dose remains appropriate for age, weight and last Cr. She is losing weight, however, and so will need to pay close attention to total body kg at each successive OV. She feels some of this weight loss is related to the depression over her husband. I asked her to please reach out to her PCP to discuss community resources to get her through this strange time as the pandemic has affected her usual social resources. She inquired about samples/assistance and was provided information on next steps today. She is to let us know if it becomes unaffordable for her. F/u CBC today. 2. History of anemia - due for CBC, will obtain today. No bleeding reported. 3. Essential HTN - BP controlled. Continue present regimen. 4. Chronic diastolic CHF - at this point I told her we can probably have her just take Lasix 40mg  daily PRN rather than scheduled several times a week. Her edema is minor at this point. Will check BMET with labs to help guide what we need to do with standing KCl. May be able to d/c this as well.  Disposition: F/u with Dr. Angelena Form in 6 months.  Medication Adjustments/Labs and Tests Ordered: Current medicines are reviewed at length with the patient today.  Concerns regarding medicines are outlined above. Medication changes,  Labs and Tests ordered today are summarized above and listed in the Patient Instructions accessible in Encounters.   Signed, Charlie Pitter, PA-C  08/11/2018 2:18 PM    [facility] [address] [place], [place], [place]  [address] Phone: [phone]; Fax: [phone]

## 2018-08-10 NOTE — Telephone Encounter (Signed)
Faxed script through system since e-scribe failed

## 2018-08-10 NOTE — Addendum Note (Signed)
Addended by: Earnstine Regal on: 08/10/2018 09:04 AM   Modules accepted: Orders

## 2018-08-10 NOTE — Addendum Note (Signed)
Addended by: Earnstine Regal on: 08/10/2018 03:27 PM   Modules accepted: Orders

## 2018-08-10 NOTE — Addendum Note (Signed)
Addended by: Earnstine Regal on: 08/10/2018 09:02 AM   Modules accepted: Orders

## 2018-08-10 NOTE — Telephone Encounter (Signed)
Refill sent. See meds.  

## 2018-08-10 NOTE — Telephone Encounter (Signed)
Resent Levothyroxine to [facility].Marland KitchenJohny Chess

## 2018-08-11 ENCOUNTER — Other Ambulatory Visit: Payer: Self-pay

## 2018-08-11 ENCOUNTER — Ambulatory Visit (INDEPENDENT_AMBULATORY_CARE_PROVIDER_SITE_OTHER): Payer: Medicare Other | Admitting: Physician Assistant

## 2018-08-11 ENCOUNTER — Telehealth: Payer: Self-pay | Admitting: *Deleted

## 2018-08-11 ENCOUNTER — Telehealth: Payer: Self-pay | Admitting: Physician Assistant

## 2018-08-11 ENCOUNTER — Encounter: Payer: Self-pay | Admitting: Physician Assistant

## 2018-08-11 VITALS — BP 122/60 | HR 76 | Ht 65.0 in | Wt 170.0 lb

## 2018-08-11 DIAGNOSIS — I4819 Other persistent atrial fibrillation: Secondary | ICD-10-CM | POA: Diagnosis not present

## 2018-08-11 DIAGNOSIS — Z862 Personal history of diseases of the blood and blood-forming organs and certain disorders involving the immune mechanism: Secondary | ICD-10-CM | POA: Diagnosis not present

## 2018-08-11 DIAGNOSIS — I10 Essential (primary) hypertension: Secondary | ICD-10-CM | POA: Diagnosis not present

## 2018-08-11 DIAGNOSIS — I5032 Chronic diastolic (congestive) heart failure: Secondary | ICD-10-CM

## 2018-08-11 NOTE — Telephone Encounter (Signed)
I am sorry!  Please take BuSpar 7.5 mg 3 times a day we can fax the prescription.  Let me know if not better.  Thank you

## 2018-08-11 NOTE — Telephone Encounter (Signed)

## 2018-08-11 NOTE — Telephone Encounter (Signed)
Patient's daughter, [name] called stating patient is having a anxiety so bad that she is itching and scratching all over. She wants to know if her Buspar dose can be increased. Pam states the patient is having a "much worse time now than usual."  Please advise.

## 2018-08-11 NOTE — Patient Instructions (Signed)
Medication Instructions:  Your physician recommends that you continue on your current medications as directed. Please refer to the Current Medication list given to you today.  If you need a refill on your cardiac medications before your next appointment, please call your pharmacy.   Lab work: TODAY:  BMET & CBC  If you have labs (blood work) drawn today and your tests are completely normal, you will receive your results only by: [name] MyChart Message (if you have MyChart) OR . A paper copy in the mail If you have any lab test that is abnormal or we need to change your treatment, we will call you to review the results.  Testing/Procedures: None ordered   Follow-Up: At [facility], you and your health needs are our priority.  As part of our continuing mission to provide you with exceptional heart care, we have created designated Provider Care Teams.  These Care Teams include your primary Cardiologist (physician) and Advanced Practice Providers (APPs -  Physician Assistants and Nurse Practitioners) who all work together to provide you with the care you need, when you need it. You will need a follow up appointment in 6 months.  Please call our office 2 months in advance to schedule this appointment.  You may see Lauree Chandler, MD or one of the following Advanced Practice Providers on your designated Care Team:   [place], PA-C Melina Copa, PA-C . Ermalinda Barrios, PA-C  Any Other Special Instructions Will Be Listed Below (If Applicable).

## 2018-08-12 ENCOUNTER — Other Ambulatory Visit: Payer: Self-pay | Admitting: Internal Medicine

## 2018-08-12 LAB — BASIC METABOLIC PANEL
BUN/Creatinine Ratio: 19 (ref 12–28)
BUN: 20 mg/dL (ref 10–36)
CO2: 21 mmol/L (ref 20–29)
Calcium: 10 mg/dL (ref 8.7–10.3)
Chloride: 96 mmol/L (ref 96–106)
Creatinine, Ser: 1.08 mg/dL — ABNORMAL HIGH (ref 0.57–1.00)
GFR calc Af Amer: 52 mL/min/{1.73_m2} — ABNORMAL LOW (ref >59)
GFR calc non Af Amer: 45 mL/min/{1.73_m2} — ABNORMAL LOW (ref >59)
Glucose: 118 mg/dL — ABNORMAL HIGH (ref 65–99)
Potassium: 4.4 mmol/L (ref 3.5–5.2)
Sodium: 134 mmol/L (ref 134–144)

## 2018-08-12 LAB — CBC
Hematocrit: 42.9 % (ref 34.0–46.6)
Hemoglobin: 14.4 g/dL (ref 11.1–15.9)
MCH: 34.4 pg — ABNORMAL HIGH (ref 26.6–33.0)
MCHC: 33.6 g/dL (ref 31.5–35.7)
MCV: 102 fL — ABNORMAL HIGH (ref 79–97)
Platelets: 186 10*3/uL (ref 150–450)
RBC: 4.19 x10E6/uL (ref 3.77–5.28)
RDW: 12.2 % (ref 11.7–15.4)
WBC: 5.5 10*3/uL (ref 3.4–10.8)

## 2018-08-12 MED ORDER — BUSPIRONE HCL 7.5 MG PO TABS: 7.5000 mg | ORAL_TABLET | Freq: Three times a day (TID) | ORAL | 1 refills | Status: DC

## 2018-08-12 NOTE — Telephone Encounter (Signed)
Left detailed message informing Pam of below.

## 2018-08-16 NOTE — Telephone Encounter (Signed)
Pharmacy called in asking to have Rx sent to the electronically instead. They still have not received Rx.    Pharmacy: [facility] [address], [place]

## 2018-08-17 MED ORDER — BUSPIRONE HCL 7.5 MG PO TABS: 7.5000 mg | ORAL_TABLET | Freq: Three times a day (TID) | ORAL | 1 refills | Status: DC

## 2018-08-17 NOTE — Telephone Encounter (Signed)
Resent rx electronically to [facility].Marland KitchenJohny Bell

## 2018-08-17 NOTE — Addendum Note (Signed)
Addended by: Earnstine Regal on: 08/17/2018 11:04 AM   Modules accepted: Orders

## 2018-08-26 ENCOUNTER — Telehealth: Payer: Self-pay | Admitting: *Deleted

## 2018-08-26 MED ORDER — POTASSIUM CHLORIDE CRYS ER 20 MEQ PO TBCR: EXTENDED_RELEASE_TABLET | ORAL | 3 refills | Status: DC

## 2018-08-26 NOTE — Telephone Encounter (Signed)
-----   Message from Charlie Pitter, [place] sent at 08/12/2018  7:52 AM EDT ----- Please let patient know labs are stable. Mild kidney insufficiency which seems average for her age. Blood count is stable.  She recently ran out of Lasix and did OK with it, so at yesterday's visit I told her she could take it PRN from here on out rather than regularly. Her potassium level is mid-normal so I would be OK with her changing her potassium to "as needed" as well, to take only when she has to take a Lasix. Dayna Dunn PA-C

## 2018-09-07 ENCOUNTER — Telehealth: Payer: Self-pay | Admitting: Oncology

## 2018-09-07 NOTE — Telephone Encounter (Signed)
Called patient regarding upcoming [facility] appointment, left a voicemail. This will be considered a phone visit due to no communication to set this up as virtual.

## 2018-09-08 ENCOUNTER — Other Ambulatory Visit: Payer: Self-pay

## 2018-09-08 ENCOUNTER — Ambulatory Visit: Payer: Medicare Other | Admitting: Oncology

## 2018-09-08 ENCOUNTER — Inpatient Hospital Stay: Payer: Medicare Other | Attending: Oncology | Admitting: Oncology

## 2018-09-08 ENCOUNTER — Inpatient Hospital Stay: Payer: Medicare Other

## 2018-09-08 ENCOUNTER — Other Ambulatory Visit: Payer: Medicare Other

## 2018-09-08 VITALS — BP 147/68 | HR 63 | Temp 99.1°F | Resp 18 | Ht 65.0 in | Wt 172.9 lb

## 2018-09-08 DIAGNOSIS — Z79811 Long term (current) use of aromatase inhibitors: Secondary | ICD-10-CM | POA: Diagnosis not present

## 2018-09-08 DIAGNOSIS — M8580 Other specified disorders of bone density and structure, unspecified site: Secondary | ICD-10-CM | POA: Diagnosis not present

## 2018-09-08 DIAGNOSIS — Z170 Estrogen receptor positive status [ER+]: Secondary | ICD-10-CM

## 2018-09-08 DIAGNOSIS — C50412 Malignant neoplasm of upper-outer quadrant of left female breast: Secondary | ICD-10-CM | POA: Insufficient documentation

## 2018-09-08 DIAGNOSIS — Z79899 Other long term (current) drug therapy: Secondary | ICD-10-CM | POA: Diagnosis not present

## 2018-09-08 DIAGNOSIS — Z7901 Long term (current) use of anticoagulants: Secondary | ICD-10-CM | POA: Insufficient documentation

## 2018-09-08 MED ORDER — ANASTROZOLE 1 MG PO TABS: 1.0000 mg | ORAL_TABLET | Freq: Every day | ORAL | 3 refills | Status: DC

## 2018-09-08 NOTE — Progress Notes (Signed)
[place]  [phone] [phone]     ID: [name] DOB: [birth year]  MR#: [id number]  CWC#:[id number]  Patient Care Team: [name], MD as PCP - General Angelena Form Annita Brod, MD as PCP - Cardiology (Cardiology) Rolm Bookbinder, MD as Consulting Physician (General Surgery) Magrinat, Virgie Dad, MD as Consulting Physician (Oncology) Burnell Blanks, MD as Consulting Physician (Cardiology) Jarome Matin, MD as Consulting Physician (Dermatology) Marlou Sa Tonna Corner, MD as Consulting Physician (Orthopedic Surgery) OTHER MD:  CHIEF COMPLAINT: Estrogen receptor positive invasive breast cancer  CURRENT TREATMENT: Anastrozole   INTERVAL HISTORY: [name] is seen today for follow-up and treatment of her estrogen receptor positive breast cancer. She was last seen on 07/28/2017.  She continues on anastrozole.   [name]'s last bone density screening on 04/30/2004, showed a T-score of -1.6, which is considered osteopenic.     He has not yet had mammography this year.   REVIEW OF SYSTEMS: [name] still lives at [facility].  She reports their food is brought to their room because of the virus. She states they walk through the building some and working on puzzles.  She tells me he has become quite demented and in fact was very worried because they would not let him come back in with her and she was afraid he might wander off.  A detailed review of systems was otherwise noncontributory.    BREAST CANCER HISTORY: From the original intake note:  [name] noted a change in her left breast and had bilateral screening mammography at [facility] 06/07/2015, with tomosynthesis. Breast density was category A. There was a new oval mass in the left breast central to the nipple. Left breast ultrasound the same day confirmed a 3.4 cm oval mass in the left breast upper outer quadrant, correlating with the mammography. The left axilla was sonographically  benign.  Biopsy of the left breast mass in question 06/11/2015 showed ([id number]) a papillary lesion, consistent with an intraductal papilloma, but due to fragmentation the edges of the lesion could not be assessed.  Accordingly the patient was referred to surgery, and after appropriate discussion underwent left lumpectomy 07/25/2015. The final pathology ([id number]) showed 2 foci of invasive ductal carcinoma, grade 2, measuring 3.0 cm, and grade 1, measuring 0.9 cm. There was also ductal carcinoma in situ. The invasive tumor was present at the superior margin, and ductal carcinoma in situ was focally less than 0.1 cm to the same margin. There was evidence of lymphovascular invasion. The prognostic panel showed estrogen receptor 100% positive, progesterone receptor 100% positive, both with strong staining intensity, for both lesions. HER-2 was also negative for both lesions, the signals ratio being 1.45-1.65, and the number per cell 2.10-2.80.  The patient's subsequent history is as detailed below   PAST MEDICAL HISTORY: Past Medical History:  Diagnosis Date  . Anxiety    denies  . Atrial flutter ([name])   . Bilateral edema of lower extremity   . Breast cancer ([place])    left breast  . Chronic constipation   . Chronic diastolic CHF (congestive heart failure) ([place])   . Diverticulosis of colon   . GERD (gastroesophageal reflux disease)   . H/O hiatal hernia   . History of cellulitis    LEFT LOWER LEG --  SEPT 2015  . History of colitis    DX  2003  WITH ISCHEMIC COLITIS  . History of diverticulitis of colon    perferated diverticulitis w/ abscess 08-25-2013 drain placed --  resolved without surgical intervention  . History of GI bleed    2011--- UPPER SECONARY GASTRIC ULCER FROM NSAID USE  . History of Helicobacter pylori infection    2011  . History of ulcer of lower limb    2012   RIGHT LOWER EXTREMITIY--  STATSIS ULCER  . Hyperlipidemia   . Hypertension   . Hypothyroidism   .  Iron deficiency anemia 2011   elevated MCV  . LBP (low back pain)    severe lumbar spondylosis s/p L3/L4,L4/L5 fusion 12/10  . Lumbar spondylolysis   . OA (osteoarthritis)    "hands" (08/25/2013)  . Open wound of left lower leg   . Osteopenia   . Persistent atrial fibrillation    CARDIOLOGIST-   DR Burman Foster  . RBBB   . Urinary frequency     PAST SURGICAL HISTORY: Past Surgical History:  Procedure Laterality Date  . APPLICATION OF A-CELL OF EXTREMITY Left 06/21/2013   Procedure: APPLICATION OF A-CELL OF EXTREMITY;  Surgeon: Theodoro Kos, DO;  Location: [place];  Service: Plastics;  Laterality: Left;  . APPLICATION OF A-CELL OF EXTREMITY Left 08/22/2013   Procedure: APPLICATION OF A-CELL/VAC TO LOWER LEFT LEG WOUND;  Surgeon: Theodoro Kos, DO;  Location: [place];  Service: Plastics;  Laterality: Left;  . APPLICATION OF A-CELL OF EXTREMITY Left 02/06/2014   Procedure: WITH PLACEMENT OF A -CELL ;  Surgeon: Theodoro Kos, DO;  Location: [place];  Service: Plastics;  Laterality: Left;  . APPLICATION OF A-CELL OF EXTREMITY Left 08/09/2014   Procedure: PLACEMENT OF A CELL;  Surgeon: Theodoro Kos, DO;  Location: [place];  Service: Plastics;  Laterality: Left;  . APPLICATION OF WOUND VAC Left 06/16/2013   Procedure: APPLICATION OF WOUND VAC;  Surgeon: Meredith Pel, MD;  Location: [place];  Service: Orthopedics;  Laterality: Left;  . APPLICATION OF WOUND VAC Left 06/21/2013   Procedure: APPLICATION OF WOUND VAC;  Surgeon: Theodoro Kos, DO;  Location: [place];  Service: Plastics;  Laterality: Left;  . BIOPSY BREAST  07/25/2015   LEFT RADIOACTIVE SEED GUIDED EXCISIONAL BREAST BIOPSY (Left) as a surgical intervention  . EXTERNAL FIXATION LEG Left 06/16/2013   Procedure: EXTERNAL FIXATION LEG;  Surgeon: Meredith Pel, MD;  Location: [place];  Service: Orthopedics;  Laterality: Left;  . EXTERNAL FIXATION REMOVAL Left 06/21/2013   Procedure: REMOVAL EXTERNAL FIXATION  LEG;  Surgeon: Meredith Pel, MD;  Location: [place];  Service: Orthopedics;  Laterality: Left;  . EYE SURGERY Bilateral    cataract removal  . HARDWARE REMOVAL Left 05/23/2014   Procedure: HARDWARE REMOVAL;  Surgeon: Meredith Pel, MD;  Location: [place];  Service: Orthopedics;  Laterality: Left;  REMOVAL OF HARDWARE LEFT TIBIA  . I&D EXTREMITY Left 06/16/2013   Procedure: IRRIGATION AND DEBRIDEMENT EXTREMITY;  Surgeon: Meredith Pel, MD;  Location: [place];  Service: Orthopedics;  Laterality: Left;  . I&D EXTREMITY Left 02/06/2014   Procedure: IRRIGATION AND DEBRIDEMENT LEFT LEG WOUND ;  Surgeon: Theodoro Kos, DO;  Location: [place];  Service: Plastics;  Laterality: Left;  . I&D EXTREMITY Left 08/09/2014   Procedure: IRRIGATION AND DEBRIDEMENT  OF LEFT LOWER LEG;  Surgeon: Theodoro Kos, DO;  Location: [place];  Service: Plastics;  Laterality: Left;  . LUMBAR FUSION  01-02-2009   L3-L5  . RADIOACTIVE SEED GUIDED EXCISIONAL BREAST BIOPSY Left 07/25/2015   Procedure: LEFT RADIOACTIVE SEED GUIDED EXCISIONAL BREAST BIOPSY;  Surgeon: Rolm Bookbinder, MD;  [facility];  Service: General;  Laterality: Left;  . RE-EXCISION OF BREAST LUMPECTOMY Left 09/05/2015   Procedure: RE-EXCISION OF LEFT BREAST LUMPECTOMY;  Surgeon: Rolm Bookbinder, MD;  Location: [place];  Service: General;  Laterality: Left;  . SHOULDER OPEN ROTATOR CUFF REPAIR Left 1997  . TIBIA IM NAIL INSERTION Left 06/21/2013   Procedure: INTRAMEDULLARY (IM) NAIL TIBIAL;  Surgeon: Meredith Pel, MD;  Location: [place];  Service: Orthopedics;  Laterality: Left;  . TONSILLECTOMY  AS CHILD  . TOTAL HIP ARTHROPLASTY Right 06-07-2010  . TRANSTHORACIC ECHOCARDIOGRAM  09-11-2010   DR MCALHANY   LVSF  55-60%/  MILD MV CALCIFICATION WITH NO STENOSIS/  MILD MR & TR / MODERATE LAE/  MODERATE TO SEVERE RAE  . UPPER GI ENDOSCOPY  03-29-2010    FAMILY HISTORY Family History  Problem Relation Age of Onset  . Cancer Father    . Hypertension Other   The patient's father died at the age of 88 from heart problems. The patient's mother died at the age of 38 with a ruptured aneurysm. The patient had no brothers, 1 sister. There is no history of breast or ovarian cancer in the family.    GYNECOLOGIC HISTORY:  No LMP recorded. Patient is postmenopausal. Menarche age 88, first live birth age 71. The patient is GX P2. She stopped having periods in her early 75s. She did not take hormone replacement. She used oral contraceptives remotely for approximately 2 years, with no complications.   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 Her husband Ernie Hew was a Airline pilot. They currently live at [facility]. Daughter Jerene Dilling lives in [place], near [place]. With her husband Jenny Reichmann. Daughter Pam lives in [place]. The patient has 2 grandchildren and 4 great-grandchildren. She is a Psychologist, forensic.   ADVANCED DIRECTIVES: The patient has a living will in place and she has named her granddaughter Almetta Lovely as her healthcare part of attorney. Lattie Haw can be reached at [place]: Social History   Tobacco Use  . Smoking status: Never Smoker  . Smokeless tobacco: Never Used  Substance Use Topics  . Alcohol use: No    Comment: 1 glass occ wine 08/03/14- none in a year  . Drug use: No     Colonoscopy: Laurence Spates, remote  PAP:  Bone density:   Allergies  Allergen Reactions  . Zosyn [Piperacillin Sod-Tazobactam So] Hives, Rash and Other (See Comments)    Morbilliform eruption with itching  . Atenolol Nausea And Vomiting  . Clarithromycin Nausea And Vomiting  . Codeine Sulfate Nausea Only  . Levaquin [Levofloxacin] Nausea Only  . Macrodantin Other (See Comments)    UNSPECIFIED   . Oxycodone-Acetaminophen Nausea And Vomiting  . Percocet [Oxycodone-Acetaminophen] Nausea And Vomiting    Current Outpatient Medications  Medication Sig Dispense Refill  .  acetaminophen (TYLENOL) 500 MG tablet Take 500 mg by mouth daily as needed for mild pain.     [name] anastrozole (ARIMIDEX) 1 MG tablet Take 1 tablet (1 mg total) by mouth daily. 90 tablet 3  . apixaban (ELIQUIS) 5 MG TABS tablet Take 1 tablet (5 mg total) by mouth 2 (two) times daily. 180 tablet 3  . atorvastatin (LIPITOR) 10 MG tablet Take 1 tablet (10 mg total) by mouth every evening. 90 tablet 1  . B Complex-C (B-COMPLEX WITH VITAMIN C) tablet Take 1 tablet by mouth daily.    . busPIRone (BUSPAR) 7.5 MG tablet  Take 1 tablet (7.5 mg total) by mouth 3 (three) times daily. 270 tablet 1  . Cholecalciferol (VITAMIN D3) 1000 UNITS tablet Take 1,000 Units by mouth daily.      [name] diltiazem (CARDIZEM CD) 120 MG 24 hr capsule TAKE 1 CAPSULE (120 MG TOTAL) BY MOUTH DAILY. 90 capsule 1  . doxycycline (VIBRA-TABS) 100 MG tablet Take 1 tablet (100 mg total) by mouth 2 (two) times daily. 60 tablet 11  . furosemide (LASIX) 40 MG tablet Take 40 mg by mouth daily as needed.    [name] levothyroxine (SYNTHROID) 25 MCG tablet TAKE 1 TABLET BY MOUTH DAILY FOR THYROID. 30 tablet 11  . linaclotide (LINZESS) 290 MCG CAPS capsule Take 1 capsule (290 mcg total) by mouth daily. 90 capsule 3  . metoprolol succinate (TOPROL-XL) 50 MG 24 hr tablet Take 1 tablet (50 mg total) by mouth 2 (two) times daily. Take with or immediately following a meal. 180 tablet 1  . Multiple Vitamins-Minerals (MULTIVITAMIN WITH MINERALS) tablet Take 1 tablet by mouth daily.    [name] omeprazole (PRILOSEC) 20 MG capsule Take 20 mg by mouth daily as needed (acid reflux).     [name] oxybutynin (DITROPAN) 5 MG tablet Take 1 tablet (5 mg total) by mouth 2 (two) times daily. 180 tablet 1  . polyethylene glycol powder (GLYCOLAX/MIRALAX) powder Take 17-34 g by mouth 2 (two) times daily as needed for moderate constipation. 850 g 5  . potassium chloride SA (K-DUR) 20 MEQ tablet Take 1 tablet by mouth only when you have to use a lasix 90 tablet 3  . temazepam (RESTORIL) 30 MG  capsule Take 1 capsule (30 mg total) by mouth at bedtime. 30 capsule 5  . vitamin C (ASCORBIC ACID) 500 MG tablet Take 500 mg by mouth daily.    [name] zinc sulfate (ZINC-220) 220 (50 Zn) MG capsule Take 220 mg by mouth daily.     No current facility-administered medications for this visit.     OBJECTIVE: Elderly white woman examined in a chair  Vitals:   09/08/18 1115  BP: (!) 147/68  Pulse: 63  Resp: 18  Temp: 99.1 F (37.3 C)  SpO2: 99%     Body mass index is 28.77 kg/m.     ECOG FS:1 - Symptomatic but completely ambulatory  Sclerae unicteric, EOMs intact Wearing a mask No cervical or supraclavicular adenopathy Lungs no rales or rhonchi Heart regular rate and rhythm Abd soft, nontender, positive bowel sounds MSK no focal spinal tenderness, no upper extremity lymphedema Neuro: nonfocal, well oriented, appropriate affect Breasts: The right breast is benign.  The left breast is status post lumpectomy.  There is no evidence of local recurrence.  Both axillae are benign.    LAB RESULTS:  CMP     Component Value Date/Time   NA 134 08/11/2018 1425   NA 137 07/28/2016 1334   K 4.4 08/11/2018 1425   K 3.8 07/28/2016 1334   CL 96 08/11/2018 1425   CO2 21 08/11/2018 1425   CO2 28 07/28/2016 1334   GLUCOSE 118 (H) 08/11/2018 1425   GLUCOSE 119 (H) 07/14/2018 1429   GLUCOSE 160 (H) 07/28/2016 1334   BUN 20 08/11/2018 1425   BUN 9.5 07/28/2016 1334   CREATININE 1.08 (H) 08/11/2018 1425   CREATININE 1.0 07/28/2016 1334   CALCIUM 10.0 08/11/2018 1425   CALCIUM 9.6 07/28/2016 1334   PROT 7.3 07/28/2017 1411   PROT 7.4 07/28/2016 1334   ALBUMIN 4.2 07/28/2017 1411   ALBUMIN  4.0 07/28/2016 1334   AST 22 07/28/2017 1411   AST 21 07/28/2016 1334   ALT 20 07/28/2017 1411   ALT 24 07/28/2016 1334   ALKPHOS 78 07/28/2017 1411   ALKPHOS 96 07/28/2016 1334   BILITOT 1.0 07/28/2017 1411   BILITOT 1.21 (H) 07/28/2016 1334   GFRNONAA 45 (L) 08/11/2018 1425   GFRAA 52 (L)  08/11/2018 1425    INo results found for: SPEP, UPEP  Lab Results  Component Value Date   WBC 5.5 08/11/2018   NEUTROABS 2.5 07/28/2017   HGB 14.4 08/11/2018   HCT 42.9 08/11/2018   MCV 102 (H) 08/11/2018   PLT 186 08/11/2018      Chemistry      Component Value Date/Time   NA 134 08/11/2018 1425   NA 137 07/28/2016 1334   K 4.4 08/11/2018 1425   K 3.8 07/28/2016 1334   CL 96 08/11/2018 1425   CO2 21 08/11/2018 1425   CO2 28 07/28/2016 1334   BUN 20 08/11/2018 1425   BUN 9.5 07/28/2016 1334   CREATININE 1.08 (H) 08/11/2018 1425   CREATININE 1.0 07/28/2016 1334      Component Value Date/Time   CALCIUM 10.0 08/11/2018 1425   CALCIUM 9.6 07/28/2016 1334   ALKPHOS 78 07/28/2017 1411   ALKPHOS 96 07/28/2016 1334   AST 22 07/28/2017 1411   AST 21 07/28/2016 1334   ALT 20 07/28/2017 1411   ALT 24 07/28/2016 1334   BILITOT 1.0 07/28/2017 1411   BILITOT 1.21 (H) 07/28/2016 1334       No results found for: LABCA2  No components found for: LABCA125  No results for input(s): INR in the last 168 hours.  Urinalysis    Component Value Date/Time   COLORURINE STRAW (A) 05/23/2017 0228   APPEARANCEUR CLEAR 05/23/2017 0228   LABSPEC 1.006 05/23/2017 0228   PHURINE 7.0 05/23/2017 0228   GLUCOSEU NEGATIVE 05/23/2017 0228   GLUCOSEU NEGATIVE 01/17/2015 1415   HGBUR NEGATIVE 05/23/2017 0228   BILIRUBINUR NEGATIVE 05/23/2017 0228   KETONESUR NEGATIVE 05/23/2017 0228   PROTEINUR NEGATIVE 05/23/2017 0228   UROBILINOGEN 0.2 01/17/2015 1415   NITRITE NEGATIVE 05/23/2017 0228   LEUKOCYTESUR NEGATIVE 05/23/2017 0228     STUDIES: No results found.   ELIGIBLE FOR AVAILABLE RESEARCH PROTOCOL: no  ASSESSMENT: 85 y.o. [place] woman status post left breast upper outer quadrant biopsy 06/11/2015 for a papillary lesion with unclear margins.  (1) status post left lumpectomy 07/25/2015 for an mpT2 cN0, stage IIA  invasive ductal carcinoma,  grade I-II  estrogen and  progesterone receptor strongly positive, HER-2 not amplified   (a) additional surgery 09/05/2015 found some residual ductal carcinoma in situ but the margins were clear.  (2) no sentinel lymph node sampling or adjuvant radiation planned  (3) adjuvant anastrozole started 08/10/2015  PLAN:  Derian is now 3 years out from definitive surgery for breast cancer with no evidence of disease recurrence.  This is very favorable.  She is tolerating anastrozole well and the plan is to continue that for a total of 5 years.  She is due for mammography later this month and I have added a bone density to be done at the same time.  She will see [place] again in 1 year.  She knows to call for any issue that may develop before the next visit.  Magrinat, Virgie Dad, MD  09/08/18 11:46 AM Medical Oncology and Hematology [facility] [place], [place] [address] Tel.  [phone]    Fax. [phone]   I, Wilburn Mylar, am acting as scribe for Dr. Virgie Dad. Magrinat.  I, Lurline Del MD, have reviewed the above documentation for accuracy and completeness, and I agree with the above.

## 2018-09-10 ENCOUNTER — Telehealth: Payer: Self-pay | Admitting: Oncology

## 2018-09-10 NOTE — Telephone Encounter (Signed)
I could not reach patient regarding schedule  °

## 2018-09-21 DIAGNOSIS — L218 Other seborrheic dermatitis: Secondary | ICD-10-CM | POA: Diagnosis not present

## 2018-09-21 DIAGNOSIS — Z85828 Personal history of other malignant neoplasm of skin: Secondary | ICD-10-CM | POA: Diagnosis not present

## 2018-09-21 DIAGNOSIS — L821 Other seborrheic keratosis: Secondary | ICD-10-CM | POA: Diagnosis not present

## 2018-09-21 DIAGNOSIS — D485 Neoplasm of uncertain behavior of skin: Secondary | ICD-10-CM | POA: Diagnosis not present

## 2018-09-21 DIAGNOSIS — L570 Actinic keratosis: Secondary | ICD-10-CM | POA: Diagnosis not present

## 2018-09-21 DIAGNOSIS — L820 Inflamed seborrheic keratosis: Secondary | ICD-10-CM | POA: Diagnosis not present

## 2018-09-30 ENCOUNTER — Telehealth: Payer: Medicare Other | Admitting: Cardiovascular Disease

## 2018-10-04 ENCOUNTER — Encounter: Payer: Self-pay | Admitting: Family

## 2018-10-04 ENCOUNTER — Telehealth: Payer: Self-pay | Admitting: Internal Medicine

## 2018-10-04 ENCOUNTER — Ambulatory Visit (INDEPENDENT_AMBULATORY_CARE_PROVIDER_SITE_OTHER): Payer: Medicare Other | Admitting: Family

## 2018-10-04 DIAGNOSIS — B029 Zoster without complications: Secondary | ICD-10-CM | POA: Diagnosis not present

## 2018-10-04 MED ORDER — VALACYCLOVIR HCL 1 G PO TABS: 1000.0000 mg | ORAL_TABLET | Freq: Three times a day (TID) | ORAL | 0 refills | Status: DC

## 2018-10-04 MED ORDER — PREDNISONE 20 MG PO TABS: 20.0000 mg | ORAL_TABLET | Freq: Every day | ORAL | 0 refills | Status: DC

## 2018-10-04 NOTE — Telephone Encounter (Signed)
Pt had Virtual with Mickel Baas today

## 2018-10-04 NOTE — Telephone Encounter (Signed)
Copied from [place] [id number]. Topic: Appointment Scheduling - Scheduling Inquiry for Clinic >> Oct 04, 2018 10:35 AM Berneta Levins wrote: Reason for CRM:   Anderson Malta from [facility] calling.  States that pt needs to be seen for possible shingles.  It is right under breast going around to right side of back.  Anderson Malta states that they can faciliate either bringing pt in today or doing a virtual visit.   Per [facility] send CRM high priority.

## 2018-10-04 NOTE — Progress Notes (Signed)
[name] is a 85 y.o. female with the following history as recorded in EpicCare:  Patient Active Problem List   Diagnosis Date Noted  . Chronic osteomyelitis of left tibia ([place]) 05/23/2017  . Community acquired pneumonia of left lung   . Osteoporosis 08/22/2016  . Malignant neoplasm of upper-outer quadrant of left breast in female, estrogen receptor positive ([place]) 08/10/2015  . Breast mass in female 07/25/2015  . Left breast mass 07/05/2015  . Incontinence in female 07/05/2014  . Edema 01/17/2014  . Morbilliform rash 09/06/2013  . Sepsis ([place]) 08/25/2013  . Metabolic acidosis 99991111  . Diverticulitis of colon with perforation 08/25/2013  . Constipation 06/22/2013  . Open tibial fracture 06/17/2013  . Encounter for therapeutic drug monitoring 02/16/2013  . Well adult exam 07/21/2012  . Paresthesia 01/28/2012  . Chronic venous insufficiency 11/11/2010  . Actinic keratoses 05/30/2010  . Long term current use of anticoagulant 03/12/2010  . PEPTIC ULCER DISEASE 11/22/2009  . Iron deficiency anemia 10/31/2009  . HIP PAIN 10/31/2009  . Hypothyroidism 10/25/2009  . Disorder resulting from impaired renal function 03/19/2009  . INSOMNIA, PERSISTENT 11/07/2008  . Low back pain 09/04/2008  . Atrial flutter ([place]) 05/31/2008  . Essential hypertension 11/30/2006  . Osteoarthritis 11/30/2006  . Disorder of bone and cartilage 11/30/2006    Current Outpatient Medications  Medication Sig Dispense Refill  . acetaminophen (TYLENOL) 500 MG tablet Take 500 mg by mouth daily as needed for mild pain.     [name] anastrozole (ARIMIDEX) 1 MG tablet Take 1 tablet (1 mg total) by mouth daily. 90 tablet 3  . apixaban (ELIQUIS) 5 MG TABS tablet Take 1 tablet (5 mg total) by mouth 2 (two) times daily. 180 tablet 3  . atorvastatin (LIPITOR) 10 MG tablet Take 1 tablet (10 mg total) by mouth every evening. 90 tablet 1  . B Complex-C (B-COMPLEX WITH VITAMIN C) tablet Take 1 tablet by mouth daily.    .  busPIRone (BUSPAR) 7.5 MG tablet Take 1 tablet (7.5 mg total) by mouth 3 (three) times daily. 270 tablet 1  . Cholecalciferol (VITAMIN D3) 1000 UNITS tablet Take 1,000 Units by mouth daily.      [name] diltiazem (CARDIZEM CD) 120 MG 24 hr capsule TAKE 1 CAPSULE (120 MG TOTAL) BY MOUTH DAILY. 90 capsule 1  . furosemide (LASIX) 40 MG tablet Take 40 mg by mouth daily as needed.    [name] levothyroxine (SYNTHROID) 25 MCG tablet TAKE 1 TABLET BY MOUTH DAILY FOR THYROID. 30 tablet 11  . linaclotide (LINZESS) 290 MCG CAPS capsule Take 1 capsule (290 mcg total) by mouth daily. 90 capsule 3  . metoprolol succinate (TOPROL-XL) 50 MG 24 hr tablet Take 1 tablet (50 mg total) by mouth 2 (two) times daily. Take with or immediately following a meal. 180 tablet 1  . Multiple Vitamins-Minerals (MULTIVITAMIN WITH MINERALS) tablet Take 1 tablet by mouth daily.    [name] omeprazole (PRILOSEC) 20 MG capsule Take 20 mg by mouth daily as needed (acid reflux).     [name] oxybutynin (DITROPAN) 5 MG tablet Take 1 tablet (5 mg total) by mouth 2 (two) times daily. 180 tablet 1  . polyethylene glycol powder (GLYCOLAX/MIRALAX) powder Take 17-34 g by mouth 2 (two) times daily as needed for moderate constipation. 850 g 5  . potassium chloride SA (K-DUR) 20 MEQ tablet Take 1 tablet by mouth only when you have to use a lasix 90 tablet 3  . predniSONE (DELTASONE) 20 MG tablet Take 1  tablet (20 mg total) by mouth daily with breakfast. 5 tablet 0  . temazepam (RESTORIL) 30 MG capsule Take 1 capsule (30 mg total) by mouth at bedtime. 30 capsule 5  . valACYclovir (VALTREX) 1000 MG tablet Take 1 tablet (1,000 mg total) by mouth 3 (three) times daily. 21 tablet 0  . vitamin C (ASCORBIC ACID) 500 MG tablet Take 500 mg by mouth daily.    [name] zinc sulfate (ZINC-220) 220 (50 Zn) MG capsule Take 220 mg by mouth daily.     No current facility-administered medications for this visit.     Allergies: Zosyn [piperacillin sod-tazobactam so], Atenolol, Clarithromycin,  Codeine sulfate, Levaquin [levofloxacin], Macrodantin, Oxycodone-acetaminophen, and Percocet [oxycodone-acetaminophen]  Past Medical History:  Diagnosis Date  . Anxiety    denies  . Atrial flutter ([name])   . Bilateral edema of lower extremity   . Breast cancer ([place])    left breast  . Chronic constipation   . Chronic diastolic CHF (congestive heart failure) ([place])   . Diverticulosis of colon   . GERD (gastroesophageal reflux disease)   . H/O hiatal hernia   . History of cellulitis    LEFT LOWER LEG --  SEPT 2015  . History of colitis    DX  2003  WITH ISCHEMIC COLITIS  . History of diverticulitis of colon    perferated diverticulitis w/ abscess 08-25-2013 drain placed --  resolved without surgical intervention  . History of GI bleed    2011--- UPPER SECONARY GASTRIC ULCER FROM NSAID USE  . History of Helicobacter pylori infection    2011  . History of ulcer of lower limb    2012   RIGHT LOWER EXTREMITIY--  STATSIS ULCER  . Hyperlipidemia   . Hypertension   . Hypothyroidism   . Iron deficiency anemia 2011   elevated MCV  . LBP (low back pain)    severe lumbar spondylosis s/p L3/L4,L4/L5 fusion 12/10  . Lumbar spondylolysis   . OA (osteoarthritis)    "hands" (08/25/2013)  . Open wound of left lower leg   . Osteopenia   . Persistent atrial fibrillation    CARDIOLOGIST-   DR Burman Foster  . RBBB   . Urinary frequency     Past Surgical History:  Procedure Laterality Date  . APPLICATION OF A-CELL OF EXTREMITY Left 06/21/2013   Procedure: APPLICATION OF A-CELL OF EXTREMITY;  Surgeon: Theodoro Kos, DO;  Location: [facility];  Service: Plastics;  Laterality: Left;  . APPLICATION OF A-CELL OF EXTREMITY Left 08/22/2013   Procedure: APPLICATION OF A-CELL/VAC TO LOWER LEFT LEG WOUND;  Surgeon: Theodoro Kos, DO;  Location: [facility];  Service: Plastics;  Laterality: Left;  . APPLICATION OF A-CELL OF EXTREMITY Left 02/06/2014   Procedure: WITH PLACEMENT OF A -CELL ;  Surgeon: Theodoro Kos, DO;  Location: [facility];  Service: Plastics;  Laterality: Left;  . APPLICATION OF A-CELL OF EXTREMITY Left 08/09/2014   Procedure: PLACEMENT OF A CELL;  Surgeon: Theodoro Kos, DO;  Location: [facility];  Service: Plastics;  Laterality: Left;  . APPLICATION OF WOUND VAC Left 06/16/2013   Procedure: APPLICATION OF WOUND VAC;  Surgeon: Meredith Pel, MD;  Location: [facility];  Service: Orthopedics;  Laterality: Left;  . APPLICATION OF WOUND VAC Left 06/21/2013   Procedure: APPLICATION OF WOUND VAC;  Surgeon: Theodoro Kos, DO;  Location: [facility];  Service: Plastics;  Laterality: Left;  . BIOPSY BREAST  07/25/2015   LEFT RADIOACTIVE SEED GUIDED EXCISIONAL BREAST  BIOPSY (Left) as a surgical intervention  . EXTERNAL FIXATION LEG Left 06/16/2013   Procedure: EXTERNAL FIXATION LEG;  Surgeon: Meredith Pel, MD;  Location: [facility];  Service: Orthopedics;  Laterality: Left;  . EXTERNAL FIXATION REMOVAL Left 06/21/2013   Procedure: REMOVAL EXTERNAL FIXATION LEG;  Surgeon: Meredith Pel, MD;  Location: [facility];  Service: Orthopedics;  Laterality: Left;  . EYE SURGERY Bilateral    cataract removal  . HARDWARE REMOVAL Left 05/23/2014   Procedure: HARDWARE REMOVAL;  Surgeon: Meredith Pel, MD;  Location: [facility];  Service: Orthopedics;  Laterality: Left;  REMOVAL OF HARDWARE LEFT TIBIA  . I&D EXTREMITY Left 06/16/2013   Procedure: IRRIGATION AND DEBRIDEMENT EXTREMITY;  Surgeon: Meredith Pel, MD;  Location: [facility];  Service: Orthopedics;  Laterality: Left;  . I&D EXTREMITY Left 02/06/2014   Procedure: IRRIGATION AND DEBRIDEMENT LEFT LEG WOUND ;  Surgeon: Theodoro Kos, DO;  Location: [facility];  Service: Plastics;  Laterality: Left;  . I&D EXTREMITY Left 08/09/2014   Procedure: IRRIGATION AND DEBRIDEMENT  OF LEFT LOWER LEG;  Surgeon: Theodoro Kos, DO;  Location: [facility];  Service: Plastics;  Laterality: Left;  . LUMBAR FUSION  01-02-2009   L3-L5  . RADIOACTIVE SEED  GUIDED EXCISIONAL BREAST BIOPSY Left 07/25/2015   Procedure: LEFT RADIOACTIVE SEED GUIDED EXCISIONAL BREAST BIOPSY;  Surgeon: Rolm Bookbinder, MD;  Location: [facility];  Service: General;  Laterality: Left;  . RE-EXCISION OF BREAST LUMPECTOMY Left 09/05/2015   Procedure: RE-EXCISION OF LEFT BREAST LUMPECTOMY;  Surgeon: Rolm Bookbinder, MD;  Location: [facility];  Service: General;  Laterality: Left;  . SHOULDER OPEN ROTATOR CUFF REPAIR Left 1997  . TIBIA IM NAIL INSERTION Left 06/21/2013   Procedure: INTRAMEDULLARY (IM) NAIL TIBIAL;  Surgeon: Meredith Pel, MD;  Location: [facility];  Service: Orthopedics;  Laterality: Left;  . TONSILLECTOMY  AS CHILD  . TOTAL HIP ARTHROPLASTY Right 06-07-2010  . TRANSTHORACIC ECHOCARDIOGRAM  09-11-2010   DR MCALHANY   LVSF  55-60%/  MILD MV CALCIFICATION WITH NO STENOSIS/  MILD MR & TR / MODERATE LAE/  MODERATE TO SEVERE RAE  . UPPER GI ENDOSCOPY  03-29-2010    Family History  Problem Relation Age of Onset  . Cancer Father   . Hypertension Other     Social History   Tobacco Use  . Smoking status: Never Smoker  . Smokeless tobacco: Never Used  Substance Use Topics  . Alcohol use: No    Comment: 1 glass occ wine 08/03/14- none in a year    Subjective:    I connected with [name] on 10/04/18 at  1:20 PM EDT by a video enabled telemedicine application and verified that I am speaking with the correct person using two identifiers. Patient and her 2 caregivers are on the video call.    I discussed the limitations of evaluation and management by telemedicine and the availability of in person appointments. The patient expressed understanding and agreed to proceed.  Complaining of shingles x 2 days; first noticed on Saturday evening; localized beneath right breast and extends to right upper back; denies any concerns for pain, numbness or tingling at site;       Objective:  There were no vitals filed for this visit.  General: Well developed, well  nourished, in no acute distress  Skin : Warm and dry. Rash c/w shingles is noted on the screen Head: Normocephalic and atraumatic  Lungs: Respirations unlabored;  Neurologic: Alert and oriented;  speech intact; face symmetrical;   Assessment:  1. Herpes zoster without complication     Plan:  Rx for Valtrex 1 gm tid x 7 days; Rx for Prednisone 20 mg qd x 5 days; she understands to call back if pain develops and can consider trial of Lyrica or Gabapentin; follow-up as needed otherwise.   No follow-ups on file.  No orders of the defined types were placed in this encounter.   Requested Prescriptions   Signed Prescriptions Disp Refills  . valACYclovir (VALTREX) 1000 MG tablet 21 tablet 0    Sig: Take 1 tablet (1,000 mg total) by mouth 3 (three) times daily.  . predniSONE (DELTASONE) 20 MG tablet 5 tablet 0    Sig: Take 1 tablet (20 mg total) by mouth daily with breakfast.

## 2018-10-06 ENCOUNTER — Ambulatory Visit: Payer: Medicare Other | Admitting: Cardiovascular Disease

## 2018-10-07 ENCOUNTER — Other Ambulatory Visit: Payer: Self-pay

## 2018-10-07 ENCOUNTER — Encounter (HOSPITAL_COMMUNITY): Payer: Self-pay

## 2018-10-07 ENCOUNTER — Inpatient Hospital Stay (HOSPITAL_COMMUNITY)
Admission: EM | Admit: 2018-10-07 | Discharge: 2018-10-11 | DRG: 866 | Disposition: A | Discharge status: Home / Self Care | Payer: Medicare Other | Attending: Internal Medicine | Admitting: Internal Medicine

## 2018-10-07 ENCOUNTER — Emergency Department (HOSPITAL_COMMUNITY): Payer: Medicare Other

## 2018-10-07 DIAGNOSIS — K449 Diaphragmatic hernia without obstruction or gangrene: Secondary | ICD-10-CM | POA: Diagnosis present

## 2018-10-07 DIAGNOSIS — D508 Other iron deficiency anemias: Secondary | ICD-10-CM

## 2018-10-07 DIAGNOSIS — Z20828 Contact with and (suspected) exposure to other viral communicable diseases: Secondary | ICD-10-CM | POA: Diagnosis present

## 2018-10-07 DIAGNOSIS — K219 Gastro-esophageal reflux disease without esophagitis: Secondary | ICD-10-CM | POA: Diagnosis present

## 2018-10-07 DIAGNOSIS — Z885 Allergy status to narcotic agent status: Secondary | ICD-10-CM

## 2018-10-07 DIAGNOSIS — D509 Iron deficiency anemia, unspecified: Secondary | ICD-10-CM | POA: Diagnosis present

## 2018-10-07 DIAGNOSIS — K5730 Diverticulosis of large intestine without perforation or abscess without bleeding: Secondary | ICD-10-CM | POA: Diagnosis present

## 2018-10-07 DIAGNOSIS — Z809 Family history of malignant neoplasm, unspecified: Secondary | ICD-10-CM | POA: Diagnosis not present

## 2018-10-07 DIAGNOSIS — Z881 Allergy status to other antibiotic agents status: Secondary | ICD-10-CM

## 2018-10-07 DIAGNOSIS — I110 Hypertensive heart disease with heart failure: Secondary | ICD-10-CM | POA: Diagnosis present

## 2018-10-07 DIAGNOSIS — M19042 Primary osteoarthritis, left hand: Secondary | ICD-10-CM | POA: Diagnosis present

## 2018-10-07 DIAGNOSIS — Z96641 Presence of right artificial hip joint: Secondary | ICD-10-CM | POA: Diagnosis present

## 2018-10-07 DIAGNOSIS — Z66 Do not resuscitate: Secondary | ICD-10-CM | POA: Diagnosis present

## 2018-10-07 DIAGNOSIS — E039 Hypothyroidism, unspecified: Secondary | ICD-10-CM | POA: Diagnosis present

## 2018-10-07 DIAGNOSIS — M5489 Other dorsalgia: Secondary | ICD-10-CM | POA: Diagnosis not present

## 2018-10-07 DIAGNOSIS — B027 Disseminated zoster: Secondary | ICD-10-CM | POA: Diagnosis not present

## 2018-10-07 DIAGNOSIS — B029 Zoster without complications: Secondary | ICD-10-CM

## 2018-10-07 DIAGNOSIS — M19041 Primary osteoarthritis, right hand: Secondary | ICD-10-CM | POA: Diagnosis present

## 2018-10-07 DIAGNOSIS — Z03818 Encounter for observation for suspected exposure to other biological agents ruled out: Secondary | ICD-10-CM | POA: Diagnosis not present

## 2018-10-07 DIAGNOSIS — Z170 Estrogen receptor positive status [ER+]: Secondary | ICD-10-CM | POA: Diagnosis not present

## 2018-10-07 DIAGNOSIS — Z981 Arthrodesis status: Secondary | ICD-10-CM

## 2018-10-07 DIAGNOSIS — I4510 Unspecified right bundle-branch block: Secondary | ICD-10-CM | POA: Diagnosis present

## 2018-10-07 DIAGNOSIS — Z7901 Long term (current) use of anticoagulants: Secondary | ICD-10-CM | POA: Diagnosis not present

## 2018-10-07 DIAGNOSIS — R52 Pain, unspecified: Secondary | ICD-10-CM | POA: Diagnosis not present

## 2018-10-07 DIAGNOSIS — E876 Hypokalemia: Secondary | ICD-10-CM | POA: Diagnosis present

## 2018-10-07 DIAGNOSIS — I5032 Chronic diastolic (congestive) heart failure: Secondary | ICD-10-CM | POA: Diagnosis present

## 2018-10-07 DIAGNOSIS — I10 Essential (primary) hypertension: Secondary | ICD-10-CM | POA: Diagnosis not present

## 2018-10-07 DIAGNOSIS — I4892 Unspecified atrial flutter: Secondary | ICD-10-CM | POA: Diagnosis present

## 2018-10-07 DIAGNOSIS — Z853 Personal history of malignant neoplasm of breast: Secondary | ICD-10-CM

## 2018-10-07 DIAGNOSIS — I4819 Other persistent atrial fibrillation: Secondary | ICD-10-CM | POA: Diagnosis present

## 2018-10-07 DIAGNOSIS — M8580 Other specified disorders of bone density and structure, unspecified site: Secondary | ICD-10-CM | POA: Diagnosis present

## 2018-10-07 DIAGNOSIS — C50412 Malignant neoplasm of upper-outer quadrant of left female breast: Secondary | ICD-10-CM

## 2018-10-07 DIAGNOSIS — Z79890 Hormone replacement therapy (postmenopausal): Secondary | ICD-10-CM | POA: Diagnosis not present

## 2018-10-07 DIAGNOSIS — E785 Hyperlipidemia, unspecified: Secondary | ICD-10-CM | POA: Diagnosis present

## 2018-10-07 DIAGNOSIS — R0902 Hypoxemia: Secondary | ICD-10-CM | POA: Diagnosis not present

## 2018-10-07 DIAGNOSIS — R509 Fever, unspecified: Secondary | ICD-10-CM | POA: Diagnosis not present

## 2018-10-07 DIAGNOSIS — R21 Rash and other nonspecific skin eruption: Secondary | ICD-10-CM | POA: Diagnosis not present

## 2018-10-07 DIAGNOSIS — B028 Zoster with other complications: Secondary | ICD-10-CM

## 2018-10-07 DIAGNOSIS — Z888 Allergy status to other drugs, medicaments and biological substances status: Secondary | ICD-10-CM

## 2018-10-07 LAB — URINALYSIS, ROUTINE W REFLEX MICROSCOPIC
Bacteria, UA: NONE SEEN
Bilirubin Urine: NEGATIVE
Glucose, UA: NEGATIVE mg/dL
Hgb urine dipstick: NEGATIVE
Ketones, ur: NEGATIVE mg/dL
Leukocytes,Ua: NEGATIVE
Nitrite: NEGATIVE
Protein, ur: 30 mg/dL — AB
Specific Gravity, Urine: 1.012 (ref 1.005–1.030)
pH: 6 (ref 5.0–8.0)

## 2018-10-07 LAB — CBC WITH DIFFERENTIAL/PLATELET
Basophils Absolute: 0 10*3/uL (ref 0.0–0.1)
Basophils Relative: 0 %
Eosinophils Absolute: 0 10*3/uL (ref 0.0–0.5)
Eosinophils Relative: 0 %
HCT: 40.2 % (ref 36.0–46.0)
Hemoglobin: 13.4 g/dL (ref 12.0–15.0)
Lymphocytes Relative: 17 %
Lymphs Abs: 1 10*3/uL (ref 0.7–4.0)
MCH: 34.6 pg — ABNORMAL HIGH (ref 26.0–34.0)
MCHC: 33.3 g/dL (ref 30.0–36.0)
MCV: 103.9 fL — ABNORMAL HIGH (ref 80.0–100.0)
Monocytes Absolute: 1 10*3/uL (ref 0.1–1.0)
Monocytes Relative: 10 %
Neutro Abs: 4.1 10*3/uL (ref 1.7–7.7)
Neutrophils Relative %: 73 %
Platelets: 169 10*3/uL (ref 150–400)
RBC: 3.87 MIL/uL (ref 3.87–5.11)
RDW: 12.8 % (ref 11.5–15.5)
WBC: 6.7 10*3/uL (ref 4.0–10.5)
nRBC: 0 % (ref 0.0–0.2)

## 2018-10-07 LAB — BASIC METABOLIC PANEL
Anion gap: 14 (ref 5–15)
BUN: 18 mg/dL (ref 8–23)
CO2: 28 mmol/L (ref 22–32)
Calcium: 8.5 mg/dL — ABNORMAL LOW (ref 8.9–10.3)
Chloride: 90 mmol/L — ABNORMAL LOW (ref 98–111)
Creatinine, Ser: 1.12 mg/dL — ABNORMAL HIGH (ref 0.44–1.00)
GFR calc Af Amer: 50 mL/min — ABNORMAL LOW (ref >60)
GFR calc non Af Amer: 43 mL/min — ABNORMAL LOW (ref >60)
Glucose, Bld: 111 mg/dL — ABNORMAL HIGH (ref 70–99)
Potassium: 2.6 mmol/L — CL (ref 3.5–5.1)
Sodium: 132 mmol/L — ABNORMAL LOW (ref 135–145)

## 2018-10-07 LAB — SARS CORONAVIRUS 2 BY RT PCR (HOSPITAL ORDER, PERFORMED IN ~~LOC~~ HOSPITAL LAB): SARS Coronavirus 2: NEGATIVE

## 2018-10-07 LAB — LACTIC ACID, PLASMA
Lactic Acid, Venous: 1.6 mmol/L (ref 0.5–1.9)
Lactic Acid, Venous: 1.2 mmol/L (ref 0.5–1.9)

## 2018-10-07 LAB — CBG MONITORING, ED
Glucose-Capillary: 131 mg/dL — ABNORMAL HIGH (ref 70–99)
Glucose-Capillary: 101 mg/dL — ABNORMAL HIGH (ref 70–99)

## 2018-10-07 MED ORDER — SODIUM CHLORIDE 0.9 % IV BOLUS
500.0000 mL | Freq: Once | INTRAVENOUS | Status: AC
  Administered 2018-10-07: 500 mL via INTRAVENOUS

## 2018-10-07 MED ORDER — POTASSIUM CHLORIDE CRYS ER 20 MEQ PO TBCR
20.0000 meq | EXTENDED_RELEASE_TABLET | Freq: Every day | ORAL | Status: DC
  Administered 2018-10-07 – 2018-10-11 (×5): 20 meq via ORAL
  Filled 2018-10-07 (×5): qty 1

## 2018-10-07 MED ORDER — LEVOTHYROXINE SODIUM 25 MCG PO TABS
25.0000 ug | ORAL_TABLET | Freq: Every day | ORAL | Status: DC
  Administered 2018-10-08 – 2018-10-11 (×4): 25 ug via ORAL
  Filled 2018-10-07 (×4): qty 1

## 2018-10-07 MED ORDER — TEMAZEPAM 15 MG PO CAPS
30.0000 mg | ORAL_CAPSULE | Freq: Every day | ORAL | Status: DC
  Administered 2018-10-07 – 2018-10-10 (×4): 30 mg via ORAL
  Filled 2018-10-07 (×4): qty 2

## 2018-10-07 MED ORDER — BUSPIRONE HCL 5 MG PO TABS
7.5000 mg | ORAL_TABLET | Freq: Three times a day (TID) | ORAL | Status: DC
  Administered 2018-10-08 – 2018-10-11 (×9): 7.5 mg via ORAL
  Filled 2018-10-07: qty 1.5
  Filled 2018-10-07 (×3): qty 2
  Filled 2018-10-07: qty 1.5
  Filled 2018-10-07 (×3): qty 2
  Filled 2018-10-07: qty 1.5
  Filled 2018-10-07 (×2): qty 2

## 2018-10-07 MED ORDER — POLYETHYLENE GLYCOL 3350 17 G PO PACK: 17.0000 g | PACK | Freq: Two times a day (BID) | ORAL | Status: DC | PRN

## 2018-10-07 MED ORDER — LINACLOTIDE 145 MCG PO CAPS
290.0000 ug | ORAL_CAPSULE | Freq: Every day | ORAL | Status: DC
  Administered 2018-10-08 – 2018-10-11 (×4): 290 ug via ORAL
  Filled 2018-10-07 (×4): qty 2

## 2018-10-07 MED ORDER — ACETAMINOPHEN 500 MG PO TABS
1000.0000 mg | ORAL_TABLET | Freq: Once | ORAL | Status: AC
  Administered 2018-10-07: 1000 mg via ORAL
  Filled 2018-10-07: qty 2

## 2018-10-07 MED ORDER — SENNOSIDES-DOCUSATE SODIUM 8.6-50 MG PO TABS: 1.0000 | ORAL_TABLET | Freq: Every evening | ORAL | Status: DC | PRN

## 2018-10-07 MED ORDER — POTASSIUM CHLORIDE CRYS ER 20 MEQ PO TBCR
40.0000 meq | EXTENDED_RELEASE_TABLET | Freq: Once | ORAL | Status: AC
  Administered 2018-10-07: 40 meq via ORAL
  Filled 2018-10-07: qty 2

## 2018-10-07 MED ORDER — ONDANSETRON HCL 4 MG PO TABS: 4.0000 mg | ORAL_TABLET | Freq: Four times a day (QID) | ORAL | Status: DC | PRN

## 2018-10-07 MED ORDER — ONDANSETRON HCL 4 MG/2ML IJ SOLN
4.0000 mg | Freq: Four times a day (QID) | INTRAMUSCULAR | Status: DC | PRN
  Administered 2018-10-11: 02:00:00 4 mg via INTRAVENOUS
  Filled 2018-10-07: qty 2

## 2018-10-07 MED ORDER — ACETAMINOPHEN 650 MG RE SUPP: 650.0000 mg | Freq: Four times a day (QID) | RECTAL | Status: DC | PRN

## 2018-10-07 MED ORDER — INSULIN ASPART 100 UNIT/ML ~~LOC~~ SOLN
0.0000 [IU] | Freq: Three times a day (TID) | SUBCUTANEOUS | Status: DC
  Administered 2018-10-07: 2 [IU] via SUBCUTANEOUS
  Administered 2018-10-09: 17:00:00 3 [IU] via SUBCUTANEOUS
  Filled 2018-10-07: qty 0.15

## 2018-10-07 MED ORDER — VITAMIN C 500 MG PO TABS
500.0000 mg | ORAL_TABLET | Freq: Every day | ORAL | Status: DC
  Administered 2018-10-08 – 2018-10-11 (×4): 500 mg via ORAL
  Filled 2018-10-07 (×4): qty 1

## 2018-10-07 MED ORDER — POTASSIUM CHLORIDE 10 MEQ/100ML IV SOLN
10.0000 meq | Freq: Once | INTRAVENOUS | Status: AC
  Administered 2018-10-07: 15:00:00 10 meq via INTRAVENOUS
  Filled 2018-10-07: qty 100

## 2018-10-07 MED ORDER — ADULT MULTIVITAMIN W/MINERALS CH
1.0000 | ORAL_TABLET | Freq: Every day | ORAL | Status: DC
  Administered 2018-10-07 – 2018-10-11 (×5): 1 via ORAL
  Filled 2018-10-07 (×5): qty 1

## 2018-10-07 MED ORDER — B COMPLEX-C PO TABS
1.0000 | ORAL_TABLET | Freq: Every day | ORAL | Status: DC
  Administered 2018-10-08 – 2018-10-11 (×4): 1 via ORAL
  Filled 2018-10-07 (×4): qty 1

## 2018-10-07 MED ORDER — METOPROLOL SUCCINATE ER 50 MG PO TB24
50.0000 mg | ORAL_TABLET | Freq: Two times a day (BID) | ORAL | Status: DC
  Administered 2018-10-08 – 2018-10-11 (×7): 50 mg via ORAL
  Filled 2018-10-07 (×8): qty 1

## 2018-10-07 MED ORDER — PREDNISONE 20 MG PO TABS
20.0000 mg | ORAL_TABLET | Freq: Every day | ORAL | Status: DC
  Administered 2018-10-08 – 2018-10-11 (×4): 20 mg via ORAL
  Filled 2018-10-07 (×4): qty 1

## 2018-10-07 MED ORDER — ANASTROZOLE 1 MG PO TABS
1.0000 mg | ORAL_TABLET | Freq: Every day | ORAL | Status: DC
  Administered 2018-10-08 – 2018-10-11 (×4): 1 mg via ORAL
  Filled 2018-10-07 (×4): qty 1

## 2018-10-07 MED ORDER — TRAMADOL HCL 50 MG PO TABS
50.0000 mg | ORAL_TABLET | Freq: Four times a day (QID) | ORAL | Status: DC | PRN
  Administered 2018-10-08 – 2018-10-11 (×2): 50 mg via ORAL
  Filled 2018-10-07 (×2): qty 1

## 2018-10-07 MED ORDER — DEXTROSE 5 % IV SOLN
10.0000 mg/kg | Freq: Two times a day (BID) | INTRAVENOUS | Status: DC
  Administered 2018-10-08 – 2018-10-09 (×3): 780 mg via INTRAVENOUS
  Filled 2018-10-07 (×4): qty 15.6

## 2018-10-07 MED ORDER — SODIUM CHLORIDE 0.9 % IV SOLN
INTRAVENOUS | Status: DC
  Administered 2018-10-07 – 2018-10-11 (×7): via INTRAVENOUS

## 2018-10-07 MED ORDER — DOCUSATE SODIUM 100 MG PO CAPS
100.0000 mg | ORAL_CAPSULE | Freq: Two times a day (BID) | ORAL | Status: DC
  Administered 2018-10-07 – 2018-10-11 (×8): 100 mg via ORAL
  Filled 2018-10-07 (×8): qty 1

## 2018-10-07 MED ORDER — ACETAMINOPHEN 325 MG PO TABS: 650.0000 mg | ORAL_TABLET | Freq: Four times a day (QID) | ORAL | Status: DC | PRN

## 2018-10-07 MED ORDER — DEXTROSE 5 % IV SOLN
10.0000 mg/kg | Freq: Once | INTRAVENOUS | Status: AC
  Administered 2018-10-07: 780 mg via INTRAVENOUS
  Filled 2018-10-07: qty 15.6

## 2018-10-07 MED ORDER — DILTIAZEM HCL ER COATED BEADS 120 MG PO CP24
120.0000 mg | ORAL_CAPSULE | Freq: Every day | ORAL | Status: DC
  Administered 2018-10-08 – 2018-10-11 (×4): 120 mg via ORAL
  Filled 2018-10-07 (×4): qty 1

## 2018-10-07 MED ORDER — PANTOPRAZOLE SODIUM 40 MG PO TBEC
40.0000 mg | DELAYED_RELEASE_TABLET | Freq: Every day | ORAL | Status: DC
  Administered 2018-10-07 – 2018-10-11 (×5): 40 mg via ORAL
  Filled 2018-10-07 (×5): qty 1

## 2018-10-07 MED ORDER — APIXABAN 5 MG PO TABS
5.0000 mg | ORAL_TABLET | Freq: Two times a day (BID) | ORAL | Status: DC
  Administered 2018-10-08 – 2018-10-11 (×7): 5 mg via ORAL
  Filled 2018-10-07 (×8): qty 1

## 2018-10-07 MED ORDER — TRAZODONE HCL 50 MG PO TABS
25.0000 mg | ORAL_TABLET | Freq: Every evening | ORAL | Status: DC | PRN
  Administered 2018-10-08 – 2018-10-10 (×3): 25 mg via ORAL
  Filled 2018-10-07 (×3): qty 1

## 2018-10-07 MED ORDER — VITAMIN D3 25 MCG (1000 UNIT) PO TABS
1000.0000 [IU] | ORAL_TABLET | Freq: Every day | ORAL | Status: DC
  Administered 2018-10-08 – 2018-10-11 (×4): 1000 [IU] via ORAL
  Filled 2018-10-07 (×4): qty 1

## 2018-10-07 MED ORDER — INSULIN ASPART 100 UNIT/ML ~~LOC~~ SOLN
0.0000 [IU] | Freq: Every day | SUBCUTANEOUS | Status: DC
  Filled 2018-10-07: qty 0.05

## 2018-10-07 MED ORDER — MULTI-VITAMIN/MINERALS PO TABS: 1.0000 | ORAL_TABLET | Freq: Every day | ORAL | Status: DC

## 2018-10-07 MED ORDER — FUROSEMIDE 40 MG PO TABS: 40.0000 mg | ORAL_TABLET | Freq: Every day | ORAL | Status: DC | PRN

## 2018-10-07 MED ORDER — ATORVASTATIN CALCIUM 10 MG PO TABS
10.0000 mg | ORAL_TABLET | Freq: Every evening | ORAL | Status: DC
  Administered 2018-10-08 – 2018-10-10 (×3): 10 mg via ORAL
  Filled 2018-10-07 (×4): qty 1

## 2018-10-07 MED ORDER — OXYBUTYNIN CHLORIDE 5 MG PO TABS
5.0000 mg | ORAL_TABLET | Freq: Two times a day (BID) | ORAL | Status: DC
  Administered 2018-10-07 – 2018-10-11 (×8): 5 mg via ORAL
  Filled 2018-10-07 (×8): qty 1

## 2018-10-07 NOTE — ED Provider Notes (Signed)
Care handoff received from Providence Lanius, PA-C at shift change please see her note for full details.  In short 85 year old female presenting today for shingles pain, fever, and feeling unwell.  Lactic 1.2 CBC nonacute BMP with hypokalemia COVID-19 negative Blood cultures pending Chest x-ray:    IMPRESSION:  Cardiomegaly. Lungs are clear.   EKG: Without acute findings reviewed by Dr. Laverta Baltimore - Patient given fluid bolus, potassium supplemented. Given fever without clear source, hypokalemia and zoster, there was concern for herpes zoster.  Pharmacy was consulted who recommended acyclovir 10 mg/kg.  Plan for admission, only pending test aside from blood culture as well as urinalysis.   Plan of care at shift change is to await UA, if negative admit for hypokalemia, fever and zoster; if positive treat with antibiotics and then admit for the same. Physical Exam  BP 134/81   Pulse 72   Temp 99 F (37.2 C) (Oral)   Resp (!) 21   SpO2 97%   Physical Exam Constitutional:      General: She is not in acute distress.    Appearance: Normal appearance. She is well-developed. She is not ill-appearing or diaphoretic.  HENT:     Head: Normocephalic and atraumatic.     Right Ear: External ear normal.     Left Ear: External ear normal.     Nose: Nose normal.  Eyes:     General: Vision grossly intact. Gaze aligned appropriately.     Pupils: Pupils are equal, round, and reactive to light.  Neck:     Musculoskeletal: Normal range of motion.     Trachea: Trachea and phonation normal. No tracheal deviation.  Pulmonary:     Effort: Pulmonary effort is normal. No respiratory distress.  Musculoskeletal: Normal range of motion.  Skin:    General: Skin is warm and dry.  Neurological:     Mental Status: She is alert.     GCS: GCS eye subscore is 4. GCS verbal subscore is 5. GCS motor subscore is 6.     Comments: Speech is clear and goal oriented, follows commands Major Cranial nerves without deficit,  no facial droop Moves extremities without ataxia, coordination intact  Psychiatric:        Behavior: Behavior normal.     ED Course/Procedures   Clinical Course as of Oct 06 1613  Thu Oct 07, 2018  1514 Follow urine then admit.   [BM]  1514 Acyclovir 10mg /kg per pharmacy   [BM]    Clinical Course User Index [BM] Deliah Boston, PA-C    Procedures  MDM  Urinalysis with 30 protein, otherwise within normal limits no sign of UTI.  Consult called to hospitalist for admission. - Discussed case with hospitalist service will be seeing patient in ED for admission. - Patient evaluated sleeping comfortably in bed no acute distress.  Easily arousable to voice.  She states understanding of care plan and is agreeable for admission.  She has no further concerns at this time. - Patient has been admitted to hospital service for further evaluation management.  Note: Portions of this report may have been transcribed using voice recognition software. Every effort was made to ensure accuracy; however, inadvertent computerized transcription errors may still be present.    Gari Crown 10/07/18 1627    Charlesetta Shanks, MD 10/07/18 [phone]

## 2018-10-07 NOTE — ED Notes (Signed)
TEMP RECHECK 99.0

## 2018-10-07 NOTE — ED Notes (Signed)
Pt placed in hospital bed and now resting

## 2018-10-07 NOTE — ED Provider Notes (Addendum)
[facility] DEPT Provider Note   CSN: [id number] Arrival date & time: 10/07/18  1040     History   Chief Complaint Chief Complaint  Patient presents with  . Herpes Zoster    HPI [name] is a 85 y.o. female brought in via EMS from [facility] independent living with complaints of shingles pain and "not feeling good."  Patient reports that she was diagnosed with shingles on Tuesday.  She reports that Monday, she had had some twinges of pain to the right side of her chest and then started breaking out to the rash.  She was prescribed antivirals which she states she has been taking.  She does not know she has been taking any other medications.  She states that she just feels like "she feels unwell" but has trouble specifying that.  She states she has not had any chest pain, difficulty breathing, abdominal pain, vomiting, urinary complaints.     The history is provided by the patient.    Past Medical History:  Diagnosis Date  . Anxiety    denies  . Atrial flutter ([place])   . Bilateral edema of lower extremity   . Breast cancer ([place])    left breast  . Chronic constipation   . Chronic diastolic CHF (congestive heart failure) ([place])   . Diverticulosis of colon   . GERD (gastroesophageal reflux disease)   . H/O hiatal hernia   . History of cellulitis    LEFT LOWER LEG --  SEPT 2015  . History of colitis    DX  2003  WITH ISCHEMIC COLITIS  . History of diverticulitis of colon    perferated diverticulitis w/ abscess 08-25-2013 drain placed --  resolved without surgical intervention  . History of GI bleed    2011--- UPPER SECONARY GASTRIC ULCER FROM NSAID USE  . History of Helicobacter pylori infection    2011  . History of ulcer of lower limb    2012   RIGHT LOWER EXTREMITIY--  STATSIS ULCER  . Hyperlipidemia   . Hypertension   . Hypothyroidism   . Iron deficiency anemia 2011   elevated MCV  . LBP (low back pain)    severe lumbar spondylosis  s/p L3/L4,L4/L5 fusion 12/10  . Lumbar spondylolysis   . OA (osteoarthritis)    "hands" (08/25/2013)  . Open wound of left lower leg   . Osteopenia   . Persistent atrial fibrillation    CARDIOLOGIST-   DR Burman Foster  . RBBB   . Urinary frequency     Patient Active Problem List   Diagnosis Date Noted  . Herpes zoster 10/07/2018  . Chronic osteomyelitis of left tibia ([place]) 05/23/2017  . Community acquired pneumonia of left lung   . Osteoporosis 08/22/2016  . Malignant neoplasm of upper-outer quadrant of left breast in female, estrogen receptor positive ([place]) 08/10/2015  . Breast mass in female 07/25/2015  . Left breast mass 07/05/2015  . Incontinence in female 07/05/2014  . Edema 01/17/2014  . Morbilliform rash 09/06/2013  . Sepsis ([place]) 08/25/2013  . Metabolic acidosis 99991111  . Diverticulitis of colon with perforation 08/25/2013  . Constipation 06/22/2013  . Open tibial fracture 06/17/2013  . Encounter for therapeutic drug monitoring 02/16/2013  . Well adult exam 07/21/2012  . Paresthesia 01/28/2012  . Chronic venous insufficiency 11/11/2010  . Actinic keratoses 05/30/2010  . Long term current use of anticoagulant 03/12/2010  . PEPTIC ULCER DISEASE 11/22/2009  . Iron deficiency anemia 10/31/2009  . HIP  PAIN 10/31/2009  . Hypothyroidism 10/25/2009  . Disorder resulting from impaired renal function 03/19/2009  . INSOMNIA, PERSISTENT 11/07/2008  . Low back pain 09/04/2008  . Atrial flutter ([place]) 05/31/2008  . Essential hypertension 11/30/2006  . Osteoarthritis 11/30/2006  . Disorder of bone and cartilage 11/30/2006    Past Surgical History:  Procedure Laterality Date  . APPLICATION OF A-CELL OF EXTREMITY Left 06/21/2013   Procedure: APPLICATION OF A-CELL OF EXTREMITY;  Surgeon: Theodoro Kos, DO;  Location: [facility];  Service: Plastics;  Laterality: Left;  . APPLICATION OF A-CELL OF EXTREMITY Left 08/22/2013   Procedure: APPLICATION OF A-CELL/VAC TO LOWER LEFT LEG WOUND;   Surgeon: Theodoro Kos, DO;  Location: [facility];  Service: Plastics;  Laterality: Left;  . APPLICATION OF A-CELL OF EXTREMITY Left 02/06/2014   Procedure: WITH PLACEMENT OF A -CELL ;  Surgeon: Theodoro Kos, DO;  Location: [facility];  Service: Plastics;  Laterality: Left;  . APPLICATION OF A-CELL OF EXTREMITY Left 08/09/2014   Procedure: PLACEMENT OF A CELL;  Surgeon: Theodoro Kos, DO;  Location: [place];  Service: Plastics;  Laterality: Left;  . APPLICATION OF WOUND VAC Left 06/16/2013   Procedure: APPLICATION OF WOUND VAC;  Surgeon: Meredith Pel, MD;  Location: [place];  Service: Orthopedics;  Laterality: Left;  . APPLICATION OF WOUND VAC Left 06/21/2013   Procedure: APPLICATION OF WOUND VAC;  Surgeon: Theodoro Kos, DO;  Location: [facility];  Service: Plastics;  Laterality: Left;  . BIOPSY BREAST  07/25/2015   LEFT RADIOACTIVE SEED GUIDED EXCISIONAL BREAST BIOPSY (Left) as a surgical intervention  . EXTERNAL FIXATION LEG Left 06/16/2013   Procedure: EXTERNAL FIXATION LEG;  Surgeon: Meredith Pel, MD;  Location: [place] [facility];  Service: Orthopedics;  Laterality: Left;  . EXTERNAL FIXATION REMOVAL Left 06/21/2013   Procedure: REMOVAL EXTERNAL FIXATION LEG;  Surgeon: Meredith Pel, MD;  Location: [facility];  Service: Orthopedics;  Laterality: Left;  . EYE SURGERY Bilateral    cataract removal  . HARDWARE REMOVAL Left 05/23/2014   Procedure: HARDWARE REMOVAL;  Surgeon: Meredith Pel, MD;  Location: [place];  Service: Orthopedics;  Laterality: Left;  REMOVAL OF HARDWARE LEFT TIBIA  . I&D EXTREMITY Left 06/16/2013   Procedure: IRRIGATION AND DEBRIDEMENT EXTREMITY;  Surgeon: Meredith Pel, MD;  Location: [employer];  Service: Orthopedics;  Laterality: Left;  . I&D EXTREMITY Left 02/06/2014   Procedure: IRRIGATION AND DEBRIDEMENT LEFT LEG WOUND ;  Surgeon: Theodoro Kos, DO;  Location: [place] [facility];  Service: Plastics;  Laterality: Left;  . I&D EXTREMITY Left  08/09/2014   Procedure: IRRIGATION AND DEBRIDEMENT  OF LEFT LOWER LEG;  Surgeon: Theodoro Kos, DO;  Location: [place] [facility];  Service: Plastics;  Laterality: Left;  . LUMBAR FUSION  01-02-2009   L3-L5  . RADIOACTIVE SEED GUIDED EXCISIONAL BREAST BIOPSY Left 07/25/2015   Procedure: LEFT RADIOACTIVE SEED GUIDED EXCISIONAL BREAST BIOPSY;  Surgeon: Rolm Bookbinder, MD;  Location: [facility];  Service: General;  Laterality: Left;  . RE-EXCISION OF BREAST LUMPECTOMY Left 09/05/2015   Procedure: RE-EXCISION OF LEFT BREAST LUMPECTOMY;  Surgeon: Rolm Bookbinder, MD;  Location: [facility];  Service: General;  Laterality: Left;  . SHOULDER OPEN ROTATOR CUFF REPAIR Left 1997  . TIBIA IM NAIL INSERTION Left 06/21/2013   Procedure: INTRAMEDULLARY (IM) NAIL TIBIAL;  Surgeon: Meredith Pel, MD;  Location: [facility];  Service: Orthopedics;  Laterality: Left;  . TONSILLECTOMY  AS CHILD  . TOTAL HIP ARTHROPLASTY Right  06-07-2010  . TRANSTHORACIC ECHOCARDIOGRAM  09-11-2010   DR MCALHANY   LVSF  55-60%/  MILD MV CALCIFICATION WITH NO STENOSIS/  MILD MR & TR / MODERATE LAE/  MODERATE TO SEVERE RAE  . UPPER GI ENDOSCOPY  03-29-2010     OB History   No obstetric history on file.      Home Medications    Prior to Admission medications   Medication Sig Start Date End Date Taking? Authorizing Provider  acetaminophen (TYLENOL) 500 MG tablet Take 500 mg by mouth daily as needed for mild pain.    Yes [provider]  anastrozole (ARIMIDEX) 1 MG tablet Take 1 tablet (1 mg total) by mouth daily. Patient taking differently: Take 1 mg by mouth at bedtime.  09/08/18  Yes Magrinat, Virgie Dad, MD  apixaban (ELIQUIS) 5 MG TABS tablet Take 1 tablet (5 mg total) by mouth 2 (two) times daily. 11/17/17  Yes Burnell Blanks, MD  atorvastatin (LIPITOR) 10 MG tablet Take 1 tablet (10 mg total) by mouth every evening. 05/27/18  Yes [name], [name], MD  B Complex-C (B-COMPLEX WITH VITAMIN C) tablet Take 1 tablet by mouth daily.    Yes [provider]  busPIRone (BUSPAR) 7.5 MG tablet Take 1 tablet (7.5 mg total) by mouth 3 (three) times daily. 08/17/18  Yes [name], [name], MD  Cholecalciferol (VITAMIN D3) 1000 UNITS tablet Take 1,000 Units by mouth daily.     Yes [provider]  diltiazem (CARDIZEM CD) 120 MG 24 hr capsule TAKE 1 CAPSULE (120 MG TOTAL) BY MOUTH DAILY. Patient taking differently: Take 120 mg by mouth daily.  07/15/18  Yes [name], [name], MD  furosemide (LASIX) 40 MG tablet Take 40 mg by mouth daily as needed for fluid or edema.    Yes [provider]  levothyroxine (SYNTHROID) 25 MCG tablet TAKE 1 TABLET BY MOUTH DAILY FOR THYROID. Patient taking differently: Take 25 mcg by mouth daily before breakfast.  08/12/18  Yes [name], [name], MD  linaclotide (LINZESS) 290 MCG CAPS capsule Take 1 capsule (290 mcg total) by mouth daily. 11/10/17  Yes [name], [name], MD  metoprolol succinate (TOPROL-XL) 50 MG 24 hr tablet Take 1 tablet (50 mg total) by mouth 2 (two) times daily. Take with or immediately following a meal. 04/26/18  Yes [name], [name], MD  Multiple Vitamins-Minerals (MULTIVITAMIN WITH MINERALS) tablet Take 1 tablet by mouth daily.   Yes [provider]  oxybutynin (DITROPAN) 5 MG tablet Take 1 tablet (5 mg total) by mouth 2 (two) times daily. 07/14/18  Yes [name], [name], MD  polyethylene glycol powder (GLYCOLAX/MIRALAX) powder Take 17-34 g by mouth 2 (two) times daily as needed for moderate constipation. 07/03/16  Yes [name], [name], MD  potassium chloride SA (K-DUR) 20 MEQ tablet Take 1 tablet by mouth only when you have to use a lasix 08/26/18  Yes Dunn, Dayna N, PA-C  predniSONE (DELTASONE) 20 MG tablet Take 1 tablet (20 mg total) by mouth daily with breakfast. 10/04/18  Yes Marrian Salvage, FNP  temazepam (RESTORIL) 30 MG capsule Take 1 capsule (30 mg total) by mouth at bedtime. 05/18/18  Yes [name], [name], MD   valACYclovir (VALTREX) 1000 MG tablet Take 1 tablet (1,000 mg total) by mouth 3 (three) times daily. 10/04/18  Yes Marrian Salvage, FNP  vitamin C (ASCORBIC ACID) 500 MG tablet Take 500 mg by mouth daily.   Yes [provider]  zinc sulfate (  ZINC-220) 220 (50 Zn) MG capsule Take 220 mg by mouth daily.   Yes [provider]    Family History Family History  Problem Relation Age of Onset  . Cancer Father   . Hypertension Other     Social History Social History   Tobacco Use  . Smoking status: Never Smoker  . Smokeless tobacco: Never Used  Substance Use Topics  . Alcohol use: No    Comment: 1 glass occ wine 08/03/14- none in a year  . Drug use: No     Allergies   Zosyn [piperacillin sod-tazobactam so], Atenolol, Clarithromycin, Codeine sulfate, Levaquin [levofloxacin], Macrodantin, Oxycodone-acetaminophen, and Percocet [oxycodone-acetaminophen]   Review of Systems Review of Systems  Constitutional: Negative for fever.  Respiratory: Negative for cough and shortness of breath.   Cardiovascular: Negative for chest pain.  Gastrointestinal: Negative for abdominal pain, nausea and vomiting.  Genitourinary: Negative for dysuria and hematuria.  Skin: Positive for rash.  Neurological: Negative for headaches.  All other systems reviewed and are negative.    Physical Exam Updated Vital Signs BP (!) 117/57 (BP Location: Right Arm)   Pulse 65   Temp 97.8 F (36.6 C) (Oral)   Resp 16   Ht 5' 5.5" (1.664 m)   Wt 74.3 kg   SpO2 96%   BMI 26.84 kg/m   Physical Exam Vitals signs and nursing note reviewed.  Constitutional:      Appearance: Normal appearance. She is well-developed.  HENT:     Head: Normocephalic and atraumatic.  Eyes:     General: Lids are normal.     Conjunctiva/sclera: Conjunctivae normal.     Pupils: Pupils are equal, round, and reactive to light.     Comments: PERRL. EOMs intact. No nystagmus. No neglect.   Neck:      Musculoskeletal: Full passive range of motion without pain.  Cardiovascular:     Rate and Rhythm: Normal rate and regular rhythm.     Pulses: Normal pulses.     Heart sounds: Normal heart sounds. No murmur. No friction rub. No gallop.   Pulmonary:     Effort: Pulmonary effort is normal.     Breath sounds: Normal breath sounds.     Comments: Lungs clear to auscultation bilaterally.  Symmetric chest rise.  No wheezing, rales, rhonchi. Chest:       Comments: Scattered erythematous vesicular lesions noted to T4 distribution that extends over the right breast, right lateral chest wall and extend over to the back.  No other rash. Abdominal:     Palpations: Abdomen is soft. Abdomen is not rigid.     Tenderness: There is no abdominal tenderness. There is no guarding.  Musculoskeletal: Normal range of motion.  Skin:    General: Skin is warm and dry.     Capillary Refill: Capillary refill takes less than 2 seconds.  Neurological:     Mental Status: She is alert.     Comments: Follows commands, Moves all extremities  5/5 strength to BUE and BLE  Sensation intact throughout all major nerve distributions Alert and oriented x 2.   Psychiatric:        Speech: Speech normal.      ED Treatments / Results  Labs (all labs ordered are listed, but only abnormal results are displayed) Labs Reviewed  BASIC METABOLIC PANEL - Abnormal; Notable for the following components:      Result Value   Sodium 132 (*)    Potassium 2.6 (*)    Chloride  90 (*)    Glucose, Bld 111 (*)    Creatinine, Ser 1.12 (*)    Calcium 8.5 (*)    GFR calc non Af Amer 43 (*)    GFR calc Af Amer 50 (*)    All other components within normal limits  CBC WITH DIFFERENTIAL/PLATELET - Abnormal; Notable for the following components:   MCV 103.9 (*)    MCH 34.6 (*)    All other components within normal limits  URINALYSIS, ROUTINE W REFLEX MICROSCOPIC - Abnormal; Notable for the following components:   Protein, ur 30 (*)     All other components within normal limits  CBC WITH DIFFERENTIAL/PLATELET - Abnormal; Notable for the following components:   MCV 102.8 (*)    MCH 34.7 (*)    All other components within normal limits  HEMOGLOBIN A1C - Abnormal; Notable for the following components:   Hgb A1c MFr Bld 5.9 (*)    All other components within normal limits  BASIC METABOLIC PANEL - Abnormal; Notable for the following components:   Sodium 134 (*)    Potassium 2.5 (*)    Chloride 95 (*)    Calcium 8.0 (*)    All other components within normal limits  GLUCOSE, CAPILLARY - Abnormal; Notable for the following components:   Glucose-Capillary 115 (*)    All other components within normal limits  GLUCOSE, CAPILLARY - Abnormal; Notable for the following components:   Glucose-Capillary 145 (*)    All other components within normal limits  BASIC METABOLIC PANEL - Abnormal; Notable for the following components:   Potassium 3.0 (*)    Calcium 7.7 (*)    All other components within normal limits  CBC WITH DIFFERENTIAL/PLATELET - Abnormal; Notable for the following components:   MCV 101.6 (*)    MCH 34.6 (*)    All other components within normal limits  CBG MONITORING, ED - Abnormal; Notable for the following components:   Glucose-Capillary 131 (*)    All other components within normal limits  CBG MONITORING, ED - Abnormal; Notable for the following components:   Glucose-Capillary 101 (*)    All other components within normal limits  CBG MONITORING, ED - Abnormal; Notable for the following components:   Glucose-Capillary 111 (*)    All other components within normal limits  SARS CORONAVIRUS 2 (HOSPITAL ORDER, [facility])  CULTURE, BLOOD (ROUTINE X 2)  CULTURE, BLOOD (ROUTINE X 2)  MRSA PCR SCREENING  LACTIC ACID, PLASMA  LACTIC ACID, PLASMA  GLUCOSE, CAPILLARY  GLUCOSE, CAPILLARY  CBG MONITORING, ED    EKG EKG Interpretation  Date/Time:  Thursday October 07 2018 14:06:29  EDT Ventricular Rate:  77 PR Interval:    QRS Duration: 155 QT Interval:  423 QTC Calculation: 479 R Axis:   54 Text Interpretation:  Atrial fibrillation Right bundle branch block SImilar to prior. No STEMI  Confirmed by Nanda Quinton [phone]) on 10/07/2018 2:10:21 PM   Radiology No results found.  Procedures Procedures (including critical care time)  Medications Ordered in ED Medications  anastrozole (ARIMIDEX) tablet 1 mg (1 mg Oral Given 10/09/18 0921)  atorvastatin (LIPITOR) tablet 10 mg (10 mg Oral Given 10/08/18 1805)  diltiazem (CARDIZEM CD) 24 hr capsule 120 mg (120 mg Oral Given 10/09/18 0924)  furosemide (LASIX) tablet 40 mg (has no administration in time range)  metoprolol succinate (TOPROL-XL) 24 hr tablet 50 mg (50 mg Oral Given 10/09/18 0925)  busPIRone (BUSPAR) tablet 7.5 mg (7.5 mg Oral  Given 10/09/18 0924)  temazepam (RESTORIL) capsule 30 mg (30 mg Oral Given 10/08/18 2232)  levothyroxine (SYNTHROID) tablet 25 mcg (25 mcg Oral Given 10/09/18 0601)  predniSONE (DELTASONE) tablet 20 mg (20 mg Oral Given 10/09/18 0923)  linaclotide (LINZESS) capsule 290 mcg (290 mcg Oral Given 10/09/18 0922)  pantoprazole (PROTONIX) EC tablet 40 mg (40 mg Oral Given 10/09/18 0922)  polyethylene glycol (MIRALAX / GLYCOLAX) packet 17-34 g (has no administration in time range)  oxybutynin (DITROPAN) tablet 5 mg (5 mg Oral Given 10/09/18 0923)  apixaban (ELIQUIS) tablet 5 mg (5 mg Oral Given 10/09/18 0925)  B-complex with vitamin C tablet 1 tablet (1 tablet Oral Given 10/09/18 0923)  cholecalciferol (VITAMIN D) tablet 1,000 Units (1,000 Units Oral Given 10/09/18 0922)  potassium chloride SA (K-DUR) CR tablet 20 mEq (20 mEq Oral Given 10/09/18 0923)  vitamin C (ASCORBIC ACID) tablet 500 mg (500 mg Oral Given 10/09/18 0923)  0.9 %  sodium chloride infusion ( Intravenous Rate/Dose Verify 10/09/18 0600)  acetaminophen (TYLENOL) tablet 650 mg (has no administration in time range)    Or  acetaminophen  (TYLENOL) suppository 650 mg (has no administration in time range)  traMADol (ULTRAM) tablet 50 mg (50 mg Oral Given 10/08/18 2233)  traZODone (DESYREL) tablet 25 mg (25 mg Oral Given 10/08/18 2232)  docusate sodium (COLACE) capsule 100 mg (100 mg Oral Given 10/09/18 0925)  senna-docusate (Senokot-S) tablet 1 tablet (has no administration in time range)  ondansetron (ZOFRAN) tablet 4 mg (has no administration in time range)    Or  ondansetron (ZOFRAN) injection 4 mg (has no administration in time range)  multivitamin with minerals tablet 1 tablet (1 tablet Oral Given 10/09/18 0924)  insulin aspart (novoLOG) injection 0-15 Units (0 Units Subcutaneous Not Given 10/09/18 0939)  insulin aspart (novoLOG) injection 0-5 Units (0 Units Subcutaneous Not Given 10/08/18 2230)  acyclovir (ZOVIRAX) 780 mg in dextrose 5 % 150 mL IVPB (780 mg Intravenous New Bag/Given 10/09/18 0600)  potassium chloride SA (K-DUR) CR tablet 40 mEq (40 mEq Oral Given 10/09/18 1159)  acetaminophen (TYLENOL) tablet 1,000 mg (1,000 mg Oral Given 10/07/18 1220)  sodium chloride 0.9 % bolus 500 mL (0 mLs Intravenous Stopped 10/07/18 1608)  potassium chloride 10 mEq in 100 mL IVPB (0 mEq Intravenous Stopped 10/07/18 1616)  potassium chloride SA (K-DUR) CR tablet 40 mEq (40 mEq Oral Given 10/07/18 1515)  acyclovir (ZOVIRAX) 780 mg in dextrose 5 % 150 mL IVPB (0 mg/kg  78 kg Intravenous Stopped 10/07/18 2038)  potassium chloride SA (K-DUR) CR tablet 40 mEq (40 mEq Oral Given 10/08/18 0738)  potassium chloride 10 mEq in 100 mL IVPB (10 mEq Intravenous New Bag/Given 10/08/18 0937)     Initial Impression / Assessment and Plan / ED Course  I have reviewed the triage vital signs and the nursing notes.  Pertinent labs & imaging results that were available during my care of the patient were reviewed by me and considered in my medical decision making (see chart for details).  Clinical Course as of Oct 09 1302  Thu Oct 07, 2018  1514 Follow urine  then admit.   [BM]  1514 Acyclovir 10mg /kg per pharmacy   [BM]    Clinical Course User Index [BM] Deliah Boston, PA-C       85 year old female who presents for evaluation of feeling unwell.  Was recently diagnosed with shingles and started on antivirals.  Comes in today because she does not feel good.  No chest pain, difficulty  breathing, abdominal pain, urinary complaints.  Initial ED arrival, she is febrile, appears uncomfortable with no acute distress.  Vitals otherwise stable.  On exam, no evidence of rales on lung auscultation.  Abdomen exam benign.  She does have evidence of a vesicular rash noted to the T4 dermatome distribution consistent with shingles.  Question if this is disseminated herpes zoster.  Plan to check labs, chest x-ray, urine.  COVID is negative.  BMP shows hypokalemia of 2.6, creatinine of 1.12.  CBC shows no leukocytosis or anemia.  Chest x-ray shows stable cardiomegaly.  No evidence of acute infectious etiology.  COVID test is negative.  Given hypokalemia, repletion was given.  Given concerns for fever with no infectious source, question of this is disseminated herpes zoster.  We will plan to start her on IV antivirals.  Patient is in no signs of distress and is able to clearly communicate without any signs of AMS.  Indication that she needs an LP at this time.   Discussed with Lilia Pro with pharmacy.  Pharmacy recommends doing acyclovir of 10 mg/kg.   Given hypokalemia, fever, zoster, will plan for admission.   Patient signed out to Nuala Alpha, PA-C with urine pending to ensure that her febrile status is not from UTI.  If UA is unremarkable, will plan for admission given hypokalemia, fever, zoster.  If UA shows suspicions for UTI, plan for IV antibiotics and admit.  Portions of this note were generated with [employer]. Dictation errors may occur despite best attempts at proofreading.   Final Clinical Impressions(s) / ED Diagnoses   Final  diagnoses:  Hypokalemia  Disseminated herpes zoster    ED Discharge Orders    None       Volanda Napoleon, PA-C 10/07/18 1521    Volanda Napoleon, PA-C 10/09/18 1304    Long, Wonda Old[facility], MD 10/18/18 [phone]

## 2018-10-07 NOTE — Progress Notes (Signed)
Pharmacy Antibiotic Note  BALEY ALTHEIDE is a 85 y.o. female admitted on 10/07/2018 with herpes zoster.  Pharmacy has been consulted for acyclovir dosing.  Plan: Acyclovir 10mg /kg IV q12 for current CrCl normalized of 37  Weight: 172 lb (78 kg)  Temp (24hrs), Avg:100.6 F (38.1 C), Min:99 F (37.2 C), Max:102.1 F (38.9 C)  Recent Labs  Lab 10/07/18 1219 10/07/18 1220 10/07/18 1524  WBC  --  6.7  --   CREATININE  --  1.12*  --   LATICACIDVEN 1.6  --  1.2    Estimated Creatinine Clearance: 34.5 mL/min (A) (by C-G formula based on SCr of 1.12 mg/dL (H)).    Allergies  Allergen Reactions  . Zosyn [Piperacillin Sod-Tazobactam So] Hives, Rash and Other (See Comments)    Morbilliform eruption with itching  . Atenolol Nausea And Vomiting  . Clarithromycin Nausea And Vomiting  . Codeine Sulfate Nausea Only  . Levaquin [Levofloxacin] Nausea Only  . Macrodantin Other (See Comments)    UNSPECIFIED   . Oxycodone-Acetaminophen Nausea And Vomiting  . Percocet [Oxycodone-Acetaminophen] Nausea And Vomiting     Thank you for allowing pharmacy to be a part of this patient's care.  Kara Mead 10/07/2018 5:02 PM

## 2018-10-07 NOTE — ED Notes (Signed)
BLADDER SCAN RESULTED 550

## 2018-10-07 NOTE — ED Notes (Signed)
PureWick placed.

## 2018-10-07 NOTE — H&P (Signed)
History and Physical    KHALI STARKOVICH [id number] DOB: [birth year] DOA: 10/07/2018  PCP: [name], MD  Patient coming from: Independent living [place]  I have personally briefly reviewed patient's old medical records in [place]  Chief Complaint: right beast rash  HPI: ALMEDIA UPLINGER is a 85 y.o. female with medical history significant for HTN, HLD, DM, Left breast cancer who presents by EMS from [facility] independent living secondary to right breast rash consistent with shingles. Pt is a poor historian but reported a several day hx of "stinging" sensation 8/10 in her right breast and was seen by the provider there. She indicated she had a "rash" and had been feeling fatigued overall for several days.  She was diagnosed with shingles and sent to the ER for further eval.  ED Course: Tm 102.1, wbc 6.7, k 2.6 bun/cr 18/1.12, cxr clear for acute process but positive cardiomegaly, lactic acid 1.6 > 1.2, discussed case with pharmacy who recommended IV acyclovir 10mg /kg, skin findings c/w zoster  Review of Systems: As per HPI otherwise 10 point review of systems done notable for stinging rash, and gen fatigue all other reviewed and are negative Past Medical History:  Diagnosis Date  . Anxiety    denies  . Atrial flutter ([place])   . Bilateral edema of lower extremity   . Breast cancer ([place])    left breast  . Chronic constipation   . Chronic diastolic CHF (congestive heart failure) ([place])   . Diverticulosis of colon   . GERD (gastroesophageal reflux disease)   . H/O hiatal hernia   . History of cellulitis    LEFT LOWER LEG --  SEPT 2015  . History of colitis    DX  2003  WITH ISCHEMIC COLITIS  . History of diverticulitis of colon    perferated diverticulitis w/ abscess 08-25-2013 drain placed --  resolved without surgical intervention  . History of GI bleed    2011--- UPPER SECONARY GASTRIC ULCER FROM NSAID USE  . History of Helicobacter pylori infection    2011  .  History of ulcer of lower limb    2012   RIGHT LOWER EXTREMITIY--  STATSIS ULCER  . Hyperlipidemia   . Hypertension   . Hypothyroidism   . Iron deficiency anemia 2011   elevated MCV  . LBP (low back pain)    severe lumbar spondylosis s/p L3/L4,L4/L5 fusion 12/10  . Lumbar spondylolysis   . OA (osteoarthritis)    "hands" (08/25/2013)  . Open wound of left lower leg   . Osteopenia   . Persistent atrial fibrillation    CARDIOLOGIST-   DR Burman Foster  . RBBB   . Urinary frequency     Past Surgical History:  Procedure Laterality Date  . APPLICATION OF A-CELL OF EXTREMITY Left 06/21/2013   Procedure: APPLICATION OF A-CELL OF EXTREMITY;  Surgeon: Theodoro Kos, DO;  Location: [place];  Service: Plastics;  Laterality: Left;  . APPLICATION OF A-CELL OF EXTREMITY Left 08/22/2013   Procedure: APPLICATION OF A-CELL/VAC TO LOWER LEFT LEG WOUND;  Surgeon: Theodoro Kos, DO;  Location: [place];  Service: Plastics;  Laterality: Left;  . APPLICATION OF A-CELL OF EXTREMITY Left 02/06/2014   Procedure: WITH PLACEMENT OF A -CELL ;  Surgeon: Theodoro Kos, DO;  Location: [place];  Service: Plastics;  Laterality: Left;  . APPLICATION OF A-CELL OF EXTREMITY Left 08/09/2014   Procedure: PLACEMENT OF A CELL;  Surgeon: Theodoro Kos, DO;  Location:  [facility];  Service: Plastics;  Laterality: Left;  . APPLICATION OF WOUND VAC Left 06/16/2013   Procedure: APPLICATION OF WOUND VAC;  Surgeon: Meredith Pel, MD;  Location: [place];  Service: Orthopedics;  Laterality: Left;  . APPLICATION OF WOUND VAC Left 06/21/2013   Procedure: APPLICATION OF WOUND VAC;  Surgeon: Theodoro Kos, DO;  Location: [place];  Service: Plastics;  Laterality: Left;  . BIOPSY BREAST  07/25/2015   LEFT RADIOACTIVE SEED GUIDED EXCISIONAL BREAST BIOPSY (Left) as a surgical intervention  . EXTERNAL FIXATION LEG Left 06/16/2013   Procedure: EXTERNAL FIXATION LEG;  Surgeon: Meredith Pel, MD;  Location: [place];  Service:  Orthopedics;  Laterality: Left;  . EXTERNAL FIXATION REMOVAL Left 06/21/2013   Procedure: REMOVAL EXTERNAL FIXATION LEG;  Surgeon: Meredith Pel, MD;  Location: [place];  Service: Orthopedics;  Laterality: Left;  . EYE SURGERY Bilateral    cataract removal  . HARDWARE REMOVAL Left 05/23/2014   Procedure: HARDWARE REMOVAL;  Surgeon: Meredith Pel, MD;  Location: [place];  Service: Orthopedics;  Laterality: Left;  REMOVAL OF HARDWARE LEFT TIBIA  . I&D EXTREMITY Left 06/16/2013   Procedure: IRRIGATION AND DEBRIDEMENT EXTREMITY;  Surgeon: Meredith Pel, MD;  Location: [place];  Service: Orthopedics;  Laterality: Left;  . I&D EXTREMITY Left 02/06/2014   Procedure: IRRIGATION AND DEBRIDEMENT LEFT LEG WOUND ;  Surgeon: Theodoro Kos, DO;  Location: [employer];  Service: Plastics;  Laterality: Left;  . I&D EXTREMITY Left 08/09/2014   Procedure: IRRIGATION AND DEBRIDEMENT  OF LEFT LOWER LEG;  Surgeon: Theodoro Kos, DO;  Location: [place];  Service: Plastics;  Laterality: Left;  . LUMBAR FUSION  01-02-2009   L3-L5  . RADIOACTIVE SEED GUIDED EXCISIONAL BREAST BIOPSY Left 07/25/2015   Procedure: LEFT RADIOACTIVE SEED GUIDED EXCISIONAL BREAST BIOPSY;  Surgeon: Rolm Bookbinder, MD;  Location: [place];  Service: General;  Laterality: Left;  . RE-EXCISION OF BREAST LUMPECTOMY Left 09/05/2015   Procedure: RE-EXCISION OF LEFT BREAST LUMPECTOMY;  Surgeon: Rolm Bookbinder, MD;  Location: [place];  Service: General;  Laterality: Left;  . SHOULDER OPEN ROTATOR CUFF REPAIR Left 1997  . TIBIA IM NAIL INSERTION Left 06/21/2013   Procedure: INTRAMEDULLARY (IM) NAIL TIBIAL;  Surgeon: Meredith Pel, MD;  Location: [place];  Service: Orthopedics;  Laterality: Left;  . TONSILLECTOMY  AS CHILD  . TOTAL HIP ARTHROPLASTY Right 06-07-2010  . TRANSTHORACIC ECHOCARDIOGRAM  09-11-2010   DR MCALHANY   LVSF  55-60%/  MILD MV CALCIFICATION WITH NO STENOSIS/  MILD MR & TR / MODERATE LAE/  MODERATE TO SEVERE RAE  .  UPPER GI ENDOSCOPY  03-29-2010     reports that she has never smoked. She has never used smokeless tobacco. She reports that she does not drink alcohol or use drugs.  Allergies  Allergen Reactions  . Zosyn [Piperacillin Sod-Tazobactam So] Hives, Rash and Other (See Comments)    Morbilliform eruption with itching  . Atenolol Nausea And Vomiting  . Clarithromycin Nausea And Vomiting  . Codeine Sulfate Nausea Only  . Levaquin [Levofloxacin] Nausea Only  . Macrodantin Other (See Comments)    UNSPECIFIED   . Oxycodone-Acetaminophen Nausea And Vomiting  . Percocet [Oxycodone-Acetaminophen] Nausea And Vomiting    Family History  Problem Relation Age of Onset  . Cancer Father   . Hypertension Other      Prior to Admission medications   Medication Sig Start Date End Date Taking? Authorizing Provider  acetaminophen (TYLENOL) 500  MG tablet Take 500 mg by mouth daily as needed for mild pain.     [provider]  anastrozole (ARIMIDEX) 1 MG tablet Take 1 tablet (1 mg total) by mouth daily. 09/08/18   Magrinat, Virgie Dad, MD  apixaban (ELIQUIS) 5 MG TABS tablet Take 1 tablet (5 mg total) by mouth 2 (two) times daily. 11/17/17   Burnell Blanks, MD  atorvastatin (LIPITOR) 10 MG tablet Take 1 tablet (10 mg total) by mouth every evening. 05/27/18   [name], [name], MD  B Complex-C (B-COMPLEX WITH VITAMIN C) tablet Take 1 tablet by mouth daily.    [provider]  busPIRone (BUSPAR) 7.5 MG tablet Take 1 tablet (7.5 mg total) by mouth 3 (three) times daily. 08/17/18   [name], [name], MD  Cholecalciferol (VITAMIN D3) 1000 UNITS tablet Take 1,000 Units by mouth daily.      [provider]  diltiazem (CARDIZEM CD) 120 MG 24 hr capsule TAKE 1 CAPSULE (120 MG TOTAL) BY MOUTH DAILY. Patient taking differently: Take 120 mg by mouth daily.  07/15/18   [name], [name], MD  furosemide (LASIX) 40 MG tablet Take 40 mg by mouth daily as needed.    [provider]  levothyroxine (SYNTHROID) 25 MCG tablet TAKE 1 TABLET BY MOUTH DAILY FOR THYROID. Patient taking differently: Take 25 mcg by mouth daily before breakfast.  08/12/18   [name], [name], MD  linaclotide (LINZESS) 290 MCG CAPS capsule Take 1 capsule (290 mcg total) by mouth daily. 11/10/17   [name], [name], MD  metoprolol succinate (TOPROL-XL) 50 MG 24 hr tablet Take 1 tablet (50 mg total) by mouth 2 (two) times daily. Take with or immediately following a meal. 04/26/18   [name], [name], MD  Multiple Vitamins-Minerals (MULTIVITAMIN WITH MINERALS) tablet Take 1 tablet by mouth daily.    [provider]  omeprazole (PRILOSEC) 20 MG capsule Take 20 mg by mouth daily as needed (acid reflux).     [provider]  oxybutynin (DITROPAN) 5 MG tablet Take 1 tablet (5 mg total) by mouth 2 (two) times daily. 07/14/18   [name], [name], MD  polyethylene glycol powder (GLYCOLAX/MIRALAX) powder Take 17-34 g by mouth 2 (two) times daily as needed for moderate constipation. 07/03/16   [name], [name], MD  potassium chloride SA (K-DUR) 20 MEQ tablet Take 1 tablet by mouth only when you have to use a lasix 08/26/18   Dunn, Nedra Hai, PA-C  predniSONE (DELTASONE) 20 MG tablet Take 1 tablet (20 mg total) by mouth daily with breakfast. 10/04/18   Marrian Salvage, FNP  temazepam (RESTORIL) 30 MG capsule Take 1 capsule (30 mg total) by mouth at bedtime. 05/18/18   [name], [name], MD  valACYclovir (VALTREX) 1000 MG tablet Take 1 tablet (1,000 mg total) by mouth 3 (three) times daily. 10/04/18   Marrian Salvage, FNP  vitamin C (ASCORBIC ACID) 500 MG tablet Take 500 mg by mouth daily.    [provider]  zinc sulfate (ZINC-220) 220 (50 Zn) MG capsule Take 220 mg by mouth daily.    [provider]    Physical Exam: Vitals:   10/07/18 1515 10/07/18 1530 10/07/18 1600 10/07/18 1630  BP: 128/68 (!) 146/80 134/81 (!) 157/82  Pulse: 88 77 72 72   Resp: (!) 21 18 (!) 21 18  Temp:      TempSrc:      SpO2: 95% 93% 97% 96%    Constitutional:  NAD, calm, comfortable Vitals:   10/07/18 1515 10/07/18 1530 10/07/18 1600 10/07/18 1630  BP: 128/68 (!) 146/80 134/81 (!) 157/82  Pulse: 88 77 72 72  Resp: (!) 21 18 (!) 21 18  Temp:      TempSrc:      SpO2: 95% 93% 97% 96%   Eyes: PERRL, lids and conjunctivae normal ENMT: Mucous membranes are moist. Posterior pharynx clear of any exudate or lesions.Normal dentition.  Neck: normal, supple, no masses, no thyromegaly Respiratory: clear to auscultation bilaterally, no wheezing, no crackles. Normal respiratory effort. No accessory muscle use.  Cardiovascular: Regular rate and rhythm, no murmurs / rubs / gallops. No extremity edema. 2+ pedal pulses. No carotid bruits.  Abdomen: no tenderness, no masses palpated. No hepatosplenomegaly. Bowel sounds positive.  Musculoskeletal: no clubbing / cyanosis. No joint deformity upper and lower extremities. Good ROM, no contractures. Normal muscle tone.  Skin: right flank pain with several vesiclar lesions on right breast/flank, hemosiderin staining on bilateral LE Neurologic: CN 2-12 grossly intact. Sensation intact, DTR normal. Strength 5/5 in all 4.  Psychiatric: Normal judgment and insight. Alert and oriented x 3. Normal mood.    Labs on Admission: I have personally reviewed following labs and imaging studies  CBC: Recent Labs  Lab 10/07/18 1220  WBC 6.7  NEUTROABS 4.1  HGB 13.4  HCT 40.2  MCV 103.9*  PLT [id number]   Basic Metabolic Panel: Recent Labs  Lab 10/07/18 1220  NA 132*  K 2.6*  CL 90*  CO2 28  GLUCOSE 111*  BUN 18  CREATININE 1.12*  CALCIUM 8.5*   GFR: CrCl cannot be calculated (Unknown ideal weight.). Liver Function Tests: No results for input(s): AST, ALT, ALKPHOS, BILITOT, PROT, ALBUMIN in the last 168 hours. No results for input(s): LIPASE, AMYLASE in the last 168 hours. No results for input(s): AMMONIA in the last 168  hours. Coagulation Profile: No results for input(s): INR, PROTIME in the last 168 hours. Cardiac Enzymes: No results for input(s): CKTOTAL, CKMB, CKMBINDEX, TROPONINI in the last 168 hours. BNP (last 3 results) No results for input(s): PROBNP in the last 8760 hours. HbA1C: No results for input(s): HGBA1C in the last 72 hours. CBG: No results for input(s): GLUCAP in the last 168 hours. Lipid Profile: No results for input(s): CHOL, HDL, LDLCALC, TRIG, CHOLHDL, LDLDIRECT in the last 72 hours. Thyroid Function Tests: No results for input(s): TSH, T4TOTAL, FREET4, T3FREE, THYROIDAB in the last 72 hours. Anemia Panel: No results for input(s): VITAMINB12, FOLATE, FERRITIN, TIBC, IRON, RETICCTPCT in the last 72 hours. Urine analysis:    Component Value Date/Time   COLORURINE YELLOW 10/07/2018 [place] 10/07/2018 1535   LABSPEC 1.012 10/07/2018 1535   PHURINE 6.0 10/07/2018 1535   GLUCOSEU NEGATIVE 10/07/2018 1535   GLUCOSEU NEGATIVE 01/17/2015 1415   HGBUR NEGATIVE 10/07/2018 1535   [place] 10/07/2018 1535   KETONESUR NEGATIVE 10/07/2018 1535   PROTEINUR 30 (A) 10/07/2018 1535   UROBILINOGEN 0.2 01/17/2015 1415   NITRITE NEGATIVE 10/07/2018 1535   LEUKOCYTESUR NEGATIVE 10/07/2018 1535    Radiological Exams on Admission: Dg Chest Portable 1 View  Result Date: 10/07/2018 CLINICAL DATA:  Fever EXAM: PORTABLE CHEST 1 VIEW COMPARISON:  Jun 12, 2017 FINDINGS: The mediastinal contour is normal. Heart size is enlarged. There is no focal infiltrate, pulmonary edema, or pleural effusion. The bony structures are stable. IMPRESSION: Cardiomegaly.  Lungs are clear. Electronically Signed   By: Abelardo Diesel M.D.   On: 10/07/2018 12:27  EKG: Independently reviewed. A Fib/flutter  Assessment/Plan Principal Problem:   Herpes zoster Active Problems:   Hypothyroidism   Iron deficiency anemia   Essential hypertension   Atrial flutter (H[facility])   Malignant neoplasm  of upper-outer quadrant of left breast in female, estrogen receptor positive ([place])     Herpes zoster: Monitor lesions for crusting over c/w IV acyclovir ay 10mg /kg Follow cultures CBC in am  Hypothyroid: c/w home medication MAR not finalized at time of encounter No evid of hormonal dysregulation  Hypokalemia: repleated with IV and PO in ER Recheck in am  Iron def anemia Follow cbc  HTN: C/w home meds Titrate meds as indicated SBP 157/82  Atrial Flutter C/w nodal blocking agents eliquis   Hx beast cancer No acute intervention F/up with outpt provider  DVT prophylaxis: [id number]  Code Status:  Code Status History    Date Active Date Inactive Code Status Order ID Comments User Context   05/23/2017 0357 05/24/2017 1840 Full Code [id number]  Etta Quill, DO ED   07/25/2015 1439 07/26/2015 1346 Full Code [id number]  Rolm Bookbinder, MD Inpatient   05/23/2014 1757 05/26/2014 1950 Full Code [id number]  Meredith Pel, MD Inpatient   05/23/2014 1757 05/23/2014 1757 Full Code [id number]  Meredith Pel, MD Inpatient   08/26/2013 1422 09/08/2013 1910 DNR [id number]  Greggory Keen, MD Inpatient   08/25/2013 1801 08/26/2013 1422 DNR [id number]  Annita Brod, MD Inpatient   06/21/2013 2046 06/25/2013 1349 Full Code [id number]  Meredith Pel, MD Inpatient   06/21/2013 1813 06/21/2013 2046 Full Code [id number]  Theodoro Kos, DO Inpatient   06/17/2013 0225 06/21/2013 1813 Full Code [id number]  Meredith Pel, MD Inpatient   Advance Care Planning Activity    Advance Directive Documentation     Most Recent Value  Type of Advance Directive  Healthcare Power of Attorney  Pre-existing out of facility DNR order (yellow form or pink MOST form)  -  "MOST" Form in Place?  -     Family Communication: none at bedside  Disposition Plan:   Pt will be admitted inpatient for IV antivirals, close monitoring of lesions, repeat labs, f/up cultures and resolation of hyperthermia Consults called:  None Admission status: Inpatient   Nicolette Bang MD [facility]   If 7PM-7AM, please contact night-coverage   10/07/2018, 4:54 PM

## 2018-10-07 NOTE — ED Triage Notes (Signed)
Patient BIB EMS from [facility] with complaints of shingles pain, with rash beneath left breast. Patient began to feel the pain Monday 10/04/18 and was diagnosed Tuesday 10/05/18. Patient started taking antivirals on Tuesday. Patient reports since then, she has been "not feeling good." Patient AxOx4. Patient family requested patient be brought to [facility] to be checked.   EMS VS: 100.61F Temporal, 158/98, HR 84, RR 18, 94% SPO2, CBG=131.

## 2018-10-08 LAB — CBC WITH DIFFERENTIAL/PLATELET
Abs Immature Granulocytes: 0.01 10*3/uL (ref 0.00–0.07)
Basophils Absolute: 0 10*3/uL (ref 0.0–0.1)
Basophils Relative: 0 %
Eosinophils Absolute: 0 10*3/uL (ref 0.0–0.5)
Eosinophils Relative: 0 %
HCT: 40.9 % (ref 36.0–46.0)
Hemoglobin: 13.8 g/dL (ref 12.0–15.0)
Immature Granulocytes: 0 %
Lymphocytes Relative: 29 %
Lymphs Abs: 2.1 10*3/uL (ref 0.7–4.0)
MCH: 34.7 pg — ABNORMAL HIGH (ref 26.0–34.0)
MCHC: 33.7 g/dL (ref 30.0–36.0)
MCV: 102.8 fL — ABNORMAL HIGH (ref 80.0–100.0)
Monocytes Absolute: 1 10*3/uL (ref 0.1–1.0)
Monocytes Relative: 14 %
Neutro Abs: 3.9 10*3/uL (ref 1.7–7.7)
Neutrophils Relative %: 57 %
Platelets: 155 10*3/uL (ref 150–400)
RBC: 3.98 MIL/uL (ref 3.87–5.11)
RDW: 12.4 % (ref 11.5–15.5)
WBC: 7 10*3/uL (ref 4.0–10.5)
nRBC: 0 % (ref 0.0–0.2)

## 2018-10-08 LAB — BASIC METABOLIC PANEL
Anion gap: 13 (ref 5–15)
BUN: 14 mg/dL (ref 8–23)
CO2: 26 mmol/L (ref 22–32)
Calcium: 8 mg/dL — ABNORMAL LOW (ref 8.9–10.3)
Chloride: 95 mmol/L — ABNORMAL LOW (ref 98–111)
Creatinine, Ser: 0.84 mg/dL (ref 0.44–1.00)
GFR calc Af Amer: >60 mL/min (ref >60)
GFR calc non Af Amer: >60 mL/min (ref >60)
Glucose, Bld: 73 mg/dL (ref 70–99)
Potassium: 2.5 mmol/L — CL (ref 3.5–5.1)
Sodium: 134 mmol/L — ABNORMAL LOW (ref 135–145)

## 2018-10-08 LAB — GLUCOSE, CAPILLARY
Glucose-Capillary: 115 mg/dL — ABNORMAL HIGH (ref 70–99)
Glucose-Capillary: 145 mg/dL — ABNORMAL HIGH (ref 70–99)

## 2018-10-08 LAB — CBG MONITORING, ED
Glucose-Capillary: 80 mg/dL (ref 70–99)
Glucose-Capillary: 111 mg/dL — ABNORMAL HIGH (ref 70–99)

## 2018-10-08 LAB — HEMOGLOBIN A1C
Hgb A1c MFr Bld: 5.9 % — ABNORMAL HIGH (ref 4.8–5.6)
Mean Plasma Glucose: 123 mg/dL

## 2018-10-08 LAB — MRSA PCR SCREENING: MRSA by PCR: NEGATIVE

## 2018-10-08 MED ORDER — POTASSIUM CHLORIDE CRYS ER 20 MEQ PO TBCR
40.0000 meq | EXTENDED_RELEASE_TABLET | Freq: Once | ORAL | Status: AC
  Administered 2018-10-08: 40 meq via ORAL
  Filled 2018-10-08: qty 2

## 2018-10-08 MED ORDER — POTASSIUM CHLORIDE 10 MEQ/100ML IV SOLN
10.0000 meq | INTRAVENOUS | Status: AC
  Administered 2018-10-08 (×3): 10 meq via INTRAVENOUS
  Filled 2018-10-08 (×3): qty 100

## 2018-10-08 NOTE — ED Notes (Addendum)
Date and time results received: 10/08/18 0645 (use smartphrase ".now" to insert current time)  Test: Potassium Critical Value: 2.5  Name of Provider Notified: [facility]  Orders Received? Or Actions Taken?: Orders Received - See Orders for details

## 2018-10-08 NOTE — ED Notes (Signed)
Pt was in room calling for help. Writer of this note entered the room, pt stated, "Im tight as a tick, I need to go home to get some rest. I have been here for days with no rest." Writer of this note reoriented pt, explaining it is 0345am, she is a the hospital, she is safe, and we are keeping her here for her shingles and dehydration. Pt was then educated on the use of the call light if she needed assistance. Blinds of door were opened so pt could see out into the hallway. Will continue to monitor.

## 2018-10-08 NOTE — ED Notes (Signed)
Patient incontinent of stool and urine-cleaned and bed linens changed with new purewick applied-bottom with slight redness and barrier cream applied

## 2018-10-08 NOTE — ED Notes (Signed)
Call received from pt granddaughter Almetta Lovely [phone] requesting update on pt status when possible. RN advised. [employer]

## 2018-10-08 NOTE — Progress Notes (Signed)
PROGRESS NOTE    [name]  [id number] DOB: 1925/09/29 DOA: 10/07/2018 PCP: [name], MD   Brief Narrative:  [name] LEVITAN is a 85 y.o. female with medical history significant for HTN, HLD, DM, Left breast cancer who presents by EMS from [facility] independent living secondary to right breast rash consistent with shingles. Pt is a poor historian but reported a several day hx of "stinging" sensation 8/10 in her right breast and was seen by the provider there. She indicated she had a "rash" and had been feeling fatigued overall for several days.  She was diagnosed with shingles and sent to the ER for further eval.  ED Course: Tm 102.1, wbc 6.7, k 2.6 bun/cr 18/1.12, cxr clear for acute process but positive cardiomegaly, lactic acid 1.6 > 1.2, discussed case with pharmacy who recommended IV acyclovir 10mg /kg, skin findings c/w zoster   Assessment & Plan:   Principal Problem:   Herpes zoster Active Problems:   Hypothyroidism   Iron deficiency anemia   Essential hypertension   Atrial flutter (HCC)   Malignant neoplasm of upper-outer quadrant of left breast in female, estrogen receptor positive ([place])   Herpes zoster: Monitor lesions for crusting over, some are but other are fluid filled c/w IV acyclovir ay 10mg /kg, pharm following Blood cultures neg CBC in am  Hypothyroid: c/w home medication Review MAR No evid of hormonal dysregulation  Hypokalemia: repleated with IV and PO in ER 2.5 today, again repleted, recheck in am  Iron def anemia Follow cbc  HTN: C/w home meds Titrate meds as indicated SBP 148/97  Atrial Flutter C/w nodal blocking agents eliquis   Hx beast cancer No acute intervention F/up with outpt provider  DVT prophylaxis: [id number]  Code Status: Full    Code Status Orders  (From admission, onward)         Start     Ordered   10/07/18 1802  Full code  Continuous     10/07/18 1802        Code Status History    Date Active Date Inactive Code Status Order ID Comments User Context   05/23/2017 0357 05/24/2017 1840 Full Code [id number]  Etta Quill, DO ED   07/25/2015 1439 07/26/2015 1346 Full Code [id number]  Rolm Bookbinder, MD Inpatient   05/23/2014 1757 05/26/2014 1950 Full Code [id number]  Meredith Pel, MD Inpatient   05/23/2014 1757 05/23/2014 1757 Full Code [id number]  Meredith Pel, MD Inpatient   08/26/2013 1422 09/08/2013 1910 DNR [id number]  Greggory Keen, MD Inpatient   08/25/2013 1801 08/26/2013 1422 DNR [id number]  Annita Brod, MD Inpatient   06/21/2013 2046 06/25/2013 1349 Full Code [id number]  Meredith Pel, MD Inpatient   06/21/2013 1813 06/21/2013 2046 Full Code [id number]  Theodoro Kos, DO Inpatient   06/17/2013 0225 06/21/2013 1813 Full Code [id number]  Meredith Pel, MD Inpatient   Advance Care Planning Activity    Advance Directive Documentation     Most Recent Value  Type of Advance Directive  Healthcare Power of Attorney  Pre-existing out of facility DNR order (yellow form or pink MOST form)  -  "MOST" Form in Place?  -     Family Communication: none today  Disposition Plan:   Patient will remain inpatient for treatment of herpes zoster with fluid-filled vesicles, she is on IV acyclovir per infectious disease recommendation. Consults called: None Admission status: Inpatient   Consultants:   None  Procedures:  Dg Chest Portable 1 View  Result Date: 10/07/2018 CLINICAL DATA:  Fever EXAM: PORTABLE CHEST 1 VIEW COMPARISON:  Jun 12, 2017 FINDINGS: The mediastinal contour is normal. Heart size is enlarged. There is no focal infiltrate, pulmonary edema, or pleural effusion. The bony structures are stable. IMPRESSION: Cardiomegaly.  Lungs are clear. Electronically Signed   By: Abelardo Diesel M.D.   On: 10/07/2018 12:27     Antimicrobials:   Acyclovir >9/17, day 2    Subjective: No acute events overnight Remains in the ER awaiting a bed Felt that some of the vesicles  were scabbing over  Objective: Vitals:   10/08/18 1200 10/08/18 1230 10/08/18 1300 10/08/18 1332  BP: 129/71 (!) 142/91 140/79 (!) 148/97  Pulse: 86 79 83 73  Resp: (!) 22 20 19 16   Temp:      TempSrc:      SpO2: 97% 92% 96% 96%  Weight:        Intake/Output Summary (Last 24 hours) at 10/08/2018 1424 Last data filed at 10/08/2018 0555 Gross per 24 hour  Intake 150 ml  Output 2275 ml  Net -2125 ml   Filed Weights   10/07/18 1700  Weight: 78 kg    Examination:  General exam: Appears calm and comfortable  Respiratory system: Clear to auscultation. Respiratory effort normal. Cardiovascular system: S1 & S2 heard, RRR. No JVD, murmurs, rubs, gallops or clicks. No pedal edema. Gastrointestinal system: Abdomen is nondistended, soft and nontender. No organomegaly or masses felt. Normal bowel sounds heard. Central nervous system: Alert and oriented. No focal neurological deficits. Extremities: WWP, . Skin: Fluid-filled vesicular rash,  Psychiatry: Judgement and insight appear normal. Mood & affect appropriate.     Data Reviewed: I have personally reviewed following labs and imaging studies  CBC: Recent Labs  Lab 10/07/18 1220 10/08/18 0557  WBC 6.7 7.0  NEUTROABS 4.1 3.9  HGB 13.4 13.8  HCT 40.2 40.9  MCV 103.9* 102.8*  PLT 169 [id number]   Basic Metabolic Panel: Recent Labs  Lab 10/07/18 1220 10/08/18 0557  NA 132* 134*  K 2.6* 2.5*  CL 90* 95*  CO2 28 26  GLUCOSE 111* 73  BUN 18 14  CREATININE 1.12* 0.84  CALCIUM 8.5* 8.0*   GFR: Estimated Creatinine Clearance: 46 mL/min (by C-G formula based on SCr of 0.84 mg/dL). Liver Function Tests: No results for input(s): AST, ALT, ALKPHOS, BILITOT, PROT, ALBUMIN in the last 168 hours. No results for input(s): LIPASE, AMYLASE in the last 168 hours. No results for input(s): AMMONIA in the last 168 hours. Coagulation Profile: No results for input(s): INR, PROTIME in the last 168 hours. Cardiac Enzymes: No results for  input(s): CKTOTAL, CKMB, CKMBINDEX, TROPONINI in the last 168 hours. BNP (last 3 results) No results for input(s): PROBNP in the last 8760 hours. HbA1C: No results for input(s): HGBA1C in the last 72 hours. CBG: Recent Labs  Lab 10/07/18 1809 10/07/18 2235 10/08/18 0833 10/08/18 1334  GLUCAP 131* 101* 80 111*   Lipid Profile: No results for input(s): CHOL, HDL, LDLCALC, TRIG, CHOLHDL, LDLDIRECT in the last 72 hours. Thyroid Function Tests: No results for input(s): TSH, T4TOTAL, FREET4, T3FREE, THYROIDAB in the last 72 hours. Anemia Panel: No results for input(s): VITAMINB12, FOLATE, FERRITIN, TIBC, IRON, RETICCTPCT in the last 72 hours. Sepsis Labs: Recent Labs  Lab 10/07/18 1219 10/07/18 1524  LATICACIDVEN 1.6 1.2    Recent Results (from the past 240 hour(s))  SARS Coronavirus 2 [facility] order, Performed in [facility] hospital lab) Nasopharyngeal Nasopharyngeal Swab  Status: None   Collection Time: 10/07/18 11:57 AM   Specimen: Nasopharyngeal Swab  Result Value Ref Range Status   SARS Coronavirus 2 NEGATIVE NEGATIVE Final    Comment: (NOTE) If result is NEGATIVE SARS-CoV-2 target nucleic acids are NOT DETECTED. The SARS-CoV-2 RNA is generally detectable in upper and lower  respiratory specimens during the acute phase of infection. The lowest  concentration of SARS-CoV-2 viral copies this assay can detect is 250  copies / mL. A negative result does not preclude SARS-CoV-2 infection  and should not be used as the sole basis for treatment or other  patient management decisions.  A negative result may occur with  improper specimen collection / handling, submission of specimen other  than nasopharyngeal swab, presence of viral mutation(s) within the  areas targeted by this assay, and inadequate number of viral copies  (<250 copies / mL). A negative result must be combined with clinical  observations, patient history, and epidemiological information. If result is  POSITIVE SARS-CoV-2 target nucleic acids are DETECTED. The SARS-CoV-2 RNA is generally detectable in upper and lower  respiratory specimens dur ing the acute phase of infection.  Positive  results are indicative of active infection with SARS-CoV-2.  Clinical  correlation with patient history and other diagnostic information is  necessary to determine patient infection status.  Positive results do  not rule out bacterial infection or co-infection with other viruses. If result is PRESUMPTIVE POSTIVE SARS-CoV-2 nucleic acids MAY BE PRESENT.   A presumptive positive result was obtained on the submitted specimen  and confirmed on repeat testing.  While 2019 novel coronavirus  (SARS-CoV-2) nucleic acids may be present in the submitted sample  additional confirmatory testing may be necessary for epidemiological  and / or clinical management purposes  to differentiate between  SARS-CoV-2 and other Sarbecovirus currently known to infect humans.  If clinically indicated additional testing with an alternate test  methodology [phone]) is advised. The SARS-CoV-2 RNA is generally  detectable in upper and lower respiratory sp ecimens during the acute  phase of infection. The expected result is Negative. Fact Sheet for Patients:  StrictlyIdeas.no Fact Sheet for Healthcare Providers: BankingDealers.co.za This test is not yet approved or cleared by the [place] FDA and has been authorized for detection and/or diagnosis of SARS-CoV-2 by FDA under an Emergency Use Authorization (EUA).  This EUA will remain in effect (meaning this test can be used) for the duration of the COVID-19 declaration under Section 564(b)(1) of the Act, 21 U.S.C. section 360bbb-3(b)(1), unless the authorization is terminated or revoked sooner. Performed at [facility], [place] [address] [place], [place] [address]   Culture, blood (routine x 2)     Status:  None (Preliminary result)   Collection Time: 10/07/18 11:57 AM   Specimen: BLOOD  Result Value Ref Range Status   Specimen Description   Final    BLOOD LEFT HAND Performed at [facility] [address] [place], [place] [address]    Special Requests   Final    BOTTLES DRAWN AEROBIC AND ANAEROBIC Blood Culture results may not be optimal due to an inadequate volume of blood received in culture bottles Performed at [facility] [address], [place], [place] [address]    Culture   Final    NO GROWTH < 24 HOURS Performed at [facility] [address] [place], [place] [address]    Report Status PENDING  Incomplete  Culture, blood (routine x 2)  Status: None (Preliminary result)   Collection Time: 10/07/18 11:57 AM   Specimen: BLOOD  Result Value Ref Range Status   Specimen Description   Final    BLOOD BLOOD LEFT FOREARM Performed at [facility] [address] [place], [place] [address]    Special Requests   Final    BOTTLES DRAWN AEROBIC AND ANAEROBIC Blood Culture results may not be optimal due to an inadequate volume of blood received in culture bottles Performed at [facility] [address], [place], [place] [address]    Culture   Final    NO GROWTH < 24 HOURS Performed at [facility] [address] [place],  [address]    Report Status PENDING  Incomplete         Radiology Studies: Dg Chest Portable 1 View  Result Date: 10/07/2018 CLINICAL DATA:  Fever EXAM: PORTABLE CHEST 1 VIEW COMPARISON:  Jun 12, 2017 FINDINGS: The mediastinal contour is normal. Heart size is enlarged. There is no focal infiltrate, pulmonary edema, or pleural effusion. The bony structures are stable. IMPRESSION: Cardiomegaly.  Lungs are clear. Electronically Signed   By: Abelardo Diesel M.D.   On: 10/07/2018 12:27        Scheduled Meds: . anastrozole  1 mg Oral Daily  . apixaban  5 mg  Oral BID  . atorvastatin  10 mg Oral QPM  . B-complex with vitamin C  1 tablet Oral Daily  . busPIRone  7.5 mg Oral TID  . cholecalciferol  1,000 Units Oral Daily  . diltiazem  120 mg Oral Daily  . docusate sodium  100 mg Oral BID  . insulin aspart  0-15 Units Subcutaneous TID WC  . insulin aspart  0-5 Units Subcutaneous QHS  . levothyroxine  25 mcg Oral QAC breakfast  . linaclotide  290 mcg Oral Daily  . metoprolol succinate  50 mg Oral BID  . multivitamin with minerals  1 tablet Oral Daily  . oxybutynin  5 mg Oral BID  . pantoprazole  40 mg Oral Daily  . potassium chloride SA  20 mEq Oral Daily  . predniSONE  20 mg Oral Q breakfast  . temazepam  30 mg Oral QHS  . vitamin C  500 mg Oral Daily   Continuous Infusions: . sodium chloride 75 mL/hr at 10/07/18 1844  . acyclovir 780 mg (10/08/18 0629)     LOS: 1 day    Time spent: 1 min    Nicolette Bang, MD [facility]  If 7PM-7AM, please contact night-coverage  10/08/2018, 2:24 PM

## 2018-10-08 NOTE — ED Notes (Signed)
Pt given lunch tray at this time

## 2018-10-09 LAB — CBC WITH DIFFERENTIAL/PLATELET
Abs Immature Granulocytes: 0.01 10*3/uL (ref 0.00–0.07)
Basophils Absolute: 0 10*3/uL (ref 0.0–0.1)
Basophils Relative: 1 %
Eosinophils Absolute: 0.1 10*3/uL (ref 0.0–0.5)
Eosinophils Relative: 2 %
HCT: 43.2 % (ref 36.0–46.0)
Hemoglobin: 14.7 g/dL (ref 12.0–15.0)
Immature Granulocytes: 0 %
Lymphocytes Relative: 32 %
Lymphs Abs: 1.9 10*3/uL (ref 0.7–4.0)
MCH: 34.6 pg — ABNORMAL HIGH (ref 26.0–34.0)
MCHC: 34 g/dL (ref 30.0–36.0)
MCV: 101.6 fL — ABNORMAL HIGH (ref 80.0–100.0)
Monocytes Absolute: 0.8 10*3/uL (ref 0.1–1.0)
Monocytes Relative: 13 %
Neutro Abs: 3 10*3/uL (ref 1.7–7.7)
Neutrophils Relative %: 52 %
Platelets: 159 10*3/uL (ref 150–400)
RBC: 4.25 MIL/uL (ref 3.87–5.11)
RDW: 12.3 % (ref 11.5–15.5)
WBC: 5.9 10*3/uL (ref 4.0–10.5)
nRBC: 0 % (ref 0.0–0.2)

## 2018-10-09 LAB — BASIC METABOLIC PANEL
Anion gap: 13 (ref 5–15)
BUN: 11 mg/dL (ref 8–23)
CO2: 23 mmol/L (ref 22–32)
Calcium: 7.7 mg/dL — ABNORMAL LOW (ref 8.9–10.3)
Chloride: 99 mmol/L (ref 98–111)
Creatinine, Ser: 0.62 mg/dL (ref 0.44–1.00)
GFR calc Af Amer: >60 mL/min (ref >60)
GFR calc non Af Amer: >60 mL/min (ref >60)
Glucose, Bld: 82 mg/dL (ref 70–99)
Potassium: 3 mmol/L — ABNORMAL LOW (ref 3.5–5.1)
Sodium: 135 mmol/L (ref 135–145)

## 2018-10-09 LAB — GLUCOSE, CAPILLARY
Glucose-Capillary: 77 mg/dL (ref 70–99)
Glucose-Capillary: 70 mg/dL (ref 70–99)
Glucose-Capillary: 170 mg/dL — ABNORMAL HIGH (ref 70–99)
Glucose-Capillary: 151 mg/dL — ABNORMAL HIGH (ref 70–99)

## 2018-10-09 MED ORDER — POTASSIUM CHLORIDE CRYS ER 20 MEQ PO TBCR
40.0000 meq | EXTENDED_RELEASE_TABLET | ORAL | Status: AC
  Administered 2018-10-09 (×2): 40 meq via ORAL
  Filled 2018-10-09 (×2): qty 2

## 2018-10-09 MED ORDER — DEXTROSE 5 % IV SOLN
10.0000 mg/kg | Freq: Three times a day (TID) | INTRAVENOUS | Status: AC
  Administered 2018-10-09 – 2018-10-11 (×6): 780 mg via INTRAVENOUS
  Filled 2018-10-09 (×6): qty 15.6

## 2018-10-09 NOTE — Progress Notes (Signed)
PROGRESS NOTE    [name]  [id number] DOB: Apr 28, 1925 DOA: 10/07/2018 PCP: [name], MD   Brief Narrative:  [name] a 85 y.o.femalewith medical history significant forHTN, HLD, DM, Left breast cancer who presents by EMS from [facility] independent living secondary to right breast rash consistent with shingles. Pt is a poor historian but reported a several day hx of "stinging" sensation 8/10 in her right breast and was seen by the provider there. She indicated she had a "rash" and had been feeling fatigued overall for several days. She was diagnosed with shingles and sent to the ER for further eval.   Assessment & Plan:   Principal Problem:   Herpes zoster Active Problems:   Hypothyroidism   Iron deficiency anemia   Essential hypertension   Atrial flutter (HCC)   Malignant neoplasm of upper-outer quadrant of left breast in female, estrogen receptor positive ([place])   Herpes zoster: Continue to monitor lesions for crusting over, some are but other are fluid filled c/w IV acyclovir ay 10mg /kg, pharm following Blood cultures neg CBC in am  Hypothyroid: c/w home medication Review MAR No evid of hormonal dysregulation  Hypokalemia: repleated with IV and PO in ER 3 today, again repleted, recheck in am  Iron def anemia Follow cbc  HTN: C/w home meds Titrate meds as indicated SBP 115/74  Atrial Flutter C/w nodal blocking agents eliquis   Hx beast cancer No acute intervention F/up with outpt provider  DVT prophylaxis: [id number]  Code Status: Full    Code Status Orders  (From admission, onward)         Start     Ordered   10/07/18 1802  Full code  Continuous     10/07/18 1802        Code Status History    Date Active Date Inactive Code Status Order ID Comments User Context   05/23/2017 0357 05/24/2017 1840 Full Code [id number]  Etta Quill, DO ED   07/25/2015 1439 07/26/2015 1346 Full Code [id number]  Rolm Bookbinder,  MD Inpatient   05/23/2014 1757 05/26/2014 1950 Full Code [id number]  Meredith Pel, MD Inpatient   05/23/2014 1757 05/23/2014 1757 Full Code [id number]  Meredith Pel, MD Inpatient   08/26/2013 1422 09/08/2013 1910 DNR [id number]  Greggory Keen, MD Inpatient   08/25/2013 1801 08/26/2013 1422 DNR [id number]  Annita Brod, MD Inpatient   06/21/2013 2046 06/25/2013 1349 Full Code [id number]  Meredith Pel, MD Inpatient   06/21/2013 1813 06/21/2013 2046 Full Code [id number]  Theodoro Kos, DO Inpatient   06/17/2013 0225 06/21/2013 1813 Full Code [id number]  Meredith Pel, MD Inpatient   Advance Care Planning Activity    Advance Directive Documentation     Most Recent Value  Type of Advance Directive  Healthcare Power of Attorney  Pre-existing out of facility DNR order (yellow form or pink MOST form)  -  "MOST" Form in Place?  -     Family Communication: Discussed with granddaughter Disposition Plan:   Patient remained inpatient receiving IV acyclovir, Consults called: None Admission status: Inpatient   Consultants:   None  Procedures:  Dg Chest Portable 1 View  Result Date: 10/07/2018 CLINICAL DATA:  Fever EXAM: PORTABLE CHEST 1 VIEW COMPARISON:  Jun 12, 2017 FINDINGS: The mediastinal contour is normal. Heart size is enlarged. There is no focal infiltrate, pulmonary edema, or pleural effusion. The bony structures are stable. IMPRESSION: Cardiomegaly.  Lungs are clear. Electronically Signed   By: Seward Meth  Augustin Coupe M.D.   On: 10/07/2018 12:27     Antimicrobials:   IV acyclovir   Subjective: Patient doing well this morning mental status improved more awake more alert more energy  Objective: Vitals:   10/08/18 1600 10/08/18 2041 10/09/18 0708 10/09/18 1348  BP:  (!) 153/98 (!) 117/57 115/74  Pulse:  85 65 69  Resp:  18 16 17   Temp:  97.8 F (36.6 C) 97.8 F (36.6 C) 97.6 F (36.4 C)  TempSrc:  Oral Oral Oral  SpO2:  97% 96% 98%  Weight: 74.3 kg     Height: 5' 5.5" (1.664 m)        Intake/Output Summary (Last 24 hours) at 10/09/2018 1505 Last data filed at 10/09/2018 0727 Gross per 24 hour  Intake 1381.41 ml  Output 2400 ml  Net -1018.59 ml   Filed Weights   10/07/18 1700 10/08/18 1600  Weight: 78 kg 74.3 kg    Examination:  General exam: Appears calm and comfortable, more awake and alert today Respiratory system: Clear to auscultation. Respiratory effort normal. Cardiovascular system: S1 & S2 heard, RRR. No JVD, murmurs, rubs, gallops or clicks. No pedal edema. Gastrointestinal system: Abdomen is nondistended, soft and nontender. No organomegaly or masses felt. Normal bowel sounds heard. Central nervous system: Alert and oriented. No focal neurological deficits. Extremities: Normal perfused no edema Skin: Vesicular rashes in various stages.  Some drying some still fluid-filled Psychiatry: Judgement and insight appear normal. Mood & affect appropriate.     Data Reviewed: I have personally reviewed following labs and imaging studies  CBC: Recent Labs  Lab 10/07/18 1220 10/08/18 0557 10/09/18 0922  WBC 6.7 7.0 5.9  NEUTROABS 4.1 3.9 3.0  HGB 13.4 13.8 14.7  HCT 40.2 40.9 43.2  MCV 103.9* 102.8* 101.6*  PLT 169 155 [id number]   Basic Metabolic Panel: Recent Labs  Lab 10/07/18 1220 10/08/18 0557 10/09/18 0922  NA 132* 134* 135  K 2.6* 2.5* 3.0*  CL 90* 95* 99  CO2 28 26 23   GLUCOSE 111* 73 82  BUN 18 14 11   CREATININE 1.12* 0.84 0.62  CALCIUM 8.5* 8.0* 7.7*   GFR: Estimated Creatinine Clearance: 47.7 mL/min (by C-G formula based on SCr of 0.62 mg/dL). Liver Function Tests: No results for input(s): AST, ALT, ALKPHOS, BILITOT, PROT, ALBUMIN in the last 168 hours. No results for input(s): LIPASE, AMYLASE in the last 168 hours. No results for input(s): AMMONIA in the last 168 hours. Coagulation Profile: No results for input(s): INR, PROTIME in the last 168 hours. Cardiac Enzymes: No results for input(s): CKTOTAL, CKMB, CKMBINDEX, TROPONINI  in the last 168 hours. BNP (last 3 results) No results for input(s): PROBNP in the last 8760 hours. HbA1C: Recent Labs    10/07/18 1230  HGBA1C 5.9*   CBG: Recent Labs  Lab 10/08/18 1334 10/08/18 1622 10/08/18 2123 10/09/18 0920 10/09/18 1205  GLUCAP 111* 115* 145* 77 70   Lipid Profile: No results for input(s): CHOL, HDL, LDLCALC, TRIG, CHOLHDL, LDLDIRECT in the last 72 hours. Thyroid Function Tests: No results for input(s): TSH, T4TOTAL, FREET4, T3FREE, THYROIDAB in the last 72 hours. Anemia Panel: No results for input(s): VITAMINB12, FOLATE, FERRITIN, TIBC, IRON, RETICCTPCT in the last 72 hours. Sepsis Labs: Recent Labs  Lab 10/07/18 1219 10/07/18 1524  LATICACIDVEN 1.6 1.2    Recent Results (from the past 240 hour(s))  SARS Coronavirus 2 [facility] order, Performed in [facility] hospital lab) Nasopharyngeal Nasopharyngeal Swab     Status: None  Collection Time: 10/07/18 11:57 AM   Specimen: Nasopharyngeal Swab  Result Value Ref Range Status   SARS Coronavirus 2 NEGATIVE NEGATIVE Final    Comment: (NOTE) If result is NEGATIVE SARS-CoV-2 target nucleic acids are NOT DETECTED. The SARS-CoV-2 RNA is generally detectable in upper and lower  respiratory specimens during the acute phase of infection. The lowest  concentration of SARS-CoV-2 viral copies this assay can detect is 250  copies / mL. A negative result does not preclude SARS-CoV-2 infection  and should not be used as the sole basis for treatment or other  patient management decisions.  A negative result may occur with  improper specimen collection / handling, submission of specimen other  than nasopharyngeal swab, presence of viral mutation(s) within the  areas targeted by this assay, and inadequate number of viral copies  (<250 copies / mL). A negative result must be combined with clinical  observations, patient history, and epidemiological information. If result is POSITIVE SARS-CoV-2 target nucleic  acids are DETECTED. The SARS-CoV-2 RNA is generally detectable in upper and lower  respiratory specimens dur ing the acute phase of infection.  Positive  results are indicative of active infection with SARS-CoV-2.  Clinical  correlation with patient history and other diagnostic information is  necessary to determine patient infection status.  Positive results do  not rule out bacterial infection or co-infection with other viruses. If result is PRESUMPTIVE POSTIVE SARS-CoV-2 nucleic acids MAY BE PRESENT.   A presumptive positive result was obtained on the submitted specimen  and confirmed on repeat testing.  While 2019 novel coronavirus  (SARS-CoV-2) nucleic acids may be present in the submitted sample  additional confirmatory testing may be necessary for epidemiological  and / or clinical management purposes  to differentiate between  SARS-CoV-2 and other Sarbecovirus currently known to infect humans.  If clinically indicated additional testing with an alternate test  methodology [phone]) is advised. The SARS-CoV-2 RNA is generally  detectable in upper and lower respiratory sp ecimens during the acute  phase of infection. The expected result is Negative. Fact Sheet for Patients:  StrictlyIdeas.no Fact Sheet for Healthcare Providers: BankingDealers.co.za This test is not yet approved or cleared by the [place] FDA and has been authorized for detection and/or diagnosis of SARS-CoV-2 by FDA under an Emergency Use Authorization (EUA).  This EUA will remain in effect (meaning this test can be used) for the duration of the COVID-19 declaration under Section 564(b)(1) of the Act, 21 U.S.C. section 360bbb-3(b)(1), unless the authorization is terminated or revoked sooner. Performed at [facility], [place] [address], [place], [place] [address]   Culture, blood (routine x 2)     Status: None (Preliminary result)    Collection Time: 10/07/18 11:57 AM   Specimen: BLOOD  Result Value Ref Range Status   Specimen Description   Final    BLOOD LEFT HAND Performed at [facility] [address] [place], [place] [address]    Special Requests   Final    BOTTLES DRAWN AEROBIC AND ANAEROBIC Blood Culture results may not be optimal due to an inadequate volume of blood received in culture bottles Performed at [facility] [address] [place], [place] [address]    Culture   Final    NO GROWTH < 24 HOURS Performed at [facility] [address] [place], [place] [address]    Report Status PENDING  Incomplete  Culture, blood (routine x 2)  Status: None (Preliminary result)   Collection Time: 10/07/18 11:57 AM   Specimen: BLOOD  Result Value Ref Range Status   Specimen Description   Final    BLOOD BLOOD LEFT FOREARM Performed at [facility] [name] [place], [place] [address]    Special Requests   Final    BOTTLES DRAWN AEROBIC AND ANAEROBIC Blood Culture results may not be optimal due to an inadequate volume of blood received in culture bottles Performed at [facility] [address] [place], [place] [address]    Culture   Final    NO GROWTH < 24 HOURS Performed at [facility] [address], [place], [place] [address]    Report Status PENDING  Incomplete  MRSA PCR Screening     Status: None   Collection Time: 10/08/18  6:12 PM   Specimen: Nasal Mucosa; Nasopharyngeal  Result Value Ref Range Status   MRSA by PCR NEGATIVE NEGATIVE Final    Comment:        The GeneXpert MRSA Assay (FDA approved for NASAL specimens only), is one component of a comprehensive MRSA colonization surveillance program. It is not intended to diagnose MRSA infection nor to guide or monitor treatment for MRSA infections. Performed at [facility], [place] [address] [place], [place] [address]           Radiology Studies: No results found.      Scheduled Meds: . anastrozole  1 mg Oral Daily  . apixaban  5 mg Oral BID  . atorvastatin  10 mg Oral QPM  . B-complex with vitamin C  1 tablet Oral Daily  . busPIRone  7.5 mg Oral TID  . cholecalciferol  1,000 Units Oral Daily  . diltiazem  120 mg Oral Daily  . docusate sodium  100 mg Oral BID  . insulin aspart  0-15 Units Subcutaneous TID WC  . insulin aspart  0-5 Units Subcutaneous QHS  . levothyroxine  25 mcg Oral QAC breakfast  . linaclotide  290 mcg Oral Daily  . metoprolol succinate  50 mg Oral BID  . multivitamin with minerals  1 tablet Oral Daily  . oxybutynin  5 mg Oral BID  . pantoprazole  40 mg Oral Daily  . potassium chloride SA  20 mEq Oral Daily  . potassium chloride  40 mEq Oral Q4H  . predniSONE  20 mg Oral Q breakfast  . temazepam  30 mg Oral QHS  . vitamin C  500 mg Oral Daily   Continuous Infusions: . sodium chloride 75 mL/hr at 10/09/18 1308  . acyclovir       LOS: 2 days    Time spent: 35 min    Nicolette Bang, MD [facility]  If 7PM-7AM, please contact night-coverage  10/09/2018, 3:05 PM

## 2018-10-09 NOTE — Progress Notes (Signed)
Pharmacy Antibiotic Note  [name] is a 85 y.o. female admitted on 10/07/2018 with herpes zoster.  Pharmacy has been consulted for acyclovir dosing.  Today, 10/09/18  SCr 0.62 improving  WBC WNL stable  Afebrile  Plan: Due to improved renal function, change acyclovir from 10mg /kg IV q12 to 10mg /kg IV q8  Height: 5' 5.5" (166.4 cm) Weight: 163 lb 12.8 oz (74.3 kg) IBW/kg (Calculated) : 58.15  Temp (24hrs), Avg:98.3 F (36.8 C), Min:97.6 F (36.4 C), Max:99.4 F (37.4 C)  Recent Labs  Lab 10/07/18 1219 10/07/18 1220 10/07/18 1524 10/08/18 0557 10/09/18 0922  WBC  --  6.7  --  7.0 5.9  CREATININE  --  1.12*  --  0.84 0.62  LATICACIDVEN 1.6  --  1.2  --   --     Estimated Creatinine Clearance: 47.7 mL/min (by C-G formula based on SCr of 0.62 mg/dL).    Allergies  Allergen Reactions  . Zosyn [Piperacillin Sod-Tazobactam So] Hives, Rash and Other (See Comments)    Morbilliform eruption with itching  . Atenolol Nausea And Vomiting  . Clarithromycin Nausea And Vomiting  . Codeine Sulfate Nausea Only  . Levaquin [Levofloxacin] Nausea Only  . Macrodantin Other (See Comments)    UNSPECIFIED   . Oxycodone-Acetaminophen Nausea And Vomiting  . Percocet [Oxycodone-Acetaminophen] Nausea And Vomiting     Thank you for allowing pharmacy to be a part of this patient's care.  Kara Mead 10/09/2018 2:31 PM

## 2018-10-09 NOTE — Progress Notes (Addendum)
Patient has been, for the most part other than forgetful, alert and oriented x3. As day has progressed she has been becoming increasingly more confused. VSS. Reoriented and continue to monitor. Eulas Post, RN

## 2018-10-10 LAB — BASIC METABOLIC PANEL
Anion gap: 12 (ref 5–15)
BUN: 11 mg/dL (ref 8–23)
CO2: 20 mmol/L — ABNORMAL LOW (ref 22–32)
Calcium: 8.3 mg/dL — ABNORMAL LOW (ref 8.9–10.3)
Chloride: 107 mmol/L (ref 98–111)
Creatinine, Ser: 0.64 mg/dL (ref 0.44–1.00)
GFR calc Af Amer: >60 mL/min (ref >60)
GFR calc non Af Amer: >60 mL/min (ref >60)
Glucose, Bld: 84 mg/dL (ref 70–99)
Potassium: 3.8 mmol/L (ref 3.5–5.1)
Sodium: 139 mmol/L (ref 135–145)

## 2018-10-10 LAB — GLUCOSE, CAPILLARY
Glucose-Capillary: 92 mg/dL (ref 70–99)
Glucose-Capillary: 104 mg/dL — ABNORMAL HIGH (ref 70–99)
Glucose-Capillary: 117 mg/dL — ABNORMAL HIGH (ref 70–99)
Glucose-Capillary: 136 mg/dL — ABNORMAL HIGH (ref 70–99)

## 2018-10-10 MED ORDER — HALOPERIDOL LACTATE 5 MG/ML IJ SOLN
2.0000 mg | Freq: Once | INTRAMUSCULAR | Status: AC
  Administered 2018-10-10: 2 mg via INTRAVENOUS
  Filled 2018-10-10: qty 1

## 2018-10-10 NOTE — Progress Notes (Signed)
PROGRESS NOTE    [name]  [id number] DOB: August 03, 1925 DOA: 10/07/2018 PCP: [name], MD   Brief Narrative:  [name] a 85 y.o.femalewith medical history significant forHTN, HLD, DM, Left breast cancer who presents by EMS from [facility] independent living secondary to right breast rash consistent with shingles. Pt is a poor historian but reported a several day hx of "stinging" sensation 8/10 in her right breast and was seen by the provider there. She indicated she had a "rash" and had been feeling fatigued overall for several days. She was diagnosed with shingles and sent to the ER for further eval.   Assessment & Plan:   Principal Problem:   Herpes zoster Active Problems:   Hypothyroidism   Iron deficiency anemia   Essential hypertension   Atrial flutter (HCC)   Malignant neoplasm of upper-outer quadrant of left breast in female, estrogen receptor positive ([place])   Herpes zoster: Continue to monitor lesions for crusting over, some are but other are fluid filled Continued improvement from prior exam c/w IV acyclovir ay 10mg /kg, pharm following Bloodculturesneg  Hypothyroid: c/w home medication No evid of hormonal dysregulation  Hypokalemia: repleated with IV and PO in ER 3.8 today, recheck in am  Iron def anemia Follow cbc  HTN: C/w home meds Titrate meds as indicated SBP 131/80  Atrial Flutter C/w nodal blocking agents eliquis   Hx beast cancer No acute intervention F/up with outpt provider  DVT prophylaxis: [id number]  Code Status: Full code    Code Status Orders  (From admission, onward)         Start     Ordered   10/07/18 1802  Full code  Continuous     10/07/18 1802        Code Status History    Date Active Date Inactive Code Status Order ID Comments User Context   05/23/2017 0357 05/24/2017 1840 Full Code [id number]  Etta Quill, DO ED   07/25/2015 1439 07/26/2015 1346 Full Code [id number]  Rolm Bookbinder, MD Inpatient   05/23/2014 1757 05/26/2014 1950 Full Code [id number]  Meredith Pel, MD Inpatient   05/23/2014 1757 05/23/2014 1757 Full Code [id number]  Meredith Pel, MD Inpatient   08/26/2013 1422 09/08/2013 1910 DNR [id number]  Greggory Keen, MD Inpatient   08/25/2013 1801 08/26/2013 1422 DNR [id number]  Annita Brod, MD Inpatient   06/21/2013 2046 06/25/2013 1349 Full Code [id number]  Meredith Pel, MD Inpatient   06/21/2013 1813 06/21/2013 2046 Full Code [id number]  Theodoro Kos, DO Inpatient   06/17/2013 0225 06/21/2013 1813 Full Code [id number]  Meredith Pel, MD Inpatient   Advance Care Planning Activity    Advance Directive Documentation     Most Recent Value  Type of Advance Directive  Healthcare Power of Attorney  Pre-existing out of facility DNR order (yellow form or pink MOST form)  -  "MOST" Form in Place?  -     Family Communication: tried calling spouse, n/a=busy Disposition Plan:   Patient remained inpatient additional day on IV acyclovir.  Unable to return to assisted living facility with active shingles Consults called: None Admission status: Inpatient   Consultants:   None  Procedures:  Dg Chest Portable 1 View  Result Date: 10/07/2018 CLINICAL DATA:  Fever EXAM: PORTABLE CHEST 1 VIEW COMPARISON:  Jun 12, 2017 FINDINGS: The mediastinal contour is normal. Heart size is enlarged. There is no focal infiltrate, pulmonary edema, or pleural effusion. The bony structures are stable. IMPRESSION:  Cardiomegaly.  Lungs are clear. Electronically Signed   By: Abelardo Diesel M.D.   On: 10/07/2018 12:27     Antimicrobials:   IV acyclovir   Subjective: No acute events overnight Resting in bed reports no pain  Objective: Vitals:   10/09/18 2007 10/09/18 2015 10/10/18 0523 10/10/18 1158  BP: (!) 154/88  (!) 161/91 131/80  Pulse: (!) 48 60 95 66  Resp: 20  17 16   Temp: 97.8 F (36.6 C)     TempSrc: Oral     SpO2: 98%  97% 98%  Weight:      Height:         Intake/Output Summary (Last 24 hours) at 10/10/2018 1314 Last data filed at 10/10/2018 0827 Gross per 24 hour  Intake 1584.36 ml  Output 1000 ml  Net 584.36 ml   Filed Weights   10/07/18 1700 10/08/18 1600  Weight: 78 kg 74.3 kg    Examination:  General exam: Appears calm and comfortable  Respiratory system: Clear to auscultation. Respiratory effort normal. Cardiovascular system: S1 & S2 heard, RRR. No JVD, murmurs, rubs, gallops or clicks. No pedal edema. Gastrointestinal system: Abdomen is nondistended, soft and nontender. No organomegaly or masses felt. Normal bowel sounds heard. Central nervous system: Alert and oriented. No focal neurological deficits. Extremities: Warm well perfused no edema Skin: Vesicular lesions in various steps of drying Psychiatry: Judgement and insight appear normal. Mood & affect appropriate.     Data Reviewed: I have personally reviewed following labs and imaging studies  CBC: Recent Labs  Lab 10/07/18 1220 10/08/18 0557 10/09/18 0922  WBC 6.7 7.0 5.9  NEUTROABS 4.1 3.9 3.0  HGB 13.4 13.8 14.7  HCT 40.2 40.9 43.2  MCV 103.9* 102.8* 101.6*  PLT 169 155 [id number]   Basic Metabolic Panel: Recent Labs  Lab 10/07/18 1220 10/08/18 0557 10/09/18 0922 10/10/18 0931  NA 132* 134* 135 139  K 2.6* 2.5* 3.0* 3.8  CL 90* 95* 99 107  CO2 28 26 23  20*  GLUCOSE 111* 73 82 84  BUN 18 14 11 11   CREATININE 1.12* 0.84 0.62 0.64  CALCIUM 8.5* 8.0* 7.7* 8.3*   GFR: Estimated Creatinine Clearance: 47.7 mL/min (by C-G formula based on SCr of 0.64 mg/dL). Liver Function Tests: No results for input(s): AST, ALT, ALKPHOS, BILITOT, PROT, ALBUMIN in the last 168 hours. No results for input(s): LIPASE, AMYLASE in the last 168 hours. No results for input(s): AMMONIA in the last 168 hours. Coagulation Profile: No results for input(s): INR, PROTIME in the last 168 hours. Cardiac Enzymes: No results for input(s): CKTOTAL, CKMB, CKMBINDEX, TROPONINI in the last  168 hours. BNP (last 3 results) No results for input(s): PROBNP in the last 8760 hours. HbA1C: No results for input(s): HGBA1C in the last 72 hours. CBG: Recent Labs  Lab 10/09/18 1205 10/09/18 1706 10/09/18 2239 10/10/18 0815 10/10/18 1150  GLUCAP 70 170* 151* 92 104*   Lipid Profile: No results for input(s): CHOL, HDL, LDLCALC, TRIG, CHOLHDL, LDLDIRECT in the last 72 hours. Thyroid Function Tests: No results for input(s): TSH, T4TOTAL, FREET4, T3FREE, THYROIDAB in the last 72 hours. Anemia Panel: No results for input(s): VITAMINB12, FOLATE, FERRITIN, TIBC, IRON, RETICCTPCT in the last 72 hours. Sepsis Labs: Recent Labs  Lab 10/07/18 1219 10/07/18 1524  LATICACIDVEN 1.6 1.2    Recent Results (from the past 240 hour(s))  SARS Coronavirus 2 [facility] order, Performed in [facility] hospital lab) Nasopharyngeal Nasopharyngeal Swab     Status: None  Collection Time: 10/07/18 11:57 AM   Specimen: Nasopharyngeal Swab  Result Value Ref Range Status   SARS Coronavirus 2 NEGATIVE NEGATIVE Final    Comment: (NOTE) If result is NEGATIVE SARS-CoV-2 target nucleic acids are NOT DETECTED. The SARS-CoV-2 RNA is generally detectable in upper and lower  respiratory specimens during the acute phase of infection. The lowest  concentration of SARS-CoV-2 viral copies this assay can detect is 250  copies / mL. A negative result does not preclude SARS-CoV-2 infection  and should not be used as the sole basis for treatment or other  patient management decisions.  A negative result may occur with  improper specimen collection / handling, submission of specimen other  than nasopharyngeal swab, presence of viral mutation(s) within the  areas targeted by this assay, and inadequate number of viral copies  (<250 copies / mL). A negative result must be combined with clinical  observations, patient history, and epidemiological information. If result is POSITIVE SARS-CoV-2 target nucleic acids are  DETECTED. The SARS-CoV-2 RNA is generally detectable in upper and lower  respiratory specimens dur ing the acute phase of infection.  Positive  results are indicative of active infection with SARS-CoV-2.  Clinical  correlation with patient history and other diagnostic information is  necessary to determine patient infection status.  Positive results do  not rule out bacterial infection or co-infection with other viruses. If result is PRESUMPTIVE POSTIVE SARS-CoV-2 nucleic acids MAY BE PRESENT.   A presumptive positive result was obtained on the submitted specimen  and confirmed on repeat testing.  While 2019 novel coronavirus  (SARS-CoV-2) nucleic acids may be present in the submitted sample  additional confirmatory testing may be necessary for epidemiological  and / or clinical management purposes  to differentiate between  SARS-CoV-2 and other Sarbecovirus currently known to infect humans.  If clinically indicated additional testing with an alternate test  methodology [phone]) is advised. The SARS-CoV-2 RNA is generally  detectable in upper and lower respiratory sp ecimens during the acute  phase of infection. The expected result is Negative. Fact Sheet for Patients:  StrictlyIdeas.no Fact Sheet for Healthcare Providers: BankingDealers.co.za This test is not yet approved or cleared by the [place] FDA and has been authorized for detection and/or diagnosis of SARS-CoV-2 by FDA under an Emergency Use Authorization (EUA).  This EUA will remain in effect (meaning this test can be used) for the duration of the COVID-19 declaration under Section 564(b)(1) of the Act, 21 U.S.C. section 360bbb-3(b)(1), unless the authorization is terminated or revoked sooner. Performed at [facility] [place] [address], [place], [place] [address]   Culture, blood (routine x 2)     Status: None (Preliminary result)   Collection Time:  10/07/18 11:57 AM   Specimen: BLOOD  Result Value Ref Range Status   Specimen Description   Final    BLOOD LEFT HAND Performed at [facility] [address], [place], [place] [address]    Special Requests   Final    BOTTLES DRAWN AEROBIC AND ANAEROBIC Blood Culture results may not be optimal due to an inadequate volume of blood received in culture bottles Performed at [place] [address], [place], [place] [address]    Culture   Final    NO GROWTH 2 DAYS Performed at [facility] [address], [place], [place] [address]    Report Status PENDING  Incomplete  Culture, blood (routine x 2)     Status:  None (Preliminary result)   Collection Time: 10/07/18 11:57 AM   Specimen: BLOOD  Result Value Ref Range Status   Specimen Description   Final    BLOOD BLOOD LEFT FOREARM Performed at [facility] [address], [place], [place] [address]    Special Requests   Final    BOTTLES DRAWN AEROBIC AND ANAEROBIC Blood Culture results may not be optimal due to an inadequate volume of blood received in culture bottles Performed at [facility] [address] [place], [place] [address]    Culture   Final    NO GROWTH 2 DAYS Performed at [facility] [address], [place], [place] [address]    Report Status PENDING  Incomplete  MRSA PCR Screening     Status: None   Collection Time: 10/08/18  6:12 PM   Specimen: Nasal Mucosa; Nasopharyngeal  Result Value Ref Range Status   MRSA by PCR NEGATIVE NEGATIVE Final    Comment:        The GeneXpert MRSA Assay (FDA approved for NASAL specimens only), is one component of a comprehensive MRSA colonization surveillance program. It is not intended to diagnose MRSA infection nor to guide or monitor treatment for MRSA infections. Performed at [facility], [place] [address], [place], [place] [address]          Radiology  Studies: No results found.      Scheduled Meds: . anastrozole  1 mg Oral Daily  . apixaban  5 mg Oral BID  . atorvastatin  10 mg Oral QPM  . B-complex with vitamin C  1 tablet Oral Daily  . busPIRone  7.5 mg Oral TID  . cholecalciferol  1,000 Units Oral Daily  . diltiazem  120 mg Oral Daily  . docusate sodium  100 mg Oral BID  . insulin aspart  0-15 Units Subcutaneous TID WC  . insulin aspart  0-5 Units Subcutaneous QHS  . levothyroxine  25 mcg Oral QAC breakfast  . linaclotide  290 mcg Oral Daily  . metoprolol succinate  50 mg Oral BID  . multivitamin with minerals  1 tablet Oral Daily  . oxybutynin  5 mg Oral BID  . pantoprazole  40 mg Oral Daily  . potassium chloride SA  20 mEq Oral Daily  . predniSONE  20 mg Oral Q breakfast  . temazepam  30 mg Oral QHS  . vitamin C  500 mg Oral Daily   Continuous Infusions: . sodium chloride 75 mL/hr at 10/10/18 0257  . acyclovir 780 mg (10/10/18 1132)     LOS: 3 days    Time spent: 64 min    Nicolette Bang, MD [facility]  If 7PM-7AM, please contact night-coverage  10/10/2018, 1:14 PM

## 2018-10-11 LAB — GLUCOSE, CAPILLARY
Glucose-Capillary: 82 mg/dL (ref 70–99)
Glucose-Capillary: 99 mg/dL (ref 70–99)

## 2018-10-11 MED ORDER — VALACYCLOVIR HCL 1 G PO TABS: 1000.0000 mg | ORAL_TABLET | Freq: Three times a day (TID) | ORAL | 0 refills | Status: AC

## 2018-10-11 MED ORDER — HYDRALAZINE HCL 20 MG/ML IJ SOLN
10.0000 mg | Freq: Three times a day (TID) | INTRAMUSCULAR | Status: DC | PRN
  Filled 2018-10-11: qty 1

## 2018-10-11 MED ORDER — DEXTROSE 5 % IV SOLN
750.0000 mg | Freq: Three times a day (TID) | INTRAVENOUS | Status: DC
  Filled 2018-10-11: qty 15

## 2018-10-11 NOTE — Evaluation (Signed)
Physical Therapy One Time Evaluation Patient Details Name: [name] MRN: [id number] DOB: 02/16/1925 Today's Date: 10/11/2018   History of Present Illness  85 y.o. female with medical history significant for HTN, HLD, DM, Left breast cancer who presents by EMS from [facility] independent living secondary to right breast rash consistent with shingles  Clinical Impression  Patient evaluated by Physical Therapy with no further acute PT needs identified. All education has been completed and the patient has no further questions. Pt assisted with ambulating short distance around room.  Pt reports she usually uses rollator (4 wheels and seat) at baseline and feels she can return to ILF with her husband.  Pt would benefit from HHPT upon d/c if agreeable. See below for any follow-up Physical Therapy or equipment needs. PT is signing off. Thank you for this referral.     Follow Up Recommendations Home health PT;Supervision for mobility/OOB    Equipment Recommendations  None recommended by PT    Recommendations for Other Services       Precautions / Restrictions Precautions Precautions: Fall Precaution Comments: pt reports shingles on right flank Restrictions Weight Bearing Restrictions: No      Mobility  Bed Mobility Overal bed mobility: Needs Assistance Bed Mobility: Supine to Sit     Supine to sit: HOB elevated;Min guard     General bed mobility comments: provided a hand for pt to self assist to EOB  Transfers Overall transfer level: Needs assistance Equipment used: Rolling walker (2 wheeled) Transfers: Sit to/from Stand Sit to Stand: Min guard         General transfer comment: cues for hand placement, increased time and effort, min/guard for safety  Ambulation/Gait Ambulation/Gait assistance: Min guard Gait Distance (Feet): 9 Feet Assistive device: Rolling walker (2 wheeled) Gait Pattern/deviations: Decreased stride length;Step-through pattern;Narrow base of  support     General Gait Details: verbal cues for RW positioning, slow but steady; pt reports not ambulating prior to arrival and then states she does ambulate but must use rollator.  distance to tolerance within room  Stairs            Wheelchair Mobility    Modified Rankin (Stroke Patients Only)       Balance Overall balance assessment: Needs assistance         Standing balance support: Bilateral upper extremity supported Standing balance-Leahy Scale: Poor Standing balance comment: requires UEs upport                             Pertinent Vitals/Pain Pain Assessment: No/denies pain Pain Intervention(s): Monitored during session;Repositioned    Home Living   Living Arrangements: Spouse/significant other   Type of Home: Independent living facility       Home Layout: One level Home Equipment: Walker - 4 wheels      Prior Function Level of Independence: Independent with assistive device(s)         Comments: ambulates with rollator, meals brought to pt     Hand Dominance        Extremity/Trunk Assessment        Lower Extremity Assessment Lower Extremity Assessment: Generalized weakness       Communication   Communication: HOH  Cognition Arousal/Alertness: Awake/alert Behavior During Therapy: WFL for tasks assessed/performed Overall Cognitive Status: No family/caregiver present to determine baseline cognitive functioning  General Comments: appropriate however appears poor historian; follows commands      General Comments      Exercises     Assessment/Plan    PT Assessment All further PT needs can be met in the next venue of care  PT Problem List Decreased strength;Decreased mobility;Decreased activity tolerance;Decreased balance       PT Treatment Interventions      PT Goals (Current goals can be found in the Care Plan section)  Acute Rehab PT Goals PT Goal Formulation:  All assessment and education complete, DC therapy    Frequency     Barriers to discharge        Co-evaluation               AM-PAC PT "6 Clicks" Mobility  Outcome Measure Help needed turning from your back to your side while in a flat bed without using bedrails?: A Little Help needed moving from lying on your back to sitting on the side of a flat bed without using bedrails?: A Little Help needed moving to and from a bed to a chair (including a wheelchair)?: A Little Help needed standing up from a chair using your arms (e.g., wheelchair or bedside chair)?: A Little Help needed to walk in hospital room?: A Little Help needed climbing 3-5 steps with a railing? : A Lot 6 Click Score: 17    End of Session   Activity Tolerance: Patient limited by fatigue Patient left: with call bell/phone within reach;in chair;with chair alarm set Nurse Communication: Mobility status PT Visit Diagnosis: Difficulty in walking, not elsewhere classified (R26.2)    Time: 1130-1200 PT Time Calculation (min) (ACUTE ONLY): 30 min   Charges:   PT Evaluation $PT Eval Low Complexity: Gann, PT, DPT Acute Rehabilitation Services Office: [phone] Pager: [phone]  Trena Platt 10/11/2018, 12:24 PM

## 2018-10-11 NOTE — Discharge Summary (Signed)
Physician Discharge Summary  [name] [id number] DOB: [birth year] DOA: 10/07/2018  PCP: [name] Anger, MD  Admit date: 10/07/2018 Discharge date: 10/11/2018  Admitted From: Inpatient Disposition: home  Recommendations for Outpatient Follow-up:  1. Follow up with PCP in 1-2 weeks :  Home Health:No Equipment/Devices:none  Discharge Condition:Stable CODE STATUS:Full code Diet recommendation: resume preadmission diet  Brief/Interim Summary: [name] Maddaloni Kivettis a 85 y.o.femalewith medical history significant forHTN, HLD, DM, Left breast cancer who presents by EMS from [facility] independent living secondary to right breast rash consistent with shingles. Pt is a poor historian but reported a several day hx of "stinging" sensation 8/10 in her right breast and was seen by the provider there. She indicated she had a "rash" and had been feeling fatigued overall for several days. She was diagnosed with shingles and sent to the ER for further eval.  Hospital course: Herpes zoster: Patient's lesions are crusted over. She completed 5 days of acyclovir IV in the hospital should continue another 2 to 3 days to complete a week long course Cultures have been negative White blood cell count is normal  Hypothyroid: c/w home medication No evid of hormonal dysregulation  Hypokalemia: repleated with IV and PO in ER 3.8  Iron def anemia Follow cbc Continue home meds PCP to follow-up with iron panel  HTN: C/w home meds Titrate meds as indicated Isolated elevated blood pressure x1, will not add any aggressive treatment now Also follow-up as an outpatient  Atrial Flutter C/w nodal blocking agents eliquis   Hx beast cancer No acute intervention  Discharge Diagnoses:  Principal Problem:   Herpes zoster Active Problems:   Hypothyroidism   Iron deficiency anemia   Essential hypertension   Atrial flutter (HCC)   Malignant neoplasm of upper-outer quadrant of left  breast in female, estrogen receptor positive [facility])    Discharge Instructions  Discharge Instructions    Call MD for:   Complete by: As directed    [place] WORSENING RASH   Call MD for:  difficulty breathing, headache or visual disturbances   Complete by: As directed    Call MD for:  extreme fatigue   Complete by: As directed    Call MD for:  hives   Complete by: As directed    Call MD for:  persistant dizziness or light-headedness   Complete by: As directed    Call MD for:  persistant nausea and vomiting   Complete by: As directed    Call MD for:  redness, tenderness, or signs of infection (pain, swelling, redness, odor or green/yellow discharge around incision site)   Complete by: As directed    Call MD for:  severe uncontrolled pain   Complete by: As directed    Call MD for:  temperature >100.4   Complete by: As directed    Diet - low sodium heart healthy   Complete by: As directed    Increase activity slowly   Complete by: As directed      Allergies as of 10/11/2018      Reactions   Zosyn [piperacillin Sod-tazobactam So] Hives, Rash, Other (See Comments)   Morbilliform eruption with itching   Atenolol Nausea And Vomiting   Clarithromycin Nausea And Vomiting   Codeine Sulfate Nausea Only   Levaquin [levofloxacin] Nausea Only   Macrodantin Other (See Comments)   UNSPECIFIED    Oxycodone-acetaminophen Nausea And Vomiting   Percocet [oxycodone-acetaminophen] Nausea And Vomiting  Medication List    TAKE these medications   acetaminophen 500 MG tablet Commonly known as: TYLENOL Take 500 mg by mouth daily as needed for mild pain.   anastrozole 1 MG tablet Commonly known as: ARIMIDEX Take 1 tablet (1 mg total) by mouth daily. What changed: when to take this   apixaban 5 MG Tabs tablet Commonly known as: Eliquis Take 1 tablet (5 mg total) by mouth 2 (two) times daily.   atorvastatin 10 MG tablet Commonly known  as: LIPITOR Take 1 tablet (10 mg total) by mouth every evening.   B-complex with vitamin C tablet Take 1 tablet by mouth daily.   busPIRone 7.5 MG tablet Commonly known as: BUSPAR Take 1 tablet (7.5 mg total) by mouth 3 (three) times daily.   cholecalciferol 25 MCG (1000 UT) tablet Commonly known as: VITAMIN D Take 1,000 Units by mouth daily.   diltiazem 120 MG 24 hr capsule Commonly known as: CARDIZEM CD TAKE 1 CAPSULE (120 MG TOTAL) BY MOUTH DAILY. What changed:   how much to take  how to take this  when to take this  additional instructions   furosemide 40 MG tablet Commonly known as: LASIX Take 40 mg by mouth daily as needed for fluid or edema.   levothyroxine 25 MCG tablet Commonly known as: SYNTHROID TAKE 1 TABLET BY MOUTH DAILY FOR THYROID. What changed:   how much to take  how to take this  when to take this  additional instructions   linaclotide 290 MCG Caps capsule Commonly known as: LINZESS Take 1 capsule (290 mcg total) by mouth daily.   metoprolol succinate 50 MG 24 hr tablet Commonly known as: TOPROL-XL Take 1 tablet (50 mg total) by mouth 2 (two) times daily. Take with or immediately following a meal.   multivitamin with minerals tablet Take 1 tablet by mouth daily.   oxybutynin 5 MG tablet Commonly known as: DITROPAN Take 1 tablet (5 mg total) by mouth 2 (two) times daily.   polyethylene glycol powder 17 GM/SCOOP powder Commonly known as: GLYCOLAX/MIRALAX Take 17-34 g by mouth 2 (two) times daily as needed for moderate constipation.   potassium chloride SA 20 MEQ tablet Commonly known as: K-DUR Take 1 tablet by mouth only when you have to use a lasix   predniSONE 20 MG tablet Commonly known as: DELTASONE Take 1 tablet (20 mg total) by mouth daily with breakfast.   temazepam 30 MG capsule Commonly known as: RESTORIL Take 1 capsule (30 mg total) by mouth at bedtime.   valACYclovir 1000 MG tablet Commonly known as: VALTREX Take  1 tablet (1,000 mg total) by mouth 3 (three) times daily for 3 days.   vitamin C 500 MG tablet Commonly known as: ASCORBIC ACID Take 500 mg by mouth daily.   Zinc-220 220 (50 Zn) MG capsule Generic drug: zinc sulfate Take 220 mg by mouth daily.       Allergies  Allergen Reactions  . Zosyn [Piperacillin Sod-Tazobactam So] Hives, Rash and Other (See Comments)    Morbilliform eruption with itching  . Atenolol Nausea And Vomiting  . Clarithromycin Nausea And Vomiting  . Codeine Sulfate Nausea Only  . Levaquin [Levofloxacin] Nausea Only  . Macrodantin Other (See Comments)    UNSPECIFIED   . Oxycodone-Acetaminophen Nausea And Vomiting  . Percocet [Oxycodone-Acetaminophen] Nausea And Vomiting    Consultations:  None   Procedures/Studies: Dg Chest Portable 1 View  Result Date: 10/07/2018 CLINICAL DATA:  Fever EXAM: PORTABLE CHEST 1 VIEW  COMPARISON:  Jun 12, 2017 FINDINGS: The mediastinal contour is normal. Heart size is enlarged. There is no focal infiltrate, pulmonary edema, or pleural effusion. The bony structures are stable. IMPRESSION: Cardiomegaly.  Lungs are clear. Electronically Signed   By: Abelardo Diesel M.D.   On: 10/07/2018 12:27       Subjective: Patient doing well No pain associated with rash Feels ready to be discharged  Discharge Exam: Vitals:   10/10/18 2127 10/11/18 0520  BP: (!) 146/76 (!) 170/102  Pulse: 66 80  Resp: (!) 22 16  Temp: 97.7 F (36.5 C) (!) 97.3 F (36.3 C)  SpO2: 95% 98%   Vitals:   10/10/18 0523 10/10/18 1158 10/10/18 2127 10/11/18 0520  BP: (!) 161/91 131/80 (!) 146/76 (!) 170/102  Pulse: 95 66 66 80  Resp: 17 16 (!) 22 16  Temp:   97.7 F (36.5 C) (!) 97.3 F (36.3 C)  TempSrc:   Oral Oral  SpO2: 97% 98% 95% 98%  Weight:    74.3 kg  Height:        General: Pt is alert, awake, not in acute distress Cardiovascular: RRR, S1/S2 +, no rubs, no gallops Respiratory: CTA bilaterally, no wheezing, no rhonchi Abdominal: Soft,  NT, ND, bowel sounds + Extremities: no edema, no cyanosis  Skin: Right breast and flank rash consistent shingles, crusted over    The results of significant diagnostics from this hospitalization (including imaging, microbiology, ancillary and laboratory) are listed below for reference.     Microbiology: Recent Results (from the past 240 hour(s))  SARS Coronavirus 2 [facility] order, Performed in [facility] hospital lab) Nasopharyngeal Nasopharyngeal Swab     Status: None   Collection Time: 10/07/18 11:57 AM   Specimen: Nasopharyngeal Swab  Result Value Ref Range Status   SARS Coronavirus 2 NEGATIVE NEGATIVE Final    Comment: (NOTE) If result is NEGATIVE SARS-CoV-2 target nucleic acids are NOT DETECTED. The SARS-CoV-2 RNA is generally detectable in upper and lower  respiratory specimens during the acute phase of infection. The lowest  concentration of SARS-CoV-2 viral copies this assay can detect is 250  copies / mL. A negative result does not preclude SARS-CoV-2 infection  and should not be used as the sole basis for treatment or other  patient management decisions.  A negative result may occur with  improper specimen collection / handling, submission of specimen other  than nasopharyngeal swab, presence of viral mutation(s) within the  areas targeted by this assay, and inadequate number of viral copies  (<250 copies / mL). A negative result must be combined with clinical  observations, patient history, and epidemiological information. If result is POSITIVE SARS-CoV-2 target nucleic acids are DETECTED. The SARS-CoV-2 RNA is generally detectable in upper and lower  respiratory specimens dur ing the acute phase of infection.  Positive  results are indicative of active infection with SARS-CoV-2.  Clinical  correlation with patient history and other diagnostic information is  necessary to determine patient infection status.  Positive results do  not rule out bacterial infection or  co-infection with other viruses. If result is PRESUMPTIVE POSTIVE SARS-CoV-2 nucleic acids MAY BE PRESENT.   A presumptive positive result was obtained on the submitted specimen  and confirmed on repeat testing.  While 2019 novel coronavirus  (SARS-CoV-2) nucleic acids may be present in the submitted sample  additional confirmatory testing may be necessary for epidemiological  and / or clinical management purposes  to differentiate between  SARS-CoV-2 and other Sarbecovirus currently  known to infect humans.  If clinically indicated additional testing with an alternate test  methodology [phone]) is advised. The SARS-CoV-2 RNA is generally  detectable in upper and lower respiratory sp ecimens during the acute  phase of infection. The expected result is Negative. Fact Sheet for Patients:  StrictlyIdeas.no Fact Sheet for Healthcare Providers: BankingDealers.co.za This test is not yet approved or cleared by the [place] FDA and has been authorized for detection and/or diagnosis of SARS-CoV-2 by FDA under an Emergency Use Authorization (EUA).  This EUA will remain in effect (meaning this test can be used) for the duration of the COVID-19 declaration under Section 564(b)(1) of the Act, 21 U.S.C. section 360bbb-3(b)(1), unless the authorization is terminated or revoked sooner. Performed at [facility], [place] [address], [place], [place] [address]   Culture, blood (routine x 2)     Status: None (Preliminary result)   Collection Time: 10/07/18 11:57 AM   Specimen: BLOOD  Result Value Ref Range Status   Specimen Description   Final    BLOOD LEFT HAND Performed at [place] [address] [place], [place] [address]    Special Requests   Final    BOTTLES DRAWN AEROBIC AND ANAEROBIC Blood Culture results may not be optimal due to an inadequate volume of blood received in culture bottles Performed at  [place] [address], [place], [place] [address]    Culture   Final    NO GROWTH 4 DAYS Performed at [facility], [place] [address], [place], [place] [address]    Report Status PENDING  Incomplete  Culture, blood (routine x 2)     Status: None (Preliminary result)   Collection Time: 10/07/18 11:57 AM   Specimen: BLOOD  Result Value Ref Range Status   Specimen Description   Final    BLOOD BLOOD LEFT FOREARM Performed at [place] [address] [place], [place] [address]    Special Requests   Final    BOTTLES DRAWN AEROBIC AND ANAEROBIC Blood Culture results may not be optimal due to an inadequate volume of blood received in culture bottles Performed at [place] [address] [place], [place] [address]    Culture   Final    NO GROWTH 4 DAYS Performed at [facility], [place] [address] [place], [place] [address]    Report Status PENDING  Incomplete  MRSA PCR Screening     Status: None   Collection Time: 10/08/18  6:12 PM   Specimen: Nasal Mucosa; Nasopharyngeal  Result Value Ref Range Status   MRSA by PCR NEGATIVE NEGATIVE Final    Comment:        The GeneXpert MRSA Assay (FDA approved for NASAL specimens only), is one component of a comprehensive MRSA colonization surveillance program. It is not intended to diagnose MRSA infection nor to guide or monitor treatment for MRSA infections. Performed at [facility], [place] [address], [place], [place] [address]      Labs: BNP (last 3 results) No results for input(s): BNP in the last 8760 hours. Basic Metabolic Panel: Recent Labs  Lab 10/07/18 1220 10/08/18 0557 10/09/18 0922 10/10/18 0931  NA 132* 134* 135 139  K 2.6* 2.5* 3.0* 3.8  CL 90* 95* 99 107  CO2 28 26 23  20*  GLUCOSE 111* 73 82 84  BUN 18 14 11 11   CREATININE 1.12* 0.84 0.62 0.64  CALCIUM 8.5* 8.0*  7.7* 8.3*   Liver Function Tests: No results for  input(s): AST, ALT, ALKPHOS, BILITOT, PROT, ALBUMIN in the last 168 hours. No results for input(s): LIPASE, AMYLASE in the last 168 hours. No results for input(s): AMMONIA in the last 168 hours. CBC: Recent Labs  Lab 10/07/18 1220 10/08/18 0557 10/09/18 0922  WBC 6.7 7.0 5.9  NEUTROABS 4.1 3.9 3.0  HGB 13.4 13.8 14.7  HCT 40.2 40.9 43.2  MCV 103.9* 102.8* 101.6*  PLT 169 155 159   Cardiac Enzymes: No results for input(s): CKTOTAL, CKMB, CKMBINDEX, TROPONINI in the last 168 hours. BNP: Invalid input(s): POCBNP CBG: Recent Labs  Lab 10/10/18 1150 10/10/18 1739 10/10/18 2116 10/11/18 0914 10/11/18 1130  GLUCAP 104* 117* 136* 82 99   D-Dimer No results for input(s): DDIMER in the last 72 hours. Hgb A1c No results for input(s): HGBA1C in the last 72 hours. Lipid Profile No results for input(s): CHOL, HDL, LDLCALC, TRIG, CHOLHDL, LDLDIRECT in the last 72 hours. Thyroid function studies No results for input(s): TSH, T4TOTAL, T3FREE, THYROIDAB in the last 72 hours.  Invalid input(s): FREET3 Anemia work up No results for input(s): VITAMINB12, FOLATE, FERRITIN, TIBC, IRON, RETICCTPCT in the last 72 hours. Urinalysis    Component Value Date/Time   COLORURINE YELLOW 10/07/2018 1535   APPEARANCEUR CLEAR 10/07/2018 1535   LABSPEC 1.012 10/07/2018 1535   PHURINE 6.0 10/07/2018 1535   GLUCOSEU NEGATIVE 10/07/2018 1535   GLUCOSEU NEGATIVE 01/17/2015 1415   HGBUR NEGATIVE 10/07/2018 1535   BILIRUBINUR NEGATIVE 10/07/2018 1535   KETONESUR NEGATIVE 10/07/2018 1535   PROTEINUR 30 (A) 10/07/2018 1535   UROBILINOGEN 0.2 01/17/2015 1415   NITRITE NEGATIVE 10/07/2018 1535   LEUKOCYTESUR NEGATIVE 10/07/2018 1535   Sepsis Labs Invalid input(s): PROCALCITONIN,  WBC,  LACTICIDVEN Microbiology Recent Results (from the past 240 hour(s))  SARS Coronavirus 2 [facility] order, Performed in [facility] hospital lab) Nasopharyngeal Nasopharyngeal Swab     Status: None   Collection Time:  10/07/18 11:57 AM   Specimen: Nasopharyngeal Swab  Result Value Ref Range Status   SARS Coronavirus 2 NEGATIVE NEGATIVE Final    Comment: (NOTE) If result is NEGATIVE SARS-CoV-2 target nucleic acids are NOT DETECTED. The SARS-CoV-2 RNA is generally detectable in upper and lower  respiratory specimens during the acute phase of infection. The lowest  concentration of SARS-CoV-2 viral copies this assay can detect is 250  copies / mL. A negative result does not preclude SARS-CoV-2 infection  and should not be used as the sole basis for treatment or other  patient management decisions.  A negative result may occur with  improper specimen collection / handling, submission of specimen other  than nasopharyngeal swab, presence of viral mutation(s) within the  areas targeted by this assay, and inadequate number of viral copies  (<250 copies / mL). A negative result must be combined with clinical  observations, patient history, and epidemiological information. If result is POSITIVE SARS-CoV-2 target nucleic acids are DETECTED. The SARS-CoV-2 RNA is generally detectable in upper and lower  respiratory specimens dur ing the acute phase of infection.  Positive  results are indicative of active infection with SARS-CoV-2.  Clinical  correlation with patient history and other diagnostic information is  necessary to determine patient infection status.  Positive results do  not rule out bacterial infection or co-infection with other viruses. If result is PRESUMPTIVE POSTIVE SARS-CoV-2 nucleic acids MAY BE PRESENT.   A presumptive positive result was obtained on the submitted specimen  and confirmed  on repeat testing.  While 2019 novel coronavirus  (SARS-CoV-2) nucleic acids may be present in the submitted sample  additional confirmatory testing may be necessary for epidemiological  and / or clinical management purposes  to differentiate between  SARS-CoV-2 and other Sarbecovirus currently known to  infect humans.  If clinically indicated additional testing with an alternate test  methodology [phone]) is advised. The SARS-CoV-2 RNA is generally  detectable in upper and lower respiratory sp ecimens during the acute  phase of infection. The expected result is Negative. Fact Sheet for Patients:  StrictlyIdeas.no Fact Sheet for Healthcare Providers: BankingDealers.co.za This test is not yet approved or cleared by the [place] FDA and has been authorized for detection and/or diagnosis of SARS-CoV-2 by FDA under an Emergency Use Authorization (EUA).  This EUA will remain in effect (meaning this test can be used) for the duration of the COVID-19 declaration under Section 564(b)(1) of the Act, 21 U.S.C. section 360bbb-3(b)(1), unless the authorization is terminated or revoked sooner. Performed at [facility], [place] [address], [place], [place] [address]   Culture, blood (routine x 2)     Status: None (Preliminary result)   Collection Time: 10/07/18 11:57 AM   Specimen: BLOOD  Result Value Ref Range Status   Specimen Description   Final    BLOOD LEFT HAND Performed at [facility] [address] [place], [place] [address]    Special Requests   Final    BOTTLES DRAWN AEROBIC AND ANAEROBIC Blood Culture results may not be optimal due to an inadequate volume of blood received in culture bottles Performed at [place] [address], [place], [place] [address]    Culture   Final    NO GROWTH 4 DAYS Performed at [facility], [place] [address], [place], [place] [address]    Report Status PENDING  Incomplete  Culture, blood (routine x 2)     Status: None (Preliminary result)   Collection Time: 10/07/18 11:57 AM   Specimen: BLOOD  Result Value Ref Range Status   Specimen Description   Final    BLOOD BLOOD LEFT FOREARM Performed at [place]  [address] [place], [place] [address]    Special Requests   Final    BOTTLES DRAWN AEROBIC AND ANAEROBIC Blood Culture results may not be optimal due to an inadequate volume of blood received in culture bottles Performed at [place] [address], [place], [place] [address]    Culture   Final    NO GROWTH 4 DAYS Performed at [facility], [place] [address], [place], [place] [address]    Report Status PENDING  Incomplete  MRSA PCR Screening     Status: None   Collection Time: 10/08/18  6:12 PM   Specimen: Nasal Mucosa; Nasopharyngeal  Result Value Ref Range Status   MRSA by PCR NEGATIVE NEGATIVE Final    Comment:        The GeneXpert MRSA Assay (FDA approved for NASAL specimens only), is one component of a comprehensive MRSA colonization surveillance program. It is not intended to diagnose MRSA infection nor to guide or monitor treatment for MRSA infections. Performed at [facility], [place] [address] [place], [place] [address]      Time coordinating discharge: Over 30 minutes  SIGNED:   Nicolette Bang, MD  [facility] 10/11/2018, 11:36 AM Pager   If 7PM-7AM, please contact night-coverage [email or web address] Password [email or web address]

## 2018-10-11 NOTE — Care Management Important Message (Signed)
Important Message  Patient Details IM Letter given to [name] RN to present to the Patient Name: [name] MRN: [id number] Date of Birth: 11/27/1925   Medicare Important Message Given:  Yes     Kerin Salen 10/11/2018, 11:38 AM

## 2018-10-11 NOTE — Progress Notes (Signed)
OT Cancellation Note  Patient Details Name: BRISTOL GAUTHREAUX MRN: [id number] DOB: Mar 04, 1925   Cancelled Treatment:    Reason Eval/Treat Not Completed: OT screened, no needs identified, will sign off . Noted plan to go back  To [place]. Will defer OT needs back to [facility] if need arise   Kari Baars, [place] [phone] Office- [phone], Thereasa Parkin 10/11/2018, 2:08 PM

## 2018-10-11 NOTE — TOC Progression Note (Signed)
Transition of Care [facility]) - Progression Note    Patient Details  Name: [name] MRN: [id number] Date of Birth: Aug 19, 1925  [facility]) CM/SW Contact  Purcell Mouton, RN Phone Number: 10/11/2018, 1:49 PM  Clinical Narrative:    Pt from [place] and plan to return. Pt's granddaughter will transport pt back to [employer]. There are no HH needs at present time.    Expected Discharge Plan: [place]    Expected Discharge Plan and Services Expected Discharge Plan: [place]   Discharge Planning Services: CM Consult   Living arrangements for the past 2 months: [place] Expected Discharge Date: 10/11/18                                     Social Determinants of Health (SDOH) Interventions    Readmission Risk Interventions No flowsheet data found.

## 2018-10-12 ENCOUNTER — Telehealth: Payer: Self-pay | Admitting: *Deleted

## 2018-10-12 LAB — CULTURE, BLOOD (ROUTINE X 2)
Culture: NO GROWTH
Culture: NO GROWTH

## 2018-10-12 NOTE — Telephone Encounter (Signed)
Spoke to [employer] grand-daughter and HPOA. [name] states that patient was discharged back to [facility] home where she is currently being taking care of daily by the nursing staff. The patient has a scheduled appointment with PCP on 11/15/18 for a 4 month f/u and prolia injection. [name] states that the patient will f/u with PCP at that time since she is well supported currently. If any issues arise Ms. Goller will contact PCP and make an appointment.

## 2018-10-12 NOTE — Telephone Encounter (Signed)
error 

## 2018-10-15 ENCOUNTER — Emergency Department (HOSPITAL_COMMUNITY): Payer: Medicare Other

## 2018-10-15 ENCOUNTER — Ambulatory Visit (INDEPENDENT_AMBULATORY_CARE_PROVIDER_SITE_OTHER): Payer: Medicare Other | Admitting: Family

## 2018-10-15 ENCOUNTER — Emergency Department (HOSPITAL_COMMUNITY)
Admission: EM | Admit: 2018-10-15 | Discharge: 2018-10-16 | Disposition: A | Discharge status: Home / Self Care | Payer: Medicare Other | Attending: Emergency Medicine | Admitting: Emergency Medicine

## 2018-10-15 ENCOUNTER — Encounter (HOSPITAL_COMMUNITY): Payer: Self-pay

## 2018-10-15 ENCOUNTER — Encounter: Payer: Self-pay | Admitting: Family

## 2018-10-15 ENCOUNTER — Other Ambulatory Visit: Payer: Self-pay

## 2018-10-15 ENCOUNTER — Other Ambulatory Visit (INDEPENDENT_AMBULATORY_CARE_PROVIDER_SITE_OTHER): Payer: Medicare Other

## 2018-10-15 ENCOUNTER — Ambulatory Visit (INDEPENDENT_AMBULATORY_CARE_PROVIDER_SITE_OTHER)
Admission: RE | Admit: 2018-10-15 | Discharge: 2018-10-15 | Disposition: A | Discharge status: Home / Self Care | Payer: Medicare Other | Source: Ambulatory Visit | Attending: Family | Admitting: Family

## 2018-10-15 VITALS — BP 126/74 | HR 77 | Temp 98.3°F | Ht 65.5 in

## 2018-10-15 DIAGNOSIS — E039 Hypothyroidism, unspecified: Secondary | ICD-10-CM

## 2018-10-15 DIAGNOSIS — R140 Abdominal distension (gaseous): Secondary | ICD-10-CM | POA: Diagnosis not present

## 2018-10-15 DIAGNOSIS — K5900 Constipation, unspecified: Secondary | ICD-10-CM | POA: Diagnosis not present

## 2018-10-15 DIAGNOSIS — K5901 Slow transit constipation: Secondary | ICD-10-CM

## 2018-10-15 DIAGNOSIS — I110 Hypertensive heart disease with heart failure: Secondary | ICD-10-CM | POA: Insufficient documentation

## 2018-10-15 DIAGNOSIS — Z853 Personal history of malignant neoplasm of breast: Secondary | ICD-10-CM | POA: Diagnosis not present

## 2018-10-15 DIAGNOSIS — R109 Unspecified abdominal pain: Secondary | ICD-10-CM

## 2018-10-15 DIAGNOSIS — R0902 Hypoxemia: Secondary | ICD-10-CM | POA: Diagnosis not present

## 2018-10-15 DIAGNOSIS — R531 Weakness: Secondary | ICD-10-CM

## 2018-10-15 DIAGNOSIS — I509 Heart failure, unspecified: Secondary | ICD-10-CM | POA: Diagnosis not present

## 2018-10-15 DIAGNOSIS — R52 Pain, unspecified: Secondary | ICD-10-CM | POA: Diagnosis not present

## 2018-10-15 DIAGNOSIS — L03818 Cellulitis of other sites: Secondary | ICD-10-CM

## 2018-10-15 DIAGNOSIS — R21 Rash and other nonspecific skin eruption: Secondary | ICD-10-CM

## 2018-10-15 DIAGNOSIS — K8020 Calculus of gallbladder without cholecystitis without obstruction: Secondary | ICD-10-CM | POA: Diagnosis not present

## 2018-10-15 DIAGNOSIS — R1084 Generalized abdominal pain: Secondary | ICD-10-CM | POA: Diagnosis not present

## 2018-10-15 LAB — COMPREHENSIVE METABOLIC PANEL
ALT: 33 U/L (ref 0–35)
ALT: 39 U/L (ref 0–44)
AST: 22 U/L (ref 0–37)
AST: 27 U/L (ref 15–41)
Albumin: 3.7 g/dL (ref 3.5–5.2)
Albumin: 3.3 g/dL — ABNORMAL LOW (ref 3.5–5.0)
Alkaline Phosphatase: 59 U/L (ref 39–117)
Alkaline Phosphatase: 60 U/L (ref 38–126)
Anion gap: 11 (ref 5–15)
BUN: 16 mg/dL (ref 6–23)
BUN: 16 mg/dL (ref 8–23)
CO2: 28 mEq/L (ref 19–32)
CO2: 29 mmol/L (ref 22–32)
Calcium: 10.1 mg/dL (ref 8.4–10.5)
Calcium: 9.9 mg/dL (ref 8.9–10.3)
Chloride: 94 mEq/L — ABNORMAL LOW (ref 96–112)
Chloride: 95 mmol/L — ABNORMAL LOW (ref 98–111)
Creatinine, Ser: 1.39 mg/dL — ABNORMAL HIGH (ref 0.40–1.20)
Creatinine, Ser: 1.49 mg/dL — ABNORMAL HIGH (ref 0.44–1.00)
GFR calc Af Amer: 35 mL/min — ABNORMAL LOW (ref >60)
GFR calc non Af Amer: 31 mL/min — ABNORMAL LOW (ref >60)
GFR: 35.54 mL/min — ABNORMAL LOW (ref >60.00)
Glucose, Bld: 124 mg/dL — ABNORMAL HIGH (ref 70–99)
Glucose, Bld: 129 mg/dL — ABNORMAL HIGH (ref 70–99)
Potassium: 3.5 mEq/L (ref 3.5–5.1)
Potassium: 3.5 mmol/L (ref 3.5–5.1)
Sodium: 133 mEq/L — ABNORMAL LOW (ref 135–145)
Sodium: 135 mmol/L (ref 135–145)
Total Bilirubin: 1.2 mg/dL (ref 0.2–1.2)
Total Bilirubin: 1.4 mg/dL — ABNORMAL HIGH (ref 0.3–1.2)
Total Protein: 6.9 g/dL (ref 6.0–8.3)
Total Protein: 7 g/dL (ref 6.5–8.1)

## 2018-10-15 LAB — CBC WITH DIFFERENTIAL/PLATELET
Basophils Absolute: 0 10*3/uL (ref 0.0–0.1)
Basophils Relative: 0.6 % (ref 0.0–3.0)
Eosinophils Absolute: 0.1 10*3/uL (ref 0.0–0.7)
Eosinophils Relative: 1.7 % (ref 0.0–5.0)
HCT: 41.4 % (ref 36.0–46.0)
Hemoglobin: 13.9 g/dL (ref 12.0–15.0)
Lymphocytes Relative: 25.2 % (ref 12.0–46.0)
Lymphs Abs: 1.6 10*3/uL (ref 0.7–4.0)
MCHC: 33.7 g/dL (ref 30.0–36.0)
MCV: 104.5 fl — ABNORMAL HIGH (ref 78.0–100.0)
Monocytes Absolute: 0.8 10*3/uL (ref 0.1–1.0)
Monocytes Relative: 12.9 % — ABNORMAL HIGH (ref 3.0–12.0)
Neutro Abs: 3.8 10*3/uL (ref 1.4–7.7)
Neutrophils Relative %: 59.6 % (ref 43.0–77.0)
Platelets: 282 10*3/uL (ref 150.0–400.0)
RBC: 3.96 Mil/uL (ref 3.87–5.11)
RDW: 13.4 % (ref 11.5–15.5)
WBC: 6.4 10*3/uL (ref 4.0–10.5)

## 2018-10-15 LAB — CBC
HCT: 40.4 % (ref 36.0–46.0)
Hemoglobin: 14.2 g/dL (ref 12.0–15.0)
MCH: 37.1 pg — ABNORMAL HIGH (ref 26.0–34.0)
MCHC: 35.1 g/dL (ref 30.0–36.0)
MCV: 105.5 fL — ABNORMAL HIGH (ref 80.0–100.0)
Platelets: 278 10*3/uL (ref 150–400)
RBC: 3.83 MIL/uL — ABNORMAL LOW (ref 3.87–5.11)
RDW: 12.8 % (ref 11.5–15.5)
WBC: 6.8 10*3/uL (ref 4.0–10.5)
nRBC: 0.9 % — ABNORMAL HIGH (ref 0.0–0.2)

## 2018-10-15 LAB — LIPASE, BLOOD: Lipase: 25 U/L (ref 11–51)

## 2018-10-15 LAB — TSH: TSH: 0.99 u[IU]/mL (ref 0.35–4.50)

## 2018-10-15 MED ORDER — SODIUM CHLORIDE 0.9% FLUSH
3.0000 mL | Freq: Once | INTRAVENOUS | Status: AC
  Administered 2018-10-15: 3 mL via INTRAVENOUS

## 2018-10-15 MED ORDER — SULFAMETHOXAZOLE-TRIMETHOPRIM 800-160 MG PO TABS: 1.0000 | ORAL_TABLET | Freq: Two times a day (BID) | ORAL | 0 refills | Status: AC

## 2018-10-15 MED ORDER — IOHEXOL 300 MG/ML  SOLN
75.0000 mL | Freq: Once | INTRAMUSCULAR | Status: AC | PRN
  Administered 2018-10-15: 75 mL via INTRAVENOUS

## 2018-10-15 MED ORDER — POLYETHYLENE GLYCOL 3350 17 G PO PACK: 17.0000 g | PACK | Freq: Every day | ORAL | 0 refills | Status: DC

## 2018-10-15 NOTE — Discharge Instructions (Addendum)
You were evaluated in the Emergency Department and after careful evaluation, we did not find any emergent condition requiring admission or further testing in the hospital.  It is important that you take the stool softener multiple times a day in order to achieve consistent soft stool at home.  Please take the antibiotic for your rash as directed.  Please return to the Emergency Department if you experience any worsening of your condition.  We encourage you to follow up with a primary care provider.  Thank you for allowing [place] to be a part of your care.

## 2018-10-15 NOTE — ED Triage Notes (Signed)
Pt arrives with ems for lebaur for possible bowel obstruction. Pt c.o severe abd pain, last Bm Monday. Recent admission for shingles. Pt denies nausea/vomiting. Pt alert.

## 2018-10-15 NOTE — ED Provider Notes (Signed)
[facility] Emergency Department Provider Note MRN:  [id number]  Arrival date & time: 10/15/18     Chief Complaint   Abdominal Pain and Constipation   History of Present Illness   [name] is a 85 y.o. year-old female with a history of CHF, ischemic colitis presenting to the ED with chief complaint of abdominal pain.  1 week of constipation and slowly worsening diffuse abdominal discomfort.  Nausea but no vomiting.  Denies fever.  No dysuria.  Pain in the rectum as well.  Worsening pain to her shingles rash as well.  Sent here from clinic.  Symptoms constant, worsening, moderate in severity.  Review of Systems  A complete 10 system review of systems was obtained and all systems are negative except as noted in the HPI and PMH.   Patient's Health History    Past Medical History:  Diagnosis Date   Anxiety    denies   Atrial flutter ([place])    Bilateral edema of lower extremity    Breast cancer (HCC)    left breast   Chronic constipation    Chronic diastolic CHF (congestive heart failure) (HCC)    Diverticulosis of colon    GERD (gastroesophageal reflux disease)    H/O hiatal hernia    History of cellulitis    LEFT LOWER LEG --  SEPT 2015   History of colitis    DX  2003  WITH ISCHEMIC COLITIS   History of diverticulitis of colon    perferated diverticulitis w/ abscess 08-25-2013 drain placed --  resolved without surgical intervention   History of GI bleed    2011--- UPPER SECONARY GASTRIC ULCER FROM NSAID USE   History of Helicobacter pylori infection    2011   History of ulcer of lower limb    2012   RIGHT LOWER EXTREMITIY--  STATSIS ULCER   Hyperlipidemia    Hypertension    Hypothyroidism    Iron deficiency anemia 2011   elevated MCV   LBP (low back pain)    severe lumbar spondylosis s/p L3/L4,L4/L5 fusion 12/10   Lumbar spondylolysis    OA (osteoarthritis)    "hands" (08/25/2013)   Open wound of left lower leg      Osteopenia    Persistent atrial fibrillation    CARDIOLOGIST-   DR MCALANY   RBBB    Urinary frequency     Past Surgical History:  Procedure Laterality Date   APPLICATION OF A-CELL OF EXTREMITY Left 06/21/2013   Procedure: APPLICATION OF A-CELL OF EXTREMITY;  Surgeon: Theodoro Kos, DO;  Location: [place];  Service: Plastics;  Laterality: Left;   APPLICATION OF A-CELL OF EXTREMITY Left 08/22/2013   Procedure: APPLICATION OF A-CELL/VAC TO LOWER LEFT LEG WOUND;  Surgeon: Theodoro Kos, DO;  Location: [place];  Service: Plastics;  Laterality: Left;   APPLICATION OF A-CELL OF EXTREMITY Left 02/06/2014   Procedure: WITH PLACEMENT OF A -CELL ;  Surgeon: Theodoro Kos, DO;  Location: [place];  Service: Plastics;  Laterality: Left;   APPLICATION OF A-CELL OF EXTREMITY Left 08/09/2014   Procedure: PLACEMENT OF A CELL;  Surgeon: Theodoro Kos, DO;  Location: [place];  Service: Plastics;  Laterality: Left;   APPLICATION OF WOUND VAC Left 06/16/2013   Procedure: APPLICATION OF WOUND VAC;  Surgeon: Meredith Pel, MD;  Location: [place];  Service: Orthopedics;  Laterality: Left;   APPLICATION OF WOUND VAC Left 06/21/2013   Procedure: APPLICATION OF WOUND VAC;  Surgeon: Theodoro Kos, DO;  Location: [place];  Service: Plastics;  Laterality: Left;   BIOPSY BREAST  07/25/2015   LEFT RADIOACTIVE SEED GUIDED EXCISIONAL BREAST BIOPSY (Left) as a surgical intervention   EXTERNAL FIXATION LEG Left 06/16/2013   Procedure: EXTERNAL FIXATION LEG;  Surgeon: Meredith Pel, MD;  Location: [place];  Service: Orthopedics;  Laterality: Left;   EXTERNAL FIXATION REMOVAL Left 06/21/2013   Procedure: REMOVAL EXTERNAL FIXATION LEG;  Surgeon: Meredith Pel, MD;  Location: [place];  Service: Orthopedics;  Laterality: Left;   EYE SURGERY Bilateral    cataract removal   HARDWARE REMOVAL Left 05/23/2014   Procedure: HARDWARE REMOVAL;  Surgeon: Meredith Pel, MD;  Location: [place];  Service: Orthopedics;  Laterality: Left;  REMOVAL OF HARDWARE LEFT TIBIA   I&D EXTREMITY Left 06/16/2013   Procedure: IRRIGATION AND DEBRIDEMENT EXTREMITY;  Surgeon: Meredith Pel, MD;  Location: [place];  Service: Orthopedics;  Laterality: Left;   I&D EXTREMITY Left 02/06/2014   Procedure: IRRIGATION AND DEBRIDEMENT LEFT LEG WOUND ;  Surgeon: Theodoro Kos, DO;  Location: [place];  Service: Plastics;  Laterality: Left;   I&D EXTREMITY Left 08/09/2014   Procedure: IRRIGATION AND DEBRIDEMENT  OF LEFT LOWER LEG;  Surgeon: Theodoro Kos, DO;  Location: [place];  Service: Plastics;  Laterality: Left;   LUMBAR FUSION  01-02-2009   L3-L5   RADIOACTIVE SEED GUIDED EXCISIONAL BREAST BIOPSY Left 07/25/2015   Procedure: LEFT RADIOACTIVE SEED GUIDED EXCISIONAL BREAST BIOPSY;  Surgeon: Rolm Bookbinder, MD;  Location: [place];  Service: General;  Laterality: Left;   RE-EXCISION OF BREAST LUMPECTOMY Left 09/05/2015   Procedure: RE-EXCISION OF LEFT BREAST LUMPECTOMY;  Surgeon: Rolm Bookbinder, MD;  Location: [place];  Service: General;  Laterality: Left;   SHOULDER OPEN ROTATOR CUFF REPAIR Left 1997   TIBIA IM NAIL INSERTION Left 06/21/2013   Procedure: INTRAMEDULLARY (IM) NAIL TIBIAL;  Surgeon: Meredith Pel, MD;  Location: [place];  Service: Orthopedics;  Laterality: Left;   TONSILLECTOMY  AS CHILD   TOTAL HIP ARTHROPLASTY Right 06-07-2010   TRANSTHORACIC ECHOCARDIOGRAM  09-11-2010   DR MCALHANY   LVSF  55-60%/  MILD MV CALCIFICATION WITH NO STENOSIS/  MILD MR & TR / MODERATE LAE/  MODERATE TO SEVERE RAE   UPPER GI ENDOSCOPY  03-29-2010    Family History  Problem Relation Age of Onset   Cancer Father    Hypertension Other     Social History   Socioeconomic History   Marital status: Married    Spouse name: Not on file   Number of children: 2   Years of education: Not on file   Highest education level: Not on file  Occupational History   Occupation: retired     Fish farm manager: UNEMPLOYED  Social Needs   Emergency planning/management officer strain: Not on file   Food insecurity    Worry: Not on file    Inability: Not on file   Transportation needs    Medical: Not on file    Non-medical: Not on file  Tobacco Use   Smoking status: Never Smoker   Smokeless tobacco: Never Used  Substance and Sexual Activity   Alcohol use: No    Comment: 1 glass occ wine 08/03/14- none in a year   Drug use: No   Sexual activity: Not on file  Lifestyle   Physical activity    Days per week: Not on file    Minutes per session: Not on file  Stress: Not on file  Relationships   Social connections    Talks on phone: Not on file    Gets together: Not on file    Attends religious service: Not on file    Active member of club or organization: Not on file    Attends meetings of clubs or organizations: Not on file    Relationship status: Not on file   Intimate partner violence    Fear of current or ex partner: Not on file    Emotionally abused: Not on file    Physically abused: Not on file    Forced sexual activity: Not on file  Other Topics Concern   Not on file  Social History Narrative   Not on file     Physical Exam  Vital Signs and Nursing Notes reviewed Vitals:   10/15/18 2200 10/15/18 2215  BP: (!) 143/68 (!) 152/79  Pulse:    Resp:    Temp:    SpO2:      CONSTITUTIONAL: Chronically ill-appearing, NAD NEURO:  Alert and oriented x 3, no focal deficits EYES:  eyes equal and reactive ENT/NECK:  no LAD, no JVD CARDIO: Regular rate, well-perfused, normal S1 and S2 PULM:  CTAB no wheezing or rhonchi GI/GU:  normal bowel sounds, non-distended, mild diffuse abdominal tenderness MSK/SPINE:  No gross deformities, no edema SKIN:  no rash, atraumatic PSYCH:  Appropriate speech and behavior  Diagnostic and Interventional Summary    Labs Reviewed  COMPREHENSIVE METABOLIC PANEL - Abnormal; Notable for the following components:      Result Value    Chloride 95 (*)    Glucose, Bld 129 (*)    Creatinine, Ser 1.49 (*)    Albumin 3.3 (*)    Total Bilirubin 1.4 (*)    GFR calc non Af Amer 31 (*)    GFR calc Af Amer 35 (*)    All other components within normal limits  CBC - Abnormal; Notable for the following components:   RBC 3.83 (*)    MCV 105.5 (*)    MCH 37.1 (*)    nRBC 0.9 (*)    All other components within normal limits  LIPASE, BLOOD  URINALYSIS, ROUTINE W REFLEX MICROSCOPIC    CT ABDOMEN PELVIS W CONTRAST  Final Result      Medications  sodium chloride flush (NS) 0.9 % injection 3 mL (3 mLs Intravenous Given 10/15/18 1933)  iohexol (OMNIPAQUE) 300 MG/ML solution 75 mL (75 mLs Intravenous Contrast Given 10/15/18 2009)     Fecal disimpaction  Date/Time: 10/15/2018 11:00 PM Performed by: Maudie Flakes, MD Authorized by: Maudie Flakes, MD  Consent: Verbal consent obtained. Risks and benefits: risks, benefits and alternatives were discussed Consent given by: patient Local anesthesia used: no  Anesthesia: Local anesthesia used: no  Sedation: Patient sedated: no  Patient tolerance: patient tolerated the procedure well with no immediate complications Comments: Soft stool in the rectal vault adhered to the wall of the colon      Critical Care  ED Course and Medical Decision Making  I have reviewed the triage vital signs and the nursing notes.  Pertinent labs & imaging results that were available during my care of the patient were reviewed by me and considered in my medical decision making (see below for details).  Suspect fecal impaction but cannot exclude small bowel obstruction given patient's complicated abdominal history.  Will attempt fecal disimpaction and obtain CT imaging.  Will obtain chaperone to perform breast exam and  to perform disimpaction, office note today concern for bacterial secondary infection of her shingles site.  Fecal disimpaction attempted as described above, not classically impacted  with hard stool but rather has copious soft stool adhered to the rectum.  The stool was swept away from the rectal wall and patient was able to have a bowel movement here in the emergency department.  Patient's rash is largely well-healing, small area of increased erythema that could suggest a secondary bacterial infection.  Will treat with Bactrim given patient's allergies.  Patient CT is otherwise without acute or significant findings.  Mild inflammation related to constipation but patient is without fever, normal vital signs, benign abdominal exam.  Treatment is stool softeners, which can be managed as an outpatient.  Appropriate for discharge.    Barth Kirks. Sedonia Small, [place] [email or web address]  Final Clinical Impressions(s) / ED Diagnoses     ICD-10-CM   1. Constipation, unspecified constipation type  K59.00   2. Rash  R21     ED Discharge Orders         Ordered    sulfamethoxazole-trimethoprim (BACTRIM DS) 800-160 MG tablet  2 times daily     10/15/18 2258    polyethylene glycol (MIRALAX) 17 g packet  Daily     10/15/18 2258          Discharge Instructions Discussed with and Provided to Patient:   Discharge Instructions     You were evaluated in the Emergency Department and after careful evaluation, we did not find any emergent condition requiring admission or further testing in the hospital.  It is important that you take the stool softener multiple times a day in order to achieve consistent soft stool at home.  Please take the antibiotic for your rash as directed.  Please return to the Emergency Department if you experience any worsening of your condition.  We encourage you to follow up with a primary care provider.  Thank you for allowing [place] to be a part of your care.        Maudie Flakes, MD 10/15/18 [phone]

## 2018-10-15 NOTE — Progress Notes (Signed)
[name] is a 85 y.o. female with the following history as recorded in EpicCare:  Patient Active Problem List   Diagnosis Date Noted  . Herpes zoster 10/07/2018  . Chronic osteomyelitis of left tibia ([place]) 05/23/2017  . Community acquired pneumonia of left lung   . Osteoporosis 08/22/2016  . Malignant neoplasm of upper-outer quadrant of left breast in female, estrogen receptor positive ([place]) 08/10/2015  . Breast mass in female 07/25/2015  . Left breast mass 07/05/2015  . Incontinence in female 07/05/2014  . Edema 01/17/2014  . Morbilliform rash 09/06/2013  . Sepsis ([place]) 08/25/2013  . Metabolic acidosis 97/02/6376  . Diverticulitis of colon with perforation 08/25/2013  . Constipation 06/22/2013  . Open tibial fracture 06/17/2013  . Encounter for therapeutic drug monitoring 02/16/2013  . Well adult exam 07/21/2012  . Paresthesia 01/28/2012  . Chronic venous insufficiency 11/11/2010  . Actinic keratoses 05/30/2010  . Long term current use of anticoagulant 03/12/2010  . PEPTIC ULCER DISEASE 11/22/2009  . Iron deficiency anemia 10/31/2009  . HIP PAIN 10/31/2009  . Hypothyroidism 10/25/2009  . Disorder resulting from impaired renal function 03/19/2009  . INSOMNIA, PERSISTENT 11/07/2008  . Low back pain 09/04/2008  . Atrial flutter ([place]) 05/31/2008  . Essential hypertension 11/30/2006  . Osteoarthritis 11/30/2006  . Disorder of bone and cartilage 11/30/2006    No current facility-administered medications for this visit.    Current Outpatient Medications  Medication Sig Dispense Refill  . acetaminophen (TYLENOL) 500 MG tablet Take 500 mg by mouth daily as needed for mild pain.     [name] anastrozole (ARIMIDEX) 1 MG tablet Take 1 tablet (1 mg total) by mouth daily. (Patient taking differently: Take 1 mg by mouth at bedtime. ) 90 tablet 3  . apixaban (ELIQUIS) 5 MG TABS tablet Take 1 tablet (5 mg total) by mouth 2 (two) times daily. 180 tablet 3  . atorvastatin (LIPITOR) 10 MG  tablet Take 1 tablet (10 mg total) by mouth every evening. 90 tablet 1  . B Complex-C (B-COMPLEX WITH VITAMIN C) tablet Take 1 tablet by mouth daily.    . busPIRone (BUSPAR) 7.5 MG tablet Take 1 tablet (7.5 mg total) by mouth 3 (three) times daily. 270 tablet 1  . Cholecalciferol (VITAMIN D3) 1000 UNITS tablet Take 1,000 Units by mouth daily.      [name] diltiazem (CARDIZEM CD) 120 MG 24 hr capsule TAKE 1 CAPSULE (120 MG TOTAL) BY MOUTH DAILY. (Patient taking differently: Take 120 mg by mouth daily. ) 90 capsule 1  . furosemide (LASIX) 40 MG tablet Take 40 mg by mouth daily as needed for fluid or edema.     [name] levothyroxine (SYNTHROID) 25 MCG tablet TAKE 1 TABLET BY MOUTH DAILY FOR THYROID. (Patient taking differently: Take 25 mcg by mouth daily before breakfast. ) 30 tablet 11  . linaclotide (LINZESS) 290 MCG CAPS capsule Take 1 capsule (290 mcg total) by mouth daily. 90 capsule 3  . metoprolol succinate (TOPROL-XL) 50 MG 24 hr tablet Take 1 tablet (50 mg total) by mouth 2 (two) times daily. Take with or immediately following a meal. 180 tablet 1  . Multiple Vitamins-Minerals (MULTIVITAMIN WITH MINERALS) tablet Take 1 tablet by mouth daily.    [name] oxybutynin (DITROPAN) 5 MG tablet Take 1 tablet (5 mg total) by mouth 2 (two) times daily. 180 tablet 1  . polyethylene glycol powder (GLYCOLAX/MIRALAX) powder Take 17-34 g by mouth 2 (two) times daily as needed for moderate constipation. 850 g 5  .  potassium chloride SA (K-DUR) 20 MEQ tablet Take 1 tablet by mouth only when you have to use a lasix 90 tablet 3  . predniSONE (DELTASONE) 20 MG tablet Take 1 tablet (20 mg total) by mouth daily with breakfast. 5 tablet 0  . temazepam (RESTORIL) 30 MG capsule Take 1 capsule (30 mg total) by mouth at bedtime. 30 capsule 5  . vitamin C (ASCORBIC ACID) 500 MG tablet Take 500 mg by mouth daily.    [name] zinc sulfate (ZINC-220) 220 (50 Zn) MG capsule Take 220 mg by mouth daily.     Facility-Administered Medications Ordered in  Other Visits  Medication Dose Route Frequency Provider Last Rate Last Dose  . sodium chloride flush (NS) 0.9 % injection 3 mL  3 mL Intravenous Once Deno Etienne, DO        Allergies: Zosyn [piperacillin sod-tazobactam so], Atenolol, Clarithromycin, Codeine sulfate, Levaquin [levofloxacin], Macrodantin, Oxycodone-acetaminophen, and Percocet [oxycodone-acetaminophen]  Past Medical History:  Diagnosis Date  . Anxiety    denies  . Atrial flutter ([place])   . Bilateral edema of lower extremity   . Breast cancer ([place])    left breast  . Chronic constipation   . Chronic diastolic CHF (congestive heart failure) ([place])   . Diverticulosis of colon   . GERD (gastroesophageal reflux disease)   . H/O hiatal hernia   . History of cellulitis    LEFT LOWER LEG --  SEPT 2015  . History of colitis    DX  2003  WITH ISCHEMIC COLITIS  . History of diverticulitis of colon    perferated diverticulitis w/ abscess 08-25-2013 drain placed --  resolved without surgical intervention  . History of GI bleed    2011--- UPPER SECONARY GASTRIC ULCER FROM NSAID USE  . History of Helicobacter pylori infection    2011  . History of ulcer of lower limb    2012   RIGHT LOWER EXTREMITIY--  STATSIS ULCER  . Hyperlipidemia   . Hypertension   . Hypothyroidism   . Iron deficiency anemia 2011   elevated MCV  . LBP (low back pain)    severe lumbar spondylosis s/p L3/L4,L4/L5 fusion 12/10  . Lumbar spondylolysis   . OA (osteoarthritis)    "hands" (08/25/2013)  . Open wound of left lower leg   . Osteopenia   . Persistent atrial fibrillation    CARDIOLOGIST-   DR Burman Foster  . RBBB   . Urinary frequency     Past Surgical History:  Procedure Laterality Date  . APPLICATION OF A-CELL OF EXTREMITY Left 06/21/2013   Procedure: APPLICATION OF A-CELL OF EXTREMITY;  Surgeon: Theodoro Kos, DO;  Location: [place];  Service: Plastics;  Laterality: Left;  . APPLICATION OF A-CELL OF EXTREMITY Left 08/22/2013   Procedure: APPLICATION OF  A-CELL/VAC TO LOWER LEFT LEG WOUND;  Surgeon: Theodoro Kos, DO;  Location: [place];  Service: Plastics;  Laterality: Left;  . APPLICATION OF A-CELL OF EXTREMITY Left 02/06/2014   Procedure: WITH PLACEMENT OF A -CELL ;  Surgeon: Theodoro Kos, DO;  Location: [place];  Service: Plastics;  Laterality: Left;  . APPLICATION OF A-CELL OF EXTREMITY Left 08/09/2014   Procedure: PLACEMENT OF A CELL;  Surgeon: Theodoro Kos, DO;  Location: [place];  Service: Plastics;  Laterality: Left;  . APPLICATION OF WOUND VAC Left 06/16/2013   Procedure: APPLICATION OF WOUND VAC;  Surgeon: Meredith Pel, MD;  Location: [place];  Service: Orthopedics;  Laterality: Left;  . APPLICATION  OF WOUND VAC Left 06/21/2013   Procedure: APPLICATION OF WOUND VAC;  Surgeon: Theodoro Kos, DO;  Location: [place];  Service: Plastics;  Laterality: Left;  . BIOPSY BREAST  07/25/2015   LEFT RADIOACTIVE SEED GUIDED EXCISIONAL BREAST BIOPSY (Left) as a surgical intervention  . EXTERNAL FIXATION LEG Left 06/16/2013   Procedure: EXTERNAL FIXATION LEG;  Surgeon: Meredith Pel, MD;  Location: [place];  Service: Orthopedics;  Laterality: Left;  . EXTERNAL FIXATION REMOVAL Left 06/21/2013   Procedure: REMOVAL EXTERNAL FIXATION LEG;  Surgeon: Meredith Pel, MD;  Location: [place];  Service: Orthopedics;  Laterality: Left;  . EYE SURGERY Bilateral    cataract removal  . HARDWARE REMOVAL Left 05/23/2014   Procedure: HARDWARE REMOVAL;  Surgeon: Meredith Pel, MD;  Location: [place];  Service: Orthopedics;  Laterality: Left;  REMOVAL OF HARDWARE LEFT TIBIA  . I&D EXTREMITY Left 06/16/2013   Procedure: IRRIGATION AND DEBRIDEMENT EXTREMITY;  Surgeon: Meredith Pel, MD;  Location: [place];  Service: Orthopedics;  Laterality: Left;  . I&D EXTREMITY Left 02/06/2014   Procedure: IRRIGATION AND DEBRIDEMENT LEFT LEG WOUND ;  Surgeon: Theodoro Kos, DO;  Location: [place];  Service: Plastics;   Laterality: Left;  . I&D EXTREMITY Left 08/09/2014   Procedure: IRRIGATION AND DEBRIDEMENT  OF LEFT LOWER LEG;  Surgeon: Theodoro Kos, DO;  Location: [place];  Service: Plastics;  Laterality: Left;  . LUMBAR FUSION  01-02-2009   L3-L5  . RADIOACTIVE SEED GUIDED EXCISIONAL BREAST BIOPSY Left 07/25/2015   Procedure: LEFT RADIOACTIVE SEED GUIDED EXCISIONAL BREAST BIOPSY;  Surgeon: Rolm Bookbinder, MD;  Location: [place];  Service: General;  Laterality: Left;  . RE-EXCISION OF BREAST LUMPECTOMY Left 09/05/2015   Procedure: RE-EXCISION OF LEFT BREAST LUMPECTOMY;  Surgeon: Rolm Bookbinder, MD;  Location: [place];  Service: General;  Laterality: Left;  . SHOULDER OPEN ROTATOR CUFF REPAIR Left 1997  . TIBIA IM NAIL INSERTION Left 06/21/2013   Procedure: INTRAMEDULLARY (IM) NAIL TIBIAL;  Surgeon: Meredith Pel, MD;  Location: [place];  Service: Orthopedics;  Laterality: Left;  . TONSILLECTOMY  AS CHILD  . TOTAL HIP ARTHROPLASTY Right 06-07-2010  . TRANSTHORACIC ECHOCARDIOGRAM  09-11-2010   DR MCALHANY   LVSF  55-60%/  MILD MV CALCIFICATION WITH NO STENOSIS/  MILD MR & TR / MODERATE LAE/  MODERATE TO SEVERE RAE  . UPPER GI ENDOSCOPY  03-29-2010    Family History  Problem Relation Age of Onset  . Cancer Father   . Hypertension Other     Social History   Tobacco Use  . Smoking status: Never Smoker  . Smokeless tobacco: Never Used  Substance Use Topics  . Alcohol use: No    Comment: 1 glass occ wine 08/03/14- none in a year    Subjective:  Patient is brought to the office by her granddaughter. Was admitted to the hospital last week with concerns for dehydration/ complications from Shingles; family is concerned that patient just does not seem to be feeling very well; is concerned that as the week has gone on, the patient has just not done well; decreased appetite, constipation- unfortunately, patient was not on her Linzess for most of this week; per granddaughter, patient just seemed to stop eating  yesterday- per patient, drinking juice;  Patient offers very little history during office visit; notes that she "just doesn't feel good" because she has not been able to go to through the bathroom all week.  Objective:  Vitals:   10/15/18 1354  BP: 126/74  Pulse: 77  Temp: 98.3 F (36.8 C)  TempSrc: Oral  SpO2: 95%  Height: 5' 5.5" (1.664 m)    General: Well developed, well nourished, in no acute distress  Skin : Warm and dry. Extensive cellulitis noted over right breast- suspect secondary skin infection at original site of shingles infection Head: Normocephalic and atraumatic  Eyes: Sclera and conjunctiva clear; pupils round and reactive to light; extraocular movements intact  Ears: External normal; canals clear; tympanic membranes normal  Oropharynx: Pink, supple. No suspicious lesions  Neck: Supple without thyromegaly, adenopathy  Lungs: Respirations unlabored;  Abdomen: Soft; nontender; limited exam as patient is seen in a wheelchair Musculoskeletal: No deformities; no active joint inflammation  Extremities: No edema, cyanosis, clubbing  Vessels: Symmetric bilaterally  Neurologic: Alert and oriented; speech intact; face symmetrical; moves all extremities well; CNII-XII intact without focal deficit   Assessment:  1. Weakness   2. Abdominal pain, unspecified abdominal location   3. Slow transit constipation   4. Hypothyroidism, unspecified type   5. Cellulitis of other specified site     Plan:  Patient's appearance in office is concerning; abdominal x-ray shows possible rectal impaction and developing gastroparesis; am also concerned that IV antibiotics are needed to treat right breast cellulitis; patient will need to go back to the ER- family opts to have patient taken by ambulance to [facility] ER.   No follow-ups on file.  Orders Placed This Encounter  Procedures  . Urine Culture    Standing Status:   Future    Number of Occurrences:   1    Standing Expiration Date:    10/15/2019  . DG Abd 2 Views    Standing Status:   Future    Number of Occurrences:   1    Standing Expiration Date:   12/15/2019    Order Specific Question:   Reason for Exam (SYMPTOM  OR DIAGNOSIS REQUIRED)    Answer:   abdominal pain    Order Specific Question:   Preferred imaging location?    Answer:   [facility]    Order Specific Question:   Radiology Contrast Protocol - do NOT remove file path    Answer:   [email or web address]  . Urinalysis    Standing Status:   Future    Number of Occurrences:   1    Standing Expiration Date:   10/15/2019  . TSH    Standing Status:   Future    Number of Occurrences:   1    Standing Expiration Date:   10/15/2019  . CBC w/Diff    Standing Status:   Future    Number of Occurrences:   1    Standing Expiration Date:   10/15/2019  . Comp Met (CMET)    Standing Status:   Future    Number of Occurrences:   1    Standing Expiration Date:   10/15/2019    Requested Prescriptions    No prescriptions requested or ordered in this encounter

## 2018-10-15 NOTE — ED Notes (Signed)
ptar called for pt 

## 2018-10-15 NOTE — ED Notes (Signed)
[phone] Granddaughter [name], POA wants to speak with RN

## 2018-10-15 NOTE — Progress Notes (Signed)
Patient sent to the hospital for probable admission.

## 2018-10-16 DIAGNOSIS — K5900 Constipation, unspecified: Secondary | ICD-10-CM | POA: Diagnosis not present

## 2018-10-16 DIAGNOSIS — R52 Pain, unspecified: Secondary | ICD-10-CM | POA: Diagnosis not present

## 2018-10-16 DIAGNOSIS — R1084 Generalized abdominal pain: Secondary | ICD-10-CM | POA: Diagnosis not present

## 2018-10-16 DIAGNOSIS — R531 Weakness: Secondary | ICD-10-CM | POA: Diagnosis not present

## 2018-10-16 DIAGNOSIS — R0689 Other abnormalities of breathing: Secondary | ICD-10-CM | POA: Diagnosis not present

## 2018-10-20 DIAGNOSIS — M6281 Muscle weakness (generalized): Secondary | ICD-10-CM | POA: Diagnosis not present

## 2018-10-20 DIAGNOSIS — F419 Anxiety disorder, unspecified: Secondary | ICD-10-CM | POA: Diagnosis not present

## 2018-10-20 DIAGNOSIS — I2510 Atherosclerotic heart disease of native coronary artery without angina pectoris: Secondary | ICD-10-CM | POA: Diagnosis not present

## 2018-10-20 DIAGNOSIS — I4891 Unspecified atrial fibrillation: Secondary | ICD-10-CM | POA: Diagnosis not present

## 2018-10-20 DIAGNOSIS — D649 Anemia, unspecified: Secondary | ICD-10-CM | POA: Diagnosis not present

## 2018-10-20 DIAGNOSIS — K219 Gastro-esophageal reflux disease without esophagitis: Secondary | ICD-10-CM | POA: Diagnosis not present

## 2018-10-20 DIAGNOSIS — I10 Essential (primary) hypertension: Secondary | ICD-10-CM | POA: Diagnosis not present

## 2018-10-20 DIAGNOSIS — E785 Hyperlipidemia, unspecified: Secondary | ICD-10-CM | POA: Diagnosis not present

## 2018-10-20 DIAGNOSIS — I739 Peripheral vascular disease, unspecified: Secondary | ICD-10-CM | POA: Diagnosis not present

## 2018-10-21 DIAGNOSIS — R2689 Other abnormalities of gait and mobility: Secondary | ICD-10-CM | POA: Diagnosis not present

## 2018-10-21 DIAGNOSIS — B009 Herpesviral infection, unspecified: Secondary | ICD-10-CM | POA: Diagnosis not present

## 2018-10-21 DIAGNOSIS — R4182 Altered mental status, unspecified: Secondary | ICD-10-CM | POA: Diagnosis not present

## 2018-10-21 DIAGNOSIS — I2510 Atherosclerotic heart disease of native coronary artery without angina pectoris: Secondary | ICD-10-CM | POA: Diagnosis not present

## 2018-10-21 DIAGNOSIS — I4892 Unspecified atrial flutter: Secondary | ICD-10-CM | POA: Diagnosis not present

## 2018-10-21 DIAGNOSIS — C50912 Malignant neoplasm of unspecified site of left female breast: Secondary | ICD-10-CM | POA: Diagnosis not present

## 2018-10-21 DIAGNOSIS — R2681 Unsteadiness on feet: Secondary | ICD-10-CM | POA: Diagnosis not present

## 2018-10-21 DIAGNOSIS — B029 Zoster without complications: Secondary | ICD-10-CM | POA: Diagnosis not present

## 2018-10-21 DIAGNOSIS — M6281 Muscle weakness (generalized): Secondary | ICD-10-CM | POA: Diagnosis not present

## 2018-10-21 DIAGNOSIS — R41841 Cognitive communication deficit: Secondary | ICD-10-CM | POA: Diagnosis not present

## 2018-10-21 DIAGNOSIS — I10 Essential (primary) hypertension: Secondary | ICD-10-CM | POA: Diagnosis not present

## 2018-10-22 DIAGNOSIS — R2681 Unsteadiness on feet: Secondary | ICD-10-CM | POA: Diagnosis not present

## 2018-10-22 DIAGNOSIS — B029 Zoster without complications: Secondary | ICD-10-CM | POA: Diagnosis not present

## 2018-10-22 DIAGNOSIS — B009 Herpesviral infection, unspecified: Secondary | ICD-10-CM | POA: Diagnosis not present

## 2018-10-22 DIAGNOSIS — R41841 Cognitive communication deficit: Secondary | ICD-10-CM | POA: Diagnosis not present

## 2018-10-22 DIAGNOSIS — M6281 Muscle weakness (generalized): Secondary | ICD-10-CM | POA: Diagnosis not present

## 2018-10-22 DIAGNOSIS — R4182 Altered mental status, unspecified: Secondary | ICD-10-CM | POA: Diagnosis not present

## 2018-10-25 DIAGNOSIS — B029 Zoster without complications: Secondary | ICD-10-CM | POA: Diagnosis not present

## 2018-10-25 DIAGNOSIS — R2681 Unsteadiness on feet: Secondary | ICD-10-CM | POA: Diagnosis not present

## 2018-10-25 DIAGNOSIS — R41841 Cognitive communication deficit: Secondary | ICD-10-CM | POA: Diagnosis not present

## 2018-10-25 DIAGNOSIS — M6281 Muscle weakness (generalized): Secondary | ICD-10-CM | POA: Diagnosis not present

## 2018-10-25 DIAGNOSIS — R4182 Altered mental status, unspecified: Secondary | ICD-10-CM | POA: Diagnosis not present

## 2018-10-25 DIAGNOSIS — B009 Herpesviral infection, unspecified: Secondary | ICD-10-CM | POA: Diagnosis not present

## 2018-10-26 DIAGNOSIS — B029 Zoster without complications: Secondary | ICD-10-CM | POA: Diagnosis not present

## 2018-10-26 DIAGNOSIS — R2681 Unsteadiness on feet: Secondary | ICD-10-CM | POA: Diagnosis not present

## 2018-10-26 DIAGNOSIS — R41841 Cognitive communication deficit: Secondary | ICD-10-CM | POA: Diagnosis not present

## 2018-10-26 DIAGNOSIS — M6281 Muscle weakness (generalized): Secondary | ICD-10-CM | POA: Diagnosis not present

## 2018-10-26 DIAGNOSIS — R4182 Altered mental status, unspecified: Secondary | ICD-10-CM | POA: Diagnosis not present

## 2018-10-26 DIAGNOSIS — B009 Herpesviral infection, unspecified: Secondary | ICD-10-CM | POA: Diagnosis not present

## 2018-11-03 ENCOUNTER — Telehealth: Payer: Self-pay

## 2018-11-03 DIAGNOSIS — R413 Other amnesia: Secondary | ICD-10-CM

## 2018-11-03 NOTE — Telephone Encounter (Signed)
Please advise about referral 

## 2018-11-03 NOTE — Telephone Encounter (Signed)
Copied from [facility] [id number]. Topic: General - Other >> Nov 03, 2018 10:27 AM Rainey Pines A wrote: [facility] nurse wants to speak with Dr. Alain Marion nurse about patient getting a neurology referral due to some some forgetfull ness and agitation. Patients family is also wanting this for the patient. Also asked that Dr. Alain Marion review recent hospital stay notes. [phone]

## 2018-11-04 NOTE — Telephone Encounter (Signed)
I spoke w/pt last Sat re her constipation. She said she was better. OK Neurol ref Thx AP

## 2018-11-04 NOTE — Addendum Note (Signed)
Addended by: [name] on: 11/04/2018 07:30 AM   Modules accepted: Orders

## 2018-11-05 ENCOUNTER — Encounter: Payer: Self-pay | Admitting: Neurology

## 2018-11-15 ENCOUNTER — Ambulatory Visit (INDEPENDENT_AMBULATORY_CARE_PROVIDER_SITE_OTHER): Payer: Medicare Other | Admitting: Internal Medicine

## 2018-11-15 ENCOUNTER — Encounter: Payer: Self-pay | Admitting: Internal Medicine

## 2018-11-15 ENCOUNTER — Other Ambulatory Visit: Payer: Self-pay | Admitting: Internal Medicine

## 2018-11-15 ENCOUNTER — Other Ambulatory Visit (INDEPENDENT_AMBULATORY_CARE_PROVIDER_SITE_OTHER): Payer: Medicare Other

## 2018-11-15 ENCOUNTER — Other Ambulatory Visit: Payer: Self-pay

## 2018-11-15 VITALS — BP 126/68 | HR 62 | Temp 97.6°F | Ht 65.5 in | Wt 168.0 lb

## 2018-11-15 DIAGNOSIS — Z23 Encounter for immunization: Secondary | ICD-10-CM

## 2018-11-15 DIAGNOSIS — M818 Other osteoporosis without current pathological fracture: Secondary | ICD-10-CM

## 2018-11-15 DIAGNOSIS — M545 Low back pain, unspecified: Secondary | ICD-10-CM

## 2018-11-15 DIAGNOSIS — I10 Essential (primary) hypertension: Secondary | ICD-10-CM

## 2018-11-15 DIAGNOSIS — B028 Zoster with other complications: Secondary | ICD-10-CM

## 2018-11-15 DIAGNOSIS — R413 Other amnesia: Secondary | ICD-10-CM | POA: Diagnosis not present

## 2018-11-15 DIAGNOSIS — K5901 Slow transit constipation: Secondary | ICD-10-CM

## 2018-11-15 DIAGNOSIS — D508 Other iron deficiency anemias: Secondary | ICD-10-CM | POA: Diagnosis not present

## 2018-11-15 LAB — BASIC METABOLIC PANEL
BUN: 15 mg/dL (ref 6–23)
CO2: 28 mEq/L (ref 19–32)
Calcium: 9.8 mg/dL (ref 8.4–10.5)
Chloride: 99 mEq/L (ref 96–112)
Creatinine, Ser: 1.03 mg/dL (ref 0.40–1.20)
GFR: 50.22 mL/min — ABNORMAL LOW (ref >60.00)
Glucose, Bld: 132 mg/dL — ABNORMAL HIGH (ref 70–99)
Potassium: 3.4 mEq/L — ABNORMAL LOW (ref 3.5–5.1)
Sodium: 136 mEq/L (ref 135–145)

## 2018-11-15 MED ORDER — LINACLOTIDE 290 MCG PO CAPS: 290.0000 ug | ORAL_CAPSULE | Freq: Every day | ORAL | 3 refills | Status: DC

## 2018-11-15 MED ORDER — DENOSUMAB 60 MG/ML ~~LOC~~ SOSY
60.0000 mg | PREFILLED_SYRINGE | Freq: Once | SUBCUTANEOUS | Status: AC
  Administered 2018-11-15: 60 mg via SUBCUTANEOUS

## 2018-11-15 NOTE — Telephone Encounter (Signed)
Medication Refill - Medication:  temazepam (RESTORIL) 30 MG capsule [id number]   Has the patient contacted their pharmacy? No. (Agent: If no, request that the patient contact the pharmacy for the refill.) (Agent: If yes, when and what did the pharmacy advise?)  Preferred Pharmacy (with phone number or street name):  [facility] - [place], [place] - [address] [phone] (Phone)  They are needing it by tomorrow    Agent: Please be advised that RX refills may take up to 3 business days. We ask that you follow-up with your pharmacy.

## 2018-11-15 NOTE — Assessment & Plan Note (Signed)
CBC, iron 

## 2018-11-15 NOTE — Progress Notes (Signed)
Subjective:  Patient ID: [name], female    DOB: 10-17-1925  Age: 85 y.o. MRN: [id number]  CC: No chief complaint on file.   HPI RUPA SCHIMMER presents for constipation, breast cancer, dyslipidemia f/u  Outpatient Medications Prior to Visit  Medication Sig Dispense Refill   acetaminophen (TYLENOL) 500 MG tablet Take 500 mg by mouth daily as needed for mild pain.      anastrozole (ARIMIDEX) 1 MG tablet Take 1 tablet (1 mg total) by mouth daily. (Patient taking differently: Take 1 mg by mouth at bedtime. ) 90 tablet 3   apixaban (ELIQUIS) 5 MG TABS tablet Take 1 tablet (5 mg total) by mouth 2 (two) times daily. 180 tablet 3   atorvastatin (LIPITOR) 10 MG tablet Take 1 tablet (10 mg total) by mouth every evening. 90 tablet 1   B Complex-C (B-COMPLEX WITH VITAMIN C) tablet Take 1 tablet by mouth daily.     busPIRone (BUSPAR) 7.5 MG tablet Take 1 tablet (7.5 mg total) by mouth 3 (three) times daily. 270 tablet 1   Cholecalciferol (VITAMIN D3) 1000 UNITS tablet Take 1,000 Units by mouth daily.       diltiazem (CARDIZEM CD) 120 MG 24 hr capsule TAKE 1 CAPSULE (120 MG TOTAL) BY MOUTH DAILY. (Patient taking differently: Take 120 mg by mouth daily. ) 90 capsule 1   furosemide (LASIX) 40 MG tablet Take 40 mg by mouth daily as needed for fluid or edema.      levothyroxine (SYNTHROID) 25 MCG tablet TAKE 1 TABLET BY MOUTH DAILY FOR THYROID. (Patient taking differently: Take 25 mcg by mouth daily before breakfast. ) 30 tablet 11   linaclotide (LINZESS) 290 MCG CAPS capsule Take 1 capsule (290 mcg total) by mouth daily. 90 capsule 3   metoprolol succinate (TOPROL-XL) 50 MG 24 hr tablet Take 1 tablet (50 mg total) by mouth 2 (two) times daily. Take with or immediately following a meal. 180 tablet 1   Multiple Vitamins-Minerals (MULTIVITAMIN WITH MINERALS) tablet Take 1 tablet by mouth daily.     oxybutynin (DITROPAN) 5 MG tablet Take 1 tablet (5 mg total) by mouth 2 (two) times daily.  180 tablet 1   polyethylene glycol (MIRALAX) 17 g packet Take 17 g by mouth daily. 30 each 0   polyethylene glycol powder (GLYCOLAX/MIRALAX) powder Take 17-34 g by mouth 2 (two) times daily as needed for moderate constipation. 850 g 5   potassium chloride SA (K-DUR) 20 MEQ tablet Take 1 tablet by mouth only when you have to use a lasix 90 tablet 3   predniSONE (DELTASONE) 20 MG tablet Take 1 tablet (20 mg total) by mouth daily with breakfast. 5 tablet 0   temazepam (RESTORIL) 30 MG capsule Take 1 capsule (30 mg total) by mouth at bedtime. 30 capsule 5   vitamin C (ASCORBIC ACID) 500 MG tablet Take 500 mg by mouth daily.     zinc sulfate (ZINC-220) 220 (50 Zn) MG capsule Take 220 mg by mouth daily.     No facility-administered medications prior to visit.     ROS: Review of Systems  Constitutional: Negative for activity change, appetite change, chills, fatigue and unexpected weight change.  HENT: Negative for congestion, mouth sores and sinus pressure.   Eyes: Negative for visual disturbance.  Respiratory: Negative for cough and chest tightness.   Gastrointestinal: Positive for constipation. Negative for abdominal pain and nausea.  Genitourinary: Negative for difficulty urinating, frequency and vaginal pain.  Musculoskeletal: Positive for  arthralgias, back pain and gait problem.  Skin: Negative for pallor and rash.  Neurological: Negative for dizziness, tremors, weakness, numbness and headaches.  Psychiatric/Behavioral: Negative for confusion, sleep disturbance and suicidal ideas. The patient is nervous/anxious.     Objective:  BP 126/68 (BP Location: Left Arm, Patient Position: Sitting, Cuff Size: Normal)    Pulse 62    Temp 97.6 F (36.4 C) (Oral)    Ht 5' 5.5" (1.664 m)    Wt 168 lb (76.2 kg)    SpO2 98%    BMI 27.53 kg/m   BP Readings from Last 3 Encounters:  11/15/18 126/68  10/16/18 (!) 152/101  10/15/18 126/74    Wt Readings from Last 3 Encounters:  11/15/18 168 lb  (76.2 kg)  10/11/18 163 lb 12.8 oz (74.3 kg)  09/08/18 172 lb 14.4 oz (78.4 kg)    Physical Exam Constitutional:      General: She is not in acute distress.    Appearance: She is well-developed.  HENT:     Head: Normocephalic.     Right Ear: External ear normal.     Left Ear: External ear normal.     Nose: Nose normal.  Eyes:     General:        Right eye: No discharge.        Left eye: No discharge.     Conjunctiva/sclera: Conjunctivae normal.     Pupils: Pupils are equal, round, and reactive to light.  Neck:     Musculoskeletal: Normal range of motion and neck supple.     Thyroid: No thyromegaly.     Vascular: No JVD.     Trachea: No tracheal deviation.  Cardiovascular:     Rate and Rhythm: Normal rate. Rhythm irregular.     Heart sounds: Normal heart sounds.  Pulmonary:     Effort: No respiratory distress.     Breath sounds: No stridor. No wheezing.  Abdominal:     General: Bowel sounds are normal. There is no distension.     Palpations: Abdomen is soft. There is no mass.     Tenderness: There is no abdominal tenderness. There is no guarding or rebound.  Musculoskeletal:        General: Tenderness present.  Lymphadenopathy:     Cervical: No cervical adenopathy.  Skin:    Findings: No erythema or rash.  Neurological:     Cranial Nerves: No cranial nerve deficit.     Motor: No abnormal muscle tone.     Coordination: Coordination normal.     Gait: Gait abnormal.     Deep Tendon Reflexes: Reflexes normal.  Psychiatric:        Behavior: Behavior normal.        Thought Content: Thought content normal.        Judgment: Judgment normal.   walker  Lab Results  Component Value Date   WBC 6.8 10/15/2018   HGB 14.2 10/15/2018   HCT 40.4 10/15/2018   PLT 278 10/15/2018   GLUCOSE 129 (H) 10/15/2018   CHOL 116 06/25/2012   TRIG 82 09/05/2013   HDL 41.50 06/25/2012   LDLDIRECT 110.3 05/24/2008   LDLCALC 66 06/25/2012   ALT 39 10/15/2018   AST 27 10/15/2018   NA  135 10/15/2018   K 3.5 10/15/2018   CL 95 (L) 10/15/2018   CREATININE 1.49 (H) 10/15/2018   BUN 16 10/15/2018   CO2 29 10/15/2018   TSH 0.99 10/15/2018   INR 1.33 05/23/2017  HGBA1C 5.9 (H) 10/07/2018    Ct Abdomen Pelvis W Contrast  Result Date: 10/15/2018 CLINICAL DATA:  Evaluate for small bowel obstruction EXAM: CT ABDOMEN AND PELVIS WITH CONTRAST TECHNIQUE: Multidetector CT imaging of the abdomen and pelvis was performed using the standard protocol following bolus administration of intravenous contrast. CONTRAST:  69mL OMNIPAQUE IOHEXOL 300 MG/ML  SOLN COMPARISON:  Same-day radiograph, CT renal colic May 25, [id number] FINDINGS: Lower chest: Bandlike areas of bibasilar atelectasis and/or scarring. Atherosclerotic calcification of the coronary arteries. Mild biatrial enlargement. No pericardial effusion. Hepatobiliary: No focal liver abnormality is seen. Gradient density within the gallbladder with few dependently layering gallstones noted in the fundus. No biliary ductal dilatation or calcified intraductal gallstones. Pancreas: Pancreatic atrophy, similar to priors. No focal abnormality or ductal dilatation. Spleen: Normal in size without focal abnormality. Adrenals/Urinary Tract: No concerning adrenal lesions. Mild bilateral cortical scarring of both kidneys. 1.5 cm fluid attenuation cysts seen in the upper pole left kidney. Slight left extrarenal pelvis is noted. Kidneys are otherwise unremarkable, without renal calculi, suspicious lesion, or hydronephrosis. Bladder is unremarkable. Stomach/Bowel: Distal esophagus, stomach and duodenal sweep are unremarkable. No bowel wall thickening or dilatation. No evidence of obstruction. Small noninflamed appendiceal stump noted at the tip of the cecum. Fecal material is present throughout the colon. Focal area of circumferential thickening is present in the proximal sigmoid, possible underdistention versus peristaltic contraction. Stool ball noted in the rectum  with mild mucosal hyperenhancement and slight perirectal stranding. Scattered colonic diverticula without focal pericolonic inflammation to suggest diverticulitis. Vascular/Lymphatic: Atherosclerotic plaque within the normal caliber aorta. No suspicious or enlarged lymph nodes in the included lymphatic chains. Reproductive: Normal anteverted uterus. No concerning adnexal lesions. Other: No abdominopelvic free fluid or free gas. No bowel containing hernias. Musculoskeletal: Bones are diffusely demineralized. Postsurgical changes from prior total right hip arthroplasty which is in expected positioning. There is resultant streak artifact. Degenerative changes are present in the lower lumbar spine, both SI joints and the left hip. There are postsurgical changes from prior L3-4 and L4-5 interbody spacer placement with fusion across disc levels. There is an age-indeterminate compression deformity of T12 with approximately 50% height loss anteriorly which is new from comparison from Jun 13, 2016. Sclerosis across the disc may reflect a subacute nature of this compression deformity. IMPRESSION: 1. No evidence of bowel obstruction. 2. Moderate volume fecal material throughout colon including stool ball in the rectum mild perirectal stranding, could reflect impaction or stercoral colitis. 3. Age-indeterminate compression deformity of T12 with approximately 50% height loss anteriorly, new from comparison from Jun 13, 2016. Sclerosis across the disc may reflect a subacute nature of this compression deformity. Correlate with point tenderness. 4. Cholelithiasis and biliary sludge in without ductal dilatation or evidence of acute cholecystitis 5. Mild circumferential thickening of the proximal sigmoid without adjacent inflammatory change. Could reflect focal peristalsis or underdistention but should be correlated with most recent colonoscopy. 6. Pancreatic atrophy. 7. Aortic Atherosclerosis (ICD10-I70.0). These results were called  by telephone at the time of interpretation on 10/15/2018 at 8:52 pm to provider [facility] , who verbally acknowledged these results. Electronically Signed   By: Lovena Le M.D.   On: 10/15/2018 20:52   Dg Abd 2 Views  Result Date: 10/15/2018 CLINICAL DATA:  Abdominal pain, bloating and gas for 4 days. EXAM: ABDOMEN - 2 VIEW COMPARISON:  Radiographs 06/18/2016 the lung bases are clear. FINDINGS: Mild distention of the stomach with a air-fluid level. Scattered air-filled small bowel loops but no  findings to suggest a small bowel obstruction. There is moderate stool and air throughout the colon and down into the rectum which may suggest constipation and fecal impaction. The soft tissue shadows of the abdomen are maintained. No worrisome calcifications. Stable interbody lumbar fusion devices and right hip prosthesis. IMPRESSION: 1. Moderate distention of the stomach with an air-fluid level could suggest gastro paresis. 2. Moderate air in stool throughout the colon and down into the rectum which may suggest constipation and fecal impaction. 3. No findings for small bowel obstruction or free air. Electronically Signed   By: Marijo Sanes M.D.   On: 10/15/2018 14:48    Assessment & Plan:   There are no diagnoses linked to this encounter.   No orders of the defined types were placed in this encounter.    Follow-up: No follow-ups on file.  Walker Kehr, MD

## 2018-11-15 NOTE — Assessment & Plan Note (Signed)
Linzess Miralax

## 2018-11-15 NOTE — Assessment & Plan Note (Signed)
Walker Tylenol

## 2018-11-15 NOTE — Assessment & Plan Note (Signed)
Metoprolol, Diltiazem

## 2018-11-15 NOTE — Assessment & Plan Note (Signed)
confusion - poss dementia

## 2018-11-15 NOTE — Assessment & Plan Note (Signed)
resolved 

## 2018-11-16 NOTE — Telephone Encounter (Signed)
Pharmacy calling again to make sure that patient receives her refill by tomorrow because her daughter is coming by to pick it up and patient is all out.  CB# [phone]

## 2018-11-16 NOTE — Telephone Encounter (Signed)
[facility] called again about this refill.  Pt is coming today to get this.  Please advise

## 2018-11-16 NOTE — Telephone Encounter (Signed)
[facility] Controlled Database Checked Last filled: 05/20/18 # 30 LOV w/you: 11/15/18 Next appt w/you: 02/15/19

## 2018-11-17 MED ORDER — TEMAZEPAM 30 MG PO CAPS: 30.0000 mg | ORAL_CAPSULE | Freq: Every day | ORAL | 3 refills | Status: DC

## 2018-11-17 NOTE — Telephone Encounter (Signed)
Printed Rx, signed a faxed to pharmacy on file.

## 2018-11-17 NOTE — Telephone Encounter (Signed)
Pharm is still waiting on temazepam. Pt needs medication today. Please ask for kevie if any problem

## 2018-11-17 NOTE — Telephone Encounter (Signed)
Routing to CMA 

## 2018-11-20 ENCOUNTER — Encounter

## 2018-11-23 ENCOUNTER — Other Ambulatory Visit: Payer: Self-pay | Admitting: Internal Medicine

## 2018-11-23 MED ORDER — METOPROLOL SUCCINATE ER 50 MG PO TB24: 50.0000 mg | ORAL_TABLET | Freq: Two times a day (BID) | ORAL | 1 refills | Status: DC

## 2018-11-23 NOTE — Telephone Encounter (Signed)
Medication Refill - Medication:  metoprolol succinate (TOPROL-XL) 50 MG 24 hr tablet  Has the patient contacted their pharmacy?  Yes pharmacy called stating they had sent a request for this in with no response.   Preferred Pharmacy (with phone number or street name):  [facility] [place], [place] - [address] [phone] (Phone) [phone] (Fax)     Agent: Please be advised that RX refills may take up to 3 business days. We ask that you follow-up with your pharmacy.

## 2018-11-30 ENCOUNTER — Other Ambulatory Visit: Payer: Self-pay | Admitting: Internal Medicine

## 2018-11-30 MED ORDER — BUSPIRONE HCL 7.5 MG PO TABS: 7.5000 mg | ORAL_TABLET | Freq: Three times a day (TID) | ORAL | 1 refills | Status: DC

## 2018-11-30 NOTE — Telephone Encounter (Signed)
Medication Refill - Medication: busPIRone (BUSPAR) 7.5 MG tablet  Has the patient contacted their pharmacy? No. (Agent: If no, request that the patient contact the pharmacy for the refill.) (Agent: If yes, when and what did the pharmacy advise?)  Preferred Pharmacy (with phone number or street name): [facility] [place], [place] - [address] [phone] (Phone) [phone] (Fax)     Agent: Please be advised that RX refills may take up to 3 business days. We ask that you follow-up with your pharmacy.

## 2018-12-13 ENCOUNTER — Other Ambulatory Visit: Payer: Self-pay | Admitting: Internal Medicine

## 2018-12-13 NOTE — Telephone Encounter (Signed)
Medication Refill - Medication: apixaban (ELIQUIS) 5 MG TABS tablet   Has the patient contacted their pharmacy? No. (Agent: If no, request that the patient contact the pharmacy for the refill.) (Agent: If yes, when and what did the pharmacy advise?)  Preferred Pharmacy (with phone number or street name):  [facility] [place], [place] - [address] [phone] (Phone) [phone] (Fax)     Agent: Please be advised that RX refills may take up to 3 business days. We ask that you follow-up with your pharmacy.

## 2018-12-13 NOTE — Telephone Encounter (Signed)
OK to refill. cdm 

## 2018-12-14 MED ORDER — APIXABAN 5 MG PO TABS: 5.0000 mg | ORAL_TABLET | Freq: Two times a day (BID) | ORAL | 1 refills | Status: DC

## 2018-12-14 NOTE — Telephone Encounter (Signed)
Age 85, weight 76kg, SCr 1.03 on 11/15/18. Last OV July 2020, afib indication.

## 2018-12-20 ENCOUNTER — Other Ambulatory Visit: Payer: Self-pay | Admitting: Internal Medicine

## 2018-12-20 MED ORDER — ATORVASTATIN CALCIUM 10 MG PO TABS: 10.0000 mg | ORAL_TABLET | Freq: Every evening | ORAL | 1 refills | Status: DC

## 2018-12-20 NOTE — Telephone Encounter (Signed)
Medication Refill - Medication: atorvastatin  Has the patient contacted their pharmacy? Yes.   Pt only has one more pill left. Please advise.  (Agent: If no, request that the patient contact the pharmacy for the refill.) (Agent: If yes, when and what did the pharmacy advise?)  Preferred Pharmacy (with phone number or street name):  [facility] - [place], [place] - [address]  [address] [place] [address]  Phone: [phone] Fax: [phone]  Not a 24 hour pharmacy; exact hours not known.     Agent: Please be advised that RX refills may take up to 3 business days. We ask that you follow-up with your pharmacy.

## 2018-12-22 DIAGNOSIS — L738 Other specified follicular disorders: Secondary | ICD-10-CM | POA: Diagnosis not present

## 2018-12-22 DIAGNOSIS — L304 Erythema intertrigo: Secondary | ICD-10-CM | POA: Diagnosis not present

## 2018-12-22 DIAGNOSIS — Z85828 Personal history of other malignant neoplasm of skin: Secondary | ICD-10-CM | POA: Diagnosis not present

## 2019-01-06 ENCOUNTER — Other Ambulatory Visit: Payer: Self-pay | Admitting: Internal Medicine

## 2019-01-06 MED ORDER — LINACLOTIDE 290 MCG PO CAPS: 290.0000 ug | ORAL_CAPSULE | Freq: Every day | ORAL | 3 refills | Status: DC

## 2019-01-06 NOTE — Telephone Encounter (Signed)
Medication Refill - Medication: linaclotide (LINZESS) 290 MCG CAPS capsule Pt is out of medication. It was requested on Monday and was sent under the wrong pt.  Has the patient contacted their pharmacy? Yes.   (Agent: If no, request that the patient contact the pharmacy for the refill.) (Agent: If yes, when and what did the pharmacy advise?)  Preferred Pharmacy (with phone number or street name):  [facility] [place], [place] - [address] Phone:  [phone]  Fax:  [phone]       Agent: Please be advised that RX refills may take up to 3 business days. We ask that you follow-up with your pharmacy.

## 2019-01-10 ENCOUNTER — Encounter: Payer: Self-pay | Admitting: Neurology

## 2019-01-17 ENCOUNTER — Ambulatory Visit: Payer: Medicare Other | Admitting: Neurology

## 2019-01-18 ENCOUNTER — Other Ambulatory Visit: Payer: Self-pay | Admitting: Internal Medicine

## 2019-01-18 MED ORDER — TEMAZEPAM 15 MG PO CAPS: 15.0000 mg | ORAL_CAPSULE | Freq: Every evening | ORAL | 1 refills | Status: DC | PRN

## 2019-02-15 ENCOUNTER — Other Ambulatory Visit: Payer: Self-pay

## 2019-02-15 ENCOUNTER — Encounter: Payer: Self-pay | Admitting: Internal Medicine

## 2019-02-15 ENCOUNTER — Ambulatory Visit (INDEPENDENT_AMBULATORY_CARE_PROVIDER_SITE_OTHER): Payer: Medicare Other | Admitting: Internal Medicine

## 2019-02-15 DIAGNOSIS — I4892 Unspecified atrial flutter: Secondary | ICD-10-CM

## 2019-02-15 DIAGNOSIS — M818 Other osteoporosis without current pathological fracture: Secondary | ICD-10-CM

## 2019-02-15 DIAGNOSIS — R413 Other amnesia: Secondary | ICD-10-CM

## 2019-02-15 DIAGNOSIS — D508 Other iron deficiency anemias: Secondary | ICD-10-CM

## 2019-02-15 DIAGNOSIS — I10 Essential (primary) hypertension: Secondary | ICD-10-CM

## 2019-02-15 LAB — CBC WITH DIFFERENTIAL/PLATELET
Basophils Absolute: 0.1 10*3/uL (ref 0.0–0.1)
Basophils Relative: 1.3 % (ref 0.0–3.0)
Eosinophils Absolute: 0.1 10*3/uL (ref 0.0–0.7)
Eosinophils Relative: 2.1 % (ref 0.0–5.0)
HCT: 37.7 % (ref 36.0–46.0)
Hemoglobin: 12.9 g/dL (ref 12.0–15.0)
Lymphocytes Relative: 34 % (ref 12.0–46.0)
Lymphs Abs: 1.6 10*3/uL (ref 0.7–4.0)
MCHC: 34.1 g/dL (ref 30.0–36.0)
MCV: 108.4 fl — ABNORMAL HIGH (ref 78.0–100.0)
Monocytes Absolute: 0.6 10*3/uL (ref 0.1–1.0)
Monocytes Relative: 12.5 % — ABNORMAL HIGH (ref 3.0–12.0)
Neutro Abs: 2.3 10*3/uL (ref 1.4–7.7)
Neutrophils Relative %: 50.1 % (ref 43.0–77.0)
Platelets: 211 10*3/uL (ref 150.0–400.0)
RBC: 3.48 Mil/uL — ABNORMAL LOW (ref 3.87–5.11)
RDW: 12.6 % (ref 11.5–15.5)
WBC: 4.6 10*3/uL (ref 4.0–10.5)

## 2019-02-15 LAB — BASIC METABOLIC PANEL
BUN: 24 mg/dL — ABNORMAL HIGH (ref 6–23)
CO2: 29 mEq/L (ref 19–32)
Calcium: 10.1 mg/dL (ref 8.4–10.5)
Chloride: 96 mEq/L (ref 96–112)
Creatinine, Ser: 1.04 mg/dL (ref 0.40–1.20)
GFR: 49.64 mL/min — ABNORMAL LOW (ref >60.00)
Glucose, Bld: 114 mg/dL — ABNORMAL HIGH (ref 70–99)
Potassium: 4 mEq/L (ref 3.5–5.1)
Sodium: 133 mEq/L — ABNORMAL LOW (ref 135–145)

## 2019-02-15 LAB — HEPATIC FUNCTION PANEL
ALT: 13 U/L (ref 0–35)
AST: 23 U/L (ref 0–37)
Albumin: 4.1 g/dL (ref 3.5–5.2)
Alkaline Phosphatase: 55 U/L (ref 39–117)
Bilirubin, Direct: 0.2 mg/dL (ref 0.0–0.3)
Total Bilirubin: 0.9 mg/dL (ref 0.2–1.2)
Total Protein: 7.4 g/dL (ref 6.0–8.3)

## 2019-02-15 MED ORDER — TEMAZEPAM 15 MG PO CAPS: 15.0000 mg | ORAL_CAPSULE | Freq: Every evening | ORAL | 1 refills | Status: DC | PRN

## 2019-02-15 MED ORDER — DILTIAZEM HCL ER COATED BEADS 120 MG PO CP24: ORAL_CAPSULE | ORAL | 3 refills | Status: DC

## 2019-02-15 MED ORDER — METOPROLOL SUCCINATE ER 50 MG PO TB24: 50.0000 mg | ORAL_TABLET | Freq: Two times a day (BID) | ORAL | 3 refills | Status: DC

## 2019-02-15 MED ORDER — DOXYCYCLINE HYCLATE 100 MG PO TABS: 100.0000 mg | ORAL_TABLET | Freq: Two times a day (BID) | ORAL | 3 refills | Status: DC

## 2019-02-15 NOTE — Assessment & Plan Note (Signed)
On Eliquis

## 2019-02-15 NOTE — Assessment & Plan Note (Signed)
Worse 

## 2019-02-15 NOTE — Assessment & Plan Note (Signed)
Vit D Prolia 

## 2019-02-15 NOTE — Assessment & Plan Note (Signed)
Metoprolol, Diltiazem

## 2019-02-15 NOTE — Progress Notes (Signed)
Subjective:  Patient ID: [name], female    DOB: 09/13/1925  Age: 86 y.o. MRN: [id number]  CC: No chief complaint on file.   HPI [name] presents for shingles - hosp ER visit in Sept ?zoster, f/u hardware infection, insomnia  Outpatient Medications Prior to Visit  Medication Sig Dispense Refill  . acetaminophen (TYLENOL) 500 MG tablet Take 500 mg by mouth daily as needed for mild pain.     [name] anastrozole (ARIMIDEX) 1 MG tablet Take 1 tablet (1 mg total) by mouth daily. (Patient taking differently: Take 1 mg by mouth at bedtime. ) 90 tablet 3  . apixaban (ELIQUIS) 5 MG TABS tablet Take 1 tablet (5 mg total) by mouth 2 (two) times daily. 180 tablet 1  . atorvastatin (LIPITOR) 10 MG tablet Take 1 tablet (10 mg total) by mouth every evening. 90 tablet 1  . B Complex-C (B-COMPLEX WITH VITAMIN C) tablet Take 1 tablet by mouth daily.    . busPIRone (BUSPAR) 7.5 MG tablet Take 1 tablet (7.5 mg total) by mouth 3 (three) times daily. 270 tablet 1  . Cholecalciferol (VITAMIN D3) 1000 UNITS tablet Take 1,000 Units by mouth daily.      [name] diltiazem (CARDIZEM CD) 120 MG 24 hr capsule TAKE 1 CAPSULE (120 MG TOTAL) BY MOUTH DAILY. (Patient taking differently: Take 120 mg by mouth daily. ) 90 capsule 1  . furosemide (LASIX) 40 MG tablet Take 40 mg by mouth daily as needed for fluid or edema.     [name] levothyroxine (SYNTHROID) 25 MCG tablet TAKE 1 TABLET BY MOUTH DAILY FOR THYROID. (Patient taking differently: Take 25 mcg by mouth daily before breakfast. ) 30 tablet 11  . linaclotide (LINZESS) 290 MCG CAPS capsule Take 1 capsule (290 mcg total) by mouth daily. 90 capsule 3  . metoprolol succinate (TOPROL-XL) 50 MG 24 hr tablet Take 1 tablet (50 mg total) by mouth 2 (two) times daily. Take with or immediately following a meal. 180 tablet 1  . Multiple Vitamins-Minerals (MULTIVITAMIN WITH MINERALS) tablet Take 1 tablet by mouth daily.    . polyethylene glycol (MIRALAX) 17 g packet Take 17 g by mouth  daily. 30 each 0  . polyethylene glycol powder (GLYCOLAX/MIRALAX) powder Take 17-34 g by mouth 2 (two) times daily as needed for moderate constipation. 850 g 5  . potassium chloride SA (K-DUR) 20 MEQ tablet Take 1 tablet by mouth only when you have to use a lasix 90 tablet 3  . predniSONE (DELTASONE) 20 MG tablet Take 1 tablet (20 mg total) by mouth daily with breakfast. 5 tablet 0  . temazepam (RESTORIL) 15 MG capsule Take 1 capsule (15 mg total) by mouth at bedtime as needed for sleep. 30 capsule 1  . vitamin C (ASCORBIC ACID) 500 MG tablet Take 500 mg by mouth daily.    [name] zinc sulfate (ZINC-220) 220 (50 Zn) MG capsule Take 220 mg by mouth daily.     No facility-administered medications prior to visit.    ROS: Review of Systems  Constitutional: Negative for activity change, appetite change, chills, fatigue and unexpected weight change.  HENT: Negative for congestion, mouth sores and sinus pressure.   Eyes: Negative for visual disturbance.  Respiratory: Negative for cough and chest tightness.   Gastrointestinal: Negative for abdominal pain and nausea.  Genitourinary: Negative for difficulty urinating, frequency and vaginal pain.  Musculoskeletal: Negative for back pain and gait problem.  Skin: Negative for pallor and rash.  Neurological: Negative  for dizziness, tremors, weakness, numbness and headaches.  Psychiatric/Behavioral: Positive for confusion, decreased concentration, dysphoric mood and sleep disturbance. Negative for suicidal ideas. The patient is nervous/anxious.     Objective:  BP 134/76 (BP Location: Left Arm, Patient Position: Sitting, Cuff Size: Normal)   Pulse (!) 43   Temp 98.1 F (36.7 C) (Oral)   Ht 5' 5.5" (1.664 m)   Wt 169 lb (76.7 kg)   SpO2 98%   BMI 27.70 kg/m   BP Readings from Last 3 Encounters:  02/15/19 134/76  11/15/18 126/68  10/16/18 (!) 152/101    Wt Readings from Last 3 Encounters:  02/15/19 169 lb (76.7 kg)  11/15/18 168 lb (76.2 kg)   10/11/18 163 lb 12.8 oz (74.3 kg)    Physical Exam Constitutional:      General: She is not in acute distress.    Appearance: Normal appearance. She is well-developed. She is obese.  HENT:     Head: Normocephalic.     Right Ear: External ear normal.     Left Ear: External ear normal.     Nose: Nose normal.  Eyes:     General:        Right eye: No discharge.        Left eye: No discharge.     Conjunctiva/sclera: Conjunctivae normal.     Pupils: Pupils are equal, round, and reactive to light.  Neck:     Thyroid: No thyromegaly.     Vascular: No JVD.     Trachea: No tracheal deviation.  Cardiovascular:     Rate and Rhythm: Normal rate and regular rhythm.     Heart sounds: Normal heart sounds.  Pulmonary:     Effort: No respiratory distress.     Breath sounds: No stridor. No wheezing.  Abdominal:     General: Bowel sounds are normal. There is no distension.     Palpations: Abdomen is soft. There is no mass.     Tenderness: There is no abdominal tenderness. There is no guarding or rebound.  Musculoskeletal:        General: No tenderness.     Cervical back: Normal range of motion and neck supple.  Lymphadenopathy:     Cervical: No cervical adenopathy.  Skin:    Findings: No erythema or rash.  Neurological:     Mental Status: She is disoriented.     Cranial Nerves: No cranial nerve deficit.     Motor: Weakness present. No abnormal muscle tone.     Coordination: Coordination normal.     Gait: Gait abnormal.     Deep Tendon Reflexes: Reflexes normal.  Psychiatric:        Behavior: Behavior normal.        Thought Content: Thought content normal.        Judgment: Judgment normal.   walker  Lab Results  Component Value Date   WBC 6.8 10/15/2018   HGB 14.2 10/15/2018   HCT 40.4 10/15/2018   PLT 278 10/15/2018   GLUCOSE 132 (H) 11/15/2018   CHOL 116 06/25/2012   TRIG 82 09/05/2013   HDL 41.50 06/25/2012   LDLDIRECT 110.3 05/24/2008   LDLCALC 66 06/25/2012   ALT 39  10/15/2018   AST 27 10/15/2018   NA 136 11/15/2018   K 3.4 (L) 11/15/2018   CL 99 11/15/2018   CREATININE 1.03 11/15/2018   BUN 15 11/15/2018   CO2 28 11/15/2018   TSH 0.99 10/15/2018   INR 1.33 05/23/2017  HGBA1C 5.9 (H) 10/07/2018    CT ABDOMEN PELVIS W CONTRAST  Result Date: 10/15/2018 CLINICAL DATA:  Evaluate for small bowel obstruction EXAM: CT ABDOMEN AND PELVIS WITH CONTRAST TECHNIQUE: Multidetector CT imaging of the abdomen and pelvis was performed using the standard protocol following bolus administration of intravenous contrast. CONTRAST:  46mL OMNIPAQUE IOHEXOL 300 MG/ML  SOLN COMPARISON:  Same-day radiograph, CT renal colic May 25, [id number] FINDINGS: Lower chest: Bandlike areas of bibasilar atelectasis and/or scarring. Atherosclerotic calcification of the coronary arteries. Mild biatrial enlargement. No pericardial effusion. Hepatobiliary: No focal liver abnormality is seen. Gradient density within the gallbladder with few dependently layering gallstones noted in the fundus. No biliary ductal dilatation or calcified intraductal gallstones. Pancreas: Pancreatic atrophy, similar to priors. No focal abnormality or ductal dilatation. Spleen: Normal in size without focal abnormality. Adrenals/Urinary Tract: No concerning adrenal lesions. Mild bilateral cortical scarring of both kidneys. 1.5 cm fluid attenuation cysts seen in the upper pole left kidney. Slight left extrarenal pelvis is noted. Kidneys are otherwise unremarkable, without renal calculi, suspicious lesion, or hydronephrosis. Bladder is unremarkable. Stomach/Bowel: Distal esophagus, stomach and duodenal sweep are unremarkable. No bowel wall thickening or dilatation. No evidence of obstruction. Small noninflamed appendiceal stump noted at the tip of the cecum. Fecal material is present throughout the colon. Focal area of circumferential thickening is present in the proximal sigmoid, possible underdistention versus peristaltic  contraction. Stool ball noted in the rectum with mild mucosal hyperenhancement and slight perirectal stranding. Scattered colonic diverticula without focal pericolonic inflammation to suggest diverticulitis. Vascular/Lymphatic: Atherosclerotic plaque within the normal caliber aorta. No suspicious or enlarged lymph nodes in the included lymphatic chains. Reproductive: Normal anteverted uterus. No concerning adnexal lesions. Other: No abdominopelvic free fluid or free gas. No bowel containing hernias. Musculoskeletal: Bones are diffusely demineralized. Postsurgical changes from prior total right hip arthroplasty which is in expected positioning. There is resultant streak artifact. Degenerative changes are present in the lower lumbar spine, both SI joints and the left hip. There are postsurgical changes from prior L3-4 and L4-5 interbody spacer placement with fusion across disc levels. There is an age-indeterminate compression deformity of T12 with approximately 50% height loss anteriorly which is new from comparison from Jun 13, 2016. Sclerosis across the disc may reflect a subacute nature of this compression deformity. IMPRESSION: 1. No evidence of bowel obstruction. 2. Moderate volume fecal material throughout colon including stool ball in the rectum mild perirectal stranding, could reflect impaction or stercoral colitis. 3. Age-indeterminate compression deformity of T12 with approximately 50% height loss anteriorly, new from comparison from Jun 13, 2016. Sclerosis across the disc may reflect a subacute nature of this compression deformity. Correlate with point tenderness. 4. Cholelithiasis and biliary sludge in without ductal dilatation or evidence of acute cholecystitis 5. Mild circumferential thickening of the proximal sigmoid without adjacent inflammatory change. Could reflect focal peristalsis or underdistention but should be correlated with most recent colonoscopy. 6. Pancreatic atrophy. 7. Aortic  Atherosclerosis (ICD10-I70.0). These results were called by telephone at the time of interpretation on 10/15/2018 at 8:52 pm to provider [facility] , who verbally acknowledged these results. Electronically Signed   By: Lovena Le M.D.   On: 10/15/2018 20:52   DG Abd 2 Views  Result Date: 10/15/2018 CLINICAL DATA:  Abdominal pain, bloating and gas for 4 days. EXAM: ABDOMEN - 2 VIEW COMPARISON:  Radiographs 06/18/2016 the lung bases are clear. FINDINGS: Mild distention of the stomach with a air-fluid level. Scattered air-filled small bowel loops but no  findings to suggest a small bowel obstruction. There is moderate stool and air throughout the colon and down into the rectum which may suggest constipation and fecal impaction. The soft tissue shadows of the abdomen are maintained. No worrisome calcifications. Stable interbody lumbar fusion devices and right hip prosthesis. IMPRESSION: 1. Moderate distention of the stomach with an air-fluid level could suggest gastro paresis. 2. Moderate air in stool throughout the colon and down into the rectum which may suggest constipation and fecal impaction. 3. No findings for small bowel obstruction or free air. Electronically Signed   By: Marijo Sanes M.D.   On: 10/15/2018 14:48    Assessment & Plan:   There are no diagnoses linked to this encounter.   No orders of the defined types were placed in this encounter.    Follow-up: No follow-ups on file.  Walker Kehr, MD

## 2019-02-15 NOTE — Assessment & Plan Note (Signed)
CBC

## 2019-02-16 LAB — IRON,TIBC AND FERRITIN PANEL
%SAT: 37 % (calc) (ref 16–45)
Ferritin: 249 ng/mL (ref 16–288)
Iron: 99 ug/dL (ref 45–160)
TIBC: 271 mcg/dL (calc) (ref 250–450)

## 2019-02-24 ENCOUNTER — Telehealth: Payer: Self-pay

## 2019-02-24 NOTE — Telephone Encounter (Signed)
Patient calling and would like a call back to discuss why Dr Alain Marion has taken her off of oxybutynin? Please advise. [place]

## 2019-02-26 NOTE — Telephone Encounter (Signed)
[name] was taken off oxybutynin due to her complaints of worsening memory loss.  Oxybutynin can affect memory in a negative way. Thanks

## 2019-02-28 NOTE — Telephone Encounter (Signed)
F/u   Patient returning call back to nurse.

## 2019-02-28 NOTE — Telephone Encounter (Signed)
Notified pt w/MD response.../lmb 

## 2019-02-28 NOTE — Telephone Encounter (Signed)
Called pt there was no answer LMOM RTC...lmb 

## 2019-03-15 ENCOUNTER — Ambulatory Visit: Payer: Medicare Other

## 2019-03-24 DIAGNOSIS — I4891 Unspecified atrial fibrillation: Secondary | ICD-10-CM | POA: Insufficient documentation

## 2019-03-25 ENCOUNTER — Ambulatory Visit: Payer: Medicare Other | Admitting: Neurology

## 2019-03-28 ENCOUNTER — Other Ambulatory Visit: Payer: Self-pay

## 2019-03-28 ENCOUNTER — Ambulatory Visit (INDEPENDENT_AMBULATORY_CARE_PROVIDER_SITE_OTHER): Payer: Medicare Other | Admitting: Neurology

## 2019-03-28 ENCOUNTER — Encounter: Payer: Self-pay | Admitting: Neurology

## 2019-03-28 VITALS — BP 136/76 | HR 66 | Ht 65.5 in | Wt 176.8 lb

## 2019-03-28 DIAGNOSIS — R413 Other amnesia: Secondary | ICD-10-CM

## 2019-03-28 DIAGNOSIS — R404 Transient alteration of awareness: Secondary | ICD-10-CM | POA: Diagnosis not present

## 2019-03-28 NOTE — Patient Instructions (Signed)
1. Schedule MRI brain without contrast  2. Schedule 1-hour EEG  3. If tests are normal, we will plan for Neurocognitive testing  4. Follow-up after tests, call for any changes

## 2019-03-28 NOTE — Progress Notes (Signed)
NEUROLOGY CONSULTATION NOTE  [name] MRN: [id number] DOB: September 20, 1925  Referring provider: Dr. Lew Dawes Primary care provider: Dr. Lew Dawes  Reason for consult:  Memory loss  Dear Dr Alain Marion:  Thank you for your kind referral of [name] for consultation of the above symptoms. Although her history is well known to you, please allow me to reiterate it for the purpose of our medical record. The patient was accompanied to the clinic by her granddaughter and POA Lattie Haw who also provides collateral information. Records and images were personally reviewed where available.   HISTORY OF PRESENT ILLNESS: This is a pleasant 86 year old right-handed woman with a history of hypertension, hyperlipidemia, atrial fibrillation, presenting for evaluation of memory loss. Her granddaughter Lattie Haw is present to provide additional information. She feels her memory is "par of the course, not all that bad." She lives with her husband of 21 years in [place]. Lattie Haw reports she was admitted to the hospital in September 2020 for shingles and was very confused. Lattie Haw was unable to visit much due to the pandemic, but had started helping since hospital discharge. Lattie Haw manages her pillbox, she takes them by herself. She had several incidents in December where she did not recognize her husband. She called Lattie Haw saying "a man I think is Lavella Hammock is saying he's my husband." Lattie Haw tried to reorient her, while Lattie Haw was talking to the patient's husband, Livia was saying she was not crazy. She returned to baseline and recalled she had called Lattie Haw earlier. A few days later, she called Lattie Haw saying her husband was missing. He was home all along, but she thought he was [place]. One of the home health aides looked through scrapbooks with her, and she started recognizing her husband. The next day, she called a family member who is a retired Chief Executive Officer, saying everyone is conspiring to make her look like a  fool. They spoke to her pharmacist, and medication adjustments were made, temazepam dose reduced to 15mg  qhs and oxybutinin stopped. She had another 5-minute confusional spell not recognizing her apartment at the end of December. In January, the police was called because her husband was missing, when in fact he was at home and she did not recognize him again. A few days later, she asked Lattie Haw how Lattie Haw was doing. She had noted that both the patient and her husband were missing medications. At the end of January, she became agitated with her husband, neighbors were alerted, and she got upset that "everyone knew her business." As Lattie Haw recounts these events, she says "it's all new to me." Lattie Haw states that she is overall good with taking her own medications. She pays her bills and writes in her checkbook, then Lattie Haw does the bank reconciliation. She stopped driving in [id number] after she had a left leg fracture from a fall. She is independent with dressing and bathing. Lattie Haw sees her once a week and states that 99% of the time, personality is the same. No staring/unresponsive episodes. No history of seizures in the past. No family history of dementia. No history of significant head injuries or alcohol use. Sleep is good. She has occasional back pain and constipation. She denies any headaches, dizziness, vision changes, focal numbness/tingling/weakness, neck pain, bladder dysfunction, anosmia, or tremors. She uses a walker at all times.   Laboratory Data: Lab Results  Component Value Date   TSH 0.99 10/15/2018   Lab Results  Component Value Date   [id number] 01/17/2014  PAST MEDICAL HISTORY: Past Medical History:  Diagnosis Date  . Anxiety    denies  . Atrial flutter ([place])   . Bilateral edema of lower extremity   . Breast cancer ([place])    left breast  . Chronic constipation   . Chronic diastolic CHF (congestive heart failure) ([place])   . Diverticulosis of colon   . GERD (gastroesophageal reflux  disease)   . H/O hiatal hernia   . History of cellulitis    LEFT LOWER LEG --  SEPT 2015  . History of colitis    DX  2003  WITH ISCHEMIC COLITIS  . History of diverticulitis of colon    perferated diverticulitis w/ abscess 08-25-2013 drain placed --  resolved without surgical intervention  . History of GI bleed    2011--- UPPER SECONARY GASTRIC ULCER FROM NSAID USE  . History of Helicobacter pylori infection    2011  . History of ulcer of lower limb    2012   RIGHT LOWER EXTREMITIY--  STATSIS ULCER  . Hyperlipidemia   . Hypertension   . Hypothyroidism   . Iron deficiency anemia 2011   elevated MCV  . LBP (low back pain)    severe lumbar spondylosis s/p L3/L4,L4/L5 fusion 12/10  . Lumbar spondylolysis   . OA (osteoarthritis)    "hands" (08/25/2013)  . Open wound of left lower leg   . Osteopenia   . Persistent atrial fibrillation ([place])    CARDIOLOGIST-   DR MCALANY  . RBBB   . Urinary frequency     PAST SURGICAL HISTORY: Past Surgical History:  Procedure Laterality Date  . APPLICATION OF A-CELL OF EXTREMITY Left 06/21/2013   Procedure: APPLICATION OF A-CELL OF EXTREMITY;  Surgeon: Theodoro Kos, DO;  Location: [facility];  Service: Plastics;  Laterality: Left;  . APPLICATION OF A-CELL OF EXTREMITY Left 08/22/2013   Procedure: APPLICATION OF A-CELL/VAC TO LOWER LEFT LEG WOUND;  Surgeon: Theodoro Kos, DO;  Location: [facility];  Service: Plastics;  Laterality: Left;  . APPLICATION OF A-CELL OF EXTREMITY Left 02/06/2014   Procedure: WITH PLACEMENT OF A -CELL ;  Surgeon: Theodoro Kos, DO;  Location: [facility];  Service: Plastics;  Laterality: Left;  . APPLICATION OF A-CELL OF EXTREMITY Left 08/09/2014   Procedure: PLACEMENT OF A CELL;  Surgeon: Theodoro Kos, DO;  Location: [facility];  Service: Plastics;  Laterality: Left;  . APPLICATION OF WOUND VAC Left 06/16/2013   Procedure: APPLICATION OF WOUND VAC;  Surgeon: Meredith Pel, MD;  Location: [facility];   Service: Orthopedics;  Laterality: Left;  . APPLICATION OF WOUND VAC Left 06/21/2013   Procedure: APPLICATION OF WOUND VAC;  Surgeon: Theodoro Kos, DO;  Location: [facility];  Service: Plastics;  Laterality: Left;  . BIOPSY BREAST  07/25/2015   LEFT RADIOACTIVE SEED GUIDED EXCISIONAL BREAST BIOPSY (Left) as a surgical intervention  . EXTERNAL FIXATION LEG Left 06/16/2013   Procedure: EXTERNAL FIXATION LEG;  Surgeon: Meredith Pel, MD;  Location: [facility];  Service: Orthopedics;  Laterality: Left;  . EXTERNAL FIXATION REMOVAL Left 06/21/2013   Procedure: REMOVAL EXTERNAL FIXATION LEG;  Surgeon: Meredith Pel, MD;  Location: [facility];  Service: Orthopedics;  Laterality: Left;  . EYE SURGERY Bilateral    cataract removal  . HARDWARE REMOVAL Left 05/23/2014   Procedure: HARDWARE REMOVAL;  Surgeon: Meredith Pel, MD;  Location: [facility];  Service: Orthopedics;  Laterality: Left;  REMOVAL OF HARDWARE LEFT TIBIA  . I &  D EXTREMITY Left 06/16/2013   Procedure: IRRIGATION AND DEBRIDEMENT EXTREMITY;  Surgeon: Meredith Pel, MD;  Location: [facility];  Service: Orthopedics;  Laterality: Left;  . I & D EXTREMITY Left 02/06/2014   Procedure: IRRIGATION AND DEBRIDEMENT LEFT LEG WOUND ;  Surgeon: Theodoro Kos, DO;  Location: [facility];  Service: Plastics;  Laterality: Left;  . I & D EXTREMITY Left 08/09/2014   Procedure: IRRIGATION AND DEBRIDEMENT  OF LEFT LOWER LEG;  Surgeon: Theodoro Kos, DO;  Location: [facility];  Service: Plastics;  Laterality: Left;  . LUMBAR FUSION  01-02-2009   L3-L5  . RADIOACTIVE SEED GUIDED EXCISIONAL BREAST BIOPSY Left 07/25/2015   Procedure: LEFT RADIOACTIVE SEED GUIDED EXCISIONAL BREAST BIOPSY;  Surgeon: Rolm Bookbinder, MD;  Location: [facility];  Service: General;  Laterality: Left;  . RE-EXCISION OF BREAST LUMPECTOMY Left 09/05/2015   Procedure: RE-EXCISION OF LEFT BREAST LUMPECTOMY;  Surgeon: Rolm Bookbinder, MD;  Location: [facility];  Service: General;  Laterality: Left;   . SHOULDER OPEN ROTATOR CUFF REPAIR Left 1997  . TIBIA IM NAIL INSERTION Left 06/21/2013   Procedure: INTRAMEDULLARY (IM) NAIL TIBIAL;  Surgeon: Meredith Pel, MD;  Location: [facility];  Service: Orthopedics;  Laterality: Left;  . TONSILLECTOMY  AS CHILD  . TOTAL HIP ARTHROPLASTY Right 06-07-2010  . TRANSTHORACIC ECHOCARDIOGRAM  09-11-2010   DR MCALHANY   LVSF  55-60%/  MILD MV CALCIFICATION WITH NO STENOSIS/  MILD MR & TR / MODERATE LAE/  MODERATE TO SEVERE RAE  . UPPER GI ENDOSCOPY  03-29-2010    MEDICATIONS: Current Outpatient Medications on File Prior to Visit  Medication Sig Dispense Refill  . acetaminophen (TYLENOL) 500 MG tablet Take 500 mg by mouth daily as needed for mild pain.     [name] anastrozole (ARIMIDEX) 1 MG tablet Take 1 tablet (1 mg total) by mouth daily. (Patient taking differently: Take 1 mg by mouth at bedtime. ) 90 tablet 3  . apixaban (ELIQUIS) 5 MG TABS tablet Take 1 tablet (5 mg total) by mouth 2 (two) times daily. 180 tablet 1  . atorvastatin (LIPITOR) 10 MG tablet Take 1 tablet (10 mg total) by mouth every evening. 90 tablet 1  . B Complex-C (B-COMPLEX WITH VITAMIN C) tablet Take 1 tablet by mouth daily.    . busPIRone (BUSPAR) 7.5 MG tablet Take 1 tablet (7.5 mg total) by mouth 3 (three) times daily. 270 tablet 1  . Cholecalciferol (VITAMIN D3) 1000 UNITS tablet Take 1,000 Units by mouth daily.      [name] diltiazem (CARDIZEM CD) 120 MG 24 hr capsule TAKE 1 CAPSULE (120 MG TOTAL) BY MOUTH DAILY. 90 capsule 3  . doxycycline (VIBRA-TABS) 100 MG tablet Take 1 tablet (100 mg total) by mouth 2 (two) times daily. 180 tablet 3  . levothyroxine (SYNTHROID) 25 MCG tablet TAKE 1 TABLET BY MOUTH DAILY FOR THYROID. (Patient taking differently: Take 25 mcg by mouth daily before breakfast. ) 30 tablet 11  . linaclotide (LINZESS) 290 MCG CAPS capsule Take 1 capsule (290 mcg total) by mouth daily. 90 capsule 3  . metoprolol succinate (TOPROL-XL) 50 MG 24 hr tablet Take 1 tablet (50 mg  total) by mouth 2 (two) times daily. Take with or immediately following a meal. 180 tablet 3  . Multiple Vitamins-Minerals (MULTIVITAMIN WITH MINERALS) tablet Take 1 tablet by mouth daily.    . polyethylene glycol (MIRALAX) 17 g packet Take 17 g by mouth daily. 30 each 0  . temazepam (RESTORIL) 15  MG capsule Take 1 capsule (15 mg total) by mouth at bedtime as needed for sleep. 90 capsule 1  . vitamin C (ASCORBIC ACID) 500 MG tablet Take 500 mg by mouth daily.     No current facility-administered medications on file prior to visit.    ALLERGIES: Allergies  Allergen Reactions  . Zosyn [Piperacillin Sod-Tazobactam So] Hives, Itching, Rash and Other (See Comments)    Morbilliform eruptions with itching- looks like Measles  . Atenolol Nausea And Vomiting  . Clarithromycin Nausea And Vomiting  . Codeine Sulfate Nausea Only  . Levaquin [Levofloxacin] Nausea Only  . Macrodantin Other (See Comments)    UNSPECIFIED   . Oxycodone-Acetaminophen Nausea And Vomiting  . Percocet [Oxycodone-Acetaminophen] Nausea And Vomiting    FAMILY HISTORY: Family History  Problem Relation Age of Onset  . Cancer Father   . Hypertension Other     SOCIAL HISTORY: Social History   Socioeconomic History  . Marital status: Married    Spouse name: Not on file  . Number of children: 2  . Years of education: Not on file  . Highest education level: Not on file  Occupational History  . Occupation: retired    Fish farm manager: UNEMPLOYED  Tobacco Use  . Smoking status: Never Smoker  . Smokeless tobacco: Never Used  Substance and Sexual Activity  . Alcohol use: No    Comment: 1 glass occ wine 08/03/14- none in a year  . Drug use: No  . Sexual activity: Not on file  Other Topics Concern  . Not on file  Social History Narrative   Right handed   Lives with husband   Doesn't use the steps   Social Determinants of Health   Financial Resource Strain:   . Difficulty of Paying Living Expenses: Not on file  Food  Insecurity:   . Worried About Charity fundraiser in the Last Year: Not on file  . Ran Out of Food in the Last Year: Not on file  Transportation Needs:   . Lack of Transportation (Medical): Not on file  . Lack of Transportation (Non-Medical): Not on file  Physical Activity:   . Days of Exercise per Week: Not on file  . Minutes of Exercise per Session: Not on file  Stress:   . Feeling of Stress : Not on file  Social Connections:   . Frequency of Communication with Friends and Family: Not on file  . Frequency of Social Gatherings with Friends and Family: Not on file  . Attends Religious Services: Not on file  . Active Member of Clubs or Organizations: Not on file  . Attends Archivist Meetings: Not on file  . Marital Status: Not on file  Intimate Partner Violence:   . Fear of Current or Ex-Partner: Not on file  . Emotionally Abused: Not on file  . Physically Abused: Not on file  . Sexually Abused: Not on file    REVIEW OF SYSTEMS: Constitutional: No fevers, chills, or sweats, no generalized fatigue, change in appetite Eyes: No visual changes, double vision, eye pain Ear, nose and throat: No hearing loss, ear pain, nasal congestion, sore throat Cardiovascular: No chest pain, palpitations Respiratory:  No shortness of breath at rest or with exertion, wheezes GastrointestinaI: No nausea, vomiting, diarrhea, abdominal pain, fecal incontinence Genitourinary:  No dysuria, urinary retention or frequency Musculoskeletal:  No neck pain,+ back pain Integumentary: No rash, pruritus, skin lesions Neurological: as above Psychiatric: No depression, insomnia, anxiety Endocrine: No palpitations, fatigue, diaphoresis, mood swings,  change in appetite, change in weight, increased thirst Hematologic/Lymphatic:  No anemia, purpura, petechiae. Allergic/Immunologic: no itchy/runny eyes, nasal congestion, recent allergic reactions, rashes  PHYSICAL EXAM: Vitals:   03/28/19 1354  BP:  136/76  Pulse: 66  SpO2: 96%   General: No acute distress Head:  Normocephalic/atraumatic Skin/Extremities: No rash, no edema Neurological Exam: Mental status: alert and oriented to person, place, and time, no dysarthria or aphasia, Fund of knowledge is reduced. Recent and remote memory are impaired. Attention and concentration are normal.  SLUMS score 16/30. [employer] Mental Exam 03/28/2019  Weekday Correct 1  Current year 1  What state are we in? 1  Amount spent 1  Amount left 0  # of Animals 1  5 objects recall 3  Number series 2  Hour markers 2  Time correct 0  Placed X in triangle correctly 1  Largest Figure 1  Name of female 0  Date back to work 0  Type of work 2  State she lived in 0  Total score 16    Cranial nerves: CN I: not tested CN II: pupils equal, round and reactive to light, visual fields intact CN III, IV, VI:  full range of motion, no nystagmus, no ptosis CN V: facial sensation intact CN VII: upper and lower face symmetric CN VIII: hearing intact to conversation Bulk & Tone: normal, no fasciculations. Motor: 5/5 throughout with no pronator drift. Sensation: intact to light touch, cold, pin, vibration and joint position sense.  No extinction to double simultaneous stimulation.  Romberg test negative Deep Tendon Reflexes: +1 throughout Cerebellar: no incoordination on finger to nose testing Gait: slow and cautious with walker, no ataxia Tremor: none  IMPRESSION: This is a pleasant 86 year old right-handed woman with a history of hypertension, hyperlipidemia, atrial fibrillation, presenting for evaluation of memory loss. She has had transient episodes where she would not recognize her husband or home. Her neurological exam is non-focal, SLUMS score 16/30. Etiology of symptoms unclear, considerations include seizure versus neurodegenerative disease (ie dementia). Aside from these episodes, she appears to have been managing complex tasks overall fine.  MRI brain without contrast and 1-hour EEG will be ordered. If tests are normal, we will do Neurocognitive testing to further evaluate symptoms. Continue close supervision, follow-up after tests.   Thank you for allowing me to participate in the care of this patient. Please do not hesitate to call for any questions or concerns.   Ellouise Newer, M.D.  CC: Dr. Alain Marion

## 2019-04-06 ENCOUNTER — Ambulatory Visit (INDEPENDENT_AMBULATORY_CARE_PROVIDER_SITE_OTHER): Payer: Medicare Other | Admitting: Neurology

## 2019-04-06 ENCOUNTER — Other Ambulatory Visit: Payer: Self-pay

## 2019-04-06 DIAGNOSIS — R404 Transient alteration of awareness: Secondary | ICD-10-CM

## 2019-04-08 NOTE — Procedures (Signed)
ELECTROENCEPHALOGRAM REPORT  Date of Study: 04/06/2019  Patient's Name: [name] MRN: [id number] Date of Birth: 1925/07/11  Referring Provider: Dr. Ellouise Newer  Clinical History: This is a 86 year old woman with recurrent transient episodes of confusion.  Medications: TYLENOL 500 MG tablet ARIMIDEX 1 MG tablet ELIQUIS 5 MG TABS tablet LIPITOR 10 MG tablet B-COMPLEX WITH VITAMIN C tablet BUSPAR 7.5 MG tablet VITAMIN D3 1000 UNITS tablet CARDIZEM CD 120 MG 24 hr capsule VIBRA-TABS 100 MG tablet SYNTHROID 25 MCG tablet LINZESS 290 MCG CAPS capsule TOPROL-XL 50 MG 24 hr tablet MULTIVITAMIN WITH MINERALS tablet MIRALAX 17 g packet ASCORBIC ACID 500 MG tablet  Technical Summary: A multichannel digital 1-hour EEG recording measured by the international 10-20 system with electrodes applied with paste and impedances below 5000 ohms performed in our laboratory with EKG monitoring in an awake and asleep patient.  Hyperventilation was not performed. Photic stimulation was performed.  The digital EEG was referentially recorded, reformatted, and digitally filtered in a variety of bipolar and referential montages for optimal display.    Description: The patient is awake and asleep during the recording.  During maximal wakefulness, there is a symmetric, medium voltage 9 Hz posterior dominant rhythm that attenuates with eye opening.  The record is symmetric.  During drowsiness and sleep, there is an increase in theta slowing of the background.  Vertex waves and symmetric sleep spindles were seen.  Photic stimulation did not elicit any abnormalities.  There were no epileptiform discharges or electrographic seizures seen.    EKG lead was unremarkable.  Impression: This 1-hour awake and asleep EEG is normal.    Clinical Correlation: A normal EEG does not exclude a clinical diagnosis of epilepsy.  If further clinical questions remain, prolonged EEG may be helpful.  Clinical correlation is  advised.   Ellouise Newer, M.D.

## 2019-04-11 ENCOUNTER — Telehealth: Payer: Self-pay

## 2019-04-11 NOTE — Telephone Encounter (Signed)
Voice mail left for [place] per DPR . EEG was normal. Proceed with MRI brain as scheduled. If normal, we will plan to do Neurocognitive testing

## 2019-04-13 ENCOUNTER — Telehealth: Payer: Self-pay | Admitting: Neurology

## 2019-04-13 NOTE — Telephone Encounter (Signed)
Pt called given results EEG was normal. Proceed with MRI brain as scheduled. If normal, we will plan to do Neurocognitive testing

## 2019-04-13 NOTE — Telephone Encounter (Signed)
Pt would like the results of the test she took last week

## 2019-04-13 NOTE — Telephone Encounter (Signed)
Looks like you left a voicemail 2 days ago, pls convey results on prior phone note, thanks!!

## 2019-05-02 ENCOUNTER — Ambulatory Visit
Admission: RE | Admit: 2019-05-02 | Discharge: 2019-05-02 | Disposition: A | Discharge status: Home / Self Care | Payer: Medicare Other | Source: Ambulatory Visit | Attending: Neurology | Admitting: Neurology

## 2019-05-02 DIAGNOSIS — R404 Transient alteration of awareness: Secondary | ICD-10-CM

## 2019-05-04 ENCOUNTER — Telehealth: Payer: Self-pay

## 2019-05-04 DIAGNOSIS — R404 Transient alteration of awareness: Secondary | ICD-10-CM

## 2019-05-04 DIAGNOSIS — R413 Other amnesia: Secondary | ICD-10-CM

## 2019-05-04 NOTE — Telephone Encounter (Signed)
-----   Message from Cameron Sprang, MD sent at 05/03/2019  8:39 AM EDT ----- Pls let [name] [facility]) know the MRI brain did not show any evidence of tumor, stroke, or bleed. It showed age-related changes and hardening of the small blood vessels seen in patients with blood pressure and cholesterol issues. Since no clear explanation of symptoms, would do Neurocognitive testing to further evaluate her memory and cognition. Thanks

## 2019-05-04 NOTE — Telephone Encounter (Signed)
Pt called went over MRI results tried to call Lattie Haw POA no answer voice mail left for pt POA to call back

## 2019-05-04 NOTE — Telephone Encounter (Signed)
-----   Message from Cameron Sprang, MD sent at 05/03/2019  8:39 AM EDT ----- Pls let Lattie Haw [name]) know the MRI brain did not show any evidence of tumor, stroke, or bleed. It showed age-related changes and hardening of the small blood vessels seen in patients with blood pressure and cholesterol issues. Since no clear explanation of symptoms, would do Neurocognitive testing to further evaluate her memory and cognition. Thanks

## 2019-05-04 NOTE — Telephone Encounter (Signed)
Spoke with pts granddaughter MRI brain did not show any evidence of tumor, stroke, or bleed. It showed age-related changes and hardening of the small blood vessels seen in patients with blood pressure and cholesterol issues. Since no clear explanation of symptoms, would do Neurocognitive testing to further evaluate her memory and cognition. They would like to do the neurocognitive testing

## 2019-05-11 ENCOUNTER — Telehealth: Payer: Self-pay | Admitting: Internal Medicine

## 2019-05-11 NOTE — Telephone Encounter (Signed)
New message:   Pt is calling and states she would like to speak with Dr. Camila Li. She states she has a question for him before she goes into any further detail. Please advise.

## 2019-05-12 NOTE — Telephone Encounter (Signed)
Spoke with pt and she is concerned about testing that neuro is requesting. Per neuro notes "MRI brain did not show any evidence of tumor, stroke, or bleed. It showed age-related changes and hardening of the small blood vessels seen in patients with blood pressure and cholesterol issues. Since no clear explanation of symptoms, would do Neurocognitive testing to further evaluate her memory and cognition. "   Pt would like to know your opinion on this testing considering her age.

## 2019-05-13 NOTE — Telephone Encounter (Signed)
I'm ok with [name]'s Neurocognitive testing to further evaluate her memory and cognition. Thx

## 2019-05-13 NOTE — Telephone Encounter (Signed)
Pt.notified

## 2019-05-18 DIAGNOSIS — I8311 Varicose veins of right lower extremity with inflammation: Secondary | ICD-10-CM | POA: Diagnosis not present

## 2019-05-18 DIAGNOSIS — Z85828 Personal history of other malignant neoplasm of skin: Secondary | ICD-10-CM | POA: Diagnosis not present

## 2019-05-18 DIAGNOSIS — L308 Other specified dermatitis: Secondary | ICD-10-CM | POA: Diagnosis not present

## 2019-05-18 DIAGNOSIS — I8312 Varicose veins of left lower extremity with inflammation: Secondary | ICD-10-CM | POA: Diagnosis not present

## 2019-05-18 DIAGNOSIS — I872 Venous insufficiency (chronic) (peripheral): Secondary | ICD-10-CM | POA: Diagnosis not present

## 2019-06-08 DIAGNOSIS — L308 Other specified dermatitis: Secondary | ICD-10-CM | POA: Diagnosis not present

## 2019-06-08 DIAGNOSIS — D485 Neoplasm of uncertain behavior of skin: Secondary | ICD-10-CM | POA: Diagnosis not present

## 2019-06-08 DIAGNOSIS — I8312 Varicose veins of left lower extremity with inflammation: Secondary | ICD-10-CM | POA: Diagnosis not present

## 2019-06-08 DIAGNOSIS — I872 Venous insufficiency (chronic) (peripheral): Secondary | ICD-10-CM | POA: Diagnosis not present

## 2019-06-08 DIAGNOSIS — L218 Other seborrheic dermatitis: Secondary | ICD-10-CM | POA: Diagnosis not present

## 2019-06-08 DIAGNOSIS — C44722 Squamous cell carcinoma of skin of right lower limb, including hip: Secondary | ICD-10-CM | POA: Diagnosis not present

## 2019-06-08 DIAGNOSIS — Z85828 Personal history of other malignant neoplasm of skin: Secondary | ICD-10-CM | POA: Diagnosis not present

## 2019-06-08 DIAGNOSIS — I8311 Varicose veins of right lower extremity with inflammation: Secondary | ICD-10-CM | POA: Diagnosis not present

## 2019-06-14 ENCOUNTER — Other Ambulatory Visit: Payer: Self-pay | Admitting: Cardiovascular Disease

## 2019-06-14 MED ORDER — APIXABAN 5 MG PO TABS: 5.0000 mg | ORAL_TABLET | Freq: Two times a day (BID) | ORAL | 1 refills | Status: DC

## 2019-06-14 NOTE — Telephone Encounter (Signed)
Prescription refill request for Eliquis received.  Last office visit: [place]. 08/11/2018 Scr: 1.04, 02/15/2019 Age: 86 y.o. Weight: 80.2 kg   Prescription refill sent.

## 2019-06-14 NOTE — Telephone Encounter (Signed)
  *  STAT* If patient is at the pharmacy, call can be transferred to refill team.   1. Which medications need to be refilled? (please list name of each medication and dose if known)   apixaban (ELIQUIS) 5 MG TABS tablet    2. Which pharmacy/location (including street and city if local pharmacy) is medication to be sent to?[facility] [place], [place] - [address]  3. Do they need a 30 day or 90 day supply? 90 days

## 2019-06-16 ENCOUNTER — Encounter: Payer: Self-pay | Admitting: Internal Medicine

## 2019-06-16 ENCOUNTER — Ambulatory Visit (INDEPENDENT_AMBULATORY_CARE_PROVIDER_SITE_OTHER): Payer: Medicare Other | Admitting: Internal Medicine

## 2019-06-16 ENCOUNTER — Other Ambulatory Visit: Payer: Self-pay

## 2019-06-16 VITALS — BP 120/80 | HR 88 | Temp 97.8°F | Ht 65.5 in | Wt 169.0 lb

## 2019-06-16 DIAGNOSIS — C4490 Unspecified malignant neoplasm of skin, unspecified: Secondary | ICD-10-CM | POA: Insufficient documentation

## 2019-06-16 DIAGNOSIS — I10 Essential (primary) hypertension: Secondary | ICD-10-CM | POA: Diagnosis not present

## 2019-06-16 DIAGNOSIS — R413 Other amnesia: Secondary | ICD-10-CM | POA: Diagnosis not present

## 2019-06-16 DIAGNOSIS — E039 Hypothyroidism, unspecified: Secondary | ICD-10-CM

## 2019-06-16 LAB — BASIC METABOLIC PANEL
BUN: 24 mg/dL — ABNORMAL HIGH (ref 6–23)
CO2: 33 mEq/L — ABNORMAL HIGH (ref 19–32)
Calcium: 10.1 mg/dL (ref 8.4–10.5)
Chloride: 99 mEq/L (ref 96–112)
Creatinine, Ser: 1.15 mg/dL (ref 0.40–1.20)
GFR: 44.17 mL/min — ABNORMAL LOW (ref >60.00)
Glucose, Bld: 161 mg/dL — ABNORMAL HIGH (ref 70–99)
Potassium: 3.7 mEq/L (ref 3.5–5.1)
Sodium: 137 mEq/L (ref 135–145)

## 2019-06-16 LAB — HEPATIC FUNCTION PANEL
ALT: 12 U/L (ref 0–35)
AST: 20 U/L (ref 0–37)
Albumin: 4.1 g/dL (ref 3.5–5.2)
Alkaline Phosphatase: 67 U/L (ref 39–117)
Bilirubin, Direct: 0.4 mg/dL — ABNORMAL HIGH (ref 0.0–0.3)
Total Bilirubin: 1.2 mg/dL (ref 0.2–1.2)
Total Protein: 7.6 g/dL (ref 6.0–8.3)

## 2019-06-16 LAB — TSH: TSH: 0.66 u[IU]/mL (ref 0.35–4.50)

## 2019-06-16 NOTE — Assessment & Plan Note (Signed)
TSH 

## 2019-06-16 NOTE — Assessment & Plan Note (Signed)
F/u w/Dr Delice Lesch. Neuro-cognitive test is pending. COVID isolation is contributing.

## 2019-06-16 NOTE — Addendum Note (Signed)
Addended by: Boris Lown B on: 06/16/2019 03:50 PM   Modules accepted: Orders

## 2019-06-16 NOTE — Assessment & Plan Note (Signed)
RLE - large. Appt w/Dr Elvera Lennox

## 2019-06-16 NOTE — Progress Notes (Signed)
Subjective:  Patient ID: [name], female    DOB: 01-13-1926  Age: 86 y.o. MRN: [id number]  CC: No chief complaint on file.   HPI HEBE SMOLIN presents for HTN, CAD, h/o breast cancer  Outpatient Medications Prior to Visit  Medication Sig Dispense Refill  . acetaminophen (TYLENOL) 500 MG tablet Take 500 mg by mouth daily as needed for mild pain.     [name] anastrozole (ARIMIDEX) 1 MG tablet Take 1 tablet (1 mg total) by mouth daily. (Patient taking differently: Take 1 mg by mouth at bedtime. ) 90 tablet 3  . apixaban (ELIQUIS) 5 MG TABS tablet Take 1 tablet (5 mg total) by mouth 2 (two) times daily. 180 tablet 1  . atorvastatin (LIPITOR) 10 MG tablet Take 1 tablet (10 mg total) by mouth every evening. 90 tablet 1  . B Complex-C (B-COMPLEX WITH VITAMIN C) tablet Take 1 tablet by mouth daily.    . busPIRone (BUSPAR) 7.5 MG tablet Take 1 tablet (7.5 mg total) by mouth 3 (three) times daily. 270 tablet 1  . Cholecalciferol (VITAMIN D3) 1000 UNITS tablet Take 1,000 Units by mouth daily.      [name] diltiazem (CARDIZEM CD) 120 MG 24 hr capsule TAKE 1 CAPSULE (120 MG TOTAL) BY MOUTH DAILY. 90 capsule 3  . doxycycline (VIBRA-TABS) 100 MG tablet Take 1 tablet (100 mg total) by mouth 2 (two) times daily. 180 tablet 3  . levothyroxine (SYNTHROID) 25 MCG tablet TAKE 1 TABLET BY MOUTH DAILY FOR THYROID. (Patient taking differently: Take 25 mcg by mouth daily before breakfast. ) 30 tablet 11  . linaclotide (LINZESS) 290 MCG CAPS capsule Take 1 capsule (290 mcg total) by mouth daily. 90 capsule 3  . metoprolol succinate (TOPROL-XL) 50 MG 24 hr tablet Take 1 tablet (50 mg total) by mouth 2 (two) times daily. Take with or immediately following a meal. 180 tablet 3  . Multiple Vitamins-Minerals (MULTIVITAMIN WITH MINERALS) tablet Take 1 tablet by mouth daily.    . polyethylene glycol (MIRALAX) 17 g packet Take 17 g by mouth daily. 30 each 0  . temazepam (RESTORIL) 15 MG capsule Take 1 capsule (15 mg total) by  mouth at bedtime as needed for sleep. 90 capsule 1  . vitamin C (ASCORBIC ACID) 500 MG tablet Take 500 mg by mouth daily.     No facility-administered medications prior to visit.    ROS: Review of Systems  Constitutional: Negative for activity change, appetite change, chills, fatigue and unexpected weight change.  HENT: Negative for congestion, mouth sores and sinus pressure.   Eyes: Negative for visual disturbance.  Respiratory: Negative for cough and chest tightness.   Gastrointestinal: Negative for abdominal pain and nausea.  Genitourinary: Negative for difficulty urinating, frequency and vaginal pain.  Musculoskeletal: Negative for back pain and gait problem.  Skin: Negative for pallor and rash.  Neurological: Negative for dizziness, tremors, weakness, numbness and headaches.  Psychiatric/Behavioral: Negative for confusion, sleep disturbance and suicidal ideas.    Objective:  BP 120/80 (BP Location: Left Arm)   Pulse 88   Temp 97.8 F (36.6 C) (Oral)   Ht 5' 5.5" (1.664 m)   Wt 169 lb (76.7 kg)   SpO2 95%   BMI 27.70 kg/m   BP Readings from Last 3 Encounters:  06/16/19 120/80  03/28/19 136/76  02/15/19 134/76    Wt Readings from Last 3 Encounters:  06/16/19 169 lb (76.7 kg)  03/28/19 176 lb 12.8 oz (80.2 kg)  02/15/19  169 lb (76.7 kg)    Physical Exam Constitutional:      General: She is not in acute distress.    Appearance: She is well-developed.  HENT:     Head: Normocephalic.     Right Ear: External ear normal.     Left Ear: External ear normal.     Nose: Nose normal.  Eyes:     General:        Right eye: No discharge.        Left eye: No discharge.     Conjunctiva/sclera: Conjunctivae normal.     Pupils: Pupils are equal, round, and reactive to light.  Neck:     Thyroid: No thyromegaly.     Vascular: No JVD.     Trachea: No tracheal deviation.  Cardiovascular:     Rate and Rhythm: Normal rate and regular rhythm.     Heart sounds: Normal heart  sounds.  Pulmonary:     Effort: No respiratory distress.     Breath sounds: No stridor. No wheezing.  Abdominal:     General: Bowel sounds are normal. There is no distension.     Palpations: Abdomen is soft. There is no mass.     Tenderness: There is no abdominal tenderness. There is no guarding or rebound.  Musculoskeletal:        General: No tenderness.     Cervical back: Normal range of motion and neck supple.  Lymphadenopathy:     Cervical: No cervical adenopathy.  Skin:    Findings: No erythema or rash.  Neurological:     Cranial Nerves: No cranial nerve deficit.     Motor: No abnormal muscle tone.     Coordination: Coordination normal.     Deep Tendon Reflexes: Reflexes normal.  Psychiatric:        Behavior: Behavior normal.        Thought Content: Thought content normal.        Judgment: Judgment normal.     Lab Results  Component Value Date   WBC 4.6 02/15/2019   HGB 12.9 02/15/2019   HCT 37.7 02/15/2019   PLT 211.0 02/15/2019   GLUCOSE 114 (H) 02/15/2019   CHOL 116 06/25/2012   TRIG 82 09/05/2013   HDL 41.50 06/25/2012   LDLDIRECT 110.3 05/24/2008   LDLCALC 66 06/25/2012   ALT 13 02/15/2019   AST 23 02/15/2019   NA 133 (L) 02/15/2019   K 4.0 02/15/2019   CL 96 02/15/2019   CREATININE 1.04 02/15/2019   BUN 24 (H) 02/15/2019   CO2 29 02/15/2019   TSH 0.99 10/15/2018   INR 1.33 05/23/2017   HGBA1C 5.9 (H) 10/07/2018    MR BRAIN WO CONTRAST  Result Date: 05/02/2019 CLINICAL DATA:  Transient alteration of awareness. Cognitive decline. EXAM: MRI HEAD WITHOUT CONTRAST TECHNIQUE: Multiplanar, multiecho pulse sequences of the brain and surrounding structures were obtained without intravenous contrast. COMPARISON:  None. FINDINGS: Brain: There is no evidence of acute infarct, mass, midline shift, or extra-axial fluid collection. Small foci of susceptibility artifact in both thalami are compatible with chronic microhemorrhages, likely related to chronic  hypertension. Patchy to confluent T2 hyperintensities in the cerebral white matter bilaterally are nonspecific but compatible with moderately advanced chronic small vessel ischemic disease. Milder chronic small vessel changes are present in the pons. There is a subcentimeter chronic infarct inferiorly in the left cerebellar hemisphere. Cerebral atrophy is mild-to-moderate for age with sulcal enlargement most notable in the parieto-occipital regions. Vascular: Major intracranial vascular  flow voids are preserved. Skull and upper cervical spine: No suspicious marrow lesion. Advanced disc and facet degeneration in the cervical spine. Sinuses/Orbits: Bilateral cataract extraction. Paranasal sinuses and mastoid air cells are clear. Other: None. IMPRESSION: 1. No acute intracranial abnormality. 2. Moderately advanced chronic small vessel ischemic disease and mild-to-moderate cerebral atrophy. Electronically Signed   By: Logan Bores M.D.   On: 05/02/2019 17:21    Assessment & Plan:    Follow-up: No follow-ups on file.  Walker Kehr, MD

## 2019-06-23 ENCOUNTER — Telehealth: Payer: Self-pay

## 2019-06-23 NOTE — Telephone Encounter (Signed)
Asking for a 90 day supply   1.Medication Requested:atorvastatin (LIPITOR) 10 MG tablet   2. Pharmacy (Name, Street, [place]):[facility] [place], [place] - [address]  3. On Med List: Yes   4. Last Visit with PCP: 5.27.21   5. Next visit date with PCP: 9.27.21    Agent: Please be advised that RX refills may take up to 3 business days. We ask that you follow-up with your pharmacy.

## 2019-06-24 MED ORDER — ATORVASTATIN CALCIUM 10 MG PO TABS: 10.0000 mg | ORAL_TABLET | Freq: Every evening | ORAL | 3 refills | Status: DC

## 2019-06-24 NOTE — Telephone Encounter (Signed)
Refill sent. See meds.  

## 2019-06-30 ENCOUNTER — Encounter: Payer: Medicare Other | Admitting: Psychology

## 2019-07-07 ENCOUNTER — Encounter: Payer: Medicare Other | Admitting: Psychology

## 2019-08-22 ENCOUNTER — Telehealth: Payer: Self-pay

## 2019-08-22 NOTE — Telephone Encounter (Signed)
New message    The patient C/o pain shoulder and back wants to know what she could take to help with the pain.   Please advise

## 2019-08-23 NOTE — Telephone Encounter (Signed)
Use Tylenol 650 mg up to 4 times a day.  Use heat or ice.  Can use Aspercreme. Thanks

## 2019-08-23 NOTE — Telephone Encounter (Signed)
F/u    The patient is asking for the CMA to call her to discuss yesterday message

## 2019-08-24 NOTE — Telephone Encounter (Signed)
Pt informed of below.  

## 2019-08-25 ENCOUNTER — Telehealth: Payer: Self-pay | Admitting: Internal Medicine

## 2019-08-25 ENCOUNTER — Telehealth: Payer: Self-pay

## 2019-08-25 NOTE — Telephone Encounter (Signed)
New message:   Pt is calling and would like a call back from the MA. Please advise.

## 2019-08-25 NOTE — Telephone Encounter (Signed)
Pt states she is not getting any relief on back and shoulder pain from increasing tylenol dosage to 650mg . And would like something sent in for her pain. I advised pt to see another provider tomorrow and she is unable to. Please advise

## 2019-08-26 NOTE — Telephone Encounter (Signed)
Pt informed of below.  

## 2019-08-26 NOTE — Telephone Encounter (Signed)
See 08/26/2019 telephone encounter

## 2019-08-26 NOTE — Telephone Encounter (Signed)
Left pt a vm to give me a call back

## 2019-08-26 NOTE — Telephone Encounter (Signed)
    Please return call to patient Appointment scheduled for 8/12

## 2019-08-26 NOTE — Telephone Encounter (Signed)
Hold atorvastatin for 10 days. Continue with Tylenol. Use over-the-counter Voltaren gel topically 4 times a day. Use ice/heat. Please make appointment to see me, we can inject her shoulder with steroids. Thanks

## 2019-08-29 ENCOUNTER — Other Ambulatory Visit: Payer: Self-pay

## 2019-08-29 ENCOUNTER — Telehealth: Payer: Self-pay

## 2019-08-29 MED ORDER — LEVOTHYROXINE SODIUM 25 MCG PO TABS: ORAL_TABLET | ORAL | 11 refills | Status: DC

## 2019-08-29 NOTE — Telephone Encounter (Signed)
New message    1.Medication Requested:levothyroxine (SYNTHROID) 25 MCG tablet - asking for a 90 days supply    2. Pharmacy (Name, Street, [place]):[facility] [place], [place] - [address]  3. On Med List: Yes   4. Last Visit with PCP: 5.27.21   5. Next visit date with PCP: 8.12.21    Agent: Please be advised that RX refills may take up to 3 business days. We ask that you follow-up with your pharmacy.

## 2019-08-31 DIAGNOSIS — L218 Other seborrheic dermatitis: Secondary | ICD-10-CM | POA: Diagnosis not present

## 2019-08-31 DIAGNOSIS — Z85828 Personal history of other malignant neoplasm of skin: Secondary | ICD-10-CM | POA: Diagnosis not present

## 2019-09-01 ENCOUNTER — Telehealth: Payer: Self-pay

## 2019-09-01 ENCOUNTER — Other Ambulatory Visit: Payer: Self-pay

## 2019-09-01 ENCOUNTER — Ambulatory Visit (INDEPENDENT_AMBULATORY_CARE_PROVIDER_SITE_OTHER): Payer: Medicare Other | Admitting: Internal Medicine

## 2019-09-01 ENCOUNTER — Ambulatory Visit (INDEPENDENT_AMBULATORY_CARE_PROVIDER_SITE_OTHER): Payer: Medicare Other

## 2019-09-01 ENCOUNTER — Encounter: Payer: Self-pay | Admitting: Internal Medicine

## 2019-09-01 VITALS — BP 188/110 | HR 73 | Temp 98.1°F | Ht 65.5 in | Wt 172.0 lb

## 2019-09-01 DIAGNOSIS — M25512 Pain in left shoulder: Secondary | ICD-10-CM | POA: Diagnosis not present

## 2019-09-01 DIAGNOSIS — I4892 Unspecified atrial flutter: Secondary | ICD-10-CM | POA: Diagnosis not present

## 2019-09-01 DIAGNOSIS — M19011 Primary osteoarthritis, right shoulder: Secondary | ICD-10-CM | POA: Diagnosis not present

## 2019-09-01 DIAGNOSIS — M25511 Pain in right shoulder: Secondary | ICD-10-CM | POA: Diagnosis not present

## 2019-09-01 DIAGNOSIS — I10 Essential (primary) hypertension: Secondary | ICD-10-CM | POA: Diagnosis not present

## 2019-09-01 DIAGNOSIS — M4134 Thoracogenic scoliosis, thoracic region: Secondary | ICD-10-CM | POA: Diagnosis not present

## 2019-09-01 DIAGNOSIS — G8929 Other chronic pain: Secondary | ICD-10-CM | POA: Diagnosis not present

## 2019-09-01 DIAGNOSIS — M546 Pain in thoracic spine: Secondary | ICD-10-CM | POA: Diagnosis not present

## 2019-09-01 MED ORDER — KETOROLAC TROMETHAMINE 30 MG/ML IJ SOLN
30.0000 mg | Freq: Once | INTRAMUSCULAR | Status: AC
Start: 2019-09-01 — End: 2019-09-01
  Administered 2019-09-01: 30 mg via INTRAMUSCULAR

## 2019-09-01 MED ORDER — METHYLPREDNISOLONE ACETATE 40 MG/ML IJ SUSP
40.0000 mg | Freq: Once | INTRAMUSCULAR | Status: AC
  Administered 2019-09-01: 40 mg via INTRAMUSCULAR

## 2019-09-01 MED ORDER — GABAPENTIN 100 MG PO CAPS
100.0000 mg | ORAL_CAPSULE | Freq: Three times a day (TID) | ORAL | 3 refills | Status: DC | PRN
Start: 2019-09-01 — End: 2019-09-15

## 2019-09-01 MED ORDER — METHYLPREDNISOLONE 4 MG PO TBPK: ORAL_TABLET | ORAL | 0 refills | Status: DC

## 2019-09-01 NOTE — Progress Notes (Addendum)
Subjective:  Patient ID: [name], female    DOB: 1926-01-14  Age: 86 y.o. MRN: [id number]  CC: No chief complaint on file.   HPI [name] presents with her daughter with a complaint of severe B shoulder pain for the past couple weeks.  No injury.  The pain is located in both shoulders and in the area of the right scapula.  Tylenol does not help.  As the visit progressed [name] became very tearful, upset and miserable.  She started to complain of the pain in the thoracic spine area.  She is a poor historian.  Denies morning stiffness, hip pain, change in the old chronic low back pain.  Outpatient Medications Prior to Visit  Medication Sig Dispense Refill  . acetaminophen (TYLENOL) 500 MG tablet Take 500 mg by mouth daily as needed for mild pain.     [name] anastrozole (ARIMIDEX) 1 MG tablet Take 1 tablet (1 mg total) by mouth daily. (Patient taking differently: Take 1 mg by mouth at bedtime. ) 90 tablet 3  . apixaban (ELIQUIS) 5 MG TABS tablet Take 1 tablet (5 mg total) by mouth 2 (two) times daily. 180 tablet 1  . atorvastatin (LIPITOR) 10 MG tablet Take 1 tablet (10 mg total) by mouth every evening. 90 tablet 3  . B Complex-C (B-COMPLEX WITH VITAMIN C) tablet Take 1 tablet by mouth daily.    . busPIRone (BUSPAR) 7.5 MG tablet Take 1 tablet (7.5 mg total) by mouth 3 (three) times daily. 270 tablet 1  . Cholecalciferol (VITAMIN D3) 1000 UNITS tablet Take 1,000 Units by mouth daily.      [name] diltiazem (CARDIZEM CD) 120 MG 24 hr capsule TAKE 1 CAPSULE (120 MG TOTAL) BY MOUTH DAILY. 90 capsule 3  . doxycycline (VIBRA-TABS) 100 MG tablet Take 1 tablet (100 mg total) by mouth 2 (two) times daily. 180 tablet 3  . levothyroxine (SYNTHROID) 25 MCG tablet TAKE 1 TABLET BY MOUTH DAILY FOR THYROID. 30 tablet 11  . linaclotide (LINZESS) 290 MCG CAPS capsule Take 1 capsule (290 mcg total) by mouth daily. 90 capsule 3  . metoprolol succinate (TOPROL-XL) 50 MG 24 hr tablet Take 1 tablet (50 mg total) by  mouth 2 (two) times daily. Take with or immediately following a meal. 180 tablet 3  . Multiple Vitamins-Minerals (MULTIVITAMIN WITH MINERALS) tablet Take 1 tablet by mouth daily.    . polyethylene glycol (MIRALAX) 17 g packet Take 17 g by mouth daily. 30 each 0  . temazepam (RESTORIL) 15 MG capsule Take 1 capsule (15 mg total) by mouth at bedtime as needed for sleep. 90 capsule 1  . vitamin C (ASCORBIC ACID) 500 MG tablet Take 500 mg by mouth daily.     No facility-administered medications prior to visit.    ROS: Review of Systems  Constitutional: Negative for activity change, appetite change, chills, fatigue, fever and unexpected weight change.  HENT: Negative for congestion, mouth sores and sinus pressure.   Eyes: Negative for visual disturbance.  Respiratory: Negative for cough and chest tightness.   Gastrointestinal: Negative for abdominal pain and nausea.  Genitourinary: Negative for difficulty urinating, frequency and vaginal pain.  Musculoskeletal: Positive for arthralgias, back pain and gait problem. Negative for myalgias, neck pain and neck stiffness.  Skin: Negative for pallor and rash.  Neurological: Positive for light-headedness. Negative for dizziness, tremors, weakness, numbness and headaches.  Hematological: Does not bruise/bleed easily.  Psychiatric/Behavioral: Positive for confusion and sleep disturbance. Negative for suicidal ideas.  The patient is nervous/anxious.     Objective:  BP (!) 188/110 (BP Location: Left Arm, Patient Position: Sitting, Cuff Size: Normal)   Pulse 73   Temp 98.1 F (36.7 C) (Oral)   Ht 5' 5.5" (1.664 m)   Wt 172 lb (78 kg)   SpO2 97%   BMI 28.19 kg/m   BP Readings from Last 3 Encounters:  09/01/19 (!) 188/110  06/16/19 120/80  03/28/19 136/76    Wt Readings from Last 3 Encounters:  09/01/19 172 lb (78 kg)  06/16/19 169 lb (76.7 kg)  03/28/19 176 lb 12.8 oz (80.2 kg)    Physical Exam Constitutional:      General: She is not in  acute distress.    Appearance: She is well-developed.  HENT:     Head: Normocephalic.     Right Ear: External ear normal.     Left Ear: External ear normal.     Nose: Nose normal.  Eyes:     General:        Right eye: No discharge.        Left eye: No discharge.     Conjunctiva/sclera: Conjunctivae normal.     Pupils: Pupils are equal, round, and reactive to light.  Neck:     Thyroid: No thyromegaly.     Vascular: No JVD.     Trachea: No tracheal deviation.  Cardiovascular:     Rate and Rhythm: Normal rate and regular rhythm.     Heart sounds: Normal heart sounds.  Pulmonary:     Effort: No respiratory distress.     Breath sounds: No stridor. No wheezing.  Abdominal:     General: Bowel sounds are normal. There is no distension.     Palpations: Abdomen is soft. There is no mass.     Tenderness: There is no abdominal tenderness. There is no guarding or rebound.  Musculoskeletal:        General: Tenderness present. No deformity.     Cervical back: Normal range of motion and neck supple.     Right lower leg: No edema.     Left lower leg: No edema.  Lymphadenopathy:     Cervical: No cervical adenopathy.  Skin:    Findings: No erythema or rash.  Neurological:     Cranial Nerves: No cranial nerve deficit.     Motor: Weakness present. No abnormal muscle tone.     Coordination: Coordination abnormal.     Gait: Gait abnormal.     Deep Tendon Reflexes: Reflexes normal.  Psychiatric:        Thought Content: Thought content normal.        Judgment: Judgment normal.   Tearful Both shoulders are tender with range of motion Using a walker   Procedure :Joint Injection, left and right shoulder   Indication:  Subacromial bursitis with refractory bilateral shoulder chronic pain.   Risks including unsuccessful procedure , bleeding, infection, bruising, skin atrophy, "steroid flare-up" and others were explained to the patient in detail as well as the benefits. Informed consent was  obtained and signed.   Tthe patient was placed in a comfortable position. Lateral approach was used. Skin was prepped with Betadine and alcohol  and anesthetized with a cooling spray. Then, a 5 cc syringe with a 2 inch long 24-gauge needle was used for a joint injection.. The needle was advanced  Into the subacromial space.The bursa was injected with 3 mL of 2% lidocaine and 40 mg of Depo-Medrol .  Band-Aid  was applied.  Both shoulders were injected and in the identical fashion.   Tolerated well. Complications: None. Good pain relief following the procedure.    FTF>45 min: time was spent on coordination of care, reviewing past records, lab results and X rays; educating the patient or family.    Lab Results  Component Value Date   WBC 4.6 02/15/2019   HGB 12.9 02/15/2019   HCT 37.7 02/15/2019   PLT 211.0 02/15/2019   GLUCOSE 161 (H) 06/16/2019   CHOL 116 06/25/2012   TRIG 82 09/05/2013   HDL 41.50 06/25/2012   LDLDIRECT 110.3 05/24/2008   LDLCALC 66 06/25/2012   ALT 12 06/16/2019   AST 20 06/16/2019   NA 137 06/16/2019   K 3.7 06/16/2019   CL 99 06/16/2019   CREATININE 1.15 06/16/2019   BUN 24 (H) 06/16/2019   CO2 33 (H) 06/16/2019   TSH 0.66 06/16/2019   INR 1.33 05/23/2017   HGBA1C 5.9 (H) 10/07/2018    MR BRAIN WO CONTRAST  Result Date: 05/02/2019 CLINICAL DATA:  Transient alteration of awareness. Cognitive decline. EXAM: MRI HEAD WITHOUT CONTRAST TECHNIQUE: Multiplanar, multiecho pulse sequences of the brain and surrounding structures were obtained without intravenous contrast. COMPARISON:  None. FINDINGS: Brain: There is no evidence of acute infarct, mass, midline shift, or extra-axial fluid collection. Small foci of susceptibility artifact in both thalami are compatible with chronic microhemorrhages, likely related to chronic hypertension. Patchy to confluent T2 hyperintensities in the cerebral white matter bilaterally are nonspecific but compatible with moderately advanced  chronic small vessel ischemic disease. Milder chronic small vessel changes are present in the pons. There is a subcentimeter chronic infarct inferiorly in the left cerebellar hemisphere. Cerebral atrophy is mild-to-moderate for age with sulcal enlargement most notable in the parieto-occipital regions. Vascular: Major intracranial vascular flow voids are preserved. Skull and upper cervical spine: No suspicious marrow lesion. Advanced disc and facet degeneration in the cervical spine. Sinuses/Orbits: Bilateral cataract extraction. Paranasal sinuses and mastoid air cells are clear. Other: None. IMPRESSION: 1. No acute intracranial abnormality. 2. Moderately advanced chronic small vessel ischemic disease and mild-to-moderate cerebral atrophy. Electronically Signed   By: Logan Bores M.D.   On: 05/02/2019 17:21    Assessment & Plan:   There are no diagnoses linked to this encounter.   No orders of the defined types were placed in this encounter.    Follow-up: No follow-ups on file.  Walker Kehr, MD

## 2019-09-01 NOTE — Telephone Encounter (Signed)
New message    The patient is asking the CMA to call her back lost some papers

## 2019-09-01 NOTE — Assessment & Plan Note (Addendum)
Vague symptoms.  Thoracic spine x ray ordered Toradol injection 30 mg IM Gabapentin prescription provided

## 2019-09-02 NOTE — Telephone Encounter (Signed)
Pt lost discharge papers. Will put in the mail for her.  Also, Pt states that her back pain is persistent. Advised pt to continue taking medication that was giving over the weekend and follow back up on Monday if it has not gotten better

## 2019-09-04 ENCOUNTER — Encounter: Payer: Self-pay | Admitting: Internal Medicine

## 2019-09-04 DIAGNOSIS — M25512 Pain in left shoulder: Secondary | ICD-10-CM | POA: Insufficient documentation

## 2019-09-04 DIAGNOSIS — G8929 Other chronic pain: Secondary | ICD-10-CM | POA: Insufficient documentation

## 2019-09-04 NOTE — Assessment & Plan Note (Signed)
Increased risk of bleeding on Eliquis -the patient is aware.  Bilateral shoulder injections were performed.

## 2019-09-04 NOTE — Assessment & Plan Note (Signed)
BP Readings from Last 3 Encounters:  09/01/19 (!) 188/110  06/16/19 120/80  03/28/19 136/76  Todays blood pressure elevation is likely related to her pain and emotional distress.

## 2019-09-04 NOTE — Telephone Encounter (Signed)
Agree. Thanks

## 2019-09-04 NOTE — Assessment & Plan Note (Signed)
See procedure.  Both shoulders were injected.

## 2019-09-08 ENCOUNTER — Inpatient Hospital Stay: Payer: Medicare Other

## 2019-09-08 ENCOUNTER — Telehealth: Payer: Self-pay | Admitting: Oncology

## 2019-09-08 ENCOUNTER — Other Ambulatory Visit: Payer: Self-pay

## 2019-09-08 ENCOUNTER — Inpatient Hospital Stay: Payer: Medicare Other | Attending: Oncology | Admitting: Oncology

## 2019-09-08 VITALS — BP 150/67 | HR 68 | Temp 97.8°F | Resp 18 | Ht 65.5 in | Wt 166.2 lb

## 2019-09-08 DIAGNOSIS — C50912 Malignant neoplasm of unspecified site of left female breast: Secondary | ICD-10-CM

## 2019-09-08 DIAGNOSIS — C50412 Malignant neoplasm of upper-outer quadrant of left female breast: Secondary | ICD-10-CM

## 2019-09-08 DIAGNOSIS — E039 Hypothyroidism, unspecified: Secondary | ICD-10-CM | POA: Diagnosis not present

## 2019-09-08 DIAGNOSIS — I110 Hypertensive heart disease with heart failure: Secondary | ICD-10-CM | POA: Diagnosis not present

## 2019-09-08 DIAGNOSIS — Z79899 Other long term (current) drug therapy: Secondary | ICD-10-CM | POA: Insufficient documentation

## 2019-09-08 DIAGNOSIS — I5032 Chronic diastolic (congestive) heart failure: Secondary | ICD-10-CM | POA: Insufficient documentation

## 2019-09-08 DIAGNOSIS — Z170 Estrogen receptor positive status [ER+]: Secondary | ICD-10-CM | POA: Insufficient documentation

## 2019-09-08 DIAGNOSIS — Z7901 Long term (current) use of anticoagulants: Secondary | ICD-10-CM | POA: Insufficient documentation

## 2019-09-08 DIAGNOSIS — Z79811 Long term (current) use of aromatase inhibitors: Secondary | ICD-10-CM | POA: Diagnosis not present

## 2019-09-08 LAB — COMPREHENSIVE METABOLIC PANEL
ALT: 35 U/L (ref 0–44)
AST: 21 U/L (ref 15–41)
Albumin: 3.7 g/dL (ref 3.5–5.0)
Alkaline Phosphatase: 110 U/L (ref 38–126)
Anion gap: 10 (ref 5–15)
BUN: 30 mg/dL — ABNORMAL HIGH (ref 8–23)
CO2: 30 mmol/L (ref 22–32)
Calcium: 10.4 mg/dL — ABNORMAL HIGH (ref 8.9–10.3)
Chloride: 97 mmol/L — ABNORMAL LOW (ref 98–111)
Creatinine, Ser: 1.26 mg/dL — ABNORMAL HIGH (ref 0.44–1.00)
GFR calc Af Amer: 43 mL/min — ABNORMAL LOW (ref >60)
GFR calc non Af Amer: 37 mL/min — ABNORMAL LOW (ref >60)
Glucose, Bld: 74 mg/dL (ref 70–99)
Potassium: 3.5 mmol/L (ref 3.5–5.1)
Sodium: 137 mmol/L (ref 135–145)
Total Bilirubin: 0.8 mg/dL (ref 0.3–1.2)
Total Protein: 8.1 g/dL (ref 6.5–8.1)

## 2019-09-08 LAB — CBC WITH DIFFERENTIAL/PLATELET
Abs Immature Granulocytes: 0.07 10*3/uL (ref 0.00–0.07)
Basophils Absolute: 0 10*3/uL (ref 0.0–0.1)
Basophils Relative: 0 %
Eosinophils Absolute: 0.1 10*3/uL (ref 0.0–0.5)
Eosinophils Relative: 1 %
HCT: 43.8 % (ref 36.0–46.0)
Hemoglobin: 14.3 g/dL (ref 12.0–15.0)
Immature Granulocytes: 1 %
Lymphocytes Relative: 27 %
Lymphs Abs: 2.7 10*3/uL (ref 0.7–4.0)
MCH: 33.7 pg (ref 26.0–34.0)
MCHC: 32.6 g/dL (ref 30.0–36.0)
MCV: 103.3 fL — ABNORMAL HIGH (ref 80.0–100.0)
Monocytes Absolute: 1.4 10*3/uL — ABNORMAL HIGH (ref 0.1–1.0)
Monocytes Relative: 14 %
Neutro Abs: 5.6 10*3/uL (ref 1.7–7.7)
Neutrophils Relative %: 57 %
Platelets: 293 10*3/uL (ref 150–400)
RBC: 4.24 MIL/uL (ref 3.87–5.11)
RDW: 13.2 % (ref 11.5–15.5)
WBC: 9.9 10*3/uL (ref 4.0–10.5)
nRBC: 0.2 % (ref 0.0–0.2)

## 2019-09-08 MED ORDER — ANASTROZOLE 1 MG PO TABS
1.0000 mg | ORAL_TABLET | Freq: Every day | ORAL | 3 refills | Status: DC
Start: 2019-09-08 — End: 2020-06-06

## 2019-09-08 NOTE — Telephone Encounter (Signed)
Scheduled appts per 8/19 los. Gave pt a print out of AVS.  

## 2019-09-08 NOTE — Progress Notes (Signed)
[name]  [phone] Fax:([phone]) [phone]     ID: [name] DOB: 1926/01/19  MR#: [id number]  IOE#:[id number]  Patient Care Team: [name], MD as PCP - General Angelena Form Annita Brod, MD as PCP - Cardiology (Cardiology) Rolm Bookbinder, MD as Consulting Physician (General Surgery) Anedra Penafiel, Virgie Dad, MD as Consulting Physician (Oncology) Burnell Blanks, MD as Consulting Physician (Cardiology) Jarome Matin, MD as Consulting Physician (Dermatology) Marlou Sa Tonna Corner, MD as Consulting Physician (Orthopedic Surgery) Cameron Sprang, MD as Consulting Physician (Neurology) OTHER MD:  CHIEF COMPLAINT: Estrogen receptor positive invasive breast cancer  CURRENT TREATMENT: Anastrozole   INTERVAL HISTORY: [name] is seen today for follow-up of her estrogen receptor positive breast cancer.   She continues on anastrozole.  She tolerates this with no side effects that she is aware of.  The last mammogram we have records for on Ms. give it is from July 2019 at [facility] showing a density category A.  REVIEW OF SYSTEMS: [name] is still living independently at [facility] with her husband.  She has so much arthritis she can barely move she says.  She has some scalp issues that Dr. Jarome Matin has been treating and she is aggravated by those treatments and plans to let him know that next visit she says.  She fell several years ago and has been using a Rollator since and has had no further falls.  She has had her vaccine x2 with no side effects.  A detailed review of systems today was otherwise stable.   BREAST CANCER HISTORY: From the original intake note:  Larosa noted a change in her left breast and had bilateral screening mammography at [facility] 06/07/2015, with tomosynthesis. Breast density was category A. There was a new oval mass in the left breast central to the nipple. Left breast ultrasound the same day confirmed a 3.4 cm oval mass in the left  breast upper outer quadrant, correlating with the mammography. The left axilla was sonographically benign.  Biopsy of the left breast mass in question 06/11/2015 showed ([id number]) a papillary lesion, consistent with an intraductal papilloma, but due to fragmentation the edges of the lesion could not be assessed.  Accordingly the patient was referred to surgery, and after appropriate discussion underwent left lumpectomy 07/25/2015. The final pathology ([id number]) showed 2 foci of invasive ductal carcinoma, grade 2, measuring 3.0 cm, and grade 1, measuring 0.9 cm. There was also ductal carcinoma in situ. The invasive tumor was present at the superior margin, and ductal carcinoma in situ was focally less than 0.1 cm to the same margin. There was evidence of lymphovascular invasion. The prognostic panel showed estrogen receptor 100% positive, progesterone receptor 100% positive, both with strong staining intensity, for both lesions. HER-2 was also negative for both lesions, the signals ratio being 1.45-1.65, and the number per cell 2.10-2.80.  The patient's subsequent history is as detailed below   PAST MEDICAL HISTORY: Past Medical History:  Diagnosis Date  . Anxiety    denies  . Atrial flutter ([place])   . Bilateral edema of lower extremity   . Breast cancer ([place])    left breast  . Chronic constipation   . Chronic diastolic CHF (congestive heart failure) ([place])   . Diverticulosis of colon   . GERD (gastroesophageal reflux disease)   . H/O hiatal hernia   . History of cellulitis    LEFT LOWER LEG --  SEPT 2015  . History of colitis    DX  2003  WITH ISCHEMIC COLITIS  . History of diverticulitis of colon    perferated diverticulitis w/ abscess 08-25-2013 drain placed --  resolved without surgical intervention  . History of GI bleed    2011--- UPPER SECONARY GASTRIC ULCER FROM NSAID USE  . History of Helicobacter pylori infection    2011  . History of ulcer of lower limb    2012   RIGHT  LOWER EXTREMITIY--  STATSIS ULCER  . Hyperlipidemia   . Hypertension   . Hypothyroidism   . Iron deficiency anemia 2011   elevated MCV  . LBP (low back pain)    severe lumbar spondylosis s/p L3/L4,L4/L5 fusion 12/10  . Lumbar spondylolysis   . OA (osteoarthritis)    "hands" (08/25/2013)  . Open wound of left lower leg   . Osteopenia   . Persistent atrial fibrillation ([place])    CARDIOLOGIST-   DR MCALANY  . RBBB   . Urinary frequency     PAST SURGICAL HISTORY: Past Surgical History:  Procedure Laterality Date  . APPLICATION OF A-CELL OF EXTREMITY Left 06/21/2013   Procedure: APPLICATION OF A-CELL OF EXTREMITY;  Surgeon: Theodoro Kos, DO;  Location: [place];  Service: Plastics;  Laterality: Left;  . APPLICATION OF A-CELL OF EXTREMITY Left 08/22/2013   Procedure: APPLICATION OF A-CELL/VAC TO LOWER LEFT LEG WOUND;  Surgeon: Theodoro Kos, DO;  Location: [place];  Service: Plastics;  Laterality: Left;  . APPLICATION OF A-CELL OF EXTREMITY Left 02/06/2014   Procedure: WITH PLACEMENT OF A -CELL ;  Surgeon: Theodoro Kos, DO;  Location: [place];  Service: Plastics;  Laterality: Left;  . APPLICATION OF A-CELL OF EXTREMITY Left 08/09/2014   Procedure: PLACEMENT OF A CELL;  Surgeon: Theodoro Kos, DO;  Location: [place];  Service: Plastics;  Laterality: Left;  . APPLICATION OF WOUND VAC Left 06/16/2013   Procedure: APPLICATION OF WOUND VAC;  Surgeon: Meredith Pel, MD;  Location: [place];  Service: Orthopedics;  Laterality: Left;  . APPLICATION OF WOUND VAC Left 06/21/2013   Procedure: APPLICATION OF WOUND VAC;  Surgeon: Theodoro Kos, DO;  Location: [place];  Service: Plastics;  Laterality: Left;  . BIOPSY BREAST  07/25/2015   LEFT RADIOACTIVE SEED GUIDED EXCISIONAL BREAST BIOPSY (Left) as a surgical intervention  . EXTERNAL FIXATION LEG Left 06/16/2013   Procedure: EXTERNAL FIXATION LEG;  Surgeon: Meredith Pel, MD;  Location: [place];  Service: Orthopedics;   Laterality: Left;  . EXTERNAL FIXATION REMOVAL Left 06/21/2013   Procedure: REMOVAL EXTERNAL FIXATION LEG;  Surgeon: Meredith Pel, MD;  Location: [place];  Service: Orthopedics;  Laterality: Left;  . EYE SURGERY Bilateral    cataract removal  . HARDWARE REMOVAL Left 05/23/2014   Procedure: HARDWARE REMOVAL;  Surgeon: Meredith Pel, MD;  Location: [place];  Service: Orthopedics;  Laterality: Left;  REMOVAL OF HARDWARE LEFT TIBIA  . I & D EXTREMITY Left 06/16/2013   Procedure: IRRIGATION AND DEBRIDEMENT EXTREMITY;  Surgeon: Meredith Pel, MD;  Location: [place];  Service: Orthopedics;  Laterality: Left;  . I & D EXTREMITY Left 02/06/2014   Procedure: IRRIGATION AND DEBRIDEMENT LEFT LEG WOUND ;  Surgeon: Theodoro Kos, DO;  Location: [place];  Service: Plastics;  Laterality: Left;  . I & D EXTREMITY Left 08/09/2014   Procedure: IRRIGATION AND DEBRIDEMENT  OF LEFT LOWER LEG;  Surgeon: Theodoro Kos, DO;  Location: [place];  Service: Plastics;  Laterality: Left;  . LUMBAR  FUSION  01-02-2009   L3-L5  . RADIOACTIVE SEED GUIDED EXCISIONAL BREAST BIOPSY Left 07/25/2015   Procedure: LEFT RADIOACTIVE SEED GUIDED EXCISIONAL BREAST BIOPSY;  Surgeon: Rolm Bookbinder, MD;  Location: [place];  Service: General;  Laterality: Left;  . RE-EXCISION OF BREAST LUMPECTOMY Left 09/05/2015   Procedure: RE-EXCISION OF LEFT BREAST LUMPECTOMY;  Surgeon: Rolm Bookbinder, MD;  Location: [place];  Service: General;  Laterality: Left;  . SHOULDER OPEN ROTATOR CUFF REPAIR Left 1997  . TIBIA IM NAIL INSERTION Left 06/21/2013   Procedure: INTRAMEDULLARY (IM) NAIL TIBIAL;  Surgeon: Meredith Pel, MD;  Location: [place];  Service: Orthopedics;  Laterality: Left;  . TONSILLECTOMY  AS CHILD  . TOTAL HIP ARTHROPLASTY Right 06-07-2010  . TRANSTHORACIC ECHOCARDIOGRAM  09-11-2010   DR MCALHANY   LVSF  55-60%/  MILD MV CALCIFICATION WITH NO STENOSIS/  MILD MR & TR / MODERATE LAE/  MODERATE TO SEVERE RAE  . UPPER GI  ENDOSCOPY  03-29-2010    FAMILY HISTORY Family History  Problem Relation Age of Onset  . Cancer Father   . Hypertension Other   The patient's father died at the age of 26 from heart problems. The patient's mother died at the age of 36 with a ruptured aneurysm. The patient had no brothers, 1 sister. There is no history of breast or ovarian cancer in the family.    GYNECOLOGIC HISTORY:  No LMP recorded. Patient is postmenopausal. Menarche age 19, first live birth age 46. The patient is GX P2. She stopped having periods in her early 5s. She did not take hormone replacement. She used oral contraceptives remotely for approximately 2 years, with no complications.   SOCIAL HISTORY:  Aide used to work in [employer] but is now retired. Her husband Ernie Hew was a Airline pilot. They currently live at [facility]. Daughter Jerene Dilling lives in [place], near [place]. With her husband Jenny Reichmann. Daughter Pam lives in [place]. The patient has 2 grandchildren and 4 great-grandchildren. She is a Psychologist, forensic.   ADVANCED DIRECTIVES: The patient has a living will in place and she has named her granddaughter Almetta Lovely as her healthcare part of attorney. Lattie Haw can be reached at [place]: Social History   Tobacco Use  . Smoking status: Never Smoker  . Smokeless tobacco: Never Used  Vaping Use  . Vaping Use: Never used  Substance Use Topics  . Alcohol use: No    Comment: 1 glass occ wine 08/03/14- none in a year  . Drug use: No     Colonoscopy: Laurence Spates, remote  PAP:  Bone density:   Allergies  Allergen Reactions  . Zosyn [Piperacillin Sod-Tazobactam So] Hives, Itching, Rash and Other (See Comments)    Morbilliform eruptions with itching- looks like Measles  . Atenolol Nausea And Vomiting  . Clarithromycin Nausea And Vomiting  . Codeine Sulfate Nausea Only  . Levaquin [Levofloxacin] Nausea Only  . Macrodantin Other (See Comments)    UNSPECIFIED   .  Oxycodone-Acetaminophen Nausea And Vomiting  . Percocet [Oxycodone-Acetaminophen] Nausea And Vomiting    Current Outpatient Medications  Medication Sig Dispense Refill  . acetaminophen (TYLENOL) 500 MG tablet Take 500 mg by mouth daily as needed for mild pain.     [name] anastrozole (ARIMIDEX) 1 MG tablet Take 1 tablet (1 mg total) by mouth daily. (Patient taking differently: Take 1 mg by mouth at bedtime. ) 90 tablet 3  . apixaban (ELIQUIS) 5 MG TABS tablet Take 1 tablet (  5 mg total) by mouth 2 (two) times daily. 180 tablet 1  . atorvastatin (LIPITOR) 10 MG tablet Take 1 tablet (10 mg total) by mouth every evening. 90 tablet 3  . B Complex-C (B-COMPLEX WITH VITAMIN C) tablet Take 1 tablet by mouth daily.    . busPIRone (BUSPAR) 7.5 MG tablet Take 1 tablet (7.5 mg total) by mouth 3 (three) times daily. 270 tablet 1  . Cholecalciferol (VITAMIN D3) 1000 UNITS tablet Take 1,000 Units by mouth daily.      [name] diltiazem (CARDIZEM CD) 120 MG 24 hr capsule TAKE 1 CAPSULE (120 MG TOTAL) BY MOUTH DAILY. 90 capsule 3  . doxycycline (VIBRA-TABS) 100 MG tablet Take 1 tablet (100 mg total) by mouth 2 (two) times daily. 180 tablet 3  . gabapentin (NEURONTIN) 100 MG capsule Take 1 capsule (100 mg total) by mouth 3 (three) times daily as needed. Pain 90 capsule 3  . levothyroxine (SYNTHROID) 25 MCG tablet TAKE 1 TABLET BY MOUTH DAILY FOR THYROID. 30 tablet 11  . linaclotide (LINZESS) 290 MCG CAPS capsule Take 1 capsule (290 mcg total) by mouth daily. 90 capsule 3  . methylPREDNISolone (MEDROL DOSEPAK) 4 MG TBPK tablet As directed 21 tablet 0  . metoprolol succinate (TOPROL-XL) 50 MG 24 hr tablet Take 1 tablet (50 mg total) by mouth 2 (two) times daily. Take with or immediately following a meal. 180 tablet 3  . Multiple Vitamins-Minerals (MULTIVITAMIN WITH MINERALS) tablet Take 1 tablet by mouth daily.    . polyethylene glycol (MIRALAX) 17 g packet Take 17 g by mouth daily. 30 each 0  . temazepam (RESTORIL) 15 MG  capsule Take 1 capsule (15 mg total) by mouth at bedtime as needed for sleep. 90 capsule 1  . vitamin C (ASCORBIC ACID) 500 MG tablet Take 500 mg by mouth daily.     No current facility-administered medications for this visit.    OBJECTIVE: White woman who appears stated age  15:   09/08[birth year]19 1152  BP: (!) 150/67  Pulse: 68  Resp: 18  Temp: 97.8 F (36.6 C)  SpO2: 98%     Body mass index is 27.24 kg/m.     ECOG FS:1 - Symptomatic but completely ambulatory  Sclerae unicteric, EOMs intact Wearing a mask No cervical or supraclavicular adenopathy Lungs no rales or rhonchi Heart regular rate and rhythm Abd soft, nontender, positive bowel sounds MSK no focal spinal tenderness, no upper extremity lymphedema Neuro: nonfocal, well oriented, appropriate affect Breasts: The right breast is unremarkable.  The left breast has undergone lumpectomy with no evidence of local recurrence.  Both axillae are benign.   LAB RESULTS:  CMP     Component Value Date/Time   NA 137 06/16/2019 1550   NA 134 08/11/2018 1425   NA 137 07/28/2016 1334   K 3.7 06/16/2019 1550   K 3.8 07/28/2016 1334   CL 99 06/16/2019 1550   CO2 33 (H) 06/16/2019 1550   CO2 28 07/28/2016 1334   GLUCOSE 161 (H) 06/16/2019 1550   GLUCOSE 160 (H) 07/28/2016 1334   BUN 24 (H) 06/16/2019 1550   BUN 20 08/11/2018 1425   BUN 9.5 07/28/2016 1334   CREATININE 1.15 06/16/2019 1550   CREATININE 1.0 07/28/2016 1334   CALCIUM 10.1 06/16/2019 1550   CALCIUM 9.6 07/28/2016 1334   PROT 7.6 06/16/2019 1550   PROT 7.4 07/28/2016 1334   ALBUMIN 4.1 06/16/2019 1550   ALBUMIN 4.0 07/28/2016 1334   AST 20 06/16/2019 1550  AST 21 07/28/2016 1334   ALT 12 06/16/2019 1550   ALT 24 07/28/2016 1334   ALKPHOS 67 06/16/2019 1550   ALKPHOS 96 07/28/2016 1334   BILITOT 1.2 06/16/2019 1550   BILITOT 1.21 (H) 07/28/2016 1334   GFRNONAA 31 (L) 10/15/2018 1617   GFRAA 35 (L) 10/15/2018 1617    INo results found for: SPEP,  UPEP  Lab Results  Component Value Date   WBC 9.9 09/08/2019   NEUTROABS 5.6 09/08/2019   HGB 14.3 09/08/2019   HCT 43.8 09/08/2019   MCV 103.3 (H) 09/08/2019   PLT 293 09/08/2019      Chemistry      Component Value Date/Time   NA 137 06/16/2019 1550   NA 134 08/11/2018 1425   NA 137 07/28/2016 1334   K 3.7 06/16/2019 1550   K 3.8 07/28/2016 1334   CL 99 06/16/2019 1550   CO2 33 (H) 06/16/2019 1550   CO2 28 07/28/2016 1334   BUN 24 (H) 06/16/2019 1550   BUN 20 08/11/2018 1425   BUN 9.5 07/28/2016 1334   CREATININE 1.15 06/16/2019 1550   CREATININE 1.0 07/28/2016 1334      Component Value Date/Time   CALCIUM 10.1 06/16/2019 1550   CALCIUM 9.6 07/28/2016 1334   ALKPHOS 67 06/16/2019 1550   ALKPHOS 96 07/28/2016 1334   AST 20 06/16/2019 1550   AST 21 07/28/2016 1334   ALT 12 06/16/2019 1550   ALT 24 07/28/2016 1334   BILITOT 1.2 06/16/2019 1550   BILITOT 1.21 (H) 07/28/2016 1334       No results found for: LABCA2  No components found for: LABCA125  No results for input(s): INR in the last 168 hours.  Urinalysis    Component Value Date/Time   COLORURINE YELLOW 10/07/2018 1535   APPEARANCEUR CLEAR 10/07/2018 1535   LABSPEC 1.012 10/07/2018 1535   PHURINE 6.0 10/07/2018 1535   GLUCOSEU NEGATIVE 10/07/2018 1535   GLUCOSEU NEGATIVE 01/17/2015 1415   HGBUR NEGATIVE 10/07/2018 [place] 10/07/2018 1535   KETONESUR NEGATIVE 10/07/2018 1535   PROTEINUR 30 (A) 10/07/2018 1535   UROBILINOGEN 0.2 01/17/2015 1415   NITRITE NEGATIVE 10/07/2018 1535   LEUKOCYTESUR NEGATIVE 10/07/2018 1535     STUDIES: DG Thoracic Spine 2 View  Result Date: 09/02/2019 CLINICAL DATA:  Chronic dorsalgia EXAM: THORACIC SPINE 3 VIEWS COMPARISON:  None. FINDINGS: Frontal, lateral, and swimmer's views were obtained. Bones are diffusely osteoporotic. There is mild midthoracic dextroscoliosis. There is no fracture or spondylolisthesis. There is disc space narrowing at  multiple levels with multiple anterior and lateral osteophytes. No erosive change. There is aortic atherosclerosis. IMPRESSION: Bones osteoporotic. Multilevel arthropathy. No fracture or spondylolisthesis. Aortic Atherosclerosis (ICD10-I70.0). Electronically Signed   By: Lowella Grip III M.D.   On: 09/02/2019 08:48   DG Scapula Right  Result Date: 09/02/2019 CLINICAL DATA:  Pain EXAM: RIGHT SCAPULA - 2+ VIEWS COMPARISON:  None. FINDINGS: Frontal and lateral views were obtained. Bones are diffusely osteoporotic. No fracture or dislocation. There is mild narrowing of the glenohumeral joint. No erosive changes. Right lung clear. IMPRESSION: Bones osteoporotic. No fracture or dislocation. Mild narrowing glenohumeral joint. Electronically Signed   By: Lowella Grip III M.D.   On: 09/02/2019 08:46     ELIGIBLE FOR AVAILABLE RESEARCH PROTOCOL: no  ASSESSMENT: 86 y.o. [place] woman status post left breast upper outer quadrant biopsy 06/11/2015 for a papillary lesion with unclear margins.  (1) status post left lumpectomy 07/25/2015 for an mpT2 cN0,  stage IIA  invasive ductal carcinoma,  grade I-II  estrogen and progesterone receptor strongly positive, HER-2 not amplified   (a) additional surgery 09/05/2015 found some residual ductal carcinoma in situ but the margins were clear.  (2) no sentinel lymph node sampling or adjuvant radiation planned  (3) adjuvant anastrozole started 08/10/2015   PLAN:  [name] is now just over 4 years out from definitive surgery for her breast cancer with no evidence of disease recurrence.  This is very favorable.  She is tolerating anastrozole well.  The plan is to continue that a total of 5 years which means when she sees me next year she will "graduate".  She is due for mammography and have entered that order.  She knows to call for any other issue that may develop before the next visit  Total encounter time 20 minutes.*   Ramere Downs, Virgie Dad, MD   09/08/19 12:14 PM Medical Oncology and Hematology [facility] [place], [place] [address] Tel. [phone]    Fax. [phone]   I, Wilburn Mylar, am acting as scribe for Dr. Virgie Dad. Madia Carvell.  I, Lurline Del MD, have reviewed the above documentation for accuracy and completeness, and I agree with the above.   *Total Encounter Time as defined by the Centers for Medicare and Medicaid Services includes, in addition to the face-to-face time of a patient visit (documented in the note above) non-face-to-face time: obtaining and reviewing outside history, ordering and reviewing medications, tests or procedures, care coordination (communications with other health care professionals or caregivers) and documentation in the medical record.

## 2019-09-12 ENCOUNTER — Telehealth: Payer: Self-pay | Admitting: Internal Medicine

## 2019-09-12 NOTE — Telephone Encounter (Signed)
temazepam (RESTORIL) 15 MG capsule  [facility] [place], [place] - [address] Phone:  [phone]  Fax:  [phone]     Last appointment: 8.12.21 Next appointment: 8.25.21

## 2019-09-13 NOTE — Telephone Encounter (Signed)
Lorre Nick, can you get this to Dr. Camila Li, or assist with this request? Pharmacist is requesting this be filled  No later than Thursday at 1pm if possible.  Thanks!

## 2019-09-14 ENCOUNTER — Ambulatory Visit: Payer: Medicare Other | Admitting: Internal Medicine

## 2019-09-14 MED ORDER — TEMAZEPAM 15 MG PO CAPS: 15.0000 mg | ORAL_CAPSULE | Freq: Every evening | ORAL | 1 refills | Status: DC | PRN

## 2019-09-14 NOTE — Telephone Encounter (Signed)
See 09/14/19 med refill

## 2019-09-14 NOTE — Telephone Encounter (Signed)
Done. Thx.

## 2019-09-15 ENCOUNTER — Ambulatory Visit (INDEPENDENT_AMBULATORY_CARE_PROVIDER_SITE_OTHER): Payer: Medicare Other | Admitting: Internal Medicine

## 2019-09-15 ENCOUNTER — Encounter: Payer: Self-pay | Admitting: Internal Medicine

## 2019-09-15 ENCOUNTER — Other Ambulatory Visit: Payer: Self-pay

## 2019-09-15 DIAGNOSIS — G8929 Other chronic pain: Secondary | ICD-10-CM

## 2019-09-15 DIAGNOSIS — M25512 Pain in left shoulder: Secondary | ICD-10-CM | POA: Diagnosis not present

## 2019-09-15 DIAGNOSIS — M546 Pain in thoracic spine: Secondary | ICD-10-CM

## 2019-09-15 DIAGNOSIS — M25511 Pain in right shoulder: Secondary | ICD-10-CM | POA: Diagnosis not present

## 2019-09-15 DIAGNOSIS — I10 Essential (primary) hypertension: Secondary | ICD-10-CM | POA: Diagnosis not present

## 2019-09-15 MED ORDER — GABAPENTIN 100 MG PO CAPS: 100.0000 mg | ORAL_CAPSULE | Freq: Four times a day (QID) | ORAL | 3 refills | Status: DC

## 2019-09-15 MED ORDER — ATORVASTATIN CALCIUM 10 MG PO TABS
5.0000 mg | ORAL_TABLET | ORAL | 3 refills | Status: DC
Start: 2019-09-15 — End: 2019-11-28

## 2019-09-15 MED ORDER — BUSPIRONE HCL 15 MG PO TABS: 15.0000 mg | ORAL_TABLET | Freq: Two times a day (BID) | ORAL | 11 refills | Status: DC

## 2019-09-15 NOTE — Progress Notes (Signed)
Subjective:  Patient ID: [name], female    DOB: [birth year]  Age: 86 y.o. MRN: [id number]  CC: No chief complaint on file.   HPI [name] presents for shoulder pain, anxiety, dyslipidemia f/u  Outpatient Medications Prior to Visit  Medication Sig Dispense Refill  . acetaminophen (TYLENOL) 500 MG tablet Take 500 mg by mouth daily as needed for mild pain.     [name] anastrozole (ARIMIDEX) 1 MG tablet Take 1 tablet (1 mg total) by mouth daily. 90 tablet 3  . apixaban (ELIQUIS) 5 MG TABS tablet Take 1 tablet (5 mg total) by mouth 2 (two) times daily. 180 tablet 1  . atorvastatin (LIPITOR) 10 MG tablet Take 1 tablet (10 mg total) by mouth every evening. 90 tablet 3  . B Complex-C (B-COMPLEX WITH VITAMIN C) tablet Take 1 tablet by mouth daily.    . busPIRone (BUSPAR) 7.5 MG tablet Take 1 tablet (7.5 mg total) by mouth 3 (three) times daily. 270 tablet 1  . Cholecalciferol (VITAMIN D3) 1000 UNITS tablet Take 1,000 Units by mouth daily.      [name] diltiazem (CARDIZEM CD) 120 MG 24 hr capsule TAKE 1 CAPSULE (120 MG TOTAL) BY MOUTH DAILY. 90 capsule 3  . doxycycline (VIBRA-TABS) 100 MG tablet Take 1 tablet (100 mg total) by mouth 2 (two) times daily. 180 tablet 3  . gabapentin (NEURONTIN) 100 MG capsule Take 1 capsule (100 mg total) by mouth 3 (three) times daily as needed. Pain 90 capsule 3  . levothyroxine (SYNTHROID) 25 MCG tablet TAKE 1 TABLET BY MOUTH DAILY FOR THYROID. 30 tablet 11  . linaclotide (LINZESS) 290 MCG CAPS capsule Take 1 capsule (290 mcg total) by mouth daily. 90 capsule 3  . methylPREDNISolone (MEDROL DOSEPAK) 4 MG TBPK tablet As directed 21 tablet 0  . metoprolol succinate (TOPROL-XL) 50 MG 24 hr tablet Take 1 tablet (50 mg total) by mouth 2 (two) times daily. Take with or immediately following a meal. 180 tablet 3  . Multiple Vitamins-Minerals (MULTIVITAMIN WITH MINERALS) tablet Take 1 tablet by mouth daily.    . polyethylene glycol (MIRALAX) 17 g packet Take 17 g by mouth  daily. 30 each 0  . temazepam (RESTORIL) 15 MG capsule Take 1 capsule (15 mg total) by mouth at bedtime as needed for sleep. 90 capsule 1  . vitamin C (ASCORBIC ACID) 500 MG tablet Take 500 mg by mouth daily.     No facility-administered medications prior to visit.    ROS: Review of Systems  Constitutional: Positive for fatigue. Negative for activity change, appetite change, chills and unexpected weight change.  HENT: Negative for congestion, mouth sores and sinus pressure.   Eyes: Negative for visual disturbance.  Respiratory: Negative for cough and chest tightness.   Gastrointestinal: Negative for abdominal pain and nausea.  Genitourinary: Negative for difficulty urinating, frequency and vaginal pain.  Musculoskeletal: Positive for arthralgias, back pain and gait problem.  Skin: Negative for pallor and rash.  Neurological: Negative for dizziness, tremors, weakness, numbness and headaches.  Psychiatric/Behavioral: Positive for sleep disturbance. Negative for confusion, decreased concentration and suicidal ideas. The patient is not nervous/anxious.     Objective:  BP (!) 152/80 (BP Location: Left Arm, Patient Position: Sitting, Cuff Size: Large)   Pulse 60   Temp 98 F (36.7 C) (Oral)   Ht 5' 5.5" (1.664 m)   Wt 171 lb (77.6 kg)   SpO2 97%   BMI 28.02 kg/m   BP Readings from Last  3 Encounters:  09/15/19 (!) 152/80  09/08/19 (!) 150/67  09/01/19 (!) 188/110    Wt Readings from Last 3 Encounters:  09/15/19 171 lb (77.6 kg)  09/08/19 166 lb 3.2 oz (75.4 kg)  09/01/19 172 lb (78 kg)    Physical Exam Constitutional:      General: She is not in acute distress.    Appearance: She is well-developed. She is obese.  HENT:     Head: Normocephalic.     Right Ear: External ear normal.     Left Ear: External ear normal.     Nose: Nose normal.  Eyes:     General:        Right eye: No discharge.        Left eye: No discharge.     Conjunctiva/sclera: Conjunctivae normal.      Pupils: Pupils are equal, round, and reactive to light.  Neck:     Thyroid: No thyromegaly.     Vascular: No JVD.     Trachea: No tracheal deviation.  Cardiovascular:     Rate and Rhythm: Normal rate and regular rhythm.     Heart sounds: Normal heart sounds.  Pulmonary:     Effort: No respiratory distress.     Breath sounds: No stridor. No wheezing.  Abdominal:     General: Bowel sounds are normal. There is no distension.     Palpations: Abdomen is soft. There is no mass.     Tenderness: There is no abdominal tenderness. There is no guarding or rebound.  Musculoskeletal:        General: No tenderness.     Cervical back: Normal range of motion and neck supple.  Lymphadenopathy:     Cervical: No cervical adenopathy.  Skin:    Findings: No erythema or rash.  Neurological:     Mental Status: She is oriented to person, place, and time.     Cranial Nerves: No cranial nerve deficit.     Motor: No abnormal muscle tone.     Coordination: Coordination abnormal.     Gait: Gait abnormal.     Deep Tendon Reflexes: Reflexes normal.  Psychiatric:        Behavior: Behavior normal.        Thought Content: Thought content normal.        Judgment: Judgment normal.    Walker Shoulders are better   Lab Results  Component Value Date   WBC 9.9 09/08/2019   HGB 14.3 09/08/2019   HCT 43.8 09/08/2019   PLT 293 09/08/2019   GLUCOSE 74 09/08/2019   CHOL 116 06/25/2012   TRIG 82 09/05/2013   HDL 41.50 06/25/2012   LDLDIRECT 110.3 05/24/2008   LDLCALC 66 06/25/2012   ALT 35 09/08/2019   AST 21 09/08/2019   NA 137 09/08/2019   K 3.5 09/08/2019   CL 97 (L) 09/08/2019   CREATININE 1.26 (H) 09/08/2019   BUN 30 (H) 09/08/2019   CO2 30 09/08/2019   TSH 0.66 06/16/2019   INR 1.33 05/23/2017   HGBA1C 5.9 (H) 10/07/2018    MR BRAIN WO CONTRAST  Result Date: 05/02/2019 CLINICAL DATA:  Transient alteration of awareness. Cognitive decline. EXAM: MRI HEAD WITHOUT CONTRAST TECHNIQUE:  Multiplanar, multiecho pulse sequences of the brain and surrounding structures were obtained without intravenous contrast. COMPARISON:  None. FINDINGS: Brain: There is no evidence of acute infarct, mass, midline shift, or extra-axial fluid collection. Small foci of susceptibility artifact in both thalami are compatible with chronic microhemorrhages, likely  related to chronic hypertension. Patchy to confluent T2 hyperintensities in the cerebral white matter bilaterally are nonspecific but compatible with moderately advanced chronic small vessel ischemic disease. Milder chronic small vessel changes are present in the pons. There is a subcentimeter chronic infarct inferiorly in the left cerebellar hemisphere. Cerebral atrophy is mild-to-moderate for age with sulcal enlargement most notable in the parieto-occipital regions. Vascular: Major intracranial vascular flow voids are preserved. Skull and upper cervical spine: No suspicious marrow lesion. Advanced disc and facet degeneration in the cervical spine. Sinuses/Orbits: Bilateral cataract extraction. Paranasal sinuses and mastoid air cells are clear. Other: None. IMPRESSION: 1. No acute intracranial abnormality. 2. Moderately advanced chronic small vessel ischemic disease and mild-to-moderate cerebral atrophy. Electronically Signed   By: Logan Bores M.D.   On: 05/02/2019 17:21    Assessment & Plan:    Walker Kehr, MD

## 2019-09-20 NOTE — Assessment & Plan Note (Signed)
Better Cont Gabapentin

## 2019-09-20 NOTE — Assessment & Plan Note (Addendum)
BP Readings from Last 3 Encounters:  09/15/19 (!) 152/80  09/08/19 (!) 150/67  09/01/19 (!) 188/110  check BP at home

## 2019-09-22 ENCOUNTER — Telehealth: Payer: Self-pay | Admitting: Internal Medicine

## 2019-09-22 NOTE — Telephone Encounter (Signed)
    Fritzie from [employer] calling to report patient has been having back pain since Saturday after lifting a box. Can order be written for a mobile xray?    Phone [phone] Fax [phone]

## 2019-09-28 ENCOUNTER — Ambulatory Visit (INDEPENDENT_AMBULATORY_CARE_PROVIDER_SITE_OTHER): Payer: Medicare Other

## 2019-09-28 ENCOUNTER — Other Ambulatory Visit: Payer: Self-pay

## 2019-09-28 ENCOUNTER — Ambulatory Visit (INDEPENDENT_AMBULATORY_CARE_PROVIDER_SITE_OTHER): Payer: Medicare Other | Admitting: Internal Medicine

## 2019-09-28 ENCOUNTER — Encounter: Payer: Self-pay | Admitting: Internal Medicine

## 2019-09-28 VITALS — BP 112/68 | HR 85 | Temp 97.8°F | Resp 16 | Ht 65.5 in | Wt 159.0 lb

## 2019-09-28 DIAGNOSIS — M5442 Lumbago with sciatica, left side: Secondary | ICD-10-CM | POA: Diagnosis not present

## 2019-09-28 DIAGNOSIS — M5441 Lumbago with sciatica, right side: Secondary | ICD-10-CM | POA: Diagnosis not present

## 2019-09-28 DIAGNOSIS — Z23 Encounter for immunization: Secondary | ICD-10-CM | POA: Diagnosis not present

## 2019-09-28 DIAGNOSIS — M545 Low back pain: Secondary | ICD-10-CM | POA: Diagnosis not present

## 2019-09-28 DIAGNOSIS — S22080A Wedge compression fracture of T11-T12 vertebra, initial encounter for closed fracture: Secondary | ICD-10-CM

## 2019-09-28 MED ORDER — HYDROCODONE-ACETAMINOPHEN 10-325 MG PO TABS: 1.0000 | ORAL_TABLET | Freq: Four times a day (QID) | ORAL | 0 refills | Status: DC | PRN

## 2019-09-28 MED ORDER — PROMETHAZINE HCL 12.5 MG PO TABS: 12.5000 mg | ORAL_TABLET | Freq: Four times a day (QID) | ORAL | 1 refills | Status: DC | PRN

## 2019-09-28 NOTE — Progress Notes (Signed)
Subjective:  Patient ID: [name], female    DOB: 25-Apr-1925  Age: 86 y.o. MRN: [id number]  CC: Back Pain  This visit occurred during the SARS-CoV-2 public health emergency.  Safety protocols were in place, including screening questions prior to the visit, additional usage of staff PPE, and extensive cleaning of exam room while observing appropriate contact time as indicated for disinfecting solutions.    HPI KIMBERLLY NORGARD presents for concerns about a 2 week hx of LBP.  She lifted something heavy about 2 weeks ago and developed the acute onset of nonradiating low back pain.  She is not gotten much symptom relief with gabapentin.  Outpatient Medications Prior to Visit  Medication Sig Dispense Refill  . acetaminophen (TYLENOL) 500 MG tablet Take 500 mg by mouth daily as needed for mild pain.     [name] anastrozole (ARIMIDEX) 1 MG tablet Take 1 tablet (1 mg total) by mouth daily. 90 tablet 3  . apixaban (ELIQUIS) 5 MG TABS tablet Take 1 tablet (5 mg total) by mouth 2 (two) times daily. 180 tablet 1  . atorvastatin (LIPITOR) 10 MG tablet Take 0.5 tablets (5 mg total) by mouth every other day. 90 tablet 3  . B Complex-C (B-COMPLEX WITH VITAMIN C) tablet Take 1 tablet by mouth daily.    . busPIRone (BUSPAR) 15 MG tablet Take 1 tablet (15 mg total) by mouth 2 (two) times daily. 60 tablet 11  . Cholecalciferol (VITAMIN D3) 1000 UNITS tablet Take 1,000 Units by mouth daily.      [name] diltiazem (CARDIZEM CD) 120 MG 24 hr capsule TAKE 1 CAPSULE (120 MG TOTAL) BY MOUTH DAILY. 90 capsule 3  . doxycycline (VIBRA-TABS) 100 MG tablet Take 1 tablet (100 mg total) by mouth 2 (two) times daily. 180 tablet 3  . gabapentin (NEURONTIN) 100 MG capsule Take 1 capsule (100 mg total) by mouth 4 (four) times daily. Pain 120 capsule 3  . levothyroxine (SYNTHROID) 25 MCG tablet TAKE 1 TABLET BY MOUTH DAILY FOR THYROID. 30 tablet 11  . linaclotide (LINZESS) 290 MCG CAPS capsule Take 1 capsule (290 mcg total) by mouth  daily. 90 capsule 3  . methylPREDNISolone (MEDROL DOSEPAK) 4 MG TBPK tablet As directed 21 tablet 0  . metoprolol succinate (TOPROL-XL) 50 MG 24 hr tablet Take 1 tablet (50 mg total) by mouth 2 (two) times daily. Take with or immediately following a meal. 180 tablet 3  . Multiple Vitamins-Minerals (MULTIVITAMIN WITH MINERALS) tablet Take 1 tablet by mouth daily.    . polyethylene glycol (MIRALAX) 17 g packet Take 17 g by mouth daily. 30 each 0  . temazepam (RESTORIL) 15 MG capsule Take 1 capsule (15 mg total) by mouth at bedtime as needed for sleep. 90 capsule 1  . vitamin C (ASCORBIC ACID) 500 MG tablet Take 500 mg by mouth daily.     No facility-administered medications prior to visit.    ROS Review of Systems  Constitutional: Negative.  Negative for diaphoresis and fatigue.  HENT: Negative.   Eyes: Negative.   Respiratory: Negative for cough, chest tightness, shortness of breath and wheezing.   Cardiovascular: Negative for chest pain, palpitations and leg swelling.  Gastrointestinal: Positive for constipation. Negative for abdominal pain, diarrhea and nausea.  Endocrine: Negative.   Genitourinary: Negative.  Negative for difficulty urinating.  Musculoskeletal: Positive for back pain. Negative for myalgias.  Skin: Negative for rash.  Neurological: Negative.  Negative for dizziness, weakness, light-headedness and numbness.  Hematological: Negative for  adenopathy. Does not bruise/bleed easily.  Psychiatric/Behavioral: Negative.     Objective:  BP 112/68   Pulse 85   Temp 97.8 F (36.6 C) (Oral)   Resp 16   Ht 5' 5.5" (1.664 m)   Wt 159 lb (72.1 kg)   SpO2 96%   BMI 26.06 kg/m   BP Readings from Last 3 Encounters:  09/28/19 112/68  09/15/19 (!) 152/80  09/08/19 (!) 150/67    Wt Readings from Last 3 Encounters:  09/28/19 159 lb (72.1 kg)  09/15/19 171 lb (77.6 kg)  09/08/19 166 lb 3.2 oz (75.4 kg)    Physical Exam Constitutional:      General: She is not in acute  distress.    Appearance: She is obese. She is ill-appearing (in a wheelchair). She is not toxic-appearing or diaphoretic.  HENT:     Nose: Nose normal.     Mouth/Throat:     Mouth: Mucous membranes are moist.  Eyes:     General: No scleral icterus.    Conjunctiva/sclera: Conjunctivae normal.  Cardiovascular:     Rate and Rhythm: Normal rate and regular rhythm.     Heart sounds: No murmur heard.   Pulmonary:     Effort: Pulmonary effort is normal.     Breath sounds: No stridor. No wheezing, rhonchi or rales.  Abdominal:     General: Abdomen is flat.     Palpations: There is no mass.     Tenderness: There is no abdominal tenderness. There is no guarding.  Musculoskeletal:     Cervical back: Neck supple.     Thoracic back: Tenderness and bony tenderness present. Decreased range of motion.     Lumbar back: Bony tenderness present. No edema, deformity, signs of trauma or tenderness. Decreased range of motion. Positive right straight leg raise test and positive left straight leg raise test.  Lymphadenopathy:     Cervical: No cervical adenopathy.  Skin:    General: Skin is warm and dry.  Neurological:     General: No focal deficit present.     Cranial Nerves: Cranial nerves are intact.     Sensory: Sensation is intact.     Motor: Weakness present.     Coordination: Coordination is intact.     Gait: Gait abnormal.     Deep Tendon Reflexes:     Reflex Scores:      Brachioradialis reflexes are 1+ on the right side and 1+ on the left side.      Patellar reflexes are 1+ on the right side and 1+ on the left side.      Achilles reflexes are 1+ on the right side and 1+ on the left side.    Comments: Diffuse BLE wekaness     Lab Results  Component Value Date   WBC 9.9 09/08/2019   HGB 14.3 09/08/2019   HCT 43.8 09/08/2019   PLT 293 09/08/2019   GLUCOSE 74 09/08/2019   CHOL 116 06/25/2012   TRIG 82 09/05/2013   HDL 41.50 06/25/2012   LDLDIRECT 110.3 05/24/2008   LDLCALC 66  06/25/2012   ALT 35 09/08/2019   AST 21 09/08/2019   NA 137 09/08/2019   K 3.5 09/08/2019   CL 97 (L) 09/08/2019   CREATININE 1.26 (H) 09/08/2019   BUN 30 (H) 09/08/2019   CO2 30 09/08/2019   TSH 0.66 06/16/2019   INR 1.33 05/23/2017   HGBA1C 5.9 (H) 10/07/2018    MR BRAIN WO CONTRAST  Result Date: 05/02/2019  CLINICAL DATA:  Transient alteration of awareness. Cognitive decline. EXAM: MRI HEAD WITHOUT CONTRAST TECHNIQUE: Multiplanar, multiecho pulse sequences of the brain and surrounding structures were obtained without intravenous contrast. COMPARISON:  None. FINDINGS: Brain: There is no evidence of acute infarct, mass, midline shift, or extra-axial fluid collection. Small foci of susceptibility artifact in both thalami are compatible with chronic microhemorrhages, likely related to chronic hypertension. Patchy to confluent T2 hyperintensities in the cerebral white matter bilaterally are nonspecific but compatible with moderately advanced chronic small vessel ischemic disease. Milder chronic small vessel changes are present in the pons. There is a subcentimeter chronic infarct inferiorly in the left cerebellar hemisphere. Cerebral atrophy is mild-to-moderate for age with sulcal enlargement most notable in the parieto-occipital regions. Vascular: Major intracranial vascular flow voids are preserved. Skull and upper cervical spine: No suspicious marrow lesion. Advanced disc and facet degeneration in the cervical spine. Sinuses/Orbits: Bilateral cataract extraction. Paranasal sinuses and mastoid air cells are clear. Other: None. IMPRESSION: 1. No acute intracranial abnormality. 2. Moderately advanced chronic small vessel ischemic disease and mild-to-moderate cerebral atrophy. Electronically Signed   By: Logan Bores M.D.   On: 05/02/2019 17:21   DG Lumbar Spine Complete  Result Date: 09/28/2019 CLINICAL DATA:  Acute low back pain. EXAM: LUMBAR SPINE - COMPLETE 4+ VIEW COMPARISON:  April 03, 2010.  FINDINGS: Status post surgical fusion of the L3-4 and L4-5 disc spaces. No acute fracture or spondylolisthesis is noted. Probable old compression fracture of T12 vertebral body is noted. Severe degenerative disc disease is noted at L5-S1. IMPRESSION: Status post surgical fusion of L3-4 and L4-5. Severe degenerative disc disease is noted at L5-S1. No acute abnormality seen in the lumbar spine. Aortic Atherosclerosis (ICD10-I70.0). Electronically Signed   By: Marijo Conception M.D.   On: 09/28/2019 14:00    Assessment & Plan:   Ambry was seen today for back pain.  Diagnoses and all orders for this visit:  Acute bilateral low back pain with bilateral sciatica- Based on her symptoms, exam, and plain films she has low back pain related to T12 vertebral fracture and worsening degenerative disc disease.  She appears to be neurologically intact.  She has a history of not tolerating oxycodone so I recommended that she take hydrocodone with acetaminophen and if she develops any nausea vomiting she should take promethazine as needed.  If the pain does not improve quickly then we will refer her for possible kyphoplasty. -     DG Lumbar Spine Complete; Future -     HYDROcodone-acetaminophen (NORCO) 10-325 MG tablet; Take 1 tablet by mouth every 6 (six) hours as needed. -     promethazine (PHENERGAN) 12.5 MG tablet; Take 1 tablet (12.5 mg total) by mouth every 6 (six) hours as needed for nausea or vomiting.  Compression fracture of T12 vertebra, initial encounter ([place])- See above. -     HYDROcodone-acetaminophen (NORCO) 10-325 MG tablet; Take 1 tablet by mouth every 6 (six) hours as needed. -     promethazine (PHENERGAN) 12.5 MG tablet; Take 1 tablet (12.5 mg total) by mouth every 6 (six) hours as needed for nausea or vomiting.  Other orders -     Flu Vaccine QUAD 6+ mos PF IM (Fluarix Quad PF)   I am having Santa Lighter Doubleday start on HYDROcodone-acetaminophen and promethazine. I am also having her maintain her  cholecalciferol, B-complex with vitamin C, acetaminophen, vitamin C, multivitamin with minerals, polyethylene glycol, linaclotide, diltiazem, metoprolol succinate, doxycycline, apixaban, levothyroxine, methylPREDNISolone, anastrozole, temazepam, gabapentin,  busPIRone, and atorvastatin.  Meds ordered this encounter  Medications  . HYDROcodone-acetaminophen (NORCO) 10-325 MG tablet    Sig: Take 1 tablet by mouth every 6 (six) hours as needed.    Dispense:  65 tablet    Refill:  0  . promethazine (PHENERGAN) 12.5 MG tablet    Sig: Take 1 tablet (12.5 mg total) by mouth every 6 (six) hours as needed for nausea or vomiting.    Dispense:  65 tablet    Refill:  1     Follow-up: Return if symptoms worsen or fail to improve.  Scarlette Calico, MD

## 2019-09-28 NOTE — Telephone Encounter (Signed)
Ok w/me - what part of the back? Thx

## 2019-09-28 NOTE — Telephone Encounter (Signed)
Pt is coming in today to Dr. Ronnald Ramp in regards to back pains

## 2019-09-28 NOTE — Patient Instructions (Signed)
Lumbar Spine Fracture A lumbar spine fracture is a break in one of the bones of the lower back. Lumbar spine fractures can vary from mild to severe. The most severe types are those that:  Cause the broken bones to move out of place (unstable).  Injure or press on the spinal cord. During recovery, it is normal to have pain and stiffness in the lower back for weeks. What are the causes? This condition may be caused by:  A fall.  A car accident.  A gunshot wound.  A hard, direct hit to the back. What increases the risk? You are more likely to develop this condition if:  You are in a situation that could result in a fall or other violent injury.  You have a condition that causes weakness in the bones (osteoporosis). What are the signs or symptoms? The main symptom of this condition is severe pain in the lower back. If a fracture is complex or severe, there may also be:  A misshapen or swollen area on the lower back.  Limited ability to move an area of the lower back.  Inability to empty the bladder (urinary retention).  Loss of bowel or bladder control (incontinence).  Loss of strength or sensation in the legs, feet, and toes.  Inability to move (paralysis). How is this diagnosed? This condition is diagnosed based on:  A physical exam.  Symptoms and what happened just before they developed.  The results of imaging tests, such as an X-ray, CT scan, or MRI. If your nerves have been damaged, you may also have other tests to find out the extent of the damage. How is this treated? Treatment for this condition depends on how severe the injury is. Most fractures can be treated with:  A back brace.  Bed rest and activity restrictions.  Pain medicine.  Physical therapy. Fractures that are complex, involve multiple bones, or make the spine unstable may require surgery. Surgery is done:  To remove pressure from the nerves or spinal cord.  To stabilize the broken pieces of  bone. Follow these instructions at home: Medicines  Take over-the-counter and prescription medicines only as told by your health care provider.  Do not drive or use heavy machinery while taking prescription pain medicine.  If you are taking prescription pain medicine, take actions to prevent or treat constipation. Your health care provider may recommend that you: ? Drink enough fluid to keep your urine pale yellow. ? Eat foods that are high in fiber, such as fresh fruits and vegetables, whole grains, and beans. ? Limit foods that are high in fat and processed sugars, such as fried or sweet foods. ? Take an over-the-counter or prescription medicine for constipation. If you have a brace:  Wear the back brace as told by your health care provider. Remove it only as told by your health care provider.  Keep the brace clean.  If the brace is not waterproof: ? Do not let it get wet. ? Cover it with a watertight covering when you take a bath or a shower. Activity  Stay in bed (on bed rest) only as directed by your health care provider.  Do exercises to improve motion and strength in your back (physical therapy), if your health care provider tells you to do so.  Return to your normal activities as directed by your health care provider. Ask your health care provider what activities are safe for you. Managing pain, stiffness, and swelling   If directed, put ice  on the injured area: ? If you have a removable brace, remove it as told by your health care provider. ? Put ice in a plastic bag. ? Place a towel between your skin and the bag. ? Leave the ice on for 20 minutes, 2-3 times a day. General instructions  Do not use any products that contain nicotine or tobacco, such as cigarettes and e-cigarettes. These can delay healing after injury. If you need help quitting, ask your health care provider.  Do not drink alcohol. Alcohol can interfere with your treatment.  Keep all follow-up visits  as directed by your health care provider. This is important. ? Failing to follow up as recommended could result in permanent injury, disability, or long-lasting (chronic) pain. Contact a health care provider if:  You have a fever.  Your pain medicine is not helping.  Your pain does not get better over time.  You cannot return to your normal activities as planned or expected. Get help right away if:  You have difficulty breathing.  Your pain is very bad and it suddenly gets worse.  You have numbness, tingling, or weakness in any part of your body.  You are unable to empty your bladder.  You cannot control your bladder or bowels.  You are unable to move any body part (paralysis) that is below the level of your injury.  You vomit.  You have pain in your abdomen. Summary  A lumbar spine fracture is a break in one of the bones of the lower back.  The main symptom of this condition is severe pain in the lower back. If a fracture is complex, there may also be numbness, tingling, or paralysis in the legs.  Treatment depends on how severe the injury is. Most fractures can be treated with a back brace, bed rest and activity restrictions, pain medicine, and physical therapy.  Fractures that are complex, involve multiple bones, or make the spine unstable may require surgery. This information is not intended to replace advice given to you by your health care provider. Make sure you discuss any questions you have with your health care provider. Document Revised: 02/21/2017 Document Reviewed: 02/21/2017 Elsevier Patient Education  2020 [employer]  

## 2019-09-29 NOTE — Telephone Encounter (Signed)
OK. Thx

## 2019-10-03 ENCOUNTER — Emergency Department (HOSPITAL_COMMUNITY): Payer: Medicare Other

## 2019-10-03 ENCOUNTER — Other Ambulatory Visit: Payer: Self-pay

## 2019-10-03 ENCOUNTER — Emergency Department (HOSPITAL_COMMUNITY)
Admission: EM | Admit: 2019-10-03 | Discharge: 2019-10-04 | Disposition: A | Discharge status: Home / Self Care | Payer: Medicare Other | Attending: Emergency Medicine | Admitting: Emergency Medicine

## 2019-10-03 ENCOUNTER — Telehealth: Payer: Self-pay | Admitting: Internal Medicine

## 2019-10-03 ENCOUNTER — Encounter (HOSPITAL_COMMUNITY): Payer: Self-pay | Admitting: Emergency Medicine

## 2019-10-03 DIAGNOSIS — I5032 Chronic diastolic (congestive) heart failure: Secondary | ICD-10-CM | POA: Insufficient documentation

## 2019-10-03 DIAGNOSIS — Z853 Personal history of malignant neoplasm of breast: Secondary | ICD-10-CM | POA: Insufficient documentation

## 2019-10-03 DIAGNOSIS — X500XXA Overexertion from strenuous movement or load, initial encounter: Secondary | ICD-10-CM | POA: Insufficient documentation

## 2019-10-03 DIAGNOSIS — Z20822 Contact with and (suspected) exposure to covid-19: Secondary | ICD-10-CM | POA: Diagnosis not present

## 2019-10-03 DIAGNOSIS — Z7901 Long term (current) use of anticoagulants: Secondary | ICD-10-CM | POA: Diagnosis not present

## 2019-10-03 DIAGNOSIS — I110 Hypertensive heart disease with heart failure: Secondary | ICD-10-CM | POA: Insufficient documentation

## 2019-10-03 DIAGNOSIS — S32020G Wedge compression fracture of second lumbar vertebra, subsequent encounter for fracture with delayed healing: Secondary | ICD-10-CM | POA: Insufficient documentation

## 2019-10-03 DIAGNOSIS — S22088A Other fracture of T11-T12 vertebra, initial encounter for closed fracture: Secondary | ICD-10-CM | POA: Diagnosis not present

## 2019-10-03 DIAGNOSIS — M810 Age-related osteoporosis without current pathological fracture: Secondary | ICD-10-CM | POA: Diagnosis not present

## 2019-10-03 DIAGNOSIS — Z79899 Other long term (current) drug therapy: Secondary | ICD-10-CM | POA: Diagnosis not present

## 2019-10-03 DIAGNOSIS — Z96641 Presence of right artificial hip joint: Secondary | ICD-10-CM | POA: Insufficient documentation

## 2019-10-03 DIAGNOSIS — M25551 Pain in right hip: Secondary | ICD-10-CM | POA: Insufficient documentation

## 2019-10-03 DIAGNOSIS — S22050A Wedge compression fracture of T5-T6 vertebra, initial encounter for closed fracture: Secondary | ICD-10-CM | POA: Diagnosis not present

## 2019-10-03 DIAGNOSIS — E039 Hypothyroidism, unspecified: Secondary | ICD-10-CM | POA: Diagnosis not present

## 2019-10-03 DIAGNOSIS — S299XXA Unspecified injury of thorax, initial encounter: Secondary | ICD-10-CM | POA: Diagnosis present

## 2019-10-03 DIAGNOSIS — Z79890 Hormone replacement therapy (postmenopausal): Secondary | ICD-10-CM | POA: Insufficient documentation

## 2019-10-03 DIAGNOSIS — S32020A Wedge compression fracture of second lumbar vertebra, initial encounter for closed fracture: Secondary | ICD-10-CM | POA: Diagnosis not present

## 2019-10-03 DIAGNOSIS — R52 Pain, unspecified: Secondary | ICD-10-CM | POA: Diagnosis not present

## 2019-10-03 DIAGNOSIS — S22080A Wedge compression fracture of T11-T12 vertebra, initial encounter for closed fracture: Secondary | ICD-10-CM | POA: Diagnosis not present

## 2019-10-03 DIAGNOSIS — M5489 Other dorsalgia: Secondary | ICD-10-CM | POA: Diagnosis not present

## 2019-10-03 LAB — CBC WITH DIFFERENTIAL/PLATELET
Abs Immature Granulocytes: 0.03 10*3/uL (ref 0.00–0.07)
Basophils Absolute: 0.1 10*3/uL (ref 0.0–0.1)
Basophils Relative: 1 %
Eosinophils Absolute: 0 10*3/uL (ref 0.0–0.5)
Eosinophils Relative: 0 %
HCT: 43.9 % (ref 36.0–46.0)
Hemoglobin: 14.3 g/dL (ref 12.0–15.0)
Immature Granulocytes: 0 %
Lymphocytes Relative: 14 %
Lymphs Abs: 1.1 10*3/uL (ref 0.7–4.0)
MCH: 33.7 pg (ref 26.0–34.0)
MCHC: 32.6 g/dL (ref 30.0–36.0)
MCV: 103.5 fL — ABNORMAL HIGH (ref 80.0–100.0)
Monocytes Absolute: 0.7 10*3/uL (ref 0.1–1.0)
Monocytes Relative: 9 %
Neutro Abs: 6 10*3/uL (ref 1.7–7.7)
Neutrophils Relative %: 76 %
Platelets: 318 10*3/uL (ref 150–400)
RBC: 4.24 MIL/uL (ref 3.87–5.11)
RDW: 12.6 % (ref 11.5–15.5)
WBC: 7.9 10*3/uL (ref 4.0–10.5)
nRBC: 0 % (ref 0.0–0.2)

## 2019-10-03 LAB — BASIC METABOLIC PANEL
Anion gap: 15 (ref 5–15)
BUN: 32 mg/dL — ABNORMAL HIGH (ref 8–23)
CO2: 30 mmol/L (ref 22–32)
Calcium: 10.1 mg/dL (ref 8.9–10.3)
Chloride: 90 mmol/L — ABNORMAL LOW (ref 98–111)
Creatinine, Ser: 1.11 mg/dL — ABNORMAL HIGH (ref 0.44–1.00)
GFR calc Af Amer: 50 mL/min — ABNORMAL LOW (ref >60)
GFR calc non Af Amer: 43 mL/min — ABNORMAL LOW (ref >60)
Glucose, Bld: 103 mg/dL — ABNORMAL HIGH (ref 70–99)
Potassium: 3.6 mmol/L (ref 3.5–5.1)
Sodium: 135 mmol/L (ref 135–145)

## 2019-10-03 LAB — SARS CORONAVIRUS 2 BY RT PCR (HOSPITAL ORDER, PERFORMED IN ~~LOC~~ HOSPITAL LAB): SARS Coronavirus 2: NEGATIVE

## 2019-10-03 MED ORDER — DILTIAZEM HCL ER COATED BEADS 120 MG PO CP24
120.0000 mg | ORAL_CAPSULE | Freq: Every day | ORAL | Status: DC
  Administered 2019-10-03 – 2019-10-04 (×2): 120 mg via ORAL
  Filled 2019-10-03 (×3): qty 1

## 2019-10-03 MED ORDER — ATORVASTATIN CALCIUM 10 MG PO TABS
5.0000 mg | ORAL_TABLET | ORAL | Status: DC
  Administered 2019-10-03: 5 mg via ORAL
  Filled 2019-10-03: qty 1

## 2019-10-03 MED ORDER — METOPROLOL SUCCINATE ER 25 MG PO TB24
50.0000 mg | ORAL_TABLET | Freq: Two times a day (BID) | ORAL | Status: DC
  Administered 2019-10-03 – 2019-10-04 (×2): 50 mg via ORAL
  Filled 2019-10-03 (×2): qty 2

## 2019-10-03 MED ORDER — TEMAZEPAM 15 MG PO CAPS
15.0000 mg | ORAL_CAPSULE | Freq: Every evening | ORAL | Status: DC | PRN
  Administered 2019-10-03: 15 mg via ORAL
  Filled 2019-10-03: qty 1

## 2019-10-03 MED ORDER — ACETAMINOPHEN 325 MG PO TABS
325.0000 mg | ORAL_TABLET | Freq: Once | ORAL | Status: AC
  Administered 2019-10-03: 325 mg via ORAL
  Filled 2019-10-03: qty 1

## 2019-10-03 MED ORDER — LEVOTHYROXINE SODIUM 25 MCG PO TABS
25.0000 ug | ORAL_TABLET | Freq: Every day | ORAL | Status: DC
  Administered 2019-10-03 – 2019-10-04 (×2): 25 ug via ORAL
  Filled 2019-10-03 (×2): qty 1

## 2019-10-03 MED ORDER — ACETAMINOPHEN 500 MG PO TABS: 500.0000 mg | ORAL_TABLET | Freq: Every day | ORAL | Status: DC | PRN

## 2019-10-03 MED ORDER — FENTANYL CITRATE (PF) 100 MCG/2ML IJ SOLN: 50.0000 ug | INTRAMUSCULAR | Status: DC | PRN

## 2019-10-03 MED ORDER — PROMETHAZINE HCL 25 MG PO TABS: 12.5000 mg | ORAL_TABLET | Freq: Four times a day (QID) | ORAL | Status: DC | PRN

## 2019-10-03 MED ORDER — ANASTROZOLE 1 MG PO TABS
1.0000 mg | ORAL_TABLET | Freq: Every day | ORAL | Status: DC
  Administered 2019-10-03 – 2019-10-04 (×2): 1 mg via ORAL
  Filled 2019-10-03 (×2): qty 1

## 2019-10-03 MED ORDER — FENTANYL CITRATE (PF) 100 MCG/2ML IJ SOLN
75.0000 ug | INTRAMUSCULAR | Status: AC | PRN
  Administered 2019-10-03 (×2): 75 ug via INTRAVENOUS
  Filled 2019-10-03 (×2): qty 2

## 2019-10-03 MED ORDER — LINACLOTIDE 145 MCG PO CAPS
290.0000 ug | ORAL_CAPSULE | Freq: Every day | ORAL | Status: DC
  Administered 2019-10-03 – 2019-10-04 (×2): 290 ug via ORAL
  Filled 2019-10-03 (×2): qty 2

## 2019-10-03 MED ORDER — BUSPIRONE HCL 10 MG PO TABS
15.0000 mg | ORAL_TABLET | Freq: Two times a day (BID) | ORAL | Status: DC
  Administered 2019-10-03 – 2019-10-04 (×2): 15 mg via ORAL
  Filled 2019-10-03 (×2): qty 2

## 2019-10-03 MED ORDER — APIXABAN 5 MG PO TABS
5.0000 mg | ORAL_TABLET | Freq: Two times a day (BID) | ORAL | Status: DC
  Administered 2019-10-03 – 2019-10-04 (×2): 5 mg via ORAL
  Filled 2019-10-03 (×2): qty 1

## 2019-10-03 MED ORDER — GABAPENTIN 100 MG PO CAPS
100.0000 mg | ORAL_CAPSULE | Freq: Four times a day (QID) | ORAL | Status: DC
  Administered 2019-10-03 – 2019-10-04 (×5): 100 mg via ORAL
  Filled 2019-10-03 (×5): qty 1

## 2019-10-03 MED ORDER — HYDROCODONE-ACETAMINOPHEN 5-325 MG PO TABS
1.0000 | ORAL_TABLET | Freq: Once | ORAL | Status: AC
  Administered 2019-10-03: 1 via ORAL
  Filled 2019-10-03: qty 1

## 2019-10-03 MED ORDER — HYDROCODONE-ACETAMINOPHEN 10-325 MG PO TABS
1.0000 | ORAL_TABLET | Freq: Four times a day (QID) | ORAL | Status: DC | PRN
  Administered 2019-10-03 – 2019-10-04 (×2): 1 via ORAL
  Filled 2019-10-03 (×2): qty 1

## 2019-10-03 NOTE — ED Triage Notes (Signed)
Pt BIB [facility] with complaint of back pain. EMS stats Pt suffered T12 fracture on 9/8.  PT states injury occurred while picking up a heavy box.  Pt was prescribed [place].  Last dose this morning around 8:20am with no relief.

## 2019-10-03 NOTE — ED Notes (Signed)
Ortho tech notified.  

## 2019-10-03 NOTE — Progress Notes (Signed)
Consult request has been received. CSW attempting to follow up at present time.  CSW as asked by the EPD and RN CM to review notes.  CSW notes that 1st shift ED CSW consulted Claiborne Billings in admissions at [facility] who stated there were no SNF beds available on 10/03/19.  CSW called pt's cell phone at ph: [phone] and was unable to speak to rhe pt and a VM indicating the phone belonged to a "Lisa".  Per pt's contact list her granddaughter Almetta Lovely has the same cell number.  CSW spoke to the EDP who states an attempt at SNF placement needs to be initiated for the pt's safety as she lives at home alone.  8:00 PM CSW spoke to pt who provided verbal permission for the CSW to attempt placement and for the CSW to also allow his granddaughter Lattie Haw to assist with the decision-making in the presence of the pt's RN.  CSW spoke to the pt's granddaughter Lattie Haw who also provided verbal permission for the CSW to send SNF rehab referrals out to the [facility] for SNF placement for short-term rehab.  Per Lattie Haw, her and the pt's preferences are [place] where pt resides in an [place], [facility] and also [place].   CSW will continue to follow for D/C needs.  Alphonse Guild. Tarik Teixeira  MSW, LCSW, LCAS, CSI Transitions of Care Clinical Social Worker Care Coordination Department Ph: [phone]

## 2019-10-03 NOTE — TOC Initial Note (Signed)
Transition of Care [facility]) - Initial/Assessment Note    Patient Details  Name: [name] MRN: [id number] Date of Birth: 10-24-1925  Transition [facility]) CM/SW Contact:    Claudine Mouton, LCSW Phone Number: 10/03/2019, 9:11 PM  Clinical Narrative:     Consult request has been received. CSW attempting to follow up at present time.  CSW as asked by the EPD and RN CM to review notes.  CSW notes that 1st shift ED CSW consulted Claiborne Billings in admissions at [facility] who stated there were no SNF beds available on 10/03/19.  CSW called pt's cell phone at ph: [phone] and was unable to speak to rhe pt and a VM indicating the phone belonged to a "Lisa".  Per pt's contact list her granddaughter Almetta Lovely has the same cell number.  CSW spoke to the EDP who states an attempt at SNF placement needs to be initiated for the pt's safety as she lives at home alone.  8:00 PM CSW spoke to pt who provided verbal permission for the CSW to attempt placement and for the CSW to also allow his granddaughter Lattie Haw to assist with the decision-making in the presence of the pt's RN.  CSW spoke to the pt's granddaughter Lattie Haw who also provided verbal permission for the CSW to send SNF rehab referrals out to the [facility] for SNF placement for short-term rehab.  Per Lattie Haw, her and the pt's preferences are [place] where pt resides in an [place], [facility] and also [place].               Expected Discharge Plan: Skilled Nursing Facility Barriers to Discharge: Insurance Authorization   Patient Goals and CMS Choice Patient states their goals for this hospitalization and ongoing recovery are:: Regain strength and return to her ILF.      Expected Discharge Plan and Services Expected Discharge Plan: [place] arrangements for the past 2 months: [place]                                      Prior Living  Arrangements/Services Living arrangements for the past 2 months: [place] Lives with:: Spouse Patient language and need for interpreter reviewed:: No Do you feel safe going back to the place where you live?: No      Need for Family Participation in Patient Care: Yes (Comment) Care giver support system in place?: No (comment)      Activities of Daily Living      Permission Sought/Granted Permission sought to share information with : Chartered certified accountant granted to share information with : Yes, Release of Information Signed              Emotional Assessment     Affect (typically observed): Calm, Flat, Depressed Orientation: : Oriented to Self, Oriented to Place, Oriented to  Time, Oriented to Situation      Admission diagnosis:  BACK PAIN Patient Active Problem List   Diagnosis Date Noted  . Acute bilateral low back pain with bilateral sciatica 09/28/2019  . Compression fracture of T12 vertebra ([place]) 09/28/2019  . Chronic pain of both shoulders 09/04/2019  . Thoracic spine pain 09/01/2019  . Skin cancer 06/16/2019  . Atrial fibrillation ([place]) 03/24/2019  . Memory loss 11/15/2018  . Herpes zoster 10/07/2018  . Chronic osteomyelitis of left tibia ([place]) 05/23/2017  .  Community acquired pneumonia of left lung   . Osteoporosis 08/22/2016  . Malignant neoplasm of upper-outer quadrant of left breast in female, estrogen receptor positive ([place]) 08/10/2015  . Breast mass in female 07/25/2015  . Left breast mass 07/05/2015  . Incontinence in female 07/05/2014  . Edema 01/17/2014  . Morbilliform rash 09/06/2013  . Sepsis ([place]) 08/25/2013  . Metabolic acidosis 32/76/1470  . Diverticulitis of colon with perforation 08/25/2013  . Tibia/fibula fracture, shaft, left, open type I or II, with routine healing, subsequent encounter 06/28/2013  . Constipation 06/22/2013  . Open tibial fracture 06/17/2013  . Encounter for therapeutic drug  monitoring 02/16/2013  . Well adult exam 07/21/2012  . Paresthesia 01/28/2012  . Chronic venous insufficiency 11/11/2010  . Actinic keratoses 05/30/2010  . Long term current use of anticoagulant 03/12/2010  . PEPTIC ULCER DISEASE 11/22/2009  . Iron deficiency anemia 10/31/2009  . HIP PAIN 10/31/2009  . Hypothyroidism 10/25/2009  . Disorder resulting from impaired renal function 03/19/2009  . INSOMNIA, PERSISTENT 11/07/2008  . Low back pain 09/04/2008  . Atrial flutter ([place]) 05/31/2008  . Essential hypertension 11/30/2006  . Osteoarthritis 11/30/2006  . Disorder of bone and cartilage 11/30/2006   PCP:  [name], [name], MD Pharmacy:   [facility], [place] [address] Phone: [phone] Fax: [phone]  [facility] - [place], [place] - [address] [place] [address] Phone: [phone] Fax: [phone]  [id number] - [place], [place] AT [address] [place] [address] Phone: [phone] Fax: [phone]     Social Determinants of Health ([place]) Interventions    Readmission Risk Interventions No flowsheet data found.   CSW will continue to follow for D/C needs.  Alphonse Guild. Viraaj Vorndran  MSW, LCSW, LCAS, CCS Transitions of Care Clinical Social Worker Care Coordination Department Ph: [phone]

## 2019-10-03 NOTE — ED Notes (Signed)
Patient has been comforted, provided food & juice. Pillow was given. Hospital bed order to ease patient's anxiety.

## 2019-10-03 NOTE — Progress Notes (Signed)
Orthopedic Tech Progress Note Patient Details:  [name] 1925-11-14 [id number] Ordered TLSO Patient ID: [name], female   DOB: 1925-06-23, 86 y.o.   MRN: [id number]   Tammy Sours 10/03/2019, 1:34 PM

## 2019-10-03 NOTE — ED Notes (Signed)
Patient transported to CT 

## 2019-10-03 NOTE — Progress Notes (Signed)
Per [place] MUST pt has a PASSR:  [id number]  CSW will continue to follow for D/C needs.  Alphonse Guild. Devinn Voshell  MSW, LCSW, LCAS, CCS Transitions of Care Clinical Social Worker Care Coordination Department Ph: [phone]

## 2019-10-03 NOTE — Progress Notes (Signed)
Assessment complete, referrals/signed FL2/PT notes recommending SNF, sent out via the hub.  Pt's COVID test is negative.  2nd shift ED CSW will leave handoff for 1st shift ED CSW.  CSW will continue to follow for D/C needs.  Alphonse Guild. Pink Maye  MSW, LCSW, LCAS, CCS Transitions of Care Clinical Social Worker Care Coordination Department Ph: [phone]

## 2019-10-03 NOTE — ED Notes (Signed)
Patient's brace has been placed by ortho tech. Physical Therapy with patient.

## 2019-10-03 NOTE — ED Notes (Signed)
Patient has been placed in a hospital bed.

## 2019-10-03 NOTE — ED Notes (Addendum)
RN assumes care of pt , pt denies any needs at this time , pt a/o x3 ( states location is her apartment number, family at bedside, pt appears to be in NAD at this time

## 2019-10-03 NOTE — Telephone Encounter (Signed)
LVM to rtn my call to schedule awv with nha.

## 2019-10-03 NOTE — Evaluation (Signed)
Physical Therapy Evaluation Patient Details Name: [name] MRN: [id number] DOB: March 01, 1925 Today's Date: 10/03/2019   History of Present Illness  Pt is a 86 y/o female admitted secondary to increased back pain. Found to have compression fxs at T2, T5, and L2. PMH includes anxiety, a flutter, and breast cancer.   Clinical Impression  Pt admitted secondary to problem above with deficits below. Pt requiring max A to perform basic bed mobility tasks. Pt with increased pain and poor tolerance for mobility. Pt with posterior lean in sitting and reliant on mod A for balance. Feel pt will benefit from SNF level therapies at d/c. Will continue to follow acutely to maximize functional mobility independence and safety.     Follow Up Recommendations SNF;Supervision/Assistance - 24 hour    Equipment Recommendations  Wheelchair (measurements PT);Wheelchair cushion (measurements PT)    Recommendations for Other Services       Precautions / Restrictions Precautions Precautions: Fall;Back Precaution Booklet Issued: No Precaution Comments: Verbally reviewed back precautions Required Braces or Orthoses: Spinal Brace Spinal Brace: Thoracolumbosacral orthotic (clamshell brace) Restrictions Weight Bearing Restrictions: No      Mobility  Bed Mobility Overal bed mobility: Needs Assistance Bed Mobility: Rolling;Sidelying to Sit;Sit to Sidelying Rolling: Max assist Sidelying to sit: Max assist     Sit to sidelying: Mod assist General bed mobility comments: Max A for assist with rolling and trunk and LE assist to come to sitting. Pt with posterior lean upon sitting secondary to pain. Further mobility unsafe to attempt with +1 from higher stretcher height. Mod A for LE assist. Cues for sequencing to perform log roll technique.   Transfers                    Ambulation/Gait                Stairs            Wheelchair Mobility    Modified Rankin (Stroke Patients Only)        Balance Overall balance assessment: Needs assistance Sitting-balance support: Bilateral upper extremity supported;Feet supported Sitting balance-Leahy Scale: Poor Sitting balance - Comments: Reliant on at least mod A to maintain sitting balance. Posterior lean in sitting.                                      Pertinent Vitals/Pain Pain Assessment: Faces Faces Pain Scale: Hurts worst Pain Location: back with movement Pain Descriptors / Indicators: Grimacing;Guarding Pain Intervention(s): Limited activity within patient's tolerance;Monitored during session;Repositioned    Home Living Family/patient expects to be discharged to:: Private residence Living Arrangements: Spouse/significant other Available Help at Discharge: Family Type of Home: Independent living facility Home Access: Level entry     Home Layout: One level Home Equipment: Environmental consultant - 4 wheels Additional Comments: Pt from [facility]    Prior Function Level of Independence: Independent with assistive device(s)         Comments: was using rollator PTA     Hand Dominance        Extremity/Trunk Assessment   Upper Extremity Assessment Upper Extremity Assessment: Generalized weakness    Lower Extremity Assessment Lower Extremity Assessment: Generalized weakness    Cervical / Trunk Assessment Cervical / Trunk Assessment: Other exceptions Cervical / Trunk Exceptions: Compression fxs  Communication   Communication: HOH  Cognition Arousal/Alertness: Awake/alert Behavior During Therapy: Flat affect Overall Cognitive Status: No family/caregiver  present to determine baseline cognitive functioning                                 General Comments: Pt with slow processing and required increased time to perform mobility tasks.       General Comments      Exercises     Assessment/Plan    PT Assessment Patient needs continued PT services  PT Problem List Decreased  strength;Decreased activity tolerance;Decreased mobility;Decreased balance;Decreased knowledge of precautions;Decreased safety awareness;Pain;Decreased knowledge of use of DME       PT Treatment Interventions Gait training;DME instruction;Therapeutic exercise;Functional mobility training;Therapeutic activities;Balance training;Neuromuscular re-education;Cognitive remediation;Patient/family education    PT Goals (Current goals can be found in the Care Plan section)  Acute Rehab PT Goals Patient Stated Goal: to be able to walk  PT Goal Formulation: With patient Time For Goal Achievement: 10/17/19 Potential to Achieve Goals: Good    Frequency Min 2X/week   Barriers to discharge        Co-evaluation               AM-PAC PT "6 Clicks" Mobility  Outcome Measure Help needed turning from your back to your side while in a flat bed without using bedrails?: Total Help needed moving from lying on your back to sitting on the side of a flat bed without using bedrails?: Total Help needed moving to and from a bed to a chair (including a wheelchair)?: Total Help needed standing up from a chair using your arms (e.g., wheelchair or bedside chair)?: Total Help needed to walk in hospital room?: Total Help needed climbing 3-5 steps with a railing? : Total 6 Click Score: 6    End of Session Equipment Utilized During Treatment: Back brace Activity Tolerance: Patient limited by pain Patient left: in bed;with call bell/phone within reach (on stretcher in ED ) Nurse Communication: Mobility status PT Visit Diagnosis: Unsteadiness on feet (R26.81);Muscle weakness (generalized) (M62.81);Difficulty in walking, not elsewhere classified (R26.2);Pain Pain - part of body:  (back)    Time: 9432-7614 PT Time Calculation (min) (ACUTE ONLY): 15 min   Charges:   PT Evaluation $PT Eval Moderate Complexity: 1 Mod          Reuel Derby, PT, DPT  Acute Rehabilitation Services  Pager: [phone] Office: [phone]   Rudean Hitt 10/03/2019, 3:59 PM

## 2019-10-03 NOTE — NC FL2 (Signed)
[place] LEVEL OF CARE SCREENING TOOL     IDENTIFICATION  Patient Name: [name] Birthdate: May 07, 1925 Sex: female Admission Date (Current Location): 10/03/2019  [facility] and [place] Number:  Herbalist and Address:         Provider Number: [id number]  Attending Physician Name and Address:  Default, Provider, MD  Relative Name and Phone Number:       Current Level of Care: Hospital Recommended Level of Care: [place] Prior Approval Number:    Date Approved/Denied:   PASRR Number: [id number]  Discharge Plan: SNF    Current Diagnoses: Patient Active Problem List   Diagnosis Date Noted  . Acute bilateral low back pain with bilateral sciatica 09/28/2019  . Compression fracture of T12 vertebra ([place]) 09/28/2019  . Chronic pain of both shoulders 09/04/2019  . Thoracic spine pain 09/01/2019  . Skin cancer 06/16/2019  . Atrial fibrillation ([place]) 03/24/2019  . Memory loss 11/15/2018  . Herpes zoster 10/07/2018  . Chronic osteomyelitis of left tibia ([place]) 05/23/2017  . Community acquired pneumonia of left lung   . Osteoporosis 08/22/2016  . Malignant neoplasm of upper-outer quadrant of left breast in female, estrogen receptor positive ([place]) 08/10/2015  . Breast mass in female 07/25/2015  . Left breast mass 07/05/2015  . Incontinence in female 07/05/2014  . Edema 01/17/2014  . Morbilliform rash 09/06/2013  . Sepsis ([place]) 08/25/2013  . Metabolic acidosis 56/31/4970  . Diverticulitis of colon with perforation 08/25/2013  . Tibia/fibula fracture, shaft, left, open type I or II, with routine healing, subsequent encounter 06/28/2013  . Constipation 06/22/2013  . Open tibial fracture 06/17/2013  . Encounter for therapeutic drug monitoring 02/16/2013  . Well adult exam 07/21/2012  . Paresthesia 01/28/2012  . Chronic venous insufficiency 11/11/2010  . Actinic keratoses 05/30/2010  . Long term current use of anticoagulant  03/12/2010  . PEPTIC ULCER DISEASE 11/22/2009  . Iron deficiency anemia 10/31/2009  . HIP PAIN 10/31/2009  . Hypothyroidism 10/25/2009  . Disorder resulting from impaired renal function 03/19/2009  . INSOMNIA, PERSISTENT 11/07/2008  . Low back pain 09/04/2008  . Atrial flutter ([place]) 05/31/2008  . Essential hypertension 11/30/2006  . Osteoarthritis 11/30/2006  . Disorder of bone and cartilage 11/30/2006    Orientation RESPIRATION BLADDER Height & Weight     Self, Time, Situation, Place  Normal Incontinent Weight: 158 lb 15.2 oz (72.1 kg) Height:  5' 5.5" (166.4 cm)  BEHAVIORAL SYMPTOMS/MOOD NEUROLOGICAL BOWEL NUTRITION STATUS      Incontinent Diet (Heart Healthy diet (low salt))  AMBULATORY STATUS COMMUNICATION OF NEEDS Skin   Extensive Assist Verbally Normal                       Personal Care Assistance Level of Assistance  Bathing, Dressing Bathing Assistance: Limited assistance   Dressing Assistance: Limited assistance     Functional Limitations Info  Hearing (Patient is hard of hearing)   Hearing Info: Impaired (Pt is hard of hearing)      SPECIAL CARE FACTORS FREQUENCY  PT (By licensed PT), OT (By licensed OT)     PT Frequency: 5 OT Frequency: 5            Contractures Contractures Info: Not present    Additional Factors Info  Code Status, Allergies Code Status Info: FULL CODE Allergies Info: Zosyn (Piperacillin Sod-tazobactam So), Atenolol, Clarithromycin, Codeine Sulfate, Levaquin (Levofloxacin), Macrodantin, Oxycodone-acetaminophen, Percocet (Oxycodone-acetaminophen)  Current Medications (10/03/2019):  This is the current hospital active medication list Current Facility-Administered Medications  Medication Dose Route Frequency Provider Last Rate Last Admin  . acetaminophen (TYLENOL) tablet 500 mg  500 mg Oral Daily PRN Pati Gallo S, PA      . anastrozole (ARIMIDEX) tablet 1 mg  1 mg Oral Daily Fondaw, Wylder S, PA   1 mg at  10/03/19 1813  . apixaban (ELIQUIS) tablet 5 mg  5 mg Oral BID F[place], Wylder S, PA      . atorvastatin (LIPITOR) tablet 5 mg  5 mg Oral QODAY Fondaw, Wylder S, PA   5 mg at 10/03/19 1806  . busPIRone (BUSPAR) tablet 15 mg  15 mg Oral BID F[place], Wylder S, PA      . diltiazem (CARDIZEM CD) 24 hr capsule 120 mg  120 mg Oral Daily Pati Gallo S, PA   120 mg at 10/03/19 1807  . gabapentin (NEURONTIN) capsule 100 mg  100 mg Oral QID Pati Gallo S, PA   100 mg at 10/03/19 1807  . HYDROcodone-acetaminophen (NORCO) 10-325 MG per tablet 1 tablet  1 tablet Oral Q6H PRN Tedd Sias, PA   1 tablet at 10/03/19 1953  . levothyroxine (SYNTHROID) tablet 25 mcg  25 mcg Oral Daily Pati Gallo S, [place]   25 mcg at 10/03/19 1807  . linaclotide (LINZESS) capsule 290 mcg  290 mcg Oral Daily Pati Gallo [place], [place]   290 mcg at 10/03/19 1813  . metoprolol succinate (TOPROL-XL) 24 hr tablet 50 mg  50 mg Oral BID Fondaw, Wylder S, PA      . promethazine (PHENERGAN) tablet 12.5 mg  12.5 mg Oral Q6H PRN F[place], Wylder S, PA      . temazepam (RESTORIL) capsule 15 mg  15 mg Oral QHS PRN Pati Gallo S, PA   15 mg at 10/03/19 1953   Current Outpatient Medications  Medication Sig Dispense Refill  . acetaminophen (TYLENOL) 500 MG tablet Take 500 mg by mouth daily as needed for mild pain.     [name] anastrozole (ARIMIDEX) 1 MG tablet Take 1 tablet (1 mg total) by mouth daily. 90 tablet 3  . apixaban (ELIQUIS) 5 MG TABS tablet Take 1 tablet (5 mg total) by mouth 2 (two) times daily. 180 tablet 1  . atorvastatin (LIPITOR) 10 MG tablet Take 0.5 tablets (5 mg total) by mouth every other day. 90 tablet 3  . B Complex-C (B-COMPLEX WITH VITAMIN C) tablet Take 1 tablet by mouth daily.    . busPIRone (BUSPAR) 15 MG tablet Take 1 tablet (15 mg total) by mouth 2 (two) times daily. 60 tablet 11  . Cholecalciferol (VITAMIN D3) 1000 UNITS tablet Take 1,000 Units by mouth daily.      [name] diltiazem (CARDIZEM CD) 120 MG 24 hr capsule TAKE  1 CAPSULE (120 MG TOTAL) BY MOUTH DAILY. 90 capsule 3  . doxycycline (VIBRA-TABS) 100 MG tablet Take 1 tablet (100 mg total) by mouth 2 (two) times daily. 180 tablet 3  . gabapentin (NEURONTIN) 100 MG capsule Take 1 capsule (100 mg total) by mouth 4 (four) times daily. Pain 120 capsule 3  . HYDROcodone-acetaminophen (NORCO) 10-325 MG tablet Take 1 tablet by mouth every 6 (six) hours as needed. 65 tablet 0  . levothyroxine (SYNTHROID) 25 MCG tablet TAKE 1 TABLET BY MOUTH DAILY FOR THYROID. 30 tablet 11  . linaclotide (LINZESS) 290 MCG CAPS capsule Take 1 capsule (290 mcg total) by mouth daily. 90 capsule  3  . methylPREDNISolone (MEDROL DOSEPAK) 4 MG TBPK tablet As directed 21 tablet 0  . metoprolol succinate (TOPROL-XL) 50 MG 24 hr tablet Take 1 tablet (50 mg total) by mouth 2 (two) times daily. Take with or immediately following a meal. 180 tablet 3  . Multiple Vitamins-Minerals (MULTIVITAMIN WITH MINERALS) tablet Take 1 tablet by mouth daily.    . polyethylene glycol (MIRALAX) 17 g packet Take 17 g by mouth daily. 30 each 0  . promethazine (PHENERGAN) 12.5 MG tablet Take 1 tablet (12.5 mg total) by mouth every 6 (six) hours as needed for nausea or vomiting. 65 tablet 1  . temazepam (RESTORIL) 15 MG capsule Take 1 capsule (15 mg total) by mouth at bedtime as needed for sleep. 90 capsule 1  . vitamin C (ASCORBIC ACID) 500 MG tablet Take 500 mg by mouth daily.       Discharge Medications: Please see discharge summary for a list of discharge medications.  Relevant Imaging Results:  Relevant Lab Results:   Additional Information [id number]  Alphonse Guild Rochell Puett, LCSW

## 2019-10-03 NOTE — Progress Notes (Addendum)
CSW spoke with Claiborne Billings at [place] regarding this patient - CSW informed Claiborne Billings that per [place], [place] this patient has three fractures without any scheduled interventions at this time. Claiborne Billings reports there are no beds in the SNF side at [facility].   PA ordered PT/OT evaluation for this patient.  Madilyn Fireman, MSW, LCSW-A Transitions of Care  Clinical Social Worker  [facility] Emergency Departments  Medical ICU [phone]

## 2019-10-03 NOTE — ED Provider Notes (Signed)
[facility] EMERGENCY DEPARTMENT Provider Note   CSN: [id number] Arrival date & time: 10/03/19  4193     History Chief Complaint  Patient presents with  . Back Pain    [name] is a 86 y.o. female.  HPI Patient is a 86 year old female with a past medical history detailed below presented today for continued mid back pain.  She states that it is sharp and burning and severe at 10/10 constant worse with movement and walking.  She states that the symptoms began somewhat over 2 weeks ago.  She states she was lifting a heavy object when the pain began and states that the gabapentin she takes chronically has not been helpful.  She was seen 9/8 and was given Norco by her primary care doctor after x-rays show a likely old compression fracture of T12.  She states she got very little relief from the [place].  She denies any new changes with her sensation--she states that she chronically has very little sensation in her left leg--denies any new bowel or bladder incontinence but does endorse      Past Medical History:  Diagnosis Date  . Anxiety    denies  . Atrial flutter ([place])   . Bilateral edema of lower extremity   . Breast cancer ([place])    left breast  . Chronic constipation   . Chronic diastolic CHF (congestive heart failure) ([place])   . Diverticulosis of colon   . GERD (gastroesophageal reflux disease)   . H/O hiatal hernia   . History of cellulitis    LEFT LOWER LEG --  SEPT 2015  . History of colitis    DX  2003  WITH ISCHEMIC COLITIS  . History of diverticulitis of colon    perferated diverticulitis w/ abscess 08-25-2013 drain placed --  resolved without surgical intervention  . History of GI bleed    2011--- UPPER SECONARY GASTRIC ULCER FROM NSAID USE  . History of Helicobacter pylori infection    2011  . History of ulcer of lower limb    2012   RIGHT LOWER EXTREMITIY--  STATSIS ULCER  . Hyperlipidemia   . Hypertension   . Hypothyroidism   . Iron  deficiency anemia 2011   elevated MCV  . LBP (low back pain)    severe lumbar spondylosis s/p L3/L4,L4/L5 fusion 12/10  . Lumbar spondylolysis   . OA (osteoarthritis)    "hands" (08/25/2013)  . Open wound of left lower leg   . Osteopenia   . Persistent atrial fibrillation ([place])    CARDIOLOGIST-   DR MCALANY  . RBBB   . Urinary frequency     Patient Active Problem List   Diagnosis Date Noted  . Acute bilateral low back pain with bilateral sciatica 09/28/2019  . Compression fracture of T12 vertebra ([place]) 09/28/2019  . Chronic pain of both shoulders 09/04/2019  . Thoracic spine pain 09/01/2019  . Skin cancer 06/16/2019  . Atrial fibrillation ([place]) 03/24/2019  . Memory loss 11/15/2018  . Herpes zoster 10/07/2018  . Chronic osteomyelitis of left tibia ([place]) 05/23/2017  . Community acquired pneumonia of left lung   . Osteoporosis 08/22/2016  . Malignant neoplasm of upper-outer quadrant of left breast in female, estrogen receptor positive ([place]) 08/10/2015  . Breast mass in female 07/25/2015  . Left breast mass 07/05/2015  . Incontinence in female 07/05/2014  . Edema 01/17/2014  . Morbilliform rash 09/06/2013  . Sepsis ([place]) 08/25/2013  . Metabolic acidosis 79/02/4095  . Diverticulitis  of colon with perforation 08/25/2013  . Tibia/fibula fracture, shaft, left, open type I or II, with routine healing, subsequent encounter 06/28/2013  . Constipation 06/22/2013  . Open tibial fracture 06/17/2013  . Encounter for therapeutic drug monitoring 02/16/2013  . Well adult exam 07/21/2012  . Paresthesia 01/28/2012  . Chronic venous insufficiency 11/11/2010  . Actinic keratoses 05/30/2010  . Long term current use of anticoagulant 03/12/2010  . PEPTIC ULCER DISEASE 11/22/2009  . Iron deficiency anemia 10/31/2009  . HIP PAIN 10/31/2009  . Hypothyroidism 10/25/2009  . Disorder resulting from impaired renal function 03/19/2009  . INSOMNIA, PERSISTENT 11/07/2008  . Low back pain 09/04/2008   . Atrial flutter ([place]) 05/31/2008  . Essential hypertension 11/30/2006  . Osteoarthritis 11/30/2006  . Disorder of bone and cartilage 11/30/2006    Past Surgical History:  Procedure Laterality Date  . APPLICATION OF A-CELL OF EXTREMITY Left 06/21/2013   Procedure: APPLICATION OF A-CELL OF EXTREMITY;  Surgeon: Theodoro Kos, DO;  Location: [place];  Service: Plastics;  Laterality: Left;  . APPLICATION OF A-CELL OF EXTREMITY Left 08/22/2013   Procedure: APPLICATION OF A-CELL/VAC TO LOWER LEFT LEG WOUND;  Surgeon: Theodoro Kos, DO;  Location: [place];  Service: Plastics;  Laterality: Left;  . APPLICATION OF A-CELL OF EXTREMITY Left 02/06/2014   Procedure: WITH PLACEMENT OF A -CELL ;  Surgeon: Theodoro Kos, DO;  Location: [place];  Service: Plastics;  Laterality: Left;  . APPLICATION OF A-CELL OF EXTREMITY Left 08/09/2014   Procedure: PLACEMENT OF A CELL;  Surgeon: Theodoro Kos, DO;  Location: [place];  Service: Plastics;  Laterality: Left;  . APPLICATION OF WOUND VAC Left 06/16/2013   Procedure: APPLICATION OF WOUND VAC;  Surgeon: Meredith Pel, MD;  Location: [place];  Service: Orthopedics;  Laterality: Left;  . APPLICATION OF WOUND VAC Left 06/21/2013   Procedure: APPLICATION OF WOUND VAC;  Surgeon: Theodoro Kos, DO;  Location: [place];  Service: Plastics;  Laterality: Left;  . BIOPSY BREAST  07/25/2015   LEFT RADIOACTIVE SEED GUIDED EXCISIONAL BREAST BIOPSY (Left) as a surgical intervention  . EXTERNAL FIXATION LEG Left 06/16/2013   Procedure: EXTERNAL FIXATION LEG;  Surgeon: Meredith Pel, MD;  Location: [place];  Service: Orthopedics;  Laterality: Left;  . EXTERNAL FIXATION REMOVAL Left 06/21/2013   Procedure: REMOVAL EXTERNAL FIXATION LEG;  Surgeon: Meredith Pel, MD;  Location: [place];  Service: Orthopedics;  Laterality: Left;  . EYE SURGERY Bilateral    cataract removal  . HARDWARE REMOVAL Left 05/23/2014   Procedure: HARDWARE REMOVAL;  Surgeon:  Meredith Pel, MD;  Location: [place];  Service: Orthopedics;  Laterality: Left;  REMOVAL OF HARDWARE LEFT TIBIA  . I & D EXTREMITY Left 06/16/2013   Procedure: IRRIGATION AND DEBRIDEMENT EXTREMITY;  Surgeon: Meredith Pel, MD;  Location: [place];  Service: Orthopedics;  Laterality: Left;  . I & D EXTREMITY Left 02/06/2014   Procedure: IRRIGATION AND DEBRIDEMENT LEFT LEG WOUND ;  Surgeon: Theodoro Kos, DO;  Location: [place];  Service: Plastics;  Laterality: Left;  . I & D EXTREMITY Left 08/09/2014   Procedure: IRRIGATION AND DEBRIDEMENT  OF LEFT LOWER LEG;  Surgeon: Theodoro Kos, DO;  Location: [place];  Service: Plastics;  Laterality: Left;  . LUMBAR FUSION  01-02-2009   L3-L5  . RADIOACTIVE SEED GUIDED EXCISIONAL BREAST BIOPSY Left 07/25/2015   Procedure: LEFT RADIOACTIVE SEED GUIDED EXCISIONAL BREAST BIOPSY;  Surgeon: Rolm Bookbinder, MD;  [facility];  Service: General;  Laterality: Left;  . RE-EXCISION OF BREAST LUMPECTOMY Left 09/05/2015   Procedure: RE-EXCISION OF LEFT BREAST LUMPECTOMY;  Surgeon: Rolm Bookbinder, MD;  Location: [place];  Service: General;  Laterality: Left;  . SHOULDER OPEN ROTATOR CUFF REPAIR Left 1997  . TIBIA IM NAIL INSERTION Left 06/21/2013   Procedure: INTRAMEDULLARY (IM) NAIL TIBIAL;  Surgeon: Meredith Pel, MD;  Location: [place];  Service: Orthopedics;  Laterality: Left;  . TONSILLECTOMY  AS CHILD  . TOTAL HIP ARTHROPLASTY Right 06-07-2010  . TRANSTHORACIC ECHOCARDIOGRAM  09-11-2010   DR MCALHANY   LVSF  55-60%/  MILD MV CALCIFICATION WITH NO STENOSIS/  MILD MR & TR / MODERATE LAE/  MODERATE TO SEVERE RAE  . UPPER GI ENDOSCOPY  03-29-2010     OB History   No obstetric history on file.     Family History  Problem Relation Age of Onset  . Cancer Father   . Hypertension Other     Social History   Tobacco Use  . Smoking status: Never Smoker  . Smokeless tobacco: Never Used  Vaping Use  . Vaping Use: Never used   Substance Use Topics  . Alcohol use: No    Comment: 1 glass occ wine 08/03/14- none in a year  . Drug use: No    Home Medications Prior to Admission medications   Medication Sig Start Date End Date Taking? Authorizing Provider  acetaminophen (TYLENOL) 500 MG tablet Take 500 mg by mouth daily as needed for mild pain.     [provider]  anastrozole (ARIMIDEX) 1 MG tablet Take 1 tablet (1 mg total) by mouth daily. 09/08/19   Magrinat, Virgie Dad, MD  apixaban (ELIQUIS) 5 MG TABS tablet Take 1 tablet (5 mg total) by mouth 2 (two) times daily. 06/14/19   Burnell Blanks, MD  atorvastatin (LIPITOR) 10 MG tablet Take 0.5 tablets (5 mg total) by mouth every other day. 09/15/19   [name], [name], MD  B Complex-C (B-COMPLEX WITH VITAMIN C) tablet Take 1 tablet by mouth daily.    [provider]  busPIRone (BUSPAR) 15 MG tablet Take 1 tablet (15 mg total) by mouth 2 (two) times daily. 09/15/19   [name], [name], MD  Cholecalciferol (VITAMIN D3) 1000 UNITS tablet Take 1,000 Units by mouth daily.      [provider]  diltiazem (CARDIZEM CD) 120 MG 24 hr capsule TAKE 1 CAPSULE (120 MG TOTAL) BY MOUTH DAILY. 02/15/19   [name], [name], MD  doxycycline (VIBRA-TABS) 100 MG tablet Take 1 tablet (100 mg total) by mouth 2 (two) times daily. 02/15/19   [name], [name], MD  gabapentin (NEURONTIN) 100 MG capsule Take 1 capsule (100 mg total) by mouth 4 (four) times daily. Pain 09/15/19   [name], [name], MD  HYDROcodone-acetaminophen (NORCO) 10-325 MG tablet Take 1 tablet by mouth every 6 (six) hours as needed. 09/28/19   Janith Lima, MD  levothyroxine (SYNTHROID) 25 MCG tablet TAKE 1 TABLET BY MOUTH DAILY FOR THYROID. 08/29/19   [name], [name], MD  linaclotide (LINZESS) 290 MCG CAPS capsule Take 1 capsule (290 mcg total) by mouth daily. 01/06/19   [name], [name], MD  methylPREDNISolone (MEDROL DOSEPAK) 4 MG TBPK tablet As directed 09/01/19    [name], [name], MD  metoprolol succinate (TOPROL-XL) 50 MG 24 hr tablet Take 1 tablet (50 mg total) by mouth 2 (two) times daily. Take with or immediately following a meal. 02/15/19  [name], [name], MD  Multiple Vitamins-Minerals (MULTIVITAMIN WITH MINERALS) tablet Take 1 tablet by mouth daily.    [provider]  polyethylene glycol (MIRALAX) 17 g packet Take 17 g by mouth daily. 10/15/18   Maudie Flakes, MD  promethazine (PHENERGAN) 12.5 MG tablet Take 1 tablet (12.5 mg total) by mouth every 6 (six) hours as needed for nausea or vomiting. 09/28/19   Janith Lima, MD  temazepam (RESTORIL) 15 MG capsule Take 1 capsule (15 mg total) by mouth at bedtime as needed for sleep. 09/14/19   [name], [name], MD  vitamin C (ASCORBIC ACID) 500 MG tablet Take 500 mg by mouth daily.    [provider]    Allergies    Zosyn [piperacillin sod-tazobactam so], Atenolol, Clarithromycin, Codeine sulfate, Levaquin [levofloxacin], Macrodantin, Oxycodone-acetaminophen, and Percocet [oxycodone-acetaminophen]  Review of Systems   Review of Systems  Constitutional: Negative for fever.  HENT: Negative for congestion.   Respiratory: Negative for shortness of breath.   Cardiovascular: Negative for chest pain.  Gastrointestinal: Negative for abdominal distention.  Musculoskeletal: Positive for back pain.       Pain in hip  Neurological: Negative for dizziness and headaches.    Physical Exam Updated Vital Signs BP (!) 158/107 (BP Location: Right Arm)   Pulse 94   Temp 97.8 F (36.6 C) (Oral)   Resp (!) 25   Ht 5' 5.5" (1.664 m)   Wt 72.1 kg   SpO2 99%   BMI 26.05 kg/m   Physical Exam Vitals and nursing note reviewed.  Constitutional:      General: She is not in acute distress.    Comments: Pleasant 86 year old female appears uncomfortable no acute distress  HENT:     Head: Normocephalic and atraumatic.     Nose: Nose normal.     Mouth/Throat:     Mouth: Mucous  membranes are moist.  Eyes:     General: No scleral icterus. Cardiovascular:     Rate and Rhythm: Normal rate and regular rhythm.     Pulses: Normal pulses.     Heart sounds: Normal heart sounds.  Pulmonary:     Effort: Pulmonary effort is normal. No respiratory distress.     Breath sounds: Normal breath sounds. No wheezing.  Abdominal:     Palpations: Abdomen is soft.     Tenderness: There is no abdominal tenderness. There is no guarding or rebound.  Musculoskeletal:     Cervical back: Normal range of motion.     Right lower leg: No edema.     Left lower leg: No edema.     Comments: Tenderness to palpation of right hip at the superior anterior iliac Tenderness to palpation of thoracic and lumbar vertebra.  Tenderness is somewhat diffuse primarily in the at the junction of the L/T  Skin:    General: Skin is warm and dry.     Capillary Refill: Capillary refill takes less than 2 seconds.  Neurological:     Mental Status: She is alert. Mental status is at baseline.     Comments: Sensation decreased in left foot but intact--chronic Sensation fully intact in the right foot. Able to move both upper and lower extremities symmetrically with appropriate strength. Minimal muscle tone and bulk.  Psychiatric:        Mood and Affect: Mood normal.        Behavior: Behavior normal.     ED Results / Procedures / Treatments   Labs (all labs ordered are  listed, but only abnormal results are displayed) Labs Reviewed  BASIC METABOLIC PANEL - Abnormal; Notable for the following components:      Result Value   Chloride 90 (*)    Glucose, Bld 103 (*)    BUN 32 (*)    Creatinine, Ser 1.11 (*)    GFR calc non Af Amer 43 (*)    GFR calc Af Amer 50 (*)    All other components within normal limits  CBC WITH DIFFERENTIAL/PLATELET - Abnormal; Notable for the following components:   MCV 103.5 (*)    All other components within normal limits  SARS CORONAVIRUS 2 BY RT PCR (HOSPITAL ORDER, [facility])    EKG None  Radiology CT Thoracic Spine Wo Contrast  Result Date: 10/03/2019 CLINICAL DATA:  Mid back pain.  None fracture at T12 09/28/2019 EXAM: CT THORACIC AND LUMBAR SPINE WITHOUT CONTRAST TECHNIQUE: Multidetector CT imaging of the thoracic and lumbar spine was performed without contrast. Multiplanar CT image reconstructions were also generated. COMPARISON:  None. FINDINGS: CT THORACIC SPINE FINDINGS Alignment: Exaggerated thoracic kyphosis without listhesis. Vertebrae: T5 compression fracture with horizontal fracture plane that is irregular and branching, likely due to acuity. No visible involvement of the posterior elements. Height loss measures 35%. No retropulsion. T12 inferior endplate fracture with faint horizontal fracture plane. No retropulsion. Height loss measures up to 40%. Generalized osteopenia. Paraspinal and other soft tissues: Paraspinal hematoma at T5. No overt soft tissue mass when allowing for paraspinal edema. Disc levels: Diffuse disc space narrowing and endplate degeneration. Mild mid and upper thoracic facet spurring levels of facet ankylosis at T5-T7. Partially covered cervical spine with advanced degenerative disease. There is a disc osteophyte complex impinging on the flattened cord at C4-5, adjacent to a C5-6 solid arthrodesis. CT LUMBAR SPINE FINDINGS Segmentation: 5 lumbar type vertebrae Alignment: No traumatic malaligned Vertebrae: L2 compression fracture with horizontal acute appearing fracture plane. There is also a vertical component extending towards the inferior endplate. No retropulsion. Height loss measures up to 30%. No posterior element involvement or underlying bone lesion is seen. Prominent generalized osteopenia Paraspinal and other soft tissues: Mild paravertebral fat haziness at the L2 fracture. Disc levels: L3-4 and L4-5 PLIF with solid arthrodesis. Multilevel degenerative facet spurring. IMPRESSION: CT THORACIC SPINE IMPRESSION  1. Acute appearing T5 fracture with 35% height loss and no retropulsion. The T5-T7 facets appear ankylosed. 2. Subacute to chronic appearing T12 fracture with 40% height loss. 3. Incidental coverage of the cervical spine shows advanced degenerative spinal stenosis at C4-5. CT LUMBAR SPINE IMPRESSION 1. Acute appearing L2 compression fracture with 30% height loss. 2. L3-4 and L4-5 solid arthrodesis. Electronically Signed   By: Monte Fantasia M.D.   On: 10/03/2019 11:54   CT Lumbar Spine Wo Contrast  Result Date: 10/03/2019 CLINICAL DATA:  Mid back pain.  None fracture at T12 09/28/2019 EXAM: CT THORACIC AND LUMBAR SPINE WITHOUT CONTRAST TECHNIQUE: Multidetector CT imaging of the thoracic and lumbar spine was performed without contrast. Multiplanar CT image reconstructions were also generated. COMPARISON:  None. FINDINGS: CT THORACIC SPINE FINDINGS Alignment: Exaggerated thoracic kyphosis without listhesis. Vertebrae: T5 compression fracture with horizontal fracture plane that is irregular and branching, likely due to acuity. No visible involvement of the posterior elements. Height loss measures 35%. No retropulsion. T12 inferior endplate fracture with faint horizontal fracture plane. No retropulsion. Height loss measures up to 40%. Generalized osteopenia. Paraspinal and other soft tissues: Paraspinal hematoma at T5. No overt soft tissue  mass when allowing for paraspinal edema. Disc levels: Diffuse disc space narrowing and endplate degeneration. Mild mid and upper thoracic facet spurring levels of facet ankylosis at T5-T7. Partially covered cervical spine with advanced degenerative disease. There is a disc osteophyte complex impinging on the flattened cord at C4-5, adjacent to a C5-6 solid arthrodesis. CT LUMBAR SPINE FINDINGS Segmentation: 5 lumbar type vertebrae Alignment: No traumatic malaligned Vertebrae: L2 compression fracture with horizontal acute appearing fracture plane. There is also a vertical  component extending towards the inferior endplate. No retropulsion. Height loss measures up to 30%. No posterior element involvement or underlying bone lesion is seen. Prominent generalized osteopenia Paraspinal and other soft tissues: Mild paravertebral fat haziness at the L2 fracture. Disc levels: L3-4 and L4-5 PLIF with solid arthrodesis. Multilevel degenerative facet spurring. IMPRESSION: CT THORACIC SPINE IMPRESSION 1. Acute appearing T5 fracture with 35% height loss and no retropulsion. The T5-T7 facets appear ankylosed. 2. Subacute to chronic appearing T12 fracture with 40% height loss. 3. Incidental coverage of the cervical spine shows advanced degenerative spinal stenosis at C4-5. CT LUMBAR SPINE IMPRESSION 1. Acute appearing L2 compression fracture with 30% height loss. 2. L3-4 and L4-5 solid arthrodesis. Electronically Signed   By: Monte Fantasia M.D.   On: 10/03/2019 11:54   DG Hip Unilat W or Wo Pelvis 2-3 Views Right  Result Date: 10/03/2019 CLINICAL DATA:  Pain EXAM: DG HIP (WITH OR WITHOUT PELVIS) 2-3V RIGHT COMPARISON:  Jun 07, 2010 FINDINGS: Frontal pelvis as well as frontal and lateral right hip images were obtained. Bones are diffusely osteoporotic. There is a total hip replacement right with prosthetic components well-seated. No acute fracture or dislocation. There is slight narrowing of the left hip joint. No erosive change. IMPRESSION: Diffuse osteoporosis. Total hip replacement right with prosthetic components well-seated. No fracture or dislocation. Mild narrowing the central. Electronically Signed   By: Lowella Grip III M.D.   On: 10/03/2019 12:57    Procedures Procedures (including critical care time)  Medications Ordered in ED Medications  apixaban (ELIQUIS) tablet 5 mg (has no administration in time range)  busPIRone (BUSPAR) tablet 15 mg (has no administration in time range)  gabapentin (NEURONTIN) capsule 100 mg (100 mg Oral Given 10/03/19 1807)   HYDROcodone-acetaminophen (NORCO) 10-325 MG per tablet 1 tablet (has no administration in time range)  levothyroxine (SYNTHROID) tablet 25 mcg (25 mcg Oral Given 10/03/19 1807)  linaclotide (LINZESS) capsule 290 mcg (290 mcg Oral Given 10/03/19 1813)  metoprolol succinate (TOPROL-XL) 24 hr tablet 50 mg (has no administration in time range)  promethazine (PHENERGAN) tablet 12.5 mg (has no administration in time range)  temazepam (RESTORIL) capsule 15 mg (has no administration in time range)  acetaminophen (TYLENOL) tablet 500 mg (has no administration in time range)  anastrozole (ARIMIDEX) tablet 1 mg (1 mg Oral Given 10/03/19 1813)  atorvastatin (LIPITOR) tablet 5 mg (5 mg Oral Given 10/03/19 1806)  diltiazem (CARDIZEM CD) 24 hr capsule 120 mg (120 mg Oral Given 10/03/19 1807)  HYDROcodone-acetaminophen (NORCO/VICODIN) 5-325 MG per tablet 1 tablet (1 tablet Oral Given 10/03/19 1113)  acetaminophen (TYLENOL) tablet 325 mg (325 mg Oral Given 10/03/19 1113)  fentaNYL (SUBLIMAZE) injection 75 mcg (75 mcg Intravenous Given 10/03/19 1706)    ED Course  I have reviewed the triage vital signs and the nursing notes.  Pertinent labs & imaging results that were available during my care of the patient were reviewed by me and considered in my medical decision making (see chart for details).  Patient is 86 year old female with past medical history detailed above presented today for back pain.  She was seen 9/8 for similar symptoms she has had the symptoms for approximately 2 weeks at that time.  She had an x-ray that showed a old appearing T12 compression fracture.  On my examination patient has more widespread region of tenderness and the low back and mid back.  Concern for more extensive fractures.  She is able to stand up and ambulate and I walked patient at bedside however she is very slow to walk and has significant pain.    Clinical Course as of Oct 02 1944  Mon Oct 03, 2019  1202 CT scan shows T5 (35%  height change), L2 (30% height loss).    CT THORACIC SPINE IMPRESSION  1. Acute appearing T5 fracture with 35% height loss and no retropulsion. The T5-T7 facets appear ankylosed. 2. Subacute to chronic appearing T12 fracture with 40% height loss. 3. Incidental coverage of the cervical spine shows advanced degenerative spinal stenosis at C4-5.  CT LUMBAR SPINE IMPRESSION  1. Acute appearing L2 compression fracture with 30% height loss. 2. L3-4 and L4-5 solid arthrodesis.   [WF]  8676 Discussed with Dr. Saintclair Halsted who recommends brace and follow up in clinic. States this will be a non-surgical management given age and degree of compression.    [WF]  1306 Hip xray negative for fracture. Minimal TTP on my exam low suspicion for fracture.  IMPRESSION: Diffuse osteoporosis. Total hip replacement right with prosthetic components well-seated. No fracture or dislocation. Mild narrowing the central.   [WF]  1423 Discussed with social work who states that [facility] is possibly able to provide patient with additional support on the assisted living side. PT eval pending. Will follow up with SW/CMGMT after eval   [WF]    Clinical Course User Index [WF] Tedd Sias, PA   I discussed this case with my attending physician who cosigned this note including patient's presenting symptoms, physical exam, and planned diagnostics and interventions. Attending physician stated agreement with plan or made changes to plan which were implemented.   Attending physician assessed patient at bedside.  Social work and case management consulted to facilitate patient being moved to SNF.  I discussed at length the patient's work-up and treatment and ongoing plans with patient's daughter.  She is understanding of plan.  MDM Rules/Calculators/A&P                           Covid swab and basic labs obtained to help facilitate placement.  No acute abnormalities on CBC, BMP or Covid swab.  Patient with likely 2  new compression fractures and a likely remote one.  Discussed with Dr. Saintclair Halsted.  Patient is not a surgical candidate.  Will discharge with back brace and pain medicine.  As patient is in need of SNF placement will await social work placement.  Has been assessed by PT who agrees with need for SNF placement.   Final Clinical Impression(s) / ED Diagnoses Final diagnoses:  Closed wedge compression fracture of T5 vertebra, initial encounter ([place])  Closed compression fracture of L2 lumbar vertebra with delayed healing, subsequent encounter    Rx / DC Orders ED Discharge Orders    None       Tedd Sias, [place] 10/03/19 1947    Tegeler, Gwenyth Allegra, MD 10/04/19 0830

## 2019-10-04 DIAGNOSIS — K589 Irritable bowel syndrome without diarrhea: Secondary | ICD-10-CM | POA: Diagnosis not present

## 2019-10-04 DIAGNOSIS — Z4789 Encounter for other orthopedic aftercare: Secondary | ICD-10-CM | POA: Diagnosis not present

## 2019-10-04 DIAGNOSIS — E785 Hyperlipidemia, unspecified: Secondary | ICD-10-CM | POA: Diagnosis not present

## 2019-10-04 DIAGNOSIS — I471 Supraventricular tachycardia: Secondary | ICD-10-CM | POA: Diagnosis not present

## 2019-10-04 DIAGNOSIS — K219 Gastro-esophageal reflux disease without esophagitis: Secondary | ICD-10-CM | POA: Diagnosis not present

## 2019-10-04 DIAGNOSIS — E039 Hypothyroidism, unspecified: Secondary | ICD-10-CM | POA: Diagnosis not present

## 2019-10-04 DIAGNOSIS — S22000A Wedge compression fracture of unspecified thoracic vertebra, initial encounter for closed fracture: Secondary | ICD-10-CM | POA: Diagnosis not present

## 2019-10-04 DIAGNOSIS — S22050A Wedge compression fracture of T5-T6 vertebra, initial encounter for closed fracture: Secondary | ICD-10-CM | POA: Diagnosis not present

## 2019-10-04 DIAGNOSIS — S22050D Wedge compression fracture of T5-T6 vertebra, subsequent encounter for fracture with routine healing: Secondary | ICD-10-CM | POA: Diagnosis not present

## 2019-10-04 DIAGNOSIS — S32020A Wedge compression fracture of second lumbar vertebra, initial encounter for closed fracture: Secondary | ICD-10-CM | POA: Diagnosis not present

## 2019-10-04 DIAGNOSIS — M25551 Pain in right hip: Secondary | ICD-10-CM | POA: Diagnosis not present

## 2019-10-04 DIAGNOSIS — R52 Pain, unspecified: Secondary | ICD-10-CM | POA: Diagnosis not present

## 2019-10-04 DIAGNOSIS — I110 Hypertensive heart disease with heart failure: Secondary | ICD-10-CM | POA: Diagnosis not present

## 2019-10-04 DIAGNOSIS — Z79899 Other long term (current) drug therapy: Secondary | ICD-10-CM | POA: Diagnosis not present

## 2019-10-04 DIAGNOSIS — M5489 Other dorsalgia: Secondary | ICD-10-CM | POA: Diagnosis not present

## 2019-10-04 DIAGNOSIS — S32020G Wedge compression fracture of second lumbar vertebra, subsequent encounter for fracture with delayed healing: Secondary | ICD-10-CM | POA: Diagnosis not present

## 2019-10-04 DIAGNOSIS — M545 Low back pain: Secondary | ICD-10-CM | POA: Diagnosis not present

## 2019-10-04 DIAGNOSIS — M2550 Pain in unspecified joint: Secondary | ICD-10-CM | POA: Diagnosis not present

## 2019-10-04 DIAGNOSIS — M546 Pain in thoracic spine: Secondary | ICD-10-CM | POA: Diagnosis not present

## 2019-10-04 DIAGNOSIS — M4802 Spinal stenosis, cervical region: Secondary | ICD-10-CM | POA: Diagnosis not present

## 2019-10-04 DIAGNOSIS — F419 Anxiety disorder, unspecified: Secondary | ICD-10-CM | POA: Diagnosis not present

## 2019-10-04 DIAGNOSIS — M6281 Muscle weakness (generalized): Secondary | ICD-10-CM | POA: Diagnosis not present

## 2019-10-04 DIAGNOSIS — I480 Paroxysmal atrial fibrillation: Secondary | ICD-10-CM | POA: Diagnosis not present

## 2019-10-04 DIAGNOSIS — Z20822 Contact with and (suspected) exposure to covid-19: Secondary | ICD-10-CM | POA: Diagnosis not present

## 2019-10-04 DIAGNOSIS — K588 Other irritable bowel syndrome: Secondary | ICD-10-CM | POA: Diagnosis not present

## 2019-10-04 DIAGNOSIS — R2681 Unsteadiness on feet: Secondary | ICD-10-CM | POA: Diagnosis not present

## 2019-10-04 DIAGNOSIS — Z7401 Bed confinement status: Secondary | ICD-10-CM | POA: Diagnosis not present

## 2019-10-04 DIAGNOSIS — R410 Disorientation, unspecified: Secondary | ICD-10-CM | POA: Diagnosis not present

## 2019-10-04 NOTE — ED Notes (Signed)
Pt noted to be sleeping, RR even and unlabored, pt appears to be in NAD at this time , RN will continue to monitor.

## 2019-10-04 NOTE — Discharge Instructions (Addendum)
You are being discharged to a skilled nursing facility. You have closed compression fractures of your L2, T5 and T12 vertebra. Please continue to take your pain medicine as prescribed. You are currently on opioid containing pain medicine. Discuss with your primary care/pain mgmt doctor about your pain levels.   Wear your brace for pain and for support.

## 2019-10-04 NOTE — ED Notes (Signed)
[facility] has arrived to pick up patient. Lattie Haw - granddaughter has been updated.

## 2019-10-04 NOTE — ED Notes (Signed)
Updated granddaughter via telephone

## 2019-10-04 NOTE — Progress Notes (Addendum)
3:05pm: CSW faxed patient's [facility]S to [facility].  2:30pm: Patient will go to room 209P at [facility]. Patient will be transported via [place], RN to call when ready. The number to call for report is [phone].  11:45am: CSW spoke with Lattie Haw who is interested in pursuing placement at [facility].  CSW spoke with Soy at [facility] who states she will reach out the patient's granddaughter to complete paperwork. Soy to return call to CSW once details are finalized.  11am: CSW spoke with patient's granddaughter Lattie Haw to present her with bed offers - Lattie Haw to review list and return call to CSW once a decision is made.  10am: Additional attempts were made to reach admissions staff at [facility] and [facility] without success.  7:55am: CSW attempted to reach Peggy at [facility] without success - a voicemail was left requesting a return call.  CSW attempted to reach [place] in admissions at [facility] without success - a voicemail was left requesting a return call.  Madilyn Fireman, MSW, LCSW-A Transitions of Care  Clinical Social Worker  [facility] Emergency Departments  Medical ICU [phone]

## 2019-10-05 DIAGNOSIS — K219 Gastro-esophageal reflux disease without esophagitis: Secondary | ICD-10-CM | POA: Diagnosis not present

## 2019-10-05 DIAGNOSIS — M4802 Spinal stenosis, cervical region: Secondary | ICD-10-CM | POA: Diagnosis not present

## 2019-10-05 DIAGNOSIS — K589 Irritable bowel syndrome without diarrhea: Secondary | ICD-10-CM | POA: Diagnosis not present

## 2019-10-05 DIAGNOSIS — E785 Hyperlipidemia, unspecified: Secondary | ICD-10-CM | POA: Diagnosis not present

## 2019-10-05 DIAGNOSIS — M545 Low back pain: Secondary | ICD-10-CM | POA: Diagnosis not present

## 2019-10-06 DIAGNOSIS — M4802 Spinal stenosis, cervical region: Secondary | ICD-10-CM | POA: Diagnosis not present

## 2019-10-06 DIAGNOSIS — F419 Anxiety disorder, unspecified: Secondary | ICD-10-CM | POA: Diagnosis not present

## 2019-10-06 DIAGNOSIS — E785 Hyperlipidemia, unspecified: Secondary | ICD-10-CM | POA: Diagnosis not present

## 2019-10-06 DIAGNOSIS — M545 Low back pain: Secondary | ICD-10-CM | POA: Diagnosis not present

## 2019-10-06 DIAGNOSIS — S22000A Wedge compression fracture of unspecified thoracic vertebra, initial encounter for closed fracture: Secondary | ICD-10-CM | POA: Diagnosis not present

## 2019-10-12 DIAGNOSIS — M4802 Spinal stenosis, cervical region: Secondary | ICD-10-CM | POA: Diagnosis not present

## 2019-10-12 DIAGNOSIS — I471 Supraventricular tachycardia: Secondary | ICD-10-CM | POA: Diagnosis not present

## 2019-10-12 DIAGNOSIS — F419 Anxiety disorder, unspecified: Secondary | ICD-10-CM | POA: Diagnosis not present

## 2019-10-12 DIAGNOSIS — S22000A Wedge compression fracture of unspecified thoracic vertebra, initial encounter for closed fracture: Secondary | ICD-10-CM | POA: Diagnosis not present

## 2019-10-12 DIAGNOSIS — K589 Irritable bowel syndrome without diarrhea: Secondary | ICD-10-CM | POA: Diagnosis not present

## 2019-10-17 ENCOUNTER — Ambulatory Visit: Payer: Medicare Other | Admitting: Internal Medicine

## 2019-10-18 DIAGNOSIS — S22050A Wedge compression fracture of T5-T6 vertebra, initial encounter for closed fracture: Secondary | ICD-10-CM | POA: Diagnosis not present

## 2019-10-18 DIAGNOSIS — S32020A Wedge compression fracture of second lumbar vertebra, initial encounter for closed fracture: Secondary | ICD-10-CM | POA: Diagnosis not present

## 2019-10-19 ENCOUNTER — Ambulatory Visit: Payer: Medicare Other | Admitting: Internal Medicine

## 2019-10-19 DIAGNOSIS — I471 Supraventricular tachycardia: Secondary | ICD-10-CM | POA: Diagnosis not present

## 2019-10-19 DIAGNOSIS — S22000A Wedge compression fracture of unspecified thoracic vertebra, initial encounter for closed fracture: Secondary | ICD-10-CM | POA: Diagnosis not present

## 2019-10-19 DIAGNOSIS — F419 Anxiety disorder, unspecified: Secondary | ICD-10-CM | POA: Diagnosis not present

## 2019-10-20 ENCOUNTER — Telehealth: Payer: Self-pay | Admitting: Internal Medicine

## 2019-10-20 NOTE — Telephone Encounter (Signed)
Insurance has been submitted and verified for Prolia. Patient is responsible for a $0 copay. Due anytime. Left message for patient to call back to schedule.  Okay to schedule... Visit Note: Prolia ($0 copay - okay to give per [place]) Visit Type: Nurse Provider: Nurse 

## 2019-10-26 DIAGNOSIS — K219 Gastro-esophageal reflux disease without esophagitis: Secondary | ICD-10-CM | POA: Diagnosis not present

## 2019-10-26 DIAGNOSIS — K589 Irritable bowel syndrome without diarrhea: Secondary | ICD-10-CM | POA: Diagnosis not present

## 2019-10-26 DIAGNOSIS — F419 Anxiety disorder, unspecified: Secondary | ICD-10-CM | POA: Diagnosis not present

## 2019-10-26 DIAGNOSIS — I471 Supraventricular tachycardia: Secondary | ICD-10-CM | POA: Diagnosis not present

## 2019-10-26 DIAGNOSIS — M4802 Spinal stenosis, cervical region: Secondary | ICD-10-CM | POA: Diagnosis not present

## 2019-10-30 DIAGNOSIS — Z79899 Other long term (current) drug therapy: Secondary | ICD-10-CM | POA: Diagnosis not present

## 2019-10-30 DIAGNOSIS — E785 Hyperlipidemia, unspecified: Secondary | ICD-10-CM | POA: Diagnosis not present

## 2019-11-01 DIAGNOSIS — S22050A Wedge compression fracture of T5-T6 vertebra, initial encounter for closed fracture: Secondary | ICD-10-CM | POA: Diagnosis not present

## 2019-11-02 ENCOUNTER — Telehealth: Payer: Self-pay | Admitting: Internal Medicine

## 2019-11-02 NOTE — Telephone Encounter (Signed)
Called pt daughter [name]) inform until pt see provider to follow-up rehab discharge. Dr. Alain Marion is out of the office until 10/21, and the pt is schedule to see him on 11/16/19.Marland KitchenJohny Chess

## 2019-11-02 NOTE — Telephone Encounter (Signed)
Patients daughter called and said that she was released from rehab yesterday and they told her to only take what was on the discharge papers and the daughter is concerned because none of the below medications are on the discharge papers and they told the patient to take what is on the papers only until she can see Dr. Alain [name]  atorvastatin (LIPITOR) 10 MG tablet  metoprolol succinate (TOPROL-XL) 50 MG 24 hr tablet B Complex-C (B-COMPLEX WITH VITAMIN C) tablet  doxycycline (VIBRA-TABS) 100 MG tablet anastrozole (ARIMIDEX) 1 MG tablet    Please call the patients daughter back: [phone]   Daughter is not on DPR.

## 2019-11-03 DIAGNOSIS — I4819 Other persistent atrial fibrillation: Secondary | ICD-10-CM | POA: Diagnosis not present

## 2019-11-03 DIAGNOSIS — S32020D Wedge compression fracture of second lumbar vertebra, subsequent encounter for fracture with routine healing: Secondary | ICD-10-CM | POA: Diagnosis not present

## 2019-11-03 DIAGNOSIS — Z7901 Long term (current) use of anticoagulants: Secondary | ICD-10-CM | POA: Diagnosis not present

## 2019-11-03 DIAGNOSIS — Z4801 Encounter for change or removal of surgical wound dressing: Secondary | ICD-10-CM | POA: Diagnosis not present

## 2019-11-03 DIAGNOSIS — S22050D Wedge compression fracture of T5-T6 vertebra, subsequent encounter for fracture with routine healing: Secondary | ICD-10-CM | POA: Diagnosis not present

## 2019-11-03 DIAGNOSIS — I5032 Chronic diastolic (congestive) heart failure: Secondary | ICD-10-CM | POA: Diagnosis not present

## 2019-11-03 DIAGNOSIS — K589 Irritable bowel syndrome without diarrhea: Secondary | ICD-10-CM | POA: Diagnosis not present

## 2019-11-03 DIAGNOSIS — M86662 Other chronic osteomyelitis, left tibia and fibula: Secondary | ICD-10-CM | POA: Diagnosis not present

## 2019-11-03 DIAGNOSIS — Z96641 Presence of right artificial hip joint: Secondary | ICD-10-CM | POA: Diagnosis not present

## 2019-11-03 DIAGNOSIS — Z483 Aftercare following surgery for neoplasm: Secondary | ICD-10-CM | POA: Diagnosis not present

## 2019-11-03 DIAGNOSIS — Z170 Estrogen receptor positive status [ER+]: Secondary | ICD-10-CM | POA: Diagnosis not present

## 2019-11-03 DIAGNOSIS — M5440 Lumbago with sciatica, unspecified side: Secondary | ICD-10-CM | POA: Diagnosis not present

## 2019-11-03 DIAGNOSIS — E785 Hyperlipidemia, unspecified: Secondary | ICD-10-CM | POA: Diagnosis not present

## 2019-11-03 DIAGNOSIS — I110 Hypertensive heart disease with heart failure: Secondary | ICD-10-CM | POA: Diagnosis not present

## 2019-11-03 DIAGNOSIS — Z79811 Long term (current) use of aromatase inhibitors: Secondary | ICD-10-CM | POA: Diagnosis not present

## 2019-11-03 DIAGNOSIS — S22080D Wedge compression fracture of T11-T12 vertebra, subsequent encounter for fracture with routine healing: Secondary | ICD-10-CM | POA: Diagnosis not present

## 2019-11-03 DIAGNOSIS — I872 Venous insufficiency (chronic) (peripheral): Secondary | ICD-10-CM | POA: Diagnosis not present

## 2019-11-03 DIAGNOSIS — M4726 Other spondylosis with radiculopathy, lumbar region: Secondary | ICD-10-CM | POA: Diagnosis not present

## 2019-11-03 DIAGNOSIS — M4802 Spinal stenosis, cervical region: Secondary | ICD-10-CM | POA: Diagnosis not present

## 2019-11-03 DIAGNOSIS — S32020G Wedge compression fracture of second lumbar vertebra, subsequent encounter for fracture with delayed healing: Secondary | ICD-10-CM | POA: Diagnosis not present

## 2019-11-03 DIAGNOSIS — I471 Supraventricular tachycardia: Secondary | ICD-10-CM | POA: Diagnosis not present

## 2019-11-03 DIAGNOSIS — F419 Anxiety disorder, unspecified: Secondary | ICD-10-CM | POA: Diagnosis not present

## 2019-11-03 DIAGNOSIS — C50512 Malignant neoplasm of lower-outer quadrant of left female breast: Secondary | ICD-10-CM | POA: Diagnosis not present

## 2019-11-03 DIAGNOSIS — E039 Hypothyroidism, unspecified: Secondary | ICD-10-CM | POA: Diagnosis not present

## 2019-11-03 DIAGNOSIS — M8589 Other specified disorders of bone density and structure, multiple sites: Secondary | ICD-10-CM | POA: Diagnosis not present

## 2019-11-03 DIAGNOSIS — Z981 Arthrodesis status: Secondary | ICD-10-CM | POA: Diagnosis not present

## 2019-11-07 DIAGNOSIS — S22050D Wedge compression fracture of T5-T6 vertebra, subsequent encounter for fracture with routine healing: Secondary | ICD-10-CM | POA: Diagnosis not present

## 2019-11-07 DIAGNOSIS — Z4801 Encounter for change or removal of surgical wound dressing: Secondary | ICD-10-CM | POA: Diagnosis not present

## 2019-11-07 DIAGNOSIS — S22080D Wedge compression fracture of T11-T12 vertebra, subsequent encounter for fracture with routine healing: Secondary | ICD-10-CM | POA: Diagnosis not present

## 2019-11-07 DIAGNOSIS — S32020G Wedge compression fracture of second lumbar vertebra, subsequent encounter for fracture with delayed healing: Secondary | ICD-10-CM | POA: Diagnosis not present

## 2019-11-07 DIAGNOSIS — M8589 Other specified disorders of bone density and structure, multiple sites: Secondary | ICD-10-CM | POA: Diagnosis not present

## 2019-11-07 DIAGNOSIS — Z483 Aftercare following surgery for neoplasm: Secondary | ICD-10-CM | POA: Diagnosis not present

## 2019-11-08 DIAGNOSIS — S22080D Wedge compression fracture of T11-T12 vertebra, subsequent encounter for fracture with routine healing: Secondary | ICD-10-CM | POA: Diagnosis not present

## 2019-11-08 DIAGNOSIS — M8589 Other specified disorders of bone density and structure, multiple sites: Secondary | ICD-10-CM | POA: Diagnosis not present

## 2019-11-08 DIAGNOSIS — Z4801 Encounter for change or removal of surgical wound dressing: Secondary | ICD-10-CM | POA: Diagnosis not present

## 2019-11-08 DIAGNOSIS — S32020G Wedge compression fracture of second lumbar vertebra, subsequent encounter for fracture with delayed healing: Secondary | ICD-10-CM | POA: Diagnosis not present

## 2019-11-08 DIAGNOSIS — S22050D Wedge compression fracture of T5-T6 vertebra, subsequent encounter for fracture with routine healing: Secondary | ICD-10-CM | POA: Diagnosis not present

## 2019-11-08 DIAGNOSIS — Z483 Aftercare following surgery for neoplasm: Secondary | ICD-10-CM | POA: Diagnosis not present

## 2019-11-10 DIAGNOSIS — Z4801 Encounter for change or removal of surgical wound dressing: Secondary | ICD-10-CM | POA: Diagnosis not present

## 2019-11-10 DIAGNOSIS — S22080D Wedge compression fracture of T11-T12 vertebra, subsequent encounter for fracture with routine healing: Secondary | ICD-10-CM | POA: Diagnosis not present

## 2019-11-10 DIAGNOSIS — Z483 Aftercare following surgery for neoplasm: Secondary | ICD-10-CM | POA: Diagnosis not present

## 2019-11-10 DIAGNOSIS — S22050D Wedge compression fracture of T5-T6 vertebra, subsequent encounter for fracture with routine healing: Secondary | ICD-10-CM | POA: Diagnosis not present

## 2019-11-10 DIAGNOSIS — S32020G Wedge compression fracture of second lumbar vertebra, subsequent encounter for fracture with delayed healing: Secondary | ICD-10-CM | POA: Diagnosis not present

## 2019-11-10 DIAGNOSIS — M8589 Other specified disorders of bone density and structure, multiple sites: Secondary | ICD-10-CM | POA: Diagnosis not present

## 2019-11-11 DIAGNOSIS — S32020G Wedge compression fracture of second lumbar vertebra, subsequent encounter for fracture with delayed healing: Secondary | ICD-10-CM | POA: Diagnosis not present

## 2019-11-11 DIAGNOSIS — Z4801 Encounter for change or removal of surgical wound dressing: Secondary | ICD-10-CM | POA: Diagnosis not present

## 2019-11-11 DIAGNOSIS — S22050D Wedge compression fracture of T5-T6 vertebra, subsequent encounter for fracture with routine healing: Secondary | ICD-10-CM | POA: Diagnosis not present

## 2019-11-11 DIAGNOSIS — S22080D Wedge compression fracture of T11-T12 vertebra, subsequent encounter for fracture with routine healing: Secondary | ICD-10-CM | POA: Diagnosis not present

## 2019-11-11 DIAGNOSIS — M8589 Other specified disorders of bone density and structure, multiple sites: Secondary | ICD-10-CM | POA: Diagnosis not present

## 2019-11-11 DIAGNOSIS — Z483 Aftercare following surgery for neoplasm: Secondary | ICD-10-CM | POA: Diagnosis not present

## 2019-11-14 ENCOUNTER — Emergency Department (HOSPITAL_COMMUNITY): Payer: Medicare Other

## 2019-11-14 ENCOUNTER — Encounter (HOSPITAL_COMMUNITY): Payer: Self-pay | Admitting: Pharmacy Technician

## 2019-11-14 ENCOUNTER — Emergency Department (HOSPITAL_COMMUNITY)
Admission: EM | Admit: 2019-11-14 | Discharge: 2019-11-14 | Disposition: A | Discharge status: Home / Self Care | Payer: Medicare Other | Attending: Emergency Medicine | Admitting: Emergency Medicine

## 2019-11-14 ENCOUNTER — Other Ambulatory Visit: Payer: Self-pay

## 2019-11-14 DIAGNOSIS — Z96641 Presence of right artificial hip joint: Secondary | ICD-10-CM | POA: Diagnosis not present

## 2019-11-14 DIAGNOSIS — R52 Pain, unspecified: Secondary | ICD-10-CM | POA: Diagnosis not present

## 2019-11-14 DIAGNOSIS — I5032 Chronic diastolic (congestive) heart failure: Secondary | ICD-10-CM | POA: Insufficient documentation

## 2019-11-14 DIAGNOSIS — S22050D Wedge compression fracture of T5-T6 vertebra, subsequent encounter for fracture with routine healing: Secondary | ICD-10-CM | POA: Diagnosis not present

## 2019-11-14 DIAGNOSIS — M25551 Pain in right hip: Secondary | ICD-10-CM

## 2019-11-14 DIAGNOSIS — M8589 Other specified disorders of bone density and structure, multiple sites: Secondary | ICD-10-CM | POA: Diagnosis not present

## 2019-11-14 DIAGNOSIS — S22080D Wedge compression fracture of T11-T12 vertebra, subsequent encounter for fracture with routine healing: Secondary | ICD-10-CM | POA: Diagnosis not present

## 2019-11-14 DIAGNOSIS — I110 Hypertensive heart disease with heart failure: Secondary | ICD-10-CM | POA: Diagnosis not present

## 2019-11-14 DIAGNOSIS — Z7401 Bed confinement status: Secondary | ICD-10-CM | POA: Diagnosis not present

## 2019-11-14 DIAGNOSIS — Z853 Personal history of malignant neoplasm of breast: Secondary | ICD-10-CM | POA: Diagnosis not present

## 2019-11-14 DIAGNOSIS — Z483 Aftercare following surgery for neoplasm: Secondary | ICD-10-CM | POA: Diagnosis not present

## 2019-11-14 DIAGNOSIS — E039 Hypothyroidism, unspecified: Secondary | ICD-10-CM | POA: Insufficient documentation

## 2019-11-14 DIAGNOSIS — Z7901 Long term (current) use of anticoagulants: Secondary | ICD-10-CM | POA: Insufficient documentation

## 2019-11-14 DIAGNOSIS — M25572 Pain in left ankle and joints of left foot: Secondary | ICD-10-CM | POA: Diagnosis not present

## 2019-11-14 DIAGNOSIS — M2550 Pain in unspecified joint: Secondary | ICD-10-CM | POA: Diagnosis not present

## 2019-11-14 DIAGNOSIS — Z23 Encounter for immunization: Secondary | ICD-10-CM | POA: Diagnosis not present

## 2019-11-14 DIAGNOSIS — S32020G Wedge compression fracture of second lumbar vertebra, subsequent encounter for fracture with delayed healing: Secondary | ICD-10-CM | POA: Diagnosis not present

## 2019-11-14 DIAGNOSIS — Z79899 Other long term (current) drug therapy: Secondary | ICD-10-CM | POA: Insufficient documentation

## 2019-11-14 DIAGNOSIS — I10 Essential (primary) hypertension: Secondary | ICD-10-CM | POA: Diagnosis not present

## 2019-11-14 DIAGNOSIS — R0902 Hypoxemia: Secondary | ICD-10-CM | POA: Diagnosis not present

## 2019-11-14 DIAGNOSIS — Z4801 Encounter for change or removal of surgical wound dressing: Secondary | ICD-10-CM | POA: Diagnosis not present

## 2019-11-14 MED ORDER — DICLOFENAC SODIUM 1 % EX GEL
4.0000 g | Freq: Once | CUTANEOUS | Status: AC
  Administered 2019-11-14: 09:00:00 4 g via TOPICAL
  Filled 2019-11-14: qty 100

## 2019-11-14 MED ORDER — ACETAMINOPHEN 325 MG PO TABS
650.0000 mg | ORAL_TABLET | Freq: Once | ORAL | Status: AC
  Administered 2019-11-14: 650 mg via ORAL
  Filled 2019-11-14: qty 2

## 2019-11-14 NOTE — ED Provider Notes (Addendum)
[facility] EMERGENCY DEPARTMENT Provider Note   CSN: [id number] Arrival date & time: 11/14/19  2992     History Chief Complaint  Patient presents with  . Hip Pain    [name] is a 86 y.o. female.  Patient reports being out of rehab and back in her independent living facility for about a week.  She was in rehab for a fall about 6 weeks ago with spine compression fractures.  She is still wearing a brace when out of bed.  She reports since returning to her independent living facility, she has had gradually worsening right hip pain that is worse when she walks.  The history is provided by the patient.  Hip Pain This is a new problem. Episode onset: about 1 week ago. The problem occurs constantly. The problem has been gradually worsening. Pertinent negatives include no chest pain, no abdominal pain and no shortness of breath. The symptoms are aggravated by walking. Nothing relieves the symptoms. She has tried nothing for the symptoms.       Past Medical History:  Diagnosis Date  . Anxiety    denies  . Atrial flutter ([place])   . Bilateral edema of lower extremity   . Breast cancer ([place])    left breast  . Chronic constipation   . Chronic diastolic CHF (congestive heart failure) ([place])   . Diverticulosis of colon   . GERD (gastroesophageal reflux disease)   . H/O hiatal hernia   . History of cellulitis    LEFT LOWER LEG --  SEPT 2015  . History of colitis    DX  2003  WITH ISCHEMIC COLITIS  . History of diverticulitis of colon    perferated diverticulitis w/ abscess 08-25-2013 drain placed --  resolved without surgical intervention  . History of GI bleed    2011--- UPPER SECONARY GASTRIC ULCER FROM NSAID USE  . History of Helicobacter pylori infection    2011  . History of ulcer of lower limb    2012   RIGHT LOWER EXTREMITIY--  STATSIS ULCER  . Hyperlipidemia   . Hypertension   . Hypothyroidism   . Iron deficiency anemia 2011   elevated MCV  . LBP  (low back pain)    severe lumbar spondylosis s/p L3/L4,L4/L5 fusion 12/10  . Lumbar spondylolysis   . OA (osteoarthritis)    "hands" (08/25/2013)  . Open wound of left lower leg   . Osteopenia   . Persistent atrial fibrillation ([place])    CARDIOLOGIST-   DR MCALANY  . RBBB   . Urinary frequency     Patient Active Problem List   Diagnosis Date Noted  . Acute bilateral low back pain with bilateral sciatica 09/28/2019  . Compression fracture of T12 vertebra ([place]) 09/28/2019  . Chronic pain of both shoulders 09/04/2019  . Thoracic spine pain 09/01/2019  . Skin cancer 06/16/2019  . Atrial fibrillation ([place]) 03/24/2019  . Memory loss 11/15/2018  . Herpes zoster 10/07/2018  . Chronic osteomyelitis of left tibia ([place]) 05/23/2017  . Community acquired pneumonia of left lung   . Osteoporosis 08/22/2016  . Malignant neoplasm of upper-outer quadrant of left breast in female, estrogen receptor positive ([place]) 08/10/2015  . Breast mass in female 07/25/2015  . Left breast mass 07/05/2015  . Incontinence in female 07/05/2014  . Edema 01/17/2014  . Morbilliform rash 09/06/2013  . Sepsis ([place]) 08/25/2013  . Metabolic acidosis 42/68/3419  . Diverticulitis of colon with perforation 08/25/2013  . Tibia/fibula fracture,  shaft, left, open type I or II, with routine healing, subsequent encounter 06/28/2013  . Constipation 06/22/2013  . Open tibial fracture 06/17/2013  . Encounter for therapeutic drug monitoring 02/16/2013  . Well adult exam 07/21/2012  . Paresthesia 01/28/2012  . Chronic venous insufficiency 11/11/2010  . Actinic keratoses 05/30/2010  . Long term current use of anticoagulant 03/12/2010  . PEPTIC ULCER DISEASE 11/22/2009  . Iron deficiency anemia 10/31/2009  . HIP PAIN 10/31/2009  . Hypothyroidism 10/25/2009  . Disorder resulting from impaired renal function 03/19/2009  . INSOMNIA, PERSISTENT 11/07/2008  . Low back pain 09/04/2008  . Atrial flutter ([place]) 05/31/2008  . Essential  hypertension 11/30/2006  . Osteoarthritis 11/30/2006  . Disorder of bone and cartilage 11/30/2006    Past Surgical History:  Procedure Laterality Date  . APPLICATION OF A-CELL OF EXTREMITY Left 06/21/2013   Procedure: APPLICATION OF A-CELL OF EXTREMITY;  Surgeon: Theodoro Kos, DO;  Location: [place];  Service: Plastics;  Laterality: Left;  . APPLICATION OF A-CELL OF EXTREMITY Left 08/22/2013   Procedure: APPLICATION OF A-CELL/VAC TO LOWER LEFT LEG WOUND;  Surgeon: Theodoro Kos, DO;  Location: [place];  Service: Plastics;  Laterality: Left;  . APPLICATION OF A-CELL OF EXTREMITY Left 02/06/2014   Procedure: WITH PLACEMENT OF A -CELL ;  Surgeon: Theodoro Kos, DO;  Location: [place];  Service: Plastics;  Laterality: Left;  . APPLICATION OF A-CELL OF EXTREMITY Left 08/09/2014   Procedure: PLACEMENT OF A CELL;  Surgeon: Theodoro Kos, DO;  Location: [place];  Service: Plastics;  Laterality: Left;  . APPLICATION OF WOUND VAC Left 06/16/2013   Procedure: APPLICATION OF WOUND VAC;  Surgeon: Meredith Pel, MD;  Location: [place];  Service: Orthopedics;  Laterality: Left;  . APPLICATION OF WOUND VAC Left 06/21/2013   Procedure: APPLICATION OF WOUND VAC;  Surgeon: Theodoro Kos, DO;  Location: [place];  Service: Plastics;  Laterality: Left;  . BIOPSY BREAST  07/25/2015   LEFT RADIOACTIVE SEED GUIDED EXCISIONAL BREAST BIOPSY (Left) as a surgical intervention  . EXTERNAL FIXATION LEG Left 06/16/2013   Procedure: EXTERNAL FIXATION LEG;  Surgeon: Meredith Pel, MD;  Location: [place];  Service: Orthopedics;  Laterality: Left;  . EXTERNAL FIXATION REMOVAL Left 06/21/2013   Procedure: REMOVAL EXTERNAL FIXATION LEG;  Surgeon: Meredith Pel, MD;  Location: [place];  Service: Orthopedics;  Laterality: Left;  . EYE SURGERY Bilateral    cataract removal  . HARDWARE REMOVAL Left 05/23/2014   Procedure: HARDWARE REMOVAL;  Surgeon: Meredith Pel, MD;  Location: [place];  Service:  Orthopedics;  Laterality: Left;  REMOVAL OF HARDWARE LEFT TIBIA  . I & D EXTREMITY Left 06/16/2013   Procedure: IRRIGATION AND DEBRIDEMENT EXTREMITY;  Surgeon: Meredith Pel, MD;  Location: [place];  Service: Orthopedics;  Laterality: Left;  . I & D EXTREMITY Left 02/06/2014   Procedure: IRRIGATION AND DEBRIDEMENT LEFT LEG WOUND ;  Surgeon: Theodoro Kos, DO;  Location: [place];  Service: Plastics;  Laterality: Left;  . I & D EXTREMITY Left 08/09/2014   Procedure: IRRIGATION AND DEBRIDEMENT  OF LEFT LOWER LEG;  Surgeon: Theodoro Kos, DO;  Location: [place];  Service: Plastics;  Laterality: Left;  . LUMBAR FUSION  01-02-2009   L3-L5  . RADIOACTIVE SEED GUIDED EXCISIONAL BREAST BIOPSY Left 07/25/2015   Procedure: LEFT RADIOACTIVE SEED GUIDED EXCISIONAL BREAST BIOPSY;  Surgeon: Rolm Bookbinder, MD;  Location: [place];  Service: General;  Laterality: Left;  .  RE-EXCISION OF BREAST LUMPECTOMY Left 09/05/2015   Procedure: RE-EXCISION OF LEFT BREAST LUMPECTOMY;  Surgeon: Rolm Bookbinder, MD;  Location: [place];  Service: General;  Laterality: Left;  . SHOULDER OPEN ROTATOR CUFF REPAIR Left 1997  . TIBIA IM NAIL INSERTION Left 06/21/2013   Procedure: INTRAMEDULLARY (IM) NAIL TIBIAL;  Surgeon: Meredith Pel, MD;  Location: [place];  Service: Orthopedics;  Laterality: Left;  . TONSILLECTOMY  AS CHILD  . TOTAL HIP ARTHROPLASTY Right 06-07-2010  . TRANSTHORACIC ECHOCARDIOGRAM  09-11-2010   DR MCALHANY   LVSF  55-60%/  MILD MV CALCIFICATION WITH NO STENOSIS/  MILD MR & TR / MODERATE LAE/  MODERATE TO SEVERE RAE  . UPPER GI ENDOSCOPY  03-29-2010     OB History   No obstetric history on file.     Family History  Problem Relation Age of Onset  . Cancer Father   . Hypertension Other     Social History   Tobacco Use  . Smoking status: Never Smoker  . Smokeless tobacco: Never Used  Vaping Use  . Vaping Use: Never used  Substance Use Topics  . Alcohol use: No    Comment: 1  glass occ wine 08/03/14- none in a year  . Drug use: No    Home Medications Prior to Admission medications   Medication Sig Start Date End Date Taking? Authorizing Provider  apixaban (ELIQUIS) 5 MG TABS tablet Take 1 tablet (5 mg total) by mouth 2 (two) times daily. 06/14/19  Yes Burnell Blanks, MD  atorvastatin (LIPITOR) 10 MG tablet Take 0.5 tablets (5 mg total) by mouth every other day. 09/15/19  Yes [name], [name], MD  busPIRone (BUSPAR) 15 MG tablet Take 1 tablet (15 mg total) by mouth 2 (two) times daily. 09/15/19  Yes [name], [name], MD  Cholecalciferol (VITAMIN D3) 1000 UNITS tablet Take 1,000 Units by mouth daily.     Yes [provider]  acetaminophen (TYLENOL) 500 MG tablet Take 500 mg by mouth daily as needed for mild pain.     [provider]  anastrozole (ARIMIDEX) 1 MG tablet Take 1 tablet (1 mg total) by mouth daily. 09/08/19   Magrinat, Virgie Dad, MD  B Complex-C (B-COMPLEX WITH VITAMIN C) tablet Take 1 tablet by mouth daily.    [provider]  diltiazem (CARDIZEM CD) 120 MG 24 hr capsule TAKE 1 CAPSULE (120 MG TOTAL) BY MOUTH DAILY. Patient taking differently: Take 120 mg by mouth daily.  02/15/19   [name], [name], MD  doxycycline (VIBRA-TABS) 100 MG tablet Take 1 tablet (100 mg total) by mouth 2 (two) times daily. 02/15/19   [name], [name], MD  famotidine (PEPCID) 20 MG tablet Take 20 mg by mouth daily.    [provider]  gabapentin (NEURONTIN) 100 MG capsule Take 1 capsule (100 mg total) by mouth 4 (four) times daily. Pain 09/15/19   [name], [name], MD  HYDROcodone-acetaminophen (NORCO) 10-325 MG tablet Take 1 tablet by mouth every 6 (six) hours as needed. 09/28/19   Janith Lima, MD  levothyroxine (SYNTHROID) 25 MCG tablet TAKE 1 TABLET BY MOUTH DAILY FOR THYROID. 08/29/19   [name], [name], MD  linaclotide (LINZESS) 290 MCG CAPS capsule Take 1 capsule (290 mcg total) by mouth daily. 01/06/19    [name], [name], MD  methylPREDNISolone (MEDROL DOSEPAK) 4 MG TBPK tablet As directed Patient not taking: Reported on 10/04/2019 09/01/19   [name], [name], MD  metoprolol succinate (TOPROL-XL) 50  MG 24 hr tablet Take 1 tablet (50 mg total) by mouth 2 (two) times daily. Take with or immediately following a meal. 02/15/19   [name], [name], MD  Multiple Vitamins-Minerals (MULTIVITAMIN WITH MINERALS) tablet Take 1 tablet by mouth daily.    [provider]  polyethylene glycol (MIRALAX) 17 g packet Take 17 g by mouth daily. 10/15/18   Maudie Flakes, MD  promethazine (PHENERGAN) 12.5 MG tablet Take 1 tablet (12.5 mg total) by mouth every 6 (six) hours as needed for nausea or vomiting. 09/28/19   Janith Lima, MD  temazepam (RESTORIL) 15 MG capsule Take 1 capsule (15 mg total) by mouth at bedtime as needed for sleep. 09/14/19   [name], [name], MD  vitamin C (ASCORBIC ACID) 500 MG tablet Take 500 mg by mouth daily.    [provider]    Allergies    Zosyn [piperacillin sod-tazobactam so], Atenolol, Clarithromycin, Codeine sulfate, Levaquin [levofloxacin], Macrodantin, Oxycodone-acetaminophen, and Percocet [oxycodone-acetaminophen]  Review of Systems   Review of Systems  Constitutional: Negative for chills and fever.  HENT: Negative for ear pain and sore throat.   Eyes: Negative for pain and visual disturbance.  Respiratory: Negative for cough and shortness of breath.   Cardiovascular: Negative for chest pain and palpitations.  Gastrointestinal: Negative for abdominal pain and vomiting.  Genitourinary: Negative for dysuria and hematuria.  Musculoskeletal: Positive for back pain. Negative for arthralgias.  Skin: Negative for color change and rash.  Neurological: Negative for seizures and syncope.  All other systems reviewed and are negative.   Physical Exam Updated Vital Signs BP (!) 146/80   Pulse 88   Temp 98.4 F (36.9 C) (Oral)   Resp 18   SpO2 93%    Physical Exam Vitals and nursing note reviewed.  Constitutional:      Appearance: She is well-developed. She is not ill-appearing, toxic-appearing or diaphoretic.  HENT:     Head: Normocephalic and atraumatic.     Mouth/Throat:     Mouth: Mucous membranes are moist.     Pharynx: Oropharynx is clear.  Eyes:     Conjunctiva/sclera: Conjunctivae normal.     Pupils: Pupils are equal, round, and reactive to light.  Cardiovascular:     Rate and Rhythm: Normal rate and regular rhythm.     Heart sounds: No murmur heard.  No gallop.   Pulmonary:     Effort: Pulmonary effort is normal. No respiratory distress.     Breath sounds: Normal breath sounds.  Abdominal:     Palpations: Abdomen is soft.     Tenderness: There is no abdominal tenderness.  Musculoskeletal:     Cervical back: Neck supple.     Right hip: Tenderness present. No deformity. Normal range of motion. Normal strength.     Comments: R hip with some pain on active ROM with mild reduction in ROM but minimal pain with passive ROM. No pain with axial load.   Skin:    General: Skin is warm and dry.  Neurological:     Mental Status: She is alert.     ED Results / Procedures / Treatments   Labs (all labs ordered are listed, but only abnormal results are displayed) Labs Reviewed - No data to display  EKG None  Radiology CT Hip Right Wo Contrast  Result Date: 11/14/2019 CLINICAL DATA:  Right hip pain EXAM: CT OF THE RIGHT HIP WITHOUT CONTRAST TECHNIQUE: Multidetector CT imaging of the right hip was performed according to the  standard protocol. Multiplanar CT image reconstructions were also generated. COMPARISON:  X-ray 11/14/2019 FINDINGS: Bones/Joint/Cartilage Status post right total hip arthroplasty. Arthroplasty components are in their expected alignment. No periprosthetic fracture is identified. No abnormal periprosthetic lucency to suggest hardware loosening. No periprosthetic fluid collection is seen. The visualized  portion of the bony right hemipelvis is intact. There is arthropathy of the right SI joint and pubic symphysis. Ligaments Suboptimally assessed by CT. Muscles and Tendons There is some atrophy within the right gluteus medius and minimus muscle. No acute musculotendinous abnormality by CT. Soft tissues No soft tissue fluid collection or hematoma. Diverticulosis is seen within the visualized portion of the sigmoid colon. IMPRESSION: Status post right total hip arthroplasty without periprosthetic fracture or evidence of hardware complication. Electronically Signed   By: Davina Poke D.O.   On: 11/14/2019 09:41   DG Hip Unilat W or Wo Pelvis 2-3 Views Right  Result Date: 11/14/2019 CLINICAL DATA:  [place] 6 weeks ago.  Right hip pain. EXAM: DG HIP (WITH OR WITHOUT PELVIS) 2-3V RIGHT COMPARISON:  10/03/2019 FINDINGS: Previous right hip replacement continues to have a good appearance. No evidence of regional fracture or hardware complication. Other bones of the pelvis appear normal. IMPRESSION: No acute or traumatic finding. Previous right hip replacement. Electronically Signed   By: Nelson Chimes M.D.   On: 11/14/2019 08:31    Procedures Procedures (including critical care time)  Medications Ordered in ED Medications  acetaminophen (TYLENOL) tablet 650 mg (650 mg Oral Given 11/14/19 0900)  diclofenac Sodium (VOLTAREN) 1 % topical gel 4 g (4 g Topical Given 11/14/19 9381)    ED Course  I have reviewed the triage vital signs and the nursing notes.  Pertinent labs & imaging results that were available during my care of the patient were reviewed by me and considered in my medical decision making (see chart for details).    MDM Rules/Calculators/A&P                          The patient is a 86yo female, PMH recent fall with spine compression fractures, R hip replacement, who presents to the ED via EMS from independent living facility for R hip pain.  On my initial evaluation, the patient is  hemodynamically stable, afebrile, nontoxic-appearing. Physical exam remarkable for R hip tenderness but distally neurovascularly intact.  Differentials considered include occult pelvis fracture, periprosthetic fracture, overuse due to transition back to independent living facility. I am most concerned for MSK pain from overuse with being back in her independent living facility.   Patient provided PO tylenol for pain. X-ray hip remarkable for no obvious fractures. Due to osteoporosis and R hip replacement, obtained CT R hip to rule out occult periprosthetic fracture.  CT hip unremarkable.  Advised patient of concern for benign MSK pain due to overuse and transitioning back to living at home.  Advised treatment of symptoms with Tylenol and Motrin every 6 hours as needed for pain.  Recommended follow-up with PCP.  Spoke with patient about talking with case management to see if she can be upgraded to assisted living.  However, patient is adamant about returning home to her independent living facility where she lives with her husband.  She declines any assistance with case management.  Cannot ambulate patient prior to discharge due to not having her back brace but patient is still adamant about going home.  Patient discharged in stable condition with strict return precautions provided.  EMS  to provide transport so patient may remain in bed for transport since her brace is not here.   The care of this patient was overseen by Dr. Maryan Rued, who agreed with evaluation and plan of care.   Final Clinical Impression(s) / ED Diagnoses Final diagnoses:  Right hip pain    Rx / DC Orders ED Discharge Orders    None          Launa Flight, MD 11/14/19 1010    Blanchie Dessert, MD 11/17/19 2214

## 2019-11-14 NOTE — ED Triage Notes (Signed)
Pt here via ems from [facility] with reports of R hip pain. Fall approx 6 weeks ago with back fx per ems. Pt has had decreased mobility for the last 3 days due to increased pain in R hip.

## 2019-11-16 ENCOUNTER — Ambulatory Visit: Payer: Medicare Other | Admitting: Internal Medicine

## 2019-11-17 ENCOUNTER — Telehealth: Payer: Self-pay | Admitting: Internal Medicine

## 2019-11-17 DIAGNOSIS — S32020G Wedge compression fracture of second lumbar vertebra, subsequent encounter for fracture with delayed healing: Secondary | ICD-10-CM | POA: Diagnosis not present

## 2019-11-17 DIAGNOSIS — M8589 Other specified disorders of bone density and structure, multiple sites: Secondary | ICD-10-CM | POA: Diagnosis not present

## 2019-11-17 DIAGNOSIS — S22080D Wedge compression fracture of T11-T12 vertebra, subsequent encounter for fracture with routine healing: Secondary | ICD-10-CM | POA: Diagnosis not present

## 2019-11-17 DIAGNOSIS — S22050D Wedge compression fracture of T5-T6 vertebra, subsequent encounter for fracture with routine healing: Secondary | ICD-10-CM | POA: Diagnosis not present

## 2019-11-17 DIAGNOSIS — Z483 Aftercare following surgery for neoplasm: Secondary | ICD-10-CM | POA: Diagnosis not present

## 2019-11-17 DIAGNOSIS — Z4801 Encounter for change or removal of surgical wound dressing: Secondary | ICD-10-CM | POA: Diagnosis not present

## 2019-11-17 NOTE — Telephone Encounter (Signed)
  Anissa from [facility] calling stating patient has [place] twice a week for the wound on her leg from where she got a spot removed at Dr. Franne Forts office, they attempted to reach out to Dr. Dian Situ about orders but because she hasnt been seen there since august they will not do orders for her and the patient is not content with the current treatment and they wanted to know if it could be changed to the vasoline gauze for the wound Anissa# [phone]

## 2019-11-18 DIAGNOSIS — Z4801 Encounter for change or removal of surgical wound dressing: Secondary | ICD-10-CM | POA: Diagnosis not present

## 2019-11-18 DIAGNOSIS — S22080D Wedge compression fracture of T11-T12 vertebra, subsequent encounter for fracture with routine healing: Secondary | ICD-10-CM | POA: Diagnosis not present

## 2019-11-18 DIAGNOSIS — S32020G Wedge compression fracture of second lumbar vertebra, subsequent encounter for fracture with delayed healing: Secondary | ICD-10-CM | POA: Diagnosis not present

## 2019-11-18 DIAGNOSIS — M8589 Other specified disorders of bone density and structure, multiple sites: Secondary | ICD-10-CM | POA: Diagnosis not present

## 2019-11-18 DIAGNOSIS — Z483 Aftercare following surgery for neoplasm: Secondary | ICD-10-CM | POA: Diagnosis not present

## 2019-11-18 DIAGNOSIS — S22050D Wedge compression fracture of T5-T6 vertebra, subsequent encounter for fracture with routine healing: Secondary | ICD-10-CM | POA: Diagnosis not present

## 2019-11-18 NOTE — Telephone Encounter (Signed)
Ok. Thanks!

## 2019-11-18 NOTE — Telephone Encounter (Signed)
Notified Anissa w/MD response.Marland KitchenJohny Chess

## 2019-11-21 ENCOUNTER — Telehealth: Payer: Self-pay | Admitting: Internal Medicine

## 2019-11-21 DIAGNOSIS — S22080D Wedge compression fracture of T11-T12 vertebra, subsequent encounter for fracture with routine healing: Secondary | ICD-10-CM | POA: Diagnosis not present

## 2019-11-21 DIAGNOSIS — M8589 Other specified disorders of bone density and structure, multiple sites: Secondary | ICD-10-CM | POA: Diagnosis not present

## 2019-11-21 DIAGNOSIS — S32020G Wedge compression fracture of second lumbar vertebra, subsequent encounter for fracture with delayed healing: Secondary | ICD-10-CM | POA: Diagnosis not present

## 2019-11-21 DIAGNOSIS — S22050D Wedge compression fracture of T5-T6 vertebra, subsequent encounter for fracture with routine healing: Secondary | ICD-10-CM | POA: Diagnosis not present

## 2019-11-21 DIAGNOSIS — Z4801 Encounter for change or removal of surgical wound dressing: Secondary | ICD-10-CM | POA: Diagnosis not present

## 2019-11-21 DIAGNOSIS — Z483 Aftercare following surgery for neoplasm: Secondary | ICD-10-CM | POA: Diagnosis not present

## 2019-11-21 NOTE — Telephone Encounter (Signed)
LVM for pt to rtn my call to schedule AWV with [facility]. Please schedule appt if patient calls the office.

## 2019-11-22 DIAGNOSIS — S22050D Wedge compression fracture of T5-T6 vertebra, subsequent encounter for fracture with routine healing: Secondary | ICD-10-CM | POA: Diagnosis not present

## 2019-11-22 DIAGNOSIS — Z483 Aftercare following surgery for neoplasm: Secondary | ICD-10-CM | POA: Diagnosis not present

## 2019-11-22 DIAGNOSIS — S22080D Wedge compression fracture of T11-T12 vertebra, subsequent encounter for fracture with routine healing: Secondary | ICD-10-CM | POA: Diagnosis not present

## 2019-11-22 DIAGNOSIS — Z4801 Encounter for change or removal of surgical wound dressing: Secondary | ICD-10-CM | POA: Diagnosis not present

## 2019-11-22 DIAGNOSIS — S32020G Wedge compression fracture of second lumbar vertebra, subsequent encounter for fracture with delayed healing: Secondary | ICD-10-CM | POA: Diagnosis not present

## 2019-11-22 DIAGNOSIS — M8589 Other specified disorders of bone density and structure, multiple sites: Secondary | ICD-10-CM | POA: Diagnosis not present

## 2019-11-24 DIAGNOSIS — S22050A Wedge compression fracture of T5-T6 vertebra, initial encounter for closed fracture: Secondary | ICD-10-CM | POA: Diagnosis not present

## 2019-11-24 DIAGNOSIS — I10 Essential (primary) hypertension: Secondary | ICD-10-CM | POA: Diagnosis not present

## 2019-11-25 DIAGNOSIS — S32020G Wedge compression fracture of second lumbar vertebra, subsequent encounter for fracture with delayed healing: Secondary | ICD-10-CM | POA: Diagnosis not present

## 2019-11-25 DIAGNOSIS — S22050D Wedge compression fracture of T5-T6 vertebra, subsequent encounter for fracture with routine healing: Secondary | ICD-10-CM | POA: Diagnosis not present

## 2019-11-25 DIAGNOSIS — Z4801 Encounter for change or removal of surgical wound dressing: Secondary | ICD-10-CM | POA: Diagnosis not present

## 2019-11-25 DIAGNOSIS — S22080D Wedge compression fracture of T11-T12 vertebra, subsequent encounter for fracture with routine healing: Secondary | ICD-10-CM | POA: Diagnosis not present

## 2019-11-25 DIAGNOSIS — Z483 Aftercare following surgery for neoplasm: Secondary | ICD-10-CM | POA: Diagnosis not present

## 2019-11-25 DIAGNOSIS — M8589 Other specified disorders of bone density and structure, multiple sites: Secondary | ICD-10-CM | POA: Diagnosis not present

## 2019-11-28 ENCOUNTER — Other Ambulatory Visit: Payer: Self-pay

## 2019-11-28 ENCOUNTER — Ambulatory Visit (INDEPENDENT_AMBULATORY_CARE_PROVIDER_SITE_OTHER): Payer: Medicare Other | Admitting: Internal Medicine

## 2019-11-28 ENCOUNTER — Encounter: Payer: Self-pay | Admitting: Internal Medicine

## 2019-11-28 VITALS — BP 134/80 | HR 82 | Temp 99.4°F | Wt 165.0 lb

## 2019-11-28 DIAGNOSIS — S22080D Wedge compression fracture of T11-T12 vertebra, subsequent encounter for fracture with routine healing: Secondary | ICD-10-CM | POA: Diagnosis not present

## 2019-11-28 DIAGNOSIS — Z4801 Encounter for change or removal of surgical wound dressing: Secondary | ICD-10-CM | POA: Diagnosis not present

## 2019-11-28 DIAGNOSIS — M545 Low back pain, unspecified: Secondary | ICD-10-CM | POA: Diagnosis not present

## 2019-11-28 DIAGNOSIS — Z483 Aftercare following surgery for neoplasm: Secondary | ICD-10-CM | POA: Diagnosis not present

## 2019-11-28 DIAGNOSIS — M810 Age-related osteoporosis without current pathological fracture: Secondary | ICD-10-CM | POA: Diagnosis not present

## 2019-11-28 DIAGNOSIS — S22050D Wedge compression fracture of T5-T6 vertebra, subsequent encounter for fracture with routine healing: Secondary | ICD-10-CM | POA: Diagnosis not present

## 2019-11-28 DIAGNOSIS — S32020G Wedge compression fracture of second lumbar vertebra, subsequent encounter for fracture with delayed healing: Secondary | ICD-10-CM | POA: Diagnosis not present

## 2019-11-28 DIAGNOSIS — M8589 Other specified disorders of bone density and structure, multiple sites: Secondary | ICD-10-CM | POA: Diagnosis not present

## 2019-11-28 MED ORDER — DOXYCYCLINE HYCLATE 100 MG PO TABS: 100.0000 mg | ORAL_TABLET | Freq: Every day | ORAL | 3 refills | Status: DC

## 2019-11-28 MED ORDER — METOPROLOL SUCCINATE ER 50 MG PO TB24
25.0000 mg | ORAL_TABLET | Freq: Two times a day (BID) | ORAL | 3 refills | Status: DC
Start: 2019-11-28 — End: 2020-10-11

## 2019-11-28 MED ORDER — DENOSUMAB 60 MG/ML ~~LOC~~ SOSY
60.0000 mg | PREFILLED_SYRINGE | Freq: Once | SUBCUTANEOUS | Status: AC
  Administered 2019-11-28: 60 mg via SUBCUTANEOUS

## 2019-11-28 MED ORDER — GABAPENTIN 100 MG PO CAPS: 100.0000 mg | ORAL_CAPSULE | Freq: Every day | ORAL | 3 refills | Status: DC

## 2019-11-28 NOTE — Assessment & Plan Note (Signed)
Better - Gabapentin at hs, Tylenol

## 2019-11-28 NOTE — Progress Notes (Signed)
Subjective:  Patient ID: [name], female    DOB: Oct 25, 1925  Age: 86 y.o. MRN: [id number]  CC: Follow-up (Rehab F/U- Need Prolia )   HPI [name] presents for LBP, compr fx - post rehab. They saw Dr Saintclair Halsted last week, had X ray  LBP 5/10  Outpatient Medications Prior to Visit  Medication Sig Dispense Refill  . acetaminophen (TYLENOL) 500 MG tablet Take 500 mg by mouth daily as needed for mild pain.     [name] anastrozole (ARIMIDEX) 1 MG tablet Take 1 tablet (1 mg total) by mouth daily. 90 tablet 3  . apixaban (ELIQUIS) 5 MG TABS tablet Take 1 tablet (5 mg total) by mouth 2 (two) times daily. 180 tablet 1  . B Complex-C (B-COMPLEX WITH VITAMIN C) tablet Take 1 tablet by mouth daily.    . busPIRone (BUSPAR) 15 MG tablet Take 1 tablet (15 mg total) by mouth 2 (two) times daily. 60 tablet 11  . Cholecalciferol (VITAMIN D3) 1000 UNITS tablet Take 1,000 Units by mouth daily.      [name] diltiazem (CARDIZEM CD) 120 MG 24 hr capsule TAKE 1 CAPSULE (120 MG TOTAL) BY MOUTH DAILY. (Patient taking differently: Take 120 mg by mouth daily. ) 90 capsule 3  . famotidine (PEPCID) 20 MG tablet Take 20 mg by mouth daily.    [name] gabapentin (NEURONTIN) 100 MG capsule Take 1 capsule (100 mg total) by mouth 4 (four) times daily. Pain (Patient taking differently: Take 100 mg by mouth 3 (three) times daily. ) 120 capsule 3  . HYDROcodone-acetaminophen (NORCO) 10-325 MG tablet Take 1 tablet by mouth every 6 (six) hours as needed. (Patient taking differently: Take 1 tablet by mouth every 6 (six) hours as needed for moderate pain. ) 65 tablet 0  . levothyroxine (SYNTHROID) 25 MCG tablet TAKE 1 TABLET BY MOUTH DAILY FOR THYROID. (Patient taking differently: Take 25 mcg by mouth daily before breakfast. ) 30 tablet 11  . linaclotide (LINZESS) 290 MCG CAPS capsule Take 1 capsule (290 mcg total) by mouth daily. 90 capsule 3  . metoprolol succinate (TOPROL-XL) 50 MG 24 hr tablet Take 1 tablet (50 mg total) by mouth 2 (two)  times daily. Take with or immediately following a meal. 180 tablet 3  . Multiple Vitamins-Minerals (MULTIVITAMIN WITH MINERALS) tablet Take 1 tablet by mouth daily.    . polyethylene glycol (MIRALAX) 17 g packet Take 17 g by mouth daily. 30 each 0  . promethazine (PHENERGAN) 12.5 MG tablet Take 1 tablet (12.5 mg total) by mouth every 6 (six) hours as needed for nausea or vomiting. 65 tablet 1  . temazepam (RESTORIL) 15 MG capsule Take 1 capsule (15 mg total) by mouth at bedtime as needed for sleep. 90 capsule 1  . vitamin C (ASCORBIC ACID) 500 MG tablet Take 500 mg by mouth daily.    [name] atorvastatin (LIPITOR) 10 MG tablet Take 0.5 tablets (5 mg total) by mouth every other day. (Patient not taking: Reported on 11/14/2019) 90 tablet 3   No facility-administered medications prior to visit.    ROS: Review of Systems  Constitutional: Positive for fatigue. Negative for activity change, appetite change, chills and unexpected weight change.  HENT: Negative for congestion, mouth sores and sinus pressure.   Eyes: Negative for visual disturbance.  Respiratory: Negative for cough and chest tightness.   Gastrointestinal: Negative for abdominal pain and nausea.  Genitourinary: Negative for difficulty urinating, frequency and vaginal pain.  Musculoskeletal: Positive for arthralgias, back  pain and gait problem.  Skin: Negative for pallor and rash.  Neurological: Negative for dizziness, tremors, weakness, numbness and headaches.  Psychiatric/Behavioral: Negative for confusion and sleep disturbance.    Objective:  BP 134/80 (BP Location: Left Arm)   Pulse 82   Temp 99.4 F (37.4 C) (Oral)   Wt 165 lb (74.8 kg)   SpO2 95%   BMI 27.04 kg/m   BP Readings from Last 3 Encounters:  11/28/19 134/80  11/14/19 (!) 168/84  10/04/19 127/88    Wt Readings from Last 3 Encounters:  11/28/19 165 lb (74.8 kg)  10/03/19 158 lb 15.2 oz (72.1 kg)  09/28/19 159 lb (72.1 kg)    Physical Exam Constitutional:       General: She is not in acute distress.    Appearance: She is well-developed.  HENT:     Head: Normocephalic.     Right Ear: External ear normal.     Left Ear: External ear normal.     Nose: Nose normal.  Eyes:     General:        Right eye: No discharge.        Left eye: No discharge.     Conjunctiva/sclera: Conjunctivae normal.     Pupils: Pupils are equal, round, and reactive to light.  Neck:     Thyroid: No thyromegaly.     Vascular: No JVD.     Trachea: No tracheal deviation.  Cardiovascular:     Rate and Rhythm: Normal rate and regular rhythm.     Heart sounds: Normal heart sounds.  Pulmonary:     Effort: No respiratory distress.     Breath sounds: No stridor. No wheezing.  Abdominal:     General: Bowel sounds are normal. There is no distension.     Palpations: Abdomen is soft. There is no mass.     Tenderness: There is no abdominal tenderness. There is no guarding or rebound.  Musculoskeletal:        General: Tenderness present.     Cervical back: Normal range of motion and neck supple.  Lymphadenopathy:     Cervical: No cervical adenopathy.  Skin:    Findings: No erythema or rash.  Neurological:     Mental Status: She is oriented to person, place, and time.     Cranial Nerves: No cranial nerve deficit.     Motor: No abnormal muscle tone.     Coordination: Coordination normal.     Gait: Gait abnormal.     Deep Tendon Reflexes: Reflexes normal.  Psychiatric:        Behavior: Behavior normal.        Thought Content: Thought content normal.        Judgment: Judgment normal.    Lower thor pain   Lab Results  Component Value Date   WBC 7.9 10/03/2019   HGB 14.3 10/03/2019   HCT 43.9 10/03/2019   PLT 318 10/03/2019   GLUCOSE 103 (H) 10/03/2019   CHOL 116 06/25/2012   TRIG 82 09/05/2013   HDL 41.50 06/25/2012   LDLDIRECT 110.3 05/24/2008   LDLCALC 66 06/25/2012   ALT 35 09/08/2019   AST 21 09/08/2019   NA 135 10/03/2019   K 3.6 10/03/2019   CL 90  (L) 10/03/2019   CREATININE 1.11 (H) 10/03/2019   BUN 32 (H) 10/03/2019   CO2 30 10/03/2019   TSH 0.66 06/16/2019   INR 1.33 05/23/2017   HGBA1C 5.9 (H) 10/07/2018    CT Hip  Right Wo Contrast  Result Date: 11/14/2019 CLINICAL DATA:  Right hip pain EXAM: CT OF THE RIGHT HIP WITHOUT CONTRAST TECHNIQUE: Multidetector CT imaging of the right hip was performed according to the standard protocol. Multiplanar CT image reconstructions were also generated. COMPARISON:  X-ray 11/14/2019 FINDINGS: Bones/Joint/Cartilage Status post right total hip arthroplasty. Arthroplasty components are in their expected alignment. No periprosthetic fracture is identified. No abnormal periprosthetic lucency to suggest hardware loosening. No periprosthetic fluid collection is seen. The visualized portion of the bony right hemipelvis is intact. There is arthropathy of the right SI joint and pubic symphysis. Ligaments Suboptimally assessed by CT. Muscles and Tendons There is some atrophy within the right gluteus medius and minimus muscle. No acute musculotendinous abnormality by CT. Soft tissues No soft tissue fluid collection or hematoma. Diverticulosis is seen within the visualized portion of the sigmoid colon. IMPRESSION: Status post right total hip arthroplasty without periprosthetic fracture or evidence of hardware complication. Electronically Signed   By: Davina Poke D.O.   On: 11/14/2019 09:41   DG Hip Unilat W or Wo Pelvis 2-3 Views Right  Result Date: 11/14/2019 CLINICAL DATA:  [place] 6 weeks ago.  Right hip pain. EXAM: DG HIP (WITH OR WITHOUT PELVIS) 2-3V RIGHT COMPARISON:  10/03/2019 FINDINGS: Previous right hip replacement continues to have a good appearance. No evidence of regional fracture or hardware complication. Other bones of the pelvis appear normal. IMPRESSION: No acute or traumatic finding. Previous right hip replacement. Electronically Signed   By: Nelson Chimes M.D.   On: 11/14/2019 08:31     Assessment & Plan:   [place] was seen today for follow-up.  Diagnoses and all orders for this visit:  Senile osteoporosis -     denosumab (PROLIA) injection 60 mg     Meds ordered this encounter  Medications  . denosumab (PROLIA) injection 60 mg     Follow-up: No follow-ups on file.  Walker Kehr, MD

## 2019-11-29 DIAGNOSIS — Z483 Aftercare following surgery for neoplasm: Secondary | ICD-10-CM | POA: Diagnosis not present

## 2019-11-29 DIAGNOSIS — S22050D Wedge compression fracture of T5-T6 vertebra, subsequent encounter for fracture with routine healing: Secondary | ICD-10-CM | POA: Diagnosis not present

## 2019-11-29 DIAGNOSIS — S22080D Wedge compression fracture of T11-T12 vertebra, subsequent encounter for fracture with routine healing: Secondary | ICD-10-CM | POA: Diagnosis not present

## 2019-11-29 DIAGNOSIS — S32020G Wedge compression fracture of second lumbar vertebra, subsequent encounter for fracture with delayed healing: Secondary | ICD-10-CM | POA: Diagnosis not present

## 2019-11-29 DIAGNOSIS — Z4801 Encounter for change or removal of surgical wound dressing: Secondary | ICD-10-CM | POA: Diagnosis not present

## 2019-11-29 DIAGNOSIS — M8589 Other specified disorders of bone density and structure, multiple sites: Secondary | ICD-10-CM | POA: Diagnosis not present

## 2019-11-30 DIAGNOSIS — Z4801 Encounter for change or removal of surgical wound dressing: Secondary | ICD-10-CM | POA: Diagnosis not present

## 2019-11-30 DIAGNOSIS — S22050D Wedge compression fracture of T5-T6 vertebra, subsequent encounter for fracture with routine healing: Secondary | ICD-10-CM | POA: Diagnosis not present

## 2019-11-30 DIAGNOSIS — Z483 Aftercare following surgery for neoplasm: Secondary | ICD-10-CM | POA: Diagnosis not present

## 2019-11-30 DIAGNOSIS — S32020G Wedge compression fracture of second lumbar vertebra, subsequent encounter for fracture with delayed healing: Secondary | ICD-10-CM | POA: Diagnosis not present

## 2019-11-30 DIAGNOSIS — S22080D Wedge compression fracture of T11-T12 vertebra, subsequent encounter for fracture with routine healing: Secondary | ICD-10-CM | POA: Diagnosis not present

## 2019-11-30 DIAGNOSIS — M8589 Other specified disorders of bone density and structure, multiple sites: Secondary | ICD-10-CM | POA: Diagnosis not present

## 2019-12-01 DIAGNOSIS — Z483 Aftercare following surgery for neoplasm: Secondary | ICD-10-CM | POA: Diagnosis not present

## 2019-12-01 DIAGNOSIS — M8589 Other specified disorders of bone density and structure, multiple sites: Secondary | ICD-10-CM | POA: Diagnosis not present

## 2019-12-01 DIAGNOSIS — S22080D Wedge compression fracture of T11-T12 vertebra, subsequent encounter for fracture with routine healing: Secondary | ICD-10-CM | POA: Diagnosis not present

## 2019-12-01 DIAGNOSIS — S22050D Wedge compression fracture of T5-T6 vertebra, subsequent encounter for fracture with routine healing: Secondary | ICD-10-CM | POA: Diagnosis not present

## 2019-12-01 DIAGNOSIS — Z4801 Encounter for change or removal of surgical wound dressing: Secondary | ICD-10-CM | POA: Diagnosis not present

## 2019-12-01 DIAGNOSIS — S32020G Wedge compression fracture of second lumbar vertebra, subsequent encounter for fracture with delayed healing: Secondary | ICD-10-CM | POA: Diagnosis not present

## 2019-12-03 DIAGNOSIS — M8589 Other specified disorders of bone density and structure, multiple sites: Secondary | ICD-10-CM | POA: Diagnosis not present

## 2019-12-03 DIAGNOSIS — I471 Supraventricular tachycardia: Secondary | ICD-10-CM | POA: Diagnosis not present

## 2019-12-03 DIAGNOSIS — E039 Hypothyroidism, unspecified: Secondary | ICD-10-CM | POA: Diagnosis not present

## 2019-12-03 DIAGNOSIS — I4819 Other persistent atrial fibrillation: Secondary | ICD-10-CM | POA: Diagnosis not present

## 2019-12-03 DIAGNOSIS — S22080D Wedge compression fracture of T11-T12 vertebra, subsequent encounter for fracture with routine healing: Secondary | ICD-10-CM | POA: Diagnosis not present

## 2019-12-03 DIAGNOSIS — S32020G Wedge compression fracture of second lumbar vertebra, subsequent encounter for fracture with delayed healing: Secondary | ICD-10-CM | POA: Diagnosis not present

## 2019-12-03 DIAGNOSIS — Z483 Aftercare following surgery for neoplasm: Secondary | ICD-10-CM | POA: Diagnosis not present

## 2019-12-03 DIAGNOSIS — Z96641 Presence of right artificial hip joint: Secondary | ICD-10-CM | POA: Diagnosis not present

## 2019-12-03 DIAGNOSIS — I872 Venous insufficiency (chronic) (peripheral): Secondary | ICD-10-CM | POA: Diagnosis not present

## 2019-12-03 DIAGNOSIS — Z4801 Encounter for change or removal of surgical wound dressing: Secondary | ICD-10-CM | POA: Diagnosis not present

## 2019-12-03 DIAGNOSIS — K589 Irritable bowel syndrome without diarrhea: Secondary | ICD-10-CM | POA: Diagnosis not present

## 2019-12-03 DIAGNOSIS — F419 Anxiety disorder, unspecified: Secondary | ICD-10-CM | POA: Diagnosis not present

## 2019-12-03 DIAGNOSIS — Z7901 Long term (current) use of anticoagulants: Secondary | ICD-10-CM | POA: Diagnosis not present

## 2019-12-03 DIAGNOSIS — Z79811 Long term (current) use of aromatase inhibitors: Secondary | ICD-10-CM | POA: Diagnosis not present

## 2019-12-03 DIAGNOSIS — I5032 Chronic diastolic (congestive) heart failure: Secondary | ICD-10-CM | POA: Diagnosis not present

## 2019-12-03 DIAGNOSIS — M86662 Other chronic osteomyelitis, left tibia and fibula: Secondary | ICD-10-CM | POA: Diagnosis not present

## 2019-12-03 DIAGNOSIS — I110 Hypertensive heart disease with heart failure: Secondary | ICD-10-CM | POA: Diagnosis not present

## 2019-12-03 DIAGNOSIS — C50512 Malignant neoplasm of lower-outer quadrant of left female breast: Secondary | ICD-10-CM | POA: Diagnosis not present

## 2019-12-03 DIAGNOSIS — M4726 Other spondylosis with radiculopathy, lumbar region: Secondary | ICD-10-CM | POA: Diagnosis not present

## 2019-12-03 DIAGNOSIS — S22050D Wedge compression fracture of T5-T6 vertebra, subsequent encounter for fracture with routine healing: Secondary | ICD-10-CM | POA: Diagnosis not present

## 2019-12-03 DIAGNOSIS — M5440 Lumbago with sciatica, unspecified side: Secondary | ICD-10-CM | POA: Diagnosis not present

## 2019-12-03 DIAGNOSIS — Z981 Arthrodesis status: Secondary | ICD-10-CM | POA: Diagnosis not present

## 2019-12-03 DIAGNOSIS — M4802 Spinal stenosis, cervical region: Secondary | ICD-10-CM | POA: Diagnosis not present

## 2019-12-03 DIAGNOSIS — E785 Hyperlipidemia, unspecified: Secondary | ICD-10-CM | POA: Diagnosis not present

## 2019-12-03 DIAGNOSIS — Z170 Estrogen receptor positive status [ER+]: Secondary | ICD-10-CM | POA: Diagnosis not present

## 2019-12-05 DIAGNOSIS — Z4801 Encounter for change or removal of surgical wound dressing: Secondary | ICD-10-CM | POA: Diagnosis not present

## 2019-12-05 DIAGNOSIS — S22050D Wedge compression fracture of T5-T6 vertebra, subsequent encounter for fracture with routine healing: Secondary | ICD-10-CM | POA: Diagnosis not present

## 2019-12-05 DIAGNOSIS — M8589 Other specified disorders of bone density and structure, multiple sites: Secondary | ICD-10-CM | POA: Diagnosis not present

## 2019-12-05 DIAGNOSIS — Z483 Aftercare following surgery for neoplasm: Secondary | ICD-10-CM | POA: Diagnosis not present

## 2019-12-05 DIAGNOSIS — S22080D Wedge compression fracture of T11-T12 vertebra, subsequent encounter for fracture with routine healing: Secondary | ICD-10-CM | POA: Diagnosis not present

## 2019-12-05 DIAGNOSIS — S32020G Wedge compression fracture of second lumbar vertebra, subsequent encounter for fracture with delayed healing: Secondary | ICD-10-CM | POA: Diagnosis not present

## 2019-12-06 DIAGNOSIS — S22080D Wedge compression fracture of T11-T12 vertebra, subsequent encounter for fracture with routine healing: Secondary | ICD-10-CM | POA: Diagnosis not present

## 2019-12-06 DIAGNOSIS — Z4801 Encounter for change or removal of surgical wound dressing: Secondary | ICD-10-CM | POA: Diagnosis not present

## 2019-12-06 DIAGNOSIS — S32020G Wedge compression fracture of second lumbar vertebra, subsequent encounter for fracture with delayed healing: Secondary | ICD-10-CM | POA: Diagnosis not present

## 2019-12-06 DIAGNOSIS — S22050D Wedge compression fracture of T5-T6 vertebra, subsequent encounter for fracture with routine healing: Secondary | ICD-10-CM | POA: Diagnosis not present

## 2019-12-06 DIAGNOSIS — M8589 Other specified disorders of bone density and structure, multiple sites: Secondary | ICD-10-CM | POA: Diagnosis not present

## 2019-12-06 DIAGNOSIS — Z483 Aftercare following surgery for neoplasm: Secondary | ICD-10-CM | POA: Diagnosis not present

## 2019-12-08 DIAGNOSIS — S22050D Wedge compression fracture of T5-T6 vertebra, subsequent encounter for fracture with routine healing: Secondary | ICD-10-CM | POA: Diagnosis not present

## 2019-12-08 DIAGNOSIS — Z483 Aftercare following surgery for neoplasm: Secondary | ICD-10-CM | POA: Diagnosis not present

## 2019-12-08 DIAGNOSIS — S22080D Wedge compression fracture of T11-T12 vertebra, subsequent encounter for fracture with routine healing: Secondary | ICD-10-CM | POA: Diagnosis not present

## 2019-12-08 DIAGNOSIS — M8589 Other specified disorders of bone density and structure, multiple sites: Secondary | ICD-10-CM | POA: Diagnosis not present

## 2019-12-08 DIAGNOSIS — Z4801 Encounter for change or removal of surgical wound dressing: Secondary | ICD-10-CM | POA: Diagnosis not present

## 2019-12-08 DIAGNOSIS — S32020G Wedge compression fracture of second lumbar vertebra, subsequent encounter for fracture with delayed healing: Secondary | ICD-10-CM | POA: Diagnosis not present

## 2019-12-09 DIAGNOSIS — Z483 Aftercare following surgery for neoplasm: Secondary | ICD-10-CM | POA: Diagnosis not present

## 2019-12-09 DIAGNOSIS — S32020G Wedge compression fracture of second lumbar vertebra, subsequent encounter for fracture with delayed healing: Secondary | ICD-10-CM | POA: Diagnosis not present

## 2019-12-09 DIAGNOSIS — Z4801 Encounter for change or removal of surgical wound dressing: Secondary | ICD-10-CM | POA: Diagnosis not present

## 2019-12-09 DIAGNOSIS — M8589 Other specified disorders of bone density and structure, multiple sites: Secondary | ICD-10-CM | POA: Diagnosis not present

## 2019-12-09 DIAGNOSIS — S22080D Wedge compression fracture of T11-T12 vertebra, subsequent encounter for fracture with routine healing: Secondary | ICD-10-CM | POA: Diagnosis not present

## 2019-12-09 DIAGNOSIS — S22050D Wedge compression fracture of T5-T6 vertebra, subsequent encounter for fracture with routine healing: Secondary | ICD-10-CM | POA: Diagnosis not present

## 2019-12-12 DIAGNOSIS — S32020G Wedge compression fracture of second lumbar vertebra, subsequent encounter for fracture with delayed healing: Secondary | ICD-10-CM | POA: Diagnosis not present

## 2019-12-12 DIAGNOSIS — M8589 Other specified disorders of bone density and structure, multiple sites: Secondary | ICD-10-CM | POA: Diagnosis not present

## 2019-12-12 DIAGNOSIS — S22050D Wedge compression fracture of T5-T6 vertebra, subsequent encounter for fracture with routine healing: Secondary | ICD-10-CM | POA: Diagnosis not present

## 2019-12-12 DIAGNOSIS — Z4801 Encounter for change or removal of surgical wound dressing: Secondary | ICD-10-CM | POA: Diagnosis not present

## 2019-12-12 DIAGNOSIS — Z483 Aftercare following surgery for neoplasm: Secondary | ICD-10-CM | POA: Diagnosis not present

## 2019-12-12 DIAGNOSIS — S22080D Wedge compression fracture of T11-T12 vertebra, subsequent encounter for fracture with routine healing: Secondary | ICD-10-CM | POA: Diagnosis not present

## 2019-12-13 ENCOUNTER — Telehealth: Payer: Self-pay | Admitting: Internal Medicine

## 2019-12-13 NOTE — Telephone Encounter (Signed)
temazepam (RESTORIL) 15 MG capsule [place], [place] Phone:  [phone]  Fax:  [phone]     Pharmacy calling to get a refill faxed over. Fax # [phone]

## 2019-12-16 DIAGNOSIS — Z483 Aftercare following surgery for neoplasm: Secondary | ICD-10-CM | POA: Diagnosis not present

## 2019-12-16 DIAGNOSIS — S22080D Wedge compression fracture of T11-T12 vertebra, subsequent encounter for fracture with routine healing: Secondary | ICD-10-CM | POA: Diagnosis not present

## 2019-12-16 DIAGNOSIS — S32020G Wedge compression fracture of second lumbar vertebra, subsequent encounter for fracture with delayed healing: Secondary | ICD-10-CM | POA: Diagnosis not present

## 2019-12-16 DIAGNOSIS — Z4801 Encounter for change or removal of surgical wound dressing: Secondary | ICD-10-CM | POA: Diagnosis not present

## 2019-12-16 DIAGNOSIS — S22050D Wedge compression fracture of T5-T6 vertebra, subsequent encounter for fracture with routine healing: Secondary | ICD-10-CM | POA: Diagnosis not present

## 2019-12-16 DIAGNOSIS — M8589 Other specified disorders of bone density and structure, multiple sites: Secondary | ICD-10-CM | POA: Diagnosis not present

## 2019-12-19 ENCOUNTER — Ambulatory Visit: Payer: Medicare Other | Admitting: Internal Medicine

## 2019-12-19 DIAGNOSIS — S22080D Wedge compression fracture of T11-T12 vertebra, subsequent encounter for fracture with routine healing: Secondary | ICD-10-CM | POA: Diagnosis not present

## 2019-12-19 DIAGNOSIS — M8589 Other specified disorders of bone density and structure, multiple sites: Secondary | ICD-10-CM | POA: Diagnosis not present

## 2019-12-19 DIAGNOSIS — Z4801 Encounter for change or removal of surgical wound dressing: Secondary | ICD-10-CM | POA: Diagnosis not present

## 2019-12-19 DIAGNOSIS — S22050D Wedge compression fracture of T5-T6 vertebra, subsequent encounter for fracture with routine healing: Secondary | ICD-10-CM | POA: Diagnosis not present

## 2019-12-19 DIAGNOSIS — Z483 Aftercare following surgery for neoplasm: Secondary | ICD-10-CM | POA: Diagnosis not present

## 2019-12-19 DIAGNOSIS — S32020G Wedge compression fracture of second lumbar vertebra, subsequent encounter for fracture with delayed healing: Secondary | ICD-10-CM | POA: Diagnosis not present

## 2019-12-20 NOTE — Telephone Encounter (Signed)
Patients granddaughter calling to follow up on this, pharmacy stating they haven't received this.

## 2019-12-21 MED ORDER — TEMAZEPAM 15 MG PO CAPS: 15.0000 mg | ORAL_CAPSULE | Freq: Every evening | ORAL | 1 refills | Status: DC | PRN

## 2019-12-21 NOTE — Telephone Encounter (Signed)
Done. Thanks.

## 2019-12-21 NOTE — Addendum Note (Signed)
Addended by: [name] on: 12/21/2019 06:56 AM   Modules accepted: Orders

## 2019-12-22 ENCOUNTER — Telehealth: Payer: Self-pay | Admitting: Internal Medicine

## 2019-12-22 DIAGNOSIS — M8589 Other specified disorders of bone density and structure, multiple sites: Secondary | ICD-10-CM | POA: Diagnosis not present

## 2019-12-22 DIAGNOSIS — Z483 Aftercare following surgery for neoplasm: Secondary | ICD-10-CM | POA: Diagnosis not present

## 2019-12-22 DIAGNOSIS — S32020G Wedge compression fracture of second lumbar vertebra, subsequent encounter for fracture with delayed healing: Secondary | ICD-10-CM | POA: Diagnosis not present

## 2019-12-22 DIAGNOSIS — Z4801 Encounter for change or removal of surgical wound dressing: Secondary | ICD-10-CM | POA: Diagnosis not present

## 2019-12-22 DIAGNOSIS — S22080D Wedge compression fracture of T11-T12 vertebra, subsequent encounter for fracture with routine healing: Secondary | ICD-10-CM | POA: Diagnosis not present

## 2019-12-22 DIAGNOSIS — S22050D Wedge compression fracture of T5-T6 vertebra, subsequent encounter for fracture with routine healing: Secondary | ICD-10-CM | POA: Diagnosis not present

## 2019-12-22 NOTE — Telephone Encounter (Signed)
   [name] from [facility] calling back to correct order. Instead of using Aquacel, they plan to use petroleum gauze

## 2019-12-22 NOTE — Telephone Encounter (Signed)
[name] from [facility] calling stating the patient has a skin tear right lower leg started a new treatment of aquacel ag 2 times a week until its healed. [place]

## 2019-12-23 NOTE — Telephone Encounter (Signed)
Okay.  Thanks.

## 2019-12-26 DIAGNOSIS — Z483 Aftercare following surgery for neoplasm: Secondary | ICD-10-CM | POA: Diagnosis not present

## 2019-12-26 DIAGNOSIS — Z4801 Encounter for change or removal of surgical wound dressing: Secondary | ICD-10-CM | POA: Diagnosis not present

## 2019-12-26 DIAGNOSIS — M8589 Other specified disorders of bone density and structure, multiple sites: Secondary | ICD-10-CM | POA: Diagnosis not present

## 2019-12-26 DIAGNOSIS — S32020G Wedge compression fracture of second lumbar vertebra, subsequent encounter for fracture with delayed healing: Secondary | ICD-10-CM | POA: Diagnosis not present

## 2019-12-26 DIAGNOSIS — S22080D Wedge compression fracture of T11-T12 vertebra, subsequent encounter for fracture with routine healing: Secondary | ICD-10-CM | POA: Diagnosis not present

## 2019-12-26 DIAGNOSIS — S22050D Wedge compression fracture of T5-T6 vertebra, subsequent encounter for fracture with routine healing: Secondary | ICD-10-CM | POA: Diagnosis not present

## 2019-12-28 DIAGNOSIS — M8589 Other specified disorders of bone density and structure, multiple sites: Secondary | ICD-10-CM | POA: Diagnosis not present

## 2019-12-28 DIAGNOSIS — S22050D Wedge compression fracture of T5-T6 vertebra, subsequent encounter for fracture with routine healing: Secondary | ICD-10-CM | POA: Diagnosis not present

## 2019-12-28 DIAGNOSIS — S32020G Wedge compression fracture of second lumbar vertebra, subsequent encounter for fracture with delayed healing: Secondary | ICD-10-CM | POA: Diagnosis not present

## 2019-12-28 DIAGNOSIS — S22080D Wedge compression fracture of T11-T12 vertebra, subsequent encounter for fracture with routine healing: Secondary | ICD-10-CM | POA: Diagnosis not present

## 2019-12-28 DIAGNOSIS — Z4801 Encounter for change or removal of surgical wound dressing: Secondary | ICD-10-CM | POA: Diagnosis not present

## 2019-12-28 DIAGNOSIS — Z483 Aftercare following surgery for neoplasm: Secondary | ICD-10-CM | POA: Diagnosis not present

## 2020-01-02 DIAGNOSIS — E785 Hyperlipidemia, unspecified: Secondary | ICD-10-CM | POA: Diagnosis not present

## 2020-01-02 DIAGNOSIS — I872 Venous insufficiency (chronic) (peripheral): Secondary | ICD-10-CM | POA: Diagnosis not present

## 2020-01-02 DIAGNOSIS — E039 Hypothyroidism, unspecified: Secondary | ICD-10-CM | POA: Diagnosis not present

## 2020-01-02 DIAGNOSIS — I471 Supraventricular tachycardia: Secondary | ICD-10-CM | POA: Diagnosis not present

## 2020-01-02 DIAGNOSIS — Z483 Aftercare following surgery for neoplasm: Secondary | ICD-10-CM | POA: Diagnosis not present

## 2020-01-02 DIAGNOSIS — I5032 Chronic diastolic (congestive) heart failure: Secondary | ICD-10-CM | POA: Diagnosis not present

## 2020-01-02 DIAGNOSIS — S22050D Wedge compression fracture of T5-T6 vertebra, subsequent encounter for fracture with routine healing: Secondary | ICD-10-CM | POA: Diagnosis not present

## 2020-01-02 DIAGNOSIS — Z79811 Long term (current) use of aromatase inhibitors: Secondary | ICD-10-CM | POA: Diagnosis not present

## 2020-01-02 DIAGNOSIS — Z4801 Encounter for change or removal of surgical wound dressing: Secondary | ICD-10-CM | POA: Diagnosis not present

## 2020-01-02 DIAGNOSIS — M8589 Other specified disorders of bone density and structure, multiple sites: Secondary | ICD-10-CM | POA: Diagnosis not present

## 2020-01-02 DIAGNOSIS — I110 Hypertensive heart disease with heart failure: Secondary | ICD-10-CM | POA: Diagnosis not present

## 2020-01-02 DIAGNOSIS — Z981 Arthrodesis status: Secondary | ICD-10-CM | POA: Diagnosis not present

## 2020-01-02 DIAGNOSIS — K589 Irritable bowel syndrome without diarrhea: Secondary | ICD-10-CM | POA: Diagnosis not present

## 2020-01-02 DIAGNOSIS — I4819 Other persistent atrial fibrillation: Secondary | ICD-10-CM | POA: Diagnosis not present

## 2020-01-02 DIAGNOSIS — S22080D Wedge compression fracture of T11-T12 vertebra, subsequent encounter for fracture with routine healing: Secondary | ICD-10-CM | POA: Diagnosis not present

## 2020-01-02 DIAGNOSIS — M4726 Other spondylosis with radiculopathy, lumbar region: Secondary | ICD-10-CM | POA: Diagnosis not present

## 2020-01-02 DIAGNOSIS — M86662 Other chronic osteomyelitis, left tibia and fibula: Secondary | ICD-10-CM | POA: Diagnosis not present

## 2020-01-02 DIAGNOSIS — M5440 Lumbago with sciatica, unspecified side: Secondary | ICD-10-CM | POA: Diagnosis not present

## 2020-01-02 DIAGNOSIS — C50512 Malignant neoplasm of lower-outer quadrant of left female breast: Secondary | ICD-10-CM | POA: Diagnosis not present

## 2020-01-02 DIAGNOSIS — Z170 Estrogen receptor positive status [ER+]: Secondary | ICD-10-CM | POA: Diagnosis not present

## 2020-01-02 DIAGNOSIS — M4802 Spinal stenosis, cervical region: Secondary | ICD-10-CM | POA: Diagnosis not present

## 2020-01-02 DIAGNOSIS — F419 Anxiety disorder, unspecified: Secondary | ICD-10-CM | POA: Diagnosis not present

## 2020-01-02 DIAGNOSIS — S81801D Unspecified open wound, right lower leg, subsequent encounter: Secondary | ICD-10-CM | POA: Diagnosis not present

## 2020-01-02 DIAGNOSIS — S32020G Wedge compression fracture of second lumbar vertebra, subsequent encounter for fracture with delayed healing: Secondary | ICD-10-CM | POA: Diagnosis not present

## 2020-01-02 DIAGNOSIS — Z96641 Presence of right artificial hip joint: Secondary | ICD-10-CM | POA: Diagnosis not present

## 2020-01-03 ENCOUNTER — Other Ambulatory Visit: Payer: Self-pay | Admitting: *Deleted

## 2020-01-03 DIAGNOSIS — Z483 Aftercare following surgery for neoplasm: Secondary | ICD-10-CM | POA: Diagnosis not present

## 2020-01-03 DIAGNOSIS — S81801D Unspecified open wound, right lower leg, subsequent encounter: Secondary | ICD-10-CM | POA: Diagnosis not present

## 2020-01-03 DIAGNOSIS — S22080D Wedge compression fracture of T11-T12 vertebra, subsequent encounter for fracture with routine healing: Secondary | ICD-10-CM | POA: Diagnosis not present

## 2020-01-03 DIAGNOSIS — S22050D Wedge compression fracture of T5-T6 vertebra, subsequent encounter for fracture with routine healing: Secondary | ICD-10-CM | POA: Diagnosis not present

## 2020-01-03 DIAGNOSIS — Z4801 Encounter for change or removal of surgical wound dressing: Secondary | ICD-10-CM | POA: Diagnosis not present

## 2020-01-03 DIAGNOSIS — S32020G Wedge compression fracture of second lumbar vertebra, subsequent encounter for fracture with delayed healing: Secondary | ICD-10-CM | POA: Diagnosis not present

## 2020-01-03 MED ORDER — LINACLOTIDE 290 MCG PO CAPS: 290.0000 ug | ORAL_CAPSULE | Freq: Every day | ORAL | 11 refills | Status: DC

## 2020-01-04 DIAGNOSIS — Z85828 Personal history of other malignant neoplasm of skin: Secondary | ICD-10-CM | POA: Diagnosis not present

## 2020-01-04 DIAGNOSIS — I8312 Varicose veins of left lower extremity with inflammation: Secondary | ICD-10-CM | POA: Diagnosis not present

## 2020-01-04 DIAGNOSIS — I8311 Varicose veins of right lower extremity with inflammation: Secondary | ICD-10-CM | POA: Diagnosis not present

## 2020-01-04 DIAGNOSIS — L308 Other specified dermatitis: Secondary | ICD-10-CM | POA: Diagnosis not present

## 2020-01-04 DIAGNOSIS — L0889 Other specified local infections of the skin and subcutaneous tissue: Secondary | ICD-10-CM | POA: Diagnosis not present

## 2020-01-04 DIAGNOSIS — I872 Venous insufficiency (chronic) (peripheral): Secondary | ICD-10-CM | POA: Diagnosis not present

## 2020-01-05 DIAGNOSIS — I10 Essential (primary) hypertension: Secondary | ICD-10-CM | POA: Diagnosis not present

## 2020-01-05 DIAGNOSIS — S32020G Wedge compression fracture of second lumbar vertebra, subsequent encounter for fracture with delayed healing: Secondary | ICD-10-CM | POA: Diagnosis not present

## 2020-01-05 DIAGNOSIS — S22050D Wedge compression fracture of T5-T6 vertebra, subsequent encounter for fracture with routine healing: Secondary | ICD-10-CM | POA: Diagnosis not present

## 2020-01-05 DIAGNOSIS — S22080D Wedge compression fracture of T11-T12 vertebra, subsequent encounter for fracture with routine healing: Secondary | ICD-10-CM | POA: Diagnosis not present

## 2020-01-05 DIAGNOSIS — S22050A Wedge compression fracture of T5-T6 vertebra, initial encounter for closed fracture: Secondary | ICD-10-CM | POA: Diagnosis not present

## 2020-01-05 DIAGNOSIS — Z483 Aftercare following surgery for neoplasm: Secondary | ICD-10-CM | POA: Diagnosis not present

## 2020-01-05 DIAGNOSIS — Z6828 Body mass index (BMI) 28.0-28.9, adult: Secondary | ICD-10-CM | POA: Diagnosis not present

## 2020-01-05 DIAGNOSIS — S81801D Unspecified open wound, right lower leg, subsequent encounter: Secondary | ICD-10-CM | POA: Diagnosis not present

## 2020-01-05 DIAGNOSIS — Z4801 Encounter for change or removal of surgical wound dressing: Secondary | ICD-10-CM | POA: Diagnosis not present

## 2020-01-09 DIAGNOSIS — Z4801 Encounter for change or removal of surgical wound dressing: Secondary | ICD-10-CM | POA: Diagnosis not present

## 2020-01-09 DIAGNOSIS — S81801D Unspecified open wound, right lower leg, subsequent encounter: Secondary | ICD-10-CM | POA: Diagnosis not present

## 2020-01-09 DIAGNOSIS — S22050D Wedge compression fracture of T5-T6 vertebra, subsequent encounter for fracture with routine healing: Secondary | ICD-10-CM | POA: Diagnosis not present

## 2020-01-09 DIAGNOSIS — S22080D Wedge compression fracture of T11-T12 vertebra, subsequent encounter for fracture with routine healing: Secondary | ICD-10-CM | POA: Diagnosis not present

## 2020-01-09 DIAGNOSIS — Z483 Aftercare following surgery for neoplasm: Secondary | ICD-10-CM | POA: Diagnosis not present

## 2020-01-09 DIAGNOSIS — S32020G Wedge compression fracture of second lumbar vertebra, subsequent encounter for fracture with delayed healing: Secondary | ICD-10-CM | POA: Diagnosis not present

## 2020-01-12 DIAGNOSIS — S32020G Wedge compression fracture of second lumbar vertebra, subsequent encounter for fracture with delayed healing: Secondary | ICD-10-CM | POA: Diagnosis not present

## 2020-01-12 DIAGNOSIS — S22050D Wedge compression fracture of T5-T6 vertebra, subsequent encounter for fracture with routine healing: Secondary | ICD-10-CM | POA: Diagnosis not present

## 2020-01-12 DIAGNOSIS — Z4801 Encounter for change or removal of surgical wound dressing: Secondary | ICD-10-CM | POA: Diagnosis not present

## 2020-01-12 DIAGNOSIS — Z483 Aftercare following surgery for neoplasm: Secondary | ICD-10-CM | POA: Diagnosis not present

## 2020-01-12 DIAGNOSIS — S81801D Unspecified open wound, right lower leg, subsequent encounter: Secondary | ICD-10-CM | POA: Diagnosis not present

## 2020-01-12 DIAGNOSIS — S22080D Wedge compression fracture of T11-T12 vertebra, subsequent encounter for fracture with routine healing: Secondary | ICD-10-CM | POA: Diagnosis not present

## 2020-01-16 DIAGNOSIS — Z483 Aftercare following surgery for neoplasm: Secondary | ICD-10-CM | POA: Diagnosis not present

## 2020-01-16 DIAGNOSIS — Z4801 Encounter for change or removal of surgical wound dressing: Secondary | ICD-10-CM | POA: Diagnosis not present

## 2020-01-16 DIAGNOSIS — S32020G Wedge compression fracture of second lumbar vertebra, subsequent encounter for fracture with delayed healing: Secondary | ICD-10-CM | POA: Diagnosis not present

## 2020-01-16 DIAGNOSIS — S22050D Wedge compression fracture of T5-T6 vertebra, subsequent encounter for fracture with routine healing: Secondary | ICD-10-CM | POA: Diagnosis not present

## 2020-01-16 DIAGNOSIS — S22080D Wedge compression fracture of T11-T12 vertebra, subsequent encounter for fracture with routine healing: Secondary | ICD-10-CM | POA: Diagnosis not present

## 2020-01-16 DIAGNOSIS — S81801D Unspecified open wound, right lower leg, subsequent encounter: Secondary | ICD-10-CM | POA: Diagnosis not present

## 2020-01-17 ENCOUNTER — Other Ambulatory Visit: Payer: Self-pay

## 2020-01-17 MED ORDER — APIXABAN 5 MG PO TABS
5.0000 mg | ORAL_TABLET | Freq: Two times a day (BID) | ORAL | 0 refills | Status: DC
Start: 2020-01-17 — End: 2020-04-16

## 2020-01-17 NOTE — Telephone Encounter (Signed)
Pt last saw Ronie Spies, PA on 08/11/18, pt is overdue for follow-up.  Pt has upcoming appt scheduled for 02/06/20 with Dr Clifton James. Last labs 10/03/19 Creat 1.11, age 86, weight 74.8kg, based on specified criteria pt is on appropriate dosage of Eliquis 5mg  BID.  Will refill rx to get pt to upcoming appt with Dr .

## 2020-01-19 DIAGNOSIS — S22080D Wedge compression fracture of T11-T12 vertebra, subsequent encounter for fracture with routine healing: Secondary | ICD-10-CM | POA: Diagnosis not present

## 2020-01-19 DIAGNOSIS — S32020G Wedge compression fracture of second lumbar vertebra, subsequent encounter for fracture with delayed healing: Secondary | ICD-10-CM | POA: Diagnosis not present

## 2020-01-19 DIAGNOSIS — Z483 Aftercare following surgery for neoplasm: Secondary | ICD-10-CM | POA: Diagnosis not present

## 2020-01-19 DIAGNOSIS — S81801D Unspecified open wound, right lower leg, subsequent encounter: Secondary | ICD-10-CM | POA: Diagnosis not present

## 2020-01-19 DIAGNOSIS — Z4801 Encounter for change or removal of surgical wound dressing: Secondary | ICD-10-CM | POA: Diagnosis not present

## 2020-01-19 DIAGNOSIS — S22050D Wedge compression fracture of T5-T6 vertebra, subsequent encounter for fracture with routine healing: Secondary | ICD-10-CM | POA: Diagnosis not present

## 2020-01-23 ENCOUNTER — Other Ambulatory Visit: Payer: Self-pay | Admitting: *Deleted

## 2020-01-23 DIAGNOSIS — Z483 Aftercare following surgery for neoplasm: Secondary | ICD-10-CM | POA: Diagnosis not present

## 2020-01-23 DIAGNOSIS — S22080D Wedge compression fracture of T11-T12 vertebra, subsequent encounter for fracture with routine healing: Secondary | ICD-10-CM | POA: Diagnosis not present

## 2020-01-23 DIAGNOSIS — Z4801 Encounter for change or removal of surgical wound dressing: Secondary | ICD-10-CM | POA: Diagnosis not present

## 2020-01-23 DIAGNOSIS — S22050D Wedge compression fracture of T5-T6 vertebra, subsequent encounter for fracture with routine healing: Secondary | ICD-10-CM | POA: Diagnosis not present

## 2020-01-23 DIAGNOSIS — S32020G Wedge compression fracture of second lumbar vertebra, subsequent encounter for fracture with delayed healing: Secondary | ICD-10-CM | POA: Diagnosis not present

## 2020-01-23 DIAGNOSIS — S81801D Unspecified open wound, right lower leg, subsequent encounter: Secondary | ICD-10-CM | POA: Diagnosis not present

## 2020-01-23 MED ORDER — BUSPIRONE HCL 15 MG PO TABS
15.0000 mg | ORAL_TABLET | Freq: Two times a day (BID) | ORAL | 11 refills | Status: DC
Start: 2020-01-23 — End: 2020-11-07

## 2020-01-23 NOTE — Telephone Encounter (Signed)
Md received paper refill for Buspirone 15 mg. MD completed and was faxed back to [facility].Marland KitchenRaechel Chute

## 2020-01-27 DIAGNOSIS — Z4801 Encounter for change or removal of surgical wound dressing: Secondary | ICD-10-CM | POA: Diagnosis not present

## 2020-01-27 DIAGNOSIS — Z483 Aftercare following surgery for neoplasm: Secondary | ICD-10-CM | POA: Diagnosis not present

## 2020-01-27 DIAGNOSIS — S32020G Wedge compression fracture of second lumbar vertebra, subsequent encounter for fracture with delayed healing: Secondary | ICD-10-CM | POA: Diagnosis not present

## 2020-01-27 DIAGNOSIS — S22080D Wedge compression fracture of T11-T12 vertebra, subsequent encounter for fracture with routine healing: Secondary | ICD-10-CM | POA: Diagnosis not present

## 2020-01-27 DIAGNOSIS — S22050D Wedge compression fracture of T5-T6 vertebra, subsequent encounter for fracture with routine healing: Secondary | ICD-10-CM | POA: Diagnosis not present

## 2020-01-27 DIAGNOSIS — S81801D Unspecified open wound, right lower leg, subsequent encounter: Secondary | ICD-10-CM | POA: Diagnosis not present

## 2020-01-31 DIAGNOSIS — Z483 Aftercare following surgery for neoplasm: Secondary | ICD-10-CM | POA: Diagnosis not present

## 2020-01-31 DIAGNOSIS — S22080D Wedge compression fracture of T11-T12 vertebra, subsequent encounter for fracture with routine healing: Secondary | ICD-10-CM | POA: Diagnosis not present

## 2020-01-31 DIAGNOSIS — Z4801 Encounter for change or removal of surgical wound dressing: Secondary | ICD-10-CM | POA: Diagnosis not present

## 2020-01-31 DIAGNOSIS — S81801D Unspecified open wound, right lower leg, subsequent encounter: Secondary | ICD-10-CM | POA: Diagnosis not present

## 2020-01-31 DIAGNOSIS — S32020G Wedge compression fracture of second lumbar vertebra, subsequent encounter for fracture with delayed healing: Secondary | ICD-10-CM | POA: Diagnosis not present

## 2020-01-31 DIAGNOSIS — S22050D Wedge compression fracture of T5-T6 vertebra, subsequent encounter for fracture with routine healing: Secondary | ICD-10-CM | POA: Diagnosis not present

## 2020-02-01 DIAGNOSIS — E785 Hyperlipidemia, unspecified: Secondary | ICD-10-CM | POA: Diagnosis not present

## 2020-02-01 DIAGNOSIS — Z483 Aftercare following surgery for neoplasm: Secondary | ICD-10-CM | POA: Diagnosis not present

## 2020-02-01 DIAGNOSIS — M4802 Spinal stenosis, cervical region: Secondary | ICD-10-CM | POA: Diagnosis not present

## 2020-02-01 DIAGNOSIS — I872 Venous insufficiency (chronic) (peripheral): Secondary | ICD-10-CM | POA: Diagnosis not present

## 2020-02-01 DIAGNOSIS — Z96641 Presence of right artificial hip joint: Secondary | ICD-10-CM | POA: Diagnosis not present

## 2020-02-01 DIAGNOSIS — Z170 Estrogen receptor positive status [ER+]: Secondary | ICD-10-CM | POA: Diagnosis not present

## 2020-02-01 DIAGNOSIS — F419 Anxiety disorder, unspecified: Secondary | ICD-10-CM | POA: Diagnosis not present

## 2020-02-01 DIAGNOSIS — Z79811 Long term (current) use of aromatase inhibitors: Secondary | ICD-10-CM | POA: Diagnosis not present

## 2020-02-01 DIAGNOSIS — C50512 Malignant neoplasm of lower-outer quadrant of left female breast: Secondary | ICD-10-CM | POA: Diagnosis not present

## 2020-02-01 DIAGNOSIS — I471 Supraventricular tachycardia: Secondary | ICD-10-CM | POA: Diagnosis not present

## 2020-02-01 DIAGNOSIS — K589 Irritable bowel syndrome without diarrhea: Secondary | ICD-10-CM | POA: Diagnosis not present

## 2020-02-01 DIAGNOSIS — I110 Hypertensive heart disease with heart failure: Secondary | ICD-10-CM | POA: Diagnosis not present

## 2020-02-01 DIAGNOSIS — S81801D Unspecified open wound, right lower leg, subsequent encounter: Secondary | ICD-10-CM | POA: Diagnosis not present

## 2020-02-01 DIAGNOSIS — S32020G Wedge compression fracture of second lumbar vertebra, subsequent encounter for fracture with delayed healing: Secondary | ICD-10-CM | POA: Diagnosis not present

## 2020-02-01 DIAGNOSIS — M8589 Other specified disorders of bone density and structure, multiple sites: Secondary | ICD-10-CM | POA: Diagnosis not present

## 2020-02-01 DIAGNOSIS — M86662 Other chronic osteomyelitis, left tibia and fibula: Secondary | ICD-10-CM | POA: Diagnosis not present

## 2020-02-01 DIAGNOSIS — I5032 Chronic diastolic (congestive) heart failure: Secondary | ICD-10-CM | POA: Diagnosis not present

## 2020-02-01 DIAGNOSIS — Z4801 Encounter for change or removal of surgical wound dressing: Secondary | ICD-10-CM | POA: Diagnosis not present

## 2020-02-01 DIAGNOSIS — M5440 Lumbago with sciatica, unspecified side: Secondary | ICD-10-CM | POA: Diagnosis not present

## 2020-02-01 DIAGNOSIS — M4726 Other spondylosis with radiculopathy, lumbar region: Secondary | ICD-10-CM | POA: Diagnosis not present

## 2020-02-01 DIAGNOSIS — E039 Hypothyroidism, unspecified: Secondary | ICD-10-CM | POA: Diagnosis not present

## 2020-02-01 DIAGNOSIS — I4819 Other persistent atrial fibrillation: Secondary | ICD-10-CM | POA: Diagnosis not present

## 2020-02-01 DIAGNOSIS — Z981 Arthrodesis status: Secondary | ICD-10-CM | POA: Diagnosis not present

## 2020-02-01 DIAGNOSIS — S22080D Wedge compression fracture of T11-T12 vertebra, subsequent encounter for fracture with routine healing: Secondary | ICD-10-CM | POA: Diagnosis not present

## 2020-02-01 DIAGNOSIS — S22050D Wedge compression fracture of T5-T6 vertebra, subsequent encounter for fracture with routine healing: Secondary | ICD-10-CM | POA: Diagnosis not present

## 2020-02-02 DIAGNOSIS — Z483 Aftercare following surgery for neoplasm: Secondary | ICD-10-CM | POA: Diagnosis not present

## 2020-02-02 DIAGNOSIS — S22080D Wedge compression fracture of T11-T12 vertebra, subsequent encounter for fracture with routine healing: Secondary | ICD-10-CM | POA: Diagnosis not present

## 2020-02-02 DIAGNOSIS — S22050D Wedge compression fracture of T5-T6 vertebra, subsequent encounter for fracture with routine healing: Secondary | ICD-10-CM | POA: Diagnosis not present

## 2020-02-02 DIAGNOSIS — Z4801 Encounter for change or removal of surgical wound dressing: Secondary | ICD-10-CM | POA: Diagnosis not present

## 2020-02-02 DIAGNOSIS — S32020G Wedge compression fracture of second lumbar vertebra, subsequent encounter for fracture with delayed healing: Secondary | ICD-10-CM | POA: Diagnosis not present

## 2020-02-02 DIAGNOSIS — S81801D Unspecified open wound, right lower leg, subsequent encounter: Secondary | ICD-10-CM | POA: Diagnosis not present

## 2020-02-05 NOTE — Progress Notes (Deleted)
Virtual Visit via Video Note   This visit type was conducted due to national recommendations for restrictions regarding the COVID-19 Pandemic (e.g. social distancing) in an effort to limit this patient's exposure and mitigate transmission in our community.  Due to her co-morbid illnesses, this patient is at least at moderate risk for complications without adequate follow up.  This format is felt to be most appropriate for this patient at this time.  All issues noted in this document were discussed and addressed.  A limited physical exam was performed with this format.  Please refer to the patient's chart for her consent to telehealth for [facility].  {Did you have to convert this VIDEO visit to AUDIO only?                     :[id number]   Date:  02/05/2020   ID:  [name], DOB 09-25-1925, MRN [id number]  Patient Location: Home Provider Location: Home Office  PCP:  [name], [name], MD  Cardiologist:  Lauree Chandler, MD  Electrophysiologist:  None   Evaluation Performed:  Follow-Up Visit  Chief Complaint:  Follow up- ***  History of Present Illness:    87 yo female with history of HTN, chronic low back pain, iron deficiency anemia and persistent atrial fibrillation/flutter on Eliquis who is here today for follow up. In 2011, while on coumadin, she had dark stools with anemia. Coumadin was held and upper endoscopy showed several small clean based gastric ulcers. She has had no bleeding issues on Eliquis. She has chronic lower ext edema controlled with Lasix. She has left breast cancer and has had lumpectomy in July 2017. Echo April 2018 with normal LV systolic function, [id number]. There was trivial mitral regurgitation.   The patient denies chest pain, dyspnea, palpitations, dizziness, near syncope or syncope. No lower extremity edema.   The patient {does/does not:200015} have symptoms concerning for COVID-19 infection (fever, chills, cough, or new shortness of  breath).    Past Medical History:  Diagnosis Date  . Anxiety    denies  . Atrial flutter ([place])   . Bilateral edema of lower extremity   . Breast cancer ([place])    left breast  . Chronic constipation   . Chronic diastolic CHF (congestive heart failure) ([place])   . Diverticulosis of colon   . GERD (gastroesophageal reflux disease)   . H/O hiatal hernia   . History of cellulitis    LEFT LOWER LEG --  SEPT 2015  . History of colitis    DX  2003  WITH ISCHEMIC COLITIS  . History of diverticulitis of colon    perferated diverticulitis w/ abscess 08-25-2013 drain placed --  resolved without surgical intervention  . History of GI bleed    2011--- UPPER SECONARY GASTRIC ULCER FROM NSAID USE  . History of Helicobacter pylori infection    2011  . History of ulcer of lower limb    2012   RIGHT LOWER EXTREMITIY--  STATSIS ULCER  . Hyperlipidemia   . Hypertension   . Hypothyroidism   . Iron deficiency anemia 2011   elevated MCV  . LBP (low back pain)    severe lumbar spondylosis s/p L3/L4,L4/L5 fusion 12/10  . Lumbar spondylolysis   . OA (osteoarthritis)    "hands" (08/25/2013)  . Open wound of left lower leg   . Osteopenia   . Persistent atrial fibrillation ([place])    CARDIOLOGIST-   DR MCALANY  . RBBB   .  Urinary frequency    Past Surgical History:  Procedure Laterality Date  . APPLICATION OF A-CELL OF EXTREMITY Left 06/21/2013   Procedure: APPLICATION OF A-CELL OF EXTREMITY;  Surgeon: Wayland Denis, DO;  Location: [facility] OR;  Service: Plastics;  Laterality: Left;  . APPLICATION OF A-CELL OF EXTREMITY Left 08/22/2013   Procedure: APPLICATION OF A-CELL/VAC TO LOWER LEFT LEG WOUND;  Surgeon: Wayland Denis, DO;  Location: [facility];  Service: Plastics;  Laterality: Left;  . APPLICATION OF A-CELL OF EXTREMITY Left 02/06/2014   Procedure: WITH PLACEMENT OF A -CELL ;  Surgeon: Wayland Denis, DO;  Location: [facility] [place];  Service: Plastics;  Laterality: Left;  .  APPLICATION OF A-CELL OF EXTREMITY Left 08/09/2014   Procedure: PLACEMENT OF A CELL;  Surgeon: Wayland Denis, DO;  Location: [facility] OR;  Service: Plastics;  Laterality: Left;  . APPLICATION OF WOUND VAC Left 06/16/2013   Procedure: APPLICATION OF WOUND VAC;  Surgeon: Cammy Copa, MD;  Location: [facility] OR;  Service: Orthopedics;  Laterality: Left;  . APPLICATION OF WOUND VAC Left 06/21/2013   Procedure: APPLICATION OF WOUND VAC;  Surgeon: Wayland Denis, DO;  Location: [facility] OR;  Service: Plastics;  Laterality: Left;  . BIOPSY BREAST  07/25/2015   LEFT RADIOACTIVE SEED GUIDED EXCISIONAL BREAST BIOPSY (Left) as a surgical intervention  . EXTERNAL FIXATION LEG Left 06/16/2013   Procedure: EXTERNAL FIXATION LEG;  Surgeon: Cammy Copa, MD;  Location: [facility] OR;  Service: Orthopedics;  Laterality: Left;  . EXTERNAL FIXATION REMOVAL Left 06/21/2013   Procedure: REMOVAL EXTERNAL FIXATION LEG;  Surgeon: Cammy Copa, MD;  Location: [facility] OR;  Service: Orthopedics;  Laterality: Left;  . EYE SURGERY Bilateral    cataract removal  . HARDWARE REMOVAL Left 05/23/2014   Procedure: HARDWARE REMOVAL;  Surgeon: Cammy Copa, MD;  Location: [facility] OR;  Service: Orthopedics;  Laterality: Left;  REMOVAL OF HARDWARE LEFT TIBIA  . I & D EXTREMITY Left 06/16/2013   Procedure: IRRIGATION AND DEBRIDEMENT EXTREMITY;  Surgeon: Cammy Copa, MD;  Location: [facility] OR;  Service: Orthopedics;  Laterality: Left;  . I & D EXTREMITY Left 02/06/2014   Procedure: IRRIGATION AND DEBRIDEMENT LEFT LEG WOUND ;  Surgeon: Wayland Denis, DO;  Location: [facility] [place];  Service: Plastics;  Laterality: Left;  . I & D EXTREMITY Left 08/09/2014   Procedure: IRRIGATION AND DEBRIDEMENT  OF LEFT LOWER LEG;  Surgeon: Wayland Denis, DO;  Location: [facility] OR;  Service: Plastics;  Laterality: Left;  . LUMBAR FUSION  01-02-2009   L3-L5  . RADIOACTIVE SEED GUIDED EXCISIONAL BREAST BIOPSY Left 07/25/2015   Procedure: LEFT RADIOACTIVE SEED GUIDED  EXCISIONAL BREAST BIOPSY;  Surgeon: Emelia Loron, MD;  Location: [facility] OR;  Service: General;  Laterality: Left;  . RE-EXCISION OF BREAST LUMPECTOMY Left 09/05/2015   Procedure: RE-EXCISION OF LEFT BREAST LUMPECTOMY;  Surgeon: Emelia Loron, MD;  Location: [facility] OR;  Service: General;  Laterality: Left;  . SHOULDER OPEN ROTATOR CUFF REPAIR Left 1997  . TIBIA IM NAIL INSERTION Left 06/21/2013   Procedure: INTRAMEDULLARY (IM) NAIL TIBIAL;  Surgeon: Cammy Copa, MD;  Location: [facility] OR;  Service: Orthopedics;  Laterality: Left;  . TONSILLECTOMY  AS CHILD  . TOTAL HIP ARTHROPLASTY Right 06-07-2010  . TRANSTHORACIC ECHOCARDIOGRAM  09-11-2010   DR Jaydon Soroka   LVSF  55-60%/  MILD MV CALCIFICATION WITH NO STENOSIS/  MILD MR & TR / MODERATE LAE/  MODERATE TO SEVERE RAE  . UPPER GI ENDOSCOPY  03-29-2010     No outpatient medications have been marked as taking for the 02/06/20 encounter (Appointment) with Burnell Blanks, MD.     Allergies:   Zosyn [piperacillin sod-tazobactam so], Atorvastatin, Atenolol, Clarithromycin, Codeine sulfate, Levaquin [levofloxacin], Macrodantin, and Oxycodone-acetaminophen   Social History   Tobacco Use  . Smoking status: Never Smoker  . Smokeless tobacco: Never Used  Vaping Use  . Vaping Use: Never used  Substance Use Topics  . Alcohol use: No    Comment: 1 glass occ wine 08/03/14- none in a year  . Drug use: No     Family Hx: The patient's family history includes Cancer in her father; Hypertension in an other family member.  ROS:   Please see the history of present illness.    All other systems reviewed and are negative.   Prior CV studies:   The following studies were reviewed today:  Echo April 2019:  - Left ventricle: The cavity size was normal. Wall thickness was  increased in a pattern of mild LVH. The estimated ejection  fraction was 55%. Indeterminant diastolic function (atrial  fibrillation). Wall motion was normal; there were  no regional  wall motion abnormalities.  - Aortic valve: There was no stenosis.  - Mitral valve: Mildly calcified annulus. There was trivial  regurgitation.  - Left atrium: The atrium was severely dilated.  - Right ventricle: The cavity size was mildly dilated. Systolic  function was normal.  - Right atrium: The atrium was moderately dilated.  - Tricuspid valve: Peak RV-RA gradient (S): 29 mm Hg.  - Pulmonary arteries: PA peak pressure: 32 mm Hg (S).  - Inferior vena cava: The vessel was normal in size. The  respirophasic diameter changes were in the normal range (>= 50%),  consistent with normal central venous pressure.   Labs/Other Tests and Data Reviewed:    EKG:  {EKG/Telemetry Strips Reviewed:[phone]}  Recent Labs: 06/16/2019: TSH 0.66 09/08/2019: ALT 35 10/03/2019: BUN 32; Creatinine, Ser 1.11; Hemoglobin 14.3; Platelets 318; Potassium 3.6; Sodium 135   Recent Lipid Panel Lab Results  Component Value Date/Time   CHOL 116 06/25/2012 04:19 PM   TRIG 82 09/05/2013 03:00 AM   HDL 41.50 06/25/2012 04:19 PM   CHOLHDL 3 06/25/2012 04:19 PM   LDLCALC 66 06/25/2012 04:19 PM   LDLDIRECT 110.3 05/24/2008 10:41 AM    Wt Readings from Last 3 Encounters:  11/28/19 165 lb (74.8 kg)  10/03/19 158 lb 15.2 oz (72.1 kg)  09/28/19 159 lb (72.1 kg)     Objective:    Vital Signs:  There were no vitals taken for this visit.   {[facility] Virtual Exam (Optional):[id number]::"VITAL SIGNS:  reviewed"}  ASSESSMENT & PLAN:    1. Atrial fibrillation, permanent: No palpitations. Heart rate is controlled. Will continue Cardizem, Toprol and Eliquis.      2. HLD: This is followed in primary care. Continue statin  3. Chronic diastolic CHF: LV systolic function is normal by echo in 2018. Weight is stable at home per pt with no LE edema. Continue Lasix.   COVID-19 Education: The signs and symptoms of COVID-19 were discussed with the patient and how to seek care for testing  (follow up with PCP or arrange E-visit).  The importance of social distancing was discussed today.  Time:   Today, I have spent *** minutes with the patient with telehealth technology discussing the above problems.     Medication Adjustments/Labs and Tests Ordered: Current medicines are reviewed at length with the patient  today.  Concerns regarding medicines are outlined above.   Tests Ordered: No orders of the defined types were placed in this encounter.   Medication Changes: No orders of the defined types were placed in this encounter.   Disposition:  Follow up {follow up:15908}  Signed, Lauree Chandler, MD  02/05/2020 8:14 AM    [place]

## 2020-02-06 ENCOUNTER — Telehealth: Payer: Medicare Other | Admitting: Cardiovascular Disease

## 2020-02-06 ENCOUNTER — Other Ambulatory Visit: Payer: Self-pay

## 2020-02-07 DIAGNOSIS — S22050D Wedge compression fracture of T5-T6 vertebra, subsequent encounter for fracture with routine healing: Secondary | ICD-10-CM | POA: Diagnosis not present

## 2020-02-07 DIAGNOSIS — S32020G Wedge compression fracture of second lumbar vertebra, subsequent encounter for fracture with delayed healing: Secondary | ICD-10-CM | POA: Diagnosis not present

## 2020-02-07 DIAGNOSIS — Z483 Aftercare following surgery for neoplasm: Secondary | ICD-10-CM | POA: Diagnosis not present

## 2020-02-07 DIAGNOSIS — Z4801 Encounter for change or removal of surgical wound dressing: Secondary | ICD-10-CM | POA: Diagnosis not present

## 2020-02-07 DIAGNOSIS — S81801D Unspecified open wound, right lower leg, subsequent encounter: Secondary | ICD-10-CM | POA: Diagnosis not present

## 2020-02-07 DIAGNOSIS — S22080D Wedge compression fracture of T11-T12 vertebra, subsequent encounter for fracture with routine healing: Secondary | ICD-10-CM | POA: Diagnosis not present

## 2020-02-09 DIAGNOSIS — S32020G Wedge compression fracture of second lumbar vertebra, subsequent encounter for fracture with delayed healing: Secondary | ICD-10-CM | POA: Diagnosis not present

## 2020-02-09 DIAGNOSIS — Z4801 Encounter for change or removal of surgical wound dressing: Secondary | ICD-10-CM | POA: Diagnosis not present

## 2020-02-09 DIAGNOSIS — S22050D Wedge compression fracture of T5-T6 vertebra, subsequent encounter for fracture with routine healing: Secondary | ICD-10-CM | POA: Diagnosis not present

## 2020-02-09 DIAGNOSIS — Z483 Aftercare following surgery for neoplasm: Secondary | ICD-10-CM | POA: Diagnosis not present

## 2020-02-09 DIAGNOSIS — S22080D Wedge compression fracture of T11-T12 vertebra, subsequent encounter for fracture with routine healing: Secondary | ICD-10-CM | POA: Diagnosis not present

## 2020-02-09 DIAGNOSIS — S81801D Unspecified open wound, right lower leg, subsequent encounter: Secondary | ICD-10-CM | POA: Diagnosis not present

## 2020-02-14 DIAGNOSIS — S22050D Wedge compression fracture of T5-T6 vertebra, subsequent encounter for fracture with routine healing: Secondary | ICD-10-CM | POA: Diagnosis not present

## 2020-02-14 DIAGNOSIS — Z4801 Encounter for change or removal of surgical wound dressing: Secondary | ICD-10-CM | POA: Diagnosis not present

## 2020-02-14 DIAGNOSIS — Z483 Aftercare following surgery for neoplasm: Secondary | ICD-10-CM | POA: Diagnosis not present

## 2020-02-14 DIAGNOSIS — S81801D Unspecified open wound, right lower leg, subsequent encounter: Secondary | ICD-10-CM | POA: Diagnosis not present

## 2020-02-14 DIAGNOSIS — S32020G Wedge compression fracture of second lumbar vertebra, subsequent encounter for fracture with delayed healing: Secondary | ICD-10-CM | POA: Diagnosis not present

## 2020-02-14 DIAGNOSIS — S22080D Wedge compression fracture of T11-T12 vertebra, subsequent encounter for fracture with routine healing: Secondary | ICD-10-CM | POA: Diagnosis not present

## 2020-02-17 DIAGNOSIS — S81801D Unspecified open wound, right lower leg, subsequent encounter: Secondary | ICD-10-CM | POA: Diagnosis not present

## 2020-02-17 DIAGNOSIS — Z4801 Encounter for change or removal of surgical wound dressing: Secondary | ICD-10-CM | POA: Diagnosis not present

## 2020-02-17 DIAGNOSIS — Z483 Aftercare following surgery for neoplasm: Secondary | ICD-10-CM | POA: Diagnosis not present

## 2020-02-17 DIAGNOSIS — S32020G Wedge compression fracture of second lumbar vertebra, subsequent encounter for fracture with delayed healing: Secondary | ICD-10-CM | POA: Diagnosis not present

## 2020-02-17 DIAGNOSIS — S22080D Wedge compression fracture of T11-T12 vertebra, subsequent encounter for fracture with routine healing: Secondary | ICD-10-CM | POA: Diagnosis not present

## 2020-02-17 DIAGNOSIS — S22050D Wedge compression fracture of T5-T6 vertebra, subsequent encounter for fracture with routine healing: Secondary | ICD-10-CM | POA: Diagnosis not present

## 2020-02-27 ENCOUNTER — Ambulatory Visit (INDEPENDENT_AMBULATORY_CARE_PROVIDER_SITE_OTHER): Payer: Medicare Other | Admitting: Internal Medicine

## 2020-02-27 ENCOUNTER — Encounter: Payer: Self-pay | Admitting: Internal Medicine

## 2020-02-27 ENCOUNTER — Other Ambulatory Visit: Payer: Self-pay

## 2020-02-27 DIAGNOSIS — M810 Age-related osteoporosis without current pathological fracture: Secondary | ICD-10-CM

## 2020-02-27 DIAGNOSIS — I10 Essential (primary) hypertension: Secondary | ICD-10-CM | POA: Diagnosis not present

## 2020-02-27 DIAGNOSIS — K5901 Slow transit constipation: Secondary | ICD-10-CM | POA: Diagnosis not present

## 2020-02-27 DIAGNOSIS — Z7901 Long term (current) use of anticoagulants: Secondary | ICD-10-CM

## 2020-02-27 DIAGNOSIS — G4700 Insomnia, unspecified: Secondary | ICD-10-CM | POA: Diagnosis not present

## 2020-02-27 DIAGNOSIS — E039 Hypothyroidism, unspecified: Secondary | ICD-10-CM

## 2020-02-27 DIAGNOSIS — N183 Chronic kidney disease, stage 3 unspecified: Secondary | ICD-10-CM | POA: Diagnosis not present

## 2020-02-27 DIAGNOSIS — M545 Low back pain, unspecified: Secondary | ICD-10-CM

## 2020-02-27 DIAGNOSIS — N2889 Other specified disorders of kidney and ureter: Secondary | ICD-10-CM

## 2020-02-27 DIAGNOSIS — I4891 Unspecified atrial fibrillation: Secondary | ICD-10-CM

## 2020-02-27 LAB — COMPREHENSIVE METABOLIC PANEL
ALT: 13 U/L (ref 0–35)
AST: 22 U/L (ref 0–37)
Albumin: 4.2 g/dL (ref 3.5–5.2)
Alkaline Phosphatase: 81 U/L (ref 39–117)
BUN: 14 mg/dL (ref 6–23)
CO2: 30 mEq/L (ref 19–32)
Calcium: 9.8 mg/dL (ref 8.4–10.5)
Chloride: 100 mEq/L (ref 96–112)
Creatinine, Ser: 0.93 mg/dL (ref 0.40–1.20)
GFR: 53.43 mL/min — ABNORMAL LOW (ref >60.00)
Glucose, Bld: 86 mg/dL (ref 70–99)
Potassium: 3.8 mEq/L (ref 3.5–5.1)
Sodium: 138 mEq/L (ref 135–145)
Total Bilirubin: 1.1 mg/dL (ref 0.2–1.2)
Total Protein: 7.8 g/dL (ref 6.0–8.3)

## 2020-02-27 MED ORDER — DILTIAZEM HCL ER COATED BEADS 120 MG PO CP24
ORAL_CAPSULE | ORAL | 3 refills | Status: DC
Start: 2020-02-27 — End: 2021-02-28

## 2020-02-27 NOTE — Assessment & Plan Note (Signed)
On Levothroid Check TSH 

## 2020-02-27 NOTE — Patient Instructions (Addendum)
[place] Milk of magnesia  Try GLYTONE exfoliating body lotion by Desmond Dike (free acid value 17.5) for scaly skin

## 2020-02-27 NOTE — Progress Notes (Signed)
Subjective:  Patient ID: [name], female    DOB: 1925-09-01  Age: 87 y.o. MRN: [id number]  CC: Follow-up (3 month f/u)   HPI [name] presents for constipation - Linzess was >$400  Follow-up on insomnia, hypertension, atrial fibrillation  Outpatient Medications Prior to Visit  Medication Sig Dispense Refill  . acetaminophen (TYLENOL) 500 MG tablet Take 500 mg by mouth daily as needed for mild pain.     [name] anastrozole (ARIMIDEX) 1 MG tablet Take 1 tablet (1 mg total) by mouth daily. 90 tablet 3  . apixaban (ELIQUIS) 5 MG TABS tablet Take 1 tablet (5 mg total) by mouth 2 (two) times daily. Overdue for follow-up, MUST see MD for FUTURE refills. 180 tablet 0  . B Complex-C (B-COMPLEX WITH VITAMIN C) tablet Take 1 tablet by mouth daily.    . busPIRone (BUSPAR) 15 MG tablet Take 1 tablet (15 mg total) by mouth 2 (two) times daily. 180 tablet 11  . Cholecalciferol (VITAMIN D3) 1000 UNITS tablet Take 1,000 Units by mouth daily.    [name] diltiazem (CARDIZEM CD) 120 MG 24 hr capsule TAKE 1 CAPSULE (120 MG TOTAL) BY MOUTH DAILY. (Patient taking differently: Take 120 mg by mouth daily.) 90 capsule 3  . doxycycline (VIBRA-TABS) 100 MG tablet Take 1 tablet (100 mg total) by mouth daily. 90 tablet 3  . famotidine (PEPCID) 20 MG tablet Take 20 mg by mouth daily.    [name] gabapentin (NEURONTIN) 100 MG capsule Take 1 capsule (100 mg total) by mouth at bedtime. Pain 90 capsule 3  . levothyroxine (SYNTHROID) 25 MCG tablet TAKE 1 TABLET BY MOUTH DAILY FOR THYROID. (Patient taking differently: Take 25 mcg by mouth daily before breakfast.) 30 tablet 11  . linaclotide (LINZESS) 290 MCG CAPS capsule Take 1 capsule (290 mcg total) by mouth daily. 30 capsule 11  . metoprolol succinate (TOPROL-XL) 50 MG 24 hr tablet Take 0.5 tablets (25 mg total) by mouth 2 (two) times daily. Take with or immediately following a meal. 180 tablet 3  . Multiple Vitamins-Minerals (MULTIVITAMIN WITH MINERALS) tablet Take 1 tablet by  mouth daily.    . polyethylene glycol (MIRALAX) 17 g packet Take 17 g by mouth daily. 30 each 0  . promethazine (PHENERGAN) 12.5 MG tablet Take 1 tablet (12.5 mg total) by mouth every 6 (six) hours as needed for nausea or vomiting. 65 tablet 1  . temazepam (RESTORIL) 15 MG capsule Take 1 capsule (15 mg total) by mouth at bedtime as needed for sleep. 90 capsule 1   No facility-administered medications prior to visit.    ROS: Review of Systems  Constitutional: Positive for fatigue. Negative for activity change, appetite change, chills, diaphoresis, fever and unexpected weight change.  HENT: Negative for congestion, dental problem, ear pain, hearing loss, mouth sores, postnasal drip, sinus pressure, sneezing, sore throat and voice change.   Eyes: Negative for pain and visual disturbance.  Respiratory: Negative for cough, chest tightness, wheezing and stridor.   Cardiovascular: Negative for chest pain, palpitations and leg swelling.  Gastrointestinal: Positive for constipation. Negative for abdominal distention, abdominal pain, blood in stool, nausea, rectal pain and vomiting.  Genitourinary: Negative for decreased urine volume, difficulty urinating, dysuria, frequency, hematuria, menstrual problem, vaginal bleeding, vaginal discharge and vaginal pain.  Musculoskeletal: Positive for arthralgias, back pain and gait problem. Negative for joint swelling and neck pain.  Skin: Negative for color change, rash and wound.  Neurological: Negative for dizziness, tremors, syncope, speech difficulty, weakness and  light-headedness.  Hematological: Negative for adenopathy.  Psychiatric/Behavioral: Negative for behavioral problems, confusion, decreased concentration, dysphoric mood, hallucinations, sleep disturbance and suicidal ideas. The patient is not nervous/anxious and is not hyperactive.     Objective:  BP 130/82 (BP Location: Left Arm)   Pulse 68   Temp 97.7 F (36.5 C) (Oral)   Ht 5' 5.5" (1.664 m)    Wt 169 lb (76.7 kg)   SpO2 97%   BMI 27.70 kg/m   BP Readings from Last 3 Encounters:  02/27/20 130/82  11/28/19 134/80  11/14/19 (!) 168/84    Wt Readings from Last 3 Encounters:  02/27/20 169 lb (76.7 kg)  11/28/19 165 lb (74.8 kg)  10/03/19 158 lb 15.2 oz (72.1 kg)    Physical Exam Constitutional:      General: She is not in acute distress.    Appearance: She is well-developed.  HENT:     Head: Normocephalic.     Right Ear: External ear normal.     Left Ear: External ear normal.     Nose: Nose normal.     Mouth/Throat:     Mouth: Oropharynx is clear and moist.  Eyes:     General:        Right eye: No discharge.        Left eye: No discharge.     Conjunctiva/sclera: Conjunctivae normal.     Pupils: Pupils are equal, round, and reactive to light.  Neck:     Thyroid: No thyromegaly.     Vascular: No JVD.     Trachea: No tracheal deviation.  Cardiovascular:     Rate and Rhythm: Normal rate and regular rhythm.     Heart sounds: Normal heart sounds.  Pulmonary:     Effort: No respiratory distress.     Breath sounds: No stridor. No wheezing.  Abdominal:     General: Bowel sounds are normal. There is no distension.     Palpations: Abdomen is soft. There is no mass.     Tenderness: There is no abdominal tenderness. There is no guarding or rebound.  Musculoskeletal:        General: Tenderness present. No edema.     Cervical back: Normal range of motion and neck supple.  Lymphadenopathy:     Cervical: No cervical adenopathy.  Skin:    Findings: No erythema or rash.  Neurological:     Mental Status: She is oriented to person, place, and time.     Cranial Nerves: No cranial nerve deficit.     Motor: No abnormal muscle tone.     Coordination: Coordination normal.     Gait: Gait abnormal.     Deep Tendon Reflexes: Reflexes normal.  Psychiatric:        Mood and Affect: Mood and affect normal.        Behavior: Behavior normal.        Thought Content: Thought  content normal.        Judgment: Judgment normal.   using a walker Scaly skin on distal legs Lab Results  Component Value Date   WBC 7.9 10/03/2019   HGB 14.3 10/03/2019   HCT 43.9 10/03/2019   PLT 318 10/03/2019   GLUCOSE 103 (H) 10/03/2019   CHOL 116 06/25/2012   TRIG 82 09/05/2013   HDL 41.50 06/25/2012   LDLDIRECT 110.3 05/24/2008   LDLCALC 66 06/25/2012   ALT 35 09/08/2019   AST 21 09/08/2019   NA 135 10/03/2019   K 3.6 10/03/2019  CL 90 (L) 10/03/2019   CREATININE 1.11 (H) 10/03/2019   BUN 32 (H) 10/03/2019   CO2 30 10/03/2019   TSH 0.66 06/16/2019   INR 1.33 05/23/2017   HGBA1C 5.9 (H) 10/07/2018    CT Hip Right Wo Contrast  Result Date: 11/14/2019 CLINICAL DATA:  Right hip pain EXAM: CT OF THE RIGHT HIP WITHOUT CONTRAST TECHNIQUE: Multidetector CT imaging of the right hip was performed according to the standard protocol. Multiplanar CT image reconstructions were also generated. COMPARISON:  X-ray 11/14/2019 FINDINGS: Bones/Joint/Cartilage Status post right total hip arthroplasty. Arthroplasty components are in their expected alignment. No periprosthetic fracture is identified. No abnormal periprosthetic lucency to suggest hardware loosening. No periprosthetic fluid collection is seen. The visualized portion of the bony right hemipelvis is intact. There is arthropathy of the right SI joint and pubic symphysis. Ligaments Suboptimally assessed by CT. Muscles and Tendons There is some atrophy within the right gluteus medius and minimus muscle. No acute musculotendinous abnormality by CT. Soft tissues No soft tissue fluid collection or hematoma. Diverticulosis is seen within the visualized portion of the sigmoid colon. IMPRESSION: Status post right total hip arthroplasty without periprosthetic fracture or evidence of hardware complication. Electronically Signed   By: Davina Poke D.O.   On: 11/14/2019 09:41   DG Hip Unilat W or Wo Pelvis 2-3 Views Right  Result Date:  11/14/2019 CLINICAL DATA:  [place] 6 weeks ago.  Right hip pain. EXAM: DG HIP (WITH OR WITHOUT PELVIS) 2-3V RIGHT COMPARISON:  10/03/2019 FINDINGS: Previous right hip replacement continues to have a good appearance. No evidence of regional fracture or hardware complication. Other bones of the pelvis appear normal. IMPRESSION: No acute or traumatic finding. Previous right hip replacement. Electronically Signed   By: Nelson Chimes M.D.   On: 11/14/2019 08:31    Assessment & Plan:    Walker Kehr, MD

## 2020-02-27 NOTE — Assessment & Plan Note (Signed)
Cont w/Gabapentin at hs, Tylenol

## 2020-02-27 NOTE — Assessment & Plan Note (Addendum)
Rate controlled On Eliquis, Toprol XL, Diltiazem 

## 2020-02-27 NOTE — Assessment & Plan Note (Signed)
Temazepam as needed at night  Potential benefits of a long term benzodiazepines  use as well as potential risks  and complications were explained to the patient and were aknowledged.

## 2020-02-27 NOTE — Assessment & Plan Note (Addendum)
Continue with metoprolol, diltiazem Check c-Met

## 2020-02-27 NOTE — Assessment & Plan Note (Addendum)
Linzess is very expensive over $400.  She can try combination of MiraLAX, milk of magnesia, senna and MiraLAX instead

## 2020-02-27 NOTE — Assessment & Plan Note (Signed)
GFR 43 Try to hydrate yourself better

## 2020-02-27 NOTE — Assessment & Plan Note (Signed)
Continue with Eliquis. ?

## 2020-03-05 NOTE — Progress Notes (Signed)
Cardiology Office Note   Date:  03/07/2020   ID:  [name], DOB [birth year], MRN [id number]  PCP:  [name], MD  Cardiologist: Dr. Lauree Chandler, MD  Chief Complaint  Patient presents with  . Follow-up   History of Present Illness: [name] is a 87 y.o. female who presents for overdue follow-up, seen for Dr. Angelena Form.  [name] Has a History of HTN, persistent Atrial fibrillation/flutter, chronic diastolic CHF, known RBBB, iron deficiency anemia, diverticulosis, history of breast CA, GERD, HLD and remote ischemic colitis with UGB.  She was previously anticoagulated with Coumadin however remotely had melena/anemia with endoscopy showing gastric ulcers therefore she was transitioned to Eliquis therapy.  She has chronic lower extremity edema controlled with Lasix.  Echocardiogram from 04/2016 showed mild LVH, LVEF at 65%, trivial MR, severe LAE, mildly dilated RV with normal function.  In regards to her atrial fibrillation, plan has been to rate control.  On last cardiology follow-up, she was doing well from a CV standpoint.  She was living at an assisted living facility, [facility] however her husband was in a separate area due to progressive dementia.  Today she is here with her daughter. She reports that she has been doing very well from a CV standpoint. Her husband is still living in the same community however is separate from her due to his dementia. She is quite tearful when speaking about this today. She misses her interactions with other community members and wishes things would get back to normal. Otherwise she has no complaints today. She denies chest pain, palpitations, PND, dizziness, falls, presyncope or syncope.    Past Medical History:  Diagnosis Date  . Anxiety    denies  . Atrial flutter ([place])   . Bilateral edema of lower extremity   . Breast cancer ([place])    left breast  . Chronic constipation   . Chronic diastolic CHF (congestive  heart failure) ([place])   . Diverticulosis of colon   . GERD (gastroesophageal reflux disease)   . H/O hiatal hernia   . History of cellulitis    LEFT LOWER LEG --  SEPT 2015  . History of colitis    DX  2003  WITH ISCHEMIC COLITIS  . History of diverticulitis of colon    perferated diverticulitis w/ abscess 08-25-2013 drain placed --  resolved without surgical intervention  . History of GI bleed    2011--- UPPER SECONARY GASTRIC ULCER FROM NSAID USE  . History of Helicobacter pylori infection    2011  . History of ulcer of lower limb    2012   RIGHT LOWER EXTREMITIY--  STATSIS ULCER  . Hyperlipidemia   . Hypertension   . Hypothyroidism   . Iron deficiency anemia 2011   elevated MCV  . LBP (low back pain)    severe lumbar spondylosis s/p L3/L4,L4/L5 fusion 12/10  . Lumbar spondylolysis   . OA (osteoarthritis)    "hands" (08/25/2013)  . Open wound of left lower leg   . Osteopenia   . Persistent atrial fibrillation ([place])    CARDIOLOGIST-   DR MCALANY  . RBBB   . Urinary frequency     Past Surgical History:  Procedure Laterality Date  . APPLICATION OF A-CELL OF EXTREMITY Left 06/21/2013   Procedure: APPLICATION OF A-CELL OF EXTREMITY;  Surgeon: Theodoro Kos, DO;  Location: [facility];  Service: Plastics;  Laterality: Left;  . APPLICATION OF A-CELL OF EXTREMITY Left 08/22/2013   Procedure:  APPLICATION OF A-CELL/VAC TO LOWER LEFT LEG WOUND;  Surgeon: Theodoro Kos, DO;  Location: [facility];  Service: Plastics;  Laterality: Left;  . APPLICATION OF A-CELL OF EXTREMITY Left 02/06/2014   Procedure: WITH PLACEMENT OF A -CELL ;  Surgeon: Theodoro Kos, DO;  Location: [facility];  Service: Plastics;  Laterality: Left;  . APPLICATION OF A-CELL OF EXTREMITY Left 08/09/2014   Procedure: PLACEMENT OF A CELL;  Surgeon: Theodoro Kos, DO;  Location: [facility];  Service: Plastics;  Laterality: Left;  . APPLICATION OF WOUND VAC Left 06/16/2013   Procedure: APPLICATION OF WOUND  VAC;  Surgeon: Meredith Pel, MD;  Location: [facility];  Service: Orthopedics;  Laterality: Left;  . APPLICATION OF WOUND VAC Left 06/21/2013   Procedure: APPLICATION OF WOUND VAC;  Surgeon: Theodoro Kos, DO;  Location: [facility];  Service: Plastics;  Laterality: Left;  . BIOPSY BREAST  07/25/2015   LEFT RADIOACTIVE SEED GUIDED EXCISIONAL BREAST BIOPSY (Left) as a surgical intervention  . EXTERNAL FIXATION LEG Left 06/16/2013   Procedure: EXTERNAL FIXATION LEG;  Surgeon: Meredith Pel, MD;  Location: [facility];  Service: Orthopedics;  Laterality: Left;  . EXTERNAL FIXATION REMOVAL Left 06/21/2013   Procedure: REMOVAL EXTERNAL FIXATION LEG;  Surgeon: Meredith Pel, MD;  Location: [facility];  Service: Orthopedics;  Laterality: Left;  . EYE SURGERY Bilateral    cataract removal  . HARDWARE REMOVAL Left 05/23/2014   Procedure: HARDWARE REMOVAL;  Surgeon: Meredith Pel, MD;  Location: [facility];  Service: Orthopedics;  Laterality: Left;  REMOVAL OF HARDWARE LEFT TIBIA  . I & D EXTREMITY Left 06/16/2013   Procedure: IRRIGATION AND DEBRIDEMENT EXTREMITY;  Surgeon: Meredith Pel, MD;  Location: [facility];  Service: Orthopedics;  Laterality: Left;  . I & D EXTREMITY Left 02/06/2014   Procedure: IRRIGATION AND DEBRIDEMENT LEFT LEG WOUND ;  Surgeon: Theodoro Kos, DO;  Location: [facility];  Service: Plastics;  Laterality: Left;  . I & D EXTREMITY Left 08/09/2014   Procedure: IRRIGATION AND DEBRIDEMENT  OF LEFT LOWER LEG;  Surgeon: Theodoro Kos, DO;  Location: [facility];  Service: Plastics;  Laterality: Left;  . LUMBAR FUSION  01-02-2009   L3-L5  . RADIOACTIVE SEED GUIDED EXCISIONAL BREAST BIOPSY Left 07/25/2015   Procedure: LEFT RADIOACTIVE SEED GUIDED EXCISIONAL BREAST BIOPSY;  Surgeon: Rolm Bookbinder, MD;  Location: [facility];  Service: General;  Laterality: Left;  . RE-EXCISION OF BREAST LUMPECTOMY Left 09/05/2015   Procedure: RE-EXCISION OF LEFT BREAST LUMPECTOMY;  Surgeon: Rolm Bookbinder, MD;   Location: [facility];  Service: General;  Laterality: Left;  . SHOULDER OPEN ROTATOR CUFF REPAIR Left 1997  . TIBIA IM NAIL INSERTION Left 06/21/2013   Procedure: INTRAMEDULLARY (IM) NAIL TIBIAL;  Surgeon: Meredith Pel, MD;  Location: [facility];  Service: Orthopedics;  Laterality: Left;  . TONSILLECTOMY  AS CHILD  . TOTAL HIP ARTHROPLASTY Right 06-07-2010  . TRANSTHORACIC ECHOCARDIOGRAM  09-11-2010   DR MCALHANY   LVSF  55-60%/  MILD MV CALCIFICATION WITH NO STENOSIS/  MILD MR & TR / MODERATE LAE/  MODERATE TO SEVERE RAE  . UPPER GI ENDOSCOPY  03-29-2010     Current Outpatient Medications  Medication Sig Dispense Refill  . acetaminophen (TYLENOL) 500 MG tablet Take 500 mg by mouth daily as needed for mild pain.     [name] anastrozole (ARIMIDEX) 1 MG tablet Take 1 tablet (1 mg total) by mouth daily. 90 tablet 3  .  apixaban (ELIQUIS) 5 MG TABS tablet Take 1 tablet (5 mg total) by mouth 2 (two) times daily. Overdue for follow-up, MUST see MD for FUTURE refills. 180 tablet 0  . B Complex-C (B-COMPLEX WITH VITAMIN C) tablet Take 1 tablet by mouth daily.    . busPIRone (BUSPAR) 15 MG tablet Take 1 tablet (15 mg total) by mouth 2 (two) times daily. 180 tablet 11  . Cholecalciferol (VITAMIN D3) 1000 UNITS tablet Take 1,000 Units by mouth daily.    [name] diltiazem (CARDIZEM CD) 120 MG 24 hr capsule TAKE 1 CAPSULE (120 MG TOTAL) BY MOUTH DAILY. 90 capsule 3  . doxycycline (VIBRA-TABS) 100 MG tablet Take 1 tablet (100 mg total) by mouth daily. 90 tablet 3  . famotidine (PEPCID) 20 MG tablet Take 20 mg by mouth daily.    [name] gabapentin (NEURONTIN) 100 MG capsule Take 1 capsule (100 mg total) by mouth at bedtime. Pain 90 capsule 3  . levothyroxine (SYNTHROID) 25 MCG tablet TAKE 1 TABLET BY MOUTH DAILY FOR THYROID. 30 tablet 11  . linaclotide (LINZESS) 290 MCG CAPS capsule Take 1 capsule (290 mcg total) by mouth daily. 30 capsule 11  . metoprolol succinate (TOPROL-XL) 50 MG 24 hr tablet Take 0.5 tablets (25 mg total) by  mouth 2 (two) times daily. Take with or immediately following a meal. 180 tablet 3  . Multiple Vitamins-Minerals (MULTIVITAMIN WITH MINERALS) tablet Take 1 tablet by mouth daily.    . polyethylene glycol (MIRALAX) 17 g packet Take 17 g by mouth daily. 30 each 0  . temazepam (RESTORIL) 15 MG capsule Take 1 capsule (15 mg total) by mouth at bedtime as needed for sleep. 90 capsule 1   No current facility-administered medications for this visit.    Allergies:   Zosyn [piperacillin sod-tazobactam so], Atorvastatin, Atenolol, Clarithromycin, Codeine sulfate, Levaquin [levofloxacin], Macrodantin, and Oxycodone-acetaminophen    Social History:  The patient  reports that she has never smoked. She has never used smokeless tobacco. She reports that she does not drink alcohol and does not use drugs.   Family History:  The patient's family history includes Cancer in her father; Hypertension in an other family member.   ROS:  Please see the history of present illness.   Otherwise, review of systems are positive for none.   All other systems are reviewed and negative.    PHYSICAL EXAM: VS:  BP (!) 122/58   Pulse 72   Ht 5\' 4"  (1.626 m)   Wt 167 lb (75.8 kg)   SpO2 96%   BMI 28.67 kg/m  , BMI Body mass index is 28.67 kg/m.   General: Elderly, NAD Lungs:Clear to ausculation bilaterally. No wheezes, rales, or rhonchi. Breathing is unlabored. Cardiovascular: Irregularly irregular. No murmurs.  Extremities: Mild 1+ BLE edema. No clubbing or cyanosis. Radial pulses 2+ bilaterally Neuro: Alert and oriented. No focal deficits. No facial asymmetry. MAE spontaneously. Psych: Responds to questions appropriately with normal affect.    EKG:  EKG is not ordered today.  Recent Labs: 06/16/2019: TSH 0.66 10/03/2019: Hemoglobin 14.3; Platelets 318 02/27/2020: ALT 13; BUN 14; Creatinine, Ser 0.93; Potassium 3.8; Sodium 138   Lipid Panel    Component Value Date/Time   CHOL 116 06/25/2012 1619   TRIG 82  09/05/2013 0300   HDL 41.50 06/25/2012 1619   CHOLHDL 3 06/25/2012 1619   VLDL 8.2 06/25/2012 1619   LDLCALC 66 06/25/2012 1619   LDLDIRECT 110.3 05/24/2008 1041    Wt Readings from Last 3 Encounters:  03/07/20 167 lb (75.8 kg)  02/27/20 169 lb (76.7 kg)  11/28/19 165 lb (74.8 kg)    ASSESSMENT AND PLAN:  1.  Persistent atrial fibrillation/flutter: -Plan to continue with rate controlling methods with Toprol at 25mg  PO QD -Anticoagulated with Eliquis>>still qualifies for full dose AC at 5mg  BID -Rate stable today at 72bpm  2.  HTN: -Stable, 122/58 -Continue current regimen   3.  Chronic diastolic CHF: -Taken off PRN Lasix last year per patient report. Has some mild LE edema however she has deferred adding low dose Lasix back to regimen -No other HF symptoms   Current medicines are reviewed at length with the patient today.  The patient does not have concerns regarding medicines.  The following changes have been made:  no change  Labs/ tests ordered today include: None  No orders of the defined types were placed in this encounter.    Disposition:   FU with Dr. Angelena Form in 1 year  Signed, Kathyrn Drown, NP  03/07/2020 11:58 AM    [address], [place], [place]  [address] Phone: [phone]; Fax: [phone]

## 2020-03-07 ENCOUNTER — Ambulatory Visit (INDEPENDENT_AMBULATORY_CARE_PROVIDER_SITE_OTHER): Payer: Medicare Other | Admitting: Cardiology

## 2020-03-07 ENCOUNTER — Encounter: Payer: Self-pay | Admitting: Cardiology

## 2020-03-07 ENCOUNTER — Other Ambulatory Visit: Payer: Self-pay

## 2020-03-07 VITALS — BP 122/58 | HR 72 | Ht 64.0 in | Wt 167.0 lb

## 2020-03-07 DIAGNOSIS — I872 Venous insufficiency (chronic) (peripheral): Secondary | ICD-10-CM | POA: Diagnosis not present

## 2020-03-07 DIAGNOSIS — I4819 Other persistent atrial fibrillation: Secondary | ICD-10-CM | POA: Diagnosis not present

## 2020-03-07 DIAGNOSIS — I5032 Chronic diastolic (congestive) heart failure: Secondary | ICD-10-CM | POA: Diagnosis not present

## 2020-03-07 DIAGNOSIS — I10 Essential (primary) hypertension: Secondary | ICD-10-CM

## 2020-03-07 NOTE — Patient Instructions (Signed)
Medication Instructions:  Your physician recommends that you continue on your current medications as directed. Please refer to the Current Medication list given to you today.  *If you need a refill on your cardiac medications before your next appointment, please call your pharmacy*   Lab Work: NONE If you have labs (blood work) drawn today and your tests are completely normal, you will receive your results only by: MyChart Message (if you have MyChart) OR A paper copy in the mail If you have any lab test that is abnormal or we need to change your treatment, we will call you to review the results.   Testing/Procedures: NONE   Follow-Up: At [facility], you and your health needs are our priority.  As part of our continuing mission to provide you with exceptional heart care, we have created designated Provider Care Teams.  These Care Teams include your primary Cardiologist (physician) and Advanced Practice Providers (APPs -  Physician Assistants and Nurse Practitioners) who all work together to provide you with the care you need, when you need it.  We recommend signing up for the patient portal called "MyChart".  Sign up information is provided on this After Visit Summary.  MyChart is used to connect with patients for Virtual Visits (Telemedicine).  Patients are able to view lab/test results, encounter notes, upcoming appointments, etc.  Non-urgent messages can be sent to your provider as well.   To learn more about what you can do with MyChart, go to [email or web address].    Your next appointment:   1 year(s)  The format for your next appointment:   In Person  Provider:   You may see Christopher McAlhany, MD or one of the following Advanced Practice Providers on your designated Care Team:   Dayna Dunn, PA-C Michele Lenze, PA-C    

## 2020-03-08 DIAGNOSIS — Z6828 Body mass index (BMI) 28.0-28.9, adult: Secondary | ICD-10-CM | POA: Diagnosis not present

## 2020-03-08 DIAGNOSIS — I10 Essential (primary) hypertension: Secondary | ICD-10-CM | POA: Diagnosis not present

## 2020-03-08 DIAGNOSIS — S22050A Wedge compression fracture of T5-T6 vertebra, initial encounter for closed fracture: Secondary | ICD-10-CM | POA: Diagnosis not present

## 2020-03-22 DIAGNOSIS — Z85828 Personal history of other malignant neoplasm of skin: Secondary | ICD-10-CM | POA: Diagnosis not present

## 2020-03-22 DIAGNOSIS — L0889 Other specified local infections of the skin and subcutaneous tissue: Secondary | ICD-10-CM | POA: Diagnosis not present

## 2020-03-22 DIAGNOSIS — L02415 Cutaneous abscess of right lower limb: Secondary | ICD-10-CM | POA: Diagnosis not present

## 2020-03-22 DIAGNOSIS — L218 Other seborrheic dermatitis: Secondary | ICD-10-CM | POA: Diagnosis not present

## 2020-03-30 ENCOUNTER — Other Ambulatory Visit: Payer: Self-pay | Admitting: *Deleted

## 2020-03-30 MED ORDER — GABAPENTIN 100 MG PO CAPS
100.0000 mg | ORAL_CAPSULE | Freq: Every day | ORAL | 3 refills | Status: DC
Start: 2020-03-30 — End: 2021-05-02

## 2020-03-30 MED ORDER — GABAPENTIN 100 MG PO CAPS
100.0000 mg | ORAL_CAPSULE | Freq: Every day | ORAL | 3 refills | Status: DC
Start: 2020-03-30 — End: 2020-03-30

## 2020-04-16 ENCOUNTER — Other Ambulatory Visit: Payer: Self-pay

## 2020-04-16 ENCOUNTER — Other Ambulatory Visit: Payer: Self-pay | Admitting: Cardiovascular Disease

## 2020-04-16 MED ORDER — APIXABAN 5 MG PO TABS
5.0000 mg | ORAL_TABLET | Freq: Two times a day (BID) | ORAL | 3 refills | Status: DC
Start: 2020-04-16 — End: 2020-04-16

## 2020-04-16 MED ORDER — APIXABAN 5 MG PO TABS: 5.0000 mg | ORAL_TABLET | Freq: Two times a day (BID) | ORAL | 3 refills | Status: DC

## 2020-04-16 NOTE — Telephone Encounter (Signed)
Prescription refill request for Eliquis received. Indication: Afib Last office visit: Mcdan[place], 03/07/2020 Scr: 0.93, 02/27/2020 Age: 87 yo  Weight: 75.8 kg   Pt is on the correct dose of Eliquis per dosing criteria, prescription refill sent for Eliquis 5mg  BID.

## 2020-04-16 NOTE — Addendum Note (Signed)
Addended by: Brynda Peon on: 04/16/2020 01:00 PM   Modules accepted: Orders

## 2020-04-16 NOTE — Telephone Encounter (Signed)
Pt last saw Kathyrn Drown, NP on 03/07/20, last labs 02/27/20 Creat 0.93, age 87, weight 75.8kg, based on specified criteria pt is on appropriate dosage of Eliquis 5mg  BID for afib.  Will refill rx.

## 2020-04-16 NOTE — Addendum Note (Signed)
Addended by: Brynda Peon on: 04/16/2020 01:56 PM   Modules accepted: Orders

## 2020-04-19 ENCOUNTER — Other Ambulatory Visit: Payer: Self-pay | Admitting: Internal Medicine

## 2020-04-19 DIAGNOSIS — C44722 Squamous cell carcinoma of skin of right lower limb, including hip: Secondary | ICD-10-CM | POA: Diagnosis not present

## 2020-04-19 DIAGNOSIS — Z85828 Personal history of other malignant neoplasm of skin: Secondary | ICD-10-CM | POA: Diagnosis not present

## 2020-04-19 DIAGNOSIS — C44729 Squamous cell carcinoma of skin of left lower limb, including hip: Secondary | ICD-10-CM | POA: Diagnosis not present

## 2020-04-19 DIAGNOSIS — D485 Neoplasm of uncertain behavior of skin: Secondary | ICD-10-CM | POA: Diagnosis not present

## 2020-04-20 ENCOUNTER — Other Ambulatory Visit: Payer: Self-pay | Admitting: *Deleted

## 2020-04-20 NOTE — Telephone Encounter (Signed)
Duplicate refill Temazepam already sent.Marland KitchenJohny Chess

## 2020-05-01 ENCOUNTER — Other Ambulatory Visit: Payer: Self-pay | Admitting: Oncology

## 2020-05-01 ENCOUNTER — Telehealth: Payer: Self-pay | Admitting: Oncology

## 2020-05-01 NOTE — Telephone Encounter (Signed)
Scheduled appt per 4/12 sch msg. Called pt, no answer. Left msg with appt date and time.  

## 2020-05-03 DIAGNOSIS — Z23 Encounter for immunization: Secondary | ICD-10-CM | POA: Diagnosis not present

## 2020-05-23 ENCOUNTER — Telehealth: Payer: Self-pay | Admitting: Internal Medicine

## 2020-05-23 NOTE — Telephone Encounter (Signed)
Patient due for Prolia after 5.9.22 Benefits investigation initiated 5.4.22

## 2020-06-03 NOTE — Progress Notes (Signed)
[place]  [phone] [phone]     ID: ELLENORA TALTON DOB: [birth year]  MR#: [id number]  HMC#:[id number]  Patient Care Team: [name], MD as PCP - General Angelena Form Annita Brod, MD as PCP - Cardiology (Cardiology) Rolm Bookbinder, MD as Consulting Physician (General Surgery) Vinny Taranto, Virgie Dad, MD as Consulting Physician (Oncology) Burnell Blanks, MD as Consulting Physician (Cardiology) Jarome Matin, MD as Consulting Physician (Dermatology) Marlou Sa Tonna Corner, MD as Consulting Physician (Orthopedic Surgery) Cameron Sprang, MD as Consulting Physician (Neurology) OTHER MD:  CHIEF COMPLAINT: Estrogen receptor positive invasive breast cancer  CURRENT TREATMENT: Completing 5 years anastrozole   INTERVAL HISTORY: Teyla is seen today for follow-up of her estrogen receptor positive breast cancer.  She is accompanied by her daughter visiting from out of town.  She continues on anastrozole.  She tolerates this with no side effects that she is aware of.  The last mammogram we have records for is from July 2019 at [facility] showing a density category A.  Nijae tells me there has been too much on her plate and she has neglected her health to some extent.  Quite aside from the breast cancer issues she has developed squamous cell skin cancers in both lower extremities.  She thought the appointment here was for that but we really do not have any records indicating that.  She thought she was meeting with radiation oncology as well but also that had not been set up.   REVIEW OF SYSTEMS: Valjean is depressed because her husband is severely demented, and now in the "care center" at [facility].  She continues in the independent section.  She gets she gets up in the morning takes care of her own needs and then spends at least 3 hours a day with him.  She walks within the compound and to get meals.  She does not think she has lost any weight.  She  feels very isolated and really misses her husband who is really "worse than gone" (.     COVID 19 VACCINATION STATUS: fully vaccinated [employer]), with booster 10/2019   BREAST CANCER HISTORY: From the original intake note:  Zamariyah noted a change in her left breast and had bilateral screening mammography at [facility] 06/07/2015, with tomosynthesis. Breast density was category A. There was a new oval mass in the left breast central to the nipple. Left breast ultrasound the same day confirmed a 3.4 cm oval mass in the left breast upper outer quadrant, correlating with the mammography. The left axilla was sonographically benign.  Biopsy of the left breast mass in question 06/11/2015 showed ([id number]) a papillary lesion, consistent with an intraductal papilloma, but due to fragmentation the edges of the lesion could not be assessed.  Accordingly the patient was referred to surgery, and after appropriate discussion underwent left lumpectomy 07/25/2015. The final pathology ([id number]) showed 2 foci of invasive ductal carcinoma, grade 2, measuring 3.0 cm, and grade 1, measuring 0.9 cm. There was also ductal carcinoma in situ. The invasive tumor was present at the superior margin, and ductal carcinoma in situ was focally less than 0.1 cm to the same margin. There was evidence of lymphovascular invasion. The prognostic panel showed estrogen receptor 100% positive, progesterone receptor 100% positive, both with strong staining intensity, for both lesions. HER-2 was also negative for both lesions, the signals ratio being 1.45-1.65, and the number per cell 2.10-2.80.  The patient's subsequent history is as detailed below   PAST MEDICAL  HISTORY: Past Medical History:  Diagnosis Date  . Anxiety    denies  . Atrial flutter ([place])   . Bilateral edema of lower extremity   . Breast cancer ([place])    left breast  . Chronic constipation   . Chronic diastolic CHF (congestive heart failure) ([place])   .  Diverticulosis of colon   . GERD (gastroesophageal reflux disease)   . H/O hiatal hernia   . History of cellulitis    LEFT LOWER LEG --  SEPT 2015  . History of colitis    DX  2003  WITH ISCHEMIC COLITIS  . History of diverticulitis of colon    perferated diverticulitis w/ abscess 08-25-2013 drain placed --  resolved without surgical intervention  . History of GI bleed    2011--- UPPER SECONARY GASTRIC ULCER FROM NSAID USE  . History of Helicobacter pylori infection    2011  . History of ulcer of lower limb    2012   RIGHT LOWER EXTREMITIY--  STATSIS ULCER  . Hyperlipidemia   . Hypertension   . Hypothyroidism   . Iron deficiency anemia 2011   elevated MCV  . LBP (low back pain)    severe lumbar spondylosis s/p L3/L4,L4/L5 fusion 12/10  . Lumbar spondylolysis   . OA (osteoarthritis)    "hands" (08/25/2013)  . Open wound of left lower leg   . Osteopenia   . Persistent atrial fibrillation ([place])    CARDIOLOGIST-   DR MCALANY  . RBBB   . Urinary frequency     PAST SURGICAL HISTORY: Past Surgical History:  Procedure Laterality Date  . APPLICATION OF A-CELL OF EXTREMITY Left 06/21/2013   Procedure: APPLICATION OF A-CELL OF EXTREMITY;  Surgeon: Theodoro Kos, DO;  Location: [place];  Service: Plastics;  Laterality: Left;  . APPLICATION OF A-CELL OF EXTREMITY Left 08/22/2013   Procedure: APPLICATION OF A-CELL/VAC TO LOWER LEFT LEG WOUND;  Surgeon: Theodoro Kos, DO;  Location: [place];  Service: Plastics;  Laterality: Left;  . APPLICATION OF A-CELL OF EXTREMITY Left 02/06/2014   Procedure: WITH PLACEMENT OF A -CELL ;  Surgeon: Theodoro Kos, DO;  Location: [place];  Service: Plastics;  Laterality: Left;  . APPLICATION OF A-CELL OF EXTREMITY Left 08/09/2014   Procedure: PLACEMENT OF A CELL;  Surgeon: Theodoro Kos, DO;  Location: [place];  Service: Plastics;  Laterality: Left;  . APPLICATION OF WOUND VAC Left 06/16/2013   Procedure: APPLICATION OF WOUND  VAC;  Surgeon: Meredith Pel, MD;  Location: [place];  Service: Orthopedics;  Laterality: Left;  . APPLICATION OF WOUND VAC Left 06/21/2013   Procedure: APPLICATION OF WOUND VAC;  Surgeon: Theodoro Kos, DO;  Location: [place];  Service: Plastics;  Laterality: Left;  . BIOPSY BREAST  07/25/2015   LEFT RADIOACTIVE SEED GUIDED EXCISIONAL BREAST BIOPSY (Left) as a surgical intervention  . EXTERNAL FIXATION LEG Left 06/16/2013   Procedure: EXTERNAL FIXATION LEG;  Surgeon: Meredith Pel, MD;  Location: [place];  Service: Orthopedics;  Laterality: Left;  . EXTERNAL FIXATION REMOVAL Left 06/21/2013   Procedure: REMOVAL EXTERNAL FIXATION LEG;  Surgeon: Meredith Pel, MD;  Location: [place];  Service: Orthopedics;  Laterality: Left;  . EYE SURGERY Bilateral    cataract removal  . HARDWARE REMOVAL Left 05/23/2014   Procedure: HARDWARE REMOVAL;  Surgeon: Meredith Pel, MD;  Location: [place];  Service: Orthopedics;  Laterality: Left;  REMOVAL OF HARDWARE LEFT TIBIA  . I & D EXTREMITY  Left 06/16/2013   Procedure: IRRIGATION AND DEBRIDEMENT EXTREMITY;  Surgeon: Meredith Pel, MD;  Location: [place];  Service: Orthopedics;  Laterality: Left;  . I & D EXTREMITY Left 02/06/2014   Procedure: IRRIGATION AND DEBRIDEMENT LEFT LEG WOUND ;  Surgeon: Theodoro Kos, DO;  Location: [place];  Service: Plastics;  Laterality: Left;  . I & D EXTREMITY Left 08/09/2014   Procedure: IRRIGATION AND DEBRIDEMENT  OF LEFT LOWER LEG;  Surgeon: Theodoro Kos, DO;  Location: [place];  Service: Plastics;  Laterality: Left;  . LUMBAR FUSION  01-02-2009   L3-L5  . RADIOACTIVE SEED GUIDED EXCISIONAL BREAST BIOPSY Left 07/25/2015   Procedure: LEFT RADIOACTIVE SEED GUIDED EXCISIONAL BREAST BIOPSY;  Surgeon: Rolm Bookbinder, MD;  Location: [place];  Service: General;  Laterality: Left;  . RE-EXCISION OF BREAST LUMPECTOMY Left 09/05/2015   Procedure: RE-EXCISION OF LEFT BREAST LUMPECTOMY;  Surgeon: Rolm Bookbinder, MD;   Location: [place];  Service: General;  Laterality: Left;  . SHOULDER OPEN ROTATOR CUFF REPAIR Left 1997  . TIBIA IM NAIL INSERTION Left 06/21/2013   Procedure: INTRAMEDULLARY (IM) NAIL TIBIAL;  Surgeon: Meredith Pel, MD;  Location: [place];  Service: Orthopedics;  Laterality: Left;  . TONSILLECTOMY  AS CHILD  . TOTAL HIP ARTHROPLASTY Right 06-07-2010  . TRANSTHORACIC ECHOCARDIOGRAM  09-11-2010   DR MCALHANY   LVSF  55-60%/  MILD MV CALCIFICATION WITH NO STENOSIS/  MILD MR & TR / MODERATE LAE/  MODERATE TO SEVERE RAE  . UPPER GI ENDOSCOPY  03-29-2010    FAMILY HISTORY Family History  Problem Relation Age of Onset  . Cancer Father   . Hypertension Other   The patient's father died at the age of 18 from heart problems. The patient's mother died at the age of 1 with a ruptured aneurysm. The patient had no brothers, 1 sister. There is no history of breast or ovarian cancer in the family.    GYNECOLOGIC HISTORY:  No LMP recorded. Patient is postmenopausal. Menarche age 85, first live birth age 55. The patient is GX P2. She stopped having periods in her early 35s. She did not take hormone replacement. She used oral contraceptives remotely for approximately 2 years, with no complications.   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 Her husband Ernie Hew was a Airline pilot. They currently live at [facility]. Daughter Jerene Dilling lives in [place], near [place]. With her husband Jenny Reichmann. Daughter Pam lives in [place]. The patient has 2 grandchildren and 4 great-grandchildren. She is a Psychologist, forensic.   ADVANCED DIRECTIVES: The patient has a living will in place and she has named her granddaughter Almetta Lovely as her healthcare part of attorney. Lattie Haw can be reached at [place]: Social History   Tobacco Use  . Smoking status: Never Smoker  . Smokeless tobacco: Never Used  Vaping Use  . Vaping Use: Never used  Substance Use Topics   . Alcohol use: No    Comment: 1 glass occ wine 08/03/14- none in a year  . Drug use: No     Colonoscopy: Laurence Spates, remote  PAP:  Bone density:   Allergies  Allergen Reactions  . Zosyn [Piperacillin Sod-Tazobactam So] Hives, Itching, Rash and Other (See Comments)    Morbilliform eruptions with itching- looks like Measles  . Atorvastatin     myalgias  . Atenolol Nausea And Vomiting  . Clarithromycin Nausea And Vomiting  . Codeine Sulfate  Nausea Only  . Levaquin [Levofloxacin] Nausea Only  . Macrodantin Other (See Comments)    UNSPECIFIED   . Oxycodone-Acetaminophen Nausea And Vomiting    Current Outpatient Medications  Medication Sig Dispense Refill  . acetaminophen (TYLENOL) 500 MG tablet Take 500 mg by mouth daily as needed for mild pain.     [name] anastrozole (ARIMIDEX) 1 MG tablet Take 1 tablet (1 mg total) by mouth daily. 90 tablet 3  . apixaban (ELIQUIS) 5 MG TABS tablet Take 1 tablet (5 mg total) by mouth 2 (two) times daily. 180 tablet 3  . B Complex-C (B-COMPLEX WITH VITAMIN C) tablet Take 1 tablet by mouth daily.    . busPIRone (BUSPAR) 15 MG tablet Take 1 tablet (15 mg total) by mouth 2 (two) times daily. 180 tablet 11  . Cholecalciferol (VITAMIN D3) 1000 UNITS tablet Take 1,000 Units by mouth daily.    [name] diltiazem (CARDIZEM CD) 120 MG 24 hr capsule TAKE 1 CAPSULE (120 MG TOTAL) BY MOUTH DAILY. 90 capsule 3  . doxycycline (VIBRA-TABS) 100 MG tablet Take 1 tablet (100 mg total) by mouth daily. 90 tablet 3  . famotidine (PEPCID) 20 MG tablet Take 20 mg by mouth daily.    [name] gabapentin (NEURONTIN) 100 MG capsule Take 1 capsule (100 mg total) by mouth at bedtime. Pain 90 capsule 3  . levothyroxine (SYNTHROID) 25 MCG tablet TAKE 1 TABLET BY MOUTH DAILY FOR THYROID. 30 tablet 11  . linaclotide (LINZESS) 290 MCG CAPS capsule Take 1 capsule (290 mcg total) by mouth daily. 30 capsule 11  . metoprolol succinate (TOPROL-XL) 50 MG 24 hr tablet Take 0.5 tablets (25 mg total) by  mouth 2 (two) times daily. Take with or immediately following a meal. 180 tablet 3  . Multiple Vitamins-Minerals (MULTIVITAMIN WITH MINERALS) tablet Take 1 tablet by mouth daily.    . polyethylene glycol (MIRALAX) 17 g packet Take 17 g by mouth daily. 30 each 0  . temazepam (RESTORIL) 15 MG capsule TAKE 1 CAPSULE BY MOUTH EVERY NIGHT AT BEDTIME AS NEEDED FOR SLEEP 60 capsule 3   No current facility-administered medications for this visit.    OBJECTIVE: White woman who appears stated age  Vitals:   06/04/20 1157  BP: 136/61  Pulse: 61  Resp: 18  Temp: 98.1 F (36.7 C)  SpO2: 95%     Body mass index is 28.08 kg/m.     ECOG FS:1 - Symptomatic but completely ambulatory  Sclerae unicteric, EOMs intact Wearing a mask No cervical or supraclavicular adenopathy Lungs no rales or rhonchi Heart regular rate and rhythm Abd soft, nontender, positive bowel sounds MSK no focal spinal tenderness, bandages in place over both shins Neuro: nonfocal, well oriented, appropriate affect Breasts: The right breast is benign.  The left breast is status post lumpectomy.  There is no evidence of recurrence.  Both axillae are benign   LAB RESULTS:  CMP     Component Value Date/Time   NA 138 02/27/2020 1430   NA 134 08/11/2018 1425   NA 137 07/28/2016 1334   K 3.8 02/27/2020 1430   K 3.8 07/28/2016 1334   CL 100 02/27/2020 1430   CO2 30 02/27/2020 1430   CO2 28 07/28/2016 1334   GLUCOSE 86 02/27/2020 1430   GLUCOSE 160 (H) 07/28/2016 1334   BUN 14 02/27/2020 1430   BUN 20 08/11/2018 1425   BUN 9.5 07/28/2016 1334   CREATININE 0.93 02/27/2020 1430   CREATININE 1.0 07/28/2016 1334  CALCIUM 9.8 02/27/2020 1430   CALCIUM 9.6 07/28/2016 1334   PROT 7.8 02/27/2020 1430   PROT 7.4 07/28/2016 1334   ALBUMIN 4.2 02/27/2020 1430   ALBUMIN 4.0 07/28/2016 1334   AST 22 02/27/2020 1430   AST 21 07/28/2016 1334   ALT 13 02/27/2020 1430   ALT 24 07/28/2016 1334   ALKPHOS 81 02/27/2020 1430    ALKPHOS 96 07/28/2016 1334   BILITOT 1.1 02/27/2020 1430   BILITOT 1.21 (H) 07/28/2016 1334   GFRNONAA 43 (L) 10/03/2019 1355   GFRAA 50 (L) 10/03/2019 1355    INo results found for: SPEP, UPEP  Lab Results  Component Value Date   WBC 5.7 06/04/2020   NEUTROABS 3.3 06/04/2020   HGB 12.1 06/04/2020   HCT 37.6 06/04/2020   MCV 106.5 (H) 06/04/2020   PLT 197 06/04/2020      Chemistry      Component Value Date/Time   NA 138 02/27/2020 1430   NA 134 08/11/2018 1425   NA 137 07/28/2016 1334   K 3.8 02/27/2020 1430   K 3.8 07/28/2016 1334   CL 100 02/27/2020 1430   CO2 30 02/27/2020 1430   CO2 28 07/28/2016 1334   BUN 14 02/27/2020 1430   BUN 20 08/11/2018 1425   BUN 9.5 07/28/2016 1334   CREATININE 0.93 02/27/2020 1430   CREATININE 1.0 07/28/2016 1334      Component Value Date/Time   CALCIUM 9.8 02/27/2020 1430   CALCIUM 9.6 07/28/2016 1334   ALKPHOS 81 02/27/2020 1430   ALKPHOS 96 07/28/2016 1334   AST 22 02/27/2020 1430   AST 21 07/28/2016 1334   ALT 13 02/27/2020 1430   ALT 24 07/28/2016 1334   BILITOT 1.1 02/27/2020 1430   BILITOT 1.21 (H) 07/28/2016 1334       No results found for: LABCA2  No components found for: LABCA125  No results for input(s): INR in the last 168 hours.  Urinalysis    Component Value Date/Time   COLORURINE YELLOW 10/07/2018 1535   APPEARANCEUR CLEAR 10/07/2018 1535   LABSPEC 1.012 10/07/2018 1535   PHURINE 6.0 10/07/2018 1535   GLUCOSEU NEGATIVE 10/07/2018 1535   GLUCOSEU NEGATIVE 01/17/2015 1415   HGBUR NEGATIVE 10/07/2018 [place] 10/07/2018 1535   KETONESUR NEGATIVE 10/07/2018 1535   PROTEINUR 30 (A) 10/07/2018 1535   UROBILINOGEN 0.2 01/17/2015 1415   NITRITE NEGATIVE 10/07/2018 1535   LEUKOCYTESUR NEGATIVE 10/07/2018 1535    STUDIES: No results found.   ELIGIBLE FOR AVAILABLE RESEARCH PROTOCOL: no  ASSESSMENT: 87 y.o. [place] woman status post left breast upper outer quadrant biopsy  06/11/2015 for a papillary lesion with unclear margins.  (1) status post left lumpectomy 07/25/2015 for an mpT2 cN0, stage IIA  invasive ductal carcinoma,  grade I-II  estrogen and progesterone receptor strongly positive, HER-2 not amplified   (a) additional surgery 09/05/2015 found some residual ductal carcinoma in situ but the margins were clear.  (2) no sentinel lymph node sampling or adjuvant radiation planned  (3) adjuvant anastrozole started 08/10/2015, completed May 2022  (4) squamous cell carcinomas of the skin in both lower extremities   PLAN:  Biridiana is now just about 5 years out from definitive surgery for her breast cancer with no evidence of recurrence.  This is very favorable.  She has done well with anastrozole.  She is completing 5years.  I am comfortable stopping that medication at this point.  We discussed that at length.  I was ready  to have her "graduate" from follow-up here but we have his new issue with these skin cancers.  I tried to get her an appointment with radiation oncology today but they were booked.  They are going to see her later this week for full consult.  Just in case I am leaving her appointment with me in September in place.  However if the squamous cell issue is taking care of successfully she may cancel that appointment  Total encounter time 25 minutes.*   Abrey Bradway, Virgie Dad, MD  06/04/20 12:12 PM Medical Oncology and Hematology [facility] [place], [place] [address] Tel. [phone]    Fax. [phone]   I, Wilburn Mylar, am acting as scribe for Dr. Virgie Dad. Manraj Yeo.  I, Lurline Del MD, have reviewed the above documentation for accuracy and completeness, and I agree with the above.   *Total Encounter Time as defined by the Centers for Medicare and Medicaid Services includes, in addition to the face-to-face time of a patient visit (documented in the note above) non-face-to-face time: obtaining and  reviewing outside history, ordering and reviewing medications, tests or procedures, care coordination (communications with other health care professionals or caregivers) and documentation in the medical record.

## 2020-06-04 ENCOUNTER — Inpatient Hospital Stay: Payer: Medicare Other | Attending: Oncology | Admitting: Oncology

## 2020-06-04 ENCOUNTER — Inpatient Hospital Stay: Payer: Medicare Other

## 2020-06-04 ENCOUNTER — Other Ambulatory Visit: Payer: Self-pay

## 2020-06-04 VITALS — BP 136/61 | HR 61 | Temp 98.1°F | Resp 18 | Ht 64.0 in | Wt 163.6 lb

## 2020-06-04 DIAGNOSIS — M899 Disorder of bone, unspecified: Secondary | ICD-10-CM

## 2020-06-04 DIAGNOSIS — M949 Disorder of cartilage, unspecified: Secondary | ICD-10-CM

## 2020-06-04 DIAGNOSIS — C50412 Malignant neoplasm of upper-outer quadrant of left female breast: Secondary | ICD-10-CM | POA: Diagnosis not present

## 2020-06-04 DIAGNOSIS — C50912 Malignant neoplasm of unspecified site of left female breast: Secondary | ICD-10-CM

## 2020-06-04 DIAGNOSIS — C44729 Squamous cell carcinoma of skin of left lower limb, including hip: Secondary | ICD-10-CM | POA: Diagnosis not present

## 2020-06-04 DIAGNOSIS — Z79811 Long term (current) use of aromatase inhibitors: Secondary | ICD-10-CM | POA: Diagnosis not present

## 2020-06-04 DIAGNOSIS — C4490 Unspecified malignant neoplasm of skin, unspecified: Secondary | ICD-10-CM | POA: Diagnosis not present

## 2020-06-04 DIAGNOSIS — C44722 Squamous cell carcinoma of skin of right lower limb, including hip: Secondary | ICD-10-CM | POA: Diagnosis not present

## 2020-06-04 DIAGNOSIS — Z170 Estrogen receptor positive status [ER+]: Secondary | ICD-10-CM

## 2020-06-04 LAB — COMPREHENSIVE METABOLIC PANEL
ALT: 10 U/L (ref 0–44)
AST: 20 U/L (ref 15–41)
Albumin: 3.6 g/dL (ref 3.5–5.0)
Alkaline Phosphatase: 76 U/L (ref 38–126)
Anion gap: 8 (ref 5–15)
BUN: 17 mg/dL (ref 8–23)
CO2: 30 mmol/L (ref 22–32)
Calcium: 9.6 mg/dL (ref 8.9–10.3)
Chloride: 102 mmol/L (ref 98–111)
Creatinine, Ser: 1.03 mg/dL — ABNORMAL HIGH (ref 0.44–1.00)
GFR, Estimated: 51 mL/min — ABNORMAL LOW (ref >60)
Glucose, Bld: 103 mg/dL — ABNORMAL HIGH (ref 70–99)
Potassium: 3.6 mmol/L (ref 3.5–5.1)
Sodium: 140 mmol/L (ref 135–145)
Total Bilirubin: 1 mg/dL (ref 0.3–1.2)
Total Protein: 7.2 g/dL (ref 6.5–8.1)

## 2020-06-04 LAB — CBC WITH DIFFERENTIAL/PLATELET
Abs Immature Granulocytes: 0.01 10*3/uL (ref 0.00–0.07)
Basophils Absolute: 0.1 10*3/uL (ref 0.0–0.1)
Basophils Relative: 1 %
Eosinophils Absolute: 0.2 10*3/uL (ref 0.0–0.5)
Eosinophils Relative: 3 %
HCT: 37.6 % (ref 36.0–46.0)
Hemoglobin: 12.1 g/dL (ref 12.0–15.0)
Immature Granulocytes: 0 %
Lymphocytes Relative: 26 %
Lymphs Abs: 1.5 10*3/uL (ref 0.7–4.0)
MCH: 34.3 pg — ABNORMAL HIGH (ref 26.0–34.0)
MCHC: 32.2 g/dL (ref 30.0–36.0)
MCV: 106.5 fL — ABNORMAL HIGH (ref 80.0–100.0)
Monocytes Absolute: 0.7 10*3/uL (ref 0.1–1.0)
Monocytes Relative: 12 %
Neutro Abs: 3.3 10*3/uL (ref 1.7–7.7)
Neutrophils Relative %: 58 %
Platelets: 197 10*3/uL (ref 150–400)
RBC: 3.53 MIL/uL — ABNORMAL LOW (ref 3.87–5.11)
RDW: 12.9 % (ref 11.5–15.5)
WBC: 5.7 10*3/uL (ref 4.0–10.5)
nRBC: 0 % (ref 0.0–0.2)

## 2020-06-05 ENCOUNTER — Telehealth: Payer: Self-pay

## 2020-06-05 NOTE — Progress Notes (Signed)
Histology and Location of Primary Skin Cancer:    Past/Anticipated interventions by patient's surgeon/dermatologist for current problematic lesion, if any:  04/20/2019 Dr. Jarome Matin   Past skin cancers, if any:   SAFETY ISSUES:  Prior radiation? No  Pacemaker/ICD? No  Possible current pregnancy? No--postmenopausal  Is the patient on methotrexate? No  Current Complaints / other details:  Patient has received the first 3 Pfizer vaccines.

## 2020-06-05 NOTE — Telephone Encounter (Signed)
Left voice message for Granddaughter Lattie Haw about why we are waiting to schedule her appointment. Cecille Rubin is attempting to get the photos from Dr Ronnald Ramp so Dr Isidore Moos can make a decision.

## 2020-06-05 NOTE — Telephone Encounter (Signed)
Spoke with patients daughter Bernette Redbird, Dr Isidore Moos has not received the pictures but wants to see the patient in person. Message left for Cecille Rubin to call the daughter with the appointment daughter states she will go and get the photos from Dr Ronnald Ramp office.

## 2020-06-06 ENCOUNTER — Telehealth: Payer: Medicare Other | Admitting: Licensed Clinical Social Worker

## 2020-06-06 ENCOUNTER — Ambulatory Visit
Admission: RE | Admit: 2020-06-06 | Discharge: 2020-06-06 | Disposition: A | Discharge status: Home / Self Care | Payer: Medicare Other | Source: Ambulatory Visit | Attending: Radiation Oncology | Admitting: Radiation Oncology

## 2020-06-06 ENCOUNTER — Other Ambulatory Visit: Payer: Self-pay

## 2020-06-06 VITALS — BP 121/67 | HR 63 | Temp 97.9°F | Resp 20 | Ht 65.0 in | Wt 164.0 lb

## 2020-06-06 DIAGNOSIS — C44729 Squamous cell carcinoma of skin of left lower limb, including hip: Secondary | ICD-10-CM | POA: Insufficient documentation

## 2020-06-06 DIAGNOSIS — C44722 Squamous cell carcinoma of skin of right lower limb, including hip: Secondary | ICD-10-CM | POA: Diagnosis not present

## 2020-06-06 DIAGNOSIS — F419 Anxiety disorder, unspecified: Secondary | ICD-10-CM | POA: Insufficient documentation

## 2020-06-06 DIAGNOSIS — I4892 Unspecified atrial flutter: Secondary | ICD-10-CM | POA: Diagnosis not present

## 2020-06-06 DIAGNOSIS — C44721 Squamous cell carcinoma of skin of unspecified lower limb, including hip: Secondary | ICD-10-CM

## 2020-06-06 DIAGNOSIS — I10 Essential (primary) hypertension: Secondary | ICD-10-CM | POA: Insufficient documentation

## 2020-06-06 DIAGNOSIS — D509 Iron deficiency anemia, unspecified: Secondary | ICD-10-CM | POA: Insufficient documentation

## 2020-06-06 DIAGNOSIS — E039 Hypothyroidism, unspecified: Secondary | ICD-10-CM | POA: Insufficient documentation

## 2020-06-06 DIAGNOSIS — Z853 Personal history of malignant neoplasm of breast: Secondary | ICD-10-CM | POA: Insufficient documentation

## 2020-06-06 DIAGNOSIS — K449 Diaphragmatic hernia without obstruction or gangrene: Secondary | ICD-10-CM | POA: Diagnosis not present

## 2020-06-06 DIAGNOSIS — K219 Gastro-esophageal reflux disease without esophagitis: Secondary | ICD-10-CM | POA: Diagnosis not present

## 2020-06-06 DIAGNOSIS — E785 Hyperlipidemia, unspecified: Secondary | ICD-10-CM | POA: Insufficient documentation

## 2020-06-06 DIAGNOSIS — Z79899 Other long term (current) drug therapy: Secondary | ICD-10-CM | POA: Insufficient documentation

## 2020-06-06 DIAGNOSIS — I5032 Chronic diastolic (congestive) heart failure: Secondary | ICD-10-CM | POA: Diagnosis not present

## 2020-06-06 DIAGNOSIS — M8580 Other specified disorders of bone density and structure, unspecified site: Secondary | ICD-10-CM | POA: Diagnosis not present

## 2020-06-06 DIAGNOSIS — I4510 Unspecified right bundle-branch block: Secondary | ICD-10-CM | POA: Diagnosis not present

## 2020-06-06 DIAGNOSIS — Z809 Family history of malignant neoplasm, unspecified: Secondary | ICD-10-CM | POA: Diagnosis not present

## 2020-06-06 NOTE — Telephone Encounter (Signed)
[place] Work  Clinical Social Work was referred by Dr. Isidore Moos for transportation assistance.  Clinical Social Worker attempted to contact patient and HCPOA Lattie Haw) by phone  to offer support and assess for needs.   No answer on either number. Left VM with direct contact information.     [place], [employer] Worker [employer]

## 2020-06-06 NOTE — Progress Notes (Signed)
Radiation Oncology         [phone] ________________________________  Initial Outpatient Consultation  Name: ROSALEE BANIS MRN: [id number]  Date: 06/06/2020  DOB: 1925/07/11  [id number], [name], MD  Danella Sensing, MD   REFERRING PHYSICIAN: Danella Sensing, MD  DIAGNOSIS:    ICD-10-CM   1. Squamous cell carcinoma of left lower leg  C44.729 Ambulatory referral to Social Work  2. Squamous cell carcinoma of right lower leg  C44.722 Ambulatory referral to Social Work  3. Squamous cell carcinoma of skin of lower extremity, unspecified laterality  C44.721     Cancer Staging Malignant neoplasm of upper-outer quadrant of left breast in female, estrogen receptor positive ([place]) Staging form: Breast, AJCC 7th Edition - Clinical: Stage IIA (T2, N0, M0) - Signed by Chauncey Cruel, MD on 08/10/2015  Skin cancer Staging form: Cutaneous Squamous Cell Carcinoma, AJCC 7th Edition - Clinical: No stage assigned - Unsigned At least stage T3N0M0 Skin cancer  CHIEF COMPLAINT: Here to discuss management of skin cancer  HISTORY OF PRESENT ILLNESS::Jaylenn A Hermsen is a 87 y.o. female  breast cancer survivor who presents with bilateral squamous cell carcinomas of the pretibial skin.  Her daughter reports that she underwent surgical treatment a year ago for a right pretibial skin cancer.  The area never healed.  She was given reassurance from some providers and concern from other providers.  She continued wound care with home health. Over time it became evident that there was recurrent cancer in that area.  Now she presents bilateral pretibial squamous cell carcinomas.   Recent biopsies as below from 04/19/2020, Dr. Ronnald Ramp, dermatologist:      Past skin cancers, if any:   SAFETY ISSUES:  Prior radiation? No  Pacemaker/ICD? No  Possible current pregnancy? No--postmenopausal  Is the patient on methotrexate? No  Current Complaints / other details:  Patient has received the first 3 Pfizer  vaccines.   She presents in a wheelchair.  She is with her daughter who lives in [place].  The patient also has family nearby.  She lives in a senior home.  PREVIOUS RADIATION THERAPY: No  PAST MEDICAL HISTORY:  has a past medical history of Anxiety, Atrial flutter (HCC), Bilateral edema of lower extremity, Breast cancer ([place]), Chronic constipation, Chronic diastolic CHF (congestive heart failure) ([place]), Diverticulosis of colon, GERD (gastroesophageal reflux disease), H/O hiatal hernia, History of cellulitis, History of colitis, History of diverticulitis of colon, History of GI bleed, History of Helicobacter pylori infection, History of ulcer of lower limb, Hyperlipidemia, Hypertension, Hypothyroidism, Iron deficiency anemia (2011), LBP (low back pain), Lumbar spondylolysis, OA (osteoarthritis), Open wound of left lower leg, Osteopenia, Persistent atrial fibrillation ([place]), RBBB, and Urinary frequency.    PAST SURGICAL HISTORY: Past Surgical History:  Procedure Laterality Date  . APPLICATION OF A-CELL OF EXTREMITY Left 06/21/2013   Procedure: APPLICATION OF A-CELL OF EXTREMITY;  Surgeon: Theodoro Kos, DO;  Location: [place];  Service: Plastics;  Laterality: Left;  . APPLICATION OF A-CELL OF EXTREMITY Left 08/22/2013   Procedure: APPLICATION OF A-CELL/VAC TO LOWER LEFT LEG WOUND;  Surgeon: Theodoro Kos, DO;  Location: [place];  Service: Plastics;  Laterality: Left;  . APPLICATION OF A-CELL OF EXTREMITY Left 02/06/2014   Procedure: WITH PLACEMENT OF A -CELL ;  Surgeon: Theodoro Kos, DO;  Location: [place];  Service: Plastics;  Laterality: Left;  . APPLICATION OF A-CELL OF EXTREMITY Left 08/09/2014   Procedure: PLACEMENT OF A CELL;  Surgeon: Theodoro Kos,  DO;  Location: [place];  Service: Plastics;  Laterality: Left;  . APPLICATION OF WOUND VAC Left 06/16/2013   Procedure: APPLICATION OF WOUND VAC;  Surgeon: Meredith Pel, MD;  Location: [place];  Service:  Orthopedics;  Laterality: Left;  . APPLICATION OF WOUND VAC Left 06/21/2013   Procedure: APPLICATION OF WOUND VAC;  Surgeon: Theodoro Kos, DO;  Location: [place];  Service: Plastics;  Laterality: Left;  . BIOPSY BREAST  07/25/2015   LEFT RADIOACTIVE SEED GUIDED EXCISIONAL BREAST BIOPSY (Left) as a surgical intervention  . EXTERNAL FIXATION LEG Left 06/16/2013   Procedure: EXTERNAL FIXATION LEG;  Surgeon: Meredith Pel, MD;  Location[place];  Service: Orthopedics;  Laterality: Left;  . EXTERNAL FIXATION REMOVAL Left 06/21/2013   Procedure: REMOVAL EXTERNAL FIXATION LEG;  Surgeon: Meredith Pel, MD;  Location: [name];  Service: Orthopedics;  Laterality: Left;  . EYE SURGERY Bilateral    cataract removal  . HARDWARE REMOVAL Left 05/23/2014   Procedure: HARDWARE REMOVAL;  Surgeon: Meredith Pel, MD;  Location: [place];  Service: Orthopedics;  Laterality: Left;  REMOVAL OF HARDWARE LEFT TIBIA  . I & D EXTREMITY Left 06/16/2013   Procedure: IRRIGATION AND DEBRIDEMENT EXTREMITY;  Surgeon: Meredith Pel, MD;  Location: [place];  Service: Orthopedics;  Laterality: Left;  . I & D EXTREMITY Left 02/06/2014   Procedure: IRRIGATION AND DEBRIDEMENT LEFT LEG WOUND ;  Surgeon: Theodoro Kos, DO;  Location: [place];  Service: Plastics;  Laterality: Left;  . I & D EXTREMITY Left 08/09/2014   Procedure: IRRIGATION AND DEBRIDEMENT  OF LEFT LOWER LEG;  Surgeon: Theodoro Kos, DO;  Location: [place];  Service: Plastics;  Laterality: Left;  . LUMBAR FUSION  01-02-2009   L3-L5  . RADIOACTIVE SEED GUIDED EXCISIONAL BREAST BIOPSY Left 07/25/2015   Procedure: LEFT RADIOACTIVE SEED GUIDED EXCISIONAL BREAST BIOPSY;  Surgeon: Rolm Bookbinder, MD;  Location: [place];  Service: General;  Laterality: Left;  . RE-EXCISION OF BREAST LUMPECTOMY Left 09/05/2015   Procedure: RE-EXCISION OF LEFT BREAST LUMPECTOMY;  Surgeon: Rolm Bookbinder, MD;  Location: [place];  Service: General;  Laterality: Left;  .  SHOULDER OPEN ROTATOR CUFF REPAIR Left 1997  . TIBIA IM NAIL INSERTION Left 06/21/2013   Procedure: INTRAMEDULLARY (IM) NAIL TIBIAL;  Surgeon: Meredith Pel, MD;  Location: [place];  Service: Orthopedics;  Laterality: Left;  . TONSILLECTOMY  AS CHILD  . TOTAL HIP ARTHROPLASTY Right 06-07-2010  . TRANSTHORACIC ECHOCARDIOGRAM  09-11-2010   DR MCALHANY   LVSF  55-60%/  MILD MV CALCIFICATION WITH NO STENOSIS/  MILD MR & TR / MODERATE LAE/  MODERATE TO SEVERE RAE  . UPPER GI ENDOSCOPY  03-29-2010    FAMILY HISTORY: family history includes Cancer in her father; Hypertension in an other family member.  SOCIAL HISTORY:  reports that she has never smoked. She has never used smokeless tobacco. She reports that she does not drink alcohol and does not use drugs.  ALLERGIES: Zosyn [piperacillin sod-tazobactam so], Atorvastatin, Atenolol, Clarithromycin, Codeine sulfate, Levaquin [levofloxacin], Macrodantin, and Oxycodone-acetaminophen  MEDICATIONS:  Current Outpatient Medications  Medication Sig Dispense Refill  . acetaminophen (TYLENOL) 500 MG tablet Take 500 mg by mouth daily as needed for mild pain.     [name] apixaban (ELIQUIS) 5 MG TABS tablet Take 1 tablet (5 mg total) by mouth 2 (two) times daily. 180 tablet 3  . B Complex-C (B-COMPLEX WITH VITAMIN C) tablet Take 1 tablet by mouth daily.    [name]  busPIRone (BUSPAR) 15 MG tablet Take 1 tablet (15 mg total) by mouth 2 (two) times daily. 180 tablet 11  . Cholecalciferol (VITAMIN D3) 1000 UNITS tablet Take 1,000 Units by mouth daily.    [name] diltiazem (CARDIZEM CD) 120 MG 24 hr capsule TAKE 1 CAPSULE (120 MG TOTAL) BY MOUTH DAILY. 90 capsule 3  . doxycycline (VIBRA-TABS) 100 MG tablet Take 1 tablet (100 mg total) by mouth daily. 90 tablet 3  . famotidine (PEPCID) 20 MG tablet Take 20 mg by mouth daily.    [name] gabapentin (NEURONTIN) 100 MG capsule Take 1 capsule (100 mg total) by mouth at bedtime. Pain 90 capsule 3  . levothyroxine (SYNTHROID) 25 MCG tablet TAKE  1 TABLET BY MOUTH DAILY FOR THYROID. 30 tablet 11  . linaclotide (LINZESS) 290 MCG CAPS capsule Take 1 capsule (290 mcg total) by mouth daily. 30 capsule 11  . metoprolol succinate (TOPROL-XL) 50 MG 24 hr tablet Take 0.5 tablets (25 mg total) by mouth 2 (two) times daily. Take with or immediately following a meal. 180 tablet 3  . Multiple Vitamins-Minerals (MULTIVITAMIN WITH MINERALS) tablet Take 1 tablet by mouth daily.    . polyethylene glycol (MIRALAX) 17 g packet Take 17 g by mouth daily. 30 each 0  . temazepam (RESTORIL) 15 MG capsule TAKE 1 CAPSULE BY MOUTH EVERY NIGHT AT BEDTIME AS NEEDED FOR SLEEP 60 capsule 3   No current facility-administered medications for this encounter.    REVIEW OF SYSTEMS:  Notable for that above.   PHYSICAL EXAM:  height is 5\' 5"  (1.651 m) and weight is 164 lb (74.4 kg). Her temperature is 97.9 F (36.6 C). Her blood pressure is 121/67 and her pulse is 63. Her respiration is 20 and oxygen saturation is 97%.   General: Alert and conversant, in no acute distress, sitting in wheelchair Extremities: She has lower extremity pitting edema.  Evidence of poor vascular supply in her legs bilaterally.  The skin is erythematous and scaly.  Pretibial skin reveals moist weepage bilaterally.  There is an odor from the white/yellowish fluid. The cancer is more nodular and advanced on the right side.  Please see photos below  lymphatics: No obvious palpable popliteal adenopathy Skin: See extremity exam Psychiatric: She repeats herself often and relies on her daughter for some of the details of the history. Affect is appropriate.  LEFT leg:    RIGHT leg:   ECOG = 3  0 - Asymptomatic (Fully active, able to carry on all predisease activities without restriction)  1 - Symptomatic but completely ambulatory (Restricted in physically strenuous activity but ambulatory and able to carry out work of a light or sedentary nature. For example, light housework, office work)  2 -  Symptomatic, <50% in bed during the day (Ambulatory and capable of all self care but unable to carry out any work activities. Up and about more than 50% of waking hours)  3 - Symptomatic, >50% in bed, but not bedbound (Capable of only limited self-care, confined to bed or chair 50% or more of waking hours)  4 - Bedbound (Completely disabled. Cannot carry on any self-care. Totally confined to bed or chair)  5 - Death   Santiago Glad MM, Creech RH, Tormey DC, et al. [phone]). "Toxicity and response criteria of the [facility]". Am. Evlyn Clines. Oncol. 5 (6): 649-55   LABORATORY DATA:  Lab Results  Component Value Date   WBC 5.7 06/04/2020   HGB 12.1 06/04/2020   HCT 37.6  06/04/2020   MCV 106.5 (H) 06/04/2020   PLT 197 06/04/2020   CMP     Component Value Date/Time   NA 140 06/04/2020 1138   NA 134 08/11/2018 1425   NA 137 07/28/2016 1334   K 3.6 06/04/2020 1138   K 3.8 07/28/2016 1334   CL 102 06/04/2020 1138   CO2 30 06/04/2020 1138   CO2 28 07/28/2016 1334   GLUCOSE 103 (H) 06/04/2020 1138   GLUCOSE 160 (H) 07/28/2016 1334   BUN 17 06/04/2020 1138   BUN 20 08/11/2018 1425   BUN 9.5 07/28/2016 1334   CREATININE 1.03 (H) 06/04/2020 1138   CREATININE 1.0 07/28/2016 1334   CALCIUM 9.6 06/04/2020 1138   CALCIUM 9.6 07/28/2016 1334   PROT 7.2 06/04/2020 1138   PROT 7.4 07/28/2016 1334   ALBUMIN 3.6 06/04/2020 1138   ALBUMIN 4.0 07/28/2016 1334   AST 20 06/04/2020 1138   AST 21 07/28/2016 1334   ALT 10 06/04/2020 1138   ALT 24 07/28/2016 1334   ALKPHOS 76 06/04/2020 1138   ALKPHOS 96 07/28/2016 1334   BILITOT 1.0 06/04/2020 1138   BILITOT 1.21 (H) 07/28/2016 1334   GFRNONAA 51 (L) 06/04/2020 1138   GFRAA 50 (L) 10/03/2019 1355        RADIOGRAPHY: No results found.    IMPRESSION/PLAN: Skin cancer of bilateral pretibial regions  Today, I talked to the patient and her daughter about the findings and work-up thus far.   Unfortunately she has very advanced  cancer of the bilateral lower extremities.  She has suboptimal vascular supply to her lower extremities and I explained to them that this very difficult cancer to cure because curative treatments can cause nonhealing wounds given the location of her cancer and her performance status/vascular status/pre-existing conditions.  Her dermatologist did not recommend surgery.  An alternative palliative treatment option would be radiation if we are going to fight the cancer.  Even palliative treatment could cause the cancer to regress briskly and leave a severe nonhealing wound.  Bone exposure is possible.  Patient could also develop a worsening infection, osteomyelitis, open wound exposing bone, and potential death.  These risks are present whether or not she goes through treatment, but treatment could increase some of these risks.  We also discussed the possibility of skin irritation, pain, and fatigue related to treatment.  That said, a modified course of radiation therapy with breaks is an option and reasonable to try given how advanced her wounds already are.  The goal would be to shrink down the cancer, reduce weeping, control its growth for a period of time.  We talked about a regimen in which she could get 20 Gray in 10 treatments and then take a 2-week break and potentially repeat that 1 more time.  First, however, I recommend that she used to be seen at the wound care clinic to optimize the treatment of her cancer related wounds.  They may recommend putting her on a regimen of antibiotics as well.  I will see her back in about 4 weeks (after aggressive wound care ) with a tentative treatment planning session in case at that time she and her daughter would like to proceed with attempting radiation treatment.   The patient's daughter was quite tearful and emotional support was given.  This is such a terrible disease, and serious condition, and her daughter seems to have good insight.  The patient and her  daughter are pleased with the plan above and they  know to call me if they have any questions in the future.  Urgent referral to wound care clinic has been made.  On date of service, in total, I spent 60 minutes on this encounter. Patient was seen in person.   __________________________________________   Eppie Gibson, MD  This document serves as a record of services personally performed by Eppie Gibson, MD. It was created on his behalf by Clerance Lav, a trained medical scribe. The creation of this record is based on the scribe's personal observations and the provider's statements to them. This document has been checked and approved by the attending provider.

## 2020-06-08 ENCOUNTER — Telehealth: Payer: Self-pay | Admitting: *Deleted

## 2020-06-08 ENCOUNTER — Encounter: Payer: Self-pay | Admitting: Radiation Oncology

## 2020-06-08 DIAGNOSIS — C44721 Squamous cell carcinoma of skin of unspecified lower limb, including hip: Secondary | ICD-10-CM | POA: Insufficient documentation

## 2020-06-08 NOTE — Telephone Encounter (Signed)
CALLED PATIENT TO INFORM OF APPT. WITH THE WOUND CLINIC, APPT. HAS BEEN MOVED TO 06-11-20 DUE TO THE WOUND CLINIC HAVING A CANCELLATION, SPOKE WITH PATIENT'S DAUGHTER PAM BURNETTE AND SHE IS AWARE OF THIS APPT.

## 2020-06-11 ENCOUNTER — Other Ambulatory Visit: Payer: Self-pay

## 2020-06-11 ENCOUNTER — Other Ambulatory Visit: Payer: Self-pay | Admitting: Oncology

## 2020-06-11 ENCOUNTER — Encounter (HOSPITAL_BASED_OUTPATIENT_CLINIC_OR_DEPARTMENT_OTHER): Payer: Medicare Other | Attending: Internal Medicine | Admitting: Internal Medicine

## 2020-06-11 DIAGNOSIS — C44729 Squamous cell carcinoma of skin of left lower limb, including hip: Secondary | ICD-10-CM | POA: Diagnosis not present

## 2020-06-11 DIAGNOSIS — Z888 Allergy status to other drugs, medicaments and biological substances status: Secondary | ICD-10-CM | POA: Diagnosis not present

## 2020-06-11 DIAGNOSIS — L97829 Non-pressure chronic ulcer of other part of left lower leg with unspecified severity: Secondary | ICD-10-CM | POA: Diagnosis not present

## 2020-06-11 DIAGNOSIS — L97819 Non-pressure chronic ulcer of other part of right lower leg with unspecified severity: Secondary | ICD-10-CM | POA: Diagnosis not present

## 2020-06-11 DIAGNOSIS — C4492 Squamous cell carcinoma of skin, unspecified: Secondary | ICD-10-CM | POA: Diagnosis not present

## 2020-06-11 DIAGNOSIS — I872 Venous insufficiency (chronic) (peripheral): Secondary | ICD-10-CM | POA: Insufficient documentation

## 2020-06-11 DIAGNOSIS — Z881 Allergy status to other antibiotic agents status: Secondary | ICD-10-CM | POA: Insufficient documentation

## 2020-06-11 DIAGNOSIS — Z885 Allergy status to narcotic agent status: Secondary | ICD-10-CM | POA: Diagnosis not present

## 2020-06-11 DIAGNOSIS — C44722 Squamous cell carcinoma of skin of right lower limb, including hip: Secondary | ICD-10-CM | POA: Diagnosis not present

## 2020-06-11 NOTE — Progress Notes (Signed)
SHANQUILLA CADMAN ([id number]) , Visit Report for 06/11/2020 Allergy List Details Patient Name: Date of Service: [name], [place] 06/11/2020 1:15 PM Medical Record Number: [id number] Patient Account Number: [id number] Date of Birth/Sex: Treating RN: 1925/10/27 (87 y.o. [name], [name] Primary Care Andrw Mcguirt: [name] Other Clinician: Referring Tuere Nwosu: Treating Geanie Pacifico/Extender: Ludger Nutting Weeks in Treatment: 0 Allergies Active Allergies Zosyn atorvastatin atenolol clarithromycin codeine Levaquin Macrodantin oxycodone Allergy Notes Electronic Signature(s) Signed: 06/11/2020 5:30:27 PM By: Rhae Hammock RN Entered By: Rhae Hammock on 06/11/2020 13:40:12 -------------------------------------------------------------------------------- Arrival Information Details Patient Name: Date of Service: [name], [place]. 06/11/2020 1:15 PM Medical Record Number: [id number] Patient Account Number: [id number] Date of Birth/Sex: Treating RN: 06-02-1925 (87 y.o. [name], [name] Primary Care Lizandro Spellman: [name] Other Clinician: Referring Marvena Tally: Treating Aristotle Lieb/Extender: [name] in Treatment: 0 Visit Information Patient Arrived: Wheel Chair Arrival Time: 13:32 Accompanied By: daughter Transfer Assistance: None Patient Identification Verified: Yes Secondary Verification Process Completed: Yes Patient Requires Transmission-Based Precautions: No Patient Has Alerts: Yes Patient Alerts: R ABI = Non compressible L ABI = Non compressible Electronic Signature(s) Signed: 06/11/2020 5:27:53 PM By: Levan Hurst RN, BSN Entered By: Tedra Coupe History Since Last Visit Added or deleted any medications: No Any new allergies or adverse reactions: No Had a fall or experienced change in activities of daily living that may affect risk of falls: No Signs or symptoms of abuse/neglect since last  visito No Hospitalized since last visit: No Implantable device outside of the clinic excluding cellular tissue based products placed in the center since last visit: No Pain Present Now: No -------------------------------------------------------------------------------- Clinic Level of Care Assessment Details Patient Name: Date of Service: [name], Charish A. 06/11/2020 1:15 PM Medical Record Number: [id number] Patient Account Number: [id number] Date of Birth/Sex: Treating RN: 1925-04-15 (87 y.o. [name] Primary Care Tylee Newby: [name] Other Clinician: Referring Ashanti Littles: Treating Ordell Prichett/Extender: [name] in Treatment: 0 Clinic Level of Care Assessment Items TOOL 4 Quantity Score X- 1 0 Use when only an EandM is performed on FOLLOW-UP visit ASSESSMENTS - Nursing Assessment / Reassessment X- 1 10 Reassessment of Co-morbidities (includes updates in patient status) X- 1 5 Reassessment of Adherence to Treatment Plan ASSESSMENTS - Wound and Skin A ssessment / Reassessment []  - 0 Simple Wound Assessment / Reassessment - one wound X- 2 5 Complex Wound Assessment / Reassessment - multiple wounds []  - 0 Dermatologic / Skin Assessment (not related to wound area) ASSESSMENTS - Focused Assessment []  - 0 Circumferential Edema Measurements - multi extremities []  - 0 Nutritional Assessment / Counseling / Intervention X- 1 5 Lower Extremity Assessment (monofilament, tuning fork, pulses) []  - 0 Peripheral Arterial Disease Assessment (using hand held doppler) ASSESSMENTS - Ostomy and/or Continence Assessment and Care []  - 0 Incontinence Assessment and Management []  - 0 Ostomy Care Assessment and Management (repouching, etc.) PROCESS - Coordination of Care X - Simple Patient / Family Education for ongoing care 1 15 []  - 0 Complex (extensive) Patient / Family Education for ongoing care X- 1 10 Staff obtains Programmer, systems, Records, T  Results / Process Orders est []  - 0 Staff telephones HHA, Nursing Homes / Clarify orders / etc []  - 0 Routine Transfer to another Facility (non-emergent condition) []  - 0 Routine Hospital Admission (non-emergent condition) X- 1 15 New Admissions / Biomedical engineer / Ordering NPWT Apligraf, etc. , []  - 0 Emergency Hospital Admission (emergent condition) X- 1  10 Simple Discharge Coordination []  - 0 Complex (extensive) Discharge Coordination PROCESS - Special Needs []  - 0 Pediatric / Minor Patient Management []  - 0 Isolation Patient Management []  - 0 Hearing / Language / Visual special needs []  - 0 Assessment of Community assistance (transportation, D/C planning, etc.) []  - 0 Additional assistance / Altered mentation []  - 0 Support Surface(s) Assessment (bed, cushion, seat, etc.) INTERVENTIONS - Wound Cleansing / Measurement []  - 0 Simple Wound Cleansing - one wound X- 2 5 Complex Wound Cleansing - multiple wounds X- 1 5 Wound Imaging (photographs - any number of wounds) []  - 0 Wound Tracing (instead of photographs) []  - 0 Simple Wound Measurement - one wound X- 2 5 Complex Wound Measurement - multiple wounds INTERVENTIONS - Wound Dressings []  - 0 Small Wound Dressing one or multiple wounds []  - 0 Medium Wound Dressing one or multiple wounds X- 2 20 Large Wound Dressing one or multiple wounds []  - 0 Application of Medications - topical []  - 0 Application of Medications - injection INTERVENTIONS - Miscellaneous []  - 0 External ear exam []  - 0 Specimen Collection (cultures, biopsies, blood, body fluids, etc.) []  - 0 Specimen(s) / Culture(s) sent or taken to Lab for analysis []  - 0 Patient Transfer (multiple staff / Civil Service fast streamer / Similar devices) []  - 0 Simple Staple / Suture removal (25 or less) []  - 0 Complex Staple / Suture removal (26 or more) []  - 0 Hypo / Hyperglycemic Management (close monitor of Blood Glucose) []  - 0 Ankle / Brachial  Index (ABI) - do not check if billed separately X- 1 5 Vital Signs Has the patient been seen at the hospital within the last three years: Yes Total Score: 150 Level Of Care: New/Established - Level 4 Electronic Signature(s) Signed: 06/11/2020 5:27:53 PM By: Levan Hurst RN, BSN Entered By: Levan Hurst on 06/11/2020 15:36:28 -------------------------------------------------------------------------------- Encounter Discharge Information Details Patient Name: Date of Service: [name], [place] A. 06/11/2020 1:15 PM Medical Record Number: [id number] Patient Account Number: [id number] Date of Birth/Sex: Treating RN: 05-Jul-1925 (87 y.o. [name] Primary Care Limmie Schoenberg: [name] Other Clinician: Referring Frederick Klinger: Treating Goldy Calandra/Extender: [name] in Treatment: 0 Encounter Discharge Information Items Post Procedure Vitals Discharge Condition: Stable Temperature (F): 98.4 Ambulatory Status: Wheelchair Pulse (bpm): 70 Discharge Destination: Home Respiratory Rate (breaths/min): 17 Transportation: Private Auto Blood Pressure (mmHg): 130/90 Accompanied By: Daughter Schedule Follow-up Appointment: Yes Clinical Summary of Care: Provided on 06/11/2020 Form Type Recipient Paper Patient Patient Electronic Signature(s) Signed: 06/11/2020 4:39:29 PM By: Lorrin Jackson Previous Signature: 06/11/2020 4:38:46 PM Version By: Lorrin Jackson Entered By: Lorrin Jackson on 06/11/2020 16:39:29 -------------------------------------------------------------------------------- Lower Extremity Assessment Details Patient Name: Date of Service: [name], [place]. 06/11/2020 1:15 PM Medical Record Number: [id number] Patient Account Number: [id number] Date of Birth/Sex: Treating RN: [birth year] (87 y.o. [name], [name] Primary Care Season Astacio: [name] Other Clinician: Referring Nikkole Placzek: Treating Guerline Happ/Extender: [name] in Treatment: 0 Edema Assessment Assessed: [Left: Yes] [Right: Yes] Edema: [Left: Yes] [Right: Yes] Calf Left: Right: Point of Measurement: From Medial Instep 45 cm 46 cm Ankle Left: Right: Point of Measurement: From Medial Instep 23 cm 23 cm Knee To Floor Left: Right: From Medial Instep 43 cm 43 cm Vascular Assessment Pulses: Dorsalis Pedis Palpable: [Left:Yes] [Right:Yes] Posterior Tibial Palpable: [Left:Yes] [Right:Yes] Electronic Signature(s) Signed: 06/11/2020 5:27:53 PM By: Levan Hurst RN, BSN Signed: 06/11/2020 5:30:27 PM By: Rhae Hammock RN Entered By: Donnal Debar  Shatara on 06/11/2020 15:17:43 -------------------------------------------------------------------------------- Multi Wound Chart Details Patient Name: Date of Service: [name], [place] 06/11/2020 1:15 PM Medical Record Number: [id number] Patient Account Number: [id number] Date of Birth/Sex: Treating RN: [birth year] (87 y.o. [name] Primary Care Lenee Franze: [name] Other Clinician: Referring Alaa Mullally: Treating Ector Laurel/Extender: [name] in Treatment: 0 Vital Signs Height(in): 67 Pulse(bpm): 62 Weight(lbs): 163 Blood Pressure(mmHg): 130/90 Body Mass Index(BMI): 27 Temperature(F): 98.4 Respiratory Rate(breaths/min): 17 Photos: [N/A:N/A] Right, Anterior Lower Leg Left, Anterior Lower Leg N/A Wound Location: Blister Blister N/A Wounding Event: Malignant Wound Malignant Wound N/A Primary Etiology: Anemia, Arrhythmia, Congestive Heart Anemia, Arrhythmia, Congestive Heart N/A Comorbid History: Failure, Hypertension, Colitis, Failure, Hypertension, Colitis, Osteoarthritis Osteoarthritis 04/20/2020 04/20/2020 N/A Date Acquired: 0 0 N/A Weeks of Treatment: Open Open N/A Wound Status: 3.5x2.3x0.3 2.3x1.7x0.1 N/A Measurements L x W x D (cm) 6.322 3.071 N/A A (cm) : rea 1.897 0.307 N/A Volume (cm)  : 0.00% 0.00% N/A % Reduction in Area: 0.00% 0.00% N/A % Reduction in Volume: Full Thickness Without Exposed Full Thickness Without Exposed N/A Classification: Support Structures Support Structures Large Large N/A Exudate A mount: Serosanguineous Serosanguineous N/A Exudate Type: red, brown red, brown N/A Exudate Color: Yes Yes N/A Foul Odor A Cleansing: fter No No N/A Odor Anticipated Due to Product Use: Distinct, outline attached Distinct, outline attached N/A Wound Margin: Medium (34-66%) None Present (0%) N/A Granulation A mount: Red, Pink N/A N/A Granulation Quality: Medium (34-66%) Large (67-100%) N/A Necrotic Amount: Adherent Slough Eschar, Adherent Slough N/A Necrotic Tissue: Fascia: No Fascia: No N/A Exposed Structures: Fat Layer (Subcutaneous Tissue): No Fat Layer (Subcutaneous Tissue): No Tendon: No Tendon: No Muscle: No Muscle: No Joint: No Joint: No Bone: No Bone: No None None N/A Epithelialization: Chemical/Enzymatic/Mechanical Chemical/Enzymatic/Mechanical N/A Debridement: N/A N/A N/A Instrument: None None N/A Bleeding: Debridement Treatment Response: Procedure was tolerated well Procedure was tolerated well N/A Post Debridement Measurements L x 3.5x2.3x0.3 2.3x1.7x0.1 N/A W x D (cm) 1.897 0.307 N/A Post Debridement Volume: (cm) Debridement Debridement N/A Procedures Performed: Treatment Notes Wound #6 (Lower Leg) Wound Laterality: Right, Anterior Cleanser Soap and Water Discharge Instruction: May shower and wash wound with dial antibacterial soap and water prior to dressing change. Peri-Wound Care Triamcinolone 15 (g) Discharge Instruction: Use triamcinolone 15 (g) as directed Sween Lotion (Moisturizing lotion) Discharge Instruction: Apply moisturizing lotion as directed Topical Primary Dressing KerraCel Ag Gelling Fiber Dressing, 2x2 in (silver alginate) Discharge Instruction: Apply silver alginate to wound bed as  instructed Secondary Dressing Woven Gauze Sponge, Non-Sterile 4x4 in Discharge Instruction: Apply over primary dressing as directed. ABD Pad, 8x10 Discharge Instruction: Apply over primary dressing as directed. Secured With Compression Wrap Kerlix Roll 4.5x3.1 (in/yd) Discharge Instruction: Apply Kerlix and Coban compression as directed. Coban Self-Adherent Wrap 4x5 (in/yd) Discharge Instruction: Apply over Kerlix as directed. Compression Stockings Add-Ons Wound #7 (Lower Leg) Wound Laterality: Left, Anterior Cleanser Soap and Water Discharge Instruction: May shower and wash wound with dial antibacterial soap and water prior to dressing change. Peri-Wound Care Triamcinolone 15 (g) Discharge Instruction: Use triamcinolone 15 (g) as directed Sween Lotion (Moisturizing lotion) Discharge Instruction: Apply moisturizing lotion as directed Topical Primary Dressing KerraCel Ag Gelling Fiber Dressing, 2x2 in (silver alginate) Discharge Instruction: Apply silver alginate to wound bed as instructed Secondary Dressing Woven Gauze Sponge, Non-Sterile 4x4 in Discharge Instruction: Apply over primary dressing as directed. ABD Pad, 8x10 Discharge Instruction: Apply over primary dressing as directed. Secured With Compression Wrap Kerlix Roll 4.5x3.1 (in/yd) Discharge  Instruction: Apply Kerlix and Coban compression as directed. Coban Self-Adherent Wrap 4x5 (in/yd) Discharge Instruction: Apply over Kerlix as directed. Compression Stockings Add-Ons Electronic Signature(s) Signed: 06/11/2020 5:27:53 PM By: Levan Hurst RN, BSN Signed: 06/11/2020 5:32:23 PM By: Kalman Shan DO Entered By: Kalman Shan on 06/11/2020 17:13:12 -------------------------------------------------------------------------------- Multi-Disciplinary Care Plan Details Patient Name: Date of Service: [name], [place] 06/11/2020 1:15 PM Medical Record Number: [id number] Patient Account Number: [id number] Date of  Birth/Sex: Treating RN: Dec 11, 1925 (87 y.o. [name] Primary Care Jabier Deese: [name] Other Clinician: Referring Kambryn Dapolito: Treating Rupa Lagan/Extender: [name] in Treatment: 0 Multidisciplinary Care Plan reviewed with physician Active Inactive Malignancy/Atypical Etiology Nursing Diagnoses: Knowledge deficit related to disease process and management of malignancy Goals: Patient/caregiver will verbalize understanding of disease process and disease management of atypical ulcer etiology Date Initiated: 06/11/2020 Target Resolution Date: 07/06/2020 Goal Status: Active Patient/caregiver will verbalize understanding of disease process and disease management of malignancy Date Initiated: 06/11/2020 Target Resolution Date: 07/06/2020 Goal Status: Active Interventions: Assess patient and family medical history for signs and symptoms of malignancy/atypical etiology upon admission Provide education on atypical ulcer etiologies Provide education on malignant ulcerations Notes: Wound/Skin Impairment Nursing Diagnoses: Impaired tissue integrity Knowledge deficit related to ulceration/compromised skin integrity Goals: Patient/caregiver will verbalize understanding of skin care regimen Date Initiated: 06/11/2020 Target Resolution Date: 07/06/2020 Goal Status: Active Interventions: Assess patient/caregiver ability to obtain necessary supplies Assess patient/caregiver ability to perform ulcer/skin care regimen upon admission and as needed Assess ulceration(s) every visit Provide education on ulcer and skin care Notes: Electronic Signature(s) Signed: 06/11/2020 5:27:53 PM By: Levan Hurst RN, BSN Entered By: Levan Hurst on 06/11/2020 15:01:48 -------------------------------------------------------------------------------- Pain Assessment Details Patient Name: Date of Service: [name], Braelynn A. 06/11/2020 1:15 PM Medical Record Number:  [id number] Patient Account Number: [id number] Date of Birth/Sex: Treating RN: 27-Jul-1925 (87 y.o. [name], [name] Primary Care Brizeyda Holtmeyer: [name] Other Clinician: Referring Inanna Telford: Treating Tericka Devincenzi/Extender: [name] in Treatment: 0 Active Problems Location of Pain Severity and Description of Pain Patient Has Paino No Site Locations Rate the pain. Current Pain Level: 0 Pain Management and Medication Current Pain Management: Electronic Signature(s) Signed: 06/11/2020 5:30:27 PM By: Rhae Hammock RN Entered By: Rhae Hammock on 06/11/2020 13:53:59 -------------------------------------------------------------------------------- Patient/Caregiver Education Details Patient Name: Date of Service: [name], Elener A. 5/23/2022andnbsp1:15 PM Medical Record Number: [id number] Patient Account Number: [id number] Date of Birth/Gender: Treating RN: 1925/05/14 (87 y.o. [name] Primary Care Physician: [name] Other Clinician: Referring Physician: Treating Physician/Extender: [name] in Treatment: 0 Education Assessment Education Provided To: Patient Education Topics Provided Malignant/Atypical Wounds: Methods: Explain/Verbal Responses: State content correctly Wound/Skin Impairment: Methods: Explain/Verbal Responses: State content correctly Electronic Signature(s) Signed: 06/11/2020 5:27:53 PM By: Levan Hurst RN, BSN Entered By: Levan Hurst on 06/11/2020 15:02:03 -------------------------------------------------------------------------------- Wound Assessment Details Patient Name: Date of Service: [name], [place] A. 06/11/2020 1:15 PM Medical Record Number: [id number] Patient Account Number: [id number] Date of Birth/Sex: Treating RN: [birth year] (87 y.o. [name], [name] Primary Care Coda Mathey: [name] Other Clinician: Referring  Wilburta Milbourn: Treating Malone Vanblarcom/Extender: [name] in Treatment: 0 Wound Status Wound Number: 6 Primary Malignant Wound Etiology: Wound Location: Right, Anterior Lower Leg Wound Open Wounding Event: Blister Status: Date Acquired: 04/20/2020 Comorbid Anemia, Arrhythmia, Congestive Heart Failure, Hypertension, Weeks Of Treatment: 0 History: Colitis, Osteoarthritis Clustered Wound: No Photos Wound Measurements Length: (cm) 3.5 Width: (cm) 2.3 Depth: (cm) 0.3  Area: (cm) 6.322 Volume: (cm) 1.897 % Reduction in Area: 0% % Reduction in Volume: 0% Epithelialization: None Tunneling: No Undermining: No Wound Description Classification: Full Thickness Without Exposed Support Structures Wound Margin: Distinct, outline attached Exudate Amount: Large Exudate Type: Serosanguineous Exudate Color: red, brown Foul Odor After Cleansing: Yes Due to Product Use: No Slough/Fibrino Yes Wound Bed Granulation Amount: Medium (34-66%) Exposed Structure Granulation Quality: Red, Pink Fascia Exposed: No Necrotic Amount: Medium (34-66%) Fat Layer (Subcutaneous Tissue) Exposed: No Necrotic Quality: Adherent Slough Tendon Exposed: No Muscle Exposed: No Joint Exposed: No Bone Exposed: No Treatment Notes Wound #6 (Lower Leg) Wound Laterality: Right, Anterior Cleanser Soap and Water Discharge Instruction: May shower and wash wound with dial antibacterial soap and water prior to dressing change. Peri-Wound Care Triamcinolone 15 (g) Discharge Instruction: Use triamcinolone 15 (g) as directed Sween Lotion (Moisturizing lotion) Discharge Instruction: Apply moisturizing lotion as directed Topical Primary Dressing KerraCel Ag Gelling Fiber Dressing, 2x2 in (silver alginate) Discharge Instruction: Apply silver alginate to wound bed as instructed Secondary Dressing Woven Gauze Sponge, Non-Sterile 4x4 in Discharge Instruction: Apply over primary dressing as  directed. ABD Pad, 8x10 Discharge Instruction: Apply over primary dressing as directed. Secured With Compression Wrap Kerlix Roll 4.5x3.1 (in/yd) Discharge Instruction: Apply Kerlix and Coban compression as directed. Coban Self-Adherent Wrap 4x5 (in/yd) Discharge Instruction: Apply over Kerlix as directed. Compression Stockings Add-Ons Electronic Signature(s) Signed: 06/11/2020 5:06:59 PM By: Karl Ito Signed: 06/11/2020 5:30:27 PM By: Fonnie Mu RN Entered By: Karl Ito on 06/11/2020 16:36:58 -------------------------------------------------------------------------------- Wound Assessment Details Patient Name: Date of Service: [name], Gaetana A. 06/11/2020 1:15 PM Medical Record Number: [id number] Patient Account Number: [id number] Date of Birth/Sex: Treating RN: April 11, 1925 (87 y.o. Ardis Rowan, [name] Primary Care Catina Nuss: Tresa Garter Other Clinician: Referring Adore Kithcart: Treating Jeffree Cazeau/Extender: Gwynneth Aliment in Treatment: 0 Wound Status Wound Number: 7 Primary Malignant Wound Etiology: Wound Location: Left, Anterior Lower Leg Wound Open Wounding Event: Blister Status: Date Acquired: 04/20/2020 Comorbid Anemia, Arrhythmia, Congestive Heart Failure, Hypertension, Weeks Of Treatment: 0 History: Colitis, Osteoarthritis Clustered Wound: No Photos Wound Measurements Length: (cm) 2.3 Width: (cm) 1.7 Depth: (cm) 0.1 Area: (cm) 3.071 Volume: (cm) 0.307 % Reduction in Area: 0% % Reduction in Volume: 0% Epithelialization: None Tunneling: No Undermining: No Wound Description Classification: Full Thickness Without Exposed Support Structu Wound Margin: Distinct, outline attached Exudate Amount: Large Exudate Type: Serosanguineous Exudate Color: red, brown res Foul Odor After Cleansing: Yes Due to Product Use: No Slough/Fibrino Yes Wound Bed Granulation Amount: None Present (0%) Exposed Structure Necrotic  Amount: Large (67-100%) Fascia Exposed: No Necrotic Quality: Eschar, Adherent Slough Fat Layer (Subcutaneous Tissue) Exposed: No Tendon Exposed: No Muscle Exposed: No Joint Exposed: No Bone Exposed: No Treatment Notes Wound #7 (Lower Leg) Wound Laterality: Left, Anterior Cleanser Soap and Water Discharge Instruction: May shower and wash wound with dial antibacterial soap and water prior to dressing change. Peri-Wound Care Triamcinolone 15 (g) Discharge Instruction: Use triamcinolone 15 (g) as directed Sween Lotion (Moisturizing lotion) Discharge Instruction: Apply moisturizing lotion as directed Topical Primary Dressing KerraCel Ag Gelling Fiber Dressing, 2x2 in (silver alginate) Discharge Instruction: Apply silver alginate to wound bed as instructed Secondary Dressing Woven Gauze Sponge, Non-Sterile 4x4 in Discharge Instruction: Apply over primary dressing as directed. ABD Pad, 8x10 Discharge Instruction: Apply over primary dressing as directed. Secured With Compression Wrap Kerlix Roll 4.5x3.1 (in/yd) Discharge Instruction: Apply Kerlix and Coban compression as directed. Coban Self-Adherent Wrap 4x5 (in/yd) Discharge Instruction: Apply over Kerlix as  directed. Compression Stockings Add-Ons Electronic Signature(s) Signed: 06/11/2020 5:06:59 PM By: Sandre Kitty Signed: 06/11/2020 5:30:27 PM By: Rhae Hammock RN Entered By: Sandre Kitty on 06/11/2020 16:37:14 -------------------------------------------------------------------------------- Shepherd Details Patient Name: Date of Service: [name], [place] 06/11/2020 1:15 PM Medical Record Number: [id number] Patient Account Number: [id number] Date of Birth/Sex: Treating RN: [birth year] (87 y.o. [name], [name] Primary Care Katherine Syme: [name] Other Clinician: Referring Anthany Thornhill: Treating Ramiya Delahunty/Extender: [name] in Treatment: 0 Vital Signs Time Taken:  13:37 Temperature (F): 98.4 Height (in): 65 Pulse (bpm): 70 Source: Stated Respiratory Rate (breaths/min): 17 Weight (lbs): 163 Blood Pressure (mmHg): 130/90 Source: Stated Reference Range: 80 - 120 mg / dl Body Mass Index (BMI): 27.1 Electronic Signature(s) Signed: 06/11/2020 5:30:27 PM By: Rhae Hammock RN Entered By: Rhae Hammock on 06/11/2020 13:37:58

## 2020-06-11 NOTE — Progress Notes (Addendum)
[name] ([id number]) , Visit Report for 06/11/2020 Chief Complaint Document Details Patient Name: Date of Service: [name], [place] 06/11/2020 1:15 PM Medical Record Number: [id number] Patient Account Number: [id number] Date of Birth/Sex: Treating RN: [birth year] (87 y.o. [name] Primary Care Provider: [name] Other Clinician: Referring Provider: Treating Provider/Extender: [name] in Treatment: 0 Information Obtained from: Patient Chief Complaint Bilateral lower extremity wounds that have been biopsied and positive for squamous cell carcinoma Electronic Signature(s) Signed: 06/11/2020 5:32:23 PM By: [name] Shan DO Entered By: [name] on 06/11/2020 17:13:38 -------------------------------------------------------------------------------- Debridement Details Patient Name: Date of Service: [name], [name]. 06/11/2020 1:15 PM Medical Record Number: [id number] Patient Account Number: [id number] Date of Birth/Sex: Treating RN: 12/08/1925 (87 y.o. [name] Primary Care Provider: [name] Other Clinician: Referring Provider: Treating Provider/Extender: [name] in Treatment: 0 Debridement Performed for Assessment: Wound #6 Bell,Anterior Lower Leg Performed By: Clinician [name] Hurst, RN Debridement Type: Chemical/Enzymatic/Mechanical Agent Used: Anasept and gauze to remove biofilm Level of Consciousness (Pre-procedure): Awake and Alert Pre-procedure Verification/Time Out No Taken: Bleeding: None Response to Treatment: Procedure was tolerated well Level of Consciousness (Post- Awake and Alert procedure): Post Debridement Measurements of Total Wound Length: (cm) 3.5 Width: (cm) 2.3 Depth: (cm) 0.3 Volume: (cm) 1.897 Character of Wound/Ulcer Post Debridement: Stable Post Procedure Diagnosis Same as Pre-procedure Electronic Signature(s) Signed:  06/11/2020 5:27:53 PM By: [name] Hurst RN, BSN Signed: 06/11/2020 5:32:23 PM By: [name] Shan DO Entered By: [name] on 06/11/2020 15:19:24 -------------------------------------------------------------------------------- Debridement Details Patient Name: Date of Service: [name], [name]. 06/11/2020 1:15 PM Medical Record Number: [id number] Patient Account Number: [id number] Date of Birth/Sex: Treating RN: [birth year] (87 y.o. [name] Primary Care Provider: [name] Other Clinician: Referring Provider: Treating Provider/Extender: [name] in Treatment: 0 Debridement Performed for Assessment: Wound #7 Left,Anterior Lower Leg Performed By: Clinician [name] Hurst, RN Debridement Type: Chemical/Enzymatic/Mechanical Agent Used: Anasept and gauze to remove biofilm Level of Consciousness (Pre-procedure): Awake and Alert Pre-procedure Verification/Time Out No Taken: Bleeding: None Response to Treatment: Procedure was tolerated well Level of Consciousness (Post- Awake and Alert procedure): Post Debridement Measurements of Total Wound Length: (cm) 2.3 Width: (cm) 1.7 Depth: (cm) 0.1 Volume: (cm) 0.307 Character of Wound/Ulcer Post Debridement: Stable Post Procedure Diagnosis Same as Pre-procedure Electronic Signature(s) Signed: 06/11/2020 5:27:53 PM By: [name] Hurst RN, BSN Signed: 06/11/2020 5:32:23 PM By: [name] Shan DO Entered By: [name] on 06/11/2020 15:19:50 -------------------------------------------------------------------------------- HPI Details Patient Name: Date of Service: [name], [name]. 06/11/2020 1:15 PM Medical Record Number: [id number] Patient Account Number: [id number] Date of Birth/Sex: Treating RN: 06-30-1925 (87 y.o. [name] Primary Care Provider: [name] Other Clinician: Referring Provider: Treating Provider/Extender: [name] in Treatment: 0 History of Present Illness Location: left leg HPI Description: Admission 5/23 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 She states that the wound on the Bell has been present for Bell year. The 1 on the left has opened up 1 month ago. She is followed with oncology for this issue as she had biopsies that showed squamous cell carcinoma. She is also seeing radiation oncology for treatment options. She presents today because  she would like for her wounds to be healed by [place]. She currently denies signs of infection. Electronic Signature(s) Signed: 06/11/2020 5:32:23 PM By: [name] Corwin DO Entered By: [name] on 06/11/2020 17:13:42 -------------------------------------------------------------------------------- Physical Exam Details Patient Name: Date of Service: [name], [name]. 06/11/2020 1:15 PM Medical Record Number: [id number] Patient Account Number: [id number] Date of Birth/Sex: Treating RN: January 24, 1925 (87 y.o. [name] Primary Care Provider: Tresa Garter Other Clinician: Referring Provider: Treating Provider/Extender: Gwynneth Aliment in Treatment: 0 Constitutional respirations regular, non-labored and within target range for patient.. Cardiovascular 2+ dorsalis pedis/posterior tibialis pulses. Psychiatric pleasant and cooperative. Notes Bilateral lesions to the anterior shins with discoloration and irregular borders, no drainage noted. No signs of infection. Electronic Signature(s) Signed: 06/20/2020 2:46:26 PM By: [name] Corwin DO Entered By: [name] on 06/20/2020  14:45:58 -------------------------------------------------------------------------------- Physician Orders Details Patient Name: Date of Service: [name], [name]. 06/11/2020 1:15 PM Medical Record Number: [id number] Patient Account Number: [id number] Date of Birth/Sex: Treating RN: Feb 13, 1925 (87 y.o. [name] Primary Care Provider: Tresa Garter Other Clinician: Referring Provider: Treating Provider/Extender: Gwynneth Aliment in Treatment: 0 Verbal / Phone Orders: No Diagnosis Coding Follow-up Appointments ppointment in 1 week. - with Dr. Mikey Bussing Return Bell Bathing/ Shower/ Hygiene May shower with protection but do not get wound dressing(s) wet. - Use cast protector Edema Control - Lymphedema / SCD / Other Elevate legs to the level of the heart or above for 30 minutes daily and/or when sitting, Bell frequency of: - throughout the day Avoid standing for long periods of time. Exercise regularly Wound Treatment Wound #6 - Lower Leg Wound Laterality: Bell, Anterior Cleanser: Soap and Water 1 x Per Week/7 Days Discharge Instructions: May shower and wash wound with dial antibacterial soap and water prior to dressing change. Peri-Wound Care: Triamcinolone 15 (g) 1 x Per Week/7 Days Discharge Instructions: Use triamcinolone 15 (g) as directed Peri-Wound Care: Sween Lotion (Moisturizing lotion) 1 x Per Week/7 Days Discharge Instructions: Apply moisturizing lotion as directed Prim Dressing: KerraCel Ag Gelling Fiber Dressing, 2x2 in (silver alginate) ary 1 x Per Week/7 Days Discharge Instructions: Apply silver alginate to wound bed as instructed Secondary Dressing: Woven Gauze Sponge, Non-Sterile 4x4 in 1 x Per Week/7 Days Discharge Instructions: Apply over primary dressing as directed. Secondary Dressing: ABD Pad, 8x10 1 x Per Week/7 Days Discharge Instructions: Apply over primary dressing as directed. Compression Wrap: Kerlix Roll 4.5x3.1 (in/yd) 1  x Per Week/7 Days Discharge Instructions: Apply Kerlix and Coban compression as directed. Compression Wrap: Coban Self-Adherent Wrap 4x5 (in/yd) 1 x Per Week/7 Days Discharge Instructions: Apply over Kerlix as directed. Wound #7 - Lower Leg Wound Laterality: Left, Anterior Cleanser: Soap and Water 1 x Per Week/7 Days Discharge Instructions: May shower and wash wound with dial antibacterial soap and water prior to dressing change. Peri-Wound Care: Triamcinolone 15 (g) 1 x Per Week/7 Days Discharge Instructions: Use triamcinolone 15 (g) as directed Peri-Wound Care: Sween Lotion (Moisturizing lotion) 1 x Per Week/7 Days Discharge Instructions: Apply moisturizing lotion as directed Prim Dressing: KerraCel Ag Gelling Fiber Dressing, 2x2 in (silver alginate) 1 x Per Week/7 Days ary Discharge Instructions: Apply silver alginate to wound bed as instructed Secondary Dressing: Woven Gauze Sponge, Non-Sterile 4x4 in 1 x Per Week/7 Days Discharge Instructions: Apply over primary dressing as directed. Secondary Dressing: ABD Pad, 8x10 1 x Per Week/7 Days Discharge Instructions: Apply over primary dressing as directed. Compression Wrap: Kerlix  Roll 4.5x3.1 (in/yd) 1 x Per Week/7 Days Discharge Instructions: Apply Kerlix and Coban compression as directed. Compression Wrap: Coban Self-Adherent Wrap 4x5 (in/yd) 1 x Per Week/7 Days Discharge Instructions: Apply over Kerlix as directed. Services and Therapies rterial Studies- Bilateral w/ABIs and TBIs - Non compressible ABIs in clinic Bell Electronic Signature(s) Signed: 06/11/2020 5:27:53 PM By: [name] Hurst RN, BSN Signed: 06/11/2020 5:32:23 PM By: [name] Shan DO Entered By: [name] on 06/11/2020 15:24:48 -------------------------------------------------------------------------------- Problem List Details Patient Name: Date of Service: [name], [name] Cerrillos. 06/11/2020 1:15 PM Medical Record Number: [id number] Patient Account Number:  [id number] Date of Birth/Sex: Treating RN: 12-28-1925 (87 y.o. 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 06/11/2020 1:15 PM Medical Record Number: [id number] Patient Account Number: [id number] Date of Birth/Sex: Treating RN: Aug 03, 1925 (87 y.o. [name] Primary Care Provider: [name] Other Clinician: Referring Provider: Treating Provider/Extender: [name] in Treatment: 0 Subjective Chief Complaint Information obtained from Patient Bilateral lower extremity wounds that have been biopsied and positive for squamous cell carcinoma History of Present Illness (HPI) The following HPI elements were documented for the patient's wound: Location: left leg Admission 5/23 Ms. 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 She  states that the wound on the Bell has been present for Bell year. The 1 on the left has opened up 1 month ago. She is followed with oncology for this issue as she had biopsies that showed squamous cell carcinoma. She is also seeing radiation oncology for treatment options. She presents today because she would like for her wounds to be healed by [place]. She currently denies signs of infection. Patient History Information obtained from Patient. Allergies Zosyn, atorvastatin, atenolol, clarithromycin, codeine, Levaquin, Macrodantin, oxycodone Family History Unknown History. Social History Never smoker, Marital Status - Single, Alcohol Use - Never, Drug Use - No History, Caffeine Use - Never. Medical History Eyes Denies history of Cataracts, Optic Neuritis Ear/Nose/Mouth/Throat Denies history of Chronic sinus problems/congestion, Middle ear problems Hematologic/Lymphatic Patient has history of Anemia Denies history of Hemophilia, Human Immunodeficiency Virus, Lymphedema, Sickle Cell Disease Respiratory Denies history of Aspiration, Asthma, Chronic Obstructive Pulmonary Disease (COPD), Pneumothorax, Sleep Apnea, Tuberculosis Cardiovascular Patient has history of Arrhythmia - Atrial Flutter, Bell fibb, Congestive Heart Failure, Hypertension Denies history of Angina, Coronary Artery Disease, Deep Vein Thrombosis, Hypotension, Myocardial Infarction, Peripheral Arterial Disease, Peripheral Venous Disease, Phlebitis, Vasculitis Gastrointestinal Patient has history of Colitis Denies history of Cirrhosis , Crohnoos, Hepatitis Bell, Hepatitis B, Hepatitis C Endocrine Denies history of Type I Diabetes, Type II Diabetes Genitourinary Denies history of End Stage Renal Disease Immunological Denies history of Lupus Erythematosus,  Raynaudoos, Scleroderma Integumentary (Skin) Denies history of History of Burn Musculoskeletal Patient has history of Osteoarthritis Denies history of Gout, Rheumatoid  Arthritis, Osteomyelitis Neurologic Denies history of Dementia, Neuropathy, Quadriplegia, Paraplegia, Seizure Disorder Oncologic Denies history of Received Chemotherapy, Received Radiation Hospitalization/Surgery History - removal of rod left leg. Medical Bell Surgical History Notes nd Cardiovascular hyperlipidemia Endocrine hypothyroidism Neurologic lumbar spindylolysis Oncologic BLE squamous ceel carcionoma Review of Systems (ROS) Constitutional Symptoms (General Health) Denies complaints or symptoms of Fatigue, Fever, Chills, Marked Weight Change. Eyes Denies complaints or symptoms of Dry Eyes, Vision Changes, Glasses / Contacts. Ear/Nose/Mouth/Throat Denies complaints or symptoms of Chronic sinus problems or rhinitis. Respiratory Denies complaints or symptoms of Chronic or frequent coughs, Shortness of Breath. Cardiovascular Denies complaints or symptoms of Chest pain. Gastrointestinal Denies complaints or symptoms of Frequent diarrhea, Nausea, Vomiting. Endocrine Denies complaints or symptoms of Heat/cold intolerance. Genitourinary Denies complaints or symptoms of Frequent urination. Integumentary (Skin) Complains or has symptoms of Wounds. Musculoskeletal Denies complaints or symptoms of Muscle Pain, Muscle Weakness. Neurologic Denies complaints or symptoms of Numbness/parasthesias. Psychiatric Denies complaints or symptoms of Claustrophobia, Suicidal. Objective Constitutional Vitals Time Taken: 1:37 PM, Height: 65 in, Source: Stated, Weight: 163 lbs, Source: Stated, BMI: 27.1, Temperature: 98.4 F, Pulse: 70 bpm, Respiratory Rate: 17 breaths/min, Blood Pressure: 130/90 mmHg. Integumentary (Hair, Skin) Wound #6 status is Open. Original cause of wound was Blister. The date acquired was: 04/20/2020. The wound is located on the Bell,Anterior Lower Leg. The wound measures 3.5cm length x 2.3cm width x 0.3cm depth; 6.322cm^2 area and 1.897cm^3 volume. There is no tunneling  or undermining noted. There is Bell large amount of serosanguineous drainage noted. Foul odor after cleansing was noted. The wound margin is distinct with the outline attached to the wound base. There is medium (34-66%) red, pink granulation within the wound bed. There is Bell medium (34-66%) amount of necrotic tissue within the wound bed including Adherent Slough. Wound #7 status is Open. Original cause of wound was Blister. The date acquired was: 04/20/2020. The wound is located on the Left,Anterior Lower Leg. The wound measures 2.3cm length x 1.7cm width x 0.1cm depth; 3.071cm^2 area and 0.307cm^3 volume. There is no tunneling or undermining noted. There is Bell large amount of serosanguineous drainage noted. Foul odor after cleansing was noted. The wound margin is distinct with the outline attached to the wound base. There is no granulation within the wound bed. There is Bell large (67-100%) amount of necrotic tissue within the wound bed including Eschar and Adherent Slough. 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 She followed up with her oncologist however this issue was not addressed because it was for her breast cancer follow-up. She has seen radiation oncology for this issue and noted that the skin cancer is at least stage T3 N0 M0. Since her dermatologist does not recommend surgery an alternative palliative treatment option would be Bell modified less aggressive course of radiation therapy. She was referred to [place] by radiation oncology. Patient and daughter are Hopefull that the wounds will close with wound care. She  states that since we healed her previous trauma wound that we might be able to heal her current wound. We had Bell  long discussion about the prospect of these closing since it is cancer related. I said we would try wound care however it was very unlikely that these wounds would close. For now we can do formal ABIs to assure that we can do compression therapy to control the swelling and use silver alginate underneath the wraps. For now we will do Kerlix Coban with silver alginate. I offered juxta lite compressions however patient does not want to order these at this time. Procedures Wound #6 Pre-procedure diagnosis of Wound #6 is Bell Malignant Wound located on the Bell,Anterior Lower Leg . There was Bell Chemical/Enzymatic/Mechanical debridement performed by [name] Hurst, RN.. Other agent used was Anasept and gauze to remove biofilm. There was no bleeding. The procedure was tolerated well. Post Debridement Measurements: 3.5cm length x 2.3cm width x 0.3cm depth; 1.897cm^3 volume. Character of Wound/Ulcer Post Debridement is stable. Post procedure Diagnosis Wound #6: Same as Pre-Procedure Wound #7 Pre-procedure diagnosis of Wound #7 is Bell Malignant Wound located on the Left,Anterior Lower Leg . There was Bell Chemical/Enzymatic/Mechanical debridement performed by [name] Hurst, RN.. Other agent used was Anasept and gauze to remove biofilm. There was no bleeding. The procedure was tolerated well. Post Debridement Measurements: 2.3cm length x 1.7cm width x 0.1cm depth; 0.307cm^3 volume. Character of Wound/Ulcer Post Debridement is stable. Post procedure Diagnosis Wound #7: Same as Pre-Procedure Plan Follow-up Appointments: Return Appointment in 1 week. - with Dr. Heber Tillman Bathing/ Shower/ Hygiene: May shower with protection but do not get wound dressing(s) wet. - Use cast protector Edema Control - Lymphedema / SCD / Other: Elevate legs to the level of the heart or above for 30 minutes daily and/or when  sitting, Bell frequency of: - throughout the day Avoid standing for long periods of time. Exercise regularly Services and Therapies ordered were: Arterial Studies- Bilateral w/ABIs and TBIs - Non compressible ABIs in clinic WOUND #6: - Lower Leg Wound Laterality: Bell, Anterior Cleanser: Soap and Water 1 x Per Week/7 Days Discharge Instructions: May shower and wash wound with dial antibacterial soap and water prior to dressing change. Peri-Wound Care: Triamcinolone 15 (g) 1 x Per Week/7 Days Discharge Instructions: Use triamcinolone 15 (g) as directed Peri-Wound Care: Sween Lotion (Moisturizing lotion) 1 x Per Week/7 Days Discharge Instructions: Apply moisturizing lotion as directed Prim Dressing: KerraCel Ag Gelling Fiber Dressing, 2x2 in (silver alginate) 1 x Per Week/7 Days ary Discharge Instructions: Apply silver alginate to wound bed as instructed Secondary Dressing: Woven Gauze Sponge, Non-Sterile 4x4 in 1 x Per Week/7 Days Discharge Instructions: Apply over primary dressing as directed. Secondary Dressing: ABD Pad, 8x10 1 x Per Week/7 Days Discharge Instructions: Apply over primary dressing as directed. Com pression Wrap: Kerlix Roll 4.5x3.1 (in/yd) 1 x Per Week/7 Days Discharge Instructions: Apply Kerlix and Coban compression as directed. Com pression Wrap: Coban Self-Adherent Wrap 4x5 (in/yd) 1 x Per Week/7 Days Discharge Instructions: Apply over Kerlix as directed. WOUND #7: - Lower Leg Wound Laterality: Left, Anterior Cleanser: Soap and Water 1 x Per Week/7 Days Discharge Instructions: May shower and wash wound with dial antibacterial soap and water prior to dressing change. Peri-Wound Care: Triamcinolone 15 (g) 1 x Per Week/7 Days Discharge Instructions: Use triamcinolone 15 (g) as directed Peri-Wound Care: Sween Lotion (Moisturizing lotion) 1 x Per Week/7 Days Discharge Instructions: Apply moisturizing lotion as directed Prim Dressing: KerraCel Ag Gelling Fiber Dressing, 2x2  in (silver alginate) 1 x Per Week/7 Days ary Discharge Instructions: Apply silver alginate to wound bed as instructed Secondary  Dressing: Woven Gauze Sponge, Non-Sterile 4x4 in 1 x Per Week/7 Days Discharge Instructions: Apply over primary dressing as directed. Secondary Dressing: ABD Pad, 8x10 1 x Per Week/7 Days Discharge Instructions: Apply over primary dressing as directed. Compression Wrap: Kerlix Roll 4.5x3.1 (in/yd) 1 x Per Week/7 Days Discharge Instructions: Apply Kerlix and Coban compression as directed. Compression Wrap: Coban Self-Adherent Wrap 4x5 (in/yd) 1 x Per Week/7 Days Discharge Instructions: Apply over Kerlix as directed. 1. Silver alginate under Kerlix/Coban bilaterally 2. ABIs 3. Follow-up in 1 week Electronic Signature(s) Signed: 06/11/2020 5:32:23 PM By: [name] Shan DO Entered By: [name] on 06/11/2020 17:31:01 -------------------------------------------------------------------------------- HxROS Details Patient Name: Date of Service: [name], [place]. 06/11/2020 1:15 PM Medical Record Number: [id number] Patient Account Number: [id number] Date of Birth/Sex: Treating RN: 13-Jan-1926 (87 y.o. [name], [name] Primary Care Provider: [name] Other Clinician: Referring Provider: Treating Provider/Extender: [name] in Treatment: 0 Information Obtained From Patient Constitutional Symptoms (General Health) Complaints and Symptoms: Negative for: Fatigue; Fever; Chills; Marked Weight Change Eyes Complaints and Symptoms: Negative for: Dry Eyes; Vision Changes; Glasses / Contacts Medical History: Negative for: Cataracts; Optic Neuritis Ear/Nose/Mouth/Throat Complaints and Symptoms: Negative for: Chronic sinus problems or rhinitis Medical History: Negative for: Chronic sinus problems/congestion; Middle ear problems Respiratory Complaints and Symptoms: Negative for: Chronic or frequent coughs;  Shortness of Breath Medical History: Negative for: Aspiration; Asthma; Chronic Obstructive Pulmonary Disease (COPD); Pneumothorax; Sleep Apnea; Tuberculosis Cardiovascular Complaints and Symptoms: Negative for: Chest pain Medical History: Positive for: Arrhythmia - Atrial Flutter, Bell fibb; Congestive Heart Failure; Hypertension Negative for: Angina; Coronary Artery Disease; Deep Vein Thrombosis; Hypotension; Myocardial Infarction; Peripheral Arterial Disease; Peripheral Venous Disease; Phlebitis; Vasculitis Past Medical History Notes: hyperlipidemia Gastrointestinal Complaints and Symptoms: Negative for: Frequent diarrhea; Nausea; Vomiting Medical History: Positive for: Colitis Negative for: Cirrhosis ; Crohns; Hepatitis Bell; Hepatitis B; Hepatitis C Endocrine Complaints and Symptoms: Negative for: Heat/cold intolerance Medical History: Negative for: Type I Diabetes; Type II Diabetes Past Medical History Notes: hypothyroidism Genitourinary Complaints and Symptoms: Negative for: Frequent urination Medical History: Negative for: End Stage Renal Disease Integumentary (Skin) Complaints and Symptoms: Positive for: Wounds Medical History: Negative for: History of Burn Musculoskeletal Complaints and Symptoms: Negative for: Muscle Pain; Muscle Weakness Medical History: Positive for: Osteoarthritis Negative for: Gout; Rheumatoid Arthritis; Osteomyelitis Neurologic Complaints and Symptoms: Negative for: Numbness/parasthesias Medical History: Negative for: Dementia; Neuropathy; Quadriplegia; Paraplegia; Seizure Disorder Past Medical History Notes: lumbar spindylolysis Psychiatric Complaints and Symptoms: Negative for: Claustrophobia; Suicidal Hematologic/Lymphatic Medical History: Positive for: Anemia Negative for: Hemophilia; Human Immunodeficiency Virus; Lymphedema; Sickle Cell Disease Immunological Medical History: Negative for: Lupus Erythematosus; Raynauds;  Scleroderma Oncologic Medical History: Negative for: Received Chemotherapy; Received Radiation Past Medical History Notes: BLE squamous ceel carcionoma Immunizations Pneumococcal Vaccine: Received Pneumococcal Vaccination: Yes Implantable Devices None Hospitalization / Surgery History Type of Hospitalization/Surgery removal of rod left leg Family and Social History Unknown History: Yes; Never smoker; Marital Status - Single; Alcohol Use: Never; Drug Use: No History; Caffeine Use: Never; Financial Concerns: No; Food, Clothing or Shelter Needs: No; Support System Lacking: No; Transportation Concerns: No Electronic Signature(s) Signed: 06/11/2020 5:30:27 PM By: Rhae Hammock RN Signed: 06/11/2020 5:32:23 PM By: [name] Shan DO Entered By: Rhae Hammock on 06/11/2020 13:50:58 -------------------------------------------------------------------------------- SuperBill Details Patient Name: Date of Service: [name], [name]. 06/11/2020 Medical Record Number: [id number] Patient Account Number: [id number] Date of Birth/Sex: Treating RN: 02/26/1925 (87 y.o. 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 L97.829 Non-pressure chronic ulcer of other part of left lower leg with unspecified severity I87.2 Venous insufficiency (chronic) (peripheral) Facility Procedures CPT4 Code: 93818299 Description: 37169 - WOUND CARE VISIT-LEV 4 EST PT Modifier: 25 Quantity: 1 CPT4 Code: 67893810 Description: 17510 - DEBRIDE W/O ANES NON SELECT Modifier: Quantity: 1 Physician Procedures : CPT4 Code Description Modifier [id number] WC PHYS LEVEL 3 NEW PT ICD-10  Diagnosis Description C44.92 Squamous cell carcinoma of skin, unspecified L97.819 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

## 2020-06-11 NOTE — Progress Notes (Signed)
[name] ([id number]) , Visit Report for 06/11/2020 Abuse/Suicide Risk Screen Details Patient Name: Date of Service: [name], [place] 06/11/2020 1:15 PM Medical Record Number: [id number] Patient Account Number: [id number] Date of Birth/Sex: Treating RN: 07/18/1925 (87 y.o. [name], [name] Primary Care Clark Cuff: [name] Other Clinician: Referring Calel Pisarski: Treating Viyaan Champine/Extender: [name] in Treatment: 0 Abuse/Suicide Risk Screen Items Answer ABUSE RISK SCREEN: Has anyone close to you tried to hurt or harm you recentlyo No Do you feel uncomfortable with anyone in your familyo No Has anyone forced you do things that you didnt want to doo No Electronic Signature(s) Signed: 06/11/2020 5:30:27 PM By: Rhae Hammock RN Entered By: Rhae Hammock on 06/11/2020 13:51:32 -------------------------------------------------------------------------------- Activities of Daily Living Details Patient Name: Date of Service: [name], Zahraa A. 06/11/2020 1:15 PM Medical Record Number: [id number] Patient Account Number: [id number] Date of Birth/Sex: Treating RN: 1925-02-01 (87 y.o. [name], [name] Primary Care Cincere Deprey: [name] Other Clinician: Referring Calvert Charland: Treating Michaeline Eckersley/Extender: [name] in Treatment: 0 Activities of Daily Living Items Answer Activities of Daily Living (Please select one for each item) Drive Automobile Not Able T Medications ake Need Assistance Use T elephone Completely Able Care for Appearance Completely Able Use T oilet Completely Able Bath / Shower Completely Able Dress Self Completely Able Feed Self Completely Able Walk Need Assistance Get In / Out Bed Completely [place] Need Assistance Shop for Self Need Assistance Electronic Signature(s) Signed: 06/11/2020 5:30:27 PM By:  Rhae Hammock RN Entered By: Rhae Hammock on 06/11/2020 13:52:58 -------------------------------------------------------------------------------- Education Screening Details Patient Name: Date of Service: [name], Denika A. 06/11/2020 1:15 PM Medical Record Number: [id number] Patient Account Number: [id number] Date of Birth/Sex: Treating RN: December 28, 1925 (87 y.o. [name], [name] Primary Care Karizma Cheek: [name] Other Clinician: Referring Sheetal Lyall: Treating Karthika Glasper/Extender: [name] in Treatment: 0 Primary Learner Assessed: Patient Learning Preferences/Education Level/Primary Language Learning Preference: Explanation, Demonstration, Communication Board Highest Education Level: High School Preferred Language: English Cognitive Barrier Language Barrier: No Translator Needed: No Memory Deficit: No Emotional Barrier: No Cultural/Religious Beliefs Affecting Medical Care: No Physical Barrier Impaired Vision: Yes Glasses Impaired Hearing: No Decreased Hand dexterity: No Knowledge/Comprehension Knowledge Level: High Comprehension Level: High Ability to understand written instructions: High Ability to understand verbal instructions: High Motivation Anxiety Level: Calm Cooperation: Cooperative Education Importance: Denies Need Interest in Health Problems: Asks Questions Perception: Coherent Willingness to Engage in Self-Management High Activities: Readiness to Engage in Self-Management High Activities: Electronic Signature(s) Signed: 06/11/2020 5:30:27 PM By: Rhae Hammock RN Entered By: Rhae Hammock on 06/11/2020 13:53:30 -------------------------------------------------------------------------------- Fall Risk Assessment Details Patient Name: Date of Service: [name], [place]. 06/11/2020 1:15 PM Medical Record Number: [id number] Patient Account Number: [id number] Date of Birth/Sex: Treating RN: 03-17-1925  (87 y.o. [name], [name] Primary Care Xenia Nile: [name] Other Clinician: Referring Jaia Alonge: Treating Ulrich Soules/Extender: [name] in Treatment: 0 Fall Risk Assessment Items Have you had 2 or more falls in the last 12 monthso 0 No Have you had any fall that resulted in injury in the last 12 monthso 0 No FALLS RISK SCREEN History of falling - immediate or within 3 months 0 No Secondary diagnosis (Do you have 2 or more medical diagnoseso) 0 No Ambulatory aid None/bed rest/wheelchair/nurse 0 No Crutches/cane/walker 0 No Furniture 0 No Intravenous therapy Access/Saline/Heparin Lock 0 No Gait/Transferring Normal/ bed rest/  wheelchair 0 No Weak (short steps with or without shuffle, stooped but able to lift head while walking, may seek 0 No support from furniture) Impaired (short steps with shuffle, may have difficulty arising from chair, head down, impaired 0 No balance) Mental Status Oriented to own ability 0 No Electronic Signature(s) Signed: 06/11/2020 5:30:27 PM By: Rhae Hammock RN Entered By: Rhae Hammock on 06/11/2020 13:53:36 -------------------------------------------------------------------------------- Foot Assessment Details Patient Name: Date of Service: [name], Julea A. 06/11/2020 1:15 PM Medical Record Number: [id number] Patient Account Number: [id number] Date of Birth/Sex: Treating RN: Jun 06, 1925 (87 y.o. [name], [name] Primary Care Sadae Arrazola: [name] Other Clinician: Referring Bryne Lindon: Treating Jag Lenz/Extender: [name] in Treatment: 0 Foot Assessment Items Site Locations + = Sensation present, - = Sensation absent, C = Callus, U = Ulcer R = Redness, W = Warmth, M = Maceration, PU = Pre-ulcerative lesion F = Fissure, S = Swelling, D = Dryness Assessment Bell: Left: Other Deformity: No No Prior Foot Ulcer: No No Prior Amputation: No  No Charcot Joint: No No Ambulatory Status: Ambulatory With Help Assistance Device: Wheelchair Gait: Steady Electronic Signature(s) Signed: 06/11/2020 5:30:27 PM By: Rhae Hammock RN Entered By: Rhae Hammock on 06/11/2020 13:58:45 -------------------------------------------------------------------------------- Nutrition Risk Screening Details Patient Name: Date of Service: [name], Westlynn A. 06/11/2020 1:15 PM Medical Record Number: [id number] Patient Account Number: [id number] Date of Birth/Sex: Treating RN: 1925/05/10 (87 y.o. [name], [name] Primary Care Estelle Skibicki: [name] Other Clinician: Referring Estell Dillinger: Treating Jahmai Finelli/Extender: [name] in Treatment: 0 Height (in): 65 Weight (lbs): 163 Body Mass Index (BMI): 27.1 Nutrition Risk Screening Items Score Screening NUTRITION RISK SCREEN: I have an illness or condition that made me change the kind and/or amount of food I eat 0 No I eat fewer than two meals per day 0 No I eat few fruits and vegetables, or milk products 0 No I have three or more drinks of beer, liquor or wine almost every day 0 No I have tooth or mouth problems that make it hard for me to eat 0 No I don't always have enough money to buy the food I need 0 No I eat alone most of the time 0 No I take three or more different prescribed or over-the-counter drugs a day 0 No Without wanting to, I have lost or gained 10 pounds in the last six months 0 No I am not always physically able to shop, cook and/or feed myself 0 No Nutrition Protocols Good Risk Protocol 0 No interventions needed Moderate Risk Protocol High Risk Proctocol Risk Level: Good Risk Score: 0 Electronic Signature(s) Signed: 06/11/2020 5:30:27 PM By: Rhae Hammock RN Entered By: Rhae Hammock on 06/11/2020 13:53:46

## 2020-06-12 ENCOUNTER — Telehealth: Payer: Self-pay

## 2020-06-12 ENCOUNTER — Telehealth: Payer: Self-pay | Admitting: Oncology

## 2020-06-12 NOTE — Telephone Encounter (Signed)
Scheduled appt per 5/23 sch msg. Pt aware.

## 2020-06-12 NOTE — Telephone Encounter (Signed)
Patient's daughter called regarding upcoming appointments in Sept.   Pt is currently seeing radiation and wound care so would like to cancel sept appointments.   Appointments canceled, they are aware to call if any concerns arise.

## 2020-06-13 ENCOUNTER — Other Ambulatory Visit (HOSPITAL_COMMUNITY): Payer: Self-pay | Admitting: Internal Medicine

## 2020-06-13 ENCOUNTER — Other Ambulatory Visit: Payer: Self-pay | Admitting: Oncology

## 2020-06-13 DIAGNOSIS — M79605 Pain in left leg: Secondary | ICD-10-CM

## 2020-06-13 DIAGNOSIS — M79604 Pain in right leg: Secondary | ICD-10-CM

## 2020-06-14 ENCOUNTER — Ambulatory Visit (HOSPITAL_COMMUNITY)
Admission: RE | Admit: 2020-06-14 | Discharge: 2020-06-14 | Disposition: A | Discharge status: Home / Self Care | Payer: Medicare Other | Source: Ambulatory Visit | Attending: Internal Medicine | Admitting: Internal Medicine

## 2020-06-14 ENCOUNTER — Telehealth: Payer: Self-pay | Admitting: *Deleted

## 2020-06-14 ENCOUNTER — Other Ambulatory Visit: Payer: Self-pay

## 2020-06-14 ENCOUNTER — Other Ambulatory Visit: Payer: Self-pay | Admitting: *Deleted

## 2020-06-14 DIAGNOSIS — M79605 Pain in left leg: Secondary | ICD-10-CM | POA: Diagnosis not present

## 2020-06-14 DIAGNOSIS — M79604 Pain in right leg: Secondary | ICD-10-CM | POA: Diagnosis not present

## 2020-06-14 NOTE — Progress Notes (Signed)
No entry 

## 2020-06-14 NOTE — Telephone Encounter (Signed)
This RN spoke with the pt's daughter [name] - per her call wanting to verify appt scheduled for 6/15 so she can make arrangements for transportation.  Verified above and call Ledon Snare states she will need to have her niece who lives locally call next week to reschedule due to need to make transportation arrangements.  This RN request niece contact this RN, with name given for contact to coordinate care.

## 2020-06-15 ENCOUNTER — Telehealth: Payer: Self-pay

## 2020-06-15 ENCOUNTER — Other Ambulatory Visit: Payer: Self-pay | Admitting: *Deleted

## 2020-06-18 ENCOUNTER — Other Ambulatory Visit: Payer: Self-pay | Admitting: Oncology

## 2020-06-18 DIAGNOSIS — Z7189 Other specified counseling: Secondary | ICD-10-CM

## 2020-06-18 DIAGNOSIS — C44721 Squamous cell carcinoma of skin of unspecified lower limb, including hip: Secondary | ICD-10-CM

## 2020-06-18 NOTE — Progress Notes (Signed)
START ON PATHWAY REGIMEN - Melanoma and Other Skin Cancers     A cycle is every 21 days:     Cemiplimab-rwlc   **Always confirm dose/schedule in your pharmacy ordering system**  Patient Characteristics: Cutaneous Squamous Cell Carcinoma, Locally Advanced - Unresectable, Systemic Therapy Indicated Disease Classification: Cutaneous Squamous Cell Carcinoma Therapeutic Status: Locally Advanced - Unresectable Click here if multiple primary tumors are present.: false Intent of Therapy: Non-Curative / Palliative Intent, Discussed with Patient 

## 2020-06-18 NOTE — Progress Notes (Unsigned)
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  I have discussed this with the radiation oncologist (Dr. Isidore Moos).  She thinks that while radiation may be able to sterilize the area it would probably leave bare bone at the base.  She recommends against it.  Accordingly we are going to try systemic therapy with cemiplimab.  I will try to see her June 1 (leave town June 2) and I have scheduled her to start on 07/04/2020 which is the first day I am back in clinic.[name]

## 2020-06-19 ENCOUNTER — Telehealth: Payer: Self-pay | Admitting: Oncology

## 2020-06-19 ENCOUNTER — Encounter: Payer: Self-pay | Admitting: Oncology

## 2020-06-19 NOTE — Telephone Encounter (Signed)
Spoke with patient's daughter [name] to clarify plan. She informed that moving forward, her niece Lattie Haw (patient's granddaughter) should be contacted with updates/scheduling appointments. No other needs identified at this time

## 2020-06-19 NOTE — Telephone Encounter (Signed)
Scheduled appts per 5/30 sch msg. Called pt, no answer. Left msg with appts date and times.

## 2020-06-20 ENCOUNTER — Telehealth: Payer: Self-pay | Admitting: *Deleted

## 2020-06-20 ENCOUNTER — Other Ambulatory Visit: Payer: Self-pay

## 2020-06-20 ENCOUNTER — Encounter: Payer: Self-pay | Admitting: Oncology

## 2020-06-20 ENCOUNTER — Other Ambulatory Visit: Payer: Medicare Other

## 2020-06-20 ENCOUNTER — Inpatient Hospital Stay: Payer: Medicare Other | Attending: Oncology | Admitting: Oncology

## 2020-06-20 ENCOUNTER — Encounter (HOSPITAL_BASED_OUTPATIENT_CLINIC_OR_DEPARTMENT_OTHER): Payer: Medicare Other | Admitting: Internal Medicine

## 2020-06-20 VITALS — BP 136/59 | HR 64 | Temp 97.7°F | Resp 18 | Ht 65.0 in | Wt 156.8 lb

## 2020-06-20 DIAGNOSIS — I872 Venous insufficiency (chronic) (peripheral): Secondary | ICD-10-CM | POA: Diagnosis not present

## 2020-06-20 DIAGNOSIS — C44719 Basal cell carcinoma of skin of left lower limb, including hip: Secondary | ICD-10-CM | POA: Insufficient documentation

## 2020-06-20 DIAGNOSIS — L97829 Non-pressure chronic ulcer of other part of left lower leg with unspecified severity: Secondary | ICD-10-CM | POA: Insufficient documentation

## 2020-06-20 DIAGNOSIS — Z79811 Long term (current) use of aromatase inhibitors: Secondary | ICD-10-CM | POA: Insufficient documentation

## 2020-06-20 DIAGNOSIS — C44721 Squamous cell carcinoma of skin of unspecified lower limb, including hip: Secondary | ICD-10-CM

## 2020-06-20 DIAGNOSIS — Z5112 Encounter for antineoplastic immunotherapy: Secondary | ICD-10-CM | POA: Diagnosis not present

## 2020-06-20 DIAGNOSIS — C4492 Squamous cell carcinoma of skin, unspecified: Secondary | ICD-10-CM | POA: Diagnosis not present

## 2020-06-20 DIAGNOSIS — L97819 Non-pressure chronic ulcer of other part of right lower leg with unspecified severity: Secondary | ICD-10-CM

## 2020-06-20 DIAGNOSIS — C50412 Malignant neoplasm of upper-outer quadrant of left female breast: Secondary | ICD-10-CM | POA: Diagnosis not present

## 2020-06-20 DIAGNOSIS — C44729 Squamous cell carcinoma of skin of left lower limb, including hip: Secondary | ICD-10-CM | POA: Insufficient documentation

## 2020-06-20 DIAGNOSIS — C4490 Unspecified malignant neoplasm of skin, unspecified: Secondary | ICD-10-CM | POA: Diagnosis not present

## 2020-06-20 DIAGNOSIS — Z853 Personal history of malignant neoplasm of breast: Secondary | ICD-10-CM | POA: Diagnosis not present

## 2020-06-20 DIAGNOSIS — Z79899 Other long term (current) drug therapy: Secondary | ICD-10-CM | POA: Insufficient documentation

## 2020-06-20 DIAGNOSIS — Z170 Estrogen receptor positive status [ER+]: Secondary | ICD-10-CM

## 2020-06-20 NOTE — Telephone Encounter (Signed)
  Patient Message Open   06/20/2020 [facility]   Magrinat, Virgie Dad, MD   Oncology    Conversation: Photo of Nyimah's legs (Newest Message First)  Scheck, [place] Lighter to Jana Hakim Virgie Dad, MD      06/20/20 3:28 PM From today's [place] appt. Attachments  [id number]   This encounter is not signed. The conversation may still be ongoing.  Additional Documentation  Encounter Info:  Billing Info,   History,   Allergies,   Detailed Report      Media From this encounter Scan on 06/20/2020 3:28 PM: [id number]    Linked Episodes   ONCOLOGY TREATMENT Noted 06/18/2020     Orders Placed   None   Medication Renewals and Changes    None   Medication List   Visit Diagnoses     None    Problem List

## 2020-06-20 NOTE — Progress Notes (Signed)
[name] ([id number]) , Visit Report for 06/20/2020 Chief Complaint Document Details Patient Name: Date of Service: [name], [place]. 06/20/2020 2:00 PM Medical Record Number: [id number] Patient Account Number: [id number] Date of Birth/Sex: Treating RN: 22-Oct-1925 (87 y.o. Elam Dutch Primary Care Provider: [name] Other Clinician: Referring Provider: Treating Provider/Extender: [name] in Treatment: 1 Information Obtained from: Patient Chief Complaint Bilateral lower extremity wounds that have been biopsied and positive for squamous cell carcinoma Electronic Signature(s) Signed: 06/20/2020 3:01:45 PM By: Kalman Shan DO Entered By: Kalman Shan on 06/20/2020 14:41:43 -------------------------------------------------------------------------------- HPI Details Patient Name: Date of Service: [name], [name] 06/20/2020 2:00 PM Medical Record Number: [id number] Patient Account Number: [id number] Date of Birth/Sex: Treating RN: February 03, 1925 (87 y.o. Elam Dutch Primary Care Provider: [name] Other Clinician: Referring Provider: Treating Provider/Extender: [name] in Treatment: 1 History of Present Illness Location: left leg HPI Description: Admission 5/23 Ms. 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 She states that the wound on the right has been present for a year. The 1 on the left has opened up 1 month ago. She is followed with oncology for this issue as she had biopsies that showed squamous cell carcinoma. She is also seeing radiation oncology for treatment options. She presents today because she would like for her wounds to be healed by  [place]. She currently denies signs of infection. 6/1; patient presents for 1 week follow-up. She states she has tolerated the leg wraps well. She states these do not bother her and is happy to continue with them. She is scheduled to see her oncologist today to go over treatment options for the bilateral lower extremity squamous cell carcinoma. Radiation is currently not a recommended option. Patient states she overall feels well. Electronic Signature(s) Signed: 06/20/2020 3:01:45 PM By: Kalman Shan DO Entered By: Kalman Shan on 06/20/2020 14:47:03 -------------------------------------------------------------------------------- Physical Exam Details Patient Name: Date of Service: [name], [place]. 06/20/2020 2:00 PM Medical Record Number: [id number] Patient Account Number: [id number] Date of Birth/Sex: Treating RN: 1925/04/25 (87 y.o. Elam Dutch Primary Care Provider: [name] Other Clinician: Referring Provider: Treating Provider/Extender: [name] in Treatment: 1 Constitutional respirations regular, non-labored and within target range for patient.. Cardiovascular 2+ dorsalis pedis/posterior tibialis pulses. Psychiatric pleasant and cooperative. Notes Bilateral lesions to the anterior shins with discoloration and irregular borders, no drainage noted. No signs of infection. Electronic Signature(s) Signed: 06/20/2020 3:01:45 PM By: Kalman Shan DO Entered By: Kalman Shan on 06/20/2020 14:47:30 -------------------------------------------------------------------------------- Physician Orders Details Patient Name: Date of Service: [name], [place]. 06/20/2020 2:00 PM Medical Record Number: [id number] Patient Account Number: [id number] Date of Birth/Sex: Treating RN: 06/26/1925 (87 y.o. [name] Primary Care Provider: [name] Other Clinician: Referring Provider: Treating Provider/Extender: [name] in Treatment: 1 Verbal / Phone Orders: No Diagnosis Coding ICD-10 Coding Code Description C44.92 Squamous cell carcinoma of skin, unspecified L97.819 Non-pressure chronic ulcer of other part of right lower leg with unspecified severity L97.829 Non-pressure chronic ulcer of other part of left lower leg with unspecified severity I87.2 Venous insufficiency (chronic) (peripheral) Follow-up Appointments ppointment in: - 07/11/20 with Dr. Heber [place] Return A Nurse Visit: - 06/27/20  and 07/04/20 (if Home health becomes established many cancel nurse visits) Bathing/ Shower/ Hygiene May shower with protection but do not get wound dressing(s) wet. - Use cast protector Edema Control - Lymphedema / SCD / Other Elevate legs to the level of the heart or above for 30 minutes daily and/or when sitting, a frequency of: - throughout the day Avoid standing for long periods of time. Exercise regularly Additional Orders / Instructions Follow [place] dmit to [place] for wound care. May utilize formulary equivalent dressing for wound treatment orders unless otherwise specified. - A Referral for Home Health sent for skilled nursing 1-2 wk for dressing/compression wrap change. Wound Treatment Wound #6 - Lower Leg Wound Laterality: Right, Anterior Cleanser: Soap and Water 1 x Per Week/7 Days Discharge Instructions: May shower and wash wound with dial antibacterial soap and water prior to dressing change. Peri-Wound Care: Triamcinolone 15 (g) 1 x Per Week/7 Days Discharge Instructions: Use triamcinolone 15 (g) as directed Peri-Wound Care: Sween Lotion (Moisturizing lotion) 1 x Per Week/7 Days Discharge Instructions: Apply moisturizing lotion as directed Prim Dressing: KerraCel Ag Gelling Fiber Dressing, 2x2 in (silver alginate) 1 x Per Week/7 Days ary Discharge Instructions: Apply silver alginate to wound bed as instructed Secondary Dressing: Woven Gauze  Sponge, Non-Sterile 4x4 in 1 x Per Week/7 Days Discharge Instructions: Apply over primary dressing as directed. Secondary Dressing: ABD Pad, 8x10 1 x Per Week/7 Days Discharge Instructions: Apply over primary dressing as directed. Compression Wrap: Kerlix Roll 4.5x3.1 (in/yd) 1 x Per Week/7 Days Discharge Instructions: Apply Kerlix and Coban compression as directed. Compression Wrap: Coban Self-Adherent Wrap 4x5 (in/yd) 1 x Per Week/7 Days Discharge Instructions: Apply over Kerlix as directed. Wound #7 - Lower Leg Wound Laterality: Left, Anterior Cleanser: Soap and Water 1 x Per Week/7 Days Discharge Instructions: May shower and wash wound with dial antibacterial soap and water prior to dressing change. Peri-Wound Care: Triamcinolone 15 (g) 1 x Per Week/7 Days Discharge Instructions: Use triamcinolone 15 (g) as directed Peri-Wound Care: Sween Lotion (Moisturizing lotion) 1 x Per Week/7 Days Discharge Instructions: Apply moisturizing lotion as directed Prim Dressing: KerraCel Ag Gelling Fiber Dressing, 2x2 in (silver alginate) 1 x Per Week/7 Days ary Discharge Instructions: Apply silver alginate to wound bed as instructed Secondary Dressing: Woven Gauze Sponge, Non-Sterile 4x4 in 1 x Per Week/7 Days Discharge Instructions: Apply over primary dressing as directed. Secondary Dressing: ABD Pad, 8x10 1 x Per Week/7 Days Discharge Instructions: Apply over primary dressing as directed. Compression Wrap: Kerlix Roll 4.5x3.1 (in/yd) 1 x Per Week/7 Days Discharge Instructions: Apply Kerlix and Coban compression as directed. Compression Wrap: Coban Self-Adherent Wrap 4x5 (in/yd) 1 x Per Week/7 Days Discharge Instructions: Apply over Kerlix as directed. Electronic Signature(s) Signed: 06/20/2020 3:01:45 PM By: Kalman Shan DO Entered By: Kalman Shan on 06/20/2020 14:48:02 -------------------------------------------------------------------------------- Problem List Details Patient  Name: Date of Service: [name], [place] 06/20/2020 2:00 PM Medical Record Number: [id number] Patient Account Number: [id number] Date of Birth/Sex: Treating RN: 08/05/1925 (87 y.o. [name] Primary Care Provider: [name] Other Clinician: Referring Provider: Treating Provider/Extender: [name] in Treatment: 1 Active Problems ICD-10 Encounter Code Description Active Date MDM Code Description Active Date MDM Diagnosis C44.92 Squamous cell carcinoma of skin, unspecified 06/11/2020 No Yes L97.819 Non-pressure chronic ulcer of other part of right lower leg with unspecified 06/11/2020 No Yes severity L97.829 Non-pressure chronic ulcer of other part of left lower leg with unspecified 06/11/2020 No  Yes severity I87.2 Venous insufficiency (chronic) (peripheral) 06/11/2020 No Yes Inactive Problems Resolved Problems Electronic Signature(s) Signed: 06/20/2020 3:01:45 PM By: Kalman Shan DO Entered By: Kalman Shan on 06/20/2020 14:41:23 -------------------------------------------------------------------------------- Progress Note Details Patient Name: Date of Service: [name], [name] 06/20/2020 2:00 PM Medical Record Number: [id number] Patient Account Number: [id number] Date of Birth/Sex: Treating RN: 1925-04-23 (87 y.o. Elam Dutch Primary Care Provider: [name] Other Clinician: Referring Provider: Treating Provider/Extender: [name] in Treatment: 1 Subjective Chief Complaint Information obtained from Patient Bilateral lower extremity wounds that have been biopsied and positive for squamous cell carcinoma History of Present Illness (HPI) The following HPI elements were documented for the patient's wound: Location: left leg Admission 5/23 Ms. 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 She states that the wound on the right has been present for a year. The 1 on the left has opened up 1 month ago. She is followed with oncology for this issue as she had biopsies that showed squamous cell carcinoma. She is also seeing radiation oncology for treatment options. She presents today because she would like for her wounds to be healed by [place]. She currently denies signs of infection. 6/1; patient presents for 1 week follow-up. She states she has tolerated the leg wraps well. She states these do not bother her and is happy to continue with them. She is scheduled to see her oncologist today to go over treatment options for the bilateral lower extremity squamous cell carcinoma. Radiation is currently not a recommended option. Patient states she overall feels well. Patient History Information obtained from Patient. Family History Unknown History. Social History Never smoker, Marital Status - Single, Alcohol Use - Never, Drug Use - No History, Caffeine Use - Never. Medical History Eyes Denies history of Cataracts, Optic Neuritis Ear/Nose/Mouth/Throat Denies history of Chronic sinus problems/congestion, Middle ear problems Hematologic/Lymphatic Patient has history of Anemia Denies history of Hemophilia, Human Immunodeficiency Virus, Lymphedema, Sickle Cell Disease Respiratory Denies history of Aspiration, Asthma, Chronic Obstructive Pulmonary Disease (COPD), Pneumothorax, Sleep Apnea, Tuberculosis Cardiovascular Patient has history of Arrhythmia - Atrial Flutter, A fibb, Congestive Heart Failure, Hypertension Denies history of Angina, Coronary Artery Disease, Deep Vein Thrombosis, Hypotension, Myocardial Infarction, Peripheral Arterial Disease, Peripheral Venous Disease, Phlebitis, Vasculitis Gastrointestinal Patient has history of  Colitis Denies history of Cirrhosis , Crohnoos, Hepatitis A, Hepatitis B, Hepatitis C Endocrine Denies history of Type I Diabetes, Type II Diabetes Genitourinary Denies history of End Stage Renal Disease Immunological Denies history of Lupus Erythematosus, Raynaudoos, Scleroderma Integumentary (Skin) Denies history of History of Burn Musculoskeletal Patient has history of Osteoarthritis Denies history of Gout, Rheumatoid Arthritis, Osteomyelitis Neurologic Denies history of Dementia, Neuropathy, Quadriplegia, Paraplegia, Seizure Disorder Oncologic Denies history of Received Chemotherapy, Received Radiation Hospitalization/Surgery History - removal of rod left leg. 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 Vitals Time Taken: 1:40 PM, Height: 65 in, Weight: 163 lbs, BMI: 27.1, Temperature: 97.4 F, Pulse: 69 bpm, Respiratory Rate: 16 breaths/min, Blood Pressure: 125/73 mmHg. Cardiovascular 2+ dorsalis pedis/posterior tibialis pulses. Psychiatric pleasant and cooperative. General Notes: Bilateral lesions to the anterior shins with discoloration and irregular borders, no drainage noted.  No signs of infection. Integumentary (Hair, Skin) Wound #6 status is Open. Original cause of wound was Blister. The date acquired was: 04/20/2020. The wound has been in treatment 1 weeks. The wound is located on the Right,Anterior Lower Leg. The wound measures 4cm length x 3cm width x 0.2cm depth; 9.425cm^2 area and 1.885cm^3 volume. There is Fat Layer (Subcutaneous Tissue) exposed. There is no tunneling or undermining noted. There is a medium amount of serosanguineous drainage noted. The wound margin is distinct with the outline attached to the wound base. There is small (1-33%)  red, pink granulation within the wound bed. There is a large (67-100%) amount of necrotic tissue within the wound bed including Adherent Slough. Wound #7 status is Open. Original cause of wound was Blister. The date acquired was: 04/20/2020. The wound has been in treatment 1 weeks. The wound is located on the Left,Anterior Lower Leg. The wound measures 2.5cm length x 2cm width x 0.1cm depth; 3.927cm^2 area and 0.393cm^3 volume. There is Fat Layer (Subcutaneous Tissue) exposed. There is no tunneling or undermining noted. There is a medium amount of serosanguineous drainage noted. The wound margin is distinct with the outline attached to the wound base. There is small (1-33%) red granulation within the wound bed. There is a large (67-100%) amount of necrotic tissue within the wound bed including Adherent Slough. Assessment Active Problems ICD-10 Squamous cell carcinoma of skin, unspecified Non-pressure chronic ulcer of other part of right lower leg with unspecified severity Non-pressure chronic ulcer of other part of left lower leg with unspecified severity Venous insufficiency (chronic) (peripheral) Overall lesions are stable. Patient has an appointment with Dr. Jana Hakim today to discuss options for treatment of bilateral squamous cell carcinoma of lower extremities. Patient had ABIs that were normal. We will continue with silver collagen to the lower extremities bilaterally with Kerlix/Coban. We will try and obtain home health as she just needs The wraps changed weekly and can follow-up with [place] once monthly. By the time this will get set up she can have nurse visits weekly here and I will see her in 3 weeks. Plan Follow-up Appointments: Return Appointment in: - 07/11/20 with Dr. Heber [place] Nurse Visit: - 06/27/20 and 07/04/20 (if Home health becomes established many cancel nurse visits) Bathing/ Shower/ Hygiene: May shower with protection but do not get wound dressing(s) wet. - Use cast  protector Edema Control - Lymphedema / SCD / Other: Elevate legs to the level of the heart or above for 30 minutes daily and/or when sitting, a frequency of: - throughout the day Avoid standing for long periods of time. Exercise regularly Additional Orders / Instructions: Follow Nutritious Diet Home Health: Admit to Home Health for wound care. May utilize formulary equivalent dressing for wound treatment orders unless otherwise specified. - Referral for Home Health sent for skilled nursing 1-2 wk for dressing/compression wrap change. WOUND #6: - Lower Leg Wound Laterality: Right, Anterior Cleanser: Soap and Water 1 x Per Week/7 Days Discharge Instructions: May shower and wash wound with dial antibacterial soap and water prior to dressing change. Peri-Wound Care: Triamcinolone 15 (g) 1 x Per Week/7 Days Discharge Instructions: Use triamcinolone 15 (g) as directed Peri-Wound Care: Sween Lotion (Moisturizing lotion) 1 x Per Week/7 Days Discharge Instructions: Apply moisturizing lotion as directed Prim Dressing: KerraCel Ag Gelling Fiber Dressing, 2x2 in (silver alginate) 1 x Per Week/7 Days ary Discharge Instructions: Apply silver alginate to wound bed as instructed Secondary Dressing: Woven Gauze Sponge, Non-Sterile 4x4 in 1 x Per Week/7 Days  Discharge Instructions: Apply over primary dressing as directed. Secondary Dressing: ABD Pad, 8x10 1 x Per Week/7 Days Discharge Instructions: Apply over primary dressing as directed. Com pression Wrap: Kerlix Roll 4.5x3.1 (in/yd) 1 x Per Week/7 Days Discharge Instructions: Apply Kerlix and Coban compression as directed. Com pression Wrap: Coban Self-Adherent Wrap 4x5 (in/yd) 1 x Per Week/7 Days Discharge Instructions: Apply over Kerlix as directed. WOUND #7: - Lower Leg Wound Laterality: Left, Anterior Cleanser: Soap and Water 1 x Per Week/7 Days Discharge Instructions: May shower and wash wound with dial antibacterial soap and water prior to  dressing change. Peri-Wound Care: Triamcinolone 15 (g) 1 x Per Week/7 Days Discharge Instructions: Use triamcinolone 15 (g) as directed Peri-Wound Care: Sween Lotion (Moisturizing lotion) 1 x Per Week/7 Days Discharge Instructions: Apply moisturizing lotion as directed Prim Dressing: KerraCel Ag Gelling Fiber Dressing, 2x2 in (silver alginate) 1 x Per Week/7 Days ary Discharge Instructions: Apply silver alginate to wound bed as instructed Secondary Dressing: Woven Gauze Sponge, Non-Sterile 4x4 in 1 x Per Week/7 Days Discharge Instructions: Apply over primary dressing as directed. Secondary Dressing: ABD Pad, 8x10 1 x Per Week/7 Days Discharge Instructions: Apply over primary dressing as directed. Com pression Wrap: Kerlix Roll 4.5x3.1 (in/yd) 1 x Per Week/7 Days Discharge Instructions: Apply Kerlix and Coban compression as directed. Com pression Wrap: Coban Self-Adherent Wrap 4x5 (in/yd) 1 x Per Week/7 Days Discharge Instructions: Apply over Kerlix as directed. 1. Silver collagen with bilateral Kerlix/Coban wraps 2. Follow-up with me in 3 weeks and weekly with nurse visits. 3. Try and establish Home health Electronic Signature(s) Signed: 06/20/2020 3:01:45 PM By: Kalman Shan DO Entered By: Kalman Shan on 06/20/2020 14:59:20 -------------------------------------------------------------------------------- HxROS Details Patient Name: Date of Service: [name], [name] 06/20/2020 2:00 PM Medical Record Number: [id number] Patient Account Number: [id number] Date of Birth/Sex: Treating RN: 1925-03-25 (87 y.o. Elam Dutch Primary Care Provider: [name] Other Clinician: Referring Provider: Treating Provider/Extender: [name] in Treatment: 1 Information Obtained From Patient Eyes Medical History: Negative for: Cataracts; Optic Neuritis Ear/Nose/Mouth/Throat Medical History: Negative for: Chronic sinus problems/congestion;  Middle ear problems Hematologic/Lymphatic Medical History: Positive for: Anemia Negative for: Hemophilia; Human Immunodeficiency Virus; Lymphedema; Sickle Cell Disease Respiratory Medical History: Negative for: Aspiration; Asthma; Chronic Obstructive Pulmonary Disease (COPD); Pneumothorax; Sleep Apnea; Tuberculosis Cardiovascular Medical History: Positive for: Arrhythmia - Atrial Flutter, A fibb; Congestive Heart Failure; Hypertension Negative for: Angina; Coronary Artery Disease; Deep Vein Thrombosis; Hypotension; Myocardial Infarction; Peripheral Arterial Disease; Peripheral Venous Disease; Phlebitis; Vasculitis Past Medical History Notes: hyperlipidemia Gastrointestinal Medical History: Positive for: Colitis Negative for: Cirrhosis ; Crohns; Hepatitis A; Hepatitis B; Hepatitis C Endocrine Medical History: Negative for: Type I Diabetes; Type II Diabetes Past Medical History Notes: hypothyroidism Genitourinary Medical History: Negative for: End Stage Renal Disease Immunological Medical History: Negative for: Lupus Erythematosus; Raynauds; Scleroderma Integumentary (Skin) Medical History: Negative for: History of Burn Musculoskeletal Medical History: Positive for: Osteoarthritis Negative for: Gout; Rheumatoid Arthritis; Osteomyelitis Neurologic Medical History: Negative for: Dementia; Neuropathy; Quadriplegia; Paraplegia; Seizure Disorder Past Medical History Notes: lumbar spindylolysis Oncologic Medical History: Negative for: Received Chemotherapy; Received Radiation Past Medical History Notes: BLE squamous ceel carcionoma Immunizations Pneumococcal Vaccine: Received Pneumococcal Vaccination: Yes Implantable Devices None Hospitalization / Surgery History Type of Hospitalization/Surgery removal of rod left leg Family and Social History Unknown History: Yes; Never smoker; Marital Status - Single; Alcohol Use: Never; Drug Use: No History; Caffeine Use: Never;  Financial Concerns: No; Food, Clothing or Shelter Needs: No;  Support System Lacking: No; Transportation Concerns: No Electronic Signature(s) Signed: 06/20/2020 3:01:45 PM By: Kalman Shan DO Signed: 06/20/2020 6:06:42 PM By: Baruch Gouty RN, BSN Entered By: Kalman Shan on 06/20/2020 14:47:10 -------------------------------------------------------------------------------- SuperBill Details Patient Name: Date of Service: [name], [name] 06/20/2020 Medical Record Number: [id number] Patient Account Number: [id number] Date of Birth/Sex: Treating RN: 02-12-1925 (87 y.o. [name] Primary Care Provider: [name] Other Clinician: Referring Provider: Treating Provider/Extender: [name] in Treatment: 1 Diagnosis Coding ICD-10 Codes Code Description C44.92 Squamous cell carcinoma of skin, unspecified L97.819 Non-pressure chronic ulcer of other part of right lower leg with unspecified severity L97.829 Non-pressure chronic ulcer of other part of left lower leg with unspecified severity I87.2 Venous insufficiency (chronic) (peripheral) Facility Procedures CPT4 Code: 92010071 Description: 21975 - WOUND CARE VISIT-LEV 4 EST PT Modifier: Quantity: 1 Physician Procedures Electronic Signature(s) Signed: 06/20/2020 3:01:45 PM By: Kalman Shan DO Entered By: Kalman Shan on 06/20/2020 14:59:59

## 2020-06-20 NOTE — Progress Notes (Addendum)
[place]  [phone] [phone]     ID: [name] DOB: Dec 18, 1925  MR#: [id number]  OMV#:[id number]  Patient Care Team: [name], MD as PCP - General Angelena Form Annita Brod, MD as PCP - Cardiology (Cardiology) Rolm Bookbinder, MD as Consulting Physician (General Surgery) Taijuan Serviss, Virgie Dad, MD as Consulting Physician (Oncology) Burnell Blanks, MD as Consulting Physician (Cardiology) Jarome Matin, MD as Consulting Physician (Dermatology) Marlou Sa Tonna Corner, MD as Consulting Physician (Orthopedic Surgery) Cameron Sprang, MD as Consulting Physician (Neurology) OTHER MD:  CHIEF COMPLAINT: Estrogen receptor positive invasive breast cancer  CURRENT TREATMENT: cemiplimab/ Libtayo   INTERVAL HISTORY: [name] is seen today for follow-up of her new skin cancer. She is now under observation for her breast cancer.  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  I have discussed this with the radiation oncologist (Dr. Isidore Moos).  She thinks that while radiation may be able to sterilize the area it would probably leave bare bone at the base.  She recommends against it.  Accordingly we are going to try systemic therapy with cemiplimab. I have scheduled her to start on 07/04/2020 which is the first day I am back in clinic.  The last mammogram we have records for is from July 2019 at [facility] showing a density category A.  [name] tells me there has been too much on her plate and she has neglected her health to some extent.   REVIEW OF SYSTEMS: [name] is very aware of the problem in her lower legs.  She is going to the wound center where she was indeed today and they are doing the best they can to keep it clean and remove dead tissue.  They noted a little bit of less swelling and little bit of better blood flow but they understand and have explained to her that the wound will  not heal and indeed will get worse unless the cancer causing the wound is taking care of.  Granddaughter [name] forwarded photos of the way the wounds look today and we will try to get them from my chart into epic.   COVID 19 VACCINATION STATUS: fully vaccinated [employer]), with booster 10/2019   BREAST CANCER HISTORY: From the original intake note:  [name] noted a change in her left breast and had bilateral screening mammography at [facility] 06/07/2015, with tomosynthesis. Breast density was category A. There was a new oval mass in the left breast central to the nipple. Left breast ultrasound the same day confirmed a 3.4 cm oval mass in the left breast upper outer quadrant, correlating with the mammography. The left axilla was sonographically benign.  Biopsy of the left breast mass in question 06/11/2015 showed ([id number]) a papillary lesion, consistent with an intraductal papilloma, but due to fragmentation the edges of the lesion could not be assessed.  Accordingly the patient was referred to surgery, and after appropriate discussion underwent left lumpectomy 07/25/2015. The final pathology ([id number]) showed 2 foci of invasive ductal carcinoma, grade 2, measuring 3.0 cm, and grade 1, measuring 0.9 cm. There was also ductal carcinoma in situ. The invasive tumor was present at the superior margin, and ductal carcinoma in situ was focally less than 0.1 cm to the same margin. There was evidence of lymphovascular invasion. The prognostic panel showed estrogen receptor 100% positive, progesterone receptor 100% positive, both with strong staining intensity, for both lesions. HER-2 was also negative for both lesions, the signals  ratio being 1.45-1.65, and the number per cell 2.10-2.80.  The patient's subsequent history is as detailed below   PAST MEDICAL HISTORY: Past Medical History:  Diagnosis Date  . Anxiety    denies  . Atrial flutter ([place])   . Bilateral edema of lower extremity   . Breast cancer  ([place])    left breast  . Chronic constipation   . Chronic diastolic CHF (congestive heart failure) ([place])   . Diverticulosis of colon   . GERD (gastroesophageal reflux disease)   . H/O hiatal hernia   . History of cellulitis    LEFT LOWER LEG --  SEPT 2015  . History of colitis    DX  2003  WITH ISCHEMIC COLITIS  . History of diverticulitis of colon    perferated diverticulitis w/ abscess 08-25-2013 drain placed --  resolved without surgical intervention  . History of GI bleed    2011--- UPPER SECONARY GASTRIC ULCER FROM NSAID USE  . History of Helicobacter pylori infection    2011  . History of ulcer of lower limb    2012   RIGHT LOWER EXTREMITIY--  STATSIS ULCER  . Hyperlipidemia   . Hypertension   . Hypothyroidism   . Iron deficiency anemia 2011   elevated MCV  . LBP (low back pain)    severe lumbar spondylosis s/p L3/L4,L4/L5 fusion 12/10  . Lumbar spondylolysis   . OA (osteoarthritis)    "hands" (08/25/2013)  . Open wound of left lower leg   . Osteopenia   . Persistent atrial fibrillation ([place])    CARDIOLOGIST-   DR MCALANY  . RBBB   . Urinary frequency     PAST SURGICAL HISTORY: Past Surgical History:  Procedure Laterality Date  . APPLICATION OF A-CELL OF EXTREMITY Left 06/21/2013   Procedure: APPLICATION OF A-CELL OF EXTREMITY;  Surgeon: Theodoro Kos, DO;  Location: [place];  Service: Plastics;  Laterality: Left;  . APPLICATION OF A-CELL OF EXTREMITY Left 08/22/2013   Procedure: APPLICATION OF A-CELL/VAC TO LOWER LEFT LEG WOUND;  Surgeon: Theodoro Kos, DO;  Location: [place];  Service: Plastics;  Laterality: Left;  . APPLICATION OF A-CELL OF EXTREMITY Left 02/06/2014   Procedure: WITH PLACEMENT OF A -CELL ;  Surgeon: Theodoro Kos, DO;  Location: [place];  Service: Plastics;  Laterality: Left;  . APPLICATION OF A-CELL OF EXTREMITY Left 08/09/2014   Procedure: PLACEMENT OF A CELL;  Surgeon: Theodoro Kos, DO;  Location: [place];  Service:  Plastics;  Laterality: Left;  . APPLICATION OF WOUND VAC Left 06/16/2013   Procedure: APPLICATION OF WOUND VAC;  Surgeon: Meredith Pel, MD;  Location: [place];  Service: Orthopedics;  Laterality: Left;  . APPLICATION OF WOUND VAC Left 06/21/2013   Procedure: APPLICATION OF WOUND VAC;  Surgeon: Theodoro Kos, DO;  Location: [place];  Service: Plastics;  Laterality: Left;  . BIOPSY BREAST  07/25/2015   LEFT RADIOACTIVE SEED GUIDED EXCISIONAL BREAST BIOPSY (Left) as a surgical intervention  . EXTERNAL FIXATION LEG Left 06/16/2013   Procedure: EXTERNAL FIXATION LEG;  Surgeon: Meredith Pel, MD;  Location: [place];  Service: Orthopedics;  Laterality: Left;  . EXTERNAL FIXATION REMOVAL Left 06/21/2013   Procedure: REMOVAL EXTERNAL FIXATION LEG;  Surgeon: Meredith Pel, MD;  Location: [place];  Service: Orthopedics;  Laterality: Left;  . EYE SURGERY Bilateral    cataract removal  . HARDWARE REMOVAL Left 05/23/2014   Procedure: HARDWARE REMOVAL;  Surgeon: Meredith Pel, MD;  Location: [place];  Service: Orthopedics;  Laterality: Left;  REMOVAL OF HARDWARE LEFT TIBIA  . I & D EXTREMITY Left 06/16/2013   Procedure: IRRIGATION AND DEBRIDEMENT EXTREMITY;  Surgeon: Meredith Pel, MD;  Location: [place];  Service: Orthopedics;  Laterality: Left;  . I & D EXTREMITY Left 02/06/2014   Procedure: IRRIGATION AND DEBRIDEMENT LEFT LEG WOUND ;  Surgeon: Theodoro Kos, DO;  Location: [place];  Service: Plastics;  Laterality: Left;  . I & D EXTREMITY Left 08/09/2014   Procedure: IRRIGATION AND DEBRIDEMENT  OF LEFT LOWER LEG;  Surgeon: Theodoro Kos, DO;  Location: [place];  Service: Plastics;  Laterality: Left;  . LUMBAR FUSION  01-02-2009   L3-L5  . RADIOACTIVE SEED GUIDED EXCISIONAL BREAST BIOPSY Left 07/25/2015   Procedure: LEFT RADIOACTIVE SEED GUIDED EXCISIONAL BREAST BIOPSY;  Surgeon: Rolm Bookbinder, MD;  Location: [place];  Service: General;  Laterality: Left;  . RE-EXCISION OF BREAST  LUMPECTOMY Left 09/05/2015   Procedure: RE-EXCISION OF LEFT BREAST LUMPECTOMY;  Surgeon: Rolm Bookbinder, MD;  Location: [place];  Service: General;  Laterality: Left;  . SHOULDER OPEN ROTATOR CUFF REPAIR Left 1997  . TIBIA IM NAIL INSERTION Left 06/21/2013   Procedure: INTRAMEDULLARY (IM) NAIL TIBIAL;  Surgeon: Meredith Pel, MD;  Location: [place];  Service: Orthopedics;  Laterality: Left;  . TONSILLECTOMY  AS CHILD  . TOTAL HIP ARTHROPLASTY Right 06-07-2010  . TRANSTHORACIC ECHOCARDIOGRAM  09-11-2010   DR MCALHANY   LVSF  55-60%/  MILD MV CALCIFICATION WITH NO STENOSIS/  MILD MR & TR / MODERATE LAE/  MODERATE TO SEVERE RAE  . UPPER GI ENDOSCOPY  03-29-2010    FAMILY HISTORY Family History  Problem Relation Age of Onset  . Cancer Father   . Hypertension Other   The patient's father died at the age of 91 from heart problems. The patient's mother died at the age of 32 with a ruptured aneurysm. The patient had no brothers, 1 sister. There is no history of breast or ovarian cancer in the family.    GYNECOLOGIC HISTORY:  No LMP recorded. Patient is postmenopausal. Menarche age 14, first live birth age 89. The patient is GX P2. She stopped having periods in her early 43s. She did not take hormone replacement. She used oral contraceptives remotely for approximately 2 years, with no complications.   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 Her husband Ernie Hew was a Airline pilot.  According to the patient there are significant cognitive limitations on his side.  They currently live at [facility]. Daughter [name] lives in [place], near [address]. With her husband Jenny Reichmann. Daughter [name] lives in [place]. The patient has 2 grandchildren and 4 great-grandchildren. She is a Psychologist, forensic.   ADVANCED DIRECTIVES: The patient has a living will in place and she has named her granddaughter [name] as her healthcare part of attorney. [name] can be reached at  [place]: Social History   Tobacco Use  . Smoking status: Never Smoker  . Smokeless tobacco: Never Used  Vaping Use  . Vaping Use: Never used  Substance Use Topics  . Alcohol use: No    Comment: 1 glass occ wine 08/03/14- none in a year  . Drug use: No     Colonoscopy: Laurence Spates, remote  PAP:  Bone density:   Allergies  Allergen Reactions  . Zosyn [Piperacillin Sod-Tazobactam So] Hives, Itching, Rash and Other (See  Comments)    Morbilliform eruptions with itching- looks like Measles  . Atorvastatin     myalgias  . Atenolol Nausea And Vomiting  . Clarithromycin Nausea And Vomiting  . Codeine Sulfate Nausea Only  . Levaquin [Levofloxacin] Nausea Only  . Macrodantin Other (See Comments)    UNSPECIFIED   . Oxycodone-Acetaminophen Nausea And Vomiting    Current Outpatient Medications  Medication Sig Dispense Refill  . acetaminophen (TYLENOL) 500 MG tablet Take 500 mg by mouth daily as needed for mild pain.     [name] apixaban (ELIQUIS) 5 MG TABS tablet Take 1 tablet (5 mg total) by mouth 2 (two) times daily. 180 tablet 3  . B Complex-C (B-COMPLEX WITH VITAMIN C) tablet Take 1 tablet by mouth daily.    . busPIRone (BUSPAR) 15 MG tablet Take 1 tablet (15 mg total) by mouth 2 (two) times daily. 180 tablet 11  . Cholecalciferol (VITAMIN D3) 1000 UNITS tablet Take 1,000 Units by mouth daily.    [name] diltiazem (CARDIZEM CD) 120 MG 24 hr capsule TAKE 1 CAPSULE (120 MG TOTAL) BY MOUTH DAILY. 90 capsule 3  . doxycycline (VIBRA-TABS) 100 MG tablet Take 1 tablet (100 mg total) by mouth daily. 90 tablet 3  . famotidine (PEPCID) 20 MG tablet Take 20 mg by mouth daily.    [name] gabapentin (NEURONTIN) 100 MG capsule Take 1 capsule (100 mg total) by mouth at bedtime. Pain 90 capsule 3  . levothyroxine (SYNTHROID) 25 MCG tablet TAKE 1 TABLET BY MOUTH DAILY FOR THYROID. 30 tablet 11  . linaclotide (LINZESS) 290 MCG CAPS capsule Take 1 capsule (290 mcg total) by mouth daily.  30 capsule 11  . metoprolol succinate (TOPROL-XL) 50 MG 24 hr tablet Take 0.5 tablets (25 mg total) by mouth 2 (two) times daily. Take with or immediately following a meal. 180 tablet 3  . Multiple Vitamins-Minerals (MULTIVITAMIN WITH MINERALS) tablet Take 1 tablet by mouth daily.    . polyethylene glycol (MIRALAX) 17 g packet Take 17 g by mouth daily. 30 each 0  . temazepam (RESTORIL) 15 MG capsule TAKE 1 CAPSULE BY MOUTH EVERY NIGHT AT BEDTIME AS NEEDED FOR SLEEP 60 capsule 3   No current facility-administered medications for this visit.    OBJECTIVE: White Bell who appears stated age  33:   06/20/20 1510  BP: (!) 136/59  Pulse: 64  Resp: 18  Temp: 97.7 F (36.5 C)  SpO2: 97%     Body mass index is 26.09 kg/m.     ECOG FS:1 - Symptomatic but completely ambulatory  Sclerae unicteric, EOMs intact Wearing a mask Lungs no rales or rhonchi Heart regular rate and rhythm Abd soft, nontender, positive bowel sounds MSK lower extremities are carefully wrapped with netting over the wrapping Neuro: nonfocal, well oriented, positive affect Breasts: Deferred  Please refer to the patient's "my chart" for this date (06/20/2020) for an image of the lower extremities as a look today.  LAB RESULTS:  CMP     Component Value Date/Time   NA 140 06/04/2020 1138   NA 134 08/11/2018 1425   NA 137 07/28/2016 1334   K 3.6 06/04/2020 1138   K 3.8 07/28/2016 1334   CL 102 06/04/2020 1138   CO2 30 06/04/2020 1138   CO2 28 07/28/2016 1334   GLUCOSE 103 (H) 06/04/2020 1138   GLUCOSE 160 (H) 07/28/2016 1334   BUN 17 06/04/2020 1138   BUN 20 08/11/2018 1425   BUN 9.5 07/28/2016 1334   CREATININE  1.03 (H) 06/04/2020 1138   CREATININE 1.0 07/28/2016 1334   CALCIUM 9.6 06/04/2020 1138   CALCIUM 9.6 07/28/2016 1334   PROT 7.2 06/04/2020 1138   PROT 7.4 07/28/2016 1334   ALBUMIN 3.6 06/04/2020 1138   ALBUMIN 4.0 07/28/2016 1334   AST 20 06/04/2020 1138   AST 21 07/28/2016 1334   ALT 10  06/04/2020 1138   ALT 24 07/28/2016 1334   ALKPHOS 76 06/04/2020 1138   ALKPHOS 96 07/28/2016 1334   BILITOT 1.0 06/04/2020 1138   BILITOT 1.21 (H) 07/28/2016 1334   GFRNONAA 51 (L) 06/04/2020 1138   GFRAA 50 (L) 10/03/2019 1355    INo results found for: SPEP, UPEP  Lab Results  Component Value Date   WBC 5.7 06/04/2020   NEUTROABS 3.3 06/04/2020   HGB 12.1 06/04/2020   HCT 37.6 06/04/2020   MCV 106.5 (H) 06/04/2020   PLT 197 06/04/2020      Chemistry      Component Value Date/Time   NA 140 06/04/2020 1138   NA 134 08/11/2018 1425   NA 137 07/28/2016 1334   K 3.6 06/04/2020 1138   K 3.8 07/28/2016 1334   CL 102 06/04/2020 1138   CO2 30 06/04/2020 1138   CO2 28 07/28/2016 1334   BUN 17 06/04/2020 1138   BUN 20 08/11/2018 1425   BUN 9.5 07/28/2016 1334   CREATININE 1.03 (H) 06/04/2020 1138   CREATININE 1.0 07/28/2016 1334      Component Value Date/Time   CALCIUM 9.6 06/04/2020 1138   CALCIUM 9.6 07/28/2016 1334   ALKPHOS 76 06/04/2020 1138   ALKPHOS 96 07/28/2016 1334   AST 20 06/04/2020 1138   AST 21 07/28/2016 1334   ALT 10 06/04/2020 1138   ALT 24 07/28/2016 1334   BILITOT 1.0 06/04/2020 1138   BILITOT 1.21 (H) 07/28/2016 1334       No results found for: LABCA2  No components found for: LABCA125  No results for input(s): INR in the last 168 hours.  Urinalysis    Component Value Date/Time   COLORURINE YELLOW 10/07/2018 1535   APPEARANCEUR CLEAR 10/07/2018 1535   LABSPEC 1.012 10/07/2018 1535   PHURINE 6.0 10/07/2018 1535   GLUCOSEU NEGATIVE 10/07/2018 1535   GLUCOSEU NEGATIVE 01/17/2015 1415   HGBUR NEGATIVE 10/07/2018 1535   [place] 10/07/2018 1535   [place] 10/07/2018 1535   PROTEINUR 30 (A) 10/07/2018 1535   UROBILINOGEN 0.2 01/17/2015 1415   NITRITE NEGATIVE 10/07/2018 1535   LEUKOCYTESUR NEGATIVE 10/07/2018 1535    STUDIES: VAS [place] ABI WITH/WO TBI  Result Date: 06/14/2020  LOWER EXTREMITY DOPPLER STUDY  Patient Name:  [name]  Date of Exam:   06/14/2020 Medical Rec #: [id number]      Accession #:    [id number] Date of Birth: Dec 08, 1925     Patient Gender: F Patient Age:   89Y Exam Location:  [facility] Procedure:      VAS [place] ABI WITH/WO TBI Referring Phys: [id number] JESSICA RATLIFF HOFFMAN --------------------------------------------------------------------------------  Indications: Ulceration. High Risk Factors: Hypertension, no history of smoking.  Vascular Interventions: Chronic venous insufficiency. Limitations: Today's exam was limited due to bandages and an open wound. Performing Technologist: Delorise Shiner RVT  Examination Guidelines: A complete evaluation includes at minimum, Doppler waveform signals and systolic blood pressure reading at the level of bilateral brachial, anterior tibial, and posterior tibial arteries, when vessel segments are accessible. Bilateral testing is considered an integral part of a complete  examination. Photoelectric Plethysmograph (PPG) waveforms and toe systolic pressure readings are included as required and additional duplex testing as needed. Limited examinations for reoccurring indications may be performed as noted.  ABI Findings: +---------+------------------+-----+---------+--------+ Right    Rt Pressure (mmHg)IndexWaveform Comment  +---------+------------------+-----+---------+--------+ Brachial 165                                      +---------+------------------+-----+---------+--------+ ATA      215               1.30                   +---------+------------------+-----+---------+--------+ PTA      191               1.16 triphasic         +---------+------------------+-----+---------+--------+ DP                              biphasic          +---------+------------------+-----+---------+--------+ Great Toe135               0.82                   +---------+------------------+-----+---------+--------+  +---------+------------------+-----+----------+-------+ Left     Lt Pressure (mmHg)IndexWaveform  Comment +---------+------------------+-----+----------+-------+ Brachial 164                                      +---------+------------------+-----+----------+-------+ ATA      187               1.13                   +---------+------------------+-----+----------+-------+ PTA      204               1.24 triphasic         +---------+------------------+-----+----------+-------+ DP                              monophasic        +---------+------------------+-----+----------+-------+ Great Toe160               0.97                   +---------+------------------+-----+----------+-------+ +-------+-----------+-----------+------------+------------+ ABI/TBIToday's ABIToday's TBIPrevious ABIPrevious TBI +-------+-----------+-----------+------------+------------+ Right  1.30       0.82                                +-------+-----------+-----------+------------+------------+ Left   1.24       0.97                                +-------+-----------+-----------+------------+------------+   Summary: Right: Resting right ankle-brachial index is within normal range. No evidence of significant right lower extremity arterial disease. The right toe-brachial index is normal. RT great toe pressure = 135 mmHg. Left: Resting left ankle-brachial index is within normal range. No evidence of significant left lower extremity arterial disease. The left toe-brachial index is normal. LT Great toe pressure = 160 mmHg.  *See table(s) above for measurements and observations.  Electronically signed by Deitra [facility] MD on 06/14/2020 at 4:34:25 PM.    Final      ELIGIBLE FOR AVAILABLE RESEARCH PROTOCOL: no  ASSESSMENT: 87 y.o. [name] status post left breast upper outer quadrant biopsy 06/11/2015 for a papillary lesion with unclear margins.  (1) status post left lumpectomy  07/25/2015 for an mpT2 cN0, stage IIA  invasive ductal carcinoma,  grade I-II  estrogen and progesterone receptor strongly positive, HER-2 not amplified   (a) additional surgery 09/05/2015 found some residual ductal carcinoma in situ but the margins were clear.  (2) no sentinel lymph node sampling or adjuvant radiation planned  (3) adjuvant anastrozole started 08/10/2015, completed May 2022  (4) squamous cell carcinomas of the skin in both lower extremities  (a) to start cemiplimab/ Libtayo 07/04/2020, to be repeated every 21 days x 8  (b) consider cetuximab if Libtayo ineffective or not tolerated  PLAN:  Anatalia is pretty much done with her breast cancer diagnosis and I would normally have released her from follow-up here but she now has a new problem which is bilateral lower extremity squamous cell carcinomas.  The cancer could not be completely removed surgically and even though she is going to the wound center and that is clinically improving the problem, the cancer there will continue to eat away the flesh unless it is treated.  As explained above radiation is really not a good option in this case.  Accordingly we are going to try cemiplimab.  Today we discussed the possible toxicities side effects and complications of this agent and I gave him extensive information.  They understand that this "releases" the immune system and if the immune system decides to attack a major organ severe consequences can occur including death.  That is very unusual of course and potentially can be reversed with high doses of steroids if the problem does occur.  More commonly we face issues with diarrhea, rash, and hypothyroidism.  They have all the information on hand that they have pretty much decided that they do want to participate.  I am not going to be available however next week so tentatively we are scheduling [name] to receive her first treatment on 07/04/2020 and I will be available that day in case there are  any complications  If this does not work we have the option of proceeding to cetuximab.  Total encounter time 35 minutes.*   Pratik Dalziel, Virgie Dad, MD  06/20/20 4:02 PM Medical Oncology and Hematology [facility] [place], [place] [address] Tel. [phone]    Fax. [phone]   I, Wilburn Mylar, am acting as scribe for Dr. Virgie Dad. Nickolis Diel.  I, Lurline Del MD, have reviewed the above documentation for accuracy and completeness, and I agree with the above.   *Total Encounter Time as defined by the Centers for Medicare and Medicaid Services includes, in addition to the face-to-face time of a patient visit (documented in the note above) non-face-to-face time: obtaining and reviewing outside history, ordering and reviewing medications, tests or procedures, care coordination (communications with other health care professionals or caregivers) and documentation in the medical record.

## 2020-06-21 DIAGNOSIS — M19042 Primary osteoarthritis, left hand: Secondary | ICD-10-CM | POA: Diagnosis not present

## 2020-06-21 DIAGNOSIS — L97822 Non-pressure chronic ulcer of other part of left lower leg with fat layer exposed: Secondary | ICD-10-CM | POA: Diagnosis not present

## 2020-06-21 DIAGNOSIS — M19041 Primary osteoarthritis, right hand: Secondary | ICD-10-CM | POA: Diagnosis not present

## 2020-06-21 DIAGNOSIS — C44722 Squamous cell carcinoma of skin of right lower limb, including hip: Secondary | ICD-10-CM | POA: Diagnosis not present

## 2020-06-21 DIAGNOSIS — I4892 Unspecified atrial flutter: Secondary | ICD-10-CM | POA: Diagnosis not present

## 2020-06-21 DIAGNOSIS — I4819 Other persistent atrial fibrillation: Secondary | ICD-10-CM | POA: Diagnosis not present

## 2020-06-21 DIAGNOSIS — E039 Hypothyroidism, unspecified: Secondary | ICD-10-CM | POA: Diagnosis not present

## 2020-06-21 DIAGNOSIS — C44729 Squamous cell carcinoma of skin of left lower limb, including hip: Secondary | ICD-10-CM | POA: Diagnosis not present

## 2020-06-21 DIAGNOSIS — Z981 Arthrodesis status: Secondary | ICD-10-CM | POA: Diagnosis not present

## 2020-06-21 DIAGNOSIS — Z7901 Long term (current) use of anticoagulants: Secondary | ICD-10-CM | POA: Diagnosis not present

## 2020-06-21 DIAGNOSIS — I5032 Chronic diastolic (congestive) heart failure: Secondary | ICD-10-CM | POA: Diagnosis not present

## 2020-06-21 DIAGNOSIS — F419 Anxiety disorder, unspecified: Secondary | ICD-10-CM | POA: Diagnosis not present

## 2020-06-21 DIAGNOSIS — K5730 Diverticulosis of large intestine without perforation or abscess without bleeding: Secondary | ICD-10-CM | POA: Diagnosis not present

## 2020-06-21 DIAGNOSIS — E785 Hyperlipidemia, unspecified: Secondary | ICD-10-CM | POA: Diagnosis not present

## 2020-06-21 DIAGNOSIS — L97812 Non-pressure chronic ulcer of other part of right lower leg with fat layer exposed: Secondary | ICD-10-CM | POA: Diagnosis not present

## 2020-06-21 DIAGNOSIS — I110 Hypertensive heart disease with heart failure: Secondary | ICD-10-CM | POA: Diagnosis not present

## 2020-06-21 DIAGNOSIS — K219 Gastro-esophageal reflux disease without esophagitis: Secondary | ICD-10-CM | POA: Diagnosis not present

## 2020-06-21 DIAGNOSIS — I4510 Unspecified right bundle-branch block: Secondary | ICD-10-CM | POA: Diagnosis not present

## 2020-06-21 DIAGNOSIS — Z853 Personal history of malignant neoplasm of breast: Secondary | ICD-10-CM | POA: Diagnosis not present

## 2020-06-21 DIAGNOSIS — M8580 Other specified disorders of bone density and structure, unspecified site: Secondary | ICD-10-CM | POA: Diagnosis not present

## 2020-06-21 DIAGNOSIS — Z792 Long term (current) use of antibiotics: Secondary | ICD-10-CM | POA: Diagnosis not present

## 2020-06-21 DIAGNOSIS — Z4800 Encounter for change or removal of nonsurgical wound dressing: Secondary | ICD-10-CM | POA: Diagnosis not present

## 2020-06-21 DIAGNOSIS — I872 Venous insufficiency (chronic) (peripheral): Secondary | ICD-10-CM | POA: Diagnosis not present

## 2020-06-21 DIAGNOSIS — M47816 Spondylosis without myelopathy or radiculopathy, lumbar region: Secondary | ICD-10-CM | POA: Diagnosis not present

## 2020-06-21 DIAGNOSIS — Z9089 Acquired absence of other organs: Secondary | ICD-10-CM | POA: Diagnosis not present

## 2020-06-21 NOTE — Progress Notes (Addendum)
[name] ([id number]) , Visit Report for 06/20/2020 Arrival Information Details Patient Name: Date of Service: [name], [name] 06/20/2020 2:00 PM Medical Record Number: [id number] Patient Account Number: [id number] Date of Birth/Sex: Treating RN: 05/10/1925 (87 y.o. [name], [name] Primary Care Mariaha Ellington: [name] Other Clinician: Referring Meili Kleckley: Treating Alexsia Klindt/Extender: [name] in Treatment: 1 Visit Information History Since Last Visit All ordered tests and consults were completed: Yes Patient Arrived: Walker Any new allergies or adverse reactions: No Arrival Time: 13:40 Had a fall or experienced change in No Accompanied By: self activities of daily living that may affect Transfer Assistance: None risk of falls: Patient Identification Verified: Yes Signs or symptoms of abuse/neglect since last visito No Secondary Verification Process Completed: Yes Hospitalized since last visit: No Patient Requires Transmission-Based No Implantable device outside of the clinic excluding No Precautions: cellular tissue based products placed in the center Patient Has Alerts: Yes since last visit: Patient Alerts: R ABI = Non compressible Has Dressing in Place as Prescribed: Yes L ABI = Non compressible Has Compression in Place as Prescribed: Yes Pain Present Now: No Electronic Signature(s) Signed: 06/20/2020 5:52:00 PM By: Deon Pilling Entered By: Deon Pilling on 06/20/2020 13:51:23 -------------------------------------------------------------------------------- Clinic Level of Care Assessment Details Patient Name: Date of Service: [name], [name] 06/20/2020 2:00 PM Medical Record Number: [id number] Patient Account Number: [id number] Date of Birth/Sex: Treating RN: 01-09-1926 (87 y.o. [name] Primary Care Vina Byrd: [name] Other Clinician: Referring Marques Ericson: Treating Kawon Willcutt/Extender: [name] in Treatment: 1 Clinic Level of Care Assessment Items TOOL 4 Quantity Score X- 1 0 Use when only an EandM is performed on FOLLOW-UP visit ASSESSMENTS - Nursing Assessment / Reassessment X- 1 10 Reassessment of Co-morbidities (includes updates in patient status) X- 1 5 Reassessment of Adherence to Treatment Plan ASSESSMENTS - Wound and Skin A ssessment / Reassessment []  - 0 Simple Wound Assessment / Reassessment - one wound X- 2 5 Complex Wound Assessment / Reassessment - multiple wounds []  - 0 Dermatologic / Skin Assessment (not related to wound area) ASSESSMENTS - Focused Assessment []  - 0 Circumferential Edema Measurements - multi extremities []  - 0 Nutritional Assessment / Counseling / Intervention []  - 0 Lower Extremity Assessment (monofilament, tuning fork, pulses) []  - 0 Peripheral Arterial Disease Assessment (using hand held doppler) ASSESSMENTS - Ostomy and/or Continence Assessment and Care []  - 0 Incontinence Assessment and Management []  - 0 Ostomy Care Assessment and Management (repouching, etc.) PROCESS - Coordination of Care []  - 0 Simple Patient / Family Education for ongoing care X- 1 20 Complex (extensive) Patient / Family Education for ongoing care X- 1 10 Staff obtains Programmer, systems, Records, T Results / Process Orders est X- 1 10 Staff telephones HHA, Nursing Homes / Clarify orders / etc []  - 0 Routine Transfer to another Facility (non-emergent condition) []  - 0 Routine Hospital Admission (non-emergent condition) []  - 0 New Admissions / Biomedical engineer / Ordering NPWT Apligraf, etc. , []  - 0 Emergency Hospital Admission (emergent condition) []  - 0 Simple Discharge Coordination []  - 0 Complex (extensive) Discharge Coordination PROCESS - Special Needs []  - 0 Pediatric / Minor Patient Management []  - 0 Isolation Patient Management []  - 0 Hearing / Language / Visual special needs []  - 0 Assessment of  Community assistance (transportation, D/C planning, etc.) []  - 0 Additional assistance / Altered mentation []  - 0 Support Surface(s) Assessment (bed, cushion, seat, etc.) INTERVENTIONS - Wound  Cleansing / Measurement []  - 0 Simple Wound Cleansing - one wound X- 2 5 Complex Wound Cleansing - multiple wounds X- 1 5 Wound Imaging (photographs - any number of wounds) []  - 0 Wound Tracing (instead of photographs) []  - 0 Simple Wound Measurement - one wound X- 2 5 Complex Wound Measurement - multiple wounds INTERVENTIONS - Wound Dressings []  - 0 Small Wound Dressing one or multiple wounds []  - 0 Medium Wound Dressing one or multiple wounds X- 2 20 Large Wound Dressing one or multiple wounds []  - 0 Application of Medications - topical []  - 0 Application of Medications - injection INTERVENTIONS - Miscellaneous []  - 0 External ear exam []  - 0 Specimen Collection (cultures, biopsies, blood, body fluids, etc.) []  - 0 Specimen(s) / Culture(s) sent or taken to Lab for analysis []  - 0 Patient Transfer (multiple staff / Civil Service fast streamer / Similar devices) []  - 0 Simple Staple / Suture removal (25 or less) []  - 0 Complex Staple / Suture removal (26 or more) []  - 0 Hypo / Hyperglycemic Management (close monitor of Blood Glucose) []  - 0 Ankle / Brachial Index (ABI) - do not check if billed separately X- 1 5 Vital Signs Has the patient been seen at the hospital within the last three years: Yes Total Score: 135 Level Of Care: New/Established - Level 4 Electronic Signature(s) Signed: 06/20/2020 5:37:40 PM By: Lorrin Jackson Entered By: Lorrin Jackson on 06/20/2020 14:22:41 -------------------------------------------------------------------------------- Encounter Discharge Information Details Patient Name: Date of Service: [name], [name] 06/20/2020 2:00 PM Medical Record Number: [id number] Patient Account Number: [id number] Date of Birth/Sex: Treating RN: Dec 11, 1925 (87 y.o. [name], [name] Primary Care Phoua Hoadley: [name] Other Clinician: Referring Chandani Rogowski: Treating Alysen Smylie/Extender: [name] in Treatment: 1 Encounter Discharge Information Items Discharge Condition: Stable Ambulatory Status: Ambulatory Discharge Destination: Home Transportation: Private Auto Accompanied By: self Schedule Follow-up Appointment: Yes Clinical Summary of Care: Patient Declined Electronic Signature(s) Signed: 06/21/2020 12:00:56 PM By: Rhae Hammock RN Entered By: Rhae Hammock on 06/20/2020 14:33:05 -------------------------------------------------------------------------------- Lower Extremity Assessment Details Patient Name: Date of Service: [name], [name] 06/20/2020 2:00 PM Medical Record Number: [id number] Patient Account Number: [id number] Date of Birth/Sex: Treating RN: [birth year] (87 y.o. [name] Primary Care Amear Strojny: [name] Other Clinician: Referring Marquise Wicke: Treating Amiley Shishido/Extender: [name] in Treatment: 1 Edema Assessment Assessed: [Left: Yes] [Right: Yes] Edema: [Left: Yes] [Right: Yes] Calf Left: Right: Point of Measurement: From Medial Instep 36 cm 43.3 cm Ankle Left: Right: Point of Measurement: From Medial Instep 22.5 cm 23 cm Vascular Assessment Pulses: Dorsalis Pedis Palpable: [Left:Yes] [Right:Yes] Electronic Signature(s) Signed: 06/20/2020 5:52:00 PM By: Deon Pilling Entered By: Deon Pilling on 06/20/2020 13:52:48 -------------------------------------------------------------------------------- Multi Wound Chart Details Patient Name: Date of Service: [name], [name]. 06/20/2020 2:00 PM Medical Record Number: [id number] Patient Account Number: [id number] Date of Birth/Sex: Treating RN: [birth year] (87 y.o. [name] Primary Care Shawnmichael Parenteau: [name] Other Clinician: Referring Bich Mchaney: Treating  Stokely Jeancharles/Extender: [name] in Treatment: 1 Vital Signs Height(in): 42 Pulse(bpm): 71 Weight(lbs): 163 Blood Pressure(mmHg): 125/73 Body Mass Index(BMI): 27 Temperature(F): 97.4 Respiratory Rate(breaths/min): 16 Photos: [6:No Photos Right, Anterior Lower Leg] [7:No Photos Left, Anterior Lower Leg] [N/A:N/A N/A] Wound Location: [6:Blister] [7:Blister] [N/A:N/A] Wounding Event: [6:Malignant Wound] [7:Malignant Wound] [N/A:N/A] Primary Etiology: [6:Anemia, Arrhythmia, Congestive Heart Anemia, Arrhythmia, Congestive Heart N/A] Comorbid History: [6:Failure, Hypertension, Colitis, Osteoarthritis 04/20/2020] [7:Failure, Hypertension, Colitis, Osteoarthritis 04/20/2020] [N/A:N/A]  Date Acquired: [6:1] [7:1] [N/A:N/A] Weeks of Treatment: [6:Open] [7:Open] [N/A:N/A] Wound Status: [6:4x3x0.2] [7:2.5x2x0.1] [N/A:N/A] Measurements L x W x D (cm) [6:9.425] [7:3.927] [N/A:N/A] A (cm) : rea [6:1.885] [7:0.393] [N/A:N/A] Volume (cm) : [6:-49.10%] [7:-27.90%] [N/A:N/A] % Reduction in Area: [6:0.60%] [7:-28.00%] [N/A:N/A] % Reduction in Volume: [6:Full Thickness Without Exposed] [7:Full Thickness Without Exposed] [N/A:N/A] Classification: [6:Support Structures Medium] [7:Support Structures Medium] [N/A:N/A] Exudate Amount: [6:Serosanguineous] [7:Serosanguineous] [N/A:N/A] Exudate Type: [6:red, brown] [7:red, brown] [N/A:N/A] Exudate Color: [6:Distinct, outline attached] [7:Distinct, outline attached] [N/A:N/A] Wound Margin: [6:Small (1-33%)] [7:Small (1-33%)] [N/A:N/A] Granulation Amount: [6:Red, Pink] [7:Red] [N/A:N/A] Granulation Quality: [6:Large (67-100%)] [7:Large (67-100%)] [N/A:N/A] Necrotic Amount: [6:Fat Layer (Subcutaneous Tissue): Yes Fat Layer (Subcutaneous Tissue): Yes N/A] Exposed Structures: [6:Fascia: No Tendon: No Muscle: No Joint: No Bone: No None] [7:Fascia: No Tendon: No Muscle: No Joint: No Bone: No None] [N/A:N/A] Treatment Notes Wound #6  (Lower Leg) Wound Laterality: Right, Anterior Cleanser Soap and Water Discharge Instruction: May shower and wash wound with dial antibacterial soap and water prior to dressing change. Peri-Wound Care Triamcinolone 15 (g) Discharge Instruction: Use triamcinolone 15 (g) as directed Sween Lotion (Moisturizing lotion) Discharge Instruction: Apply moisturizing lotion as directed Topical Primary Dressing KerraCel Ag Gelling Fiber Dressing, 2x2 in (silver alginate) Discharge Instruction: Apply silver alginate to wound bed as instructed Secondary Dressing Woven Gauze Sponge, Non-Sterile 4x4 in Discharge Instruction: Apply over primary dressing as directed. ABD Pad, 8x10 Discharge Instruction: Apply over primary dressing as directed. Secured With Compression Wrap Kerlix Roll 4.5x3.1 (in/yd) Discharge Instruction: Apply Kerlix and Coban compression as directed. Coban Self-Adherent Wrap 4x5 (in/yd) Discharge Instruction: Apply over Kerlix as directed. Compression Stockings Add-Ons Wound #7 (Lower Leg) Wound Laterality: Left, Anterior Cleanser Soap and Water Discharge Instruction: May shower and wash wound with dial antibacterial soap and water prior to dressing change. Peri-Wound Care Triamcinolone 15 (g) Discharge Instruction: Use triamcinolone 15 (g) as directed Sween Lotion (Moisturizing lotion) Discharge Instruction: Apply moisturizing lotion as directed Topical Primary Dressing KerraCel Ag Gelling Fiber Dressing, 2x2 in (silver alginate) Discharge Instruction: Apply silver alginate to wound bed as instructed Secondary Dressing Woven Gauze Sponge, Non-Sterile 4x4 in Discharge Instruction: Apply over primary dressing as directed. ABD Pad, 8x10 Discharge Instruction: Apply over primary dressing as directed. Secured With Compression Wrap Kerlix Roll 4.5x3.1 (in/yd) Discharge Instruction: Apply Kerlix and Coban compression as directed. Coban Self-Adherent Wrap 4x5  (in/yd) Discharge Instruction: Apply over Kerlix as directed. Compression Stockings Add-Ons Electronic Signature(s) Signed: 06/20/2020 3:01:45 PM By: Kalman Shan DO Signed: 06/20/2020 6:06:42 PM By: Baruch Gouty RN, BSN Entered By: Kalman Shan on 06/20/2020 14:41:34 -------------------------------------------------------------------------------- Multi-Disciplinary Care Plan Details Patient Name: Date of Service: [name], Arriona A. 06/20/2020 2:00 PM Medical Record Number: [id number] Patient Account Number: [id number] Date of Birth/Sex: Treating RN: Jun 18, 1925 (87 y.o. [name] Primary Care Thomes Burak: [name] Other Clinician: Referring Ysmael Hires: Treating Starlene Consuegra/Extender: [name] in Treatment: 1 Multidisciplinary Care Plan reviewed with physician Active Inactive Malignancy/Atypical Etiology Nursing Diagnoses: Knowledge deficit related to disease process and management of malignancy Goals: Patient/caregiver will verbalize understanding of disease process and disease management of atypical ulcer etiology Date Initiated: 06/11/2020 Target Resolution Date: 07/06/2020 Goal Status: Active Patient/caregiver will verbalize understanding of disease process and disease management of malignancy Date Initiated: 06/11/2020 Target Resolution Date: 07/06/2020 Goal Status: Active Interventions: Assess patient and family medical history for signs and symptoms of malignancy/atypical etiology upon admission Provide education on atypical ulcer etiologies Provide education on malignant ulcerations Notes: Wound/Skin Impairment  Nursing Diagnoses: Impaired tissue integrity Knowledge deficit related to ulceration/compromised skin integrity Goals: Patient/caregiver will verbalize understanding of skin care regimen Date Initiated: 06/11/2020 Target Resolution Date: 07/06/2020 Goal Status: Active Interventions: Assess patient/caregiver  ability to obtain necessary supplies Assess patient/caregiver ability to perform ulcer/skin care regimen upon admission and as needed Assess ulceration(s) every visit Provide education on ulcer and skin care Notes: Electronic Signature(s) Signed: 06/20/2020 5:37:40 PM By: Lorrin Jackson Entered By: Lorrin Jackson on 06/20/2020 14:14:20 -------------------------------------------------------------------------------- Pain Assessment Details Patient Name: Date of Service: [name], Tajai A. 06/20/2020 2:00 PM Medical Record Number: [id number] Patient Account Number: [id number] Date of Birth/Sex: Treating RN: Dec 05, 1925 (87 y.o. [name] Primary Care Hazel Wrinkle: Other Clinician: [name] Referring Takoda Janowiak: Treating Khalifa Knecht/Extender: [name] in Treatment: 1 Active Problems Location of Pain Severity and Description of Pain Patient Has Paino No Site Locations Rate the pain. Current Pain Level: 0 Pain Management and Medication Current Pain Management: Medication: No Cold Application: No Rest: No Massage: No Activity: No T.E.N.S.: No Heat Application: No Leg drop or elevation: No Is the Current Pain Management Adequate: Inadequate How does your wound impact your activities of daily livingo Sleep: No Bathing: No Appetite: No Relationship With Others: No Bladder Continence: No Emotions: No Bowel Continence: No Work: No Toileting: No Drive: No Dressing: No Hobbies: No Notes Patient c/o pain at times throughout the week. Patient currently no pain. Electronic Signature(s) Signed: 06/20/2020 5:52:00 PM By: Deon Pilling Entered By: Deon Pilling on 06/20/2020 13:52:17 -------------------------------------------------------------------------------- Patient/Caregiver Education Details Patient Name: Date of Service: [name], [name] 6/1/2022andnbsp2:00 PM Medical Record Number: [id number] Patient Account Number:  [id number] Date of Birth/Gender: Treating RN: [birth year] (87 y.o. [name] Primary Care Physician: [name] Other Clinician: Referring Physician: Treating Physician/Extender: [name] in Treatment: 1 Education Assessment Education Provided To: Patient Education Topics Provided Malignant/Atypical Wounds: Methods: Explain/Verbal Responses: State content correctly Venous: Methods: Explain/Verbal, Printed Responses: State content correctly Wound/Skin Impairment: Methods: Explain/Verbal, Printed Responses: State content correctly Electronic Signature(s) Signed: 06/20/2020 5:37:40 PM By: Lorrin Jackson Entered By: Lorrin Jackson on 06/20/2020 14:14:57 -------------------------------------------------------------------------------- Wound Assessment Details Patient Name: Date of Service: [name], [name] 06/20/2020 2:00 PM Medical Record Number: [id number] Patient Account Number: [id number] Date of Birth/Sex: Treating RN: 25-May-1925 (87 y.o. [name], [name] Primary Care Shanique Aslinger: [name] Other Clinician: Referring Ben Habermann: Treating Onica Davidovich/Extender: [name] in Treatment: 1 Wound Status Wound Number: 6 Primary Malignant Wound Etiology: Wound Location: Right, Anterior Lower Leg Wound Open Wounding Event: Blister Status: Date Acquired: 04/20/2020 Comorbid Anemia, Arrhythmia, Congestive Heart Failure, Hypertension, Weeks Of Treatment: 1 History: Colitis, Osteoarthritis Clustered Wound: No Photos Wound Measurements Length: (cm) 4 Width: (cm) 3 Depth: (cm) 0.2 Area: (cm) 9.425 Volume: (cm) 1.885 % Reduction in Area: -49.1% % Reduction in Volume: 0.6% Epithelialization: None Tunneling: No Undermining: No Wound Description Classification: Full Thickness Without Exposed Support Structures Wound Margin: Distinct, outline attached Exudate Amount: Medium Exudate Type:  Serosanguineous Exudate Color: red, brown Foul Odor After Cleansing: No Slough/Fibrino Yes Wound Bed Granulation Amount: Small (1-33%) Exposed Structure Granulation Quality: Red, Pink Fascia Exposed: No Necrotic Amount: Large (67-100%) Fat Layer (Subcutaneous Tissue) Exposed: Yes Necrotic Quality: Adherent Slough Tendon Exposed: No Muscle Exposed: No Joint Exposed: No Bone Exposed: No Treatment Notes Wound #6 (Lower Leg) Wound Laterality: Right, Anterior Cleanser Soap and Water Discharge Instruction: May shower and wash wound with dial antibacterial soap and water prior  to dressing change. Peri-Wound Care Triamcinolone 15 (g) Discharge Instruction: Use triamcinolone 15 (g) as directed Sween Lotion (Moisturizing lotion) Discharge Instruction: Apply moisturizing lotion as directed Topical Primary Dressing KerraCel Ag Gelling Fiber Dressing, 2x2 in (silver alginate) Discharge Instruction: Apply silver alginate to wound bed as instructed Secondary Dressing Woven Gauze Sponge, Non-Sterile 4x4 in Discharge Instruction: Apply over primary dressing as directed. ABD Pad, 8x10 Discharge Instruction: Apply over primary dressing as directed. Secured With Compression Wrap Kerlix Roll 4.5x3.1 (in/yd) Discharge Instruction: Apply Kerlix and Coban compression as directed. Coban Self-Adherent Wrap 4x5 (in/yd) Discharge Instruction: Apply over Kerlix as directed. Compression Stockings Add-Ons Electronic Signature(s) Signed: 06/21/2020 1:41:47 PM By: Sandre Kitty Signed: 06/21/2020 5:16:10 PM By: Deon Pilling Previous Signature: 06/20/2020 5:52:00 PM Version By: Deon Pilling Entered By: Sandre Kitty on 06/21/2020 13:05:14 -------------------------------------------------------------------------------- Wound Assessment Details Patient Name: Date of Service: [name], [name]. 06/20/2020 2:00 PM Medical Record Number: [id number] Patient Account Number: [id number] Date of  Birth/Sex: Treating RN: 08-10-1925 (87 y.o. [name], [name] Primary Care Tykeisha Peer: [name] Other Clinician: Referring Ermal Brzozowski: Treating Moataz Tavis/Extender: [name] in Treatment: 1 Wound Status Wound Number: 7 Primary Malignant Wound Etiology: Wound Location: Left, Anterior Lower Leg Wound Open Wounding Event: Blister Status: Date Acquired: 04/20/2020 Comorbid Anemia, Arrhythmia, Congestive Heart Failure, Hypertension, Weeks Of Treatment: 1 History: Colitis, Osteoarthritis Clustered Wound: No Wound Measurements Length: (cm) 2.5 Width: (cm) 2 Depth: (cm) 0.1 Area: (cm) 3.927 Volume: (cm) 0.393 % Reduction in Area: -27.9% % Reduction in Volume: -28% Epithelialization: None Tunneling: No Undermining: No Wound Description Classification: Full Thickness Without Exposed Support Structures Wound Margin: Distinct, outline attached Exudate Amount: Medium Exudate Type: Serosanguineous Exudate Color: red, brown Foul Odor After Cleansing: No Slough/Fibrino Yes Wound Bed Granulation Amount: Small (1-33%) Exposed Structure Granulation Quality: Red Fascia Exposed: No Necrotic Amount: Large (67-100%) Fat Layer (Subcutaneous Tissue) Exposed: Yes Necrotic Quality: Adherent Slough Tendon Exposed: No Muscle Exposed: No Joint Exposed: No Bone Exposed: No Treatment Notes Wound #7 (Lower Leg) Wound Laterality: Left, Anterior Cleanser Soap and Water Discharge Instruction: May shower and wash wound with dial antibacterial soap and water prior to dressing change. Peri-Wound Care Triamcinolone 15 (g) Discharge Instruction: Use triamcinolone 15 (g) as directed Sween Lotion (Moisturizing lotion) Discharge Instruction: Apply moisturizing lotion as directed Topical Primary Dressing KerraCel Ag Gelling Fiber Dressing, 2x2 in (silver alginate) Discharge Instruction: Apply silver alginate to wound bed as instructed Secondary  Dressing Woven Gauze Sponge, Non-Sterile 4x4 in Discharge Instruction: Apply over primary dressing as directed. ABD Pad, 8x10 Discharge Instruction: Apply over primary dressing as directed. Secured With Compression Wrap Kerlix Roll 4.5x3.1 (in/yd) Discharge Instruction: Apply Kerlix and Coban compression as directed. Coban Self-Adherent Wrap 4x5 (in/yd) Discharge Instruction: Apply over Kerlix as directed. Compression Stockings Add-Ons Electronic Signature(s) Signed: 06/21/2020 1:41:47 PM By: Sandre Kitty Signed: 06/21/2020 5:16:10 PM By: Deon Pilling Previous Signature: 06/20/2020 5:52:00 PM Version By: Deon Pilling Entered By: Sandre Kitty on 06/21/2020 13:05:49 -------------------------------------------------------------------------------- Vitals Details Patient Name: Date of Service: [name], [name] 06/20/2020 2:00 PM Medical Record Number: [id number] Patient Account Number: [id number] Date of Birth/Sex: Treating RN: [birth year] (87 y.o. [name], [name] Primary Care Shalunda Lindh: [name] Other Clinician: Referring Taquana Bartley: Treating Cameren Earnest/Extender: [name] in Treatment: 1 Vital Signs Time Taken: 13:40 Temperature (F): 97.4 Height (in): 65 Pulse (bpm): 69 Weight (lbs): 163 Respiratory Rate (breaths/min): 16 Body Mass Index (BMI): 27.1 Blood Pressure (mmHg): 125/73 Reference Range: 80 -  120 mg / dl Electronic Signature(s) Signed: 06/20/2020 5:52:00 PM By: Deon Pilling Entered By: Deon Pilling on 06/20/2020 13:51:37

## 2020-06-25 ENCOUNTER — Encounter: Payer: Self-pay | Admitting: Internal Medicine

## 2020-06-25 ENCOUNTER — Other Ambulatory Visit: Payer: Self-pay

## 2020-06-25 ENCOUNTER — Ambulatory Visit (INDEPENDENT_AMBULATORY_CARE_PROVIDER_SITE_OTHER): Payer: Medicare Other | Admitting: Internal Medicine

## 2020-06-25 DIAGNOSIS — I4891 Unspecified atrial fibrillation: Secondary | ICD-10-CM | POA: Diagnosis not present

## 2020-06-25 DIAGNOSIS — M545 Low back pain, unspecified: Secondary | ICD-10-CM

## 2020-06-25 DIAGNOSIS — N183 Chronic kidney disease, stage 3 unspecified: Secondary | ICD-10-CM | POA: Diagnosis not present

## 2020-06-25 DIAGNOSIS — C4490 Unspecified malignant neoplasm of skin, unspecified: Secondary | ICD-10-CM

## 2020-06-25 DIAGNOSIS — R609 Edema, unspecified: Secondary | ICD-10-CM

## 2020-06-25 NOTE — Assessment & Plan Note (Signed)
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

## 2020-06-25 NOTE — Assessment & Plan Note (Signed)
Monitor BMET. 

## 2020-06-25 NOTE — Assessment & Plan Note (Signed)
On Eliquis

## 2020-06-25 NOTE — Assessment & Plan Note (Signed)
Chronic. 

## 2020-06-25 NOTE — Progress Notes (Signed)
Subjective:  Patient ID: [name], female    DOB: August 28, 1925  Age: 87 y.o. 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    Outpatient Medications Prior to Visit  Medication Sig Dispense Refill  . acetaminophen (TYLENOL) 500 MG tablet Take 500 mg by mouth daily as needed for mild pain.     [name] apixaban (ELIQUIS) 5 MG TABS tablet Take 1 tablet (5 mg total) by mouth 2 (two) times daily. 180 tablet 3  . B Complex-C (B-COMPLEX WITH VITAMIN C) tablet Take 1 tablet by mouth daily.    . busPIRone (BUSPAR) 15 MG tablet Take 1 tablet (15 mg total) by mouth 2 (two) times daily. 180 tablet 11  . Cholecalciferol (VITAMIN D3) 1000 UNITS tablet Take 1,000 Units by mouth daily.    [name] diltiazem (CARDIZEM CD) 120 MG 24 hr capsule TAKE 1 CAPSULE (120 MG TOTAL) BY MOUTH DAILY. 90 capsule 3  . doxycycline (VIBRA-TABS) 100 MG tablet Take 1 tablet (100 mg total) by mouth daily. 90 tablet 3  . famotidine (PEPCID) 20 MG tablet Take 20 mg by mouth daily.    [name] gabapentin (NEURONTIN) 100 MG capsule Take 1 capsule (100 mg total) by mouth at bedtime. Pain 90 capsule 3  . levothyroxine (SYNTHROID) 25 MCG tablet TAKE 1 TABLET BY MOUTH DAILY FOR THYROID. 30 tablet 11  . linaclotide (LINZESS) 290 MCG CAPS capsule Take 1 capsule (290 mcg total) by mouth daily. 30 capsule 11  . metoprolol succinate (TOPROL-XL) 50 MG 24 hr tablet Take 0.5 tablets (25 mg total) by mouth 2 (two) times daily. Take with or immediately following a meal. 180 tablet 3  . Multiple Vitamins-Minerals (MULTIVITAMIN WITH MINERALS) tablet Take 1 tablet by mouth daily.    . polyethylene glycol (MIRALAX) 17 g packet Take 17 g by mouth daily. 30 each 0  . temazepam (RESTORIL) 15 MG capsule TAKE 1 CAPSULE BY MOUTH EVERY NIGHT  AT BEDTIME AS NEEDED FOR SLEEP 60 capsule 3   No facility-administered medications prior to visit.    ROS: Review of Systems  Constitutional: Positive for fatigue. Negative for activity change, appetite change, chills and unexpected weight change.  HENT: Negative for congestion, mouth sores and sinus pressure.   Eyes: Negative for visual disturbance.  Respiratory: Negative for cough and chest tightness.   Gastrointestinal: Negative for abdominal pain and nausea.  Genitourinary: Negative for difficulty urinating, frequency and vaginal pain.  Musculoskeletal: Positive for gait problem. Negative for back pain.  Skin: Positive for wound. Negative for pallor and rash.  Neurological: Negative for dizziness, tremors, weakness, numbness and headaches.  Psychiatric/Behavioral: Negative for confusion, sleep disturbance and suicidal ideas. The patient is nervous/anxious.     Objective:  BP 116/68   Pulse 70   Temp 97.8 F (36.6 C) (Oral)   Ht 5\' 5"  (1.651 m)   Wt 155 lb (70.3 kg)   SpO2 98%   BMI 25.79 kg/m   BP Readings from Last 3 Encounters:  06/25/20 116/68  06/20/20 (!) 136/59  06/06/20 121/67    Wt Readings from Last 3 Encounters:  06/25/20 155 lb (70.3 kg)  06/20/20 156 lb 12.8 oz (71.1 kg)  06/06/20 164 lb (74.4 kg)    Physical Exam Constitutional:      General: She  is not in acute distress.    Appearance: She is well-developed.  HENT:     Head: Normocephalic.     Right Ear: External ear normal.     Left Ear: External ear normal.     Nose: Nose normal.  Eyes:     General:        Right eye: No discharge.        Left eye: No discharge.     Conjunctiva/sclera: Conjunctivae normal.     Pupils: Pupils are equal, round, and reactive to light.  Neck:     Thyroid: No thyromegaly.     Vascular: No JVD.     Trachea: No tracheal deviation.  Cardiovascular:     Rate and Rhythm: Normal rate and regular rhythm.     Heart sounds: Normal heart sounds.  Pulmonary:      Effort: No respiratory distress.     Breath sounds: No stridor. No wheezing.  Abdominal:     General: Bowel sounds are normal. There is no distension.     Palpations: Abdomen is soft. There is no mass.     Tenderness: There is no abdominal tenderness. There is no guarding or rebound.  Musculoskeletal:        General: Tenderness present.     Cervical back: Normal range of motion and neck supple.     Right lower leg: Edema present.     Left lower leg: Edema present.  Lymphadenopathy:     Cervical: No cervical adenopathy.  Skin:    Findings: No erythema or rash.  Neurological:     Mental Status: She is oriented to person, place, and time.     Cranial Nerves: No cranial nerve deficit.     Motor: No abnormal muscle tone.     Coordination: Coordination abnormal.     Gait: Gait abnormal.     Deep Tendon Reflexes: Reflexes normal.  Psychiatric:        Behavior: Behavior normal.        Thought Content: Thought content normal.        Judgment: Judgment normal.    Using a walker Legs are wrapped  Lab Results  Component Value Date   WBC 5.7 06/04/2020   HGB 12.1 06/04/2020   HCT 37.6 06/04/2020   PLT 197 06/04/2020   GLUCOSE 103 (H) 06/04/2020   CHOL 116 06/25/2012   TRIG 82 09/05/2013   HDL 41.50 06/25/2012   LDLDIRECT 110.3 05/24/2008   LDLCALC 66 06/25/2012   ALT 10 06/04/2020   AST 20 06/04/2020   NA 140 06/04/2020   K 3.6 06/04/2020   CL 102 06/04/2020   CREATININE 1.03 (H) 06/04/2020   BUN 17 06/04/2020   CO2 30 06/04/2020   TSH 0.66 06/16/2019   INR 1.33 05/23/2017   HGBA1C 5.9 (H) 10/07/2018    VAS [place] ABI WITH/WO TBI  Result Date: 06/14/2020  LOWER EXTREMITY DOPPLER STUDY Patient Name:  [name]  Date of Exam:   06/14/2020 Medical Rec #: [id number]      Accession #:    [id number] Date of Birth: 1925/10/23     Patient Gender: F Patient Age:   89Y Exam Location:  [facility] Procedure:      VAS [place] ABI WITH/WO TBI Referring Phys: [id number]  JESSICA RATLIFF HOFFMAN --------------------------------------------------------------------------------  Indications: Ulceration. High Risk Factors: Hypertension, no history of smoking.  Vascular Interventions: Chronic venous insufficiency. Limitations: Today's exam was limited due to bandages and an open wound.  Performing Technologist: Delorise Shiner RVT  Examination Guidelines: A complete evaluation includes at minimum, Doppler waveform signals and systolic blood pressure reading at the level of bilateral brachial, anterior tibial, and posterior tibial arteries, when vessel segments are accessible. Bilateral testing is considered an integral part of a complete examination. Photoelectric Plethysmograph (PPG) waveforms and toe systolic pressure readings are included as required and additional duplex testing as needed. Limited examinations for reoccurring indications may be performed as noted.  ABI Findings: +---------+------------------+-----+---------+--------+ Right    Rt Pressure (mmHg)IndexWaveform Comment  +---------+------------------+-----+---------+--------+ Brachial 165                                      +---------+------------------+-----+---------+--------+ ATA      215               1.30                   +---------+------------------+-----+---------+--------+ PTA      191               1.16 triphasic         +---------+------------------+-----+---------+--------+ DP                              biphasic          +---------+------------------+-----+---------+--------+ Great Toe135               0.82                   +---------+------------------+-----+---------+--------+ +---------+------------------+-----+----------+-------+ Left     Lt Pressure (mmHg)IndexWaveform  Comment +---------+------------------+-----+----------+-------+ Brachial 164                                      +---------+------------------+-----+----------+-------+ ATA      187                1.13                   +---------+------------------+-----+----------+-------+ PTA      204               1.24 triphasic         +---------+------------------+-----+----------+-------+ DP                              monophasic        +---------+------------------+-----+----------+-------+ Great Toe160               0.97                   +---------+------------------+-----+----------+-------+ +-------+-----------+-----------+------------+------------+ ABI/TBIToday's ABIToday's TBIPrevious ABIPrevious TBI +-------+-----------+-----------+------------+------------+ Right  1.30       0.82                                +-------+-----------+-----------+------------+------------+ Left   1.24       0.97                                +-------+-----------+-----------+------------+------------+   Summary: Right: Resting right ankle-brachial index is within normal range. No evidence of significant right lower extremity arterial disease. The right toe-brachial  index is normal. RT great toe pressure = 135 mmHg. Left: Resting left ankle-brachial index is within normal range. No evidence of significant left lower extremity arterial disease. The left toe-brachial index is normal. LT Great toe pressure = 160 mmHg.  *See table(s) above for measurements and observations.  Electronically signed by Deitra Mayo MD on 06/14/2020 at 4:34:25 PM.    Final     Assessment & Plan:   There are no diagnoses linked to this encounter.   No orders of the defined types were placed in this encounter.    Follow-up: No follow-ups on file.  Walker Kehr, MD

## 2020-06-25 NOTE — Assessment & Plan Note (Signed)
Chronic Using a walker  Continue Gabapentin at hs, Tylenol

## 2020-06-27 DIAGNOSIS — C44722 Squamous cell carcinoma of skin of right lower limb, including hip: Secondary | ICD-10-CM | POA: Diagnosis not present

## 2020-06-27 DIAGNOSIS — L97812 Non-pressure chronic ulcer of other part of right lower leg with fat layer exposed: Secondary | ICD-10-CM | POA: Diagnosis not present

## 2020-06-27 DIAGNOSIS — Z4800 Encounter for change or removal of nonsurgical wound dressing: Secondary | ICD-10-CM | POA: Diagnosis not present

## 2020-06-27 DIAGNOSIS — I872 Venous insufficiency (chronic) (peripheral): Secondary | ICD-10-CM | POA: Diagnosis not present

## 2020-06-27 DIAGNOSIS — C44729 Squamous cell carcinoma of skin of left lower limb, including hip: Secondary | ICD-10-CM | POA: Diagnosis not present

## 2020-06-27 DIAGNOSIS — L97822 Non-pressure chronic ulcer of other part of left lower leg with fat layer exposed: Secondary | ICD-10-CM | POA: Diagnosis not present

## 2020-06-27 NOTE — Progress Notes (Signed)
Pharmacist Chemotherapy Monitoring - Initial Assessment    Anticipated start date: 07/04/20   Regimen:  . Are orders appropriate based on the patient's diagnosis, regimen, and cycle? Yes . Does the plan date match the patient's scheduled date? Yes . Is the sequencing of drugs appropriate? Yes . Are the premedications appropriate for the patient's regimen? Yes . Prior Authorization for treatment is: Not Started o If applicable, is the correct biosimilar selected based on the patient's insurance? not applicable  Organ Function and Labs: [name] Are dose adjustments needed based on the patient's renal function, hepatic function, or hematologic function? No . Are appropriate labs ordered prior to the start of patient's treatment? No . Other organ system assessment, if indicated: N/A . The following baseline labs, if indicated, have been ordered: cemiplimab: baseline TSH +/- T4  Dose Assessment: . Are the drug doses appropriate? Yes . Are the following correct: o Drug concentrations Yes o IV fluid compatible with drug Yes o Administration routes Yes o Timing of therapy Yes . If applicable, does the patient have documented access for treatment and/or plans for port-a-cath placement? no . If applicable, have lifetime cumulative doses been properly documented and assessed? not applicable Lifetime Dose Tracking  No doses have been documented on this patient for the following tracked chemicals: Doxorubicin, Epirubicin, Idarubicin, Daunorubicin, Mitoxantrone, Bleomycin, Oxaliplatin, Carboplatin, Liposomal Doxorubicin  o   Toxicity Monitoring/Prevention: . The patient has the following take home antiemetics prescribed: Need home antiemetic. . The patient has the following take home medications prescribed: N/A . Medication allergies and previous infusion related reactions, if applicable, have been reviewed and addressed. Yes . The patient's current medication list has been assessed for drug-drug  interactions with their chemotherapy regimen. no significant drug-drug interactions were identified on review.  Order Review: . Are the treatment plan orders signed? No . Is the patient scheduled to see a provider prior to their treatment? Yes  I verify that I have reviewed each item in the above checklist and answered each question accordingly.   Kennith Center, Pharm.D., CPP 06/27/2020@4 :01 PM

## 2020-06-29 ENCOUNTER — Encounter (HOSPITAL_BASED_OUTPATIENT_CLINIC_OR_DEPARTMENT_OTHER): Payer: Medicare Other | Admitting: Internal Medicine

## 2020-07-02 DIAGNOSIS — C44729 Squamous cell carcinoma of skin of left lower limb, including hip: Secondary | ICD-10-CM | POA: Diagnosis not present

## 2020-07-02 DIAGNOSIS — C44722 Squamous cell carcinoma of skin of right lower limb, including hip: Secondary | ICD-10-CM | POA: Diagnosis not present

## 2020-07-02 DIAGNOSIS — L97822 Non-pressure chronic ulcer of other part of left lower leg with fat layer exposed: Secondary | ICD-10-CM | POA: Diagnosis not present

## 2020-07-02 DIAGNOSIS — I872 Venous insufficiency (chronic) (peripheral): Secondary | ICD-10-CM | POA: Diagnosis not present

## 2020-07-02 DIAGNOSIS — L97812 Non-pressure chronic ulcer of other part of right lower leg with fat layer exposed: Secondary | ICD-10-CM | POA: Diagnosis not present

## 2020-07-02 DIAGNOSIS — Z4800 Encounter for change or removal of nonsurgical wound dressing: Secondary | ICD-10-CM | POA: Diagnosis not present

## 2020-07-03 NOTE — Progress Notes (Signed)
[facility]  [phone] [phone]     ID: [name] DOB: [birth year]  MR#: [id number]  WHQ#:[id number]  Patient Care Team: [name] Anger, MD as PCP - General [name] Form Annita Brod, MD as PCP - Cardiology (Cardiology) [name] Bookbinder, MD as Consulting Physician (General Surgery) [name], [name] Dad, MD as Consulting Physician (Oncology) [name] Blanks, MD as Consulting Physician (Cardiology) [name] Matin, MD as Consulting Physician (Dermatology) [name] Sa [place]onna [place], MD as Consulting Physician (Orthopedic Surgery) [name] Sprang, MD as Consulting Physician (Neurology) OTHER MD:  CHIEF COMPLAINT: Estrogen receptor positive invasive breast cancer  CURRENT TREATMENT: cemiplimab/ Libtayo   INTERVAL HISTORY: [name] returns today for follow-up of her new skin cancer. She is now under observation for her breast cancer.  She is accompanied by her granddaughter  She is scheduled to try systemic therapy with cemiplimab today.  The last mammogram we have records for is from July 2019 at [facility] showing a density category A.  [name] tells me there has been too much on her plate and she has neglected her health to some extent.   REVIEW OF SYSTEMS: [name] is at baseline.  She tells me she had 1 bowel movement yesterday, which was not very hard and not very soft.  She Todt does take Linzess to help with this.  Her wound care is now under home health and the bandages fairly complex so I am reluctant to undo it here but we do need imaging and that is discussed below.  Otherwise her review of systems today was stable   COVID 19 VACCINATION STATUS: fully vaccinated [employer]), with booster 10/2019   BREAST CANCER HISTORY: From the original intake note:  [name] noted a change in her left breast and had bilateral screening mammography at [facility] 06/07/2015, with tomosynthesis. Breast density was category A. There was a new oval mass in the left breast  central to the nipple. Left breast ultrasound the same day confirmed a 3.4 cm oval mass in the left breast upper outer quadrant, correlating with the mammography. The left axilla was sonographically benign.  Biopsy of the left breast mass in question 06/11/2015 showed ([id number]) a papillary lesion, consistent with an intraductal papilloma, but due to fragmentation the edges of the lesion could not be assessed.  Accordingly the patient was referred to surgery, and after appropriate discussion underwent left lumpectomy 07/25/2015. The final pathology ([id number]) showed 2 foci of invasive ductal carcinoma, grade 2, measuring 3.0 cm, and grade 1, measuring 0.9 cm. There was also ductal carcinoma in situ. The invasive tumor was present at the superior margin, and ductal carcinoma in situ was focally less than 0.1 cm to the same margin. There was evidence of lymphovascular invasion. The prognostic panel showed estrogen receptor 100% positive, progesterone receptor 100% positive, both with strong staining intensity, for both lesions. HER-2 was also negative for both lesions, the signals ratio being 1.45-1.65, and the number per cell 2.10-2.80.  The patient's subsequent history is as detailed below   PAST MEDICAL HISTORY: Past Medical History:  Diagnosis Date   Anxiety    denies   Atrial flutter ([place])    Bilateral edema of lower extremity    Breast cancer (HCC)    left breast   Chronic constipation    Chronic diastolic CHF (congestive heart failure) (HCC)    Diverticulosis of colon    GERD (gastroesophageal reflux disease)    H/O hiatal hernia    History of cellulitis  LEFT LOWER LEG --  SEPT 2015   History of colitis    DX  2003  WITH ISCHEMIC COLITIS   History of diverticulitis of colon    perferated diverticulitis w/ abscess 08-25-2013 drain placed --  resolved without surgical intervention   History of GI bleed    2011--- UPPER SECONARY GASTRIC ULCER FROM NSAID USE   History of  Helicobacter pylori infection    2011   History of ulcer of lower limb    2012   RIGHT LOWER EXTREMITIY--  STATSIS ULCER   Hyperlipidemia    Hypertension    Hypothyroidism    Iron deficiency anemia 2011   elevated MCV   LBP (low back pain)    severe lumbar spondylosis s/p L3/L4,L4/L5 fusion 12/10   Lumbar spondylolysis    OA (osteoarthritis)    "hands" (08/25/2013)   Open wound of left lower leg    Osteopenia    Persistent atrial fibrillation ([place])    CARDIOLOGIST-   DR MCALANY   RBBB    Urinary frequency     PAST SURGICAL HISTORY: Past Surgical History:  Procedure Laterality Date   APPLICATION OF A-CELL OF EXTREMITY Left 06/21/2013   Procedure: APPLICATION OF A-CELL OF EXTREMITY;  Surgeon: Theodoro Kos, DO;  Location: [place];  Service: Plastics;  Laterality: Left;   APPLICATION OF A-CELL OF EXTREMITY Left 08/22/2013   Procedure: APPLICATION OF A-CELL/VAC TO LOWER LEFT LEG WOUND;  Surgeon: Theodoro Kos, DO;  Location: [place];  Service: Plastics;  Laterality: Left;   APPLICATION OF A-CELL OF EXTREMITY Left 02/06/2014   Procedure: WITH PLACEMENT OF A -CELL ;  Surgeon: Theodoro Kos, DO;  Location: [employer];  Service: Plastics;  Laterality: Left;   APPLICATION OF A-CELL OF EXTREMITY Left 08/09/2014   Procedure: PLACEMENT OF A CELL;  Surgeon: Theodoro Kos, DO;  Location: [place];  Service: Plastics;  Laterality: Left;   APPLICATION OF WOUND VAC Left 06/16/2013   Procedure: APPLICATION OF WOUND VAC;  Surgeon: Meredith Pel, MD;  Location: [place];  Service: Orthopedics;  Laterality: Left;   APPLICATION OF WOUND VAC Left 06/21/2013   Procedure: APPLICATION OF WOUND VAC;  Surgeon: Theodoro Kos, DO;  Location: [place];  Service: Plastics;  Laterality: Left;   BIOPSY BREAST  07/25/2015   LEFT RADIOACTIVE SEED GUIDED EXCISIONAL BREAST BIOPSY (Left) as a surgical intervention   EXTERNAL FIXATION LEG Left 06/16/2013   Procedure: EXTERNAL FIXATION LEG;  Surgeon:  Meredith Pel, MD;  Location: [place];  Service: Orthopedics;  Laterality: Left;   EXTERNAL FIXATION REMOVAL Left 06/21/2013   Procedure: REMOVAL EXTERNAL FIXATION LEG;  Surgeon: Meredith Pel, MD;  Location: [place];  Service: Orthopedics;  Laterality: Left;   EYE SURGERY Bilateral    cataract removal   HARDWARE REMOVAL Left 05/23/2014   Procedure: HARDWARE REMOVAL;  Surgeon: Meredith Pel, MD;  Location: [place];  Service: Orthopedics;  Laterality: Left;  REMOVAL OF HARDWARE LEFT TIBIA   I & D EXTREMITY Left 06/16/2013   Procedure: IRRIGATION AND DEBRIDEMENT EXTREMITY;  Surgeon: Meredith Pel, MD;  Location: [place];  Service: Orthopedics;  Laterality: Left;   I & D EXTREMITY Left 02/06/2014   Procedure: IRRIGATION AND DEBRIDEMENT LEFT LEG WOUND ;  Surgeon: Theodoro Kos, DO;  Location: [place];  Service: Plastics;  Laterality: Left;   I & D EXTREMITY Left 08/09/2014   Procedure: IRRIGATION AND DEBRIDEMENT  OF LEFT LOWER LEG;  Surgeon: Theodoro Kos, DO;  Location: [place];  Service: Plastics;  Laterality: Left;   LUMBAR FUSION  01-02-2009   L3-L5   RADIOACTIVE SEED GUIDED EXCISIONAL BREAST BIOPSY Left 07/25/2015   Procedure: LEFT RADIOACTIVE SEED GUIDED EXCISIONAL BREAST BIOPSY;  Surgeon: [name] Bookbinder, MD;  Location: [place];  Service: General;  Laterality: Left;   RE-EXCISION OF BREAST LUMPECTOMY Left 09/05/2015   Procedure: RE-EXCISION OF LEFT BREAST LUMPECTOMY;  Surgeon: [name] Bookbinder, MD;  Location: [place];  Service: General;  Laterality: Left;   SHOULDER OPEN ROTATOR CUFF REPAIR Left 1997   TIBIA IM NAIL INSERTION Left 06/21/2013   Procedure: INTRAMEDULLARY (IM) NAIL TIBIAL;  Surgeon: Meredith Pel, MD;  Location: [place];  Service: Orthopedics;  Laterality: Left;   TONSILLECTOMY  AS CHILD   TOTAL HIP ARTHROPLASTY Right 06-07-2010   TRANSTHORACIC ECHOCARDIOGRAM  09-11-2010   DR MCALHANY   LVSF  55-60%/  MILD MV CALCIFICATION WITH NO STENOSIS/  MILD MR & TR /  MODERATE LAE/  MODERATE TO SEVERE RAE   UPPER GI ENDOSCOPY  03-29-2010    FAMILY HISTORY Family History  Problem Relation Age of Onset   Cancer Father    Hypertension Other   The patient's father died at the age of 45 from heart problems. The patient's mother died at the age of 33 with a ruptured aneurysm. The patient had no brothers, 1 sister. There is no history of breast or ovarian cancer in the family.    GYNECOLOGIC HISTORY:  No LMP recorded. Patient is postmenopausal. Menarche age 66, first live birth age 30. The patient is GX P2. She stopped having periods in her early 65s. She did not take hormone replacement. She used oral contraceptives remotely for approximately 2 years, with no complications.   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 Her husband Ernie Hew was a Airline pilot.  According to the patient there are significant cognitive limitations on his side.  They currently live at [facility]. Daughter Jerene Dilling lives in [place], near [place]. With her husband Jenny Reichmann. Daughter Pam lives in [place]. The patient has 2 grandchildren and 4 great-grandchildren. She is a Psychologist, forensic.   ADVANCED DIRECTIVES: The patient has a living will in place and she has named her granddaughter Almetta Lovely as her healthcare part of attorney. Lattie Haw can be reached at [place]: Social History   Tobacco Use   Smoking status: Never   Smokeless tobacco: Never  Vaping Use   Vaping Use: Never used  Substance Use Topics   Alcohol use: No    Comment: 1 glass occ wine 08/03/14- none in a year   Drug use: No     Colonoscopy: Laurence Spates, remote  PAP:  Bone density:   Allergies  Allergen Reactions   Zosyn [Piperacillin Sod-Tazobactam So] Hives, Itching, Rash and Other (See Comments)    Morbilliform eruptions with itching- looks like Measles   Atorvastatin     myalgias   Atenolol Nausea And Vomiting   Clarithromycin Nausea And  Vomiting   Codeine Sulfate Nausea Only   Levaquin [Levofloxacin] Nausea Only   Macrodantin Other (See Comments)    UNSPECIFIED    Oxycodone-Acetaminophen Nausea And Vomiting    Current Outpatient Medications  Medication Sig Dispense Refill   acetaminophen (TYLENOL) 500 MG tablet Take 500 mg by mouth daily as needed for mild pain.      apixaban (ELIQUIS) 5 MG TABS tablet Take 1 tablet (  5 mg total) by mouth 2 (two) times daily. 180 tablet 3   B Complex-C (B-COMPLEX WITH VITAMIN C) tablet Take 1 tablet by mouth daily.     busPIRone (BUSPAR) 15 MG tablet Take 1 tablet (15 mg total) by mouth 2 (two) times daily. 180 tablet 11   Cholecalciferol (VITAMIN D3) 1000 UNITS tablet Take 1,000 Units by mouth daily.     diltiazem (CARDIZEM CD) 120 MG 24 hr capsule TAKE 1 CAPSULE (120 MG TOTAL) BY MOUTH DAILY. 90 capsule 3   doxycycline (VIBRA-TABS) 100 MG tablet Take 1 tablet (100 mg total) by mouth daily. 90 tablet 3   famotidine (PEPCID) 20 MG tablet Take 20 mg by mouth daily.     gabapentin (NEURONTIN) 100 MG capsule Take 1 capsule (100 mg total) by mouth at bedtime. Pain 90 capsule 3   levothyroxine (SYNTHROID) 25 MCG tablet TAKE 1 TABLET BY MOUTH DAILY FOR THYROID. 30 tablet 11   linaclotide (LINZESS) 290 MCG CAPS capsule Take 1 capsule (290 mcg total) by mouth daily. 30 capsule 11   metoprolol succinate (TOPROL-XL) 50 MG 24 hr tablet Take 0.5 tablets (25 mg total) by mouth 2 (two) times daily. Take with or immediately following a meal. 180 tablet 3   Multiple Vitamins-Minerals (MULTIVITAMIN WITH MINERALS) tablet Take 1 tablet by mouth daily.     polyethylene glycol (MIRALAX) 17 g packet Take 17 g by mouth daily. 30 each 0   temazepam (RESTORIL) 15 MG capsule TAKE 1 CAPSULE BY MOUTH EVERY NIGHT AT BEDTIME AS NEEDED FOR SLEEP 60 capsule 3   No current facility-administered medications for this visit.    OBJECTIVE: White woman who appears stated age  37:   07/04/20 1320  BP: (!) 144/63   Pulse: 66  Resp: 18  Temp: (!) 97.5 F (36.4 C)  SpO2: 95%     Body mass index is 25.98 kg/m.     ECOG FS:1 - Symptomatic but completely ambulatory  Sclerae unicteric, EOMs intact Wearing a mask No cervical or supraclavicular adenopathy Lungs no rales or rhonchi Heart regular rate and rhythm Abd soft, nontender, positive bowel sounds MSK no focal spinal tenderness, no upper extremity lymphedema Neuro: nonfocal, well oriented, appropriate affect Breasts: Deferred Skin: Baseline image from 06/20/2020    06/20/2020 [place] appointment    LAB RESULTS:  CMP     Component Value Date/Time   NA 139 07/04/2020 1309   NA 134 08/11/2018 1425   NA 137 07/28/2016 1334   K 3.6 07/04/2020 1309   K 3.8 07/28/2016 1334   CL 102 07/04/2020 1309   CO2 27 07/04/2020 1309   CO2 28 07/28/2016 1334   GLUCOSE 122 (H) 07/04/2020 1309   GLUCOSE 160 (H) 07/28/2016 1334   BUN 17 07/04/2020 1309   BUN 20 08/11/2018 1425   BUN 9.5 07/28/2016 1334   CREATININE 1.13 (H) 07/04/2020 1309   CREATININE 1.0 07/28/2016 1334   CALCIUM 9.7 07/04/2020 1309   CALCIUM 9.6 07/28/2016 1334   PROT 7.6 07/04/2020 1309   PROT 7.4 07/28/2016 1334   ALBUMIN 3.6 07/04/2020 1309   ALBUMIN 4.0 07/28/2016 1334   AST 20 07/04/2020 1309   AST 21 07/28/2016 1334   ALT 10 07/04/2020 1309   ALT 24 07/28/2016 1334   ALKPHOS 78 07/04/2020 1309   ALKPHOS 96 07/28/2016 1334   BILITOT 1.1 07/04/2020 1309   BILITOT 1.21 (H) 07/28/2016 1334   GFRNONAA 46 (L) 07/04/2020 1309   GFRAA 50 (L)  10/03/2019 1355    INo results found for: SPEP, UPEP  Lab Results  Component Value Date   WBC 5.7 07/04/2020   NEUTROABS 3.4 07/04/2020   HGB 12.5 07/04/2020   HCT 38.4 07/04/2020   MCV 105.5 (H) 07/04/2020   PLT 206 07/04/2020      Chemistry      Component Value Date/Time   NA 139 07/04/2020 1309   NA 134 08/11/2018 1425   NA 137 07/28/2016 1334   K 3.6 07/04/2020 1309   K 3.8 07/28/2016 1334   CL  102 07/04/2020 1309   CO2 27 07/04/2020 1309   CO2 28 07/28/2016 1334   BUN 17 07/04/2020 1309   BUN 20 08/11/2018 1425   BUN 9.5 07/28/2016 1334   CREATININE 1.13 (H) 07/04/2020 1309   CREATININE 1.0 07/28/2016 1334      Component Value Date/Time   CALCIUM 9.7 07/04/2020 1309   CALCIUM 9.6 07/28/2016 1334   ALKPHOS 78 07/04/2020 1309   ALKPHOS 96 07/28/2016 1334   AST 20 07/04/2020 1309   AST 21 07/28/2016 1334   ALT 10 07/04/2020 1309   ALT 24 07/28/2016 1334   BILITOT 1.1 07/04/2020 1309   BILITOT 1.21 (H) 07/28/2016 1334       No results found for: LABCA2  No components found for: LABCA125  No results for input(s): INR in the last 168 hours.  Urinalysis    Component Value Date/Time   COLORURINE YELLOW 10/07/2018 1535   APPEARANCEUR CLEAR 10/07/2018 1535   LABSPEC 1.012 10/07/2018 1535   PHURINE 6.0 10/07/2018 1535   GLUCOSEU NEGATIVE 10/07/2018 1535   GLUCOSEU NEGATIVE 01/17/2015 1415   HGBUR NEGATIVE 10/07/2018 1535   [place] 10/07/2018 1535   [place] 10/07/2018 1535   PROTEINUR 30 (A) 10/07/2018 1535   UROBILINOGEN 0.2 01/17/2015 1415   NITRITE NEGATIVE 10/07/2018 1535   LEUKOCYTESUR NEGATIVE 10/07/2018 1535    STUDIES: VAS [place] ABI WITH/WO TBI  Result Date: 06/14/2020  LOWER EXTREMITY DOPPLER STUDY Patient Name:  BRENDIA Bell  Date of Exam:   06/14/2020 Medical Rec #: [id number]      Accession #:    [id number] Date of Birth: 1925-07-18     Patient Gender: F Patient Age:   89Y Exam Location:  [facility] Procedure:      VAS [place] ABI WITH/WO TBI Referring Phys: [id number] JESSICA RATLIFF HOFFMAN --------------------------------------------------------------------------------  Indications: Ulceration. High Risk Factors: Hypertension, no history of smoking.  Vascular Interventions: Chronic venous insufficiency. Limitations: Today's exam was limited due to bandages and an open wound. Performing Technologist: Delorise Shiner RVT   Examination Guidelines: A complete evaluation includes at minimum, Doppler waveform signals and systolic blood pressure reading at the level of bilateral brachial, anterior tibial, and posterior tibial arteries, when vessel segments are accessible. Bilateral testing is considered an integral part of a complete examination. Photoelectric Plethysmograph (PPG) waveforms and toe systolic pressure readings are included as required and additional duplex testing as needed. Limited examinations for reoccurring indications may be performed as noted.  ABI Findings: +---------+------------------+-----+---------+--------+ Right    Rt Pressure (mmHg)IndexWaveform Comment  +---------+------------------+-----+---------+--------+ Brachial 165                                      +---------+------------------+-----+---------+--------+ ATA      215               1.30                   +---------+------------------+-----+---------+--------+  PTA      191               1.16 triphasic         +---------+------------------+-----+---------+--------+ DP                              biphasic          +---------+------------------+-----+---------+--------+ Great Toe135               0.82                   +---------+------------------+-----+---------+--------+ +---------+------------------+-----+----------+-------+ Left     Lt Pressure (mmHg)IndexWaveform  Comment +---------+------------------+-----+----------+-------+ Brachial 164                                      +---------+------------------+-----+----------+-------+ ATA      187               1.13                   +---------+------------------+-----+----------+-------+ PTA      204               1.24 triphasic         +---------+------------------+-----+----------+-------+ DP                              monophasic        +---------+------------------+-----+----------+-------+ Great Toe160               0.97                    +---------+------------------+-----+----------+-------+ +-------+-----------+-----------+------------+------------+ ABI/TBIToday's ABIToday's TBIPrevious ABIPrevious TBI +-------+-----------+-----------+------------+------------+ Right  1.30       0.82                                +-------+-----------+-----------+------------+------------+ Left   1.24       0.97                                +-------+-----------+-----------+------------+------------+   Summary: Right: Resting right ankle-brachial index is within normal range. No evidence of significant right lower extremity arterial disease. The right toe-brachial index is normal. RT great toe pressure = 135 mmHg. Left: Resting left ankle-brachial index is within normal range. No evidence of significant left lower extremity arterial disease. The left toe-brachial index is normal. LT Great toe pressure = 160 mmHg.  *See table(s) above for measurements and observations.  Electronically signed by Deitra Mayo MD on 06/14/2020 at 4:34:25 PM.    Final       ELIGIBLE FOR AVAILABLE RESEARCH PROTOCOL: no  ASSESSMENT: 87 y.o. [place] woman status post left breast upper outer quadrant biopsy 06/11/2015 for a papillary lesion with unclear margins.  (1) status post left lumpectomy 07/25/2015 for an mpT2 cN0, stage IIA  invasive ductal carcinoma,  grade I-II  estrogen and progesterone receptor strongly positive, HER-2 not amplified   (a) additional surgery 09/05/2015 found some residual ductal carcinoma in situ but the margins were clear.  (2) no sentinel lymph node sampling or adjuvant radiation planned  (3) adjuvant anastrozole started 08/10/2015, completed May 2022  (4) squamous cell carcinoma of the  skin in both lower extremities  (a) to start cemiplimab/ Libtayo 07/04/2020, repeated every 21 days x 8  (b) consider cetuximab if Libtayo ineffective or not tolerated   PLAN:  [name] has a good understanding of the  possible toxicities side effects and complications of the antibody treatment she is about to receive today.  Just in case I am adding a virtual visit in about a week so we can troubleshoot any symptoms that may develop.  Assuming she tolerates it well and that there is no access issue requiring a port, the plan is to repeat the antibody every 21 days x 4 and then reassess.  If we are showing evidence of response we can certainly continue.  Otherwise we can consider switching to cetuximab  I have encouraged her and her granddaughter to call us with any issues that may develop before the next visit  Total encounter time 20 minutes.*   Akim Watkinson, [name] Dad, MD  07/04/20 1:51 PM Medical Oncology and Hematology [facility] [place], [place] [address] Tel. [phone]    Fax. [phone]   I, Wilburn Mylar, am acting as scribe for Dr. Virgie Bell. [name].  I, Lurline Del MD, have reviewed the above documentation for accuracy and completeness, and I agree with the above.   *Total Encounter Time as defined by the Centers for Medicare and Medicaid Services includes, in addition to the face-to-face time of a patient visit (documented in the note above) non-face-to-face time: obtaining and reviewing outside history, ordering and reviewing medications, tests or procedures, care coordination (communications with other health care professionals or caregivers) and documentation in the medical record.

## 2020-07-04 ENCOUNTER — Ambulatory Visit: Payer: Medicare Other | Admitting: Oncology

## 2020-07-04 ENCOUNTER — Inpatient Hospital Stay: Payer: Medicare Other

## 2020-07-04 ENCOUNTER — Inpatient Hospital Stay (HOSPITAL_BASED_OUTPATIENT_CLINIC_OR_DEPARTMENT_OTHER): Payer: Medicare Other | Admitting: Oncology

## 2020-07-04 ENCOUNTER — Other Ambulatory Visit: Payer: Self-pay | Admitting: *Deleted

## 2020-07-04 ENCOUNTER — Other Ambulatory Visit: Payer: Self-pay

## 2020-07-04 VITALS — BP 144/63 | HR 66 | Temp 97.5°F | Resp 18 | Ht 65.0 in | Wt 156.1 lb

## 2020-07-04 DIAGNOSIS — C50412 Malignant neoplasm of upper-outer quadrant of left female breast: Secondary | ICD-10-CM

## 2020-07-04 DIAGNOSIS — C44721 Squamous cell carcinoma of skin of unspecified lower limb, including hip: Secondary | ICD-10-CM | POA: Diagnosis not present

## 2020-07-04 DIAGNOSIS — Z79899 Other long term (current) drug therapy: Secondary | ICD-10-CM | POA: Diagnosis not present

## 2020-07-04 DIAGNOSIS — C44719 Basal cell carcinoma of skin of left lower limb, including hip: Secondary | ICD-10-CM | POA: Diagnosis not present

## 2020-07-04 DIAGNOSIS — Z5112 Encounter for antineoplastic immunotherapy: Secondary | ICD-10-CM | POA: Diagnosis not present

## 2020-07-04 DIAGNOSIS — E039 Hypothyroidism, unspecified: Secondary | ICD-10-CM | POA: Insufficient documentation

## 2020-07-04 DIAGNOSIS — Z170 Estrogen receptor positive status [ER+]: Secondary | ICD-10-CM

## 2020-07-04 DIAGNOSIS — C50912 Malignant neoplasm of unspecified site of left female breast: Secondary | ICD-10-CM

## 2020-07-04 DIAGNOSIS — C44729 Squamous cell carcinoma of skin of left lower limb, including hip: Secondary | ICD-10-CM | POA: Diagnosis not present

## 2020-07-04 DIAGNOSIS — C4490 Unspecified malignant neoplasm of skin, unspecified: Secondary | ICD-10-CM

## 2020-07-04 DIAGNOSIS — E064 Drug-induced thyroiditis: Secondary | ICD-10-CM

## 2020-07-04 DIAGNOSIS — Z853 Personal history of malignant neoplasm of breast: Secondary | ICD-10-CM | POA: Diagnosis not present

## 2020-07-04 LAB — COMPREHENSIVE METABOLIC PANEL
ALT: 10 U/L (ref 0–44)
AST: 20 U/L (ref 15–41)
Albumin: 3.6 g/dL (ref 3.5–5.0)
Alkaline Phosphatase: 78 U/L (ref 38–126)
Anion gap: 10 (ref 5–15)
BUN: 17 mg/dL (ref 8–23)
CO2: 27 mmol/L (ref 22–32)
Calcium: 9.7 mg/dL (ref 8.9–10.3)
Chloride: 102 mmol/L (ref 98–111)
Creatinine, Ser: 1.13 mg/dL — ABNORMAL HIGH (ref 0.44–1.00)
GFR, Estimated: 46 mL/min — ABNORMAL LOW (ref >60)
Glucose, Bld: 122 mg/dL — ABNORMAL HIGH (ref 70–99)
Potassium: 3.6 mmol/L (ref 3.5–5.1)
Sodium: 139 mmol/L (ref 135–145)
Total Bilirubin: 1.1 mg/dL (ref 0.3–1.2)
Total Protein: 7.6 g/dL (ref 6.5–8.1)

## 2020-07-04 LAB — CBC WITH DIFFERENTIAL/PLATELET
Abs Immature Granulocytes: 0.01 10*3/uL (ref 0.00–0.07)
Basophils Absolute: 0.1 10*3/uL (ref 0.0–0.1)
Basophils Relative: 1 %
Eosinophils Absolute: 0.1 10*3/uL (ref 0.0–0.5)
Eosinophils Relative: 2 %
HCT: 38.4 % (ref 36.0–46.0)
Hemoglobin: 12.5 g/dL (ref 12.0–15.0)
Immature Granulocytes: 0 %
Lymphocytes Relative: 26 %
Lymphs Abs: 1.5 10*3/uL (ref 0.7–4.0)
MCH: 34.3 pg — ABNORMAL HIGH (ref 26.0–34.0)
MCHC: 32.6 g/dL (ref 30.0–36.0)
MCV: 105.5 fL — ABNORMAL HIGH (ref 80.0–100.0)
Monocytes Absolute: 0.7 10*3/uL (ref 0.1–1.0)
Monocytes Relative: 12 %
Neutro Abs: 3.4 10*3/uL (ref 1.7–7.7)
Neutrophils Relative %: 59 %
Platelets: 206 10*3/uL (ref 150–400)
RBC: 3.64 MIL/uL — ABNORMAL LOW (ref 3.87–5.11)
RDW: 12.8 % (ref 11.5–15.5)
WBC: 5.7 10*3/uL (ref 4.0–10.5)
nRBC: 0 % (ref 0.0–0.2)

## 2020-07-04 MED ORDER — SODIUM CHLORIDE 0.9 % IV SOLN
Freq: Once | INTRAVENOUS | Status: AC
  Filled 2020-07-04: qty 250

## 2020-07-04 MED ORDER — SODIUM CHLORIDE 0.9 % IV SOLN
350.0000 mg | Freq: Once | INTRAVENOUS | Status: AC
  Administered 2020-07-04: 350 mg via INTRAVENOUS
  Filled 2020-07-04: qty 7

## 2020-07-04 MED ORDER — PROCHLORPERAZINE MALEATE 5 MG PO TABS: 5.0000 mg | ORAL_TABLET | Freq: Three times a day (TID) | ORAL | 1 refills | Status: DC | PRN

## 2020-07-04 NOTE — Addendum Note (Signed)
Addended by: Chauncey Cruel on: 07/04/2020 04:52 PM   Modules accepted: Orders

## 2020-07-04 NOTE — Patient Instructions (Signed)
[facility] MEDICAL [facility]COL[facility]Y  Discharge Instructions: Thank you for choosing [facility] to provide your oncology and hematology care.   If you have a lab appointment with the Cancer Center, please go directly to the Cancer Center and check in at the registration area.   Wear comfortable clothing and clothing appropriate for easy access to any Portacath or PICC line.   We strive to give you quality time with your provider. You may need to reschedule your appointment if you arrive late (15 or more minutes).  Arriving late affects you and other patients whose appointments are after yours.  Also, if you miss three or more appointments without notifying the office, you may be dismissed from the clinic at the provider's discretion.      For prescription refill requests, have your pharmacy contact our office and allow 72 hours for refills to be completed.    Today you received the following chemotherapy and/or immunotherapy agents libtayo   To help prevent nausea and vomiting after your treatment, we encourage you to take your nausea medication as directed.  BELOW ARE SYMPTOMS THAT SHOULD BE REPORTED IMMEDIATELY: . *FEVER GREATER THAN 100.4 F (38 C) OR HIGHER . *CHILLS OR SWEATING . *NAUSEA AND VOMITING THAT IS NOT CONTROLLED WITH YOUR NAUSEA MEDICATION . *UNUSUAL SHORTNESS OF BREATH . *UNUSUAL BRUISING OR BLEEDING . *URINARY PROBLEMS (pain or burning when urinating, or frequent urination) . *BOWEL PROBLEMS (unusual diarrhea, constipation, pain near the anus) . TENDERNESS IN MOUTH AND THROAT WITH OR WITHOUT PRESENCE OF ULCERS (sore throat, sores in mouth, or a toothache) . UNUSUAL RASH, SWELLING OR PAIN  . UNUSUAL VAGINAL DISCHARGE OR ITCHING   Items with * indicate a potential emergency and should be followed up as soon as possible or go to the Emergency Department if any problems should occur.  Please show the CHEMOTHERAPY ALERT CARD or IMMUNOTHERAPY ALERT CARD  at check-in to the Emergency Department and triage nurse.  Should you have questions after your visit or need to cancel or reschedule your appointment, please contact [facility]  Dept: [phone]  and follow the prompts.  Office hours are 8:00 a.m. to 4:30 p.m. Monday - Friday. Please note that voicemails left after 4:00 p.m. may not be returned until the following business day.  We are closed weekends and major holidays. You have access to a nurse at all times for urgent questions. Please call the main number to the clinic Dept: [phone] and follow the prompts.   For any non-urgent questions, you may also contact your provider using MyChart. We now offer e-Visits for anyone 18 and older to request care online for non-urgent symptoms. For details visit [email or web address].   Also download the MyChart app! Go to the app store, search "MyChart", open the app, select [facility], and log in with your MyChart username and password.  Due to Covid, a mask is required upon entering the hospital/clinic. If you do not have a mask, one will be given to you upon arrival. For doctor visits, patients may have 1 support person aged 18 or older with them. For treatment visits, patients cannot have anyone with them due to current Covid guidelines and our immunocompromised population.   

## 2020-07-06 ENCOUNTER — Ambulatory Visit: Payer: Medicare Other | Admitting: Radiation Oncology

## 2020-07-11 ENCOUNTER — Other Ambulatory Visit: Payer: Self-pay

## 2020-07-11 ENCOUNTER — Encounter: Payer: Self-pay | Admitting: Oncology

## 2020-07-11 ENCOUNTER — Encounter (HOSPITAL_BASED_OUTPATIENT_CLINIC_OR_DEPARTMENT_OTHER): Payer: Medicare Other | Admitting: Internal Medicine

## 2020-07-11 DIAGNOSIS — C50412 Malignant neoplasm of upper-outer quadrant of left female breast: Secondary | ICD-10-CM | POA: Diagnosis not present

## 2020-07-11 DIAGNOSIS — Z5112 Encounter for antineoplastic immunotherapy: Secondary | ICD-10-CM | POA: Diagnosis not present

## 2020-07-11 DIAGNOSIS — L97829 Non-pressure chronic ulcer of other part of left lower leg with unspecified severity: Secondary | ICD-10-CM

## 2020-07-11 DIAGNOSIS — C44729 Squamous cell carcinoma of skin of left lower limb, including hip: Secondary | ICD-10-CM | POA: Diagnosis not present

## 2020-07-11 DIAGNOSIS — Z853 Personal history of malignant neoplasm of breast: Secondary | ICD-10-CM | POA: Diagnosis not present

## 2020-07-11 DIAGNOSIS — L97819 Non-pressure chronic ulcer of other part of right lower leg with unspecified severity: Secondary | ICD-10-CM

## 2020-07-11 DIAGNOSIS — C4492 Squamous cell carcinoma of skin, unspecified: Secondary | ICD-10-CM | POA: Diagnosis not present

## 2020-07-11 DIAGNOSIS — Z79899 Other long term (current) drug therapy: Secondary | ICD-10-CM | POA: Diagnosis not present

## 2020-07-11 DIAGNOSIS — C44719 Basal cell carcinoma of skin of left lower limb, including hip: Secondary | ICD-10-CM | POA: Diagnosis not present

## 2020-07-11 NOTE — Progress Notes (Signed)
[name] ([id number]) , Visit Report for 07/11/2020 Chief Complaint Document Details Patient Name: Date of Service: [name], [place]. 07/11/2020 1:30 PM Medical Record Number: [id number] Patient Account Number: [id number] Date of Birth/Sex: Treating RN: 05-18-1925 (87 y.o. [name] Primary Care Provider: [name] Other Clinician: Referring Provider: Treating Provider/Extender: [name] in Treatment: 4 Information Obtained from: Patient Chief Complaint Bilateral lower extremity wounds that have been biopsied and positive for squamous cell carcinoma Electronic Signature(s) Signed: 07/11/2020 2:34:12 PM By: Kalman Shan DO Entered By: Kalman Shan on 07/11/2020 14:26:08 -------------------------------------------------------------------------------- HPI Details Patient Name: Date of Service: [name], [name] 07/11/2020 1:30 PM Medical Record Number: [id number] Patient Account Number: [id number] Date of Birth/Sex: Treating RN: [birth year] (87 y.o. [name] Primary Care Provider: [name] Other Clinician: Referring Provider: Treating Provider/Extender: [name] in Treatment: 4 History of Present Illness Location: left leg HPI Description: Admission 5/23 Ms. 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 She states that the wound on the right has been present for a year. The 1 on the left has opened up 1 month ago. She is followed with oncology for this issue as she had biopsies that showed squamous cell carcinoma. She is also seeing radiation oncology for treatment options. She presents today because she would like for her wounds to be healed  by [place]. She currently denies signs of infection. 6/1; patient presents for 1 week follow-up. She states she has tolerated the leg wraps well. She states these do not bother her and is happy to continue with them. She is scheduled to see her oncologist today to go over treatment options for the bilateral lower extremity squamous cell carcinoma. Radiation is currently not a recommended option. Patient states she overall feels well. 6/22; patient presents for 3-week follow-up. She has tolerated the wraps well until her last wrap where she states they were uncomfortable. She attributes this to the home health nurse. She denies signs of infection. She has started her first treatment of antibody infusions for her Bilateral lower extremity squamous cell carcinoma. She has no complaints today. Electronic Signature(s) Signed: 07/11/2020 2:34:12 PM By: Kalman Shan DO Entered By: Kalman Shan on 07/11/2020 14:27:41 -------------------------------------------------------------------------------- Physical Exam Details Patient Name: Date of Service: [name], [place]. 07/11/2020 1:30 PM Medical Record Number: [id number] Patient Account Number: [id number] Date of Birth/Sex: Treating RN: [birth year] (87 y.o. [name] Primary Care Provider: [name] Other Clinician: Referring Provider: Treating Provider/Extender: [name] in Treatment: 4 Constitutional respirations regular, non-labored and within target range for patient.. Cardiovascular 2+ dorsalis pedis/posterior tibialis pulses. Psychiatric pleasant and cooperative. Notes Bilateral lesions to the anterior shins with discoloration and irregular borders, no drainage noted. No signs of infection. Electronic Signature(s) Signed: 07/11/2020 2:34:12 PM By: Kalman Shan DO Entered By: Kalman Shan on 07/11/2020  14:28:28 -------------------------------------------------------------------------------- Physician Orders Details Patient Name: Date of Service: [name], [place]. 07/11/2020 1:30 PM Medical Record Number: [id number] Patient Account Number: [id number] Date of Birth/Sex: Treating RN: 12-16-1925 (87 y.o. [name] Primary Care Provider: [name] Other Clinician: Referring Provider: Treating Provider/Extender: [name] in Treatment: 4 Verbal / Phone Orders: No Diagnosis Coding ICD-10 Coding  Code Description C44.92 Squamous cell carcinoma of skin, unspecified L97.819 Non-pressure chronic ulcer of other part of right lower leg with unspecified severity L97.829 Non-pressure chronic ulcer of other part of left lower leg with unspecified severity I87.2 Venous insufficiency (chronic) (peripheral) Follow-up Appointments Return appointment in 1 month. - with Dr. Heber Waggoner Bathing/ Shower/ Hygiene May shower with protection but do not get wound dressing(s) wet. - Use cast protector Edema Control - Lymphedema / SCD / Other Elevate legs to the level of the heart or above for 30 minutes daily and/or when sitting, a frequency of: - throughout the day Avoid standing for long periods of time. Exercise regularly Additional Orders / Instructions Follow Nutritious Diet Home Health No change in wound care orders this week; continue Home Health for wound care. May utilize formulary equivalent dressing for wound treatment orders unless otherwise specified. Other Home Health Orders/Instructions: - Advanced Wound Treatment Wound #6 - Lower Leg Wound Laterality: Right, Anterior Cleanser: Soap and Water 1 x Per Week/7 Days Discharge Instructions: May shower and wash wound with dial antibacterial soap and water prior to dressing change. Peri-Wound Care: Triamcinolone 15 (g) 1 x Per Week/7 Days Discharge Instructions: Use triamcinolone 15 (g) in clinic  only. Peri-Wound Care: Sween Lotion (Moisturizing lotion) 1 x Per Week/7 Days Discharge Instructions: Apply moisturizing lotion as directed Prim Dressing: KerraCel Ag Gelling Fiber Dressing, 2x2 in (silver alginate) 1 x Per Week/7 Days ary Discharge Instructions: Apply silver alginate to wound bed as instructed Secondary Dressing: Woven Gauze Sponge, Non-Sterile 4x4 in 1 x Per Week/7 Days Discharge Instructions: Apply over primary dressing as directed. Secondary Dressing: ABD Pad, 8x10 1 x Per Week/7 Days Discharge Instructions: Apply over primary dressing as directed. Compression Wrap: Kerlix Roll 4.5x3.1 (in/yd) 1 x Per Week/7 Days Discharge Instructions: Apply Kerlix and Coban compression as directed. Compression Wrap: Coban Self-Adherent Wrap 4x5 (in/yd) 1 x Per Week/7 Days Discharge Instructions: Apply over Kerlix as directed. Wound #7 - Lower Leg Wound Laterality: Left, Anterior Cleanser: Soap and Water 1 x Per Week/7 Days Discharge Instructions: May shower and wash wound with dial antibacterial soap and water prior to dressing change. Peri-Wound Care: Triamcinolone 15 (g) 1 x Per Week/7 Days Discharge Instructions: Use triamcinolone 15 (g) in clinic only. Peri-Wound Care: Sween Lotion (Moisturizing lotion) 1 x Per Week/7 Days Discharge Instructions: Apply moisturizing lotion as directed Prim Dressing: KerraCel Ag Gelling Fiber Dressing, 2x2 in (silver alginate) 1 x Per Week/7 Days ary Discharge Instructions: Apply silver alginate to wound bed as instructed Secondary Dressing: Woven Gauze Sponge, Non-Sterile 4x4 in 1 x Per Week/7 Days Discharge Instructions: Apply over primary dressing as directed. Secondary Dressing: ABD Pad, 8x10 1 x Per Week/7 Days Discharge Instructions: Apply over primary dressing as directed. Compression Wrap: Kerlix Roll 4.5x3.1 (in/yd) 1 x Per Week/7 Days Discharge Instructions: Apply Kerlix and Coban compression as directed. Compression Wrap: Coban  Self-Adherent Wrap 4x5 (in/yd) 1 x Per Week/7 Days Discharge Instructions: Apply over Kerlix as directed. Electronic Signature(s) Signed: 07/11/2020 2:34:12 PM By: Kalman Shan DO Entered By: Kalman Shan on 07/11/2020 14:28:53 -------------------------------------------------------------------------------- Problem List Details Patient Name: Date of Service: [name], [place]. 07/11/2020 1:30 PM Medical Record Number: [id number] Patient Account Number: [id number] Date of Birth/Sex: Treating RN: Mar 18, 1925 (87 y.o. [name] Primary Care Provider: [name] Other Clinician: Referring Provider: Treating Provider/Extender: [name] in Treatment: 4 Active Problems ICD-10 Encounter Code Description Active Date MDM Diagnosis C44.92 Squamous cell carcinoma of skin,  unspecified 06/11/2020 No Yes L97.819 Non-pressure chronic ulcer of other part of right lower leg with unspecified 06/11/2020 No Yes severity L97.829 Non-pressure chronic ulcer of other part of left lower leg with unspecified 06/11/2020 No Yes severity I87.2 Venous insufficiency (chronic) (peripheral) 06/11/2020 No Yes Inactive Problems Resolved Problems Electronic Signature(s) Signed: 07/11/2020 2:34:12 PM By: Kalman Shan DO Entered By: Kalman Shan on 07/11/2020 14:25:55 -------------------------------------------------------------------------------- Progress Note Details Patient Name: Date of Service: [name], [name] [place]. 07/11/2020 1:30 PM Medical Record Number: [id number] Patient Account Number: [id number] Date of Birth/Sex: Treating RN: August 11, 1925 (87 y.o. [name] Primary Care Provider: [name] Other Clinician: Referring Provider: Treating Provider/Extender: [name] in Treatment: 4 Subjective Chief Complaint Information obtained from Patient Bilateral lower extremity wounds that have been  biopsied and positive for squamous cell carcinoma History of Present Illness (HPI) The following HPI elements were documented for the patient's wound: Location: left leg Admission 5/23 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 She states that the wound on the right has been present for a year. The 1 on the left has opened up 1 month ago. She is followed with oncology for this issue as she had biopsies that showed squamous cell carcinoma. She is also seeing radiation oncology for treatment options. She presents today because she would like for her wounds to be healed by [place]. She currently denies signs of infection. 6/1; patient presents for 1 week follow-up. She states she has tolerated the leg wraps well. She states these do not bother her and is happy to continue with them. She is scheduled to see her oncologist today to go over treatment options for the bilateral lower extremity squamous cell carcinoma. Radiation is currently not a recommended option. Patient states she overall feels well. 6/22; patient presents for 3-week follow-up. She has tolerated the wraps well until her last wrap where she states they were uncomfortable. She attributes this to the home health nurse. She denies signs of infection. She has started her first treatment of antibody infusions for her Bilateral lower extremity squamous cell carcinoma. She has no complaints today. Patient History Information obtained from Patient. Family History Unknown History. Social History Never smoker, Marital Status - Single, Alcohol Use - Never, Drug Use - No History, Caffeine Use - Never. Medical History Eyes Denies history of Cataracts, Optic Neuritis Ear/Nose/Mouth/Throat Denies history of Chronic  sinus problems/congestion, Middle ear problems Hematologic/Lymphatic Patient has history of Anemia Denies history of Hemophilia, Human Immunodeficiency Virus, Lymphedema, Sickle Cell Disease Respiratory Denies history of Aspiration, Asthma, Chronic Obstructive Pulmonary Disease (COPD), Pneumothorax, Sleep Apnea, Tuberculosis Cardiovascular Patient has history of Arrhythmia - Atrial Flutter, A fibb, Congestive Heart Failure, Hypertension Denies history of Angina, Coronary Artery Disease, Deep Vein Thrombosis, Hypotension, Myocardial Infarction, Peripheral Arterial Disease, Peripheral Venous Disease, Phlebitis, Vasculitis Gastrointestinal Patient has history of Colitis Denies history of Cirrhosis , Crohnoos, Hepatitis A, Hepatitis B, Hepatitis C Endocrine Denies history of Type I Diabetes, Type II Diabetes Genitourinary Denies history of End Stage Renal Disease Immunological Denies history of Lupus Erythematosus, Raynaudoos, Scleroderma Integumentary (Skin) Denies history of History of Burn Musculoskeletal Patient has history of Osteoarthritis Denies history of Gout, Rheumatoid Arthritis, Osteomyelitis Neurologic Denies history of Dementia, Neuropathy, Quadriplegia, Paraplegia, Seizure Disorder Oncologic Denies history of Received  Chemotherapy, Received Radiation Hospitalization/Surgery History - removal of rod left leg. 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 Vitals Time Taken: 1:19 PM, Height: 65 in, Weight: 163 lbs, BMI: 27.1, Temperature: 98.0 F, Pulse: 85 bpm, Respiratory Rate: 20 breaths/min, Blood Pressure: 115/72 mmHg, Pulse Oximetry: 95 %. Cardiovascular 2+ dorsalis pedis/posterior tibialis pulses. Psychiatric pleasant and  cooperative. General Notes: Bilateral lesions to the anterior shins with discoloration and irregular borders, no drainage noted. No signs of infection. Integumentary (Hair, Skin) Wound #6 status is Open. Original cause of wound was Blister. The date acquired was: 04/20/2020. The wound has been in treatment 4 weeks. The wound is located on the Right,Anterior Lower Leg. The wound measures 4.2cm length x 2.5cm width x 0.2cm depth; 8.247cm^2 area and 1.649cm^3 volume. There is Fat Layer (Subcutaneous Tissue) exposed. There is no tunneling or undermining noted. There is a medium amount of serosanguineous drainage noted. The wound margin is distinct with the outline attached to the wound base. There is small (1-33%) red, pink granulation within the wound bed. There is a large (67-100%) amount of necrotic tissue within the wound bed including Adherent Slough. Wound #7 status is Open. Original cause of wound was Blister. The date acquired was: 04/20/2020. The wound has been in treatment 4 weeks. The wound is located on the Left,Anterior Lower Leg. The wound measures 3cm length x 1.8cm width x 0.2cm depth; 4.241cm^2 area and 0.848cm^3 volume. There is Fat Layer (Subcutaneous Tissue) exposed. There is no tunneling or undermining noted. There is a medium amount of serosanguineous drainage noted. The wound margin is distinct with the outline attached to the wound base. There is small (1-33%) red granulation within the wound bed. There is a large (67-100%) amount of necrotic tissue within the wound bed including Adherent Slough. Assessment Active Problems ICD-10 Squamous cell carcinoma of skin, unspecified Non-pressure chronic ulcer of other part of right lower leg with unspecified severity Non-pressure chronic ulcer of other part of left lower leg with unspecified severity Venous insufficiency (chronic) (peripheral) Patient had her first antibody infusion of cemiplimab/ Libtayo on 07/04/2020 And plan is to  assess after 4 treatments. Overall the wounds are stable and the appearance is slightly improved. I think she is doing fine with silver alginate and Kerlix/Coban weekly. She will continue this with home health weekly and I will see her every 4 weeks Plan Follow-up Appointments: Return appointment in 1 month. - with Dr. Heber Conway Bathing/ Shower/ Hygiene: May shower with protection but do not get wound dressing(s) wet. - Use cast protector Edema Control - Lymphedema / SCD / Other: Elevate legs to the level of the heart or above for 30 minutes daily and/or when sitting, a frequency of: - throughout the day Avoid standing for long periods of time. Exercise regularly Additional Orders / Instructions: Follow Nutritious Diet Home Health: No change in wound care orders this week; continue Home Health for wound care. May utilize formulary equivalent dressing for wound treatment orders unless otherwise specified. Other Home Health Orders/Instructions: - Advanced WOUND #6: - Lower Leg Wound Laterality: Right, Anterior Cleanser: Soap and Water 1 x Per Week/7 Days Discharge Instructions: May shower and wash wound with dial antibacterial soap and water prior to dressing change. Peri-Wound Care: Triamcinolone 15 (g) 1 x Per Week/7 Days Discharge Instructions: Use triamcinolone 15 (g) in clinic only. Peri-Wound Care: Sween Lotion (Moisturizing lotion) 1  x Per Week/7 Days Discharge Instructions: Apply moisturizing lotion as directed Prim Dressing: KerraCel Ag Gelling Fiber Dressing, 2x2 in (silver alginate) 1 x Per Week/7 Days ary Discharge Instructions: Apply silver alginate to wound bed as instructed Secondary Dressing: Woven Gauze Sponge, Non-Sterile 4x4 in 1 x Per Week/7 Days Discharge Instructions: Apply over primary dressing as directed. Secondary Dressing: ABD Pad, 8x10 1 x Per Week/7 Days Discharge Instructions: Apply over primary dressing as directed. Com pression Wrap: Kerlix Roll 4.5x3.1  (in/yd) 1 x Per Week/7 Days Discharge Instructions: Apply Kerlix and Coban compression as directed. Com pression Wrap: Coban Self-Adherent Wrap 4x5 (in/yd) 1 x Per Week/7 Days Discharge Instructions: Apply over Kerlix as directed. WOUND #7: - Lower Leg Wound Laterality: Left, Anterior Cleanser: Soap and Water 1 x Per Week/7 Days Discharge Instructions: May shower and wash wound with dial antibacterial soap and water prior to dressing change. Peri-Wound Care: Triamcinolone 15 (g) 1 x Per Week/7 Days Discharge Instructions: Use triamcinolone 15 (g) in clinic only. Peri-Wound Care: Sween Lotion (Moisturizing lotion) 1 x Per Week/7 Days Discharge Instructions: Apply moisturizing lotion as directed Prim Dressing: KerraCel Ag Gelling Fiber Dressing, 2x2 in (silver alginate) 1 x Per Week/7 Days ary Discharge Instructions: Apply silver alginate to wound bed as instructed Secondary Dressing: Woven Gauze Sponge, Non-Sterile 4x4 in 1 x Per Week/7 Days Discharge Instructions: Apply over primary dressing as directed. Secondary Dressing: ABD Pad, 8x10 1 x Per Week/7 Days Discharge Instructions: Apply over primary dressing as directed. Com pression Wrap: Kerlix Roll 4.5x3.1 (in/yd) 1 x Per Week/7 Days Discharge Instructions: Apply Kerlix and Coban compression as directed. Com pression Wrap: Coban Self-Adherent Wrap 4x5 (in/yd) 1 x Per Week/7 Days Discharge Instructions: Apply over Kerlix as directed. 1. Silver alginate with Kerlix/Coban 2. Follow-up in 4 weeks Electronic Signature(s) Signed: 07/11/2020 2:34:12 PM By: Kalman Shan DO Entered By: Kalman Shan on 07/11/2020 14:33:21 -------------------------------------------------------------------------------- HxROS Details Patient Name: Date of Service: [name], [place]. 07/11/2020 1:30 PM Medical Record Number: [id number] Patient Account Number: [id number] Date of Birth/Sex: Treating RN: [birth year] (87 y.o. [name] Primary Care  Provider: [name] Other Clinician: Referring Provider: Treating Provider/Extender: [name] in Treatment: 4 Information Obtained From Patient Eyes Medical History: Negative for: Cataracts; Optic Neuritis Ear/Nose/Mouth/Throat Medical History: Negative for: Chronic sinus problems/congestion; Middle ear problems Hematologic/Lymphatic Medical History: Positive for: Anemia Negative for: Hemophilia; Human Immunodeficiency Virus; Lymphedema; Sickle Cell Disease Respiratory Medical History: Negative for: Aspiration; Asthma; Chronic Obstructive Pulmonary Disease (COPD); Pneumothorax; Sleep Apnea; Tuberculosis Cardiovascular Medical History: Positive for: Arrhythmia - Atrial Flutter, A fibb; Congestive Heart Failure; Hypertension Negative for: Angina; Coronary Artery Disease; Deep Vein Thrombosis; Hypotension; Myocardial Infarction; Peripheral Arterial Disease; Peripheral Venous Disease; Phlebitis; Vasculitis Past Medical History Notes: hyperlipidemia Gastrointestinal Medical History: Positive for: Colitis Negative for: Cirrhosis ; Crohns; Hepatitis A; Hepatitis B; Hepatitis C Endocrine Medical History: Negative for: Type I Diabetes; Type II Diabetes Past Medical History Notes: hypothyroidism Genitourinary Medical History: Negative for: End Stage Renal Disease Immunological Medical History: Negative for: Lupus Erythematosus; Raynauds; Scleroderma Integumentary (Skin) Medical History: Negative for: History of Burn Musculoskeletal Medical History: Positive for: Osteoarthritis Negative for: Gout; Rheumatoid Arthritis; Osteomyelitis Neurologic Medical History: Negative for: Dementia; Neuropathy; Quadriplegia; Paraplegia; Seizure Disorder Past Medical History Notes: lumbar spindylolysis Oncologic Medical History: Negative for: Received Chemotherapy; Received Radiation Past Medical History Notes: BLE squamous ceel  carcionoma Immunizations Pneumococcal Vaccine: Received Pneumococcal Vaccination: Yes Implantable Devices None Hospitalization / Surgery History Type of Hospitalization/Surgery  removal of rod left leg Family and Social History Unknown History: Yes; Never smoker; Marital Status - Single; Alcohol Use: Never; Drug Use: No History; Caffeine Use: Never; Financial Concerns: No; Food, Clothing or Shelter Needs: No; Support System Lacking: No; Transportation Concerns: No Electronic Signature(s) Signed: 07/11/2020 2:34:12 PM By: Kalman Shan DO Signed: 07/11/2020 5:46:25 PM By: Levan Hurst RN, BSN Entered By: Kalman Shan on 07/11/2020 14:27:52 -------------------------------------------------------------------------------- [place] Details Patient Name: Date of Service: [name], [name] 07/11/2020 Medical Record Number: [id number] Patient Account Number: [id number] Date of Birth/Sex: Treating RN: 1925-12-24 (87 y.o. [name] Primary Care Provider: [name] Other Clinician: Referring Provider: Treating Provider/Extender: [name] in Treatment: 4 Diagnosis Coding ICD-10 Codes Code Description C44.92 Squamous cell carcinoma of skin, unspecified L97.819 Non-pressure chronic ulcer of other part of right lower leg with unspecified severity L97.829 Non-pressure chronic ulcer of other part of left lower leg with unspecified severity I87.2 Venous insufficiency (chronic) (peripheral) Facility Procedures CPT4 Code: [id number] Description: 02217 - WOUND CARE VISIT-LEV 4 EST PT Modifier: Quantity: 1 Physician Procedures : CPT4 Code Description Modifier [id number] - WC PHYS LEVEL 3 - EST PT 1 ICD-10 Diagnosis Description C44.92 Squamous cell carcinoma of skin, unspecified L97.819 Non-pressure chronic ulcer of other part of right lower leg with unspecified severity  L97.829 Non-pressure chronic ulcer of other part of left lower  leg with unspecified severity Quantity: Electronic Signature(s) Signed: 07/11/2020 2:34:12 PM By: Kalman Shan DO Entered By: Kalman Shan on 07/11/2020 14:33:40

## 2020-07-11 NOTE — Progress Notes (Signed)
[name]  [phone] [phone]     ID: [name] DOB: 11-12-1925  MR#: [id number]  IOE#:[id number]  Patient Care Team: [name], MD as PCP - General Angelena Form Annita Brod, MD as PCP - Cardiology (Cardiology) Rolm Bookbinder, MD as Consulting Physician (General Surgery) Fender Herder, Virgie Dad, MD as Consulting Physician (Oncology) Burnell Blanks, MD as Consulting Physician (Cardiology) Jarome Matin, MD as Consulting Physician (Dermatology) Marlou Sa Tonna Corner, MD as Consulting Physician (Orthopedic Surgery) Cameron Sprang, MD as Consulting Physician (Neurology) OTHER MD:  I connected with [name] on 07/12/20 at 11:00 AM EDT by video enabled telemedicine visit and verified that I am speaking with the correct person using two identifiers.   I discussed the limitations, risks, security and privacy concerns of performing an evaluation and management service by telemedicine and the availability of in-person appointments. I also discussed with the patient that there may be a patient responsible charge related to this service. The patient expressed understanding and agreed to proceed.   Other persons participating in the visit and their role in the encounter: Patient's granddaughter  Patient's location: Home Provider's location: [place]  Total time spent: 20 min   CHIEF COMPLAINT: Estrogen receptor positive invasive breast cancer  CURRENT TREATMENT: cemiplimab/ Libtayo   INTERVAL HISTORY: [name] was contacted today for follow-up of her new skin cancer.  Her granddaughter participated in this visit.  [name] began systemic therapy with cemiplimab at her last visit on 07/04/2020. She has been experiencing diarrhea since infusion. Her daughter submitted photos of [name]'s legs via MyChart yesterday, 07/11/2020.  These can be reviewed under encounters/patient message dated 07/11/2020.  The last mammogram we  have records for is from July 2019 at [name] showing a density category A.  [name] tells me there has been too much on her plate and she has neglected her health to some extent.   REVIEW OF SYSTEMS: [name] tells me she had 3 diarrhea bowel movements yesterday.  1 at least was very watery and the other 2 were loose.  She is not taking anything for this at present.  She is not having nausea or vomiting, rash, fatigue, fever, or any other side effects that she is aware of related to the tumor.  She did have pain in her legs 2 nights ago.  This was associated with the areas of concern with squamous cell cancer.  A detailed review of systems today was otherwise stable   COVID 19 VACCINATION STATUS: fully vaccinated [employer]), with booster 10/2019   BREAST CANCER HISTORY: From the original intake note:  Arianie noted a change in her left breast and had bilateral screening mammography at [facility] 06/07/2015, with tomosynthesis. Breast density was category A. There was a new oval mass in the left breast central to the nipple. Left breast ultrasound the same day confirmed a 3.4 cm oval mass in the left breast upper outer quadrant, correlating with the mammography. The left axilla was sonographically benign.  Biopsy of the left breast mass in question 06/11/2015 showed ([id number]) a papillary lesion, consistent with an intraductal papilloma, but due to fragmentation the edges of the lesion could not be assessed.  Accordingly the patient was referred to surgery, and after appropriate discussion underwent left lumpectomy 07/25/2015. The final pathology ([id number]) showed 2 foci of invasive ductal carcinoma, grade 2, measuring 3.0 cm, and grade 1, measuring 0.9 cm. There was also ductal carcinoma in situ. The invasive tumor was present  at the superior margin, and ductal carcinoma in situ was focally less than 0.1 cm to the same margin. There was evidence of lymphovascular invasion. The prognostic panel showed estrogen  receptor 100% positive, progesterone receptor 100% positive, both with strong staining intensity, for both lesions. HER-2 was also negative for both lesions, the signals ratio being 1.45-1.65, and the number per cell 2.10-2.80.  The patient's subsequent history is as detailed below   PAST MEDICAL HISTORY: Past Medical History:  Diagnosis Date   Anxiety    denies   Atrial flutter ([place])    Bilateral edema of lower extremity    Breast cancer ([place])    left breast   Chronic constipation    Chronic diastolic CHF (congestive heart failure) (HCC)    Diverticulosis of colon    GERD (gastroesophageal reflux disease)    H/O hiatal hernia    History of cellulitis    LEFT LOWER LEG --  SEPT 2015   History of colitis    DX  2003  WITH ISCHEMIC COLITIS   History of diverticulitis of colon    perferated diverticulitis w/ abscess 08-25-2013 drain placed --  resolved without surgical intervention   History of GI bleed    2011--- UPPER SECONARY GASTRIC ULCER FROM NSAID USE   History of Helicobacter pylori infection    2011   History of ulcer of lower limb    2012   RIGHT LOWER EXTREMITIY--  STATSIS ULCER   Hyperlipidemia    Hypertension    Hypothyroidism    Iron deficiency anemia 2011   elevated MCV   LBP (low back pain)    severe lumbar spondylosis s/p L3/L4,L4/L5 fusion 12/10   Lumbar spondylolysis    OA (osteoarthritis)    "hands" (08/25/2013)   Open wound of left lower leg    Osteopenia    Persistent atrial fibrillation ([place])    CARDIOLOGIST-   DR MCALANY   RBBB    Urinary frequency     PAST SURGICAL HISTORY: Past Surgical History:  Procedure Laterality Date   APPLICATION OF A-CELL OF EXTREMITY Left 06/21/2013   Procedure: APPLICATION OF A-CELL OF EXTREMITY;  Surgeon: Theodoro Kos, DO;  Location: [place];  Service: Plastics;  Laterality: Left;   APPLICATION OF A-CELL OF EXTREMITY Left 08/22/2013   Procedure: APPLICATION OF A-CELL/VAC TO LOWER LEFT LEG WOUND;  Surgeon: Theodoro Kos,  DO;  Location: [place];  Service: Plastics;  Laterality: Left;   APPLICATION OF A-CELL OF EXTREMITY Left 02/06/2014   Procedure: WITH PLACEMENT OF A -CELL ;  Surgeon: Theodoro Kos, DO;  Location: [place];  Service: Plastics;  Laterality: Left;   APPLICATION OF A-CELL OF EXTREMITY Left 08/09/2014   Procedure: PLACEMENT OF A CELL;  Surgeon: Theodoro Kos, DO;  Location: [place];  Service: Plastics;  Laterality: Left;   APPLICATION OF WOUND VAC Left 06/16/2013   Procedure: APPLICATION OF WOUND VAC;  Surgeon: Meredith Pel, MD;  Location: [place];  Service: Orthopedics;  Laterality: Left;   APPLICATION OF WOUND VAC Left 06/21/2013   Procedure: APPLICATION OF WOUND VAC;  Surgeon: Theodoro Kos, DO;  Location: [place];  Service: Plastics;  Laterality: Left;   BIOPSY BREAST  07/25/2015   LEFT RADIOACTIVE SEED GUIDED EXCISIONAL BREAST BIOPSY (Left) as a surgical intervention   EXTERNAL FIXATION LEG Left 06/16/2013   Procedure: EXTERNAL FIXATION LEG;  Surgeon: Meredith Pel, MD;  Location: [place];  Service: Orthopedics;  Laterality: Left;   EXTERNAL  FIXATION REMOVAL Left 06/21/2013   Procedure: REMOVAL EXTERNAL FIXATION LEG;  Surgeon: Meredith Pel, MD;  Location: [place];  Service: Orthopedics;  Laterality: Left;   EYE SURGERY Bilateral    cataract removal   HARDWARE REMOVAL Left 05/23/2014   Procedure: HARDWARE REMOVAL;  Surgeon: Meredith Pel, MD;  Location: [place];  Service: Orthopedics;  Laterality: Left;  REMOVAL OF HARDWARE LEFT TIBIA   I & D EXTREMITY Left 06/16/2013   Procedure: IRRIGATION AND DEBRIDEMENT EXTREMITY;  Surgeon: Meredith Pel, MD;  Location: [place];  Service: Orthopedics;  Laterality: Left;   I & D EXTREMITY Left 02/06/2014   Procedure: IRRIGATION AND DEBRIDEMENT LEFT LEG WOUND ;  Surgeon: Theodoro Kos, DO;  Location: [place];  Service: Plastics;  Laterality: Left;   I & D EXTREMITY Left 08/09/2014   Procedure:  IRRIGATION AND DEBRIDEMENT  OF LEFT LOWER LEG;  Surgeon: Theodoro Kos, DO;  Location: [place];  Service: Plastics;  Laterality: Left;   LUMBAR FUSION  01-02-2009   L3-L5   RADIOACTIVE SEED GUIDED EXCISIONAL BREAST BIOPSY Left 07/25/2015   Procedure: LEFT RADIOACTIVE SEED GUIDED EXCISIONAL BREAST BIOPSY;  Surgeon: Rolm Bookbinder, MD;  Location: [place];  Service: General;  Laterality: Left;   RE-EXCISION OF BREAST LUMPECTOMY Left 09/05/2015   Procedure: RE-EXCISION OF LEFT BREAST LUMPECTOMY;  Surgeon: Rolm Bookbinder, MD;  Location: [place];  Service: General;  Laterality: Left;   SHOULDER OPEN ROTATOR CUFF REPAIR Left 1997   TIBIA IM NAIL INSERTION Left 06/21/2013   Procedure: INTRAMEDULLARY (IM) NAIL TIBIAL;  Surgeon: Meredith Pel, MD;  Location: [place];  Service: Orthopedics;  Laterality: Left;   TONSILLECTOMY  AS CHILD   TOTAL HIP ARTHROPLASTY Right 06-07-2010   TRANSTHORACIC ECHOCARDIOGRAM  09-11-2010   DR MCALHANY   LVSF  55-60%/  MILD MV CALCIFICATION WITH NO STENOSIS/  MILD MR & TR / MODERATE LAE/  MODERATE TO SEVERE RAE   UPPER GI ENDOSCOPY  03-29-2010    FAMILY HISTORY Family History  Problem Relation Age of Onset   Cancer Father    Hypertension Other   The patient's father died at the age of 5 from heart problems. The patient's mother died at the age of 28 with a ruptured aneurysm. The patient had no brothers, 1 sister. There is no history of breast or ovarian cancer in the family.    GYNECOLOGIC HISTORY:  No LMP recorded. Patient is postmenopausal. Menarche age 65, first live birth age 40. The patient is GX P2. She stopped having periods in her early 72s. She did not take hormone replacement. She used oral contraceptives remotely for approximately 2 years, with no complications.   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 Her husband [name] was a Airline pilot.  According to the patient there are significant cognitive limitations on his side.  They  currently live at [facility]. Daughter [name] lives in [place], near [place]. With her husband [name]. Daughter [name] lives in [place]. The patient has 2 grandchildren and 4 great-grandchildren. She is a Psychologist, forensic.   ADVANCED DIRECTIVES: The patient has a living will in place and she has named her granddaughter [name] as her healthcare part of attorney. [name] can be reached at [place]: Social History   Tobacco Use   Smoking status: Never   Smokeless tobacco: Never  Vaping Use   Vaping Use: Never used  Substance Use Topics  Alcohol use: No    Comment: 1 glass occ wine 08/03/14- none in a year   Drug use: No     Colonoscopy: Laurence Spates, remote  PAP:  Bone density:   Allergies  Allergen Reactions   Zosyn [Piperacillin Sod-Tazobactam So] Hives, Itching, Rash and Other (See Comments)    Morbilliform eruptions with itching- looks like Measles   Atorvastatin     myalgias   Atenolol Nausea And Vomiting   Clarithromycin Nausea And Vomiting   Codeine Sulfate Nausea Only   Levaquin [Levofloxacin] Nausea Only   Macrodantin Other (See Comments)    UNSPECIFIED    Oxycodone-Acetaminophen Nausea And Vomiting    Current Outpatient Medications  Medication Sig Dispense Refill   acetaminophen (TYLENOL) 500 MG tablet Take 500 mg by mouth daily as needed for mild pain.      apixaban (ELIQUIS) 5 MG TABS tablet Take 1 tablet (5 mg total) by mouth 2 (two) times daily. 180 tablet 3   B Complex-C (B-COMPLEX WITH VITAMIN C) tablet Take 1 tablet by mouth daily.     busPIRone (BUSPAR) 15 MG tablet Take 1 tablet (15 mg total) by mouth 2 (two) times daily. 180 tablet 11   Cholecalciferol (VITAMIN D3) 1000 UNITS tablet Take 1,000 Units by mouth daily.     colestipol (COLESTID) 1 g tablet Take 1 tablet (1 g total) by mouth 2 (two) times daily. 60 tablet 12   diltiazem (CARDIZEM CD) 120 MG 24 hr capsule TAKE 1 CAPSULE (120 MG TOTAL) BY MOUTH DAILY.  90 capsule 3   doxycycline (VIBRA-TABS) 100 MG tablet Take 1 tablet (100 mg total) by mouth daily. 90 tablet 3   famotidine (PEPCID) 20 MG tablet Take 20 mg by mouth daily.     gabapentin (NEURONTIN) 100 MG capsule Take 1 capsule (100 mg total) by mouth at bedtime. Pain 90 capsule 3   levothyroxine (SYNTHROID) 25 MCG tablet TAKE 1 TABLET BY MOUTH DAILY FOR THYROID. 30 tablet 11   loperamide (IMODIUM) 2 MG capsule Take 1 capsule (2 mg total) by mouth as needed for diarrhea or loose stools. Take 1 tab with first loose stool then one tab after each loose stool for maximum dose of 8 tabs daily. Pt to call the office if need for greater then 8 tabs a day 60 capsule 0   metoprolol succinate (TOPROL-XL) 50 MG 24 hr tablet Take 0.5 tablets (25 mg total) by mouth 2 (two) times daily. Take with or immediately following a meal. 180 tablet 3   Multiple Vitamins-Minerals (MULTIVITAMIN WITH MINERALS) tablet Take 1 tablet by mouth daily.     polyethylene glycol (MIRALAX) 17 g packet Take 17 g by mouth daily. 30 each 0   prochlorperazine (COMPAZINE) 5 MG tablet Take 1 tablet (5 mg total) by mouth every 8 (eight) hours as needed for nausea or vomiting. 20 tablet 1   temazepam (RESTORIL) 15 MG capsule TAKE 1 CAPSULE BY MOUTH EVERY NIGHT AT BEDTIME AS NEEDED FOR SLEEP 60 capsule 3   No current facility-administered medications for this visit.    OBJECTIVE: White woman who appears stated age  There were no vitals filed for this visit.    There is no height or weight on file to calculate BMI.     ECOG FS:1 - Symptomatic but completely ambulatory  Telemedicine visit 07/12/2020  Review of the images on "my chart" from yesterday show the lesions to be well-circumscribed with no suppuration swelling or worsening erythema.   [facility]    LAB RESULTS:  CMP     Component Value Date/Time   NA 139 07/04/2020 1309   NA 134 08/11/2018 1425   NA 137 07/28/2016 1334   K 3.6  07/04/2020 1309   K 3.8 07/28/2016 1334   CL 102 07/04/2020 1309   CO2 27 07/04/2020 1309   CO2 28 07/28/2016 1334   GLUCOSE 122 (H) 07/04/2020 1309   GLUCOSE 160 (H) 07/28/2016 1334   BUN 17 07/04/2020 1309   BUN 20 08/11/2018 1425   BUN 9.5 07/28/2016 1334   CREATININE 1.13 (H) 07/04/2020 1309   CREATININE 1.0 07/28/2016 1334   CALCIUM 9.7 07/04/2020 1309   CALCIUM 9.6 07/28/2016 1334   PROT 7.6 07/04/2020 1309   PROT 7.4 07/28/2016 1334   ALBUMIN 3.6 07/04/2020 1309   ALBUMIN 4.0 07/28/2016 1334   AST 20 07/04/2020 1309   AST 21 07/28/2016 1334   ALT 10 07/04/2020 1309   ALT 24 07/28/2016 1334   ALKPHOS 78 07/04/2020 1309   ALKPHOS 96 07/28/2016 1334   BILITOT 1.1 07/04/2020 1309   BILITOT 1.21 (H) 07/28/2016 1334   GFRNONAA 46 (L) 07/04/2020 1309   GFRAA 50 (L) 10/03/2019 1355    INo results found for: SPEP, UPEP  Lab Results  Component Value Date   WBC 5.7 07/04/2020   NEUTROABS 3.4 07/04/2020   HGB 12.5 07/04/2020   HCT 38.4 07/04/2020   MCV 105.5 (H) 07/04/2020   PLT 206 07/04/2020      Chemistry      Component Value Date/Time   NA 139 07/04/2020 1309   NA 134 08/11/2018 1425   NA 137 07/28/2016 1334   K 3.6 07/04/2020 1309   K 3.8 07/28/2016 1334   CL 102 07/04/2020 1309   CO2 27 07/04/2020 1309   CO2 28 07/28/2016 1334   BUN 17 07/04/2020 1309   BUN 20 08/11/2018 1425   BUN 9.5 07/28/2016 1334   CREATININE 1.13 (H) 07/04/2020 1309   CREATININE 1.0 07/28/2016 1334      Component Value Date/Time   CALCIUM 9.7 07/04/2020 1309   CALCIUM 9.6 07/28/2016 1334   ALKPHOS 78 07/04/2020 1309   ALKPHOS 96 07/28/2016 1334   AST 20 07/04/2020 1309   AST 21 07/28/2016 1334   ALT 10 07/04/2020 1309   ALT 24 07/28/2016 1334   BILITOT 1.1 07/04/2020 1309   BILITOT 1.21 (H) 07/28/2016 1334       No results found for: LABCA2  No components found for: LABCA125  No results for input(s): INR in the last 168 hours.  Urinalysis    Component Value  Date/Time   COLORURINE YELLOW 10/07/2018 1535   APPEARANCEUR CLEAR 10/07/2018 1535   LABSPEC 1.012 10/07/2018 1535   PHURINE 6.0 10/07/2018 1535   GLUCOSEU NEGATIVE 10/07/2018 1535   GLUCOSEU NEGATIVE 01/17/2015 1415   HGBUR NEGATIVE 10/07/2018 1535   [place] 10/07/2018 1535   [place] 10/07/2018 1535   PROTEINUR 30 (A) 10/07/2018 1535   UROBILINOGEN 0.2 01/17/2015 1415   NITRITE NEGATIVE 10/07/2018 1535   LEUKOCYTESUR NEGATIVE 10/07/2018 1535    STUDIES: VAS [place] ABI WITH/WO TBI  Result Date: 06/14/2020  LOWER EXTREMITY DOPPLER STUDY Patient Name:  [name]  Date of Exam:   06/14/2020 Medical Rec #: [id number]      Accession #:    [id number] Date of Birth: March 01, 1925     Patient Gender: F Patient Age:   38Y Exam Location:  [facility] Procedure:      VAS [place] ABI WITH/WO TBI Referring Phys: [id number] JESSICA RATLIFF HOFFMAN --------------------------------------------------------------------------------  Indications: Ulceration. High Risk Factors: Hypertension, no history of smoking.  Vascular Interventions: Chronic venous insufficiency. Limitations: Today's exam was limited due to bandages and an open wound. Performing Technologist: Delorise Shiner RVT  Examination Guidelines: A complete evaluation includes at minimum, Doppler waveform signals and systolic blood pressure reading at the level of bilateral brachial, anterior tibial, and posterior tibial arteries, when vessel segments are accessible. Bilateral testing is considered an integral part of a complete examination. Photoelectric Plethysmograph (PPG) waveforms and toe systolic pressure readings are included as required and additional duplex testing as needed. Limited examinations for reoccurring indications may be performed as noted.  ABI Findings: +---------+------------------+-----+---------+--------+ Right    Rt Pressure (mmHg)IndexWaveform Comment   +---------+------------------+-----+---------+--------+ Brachial 165                                      +---------+------------------+-----+---------+--------+ ATA      215               1.30                   +---------+------------------+-----+---------+--------+ PTA      191               1.16 triphasic         +---------+------------------+-----+---------+--------+ DP                              biphasic          +---------+------------------+-----+---------+--------+ Great Toe135               0.82                   +---------+------------------+-----+---------+--------+ +---------+------------------+-----+----------+-------+ Left     Lt Pressure (mmHg)IndexWaveform  Comment +---------+------------------+-----+----------+-------+ Brachial 164                                      +---------+------------------+-----+----------+-------+ ATA      187               1.13                   +---------+------------------+-----+----------+-------+ PTA      204               1.24 triphasic         +---------+------------------+-----+----------+-------+ DP                              monophasic        +---------+------------------+-----+----------+-------+ Great Toe160               0.97                   +---------+------------------+-----+----------+-------+ +-------+-----------+-----------+------------+------------+ ABI/TBIToday's ABIToday's TBIPrevious ABIPrevious TBI +-------+-----------+-----------+------------+------------+ Right  1.30       0.82                                +-------+-----------+-----------+------------+------------+ Left   1.24       0.97                                +-------+-----------+-----------+------------+------------+  Summary: Right: Resting right ankle-brachial index is within normal range. No evidence of significant right lower extremity arterial disease. The right toe-brachial index is normal.  RT great toe pressure = 135 mmHg. Left: Resting left ankle-brachial index is within normal range. No evidence of significant left lower extremity arterial disease. The left toe-brachial index is normal. LT Great toe pressure = 160 mmHg.  *See table(s) above for measurements and observations.  Electronically signed by Deitra Mayo MD on 06/14/2020 at 4:34:25 PM.    Final       ELIGIBLE FOR AVAILABLE RESEARCH PROTOCOL: no  ASSESSMENT: 87 y.o. [place] woman status post left breast upper outer quadrant biopsy 06/11/2015 for a papillary lesion with unclear margins.  (1) status post left lumpectomy 07/25/2015 for an mpT2 cN0, stage IIA  invasive ductal carcinoma,  grade I-II  estrogen and progesterone receptor strongly positive, HER-2 not amplified   (a) additional surgery 09/05/2015 found some residual ductal carcinoma in situ but the margins were clear.  (2) no sentinel lymph node sampling or adjuvant radiation planned  (3) adjuvant anastrozole started 08/10/2015, completed May 2022  (4) squamous cell carcinoma of the skin in both lower extremities  (a) to start cemiplimab/ Libtayo 07/04/2020, repeated every 21 days x 8  (b) consider cetuximab if Libtayo ineffective or not tolerated   PLAN:  [name] did generally well with her first dose of cemiplimab except for the expected problem with diarrhea.  We discussed this at length today.  She is going to take colestipol 1 g tablet twice daily and then she will add Imodium as needed.  She will take 1 Imodium tablet after each diarrheal bowel movement.  If she becomes constipated and does not have a bowel movement for 2 days she will call and let us know.  Otherwise she is scheduled for her second dose on 07/26/2020.  She will see me in the same day.  I am hoping to get at least 4 doses of the antibody in before deciding whether or not it is working.  I am also hoping to have the diarrhea under control by that time  Total encounter time 20  minutes.*  [name], Virgie Dad, MD  07/12/20 6:27 PM Medical Oncology and Hematology [facility] [place], [place] [address] Tel. [phone]    Fax. [phone]   I, Wilburn Mylar, am acting as scribe for Dr. Virgie Dad. [name].  I, Lurline Del MD, have reviewed the above documentation for accuracy and completeness, and I agree with the above.   *Total Encounter Time as defined by the Centers for Medicare and Medicaid Services includes, in addition to the face-to-face time of a patient visit (documented in the note above) non-face-to-face time: obtaining and reviewing outside history, ordering and reviewing medications, tests or procedures, care coordination (communications with other health care professionals or caregivers) and documentation in the medical record.

## 2020-07-12 ENCOUNTER — Telehealth (HOSPITAL_BASED_OUTPATIENT_CLINIC_OR_DEPARTMENT_OTHER): Payer: Medicare Other | Admitting: Oncology

## 2020-07-12 ENCOUNTER — Other Ambulatory Visit: Payer: Self-pay | Admitting: *Deleted

## 2020-07-12 DIAGNOSIS — Z170 Estrogen receptor positive status [ER+]: Secondary | ICD-10-CM | POA: Diagnosis not present

## 2020-07-12 DIAGNOSIS — C50412 Malignant neoplasm of upper-outer quadrant of left female breast: Secondary | ICD-10-CM

## 2020-07-12 DIAGNOSIS — C44721 Squamous cell carcinoma of skin of unspecified lower limb, including hip: Secondary | ICD-10-CM | POA: Diagnosis not present

## 2020-07-12 MED ORDER — COLESTIPOL HCL 1 G PO TABS: 1.0000 g | ORAL_TABLET | Freq: Two times a day (BID) | ORAL | 12 refills | Status: DC

## 2020-07-12 MED ORDER — LOPERAMIDE HCL 2 MG PO CAPS: 2.0000 mg | ORAL_CAPSULE | ORAL | 0 refills | Status: DC | PRN

## 2020-07-12 MED ORDER — COLESTIPOL HCL 1 G PO TABS
4.0000 g | ORAL_TABLET | Freq: Two times a day (BID) | ORAL | 12 refills | Status: DC
Start: 2020-07-12 — End: 2020-07-12

## 2020-07-12 NOTE — Progress Notes (Signed)
MILLETTE HALBERSTAM ([id number]) , Visit Report for 07/11/2020 Arrival Information Details Patient Name: Date of Service: [name], [place]. 07/11/2020 1:30 PM Medical Record Number: [id number] Patient Account Number: [id number] Date of Birth/Sex: Treating RN: Feb 26, 1925 (87 y.o. Nita Sells Primary Care Dezmen Alcock: [name] Other Clinician: Referring Kelcey Wickstrom: Treating Zebbie Ace/Extender: [name] in Treatment: 4 Visit Information History Since Last Visit All ordered tests and consults were completed: No Patient Arrived: [name] Added or deleted any medications: No Arrival Time: 13:14 Any new allergies or adverse reactions: No Accompanied By: granddaughter Had a fall or experienced change in No Transfer Assistance: None activities of daily living that may affect Patient Identification Verified: Yes risk of falls: Secondary Verification Process Completed: Yes Signs or symptoms of abuse/neglect since last visito No Patient Requires Transmission-Based Precautions: No Hospitalized since last visit: No Patient Has Alerts: Yes Implantable device outside of the clinic excluding No Patient Alerts: R ABI = Non compressible cellular tissue based products placed in the center L ABI = Non compressible since last visit: Has Dressing in Place as Prescribed: Yes Has Compression in Place as Prescribed: Yes Pain Present Now: No Electronic Signature(s) Signed: 07/11/2020 1:15:40 PM By: Maye Hides Signed: 07/12/2020 8:38:46 AM By: Leane Call Entered By: Leane Call on 07/11/2020 13:15:39 -------------------------------------------------------------------------------- Clinic Level of Care Assessment Details Patient Name: Date of Service: [name], Carolie A. 07/11/2020 1:30 PM Medical Record Number: [id number] Patient Account Number: [id number] Date of Birth/Sex: Treating RN: [birth year] (87 y.o. [name] Primary Care Floy Angert:  [name] Other Clinician: Referring Kayona Foor: Treating Deavin Forst/Extender: [name] in Treatment: 4 Clinic Level of Care Assessment Items TOOL 4 Quantity Score X- 1 0 Use when only an EandM is performed on FOLLOW-UP visit ASSESSMENTS - Nursing Assessment / Reassessment X- 1 10 Reassessment of Co-morbidities (includes updates in patient status) X- 1 5 Reassessment of Adherence to Treatment Plan ASSESSMENTS - Wound and Skin A ssessment / Reassessment []  - 0 Simple Wound Assessment / Reassessment - one wound X- 2 5 Complex Wound Assessment / Reassessment - multiple wounds []  - 0 Dermatologic / Skin Assessment (not related to wound area) ASSESSMENTS - Focused Assessment []  - 0 Circumferential Edema Measurements - multi extremities []  - 0 Nutritional Assessment / Counseling / Intervention X- 1 5 Lower Extremity Assessment (monofilament, tuning fork, pulses) []  - 0 Peripheral Arterial Disease Assessment (using hand held doppler) ASSESSMENTS - Ostomy and/or Continence Assessment and Care []  - 0 Incontinence Assessment and Management []  - 0 Ostomy Care Assessment and Management (repouching, etc.) PROCESS - Coordination of Care X - Simple Patient / Family Education for ongoing care 1 15 []  - 0 Complex (extensive) Patient / Family Education for ongoing care X- 1 10 Staff obtains Programmer, systems, Records, T Results / Process Orders est X- 1 10 Staff telephones HHA, Nursing Homes / Clarify orders / etc []  - 0 Routine Transfer to another Facility (non-emergent condition) []  - 0 Routine Hospital Admission (non-emergent condition) []  - 0 New Admissions / Biomedical engineer / Ordering NPWT Apligraf, etc. , []  - 0 Emergency Hospital Admission (emergent condition) X- 1 10 Simple Discharge Coordination []  - 0 Complex (extensive) Discharge Coordination PROCESS - Special Needs []  - 0 Pediatric / Minor Patient Management []  -  0 Isolation Patient Management []  - 0 Hearing / Language / Visual special needs []  - 0 Assessment of Community assistance (transportation, D/C planning, etc.) []  - 0 Additional assistance / Altered  mentation []  - 0 Support Surface(s) Assessment (bed, cushion, seat, etc.) INTERVENTIONS - Wound Cleansing / Measurement []  - 0 Simple Wound Cleansing - one wound X- 2 5 Complex Wound Cleansing - multiple wounds X- 1 5 Wound Imaging (photographs - any number of wounds) []  - 0 Wound Tracing (instead of photographs) []  - 0 Simple Wound Measurement - one wound X- 2 5 Complex Wound Measurement - multiple wounds INTERVENTIONS - Wound Dressings []  - 0 Small Wound Dressing one or multiple wounds []  - 0 Medium Wound Dressing one or multiple wounds X- 2 20 Large Wound Dressing one or multiple wounds []  - 0 Application of Medications - topical []  - 0 Application of Medications - injection INTERVENTIONS - Miscellaneous []  - 0 External ear exam []  - 0 Specimen Collection (cultures, biopsies, blood, body fluids, etc.) []  - 0 Specimen(s) / Culture(s) sent or taken to Lab for analysis []  - 0 Patient Transfer (multiple staff / Civil Service fast streamer / Similar devices) []  - 0 Simple Staple / Suture removal (25 or less) []  - 0 Complex Staple / Suture removal (26 or more) []  - 0 Hypo / Hyperglycemic Management (close monitor of Blood Glucose) []  - 0 Ankle / Brachial Index (ABI) - do not check if billed separately X- 1 5 Vital Signs Has the patient been seen at the hospital within the last three years: Yes Total Score: 145 Level Of Care: New/Established - Level 4 Electronic Signature(s) Signed: 07/11/2020 5:46:25 PM By: Levan Hurst RN, BSN Entered By: Levan Hurst on 07/11/2020 13:55:54 -------------------------------------------------------------------------------- Encounter Discharge Information Details Patient Name: Date of Service: [name], Daesha A. 07/11/2020 1:30 PM Medical Record  Number: [id number] Patient Account Number: [id number] Date of Birth/Sex: Treating RN: 12-21-1925 (87 y.o. Nita Sells Primary Care Maricus Tanzi: [name] Other Clinician: Referring Wael Maestas: Treating Finnlee Guarnieri/Extender: [name] in Treatment: 4 Encounter Discharge Information Items Discharge Condition: Stable Ambulatory Status: Walker Discharge Destination: Other (Note Required) Telephoned: No Orders Sent: Yes Transportation: Private Auto Accompanied By: grand daughter Schedule Follow-up Appointment: No Clinical Summary of Care: Provided Form Type Recipient Paper Patient patient Electronic Signature(s) Signed: 07/12/2020 8:38:46 AM By: Leane Call Entered By: Leane Call on 07/11/2020 15:59:17 -------------------------------------------------------------------------------- Lower Extremity Assessment Details Patient Name: Date of Service: [name], [place]. 07/11/2020 1:30 PM Medical Record Number: [id number] Patient Account Number: [id number] Date of Birth/Sex: Treating RN: 1925-07-22 (87 y.o. Nita Sells Primary Care Shaya Reddick: [name] Other Clinician: Referring Chaka Jefferys: Treating Ayaan Ringle/Extender: [name] in Treatment: 4 Edema Assessment Assessed: [Left: No] [Right: No] Edema: [Left: Yes] [Right: Yes] Calf Left: Right: Point of Measurement: 30 cm From Medial Instep 34.5 cm 38 cm Ankle Left: Right: Point of Measurement: 9 cm From Medial Instep 22.6 cm 24.4 cm Vascular Assessment Pulses: Dorsalis Pedis Palpable: [Left:Yes] [Right:Yes] Electronic Signature(s) Signed: 07/11/2020 1:32:27 PM By: Maye Hides Signed: 07/12/2020 8:38:46 AM By: Leane Call Entered By: Leane Call on 07/11/2020 13:32:27 -------------------------------------------------------------------------------- Multi Wound Chart Details Patient Name: Date of Service: [name],  [place]. 07/11/2020 1:30 PM Medical Record Number: [id number] Patient Account Number: [id number] Date of Birth/Sex: Treating RN: [birth year] (87 y.o. [name] Primary Care Cornell Gaber: [name] Other Clinician: Referring Akaysha Cobern: Treating Loribeth Katich/Extender: [name] in Treatment: 4 Vital Signs Height(in): 65 Pulse(bpm): 75 Weight(lbs): 163 Blood Pressure(mmHg): 115/72 Body Mass Index(BMI): 27 Temperature(F): 98.0 Respiratory Rate(breaths/min): 20 Photos: [6:No Photos Right, Anterior Lower Leg] [7:No Photos Left, Anterior Lower  Leg] [N/A:N/A N/A] Wound Location: [6:Blister] [7:Blister] [N/A:N/A] Wounding Event: [6:Malignant Wound] [7:Malignant Wound] [N/A:N/A] Primary Etiology: [6:Anemia, Arrhythmia, Congestive Heart Anemia, Arrhythmia, Congestive Heart N/A] Comorbid History: [6:Failure, Hypertension, Colitis, Osteoarthritis 04/20/2020] [7:Failure, Hypertension, Colitis, Osteoarthritis 04/20/2020] [N/A:N/A] Date Acquired: [6:4] [7:4] [N/A:N/A] Weeks of Treatment: [6:Open] [7:Open] [N/A:N/A] Wound Status: [6:4.2x2.5x0.2] [7:3x1.8x0.2] [N/A:N/A] Measurements L x W x D (cm) [6:8.247] [7:4.241] [N/A:N/A] A (cm) : rea [6:1.649] [7:0.848] [N/A:N/A] Volume (cm) : [6:-30.40%] [7:-38.10%] [N/A:N/A] % Reduction in Area: [6:13.10%] [7:-176.20%] [N/A:N/A] % Reduction in Volume: [6:Full Thickness Without Exposed] [7:Full Thickness Without Exposed] [N/A:N/A] Classification: [6:Support Structures Medium] [7:Support Structures Medium] [N/A:N/A] Exudate Amount: [6:Serosanguineous] [7:Serosanguineous] [N/A:N/A] Exudate Type: [6:red, brown] [7:red, brown] [N/A:N/A] Exudate Color: [6:Distinct, outline attached] [7:Distinct, outline attached] [N/A:N/A] Wound Margin: [6:Small (1-33%)] [7:Small (1-33%)] [N/A:N/A] Granulation Amount: [6:Red, Pink] [7:Red] [N/A:N/A] Granulation Quality: [6:Large (67-100%)] [7:Large (67-100%)] [N/A:N/A] Necrotic  Amount: [6:Fat Layer (Subcutaneous Tissue): Yes Fat Layer (Subcutaneous Tissue): Yes N/A] Exposed Structures: [6:Fascia: No Tendon: No Muscle: No Joint: No Bone: No None] [7:Fascia: No Tendon: No Muscle: No Joint: No Bone: No None] [N/A:N/A] Treatment Notes Electronic Signature(s) Signed: 07/11/2020 2:34:12 PM By: Kalman Shan DO Signed: 07/11/2020 5:46:25 PM By: Levan Hurst RN, BSN Entered By: Kalman Shan on 07/11/2020 14:26:00 -------------------------------------------------------------------------------- Multi-Disciplinary Care Plan Details Patient Name: Date of Service: [name], Tamiah A. 07/11/2020 1:30 PM Medical Record Number: [id number] Patient Account Number: [id number] Date of Birth/Sex: Treating RN: 07-24-1925 (87 y.o. [name] Primary Care Alante Weimann: [name] Other Clinician: Referring Rochelle Larue: Treating Ariela Mochizuki/Extender: [name] in Treatment: [place] reviewed with physician Active Inactive Wound/Skin Impairment Nursing Diagnoses: Impaired tissue integrity Knowledge deficit related to ulceration/compromised skin integrity Goals: Patient/caregiver will verbalize understanding of skin care regimen Date Initiated: 06/11/2020 Target Resolution Date: 08/10/2020 Goal Status: Active Interventions: Assess patient/caregiver ability to obtain necessary supplies Assess patient/caregiver ability to perform ulcer/skin care regimen upon admission and as needed Assess ulceration(s) every visit Provide education on ulcer and skin care Notes: Electronic Signature(s) Signed: 07/11/2020 5:46:25 PM By: Levan Hurst RN, BSN Entered By: Levan Hurst on 07/11/2020 13:54:09 -------------------------------------------------------------------------------- Pain Assessment Details Patient Name: Date of Service: [name], Micheal A. 07/11/2020 1:30 PM Medical Record Number: [id number] Patient Account  Number: [id number] Date of Birth/Sex: Treating RN: [birth year] (87 y.o. Nita Sells Primary Care Lorice Lafave: [name] Other Clinician: Referring Jesiah Yerby: Treating Alyssabeth Bruster/Extender: [name] in Treatment: 4 Active Problems Location of Pain Severity and Description of Pain Patient Has Paino No Site Locations Rate the pain. Rate the pain. Current Pain Level: 0 Pain Management and Medication Current Pain Management: Medication: No Cold Application: No Rest: No Massage: No Activity: No T.E.N.S.: No Heat Application: No Leg drop or elevation: No Is the Current Pain Management Adequate: Inadequate How does your wound impact your activities of daily livingo Sleep: No Bathing: No Appetite: No Relationship With Others: No Bladder Continence: No Emotions: No Bowel Continence: No Work: No Toileting: No Drive: No Dressing: No Hobbies: No Electronic Signature(s) Signed: 07/11/2020 1:19:11 PM By: Maye Hides Signed: 07/12/2020 8:38:46 AM By: Leane Call Entered By: Leane Call on 07/11/2020 13:19:11 -------------------------------------------------------------------------------- Patient/Caregiver Education Details Patient Name: Date of Service: [name], Lerlene Loni Muse 6/22/2022andnbsp1:30 PM Medical Record Number: [id number] Patient Account Number: [id number] Date of Birth/Gender: Treating RN: 01-Dec-1925 (87 y.o. [name] Primary Care Physician: [name] Other Clinician: Referring Physician: Treating Physician/Extender: [name] in Treatment: 4 Education Assessment Education  Provided To: Patient Education Topics Provided Wound/Skin Impairment: Methods: Explain/Verbal Responses: State content correctly Electronic Signature(s) Signed: 07/11/2020 5:46:25 PM By: Levan Hurst RN, BSN Entered By: Levan Hurst on 07/11/2020  13:54:22 -------------------------------------------------------------------------------- Wound Assessment Details Patient Name: Date of Service: [name], [place]. 07/11/2020 1:30 PM Medical Record Number: [id number] Patient Account Number: [id number] Date of Birth/Sex: Treating RN: September 29, 1925 (87 y.o. Nita Sells Primary Care Asa Baudoin: [name] Other Clinician: Referring Kenslie Abbruzzese: Treating Mekai Wilkinson/Extender: [name] in Treatment: 4 Wound Status Wound Number: 6 Primary Malignant Wound Etiology: Wound Location: Right, Anterior Lower Leg Wound Open Wounding Event: Blister Status: Date Acquired: 04/20/2020 Comorbid Anemia, Arrhythmia, Congestive Heart Failure, Hypertension, Weeks Of Treatment: 4 History: Colitis, Osteoarthritis Clustered Wound: No Wound Measurements Length: (cm) 4.2 Width: (cm) 2.5 Depth: (cm) 0.2 Area: (cm) 8.247 Volume: (cm) 1.649 % Reduction in Area: -30.4% % Reduction in Volume: 13.1% Epithelialization: None Tunneling: No Undermining: No Wound Description Classification: Full Thickness Without Exposed Support Structures Wound Margin: Distinct, outline attached Exudate Amount: Medium Exudate Type: Serosanguineous Exudate Color: red, brown Foul Odor After Cleansing: No Slough/Fibrino Yes Wound Bed Granulation Amount: Small (1-33%) Exposed Structure Granulation Quality: Red, Pink Fascia Exposed: No Necrotic Amount: Large (67-100%) Fat Layer (Subcutaneous Tissue) Exposed: Yes Necrotic Quality: Adherent Slough Tendon Exposed: No Muscle Exposed: No Joint Exposed: No Bone Exposed: No Treatment Notes Wound #6 (Lower Leg) Wound Laterality: Right, Anterior Cleanser Soap and Water Discharge Instruction: May shower and wash wound with dial antibacterial soap and water prior to dressing change. Peri-Wound Care Triamcinolone 15 (g) Discharge Instruction: Use triamcinolone 15 (g) in clinic  only. Sween Lotion (Moisturizing lotion) Discharge Instruction: Apply moisturizing lotion as directed Topical Primary Dressing KerraCel Ag Gelling Fiber Dressing, 2x2 in (silver alginate) Discharge Instruction: Apply silver alginate to wound bed as instructed Secondary Dressing Woven Gauze Sponge, Non-Sterile 4x4 in Discharge Instruction: Apply over primary dressing as directed. ABD Pad, 8x10 Discharge Instruction: Apply over primary dressing as directed. Secured With Compression Wrap Kerlix Roll 4.5x3.1 (in/yd) Discharge Instruction: Apply Kerlix and Coban compression as directed. Coban Self-Adherent Wrap 4x5 (in/yd) Discharge Instruction: Apply over Kerlix as directed. Compression Stockings Add-Ons Electronic Signature(s) Signed: 07/11/2020 1:37:13 PM By: Maye Hides Signed: 07/12/2020 8:38:46 AM By: Leane Call Entered By: Leane Call on 07/11/2020 13:37:13 -------------------------------------------------------------------------------- Wound Assessment Details Patient Name: Date of Service: [name], [place]. 07/11/2020 1:30 PM Medical Record Number: [id number] Patient Account Number: [id number] Date of Birth/Sex: Treating RN: 05-25-1925 (87 y.o. Nita Sells Primary Care Milinda Sweeney: [name] Other Clinician: Referring Sabena Winner: Treating Kynlei Piontek/Extender: [name] in Treatment: 4 Wound Status Wound Number: 7 Primary Malignant Wound Etiology: Wound Location: Left, Anterior Lower Leg Wound Open Wounding Event: Blister Status: Date Acquired: 04/20/2020 Comorbid Anemia, Arrhythmia, Congestive Heart Failure, Hypertension, Weeks Of Treatment: 4 History: Colitis, Osteoarthritis Clustered Wound: No Wound Measurements Length: (cm) 3 Width: (cm) 1.8 Depth: (cm) 0.2 Area: (cm) 4.241 Volume: (cm) 0.848 % Reduction in Area: -38.1% % Reduction in Volume: -176.2% Epithelialization: None Tunneling:  No Undermining: No Wound Description Classification: Full Thickness Without Exposed Support Structures Wound Margin: Distinct, outline attached Exudate Amount: Medium Exudate Type: Serosanguineous Exudate Color: red, brown Foul Odor After Cleansing: No Slough/Fibrino Yes Wound Bed Granulation Amount: Small (1-33%) Exposed Structure Granulation Quality: Red Fascia Exposed: No Necrotic Amount: Large (67-100%) Fat Layer (Subcutaneous Tissue) Exposed: Yes Necrotic Quality: Adherent Slough Tendon Exposed: No Muscle Exposed: No Joint Exposed: No Bone Exposed: No Treatment Notes  Wound #7 (Lower Leg) Wound Laterality: Left, Anterior Cleanser Soap and Water Discharge Instruction: May shower and wash wound with dial antibacterial soap and water prior to dressing change. Peri-Wound Care Triamcinolone 15 (g) Discharge Instruction: Use triamcinolone 15 (g) in clinic only. Sween Lotion (Moisturizing lotion) Discharge Instruction: Apply moisturizing lotion as directed Topical Primary Dressing KerraCel Ag Gelling Fiber Dressing, 2x2 in (silver alginate) Discharge Instruction: Apply silver alginate to wound bed as instructed Secondary Dressing Woven Gauze Sponge, Non-Sterile 4x4 in Discharge Instruction: Apply over primary dressing as directed. ABD Pad, 8x10 Discharge Instruction: Apply over primary dressing as directed. Secured With Compression Wrap Kerlix Roll 4.5x3.1 (in/yd) Discharge Instruction: Apply Kerlix and Coban compression as directed. Coban Self-Adherent Wrap 4x5 (in/yd) Discharge Instruction: Apply over Kerlix as directed. Compression Stockings Add-Ons Electronic Signature(s) Signed: 07/11/2020 1:37:55 PM By: Maye Hides Signed: 07/12/2020 8:38:46 AM By: Leane Call Entered By: Leane Call on 07/11/2020 13:37:55 -------------------------------------------------------------------------------- Vitals Details Patient Name: Date of Service: [name], [place]  A. 07/11/2020 1:30 PM Medical Record Number: [id number] Patient Account Number: [id number] Date of Birth/Sex: Treating RN: 1925/06/04 (87 y.o. Nita Sells Primary Care Ishitha Roper: [name] Other Clinician: Referring Esiquio Boesen: Treating Enriqueta Augusta/Extender: [name] in Treatment: 4 Vital Signs Time Taken: 13:19 Temperature (F): 98.0 Height (in): 65 Pulse (bpm): 85 Weight (lbs): 163 Respiratory Rate (breaths/min): 20 Body Mass Index (BMI): 27.1 Blood Pressure (mmHg): 115/72 Reference Range: 80 - 120 mg / dl Airway Pulse Oximetry (%): 95 Electronic Signature(s) Signed: 07/11/2020 1:18:49 PM By: Maye Hides Signed: 07/12/2020 8:38:46 AM By: Leane Call Entered By: Leane Call on 07/11/2020 13:18:49

## 2020-07-13 ENCOUNTER — Ambulatory Visit: Payer: Medicare Other | Admitting: Radiation Oncology

## 2020-07-16 ENCOUNTER — Telehealth: Payer: Self-pay | Admitting: *Deleted

## 2020-07-16 NOTE — Telephone Encounter (Signed)
This RN received VM from the pt's grand daughter stating episode of nausea occurring over the weekend.  Inquiring if above related to the IV treatment 2 weeks ago " I wouldn't think you would get nauseated this far out from the infusion"  This RN discussed issues of nausea related to treatment and reason prescription for the anti nausea medication compazine.  Pt did not fill the prescription due to pharmacist at [facility] where she resided informed her the medication is an " antipsychotic " medication.  This RN discussed use of compazine for nausea is very well established and MD would like for her to have on hand if needed.  Discussed concern with nausea interfering with intake - especially hydration in an elderly patient.  Lattie Haw verbalized understanding.  Of note this RN also attempted to call pt to discuss- and obtained identified VM- message left.

## 2020-07-18 DIAGNOSIS — C44722 Squamous cell carcinoma of skin of right lower limb, including hip: Secondary | ICD-10-CM | POA: Diagnosis not present

## 2020-07-18 DIAGNOSIS — C44729 Squamous cell carcinoma of skin of left lower limb, including hip: Secondary | ICD-10-CM | POA: Diagnosis not present

## 2020-07-18 DIAGNOSIS — L97822 Non-pressure chronic ulcer of other part of left lower leg with fat layer exposed: Secondary | ICD-10-CM | POA: Diagnosis not present

## 2020-07-18 DIAGNOSIS — I872 Venous insufficiency (chronic) (peripheral): Secondary | ICD-10-CM | POA: Diagnosis not present

## 2020-07-18 DIAGNOSIS — L97812 Non-pressure chronic ulcer of other part of right lower leg with fat layer exposed: Secondary | ICD-10-CM | POA: Diagnosis not present

## 2020-07-18 DIAGNOSIS — Z4800 Encounter for change or removal of nonsurgical wound dressing: Secondary | ICD-10-CM | POA: Diagnosis not present

## 2020-07-21 DIAGNOSIS — Z7901 Long term (current) use of anticoagulants: Secondary | ICD-10-CM | POA: Diagnosis not present

## 2020-07-21 DIAGNOSIS — I872 Venous insufficiency (chronic) (peripheral): Secondary | ICD-10-CM | POA: Diagnosis not present

## 2020-07-21 DIAGNOSIS — K5730 Diverticulosis of large intestine without perforation or abscess without bleeding: Secondary | ICD-10-CM | POA: Diagnosis not present

## 2020-07-21 DIAGNOSIS — I4892 Unspecified atrial flutter: Secondary | ICD-10-CM | POA: Diagnosis not present

## 2020-07-21 DIAGNOSIS — F419 Anxiety disorder, unspecified: Secondary | ICD-10-CM | POA: Diagnosis not present

## 2020-07-21 DIAGNOSIS — I4819 Other persistent atrial fibrillation: Secondary | ICD-10-CM | POA: Diagnosis not present

## 2020-07-21 DIAGNOSIS — L97812 Non-pressure chronic ulcer of other part of right lower leg with fat layer exposed: Secondary | ICD-10-CM | POA: Diagnosis not present

## 2020-07-21 DIAGNOSIS — M19042 Primary osteoarthritis, left hand: Secondary | ICD-10-CM | POA: Diagnosis not present

## 2020-07-21 DIAGNOSIS — I4510 Unspecified right bundle-branch block: Secondary | ICD-10-CM | POA: Diagnosis not present

## 2020-07-21 DIAGNOSIS — Z792 Long term (current) use of antibiotics: Secondary | ICD-10-CM | POA: Diagnosis not present

## 2020-07-21 DIAGNOSIS — Z4800 Encounter for change or removal of nonsurgical wound dressing: Secondary | ICD-10-CM | POA: Diagnosis not present

## 2020-07-21 DIAGNOSIS — M8580 Other specified disorders of bone density and structure, unspecified site: Secondary | ICD-10-CM | POA: Diagnosis not present

## 2020-07-21 DIAGNOSIS — M19041 Primary osteoarthritis, right hand: Secondary | ICD-10-CM | POA: Diagnosis not present

## 2020-07-21 DIAGNOSIS — Z981 Arthrodesis status: Secondary | ICD-10-CM | POA: Diagnosis not present

## 2020-07-21 DIAGNOSIS — K219 Gastro-esophageal reflux disease without esophagitis: Secondary | ICD-10-CM | POA: Diagnosis not present

## 2020-07-21 DIAGNOSIS — C44722 Squamous cell carcinoma of skin of right lower limb, including hip: Secondary | ICD-10-CM | POA: Diagnosis not present

## 2020-07-21 DIAGNOSIS — C44729 Squamous cell carcinoma of skin of left lower limb, including hip: Secondary | ICD-10-CM | POA: Diagnosis not present

## 2020-07-21 DIAGNOSIS — Z9089 Acquired absence of other organs: Secondary | ICD-10-CM | POA: Diagnosis not present

## 2020-07-21 DIAGNOSIS — I110 Hypertensive heart disease with heart failure: Secondary | ICD-10-CM | POA: Diagnosis not present

## 2020-07-21 DIAGNOSIS — I5032 Chronic diastolic (congestive) heart failure: Secondary | ICD-10-CM | POA: Diagnosis not present

## 2020-07-21 DIAGNOSIS — Z853 Personal history of malignant neoplasm of breast: Secondary | ICD-10-CM | POA: Diagnosis not present

## 2020-07-21 DIAGNOSIS — E039 Hypothyroidism, unspecified: Secondary | ICD-10-CM | POA: Diagnosis not present

## 2020-07-21 DIAGNOSIS — E785 Hyperlipidemia, unspecified: Secondary | ICD-10-CM | POA: Diagnosis not present

## 2020-07-21 DIAGNOSIS — L97822 Non-pressure chronic ulcer of other part of left lower leg with fat layer exposed: Secondary | ICD-10-CM | POA: Diagnosis not present

## 2020-07-21 DIAGNOSIS — M47816 Spondylosis without myelopathy or radiculopathy, lumbar region: Secondary | ICD-10-CM | POA: Diagnosis not present

## 2020-07-25 DIAGNOSIS — Z4800 Encounter for change or removal of nonsurgical wound dressing: Secondary | ICD-10-CM | POA: Diagnosis not present

## 2020-07-25 DIAGNOSIS — L97812 Non-pressure chronic ulcer of other part of right lower leg with fat layer exposed: Secondary | ICD-10-CM | POA: Diagnosis not present

## 2020-07-25 DIAGNOSIS — L97822 Non-pressure chronic ulcer of other part of left lower leg with fat layer exposed: Secondary | ICD-10-CM | POA: Diagnosis not present

## 2020-07-25 DIAGNOSIS — C44722 Squamous cell carcinoma of skin of right lower limb, including hip: Secondary | ICD-10-CM | POA: Diagnosis not present

## 2020-07-25 DIAGNOSIS — C44729 Squamous cell carcinoma of skin of left lower limb, including hip: Secondary | ICD-10-CM | POA: Diagnosis not present

## 2020-07-25 DIAGNOSIS — I872 Venous insufficiency (chronic) (peripheral): Secondary | ICD-10-CM | POA: Diagnosis not present

## 2020-07-25 NOTE — Progress Notes (Signed)
[place]  Telephone:(336) [phone] Fax:(336) [phone]     ID: [name] DOB: 21-Feb-1981  MR#: [id number]  MMN#:[id number]  Patient Care Team: [name], MD as PCP - General Angelena Form Annita Brod, MD as PCP - Cardiology (Cardiology) Rolm Bookbinder, MD as Consulting Physician (General Surgery) Islam Villescas, Virgie Dad, MD as Consulting Physician (Oncology) Burnell Blanks, MD as Consulting Physician (Cardiology) Jarome Matin, MD as Consulting Physician (Dermatology) Marlou Sa Tonna Corner, MD as Consulting Physician (Orthopedic Surgery) Cameron Sprang, MD as Consulting Physician (Neurology) Eppie Gibson, MD as Attending Physician (Radiation Oncology) OTHER MD:   CHIEF COMPLAINT: Estrogen receptor positive invasive breast cancer  CURRENT TREATMENT: cemiplimab/ Libtayo   INTERVAL HISTORY: [name] returns today for follow-up of her new skin cancer.  She is accompanied by her granddaughter [name] began systemic therapy with cemiplimab on 07/04/2020. She had significant diarrhea from the treatment and we started her on Imodium and Questran.  She was not able to obtain the Questran for insurance reasons so that was changed to colestipol.  She has actually not started the colestipol but is using the Imodium successfully: Yesterday for example she only had 1 bowel movement and it was normal.  The last mammogram we have records for is from July 2019 at [facility] showing a density category A.  Kynli tells me there has been too much on her plate and she has neglected her health to some extent.   REVIEW OF SYSTEMS: Randall gets [employer] to transport her every day to the senior care center where her husband is and she is there from 1 PM to 4 PM with him.  Aside from that she does her own activities of daily living but "not much".  She had a hot dog for July 4 and enjoyed that.  A detailed review of systems today was otherwise stable   COVID 19 VACCINATION STATUS:  fully vaccinated [employer]), with booster 85/2021   BREAST CANCER HISTORY: From the original intake note:  [name] noted a change in her left breast and had bilateral screening mammography [facility] 06/07/2015, with tomosynthesis. Breast density was category A. There was a new oval mass in the left breast central to the nipple. Left breast ultrasound the same day confirmed a 3.4 cm oval mass in the left breast upper outer quadrant, correlating with the mammography. The left axilla was sonographically benign.  Biopsy of the left breast mass in question 06/11/2015 showed ([id number]) a papillary lesion, consistent with an intraductal papilloma, but due to fragmentation the edges of the lesion could not be assessed.  Accordingly the patient was referred to surgery, and after appropriate discussion underwent left lumpectomy 07/25/2015. The final pathology ([id number]) showed 2 foci of invasive ductal carcinoma, grade 2, measuring 3.0 cm, and grade 1, measuring 0.9 cm. There was also ductal carcinoma in situ. The invasive tumor was present at the superior margin, and ductal carcinoma in situ was focally less than 0.1 cm to the same margin. There was evidence of lymphovascular invasion. The prognostic panel showed estrogen receptor 100% positive, progesterone receptor 100% positive, both with strong staining intensity, for both lesions. HER-2 was also negative for both lesions, the signals ratio being 1.45-1.65, and the number per cell 2.10-2.80.  The patient's subsequent history is as detailed below   PAST MEDICAL HISTORY: Past Medical History:  Diagnosis Date   Anxiety    denies   Atrial flutter ([place])    Bilateral edema of lower extremity  Breast cancer ([place])    left breast   Chronic constipation    Chronic diastolic CHF (congestive heart failure) (HCC)    Diverticulosis of colon    GERD (gastroesophageal reflux disease)    H/O hiatal hernia    History of cellulitis    LEFT LOWER LEG --  SEPT  2015   History of colitis    DX  2003  WITH ISCHEMIC COLITIS   History of diverticulitis of colon    perferated diverticulitis w/ abscess 08-25-2013 drain placed --  resolved without surgical intervention   History of GI bleed    2011--- UPPER SECONARY GASTRIC ULCER FROM NSAID USE   History of Helicobacter pylori infection    2011   History of ulcer of lower limb    2012   RIGHT LOWER EXTREMITIY--  STATSIS ULCER   Hyperlipidemia    Hypertension    Hypothyroidism    Iron deficiency anemia 2011   elevated MCV   LBP (low back pain)    severe lumbar spondylosis s/p L3/L4,L4/L5 fusion 12/10   Lumbar spondylolysis    OA (osteoarthritis)    "hands" (08/25/2013)   Open wound of left lower leg    Osteopenia    Persistent atrial fibrillation ([place])    CARDIOLOGIST-   DR MCALANY   RBBB    Urinary frequency     PAST SURGICAL HISTORY: Past Surgical History:  Procedure Laterality Date   APPLICATION OF A-CELL OF EXTREMITY Left 06/21/2013   Procedure: APPLICATION OF A-CELL OF EXTREMITY;  Surgeon: Theodoro Kos, DO;  Location: [place];  Service: Plastics;  Laterality: Left;   APPLICATION OF A-CELL OF EXTREMITY Left 08/22/2013   Procedure: APPLICATION OF A-CELL/VAC TO LOWER LEFT LEG WOUND;  Surgeon: Theodoro Kos, DO;  Location: [place];  Service: Plastics;  Laterality: Left;   APPLICATION OF A-CELL OF EXTREMITY Left 02/06/2014   Procedure: WITH PLACEMENT OF A -CELL ;  Surgeon: Theodoro Kos, DO;  Location: [place];  Service: Plastics;  Laterality: Left;   APPLICATION OF A-CELL OF EXTREMITY Left 08/09/2014   Procedure: PLACEMENT OF A CELL;  Surgeon: Theodoro Kos, DO;  Location: [place];  Service: Plastics;  Laterality: Left;   APPLICATION OF WOUND VAC Left 06/16/2013   Procedure: APPLICATION OF WOUND VAC;  Surgeon: Meredith Pel, MD;  Location: [place];  Service: Orthopedics;  Laterality: Left;   APPLICATION OF WOUND VAC Left 06/21/2013   Procedure: APPLICATION OF  WOUND VAC;  Surgeon: Theodoro Kos, DO;  Location: [place];  Service: Plastics;  Laterality: Left;   BIOPSY BREAST  07/25/2015   LEFT RADIOACTIVE SEED GUIDED EXCISIONAL BREAST BIOPSY (Left) as a surgical intervention   EXTERNAL FIXATION LEG Left 06/16/2013   Procedure: EXTERNAL FIXATION LEG;  Surgeon: Meredith Pel, MD;  Location: [place];  Service: Orthopedics;  Laterality: Left;   EXTERNAL FIXATION REMOVAL Left 06/21/2013   Procedure: REMOVAL EXTERNAL FIXATION LEG;  Surgeon: Meredith Pel, MD;  Location: [place];  Service: Orthopedics;  Laterality: Left;   EYE SURGERY Bilateral    cataract removal   HARDWARE REMOVAL Left 05/23/2014   Procedure: HARDWARE REMOVAL;  Surgeon: Meredith Pel, MD;  Location: [place];  Service: Orthopedics;  Laterality: Left;  REMOVAL OF HARDWARE LEFT TIBIA   I & D EXTREMITY Left 06/16/2013   Procedure: IRRIGATION AND DEBRIDEMENT EXTREMITY;  Surgeon: Meredith Pel, MD;  Location: [place];  Service: Orthopedics;  Laterality: Left;   I & D  EXTREMITY Left 02/06/2014   Procedure: IRRIGATION AND DEBRIDEMENT LEFT LEG WOUND ;  Surgeon: Theodoro Kos, DO;  Location: [place];  Service: Plastics;  Laterality: Left;   I & D EXTREMITY Left 08/09/2014   Procedure: IRRIGATION AND DEBRIDEMENT  OF LEFT LOWER LEG;  Surgeon: Theodoro Kos, DO;  Location: [place];  Service: Plastics;  Laterality: Left;   LUMBAR FUSION  01-02-2009   L3-L5   RADIOACTIVE SEED GUIDED EXCISIONAL BREAST BIOPSY Left 07/25/2015   Procedure: LEFT RADIOACTIVE SEED GUIDED EXCISIONAL BREAST BIOPSY;  Surgeon: Rolm Bookbinder, MD;  Location: [place];  Service: General;  Laterality: Left;   RE-EXCISION OF BREAST LUMPECTOMY Left 09/05/2015   Procedure: RE-EXCISION OF LEFT BREAST LUMPECTOMY;  Surgeon: Rolm Bookbinder, MD;  Location: [place];  Service: General;  Laterality: Left;   SHOULDER OPEN ROTATOR CUFF REPAIR Left 1997   TIBIA IM NAIL INSERTION Left 06/21/2013   Procedure: INTRAMEDULLARY (IM) NAIL  TIBIAL;  Surgeon: Meredith Pel, MD;  Location: [place];  Service: Orthopedics;  Laterality: Left;   TONSILLECTOMY  AS CHILD   TOTAL HIP ARTHROPLASTY Right 06-07-2010   TRANSTHORACIC ECHOCARDIOGRAM  09-11-2010   DR MCALHANY   LVSF  55-60%/  MILD MV CALCIFICATION WITH NO STENOSIS/  MILD MR & TR / MODERATE LAE/  MODERATE TO SEVERE RAE   UPPER GI ENDOSCOPY  03-29-2010    FAMILY HISTORY Family History  Problem Relation Age of Onset   Cancer Father    Hypertension Other   The patient's father died at the age of 58 from heart problems. The patient's mother died at the age of 55 with a ruptured aneurysm. The patient had no brothers, 1 sister. There is no history of breast or ovarian cancer in the family.    GYNECOLOGIC HISTORY:  No LMP recorded. Patient is postmenopausal. Menarche age 46, first live birth age 13. The patient is GX P2. She stopped having periods in her early 44s. She did not take hormone replacement. She used oral contraceptives remotely for approximately 2 years, with no complications.   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 Her husband Ernie Hew was a Airline pilot.  According to the patient there are significant cognitive limitations on his side.  They currently live at [facility]. Daughter Jerene Dilling lives in [place], near [place]. With her husband Jenny Reichmann. Daughter Pam lives in [place]. The patient has 2 grandchildren and 4 great-grandchildren. She is a Psychologist, forensic.   ADVANCED DIRECTIVES: The patient has a living will in place and she has named her granddaughter Almetta Lovely as her healthcare part of attorney. Lattie Haw can be reached at [place]: Social History   Tobacco Use   Smoking status: Never   Smokeless tobacco: Never  Vaping Use   Vaping Use: Never used  Substance Use Topics   Alcohol use: No    Comment: 1 glass occ wine 08/03/14- none in a year   Drug use: No     Colonoscopy: Laurence Spates,  remote  PAP:  Bone density:   Allergies  Allergen Reactions   Zosyn [Piperacillin Sod-Tazobactam So] Hives, Itching, Rash and Other (See Comments)    Morbilliform eruptions with itching- looks like Measles   Atorvastatin     myalgias   Atenolol Nausea And Vomiting   Clarithromycin Nausea And Vomiting   Codeine Sulfate Nausea Only   Levaquin [Levofloxacin] Nausea Only   Macrodantin Other (See Comments)    UNSPECIFIED  Oxycodone-Acetaminophen Nausea And Vomiting    Current Outpatient Medications  Medication Sig Dispense Refill   acetaminophen (TYLENOL) 500 MG tablet Take 500 mg by mouth daily as needed for mild pain.      apixaban (ELIQUIS) 5 MG TABS tablet Take 1 tablet (5 mg total) by mouth 2 (two) times daily. 180 tablet 3   B Complex-C (B-COMPLEX WITH VITAMIN C) tablet Take 1 tablet by mouth daily.     busPIRone (BUSPAR) 15 MG tablet Take 1 tablet (15 mg total) by mouth 2 (two) times daily. 180 tablet 11   Cholecalciferol (VITAMIN D3) 1000 UNITS tablet Take 1,000 Units by mouth daily.     colestipol (COLESTID) 1 g tablet Take 1 tablet (1 g total) by mouth in the morning. 60 tablet 12   diltiazem (CARDIZEM CD) 120 MG 24 hr capsule TAKE 1 CAPSULE (120 MG TOTAL) BY MOUTH DAILY. 90 capsule 3   doxycycline (VIBRA-TABS) 100 MG tablet Take 1 tablet (100 mg total) by mouth daily. 90 tablet 3   famotidine (PEPCID) 20 MG tablet Take 20 mg by mouth daily.     gabapentin (NEURONTIN) 100 MG capsule Take 1 capsule (100 mg total) by mouth at bedtime. Pain 90 capsule 3   levothyroxine (SYNTHROID) 25 MCG tablet TAKE 1 TABLET BY MOUTH DAILY FOR THYROID. 30 tablet 11   loperamide (IMODIUM) 2 MG capsule Take 1 capsule (2 mg total) by mouth as needed for diarrhea or loose stools. Take 1 tab with first loose stool then one tab after each loose stool for maximum dose of 8 tabs daily. Pt to call the office if need for greater then 8 tabs a day 60 capsule 0   metoprolol succinate (TOPROL-XL) 50 MG 24 hr  tablet Take 0.5 tablets (25 mg total) by mouth 2 (two) times daily. Take with or immediately following a meal. 180 tablet 3   Multiple Vitamins-Minerals (MULTIVITAMIN WITH MINERALS) tablet Take 1 tablet by mouth daily.     polyethylene glycol (MIRALAX) 17 g packet Take 17 g by mouth daily. 30 each 0   prochlorperazine (COMPAZINE) 5 MG tablet Take 1 tablet (5 mg total) by mouth every 8 (eight) hours as needed for nausea or vomiting. 20 tablet 1   temazepam (RESTORIL) 15 MG capsule TAKE 1 CAPSULE BY MOUTH EVERY NIGHT AT BEDTIME AS NEEDED FOR SLEEP 60 capsule 3   No current facility-administered medications for this visit.    OBJECTIVE: White woman who appears stated age  20:   07/26/20 1203  BP: 129/68  Pulse: 70  Resp: 18  Temp: 97.6 F (36.4 C)  SpO2: 98%      Body mass index is 25.69 kg/m.     ECOG FS:1 - Symptomatic but completely ambulatory  Sclerae unicteric, EOMs intact Lungs no rales or rhonchi Heart regular rate and rhythm Abd soft, nontender, positive bowel sounds MSK kyphosis but no focal spinal tenderness Neuro: nonfocal, well oriented, appropriate affect Breasts: Deferred   06/20/2020 [place] appointment    LAB RESULTS:  CMP     Component Value Date/Time   NA 139 07/04/2020 1309   NA 134 08/11/2018 1425   NA 137 07/28/2016 1334   K 3.6 07/04/2020 1309   K 3.8 07/28/2016 1334   CL 102 07/04/2020 1309   CO2 27 07/04/2020 1309   CO2 28 07/28/2016 1334   GLUCOSE 122 (H) 07/04/2020 1309   GLUCOSE 160 (H) 07/28/2016 1334   BUN 17 07/04/2020 1309   BUN  20 08/11/2018 1425   BUN 9.5 07/28/2016 1334   CREATININE 1.13 (H) 07/04/2020 1309   CREATININE 1.0 07/28/2016 1334   CALCIUM 9.7 07/04/2020 1309   CALCIUM 9.6 07/28/2016 1334   PROT 7.6 07/04/2020 1309   PROT 7.4 07/28/2016 1334   ALBUMIN 3.6 07/04/2020 1309   ALBUMIN 4.0 07/28/2016 1334   AST 20 07/04/2020 1309   AST 21 07/28/2016 1334   ALT 10 07/04/2020 1309   ALT 24 07/28/2016  1334   ALKPHOS 78 07/04/2020 1309   ALKPHOS 96 07/28/2016 1334   BILITOT 1.1 07/04/2020 1309   BILITOT 1.21 (H) 07/28/2016 1334   GFRNONAA 46 (L) 07/04/2020 1309   GFRAA 50 (L) 10/03/2019 1355    INo results found for: SPEP, UPEP  Lab Results  Component Value Date   WBC 6.6 07/26/2020   NEUTROABS 4.8 07/26/2020   HGB 11.5 (L) 07/26/2020   HCT 35.0 (L) 07/26/2020   MCV 105.1 (H) 07/26/2020   PLT 248 07/26/2020      Chemistry      Component Value Date/Time   NA 139 07/04/2020 1309   NA 134 08/11/2018 1425   NA 137 07/28/2016 1334   K 3.6 07/04/2020 1309   K 3.8 07/28/2016 1334   CL 102 07/04/2020 1309   CO2 27 07/04/2020 1309   CO2 28 07/28/2016 1334   BUN 17 07/04/2020 1309   BUN 20 08/11/2018 1425   BUN 9.5 07/28/2016 1334   CREATININE 1.13 (H) 07/04/2020 1309   CREATININE 1.0 07/28/2016 1334      Component Value Date/Time   CALCIUM 9.7 07/04/2020 1309   CALCIUM 9.6 07/28/2016 1334   ALKPHOS 78 07/04/2020 1309   ALKPHOS 96 07/28/2016 1334   AST 20 07/04/2020 1309   AST 21 07/28/2016 1334   ALT 10 07/04/2020 1309   ALT 24 07/28/2016 1334   BILITOT 1.1 07/04/2020 1309   BILITOT 1.21 (H) 07/28/2016 1334       No results found for: LABCA2  No components found for: LABCA125  No results for input(s): INR in the last 168 hours.  Urinalysis    Component Value Date/Time   COLORURINE YELLOW 10/07/2018 1535   APPEARANCEUR CLEAR 10/07/2018 1535   LABSPEC 1.012 10/07/2018 1535   PHURINE 6.0 10/07/2018 1535   GLUCOSEU NEGATIVE 10/07/2018 1535   GLUCOSEU NEGATIVE 01/17/2015 1415   HGBUR NEGATIVE 10/07/2018 [place] 10/07/2018 1535   KETONESUR NEGATIVE 10/07/2018 1535   PROTEINUR 30 (A) 10/07/2018 1535   UROBILINOGEN 0.2 01/17/2015 1415   NITRITE NEGATIVE 10/07/2018 1535   LEUKOCYTESUR NEGATIVE 10/07/2018 1535    STUDIES: No results found.   ELIGIBLE FOR AVAILABLE RESEARCH PROTOCOL: no  ASSESSMENT: 87 y.o. [place] woman status  post left breast upper outer quadrant biopsy 06/11/2015 for a papillary lesion with unclear margins.  (1) status post left lumpectomy 07/25/2015 for an mpT2 cN0, stage IIA  invasive ductal carcinoma,  grade I-II  estrogen and progesterone receptor strongly positive, HER-2 not amplified   (a) additional surgery 09/05/2015 found some residual ductal carcinoma in situ but the margins were clear.  (2) no sentinel lymph node sampling or adjuvant radiation planned  (3) adjuvant anastrozole started 08/10/2015, completed May 2022  (4) squamous cell carcinoma of the skin in both lower extremities  (a) started cemiplimab/ Libtayo 07/04/2020, repeated every 21 days x 8  (b) consider cetuximab if Libtayo ineffective or not tolerated   PLAN:  Zaray is doing better as far as the  diarrhea is concerned and I think if she starts taking the colestipol every morning and keep the Imodium as needed as she will be well ahead of it.  If she develops frank constipation and has no bowel movement for 2 days she will stop the colestipol.  We are proceeding with treatment today.  My hope is to get her through at least 4 doses of cemiplimab and then reassess.  She will see me again in 3 weeks.  They know to call for any other issue that may develop before that visit  Total encounter time 20 minutes.*   Lamonica Trueba, Virgie Dad, MD  07/26/20 12:23 PM Medical Oncology and Hematology [facility] [place], [place] [address] Tel. [phone]    Fax. [phone]   I, Wilburn Mylar, am acting as scribe for Dr. Virgie Dad. Ibtisam Benge.  I, Lurline Del MD, have reviewed the above documentation for accuracy and completeness, and I agree with the above.   *Total Encounter Time as defined by the Centers for Medicare and Medicaid Services includes, in addition to the face-to-face time of a patient visit (documented in the note above) non-face-to-face time: obtaining and reviewing outside history,  ordering and reviewing medications, tests or procedures, care coordination (communications with other health care professionals or caregivers) and documentation in the medical record.

## 2020-07-26 ENCOUNTER — Inpatient Hospital Stay: Payer: Medicare Other | Attending: Oncology | Admitting: Oncology

## 2020-07-26 ENCOUNTER — Inpatient Hospital Stay: Payer: Medicare Other

## 2020-07-26 ENCOUNTER — Other Ambulatory Visit: Payer: Self-pay

## 2020-07-26 ENCOUNTER — Telehealth: Payer: Self-pay | Admitting: Oncology

## 2020-07-26 VITALS — BP 129/68 | HR 70 | Temp 97.6°F | Resp 18 | Ht 65.0 in | Wt 154.4 lb

## 2020-07-26 DIAGNOSIS — C44729 Squamous cell carcinoma of skin of left lower limb, including hip: Secondary | ICD-10-CM | POA: Insufficient documentation

## 2020-07-26 DIAGNOSIS — C50412 Malignant neoplasm of upper-outer quadrant of left female breast: Secondary | ICD-10-CM

## 2020-07-26 DIAGNOSIS — C4490 Unspecified malignant neoplasm of skin, unspecified: Secondary | ICD-10-CM

## 2020-07-26 DIAGNOSIS — C44721 Squamous cell carcinoma of skin of unspecified lower limb, including hip: Secondary | ICD-10-CM

## 2020-07-26 DIAGNOSIS — Z5112 Encounter for antineoplastic immunotherapy: Secondary | ICD-10-CM | POA: Diagnosis not present

## 2020-07-26 DIAGNOSIS — Z170 Estrogen receptor positive status [ER+]: Secondary | ICD-10-CM

## 2020-07-26 DIAGNOSIS — C44722 Squamous cell carcinoma of skin of right lower limb, including hip: Secondary | ICD-10-CM | POA: Diagnosis not present

## 2020-07-26 DIAGNOSIS — Z79899 Other long term (current) drug therapy: Secondary | ICD-10-CM | POA: Insufficient documentation

## 2020-07-26 DIAGNOSIS — E064 Drug-induced thyroiditis: Secondary | ICD-10-CM

## 2020-07-26 DIAGNOSIS — C50912 Malignant neoplasm of unspecified site of left female breast: Secondary | ICD-10-CM

## 2020-07-26 LAB — COMPREHENSIVE METABOLIC PANEL
ALT: 18 U/L (ref 0–44)
AST: 23 U/L (ref 15–41)
Albumin: 3.2 g/dL — ABNORMAL LOW (ref 3.5–5.0)
Alkaline Phosphatase: 73 U/L (ref 38–126)
Anion gap: 10 (ref 5–15)
BUN: 15 mg/dL (ref 8–23)
CO2: 29 mmol/L (ref 22–32)
Calcium: 9.7 mg/dL (ref 8.9–10.3)
Chloride: 100 mmol/L (ref 98–111)
Creatinine, Ser: 1.17 mg/dL — ABNORMAL HIGH (ref 0.44–1.00)
GFR, Estimated: 44 mL/min — ABNORMAL LOW (ref >60)
Glucose, Bld: 109 mg/dL — ABNORMAL HIGH (ref 70–99)
Potassium: 4.3 mmol/L (ref 3.5–5.1)
Sodium: 139 mmol/L (ref 135–145)
Total Bilirubin: 0.7 mg/dL (ref 0.3–1.2)
Total Protein: 7.3 g/dL (ref 6.5–8.1)

## 2020-07-26 LAB — CBC WITH DIFFERENTIAL/PLATELET
Abs Immature Granulocytes: 0.02 10*3/uL (ref 0.00–0.07)
Basophils Absolute: 0 10*3/uL (ref 0.0–0.1)
Basophils Relative: 1 %
Eosinophils Absolute: 0.1 10*3/uL (ref 0.0–0.5)
Eosinophils Relative: 1 %
HCT: 35 % — ABNORMAL LOW (ref 36.0–46.0)
Hemoglobin: 11.5 g/dL — ABNORMAL LOW (ref 12.0–15.0)
Immature Granulocytes: 0 %
Lymphocytes Relative: 17 %
Lymphs Abs: 1.1 10*3/uL (ref 0.7–4.0)
MCH: 34.5 pg — ABNORMAL HIGH (ref 26.0–34.0)
MCHC: 32.9 g/dL (ref 30.0–36.0)
MCV: 105.1 fL — ABNORMAL HIGH (ref 80.0–100.0)
Monocytes Absolute: 0.6 10*3/uL (ref 0.1–1.0)
Monocytes Relative: 9 %
Neutro Abs: 4.8 10*3/uL (ref 1.7–7.7)
Neutrophils Relative %: 72 %
Platelets: 248 10*3/uL (ref 150–400)
RBC: 3.33 MIL/uL — ABNORMAL LOW (ref 3.87–5.11)
RDW: 12.8 % (ref 11.5–15.5)
WBC: 6.6 10*3/uL (ref 4.0–10.5)
nRBC: 0 % (ref 0.0–0.2)

## 2020-07-26 LAB — TSH: TSH: 0.579 u[IU]/mL (ref 0.308–3.960)

## 2020-07-26 MED ORDER — SODIUM CHLORIDE 0.9 % IV SOLN
Freq: Once | INTRAVENOUS | Status: AC
  Filled 2020-07-26: qty 250

## 2020-07-26 MED ORDER — SODIUM CHLORIDE 0.9 % IV SOLN
350.0000 mg | Freq: Once | INTRAVENOUS | Status: AC
  Administered 2020-07-26: 350 mg via INTRAVENOUS
  Filled 2020-07-26: qty 7

## 2020-07-26 NOTE — Addendum Note (Signed)
Addended by: Chauncey Cruel on: 07/26/2020 01:43 PM   Modules accepted: Orders

## 2020-07-26 NOTE — Patient Instructions (Addendum)
[facility] MEDICAL [facility]COLOG[facility]  Discharge Instructions: ?Thank you for choosing [facility] to provide your oncology and hematology care.  ? ?If you have a lab appointment with the Cancer Center, please go directly to the Cancer Center and check in at the registration area. ?  ?Wear comfortable clothing and clothing appropriate for easy access to any Portacath or PICC line.  ? ?We strive to give you quality time with your provider. You may need to reschedule your appointment if you arrive late (15 or more minutes).  Arriving late affects you and other patients whose appointments are after yours.  Also, if you miss three or more appointments without notifying the office, you may be dismissed from the clinic at the provider?s discretion.    ?  ?For prescription refill requests, have your pharmacy contact our office and allow 72 hours for refills to be completed.   ? ?Today you received the following chemotherapy and/or immunotherapy agent: Cemiplimab (Libtayo) ?  ?To help prevent nausea and vomiting after your treatment, we encourage you to take your nausea medication as directed. ? ?BELOW ARE SYMPTOMS THAT SHOULD BE REPORTED IMMEDIATELY: ?*FEVER GREATER THAN 100.4 F (38 ?C) OR HIGHER ?*CHILLS OR SWEATING ?*NAUSEA AND VOMITING THAT IS NOT CONTROLLED WITH YOUR NAUSEA MEDICATION ?*UNUSUAL SHORTNESS OF BREATH ?*UNUSUAL BRUISING OR BLEEDING ?*URINARY PROBLEMS (pain or burning when urinating, or frequent urination) ?*BOWEL PROBLEMS (unusual diarrhea, constipation, pain near the anus) ?TENDERNESS IN MOUTH AND THROAT WITH OR WITHOUT PRESENCE OF ULCERS (sore throat, sores in mouth, or a toothache) ?UNUSUAL RASH, SWELLING OR PAIN  ?UNUSUAL VAGINAL DISCHARGE OR ITCHING  ? ?Items with * indicate a potential emergency and should be followed up as soon as possible or go to the Emergency Department if any problems should occur. ? ?Please show the CHEMOTHERAPY ALERT CARD or IMMUNOTHERAPY ALERT CARD at  check-in to the Emergency Department and triage nurse. ? ?Should you have questions after your visit or need to cancel or reschedule your appointment, please contact [facility]  Dept: [phone]  and follow the prompts.  Office hours are 8:00 a.m. to 4:30 p.m. Monday - Friday. Please note that voicemails left after 4:00 p.m. may not be returned until the following business day.  We are closed weekends and major holidays. You have access to a nurse at all times for urgent questions. Please call the main number to the clinic Dept: [phone] and follow the prompts. ? ? ?For any non-urgent questions, you may also contact your provider using MyChart. We now offer e-Visits for anyone 18 and older to request care online for non-urgent symptoms. For details visit [email or web address]. ?  ?Also download the MyChart app! Go to the app store, search "MyChart", open the app, select [place], and log in with your MyChart username and password. ? ?Due to Covid, a mask is required upon entering the hospital/clinic. If you do not have a mask, one will be given to you upon arrival. For doctor visits, patients may have 1 support person aged 18 or older with them. For treatment visits, patients cannot have anyone with them due to current Covid guidelines and our immunocompromised population.  ? ?

## 2020-07-31 ENCOUNTER — Telehealth: Payer: Self-pay | Admitting: Internal Medicine

## 2020-07-31 DIAGNOSIS — Z4800 Encounter for change or removal of nonsurgical wound dressing: Secondary | ICD-10-CM | POA: Diagnosis not present

## 2020-07-31 DIAGNOSIS — L97812 Non-pressure chronic ulcer of other part of right lower leg with fat layer exposed: Secondary | ICD-10-CM | POA: Diagnosis not present

## 2020-07-31 DIAGNOSIS — C44722 Squamous cell carcinoma of skin of right lower limb, including hip: Secondary | ICD-10-CM | POA: Diagnosis not present

## 2020-07-31 DIAGNOSIS — C44729 Squamous cell carcinoma of skin of left lower limb, including hip: Secondary | ICD-10-CM | POA: Diagnosis not present

## 2020-07-31 DIAGNOSIS — I872 Venous insufficiency (chronic) (peripheral): Secondary | ICD-10-CM | POA: Diagnosis not present

## 2020-07-31 DIAGNOSIS — L97822 Non-pressure chronic ulcer of other part of left lower leg with fat layer exposed: Secondary | ICD-10-CM | POA: Diagnosis not present

## 2020-07-31 NOTE — Telephone Encounter (Signed)
LVM for pt to rtn my call to schedule AWV with [facility]. Please schedule appt if pt calls the office.  

## 2020-08-01 ENCOUNTER — Encounter: Payer: Self-pay | Admitting: Oncology

## 2020-08-07 ENCOUNTER — Ambulatory Visit: Payer: Medicare Other

## 2020-08-09 ENCOUNTER — Other Ambulatory Visit: Payer: Self-pay

## 2020-08-09 ENCOUNTER — Encounter (HOSPITAL_BASED_OUTPATIENT_CLINIC_OR_DEPARTMENT_OTHER): Payer: Medicare Other | Attending: Internal Medicine | Admitting: Internal Medicine

## 2020-08-09 DIAGNOSIS — L97829 Non-pressure chronic ulcer of other part of left lower leg with unspecified severity: Secondary | ICD-10-CM | POA: Diagnosis not present

## 2020-08-09 DIAGNOSIS — L97919 Non-pressure chronic ulcer of unspecified part of right lower leg with unspecified severity: Secondary | ICD-10-CM | POA: Diagnosis present

## 2020-08-09 DIAGNOSIS — I509 Heart failure, unspecified: Secondary | ICD-10-CM | POA: Insufficient documentation

## 2020-08-09 DIAGNOSIS — L97819 Non-pressure chronic ulcer of other part of right lower leg with unspecified severity: Secondary | ICD-10-CM | POA: Diagnosis not present

## 2020-08-09 DIAGNOSIS — C4492 Squamous cell carcinoma of skin, unspecified: Secondary | ICD-10-CM

## 2020-08-09 DIAGNOSIS — I110 Hypertensive heart disease with heart failure: Secondary | ICD-10-CM | POA: Insufficient documentation

## 2020-08-09 DIAGNOSIS — I872 Venous insufficiency (chronic) (peripheral): Secondary | ICD-10-CM | POA: Insufficient documentation

## 2020-08-09 DIAGNOSIS — E039 Hypothyroidism, unspecified: Secondary | ICD-10-CM | POA: Insufficient documentation

## 2020-08-10 ENCOUNTER — Encounter: Payer: Self-pay | Admitting: Oncology

## 2020-08-10 NOTE — Progress Notes (Signed)
[name] ([id number]) , Visit Report for 08/09/2020 Arrival Information Details Patient Name: Date of Service: [name], [place]. 08/09/2020 1:30 PM Medical Record Number: [id number] Patient Account Number: [id number] Date of Birth/Sex: Treating RN: 1925-10-05 (87 y.o. [name], [name] Primary Care Mikaylee Arseneau: [name] Other Clinician: Referring Adel Burch: Treating Boni Maclellan/Extender: [name] in Treatment: 8 Visit Information History Since Last Visit Added or deleted any medications: No Patient Arrived: Walker Any new allergies or adverse reactions: No Arrival Time: 14:34 Had a fall or experienced change in No Accompanied By: Daughter activities of daily living that may affect Transfer Assistance: None risk of falls: Patient Identification Verified: Yes Signs or symptoms of abuse/neglect since last visito No Secondary Verification Process Completed: Yes Hospitalized since last visit: No Patient Requires Transmission-Based No Implantable device outside of the clinic excluding No Precautions: cellular tissue based products placed in the center Patient Has Alerts: Yes since last visit: Patient Alerts: R ABI = Non compressible Has Dressing in Place as Prescribed: Yes L ABI = Non compressible Has Compression in Place as Prescribed: Yes Pain Present Now: No Electronic Signature(s) Signed: 08/10/2020 10:49:45 AM By: Sandre Kitty Entered By: Sandre Kitty on 08/09/2020 14:36:00 -------------------------------------------------------------------------------- Clinic Level of Care Assessment Details Patient Name: Date of Service: [name], [name] 08/09/2020 1:30 PM Medical Record Number: [id number] Patient Account Number: [id number] Date of Birth/Sex: Treating RN: 10/15/1925 (87 y.o. [name], [name] Primary Care [place] Hair: [name] Other Clinician: Referring Hadden Steig: Treating Ebon Ketchum/Extender: [name] in Treatment: 8 Clinic Level of Care Assessment Items TOOL 4 Quantity Score X- 1 0 Use when only an EandM is performed on FOLLOW-UP visit ASSESSMENTS - Nursing Assessment / Reassessment X- 1 10 Reassessment of Co-morbidities (includes updates in patient status) X- 1 5 Reassessment of Adherence to Treatment Plan ASSESSMENTS - Wound and Skin A ssessment / Reassessment '[]'$  - 0 Simple Wound Assessment / Reassessment - one wound X- 2 5 Complex Wound Assessment / Reassessment - multiple wounds X- 1 10 Dermatologic / Skin Assessment (not related to wound area) ASSESSMENTS - Focused Assessment X- 1 5 Circumferential Edema Measurements - multi extremities '[]'$  - 0 Nutritional Assessment / Counseling / Intervention '[]'$  - 0 Lower Extremity Assessment (monofilament, tuning fork, pulses) '[]'$  - 0 Peripheral Arterial Disease Assessment (using hand held doppler) ASSESSMENTS - Ostomy and/or Continence Assessment and Care '[]'$  - 0 Incontinence Assessment and Management '[]'$  - 0 Ostomy Care Assessment and Management (repouching, etc.) PROCESS - Coordination of Care '[]'$  - 0 Simple Patient / Family Education for ongoing care X- 1 20 Complex (extensive) Patient / Family Education for ongoing care X- 1 10 Staff obtains Programmer, systems, Records, T Results / Process Orders est '[]'$  - 0 Staff telephones HHA, Nursing Homes / Clarify orders / etc '[]'$  - 0 Routine Transfer to another Facility (non-emergent condition) '[]'$  - 0 Routine Hospital Admission (non-emergent condition) '[]'$  - 0 New Admissions / Biomedical engineer / Ordering NPWT Apligraf, etc. , '[]'$  - 0 Emergency Hospital Admission (emergent condition) X- 1 10 Simple Discharge Coordination '[]'$  - 0 Complex (extensive) Discharge Coordination PROCESS - Special Needs '[]'$  - 0 Pediatric / Minor Patient Management '[]'$  - 0 Isolation Patient Management '[]'$  - 0 Hearing / Language / Visual special needs '[]'$  - 0 Assessment of  Community assistance (transportation, D/C planning, etc.) '[]'$  - 0 Additional assistance / Altered mentation '[]'$  - 0 Support Surface(s) Assessment (bed, cushion, seat, etc.) INTERVENTIONS - Wound Cleansing /  Measurement X - Simple Wound Cleansing - one wound 1 5 '[]'$  - 0 Complex Wound Cleansing - multiple wounds X- 1 5 Wound Imaging (photographs - any number of wounds) '[]'$  - 0 Wound Tracing (instead of photographs) '[]'$  - 0 Simple Wound Measurement - one wound X- 1 5 Complex Wound Measurement - multiple wounds INTERVENTIONS - Wound Dressings X - Small Wound Dressing one or multiple wounds 1 10 '[]'$  - 0 Medium Wound Dressing one or multiple wounds '[]'$  - 0 Large Wound Dressing one or multiple wounds X- 1 5 Application of Medications - topical '[]'$  - 0 Application of Medications - injection INTERVENTIONS - Miscellaneous '[]'$  - 0 External ear exam '[]'$  - 0 Specimen Collection (cultures, biopsies, blood, body fluids, etc.) '[]'$  - 0 Specimen(s) / Culture(s) sent or taken to Lab for analysis '[]'$  - 0 Patient Transfer (multiple staff / Civil Service fast streamer / Similar devices) '[]'$  - 0 Simple Staple / Suture removal (25 or less) '[]'$  - 0 Complex Staple / Suture removal (26 or more) '[]'$  - 0 Hypo / Hyperglycemic Management (close monitor of Blood Glucose) '[]'$  - 0 Ankle / Brachial Index (ABI) - do not check if billed separately X- 1 5 Vital Signs Has the patient been seen at the hospital within the last three years: Yes Total Score: 115 Level Of Care: New/Established - Level 3 Electronic Signature(s) Signed: 08/09/2020 7:48:44 PM By: Rhae Hammock RN Entered By: Rhae Hammock on 08/09/2020 17:58:17 -------------------------------------------------------------------------------- Encounter Discharge Information Details Patient Name: Date of Service: [name], [name] 08/09/2020 1:30 PM Medical Record Number: [id number] Patient Account Number: [id number] Date of Birth/Sex: Treating RN: [birth year] (87 y.o.  [name] Primary Care Chadric Kimberley: [name] Other Clinician: Referring Khi Mcmillen: Treating Suzann Lazaro/Extender: [name] in Treatment: 8 Encounter Discharge Information Items Discharge Condition: Stable Ambulatory Status: Walker Discharge Destination: Home Transportation: Private Auto Accompanied By: Daughter Schedule Follow-up Appointment: Yes Clinical Summary of Care: Provided on 08/09/2020 Form Type Recipient Paper Patient Patient Electronic Signature(s) Signed: 08/09/2020 4:47:25 PM By: Lorrin Jackson Entered By: Lorrin Jackson on 08/09/2020 16:47:25 -------------------------------------------------------------------------------- Lower Extremity Assessment Details Patient Name: Date of Service: [name], [place]. 08/09/2020 1:30 PM Medical Record Number: [id number] Patient Account Number: [id number] Date of Birth/Sex: Treating RN: 1925/01/24 (87 y.o. [name] Primary Care Givanni Staron: [name] Other Clinician: Referring Alaynah Schutter: Treating Smith Mcnicholas/Extender: [name] in Treatment: 8 Edema Assessment Assessed: [Left: Yes] [Right: Yes] Edema: [Left: Yes] [Right: Yes] Calf Left: Right: Point of Measurement: 30 cm From Medial Instep 42 cm 45 cm Ankle Left: Right: Point of Measurement: 9 cm From Medial Instep 22.8 cm 22.5 cm Vascular Assessment Pulses: Dorsalis Pedis Palpable: [Left:Yes] [Right:Yes] Electronic Signature(s) Signed: 08/09/2020 6:43:27 PM By: Lorrin Jackson Entered By: Lorrin Jackson on 08/09/2020 14:47:40 -------------------------------------------------------------------------------- Multi Wound Chart Details Patient Name: Date of Service: [name], Caridad A. 08/09/2020 1:30 PM Medical Record Number: [id number] Patient Account Number: [id number] Date of Birth/Sex: Treating RN: 08/29/1925 (87 y.o. [name] Primary Care Chidinma Clites: [name] Other Clinician: Referring Shia Eber: Treating Jama Krichbaum/Extender: [name] in Treatment: 8 Vital Signs Height(in): 55 Pulse(bpm): 78 Weight(lbs): 163 Blood Pressure(mmHg): 153/80 Body Mass Index(BMI): 27 Temperature(F): 98.2 Respiratory Rate(breaths/min): 20 Photos: [N/A:N/A] Right, Anterior Lower Leg Left, Anterior Lower Leg N/A Wound Location: Blister Blister N/A Wounding Event: Malignant Wound Malignant Wound N/A Primary Etiology: Anemia, Arrhythmia, Congestive Heart Anemia, Arrhythmia, Congestive Heart N/A Comorbid History: Failure, Hypertension, Colitis, Failure,  Hypertension, Colitis, Osteoarthritis Osteoarthritis 04/20/2020 04/20/2020 N/A Date Acquired: 8 8 N/A Weeks of Treatment: Open Open N/A Wound Status: 3.5x2x0.1 2x1x0.1 N/A Measurements L x W x D (cm) 5.498 1.571 N/A A (cm) : rea 0.55 0.157 N/A Volume (cm) : 13.00% 48.80% N/A % Reduction in Area: 71.00% 48.90% N/A % Reduction in Volume: Full Thickness Without Exposed Full Thickness Without Exposed N/A Classification: Support Structures Support Structures Medium Medium N/A Exudate Amount: Serosanguineous Serosanguineous N/A Exudate Type: red, brown red, brown N/A Exudate Color: Distinct, outline attached Distinct, outline attached N/A Wound Margin: Large (67-100%) Large (67-100%) N/A Granulation Amount: Red, Pink Red, Pink N/A Granulation Quality: Small (1-33%) Small (1-33%) N/A Necrotic Amount: Fat Layer (Subcutaneous Tissue): Yes Fat Layer (Subcutaneous Tissue): Yes N/A Exposed Structures: Fascia: No Fascia: No Tendon: No Tendon: No Muscle: No Muscle: No Joint: No Joint: No Bone: No Bone: No Small (1-33%) Medium (34-66%) N/A Epithelialization: Treatment Notes Electronic Signature(s) Signed: 08/09/2020 4:19:29 PM By: Kalman Shan DO Signed: 08/09/2020 6:31:47 PM By: Deon Pilling Entered By: Kalman Shan on 08/09/2020  15:29:15 -------------------------------------------------------------------------------- Multi-Disciplinary Care Plan Details Patient Name: Date of Service: [name], Wile[place]ta A. 08/09/2020 1:30 PM Medical Record Number: [id number] Patient Account Number: [id number] Date of Birth/Sex: Treating RN: 1925/04/12 (87 y.o. [name], [name] Primary Care Dawna Jakes: [name] Other Clinician: Referring Shamila Lerch: Treating Taylormarie Register/Extender: [name] in Treatment: 8 [place] reviewed with physician Active Inactive Wound/Skin Impairment Nursing Diagnoses: Impaired tissue integrity Knowledge deficit related to ulceration/compromised skin integrity Goals: Patient/caregiver will verbalize understanding of skin care regimen Date Initiated: 06/11/2020 Target Resolution Date: 08/10/2020 Goal Status: Active Interventions: Assess patient/caregiver ability to obtain necessary supplies Assess patient/caregiver ability to perform ulcer/skin care regimen upon admission and as needed Assess ulceration(s) every visit Provide education on ulcer and skin care Notes: Electronic Signature(s) Signed: 08/09/2020 7:48:44 PM By: Rhae Hammock RN Entered By: Rhae Hammock on 08/09/2020 17:54:22 -------------------------------------------------------------------------------- Pain Assessment Details Patient Name: Date of Service: [name], Lynsi A. 08/09/2020 1:30 PM Medical Record Number: [id number] Patient Account Number: [id number] Date of Birth/Sex: Treating RN: 09/22/1925 (87 y.o. [name] Primary Care Gayna Braddy: [name] Other Clinician: Referring Hussain Maimone: Treating Osman Calzadilla/Extender: [name] in Treatment: 8 Active Problems Location of Pain Severity and Description of Pain Patient Has Paino No Patient Has Paino No Site Locations Pain Management and Medication Current Pain  Management: Electronic Signature(s) Signed: 08/09/2020 6:31:47 PM By: Deon Pilling Signed: 08/10/2020 10:49:45 AM By: Sandre Kitty Entered By: Sandre Kitty on 08/09/2020 14:37:13 -------------------------------------------------------------------------------- Patient/Caregiver Education Details Patient Name: Date of Service: [name], Tonna A. 7/21/2022andnbsp1:30 PM Medical Record Number: [id number] Patient Account Number: [id number] Date of Birth/Gender: Treating RN: 11-11-1925 (87 y.o. [name], [name] Primary Care Physician: [name] Other Clinician: Referring Physician: Treating Physician/Extender: [name] in Treatment: 8 Education Assessment Education Provided To: Patient Education Topics Provided Wound/Skin Impairment: Methods: Explain/Verbal Responses: State content correctly Electronic Signature(s) Signed: 08/09/2020 7:48:44 PM By: Rhae Hammock RN Entered By: Rhae Hammock on 08/09/2020 17:56:54 -------------------------------------------------------------------------------- Wound Assessment Details Patient Name: Date of Service: [name], [place]. 08/09/2020 1:30 PM Medical Record Number: [id number] Patient Account Number: [id number] Date of Birth/Sex: Treating RN: 12-23-1925 (87 y.o. [name] Primary Care Cymone Yeske: Other Clinician: [name] Referring Janeen Watson: Treating Johnell Bas/Extender: [name] in Treatment: 8 Wound Status Wound Number: 6 Primary Malignant Wound Etiology: Wound Location: Right, Anterior Lower Leg Wound  Open Wounding Event: Blister Status: Date Acquired: 04/20/2020 Comorbid Anemia, Arrhythmia, Congestive Heart Failure, Hypertension, Weeks Of Treatment: 8 History: Colitis, Osteoarthritis Clustered Wound: No Photos Wound Measurements Length: (cm) 3.5 Width: (cm) 2 Depth: (cm) 0.1 Area: (cm) 5.498 Volume: (cm)  0.55 % Reduction in Area: 13% % Reduction in Volume: 71% Epithelialization: Small (1-33%) Tunneling: No Undermining: No Wound Description Classification: Full Thickness Without Exposed Support Structures Wound Margin: Distinct, outline attached Exudate Amount: Medium Exudate Type: Serosanguineous Exudate Color: red, brown Foul Odor After Cleansing: No Slough/Fibrino Yes Wound Bed Granulation Amount: Large (67-100%) Exposed Structure Granulation Quality: Red, Pink Fascia Exposed: No Necrotic Amount: Small (1-33%) Fat Layer (Subcutaneous Tissue) Exposed: Yes Tendon Exposed: No Muscle Exposed: No Joint Exposed: No Bone Exposed: No Treatment Notes Wound #6 (Lower Leg) Wound Laterality: Right, Anterior Cleanser Soap and Water Discharge Instruction: May shower and wash wound with dial antibacterial soap and water prior to dressing change. Peri-Wound Care Triamcinolone 15 (g) Discharge Instruction: Use triamcinolone 15 (g) in clinic only. Sween Lotion (Moisturizing lotion) Discharge Instruction: Apply moisturizing lotion as directed Topical Primary Dressing KerraCel Ag Gelling Fiber Dressing, 2x2 in (silver alginate) Discharge Instruction: Apply silver alginate to wound bed as instructed Secondary Dressing Bordered Gauze, 4x4 in Discharge Instruction: Apply over primary dressing as directed. Secured With Compression Wrap Compression Stockings Environmental education officer) Signed: 08/09/2020 6:43:27 PM By: Lorrin Jackson Entered By: Lorrin Jackson on 08/09/2020 14:48:20 -------------------------------------------------------------------------------- Wound Assessment Details Patient Name: Date of Service: [name], [place]. 08/09/2020 1:30 PM Medical Record Number: [id number] Patient Account Number: [id number] Date of Birth/Sex: Treating RN: [birth year] (87 y.o. [name] Primary Care Ghina Bittinger: [name] Other Clinician: Referring Aliha Diedrich: Treating  Levina Boyack/Extender: [name] in Treatment: 8 Wound Status Wound Number: 7 Primary Malignant Wound Etiology: Wound Location: Left, Anterior Lower Leg Wound Open Wounding Event: Blister Status: Date Acquired: 04/20/2020 Comorbid Anemia, Arrhythmia, Congestive Heart Failure, Hypertension, Weeks Of Treatment: 8 History: Colitis, Osteoarthritis Clustered Wound: No Photos Wound Measurements Length: (cm) 2 Width: (cm) 1 Depth: (cm) 0.1 Area: (cm) 1.571 Volume: (cm) 0.157 % Reduction in Area: 48.8% % Reduction in Volume: 48.9% Epithelialization: Medium (34-66%) Tunneling: No Undermining: No Wound Description Classification: Full Thickness Without Exposed Support Structures Wound Margin: Distinct, outline attached Exudate Amount: Medium Exudate Type: Serosanguineous Exudate Color: red, brown Foul Odor After Cleansing: No Slough/Fibrino Yes Wound Bed Granulation Amount: Large (67-100%) Exposed Structure Granulation Quality: Red, Pink Fascia Exposed: No Necrotic Amount: Small (1-33%) Fat Layer (Subcutaneous Tissue) Exposed: Yes Necrotic Quality: Adherent Slough Tendon Exposed: No Muscle Exposed: No Joint Exposed: No Bone Exposed: No Treatment Notes Wound #7 (Lower Leg) Wound Laterality: Left, Anterior Cleanser Soap and Water Discharge Instruction: May shower and wash wound with dial antibacterial soap and water prior to dressing change. Peri-Wound Care Triamcinolone 15 (g) Discharge Instruction: Use triamcinolone 15 (g) in clinic only. Sween Lotion (Moisturizing lotion) Discharge Instruction: Apply moisturizing lotion as directed Topical Primary Dressing KerraCel Ag Gelling Fiber Dressing, 2x2 in (silver alginate) Discharge Instruction: Apply silver alginate to wound bed as instructed Secondary Dressing Bordered Gauze, 4x4 in Discharge Instruction: Apply over primary dressing as directed. Secured With Compression Wrap Compression  Stockings Environmental education officer) Signed: 08/09/2020 6:43:27 PM By: Lorrin Jackson Signed: 08/10/2020 10:49:45 AM By: Sandre Kitty Entered By: Sandre Kitty on 08/09/2020 14:57:06 -------------------------------------------------------------------------------- Vitals Details Patient Name: Date of Service: [name], [place]. 08/09/2020 1:30 PM Medical Record Number: [id number] Patient Account Number: [id number] Date of Birth/Sex: Treating RN:  16-Aug-1925 (87 y.o. [name], [name] Primary Care Rohini Jaroszewski: [name] Other Clinician: Referring Reana Chacko: Treating Eastin Swing/Extender: [name] in Treatment: 8 Vital Signs Time Taken: 14:38 Temperature (F): 98.2 Height (in): 65 Pulse (bpm): 76 Weight (lbs): 163 Respiratory Rate (breaths/min): 20 Body Mass Index (BMI): 27.1 Blood Pressure (mmHg): 153/80 Reference Range: 80 - 120 mg / dl Electronic Signature(s) Signed: 08/09/2020 6:43:27 PM By: Lorrin Jackson Entered By: Lorrin Jackson on 08/09/2020 14:50:17

## 2020-08-13 NOTE — Progress Notes (Signed)
[name] ([id number]) , Visit Report for 08/09/2020 Chief Complaint Document Details Patient Name: Date of Service: [name], [place]. 08/09/2020 1:30 PM Medical Record Number: [id number] Patient Account Number: [id number] Date of Birth/Sex: Treating RN: 07-06-1925 (87 y.o. [name] Primary Care Provider: [name] Other Clinician: Referring Provider: Treating Provider/Extender: [name] in Treatment: 8 Information Obtained from: Patient Chief Complaint Bilateral lower extremity wounds that have been biopsied and positive for squamous cell carcinoma Electronic Signature(s) Signed: 08/09/2020 4:19:29 PM By: Kalman Shan DO Entered By: Kalman Shan on 08/09/2020 15:29:24 -------------------------------------------------------------------------------- HPI Details Patient Name: Date of Service: [name], [name]. 08/09/2020 1:30 PM Medical Record Number: [id number] Patient Account Number: [id number] Date of Birth/Sex: Treating RN: [birth year] (87 y.o. 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 She states that the wound on the right has been present for a year. The 1 on the left has opened up 1 month ago. She is followed with oncology for this issue as she had biopsies that showed squamous cell carcinoma. She is also seeing radiation oncology for treatment options. She presents today because she would like for her wounds to be healed by  [place]. She currently denies signs of infection. 6/1; patient presents for 1 week follow-up. She states she has tolerated the leg wraps well. She states these do not bother her and is happy to continue with them. She is scheduled to see her oncologist today to go over treatment options for the bilateral lower extremity squamous cell carcinoma. Radiation is currently not a recommended option. Patient states she overall feels well. 6/22; patient presents for 3-week follow-up. She has tolerated the wraps well until her last wrap where she states they were uncomfortable. She attributes this to the home health nurse. She denies signs of infection. She has started her first treatment of antibody infusions for her Bilateral lower extremity squamous cell carcinoma. She has no complaints today. 7/21; patient presents for 1 month follow-up. Unfortunately she has not had good experience with her wrap changes with home health. She would like to do her own dressing changes. She continues to do her antibody infusions. She denies signs of infection. Electronic Signature(s) Signed: 08/09/2020 4:19:29 PM By: Kalman Shan DO Entered By: Kalman Shan on 08/09/2020 15:30:01 -------------------------------------------------------------------------------- Physical Exam Details Patient Name: Date of Service: [name], [place]. 08/09/2020 1:30 PM Medical Record Number: [id number] Patient Account Number: [id number] Date of Birth/Sex: Treating RN: 12/25/1925 (87 y.o. [name] Primary Care Provider: [name] Other Clinician: Referring Provider: Treating Provider/Extender: [name] in Treatment: 8 Constitutional respirations regular, non-labored and within target range for patient.. Cardiovascular 2+ dorsalis pedis/posterior tibialis pulses. Psychiatric pleasant and cooperative. Notes Bilateral lesions to the anterior shins with discoloration and irregular  borders, no drainage noted. No signs of infection. Electronic Signature(s) Signed: 08/09/2020 4:19:29 PM By: Kalman Shan DO Entered By: Kalman Shan on 08/09/2020 15:30:32 -------------------------------------------------------------------------------- Physician Orders Details Patient Name: Date of Service: [name], [place]. 08/09/2020 1:30 PM Medical Record Number: [id number] Patient Account Number:  [id number] Date of Birth/Sex: Treating RN: 1925-06-14 (87 y.o. [name], [name] Primary Care Provider: [name] Other Clinician: Referring Provider: Treating Provider/Extender: [name] in Treatment: 8 Verbal / Phone Orders: No Diagnosis Coding ICD-10 Coding Code Description C44.92 Squamous cell carcinoma of skin, unspecified L97.819 Non-pressure chronic ulcer of other part of right lower leg with unspecified severity L97.829 Non-pressure chronic ulcer of other part of left lower leg with unspecified severity I87.2 Venous insufficiency (chronic) (peripheral) Follow-up Appointments Return Appointment in 1 week. Bathing/ Shower/ Hygiene May shower with protection but do not get wound dressing(s) wet. - Use cast protector Edema Control - Lymphedema / SCD / Other Elevate legs to the level of the heart or above for 30 minutes daily and/or when sitting, a frequency of: - throughout the day Avoid standing for long periods of time. Exercise regularly Additional Orders / Instructions Follow Nutritious Diet Wound Treatment Wound #6 - Lower Leg Wound Laterality: Right, Anterior Cleanser: Soap and Water 1 x Per Day/15 Days Discharge Instructions: May shower and wash wound with dial antibacterial soap and water prior to dressing change. Cleanser: Wound Cleanser (DME) (Generic) 1 x Per Day/15 Days Discharge Instructions: Cleanse the wound with wound cleanser prior to applying a clean dressing using gauze sponges, not tissue or cotton  balls. Peri-Wound Care: Triamcinolone 15 (g) 1 x Per Day/15 Days Discharge Instructions: Use triamcinolone 15 (g) in clinic only. Peri-Wound Care: Sween Lotion (Moisturizing lotion) 1 x Per Day/15 Days Discharge Instructions: Apply moisturizing lotion as directed Prim Dressing: KerraCel Ag Gelling Fiber Dressing, 2x2 in (silver alginate) (DME) (Generic) 1 x Per Day/15 Days ary Discharge Instructions: Apply silver alginate to wound bed as instructed Secondary Dressing: Bordered Gauze, 4x4 in (DME) (Generic) 1 x Per Day/15 Days Discharge Instructions: Apply over primary dressing as directed. Wound #7 - Lower Leg Wound Laterality: Left, Anterior Cleanser: Soap and Water 1 x Per [id number] Days Discharge Instructions: May shower and wash wound with dial antibacterial soap and water prior to dressing change. Cleanser: Wound Cleanser (DME) (Generic) 1 x Per Day/15 Days Discharge Instructions: Cleanse the wound with wound cleanser prior to applying a clean dressing using gauze sponges, not tissue or cotton balls. Peri-Wound Care: Triamcinolone 15 (g) 1 x Per Day/15 Days Discharge Instructions: Use triamcinolone 15 (g) in clinic only. Peri-Wound Care: Sween Lotion (Moisturizing lotion) 1 x Per Day/15 Days Discharge Instructions: Apply moisturizing lotion as directed Prim Dressing: KerraCel Ag Gelling Fiber Dressing, 2x2 in (silver alginate) (DME) (Generic) 1 x Per Day/15 Days ary Discharge Instructions: Apply silver alginate to wound bed as instructed Secondary Dressing: Bordered Gauze, 4x4 in (DME) (Generic) 1 x Per Day/15 Days Discharge Instructions: Apply over primary dressing as directed. Electronic Signature(s) Signed: 08/09/2020 7:48:44 PM By: Rhae Hammock RN Signed: 08/13/2020 4:40:45 PM By: Kalman Shan DO Entered By: Rhae Hammock on 08/09/2020 18:56:15 -------------------------------------------------------------------------------- Problem List Details Patient Name: Date of  Service: [name], [place]. 08/09/2020 1:30 PM Medical Record Number: [id number] Patient Account Number: [id number] Date of Birth/Sex: Treating RN: 27-Sep-1925 (87 y.o. [name] Primary Care Provider: [name] Other Clinician: Referring Provider: Treating Provider/Extender: [name] in Treatment: 8 Active Problems ICD-10 Encounter Code Description Active Date MDM Diagnosis C44.92 Squamous cell carcinoma of skin, unspecified 06/11/2020 No Yes L97.819 Non-pressure chronic ulcer of other part of right lower leg with unspecified 06/11/2020 No Yes severity L97.829 Non-pressure chronic ulcer of other part of left lower leg with unspecified  06/11/2020 No Yes severity I87.2 Venous insufficiency (chronic) (peripheral) 06/11/2020 No Yes Inactive Problems Resolved Problems Electronic Signature(s) Signed: 08/09/2020 4:19:29 PM By: Kalman Shan DO Entered By: Kalman Shan on 08/09/2020 15:29:09 -------------------------------------------------------------------------------- Progress Note Details Patient Name: Date of Service: [name], [name] 08/09/2020 1:30 PM Medical Record Number: [id number] Patient Account Number: [id number] Date of Birth/Sex: Treating RN: 03-28-1925 (87 y.o. 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 She states that the wound on the right has been present for a year. The 1 on the left has opened up 1 month ago. She is followed with oncology for this issue as she had biopsies that showed squamous cell carcinoma. She is also seeing radiation oncology for treatment options. She presents today because she would like for her wounds to be healed by [place]. She currently denies signs of infection. 6/1; patient presents for 1 week follow-up. She states she has tolerated the leg wraps well. She states these do not bother her and is happy to continue with them. She is scheduled to see her oncologist today to go over treatment options for the bilateral lower extremity squamous cell carcinoma. Radiation is currently not a recommended option. Patient states she overall feels well. 6/22; patient presents for 3-week follow-up. She has tolerated the wraps well until her last wrap where she states they were uncomfortable. She attributes this to the home health nurse. She denies signs of infection. She has started her first treatment of antibody infusions for her Bilateral lower extremity squamous cell carcinoma. She has no complaints today. 7/21; patient presents for 1 month follow-up. Unfortunately she has not had good experience with her wrap changes with home health. She would like to do her own dressing changes. She continues to do her antibody infusions. She denies signs of infection. Patient History Information obtained from Patient. Family History Unknown History. Social History Never smoker, Marital Status - Single, Alcohol Use - Never, Drug Use - No History, Caffeine Use - Never. Medical History Eyes Denies history of Cataracts, Optic Neuritis Ear/Nose/Mouth/Throat Denies history of Chronic sinus problems/congestion, Middle ear problems Hematologic/Lymphatic Patient has history of Anemia Denies history  of Hemophilia, Human Immunodeficiency Virus, Lymphedema, Sickle Cell Disease Respiratory Denies history of Aspiration, Asthma, Chronic Obstructive Pulmonary Disease (COPD), Pneumothorax, Sleep Apnea, Tuberculosis Cardiovascular Patient has history of Arrhythmia - Atrial Flutter, A fibb, Congestive Heart Failure, Hypertension Denies history of Angina, Coronary Artery Disease, Deep Vein Thrombosis, Hypotension, Myocardial Infarction, Peripheral Arterial Disease, Peripheral Venous Disease, Phlebitis, Vasculitis Gastrointestinal Patient has history of Colitis Denies history of Cirrhosis , Crohnoos, Hepatitis A, Hepatitis B, Hepatitis C Endocrine Denies history of Type I Diabetes, Type II Diabetes Genitourinary Denies history of End Stage Renal Disease Immunological Denies history of Lupus Erythematosus, Raynaudoos, Scleroderma Integumentary (Skin) Denies history of History of Burn Musculoskeletal Patient has history of Osteoarthritis Denies history of Gout, Rheumatoid Arthritis, Osteomyelitis Neurologic Denies history of Dementia, Neuropathy, Quadriplegia,  Paraplegia, Seizure Disorder Oncologic Denies history of Received Chemotherapy, Received Radiation Hospitalization/Surgery History - removal of rod left leg. 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 Vitals Time Taken: 2:38 PM, Height: 65 in, Weight: 163 lbs, BMI: 27.1, Temperature: 98.2 F, Pulse: 76 bpm, Respiratory Rate: 20 breaths/min, Blood Pressure: 153/80 mmHg. Cardiovascular 2+ dorsalis pedis/posterior tibialis pulses. Psychiatric pleasant and cooperative. General Notes: Bilateral lesions to the anterior shins with discoloration and irregular borders, no drainage noted. No signs of  infection. Integumentary (Hair, Skin) Wound #6 status is Open. Original cause of wound was Blister. The date acquired was: 04/20/2020. The wound has been in treatment 8 weeks. The wound is located on the Right,Anterior Lower Leg. The wound measures 3.5cm length x 2cm width x 0.1cm depth; 5.498cm^2 area and 0.55cm^3 volume. There is Fat Layer (Subcutaneous Tissue) exposed. There is no tunneling or undermining noted. There is a medium amount of serosanguineous drainage noted. The wound margin is distinct with the outline attached to the wound base. There is large (67-100%) red, pink granulation within the wound bed. There is a small (1-33%) amount of necrotic tissue within the wound bed. Wound #7 status is Open. Original cause of wound was Blister. The date acquired was: 04/20/2020. The wound has been in treatment 8 weeks. The wound is located on the Left,Anterior Lower Leg. The wound measures 2cm length x 1cm width x 0.1cm depth; 1.571cm^2 area and 0.157cm^3 volume. There is Fat Layer (Subcutaneous Tissue) exposed. There is no tunneling or undermining noted. There is a medium amount of serosanguineous drainage noted. The wound margin is distinct with the outline attached to the wound base. There is large (67-100%) red, pink granulation within the wound bed. There is a small (1-33%) amount of necrotic tissue within the wound bed including Adherent Slough. Assessment Active Problems ICD-10 Squamous cell carcinoma of skin, unspecified Non-pressure chronic ulcer of other part of right lower leg with unspecified severity Non-pressure chronic ulcer of other part of left lower leg with unspecified severity Venous insufficiency (chronic) (peripheral) Patient presents for her 1 month follow-up. Her wounds actually look improved today. She no longer wants to use Kerlix/Coban wraps. I recommended using silver alginate with a border dressing. She can do this daily. I will see her her in 1 week to make sure she  does not want to switch back to wraps. Plan 1. Silver alginate under border dressing 2. Follow-up in 1 week Electronic Signature(s) Signed: 08/09/2020 4:19:29 PM By: Kalman Shan DO Entered By: Kalman Shan on 08/09/2020 15:32:47 -------------------------------------------------------------------------------- HxROS Details Patient Name: Date of Service: [name], [place]. 08/09/2020 1:30 PM Medical Record Number: [id number] Patient Account Number: [id number] Date of Birth/Sex: Treating RN: 1925-05-19 (87 y.o. [name] Primary Care Provider: [name] Other Clinician: Referring Provider: Treating Provider/Extender: [name] in Treatment: 8 Information Obtained From Patient Eyes Medical History: Negative for: Cataracts; Optic Neuritis Ear/Nose/Mouth/Throat Medical History: Negative for: Chronic sinus problems/congestion; Middle ear problems Hematologic/Lymphatic Medical History: Positive for: Anemia Negative for: Hemophilia; Human Immunodeficiency Virus; Lymphedema; Sickle Cell Disease Respiratory Medical History: Negative for: Aspiration; Asthma; Chronic Obstructive Pulmonary Disease (COPD); Pneumothorax; Sleep Apnea; Tuberculosis Cardiovascular Medical History: Positive for: Arrhythmia - Atrial Flutter, A fibb; Congestive Heart Failure; Hypertension Negative for: Angina; Coronary Artery Disease; Deep Vein Thrombosis; Hypotension; Myocardial Infarction; Peripheral Arterial Disease; Peripheral Venous Disease; Phlebitis; Vasculitis  Past Medical History Notes: hyperlipidemia Gastrointestinal Medical History: Positive for: Colitis Negative for: Cirrhosis ; Crohns; Hepatitis A; Hepatitis B; Hepatitis C Endocrine Medical History: Negative for: Type I Diabetes; Type II Diabetes Past Medical History Notes: hypothyroidism Genitourinary Medical History: Negative for: End Stage Renal Disease Immunological Medical  History: Negative for: Lupus Erythematosus; Raynauds; Scleroderma Integumentary (Skin) Medical History: Negative for: History of Burn Musculoskeletal Medical History: Positive for: Osteoarthritis Negative for: Gout; Rheumatoid Arthritis; Osteomyelitis Neurologic Medical History: Negative for: Dementia; Neuropathy; Quadriplegia; Paraplegia; Seizure Disorder Past Medical History Notes: lumbar spindylolysis Oncologic Medical History: Negative for: Received Chemotherapy; Received Radiation Past Medical History Notes: BLE squamous ceel carcionoma Immunizations Pneumococcal Vaccine: Received Pneumococcal Vaccination: Yes Implantable Devices None Hospitalization / Surgery History Type of Hospitalization/Surgery removal of rod left leg Family and Social History Unknown History: Yes; Never smoker; Marital Status - Single; Alcohol Use: Never; Drug Use: No History; Caffeine Use: Never; Financial Concerns: No; Food, Clothing or Shelter Needs: No; Support System Lacking: No; Transportation Concerns: No Electronic Signature(s) Signed: 08/09/2020 4:19:29 PM By: Kalman Shan DO Signed: 08/09/2020 6:31:47 PM By: Deon Pilling Entered By: Kalman Shan on 08/09/2020 15:30:13 -------------------------------------------------------------------------------- SuperBill Details Patient Name: Date of Service: [name], [name] 08/09/2020 Medical Record Number: [id number] Patient Account Number: [id number] Date of Birth/Sex: Treating RN: [birth year] (87 y.o. 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 lower leg with unspecified severity L97.829 Non-pressure chronic ulcer of other part of left  lower leg with unspecified severity I87.2 Venous insufficiency (chronic) (peripheral) Facility Procedures CPT4 Code: [id number] Description: [id number] - WOUND CARE VISIT-LEV 3 EST PT Modifier: Quantity: 1 Physician Procedures : CPT4 Code Description Modifier [id number] - WC PHYS LEVEL 3 - EST PT ICD-10 Diagnosis Description L97.819 Non-pressure 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

## 2020-08-15 ENCOUNTER — Other Ambulatory Visit: Payer: Self-pay

## 2020-08-15 ENCOUNTER — Telehealth: Payer: Self-pay | Admitting: *Deleted

## 2020-08-15 ENCOUNTER — Inpatient Hospital Stay (HOSPITAL_BASED_OUTPATIENT_CLINIC_OR_DEPARTMENT_OTHER): Payer: Medicare Other | Admitting: Oncology

## 2020-08-15 ENCOUNTER — Inpatient Hospital Stay: Payer: Medicare Other

## 2020-08-15 VITALS — BP 129/62 | HR 66 | Temp 97.5°F | Resp 18 | Wt 163.3 lb

## 2020-08-15 DIAGNOSIS — C44721 Squamous cell carcinoma of skin of unspecified lower limb, including hip: Secondary | ICD-10-CM | POA: Diagnosis not present

## 2020-08-15 DIAGNOSIS — C44722 Squamous cell carcinoma of skin of right lower limb, including hip: Secondary | ICD-10-CM | POA: Diagnosis not present

## 2020-08-15 DIAGNOSIS — Z79899 Other long term (current) drug therapy: Secondary | ICD-10-CM | POA: Diagnosis not present

## 2020-08-15 DIAGNOSIS — Z170 Estrogen receptor positive status [ER+]: Secondary | ICD-10-CM

## 2020-08-15 DIAGNOSIS — Z5112 Encounter for antineoplastic immunotherapy: Secondary | ICD-10-CM | POA: Diagnosis not present

## 2020-08-15 DIAGNOSIS — C50412 Malignant neoplasm of upper-outer quadrant of left female breast: Secondary | ICD-10-CM

## 2020-08-15 DIAGNOSIS — C4490 Unspecified malignant neoplasm of skin, unspecified: Secondary | ICD-10-CM

## 2020-08-15 DIAGNOSIS — C44729 Squamous cell carcinoma of skin of left lower limb, including hip: Secondary | ICD-10-CM | POA: Diagnosis not present

## 2020-08-15 DIAGNOSIS — C50912 Malignant neoplasm of unspecified site of left female breast: Secondary | ICD-10-CM

## 2020-08-15 DIAGNOSIS — E064 Drug-induced thyroiditis: Secondary | ICD-10-CM

## 2020-08-15 LAB — CBC WITH DIFFERENTIAL/PLATELET
Abs Immature Granulocytes: 0.01 10*3/uL (ref 0.00–0.07)
Basophils Absolute: 0 10*3/uL (ref 0.0–0.1)
Basophils Relative: 1 %
Eosinophils Absolute: 0.1 10*3/uL (ref 0.0–0.5)
Eosinophils Relative: 2 %
HCT: 34.2 % — ABNORMAL LOW (ref 36.0–46.0)
Hemoglobin: 11.2 g/dL — ABNORMAL LOW (ref 12.0–15.0)
Immature Granulocytes: 0 %
Lymphocytes Relative: 24 %
Lymphs Abs: 1.4 10*3/uL (ref 0.7–4.0)
MCH: 34.5 pg — ABNORMAL HIGH (ref 26.0–34.0)
MCHC: 32.7 g/dL (ref 30.0–36.0)
MCV: 105.2 fL — ABNORMAL HIGH (ref 80.0–100.0)
Monocytes Absolute: 0.8 10*3/uL (ref 0.1–1.0)
Monocytes Relative: 14 %
Neutro Abs: 3.3 10*3/uL (ref 1.7–7.7)
Neutrophils Relative %: 59 %
Platelets: 224 10*3/uL (ref 150–400)
RBC: 3.25 MIL/uL — ABNORMAL LOW (ref 3.87–5.11)
RDW: 13.9 % (ref 11.5–15.5)
WBC: 5.5 10*3/uL (ref 4.0–10.5)
nRBC: 0 % (ref 0.0–0.2)

## 2020-08-15 LAB — COMPREHENSIVE METABOLIC PANEL
ALT: 11 U/L (ref 0–44)
AST: 19 U/L (ref 15–41)
Albumin: 3.4 g/dL — ABNORMAL LOW (ref 3.5–5.0)
Alkaline Phosphatase: 60 U/L (ref 38–126)
Anion gap: 10 (ref 5–15)
BUN: 15 mg/dL (ref 8–23)
CO2: 26 mmol/L (ref 22–32)
Calcium: 9.7 mg/dL (ref 8.9–10.3)
Chloride: 100 mmol/L (ref 98–111)
Creatinine, Ser: 1.08 mg/dL — ABNORMAL HIGH (ref 0.44–1.00)
GFR, Estimated: 48 mL/min — ABNORMAL LOW (ref >60)
Glucose, Bld: 139 mg/dL — ABNORMAL HIGH (ref 70–99)
Potassium: 3.9 mmol/L (ref 3.5–5.1)
Sodium: 136 mmol/L (ref 135–145)
Total Bilirubin: 1.7 mg/dL — ABNORMAL HIGH (ref 0.3–1.2)
Total Protein: 7.4 g/dL (ref 6.5–8.1)

## 2020-08-15 LAB — TSH: TSH: 0.786 u[IU]/mL (ref 0.308–3.960)

## 2020-08-15 MED ORDER — SODIUM CHLORIDE 0.9 % IV SOLN
Freq: Once | INTRAVENOUS | Status: AC
  Filled 2020-08-15: qty 250

## 2020-08-15 MED ORDER — SODIUM CHLORIDE 0.9 % IV SOLN
350.0000 mg | Freq: Once | INTRAVENOUS | Status: AC
  Administered 2020-08-15: 350 mg via INTRAVENOUS
  Filled 2020-08-15: qty 7

## 2020-08-15 NOTE — Telephone Encounter (Signed)
Per MD review of T bili of 1.7 - order given to proceed with treatment.

## 2020-08-15 NOTE — Progress Notes (Signed)
[place]  Telephone:(336) [phone] Fax:(336) [phone]     ID: [name] DOB: Oct 21, 1925  MR#: [id number]  IRC#:[id number]  Patient Care Team: [name] Anger, MD as PCP - General [name] Form Annita Brod, MD as PCP - Cardiology (Cardiology) [name] Bookbinder, MD as Consulting Physician (General Surgery) [name], [name] Dad, MD as Consulting Physician (Oncology) [name] Blanks, MD as Consulting Physician (Cardiology) [name] Matin, MD as Consulting Physician (Dermatology) [name] Sa Tonna Corner, MD as Consulting Physician (Orthopedic Surgery) [name] Sprang, MD as Consulting Physician (Neurology) [name] Gibson, MD as Attending Physician (Radiation Oncology) OTHER MD:   CHIEF COMPLAINT: Estrogen receptor positive invasive breast cancer  CURRENT TREATMENT: cemiplimab/ Libtayo   INTERVAL HISTORY: [name] returns today for follow-up of her new skin cancer.  She is accompanied by her daughter from [place], [place].  The patient's granddaughter [name] according to the patient has recurrent breast cancer involving bone and lymph nodes and is being treated actively so was not able to bring her.  [name] began systemic therapy with cemiplimab on 07/04/2020. She had significant diarrhea from the treatment and we started her on Imodium and Questran.  She was not able to obtain the Questran for insurance reasons so that was changed to colestipol.  At present she is using neither drug and she tells me she has no diarrhea problems.  In fact she has been slightly constipated for the last several days.  The last mammogram we have records for is from July 2019 at [facility] showing a density category A.  [name] tells me there has been too much on her plate and she has neglected her health to some extent.   REVIEW OF SYSTEMS: [name] continues to help care for her husband.  She is very busy all day she says.  By the time she gets to bed at night she is ready to sleep and she sleeps most of the  night.  She has no symptoms related to her treatment here.  She continues to go to the wound center regularly and we reviewed photos today.  She is also being told by the staff there that her squamous cells do appear to be improving under the current treatment she is receiving here   [name]: fully vaccinated [employer]), with booster 10/2019   BREAST CANCER HISTORY: From the original intake note:  [name] noted a change in her left breast and had bilateral screening mammography at [facility] 06/07/2015, with tomosynthesis. Breast density was category A. There was a new oval mass in the left breast central to the nipple. Left breast ultrasound the same day confirmed a 3.4 cm oval mass in the left breast upper outer quadrant, correlating with the mammography. The left axilla was sonographically benign.  Biopsy of the left breast mass in question 06/11/2015 showed ([id number]) a papillary lesion, consistent with an intraductal papilloma, but due to fragmentation the edges of the lesion could not be assessed.  Accordingly the patient was referred to surgery, and after appropriate discussion underwent left lumpectomy 07/25/2015. The final pathology ([id number]) showed 2 foci of invasive ductal carcinoma, grade 2, measuring 3.0 cm, and grade 1, measuring 0.9 cm. There was also ductal carcinoma in situ. The invasive tumor was present at the superior margin, and ductal carcinoma in situ was focally less than 0.1 cm to the same margin. There was evidence of lymphovascular invasion. The prognostic panel showed estrogen receptor 100% positive, progesterone receptor 100% positive, both with strong staining intensity, for both lesions.  HER-2 was also negative for both lesions, the signals ratio being 1.45-1.65, and the number per cell 2.10-2.80.  The patient's subsequent history is as detailed below   PAST MEDICAL HISTORY: Past Medical History:  Diagnosis Date   Anxiety    denies   Atrial  flutter ([place])    Bilateral edema of lower extremity    Breast cancer ([place])    left breast   Chronic constipation    Chronic diastolic CHF (congestive heart failure) (HCC)    Diverticulosis of colon    GERD (gastroesophageal reflux disease)    H/O hiatal hernia    History of cellulitis    LEFT LOWER LEG --  SEPT 2015   History of colitis    DX  2003  WITH ISCHEMIC COLITIS   History of diverticulitis of colon    perferated diverticulitis w/ abscess 08-25-2013 drain placed --  resolved without surgical intervention   History of GI bleed    2011--- UPPER SECONARY GASTRIC ULCER FROM NSAID USE   History of Helicobacter pylori infection    2011   History of ulcer of lower limb    2012   RIGHT LOWER EXTREMITIY--  STATSIS ULCER   Hyperlipidemia    Hypertension    Hypothyroidism    Iron deficiency anemia 2011   elevated MCV   LBP (low back pain)    severe lumbar spondylosis s/p L3/L4,L4/L5 fusion 12/10   Lumbar spondylolysis    OA (osteoarthritis)    "hands" (08/25/2013)   Open wound of left lower leg    Osteopenia    Persistent atrial fibrillation ([place])    CARDIOLOGIST-   DR MCALANY   RBBB    Urinary frequency     PAST SURGICAL HISTORY: Past Surgical History:  Procedure Laterality Date   APPLICATION OF A-CELL OF EXTREMITY Left 06/21/2013   Procedure: APPLICATION OF A-CELL OF EXTREMITY;  Surgeon: Theodoro Kos, DO;  Location: [place];  Service: Plastics;  Laterality: Left;   APPLICATION OF A-CELL OF EXTREMITY Left 08/22/2013   Procedure: APPLICATION OF A-CELL/VAC TO LOWER LEFT LEG WOUND;  Surgeon: Theodoro Kos, DO;  Location: [place];  Service: Plastics;  Laterality: Left;   APPLICATION OF A-CELL OF EXTREMITY Left 02/06/2014   Procedure: WITH PLACEMENT OF A -CELL ;  Surgeon: Theodoro Kos, DO;  Location: [place];  Service: Plastics;  Laterality: Left;   APPLICATION OF A-CELL OF EXTREMITY Left 08/09/2014   Procedure: PLACEMENT OF A CELL;  Surgeon:  Theodoro Kos, DO;  Location: [place];  Service: Plastics;  Laterality: Left;   APPLICATION OF WOUND VAC Left 06/16/2013   Procedure: APPLICATION OF WOUND VAC;  Surgeon: Meredith Pel, MD;  Location: [place];  Service: Orthopedics;  Laterality: Left;   APPLICATION OF WOUND VAC Left 06/21/2013   Procedure: APPLICATION OF WOUND VAC;  Surgeon: Theodoro Kos, DO;  Location: [place];  Service: Plastics;  Laterality: Left;   BIOPSY BREAST  07/25/2015   LEFT RADIOACTIVE SEED GUIDED EXCISIONAL BREAST BIOPSY (Left) as a surgical intervention   EXTERNAL FIXATION LEG Left 06/16/2013   Procedure: EXTERNAL FIXATION LEG;  Surgeon: Meredith Pel, MD;  Location: [place];  Service: Orthopedics;  Laterality: Left;   EXTERNAL FIXATION REMOVAL Left 06/21/2013   Procedure: REMOVAL EXTERNAL FIXATION LEG;  Surgeon: Meredith Pel, MD;  Location: [place];  Service: Orthopedics;  Laterality: Left;   EYE SURGERY Bilateral    cataract removal   HARDWARE REMOVAL Left 05/23/2014  Procedure: HARDWARE REMOVAL;  Surgeon: Meredith Pel, MD;  Location: [place];  Service: Orthopedics;  Laterality: Left;  REMOVAL OF HARDWARE LEFT TIBIA   I & D EXTREMITY Left 06/16/2013   Procedure: IRRIGATION AND DEBRIDEMENT EXTREMITY;  Surgeon: Meredith Pel, MD;  Location: [place];  Service: Orthopedics;  Laterality: Left;   I & D EXTREMITY Left 02/06/2014   Procedure: IRRIGATION AND DEBRIDEMENT LEFT LEG WOUND ;  Surgeon: Theodoro Kos, DO;  Location: [place];  Service: Plastics;  Laterality: Left;   I & D EXTREMITY Left 08/09/2014   Procedure: IRRIGATION AND DEBRIDEMENT  OF LEFT LOWER LEG;  Surgeon: Theodoro Kos, DO;  Location: [place];  Service: Plastics;  Laterality: Left;   LUMBAR FUSION  01-02-2009   L3-L5   RADIOACTIVE SEED GUIDED EXCISIONAL BREAST BIOPSY Left 07/25/2015   Procedure: LEFT RADIOACTIVE SEED GUIDED EXCISIONAL BREAST BIOPSY;  Surgeon: [name] Bookbinder, MD;  Location: [place];  Service: General;  Laterality:  Left;   RE-EXCISION OF BREAST LUMPECTOMY Left 09/05/2015   Procedure: RE-EXCISION OF LEFT BREAST LUMPECTOMY;  Surgeon: [name] Bookbinder, MD;  Location: [place];  Service: General;  Laterality: Left;   SHOULDER OPEN ROTATOR CUFF REPAIR Left 1997   TIBIA IM NAIL INSERTION Left 06/21/2013   Procedure: INTRAMEDULLARY (IM) NAIL TIBIAL;  Surgeon: Meredith Pel, MD;  Location: [place];  Service: Orthopedics;  Laterality: Left;   TONSILLECTOMY  AS CHILD   TOTAL HIP ARTHROPLASTY Right 06-07-2010   TRANSTHORACIC ECHOCARDIOGRAM  09-11-2010   DR MCALHANY   LVSF  55-60%/  MILD MV CALCIFICATION WITH NO STENOSIS/  MILD MR & TR / MODERATE LAE/  MODERATE TO SEVERE RAE   UPPER GI ENDOSCOPY  03-29-2010    FAMILY HISTORY Family History  Problem Relation Age of Onset   Cancer Father    Hypertension Other   The patient's father died at the age of 32 from heart problems. The patient's mother died at the age of 52 with a ruptured aneurysm. The patient had no brothers, 1 sister. There is no history of breast or ovarian cancer in the family.    GYNECOLOGIC HISTORY:  No LMP recorded. Patient is postmenopausal. Menarche age 30, first live birth age 89. The patient is GX P2. She stopped having periods in her early 31s. She did not take hormone replacement. She used oral contraceptives remotely for approximately 2 years, with no complications.   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 Her husband [name] was a Airline pilot.  According to the patient there are significant cognitive limitations on his side.  They currently live at [facility]. Daughter [name] lives in [place], near [place]. With her husband [name]. Daughter [name] lives in [place]. The patient has 2 grandchildren and 4 great-grandchildren. She is a Psychologist, forensic.   ADVANCED DIRECTIVES: The patient has a living will in place and she has named her granddaughter [name] as her healthcare part of attorney. [name]  can be reached at [place]: Social History   Tobacco Use   Smoking status: Never   Smokeless tobacco: Never  Vaping Use   Vaping Use: Never used  Substance Use Topics   Alcohol use: No    Comment: 1 glass occ wine 08/03/14- none in a year   Drug use: No     Colonoscopy: Laurence Spates, remote  PAP:  Bone density:   Allergies  Allergen Reactions   Zosyn [Piperacillin  Sod-Tazobactam So] Hives, Itching, Rash and Other (See Comments)    Morbilliform eruptions with itching- looks like Measles   Atorvastatin     myalgias   Atenolol Nausea And Vomiting   Clarithromycin Nausea And Vomiting   Codeine Sulfate Nausea Only   Levaquin [Levofloxacin] Nausea Only   Macrodantin Other (See Comments)    UNSPECIFIED    Oxycodone-Acetaminophen Nausea And Vomiting    Current Outpatient Medications  Medication Sig Dispense Refill   acetaminophen (TYLENOL) 500 MG tablet Take 500 mg by mouth daily as needed for mild pain.      apixaban (ELIQUIS) 5 MG TABS tablet Take 1 tablet (5 mg total) by mouth 2 (two) times daily. 180 tablet 3   B Complex-C (B-COMPLEX WITH VITAMIN C) tablet Take 1 tablet by mouth daily.     busPIRone (BUSPAR) 15 MG tablet Take 1 tablet (15 mg total) by mouth 2 (two) times daily. 180 tablet 11   Cholecalciferol (VITAMIN D3) 1000 UNITS tablet Take 1,000 Units by mouth daily.     colestipol (COLESTID) 1 g tablet Take 1 tablet (1 g total) by mouth in the morning. 60 tablet 12   diltiazem (CARDIZEM CD) 120 MG 24 hr capsule TAKE 1 CAPSULE (120 MG TOTAL) BY MOUTH DAILY. 90 capsule 3   doxycycline (VIBRA-TABS) 100 MG tablet Take 1 tablet (100 mg total) by mouth daily. 90 tablet 3   famotidine (PEPCID) 20 MG tablet Take 20 mg by mouth daily.     gabapentin (NEURONTIN) 100 MG capsule Take 1 capsule (100 mg total) by mouth at bedtime. Pain 90 capsule 3   levothyroxine (SYNTHROID) 25 MCG tablet TAKE 1 TABLET BY MOUTH DAILY FOR THYROID. 30 tablet 11    loperamide (IMODIUM) 2 MG capsule Take 1 capsule (2 mg total) by mouth as needed for diarrhea or loose stools. Take 1 tab with first loose stool then one tab after each loose stool for maximum dose of 8 tabs daily. Pt to call the office if need for greater then 8 tabs a day 60 capsule 0   metoprolol succinate (TOPROL-XL) 50 MG 24 hr tablet Take 0.5 tablets (25 mg total) by mouth 2 (two) times daily. Take with or immediately following a meal. 180 tablet 3   Multiple Vitamins-Minerals (MULTIVITAMIN WITH MINERALS) tablet Take 1 tablet by mouth daily.     polyethylene glycol (MIRALAX) 17 g packet Take 17 g by mouth daily. 30 each 0   prochlorperazine (COMPAZINE) 5 MG tablet Take 1 tablet (5 mg total) by mouth every 8 (eight) hours as needed for nausea or vomiting. 20 tablet 1   temazepam (RESTORIL) 15 MG capsule TAKE 1 CAPSULE BY MOUTH EVERY NIGHT AT BEDTIME AS NEEDED FOR SLEEP 60 capsule 3   No current facility-administered medications for this visit.    OBJECTIVE: White woman who appears stated age  87:   08/15/20 1351  BP: 129/62  Pulse: 66  Resp: 18  Temp: (!) 97.5 F (36.4 C)  SpO2: 99%      Body mass index is 27.17 kg/m.     ECOG FS:1 - Symptomatic but completely ambulatory  Sclerae unicteric, EOMs intact Wearing a mask No cervical or supraclavicular adenopathy Lungs no rales or rhonchi Heart regular rate and rhythm Abd soft, nontender, positive bowel sounds MSK no focal spinal tenderness, using a Rollator Neuro: nonfocal, well oriented, appropriate affect Breasts: Deferred   08/09/20 wound care center appointment:       06/20/2020 [place] appointment  LAB RESULTS:  CMP     Component Value Date/Time   NA 136 08/15/2020 1321   NA 134 08/11/2018 1425   NA 137 07/28/2016 1334   K 3.9 08/15/2020 1321   K 3.8 07/28/2016 1334   CL 100 08/15/2020 1321   CO2 26 08/15/2020 1321   CO2 28 07/28/2016 1334   GLUCOSE 139 (H) 08/15/2020 1321   GLUCOSE  160 (H) 07/28/2016 1334   BUN 15 08/15/2020 1321   BUN 20 08/11/2018 1425   BUN 9.5 07/28/2016 1334   CREATININE 1.08 (H) 08/15/2020 1321   CREATININE 1.0 07/28/2016 1334   CALCIUM 9.7 08/15/2020 1321   CALCIUM 9.6 07/28/2016 1334   PROT 7.4 08/15/2020 1321   PROT 7.4 07/28/2016 1334   ALBUMIN 3.4 (L) 08/15/2020 1321   ALBUMIN 4.0 07/28/2016 1334   AST 19 08/15/2020 1321   AST 21 07/28/2016 1334   ALT 11 08/15/2020 1321   ALT 24 07/28/2016 1334   ALKPHOS 60 08/15/2020 1321   ALKPHOS 96 07/28/2016 1334   BILITOT 1.7 (H) 08/15/2020 1321   BILITOT 1.21 (H) 07/28/2016 1334   GFRNONAA 48 (L) 08/15/2020 1321   GFRAA 50 (L) 10/03/2019 1355    INo results found for: SPEP, UPEP  Lab Results  Component Value Date   WBC 5.5 08/15/2020   NEUTROABS 3.3 08/15/2020   HGB 11.2 (L) 08/15/2020   HCT 34.2 (L) 08/15/2020   MCV 105.2 (H) 08/15/2020   PLT 224 08/15/2020      Chemistry      Component Value Date/Time   NA 136 08/15/2020 1321   NA 134 08/11/2018 1425   NA 137 07/28/2016 1334   K 3.9 08/15/2020 1321   K 3.8 07/28/2016 1334   CL 100 08/15/2020 1321   CO2 26 08/15/2020 1321   CO2 28 07/28/2016 1334   BUN 15 08/15/2020 1321   BUN 20 08/11/2018 1425   BUN 9.5 07/28/2016 1334   CREATININE 1.08 (H) 08/15/2020 1321   CREATININE 1.0 07/28/2016 1334      Component Value Date/Time   CALCIUM 9.7 08/15/2020 1321   CALCIUM 9.6 07/28/2016 1334   ALKPHOS 60 08/15/2020 1321   ALKPHOS 96 07/28/2016 1334   AST 19 08/15/2020 1321   AST 21 07/28/2016 1334   ALT 11 08/15/2020 1321   ALT 24 07/28/2016 1334   BILITOT 1.7 (H) 08/15/2020 1321   BILITOT 1.21 (H) 07/28/2016 1334       No results found for: LABCA2  No components found for: LABCA125  No results for input(s): INR in the last 168 hours.  Urinalysis    Component Value Date/Time   COLORURINE YELLOW 10/07/2018 1535   APPEARANCEUR CLEAR 10/07/2018 1535   LABSPEC 1.012 10/07/2018 1535   PHURINE 6.0 10/07/2018 1535    GLUCOSEU NEGATIVE 10/07/2018 1535   GLUCOSEU NEGATIVE 01/17/2015 1415   HGBUR NEGATIVE 10/07/2018 [place] 10/07/2018 1535   KETONESUR NEGATIVE 10/07/2018 1535   PROTEINUR 30 (A) 10/07/2018 1535   UROBILINOGEN 0.2 01/17/2015 1415   NITRITE NEGATIVE 10/07/2018 1535   LEUKOCYTESUR NEGATIVE 10/07/2018 1535    STUDIES: No results found.   ELIGIBLE FOR AVAILABLE RESEARCH PROTOCOL: no  ASSESSMENT: 87 y.o. [place] woman status post left breast upper outer quadrant biopsy 06/11/2015 for a papillary lesion with unclear margins.  (1) status post left lumpectomy 07/25/2015 for an mpT2 cN0, stage IIA  invasive ductal carcinoma,  grade I-II  estrogen and progesterone receptor strongly positive, HER-2 not  amplified   (a) additional surgery 09/05/2015 found some residual ductal carcinoma in situ but the margins were clear.  (2) no sentinel lymph node sampling or adjuvant radiation planned  (3) adjuvant anastrozole started 08/10/2015, completed May 2022  (4) squamous cell carcinoma of the skin in both lower extremities  (a) started cemiplimab/ Libtayo 07/04/2020, repeated every 21 days x 8  (b) consider cetuximab if Libtayo ineffective or not tolerated   PLAN:  Disaya is currently tolerated the cemiplimab with no side effects that she is aware of.  This is very encouraging.  We reviewed the most recent photos (taken today) of her squamous cell carcinomas and they do appear to be shrinking.  There is certainly no evidence of progression.  Accordingly at this point the plan is to continue the cemiplimab every 3 weeks indefinitely or until there is complete healing.  She will receive her treatment in 3 weeks and I will see her with the next treatment 6 weeks from now.  After that we may broaden the follow-up interval even further as tolerated.  She knows to call for any other issue admittable before the next visit  Total encounter time 25 minutes.*  [name], [name] Dad,  MD  08/15/20 2:23 PM Medical Oncology and Hematology [facility], [place] [address] Tel. [phone]    Fax. [phone]   I, Wilburn Mylar, am acting as scribe for Dr. Virgie Bell. [name].  I, Lurline Del MD, have reviewed the above documentation for accuracy and completeness, and I agree with the above.   *Total Encounter Time as defined by the Centers for Medicare and Medicaid Services includes, in addition to the face-to-face time of a patient visit (documented in the note above) non-face-to-face time: obtaining and reviewing outside history, ordering and reviewing medications, tests or procedures, care coordination (communications with other health care professionals or caregivers) and documentation in the medical record.

## 2020-08-15 NOTE — Patient Instructions (Signed)
[facility] MEDICAL [facility]COLOG[facility]  Discharge Instructions: Thank you for choosing [facility] to provide your oncology and hematology care.   If you have a lab appointment with the Cancer Center, please go directly to the Cancer Center and check in at the registration area.   Wear comfortable clothing and clothing appropriate for easy access to any Portacath or PICC line.   We strive to give you quality time with your provider. You may need to reschedule your appointment if you arrive late (15 or more minutes).  Arriving late affects you and other patients whose appointments are after yours.  Also, if you miss three or more appointments without notifying the office, you may be dismissed from the clinic at the provider's discretion.      For prescription refill requests, have your pharmacy contact our office and allow 72 hours for refills to be completed.    Today you received the following chemotherapy and/or immunotherapy agents libtayo   To help prevent nausea and vomiting after your treatment, we encourage you to take your nausea medication as directed.  BELOW ARE SYMPTOMS THAT SHOULD BE REPORTED IMMEDIATELY: . *FEVER GREATER THAN 100.4 F (38 C) OR HIGHER . *CHILLS OR SWEATING . *NAUSEA AND VOMITING THAT IS NOT CONTROLLED WITH YOUR NAUSEA MEDICATION . *UNUSUAL SHORTNESS OF BREATH . *UNUSUAL BRUISING OR BLEEDING . *URINARY PROBLEMS (pain or burning when urinating, or frequent urination) . *BOWEL PROBLEMS (unusual diarrhea, constipation, pain near the anus) . TENDERNESS IN MOUTH AND THROAT WITH OR WITHOUT PRESENCE OF ULCERS (sore throat, sores in mouth, or a toothache) . UNUSUAL RASH, SWELLING OR PAIN  . UNUSUAL VAGINAL DISCHARGE OR ITCHING   Items with * indicate a potential emergency and should be followed up as soon as possible or go to the Emergency Department if any problems should occur.  Please show the CHEMOTHERAPY ALERT CARD or IMMUNOTHERAPY ALERT CARD  at check-in to the Emergency Department and triage nurse.  Should you have questions after your visit or need to cancel or reschedule your appointment, please contact [facility]  Dept: [phone]  and follow the prompts.  Office hours are 8:00 a.m. to 4:30 p.m. Monday - Friday. Please note that voicemails left after 4:00 p.m. may not be returned until the following business day.  We are closed weekends and major holidays. You have access to a nurse at all times for urgent questions. Please call the main number to the clinic Dept: [phone] and follow the prompts.   For any non-urgent questions, you may also contact your provider using MyChart. We now offer e-Visits for anyone 18 and older to request care online for non-urgent symptoms. For details visit [email or web address].   Also download the MyChart app! Go to the app store, search "MyChart", open the app, select [facility], and log in with your MyChart username and password.  Due to Covid, a mask is required upon entering the hospital/clinic. If you do not have a mask, one will be given to you upon arrival. For doctor visits, patients may have 1 support person aged 18 or older with them. For treatment visits, patients cannot have anyone with them due to current Covid guidelines and our immunocompromised population.   

## 2020-08-16 ENCOUNTER — Encounter (HOSPITAL_BASED_OUTPATIENT_CLINIC_OR_DEPARTMENT_OTHER): Payer: Medicare Other | Admitting: Internal Medicine

## 2020-08-16 DIAGNOSIS — L97829 Non-pressure chronic ulcer of other part of left lower leg with unspecified severity: Secondary | ICD-10-CM | POA: Diagnosis not present

## 2020-08-16 DIAGNOSIS — I110 Hypertensive heart disease with heart failure: Secondary | ICD-10-CM | POA: Diagnosis not present

## 2020-08-16 DIAGNOSIS — I872 Venous insufficiency (chronic) (peripheral): Secondary | ICD-10-CM | POA: Diagnosis not present

## 2020-08-16 DIAGNOSIS — L97819 Non-pressure chronic ulcer of other part of right lower leg with unspecified severity: Secondary | ICD-10-CM

## 2020-08-16 DIAGNOSIS — E039 Hypothyroidism, unspecified: Secondary | ICD-10-CM | POA: Diagnosis not present

## 2020-08-16 DIAGNOSIS — C4492 Squamous cell carcinoma of skin, unspecified: Secondary | ICD-10-CM

## 2020-08-16 DIAGNOSIS — I509 Heart failure, unspecified: Secondary | ICD-10-CM | POA: Diagnosis not present

## 2020-08-16 NOTE — Progress Notes (Signed)
[name] ([id number]) , Visit Report for 08/16/2020 Chief Complaint Document Details Patient Name: Date of Service: [name], [place] 08/16/2020 10:30 A M Medical Record Number: [id number] Patient Account Number: [id number] Date of Birth/Sex: Treating RN: 09/20/1925 (87 y.o. [name] Primary Care Provider: [name] Other Clinician: Referring Provider: Treating Provider/Extender: [name] in Treatment: 9 Information Obtained from: Patient Chief Complaint Bilateral lower extremity wounds that have been biopsied and positive for squamous cell carcinoma Electronic Signature(s) Signed: 08/16/2020 12:00:52 PM By: Kalman Shan DO Entered By: Kalman Shan on 08/16/2020 11:48:49 -------------------------------------------------------------------------------- HPI Details Patient Name: Date of Service: [name], [name] 08/16/2020 10:30 A M Medical Record Number: [id number] Patient Account Number: [id number] Date of Birth/Sex: Treating RN: [birth year] (87 y.o. 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 She states that the wound on the right has been present for a year. The 1 on the left has opened up 1 month ago. She is followed with oncology for this issue as she had biopsies that showed squamous cell carcinoma. She is also seeing radiation oncology for treatment options. She presents today because she would like for her wounds to  be healed by [place]. She currently denies signs of infection. 6/1; patient presents for 1 week follow-up. She states she has tolerated the leg wraps well. She states these do not bother her and is happy to continue with them. She is scheduled to see her oncologist today to go over treatment options for the bilateral lower extremity squamous cell carcinoma. Radiation is currently not a recommended option. Patient states she overall feels well. 6/22; patient presents for 3-week follow-up. She has tolerated the wraps well until her last wrap where she states they were uncomfortable. She attributes this to the home health nurse. She denies signs of infection. She has started her first treatment of antibody infusions for her Bilateral lower extremity squamous cell carcinoma. She has no complaints today. 7/21; patient presents for 1 month follow-up. Unfortunately she has not had good experience with her wrap changes with home health. She would like to do her own dressing changes. She continues to do her antibody infusions. She denies signs of infection. 7/28; patient presents for 1 week follow-up. At last clinic visit she was switched to daily dressing changes due to issues with the wrap and home health placing them. Unfortunately she has developed weeping to her legs bilaterally. She would like to be placed in wraps today. She would also like to follow with [place] weekly for wrap changes instead of having home health change them. She denies signs of infection. Electronic Signature(s) Signed: 08/16/2020 12:00:52 PM By: Kalman Shan DO Entered By: Kalman Shan on 08/16/2020 11:50:43 -------------------------------------------------------------------------------- Physical Exam Details Patient Name: Date of Service: [name], [place] 08/16/2020 10:30 A M Medical Record Number: [id number] Patient Account Number: [id number] Date of Birth/Sex: Treating RN: 24-Oct-1925 (87 y.o. [name] Primary Care  Provider: [name] Other Clinician: Referring Provider: Treating Provider/Extender: [name] in Treatment: 9 Constitutional respirations regular, non-labored and  within target range for patient.. Cardiovascular 2+ dorsalis pedis/posterior tibialis pulses. Psychiatric pleasant and cooperative. Notes Bilateral lesions to the anterior shins with discoloration and irregular borders. Weeping to her legs bilaterally with excoriation noted. No increased warmth, tenderness or purulent drainage noted. Electronic Signature(s) Signed: 08/16/2020 12:00:52 PM By: Kalman Shan DO Entered By: Kalman Shan on 08/16/2020 11:51:53 -------------------------------------------------------------------------------- Physician Orders Details Patient Name: Date of Service: [name], [place]. 08/16/2020 10:30 A M Medical Record Number: [id number] Patient Account Number: [id number] Date of Birth/Sex: Treating RN: 12-20-1925 (87 y.o. [name], [name] Primary Care Provider: [name] Other Clinician: Referring Provider: Treating Provider/Extender: [name] in Treatment: 9 Verbal / Phone Orders: No Diagnosis Coding ICD-10 Coding Code Description C44.92 Squamous cell carcinoma of skin, unspecified L97.819 Non-pressure chronic ulcer of other part of right lower leg with unspecified severity L97.829 Non-pressure chronic ulcer of other part of left lower leg with unspecified severity I87.2 Venous insufficiency (chronic) (peripheral) Follow-up Appointments ppointment in 1 week. - Dr. Heber [place] Return A Bathing/ Shower/ Hygiene May shower with protection but do not get wound dressing(s) wet. - Use cast protector Edema Control - Lymphedema / SCD / Other Elevate legs to the level of the heart or above for 30 minutes daily and/or when sitting, a frequency of: - elevate the legs throughout the day heart level if  possible. Avoid standing for long periods of time. Exercise regularly Compression stocking or Garment 30-40 mm/Hg pressure to: - wound center to order juxtalite HD compression garments. Bring in next appt time when you see Dr. Heber . Additional Orders / Instructions Follow [place] home health for wound care. - wound center to ensure [facility] has been discharged. Wound Treatment Wound #6 - Lower Leg Wound Laterality: Right, Anterior Cleanser: Soap and Water 1 x Per Week/15 Days Discharge Instructions: May shower and wash wound with dial antibacterial soap and water prior to dressing change. Cleanser: Wound Cleanser 1 x Per Week/15 Days Discharge Instructions: Cleanse the wound with wound cleanser prior to applying a clean dressing using gauze sponges, not tissue or cotton balls. Peri-Wound Care: Triamcinolone 15 (g) 1 x Per Week/15 Days Discharge Instructions: In clinic only. Use triamcinolone 15 (g) in clinic only. Peri-Wound Care: Sween Lotion (Moisturizing lotion) 1 x Per Week/15 Days Discharge Instructions: Apply moisturizing lotion as directed Prim Dressing: KerraCel Ag Gelling Fiber Dressing, 2x2 in (silver alginate) 1 x Per Week/15 Days ary Discharge Instructions: Apply silver alginate to wound bed as instructed Secondary Dressing: Woven Gauze Sponge, Non-Sterile 4x4 in 1 x Per Week/15 Days Discharge Instructions: Apply over primary dressing as directed. Secondary Dressing: ABD Pad, 8x10 1 x Per Week/15 Days Discharge Instructions: Apply over primary dressing as directed. Compression Wrap: Kerlix Roll 4.5x3.1 (in/yd) 1 x Per Week/15 Days Discharge Instructions: Apply Kerlix and Coban compression as directed. Compression Wrap: Coban Self-Adherent Wrap 4x5 (in/yd) 1 x Per Week/15 Days Discharge Instructions: Apply over Kerlix as directed. Compression Stockings: Circaid Juxta Lite Compression Wrap (DME) Left Leg Compression Amount: 30-40  mmHG Right Leg Compression Amount: 30-40 mmHG Discharge Instructions: Apply Circaid Juxta Lite Compression Wrap daily as instructed. Apply first thing in the morning, remove at night before bed. Wound #7 - Lower Leg Wound Laterality: Left, Anterior Cleanser: Soap and Water 1 x Per Week/15 Days Discharge Instructions: May shower and wash wound with dial antibacterial soap and water prior to dressing change. Cleanser: Wound Cleanser 1 x Per Week/15 Days Discharge Instructions: Cleanse  the wound with wound cleanser prior to applying a clean dressing using gauze sponges, not tissue or cotton balls. Peri-Wound Care: Triamcinolone 15 (g) 1 x Per Week/15 Days Discharge Instructions: In clinic only. Use triamcinolone 15 (g) in clinic only. Peri-Wound Care: Sween Lotion (Moisturizing lotion) 1 x Per Week/15 Days Discharge Instructions: Apply moisturizing lotion as directed Prim Dressing: KerraCel Ag Gelling Fiber Dressing, 2x2 in (silver alginate) 1 x Per Week/15 Days ary Discharge Instructions: Apply silver alginate to wound bed as instructed Secondary Dressing: Woven Gauze Sponge, Non-Sterile 4x4 in 1 x Per Week/15 Days Discharge Instructions: Apply over primary dressing as directed. Secondary Dressing: ABD Pad, 8x10 1 x Per Week/15 Days Discharge Instructions: Apply over primary dressing as directed. Compression Wrap: Kerlix Roll 4.5x3.1 (in/yd) 1 x Per Week/15 Days Discharge Instructions: Apply Kerlix and Coban compression as directed. Compression Wrap: Coban Self-Adherent Wrap 4x5 (in/yd) 1 x Per Week/15 Days Discharge Instructions: Apply over Kerlix as directed. Electronic Signature(s) Signed: 08/16/2020 12:00:52 PM By: Kalman Shan DO Entered By: Kalman Shan on 08/16/2020 11:56:09 -------------------------------------------------------------------------------- Problem List Details Patient Name: Date of Service: [name], [place]. 08/16/2020 10:30 A M Medical Record Number:  [id number] Patient Account Number: [id number] Date of Birth/Sex: Treating RN: 12-09-1925 (87 y.o. [name] Primary Care Provider: [name] Other Clinician: Referring Provider: Treating Provider/Extender: [name] in Treatment: 9 Active Problems ICD-10 Encounter Code Description Active Date MDM Diagnosis C44.92 Squamous cell carcinoma of skin, unspecified 06/11/2020 No Yes L97.819 Non-pressure chronic ulcer of other part of right lower leg with unspecified 06/11/2020 No Yes severity L97.829 Non-pressure chronic ulcer of other part of left lower leg with unspecified 06/11/2020 No Yes severity I87.2 Venous insufficiency (chronic) (peripheral) 06/11/2020 No Yes Inactive Problems Resolved Problems Electronic Signature(s) Signed: 08/16/2020 12:00:52 PM By: Kalman Shan DO Entered By: Kalman Shan on 08/16/2020 11:47:58 -------------------------------------------------------------------------------- Progress Note Details Patient Name: Date of Service: [name], [name] 08/16/2020 10:30 A M Medical Record Number: [id number] Patient Account Number: [id number] Date of Birth/Sex: Treating RN: 08/13/1925 (87 y.o. 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 She states that the wound on the right has been present for a year. The 1 on the left has opened up 1 month ago. She is followed with oncology for this issue as she had biopsies that showed squamous cell carcinoma. She is also seeing radiation oncology for treatment options. She presents today because she would like for her wounds to be healed by [place]. She currently denies signs of infection. 6/1; patient presents for 1 week follow-up. She states she has tolerated the leg wraps well. She states these do not bother her and is happy to continue with them. She is scheduled to see her oncologist today to go over treatment options for the bilateral lower extremity squamous cell carcinoma. Radiation is currently not a recommended option. Patient states she overall feels well.  6/22; patient presents for 3-week follow-up. She has tolerated the wraps well until her last wrap where she states they were uncomfortable. She attributes this to the home health nurse. She denies signs of infection. She has started her first treatment of antibody infusions for her Bilateral lower extremity squamous cell carcinoma. She has no complaints today. 7/21; patient presents for 1 month follow-up. Unfortunately she has not had good experience with her wrap changes with home health. She would like to do her own dressing changes. She continues to do her antibody infusions. She denies signs of infection. 7/28; patient presents for 1 week follow-up. At last clinic visit she was switched to daily dressing changes due to issues with the wrap and home health placing them. Unfortunately she has developed weeping to her legs bilaterally. She would like to be placed in wraps today. She would also like to follow with [place] weekly for wrap changes instead of having home health change them. She denies signs of infection. Patient History Information obtained from Patient. Family History Unknown  History. Social History Never smoker, Marital Status - Single, Alcohol Use - Never, Drug Use - No History, Caffeine Use - Never. Medical History Eyes Denies history of Cataracts, Optic Neuritis Ear/Nose/Mouth/Throat Denies history of Chronic sinus problems/congestion, Middle ear problems Hematologic/Lymphatic Patient has history of Anemia Denies history of Hemophilia, Human Immunodeficiency Virus, Lymphedema, Sickle Cell Disease Respiratory Denies history of Aspiration, Asthma, Chronic Obstructive Pulmonary Disease (COPD), Pneumothorax, Sleep Apnea, Tuberculosis Cardiovascular Patient has history of Arrhythmia - Atrial Flutter, A fibb, Congestive Heart Failure, Hypertension Denies history of Angina, Coronary Artery Disease, Deep Vein Thrombosis, Hypotension, Myocardial Infarction, Peripheral Arterial Disease, Peripheral Venous Disease, Phlebitis, Vasculitis Gastrointestinal Patient has history of Colitis Denies history of Cirrhosis , Crohnoos, Hepatitis A, Hepatitis B, Hepatitis C Endocrine Denies history of Type I Diabetes, Type II Diabetes Genitourinary Denies history of End Stage Renal Disease Immunological Denies history of Lupus Erythematosus, Raynaudoos, Scleroderma Integumentary (Skin) Denies history of History of Burn Musculoskeletal Patient has history of Osteoarthritis Denies history of Gout, Rheumatoid Arthritis, Osteomyelitis Neurologic Denies history of Dementia, Neuropathy, Quadriplegia, Paraplegia, Seizure Disorder Oncologic Denies history of Received Chemotherapy, Received Radiation Hospitalization/Surgery History - removal of rod left leg. 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 Vitals Time Taken: 10:18 AM, Height: 65 in, Weight: 163  lbs, BMI: 27.1, Temperature: 97.7 F, Pulse: 91 bpm, Respiratory Rate: 18 breaths/min, Blood Pressure: 144/81 mmHg. Cardiovascular 2+ dorsalis pedis/posterior tibialis pulses. Psychiatric pleasant and cooperative. General Notes: Bilateral lesions to the anterior shins with discoloration and irregular borders. Weeping to her legs bilaterally with excoriation noted. No increased warmth, tenderness or purulent drainage noted. Integumentary (Hair, Skin) Wound #6 status is Open. Original cause of wound was Blister. The date acquired was: 04/20/2020. The wound has been in treatment 9 weeks. The wound is located on the Right,Anterior Lower Leg. The wound measures 2.8cm length x 1.8cm width x 0.1cm depth; 3.958cm^2 area and 0.396cm^3 volume. There is Fat Layer (Subcutaneous Tissue) exposed. There is no tunneling or undermining noted. There is a medium amount of serosanguineous drainage noted. The wound margin is distinct with the outline attached to the wound base. There is large (67-100%) red, pink granulation within the wound bed. There is a small (1-33%) amount of necrotic tissue within the wound bed including Adherent Slough. Wound #7 status is Open. Original cause of wound  was Blister. The date acquired was: 04/20/2020. The wound has been in treatment 9 weeks. The wound is located on the Left,Anterior Lower Leg. The wound measures 2.2cm length x 1.4cm width x 0.1cm depth; 2.419cm^2 area and 0.242cm^3 volume. There is Fat Layer (Subcutaneous Tissue) exposed. There is no tunneling or undermining noted. There is a medium amount of serosanguineous drainage noted. The wound margin is distinct with the outline attached to the wound base. There is large (67-100%) red, pink granulation within the wound bed. There is a small (1-33%) amount of necrotic tissue within the wound bed including Adherent Slough. Assessment Active Problems ICD-10 Squamous cell carcinoma of skin, unspecified Non-pressure chronic  ulcer of other part of right lower leg with unspecified severity Non-pressure chronic ulcer of other part of left lower leg with unspecified severity Venous insufficiency (chronic) (peripheral) At last clinic visit patient did not want to do compression wraps and wanted to try daily dressing changes. Unfortunately she developed increased swelling with weeping to her legs bilaterally. There is excoriation however no signs of infection. No increased warmth or tenderness on exam. We discussed the option of continuing the wraps versus doing juxta light compression. At this time she would like to do wraps weekly with [place] in clinic. We will order juxta lite so she will have these available to her. We will continue with previous treatment of silver alginate under Kerlix/Coban. We may need to go up higher on the compression but this is what we have been using for the past couple months that have worked well. Plan Follow-up Appointments: Return Appointment in 1 week. - Dr. Heber [place] Bathing/ Shower/ Hygiene: May shower with protection but do not get wound dressing(s) wet. - Use cast protector Edema Control - Lymphedema / SCD / Other: Elevate legs to the level of the heart or above for 30 minutes daily and/or when sitting, a frequency of: - elevate the legs throughout the day heart level if possible. Avoid standing for long periods of time. Exercise regularly Compression stocking or Garment 30-40 mm/Hg pressure to: - wound center to order juxtalite HD compression garments. Bring in next appt time when you see Dr. Heber [place]. Additional Orders / Instructions: Follow Nutritious Diet Home Health: Discontinue home health for wound care. - wound center to ensure [facility] has been discharged. WOUND #6: - Lower Leg Wound Laterality: Right, Anterior Cleanser: Soap and Water 1 x Per Week/15 Days Discharge Instructions: May shower and wash wound with dial antibacterial soap and water prior to dressing  change. Cleanser: Wound Cleanser 1 x Per Week/15 Days Discharge Instructions: Cleanse the wound with wound cleanser prior to applying a clean dressing using gauze sponges, not tissue or cotton balls. Peri-Wound Care: Triamcinolone 15 (g) 1 x Per Week/15 Days Discharge Instructions: In clinic only. Use triamcinolone 15 (g) in clinic only. Peri-Wound Care: Sween Lotion (Moisturizing lotion) 1 x Per Week/15 Days Discharge Instructions: Apply moisturizing lotion as directed Prim Dressing: KerraCel Ag Gelling Fiber Dressing, 2x2 in (silver alginate) 1 x Per Week/15 Days ary Discharge Instructions: Apply silver alginate to wound bed as instructed Secondary Dressing: Woven Gauze Sponge, Non-Sterile 4x4 in 1 x Per Week/15 Days Discharge Instructions: Apply over primary dressing as directed. Secondary Dressing: ABD Pad, 8x10 1 x Per Week/15 Days Discharge Instructions: Apply over primary dressing as directed. Com pression Wrap: Kerlix Roll 4.5x3.1 (in/yd) 1 x Per Week/15 Days Discharge Instructions: Apply Kerlix and Coban compression as directed. Com pression Wrap: Coban Self-Adherent Wrap 4x5 (in/yd) 1 x  Per Week/15 Days Discharge Instructions: Apply over Kerlix as directed. Com pression Stockings: Circaid Juxta Lite Compression Wrap (DME) Compression Amount: 30-40 mmHg (left) Compression Amount: 30-40 mmHg (right) Discharge Instructions: Apply Circaid Juxta Lite Compression Wrap daily as instructed. Apply first thing in the morning, remove at night before bed. WOUND #7: - Lower Leg Wound Laterality: Left, Anterior Cleanser: Soap and Water 1 x Per Week/15 Days Discharge Instructions: May shower and wash wound with dial antibacterial soap and water prior to dressing change. Cleanser: Wound Cleanser 1 x Per Week/15 Days Discharge Instructions: Cleanse the wound with wound cleanser prior to applying a clean dressing using gauze sponges, not tissue or cotton balls. Peri-Wound Care: Triamcinolone 15  (g) 1 x Per Week/15 Days Discharge Instructions: In clinic only. Use triamcinolone 15 (g) in clinic only. Peri-Wound Care: Sween Lotion (Moisturizing lotion) 1 x Per Week/15 Days Discharge Instructions: Apply moisturizing lotion as directed Prim Dressing: KerraCel Ag Gelling Fiber Dressing, 2x2 in (silver alginate) 1 x Per Week/15 Days ary Discharge Instructions: Apply silver alginate to wound bed as instructed Secondary Dressing: Woven Gauze Sponge, Non-Sterile 4x4 in 1 x Per Week/15 Days Discharge Instructions: Apply over primary dressing as directed. Secondary Dressing: ABD Pad, 8x10 1 x Per Week/15 Days Discharge Instructions: Apply over primary dressing as directed. Com pression Wrap: Kerlix Roll 4.5x3.1 (in/yd) 1 x Per Week/15 Days Discharge Instructions: Apply Kerlix and Coban compression as directed. Com pression Wrap: Coban Self-Adherent Wrap 4x5 (in/yd) 1 x Per Week/15 Days Discharge Instructions: Apply over Kerlix as directed. 1. Silver alginate under Kerlix/Coban 2. Follow-up in 1 week 3. Order juxta lite Electronic Signature(s) Signed: 08/16/2020 12:00:52 PM By: Kalman Shan DO Entered By: Kalman Shan on 08/16/2020 12:00:05 -------------------------------------------------------------------------------- HxROS Details Patient Name: Date of Service: [name], [place]. 08/16/2020 10:30 A M Medical Record Number: [id number] Patient Account Number: [id number] Date of Birth/Sex: Treating RN: 1925/04/09 (87 y.o. [name] Primary Care Provider: [name] Other Clinician: Referring Provider: Treating Provider/Extender: [name] in Treatment: 9 Information Obtained From Patient Eyes Medical History: Negative for: Cataracts; Optic Neuritis Ear/Nose/Mouth/Throat Medical History: Negative for: Chronic sinus problems/congestion; Middle ear problems Hematologic/Lymphatic Medical History: Positive for: Anemia Negative  for: Hemophilia; Human Immunodeficiency Virus; Lymphedema; Sickle Cell Disease Respiratory Medical History: Negative for: Aspiration; Asthma; Chronic Obstructive Pulmonary Disease (COPD); Pneumothorax; Sleep Apnea; Tuberculosis Cardiovascular Medical History: Positive for: Arrhythmia - Atrial Flutter, A fibb; Congestive Heart Failure; Hypertension Negative for: Angina; Coronary Artery Disease; Deep Vein Thrombosis; Hypotension; Myocardial Infarction; Peripheral Arterial Disease; Peripheral Venous Disease; Phlebitis; Vasculitis Past Medical History Notes: hyperlipidemia Gastrointestinal Medical History: Positive for: Colitis Negative for: Cirrhosis ; Crohns; Hepatitis A; Hepatitis B; Hepatitis C Endocrine Medical History: Negative for: Type I Diabetes; Type II Diabetes Past Medical History Notes: hypothyroidism Genitourinary Medical History: Negative for: End Stage Renal Disease Immunological Medical History: Negative for: Lupus Erythematosus; Raynauds; Scleroderma Integumentary (Skin) Medical History: Negative for: History of Burn Musculoskeletal Medical History: Positive for: Osteoarthritis Negative for: Gout; Rheumatoid Arthritis; Osteomyelitis Neurologic Medical History: Negative for: Dementia; Neuropathy; Quadriplegia; Paraplegia; Seizure Disorder Past Medical History Notes: lumbar spindylolysis Oncologic Medical History: Negative for: Received Chemotherapy; Received Radiation Past Medical History Notes: BLE squamous ceel carcionoma Immunizations Pneumococcal Vaccine: Received Pneumococcal Vaccination: Yes Received Pneumococcal Vaccination On or After 60th Birthday: No Implantable Devices None Hospitalization / Surgery History Type of Hospitalization/Surgery removal of rod left leg Family and Social History Unknown History: Yes; Never smoker; Marital Status - Single; Alcohol Use: Never;  Drug Use: No History; Caffeine Use: Never; Financial Concerns: No; Food,  Clothing or Shelter Needs: No; Support System Lacking: No; Transportation Concerns: No Electronic Signature(s) Signed: 08/16/2020 12:00:52 PM By: Kalman Shan DO Signed: 08/16/2020 5:49:27 PM By: Deon Pilling Entered By: Kalman Shan on 08/16/2020 11:50:54 -------------------------------------------------------------------------------- SuperBill Details Patient Name: Date of Service: [name], [name] 08/16/2020 Medical Record Number: [id number] Patient Account Number: [id number] Date of Birth/Sex: Treating RN: May 05, 1925 (87 y.o. 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 L97.829 Non-pressure chronic ulcer of other part of left lower leg with unspecified severity I87.2 Venous insufficiency (chronic) (peripheral) Facility Procedures CPT4 Code: [id number] Description: [phone] - WOUND CARE VISIT-LEV 5 EST PT Modifier: Quantity: 1 Physician Procedures : CPT4 Code Description Modifier [id number] - WC PHYS LEVEL 3 - EST PT ICD-10 Diagnosis Description C44.92 Squamous cell carcinoma of skin, unspecified L97.819 Non-pressure chronic ulcer of other part of right lower leg with unspecified severity  L97.829 Non-pressure chronic ulcer of other part of left lower leg with unspecified severity I87.2 Venous insufficiency (chronic) (peripheral) Quantity: 1 Electronic Signature(s) Signed: 08/16/2020 12:00:52 PM By: Kalman Shan DO Entered By: Kalman Shan on 08/16/2020 12:00:20

## 2020-08-16 NOTE — Progress Notes (Signed)
[name] ([id number]) , Visit Report for 08/16/2020 Arrival Information Details Patient Name: Date of Service: [name], [place]. 08/16/2020 10:30 A M Medical Record Number: [id number] Patient Account Number: [id number] Date of Birth/Sex: Treating RN: [birth year] (87 y.o. [name] Primary Care Daleah Coulson: [name] Other Clinician: Referring Ricci Paff: Treating Daisa Stennis/Extender: [name] in Treatment: 9 Visit Information History Since Last Visit Added or deleted any medications: No Patient Arrived: Walker Any new allergies or adverse reactions: No Arrival Time: 10:14 Had a fall or experienced change in No Accompanied By: Daughter activities of daily living that may affect Transfer Assistance: None risk of falls: Patient Identification Verified: Yes Signs or symptoms of abuse/neglect since last visito No Secondary Verification Process Completed: Yes Hospitalized since last visit: No Patient Requires Transmission-Based No Implantable device outside of the clinic excluding No Precautions: cellular tissue based products placed in the center Patient Has Alerts: Yes since last visit: Patient Alerts: R ABI = Non compressible Has Dressing in Place as Prescribed: Yes L ABI = Non compressible Pain Present Now: Yes Electronic Signature(s) Signed: 08/16/2020 5:31:46 PM By: Lorrin Jackson Entered By: Lorrin Jackson on 08/16/2020 10:18:51 -------------------------------------------------------------------------------- Clinic Level of Care Assessment Details Patient Name: Date of Service: [name], [name] 08/16/2020 10:30 A M Medical Record Number: [id number] Patient Account Number: [id number] Date of Birth/Sex: Treating RN: 05-04-1925 (87 y.o. [name], [name] Primary Care Evlyn Amason: [name] Other Clinician: Referring Tevan Marian: Treating Tajanae Guilbault/Extender: [name] in Treatment:  9 Clinic Level of Care Assessment Items TOOL 4 Quantity Score X- 1 0 Use when only an EandM is performed on FOLLOW-UP visit ASSESSMENTS - Nursing Assessment / Reassessment X- 1 10 Reassessment of Co-morbidities (includes updates in patient status) X- 1 5 Reassessment of Adherence to Treatment Plan ASSESSMENTS - Wound and Skin A ssessment / Reassessment '[]'$  - 0 Simple Wound Assessment / Reassessment - one wound X- 2 5 Complex Wound Assessment / Reassessment - multiple wounds X- 1 10 Dermatologic / Skin Assessment (not related to wound area) ASSESSMENTS - Focused Assessment X- 2 5 Circumferential Edema Measurements - multi extremities X- 1 10 Nutritional Assessment / Counseling / Intervention '[]'$  - 0 Lower Extremity Assessment (monofilament, tuning fork, pulses) '[]'$  - 0 Peripheral Arterial Disease Assessment (using hand held doppler) ASSESSMENTS - Ostomy and/or Continence Assessment and Care '[]'$  - 0 Incontinence Assessment and Management '[]'$  - 0 Ostomy Care Assessment and Management (repouching, etc.) PROCESS - Coordination of Care '[]'$  - 0 Simple Patient / Family Education for ongoing care X- 1 20 Complex (extensive) Patient / Family Education for ongoing care X- 1 10 Staff obtains Programmer, systems, Records, T Results / Process Orders est '[]'$  - 0 Staff telephones HHA, Nursing Homes / Clarify orders / etc '[]'$  - 0 Routine Transfer to another Facility (non-emergent condition) '[]'$  - 0 Routine Hospital Admission (non-emergent condition) '[]'$  - 0 New Admissions / Biomedical engineer / Ordering NPWT Apligraf, etc. , '[]'$  - 0 Emergency Hospital Admission (emergent condition) '[]'$  - 0 Simple Discharge Coordination X- 1 15 Complex (extensive) Discharge Coordination PROCESS - Special Needs '[]'$  - 0 Pediatric / Minor Patient Management '[]'$  - 0 Isolation Patient Management '[]'$  - 0 Hearing / Language / Visual special needs '[]'$  - 0 Assessment of Community assistance (transportation, D/C  planning, etc.) '[]'$  - 0 Additional assistance / Altered mentation '[]'$  - 0 Support Surface(s) Assessment (bed, cushion, seat, etc.) INTERVENTIONS - Wound Cleansing / Measurement '[]'$  - 0 Simple  Wound Cleansing - one wound X- 2 5 Complex Wound Cleansing - multiple wounds X- 1 5 Wound Imaging (photographs - any number of wounds) '[]'$  - 0 Wound Tracing (instead of photographs) '[]'$  - 0 Simple Wound Measurement - one wound X- 2 5 Complex Wound Measurement - multiple wounds INTERVENTIONS - Wound Dressings '[]'$  - 0 Small Wound Dressing one or multiple wounds '[]'$  - 0 Medium Wound Dressing one or multiple wounds X- 2 20 Large Wound Dressing one or multiple wounds '[]'$  - 0 Application of Medications - topical '[]'$  - 0 Application of Medications - injection INTERVENTIONS - Miscellaneous '[]'$  - 0 External ear exam '[]'$  - 0 Specimen Collection (cultures, biopsies, blood, body fluids, etc.) '[]'$  - 0 Specimen(s) / Culture(s) sent or taken to Lab for analysis '[]'$  - 0 Patient Transfer (multiple staff / Civil Service fast streamer / Similar devices) '[]'$  - 0 Simple Staple / Suture removal (25 or less) '[]'$  - 0 Complex Staple / Suture removal (26 or more) '[]'$  - 0 Hypo / Hyperglycemic Management (close monitor of Blood Glucose) '[]'$  - 0 Ankle / Brachial Index (ABI) - do not check if billed separately X- 1 5 Vital Signs Has the patient been seen at the hospital within the last three years: Yes Total Score: 170 Level Of Care: New/Established - Level 5 Electronic Signature(s) Signed: 08/16/2020 5:49:27 PM By: Deon Pilling Entered By: Deon Pilling on 08/16/2020 11:44:23 -------------------------------------------------------------------------------- Encounter Discharge Information Details Patient Name: Date of Service: [name], [name] 08/16/2020 10:30 A M Medical Record Number: [id number] Patient Account Number: [id number] Date of Birth/Sex: Treating RN: April 07, 1925 (87 y.o. Elam Dutch Primary Care Jailyne Chieffo:  [name] Other Clinician: Referring Devan Danzer: Treating Dorrien Grunder/Extender: [name] in Treatment: 9 Encounter Discharge Information Items Discharge Condition: Stable Ambulatory Status: Walker Discharge Destination: Home Transportation: Private Auto Accompanied By: daughter Schedule Follow-up Appointment: Yes Clinical Summary of Care: Patient Declined Electronic Signature(s) Signed: 08/16/2020 5:27:01 PM By: Baruch Gouty RN, BSN Entered By: Baruch Gouty on 08/16/2020 11:41:29 -------------------------------------------------------------------------------- Lower Extremity Assessment Details Patient Name: Date of Service: [name], [name] 08/16/2020 10:30 A M Medical Record Number: [id number] Patient Account Number: [id number] Date of Birth/Sex: Treating RN: 11/20/1925 (87 y.o. [name] Primary Care Makani Seckman: [name] Other Clinician: Referring Kevyn Boquet: Treating Yoshi Vicencio/Extender: [name] in Treatment: 9 Edema Assessment Assessed: [Left: Yes] [Right: Yes] Edema: [Left: Yes] [Right: Yes] Calf Left: Right: Point of Measurement: 30 cm From Medial Instep 46 cm 45.5 cm Ankle Left: Right: Point of Measurement: 9 cm From Medial Instep 24 cm 24.8 cm Knee To Floor Left: Right: From Medial Instep 48 cm 48 cm Vascular Assessment Pulses: Dorsalis Pedis Palpable: [Left:Yes] [Right:Yes] Electronic Signature(s) Signed: 08/16/2020 5:31:46 PM By: Lorrin Jackson Signed: 08/16/2020 5:49:27 PM By: Deon Pilling Entered By: Deon Pilling on 08/16/2020 10:55:10 -------------------------------------------------------------------------------- Multi Wound Chart Details Patient Name: Date of Service: [name], [name] 08/16/2020 10:30 A M Medical Record Number: [id number] Patient Account Number: [id number] Date of Birth/Sex: Treating RN: 01/18/1926 (87 y.o. [name] Primary Care  Brylynn Hanssen: [name] Other Clinician: Referring Coty Larsh: Treating Mickelle Goupil/Extender: [name] in Treatment: 9 Vital Signs Height(in): 65 Pulse(bpm): 91 Weight(lbs): 163 Blood Pressure(mmHg): 144/81 Body Mass Index(BMI): 27 Temperature(F): 97.7 Respiratory Rate(breaths/min): 18 Photos: [N/A:N/A] Right, Anterior Lower Leg Left, Anterior Lower Leg N/A Wound Location: Blister Blister N/A Wounding Event: Malignant Wound Malignant Wound N/A Primary Etiology: Anemia, Arrhythmia, Congestive Heart Anemia, Arrhythmia,  Congestive Heart N/A Comorbid History: Failure, Hypertension, Colitis, Failure, Hypertension, Colitis, Osteoarthritis Osteoarthritis 04/20/2020 04/20/2020 N/A Date Acquired: 9 9 N/A Weeks of Treatment: Open Open N/A Wound Status: 2.8x1.8x0.1 2.2x1.4x0.1 N/A Measurements L x W x D (cm) 3.958 2.419 N/A A (cm) : rea 0.396 0.242 N/A Volume (cm) : 37.40% 21.20% N/A % Reduction in Area: 79.10% 21.20% N/A % Reduction in Volume: Full Thickness Without Exposed Full Thickness Without Exposed N/A Classification: Support Structures Support Structures Medium Medium N/A Exudate Amount: Serosanguineous Serosanguineous N/A Exudate Type: red, brown red, brown N/A Exudate Color: Distinct, outline attached Distinct, outline attached N/A Wound Margin: Large (67-100%) Large (67-100%) N/A Granulation Amount: Red, Pink Red, Pink N/A Granulation Quality: Small (1-33%) Small (1-33%) N/A Necrotic Amount: Fat Layer (Subcutaneous Tissue): Yes Fat Layer (Subcutaneous Tissue): Yes N/A Exposed Structures: Fascia: No Fascia: No Tendon: No Tendon: No Muscle: No Muscle: No Joint: No Joint: No Bone: No Bone: No Small (1-33%) Small (1-33%) N/A Epithelialization: Treatment Notes Wound #6 (Lower Leg) Wound Laterality: Right, Anterior Cleanser Soap and Water Discharge Instruction: May shower and wash wound with dial antibacterial  soap and water prior to dressing change. Wound Cleanser Discharge Instruction: Cleanse the wound with wound cleanser prior to applying a clean dressing using gauze sponges, not tissue or cotton balls. Peri-Wound Care Triamcinolone 15 (g) Discharge Instruction: In clinic only. Use triamcinolone 15 (g) in clinic only. Sween Lotion (Moisturizing lotion) Discharge Instruction: Apply moisturizing lotion as directed Topical Primary Dressing KerraCel Ag Gelling Fiber Dressing, 2x2 in (silver alginate) Discharge Instruction: Apply silver alginate to wound bed as instructed Secondary Dressing Woven Gauze Sponge, Non-Sterile 4x4 in Discharge Instruction: Apply over primary dressing as directed. ABD Pad, 8x10 Discharge Instruction: Apply over primary dressing as directed. Secured With Compression Wrap Kerlix Roll 4.5x3.1 (in/yd) Discharge Instruction: Apply Kerlix and Coban compression as directed. Coban Self-Adherent Wrap 4x5 (in/yd) Discharge Instruction: Apply over Kerlix as directed. Compression Stockings Circaid Juxta Lite Compression Wrap Quantity: 1 Left Leg Compression Amount: 30-40 mmHg Right Leg Compression Amount: 30-40 mmHg Discharge Instruction: Apply Circaid Juxta Lite Compression Wrap daily as instructed. Apply first thing in the morning, remove at night before bed. Add-Ons Wound #7 (Lower Leg) Wound Laterality: Left, Anterior Cleanser Soap and Water Discharge Instruction: May shower and wash wound with dial antibacterial soap and water prior to dressing change. Wound Cleanser Discharge Instruction: Cleanse the wound with wound cleanser prior to applying a clean dressing using gauze sponges, not tissue or cotton balls. Peri-Wound Care Triamcinolone 15 (g) Discharge Instruction: In clinic only. Use triamcinolone 15 (g) in clinic only. Sween Lotion (Moisturizing lotion) Discharge Instruction: Apply moisturizing lotion as directed Topical Primary Dressing KerraCel Ag  Gelling Fiber Dressing, 2x2 in (silver alginate) Discharge Instruction: Apply silver alginate to wound bed as instructed Secondary Dressing Woven Gauze Sponge, Non-Sterile 4x4 in Discharge Instruction: Apply over primary dressing as directed. ABD Pad, 8x10 Discharge Instruction: Apply over primary dressing as directed. Secured With Compression Wrap Kerlix Roll 4.5x3.1 (in/yd) Discharge Instruction: Apply Kerlix and Coban compression as directed. Coban Self-Adherent Wrap 4x5 (in/yd) Discharge Instruction: Apply over Kerlix as directed. Compression Stockings Add-Ons Electronic Signature(s) Signed: 08/16/2020 12:00:52 PM By: Kalman Shan DO Signed: 08/16/2020 5:49:27 PM By: Deon Pilling Entered By: Kalman Shan on 08/16/2020 11:48:38 -------------------------------------------------------------------------------- Multi-Disciplinary Care Plan Details Patient Name: Date of Service: [name], [name] [place]. 08/16/2020 10:30 A M Medical Record Number: [id number] Patient Account Number: [id number] Date of Birth/Sex: Treating RN: 25-Dec-1925 (87 y.o. [name] Primary Care Erwin Nishiyama: [name],  [name] Other Clinician: Referring Aadarsh Cozort: Treating Nellene Courtois/Extender: [name] in Treatment: [place] reviewed with physician Active Inactive Wound/Skin Impairment Nursing Diagnoses: Impaired tissue integrity Knowledge deficit related to ulceration/compromised skin integrity Goals: Patient/caregiver will verbalize understanding of skin care regimen Date Initiated: 06/11/2020 Target Resolution Date: 09/05/2020 Goal Status: Active Interventions: Assess patient/caregiver ability to obtain necessary supplies Assess patient/caregiver ability to perform ulcer/skin care regimen upon admission and as needed Assess ulceration(s) every visit Provide education on ulcer and skin care Notes: Electronic Signature(s) Signed: 08/16/2020  5:49:27 PM By: Deon Pilling Entered By: Deon Pilling on 08/16/2020 10:45:47 -------------------------------------------------------------------------------- Pain Assessment Details Patient Name: Date of Service: [name], [name] 08/16/2020 10:30 A M Medical Record Number: [id number] Patient Account Number: [id number] Date of Birth/Sex: Treating RN: [birth year] (87 y.o. [name] Primary Care Haruo Stepanek: [name] Other Clinician: Referring Meri Pelot: Treating Laynee Lockamy/Extender: [name] in Treatment: 9 Active Problems Location of Pain Severity and Description of Pain Patient Has Paino Yes Site Locations Pain Location: Pain in Ulcers With Dressing Change: Yes Rate the pain. Current Pain Level: 7 Character of Pain Describe the Pain: Burning, Tender, Throbbing Pain Management and Medication Current Pain Management: Medication: Yes Cold Application: No Rest: Yes Massage: No Activity: No T.E.N.S.: No Heat Application: No Leg drop or elevation: No Is the Current Pain Management Adequate: Inadequate How does your wound impact your activities of daily livingo Sleep: Yes Bathing: No Appetite: No Relationship With Others: No Bladder Continence: No Emotions: No Bowel Continence: No Work: No Toileting: No Drive: No Dressing: No Hobbies: No Electronic Signature(s) Signed: 08/16/2020 5:31:46 PM By: Lorrin Jackson Entered By: Lorrin Jackson on 08/16/2020 10:21:07 -------------------------------------------------------------------------------- Patient/Caregiver Education Details Patient Name: Date of Service: [name], [name] 7/28/2022andnbsp10:30 A M Medical Record Number: [id number] Patient Account Number: [id number] Date of Birth/Gender: Treating RN: 1925-03-06 (87 y.o. [name] Primary Care Physician: [name] Other Clinician: Referring Physician: Treating Physician/Extender: [name] in Treatment: 9 Education Assessment Education Provided To: Patient Education Topics Provided Wound/Skin Impairment: Handouts: Skin Care Do's and Dont's Methods: Explain/Verbal Responses: Reinforcements needed Electronic Signature(s) Signed: 08/16/2020 5:49:27 PM By: Deon Pilling Entered By: Deon Pilling on 08/16/2020 10:46:01 -------------------------------------------------------------------------------- Wound Assessment Details Patient Name: Date of Service: [name], [name] 08/16/2020 10:30 A M Medical Record Number: [id number] Patient Account Number: [id number] Date of Birth/Sex: Treating RN: 1925-04-12 (87 y.o. [name] Primary Care Aakash Hollomon: [name] Other Clinician: Referring Analissa Bayless: Treating Lugene Beougher/Extender: [name] in Treatment: 9 Wound Status Wound Number: 6 Primary Malignant Wound Etiology: Wound Location: Right, Anterior Lower Leg Wound Open Wounding Event: Blister Status: Date Acquired: 04/20/2020 Comorbid Anemia, Arrhythmia, Congestive Heart Failure, Hypertension, Weeks Of Treatment: 9 History: Colitis, Osteoarthritis Clustered Wound: No Photos Wound Measurements Length: (cm) 2.8 Width: (cm) 1.8 Depth: (cm) 0.1 Area: (cm) 3.958 Volume: (cm) 0.396 % Reduction in Area: 37.4% % Reduction in Volume: 79.1% Epithelialization: Small (1-33%) Tunneling: No Undermining: No Wound Description Classification: Full Thickness Without Exposed Support Structures Wound Margin: Distinct, outline attached Exudate Amount: Medium Exudate Type: Serosanguineous Exudate Color: red, brown Foul Odor After Cleansing: No Slough/Fibrino Yes Wound Bed Granulation Amount: Large (67-100%) Exposed Structure Granulation Quality: Red, Pink Fascia Exposed: No Necrotic Amount: Small (1-33%) Fat Layer (Subcutaneous Tissue) Exposed: Yes Necrotic Quality: Adherent Slough Tendon  Exposed: No Muscle Exposed: No Joint Exposed: No Bone Exposed: No Treatment Notes Wound #6 (  Lower Leg) Wound Laterality: Right, Anterior Cleanser Soap and Water Discharge Instruction: May shower and wash wound with dial antibacterial soap and water prior to dressing change. Wound Cleanser Discharge Instruction: Cleanse the wound with wound cleanser prior to applying a clean dressing using gauze sponges, not tissue or cotton balls. Peri-Wound Care Triamcinolone 15 (g) Discharge Instruction: In clinic only. Use triamcinolone 15 (g) in clinic only. Sween Lotion (Moisturizing lotion) Discharge Instruction: Apply moisturizing lotion as directed Topical Primary Dressing KerraCel Ag Gelling Fiber Dressing, 2x2 in (silver alginate) Discharge Instruction: Apply silver alginate to wound bed as instructed Secondary Dressing Woven Gauze Sponge, Non-Sterile 4x4 in Discharge Instruction: Apply over primary dressing as directed. ABD Pad, 8x10 Discharge Instruction: Apply over primary dressing as directed. Secured With Compression Wrap Kerlix Roll 4.5x3.1 (in/yd) Discharge Instruction: Apply Kerlix and Coban compression as directed. Coban Self-Adherent Wrap 4x5 (in/yd) Discharge Instruction: Apply over Kerlix as directed. Compression Stockings Circaid Juxta Lite Compression Wrap Quantity: 1 Left Leg Compression Amount: 30-40 mmHg Right Leg Compression Amount: 30-40 mmHg Discharge Instruction: Apply Circaid Juxta Lite Compression Wrap daily as instructed. Apply first thing in the morning, remove at night before bed. Add-Ons Electronic Signature(s) Signed: 08/16/2020 5:31:46 PM By: Lorrin Jackson Entered By: Lorrin Jackson on 08/16/2020 10:32:42 -------------------------------------------------------------------------------- Wound Assessment Details Patient Name: Date of Service: [name], [name] [place]. 08/16/2020 10:30 A M Medical Record Number: [id number] Patient Account Number: [id number] Date  of Birth/Sex: Treating RN: 03-08-1925 (87 y.o. [name] Primary Care Marisella Puccio: [name] Other Clinician: Referring Hildreth Orsak: Treating Analis Distler/Extender: [name] in Treatment: 9 Wound Status Wound Number: 7 Primary Malignant Wound Etiology: Wound Location: Left, Anterior Lower Leg Wound Open Wounding Event: Blister Status: Date Acquired: 04/20/2020 Date Acquired: 04/20/2020 Comorbid Anemia, Arrhythmia, Congestive Heart Failure, Hypertension, Weeks Of Treatment: 9 History: Colitis, Osteoarthritis Clustered Wound: No Photos Wound Measurements Length: (cm) 2.2 Width: (cm) 1.4 Depth: (cm) 0.1 Area: (cm) 2.419 Volume: (cm) 0.242 % Reduction in Area: 21.2% % Reduction in Volume: 21.2% Epithelialization: Small (1-33%) Tunneling: No Undermining: No Wound Description Classification: Full Thickness Without Exposed Support Structures Wound Margin: Distinct, outline attached Exudate Amount: Medium Exudate Type: Serosanguineous Exudate Color: red, brown Foul Odor After Cleansing: No Slough/Fibrino Yes Wound Bed Granulation Amount: Large (67-100%) Exposed Structure Granulation Quality: Red, Pink Fascia Exposed: No Necrotic Amount: Small (1-33%) Fat Layer (Subcutaneous Tissue) Exposed: Yes Necrotic Quality: Adherent Slough Tendon Exposed: No Muscle Exposed: No Joint Exposed: No Bone Exposed: No Treatment Notes Wound #7 (Lower Leg) Wound Laterality: Left, Anterior Cleanser Soap and Water Discharge Instruction: May shower and wash wound with dial antibacterial soap and water prior to dressing change. Wound Cleanser Discharge Instruction: Cleanse the wound with wound cleanser prior to applying a clean dressing using gauze sponges, not tissue or cotton balls. Peri-Wound Care Triamcinolone 15 (g) Discharge Instruction: In clinic only. Use triamcinolone 15 (g) in clinic only. Sween Lotion (Moisturizing lotion) Discharge  Instruction: Apply moisturizing lotion as directed Topical Primary Dressing KerraCel Ag Gelling Fiber Dressing, 2x2 in (silver alginate) Discharge Instruction: Apply silver alginate to wound bed as instructed Secondary Dressing Woven Gauze Sponge, Non-Sterile 4x4 in Discharge Instruction: Apply over primary dressing as directed. ABD Pad, 8x10 Discharge Instruction: Apply over primary dressing as directed. Secured With Compression Wrap Kerlix Roll 4.5x3.1 (in/yd) Discharge Instruction: Apply Kerlix and Coban compression as directed. Coban Self-Adherent Wrap 4x5 (in/yd) Discharge Instruction: Apply over Kerlix as directed. Compression Stockings Add-Ons Electronic Signature(s) Signed: 08/16/2020 5:31:46 PM By: Lorrin Jackson  Entered By: Lorrin Jackson on 08/16/2020 10:33:36 -------------------------------------------------------------------------------- Vitals Details Patient Name: Date of Service: [name], [place]. 08/16/2020 10:30 A M Medical Record Number: [id number] Patient Account Number: [id number] Date of Birth/Sex: Treating RN: November 11, 1925 (87 y.o. [name] Primary Care Vedansh Kerstetter: [name] Other Clinician: Referring Elleanna Melling: Treating Karsynn Deweese/Extender: [name] in Treatment: 9 Vital Signs Time Taken: 10:18 Temperature (F): 97.7 Height (in): 65 Pulse (bpm): 91 Weight (lbs): 163 Respiratory Rate (breaths/min): 18 Body Mass Index (BMI): 27.1 Blood Pressure (mmHg): 144/81 Reference Range: 80 - 120 mg / dl Electronic Signature(s) Signed: 08/16/2020 5:31:46 PM By: Lorrin Jackson Entered By: Lorrin Jackson on 08/16/2020 10:19:24

## 2020-08-23 ENCOUNTER — Encounter (HOSPITAL_BASED_OUTPATIENT_CLINIC_OR_DEPARTMENT_OTHER): Payer: Medicare Other | Attending: Internal Medicine | Admitting: Internal Medicine

## 2020-08-23 ENCOUNTER — Other Ambulatory Visit: Payer: Self-pay

## 2020-08-23 DIAGNOSIS — L97819 Non-pressure chronic ulcer of other part of right lower leg with unspecified severity: Secondary | ICD-10-CM | POA: Insufficient documentation

## 2020-08-23 DIAGNOSIS — I872 Venous insufficiency (chronic) (peripheral): Secondary | ICD-10-CM | POA: Insufficient documentation

## 2020-08-23 DIAGNOSIS — C44722 Squamous cell carcinoma of skin of right lower limb, including hip: Secondary | ICD-10-CM | POA: Insufficient documentation

## 2020-08-23 DIAGNOSIS — L97829 Non-pressure chronic ulcer of other part of left lower leg with unspecified severity: Secondary | ICD-10-CM | POA: Diagnosis not present

## 2020-08-23 DIAGNOSIS — C4492 Squamous cell carcinoma of skin, unspecified: Secondary | ICD-10-CM | POA: Diagnosis not present

## 2020-08-23 DIAGNOSIS — C44729 Squamous cell carcinoma of skin of left lower limb, including hip: Secondary | ICD-10-CM | POA: Insufficient documentation

## 2020-08-24 NOTE — Progress Notes (Signed)
[name] ([id number]) , Visit Report for 08/23/2020 Chief Complaint Document Details Patient Name: Date of Service: [name], [name] 08/23/2020 9:30 A M Medical Record Number: [id number] Patient Account Number: [id number] Date of Birth/Sex: Treating RN: 1925/05/13 (87 y.o. [name] Primary Care Provider: [name] Other Clinician: Referring Provider: Treating Provider/Extender: [name] in Treatment: 10 Information Obtained from: Patient Chief Complaint Bilateral lower extremity wounds that have been biopsied and positive for squamous cell carcinoma Electronic Signature(s) Signed: 08/23/2020 10:26:10 AM By: Kalman Shan DO Entered By: Kalman Shan on 08/23/2020 10:20:18 -------------------------------------------------------------------------------- HPI Details Patient Name: Date of Service: [name], [name] 08/23/2020 9:30 A M Medical Record Number: [id number] Patient Account Number: [id number] Date of Birth/Sex: Treating RN: 21-Sep-1925 (87 y.o. 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 She states that the wound on the right has been present for a year. The 1 on the left has opened up 1 month ago. She is followed with oncology for this issue as she had biopsies that showed squamous cell carcinoma. She is also seeing radiation oncology for treatment options. She presents today because she would like for her wounds to be healed by  [place]. She currently denies signs of infection. 6/1; patient presents for 1 week follow-up. She states she has tolerated the leg wraps well. She states these do not bother her and is happy to continue with them. She is scheduled to see her oncologist today to go over treatment options for the bilateral lower extremity squamous cell carcinoma. Radiation is currently not a recommended option. Patient states she overall feels well. 6/22; patient presents for 3-week follow-up. She has tolerated the wraps well until her last wrap where she states they were uncomfortable. She attributes this to the home health nurse. She denies signs of infection. She has started her first treatment of antibody infusions for her Bilateral lower extremity squamous cell carcinoma. She has no complaints today. 7/21; patient presents for 1 month follow-up. Unfortunately she has not had good experience with her wrap changes with home health. She would like to do her own dressing changes. She continues to do her antibody infusions. She denies signs of infection. 7/28; patient presents for 1 week follow-up. At last clinic visit she was switched to daily dressing changes due to issues with the wrap and home health placing them. Unfortunately she has developed weeping to her legs bilaterally. She would like to be placed in wraps today. She would also like to follow with [place] weekly for wrap changes instead of having home health change them. She denies signs of infection. 8/4; patient presents for 1 week follow-up. She has tolerated the Kerlix/Coban wraps well. She no longer has weeping to her legs. She took the wrap off 1 day before coming in to be able to take a shower. She has no issues or complaints today. She denies signs of infection. Electronic Signature(s) Signed: 08/23/2020 10:26:10 AM By: Kalman Shan DO Signed: 08/23/2020 10:26:10 AM By: Kalman Shan DO Entered By: Kalman Shan on 08/23/2020  10:21:20 -------------------------------------------------------------------------------- Physical Exam  Details Patient Name: Date of Service: [name], [name] 08/23/2020 9:30 A M Medical Record Number: [id number] Patient Account Number: [id number] Date of Birth/Sex: Treating RN: Apr 23, 1925 (87 y.o. [name] Primary Care Provider: [name] Other Clinician: Referring Provider: Treating Provider/Extender: [name] in Treatment: 10 Constitutional respirations regular, non-labored and within target range for patient.. Cardiovascular 2+ dorsalis pedis/posterior tibialis pulses. Psychiatric pleasant and cooperative. Notes Lesions to her legs bilaterally with granulation tissue present and slight discoloration. No weeping to her legs. No signs of infection. Electronic Signature(s) Signed: 08/23/2020 10:26:10 AM By: Kalman Shan DO Entered By: Kalman Shan on 08/23/2020 10:22:11 -------------------------------------------------------------------------------- Physician Orders Details Patient Name: Date of Service: [name], Wilmoth A. 08/23/2020 9:30 A M Medical Record Number: [id number] Patient Account Number: [id number] Date of Birth/Sex: Treating RN: 1925/07/24 (87 y.o. [name], [name] Primary Care Provider: [name] Other Clinician: Referring Provider: Treating Provider/Extender: [name] in Treatment: 10 Verbal / Phone Orders: No Diagnosis Coding ICD-10 Coding Code Description C44.92 Squamous cell carcinoma of skin, unspecified L97.819 Non-pressure chronic ulcer of other part of right lower leg with unspecified severity L97.829 Non-pressure chronic ulcer of other part of left lower leg with unspecified severity I87.2 Venous insufficiency (chronic) (peripheral) Follow-up Appointments Return appointment in 3 weeks. - Dr. Heber Remsen 09/13/2020 bring in juxtalite compression garments  in on this visit. Nurse Visit: - weekly- 08/29/2020, 09/06/2020 Bathing/ Shower/ Hygiene May shower with protection but do not get wound dressing(s) wet. - Use cast protector Edema Control - Lymphedema / SCD / Other Elevate legs to the level of the heart or above for 30 minutes daily and/or when sitting, a frequency of: - elevate the legs throughout the day heart level if possible. Avoid standing for long periods of time. Exercise regularly Compression stocking or Garment 30-40 mm/Hg pressure to: - wound center to order juxtalite HD compression garments. Bring in next appt time when you see Dr. Heber Lund. Additional Orders / Instructions Follow [facility] home [facility] for wound care. - wound center to ensure [facility] has been discharged. Wound Treatment Wound #6 - Lower Leg Wound Laterality: Right, Anterior Cleanser: Soap and Water 1 x Per Week/15 Days Discharge Instructions: May shower and wash wound with dial antibacterial soap and water prior to dressing change. Cleanser: Wound Cleanser 1 x Per Week/15 Days Discharge Instructions: Cleanse the wound with wound cleanser prior to applying a clean dressing using gauze sponges, not tissue or cotton balls. Peri-Wound Care: Triamcinolone 15 (g) 1 x Per Week/15 Days Discharge Instructions: In clinic only. Use triamcinolone 15 (g) in clinic only. Peri-Wound Care: Sween Lotion (Moisturizing lotion) 1 x Per Week/15 Days Discharge Instructions: Apply moisturizing lotion as directed Prim Dressing: KerraCel Ag Gelling Fiber Dressing, 2x2 in (silver alginate) 1 x Per Week/15 Days ary Discharge Instructions: Apply silver alginate to wound bed as instructed Secondary Dressing: Woven Gauze Sponge, Non-Sterile 4x4 in 1 x Per Week/15 Days Discharge Instructions: Apply over primary dressing as directed. Secondary Dressing: ABD Pad, 8x10 1 x Per Week/15 Days Discharge Instructions: Apply over primary dressing as  directed. Compression Wrap: Kerlix Roll 4.5x3.1 (in/yd) 1 x Per Week/15 Days Discharge Instructions: Apply Kerlix and Coban compression as directed. Compression Wrap: Coban Self-Adherent Wrap 4x5 (in/yd) 1 x Per Week/15 Days Discharge Instructions: Apply over Kerlix as directed. Compression Stockings: Circaid Juxta Lite Compression Wrap Left Leg Compression Amount: 30-40 mmHG Right Leg Compression Amount: 30-40 mmHG Discharge  Instructions: Apply Circaid Juxta Lite Compression Wrap daily as instructed. Apply first thing in the morning, remove at night before bed. Wound #7 - Lower Leg Wound Laterality: Left, Anterior Cleanser: Soap and Water 1 x Per Week/15 Days Discharge Instructions: May shower and wash wound with dial antibacterial soap and water prior to dressing change. Cleanser: Wound Cleanser 1 x Per Week/15 Days Discharge Instructions: Cleanse the wound with wound cleanser prior to applying a clean dressing using gauze sponges, not tissue or cotton balls. Peri-Wound Care: Triamcinolone 15 (g) 1 x Per Week/15 Days Discharge Instructions: In clinic only. Use triamcinolone 15 (g) in clinic only. Peri-Wound Care: Sween Lotion (Moisturizing lotion) 1 x Per Week/15 Days Discharge Instructions: Apply moisturizing lotion as directed Prim Dressing: KerraCel Ag Gelling Fiber Dressing, 2x2 in (silver alginate) 1 x Per Week/15 Days ary Discharge Instructions: Apply silver alginate to wound bed as instructed Secondary Dressing: Woven Gauze Sponge, Non-Sterile 4x4 in 1 x Per Week/15 Days Discharge Instructions: Apply over primary dressing as directed. Secondary Dressing: ABD Pad, 8x10 1 x Per Week/15 Days Discharge Instructions: Apply over primary dressing as directed. Compression Wrap: Kerlix Roll 4.5x3.1 (in/yd) 1 x Per Week/15 Days Discharge Instructions: Apply Kerlix and Coban compression as directed. Compression Wrap: Coban Self-Adherent Wrap 4x5 (in/yd) 1 x Per Week/15 Days Discharge  Instructions: Apply over Kerlix as directed. Electronic Signature(s) Signed: 08/23/2020 10:26:10 AM By: Kalman Shan DO Entered By: Kalman Shan on 08/23/2020 10:22:24 -------------------------------------------------------------------------------- Problem List Details Patient Name: Date of Service: [name], [name] 08/23/2020 9:30 A M Medical Record Number: [id number] Patient Account Number: [id number] Date of Birth/Sex: Treating RN: [birth year] (87 y.o. [name] Primary Care Provider: [name] Other Clinician: Referring Provider: Treating Provider/Extender: [name] in Treatment: 10 Active Problems ICD-10 Encounter Code Description Active Date MDM Diagnosis C44.92 Squamous cell carcinoma of skin, unspecified 06/11/2020 No Yes L97.819 Non-pressure chronic ulcer of other part of right lower leg with unspecified 06/11/2020 No Yes severity L97.829 Non-pressure chronic ulcer of other part of left lower leg with unspecified 06/11/2020 No Yes severity I87.2 Venous insufficiency (chronic) (peripheral) 06/11/2020 No Yes Inactive Problems Resolved Problems Electronic Signature(s) Signed: 08/23/2020 10:26:10 AM By: Kalman Shan DO Entered By: Kalman Shan on 08/23/2020 10:20:04 -------------------------------------------------------------------------------- Progress Note Details Patient Name: Date of Service: [name], [name] 08/23/2020 9:30 A M Medical Record Number: [id number] Patient Account Number: [id number] Date of Birth/Sex: Treating RN: 15-May-1925 (87 y.o. 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 She states that the wound on the right has been present for a year. The 1 on the left has opened up 1 month ago. She is followed with oncology for this issue as she had biopsies that showed squamous cell carcinoma. She is also seeing radiation oncology for treatment options. She presents today because she would like for her wounds to be healed by [place]. She currently denies  signs of infection. 6/1; patient presents for 1 week follow-up. She states she has tolerated the leg wraps well. She states these do not bother her and is happy to continue with them. She is scheduled to see her oncologist today to go over treatment options for the bilateral lower extremity squamous cell carcinoma. Radiation is currently not a recommended option. Patient states she overall feels well. 6/22; patient presents for 3-week follow-up. She has tolerated the wraps well until her last wrap where she states they were uncomfortable. She attributes this to the home health nurse. She denies signs of infection. She has started her first treatment of antibody infusions for her Bilateral lower extremity squamous cell carcinoma. She has no complaints today. 7/21; patient presents for 1 month follow-up. Unfortunately she has not had good experience with her wrap changes with home health. She would like to do her own dressing changes. She continues to do her antibody infusions. She denies signs of infection. 7/28; patient presents for 1 week follow-up. At last clinic visit she was switched to daily dressing changes due to issues with the  wrap and home health placing them. Unfortunately she has developed weeping to her legs bilaterally. She would like to be placed in wraps today. She would also like to follow with [place] weekly for wrap changes instead of having home health change them. She denies signs of infection. 8/4; patient presents for 1 week follow-up. She has tolerated the Kerlix/Coban wraps well. She no longer has weeping to her legs. She took the wrap off 1 day before coming in to be able to take a shower. She has no issues or complaints today. She denies signs of infection. Patient History Information obtained from Patient. Family History Unknown History. Social History Never smoker, Marital Status - Single, Alcohol Use - Never, Drug Use - No History, Caffeine Use - Never. Medical History Eyes Denies history of Cataracts, Optic Neuritis Ear/Nose/Mouth/Throat Denies history of Chronic sinus problems/congestion, Middle ear problems Hematologic/Lymphatic Patient has history of Anemia Denies history of Hemophilia, Human Immunodeficiency Virus, Lymphedema, Sickle Cell Disease Respiratory Denies history of Aspiration, Asthma, Chronic Obstructive Pulmonary Disease (COPD), Pneumothorax, Sleep Apnea, Tuberculosis Cardiovascular Patient has history of Arrhythmia - Atrial Flutter, A fibb, Congestive Heart Failure, Hypertension Denies history of Angina, Coronary Artery Disease, Deep Vein Thrombosis, Hypotension, Myocardial Infarction, Peripheral Arterial Disease, Peripheral Venous Disease, Phlebitis, Vasculitis Gastrointestinal Patient has history of Colitis Denies history of Cirrhosis , Crohnoos, Hepatitis A, Hepatitis B, Hepatitis C Endocrine Denies history of Type I Diabetes, Type II Diabetes Genitourinary Denies history of End Stage Renal Disease Immunological Denies history of Lupus Erythematosus, Raynaudoos, Scleroderma Integumentary (Skin) Denies history of History of Burn Musculoskeletal Patient has history  of Osteoarthritis Denies history of Gout, Rheumatoid Arthritis, Osteomyelitis Neurologic Denies history of Dementia, Neuropathy, Quadriplegia, Paraplegia, Seizure Disorder Oncologic Denies history of Received Chemotherapy, Received Radiation Hospitalization/Surgery History - removal of rod left leg. 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 Vitals Time Taken: 9:32 AM, Height: 65 in, Weight: 163 lbs, BMI: 27.1, Temperature: 97.8 F, Pulse: 75 bpm, Respiratory Rate: 18 breaths/min, Blood Pressure: 134/78 mmHg. Cardiovascular 2+ dorsalis pedis/posterior tibialis pulses. Psychiatric pleasant and cooperative. General Notes: Lesions to her legs bilaterally with granulation tissue present and slight discoloration. No weeping to her legs. No signs of infection. Integumentary (Hair, Skin) Wound #6 status is Open. Original cause of wound  was Blister. The date acquired was: 04/20/2020. The wound has been in treatment 10 weeks. The wound is located on the Right,Anterior Lower Leg. The wound measures 0.7cm length x 0.5cm width x 0.1cm depth; 0.275cm^2 area and 0.027cm^3 volume. There is Fat Layer (Subcutaneous Tissue) exposed. There is no tunneling or undermining noted. There is a medium amount of serosanguineous drainage noted. The wound margin is distinct with the outline attached to the wound base. There is large (67-100%) red, pink granulation within the wound bed. There is no necrotic tissue within the wound bed. Wound #7 status is Open. Original cause of wound was Blister. The date acquired was: 04/20/2020. The wound has been in treatment 10 weeks. The wound is located on the Left,Anterior Lower Leg. The wound measures 1cm length x 0.8cm width x 0.1cm depth; 0.628cm^2 area and  0.063cm^3 volume. There is Fat Layer (Subcutaneous Tissue) exposed. There is no tunneling or undermining noted. There is a medium amount of serosanguineous drainage noted. The wound margin is distinct with the outline attached to the wound base. There is large (67-100%) red, pink granulation within the wound bed. There is no necrotic tissue within the wound bed. Assessment Active Problems ICD-10 Squamous cell carcinoma of skin, unspecified Non-pressure chronic ulcer of other part of right lower leg with unspecified severity Non-pressure chronic ulcer of other part of left lower leg with unspecified severity Venous insufficiency (chronic) (peripheral) Patient tolerated the compression wraps well and is content with this treatment. We will continue these. The squamous cell carcinoma lesions on her legs look greatly improved. I think she is having a very good reaction from the infusions. We will continue silver alginate to this area. No signs of infection on exam. She received her juxta lite compressions in the mail. Plan Follow-up Appointments: Return appointment in 3 weeks. - Dr. Heber Gaylord 09/13/2020 bring in juxtalite compression garments in on this visit. Nurse Visit: - weekly- 08/29/2020, 09/06/2020 Bathing/ Shower/ Hygiene: May shower with protection but do not get wound dressing(s) wet. - Use cast protector Edema Control - Lymphedema / SCD / Other: Elevate legs to the level of the heart or above for 30 minutes daily and/or when sitting, a frequency of: - elevate the legs throughout the day heart level if possible. Avoid standing for long periods of time. Exercise regularly Compression stocking or Garment 30-40 mm/Hg pressure to: - wound center to order juxtalite HD compression garments. Bring in next appt time when you see Dr. Heber Manti. Additional Orders / Instructions: Follow Nutritious Diet Home Health: Discontinue home health for wound care. - wound center to ensure [facility]  has been discharged. WOUND #6: - Lower Leg Wound Laterality: Right, Anterior Cleanser: Soap and Water 1 x Per Week/15 Days Discharge Instructions: May shower and wash wound with dial antibacterial soap and water prior to dressing change. Cleanser: Wound Cleanser 1 x Per Week/15 Days Discharge Instructions: Cleanse the wound with wound cleanser prior to applying a clean dressing using gauze sponges, not tissue or cotton balls. Peri-Wound Care: Triamcinolone 15 (g) 1 x Per Week/15 Days Discharge Instructions: In clinic only. Use triamcinolone 15 (g) in clinic only. Peri-Wound Care: Sween Lotion (Moisturizing lotion) 1 x Per Week/15 Days Discharge Instructions: Apply moisturizing lotion as directed Prim Dressing: KerraCel Ag Gelling Fiber Dressing, 2x2 in (silver alginate) 1 x Per Week/15 Days ary Discharge Instructions: Apply silver alginate to wound bed as instructed Secondary Dressing: Woven Gauze Sponge, Non-Sterile 4x4 in 1 x Per Week/15 Days Discharge Instructions: Apply over  primary dressing as directed. Secondary Dressing: ABD Pad, 8x10 1 x Per Week/15 Days Discharge Instructions: Apply over primary dressing as directed. Com pression Wrap: Kerlix Roll 4.5x3.1 (in/yd) 1 x Per Week/15 Days Discharge Instructions: Apply Kerlix and Coban compression as directed. Com pression Wrap: Coban Self-Adherent Wrap 4x5 (in/yd) 1 x Per Week/15 Days Discharge Instructions: Apply over Kerlix as directed. Com pression Stockings: Circaid Juxta Lite Compression Wrap Compression Amount: 30-40 mmHg (left) Compression Amount: 30-40 mmHg (right) Discharge Instructions: Apply Circaid Juxta Lite Compression Wrap daily as instructed. Apply first thing in the morning, remove at night before bed. WOUND #7: - Lower Leg Wound Laterality: Left, Anterior Cleanser: Soap and Water 1 x Per Week/15 Days Discharge Instructions: May shower and wash wound with dial antibacterial soap and water prior to dressing  change. Cleanser: Wound Cleanser 1 x Per Week/15 Days Discharge Instructions: Cleanse the wound with wound cleanser prior to applying a clean dressing using gauze sponges, not tissue or cotton balls. Peri-Wound Care: Triamcinolone 15 (g) 1 x Per Week/15 Days Discharge Instructions: In clinic only. Use triamcinolone 15 (g) in clinic only. Peri-Wound Care: Sween Lotion (Moisturizing lotion) 1 x Per Week/15 Days Discharge Instructions: Apply moisturizing lotion as directed Prim Dressing: KerraCel Ag Gelling Fiber Dressing, 2x2 in (silver alginate) 1 x Per Week/15 Days ary Discharge Instructions: Apply silver alginate to wound bed as instructed Secondary Dressing: Woven Gauze Sponge, Non-Sterile 4x4 in 1 x Per Week/15 Days Discharge Instructions: Apply over primary dressing as directed. Secondary Dressing: ABD Pad, 8x10 1 x Per Week/15 Days Discharge Instructions: Apply over primary dressing as directed. Com pression Wrap: Kerlix Roll 4.5x3.1 (in/yd) 1 x Per Week/15 Days Discharge Instructions: Apply Kerlix and Coban compression as directed. Com pression Wrap: Coban Self-Adherent Wrap 4x5 (in/yd) 1 x Per Week/15 Days Discharge Instructions: Apply over Kerlix as directed. 1. Continue silver alginate under Kerlix/Coban 2. Nurse visits for the next 2 weeks 3. Follow-up with me in 3 weeks Electronic Signature(s) Signed: 08/23/2020 10:26:10 AM By: Kalman Shan DO Entered By: Kalman Shan on 08/23/2020 10:25:34 -------------------------------------------------------------------------------- HxROS Details Patient Name: Date of Service: [name], [name] 08/23/2020 9:30 A M Medical Record Number: [id number] Patient Account Number: [id number] Date of Birth/Sex: Treating RN: February 28, 1925 (87 y.o. [name] Primary Care Provider: [name] Other Clinician: Referring Provider: Treating Provider/Extender: [name] in Treatment: 10 Information  Obtained From Patient Eyes Medical History: Negative for: Cataracts; Optic Neuritis Ear/Nose/Mouth/Throat Medical History: Negative for: Chronic sinus problems/congestion; Middle ear problems Hematologic/Lymphatic Medical History: Positive for: Anemia Negative for: Hemophilia; Human Immunodeficiency Virus; Lymphedema; Sickle Cell Disease Respiratory Medical History: Negative for: Aspiration; Asthma; Chronic Obstructive Pulmonary Disease (COPD); Pneumothorax; Sleep Apnea; Tuberculosis Cardiovascular Medical History: Positive for: Arrhythmia - Atrial Flutter, A fibb; Congestive Heart Failure; Hypertension Negative for: Angina; Coronary Artery Disease; Deep Vein Thrombosis; Hypotension; Myocardial Infarction; Peripheral Arterial Disease; Peripheral Venous Disease; Phlebitis; Vasculitis Past Medical History Notes: hyperlipidemia Gastrointestinal Medical History: Positive for: Colitis Negative for: Cirrhosis ; Crohns; Hepatitis A; Hepatitis B; Hepatitis C Endocrine Medical History: Negative for: Type I Diabetes; Type II Diabetes Past Medical History Notes: hypothyroidism Genitourinary Medical History: Negative for: End Stage Renal Disease Immunological Medical History: Negative for: Lupus Erythematosus; Raynauds; Scleroderma Integumentary (Skin) Medical History: Negative for: History of Burn Musculoskeletal Medical History: Positive for: Osteoarthritis Negative for: Gout; Rheumatoid Arthritis; Osteomyelitis Neurologic Medical History: Negative for: Dementia; Neuropathy; Quadriplegia; Paraplegia; Seizure Disorder Past Medical History Notes: lumbar spindylolysis Oncologic Medical History: Negative for:  Received Chemotherapy; Received Radiation Past Medical History Notes: BLE squamous ceel carcionoma Immunizations Pneumococcal Vaccine: Received Pneumococcal Vaccination: Yes Received Pneumococcal Vaccination On or After 60th Birthday: No Implantable  Devices None Hospitalization / Surgery History Type of Hospitalization/Surgery removal of rod left leg Family and Social History Unknown History: Yes; Never smoker; Marital Status - Single; Alcohol Use: Never; Drug Use: No History; Caffeine Use: Never; Financial Concerns: No; Food, Clothing or Shelter Needs: No; Support System Lacking: No; Transportation Concerns: No Electronic Signature(s) Signed: 08/23/2020 10:26:10 AM By: Kalman Shan DO Signed: 08/23/2020 6:14:36 PM By: Deon Pilling Entered By: Kalman Shan on 08/23/2020 10:21:26 -------------------------------------------------------------------------------- SuperBill Details Patient Name: Date of Service: [name], [name] 08/23/2020 Medical Record Number: [id number] Patient Account Number: [id number] Date of Birth/Sex: Treating RN: 06/19/1925 (87 y.o. 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 L97.829 Non-pressure chronic ulcer of other part of left lower leg with unspecified severity I87.2 Venous insufficiency (chronic) (peripheral) Facility Procedures CPT4 Code: [id number] Description: [phone] - WOUND CARE VISIT-LEV 5 EST PT Modifier: Quantity: 1 Physician Procedures : CPT4 Code Description Modifier [id number] - WC PHYS LEVEL 3 - EST PT ICD-10 Diagnosis Description C44.92 Squamous cell carcinoma of skin, unspecified L97.819 Non-pressure chronic ulcer of other part of right lower leg with unspecified severity  L97.829 Non-pressure chronic ulcer of other part of left lower leg with unspecified severity I87.2 Venous insufficiency (chronic) (peripheral) Quantity: 1 Electronic  Signature(s) Signed: 08/23/2020 10:26:10 AM By: Kalman Shan DO Entered By: Kalman Shan on 08/23/2020 10:25:48

## 2020-08-24 NOTE — Progress Notes (Signed)
[name] ([id number]) , Visit Report for 08/23/2020 Arrival Information Details Patient Name: Date of Service: [name], [name] 08/23/2020 9:30 A M Medical Record Number: [id number] Patient Account Number: [id number] Date of Birth/Sex: Treating RN: 04-Apr-1925 (87 y.o. [name] Primary Care Mckenzie Bove: [name] Other Clinician: Referring Bonnie Overdorf: Treating Breigh Annett/Extender: [name] in Treatment: 10 Visit Information History Since Last Visit Added or deleted any medications: No Patient Arrived: Walker Any new allergies or adverse reactions: No Arrival Time: 09:31 Had a fall or experienced change in No Transfer Assistance: None activities of daily living that may affect Patient Identification Verified: Yes risk of falls: Secondary Verification Process Completed: Yes Signs or symptoms of abuse/neglect since last visito No Patient Requires Transmission-Based No Hospitalized since last visit: No Precautions: Implantable device outside of the clinic excluding No Patient Has Alerts: Yes cellular tissue based products placed in the center Patient Alerts: R ABI = Non compressible since last visit: L ABI = Non compressible Has Dressing in Place as Prescribed: Yes Has Compression in Place as Prescribed: Yes Pain Present Now: No Electronic Signature(s) Signed: 08/24/2020 1:36:18 PM By: Lorrin Jackson Entered By: Lorrin Jackson on 08/23/2020 09:31:44 -------------------------------------------------------------------------------- Clinic Level of Care Assessment Details Patient Name: Date of Service: [name], [name] 08/23/2020 9:30 A M Medical Record Number: [id number] Patient Account Number: [id number] Date of Birth/Sex: Treating RN: January 19, 1926 (87 y.o. [name], [name] Primary Care Shloimy Michalski: [name] Other Clinician: Referring Lankford Gutzmer: Treating Talmage Teaster/Extender: [name] in  Treatment: 10 Clinic Level of Care Assessment Items TOOL 4 Quantity Score X- 1 0 Use when only an EandM is performed on FOLLOW-UP visit ASSESSMENTS - Nursing Assessment / Reassessment X- 1 10 Reassessment of Co-morbidities (includes updates in patient status) X- 1 5 Reassessment of Adherence to Treatment Plan ASSESSMENTS - Wound and Skin A ssessment / Reassessment '[]'$  - 0 Simple Wound Assessment / Reassessment - one wound X- 2 5 Complex Wound Assessment / Reassessment - multiple wounds X- 1 10 Dermatologic / Skin Assessment (not related to wound area) ASSESSMENTS - Focused Assessment X- 2 5 Circumferential Edema Measurements - multi extremities X- 1 10 Nutritional Assessment / Counseling / Intervention '[]'$  - 0 Lower Extremity Assessment (monofilament, tuning fork, pulses) '[]'$  - 0 Peripheral Arterial Disease Assessment (using hand held doppler) ASSESSMENTS - Ostomy and/or Continence Assessment and Care '[]'$  - 0 Incontinence Assessment and Management '[]'$  - 0 Ostomy Care Assessment and Management (repouching, etc.) PROCESS - Coordination of Care '[]'$  - 0 Simple Patient / Family Education for ongoing care X- 1 20 Complex (extensive) Patient / Family Education for ongoing care X- 1 10 Staff obtains Programmer, systems, Records, T Results / Process Orders est '[]'$  - 0 Staff telephones HHA, Nursing Homes / Clarify orders / etc '[]'$  - 0 Routine Transfer to another Facility (non-emergent condition) '[]'$  - 0 Routine Hospital Admission (non-emergent condition) '[]'$  - 0 New Admissions / Biomedical engineer / Ordering NPWT Apligraf, etc. , '[]'$  - 0 Emergency Hospital Admission (emergent condition) '[]'$  - 0 Simple Discharge Coordination X- 1 15 Complex (extensive) Discharge Coordination PROCESS - Special Needs '[]'$  - 0 Pediatric / Minor Patient Management '[]'$  - 0 Isolation Patient Management '[]'$  - 0 Hearing / Language / Visual special needs '[]'$  - 0 Assessment of Community assistance (transportation,  D/C planning, etc.) '[]'$  - 0 Additional assistance / Altered mentation '[]'$  - 0 Support Surface(s) Assessment (bed, cushion, seat, etc.) INTERVENTIONS - Wound Cleansing / Measurement '[]'$  -  0 Simple Wound Cleansing - one wound X- 2 5 Complex Wound Cleansing - multiple wounds X- 1 5 Wound Imaging (photographs - any number of wounds) '[]'$  - 0 Wound Tracing (instead of photographs) '[]'$  - 0 Simple Wound Measurement - one wound X- 2 5 Complex Wound Measurement - multiple wounds INTERVENTIONS - Wound Dressings '[]'$  - 0 Small Wound Dressing one or multiple wounds '[]'$  - 0 Medium Wound Dressing one or multiple wounds X- 2 20 Large Wound Dressing one or multiple wounds '[]'$  - 0 Application of Medications - topical '[]'$  - 0 Application of Medications - injection INTERVENTIONS - Miscellaneous '[]'$  - 0 External ear exam '[]'$  - 0 Specimen Collection (cultures, biopsies, blood, body fluids, etc.) '[]'$  - 0 Specimen(s) / Culture(s) sent or taken to Lab for analysis '[]'$  - 0 Patient Transfer (multiple staff / Civil Service fast streamer / Similar devices) '[]'$  - 0 Simple Staple / Suture removal (25 or less) '[]'$  - 0 Complex Staple / Suture removal (26 or more) '[]'$  - 0 Hypo / Hyperglycemic Management (close monitor of Blood Glucose) '[]'$  - 0 Ankle / Brachial Index (ABI) - do not check if billed separately X- 1 5 Vital Signs Has the patient been seen at the hospital within the last three years: Yes Total Score: 170 Level Of Care: New/Established - Level 5 Electronic Signature(s) Signed: 08/23/2020 6:14:36 PM By: Deon Pilling Entered By: Deon Pilling on 08/23/2020 09:56:02 -------------------------------------------------------------------------------- Encounter Discharge Information Details Patient Name: Date of Service: [name], [name] 08/23/2020 9:30 A M Medical Record Number: [id number] Patient Account Number: [id number] Date of Birth/Sex: Treating RN: 12-17-1925 (86 y.o. [name] Primary Care Yassen Kinnett:  [name] Other Clinician: Referring Nikolina Simerson: Treating Hussien Greenblatt/Extender: [name] in Treatment: 10 Encounter Discharge Information Items Discharge Condition: Stable Ambulatory Status: Walker Discharge Destination: Home Transportation: Private Auto Accompanied By: alone Schedule Follow-up Appointment: Yes Clinical Summary of Care: Patient Declined Electronic Signature(s) Signed: 08/24/2020 12:35:09 PM By: Levan Hurst RN, BSN Entered By: Levan Hurst on 08/23/2020 10:23:50 -------------------------------------------------------------------------------- Lower Extremity Assessment Details Patient Name: Date of Service: [name], [name] 08/23/2020 9:30 A M Medical Record Number: [id number] Patient Account Number: [id number] Date of Birth/Sex: Treating RN: [birth year] (87 y.o. [name] Primary Care Tierre Gerard: [name] Other Clinician: Referring Jaimen Melone: Treating Destiney Sanabia/Extender: [name] in Treatment: 10 Edema Assessment Assessed: Shirlyn Goltz: Yes] [Right: Yes] Edema: [Left: Yes] [Right: Yes] Calf Left: Right: Point of Measurement: 30 cm From Medial Instep 42 cm 42.5 cm Ankle Left: Right: Point of Measurement: 9 cm From Medial Instep 25 cm 25.5 cm Vascular Assessment Pulses: Dorsalis Pedis Palpable: [Left:Yes] [Right:Yes] Electronic Signature(s) Signed: 08/24/2020 1:36:18 PM By: Lorrin Jackson Entered By: Lorrin Jackson on 08/23/2020 09:40:19 -------------------------------------------------------------------------------- Multi Wound Chart Details Patient Name: Date of Service: [name], [name] 08/23/2020 9:30 A M Medical Record Number: [id number] Patient Account Number: [id number] Date of Birth/Sex: Treating RN: [birth year] (87 y.o. [name], [name] Primary Care Amandeep Nesmith: [name] Other Clinician: Referring Karo Rog: Treating Ralph Brouwer/Extender: [name] in Treatment: 10 Vital Signs Height(in): 65 Pulse(bpm): 30 Weight(lbs): 163 Blood Pressure(mmHg): 134/78 Body Mass Index(BMI): 27 Temperature(F): 97.8 Respiratory Rate(breaths/min): 18 Photos: [N/A:N/A] Right, Anterior Lower Leg Left, Anterior Lower Leg N/A Wound Location: Blister Blister N/A Wounding Event: Malignant Wound Malignant Wound N/A Primary Etiology: Anemia, Arrhythmia, Congestive Heart Anemia, Arrhythmia, Congestive Heart N/A Comorbid History: Failure, Hypertension, Colitis, Failure, Hypertension, Colitis, Osteoarthritis Osteoarthritis 04/20/2020 04/20/2020 N/A Date  Acquired: 10 10 N/A Weeks of Treatment: Open Open N/A Wound Status: 0.7x0.5x0.1 1x0.8x0.1 N/A Measurements L x W x D (cm) 0.275 0.628 N/A A (cm) : rea 0.027 0.063 N/A Volume (cm) : 95.70% 79.60% N/A % Reduction in Area: 98.60% 79.50% N/A % Reduction in Volume: Full Thickness Without Exposed Full Thickness Without Exposed N/A Classification: Support Structures Support Structures Medium Medium N/A Exudate Amount: Serosanguineous Serosanguineous N/A Exudate Type: red, brown red, brown N/A Exudate Color: Distinct, outline attached Distinct, outline attached N/A Wound Margin: Large (67-100%) Large (67-100%) N/A Granulation Amount: Red, Pink Red, Pink N/A Granulation Quality: None Present (0%) None Present (0%) N/A Necrotic Amount: Fat Layer (Subcutaneous Tissue): Yes Fat Layer (Subcutaneous Tissue): Yes N/A Exposed Structures: Fascia: No Fascia: No Tendon: No Tendon: No Muscle: No Muscle: No Joint: No Joint: No Bone: No Bone: No Small (1-33%) Small (1-33%) N/A Epithelialization: Treatment Notes Electronic Signature(s) Signed: 08/23/2020 10:26:10 AM By: Kalman Shan DO Signed: 08/23/2020 6:14:36 PM By: Deon Pilling Entered By: Kalman Shan on 08/23/2020  10:20:10 -------------------------------------------------------------------------------- Multi-Disciplinary Care Plan Details Patient Name: Date of Service: [name], [name] 08/23/2020 9:30 A M Medical Record Number: [id number] Patient Account Number: [id number] Date of Birth/Sex: Treating RN: October 23, 1925 (87 y.o. [name], [name] Primary Care Yvaine Jankowiak: [name] Other Clinician: Referring Weslie Pretlow: Treating Shaketta Rill/Extender: [name] in Treatment: [place] Number Two reviewed with physician Active Inactive Wound/Skin Impairment Nursing Diagnoses: Impaired tissue integrity Knowledge deficit related to ulceration/compromised skin integrity Goals: Patient/caregiver will verbalize understanding of skin care regimen Date Initiated: 06/11/2020 Target Resolution Date: 09/21/2020 Goal Status: Active Interventions: Assess patient/caregiver ability to obtain necessary supplies Assess patient/caregiver ability to perform ulcer/skin care regimen upon admission and as needed Assess ulceration(s) every visit Provide education on ulcer and skin care Notes: Electronic Signature(s) Signed: 08/23/2020 6:14:36 PM By: Deon Pilling Entered By: Deon Pilling on 08/23/2020 09:35:20 -------------------------------------------------------------------------------- Pain Assessment Details Patient Name: Date of Service: [name], [name] 08/23/2020 9:30 A M Medical Record Number: [id number] Patient Account Number: [id number] Date of Birth/Sex: Treating RN: 1925/11/09 (87 y.o. [name] Primary Care Aharon Carriere: [name] Other Clinician: Referring Jemia Fata: Treating Evalene Vath/Extender: [name] in Treatment: 10 Active Problems Location of Pain Severity and Description of Pain Patient Has Paino No Site Locations Pain Management and Medication Current Pain Management: Electronic  Signature(s) Signed: 08/24/2020 1:36:18 PM By: Lorrin Jackson Entered By: Lorrin Jackson on 08/23/2020 09:31:56 -------------------------------------------------------------------------------- Patient/Caregiver Education Details Patient Name: Date of Service: [name], Cherylanne A. 8/4/2022andnbsp9:30 A M Medical Record Number: [id number] Patient Account Number: [id number] Date of Birth/Gender: Treating RN: 1925-08-01 (87 y.o. [name] Primary Care Physician: [name] Other Clinician: Referring Physician: Treating Physician/Extender: [name] in Treatment: 10 Education Assessment Education Provided To: Patient Education Topics Provided Wound/Skin Impairment: Handouts: Skin Care Do's and Dont's Methods: Explain/Verbal Responses: Reinforcements needed Electronic Signature(s) Signed: 08/23/2020 6:14:36 PM By: Deon Pilling Entered By: Deon Pilling on 08/23/2020 09:38:02 -------------------------------------------------------------------------------- Wound Assessment Details Patient Name: Date of Service: [name], [name] 08/23/2020 9:30 A M Medical Record Number: [id number] Patient Account Number: [id number] Date of Birth/Sex: Treating RN: 1925-05-25 (87 y.o. [name] Primary Care Yazmeen Woolf: [name] Other Clinician: Referring Pietrina Jagodzinski: Treating Chrisma Hurlock/Extender: [name] in Treatment: 10 Wound Status Wound Number: 6 Primary Malignant Wound Etiology: Wound Location: Right, Anterior Lower Leg Wound Open Wounding Event: Blister Status: Date Acquired: 04/20/2020 Comorbid Anemia,  Arrhythmia, Congestive Heart Failure, Hypertension, Weeks Of Treatment: 10 History: Colitis, Osteoarthritis Clustered Wound: No Photos Wound Measurements Length: (cm) 0.7 Width: (cm) 0.5 Depth: (cm) 0.1 Area: (cm) 0.275 Volume: (cm) 0.027 % Reduction in Area: 95.7% % Reduction in Volume:  98.6% Epithelialization: Small (1-33%) Tunneling: No Undermining: No Wound Description Classification: Full Thickness Without Exposed Support Structures Wound Margin: Distinct, outline attached Exudate Amount: Medium Exudate Type: Serosanguineous Exudate Color: red, brown Foul Odor After Cleansing: No Slough/Fibrino No Wound Bed Granulation Amount: Large (67-100%) Exposed Structure Granulation Quality: Red, Pink Fascia Exposed: No Necrotic Amount: None Present (0%) Fat Layer (Subcutaneous Tissue) Exposed: Yes Tendon Exposed: No Muscle Exposed: No Joint Exposed: No Bone Exposed: No Treatment Notes Wound #6 (Lower Leg) Wound Laterality: Right, Anterior Cleanser Soap and Water Discharge Instruction: May shower and wash wound with dial antibacterial soap and water prior to dressing change. Wound Cleanser Discharge Instruction: Cleanse the wound with wound cleanser prior to applying a clean dressing using gauze sponges, not tissue or cotton balls. Peri-Wound Care Triamcinolone 15 (g) Discharge Instruction: In clinic only. Use triamcinolone 15 (g) in clinic only. Sween Lotion (Moisturizing lotion) Discharge Instruction: Apply moisturizing lotion as directed Topical Primary Dressing KerraCel Ag Gelling Fiber Dressing, 2x2 in (silver alginate) Discharge Instruction: Apply silver alginate to wound bed as instructed Secondary Dressing Woven Gauze Sponge, Non-Sterile 4x4 in Discharge Instruction: Apply over primary dressing as directed. ABD Pad, 8x10 Discharge Instruction: Apply over primary dressing as directed. Secured With Compression Wrap Kerlix Roll 4.5x3.1 (in/yd) Discharge Instruction: Apply Kerlix and Coban compression as directed. Coban Self-Adherent Wrap 4x5 (in/yd) Discharge Instruction: Apply over Kerlix as directed. Compression Stockings Circaid Juxta Lite Compression Wrap Quantity: 1 Left Leg Compression Amount: 30-40 mmHg Right Leg Compression Amount: 30-40  mmHg Discharge Instruction: Apply Circaid Juxta Lite Compression Wrap daily as instructed. Apply first thing in the morning, remove at night before bed. Add-Ons Electronic Signature(s) Signed: 08/24/2020 1:36:18 PM By: Lorrin Jackson Entered By: Lorrin Jackson on 08/23/2020 09:42:34 -------------------------------------------------------------------------------- Wound Assessment Details Patient Name: Date of Service: [name], [name] 08/23/2020 9:30 A M Medical Record Number: [id number] Patient Account Number: [id number] Date of Birth/Sex: Treating RN: 1925-10-02 (87 y.o. [name] Primary Care Japheth Diekman: [name] Other Clinician: Referring Montez Cuda: Treating Faryal Marxen/Extender: [name] in Treatment: 10 Wound Status Wound Number: 7 Primary Malignant Wound Etiology: Wound Location: Left, Anterior Lower Leg Wound Open Wounding Event: Blister Status: Date Acquired: 04/20/2020 Comorbid Anemia, Arrhythmia, Congestive Heart Failure, Hypertension, Weeks Of Treatment: 10 History: Colitis, Osteoarthritis Clustered Wound: No Photos Wound Measurements Length: (cm) 1 Width: (cm) 0.8 Depth: (cm) 0.1 Area: (cm) 0.628 Volume: (cm) 0.063 % Reduction in Area: 79.6% % Reduction in Volume: 79.5% Epithelialization: Small (1-33%) Tunneling: No Undermining: No Wound Description Classification: Full Thickness Without Exposed Support Structures Wound Margin: Distinct, outline attached Exudate Amount: Medium Exudate Type: Serosanguineous Exudate Color: red, brown Foul Odor After Cleansing: No Slough/Fibrino No Wound Bed Granulation Amount: Large (67-100%) Exposed Structure Granulation Quality: Red, Pink Fascia Exposed: No Necrotic Amount: None Present (0%) Fat Layer (Subcutaneous Tissue) Exposed: Yes Tendon Exposed: No Muscle Exposed: No Joint Exposed: No Bone Exposed: No Treatment Notes Wound #7 (Lower Leg) Wound Laterality:  Left, Anterior Cleanser Soap and Water Discharge Instruction: May shower and wash wound with dial antibacterial soap and water prior to dressing change. Wound Cleanser Discharge Instruction: Cleanse the wound with wound cleanser prior to applying a clean dressing using gauze sponges, not tissue or cotton balls.  Peri-Wound Care Triamcinolone 15 (g) Discharge Instruction: In clinic only. Use triamcinolone 15 (g) in clinic only. Sween Lotion (Moisturizing lotion) Discharge Instruction: Apply moisturizing lotion as directed Topical Primary Dressing KerraCel Ag Gelling Fiber Dressing, 2x2 in (silver alginate) Discharge Instruction: Apply silver alginate to wound bed as instructed Secondary Dressing Woven Gauze Sponge, Non-Sterile 4x4 in Discharge Instruction: Apply over primary dressing as directed. ABD Pad, 8x10 Discharge Instruction: Apply over primary dressing as directed. Secured With Compression Wrap Kerlix Roll 4.5x3.1 (in/yd) Discharge Instruction: Apply Kerlix and Coban compression as directed. Coban Self-Adherent Wrap 4x5 (in/yd) Discharge Instruction: Apply over Kerlix as directed. Compression Stockings Add-Ons Electronic Signature(s) Signed: 08/24/2020 1:36:18 PM By: Lorrin Jackson Entered By: Lorrin Jackson on 08/23/2020 09:43:56 -------------------------------------------------------------------------------- Vitals Details Patient Name: Date of Service: [name], [name] 08/23/2020 9:30 A M Medical Record Number: [id number] Patient Account Number: [id number] Date of Birth/Sex: Treating RN: [birth year] (87 y.o. [name] Primary Care Kacper Cartlidge: [name] Other Clinician: Referring Nahia Nissan: Treating Sidhant Helderman/Extender: [name] in Treatment: 10 Vital Signs Time Taken: 09:32 Temperature (F): 97.8 Height (in): 65 Pulse (bpm): 75 Weight (lbs): 163 Respiratory Rate (breaths/min): 18 Body Mass Index (BMI):  27.1 Blood Pressure (mmHg): 134/78 Reference Range: 80 - 120 mg / dl Electronic Signature(s) Signed: 08/24/2020 1:36:18 PM By: Lorrin Jackson Entered By: Lorrin Jackson on 08/23/2020 09:34:15

## 2020-08-28 ENCOUNTER — Other Ambulatory Visit: Payer: Self-pay

## 2020-08-28 ENCOUNTER — Encounter (HOSPITAL_BASED_OUTPATIENT_CLINIC_OR_DEPARTMENT_OTHER): Payer: Medicare Other | Admitting: Physician Assistant

## 2020-08-28 DIAGNOSIS — I872 Venous insufficiency (chronic) (peripheral): Secondary | ICD-10-CM | POA: Diagnosis not present

## 2020-08-28 DIAGNOSIS — L97819 Non-pressure chronic ulcer of other part of right lower leg with unspecified severity: Secondary | ICD-10-CM | POA: Diagnosis not present

## 2020-08-28 DIAGNOSIS — C44729 Squamous cell carcinoma of skin of left lower limb, including hip: Secondary | ICD-10-CM | POA: Diagnosis not present

## 2020-08-28 DIAGNOSIS — C44722 Squamous cell carcinoma of skin of right lower limb, including hip: Secondary | ICD-10-CM | POA: Diagnosis not present

## 2020-08-28 DIAGNOSIS — L97829 Non-pressure chronic ulcer of other part of left lower leg with unspecified severity: Secondary | ICD-10-CM | POA: Diagnosis not present

## 2020-08-30 NOTE — Progress Notes (Signed)
[name] ([id number]) , Visit Report for 08/28/2020 SuperBill Details Patient Name: Date of Service: [name], [name] 08/28/2020 Medical Record Number: [id number] Patient Account Number: [id number] Date of Birth/Sex: Treating RN: 1925/07/04 (87 y.o. [name], [name] Primary Care Provider: [name] Other Clinician: Referring Provider: Treating Provider/Extender: Charlestine Massed in Treatment: 11 Diagnosis Coding ICD-10 Codes Code Description C44.92 Squamous cell carcinoma of skin, unspecified L97.819 Non-pressure chronic ulcer of other part of right lower leg with unspecified severity L97.829 Non-pressure chronic ulcer of other part of left lower leg with unspecified severity I87.2 Venous insufficiency (chronic) (peripheral) Facility Procedures CPT4 Code Description Modifier Quantity [id number] - WOUND CARE VISIT-LEV 4 EST PT 1 Electronic Signature(s) Signed: 08/28/2020 5:41:18 PM By: Deon Pilling Signed: 08/30/2020 5:47:57 PM By: Worthy Keeler PA-C Entered By: Deon Pilling on 08/28/2020 13:31:57

## 2020-09-04 NOTE — Progress Notes (Signed)
[facility] ([id number]) , Visit Report for 08/28/2020 Arrival Information Details Patient Name: Date of Service: [name], [name] 08/28/2020 1:15 PM Medical Record Number: [id number] Patient Account Number: [id number] Date of Birth/Sex: Treating RN: Mar 17, 1925 (87 y.o. [name], [name] Primary Care Kahlin Mark: [name] Other Clinician: Referring [name]: Treating [name]/Extender: [name] in Treatment: 11 Visit Information History Since Last Visit Added or deleted any medications: No Patient Arrived: Walker Any new allergies or adverse reactions: No Arrival Time: 13:14 Had a fall or experienced change in No Accompanied By: self activities of daily living that may affect Transfer Assistance: None risk of falls: Patient Identification Verified: Yes Signs or symptoms of abuse/neglect since last visito No Secondary Verification Process Completed: Yes Hospitalized since last visit: No Patient Requires Transmission-Based No Implantable device outside of the clinic excluding No Precautions: cellular tissue based products placed in the center Patient Has Alerts: Yes since last visit: Patient Alerts: R ABI = Non compressible Has Dressing in Place as Prescribed: Yes L ABI = Non Pain Present Now: No compressible Electronic Signature(s) Signed: 09/04/2020 11:33:27 AM By: Sandre Kitty Entered By: Sandre Kitty on 08/28/2020 13:14:41 -------------------------------------------------------------------------------- Clinic Level of Care Assessment Details Patient Name: Date of Service: [name], [name] 08/28/2020 1:15 PM Medical Record Number: [id number] Patient Account Number: [id number] Date of Birth/Sex: Treating RN: [birth year] (87 y.o. [name], [name] Primary Care [name]: [name] Other Clinician: Referring [name]: Treating [name]/Extender: [name] in Treatment: 11 Clinic  Level of Care Assessment Items TOOL 4 Quantity Score X- 1 0 Use when only an EandM is performed on FOLLOW-UP visit ASSESSMENTS - Nursing Assessment / Reassessment X- 1 10 Reassessment of Co-morbidities (includes updates in patient status) X- 1 5 Reassessment of Adherence to Treatment Plan ASSESSMENTS - Wound and Skin A ssessment / Reassessment '[]'$  - 0 Simple Wound Assessment / Reassessment - one wound X- 2 5 Complex Wound Assessment / Reassessment - multiple wounds X- 1 10 Dermatologic / Skin Assessment (not related to wound area) ASSESSMENTS - Focused Assessment X- 2 5 Circumferential Edema Measurements - multi extremities X- 1 10 Nutritional Assessment / Counseling / Intervention '[]'$  - 0 Lower Extremity Assessment (monofilament, tuning fork, pulses) '[]'$  - 0 Peripheral Arterial Disease Assessment (using hand held doppler) ASSESSMENTS - Ostomy and/or Continence Assessment and Care '[]'$  - 0 Incontinence Assessment and Management '[]'$  - 0 Ostomy Care Assessment and Management (repouching, etc.) PROCESS - Coordination of Care '[]'$  - 0 Simple Patient / Family Education for ongoing care X- 1 20 Complex (extensive) Patient / Family Education for ongoing care X- 1 10 Staff obtains Programmer, systems, Records, T Results / Process Orders est '[]'$  - 0 Staff telephones HHA, Nursing Homes / Clarify orders / etc '[]'$  - 0 Routine Transfer to another Facility (non-emergent condition) '[]'$  - 0 Routine Hospital Admission (non-emergent condition) '[]'$  - 0 New Admissions / Biomedical engineer / Ordering NPWT Apligraf, etc. , '[]'$  - 0 Emergency Hospital Admission (emergent condition) '[]'$  - 0 Simple Discharge Coordination X- 1 15 Complex (extensive) Discharge Coordination PROCESS - Special Needs '[]'$  - 0 Pediatric / Minor Patient Management '[]'$  - 0 Isolation Patient Management '[]'$  - 0 Hearing / Language / Visual special needs '[]'$  - 0 Assessment of Community assistance (transportation, D/C planning,  etc.) '[]'$  - 0 Additional assistance / Altered mentation '[]'$  - 0 Support Surface(s) Assessment (bed, cushion, seat, etc.) INTERVENTIONS - Wound Cleansing / Measurement '[]'$  - 0 Simple  Wound Cleansing - one wound X- 2 5 Complex Wound Cleansing - multiple wounds '[]'$  - 0 Wound Imaging (photographs - any number of wounds) '[]'$  - 0 Wound Tracing (instead of photographs) '[]'$  - 0 Simple Wound Measurement - one wound '[]'$  - 0 Complex Wound Measurement - multiple wounds INTERVENTIONS - Wound Dressings '[]'$  - 0 Small Wound Dressing one or multiple wounds '[]'$  - 0 Medium Wound Dressing one or multiple wounds X- 2 20 Large Wound Dressing one or multiple wounds '[]'$  - 0 Application of Medications - topical '[]'$  - 0 Application of Medications - injection INTERVENTIONS - Miscellaneous '[]'$  - 0 External ear exam '[]'$  - 0 Specimen Collection (cultures, biopsies, blood, body fluids, etc.) '[]'$  - 0 Specimen(s) / Culture(s) sent or taken to Lab for analysis '[]'$  - 0 Patient Transfer (multiple staff / Civil Service fast streamer / Similar devices) '[]'$  - 0 Simple Staple / Suture removal (25 or less) '[]'$  - 0 Complex Staple / Suture removal (26 or more) '[]'$  - 0 Hypo / Hyperglycemic Management (close monitor of Blood Glucose) '[]'$  - 0 Ankle / Brachial Index (ABI) - do not check if billed separately X- 1 5 Vital Signs Has the patient been seen at the hospital within the last three years: Yes Total Score: 155 Level Of Care: New/Established - Level 4 Electronic Signature(s) Signed: 08/28/2020 5:41:18 PM By: Deon Pilling Entered By: Deon Pilling on 08/28/2020 13:31:51 -------------------------------------------------------------------------------- Encounter Discharge Information Details Patient Name: Date of Service: [name], [name] 08/28/2020 1:15 PM Medical Record Number: [id number] Patient Account Number: [id number] Date of Birth/Sex: Treating RN: [birth year] (87 y.o. [name] Primary Care [name]: [name]  Other Clinician: Referring [name]: Treating [name]/Extender: [name], [name] in Treatment: 11 Encounter Discharge Information Items Discharge Condition: Stable Ambulatory Status: Walker Discharge Destination: Home Transportation: Private Auto Accompanied By: self Schedule Follow-up Appointment: Yes Clinical Summary of Care: Electronic Signature(s) Signed: 08/28/2020 5:41:18 PM By: Deon Pilling Entered By: Deon Pilling on 08/28/2020 13:31:18 -------------------------------------------------------------------------------- Patient/Caregiver Education Details Patient Name: Date of Service: [name], Daryn A. 8/9/2022andnbsp1:15 PM Medical Record Number: [id number] Patient Account Number: [id number] Date of Birth/Gender: Treating RN: 01/30/1925 (87 y.o. [name] Primary Care Physician: [name] Other Clinician: Referring Physician: Treating Physician/Extender: [name], [name] in Treatment: 11 Education Assessment Education Provided To: Patient Education Topics Provided Wound/Skin Impairment: Handouts: Skin Care Do's and Dont's Methods: Explain/Verbal Responses: Reinforcements needed Electronic Signature(s) Signed: 08/28/2020 5:41:18 PM By: Deon Pilling Entered By: Deon Pilling on 08/28/2020 13:31:06 -------------------------------------------------------------------------------- Wound Assessment Details Patient Name: Date of Service: [name], [name] 08/28/2020 1:15 PM Medical Record Number: [id number] Patient Account Number: [id number] Date of Birth/Sex: Treating RN: 11-13-1925 (87 y.o. [name], [name] Primary Care Trayden Brandy: [name] Other Clinician: Referring Leo Weyandt: Treating Charmian Forbis/Extender: [name] in Treatment: 11 Wound Status Wound Number: 6 Primary Etiology: Malignant Wound Wound Location: Right, Anterior Lower Leg Wound Status:  Open Wounding Event: Blister Date Acquired: 04/20/2020 Weeks Of Treatment: 11 Clustered Wound: No Wound Measurements Length: (cm) 0.7 Width: (cm) 0.5 Depth: (cm) 0.1 Area: (cm) 0.275 Volume: (cm) 0.027 % Reduction in Area: 95.7% % Reduction in Volume: 98.6% Wound Description Classification: Full Thickness Without Exposed Support Structu Exudate Amount: Medium Exudate Type: Serosanguineous Exudate Color: red, brown res Treatment Notes Wound #6 (Lower Leg) Wound Laterality: Right, Anterior Cleanser Soap and Water Discharge Instruction: May shower and wash wound with dial antibacterial soap and water  prior to dressing change. Wound Cleanser Discharge Instruction: Cleanse the wound with wound cleanser prior to applying a clean dressing using gauze sponges, not tissue or cotton balls. Peri-Wound Care Triamcinolone 15 (g) Discharge Instruction: In clinic only. Use triamcinolone 15 (g) in clinic only. Sween Lotion (Moisturizing lotion) Discharge Instruction: Apply moisturizing lotion as directed Topical Primary Dressing KerraCel Ag Gelling Fiber Dressing, 2x2 in (silver alginate) Discharge Instruction: Apply silver alginate to wound bed as instructed Secondary Dressing Woven Gauze Sponge, Non-Sterile 4x4 in Discharge Instruction: Apply over primary dressing as directed. ABD Pad, 8x10 Discharge Instruction: Apply over primary dressing as directed. Secured With Compression Wrap Kerlix Roll 4.5x3.1 (in/yd) Discharge Instruction: Apply Kerlix and Coban compression as directed. Coban Self-Adherent Wrap 4x5 (in/yd) Discharge Instruction: Apply over Kerlix as directed. Compression Stockings Circaid Juxta Lite Compression Wrap Quantity: 1 Left Leg Compression Amount: 30-40 mmHg Right Leg Compression Amount: 30-40 mmHg Discharge Instruction: Apply Circaid Juxta Lite Compression Wrap daily as instructed. Apply first thing in the morning, remove at night  before bed. Add-Ons Electronic Signature(s) Signed: 08/28/2020 5:35:30 PM By: Rhae Hammock RN Signed: 09/04/2020 11:33:27 AM By: Sandre Kitty Entered By: Sandre Kitty on 08/28/2020 13:15:12 -------------------------------------------------------------------------------- Wound Assessment Details Patient Name: Date of Service: [name], [name] 08/28/2020 1:15 PM Medical Record Number: [id number] Patient Account Number: [id number] Date of Birth/Sex: Treating RN: [birth year] (87 y.o. [name], [name] Primary Care Tawney Vanorman: [name] Other Clinician: Referring Revel Stellmach: Treating Timur Nibert/Extender: [name] in Treatment: 11 Wound Status Wound Number: 7 Primary Etiology: Malignant Wound Wound Location: Left, Anterior Lower Leg Wound Status: Open Wounding Event: Blister Date Acquired: 04/20/2020 Weeks Of Treatment: 11 Clustered Wound: No Wound Measurements Length: (cm) 1 Width: (cm) 0.8 Depth: (cm) 0.1 Area: (cm) 0.628 Volume: (cm) 0.063 % Reduction in Area: 79.6% % Reduction in Volume: 79.5% Wound Description Classification: Full Thickness Without Exposed Support Structu Exudate Amount: Medium Exudate Type: Serosanguineous Exudate Color: red, brown res Treatment Notes Wound #7 (Lower Leg) Wound Laterality: Left, Anterior Cleanser Soap and Water Discharge Instruction: May shower and wash wound with dial antibacterial soap and water prior to dressing change. Wound Cleanser Discharge Instruction: Cleanse the wound with wound cleanser prior to applying a clean dressing using gauze sponges, not tissue or cotton balls. Peri-Wound Care Triamcinolone 15 (g) Discharge Instruction: In clinic only. Use triamcinolone 15 (g) in clinic only. Sween Lotion (Moisturizing lotion) Discharge Instruction: Apply moisturizing lotion as directed Topical Primary Dressing KerraCel Ag Gelling Fiber Dressing, 2x2 in (silver  alginate) Discharge Instruction: Apply silver alginate to wound bed as instructed Secondary Dressing Woven Gauze Sponge, Non-Sterile 4x4 in Discharge Instruction: Apply over primary dressing as directed. ABD Pad, 8x10 Discharge Instruction: Apply over primary dressing as directed. Secured With Compression Wrap Kerlix Roll 4.5x3.1 (in/yd) Discharge Instruction: Apply Kerlix and Coban compression as directed. Coban Self-Adherent Wrap 4x5 (in/yd) Discharge Instruction: Apply over Kerlix as directed. Compression Stockings Add-Ons Electronic Signature(s) Signed: 08/28/2020 5:35:30 PM By: Rhae Hammock RN Signed: 09/04/2020 11:33:27 AM By: Sandre Kitty Entered By: Sandre Kitty on 08/28/2020 13:15:12 -------------------------------------------------------------------------------- Vitals Details Patient Name: Date of Service: [name], [name] 08/28/2020 1:15 PM Medical Record Number: [id number] Patient Account Number: [id number] Date of Birth/Sex: Treating RN: 05/01/1925 (87 y.o. [name] Primary Care Batool Majid: [name] Other Clinician: Referring Iker Nuttall: Treating Reginaldo Hazard/Extender: [name] in Treatment: 11 Vital Signs Time Taken: 13:14 Temperature (F): 98.7 Height (in): 65 Pulse (bpm): 87 Weight (lbs): 163  Respiratory Rate (breaths/min): 18 Body Mass Index (BMI): 27.1 Blood Pressure (mmHg): 128/72 Reference Range: 80 - 120 mg / dl Electronic Signature(s) Signed: 09/04/2020 11:33:27 AM By: Sandre Kitty Entered By: Sandre Kitty on 08/28/2020 13:14:58

## 2020-09-05 ENCOUNTER — Telehealth: Payer: Self-pay | Admitting: *Deleted

## 2020-09-05 ENCOUNTER — Other Ambulatory Visit: Payer: Self-pay

## 2020-09-05 ENCOUNTER — Ambulatory Visit: Payer: Medicare Other

## 2020-09-05 ENCOUNTER — Inpatient Hospital Stay: Payer: Medicare Other

## 2020-09-05 ENCOUNTER — Ambulatory Visit: Payer: Medicare Other | Admitting: Oncology

## 2020-09-05 ENCOUNTER — Inpatient Hospital Stay: Payer: Medicare Other | Attending: Oncology

## 2020-09-05 VITALS — BP 144/77 | HR 77 | Temp 98.2°F | Resp 18 | Wt 153.0 lb

## 2020-09-05 DIAGNOSIS — C44711 Basal cell carcinoma of skin of unspecified lower limb, including hip: Secondary | ICD-10-CM | POA: Diagnosis not present

## 2020-09-05 DIAGNOSIS — C44721 Squamous cell carcinoma of skin of unspecified lower limb, including hip: Secondary | ICD-10-CM

## 2020-09-05 DIAGNOSIS — C50412 Malignant neoplasm of upper-outer quadrant of left female breast: Secondary | ICD-10-CM

## 2020-09-05 DIAGNOSIS — Z79899 Other long term (current) drug therapy: Secondary | ICD-10-CM | POA: Diagnosis not present

## 2020-09-05 DIAGNOSIS — Z5112 Encounter for antineoplastic immunotherapy: Secondary | ICD-10-CM | POA: Insufficient documentation

## 2020-09-05 DIAGNOSIS — C44712 Basal cell carcinoma of skin of right lower limb, including hip: Secondary | ICD-10-CM | POA: Diagnosis not present

## 2020-09-05 DIAGNOSIS — Z170 Estrogen receptor positive status [ER+]: Secondary | ICD-10-CM

## 2020-09-05 DIAGNOSIS — C50912 Malignant neoplasm of unspecified site of left female breast: Secondary | ICD-10-CM

## 2020-09-05 DIAGNOSIS — C4490 Unspecified malignant neoplasm of skin, unspecified: Secondary | ICD-10-CM

## 2020-09-05 DIAGNOSIS — E064 Drug-induced thyroiditis: Secondary | ICD-10-CM

## 2020-09-05 LAB — COMPREHENSIVE METABOLIC PANEL
ALT: 20 U/L (ref 0–44)
AST: 23 U/L (ref 15–41)
Albumin: 3.5 g/dL (ref 3.5–5.0)
Alkaline Phosphatase: 70 U/L (ref 38–126)
Anion gap: 12 (ref 5–15)
BUN: 21 mg/dL (ref 8–23)
CO2: 26 mmol/L (ref 22–32)
Calcium: 9.8 mg/dL (ref 8.9–10.3)
Chloride: 104 mmol/L (ref 98–111)
Creatinine, Ser: 1.25 mg/dL — ABNORMAL HIGH (ref 0.44–1.00)
GFR, Estimated: 40 mL/min — ABNORMAL LOW (ref >60)
Glucose, Bld: 118 mg/dL — ABNORMAL HIGH (ref 70–99)
Potassium: 3.8 mmol/L (ref 3.5–5.1)
Sodium: 142 mmol/L (ref 135–145)
Total Bilirubin: 0.9 mg/dL (ref 0.3–1.2)
Total Protein: 7.4 g/dL (ref 6.5–8.1)

## 2020-09-05 LAB — CBC WITH DIFFERENTIAL/PLATELET
Abs Immature Granulocytes: 0.01 10*3/uL (ref 0.00–0.07)
Basophils Absolute: 0 10*3/uL (ref 0.0–0.1)
Basophils Relative: 0 %
Eosinophils Absolute: 0.2 10*3/uL (ref 0.0–0.5)
Eosinophils Relative: 3 %
HCT: 36.2 % (ref 36.0–46.0)
Hemoglobin: 12 g/dL (ref 12.0–15.0)
Immature Granulocytes: 0 %
Lymphocytes Relative: 26 %
Lymphs Abs: 1.2 10*3/uL (ref 0.7–4.0)
MCH: 35 pg — ABNORMAL HIGH (ref 26.0–34.0)
MCHC: 33.1 g/dL (ref 30.0–36.0)
MCV: 105.5 fL — ABNORMAL HIGH (ref 80.0–100.0)
Monocytes Absolute: 0.6 10*3/uL (ref 0.1–1.0)
Monocytes Relative: 13 %
Neutro Abs: 2.7 10*3/uL (ref 1.7–7.7)
Neutrophils Relative %: 58 %
Platelets: 204 10*3/uL (ref 150–400)
RBC: 3.43 MIL/uL — ABNORMAL LOW (ref 3.87–5.11)
RDW: 13.6 % (ref 11.5–15.5)
WBC: 4.7 10*3/uL (ref 4.0–10.5)
nRBC: 0 % (ref 0.0–0.2)

## 2020-09-05 LAB — TSH: TSH: 0.829 u[IU]/mL (ref 0.308–3.960)

## 2020-09-05 MED ORDER — SODIUM CHLORIDE 0.9 % IV SOLN
350.0000 mg | Freq: Once | INTRAVENOUS | Status: AC
  Administered 2020-09-05: 350 mg via INTRAVENOUS
  Filled 2020-09-05: qty 7

## 2020-09-05 MED ORDER — SODIUM CHLORIDE 0.9 % IV SOLN: Freq: Once | INTRAVENOUS | Status: AC

## 2020-09-05 NOTE — Patient Instructions (Signed)
[facility]  Discharge Instructions: Thank you for choosing [facility] to provide your oncology and hematology care.   If you have a lab appointment with the Cancer Center, please go directly to the Cancer Center and check in at the registration area.   Wear comfortable clothing and clothing appropriate for easy access to any Portacath or PICC line.   We strive to give you quality time with your provider. You may need to reschedule your appointment if you arrive late (15 or more minutes).  Arriving late affects you and other patients whose appointments are after yours.  Also, if you miss three or more appointments without notifying the office, you may be dismissed from the clinic at the provider's discretion.      For prescription refill requests, have your pharmacy contact our office and allow 72 hours for refills to be completed.    Today you received the following chemotherapy and/or immunotherapy agents Cemiplimab-rwlc (Libtayo)      To help prevent nausea and vomiting after your treatment, we encourage you to take your nausea medication as directed.  BELOW ARE SYMPTOMS THAT SHOULD BE REPORTED IMMEDIATELY: *FEVER GREATER THAN 100.4 F (38 C) OR HIGHER *CHILLS OR SWEATING *NAUSEA AND VOMITING THAT IS NOT CONTROLLED WITH YOUR NAUSEA MEDICATION *UNUSUAL SHORTNESS OF BREATH *UNUSUAL BRUISING OR BLEEDING *URINARY PROBLEMS (pain or burning when urinating, or frequent urination) *BOWEL PROBLEMS (unusual diarrhea, constipation, pain near the anus) TENDERNESS IN MOUTH AND THROAT WITH OR WITHOUT PRESENCE OF ULCERS (sore throat, sores in mouth, or a toothache) UNUSUAL RASH, SWELLING OR PAIN  UNUSUAL VAGINAL DISCHARGE OR ITCHING   Items with * indicate a potential emergency and should be followed up as soon as possible or go to the Emergency Department if any problems should occur.  Please show the CHEMOTHERAPY ALERT CARD or IMMUNOTHERAPY ALERT CARD  at check-in to the Emergency Department and triage nurse.  Should you have questions after your visit or need to cancel or reschedule your appointment, please contact [facility]  Dept: [phone]  and follow the prompts.  Office hours are 8:00 a.m. to 4:30 p.m. Monday - Friday. Please note that voicemails left after 4:00 p.m. may not be returned until the following business day.  We are closed weekends and major holidays. You have access to a nurse at all times for urgent questions. Please call the main number to the clinic Dept: [phone] and follow the prompts.   For any non-urgent questions, you may also contact your provider using MyChart. We now offer e-Visits for anyone 18 and older to request care online for non-urgent symptoms. For details visit [email or web address].   Also download the MyChart app! Go to the app store, search "MyChart", open the app, select [facility], and log in with your MyChart username and password.  Due to Covid, a mask is required upon entering the hospital/clinic. If you do not have a mask, one will be given to you upon arrival. For doctor visits, patients may have 1 support person aged 18 or older with them. For treatment visits, patients cannot have anyone with them due to current Covid guidelines and our immunocompromised population.   

## 2020-09-05 NOTE — Telephone Encounter (Signed)
Proceed with treatment today without current results of thyroid panel.

## 2020-09-05 NOTE — Progress Notes (Signed)
Per Dr. Jana Hakim, "OK To Treat w/out CMP results today."

## 2020-09-06 ENCOUNTER — Encounter (HOSPITAL_BASED_OUTPATIENT_CLINIC_OR_DEPARTMENT_OTHER): Payer: Medicare Other | Admitting: Internal Medicine

## 2020-09-06 DIAGNOSIS — C44729 Squamous cell carcinoma of skin of left lower limb, including hip: Secondary | ICD-10-CM | POA: Diagnosis not present

## 2020-09-06 DIAGNOSIS — C4492 Squamous cell carcinoma of skin, unspecified: Secondary | ICD-10-CM | POA: Diagnosis not present

## 2020-09-06 DIAGNOSIS — I872 Venous insufficiency (chronic) (peripheral): Secondary | ICD-10-CM

## 2020-09-06 DIAGNOSIS — L97829 Non-pressure chronic ulcer of other part of left lower leg with unspecified severity: Secondary | ICD-10-CM

## 2020-09-06 DIAGNOSIS — L97819 Non-pressure chronic ulcer of other part of right lower leg with unspecified severity: Secondary | ICD-10-CM | POA: Diagnosis not present

## 2020-09-06 DIAGNOSIS — C44722 Squamous cell carcinoma of skin of right lower limb, including hip: Secondary | ICD-10-CM | POA: Diagnosis not present

## 2020-09-06 NOTE — Progress Notes (Signed)
[name] ([id number]) , Visit Report for 09/06/2020 Chief Complaint Document Details Patient Name: Date of Service: [name], [place] 09/06/2020 1:15 PM Medical Record Number: [id number] Patient Account Number: [id number] Date of Birth/Sex: Treating RN: Oct 26, 1925 (87 y.o. [name], [name] Primary Care Provider: [name] Other Clinician: Referring Provider: Treating Provider/Extender: [name] in Treatment: 12 Information Obtained from: Patient Chief Complaint Bilateral lower extremity wounds that have been biopsied and positive for squamous cell carcinoma Electronic Signature(s) Signed: 09/06/2020 2:38:54 PM By: Kalman Shan DO Entered By: Kalman Shan on 09/06/2020 14:30:46 -------------------------------------------------------------------------------- HPI Details Patient Name: Date of Service: [name], [name]. 09/06/2020 1:15 PM Medical Record Number: [id number] Patient Account Number: [id number] Date of Birth/Sex: Treating RN: [birth year] (87 y.o. [name] Primary Care Provider: [name] Other Clinician: Referring Provider: Treating Provider/Extender: [name] in Treatment: 12 History of Present Illness Location: left leg HPI Description: Admission 5/23 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 She states that the wound on the right has been present for a year. The 1 on the left has opened up 1 month ago. She is followed with oncology for this issue as she had biopsies that showed squamous cell carcinoma. She is also seeing radiation oncology for treatment options. She presents today because she would like for her wounds to  be healed by [place]. She currently denies signs of infection. 6/1; patient presents for 1 week follow-up. She states she has tolerated the leg wraps well. She states these do not bother her and is happy to continue with them. She is scheduled to see her oncologist today to go over treatment options for the bilateral lower extremity squamous cell carcinoma. Radiation is currently not a recommended option. Patient states she overall feels well. 6/22; patient presents for 3-week follow-up. She has tolerated the wraps well until her last wrap where she states they were uncomfortable. She attributes this to the home health nurse. She denies signs of infection. She has started her first treatment of antibody infusions for her Bilateral lower extremity squamous cell carcinoma. She has no complaints today. 7/21; patient presents for 1 month follow-up. Unfortunately she has not had good experience with her wrap changes with home health. She would like to do her own dressing changes. She continues to do her antibody infusions. She denies signs of infection. 7/28; patient presents for 1 week follow-up. At last clinic visit she was switched to daily dressing changes due to issues with the wrap and home health placing them. Unfortunately she has developed weeping to her legs bilaterally. She would like to be placed in wraps today. She would also like to follow with [place] weekly for wrap changes instead of having home health change them. She denies signs of infection. 8/4; patient presents for 1 week follow-up. She has tolerated the Kerlix/Coban wraps well. She no longer has weeping to her legs. She took the wrap off 1 day before coming in to be able to take a shower. She has no issues or complaints today. She denies signs of infection. 8/18; patient presents for follow-up. Patient has tolerated the wraps well. She brought her Velcro compression wraps today. She has no issues or complaints today. She had her chemotherapy  infusion  yesterday without issues. She denies signs of infection. Electronic Signature(s) Signed: 09/06/2020 2:38:54 PM By: Kalman Shan DO Entered By: Kalman Shan on 09/06/2020 14:31:45 -------------------------------------------------------------------------------- Physical Exam Details Patient Name: Date of Service: [name], [place] 09/06/2020 1:15 PM Medical Record Number: [id number] Patient Account Number: [id number] Date of Birth/Sex: Treating RN: 05/22/1925 (87 y.o. [name], [name] Primary Care Provider: [name] Other Clinician: Referring Provider: Treating Provider/Extender: [name] in Treatment: 12 Constitutional respirations regular, non-labored and within target range for patient.[name] Psychiatric pleasant and cooperative. Notes Lesions to her legs bilaterally are no longer present. She has dried lymphedema fluid to her legs bilaterally. There is no weeping to her legs. There are no signs of infection. Electronic Signature(s) Signed: 09/06/2020 2:38:54 PM By: Kalman Shan DO Entered By: Kalman Shan on 09/06/2020 14:33:07 -------------------------------------------------------------------------------- Physician Orders Details Patient Name: Date of Service: [name], [place]. 09/06/2020 1:15 PM Medical Record Number: [id number] Patient Account Number: [id number] Date of Birth/Sex: Treating RN: Apr 15, 1925 (87 y.o. [name] Primary Care Provider: [name] Other Clinician: Referring Provider: Treating Provider/Extender: [name] in Treatment: 46 Verbal / Phone Orders: No Diagnosis Coding ICD-10 Coding Code Description C44.92 Squamous cell carcinoma of skin, unspecified L97.819 Non-pressure chronic ulcer of other part of right lower leg with unspecified severity L97.829 Non-pressure chronic ulcer of other part of left lower leg with unspecified  severity I87.2 Venous insufficiency (chronic) (peripheral) Follow-up Appointments ppointment in 1 week. - to make sure legs are still healed. Dr. Heber Sidell Return A Edema Control - Lymphedema / SCD / Other Elevate legs to the level of the heart or above for 30 minutes daily and/or when sitting, a frequency of: Avoid standing for long periods of time. Patient to wear own compression stockings every day. Electronic Signature(s) Signed: 09/06/2020 2:38:54 PM By: Kalman Shan DO Signed: 09/06/2020 2:38:54 PM By: Kalman Shan DO Entered By: Kalman Shan on 09/06/2020 14:33:23 -------------------------------------------------------------------------------- Problem List Details Patient Name: Date of Service: [name], [place] 09/06/2020 1:15 PM Medical Record Number: [id number] Patient Account Number: [id number] Date of Birth/Sex: Treating RN: [birth year] (87 y.o. [name], [name] Primary Care Provider: [name] Other Clinician: Referring Provider: Treating Provider/Extender: [name] in Treatment: 12 Active Problems ICD-10 Encounter Code Description Active Date MDM Diagnosis C44.92 Squamous cell carcinoma of skin, unspecified 06/11/2020 No Yes L97.819 Non-pressure chronic ulcer of other part of right lower leg with unspecified 06/11/2020 No Yes severity L97.829 Non-pressure chronic ulcer of other part of left lower leg with unspecified 06/11/2020 No Yes severity I87.2 Venous insufficiency (chronic) (peripheral) 06/11/2020 No Yes Inactive Problems Resolved Problems Electronic Signature(s) Signed: 09/06/2020 2:38:54 PM By: Kalman Shan DO Entered By: Kalman Shan on 09/06/2020 14:30:28 -------------------------------------------------------------------------------- Progress Note Details Patient Name: Date of Service: [name], [name] 09/06/2020 1:15 PM Medical Record Number: [id number] Patient Account Number:  [id number] Date of Birth/Sex: Treating RN: 05/09/1925 (87 y.o. [name] Primary Care Provider: [name] Other Clinician: Referring Provider: Treating Provider/Extender: [name] in Treatment: 12 Subjective Chief Complaint Information obtained from Patient Bilateral lower extremity wounds that have been biopsied and positive for squamous cell carcinoma History of Present Illness (HPI) The following HPI elements were documented for the patient's wound: Location: left leg Admission 5/23 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 She states that the wound on the right has been present for a year. The 1 on the left has opened up 1 month ago. She is followed with oncology for this issue as she had biopsies that showed squamous cell carcinoma. She is also seeing radiation oncology for treatment options. She presents today because she would like for her wounds to be healed by [place]. She currently denies signs of infection. 6/1; patient presents for 1 week follow-up. She states she has tolerated the leg wraps well. She states these do not bother her and is happy to continue with them. She is scheduled to see her oncologist today to go over treatment options for the bilateral lower extremity squamous cell carcinoma. Radiation is currently not a recommended option. Patient states she overall feels well. 6/22; patient presents for 3-week follow-up. She has tolerated the wraps well until her last wrap where she states they were uncomfortable. She attributes this to the home health nurse. She denies signs of infection. She has started her first treatment of antibody infusions for her Bilateral lower extremity squamous  cell carcinoma. She has no complaints today. 7/21; patient presents for 1 month follow-up. Unfortunately she has not had good experience with her wrap changes with home health. She would like to do her own dressing changes. She continues to do her antibody infusions. She denies signs of infection. 7/28; patient presents for 1 week follow-up. At last clinic visit she was switched to daily dressing changes due to issues with the wrap and home health placing them. Unfortunately she has developed weeping to her legs bilaterally. She would like to be placed in wraps today. She would also like to follow with [place] weekly for wrap changes instead of having home health change them. She denies signs of infection. 8/4; patient presents for 1 week follow-up. She has tolerated the Kerlix/Coban wraps well. She no longer has weeping to her legs. She took the wrap off 1 day before coming in to be able to take a shower. She has no issues or complaints today. She denies signs of infection. 8/18; patient presents for follow-up. Patient has tolerated the wraps well. She brought her Velcro compression wraps today. She has no issues or complaints today. She had her chemotherapy infusion yesterday without issues. She denies signs of infection. Patient History Information obtained from Patient. Family History Unknown History. Social History Never smoker, Marital Status - Single, Alcohol Use - Never, Drug Use - No History, Caffeine Use - Never. Medical History Eyes Denies history of Cataracts, Optic Neuritis Ear/Nose/Mouth/Throat Denies history of Chronic sinus problems/congestion, Middle ear problems Hematologic/Lymphatic Patient has history of Anemia Denies history of Hemophilia, Human Immunodeficiency Virus, Lymphedema, Sickle Cell Disease Respiratory Denies history of Aspiration, Asthma, Chronic Obstructive Pulmonary Disease (COPD), Pneumothorax, Sleep Apnea, Tuberculosis Cardiovascular Patient has history of  Arrhythmia - Atrial Flutter, A fibb, Congestive Heart Failure, Hypertension Denies history of Angina, Coronary Artery Disease, Deep Vein Thrombosis, Hypotension, Myocardial Infarction, Peripheral Arterial Disease, Peripheral Venous Disease, Phlebitis, Vasculitis Gastrointestinal Patient has history of Colitis Denies history of Cirrhosis , Crohnoos, Hepatitis A, Hepatitis B, Hepatitis C Endocrine Denies history of Type I Diabetes, Type II Diabetes Genitourinary Denies history of End Stage Renal Disease Immunological Denies history of Lupus Erythematosus, Raynaudoos, Scleroderma Integumentary (Skin) Denies history of History of Burn Musculoskeletal Patient has history of Osteoarthritis Denies history of Gout, Rheumatoid Arthritis, Osteomyelitis Neurologic Denies  history of Dementia, Neuropathy, Quadriplegia, Paraplegia, Seizure Disorder Oncologic Denies history of Received Chemotherapy, Received Radiation Hospitalization/Surgery History - removal of rod left leg. 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 Vitals Time Taken: 2:17 PM, Height: 65 in, Weight: 163 lbs, BMI: 27.1, Temperature: 98.7 F, Pulse: 74 bpm, Respiratory Rate: 17 breaths/min, Blood Pressure: 133/74 mmHg. Psychiatric pleasant and cooperative. General Notes: Lesions to her legs bilaterally are no longer present. She has dried lymphedema fluid to her legs bilaterally. There is no weeping to her legs. There are no signs of infection. Integumentary (Hair, Skin) Wound #6 status is Healed - Epithelialized. Original cause of wound was Blister. The date acquired was: 04/20/2020. The wound has been in treatment 12 weeks. The wound is located on the Right,Anterior Lower Leg. The wound measures 0cm length  x 0cm width x 0cm depth; 0cm^2 area and 0cm^3 volume. There is a medium amount of serosanguineous drainage noted. Wound #7 status is Healed - Epithelialized. Original cause of wound was Blister. The date acquired was: 04/20/2020. The wound has been in treatment 12 weeks. The wound is located on the Left,Anterior Lower Leg. The wound measures 0cm length x 0cm width x 0cm depth; 0cm^2 area and 0cm^3 volume. There is a medium amount of serosanguineous drainage noted. Assessment Active Problems ICD-10 Squamous cell carcinoma of skin, unspecified Non-pressure chronic ulcer of other part of right lower leg with unspecified severity Non-pressure chronic ulcer of other part of left lower leg with unspecified severity Venous insufficiency (chronic) (peripheral) Patient presents today with no lesions (SCC) to her lower extremities bilaterally. She has been doing immunotherapy infusions to help treat the squamous cell carcinoma. I think this has done remarkably well for her. She does have areas of dried scattered lymphedema fluid. Her skin is sensitive and skin may break open without compression therapy. I recommended continuing with compression therapy daily and we will place her Velcro wraps today. She would like to follow-up in 1 week to assure that she no longer has any issues. I will see her in 1 week. Plan Follow-up Appointments: Return Appointment in 1 week. - to make sure legs are still healed. Dr. Heber [place] Edema Control - Lymphedema / SCD / Other: Elevate legs to the level of the heart or above for 30 minutes daily and/or when sitting, a frequency of: Avoid standing for long periods of time. Patient to wear own compression stockings every day. 1. Daily compression wraps 2. Follow-up in 1 week Electronic Signature(s) Signed: 09/06/2020 2:38:54 PM By: Kalman Shan DO Entered By: Kalman Shan on 09/06/2020  14:36:45 -------------------------------------------------------------------------------- HxROS Details Patient Name: Date of Service: [name], [place]. 09/06/2020 1:15 PM Medical Record Number: [id number] Patient Account Number: [id number] Date of Birth/Sex: Treating RN: 1925/08/08 (87 y.o. [name] Primary Care Provider: [name] Other Clinician: Referring Provider: Treating Provider/Extender: [name] in Treatment: 12 Information Obtained From Patient Eyes Medical History: Negative for: Cataracts; Optic Neuritis Ear/Nose/Mouth/Throat Medical History: Negative for: Chronic sinus problems/congestion; Middle ear problems Hematologic/Lymphatic Medical History: Positive for: Anemia Negative for: Hemophilia; Human Immunodeficiency Virus; Lymphedema; Sickle Cell Disease Respiratory Medical History: Negative for: Aspiration; Asthma; Chronic Obstructive Pulmonary Disease (COPD); Pneumothorax; Sleep Apnea; Tuberculosis Cardiovascular Medical History: Positive for: Arrhythmia - Atrial Flutter, A fibb; Congestive Heart Failure; Hypertension Negative for: Angina; Coronary Artery Disease; Deep Vein Thrombosis; Hypotension; Myocardial Infarction;  Peripheral Arterial Disease; Peripheral Venous Disease; Phlebitis; Vasculitis Past Medical History Notes: hyperlipidemia Gastrointestinal Medical History: Positive for: Colitis Negative for: Cirrhosis ; Crohns; Hepatitis A; Hepatitis B; Hepatitis C Endocrine Medical History: Negative for: Type I Diabetes; Type II Diabetes Past Medical History Notes: hypothyroidism Genitourinary Medical History: Negative for: End Stage Renal Disease Immunological Medical History: Negative for: Lupus Erythematosus; Raynauds; Scleroderma Integumentary (Skin) Medical History: Negative for: History of Burn Musculoskeletal Medical History: Positive for: Osteoarthritis Negative for: Gout;  Rheumatoid Arthritis; Osteomyelitis Neurologic Medical History: Negative for: Dementia; Neuropathy; Quadriplegia; Paraplegia; Seizure Disorder Past Medical History Notes: lumbar spindylolysis Oncologic Medical History: Negative for: Received Chemotherapy; Received Radiation Past Medical History Notes: BLE squamous ceel carcionoma Immunizations Pneumococcal Vaccine: Received Pneumococcal Vaccination: Yes Received Pneumococcal Vaccination On or After 60th Birthday: No Implantable Devices None Hospitalization / Surgery History Type of Hospitalization/Surgery removal of rod left leg Family and Social History Unknown History: Yes; Never smoker; Marital Status - Single; Alcohol Use: Never; Drug Use: No History; Caffeine Use: Never; Financial Concerns: No; Food, Clothing or Shelter Needs: No; Support System Lacking: No; Transportation Concerns: No Electronic Signature(s) Signed: 09/06/2020 2:38:54 PM By: Kalman Shan DO Signed: 09/06/2020 5:10:54 PM By: Rhae Hammock RN Entered By: Kalman Shan on 09/06/2020 14:31:53 -------------------------------------------------------------------------------- SuperBill Details Patient Name: Date of Service: [name], [name] 09/06/2020 Medical Record Number: [id number] Patient Account Number: [id number] Date of Birth/Sex: Treating RN: Nov 20, 1925 (87 y.o. 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

## 2020-09-06 NOTE — Progress Notes (Signed)
[name] ([id number]) , Visit Report for 09/06/2020 Arrival Information Details Patient Name: Date of Service: [name], [name]. 09/06/2020 1:15 PM Medical Record Number: [id number] Patient Account Number: [id number] Date of Birth/Sex: Treating RN: 04-16-1925 (87 y.o. [name] Primary Care [name]: [name] [name]: Referring Kiyoshi Schaab: Treating Genise Strack/Extender: [name] in Treatment: 12 Visit Information History Since Last Visit Added or deleted any medications: No Patient Arrived: Walker Any new allergies or adverse reactions: No Arrival Time: 14:17 Had a fall or experienced change in No Accompanied By: self activities of daily living that may affect Transfer Assistance: None risk of falls: Patient Identification Verified: Yes Signs or symptoms of abuse/neglect since last visito No Secondary Verification Process Completed: Yes Hospitalized since last visit: No Patient Requires Transmission-Based No Implantable device outside of the clinic excluding No Precautions: cellular tissue based products placed in the center Patient Has Alerts: Yes since last visit: Patient Alerts: R ABI = Non compressible Has Dressing in Place as Prescribed: Yes L ABI = Non compressible Pain Present Now: No Electronic Signature(s) Signed: 09/06/2020 5:51:06 PM By: Lorrin Jackson Entered By: Lorrin Jackson on 09/06/2020 14:17:40 -------------------------------------------------------------------------------- Clinic Level of Care Assessment Details Patient Name: Date of Service: [name], [name] 09/06/2020 1:15 PM Medical Record Number: [id number] Patient Account Number: [id number] Date of Birth/Sex: Treating RN: [birth year] (87 y.o. [name] Primary Care Samiksha Pellicano: [name] [name]: Referring Shaundra Fullam: Treating Vennie Salsbury/Extender: [name] in Treatment: 12 Clinic  Level of Care Assessment Items TOOL 4 Quantity Score X- 1 0 Use when only an EandM is performed on FOLLOW-UP visit ASSESSMENTS - Nursing Assessment / Reassessment X- 1 10 Reassessment of Co-morbidities (includes updates in patient status) X- 1 5 Reassessment of Adherence to Treatment Plan ASSESSMENTS - Wound and Skin A ssessment / Reassessment X - Simple Wound Assessment / Reassessment - one wound 1 5 '[]'$  - 0 Complex Wound Assessment / Reassessment - multiple wounds '[]'$  - 0 Dermatologic / Skin Assessment (not related to wound area) ASSESSMENTS - Focused Assessment X- 1 5 Circumferential Edema Measurements - multi extremities '[]'$  - 0 Nutritional Assessment / Counseling / Intervention '[]'$  - 0 Lower Extremity Assessment (monofilament, tuning fork, pulses) '[]'$  - 0 Peripheral Arterial Disease Assessment (using hand held doppler) ASSESSMENTS - Ostomy and/or Continence Assessment and Care '[]'$  - 0 Incontinence Assessment and Management '[]'$  - 0 Ostomy Care Assessment and Management (repouching, etc.) PROCESS - Coordination of Care X - Simple Patient / Family Education for ongoing care 1 15 '[]'$  - 0 Complex (extensive) Patient / Family Education for ongoing care X- 1 10 Staff obtains Programmer, systems, Records, T Results / Process Orders est '[]'$  - 0 Staff telephones HHA, Nursing Homes / Clarify orders / etc '[]'$  - 0 Routine Transfer to another Facility (non-emergent condition) '[]'$  - 0 Routine Hospital Admission (non-emergent condition) '[]'$  - 0 New Admissions / Biomedical engineer / Ordering NPWT Apligraf, etc. , '[]'$  - 0 Emergency Hospital Admission (emergent condition) X- 1 10 Simple Discharge Coordination '[]'$  - 0 Complex (extensive) Discharge Coordination PROCESS - Special Needs '[]'$  - 0 Pediatric / Minor Patient Management '[]'$  - 0 Isolation Patient Management '[]'$  - 0 Hearing / Language / Visual special needs '[]'$  - 0 Assessment of Community assistance (transportation, D/C planning, etc.) '[]'$   - 0 Additional assistance / Altered mentation '[]'$  - 0 Support Surface(s) Assessment (bed, cushion, seat, etc.) INTERVENTIONS - Wound Cleansing / Measurement X - Simple Wound  Cleansing - one wound 1 5 '[]'$  - 0 Complex Wound Cleansing - multiple wounds X- 1 5 Wound Imaging (photographs - any number of wounds) '[]'$  - 0 Wound Tracing (instead of photographs) X- 1 5 Simple Wound Measurement - one wound '[]'$  - 0 Complex Wound Measurement - multiple wounds INTERVENTIONS - Wound Dressings X - Small Wound Dressing one or multiple wounds 1 10 '[]'$  - 0 Medium Wound Dressing one or multiple wounds '[]'$  - 0 Large Wound Dressing one or multiple wounds '[]'$  - 0 Application of Medications - topical '[]'$  - 0 Application of Medications - injection INTERVENTIONS - Miscellaneous '[]'$  - 0 External ear exam '[]'$  - 0 Specimen Collection (cultures, biopsies, blood, body fluids, etc.) '[]'$  - 0 Specimen(s) / Culture(s) sent or taken to Lab for analysis '[]'$  - 0 Patient Transfer (multiple staff / Civil Service fast streamer / Similar devices) '[]'$  - 0 Simple Staple / Suture removal (25 or less) '[]'$  - 0 Complex Staple / Suture removal (26 or more) '[]'$  - 0 Hypo / Hyperglycemic Management (close monitor of Blood Glucose) '[]'$  - 0 Ankle / Brachial Index (ABI) - do not check if billed separately X- 1 5 Vital Signs Has the patient been seen at the hospital within the last three years: Yes Total Score: 90 Level Of Care: New/Established - Level 3 Electronic Signature(s) Signed: 09/06/2020 5:51:06 PM By: Lorrin Jackson Entered By: Lorrin Jackson on 09/06/2020 14:19:59 -------------------------------------------------------------------------------- Encounter Discharge Information Details Patient Name: Date of Service: [name], Deby A. 09/06/2020 1:15 PM Medical Record Number: [id number] Patient Account Number: [id number] Date of Birth/Sex: Treating RN: 11/21/1925 (87 y.o. [name] Primary Care Alphia Behanna: [name] [name]  Bell: Referring Reis Pienta: Treating Kanika Bungert/Extender: [name] in Treatment: 12 Encounter Discharge Information Items Discharge Condition: Stable Ambulatory Status: Walker Discharge Destination: Home Transportation: Private Auto Accompanied By: self Schedule Follow-up Appointment: Yes Clinical Summary of Care: Patient Declined Electronic Signature(s) Signed: 09/06/2020 5:51:06 PM By: Lorrin Jackson Entered By: Lorrin Jackson on 09/06/2020 14:20:35 -------------------------------------------------------------------------------- Lower Extremity Assessment Details Patient Name: Date of Service: [name], [place]. 09/06/2020 1:15 PM Medical Record Number: [id number] Patient Account Number: [id number] Date of Birth/Sex: Treating RN: April 05, 1925 (87 y.o. [name] Primary Care Briar Witherspoon: [name] [name]: Referring Norfleet Capers: Treating Idrissa Beville/Extender: [name] in Treatment: 12 Edema Assessment Assessed: [Left: Yes] [name]: Yes] Edema: [Left: Yes] [Bell: Yes] Calf Left: Bell: Point of Measurement: 30 cm From Medial Instep 42 cm 42.5 cm Ankle Left: Bell: Point of Measurement: 9 cm From Medial Instep 25 cm 25.5 cm Vascular Assessment Pulses: Dorsalis Pedis Palpable: [Left:Yes] [Bell:Yes] Posterior Tibial Palpable: [Left:Yes] [Bell:Yes] Electronic Signature(s) Signed: 09/06/2020 5:51:06 PM By: Lorrin Jackson Entered By: Lorrin Jackson on 09/06/2020 14:18:25 -------------------------------------------------------------------------------- Multi Wound Chart Details Patient Name: Date of Service: [name], Latitia A. 09/06/2020 1:15 PM Medical Record Number: [id number] Patient Account Number: [id number] Date of Birth/Sex: Treating RN: [birth year] (87 y.o. [name], [name] Primary Care Quantavious Eggert: [name] [name]: Referring Kelven Flater: Treating  [name]/Extender: [name] in Treatment: 12 Vital Signs Height(in): 65 Pulse(bpm): 29 Weight(lbs): 163 Blood Pressure(mmHg): 133/74 Body Mass Index(BMI): 27 Temperature(F): 98.7 Respiratory Rate(breaths/min): 17 Photos: [6:No Photos Bell, Anterior Lower Leg] [7:No Photos Left, Anterior Lower Leg] [N/A:N/A N/A] Wound Location: [6:Blister] [7:Blister] [N/A:N/A] Wounding Event: [6:Malignant Wound] [7:Malignant Wound] [N/A:N/A] Primary Etiology: [6:04/20/2020] [7:04/20/2020] [N/A:N/A] Date Acquired: [6:12] [7:12] [N/A:N/A] Weeks of Treatment: [6:Healed - Epithelialized] [7:Healed - Epithelialized] [N/A:N/A] Wound  Status: [6:0x0x0] [7:0x0x0] [N/A:N/A] Measurements L x W x D (cm) [6:0] [7:0] [N/A:N/A] A (cm) : rea [6:0] [7:0] [N/A:N/A] Volume (cm) : [6:100.00%] [7:100.00%] [N/A:N/A] % Reduction in A rea: [6:100.00%] [7:100.00%] [N/A:N/A] % Reduction in Volume: [6:Full Thickness Without Exposed] [7:Full Thickness Without Exposed] [N/A:N/A] Classification: [6:Support Structures Medium] [7:Support Structures Medium] [N/A:N/A] Exudate Amount: [6:Serosanguineous] [7:Serosanguineous] [N/A:N/A] Exudate Type: [6:red, brown] [7:red, brown] [N/A:N/A] Treatment Notes Electronic Signature(s) Signed: 09/06/2020 2:38:54 PM By: Kalman Shan DO Signed: 09/06/2020 5:10:54 PM By: Rhae Hammock RN Entered By: Kalman Shan on 09/06/2020 14:30:33 -------------------------------------------------------------------------------- Multi-Disciplinary Care Plan Details Patient Name: Date of Service: [name], Sierria A. 09/06/2020 1:15 PM Medical Record Number: [id number] Patient Account Number: [id number] Date of Birth/Sex: Treating RN: [birth year] (87 y.o. [name] Primary Care Cecylia Brazill: [name]: [name] Referring [name]: Treating [name]/Extender: [name] in Treatment: [name] reviewed with [name] Active Inactive Electronic Signature(s) Signed: 09/06/2020 5:51:06 PM By: Lorrin Jackson Entered By: Lorrin Jackson on 09/06/2020 14:19:17 -------------------------------------------------------------------------------- Pain Assessment Details Patient Name: Date of Service: [name], [place] 09/06/2020 1:15 PM Medical Record Number: [id number] Patient Account Number: [id number] Date of Birth/Sex: Treating RN: March 14, 1925 (87 y.o. [name] Primary Care Moody Robben: [name] [name]: Referring Brayten Komar: Treating Marlise Fahr/Extender: [name] in Treatment: 12 Active Problems Location of Pain Severity and Description of Pain Patient Has Paino No Site Locations Pain Management and Medication Current Pain Management: Electronic Signature(s) Signed: 09/06/2020 5:51:06 PM By: Lorrin Jackson Entered By: Lorrin Jackson on 09/06/2020 14:18:09 -------------------------------------------------------------------------------- Patient/Caregiver Education Details Patient Name: Date of Service: [name], [name] 8/18/2022andnbsp1:15 PM Medical Record Number: [id number] Patient Account Number: [id number] Date of Birth/Gender: Treating RN: 1925-07-26 (87 y.o. [name] Primary Care [name]: [name] [name]: Referring [name]: Treating [name]/Extender: [name] in Treatment: 12 Education Assessment Education Provided To: Patient Education Topics Provided Wound/Skin Impairment: Methods: Explain/Verbal Responses: State content correctly [employer]) Signed: 09/06/2020 5:51:06 PM By: Lorrin Jackson Entered By: Lorrin Jackson on 09/06/2020 14:19:30 -------------------------------------------------------------------------------- Wound Assessment Details Patient Name: Date of Service: [name], [place]. 09/06/2020 1:15  PM Medical Record Number: [id number] Patient Account Number: [id number] Date of Birth/Sex: Treating RN: 1925-08-02 (87 y.o. [name] Primary Care Draya Felker: [name] [name]: Referring Niani Mourer: Treating Timo Hartwig/Extender: [name] in Treatment: 12 Wound Status Wound Number: [id number] Primary Etiology: Malignant Wound Wound Location: Bell, Anterior Lower Leg Wound Status: Healed - Epithelialized Wounding Event: Blister Date Acquired: 04/20/2020 Weeks Of Treatment: 12 Clustered Wound: No Wound Measurements Length: (cm) Width: (cm) Depth: (cm) Area: (cm) Volume: (cm) 0 % Reduction in Area: 100% 0 % Reduction in Volume: 100% 0 0 0 Wound Description Classification: Full Thickness Without Exposed Support Struct Exudate Amount: Medium Exudate Type: Serosanguineous Exudate Color: red, brown ures Electronic Signature(s) Signed: 09/06/2020 5:51:06 PM By: Lorrin Jackson Entered By: Lorrin Jackson on 09/06/2020 14:18:36 -------------------------------------------------------------------------------- Wound Assessment Details Patient Name: Date of Service: [name], Moria A. 09/06/2020 1:15 PM Medical Record Number: [id number] Patient Account Number: [id number] Date of Birth/Sex: Treating RN: 1925/01/26 (87 y.o. [name] Primary Care Jazier Mcglamery: [name] [name]: Referring Roseanne Juenger: Treating Tyniesha Howald/Extender: [name] in Treatment: 12 Wound Status Wound Number: 7 Primary Etiology: Malignant Wound Wound Location: Left, Anterior Lower Leg Wound Status: Healed - Epithelialized Wounding Event: Blister Date Acquired: 04/20/2020 Weeks Of Treatment: 12 Clustered  Wound: No Wound Measurements Length: (cm) Width: (cm) Depth: (cm) Area: (cm) Volume: (cm) 0 % Reduction in Area: 100% 0 % Reduction in Volume: 100% 0 0 0 Wound Description Classification:  Full Thickness Without Exposed Support Struct Exudate Amount: Medium Exudate Type: Serosanguineous Exudate Color: red, brown ures Electronic Signature(s) Signed: 09/06/2020 5:51:06 PM By: Lorrin Jackson Entered By: Lorrin Jackson on 09/06/2020 14:18:36 -------------------------------------------------------------------------------- Vitals Details Patient Name: Date of Service: [name], Mayte A. 09/06/2020 1:15 PM Medical Record Number: [id number] Patient Account Number: [id number] Date of Birth/Sex: Treating RN: [birth year] (87 y.o. [name] Primary Care Nigel Ericsson: [name] [name]: Referring Shiro Ellerman: Treating Griffyn Kucinski/Extender: [name] in Treatment: 12 Vital Signs Time Taken: 14:17 Temperature (F): 98.7 Height (in): 65 Pulse (bpm): 74 Weight (lbs): 163 Respiratory Rate (breaths/min): 17 Body Mass Index (BMI): 27.1 Blood Pressure (mmHg): 133/74 Reference Range: 80 - 120 mg / dl Electronic Signature(s) Signed: 09/06/2020 5:51:06 PM By: Lorrin Jackson Entered By: Lorrin Jackson on 09/06/2020 14:18:03

## 2020-09-13 ENCOUNTER — Encounter (HOSPITAL_BASED_OUTPATIENT_CLINIC_OR_DEPARTMENT_OTHER): Payer: Medicare Other | Admitting: Internal Medicine

## 2020-09-13 ENCOUNTER — Other Ambulatory Visit: Payer: Self-pay

## 2020-09-13 DIAGNOSIS — L97829 Non-pressure chronic ulcer of other part of left lower leg with unspecified severity: Secondary | ICD-10-CM | POA: Diagnosis not present

## 2020-09-13 DIAGNOSIS — L97819 Non-pressure chronic ulcer of other part of right lower leg with unspecified severity: Secondary | ICD-10-CM

## 2020-09-13 DIAGNOSIS — I872 Venous insufficiency (chronic) (peripheral): Secondary | ICD-10-CM | POA: Diagnosis not present

## 2020-09-13 DIAGNOSIS — C44722 Squamous cell carcinoma of skin of right lower limb, including hip: Secondary | ICD-10-CM | POA: Diagnosis not present

## 2020-09-13 DIAGNOSIS — C4492 Squamous cell carcinoma of skin, unspecified: Secondary | ICD-10-CM | POA: Diagnosis not present

## 2020-09-13 DIAGNOSIS — C44729 Squamous cell carcinoma of skin of left lower limb, including hip: Secondary | ICD-10-CM | POA: Diagnosis not present

## 2020-09-18 NOTE — Progress Notes (Signed)
[name] ([id number]) , Visit Report for 09/13/2020 Chief Complaint Document Details Patient Name: Date of Service: [name], [place] 09/13/2020 1:30 PM Medical Record Number: [id number] Patient Account Number: [id number] Date of Birth/Sex: Treating RN: Dec 22, 1925 (87 y.o. [name] Primary Care Provider: [name] Other Clinician: Referring Provider: Treating Provider/Extender: [name] in Treatment: 13 Information Obtained from: Patient Chief Complaint Bilateral lower extremity wounds that have been biopsied and positive for squamous cell carcinoma Electronic Signature(s) Signed: 09/13/2020 1:50:18 PM By: Kalman Shan DO Entered By: Kalman Shan on 09/13/2020 13:44:40 -------------------------------------------------------------------------------- HPI Details Patient Name: Date of Service: [name], [name] 09/13/2020 1:30 PM Medical Record Number: [id number] Patient Account Number: [id number] Date of Birth/Sex: Treating RN: [birth year] (87 y.o. [name] Primary Care Provider: [name] Other Clinician: Referring Provider: Treating Provider/Extender: [name] in Treatment: 13 History of Present Illness Location: left leg HPI Description: Admission 5/23 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 She states that the wound on the right has been present for a year. The 1 on the left has opened up 1 month ago. She is followed with oncology for this issue as she had biopsies that showed squamous cell carcinoma. She is also seeing radiation oncology for treatment options. She presents today because she would like for her wounds to  be healed by [place]. She currently denies signs of infection. 6/1; patient presents for 1 week follow-up. She states she has tolerated the leg wraps well. She states these do not bother her and is happy to continue with them. She is scheduled to see her oncologist today to go over treatment options for the bilateral lower extremity squamous cell carcinoma. Radiation is currently not a recommended option. Patient states she overall feels well. 6/22; patient presents for 3-week follow-up. She has tolerated the wraps well until her last wrap where she states they were uncomfortable. She attributes this to the home health nurse. She denies signs of infection. She has started her first treatment of antibody infusions for her Bilateral lower extremity squamous cell carcinoma. She has no complaints today. 7/21; patient presents for 1 month follow-up. Unfortunately she has not had good experience with her wrap changes with home health. She would like to do her own dressing changes. She continues to do her antibody infusions. She denies signs of infection. 7/28; patient presents for 1 week follow-up. At last clinic visit she was switched to daily dressing changes due to issues with the wrap and home health placing them. Unfortunately she has developed weeping to her legs bilaterally. She would like to be placed in wraps today. She would also like to follow with [place] weekly for wrap changes instead of having home health change them. She denies signs of infection. 8/4; patient presents for 1 week follow-up. She has tolerated the Kerlix/Coban wraps well. She no longer has weeping to her legs. She took the wrap off 1 day before coming in to be able to take a shower. She has no issues or complaints today. She denies signs of infection. 8/18; patient presents for follow-up. Patient has tolerated the wraps well. She brought her Velcro compression wraps today. She has no issues or complaints today. She had her chemotherapy  infusion  yesterday without issues. She denies signs of infection. 8/25; patient presents for follow-up. She used her juxta lite compressions for the past week. It is unclear if she is able to put these on correctly since she states she has a hard time getting them to look right. She reports 2 open wounds. She currently denies signs of infection. Electronic Signature(s) Signed: 09/13/2020 1:50:18 PM By: Kalman Shan DO Entered By: Kalman Shan on 09/13/2020 13:45:34 -------------------------------------------------------------------------------- Physical Exam Details Patient Name: Date of Service: [name], [name] 09/13/2020 1:30 PM Medical Record Number: [id number] Patient Account Number: [id number] Date of Birth/Sex: Treating RN: 09/16/1925 (87 y.o. [name] Primary Care Provider: [name] Other Clinician: Referring Provider: Treating Provider/Extender: [name] in Treatment: 13 Constitutional respirations regular, non-labored and within target range for patient.[name] Psychiatric pleasant and cooperative. Notes 2 small open wounds limited to skin breakdown 1 on each leg. 2+ pitting edema to her knee bilaterally. No signs of infection. No weeping to her legs. Electronic Signature(s) Signed: 09/13/2020 1:50:18 PM By: Kalman Shan DO Entered By: Kalman Shan on 09/13/2020 13:46:28 -------------------------------------------------------------------------------- Physician Orders Details Patient Name: Date of Service: [name], [place]. 09/13/2020 1:30 PM Medical Record Number: [id number] Patient Account Number: [id number] Date of Birth/Sex: Treating RN: 05-23-1925 (87 y.o. [name] Primary Care Provider: [name] Other Clinician: Referring Provider: Treating Provider/Extender: [name] in Treatment: 39 Verbal / Phone Orders: No Diagnosis Coding ICD-10  Coding Code Description C44.92 Squamous cell carcinoma of skin, unspecified L97.819 Non-pressure chronic ulcer of other part of right lower leg with unspecified severity L97.829 Non-pressure chronic ulcer of other part of left lower leg with unspecified severity I87.2 Venous insufficiency (chronic) (peripheral) Follow-up Appointments ppointment in 1 week. - Dr. Heber [place] Return A Bathing/ Shower/ Hygiene May shower with protection but do not get wound dressing(s) wet. - Use cast protector Edema Control - Lymphedema / SCD / Other Elevate legs to the level of the heart or above for 30 minutes daily and/or when sitting, a frequency of: - elevate the legs throughout the day heart level if possible. Avoid standing for long periods of time. Exercise regularly Additional Orders / Instructions Follow Nutritious Diet Wound Treatment Wound #6R - Lower Leg Wound Laterality: Right, Anterior Cleanser: Soap and Water 1 x Per Week/15 Days Discharge Instructions: May shower and wash wound with dial antibacterial soap and water prior to dressing change. Cleanser: Wound Cleanser 1 x Per Week/15 Days Discharge Instructions: Cleanse the wound with wound cleanser prior to applying a clean dressing using gauze sponges, not tissue or cotton balls. Peri-Wound Care: Triamcinolone 15 (g) 1 x Per Week/15 Days Discharge Instructions: In clinic only. Use triamcinolone 15 (g) in clinic only. Peri-Wound Care: Sween Lotion (Moisturizing lotion) 1 x Per Week/15 Days Discharge Instructions: Apply moisturizing lotion as directed Prim Dressing: KerraCel Ag Gelling Fiber Dressing, 2x2 in (silver alginate) 1 x Per Week/15 Days ary Discharge Instructions: Apply silver alginate to wound bed as instructed Secondary Dressing: Woven Gauze Sponge, Non-Sterile 4x4 in 1 x Per Week/15 Days Discharge Instructions: Apply over primary dressing as directed. Secondary Dressing: ABD Pad, 8x10 1 x Per Week/15 Days Discharge Instructions:  Apply over primary dressing as directed. Compression Wrap: Kerlix Roll 4.5x3.1 (in/yd) 1 x Per Week/15 Days Discharge Instructions: Apply Kerlix and Coban compression as directed. Compression Wrap: Coban Self-Adherent Wrap 4x5 (in/yd) 1 x Per Week/15 Days Discharge Instructions: Apply over Kerlix as directed. Compression Stockings: Circaid  Juxta Lite Compression Wrap Left Leg Compression Amount: 30-40 mmHG Right Leg Compression Amount: 30-40 mmHG Discharge Instructions: Apply Circaid Juxta Lite Compression Wrap daily as instructed. Apply first thing in the morning, remove at night before bed. Wound #7R - Lower Leg Wound Laterality: Left, Anterior Cleanser: Soap and Water 1 x Per Week/15 Days Discharge Instructions: May shower and wash wound with dial antibacterial soap and water prior to dressing change. Cleanser: Wound Cleanser 1 x Per Week/15 Days Discharge Instructions: Cleanse the wound with wound cleanser prior to applying a clean dressing using gauze sponges, not tissue or cotton balls. Peri-Wound Care: Triamcinolone 15 (g) 1 x Per Week/15 Days Discharge Instructions: In clinic only. Use triamcinolone 15 (g) in clinic only. Peri-Wound Care: Sween Lotion (Moisturizing lotion) 1 x Per Week/15 Days Discharge Instructions: Apply moisturizing lotion as directed Prim Dressing: KerraCel Ag Gelling Fiber Dressing, 2x2 in (silver alginate) 1 x Per Week/15 Days ary Discharge Instructions: Apply silver alginate to wound bed as instructed Secondary Dressing: Woven Gauze Sponge, Non-Sterile 4x4 in 1 x Per Week/15 Days Discharge Instructions: Apply over primary dressing as directed. Secondary Dressing: ABD Pad, 8x10 1 x Per Week/15 Days Discharge Instructions: Apply over primary dressing as directed. Compression Wrap: Kerlix Roll 4.5x3.1 (in/yd) 1 x Per Week/15 Days Discharge Instructions: Apply Kerlix and Coban compression as directed. Compression Wrap: Coban Self-Adherent Wrap 4x5 (in/yd) 1 x  Per Week/15 Days Discharge Instructions: Apply over Kerlix as directed. Electronic Signature(s) Signed: 09/13/2020 1:50:18 PM By: Kalman Shan DO Entered By: Kalman Shan on 09/13/2020 13:46:48 -------------------------------------------------------------------------------- Problem List Details Patient Name: Date of Service: [name], [place] 09/13/2020 1:30 PM Medical Record Number: [id number] Patient Account Number: [id number] Date of Birth/Sex: Treating RN: 01-24-1925 (87 y.o. [name] Primary Care Provider: [name] Other Clinician: Referring Provider: Treating Provider/Extender: [name] in Treatment: 46 Active Problems ICD-10 Encounter Code Description Active Date MDM Diagnosis C44.92 Squamous cell carcinoma of skin, unspecified 06/11/2020 No Yes L97.819 Non-pressure chronic ulcer of other part of right lower leg with unspecified 06/11/2020 No Yes severity L97.829 Non-pressure chronic ulcer of other part of left lower leg with unspecified 06/11/2020 No Yes severity I87.2 Venous insufficiency (chronic) (peripheral) 06/11/2020 No Yes Inactive Problems Resolved Problems Electronic Signature(s) Signed: 09/13/2020 1:50:18 PM By: Kalman Shan DO Entered By: Kalman Shan on 09/13/2020 13:44:24 -------------------------------------------------------------------------------- Progress Note Details Patient Name: Date of Service: [name], Sritha A. 09/13/2020 1:30 PM Medical Record Number: [id number] Patient Account Number: [id number] Date of Birth/Sex: Treating RN: [birth year] (87 y.o. [name] Primary Care Provider: [name] Other Clinician: Referring Provider: Treating Provider/Extender: [name] in Treatment: 13 Subjective Chief Complaint Information obtained from Patient Bilateral lower extremity wounds that have been biopsied and positive for  squamous cell carcinoma History of Present Illness (HPI) The following HPI elements were documented for the patient's wound: Location: left leg Admission 5/23 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 She states that the wound on the right has been present for a year. The 1 on the left has opened up 1 month ago. She is followed with oncology for this issue as she had biopsies that showed squamous cell carcinoma. She is also seeing radiation oncology for treatment options. She presents today  because she would like for her wounds to be healed by [place]. She currently denies signs of infection. 6/1; patient presents for 1 week follow-up. She states she has tolerated the leg wraps well. She states these do not bother her and is happy to continue with them. She is scheduled to see her oncologist today to go over treatment options for the bilateral lower extremity squamous cell carcinoma. Radiation is currently not a recommended option. Patient states she overall feels well. 6/22; patient presents for 3-week follow-up. She has tolerated the wraps well until her last wrap where she states they were uncomfortable. She attributes this to the home health nurse. She denies signs of infection. She has started her first treatment of antibody infusions for her Bilateral lower extremity squamous cell carcinoma. She has no complaints today. 7/21; patient presents for 1 month follow-up. Unfortunately she has not had good experience with her wrap changes with home health. She would like to do her own dressing changes. She continues to do her antibody infusions. She denies signs of infection. 7/28; patient presents for 1 week follow-up. At last clinic visit she was switched to daily  dressing changes due to issues with the wrap and home health placing them. Unfortunately she has developed weeping to her legs bilaterally. She would like to be placed in wraps today. She would also like to follow with [place] weekly for wrap changes instead of having home health change them. She denies signs of infection. 8/4; patient presents for 1 week follow-up. She has tolerated the Kerlix/Coban wraps well. She no longer has weeping to her legs. She took the wrap off 1 day before coming in to be able to take a shower. She has no issues or complaints today. She denies signs of infection. 8/18; patient presents for follow-up. Patient has tolerated the wraps well. She brought her Velcro compression wraps today. She has no issues or complaints today. She had her chemotherapy infusion yesterday without issues. She denies signs of infection. 8/25; patient presents for follow-up. She used her juxta lite compressions for the past week. It is unclear if she is able to put these on correctly since she states she has a hard time getting them to look right. She reports 2 open wounds. She currently denies signs of infection. Patient History Information obtained from Patient. Family History Unknown History. Social History Never smoker, Marital Status - Single, Alcohol Use - Never, Drug Use - No History, Caffeine Use - Never. Medical History Eyes Denies history of Cataracts, Optic Neuritis Ear/Nose/Mouth/Throat Denies history of Chronic sinus problems/congestion, Middle ear problems Hematologic/Lymphatic Patient has history of Anemia Denies history of Hemophilia, Human Immunodeficiency Virus, Lymphedema, Sickle Cell Disease Respiratory Denies history of Aspiration, Asthma, Chronic Obstructive Pulmonary Disease (COPD), Pneumothorax, Sleep Apnea, Tuberculosis Cardiovascular Patient has history of Arrhythmia - Atrial Flutter, A fibb, Congestive Heart Failure, Hypertension Denies history of Angina, Coronary  Artery Disease, Deep Vein Thrombosis, Hypotension, Myocardial Infarction, Peripheral Arterial Disease, Peripheral Venous Disease, Phlebitis, Vasculitis Gastrointestinal Patient has history of Colitis Denies history of Cirrhosis , Crohnoos, Hepatitis A, Hepatitis B, Hepatitis C Endocrine Denies history of Type I Diabetes, Type II Diabetes Genitourinary Denies history of End Stage Renal Disease Immunological Denies history of Lupus Erythematosus, Raynaudoos, Scleroderma Integumentary (Skin) Denies history of History of Burn Musculoskeletal Patient has history of Osteoarthritis Denies history of Gout, Rheumatoid Arthritis, Osteomyelitis Neurologic Denies history of Dementia, Neuropathy, Quadriplegia, Paraplegia, Seizure Disorder Oncologic Denies history of Received Chemotherapy, Received Radiation Hospitalization/Surgery History - removal of  rod left leg. 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 Vitals Time Taken: 1:21 PM, Height: 65 in, Weight: 163 lbs, BMI: 27.1, Temperature: 97.8 F, Pulse: 90 bpm, Respiratory Rate: 17 breaths/min, Blood Pressure: 129/73 mmHg. Psychiatric pleasant and cooperative. General Notes: 2 small open wounds limited to skin breakdown 1 on each leg. 2+ pitting edema to her knee bilaterally. No signs of infection. No weeping to her legs. Integumentary (Hair, Skin) Wound #6R status is Open. Original cause of wound was Blister. The date acquired was: 04/20/2020. The wound has been in treatment 13 weeks. The wound is located on the Right,Anterior Lower Leg. The wound measures 0.2cm length x 0.3cm width x 0.1cm depth; 0.047cm^2 area and 0.005cm^3 volume. There is no tunneling or undermining noted. There is a medium amount of serosanguineous  drainage noted. The wound margin is distinct with the outline attached to the wound base. There is large (67-100%) red, pink granulation within the wound bed. There is a small (1-33%) amount of necrotic tissue within the wound bed including Adherent Slough. Wound #7R status is Open. Original cause of wound was Blister. The date acquired was: 04/20/2020. The wound has been in treatment 13 weeks. The wound is located on the Left,Anterior Lower Leg. The wound measures 1.7cm length x 1.4cm width x 0.1cm depth; 1.869cm^2 area and 0.187cm^3 volume. There is no tunneling or undermining noted. There is a medium amount of serosanguineous drainage noted. The wound margin is distinct with the outline attached to the wound base. There is medium (34-66%) red, pink granulation within the wound bed. There is a medium (34-66%) amount of necrotic tissue within the wound bed including Adherent Slough. Assessment Active Problems ICD-10 Squamous cell carcinoma of skin, unspecified Non-pressure chronic ulcer of other part of right lower leg with unspecified severity Non-pressure chronic ulcer of other part of left lower leg with unspecified severity Venous insufficiency (chronic) (peripheral) Patient had no open wounds at last clinic visit. She did a trial of juxta light compressions for 1 week. She now has 2 open wounds limited to skin breakdown. It looks like these were blisters that popped. At this time we will wrap her again in Kerlix/Coban with silver alginate. She really should have swelling control with her juxta lites especially if Kerlix/Coban worked for her. She may benefit from a diuretic. I recommended she follow-up with her primary care physician for this. Plan Follow-up Appointments: Return Appointment in 1 week. - Dr. Heber [place] Bathing/ Shower/ Hygiene: May shower with protection but do not get wound dressing(s) wet. - Use cast protector Edema Control - Lymphedema / SCD / Other: Elevate legs to the  level of the heart or above for 30 minutes daily and/or when sitting, a frequency of: - elevate the legs throughout the day heart level if possible. Avoid standing for long periods of time. Exercise regularly Additional Orders / Instructions: Follow Nutritious Diet WOUND #6R: - Lower Leg Wound Laterality: Right, Anterior Cleanser: Soap and Water 1 x Per Week/15 Days Discharge Instructions: May shower and wash wound with dial antibacterial soap and water prior to dressing change. Cleanser: Wound Cleanser 1 x Per Week/15 Days Discharge Instructions: Cleanse the wound with wound cleanser prior to applying a clean dressing using gauze sponges, not tissue or cotton balls. Peri-Wound Care: Triamcinolone 15 (g) 1 x Per Week/15 Days Discharge Instructions: In clinic only. Use triamcinolone 15 (g)  in clinic only. Peri-Wound Care: Sween Lotion (Moisturizing lotion) 1 x Per Week/15 Days Discharge Instructions: Apply moisturizing lotion as directed Prim Dressing: KerraCel Ag Gelling Fiber Dressing, 2x2 in (silver alginate) 1 x Per Week/15 Days ary Discharge Instructions: Apply silver alginate to wound bed as instructed Secondary Dressing: Woven Gauze Sponge, Non-Sterile 4x4 in 1 x Per Week/15 Days Discharge Instructions: Apply over primary dressing as directed. Secondary Dressing: ABD Pad, 8x10 1 x Per Week/15 Days Discharge Instructions: Apply over primary dressing as directed. Com pression Wrap: Kerlix Roll 4.5x3.1 (in/yd) 1 x Per Week/15 Days Discharge Instructions: Apply Kerlix and Coban compression as directed. Com pression Wrap: Coban Self-Adherent Wrap 4x5 (in/yd) 1 x Per Week/15 Days Discharge Instructions: Apply over Kerlix as directed. Com pression Stockings: Circaid Juxta Lite Compression Wrap Compression Amount: 30-40 mmHg (left) Compression Amount: 30-40 mmHg (right) Discharge Instructions: Apply Circaid Juxta Lite Compression Wrap daily as instructed. Apply first thing in the morning,  remove at night before bed. WOUND #7R: - Lower Leg Wound Laterality: Left, Anterior Cleanser: Soap and Water 1 x Per Week/15 Days Discharge Instructions: May shower and wash wound with dial antibacterial soap and water prior to dressing change. Cleanser: Wound Cleanser 1 x Per Week/15 Days Discharge Instructions: Cleanse the wound with wound cleanser prior to applying a clean dressing using gauze sponges, not tissue or cotton balls. Peri-Wound Care: Triamcinolone 15 (g) 1 x Per Week/15 Days Discharge Instructions: In clinic only. Use triamcinolone 15 (g) in clinic only. Peri-Wound Care: Sween Lotion (Moisturizing lotion) 1 x Per Week/15 Days Discharge Instructions: Apply moisturizing lotion as directed Prim Dressing: KerraCel Ag Gelling Fiber Dressing, 2x2 in (silver alginate) 1 x Per Week/15 Days ary Discharge Instructions: Apply silver alginate to wound bed as instructed Secondary Dressing: Woven Gauze Sponge, Non-Sterile 4x4 in 1 x Per Week/15 Days Discharge Instructions: Apply over primary dressing as directed. Secondary Dressing: ABD Pad, 8x10 1 x Per Week/15 Days Discharge Instructions: Apply over primary dressing as directed. Com pression Wrap: Kerlix Roll 4.5x3.1 (in/yd) 1 x Per Week/15 Days Discharge Instructions: Apply Kerlix and Coban compression as directed. Com pression Wrap: Coban Self-Adherent Wrap 4x5 (in/yd) 1 x Per Week/15 Days Discharge Instructions: Apply over Kerlix as directed. 1. Kerlix/Coban with silver alginate 2. Follow-up in 1 week Electronic Signature(s) Signed: 09/13/2020 1:50:18 PM By: Kalman Shan DO Entered By: Kalman Shan on 09/13/2020 13:49:42 -------------------------------------------------------------------------------- HxROS Details Patient Name: Date of Service: [name], [name]. 09/13/2020 1:30 PM Medical Record Number: [id number] Patient Account Number: [id number] Date of Birth/Sex: Treating RN: 04/12/1925 (87 y.o. [name] Primary Care Provider: [name] Other Clinician: Referring Provider: Treating Provider/Extender: [name] in Treatment: 13 Information Obtained From Patient Eyes Medical History: Negative for: Cataracts; Optic Neuritis Ear/Nose/Mouth/Throat Medical History: Negative for: Chronic sinus problems/congestion; Middle ear problems Hematologic/Lymphatic Medical History: Positive for: Anemia Negative for: Hemophilia; Human Immunodeficiency Virus; Lymphedema; Sickle Cell Disease Respiratory Medical History: Negative for: Aspiration; Asthma; Chronic Obstructive Pulmonary Disease (COPD); Pneumothorax; Sleep Apnea; Tuberculosis Cardiovascular Medical History: Positive for: Arrhythmia - Atrial Flutter, A fibb; Congestive Heart Failure; Hypertension Negative for: Angina; Coronary Artery Disease; Deep Vein Thrombosis; Hypotension; Myocardial Infarction; Peripheral Arterial Disease; Peripheral Venous Disease; Phlebitis; Vasculitis Past Medical History Notes: hyperlipidemia Gastrointestinal Medical History: Positive for: Colitis Negative for: Cirrhosis ; Crohns; Hepatitis A; Hepatitis B; Hepatitis C Endocrine Medical History: Negative for: Type I Diabetes; Type II Diabetes Past Medical History Notes: hypothyroidism Genitourinary Medical History: Negative for: End Stage Renal  Disease Immunological Medical History: Negative for: Lupus Erythematosus; Raynauds; Scleroderma Integumentary (Skin) Medical History: Negative for: History of Burn Musculoskeletal Medical History: Positive for: Osteoarthritis Negative for: Gout; Rheumatoid Arthritis; Osteomyelitis Neurologic Medical History: Negative for: Dementia; Neuropathy; Quadriplegia; Paraplegia; Seizure Disorder Past Medical History Notes: lumbar spindylolysis Oncologic Medical History: Negative for: Received Chemotherapy; Received Radiation Past Medical History Notes: BLE  squamous ceel carcionoma Immunizations Pneumococcal Vaccine: Received Pneumococcal Vaccination: Yes Received Pneumococcal Vaccination On or After 60th Birthday: No Implantable Devices None Hospitalization / Surgery History Type of Hospitalization/Surgery removal of rod left leg Family and Social History Unknown History: Yes; Never smoker; Marital Status - Single; Alcohol Use: Never; Drug Use: No History; Caffeine Use: Never; Financial Concerns: No; Food, Clothing or Shelter Needs: No; Support System Lacking: No; Transportation Concerns: No Electronic Signature(s) Signed: 09/13/2020 1:50:18 PM By: Kalman Shan DO Signed: 09/18/2020 5:24:19 PM By: Rhae Hammock RN Entered By: Kalman Shan on 09/13/2020 13:45:51 -------------------------------------------------------------------------------- SuperBill Details Patient Name: Date of Service: [name], [name] 09/13/2020 Medical Record Number: [id number] Patient Account Number: [id number] Date of Birth/Sex: Treating RN: 1925-12-25 (87 y.o. 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 L97.829 Non-pressure chronic ulcer of other part of left lower leg with unspecified severity I87.2 Venous insufficiency (chronic) (peripheral) Facility Procedures CPT4 Code: [id number] Description: [phone] - WOUND CARE VISIT-LEV 4 NEW PT Modifier: Quantity: 1 Physician Procedures : CPT4 Code Description Modifier [id number] - WC PHYS LEVEL 3 - EST PT ICD-10 Diagnosis Description L97.819 Non-pressure chronic ulcer of other part of right lower leg with unspecified severity L97.829 Non-pressure chronic ulcer of  other part of left  lower leg with unspecified severity C44.92 Squamous cell carcinoma of skin, unspecified I87.2 Venous insufficiency (chronic) (peripheral) Quantity: 1 Electronic Signature(s) Signed: 09/13/2020 2:41:52 PM By: Kalman Shan DO Signed: 09/18/2020 5:24:19 PM By: Rhae Hammock RN Previous Signature: 09/13/2020 1:50:18 PM Version By: Kalman Shan DO Entered By: Rhae Hammock on 09/13/2020 14:10:27

## 2020-09-18 NOTE — Progress Notes (Signed)
[name] ([id number]) , Visit Report for 09/13/2020 Arrival Information Details Patient Name: Date of Service: [name], [name] 09/13/2020 1:30 PM Medical Record Number: [id number] Patient Account Number: [id number] Date of Birth/Sex: Treating RN: [birth year] (87 y.o. [name], [name] Primary Care : [name] Other Clinician: Referring : Treating /Extender: [name] in Treatment: 53 Visit Information History Since Last Visit Added or deleted any medications: No Patient Arrived: Walker Any new allergies or adverse reactions: No Arrival Time: 13:21 Had a fall or experienced change in No Accompanied By: self activities of daily living that may affect Transfer Assistance: None risk of falls: Patient Identification Verified: Yes Signs or symptoms of abuse/neglect since last visito No Secondary Verification Process Completed: Yes Hospitalized since last visit: No Patient Requires Transmission-Based No Implantable device outside of the clinic excluding No Precautions: cellular tissue based products placed in the center Patient Has Alerts: Yes since last visit: Patient Alerts: R ABI = Non compressible Has Dressing in Place as Prescribed: Yes L ABI = Non compressible Pain Present Now: No Electronic Signature(s) Signed: 09/18/2020 5:24:19 PM By: Rhae Hammock RN Entered By: Rhae Hammock on 09/13/2020 13:21:43 -------------------------------------------------------------------------------- Clinic Level of Care Assessment Details Patient Name: Date of Service: [name], [name] 09/13/2020 1:30 PM Medical Record Number: [id number] Patient Account Number: [id number] Date of Birth/Sex: Treating RN: [birth year] (87 y.o. [name], [name] Primary Care : [name] Other Clinician: Referring : Treating /Extender: [name] in  Treatment: 13 Clinic Level of Care Assessment Items TOOL 4 Quantity Score X- 1 0 Use when only an EandM is performed on FOLLOW-UP visit ASSESSMENTS - Nursing Assessment / Reassessment X- 1 10 Reassessment of Co-morbidities (includes updates in patient status) X- 1 5 Reassessment of Adherence to Treatment Plan ASSESSMENTS - Wound and Skin A ssessment / Reassessment [] - 0 Simple Wound Assessment / Reassessment - one wound X- 2 5 Complex Wound Assessment / Reassessment - multiple wounds [] - 0 Dermatologic / Skin Assessment (not related to wound area) ASSESSMENTS - Focused Assessment X- 1 5 Circumferential Edema Measurements - multi extremities [] - 0 Nutritional Assessment / Counseling / Intervention [] - 0 Lower Extremity Assessment (monofilament, tuning fork, pulses) [] - 0 Peripheral Arterial Disease Assessment (using hand held doppler) ASSESSMENTS - Ostomy and/or Continence Assessment and Care [] - 0 Incontinence Assessment and Management [] - 0 Ostomy Care Assessment and Management (repouching, etc.) PROCESS - Coordination of Care [] - 0 Simple Patient / Family Education for ongoing care X- 1 20 Complex (extensive) Patient / Family Education for ongoing care X- 1 10 Staff obtains Programmer, systems, Records, T Results / Process Orders est [] - 0 Staff telephones HHA, Nursing Homes / Clarify orders / etc [] - 0 Routine Transfer to another Facility (non-emergent condition) [] - 0 Routine Hospital Admission (non-emergent condition) [] - 0 New Admissions / Biomedical engineer / Ordering NPWT Apligraf, etc. , [] - 0 Emergency Hospital Admission (emergent condition) X- 1 10 Simple Discharge Coordination [] - 0 Complex (extensive) Discharge Coordination PROCESS - Special Needs [] - 0 Pediatric / Minor Patient Management [] - 0 Isolation Patient Management [] - 0 Hearing / Language / Visual special needs [] - 0 Assessment of Community assistance (transportation,  D/C planning, etc.) [] - 0 Additional assistance / Altered mentation [] - 0 Support Surface(s) Assessment (bed, cushion, seat, etc.) INTERVENTIONS - Wound Cleansing / Measurement [] - 0 Simple Wound  Cleansing - one wound X- 2 5 Complex Wound Cleansing - multiple wounds X- 1 5 Wound Imaging (photographs - any number of wounds) [] - 0 Wound Tracing (instead of photographs) [] - 0 Simple Wound Measurement - one wound X- 2 5 Complex Wound Measurement - multiple wounds INTERVENTIONS - Wound Dressings [] - 0 Small Wound Dressing one or multiple wounds X- 2 15 Medium Wound Dressing one or multiple wounds [] - 0 Large Wound Dressing one or multiple wounds X- 1 5 Application of Medications - topical [] - 0 Application of Medications - injection INTERVENTIONS - Miscellaneous [] - 0 External ear exam [] - 0 Specimen Collection (cultures, biopsies, blood, body fluids, etc.) [] - 0 Specimen(s) / Culture(s) sent or taken to Lab for analysis [] - 0 Patient Transfer (multiple staff / Civil Service fast streamer / Similar devices) [] - 0 Simple Staple / Suture removal (25 or less) [] - 0 Complex Staple / Suture removal (26 or more) [] - 0 Hypo / Hyperglycemic Management (close monitor of Blood Glucose) [] - 0 Ankle / Brachial Index (ABI) - do not check if billed separately X- 1 5 Vital Signs Has the patient been seen at the hospital within the last three years: Yes Total Score: 135 Level Of Care: New/Established - Level 4 Electronic Signature(s) Signed: 09/18/2020 5:24:19 PM By: Rhae Hammock RN Entered By: Rhae Hammock on 09/13/2020 14:10:13 -------------------------------------------------------------------------------- Encounter Discharge Information Details Patient Name: Date of Service: [name], [name] 09/13/2020 1:30 PM Medical Record Number: [id number] Patient Account Number: [id number] Date of Birth/Sex: Treating RN: [birth year] (87 y.o. [name], [name] Primary Care  : [name] Other Clinician: Referring : Treating /Extender: [name] in Treatment: 52 Encounter Discharge Information Items Discharge Condition: Stable Ambulatory Status: Walker Discharge Destination: Home Transportation: Private Auto Accompanied By: self Schedule Follow-up Appointment: Yes Clinical Summary of Care: Patient Declined Electronic Signature(s) Signed: 09/18/2020 5:24:19 PM By: Rhae Hammock RN Entered By: Rhae Hammock on 09/13/2020 14:13:22 -------------------------------------------------------------------------------- Lower Extremity Assessment Details Patient Name: Date of Service: [name], Devany A. 09/13/2020 1:30 PM Medical Record Number: [id number] Patient Account Number: [id number] Date of Birth/Sex: Treating RN: 1925/12/24 (87 y.o. [name], [name] Primary Care : [name] Other Clinician: Referring : Treating /Extender: [name] in Treatment: 13 Edema Assessment Assessed: [Left: Yes] [place]rice [place]: Yes] Edema: [Left: Yes] [Right: Yes] Calf Left: Right: Point of Measurement: 30 cm From Medial Instep 42 cm 42.5 cm Ankle Left: Right: Point of Measurement: 9 cm From Medial Instep 25 cm 25.5 cm Vascular Assessment Pulses: Dorsalis Pedis Palpable: [Left:Yes] [Right:Yes] Posterior Tibial Palpable: [Left:Yes] [Right:Yes] Electronic Signature(s) Signed: 09/18/2020 5:24:19 PM By: Rhae Hammock RN Entered By: Rhae Hammock on 09/13/2020 13:22:18 -------------------------------------------------------------------------------- Multi Wound Chart Details Patient Name: Date of Service: [name], Markan A. 09/13/2020 1:30 PM Medical Record Number: [id number] Patient Account Number: [id number] Date of Birth/Sex: Treating RN: 1925/06/20 (87 y.o. [name], [name] Primary Care : [name] Other  Clinician: Referring : Treating /Extender: [name] in Treatment: 13 Vital Signs Height(in): 65 Pulse(bpm): 45 Weight(lbs): 163 Blood Pressure(mmHg): 129/73 Body Mass Index(BMI): 27 Temperature(F): 97.8 Respiratory Rate(breaths/min): 17 Photos: [6R:No Photos Right, Anterior Lower Leg] [7R:No Photos Left, Anterior Lower Leg] [N/A:N/A N/A] Wound Location: [6R:Blister] [7R:Blister] [N/A:N/A] Wounding Event: [6R:Malignant Wound] [7R:Malignant Wound] [N/A:N/A] Primary Etiology: [6R:Anemia, Arrhythmia, Congestive Heart Anemia, Arrhythmia, Congestive Heart N/A] Comorbid History: [6R:Failure, Hypertension, Colitis, Osteoarthritis 04/20/2020] [7R:Failure, Hypertension, Colitis,  Osteoarthritis 04/20/2020] [N/A:N/A] Date Acquired: [6R:13] [7R:13] [N/A:N/A] Weeks of Treatment: [6R:Open] [7R:Open] [N/A:N/A] Wound Status: [6R:Yes] [7R:Yes] [N/A:N/A] Wound Recurrence: [6R:0.2x0.3x0.1] [7R:1.7x1.4x0.1] [N/A:N/A] Measurements L x W x D (cm) [6R:0.047] [7R:1.869] [N/A:N/A] A (cm) : rea [6R:0.005] [7R:0.187] [N/A:N/A] Volume (cm) : [6R:99.30%] [7R:39.10%] [N/A:N/A] % Reduction in Area: [6R:99.70%] [7R:39.10%] [N/A:N/A] % Reduction in Volume: [6R:Full Thickness Without Exposed] [7R:Full Thickness Without Exposed] [N/A:N/A] Classification: [6R:Support Structures Medium] [7R:Support Structures Medium] [N/A:N/A] Exudate Amount: [6R:Serosanguineous] [7R:Serosanguineous] [N/A:N/A] Exudate Type: [6R:red, brown] [7R:red, brown] [N/A:N/A] Exudate Color: [6R:Distinct, outline attached] [7R:Distinct, outline attached] [N/A:N/A] Wound Margin: [6R:Large (67-100%)] [7R:Medium (34-66%)] [N/A:N/A] Granulation Amount: [6R:Red, Pink] [7R:Red, Pink] [N/A:N/A] Granulation Quality: [6R:Small (1-33%)] [7R:Medium (34-66%)] [N/A:N/A] Necrotic Amount: [6R:Fascia: No] [7R:Fascia: No] [N/A:N/A] Exposed Structures: [6R:Fat Layer (Subcutaneous Tissue): No Fat Layer  (Subcutaneous Tissue): No Tendon: No Muscle: No Joint: No Bone: No None] [7R:Tendon: No Muscle: No Joint: No Bone: No None] [N/A:N/A] Treatment Notes Electronic Signature(s) Signed: 09/13/2020 1:50:18 PM By: Kalman Shan DO Signed: 09/18/2020 5:24:19 PM By: Rhae Hammock RN Entered By: Kalman Shan on 09/13/2020 13:44:31 -------------------------------------------------------------------------------- Multi-Disciplinary Care Plan Details Patient Name: Date of Service: [name], Junelle A. 09/13/2020 1:30 PM Medical Record Number: [id number] Patient Account Number: [id number] Date of Birth/Sex: Treating RN: 15-Jul-1925 (87 y.o. [name], [name] Primary Care Revanth Neidig: [name] Other Clinician: Referring Kostantinos Tallman: Treating Ahmod Gillespie/Extender: [name] in Treatment: [place] reviewed with physician Active Inactive Malignancy/Atypical Etiology Nursing Diagnoses: Knowledge deficit related to disease process and management of malignancy Goals: Patient/caregiver will verbalize understanding of disease process and disease management of atypical ulcer etiology Date Initiated: 06/11/2020 Date Inactivated: 07/11/2020 Target Resolution Date: 07/06/2020 Goal Status: Met Patient/caregiver will verbalize understanding of disease process and disease management of malignancy Date Initiated: 06/11/2020 Target Resolution Date: 10/06/2020 Goal Status: Active Interventions: Assess patient and family medical history for signs and symptoms of malignancy/atypical etiology upon admission Provide education on atypical ulcer etiologies Provide education on malignant ulcerations Notes: Wound/Skin Impairment Nursing Diagnoses: Impaired tissue integrity Knowledge deficit related to ulceration/compromised skin integrity Goals: Patient/caregiver will verbalize understanding of skin care regimen Date Initiated: 06/11/2020 Target  Resolution Date: 10/13/2020 Goal Status: Active Interventions: Assess patient/caregiver ability to obtain necessary supplies Assess patient/caregiver ability to perform ulcer/skin care regimen upon admission and as needed Assess ulceration(s) every visit Provide education on ulcer and skin care Notes: Electronic Signature(s) Signed: 09/18/2020 5:24:19 PM By: Rhae Hammock RN Entered By: Rhae Hammock on 09/13/2020 13:41:38 -------------------------------------------------------------------------------- Pain Assessment Details Patient Name: Date of Service: [name], Tove A. 09/13/2020 1:30 PM Medical Record Number: [id number] Patient Account Number: [id number] Date of Birth/Sex: Treating RN: 10-07-1925 (86 y.o. [name], [name] Primary Care Evadean Sproule: [name] Other Clinician: Referring Diangelo Radel: Treating Sharell Hilmer/Extender: [name] in Treatment: 69 Active Problems Location of Pain Severity and Description of Pain Patient Has Paino No Site Locations Pain Management and Medication Current Pain Management: Electronic Signature(s) Signed: 09/18/2020 5:24:19 PM By: Rhae Hammock RN Entered By: Rhae Hammock on 09/13/2020 13:22:08 -------------------------------------------------------------------------------- Patient/Caregiver Education Details Patient Name: Date of Service: [name], Leonna A. 8/25/2022andnbsp1:30 PM Medical Record Number: [id number] Patient Account Number: [id number] Date of Birth/Gender: Treating RN: 1925-12-08 (87 y.o. [name], [name] Primary Care Physician: [name] Other Clinician: Referring Physician: Treating Physician/Extender: [name] in Treatment: 13 Education Assessment Education Provided To: Patient Education Topics Provided Malignant/Atypical Wounds: Methods: Explain/Verbal Responses: State content correctly Wound/Skin  Impairment: Methods: Explain/Verbal Responses: State content  correctly Electronic Signature(s) Signed: 09/18/2020 5:24:19 PM By: Rhae Hammock RN Signed: 09/18/2020 5:24:19 PM By: Rhae Hammock RN Entered By: Rhae Hammock on 09/13/2020 13:43:07 -------------------------------------------------------------------------------- Wound Assessment Details Patient Name: Date of Service: [name], [place] 09/13/2020 1:30 PM Medical Record Number: [id number] Patient Account Number: [id number] Date of Birth/Sex: Treating RN: Apr 12, 1925 (87 y.o. [name], [name] Primary Care Tamorah Hada: [name] Other Clinician: Referring Donta Mcinroy: Treating Michael Ventresca/Extender: [name] in Treatment: 13 Wound Status Wound Number: 6R Primary Malignant Wound Etiology: Wound Location: Right, Anterior Lower Leg Wound Open Wounding Event: Blister Status: Date Acquired: 04/20/2020 Comorbid Anemia, Arrhythmia, Congestive Heart Failure, Hypertension, Weeks Of Treatment: 13 History: Colitis, Osteoarthritis Clustered Wound: No Photos Photo Uploaded By: Donavan Burnet on 09/14/2020 16:58:42 Wound Measurements Length: (cm) 0.2 Width: (cm) 0.3 Depth: (cm) 0.1 Area: (cm) 0.047 Volume: (cm) 0.005 % Reduction in Area: 99.3% % Reduction in Volume: 99.7% Epithelialization: None Tunneling: No Undermining: No Wound Description Classification: Full Thickness Without Exposed Support Structures Wound Margin: Distinct, outline attached Exudate Amount: Medium Exudate Type: Serosanguineous Exudate Color: red, brown Foul Odor After Cleansing: No Slough/Fibrino Yes Wound Bed Granulation Amount: Large (67-100%) Exposed Structure Granulation Quality: Red, Pink Fascia Exposed: No Necrotic Amount: Small (1-33%) Fat Layer (Subcutaneous Tissue) Exposed: No Necrotic Quality: Adherent Slough Tendon Exposed: No Muscle Exposed: No Joint Exposed: No Bone  Exposed: No Treatment Notes Wound #6R (Lower Leg) Wound Laterality: Right, Anterior Cleanser Soap and Water Discharge Instruction: May shower and wash wound with dial antibacterial soap and water prior to dressing change. Wound Cleanser Discharge Instruction: Cleanse the wound with wound cleanser prior to applying a clean dressing using gauze sponges, not tissue or cotton balls. Peri-Wound Care Triamcinolone 15 (g) Discharge Instruction: In clinic only. Use triamcinolone 15 (g) in clinic only. Sween Lotion (Moisturizing lotion) Discharge Instruction: Apply moisturizing lotion as directed Topical Primary Dressing KerraCel Ag Gelling Fiber Dressing, 2x2 in (silver alginate) Discharge Instruction: Apply silver alginate to wound bed as instructed Secondary Dressing Woven Gauze Sponge, Non-Sterile 4x4 in Discharge Instruction: Apply over primary dressing as directed. ABD Pad, 8x10 Discharge Instruction: Apply over primary dressing as directed. Secured With Compression Wrap Kerlix Roll 4.5x3.1 (in/yd) Discharge Instruction: Apply Kerlix and Coban compression as directed. Coban Self-Adherent Wrap 4x5 (in/yd) Discharge Instruction: Apply over Kerlix as directed. Compression Stockings Circaid Juxta Lite Compression Wrap Quantity: 1 Left Leg Compression Amount: 30-40 mmHg Right Leg Compression Amount: 30-40 mmHg Discharge Instruction: Apply Circaid Juxta Lite Compression Wrap daily as instructed. Apply first thing in the morning, remove at night before bed. Add-Ons Electronic Signature(s) Signed: 09/18/2020 5:24:19 PM By: Rhae Hammock RN Entered By: Rhae Hammock on 09/13/2020 13:34:40 -------------------------------------------------------------------------------- Wound Assessment Details Patient Name: Date of Service: [name], [place] 09/13/2020 1:30 PM Medical Record Number: [id number] Patient Account Number: [id number] Date of Birth/Sex: Treating RN: [birth year] (87 y.o.  [name], [name] Primary Care Mikle Sternberg: [name] Other Clinician: Referring Terrilynn Postell: Treating Lucifer Soja/Extender: [name] in Treatment: 13 Wound Status Wound Number: 7R Primary Malignant Wound Etiology: Wound Location: Left, Anterior Lower Leg Wound Open Wounding Event: Blister Status: Date Acquired: 04/20/2020 Comorbid Anemia, Arrhythmia, Congestive Heart Failure, Hypertension, Weeks Of Treatment: 13 History: Colitis, Osteoarthritis Clustered Wound: No Photos Photo Uploaded By: Donavan Burnet on 09/14/2020 16:58:42 Wound Measurements Length: (cm) 1.7 Width: (cm) 1.4 Depth: (cm) 0.1 Area: (cm) 1.869 Volume: (cm) 0.187 % Reduction in Area: 39.1% % Reduction in Volume: 39.1% Epithelialization: None Tunneling: No Undermining: No  Wound Description Classification: Full Thickness Without Exposed Support Structures Wound Margin: Distinct, outline attached Exudate Amount: Medium Exudate Type: Serosanguineous Exudate Color: red, brown Foul Odor After Cleansing: No Slough/Fibrino Yes Wound Bed Granulation Amount: Medium (34-66%) Exposed Structure Granulation Quality: Red, Pink Fascia Exposed: No Necrotic Amount: Medium (34-66%) Fat Layer (Subcutaneous Tissue) Exposed: No Necrotic Quality: Adherent Slough Tendon Exposed: No Muscle Exposed: No Joint Exposed: No Bone Exposed: No Treatment Notes Wound #7R (Lower Leg) Wound Laterality: Left, Anterior Cleanser Soap and Water Discharge Instruction: May shower and wash wound with dial antibacterial soap and water prior to dressing change. Wound Cleanser Discharge Instruction: Cleanse the wound with wound cleanser prior to applying a clean dressing using gauze sponges, not tissue or cotton balls. Peri-Wound Care Triamcinolone 15 (g) Discharge Instruction: In clinic only. Use triamcinolone 15 (g) in clinic only. Sween Lotion (Moisturizing lotion) Discharge Instruction:  Apply moisturizing lotion as directed Topical Primary Dressing KerraCel Ag Gelling Fiber Dressing, 2x2 in (silver alginate) Discharge Instruction: Apply silver alginate to wound bed as instructed Secondary Dressing Woven Gauze Sponge, Non-Sterile 4x4 in Discharge Instruction: Apply over primary dressing as directed. ABD Pad, 8x10 Discharge Instruction: Apply over primary dressing as directed. Secured With Compression Wrap Kerlix Roll 4.5x3.1 (in/yd) Discharge Instruction: Apply Kerlix and Coban compression as directed. Coban Self-Adherent Wrap 4x5 (in/yd) Discharge Instruction: Apply over Kerlix as directed. Compression Stockings Add-Ons Electronic Signature(s) Signed: 09/18/2020 5:24:19 PM By: Rhae Hammock RN Entered By: Rhae Hammock on 09/13/2020 13:30:40 -------------------------------------------------------------------------------- Vitals Details Patient Name: Date of Service: [name], Geraline A. 09/13/2020 1:30 PM Medical Record Number: [id number] Patient Account Number: [id number] Date of Birth/Sex: Treating RN: [birth year] (87 y.o. [name], [name] Primary Care : [name] Other Clinician: Referring : Treating /Extender: [name] in Treatment: 13 Vital Signs Time Taken: 13:21 Temperature (F): 97.8 Height (in): 65 Pulse (bpm): 90 Weight (lbs): 163 Respiratory Rate (breaths/min): 17 Body Mass Index (BMI): 27.1 Blood Pressure (mmHg): 129/73 Reference Range: 80 - 120 mg / dl Electronic Signature(s) Signed: 09/18/2020 5:24:19 PM By: Rhae Hammock RN Entered By: Rhae Hammock on 09/13/2020 13:22:01

## 2020-09-20 ENCOUNTER — Other Ambulatory Visit: Payer: Self-pay

## 2020-09-20 ENCOUNTER — Encounter (HOSPITAL_BASED_OUTPATIENT_CLINIC_OR_DEPARTMENT_OTHER): Payer: Medicare Other | Attending: Internal Medicine | Admitting: Internal Medicine

## 2020-09-20 DIAGNOSIS — L97819 Non-pressure chronic ulcer of other part of right lower leg with unspecified severity: Secondary | ICD-10-CM | POA: Insufficient documentation

## 2020-09-20 DIAGNOSIS — Z853 Personal history of malignant neoplasm of breast: Secondary | ICD-10-CM | POA: Insufficient documentation

## 2020-09-20 DIAGNOSIS — C4492 Squamous cell carcinoma of skin, unspecified: Secondary | ICD-10-CM

## 2020-09-20 DIAGNOSIS — I872 Venous insufficiency (chronic) (peripheral): Secondary | ICD-10-CM | POA: Diagnosis not present

## 2020-09-20 DIAGNOSIS — Z85828 Personal history of other malignant neoplasm of skin: Secondary | ICD-10-CM | POA: Diagnosis not present

## 2020-09-20 DIAGNOSIS — L97829 Non-pressure chronic ulcer of other part of left lower leg with unspecified severity: Secondary | ICD-10-CM | POA: Insufficient documentation

## 2020-09-20 NOTE — Progress Notes (Signed)
[name] ([id number]) , Visit Report for 09/20/2020 Chief Complaint Document Details Patient Name: Date of Service: [name], [name] 09/20/2020 1:00 PM Medical Record Number: [id number] Patient Account Number: [id number] Date of Birth/Sex: Treating RN: 20-Oct-1925 (87 y.o. [name], [name] Primary Care Provider: [name] Other Clinician: Referring Provider: Treating Provider/Extender: [name] in Treatment: 14 Information Obtained from: Patient Chief Complaint Bilateral lower extremity wounds that have been biopsied and positive for squamous cell carcinoma Electronic Signature(s) Signed: 09/20/2020 2:18:03 PM By: Kalman Shan DO Entered By: Kalman Shan on 09/20/2020 14:13:11 -------------------------------------------------------------------------------- HPI Details Patient Name: Date of Service: [name], [name] 09/20/2020 1:00 PM Medical Record Number: [id number] Patient Account Number: [id number] Date of Birth/Sex: Treating RN: 11-Jul-1925 (87 y.o. [name] Primary Care Provider: [name] Other Clinician: Referring Provider: Treating Provider/Extender: [name] in Treatment: 14 History of Present Illness Location: left leg HPI Description: Admission 5/23 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 She states that the wound on the Bell has been present for a year. The 1 on the left has opened up 1 month ago. She is followed with oncology for this issue as she had biopsies that showed squamous cell carcinoma. She is also seeing radiation oncology for treatment options. She presents today because she would like for her wounds to  be healed by [place]. She currently denies signs of infection. 6/1; patient presents for 1 week follow-up. She states she has tolerated the leg wraps well. She states these do not bother her and is happy to continue with them. She is scheduled to see her oncologist today to go over treatment options for the bilateral lower extremity squamous cell carcinoma. Radiation is currently not a recommended option. Patient states she overall feels well. 6/22; patient presents for 3-week follow-up. She has tolerated the wraps well until her last wrap where she states they were uncomfortable. She attributes this to the home health nurse. She denies signs of infection. She has started her first treatment of antibody infusions for her Bilateral lower extremity squamous cell carcinoma. She has no complaints today. 7/21; patient presents for 1 month follow-up. Unfortunately she has not had good experience with her wrap changes with home health. She would like to do her own dressing changes. She continues to do her antibody infusions. She denies signs of infection. 7/28; patient presents for 1 week follow-up. At last clinic visit she was switched to daily dressing changes due to issues with the wrap and home health placing them. Unfortunately she has developed weeping to her legs bilaterally. She would like to be placed in wraps today. She would also like to follow with [place] weekly for wrap changes instead of having home health change them. She denies signs of infection. 8/4; patient presents for 1 week follow-up. She has tolerated the Kerlix/Coban wraps well. She no longer has weeping to her legs. She took the wrap off 1 day before coming in to be able to take a shower. She has no issues or complaints today. She denies signs of infection. 8/18; patient presents for follow-up. Patient has tolerated the wraps well. She brought her Velcro compression wraps today. She has no issues or complaints today. She had her chemotherapy  infusion  yesterday without issues. She denies signs of infection. 8/25; patient presents for follow-up. She used her juxta lite compressions for the past week. It is unclear if she is able to put these on correctly since she states she has a hard time getting them to look Bell. She reports 2 open wounds. She currently denies signs of infection. 9/1; patient presents for 1 week follow-up. She has 1 open wound. She tolerated the compression wraps well. She currently denies signs of infection. Electronic Signature(s) Signed: 09/20/2020 2:18:03 PM By: Kalman Shan DO Entered By: Kalman Shan on 09/20/2020 14:13:41 -------------------------------------------------------------------------------- Physical Exam Details Patient Name: Date of Service: [name], [name] 09/20/2020 1:00 PM Medical Record Number: [id number] Patient Account Number: [id number] Date of Birth/Sex: Treating RN: 05/21/1925 (87 y.o. [name], [name] Primary Care Provider: [name] Other Clinician: Referring Provider: Treating Provider/Extender: [name] in Treatment: 14 Constitutional respirations regular, non-labored and within target range for patient.. Cardiovascular 2+ dorsalis pedis/posterior tibialis pulses. Psychiatric pleasant and cooperative. Notes Patient now has 1 open wound limited to skin breakdown on her left lower extremity. Epithelialization to previous wound site on the Bell leg. Good edema control bilaterally. No signs of infection. No weeping to her legs. Electronic Signature(s) Signed: 09/20/2020 2:18:03 PM By: Kalman Shan DO Entered By: Kalman Shan on 09/20/2020 14:14:40 -------------------------------------------------------------------------------- Physician Orders Details Patient Name: Date of Service: [name], [place]. 09/20/2020 1:00 PM Medical Record Number: [id number] Patient Account Number: [id number] Date of Birth/Sex: Treating  RN: Jan 16, 1926 (87 y.o. 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 - Dr. Heber Bell Return A Bathing/ Shower/ Hygiene May shower with protection but do not get wound dressing(s) wet. - Use cast protector Edema Control - Lymphedema / SCD / Other Elevate legs to the level of the heart or above for 30 minutes daily and/or when sitting, a frequency of: - elevate the legs throughout the day heart level if possible. Avoid standing for long periods of time. Exercise regularly Additional Orders / Instructions Follow Nutritious Diet Wound Treatment Wound #6R - Lower Leg Wound Laterality: Bell, Anterior Cleanser: Soap and Water 1 x Per Week/15 Days Discharge Instructions: May shower and wash wound with dial antibacterial soap and water prior to dressing change. Cleanser: Wound Cleanser 1 x Per Week/15 Days Discharge Instructions: Cleanse the wound with wound cleanser prior to applying a clean dressing using gauze sponges, not tissue or cotton balls. Peri-Wound Care: Triamcinolone 15 (g) 1 x Per Week/15 Days Discharge Instructions: In clinic only. Use triamcinolone 15 (g) in clinic only. Peri-Wound Care: Sween Lotion (Moisturizing lotion) 1 x Per Week/15 Days Discharge Instructions: Apply moisturizing lotion as directed Prim Dressing: PolyMem Silver Non-Adhesive Dressing, 4.25x4.25 in 1 x Per Week/15 Days ary Discharge Instructions: Apply to  wound bed as instructed Secondary Dressing: Woven Gauze Sponge, Non-Sterile 4x4 in 1 x Per Week/15 Days Discharge Instructions: Apply over primary dressing as directed. Secondary Dressing: ABD Pad, 8x10 1 x Per Week/15 Days Discharge Instructions: Apply over primary dressing as directed. Compression Wrap: Kerlix Roll 4.5x3.1 (in/yd) 1 x  Per Week/15 Days Discharge Instructions: Apply Kerlix and Coban compression as directed. Compression Wrap: Coban Self-Adherent Wrap 4x5 (in/yd) 1 x Per Week/15 Days Discharge Instructions: Apply over Kerlix as directed. Compression Stockings: Circaid Juxta Lite Compression Wrap Left Leg Compression Amount: 30-40 mmHG Bell Leg Compression Amount: 30-40 mmHG Discharge Instructions: Apply Circaid Juxta Lite Compression Wrap daily as instructed. Apply first thing in the morning, remove at night before bed. Wound #7R - Lower Leg Wound Laterality: Left, Anterior Cleanser: Soap and Water 1 x Per Week/15 Days Discharge Instructions: May shower and wash wound with dial antibacterial soap and water prior to dressing change. Cleanser: Wound Cleanser 1 x Per Week/15 Days Discharge Instructions: Cleanse the wound with wound cleanser prior to applying a clean dressing using gauze sponges, not tissue or cotton balls. Peri-Wound Care: Triamcinolone 15 (g) 1 x Per Week/15 Days Discharge Instructions: In clinic only. Use triamcinolone 15 (g) in clinic only. Peri-Wound Care: Sween Lotion (Moisturizing lotion) 1 x Per Week/15 Days Discharge Instructions: Apply moisturizing lotion as directed Prim Dressing: PolyMem Silver Non-Adhesive Dressing, 4.25x4.25 in 1 x Per Week/15 Days ary Discharge Instructions: Apply to wound bed as instructed Secondary Dressing: Woven Gauze Sponge, Non-Sterile 4x4 in 1 x Per Week/15 Days Discharge Instructions: Apply over primary dressing as directed. Secondary Dressing: ABD Pad, 8x10 1 x Per Week/15 Days Discharge Instructions: Apply over  primary dressing as directed. Compression Wrap: Kerlix Roll 4.5x3.1 (in/yd) 1 x Per Week/15 Days Discharge Instructions: Apply Kerlix and Coban compression as directed. Compression Wrap: Coban Self-Adherent Wrap 4x5 (in/yd) 1 x Per Week/15 Days Discharge Instructions: Apply over Kerlix as directed. Electronic Signature(s) Signed: 09/20/2020 2:18:03 PM By: Kalman Shan DO Entered By: Kalman Shan on 09/20/2020 14:14:57 -------------------------------------------------------------------------------- Problem List Details Patient Name: Date of Service: [name], [name] 09/20/2020 1:00 PM Medical Record Number: [id number] Patient Account Number: [id number] Date of Birth/Sex: Treating RN: [birth year] (87 y.o. [name], [name] Primary Care Provider: [name] Other Clinician: Referring Provider: Treating Provider/Extender: [name] in Treatment: 68 Active Problems ICD-10 Encounter Code Description Active Date MDM Diagnosis C44.92 Squamous cell carcinoma of skin, unspecified 06/11/2020 No Yes L97.819 Non-pressure chronic ulcer of other part of Bell lower leg with unspecified 06/11/2020 No Yes severity L97.829 Non-pressure chronic ulcer of other part of left lower leg with unspecified 06/11/2020 No Yes severity I87.2 Venous insufficiency (chronic) (peripheral) 06/11/2020 No Yes Inactive Problems Resolved Problems Electronic Signature(s) Signed: 09/20/2020 2:18:03 PM By: Kalman Shan DO Entered By: Kalman Shan on 09/20/2020 14:12:54 -------------------------------------------------------------------------------- Progress Note Details Patient Name: Date of Service: [name], [name] 09/20/2020 1:00 PM Medical Record Number: [id number] Patient Account Number: [id number] Date of Birth/Sex: Treating RN: 10-Jun-1925 (87 y.o. [name] Primary Care Provider: [name] Other Clinician: Referring  Provider: Treating Provider/Extender: [name] in Treatment: 14 Subjective Chief Complaint Information obtained from Patient Bilateral lower extremity wounds that have been biopsied and positive for squamous cell carcinoma History of Present Illness (HPI) The following HPI elements were documented for the patient's wound: Location: left leg Admission 5/23 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 She states that the wound on the Bell has been present for a year. The 1 on the left has opened up 1 month ago.  She is followed with oncology for this issue as she had biopsies that showed squamous cell carcinoma. She is also seeing radiation oncology for treatment options. She presents today because she would like for her wounds to be healed by [place]. She currently denies signs of infection. 6/1; patient presents for 1 week follow-up. She states she has tolerated the leg wraps well. She states these do not bother her and is happy to continue with them. She is scheduled to see her oncologist today to go over treatment options for the bilateral lower extremity squamous cell carcinoma. Radiation is currently not a recommended option. Patient states she overall feels well. 6/22; patient presents for 3-week follow-up. She has tolerated the wraps well until her last wrap where she states they were uncomfortable. She attributes this to the home health nurse. She denies signs of infection. She has started her first treatment of antibody infusions for her Bilateral lower extremity squamous cell carcinoma. She has no complaints today. 7/21; patient presents for 1 month follow-up. Unfortunately she has not had good experience with her wrap  changes with home health. She would like to do her own dressing changes. She continues to do her antibody infusions. She denies signs of infection. 7/28; patient presents for 1 week follow-up. At last clinic visit she was switched to daily dressing changes due to issues with the wrap and home health placing them. Unfortunately she has developed weeping to her legs bilaterally. She would like to be placed in wraps today. She would also like to follow with [place] weekly for wrap changes instead of having home health change them. She denies signs of infection. 8/4; patient presents for 1 week follow-up. She has tolerated the Kerlix/Coban wraps well. She no longer has weeping to her legs. She took the wrap off 1 day before coming in to be able to take a shower. She has no issues or complaints today. She denies signs of infection. 8/18; patient presents for follow-up. Patient has tolerated the wraps well. She brought her Velcro compression wraps today. She has no issues or complaints today. She had her chemotherapy infusion yesterday without issues. She denies signs of infection. 8/25; patient presents for follow-up. She used her juxta lite compressions for the past week. It is unclear if she is able to put these on correctly since she states she has a hard time getting them to look Bell. She reports 2 open wounds. She currently denies signs of infection. 9/1; patient presents for 1 week follow-up. She has 1 open wound. She tolerated the compression wraps well. She currently denies signs of infection. Patient History Information obtained from Patient. Family History Unknown History. Social History Never smoker, Marital Status - Single, Alcohol Use - Never, Drug Use - No History, Caffeine Use - Never. Medical History Eyes Denies history of Cataracts, Optic Neuritis Ear/Nose/Mouth/Throat Denies history of Chronic sinus problems/congestion, Middle ear problems Hematologic/Lymphatic Patient has history  of Anemia Denies history of Hemophilia, Human Immunodeficiency Virus, Lymphedema, Sickle Cell Disease Respiratory Denies history of Aspiration, Asthma, Chronic Obstructive Pulmonary Disease (COPD), Pneumothorax, Sleep Apnea, Tuberculosis Cardiovascular Patient has history of Arrhythmia - Atrial Flutter, A fibb, Congestive Heart Failure, Hypertension Denies history of Angina, Coronary Artery Disease, Deep Vein Thrombosis, Hypotension, Myocardial Infarction, Peripheral Arterial Disease, Peripheral Venous Disease, Phlebitis, Vasculitis Gastrointestinal Patient has history of Colitis Denies history of Cirrhosis , Crohnoos, Hepatitis A, Hepatitis B, Hepatitis C Endocrine Denies history of Type I Diabetes, Type II Diabetes Genitourinary Denies history of End Stage Renal  Disease Immunological Denies history of Lupus Erythematosus, Raynaudoos, Scleroderma Integumentary (Skin) Denies history of History of Burn Musculoskeletal Patient has history of Osteoarthritis Denies history of Gout, Rheumatoid Arthritis, Osteomyelitis Neurologic Denies history of Dementia, Neuropathy, Quadriplegia, Paraplegia, Seizure Disorder Oncologic Denies history of Received Chemotherapy, Received Radiation Hospitalization/Surgery History - removal of rod left leg. 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 Vitals Time Taken: 1:23 PM, Height: 65 in, Weight: 163 lbs, BMI: 27.1, Temperature: 98.2 F, Pulse: 76 bpm, Respiratory Rate: 17 breaths/min, Blood Pressure: 126/73 mmHg. Cardiovascular 2+ dorsalis pedis/posterior tibialis pulses. Psychiatric pleasant and cooperative. General Notes: Patient now has 1 open wound limited to skin breakdown on her left lower extremity. Epithelialization to  previous wound site on the Bell leg. Good edema control bilaterally. No signs of infection. No weeping to her legs. Integumentary (Hair, Skin) Wound #6R status is Open. Original cause of wound was Blister. The date acquired was: 04/20/2020. The wound has been in treatment 14 weeks. The wound is located on the Bell,Anterior Lower Leg. The wound measures 0.4cm length x 0.3cm width x 0.1cm depth; 0.094cm^2 area and 0.009cm^3 volume. There is a medium amount of serosanguineous drainage noted. The wound margin is distinct with the outline attached to the wound base. There is large (67-100%) red, pink granulation within the wound bed. There is a small (1-33%) amount of necrotic tissue within the wound bed including Adherent Slough. Wound #7R status is Open. Original cause of wound was Blister. The date acquired was: 04/20/2020. The wound has been in treatment 14 weeks. The wound is located on the Left,Anterior Lower Leg. The wound measures 1.9cm length x 1.2cm width x 0.1cm depth; 1.791cm^2 area and 0.179cm^3 volume. There is a medium amount of serosanguineous drainage noted. The wound margin is distinct with the outline attached to the wound base. There is medium (34-66%) red, pink granulation within the wound bed. There is a medium (34-66%) amount of necrotic tissue within the wound bed including Adherent Slough. 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 No signs of infection on exam. She healed the Bell lower extremity wound. I recommended continuing compression therapy but switching the dressing to PolyMem silver. She may require a diuretic to help with her leg  swelling. She states she will discuss this with her primary care provider. We will continue to wrap the Bell leg in office since she tends to develop blisters and wounds even with her compression Velcro wrap. Plan Follow-up Appointments: Return Appointment in 1 week. - Dr. Heber [place] Bathing/ Shower/ Hygiene: May shower with protection but do not get wound dressing(s) wet. - Use cast protector Edema Control - Lymphedema / SCD / Other: Elevate legs to the level of the heart or above for 30 minutes daily and/or when sitting, a frequency of: - elevate the legs throughout the day heart level if possible. Avoid standing for long periods of time. Exercise regularly Additional Orders / Instructions: Follow Nutritious Diet WOUND #6R: - Lower Leg Wound Laterality: Bell, Anterior Cleanser: Soap and Water 1 x Per Week/15 Days Discharge Instructions: May shower and wash wound with dial antibacterial  soap and water prior to dressing change. Cleanser: Wound Cleanser 1 x Per Week/15 Days Discharge Instructions: Cleanse the wound with wound cleanser prior to applying a clean dressing using gauze sponges, not tissue or cotton balls. Peri-Wound Care: Triamcinolone 15 (g) 1 x Per Week/15 Days Discharge Instructions: In clinic only. Use triamcinolone 15 (g) in clinic only. Peri-Wound Care: Sween Lotion (Moisturizing lotion) 1 x Per Week/15 Days Discharge Instructions: Apply moisturizing lotion as directed Prim Dressing: PolyMem Silver Non-Adhesive Dressing, 4.25x4.25 in 1 x Per Week/15 Days ary Discharge Instructions: Apply to wound bed as instructed Secondary Dressing: Woven Gauze Sponge, Non-Sterile 4x4 in 1 x Per Week/15 Days Discharge Instructions: Apply over primary dressing as directed. Secondary Dressing: ABD Pad, 8x10 1 x Per Week/15 Days Discharge Instructions: Apply over primary dressing as directed. Com pression Wrap: Kerlix Roll 4.5x3.1 (in/yd) 1 x Per Week/15 Days Discharge Instructions:  Apply Kerlix and Coban compression as directed. Com pression Wrap: Coban Self-Adherent Wrap 4x5 (in/yd) 1 x Per Week/15 Days Discharge Instructions: Apply over Kerlix as directed. Com pression Stockings: Circaid Juxta Lite Compression Wrap Compression Amount: 30-40 mmHg (left) Compression Amount: 30-40 mmHg (Bell) Discharge Instructions: Apply Circaid Juxta Lite Compression Wrap daily as instructed. Apply first thing in the morning, remove at night before bed. WOUND #7R: - Lower Leg Wound Laterality: Left, Anterior Cleanser: Soap and Water 1 x Per Week/15 Days Discharge Instructions: May shower and wash wound with dial antibacterial soap and water prior to dressing change. Cleanser: Wound Cleanser 1 x Per Week/15 Days Discharge Instructions: Cleanse the wound with wound cleanser prior to applying a clean dressing using gauze sponges, not tissue or cotton balls. Peri-Wound Care: Triamcinolone 15 (g) 1 x Per Week/15 Days Discharge Instructions: In clinic only. Use triamcinolone 15 (g) in clinic only. Peri-Wound Care: Sween Lotion (Moisturizing lotion) 1 x Per Week/15 Days Discharge Instructions: Apply moisturizing lotion as directed Prim Dressing: PolyMem Silver Non-Adhesive Dressing, 4.25x4.25 in 1 x Per Week/15 Days ary Discharge Instructions: Apply to wound bed as instructed Secondary Dressing: Woven Gauze Sponge, Non-Sterile 4x4 in 1 x Per Week/15 Days Discharge Instructions: Apply over primary dressing as directed. Secondary Dressing: ABD Pad, 8x10 1 x Per Week/15 Days Discharge Instructions: Apply over primary dressing as directed. Com pression Wrap: Kerlix Roll 4.5x3.1 (in/yd) 1 x Per Week/15 Days Discharge Instructions: Apply Kerlix and Coban compression as directed. Com pression Wrap: Coban Self-Adherent Wrap 4x5 (in/yd) 1 x Per Week/15 Days Discharge Instructions: Apply over Kerlix as directed. 1. Kerlix/Coban bilaterally 2. Silver alginate to the left lower extremity wound 3.  Follow-up in 1 week Electronic Signature(s) Signed: 09/20/2020 2:18:03 PM By: Kalman Shan DO Entered By: Kalman Shan on 09/20/2020 14:17:31 -------------------------------------------------------------------------------- HxROS Details Patient Name: Date of Service: [name], Cariann A. 09/20/2020 1:00 PM Medical Record Number: [id number] Patient Account Number: [id number] Date of Birth/Sex: Treating RN: 05-27-1925 (87 y.o. [name] Primary Care Provider: [name] Other Clinician: Referring Provider: Treating Provider/Extender: [name] in Treatment: 14 Information Obtained From Patient Eyes Medical History: Negative for: Cataracts; Optic Neuritis Ear/Nose/Mouth/Throat Medical History: Negative for: Chronic sinus problems/congestion; Middle ear problems Hematologic/Lymphatic Medical History: Positive for: Anemia Negative for: Hemophilia; Human Immunodeficiency Virus; Lymphedema; Sickle Cell Disease Respiratory Medical History: Negative for: Aspiration; Asthma; Chronic Obstructive Pulmonary Disease (COPD); Pneumothorax; Sleep Apnea; Tuberculosis Cardiovascular Medical History: Positive for: Arrhythmia - Atrial Flutter, A fibb; Congestive Heart Failure; Hypertension Negative for: Angina; Coronary Artery Disease; Deep Vein Thrombosis; Hypotension; Myocardial Infarction; Peripheral  Arterial Disease; Peripheral Venous Disease; Phlebitis; Vasculitis Past Medical History Notes: hyperlipidemia Gastrointestinal Medical History: Positive for: Colitis Negative for: Cirrhosis ; Crohns; Hepatitis A; Hepatitis B; Hepatitis C Endocrine Medical History: Negative for: Type I Diabetes; Type II Diabetes Past Medical History Notes: hypothyroidism Genitourinary Medical History: Negative for: End Stage Renal Disease Immunological Medical History: Negative for: Lupus Erythematosus; Raynauds; Scleroderma Integumentary  (Skin) Medical History: Negative for: History of Burn Musculoskeletal Medical History: Positive for: Osteoarthritis Negative for: Gout; Rheumatoid Arthritis; Osteomyelitis Neurologic Medical History: Negative for: Dementia; Neuropathy; Quadriplegia; Paraplegia; Seizure Disorder Past Medical History Notes: lumbar spindylolysis Oncologic Medical History: Negative for: Received Chemotherapy; Received Radiation Past Medical History Notes: BLE squamous ceel carcionoma Immunizations Pneumococcal Vaccine: Received Pneumococcal Vaccination: Yes Received Pneumococcal Vaccination On or After 60th Birthday: No Implantable Devices None Hospitalization / Surgery History Type of Hospitalization/Surgery removal of rod left leg Family and Social History Unknown History: Yes; Never smoker; Marital Status - Single; Alcohol Use: Never; Drug Use: No History; Caffeine Use: Never; Financial Concerns: No; Food, Clothing or Shelter Needs: No; Support System Lacking: No; Transportation Concerns: No Electronic Signature(s) Signed: 09/20/2020 2:18:03 PM By: Kalman Shan DO Signed: 09/20/2020 4:23:50 PM By: Rhae Hammock RN Entered By: Kalman Shan on 09/20/2020 14:13:56 -------------------------------------------------------------------------------- SuperBill Details Patient Name: Date of Service: [name], Tangee A. 09/20/2020 Medical Record Number: [id number] Patient Account Number: [id number] Date of Birth/Sex: Treating RN: 1926-01-03 (87 y.o. 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 L97.829 Non-pressure chronic ulcer of other part of left lower leg  with unspecified severity I87.2 Venous insufficiency (chronic) (peripheral) Facility Procedures CPT4 Code: [id number] Description: 99214 - WOUND CARE VISIT-LEV 4 EST PT Modifier: Quantity: 1 Physician Procedures : CPT4 Code Description Modifier [id number] - WC PHYS LEVEL 3 - EST PT ICD-10 Diagnosis Description C44.92 Squamous cell carcinoma of skin, unspecified L97.819 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

## 2020-09-25 NOTE — Progress Notes (Addendum)
[name] ([id number]) , Visit Report for 09/20/2020 Arrival Information Details Patient Name: Date of Service: [name], [place]. 09/20/2020 1:00 PM Medical Record Number: [id number] Patient Account Number: [id number] Date of Birth/Sex: Treating RN: 04/11/1925 (87 y.o. [name], [name] Primary Care Gene Colee: [name] Other Clinician: Referring Pragya Lofaso: Treating Yazeed Pryer/Extender: [name] in Treatment: 14 Visit Information History Since Last Visit Added or deleted any medications: No Patient Arrived: Walker Any new allergies or adverse reactions: No Arrival Time: 13:18 Had a fall or experienced change in No Accompanied By: self activities of daily living that may affect Transfer Assistance: None risk of falls: Patient Identification Verified: Yes Signs or symptoms of abuse/neglect since last visito No Secondary Verification Process Completed: Yes Hospitalized since last visit: No Patient Requires Transmission-Based No Implantable device outside of the clinic excluding No Precautions: cellular tissue based products placed in the center Patient Has Alerts: Yes since last visit: Patient Alerts: R ABI = Non compressible Has Dressing in Place as Prescribed: Yes L ABI = Non compressible Pain Present Now: No Electronic Signature(s) Signed: 09/25/2020 7:50:07 AM By: Sandre Kitty Entered By: Sandre Kitty on 09/20/2020 13:23:41 -------------------------------------------------------------------------------- Clinic Level of Care Assessment Details Patient Name: Date of Service: [name], [name] 09/20/2020 1:00 PM Medical Record Number: [id number] Patient Account Number: [id number] Date of Birth/Sex: Treating RN: 1925-01-22 (87 y.o. [name], [name] Primary Care Dyasia Firestine: [name] Other Clinician: Referring Mardel Grudzien: Treating Lechelle Wrigley/Extender: [name] in Treatment:  14 Clinic Level of Care Assessment Items TOOL 4 Quantity Score X- 1 0 Use when only an EandM is performed on FOLLOW-UP visit ASSESSMENTS - Nursing Assessment / Reassessment X- 1 10 Reassessment of Co-morbidities (includes updates in patient status) X- 1 5 Reassessment of Adherence to Treatment Plan ASSESSMENTS - Wound and Skin A ssessment / Reassessment _0  - 0 Simple Wound Assessment / Reassessment - one wound X- 1 5 Complex Wound Assessment / Reassessment - multiple wounds _1  - 0 Dermatologic / Skin Assessment (not related to wound area) ASSESSMENTS - Focused Assessment X- 1 5 Circumferential Edema Measurements - multi extremities _2  - 0 Nutritional Assessment / Counseling / Intervention _3  - 0 Lower Extremity Assessment (monofilament, tuning fork, pulses) _4  - 0 Peripheral Arterial Disease Assessment (using hand held doppler) ASSESSMENTS - Ostomy and/or Continence Assessment and Care _5  - 0 Incontinence Assessment and Management _6  - 0 Ostomy Care Assessment and Management (repouching, etc.) PROCESS - Coordination of Care _7  - 0 Simple Patient / Family Education for ongoing care X- 1 20 Complex (extensive) Patient / Family Education for ongoing care X- 1 10 Staff obtains Programmer, systems, Records, T Results / Process Orders est X- 1 10 Staff telephones HHA, Nursing Homes / Clarify orders / etc _8  - 0 Routine Transfer to another Facility (non-emergent condition) _9  - 0 Routine Hospital Admission (non-emergent condition) _10  - 0 New Admissions / Biomedical engineer / Ordering NPWT Apligraf, etc. , _11  - 0 Emergency Hospital Admission (emergent condition) _12  - 0 Simple Discharge Coordination X- 1 15 Complex (extensive) Discharge Coordination PROCESS - Special Needs _13  - 0 Pediatric / Minor Patient Management _14  - 0 Isolation Patient Management _15  - 0 Hearing / Language / Visual special needs _16  - 0 Assessment of Community assistance (transportation, D/C  planning, etc.) _17  - 0 Additional assistance / Altered mentation _18  - 0 Support Surface(s) Assessment (bed, cushion, seat, etc.) INTERVENTIONS - Wound Cleansing / Measurement _19  - 0 Simple Wound Cleansing -  one wound X- 2 5 Complex Wound Cleansing - multiple wounds X- 1 5 Wound Imaging (photographs - any number of wounds) _0  - 0 Wound Tracing (instead of photographs) _1  - 0 Simple Wound Measurement - one wound X- 2 5 Complex Wound Measurement - multiple wounds INTERVENTIONS - Wound Dressings _2  - 0 Small Wound Dressing one or multiple wounds X- 2 15 Medium Wound Dressing one or multiple wounds _3  - 0 Large Wound Dressing one or multiple wounds X- 1 5 Application of Medications - topical <[id number]>_4<\/ONGEXBMWUXLKGMWN>_0  - 0 Application of Medications - injection INTERVENTIONS - Miscellaneous _5  - 0 External ear exam _6  - 0 Specimen Collection (cultures, biopsies, blood, body fluids, etc.) _7  - 0 Specimen(s) / Culture(s) sent or taken to Lab for analysis _8  - 0 Patient Transfer (multiple staff / Civil Service fast streamer / Similar devices) _9  - 0 Simple Staple / Suture removal (25 or less) _10  - 0 Complex Staple / Suture removal (26 or more) _11  - 0 Hypo / Hyperglycemic Management (close monitor of Blood Glucose) _12  - 0 Ankle / Brachial Index (ABI) - do not check if billed separately X- 1 5 Vital Signs Has the patient been seen at the hospital within the last three years: Yes Total Score: 145 Level Of Care: New/Established - Level 4 Electronic Signature(s) Signed: 09/25/2020 7:50:07 AM By: Sandre Kitty Entered By: Sandre Kitty on 09/20/2020 13:44:17 -------------------------------------------------------------------------------- Complex / Palliative Patient Assessment Details Patient Name: Date of Service: [name], [name] 09/20/2020 1:00 PM Medical Record Number: [id number] Patient Account Number: [id number] Date of Birth/Sex: Treating RN: [birth year] (87 y.o. [name] Primary Care Ilo Beamon:  [name] Other Clinician: Referring Jonica Bickhart: Treating Makail Watling/Extender: [name] in Treatment: 14 Palliative Management Criteria Complex Wound Management Criteria Patient has remarkable or complex co-morbidities requiring medications or treatments that extend wound healing times. Examples: Diabetes mellitus with chronic renal failure or end stage renal disease requiring dialysis Advanced or poorly controlled rheumatoid arthritis Diabetes mellitus and end stage chronic obstructive pulmonary disease Active cancer with current chemo- or radiation therapy Malignant wounds Care Approach Wound Care Plan: Complex Wound Management Electronic Signature(s) Signed: 09/28/2020 12:46:18 PM By: Kalman Shan DO Signed: 10/18/2020 4:49:48 PM By: Levan Hurst RN, BSN Entered By: Levan Hurst on 09/27/2020 08:25:26 -------------------------------------------------------------------------------- Encounter Discharge Information Details Patient Name: Date of Service: [name], [name] 09/20/2020 1:00 PM Medical Record Number: [id number] Patient Account Number: [id number] Date of Birth/Sex: Treating RN: December 07, 1925 (87 y.o. [name], [name] Primary Care Mitchell Iwanicki: [name] Other Clinician: Referring Vito Beg: Treating Dyanna Seiter/Extender: [name] in Treatment: 14 Encounter Discharge Information Items Discharge Condition: Stable Ambulatory Status: Walker Discharge Destination: Home Transportation: Private Auto Accompanied By: self Schedule Follow-up Appointment: Yes Clinical Summary of Care: Patient Declined Electronic Signature(s) Signed: 09/25/2020 7:50:07 AM By: Sandre Kitty Entered By: Sandre Kitty on 09/20/2020 13:45:13 -------------------------------------------------------------------------------- Lower Extremity Assessment Details Patient Name: Date of Service: [name], [name]  09/20/2020 1:00 PM Medical Record Number: [id number] Patient Account Number: [id number] Date of Birth/Sex: Treating RN: April 07, 1925 (87 y.o. [name], [name] Primary Care Charmian Forbis: [name] Other Clinician: Referring Marwa Fuhrman: Treating Maleigh Bagot/Extender: [name] in Treatment: 14 Edema Assessment Assessed: [Left: Yes] [Right: Yes] Edema: [Left: Yes] [Right: Yes] Calf Left: Right: Point of Measurement: 30 cm From Medial Instep 42 cm 42.5 cm Ankle Left: Right: Point of Measurement: 9 cm From Medial Instep 25 cm 25.5 cm Vascular Assessment Pulses: Dorsalis Pedis  Palpable: [Left:Yes] [Right:Yes] Posterior Tibial Palpable: [Left:Yes] [Right:Yes] Electronic Signature(s) Signed: 09/20/2020 4:23:50 PM By: Rhae Hammock RN Signed: 09/25/2020 7:50:07 AM By: Sandre Kitty Entered By: Sandre Kitty on 09/20/2020 13:38:25 -------------------------------------------------------------------------------- Multi Wound Chart Details Patient Name: Date of Service: [name], [name] 09/20/2020 1:00 PM Medical Record Number: [id number] Patient Account Number: [id number] Date of Birth/Sex: Treating RN: September 16, 1925 (87 y.o. [name], [name] Primary Care Kateryn Marasigan: [name] Other Clinician: Referring Gerold Sar: Treating Rilei Kravitz/Extender: [name] in Treatment: 14 Vital Signs Height(in): 65 Pulse(bpm): 60 Weight(lbs): 163 Blood Pressure(mmHg): 126/73 Body Mass Index(BMI): 27 Temperature(F): 98.2 Respiratory Rate(breaths/min): 17 Photos: Photos: [N/A:N/A] Right, Anterior Lower Leg Left, Anterior Lower Leg N/A Wound Location: Blister Blister N/A Wounding Event: Malignant Wound Malignant Wound N/A Primary Etiology: Anemia, Arrhythmia, Congestive Heart Anemia, Arrhythmia, Congestive Heart N/A Comorbid History: Failure, Hypertension, Colitis, Failure, Hypertension,  Colitis, Osteoarthritis Osteoarthritis 04/20/2020 04/20/2020 N/A Date Acquired: 14 14 N/A Weeks of Treatment: Open Open N/A Wound Status: Yes Yes N/A Wound Recurrence: 0.4x0.3x0.1 1.9x1.2x0.1 N/A Measurements L x W x D (cm) 0.094 1.791 N/A A (cm) : rea 0.009 0.179 N/A Volume (cm) : 98.50% 41.70% N/A % Reduction in Area: 99.50% 41.70% N/A % Reduction in Volume: Full Thickness Without Exposed Full Thickness Without Exposed N/A Classification: Support Structures Support Structures Medium Medium N/A Exudate Amount: Serosanguineous Serosanguineous N/A Exudate Type: red, brown red, brown N/A Exudate Color: Distinct, outline attached Distinct, outline attached N/A Wound Margin: Large (67-100%) Medium (34-66%) N/A Granulation Amount: Red, Pink Red, Pink N/A Granulation Quality: Small (1-33%) Medium (34-66%) N/A Necrotic Amount: Fascia: No Fascia: No N/A Exposed Structures: Fat Layer (Subcutaneous Tissue): No Fat Layer (Subcutaneous Tissue): No Tendon: No Tendon: No Muscle: No Muscle: No Joint: No Joint: No Bone: No Bone: No None None N/A Epithelialization: Treatment Notes Wound #6R (Lower Leg) Wound Laterality: Right, Anterior Cleanser Soap and Water Discharge Instruction: May shower and wash wound with dial antibacterial soap and water prior to dressing change. Wound Cleanser Discharge Instruction: Cleanse the wound with wound cleanser prior to applying a clean dressing using gauze sponges, not tissue or cotton balls. Peri-Wound Care Triamcinolone 15 (g) Discharge Instruction: In clinic only. Use triamcinolone 15 (g) in clinic only. Sween Lotion (Moisturizing lotion) Discharge Instruction: Apply moisturizing lotion as directed Topical Primary Dressing PolyMem Silver Non-Adhesive Dressing, 4.25x4.25 in Discharge Instruction: Apply to wound bed as instructed Secondary Dressing Woven Gauze Sponge, Non-Sterile 4x4 in Discharge Instruction: Apply over primary  dressing as directed. ABD Pad, 8x10 Discharge Instruction: Apply over primary dressing as directed. Secured With Compression Wrap Kerlix Roll 4.5x3.1 (in/yd) Discharge Instruction: Apply Kerlix and Coban compression as directed. Coban Self-Adherent Wrap 4x5 (in/yd) Discharge Instruction: Apply over Kerlix as directed. Compression Stockings Circaid Juxta Lite Compression Wrap Quantity: 1 Left Leg Compression Amount: 30-40 mmHg Right Leg Compression Amount: 30-40 mmHg Discharge Instruction: Apply Circaid Juxta Lite Compression Wrap daily as instructed. Apply first thing in the morning, remove at night before bed. Add-Ons Wound #7R (Lower Leg) Wound Laterality: Left, Anterior Cleanser Soap and Water Discharge Instruction: May shower and wash wound with dial antibacterial soap and water prior to dressing change. Wound Cleanser Discharge Instruction: Cleanse the wound with wound cleanser prior to applying a clean dressing using gauze sponges, not tissue or cotton balls. Peri-Wound Care Triamcinolone 15 (g) Discharge Instruction: In clinic only. Use triamcinolone 15 (g) in clinic only. Sween Lotion (Moisturizing lotion) Discharge Instruction: Apply moisturizing lotion as directed Topical Primary Dressing PolyMem Silver Non-Adhesive Dressing,  4.25x4.25 in Discharge Instruction: Apply to wound bed as instructed Secondary Dressing Woven Gauze Sponge, Non-Sterile 4x4 in Discharge Instruction: Apply over primary dressing as directed. ABD Pad, 8x10 Discharge Instruction: Apply over primary dressing as directed. Secured With Compression Wrap Kerlix Roll 4.5x3.1 (in/yd) Discharge Instruction: Apply Kerlix and Coban compression as directed. Coban Self-Adherent Wrap 4x5 (in/yd) Discharge Instruction: Apply over Kerlix as directed. Compression Stockings Add-Ons Electronic Signature(s) Signed: 09/20/2020 2:18:03 PM By: Kalman Shan DO Signed: 09/20/2020 4:23:50 PM By: Rhae Hammock  RN Entered By: Kalman Shan on 09/20/2020 14:13:02 -------------------------------------------------------------------------------- Multi-Disciplinary Care Plan Details Patient Name: Date of Service: [name], [name] 09/20/2020 1:00 PM Medical Record Number: [id number] Patient Account Number: [id number] Date of Birth/Sex: Treating RN: 07/11/1925 (87 y.o. [name], [name] Primary Care Madysyn Hanken: [name] Other Clinician: Referring Addysyn Fern: Treating Lakeesha Fontanilla/Extender: [name] in Treatment: [place] reviewed with physician Active Inactive Malignancy/Atypical Etiology Nursing Diagnoses: Knowledge deficit related to disease process and management of malignancy Goals: Patient/caregiver will verbalize understanding of disease process and disease management of atypical ulcer etiology Date Initiated: 06/11/2020 Date Inactivated: 07/11/2020 Target Resolution Date: 07/06/2020 Goal Status: Met Patient/caregiver will verbalize understanding of disease process and disease management of malignancy Date Initiated: 06/11/2020 Target Resolution Date: 10/06/2020 Goal Status: Active Interventions: Assess patient and family medical history for signs and symptoms of malignancy/atypical etiology upon admission Provide education on atypical ulcer etiologies Provide education on malignant ulcerations Notes: Wound/Skin Impairment Nursing Diagnoses: Impaired tissue integrity Knowledge deficit related to ulceration/compromised skin integrity Goals: Patient/caregiver will verbalize understanding of skin care regimen Date Initiated: 06/11/2020 Target Resolution Date: 10/11/2020 Goal Status: Active Interventions: Assess patient/caregiver ability to obtain necessary supplies Assess patient/caregiver ability to perform ulcer/skin care regimen upon admission and as needed Assess ulceration(s) every visit Provide education on ulcer  and skin care Notes: Electronic Signature(s) Signed: 09/20/2020 4:23:50 PM By: Rhae Hammock RN Signed: 09/25/2020 7:50:07 AM By: Sandre Kitty Entered By: Sandre Kitty on 09/20/2020 13:42:57 -------------------------------------------------------------------------------- Pain Assessment Details Patient Name: Date of Service: [name], Haliyah A. 09/20/2020 1:00 PM Medical Record Number: [id number] Patient Account Number: [id number] Date of Birth/Sex: Treating RN: 1925-05-25 (87 y.o. [name], [name] Primary Care Tyannah Sane: [name] Other Clinician: Referring Chantele Corado: Treating Raylea Adcox/Extender: [name] in Treatment: 14 Active Problems Location of Pain Severity and Description of Pain Patient Has Paino No Site Locations Pain Management and Medication Current Pain Management: Electronic Signature(s) Signed: 09/20/2020 4:23:50 PM By: Rhae Hammock RN Signed: 09/25/2020 7:50:07 AM By: Sandre Kitty Entered By: Sandre Kitty on 09/20/2020 13:24:07 -------------------------------------------------------------------------------- Patient/Caregiver Education Details Patient Name: Date of Service: [name], [name] 9/1/2022andnbsp1:00 PM Medical Record Number: [id number] Patient Account Number: [id number] Date of Birth/Gender: Treating RN: 1925/07/10 (87 y.o. [name], [name] Primary Care Physician: [name] Other Clinician: Referring Physician: Treating Physician/Extender: [name] in Treatment: 14 Education Assessment Education Provided To: Patient Education Topics Provided Malignant/Atypical Wounds: Methods: Explain/Verbal Responses: State content correctly Wound/Skin Impairment: Methods: Explain/Verbal Responses: State content correctly Electronic Signature(s) Signed: 09/25/2020 7:50:07 AM By: Sandre Kitty Entered By: Sandre Kitty on 09/20/2020  13:43:17 -------------------------------------------------------------------------------- Wound Assessment Details Patient Name: Date of Service: [name], [name] 09/20/2020 1:00 PM Medical Record Number: [id number] Patient Account Number: [id number] Date of Birth/Sex: Treating RN: 11-22-1925 (87 y.o. [name] Primary Care Bradee Common: [name] Other Clinician: Referring Comer Devins: Treating Rabab Currington/Extender: [name] in Treatment: 14 Wound  Status Wound Number: [id number] Primary Malignant Wound Etiology: Wound Location: Right, Anterior Lower Leg Wound Open Wounding Event: Blister Status: Date Acquired: 04/20/2020 Comorbid Anemia, Arrhythmia, Congestive Heart Failure, Hypertension, Weeks Of Treatment: 14 History: Colitis, Osteoarthritis Clustered Wound: No Photos Wound Measurements Length: (cm) 0.4 Width: (cm) 0.3 Depth: (cm) 0.1 Area: (cm) 0.094 Volume: (cm) 0.009 % Reduction in Area: 98.5% % Reduction in Volume: 99.5% Epithelialization: None Wound Description Classification: Full Thickness Without Exposed Support Structures Wound Margin: Distinct, outline attached Exudate Amount: Medium Exudate Type: Serosanguineous Exudate Color: red, brown Foul Odor After Cleansing: No Slough/Fibrino Yes Wound Bed Granulation Amount: Large (67-100%) Exposed Structure Granulation Quality: Red, Pink Fascia Exposed: No Necrotic Amount: Small (1-33%) Fat Layer (Subcutaneous Tissue) Exposed: No Necrotic Quality: Adherent Slough Tendon Exposed: No Muscle Exposed: No Joint Exposed: No Bone Exposed: No Electronic Signature(s) Signed: 09/20/2020 4:37:51 PM By: Lorrin Jackson Entered By: Lorrin Jackson on 09/20/2020 13:31:48 -------------------------------------------------------------------------------- Wound Assessment Details Patient Name: Date of Service: [name], [name] 09/20/2020 1:00 PM Medical Record Number: [id number] Patient  Account Number: [id number] Date of Birth/Sex: Treating RN: [birth year] (87 y.o. [name] Primary Care Andrina Locken: [name] Other Clinician: Referring Eldred Sooy: Treating Salih Williamson/Extender: [name] in Treatment: 14 Wound Status Wound Number: [id number] Primary Malignant Wound Etiology: Wound Location: Left, Anterior Lower Leg Wound Open Wounding Event: Blister Status: Date Acquired: 04/20/2020 Comorbid Anemia, Arrhythmia, Congestive Heart Failure, Hypertension, Weeks Of Treatment: 14 History: Colitis, Osteoarthritis Clustered Wound: No Photos Wound Measurements Length: (cm) 1.9 Width: (cm) 1.2 Depth: (cm) 0.1 Area: (cm) 1.791 Volume: (cm) 0.179 % Reduction in Area: 41.7% % Reduction in Volume: 41.7% Epithelialization: None Wound Description Classification: Full Thickness Without Exposed Support Structures Wound Margin: Distinct, outline attached Exudate Amount: Medium Exudate Type: Serosanguineous Exudate Color: red, brown Foul Odor After Cleansing: No Slough/Fibrino Yes Wound Bed Granulation Amount: Medium (34-66%) Exposed Structure Granulation Quality: Red, Pink Fascia Exposed: No Necrotic Amount: Medium (34-66%) Fat Layer (Subcutaneous Tissue) Exposed: No Necrotic Quality: Adherent Slough Tendon Exposed: No Muscle Exposed: No Joint Exposed: No Bone Exposed: No Electronic Signature(s) Signed: 09/20/2020 4:37:51 PM By: Lorrin Jackson Entered By: Lorrin Jackson on 09/20/2020 13:32:38 -------------------------------------------------------------------------------- Vitals Details Patient Name: Date of Service: [name], [name] 09/20/2020 1:00 PM Medical Record Number: [id number] Patient Account Number: [id number] Date of Birth/Sex: Treating RN: [birth year] (87 y.o. [name], [name] Primary Care Kenora Spayd: [name] Other Clinician: Referring Alphonso Gregson: Treating Mellody Masri/Extender: [name] in Treatment: 14 Vital Signs Time Taken: 13:23 Temperature (F): 98.2 Height (in): 65 Pulse (bpm): 76 Weight (lbs): 163 Respiratory Rate (breaths/min): 17 Body Mass Index (BMI): 27.1 Blood Pressure (mmHg): 126/73 Reference Range: 80 - 120 mg / dl Electronic Signature(s) Signed: 09/25/2020 7:50:07 AM By: Sandre Kitty Entered By: Sandre Kitty on 09/20/2020 13:23:58

## 2020-09-26 ENCOUNTER — Other Ambulatory Visit: Payer: Self-pay

## 2020-09-26 ENCOUNTER — Inpatient Hospital Stay (HOSPITAL_BASED_OUTPATIENT_CLINIC_OR_DEPARTMENT_OTHER): Payer: Medicare Other | Admitting: Oncology

## 2020-09-26 ENCOUNTER — Inpatient Hospital Stay: Payer: Medicare Other | Attending: Oncology

## 2020-09-26 ENCOUNTER — Inpatient Hospital Stay: Payer: Medicare Other

## 2020-09-26 VITALS — BP 131/63 | HR 68 | Temp 97.7°F | Resp 18 | Ht 65.0 in | Wt 153.9 lb

## 2020-09-26 DIAGNOSIS — C50412 Malignant neoplasm of upper-outer quadrant of left female breast: Secondary | ICD-10-CM | POA: Diagnosis not present

## 2020-09-26 DIAGNOSIS — C44721 Squamous cell carcinoma of skin of unspecified lower limb, including hip: Secondary | ICD-10-CM | POA: Diagnosis not present

## 2020-09-26 DIAGNOSIS — C44722 Squamous cell carcinoma of skin of right lower limb, including hip: Secondary | ICD-10-CM | POA: Diagnosis not present

## 2020-09-26 DIAGNOSIS — Z79899 Other long term (current) drug therapy: Secondary | ICD-10-CM | POA: Diagnosis not present

## 2020-09-26 DIAGNOSIS — C4490 Unspecified malignant neoplasm of skin, unspecified: Secondary | ICD-10-CM | POA: Diagnosis not present

## 2020-09-26 DIAGNOSIS — Z170 Estrogen receptor positive status [ER+]: Secondary | ICD-10-CM | POA: Insufficient documentation

## 2020-09-26 DIAGNOSIS — Z5112 Encounter for antineoplastic immunotherapy: Secondary | ICD-10-CM | POA: Insufficient documentation

## 2020-09-26 DIAGNOSIS — C44729 Squamous cell carcinoma of skin of left lower limb, including hip: Secondary | ICD-10-CM | POA: Insufficient documentation

## 2020-09-26 DIAGNOSIS — C50912 Malignant neoplasm of unspecified site of left female breast: Secondary | ICD-10-CM

## 2020-09-26 DIAGNOSIS — E064 Drug-induced thyroiditis: Secondary | ICD-10-CM

## 2020-09-26 LAB — CBC WITH DIFFERENTIAL/PLATELET
Abs Immature Granulocytes: 0.01 10*3/uL (ref 0.00–0.07)
Basophils Absolute: 0 10*3/uL (ref 0.0–0.1)
Basophils Relative: 1 %
Eosinophils Absolute: 0.2 10*3/uL (ref 0.0–0.5)
Eosinophils Relative: 3 %
HCT: 37.4 % (ref 36.0–46.0)
Hemoglobin: 12.2 g/dL (ref 12.0–15.0)
Immature Granulocytes: 0 %
Lymphocytes Relative: 25 %
Lymphs Abs: 1.3 10*3/uL (ref 0.7–4.0)
MCH: 34.6 pg — ABNORMAL HIGH (ref 26.0–34.0)
MCHC: 32.6 g/dL (ref 30.0–36.0)
MCV: 105.9 fL — ABNORMAL HIGH (ref 80.0–100.0)
Monocytes Absolute: 0.6 10*3/uL (ref 0.1–1.0)
Monocytes Relative: 11 %
Neutro Abs: 3.3 10*3/uL (ref 1.7–7.7)
Neutrophils Relative %: 60 %
Platelets: 213 10*3/uL (ref 150–400)
RBC: 3.53 MIL/uL — ABNORMAL LOW (ref 3.87–5.11)
RDW: 13.4 % (ref 11.5–15.5)
WBC: 5.4 10*3/uL (ref 4.0–10.5)
nRBC: 0 % (ref 0.0–0.2)

## 2020-09-26 LAB — COMPREHENSIVE METABOLIC PANEL
ALT: 19 U/L (ref 0–44)
AST: 23 U/L (ref 15–41)
Albumin: 3.6 g/dL (ref 3.5–5.0)
Alkaline Phosphatase: 73 U/L (ref 38–126)
Anion gap: 11 (ref 5–15)
BUN: 15 mg/dL (ref 8–23)
CO2: 25 mmol/L (ref 22–32)
Calcium: 9.5 mg/dL (ref 8.9–10.3)
Chloride: 104 mmol/L (ref 98–111)
Creatinine, Ser: 1.13 mg/dL — ABNORMAL HIGH (ref 0.44–1.00)
GFR, Estimated: 46 mL/min — ABNORMAL LOW (ref >60)
Glucose, Bld: 146 mg/dL — ABNORMAL HIGH (ref 70–99)
Potassium: 3.9 mmol/L (ref 3.5–5.1)
Sodium: 140 mmol/L (ref 135–145)
Total Bilirubin: 0.7 mg/dL (ref 0.3–1.2)
Total Protein: 7.5 g/dL (ref 6.5–8.1)

## 2020-09-26 LAB — TSH: TSH: 0.256 u[IU]/mL — ABNORMAL LOW (ref 0.308–3.960)

## 2020-09-26 MED ORDER — SODIUM CHLORIDE 0.9 % IV SOLN
Freq: Once | INTRAVENOUS | Status: AC
Start: 2020-09-26 — End: 2020-09-26

## 2020-09-26 MED ORDER — CEMIPLIMAB-RWLC CHEMO INJECTION 350 MG/7ML
350.0000 mg | Freq: Once | INTRAVENOUS | Status: AC
Start: 2020-09-26 — End: 2020-09-26
  Administered 2020-09-26: 350 mg via INTRAVENOUS
  Filled 2020-09-26: qty 7

## 2020-09-26 NOTE — Progress Notes (Signed)
[place]  Telephone:([phone] Fax:(336) [phone]     ID: [name] DOB: [birth year]  MR#: [id number]  MIW#:[id number]  Patient Care Team: [name], MD as PCP - General Angelena Form Annita Brod, MD as PCP - Cardiology (Cardiology) Rolm Bookbinder, MD as Consulting Physician (General Surgery) Christien Frankl, Virgie Dad, MD as Consulting Physician (Oncology) Burnell Blanks, MD as Consulting Physician (Cardiology) Jarome Matin, MD as Consulting Physician (Dermatology) Marlou Sa Tonna Corner, MD as Consulting Physician (Orthopedic Surgery) Cameron Sprang, MD as Consulting Physician (Neurology) Eppie Gibson, MD as Attending Physician (Radiation Oncology) OTHER MD:   CHIEF COMPLAINT: skin cancer; estrogen receptor positive invasive breast cancer  CURRENT TREATMENT: cemiplimab/ Libtayo   INTERVAL HISTORY: [name] returns today for follow-up of her new skin cancer.  She is by herself and tells me that she was transported here by [employer].  [name] began systemic therapy with cemiplimab on 07/04/2020. She   The last mammogram we have records for is from July 2019 at [facility] showing a density category A.  [name] tells me there has been too much on her plate and she has neglected her health to some extent.   REVIEW OF SYSTEMS: [name] is really not able to tell me very much about how her skin is doing.  She does know she has cancer in her ankles as she puts it.  She complains that she had to be stuck 3 times today to get her labs.  She is tolerating her blood thinner with no bleeding or bruising that she is aware of she tells me her bowel movements now are completely normal.     COVID 19 VACCINATION STATUS: fully vaccinated [employer]), with booster 10/2019   BREAST CANCER HISTORY: From the original intake note:  [name] noted a change in her left breast and had bilateral screening mammography at [facility] 06/07/2015, with tomosynthesis. Breast density was category A.  There was a new oval mass in the left breast central to the nipple. Left breast ultrasound the same day confirmed a 3.4 cm oval mass in the left breast upper outer quadrant, correlating with the mammography. The left axilla was sonographically benign.  Biopsy of the left breast mass in question 06/11/2015 showed ([id number]) a papillary lesion, consistent with an intraductal papilloma, but due to fragmentation the edges of the lesion could not be assessed.  Accordingly the patient was referred to surgery, and after appropriate discussion underwent left lumpectomy 07/25/2015. The final pathology ([id number]) showed 2 foci of invasive ductal carcinoma, grade 2, measuring 3.0 cm, and grade 1, measuring 0.9 cm. There was also ductal carcinoma in situ. The invasive tumor was present at the superior margin, and ductal carcinoma in situ was focally less than 0.1 cm to the same margin. There was evidence of lymphovascular invasion. The prognostic panel showed estrogen receptor 100% positive, progesterone receptor 100% positive, both with strong staining intensity, for both lesions. HER-2 was also negative for both lesions, the signals ratio being 1.45-1.65, and the number per cell 2.10-2.80.  The patient's subsequent history is as detailed below   PAST MEDICAL HISTORY: Past Medical History:  Diagnosis Date   Anxiety    denies   Atrial flutter ([place])    Bilateral edema of lower extremity    Breast cancer (HCC)    left breast   Chronic constipation    Chronic diastolic CHF (congestive heart failure) (HCC)    Diverticulosis of colon    GERD (gastroesophageal reflux disease)    H/O  hiatal hernia    History of cellulitis    LEFT LOWER LEG --  SEPT 2015   History of colitis    DX  2003  WITH ISCHEMIC COLITIS   History of diverticulitis of colon    perferated diverticulitis w/ abscess 08-25-2013 drain placed --  resolved without surgical intervention   History of GI bleed    2011--- UPPER SECONARY  GASTRIC ULCER FROM NSAID USE   History of Helicobacter pylori infection    2011   History of ulcer of lower limb    2012   RIGHT LOWER EXTREMITIY--  STATSIS ULCER   Hyperlipidemia    Hypertension    Hypothyroidism    Iron deficiency anemia 2011   elevated MCV   LBP (low back pain)    severe lumbar spondylosis s/p L3/L4,L4/L5 fusion 12/10   Lumbar spondylolysis    OA (osteoarthritis)    "hands" (08/25/2013)   Open wound of left lower leg    Osteopenia    Persistent atrial fibrillation ([place])    CARDIOLOGIST-   DR MCALANY   RBBB    Urinary frequency     PAST SURGICAL HISTORY: Past Surgical History:  Procedure Laterality Date   APPLICATION OF A-CELL OF EXTREMITY Left 06/21/2013   Procedure: APPLICATION OF A-CELL OF EXTREMITY;  Surgeon: Theodoro Kos, DO;  Location: [place];  Service: Plastics;  Laterality: Left;   APPLICATION OF A-CELL OF EXTREMITY Left 08/22/2013   Procedure: APPLICATION OF A-CELL/VAC TO LOWER LEFT LEG WOUND;  Surgeon: Theodoro Kos, DO;  Location: [place];  Service: Plastics;  Laterality: Left;   APPLICATION OF A-CELL OF EXTREMITY Left 02/06/2014   Procedure: WITH PLACEMENT OF A -CELL ;  Surgeon: Theodoro Kos, DO;  Location: [place];  Service: Plastics;  Laterality: Left;   APPLICATION OF A-CELL OF EXTREMITY Left 08/09/2014   Procedure: PLACEMENT OF A CELL;  Surgeon: Theodoro Kos, DO;  Location: [place];  Service: Plastics;  Laterality: Left;   APPLICATION OF WOUND VAC Left 06/16/2013   Procedure: APPLICATION OF WOUND VAC;  Surgeon: Meredith Pel, MD;  Location: [place];  Service: Orthopedics;  Laterality: Left;   APPLICATION OF WOUND VAC Left 06/21/2013   Procedure: APPLICATION OF WOUND VAC;  Surgeon: Theodoro Kos, DO;  Location: [place];  Service: Plastics;  Laterality: Left;   BIOPSY BREAST  07/25/2015   LEFT RADIOACTIVE SEED GUIDED EXCISIONAL BREAST BIOPSY (Left) as a surgical intervention   EXTERNAL FIXATION LEG Left 06/16/2013    Procedure: EXTERNAL FIXATION LEG;  Surgeon: Meredith Pel, MD;  Location: [place];  Service: Orthopedics;  Laterality: Left;   EXTERNAL FIXATION REMOVAL Left 06/21/2013   Procedure: REMOVAL EXTERNAL FIXATION LEG;  Surgeon: Meredith Pel, MD;  Location: [place];  Service: Orthopedics;  Laterality: Left;   EYE SURGERY Bilateral    cataract removal   HARDWARE REMOVAL Left 05/23/2014   Procedure: HARDWARE REMOVAL;  Surgeon: Meredith Pel, MD;  Location: [place];  Service: Orthopedics;  Laterality: Left;  REMOVAL OF HARDWARE LEFT TIBIA   I & D EXTREMITY Left 06/16/2013   Procedure: IRRIGATION AND DEBRIDEMENT EXTREMITY;  Surgeon: Meredith Pel, MD;  Location: [place];  Service: Orthopedics;  Laterality: Left;   I & D EXTREMITY Left 02/06/2014   Procedure: IRRIGATION AND DEBRIDEMENT LEFT LEG WOUND ;  Surgeon: Theodoro Kos, DO;  Location: [place];  Service: Plastics;  Laterality: Left;   I & D EXTREMITY Left 08/09/2014  Procedure: IRRIGATION AND DEBRIDEMENT  OF LEFT LOWER LEG;  Surgeon: Theodoro Kos, DO;  Location: [place];  Service: Plastics;  Laterality: Left;   LUMBAR FUSION  01-02-2009   L3-L5   RADIOACTIVE SEED GUIDED EXCISIONAL BREAST BIOPSY Left 07/25/2015   Procedure: LEFT RADIOACTIVE SEED GUIDED EXCISIONAL BREAST BIOPSY;  Surgeon: Rolm Bookbinder, MD;  Location: [place];  Service: General;  Laterality: Left;   RE-EXCISION OF BREAST LUMPECTOMY Left 09/05/2015   Procedure: RE-EXCISION OF LEFT BREAST LUMPECTOMY;  Surgeon: Rolm Bookbinder, MD;  Location: [place];  Service: General;  Laterality: Left;   SHOULDER OPEN ROTATOR CUFF REPAIR Left 1997   TIBIA IM NAIL INSERTION Left 06/21/2013   Procedure: INTRAMEDULLARY (IM) NAIL TIBIAL;  Surgeon: Meredith Pel, MD;  Location: [place];  Service: Orthopedics;  Laterality: Left;   TONSILLECTOMY  AS CHILD   TOTAL HIP ARTHROPLASTY Right 06-07-2010   TRANSTHORACIC ECHOCARDIOGRAM  09-11-2010   DR MCALHANY   LVSF  55-60%/  MILD MV  CALCIFICATION WITH NO STENOSIS/  MILD MR & TR / MODERATE LAE/  MODERATE TO SEVERE RAE   UPPER GI ENDOSCOPY  03-29-2010    FAMILY HISTORY Family History  Problem Relation Age of Onset   Cancer Father    Hypertension Other   The patient's father died at the age of 61 from heart problems. The patient's mother died at the age of 63 with a ruptured aneurysm. The patient had no brothers, 1 sister. The patient's granddaughter [name] according to the patient has recurrent breast cancer involving bone and lymph nodes.   GYNECOLOGIC HISTORY:  No LMP recorded. Patient is postmenopausal. Menarche age 8, first live birth age 63. The patient is GX P2. She stopped having periods in her early 86s. She did not take hormone replacement. She used oral contraceptives remotely for approximately 2 years, with no complications.   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 Her husband Ernie Hew was a Airline pilot.  According to the patient there are significant cognitive limitations on his side.  [facility] currently [facility] at [facility]. Daughter [name] lives in [place], near [place]. With her husband [name]. Daughter [name] lives in [place]. The patient has 2 grandchildren and 4 great-grandchildren. She is a Psychologist, forensic.   ADVANCED DIRECTIVES: The patient has a living will in place and she has named her granddaughter Almetta Lovely as her healthcare part of attorney. [name] can be reached at [place]: Social History   Tobacco Use   Smoking status: Never   Smokeless tobacco: Never  Vaping Use   Vaping Use: Never used  Substance Use Topics   Alcohol use: No    Comment: 1 glass occ wine 08/03/14- none in a year   Drug use: No     Colonoscopy: Laurence Spates, remote  PAP:  Bone density:   Allergies  Allergen Reactions   Zosyn [Piperacillin Sod-Tazobactam So] Hives, Itching, Rash and Other (See Comments)    Morbilliform eruptions with itching- looks  like Measles   Atorvastatin     myalgias   Atenolol Nausea And Vomiting   Clarithromycin Nausea And Vomiting   Codeine Sulfate Nausea Only   Levaquin [Levofloxacin] Nausea Only   Macrodantin Other (See Comments)    UNSPECIFIED    Oxycodone-Acetaminophen Nausea And Vomiting    Current Outpatient Medications  Medication Sig Dispense Refill   acetaminophen (TYLENOL) 500 MG tablet Take 500 mg by mouth daily as needed for mild pain.  apixaban (ELIQUIS) 5 MG TABS tablet Take 1 tablet (5 mg total) by mouth 2 (two) times daily. 180 tablet 3   B Complex-C (B-COMPLEX WITH VITAMIN C) tablet Take 1 tablet by mouth daily.     busPIRone (BUSPAR) 15 MG tablet Take 1 tablet (15 mg total) by mouth 2 (two) times daily. 180 tablet 11   Cholecalciferol (VITAMIN D3) 1000 UNITS tablet Take 1,000 Units by mouth daily.     colestipol (COLESTID) 1 g tablet Take 1 tablet (1 g total) by mouth in the morning. 60 tablet 12   diltiazem (CARDIZEM CD) 120 MG 24 hr capsule TAKE 1 CAPSULE (120 MG TOTAL) BY MOUTH DAILY. 90 capsule 3   doxycycline (VIBRA-TABS) 100 MG tablet Take 1 tablet (100 mg total) by mouth daily. 90 tablet 3   famotidine (PEPCID) 20 MG tablet Take 20 mg by mouth daily.     gabapentin (NEURONTIN) 100 MG capsule Take 1 capsule (100 mg total) by mouth at bedtime. Pain 90 capsule 3   levothyroxine (SYNTHROID) 25 MCG tablet TAKE 1 TABLET BY MOUTH DAILY FOR THYROID. 30 tablet 11   loperamide (IMODIUM) 2 MG capsule Take 1 capsule (2 mg total) by mouth as needed for diarrhea or loose stools. Take 1 tab with first loose stool then one tab after each loose stool for maximum dose of 8 tabs daily. Pt to call the office if need for greater then 8 tabs a day 60 capsule 0   metoprolol succinate (TOPROL-XL) 50 MG 24 hr tablet Take 0.5 tablets (25 mg total) by mouth 2 (two) times daily. Take with or immediately following a meal. 180 tablet 3   Multiple Vitamins-Minerals (MULTIVITAMIN WITH MINERALS) tablet Take 1  tablet by mouth daily.     polyethylene glycol (MIRALAX) 17 g packet Take 17 g by mouth daily. 30 each 0   prochlorperazine (COMPAZINE) 5 MG tablet Take 1 tablet (5 mg total) by mouth every 8 (eight) hours as needed for nausea or vomiting. 20 tablet 1   temazepam (RESTORIL) 15 MG capsule TAKE 1 CAPSULE BY MOUTH EVERY NIGHT AT BEDTIME AS NEEDED FOR SLEEP 60 capsule 3   No current facility-administered medications for this visit.    OBJECTIVE: White woman who appears stated age  22:   09/26/20 1421  BP: 131/63  Pulse: 68  Resp: 18  Temp: 97.7 F (36.5 C)     Body mass index is 25.61 kg/m.     ECOG FS:1 - Symptomatic but completely ambulatory  Sclerae unicteric, EOMs intact Wearing a mask No cervical or supraclavicular adenopathy Lungs no rales or rhonchi Heart regular rate and rhythm Abd soft, nontender, positive bowel sounds MSK no focal spinal tenderness, lower extremities wrapped Neuro: nonfocal, appropriate affect   08/09/20 wound care center appointment:       06/20/2020 [place] appointment    LAB RESULTS:  CMP     Component Value Date/Time   NA 140 09/26/2020 1332   NA 134 08/11/2018 1425   NA 137 07/28/2016 1334   K 3.9 09/26/2020 1332   K 3.8 07/28/2016 1334   CL 104 09/26/2020 1332   CO2 25 09/26/2020 1332   CO2 28 07/28/2016 1334   GLUCOSE 146 (H) 09/26/2020 1332   GLUCOSE 160 (H) 07/28/2016 1334   BUN 15 09/26/2020 1332   BUN 20 08/11/2018 1425   BUN 9.5 07/28/2016 1334   CREATININE 1.13 (H) 09/26/2020 1332   CREATININE 1.0 07/28/2016 1334   CALCIUM 9.5  09/26/2020 1332   CALCIUM 9.6 07/28/2016 1334   PROT 7.5 09/26/2020 1332   PROT 7.4 07/28/2016 1334   ALBUMIN 3.6 09/26/2020 1332   ALBUMIN 4.0 07/28/2016 1334   AST 23 09/26/2020 1332   AST 21 07/28/2016 1334   ALT 19 09/26/2020 1332   ALT 24 07/28/2016 1334   ALKPHOS 73 09/26/2020 1332   ALKPHOS 96 07/28/2016 1334   BILITOT 0.7 09/26/2020 1332   BILITOT 1.21 (H)  07/28/2016 1334   GFRNONAA 46 (L) 09/26/2020 1332   GFRAA 50 (L) 10/03/2019 1355    INo results found for: SPEP, UPEP  Lab Results  Component Value Date   WBC 5.4 09/26/2020   NEUTROABS 3.3 09/26/2020   HGB 12.2 09/26/2020   HCT 37.4 09/26/2020   MCV 105.9 (H) 09/26/2020   PLT 213 09/26/2020      Chemistry      Component Value Date/Time   NA 140 09/26/2020 1332   NA 134 08/11/2018 1425   NA 137 07/28/2016 1334   K 3.9 09/26/2020 1332   K 3.8 07/28/2016 1334   CL 104 09/26/2020 1332   CO2 25 09/26/2020 1332   CO2 28 07/28/2016 1334   BUN 15 09/26/2020 1332   BUN 20 08/11/2018 1425   BUN 9.5 07/28/2016 1334   CREATININE 1.13 (H) 09/26/2020 1332   CREATININE 1.0 07/28/2016 1334      Component Value Date/Time   CALCIUM 9.5 09/26/2020 1332   CALCIUM 9.6 07/28/2016 1334   ALKPHOS 73 09/26/2020 1332   ALKPHOS 96 07/28/2016 1334   AST 23 09/26/2020 1332   AST 21 07/28/2016 1334   ALT 19 09/26/2020 1332   ALT 24 07/28/2016 1334   BILITOT 0.7 09/26/2020 1332   BILITOT 1.21 (H) 07/28/2016 1334       No results found for: LABCA2  No components found for: LABCA125  No results for input(s): INR in the last 168 hours.  Urinalysis    Component Value Date/Time   COLORURINE YELLOW 10/07/2018 1535   APPEARANCEUR CLEAR 10/07/2018 1535   LABSPEC 1.012 10/07/2018 1535   PHURINE 6.0 10/07/2018 1535   GLUCOSEU NEGATIVE 10/07/2018 1535   GLUCOSEU NEGATIVE 01/17/2015 1415   HGBUR NEGATIVE 10/07/2018 [place] 10/07/2018 1535   KETONESUR NEGATIVE 10/07/2018 1535   PROTEINUR 30 (A) 10/07/2018 1535   UROBILINOGEN 0.2 01/17/2015 1415   NITRITE NEGATIVE 10/07/2018 1535   LEUKOCYTESUR NEGATIVE 10/07/2018 1535    STUDIES: No results found.   ELIGIBLE FOR AVAILABLE RESEARCH PROTOCOL: no  ASSESSMENT: 87 y.o. [place] woman status post left breast upper outer quadrant biopsy 06/11/2015 for a papillary lesion with unclear margins.  (1) status post left  lumpectomy 07/25/2015 for an mpT2 cN0, stage IIA  invasive ductal carcinoma,  grade I-II  estrogen and progesterone receptor strongly positive, HER-2 not amplified   (a) additional surgery 09/05/2015 found some residual ductal carcinoma in situ but the margins were clear.  (2) no sentinel lymph node sampling or adjuvant radiation planned  (3) adjuvant anastrozole started 08/10/2015, completed May 2022  (4) squamous cell carcinoma of the skin in both lower extremities  (a) started cemiplimab/ Libtayo 07/04/2020, repeated every 21 days x 8  (b) consider cetuximab if Libtayo ineffective or not tolerated   PLAN:  Willow is tolerating her cemiplimab without any side effects that I can elicit.  The question is is not working.  I called the hyperbaric clinic and spoke to Dr. Vernon Prey who has not seen  the patient recently but he went over Dr. Jodene Nam notes and apparently according to that she is improving.  Dr. Heber [place] will be seeing the patient tomorrow and I have asked her to give me a call to confirm that.  Assuming the patient continues to do well the plan is to continue the cemiplimab indefinitely as she is tolerating it very well.  I would go for complete healing post minimum of 3 months beyond that.  Total encounter time 25 minutes.*   Laureen Frederic, Virgie Dad, MD  09/26/20 2:40 PM Medical Oncology and Hematology [facility] [place], [place] [address] Tel. [phone]    Fax. [phone]   I, Wilburn Mylar, am acting as scribe for Dr. Virgie Dad. Bray Vickerman.  I, Lurline Del MD, have reviewed the above documentation for accuracy and completeness, and I agree with the above.   *Total Encounter Time as defined by the Centers for Medicare and Medicaid Services includes, in addition to the face-to-face time of a patient visit (documented in the note above) non-face-to-face time: obtaining and reviewing outside history, ordering and reviewing medications,  tests or procedures, care coordination (communications with other health care professionals or caregivers) and documentation in the medical record.

## 2020-09-26 NOTE — Patient Instructions (Signed)
[facility] MEDICAL [facility]COLOG[facility]  Discharge Instructions: Thank you for choosing [facility] to provide your oncology and hematology care.   If you have a lab appointment with the Cancer Center, please go directly to the Cancer Center and check in at the registration area.   Wear comfortable clothing and clothing appropriate for easy access to any Portacath or PICC line.   We strive to give you quality time with your provider. You may need to reschedule your appointment if you arrive late (15 or more minutes).  Arriving late affects you and other patients whose appointments are after yours.  Also, if you miss three or more appointments without notifying the office, you may be dismissed from the clinic at the provider's discretion.      For prescription refill requests, have your pharmacy contact our office and allow 72 hours for refills to be completed.    Today you received the following chemotherapy and/or immunotherapy agents :  Libtayo      To help prevent nausea and vomiting after your treatment, we encourage you to take your nausea medication as directed.  BELOW ARE SYMPTOMS THAT SHOULD BE REPORTED IMMEDIATELY: *FEVER GREATER THAN 100.4 F (38 C) OR HIGHER *CHILLS OR SWEATING *NAUSEA AND VOMITING THAT IS NOT CONTROLLED WITH YOUR NAUSEA MEDICATION *UNUSUAL SHORTNESS OF BREATH *UNUSUAL BRUISING OR BLEEDING *URINARY PROBLEMS (pain or burning when urinating, or frequent urination) *BOWEL PROBLEMS (unusual diarrhea, constipation, pain near the anus) TENDERNESS IN MOUTH AND THROAT WITH OR WITHOUT PRESENCE OF ULCERS (sore throat, sores in mouth, or a toothache) UNUSUAL RASH, SWELLING OR PAIN  UNUSUAL VAGINAL DISCHARGE OR ITCHING   Items with * indicate a potential emergency and should be followed up as soon as possible or go to the Emergency Department if any problems should occur.  Please show the CHEMOTHERAPY ALERT CARD or IMMUNOTHERAPY ALERT CARD at check-in to  the Emergency Department and triage nurse.  Should you have questions after your visit or need to cancel or reschedule your appointment, please contact [facility] MEDICAL [facility]COL[facility]  Dept: [phone]  and follow the prompts.  Office hours are 8:00 a.m. to 4:30 p.m. Monday - Friday. Please note that voicemails left after 4:00 p.m. may not be returned until the following business day.  We are closed weekends and major holidays. You have access to a nurse at all times for urgent questions. Please call the main number to the clinic Dept: [phone] and follow the prompts.   For any non-urgent questions, you may also contact your provider using MyChart. We now offer e-Visits for anyone 18 and older to request care online for non-urgent symptoms. For details visit [email or web address].   Also download the MyChart app! Go to the app store, search "MyChart", open the app, select [place], and log in with your MyChart username and password.  Due to Covid, a mask is required upon entering the hospital/clinic. If you do not have a mask, one will be given to you upon arrival. For doctor visits, patients may have 1 support person aged 18 or older with them. For treatment visits, patients cannot have anyone with them due to current Covid guidelines and our immunocompromised population.   

## 2020-09-27 ENCOUNTER — Encounter (HOSPITAL_BASED_OUTPATIENT_CLINIC_OR_DEPARTMENT_OTHER): Payer: Medicare Other | Admitting: Internal Medicine

## 2020-09-27 DIAGNOSIS — Z85828 Personal history of other malignant neoplasm of skin: Secondary | ICD-10-CM | POA: Diagnosis not present

## 2020-09-27 DIAGNOSIS — C4492 Squamous cell carcinoma of skin, unspecified: Secondary | ICD-10-CM

## 2020-09-27 DIAGNOSIS — Z853 Personal history of malignant neoplasm of breast: Secondary | ICD-10-CM | POA: Diagnosis not present

## 2020-09-27 DIAGNOSIS — L97819 Non-pressure chronic ulcer of other part of right lower leg with unspecified severity: Secondary | ICD-10-CM | POA: Diagnosis not present

## 2020-09-27 DIAGNOSIS — L97829 Non-pressure chronic ulcer of other part of left lower leg with unspecified severity: Secondary | ICD-10-CM

## 2020-09-27 DIAGNOSIS — I872 Venous insufficiency (chronic) (peripheral): Secondary | ICD-10-CM

## 2020-09-27 NOTE — Progress Notes (Signed)
JESMINE DURRAH ([id number]) , Visit Report for 09/27/2020 Chief Complaint Document Details Patient Name: Date of Service: [name], [place]. 09/27/2020 1:45 PM Medical Record Number: [id number] Patient Account Number: [id number] Date of Birth/Sex: Treating RN: 11/19/1925 (87 y.o. [name], [name] Primary Care Provider: [name] Other Clinician: Referring Provider: Treating Provider/Extender: [name] in Treatment: 15 Information Obtained from: Patient Chief Complaint Bilateral lower extremity wounds that have been biopsied and positive for squamous cell carcinoma Electronic Signature(s) Signed: 09/27/2020 3:45:25 PM By: Kalman Shan DO Entered By: Kalman Shan on 09/27/2020 15:37:28 -------------------------------------------------------------------------------- HPI Details Patient Name: Date of Service: [name], [place]. 09/27/2020 1:45 PM Medical Record Number: [id number] Patient Account Number: [id number] Date of Birth/Sex: Treating RN: January 15, 1926 (87 y.o. [name] Primary Care Provider: [name] Other Clinician: Referring Provider: Treating Provider/Extender: [name] in Treatment: 15 History of Present Illness Location: left leg HPI Description: Admission 5/23 Ms. 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 She states that the wound on the right has been present for a year. The 1 on the left has opened up 1 month ago. She is followed with oncology for this issue as she had biopsies that showed squamous cell carcinoma. She is also seeing radiation oncology for treatment options. She presents today because she would like for her wounds to  be healed by [place]. She currently denies signs of infection. 6/1; patient presents for 1 week follow-up. She states she has tolerated the leg wraps well. She states these do not bother her and is happy to continue with them. She is scheduled to see her oncologist today to go over treatment options for the bilateral lower extremity squamous cell carcinoma. Radiation is currently not a recommended option. Patient states she overall feels well. 6/22; patient presents for 3-week follow-up. She has tolerated the wraps well until her last wrap where she states they were uncomfortable. She attributes this to the home health nurse. She denies signs of infection. She has started her first treatment of antibody infusions for her Bilateral lower extremity squamous cell carcinoma. She has no complaints today. 7/21; patient presents for 1 month follow-up. Unfortunately she has not had good experience with her wrap changes with home health. She would like to do her own dressing changes. She continues to do her antibody infusions. She denies signs of infection. 7/28; patient presents for 1 week follow-up. At last clinic visit she was switched to daily dressing changes due to issues with the wrap and home health placing them. Unfortunately she has developed weeping to her legs bilaterally. She would like to be placed in wraps today. She would also like to follow with [place] weekly for wrap changes instead of having home health change them. She denies signs of infection. 8/4; patient presents for 1 week follow-up. She has tolerated the Kerlix/Coban wraps well. She no longer has weeping to her legs. She took the wrap off 1 day before coming in to be able to take a shower. She has no issues or complaints today. She denies signs of infection. 8/18; patient presents for follow-up. Patient has tolerated the wraps well. She brought her Velcro compression wraps today. She has no issues or complaints today. She had her chemotherapy  infusion  yesterday without issues. She denies signs of infection. 8/25; patient presents for follow-up. She used her juxta lite compressions for the past week. It is unclear if she is able to put these on correctly since she states she has a hard time getting them to look right. She reports 2 open wounds. She currently denies signs of infection. 9/1; patient presents for 1 week follow-up. She has 1 open wound. She tolerated the compression wraps well. She currently denies signs of infection. 9/8; patient presents for 1 week follow-up. She has 2 open wounds 1 on each leg. She has tolerated the compression wrap well. She currently denies signs of infection. Electronic Signature(s) Signed: 09/27/2020 3:45:25 PM By: Kalman Shan DO Entered By: Kalman Shan on 09/27/2020 15:37:56 -------------------------------------------------------------------------------- Physical Exam Details Patient Name: Date of Service: [name], [place]. 09/27/2020 1:45 PM Medical Record Number: [id number] Patient Account Number: [id number] Date of Birth/Sex: Treating RN: 19-Mar-1925 (87 y.o. [name], [name] Primary Care Provider: [name] Other Clinician: Referring Provider: Treating Provider/Extender: [name] in Treatment: 15 Constitutional respirations regular, non-labored and within target range for patient.. Cardiovascular 2+ dorsalis pedis/posterior tibialis pulses. Psychiatric pleasant and cooperative. Notes Patient now has 2 wounds. 1 on each lower extremity. The wounds are limited to skin breakdown. No signs of infection. Previous squamous cell carcinoma lesions appear to have resolved. Electronic Signature(s) Signed: 09/27/2020 3:45:25 PM By: Kalman Shan DO Entered By: Kalman Shan on 09/27/2020 15:40:27 -------------------------------------------------------------------------------- Physician Orders Details Patient Name: Date of  Service: [name], [place]. 09/27/2020 1:45 PM Medical Record Number: [id number] Patient Account Number: [id number] Date of Birth/Sex: Treating RN: 1925/07/28 (87 y.o. [name] Primary Care Provider: [name] Other Clinician: Referring Provider: Treating Provider/Extender: [name] in Treatment: 15 Verbal / Phone Orders: No Diagnosis Coding ICD-10 Coding Code Description C44.92 Squamous cell carcinoma of skin, unspecified L97.819 Non-pressure chronic ulcer of other part of right lower leg with unspecified severity L97.829 Non-pressure chronic ulcer of other part of left lower leg with unspecified severity I87.2 Venous insufficiency (chronic) (peripheral) Follow-up Appointments ppointment in 1 week. - with Dr. Heber [place] Return A Bathing/ Shower/ Hygiene May shower with protection but do not get wound dressing(s) wet. - Use cast protector Edema Control - Lymphedema / SCD / Other Elevate legs to the level of the heart or above for 30 minutes daily and/or when sitting, a frequency of: - elevate the legs throughout the day heart level if possible. Avoid standing for long periods of time. Exercise regularly Additional Orders / Instructions Follow Nutritious Diet Wound Treatment Wound #6R - Lower Leg Wound Laterality: Right, Anterior Cleanser: Soap and Water 1 x Per Week/15 Days Discharge Instructions: May shower and wash wound with dial antibacterial soap and water prior to dressing change. Cleanser: Wound Cleanser 1 x Per Week/15 Days Discharge Instructions: Cleanse the wound with wound cleanser prior to applying a clean dressing using gauze sponges, not tissue or cotton balls. Peri-Wound Care: Triamcinolone 15 (g) 1 x Per Week/15 Days Discharge Instructions: In clinic only. Use triamcinolone 15 (g) in clinic only. Peri-Wound Care: Sween Lotion (Moisturizing lotion) 1 x Per Week/15 Days Discharge Instructions: Apply moisturizing  lotion as directed Prim Dressing: Hydrofera Blue Ready Foam, 2.5 x2.5 in 1 x Per Week/15 Days ary Discharge Instructions: Apply to wound bed as instructed Secondary Dressing: Woven Gauze Sponge, Non-Sterile 4x4 in 1 x Per Week/15 Days Discharge Instructions: Apply over primary dressing as directed. Secondary Dressing:  ABD Pad, 8x10 1 x Per Week/15 Days Discharge Instructions: Apply over primary dressing as directed. Compression Wrap: ThreePress (3 layer compression wrap) 1 x Per Week/15 Days Discharge Instructions: Apply three layer compression as directed. Wound #7R - Lower Leg Wound Laterality: Left, Anterior Cleanser: Soap and Water 1 x Per Week/15 Days Discharge Instructions: May shower and wash wound with dial antibacterial soap and water prior to dressing change. Cleanser: Wound Cleanser 1 x Per Week/15 Days Discharge Instructions: Cleanse the wound with wound cleanser prior to applying a clean dressing using gauze sponges, not tissue or cotton balls. Peri-Wound Care: Triamcinolone 15 (g) 1 x Per Week/15 Days Discharge Instructions: In clinic only. Use triamcinolone 15 (g) in clinic only. Peri-Wound Care: Sween Lotion (Moisturizing lotion) 1 x Per Week/15 Days Discharge Instructions: Apply moisturizing lotion as directed Prim Dressing: Hydrofera Blue Ready Foam, 2.5 x2.5 in 1 x Per Week/15 Days ary Discharge Instructions: Apply to wound bed as instructed Secondary Dressing: Woven Gauze Sponge, Non-Sterile 4x4 in 1 x Per Week/15 Days Discharge Instructions: Apply over primary dressing as directed. Secondary Dressing: ABD Pad, 8x10 1 x Per Week/15 Days Discharge Instructions: Apply over primary dressing as directed. Compression Wrap: ThreePress (3 layer compression wrap) 1 x Per Week/15 Days Discharge Instructions: Apply three layer compression as directed. Wound #8 - Lower Leg Wound Laterality: Right, Lateral Cleanser: Soap and Water 1 x Per Week/15 Days Discharge Instructions: May  shower and wash wound with dial antibacterial soap and water prior to dressing change. Cleanser: Wound Cleanser 1 x Per Week/15 Days Discharge Instructions: Cleanse the wound with wound cleanser prior to applying a clean dressing using gauze sponges, not tissue or cotton balls. Peri-Wound Care: Triamcinolone 15 (g) 1 x Per Week/15 Days Discharge Instructions: In clinic only. Use triamcinolone 15 (g) in clinic only. Peri-Wound Care: Sween Lotion (Moisturizing lotion) 1 x Per Week/15 Days Discharge Instructions: Apply moisturizing lotion as directed Prim Dressing: Hydrofera Blue Ready Foam, 2.5 x2.5 in 1 x Per Week/15 Days ary Discharge Instructions: Apply to wound bed as instructed Secondary Dressing: Woven Gauze Sponge, Non-Sterile 4x4 in 1 x Per Week/15 Days Discharge Instructions: Apply over primary dressing as directed. Secondary Dressing: ABD Pad, 8x10 1 x Per Week/15 Days Discharge Instructions: Apply over primary dressing as directed. Compression Wrap: ThreePress (3 layer compression wrap) 1 x Per Week/15 Days Discharge Instructions: Apply three layer compression as directed. Electronic Signature(s) Signed: 09/27/2020 3:45:25 PM By: Kalman Shan DO Entered By: Kalman Shan on 09/27/2020 15:40:44 -------------------------------------------------------------------------------- Problem List Details Patient Name: Date of Service: [name], [place]. 09/27/2020 1:45 PM Medical Record Number: [id number] Patient Account Number: [id number] Date of Birth/Sex: Treating RN: 1925-11-25 (87 y.o. [name] Primary Care Provider: [name] Other Clinician: Referring Provider: Treating Provider/Extender: [name] in Treatment: 15 Active Problems ICD-10 Encounter Code Description Active Date MDM Diagnosis C44.92 Squamous cell carcinoma of skin, unspecified 06/11/2020 No Yes L97.819 Non-pressure chronic ulcer of other part of right  lower leg with unspecified 06/11/2020 No Yes severity L97.829 Non-pressure chronic ulcer of other part of left lower leg with unspecified 06/11/2020 No Yes severity I87.2 Venous insufficiency (chronic) (peripheral) 06/11/2020 No Yes Inactive Problems Resolved Problems Electronic Signature(s) Signed: 09/27/2020 3:45:25 PM By: Kalman Shan DO Entered By: Kalman Shan on 09/27/2020 15:36:24 -------------------------------------------------------------------------------- Progress Note Details Patient Name: Date of Service: [name], [place]. 09/27/2020 1:45 PM Medical Record Number: [id number] Patient Account Number: [id number] Date of Birth/Sex: Treating RN: 01/29/1925 (87 y.o.  [name] Primary Care Provider: [name] Other Clinician: Referring Provider: Treating Provider/Extender: [name] in Treatment: 15 Subjective Chief Complaint Information obtained from Patient Bilateral lower extremity wounds that have been biopsied and positive for squamous cell carcinoma History of Present Illness (HPI) The following HPI elements were documented for the patient's wound: Location: left leg Admission 5/23 Ms. 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 She states that the wound on the right has been present for a year. The 1 on the left has opened up 1 month ago. She is followed with oncology for this issue as she had biopsies that showed squamous cell carcinoma. She is also seeing radiation oncology for treatment options. She presents today because she would like for her wounds to be healed by [place]. She currently denies signs of infection. 6/1; patient presents for 1 week follow-up. She states she has tolerated  the leg wraps well. She states these do not bother her and is happy to continue with them. She is scheduled to see her oncologist today to go over treatment options for the bilateral lower extremity squamous cell carcinoma. Radiation is currently not a recommended option. Patient states she overall feels well. 6/22; patient presents for 3-week follow-up. She has tolerated the wraps well until her last wrap where she states they were uncomfortable. She attributes this to the home health nurse. She denies signs of infection. She has started her first treatment of antibody infusions for her Bilateral lower extremity squamous cell carcinoma. She has no complaints today. 7/21; patient presents for 1 month follow-up. Unfortunately she has not had good experience with her wrap changes with home health. She would like to do her own dressing changes. She continues to do her antibody infusions. She denies signs of infection. 7/28; patient presents for 1 week follow-up. At last clinic visit she was switched to daily dressing changes due to issues with the wrap and home health placing them. Unfortunately she has developed weeping to her legs bilaterally. She would like to be placed in wraps today. She would also like to follow with [place] weekly for wrap changes instead of having home health change them. She denies signs of infection. 8/4; patient presents for 1 week follow-up. She has tolerated the Kerlix/Coban wraps well. She no longer has weeping to her legs. She took the wrap off 1 day before coming in to be able to take a shower. She has no issues or complaints today. She denies signs of infection. 8/18; patient presents for follow-up. Patient has tolerated the wraps well. She brought her Velcro compression wraps today. She has no issues or complaints today. She had her chemotherapy infusion yesterday without issues. She denies signs of infection. 8/25; patient presents for follow-up. She used her juxta lite  compressions for the past week. It is unclear if she is able to put these on correctly since she states she has a hard time getting them to look right. She reports 2 open wounds. She currently denies signs of infection. 9/1; patient presents for 1 week follow-up. She has 1 open wound. She tolerated the compression wraps well. She currently denies signs of infection. 9/8; patient presents for 1 week follow-up. She has 2 open wounds 1 on each leg. She has tolerated the  compression wrap well. She currently denies signs of infection. Patient History Information obtained from Patient. Family History Unknown History. Social History Never smoker, Marital Status - Single, Alcohol Use - Never, Drug Use - No History, Caffeine Use - Never. Medical History Eyes Denies history of Cataracts, Optic Neuritis Ear/Nose/Mouth/Throat Denies history of Chronic sinus problems/congestion, Middle ear problems Hematologic/Lymphatic Patient has history of Anemia Denies history of Hemophilia, Human Immunodeficiency Virus, Lymphedema, Sickle Cell Disease Respiratory Denies history of Aspiration, Asthma, Chronic Obstructive Pulmonary Disease (COPD), Pneumothorax, Sleep Apnea, Tuberculosis Cardiovascular Patient has history of Arrhythmia - Atrial Flutter, A fibb, Congestive Heart Failure, Hypertension Denies history of Angina, Coronary Artery Disease, Deep Vein Thrombosis, Hypotension, Myocardial Infarction, Peripheral Arterial Disease, Peripheral Venous Disease, Phlebitis, Vasculitis Gastrointestinal Patient has history of Colitis Denies history of Cirrhosis , Crohnoos, Hepatitis A, Hepatitis B, Hepatitis C Endocrine Denies history of Type I Diabetes, Type II Diabetes Genitourinary Denies history of End Stage Renal Disease Immunological Denies history of Lupus Erythematosus, Raynaudoos, Scleroderma Integumentary (Skin) Denies history of History of Burn Musculoskeletal Patient has history of  Osteoarthritis Denies history of Gout, Rheumatoid Arthritis, Osteomyelitis Neurologic Denies history of Dementia, Neuropathy, Quadriplegia, Paraplegia, Seizure Disorder Oncologic Denies history of Received Chemotherapy, Received Radiation Hospitalization/Surgery History - removal of rod left leg. 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 Vitals Time Taken: 2:28 PM, Height: 65 in, Weight: 163 lbs, BMI: 27.1, Temperature: 98.3 F, Pulse: 74 bpm, Respiratory Rate: 16 breaths/min, Blood Pressure: 142/84 mmHg. Cardiovascular 2+ dorsalis pedis/posterior tibialis pulses. Psychiatric pleasant and cooperative. General Notes: Patient now has 2 wounds. 1 on each lower extremity. The wounds are limited to skin breakdown. No signs of infection. Previous squamous cell carcinoma lesions appear to have resolved. Integumentary (Hair, Skin) Wound #6R status is Open. Original cause of wound was Blister. The date acquired was: 04/20/2020. The wound has been in treatment 15 weeks. The wound is located on the Right,Anterior Lower Leg. The wound measures 0.2cm length x 0.2cm width x 0.1cm depth; 0.031cm^2 area and 0.003cm^3 volume. There is Fat Layer (Subcutaneous Tissue) exposed. There is no tunneling or undermining noted. There is a medium amount of serosanguineous drainage noted. The wound margin is distinct with the outline attached to the wound base. There is large (67-100%) red, pink granulation within the wound bed. There is no necrotic tissue within the wound bed. Wound #7R status is Open. Original cause of wound was Blister. The date acquired was: 04/20/2020. The wound has been in treatment 15 weeks. The wound is located on the Left,Anterior Lower Leg. The wound measures 2.5cm length x  1.4cm width x 0.1cm depth; 2.749cm^2 area and 0.275cm^3 volume. There is Fat Layer (Subcutaneous Tissue) exposed. There is no tunneling or undermining noted. There is a medium amount of serosanguineous drainage noted. The wound margin is distinct with the outline attached to the wound base. There is large (67-100%) red, pink granulation within the wound bed. There is a small (1-33%) amount of necrotic tissue within the wound bed including Adherent Slough. Wound #8 status is Open. Original cause of wound was Gradually Appeared. The date acquired was: 09/27/2020. The wound is located on the Right,Lateral Lower Leg. The wound measures 1.2cm length x 0.5cm width x 0.1cm depth; 0.471cm^2 area and 0.047cm^3 volume. There is Fat Layer (Subcutaneous Tissue) exposed. There is no tunneling or undermining noted. There is a medium amount of  serosanguineous drainage noted. The wound margin is flat and intact. There is large (67-100%) pink granulation within the wound bed. There is no necrotic tissue within the wound bed. Assessment Active Problems ICD-10 Squamous cell carcinoma of skin, unspecified Non-pressure chronic ulcer of other part of right lower leg with unspecified severity Non-pressure chronic ulcer of other part of left lower leg with unspecified severity Venous insufficiency (chronic) (peripheral) Patient now has an additional wound to her right lower extremity. Both wounds present are limited to skin breakdown. I think she may need higher level of compression to help with swelling. I will also switch the wound dressing to Hydrofera Blue. The previous squamous cell carcinoma lesions appear stable and resolved. No signs of infection on exam. I think patient may benefit from a diuretic since she has increased swelling and blistering once her compression wraps are taken off. I asked her to discuss this with her primary care provider. She states she has an appointment on 9/12. Procedures Wound  #6R Pre-procedure diagnosis of Wound #6R is a Malignant Wound located on the Right,Anterior Lower Leg . There was a Three Layer Compression Therapy Procedure by Levan Hurst, RN. Post procedure Diagnosis Wound #6R: Same as Pre-Procedure Plan Follow-up Appointments: Return Appointment in 1 week. - with Dr. Heber [place] Bathing/ Shower/ Hygiene: May shower with protection but do not get wound dressing(s) wet. - Use cast protector Edema Control - Lymphedema / SCD / Other: Elevate legs to the level of the heart or above for 30 minutes daily and/or when sitting, a frequency of: - elevate the legs throughout the day heart level if possible. Avoid standing for long periods of time. Exercise regularly Additional Orders / Instructions: Follow Nutritious Diet WOUND #6R: - Lower Leg Wound Laterality: Right, Anterior Cleanser: Soap and Water 1 x Per Week/15 Days Discharge Instructions: May shower and wash wound with dial antibacterial soap and water prior to dressing change. Cleanser: Wound Cleanser 1 x Per Week/15 Days Discharge Instructions: Cleanse the wound with wound cleanser prior to applying a clean dressing using gauze sponges, not tissue or cotton balls. Peri-Wound Care: Triamcinolone 15 (g) 1 x Per Week/15 Days Discharge Instructions: In clinic only. Use triamcinolone 15 (g) in clinic only. Peri-Wound Care: Sween Lotion (Moisturizing lotion) 1 x Per Week/15 Days Discharge Instructions: Apply moisturizing lotion as directed Prim Dressing: Hydrofera Blue Ready Foam, 2.5 x2.5 in 1 x Per Week/15 Days ary Discharge Instructions: Apply to wound bed as instructed Secondary Dressing: Woven Gauze Sponge, Non-Sterile 4x4 in 1 x Per Week/15 Days Discharge Instructions: Apply over primary dressing as directed. Secondary Dressing: ABD Pad, 8x10 1 x Per Week/15 Days Discharge Instructions: Apply over primary dressing as directed. Com pression Wrap: ThreePress (3 layer compression wrap) 1 x Per Week/15  Days Discharge Instructions: Apply three layer compression as directed. WOUND #7R: - Lower Leg Wound Laterality: Left, Anterior Cleanser: Soap and Water 1 x Per Week/15 Days Discharge Instructions: May shower and wash wound with dial antibacterial soap and water prior to dressing change. Cleanser: Wound Cleanser 1 x Per Week/15 Days Discharge Instructions: Cleanse the wound with wound cleanser prior to applying a clean dressing using gauze sponges, not tissue or cotton balls. Peri-Wound Care: Triamcinolone 15 (g) 1 x Per Week/15 Days Discharge Instructions: In clinic only. Use triamcinolone 15 (g) in clinic only. Peri-Wound Care: Sween Lotion (Moisturizing lotion) 1 x Per Week/15 Days Discharge Instructions: Apply moisturizing lotion as directed Prim Dressing: Hydrofera Blue Ready Foam, 2.5 x2.5 in 1 x Per Week/15  Days ary Discharge Instructions: Apply to wound bed as instructed Secondary Dressing: Woven Gauze Sponge, Non-Sterile 4x4 in 1 x Per Week/15 Days Discharge Instructions: Apply over primary dressing as directed. Secondary Dressing: ABD Pad, 8x10 1 x Per Week/15 Days Discharge Instructions: Apply over primary dressing as directed. Com pression Wrap: ThreePress (3 layer compression wrap) 1 x Per Week/15 Days Discharge Instructions: Apply three layer compression as directed. WOUND #8: - Lower Leg Wound Laterality: Right, Lateral Cleanser: Soap and Water 1 x Per Week/15 Days Discharge Instructions: May shower and wash wound with dial antibacterial soap and water prior to dressing change. Cleanser: Wound Cleanser 1 x Per Week/15 Days Discharge Instructions: Cleanse the wound with wound cleanser prior to applying a clean dressing using gauze sponges, not tissue or cotton balls. Peri-Wound Care: Triamcinolone 15 (g) 1 x Per Week/15 Days Discharge Instructions: In clinic only. Use triamcinolone 15 (g) in clinic only. Peri-Wound Care: Sween Lotion (Moisturizing lotion) 1 x Per Week/15  Days Discharge Instructions: Apply moisturizing lotion as directed Prim Dressing: Hydrofera Blue Ready Foam, 2.5 x2.5 in 1 x Per Week/15 Days ary Discharge Instructions: Apply to wound bed as instructed Secondary Dressing: Woven Gauze Sponge, Non-Sterile 4x4 in 1 x Per Week/15 Days Discharge Instructions: Apply over primary dressing as directed. Secondary Dressing: ABD Pad, 8x10 1 x Per Week/15 Days Discharge Instructions: Apply over primary dressing as directed. Com pression Wrap: ThreePress (3 layer compression wrap) 1 x Per Week/15 Days Discharge Instructions: Apply three layer compression as directed. 1. Hydrofera Blue under 3 layer compression 2. Follow-up in 1 week Electronic Signature(s) Signed: 09/27/2020 3:45:25 PM By: Kalman Shan DO Entered By: Kalman Shan on 09/27/2020 15:45:02 -------------------------------------------------------------------------------- HxROS Details Patient Name: Date of Service: [name], Adeline. 09/27/2020 1:45 PM Medical Record Number: [id number] Patient Account Number: [id number] Date of Birth/Sex: Treating RN: Aug 12, 1925 (87 y.o. [name] Primary Care Provider: [name] Other Clinician: Referring Provider: Treating Provider/Extender: [name] in Treatment: 15 Information Obtained From Patient Eyes Medical History: Negative for: Cataracts; Optic Neuritis Ear/Nose/Mouth/Throat Medical History: Negative for: Chronic sinus problems/congestion; Middle ear problems Hematologic/Lymphatic Medical History: Positive for: Anemia Negative for: Hemophilia; Human Immunodeficiency Virus; Lymphedema; Sickle Cell Disease Respiratory Medical History: Negative for: Aspiration; Asthma; Chronic Obstructive Pulmonary Disease (COPD); Pneumothorax; Sleep Apnea; Tuberculosis Cardiovascular Medical History: Positive for: Arrhythmia - Atrial Flutter, A fibb; Congestive Heart Failure;  Hypertension Negative for: Angina; Coronary Artery Disease; Deep Vein Thrombosis; Hypotension; Myocardial Infarction; Peripheral Arterial Disease; Peripheral Venous Disease; Phlebitis; Vasculitis Past Medical History Notes: hyperlipidemia Gastrointestinal Medical History: Positive for: Colitis Negative for: Cirrhosis ; Crohns; Hepatitis A; Hepatitis B; Hepatitis C Endocrine Medical History: Negative for: Type I Diabetes; Type II Diabetes Past Medical History Notes: hypothyroidism Genitourinary Medical History: Negative for: End Stage Renal Disease Immunological Medical History: Negative for: Lupus Erythematosus; Raynauds; Scleroderma Integumentary (Skin) Medical History: Negative for: History of Burn Musculoskeletal Medical History: Positive for: Osteoarthritis Negative for: Gout; Rheumatoid Arthritis; Osteomyelitis Neurologic Medical History: Negative for: Dementia; Neuropathy; Quadriplegia; Paraplegia; Seizure Disorder Past Medical History Notes: lumbar spindylolysis Oncologic Medical History: Negative for: Received Chemotherapy; Received Radiation Past Medical History Notes: BLE squamous ceel carcionoma Immunizations Pneumococcal Vaccine: Received Pneumococcal Vaccination: Yes Received Pneumococcal Vaccination On or After 60th Birthday: No Implantable Devices None Hospitalization / Surgery History Type of Hospitalization/Surgery removal of rod left leg Family and Social History Unknown History: Yes; Never smoker; Marital Status - Single; Alcohol Use: Never; Drug Use: No History; Caffeine Use: Never; Financial  Concerns: No; Food, Clothing or Shelter Needs: No; Support System Lacking: No; Transportation Concerns: No Electronic Signature(s) Signed: 09/27/2020 3:45:25 PM By: Kalman Shan DO Signed: 09/27/2020 6:08:32 PM By: Rhae Hammock RN Entered By: Kalman Shan on 09/27/2020  15:38:04 -------------------------------------------------------------------------------- SuperBill Details Patient Name: Date of Service: [name], Tashiana A. 09/27/2020 Medical Record Number: [id number] Patient Account Number: [id number] Date of Birth/Sex: Treating RN: 05/20/1925 (87 y.o. 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 Modifier: Quantity: 1 Physician Procedures : CPT4 Code Description Modifier [id number] - WC PHYS LEVEL 3 - EST PT ICD-10 Diagnosis Description C44.92 Squamous cell carcinoma of skin, unspecified L97.819 Non-pressure chronic ulcer of other part of right lower leg with unspecified severity  L97.829 Non-pressure chronic ulcer of other part of left lower leg with unspecified severity I87.2 Venous insufficiency (chronic) (peripheral) Quantity: 1 Electronic Signature(s) Signed: 09/27/2020 5:44:20 PM By: Kalman Shan DO Signed: 09/27/2020 6:29:12 PM By: Levan Hurst RN, BSN Previous Signature: 09/27/2020 3:45:25 PM Version By: Kalman Shan DO Entered By: Levan Hurst on 09/27/2020 17:33:53

## 2020-09-27 NOTE — Progress Notes (Signed)
[name] ([id number]) , Visit Report for 09/27/2020 Arrival Information Details Patient Name: Date of Service: [name]. 09/27/2020 1:45 PM Medical Record Number: [id number] Patient Account Number: [id number] Date of Birth/Sex: Treating RN: 02/09/1925 (87 y.o. [name] Primary Care Yilia Sacca: [name] Other Clinician: Referring Tyra Michelle: Treating Catharine Kettlewell/Extender: [name] in Treatment: 15 Visit Information History Since Last Visit Added or deleted any medications: No Patient Arrived: Walker Any new allergies or adverse reactions: No Arrival Time: 14:28 Had a fall or experienced change in No Accompanied By: alone activities of daily living that may affect Transfer Assistance: None risk of falls: Patient Identification Verified: Yes Signs or symptoms of abuse/neglect since last visito No Secondary Verification Process Completed: Yes Hospitalized since last visit: No Patient Requires Transmission-Based No Implantable device outside of the clinic excluding No Precautions: cellular tissue based products placed in the center Patient Has Alerts: Yes since last visit: Patient Alerts: R ABI = Non compressible Has Dressing in Place as Prescribed: Yes L ABI = Non compressible Has Compression in Place as Prescribed: Yes Pain Present Now: No Electronic Signature(s) Signed: 09/27/2020 6:29:12 PM By: Levan Hurst RN, BSN Entered By: Levan Hurst on 09/27/2020 14:28:44 -------------------------------------------------------------------------------- Compression Therapy Details Patient Name: Date of Service: [name] ETT, [name] 09/27/2020 1:45 PM Medical Record Number: [id number] Patient Account Number: [id number] Date of Birth/Sex: Treating RN: 01-07-1926 (87 y.o. [name] Primary Care Cyani Kallstrom: [name] Other Clinician: Referring Maxum Cassarino: Treating Devika Dragovich/Extender: [name] in Treatment: 15 Compression Therapy Performed for Wound Assessment: Wound #6R Right,Anterior Lower Leg Performed By: Clinician Levan Hurst, RN Compression Type: Three Layer Post Procedure Diagnosis Same as Pre-procedure Electronic Signature(s) Signed: 09/27/2020 6:29:12 PM By: Levan Hurst RN, BSN Entered By: Levan Hurst on 09/27/2020 14:57:41 -------------------------------------------------------------------------------- Compression Therapy Details Patient Name: Date of Service: [name] ETT, [name] 09/27/2020 1:45 PM Medical Record Number: [id number] Patient Account Number: [id number] Date of Birth/Sex: Treating RN: [birth year] (87 y.o. [name] Primary Care Kaylie Ritter: [name] Other Clinician: Referring Avinash Maltos: Treating Syvilla Martin/Extender: [name] in Treatment: 15 Compression Therapy Performed for Wound Assessment: Wound #8 Right,Lateral Lower Leg Performed By: Clinician Levan Hurst, RN Compression Type: Three Layer Post Procedure Diagnosis Same as Pre-procedure Electronic Signature(s) Signed: 09/27/2020 6:29:12 PM By: Levan Hurst RN, BSN Entered By: Levan Hurst on 09/27/2020 17:33:39 -------------------------------------------------------------------------------- Compression Therapy Details Patient Name: Date of Service: [name] ETT, [name] [place]. 09/27/2020 1:45 PM Medical Record Number: [id number] Patient Account Number: [id number] Date of Birth/Sex: Treating RN: 01-30-1925 (87 y.o. [name] Primary Care Harlie Ragle: [name] Other Clinician: Referring Kiylee Thoreson: Treating Kyshon Tolliver/Extender: [name] in Treatment: 15 Compression Therapy Performed for Wound Assessment: Wound #7R Left,Anterior Lower Leg Performed By: Clinician Levan Hurst, RN Compression Type: Three Layer Post Procedure Diagnosis Same as Pre-procedure Electronic  Signature(s) Signed: 09/27/2020 6:29:12 PM By: Levan Hurst RN, BSN Entered By: Levan Hurst on 09/27/2020 17:33:39 -------------------------------------------------------------------------------- Encounter Discharge Information Details Patient Name: Date of Service: [name] ETT, [name] 09/27/2020 1:45 PM Medical Record Number: [id number] Patient Account Number: [id number] Date of Birth/Sex: Treating RN: 21-Jul-1925 (87 y.o. [name] Primary Care Ajax Schroll: [name] Other Clinician: Referring Makaria Poarch: Treating Shonte Beutler/Extender: [name] in Treatment: 15 Encounter Discharge Information Items Discharge Condition: Stable Ambulatory Status: Walker Discharge Destination: Home Transportation: Private Auto Schedule Follow-up Appointment: Yes Clinical Summary of Care: Patient Declined  Electronic Signature(s) Signed: 09/27/2020 6:29:12 PM By: Levan Hurst RN, BSN Entered By: Levan Hurst on 09/27/2020 17:34:23 -------------------------------------------------------------------------------- Lower Extremity Assessment Details Patient Name: Date of Service: [name] ETT, [name] 09/27/2020 1:45 PM Medical Record Number: [id number] Patient Account Number: [id number] Date of Birth/Sex: Treating RN: 02/04/1925 (87 y.o. [name] Primary Care Lantz Hermann: [name] Other Clinician: Referring Alishia Lebo: Treating Trinidad Petron/Extender: [name] in Treatment: 15 Edema Assessment Assessed: [Left: No] [Right: No] Edema: [Left: Yes] [Right: Yes] Calf Left: Right: Point of Measurement: 30 cm From Medial Instep 34 cm 32.5 cm Ankle Left: Right: Point of Measurement: 9 cm From Medial Instep 22.5 cm 21.5 cm Electronic Signature(s) Signed: 09/27/2020 6:29:12 PM By: Levan Hurst RN, BSN Entered By: Levan Hurst on 09/27/2020  14:41:24 -------------------------------------------------------------------------------- Multi Wound Chart Details Patient Name: Date of Service: [name] ETT, [name] 09/27/2020 1:45 PM Medical Record Number: [id number] Patient Account Number: [id number] Date of Birth/Sex: Treating RN: 01/15/1926 (87 y.o. [name], [name] Primary Care Janyra Barillas: [name] Other Clinician: Referring Mertie Haslem: Treating Santos Sollenberger/Extender: [name] in Treatment: 15 Vital Signs Height(in): 65 Pulse(bpm): 74 Weight(lbs): 163 Blood Pressure(mmHg): 142/84 Body Mass Index(BMI): 27 Temperature(F): 98.3 Respiratory Rate(breaths/min): 16 Photos: [6R:Right, Anterior Lower Leg] [7R:Left, Anterior Lower Leg] [8:Right, Lateral Lower Leg] Wound Location: [6R:Blister] [7R:Blister] [8:Gradually Appeared] Wounding Event: [6R:Malignant Wound] [7R:Malignant Wound] [8:Venous Leg Ulcer] Primary Etiology: [6R:Anemia, Arrhythmia, Congestive Heart Anemia, Arrhythmia, Congestive Heart Anemia, Arrhythmia, Congestive Heart] Comorbid History: [6R:Failure, Hypertension, Colitis, Osteoarthritis 04/20/2020] [7R:Failure, Hypertension, Colitis, Osteoarthritis 04/20/2020] [8:Failure, Hypertension, Colitis, Osteoarthritis 09/27/2020] Date Acquired: [6R:15] [7R:15] [8:0] Weeks of Treatment: [6R:Open] [7R:Open] [8:Open] Wound Status: [6R:Yes] [7R:Yes] [8:No] Wound Recurrence: [6R:0.2x0.2x0.1] [7R:2.5x1.4x0.1] [8:1.2x0.5x0.1] Measurements L x W x D (cm) [6R:0.031] [7R:2.749] [8:0.471] A (cm) : rea [6R:0.003] [7R:0.275] [8:0.047] Volume (cm) : [6R:99.50%] [7R:10.50%] [8:N/A] % Reduction in Area: [6R:99.80%] [7R:10.40%] [8:N/A] % Reduction in Volume: [6R:Full Thickness Without Exposed] [7R:Full Thickness Without Exposed] [8:Full Thickness Without Exposed] Classification: [6R:Support Structures Medium] [7R:Support Structures Medium] [8:Support Structures Medium] Exudate Amount:  [6R:Serosanguineous] [7R:Serosanguineous] [8:Serosanguineous] Exudate Type: [6R:red, brown] [7R:red, brown] [8:red, brown] Exudate Color: [6R:Distinct, outline attached] [7R:Distinct, outline attached] [8:Flat and Intact] Wound Margin: [6R:Large (67-100%)] [7R:Large (67-100%)] [8:Large (67-100%)] Granulation Amount: [6R:Red, Pink] [7R:Red, Pink] [8:Pink] Granulation Quality: [6R:None Present (0%)] [7R:Small (1-33%)] [8:None Present (0%)] Necrotic Amount: [6R:Fat Layer (Subcutaneous Tissue): Yes Fat Layer (Subcutaneous Tissue): Yes Fat Layer (Subcutaneous Tissue): Yes] Exposed Structures: [6R:Fascia: No Tendon: No Muscle: No Joint: No Bone: No Medium (34-66%)] [7R:Fascia: No Tendon: No Muscle: No Joint: No Bone: No None] [8:Fascia: No Tendon: No Muscle: No Joint: No Bone: No None] Epithelialization: [6R:Compression Therapy] [7R:N/A] [8:N/A] Treatment Notes Electronic Signature(s) Signed: 09/27/2020 3:45:25 PM By: Kalman Shan DO Signed: 09/27/2020 6:08:32 PM By: Rhae Hammock RN Entered By: Kalman Shan on 09/27/2020 15:37:18 -------------------------------------------------------------------------------- Multi-Disciplinary Care Plan Details Patient Name: Date of Service: [name] ETT, [place] A. 09/27/2020 1:45 PM Medical Record Number: [id number] Patient Account Number: [id number] Date of Birth/Sex: Treating RN: [birth year] (87 y.o. [name] Primary Care Jacqulynn Shappell: [name] Other Clinician: Referring Karalynn Cottone: Treating Mariah Harn/Extender: [name] in Treatment: [place] reviewed with physician Active Inactive Malignancy/Atypical Etiology Nursing Diagnoses: Knowledge deficit related to disease process and management of malignancy Goals: Patient/caregiver will verbalize understanding of disease process and disease management of atypical ulcer etiology Date Initiated: 06/11/2020 Date Inactivated:  07/11/2020 Target Resolution Date: 07/06/2020 Goal Status: Met Patient/caregiver will verbalize understanding of disease process and  disease management of malignancy Date Initiated: 06/11/2020 Target Resolution Date: 10/06/2020 Goal Status: Active Interventions: Assess patient and family medical history for signs and symptoms of malignancy/atypical etiology upon admission Provide education on atypical ulcer etiologies Provide education on malignant ulcerations Notes: Wound/Skin Impairment Nursing Diagnoses: Impaired tissue integrity Knowledge deficit related to ulceration/compromised skin integrity Goals: Patient/caregiver will verbalize understanding of skin care regimen Date Initiated: 06/11/2020 Target Resolution Date: 10/11/2020 Goal Status: Active Interventions: Assess patient/caregiver ability to obtain necessary supplies Assess patient/caregiver ability to perform ulcer/skin care regimen upon admission and as needed Assess ulceration(s) every visit Provide education on ulcer and skin care Notes: Electronic Signature(s) Signed: 09/27/2020 6:29:12 PM By: Levan Hurst RN, BSN Entered By: Levan Hurst on 09/27/2020 17:32:45 -------------------------------------------------------------------------------- Pain Assessment Details Patient Name: Date of Service: [name] ETT, [place]. 09/27/2020 1:45 PM Medical Record Number: [id number] Patient Account Number: [id number] Date of Birth/Sex: Treating RN: [birth year] (87 y.o. [name] Primary Care Dorena Dorfman: [name] Other Clinician: Referring Naveen Clardy: Treating Sallyann Kinnaird/Extender: [name] in Treatment: 15 Active Problems Location of Pain Severity and Description of Pain Patient Has Paino No Site Locations Pain Management and Medication Current Pain Management: Electronic Signature(s) Signed: 09/27/2020 6:29:12 PM By: Levan Hurst RN, BSN Entered By: Levan Hurst on 09/27/2020  14:29:05 -------------------------------------------------------------------------------- Patient/Caregiver Education Details Patient Name: Date of Service: [name] ETT, [place] A. 9/8/2022andnbsp1:45 PM Medical Record Number: [id number] Patient Account Number: [id number] Date of Birth/Gender: Treating RN: 01-Oct-1925 (87 y.o. [name] Primary Care Physician: [name] Other Clinician: Referring Physician: Treating Physician/Extender: [name] in Treatment: 15 Education Assessment Education Provided To: Patient Education Topics Provided Wound/Skin Impairment: Methods: Explain/Verbal Responses: State content correctly [employer]) Signed: 09/27/2020 6:29:12 PM By: Levan Hurst RN, BSN Entered By: Levan Hurst on 09/27/2020 17:32:55 -------------------------------------------------------------------------------- Wound Assessment Details Patient Name: Date of Service: [name] ETT, [place]. 09/27/2020 1:45 PM Medical Record Number: [id number] Patient Account Number: [id number] Date of Birth/Sex: Treating RN: September 19, 1925 (87 y.o. [name] Primary Care Syretta Kochel: [name] Other Clinician: Referring Naftoli Penny: Treating Valente Fosberg/Extender: [name] in Treatment: 15 Wound Status Wound Number: 6R Primary Malignant Wound Etiology: Wound Location: Right, Anterior Lower Leg Wound Open Wounding Event: Blister Status: Date Acquired: 04/20/2020 Comorbid Anemia, Arrhythmia, Congestive Heart Failure, Hypertension, Weeks Of Treatment: 15 History: Colitis, Osteoarthritis Clustered Wound: No Photos Wound Measurements Length: (cm) 0.2 Width: (cm) 0.2 Depth: (cm) 0.1 Area: (cm) 0.031 Volume: (cm) 0.003 % Reduction in Area: 99.5% % Reduction in Volume: 99.8% Epithelialization: Medium (34-66%) Tunneling: No Undermining: No Wound Description Classification: Full Thickness  Without Exposed Support Structures Wound Margin: Distinct, outline attached Exudate Amount: Medium Exudate Type: Serosanguineous Exudate Color: red, brown Foul Odor After Cleansing: No Slough/Fibrino Yes Wound Bed Granulation Amount: Large (67-100%) Exposed Structure Granulation Quality: Red, Pink Fascia Exposed: No Necrotic Amount: None Present (0%) Fat Layer (Subcutaneous Tissue) Exposed: Yes Tendon Exposed: No Muscle Exposed: No Joint Exposed: No Bone Exposed: No Treatment Notes Wound #6R (Lower Leg) Wound Laterality: Right, Anterior Cleanser Soap and Water Discharge Instruction: May shower and wash wound with dial antibacterial soap and water prior to dressing change. Wound Cleanser Discharge Instruction: Cleanse the wound with wound cleanser prior to applying a clean dressing using gauze sponges, not tissue or cotton balls. Peri-Wound Care Triamcinolone 15 (g) Discharge Instruction: In clinic only. Use triamcinolone 15 (g) in clinic only. Sween Lotion (Moisturizing lotion) Discharge Instruction: Apply moisturizing lotion as directed Topical  Primary Dressing Hydrofera Blue Ready Foam, 2.5 x2.5 in Discharge Instruction: Apply to wound bed as instructed Secondary Dressing Woven Gauze Sponge, Non-Sterile 4x4 in Discharge Instruction: Apply over primary dressing as directed. ABD Pad, 8x10 Discharge Instruction: Apply over primary dressing as directed. Secured With Compression Wrap ThreePress (3 layer compression wrap) Discharge Instruction: Apply three layer compression as directed. Compression Stockings Add-Ons Electronic Signature(s) Signed: 09/27/2020 6:29:12 PM By: Levan Hurst RN, BSN Entered By: Levan Hurst on 09/27/2020 14:38:10 -------------------------------------------------------------------------------- Wound Assessment Details Patient Name: Date of Service: [name] ETT, [place]. 09/27/2020 1:45 PM Medical Record Number: [id number] Patient Account Number:  [id number] Date of Birth/Sex: Treating RN: Feb 05, 1925 (87 y.o. [name] Primary Care Kieley Akter: [name] Other Clinician: Referring Janiesha Diehl: Treating Raimi Guillermo/Extender: [name] in Treatment: 15 Wound Status Wound Number: 7R Primary Malignant Wound Etiology: Wound Location: Left, Anterior Lower Leg Wound Open Wounding Event: Blister Status: Date Acquired: 04/20/2020 Comorbid Anemia, Arrhythmia, Congestive Heart Failure, Hypertension, Weeks Of Treatment: 15 History: Colitis, Osteoarthritis Clustered Wound: No Photos Wound Measurements Length: (cm) 2.5 Width: (cm) 1.4 Depth: (cm) 0.1 Area: (cm) 2.749 Volume: (cm) 0.275 % Reduction in Area: 10.5% % Reduction in Volume: 10.4% Epithelialization: None Tunneling: No Undermining: No Wound Description Classification: Full Thickness Without Exposed Support Structures Wound Margin: Distinct, outline attached Exudate Amount: Medium Exudate Type: Serosanguineous Exudate Color: red, brown Foul Odor After Cleansing: No Slough/Fibrino Yes Wound Bed Granulation Amount: Large (67-100%) Exposed Structure Granulation Quality: Red, Pink Fascia Exposed: No Necrotic Amount: Small (1-33%) Fat Layer (Subcutaneous Tissue) Exposed: Yes Necrotic Quality: Adherent Slough Tendon Exposed: No Muscle Exposed: No Joint Exposed: No Bone Exposed: No Treatment Notes Wound #7R (Lower Leg) Wound Laterality: Left, Anterior Cleanser Soap and Water Discharge Instruction: May shower and wash wound with dial antibacterial soap and water prior to dressing change. Wound Cleanser Discharge Instruction: Cleanse the wound with wound cleanser prior to applying a clean dressing using gauze sponges, not tissue or cotton balls. Peri-Wound Care Triamcinolone 15 (g) Discharge Instruction: In clinic only. Use triamcinolone 15 (g) in clinic only. Sween Lotion (Moisturizing lotion) Discharge Instruction:  Apply moisturizing lotion as directed Topical Primary Dressing Hydrofera Blue Ready Foam, 2.5 x2.5 in Discharge Instruction: Apply to wound bed as instructed Secondary Dressing Woven Gauze Sponge, Non-Sterile 4x4 in Discharge Instruction: Apply over primary dressing as directed. ABD Pad, 8x10 Discharge Instruction: Apply over primary dressing as directed. Secured With Compression Wrap ThreePress (3 layer compression wrap) Discharge Instruction: Apply three layer compression as directed. Compression Stockings Add-Ons Electronic Signature(s) Signed: 09/27/2020 6:29:12 PM By: Levan Hurst RN, BSN Entered By: Levan Hurst on 09/27/2020 14:39:21 -------------------------------------------------------------------------------- Wound Assessment Details Patient Name: Date of Service: [name] ETT, [place]. 09/27/2020 1:45 PM Medical Record Number: [id number] Patient Account Number: [id number] Date of Birth/Sex: Treating RN: 07-Jul-1925 (87 y.o. [name] Primary Care Grayden Burley: [name] Other Clinician: Referring Danell Vazquez: Treating Nicolae Vasek/Extender: [name] in Treatment: 15 Wound Status Wound Number: 8 Primary Venous Leg Ulcer Etiology: Wound Location: Right, Lateral Lower Leg Wound Open Wounding Event: Gradually Appeared Status: Date Acquired: 09/27/2020 Comorbid Anemia, Arrhythmia, Congestive Heart Failure, Hypertension, Weeks Of Treatment: 0 History: Colitis, Osteoarthritis Clustered Wound: No Photos Wound Measurements Length: (cm) 1.2 Width: (cm) 0.5 Depth: (cm) 0.1 Area: (cm) 0.471 Volume: (cm) 0.047 % Reduction in Area: % Reduction in Volume: Epithelialization: None Tunneling: No Undermining: No Wound Description Classification: Full Thickness Without Exposed Support Structures Wound Margin: Flat and Intact Exudate Amount:  Medium Exudate Type: Serosanguineous Exudate Color: red, brown Foul Odor After  Cleansing: No Slough/Fibrino No Wound Bed Granulation Amount: Large (67-100%) Exposed Structure Granulation Quality: Pink Fascia Exposed: No Necrotic Amount: None Present (0%) Fat Layer (Subcutaneous Tissue) Exposed: Yes Tendon Exposed: No Muscle Exposed: No Joint Exposed: No Bone Exposed: No Treatment Notes Wound #8 (Lower Leg) Wound Laterality: Right, Lateral Cleanser Soap and Water Discharge Instruction: May shower and wash wound with dial antibacterial soap and water prior to dressing change. Wound Cleanser Discharge Instruction: Cleanse the wound with wound cleanser prior to applying a clean dressing using gauze sponges, not tissue or cotton balls. Peri-Wound Care Triamcinolone 15 (g) Discharge Instruction: In clinic only. Use triamcinolone 15 (g) in clinic only. Sween Lotion (Moisturizing lotion) Discharge Instruction: Apply moisturizing lotion as directed Topical Primary Dressing Hydrofera Blue Ready Foam, 2.5 x2.5 in Discharge Instruction: Apply to wound bed as instructed Secondary Dressing Woven Gauze Sponge, Non-Sterile 4x4 in Discharge Instruction: Apply over primary dressing as directed. ABD Pad, 8x10 Discharge Instruction: Apply over primary dressing as directed. Secured With Compression Wrap ThreePress (3 layer compression wrap) Discharge Instruction: Apply three layer compression as directed. Compression Stockings Add-Ons Electronic Signature(s) Signed: 09/27/2020 6:29:12 PM By: Levan Hurst RN, BSN Entered By: Levan Hurst on 09/27/2020 14:40:54 -------------------------------------------------------------------------------- [place] Details Patient Name: Date of Service: [name] ETT, [place]. 09/27/2020 1:45 PM Medical Record Number: [id number] Patient Account Number: [id number] Date of Birth/Sex: Treating RN: 02-Dec-1925 (87 y.o. [name] Primary Care Mairany Bruno: [name] Other Clinician: Referring Exander Shaul: Treating Lasharon Dunivan/Extender:  [name] in Treatment: 15 Vital Signs Time Taken: 14:28 Temperature (F): 98.3 Height (in): 65 Pulse (bpm): 74 Weight (lbs): 163 Respiratory Rate (breaths/min): 16 Body Mass Index (BMI): 27.1 Blood Pressure (mmHg): 142/84 Reference Range: 80 - 120 mg / dl Electronic Signature(s) Signed: 09/27/2020 6:29:12 PM By: Levan Hurst RN, BSN Entered By: Levan Hurst on 09/27/2020 14:29:00

## 2020-10-02 ENCOUNTER — Telehealth: Payer: Self-pay | Admitting: Internal Medicine

## 2020-10-02 NOTE — Telephone Encounter (Signed)
Cancer on both legs, hard time with healing due to excessive swelling  Patient goes to wound center, her wound center doctor is asking if Dr. Alain Marion would give her a prescription for water loss  [place], [place] Phone:  [phone]  Fax:  [phone]     Please call the patient [phone]

## 2020-10-04 ENCOUNTER — Other Ambulatory Visit: Payer: Self-pay

## 2020-10-04 ENCOUNTER — Encounter (HOSPITAL_BASED_OUTPATIENT_CLINIC_OR_DEPARTMENT_OTHER): Payer: Medicare Other | Admitting: Internal Medicine

## 2020-10-04 DIAGNOSIS — L97829 Non-pressure chronic ulcer of other part of left lower leg with unspecified severity: Secondary | ICD-10-CM | POA: Diagnosis not present

## 2020-10-04 DIAGNOSIS — L97819 Non-pressure chronic ulcer of other part of right lower leg with unspecified severity: Secondary | ICD-10-CM

## 2020-10-04 DIAGNOSIS — I872 Venous insufficiency (chronic) (peripheral): Secondary | ICD-10-CM | POA: Diagnosis not present

## 2020-10-04 DIAGNOSIS — Z85828 Personal history of other malignant neoplasm of skin: Secondary | ICD-10-CM | POA: Diagnosis not present

## 2020-10-04 DIAGNOSIS — C4492 Squamous cell carcinoma of skin, unspecified: Secondary | ICD-10-CM

## 2020-10-04 DIAGNOSIS — Z853 Personal history of malignant neoplasm of breast: Secondary | ICD-10-CM | POA: Diagnosis not present

## 2020-10-04 DIAGNOSIS — Z23 Encounter for immunization: Secondary | ICD-10-CM | POA: Diagnosis not present

## 2020-10-04 MED ORDER — FUROSEMIDE 40 MG PO TABS: 40.0000 mg | ORAL_TABLET | Freq: Every day | ORAL | 5 refills | Status: DC | PRN

## 2020-10-04 NOTE — Telephone Encounter (Signed)
Called  pt there was no answer x's 10 rings.. will call back later.Marland KitchenJohny Chess

## 2020-10-04 NOTE — Progress Notes (Signed)
[name] ([id number]) , Visit Report for 10/04/2020 Chief Complaint Document Details Patient Name: Date of Service: [name], [place] 10/04/2020 11:15 A M Medical Record Number: [id number] Patient Account Number: [id number] Date of Birth/Sex: Treating RN: 1925/06/19 (87 y.o. [name], [name] Primary Care Provider: [name] Other Clinician: Referring Provider: Treating Provider/Extender: [name] in Treatment: 16 Information Obtained from: Patient Chief Complaint Bilateral lower extremity wounds that have been biopsied and positive for squamous cell carcinoma Electronic Signature(s) Signed: 10/04/2020 1:23:01 PM By: Kalman Shan DO Entered By: Kalman Shan on 10/04/2020 13:13:31 -------------------------------------------------------------------------------- HPI Details Patient Name: Date of Service: [name], [place]. 10/04/2020 11:15 A M Medical Record Number: [id number] Patient Account Number: [id number] Date of Birth/Sex: Treating RN: 03-03-1925 (87 y.o. [name] Primary Care Provider: [name] Other Clinician: Referring Provider: Treating Provider/Extender: [name] in Treatment: 16 History of Present Illness Location: left leg HPI Description: Admission 5/23 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 She states that the wound on the right has been present for a year. The 1 on the left has opened up 1 month ago. She is followed with oncology for this issue as she had biopsies that showed squamous cell carcinoma. She is also seeing radiation oncology for treatment options. She presents today because she would like for her wounds to  be healed by [place]. She currently denies signs of infection. 6/1; patient presents for 1 week follow-up. She states she has tolerated the leg wraps well. She states these do not bother her and is happy to continue with them. She is scheduled to see her oncologist today to go over treatment options for the bilateral lower extremity squamous cell carcinoma. Radiation is currently not a recommended option. Patient states she overall feels well. 6/22; patient presents for 3-week follow-up. She has tolerated the wraps well until her last wrap where she states they were uncomfortable. She attributes this to the home health nurse. She denies signs of infection. She has started her first treatment of antibody infusions for her Bilateral lower extremity squamous cell carcinoma. She has no complaints today. 7/21; patient presents for 1 month follow-up. Unfortunately she has not had good experience with her wrap changes with home health. She would like to do her own dressing changes. She continues to do her antibody infusions. She denies signs of infection. 7/28; patient presents for 1 week follow-up. At last clinic visit she was switched to daily dressing changes due to issues with the wrap and home health placing them. Unfortunately she has developed weeping to her legs bilaterally. She would like to be placed in wraps today. She would also like to follow with [place] weekly for wrap changes instead of having home health change them. She denies signs of infection. 8/4; patient presents for 1 week follow-up. She has tolerated the Kerlix/Coban wraps well. She no longer has weeping to her legs. She took the wrap off 1 day before coming in to be able to take a shower. She has no issues or complaints today. She denies signs of infection. 8/18; patient presents for follow-up. Patient has tolerated the wraps well. She brought her Velcro compression wraps today. She has no issues or complaints today. She had her chemotherapy  infusion yesterday without issues. She denies signs of infection. 8/25; patient presents for follow-up. She used her juxta lite compressions for the past week. It is unclear if she is able to put these on correctly since she states she has a hard time getting them to look right. She reports 2 open wounds. She currently denies signs of infection. 9/1; patient presents for 1 week follow-up. She has 1 open wound. She tolerated the compression wraps well. She currently denies signs of infection. 9/8; patient presents for 1 week follow-up. She has 2 open wounds 1 on each leg. She has tolerated the compression wrap well. She currently denies signs of infection. 9/15; patient presents for 1 week follow-up. She now has 3 wounds. 2 on the left and 1 on the right. She continues to tolerate the compression wrap well. She currently denies signs of infection. She has obtained furosemide by her primary care physician and would like to discuss when to take this. Electronic Signature(s) Signed: 10/04/2020 1:23:01 PM By: Kalman Shan DO Entered By: Kalman Shan on 10/04/2020 13:14:26 -------------------------------------------------------------------------------- Physical Exam Details Patient Name: Date of Service: [name], [place]. 10/04/2020 11:15 A M Medical Record Number: [id number] Patient Account Number: [id number] Date of Birth/Sex: Treating RN: September 16, 1925 (87 y.o. [name], [name] Primary Care Provider: [name] Other Clinician: Referring Provider: Treating Provider/Extender: [name] in Treatment: 16 Constitutional respirations regular, non-labored and within target range for patient.. Cardiovascular 2+ dorsalis pedis/posterior tibialis pulses. Psychiatric pleasant and cooperative. Notes Patient has open wounds with granulation tissue present to her lower extremities bilaterally. 2 on the left and 1 on the right. No signs of infection.  Previous squamous cell carcinoma lesions continue to appear resolved. Electronic Signature(s) Signed: 10/04/2020 1:23:01 PM By: Kalman Shan DO Entered By: Kalman Shan on 10/04/2020 13:15:17 -------------------------------------------------------------------------------- Physician Orders Details Patient Name: Date of Service: [name], [place]. 10/04/2020 11:15 A M Medical Record Number: [id number] Patient Account Number: [id number] Date of Birth/Sex: Treating RN: 1925-03-22 (87 y.o. [name] Primary Care Provider: [name] Other Clinician: Referring Provider: Treating Provider/Extender: [name] in Treatment: 3 Verbal / Phone Orders: No Diagnosis Coding ICD-10 Coding Code Description C44.92 Squamous cell carcinoma of skin, unspecified L97.819 Non-pressure chronic ulcer of other part of right lower leg with unspecified severity L97.829 Non-pressure chronic ulcer of other part of left lower leg with unspecified severity I87.2 Venous insufficiency (chronic) (peripheral) Follow-up Appointments ppointment in 1 week. - with Dr. Heber [place] Return A Bathing/ Shower/ Hygiene May shower with protection but do not get wound dressing(s) wet. - Use cast protector Edema Control - Lymphedema / SCD / Other Elevate legs to the level of the heart or above for 30 minutes daily and/or when sitting, a frequency of: - elevate the legs throughout the day heart level if possible. Avoid standing for long periods of time. Exercise regularly Additional Orders / Instructions Follow Nutritious Diet Wound Treatment Wound #7R - Lower Leg Wound Laterality: Left, Anterior Cleanser: Soap and Water 1 x Per Week/15 Days Discharge Instructions: May shower and wash wound with dial antibacterial soap and water prior to dressing change. Cleanser: Wound Cleanser 1 x Per Week/15 Days Discharge Instructions: Cleanse the wound with wound cleanser prior to  applying a clean dressing using gauze sponges, not tissue or cotton balls. Peri-Wound Care: Triamcinolone 15 (g) 1 x Per Week/15 Days Discharge Instructions: In clinic only. Use triamcinolone 15 (g) in clinic only. Peri-Wound Care: Bernie Covey  Lotion (Moisturizing lotion) 1 x Per Week/15 Days Discharge Instructions: Apply moisturizing lotion as directed Prim Dressing: PolyMem Silver Non-Adhesive Dressing, 4.25x4.25 in 1 x Per Week/15 Days ary Discharge Instructions: Apply to wound bed as instructed Secondary Dressing: Woven Gauze Sponge, Non-Sterile 4x4 in 1 x Per Week/15 Days Discharge Instructions: Apply over primary dressing as directed. Secondary Dressing: ABD Pad, 8x10 1 x Per Week/15 Days Discharge Instructions: Apply over primary dressing as directed. Compression Wrap: ThreePress (3 layer compression wrap) 1 x Per Week/15 Days Discharge Instructions: Apply three layer compression as directed. Wound #8 - Lower Leg Wound Laterality: Right, Lateral Cleanser: Soap and Water 1 x Per Week/15 Days Discharge Instructions: May shower and wash wound with dial antibacterial soap and water prior to dressing change. Cleanser: Wound Cleanser 1 x Per Week/15 Days Discharge Instructions: Cleanse the wound with wound cleanser prior to applying a clean dressing using gauze sponges, not tissue or cotton balls. Peri-Wound Care: Triamcinolone 15 (g) 1 x Per Week/15 Days Discharge Instructions: In clinic only. Use triamcinolone 15 (g) in clinic only. Peri-Wound Care: Sween Lotion (Moisturizing lotion) 1 x Per Week/15 Days Discharge Instructions: Apply moisturizing lotion as directed Prim Dressing: PolyMem Silver Non-Adhesive Dressing, 4.25x4.25 in 1 x Per Week/15 Days ary Discharge Instructions: Apply to wound bed as instructed Secondary Dressing: Woven Gauze Sponge, Non-Sterile 4x4 in 1 x Per Week/15 Days Discharge Instructions: Apply over primary dressing as directed. Secondary Dressing: ABD Pad, 8x10 1 x  Per Week/15 Days Discharge Instructions: Apply over primary dressing as directed. Compression Wrap: ThreePress (3 layer compression wrap) 1 x Per Week/15 Days Discharge Instructions: Apply three layer compression as directed. Wound #9 - Lower Leg Wound Laterality: Left, Posterior Cleanser: Soap and Water 1 x Per Week/15 Days Discharge Instructions: May shower and wash wound with dial antibacterial soap and water prior to dressing change. Cleanser: Wound Cleanser 1 x Per Week/15 Days Discharge Instructions: Cleanse the wound with wound cleanser prior to applying a clean dressing using gauze sponges, not tissue or cotton balls. Peri-Wound Care: Triamcinolone 15 (g) 1 x Per Week/15 Days Discharge Instructions: In clinic only. Use triamcinolone 15 (g) in clinic only. Peri-Wound Care: Sween Lotion (Moisturizing lotion) 1 x Per Week/15 Days Discharge Instructions: Apply moisturizing lotion as directed Prim Dressing: PolyMem Silver Non-Adhesive Dressing, 4.25x4.25 in 1 x Per Week/15 Days ary Discharge Instructions: Apply to wound bed as instructed Secondary Dressing: Woven Gauze Sponge, Non-Sterile 4x4 in 1 x Per Week/15 Days Discharge Instructions: Apply over primary dressing as directed. Secondary Dressing: ABD Pad, 8x10 1 x Per Week/15 Days Discharge Instructions: Apply over primary dressing as directed. Compression Wrap: ThreePress (3 layer compression wrap) 1 x Per Week/15 Days Discharge Instructions: Apply three layer compression as directed. Electronic Signature(s) Signed: 10/04/2020 1:23:01 PM By: Kalman Shan DO Entered By: Kalman Shan on 10/04/2020 13:15:39 -------------------------------------------------------------------------------- Problem List Details Patient Name: Date of Service: [name], [place]. 10/04/2020 11:15 A M Medical Record Number: [id number] Patient Account Number: [id number] Date of Birth/Sex: Treating RN: 1925/11/19 (87 y.o. [name], [name] Primary  Care Provider: [name] Other Clinician: Referring Provider: Treating Provider/Extender: [name] in Treatment: 51 Active Problems ICD-10 Encounter Code Description Active Date MDM Diagnosis C44.92 Squamous cell carcinoma of skin, unspecified 06/11/2020 No Yes L97.819 Non-pressure chronic ulcer of other part of right lower leg with unspecified 06/11/2020 No Yes severity L97.829 Non-pressure chronic ulcer of other part of left lower leg with unspecified 06/11/2020 No Yes severity I87.2 Venous  insufficiency (chronic) (peripheral) 06/11/2020 No Yes Inactive Problems Resolved Problems Electronic Signature(s) Signed: 10/04/2020 1:23:01 PM By: Kalman Shan DO Entered By: Kalman Shan on 10/04/2020 13:13:10 -------------------------------------------------------------------------------- Progress Note Details Patient Name: Date of Service: [name], [place]. 10/04/2020 11:15 A M Medical Record Number: [id number] Patient Account Number: [id number] Date of Birth/Sex: Treating RN: 08-28-1925 (87 y.o. [name] Primary Care Provider: [name] Other Clinician: Referring Provider: Treating Provider/Extender: [name] in Treatment: 16 Subjective Chief Complaint Information obtained from Patient Bilateral lower extremity wounds that have been biopsied and positive for squamous cell carcinoma History of Present Illness (HPI) The following HPI elements were documented for the patient's wound: Location: left leg Admission 5/23 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 She states that the wound on the right has been present for a year. The 1 on the left has  opened up 1 month ago. She is followed with oncology for this issue as she had biopsies that showed squamous cell carcinoma. She is also seeing radiation oncology for treatment options. She presents today because she would like for her wounds to be healed by [place]. She currently denies signs of infection. 6/1; patient presents for 1 week follow-up. She states she has tolerated the leg wraps well. She states these do not bother her and is happy to continue with them. She is scheduled to see her oncologist today to go over treatment options for the bilateral lower extremity squamous cell carcinoma. Radiation is currently not a recommended option. Patient states she overall feels well. 6/22; patient presents for 3-week follow-up. She has tolerated the wraps well until her last wrap where she states they were uncomfortable. She attributes this to the home health nurse. She denies signs of infection. She has started her first treatment of antibody infusions for her Bilateral lower extremity squamous cell carcinoma. She has no complaints today. 7/21; patient presents for 1 month follow-up. Unfortunately she has not had good experience with her wrap changes with home health. She would like to do her own dressing changes. She continues to do her antibody infusions. She denies signs of infection. 7/28; patient presents for 1 week follow-up. At last clinic visit she was switched to daily dressing changes due to issues with the wrap and home health placing them. Unfortunately she has developed weeping to her legs bilaterally. She would like to be placed in wraps today. She would also like to follow with [place] weekly for wrap changes instead of having home health change them. She denies signs of infection. 8/4; patient presents for 1 week follow-up. She has tolerated the Kerlix/Coban wraps well. She no longer has weeping to her legs. She took the wrap off 1 day before coming in to be able to take a shower. She has no  issues or complaints today. She denies signs of infection. 8/18; patient presents for follow-up. Patient has tolerated the wraps well. She brought her Velcro compression wraps today. She has no issues or complaints today. She had her chemotherapy infusion yesterday without issues. She denies signs of infection. 8/25; patient presents for follow-up. She used her juxta lite compressions for the past week. It is unclear if she is able to put these on correctly since she states she has a hard time  getting them to look right. She reports 2 open wounds. She currently denies signs of infection. 9/1; patient presents for 1 week follow-up. She has 1 open wound. She tolerated the compression wraps well. She currently denies signs of infection. 9/8; patient presents for 1 week follow-up. She has 2 open wounds 1 on each leg. She has tolerated the compression wrap well. She currently denies signs of infection. 9/15; patient presents for 1 week follow-up. She now has 3 wounds. 2 on the left and 1 on the right. She continues to tolerate the compression wrap well. She currently denies signs of infection. She has obtained furosemide by her primary care physician and would like to discuss when to take this. Patient History Information obtained from Patient. Family History Unknown History. Social History Never smoker, Marital Status - Single, Alcohol Use - Never, Drug Use - No History, Caffeine Use - Never. Medical History Eyes Denies history of Cataracts, Optic Neuritis Ear/Nose/Mouth/Throat Denies history of Chronic sinus problems/congestion, Middle ear problems Hematologic/Lymphatic Patient has history of Anemia Denies history of Hemophilia, Human Immunodeficiency Virus, Lymphedema, Sickle Cell Disease Respiratory Denies history of Aspiration, Asthma, Chronic Obstructive Pulmonary Disease (COPD), Pneumothorax, Sleep Apnea, Tuberculosis Cardiovascular Patient has history of Arrhythmia - Atrial Flutter,  A fibb, Congestive Heart Failure, Hypertension Denies history of Angina, Coronary Artery Disease, Deep Vein Thrombosis, Hypotension, Myocardial Infarction, Peripheral Arterial Disease, Peripheral Venous Disease, Phlebitis, Vasculitis Gastrointestinal Patient has history of Colitis Denies history of Cirrhosis , Crohnoos, Hepatitis A, Hepatitis B, Hepatitis C Endocrine Denies history of Type I Diabetes, Type II Diabetes Genitourinary Denies history of End Stage Renal Disease Immunological Denies history of Lupus Erythematosus, Raynaudoos, Scleroderma Integumentary (Skin) Denies history of History of Burn Musculoskeletal Patient has history of Osteoarthritis Denies history of Gout, Rheumatoid Arthritis, Osteomyelitis Neurologic Denies history of Dementia, Neuropathy, Quadriplegia, Paraplegia, Seizure Disorder Oncologic Denies history of Received Chemotherapy, Received Radiation Hospitalization/Surgery History - removal of rod left leg. 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 Vitals Time Taken: 11:42 AM, Height: 65 in, Weight: 163 lbs, BMI: 27.1, Temperature: 98.5 F, Pulse: 74 bpm, Respiratory Rate: 16 breaths/min, Blood Pressure: 122/68 mmHg. Cardiovascular 2+ dorsalis pedis/posterior tibialis pulses. Psychiatric pleasant and cooperative. General Notes: Patient has open wounds with granulation tissue present to her lower extremities bilaterally. 2 on the left and 1 on the right. No signs of infection. Previous squamous cell carcinoma lesions continue to appear resolved. Integumentary (Hair, Skin) Wound #6R status is Healed - Epithelialized. Original cause of wound was Blister. The date acquired was: 04/20/2020. The wound has been in treatment 16 weeks. The  wound is located on the Right,Anterior Lower Leg. The wound measures 0cm length x 0cm width x 0cm depth; 0cm^2 area and 0cm^3 volume. Wound #7R status is Open. Original cause of wound was Blister. The date acquired was: 04/20/2020. The wound has been in treatment 16 weeks. The wound is located on the Left,Anterior Lower Leg. The wound measures 2.5cm length x 1.5cm width x 0.1cm depth; 2.945cm^2 area and 0.295cm^3 volume. There is Fat Layer (Subcutaneous Tissue) exposed. There is a medium amount of serosanguineous drainage noted. The wound margin is distinct with the outline attached to the wound base. There is large (67-100%) red, pink granulation within the wound bed. There is a small (1-33%) amount of necrotic tissue within the wound bed including Adherent Slough. Wound #8 status is Open.  Original cause of wound was Gradually Appeared. The date acquired was: 09/27/2020. The wound has been in treatment 1 weeks. The wound is located on the Right,Lateral Lower Leg. The wound measures 1cm length x 0.5cm width x 0.1cm depth; 0.393cm^2 area and 0.039cm^3 volume. There is Fat Layer (Subcutaneous Tissue) exposed. There is no tunneling or undermining noted. There is a medium amount of serosanguineous drainage noted. The wound margin is distinct with the outline attached to the wound base. There is large (67-100%) pink granulation within the wound bed. There is no necrotic tissue within the wound bed. Wound #9 status is Open. Original cause of wound was Gradually Appeared. The date acquired was: 10/04/2020. The wound is located on the Left,Posterior Lower Leg. The wound measures 0.6cm length x 0.4cm width x 0.1cm depth; 0.188cm^2 area and 0.019cm^3 volume. There is Fat Layer (Subcutaneous Tissue) exposed. There is no tunneling or undermining noted. There is a medium amount of serosanguineous drainage noted. The wound margin is distinct with the outline attached to the wound base. There is large (67-100%) red  granulation within the wound bed. There is no necrotic tissue within the wound bed. Assessment Active Problems ICD-10 Squamous cell carcinoma of skin, unspecified Non-pressure chronic ulcer of other part of right lower leg with unspecified severity Non-pressure chronic ulcer of other part of left lower leg with unspecified severity Venous insufficiency (chronic) (peripheral) Ms. Kaercher continues to have wounds on her lower extremity although these are limited to skin breakdown and have healthy granulation tissue present. I will switch the dressing to PolyMem silver to see if this makes any difference. She is still using 3 layer compression. She now has furosemide available as needed. I recommended she take 2 doses total, 1 on Saturday and 1 on Monday to see if this will help with her swelling. No signs of infection on exam. SCC lesions continue to appear resolved Procedures Wound #7R Pre-procedure diagnosis of Wound #7R is a Malignant Wound located on the Left,Anterior Lower Leg . There was a Three Layer Compression Therapy Procedure by Lorrin Jackson, RN. Post procedure Diagnosis Wound #7R: Same as Pre-Procedure Wound #8 Pre-procedure diagnosis of Wound #8 is a Venous Leg Ulcer located on the Right,Lateral Lower Leg . There was a Three Layer Compression Therapy Procedure by Lorrin Jackson, RN. Post procedure Diagnosis Wound #8: Same as Pre-Procedure Wound #9 Pre-procedure diagnosis of Wound #9 is a Venous Leg Ulcer located on the Left,Posterior Lower Leg . There was a Three Layer Compression Therapy Procedure by Lorrin Jackson, RN. Post procedure Diagnosis Wound #9: Same as Pre-Procedure Plan Follow-up Appointments: Return Appointment in 1 week. - with Dr. Heber [place] Bathing/ Shower/ Hygiene: May shower with protection but do not get wound dressing(s) wet. - Use cast protector Edema Control - Lymphedema / SCD / Other: Elevate legs to the level of the heart or above for 30 minutes daily  and/or when sitting, a frequency of: - elevate the legs throughout the day heart level if possible. Avoid standing for long periods of time. Exercise regularly Additional Orders / Instructions: Follow Nutritious Diet WOUND #7R: - Lower Leg Wound Laterality: Left, Anterior Cleanser: Soap and Water 1 x Per Week/15 Days Discharge Instructions: May shower and wash wound with dial antibacterial soap and water prior to dressing change. Cleanser: Wound Cleanser 1 x Per Week/15 Days Discharge Instructions: Cleanse the wound with wound cleanser prior to applying a clean dressing using gauze sponges, not tissue or cotton balls. Peri-Wound Care: Triamcinolone 15 (g) 1 x  Per Week/15 Days Discharge Instructions: In clinic only. Use triamcinolone 15 (g) in clinic only. Peri-Wound Care: Sween Lotion (Moisturizing lotion) 1 x Per Week/15 Days Discharge Instructions: Apply moisturizing lotion as directed Prim Dressing: PolyMem Silver Non-Adhesive Dressing, 4.25x4.25 in 1 x Per Week/15 Days ary Discharge Instructions: Apply to wound bed as instructed Secondary Dressing: Woven Gauze Sponge, Non-Sterile 4x4 in 1 x Per Week/15 Days Discharge Instructions: Apply over primary dressing as directed. Secondary Dressing: ABD Pad, 8x10 1 x Per Week/15 Days Discharge Instructions: Apply over primary dressing as directed. Com pression Wrap: ThreePress (3 layer compression wrap) 1 x Per Week/15 Days Discharge Instructions: Apply three layer compression as directed. WOUND #8: - Lower Leg Wound Laterality: Right, Lateral Cleanser: Soap and Water 1 x Per Week/15 Days Discharge Instructions: May shower and wash wound with dial antibacterial soap and water prior to dressing change. Cleanser: Wound Cleanser 1 x Per Week/15 Days Discharge Instructions: Cleanse the wound with wound cleanser prior to applying a clean dressing using gauze sponges, not tissue or cotton balls. Peri-Wound Care: Triamcinolone 15 (g) 1 x Per Week/15  Days Discharge Instructions: In clinic only. Use triamcinolone 15 (g) in clinic only. Peri-Wound Care: Sween Lotion (Moisturizing lotion) 1 x Per Week/15 Days Discharge Instructions: Apply moisturizing lotion as directed Prim Dressing: PolyMem Silver Non-Adhesive Dressing, 4.25x4.25 in 1 x Per Week/15 Days ary Discharge Instructions: Apply to wound bed as instructed Secondary Dressing: Woven Gauze Sponge, Non-Sterile 4x4 in 1 x Per Week/15 Days Discharge Instructions: Apply over primary dressing as directed. Secondary Dressing: ABD Pad, 8x10 1 x Per Week/15 Days Discharge Instructions: Apply over primary dressing as directed. Com pression Wrap: ThreePress (3 layer compression wrap) 1 x Per Week/15 Days Discharge Instructions: Apply three layer compression as directed. WOUND #9: - Lower Leg Wound Laterality: Left, Posterior Cleanser: Soap and Water 1 x Per Week/15 Days Discharge Instructions: May shower and wash wound with dial antibacterial soap and water prior to dressing change. Cleanser: Wound Cleanser 1 x Per Week/15 Days Discharge Instructions: Cleanse the wound with wound cleanser prior to applying a clean dressing using gauze sponges, not tissue or cotton balls. Peri-Wound Care: Triamcinolone 15 (g) 1 x Per Week/15 Days Discharge Instructions: In clinic only. Use triamcinolone 15 (g) in clinic only. Peri-Wound Care: Sween Lotion (Moisturizing lotion) 1 x Per Week/15 Days Discharge Instructions: Apply moisturizing lotion as directed Prim Dressing: PolyMem Silver Non-Adhesive Dressing, 4.25x4.25 in 1 x Per Week/15 Days ary Discharge Instructions: Apply to wound bed as instructed Secondary Dressing: Woven Gauze Sponge, Non-Sterile 4x4 in 1 x Per Week/15 Days Discharge Instructions: Apply over primary dressing as directed. Secondary Dressing: ABD Pad, 8x10 1 x Per Week/15 Days Discharge Instructions: Apply over primary dressing as directed. Com pression Wrap: ThreePress (3 layer  compression wrap) 1 x Per Week/15 Days Discharge Instructions: Apply three layer compression as directed. 1. PolyMem silver under 3 layer compression 2. Furosemide 40 mg once on 9/17 and another on 9/19 3. Follow-up in 1 week Electronic Signature(s) Signed: 10/04/2020 1:23:01 PM By: Kalman Shan DO Entered By: Kalman Shan on 10/04/2020 13:22:19 -------------------------------------------------------------------------------- HxROS Details Patient Name: Date of Service: [name], [place]. 10/04/2020 11:15 A M Medical Record Number: [id number] Patient Account Number: [id number] Date of Birth/Sex: Treating RN: November 24, 1925 (87 y.o. [name] Primary Care Provider: [name] Other Clinician: Referring Provider: Treating Provider/Extender: [name] in Treatment: 16 Information Obtained From Patient Eyes Medical History: Negative for: Cataracts; Optic Neuritis Ear/Nose/Mouth/Throat  Medical History: Negative for: Chronic sinus problems/congestion; Middle ear problems Hematologic/Lymphatic Medical History: Positive for: Anemia Negative for: Hemophilia; Human Immunodeficiency Virus; Lymphedema; Sickle Cell Disease Respiratory Medical History: Negative for: Aspiration; Asthma; Chronic Obstructive Pulmonary Disease (COPD); Pneumothorax; Sleep Apnea; Tuberculosis Cardiovascular Medical History: Positive for: Arrhythmia - Atrial Flutter, A fibb; Congestive Heart Failure; Hypertension Negative for: Angina; Coronary Artery Disease; Deep Vein Thrombosis; Hypotension; Myocardial Infarction; Peripheral Arterial Disease; Peripheral Venous Disease; Phlebitis; Vasculitis Past Medical History Notes: hyperlipidemia Gastrointestinal Medical History: Positive for: Colitis Negative for: Cirrhosis ; Crohns; Hepatitis A; Hepatitis B; Hepatitis C Endocrine Medical History: Negative for: Type I Diabetes; Type II Diabetes Past Medical  History Notes: hypothyroidism Genitourinary Medical History: Negative for: End Stage Renal Disease Immunological Medical History: Negative for: Lupus Erythematosus; Raynauds; Scleroderma Integumentary (Skin) Medical History: Negative for: History of Burn Musculoskeletal Medical History: Positive for: Osteoarthritis Negative for: Gout; Rheumatoid Arthritis; Osteomyelitis Neurologic Medical History: Negative for: Dementia; Neuropathy; Quadriplegia; Paraplegia; Seizure Disorder Past Medical History Notes: lumbar spindylolysis Oncologic Medical History: Negative for: Received Chemotherapy; Received Radiation Past Medical History Notes: BLE squamous ceel carcionoma Immunizations Pneumococcal Vaccine: Received Pneumococcal Vaccination: Yes Received Pneumococcal Vaccination On or After 60th Birthday: No Implantable Devices None Hospitalization / Surgery History Type of Hospitalization/Surgery removal of rod left leg Family and Social History Unknown History: Yes; Never smoker; Marital Status - Single; Alcohol Use: Never; Drug Use: No History; Caffeine Use: Never; Financial Concerns: No; Food, Clothing or Shelter Needs: No; Support System Lacking: No; Transportation Concerns: No Electronic Signature(s) Signed: 10/04/2020 1:23:01 PM By: Kalman Shan DO Signed: 10/04/2020 4:34:42 PM By: Rhae Hammock RN Entered By: Kalman Shan on 10/04/2020 13:14:37 -------------------------------------------------------------------------------- SuperBill Details Patient Name: Date of Service: [name], [place]. 10/04/2020 Medical Record Number: [id number] Patient Account Number: [id number] Date of Birth/Sex: Treating RN: 1926/01/12 (87 y.o. 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 ICD-10 Diagnosis Description L97.819 Non-pressure chronic ulcer of other part of right lower leg with unspecified severity L97.829  Non-pressure chronic ulcer of other part of left lower leg with unspecified severity Modifier: Quantity: 1 Physician Procedures : CPT4 Code Description Modifier [id number] - WC PHYS LEVEL 3 - EST PT ICD-10 Diagnosis Description L97.819 Non-pressure chronic ulcer of other part of right lower leg with unspecified severity L97.829 Non-pressure chronic ulcer of other part of left  lower leg with unspecified severity C44.92 Squamous cell carcinoma of skin, unspecified I87.2 Venous insufficiency (chronic) (peripheral) Quantity: 1 Electronic Signature(s) Signed: 10/04/2020 3:24:44 PM By: Kalman Shan DO Entered By: Kalman Shan on 10/04/2020 13:22:29

## 2020-10-04 NOTE — Telephone Encounter (Signed)
Ok Furosemide - Rx emailed Thx

## 2020-10-05 NOTE — Telephone Encounter (Signed)
Called pt again still no answer x's 10 rings.. closing encounter.Marland KitchenJohny Bell

## 2020-10-08 NOTE — Progress Notes (Signed)
[name] ([id number]) , Visit Report for 10/04/2020 Arrival Information Details Patient Name: Date of Service: [name], [place]. 10/04/2020 11:15 A M Medical Record Bell: [id number] Patient Account Bell: [id number] Date of Bell: Treating RN: 1925-09-08 (87 y.o. [name], [name]: [name] [name]: [name] Carnelius Bell/Extender: [name] Visit Information History Since Last Visit Added or deleted any medications: No Patient Arrived: [name] Any new allergies or adverse reactions: No Arrival Time: 11:41 Had a fall or experienced change in No Accompanied By: self activities of daily living that may affect Transfer Assistance: None risk of falls: Patient Identification Verified: Yes Signs or symptoms of abuse/neglect since last visito No Secondary Verification Process Completed: Yes Hospitalized since last visit: No Patient Requires Transmission-Based No Implantable device outside of the clinic excluding No Precautions: cellular tissue based products placed in the center Patient Has Alerts: Yes since last visit: Patient Alerts: R ABI = Non compressible Has Dressing in Place as Prescribed: Yes L ABI = Non compressible Pain Present Now: No Electronic Signature(s) Signed: 10/08/2020 10:46:33 AM By: Sandre Kitty Entered By: Sandre Kitty on 10/04/2020 11:42:04 -------------------------------------------------------------------------------- Compression Therapy Details Patient Name: Date of Service: [name], Blessin A. 10/04/2020 11:15 A M Medical Record Bell: [id number] Patient Account Bell: [id number] Date of Bell: Treating RN: [birth year] (87 y.o. [name] Primary Care Johnell Landowski: [name] [name]: [name] in Bell:  Bell Compression Therapy Performed for Wound Assessment: Wound #7R Left,Anterior Lower Leg Performed By: Clinician Lorrin Jackson, RN Compression Type: Three Layer Post Procedure Diagnosis Same as Pre-procedure Electronic Signature(s) Signed: 10/04/2020 5:57:57 PM By: Lorrin Jackson Entered By: Lorrin Jackson on 10/04/2020 12:17:Bell -------------------------------------------------------------------------------- Compression Therapy Details Patient Name: Date of Service: [name], [name]: [id number] Patient Account Bell: [id number] Date of Bell: Treating RN: 06-11-1925 (87 y.o. [name] Primary Care Jenasia Dolinar: [name] [name] Clinician: Referring Altie Savard: [name] [name]/Extender: [name] Compression Therapy Performed for Wound Assessment: Wound #8 Bell,Lateral Lower Leg Performed By: Clinician Lorrin Jackson, RN Compression Type: Three Layer Post Procedure Diagnosis Same as Pre-procedure Electronic Signature(s) Signed: 10/04/2020 5:57:57 PM By: Lorrin Jackson Entered By: Lorrin Jackson on 10/04/2020 12:17:Bell -------------------------------------------------------------------------------- Compression Therapy Details Patient Name: Date of Service: [name], [name]: Treating RN: 1927-01-Bell (87 y.o. [name] Primary Care Bracken Moffa: [name] [name] Clinician: Referring [name]: [name] [name]/Extender: [name] Compression Therapy Performed for Wound Assessment: Wound #9 Left,Posterior Lower Leg Performed By: Clinician Lorrin Jackson, RN Compression Type: Three Layer Post Procedure Diagnosis Same as Pre-procedure Electronic Signature(s) Signed: 10/04/2020 5:57:57 PM By:  Lorrin Jackson Entered By: Lorrin Jackson on 10/04/2020 12:17:Bell -------------------------------------------------------------------------------- Encounter Discharge Information Details Patient Name: Date of Service: [name], [place]. 10/04/2020 11:15 A M Medical Record Bell: [id number] Patient Account Bell: [id number] Date of Bell: Treating RN: 18-Jan-1926 (87 y.o. [name] Primary Care Lakin Romer: [name] [name] Clinician: Referring [name]: [name] [name]/Extender: [name] Encounter Discharge Information Items Discharge Condition: Stable Ambulatory Status: [name] Discharge Destination: Home Transportation: Private Auto Schedule Follow-up Appointment: Yes Clinical Summary of Care: Provided on 10/04/2020 Form Type Recipient Paper Patient Patient Electronic Signature(s) Signed:  10/04/2020 5:57:57 PM By: Lorrin Jackson Entered By: Lorrin Jackson on 10/04/2020 15:39:28 -------------------------------------------------------------------------------- Lower Extremity Assessment Details Patient Name: Date of Service: [name], [name] [place]. 10/04/2020 11:15 A M Medical Record Bell: [id number] Patient Account Bell: [id number] Date of Bell: Treating RN: 11/25/1925 (87 y.o. [name] Primary Care Daveigh Batty: [name] [name] Clinician: Referring Kaely Hollan: [name] [name]/Extender: [name] Edema Assessment Assessed: [Left: Yes] [name]: Yes] Edema: [Left: Yes] [Bell: Yes] Calf Left: Bell: Point of Measurement: 30 cm From Medial Instep 32.8 cm 33 cm Ankle Left: Bell: Point of Measurement: 9 cm From Medial Instep 22 cm 22 cm Vascular Assessment Pulses: Dorsalis Pedis Palpable: [Left:Yes] [Bell:Yes] Electronic Signature(s) Signed: 10/04/2020 5:57:57 PM By: Lorrin Jackson Entered By: Lorrin Jackson on 10/04/2020  12:09:49 -------------------------------------------------------------------------------- Multi Wound Chart Details Patient Name: Date of Service: [name], [place]. 10/04/2020 11:15 A M Medical Record Bell: [id number] Patient Account Bell: [id number] Date of Bell: Treating RN: [birth year] (87 y.o. [name], [name] Primary Care Ammarie Matsuura: [name] [name] Clinician: Referring [name]: [name] Jazmyne Beauchesne/Extender: [name] Vital Signs Height(in): 11 Pulse(bpm): 51 Weight(lbs): 163 Blood Pressure(mmHg): 122/68 Body Mass Index(BMI): 27 Temperature(F): 98.5 Respiratory Rate(breaths/min): Bell Photos: [6R:Bell, Anterior Lower Leg] [7R:Left, Anterior Lower Leg] [8:Bell, Lateral Lower Leg] Wound Location: [6R:Blister] [7R:Blister] [8:Gradually Appeared] Wounding Event: [6R:Malignant Wound] [7R:Malignant Wound] [8:Venous Leg Ulcer] Primary Etiology: [6R:Anemia, Arrhythmia, Congestive Heart Anemia, Arrhythmia, Congestive Heart Anemia, Arrhythmia, Congestive Heart] Comorbid History: [6R:Failure, Hypertension, Colitis, Osteoarthritis 04/20/2020] [7R:Failure, Hypertension, Colitis, Osteoarthritis 04/20/2020] [8:Failure, Hypertension, Colitis, Osteoarthritis 09/27/2020] Date Acquired: [6R:Bell] [7R:Bell] [8:1] Weeks of Bell: [6R:Healed - Epithelialized] [7R:Open] [8:Open] Wound Status: [6R:Yes] [7R:Yes] [8:No] Wound Recurrence: [6R:0x0x0] [7R:2.5x1.5x0.1] [8:1x0.5x0.1] Measurements L x W x D (cm) [6R:0] [7R:2.945] [8:0.393] A (cm) : rea [6R:0] [8A:4.166] [8:0.039] Volume (cm) : [6R:100.00%] [7R:4.10%] [8:Bell.60%] % Reduction in Area: [6R:100.00%] [7R:3.90%] [8:17.00%] % Reduction in Volume: [6R:Full Thickness Without Exposed] [7R:Full Thickness Without Exposed] [8:Full Thickness Without Exposed] Classification: [6R:Support Structures N/A] [7R:Support Structures Medium] [8:Support Structures Medium] Exudate A mount: [6R:N/A]  [7R:Serosanguineous] [8:Serosanguineous] Exudate Type: [6R:N/A] [7R:red, brown] [8:red, brown] Exudate Color: [6R:N/A] [7R:Distinct, outline attached] [8:Distinct, outline attached] Wound Margin: [6R:N/A] [7R:Large (67-100%)] [8:Large (67-100%)] Granulation A mount: [6R:N/A] [7R:Red, Pink] [8:Pink] Granulation Quality: [6R:N/A] [7R:Small (1-33%)] [8:None Present (0%)] Necrotic A mount: [6R:N/A] [7R:None] [8:Small (1-33%)] Epithelialization: [6R:N/A] [7R:Compression Therapy] [8:Compression Humphrey Wound Bell: 9 N/A N/A Photos: No Photos N/A N/A Left, Posterior Lower Leg N/A N/A Wound Location: Gradually Appeared N/A N/A Wounding Event: Venous Leg Ulcer N/A N/A Primary Etiology: Anemia, Arrhythmia, Congestive Heart N/A N/A Comorbid History: Failure, Hypertension, Colitis, Osteoarthritis 10/04/2020 N/A N/A Date Acquired: 0 N/A N/A Weeks of Bell: Open N/A N/A Wound Status: No N/A N/A Wound Recurrence: 0.6x0.4x0.1 N/A N/A Measurements L x W x D (cm) 0.188 N/A N/A A (cm) : rea 0.019 N/A N/A Volume (cm) : N/A N/A N/A % Reduction in Area: N/A N/A N/A % Reduction in Volume: Full Thickness Without Exposed N/A N/A Classification: Support Structures Medium N/A N/A Exudate Amount: Serosanguineous N/A N/A Exudate Type: red, brown N/A N/A Exudate Color: Distinct, outline attached N/A N/A Wound Margin: Large (67-100%) N/A N/A Granulation Amount: Red N/A N/A Granulation Quality: None Present (0%) N/A N/A Necrotic Amount: Fat Layer (Subcutaneous Tissue): Yes N/A N/A Exposed Structures: Fascia: No Tendon: No Muscle: No Joint: No Bone: No None N/A N/A Epithelialization: Compression Therapy N/A N/A Procedures Performed: Bell Notes Electronic Signature(s) Signed: 10/04/2020 1:23:01 PM  By: Kalman Shan DO Signed: 10/04/2020 4:34:42 PM By: Rhae Hammock RN Entered By: Kalman Shan on 10/04/2020  13:13:19 -------------------------------------------------------------------------------- Multi-Disciplinary Care Plan Details Patient Name: Date of Service: [name], [place]. 10/04/2020 11:15 A M Medical Record Bell: [id number] Patient Account Bell: [id number] Date of Bell: Treating RN: [birth year]Bell[birth year] (87 y.o. [name] Primary Care Jaxyn Rout: [name] [name] Clinician: Referring Meshulem Onorato: [name] Siani Utke/Extender: [name]: Estelle reviewed with physician Active Inactive Malignancy/Atypical Etiology Nursing Diagnoses: Knowledge deficit related to disease process and management of malignancy Goals: Patient/caregiver will verbalize understanding of disease process and disease management of atypical ulcer etiology Date Initiated: 06/11/2020 Date Inactivated: 07/11/2020 Target Resolution Date: 07/06/2020 Goal Status: Met Patient/caregiver will verbalize understanding of disease process and disease management of malignancy Date Initiated: 06/11/2020 Target Resolution Date: 10/11/2020 Goal Status: Active Interventions: Assess patient and family medical history for signs and symptoms of malignancy/atypical etiology upon admission Provide education on atypical ulcer etiologies Provide education on malignant ulcerations Notes: Wound/Skin Impairment Nursing Diagnoses: Impaired tissue integrity Knowledge deficit related to ulceration/compromised skin integrity Goals: Patient/caregiver will verbalize understanding of skin care regimen Date Initiated: 06/11/2020 Target Resolution Date: 10/11/2020 Goal Status: Active Interventions: Assess patient/caregiver ability to obtain necessary supplies Assess patient/caregiver ability to perform ulcer/skin care regimen upon admission and as needed Assess ulceration(s) every visit Provide education on ulcer and skin care Notes: Electronic  Signature(s) Signed: 10/04/2020 5:57:57 PM By: Lorrin Jackson Entered By: Lorrin Jackson on 10/04/2020 12:18:33 -------------------------------------------------------------------------------- Pain Assessment Details Patient Name: Date of Service: [name], [place]. 10/04/2020 11:15 A M Medical Record Bell: [id number] Patient Account Bell: [id number] Date of Bell: Treating RN: December 05, 1925 (87 y.o. [name], [name] Primary Care Dajia Gunnels: [name] Clinician: [name] Referring Toluwani Ruder: [name] Bronwyn Belasco/Extender: [name] Active Problems Location of Pain Severity and Description of Pain Patient Has Paino No Site Locations Pain Management and Medication Current Pain Management: Electronic Signature(s) Signed: 10/04/2020 4:34:42 PM By: Rhae Hammock RN Signed: 10/08/2020 10:46:33 AM By: Sandre Kitty Entered By: Sandre Kitty on 10/04/2020 11:42:32 -------------------------------------------------------------------------------- Patient/Caregiver Education Details Patient Name: Date of Service: [name], [place]. 9/15/2022andnbsp11:15 [place] Record Bell: [id number] Patient Account Bell: [id number] Date of Birth/Gender: Treating RN: January 21, 1926 (87 y.o. [name] Primary Care Physician: [name] [name] Clinician: Referring Physician: Treating Physician/Extender: [name] Education Assessment Education Provided To: Patient Education Topics Provided Venous: Methods: Explain/Verbal, Printed Responses: State content correctly Wound/Skin Impairment: Methods: Explain/Verbal, Printed Responses: State content correctly Electronic Signature(s) Signed: 10/04/2020 5:57:57 PM By: Lorrin Jackson Entered By: Lorrin Jackson on 10/04/2020  12:18:58 -------------------------------------------------------------------------------- Wound Assessment Details Patient Name: Date of Service: [name], [place]. 10/04/2020 11:15 A M Medical Record Bell: [id number] Patient Account Bell: [id number] Date of Bell: Treating RN: [birth year] (87 y.o. [name], [name] Primary Care Raydon Chappuis: [name] [name] Clinician: Referring Ameerah Huffstetler: [name] Katerina Zurn/Extender: [name] Wound Status Wound Bell: 6R Primary Malignant Wound Etiology: Wound Location: Bell, Anterior Lower Leg Wound Healed - Epithelialized Wounding Event: Blister Status: Date Acquired: 04/20/2020 Comorbid Anemia, Arrhythmia, Congestive Heart Failure, Hypertension, Weeks Of Bell: Bell History: Colitis, Osteoarthritis Clustered Wound: No Photos Wound Measurements Length: (cm) Width: (cm) Depth: (cm) Area: (cm) Volume: (cm) 0 % Reduction in Area: 100% 0 % Reduction in Volume: 100% 0 0 0 Wound Description Classification: Full Thickness Without Exposed Support  Structur es Engineer, maintenance) Signed: 10/04/2020 4:34:42 PM By: Rhae Hammock RN Signed: 10/04/2020 5:57:57 PM By: Lorrin Jackson Entered By: Lorrin Jackson on 10/04/2020 12:10:18 -------------------------------------------------------------------------------- Wound Assessment Details Patient Name: Date of Service: [name], [place]. 10/04/2020 11:15 A M Medical Record Bell: [id number] Patient Account Bell: [id number] Date of Bell: Treating RN: 1925/12/30 (87 y.o. [name], [name] Primary Care Marley Pakula: [name] [name] Clinician: Referring Fabiha Rougeau: [name] Deeandra Jerry/Extender: [name] Wound Status Wound Bell: 7R Primary Malignant Wound Etiology: Etiology: Wound Location: Left, Anterior Lower Leg Wound Open Wounding Event:  Blister Status: Date Acquired: 04/20/2020 Comorbid Anemia, Arrhythmia, Congestive Heart Failure, Hypertension, Weeks Of Bell: Bell History: Colitis, Osteoarthritis Clustered Wound: No Photos Wound Measurements Length: (cm) 2.5 Width: (cm) 1.5 Depth: (cm) 0.1 Area: (cm) 2.945 Volume: (cm) 0.295 % Reduction in Area: 4.1% % Reduction in Volume: 3.9% Epithelialization: None Wound Description Classification: Full Thickness Without Exposed Support Structures Wound Margin: Distinct, outline attached Exudate Amount: Medium Exudate Type: Serosanguineous Exudate Color: red, brown Foul Odor After Cleansing: No Slough/Fibrino Yes Wound Bed Granulation Amount: Large (67-100%) Exposed Structure Granulation Quality: Red, Pink Fascia Exposed: No Necrotic Amount: Small (1-33%) Fat Layer (Subcutaneous Tissue) Exposed: Yes Necrotic Quality: Adherent Slough Tendon Exposed: No Muscle Exposed: No Joint Exposed: No Bone Exposed: No Bell Notes Wound #7R (Lower Leg) Wound Laterality: Left, Anterior Cleanser Soap and Water Discharge Instruction: May shower and wash wound with dial antibacterial soap and water prior to dressing change. Wound Cleanser Discharge Instruction: Cleanse the wound with wound cleanser prior to applying a clean dressing using gauze sponges, not tissue or cotton balls. Peri-Wound Care Triamcinolone 15 (g) Discharge Instruction: In clinic only. Use triamcinolone 15 (g) in clinic only. Sween Lotion (Moisturizing lotion) Discharge Instruction: Apply moisturizing lotion as directed Topical Primary Dressing PolyMem Silver Non-Adhesive Dressing, 4.25x4.25 in Discharge Instruction: Apply to wound bed as instructed Secondary Dressing Woven Gauze Sponge, Non-Sterile 4x4 in Discharge Instruction: Apply over primary dressing as directed. ABD Pad, 8x10 Discharge Instruction: Apply over primary dressing as directed. Secured With Compression Wrap ThreePress (3 layer  compression wrap) Discharge Instruction: Apply three layer compression as directed. Compression Stockings Add-Ons Electronic Signature(s) Signed: 10/04/2020 4:34:42 PM By: Rhae Hammock RN Signed: 10/08/2020 10:46:33 AM By: Sandre Kitty Entered By: Sandre Kitty on 10/04/2020 11:52:29 -------------------------------------------------------------------------------- Wound Assessment Details Patient Name: Date of Service: [name], [place]. 10/04/2020 11:15 A M Medical Record Bell: [id number] Patient Account Bell: [id number] Date of Bell: Treating RN: [birth year] (87 y.o. [name], [name] Primary Care Hridhaan Yohn: [name] [name] Clinician: Referring Silena Wyss: [name] Prosperity Darrough/Extender: [name] Wound Status Wound Bell: 8 Primary Venous Leg Ulcer Etiology: Wound Location: Bell, Lateral Lower Leg Wound Open Wounding Event: Gradually Appeared Status: Date Acquired: 09/27/2020 Comorbid Anemia, Arrhythmia, Congestive Heart Failure, Hypertension, Weeks Of Bell: 1 History: Colitis, Osteoarthritis Clustered Wound: No Photos Wound Measurements Length: (cm) 1 Width: (cm) 0.5 Depth: (cm) 0.1 Area: (cm) 0.393 Volume: (cm) 0.039 % Reduction in Area: Bell.6% % Reduction in Volume: 17% Epithelialization: Small (1-33%) Tunneling: No Undermining: No Wound Description Classification: Full Thickness Without Exposed Support Structures Wound Margin: Distinct, outline attached Exudate Amount: Medium Exudate Type: Serosanguineous Exudate Color: red, brown Foul Odor After Cleansing: No Slough/Fibrino No Wound Bed Granulation Amount: Large (67-100%) Exposed Structure Granulation Quality: Pink Fascia Exposed: No Necrotic Amount: None Present (0%) Fat Layer (Subcutaneous Tissue) Exposed: Yes Tendon Exposed: No Muscle Exposed: No Joint Exposed: No  Bone Exposed: No Bell Notes Wound #8 (Lower  Leg) Wound Laterality: Bell, Lateral Cleanser Soap and Water Discharge Instruction: May shower and wash wound with dial antibacterial soap and water prior to dressing change. Wound Cleanser Discharge Instruction: Cleanse the wound with wound cleanser prior to applying a clean dressing using gauze sponges, not tissue or cotton balls. Peri-Wound Care Triamcinolone 15 (g) Discharge Instruction: In clinic only. Use triamcinolone 15 (g) in clinic only. Sween Lotion (Moisturizing lotion) Discharge Instruction: Apply moisturizing lotion as directed Topical Primary Dressing PolyMem Silver Non-Adhesive Dressing, 4.25x4.25 in Discharge Instruction: Apply to wound bed as instructed Secondary Dressing Woven Gauze Sponge, Non-Sterile 4x4 in Discharge Instruction: Apply over primary dressing as directed. ABD Pad, 8x10 Discharge Instruction: Apply over primary dressing as directed. Secured With Compression Wrap ThreePress (3 layer compression wrap) Discharge Instruction: Apply three layer compression as directed. Compression Stockings Add-Ons Electronic Signature(s) Signed: 10/04/2020 4:34:42 PM By: Rhae Hammock RN Signed: 10/04/2020 5:57:57 PM By: Lorrin Jackson Entered By: Lorrin Jackson on 10/04/2020 12:14:45 -------------------------------------------------------------------------------- Wound Assessment Details Patient Name: Date of Service: [name], [place]. 10/04/2020 11:15 A M Medical Record Bell: [id number] Patient Account Bell: [id number] Date of Bell: Treating RN: 06-26-1925 (87 y.o. [name] Primary Care Haston Casebolt: [name] [name] Clinician: Referring Aleshka Corney: [name] Teondre Jarosz/Extender: [name] Wound Status Wound Bell: 9 Primary Venous Leg Ulcer Etiology: Wound Location: Left, Posterior Lower Leg Wound Open Wounding Event: Gradually Appeared Status: Date Acquired: 10/04/2020 Comorbid  Anemia, Arrhythmia, Congestive Heart Failure, Hypertension, Weeks Of Bell: 0 History: Colitis, Osteoarthritis Clustered Wound: No Wound Measurements Length: (cm) 0.6 Width: (cm) 0.4 Depth: (cm) 0.1 Area: (cm) 0.188 Volume: (cm) 0.019 % Reduction in Area: % Reduction in Volume: Epithelialization: None Tunneling: No Undermining: No Wound Description Classification: Full Thickness Without Exposed Support Structures Wound Margin: Distinct, outline attached Exudate Amount: Medium Exudate Type: Serosanguineous Exudate Color: red, brown Foul Odor After Cleansing: No Slough/Fibrino No Wound Bed Granulation Amount: Large (67-100%) Exposed Structure Granulation Quality: Red Fascia Exposed: No Necrotic Amount: None Present (0%) Fat Layer (Subcutaneous Tissue) Exposed: Yes Tendon Exposed: No Muscle Exposed: No Joint Exposed: No Bone Exposed: No Bell Notes Wound #9 (Lower Leg) Wound Laterality: Left, Posterior Cleanser Soap and Water Discharge Instruction: May shower and wash wound with dial antibacterial soap and water prior to dressing change. Wound Cleanser Discharge Instruction: Cleanse the wound with wound cleanser prior to applying a clean dressing using gauze sponges, not tissue or cotton balls. Peri-Wound Care Triamcinolone 15 (g) Discharge Instruction: In clinic only. Use triamcinolone 15 (g) in clinic only. Sween Lotion (Moisturizing lotion) Discharge Instruction: Apply moisturizing lotion as directed Topical Primary Dressing PolyMem Silver Non-Adhesive Dressing, 4.25x4.25 in Discharge Instruction: Apply to wound bed as instructed Secondary Dressing Woven Gauze Sponge, Non-Sterile 4x4 in Discharge Instruction: Apply over primary dressing as directed. ABD Pad, 8x10 Discharge Instruction: Apply over primary dressing as directed. Secured With Compression Wrap ThreePress (3 layer compression wrap) Discharge Instruction: Apply three layer compression as  directed. Compression Stockings Add-Ons Electronic Signature(s) Signed: 10/04/2020 5:57:57 PM By: Lorrin Jackson Entered By: Lorrin Jackson on 10/04/2020 12:12:57 -------------------------------------------------------------------------------- Vitals Details Patient Name: Date of Service: [name], [place]. 10/04/2020 11:15 A M Medical Record Bell: [id number] Patient Account Bell: [id number] Date of Bell: Treating RN: [birth year] (87 y.o. [name] Primary Care Bronwyn Belasco: [name] [name] Clinician: Referring Seaborn Nakama: [name] Raffi Milstein/Extender: [name] Vital Signs Time Taken: 11:42  Temperature (F): 98.5 Height (in): 65 Pulse (bpm): 74 Weight (lbs): 163 Respiratory Rate (breaths/min): Bell Body Mass Index (BMI): 27.1 Blood Pressure (mmHg): 122/68 Reference Range: 80 - 120 mg / dl Electronic Signature(s) Signed: 10/08/2020 10:46:33 AM By: Sandre Kitty Entered By: Sandre Kitty on 10/04/2020 11:42:21

## 2020-10-10 ENCOUNTER — Other Ambulatory Visit: Payer: Medicare Other

## 2020-10-10 ENCOUNTER — Ambulatory Visit: Payer: Medicare Other | Admitting: Oncology

## 2020-10-11 ENCOUNTER — Encounter (HOSPITAL_BASED_OUTPATIENT_CLINIC_OR_DEPARTMENT_OTHER): Payer: Medicare Other | Admitting: Internal Medicine

## 2020-10-11 ENCOUNTER — Other Ambulatory Visit: Payer: Self-pay | Admitting: Internal Medicine

## 2020-10-11 ENCOUNTER — Other Ambulatory Visit: Payer: Self-pay

## 2020-10-11 DIAGNOSIS — Z85828 Personal history of other malignant neoplasm of skin: Secondary | ICD-10-CM | POA: Diagnosis not present

## 2020-10-11 DIAGNOSIS — L97829 Non-pressure chronic ulcer of other part of left lower leg with unspecified severity: Secondary | ICD-10-CM | POA: Diagnosis not present

## 2020-10-11 DIAGNOSIS — L97819 Non-pressure chronic ulcer of other part of right lower leg with unspecified severity: Secondary | ICD-10-CM

## 2020-10-11 DIAGNOSIS — C4492 Squamous cell carcinoma of skin, unspecified: Secondary | ICD-10-CM

## 2020-10-11 DIAGNOSIS — I872 Venous insufficiency (chronic) (peripheral): Secondary | ICD-10-CM

## 2020-10-11 DIAGNOSIS — Z853 Personal history of malignant neoplasm of breast: Secondary | ICD-10-CM | POA: Diagnosis not present

## 2020-10-11 NOTE — Progress Notes (Signed)
[name] ([id number]) , Visit Report for 10/11/2020 Arrival Information Details Patient Name: Date of Service: [name], [place]. 10/11/2020 11:15 A M Medical Record Number: [id number] Patient Account Number: [id number] Date of Birth/Sex: Treating RN: December 05, 1925 (87 y.o. [name] Primary Care Kutler Vanvranken: [name] Other Clinician: Referring Loretta Kluender: Treating Hanan Mcwilliams/Extender: [name] in Treatment: 35 Visit Information History Since Last Visit Added or deleted any medications: No Patient Arrived: [place] Any new allergies or adverse reactions: No Arrival Time: 11:23 Had a fall or experienced change in No Transfer Assistance: None activities of daily living that may affect Patient Identification Verified: Yes risk of falls: Secondary Verification Process Completed: Yes Signs or symptoms of abuse/neglect since last visito No Patient Requires Transmission-Based No Hospitalized since last visit: No Precautions: Implantable device outside of the clinic excluding No Patient Has Alerts: Yes cellular tissue based products placed in the center Patient Alerts: R ABI = Non compressible since last visit: L ABI = Non compressible Has Dressing in Place as Prescribed: Yes Has Compression in Place as Prescribed: Yes Pain Present Now: No Electronic Signature(s) Signed: 10/11/2020 4:57:50 PM By: Lorrin Jackson Entered By: Lorrin Jackson on 10/11/2020 11:26:42 -------------------------------------------------------------------------------- Clinic Level of Care Assessment Details Patient Name: Date of Service: [name], [name] 10/11/2020 11:15 A M Medical Record Number: [id number] Patient Account Number: [id number] Date of Birth/Sex: Treating RN: 06/23/1925 (87 y.o. [name] Primary Care Clerance Umland: [name] Other Clinician: Referring Sharday Michl: Treating Janiesha Diehl/Extender: [name]  in Treatment: 17 Clinic Level of Care Assessment Items TOOL 4 Quantity Score X- 1 0 Use when only an EandM is performed on FOLLOW-UP visit ASSESSMENTS - Nursing Assessment / Reassessment X- 1 10 Reassessment of Co-morbidities (includes updates in patient status) X- 1 5 Reassessment of Adherence to Treatment Plan ASSESSMENTS - Wound and Skin A ssessment / Reassessment []  - 0 Simple Wound Assessment / Reassessment - one wound X- 4 5 Complex Wound Assessment / Reassessment - multiple wounds []  - 0 Dermatologic / Skin Assessment (not related to wound area) ASSESSMENTS - Focused Assessment []  - 0 Circumferential Edema Measurements - multi extremities []  - 0 Nutritional Assessment / Counseling / Intervention []  - 0 Lower Extremity Assessment (monofilament, tuning fork, pulses) []  - 0 Peripheral Arterial Disease Assessment (using hand held doppler) ASSESSMENTS - Ostomy and/or Continence Assessment and Care []  - 0 Incontinence Assessment and Management []  - 0 Ostomy Care Assessment and Management (repouching, etc.) PROCESS - Coordination of Care []  - 0 Simple Patient / Family Education for ongoing care X- 1 20 Complex (extensive) Patient / Family Education for ongoing care X- 1 10 Staff obtains Programmer, systems, Records, T Results / Process Orders est []  - 0 Staff telephones HHA, Nursing Homes / Clarify orders / etc []  - 0 Routine Transfer to another Facility (non-emergent condition) []  - 0 Routine Hospital Admission (non-emergent condition) []  - 0 New Admissions / Biomedical engineer / Ordering NPWT Apligraf, etc. , []  - 0 Emergency Hospital Admission (emergent condition) []  - 0 Simple Discharge Coordination []  - 0 Complex (extensive) Discharge Coordination PROCESS - Special Needs []  - 0 Pediatric / Minor Patient Management []  - 0 Isolation Patient Management []  - 0 Hearing / Language / Visual special needs []  - 0 Assessment of Community assistance (transportation,  D/C planning, etc.) []  - 0 Additional assistance / Altered mentation []  - 0 Support Surface(s) Assessment (bed, cushion, seat, etc.) INTERVENTIONS - Wound Cleansing / Measurement []  -  0 Simple Wound Cleansing - one wound X- 4 5 Complex Wound Cleansing - multiple wounds X- 1 5 Wound Imaging (photographs - any number of wounds) []  - 0 Wound Tracing (instead of photographs) []  - 0 Simple Wound Measurement - one wound X- 4 5 Complex Wound Measurement - multiple wounds INTERVENTIONS - Wound Dressings []  - 0 Small Wound Dressing one or multiple wounds []  - 0 Medium Wound Dressing one or multiple wounds X- 2 20 Large Wound Dressing one or multiple wounds []  - 0 Application of Medications - topical []  - 0 Application of Medications - injection INTERVENTIONS - Miscellaneous []  - 0 External ear exam []  - 0 Specimen Collection (cultures, biopsies, blood, body fluids, etc.) []  - 0 Specimen(s) / Culture(s) sent or taken to Lab for analysis []  - 0 Patient Transfer (multiple staff / Civil Service fast streamer / Similar devices) []  - 0 Simple Staple / Suture removal (25 or less) []  - 0 Complex Staple / Suture removal (26 or more) []  - 0 Hypo / Hyperglycemic Management (close monitor of Blood Glucose) []  - 0 Ankle / Brachial Index (ABI) - do not check if billed separately X- 1 5 Vital Signs Has the patient been seen at the hospital within the last three years: Yes Total Score: 155 Level Of Care: New/Established - Level 4 Electronic Signature(s) Signed: 10/11/2020 4:57:50 PM By: Lorrin Jackson Entered By: Lorrin Jackson on 10/11/2020 16:14:17 -------------------------------------------------------------------------------- Lower Extremity Assessment Details Patient Name: Date of Service: [name], [name] 10/11/2020 11:15 A M Medical Record Number: [id number] Patient Account Number: [id number] Date of Birth/Sex: Treating RN: 19-May-1925 (87 y.o. [name] Primary Care Quincie Haroon:  [name] Other Clinician: Referring Shelanda Duvall: Treating Shayna Eblen/Extender: [name] in Treatment: 17 Edema Assessment Assessed: Shirlyn Goltz: Yes] Patrice Paradise: Yes] Edema: [Left: Yes] [Right: Yes] Calf Left: Right: Point of Measurement: 30 cm From Medial Instep 32.4 cm 33.2 cm Ankle Left: Right: Point of Measurement: 9 cm From Medial Instep 22 cm 22 cm Vascular Assessment Pulses: Dorsalis Pedis Palpable: [Left:Yes] [Right:Yes] Electronic Signature(s) Signed: 10/11/2020 4:57:50 PM By: Lorrin Jackson Entered By: Lorrin Jackson on 10/11/2020 11:53:40 -------------------------------------------------------------------------------- Multi Wound Chart Details Patient Name: Date of Service: [name], [name] 10/11/2020 11:15 A M Medical Record Number: [id number] Patient Account Number: [id number] Date of Birth/Sex: Treating RN: [birth year] (87 y.o. [name] Primary Care Kismet Facemire: [name] Other Clinician: Referring Ovida Delagarza: Treating Macaria Bias/Extender: [name] in Treatment: 17 Vital Signs Height(in): 65 Pulse(bpm): 63 Weight(lbs): 163 Blood Pressure(mmHg): 129/72 Body Mass Index(BMI): 27 Temperature(F): 98.6 Respiratory Rate(breaths/min): 18 Photos: Left, Medial Lower Leg Left, Anterior Lower Leg Right, Lateral Lower Leg Wound Location: Gradually Appeared Blister Gradually Appeared Wounding Event: Venous Leg Ulcer Malignant Wound Venous Leg Ulcer Primary Etiology: Anemia, Arrhythmia, Congestive Heart Anemia, Arrhythmia, Congestive Heart Anemia, Arrhythmia, Congestive Heart Comorbid History: Failure, Hypertension, Colitis, Failure, Hypertension, Colitis, Failure, Hypertension, Colitis, Osteoarthritis Osteoarthritis Osteoarthritis 10/11/2020 04/20/2020 09/27/2020 Date Acquired: 0 17 2 Weeks of Treatment: Open Open Open Wound Status: No Yes No Wound Recurrence: 1x0.6x0.1 3x2x0.1  0.7x0.5x0.1 Measurements L x W x D (cm) 0.471 4.712 0.275 A (cm) : rea 0.047 0.471 0.027 Volume (cm) : 0.00% -53.40% 41.60% % Reduction in Area: 0.00% -53.40% 42.60% % Reduction in Volume: Full Thickness Without Exposed Full Thickness Without Exposed Full Thickness Without Exposed Classification: Support Structures Support Structures Support Structures Medium Medium Medium Exudate Amount: Serosanguineous Serosanguineous Serosanguineous Exudate Type: red, brown red, brown red, brown Exudate Color:  Distinct, outline attached Distinct, outline attached Distinct, outline attached Wound Margin: Large (67-100%) Large (67-100%) Large (67-100%) Granulation Amount: Red Red, Pink Pink Granulation Quality: None Present (0%) Small (1-33%) None Present (0%) Necrotic Amount: Fat Layer (Subcutaneous Tissue): Yes Fat Layer (Subcutaneous Tissue): Yes Fat Layer (Subcutaneous Tissue): Yes Exposed Structures: Fascia: No Fascia: No Fascia: No Tendon: No Tendon: No Tendon: No Muscle: No Muscle: No Muscle: No Joint: No Joint: No Joint: No Bone: No Bone: No Bone: No None None Small (1-33%) Epithelialization: Wound Number: 9 N/A N/A Photos: N/A N/A Left, Posterior Lower Leg N/A N/A Wound Location: Gradually Appeared N/A N/A Wounding Event: Venous Leg Ulcer N/A N/A Primary Etiology: Anemia, Arrhythmia, Congestive Heart N/A N/A Comorbid History: Failure, Hypertension, Colitis, Osteoarthritis 10/04/2020 N/A N/A Date Acquired: 1 N/A N/A Weeks of Treatment: Open N/A N/A Wound Status: No N/A N/A Wound Recurrence: 2x0.5x0.1 N/A N/A Measurements L x W x D (cm) 0.785 N/A N/A A (cm) : rea 0.079 N/A N/A Volume (cm) : -317.60% N/A N/A % Reduction in Area: -315.80% N/A N/A % Reduction in Volume: Full Thickness Without Exposed N/A N/A Classification: Support Structures Medium N/A N/A Exudate Amount: Serosanguineous N/A N/A Exudate Type: red, brown N/A N/A Exudate  Color: Distinct, outline attached N/A N/A Wound Margin: Large (67-100%) N/A N/A Granulation Amount: Red N/A N/A Granulation Quality: Small (1-33%) N/A N/A Necrotic Amount: Fat Layer (Subcutaneous Tissue): Yes N/A N/A Exposed Structures: Fascia: No Tendon: No Muscle: No Joint: No Bone: No None N/A N/A Epithelialization: Treatment Notes Electronic Signature(s) Signed: 10/11/2020 1:25:11 PM By: Kalman Shan DO Signed: 10/11/2020 4:57:50 PM By: Lorrin Jackson Entered By: Kalman Shan on 10/11/2020 13:17:51 -------------------------------------------------------------------------------- Multi-Disciplinary Care Plan Details Patient Name: Date of Service: [name], [name] 10/11/2020 11:15 A M Medical Record Number: [id number] Patient Account Number: [id number] Date of Birth/Sex: Treating RN: [birth year] (87 y.o. [name] Primary Care Dracen Reigle: [name] Other Clinician: Referring Daven Montz: Treating Maudean Hoffmann/Extender: [name] in Treatment: [place] reviewed with physician Active Inactive Wound/Skin Impairment Nursing Diagnoses: Impaired tissue integrity Knowledge deficit related to ulceration/compromised skin integrity Goals: Patient/caregiver will verbalize understanding of skin care regimen Date Initiated: 06/11/2020 Target Resolution Date: 11/15/2020 Goal Status: Active Interventions: Assess patient/caregiver ability to obtain necessary supplies Assess patient/caregiver ability to perform ulcer/skin care regimen upon admission and as needed Assess ulceration(s) every visit Provide education on ulcer and skin care Notes: 10/11/20: Wound care regimen ongoing. Electronic Signature(s) Signed: 10/11/2020 4:57:50 PM By: Lorrin Jackson Entered By: Lorrin Jackson on 10/11/2020 16:12:30 -------------------------------------------------------------------------------- Pain Assessment  Details Patient Name: Date of Service: [name], [name] [place]. 10/11/2020 11:15 A M Medical Record Number: [id number] Patient Account Number: [id number] Date of Birth/Sex: Treating RN: 05/08/1925 (87 y.o. [name] Primary Care Ayansh Feutz: [name] Other Clinician: Referring Dali Kraner: Treating Amantha Sklar/Extender: [name] in Treatment: 17 Active Problems Location of Pain Severity and Description of Pain Patient Has Paino No Site Locations Pain Management and Medication Current Pain Management: Electronic Signature(s) Signed: 10/11/2020 4:57:50 PM By: Lorrin Jackson Entered By: Lorrin Jackson on 10/11/2020 11:26:52 -------------------------------------------------------------------------------- Patient/Caregiver Education Details Patient Name: Date of Service: [name], Tekila A. 9/22/2022andnbsp11:15 A M Medical Record Number: [id number] Patient Account Number: [id number] Date of Birth/Gender: Treating RN: 02/02/1925 (87 y.o. [name] Primary Care Physician: [name] Other Clinician: Referring Physician: Treating Physician/Extender: [name] in Treatment: 17 Education Assessment Education Provided To: Patient Education Topics Provided  Venous: Methods: Explain/Verbal, Printed Responses: State content correctly Wound/Skin Impairment: Methods: Explain/Verbal, Printed Responses: State content correctly Electronic Signature(s) Signed: 10/11/2020 4:57:50 PM By: Lorrin Jackson Entered By: Lorrin Jackson on 10/11/2020 16:12:52 -------------------------------------------------------------------------------- Wound Assessment Details Patient Name: Date of Service: [name], [place]. 10/11/2020 11:15 A M Medical Record Number: [id number] Patient Account Number: [id number] Date of Birth/Sex: Treating RN: 08/09/1925 (87 y.o. [name] Primary Care Yanin Muhlestein: [name] Other Clinician: Referring Kaya Klausing: Treating Ailyn Gladd/Extender: [name] in Treatment: 17 Wound Status Wound Number: 10 Primary Venous Leg Ulcer Etiology: Wound Location: Left, Medial Lower Leg Wound Open Wounding Event: Gradually Appeared Status: Date Acquired: 10/11/2020 Comorbid Anemia, Arrhythmia, Congestive Heart Failure, Hypertension, Weeks Of Treatment: 0 History: Colitis, Osteoarthritis Clustered Wound: No Photos Wound Measurements Length: (cm) 1 Width: (cm) 0.6 Depth: (cm) 0.1 Area: (cm) 0.471 Volume: (cm) 0.047 % Reduction in Area: 0% % Reduction in Volume: 0% Epithelialization: None Tunneling: No Undermining: No Wound Description Classification: Full Thickness Without Exposed Support Structures Wound Margin: Distinct, outline attached Exudate Amount: Medium Exudate Type: Serosanguineous Exudate Color: red, brown Foul Odor After Cleansing: No Slough/Fibrino No Wound Bed Granulation Amount: Large (67-100%) Exposed Structure Granulation Quality: Red Fascia Exposed: No Necrotic Amount: None Present (0%) Fat Layer (Subcutaneous Tissue) Exposed: Yes Tendon Exposed: No Muscle Exposed: No Joint Exposed: No Bone Exposed: No Electronic Signature(s) Signed: 10/11/2020 4:57:50 PM By: Lorrin Jackson Entered By: Lorrin Jackson on 10/11/2020 11:59:57 -------------------------------------------------------------------------------- Wound Assessment Details Patient Name: Date of Service: [name], [name] 10/11/2020 11:15 A M Medical Record Number: [id number] Patient Account Number: [id number] Date of Birth/Sex: Treating RN: [birth year] (87 y.o. [name] Primary Care Criston Chancellor: [name] Other Clinician: Referring Jamiya Nims: Treating Amyjo Mizrachi/Extender: [name] in Treatment: 17 Wound Status Wound Number: 7R Primary Malignant Wound Etiology: Wound Location: Left,  Anterior Lower Leg Wound Open Wounding Event: Blister Status: Date Acquired: 04/20/2020 Comorbid Anemia, Arrhythmia, Congestive Heart Failure, Hypertension, Weeks Of Treatment: 17 History: Colitis, Osteoarthritis Clustered Wound: No Photos Wound Measurements Length: (cm) 3 Width: (cm) 2 Depth: (cm) 0.1 Area: (cm) 4.712 Volume: (cm) 0.471 % Reduction in Area: -53.4% % Reduction in Volume: -53.4% Epithelialization: None Tunneling: No Undermining: No Wound Description Classification: Full Thickness Without Exposed Support Structures Wound Margin: Distinct, outline attached Exudate Amount: Medium Exudate Type: Serosanguineous Exudate Color: red, brown Foul Odor After Cleansing: No Slough/Fibrino Yes Wound Bed Granulation Amount: Large (67-100%) Exposed Structure Granulation Quality: Red, Pink Fascia Exposed: No Necrotic Amount: Small (1-33%) Fat Layer (Subcutaneous Tissue) Exposed: Yes Necrotic Quality: Adherent Slough Tendon Exposed: No Muscle Exposed: No Joint Exposed: No Bone Exposed: No Electronic Signature(s) Signed: 10/11/2020 4:57:50 PM By: Lorrin Jackson Entered By: Lorrin Jackson on 10/11/2020 12:00:34 -------------------------------------------------------------------------------- Wound Assessment Details Patient Name: Date of Service: [name], [name] 10/11/2020 11:15 A M Medical Record Number: [id number] Patient Account Number: [id number] Date of Birth/Sex: Treating RN: 25-Oct-1925 (87 y.o. [name] Primary Care Juniel Groene: [name] Other Clinician: Referring Wenceslaus Gist: Treating Johncharles Fusselman/Extender: [name] in Treatment: 17 Wound Status Wound Number: 8 Primary Venous Leg Ulcer Etiology: Wound Location: Right, Lateral Lower Leg Wound Open Wounding Event: Gradually Appeared Status: Date Acquired: 09/27/2020 Comorbid Anemia, Arrhythmia, Congestive Heart Failure, Hypertension, Weeks Of Treatment:  2 History: Colitis, Osteoarthritis Clustered Wound: No Photos Wound Measurements Length: (cm) 0.7 Width: (cm) 0.5 Depth: (cm) 0.1 Area: (cm) 0.275 Volume: (cm) 0.027 % Reduction in Area: 41.6% % Reduction in  Volume: 42.6% Epithelialization: Small (1-33%) Tunneling: No Undermining: No Wound Description Classification: Full Thickness Without Exposed Support Structures Wound Margin: Distinct, outline attached Exudate Amount: Medium Exudate Type: Serosanguineous Exudate Color: red, brown Foul Odor After Cleansing: No Slough/Fibrino No Wound Bed Granulation Amount: Large (67-100%) Exposed Structure Granulation Quality: Pink Fascia Exposed: No Necrotic Amount: None Present (0%) Fat Layer (Subcutaneous Tissue) Exposed: Yes Tendon Exposed: No Muscle Exposed: No Joint Exposed: No Bone Exposed: No Electronic Signature(s) Signed: 10/11/2020 4:57:50 PM By: Lorrin Jackson Entered By: Lorrin Jackson on 10/11/2020 12:04:22 -------------------------------------------------------------------------------- Wound Assessment Details Patient Name: Date of Service: [name], [name] 10/11/2020 11:15 A M Medical Record Number: [id number] Patient Account Number: [id number] Date of Birth/Sex: Treating RN: Feb 09, 1925 (87 y.o. [name] Primary Care Joren Rehm: [name] Other Clinician: Referring Yitty Roads: Treating Mikela Senn/Extender: [name] in Treatment: 17 Wound Status Wound Number: 9 Primary Venous Leg Ulcer Etiology: Wound Location: Left, Posterior Lower Leg Wound Open Wounding Event: Gradually Appeared Status: Date Acquired: 10/04/2020 Date Acquired: 10/04/2020 Comorbid Anemia, Arrhythmia, Congestive Heart Failure, Hypertension, Weeks Of Treatment: 1 History: Colitis, Osteoarthritis Clustered Wound: No Photos Wound Measurements Length: (cm) 2 Width: (cm) 0.5 Depth: (cm) 0.1 Area: (cm) 0.785 Volume: (cm) 0.079 %  Reduction in Area: -317.6% % Reduction in Volume: -315.8% Epithelialization: None Tunneling: No Undermining: No Wound Description Classification: Full Thickness Without Exposed Support Structures Wound Margin: Distinct, outline attached Exudate Amount: Medium Exudate Type: Serosanguineous Exudate Color: red, brown Foul Odor After Cleansing: No Slough/Fibrino Yes Wound Bed Granulation Amount: Large (67-100%) Exposed Structure Granulation Quality: Red Fascia Exposed: No Necrotic Amount: Small (1-33%) Fat Layer (Subcutaneous Tissue) Exposed: Yes Tendon Exposed: No Muscle Exposed: No Joint Exposed: No Bone Exposed: No Electronic Signature(s) Signed: 10/11/2020 4:57:50 PM By: Lorrin Jackson Entered By: Lorrin Jackson on 10/11/2020 12:03:29 -------------------------------------------------------------------------------- Vitals Details Patient Name: Date of Service: [name], [name] 10/11/2020 11:15 A M Medical Record Number: [id number] Patient Account Number: [id number] Date of Birth/Sex: Treating RN: Jun 12, 1925 (87 y.o. [name] Primary Care Deland Slocumb: [name] Other Clinician: Referring Dawon Troop: Treating Damyiah Moxley/Extender: [name] in Treatment: 17 Vital Signs Time Taken: 11:28 Temperature (F): 98.6 Height (in): 65 Pulse (bpm): 61 Weight (lbs): 163 Respiratory Rate (breaths/min): 18 Body Mass Index (BMI): 27.1 Blood Pressure (mmHg): 129/72 Reference Range: 80 - 120 mg / dl Electronic Signature(s) Signed: 10/11/2020 4:57:50 PM By: Lorrin Jackson Entered By: Lorrin Jackson on 10/11/2020 11:29:20

## 2020-10-11 NOTE — Progress Notes (Signed)
CHAR FELTMAN ([id number]) , Visit Report for 10/11/2020 Chief Complaint Document Details Patient Name: Date of Service: [name], [place]. 10/11/2020 11:15 A M Medical Record Number: [id number] Patient Account Number: [id number] Date of Birth/Sex: Treating RN: 10-Feb-1925 (87 y.o. [name] Primary Care Provider: [name] Other Clinician: Referring Provider: Treating Provider/Extender: [name] in Treatment: 17 Information Obtained from: Patient Chief Complaint Bilateral lower extremity wounds that have been biopsied and positive for squamous cell carcinoma Electronic Signature(s) Signed: 10/11/2020 1:25:11 PM By: Kalman Shan DO Entered By: Kalman Shan on 10/11/2020 13:18:00 -------------------------------------------------------------------------------- HPI Details Patient Name: Date of Service: [name], [place]ac[place] A. 10/11/2020 11:15 A M Medical Record Number: [id number] Patient Account Number: [id number] Date of Birth/Sex: Treating RN: 1925-07-23 (87 y.o. [name] Primary Care Provider: [name] Other Clinician: Referring Provider: Treating Provider/Extender: [name] in Treatment: 17 History of Present Illness Location: left leg HPI Description: Admission 5/23 Ms. 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 She states that the wound on the right has been present for a year. The 1 on the left has opened up 1 month ago. She is followed with oncology for this issue as she had biopsies that showed squamous cell carcinoma. She is also seeing radiation oncology for treatment options. She presents today because she would like for her wounds to  be healed by [place]. She currently denies signs of infection. 6/1; patient presents for 1 week follow-up. She states she has tolerated the leg wraps well. She states these do not bother her and is happy to continue with them. She is scheduled to see her oncologist today to go over treatment options for the bilateral lower extremity squamous cell carcinoma. Radiation is currently not a recommended option. Patient states she overall feels well. 6/22; patient presents for 3-week follow-up. She has tolerated the wraps well until her last wrap where she states they were uncomfortable. She attributes this to the home health nurse. She denies signs of infection. She has started her first treatment of antibody infusions for her Bilateral lower extremity squamous cell carcinoma. She has no complaints today. 7/21; patient presents for 1 month follow-up. Unfortunately she has not had good experience with her wrap changes with home health. She would like to do her own dressing changes. She continues to do her antibody infusions. She denies signs of infection. 7/28; patient presents for 1 week follow-up. At last clinic visit she was switched to daily dressing changes due to issues with the wrap and home health placing them. Unfortunately she has developed weeping to her legs bilaterally. She would like to be placed in wraps today. She would also like to follow with [place] weekly for wrap changes instead of having home health change them. She denies signs of infection. 8/4; patient presents for 1 week follow-up. She has tolerated the Kerlix/Coban wraps well. She no longer has weeping to her legs. She took the wrap off 1 day before coming in to be able to take a shower. She has no issues or complaints today. She denies signs of infection. 8/18; patient presents for follow-up. Patient has tolerated the wraps well. She brought her Velcro compression wraps today. She has no issues or complaints today. She had her chemotherapy  infusion yesterday without issues. She denies signs of infection. 8/25; patient presents for follow-up. She used her juxta lite compressions for the past week. It is unclear if she is able to put these on correctly since she states she has a hard time getting them to look right. She reports 2 open wounds. She currently denies signs of infection. 9/1; patient presents for 1 week follow-up. She has 1 open wound. She tolerated the compression wraps well. She currently denies signs of infection. 9/8; patient presents for 1 week follow-up. She has 2 open wounds 1 on each leg. She has tolerated the compression wrap well. She currently denies signs of infection. 9/15; patient presents for 1 week follow-up. She now has 3 wounds. 2 on the left and 1 on the right. She continues to tolerate the compression wrap well. She currently denies signs of infection. She has obtained furosemide by her primary care physician and would like to discuss when to take this. 9/22; patient presents for 1 week follow-up. She has scattered wounds on her lower extremities bilaterally. She did take furosemide twice in the past week. She does not recall having to urinate more frequently. She tolerated the 3 layer compression well. She denies signs of infection. Electronic Signature(s) Signed: 10/11/2020 1:25:11 PM By: Kalman Shan DO Entered By: Kalman Shan on 10/11/2020 13:18:49 -------------------------------------------------------------------------------- Physical Exam Details Patient Name: Date of Service: [name], [place]. 10/11/2020 11:15 A M Medical Record Number: [id number] Patient Account Number: [id number] Date of Birth/Sex: Treating RN: 04/16/1925 (87 y.o. [name] Primary Care Provider: [name] Other Clinician: Referring Provider: Treating Provider/Extender: [name] in Treatment: 17 Constitutional respirations regular, non-labored and within target  range for patient.. Cardiovascular 2+ dorsalis pedis/posterior tibialis pulses. Psychiatric pleasant and cooperative. Notes Patient has open wounds limited to skin breakdown surrounded by dried lymphedema fluid. Previous squamous cell carcinoma lesions continue to appear resolved. No signs of infection on exam. Swelling is under better control. There is more maceration noted. Electronic Signature(s) Signed: 10/11/2020 1:25:11 PM By: Kalman Shan DO Entered By: Kalman Shan on 10/11/2020 13:19:45 -------------------------------------------------------------------------------- Physician Orders Details Patient Name: Date of Service: [name], [place]. 10/11/2020 11:15 A M Medical Record Number: [id number] Patient Account Number: [id number] Date of Birth/Sex: Treating RN: [birth year] (87 y.o. [name] Primary Care Provider: [name] Other Clinician: Referring Provider: Treating Provider/Extender: [name] in Treatment: 57 Verbal / Phone Orders: No Diagnosis Coding ICD-10 Coding Code Description C44.92 Squamous cell carcinoma of skin, unspecified L97.819 Non-pressure chronic ulcer of other part of right lower leg with unspecified severity L97.829 Non-pressure chronic ulcer of other part of left lower leg with unspecified severity I87.2 Venous insufficiency (chronic) (peripheral) Follow-up Appointments ppointment in 2 weeks. - with Dr. Heber [place] Return A Nurse Visit: - Next Week for wrap change Bathing/ Shower/ Hygiene May shower with protection but do not get wound dressing(s) wet. - Use cast protector Edema Control - Lymphedema / SCD / Other Elevate legs to the level of the heart or above for 30 minutes daily and/or when sitting, a frequency of: - elevate the legs throughout the day heart level if possible. Avoid standing for long periods of time. Exercise regularly Additional Orders / Instructions Follow Nutritious  Diet Wound Treatment Wound #10 - Lower Leg Wound Laterality: Left, Medial Cleanser: Soap and Water 1 x Per Week/15 Days Discharge Instructions: May shower and wash wound with dial antibacterial soap and water prior to  dressing change. Cleanser: Wound Cleanser 1 x Per Week/15 Days Discharge Instructions: Cleanse the wound with wound cleanser prior to applying a clean dressing using gauze sponges, not tissue or cotton balls. Peri-Wound Care: Triamcinolone 15 (g) 1 x Per Week/15 Days Discharge Instructions: In clinic only. Use triamcinolone 15 (g) in clinic only. Peri-Wound Care: Sween Lotion (Moisturizing lotion) 1 x Per Week/15 Days Discharge Instructions: Apply moisturizing lotion as directed Prim Dressing: KerraCel Ag Gelling Fiber Dressing, 4x5 in (silver alginate) 1 x Per Week/15 Days ary Discharge Instructions: Apply silver alginate to wound bed as instructed Secondary Dressing: Woven Gauze Sponge, Non-Sterile 4x4 in 1 x Per Week/15 Days Discharge Instructions: Apply over primary dressing as directed. Secondary Dressing: ABD Pad, 8x10 1 x Per Week/15 Days Discharge Instructions: Apply over primary dressing as directed. Compression Wrap: Kerlix Roll 4.5x3.1 (in/yd) 1 x Per Week/15 Days Discharge Instructions: Apply Kerlix and Coban compression as directed. Compression Wrap: Coban Self-Adherent Wrap 4x5 (in/yd) 1 x Per Week/15 Days Discharge Instructions: Apply over Kerlix as directed. Wound #7R - Lower Leg Wound Laterality: Left, Anterior Cleanser: Soap and Water 1 x Per Week/15 Days Discharge Instructions: May shower and wash wound with dial antibacterial soap and water prior to dressing change. Cleanser: Wound Cleanser 1 x Per Week/15 Days Discharge Instructions: Cleanse the wound with wound cleanser prior to applying a clean dressing using gauze sponges, not tissue or cotton balls. Peri-Wound Care: Triamcinolone 15 (g) 1 x Per Week/15 Days Discharge Instructions: In clinic only. Use  triamcinolone 15 (g) in clinic only. Peri-Wound Care: Sween Lotion (Moisturizing lotion) 1 x Per Week/15 Days Discharge Instructions: Apply moisturizing lotion as directed Prim Dressing: KerraCel Ag Gelling Fiber Dressing, 4x5 in (silver alginate) 1 x Per Week/15 Days ary Discharge Instructions: Apply silver alginate to wound bed as instructed Secondary Dressing: Woven Gauze Sponge, Non-Sterile 4x4 in 1 x Per Week/15 Days Discharge Instructions: Apply over primary dressing as directed. Secondary Dressing: ABD Pad, 8x10 1 x Per Week/15 Days Discharge Instructions: Apply over primary dressing as directed. Compression Wrap: Kerlix Roll 4.5x3.1 (in/yd) 1 x Per Week/15 Days Discharge Instructions: Apply Kerlix and Coban compression as directed. Compression Wrap: Coban Self-Adherent Wrap 4x5 (in/yd) 1 x Per Week/15 Days Discharge Instructions: Apply over Kerlix as directed. Wound #8 - Lower Leg Wound Laterality: Right, Lateral Cleanser: Soap and Water 1 x Per Week/15 Days Discharge Instructions: May shower and wash wound with dial antibacterial soap and water prior to dressing change. Cleanser: Wound Cleanser 1 x Per Week/15 Days Discharge Instructions: Cleanse the wound with wound cleanser prior to applying a clean dressing using gauze sponges, not tissue or cotton balls. Peri-Wound Care: Triamcinolone 15 (g) 1 x Per Week/15 Days Discharge Instructions: In clinic only. Use triamcinolone 15 (g) in clinic only. Peri-Wound Care: Sween Lotion (Moisturizing lotion) 1 x Per Week/15 Days Discharge Instructions: Apply moisturizing lotion as directed Prim Dressing: KerraCel Ag Gelling Fiber Dressing, 4x5 in (silver alginate) 1 x Per Week/15 Days ary Discharge Instructions: Apply silver alginate to wound bed as instructed Secondary Dressing: Woven Gauze Sponge, Non-Sterile 4x4 in 1 x Per Week/15 Days Discharge Instructions: Apply over primary dressing as directed. Secondary Dressing: ABD Pad, 8x10 1 x  Per Week/15 Days Discharge Instructions: Apply over primary dressing as directed. Compression Wrap: Kerlix Roll 4.5x3.1 (in/yd) 1 x Per Week/15 Days Discharge Instructions: Apply Kerlix and Coban compression as directed. Compression Wrap: Coban Self-Adherent Wrap 4x5 (in/yd) 1 x Per Week/15 Days Discharge Instructions: Apply over Kerlix as directed. Wound #  9 - Lower Leg Wound Laterality: Left, Posterior Cleanser: Soap and Water 1 x Per Week/15 Days Discharge Instructions: May shower and wash wound with dial antibacterial soap and water prior to dressing change. Cleanser: Wound Cleanser 1 x Per Week/15 Days Discharge Instructions: Cleanse the wound with wound cleanser prior to applying a clean dressing using gauze sponges, not tissue or cotton balls. Peri-Wound Care: Triamcinolone 15 (g) 1 x Per Week/15 Days Discharge Instructions: In clinic only. Use triamcinolone 15 (g) in clinic only. Peri-Wound Care: Sween Lotion (Moisturizing lotion) 1 x Per Week/15 Days Discharge Instructions: Apply moisturizing lotion as directed Prim Dressing: KerraCel Ag Gelling Fiber Dressing, 4x5 in (silver alginate) 1 x Per Week/15 Days ary Discharge Instructions: Apply silver alginate to wound bed as instructed Secondary Dressing: Woven Gauze Sponge, Non-Sterile 4x4 in 1 x Per Week/15 Days Discharge Instructions: Apply over primary dressing as directed. Secondary Dressing: ABD Pad, 8x10 1 x Per Week/15 Days Discharge Instructions: Apply over primary dressing as directed. Compression Wrap: Kerlix Roll 4.5x3.1 (in/yd) 1 x Per Week/15 Days Discharge Instructions: Apply Kerlix and Coban compression as directed. Compression Wrap: Coban Self-Adherent Wrap 4x5 (in/yd) 1 x Per Week/15 Days Discharge Instructions: Apply over Kerlix as directed. Electronic Signature(s) Signed: 10/11/2020 1:25:11 PM By: Kalman Shan DO Entered By: Kalman Shan on 10/11/2020  13:20:02 -------------------------------------------------------------------------------- Problem List Details Patient Name: Date of Service: [name], [place]. 10/11/2020 11:15 A M Medical Record Number: [id number] Patient Account Number: [id number] Date of Birth/Sex: Treating RN: 1925-12-07 (87 y.o. [name] Primary Care Provider: [name] Other Clinician: Referring Provider: Treating Provider/Extender: [name] in Treatment: 17 Active Problems ICD-10 Encounter Code Description Active Date MDM Diagnosis C44.92 Squamous cell carcinoma of skin, unspecified 06/11/2020 No Yes L97.819 Non-pressure chronic ulcer of other part of right lower leg with unspecified 06/11/2020 No Yes severity L97.829 Non-pressure chronic ulcer of other part of left lower leg with unspecified 06/11/2020 No Yes severity I87.2 Venous insufficiency (chronic) (peripheral) 06/11/2020 No Yes Inactive Problems Resolved Problems Electronic Signature(s) Signed: 10/11/2020 1:25:11 PM By: Kalman Shan DO Entered By: Kalman Shan on 10/11/2020 13:17:45 -------------------------------------------------------------------------------- Progress Note Details Patient Name: Date of Service: [name], [place] A. 10/11/2020 11:15 A M Medical Record Number: [id number] Patient Account Number: [id number] Date of Birth/Sex: Treating RN: 09/07/1925 (87 y.o. [name] Primary Care Provider: [name] Other Clinician: Referring Provider: Treating Provider/Extender: [name] in Treatment: 17 Subjective Chief Complaint Information obtained from Patient Bilateral lower extremity wounds that have been biopsied and positive for squamous cell carcinoma History of Present Illness (HPI) The following HPI elements were documented for the patient's wound: Location: left leg Admission 5/23 Ms. Rivers Gassmann is a 87 year old female  with a past medical history of 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 She states that the wound on the right has been present for a year. The 1 on the left has opened up 1 month ago. She is followed with oncology for this issue as she had biopsies that showed squamous cell carcinoma. She is also seeing radiation oncology for treatment options. She presents today because she would like for her wounds to be healed by [place]. She currently denies signs of infection. 6/1; patient presents for 1 week follow-up. She states she has tolerated the leg wraps well. She states these  do not bother her and is happy to continue with them. She is scheduled to see her oncologist today to go over treatment options for the bilateral lower extremity squamous cell carcinoma. Radiation is currently not a recommended option. Patient states she overall feels well. 6/22; patient presents for 3-week follow-up. She has tolerated the wraps well until her last wrap where she states they were uncomfortable. She attributes this to the home health nurse. She denies signs of infection. She has started her first treatment of antibody infusions for her Bilateral lower extremity squamous cell carcinoma. She has no complaints today. 7/21; patient presents for 1 month follow-up. Unfortunately she has not had good experience with her wrap changes with home health. She would like to do her own dressing changes. She continues to do her antibody infusions. She denies signs of infection. 7/28; patient presents for 1 week follow-up. At last clinic visit she was switched to daily dressing changes due to issues with the wrap and home health placing them. Unfortunately she has developed weeping to her legs bilaterally. She would like to be placed in wraps today. She would also like to  follow with [place] weekly for wrap changes instead of having home health change them. She denies signs of infection. 8/4; patient presents for 1 week follow-up. She has tolerated the Kerlix/Coban wraps well. She no longer has weeping to her legs. She took the wrap off 1 day before coming in to be able to take a shower. She has no issues or complaints today. She denies signs of infection. 8/18; patient presents for follow-up. Patient has tolerated the wraps well. She brought her Velcro compression wraps today. She has no issues or complaints today. She had her chemotherapy infusion yesterday without issues. She denies signs of infection. 8/25; patient presents for follow-up. She used her juxta lite compressions for the past week. It is unclear if she is able to put these on correctly since she states she has a hard time getting them to look right. She reports 2 open wounds. She currently denies signs of infection. 9/1; patient presents for 1 week follow-up. She has 1 open wound. She tolerated the compression wraps well. She currently denies signs of infection. 9/8; patient presents for 1 week follow-up. She has 2 open wounds 1 on each leg. She has tolerated the compression wrap well. She currently denies signs of infection. 9/15; patient presents for 1 week follow-up. She now has 3 wounds. 2 on the left and 1 on the right. She continues to tolerate the compression wrap well. She currently denies signs of infection. She has obtained furosemide by her primary care physician and would like to discuss when to take this. 9/22; patient presents for 1 week follow-up. She has scattered wounds on her lower extremities bilaterally. She did take furosemide twice in the past week. She does not recall having to urinate more frequently. She tolerated the 3 layer compression well. She denies signs of infection. Patient History Information obtained from Patient. Family History Unknown History. Social History Never  smoker, Marital Status - Single, Alcohol Use - Never, Drug Use - No History, Caffeine Use - Never. Medical History Eyes Denies history of Cataracts, Optic Neuritis Ear/Nose/Mouth/Throat Denies history of Chronic sinus problems/congestion, Middle ear problems Hematologic/Lymphatic Patient has history of Anemia Denies history of Hemophilia, Human Immunodeficiency Virus, Lymphedema, Sickle Cell Disease Respiratory Denies history of Aspiration, Asthma, Chronic Obstructive Pulmonary Disease (COPD), Pneumothorax, Sleep Apnea, Tuberculosis Cardiovascular Patient has history of Arrhythmia - Atrial Flutter,  A fibb, Congestive Heart Failure, Hypertension Denies history of Angina, Coronary Artery Disease, Deep Vein Thrombosis, Hypotension, Myocardial Infarction, Peripheral Arterial Disease, Peripheral Venous Disease, Phlebitis, Vasculitis Gastrointestinal Patient has history of Colitis Denies history of Cirrhosis , Crohnoos, Hepatitis A, Hepatitis B, Hepatitis C Endocrine Denies history of Type I Diabetes, Type II Diabetes Genitourinary Denies history of End Stage Renal Disease Immunological Denies history of Lupus Erythematosus, Raynaudoos, Scleroderma Integumentary (Skin) Denies history of History of Burn Musculoskeletal Patient has history of Osteoarthritis Denies history of Gout, Rheumatoid Arthritis, Osteomyelitis Neurologic Denies history of Dementia, Neuropathy, Quadriplegia, Paraplegia, Seizure Disorder Oncologic Denies history of Received Chemotherapy, Received Radiation Hospitalization/Surgery History - removal of rod left leg. 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 Vitals Time Taken: 11:28 AM, Height: 65 in, Weight: 163 lbs, BMI: 27.1, Temperature:  98.6 F, Pulse: 61 bpm, Respiratory Rate: 18 breaths/min, Blood Pressure: 129/72 mmHg. Cardiovascular 2+ dorsalis pedis/posterior tibialis pulses. Psychiatric pleasant and cooperative. General Notes: Patient has open wounds limited to skin breakdown surrounded by dried lymphedema fluid. Previous squamous cell carcinoma lesions continue to appear resolved. No signs of infection on exam. Swelling is under better control. There is more maceration noted. Integumentary (Hair, Skin) Wound #10 status is Open. Original cause of wound was Gradually Appeared. The date acquired was: 10/11/2020. The wound is located on the Left,Medial Lower Leg. The wound measures 1cm length x 0.6cm width x 0.1cm depth; 0.471cm^2 area and 0.047cm^3 volume. There is Fat Layer (Subcutaneous Tissue) exposed. There is no tunneling or undermining noted. There is a medium amount of serosanguineous drainage noted. The wound margin is distinct with the outline attached to the wound base. There is large (67-100%) red granulation within the wound bed. There is no necrotic tissue within the wound bed. Wound #7R status is Open. Original cause of wound was Blister. The date acquired was: 04/20/2020. The wound has been in treatment 17 weeks. The wound is located on the Left,Anterior Lower Leg. The wound measures 3cm length x 2cm width x 0.1cm depth; 4.712cm^2 area and 0.471cm^3 volume. There is Fat Layer (Subcutaneous Tissue) exposed. There is no tunneling or undermining noted. There is a medium amount of serosanguineous drainage noted. The wound margin is distinct with the outline attached to the wound base. There is large (67-100%) red, pink granulation within the wound bed. There is a small (1-33%) amount of necrotic tissue within the wound bed including Adherent Slough. Wound #8 status is Open. Original cause of wound was Gradually Appeared. The date acquired was: 09/27/2020. The wound has been in treatment 2 weeks. The wound is located on  the Right,Lateral Lower Leg. The wound measures 0.7cm length x 0.5cm width x 0.1cm depth; 0.275cm^2 area and 0.027cm^3 volume. There is Fat Layer (Subcutaneous Tissue) exposed. There is no tunneling or undermining noted. There is a medium amount of serosanguineous drainage noted. The wound margin is distinct with the outline attached to the wound base. There is large (67-100%) pink granulation within the wound bed. There is no necrotic tissue within the wound bed. Wound #9 status is Open. Original cause of wound was Gradually Appeared. The date acquired was: 10/04/2020. The wound has been in treatment 1 weeks. The wound is located on the Left,Posterior Lower Leg. The wound measures 2cm length x 0.5cm width x 0.1cm depth; 0.785cm^2 area and 0.079cm^3 volume. There is Fat Layer (Subcutaneous Tissue) exposed.  There is no tunneling or undermining noted. There is a medium amount of serosanguineous drainage noted. The wound margin is distinct with the outline attached to the wound base. There is large (67-100%) red granulation within the wound bed. There is a small (1-33%) amount of necrotic tissue within the wound bed. 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 She has more maceration and lymphedema fluid present. I will switch the dressing to silver alginate to help control the drainage. I will go back to Kerlix/Coban from 3 layer. I asked her to hold off on Lasix for now since she states it did not make much of a difference. No signs of infection on exam. Plan Follow-up Appointments: Return Appointment in 2 weeks. - with Dr. Heber [place] Nurse Visit: - Next Week for  wrap change Bathing/ Shower/ Hygiene: May shower with protection but do not get wound dressing(s) wet. - Use cast protector Edema Control - Lymphedema / SCD / Other: Elevate legs to the level of the heart or above for 30 minutes daily and/or when sitting, a frequency of: - elevate the legs throughout the day heart level if possible. Avoid standing for long periods of time. Exercise regularly Additional Orders / Instructions: Follow Nutritious Diet WOUND #10: - Lower Leg Wound Laterality: Left, Medial Cleanser: Soap and Water 1 x Per Week/15 Days Discharge Instructions: May shower and wash wound with dial antibacterial soap and water prior to dressing change. Cleanser: Wound Cleanser 1 x Per Week/15 Days Discharge Instructions: Cleanse the wound with wound cleanser prior to applying a clean dressing using gauze sponges, not tissue or cotton balls. Peri-Wound Care: Triamcinolone 15 (g) 1 x Per Week/15 Days Discharge Instructions: In clinic only. Use triamcinolone 15 (g) in clinic only. Peri-Wound Care: Sween Lotion (Moisturizing lotion) 1 x Per Week/15 Days Discharge Instructions: Apply moisturizing lotion as directed Prim Dressing: KerraCel Ag Gelling Fiber Dressing, 4x5 in (silver alginate) 1 x Per Week/15 Days ary Discharge Instructions: Apply silver alginate to wound bed as instructed Secondary Dressing: Woven Gauze Sponge, Non-Sterile 4x4 in 1 x Per Week/15 Days Discharge Instructions: Apply over primary dressing as directed. Secondary Dressing: ABD Pad, 8x10 1 x Per Week/15 Days Discharge Instructions: Apply over primary dressing as directed. Com pression Wrap: Kerlix Roll 4.5x3.1 (in/yd) 1 x Per Week/15 Days Discharge Instructions: Apply Kerlix and Coban compression as directed. Com pression Wrap: Coban Self-Adherent Wrap 4x5 (in/yd) 1 x Per Week/15 Days Discharge Instructions: Apply over Kerlix as directed. WOUND #7R: - Lower Leg Wound Laterality: Left, Anterior Cleanser: Soap  and Water 1 x Per Week/15 Days Discharge Instructions: May shower and wash wound with dial antibacterial soap and water prior to dressing change. Cleanser: Wound Cleanser 1 x Per Week/15 Days Discharge Instructions: Cleanse the wound with wound cleanser prior to applying a clean dressing using gauze sponges, not tissue or cotton balls. Peri-Wound Care: Triamcinolone 15 (g) 1 x Per Week/15 Days Discharge Instructions: In clinic only. Use triamcinolone 15 (g) in clinic only. Peri-Wound Care: Sween Lotion (Moisturizing lotion) 1 x Per Week/15 Days Discharge Instructions: Apply moisturizing lotion as directed Prim Dressing: KerraCel Ag Gelling Fiber Dressing, 4x5 in (silver alginate) 1 x Per Week/15 Days ary Discharge Instructions: Apply silver alginate to wound bed as  instructed Secondary Dressing: Woven Gauze Sponge, Non-Sterile 4x4 in 1 x Per Week/15 Days Discharge Instructions: Apply over primary dressing as directed. Secondary Dressing: ABD Pad, 8x10 1 x Per Week/15 Days Discharge Instructions: Apply over primary dressing as directed. Com pression Wrap: Kerlix Roll 4.5x3.1 (in/yd) 1 x Per Week/15 Days Discharge Instructions: Apply Kerlix and Coban compression as directed. Com pression Wrap: Coban Self-Adherent Wrap 4x5 (in/yd) 1 x Per Week/15 Days Discharge Instructions: Apply over Kerlix as directed. WOUND #8: - Lower Leg Wound Laterality: Right, Lateral Cleanser: Soap and Water 1 x Per Week/15 Days Discharge Instructions: May shower and wash wound with dial antibacterial soap and water prior to dressing change. Cleanser: Wound Cleanser 1 x Per Week/15 Days Discharge Instructions: Cleanse the wound with wound cleanser prior to applying a clean dressing using gauze sponges, not tissue or cotton balls. Peri-Wound Care: Triamcinolone 15 (g) 1 x Per Week/15 Days Discharge Instructions: In clinic only. Use triamcinolone 15 (g) in clinic only. Peri-Wound Care: Sween Lotion (Moisturizing lotion)  1 x Per Week/15 Days Discharge Instructions: Apply moisturizing lotion as directed Prim Dressing: KerraCel Ag Gelling Fiber Dressing, 4x5 in (silver alginate) 1 x Per Week/15 Days ary Discharge Instructions: Apply silver alginate to wound bed as instructed Secondary Dressing: Woven Gauze Sponge, Non-Sterile 4x4 in 1 x Per Week/15 Days Discharge Instructions: Apply over primary dressing as directed. Secondary Dressing: ABD Pad, 8x10 1 x Per Week/15 Days Discharge Instructions: Apply over primary dressing as directed. Com pression Wrap: Kerlix Roll 4.5x3.1 (in/yd) 1 x Per Week/15 Days Discharge Instructions: Apply Kerlix and Coban compression as directed. Com pression Wrap: Coban Self-Adherent Wrap 4x5 (in/yd) 1 x Per Week/15 Days Discharge Instructions: Apply over Kerlix as directed. WOUND #9: - Lower Leg Wound Laterality: Left, Posterior Cleanser: Soap and Water 1 x Per Week/15 Days Discharge Instructions: May shower and wash wound with dial antibacterial soap and water prior to dressing change. Cleanser: Wound Cleanser 1 x Per Week/15 Days Discharge Instructions: Cleanse the wound with wound cleanser prior to applying a clean dressing using gauze sponges, not tissue or cotton balls. Peri-Wound Care: Triamcinolone 15 (g) 1 x Per Week/15 Days Discharge Instructions: In clinic only. Use triamcinolone 15 (g) in clinic only. Peri-Wound Care: Sween Lotion (Moisturizing lotion) 1 x Per Week/15 Days Discharge Instructions: Apply moisturizing lotion as directed Prim Dressing: KerraCel Ag Gelling Fiber Dressing, 4x5 in (silver alginate) 1 x Per Week/15 Days ary Discharge Instructions: Apply silver alginate to wound bed as instructed Secondary Dressing: Woven Gauze Sponge, Non-Sterile 4x4 in 1 x Per Week/15 Days Discharge Instructions: Apply over primary dressing as directed. Secondary Dressing: ABD Pad, 8x10 1 x Per Week/15 Days Discharge Instructions: Apply over primary dressing as directed. Com  pression Wrap: Kerlix Roll 4.5x3.1 (in/yd) 1 x Per Week/15 Days Discharge Instructions: Apply Kerlix and Coban compression as directed. Com pression Wrap: Coban Self-Adherent Wrap 4x5 (in/yd) 1 x Per Week/15 Days Discharge Instructions: Apply over Kerlix as directed. 1. Silver alginate under Kerlix/Coban 2. Follow-up next week for nurse visit and wrap change and in 2 weeks with me Electronic Signature(s) Signed: 10/11/2020 1:25:11 PM By: Kalman Shan DO Entered By: Kalman Shan on 10/11/2020 13:24:45 -------------------------------------------------------------------------------- HxROS Details Patient Name: Date of Service: [name], [place]. 10/11/2020 11:15 A M Medical Record Number: [id number] Patient Account Number: [id number] Date of Birth/Sex: Treating RN: December 19, 1925 (87 y.o. [name] Primary Care Provider: [name] Other Clinician: Referring Provider: Treating Provider/Extender: [name] in Treatment:  17 Information Obtained From Patient Eyes Medical History: Negative for: Cataracts; Optic Neuritis Ear/Nose/Mouth/Throat Medical History: Negative for: Chronic sinus problems/congestion; Middle ear problems Hematologic/Lymphatic Medical History: Positive for: Anemia Negative for: Hemophilia; Human Immunodeficiency Virus; Lymphedema; Sickle Cell Disease Respiratory Medical History: Negative for: Aspiration; Asthma; Chronic Obstructive Pulmonary Disease (COPD); Pneumothorax; Sleep Apnea; Tuberculosis Cardiovascular Medical History: Positive for: Arrhythmia - Atrial Flutter, A fibb; Congestive Heart Failure; Hypertension Negative for: Angina; Coronary Artery Disease; Deep Vein Thrombosis; Hypotension; Myocardial Infarction; Peripheral Arterial Disease; Peripheral Venous Disease; Phlebitis; Vasculitis Past Medical History Notes: hyperlipidemia Gastrointestinal Medical History: Positive for: Colitis Negative  for: Cirrhosis ; Crohns; Hepatitis A; Hepatitis B; Hepatitis C Endocrine Medical History: Negative for: Type I Diabetes; Type II Diabetes Past Medical History Notes: hypothyroidism Genitourinary Medical History: Negative for: End Stage Renal Disease Immunological Medical History: Negative for: Lupus Erythematosus; Raynauds; Scleroderma Integumentary (Skin) Medical History: Negative for: History of Burn Musculoskeletal Medical History: Positive for: Osteoarthritis Negative for: Gout; Rheumatoid Arthritis; Osteomyelitis Neurologic Medical History: Negative for: Dementia; Neuropathy; Quadriplegia; Paraplegia; Seizure Disorder Past Medical History Notes: lumbar spindylolysis Oncologic Medical History: Negative for: Received Chemotherapy; Received Radiation Past Medical History Notes: BLE squamous ceel carcionoma Immunizations Pneumococcal Vaccine: Received Pneumococcal Vaccination: Yes Received Pneumococcal Vaccination On or After 60th Birthday: No Implantable Devices None Hospitalization / Surgery History Type of Hospitalization/Surgery removal of rod left leg Family and Social History Unknown History: Yes; Never smoker; Marital Status - Single; Alcohol Use: Never; Drug Use: No History; Caffeine Use: Never; Financial Concerns: No; Food, Clothing or Shelter Needs: No; Support System Lacking: No; Transportation Concerns: No Electronic Signature(s) Signed: 10/11/2020 1:25:11 PM By: Kalman Shan DO Signed: 10/11/2020 4:57:50 PM By: Lorrin Jackson Entered By: Kalman Shan on 10/11/2020 13:18:56 -------------------------------------------------------------------------------- SuperBill Details Patient Name: Date of Service: [name], [place]. 10/11/2020 Medical Record Number: [id number] Patient Account Number: [id number] Date of Birth/Sex: Treating RN: 1925-05-06 (87 y.o. [name] Primary Care Provider: [name] Other Clinician: Referring  Provider: Treating Provider/Extender: [name] in Treatment: 17 Diagnosis Coding ICD-10 Codes Code Description C44.92 Squamous cell carcinoma of skin, unspecified L97.819 Non-pressure chronic ulcer of other part of right lower leg with unspecified severity L97.829 Non-pressure chronic ulcer of other part of left lower leg with unspecified severity I87.2 Venous insufficiency (chronic) (peripheral) Facility Procedures CPT4 Code: [id number] Description: 20947 - WOUND CARE VISIT-LEV 4 EST PT Modifier: Quantity: 1 Physician Procedures Electronic Signature(s) Signed: 10/11/2020 4:24:13 PM By: Kalman Shan DO Signed: 10/11/2020 4:57:50 PM By: Lorrin Jackson Previous Signature: 10/11/2020 1:25:11 PM Version By: Kalman Shan DO Entered By: Lorrin Jackson on 10/11/2020 16:14:27

## 2020-10-15 ENCOUNTER — Other Ambulatory Visit: Payer: Self-pay | Admitting: *Deleted

## 2020-10-15 MED ORDER — METOPROLOL SUCCINATE ER 50 MG PO TB24: ORAL_TABLET | ORAL | 11 refills | Status: DC

## 2020-10-15 NOTE — Telephone Encounter (Signed)
Printed script and faxed manually to [place] now called [facility] [employer]. They have anew fax # [phone].Marland KitchenJohny Chess

## 2020-10-16 ENCOUNTER — Telehealth: Payer: Self-pay

## 2020-10-16 NOTE — Telephone Encounter (Addendum)
Prolia VOB initiated via [email or web address]  Last OV:  Next OV:  Last Prolia inj: 11/28/19 Next Prolia inj DUE:  05/28/20

## 2020-10-17 ENCOUNTER — Inpatient Hospital Stay: Payer: Medicare Other

## 2020-10-17 ENCOUNTER — Other Ambulatory Visit: Payer: Self-pay

## 2020-10-17 DIAGNOSIS — C50412 Malignant neoplasm of upper-outer quadrant of left female breast: Secondary | ICD-10-CM | POA: Diagnosis not present

## 2020-10-17 DIAGNOSIS — E064 Drug-induced thyroiditis: Secondary | ICD-10-CM

## 2020-10-17 DIAGNOSIS — Z5112 Encounter for antineoplastic immunotherapy: Secondary | ICD-10-CM | POA: Diagnosis not present

## 2020-10-17 DIAGNOSIS — Z79899 Other long term (current) drug therapy: Secondary | ICD-10-CM | POA: Diagnosis not present

## 2020-10-17 DIAGNOSIS — Z170 Estrogen receptor positive status [ER+]: Secondary | ICD-10-CM | POA: Diagnosis not present

## 2020-10-17 DIAGNOSIS — C44721 Squamous cell carcinoma of skin of unspecified lower limb, including hip: Secondary | ICD-10-CM

## 2020-10-17 DIAGNOSIS — C44729 Squamous cell carcinoma of skin of left lower limb, including hip: Secondary | ICD-10-CM | POA: Diagnosis not present

## 2020-10-17 DIAGNOSIS — C50912 Malignant neoplasm of unspecified site of left female breast: Secondary | ICD-10-CM

## 2020-10-17 DIAGNOSIS — C44722 Squamous cell carcinoma of skin of right lower limb, including hip: Secondary | ICD-10-CM | POA: Diagnosis not present

## 2020-10-17 LAB — CBC WITH DIFFERENTIAL/PLATELET
Abs Immature Granulocytes: 0.02 10*3/uL (ref 0.00–0.07)
Basophils Absolute: 0.1 10*3/uL (ref 0.0–0.1)
Basophils Relative: 1 %
Eosinophils Absolute: 0.2 10*3/uL (ref 0.0–0.5)
Eosinophils Relative: 3 %
HCT: 36.5 % (ref 36.0–46.0)
Hemoglobin: 12.1 g/dL (ref 12.0–15.0)
Immature Granulocytes: 0 %
Lymphocytes Relative: 23 %
Lymphs Abs: 1.3 10*3/uL (ref 0.7–4.0)
MCH: 34.6 pg — ABNORMAL HIGH (ref 26.0–34.0)
MCHC: 33.2 g/dL (ref 30.0–36.0)
MCV: 104.3 fL — ABNORMAL HIGH (ref 80.0–100.0)
Monocytes Absolute: 0.6 10*3/uL (ref 0.1–1.0)
Monocytes Relative: 11 %
Neutro Abs: 3.6 10*3/uL (ref 1.7–7.7)
Neutrophils Relative %: 62 %
Platelets: 217 10*3/uL (ref 150–400)
RBC: 3.5 MIL/uL — ABNORMAL LOW (ref 3.87–5.11)
RDW: 13.2 % (ref 11.5–15.5)
WBC: 5.8 10*3/uL (ref 4.0–10.5)
nRBC: 0 % (ref 0.0–0.2)

## 2020-10-17 LAB — COMPREHENSIVE METABOLIC PANEL
ALT: 14 U/L (ref 0–44)
AST: 21 U/L (ref 15–41)
Albumin: 3.5 g/dL (ref 3.5–5.0)
Alkaline Phosphatase: 69 U/L (ref 38–126)
Anion gap: 11 (ref 5–15)
BUN: 16 mg/dL (ref 8–23)
CO2: 27 mmol/L (ref 22–32)
Calcium: 9.9 mg/dL (ref 8.9–10.3)
Chloride: 102 mmol/L (ref 98–111)
Creatinine, Ser: 1.19 mg/dL — ABNORMAL HIGH (ref 0.44–1.00)
GFR, Estimated: 43 mL/min — ABNORMAL LOW (ref >60)
Glucose, Bld: 114 mg/dL — ABNORMAL HIGH (ref 70–99)
Potassium: 3.8 mmol/L (ref 3.5–5.1)
Sodium: 140 mmol/L (ref 135–145)
Total Bilirubin: 1 mg/dL (ref 0.3–1.2)
Total Protein: 7.5 g/dL (ref 6.5–8.1)

## 2020-10-17 LAB — TSH: TSH: 0.679 u[IU]/mL (ref 0.308–3.960)

## 2020-10-17 NOTE — Progress Notes (Signed)
Patient is here for D1C6 Libt[place]. Has a red, angry rash on both forearms, upper chest, and she reports ares on bilateral legs. She states that the rash has been itching so she has applied cortisol. Contacted Dr. Jana Hakim and treatment is to be held today and resume in 3 weeks, at patient next appt. Patient verbalized understanding.

## 2020-10-18 ENCOUNTER — Encounter (HOSPITAL_BASED_OUTPATIENT_CLINIC_OR_DEPARTMENT_OTHER): Payer: Medicare Other | Admitting: Internal Medicine

## 2020-10-18 DIAGNOSIS — Z853 Personal history of malignant neoplasm of breast: Secondary | ICD-10-CM | POA: Diagnosis not present

## 2020-10-18 DIAGNOSIS — L97819 Non-pressure chronic ulcer of other part of right lower leg with unspecified severity: Secondary | ICD-10-CM | POA: Diagnosis not present

## 2020-10-18 DIAGNOSIS — I872 Venous insufficiency (chronic) (peripheral): Secondary | ICD-10-CM | POA: Diagnosis not present

## 2020-10-18 DIAGNOSIS — L97829 Non-pressure chronic ulcer of other part of left lower leg with unspecified severity: Secondary | ICD-10-CM | POA: Diagnosis not present

## 2020-10-18 DIAGNOSIS — Z85828 Personal history of other malignant neoplasm of skin: Secondary | ICD-10-CM | POA: Diagnosis not present

## 2020-10-18 NOTE — Progress Notes (Signed)
[name] ([id number]) , Visit Report for 10/18/2020 Arrival Information Details Patient Name: Date of Service: [name], [name]. 10/18/2020 11:00 A M Medical Record Number: [id number] Patient Account Number: [id number] Date of Birth/Sex: Treating RN: Jan 04, 1926 (87 y.o. [name], [name] Primary Care Shuaib Corsino: [name] Other Clinician: Referring Rielynn Trulson: Treating Matisyn Cabeza/Extender: Thayer Headings in Treatment: 18 Visit Information History Since Last Visit Added or deleted any medications: No Patient Arrived: Walker Any new allergies or adverse reactions: No Arrival Time: 11:26 Had a fall or experienced change in No Accompanied By: self activities of daily living that may affect Transfer Assistance: Manual risk of falls: Patient Identification Verified: Yes Signs or symptoms of abuse/neglect since last visito No Secondary Verification Process Completed: Yes Hospitalized since last visit: No Patient Requires Transmission-Based No Implantable device outside of the clinic excluding No Precautions: cellular tissue based products placed in the center Patient Has Alerts: Yes since last visit: Patient Alerts: R ABI = Non compressible Has Dressing in Place as Prescribed: Yes L ABI = Non Pain Present Now: No compressible Electronic Signature(s) Signed: 10/18/2020 4:45:58 PM By: [name] Hammock RN Entered By: [name] on 10/18/2020 11:26:18 -------------------------------------------------------------------------------- Clinic Level of Care Assessment Details Patient Name: Date of Service: [name], [name] 10/18/2020 11:00 A M Medical Record Number: [id number] Patient Account Number: [id number] Date of Birth/Sex: Treating RN: July 08, 1925 (87 y.o. [name] Primary Care [name]: [name] Other Clinician: Referring [name]: Treating [name]/Extender: Thayer Headings in  Treatment: 18 Clinic Level of Care Assessment Items TOOL 4 Quantity Score X- 1 0 Use when only an EandM is performed on FOLLOW-UP visit ASSESSMENTS - Nursing Assessment / Reassessment X- 1 10 Reassessment of Co-morbidities (includes updates in patient status) X- 1 5 Reassessment of Adherence to Treatment Plan ASSESSMENTS - Wound and Skin A ssessment / Reassessment []  - 0 Simple Wound Assessment / Reassessment - one wound X- 4 5 Complex Wound Assessment / Reassessment - multiple wounds []  - 0 Dermatologic / Skin Assessment (not related to wound area) ASSESSMENTS - Focused Assessment []  - 0 Circumferential Edema Measurements - multi extremities []  - 0 Nutritional Assessment / Counseling / Intervention []  - 0 Lower Extremity Assessment (monofilament, tuning fork, pulses) []  - 0 Peripheral Arterial Disease Assessment (using hand held doppler) ASSESSMENTS - Ostomy and/or Continence Assessment and Care []  - 0 Incontinence Assessment and Management []  - 0 Ostomy Care Assessment and Management (repouching, etc.) PROCESS - Coordination of Care []  - 0 Simple Patient / Family Education for ongoing care X- 1 20 Complex (extensive) Patient / Family Education for ongoing care []  - 0 Staff obtains Programmer, systems, Records, T Results / Process Orders est []  - 0 Staff telephones HHA, Nursing Homes / Clarify orders / etc []  - 0 Routine Transfer to another Facility (non-emergent condition) []  - 0 Routine Hospital Admission (non-emergent condition) []  - 0 New Admissions / Biomedical engineer / Ordering NPWT Apligraf, etc. , []  - 0 Emergency Hospital Admission (emergent condition) []  - 0 Simple Discharge Coordination []  - 0 Complex (extensive) Discharge Coordination PROCESS - Special Needs []  - 0 Pediatric / Minor Patient Management []  - 0 Isolation Patient Management []  - 0 Hearing / Language / Visual special needs []  - 0 Assessment of Community assistance (transportation, D/C  planning, etc.) []  - 0 Additional assistance / Altered mentation []  - 0 Support Surface(s) Assessment (bed, cushion, seat, etc.) INTERVENTIONS - Wound Cleansing / Measurement []  - 0  Simple Wound Cleansing - one wound X- 4 5 Complex Wound Cleansing - multiple wounds []  - 0 Wound Imaging (photographs - any number of wounds) []  - 0 Wound Tracing (instead of photographs) []  - 0 Simple Wound Measurement - one wound X- 4 5 Complex Wound Measurement - multiple wounds INTERVENTIONS - Wound Dressings []  - 0 Small Wound Dressing one or multiple wounds []  - 0 Medium Wound Dressing one or multiple wounds X- 2 20 Large Wound Dressing one or multiple wounds []  - 0 Application of Medications - topical []  - 0 Application of Medications - injection INTERVENTIONS - Miscellaneous []  - 0 External ear exam []  - 0 Specimen Collection (cultures, biopsies, blood, body fluids, etc.) []  - 0 Specimen(s) / Culture(s) sent or taken to Lab for analysis []  - 0 Patient Transfer (multiple staff / Civil Service fast streamer / Similar devices) []  - 0 Simple Staple / Suture removal (25 or less) []  - 0 Complex Staple / Suture removal (26 or more) []  - 0 Hypo / Hyperglycemic Management (close monitor of Blood Glucose) []  - 0 Ankle / Brachial Index (ABI) - do not check if billed separately X- 1 5 Vital Signs Has the patient been seen at the hospital within the last three years: Yes Total Score: 140 Level Of Care: New/Established - Level 4 Electronic Signature(s) Signed: 10/18/2020 6:20:31 PM By: [name] Entered By: [name] on 10/18/2020 12:04:18 -------------------------------------------------------------------------------- Encounter Discharge Information Details Patient Name: Date of Service: [name], [name] 10/18/2020 11:00 A M Medical Record Number: [id number] Patient Account Number: [id number] Date of Birth/Sex: Treating RN: 1925-04-25 (87 y.o. [name] Primary Care [name]:  [name] Other Clinician: Referring [name]: Treating [name]/Extender: Thayer Headings in Treatment: 18 Encounter Discharge Information Items Discharge Condition: Stable Ambulatory Status: Walker Discharge Destination: Home Transportation: Other Schedule Follow-up Appointment: Yes Clinical Summary of Care: Provided on 10/18/2020 Form Type Recipient Paper Patient Patient Electronic Signature(s) Signed: 10/18/2020 6:20:31 PM By: [name] Entered By: [name] on 10/18/2020 12:03:32 -------------------------------------------------------------------------------- Patient/Caregiver Education Details Patient Name: Date of Service: [name], Lalia A. 9/29/2022andnbsp11:00 A M Medical Record Number: [id number] Patient Account Number: [id number] Date of Birth/Gender: Treating RN: [birth year] (87 y.o. [name] Primary Care Physician: [name] Other Clinician: Referring Physician: Treating Physician/Extender: Thayer Headings in Treatment: 18 Education Assessment Education Provided To: Patient Education Topics Provided Venous: Methods: Explain/Verbal, Printed Responses: State content correctly Wound/Skin Impairment: Methods: Explain/Verbal, Printed Responses: State content correctly Electronic Signature(s) Signed: 10/18/2020 6:20:31 PM By: [name] Entered By: [name] on 10/18/2020 12:03:10 -------------------------------------------------------------------------------- Wound Assessment Details Patient Name: Date of Service: [name], [name]. 10/18/2020 11:00 A M Medical Record Number: [id number] Patient Account Number: [id number] Date of Birth/Sex: Treating RN: 02/09/1925 (87 y.o. [name], [name] Primary Care Nimo Verastegui: [name] Other Clinician: Referring Dasia Guerrier: Treating Quang Thorpe/Extender: Thayer Headings in Treatment:  18 Wound Status Wound Number: 10 Primary Etiology: Venous Leg Ulcer Wound Location: Left, Medial Lower Leg Wound Status: Open Wounding Event: Gradually Appeared Date Acquired: 10/11/2020 Weeks Of Treatment: 1 Clustered Wound: No Wound Measurements Length: (cm) 1 Width: (cm) 0.6 Depth: (cm) 0.1 Area: (cm) 0.471 Volume: (cm) 0.047 % Reduction in Area: 0% % Reduction in Volume: 0% Wound Description Classification: Full Thickness Without Exposed Support Struct Exudate Amount: Medium Exudate Type: Serosanguineous Exudate Color: red, brown ures Treatment Notes Wound #10 (Lower Leg) Wound Laterality: Left, Medial Cleanser Soap and Water Discharge  Instruction: May shower and wash wound with dial antibacterial soap and water prior to dressing change. Wound Cleanser Discharge Instruction: Cleanse the wound with wound cleanser prior to applying a clean dressing using gauze sponges, not tissue or cotton balls. Peri-Wound Care Triamcinolone 15 (g) Discharge Instruction: In clinic only. Use triamcinolone 15 (g) in clinic only. Sween Lotion (Moisturizing lotion) Discharge Instruction: Apply moisturizing lotion as directed Topical Primary Dressing KerraCel Ag Gelling Fiber Dressing, 4x5 in (silver alginate) Discharge Instruction: Apply silver alginate to wound bed as instructed Secondary Dressing Woven Gauze Sponge, Non-Sterile 4x4 in Discharge Instruction: Apply over primary dressing as directed. ABD Pad, 8x10 Discharge Instruction: Apply over primary dressing as directed. Secured With Compression Wrap Kerlix Roll 4.5x3.1 (in/yd) Discharge Instruction: Apply Kerlix and Coban compression as directed. Coban Self-Adherent Wrap 4x5 (in/yd) Discharge Instruction: Apply over Kerlix as directed. Compression Stockings Add-Ons Electronic Signature(s) Signed: 10/18/2020 4:45:58 PM By: [name] Hammock RN Entered By: [name] on 10/18/2020  11:27:02 -------------------------------------------------------------------------------- Wound Assessment Details Patient Name: Date of Service: [name], [place]. 10/18/2020 11:00 A M Medical Record Number: [id number] Patient Account Number: [id number] Date of Birth/Sex: Treating RN: [birth year] (87 y.o. [name], [name] Primary Care Tiffannie Sloss: [name] Other Clinician: Referring Cayde Held: Treating Zebulon Gantt/Extender: Thayer Headings in Treatment: 18 Wound Status Wound Number: 7R Primary Etiology: Malignant Wound Wound Location: Left, Anterior Lower Leg Wound Status: Open Wounding Event: Blister Date Acquired: 04/20/2020 Weeks Of Treatment: 18 Clustered Wound: No Wound Measurements Length: (cm) 3 Width: (cm) 2 Depth: (cm) 0.1 Area: (cm) 4.712 Volume: (cm) 0.471 % Reduction in Area: -53.4% % Reduction in Volume: -53.4% Wound Description Classification: Full Thickness Without Exposed Support Structu Exudate Amount: Medium Exudate Type: Serosanguineous Exudate Color: red, brown res Treatment Notes Wound #7R (Lower Leg) Wound Laterality: Left, Anterior Cleanser Soap and Water Discharge Instruction: May shower and wash wound with dial antibacterial soap and water prior to dressing change. Wound Cleanser Discharge Instruction: Cleanse the wound with wound cleanser prior to applying a clean dressing using gauze sponges, not tissue or cotton balls. Peri-Wound Care Triamcinolone 15 (g) Discharge Instruction: In clinic only. Use triamcinolone 15 (g) in clinic only. Sween Lotion (Moisturizing lotion) Discharge Instruction: Apply moisturizing lotion as directed Topical Primary Dressing KerraCel Ag Gelling Fiber Dressing, 4x5 in (silver alginate) Discharge Instruction: Apply silver alginate to wound bed as instructed Secondary Dressing Woven Gauze Sponge, Non-Sterile 4x4 in Discharge Instruction: Apply over primary dressing as  directed. ABD Pad, 8x10 Discharge Instruction: Apply over primary dressing as directed. Secured With Compression Wrap Kerlix Roll 4.5x3.1 (in/yd) Discharge Instruction: Apply Kerlix and Coban compression as directed. Coban Self-Adherent Wrap 4x5 (in/yd) Discharge Instruction: Apply over Kerlix as directed. Compression Stockings Add-Ons Electronic Signature(s) Signed: 10/18/2020 4:45:58 PM By: [name] Hammock RN Entered By: [name] on 10/18/2020 11:27:02 -------------------------------------------------------------------------------- Wound Assessment Details Patient Name: Date of Service: [name], [place]. 10/18/2020 11:00 A M Medical Record Number: [id number] Patient Account Number: [id number] Date of Birth/Sex: Treating RN: 15-Mar-1925 (87 y.o. [name], [name] Primary Care Devi Hopman: [name] Other Clinician: Referring Theophile Harvie: Treating Cirilo Canner/Extender: Thayer Headings in Treatment: 18 Wound Status Wound Number: 8 Primary Etiology: Venous Leg Ulcer Wound Location: Right, Lateral Lower Leg Wound Status: Open Wounding Event: Gradually Appeared Date Acquired: 09/27/2020 Weeks Of Treatment: 3 Clustered Wound: No Wound Measurements Length: (cm) 0.7 Width: (cm) 0.5 Depth: (cm) 0.1 Area: (cm) 0.275 Volume: (cm) 0.027 % Reduction in Area: 41.6% % Reduction in Volume:  42.6% Wound Description Classification: Full Thickness Without Exposed Support Structu Exudate Amount: Medium Exudate Type: Serosanguineous Exudate Color: red, brown res Treatment Notes Wound #8 (Lower Leg) Wound Laterality: Right, Lateral Cleanser Soap and Water Discharge Instruction: May shower and wash wound with dial antibacterial soap and water prior to dressing change. Wound Cleanser Discharge Instruction: Cleanse the wound with wound cleanser prior to applying a clean dressing using gauze sponges, not tissue or cotton balls. Peri-Wound  Care Triamcinolone 15 (g) Discharge Instruction: In clinic only. Use triamcinolone 15 (g) in clinic only. Sween Lotion (Moisturizing lotion) Discharge Instruction: Apply moisturizing lotion as directed Topical Primary Dressing KerraCel Ag Gelling Fiber Dressing, 4x5 in (silver alginate) Discharge Instruction: Apply silver alginate to wound bed as instructed Secondary Dressing Woven Gauze Sponge, Non-Sterile 4x4 in Discharge Instruction: Apply over primary dressing as directed. ABD Pad, 8x10 Discharge Instruction: Apply over primary dressing as directed. Secured With Compression Wrap Kerlix Roll 4.5x3.1 (in/yd) Discharge Instruction: Apply Kerlix and Coban compression as directed. Coban Self-Adherent Wrap 4x5 (in/yd) Discharge Instruction: Apply over Kerlix as directed. Compression Stockings Add-Ons Electronic Signature(s) Signed: 10/18/2020 4:45:58 PM By: [name] Hammock RN Entered By: [name] on 10/18/2020 11:27:03 -------------------------------------------------------------------------------- Wound Assessment Details Patient Name: Date of Service: [name], [name]. 10/18/2020 11:00 A M Medical Record Number: [id number] Patient Account Number: [id number] Date of Birth/Sex: Treating RN: 1925-09-22 (87 y.o. [name], [name] Primary Care Cole Klugh: [name] Other Clinician: Referring Niamya Vittitow: Treating Stepen Prins/Extender: Thayer Headings in Treatment: 18 Wound Status Wound Number: 9 Primary Etiology: Venous Leg Ulcer Wound Location: Left, Posterior Lower Leg Wound Status: Open Wounding Event: Gradually Appeared Date Acquired: 10/04/2020 Weeks Of Treatment: 2 Clustered Wound: No Wound Measurements Length: (cm) 2 Width: (cm) 0.5 Depth: (cm) 0.1 Area: (cm) 0.785 Volume: (cm) 0.079 % Reduction in Area: -317.6% % Reduction in Volume: -315.8% Wound Description Classification: Full Thickness Without Exposed Support  Stru Exudate Amount: Medium Exudate Type: Serosanguineous Exudate Color: red, brown Treatment Notes Wound #9 (Lower Leg) Wound Laterality: Left, Posterior Cleanser Soap and Water Discharge Instruction: May shower and wash wound wit Wound Cleanser Discharge Instruction: Cleanse the wound with wound balls. ctures h dial antibacterial soap and water prior to dressing change. cleanser prior to applying a clean dressing using gauze sponges, not tissue or cotton Peri-Wound Care Triamcinolone 15 (g) Discharge Instruction: In clinic only. Use triamcinolone 15 (g) in clinic only. Sween Lotion (Moisturizing lotion) Discharge Instruction: Apply moisturizing lotion as directed Topical Primary Dressing KerraCel Ag Gelling Fiber Dressing, 4x5 in (silver alginate) Discharge Instruction: Apply silver alginate to wound bed as instructed Secondary Dressing Woven Gauze Sponge, Non-Sterile 4x4 in Discharge Instruction: Apply over primary dressing as directed. ABD Pad, 8x10 Discharge Instruction: Apply over primary dressing as directed. Secured With Compression Wrap Kerlix Roll 4.5x3.1 (in/yd) Discharge Instruction: Apply Kerlix and Coban compression as directed. Coban Self-Adherent Wrap 4x5 (in/yd) Discharge Instruction: Apply over Kerlix as directed. Compression Stockings Add-Ons Electronic Signature(s) Signed: 10/18/2020 4:45:58 PM By: [name] Hammock RN Entered By: [name] on 10/18/2020 11:27:03 -------------------------------------------------------------------------------- Vitals Details Patient Name: Date of Service: [name], [name]. 10/18/2020 11:00 A M Medical Record Number: [id number] Patient Account Number: [id number] Date of Birth/Sex: Treating RN: 1925-07-14 (87 y.o. [name], [name] Primary Care Jiya Kissinger: [name] Other Clinician: Referring Solveig Fangman: Treating Esequiel Kleinfelter/Extender: Thayer Headings in Treatment: 18 Vital  Signs Time Taken: 11:26 Temperature (F): 98.6 Height (in): 65 Pulse (bpm): 61 Weight (lbs): 163 Respiratory Rate (breaths/min):  18 Body Mass Index (BMI): 27.1 Blood Pressure (mmHg): 129/72 Reference Range: 80 - 120 mg / dl Electronic Signature(s) Signed: 10/18/2020 4:45:58 PM By: [name] Hammock RN Entered By: [name] on 10/18/2020 11:26:37

## 2020-10-19 NOTE — Progress Notes (Signed)
[name] ([id number]) , Visit Report for 10/18/2020 SuperBill Details Patient Name: Date of Service: [name], [place]. 10/18/2020 Medical Record Number: [id number] Patient Account Number: [id number] Date of Birth/Sex: Treating RN: [birth year] (87 y.o. 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

## 2020-10-22 DIAGNOSIS — R531 Weakness: Secondary | ICD-10-CM | POA: Diagnosis not present

## 2020-10-22 DIAGNOSIS — R5081 Fever presenting with conditions classified elsewhere: Secondary | ICD-10-CM | POA: Diagnosis not present

## 2020-10-22 DIAGNOSIS — U071 COVID-19: Secondary | ICD-10-CM | POA: Diagnosis not present

## 2020-10-22 DIAGNOSIS — Z1152 Encounter for screening for COVID-19: Secondary | ICD-10-CM | POA: Diagnosis not present

## 2020-10-22 DIAGNOSIS — R051 Acute cough: Secondary | ICD-10-CM | POA: Diagnosis not present

## 2020-10-22 DIAGNOSIS — I517 Cardiomegaly: Secondary | ICD-10-CM | POA: Diagnosis not present

## 2020-10-23 DIAGNOSIS — R051 Acute cough: Secondary | ICD-10-CM | POA: Diagnosis not present

## 2020-10-23 DIAGNOSIS — R531 Weakness: Secondary | ICD-10-CM | POA: Diagnosis not present

## 2020-10-23 DIAGNOSIS — R5081 Fever presenting with conditions classified elsewhere: Secondary | ICD-10-CM | POA: Diagnosis not present

## 2020-10-23 DIAGNOSIS — N390 Urinary tract infection, site not specified: Secondary | ICD-10-CM | POA: Diagnosis not present

## 2020-10-23 DIAGNOSIS — U071 COVID-19: Secondary | ICD-10-CM | POA: Diagnosis not present

## 2020-10-23 DIAGNOSIS — R21 Rash and other nonspecific skin eruption: Secondary | ICD-10-CM | POA: Diagnosis not present

## 2020-10-25 ENCOUNTER — Encounter (HOSPITAL_BASED_OUTPATIENT_CLINIC_OR_DEPARTMENT_OTHER): Payer: Medicare Other | Admitting: Internal Medicine

## 2020-10-25 NOTE — Telephone Encounter (Signed)
Pt ready for scheduling on or after 05/28/20  Out-of-pocket cost due at time of visit: $0.00  Primary: Medicare [place] co-insurance: 20% (approximately $255) Admin fee co-insurance: 20% (approximately $25)  Secondary: [employer] [place] co-insurance: Covers Medicare Part B co-insurance and deductible.  Admin fee co-insurance: Covers Medicare Part B co-insurance and deductible.   Deductible: $233 of $233 met  Prior Auth: not required PA# Valid:   ** This summary of benefits is an estimation of the patient's out-of-pocket cost. Exact cost may vary based on individual plan coverage.

## 2020-10-31 ENCOUNTER — Encounter: Payer: Self-pay | Admitting: Internal Medicine

## 2020-10-31 ENCOUNTER — Other Ambulatory Visit: Payer: Self-pay

## 2020-10-31 ENCOUNTER — Ambulatory Visit (INDEPENDENT_AMBULATORY_CARE_PROVIDER_SITE_OTHER): Payer: Medicare Other | Admitting: Internal Medicine

## 2020-10-31 DIAGNOSIS — C50412 Malignant neoplasm of upper-outer quadrant of left female breast: Secondary | ICD-10-CM | POA: Diagnosis not present

## 2020-10-31 DIAGNOSIS — R21 Rash and other nonspecific skin eruption: Secondary | ICD-10-CM | POA: Diagnosis not present

## 2020-10-31 DIAGNOSIS — R609 Edema, unspecified: Secondary | ICD-10-CM | POA: Diagnosis not present

## 2020-10-31 DIAGNOSIS — Z170 Estrogen receptor positive status [ER+]: Secondary | ICD-10-CM | POA: Diagnosis not present

## 2020-10-31 DIAGNOSIS — M545 Low back pain, unspecified: Secondary | ICD-10-CM | POA: Diagnosis not present

## 2020-10-31 DIAGNOSIS — R634 Abnormal weight loss: Secondary | ICD-10-CM | POA: Diagnosis not present

## 2020-10-31 DIAGNOSIS — U071 COVID-19: Secondary | ICD-10-CM | POA: Insufficient documentation

## 2020-10-31 NOTE — Assessment & Plan Note (Signed)
Use a walker On Gabapentin

## 2020-10-31 NOTE — Patient Instructions (Signed)
Wt Readings from Last 3 Encounters:  10/31/20 149 lb 2 oz (67.6 kg)  10/17/20 135 lb 4 oz (61.3 kg)  09/26/20 153 lb 14.4 oz (69.8 kg)

## 2020-10-31 NOTE — Assessment & Plan Note (Signed)
Wt Readings from Last 3 Encounters:  10/31/20 149 lb 2 oz (67.6 kg)  10/17/20 135 lb 4 oz (61.3 kg)  09/26/20 153 lb 14.4 oz (69.8 kg)  Cont w/Ensure bid

## 2020-10-31 NOTE — Assessment & Plan Note (Signed)
Recent - all over. 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 Likely allergic rash Medrol pack offered - declined.  Clobetasol lotion offered - declined

## 2020-10-31 NOTE — Progress Notes (Signed)
Subjective:  Patient ID: [name], female    DOB: [birth year]  Age: 87 y.o. MRN: [id number]  CC: Follow-up (4 month f/u)   HPI [name] presents for COVID last week  -- achy, fatigue, cough. Pt took an antiviral... Feeling better  C/o rash. 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  Outpatient Medications Prior to Visit  Medication Sig Dispense Refill   acetaminophen (TYLENOL) 500 MG tablet Take 500 mg by mouth daily as needed for mild pain.      apixaban (ELIQUIS) 5 MG TABS tablet Take 1 tablet (5 mg total) by mouth 2 (two) times daily. 180 tablet 3   B Complex-C (B-COMPLEX WITH VITAMIN C) tablet Take 1 tablet by mouth daily.     busPIRone (BUSPAR) 15 MG tablet Take 1 tablet (15 mg total) by mouth 2 (two) times daily. 180 tablet 11   Cholecalciferol (VITAMIN D3) 1000 UNITS tablet Take 1,000 Units by mouth daily.     colestipol (COLESTID) 1 g tablet Take 1 tablet (1 g total) by mouth in the morning. 60 tablet 12   diltiazem (CARDIZEM CD) 120 MG 24 hr capsule TAKE 1 CAPSULE (120 MG TOTAL) BY MOUTH DAILY. 90 capsule 3   doxycycline (VIBRA-TABS) 100 MG tablet Take 1 tablet (100 mg total) by mouth daily. 90 tablet 3   famotidine (PEPCID) 20 MG tablet Take 20 mg by mouth daily.     furosemide (LASIX) 40 MG tablet Take 1 tablet (40 mg total) by mouth daily as needed. 30 tablet 5   gabapentin (NEURONTIN) 100 MG capsule Take 1 capsule (100 mg total) by mouth at bedtime. Pain 90 capsule 3   levothyroxine (SYNTHROID) 25 MCG tablet TAKE 1 TABLET BY MOUTH DAILY FOR THYROID. 30 tablet 11   loperamide (IMODIUM) 2 MG capsule Take 1 capsule (2 mg total) by mouth as needed for diarrhea or loose stools. Take 1 tab with first loose stool then one tab after each loose stool for maximum dose of 8 tabs daily. Pt to call the office if need for greater then 8 tabs a day 60 capsule 0   metoprolol succinate (TOPROL-XL) 50 MG 24 hr tablet TAKE 1/2  TABLET = 25 MG TWICE DAILY. TAKE WITH OR IMMEDIATELY FOLLOWING A MEAL 30 tablet 11   Multiple Vitamins-Minerals (MULTIVITAMIN WITH MINERALS) tablet Take 1 tablet by mouth daily.     polyethylene glycol (MIRALAX) 17 g packet Take 17 g by mouth daily. 30 each 0   prochlorperazine (COMPAZINE) 5 MG tablet Take 1 tablet (5 mg total) by mouth every 8 (eight) hours as needed for nausea or vomiting. 20 tablet 1   temazepam (RESTORIL) 15 MG capsule TAKE 1 CAPSULE BY MOUTH EVERY NIGHT AT BEDTIME AS NEEDED FOR SLEEP 60 capsule 3   No facility-administered medications prior to visit.    ROS: Review of Systems  Constitutional:  Positive for fatigue. Negative for activity change, appetite change, chills and unexpected weight change.  HENT:  Negative for congestion, mouth sores and sinus pressure.   Eyes:  Negative for visual disturbance.  Respiratory:  Negative for cough and chest tightness.   Gastrointestinal:  Negative for abdominal pain and nausea.  Genitourinary:  Negative for difficulty urinating, frequency and vaginal pain.  Musculoskeletal:  Positive for arthralgias and back pain. Negative for gait problem.  Skin:  Negative for pallor and rash.  Neurological:  Positive for weakness. Negative for dizziness, tremors, numbness and headaches.  Psychiatric/Behavioral:  Negative for confusion, sleep disturbance and suicidal ideas. The patient is nervous/anxious.    Objective:  BP 122/62 (BP Location: Left Arm)   Pulse 60   Temp 98.1 F (36.7 C) (Oral)   Wt 149 lb 2 oz (67.6 kg)   SpO2 97%   BMI 24.82 kg/m   BP Readings from Last 3 Encounters:  10/31/20 122/62  10/17/20 (!) 159/95  09/26/20 131/63    Wt Readings from Last 3 Encounters:  10/31/20 149 lb 2 oz (67.6 kg)  10/17/20 135 lb 4 oz (61.3 kg)  09/26/20 153 lb 14.4 oz (69.8 kg)    Physical Exam Constitutional:      General: She is not in acute distress.    Appearance: She is well-developed. She is not toxic-appearing or  diaphoretic.  HENT:     Head: Normocephalic.     Right Ear: External ear normal.     Left Ear: External ear normal.     Nose: Nose normal.  Eyes:     General:        Right eye: No discharge.        Left eye: No discharge.     Conjunctiva/sclera: Conjunctivae normal.     Pupils: Pupils are equal, round, and reactive to light.  Neck:     Thyroid: No thyromegaly.     Vascular: No JVD.     Trachea: No tracheal deviation.  Cardiovascular:     Rate and Rhythm: Normal rate and regular rhythm.     Heart sounds: Normal heart sounds.  Pulmonary:     Effort: No respiratory distress.     Breath sounds: No stridor. No wheezing.  Abdominal:     General: Bowel sounds are normal. There is no distension.     Palpations: Abdomen is soft. There is no mass.     Tenderness: There is no abdominal tenderness. There is no guarding or rebound.  Musculoskeletal:        General: Tenderness present.     Cervical back: Normal range of motion and neck supple.  Lymphadenopathy:     Cervical: No cervical adenopathy.  Skin:    Findings: No erythema or rash.  Neurological:     Cranial Nerves: No cranial nerve deficit.     Motor: Weakness present. No abnormal muscle tone.     Coordination: Coordination normal.     Gait: Gait abnormal.     Deep Tendon Reflexes: Reflexes normal.  Psychiatric:        Behavior: Behavior normal.        Thought Content: Thought content normal.        Judgment: Judgment normal.  Looks tired Using a walker Dry papular rash - trunk and extr x4  Lab Results  Component Value Date   WBC 5.8 10/17/2020   HGB 12.1 10/17/2020   HCT 36.5 10/17/2020   PLT 217 10/17/2020   GLUCOSE 114 (H) 10/17/2020   CHOL 116 06/25/2012   TRIG 82 09/05/2013   HDL 41.50 06/25/2012   LDLDIRECT 110.3 05/24/2008   LDLCALC 66 06/25/2012   ALT 14 10/17/2020   AST 21 10/17/2020   NA 140 10/17/2020   K 3.8 10/17/2020   CL 102 10/17/2020   CREATININE 1.19 (H) 10/17/2020   BUN 16 10/17/2020    CO2 27 10/17/2020   TSH 0.679 10/17/2020   INR 1.33 05/23/2017   HGBA1C 5.9 (H) 10/07/2018    VAS [place] ABI WITH/WO TBI  Result Date: 06/14/2020  LOWER EXTREMITY  DOPPLER STUDY Patient Name:  [name]  Date of Exam:   06/14/2020 Medical Rec #: [id number]      Accession #:    [id number] Date of Birth: [birth year]     Patient Gender: F Patient Age:   89Y Exam Location:  [facility] Procedure:      VAS [place] ABI WITH/WO TBI Referring Phys: [id number] JESSICA RATLIFF HOFFMAN --------------------------------------------------------------------------------  Indications: Ulceration. High Risk Factors: Hypertension, no history of smoking.  Vascular Interventions: Chronic venous insufficiency. Limitations: Today's exam was limited due to bandages and an open wound. Performing Technologist: Delorise Shiner RVT  Examination Guidelines: A complete evaluation includes at minimum, Doppler waveform signals and systolic blood pressure reading at the level of bilateral brachial, anterior tibial, and posterior tibial arteries, when vessel segments are accessible. Bilateral testing is considered an integral part of a complete examination. Photoelectric Plethysmograph (PPG) waveforms and toe systolic pressure readings are included as required and additional duplex testing as needed. Limited examinations for reoccurring indications may be performed as noted.  ABI Findings: +---------+------------------+-----+---------+--------+ Right    Rt Pressure (mmHg)IndexWaveform Comment  +---------+------------------+-----+---------+--------+ Brachial 165                                      +---------+------------------+-----+---------+--------+ ATA      215               1.30                   +---------+------------------+-----+---------+--------+ PTA      191               1.16 triphasic         +---------+------------------+-----+---------+--------+ DP                              biphasic           +---------+------------------+-----+---------+--------+ Great Toe135               0.82                   +---------+------------------+-----+---------+--------+ +---------+------------------+-----+----------+-------+ Left     Lt Pressure (mmHg)IndexWaveform  Comment +---------+------------------+-----+----------+-------+ Brachial 164                                      +---------+------------------+-----+----------+-------+ ATA      187               1.13                   +---------+------------------+-----+----------+-------+ PTA      204               1.24 triphasic         +---------+------------------+-----+----------+-------+ DP                              monophasic        +---------+------------------+-----+----------+-------+ Great Toe160               0.97                   +---------+------------------+-----+----------+-------+ +-------+-----------+-----------+------------+------------+ ABI/TBIToday's ABIToday's TBIPrevious ABIPrevious TBI +-------+-----------+-----------+------------+------------+ Right  1.30  0.82                                +-------+-----------+-----------+------------+------------+ Left   1.24       0.97                                +-------+-----------+-----------+------------+------------+   Summary: Right: Resting right ankle-brachial index is within normal range. No evidence of significant right lower extremity arterial disease. The right toe-brachial index is normal. RT great toe pressure = 135 mmHg. Left: Resting left ankle-brachial index is within normal range. No evidence of significant left lower extremity arterial disease. The left toe-brachial index is normal. LT Great toe pressure = 160 mmHg.  *See table(s) above for measurements and observations.  Electronically signed by Deitra Mayo MD on 06/14/2020 at 4:34:25 PM.    Final     Assessment & Plan:   Problem List Items Addressed This  Visit     Malignant neoplasm of upper-outer quadrant of left breast in female, estrogen receptor positive ([place]) (Chronic)    She is on estrogen receptor positive invasive breast cancer treatment with cemiplimab/ Libtayo IV.      COVID-19    Getting better - cont w/PO hydratiion      Edema    Ok to re-start Furosemide      Low back pain    Use a walker On Gabapentin      Rash    Recent - all over. 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 Likely allergic rash Medrol pack offered - declined.  Clobetasol lotion offered - declined      Weight loss    Wt Readings from Last 3 Encounters:  10/31/20 149 lb 2 oz (67.6 kg)  10/17/20 135 lb 4 oz (61.3 kg)  09/26/20 153 lb 14.4 oz (69.8 kg)  Cont w/Ensure bid          Follow-up: Return in about 3 months (around 01/31/2021) for a follow-up visit.  Walker Kehr, MD

## 2020-10-31 NOTE — Assessment & Plan Note (Signed)
Getting better - cont w/PO hydratiion

## 2020-10-31 NOTE — Assessment & Plan Note (Signed)
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

## 2020-10-31 NOTE — Assessment & Plan Note (Signed)
Ok to re-start Furosemide

## 2020-11-01 ENCOUNTER — Encounter (HOSPITAL_BASED_OUTPATIENT_CLINIC_OR_DEPARTMENT_OTHER): Payer: Medicare Other | Attending: Internal Medicine | Admitting: Internal Medicine

## 2020-11-01 DIAGNOSIS — Z8616 Personal history of COVID-19: Secondary | ICD-10-CM | POA: Insufficient documentation

## 2020-11-01 DIAGNOSIS — L97829 Non-pressure chronic ulcer of other part of left lower leg with unspecified severity: Secondary | ICD-10-CM | POA: Diagnosis not present

## 2020-11-01 DIAGNOSIS — L97822 Non-pressure chronic ulcer of other part of left lower leg with fat layer exposed: Secondary | ICD-10-CM | POA: Diagnosis not present

## 2020-11-01 DIAGNOSIS — I872 Venous insufficiency (chronic) (peripheral): Secondary | ICD-10-CM | POA: Insufficient documentation

## 2020-11-01 DIAGNOSIS — L97819 Non-pressure chronic ulcer of other part of right lower leg with unspecified severity: Secondary | ICD-10-CM | POA: Diagnosis not present

## 2020-11-01 DIAGNOSIS — L97812 Non-pressure chronic ulcer of other part of right lower leg with fat layer exposed: Secondary | ICD-10-CM | POA: Diagnosis not present

## 2020-11-01 DIAGNOSIS — Z853 Personal history of malignant neoplasm of breast: Secondary | ICD-10-CM | POA: Diagnosis not present

## 2020-11-01 DIAGNOSIS — Z85828 Personal history of other malignant neoplasm of skin: Secondary | ICD-10-CM | POA: Diagnosis not present

## 2020-11-01 DIAGNOSIS — L97222 Non-pressure chronic ulcer of left calf with fat layer exposed: Secondary | ICD-10-CM | POA: Diagnosis not present

## 2020-11-01 NOTE — Progress Notes (Signed)
[name] ([id number]) , Visit Report for 11/01/2020 HPI Details Patient Name: Date of Service: [name], [place]. 11/01/2020 1:30 PM Medical Record Number: [id number] Patient Account Number: [id number] Date of Birth/Sex: Treating RN: [birth year] (87 y.o. [name] Primary Care Provider: [name] Other Clinician: Referring Provider: Treating Provider/Extender: Thayer Headings in Treatment: 20 History of Present Illness Location: left leg HPI Description: Admission 5/23 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 She states that the wound on the right has been present for a year. The 1 on the left has opened up 1 month ago. She is followed with oncology for this issue as she had biopsies that showed squamous cell carcinoma. She is also seeing radiation oncology for treatment options. She presents today because she would like for her wounds to be healed by [place]. She currently denies signs of infection. 6/1; patient presents for 1 week follow-up. She states she has tolerated the leg wraps well. She states these do not bother her and is happy to continue with them. She is scheduled to see her oncologist today to go over treatment options for the bilateral lower extremity squamous cell carcinoma. Radiation is currently not a recommended option. Patient states she overall feels well. 6/22; patient presents for 3-week follow-up. She has tolerated the wraps well until her last wrap where she states they were uncomfortable. She attributes this to the home health nurse. She denies signs of infection. She has started her first treatment of antibody infusions for her Bilateral lower extremity squamous cell carcinoma. She has no  complaints today. 7/21; patient presents for 1 month follow-up. Unfortunately she has not had good experience with her wrap changes with home health. She would like to do her own dressing changes. She continues to do her antibody infusions. She denies signs of infection. 7/28; patient presents for 1 week follow-up. At last clinic visit she was switched to daily dressing changes due to issues with the wrap and home health placing them. Unfortunately she has developed weeping to her legs bilaterally. She would like to be placed in wraps today. She would also like to follow with [place] weekly for wrap changes instead of having home health change them. She denies signs of infection. 8/4; patient presents for 1 week follow-up. She has tolerated the Kerlix/Coban wraps well. She no longer has weeping to her legs. She took the wrap off 1 day before coming in to be able to take a shower. She has no issues or complaints today. She denies signs of infection. 8/18; patient presents for follow-up. Patient has tolerated the wraps well. She brought her Velcro compression wraps today. She has no issues or complaints today. She had her chemotherapy infusion yesterday without issues. She denies signs of infection. 8/25; patient presents for follow-up. She used her juxta lite compressions for the past week. It is unclear if she is able to put these on correctly since she states she has a hard time getting them to look right. She reports 2 open wounds. She currently denies signs of infection. 9/1; patient presents for 1 week follow-up. She has 1 open wound. She tolerated the compression wraps well. She currently denies signs of infection. 9/8; patient presents for 1 week follow-up. She has 2 open wounds  1 on each leg. She has tolerated the compression wrap well. She currently denies signs of infection. 9/15; patient presents for 1 week follow-up. She now has 3 wounds. 2 on the left and 1 on the right. She continues to  tolerate the compression wrap well. She currently denies signs of infection. She has obtained furosemide by her primary care physician and would like to discuss when to take this. 9/22; patient presents for 1 week follow-up. She has scattered wounds on her lower extremities bilaterally. She did take furosemide twice in the past week. She does not recall having to urinate more frequently. She tolerated the 3 layer compression well. She denies signs of infection. 10/13; patient has not been here recently because of [place]. Apparently the facility was only putting gauze on her legs. This is a patient I do not normally see. 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 Ronnald Ramp at [facility] dermatology. She is quite convincing that she did not have radiation to her lower legs. It is likely she also has significant chronic venous insufficiency stasis dermatitis. We have been using silver alginate under kerlix Coban. She has obvious open areas medially on the right and areas on the left. She also has areas of extensive dry flaking adherent areas on the right and to a lesser extent on the left anterior. Nodular areas on the left lateral lower leg left posterior calf and right mid calf medially. Electronic Signature(s) Signed: 11/01/2020 4:49:53 PM By: Linton Ham MD Entered By: Linton Ham on 11/01/2020 15:01:39 -------------------------------------------------------------------------------- Physical Exam Details Patient Name: Date of Service: [name], [place]. 11/01/2020 1:30 PM Medical Record Number: [id number] Patient Account Number: [id number] Date of Birth/Sex: Treating RN: 12/10/1925 (87 y.o. [name] Primary Care Provider: [name] Other Clinician: Referring Provider: Treating Provider/Extender: Thayer Headings in Treatment: 20 Constitutional Sitting or standing Blood  Pressure is within target range for patient.. Pulse regular and within target range for patient.[name] Respirations regular, non-labored and within target range.. Temperature is normal and within the target range for the patient.[name] Appears in no distress. Cardiovascular Pulses are palpable bilaterally. Significant nonpitting edema.. Notes Wound exam; I think the patient probably has chronic venous insufficiency and lymphedema accounting for at least some of how her legs present. However she also has dry hyperkeratotic areas extensively anteriorly right greater than left. Also flat nodules left lateral left posterior right mid calf. The open areas are mostly on the right lateral and areas on the left posterior and left anterior. I did not see any evidence of infection Electronic Signature(s) Signed: 11/01/2020 4:49:53 PM By: Linton Ham MD Entered By: Linton Ham on 11/01/2020 15:05:23 -------------------------------------------------------------------------------- Physician Orders Details Patient Name: Date of Service: [name], [place]. 11/01/2020 1:30 PM Medical Record Number: [id number] Patient Account Number: [id number] Date of Birth/Sex: Treating RN: 07/06/1925 (87 y.o. [name] Primary Care Provider: [name] Other Clinician: Referring Provider: Treating Provider/Extender: Thayer Headings in Treatment: 20 Verbal / Phone Orders: No Diagnosis Coding ICD-10 Coding Code Description C44.92 Squamous cell carcinoma of skin, unspecified L97.819 Non-pressure chronic ulcer of other part of right lower leg with unspecified severity L97.829 Non-pressure chronic ulcer of other part of left lower leg with unspecified severity I87.2 Venous insufficiency (chronic) (peripheral) Follow-up Appointments ppointment in 1 week. - with Dr. Heber [place] Return A Bathing/ Shower/ Hygiene May shower with protection but  do not get wound dressing(s) wet. - Use  cast protector Edema Control - Lymphedema / SCD / Other Elevate legs to the level of the heart or above for 30 minutes daily and/or when sitting, a frequency of: - elevate the legs throughout the day heart level if possible. Avoid standing for long periods of time. Exercise regularly Additional Orders / Instructions Follow Nutritious Diet Wound Treatment Wound #10 - Lower Leg Wound Laterality: Left, Medial, Distal Cleanser: Soap and Water 1 x Per Week/15 Days Discharge Instructions: May shower and wash wound with dial antibacterial soap and water prior to dressing change. Cleanser: Wound Cleanser 1 x Per Week/15 Days Discharge Instructions: Cleanse the wound with wound cleanser prior to applying a clean dressing using gauze sponges, not tissue or cotton balls. Peri-Wound Care: Triamcinolone 15 (g) 1 x Per Week/15 Days Discharge Instructions: In clinic only. Use triamcinolone 15 (g) in clinic only. Peri-Wound Care: Sween Lotion (Moisturizing lotion) 1 x Per Week/15 Days Discharge Instructions: Apply moisturizing lotion as directed Prim Dressing: KerraCel Ag Gelling Fiber Dressing, 4x5 in (silver alginate) 1 x Per Week/15 Days ary Discharge Instructions: Apply silver alginate to wound bed as instructed Secondary Dressing: Woven Gauze Sponge, Non-Sterile 4x4 in 1 x Per Week/15 Days Discharge Instructions: Apply over primary dressing as directed. Secondary Dressing: ABD Pad, 8x10 1 x Per Week/15 Days Discharge Instructions: Apply over primary dressing as directed. Compression Wrap: ThreePress (3 layer compression wrap) 1 x Per Week/15 Days Discharge Instructions: Apply three layer compression as directed. Wound #11 - Lower Leg Wound Laterality: Left, Medial, Proximal Cleanser: Soap and Water 1 x Per Week/15 Days Discharge Instructions: May shower and wash wound with dial antibacterial soap and water prior to dressing change. Cleanser: Wound Cleanser 1 x Per Week/15 Days Discharge  Instructions: Cleanse the wound with wound cleanser prior to applying a clean dressing using gauze sponges, not tissue or cotton balls. Peri-Wound Care: Triamcinolone 15 (g) 1 x Per Week/15 Days Discharge Instructions: In clinic only. Use triamcinolone 15 (g) in clinic only. Peri-Wound Care: Sween Lotion (Moisturizing lotion) 1 x Per Week/15 Days Discharge Instructions: Apply moisturizing lotion as directed Prim Dressing: KerraCel Ag Gelling Fiber Dressing, 4x5 in (silver alginate) 1 x Per Week/15 Days ary Discharge Instructions: Apply silver alginate to wound bed as instructed Secondary Dressing: Woven Gauze Sponge, Non-Sterile 4x4 in 1 x Per Week/15 Days Discharge Instructions: Apply over primary dressing as directed. Secondary Dressing: ABD Pad, 8x10 1 x Per Week/15 Days Discharge Instructions: Apply over primary dressing as directed. Compression Wrap: ThreePress (3 layer compression wrap) 1 x Per Week/15 Days Discharge Instructions: Apply three layer compression as directed. Wound #12 - Lower Leg Wound Laterality: Left, Lateral Cleanser: Soap and Water 1 x Per Week/15 Days Discharge Instructions: May shower and wash wound with dial antibacterial soap and water prior to dressing change. Cleanser: Wound Cleanser 1 x Per Week/15 Days Discharge Instructions: Cleanse the wound with wound cleanser prior to applying a clean dressing using gauze sponges, not tissue or cotton balls. Peri-Wound Care: Triamcinolone 15 (g) 1 x Per Week/15 Days Discharge Instructions: In clinic only. Use triamcinolone 15 (g) in clinic only. Peri-Wound Care: Sween Lotion (Moisturizing lotion) 1 x Per Week/15 Days Discharge Instructions: Apply moisturizing lotion as directed Prim Dressing: KerraCel Ag Gelling Fiber Dressing, 4x5 in (silver alginate) 1 x Per Week/15 Days ary Discharge Instructions: Apply silver alginate to wound bed as instructed Secondary Dressing: Woven Gauze Sponge, Non-Sterile 4x4 in 1 x Per  Week/15 Days Discharge  Instructions: Apply over primary dressing as directed. Secondary Dressing: ABD Pad, 8x10 1 x Per Week/15 Days Discharge Instructions: Apply over primary dressing as directed. Compression Wrap: ThreePress (3 layer compression wrap) 1 x Per Week/15 Days Discharge Instructions: Apply three layer compression as directed. Wound #13 - Lower Leg Wound Laterality: Right, Lateral, Proximal Cleanser: Soap and Water 1 x Per Week/15 Days Discharge Instructions: May shower and wash wound with dial antibacterial soap and water prior to dressing change. Cleanser: Wound Cleanser 1 x Per Week/15 Days Discharge Instructions: Cleanse the wound with wound cleanser prior to applying a clean dressing using gauze sponges, not tissue or cotton balls. Peri-Wound Care: Triamcinolone 15 (g) 1 x Per Week/15 Days Discharge Instructions: In clinic only. Use triamcinolone 15 (g) in clinic only. Peri-Wound Care: Sween Lotion (Moisturizing lotion) 1 x Per Week/15 Days Discharge Instructions: Apply moisturizing lotion as directed Prim Dressing: KerraCel Ag Gelling Fiber Dressing, 4x5 in (silver alginate) 1 x Per Week/15 Days ary Discharge Instructions: Apply silver alginate to wound bed as instructed Secondary Dressing: Woven Gauze Sponge, Non-Sterile 4x4 in 1 x Per Week/15 Days Discharge Instructions: Apply over primary dressing as directed. Secondary Dressing: ABD Pad, 8x10 1 x Per Week/15 Days Discharge Instructions: Apply over primary dressing as directed. Compression Wrap: ThreePress (3 layer compression wrap) 1 x Per Week/15 Days Discharge Instructions: Apply three layer compression as directed. Wound #14 - Lower Leg Wound Laterality: Right, Medial Cleanser: Soap and Water 1 x Per Week/15 Days Discharge Instructions: May shower and wash wound with dial antibacterial soap and water prior to dressing change. Cleanser: Wound Cleanser 1 x Per Week/15 Days Discharge Instructions: Cleanse the wound  with wound cleanser prior to applying a clean dressing using gauze sponges, not tissue or cotton balls. Peri-Wound Care: Triamcinolone 15 (g) 1 x Per Week/15 Days Discharge Instructions: In clinic only. Use triamcinolone 15 (g) in clinic only. Peri-Wound Care: Sween Lotion (Moisturizing lotion) 1 x Per Week/15 Days Discharge Instructions: Apply moisturizing lotion as directed Prim Dressing: KerraCel Ag Gelling Fiber Dressing, 4x5 in (silver alginate) 1 x Per Week/15 Days ary Discharge Instructions: Apply silver alginate to wound bed as instructed Secondary Dressing: Woven Gauze Sponge, Non-Sterile 4x4 in 1 x Per Week/15 Days Discharge Instructions: Apply over primary dressing as directed. Secondary Dressing: ABD Pad, 8x10 1 x Per Week/15 Days Discharge Instructions: Apply over primary dressing as directed. Compression Wrap: ThreePress (3 layer compression wrap) 1 x Per Week/15 Days Discharge Instructions: Apply three layer compression as directed. Wound #7R - Lower Leg Wound Laterality: Left, Anterior Cleanser: Soap and Water 1 x Per Week/15 Days Discharge Instructions: May shower and wash wound with dial antibacterial soap and water prior to dressing change. Cleanser: Wound Cleanser 1 x Per Week/15 Days Discharge Instructions: Cleanse the wound with wound cleanser prior to applying a clean dressing using gauze sponges, not tissue or cotton balls. Peri-Wound Care: Triamcinolone 15 (g) 1 x Per Week/15 Days Discharge Instructions: In clinic only. Use triamcinolone 15 (g) in clinic only. Peri-Wound Care: Sween Lotion (Moisturizing lotion) 1 x Per Week/15 Days Discharge Instructions: Apply moisturizing lotion as directed Prim Dressing: KerraCel Ag Gelling Fiber Dressing, 4x5 in (silver alginate) 1 x Per Week/15 Days ary Discharge Instructions: Apply silver alginate to wound bed as instructed Secondary Dressing: Woven Gauze Sponge, Non-Sterile 4x4 in 1 x Per Week/15 Days Discharge Instructions:  Apply over primary dressing as directed. Secondary Dressing: ABD Pad, 8x10 1 x Per Week/15 Days Discharge Instructions: Apply over primary dressing as  directed. Compression Wrap: ThreePress (3 layer compression wrap) 1 x Per Week/15 Days Discharge Instructions: Apply three layer compression as directed. Wound #8 - Lower Leg Wound Laterality: Right, Lateral Cleanser: Soap and Water 1 x Per Week/15 Days Discharge Instructions: May shower and wash wound with dial antibacterial soap and water prior to dressing change. Cleanser: Wound Cleanser 1 x Per Week/15 Days Discharge Instructions: Cleanse the wound with wound cleanser prior to applying a clean dressing using gauze sponges, not tissue or cotton balls. Peri-Wound Care: Triamcinolone 15 (g) 1 x Per Week/15 Days Discharge Instructions: In clinic only. Use triamcinolone 15 (g) in clinic only. Peri-Wound Care: Sween Lotion (Moisturizing lotion) 1 x Per Week/15 Days Discharge Instructions: Apply moisturizing lotion as directed Prim Dressing: KerraCel Ag Gelling Fiber Dressing, 4x5 in (silver alginate) 1 x Per Week/15 Days ary Discharge Instructions: Apply silver alginate to wound bed as instructed Secondary Dressing: Woven Gauze Sponge, Non-Sterile 4x4 in 1 x Per Week/15 Days Discharge Instructions: Apply over primary dressing as directed. Secondary Dressing: ABD Pad, 8x10 1 x Per Week/15 Days Discharge Instructions: Apply over primary dressing as directed. Compression Wrap: ThreePress (3 layer compression wrap) 1 x Per Week/15 Days Discharge Instructions: Apply three layer compression as directed. Wound #9 - Lower Leg Wound Laterality: Left, Posterior Cleanser: Soap and Water 1 x Per Week/15 Days Discharge Instructions: May shower and wash wound with dial antibacterial soap and water prior to dressing change. Cleanser: Wound Cleanser 1 x Per Week/15 Days Discharge Instructions: Cleanse the wound with wound cleanser prior to applying a clean  dressing using gauze sponges, not tissue or cotton balls. Peri-Wound Care: Triamcinolone 15 (g) 1 x Per Week/15 Days Discharge Instructions: In clinic only. Use triamcinolone 15 (g) in clinic only. Peri-Wound Care: Sween Lotion (Moisturizing lotion) 1 x Per Week/15 Days Discharge Instructions: Apply moisturizing lotion as directed Prim Dressing: KerraCel Ag Gelling Fiber Dressing, 4x5 in (silver alginate) 1 x Per Week/15 Days ary Discharge Instructions: Apply silver alginate to wound bed as instructed Secondary Dressing: Woven Gauze Sponge, Non-Sterile 4x4 in 1 x Per Week/15 Days Discharge Instructions: Apply over primary dressing as directed. Secondary Dressing: ABD Pad, 8x10 1 x Per Week/15 Days Discharge Instructions: Apply over primary dressing as directed. Compression Wrap: ThreePress (3 layer compression wrap) 1 x Per Week/15 Days Discharge Instructions: Apply three layer compression as directed. [facility] - Dr. Ronnald Ramp at [facility] for F/U of atypical lesions of bilateral lower legs Electronic Signature(s) Signed: 11/01/2020 4:48:42 PM By: Baruch Gouty RN, BSN Signed: 11/01/2020 4:49:53 PM By: Linton Ham MD Entered By: Baruch Gouty on 11/01/2020 14:32:44 Prescription 11/01/2020 Denk [facility] Lighter -------------------------------------------------------------------------------- Linton Ham MD Patient Name: Provider: April 20, 1925 [id number] Date of Birth: NPI#[name] [id number] Sex: DEA #: [id number] Phone #: License #: [place] Patient Address: [address] [place], [place] [address] [place], [place] [address] [phone] Allergies Zosyn; atorvastatin; atenolol; clarithromycin; codeine; Levaquin; Macrodantin; oxycodone Provider's Orders Dermatology - Dr. Ronnald Ramp at [facility] for F/U of atypical lesions of bilateral lower legs Hand  Signature: Date(s): Electronic Signature(s) Signed: 11/01/2020 4:48:42 PM By: Baruch Gouty RN, BSN Signed: 11/01/2020 4:49:53 PM By: Linton Ham MD Entered By: Baruch Gouty on 11/01/2020 14:32:46 -------------------------------------------------------------------------------- Problem List Details Patient Name: Date of Service: [name], [name] 11/01/2020 1:30 PM Medical Record Number: [id number] Patient Account Number: [id number] Date of Birth/Sex: Treating RN: 05/06/1925 (87 y.o. F) Baruch Gouty Primary  Care Provider: [name] Other Clinician: Referring Provider: Treating Provider/Extender: Thayer Headings in Treatment: 20 Active Problems ICD-10 Encounter Code Description Active Date MDM Diagnosis C44.92 Squamous cell carcinoma of skin, unspecified 06/11/2020 No Yes L97.819 Non-pressure chronic ulcer of other part of right lower leg with unspecified 06/11/2020 No Yes severity L97.829 Non-pressure chronic ulcer of other part of left lower leg with unspecified 06/11/2020 No Yes severity I87.2 Venous insufficiency (chronic) (peripheral) 06/11/2020 No Yes Inactive Problems Resolved Problems Electronic Signature(s) Signed: 11/01/2020 4:49:53 PM By: Linton Ham MD Entered By: Linton Ham on 11/01/2020 14:57:15 -------------------------------------------------------------------------------- Progress Note Details Patient Name: Date of Service: [name], [place]. 11/01/2020 1:30 PM Medical Record Number: [id number] Patient Account Number: [id number] Date of Birth/Sex: Treating RN: 15-Jun-1925 (87 y.o. [name] Primary Care Provider: [name] Other Clinician: Referring Provider: Treating Provider/Extender: Thayer Headings in Treatment: 20 Subjective History of Present Illness (HPI) The following HPI elements were documented for the patient's wound: Location: left leg Admission  5/23 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 She states that the wound on the right has been present for a year. The 1 on the left has opened up 1 month ago. She is followed with oncology for this issue as she had biopsies that showed squamous cell carcinoma. She is also seeing radiation oncology for treatment options. She presents today because she would like for her wounds to be healed by [place]. She currently denies signs of infection. 6/1; patient presents for 1 week follow-up. She states she has tolerated the leg wraps well. She states these do not bother her and is happy to continue with them. She is scheduled to see her oncologist today to go over treatment options for the bilateral lower extremity squamous cell carcinoma. Radiation is currently not a recommended option. Patient states she overall feels well. 6/22; patient presents for 3-week follow-up. She has tolerated the wraps well until her last wrap where she states they were uncomfortable. She attributes this to the home health nurse. She denies signs of infection. She has started her first treatment of antibody infusions for her Bilateral lower extremity squamous cell carcinoma. She has no complaints today. 7/21; patient presents for 1 month follow-up. Unfortunately she has not had good experience with her wrap changes with home health. She would like to do her own dressing changes. She continues to do her antibody infusions. She denies signs of infection. 7/28; patient presents for 1 week follow-up. At last clinic visit she was switched to daily dressing changes due to issues with the wrap and home health placing them. Unfortunately she has developed weeping to her legs bilaterally. She would like to be  placed in wraps today. She would also like to follow with [place] weekly for wrap changes instead of having home health change them. She denies signs of infection. 8/4; patient presents for 1 week follow-up. She has tolerated the Kerlix/Coban wraps well. She no longer has weeping to her legs. She took the wrap off 1 day before coming in to be able to take a shower. She has no issues or complaints today. She denies signs of infection. 8/18; patient presents for follow-up. Patient has tolerated the wraps well. She brought her Velcro compression wraps today.  She has no issues or complaints today. She had her chemotherapy infusion yesterday without issues. She denies signs of infection. 8/25; patient presents for follow-up. She used her juxta lite compressions for the past week. It is unclear if she is able to put these on correctly since she states she has a hard time getting them to look right. She reports 2 open wounds. She currently denies signs of infection. 9/1; patient presents for 1 week follow-up. She has 1 open wound. She tolerated the compression wraps well. She currently denies signs of infection. 9/8; patient presents for 1 week follow-up. She has 2 open wounds 1 on each leg. She has tolerated the compression wrap well. She currently denies signs of infection. 9/15; patient presents for 1 week follow-up. She now has 3 wounds. 2 on the left and 1 on the right. She continues to tolerate the compression wrap well. She currently denies signs of infection. She has obtained furosemide by her primary care physician and would like to discuss when to take this. 9/22; patient presents for 1 week follow-up. She has scattered wounds on her lower extremities bilaterally. She did take furosemide twice in the past week. She does not recall having to urinate more frequently. She tolerated the 3 layer compression well. She denies signs of infection. 10/13; patient has not been here recently because of [place].  Apparently the facility was only putting gauze on her legs. This is a patient I do not normally see. 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 Ronnald Ramp at [facility] dermatology. She is quite convincing that she did not have radiation to her lower legs. It is likely she also has significant chronic venous insufficiency stasis dermatitis. We have been using silver alginate under kerlix Coban. She has obvious open areas medially on the right and areas on the left. She also has areas of extensive dry flaking adherent areas on the right and to a lesser extent on the left anterior. Nodular areas on the left lateral lower leg left posterior calf and right mid calf medially. Objective Constitutional Sitting or standing Blood Pressure is within target range for patient.. Pulse regular and within target range for patient.[name] Respirations regular, non-labored and within target range.. Temperature is normal and within the target range for the patient.[name] Appears in no distress. Vitals Time Taken: 1:44 PM, Height: 65 in, Source: Stated, Weight: 163 lbs, Source: Stated, BMI: 27.1, Temperature: 98.4 F, Pulse: 73 bpm, Respiratory Rate: 18 breaths/min, Blood Pressure: 116/68 mmHg. Cardiovascular Pulses are palpable bilaterally. Significant nonpitting edema.. General Notes: Wound exam; I think the patient probably has chronic venous insufficiency and lymphedema accounting for at least some of how her legs present. However she also has dry hyperkeratotic areas extensively anteriorly right greater than left. Also flat nodules left lateral left posterior right mid calf. The open areas are mostly on the right lateral and areas on the left posterior and left anterior. I did not see any evidence of infection Integumentary (Hair, Skin) Wound #10 status is Open. Original cause of wound was Gradually Appeared. The date acquired was: 10/11/2020. The  wound has been in treatment 3 weeks. The wound is located on the Left,Distal,Medial Lower Leg. The wound measures 4.5cm length x 4.5cm width x 0.1cm depth; 15.904cm^2 area and 1.59cm^3 volume. There is Fat Layer (Subcutaneous Tissue) exposed. There is no tunneling or undermining noted. There is a medium amount of serous drainage noted. The  wound margin is flat and intact. There is large (67-100%) red granulation within the wound bed. There is no necrotic tissue within the wound bed. Wound #11 status is Open. Original cause of wound was Gradually Appeared. The date acquired was: 11/01/2020. The wound is located on the Left,Proximal,Medial Lower Leg. The wound measures 8.4cm length x 5cm width x 0.1cm depth; 32.987cm^2 area and 3.299cm^3 volume. There is Fat Layer (Subcutaneous Tissue) exposed. There is no tunneling or undermining noted. There is a medium amount of serous drainage noted. The wound margin is flat and intact. There is large (67-100%) red granulation within the wound bed. There is no necrotic tissue within the wound bed. Wound #12 status is Open. Original cause of wound was Gradually Appeared. The date acquired was: 11/01/2020. The wound is located on the Left,Lateral Lower Leg. The wound measures 2.5cm length x 3.4cm width x 0.1cm depth; 6.676cm^2 area and 0.668cm^3 volume. There is Fat Layer (Subcutaneous Tissue) exposed. There is no tunneling or undermining noted. There is a medium amount of serous drainage noted. The wound margin is flat and intact. There is large (67-100%) red granulation within the wound bed. There is no necrotic tissue within the wound bed. Wound #13 status is Open. Original cause of wound was Gradually Appeared. The date acquired was: 11/01/2020. The wound is located on the Right,Proximal,Lateral Lower Leg. The wound measures 2.5cm length x 3.4cm width x 0.1cm depth; 6.676cm^2 area and 0.668cm^3 volume. There is Fat Layer (Subcutaneous Tissue) exposed. There is no  tunneling or undermining noted. There is a medium amount of serous drainage noted. The wound margin is flat and intact. There is large (67-100%) red granulation within the wound bed. There is no necrotic tissue within the wound bed. Wound #14 status is Open. Original cause of wound was Gradually Appeared. The date acquired was: 11/01/2020. The wound is located on the Right,Medial Lower Leg. The wound measures 7cm length x 4cm width x 0.1cm depth; 21.991cm^2 area and 2.199cm^3 volume. There is Fat Layer (Subcutaneous Tissue) exposed. There is no tunneling or undermining noted. There is a medium amount of serous drainage noted. The wound margin is flat and intact. There is large (67-100%) red granulation within the wound bed. There is no necrotic tissue within the wound bed. Wound #7R status is Open. Original cause of wound was Blister. The date acquired was: 04/20/2020. The wound has been in treatment 20 weeks. The wound is located on the Left,Anterior Lower Leg. The wound measures 1cm length x 0.9cm width x 0.1cm depth; 0.707cm^2 area and 0.071cm^3 volume. There is Fat Layer (Subcutaneous Tissue) exposed. There is no tunneling or undermining noted. There is a medium amount of serous drainage noted. The wound margin is flat and intact. There is large (67-100%) red granulation within the wound bed. There is no necrotic tissue within the wound bed. Wound #8 status is Open. Original cause of wound was Gradually Appeared. The date acquired was: 09/27/2020. The wound has been in treatment 5 weeks. The wound is located on the Right,Lateral Lower Leg. The wound measures 4cm length x 2.4cm width x 0.1cm depth; 7.54cm^2 area and 0.754cm^3 volume. There is Fat Layer (Subcutaneous Tissue) exposed. There is no tunneling or undermining noted. There is a medium amount of serous drainage noted. The wound margin is flat and intact. There is large (67-100%) red granulation within the wound bed. There is no necrotic tissue  within the wound bed. Wound #9 status is Open. Original cause of wound was Gradually Appeared.  The date acquired was: 10/04/2020. The wound has been in treatment 4 weeks. The wound is located on the Left,Posterior Lower Leg. The wound measures 2cm length x 2cm width x 0.1cm depth; 3.142cm^2 area and 0.314cm^3 volume. There is Fat Layer (Subcutaneous Tissue) exposed. There is no tunneling or undermining noted. There is a medium amount of serous drainage noted. The wound margin is flat and intact. There is large (67-100%) red granulation within the wound bed. There is no necrotic tissue within the wound bed. 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 There was a Three Layer Compression Therapy Procedure by Baruch Gouty, RN. Post procedure Diagnosis Wound #10: Same as Pre-Procedure Wound #13 Pre-procedure diagnosis of Wound #13 is a Venous Leg Ulcer located on the Right,Proximal,Lateral Lower Leg . There was a Three Layer Compression Therapy Procedure by Baruch Gouty, RN. Post procedure Diagnosis Wound #13: Same as Pre-Procedure Plan Follow-up Appointments: Return Appointment in 1 week. - with Dr. Heber Greenland Bathing/ Shower/ Hygiene: May shower with protection but do not get wound dressing(s) wet. - Use cast protector Edema Control - Lymphedema / SCD / Other: Elevate legs to the level of the heart or above for 30 minutes daily and/or when sitting, a frequency of: - elevate the legs throughout the day heart level if possible. Avoid standing for long periods of time. Exercise regularly Additional Orders /  Instructions: Follow Nutritious Diet Consults ordered were: Dermatology - Dr. Ronnald Ramp at [facility] for F/U of atypical lesions of bilateral lower legs WOUND #10: - Lower Leg Wound Laterality: Left, Medial, Distal Cleanser: Soap and Water 1 x Per Week/15 Days Discharge Instructions: May shower and wash wound with dial antibacterial soap and water prior to dressing change. Cleanser: Wound Cleanser 1 x Per Week/15 Days Discharge Instructions: Cleanse the wound with wound cleanser prior to applying a clean dressing using gauze sponges, not tissue or cotton balls. Peri-Wound Care: Triamcinolone 15 (g) 1 x Per Week/15 Days Discharge Instructions: In clinic only. Use triamcinolone 15 (g) in clinic only. Peri-Wound Care: Sween Lotion (Moisturizing lotion) 1 x Per Week/15 Days Discharge Instructions: Apply moisturizing lotion as directed Prim Dressing: KerraCel Ag Gelling Fiber Dressing, 4x5 in (silver alginate) 1 x Per Week/15 Days ary Discharge Instructions: Apply silver alginate to wound bed as instructed Secondary Dressing: Woven Gauze Sponge, Non-Sterile 4x4 in 1 x Per Week/15 Days Discharge Instructions: Apply over primary dressing as directed. Secondary Dressing: ABD Pad, 8x10 1 x Per Week/15 Days Discharge Instructions: Apply over primary dressing as directed. Com pression Wrap: ThreePress (3 layer compression wrap) 1 x Per Week/15 Days Discharge Instructions: Apply three layer compression as directed. WOUND #11: - Lower Leg Wound Laterality: Left, Medial, Proximal Cleanser: Soap and Water 1 x Per Week/15 Days Discharge Instructions: May shower and wash wound with dial antibacterial soap and water prior to dressing change. Cleanser: Wound Cleanser 1 x Per Week/15 Days Discharge Instructions: Cleanse the wound with wound cleanser prior to applying a clean dressing using gauze sponges, not tissue or cotton balls. Peri-Wound Care: Triamcinolone 15 (g) 1 x Per Week/15 Days Discharge  Instructions: In clinic only. Use triamcinolone 15 (g) in clinic only. Peri-Wound Care: Sween Lotion (Moisturizing lotion) 1  x Per Week/15 Days Discharge Instructions: Apply moisturizing lotion as directed Prim Dressing: KerraCel Ag Gelling Fiber Dressing, 4x5 in (silver alginate) 1 x Per Week/15 Days ary Discharge Instructions: Apply silver alginate to wound bed as instructed Secondary Dressing: Woven Gauze Sponge, Non-Sterile 4x4 in 1 x Per Week/15 Days Discharge Instructions: Apply over primary dressing as directed. Secondary Dressing: ABD Pad, 8x10 1 x Per Week/15 Days Discharge Instructions: Apply over primary dressing as directed. Com pression Wrap: ThreePress (3 layer compression wrap) 1 x Per Week/15 Days Discharge Instructions: Apply three layer compression as directed. WOUND #12: - Lower Leg Wound Laterality: Left, Lateral Cleanser: Soap and Water 1 x Per Week/15 Days Discharge Instructions: May shower and wash wound with dial antibacterial soap and water prior to dressing change. Cleanser: Wound Cleanser 1 x Per Week/15 Days Discharge Instructions: Cleanse the wound with wound cleanser prior to applying a clean dressing using gauze sponges, not tissue or cotton balls. Peri-Wound Care: Triamcinolone 15 (g) 1 x Per Week/15 Days Discharge Instructions: In clinic only. Use triamcinolone 15 (g) in clinic only. Peri-Wound Care: Sween Lotion (Moisturizing lotion) 1 x Per Week/15 Days Discharge Instructions: Apply moisturizing lotion as directed Prim Dressing: KerraCel Ag Gelling Fiber Dressing, 4x5 in (silver alginate) 1 x Per Week/15 Days ary Discharge Instructions: Apply silver alginate to wound bed as instructed Secondary Dressing: Woven Gauze Sponge, Non-Sterile 4x4 in 1 x Per Week/15 Days Discharge Instructions: Apply over primary dressing as directed. Secondary Dressing: ABD Pad, 8x10 1 x Per Week/15 Days Discharge Instructions: Apply over primary dressing as directed. Com  pression Wrap: ThreePress (3 layer compression wrap) 1 x Per Week/15 Days Discharge Instructions: Apply three layer compression as directed. WOUND #13: - Lower Leg Wound Laterality: Right, Lateral, Proximal Cleanser: Soap and Water 1 x Per Week/15 Days Discharge Instructions: May shower and wash wound with dial antibacterial soap and water prior to dressing change. Cleanser: Wound Cleanser 1 x Per Week/15 Days Discharge Instructions: Cleanse the wound with wound cleanser prior to applying a clean dressing using gauze sponges, not tissue or cotton balls. Peri-Wound Care: Triamcinolone 15 (g) 1 x Per Week/15 Days Discharge Instructions: In clinic only. Use triamcinolone 15 (g) in clinic only. Peri-Wound Care: Sween Lotion (Moisturizing lotion) 1 x Per Week/15 Days Discharge Instructions: Apply moisturizing lotion as directed Prim Dressing: KerraCel Ag Gelling Fiber Dressing, 4x5 in (silver alginate) 1 x Per Week/15 Days ary Discharge Instructions: Apply silver alginate to wound bed as instructed Secondary Dressing: Woven Gauze Sponge, Non-Sterile 4x4 in 1 x Per Week/15 Days Discharge Instructions: Apply over primary dressing as directed. Secondary Dressing: ABD Pad, 8x10 1 x Per Week/15 Days Discharge Instructions: Apply over primary dressing as directed. Com pression Wrap: ThreePress (3 layer compression wrap) 1 x Per Week/15 Days Discharge Instructions: Apply three layer compression as directed. WOUND #14: - Lower Leg Wound Laterality: Right, Medial Cleanser: Soap and Water 1 x Per Week/15 Days Discharge Instructions: May shower and wash wound with dial antibacterial soap and water prior to dressing change. Cleanser: Wound Cleanser 1 x Per Week/15 Days Discharge Instructions: Cleanse the wound with wound cleanser prior to applying a clean dressing using gauze sponges, not tissue or cotton balls. Peri-Wound Care: Triamcinolone 15 (g) 1 x Per Week/15 Days Discharge Instructions: In clinic  only. Use triamcinolone 15 (g) in clinic only. Peri-Wound Care: Sween Lotion (Moisturizing lotion) 1 x Per Week/15 Days Discharge Instructions: Apply moisturizing lotion as directed Prim Dressing: KerraCel Ag Gelling Fiber Dressing, 4x5 in (silver  alginate) 1 x Per Week/15 Days ary Discharge Instructions: Apply silver alginate to wound bed as instructed Secondary Dressing: Woven Gauze Sponge, Non-Sterile 4x4 in 1 x Per Week/15 Days Discharge Instructions: Apply over primary dressing as directed. Secondary Dressing: ABD Pad, 8x10 1 x Per Week/15 Days Discharge Instructions: Apply over primary dressing as directed. Com pression Wrap: ThreePress (3 layer compression wrap) 1 x Per Week/15 Days Discharge Instructions: Apply three layer compression as directed. WOUND #7R: - Lower Leg Wound Laterality: Left, Anterior Cleanser: Soap and Water 1 x Per Week/15 Days Discharge Instructions: May shower and wash wound with dial antibacterial soap and water prior to dressing change. Cleanser: Wound Cleanser 1 x Per Week/15 Days Discharge Instructions: Cleanse the wound with wound cleanser prior to applying a clean dressing using gauze sponges, not tissue or cotton balls. Peri-Wound Care: Triamcinolone 15 (g) 1 x Per Week/15 Days Discharge Instructions: In clinic only. Use triamcinolone 15 (g) in clinic only. Peri-Wound Care: Sween Lotion (Moisturizing lotion) 1 x Per Week/15 Days Discharge Instructions: Apply moisturizing lotion as directed Prim Dressing: KerraCel Ag Gelling Fiber Dressing, 4x5 in (silver alginate) 1 x Per Week/15 Days ary Discharge Instructions: Apply silver alginate to wound bed as instructed Secondary Dressing: Woven Gauze Sponge, Non-Sterile 4x4 in 1 x Per Week/15 Days Discharge Instructions: Apply over primary dressing as directed. Secondary Dressing: ABD Pad, 8x10 1 x Per Week/15 Days Discharge Instructions: Apply over primary dressing as directed. Com pression Wrap: ThreePress (3  layer compression wrap) 1 x Per Week/15 Days Discharge Instructions: Apply three layer compression as directed. WOUND #8: - Lower Leg Wound Laterality: Right, Lateral Cleanser: Soap and Water 1 x Per Week/15 Days Discharge Instructions: May shower and wash wound with dial antibacterial soap and water prior to dressing change. Cleanser: Wound Cleanser 1 x Per Week/15 Days Discharge Instructions: Cleanse the wound with wound cleanser prior to applying a clean dressing using gauze sponges, not tissue or cotton balls. Peri-Wound Care: Triamcinolone 15 (g) 1 x Per Week/15 Days Discharge Instructions: In clinic only. Use triamcinolone 15 (g) in clinic only. Peri-Wound Care: Sween Lotion (Moisturizing lotion) 1 x Per Week/15 Days Discharge Instructions: Apply moisturizing lotion as directed Prim Dressing: KerraCel Ag Gelling Fiber Dressing, 4x5 in (silver alginate) 1 x Per Week/15 Days ary Discharge Instructions: Apply silver alginate to wound bed as instructed Secondary Dressing: Woven Gauze Sponge, Non-Sterile 4x4 in 1 x Per Week/15 Days Discharge Instructions: Apply over primary dressing as directed. Secondary Dressing: ABD Pad, 8x10 1 x Per Week/15 Days Discharge Instructions: Apply over primary dressing as directed. Com pression Wrap: ThreePress (3 layer compression wrap) 1 x Per Week/15 Days Discharge Instructions: Apply three layer compression as directed. WOUND #9: - Lower Leg Wound Laterality: Left, Posterior Cleanser: Soap and Water 1 x Per Week/15 Days Discharge Instructions: May shower and wash wound with dial antibacterial soap and water prior to dressing change. Cleanser: Wound Cleanser 1 x Per Week/15 Days Discharge Instructions: Cleanse the wound with wound cleanser prior to applying a clean dressing using gauze sponges, not tissue or cotton balls. Peri-Wound Care: Triamcinolone 15 (g) 1 x Per Week/15 Days Discharge Instructions: In clinic only. Use triamcinolone 15 (g) in clinic  only. Peri-Wound Care: Sween Lotion (Moisturizing lotion) 1 x Per Week/15 Days Discharge Instructions: Apply moisturizing lotion as directed Prim Dressing: KerraCel Ag Gelling Fiber Dressing, 4x5 in (silver alginate) 1 x Per Week/15 Days ary Discharge Instructions: Apply silver alginate to wound bed as instructed Secondary Dressing: Woven Gauze Sponge,  Non-Sterile 4x4 in 1 x Per Week/15 Days Discharge Instructions: Apply over primary dressing as directed. Secondary Dressing: ABD Pad, 8x10 1 x Per Week/15 Days Discharge Instructions: Apply over primary dressing as directed. Com pression Wrap: ThreePress (3 layer compression wrap) 1 x Per Week/15 Days Discharge Instructions: Apply three layer compression as directed. 1. I think the patient needs TCA moisturizer and more compression i.e. 3 layer bilaterally 2. Silver alginate to the clearly wounded areas 3. There is several areas here that appear unusual to me and I have listed them above predominantly the left lateral left posterior and right mid calf flat firm nodular areas. I am somewhat concerned about the possibility of underlying skin cancer here. I have therefore going to refer her back to Dr. Ronnald Ramp 4 the patient was not wrapped I do not think while she had COVID infection he said she spent most of the time in bed. 5. When I first saw her legs I wondered about radiation for underlying squamous cell carcinoma however she vehemently denies this and she seems cognitively intact Electronic Signature(s) Signed: 11/01/2020 4:49:53 PM By: Linton Ham MD Entered By: Linton Ham on 11/01/2020 15:07:13 -------------------------------------------------------------------------------- SuperBill Details Patient Name: Date of Service: [name], Deiondra A. 11/01/2020 Medical Record Number: [id number] Patient Account Number: [id number] Date of Birth/Sex: Treating RN: 1925/04/22 (87 y.o. [name] Primary Care Provider: [name] Other Clinician: Referring Provider: Treating Provider/Extender: Thayer Headings in Treatment: 20 Diagnosis Coding ICD-10 Codes Code Description C44.92 Squamous cell carcinoma of skin, unspecified L97.819 Non-pressure chronic ulcer of other part of right lower leg with unspecified severity L97.829 Non-pressure chronic ulcer of other part of left lower leg with unspecified severity I87.2 Venous insufficiency (chronic) (peripheral) Facility Procedures CPT4: Code [id number] Description: 81 BILATERAL: Application of multi-layer venous compression system; leg (below knee), including ankle and t. Modifier: Quantity: 1 Physician Procedures : CPT4 Code Description Modifier [id number] - WC PHYS LEVEL 4 - EST PT ICD-10 Diagnosis Description C44.92 Squamous cell carcinoma of skin, unspecified L97.819 Non-pressure chronic ulcer of other part of right lower leg with unspecified severity  L97.829 Non-pressure chronic ulcer of other part of left lower leg with unspecified severity I87.2 Venous insufficiency (chronic) (peripheral) Quantity: 1 Electronic Signature(s) Signed: 11/01/2020 4:49:53 PM By: Linton Ham MD Entered By: Linton Ham on 11/01/2020 15:07:34

## 2020-11-01 NOTE — Progress Notes (Signed)
[name] ([id number]) , Visit Report for 11/01/2020 Arrival Information Details Patient Name: Date of Service: [name], [place]. 11/01/2020 1:30 PM Medical Record Number: [id number] Patient Account Number: [id number] Date of Birth/Sex: Treating RN: 08/01/1925 (87 y.o. Elam Dutch Primary Care Judene Logue: [name] Other Clinician: Referring Adler Chartrand: Treating Skylar Flynt/Extender: Thayer Headings in Treatment: 20 Visit Information History Since Last Visit Added or deleted any medications: No Patient Arrived: Wheel Chair Any new allergies or adverse reactions: No Arrival Time: 13:43 Had a fall or experienced change in No Accompanied By: self activities of daily living that may affect Transfer Assistance: Manual risk of falls: Patient Identification Verified: Yes Signs or symptoms of abuse/neglect since last visito No Secondary Verification Process Completed: Yes Hospitalized since last visit: No Patient Requires Transmission-Based No Has Dressing in Place as Prescribed: Yes Precautions: Pain Present Now: No Patient Has Alerts: Yes Patient Alerts: R ABI = Non compressible L ABI = Non compressible Electronic Signature(s) Signed: 11/01/2020 4:48:42 PM By: Baruch Gouty RN, BSN Entered By: Baruch Gouty on 11/01/2020 13:44:14 -------------------------------------------------------------------------------- Compression Therapy Details Patient Name: Date of Service: [name], [name] 11/01/2020 1:30 PM Medical Record Number: [id number] Patient Account Number: [id number] Date of Birth/Sex: Treating RN: 22-Jan-1925 (87 y.o. Elam Dutch Primary Care Jori Thrall: [name] Other Clinician: Referring Theron Cumbie: Treating Zaydin Billey/Extender: Thayer Headings in Treatment: 20 Compression Therapy Performed for Wound Assessment: Wound #10 Left,Distal,Medial Lower Leg Performed By: Clinician Baruch Gouty, RN Compression Type: Three Layer Post Procedure Diagnosis Same as Pre-procedure Electronic Signature(s) Signed: 11/01/2020 4:48:42 PM By: Baruch Gouty RN, BSN Entered By: Baruch Gouty on 11/01/2020 14:33:15 -------------------------------------------------------------------------------- Compression Therapy Details Patient Name: Date of Service: [name], [name] 11/01/2020 1:30 PM Medical Record Number: [id number] Patient Account Number: [id number] Date of Birth/Sex: Treating RN: 11-16-1925 (87 y.o. Elam Dutch Primary Care Analiz Tvedt: [name] Other Clinician: Referring [place] Ndiaye: Treating Evanell Redlich/Extender: Thayer Headings in Treatment: 20 Compression Therapy Performed for Wound Assessment: Wound #13 Right,Proximal,Lateral Lower Leg Performed By: Clinician Baruch Gouty, RN Compression Type: Three Layer Post Procedure Diagnosis Same as Pre-procedure Electronic Signature(s) Signed: 11/01/2020 4:48:42 PM By: Baruch Gouty RN, BSN Entered By: Baruch Gouty on 11/01/2020 14:33:15 -------------------------------------------------------------------------------- Encounter Discharge Information Details Patient Name: Date of Service: [name], [name] [place]. 11/01/2020 1:30 PM Medical Record Number: [id number] Patient Account Number: [id number] Date of Birth/Sex: Treating RN: 1925-02-07 (87 y.o. Elam Dutch Primary Care Beckham Buxbaum: [name] Other Clinician: Referring Donnajean Chesnut: Treating Denina Rieger/Extender: Thayer Headings in Treatment: 20 Encounter Discharge Information Items Discharge Condition: Stable Ambulatory Status: Wheelchair Discharge Destination: Home Transportation: Other Accompanied By: self Schedule Follow-up Appointment: Yes Clinical Summary of Care: Patient Declined Notes transportation service Electronic Signature(s) Signed: 11/01/2020 4:48:42 PM By: Baruch Gouty RN, BSN Entered By: Baruch Gouty on 11/01/2020 14:59:49 -------------------------------------------------------------------------------- Lower Extremity Assessment Details Patient Name: Date of Service: [name], [name] [place]. 11/01/2020 1:30 PM Medical Record Number: [id number] Patient Account Number: [id number] Date of Birth/Sex: Treating RN: June 23, 1925 (87 y.o. Elam Dutch Primary Care Rasheema Truluck: [name] Other Clinician: Referring Zaidan Keeble: Treating Kenna Seward/Extender: Thayer Headings in Treatment: 20 Edema Assessment Assessed: [Left: No] [Right: No] Edema: [Left: Yes] [Right: Yes] Calf Left: Right: Point of Measurement: 30 cm From Medial Instep 38.5 cm 33.8 cm Ankle Left: Right: Point of Measurement: 9 cm From Medial Instep 23.8 cm 23 cm Vascular Assessment Pulses: Dorsalis  Pedis Palpable: [Left:Yes] [Right:Yes] Electronic Signature(s) Signed: 11/01/2020 4:48:42 PM By: Baruch Gouty RN, BSN Entered By: Baruch Gouty on 11/01/2020 13:52:42 -------------------------------------------------------------------------------- Multi Wound Chart Details Patient Name: Date of Service: [name], [name] 11/01/2020 1:30 PM Medical Record Number: [id number] Patient Account Number: [id number] Date of Birth/Sex: Treating RN: 1925-03-25 (87 y.o. [name], [name] Primary Care Debe Anfinson: [name] Other Clinician: Referring Talib Headley: Treating Jguadalupe Opiela/Extender: Thayer Headings in Treatment: 20 Vital Signs Height(in): 62 Pulse(bpm): 23 Weight(lbs): 163 Blood Pressure(mmHg): 116/68 Body Mass Index(BMI): 27 Temperature(F): 98.4 Respiratory Rate(breaths/min): 18 Photos: Left, Distal, Medial Lower Leg Left, Proximal, Medial Lower Leg Left, Lateral Lower Leg Wound Location: Gradually Appeared Gradually Appeared Gradually Appeared Wounding Event: Venous Leg Ulcer Venous Leg Ulcer Venous Leg  Ulcer Primary Etiology: Anemia, Arrhythmia, Congestive Heart Anemia, Arrhythmia, Congestive Heart Anemia, Arrhythmia, Congestive Heart Comorbid History: Failure, Hypertension, Colitis, Failure, Hypertension, Colitis, Failure, Hypertension, Colitis, Osteoarthritis Osteoarthritis Osteoarthritis 10/11/2020 11/01/2020 11/01/2020 Date Acquired: 3 0 0 Weeks of Treatment: Open Open Open Wound Status: No No No Wound Recurrence: No Yes Yes Clustered Wound: N/A N/A 3 Clustered Quantity: 4.5x4.5x0.1 8.4x5x0.1 2.5x3.4x0.1 Measurements L x W x D (cm) 15.904 32.987 6.676 A (cm) : rea 1.59 3.299 0.668 Volume (cm) : -3276.60% 0.00% 0.00% % Reduction in Area: -3283.00% 0.00% 0.00% % Reduction in Volume: Full Thickness Without Exposed Full Thickness Without Exposed Full Thickness Without Exposed Classification: Support Structures Support Structures Support Structures Medium Medium Medium Exudate Amount: Serous Serous Serous Exudate Type: amber Physiological scientist Exudate Color: Flat and Intact Flat and Intact Flat and Intact Wound Margin: Large (67-100%) Large (67-100%) Large (67-100%) Granulation Amount: Red Red Red Granulation Quality: None Present (0%) None Present (0%) None Present (0%) Necrotic Amount: Fat Layer (Subcutaneous Tissue): Yes Fat Layer (Subcutaneous Tissue): Yes Fat Layer (Subcutaneous Tissue): Yes Exposed Structures: Fascia: No Fascia: No Fascia: No Tendon: No Tendon: No Tendon: No [name]: No [name]: No [name]: No Joint: No Joint: No Joint: No Bone: No Bone: No Bone: No None Medium (34-66%) Medium (34-66%) Epithelialization: Compression Therapy N/A N/A Procedures Performed: Wound Number: [id number] Photos: Right, Proximal, Lateral Lower Leg Right, Medial Lower Leg Left, Anterior Lower Leg Wound Location: Gradually Appeared Gradually Appeared Blister Wounding Event: Venous Leg Ulcer Venous Leg Ulcer Malignant Wound Primary Etiology: Anemia,  Arrhythmia, Congestive Heart Anemia, Arrhythmia, Congestive Heart Anemia, Arrhythmia, Congestive Heart Comorbid History: Failure, Hypertension, Colitis, Failure, Hypertension, Colitis, Failure, Hypertension, Colitis, Osteoarthritis Osteoarthritis Osteoarthritis 11/01/2020 11/01/2020 04/20/2020 Date Acquired: 0 0 20 Weeks of Treatment: Open Open Open Wound Status: No No Yes Wound Recurrence: Yes Yes No Clustered Wound: 3 3 N/A Clustered Quantity: 2.5x3.4x0.1 7x4x0.1 1x0.9x0.1 Measurements L x W x D (cm) 6.676 21.991 0.707 A (cm) : rea 0.668 2.199 0.071 Volume (cm) : 0.00% 0.00% 77.00% % Reduction in Area: 0.00% 0.00% 76.90% % Reduction in Volume: Full Thickness Without Exposed Full Thickness Without Exposed Full Thickness Without Exposed Classification: Support Structures Support Structures Support Structures Medium Medium Medium Exudate Amount: Serous Serous Serous Exudate Type: Geophysical data processor Exudate Color: Flat and Intact Flat and Intact Flat and Intact Wound Margin: Large (67-100%) Large (67-100%) Large (67-100%) Granulation Amount: Red Red Red Granulation Quality: None Present (0%) None Present (0%) None Present (0%) Necrotic Amount: Fat Layer (Subcutaneous Tissue): Yes Fat Layer (Subcutaneous Tissue): Yes Fat Layer (Subcutaneous Tissue): Yes Exposed Structures: Fascia: No Fascia: No Fascia: No Tendon: No Tendon: No Tendon: No [name]: No [name]: No [name]: No Joint: No Joint: No Joint:  No Bone: No Bone: No Bone: No Medium (34-66%) Medium (34-66%) Medium (34-66%) Epithelialization: Compression Therapy N/A N/A Procedures Performed: Wound Number: [id number] N/A Photos: N/A Right, Lateral Lower Leg Left, Posterior Lower Leg N/A Wound Location: Gradually Appeared Gradually Appeared N/A Wounding Event: Venous Leg Ulcer Venous Leg Ulcer N/A Primary Etiology: Anemia, Arrhythmia, Congestive Heart Anemia, Arrhythmia, Congestive Heart N/A Comorbid  History: Failure, Hypertension, Colitis, Failure, Hypertension, Colitis, Osteoarthritis Osteoarthritis 09/27/2020 10/04/2020 N/A Date Acquired: 5 4 N/A Weeks of Treatment: Open Open N/A Wound Status: No No N/A Wound Recurrence: No No N/A Clustered Wound: N/A N/A N/A Clustered Quantity: 4x2.4x0.1 2x2x0.1 N/A Measurements L x W x D (cm) 7.54 3.142 N/A A (cm) : rea 0.754 0.314 N/A Volume (cm) : -1500.80% -1571.30% N/A % Reduction in Area: -1504.30% -1552.60% N/A % Reduction in Volume: Full Thickness Without Exposed Full Thickness Without Exposed N/A Classification: Support Structures Support Structures Medium Medium N/A Exudate Amount: Serous Serous N/A Exudate Type: Physiological scientist N/A Exudate Color: Flat and Intact Flat and Intact N/A Wound Margin: Large (67-100%) Large (67-100%) N/A Granulation Amount: Red Red N/A Granulation Quality: None Present (0%) None Present (0%) N/A Necrotic Amount: Fat Layer (Subcutaneous Tissue): Yes Fat Layer (Subcutaneous Tissue): Yes N/A Exposed Structures: Fascia: No Fascia: No Tendon: No Tendon: No [name]: No [name]: No Joint: No Joint: No Bone: No Bone: No Medium (34-66%) Medium (34-66%) N/A Epithelialization: N/A N/A N/A Procedures Performed: Treatment Notes Electronic Signature(s) Signed: 11/01/2020 4:49:53 PM By: Linton Ham MD Signed: 11/01/2020 5:46:14 PM By: Deon Pilling RN, BSN Entered By: Linton Ham on 11/01/2020 14:57:25 -------------------------------------------------------------------------------- Multi-Disciplinary Care Plan Details Patient Name: Date of Service: [name], [place] A. 11/01/2020 1:30 PM Medical Record Number: [id number] Patient Account Number: [id number] Date of Birth/Sex: Treating RN: 1925/03/18 (87 y.o. Elam Dutch Primary Care Ethen Bannan: [name] Other Clinician: Referring Lesslie Mckeehan: Treating Mavrick Mcquigg/Extender: Thayer Headings in  Treatment: [place] reviewed with physician Active Inactive Venous Leg Ulcer Nursing Diagnoses: Knowledge deficit related to disease process and management Potential for venous Insuffiency (use before diagnosis confirmed) Goals: Patient will maintain optimal edema control Date Initiated: 11/01/2020 Target Resolution Date: 11/29/2020 Goal Status: Active Interventions: Assess peripheral edema status every visit. Compression as ordered Provide education on venous insufficiency Treatment Activities: Therapeutic compression applied : 11/01/2020 Notes: Wound/Skin Impairment Nursing Diagnoses: Impaired tissue integrity Knowledge deficit related to ulceration/compromised skin integrity Goals: Patient/caregiver will verbalize understanding of skin care regimen Date Initiated: 06/11/2020 Target Resolution Date: 11/15/2020 Goal Status: Active Interventions: Assess patient/caregiver ability to obtain necessary supplies Assess patient/caregiver ability to perform ulcer/skin care regimen upon admission and as needed Assess ulceration(s) every visit Provide education on ulcer and skin care Notes: 10/11/20: Wound care regimen ongoing. Electronic Signature(s) Signed: 11/01/2020 4:48:42 PM By: Baruch Gouty RN, BSN Entered By: Baruch Gouty on 11/01/2020 14:09:08 -------------------------------------------------------------------------------- Pain Assessment Details Patient Name: Date of Service: [name], [place]. 11/01/2020 1:30 PM Medical Record Number: [id number] Patient Account Number: [id number] Date of Birth/Sex: Treating RN: January 20, 1926 (87 y.o. Elam Dutch Primary Care Sybel Standish: [name] Other Clinician: Referring Anae Hams: Treating Mena Lienau/Extender: Thayer Headings in Treatment: 20 Active Problems Location of Pain Severity and Description of Pain Patient Has Paino No Site Locations Rate the  pain. Current Pain Level: 0 Pain Management and Medication Current Pain Management: Electronic Signature(s) Signed: 11/01/2020 4:48:42 PM By: Baruch Gouty RN, BSN Entered By: Baruch Gouty on 11/01/2020 13:44:33 -------------------------------------------------------------------------------- Patient/Caregiver Education Details Patient Name:  Date of Service: [name], [place] A. 10/13/2022andnbsp1:30 PM Medical Record Number: [id number] Patient Account Number: [id number] Date of Birth/Gender: Treating RN: 20-Jun-1925 (87 y.o. Elam Dutch Primary Care Physician: [name] Other Clinician: Referring Physician: Treating Physician/Extender: Thayer Headings in Treatment: 20 Education Assessment Education Provided To: Patient Education Topics Provided Venous: Methods: Explain/Verbal Responses: Reinforcements needed, State content correctly Wound/Skin Impairment: Methods: Explain/Verbal Responses: Reinforcements needed, State content correctly Electronic Signature(s) Signed: 11/01/2020 4:48:42 PM By: Baruch Gouty RN, BSN Entered By: Baruch Gouty on 11/01/2020 14:09:38 -------------------------------------------------------------------------------- Wound Assessment Details Patient Name: Date of Service: [name], Nga A. 11/01/2020 1:30 PM Medical Record Number: [id number] Patient Account Number: [id number] Date of Birth/Sex: Treating RN: 02-25-1925 (87 y.o. Elam Dutch Primary Care Duane Trias: [name] Other Clinician: Referring Sequita Wise: Treating Shavanna Furnari/Extender: Thayer Headings in Treatment: 20 Wound Status Wound Number: 10 Primary Venous Leg Ulcer Etiology: Wound Location: Left, Distal, Medial Lower Leg Wound Open Wounding Event: Gradually Appeared Status: Date Acquired: 10/11/2020 Comorbid Anemia, Arrhythmia, Congestive Heart Failure, Hypertension, Weeks Of Treatment:  3 History: Colitis, Osteoarthritis Clustered Wound: No Photos Wound Measurements Length: (cm) 4.5 Width: (cm) 4.5 Depth: (cm) 0.1 Area: (cm) 15.904 Volume: (cm) 1.59 Wound Description Classification: Full Thickness Without Exposed Support Structu Wound Margin: Flat and Intact Exudate Amount: Medium Exudate Type: Serous Exudate Color: amber Foul Odor After Cleansing: Slough/Fibrino % Reduction in Area: -3276.6% % Reduction in Volume: -3283% Epithelialization: None Tunneling: No Undermining: No res No No Wound Bed Granulation Amount: Large (67-100%) Exposed Structure Granulation Quality: Red Fascia Exposed: No Necrotic Amount: None Present (0%) Fat Layer (Subcutaneous Tissue) Exposed: Yes Tendon Exposed: No [name] Exposed: No Joint Exposed: No Bone Exposed: No Treatment Notes Wound #10 (Lower Leg) Wound Laterality: Left, Medial, Distal Cleanser Soap and Water Discharge Instruction: May shower and wash wound with dial antibacterial soap and water prior to dressing change. Wound Cleanser Discharge Instruction: Cleanse the wound with wound cleanser prior to applying a clean dressing using gauze sponges, not tissue or cotton balls. Peri-Wound Care Triamcinolone 15 (g) Discharge Instruction: In clinic only. Use triamcinolone 15 (g) in clinic only. Sween Lotion (Moisturizing lotion) Discharge Instruction: Apply moisturizing lotion as directed Topical Primary Dressing KerraCel Ag Gelling Fiber Dressing, 4x5 in (silver alginate) Discharge Instruction: Apply silver alginate to wound bed as instructed Secondary Dressing Woven Gauze Sponge, Non-Sterile 4x4 in Discharge Instruction: Apply over primary dressing as directed. ABD Pad, 8x10 Discharge Instruction: Apply over primary dressing as directed. Secured With Compression Wrap ThreePress (3 layer compression wrap) Discharge Instruction: Apply three layer compression as directed. Compression  Stockings Add-Ons Electronic Signature(s) Signed: 11/01/2020 4:48:42 PM By: Baruch Gouty RN, BSN Signed: 11/01/2020 5:46:14 PM By: Deon Pilling RN, BSN Entered By: Deon Pilling on 11/01/2020 14:02:03 -------------------------------------------------------------------------------- Wound Assessment Details Patient Name: Date of Service: [name], [place]. 11/01/2020 1:30 PM Medical Record Number: [id number] Patient Account Number: [id number] Date of Birth/Sex: Treating RN: 04-30-1925 (87 y.o. Elam Dutch Primary Care Kevyn Boquet: [name] Other Clinician: Referring Shaelin Lalley: Treating Jaonna Word/Extender: Thayer Headings in Treatment: 20 Wound Status Wound Number: 11 Primary Venous Leg Ulcer Etiology: Wound Location: Left, Proximal, Medial Lower Leg Wound Open Wounding Event: Gradually Appeared Status: Date Acquired: 11/01/2020 Comorbid Anemia, Arrhythmia, Congestive Heart Failure, Hypertension, Weeks Of Treatment: 0 History: Colitis, Osteoarthritis Clustered Wound: Yes Photos Wound Measurements Length: (cm) 8.4 Width: (cm) 5 Depth: (cm) 0.1 Area: (cm) 32.987 Volume: (cm) 3.299 % Reduction in  Area: 0% % Reduction in Volume: 0% Epithelialization: Medium (34-66%) Tunneling: No Undermining: No Wound Description Classification: Full Thickness Without Exposed Support Structures Wound Margin: Flat and Intact Exudate Amount: Medium Exudate Type: Serous Exudate Color: amber Foul Odor After Cleansing: No Slough/Fibrino No Wound Bed Granulation Amount: Large (67-100%) Exposed Structure Granulation Quality: Red Fascia Exposed: No Necrotic Amount: None Present (0%) Fat Layer (Subcutaneous Tissue) Exposed: Yes Tendon Exposed: No [name] Exposed: No Joint Exposed: No Bone Exposed: No Treatment Notes Wound #11 (Lower Leg) Wound Laterality: Left, Medial, Proximal Cleanser Soap and Water Discharge Instruction: May shower and  wash wound with dial antibacterial soap and water prior to dressing change. Wound Cleanser Discharge Instruction: Cleanse the wound with wound cleanser prior to applying a clean dressing using gauze sponges, not tissue or cotton balls. Peri-Wound Care Triamcinolone 15 (g) Discharge Instruction: In clinic only. Use triamcinolone 15 (g) in clinic only. Sween Lotion (Moisturizing lotion) Discharge Instruction: Apply moisturizing lotion as directed Topical Primary Dressing KerraCel Ag Gelling Fiber Dressing, 4x5 in (silver alginate) Discharge Instruction: Apply silver alginate to wound bed as instructed Secondary Dressing Woven Gauze Sponge, Non-Sterile 4x4 in Discharge Instruction: Apply over primary dressing as directed. ABD Pad, 8x10 Discharge Instruction: Apply over primary dressing as directed. Secured With Compression Wrap ThreePress (3 layer compression wrap) Discharge Instruction: Apply three layer compression as directed. Compression Stockings Add-Ons Electronic Signature(s) Signed: 11/01/2020 4:48:42 PM By: Baruch Gouty RN, BSN Signed: 11/01/2020 5:46:14 PM By: Deon Pilling RN, BSN Entered By: Deon Pilling on 11/01/2020 14:04:41 -------------------------------------------------------------------------------- Wound Assessment Details Patient Name: Date of Service: [name], [place]. 11/01/2020 1:30 PM Medical Record Number: [id number] Patient Account Number: [id number] Date of Birth/Sex: Treating RN: 11-Nov-1925 (87 y.o. Elam Dutch Primary Care Lovette Merta: [name] Other Clinician: Referring Tully Mcinturff: Treating Kelsei Defino/Extender: Thayer Headings in Treatment: 20 Wound Status Wound Number: 12 Primary Venous Leg Ulcer Etiology: Wound Location: Left, Lateral Lower Leg Wound Open Wounding Event: Gradually Appeared Status: Date Acquired: 11/01/2020 Comorbid Anemia, Arrhythmia, Congestive Heart Failure, Hypertension, Weeks  Of Treatment: 0 History: Colitis, Osteoarthritis Clustered Wound: Yes Photos Wound Measurements Length: (cm) 2.5 Width: (cm) 3.4 Depth: (cm) 0.1 Clustered Quantity: 3 Area: (cm) 6.676 Volume: (cm) 0.668 % Reduction in Area: 0% % Reduction in Volume: 0% Epithelialization: Medium (34-66%) Tunneling: No Undermining: No Wound Description Classification: Full Thickness Without Exposed Support Structures Wound Margin: Flat and Intact Exudate Amount: Medium Exudate Type: Serous Exudate Color: amber Foul Odor After Cleansing: No Slough/Fibrino No Wound Bed Granulation Amount: Large (67-100%) Exposed Structure Granulation Quality: Red Fascia Exposed: No Necrotic Amount: None Present (0%) Fat Layer (Subcutaneous Tissue) Exposed: Yes Tendon Exposed: No [name] Exposed: No Joint Exposed: No Bone Exposed: No Treatment Notes Wound #12 (Lower Leg) Wound Laterality: Left, Lateral Cleanser Soap and Water Discharge Instruction: May shower and wash wound with dial antibacterial soap and water prior to dressing change. Wound Cleanser Discharge Instruction: Cleanse the wound with wound cleanser prior to applying a clean dressing using gauze sponges, not tissue or cotton balls. Peri-Wound Care Triamcinolone 15 (g) Discharge Instruction: In clinic only. Use triamcinolone 15 (g) in clinic only. Sween Lotion (Moisturizing lotion) Discharge Instruction: Apply moisturizing lotion as directed Topical Primary Dressing KerraCel Ag Gelling Fiber Dressing, 4x5 in (silver alginate) Discharge Instruction: Apply silver alginate to wound bed as instructed Secondary Dressing Woven Gauze Sponge, Non-Sterile 4x4 in Discharge Instruction: Apply over primary dressing as directed. ABD Pad, 8x10 Discharge Instruction: Apply over primary dressing as directed. Secured  With Compression Wrap ThreePress (3 layer compression wrap) Discharge Instruction: Apply three layer compression as  directed. Compression Stockings Add-Ons Electronic Signature(s) Signed: 11/01/2020 4:48:42 PM By: Baruch Gouty RN, BSN Signed: 11/01/2020 5:46:14 PM By: Deon Pilling RN, BSN Entered By: Deon Pilling on 11/01/2020 14:05:36 -------------------------------------------------------------------------------- Wound Assessment Details Patient Name: Date of Service: [name], [place]. 11/01/2020 1:30 PM Medical Record Number: [id number] Patient Account Number: [id number] Date of Birth/Sex: Treating RN: [birth year] (87 y.o. Elam Dutch Primary Care Chala Gul: [name] Other Clinician: Referring Isidor Bromell: Treating Londen Bok/Extender: Thayer Headings in Treatment: 20 Wound Status Wound Number: 13 Primary Venous Leg Ulcer Etiology: Wound Location: Right, Proximal, Lateral Lower Leg Wound Open Wounding Event: Gradually Appeared Status: Date Acquired: 11/01/2020 Comorbid Anemia, Arrhythmia, Congestive Heart Failure, Hypertension, Weeks Of Treatment: 0 History: Colitis, Osteoarthritis Clustered Wound: Yes Photos Wound Measurements Length: (cm) 2.5 Width: (cm) 3.4 Depth: (cm) 0.1 Clustered Quantity: 3 Area: (cm) 6.676 Volume: (cm) 0.668 % Reduction in Area: 0% % Reduction in Volume: 0% Epithelialization: Medium (34-66%) Tunneling: No Undermining: No Wound Description Classification: Full Thickness Without Exposed Support Structures Wound Margin: Flat and Intact Exudate Amount: Medium Exudate Type: Serous Exudate Color: amber Foul Odor After Cleansing: No Slough/Fibrino No Wound Bed Granulation Amount: Large (67-100%) Exposed Structure Granulation Quality: Red Fascia Exposed: No Necrotic Amount: None Present (0%) Fat Layer (Subcutaneous Tissue) Exposed: Yes Tendon Exposed: No [name] Exposed: No Joint Exposed: No Bone Exposed: No Treatment Notes Wound #13 (Lower Leg) Wound Laterality: Right, Lateral, Proximal Cleanser Soap  and Water Discharge Instruction: May shower and wash wound with dial antibacterial soap and water prior to dressing change. Wound Cleanser Discharge Instruction: Cleanse the wound with wound cleanser prior to applying a clean dressing using gauze sponges, not tissue or cotton balls. Peri-Wound Care Triamcinolone 15 (g) Discharge Instruction: In clinic only. Use triamcinolone 15 (g) in clinic only. Sween Lotion (Moisturizing lotion) Discharge Instruction: Apply moisturizing lotion as directed Topical Primary Dressing KerraCel Ag Gelling Fiber Dressing, 4x5 in (silver alginate) Discharge Instruction: Apply silver alginate to wound bed as instructed Secondary Dressing Woven Gauze Sponge, Non-Sterile 4x4 in Discharge Instruction: Apply over primary dressing as directed. ABD Pad, 8x10 Discharge Instruction: Apply over primary dressing as directed. Secured With Compression Wrap ThreePress (3 layer compression wrap) Discharge Instruction: Apply three layer compression as directed. Compression Stockings Add-Ons Electronic Signature(s) Signed: 11/01/2020 4:48:42 PM By: Baruch Gouty RN, BSN Entered By: Baruch Gouty on 11/01/2020 14:08:02 -------------------------------------------------------------------------------- Wound Assessment Details Patient Name: Date of Service: [name], [place]. 11/01/2020 1:30 PM Medical Record Number: [id number] Patient Account Number: [id number] Date of Birth/Sex: Treating RN: 1925/04/17 (87 y.o. Elam Dutch Primary Care Amjad Fikes: [name] Other Clinician: Referring Jode Lippe: Treating Samanthajo Payano/Extender: Thayer Headings in Treatment: 20 Wound Status Wound Number: 14 Primary Venous Leg Ulcer Etiology: Wound Location: Right, Medial Lower Leg Wound Open Wounding Event: Gradually Appeared Status: Date Acquired: 11/01/2020 Comorbid Anemia, Arrhythmia, Congestive Heart Failure, Hypertension, Weeks Of  Treatment: 0 History: Colitis, Osteoarthritis Clustered Wound: Yes Photos Wound Measurements Length: (cm) 7 Width: (cm) 4 Depth: (cm) 0.1 Clustered Quantity: 3 Area: (cm) 21.991 Volume: (cm) 2.199 % Reduction in Area: 0% % Reduction in Volume: 0% Epithelialization: Medium (34-66%) Tunneling: No Undermining: No Wound Description Classification: Full Thickness Without Exposed Support Structures Wound Margin: Flat and Intact Exudate Amount: Medium Exudate Type: Serous Exudate Color: amber Foul Odor After Cleansing: No Slough/Fibrino No Wound Bed Granulation Amount: Large (67-100%) Exposed Structure  Granulation Quality: Red Fascia Exposed: No Necrotic Amount: None Present (0%) Fat Layer (Subcutaneous Tissue) Exposed: Yes Tendon Exposed: No [name] Exposed: No Joint Exposed: No Bone Exposed: No Treatment Notes Wound #14 (Lower Leg) Wound Laterality: Right, Medial Cleanser Soap and Water Discharge Instruction: May shower and wash wound with dial antibacterial soap and water prior to dressing change. Wound Cleanser Discharge Instruction: Cleanse the wound with wound cleanser prior to applying a clean dressing using gauze sponges, not tissue or cotton balls. Peri-Wound Care Triamcinolone 15 (g) Discharge Instruction: In clinic only. Use triamcinolone 15 (g) in clinic only. Sween Lotion (Moisturizing lotion) Discharge Instruction: Apply moisturizing lotion as directed Topical Primary Dressing KerraCel Ag Gelling Fiber Dressing, 4x5 in (silver alginate) Discharge Instruction: Apply silver alginate to wound bed as instructed Secondary Dressing Woven Gauze Sponge, Non-Sterile 4x4 in Discharge Instruction: Apply over primary dressing as directed. ABD Pad, 8x10 Discharge Instruction: Apply over primary dressing as directed. Secured With Compression Wrap ThreePress (3 layer compression wrap) Discharge Instruction: Apply three layer compression as directed. Compression  Stockings Add-Ons Electronic Signature(s) Signed: 11/01/2020 4:48:42 PM By: Baruch Gouty RN, BSN Signed: 11/01/2020 5:46:14 PM By: Deon Pilling RN, BSN Entered By: Deon Pilling on 11/01/2020 14:08:22 -------------------------------------------------------------------------------- Wound Assessment Details Patient Name: Date of Service: [name], [place]. 11/01/2020 1:30 PM Medical Record Number: [id number] Patient Account Number: [id number] Date of Birth/Sex: Treating RN: 1925-09-13 (87 y.o. Elam Dutch Primary Care Miia Blanks: [name] Other Clinician: Referring Katriel Cutsforth: Treating Talibah Colasurdo/Extender: Thayer Headings in Treatment: 20 Wound Status Wound Number: 7R Primary Malignant Wound Etiology: Wound Location: Left, Anterior Lower Leg Wound Open Wounding Event: Blister Status: Date Acquired: 04/20/2020 Comorbid Anemia, Arrhythmia, Congestive Heart Failure, Hypertension, Weeks Of Treatment: 20 History: Colitis, Osteoarthritis Clustered Wound: No Photos Wound Measurements Length: (cm) 1 Width: (cm) 0.9 Depth: (cm) 0.1 Area: (cm) 0.707 Volume: (cm) 0.071 % Reduction in Area: 77% % Reduction in Volume: 76.9% Epithelialization: Medium (34-66%) Tunneling: No Undermining: No Wound Description Classification: Full Thickness Without Exposed Support Structu Wound Margin: Flat and Intact Exudate Amount: Medium Exudate Type: Serous Exudate Color: amber res Wound Bed Granulation Amount: Large (67-100%) Exposed Structure Granulation Quality: Red Fascia Exposed: No Necrotic Amount: None Present (0%) Fat Layer (Subcutaneous Tissue) Exposed: Yes Tendon Exposed: No [name] Exposed: No Joint Exposed: No Bone Exposed: No Treatment Notes Wound #7R (Lower Leg) Wound Laterality: Left, Anterior Cleanser Soap and Water Discharge Instruction: May shower and wash wound with dial antibacterial soap and water prior to dressing  change. Wound Cleanser Discharge Instruction: Cleanse the wound with wound cleanser prior to applying a clean dressing using gauze sponges, not tissue or cotton balls. Peri-Wound Care Triamcinolone 15 (g) Discharge Instruction: In clinic only. Use triamcinolone 15 (g) in clinic only. Sween Lotion (Moisturizing lotion) Discharge Instruction: Apply moisturizing lotion as directed Topical Primary Dressing KerraCel Ag Gelling Fiber Dressing, 4x5 in (silver alginate) Discharge Instruction: Apply silver alginate to wound bed as instructed Secondary Dressing Woven Gauze Sponge, Non-Sterile 4x4 in Discharge Instruction: Apply over primary dressing as directed. ABD Pad, 8x10 Discharge Instruction: Apply over primary dressing as directed. Secured With Compression Wrap ThreePress (3 layer compression wrap) Discharge Instruction: Apply three layer compression as directed. Compression Stockings Add-Ons Electronic Signature(s) Signed: 11/01/2020 4:48:42 PM By: Baruch Gouty RN, BSN Signed: 11/01/2020 5:46:14 PM By: Deon Pilling RN, BSN Entered By: Deon Pilling on 11/01/2020 14:02:38 -------------------------------------------------------------------------------- Wound Assessment Details Patient Name: Date of Service: [name], [place]. 11/01/2020 1:30 PM Medical Record  Number: [id number] Patient Account Number: [id number] Date of Birth/Sex: Treating RN: [birth year] (87 y.o. Elam Dutch Primary Care Martise Waddell: [name] Other Clinician: Referring Glorene Leitzke: Treating Traycen Goyer/Extender: Thayer Headings in Treatment: 20 Wound Status Wound Number: 8 Primary Venous Leg Ulcer Etiology: Wound Location: Right, Lateral Lower Leg Wound Open Wounding Event: Gradually Appeared Status: Date Acquired: 09/27/2020 Comorbid Anemia, Arrhythmia, Congestive Heart Failure, Hypertension, Weeks Of Treatment: 5 History: Colitis, Osteoarthritis Clustered Wound:  No Photos Wound Measurements Length: (cm) 4 Width: (cm) 2.4 Depth: (cm) 0.1 Area: (cm) 7.54 Volume: (cm) 0.754 % Reduction in Area: -1500.8% % Reduction in Volume: -1504.3% Epithelialization: Medium (34-66%) Tunneling: No Undermining: No Wound Description Classification: Full Thickness Without Exposed Support Structu Wound Margin: Flat and Intact Exudate Amount: Medium Exudate Type: Serous Exudate Color: amber res Wound Bed Granulation Amount: Large (67-100%) Exposed Structure Granulation Quality: Red Fascia Exposed: No Necrotic Amount: None Present (0%) Fat Layer (Subcutaneous Tissue) Exposed: Yes Tendon Exposed: No [name] Exposed: No Joint Exposed: No Bone Exposed: No Treatment Notes Wound #8 (Lower Leg) Wound Laterality: Right, Lateral Cleanser Soap and Water Discharge Instruction: May shower and wash wound with dial antibacterial soap and water prior to dressing change. Wound Cleanser Discharge Instruction: Cleanse the wound with wound cleanser prior to applying a clean dressing using gauze sponges, not tissue or cotton balls. Peri-Wound Care Triamcinolone 15 (g) Discharge Instruction: In clinic only. Use triamcinolone 15 (g) in clinic only. Sween Lotion (Moisturizing lotion) Discharge Instruction: Apply moisturizing lotion as directed Topical Primary Dressing KerraCel Ag Gelling Fiber Dressing, 4x5 in (silver alginate) Discharge Instruction: Apply silver alginate to wound bed as instructed Secondary Dressing Woven Gauze Sponge, Non-Sterile 4x4 in Discharge Instruction: Apply over primary dressing as directed. ABD Pad, 8x10 Discharge Instruction: Apply over primary dressing as directed. Secured With Compression Wrap ThreePress (3 layer compression wrap) Discharge Instruction: Apply three layer compression as directed. Compression Stockings Add-Ons Electronic Signature(s) Signed: 11/01/2020 4:48:42 PM By: Baruch Gouty RN, BSN Signed: 11/01/2020  5:46:14 PM By: Deon Pilling RN, BSN Entered By: Deon Pilling on 11/01/2020 14:03:09 -------------------------------------------------------------------------------- Wound Assessment Details Patient Name: Date of Service: [name], [place]. 11/01/2020 1:30 PM Medical Record Number: [id number] Patient Account Number: [id number] Date of Birth/Sex: Treating RN: [birth year] (87 y.o. Elam Dutch Primary Care Tyge Somers: [name] Other Clinician: Referring Yoshiaki Kreuser: Treating Horice Carrero/Extender: Thayer Headings in Treatment: 20 Wound Status Wound Number: 9 Primary Venous Leg Ulcer Etiology: Wound Location: Left, Posterior Lower Leg Wound Open Wounding Event: Gradually Appeared Status: Date Acquired: 10/04/2020 Comorbid Anemia, Arrhythmia, Congestive Heart Failure, Hypertension, Weeks Of Treatment: 4 History: Colitis, Osteoarthritis Clustered Wound: No Photos Wound Measurements Length: (cm) 2 Width: (cm) 2 Depth: (cm) 0.1 Area: (cm) 3.142 Volume: (cm) 0.314 % Reduction in Area: -1571.3% % Reduction in Volume: -1552.6% Epithelialization: Medium (34-66%) Tunneling: No Undermining: No Wound Description Classification: Full Thickness Without Exposed Support Structu Wound Margin: Flat and Intact Exudate Amount: Medium Exudate Type: Serous Exudate Color: amber res Wound Bed Granulation Amount: Large (67-100%) Exposed Structure Granulation Quality: Red Fascia Exposed: No Necrotic Amount: None Present (0%) Fat Layer (Subcutaneous Tissue) Exposed: Yes Tendon Exposed: No [name] Exposed: No Joint Exposed: No Bone Exposed: No Treatment Notes Wound #9 (Lower Leg) Wound Laterality: Left, Posterior Cleanser Soap and Water Discharge Instruction: May shower and wash wound with dial antibacterial soap and water prior to dressing change. Wound Cleanser Discharge Instruction: Cleanse the wound with wound cleanser prior to applying a clean  dressing using gauze sponges, not tissue or cotton balls. Peri-Wound Care Triamcinolone 15 (g) Discharge Instruction: In clinic only. Use triamcinolone 15 (g) in clinic only. Sween Lotion (Moisturizing lotion) Discharge Instruction: Apply moisturizing lotion as directed Topical Primary Dressing KerraCel Ag Gelling Fiber Dressing, 4x5 in (silver alginate) Discharge Instruction: Apply silver alginate to wound bed as instructed Secondary Dressing Woven Gauze Sponge, Non-Sterile 4x4 in Discharge Instruction: Apply over primary dressing as directed. ABD Pad, 8x10 Discharge Instruction: Apply over primary dressing as directed. Secured With Compression Wrap ThreePress (3 layer compression wrap) Discharge Instruction: Apply three layer compression as directed. Compression Stockings Add-Ons Electronic Signature(s) Signed: 11/01/2020 4:48:42 PM By: Baruch Gouty RN, BSN Signed: 11/01/2020 5:46:14 PM By: Deon Pilling RN, BSN Entered By: Deon Pilling on 11/01/2020 14:03:48 -------------------------------------------------------------------------------- Vitals Details Patient Name: Date of Service: [name], [name] 11/01/2020 1:30 PM Medical Record Number: [id number] Patient Account Number: [id number] Date of Birth/Sex: Treating RN: 1925-02-28 (87 y.o. Elam Dutch Primary Care Miyu Fenderson: [name] Other Clinician: Referring Carl Bleecker: Treating Reza Crymes/Extender: Thayer Headings in Treatment: 20 Vital Signs Time Taken: 13:44 Temperature (F): 98.4 Height (in): 65 Pulse (bpm): 73 Source: Stated Respiratory Rate (breaths/min): 18 Weight (lbs): 163 Blood Pressure (mmHg): 116/68 Source: Stated Reference Range: 80 - 120 mg / dl Body Mass Index (BMI): 27.1 Electronic Signature(s) Signed: 11/01/2020 4:48:42 PM By: Baruch Gouty RN, BSN Entered By: Baruch Gouty on 11/01/2020 13:45:35

## 2020-11-05 ENCOUNTER — Telehealth: Payer: Self-pay | Admitting: Internal Medicine

## 2020-11-05 MED ORDER — HYDROCORTISONE 2.5 % EX LOTN: TOPICAL_LOTION | Freq: Three times a day (TID) | CUTANEOUS | 2 refills | Status: DC

## 2020-11-05 MED ORDER — METHYLPREDNISOLONE 4 MG PO TBPK: ORAL_TABLET | ORAL | 0 refills | Status: DC

## 2020-11-05 NOTE — Telephone Encounter (Signed)
Medrol DosePack prescription emailed. Clobetasol lotion was substituted - not covered by her insurance.  Hydrocortisone lotion prescription emailed instead. Thanks

## 2020-11-05 NOTE — Telephone Encounter (Signed)
Daughter calling in on behalf of patient  Says she does not know why patient declined medication at visit but patient does in fact need those medications  Please send Medrol Pack & Clobetasol Lotion that was previously offered at visit to pharmacy:  [facility] [place] [employer]. [place]  Phone:  [phone] Fax:  [phone]

## 2020-11-06 ENCOUNTER — Other Ambulatory Visit: Payer: Self-pay | Admitting: Internal Medicine

## 2020-11-06 MED ORDER — HYDROCORTISONE 2.5 % EX LOTN: TOPICAL_LOTION | Freq: Three times a day (TID) | CUTANEOUS | 2 refills | Status: DC

## 2020-11-06 MED ORDER — METHYLPREDNISOLONE 4 MG PO TBPK: ORAL_TABLET | ORAL | 0 refills | Status: DC

## 2020-11-06 NOTE — Telephone Encounter (Signed)
Notified daughter MD sent rx to [facility]. Resent to correct pharmacy. Pt use [facility].Marland KitchenJohny Chess

## 2020-11-07 ENCOUNTER — Inpatient Hospital Stay (HOSPITAL_BASED_OUTPATIENT_CLINIC_OR_DEPARTMENT_OTHER): Payer: Medicare Other | Admitting: Oncology

## 2020-11-07 ENCOUNTER — Inpatient Hospital Stay: Payer: Medicare Other | Admitting: Dietician

## 2020-11-07 ENCOUNTER — Inpatient Hospital Stay: Payer: Medicare Other

## 2020-11-07 ENCOUNTER — Inpatient Hospital Stay: Payer: Medicare Other | Attending: Oncology

## 2020-11-07 ENCOUNTER — Other Ambulatory Visit: Payer: Self-pay

## 2020-11-07 VITALS — BP 151/69 | HR 79 | Temp 97.5°F | Resp 18 | Ht 65.0 in | Wt 156.4 lb

## 2020-11-07 DIAGNOSIS — Z170 Estrogen receptor positive status [ER+]: Secondary | ICD-10-CM | POA: Diagnosis not present

## 2020-11-07 DIAGNOSIS — Z853 Personal history of malignant neoplasm of breast: Secondary | ICD-10-CM | POA: Insufficient documentation

## 2020-11-07 DIAGNOSIS — C44722 Squamous cell carcinoma of skin of right lower limb, including hip: Secondary | ICD-10-CM | POA: Insufficient documentation

## 2020-11-07 DIAGNOSIS — C44721 Squamous cell carcinoma of skin of unspecified lower limb, including hip: Secondary | ICD-10-CM | POA: Diagnosis not present

## 2020-11-07 DIAGNOSIS — C50912 Malignant neoplasm of unspecified site of left female breast: Secondary | ICD-10-CM

## 2020-11-07 DIAGNOSIS — C4490 Unspecified malignant neoplasm of skin, unspecified: Secondary | ICD-10-CM

## 2020-11-07 DIAGNOSIS — C44729 Squamous cell carcinoma of skin of left lower limb, including hip: Secondary | ICD-10-CM | POA: Insufficient documentation

## 2020-11-07 DIAGNOSIS — Z79899 Other long term (current) drug therapy: Secondary | ICD-10-CM | POA: Insufficient documentation

## 2020-11-07 DIAGNOSIS — E064 Drug-induced thyroiditis: Secondary | ICD-10-CM

## 2020-11-07 DIAGNOSIS — Z85828 Personal history of other malignant neoplasm of skin: Secondary | ICD-10-CM | POA: Diagnosis not present

## 2020-11-07 DIAGNOSIS — C50412 Malignant neoplasm of upper-outer quadrant of left female breast: Secondary | ICD-10-CM

## 2020-11-07 DIAGNOSIS — D485 Neoplasm of uncertain behavior of skin: Secondary | ICD-10-CM | POA: Diagnosis not present

## 2020-11-07 DIAGNOSIS — L433 Subacute (active) lichen planus: Secondary | ICD-10-CM | POA: Diagnosis not present

## 2020-11-07 DIAGNOSIS — C44622 Squamous cell carcinoma of skin of right upper limb, including shoulder: Secondary | ICD-10-CM | POA: Diagnosis not present

## 2020-11-07 LAB — CBC WITH DIFFERENTIAL/PLATELET
Abs Immature Granulocytes: 0.02 10*3/uL (ref 0.00–0.07)
Basophils Absolute: 0 10*3/uL (ref 0.0–0.1)
Basophils Relative: 0 %
Eosinophils Absolute: 0 10*3/uL (ref 0.0–0.5)
Eosinophils Relative: 0 %
HCT: 33.6 % — ABNORMAL LOW (ref 36.0–46.0)
Hemoglobin: 11.3 g/dL — ABNORMAL LOW (ref 12.0–15.0)
Immature Granulocytes: 0 %
Lymphocytes Relative: 8 %
Lymphs Abs: 0.5 10*3/uL — ABNORMAL LOW (ref 0.7–4.0)
MCH: 34.8 pg — ABNORMAL HIGH (ref 26.0–34.0)
MCHC: 33.6 g/dL (ref 30.0–36.0)
MCV: 103.4 fL — ABNORMAL HIGH (ref 80.0–100.0)
Monocytes Absolute: 0.3 10*3/uL (ref 0.1–1.0)
Monocytes Relative: 5 %
Neutro Abs: 5.3 10*3/uL (ref 1.7–7.7)
Neutrophils Relative %: 87 %
Platelets: 245 10*3/uL (ref 150–400)
RBC: 3.25 MIL/uL — ABNORMAL LOW (ref 3.87–5.11)
RDW: 12.9 % (ref 11.5–15.5)
WBC: 6 10*3/uL (ref 4.0–10.5)
nRBC: 0 % (ref 0.0–0.2)

## 2020-11-07 LAB — COMPREHENSIVE METABOLIC PANEL
ALT: 18 U/L (ref 0–44)
AST: 22 U/L (ref 15–41)
Albumin: 3.3 g/dL — ABNORMAL LOW (ref 3.5–5.0)
Alkaline Phosphatase: 81 U/L (ref 38–126)
Anion gap: 12 (ref 5–15)
BUN: 16 mg/dL (ref 8–23)
CO2: 23 mmol/L (ref 22–32)
Calcium: 9.6 mg/dL (ref 8.9–10.3)
Chloride: 101 mmol/L (ref 98–111)
Creatinine, Ser: 0.86 mg/dL (ref 0.44–1.00)
GFR, Estimated: >60 mL/min (ref >60)
Glucose, Bld: 183 mg/dL — ABNORMAL HIGH (ref 70–99)
Potassium: 4.5 mmol/L (ref 3.5–5.1)
Sodium: 136 mmol/L (ref 135–145)
Total Bilirubin: 0.5 mg/dL (ref 0.3–1.2)
Total Protein: 7.8 g/dL (ref 6.5–8.1)

## 2020-11-07 LAB — TSH: TSH: 0.268 u[IU]/mL — ABNORMAL LOW (ref 0.308–3.960)

## 2020-11-07 NOTE — Progress Notes (Signed)
Nutrition  Patient did not receive treatment today. Unable to complete nutrition assessment during infusion. Will reschedule with treatment as able.

## 2020-11-07 NOTE — Progress Notes (Addendum)
[name]  [phone] [phone]) [phone]    ID: [name] DOB: 24-Oct-1925  MR#: [id number]  XQK#:[id number]  Patient Care Team: [name] Anger, MD as PCP - General [name] Form Annita Brod, MD as PCP - Cardiology (Cardiology) [name] Bookbinder, MD as Consulting Physician (General Surgery) [name], [name] Dad, MD as Consulting Physician (Oncology) [name] Blanks, MD as Consulting Physician (Cardiology) [name] Matin, MD as Consulting Physician (Dermatology) [name] Sa Tonna Corner, MD as Consulting Physician (Orthopedic Surgery) [name] Sprang, MD as Consulting Physician (Neurology) [name] Gibson, MD as Attending Physician (Radiation Oncology) [name] Party, DO as Consulting Physician (Internal Medicine) OTHER MD:   CHIEF COMPLAINT: skin cancer; estrogen receptor positive invasive breast cancer  CURRENT TREATMENT: [cemiplimab/ Libtayo]   INTERVAL HISTORY: [name] returns today for follow-up of her skin cancer.  She is accompanied by her daughter [name], who is visiting currently from [place] began systemic therapy with cemiplimab on 07/04/2020 for her bilateral lower extremity squamous cell carcinoma. She had a good response and per the wound care physicians who closely follow her wounds the cancer has regressed and is essentially not apparent at present in the lower extremities.  Of course she has chronic venous stasis issues which are continuing  The last mammogram we have records for is from July 2019 at [facility] showing a density category A.  [name] tells me there has been too much on her plate and she has not wanted to proceed to mammography at this point.   REVIEW OF SYSTEMS: [name] lives in [place] and spends most of the afternoon with her husband who is in a separate section.  Appointments here accordingly should be ideally late morning, around 11 AM.  She tolerated the cemiplimab with diarrhea as her primary side  effect.  She learned how to manage this and did quite well but more recently she has developed a "total body rash" and we held her treatment on 10/17/2020.  She has accordingly not been treated in the last 6 weeks and yet her rash is worse.  She just saw her primary care physician Dr. Alain Bell and he started her on a Medrol Dosepak yesterday.  She thinks perhaps there is a little bit of improvement already.  A detailed review of systems was otherwise stable   COVID 19 VACCINATION STATUS: fully vaccinated [employer]), with booster 10/2019   BREAST CANCER HISTORY: From the original intake note:  [name] noted a change in her left breast and had bilateral screening mammography at [facility] 06/07/2015, with tomosynthesis. Breast density was category A. There was a new oval mass in the left breast central to the nipple. Left breast ultrasound the same day confirmed a 3.4 cm oval mass in the left breast upper outer quadrant, correlating with the mammography. The left axilla was sonographically benign.  Biopsy of the left breast mass in question 06/11/2015 showed ([id number]) a papillary lesion, consistent with an intraductal papilloma, but due to fragmentation the edges of the lesion could not be assessed.  Accordingly the patient was referred to surgery, and after appropriate discussion underwent left lumpectomy 07/25/2015. The final pathology ([id number]) showed 2 foci of invasive ductal carcinoma, grade 2, measuring 3.0 cm, and grade 1, measuring 0.9 cm. There was also ductal carcinoma in situ. The invasive tumor was present at the superior margin, and ductal carcinoma in situ was focally less than 0.1 cm to the same margin. There was evidence of lymphovascular invasion. The prognostic panel showed estrogen receptor  100% positive, progesterone receptor 100% positive, both with strong staining intensity, for both lesions. HER-2 was also negative for both lesions, the signals ratio being 1.45-1.65, and the number per  cell 2.10-2.80.  The patient's subsequent history is as detailed below   PAST MEDICAL HISTORY: Past Medical History:  Diagnosis Date   Anxiety    denies   Atrial flutter ([place])    Bilateral edema of lower extremity    Breast cancer ([place])    left breast   Chronic constipation    Chronic diastolic CHF (congestive heart failure) (HCC)    Diverticulosis of colon    GERD (gastroesophageal reflux disease)    H/O hiatal hernia    History of cellulitis    LEFT LOWER LEG --  SEPT 2015   History of colitis    DX  2003  WITH ISCHEMIC COLITIS   History of diverticulitis of colon    perferated diverticulitis w/ abscess 08-25-2013 drain placed --  resolved without surgical intervention   History of GI bleed    2011--- UPPER SECONARY GASTRIC ULCER FROM NSAID USE   History of Helicobacter pylori infection    2011   History of ulcer of lower limb    2012   RIGHT LOWER EXTREMITIY--  STATSIS ULCER   Hyperlipidemia    Hypertension    Hypothyroidism    Iron deficiency anemia 2011   elevated MCV   LBP (low back pain)    severe lumbar spondylosis s/p L3/L4,L4/L5 fusion 12/10   Lumbar spondylolysis    OA (osteoarthritis)    "hands" (08/25/2013)   Open wound of left lower leg    Osteopenia    Persistent atrial fibrillation ([place])    CARDIOLOGIST-   DR MCALANY   RBBB    Urinary frequency     PAST SURGICAL HISTORY: Past Surgical History:  Procedure Laterality Date   APPLICATION OF A-CELL OF EXTREMITY Left 06/21/2013   Procedure: APPLICATION OF A-CELL OF EXTREMITY;  Surgeon: Theodoro Kos, DO;  Location: [place];  Service: Plastics;  Laterality: Left;   APPLICATION OF A-CELL OF EXTREMITY Left 08/22/2013   Procedure: APPLICATION OF A-CELL/VAC TO LOWER LEFT LEG WOUND;  Surgeon: Theodoro Kos, DO;  Location: [place];  Service: Plastics;  Laterality: Left;   APPLICATION OF A-CELL OF EXTREMITY Left 02/06/2014   Procedure: WITH PLACEMENT OF A -CELL ;  Surgeon: Theodoro Kos, DO;   Location: [place];  Service: Plastics;  Laterality: Left;   APPLICATION OF A-CELL OF EXTREMITY Left 08/09/2014   Procedure: PLACEMENT OF A CELL;  Surgeon: Theodoro Kos, DO;  Location: [place];  Service: Plastics;  Laterality: Left;   APPLICATION OF WOUND VAC Left 06/16/2013   Procedure: APPLICATION OF WOUND VAC;  Surgeon: Meredith Pel, MD;  Location: [place];  Service: Orthopedics;  Laterality: Left;   APPLICATION OF WOUND VAC Left 06/21/2013   Procedure: APPLICATION OF WOUND VAC;  Surgeon: Theodoro Kos, DO;  Location: [place];  Service: Plastics;  Laterality: Left;   BIOPSY BREAST  07/25/2015   LEFT RADIOACTIVE SEED GUIDED EXCISIONAL BREAST BIOPSY (Left) as a surgical intervention   EXTERNAL FIXATION LEG Left 06/16/2013   Procedure: EXTERNAL FIXATION LEG;  Surgeon: Meredith Pel, MD;  Location: [place];  Service: Orthopedics;  Laterality: Left;   EXTERNAL FIXATION REMOVAL Left 06/21/2013   Procedure: REMOVAL EXTERNAL FIXATION LEG;  Surgeon: Meredith Pel, MD;  Location: [place];  Service: Orthopedics;  Laterality: Left;   EYE SURGERY  Bilateral    cataract removal   HARDWARE REMOVAL Left 05/23/2014   Procedure: HARDWARE REMOVAL;  Surgeon: Meredith Pel, MD;  Location: [place];  Service: Orthopedics;  Laterality: Left;  REMOVAL OF HARDWARE LEFT TIBIA   I & D EXTREMITY Left 06/16/2013   Procedure: IRRIGATION AND DEBRIDEMENT EXTREMITY;  Surgeon: Meredith Pel, MD;  Location: [place];  Service: Orthopedics;  Laterality: Left;   I & D EXTREMITY Left 02/06/2014   Procedure: IRRIGATION AND DEBRIDEMENT LEFT LEG WOUND ;  Surgeon: Theodoro Kos, DO;  Location: [place];  Service: Plastics;  Laterality: Left;   I & D EXTREMITY Left 08/09/2014   Procedure: IRRIGATION AND DEBRIDEMENT  OF LEFT LOWER LEG;  Surgeon: Theodoro Kos, DO;  Location: [place];  Service: Plastics;  Laterality: Left;   LUMBAR FUSION  01-02-2009   L3-L5   RADIOACTIVE SEED GUIDED EXCISIONAL BREAST  BIOPSY Left 07/25/2015   Procedure: LEFT RADIOACTIVE SEED GUIDED EXCISIONAL BREAST BIOPSY;  Surgeon: [name] Bookbinder, MD;  Location: [place];  Service: General;  Laterality: Left;   RE-EXCISION OF BREAST LUMPECTOMY Left 09/05/2015   Procedure: RE-EXCISION OF LEFT BREAST LUMPECTOMY;  Surgeon: [name] Bookbinder, MD;  Location: [place];  Service: General;  Laterality: Left;   SHOULDER OPEN ROTATOR CUFF REPAIR Left 1997   TIBIA IM NAIL INSERTION Left 06/21/2013   Procedure: INTRAMEDULLARY (IM) NAIL TIBIAL;  Surgeon: Meredith Pel, MD;  Location: [place];  Service: Orthopedics;  Laterality: Left;   TONSILLECTOMY  AS CHILD   TOTAL HIP ARTHROPLASTY Right 06-07-2010   TRANSTHORACIC ECHOCARDIOGRAM  09-11-2010   DR MCALHANY   LVSF  55-60%/  MILD MV CALCIFICATION WITH NO STENOSIS/  MILD MR & TR / MODERATE LAE/  MODERATE TO SEVERE RAE   UPPER GI ENDOSCOPY  03-29-2010    FAMILY HISTORY Family History  Problem Relation Age of Onset   Cancer Father    Hypertension Other   The patient's father died at the age of 65 from heart problems. The patient's mother died at the age of 44 with a ruptured aneurysm. The patient had no brothers, 1 sister. The patient's granddaughter [name] according to the patient has recurrent breast cancer involving bone and lymph nodes.   GYNECOLOGIC HISTORY:  No LMP recorded. Patient is postmenopausal. Menarche age 58, first live birth age 89. The patient is GX P2. She stopped having periods in her early 17s. She did not take hormone replacement. She used oral contraceptives remotely for approximately 2 years, with no complications.   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 Her husband [name] was a Airline pilot.  According to the patient there are significant cognitive limitations on his side.  They currently live at [name] Daughter [name] lives in [place], near [place]. With her husband [name]. Daughter [name] lives in [place]. The  patient has 2 grandchildren and 4 great-grandchildren. She is a Psychologist, forensic.   ADVANCED DIRECTIVES: The patient has a living will in place and she has named her granddaughter [name] as her healthcare part of attorney. [name] can be reached at [place]: Social History   Tobacco Use   Smoking status: Never   Smokeless tobacco: Never  Vaping Use   Vaping Use: Never used  Substance Use Topics   Alcohol use: No    Comment: 1 glass occ wine 08/03/14- none in a year   Drug use: No     Colonoscopy:  Laurence Spates, remote  PAP:  Bone density:   Allergies  Allergen Reactions   Zosyn [Piperacillin Sod-Tazobactam So] Hives, Itching, Rash and Other (See Comments)    Morbilliform eruptions with itching- looks like Measles   Atorvastatin     myalgias   Atenolol Nausea And Vomiting   Clarithromycin Nausea And Vomiting   Codeine Sulfate Nausea Only   Levaquin [Levofloxacin] Nausea Only   Macrodantin Other (See Comments)    UNSPECIFIED    Oxycodone-Acetaminophen Nausea And Vomiting    Current Outpatient Medications  Medication Sig Dispense Refill   acetaminophen (TYLENOL) 500 MG tablet Take 500 mg by mouth daily as needed for mild pain.      apixaban (ELIQUIS) 5 MG TABS tablet Take 1 tablet (5 mg total) by mouth 2 (two) times daily. 180 tablet 3   B Complex-C (B-COMPLEX WITH VITAMIN C) tablet Take 1 tablet by mouth daily.     busPIRone (BUSPAR) 15 MG tablet TAKE 1 TABLET BY MOUTH TWICE DAILY 180 tablet 3   Cholecalciferol (VITAMIN D3) 1000 UNITS tablet Take 1,000 Units by mouth daily.     colestipol (COLESTID) 1 g tablet Take 1 tablet (1 g total) by mouth in the morning. 60 tablet 12   diltiazem (CARDIZEM CD) 120 MG 24 hr capsule TAKE 1 CAPSULE (120 MG TOTAL) BY MOUTH DAILY. 90 capsule 3   doxycycline (VIBRA-TABS) 100 MG tablet TAKE 1 TABLET BY MOUTH ONCE DAILY 30 tablet 11   famotidine (PEPCID) 20 MG tablet Take 20 mg by mouth daily.     furosemide (LASIX) 40 MG  tablet Take 1 tablet (40 mg total) by mouth daily as needed. 30 tablet 5   gabapentin (NEURONTIN) 100 MG capsule Take 1 capsule (100 mg total) by mouth at bedtime. Pain 90 capsule 3   hydrocortisone 2.5 % lotion Apply topically 3 (three) times daily. 240 mL 2   levothyroxine (SYNTHROID) 25 MCG tablet TAKE 1 TABLET BY MOUTH ONCE DAILY FOR THYROID *TAKE ON AN EMPTY STOMACH* 30 tablet 11   loperamide (IMODIUM) 2 MG capsule Take 1 capsule (2 mg total) by mouth as needed for diarrhea or loose stools. Take 1 tab with first loose stool then one tab after each loose stool for maximum dose of 8 tabs daily. Pt to call the office if need for greater then 8 tabs a day 60 capsule 0   methylPREDNISolone (MEDROL DOSEPAK) 4 MG TBPK tablet As directed 21 tablet 0   metoprolol succinate (TOPROL-XL) 50 MG 24 hr tablet TAKE 1/2 TABLET = 25 MG TWICE DAILY. TAKE WITH OR IMMEDIATELY FOLLOWING A MEAL 30 tablet 11   Multiple Vitamins-Minerals (MULTIVITAMIN WITH MINERALS) tablet Take 1 tablet by mouth daily.     polyethylene glycol (MIRALAX) 17 g packet Take 17 g by mouth daily. 30 each 0   prochlorperazine (COMPAZINE) 5 MG tablet Take 1 tablet (5 mg total) by mouth every 8 (eight) hours as needed for nausea or vomiting. 20 tablet 1   temazepam (RESTORIL) 15 MG capsule TAKE 1 CAPSULE BY MOUTH EVERY NIGHT AT BEDTIME AS NEEDED FOR SLEEP 60 capsule 3   No current facility-administered medications for this visit.    OBJECTIVE: White woman who appears stated age  1:   11/07/20 1359  BP: (!) 151/69  Pulse: 79  Resp: 18  Temp: (!) 97.5 F (36.4 C)  SpO2: 98%     Body mass index is 26.03 kg/m.     ECOG FS:1 - Symptomatic but completely ambulatory  The patient has a well demarcated palpable erythematous itchy rash which is imaged below (11/07/2020).     08/09/20 wound care center appointment:       06/20/2020 [place] appointment    LAB RESULTS:  CMP     Component Value Date/Time   NA  136 11/07/2020 1353   NA 134 08/11/2018 1425   NA 137 07/28/2016 1334   K 4.5 11/07/2020 1353   K 3.8 07/28/2016 1334   CL 101 11/07/2020 1353   CO2 23 11/07/2020 1353   CO2 28 07/28/2016 1334   GLUCOSE 183 (H) 11/07/2020 1353   GLUCOSE 160 (H) 07/28/2016 1334   BUN 16 11/07/2020 1353   BUN 20 08/11/2018 1425   BUN 9.5 07/28/2016 1334   CREATININE 0.86 11/07/2020 1353   CREATININE 1.0 07/28/2016 1334   CALCIUM 9.6 11/07/2020 1353   CALCIUM 9.6 07/28/2016 1334   PROT 7.8 11/07/2020 1353   PROT 7.4 07/28/2016 1334   ALBUMIN 3.3 (L) 11/07/2020 1353   ALBUMIN 4.0 07/28/2016 1334   AST 22 11/07/2020 1353   AST 21 07/28/2016 1334   ALT 18 11/07/2020 1353   ALT 24 07/28/2016 1334   ALKPHOS 81 11/07/2020 1353   ALKPHOS 96 07/28/2016 1334   BILITOT 0.5 11/07/2020 1353   BILITOT 1.21 (H) 07/28/2016 1334   GFRNONAA >60 11/07/2020 1353   GFRAA 50 (L) 10/03/2019 1355    INo results found for: SPEP, UPEP  Lab Results  Component Value Date   WBC 6.0 11/07/2020   NEUTROABS 5.3 11/07/2020   HGB 11.3 (L) 11/07/2020   HCT 33.6 (L) 11/07/2020   MCV 103.4 (H) 11/07/2020   PLT 245 11/07/2020      Chemistry      Component Value Date/Time   NA 136 11/07/2020 1353   NA 134 08/11/2018 1425   NA 137 07/28/2016 1334   K 4.5 11/07/2020 1353   K 3.8 07/28/2016 1334   CL 101 11/07/2020 1353   CO2 23 11/07/2020 1353   CO2 28 07/28/2016 1334   BUN 16 11/07/2020 1353   BUN 20 08/11/2018 1425   BUN 9.5 07/28/2016 1334   CREATININE 0.86 11/07/2020 1353   CREATININE 1.0 07/28/2016 1334      Component Value Date/Time   CALCIUM 9.6 11/07/2020 1353   CALCIUM 9.6 07/28/2016 1334   ALKPHOS 81 11/07/2020 1353   ALKPHOS 96 07/28/2016 1334   AST 22 11/07/2020 1353   AST 21 07/28/2016 1334   ALT 18 11/07/2020 1353   ALT 24 07/28/2016 1334   BILITOT 0.5 11/07/2020 1353   BILITOT 1.21 (H) 07/28/2016 1334       No results found for: LABCA2  No components found for: LABCA125  No  results for input(s): INR in the last 168 hours.  Urinalysis    Component Value Date/Time   COLORURINE YELLOW 10/07/2018 1535   APPEARANCEUR CLEAR 10/07/2018 1535   LABSPEC 1.012 10/07/2018 1535   PHURINE 6.0 10/07/2018 1535   GLUCOSEU NEGATIVE 10/07/2018 1535   GLUCOSEU NEGATIVE 01/17/2015 1415   HGBUR NEGATIVE 10/07/2018 [place] 10/07/2018 1535   KETONESUR NEGATIVE 10/07/2018 1535   PROTEINUR 30 (A) 10/07/2018 1535   UROBILINOGEN 0.2 01/17/2015 1415   NITRITE NEGATIVE 10/07/2018 1535   LEUKOCYTESUR NEGATIVE 10/07/2018 1535    STUDIES: No results found.   ELIGIBLE FOR AVAILABLE RESEARCH PROTOCOL: no  ASSESSMENT: 87 y.o. [place] woman status post left breast upper outer quadrant biopsy 06/11/2015 for a papillary  lesion with unclear margins.  (1) status post left lumpectomy 07/25/2015 for an mpT2 cN0, stage IIA  invasive ductal carcinoma,  grade I-II  estrogen and progesterone receptor strongly positive, HER-2 not amplified   (a) additional surgery 09/05/2015 found some residual ductal carcinoma in situ but the margins were clear.  (2) no sentinel lymph node sampling or adjuvant radiation planned  (3) adjuvant anastrozole started 08/10/2015, completed May 2022  (4) squamous cell carcinoma of the skin in both lower extremities  (a) started cemiplimab/ Libtayo 07/04/2020, repeated every 21 days x 8  (b) cemiplimab held after 09/26/2020 dose with good response but concern regarding rash (c) consider cetuximab if Libtayo ineffective or not tolerated   PLAN:  [place] initially had some problems with the cemiplimab but after the first couple of doses she tolerated it quite well.  We stopped it in September because of concerns regarding a rash which we thought might possibly be related.  She has now been off treatment for 6 weeks and you would expect the rash to have improved if it were due to the drug.  Instead the rash became worse and more itchy.  Stated above  she was just started on a Medrol Dosepak by her primary care physician Dr. Alain Bell.  I think it would be useful if she saw her dermatologist Dr. Ronnald Ramp or one of his partners who could give [place] their opinion on this rash and perhaps obtain a biopsy.  At this point, having had a good response to treatment and giving the unresolved rash issue I think the prudent thing to do is to hold off on further cemiplimab.  I am going to contact [name] in about 6 weeks to see how she is doing and certainly if there is any evidence of squamous cell cancer recurring in her lower extremities we can go back to this drug.  Otherwise we would leave further follow-up here on an as-needed basis.  Incidentally that apparently there has been some insurance confusion regarding why we are treating this patient.  I have eliminated the breast cancer diagnosis from her list to prevent that confusion.  Total encounter time 25 minutes.*   [name], [name] Dad, MD  11/07/20 3:07 PM Medical Oncology and Hematology [facility] [place], [place] [address] Tel. [phone]    Fax. [phone]   I, Wilburn Mylar, am acting as scribe for Dr. Virgie Bell. [name].  I, Lurline Del MD, have reviewed the above documentation for accuracy and completeness, and I agree with the above.   *Total Encounter Time as defined by the Centers for Medicare and Medicaid Services includes, in addition to the face-to-face time of a patient visit (documented in the note above) non-face-to-face time: obtaining and reviewing outside history, ordering and reviewing medications, tests or procedures, care coordination (communications with other health care professionals or caregivers) and documentation in the medical record.

## 2020-11-08 ENCOUNTER — Encounter (HOSPITAL_BASED_OUTPATIENT_CLINIC_OR_DEPARTMENT_OTHER): Payer: Medicare Other | Admitting: Internal Medicine

## 2020-11-08 DIAGNOSIS — L97819 Non-pressure chronic ulcer of other part of right lower leg with unspecified severity: Secondary | ICD-10-CM | POA: Diagnosis not present

## 2020-11-08 DIAGNOSIS — C4492 Squamous cell carcinoma of skin, unspecified: Secondary | ICD-10-CM

## 2020-11-08 DIAGNOSIS — Z8616 Personal history of COVID-19: Secondary | ICD-10-CM | POA: Diagnosis not present

## 2020-11-08 DIAGNOSIS — I872 Venous insufficiency (chronic) (peripheral): Secondary | ICD-10-CM | POA: Diagnosis not present

## 2020-11-08 DIAGNOSIS — L97829 Non-pressure chronic ulcer of other part of left lower leg with unspecified severity: Secondary | ICD-10-CM | POA: Diagnosis not present

## 2020-11-08 DIAGNOSIS — Z853 Personal history of malignant neoplasm of breast: Secondary | ICD-10-CM | POA: Diagnosis not present

## 2020-11-08 DIAGNOSIS — Z85828 Personal history of other malignant neoplasm of skin: Secondary | ICD-10-CM | POA: Diagnosis not present

## 2020-11-13 NOTE — Progress Notes (Signed)
[name] ([id number]) , Visit Report for 11/08/2020 Arrival Information Details Patient Name: Date of Service: [name]. 11/08/2020 11:00 A M Medical Record Number: [id number] Patient Account Number: [id number] Date of Birth/Sex: Treating RN: 02/10/1925 (87 y.o. [name] Primary Care Aedan Geimer: [name] Other Clinician: Referring [name]: Treating Adonai Selsor/Extender: [name] in Treatment: 21 Visit Information History Since Last Visit Added or deleted any medications: No Patient Arrived: Walker Any new allergies or adverse reactions: No Arrival Time: 11:11 Had a fall or experienced change in No Accompanied By: self activities of daily living that may affect Transfer Assistance: None risk of falls: Patient Identification Verified: Yes Signs or symptoms of abuse/neglect since last visito No Secondary Verification Process Completed: Yes Hospitalized since last visit: No Patient Requires Transmission-Based No Implantable device outside of the clinic excluding No Precautions: cellular tissue based products placed in the center Patient Has Alerts: Yes since last visit: Patient Alerts: R ABI = Non compressible Has Dressing in Bell as Prescribed: Yes L ABI = Non compressible Pain Present Now: No Electronic Signature(s) Signed: 11/13/2020 2:37:05 PM By: Sandre Kitty Entered By: Sandre Kitty on 11/08/2020 11:16:30 -------------------------------------------------------------------------------- Compression Therapy Details Patient Name: Date of Service: [name] ETT, [name] 11/08/2020 11:00 A M Medical Record Number: [id number] Patient Account Number: [id number] Date of Birth/Sex: Treating RN: 05/12/1925 (87 y.o. [name] Primary Care Marce Schartz: [name] Other Clinician: Referring Shanie Mauzy: Treating Hailei Besser/Extender: [name] in Treatment:  21 Compression Therapy Performed for Wound Assessment: Wound #10 Left,Distal,Medial Lower Leg Performed By: Clinician Lorrin Jackson, RN Compression Type: Three Layer Post Procedure Diagnosis Same as Pre-procedure Electronic Signature(s) Signed: 11/08/2020 5:31:21 PM By: Lorrin Jackson Entered By: Lorrin Jackson on 11/08/2020 12:03:24 -------------------------------------------------------------------------------- Compression Therapy Details Patient Name: Date of Service: [name] ETT, [name]. 11/08/2020 11:00 A M Medical Record Number: [id number] Patient Account Number: [id number] Date of Birth/Sex: Treating RN: [birth year] (87 y.o. [name] Primary Care Tabor Denham: [name] Other Clinician: Referring Berley Gambrell: Treating Vrinda Heckstall/Extender: [name] in Treatment: 21 Compression Therapy Performed for Wound Assessment: Wound #11 Left,Proximal,Medial Lower Leg Performed By: Clinician Lorrin Jackson, RN Compression Type: Three Layer Post Procedure Diagnosis Same as Pre-procedure Electronic Signature(s) Signed: 11/08/2020 5:31:21 PM By: Lorrin Jackson Entered By: Lorrin Jackson on 11/08/2020 12:03:24 -------------------------------------------------------------------------------- Compression Therapy Details Patient Name: Date of Service: [name] ETT, [name]. 11/08/2020 11:00 A M Medical Record Number: [id number] Patient Account Number: [id number] Date of Birth/Sex: Treating RN: 02/07/1925 (87 y.o. [name] Primary Care Awa Bachicha: [name] Other Clinician: Referring Jacquilyn Seldon: Treating Shervin Cypert/Extender: [name] in Treatment: 21 Compression Therapy Performed for Wound Assessment: Wound #12 Left,Lateral Lower Leg Performed By: Clinician Lorrin Jackson, RN Compression Type: Three Layer Post Procedure Diagnosis Same as Pre-procedure Electronic Signature(s) Signed: 11/08/2020  5:31:21 PM By: Lorrin Jackson Entered By: Lorrin Jackson on 11/08/2020 12:03:24 -------------------------------------------------------------------------------- Compression Therapy Details Patient Name: Date of Service: [name] ETT, [name]. 11/08/2020 11:00 A M Medical Record Number: [id number] Patient Account Number: [id number] Date of Birth/Sex: Treating RN: October 08, 1925 (87 y.o. [name] Primary Care Xan Ingraham: [name] Other Clinician: Referring Jaxson Keener: Treating Chandel Zaun/Extender: [name] in Treatment: 21 Compression Therapy Performed for Wound Assessment: Wound #13 Right,Proximal,Lateral Lower Leg Performed By: Clinician Lorrin Jackson, RN Compression Type: Three Layer Post Procedure Diagnosis Same as Pre-procedure Electronic Signature(s) Signed: 11/08/2020 5:31:21 PM By: Lorrin Jackson Signed:  11/08/2020 5:31:21 PM By: Lorrin Jackson Entered By: Lorrin Jackson on 11/08/2020 12:03:24 -------------------------------------------------------------------------------- Compression Therapy Details Patient Name: Date of Service: [name] ETT, [name]. 11/08/2020 11:00 A M Medical Record Number: [id number] Patient Account Number: [id number] Date of Birth/Sex: Treating RN: 05/07/1925 (87 y.o. [name] Primary Care Mauriah Mcmillen: [name] Other Clinician: Referring Raneshia Derick: Treating Melany Wiesman/Extender: [name] in Treatment: 21 Compression Therapy Performed for Wound Assessment: Wound #14 Right,Medial Lower Leg Performed By: Clinician Lorrin Jackson, RN Compression Type: Three Layer Post Procedure Diagnosis Same as Pre-procedure Electronic Signature(s) Signed: 11/08/2020 5:31:21 PM By: Lorrin Jackson Entered By: Lorrin Jackson on 11/08/2020 12:03:24 -------------------------------------------------------------------------------- Compression Therapy Details Patient Name: Date of  Service: [name] ETT, [name]. 11/08/2020 11:00 A M Medical Record Number: [id number] Patient Account Number: [id number] Date of Birth/Sex: Treating RN: Aug 26, 1925 (87 y.o. [name] Primary Care Topaz Raglin: [name] Other Clinician: Referring Allona Gondek: Treating Latoya Diskin/Extender: [name] in Treatment: 21 Compression Therapy Performed for Wound Assessment: Wound #15 Right,Posterior Lower Leg Performed By: Clinician Lorrin Jackson, RN Compression Type: Three Layer Post Procedure Diagnosis Same as Pre-procedure Electronic Signature(s) Signed: 11/08/2020 5:31:21 PM By: Lorrin Jackson Entered By: Lorrin Jackson on 11/08/2020 12:03:24 -------------------------------------------------------------------------------- Compression Therapy Details Patient Name: Date of Service: [name] ETT, [place]. 11/08/2020 11:00 A M Medical Record Number: [id number] Patient Account Number: [id number] Date of Birth/Sex: Treating RN: 1925/10/19 (87 y.o. [name] Primary Care Arnold Kester: [name] Other Clinician: Referring Jasmond River: Treating Brandii Lakey/Extender: [name] in Treatment: 21 Compression Therapy Performed for Wound Assessment: Wound #16 Left,Proximal,Posterior Lower Leg Performed By: Clinician Lorrin Jackson, RN Compression Type: Three Layer Post Procedure Diagnosis Same as Pre-procedure Electronic Signature(s) Signed: 11/08/2020 5:31:21 PM By: Lorrin Jackson Entered By: Lorrin Jackson on 11/08/2020 12:03:24 -------------------------------------------------------------------------------- Compression Therapy Details Patient Name: Date of Service: [name] ETT, [place]. 11/08/2020 11:00 A M Medical Record Number: [id number] Patient Account Number: [id number] Date of Birth/Sex: Treating RN: Nov 09, 1925 (87 y.o. [name] Primary Care Chukwudi Ewen: [name] Other  Clinician: Referring Tawn Fitzner: Treating Atziry Baranski/Extender: [name] in Treatment: 21 Compression Therapy Performed for Wound Assessment: Wound #17 Left,Posterior Lower Leg Performed By: Clinician Lorrin Jackson, RN Compression Type: Three Layer Post Procedure Diagnosis Same as Pre-procedure Electronic Signature(s) Signed: 11/08/2020 5:31:21 PM By: Lorrin Jackson Entered By: Lorrin Jackson on 11/08/2020 12:03:24 -------------------------------------------------------------------------------- Compression Therapy Details Patient Name: Date of Service: [name] ETT, [place]. 11/08/2020 11:00 A M Medical Record Number: [id number] Patient Account Number: [id number] Date of Birth/Sex: Treating RN: Aug 03, 1925 (87 y.o. [name] Primary Care Ac Colan: [name] Other Clinician: Referring Lindy Pennisi: Treating Markeisha Mancias/Extender: [name] in Treatment: 21 Compression Therapy Performed for Wound Assessment: Wound #7R Left,Anterior Lower Leg Performed By: Clinician Lorrin Jackson, RN Compression Type: Three Layer Post Procedure Diagnosis Same as Pre-procedure Electronic Signature(s) Signed: 11/08/2020 5:31:21 PM By: Lorrin Jackson Entered By: Lorrin Jackson on 11/08/2020 12:03:24 -------------------------------------------------------------------------------- Compression Therapy Details Patient Name: Date of Service: [name] ETT, [place]. 11/08/2020 11:00 A M Medical Record Number: [id number] Patient Account Number: [id number] Date of Birth/Sex: Treating RN: 05-06-1925 (87 y.o. [name] Primary Care Izabela Ow: [name] Other Clinician: Referring Coston Mandato: Treating Chrisandra Wiemers/Extender: [name] in Treatment: 21 Compression Therapy Performed for Wound Assessment: Wound #8 Right,Lateral Lower Leg Performed By: Clinician Lorrin Jackson, RN Compression Type:  Three Layer Post Procedure Diagnosis Same as Pre-procedure  Electronic Signature(s) Signed: 11/08/2020 5:31:21 PM By: Lorrin Jackson Entered By: Lorrin Jackson on 11/08/2020 12:03:24 -------------------------------------------------------------------------------- Compression Therapy Details Patient Name: Date of Service: [name] ETT, [place]. 11/08/2020 11:00 A M Medical Record Number: [id number] Patient Account Number: [id number] Date of Birth/Sex: Treating RN: May 10, 1925 (87 y.o. [name] Primary Care Cavon Nicolls: [name] Other Clinician: Referring Eon Zunker: Treating Latessa Tillis/Extender: [name] in Treatment: 21 Compression Therapy Performed for Wound Assessment: Wound #9 Left,Distal,Posterior Lower Leg Performed By: Clinician Lorrin Jackson, RN Compression Type: Three Layer Post Procedure Diagnosis Same as Pre-procedure Electronic Signature(s) Signed: 11/08/2020 5:31:21 PM By: Lorrin Jackson Entered By: Lorrin Jackson on 11/08/2020 12:03:24 -------------------------------------------------------------------------------- Encounter Discharge Information Details Patient Name: Date of Service: [name] ETT, [place]. 11/08/2020 11:00 A M Medical Record Number: [id number] Patient Account Number: [id number] Date of Birth/Sex: Treating RN: Mar 06, 1925 (87 y.o. [name] Primary Care Tessla Spurling: [name] Other Clinician: Referring Ayannah Faddis: Treating Ziyanna Tolin/Extender: [name] in Treatment: 21 Encounter Discharge Information Items Discharge Condition: Stable Ambulatory Status: Walker Discharge Destination: Home Transportation: Private Auto Accompanied By: daughter Schedule Follow-up Appointment: Yes Clinical Summary of Care: Provided on 11/08/2020 Form Type Recipient Paper Patient Patient Electronic Signature(s) Signed: 11/08/2020 12:46:38 PM By: Lorrin Jackson Entered By:  Lorrin Jackson on 11/08/2020 12:46:38 -------------------------------------------------------------------------------- Lower Extremity Assessment Details Patient Name: Date of Service: [name] ETT, [place]. 11/08/2020 11:00 A M Medical Record Number: [id number] Patient Account Number: [id number] Date of Birth/Sex: Treating RN: 1925/09/03 (87 y.o. [name] Primary Care Paz Winsett: [name] Other Clinician: Referring Telesha Deguzman: Treating Jarah Pember/Extender: [name] in Treatment: 21 Edema Assessment Assessed: [Left: Yes] [Right: Yes] Edema: [Left: Yes] [Right: Yes] Calf Left: Right: Point of Measurement: 30 cm From Medial Instep 34.3 cm 32.5 cm Ankle Left: Right: Point of Measurement: 9 cm From Medial Instep 21.8 cm 22 cm Vascular Assessment Pulses: Dorsalis Pedis Palpable: [Left:Yes] [Right:Yes] Electronic Signature(s) Signed: 11/08/2020 5:31:21 PM By: Lorrin Jackson Entered By: Lorrin Jackson on 11/08/2020 11:40:37 -------------------------------------------------------------------------------- Multi Wound Chart Details Patient Name: Date of Service: [name] ETT, [place]. 11/08/2020 11:00 A M Medical Record Number: [id number] Patient Account Number: [id number] Date of Birth/Sex: Treating RN: 1925-03-03 (87 y.o. [name] Primary Care Walta [place]: [name] Other Clinician: Referring Valetta Mulroy: Treating Teria Khachatryan/Extender: [name] in Treatment: 21 Vital Signs Height(in): 23 Pulse(bpm): 12 Weight(lbs): 163 Blood Pressure(mmHg): 154/77 Body Mass Index(BMI): 27 Temperature(F): 98.4 Respiratory Rate(breaths/min): 18 Photos: Left, Distal, Medial Lower Leg Left, Proximal, Medial Lower Leg Left, Lateral Lower Leg Wound Location: Gradually Appeared Gradually Appeared Gradually Appeared Wounding Event: Venous Leg Ulcer Venous Leg Ulcer Venous Leg Ulcer Primary  Etiology: Anemia, Arrhythmia, Congestive Heart Anemia, Arrhythmia, Congestive Heart Anemia, Arrhythmia, Congestive Heart Comorbid History: Failure, Hypertension, Colitis, Failure, Hypertension, Colitis, Failure, Hypertension, Colitis, Osteoarthritis Osteoarthritis Osteoarthritis 10/11/2020 11/01/2020 11/01/2020 Date Acquired: 4 1 1  Weeks of Treatment: Open Open Open Wound Status: No No No Wound Recurrence: No Yes Yes Clustered Wound: N/A N/A 3 Clustered Quantity: 1.2x1.6x0.1 1.1x0.4x0.1 0.5x0.7x0.1 Measurements L x W x D (cm) 1.508 0.346 0.275 A (cm) : rea 0.151 0.035 0.027 Volume (cm) : -220.20% 99.00% 95.90% % Reduction in Area: -221.30% 98.90% 96.00% % Reduction in Volume: Full Thickness Without Exposed Full Thickness Without Exposed Full Thickness Without Exposed Classification: Support Structures Support Structures Support Structures Medium Medium Medium Exudate Amount: Serosanguineous Serous Serous Exudate Type: red, brown amber amber Exudate Color: Distinct, outline attached Distinct, outline  attached Distinct, outline attached Wound Margin: Large (67-100%) Large (67-100%) Large (67-100%) Granulation Amount: Red, Pink Red, Pink Red, Pink Granulation Quality: None Present (0%) None Present (0%) None Present (0%) Necrotic Amount: Fat Layer (Subcutaneous Tissue): Yes Fat Layer (Subcutaneous Tissue): Yes Fat Layer (Subcutaneous Tissue): Yes Exposed Structures: Fascia: No Fascia: No Fascia: No Tendon: No Tendon: No Tendon: No Muscle: No Muscle: No Muscle: No Joint: No Joint: No Joint: No Bone: No Bone: No Bone: No None Medium (34-66%) Medium (34-66%) Epithelialization: Compression Therapy Compression Therapy Compression Therapy Procedures Performed: Wound Number: [id number]  Photos: Right, Proximal, Lateral Lower Leg Right, Medial Lower Leg Right, Posterior Lower Leg Wound Location: Gradually Appeared Gradually Appeared Gradually  Appeared Wounding Event: Venous Leg Ulcer Venous Leg Ulcer Venous Leg Ulcer Primary Etiology: Anemia, Arrhythmia, Congestive Heart Anemia, Arrhythmia, Congestive Heart Anemia, Arrhythmia, Congestive Heart Comorbid History: Failure, Hypertension, Colitis, Failure, Hypertension, Colitis, Failure, Hypertension, Colitis, Osteoarthritis Osteoarthritis Osteoarthritis 11/01/2020 11/01/2020 11/08/2020 Date Acquired: 1 1 0 Weeks of Treatment: Open Open Open Wound Status: No No No Wound Recurrence: Yes Yes Yes Clustered Wound: 3 3 N/A Clustered Quantity: 0.3x0.3x0.1 0.1x0.1x0.1 2x2.5x0.1 Measurements L x W x D (cm) 0.071 0.008 3.927 A (cm) : rea 0.007 0.001 0.393 Volume (cm) : 98.90% 100.00% N/A % Reduction in Area: 99.00% 100.00% N/A % Reduction in Volume: Full Thickness Without Exposed Full Thickness Without Exposed Full Thickness Without Exposed Classification: Support Structures Support Structures Support Structures Medium Medium Medium Exudate Amount: Serous Serous Serosanguineous Exudate Type: amber amber red, brown Exudate Color: Distinct, outline attached Distinct, outline attached Distinct, outline attached Wound Margin: Large (67-100%) Large (67-100%) Large (67-100%) Granulation Amount: Red Red Red Granulation Quality: None Present (0%) None Present (0%) None Present (0%) Necrotic Amount: Fat Layer (Subcutaneous Tissue): Yes Fat Layer (Subcutaneous Tissue): Yes Fat Layer (Subcutaneous Tissue): Yes Exposed Structures: Fascia: No Fascia: No Fascia: No Tendon: No Tendon: No Tendon: No Muscle: No Muscle: No Muscle: No Joint: No Joint: No Joint: No Bone: No Bone: No Bone: No Large (67-100%) Medium (34-66%) N/A Epithelialization: Compression Therapy Compression Therapy Compression Therapy Procedures Performed: Wound Number: [id number] Photos: Left, Posterior Lower Leg Left, Posterior Lower Leg Left, Anterior Lower Leg Wound Location: Gradually  Appeared Gradually Appeared Blister Wounding Event: Venous Leg Ulcer Venous Leg Ulcer Malignant Wound Primary Etiology: Anemia, Arrhythmia, Congestive Heart Anemia, Arrhythmia, Congestive Heart Anemia, Arrhythmia, Congestive Heart Comorbid History: Failure, Hypertension, Colitis, Failure, Hypertension, Colitis, Failure, Hypertension, Colitis, Osteoarthritis Osteoarthritis Osteoarthritis 11/08/2020 11/08/2020 04/20/2020 Date Acquired: 0 0 21 Weeks of Treatment: Open Open Open Wound Status: No No Yes Wound Recurrence: No No No Clustered Wound: N/A N/A N/A Clustered Quantity: 1x0.8x0.1 1.5x1.2x0.1 0.4x0.9x0.1 Measurements L x W x D (cm) 0.628 1.414 0.283 A (cm) : rea 0.063 0.141 0.028 Volume (cm) : N/A N/A 90.80% % Reduction in Area: N/A N/A 90.90% % Reduction in Volume: Full Thickness Without Exposed Full Thickness Without Exposed Full Thickness Without Exposed Classification: Support Structures Support Structures Support Structures Medium Medium Medium Exudate Amount: Serosanguineous Serosanguineous Serosanguineous Exudate Type: red, brown red, brown red, brown Exudate Color: Distinct, outline attached Distinct, outline attached Distinct, outline attached Wound Margin: Large (67-100%) Large (67-100%) Large (67-100%) Granulation Amount: Red Red, Pink Red, Pink Granulation Quality: None Present (0%) None Present (0%) None Present (0%) Necrotic Amount: Fat Layer (Subcutaneous Tissue): Yes Fat Layer (Subcutaneous Tissue): Yes Fat Layer (Subcutaneous Tissue): Yes Exposed Structures: Fascia: No Fascia: No Fascia: No Tendon: No Tendon: No Tendon: No Muscle: No Muscle: No Muscle:  No Joint: No Joint: No Joint: No Bone: No Bone: No Bone: No None None Medium (34-66%) Epithelialization: Compression Therapy Compression Therapy Compression Therapy Procedures Performed: Wound Number: [id number] N/A Photos: N/A Right, Lateral Lower Leg Left, Distal, Posterior Lower Leg  N/A Wound Location: Gradually Appeared Gradually Appeared N/A Wounding Event: Venous Leg Ulcer Venous Leg Ulcer N/A Primary Etiology: Anemia, Arrhythmia, Congestive Heart Anemia, Arrhythmia, Congestive Heart N/A Comorbid History: Failure, Hypertension, Colitis, Failure, Hypertension, Colitis, Osteoarthritis Osteoarthritis 09/27/2020 10/04/2020 N/A Date Acquired: 6 5 N/A Weeks of Treatment: Open Open N/A Wound Status: No No N/A Wound Recurrence: No No N/A Clustered Wound: N/A N/A N/A Clustered Quantity: 0.6x1.1x0.1 5.2x3.7x0.1 N/A Measurements L x W x D (cm) 0.518 15.111 N/A A (cm) : rea 0.052 1.511 N/A Volume (cm) : -10.00% -7937.80% N/A % Reduction in Area: -10.60% -7852.60% N/A % Reduction in Volume: Full Thickness Without Exposed Full Thickness Without Exposed N/A Classification: Support Structures Support Structures Medium Medium N/A Exudate Amount: Serous Serosanguineous N/A Exudate Type: amber red, brown N/A Exudate Color: Distinct, outline attached Distinct, outline attached N/A Wound Margin: Large (67-100%) Large (67-100%) N/A Granulation Amount: Red, Pink Red, Pink N/A Granulation Quality: None Present (0%) None Present (0%) N/A Necrotic Amount: Fat Layer (Subcutaneous Tissue): Yes Fat Layer (Subcutaneous Tissue): Yes N/A Exposed Structures: Fascia: No Fascia: No Tendon: No Tendon: No Muscle: No Muscle: No Joint: No Joint: No Bone: No Bone: No Medium (34-66%) Medium (34-66%) N/A Epithelialization: Compression Therapy Compression Therapy N/A Procedures Performed: Treatment Notes Wound #10 (Lower Leg) Wound Laterality: Left, Medial, Distal Cleanser Soap and Water Discharge Instruction: May shower and wash wound with dial antibacterial soap and water prior to dressing change. Wound Cleanser Discharge Instruction: Cleanse the wound with wound cleanser prior to applying a clean dressing using gauze sponges, not tissue or cotton  balls. Peri-Wound Care Triamcinolone 15 (g) Discharge Instruction: In clinic only. Use triamcinolone 15 (g) in clinic only. Sween Lotion (Moisturizing lotion) Discharge Instruction: Apply moisturizing lotion as directed Topical Primary Dressing KerraCel Ag Gelling Fiber Dressing, 4x5 in (silver alginate) Discharge Instruction: Apply silver alginate to wound bed as instructed Secondary Dressing Woven Gauze Sponge, Non-Sterile 4x4 in Discharge Instruction: Apply over primary dressing as directed. ABD Pad, 8x10 Discharge Instruction: Apply over primary dressing as directed. Secured With Compression Wrap ThreePress (3 layer compression wrap) Discharge Instruction: Apply three layer compression as directed. Compression Stockings Add-Ons Wound #11 (Lower Leg) Wound Laterality: Left, Medial, Proximal Cleanser Soap and Water Discharge Instruction: May shower and wash wound with dial antibacterial soap and water prior to dressing change. Wound Cleanser Discharge Instruction: Cleanse the wound with wound cleanser prior to applying a clean dressing using gauze sponges, not tissue or cotton balls. Peri-Wound Care Triamcinolone 15 (g) Discharge Instruction: In clinic only. Use triamcinolone 15 (g) in clinic only. Sween Lotion (Moisturizing lotion) Discharge Instruction: Apply moisturizing lotion as directed Topical Primary Dressing KerraCel Ag Gelling Fiber Dressing, 4x5 in (silver alginate) Discharge Instruction: Apply silver alginate to wound bed as instructed Secondary Dressing Woven Gauze Sponge, Non-Sterile 4x4 in Discharge Instruction: Apply over primary dressing as directed. ABD Pad, 8x10 Discharge Instruction: Apply over primary dressing as directed. Secured With Compression Wrap ThreePress (3 layer compression wrap) Discharge Instruction: Apply three layer compression as directed. Compression Stockings Add-Ons Wound #12 (Lower Leg) Wound Laterality: Left,  Lateral Cleanser Soap and Water Discharge Instruction: May shower and wash wound with dial antibacterial soap and water prior to dressing change. Wound Cleanser Discharge Instruction: Cleanse the wound with  wound cleanser prior to applying a clean dressing using gauze sponges, not tissue or cotton balls. Peri-Wound Care Triamcinolone 15 (g) Discharge Instruction: In clinic only. Use triamcinolone 15 (g) in clinic only. Sween Lotion (Moisturizing lotion) Discharge Instruction: Apply moisturizing lotion as directed Topical Primary Dressing KerraCel Ag Gelling Fiber Dressing, 4x5 in (silver alginate) Discharge Instruction: Apply silver alginate to wound bed as instructed Secondary Dressing Woven Gauze Sponge, Non-Sterile 4x4 in Discharge Instruction: Apply over primary dressing as directed. ABD Pad, 8x10 Discharge Instruction: Apply over primary dressing as directed. Secured With Compression Wrap ThreePress (3 layer compression wrap) Discharge Instruction: Apply three layer compression as directed. Compression Stockings Add-Ons Wound #13 (Lower Leg) Wound Laterality: Right, Lateral, Proximal Cleanser Soap and Water Discharge Instruction: May shower and wash wound with dial antibacterial soap and water prior to dressing change. Wound Cleanser Discharge Instruction: Cleanse the wound with wound cleanser prior to applying a clean dressing using gauze sponges, not tissue or cotton balls. Peri-Wound Care Triamcinolone 15 (g) Discharge Instruction: In clinic only. Use triamcinolone 15 (g) in clinic only. Sween Lotion (Moisturizing lotion) Discharge Instruction: Apply moisturizing lotion as directed Topical Primary Dressing KerraCel Ag Gelling Fiber Dressing, 4x5 in (silver alginate) Discharge Instruction: Apply silver alginate to wound bed as instructed Secondary Dressing Woven Gauze Sponge, Non-Sterile 4x4 in Discharge Instruction: Apply over primary dressing as directed. ABD Pad,  8x10 Discharge Instruction: Apply over primary dressing as directed. Secured With Compression Wrap ThreePress (3 layer compression wrap) Discharge Instruction: Apply three layer compression as directed. Compression Stockings Add-Ons Wound #14 (Lower Leg) Wound Laterality: Right, Medial Cleanser Soap and Water Discharge Instruction: May shower and wash wound with dial antibacterial soap and water prior to dressing change. Wound Cleanser Discharge Instruction: Cleanse the wound with wound cleanser prior to applying a clean dressing using gauze sponges, not tissue or cotton balls. Peri-Wound Care Triamcinolone 15 (g) Discharge Instruction: In clinic only. Use triamcinolone 15 (g) in clinic only. Sween Lotion (Moisturizing lotion) Discharge Instruction: Apply moisturizing lotion as directed Topical Primary Dressing KerraCel Ag Gelling Fiber Dressing, 4x5 in (silver alginate) Discharge Instruction: Apply silver alginate to wound bed as instructed Secondary Dressing Woven Gauze Sponge, Non-Sterile 4x4 in Discharge Instruction: Apply over primary dressing as directed. ABD Pad, 8x10 Discharge Instruction: Apply over primary dressing as directed. Secured With Compression Wrap ThreePress (3 layer compression wrap) Discharge Instruction: Apply three layer compression as directed. Compression Stockings Add-Ons Wound #15 (Lower Leg) Wound Laterality: Right, Posterior Cleanser Soap and Water Discharge Instruction: May shower and wash wound with dial antibacterial soap and water prior to dressing change. Wound Cleanser Discharge Instruction: Cleanse the wound with wound cleanser prior to applying a clean dressing using gauze sponges, not tissue or cotton balls. Peri-Wound Care Triamcinolone 15 (g) Discharge Instruction: In clinic only. Use triamcinolone 15 (g) in clinic only. Sween Lotion (Moisturizing lotion) Discharge Instruction: Apply moisturizing lotion as directed Topical Primary  Dressing KerraCel Ag Gelling Fiber Dressing, 4x5 in (silver alginate) Discharge Instruction: Apply silver alginate to wound bed as instructed Secondary Dressing Woven Gauze Sponge, Non-Sterile 4x4 in Discharge Instruction: Apply over primary dressing as directed. ABD Pad, 8x10 Discharge Instruction: Apply over primary dressing as directed. Secured With Compression Wrap ThreePress (3 layer compression wrap) Discharge Instruction: Apply three layer compression as directed. Compression Stockings Add-Ons Wound #16 (Lower Leg) Wound Laterality: Left, Posterior, Proximal Cleanser Soap and Water Discharge Instruction: May shower and wash wound with dial antibacterial soap and water prior to dressing change. Wound Cleanser Discharge Instruction:  Cleanse the wound with wound cleanser prior to applying a clean dressing using gauze sponges, not tissue or cotton balls. Peri-Wound Care Triamcinolone 15 (g) Discharge Instruction: In clinic only. Use triamcinolone 15 (g) in clinic only. Sween Lotion (Moisturizing lotion) Discharge Instruction: Apply moisturizing lotion as directed Topical Primary Dressing KerraCel Ag Gelling Fiber Dressing, 4x5 in (silver alginate) Discharge Instruction: Apply silver alginate to wound bed as instructed Secondary Dressing Woven Gauze Sponge, Non-Sterile 4x4 in Discharge Instruction: Apply over primary dressing as directed. ABD Pad, 8x10 Discharge Instruction: Apply over primary dressing as directed. Secured With Compression Wrap ThreePress (3 layer compression wrap) Discharge Instruction: Apply three layer compression as directed. Compression Stockings Add-Ons Wound #17 (Lower Leg) Wound Laterality: Left, Posterior Cleanser Soap and Water Discharge Instruction: May shower and wash wound with dial antibacterial soap and water prior to dressing change. Wound Cleanser Discharge Instruction: Cleanse the wound with wound cleanser prior to applying a clean  dressing using gauze sponges, not tissue or cotton balls. Peri-Wound Care Triamcinolone 15 (g) Discharge Instruction: In clinic only. Use triamcinolone 15 (g) in clinic only. Sween Lotion (Moisturizing lotion) Discharge Instruction: Apply moisturizing lotion as directed Topical Primary Dressing KerraCel Ag Gelling Fiber Dressing, 4x5 in (silver alginate) Discharge Instruction: Apply silver alginate to wound bed as instructed Secondary Dressing Woven Gauze Sponge, Non-Sterile 4x4 in Discharge Instruction: Apply over primary dressing as directed. ABD Pad, 8x10 Discharge Instruction: Apply over primary dressing as directed. Secured With Compression Wrap ThreePress (3 layer compression wrap) Discharge Instruction: Apply three layer compression as directed. Compression Stockings Add-Ons Wound #7R (Lower Leg) Wound Laterality: Left, Anterior Cleanser Soap and Water Discharge Instruction: May shower and wash wound with dial antibacterial soap and water prior to dressing change. Wound Cleanser Discharge Instruction: Cleanse the wound with wound cleanser prior to applying a clean dressing using gauze sponges, not tissue or cotton balls. Peri-Wound Care Triamcinolone 15 (g) Discharge Instruction: In clinic only. Use triamcinolone 15 (g) in clinic only. Sween Lotion (Moisturizing lotion) Discharge Instruction: Apply moisturizing lotion as directed Topical Primary Dressing KerraCel Ag Gelling Fiber Dressing, 4x5 in (silver alginate) Discharge Instruction: Apply silver alginate to wound bed as instructed Secondary Dressing Woven Gauze Sponge, Non-Sterile 4x4 in Discharge Instruction: Apply over primary dressing as directed. ABD Pad, 8x10 Discharge Instruction: Apply over primary dressing as directed. Secured With Compression Wrap ThreePress (3 layer compression wrap) Discharge Instruction: Apply three layer compression as directed. Compression Stockings Add-Ons Wound #8 (Lower Leg)  Wound Laterality: Right, Lateral Cleanser Soap and Water Discharge Instruction: May shower and wash wound with dial antibacterial soap and water prior to dressing change. Wound Cleanser Discharge Instruction: Cleanse the wound with wound cleanser prior to applying a clean dressing using gauze sponges, not tissue or cotton balls. Peri-Wound Care Triamcinolone 15 (g) Discharge Instruction: In clinic only. Use triamcinolone 15 (g) in clinic only. Sween Lotion (Moisturizing lotion) Discharge Instruction: Apply moisturizing lotion as directed Topical Primary Dressing KerraCel Ag Gelling Fiber Dressing, 4x5 in (silver alginate) Discharge Instruction: Apply silver alginate to wound bed as instructed Secondary Dressing Woven Gauze Sponge, Non-Sterile 4x4 in Discharge Instruction: Apply over primary dressing as directed. ABD Pad, 8x10 Discharge Instruction: Apply over primary dressing as directed. Secured With Compression Wrap ThreePress (3 layer compression wrap) Discharge Instruction: Apply three layer compression as directed. Compression Stockings Add-Ons Wound #9 (Lower Leg) Wound Laterality: Left, Posterior, Distal Cleanser Soap and Water Discharge Instruction: May shower and wash wound with dial antibacterial soap and water prior to dressing change. Wound  Cleanser Discharge Instruction: Cleanse the wound with wound cleanser prior to applying a clean dressing using gauze sponges, not tissue or cotton balls. Peri-Wound Care Triamcinolone 15 (g) Discharge Instruction: In clinic only. Use triamcinolone 15 (g) in clinic only. Sween Lotion (Moisturizing lotion) Discharge Instruction: Apply moisturizing lotion as directed Topical Primary Dressing KerraCel Ag Gelling Fiber Dressing, 4x5 in (silver alginate) Discharge Instruction: Apply silver alginate to wound bed as instructed Secondary Dressing Woven Gauze Sponge, Non-Sterile 4x4 in Discharge Instruction: Apply over primary dressing  as directed. ABD Pad, 8x10 Discharge Instruction: Apply over primary dressing as directed. Secured With Compression Wrap ThreePress (3 layer compression wrap) Discharge Instruction: Apply three layer compression as directed. Compression Stockings Add-Ons Electronic Signature(s) Signed: 11/09/2020 1:46:45 PM By: Kalman Shan DO Signed: 11/13/2020 5:23:14 PM By: Rhae Hammock RN Entered By: Kalman Shan on 11/09/2020 13:41:09 -------------------------------------------------------------------------------- Multi-Disciplinary Care Plan Details Patient Name: Date of Service: [name] ETT, Joaquin. 11/08/2020 11:00 A M Medical Record Number: [id number] Patient Account Number: [id number] Date of Birth/Sex: Treating RN: 10/25/1925 (87 y.o. [name] Primary Care Alese Furniss: [name] Other Clinician: Referring Forrest Demuro: Treating Doyt Castellana/Extender: [name] in Treatment: 21 Multidisciplinary Care Plan reviewed with physician Active Inactive Venous Leg Ulcer Nursing Diagnoses: Knowledge deficit related to disease process and management Potential for venous Insuffiency (use before diagnosis confirmed) Goals: Patient will maintain optimal edema control Date Initiated: 11/01/2020 Target Resolution Date: 11/29/2020 Goal Status: Active Interventions: Assess peripheral edema status every visit. Compression as ordered Provide education on venous insufficiency Treatment Activities: Therapeutic compression applied : 11/01/2020 Notes: Wound/Skin Impairment Nursing Diagnoses: Impaired tissue integrity Knowledge deficit related to ulceration/compromised skin integrity Goals: Patient/caregiver will verbalize understanding of skin care regimen Date Initiated: 06/11/2020 Target Resolution Date: 11/15/2020 Goal Status: Active Interventions: Assess patient/caregiver ability to obtain necessary supplies Assess patient/caregiver  ability to perform ulcer/skin care regimen upon admission and as needed Assess ulceration(s) every visit Provide education on ulcer and skin care Notes: 10/11/20: Wound care regimen ongoing. Electronic Signature(s) Signed: 11/08/2020 5:31:21 PM By: Lorrin Jackson Entered By: Lorrin Jackson on 11/08/2020 11:42:00 -------------------------------------------------------------------------------- Pain Assessment Details Patient Name: Date of Service: [name] ETT, [place] 11/08/2020 11:00 A M Medical Record Number: [id number] Patient Account Number: [id number] Date of Birth/Sex: Treating RN: [birth year] (87 y.o. [name] Primary Care Ceyda Peterka: [name] Other Clinician: Referring Braylynn Ghan: Treating Inanna Telford/Extender: [name] in Treatment: 21 Active Problems Location of Pain Severity and Description of Pain Patient Has Paino No Site Locations Pain Management and Medication Current Pain Management: Electronic Signature(s) Signed: 11/13/2020 2:37:05 PM By: Sandre Kitty Signed: 11/13/2020 5:23:14 PM By: Rhae Hammock RN Entered By: Sandre Kitty on 11/08/2020 11:16:54 -------------------------------------------------------------------------------- Patient/Caregiver Education Details Patient Name: Date of Service: [name] ETT, Indria A. 10/20/2022andnbsp11:00 [place] Record Number: [id number] Patient Account Number: [id number] Date of Birth/Gender: Treating RN: Nov 11, 1925 (87 y.o. [name] Primary Care Physician: [name] Other Clinician: Referring Physician: Treating Physician/Extender: [name] in Treatment: 21 Education Assessment Education Provided To: Patient Education Topics Provided Venous: Methods: Explain/Verbal, Printed Responses: State content correctly Wound/Skin Impairment: Methods: Explain/Verbal, Printed Responses: State content  correctly Electronic Signature(s) Signed: 11/08/2020 5:31:21 PM By: Lorrin Jackson Entered By: Lorrin Jackson on 11/08/2020 11:42:26 -------------------------------------------------------------------------------- Wound Assessment Details Patient Name: Date of Service: [name] ETT, Deyra A. 11/08/2020 11:00 A M Medical Record Number: [id number] Patient Account Number: [id number] Date of Birth/Sex: Treating RN: Feb 24, 1925 (87 y.o. [name]  Primary Care Irvan Tiedt: [name] Other Clinician: Referring Cheyla Duchemin: Treating Billie Trager/Extender: [name] in Treatment: 21 Wound Status Wound Number: 10 Primary Venous Leg Ulcer Etiology: Wound Location: Left, Distal, Medial Lower Leg Wound Open Wounding Event: Gradually Appeared Status: Date Acquired: 10/11/2020 Comorbid Anemia, Arrhythmia, Congestive Heart Failure, Hypertension, Weeks Of Treatment: 4 History: Colitis, Osteoarthritis Clustered Wound: No Photos Photo Uploaded By: Donavan Burnet on 11/08/2020 20:01:28 Wound Measurements Length: (cm) 1.2 Width: (cm) 1.6 Depth: (cm) 0.1 Area: (cm) 1.508 Volume: (cm) 0.151 % Reduction in Area: -220.2% % Reduction in Volume: -221.3% Epithelialization: None Tunneling: No Undermining: No Wound Description Classification: Full Thickness Without Exposed Support Structures Wound Margin: Distinct, outline attached Exudate Amount: Medium Exudate Type: Serosanguineous Exudate Color: red, brown Foul Odor After Cleansing: No Slough/Fibrino No Wound Bed Granulation Amount: Large (67-100%) Exposed Structure Granulation Quality: Red, Pink Fascia Exposed: No Necrotic Amount: None Present (0%) Fat Layer (Subcutaneous Tissue) Exposed: Yes Tendon Exposed: No Muscle Exposed: No Joint Exposed: No Bone Exposed: No Treatment Notes Wound #10 (Lower Leg) Wound Laterality: Left, Medial, Distal Cleanser Soap and Water Discharge Instruction:  May shower and wash wound with dial antibacterial soap and water prior to dressing change. Wound Cleanser Discharge Instruction: Cleanse the wound with wound cleanser prior to applying a clean dressing using gauze sponges, not tissue or cotton balls. Peri-Wound Care Triamcinolone 15 (g) Discharge Instruction: In clinic only. Use triamcinolone 15 (g) in clinic only. Sween Lotion (Moisturizing lotion) Discharge Instruction: Apply moisturizing lotion as directed Topical Primary Dressing KerraCel Ag Gelling Fiber Dressing, 4x5 in (silver alginate) Discharge Instruction: Apply silver alginate to wound bed as instructed Secondary Dressing Woven Gauze Sponge, Non-Sterile 4x4 in Discharge Instruction: Apply over primary dressing as directed. ABD Pad, 8x10 Discharge Instruction: Apply over primary dressing as directed. Secured With Compression Wrap ThreePress (3 layer compression wrap) Discharge Instruction: Apply three layer compression as directed. Compression Stockings Add-Ons Electronic Signature(s) Signed: 11/08/2020 5:31:21 PM By: Lorrin Jackson Entered By: Lorrin Jackson on 11/08/2020 11:33:33 -------------------------------------------------------------------------------- Wound Assessment Details Patient Name: Date of Service: [name] ETT, [place]. 11/08/2020 11:00 A M Medical Record Number: [id number] Patient Account Number: [id number] Date of Birth/Sex: Treating RN: [birth year] (87 y.o. [name] Primary Care Afomia Blackley: [name] Other Clinician: Referring Lavone Weisel: Treating Valiant Dills/Extender: [name] in Treatment: 21 Wound Status Wound Number: 11 Primary Venous Leg Ulcer Etiology: Wound Location: Left, Proximal, Medial Lower Leg Wound Open Wounding Event: Gradually Appeared Status: Date Acquired: 11/01/2020 Comorbid Anemia, Arrhythmia, Congestive Heart Failure, Hypertension, Weeks Of Treatment: 1 History: Colitis,  Osteoarthritis Clustered Wound: Yes Photos Photo Uploaded By: Donavan Burnet on 11/08/2020 20:01:29 Wound Measurements Length: (cm) 1.1 Width: (cm) 0.4 Depth: (cm) 0.1 Area: (cm) 0.346 Volume: (cm) 0.035 % Reduction in Area: 99% % Reduction in Volume: 98.9% Epithelialization: Medium (34-66%) Tunneling: No Undermining: No Wound Description Classification: Full Thickness Without Exposed Support Structures Wound Margin: Distinct, outline attached Exudate Amount: Medium Exudate Type: Serous Exudate Color: amber Foul Odor After Cleansing: No Slough/Fibrino No Wound Bed Granulation Amount: Large (67-100%) Exposed Structure Granulation Quality: Red, Pink Fascia Exposed: No Necrotic Amount: None Present (0%) Fat Layer (Subcutaneous Tissue) Exposed: Yes Tendon Exposed: No Muscle Exposed: No Joint Exposed: No Bone Exposed: No Treatment Notes Wound #11 (Lower Leg) Wound Laterality: Left, Medial, Proximal Cleanser Soap and Water Discharge Instruction: May shower and wash wound with dial antibacterial soap and water prior to dressing change. Wound Cleanser Discharge Instruction: Cleanse the wound with wound  cleanser prior to applying a clean dressing using gauze sponges, not tissue or cotton balls. Peri-Wound Care Triamcinolone 15 (g) Discharge Instruction: In clinic only. Use triamcinolone 15 (g) in clinic only. Sween Lotion (Moisturizing lotion) Discharge Instruction: Apply moisturizing lotion as directed Topical Primary Dressing KerraCel Ag Gelling Fiber Dressing, 4x5 in (silver alginate) Discharge Instruction: Apply silver alginate to wound bed as instructed Secondary Dressing Woven Gauze Sponge, Non-Sterile 4x4 in Discharge Instruction: Apply over primary dressing as directed. ABD Pad, 8x10 Discharge Instruction: Apply over primary dressing as directed. Secured With Compression Wrap ThreePress (3 layer compression wrap) Discharge Instruction: Apply three layer  compression as directed. Compression Stockings Add-Ons Electronic Signature(s) Signed: 11/08/2020 5:31:21 PM By: Lorrin Jackson Entered By: Lorrin Jackson on 11/08/2020 11:34:23 -------------------------------------------------------------------------------- Wound Assessment Details Patient Name: Date of Service: [name] ETT, [place]. 11/08/2020 11:00 A M Medical Record Number: [id number] Patient Account Number: [id number] Date of Birth/Sex: Treating RN: 1925/05/28 (87 y.o. [name] Primary Care Ahlani Wickes: [name] Other Clinician: Referring Quinnton Bury: Treating Vernis Eid/Extender: [name] in Treatment: 21 Wound Status Wound Number: 12 Primary Venous Leg Ulcer Etiology: Wound Location: Left, Lateral Lower Leg Wound Open Wounding Event: Gradually Appeared Wounding Event: Gradually Appeared Status: Date Acquired: 11/01/2020 Comorbid Anemia, Arrhythmia, Congestive Heart Failure, Hypertension, Weeks Of Treatment: 1 History: Colitis, Osteoarthritis Clustered Wound: Yes Photos Photo Uploaded By: Donavan Burnet on 11/08/2020 20:02:04 Wound Measurements Length: (cm) 0.5 Width: (cm) 0.7 Depth: (cm) 0.1 Clustered Quantity: 3 Area: (cm) 0.275 Volume: (cm) 0.027 % Reduction in Area: 95.9% % Reduction in Volume: 96% Epithelialization: Medium (34-66%) Tunneling: No Undermining: No Wound Description Classification: Full Thickness Without Exposed Support Structures Wound Margin: Distinct, outline attached Exudate Amount: Medium Exudate Type: Serous Exudate Color: amber Foul Odor After Cleansing: No Slough/Fibrino No Wound Bed Granulation Amount: Large (67-100%) Exposed Structure Granulation Quality: Red, Pink Fascia Exposed: No Necrotic Amount: None Present (0%) Fat Layer (Subcutaneous Tissue) Exposed: Yes Tendon Exposed: No Muscle Exposed: No Joint Exposed: No Bone Exposed: No Treatment Notes Wound #12 (Lower Leg)  Wound Laterality: Left, Lateral Cleanser Soap and Water Discharge Instruction: May shower and wash wound with dial antibacterial soap and water prior to dressing change. Wound Cleanser Discharge Instruction: Cleanse the wound with wound cleanser prior to applying a clean dressing using gauze sponges, not tissue or cotton balls. Peri-Wound Care Triamcinolone 15 (g) Discharge Instruction: In clinic only. Use triamcinolone 15 (g) in clinic only. Sween Lotion (Moisturizing lotion) Discharge Instruction: Apply moisturizing lotion as directed Topical Primary Dressing KerraCel Ag Gelling Fiber Dressing, 4x5 in (silver alginate) Discharge Instruction: Apply silver alginate to wound bed as instructed Secondary Dressing Woven Gauze Sponge, Non-Sterile 4x4 in Discharge Instruction: Apply over primary dressing as directed. ABD Pad, 8x10 Discharge Instruction: Apply over primary dressing as directed. Secured With Compression Wrap ThreePress (3 layer compression wrap) Discharge Instruction: Apply three layer compression as directed. Compression Stockings Add-Ons Electronic Signature(s) Signed: 11/08/2020 5:31:21 PM By: Lorrin Jackson Entered By: Lorrin Jackson on 11/08/2020 11:32:31 -------------------------------------------------------------------------------- Wound Assessment Details Patient Name: Date of Service: [name] ETT, [place]. 11/08/2020 11:00 A M Medical Record Number: [id number] Patient Account Number: [id number] Date of Birth/Sex: Treating RN: 01-27-1925 (87 y.o. [name] Primary Care Lauralye Kinn: [name] Other Clinician: Referring Darral Rishel: Treating Emir Nack/Extender: [name] in Treatment: 21 Wound Status Wound Number: 13 Primary Venous Leg Ulcer Etiology: Wound Location: Right, Proximal, Lateral Lower Leg Wound Open Wounding Event: Gradually Appeared Status: Date Acquired: 11/01/2020  Comorbid Anemia, Arrhythmia,  Congestive Heart Failure, Hypertension, Weeks Of Treatment: 1 History: Colitis, Osteoarthritis Clustered Wound: Yes Photos Photo Uploaded By: Donavan Burnet on 11/08/2020 20:02:05 Wound Measurements Length: (cm) Width: (cm) Depth: (cm) Clustered Quantity: Area: (cm) Volume: (cm) 0.3 % Reduction in Area: 98.9% 0.3 % Reduction in Volume: 99% 0.1 Epithelialization: Large (67-100%) 3 Tunneling: No 0.071 Undermining: No 0.007 Wound Description Classification: Full Thickness Without Exposed Support Structures Wound Margin: Distinct, outline attached Exudate Amount: Medium Exudate Type: Serous Exudate Color: amber Foul Odor After Cleansing: No Slough/Fibrino No Wound Bed Granulation Amount: Large (67-100%) Exposed Structure Granulation Quality: Red Fascia Exposed: No Necrotic Amount: None Present (0%) Fat Layer (Subcutaneous Tissue) Exposed: Yes Tendon Exposed: No Muscle Exposed: No Joint Exposed: No Bone Exposed: No Treatment Notes Wound #13 (Lower Leg) Wound Laterality: Right, Lateral, Proximal Cleanser Soap and Water Discharge Instruction: May shower and wash wound with dial antibacterial soap and water prior to dressing change. Wound Cleanser Discharge Instruction: Cleanse the wound with wound cleanser prior to applying a clean dressing using gauze sponges, not tissue or cotton balls. Peri-Wound Care Triamcinolone 15 (g) Discharge Instruction: In clinic only. Use triamcinolone 15 (g) in clinic only. Sween Lotion (Moisturizing lotion) Discharge Instruction: Apply moisturizing lotion as directed Topical Primary Dressing KerraCel Ag Gelling Fiber Dressing, 4x5 in (silver alginate) Discharge Instruction: Apply silver alginate to wound bed as instructed Secondary Dressing Woven Gauze Sponge, Non-Sterile 4x4 in Discharge Instruction: Apply over primary dressing as directed. ABD Pad, 8x10 Discharge Instruction: Apply over primary dressing as directed. Secured  With Compression Wrap ThreePress (3 layer compression wrap) Discharge Instruction: Apply three layer compression as directed. Compression Stockings Add-Ons Electronic Signature(s) Signed: 11/08/2020 5:31:21 PM By: Lorrin Jackson Entered By: Lorrin Jackson on 11/08/2020 11:27:46 -------------------------------------------------------------------------------- Wound Assessment Details Patient Name: Date of Service: [name] ETT, [place]. 11/08/2020 11:00 A M Medical Record Number: [id number] Patient Account Number: [id number] Date of Birth/Sex: Treating RN: [birth year] (87 y.o. [name] Primary Care Karver Fadden: [name] Other Clinician: Referring Ridhima Golberg: Treating Niaomi Cartaya/Extender: [name] in Treatment: 21 Wound Status Wound Number: 14 Primary Venous Leg Ulcer Etiology: Wound Location: Right, Medial Lower Leg Wound Open Wounding Event: Gradually Appeared Status: Date Acquired: 11/01/2020 Comorbid Anemia, Arrhythmia, Congestive Heart Failure, Hypertension, Weeks Of Treatment: 1 History: Colitis, Osteoarthritis Clustered Wound: Yes Photos Photo Uploaded By: Donavan Burnet on 11/08/2020 20:04:30 Wound Measurements Length: (cm) 0.1 Width: (cm) 0.1 Depth: (cm) 0.1 Clustered Quantity: 3 Area: (cm) 0.008 Volume: (cm) 0.001 % Reduction in Area: 100% % Reduction in Volume: 100% Epithelialization: Medium (34-66%) Tunneling: No Undermining: No Wound Description Classification: Full Thickness Without Exposed Support Structures Wound Margin: Distinct, outline attached Exudate Amount: Medium Exudate Type: Serous Exudate Color: amber Foul Odor After Cleansing: No Slough/Fibrino No Wound Bed Granulation Amount: Large (67-100%) Exposed Structure Granulation Quality: Red Fascia Exposed: No Necrotic Amount: None Present (0%) Fat Layer (Subcutaneous Tissue) Exposed: Yes Tendon Exposed: No Muscle Exposed: No Joint  Exposed: No Bone Exposed: No Treatment Notes Wound #14 (Lower Leg) Wound Laterality: Right, Medial Cleanser Soap and Water Discharge Instruction: May shower and wash wound with dial antibacterial soap and water prior to dressing change. Wound Cleanser Discharge Instruction: Cleanse the wound with wound cleanser prior to applying a clean dressing using gauze sponges, not tissue or cotton balls. Peri-Wound Care Triamcinolone 15 (g) Discharge Instruction: In clinic only. Use triamcinolone 15 (g) in clinic only. Sween Lotion (Moisturizing lotion) Discharge Instruction: Apply moisturizing lotion as directed Topical Primary  Dressing KerraCel Ag Gelling Fiber Dressing, 4x5 in (silver alginate) Discharge Instruction: Apply silver alginate to wound bed as instructed Secondary Dressing Woven Gauze Sponge, Non-Sterile 4x4 in Discharge Instruction: Apply over primary dressing as directed. ABD Pad, 8x10 Discharge Instruction: Apply over primary dressing as directed. Secured With Compression Wrap ThreePress (3 layer compression wrap) Discharge Instruction: Apply three layer compression as directed. Compression Stockings Add-Ons Electronic Signature(s) Signed: 11/08/2020 5:31:21 PM By: Lorrin Jackson Entered By: Lorrin Jackson on 11/08/2020 11:28:22 -------------------------------------------------------------------------------- Wound Assessment Details Patient Name: Date of Service: [name] ETT, [place]. 11/08/2020 11:00 A M Medical Record Number: [id number] Patient Account Number: [id number] Date of Birth/Sex: Treating RN: February 25, 1925 (87 y.o. [name] Primary Care Aarin Sparkman: [name] Other Clinician: Referring Jeraline Marcinek: Treating Kylar Leonhardt/Extender: [name] in Treatment: 21 Wound Status Wound Number: 15 Primary Venous Leg Ulcer Etiology: Wound Location: Right, Posterior Lower Leg Wound Open Wounding Event: Gradually  Appeared Status: Date Acquired: 11/08/2020 Comorbid Anemia, Arrhythmia, Congestive Heart Failure, Hypertension, Weeks Of Treatment: 0 History: Colitis, Osteoarthritis Clustered Wound: No Photos Photo Uploaded By: Donavan Burnet on 11/08/2020 20:04:30 Wound Measurements Length: (cm) 2 Width: (cm) 2.5 Depth: (cm) 0.1 Area: (cm) 3.927 Volume: (cm) 0.393 % Reduction in Area: % Reduction in Volume: Tunneling: No Undermining: No Wound Description Classification: Full Thickness Without Exposed Support Structures Wound Margin: Distinct, outline attached Exudate Amount: Medium Exudate Type: Serosanguineous Exudate Color: red, brown Foul Odor After Cleansing: No Slough/Fibrino No Wound Bed Granulation Amount: Large (67-100%) Exposed Structure Granulation Quality: Red Fascia Exposed: No Necrotic Amount: None Present (0%) Fat Layer (Subcutaneous Tissue) Exposed: Yes Tendon Exposed: No Muscle Exposed: No Joint Exposed: No Bone Exposed: No Electronic Signature(s) Signed: 11/08/2020 5:31:21 PM By: Lorrin Jackson Entered By: Lorrin Jackson on 11/08/2020 11:29:58 -------------------------------------------------------------------------------- Wound Assessment Details Patient Name: Date of Service: [name] ETT, [place]. 11/08/2020 11:00 A M Medical Record Number: [id number] Patient Account Number: [id number] Date of Birth/Sex: Treating RN: November 04, 1925 (87 y.o. [name] Primary Care Merced Hanners: [name] Other Clinician: Referring Evanie Buckle: Treating Mikal Wisman/Extender: [name] in Treatment: 21 Wound Status Wound Number: 16 Primary Venous Leg Ulcer Etiology: Wound Location: Left, Posterior Lower Leg Wound Open Wounding Event: Gradually Appeared Status: Date Acquired: 11/08/2020 Comorbid Anemia, Arrhythmia, Congestive Heart Failure, Hypertension, Weeks Of Treatment: 0 History: Colitis, Osteoarthritis Clustered Wound:  No Photos Photo Uploaded By: Donavan Burnet on 11/08/2020 20:05:02 Wound Measurements Length: (cm) 1 Width: (cm) 0.8 Depth: (cm) 0.1 Area: (cm) 0.628 Volume: (cm) 0.063 % Reduction in Area: % Reduction in Volume: Epithelialization: None Tunneling: No Undermining: No Wound Description Classification: Full Thickness Without Exposed Support Structures Wound Margin: Distinct, outline attached Exudate Amount: Medium Exudate Type: Serosanguineous Exudate Color: red, brown Foul Odor After Cleansing: No Slough/Fibrino No Wound Bed Granulation Amount: Large (67-100%) Exposed Structure Granulation Quality: Red Fascia Exposed: No Necrotic Amount: None Present (0%) Fat Layer (Subcutaneous Tissue) Exposed: Yes Tendon Exposed: No Muscle Exposed: No Joint Exposed: No Bone Exposed: No Electronic Signature(s) Signed: 11/08/2020 5:31:21 PM By: Lorrin Jackson Entered By: Lorrin Jackson on 11/08/2020 11:36:31 -------------------------------------------------------------------------------- Wound Assessment Details Patient Name: Date of Service: [name] ETT, [place] A. 11/08/2020 11:00 A M Medical Record Number: [id number] Patient Account Number: [id number] Date of Birth/Sex: Treating RN: 01/02/1926 (87 y.o. [name] Primary Care Alizabeth Antonio: [name] Other Clinician: Referring Shania Bjelland: Treating Thelmer Legler/Extender: [name] in Treatment: 21 Wound Status Wound Number: 17 Primary Venous Leg Ulcer Etiology: Wound Location:  Left, Posterior Lower Leg Wound Open Wounding Event: Gradually Appeared Status: Date Acquired: 11/08/2020 Comorbid Anemia, Arrhythmia, Congestive Heart Failure, Hypertension, Weeks Of Treatment: 0 History: Colitis, Osteoarthritis Clustered Wound: No Photos Photo Uploaded By: Donavan Burnet on 11/08/2020 20:05:02 Wound Measurements Length: (cm) 1.5 Width: (cm) 1.2 Depth: (cm) 0.1 Area: (cm)  1.414 Volume: (cm) 0.141 % Reduction in Area: % Reduction in Volume: Epithelialization: None Tunneling: No Undermining: No Wound Description Classification: Full Thickness Without Exposed Support Structures Wound Margin: Distinct, outline attached Exudate Amount: Medium Exudate Type: Serosanguineous Exudate Color: red, brown Foul Odor After Cleansing: No Slough/Fibrino No Wound Bed Granulation Amount: Large (67-100%) Exposed Structure Granulation Quality: Red, Pink Fascia Exposed: No Necrotic Amount: None Present (0%) Fat Layer (Subcutaneous Tissue) Exposed: Yes Tendon Exposed: No Muscle Exposed: No Joint Exposed: No Bone Exposed: No Treatment Notes Wound #17 (Lower Leg) Wound Laterality: Left, Posterior Cleanser Soap and Water Discharge Instruction: May shower and wash wound with dial antibacterial soap and water prior to dressing change. Wound Cleanser Discharge Instruction: Cleanse the wound with wound cleanser prior to applying a clean dressing using gauze sponges, not tissue or cotton balls. Peri-Wound Care Triamcinolone 15 (g) Discharge Instruction: In clinic only. Use triamcinolone 15 (g) in clinic only. Sween Lotion (Moisturizing lotion) Discharge Instruction: Apply moisturizing lotion as directed Topical Primary Dressing KerraCel Ag Gelling Fiber Dressing, 4x5 in (silver alginate) Discharge Instruction: Apply silver alginate to wound bed as instructed Secondary Dressing Woven Gauze Sponge, Non-Sterile 4x4 in Discharge Instruction: Apply over primary dressing as directed. ABD Pad, 8x10 Discharge Instruction: Apply over primary dressing as directed. Secured With Compression Wrap ThreePress (3 layer compression wrap) Discharge Instruction: Apply three layer compression as directed. Compression Stockings Add-Ons Electronic Signature(s) Signed: 11/08/2020 5:31:21 PM By: Lorrin Jackson Entered By: Lorrin Jackson on 11/08/2020  11:38:02 -------------------------------------------------------------------------------- Wound Assessment Details Patient Name: Date of Service: [name] ETT, [place]. 11/08/2020 11:00 A M Medical Record Number: [id number] Patient Account Number: [id number] Date of Birth/Sex: Treating RN: 02/22/1925 (87 y.o. [name] Primary Care Navaeh Kehres: [name] Other Clinician: Referring Jeananne Bedwell: Treating Tip Atienza/Extender: [name] in Treatment: 21 Wound Status Wound Number: 7R Primary Malignant Wound Etiology: Wound Location: Left, Anterior Lower Leg Wound Open Wounding Event: Blister Status: Date Acquired: 04/20/2020 Comorbid Anemia, Arrhythmia, Congestive Heart Failure, Hypertension, Weeks Of Treatment: 21 History: Colitis, Osteoarthritis Clustered Wound: No Photos Photo Uploaded By: Donavan Burnet on 11/08/2020 20:05:57 Wound Measurements Length: (cm) 0.4 Width: (cm) 0.9 Depth: (cm) 0.1 Area: (cm) 0.283 Volume: (cm) 0.028 % Reduction in Area: 90.8% % Reduction in Volume: 90.9% Epithelialization: Medium (34-66%) Tunneling: No Undermining: No Wound Description Classification: Full Thickness Without Exposed Support Structures Wound Margin: Distinct, outline attached Exudate Amount: Medium Exudate Type: Serosanguineous Exudate Color: red, brown Foul Odor After Cleansing: No Slough/Fibrino No Wound Bed Granulation Amount: Large (67-100%) Exposed Structure Granulation Quality: Red, Pink Fascia Exposed: No Necrotic Amount: None Present (0%) Fat Layer (Subcutaneous Tissue) Exposed: Yes Tendon Exposed: No Muscle Exposed: No Joint Exposed: No Bone Exposed: No Treatment Notes Wound #7R (Lower Leg) Wound Laterality: Left, Anterior Cleanser Soap and Water Discharge Instruction: May shower and wash wound with dial antibacterial soap and water prior to dressing change. Wound Cleanser Discharge Instruction: Cleanse the  wound with wound cleanser prior to applying a clean dressing using gauze sponges, not tissue or cotton balls. Peri-Wound Care Triamcinolone 15 (g) Discharge Instruction: In clinic only. Use triamcinolone 15 (g) in clinic only. Sween Lotion (Moisturizing lotion) Discharge Instruction: Apply  moisturizing lotion as directed Topical Primary Dressing KerraCel Ag Gelling Fiber Dressing, 4x5 in (silver alginate) Discharge Instruction: Apply silver alginate to wound bed as instructed Secondary Dressing Woven Gauze Sponge, Non-Sterile 4x4 in Discharge Instruction: Apply over primary dressing as directed. ABD Pad, 8x10 Discharge Instruction: Apply over primary dressing as directed. Secured With Compression Wrap ThreePress (3 layer compression wrap) Discharge Instruction: Apply three layer compression as directed. Compression Stockings Add-Ons Electronic Signature(s) Signed: 11/08/2020 5:31:21 PM By: Lorrin Jackson Entered By: Lorrin Jackson on 11/08/2020 11:35:10 -------------------------------------------------------------------------------- Wound Assessment Details Patient Name: Date of Service: [name] ETT, [place]. 11/08/2020 11:00 A M Medical Record Number: [id number] Patient Account Number: [id number] Date of Birth/Sex: Treating RN: 1925/03/09 (87 y.o. [name] Primary Care Liban Guedes: [name] Other Clinician: Referring Cole Eastridge: Treating Tyris Eliot/Extender: [name] in Treatment: 21 Wound Status Wound Number: 8 Primary Venous Leg Ulcer Etiology: Wound Location: Right, Lateral Lower Leg Wound Open Wounding Event: Gradually Appeared Status: Date Acquired: 09/27/2020 Comorbid Anemia, Arrhythmia, Congestive Heart Failure, Hypertension, Weeks Of Treatment: 6 History: Colitis, Osteoarthritis Clustered Wound: No Photos Photo Uploaded By: Donavan Burnet on 11/08/2020 20:05:58 Wound Measurements Length: (cm) 0.6 Width: (cm)  1.1 Depth: (cm) 0.1 Area: (cm) 0.518 Volume: (cm) 0.052 % Reduction in Area: -10% % Reduction in Volume: -10.6% Epithelialization: Medium (34-66%) Tunneling: No Undermining: No Wound Description Classification: Full Thickness Without Exposed Support Structures Wound Margin: Distinct, outline attached Exudate Amount: Medium Exudate Type: Serous Exudate Color: amber Foul Odor After Cleansing: No Slough/Fibrino No Wound Bed Granulation Amount: Large (67-100%) Exposed Structure Granulation Quality: Red, Pink Fascia Exposed: No Necrotic Amount: None Present (0%) Fat Layer (Subcutaneous Tissue) Exposed: Yes Tendon Exposed: No Muscle Exposed: No Joint Exposed: No Bone Exposed: No Treatment Notes Wound #8 (Lower Leg) Wound Laterality: Right, Lateral Cleanser Soap and Water Discharge Instruction: May shower and wash wound with dial antibacterial soap and water prior to dressing change. Wound Cleanser Discharge Instruction: Cleanse the wound with wound cleanser prior to applying a clean dressing using gauze sponges, not tissue or cotton balls. Peri-Wound Care Triamcinolone 15 (g) Discharge Instruction: In clinic only. Use triamcinolone 15 (g) in clinic only. Sween Lotion (Moisturizing lotion) Discharge Instruction: Apply moisturizing lotion as directed Topical Primary Dressing KerraCel Ag Gelling Fiber Dressing, 4x5 in (silver alginate) Discharge Instruction: Apply silver alginate to wound bed as instructed Secondary Dressing Woven Gauze Sponge, Non-Sterile 4x4 in Discharge Instruction: Apply over primary dressing as directed. ABD Pad, 8x10 Discharge Instruction: Apply over primary dressing as directed. Secured With Compression Wrap ThreePress (3 layer compression wrap) Discharge Instruction: Apply three layer compression as directed. Compression Stockings Add-Ons Electronic Signature(s) Signed: 11/08/2020 5:31:21 PM By: Lorrin Jackson Entered By: Lorrin Jackson on  11/08/2020 11:30:52 -------------------------------------------------------------------------------- Wound Assessment Details Patient Name: Date of Service: [name] ETT, [place]. 11/08/2020 11:00 A M Medical Record Number: [id number] Patient Account Number: [id number] Date of Birth/Sex: Treating RN: 1925/11/18 (87 y.o. [name] Primary Care Avalina Benko: [name] Other Clinician: Referring Brianda Beitler: Treating Anesa Fronek/Extender: [name] in Treatment: 21 Wound Status Wound Number: 9 Primary Venous Leg Ulcer Etiology: Wound Location: Left, Distal, Posterior Lower Leg Wound Open Wounding Event: Gradually Appeared Status: Date Acquired: 10/04/2020 Comorbid Anemia, Arrhythmia, Congestive Heart Failure, Hypertension, Weeks Of Treatment: 5 History: Colitis, Osteoarthritis Clustered Wound: No Photos Photo Uploaded By: Donavan Burnet on 11/08/2020 20:06:16 Wound Measurements Length: (cm) 5.2 Width: (cm) 3.7 Depth: (cm) 0.1 Area: (cm) 15.111 Volume: (cm) 1.511 % Reduction in  Area: -7937.8% % Reduction in Volume: -7852.6% Epithelialization: Medium (34-66%) Tunneling: No Undermining: No Wound Description Classification: Full Thickness Without Exposed Support Structures Wound Margin: Distinct, outline attached Exudate Amount: Medium Exudate Type: Serosanguineous Exudate Color: red, brown Foul Odor After Cleansing: No Slough/Fibrino No Wound Bed Granulation Amount: Large (67-100%) Exposed Structure Granulation Quality: Red, Pink Fascia Exposed: No Necrotic Amount: None Present (0%) Fat Layer (Subcutaneous Tissue) Exposed: Yes Tendon Exposed: No Muscle Exposed: No Joint Exposed: No Bone Exposed: No Treatment Notes Wound #9 (Lower Leg) Wound Laterality: Left, Posterior, Distal Cleanser Soap and Water Discharge Instruction: May shower and wash wound with dial antibacterial soap and water prior to dressing change. Wound  Cleanser Discharge Instruction: Cleanse the wound with wound cleanser prior to applying a clean dressing using gauze sponges, not tissue or cotton balls. Peri-Wound Care Triamcinolone 15 (g) Discharge Instruction: In clinic only. Use triamcinolone 15 (g) in clinic only. Sween Lotion (Moisturizing lotion) Discharge Instruction: Apply moisturizing lotion as directed Topical Primary Dressing KerraCel Ag Gelling Fiber Dressing, 4x5 in (silver alginate) Discharge Instruction: Apply silver alginate to wound bed as instructed Secondary Dressing Woven Gauze Sponge, Non-Sterile 4x4 in Discharge Instruction: Apply over primary dressing as directed. ABD Pad, 8x10 Discharge Instruction: Apply over primary dressing as directed. Secured With Compression Wrap ThreePress (3 layer compression wrap) Discharge Instruction: Apply three layer compression as directed. Compression Stockings Add-Ons Electronic Signature(s) Signed: 11/08/2020 5:31:21 PM By: Lorrin Jackson Entered By: Lorrin Jackson on 11/08/2020 11:31:46 -------------------------------------------------------------------------------- Vitals Details Patient Name: Date of Service: [name] ETT, [place]. 11/08/2020 11:00 A M Medical Record Number: [id number] Patient Account Number: [id number] Date of Birth/Sex: Treating RN: May 02, 1925 (87 y.o. [name] Primary Care Michalla Ringer: [name] Other Clinician: Referring Deshay Kirstein: Treating Ladavia Lindenbaum/Extender: [name] in Treatment: 21 Vital Signs Time Taken: 11:16 Temperature (F): 98.4 Height (in): 65 Pulse (bpm): 84 Weight (lbs): 163 Respiratory Rate (breaths/min): 18 Body Mass Index (BMI): 27.1 Blood Pressure (mmHg): 154/77 Reference Range: 80 - 120 mg / dl Electronic Signature(s) Signed: 11/13/2020 2:37:05 PM By: Sandre Kitty Entered By: Sandre Kitty on 11/08/2020 11:16:48

## 2020-11-13 NOTE — Progress Notes (Signed)
[name] ([id number]) , Visit Report for 11/08/2020 Chief Complaint Document Details Patient Name: Date of Service: [name], [place] 11/08/2020 11:00 A M Medical Record Number: [id number] Patient Account Number: [id number] Date of Birth/Sex: Treating RN: 04/08/1925 (87 y.o. [name], [name] Primary Care Provider: [name] Other Clinician: Referring Provider: Treating Provider/Extender: [name] in Treatment: 21 Information Obtained from: Patient Chief Complaint Bilateral lower extremity wounds that have been biopsied and positive for squamous cell carcinoma Electronic Signature(s) Signed: 11/09/2020 1:46:45 PM By: Kalman Shan DO Entered By: Kalman Shan on 11/09/2020 13:41:19 -------------------------------------------------------------------------------- HPI Details Patient Name: Date of Service: [name], [name] 11/08/2020 11:00 A M Medical Record Number: [id number] Patient Account Number: [id number] Date of Birth/Sex: Treating RN: 1926-01-05 (87 y.o. [name] Primary Care Provider: [name] Other Clinician: Referring Provider: Treating Provider/Extender: [name] in Treatment: 21 History of Present Illness Location: left leg HPI Description: Admission 5/23 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 She states that the wound on the right has been present for a year. The 1 on the left has opened up 1 month ago. She is followed with oncology for this issue as she had biopsies that showed squamous cell carcinoma. She is also seeing radiation oncology for treatment options. She presents today because she would like for her wounds  to be healed by [place]. She currently denies signs of infection. 6/1; patient presents for 1 week follow-up. She states she has tolerated the leg wraps well. She states these do not bother her and is happy to continue with them. She is scheduled to see her oncologist today to go over treatment options for the bilateral lower extremity squamous cell carcinoma. Radiation is currently not a recommended option. Patient states she overall feels well. 6/22; patient presents for 3-week follow-up. She has tolerated the wraps well until her last wrap where she states they were uncomfortable. She attributes this to the home health nurse. She denies signs of infection. She has started her first treatment of antibody infusions for her Bilateral lower extremity squamous cell carcinoma. She has no complaints today. 7/21; patient presents for 1 month follow-up. Unfortunately she has not had good experience with her wrap changes with home health. She would like to do her own dressing changes. She continues to do her antibody infusions. She denies signs of infection. 7/28; patient presents for 1 week follow-up. At last clinic visit she was switched to daily dressing changes due to issues with the wrap and home health placing them. Unfortunately she has developed weeping to her legs bilaterally. She would like to be placed in wraps today. She would also like to follow with [place] weekly for wrap changes instead of having home health change them. She denies signs of infection. 8/4; patient presents for 1 week follow-up. She has tolerated the Kerlix/Coban wraps well. She no longer has weeping to her legs. She took the wrap off 1 day before coming in to be able to take a shower. She has no issues or complaints today. She denies signs of infection. 8/18; patient presents for follow-up. Patient has tolerated the wraps well. She brought her Velcro compression wraps today. She has no issues or complaints today. She had her  chemotherapy infusion yesterday without issues. She denies signs of infection. 8/25; patient presents for follow-up. She used her juxta lite compressions for the past week. It is unclear if she is able to put these on correctly since she states she has a hard time getting them to look right. She reports 2 open wounds. She currently denies signs of infection. 9/1; patient presents for 1 week follow-up. She has 1 open wound. She tolerated the compression wraps well. She currently denies signs of infection. 9/8; patient presents for 1 week follow-up. She has 2 open wounds 1 on each leg. She has tolerated the compression wrap well. She currently denies signs of infection. 9/15; patient presents for 1 week follow-up. She now has 3 wounds. 2 on the left and 1 on the right. She continues to tolerate the compression wrap well. She currently denies signs of infection. She has obtained furosemide by her primary care physician and would like to discuss when to take this. 9/22; patient presents for 1 week follow-up. She has scattered wounds on her lower extremities bilaterally. She did take furosemide twice in the past week. She does not recall having to urinate more frequently. She tolerated the 3 layer compression well. She denies signs of infection. 10/13; patient has not been here recently because of [place]. Apparently the facility was only putting gauze on her legs. This is a patient I do not normally see. 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 Ronnald Ramp at [facility] dermatology. She is quite convincing that she did not have radiation to her lower legs. It is likely she also has significant chronic venous insufficiency stasis dermatitis. We have been using silver alginate under kerlix Coban. She has obvious open areas medially on the right and areas on the left. She also has areas of extensive dry flaking adherent areas on the right  and to a lesser extent on the left anterior. Nodular areas on the left lateral lower leg left posterior calf and right mid calf medially. 10/21; patient presents for follow-up. She has no issues or complaints today. She has tolerated the 3 layer compression well bilaterally. She denies signs of infection. Electronic Signature(s) Signed: 11/09/2020 1:46:45 PM By: Kalman Shan DO Entered By: Kalman Shan on 11/09/2020 13:41:54 -------------------------------------------------------------------------------- Physical Exam Details Patient Name: Date of Service: [name], [place]. 11/08/2020 11:00 A M Medical Record Number: [id number] Patient Account Number: [id number] Date of Birth/Sex: Treating RN: [birth year] (87 y.o. [name], [name] Primary Care Provider: [name] Other Clinician: Referring Provider: Treating Provider/Extender: [name] in Treatment: 21 Constitutional respirations regular, non-labored and within target range for patient.. Cardiovascular 2+ dorsalis pedis/posterior tibialis pulses. Psychiatric pleasant and cooperative. Notes Scattered open wounds to her legs bilaterally limited to skin breakdown. She does have hyperkeratotic areas extensively anteriorly right greater than left and flat nodules to the left lateral, left posterior and right mid calf Electronic Signature(s) Signed: 11/09/2020 1:46:45 PM By: Kalman Shan DO Entered By: Kalman Shan on 11/09/2020 13:43:05 -------------------------------------------------------------------------------- Physician Orders Details Patient Name: Date of Service: [name], [place]. 11/08/2020 11:00 A M Medical Record Number: [id number] Patient Account Number: [id number] Date of Birth/Sex: Treating RN: April 19, 1925 (87 y.o. [name] Primary Care Provider: [name] Other Clinician: Referring Provider: Treating Provider/Extender: [name] in Treatment: 41 Verbal / Phone Orders: No Diagnosis Coding ICD-10 Coding Code Description C44.92 Squamous  cell carcinoma of skin, unspecified L97.819 Non-pressure chronic ulcer of other part of right lower leg with unspecified severity L97.829 Non-pressure chronic ulcer of other part of left lower leg with unspecified severity I87.2 Venous insufficiency (chronic) (peripheral) Follow-up Appointments ppointment in 1 week. - with Dr. Heber Haverhill Return A Bathing/ Shower/ Hygiene May shower with protection but do not get wound dressing(s) wet. - Use cast protector Edema Control - Lymphedema / SCD / Other Elevate legs to the level of the heart or above for 30 minutes daily and/or when sitting, a frequency of: - elevate the legs throughout the day heart level if possible. Avoid standing for long periods of time. Exercise regularly Additional Orders / Instructions Follow Nutritious Diet Wound Treatment Wound #10 - Lower Leg Wound Laterality: Left, Medial, Distal Cleanser: Soap and Water 1 x Per Week/15 Days Discharge Instructions: May shower and wash wound with dial antibacterial soap and water prior to dressing change. Cleanser: Wound Cleanser 1 x Per Week/15 Days Discharge Instructions: Cleanse the wound with wound cleanser prior to applying a clean dressing using gauze sponges, not tissue or cotton balls. Peri-Wound Care: Triamcinolone 15 (g) 1 x Per Week/15 Days Discharge Instructions: In clinic only. Use triamcinolone 15 (g) in clinic only. Peri-Wound Care: Sween Lotion (Moisturizing lotion) 1 x Per Week/15 Days Discharge Instructions: Apply moisturizing lotion as directed Prim Dressing: KerraCel Ag Gelling Fiber Dressing, 4x5 in (silver alginate) 1 x Per Week/15 Days ary Discharge Instructions: Apply silver alginate to wound bed as instructed Secondary Dressing: Woven Gauze Sponge, Non-Sterile 4x4 in 1 x Per Week/15 Days Discharge Instructions:  Apply over primary dressing as directed. Secondary Dressing: ABD Pad, 8x10 1 x Per Week/15 Days Discharge Instructions: Apply over primary dressing as directed. Compression Wrap: ThreePress (3 layer compression wrap) 1 x Per Week/15 Days Discharge Instructions: Apply three layer compression as directed. Wound #11 - Lower Leg Wound Laterality: Left, Medial, Proximal Cleanser: Soap and Water 1 x Per Week/15 Days Discharge Instructions: May shower and wash wound with dial antibacterial soap and water prior to dressing change. Cleanser: Wound Cleanser 1 x Per Week/15 Days Discharge Instructions: Cleanse the wound with wound cleanser prior to applying a clean dressing using gauze sponges, not tissue or cotton balls. Peri-Wound Care: Triamcinolone 15 (g) 1 x Per Week/15 Days Discharge Instructions: In clinic only. Use triamcinolone 15 (g) in clinic only. Peri-Wound Care: Sween Lotion (Moisturizing lotion) 1 x Per Week/15 Days Discharge Instructions: Apply moisturizing lotion as directed Prim Dressing: KerraCel Ag Gelling Fiber Dressing, 4x5 in (silver alginate) 1 x Per Week/15 Days ary Discharge Instructions: Apply silver alginate to wound bed as instructed Secondary Dressing: Woven Gauze Sponge, Non-Sterile 4x4 in 1 x Per Week/15 Days Discharge Instructions: Apply over primary dressing as directed. Secondary Dressing: ABD Pad, 8x10 1 x Per Week/15 Days Discharge Instructions: Apply over primary dressing as directed. Compression Wrap: ThreePress (3 layer compression wrap) 1 x Per Week/15 Days Discharge Instructions: Apply three layer compression as directed. Wound #12 - Lower Leg Wound Laterality: Left, Lateral Cleanser: Soap and Water 1 x Per Week/15 Days Discharge Instructions: May shower and wash wound with dial antibacterial soap and water prior to dressing change. Cleanser: Wound Cleanser 1 x Per Week/15 Days Discharge Instructions: Cleanse the wound with wound cleanser prior to applying a  clean dressing using gauze sponges, not tissue or cotton balls. Peri-Wound Care: Triamcinolone 15 (g) 1 x Per Week/15 Days Discharge Instructions: In clinic only. Use triamcinolone 15 (g) in clinic only. Peri-Wound Care:  Sween Lotion (Moisturizing lotion) 1 x Per Week/15 Days Discharge Instructions: Apply moisturizing lotion as directed Prim Dressing: KerraCel Ag Gelling Fiber Dressing, 4x5 in (silver alginate) 1 x Per Week/15 Days ary Discharge Instructions: Apply silver alginate to wound bed as instructed Secondary Dressing: Woven Gauze Sponge, Non-Sterile 4x4 in 1 x Per Week/15 Days Discharge Instructions: Apply over primary dressing as directed. Secondary Dressing: ABD Pad, 8x10 1 x Per Week/15 Days Discharge Instructions: Apply over primary dressing as directed. Compression Wrap: ThreePress (3 layer compression wrap) 1 x Per Week/15 Days Discharge Instructions: Apply three layer compression as directed. Wound #13 - Lower Leg Wound Laterality: Right, Lateral, Proximal Cleanser: Soap and Water 1 x Per Week/15 Days Discharge Instructions: May shower and wash wound with dial antibacterial soap and water prior to dressing change. Cleanser: Wound Cleanser 1 x Per Week/15 Days Discharge Instructions: Cleanse the wound with wound cleanser prior to applying a clean dressing using gauze sponges, not tissue or cotton balls. Peri-Wound Care: Triamcinolone 15 (g) 1 x Per Week/15 Days Discharge Instructions: In clinic only. Use triamcinolone 15 (g) in clinic only. Peri-Wound Care: Sween Lotion (Moisturizing lotion) 1 x Per Week/15 Days Discharge Instructions: Apply moisturizing lotion as directed Prim Dressing: KerraCel Ag Gelling Fiber Dressing, 4x5 in (silver alginate) 1 x Per Week/15 Days ary Discharge Instructions: Apply silver alginate to wound bed as instructed Secondary Dressing: Woven Gauze Sponge, Non-Sterile 4x4 in 1 x Per Week/15 Days Discharge Instructions: Apply over primary dressing  as directed. Secondary Dressing: ABD Pad, 8x10 1 x Per Week/15 Days Discharge Instructions: Apply over primary dressing as directed. Compression Wrap: ThreePress (3 layer compression wrap) 1 x Per Week/15 Days Discharge Instructions: Apply three layer compression as directed. Wound #14 - Lower Leg Wound Laterality: Right, Medial Cleanser: Soap and Water 1 x Per Week/15 Days Discharge Instructions: May shower and wash wound with dial antibacterial soap and water prior to dressing change. Cleanser: Wound Cleanser 1 x Per Week/15 Days Discharge Instructions: Cleanse the wound with wound cleanser prior to applying a clean dressing using gauze sponges, not tissue or cotton balls. Peri-Wound Care: Triamcinolone 15 (g) 1 x Per Week/15 Days Discharge Instructions: In clinic only. Use triamcinolone 15 (g) in clinic only. Peri-Wound Care: Sween Lotion (Moisturizing lotion) 1 x Per Week/15 Days Discharge Instructions: Apply moisturizing lotion as directed Prim Dressing: KerraCel Ag Gelling Fiber Dressing, 4x5 in (silver alginate) 1 x Per Week/15 Days ary Discharge Instructions: Apply silver alginate to wound bed as instructed Secondary Dressing: Woven Gauze Sponge, Non-Sterile 4x4 in 1 x Per Week/15 Days Discharge Instructions: Apply over primary dressing as directed. Secondary Dressing: ABD Pad, 8x10 1 x Per Week/15 Days Discharge Instructions: Apply over primary dressing as directed. Compression Wrap: ThreePress (3 layer compression wrap) 1 x Per Week/15 Days Discharge Instructions: Apply three layer compression as directed. Wound #15 - Lower Leg Wound Laterality: Right, Posterior Cleanser: Soap and Water 1 x Per Week/15 Days Discharge Instructions: May shower and wash wound with dial antibacterial soap and water prior to dressing change. Cleanser: Wound Cleanser 1 x Per Week/15 Days Discharge Instructions: Cleanse the wound with wound cleanser prior to applying a clean dressing using gauze  sponges, not tissue or cotton balls. Peri-Wound Care: Triamcinolone 15 (g) 1 x Per Week/15 Days Discharge Instructions: In clinic only. Use triamcinolone 15 (g) in clinic only. Peri-Wound Care: Sween Lotion (Moisturizing lotion) 1 x Per Week/15 Days Discharge Instructions: Apply moisturizing lotion as directed Prim Dressing: KerraCel Ag Gelling Fiber Dressing, 4x5  in (silver alginate) 1 x Per Week/15 Days ary Discharge Instructions: Apply silver alginate to wound bed as instructed Secondary Dressing: Woven Gauze Sponge, Non-Sterile 4x4 in 1 x Per Week/15 Days Discharge Instructions: Apply over primary dressing as directed. Secondary Dressing: ABD Pad, 8x10 1 x Per Week/15 Days Discharge Instructions: Apply over primary dressing as directed. Compression Wrap: ThreePress (3 layer compression wrap) 1 x Per Week/15 Days Discharge Instructions: Apply three layer compression as directed. Wound #16 - Lower Leg Wound Laterality: Left, Posterior, Proximal Cleanser: Soap and Water 1 x Per Week/15 Days Discharge Instructions: May shower and wash wound with dial antibacterial soap and water prior to dressing change. Cleanser: Wound Cleanser 1 x Per Week/15 Days Discharge Instructions: Cleanse the wound with wound cleanser prior to applying a clean dressing using gauze sponges, not tissue or cotton balls. Peri-Wound Care: Triamcinolone 15 (g) 1 x Per Week/15 Days Discharge Instructions: In clinic only. Use triamcinolone 15 (g) in clinic only. Peri-Wound Care: Sween Lotion (Moisturizing lotion) 1 x Per Week/15 Days Discharge Instructions: Apply moisturizing lotion as directed Prim Dressing: KerraCel Ag Gelling Fiber Dressing, 4x5 in (silver alginate) 1 x Per Week/15 Days ary Discharge Instructions: Apply silver alginate to wound bed as instructed Secondary Dressing: Woven Gauze Sponge, Non-Sterile 4x4 in 1 x Per Week/15 Days Discharge Instructions: Apply over primary dressing as directed. Secondary  Dressing: ABD Pad, 8x10 1 x Per Week/15 Days Discharge Instructions: Apply over primary dressing as directed. Compression Wrap: ThreePress (3 layer compression wrap) 1 x Per Week/15 Days Discharge Instructions: Apply three layer compression as directed. Wound #17 - Lower Leg Wound Laterality: Left, Posterior Cleanser: Soap and Water 1 x Per Week/15 Days Discharge Instructions: May shower and wash wound with dial antibacterial soap and water prior to dressing change. Cleanser: Wound Cleanser 1 x Per Week/15 Days Discharge Instructions: Cleanse the wound with wound cleanser prior to applying a clean dressing using gauze sponges, not tissue or cotton balls. Peri-Wound Care: Triamcinolone 15 (g) 1 x Per Week/15 Days Discharge Instructions: In clinic only. Use triamcinolone 15 (g) in clinic only. Peri-Wound Care: Sween Lotion (Moisturizing lotion) 1 x Per Week/15 Days Discharge Instructions: Apply moisturizing lotion as directed Prim Dressing: KerraCel Ag Gelling Fiber Dressing, 4x5 in (silver alginate) 1 x Per Week/15 Days ary Discharge Instructions: Apply silver alginate to wound bed as instructed Secondary Dressing: Woven Gauze Sponge, Non-Sterile 4x4 in 1 x Per Week/15 Days Discharge Instructions: Apply over primary dressing as directed. Secondary Dressing: ABD Pad, 8x10 1 x Per Week/15 Days Discharge Instructions: Apply over primary dressing as directed. Compression Wrap: ThreePress (3 layer compression wrap) 1 x Per Week/15 Days Discharge Instructions: Apply three layer compression as directed. Wound #7R - Lower Leg Wound Laterality: Left, Anterior Cleanser: Soap and Water 1 x Per Week/15 Days Discharge Instructions: May shower and wash wound with dial antibacterial soap and water prior to dressing change. Cleanser: Wound Cleanser 1 x Per Week/15 Days Discharge Instructions: Cleanse the wound with wound cleanser prior to applying a clean dressing using gauze sponges, not tissue or cotton  balls. Peri-Wound Care: Triamcinolone 15 (g) 1 x Per Week/15 Days Discharge Instructions: In clinic only. Use triamcinolone 15 (g) in clinic only. Peri-Wound Care: Sween Lotion (Moisturizing lotion) 1 x Per Week/15 Days Discharge Instructions: Apply moisturizing lotion as directed Prim Dressing: KerraCel Ag Gelling Fiber Dressing, 4x5 in (silver alginate) 1 x Per Week/15 Days ary Discharge Instructions: Apply silver alginate to wound bed as instructed Secondary Dressing: Woven Gauze Sponge,  Non-Sterile 4x4 in 1 x Per Week/15 Days Discharge Instructions: Apply over primary dressing as directed. Secondary Dressing: ABD Pad, 8x10 1 x Per Week/15 Days Discharge Instructions: Apply over primary dressing as directed. Compression Wrap: ThreePress (3 layer compression wrap) 1 x Per Week/15 Days Discharge Instructions: Apply three layer compression as directed. Wound #8 - Lower Leg Wound Laterality: Right, Lateral Cleanser: Soap and Water 1 x Per Week/15 Days Discharge Instructions: May shower and wash wound with dial antibacterial soap and water prior to dressing change. Cleanser: Wound Cleanser 1 x Per Week/15 Days Discharge Instructions: Cleanse the wound with wound cleanser prior to applying a clean dressing using gauze sponges, not tissue or cotton balls. Peri-Wound Care: Triamcinolone 15 (g) 1 x Per Week/15 Days Discharge Instructions: In clinic only. Use triamcinolone 15 (g) in clinic only. Peri-Wound Care: Sween Lotion (Moisturizing lotion) 1 x Per Week/15 Days Discharge Instructions: Apply moisturizing lotion as directed Prim Dressing: KerraCel Ag Gelling Fiber Dressing, 4x5 in (silver alginate) 1 x Per Week/15 Days ary Discharge Instructions: Apply silver alginate to wound bed as instructed Secondary Dressing: Woven Gauze Sponge, Non-Sterile 4x4 in 1 x Per Week/15 Days Discharge Instructions: Apply over primary dressing as directed. Secondary Dressing: ABD Pad, 8x10 1 x Per Week/15  Days Discharge Instructions: Apply over primary dressing as directed. Compression Wrap: ThreePress (3 layer compression wrap) 1 x Per Week/15 Days Discharge Instructions: Apply three layer compression as directed. Wound #9 - Lower Leg Wound Laterality: Left, Posterior, Distal Cleanser: Soap and Water 1 x Per Week/15 Days Discharge Instructions: May shower and wash wound with dial antibacterial soap and water prior to dressing change. Cleanser: Wound Cleanser 1 x Per Week/15 Days Discharge Instructions: Cleanse the wound with wound cleanser prior to applying a clean dressing using gauze sponges, not tissue or cotton balls. Peri-Wound Care: Triamcinolone 15 (g) 1 x Per Week/15 Days Discharge Instructions: In clinic only. Use triamcinolone 15 (g) in clinic only. Peri-Wound Care: Sween Lotion (Moisturizing lotion) 1 x Per Week/15 Days Discharge Instructions: Apply moisturizing lotion as directed Prim Dressing: KerraCel Ag Gelling Fiber Dressing, 4x5 in (silver alginate) 1 x Per Week/15 Days ary Discharge Instructions: Apply silver alginate to wound bed as instructed Secondary Dressing: Woven Gauze Sponge, Non-Sterile 4x4 in 1 x Per Week/15 Days Discharge Instructions: Apply over primary dressing as directed. Secondary Dressing: ABD Pad, 8x10 1 x Per Week/15 Days Discharge Instructions: Apply over primary dressing as directed. Compression Wrap: ThreePress (3 layer compression wrap) 1 x Per Week/15 Days Discharge Instructions: Apply three layer compression as directed. Services and Therapies Venous Studies -Bilateral - Venous Reflux Studies, Bilateral Legs ICD: L97.829. L97.819, I87.2 CPT: Electronic Signature(s) Signed: 11/09/2020 1:46:45 PM By: Kalman Shan DO Previous Signature: 11/08/2020 5:31:21 PM Version By: Lorrin Jackson Entered By: Kalman Shan on 11/09/2020 13:43:35 Prescription 11/08/2020 [place] [facility] -------------------------------------------------------------------------------- Kalman Shan DO Patient Name: Provider: May 05, 1925 [id number] Date of Birth: NPI#Corliss Marcus Sex: DEA #: [phone] Phone #: License #: [place] Patient Address: [address], [place] [address] [place], [name] [address] [phone] Allergies Zosyn; atorvastatin; atenolol; clarithromycin; codeine; Levaquin; Macrodantin; oxycodone Provider's Orders Venous Studies -Bilateral - Venous Reflux Studies, Bilateral Legs ICD: L97.829. B28.413, I87.2 CPT: Hand Signature: Date(s): Electronic Signature(s) Signed: 11/09/2020 1:46:45 PM By: Kalman Shan DO Previous Signature: 11/08/2020 5:31:21 PM Version By: Lorrin Jackson Entered By: Kalman Shan on 11/09/2020 13:43:39 -------------------------------------------------------------------------------- Problem List Details  Patient Name: Date of Service: [name], [place]. 11/08/2020 11:00 A M Medical Record Number: [id number] Patient Account Number: [id number] Date of Birth/Sex: Treating RN: 17-Apr-1925 (87 y.o. [name] Primary Care Provider: [name] Other Clinician: Referring Provider: Treating Provider/Extender: [name] in Treatment: 21 Active Problems ICD-10 Encounter Code Description Active Date MDM Diagnosis C44.92 Squamous cell carcinoma of skin, unspecified 06/11/2020 No Yes L97.819 Non-pressure chronic ulcer of other part of right lower leg with unspecified 06/11/2020 No Yes severity L97.829 Non-pressure chronic ulcer of other part of left lower leg with unspecified 06/11/2020 No Yes severity I87.2 Venous insufficiency (chronic) (peripheral) 06/11/2020 No Yes Inactive Problems Resolved Problems Electronic Signature(s) Signed: 11/09/2020 1:46:45 PM By: Kalman Shan  DO Previous Signature: 11/08/2020 5:31:21 PM Version By: Lorrin Jackson Entered By: Kalman Shan on 11/09/2020 13:40:57 -------------------------------------------------------------------------------- Progress Note Details Patient Name: Date of Service: [name], Bryce A. 11/08/2020 11:00 A M Medical Record Number: [id number] Patient Account Number: [id number] Date of Birth/Sex: Treating RN: February 01, 1925 (87 y.o. [name] Primary Care Provider: [name] Other Clinician: Referring Provider: Treating Provider/Extender: [name] in Treatment: 21 Subjective Chief Complaint Information obtained from Patient Bilateral lower extremity wounds that have been biopsied and positive for squamous cell carcinoma History of Present Illness (HPI) The following HPI elements were documented for the patient's wound: Location: left leg Admission 5/23 Ms. 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 She states that the wound on the right has been present for a year. The 1 on the left has opened up 1 month ago. She is followed with oncology for this issue as she had biopsies that showed squamous cell carcinoma. She is also seeing radiation oncology for treatment options. She presents today because she would like for her wounds to be healed by [place]. She currently denies signs of infection. 6/1; patient presents for 1 week follow-up. She states she has tolerated the leg wraps well. She states these do not bother her and is happy to continue with them. She is scheduled to see her oncologist today to go over treatment options for the bilateral lower extremity squamous cell carcinoma. Radiation is currently not a recommended option.  Patient states she overall feels well. 6/22; patient presents for 3-week follow-up. She has tolerated the wraps well until her last wrap where she states they were uncomfortable. She attributes this to the home health nurse. She denies signs of infection. She has started her first treatment of antibody infusions for her Bilateral lower extremity squamous cell carcinoma. She has no complaints today. 7/21; patient presents for 1 month follow-up. Unfortunately she has not had good experience with her wrap changes with home health. She would like to do her own dressing changes. She continues to do her antibody infusions. She denies signs of infection. 7/28; patient presents for 1 week follow-up. At last clinic visit she was switched to daily dressing changes due to issues with the wrap and home health placing them. Unfortunately she has developed weeping to her legs bilaterally. She would like to be placed in wraps today. She would also like to follow with [place] weekly for wrap changes instead of having home health change them. She denies signs of infection. 8/4; patient presents for 1 week  follow-up. She has tolerated the Kerlix/Coban wraps well. She no longer has weeping to her legs. She took the wrap off 1 day before coming in to be able to take a shower. She has no issues or complaints today. She denies signs of infection. 8/18; patient presents for follow-up. Patient has tolerated the wraps well. She brought her Velcro compression wraps today. She has no issues or complaints today. She had her chemotherapy infusion yesterday without issues. She denies signs of infection. 8/25; patient presents for follow-up. She used her juxta lite compressions for the past week. It is unclear if she is able to put these on correctly since she states she has a hard time getting them to look right. She reports 2 open wounds. She currently denies signs of infection. 9/1; patient presents for 1 week follow-up. She has 1  open wound. She tolerated the compression wraps well. She currently denies signs of infection. 9/8; patient presents for 1 week follow-up. She has 2 open wounds 1 on each leg. She has tolerated the compression wrap well. She currently denies signs of infection. 9/15; patient presents for 1 week follow-up. She now has 3 wounds. 2 on the left and 1 on the right. She continues to tolerate the compression wrap well. She currently denies signs of infection. She has obtained furosemide by her primary care physician and would like to discuss when to take this. 9/22; patient presents for 1 week follow-up. She has scattered wounds on her lower extremities bilaterally. She did take furosemide twice in the past week. She does not recall having to urinate more frequently. She tolerated the 3 layer compression well. She denies signs of infection. 10/13; patient has not been here recently because of [place]. Apparently the facility was only putting gauze on her legs. This is a patient I do not normally see. 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 Ronnald Ramp at [facility] dermatology. She is quite convincing that she did not have radiation to her lower legs. It is likely she also has significant chronic venous insufficiency stasis dermatitis. We have been using silver alginate under kerlix Coban. She has obvious open areas medially on the right and areas on the left. She also has areas of extensive dry flaking adherent areas on the right and to a lesser extent on the left anterior. Nodular areas on the left lateral lower leg left posterior calf and right mid calf medially. 10/21; patient presents for follow-up. She has no issues or complaints today. She has tolerated the 3 layer compression well bilaterally. She denies signs of infection. Patient History Information obtained from Patient. Family History Unknown History. Social  History Never smoker, Marital Status - Single, Alcohol Use - Never, Drug Use - No History, Caffeine Use - Never. Medical History Eyes Denies history of Cataracts, Optic Neuritis Ear/Nose/Mouth/Throat Denies history of Chronic sinus problems/congestion, Middle ear problems Hematologic/Lymphatic Patient has history of Anemia Denies history of Hemophilia, Human Immunodeficiency Virus, Lymphedema, Sickle Cell Disease Respiratory Denies history of Aspiration, Asthma, Chronic Obstructive Pulmonary Disease (COPD), Pneumothorax, Sleep Apnea, Tuberculosis Cardiovascular Patient has history of Arrhythmia - Atrial Flutter, A fibb, Congestive Heart Failure, Hypertension Denies history of Angina, Coronary Artery Disease, Deep Vein Thrombosis, Hypotension, Myocardial Infarction, Peripheral Arterial Disease, Peripheral Venous Disease, Phlebitis, Vasculitis Gastrointestinal Patient has history of Colitis Denies history of Cirrhosis , Crohnoos, Hepatitis A, Hepatitis B, Hepatitis C Endocrine Denies history of Type I Diabetes, Type  II Diabetes Genitourinary Denies history of End Stage Renal Disease Immunological Denies history of Lupus Erythematosus, Raynaudoos, Scleroderma Integumentary (Skin) Denies history of History of Burn Musculoskeletal Patient has history of Osteoarthritis Denies history of Gout, Rheumatoid Arthritis, Osteomyelitis Neurologic Denies history of Dementia, Neuropathy, Quadriplegia, Paraplegia, Seizure Disorder Oncologic Denies history of Received Chemotherapy, Received Radiation Hospitalization/Surgery History - removal of rod left leg. 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 Vitals Time Taken: 11:16 AM, Height: 65 in, Weight: 163 lbs, BMI: 27.1,  Temperature: 98.4 F, Pulse: 84 bpm, Respiratory Rate: 18 breaths/min, Blood Pressure: 154/77 mmHg. Cardiovascular 2+ dorsalis pedis/posterior tibialis pulses. Psychiatric pleasant and cooperative. General Notes: Scattered open wounds to her legs bilaterally limited to skin breakdown. She does have hyperkeratotic areas extensively anteriorly right greater than left and flat nodules to the left lateral, left posterior and right mid calf Integumentary (Hair, Skin) Wound #10 status is Open. Original cause of wound was Gradually Appeared. The date acquired was: 10/11/2020. The wound has been in treatment 4 weeks. The wound is located on the Left,Distal,Medial Lower Leg. The wound measures 1.2cm length x 1.6cm width x 0.1cm depth; 1.508cm^2 area and 0.151cm^3 volume. There is Fat Layer (Subcutaneous Tissue) exposed. There is no tunneling or undermining noted. There is a medium amount of serosanguineous drainage noted. The wound margin is distinct with the outline attached to the wound base. There is large (67-100%) red, pink granulation within the wound bed. There is no necrotic tissue within the wound bed. Wound #11 status is Open. Original cause of wound was Gradually Appeared. The date acquired was: 11/01/2020. The wound has been in treatment 1 weeks. The wound is located on the Left,Proximal,Medial Lower Leg. The wound measures 1.1cm length x 0.4cm width x 0.1cm depth; 0.346cm^2 area and 0.035cm^3 volume. There is Fat Layer (Subcutaneous Tissue) exposed. There is no tunneling or undermining noted. There is a medium amount of serous drainage noted. The wound margin is distinct with the outline attached to the wound base. There is large (67-100%) red, pink granulation within the wound bed. There is no necrotic tissue within the wound bed. Wound #12 status is Open. Original cause of wound was Gradually Appeared. The date acquired was: 11/01/2020. The wound has been in treatment 1 weeks. The wound is  located on the Left,Lateral Lower Leg. The wound measures 0.5cm length x 0.7cm width x 0.1cm depth; 0.275cm^2 area and 0.027cm^3 volume. There is Fat Layer (Subcutaneous Tissue) exposed. There is no tunneling or undermining noted. There is a medium amount of serous drainage noted. The wound margin is distinct with the outline attached to the wound base. There is large (67-100%) red, pink granulation within the wound bed. There is no necrotic tissue within the wound bed. Wound #13 status is Open. Original cause of wound was Gradually Appeared. The date acquired was: 11/01/2020. The wound has been in treatment 1 weeks. The wound is located on the Right,Proximal,Lateral Lower Leg. The wound measures 0.3cm length x 0.3cm width x 0.1cm depth; 0.071cm^2 area and 0.007cm^3 volume. There is Fat Layer (Subcutaneous Tissue) exposed. There is no tunneling or undermining noted. There is a medium amount of serous drainage noted. The wound margin is distinct with the outline attached to the wound base. There is large (67-100%) red granulation within the wound bed. There is no necrotic tissue within the wound bed. Wound #14 status is  Open. Original cause of wound was Gradually Appeared. The date acquired was: 11/01/2020. The wound has been in treatment 1 weeks. The wound is located on the Right,Medial Lower Leg. The wound measures 0.1cm length x 0.1cm width x 0.1cm depth; 0.008cm^2 area and 0.001cm^3 volume. There is Fat Layer (Subcutaneous Tissue) exposed. There is no tunneling or undermining noted. There is a medium amount of serous drainage noted. The wound margin is distinct with the outline attached to the wound base. There is large (67-100%) red granulation within the wound bed. There is no necrotic tissue within the wound bed. Wound #15 status is Open. Original cause of wound was Gradually Appeared. The date acquired was: 11/08/2020. The wound is located on the Right,Posterior Lower Leg. The wound measures  2cm length x 2.5cm width x 0.1cm depth; 3.927cm^2 area and 0.393cm^3 volume. There is Fat Layer (Subcutaneous Tissue) exposed. There is no tunneling or undermining noted. There is a medium amount of serosanguineous drainage noted. The wound margin is distinct with the outline attached to the wound base. There is large (67-100%) red granulation within the wound bed. There is no necrotic tissue within the wound bed. Wound #16 status is Open. Original cause of wound was Gradually Appeared. The date acquired was: 11/08/2020. The wound is located on the Left,Proximal,Posterior Lower Leg. The wound measures 1cm length x 0.8cm width x 0.1cm depth; 0.628cm^2 area and 0.063cm^3 volume. There is Fat Layer (Subcutaneous Tissue) exposed. There is no tunneling or undermining noted. There is a medium amount of serosanguineous drainage noted. The wound margin is distinct with the outline attached to the wound base. There is large (67-100%) red granulation within the wound bed. There is no necrotic tissue within the wound bed. Wound #17 status is Open. Original cause of wound was Gradually Appeared. The date acquired was: 11/08/2020. The wound is located on the Left,Posterior Lower Leg. The wound measures 1.5cm length x 1.2cm width x 0.1cm depth; 1.414cm^2 area and 0.141cm^3 volume. There is Fat Layer (Subcutaneous Tissue) exposed. There is no tunneling or undermining noted. There is a medium amount of serosanguineous drainage noted. The wound margin is distinct with the outline attached to the wound base. There is large (67-100%) red, pink granulation within the wound bed. There is no necrotic tissue within the wound bed. Wound #7R status is Open. Original cause of wound was Blister. The date acquired was: 04/20/2020. The wound has been in treatment 21 weeks. The wound is located on the Left,Anterior Lower Leg. The wound measures 0.4cm length x 0.9cm width x 0.1cm depth; 0.283cm^2 area and 0.028cm^3 volume. There is  Fat Layer (Subcutaneous Tissue) exposed. There is no tunneling or undermining noted. There is a medium amount of serosanguineous drainage noted. The wound margin is distinct with the outline attached to the wound base. There is large (67-100%) red, pink granulation within the wound bed. There is no necrotic tissue within the wound bed. Wound #8 status is Open. Original cause of wound was Gradually Appeared. The date acquired was: 09/27/2020. The wound has been in treatment 6 weeks. The wound is located on the Right,Lateral Lower Leg. The wound measures 0.6cm length x 1.1cm width x 0.1cm depth; 0.518cm^2 area and 0.052cm^3 volume. There is Fat Layer (Subcutaneous Tissue) exposed. There is no tunneling or undermining noted. There is a medium amount of serous drainage noted. The wound margin is distinct with the outline attached to the wound base. There is large (67-100%) red, pink granulation within the wound bed. There is no  necrotic tissue within the wound bed. Wound #9 status is Open. Original cause of wound was Gradually Appeared. The date acquired was: 10/04/2020. The wound has been in treatment 5 weeks. The wound is located on the Left,Distal,Posterior Lower Leg. The wound measures 5.2cm length x 3.7cm width x 0.1cm depth; 15.111cm^2 area and 1.511cm^3 volume. There is Fat Layer (Subcutaneous Tissue) exposed. There is no tunneling or undermining noted. There is a medium amount of serosanguineous drainage noted. The wound margin is distinct with the outline attached to the wound base. There is large (67-100%) red, pink granulation within the wound bed. There is no necrotic tissue within the wound bed. Assessment Active Problems ICD-10 Squamous cell carcinoma of skin, unspecified Non-pressure chronic ulcer of other part of right lower leg with unspecified severity Non-pressure chronic ulcer of other part of left lower leg with unspecified severity Venous insufficiency (chronic)  (peripheral) Patient's wounds are overall stable. She still has scattered open wounds limited to skin breakdown. This is attributed to her lymphedema and chronic insufficiency. I recommended continuing with silver alginate under 3 layer compression. She states she will follow-up with dermatology for her scattered skin lesions to assure there are no more suspicious areas of skin cancer. Unfortunately she went to dermatology this week but she did not have her legs evaluated. She states she will call to be reassessed for her legs. Procedures Wound #10 Pre-procedure diagnosis of Wound #10 is a Venous Leg Ulcer located on the Left,Distal,Medial Lower Leg . There was a Three Layer Compression Therapy Procedure by Lorrin Jackson, RN. Post procedure Diagnosis Wound #10: Same as Pre-Procedure Wound #11 Pre-procedure diagnosis of Wound #11 is a Venous Leg Ulcer located on the Left,Proximal,Medial Lower Leg . There was a Three Layer Compression Therapy Procedure by Lorrin Jackson, RN. Post procedure Diagnosis Wound #11: Same as Pre-Procedure Wound #12 Pre-procedure diagnosis of Wound #12 is a Venous Leg Ulcer located on the Left,Lateral Lower Leg . There was a Three Layer Compression Therapy Procedure by Lorrin Jackson, RN. Post procedure Diagnosis Wound #12: Same as Pre-Procedure Wound #13 Pre-procedure diagnosis of Wound #13 is a Venous Leg Ulcer located on the Right,Proximal,Lateral Lower Leg . There was a Three Layer Compression Therapy Procedure by Lorrin Jackson, RN. Post procedure Diagnosis Wound #13: Same as Pre-Procedure Wound #14 Pre-procedure diagnosis of Wound #14 is a Venous Leg Ulcer located on the Right,Medial Lower Leg . There was a Three Layer Compression Therapy Procedure by Lorrin Jackson, RN. Post procedure Diagnosis Wound #14: Same as Pre-Procedure Wound #15 Pre-procedure diagnosis of Wound #15 is a Venous Leg Ulcer located on the Right,Posterior Lower Leg . There was a Three  Layer Compression Therapy Procedure by Lorrin Jackson, RN. Post procedure Diagnosis Wound #15: Same as Pre-Procedure Wound #16 Pre-procedure diagnosis of Wound #16 is a Venous Leg Ulcer located on the Left,Proximal,Posterior Lower Leg . There was a Three Layer Compression Therapy Procedure by Lorrin Jackson, RN. Post procedure Diagnosis Wound #16: Same as Pre-Procedure Wound #17 Pre-procedure diagnosis of Wound #17 is a Venous Leg Ulcer located on the Left,Posterior Lower Leg . There was a Three Layer Compression Therapy Procedure by Lorrin Jackson, RN. Post procedure Diagnosis Wound #17: Same as Pre-Procedure Wound #7R Pre-procedure diagnosis of Wound #7R is a Malignant Wound located on the Left,Anterior Lower Leg . There was a Three Layer Compression Therapy Procedure by Lorrin Jackson, RN. Post procedure Diagnosis Wound #7R: Same as Pre-Procedure Wound #8 Pre-procedure diagnosis of Wound #8 is a Venous Leg Ulcer  located on the Right,Lateral Lower Leg . There was a Three Layer Compression Therapy Procedure by Lorrin Jackson, RN. Post procedure Diagnosis Wound #8: Same as Pre-Procedure Wound #9 Pre-procedure diagnosis of Wound #9 is a Venous Leg Ulcer located on the Left,Distal,Posterior Lower Leg . There was a Three Layer Compression Therapy Procedure by Lorrin Jackson, RN. Post procedure Diagnosis Wound #9: Same as Pre-Procedure Plan Follow-up Appointments: Return Appointment in 1 week. - with Dr. Heber [place] Bathing/ Shower/ Hygiene: May shower with protection but do not get wound dressing(s) wet. - Use cast protector Edema Control - Lymphedema / SCD / Other: Elevate legs to the level of the heart or above for 30 minutes daily and/or when sitting, a frequency of: - elevate the legs throughout the day heart level if possible. Avoid standing for long periods of time. Exercise regularly Additional Orders / Instructions: Follow Nutritious Diet Services and Therapies ordered  were: Venous Studies -Bilateral - Venous Reflux Studies, Bilateral Legs ICD: L97.829. L97.819, I87.2 CPT : WOUND #10: - Lower Leg Wound Laterality: Left, Medial, Distal Cleanser: Soap and Water 1 x Per Week/15 Days Discharge Instructions: May shower and wash wound with dial antibacterial soap and water prior to dressing change. Cleanser: Wound Cleanser 1 x Per Week/15 Days Discharge Instructions: Cleanse the wound with wound cleanser prior to applying a clean dressing using gauze sponges, not tissue or cotton balls. Peri-Wound Care: Triamcinolone 15 (g) 1 x Per Week/15 Days Discharge Instructions: In clinic only. Use triamcinolone 15 (g) in clinic only. Peri-Wound Care: Sween Lotion (Moisturizing lotion) 1 x Per Week/15 Days Discharge Instructions: Apply moisturizing lotion as directed Prim Dressing: KerraCel Ag Gelling Fiber Dressing, 4x5 in (silver alginate) 1 x Per Week/15 Days ary Discharge Instructions: Apply silver alginate to wound bed as instructed Secondary Dressing: Woven Gauze Sponge, Non-Sterile 4x4 in 1 x Per Week/15 Days Discharge Instructions: Apply over primary dressing as directed. Secondary Dressing: ABD Pad, 8x10 1 x Per Week/15 Days Discharge Instructions: Apply over primary dressing as directed. Com pression Wrap: ThreePress (3 layer compression wrap) 1 x Per Week/15 Days Discharge Instructions: Apply three layer compression as directed. WOUND #11: - Lower Leg Wound Laterality: Left, Medial, Proximal Cleanser: Soap and Water 1 x Per Week/15 Days Discharge Instructions: May shower and wash wound with dial antibacterial soap and water prior to dressing change. Cleanser: Wound Cleanser 1 x Per Week/15 Days Discharge Instructions: Cleanse the wound with wound cleanser prior to applying a clean dressing using gauze sponges, not tissue or cotton balls. Peri-Wound Care: Triamcinolone 15 (g) 1 x Per Week/15 Days Discharge Instructions: In clinic only. Use triamcinolone 15 (g)  in clinic only. Peri-Wound Care: Sween Lotion (Moisturizing lotion) 1 x Per Week/15 Days Discharge Instructions: Apply moisturizing lotion as directed Prim Dressing: KerraCel Ag Gelling Fiber Dressing, 4x5 in (silver alginate) 1 x Per Week/15 Days ary Discharge Instructions: Apply silver alginate to wound bed as instructed Secondary Dressing: Woven Gauze Sponge, Non-Sterile 4x4 in 1 x Per Week/15 Days Discharge Instructions: Apply over primary dressing as directed. Secondary Dressing: ABD Pad, 8x10 1 x Per Week/15 Days Discharge Instructions: Apply over primary dressing as directed. Com pression Wrap: ThreePress (3 layer compression wrap) 1 x Per Week/15 Days Discharge Instructions: Apply three layer compression as directed. WOUND #12: - Lower Leg Wound Laterality: Left, Lateral Cleanser: Soap and Water 1 x Per Week/15 Days Discharge Instructions: May shower and wash wound with dial antibacterial soap and water prior to dressing change. Cleanser: Wound Cleanser 1 x Per  Week/15 Days Discharge Instructions: Cleanse the wound with wound cleanser prior to applying a clean dressing using gauze sponges, not tissue or cotton balls. Peri-Wound Care: Triamcinolone 15 (g) 1 x Per Week/15 Days Discharge Instructions: In clinic only. Use triamcinolone 15 (g) in clinic only. Peri-Wound Care: Sween Lotion (Moisturizing lotion) 1 x Per Week/15 Days Discharge Instructions: Apply moisturizing lotion as directed Prim Dressing: KerraCel Ag Gelling Fiber Dressing, 4x5 in (silver alginate) 1 x Per Week/15 Days ary Discharge Instructions: Apply silver alginate to wound bed as instructed Secondary Dressing: Woven Gauze Sponge, Non-Sterile 4x4 in 1 x Per Week/15 Days Discharge Instructions: Apply over primary dressing as directed. Secondary Dressing: ABD Pad, 8x10 1 x Per Week/15 Days Discharge Instructions: Apply over primary dressing as directed. Com pression Wrap: ThreePress (3 layer compression wrap) 1 x Per  Week/15 Days Discharge Instructions: Apply three layer compression as directed. WOUND #13: - Lower Leg Wound Laterality: Right, Lateral, Proximal Cleanser: Soap and Water 1 x Per Week/15 Days Discharge Instructions: May shower and wash wound with dial antibacterial soap and water prior to dressing change. Cleanser: Wound Cleanser 1 x Per Week/15 Days Discharge Instructions: Cleanse the wound with wound cleanser prior to applying a clean dressing using gauze sponges, not tissue or cotton balls. Peri-Wound Care: Triamcinolone 15 (g) 1 x Per Week/15 Days Discharge Instructions: In clinic only. Use triamcinolone 15 (g) in clinic only. Peri-Wound Care: Sween Lotion (Moisturizing lotion) 1 x Per Week/15 Days Discharge Instructions: Apply moisturizing lotion as directed Prim Dressing: KerraCel Ag Gelling Fiber Dressing, 4x5 in (silver alginate) 1 x Per Week/15 Days ary Discharge Instructions: Apply silver alginate to wound bed as instructed Secondary Dressing: Woven Gauze Sponge, Non-Sterile 4x4 in 1 x Per Week/15 Days Discharge Instructions: Apply over primary dressing as directed. Secondary Dressing: ABD Pad, 8x10 1 x Per Week/15 Days Discharge Instructions: Apply over primary dressing as directed. Com pression Wrap: ThreePress (3 layer compression wrap) 1 x Per Week/15 Days Discharge Instructions: Apply three layer compression as directed. WOUND #14: - Lower Leg Wound Laterality: Right, Medial Cleanser: Soap and Water 1 x Per Week/15 Days Discharge Instructions: May shower and wash wound with dial antibacterial soap and water prior to dressing change. Cleanser: Wound Cleanser 1 x Per Week/15 Days Discharge Instructions: Cleanse the wound with wound cleanser prior to applying a clean dressing using gauze sponges, not tissue or cotton balls. Peri-Wound Care: Triamcinolone 15 (g) 1 x Per Week/15 Days Discharge Instructions: In clinic only. Use triamcinolone 15 (g) in clinic only. Peri-Wound Care:  Sween Lotion (Moisturizing lotion) 1 x Per Week/15 Days Discharge Instructions: Apply moisturizing lotion as directed Prim Dressing: KerraCel Ag Gelling Fiber Dressing, 4x5 in (silver alginate) 1 x Per Week/15 Days ary Discharge Instructions: Apply silver alginate to wound bed as instructed Secondary Dressing: Woven Gauze Sponge, Non-Sterile 4x4 in 1 x Per Week/15 Days Discharge Instructions: Apply over primary dressing as directed. Secondary Dressing: ABD Pad, 8x10 1 x Per Week/15 Days Discharge Instructions: Apply over primary dressing as directed. Com pression Wrap: ThreePress (3 layer compression wrap) 1 x Per Week/15 Days Discharge Instructions: Apply three layer compression as directed. WOUND #15: - Lower Leg Wound Laterality: Right, Posterior Cleanser: Soap and Water 1 x Per Week/15 Days Discharge Instructions: May shower and wash wound with dial antibacterial soap and water prior to dressing change. Cleanser: Wound Cleanser 1 x Per Week/15 Days Discharge Instructions: Cleanse the wound with wound cleanser prior to applying a clean dressing using gauze sponges, not tissue  or cotton balls. Peri-Wound Care: Triamcinolone 15 (g) 1 x Per Week/15 Days Discharge Instructions: In clinic only. Use triamcinolone 15 (g) in clinic only. Peri-Wound Care: Sween Lotion (Moisturizing lotion) 1 x Per Week/15 Days Discharge Instructions: Apply moisturizing lotion as directed Prim Dressing: KerraCel Ag Gelling Fiber Dressing, 4x5 in (silver alginate) 1 x Per Week/15 Days ary Discharge Instructions: Apply silver alginate to wound bed as instructed Secondary Dressing: Woven Gauze Sponge, Non-Sterile 4x4 in 1 x Per Week/15 Days Discharge Instructions: Apply over primary dressing as directed. Secondary Dressing: ABD Pad, 8x10 1 x Per Week/15 Days Discharge Instructions: Apply over primary dressing as directed. Com pression Wrap: ThreePress (3 layer compression wrap) 1 x Per Week/15 Days Discharge  Instructions: Apply three layer compression as directed. WOUND #16: - Lower Leg Wound Laterality: Left, Posterior, Proximal Cleanser: Soap and Water 1 x Per Week/15 Days Discharge Instructions: May shower and wash wound with dial antibacterial soap and water prior to dressing change. Cleanser: Wound Cleanser 1 x Per Week/15 Days Discharge Instructions: Cleanse the wound with wound cleanser prior to applying a clean dressing using gauze sponges, not tissue or cotton balls. Peri-Wound Care: Triamcinolone 15 (g) 1 x Per Week/15 Days Discharge Instructions: In clinic only. Use triamcinolone 15 (g) in clinic only. Peri-Wound Care: Sween Lotion (Moisturizing lotion) 1 x Per Week/15 Days Discharge Instructions: Apply moisturizing lotion as directed Prim Dressing: KerraCel Ag Gelling Fiber Dressing, 4x5 in (silver alginate) 1 x Per Week/15 Days ary Discharge Instructions: Apply silver alginate to wound bed as instructed Secondary Dressing: Woven Gauze Sponge, Non-Sterile 4x4 in 1 x Per Week/15 Days Discharge Instructions: Apply over primary dressing as directed. Secondary Dressing: ABD Pad, 8x10 1 x Per Week/15 Days Discharge Instructions: Apply over primary dressing as directed. Com pression Wrap: ThreePress (3 layer compression wrap) 1 x Per Week/15 Days Discharge Instructions: Apply three layer compression as directed. WOUND #17: - Lower Leg Wound Laterality: Left, Posterior Cleanser: Soap and Water 1 x Per Week/15 Days Discharge Instructions: May shower and wash wound with dial antibacterial soap and water prior to dressing change. Cleanser: Wound Cleanser 1 x Per Week/15 Days Discharge Instructions: Cleanse the wound with wound cleanser prior to applying a clean dressing using gauze sponges, not tissue or cotton balls. Peri-Wound Care: Triamcinolone 15 (g) 1 x Per Week/15 Days Discharge Instructions: In clinic only. Use triamcinolone 15 (g) in clinic only. Peri-Wound Care: Sween Lotion  (Moisturizing lotion) 1 x Per Week/15 Days Discharge Instructions: Apply moisturizing lotion as directed Prim Dressing: KerraCel Ag Gelling Fiber Dressing, 4x5 in (silver alginate) 1 x Per Week/15 Days ary Discharge Instructions: Apply silver alginate to wound bed as instructed Secondary Dressing: Woven Gauze Sponge, Non-Sterile 4x4 in 1 x Per Week/15 Days Discharge Instructions: Apply over primary dressing as directed. Secondary Dressing: ABD Pad, 8x10 1 x Per Week/15 Days Discharge Instructions: Apply over primary dressing as directed. Com pression Wrap: ThreePress (3 layer compression wrap) 1 x Per Week/15 Days Discharge Instructions: Apply three layer compression as directed. WOUND #7R: - Lower Leg Wound Laterality: Left, Anterior Cleanser: Soap and Water 1 x Per Week/15 Days Discharge Instructions: May shower and wash wound with dial antibacterial soap and water prior to dressing change. Cleanser: Wound Cleanser 1 x Per Week/15 Days Discharge Instructions: Cleanse the wound with wound cleanser prior to applying a clean dressing using gauze sponges, not tissue or cotton balls. Peri-Wound Care: Triamcinolone 15 (g) 1 x Per Week/15 Days Discharge Instructions: In clinic only. Use triamcinolone 15 (  g) in clinic only. Peri-Wound Care: Sween Lotion (Moisturizing lotion) 1 x Per Week/15 Days Discharge Instructions: Apply moisturizing lotion as directed Prim Dressing: KerraCel Ag Gelling Fiber Dressing, 4x5 in (silver alginate) 1 x Per Week/15 Days ary Discharge Instructions: Apply silver alginate to wound bed as instructed Secondary Dressing: Woven Gauze Sponge, Non-Sterile 4x4 in 1 x Per Week/15 Days Discharge Instructions: Apply over primary dressing as directed. Secondary Dressing: ABD Pad, 8x10 1 x Per Week/15 Days Discharge Instructions: Apply over primary dressing as directed. Com pression Wrap: ThreePress (3 layer compression wrap) 1 x Per Week/15 Days Discharge Instructions: Apply  three layer compression as directed. WOUND #8: - Lower Leg Wound Laterality: Right, Lateral Cleanser: Soap and Water 1 x Per Week/15 Days Discharge Instructions: May shower and wash wound with dial antibacterial soap and water prior to dressing change. Cleanser: Wound Cleanser 1 x Per Week/15 Days Discharge Instructions: Cleanse the wound with wound cleanser prior to applying a clean dressing using gauze sponges, not tissue or cotton balls. Peri-Wound Care: Triamcinolone 15 (g) 1 x Per Week/15 Days Discharge Instructions: In clinic only. Use triamcinolone 15 (g) in clinic only. Peri-Wound Care: Sween Lotion (Moisturizing lotion) 1 x Per Week/15 Days Discharge Instructions: Apply moisturizing lotion as directed Prim Dressing: KerraCel Ag Gelling Fiber Dressing, 4x5 in (silver alginate) 1 x Per Week/15 Days ary Discharge Instructions: Apply silver alginate to wound bed as instructed Secondary Dressing: Woven Gauze Sponge, Non-Sterile 4x4 in 1 x Per Week/15 Days Discharge Instructions: Apply over primary dressing as directed. Secondary Dressing: ABD Pad, 8x10 1 x Per Week/15 Days Discharge Instructions: Apply over primary dressing as directed. Com pression Wrap: ThreePress (3 layer compression wrap) 1 x Per Week/15 Days Discharge Instructions: Apply three layer compression as directed. WOUND #9: - Lower Leg Wound Laterality: Left, Posterior, Distal Cleanser: Soap and Water 1 x Per Week/15 Days Discharge Instructions: May shower and wash wound with dial antibacterial soap and water prior to dressing change. Cleanser: Wound Cleanser 1 x Per Week/15 Days Discharge Instructions: Cleanse the wound with wound cleanser prior to applying a clean dressing using gauze sponges, not tissue or cotton balls. Peri-Wound Care: Triamcinolone 15 (g) 1 x Per Week/15 Days Discharge Instructions: In clinic only. Use triamcinolone 15 (g) in clinic only. Peri-Wound Care: Sween Lotion (Moisturizing lotion) 1 x Per  Week/15 Days Discharge Instructions: Apply moisturizing lotion as directed Prim Dressing: KerraCel Ag Gelling Fiber Dressing, 4x5 in (silver alginate) 1 x Per Week/15 Days ary Discharge Instructions: Apply silver alginate to wound bed as instructed Secondary Dressing: Woven Gauze Sponge, Non-Sterile 4x4 in 1 x Per Week/15 Days Discharge Instructions: Apply over primary dressing as directed. Secondary Dressing: ABD Pad, 8x10 1 x Per Week/15 Days Discharge Instructions: Apply over primary dressing as directed. Com pression Wrap: ThreePress (3 layer compression wrap) 1 x Per Week/15 Days Discharge Instructions: Apply three layer compression as directed. 1. 3 layer compression with silver alginate 2. Follow-up in 1 week Electronic Signature(s) Signed: 11/09/2020 1:46:45 PM By: Kalman Shan DO Entered By: Kalman Shan on 11/09/2020 13:46:09 -------------------------------------------------------------------------------- HxROS Details Patient Name: Date of Service: [name], [place]. 11/08/2020 11:00 A M Medical Record Number: [id number] Patient Account Number: [id number] Date of Birth/Sex: Treating RN: 05-16-1925 (87 y.o. [name] Primary Care Provider: [name] Other Clinician: Referring Provider: Treating Provider/Extender: [name] in Treatment: 21 Information Obtained From Patient Eyes Medical History: Negative for: Cataracts; Optic Neuritis Ear/Nose/Mouth/Throat Medical History: Negative for: Chronic sinus  problems/congestion; Middle ear problems Hematologic/Lymphatic Medical History: Positive for: Anemia Negative for: Hemophilia; Human Immunodeficiency Virus; Lymphedema; Sickle Cell Disease Respiratory Medical History: Negative for: Aspiration; Asthma; Chronic Obstructive Pulmonary Disease (COPD); Pneumothorax; Sleep Apnea; Tuberculosis Cardiovascular Medical History: Positive for: Arrhythmia - Atrial  Flutter, A fibb; Congestive Heart Failure; Hypertension Negative for: Angina; Coronary Artery Disease; Deep Vein Thrombosis; Hypotension; Myocardial Infarction; Peripheral Arterial Disease; Peripheral Venous Disease; Phlebitis; Vasculitis Past Medical History Notes: hyperlipidemia Gastrointestinal Medical History: Positive for: Colitis Negative for: Cirrhosis ; Crohns; Hepatitis A; Hepatitis B; Hepatitis C Endocrine Medical History: Negative for: Type I Diabetes; Type II Diabetes Past Medical History Notes: hypothyroidism Genitourinary Medical History: Negative for: End Stage Renal Disease Immunological Medical History: Negative for: Lupus Erythematosus; Raynauds; Scleroderma Integumentary (Skin) Medical History: Negative for: History of Burn Musculoskeletal Medical History: Positive for: Osteoarthritis Negative for: Gout; Rheumatoid Arthritis; Osteomyelitis Neurologic Medical History: Negative for: Dementia; Neuropathy; Quadriplegia; Paraplegia; Seizure Disorder Past Medical History Notes: lumbar spindylolysis Oncologic Medical History: Negative for: Received Chemotherapy; Received Radiation Past Medical History Notes: BLE squamous ceel carcionoma Immunizations Pneumococcal Vaccine: Received Pneumococcal Vaccination: Yes Received Pneumococcal Vaccination On or After 60th Birthday: No Implantable Devices None Hospitalization / Surgery History Type of Hospitalization/Surgery removal of rod left leg Family and Social History Unknown History: Yes; Never smoker; Marital Status - Single; Alcohol Use: Never; Drug Use: No History; Caffeine Use: Never; Financial Concerns: No; Food, Clothing or Shelter Needs: No; Support System Lacking: No; Transportation Concerns: No Electronic Signature(s) Signed: 11/09/2020 1:46:45 PM By: Kalman Shan DO Signed: 11/13/2020 5:23:14 PM By: Rhae Hammock RN Entered By: Kalman Shan on 11/09/2020  13:42:00 -------------------------------------------------------------------------------- SuperBill Details Patient Name: Date of Service: [name], [place]. 11/08/2020 Medical Record Number: [id number] Patient Account Number: [id number] Date of Birth/Sex: Treating RN: 1926-01-15 (87 y.o. [name] Primary Care Provider: [name] Other Clinician: Referring Provider: Treating Provider/Extender: [name] in Treatment: 21 Diagnosis Coding ICD-10 Codes Code Description C44.92 Squamous cell carcinoma of skin, unspecified L97.819 Non-pressure chronic ulcer of other part of right lower leg with unspecified severity L97.829 Non-pressure chronic ulcer of other part of left lower leg with unspecified severity I87.2 Venous insufficiency (chronic) (peripheral) Facility Procedures CPT4: Code [id number] Description: 81 BILATERAL: Application of multi-layer venous compression system; leg (below knee), including ankle and t. ICD-10 Diagnosis Description L97.819 Non-pressure chronic ulcer of other part of right lower leg with unspecified severity L97.829  Non-pressure chronic ulcer of other part of left lower leg with unspecified severity I87.2 Venous insufficiency (chronic) (peripheral) Modifier: Quantity: 1 Physician Procedures : CPT4 Code Description Modifier [id number] - WC PHYS LEVEL 3 - EST PT ICD-10 Diagnosis Description C44.92 Squamous cell carcinoma of skin, unspecified L97.819 Non-pressure chronic ulcer of other part of right lower leg with unspecified severity  L97.829 Non-pressure chronic ulcer of other part of left lower leg with unspecified severity I87.2 Venous insufficiency (chronic) (peripheral) Quantity: 1 Electronic Signature(s) Signed: 11/09/2020 1:46:45 PM By: Kalman Shan DO Previous Signature: 11/08/2020 5:31:21 PM Version By: Lorrin Jackson Entered By: Kalman Shan on 11/09/2020 13:46:22

## 2020-11-15 ENCOUNTER — Encounter (HOSPITAL_BASED_OUTPATIENT_CLINIC_OR_DEPARTMENT_OTHER): Payer: Medicare Other | Admitting: Internal Medicine

## 2020-11-15 ENCOUNTER — Other Ambulatory Visit: Payer: Self-pay

## 2020-11-15 DIAGNOSIS — L97819 Non-pressure chronic ulcer of other part of right lower leg with unspecified severity: Secondary | ICD-10-CM

## 2020-11-15 DIAGNOSIS — Z85828 Personal history of other malignant neoplasm of skin: Secondary | ICD-10-CM | POA: Diagnosis not present

## 2020-11-15 DIAGNOSIS — C4492 Squamous cell carcinoma of skin, unspecified: Secondary | ICD-10-CM | POA: Diagnosis not present

## 2020-11-15 DIAGNOSIS — L97829 Non-pressure chronic ulcer of other part of left lower leg with unspecified severity: Secondary | ICD-10-CM | POA: Diagnosis not present

## 2020-11-15 DIAGNOSIS — Z8616 Personal history of COVID-19: Secondary | ICD-10-CM | POA: Diagnosis not present

## 2020-11-15 DIAGNOSIS — Z853 Personal history of malignant neoplasm of breast: Secondary | ICD-10-CM | POA: Diagnosis not present

## 2020-11-15 DIAGNOSIS — I872 Venous insufficiency (chronic) (peripheral): Secondary | ICD-10-CM

## 2020-11-16 NOTE — Progress Notes (Signed)
[name] ([id number]) , Visit Report for 11/15/2020 Arrival Information Details Patient Name: Date of Service: [name], [name] 11/15/2020 11:15 A M Medical Record Number: [id number] Patient Account Number: [id number] Date of Birth/Sex: Treating RN: December 30, 1925 (87 y.o. [name] Primary Care Sheika Coutts: [name] Other Clinician: Referring Rogan Ecklund: Treating Zorawar Strollo/Extender: [name] in Treatment: 40 Visit Information History Since Last Visit Added or deleted any medications: No Patient Arrived: Walker Any new allergies or adverse reactions: No Arrival Time: 11:39 Had a fall or experienced change in No Transfer Assistance: None activities of daily living that may affect Patient Identification Verified: Yes risk of falls: Secondary Verification Process Completed: Yes Signs or symptoms of abuse/neglect since last visito No Patient Requires Transmission-Based No Hospitalized since last visit: No Precautions: Implantable device outside of the clinic excluding No Patient Has Alerts: Yes cellular tissue based products placed in the center Patient Alerts: R ABI = Non compressible since last visit: L ABI = Non compressible Has Dressing in Place as Prescribed: Yes Has Compression in Place as Prescribed: Yes Pain Present Now: No Electronic Signature(s) Signed: 11/15/2020 4:35:44 PM By: Lorrin Jackson Entered By: Lorrin Jackson on 11/15/2020 11:43:49 -------------------------------------------------------------------------------- Compression Therapy Details Patient Name: Date of Service: [name], [name] 11/15/2020 11:15 A M Medical Record Number: [id number] Patient Account Number: [id number] Date of Birth/Sex: Treating RN: Feb 10, 1925 (87 y.o. [name] Primary Care Michai Dieppa: [name] Other Clinician: Referring Jerell Demery: Treating Mallory Schaad/Extender: [name] in  Treatment: 22 Compression Therapy Performed for Wound Assessment: Wound #10 Left,Distal,Medial Lower Leg Performed By: Clinician Lorrin Jackson, RN Compression Type: Three Layer Post Procedure Diagnosis Same as Pre-procedure Electronic Signature(s) Signed: 11/15/2020 4:35:44 PM By: Lorrin Jackson Entered By: Lorrin Jackson on 11/15/2020 12:09:50 -------------------------------------------------------------------------------- Compression Therapy Details Patient Name: Date of Service: [name], [name] 11/15/2020 11:15 A M Medical Record Number: [id number] Patient Account Number: [id number] Date of Birth/Sex: Treating RN: Dec 17, 1925 (87 y.o. [name] Primary Care Kyson Kupper: [name] Other Clinician: Referring Hatsuko Bizzarro: Treating Winnifred Dufford/Extender: [name] in Treatment: 22 Compression Therapy Performed for Wound Assessment: Wound #11 Left,Proximal,Medial Lower Leg Performed By: Clinician Lorrin Jackson, RN Compression Type: Three Layer Post Procedure Diagnosis Same as Pre-procedure Electronic Signature(s) Signed: 11/15/2020 4:35:44 PM By: Lorrin Jackson Entered By: Lorrin Jackson on 11/15/2020 12:09:50 -------------------------------------------------------------------------------- Compression Therapy Details Patient Name: Date of Service: [name], [name] 11/15/2020 11:15 A M Medical Record Number: [id number] Patient Account Number: [id number] Date of Birth/Sex: Treating RN: 15-May-1925 (87 y.o. [name] Primary Care Jacquilyn Seldon: [name] Other Clinician: Referring Charmaine Placido: Treating Lakeem Rozo/Extender: [name] in Treatment: 22 Compression Therapy Performed for Wound Assessment: Wound #12 Left,Lateral Lower Leg Performed By: Clinician Lorrin Jackson, RN Compression Type: Three Layer Post Procedure Diagnosis Same as Pre-procedure Electronic Signature(s) Signed:  11/15/2020 4:35:44 PM By: Lorrin Jackson Entered By: Lorrin Jackson on 11/15/2020 12:09:50 -------------------------------------------------------------------------------- Compression Therapy Details Patient Name: Date of Service: [name], [name] 11/15/2020 11:15 A M Medical Record Number: [id number] Patient Account Number: [id number] Date of Birth/Sex: Treating RN: [birth year] (87 y.o. [name] Primary Care Montrel Donahoe: [name] Other Clinician: Referring Tarik Teixeira: Treating Eero Dini/Extender: [name] in Treatment: 22 Compression Therapy Performed for Wound Assessment: Wound #13 Right,Proximal,Lateral Lower Leg Performed By: Clinician Lorrin Jackson, RN Compression Type: Three Layer Post Procedure Diagnosis Same as Pre-procedure Electronic Signature(s) Signed: 11/15/2020 4:35:44 PM  By: Lorrin Jackson Entered By: Lorrin Jackson on 11/15/2020 12:09:50 -------------------------------------------------------------------------------- Compression Therapy Details Patient Name: Date of Service: [name], [name] 11/15/2020 11:15 A M Medical Record Number: [id number] Patient Account Number: [id number] Date of Birth/Sex: Treating RN: [birth year] (87 y.o. [name] Primary Care Jevon Shells: [name] Other Clinician: Referring Ambrose Wile: Treating Avina Eberle/Extender: [name] in Treatment: 22 Compression Therapy Performed for Wound Assessment: Wound #15 Right,Posterior Lower Leg Performed By: Clinician Lorrin Jackson, RN Compression Type: Three Layer Post Procedure Diagnosis Same as Pre-procedure Electronic Signature(s) Signed: 11/15/2020 4:35:44 PM By: Lorrin Jackson Entered By: Lorrin Jackson on 11/15/2020 12:09:50 -------------------------------------------------------------------------------- Compression Therapy Details Patient Name: Date of Service: [name], [name]  11/15/2020 11:15 A M Medical Record Number: [id number] Patient Account Number: [id number] Date of Birth/Sex: Treating RN: Jun 14, 1925 (87 y.o. [name] Primary Care Naavya Postma: [name] Other Clinician: Referring Calisa Luckenbaugh: Treating Jenavieve Freda/Extender: [name] in Treatment: 22 Compression Therapy Performed for Wound Assessment: Wound #17 Left,Posterior Lower Leg Performed By: Clinician Lorrin Jackson, RN Compression Type: Three Layer Post Procedure Diagnosis Same as Pre-procedure Electronic Signature(s) Signed: 11/15/2020 4:35:44 PM By: Lorrin Jackson Entered By: Lorrin Jackson on 11/15/2020 12:09:50 -------------------------------------------------------------------------------- Compression Therapy Details Patient Name: Date of Service: [name], Jessia A. 11/15/2020 11:15 A M Medical Record Number: [id number] Patient Account Number: [id number] Date of Birth/Sex: Treating RN: 21-Jan-1926 (87 y.o. [name] Primary Care Travis Mastel: Other Clinician: [name] Referring Naylah Cork: Treating Deryl Giroux/Extender: [name] in Treatment: 22 Compression Therapy Performed for Wound Assessment: Wound #16 Left,Proximal,Posterior Lower Leg Performed By: Clinician Lorrin Jackson, RN Compression Type: Three Layer Post Procedure Diagnosis Same as Pre-procedure Electronic Signature(s) Signed: 11/15/2020 4:35:44 PM By: Lorrin Jackson Entered By: Lorrin Jackson on 11/15/2020 12:09:51 -------------------------------------------------------------------------------- Compression Therapy Details Patient Name: Date of Service: [name], Branna A. 11/15/2020 11:15 A M Medical Record Number: [id number] Patient Account Number: [id number] Date of Birth/Sex: Treating RN: 11/17/1925 (87 y.o. [name] Primary Care Brendalee Matthies: [name] Other Clinician: Referring Gavynn Duvall: Treating  Jadin Kagel/Extender: [name] in Treatment: 22 Compression Therapy Performed for Wound Assessment: Wound #7R Left,Anterior Lower Leg Performed By: Clinician Lorrin Jackson, RN Compression Type: Three Layer Post Procedure Diagnosis Same as Pre-procedure Electronic Signature(s) Signed: 11/15/2020 4:35:44 PM By: Lorrin Jackson Entered By: Lorrin Jackson on 11/15/2020 12:09:51 -------------------------------------------------------------------------------- Compression Therapy Details Patient Name: Date of Service: [name], Indiya A. 11/15/2020 11:15 A M Medical Record Number: [id number] Patient Account Number: [id number] Date of Birth/Sex: Treating RN: 08/22/1925 (87 y.o. [name] Primary Care Taran Hable: [name] Other Clinician: Referring Jevonte Clanton: Treating Donnell Wion/Extender: [name] in Treatment: 22 Compression Therapy Performed for Wound Assessment: Wound #8 Right,Lateral Lower Leg Performed By: Clinician Lorrin Jackson, RN Compression Type: Three Layer Post Procedure Diagnosis Same as Pre-procedure Electronic Signature(s) Signed: 11/15/2020 4:35:44 PM By: Lorrin Jackson Entered By: Lorrin Jackson on 11/15/2020 12:09:51 -------------------------------------------------------------------------------- Compression Therapy Details Patient Name: Date of Service: [name], Cystal A. 11/15/2020 11:15 A M Medical Record Number: [id number] Patient Account Number: [id number] Date of Birth/Sex: Treating RN: September 30, 1925 (87 y.o. [name] Primary Care Nathanel Tallman: [name] Other Clinician: Referring Calyb Mcquarrie: Treating Zakiah Gauthreaux/Extender: [name] in Treatment: 22 Compression Therapy Performed for Wound Assessment: Wound #9 Left,Distal,Posterior Lower Leg Performed By: Clinician Lorrin Jackson, RN Compression Type: Three Layer Post Procedure  Diagnosis Same as Pre-procedure Electronic Signature(s) Signed:  11/15/2020 4:35:44 PM By: Lorrin Jackson Entered By: Lorrin Jackson on 11/15/2020 12:09:51 -------------------------------------------------------------------------------- Encounter Discharge Information Details Patient Name: Date of Service: [name], Charmon A. 11/15/2020 11:15 A M Medical Record Number: [id number] Patient Account Number: [id number] Date of Birth/Sex: Treating RN: 12-31-1925 (87 y.o. [name] Primary Care Mahoganie Basher: [name] Other Clinician: Referring Kyleen Villatoro: Treating Celvin Taney/Extender: [name] in Treatment: 22 Encounter Discharge Information Items Discharge Condition: Stable Ambulatory Status: Walker Discharge Destination: Home Transportation: Other Schedule Follow-up Appointment: Yes Clinical Summary of Care: Provided on 11/15/2020 Form Type Recipient Paper Patient Patient Electronic Signature(s) Signed: 11/15/2020 12:51:13 PM By: Lorrin Jackson Entered By: Lorrin Jackson on 11/15/2020 12:51:13 -------------------------------------------------------------------------------- Lower Extremity Assessment Details Patient Name: Date of Service: [name], Tomeka A. 11/15/2020 11:15 A M Medical Record Number: [id number] Patient Account Number: [id number] Date of Birth/Sex: Treating RN: 1925-02-16 (87 y.o. [name] Primary Care Redmond Whittley: [name] Other Clinician: Referring Miaa Latterell: Treating Bonny Vanleeuwen/Extender: [name] in Treatment: 22 Edema Assessment Assessed: [Left: No] [Right: Yes] Edema: [Left: Yes] [Right: Yes] Calf Left: Right: Point of Measurement: 30 cm From Medial Instep 34 cm 32 cm Ankle Left: Right: Point of Measurement: 9 cm From Medial Instep 22 cm 22 cm Vascular Assessment Pulses: Dorsalis Pedis Palpable: [Left:Yes] [Right:Yes] Electronic Signature(s) Signed:  11/15/2020 4:35:44 PM By: Lorrin Jackson Entered By: Lorrin Jackson on 11/15/2020 11:58:23 -------------------------------------------------------------------------------- Multi Wound Chart Details Patient Name: Date of Service: [name], [place]. 11/15/2020 11:15 A M Medical Record Number: [id number] Patient Account Number: [id number] Date of Birth/Sex: Treating RN: 1925/03/26 (87 y.o. [name], [name] Primary Care Janett Kamath: [name] Other Clinician: Referring Mikki Ziff: Treating Sesar Madewell/Extender: [name] in Treatment: 22 Vital Signs Height(in): 35 Pulse(bpm): 75 Weight(lbs): 163 Blood Pressure(mmHg): 125/63 Body Mass Index(BMI): 27 Temperature(F): 97.5 Respiratory Rate(breaths/min): 18 Photos: [10:No Photos Left, Distal, Medial Lower Leg] [11:No Photos Left, Proximal, Medial Lower Leg] [12:No Photos Left, Lateral Lower Leg] Wound Location: [10:Gradually Appeared] [11:Gradually Appeared] [12:Gradually Appeared] Wounding Event: [10:Venous Leg Ulcer] [11:Venous Leg Ulcer] [12:Venous Leg Ulcer] Primary Etiology: [10:Anemia, Arrhythmia, Congestive Heart Anemia, Arrhythmia, Congestive Heart Anemia, Arrhythmia, Congestive Heart] Comorbid History: [10:Failure, Hypertension, Colitis, Osteoarthritis 10/11/2020] [11:Failure, Hypertension, Colitis, Osteoarthritis 11/01/2020] [12:Failure, Hypertension, Colitis, Osteoarthritis 11/01/2020] Date Acquired: [10:5] [11:2] [12:2] Weeks of Treatment: [10:Open] [11:Open] [12:Open] Wound Status: [10:No] [11:No] [12:No] Wound Recurrence: [10:No] [11:Yes] [12:Yes] Clustered Wound: [10:N/A] [11:N/A] [12:3] Clustered Quantity: [10:1.2x1.6x0.1] [11:1.1x0.4x0.1] [12:0.5x0.7x0.1] Measurements L x W x D (cm) [10:1.508] [11:0.346] [12:0.275] A (cm) : rea [10:0.151] [11:0.035] [12:0.027] Volume (cm) : [10:-220.20%] [11:99.00%] [12:95.90%] % Reduction in Area: [10:-221.30%] [11:98.90%] [12:96.00%] %  Reduction in Volume: [10:Full Thickness Without Exposed] [11:Full Thickness Without Exposed] [12:Full Thickness Without Exposed] Classification: [10:Support Structures Medium] [11:Support Structures Medium] [12:Support Structures Medium] Exudate Amount: [10:Serosanguineous] [11:Serous] [12:Serous] Exudate Type: [10:red, brown] [11:amber] [12:amber] Exudate Color: [10:Distinct, outline attached] [11:Distinct, outline attached] [12:Distinct, outline attached] Wound Margin: [10:Large (67-100%)] [11:Large (67-100%)] [12:Large (67-100%)] Granulation Amount: [10:Red, Pink] [11:Red, Pink] [12:Red, Pink] Granulation Quality: [10:None Present (0%)] [11:None Present (0%)] [12:None Present (0%)] Necrotic Amount: [10:Fat Layer (Subcutaneous Tissue): Yes Fat Layer (Subcutaneous Tissue): Yes Fat Layer (Subcutaneous Tissue): Yes] Exposed Structures: [10:Fascia: No Tendon: No Muscle: No Joint: No Bone: No None] [11:Fascia: No Tendon: No Muscle: No Joint: No Bone: No Large (67-100%)] [12:Fascia: No Tendon: No Muscle: No Joint: No Bone: No Medium (34-66%)] Epithelialization: [10:Compression Therapy] [11:Compression Therapy] [12:Compression Therapy] Wound Number: [id number]  Photos: No Photos No  Photos Right, Proximal, Lateral Lower Leg Right, Medial Lower Leg Right, Posterior Lower Leg Wound Location: Gradually Appeared Gradually Appeared Gradually Appeared Wounding Event: Venous Leg Ulcer Venous Leg Ulcer Venous Leg Ulcer Primary Etiology: Anemia, Arrhythmia, Congestive Heart Anemia, Arrhythmia, Congestive Heart Anemia, Arrhythmia, Congestive Heart Comorbid History: Failure, Hypertension, Colitis, Failure, Hypertension, Colitis, Failure, Hypertension, Colitis, Osteoarthritis Osteoarthritis Osteoarthritis 11/01/2020 11/01/2020 11/08/2020 Date Acquired: 2 2 1  Weeks of Treatment: Open Healed - Epithelialized Open Wound Status: No No No Wound Recurrence: Yes Yes Yes Clustered Wound: 3 N/A N/A Clustered  Quantity: 0.3x0.3x0.1 0x0x0 2x2.5x0.1 Measurements L x W x D (cm) 0.071 0 3.927 A (cm) : rea 0.007 0 0.393 Volume (cm) : 98.90% 100.00% 0.00% % Reduction in Area: 99.00% 100.00% 0.00% % Reduction in Volume: Full Thickness Without Exposed Full Thickness Without Exposed Full Thickness Without Exposed Classification: Support Structures Support Structures Support Structures Medium N/A Medium Exudate Amount: Serous N/A Serosanguineous Exudate Type: amber N/A red, brown Exudate Color: Distinct, outline attached N/A Distinct, outline attached Wound Margin: Large (67-100%) N/A Large (67-100%) Granulation Amount: Red N/A Red Granulation Quality: None Present (0%) N/A None Present (0%) Necrotic Amount: Fat Layer (Subcutaneous Tissue): Yes N/A Fat Layer (Subcutaneous Tissue): Yes Exposed Structures: Fascia: No Fascia: No Tendon: No Tendon: No Muscle: No Muscle: No Joint: No Joint: No Bone: No Bone: No Large (67-100%) N/A None Epithelialization: Compression Therapy N/A Compression Therapy Procedures Performed: Wound Number: [id number] Photos: No Photos No Photos No Photos Left, Proximal, Posterior Lower Leg Left, Posterior Lower Leg Left, Anterior Lower Leg Wound Location: Gradually Appeared Gradually Appeared Blister Wounding Event: Venous Leg Ulcer Venous Leg Ulcer Malignant Wound Primary Etiology: Anemia, Arrhythmia, Congestive Heart Anemia, Arrhythmia, Congestive Heart Anemia, Arrhythmia, Congestive Heart Comorbid History: Failure, Hypertension, Colitis, Failure, Hypertension, Colitis, Failure, Hypertension, Colitis, Osteoarthritis Osteoarthritis Osteoarthritis 11/08/2020 11/08/2020 04/20/2020 Date Acquired: 1 1 22  Weeks of Treatment: Open Open Open Wound Status: No No Yes Wound Recurrence: No No No Clustered Wound: N/A N/A N/A Clustered Quantity: 1x0.8x0.1 1.5x1.2x0.1 0.4x0.9x0.1 Measurements L x W x D (cm) 0.628 1.414 0.283 A (cm) : rea 0.063 0.141  0.028 Volume (cm) : 0.00% 0.00% 90.80% % Reduction in Area: 0.00% 0.00% 90.90% % Reduction in Volume: Full Thickness Without Exposed Full Thickness Without Exposed Full Thickness Without Exposed Classification: Support Structures Support Structures Support Structures Medium Medium Medium Exudate Amount: Serosanguineous Serosanguineous Serosanguineous Exudate Type: red, brown red, brown red, brown Exudate Color: Distinct, outline attached Distinct, outline attached Distinct, outline attached Wound Margin: Large (67-100%) Large (67-100%) Large (67-100%) Granulation Amount: Red Red, Pink Red, Pink Granulation Quality: None Present (0%) None Present (0%) None Present (0%) Necrotic Amount: Fat Layer (Subcutaneous Tissue): Yes Fat Layer (Subcutaneous Tissue): Yes Fat Layer (Subcutaneous Tissue): Yes Exposed Structures: Fascia: No Fascia: No Fascia: No Tendon: No Tendon: No Tendon: No Muscle: No Muscle: No Muscle: No Joint: No Joint: No Joint: No Bone: No Bone: No Bone: No None None Medium (34-66%) Epithelialization: Compression Therapy Compression Therapy Compression Therapy Procedures Performed: Wound Number: [id number] N/A Photos: No Photos N/A Right, Lateral Lower Leg Left, Distal, Posterior Lower Leg N/A Wound Location: Gradually Appeared Gradually Appeared N/A Wounding Event: Venous Leg Ulcer Venous Leg Ulcer N/A Primary Etiology: Anemia, Arrhythmia, Congestive Heart Anemia, Arrhythmia, Congestive Heart N/A Comorbid History: Failure, Hypertension, Colitis, Failure, Hypertension, Colitis, Osteoarthritis Osteoarthritis 09/27/2020 10/04/2020 N/A Date Acquired: 7 6 N/A Weeks of Treatment: Open Open N/A Wound Status: No No N/A Wound Recurrence: No No N/A Clustered Wound: N/A N/A N/A  Clustered Quantity: 0.6x1.1x0.1 4.3x4x0.1 N/A Measurements L x W x D (cm) 0.518 13.509 N/A A (cm) : rea 0.052 1.351 N/A Volume (cm) : -10.00% -7085.60% N/A % Reduction in  Area: -10.60% -7010.50% N/A % Reduction in Volume: Full Thickness Without Exposed Full Thickness Without Exposed N/A Classification: Support Structures Support Structures Medium Medium N/A Exudate Amount: Serous Serosanguineous N/A Exudate Type: amber red, brown N/A Exudate Color: Distinct, outline attached Distinct, outline attached N/A Wound Margin: Large (67-100%) Large (67-100%) N/A Granulation Amount: Red, Pink Red, Pink N/A Granulation Quality: None Present (0%) Small (1-33%) N/A Necrotic Amount: Fat Layer (Subcutaneous Tissue): Yes Fat Layer (Subcutaneous Tissue): Yes N/A Exposed Structures: Fascia: No Fascia: No Tendon: No Tendon: No Muscle: No Muscle: No Joint: No Joint: No Bone: No Bone: No Medium (34-66%) Medium (34-66%) N/A Epithelialization: Compression Therapy Compression Therapy N/A Procedures Performed: Treatment Notes Wound #10 (Lower Leg) Wound Laterality: Left, Medial, Distal Cleanser Soap and Water Discharge Instruction: May shower and wash wound with dial antibacterial soap and water prior to dressing change. Wound Cleanser Discharge Instruction: Cleanse the wound with wound cleanser prior to applying a clean dressing using gauze sponges, not tissue or cotton balls. Peri-Wound Care Triamcinolone 15 (g) Discharge Instruction: In clinic only. Use triamcinolone 15 (g) in clinic only. Sween Lotion (Moisturizing lotion) Discharge Instruction: Apply moisturizing lotion as directed Topical Primary Dressing KerraCel Ag Gelling Fiber Dressing, 4x5 in (silver alginate) Discharge Instruction: Apply silver alginate to wound bed as instructed Secondary Dressing Woven Gauze Sponge, Non-Sterile 4x4 in Discharge Instruction: Apply over primary dressing as directed. ABD Pad, 8x10 Discharge Instruction: Apply over primary dressing as directed. Secured With Compression Wrap ThreePress (3 layer compression wrap) Discharge Instruction: Apply three layer  compression as directed. Compression Stockings Add-Ons Wound #11 (Lower Leg) Wound Laterality: Left, Medial, Proximal Cleanser Soap and Water Discharge Instruction: May shower and wash wound with dial antibacterial soap and water prior to dressing change. Wound Cleanser Discharge Instruction: Cleanse the wound with wound cleanser prior to applying a clean dressing using gauze sponges, not tissue or cotton balls. Peri-Wound Care Triamcinolone 15 (g) Discharge Instruction: In clinic only. Use triamcinolone 15 (g) in clinic only. Sween Lotion (Moisturizing lotion) Discharge Instruction: Apply moisturizing lotion as directed Topical Primary Dressing KerraCel Ag Gelling Fiber Dressing, 4x5 in (silver alginate) Discharge Instruction: Apply silver alginate to wound bed as instructed Secondary Dressing Woven Gauze Sponge, Non-Sterile 4x4 in Discharge Instruction: Apply over primary dressing as directed. ABD Pad, 8x10 Discharge Instruction: Apply over primary dressing as directed. Secured With Compression Wrap ThreePress (3 layer compression wrap) Discharge Instruction: Apply three layer compression as directed. Compression Stockings Add-Ons Wound #12 (Lower Leg) Wound Laterality: Left, Lateral Cleanser Soap and Water Discharge Instruction: May shower and wash wound with dial antibacterial soap and water prior to dressing change. Wound Cleanser Discharge Instruction: Cleanse the wound with wound cleanser prior to applying a clean dressing using gauze sponges, not tissue or cotton balls. Peri-Wound Care Triamcinolone 15 (g) Discharge Instruction: In clinic only. Use triamcinolone 15 (g) in clinic only. Sween Lotion (Moisturizing lotion) Discharge Instruction: Apply moisturizing lotion as directed Topical Primary Dressing KerraCel Ag Gelling Fiber Dressing, 4x5 in (silver alginate) Discharge Instruction: Apply silver alginate to wound bed as instructed Secondary Dressing Woven Gauze  Sponge, Non-Sterile 4x4 in Discharge Instruction: Apply over primary dressing as directed. ABD Pad, 8x10 Discharge Instruction: Apply over primary dressing as directed. Secured With Compression Wrap ThreePress (3 layer compression wrap) Discharge Instruction: Apply three layer compression as directed.  Compression Stockings Add-Ons Wound #13 (Lower Leg) Wound Laterality: Right, Lateral, Proximal Cleanser Soap and Water Discharge Instruction: May shower and wash wound with dial antibacterial soap and water prior to dressing change. Wound Cleanser Discharge Instruction: Cleanse the wound with wound cleanser prior to applying a clean dressing using gauze sponges, not tissue or cotton balls. Peri-Wound Care Triamcinolone 15 (g) Discharge Instruction: In clinic only. Use triamcinolone 15 (g) in clinic only. Sween Lotion (Moisturizing lotion) Discharge Instruction: Apply moisturizing lotion as directed Topical Primary Dressing KerraCel Ag Gelling Fiber Dressing, 4x5 in (silver alginate) Discharge Instruction: Apply silver alginate to wound bed as instructed Secondary Dressing Woven Gauze Sponge, Non-Sterile 4x4 in Discharge Instruction: Apply over primary dressing as directed. ABD Pad, 8x10 Discharge Instruction: Apply over primary dressing as directed. Secured With Compression Wrap ThreePress (3 layer compression wrap) Discharge Instruction: Apply three layer compression as directed. Compression Stockings Add-Ons Wound #15 (Lower Leg) Wound Laterality: Right, Posterior Cleanser Soap and Water Discharge Instruction: May shower and wash wound with dial antibacterial soap and water prior to dressing change. Wound Cleanser Discharge Instruction: Cleanse the wound with wound cleanser prior to applying a clean dressing using gauze sponges, not tissue or cotton balls. Peri-Wound Care Triamcinolone 15 (g) Discharge Instruction: In clinic only. Use triamcinolone 15 (g) in clinic  only. Sween Lotion (Moisturizing lotion) Discharge Instruction: Apply moisturizing lotion as directed Topical Primary Dressing KerraCel Ag Gelling Fiber Dressing, 4x5 in (silver alginate) Discharge Instruction: Apply silver alginate to wound bed as instructed Secondary Dressing Woven Gauze Sponge, Non-Sterile 4x4 in Discharge Instruction: Apply over primary dressing as directed. ABD Pad, 8x10 Discharge Instruction: Apply over primary dressing as directed. Secured With Compression Wrap ThreePress (3 layer compression wrap) Discharge Instruction: Apply three layer compression as directed. Compression Stockings Add-Ons Wound #16 (Lower Leg) Wound Laterality: Left, Posterior, Proximal Cleanser Soap and Water Discharge Instruction: May shower and wash wound with dial antibacterial soap and water prior to dressing change. Wound Cleanser Discharge Instruction: Cleanse the wound with wound cleanser prior to applying a clean dressing using gauze sponges, not tissue or cotton balls. Peri-Wound Care Triamcinolone 15 (g) Discharge Instruction: In clinic only. Use triamcinolone 15 (g) in clinic only. Sween Lotion (Moisturizing lotion) Discharge Instruction: Apply moisturizing lotion as directed Topical Primary Dressing KerraCel Ag Gelling Fiber Dressing, 4x5 in (silver alginate) Discharge Instruction: Apply silver alginate to wound bed as instructed Secondary Dressing Woven Gauze Sponge, Non-Sterile 4x4 in Discharge Instruction: Apply over primary dressing as directed. ABD Pad, 8x10 Discharge Instruction: Apply over primary dressing as directed. Secured With Compression Wrap ThreePress (3 layer compression wrap) Discharge Instruction: Apply three layer compression as directed. Compression Stockings Add-Ons Wound #17 (Lower Leg) Wound Laterality: Left, Posterior Cleanser Soap and Water Discharge Instruction: May shower and wash wound with dial antibacterial soap and water prior to  dressing change. Wound Cleanser Discharge Instruction: Cleanse the wound with wound cleanser prior to applying a clean dressing using gauze sponges, not tissue or cotton balls. Peri-Wound Care Triamcinolone 15 (g) Discharge Instruction: In clinic only. Use triamcinolone 15 (g) in clinic only. Sween Lotion (Moisturizing lotion) Discharge Instruction: Apply moisturizing lotion as directed Topical Primary Dressing KerraCel Ag Gelling Fiber Dressing, 4x5 in (silver alginate) Discharge Instruction: Apply silver alginate to wound bed as instructed Secondary Dressing Woven Gauze Sponge, Non-Sterile 4x4 in Discharge Instruction: Apply over primary dressing as directed. ABD Pad, 8x10 Discharge Instruction: Apply over primary dressing as directed. Secured With Compression Wrap ThreePress (3 layer compression wrap) Discharge Instruction: Apply three  layer compression as directed. Compression Stockings Add-Ons Wound #7R (Lower Leg) Wound Laterality: Left, Anterior Cleanser Soap and Water Discharge Instruction: May shower and wash wound with dial antibacterial soap and water prior to dressing change. Wound Cleanser Discharge Instruction: Cleanse the wound with wound cleanser prior to applying a clean dressing using gauze sponges, not tissue or cotton balls. Peri-Wound Care Triamcinolone 15 (g) Discharge Instruction: In clinic only. Use triamcinolone 15 (g) in clinic only. Sween Lotion (Moisturizing lotion) Discharge Instruction: Apply moisturizing lotion as directed Topical Primary Dressing KerraCel Ag Gelling Fiber Dressing, 4x5 in (silver alginate) Discharge Instruction: Apply silver alginate to wound bed as instructed Secondary Dressing Woven Gauze Sponge, Non-Sterile 4x4 in Discharge Instruction: Apply over primary dressing as directed. ABD Pad, 8x10 Discharge Instruction: Apply over primary dressing as directed. Secured With Compression Wrap ThreePress (3 layer compression  wrap) Discharge Instruction: Apply three layer compression as directed. Compression Stockings Add-Ons Wound #8 (Lower Leg) Wound Laterality: Right, Lateral Cleanser Soap and Water Discharge Instruction: May shower and wash wound with dial antibacterial soap and water prior to dressing change. Wound Cleanser Discharge Instruction: Cleanse the wound with wound cleanser prior to applying a clean dressing using gauze sponges, not tissue or cotton balls. Peri-Wound Care Triamcinolone 15 (g) Discharge Instruction: In clinic only. Use triamcinolone 15 (g) in clinic only. Sween Lotion (Moisturizing lotion) Discharge Instruction: Apply moisturizing lotion as directed Topical Primary Dressing KerraCel Ag Gelling Fiber Dressing, 4x5 in (silver alginate) Discharge Instruction: Apply silver alginate to wound bed as instructed Secondary Dressing Woven Gauze Sponge, Non-Sterile 4x4 in Discharge Instruction: Apply over primary dressing as directed. ABD Pad, 8x10 Discharge Instruction: Apply over primary dressing as directed. Secured With Compression Wrap ThreePress (3 layer compression wrap) Discharge Instruction: Apply three layer compression as directed. Compression Stockings Add-Ons Wound #9 (Lower Leg) Wound Laterality: Left, Posterior, Distal Cleanser Soap and Water Discharge Instruction: May shower and wash wound with dial antibacterial soap and water prior to dressing change. Wound Cleanser Discharge Instruction: Cleanse the wound with wound cleanser prior to applying a clean dressing using gauze sponges, not tissue or cotton balls. Peri-Wound Care Triamcinolone 15 (g) Discharge Instruction: In clinic only. Use triamcinolone 15 (g) in clinic only. Sween Lotion (Moisturizing lotion) Discharge Instruction: Apply moisturizing lotion as directed Topical Primary Dressing KerraCel Ag Gelling Fiber Dressing, 4x5 in (silver alginate) Discharge Instruction: Apply silver alginate to wound  bed as instructed Secondary Dressing Woven Gauze Sponge, Non-Sterile 4x4 in Discharge Instruction: Apply over primary dressing as directed. ABD Pad, 8x10 Discharge Instruction: Apply over primary dressing as directed. Secured With Compression Wrap ThreePress (3 layer compression wrap) Discharge Instruction: Apply three layer compression as directed. Compression Stockings Add-Ons Electronic Signature(s) Signed: 11/15/2020 1:21:05 PM By: Kalman Shan DO Signed: 11/16/2020 12:25:45 PM By: Rhae Hammock RN Entered By: Kalman Shan on 11/15/2020 13:05:38 -------------------------------------------------------------------------------- Multi-Disciplinary Care Plan Details Patient Name: Date of Service: [name], Carnisha A. 11/15/2020 11:15 A M Medical Record Number: [id number] Patient Account Number: [id number] Date of Birth/Sex: Treating RN: 1925/07/22 (87 y.o. [name] Primary Care Alyia Lacerte: [name] Other Clinician: Referring Miken Stecher: Treating Deondrea Markos/Extender: [name] in Treatment: 53 Multidisciplinary Care Plan reviewed with physician Active Inactive Venous Leg Ulcer Nursing Diagnoses: Knowledge deficit related to disease process and management Potential for venous Insuffiency (use before diagnosis confirmed) Goals: Patient will maintain optimal edema control Date Initiated: 11/01/2020 Target Resolution Date: 11/29/2020 Goal Status: Active Interventions: Assess peripheral edema status every visit. Compression as ordered Provide  education on venous insufficiency Treatment Activities: Therapeutic compression applied : 11/01/2020 Notes: Wound/Skin Impairment Nursing Diagnoses: Impaired tissue integrity Knowledge deficit related to ulceration/compromised skin integrity Goals: Patient/caregiver will verbalize understanding of skin care regimen Date Initiated: 06/11/2020 Target Resolution Date:  11/29/2020 Goal Status: Active Interventions: Assess patient/caregiver ability to obtain necessary supplies Assess patient/caregiver ability to perform ulcer/skin care regimen upon admission and as needed Assess ulceration(s) every visit Provide education on ulcer and skin care Notes: 10/11/20: Wound care regimen ongoing. Electronic Signature(s) Signed: 11/15/2020 4:35:44 PM By: Lorrin Jackson Entered By: Lorrin Jackson on 11/15/2020 12:11:40 -------------------------------------------------------------------------------- Pain Assessment Details Patient Name: Date of Service: [name], [place]. 11/15/2020 11:15 A M Medical Record Number: [id number] Patient Account Number: [id number] Date of Birth/Sex: Treating RN: 1925/04/14 (87 y.o. [name] Primary Care Elhadji Pecore: [name] Other Clinician: Referring Nasser Ku: Treating Piers Baade/Extender: [name] in Treatment: 22 Active Problems Location of Pain Severity and Description of Pain Patient Has Paino No Site Locations Pain Management and Medication Current Pain Management: Electronic Signature(s) Signed: 11/15/2020 4:35:44 PM By: Lorrin Jackson Entered By: Lorrin Jackson on 11/15/2020 11:57:51 -------------------------------------------------------------------------------- Patient/Caregiver Education Details Patient Name: Date of Service: [name], [name] 10/27/2022andnbsp11:15 A M Medical Record Number: [id number] Patient Account Number: [id number] Date of Birth/Gender: Treating RN: [birth year] (87 y.o. [name] Primary Care Physician: [name] Other Clinician: Referring Physician: Treating Physician/Extender: [name] in Treatment: 22 Education Assessment Education Provided To: Patient Education Topics Provided Venous: Methods: Explain/Verbal, Printed Responses: State content correctly Wound/Skin  Impairment: Methods: Explain/Verbal, Printed Responses: State content correctly Electronic Signature(s) Signed: 11/15/2020 4:35:44 PM By: Lorrin Jackson Entered By: Lorrin Jackson on 11/15/2020 12:12:05 -------------------------------------------------------------------------------- Wound Assessment Details Patient Name: Date of Service: [name], Myrla A. 11/15/2020 11:15 A M Medical Record Number: [id number] Patient Account Number: [id number] Date of Birth/Sex: Treating RN: 09/22/1925 (87 y.o. [name] Primary Care Santasia Rew: [name] Other Clinician: Referring Suhayla Chisom: Treating Lylie Blacklock/Extender: [name] in Treatment: 22 Wound Status Wound Number: 10 Primary Venous Leg Ulcer Etiology: Wound Location: Left, Distal, Medial Lower Leg Wound Open Wounding Event: Gradually Appeared Status: Date Acquired: 10/11/2020 Comorbid Anemia, Arrhythmia, Congestive Heart Failure, Hypertension, Weeks Of Treatment: 5 History: Colitis, Osteoarthritis Clustered Wound: No Wound Measurements Length: (cm) 1.2 Width: (cm) 1.6 Depth: (cm) 0.1 Area: (cm) 1.508 Volume: (cm) 0.151 % Reduction in Area: -220.2% % Reduction in Volume: -221.3% Epithelialization: None Tunneling: No Undermining: No Wound Description Classification: Full Thickness Without Exposed Support Structures Wound Margin: Distinct, outline attached Exudate Amount: Medium Exudate Type: Serosanguineous Exudate Color: red, brown Foul Odor After Cleansing: No Slough/Fibrino No Wound Bed Granulation Amount: Large (67-100%) Exposed Structure Granulation Quality: Red, Pink Fascia Exposed: No Necrotic Amount: None Present (0%) Fat Layer (Subcutaneous Tissue) Exposed: Yes Tendon Exposed: No Muscle Exposed: No Joint Exposed: No Bone Exposed: No Treatment Notes Wound #10 (Lower Leg) Wound Laterality: Left, Medial, Distal Cleanser Soap and Water Discharge Instruction:  May shower and wash wound with dial antibacterial soap and water prior to dressing change. Wound Cleanser Discharge Instruction: Cleanse the wound with wound cleanser prior to applying a clean dressing using gauze sponges, not tissue or cotton balls. Peri-Wound Care Triamcinolone 15 (g) Discharge Instruction: In clinic only. Use triamcinolone 15 (g) in clinic only. Sween Lotion (Moisturizing lotion) Discharge Instruction: Apply moisturizing lotion as directed Topical Primary Dressing KerraCel Ag Gelling Fiber Dressing, 4x5 in (silver alginate) Discharge Instruction: Apply silver alginate  to wound bed as instructed Secondary Dressing Woven Gauze Sponge, Non-Sterile 4x4 in Discharge Instruction: Apply over primary dressing as directed. ABD Pad, 8x10 Discharge Instruction: Apply over primary dressing as directed. Secured With Compression Wrap ThreePress (3 layer compression wrap) Discharge Instruction: Apply three layer compression as directed. Compression Stockings Add-Ons Electronic Signature(s) Signed: 11/15/2020 4:35:44 PM By: Lorrin Jackson Entered By: Lorrin Jackson on 11/15/2020 11:58:52 -------------------------------------------------------------------------------- Wound Assessment Details Patient Name: Date of Service: [name], [place]. 11/15/2020 11:15 A M Medical Record Number: [id number] Patient Account Number: [id number] Date of Birth/Sex: Treating RN: 1926-01-03 (87 y.o. [name] Primary Care Kelleigh Skerritt: [name] Other Clinician: Referring Lelon Ikard: Treating Aamori Mcmasters/Extender: [name] in Treatment: 22 Wound Status Wound Number: 11 Primary Venous Leg Ulcer Etiology: Wound Location: Left, Proximal, Medial Lower Leg Wound Open Wounding Event: Gradually Appeared Status: Date Acquired: 11/01/2020 Comorbid Anemia, Arrhythmia, Congestive Heart Failure, Hypertension, Weeks Of Treatment: 2 History: Colitis,  Osteoarthritis Clustered Wound: Yes Wound Measurements Length: (cm) 1.1 Width: (cm) 0.4 Depth: (cm) 0.1 Area: (cm) 0.346 Volume: (cm) 0.035 % Reduction in Area: 99% % Reduction in Volume: 98.9% Epithelialization: Large (67-100%) Tunneling: No Undermining: No Wound Description Classification: Full Thickness Without Exposed Support Structures Wound Margin: Distinct, outline attached Exudate Amount: Medium Exudate Type: Serous Exudate Color: amber Foul Odor After Cleansing: No Slough/Fibrino No Wound Bed Granulation Amount: Large (67-100%) Exposed Structure Granulation Quality: Red, Pink Fascia Exposed: No Necrotic Amount: None Present (0%) Fat Layer (Subcutaneous Tissue) Exposed: Yes Tendon Exposed: No Muscle Exposed: No Joint Exposed: No Bone Exposed: No Treatment Notes Wound #11 (Lower Leg) Wound Laterality: Left, Medial, Proximal Cleanser Soap and Water Discharge Instruction: May shower and wash wound with dial antibacterial soap and water prior to dressing change. Wound Cleanser Discharge Instruction: Cleanse the wound with wound cleanser prior to applying a clean dressing using gauze sponges, not tissue or cotton balls. Peri-Wound Care Triamcinolone 15 (g) Discharge Instruction: In clinic only. Use triamcinolone 15 (g) in clinic only. Sween Lotion (Moisturizing lotion) Discharge Instruction: Apply moisturizing lotion as directed Topical Primary Dressing KerraCel Ag Gelling Fiber Dressing, 4x5 in (silver alginate) Discharge Instruction: Apply silver alginate to wound bed as instructed Secondary Dressing Woven Gauze Sponge, Non-Sterile 4x4 in Discharge Instruction: Apply over primary dressing as directed. ABD Pad, 8x10 Discharge Instruction: Apply over primary dressing as directed. Secured With Compression Wrap ThreePress (3 layer compression wrap) Discharge Instruction: Apply three layer compression as directed. Compression Stockings Add-Ons Electronic  Signature(s) Signed: 11/15/2020 4:35:44 PM By: Lorrin Jackson Entered By: Lorrin Jackson on 11/15/2020 11:59:20 -------------------------------------------------------------------------------- Wound Assessment Details Patient Name: Date of Service: [name], [place]. 11/15/2020 11:15 A M Medical Record Number: [id number] Patient Account Number: [id number] Date of Birth/Sex: Treating RN: 04-04-1925 (87 y.o. [name] Primary Care Jonah Nestle: [name] Other Clinician: Referring Axtyn Woehler: Treating Shaquinta Peruski/Extender: [name] in Treatment: 22 Wound Status Wound Number: 12 Primary Venous Leg Ulcer Etiology: Wound Location: Left, Lateral Lower Leg Wound Open Wounding Event: Gradually Appeared Status: Date Acquired: 11/01/2020 Comorbid Anemia, Arrhythmia, Congestive Heart Failure, Hypertension, Weeks Of Treatment: 2 History: Colitis, Osteoarthritis Clustered Wound: Yes Wound Measurements Length: (cm) 0.5 Width: (cm) 0.7 Depth: (cm) 0.1 Clustered Quantity: 3 Area: (cm) 0.275 Volume: (cm) 0.027 % Reduction in Area: 95.9% % Reduction in Volume: 96% Epithelialization: Medium (34-66%) Tunneling: No Undermining: No Wound Description Classification: Full Thickness Without Exposed Support Structures Wound Margin: Distinct, outline attached Exudate Amount: Medium Exudate Type: Serous Exudate  Color: amber Foul Odor After Cleansing: No Slough/Fibrino No Wound Bed Granulation Amount: Large (67-100%) Exposed Structure Granulation Quality: Red, Pink Fascia Exposed: No Necrotic Amount: None Present (0%) Fat Layer (Subcutaneous Tissue) Exposed: Yes Tendon Exposed: No Muscle Exposed: No Joint Exposed: No Bone Exposed: No Treatment Notes Wound #12 (Lower Leg) Wound Laterality: Left, Lateral Cleanser Soap and Water Discharge Instruction: May shower and wash wound with dial antibacterial soap and water prior to dressing  change. Wound Cleanser Discharge Instruction: Cleanse the wound with wound cleanser prior to applying a clean dressing using gauze sponges, not tissue or cotton balls. Peri-Wound Care Triamcinolone 15 (g) Discharge Instruction: In clinic only. Use triamcinolone 15 (g) in clinic only. Sween Lotion (Moisturizing lotion) Discharge Instruction: Apply moisturizing lotion as directed Topical Primary Dressing KerraCel Ag Gelling Fiber Dressing, 4x5 in (silver alginate) Discharge Instruction: Apply silver alginate to wound bed as instructed Secondary Dressing Woven Gauze Sponge, Non-Sterile 4x4 in Discharge Instruction: Apply over primary dressing as directed. ABD Pad, 8x10 Discharge Instruction: Apply over primary dressing as directed. Secured With Compression Wrap ThreePress (3 layer compression wrap) Discharge Instruction: Apply three layer compression as directed. Compression Stockings Add-Ons Electronic Signature(s) Signed: 11/15/2020 4:35:44 PM By: Lorrin Jackson Entered By: Lorrin Jackson on 11/15/2020 11:59:51 -------------------------------------------------------------------------------- Wound Assessment Details Patient Name: Date of Service: [name], [place]. 11/15/2020 11:15 A M Medical Record Number: [id number] Patient Account Number: [id number] Date of Birth/Sex: Treating RN: 1925/11/14 (87 y.o. [name] Primary Care Tyjah Hai: [name] Other Clinician: Referring Elba Dendinger: Treating Toleen Lachapelle/Extender: [name] in Treatment: 22 Wound Status Wound Number: 13 Primary Venous Leg Ulcer Etiology: Wound Location: Right, Proximal, Lateral Lower Leg Wound Open Wounding Event: Gradually Appeared Status: Date Acquired: 11/01/2020 Comorbid Anemia, Arrhythmia, Congestive Heart Failure, Hypertension, Weeks Of Treatment: 2 History: Colitis, Osteoarthritis Clustered Wound: Yes Wound Measurements Length: (cm) 0.3 Width:  (cm) 0.3 Depth: (cm) 0.1 Clustered Quantity: 3 Area: (cm) 0.071 Volume: (cm) 0.007 % Reduction in Area: 98.9% % Reduction in Volume: 99% Epithelialization: Large (67-100%) Tunneling: No Undermining: No Wound Description Classification: Full Thickness Without Exposed Support Structures Wound Margin: Distinct, outline attached Exudate Amount: Medium Exudate Type: Serous Exudate Color: amber Foul Odor After Cleansing: No Slough/Fibrino No Wound Bed Granulation Amount: Large (67-100%) Exposed Structure Granulation Quality: Red Fascia Exposed: No Necrotic Amount: None Present (0%) Fat Layer (Subcutaneous Tissue) Exposed: Yes Tendon Exposed: No Muscle Exposed: No Joint Exposed: No Bone Exposed: No Treatment Notes Wound #13 (Lower Leg) Wound Laterality: Right, Lateral, Proximal Cleanser Soap and Water Discharge Instruction: May shower and wash wound with dial antibacterial soap and water prior to dressing change. Wound Cleanser Discharge Instruction: Cleanse the wound with wound cleanser prior to applying a clean dressing using gauze sponges, not tissue or cotton balls. Peri-Wound Care Triamcinolone 15 (g) Discharge Instruction: In clinic only. Use triamcinolone 15 (g) in clinic only. Sween Lotion (Moisturizing lotion) Discharge Instruction: Apply moisturizing lotion as directed Topical Primary Dressing KerraCel Ag Gelling Fiber Dressing, 4x5 in (silver alginate) Discharge Instruction: Apply silver alginate to wound bed as instructed Secondary Dressing Woven Gauze Sponge, Non-Sterile 4x4 in Discharge Instruction: Apply over primary dressing as directed. ABD Pad, 8x10 Discharge Instruction: Apply over primary dressing as directed. Secured With Compression Wrap ThreePress (3 layer compression wrap) Discharge Instruction: Apply three layer compression as directed. Compression Stockings Add-Ons Electronic Signature(s) Signed: 11/15/2020 4:35:44 PM By: Lorrin Jackson Entered By: Lorrin Jackson on 11/15/2020 12:02:05 -------------------------------------------------------------------------------- Wound Assessment Details Patient Name: Date of  Service: [name], Tanielle A. 11/15/2020 11:15 A M Medical Record Number: [id number] Patient Account Number: [id number] Date of Birth/Sex: Treating RN: 1925-03-14 (87 y.o. [name] Primary Care Mykael Batz: [name] Other Clinician: Referring Jenascia Bumpass: Treating Lowen Barringer/Extender: [name] in Treatment: 22 Wound Status Wound Number: 14 Primary Venous Leg Ulcer Etiology: Wound Location: Right, Medial Lower Leg Wound Healed - Epithelialized Wounding Event: Gradually Appeared Status: Date Acquired: 11/01/2020 Comorbid Anemia, Arrhythmia, Congestive Heart Failure, Hypertension, Weeks Of Treatment: 2 History: Colitis, Osteoarthritis Clustered Wound: Yes Photos Wound Measurements Length: (cm) Width: (cm) Depth: (cm) Area: (cm) Volume: (cm) 0 % Reduction in Area: 100% 0 % Reduction in Volume: 100% 0 0 0 Wound Description Classification: Full Thickness Without Exposed Support Structur es Electronic Signature(s) Signed: 11/15/2020 4:35:44 PM By: Lorrin Jackson Entered By: Lorrin Jackson on 11/15/2020 12:08:03 -------------------------------------------------------------------------------- Wound Assessment Details Patient Name: Date of Service: [name], Tericka A. 11/15/2020 11:15 A M Medical Record Number: [id number] Patient Account Number: [id number] Date of Birth/Sex: Treating RN: Jul 08, 1925 (87 y.o. [name] Primary Care Crecencio Kwiatek: [name] Other Clinician: Referring Basil Blakesley: Treating Copeland Lapier/Extender: [name] in Treatment: 22 Wound Status Wound Number: 15 Primary Venous Leg Ulcer Etiology: Wound Location: Right, Posterior Lower Leg Wound Open Wounding Event: Gradually  Appeared Status: Date Acquired: 11/08/2020 Comorbid Anemia, Arrhythmia, Congestive Heart Failure, Hypertension, Weeks Of Treatment: 1 History: Colitis, Osteoarthritis Clustered Wound: Yes Wound Measurements Length: (cm) 2 Width: (cm) 2.5 Depth: (cm) 0.1 Area: (cm) 3.927 Volume: (cm) 0.393 % Reduction in Area: 0% % Reduction in Volume: 0% Epithelialization: None Tunneling: No Undermining: No Wound Description Classification: Full Thickness Without Exposed Support Structures Wound Margin: Distinct, outline attached Exudate Amount: Medium Exudate Type: Serosanguineous Exudate Color: red, brown Foul Odor After Cleansing: No Slough/Fibrino No Wound Bed Granulation Amount: Large (67-100%) Exposed Structure Granulation Quality: Red Fascia Exposed: No Necrotic Amount: None Present (0%) Fat Layer (Subcutaneous Tissue) Exposed: Yes Tendon Exposed: No Muscle Exposed: No Joint Exposed: No Bone Exposed: No Treatment Notes Wound #15 (Lower Leg) Wound Laterality: Right, Posterior Cleanser Soap and Water Discharge Instruction: May shower and wash wound with dial antibacterial soap and water prior to dressing change. Wound Cleanser Discharge Instruction: Cleanse the wound with wound cleanser prior to applying a clean dressing using gauze sponges, not tissue or cotton balls. Peri-Wound Care Triamcinolone 15 (g) Discharge Instruction: In clinic only. Use triamcinolone 15 (g) in clinic only. Sween Lotion (Moisturizing lotion) Discharge Instruction: Apply moisturizing lotion as directed Topical Primary Dressing KerraCel Ag Gelling Fiber Dressing, 4x5 in (silver alginate) Discharge Instruction: Apply silver alginate to wound bed as instructed Secondary Dressing Woven Gauze Sponge, Non-Sterile 4x4 in Discharge Instruction: Apply over primary dressing as directed. ABD Pad, 8x10 Discharge Instruction: Apply over primary dressing as directed. Secured With Compression  Wrap ThreePress (3 layer compression wrap) Discharge Instruction: Apply three layer compression as directed. Compression Stockings Add-Ons Electronic Signature(s) Signed: 11/15/2020 4:35:44 PM By: Lorrin Jackson Entered By: Lorrin Jackson on 11/15/2020 12:03:07 -------------------------------------------------------------------------------- Wound Assessment Details Patient Name: Date of Service: [name], [place]. 11/15/2020 11:15 A M Medical Record Number: [id number] Patient Account Number: [id number] Date of Birth/Sex: Treating RN: 04-04-1925 (87 y.o. [name] Primary Care Deairra Halleck: [name] Other Clinician: Referring Shalah Estelle: Treating Esmeralda Blanford/Extender: [name] in Treatment: 22 Wound Status Wound Number: 16 Primary Venous Leg Ulcer Etiology: Wound Location: Left, Proximal, Posterior Lower Leg Wound Open Wounding Event: Gradually Appeared Status:  Date Acquired: 11/08/2020 Comorbid Anemia, Arrhythmia, Congestive Heart Failure, Hypertension, Weeks Of Treatment: 1 History: Colitis, Osteoarthritis Clustered Wound: No Wound Measurements Length: (cm) 1 Width: (cm) 0.8 Depth: (cm) 0.1 Area: (cm) 0.628 Volume: (cm) 0.063 % Reduction in Area: 0% % Reduction in Volume: 0% Epithelialization: None Tunneling: No Undermining: No Wound Description Classification: Full Thickness Without Exposed Support Structures Wound Margin: Distinct, outline attached Exudate Amount: Medium Exudate Type: Serosanguineous Exudate Color: red, brown Foul Odor After Cleansing: No Slough/Fibrino No Wound Bed Granulation Amount: Large (67-100%) Exposed Structure Granulation Quality: Red Fascia Exposed: No Necrotic Amount: None Present (0%) Fat Layer (Subcutaneous Tissue) Exposed: Yes Tendon Exposed: No Muscle Exposed: No Joint Exposed: No Bone Exposed: No Treatment Notes Wound #16 (Lower Leg) Wound Laterality: Left, Posterior,  Proximal Cleanser Soap and Water Discharge Instruction: May shower and wash wound with dial antibacterial soap and water prior to dressing change. Wound Cleanser Discharge Instruction: Cleanse the wound with wound cleanser prior to applying a clean dressing using gauze sponges, not tissue or cotton balls. Peri-Wound Care Triamcinolone 15 (g) Discharge Instruction: In clinic only. Use triamcinolone 15 (g) in clinic only. Sween Lotion (Moisturizing lotion) Discharge Instruction: Apply moisturizing lotion as directed Topical Primary Dressing KerraCel Ag Gelling Fiber Dressing, 4x5 in (silver alginate) Discharge Instruction: Apply silver alginate to wound bed as instructed Secondary Dressing Woven Gauze Sponge, Non-Sterile 4x4 in Discharge Instruction: Apply over primary dressing as directed. ABD Pad, 8x10 Discharge Instruction: Apply over primary dressing as directed. Secured With Compression Wrap ThreePress (3 layer compression wrap) Discharge Instruction: Apply three layer compression as directed. Compression Stockings Add-Ons Electronic Signature(s) Signed: 11/15/2020 4:35:44 PM By: Lorrin Jackson Entered By: Lorrin Jackson on 11/15/2020 12:03:33 -------------------------------------------------------------------------------- Wound Assessment Details Patient Name: Date of Service: [name], [place]. 11/15/2020 11:15 A M Medical Record Number: [id number] Patient Account Number: [id number] Date of Birth/Sex: Treating RN: 10-11-1925 (87 y.o. [name] Primary Care Deannie Resetar: [name] Other Clinician: Referring Alan Riles: Treating Keyon Liller/Extender: [name] in Treatment: 22 Wound Status Wound Number: 17 Primary Venous Leg Ulcer Etiology: Wound Location: Left, Posterior Lower Leg Wound Open Wounding Event: Gradually Appeared Status: Date Acquired: 11/08/2020 Comorbid Anemia, Arrhythmia, Congestive Heart Failure,  Hypertension, Weeks Of Treatment: 1 History: Colitis, Osteoarthritis Clustered Wound: No Wound Measurements Length: (cm) 1.5 Width: (cm) 1.2 Depth: (cm) 0.1 Area: (cm) 1.414 Volume: (cm) 0.141 % Reduction in Area: 0% % Reduction in Volume: 0% Epithelialization: None Tunneling: No Undermining: No Wound Description Classification: Full Thickness Without Exposed Support Structures Wound Margin: Distinct, outline attached Exudate Amount: Medium Exudate Type: Serosanguineous Exudate Color: red, brown Foul Odor After Cleansing: No Slough/Fibrino No Wound Bed Granulation Amount: Large (67-100%) Exposed Structure Granulation Quality: Red, Pink Fascia Exposed: No Necrotic Amount: None Present (0%) Fat Layer (Subcutaneous Tissue) Exposed: Yes Tendon Exposed: No Muscle Exposed: No Joint Exposed: No Bone Exposed: No Treatment Notes Wound #17 (Lower Leg) Wound Laterality: Left, Posterior Cleanser Soap and Water Discharge Instruction: May shower and wash wound with dial antibacterial soap and water prior to dressing change. Wound Cleanser Discharge Instruction: Cleanse the wound with wound cleanser prior to applying a clean dressing using gauze sponges, not tissue or cotton balls. Peri-Wound Care Triamcinolone 15 (g) Discharge Instruction: In clinic only. Use triamcinolone 15 (g) in clinic only. Sween Lotion (Moisturizing lotion) Discharge Instruction: Apply moisturizing lotion as directed Topical Primary Dressing KerraCel Ag Gelling Fiber Dressing, 4x5 in (silver alginate) Discharge Instruction: Apply silver alginate to wound bed as instructed Secondary  Dressing Woven Gauze Sponge, Non-Sterile 4x4 in Discharge Instruction: Apply over primary dressing as directed. ABD Pad, 8x10 Discharge Instruction: Apply over primary dressing as directed. Secured With Compression Wrap ThreePress (3 layer compression wrap) Discharge Instruction: Apply three layer compression as  directed. Compression Stockings Add-Ons Electronic Signature(s) Signed: 11/15/2020 4:35:44 PM By: Lorrin Jackson Entered By: Lorrin Jackson on 11/15/2020 12:05:32 -------------------------------------------------------------------------------- Wound Assessment Details Patient Name: Date of Service: [name], [place]. 11/15/2020 11:15 A M Medical Record Number: [id number] Patient Account Number: [id number] Date of Birth/Sex: Treating RN: 03/04/1925 (87 y.o. [name] Primary Care Khalifa Knecht: [name] Other Clinician: Referring Brandy Kabat: Treating Farron Watrous/Extender: [name] in Treatment: 22 Wound Status Wound Number: [id number] Primary Malignant Wound Etiology: Wound Location: Left, Anterior Lower Leg Wound Open Wounding Event: Blister Status: Date Acquired: 04/20/2020 Comorbid Anemia, Arrhythmia, Congestive Heart Failure, Hypertension, Weeks Of Treatment: 22 History: Colitis, Osteoarthritis Clustered Wound: No Wound Measurements Length: (cm) 0.4 Width: (cm) 0.9 Depth: (cm) 0.1 Area: (cm) 0.283 Volume: (cm) 0.028 % Reduction in Area: 90.8% % Reduction in Volume: 90.9% Epithelialization: Medium (34-66%) Tunneling: No Undermining: No Wound Description Classification: Full Thickness Without Exposed Support Structures Wound Margin: Distinct, outline attached Exudate Amount: Medium Exudate Type: Serosanguineous Exudate Color: red, brown Foul Odor After Cleansing: No Slough/Fibrino No Wound Bed Granulation Amount: Large (67-100%) Exposed Structure Granulation Quality: Red, Pink Fascia Exposed: No Necrotic Amount: None Present (0%) Fat Layer (Subcutaneous Tissue) Exposed: Yes Tendon Exposed: No Muscle Exposed: No Joint Exposed: No Bone Exposed: No Treatment Notes Wound #7R (Lower Leg) Wound Laterality: Left, Anterior Cleanser Soap and Water Discharge Instruction: May shower and wash wound with dial antibacterial soap  and water prior to dressing change. Wound Cleanser Discharge Instruction: Cleanse the wound with wound cleanser prior to applying a clean dressing using gauze sponges, not tissue or cotton balls. Peri-Wound Care Triamcinolone 15 (g) Discharge Instruction: In clinic only. Use triamcinolone 15 (g) in clinic only. Sween Lotion (Moisturizing lotion) Discharge Instruction: Apply moisturizing lotion as directed Topical Primary Dressing KerraCel Ag Gelling Fiber Dressing, 4x5 in (silver alginate) Discharge Instruction: Apply silver alginate to wound bed as instructed Secondary Dressing Woven Gauze Sponge, Non-Sterile 4x4 in Discharge Instruction: Apply over primary dressing as directed. ABD Pad, 8x10 Discharge Instruction: Apply over primary dressing as directed. Secured With Compression Wrap ThreePress (3 layer compression wrap) Discharge Instruction: Apply three layer compression as directed. Compression Stockings Add-Ons Electronic Signature(s) Signed: 11/15/2020 4:35:44 PM By: Lorrin Jackson Entered By: Lorrin Jackson on 11/15/2020 12:03:58 -------------------------------------------------------------------------------- Wound Assessment Details Patient Name: Date of Service: [name], [place]. 11/15/2020 11:15 A M Medical Record Number: [id number] Patient Account Number: [id number] Date of Birth/Sex: Treating RN: Jun 01, 1925 (87 y.o. [name] Primary Care Obrian Bulson: [name] Other Clinician: Referring Evart Mcdonnell: Treating Savvy Peeters/Extender: [name] in Treatment: 22 Wound Status Wound Number: 8 Primary Venous Leg Ulcer Etiology: Wound Location: Right, Lateral Lower Leg Wound Open Wounding Event: Gradually Appeared Status: Date Acquired: 09/27/2020 Comorbid Anemia, Arrhythmia, Congestive Heart Failure, Hypertension, Weeks Of Treatment: 7 History: Colitis, Osteoarthritis Clustered Wound: No Wound Measurements Length: (cm)  0.6 Width: (cm) 1.1 Depth: (cm) 0.1 Area: (cm) 0.518 Volume: (cm) 0.052 % Reduction in Area: -10% % Reduction in Volume: -10.6% Epithelialization: Medium (34-66%) Tunneling: No Undermining: No Wound Description Classification: Full Thickness Without Exposed Support Structures Wound Margin: Distinct, outline attached Exudate Amount: Medium Exudate Type: Serous Exudate Color: amber Foul Odor After Cleansing: No Slough/Fibrino No Wound Bed  Granulation Amount: Large (67-100%) Exposed Structure Granulation Quality: Red, Pink Fascia Exposed: No Necrotic Amount: None Present (0%) Fat Layer (Subcutaneous Tissue) Exposed: Yes Tendon Exposed: No Muscle Exposed: No Joint Exposed: No Bone Exposed: No Treatment Notes Wound #8 (Lower Leg) Wound Laterality: Right, Lateral Cleanser Soap and Water Discharge Instruction: May shower and wash wound with dial antibacterial soap and water prior to dressing change. Wound Cleanser Discharge Instruction: Cleanse the wound with wound cleanser prior to applying a clean dressing using gauze sponges, not tissue or cotton balls. Peri-Wound Care Triamcinolone 15 (g) Discharge Instruction: In clinic only. Use triamcinolone 15 (g) in clinic only. Sween Lotion (Moisturizing lotion) Discharge Instruction: Apply moisturizing lotion as directed Topical Primary Dressing KerraCel Ag Gelling Fiber Dressing, 4x5 in (silver alginate) Discharge Instruction: Apply silver alginate to wound bed as instructed Secondary Dressing Woven Gauze Sponge, Non-Sterile 4x4 in Discharge Instruction: Apply over primary dressing as directed. ABD Pad, 8x10 Discharge Instruction: Apply over primary dressing as directed. Secured With Compression Wrap ThreePress (3 layer compression wrap) Discharge Instruction: Apply three layer compression as directed. Compression Stockings Add-Ons Electronic Signature(s) Signed: 11/15/2020 4:35:44 PM By: Lorrin Jackson Entered By:  Lorrin Jackson on 11/15/2020 12:04:26 -------------------------------------------------------------------------------- Wound Assessment Details Patient Name: Date of Service: [name], [place]. 11/15/2020 11:15 A M Medical Record Number: [id number] Patient Account Number: [id number] Date of Birth/Sex: Treating RN: 1925/05/08 (87 y.o. [name] Primary Care Dwain Huhn: [name] Other Clinician: Referring Zylpha Poynor: Treating Fate Gal[place]/Extender: [name] in Treatment: 22 Wound Status Wound Number: 9 Primary Venous Leg Ulcer Etiology: Wound Location: Left, Distal, Posterior Lower Leg Wound Open Wounding Event: Gradually Appeared Status: Date Acquired: 10/04/2020 Comorbid Anemia, Arrhythmia, Congestive Heart Failure, Hypertension, Weeks Of Treatment: 6 History: Colitis, Osteoarthritis Clustered Wound: No Photos Wound Measurements Length: (cm) 4.3 Width: (cm) 4 Depth: (cm) 0.1 Area: (cm) 13.509 Volume: (cm) 1.351 % Reduction in Area: -7085.6% % Reduction in Volume: -7010.5% Epithelialization: Medium (34-66%) Tunneling: No Undermining: No Wound Description Classification: Full Thickness Without Exposed Support Structures Wound Margin: Distinct, outline attached Exudate Amount: Medium Exudate Type: Serosanguineous Exudate Color: red, brown Foul Odor After Cleansing: No Slough/Fibrino Yes Wound Bed Granulation Amount: Large (67-100%) Exposed Structure Granulation Quality: Red, Pink Fascia Exposed: No Necrotic Amount: Small (1-33%) Fat Layer (Subcutaneous Tissue) Exposed: Yes Necrotic Quality: Adherent Slough Tendon Exposed: No Muscle Exposed: No Joint Exposed: No Bone Exposed: No Treatment Notes Wound #9 (Lower Leg) Wound Laterality: Left, Posterior, Distal Cleanser Soap and Water Discharge Instruction: May shower and wash wound with dial antibacterial soap and water prior to dressing change. Wound  Cleanser Discharge Instruction: Cleanse the wound with wound cleanser prior to applying a clean dressing using gauze sponges, not tissue or cotton balls. Peri-Wound Care Triamcinolone 15 (g) Discharge Instruction: In clinic only. Use triamcinolone 15 (g) in clinic only. Sween Lotion (Moisturizing lotion) Discharge Instruction: Apply moisturizing lotion as directed Topical Primary Dressing KerraCel Ag Gelling Fiber Dressing, 4x5 in (silver alginate) Discharge Instruction: Apply silver alginate to wound bed as instructed Secondary Dressing Woven Gauze Sponge, Non-Sterile 4x4 in Discharge Instruction: Apply over primary dressing as directed. ABD Pad, 8x10 Discharge Instruction: Apply over primary dressing as directed. Secured With Compression Wrap ThreePress (3 layer compression wrap) Discharge Instruction: Apply three layer compression as directed. Compression Stockings Add-Ons Electronic Signature(s) Signed: 11/15/2020 4:35:44 PM By: Lorrin Jackson Entered By: Lorrin Jackson on 11/15/2020 12:08:39 -------------------------------------------------------------------------------- Vitals Details Patient Name: Date of Service: [name], [place]. 11/15/2020 11:15 A M Medical  Record Number: [id number] Patient Account Number: [id number] Date of Birth/Sex: Treating RN: 16-Apr-1925 (87 y.o. [name] Primary Care Arabel Barcenas: [name] Other Clinician: Referring [place]: Treating Cezar Misiaszek/Extender: [name] in Treatment: 22 Vital Signs Time Taken: 11:43 Temperature (F): 97.5 Height (in): 65 Pulse (bpm): 63 Weight (lbs): 163 Respiratory Rate (breaths/min): 18 Body Mass Index (BMI): 27.1 Blood Pressure (mmHg): 125/63 Reference Range: 80 - 120 mg / dl Electronic Signature(s) Signed: 11/15/2020 4:35:44 PM By: Lorrin Jackson Entered By: Lorrin Jackson on 11/15/2020 11:44:15

## 2020-11-16 NOTE — Progress Notes (Signed)
[name] ([id number]) , Visit Report for 11/15/2020 Chief Complaint Document Details Patient Name: Date of Service: [name], [place] 11/15/2020 11:15 A M Medical Record Number: [id number] Patient Account Number: [id number] Date of Birth/Sex: Treating RN: 13-Apr-1925 (87 y.o. [name], [name] Primary Care Provider: [name] Other Clinician: Referring Provider: Treating Provider/Extender: [name] in Treatment: 22 Information Obtained from: Patient Chief Complaint Bilateral lower extremity wounds that have been biopsied and positive for squamous cell carcinoma Electronic Signature(s) Signed: 11/15/2020 1:21:05 PM By: Kalman Shan DO Entered By: Kalman Shan on 11/15/2020 13:05:50 -------------------------------------------------------------------------------- HPI Details Patient Name: Date of Service: [name], [name] 11/15/2020 11:15 A M Medical Record Number: [id number] Patient Account Number: [id number] Date of Birth/Sex: Treating RN: 04-14-1925 (87 y.o. [name] Primary Care Provider: [name] Other Clinician: Referring Provider: Treating Provider/Extender: [name] in Treatment: 22 History of Present Illness Location: left leg HPI Description: Admission 5/23 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 She states that the wound on the right has been present for a year. The 1 on the left has opened up 1 month ago. She is followed with oncology for this issue as she had biopsies that showed squamous cell carcinoma. She is also seeing radiation oncology for treatment options. She presents today because she would like for her wounds  to be healed by [place]. She currently denies signs of infection. 6/1; patient presents for 1 week follow-up. She states she has tolerated the leg wraps well. She states these do not bother her and is happy to continue with them. She is scheduled to see her oncologist today to go over treatment options for the bilateral lower extremity squamous cell carcinoma. Radiation is currently not a recommended option. Patient states she overall feels well. 6/22; patient presents for 3-week follow-up. She has tolerated the wraps well until her last wrap where she states they were uncomfortable. She attributes this to the home health nurse. She denies signs of infection. She has started her first treatment of antibody infusions for her Bilateral lower extremity squamous cell carcinoma. She has no complaints today. 7/21; patient presents for 1 month follow-up. Unfortunately she has not had good experience with her wrap changes with home health. She would like to do her own dressing changes. She continues to do her antibody infusions. She denies signs of infection. 7/28; patient presents for 1 week follow-up. At last clinic visit she was switched to daily dressing changes due to issues with the wrap and home health placing them. Unfortunately she has developed weeping to her legs bilaterally. She would like to be placed in wraps today. She would also like to follow with [place] weekly for wrap changes instead of having home health change them. She denies signs of infection. 8/4; patient presents for 1 week follow-up. She has tolerated the Kerlix/Coban wraps well. She no longer has weeping to her legs. She took the wrap off 1 day before coming in to be able to take a shower. She has no issues or complaints today. She denies signs of infection. 8/18; patient presents for follow-up. Patient has tolerated the wraps well. She brought her Velcro compression wraps today. She has no issues or complaints today. She had her  chemotherapy infusion yesterday without issues. She denies signs of infection. 8/25; patient presents for follow-up. She used her juxta lite compressions for the past week. It is unclear if she is able to put these on correctly since she states she has a hard time getting them to look right. She reports 2 open wounds. She currently denies signs of infection. 9/1; patient presents for 1 week follow-up. She has 1 open wound. She tolerated the compression wraps well. She currently denies signs of infection. 9/8; patient presents for 1 week follow-up. She has 2 open wounds 1 on each leg. She has tolerated the compression wrap well. She currently denies signs of infection. 9/15; patient presents for 1 week follow-up. She now has 3 wounds. 2 on the left and 1 on the right. She continues to tolerate the compression wrap well. She currently denies signs of infection. She has obtained furosemide by her primary care physician and would like to discuss when to take this. 9/22; patient presents for 1 week follow-up. She has scattered wounds on her lower extremities bilaterally. She did take furosemide twice in the past week. She does not recall having to urinate more frequently. She tolerated the 3 layer compression well. She denies signs of infection. 10/13; patient has not been here recently because of [place]. Apparently the facility was only putting gauze on her legs. This is a patient I do not normally see. 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 Ronnald Ramp at [facility] dermatology. She is quite convincing that she did not have radiation to her lower legs. It is likely she also has significant chronic venous insufficiency stasis dermatitis. We have been using silver alginate under kerlix Coban. She has obvious open areas medially on the right and areas on the left. She also has areas of extensive dry flaking adherent areas on the right  and to a lesser extent on the left anterior. Nodular areas on the left lateral lower leg left posterior calf and right mid calf medially. 10/21; patient presents for follow-up. She has no issues or complaints today. She has tolerated the 3 layer compression well bilaterally. She denies signs of infection. 10/27; patient presents for follow-up. She continues to tolerate the 3 layer compression wrap well. She denies signs of infection. Electronic Signature(s) Signed: 11/15/2020 1:21:05 PM By: Kalman Shan DO Entered By: Kalman Shan on 11/15/2020 13:15:34 -------------------------------------------------------------------------------- Physical Exam Details Patient Name: Date of Service: [name], [place]. 11/15/2020 11:15 A M Medical Record Number: [id number] Patient Account Number: [id number] Date of Birth/Sex: Treating RN: 11-21-1925 (87 y.o. [name], [name] Primary Care Provider: [name] Other Clinician: Referring Provider: Treating Provider/Extender: [name] in Treatment: 22 Constitutional respirations regular, non-labored and within target range for patient.. Cardiovascular 2+ dorsalis pedis/posterior tibialis pulses. Psychiatric pleasant and cooperative. Notes Very small scattered open wounds to her legs bilaterally limited to skin breakdown. The largest wound is to the posterior left lower extremity with granulation tissue present. No signs of infection. Electronic Signature(s) Signed: 11/15/2020 1:21:05 PM By: Kalman Shan DO Entered By: Kalman Shan on 11/15/2020 13:17:22 -------------------------------------------------------------------------------- Physician Orders Details Patient Name: Date of Service: [name], [place]. 11/15/2020 11:15 A M Medical Record Number: [id number] Patient Account Number: [id number] Date of Birth/Sex: Treating RN: 1925/10/27 (87 y.o. [name] Primary Care Provider:  [name] Other Clinician: Referring Provider: Treating Provider/Extender: Kalman Shan [name], Vivi Ferns  in Treatment: 22 Verbal / Phone Orders: No Diagnosis Coding Follow-up Appointments ppointment in 1 week. - with Dr. Heber Greenfield Return A Bathing/ Shower/ Hygiene May shower with protection but do not get wound dressing(s) wet. - Use cast protector Edema Control - Lymphedema / SCD / Other Elevate legs to the level of the heart or above for 30 minutes daily and/or when sitting, a frequency of: - elevate the legs throughout the day heart level if possible. Avoid standing for long periods of time. Exercise regularly Additional Orders / Instructions Follow Nutritious Diet Wound Treatment Wound #10 - Lower Leg Wound Laterality: Left, Medial, Distal Cleanser: Soap and Water 1 x Per Week/15 Days Discharge Instructions: May shower and wash wound with dial antibacterial soap and water prior to dressing change. Cleanser: Wound Cleanser 1 x Per Week/15 Days Discharge Instructions: Cleanse the wound with wound cleanser prior to applying a clean dressing using gauze sponges, not tissue or cotton balls. Peri-Wound Care: Triamcinolone 15 (g) 1 x Per Week/15 Days Discharge Instructions: In clinic only. Use triamcinolone 15 (g) in clinic only. Peri-Wound Care: Sween Lotion (Moisturizing lotion) 1 x Per Week/15 Days Discharge Instructions: Apply moisturizing lotion as directed Prim Dressing: KerraCel Ag Gelling Fiber Dressing, 4x5 in (silver alginate) 1 x Per Week/15 Days ary Discharge Instructions: Apply silver alginate to wound bed as instructed Secondary Dressing: Woven Gauze Sponge, Non-Sterile 4x4 in 1 x Per Week/15 Days Discharge Instructions: Apply over primary dressing as directed. Secondary Dressing: ABD Pad, 8x10 1 x Per Week/15 Days Discharge Instructions: Apply over primary dressing as directed. Compression Wrap: ThreePress (3 layer compression wrap) 1 x Per  Week/15 Days Discharge Instructions: Apply three layer compression as directed. Wound #11 - Lower Leg Wound Laterality: Left, Medial, Proximal Cleanser: Soap and Water 1 x Per Week/15 Days Discharge Instructions: May shower and wash wound with dial antibacterial soap and water prior to dressing change. Cleanser: Wound Cleanser 1 x Per Week/15 Days Discharge Instructions: Cleanse the wound with wound cleanser prior to applying a clean dressing using gauze sponges, not tissue or cotton balls. Peri-Wound Care: Triamcinolone 15 (g) 1 x Per Week/15 Days Discharge Instructions: In clinic only. Use triamcinolone 15 (g) in clinic only. Peri-Wound Care: Sween Lotion (Moisturizing lotion) 1 x Per Week/15 Days Discharge Instructions: Apply moisturizing lotion as directed Prim Dressing: KerraCel Ag Gelling Fiber Dressing, 4x5 in (silver alginate) 1 x Per Week/15 Days ary Discharge Instructions: Apply silver alginate to wound bed as instructed Secondary Dressing: Woven Gauze Sponge, Non-Sterile 4x4 in 1 x Per Week/15 Days Discharge Instructions: Apply over primary dressing as directed. Secondary Dressing: ABD Pad, 8x10 1 x Per Week/15 Days Discharge Instructions: Apply over primary dressing as directed. Compression Wrap: ThreePress (3 layer compression wrap) 1 x Per Week/15 Days Discharge Instructions: Apply three layer compression as directed. Wound #12 - Lower Leg Wound Laterality: Left, Lateral Cleanser: Soap and Water 1 x Per Week/15 Days Discharge Instructions: May shower and wash wound with dial antibacterial soap and water prior to dressing change. Cleanser: Wound Cleanser 1 x Per Week/15 Days Discharge Instructions: Cleanse the wound with wound cleanser prior to applying a clean dressing using gauze sponges, not tissue or cotton balls. Peri-Wound Care: Triamcinolone 15 (g) 1 x Per Week/15 Days Discharge Instructions: In clinic only. Use triamcinolone 15 (g) in clinic only. Peri-Wound Care:  Sween Lotion (Moisturizing lotion) 1 x Per Week/15 Days Discharge Instructions: Apply moisturizing lotion as directed Prim Dressing: KerraCel Ag Gelling Fiber Dressing, 4x5 in (silver alginate) 1  x Per Week/15 Days ary Discharge Instructions: Apply silver alginate to wound bed as instructed Secondary Dressing: Woven Gauze Sponge, Non-Sterile 4x4 in 1 x Per Week/15 Days Discharge Instructions: Apply over primary dressing as directed. Secondary Dressing: ABD Pad, 8x10 1 x Per Week/15 Days Discharge Instructions: Apply over primary dressing as directed. Compression Wrap: ThreePress (3 layer compression wrap) 1 x Per Week/15 Days Discharge Instructions: Apply three layer compression as directed. Wound #13 - Lower Leg Wound Laterality: Right, Lateral, Proximal Cleanser: Soap and Water 1 x Per Week/15 Days Discharge Instructions: May shower and wash wound with dial antibacterial soap and water prior to dressing change. Cleanser: Wound Cleanser 1 x Per Week/15 Days Discharge Instructions: Cleanse the wound with wound cleanser prior to applying a clean dressing using gauze sponges, not tissue or cotton balls. Peri-Wound Care: Triamcinolone 15 (g) 1 x Per Week/15 Days Discharge Instructions: In clinic only. Use triamcinolone 15 (g) in clinic only. Peri-Wound Care: Sween Lotion (Moisturizing lotion) 1 x Per Week/15 Days Discharge Instructions: Apply moisturizing lotion as directed Prim Dressing: KerraCel Ag Gelling Fiber Dressing, 4x5 in (silver alginate) 1 x Per Week/15 Days ary Discharge Instructions: Apply silver alginate to wound bed as instructed Secondary Dressing: Woven Gauze Sponge, Non-Sterile 4x4 in 1 x Per Week/15 Days Discharge Instructions: Apply over primary dressing as directed. Secondary Dressing: ABD Pad, 8x10 1 x Per Week/15 Days Discharge Instructions: Apply over primary dressing as directed. Compression Wrap: ThreePress (3 layer compression wrap) 1 x Per Week/15 Days Discharge  Instructions: Apply three layer compression as directed. Wound #15 - Lower Leg Wound Laterality: Right, Posterior Cleanser: Soap and Water 1 x Per Week/15 Days Discharge Instructions: May shower and wash wound with dial antibacterial soap and water prior to dressing change. Cleanser: Wound Cleanser 1 x Per Week/15 Days Discharge Instructions: Cleanse the wound with wound cleanser prior to applying a clean dressing using gauze sponges, not tissue or cotton balls. Peri-Wound Care: Triamcinolone 15 (g) 1 x Per Week/15 Days Discharge Instructions: In clinic only. Use triamcinolone 15 (g) in clinic only. Peri-Wound Care: Sween Lotion (Moisturizing lotion) 1 x Per Week/15 Days Discharge Instructions: Apply moisturizing lotion as directed Prim Dressing: KerraCel Ag Gelling Fiber Dressing, 4x5 in (silver alginate) 1 x Per Week/15 Days ary Discharge Instructions: Apply silver alginate to wound bed as instructed Secondary Dressing: Woven Gauze Sponge, Non-Sterile 4x4 in 1 x Per Week/15 Days Discharge Instructions: Apply over primary dressing as directed. Secondary Dressing: ABD Pad, 8x10 1 x Per Week/15 Days Discharge Instructions: Apply over primary dressing as directed. Compression Wrap: ThreePress (3 layer compression wrap) 1 x Per Week/15 Days Discharge Instructions: Apply three layer compression as directed. Wound #16 - Lower Leg Wound Laterality: Left, Posterior, Proximal Cleanser: Soap and Water 1 x Per Week/15 Days Discharge Instructions: May shower and wash wound with dial antibacterial soap and water prior to dressing change. Cleanser: Wound Cleanser 1 x Per Week/15 Days Discharge Instructions: Cleanse the wound with wound cleanser prior to applying a clean dressing using gauze sponges, not tissue or cotton balls. Peri-Wound Care: Triamcinolone 15 (g) 1 x Per Week/15 Days Discharge Instructions: In clinic only. Use triamcinolone 15 (g) in clinic only. Peri-Wound Care: Sween Lotion  (Moisturizing lotion) 1 x Per Week/15 Days Discharge Instructions: Apply moisturizing lotion as directed Prim Dressing: KerraCel Ag Gelling Fiber Dressing, 4x5 in (silver alginate) 1 x Per Week/15 Days ary Discharge Instructions: Apply silver alginate to wound bed as instructed Secondary Dressing: Woven Gauze Sponge, Non-Sterile 4x4 in  1 x Per Week/15 Days Discharge Instructions: Apply over primary dressing as directed. Secondary Dressing: ABD Pad, 8x10 1 x Per Week/15 Days Discharge Instructions: Apply over primary dressing as directed. Compression Wrap: ThreePress (3 layer compression wrap) 1 x Per Week/15 Days Discharge Instructions: Apply three layer compression as directed. Wound #17 - Lower Leg Wound Laterality: Left, Posterior Cleanser: Soap and Water 1 x Per Week/15 Days Discharge Instructions: May shower and wash wound with dial antibacterial soap and water prior to dressing change. Cleanser: Wound Cleanser 1 x Per Week/15 Days Discharge Instructions: Cleanse the wound with wound cleanser prior to applying a clean dressing using gauze sponges, not tissue or cotton balls. Peri-Wound Care: Triamcinolone 15 (g) 1 x Per Week/15 Days Discharge Instructions: In clinic only. Use triamcinolone 15 (g) in clinic only. Peri-Wound Care: Sween Lotion (Moisturizing lotion) 1 x Per Week/15 Days Discharge Instructions: Apply moisturizing lotion as directed Prim Dressing: KerraCel Ag Gelling Fiber Dressing, 4x5 in (silver alginate) 1 x Per Week/15 Days ary Discharge Instructions: Apply silver alginate to wound bed as instructed Secondary Dressing: Woven Gauze Sponge, Non-Sterile 4x4 in 1 x Per Week/15 Days Discharge Instructions: Apply over primary dressing as directed. Secondary Dressing: ABD Pad, 8x10 1 x Per Week/15 Days Discharge Instructions: Apply over primary dressing as directed. Compression Wrap: ThreePress (3 layer compression wrap) 1 x Per Week/15 Days Discharge Instructions: Apply  three layer compression as directed. Wound #7R - Lower Leg Wound Laterality: Left, Anterior Cleanser: Soap and Water 1 x Per Week/15 Days Discharge Instructions: May shower and wash wound with dial antibacterial soap and water prior to dressing change. Cleanser: Wound Cleanser 1 x Per Week/15 Days Discharge Instructions: Cleanse the wound with wound cleanser prior to applying a clean dressing using gauze sponges, not tissue or cotton balls. Peri-Wound Care: Triamcinolone 15 (g) 1 x Per Week/15 Days Discharge Instructions: In clinic only. Use triamcinolone 15 (g) in clinic only. Peri-Wound Care: Sween Lotion (Moisturizing lotion) 1 x Per Week/15 Days Discharge Instructions: Apply moisturizing lotion as directed Prim Dressing: KerraCel Ag Gelling Fiber Dressing, 4x5 in (silver alginate) 1 x Per Week/15 Days ary Discharge Instructions: Apply silver alginate to wound bed as instructed Secondary Dressing: Woven Gauze Sponge, Non-Sterile 4x4 in 1 x Per Week/15 Days Discharge Instructions: Apply over primary dressing as directed. Secondary Dressing: ABD Pad, 8x10 1 x Per Week/15 Days Discharge Instructions: Apply over primary dressing as directed. Compression Wrap: ThreePress (3 layer compression wrap) 1 x Per Week/15 Days Discharge Instructions: Apply three layer compression as directed. Wound #8 - Lower Leg Wound Laterality: Right, Lateral Cleanser: Soap and Water 1 x Per Week/15 Days Discharge Instructions: May shower and wash wound with dial antibacterial soap and water prior to dressing change. Cleanser: Wound Cleanser 1 x Per Week/15 Days Discharge Instructions: Cleanse the wound with wound cleanser prior to applying a clean dressing using gauze sponges, not tissue or cotton balls. Peri-Wound Care: Triamcinolone 15 (g) 1 x Per Week/15 Days Discharge Instructions: In clinic only. Use triamcinolone 15 (g) in clinic only. Peri-Wound Care: Sween Lotion (Moisturizing lotion) 1 x Per Week/15  Days Discharge Instructions: Apply moisturizing lotion as directed Prim Dressing: KerraCel Ag Gelling Fiber Dressing, 4x5 in (silver alginate) 1 x Per Week/15 Days ary Discharge Instructions: Apply silver alginate to wound bed as instructed Secondary Dressing: Woven Gauze Sponge, Non-Sterile 4x4 in 1 x Per Week/15 Days Discharge Instructions: Apply over primary dressing as directed. Secondary Dressing: ABD Pad, 8x10 1 x Per Week/15 Days Discharge Instructions:  Apply over primary dressing as directed. Compression Wrap: ThreePress (3 layer compression wrap) 1 x Per Week/15 Days Discharge Instructions: Apply three layer compression as directed. Wound #9 - Lower Leg Wound Laterality: Left, Posterior, Distal Cleanser: Soap and Water 1 x Per Week/15 Days Discharge Instructions: May shower and wash wound with dial antibacterial soap and water prior to dressing change. Cleanser: Wound Cleanser 1 x Per Week/15 Days Discharge Instructions: Cleanse the wound with wound cleanser prior to applying a clean dressing using gauze sponges, not tissue or cotton balls. Peri-Wound Care: Triamcinolone 15 (g) 1 x Per Week/15 Days Discharge Instructions: In clinic only. Use triamcinolone 15 (g) in clinic only. Peri-Wound Care: Sween Lotion (Moisturizing lotion) 1 x Per Week/15 Days Discharge Instructions: Apply moisturizing lotion as directed Prim Dressing: KerraCel Ag Gelling Fiber Dressing, 4x5 in (silver alginate) 1 x Per Week/15 Days ary Discharge Instructions: Apply silver alginate to wound bed as instructed Secondary Dressing: Woven Gauze Sponge, Non-Sterile 4x4 in 1 x Per Week/15 Days Discharge Instructions: Apply over primary dressing as directed. Secondary Dressing: ABD Pad, 8x10 1 x Per Week/15 Days Discharge Instructions: Apply over primary dressing as directed. Compression Wrap: ThreePress (3 layer compression wrap) 1 x Per Week/15 Days Discharge Instructions: Apply three layer compression as  directed. Electronic Signature(s) Signed: 11/15/2020 1:21:05 PM By: Kalman Shan DO Entered By: Kalman Shan on 11/15/2020 13:17:48 -------------------------------------------------------------------------------- Problem List Details Patient Name: Date of Service: [name], [place]. 11/15/2020 11:15 A M Medical Record Number: [id number] Patient Account Number: [id number] Date of Birth/Sex: Treating RN: 1925-02-25 (87 y.o. [name], [name] Primary Care Provider: [name] Other Clinician: Referring Provider: Treating Provider/Extender: [name] in Treatment: 22 Active Problems ICD-10 Encounter Code Description Active Date MDM Diagnosis C44.92 Squamous cell carcinoma of skin, unspecified 06/11/2020 No Yes L97.819 Non-pressure chronic ulcer of other part of right lower leg with unspecified 06/11/2020 No Yes severity L97.829 Non-pressure chronic ulcer of other part of left lower leg with unspecified 06/11/2020 No Yes severity I87.2 Venous insufficiency (chronic) (peripheral) 06/11/2020 No Yes Inactive Problems Resolved Problems Electronic Signature(s) Signed: 11/15/2020 1:21:05 PM By: Kalman Shan DO Entered By: Kalman Shan on 11/15/2020 13:05:24 -------------------------------------------------------------------------------- Progress Note Details Patient Name: Date of Service: [name], [place]. 11/15/2020 11:15 A M Medical Record Number: [id number] Patient Account Number: [id number] Date of Birth/Sex: Treating RN: 1925/10/06 (87 y.o. [name] Primary Care Provider: [name] Other Clinician: Referring Provider: Treating Provider/Extender: [name] in Treatment: 22 Subjective Chief Complaint Information obtained from Patient Bilateral lower extremity wounds that have been biopsied and positive for squamous cell carcinoma History of Present Illness  (HPI) The following HPI elements were documented for the patient's wound: Location: left leg Admission 5/23 Ms. 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 She states that the wound on the right has been present for a year. The 1 on the left has opened up 1 month ago. She is followed with oncology for this issue as she had biopsies that showed squamous cell carcinoma. She is also seeing radiation oncology for treatment options. She presents today because she would like for her wounds to be healed by [place]. She currently denies signs of infection. 6/1; patient presents for 1 week follow-up. She states she  has tolerated the leg wraps well. She states these do not bother her and is happy to continue with them. She is scheduled to see her oncologist today to go over treatment options for the bilateral lower extremity squamous cell carcinoma. Radiation is currently not a recommended option. Patient states she overall feels well. 6/22; patient presents for 3-week follow-up. She has tolerated the wraps well until her last wrap where she states they were uncomfortable. She attributes this to the home health nurse. She denies signs of infection. She has started her first treatment of antibody infusions for her Bilateral lower extremity squamous cell carcinoma. She has no complaints today. 7/21; patient presents for 1 month follow-up. Unfortunately she has not had good experience with her wrap changes with home health. She would like to do her own dressing changes. She continues to do her antibody infusions. She denies signs of infection. 7/28; patient presents for 1 week follow-up. At last clinic visit she was switched to daily dressing changes due to issues with the wrap and home  health placing them. Unfortunately she has developed weeping to her legs bilaterally. She would like to be placed in wraps today. She would also like to follow with [place] weekly for wrap changes instead of having home health change them. She denies signs of infection. 8/4; patient presents for 1 week follow-up. She has tolerated the Kerlix/Coban wraps well. She no longer has weeping to her legs. She took the wrap off 1 day before coming in to be able to take a shower. She has no issues or complaints today. She denies signs of infection. 8/18; patient presents for follow-up. Patient has tolerated the wraps well. She brought her Velcro compression wraps today. She has no issues or complaints today. She had her chemotherapy infusion yesterday without issues. She denies signs of infection. 8/25; patient presents for follow-up. She used her juxta lite compressions for the past week. It is unclear if she is able to put these on correctly since she states she has a hard time getting them to look right. She reports 2 open wounds. She currently denies signs of infection. 9/1; patient presents for 1 week follow-up. She has 1 open wound. She tolerated the compression wraps well. She currently denies signs of infection. 9/8; patient presents for 1 week follow-up. She has 2 open wounds 1 on each leg. She has tolerated the compression wrap well. She currently denies signs of infection. 9/15; patient presents for 1 week follow-up. She now has 3 wounds. 2 on the left and 1 on the right. She continues to tolerate the compression wrap well. She currently denies signs of infection. She has obtained furosemide by her primary care physician and would like to discuss when to take this. 9/22; patient presents for 1 week follow-up. She has scattered wounds on her lower extremities bilaterally. She did take furosemide twice in the past week. She does not recall having to urinate more frequently. She tolerated the 3 layer  compression well. She denies signs of infection. 10/13; patient has not been here recently because of [place]. Apparently the facility was only putting gauze on her legs. This is a patient I do not normally see. 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 Ronnald Ramp at [facility] dermatology. She is quite convincing that she did not have radiation to her lower legs. It is likely she also has significant chronic venous insufficiency stasis dermatitis. We  have been using silver alginate under kerlix Coban. She has obvious open areas medially on the right and areas on the left. She also has areas of extensive dry flaking adherent areas on the right and to a lesser extent on the left anterior. Nodular areas on the left lateral lower leg left posterior calf and right mid calf medially. 10/21; patient presents for follow-up. She has no issues or complaints today. She has tolerated the 3 layer compression well bilaterally. She denies signs of infection. 10/27; patient presents for follow-up. She continues to tolerate the 3 layer compression wrap well. She denies signs of infection. Patient History Information obtained from Patient. Family History Unknown History. Social History Never smoker, Marital Status - Single, Alcohol Use - Never, Drug Use - No History, Caffeine Use - Never. Medical History Eyes Denies history of Cataracts, Optic Neuritis Ear/Nose/Mouth/Throat Denies history of Chronic sinus problems/congestion, Middle ear problems Hematologic/Lymphatic Patient has history of Anemia Denies history of Hemophilia, Human Immunodeficiency Virus, Lymphedema, Sickle Cell Disease Respiratory Denies history of Aspiration, Asthma, Chronic Obstructive Pulmonary Disease (COPD), Pneumothorax, Sleep Apnea, Tuberculosis Cardiovascular Patient has history of Arrhythmia - Atrial Flutter, A fibb, Congestive Heart Failure,  Hypertension Denies history of Angina, Coronary Artery Disease, Deep Vein Thrombosis, Hypotension, Myocardial Infarction, Peripheral Arterial Disease, Peripheral Venous Disease, Phlebitis, Vasculitis Gastrointestinal Patient has history of Colitis Denies history of Cirrhosis , Crohnoos, Hepatitis A, Hepatitis B, Hepatitis C Endocrine Denies history of Type I Diabetes, Type II Diabetes Genitourinary Denies history of End Stage Renal Disease Immunological Denies history of Lupus Erythematosus, Raynaudoos, Scleroderma Integumentary (Skin) Denies history of History of Burn Musculoskeletal Patient has history of Osteoarthritis Denies history of Gout, Rheumatoid Arthritis, Osteomyelitis Neurologic Denies history of Dementia, Neuropathy, Quadriplegia, Paraplegia, Seizure Disorder Oncologic Denies history of Received Chemotherapy, Received Radiation Hospitalization/Surgery History - removal of rod left leg. 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 Vitals Time Taken: 11:43 AM, Height: 65 in, Weight: 163 lbs, BMI: 27.1, Temperature: 97.5 F, Pulse: 63 bpm, Respiratory Rate: 18 breaths/min, Blood Pressure: 125/63 mmHg. Cardiovascular 2+ dorsalis pedis/posterior tibialis pulses. Psychiatric pleasant and cooperative. General Notes: Very small scattered open wounds to her legs bilaterally limited to skin breakdown. The largest wound is to the posterior left lower extremity with granulation tissue present. No signs of infection. Integumentary (Hair, Skin) Wound #10 status is Open. Original cause of wound was Gradually Appeared. The date acquired was: 10/11/2020. The wound has been in treatment 5 weeks. The wound is located on the Left,Distal,Medial Lower Leg. The wound  measures 1.2cm length x 1.6cm width x 0.1cm depth; 1.508cm^2 area and 0.151cm^3 volume. There is Fat Layer (Subcutaneous Tissue) exposed. There is no tunneling or undermining noted. There is a medium amount of serosanguineous drainage noted. The wound margin is distinct with the outline attached to the wound base. There is large (67-100%) red, pink granulation within the wound bed. There is no necrotic tissue within the wound bed. Wound #11 status is Open. Original cause of wound was Gradually Appeared. The date acquired was: 11/01/2020. The wound has been in treatment 2 weeks. The wound is located on the Left,Proximal,Medial Lower Leg. The wound measures 1.1cm length x 0.4cm width x 0.1cm depth; 0.346cm^2 area and 0.035cm^3 volume. There is Fat Layer (Subcutaneous Tissue) exposed. There is no tunneling or undermining noted. There is a medium amount of serous drainage noted. The wound  margin is distinct with the outline attached to the wound base. There is large (67-100%) red, pink granulation within the wound bed. There is no necrotic tissue within the wound bed. Wound #12 status is Open. Original cause of wound was Gradually Appeared. The date acquired was: 11/01/2020. The wound has been in treatment 2 weeks. The wound is located on the Left,Lateral Lower Leg. The wound measures 0.5cm length x 0.7cm width x 0.1cm depth; 0.275cm^2 area and 0.027cm^3 volume. There is Fat Layer (Subcutaneous Tissue) exposed. There is no tunneling or undermining noted. There is a medium amount of serous drainage noted. The wound margin is distinct with the outline attached to the wound base. There is large (67-100%) red, pink granulation within the wound bed. There is no necrotic tissue within the wound bed. Wound #13 status is Open. Original cause of wound was Gradually Appeared. The date acquired was: 11/01/2020. The wound has been in treatment 2 weeks. The wound is located on the Right,Proximal,Lateral Lower Leg. The  wound measures 0.3cm length x 0.3cm width x 0.1cm depth; 0.071cm^2 area and 0.007cm^3 volume. There is Fat Layer (Subcutaneous Tissue) exposed. There is no tunneling or undermining noted. There is a medium amount of serous drainage noted. The wound margin is distinct with the outline attached to the wound base. There is large (67-100%) red granulation within the wound bed. There is no necrotic tissue within the wound bed. Wound #14 status is Healed - Epithelialized. Original cause of wound was Gradually Appeared. The date acquired was: 11/01/2020. The wound has been in treatment 2 weeks. The wound is located on the Right,Medial Lower Leg. The wound measures 0cm length x 0cm width x 0cm depth; 0cm^2 area and 0cm^3 volume. Wound #15 status is Open. Original cause of wound was Gradually Appeared. The date acquired was: 11/08/2020. The wound has been in treatment 1 weeks. The wound is located on the Right,Posterior Lower Leg. The wound measures 2cm length x 2.5cm width x 0.1cm depth; 3.927cm^2 area and 0.393cm^3 volume. There is Fat Layer (Subcutaneous Tissue) exposed. There is no tunneling or undermining noted. There is a medium amount of serosanguineous drainage noted. The wound margin is distinct with the outline attached to the wound base. There is large (67-100%) red granulation within the wound bed. There is no necrotic tissue within the wound bed. Wound #16 status is Open. Original cause of wound was Gradually Appeared. The date acquired was: 11/08/2020. The wound has been in treatment 1 weeks. The wound is located on the Left,Proximal,Posterior Lower Leg. The wound measures 1cm length x 0.8cm width x 0.1cm depth; 0.628cm^2 area and 0.063cm^3 volume. There is Fat Layer (Subcutaneous Tissue) exposed. There is no tunneling or undermining noted. There is a medium amount of serosanguineous drainage noted. The wound margin is distinct with the outline attached to the wound base. There is large (67-100%)  red granulation within the wound bed. There is no necrotic tissue within the wound bed. Wound #17 status is Open. Original cause of wound was Gradually Appeared. The date acquired was: 11/08/2020. The wound has been in treatment 1 weeks. The wound is located on the Left,Posterior Lower Leg. The wound measures 1.5cm length x 1.2cm width x 0.1cm depth; 1.414cm^2 area and 0.141cm^3 volume. There is Fat Layer (Subcutaneous Tissue) exposed. There is no tunneling or undermining noted. There is a medium amount of serosanguineous drainage noted. The wound margin is distinct with the outline attached to the wound base. There is large (67-100%) red, pink granulation within  the wound bed. There is no necrotic tissue within the wound bed. Wound #7R status is Open. Original cause of wound was Blister. The date acquired was: 04/20/2020. The wound has been in treatment 22 weeks. The wound is located on the Left,Anterior Lower Leg. The wound measures 0.4cm length x 0.9cm width x 0.1cm depth; 0.283cm^2 area and 0.028cm^3 volume. There is Fat Layer (Subcutaneous Tissue) exposed. There is no tunneling or undermining noted. There is a medium amount of serosanguineous drainage noted. The wound margin is distinct with the outline attached to the wound base. There is large (67-100%) red, pink granulation within the wound bed. There is no necrotic tissue within the wound bed. Wound #8 status is Open. Original cause of wound was Gradually Appeared. The date acquired was: 09/27/2020. The wound has been in treatment 7 weeks. The wound is located on the Right,Lateral Lower Leg. The wound measures 0.6cm length x 1.1cm width x 0.1cm depth; 0.518cm^2 area and 0.052cm^3 volume. There is Fat Layer (Subcutaneous Tissue) exposed. There is no tunneling or undermining noted. There is a medium amount of serous drainage noted. The wound margin is distinct with the outline attached to the wound base. There is large (67-100%) red, pink  granulation within the wound bed. There is no necrotic tissue within the wound bed. Wound #9 status is Open. Original cause of wound was Gradually Appeared. The date acquired was: 10/04/2020. The wound has been in treatment 6 weeks. The wound is located on the Left,Distal,Posterior Lower Leg. The wound measures 4.3cm length x 4cm width x 0.1cm depth; 13.509cm^2 area and 1.351cm^3 volume. There is Fat Layer (Subcutaneous Tissue) exposed. There is no tunneling or undermining noted. There is a medium amount of serosanguineous drainage noted. The wound margin is distinct with the outline attached to the wound base. There is large (67-100%) red, pink granulation within the wound bed. There is a small (1-33%) amount of necrotic tissue within the wound bed including Adherent Slough. Assessment Active Problems ICD-10 Squamous cell carcinoma of skin, unspecified Non-pressure chronic ulcer of other part of right lower leg with unspecified severity Non-pressure chronic ulcer of other part of left lower leg with unspecified severity Venous insufficiency (chronic) (peripheral) There is overall improvement in appearance to the patient's wounds bilaterally. There is good edema control. She is tolerating the wraps well. The main wound is on the left lower posterior leg. We will continue with silver alginate under 3 layer compression. No signs of infection. Procedures Wound #10 Pre-procedure diagnosis of Wound #10 is a Venous Leg Ulcer located on the Left,Distal,Medial Lower Leg . There was a Three Layer Compression Therapy Procedure by Lorrin Jackson, RN. Post procedure Diagnosis Wound #10: Same as Pre-Procedure Wound #11 Pre-procedure diagnosis of Wound #11 is a Venous Leg Ulcer located on the Left,Proximal,Medial Lower Leg . There was a Three Layer Compression Therapy Procedure by Lorrin Jackson, RN. Post procedure Diagnosis Wound #11: Same as Pre-Procedure Wound #12 Pre-procedure diagnosis of Wound #12  is a Venous Leg Ulcer located on the Left,Lateral Lower Leg . There was a Three Layer Compression Therapy Procedure by Lorrin Jackson, RN. Post procedure Diagnosis Wound #12: Same as Pre-Procedure Wound #13 Pre-procedure diagnosis of Wound #13 is a Venous Leg Ulcer located on the Right,Proximal,Lateral Lower Leg . There was a Three Layer Compression Therapy Procedure by Lorrin Jackson, RN. Post procedure Diagnosis Wound #13: Same as Pre-Procedure Wound #15 Pre-procedure diagnosis of Wound #15 is a Venous Leg Ulcer located on the Right,Posterior Lower Leg .  There was a Three Layer Compression Therapy Procedure by Lorrin Jackson, RN. Post procedure Diagnosis Wound #15: Same as Pre-Procedure Wound #16 Pre-procedure diagnosis of Wound #16 is a Venous Leg Ulcer located on the Left,Proximal,Posterior Lower Leg . There was a Three Layer Compression Therapy Procedure by Lorrin Jackson, RN. Post procedure Diagnosis Wound #16: Same as Pre-Procedure Wound #17 Pre-procedure diagnosis of Wound #17 is a Venous Leg Ulcer located on the Left,Posterior Lower Leg . There was a Three Layer Compression Therapy Procedure by Lorrin Jackson, RN. Post procedure Diagnosis Wound #17: Same as Pre-Procedure Wound #7R Pre-procedure diagnosis of Wound #7R is a Malignant Wound located on the Left,Anterior Lower Leg . There was a Three Layer Compression Therapy Procedure by Lorrin Jackson, RN. Post procedure Diagnosis Wound #7R: Same as Pre-Procedure Wound #8 Pre-procedure diagnosis of Wound #8 is a Venous Leg Ulcer located on the Right,Lateral Lower Leg . There was a Three Layer Compression Therapy Procedure by Lorrin Jackson, RN. Post procedure Diagnosis Wound #8: Same as Pre-Procedure Wound #9 Pre-procedure diagnosis of Wound #9 is a Venous Leg Ulcer located on the Left,Distal,Posterior Lower Leg . There was a Three Layer Compression Therapy Procedure by Lorrin Jackson, RN. Post procedure Diagnosis Wound #9: Same  as Pre-Procedure Plan Follow-up Appointments: Return Appointment in 1 week. - with Dr. Heber [name] Bathing/ Shower/ Hygiene: May shower with protection but do not get wound dressing(s) wet. - Use cast protector Edema Control - Lymphedema / SCD / Other: Elevate legs to the level of the heart or above for 30 minutes daily and/or when sitting, a frequency of: - elevate the legs throughout the day heart level if possible. Avoid standing for long periods of time. Exercise regularly Additional Orders / Instructions: Follow Nutritious Diet WOUND #10: - Lower Leg Wound Laterality: Left, Medial, Distal Cleanser: Soap and Water 1 x Per Week/15 Days Discharge Instructions: May shower and wash wound with dial antibacterial soap and water prior to dressing change. Cleanser: Wound Cleanser 1 x Per Week/15 Days Discharge Instructions: Cleanse the wound with wound cleanser prior to applying a clean dressing using gauze sponges, not tissue or cotton balls. Peri-Wound Care: Triamcinolone 15 (g) 1 x Per Week/15 Days Discharge Instructions: In clinic only. Use triamcinolone 15 (g) in clinic only. Peri-Wound Care: Sween Lotion (Moisturizing lotion) 1 x Per Week/15 Days Discharge Instructions: Apply moisturizing lotion as directed Prim Dressing: KerraCel Ag Gelling Fiber Dressing, 4x5 in (silver alginate) 1 x Per Week/15 Days ary Discharge Instructions: Apply silver alginate to wound bed as instructed Secondary Dressing: Woven Gauze Sponge, Non-Sterile 4x4 in 1 x Per Week/15 Days Discharge Instructions: Apply over primary dressing as directed. Secondary Dressing: ABD Pad, 8x10 1 x Per Week/15 Days Discharge Instructions: Apply over primary dressing as directed. Com pression Wrap: ThreePress (3 layer compression wrap) 1 x Per Week/15 Days Discharge Instructions: Apply three layer compression as directed. WOUND #11: - Lower Leg Wound Laterality: Left, Medial, Proximal Cleanser: Soap and Water 1 x Per Week/15  Days Discharge Instructions: May shower and wash wound with dial antibacterial soap and water prior to dressing change. Cleanser: Wound Cleanser 1 x Per Week/15 Days Discharge Instructions: Cleanse the wound with wound cleanser prior to applying a clean dressing using gauze sponges, not tissue or cotton balls. Peri-Wound Care: Triamcinolone 15 (g) 1 x Per Week/15 Days Discharge Instructions: In clinic only. Use triamcinolone 15 (g) in clinic only. Peri-Wound Care: Sween Lotion (Moisturizing lotion) 1 x Per Week/15 Days Discharge Instructions: Apply moisturizing lotion as directed  Prim Dressing: KerraCel Ag Gelling Fiber Dressing, 4x5 in (silver alginate) 1 x Per Week/15 Days ary Discharge Instructions: Apply silver alginate to wound bed as instructed Secondary Dressing: Woven Gauze Sponge, Non-Sterile 4x4 in 1 x Per Week/15 Days Discharge Instructions: Apply over primary dressing as directed. Secondary Dressing: ABD Pad, 8x10 1 x Per Week/15 Days Discharge Instructions: Apply over primary dressing as directed. Com pression Wrap: ThreePress (3 layer compression wrap) 1 x Per Week/15 Days Discharge Instructions: Apply three layer compression as directed. WOUND #12: - Lower Leg Wound Laterality: Left, Lateral Cleanser: Soap and Water 1 x Per Week/15 Days Discharge Instructions: May shower and wash wound with dial antibacterial soap and water prior to dressing change. Cleanser: Wound Cleanser 1 x Per Week/15 Days Discharge Instructions: Cleanse the wound with wound cleanser prior to applying a clean dressing using gauze sponges, not tissue or cotton balls. Peri-Wound Care: Triamcinolone 15 (g) 1 x Per Week/15 Days Discharge Instructions: In clinic only. Use triamcinolone 15 (g) in clinic only. Peri-Wound Care: Sween Lotion (Moisturizing lotion) 1 x Per Week/15 Days Discharge Instructions: Apply moisturizing lotion as directed Prim Dressing: KerraCel Ag Gelling Fiber Dressing, 4x5 in (silver  alginate) 1 x Per Week/15 Days ary Discharge Instructions: Apply silver alginate to wound bed as instructed Secondary Dressing: Woven Gauze Sponge, Non-Sterile 4x4 in 1 x Per Week/15 Days Discharge Instructions: Apply over primary dressing as directed. Secondary Dressing: ABD Pad, 8x10 1 x Per Week/15 Days Discharge Instructions: Apply over primary dressing as directed. Com pression Wrap: ThreePress (3 layer compression wrap) 1 x Per Week/15 Days Discharge Instructions: Apply three layer compression as directed. WOUND #13: - Lower Leg Wound Laterality: Right, Lateral, Proximal Cleanser: Soap and Water 1 x Per Week/15 Days Discharge Instructions: May shower and wash wound with dial antibacterial soap and water prior to dressing change. Cleanser: Wound Cleanser 1 x Per Week/15 Days Discharge Instructions: Cleanse the wound with wound cleanser prior to applying a clean dressing using gauze sponges, not tissue or cotton balls. Peri-Wound Care: Triamcinolone 15 (g) 1 x Per Week/15 Days Discharge Instructions: In clinic only. Use triamcinolone 15 (g) in clinic only. Peri-Wound Care: Sween Lotion (Moisturizing lotion) 1 x Per Week/15 Days Discharge Instructions: Apply moisturizing lotion as directed Prim Dressing: KerraCel Ag Gelling Fiber Dressing, 4x5 in (silver alginate) 1 x Per Week/15 Days ary Discharge Instructions: Apply silver alginate to wound bed as instructed Secondary Dressing: Woven Gauze Sponge, Non-Sterile 4x4 in 1 x Per Week/15 Days Discharge Instructions: Apply over primary dressing as directed. Secondary Dressing: ABD Pad, 8x10 1 x Per Week/15 Days Discharge Instructions: Apply over primary dressing as directed. Com pression Wrap: ThreePress (3 layer compression wrap) 1 x Per Week/15 Days Discharge Instructions: Apply three layer compression as directed. WOUND #15: - Lower Leg Wound Laterality: Right, Posterior Cleanser: Soap and Water 1 x Per Week/15 Days Discharge  Instructions: May shower and wash wound with dial antibacterial soap and water prior to dressing change. Cleanser: Wound Cleanser 1 x Per Week/15 Days Discharge Instructions: Cleanse the wound with wound cleanser prior to applying a clean dressing using gauze sponges, not tissue or cotton balls. Peri-Wound Care: Triamcinolone 15 (g) 1 x Per Week/15 Days Discharge Instructions: In clinic only. Use triamcinolone 15 (g) in clinic only. Peri-Wound Care: Sween Lotion (Moisturizing lotion) 1 x Per Week/15 Days Discharge Instructions: Apply moisturizing lotion as directed Prim Dressing: KerraCel Ag Gelling Fiber Dressing, 4x5 in (silver alginate) 1 x Per Week/15 Days ary Discharge Instructions: Apply silver  alginate to wound bed as instructed Secondary Dressing: Woven Gauze Sponge, Non-Sterile 4x4 in 1 x Per Week/15 Days Discharge Instructions: Apply over primary dressing as directed. Secondary Dressing: ABD Pad, 8x10 1 x Per Week/15 Days Discharge Instructions: Apply over primary dressing as directed. Com pression Wrap: ThreePress (3 layer compression wrap) 1 x Per Week/15 Days Discharge Instructions: Apply three layer compression as directed. WOUND #16: - Lower Leg Wound Laterality: Left, Posterior, Proximal Cleanser: Soap and Water 1 x Per Week/15 Days Discharge Instructions: May shower and wash wound with dial antibacterial soap and water prior to dressing change. Cleanser: Wound Cleanser 1 x Per Week/15 Days Discharge Instructions: Cleanse the wound with wound cleanser prior to applying a clean dressing using gauze sponges, not tissue or cotton balls. Peri-Wound Care: Triamcinolone 15 (g) 1 x Per Week/15 Days Discharge Instructions: In clinic only. Use triamcinolone 15 (g) in clinic only. Peri-Wound Care: Sween Lotion (Moisturizing lotion) 1 x Per Week/15 Days Discharge Instructions: Apply moisturizing lotion as directed Prim Dressing: KerraCel Ag Gelling Fiber Dressing, 4x5 in (silver  alginate) 1 x Per Week/15 Days ary Discharge Instructions: Apply silver alginate to wound bed as instructed Secondary Dressing: Woven Gauze Sponge, Non-Sterile 4x4 in 1 x Per Week/15 Days Discharge Instructions: Apply over primary dressing as directed. Secondary Dressing: ABD Pad, 8x10 1 x Per Week/15 Days Discharge Instructions: Apply over primary dressing as directed. Com pression Wrap: ThreePress (3 layer compression wrap) 1 x Per Week/15 Days Discharge Instructions: Apply three layer compression as directed. WOUND #17: - Lower Leg Wound Laterality: Left, Posterior Cleanser: Soap and Water 1 x Per Week/15 Days Discharge Instructions: May shower and wash wound with dial antibacterial soap and water prior to dressing change. Cleanser: Wound Cleanser 1 x Per Week/15 Days Discharge Instructions: Cleanse the wound with wound cleanser prior to applying a clean dressing using gauze sponges, not tissue or cotton balls. Peri-Wound Care: Triamcinolone 15 (g) 1 x Per Week/15 Days Discharge Instructions: In clinic only. Use triamcinolone 15 (g) in clinic only. Peri-Wound Care: Sween Lotion (Moisturizing lotion) 1 x Per Week/15 Days Discharge Instructions: Apply moisturizing lotion as directed Prim Dressing: KerraCel Ag Gelling Fiber Dressing, 4x5 in (silver alginate) 1 x Per Week/15 Days ary Discharge Instructions: Apply silver alginate to wound bed as instructed Secondary Dressing: Woven Gauze Sponge, Non-Sterile 4x4 in 1 x Per Week/15 Days Discharge Instructions: Apply over primary dressing as directed. Secondary Dressing: ABD Pad, 8x10 1 x Per Week/15 Days Discharge Instructions: Apply over primary dressing as directed. Com pression Wrap: ThreePress (3 layer compression wrap) 1 x Per Week/15 Days Discharge Instructions: Apply three layer compression as directed. WOUND #7R: - Lower Leg Wound Laterality: Left, Anterior Cleanser: Soap and Water 1 x Per Week/15 Days Discharge Instructions: May  shower and wash wound with dial antibacterial soap and water prior to dressing change. Cleanser: Wound Cleanser 1 x Per Week/15 Days Discharge Instructions: Cleanse the wound with wound cleanser prior to applying a clean dressing using gauze sponges, not tissue or cotton balls. Peri-Wound Care: Triamcinolone 15 (g) 1 x Per Week/15 Days Discharge Instructions: In clinic only. Use triamcinolone 15 (g) in clinic only. Peri-Wound Care: Sween Lotion (Moisturizing lotion) 1 x Per Week/15 Days Discharge Instructions: Apply moisturizing lotion as directed Prim Dressing: KerraCel Ag Gelling Fiber Dressing, 4x5 in (silver alginate) 1 x Per Week/15 Days ary Discharge Instructions: Apply silver alginate to wound bed as instructed Secondary Dressing: Woven Gauze Sponge, Non-Sterile 4x4 in 1 x Per Week/15 Days Discharge Instructions:  Apply over primary dressing as directed. Secondary Dressing: ABD Pad, 8x10 1 x Per Week/15 Days Discharge Instructions: Apply over primary dressing as directed. Com pression Wrap: ThreePress (3 layer compression wrap) 1 x Per Week/15 Days Discharge Instructions: Apply three layer compression as directed. WOUND #8: - Lower Leg Wound Laterality: Right, Lateral Cleanser: Soap and Water 1 x Per Week/15 Days Discharge Instructions: May shower and wash wound with dial antibacterial soap and water prior to dressing change. Cleanser: Wound Cleanser 1 x Per Week/15 Days Discharge Instructions: Cleanse the wound with wound cleanser prior to applying a clean dressing using gauze sponges, not tissue or cotton balls. Peri-Wound Care: Triamcinolone 15 (g) 1 x Per Week/15 Days Discharge Instructions: In clinic only. Use triamcinolone 15 (g) in clinic only. Peri-Wound Care: Sween Lotion (Moisturizing lotion) 1 x Per Week/15 Days Discharge Instructions: Apply moisturizing lotion as directed Prim Dressing: KerraCel Ag Gelling Fiber Dressing, 4x5 in (silver alginate) 1 x Per Week/15  Days ary Discharge Instructions: Apply silver alginate to wound bed as instructed Secondary Dressing: Woven Gauze Sponge, Non-Sterile 4x4 in 1 x Per Week/15 Days Discharge Instructions: Apply over primary dressing as directed. Secondary Dressing: ABD Pad, 8x10 1 x Per Week/15 Days Discharge Instructions: Apply over primary dressing as directed. Com pression Wrap: ThreePress (3 layer compression wrap) 1 x Per Week/15 Days Discharge Instructions: Apply three layer compression as directed. WOUND #9: - Lower Leg Wound Laterality: Left, Posterior, Distal Cleanser: Soap and Water 1 x Per Week/15 Days Discharge Instructions: May shower and wash wound with dial antibacterial soap and water prior to dressing change. Cleanser: Wound Cleanser 1 x Per Week/15 Days Discharge Instructions: Cleanse the wound with wound cleanser prior to applying a clean dressing using gauze sponges, not tissue or cotton balls. Peri-Wound Care: Triamcinolone 15 (g) 1 x Per Week/15 Days Discharge Instructions: In clinic only. Use triamcinolone 15 (g) in clinic only. Peri-Wound Care: Sween Lotion (Moisturizing lotion) 1 x Per Week/15 Days Discharge Instructions: Apply moisturizing lotion as directed Prim Dressing: KerraCel Ag Gelling Fiber Dressing, 4x5 in (silver alginate) 1 x Per Week/15 Days ary Discharge Instructions: Apply silver alginate to wound bed as instructed Secondary Dressing: Woven Gauze Sponge, Non-Sterile 4x4 in 1 x Per Week/15 Days Discharge Instructions: Apply over primary dressing as directed. Secondary Dressing: ABD Pad, 8x10 1 x Per Week/15 Days Discharge Instructions: Apply over primary dressing as directed. Com pression Wrap: ThreePress (3 layer compression wrap) 1 x Per Week/15 Days Discharge Instructions: Apply three layer compression as directed. 1. Silver alginate under 3 layer compression 2. Follow-up in 1 week Electronic Signature(s) Signed: 11/15/2020 1:21:05 PM By: Kalman Shan  DO Signed: 11/15/2020 1:21:05 PM By: Kalman Shan DO Entered By: Kalman Shan on 11/15/2020 13:20:19 -------------------------------------------------------------------------------- HxROS Details Patient Name: Date of Service: [name], [place]. 11/15/2020 11:15 A M Medical Record Number: [id number] Patient Account Number: [id number] Date of Birth/Sex: Treating RN: [birth year] (87 y.o. [name] Primary Care Provider: [name] Other Clinician: Referring Provider: Treating Provider/Extender: [name] in Treatment: 22 Information Obtained From Patient Eyes Medical History: Negative for: Cataracts; Optic Neuritis Ear/Nose/Mouth/Throat Medical History: Negative for: Chronic sinus problems/congestion; Middle ear problems Hematologic/Lymphatic Medical History: Positive for: Anemia Negative for: Hemophilia; Human Immunodeficiency Virus; Lymphedema; Sickle Cell Disease Respiratory Medical History: Negative for: Aspiration; Asthma; Chronic Obstructive Pulmonary Disease (COPD); Pneumothorax; Sleep Apnea; Tuberculosis Cardiovascular Medical History: Positive for: Arrhythmia - Atrial Flutter, A fibb; Congestive Heart Failure; Hypertension Negative for: Angina; Coronary Artery  Disease; Deep Vein Thrombosis; Hypotension; Myocardial Infarction; Peripheral Arterial Disease; Peripheral Venous Disease; Phlebitis; Vasculitis Past Medical History Notes: hyperlipidemia Gastrointestinal Medical History: Positive for: Colitis Negative for: Cirrhosis ; Crohns; Hepatitis A; Hepatitis B; Hepatitis C Endocrine Medical History: Negative for: Type I Diabetes; Type II Diabetes Past Medical History Notes: hypothyroidism Genitourinary Medical History: Negative for: End Stage Renal Disease Immunological Medical History: Negative for: Lupus Erythematosus; Raynauds; Scleroderma Integumentary (Skin) Medical History: Negative for:  History of Burn Musculoskeletal Medical History: Positive for: Osteoarthritis Negative for: Gout; Rheumatoid Arthritis; Osteomyelitis Neurologic Medical History: Negative for: Dementia; Neuropathy; Quadriplegia; Paraplegia; Seizure Disorder Past Medical History Notes: lumbar spindylolysis Oncologic Medical History: Negative for: Received Chemotherapy; Received Radiation Past Medical History Notes: BLE squamous ceel carcionoma Immunizations Pneumococcal Vaccine: Received Pneumococcal Vaccination: Yes Received Pneumococcal Vaccination On or After 60th Birthday: No Implantable Devices None Hospitalization / Surgery History Type of Hospitalization/Surgery removal of rod left leg Family and Social History Unknown History: Yes; Never smoker; Marital Status - Single; Alcohol Use: Never; Drug Use: No History; Caffeine Use: Never; Financial Concerns: No; Food, Clothing or Shelter Needs: No; Support System Lacking: No; Transportation Concerns: No Electronic Signature(s) Signed: 11/15/2020 1:21:05 PM By: Kalman Shan DO Signed: 11/16/2020 12:25:45 PM By: Rhae Hammock RN Entered By: Kalman Shan on 11/15/2020 13:15:41 -------------------------------------------------------------------------------- SuperBill Details Patient Name: Date of Service: [name], [place]im[place]ou A. 11/15/2020 Medical Record Number: [id number] Patient Account Number: [id number] Date of Birth/Sex: Treating RN: 22-Sep-1925 (87 y.o. [name] Primary Care Provider: [name] Other Clinician: Referring Provider: Treating Provider/Extender: [name] in Treatment: 22 Diagnosis Coding ICD-10 Codes Code Description C44.92 Squamous cell carcinoma of skin, unspecified L97.819 Non-pressure chronic ulcer of other part of right lower leg with unspecified severity L97.829 Non-pressure chronic ulcer of other part of left lower leg with unspecified severity I87.2  Venous insufficiency (chronic) (peripheral) Facility Procedures CPT4: Code [id number] Description: 81 BILATERAL: Application of multi-layer venous compression system; leg (below knee), including ankle and t. ICD-10 Diagnosis Description L97.819 Non-pressure chronic ulcer of other part of right lower leg with unspecified severity L97.829  Non-pressure chronic ulcer of other part of left lower leg with unspecified severity Modifier: Quantity: 1 Physician Procedures : CPT4 Code Description Modifier [id number] - WC PHYS LEVEL 3 - EST PT ICD-10 Diagnosis Description C44.92 Squamous cell carcinoma of skin, unspecified L97.819 Non-pressure chronic ulcer of other part of right lower leg with unspecified severity  L97.829 Non-pressure chronic ulcer of other part of left lower leg with unspecified severity I87.2 Venous insufficiency (chronic) (peripheral) Quantity: 1 Electronic Signature(s) Signed: 11/15/2020 1:21:05 PM By: Kalman Shan DO Entered By: Kalman Shan on 11/15/2020 13:20:37

## 2020-11-22 ENCOUNTER — Encounter (HOSPITAL_BASED_OUTPATIENT_CLINIC_OR_DEPARTMENT_OTHER): Payer: Medicare Other | Attending: Internal Medicine | Admitting: Internal Medicine

## 2020-11-22 ENCOUNTER — Other Ambulatory Visit: Payer: Self-pay

## 2020-11-22 DIAGNOSIS — L97829 Non-pressure chronic ulcer of other part of left lower leg with unspecified severity: Secondary | ICD-10-CM | POA: Diagnosis not present

## 2020-11-22 DIAGNOSIS — C4492 Squamous cell carcinoma of skin, unspecified: Secondary | ICD-10-CM

## 2020-11-22 DIAGNOSIS — L97819 Non-pressure chronic ulcer of other part of right lower leg with unspecified severity: Secondary | ICD-10-CM | POA: Insufficient documentation

## 2020-11-22 DIAGNOSIS — I872 Venous insufficiency (chronic) (peripheral): Secondary | ICD-10-CM | POA: Diagnosis not present

## 2020-11-22 DIAGNOSIS — Z85828 Personal history of other malignant neoplasm of skin: Secondary | ICD-10-CM | POA: Diagnosis not present

## 2020-11-26 ENCOUNTER — Telehealth: Payer: Self-pay | Admitting: Internal Medicine

## 2020-11-26 MED ORDER — LINACLOTIDE 290 MCG PO CAPS: 290.0000 ug | ORAL_CAPSULE | Freq: Every day | ORAL | 11 refills | Status: DC

## 2020-11-26 NOTE — Telephone Encounter (Signed)
Patient says she went to her cancer doctor Dr. Jana Hakim & dr advised her to start back taking the [place] 259mcg  Patient says dr did not write prescription & told her to get w/ her pcp to write new rx  Pharmacy:  [place], [place] - [address]  Phone:  [phone] Fax:  [phone]

## 2020-11-26 NOTE — Telephone Encounter (Signed)
Okay.  Thanks.

## 2020-11-27 ENCOUNTER — Other Ambulatory Visit: Payer: Self-pay | Admitting: Oncology

## 2020-11-27 NOTE — Progress Notes (Signed)
[name] ([id number]) , Visit Report for 11/22/2020 Chief Complaint Document Details Patient Name: Date of Service: [name], [name] 11/22/2020 11:15 A M Medical Record Number: [id number] Patient Account Number: [id number] Date of Birth/Sex: Treating RN: 1925-10-08 (87 y.o. [name], [name] Primary Care Provider: [name] Other Clinician: Referring Provider: Treating Provider/Extender: [name] in Treatment: 110 Information Obtained from: Patient Chief Complaint Bilateral lower extremity wounds that have been biopsied and positive for squamous cell carcinoma Electronic Signature(s) Signed: 11/22/2020 1:22:05 PM By: Kalman Shan DO Entered By: Kalman Shan on 11/22/2020 13:16:00 -------------------------------------------------------------------------------- HPI Details Patient Name: Date of Service: [name], [name] 11/22/2020 11:15 A M Medical Record Number: [id number] Patient Account Number: [id number] Date of Birth/Sex: Treating RN: 01-08-1926 (87 y.o. 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 She states that the wound on the right has been present for a year. The 1 on the left has opened up 1 month ago. She is followed with oncology for this issue as she had biopsies that showed squamous cell carcinoma. She is also seeing radiation oncology for treatment options. She presents today because she would like for her wounds to  be healed by [place]. She currently denies signs of infection. 6/1; patient presents for 1 week follow-up. She states she has tolerated the leg wraps well. She states these do not bother her and is happy to continue with them. She is scheduled to see her oncologist today to go over treatment options for the bilateral lower extremity squamous cell carcinoma. Radiation is currently not a recommended option. Patient states she overall feels well. 6/22; patient presents for 3-week follow-up. She has tolerated the wraps well until her last wrap where she states they were uncomfortable. She attributes this to the home health nurse. She denies signs of infection. She has started her first treatment of antibody infusions for her Bilateral lower extremity squamous cell carcinoma. She has no complaints today. 7/21; patient presents for 1 month follow-up. Unfortunately she has not had good experience with her wrap changes with home health. She would like to do her own dressing changes. She continues to do her antibody infusions. She denies signs of infection. 7/28; patient presents for 1 week follow-up. At last clinic visit she was switched to daily dressing changes due to issues with the wrap and home health placing them. Unfortunately she has developed weeping to her legs bilaterally. She would like to be placed in wraps today. She would also like to follow with [place] weekly for wrap changes instead of having home health change them. She denies signs of infection. 8/4; patient presents for 1 week follow-up. She has tolerated the Kerlix/Coban wraps well. She no longer has weeping to her legs. She took the wrap off 1 day before coming in to be able to take a shower. She has no issues or complaints today. She denies signs of infection. 8/18; patient presents for follow-up. Patient has tolerated the wraps well. She brought her Velcro compression wraps today. She has no issues or complaints today. She had her chemotherapy  infusion yesterday without issues. She denies signs of infection. 8/25; patient presents for follow-up. She used her juxta lite compressions for the past week. It is unclear if she is able to put these on correctly since she states she has a hard time getting them to look right. She reports 2 open wounds. She currently denies signs of infection. 9/1; patient presents for 1 week follow-up. She has 1 open wound. She tolerated the compression wraps well. She currently denies signs of infection. 9/8; patient presents for 1 week follow-up. She has 2 open wounds 1 on each leg. She has tolerated the compression wrap well. She currently denies signs of infection. 9/15; patient presents for 1 week follow-up. She now has 3 wounds. 2 on the left and 1 on the right. She continues to tolerate the compression wrap well. She currently denies signs of infection. She has obtained furosemide by her primary care physician and would like to discuss when to take this. 9/22; patient presents for 1 week follow-up. She has scattered wounds on her lower extremities bilaterally. She did take furosemide twice in the past week. She does not recall having to urinate more frequently. She tolerated the 3 layer compression well. She denies signs of infection. 10/13; patient has not been here recently because of [place]. Apparently the facility was only putting gauze on her legs. This is a patient I do not normally see. 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 Ronnald Ramp at [facility] dermatology. She is quite convincing that she did not have radiation to her lower legs. It is likely she also has significant chronic venous insufficiency stasis dermatitis. We have been using silver alginate under kerlix Coban. She has obvious open areas medially on the right and areas on the left. She also has areas of extensive dry flaking adherent areas on the right and to a  lesser extent on the left anterior. Nodular areas on the left lateral lower leg left posterior calf and right mid calf medially. 10/21; patient presents for follow-up. She has no issues or complaints today. She has tolerated the 3 layer compression well bilaterally. She denies signs of infection. 10/27; patient presents for follow-up. She continues to tolerate the 3 layer compression wrap well. She denies signs of infection. 11/30; patient presents for follow-up. She has no issues or complaints today. Electronic Signature(s) Signed: 11/22/2020 1:22:05 PM By: Kalman Shan DO Entered By: Kalman Shan on 11/22/2020 13:16:20 -------------------------------------------------------------------------------- Physical Exam Details Patient Name: Date of Service: [name], [name] 11/22/2020 11:15 A M Medical Record Number: [id number] Patient Account Number: [id number] Date of Birth/Sex: Treating RN: 05/31/1925 (87 y.o. [name], [name] Primary Care Provider: [name] Other Clinician: Referring Provider: Treating Provider/Extender: [name] in Treatment: 23 Constitutional respirations regular, non-labored and within target range for patient.. Cardiovascular 2+ dorsalis pedis/posterior tibialis pulses. Psychiatric pleasant and cooperative. Notes Very small scattered open wounds to her legs bilaterally limited to skin breakdown. The largest wound is to the posterior left lower extremity with granulation tissue present And islands of epithelialization occurring in the center. No signs of infection. Electronic Signature(s) Signed: 11/22/2020 1:22:05 PM By: Kalman Shan DO Entered By: Kalman Shan on 11/22/2020 13:17:09 -------------------------------------------------------------------------------- Physician Orders Details Patient Name: Date of Service: [name], [name] 11/22/2020 11:15 A M Medical Record Number: [id number] Patient  Account Number: [id number] Date of Birth/Sex: Treating RN: 1925/04/12 (87 y.o. 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 - with Dr. Heber Tightwad *73 Minutes* Return A Bathing/ Shower/ Hygiene May shower with protection but do not get wound dressing(s) wet. - Use cast protector Edema Control - Lymphedema / SCD / Other Elevate legs to the level of the heart or above for 30 minutes daily and/or when sitting, a frequency of: - elevate the legs throughout the day heart level if possible. Avoid standing for long periods of time. Exercise regularly Additional Orders / Instructions Follow Nutritious Diet Wound Treatment Wound #12 - Lower Leg Wound Laterality: Left, Lateral Cleanser: Soap and Water 1 x Per Week/15 Days Discharge Instructions: May shower and wash wound with dial antibacterial soap and water prior to dressing change. Cleanser: Wound Cleanser 1 x Per Week/15 Days Discharge Instructions: Cleanse the wound with wound cleanser prior to applying a clean dressing using gauze sponges, not tissue or cotton balls. Peri-Wound Care: Triamcinolone 15 (g) 1 x Per Week/15 Days Discharge Instructions: In clinic only. Use triamcinolone 15 (g) in clinic only. Peri-Wound Care: Sween Lotion (Moisturizing lotion) 1 x Per Week/15 Days Discharge Instructions: Apply moisturizing lotion as directed Prim Dressing: KerraCel Ag Gelling Fiber Dressing, 4x5 in  (silver alginate) 1 x Per Week/15 Days ary Discharge Instructions: Apply silver alginate to wound bed as instructed Secondary Dressing: Woven Gauze Sponge, Non-Sterile 4x4 in 1 x Per Week/15 Days Discharge Instructions: Apply over primary dressing as directed. Secondary Dressing: ABD Pad, 8x10 1 x Per Week/15 Days Discharge Instructions: Apply over primary dressing as directed. Compression Wrap: ThreePress (3 layer compression wrap) 1 x Per Week/15 Days Discharge Instructions: Apply three layer compression as directed. Wound #15 - Lower Leg Wound Laterality: Right, Posterior Cleanser: Soap and Water 1 x Per Week/15 Days Discharge Instructions: May shower and wash wound with dial antibacterial soap and water prior to dressing change. Cleanser: Wound Cleanser 1 x Per Week/15 Days Discharge Instructions: Cleanse the wound with wound cleanser prior to applying a clean dressing using gauze sponges, not tissue or cotton balls. Peri-Wound Care: Triamcinolone 15 (g) 1 x Per Week/15 Days Discharge Instructions: In clinic only. Use triamcinolone 15 (g) in clinic only. Peri-Wound Care: Sween Lotion (Moisturizing lotion) 1 x Per Week/15 Days Discharge Instructions: Apply moisturizing lotion as directed Prim Dressing: KerraCel Ag Gelling Fiber Dressing, 4x5 in (silver alginate) 1 x Per Week/15 Days ary Discharge Instructions: Apply silver alginate to wound bed as instructed Secondary Dressing: Woven Gauze Sponge, Non-Sterile 4x4 in 1 x Per Week/15 Days Discharge Instructions: Apply over primary dressing as directed. Secondary Dressing: ABD Pad, 8x10 1 x Per Week/15 Days Discharge Instructions: Apply over primary dressing as directed. Compression Wrap: ThreePress (3 layer compression wrap) 1 x Per Week/15 Days Discharge Instructions: Apply three layer compression as directed. Wound #16 - Lower Leg Wound Laterality: Left, Posterior, Proximal Cleanser: Soap and Water 1 x Per Week/15 Days Discharge  Instructions: May shower and wash wound with dial antibacterial soap and water prior to dressing change. Cleanser: Wound Cleanser 1 x Per Week/15 Days Discharge Instructions: Cleanse the wound with wound cleanser prior to  applying a clean dressing using gauze sponges, not tissue or cotton balls. Peri-Wound Care: Triamcinolone 15 (g) 1 x Per Week/15 Days Discharge Instructions: In clinic only. Use triamcinolone 15 (g) in clinic only. Peri-Wound Care: Sween Lotion (Moisturizing lotion) 1 x Per Week/15 Days Discharge Instructions: Apply moisturizing lotion as directed Prim Dressing: KerraCel Ag Gelling Fiber Dressing, 4x5 in (silver alginate) 1 x Per Week/15 Days ary Discharge Instructions: Apply silver alginate to wound bed as instructed Secondary Dressing: Woven Gauze Sponge, Non-Sterile 4x4 in 1 x Per Week/15 Days Discharge Instructions: Apply over primary dressing as directed. Secondary Dressing: ABD Pad, 8x10 1 x Per Week/15 Days Discharge Instructions: Apply over primary dressing as directed. Compression Wrap: ThreePress (3 layer compression wrap) 1 x Per Week/15 Days Discharge Instructions: Apply three layer compression as directed. Wound #7R - Lower Leg Wound Laterality: Left, Anterior Cleanser: Soap and Water 1 x Per Week/15 Days Discharge Instructions: May shower and wash wound with dial antibacterial soap and water prior to dressing change. Cleanser: Wound Cleanser 1 x Per Week/15 Days Discharge Instructions: Cleanse the wound with wound cleanser prior to applying a clean dressing using gauze sponges, not tissue or cotton balls. Peri-Wound Care: Triamcinolone 15 (g) 1 x Per Week/15 Days Discharge Instructions: In clinic only. Use triamcinolone 15 (g) in clinic only. Peri-Wound Care: Sween Lotion (Moisturizing lotion) 1 x Per Week/15 Days Discharge Instructions: Apply moisturizing lotion as directed Prim Dressing: KerraCel Ag Gelling Fiber Dressing, 4x5 in (silver alginate) 1 x Per  Week/15 Days ary Discharge Instructions: Apply silver alginate to wound bed as instructed Secondary Dressing: Woven Gauze Sponge, Non-Sterile 4x4 in 1 x Per Week/15 Days Discharge Instructions: Apply over primary dressing as directed. Secondary Dressing: ABD Pad, 8x10 1 x Per Week/15 Days Discharge Instructions: Apply over primary dressing as directed. Compression Wrap: ThreePress (3 layer compression wrap) 1 x Per Week/15 Days Discharge Instructions: Apply three layer compression as directed. Wound #9 - Lower Leg Wound Laterality: Left, Posterior, Distal Cleanser: Soap and Water 1 x Per Week/15 Days Discharge Instructions: May shower and wash wound with dial antibacterial soap and water prior to dressing change. Cleanser: Wound Cleanser 1 x Per Week/15 Days Discharge Instructions: Cleanse the wound with wound cleanser prior to applying a clean dressing using gauze sponges, not tissue or cotton balls. Peri-Wound Care: Triamcinolone 15 (g) 1 x Per Week/15 Days Discharge Instructions: In clinic only. Use triamcinolone 15 (g) in clinic only. Peri-Wound Care: Sween Lotion (Moisturizing lotion) 1 x Per Week/15 Days Discharge Instructions: Apply moisturizing lotion as directed Prim Dressing: KerraCel Ag Gelling Fiber Dressing, 4x5 in (silver alginate) 1 x Per Week/15 Days ary Discharge Instructions: Apply silver alginate to wound bed as instructed Secondary Dressing: Woven Gauze Sponge, Non-Sterile 4x4 in 1 x Per Week/15 Days Discharge Instructions: Apply over primary dressing as directed. Secondary Dressing: ABD Pad, 8x10 1 x Per Week/15 Days Discharge Instructions: Apply over primary dressing as directed. Compression Wrap: ThreePress (3 layer compression wrap) 1 x Per Week/15 Days Discharge Instructions: Apply three layer compression as directed. Electronic Signature(s) Signed: 11/22/2020 6:31:34 PM By: Lorrin Jackson Signed: 11/23/2020 2:24:13 PM By: Kalman Shan DO Entered By: Lorrin Jackson on 11/22/2020 13:25:17 -------------------------------------------------------------------------------- Problem List Details Patient Name: Date of Service: [name], [name] 11/22/2020 11:15 A M Medical Record Number: [id number] Patient Account Number: [id number] Date of Birth/Sex: Treating RN: 1925/08/09 (87 y.o. [name] Primary Care Provider: [name] Other Clinician: Referring Provider: Treating Provider/Extender: Ludger Nutting  Weeks in Treatment: 23 Active Problems ICD-10 Encounter Code Description Active Date MDM Diagnosis C44.92 Squamous cell carcinoma of skin, unspecified 06/11/2020 No Yes L97.819 Non-pressure chronic ulcer of other part of right lower leg with unspecified 06/11/2020 No Yes severity L97.829 Non-pressure chronic ulcer of other part of left lower leg with unspecified 06/11/2020 No Yes severity I87.2 Venous insufficiency (chronic) (peripheral) 06/11/2020 No Yes Inactive Problems Resolved Problems Electronic Signature(s) Signed: 11/22/2020 1:22:05 PM By: Kalman Shan DO Entered By: Kalman Shan on 11/22/2020 13:15:30 -------------------------------------------------------------------------------- Progress Note Details Patient Name: Date of Service: [name], [name] 11/22/2020 11:15 A M Medical Record Number: [id number] Patient Account Number: [id number] Date of Birth/Sex: Treating RN: October 05, 1925 (87 y.o. [name] Primary Care Provider: [name] Other Clinician: Referring Provider: Treating Provider/Extender: [name] in Treatment: 64 Subjective Chief Complaint Information obtained from Patient Bilateral lower extremity wounds that have been biopsied and positive for squamous cell carcinoma History of Present Illness (HPI) The following HPI elements were documented for the patient's wound: Location: left leg Admission 5/23 Ms. 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 She states that the wound on the right has been present for a year. The 1 on the left has opened up 1 month ago. She is followed with oncology for this issue as she had biopsies that showed squamous cell carcinoma. She is also seeing radiation oncology for treatment options. She presents today because she would like for her wounds to be healed by [place]. She currently denies signs of infection. 6/1; patient presents for 1 week follow-up. She states she has tolerated the leg wraps well. She states these do not bother her and is happy to continue with them. She is scheduled to see her oncologist today to go over treatment options for the bilateral lower extremity squamous cell carcinoma. Radiation is currently not a recommended option. Patient states she overall feels well. 6/22; patient presents for 3-week follow-up. She has tolerated the wraps well until her last wrap where she states they were uncomfortable. She attributes this to the home health nurse. She denies signs of infection. She has started her first treatment of antibody infusions for her Bilateral lower extremity squamous cell carcinoma. She has no complaints today. 7/21; patient presents for 1 month follow-up. Unfortunately she has not had good experience with her wrap changes with home health. She would like to do her own dressing changes. She continues to do her antibody infusions. She denies signs of infection. 7/28; patient presents for 1 week follow-up. At last clinic visit she was switched to daily dressing changes due to issues with the wrap and home health placing them. Unfortunately she has developed weeping to her legs bilaterally. She would like to be placed in wraps  today. She would also like to follow with [place] weekly for wrap changes instead of having home health change them. She denies signs of infection. 8/4; patient presents for 1 week follow-up. She has tolerated the Kerlix/Coban wraps well. She no longer has weeping to her legs. She took the wrap off 1 day before coming in to be able to take a shower. She has no issues or complaints today. She denies signs of infection. 8/18; patient presents for follow-up. Patient has tolerated the wraps well. She brought  her Velcro compression wraps today. She has no issues or complaints today. She had her chemotherapy infusion yesterday without issues. She denies signs of infection. 8/25; patient presents for follow-up. She used her juxta lite compressions for the past week. It is unclear if she is able to put these on correctly since she states she has a hard time getting them to look right. She reports 2 open wounds. She currently denies signs of infection. 9/1; patient presents for 1 week follow-up. She has 1 open wound. She tolerated the compression wraps well. She currently denies signs of infection. 9/8; patient presents for 1 week follow-up. She has 2 open wounds 1 on each leg. She has tolerated the compression wrap well. She currently denies signs of infection. 9/15; patient presents for 1 week follow-up. She now has 3 wounds. 2 on the left and 1 on the right. She continues to tolerate the compression wrap well. She currently denies signs of infection. She has obtained furosemide by her primary care physician and would like to discuss when to take this. 9/22; patient presents for 1 week follow-up. She has scattered wounds on her lower extremities bilaterally. She did take furosemide twice in the past week. She does not recall having to urinate more frequently. She tolerated the 3 layer compression well. She denies signs of infection. 10/13; patient has not been here recently because of [place]. Apparently the  facility was only putting gauze on her legs. This is a patient I do not normally see. 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 Ronnald Ramp at [facility] dermatology. She is quite convincing that she did not have radiation to her lower legs. It is likely she also has significant chronic venous insufficiency stasis dermatitis. We have been using silver alginate under kerlix Coban. She has obvious open areas medially on the right and areas on the left. She also has areas of extensive dry flaking adherent areas on the right and to a lesser extent on the left anterior. Nodular areas on the left lateral lower leg left posterior calf and right mid calf medially. 10/21; patient presents for follow-up. She has no issues or complaints today. She has tolerated the 3 layer compression well bilaterally. She denies signs of infection. 10/27; patient presents for follow-up. She continues to tolerate the 3 layer compression wrap well. She denies signs of infection. 11/30; patient presents for follow-up. She has no issues or complaints today. Patient History Information obtained from Patient. Family History Unknown History. Social History Never smoker, Marital Status - Single, Alcohol Use - Never, Drug Use - No History, Caffeine Use - Never. Medical History Eyes Denies history of Cataracts, Optic Neuritis Ear/Nose/Mouth/Throat Denies history of Chronic sinus problems/congestion, Middle ear problems Hematologic/Lymphatic Patient has history of Anemia Denies history of Hemophilia, Human Immunodeficiency Virus, Lymphedema, Sickle Cell Disease Respiratory Denies history of Aspiration, Asthma, Chronic Obstructive Pulmonary Disease (COPD), Pneumothorax, Sleep Apnea, Tuberculosis Cardiovascular Patient has history of Arrhythmia - Atrial Flutter, A fibb, Congestive Heart Failure, Hypertension Denies history of Angina, Coronary Artery  Disease, Deep Vein Thrombosis, Hypotension, Myocardial Infarction, Peripheral Arterial Disease, Peripheral Venous Disease, Phlebitis, Vasculitis Gastrointestinal Patient has history of Colitis Denies history of Cirrhosis , Crohnoos, Hepatitis A, Hepatitis B, Hepatitis C Endocrine Denies history of Type I Diabetes, Type II Diabetes Genitourinary Denies history of End Stage Renal Disease Immunological Denies history of Lupus Erythematosus, Raynaudoos, Scleroderma Integumentary (Skin) Denies history of History of Burn  Musculoskeletal Patient has history of Osteoarthritis Denies history of Gout, Rheumatoid Arthritis, Osteomyelitis Neurologic Denies history of Dementia, Neuropathy, Quadriplegia, Paraplegia, Seizure Disorder Oncologic Denies history of Received Chemotherapy, Received Radiation Hospitalization/Surgery History - removal of rod left leg. 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 Vitals Time Taken: 12:28 PM, Height: 65 in, Weight: 163 lbs, BMI: 27.1, Temperature: 98.5 F, Pulse: 82 bpm, Respiratory Rate: 18 breaths/min, Blood Pressure: 161/87 mmHg. Cardiovascular 2+ dorsalis pedis/posterior tibialis pulses. Psychiatric pleasant and cooperative. General Notes: Very small scattered open wounds to her legs bilaterally limited to skin breakdown. The largest wound is to the posterior left lower extremity with granulation tissue present And islands of epithelialization occurring in the center. No signs of infection. Integumentary (Hair, Skin) Wound #10 status is Healed - Epithelialized. Original cause of wound was Gradually Appeared. The date acquired was: 10/11/2020. The wound has been in treatment 6 weeks. The wound is located on the Left,Distal,Medial  Lower Leg. The wound measures 0cm length x 0cm width x 0cm depth; 0cm^2 area and 0cm^3 volume. Wound #11 status is Healed - Epithelialized. Original cause of wound was Gradually Appeared. The date acquired was: 11/01/2020. The wound has been in treatment 3 weeks. The wound is located on the Left,Proximal,Medial Lower Leg. The wound measures 0cm length x 0cm width x 0cm depth; 0cm^2 area and 0cm^3 volume. Wound #12 status is Open. Original cause of wound was Gradually Appeared. The date acquired was: 11/01/2020. The wound has been in treatment 3 weeks. The wound is located on the Left,Lateral Lower Leg. The wound measures 4cm length x 1cm width x 0.1cm depth; 3.142cm^2 area and 0.314cm^3 volume. There is Fat Layer (Subcutaneous Tissue) exposed. There is no tunneling or undermining noted. There is a medium amount of serous drainage noted. The wound margin is distinct with the outline attached to the wound base. There is large (67-100%) red, pink granulation within the wound bed. There is no necrotic tissue within the wound bed. Wound #13 status is Healed - Epithelialized. Original cause of wound was Gradually Appeared. The date acquired was: 11/01/2020. The wound has been in treatment 3 weeks. The wound is located on the Right,Proximal,Lateral Lower Leg. The wound measures 0cm length x 0cm width x 0cm depth; 0cm^2 area and 0cm^3 volume. Wound #15 status is Open. Original cause of wound was Gradually Appeared. The date acquired was: 11/08/2020. The wound has been in treatment 2 weeks. The wound is located on the Right,Posterior Lower Leg. The wound measures 4cm length x 2.5cm width x 0.1cm depth; 7.854cm^2 area and 0.785cm^3 volume. There is Fat Layer (Subcutaneous Tissue) exposed. There is no tunneling or undermining noted. There is a medium amount of serosanguineous drainage noted. The wound margin is distinct with the outline attached to the wound base. There is large (67-100%) red granulation within  the wound bed. There is no necrotic tissue within the wound bed. Wound #16 status is Open. Original cause of wound was Gradually Appeared. The date acquired was: 11/08/2020. The wound has been in treatment 2 weeks. The wound is located on the Left,Proximal,Posterior Lower Leg. The wound measures 0.5cm length x 0.5cm width x 0.1cm depth; 0.196cm^2 area and 0.02cm^3 volume. There is Fat Layer (Subcutaneous Tissue) exposed. There is no tunneling or undermining noted. There is a medium amount of serosanguineous drainage noted. The wound margin is distinct with the outline attached  to the wound base. There is large (67-100%) red granulation within the wound bed. There is no necrotic tissue within the wound bed. Wound #17 status is Healed - Epithelialized. Original cause of wound was Gradually Appeared. The date acquired was: 11/08/2020. The wound has been in treatment 2 weeks. The wound is located on the Left,Posterior Lower Leg. The wound measures 0cm length x 0cm width x 0cm depth; 0cm^2 area and 0cm^3 volume. Wound #7R status is Open. Original cause of wound was Blister. The date acquired was: 04/20/2020. The wound has been in treatment 23 weeks. The wound is located on the Left,Anterior Lower Leg. The wound measures 0.3cm length x 0.3cm width x 0.1cm depth; 0.071cm^2 area and 0.007cm^3 volume. There is Fat Layer (Subcutaneous Tissue) exposed. There is no tunneling or undermining noted. There is a medium amount of serosanguineous drainage noted. The wound margin is distinct with the outline attached to the wound base. There is large (67-100%) red, pink granulation within the wound bed. There is no necrotic tissue within the wound bed. Wound #8 status is Healed - Epithelialized. Original cause of wound was Gradually Appeared. The date acquired was: 09/27/2020. The wound has been in treatment 8 weeks. The wound is located on the Right,Lateral Lower Leg. The wound measures 0cm length x 0cm width x 0cm depth;  0cm^2 area and 0cm^3 volume. Wound #9 status is Open. Original cause of wound was Gradually Appeared. The date acquired was: 10/04/2020. The wound has been in treatment 7 weeks. The wound is located on the Left,Distal,Posterior Lower Leg. The wound measures 5.5cm length x 5cm width x 0.1cm depth; 21.598cm^2 area and 2.16cm^3 volume. There is Fat Layer (Subcutaneous Tissue) exposed. There is no tunneling or undermining noted. There is a medium amount of serosanguineous drainage noted. The wound margin is distinct with the outline attached to the wound base. There is large (67-100%) red, pink granulation within the wound bed. There is a small (1-33%) amount of necrotic tissue within the wound bed including Adherent Slough. 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 Although the measurements appear larger in the EMR they have been combined from 2 smaller wounds. She really just has 1 wound remaining to the posterior left leg. She is doing well with silver alginate and we will continue this Under 3 layer compression. No signs of infection on exam. Follow-up in 1 week Plan Follow-up Appointments: Return Appointment in 1 week. - with Dr. Heber Duffield **75 Minutes** Bathing/ Shower/ Hygiene: May shower with protection but do not get wound dressing(s) wet. - Use cast protector Edema Control - Lymphedema / SCD / Other: Elevate legs to the level of the heart or above for 30 minutes daily and/or when sitting, a frequency of: - elevate the legs throughout the day heart level if possible. Avoid standing for long periods of time. Exercise regularly Additional Orders /  Instructions: Follow Nutritious Diet 1. Silver alginate under 3 layer compression 2. Follow-up in 1 week Electronic Signature(s) Signed: 11/22/2020 1:22:05 PM By: Kalman Shan DO Entered By: Kalman Shan on 11/22/2020 13:19:27 -------------------------------------------------------------------------------- HxROS Details Patient Name: Date of Service: [name], [place]. 11/22/2020 11:15 A M Medical Record Number: [id number] Patient Account Number: [id number] Date of Birth/Sex: Treating RN: 1925-12-19 (  87 y.o. [name] Primary Care Provider: [name] Other Clinician: Referring Provider: Treating Provider/Extender: [name] in Treatment: 74 Information Obtained From Patient Eyes Medical History: Negative for: Cataracts; Optic Neuritis Ear/Nose/Mouth/Throat Medical History: Negative for: Chronic sinus problems/congestion; Middle ear problems Hematologic/Lymphatic Medical History: Positive for: Anemia Negative for: Hemophilia; Human Immunodeficiency Virus; Lymphedema; Sickle Cell Disease Respiratory Medical History: Negative for: Aspiration; Asthma; Chronic Obstructive Pulmonary Disease (COPD); Pneumothorax; Sleep Apnea; Tuberculosis Cardiovascular Medical History: Positive for: Arrhythmia - Atrial Flutter, A fibb; Congestive Heart Failure; Hypertension Negative for: Angina; Coronary Artery Disease; Deep Vein Thrombosis; Hypotension; Myocardial Infarction; Peripheral Arterial Disease; Peripheral Venous Disease; Phlebitis; Vasculitis Past Medical History Notes: hyperlipidemia Gastrointestinal Medical History: Positive for: Colitis Negative for: Cirrhosis ; Crohns; Hepatitis A; Hepatitis B; Hepatitis C Endocrine Medical History: Negative for: Type I Diabetes; Type II Diabetes Past Medical History Notes: hypothyroidism Genitourinary Medical History: Negative for: End Stage Renal Disease Immunological Medical  History: Negative for: Lupus Erythematosus; Raynauds; Scleroderma Integumentary (Skin) Medical History: Negative for: History of Burn Musculoskeletal Medical History: Positive for: Osteoarthritis Negative for: Gout; Rheumatoid Arthritis; Osteomyelitis Neurologic Medical History: Negative for: Dementia; Neuropathy; Quadriplegia; Paraplegia; Seizure Disorder Past Medical History Notes: lumbar spindylolysis Oncologic Medical History: Negative for: Received Chemotherapy; Received Radiation Past Medical History Notes: BLE squamous ceel carcionoma Immunizations Pneumococcal Vaccine: Received Pneumococcal Vaccination: Yes Received Pneumococcal Vaccination On or After 60th Birthday: No Implantable Devices None Hospitalization / Surgery History Type of Hospitalization/Surgery removal of rod left leg Family and Social History Unknown History: Yes; Never smoker; Marital Status - Single; Alcohol Use: Never; Drug Use: No History; Caffeine Use: Never; Financial Concerns: No; Food, Clothing or Shelter Needs: No; Support System Lacking: No; Transportation Concerns: No Electronic Signature(s) Signed: 11/22/2020 1:22:05 PM By: Kalman Shan DO Signed: 11/27/2020 4:45:49 PM By: Rhae Hammock RN Entered By: Kalman Shan on 11/22/2020 13:16:30 -------------------------------------------------------------------------------- SuperBill Details Patient Name: Date of Service: [name], [name] 11/22/2020 Medical Record Number: [id number] Patient Account Number: [id number] Date of Birth/Sex: Treating RN: 1925/07/31 (87 y.o. [name], [name] Primary Care Provider: [name] Other Clinician: Referring Provider: Treating Provider/Extender: [name] in Treatment: 23 Diagnosis Coding ICD-10 Codes Code Description C44.92 Squamous cell carcinoma of skin, unspecified L97.819 Non-pressure chronic ulcer of other part of right lower leg with  unspecified severity L97.829 Non-pressure chronic ulcer of other part of left lower leg with unspecified severity I87.2 Venous insufficiency (chronic) (peripheral) Facility Procedures CPT4: Code [id number] Description: 81 BILATERAL: Application of multi-layer venous compression system; leg (below knee), including ankle and t. ICD-10 Diagnosis Description L97.819 Non-pressure chronic ulcer of other part of right lower leg with unspecified severity L97.829  Non-pressure chronic ulcer of other part of left lower leg with unspecified severity Modifier: Quantity: 1 Physician Procedures Electronic Signature(s) Signed: 11/22/2020 6:31:34 PM By: Lorrin Jackson Signed: 11/23/2020 2:24:13 PM By: Kalman Shan DO Previous Signature: 11/22/2020 1:22:05 PM Version By: Kalman Shan DO Entered By: Lorrin Jackson on 11/22/2020 10:17:51

## 2020-11-27 NOTE — Progress Notes (Signed)
[name] ([id number]) , Visit Report for 11/22/2020 Arrival Information Details Patient Name: Date of Service: [name], [name] 11/22/2020 11:15 A M Medical Record Number: [id number] Patient Account Number: [id number] Date of Birth/Sex: Treating RN: 1925-03-05 (87 y.o. [name], [name] Primary Care Volanda Mangine: [name] Other Clinician: Referring Marshelle Bilger: Treating Damarea Merkel/Extender: [name] in Treatment: 23 Visit Information History Since Last Visit Added or deleted any medications: No Patient Arrived: Walker Any new allergies or adverse reactions: No Arrival Time: 11:13 Had a fall or experienced change in No Accompanied By: self activities of daily living that may affect Transfer Assistance: Manual risk of falls: Patient Identification Verified: Yes Signs or symptoms of abuse/neglect since last visito No Secondary Verification Process Completed: Yes Hospitalized since last visit: No Patient Requires Transmission-Based No Implantable device outside of the clinic excluding No Precautions: cellular tissue based products placed in the center Patient Has Alerts: Yes since last visit: Patient Alerts: R ABI = Non compressible Has Dressing in Place as Prescribed: Yes L ABI = Non compressible Pain Present Now: No Electronic Signature(s) Signed: 11/22/2020 6:31:34 PM By: Lorrin Jackson Entered By: Lorrin Jackson on 11/22/2020 12:28:52 -------------------------------------------------------------------------------- Compression Therapy Details Patient Name: Date of Service: [name], [name] 11/22/2020 11:15 A M Medical Record Number: [id number] Patient Account Number: [id number] Date of Birth/Sex: Treating RN: 03-18-1925 (87 y.o. [name] Primary Care Braylen Denunzio: [name] Other Clinician: Referring Bobbyjo Marulanda: Treating Nareh Matzke/Extender: [name] in Treatment: 23 Compression  Therapy Performed for Wound Assessment: Wound #12 Left,Lateral Lower Leg Performed By: Clinician Lorrin Jackson, RN Compression Type: Three Layer Post Procedure Diagnosis Same as Pre-procedure Electronic Signature(s) Signed: 11/22/2020 6:31:34 PM By: Lorrin Jackson Entered By: Lorrin Jackson on 11/22/2020 13:27:59 -------------------------------------------------------------------------------- Compression Therapy Details Patient Name: Date of Service: [name], [name] 11/22/2020 11:15 A M Medical Record Number: [id number] Patient Account Number: [id number] Date of Birth/Sex: Treating RN: 07/29/1925 (87 y.o. [name] Primary Care Hazeline Charnley: [name] Other Clinician: Referring Eoghan Belcher: Treating Kylen Schliep/Extender: [name] in Treatment: 23 Compression Therapy Performed for Wound Assessment: Wound #15 Right,Posterior Lower Leg Performed By: Clinician Lorrin Jackson, RN Compression Type: Three Layer Post Procedure Diagnosis Same as Pre-procedure Electronic Signature(s) Signed: 11/22/2020 6:31:34 PM By: Lorrin Jackson Entered By: Lorrin Jackson on 11/22/2020 13:27:59 -------------------------------------------------------------------------------- Compression Therapy Details Patient Name: Date of Service: [name], [name] 11/22/2020 11:15 A M Medical Record Number: [id number] Patient Account Number: [id number] Date of Birth/Sex: Treating RN: [birth year] (87 y.o. [name] Primary Care Careli Luzader: [name] Other Clinician: Referring Kielyn Kardell: Treating Izekiel Flegel/Extender: [name] in Treatment: 23 Compression Therapy Performed for Wound Assessment: Wound #16 Left,Proximal,Posterior Lower Leg Performed By: Clinician Lorrin Jackson, RN Compression Type: Three Layer Post Procedure Diagnosis Same as Pre-procedure Electronic Signature(s) Signed: 11/22/2020 6:31:34 PM By: Lorrin Jackson Entered By: Lorrin Jackson on 11/22/2020 13:27:59 -------------------------------------------------------------------------------- Compression Therapy Details Patient Name: Date of Service: [name], [name] 11/22/2020 11:15 A M Medical Record Number: [id number] Patient Account Number: [id number] Date of Birth/Sex: Treating RN: August 15, 1925 (87 y.o. [name] Primary Care Nyomi Howser: [name] Other Clinician: Referring Eugene Zeiders: Treating Keeven Matty/Extender: [name] in Treatment: 23 Compression Therapy Performed for Wound Assessment: Wound #7R Left,Anterior Lower Leg Performed By: Clinician Lorrin Jackson, RN Compression Type: Three Layer Post Procedure Diagnosis Same as Pre-procedure Electronic Signature(s) Signed: 11/22/2020 6:31:34 PM By: Lorrin Jackson Signed:  11/22/2020 6:31:34 PM By: Lorrin Jackson Entered By: Lorrin Jackson on 11/22/2020 13:27:59 -------------------------------------------------------------------------------- Compression Therapy Details Patient Name: Date of Service: [name], [name] 11/22/2020 11:15 A M Medical Record Number: [id number] Patient Account Number: [id number] Date of Birth/Sex: Treating RN: Jun 20, 1925 (87 y.o. [name] Primary Care Mikaeel Petrow: [name] Other Clinician: Referring Keithon Mccoin: Treating Revonda Menter/Extender: [name] in Treatment: 23 Compression Therapy Performed for Wound Assessment: Wound #9 Left,Distal,Posterior Lower Leg Performed By: Clinician Lorrin Jackson, RN Compression Type: Three Layer Post Procedure Diagnosis Same as Pre-procedure Electronic Signature(s) Signed: 11/22/2020 6:31:34 PM By: Lorrin Jackson Entered By: Lorrin Jackson on 11/22/2020 13:27:59 -------------------------------------------------------------------------------- Encounter Discharge Information Details Patient Name: Date of Service: [name],  [name] 11/22/2020 11:15 A M Medical Record Number: [id number] Patient Account Number: [id number] Date of Birth/Sex: Treating RN: 04-11-1925 (87 y.o. [name] Primary Care Erastus Bartolomei: [name] Other Clinician: Referring Ashyra Cantin: Treating Jaedin Regina/Extender: [name] in Treatment: 62 Encounter Discharge Information Items Discharge Condition: Stable Ambulatory Status: Walker Discharge Destination: Home Transportation: Other Schedule Follow-up Appointment: Yes Clinical Summary of Care: Provided on 11/22/2020 Form Type Recipient Paper Patient Patient Electronic Signature(s) Signed: 11/22/2020 6:31:34 PM By: Lorrin Jackson Entered By: Lorrin Jackson on 11/22/2020 14:06:20 -------------------------------------------------------------------------------- Lower Extremity Assessment Details Patient Name: Date of Service: [name], Zarya A. 11/22/2020 11:15 A M Medical Record Number: [id number] Patient Account Number: [id number] Date of Birth/Sex: Treating RN: 04/27/1925 (87 y.o. [name], [name] Primary Care Anjeli Casad: [name] Other Clinician: Referring Mirtie Bastyr: Treating Pharell Rolfson/Extender: [name] in Treatment: 23 Edema Assessment Assessed: [Left: Yes] [Right: Yes] Edema: [Left: Yes] [Right: Yes] Calf Left: Right: Point of Measurement: 30 cm From Medial Instep 33 cm 31.5 cm Ankle Left: Right: Point of Measurement: 9 cm From Medial Instep 21.5 cm 22 cm Vascular Assessment Pulses: Dorsalis Pedis Palpable: [Left:Yes] [Right:Yes] Posterior Tibial Palpable: [Left:Yes] [Right:Yes] Electronic Signature(s) Signed: 11/22/2020 6:31:34 PM By: Lorrin Jackson Signed: 11/27/2020 4:45:49 PM By: Rhae Hammock RN Entered By: Lorrin Jackson on 11/22/2020 12:37:42 -------------------------------------------------------------------------------- Multi Wound Chart Details Patient  Name: Date of Service: [name], Lachrista A. 11/22/2020 11:15 A M Medical Record Number: [id number] Patient Account Number: [id number] Date of Birth/Sex: Treating RN: 1925/08/07 (87 y.o. [name], [name] Primary Care Ryan Ogborn: [name] Other Clinician: Referring Rexine Gowens: Treating Loana Salvaggio/Extender: [name] in Treatment: 23 Vital Signs Height(in): 65 Pulse(bpm): 45 Weight(lbs): 163 Blood Pressure(mmHg): 161/87 Body Mass Index(BMI): 27 Temperature(F): 98.5 Respiratory Rate(breaths/min): 18 Photos: [10:No Photos Left, Distal, Medial Lower Leg] [11:No Photos Left, Proximal, Medial Lower Leg] [12:No Photos Left, Lateral Lower Leg] Wound Location: [10:Gradually Appeared] [11:Gradually Appeared] [12:Gradually Appeared] Wounding Event: [10:Venous Leg Ulcer] [11:Venous Leg Ulcer] [12:Venous Leg Ulcer] Primary Etiology: [10:Anemia, Arrhythmia, Congestive Heart Anemia, Arrhythmia, Congestive Heart Anemia, Arrhythmia, Congestive Heart] Comorbid History: [10:Failure, Hypertension, Colitis, Osteoarthritis 10/11/2020] [11:Failure, Hypertension, Colitis, Osteoarthritis 11/01/2020] [12:Failure, Hypertension, Colitis, Osteoarthritis 11/01/2020] Date Acquired: [10:6] [11:3] [12:3] Weeks of Treatment: [10:Healed - Epithelialized] [11:Healed - Epithelialized] [12:Open] Wound Status: [10:No] [11:No] [12:No] Wound Recurrence: [10:No] [11:Yes] [12:Yes] Clustered Wound: [10:N/A] [11:N/A] [12:3] Clustered Quantity: [10:0x0x0] [11:0x0x0] [12:4x1x0.1] Measurements L x W x D (cm) [10:0] [11:0] [12:3.142] A (cm) : rea [10:0] [11:0] [12:0.314] Volume (cm) : [10:100.00%] [11:100.00%] [12:52.90%] % Reduction in Area: [10:100.00%] [11:100.00%] [12:53.00%] % Reduction in Volume: [10:Full Thickness Without Exposed] [11:Full Thickness Without Exposed] [12:Full Thickness Without Exposed] Classification: [10:Support Structures N/A] [11:Support Structures N/A]  [12:Support Structures Medium] Exudate  A mount: [10:N/A] [11:N/A] [12:Serous] Exudate Type: [10:N/A] [11:N/A] [12:amber] Exudate Color: [10:N/A] [11:N/A] [12:Distinct, outline attached] Wound Margin: [10:N/A] [11:N/A] [12:Large (67-100%)] Granulation A mount: [10:N/A] [11:N/A] [12:Red, Pink] Granulation Quality: [10:N/A] [11:N/A] [12:None Present (0%)] Necrotic A mount: [10:N/A] [11:N/A] [12:Medium (34-66%)] Wound Number: [id number]  Photos: No Photos No Photos No Photos Right, Proximal, Lateral Lower Leg Right, Posterior Lower Leg Left, Proximal, Posterior Lower Leg Wound Location: Gradually Appeared Gradually Appeared Gradually Appeared Wounding Event: Venous Leg Ulcer Venous Leg Ulcer Venous Leg Ulcer Primary Etiology: Anemia, Arrhythmia, Congestive Heart Anemia, Arrhythmia, Congestive Heart Anemia, Arrhythmia, Congestive Heart Comorbid History: Failure, Hypertension, Colitis, Failure, Hypertension, Colitis, Failure, Hypertension, Colitis, Osteoarthritis Osteoarthritis Osteoarthritis 11/01/2020 11/08/2020 11/08/2020 Date Acquired: 3 2 2  Weeks of Treatment: Healed - Epithelialized Open Open Wound Status: No No No Wound Recurrence: Yes Yes No Clustered Wound: N/A N/A N/A Clustered Quantity: 0x0x0 4x2.5x0.1 0.5x0.5x0.1 Measurements L x W x D (cm) 0 7.854 0.196 A (cm) : rea 0 0.785 0.02 Volume (cm) : 100.00% -100.00% 68.80% % Reduction in Area: 100.00% -99.70% 68.30% % Reduction in Volume: Full Thickness Without Exposed Full Thickness Without Exposed Full Thickness Without Exposed Classification: Support Structures Support Structures Support Structures N/A Medium Medium Exudate Amount: N/A Serosanguineous Serosanguineous Exudate Type: N/A red, brown red, brown Exudate Color: N/A Distinct, outline attached Distinct, outline attached Wound Margin: N/A Large (67-100%) Large (67-100%) Granulation Amount: N/A Red Red Granulation Quality: N/A None Present (0%) None  Present (0%) Necrotic Amount: N/A Fat Layer (Subcutaneous Tissue): Yes Fat Layer (Subcutaneous Tissue): Yes Exposed Structures: Fascia: No Fascia: No Tendon: No Tendon: No Muscle: No Muscle: No Joint: No Joint: No Bone: No Bone: No N/A None None Epithelialization: Wound Number: [id number] Photos: No Photos No Photos No Photos Left, Posterior Lower Leg Left, Anterior Lower Leg Right, Lateral Lower Leg Wound Location: Gradually Appeared Blister Gradually Appeared Wounding Event: Venous Leg Ulcer Malignant Wound Venous Leg Ulcer Primary Etiology: Anemia, Arrhythmia, Congestive Heart Anemia, Arrhythmia, Congestive Heart Anemia, Arrhythmia, Congestive Heart Comorbid History: Failure, Hypertension, Colitis, Failure, Hypertension, Colitis, Failure, Hypertension, Colitis, Osteoarthritis Osteoarthritis Osteoarthritis 11/08/2020 04/20/2020 09/27/2020 Date Acquired: 2 23 8  Weeks of Treatment: Healed - Epithelialized Open Healed - Epithelialized Wound Status: No Yes No Wound Recurrence: No No No Clustered Wound: N/A N/A N/A Clustered Quantity: 0x0x0 0.3x0.3x0.1 0x0x0 Measurements L x W x D (cm) 0 0.071 0 A (cm) : rea 0 0.007 0 Volume (cm) : 100.00% 97.70% 100.00% % Reduction in Area: 100.00% 97.70% 100.00% % Reduction in Volume: Full Thickness Without Exposed Full Thickness Without Exposed Full Thickness Without Exposed Classification: Support Structures Support Structures Support Structures N/A Medium N/A Exudate Amount: N/A Serosanguineous N/A Exudate Type: N/A red, brown N/A Exudate Color: N/A Distinct, outline attached N/A Wound Margin: N/A Large (67-100%) N/A Granulation Amount: N/A Red, Pink N/A Granulation Quality: N/A None Present (0%) N/A Necrotic Amount: N/A Fat Layer (Subcutaneous Tissue): Yes N/A Exposed Structures: Fascia: No Tendon: No Muscle: No Joint: No Bone: No N/A Medium (34-66%) N/A Epithelialization: Wound Number: 9 N/A N/A Photos: No  Photos N/A N/A Left, Distal, Posterior Lower Leg N/A N/A Wound Location: Gradually Appeared N/A N/A Wounding Event: Venous Leg Ulcer N/A N/A Primary Etiology: Anemia, Arrhythmia, Congestive Heart N/A N/A Comorbid History: Failure, Hypertension, Colitis, Osteoarthritis 10/04/2020 N/A N/A Date Acquired: 7 N/A N/A Weeks of Treatment: Open N/A N/A Wound Status: No N/A N/A Wound Recurrence: No N/A N/A Clustered Wound: N/A N/A N/A Clustered Quantity: 5.5x5x0.1 N/A N/A Measurements L x W  x D (cm) 21.598 N/A N/A A (cm) : rea 2.16 N/A N/A Volume (cm) : -11388.30% N/A N/A % Reduction in Area: -11268.40% N/A N/A % Reduction in Volume: Full Thickness Without Exposed N/A N/A Classification: Support Structures Medium N/A N/A Exudate Amount: Serosanguineous N/A N/A Exudate Type: red, brown N/A N/A Exudate Color: Distinct, outline attached N/A N/A Wound Margin: Large (67-100%) N/A N/A Granulation Amount: Red, Pink N/A N/A Granulation Quality: Small (1-33%) N/A N/A Necrotic Amount: Fat Layer (Subcutaneous Tissue): Yes N/A N/A Exposed Structures: Fascia: No Tendon: No Muscle: No Joint: No Bone: No Medium (34-66%) N/A N/A Epithelialization: Treatment Notes Electronic Signature(s) Signed: 11/22/2020 1:22:05 PM By: Kalman Shan DO Signed: 11/27/2020 4:45:49 PM By: Rhae Hammock RN Entered By: Kalman Shan on 11/22/2020 13:15:42 -------------------------------------------------------------------------------- Multi-Disciplinary Care Plan Details Patient Name: Date of Service: [name], Adeleigh A. 11/22/2020 11:15 A M Medical Record Number: [id number] Patient Account Number: [id number] Date of Birth/Sex: Treating RN: 1925/11/18 (87 y.o. [name] Primary Care Rajesh Wyss: [name] Other Clinician: Referring Willean Schurman: Treating Joshua Zeringue/Extender: [name] in Treatment: 37 Multidisciplinary Care Plan  reviewed with physician Active Inactive Venous Leg Ulcer Nursing Diagnoses: Knowledge deficit related to disease process and management Potential for venous Insuffiency (use before diagnosis confirmed) Goals: Patient will maintain optimal edema control Date Initiated: 11/01/2020 Target Resolution Date: 11/29/2020 Goal Status: Active Interventions: Assess peripheral edema status every visit. Compression as ordered Provide education on venous insufficiency Treatment Activities: Therapeutic compression applied : 11/01/2020 Notes: Wound/Skin Impairment Nursing Diagnoses: Impaired tissue integrity Knowledge deficit related to ulceration/compromised skin integrity Goals: Patient/caregiver will verbalize understanding of skin care regimen Date Initiated: 06/11/2020 Target Resolution Date: 11/29/2020 Goal Status: Active Interventions: Assess patient/caregiver ability to obtain necessary supplies Assess patient/caregiver ability to perform ulcer/skin care regimen upon admission and as needed Assess ulceration(s) every visit Provide education on ulcer and skin care Notes: 10/11/20: Wound care regimen ongoing. Electronic Signature(s) Signed: 11/22/2020 6:31:34 PM By: Lorrin Jackson Entered By: Lorrin Jackson on 11/22/2020 12:26:03 -------------------------------------------------------------------------------- Pain Assessment Details Patient Name: Date of Service: [name], Quinette A. 11/22/2020 11:15 A M Medical Record Number: [id number] Patient Account Number: [id number] Date of Birth/Sex: Treating RN: 1925/07/02 (87 y.o. [name], [name] Primary Care Yusef Lamp: [name] Other Clinician: Referring Camay Pedigo: Treating Maryn Freelove/Extender: [name] in Treatment: 23 Active Problems Location of Pain Severity and Description of Pain Patient Has Paino No Site Locations Pain Management and Medication Current Pain Management: Electronic  Signature(s) Signed: 11/27/2020 4:45:49 PM By: Rhae Hammock RN Entered By: Rhae Hammock on 11/22/2020 11:14:51 -------------------------------------------------------------------------------- Patient/Caregiver Education Details Patient Name: Date of Service: [name], Jalaine A. 11/3/2022andnbsp11:15 A M Medical Record Number: [id number] Patient Account Number: [id number] Date of Birth/Gender: Treating RN: Jul 28, 1925 (87 y.o. [name] Primary Care Physician: [name] Other Clinician: Referring Physician: Treating Physician/Extender: [name] in Treatment: 63 Education Assessment Education Provided To: Patient Education Topics Provided Venous: Methods: Explain/Verbal, Printed Responses: State content correctly Wound/Skin Impairment: Methods: Explain/Verbal, Printed Responses: State content correctly Electronic Signature(s) Signed: 11/22/2020 6:31:34 PM By: Lorrin Jackson Entered By: Lorrin Jackson on 11/22/2020 12:26:29 -------------------------------------------------------------------------------- Wound Assessment Details Patient Name: Date of Service: [name], Fusaye A. 11/22/2020 11:15 A M Medical Record Number: [id number] Patient Account Number: [id number] Date of Birth/Sex: Treating RN: 1925/06/20 (87 y.o. [name] Primary Care Kathya Wilz: [name] Other Clinician: Referring Garlon Tuggle: Treating Loghan Kurtzman/Extender: [name] in Treatment: 23 Wound  Status Wound Number: 10 Primary Venous Leg Ulcer Etiology: Wound Location: Left, Distal, Medial Lower Leg Wound Healed - Epithelialized Wounding Event: Gradually Appeared Status: Date Acquired: 10/11/2020 Comorbid Anemia, Arrhythmia, Congestive Heart Failure, Hypertension, Weeks Of Treatment: 6 History: Colitis, Osteoarthritis Clustered Wound: No Wound Measurements Length: (cm) Width: (cm) Depth:  (cm) Area: (cm) Volume: (cm) 0 % Reduction in Area: 100% 0 % Reduction in Volume: 100% 0 0 0 Wound Description Classification: Full Thickness Without Exposed Support Structur es Electronic Signature(s) Signed: 11/22/2020 6:31:34 PM By: Lorrin Jackson Entered By: Lorrin Jackson on 11/22/2020 12:45:35 -------------------------------------------------------------------------------- Wound Assessment Details Patient Name: Date of Service: [name], Daliyah A. 11/22/2020 11:15 A M Medical Record Number: [id number] Patient Account Number: [id number] Date of Birth/Sex: Treating RN: 02/24/1925 (87 y.o. [name] Primary Care Jasnoor Trussell: [name] Other Clinician: Referring Tatem Fesler: Treating Lavina Resor/Extender: [name] in Treatment: 23 Wound Status Wound Number: 11 Primary Venous Leg Ulcer Etiology: Wound Location: Left, Proximal, Medial Lower Leg Wound Healed - Epithelialized Wounding Event: Gradually Appeared Status: Date Acquired: 11/01/2020 Comorbid Anemia, Arrhythmia, Congestive Heart Failure, Hypertension, Weeks Of Treatment: 3 History: Colitis, Osteoarthritis Clustered Wound: Yes Wound Measurements Length: (cm) Width: (cm) Depth: (cm) Area: (cm) Volume: (cm) 0 % Reduction in Area: 100% 0 % Reduction in Volume: 100% 0 0 0 Wound Description Classification: Full Thickness Without Exposed Support Structur es Electronic Signature(s) Signed: 11/22/2020 6:31:34 PM By: Lorrin Jackson Entered By: Lorrin Jackson on 11/22/2020 12:46:26 -------------------------------------------------------------------------------- Wound Assessment Details Patient Name: Date of Service: [name], Glenys A. 11/22/2020 11:15 A M Medical Record Number: [id number] Patient Account Number: [id number] Date of Birth/Sex: Treating RN: August 28, 1925 (87 y.o. [name] Primary Care Malayna Noori: [name] Other Clinician: Referring  Martavious Hartel: Treating Debralee Braaksma/Extender: [name] in Treatment: 23 Wound Status Wound Number: 12 Primary Venous Leg Ulcer Etiology: Wound Location: Left, Lateral Lower Leg Wound Open Wounding Event: Gradually Appeared Status: Date Acquired: 11/01/2020 Comorbid Anemia, Arrhythmia, Congestive Heart Failure, Hypertension, Weeks Of Treatment: 3 History: Colitis, Osteoarthritis Clustered Wound: Yes Wound Measurements Length: (cm) 4 Width: (cm) 1 Depth: (cm) 0 Clustered Quantity: 3 Area: (cm) Volume: (cm) % Reduction in Area: 52.9% % Reduction in Volume: 53% .1 Epithelialization: Medium (34-66%) Tunneling: No 3.142 Undermining: No 0.314 Wound Description Classification: Full Thickness Without Exposed Support Structures Wound Margin: Distinct, outline attached Exudate Amount: Medium Exudate Type: Serous Exudate Color: amber Foul Odor After Cleansing: No Slough/Fibrino No Wound Bed Granulation Amount: Large (67-100%) Exposed Structure Granulation Quality: Red, Pink Fascia Exposed: No Necrotic Amount: None Present (0%) Fat Layer (Subcutaneous Tissue) Exposed: Yes Tendon Exposed: No Muscle Exposed: No Joint Exposed: No Bone Exposed: No Treatment Notes Wound #12 (Lower Leg) Wound Laterality: Left, Lateral Cleanser Soap and Water Discharge Instruction: May shower and wash wound with dial antibacterial soap and water prior to dressing change. Wound Cleanser Discharge Instruction: Cleanse the wound with wound cleanser prior to applying a clean dressing using gauze sponges, not tissue or cotton balls. Peri-Wound Care Triamcinolone 15 (g) Discharge Instruction: In clinic only. Use triamcinolone 15 (g) in clinic only. Sween Lotion (Moisturizing lotion) Discharge Instruction: Apply moisturizing lotion as directed Topical Primary Dressing KerraCel Ag Gelling Fiber Dressing, 4x5 in (silver alginate) Discharge Instruction: Apply silver  alginate to wound bed as instructed Secondary Dressing Woven Gauze Sponge, Non-Sterile 4x4 in Discharge Instruction: Apply over primary dressing as directed. ABD Pad, 8x10 Discharge Instruction: Apply over primary dressing as directed. Secured With Compression Wrap ThreePress (  3 layer compression wrap) Discharge Instruction: Apply three layer compression as directed. Compression Stockings Add-Ons Electronic Signature(s) Signed: 11/22/2020 6:31:34 PM By: Lorrin Jackson Entered By: Lorrin Jackson on 11/22/2020 12:44:59 -------------------------------------------------------------------------------- Wound Assessment Details Patient Name: Date of Service: [name], Sanyia A. 11/22/2020 11:15 A M Medical Record Number: [id number] Patient Account Number: [id number] Date of Birth/Sex: Treating RN: [birth year] (87 y.o. [name] Primary Care Deion Forgue: [name] Other Clinician: Referring Aldine Chakraborty: Treating Shantia Sanford/Extender: [name] in Treatment: 23 Wound Status Wound Number: 20 Primary Venous Leg Ulcer Etiology: Wound Location: Right, Proximal, Lateral Lower Leg Wound Healed - Epithelialized Wounding Event: Gradually Appeared Status: Date Acquired: 11/01/2020 Comorbid Anemia, Arrhythmia, Congestive Heart Failure, Hypertension, Weeks Of Treatment: 3 History: Colitis, Osteoarthritis Clustered Wound: Yes Wound Measurements Length: (cm) Width: (cm) Depth: (cm) Area: (cm) Volume: (cm) 0 % Reduction in Area: 100% 0 % Reduction in Volume: 100% 0 0 0 Wound Description Classification: Full Thickness Without Exposed Support Structur es Electronic Signature(s) Signed: 11/22/2020 6:31:34 PM By: Lorrin Jackson Entered By: Lorrin Jackson on 11/22/2020 12:46:54 -------------------------------------------------------------------------------- Wound Assessment Details Patient Name: Date of Service: [name], Cheyeanne A. 11/22/2020 11:15  A M Medical Record Number: [id number] Patient Account Number: [id number] Date of Birth/Sex: Treating RN: [birth year] (87 y.o. [name] Primary Care Dink Creps: [name] Other Clinician: Referring TRUE Shackleford: Treating Elhadj Girton/Extender: [name] in Treatment: 23 Wound Status Wound Number: 15 Primary Venous Leg Ulcer Etiology: Wound Location: Right, Posterior Lower Leg Wound Open Wounding Event: Gradually Appeared Status: Date Acquired: 11/08/2020 Comorbid Anemia, Arrhythmia, Congestive Heart Failure, Hypertension, Weeks Of Treatment: 2 History: Colitis, Osteoarthritis Clustered Wound: Yes Wound Measurements Length: (cm) 4 Width: (cm) 2.5 Depth: (cm) 0.1 Area: (cm) 7.854 Volume: (cm) 0.785 % Reduction in Area: -100% % Reduction in Volume: -99.7% Epithelialization: None Tunneling: No Undermining: No Wound Description Classification: Full Thickness Without Exposed Support Structures Wound Margin: Distinct, outline attached Exudate Amount: Medium Exudate Type: Serosanguineous Exudate Color: red, brown Foul Odor After Cleansing: No Slough/Fibrino No Wound Bed Granulation Amount: Large (67-100%) Exposed Structure Granulation Quality: Red Fascia Exposed: No Necrotic Amount: None Present (0%) Fat Layer (Subcutaneous Tissue) Exposed: Yes Tendon Exposed: No Muscle Exposed: No Joint Exposed: No Bone Exposed: No Treatment Notes Wound #15 (Lower Leg) Wound Laterality: Right, Posterior Cleanser Soap and Water Discharge Instruction: May shower and wash wound with dial antibacterial soap and water prior to dressing change. Wound Cleanser Discharge Instruction: Cleanse the wound with wound cleanser prior to applying a clean dressing using gauze sponges, not tissue or cotton balls. Peri-Wound Care Triamcinolone 15 (g) Discharge Instruction: In clinic only. Use triamcinolone 15 (g) in clinic only. Sween Lotion (Moisturizing  lotion) Discharge Instruction: Apply moisturizing lotion as directed Topical Primary Dressing KerraCel Ag Gelling Fiber Dressing, 4x5 in (silver alginate) Discharge Instruction: Apply silver alginate to wound bed as instructed Secondary Dressing Woven Gauze Sponge, Non-Sterile 4x4 in Discharge Instruction: Apply over primary dressing as directed. ABD Pad, 8x10 Discharge Instruction: Apply over primary dressing as directed. Secured With Compression Wrap ThreePress (3 layer compression wrap) Discharge Instruction: Apply three layer compression as directed. Compression Stockings Add-Ons Electronic Signature(s) Signed: 11/22/2020 6:31:34 PM By: Lorrin Jackson Entered By: Lorrin Jackson on 11/22/2020 12:47:37 -------------------------------------------------------------------------------- Wound Assessment Details Patient Name: Date of Service: [name], Ronneisha A. 11/22/2020 11:15 A M Medical Record Number: [id number] Patient Account Number: [id number] Date of Birth/Sex: Treating RN: 1925/06/29 (87 y.o. [name] Primary Care Sherby Moncayo: [name]  Other Clinician: Referring Ravenne Wayment: Treating Maliq Pilley/Extender: [name] in Treatment: 23 Wound Status Wound Number: 16 Primary Venous Leg Ulcer Etiology: Wound Location: Left, Proximal, Posterior Lower Leg Wound Open Wounding Event: Gradually Appeared Status: Date Acquired: 11/08/2020 Comorbid Anemia, Arrhythmia, Congestive Heart Failure, Hypertension, Weeks Of Treatment: 2 History: Colitis, Osteoarthritis Clustered Wound: No Wound Measurements Length: (cm) 0.5 Width: (cm) 0.5 Depth: (cm) 0.1 Area: (cm) 0.196 Volume: (cm) 0.02 % Reduction in Area: 68.8% % Reduction in Volume: 68.3% Epithelialization: None Tunneling: No Undermining: No Wound Description Classification: Full Thickness Without Exposed Support Structures Wound Margin: Distinct, outline attached Exudate  Amount: Medium Exudate Type: Serosanguineous Exudate Color: red, brown Foul Odor After Cleansing: No Slough/Fibrino No Wound Bed Granulation Amount: Large (67-100%) Exposed Structure Granulation Quality: Red Fascia Exposed: No Necrotic Amount: None Present (0%) Fat Layer (Subcutaneous Tissue) Exposed: Yes Tendon Exposed: No Muscle Exposed: No Joint Exposed: No Bone Exposed: No Treatment Notes Wound #16 (Lower Leg) Wound Laterality: Left, Posterior, Proximal Cleanser Soap and Water Discharge Instruction: May shower and wash wound with dial antibacterial soap and water prior to dressing change. Wound Cleanser Discharge Instruction: Cleanse the wound with wound cleanser prior to applying a clean dressing using gauze sponges, not tissue or cotton balls. Peri-Wound Care Triamcinolone 15 (g) Discharge Instruction: In clinic only. Use triamcinolone 15 (g) in clinic only. Sween Lotion (Moisturizing lotion) Discharge Instruction: Apply moisturizing lotion as directed Topical Primary Dressing KerraCel Ag Gelling Fiber Dressing, 4x5 in (silver alginate) Discharge Instruction: Apply silver alginate to wound bed as instructed Secondary Dressing Woven Gauze Sponge, Non-Sterile 4x4 in Discharge Instruction: Apply over primary dressing as directed. ABD Pad, 8x10 Discharge Instruction: Apply over primary dressing as directed. Secured With Compression Wrap ThreePress (3 layer compression wrap) Discharge Instruction: Apply three layer compression as directed. Compression Stockings Add-Ons Electronic Signature(s) Signed: 11/22/2020 6:31:34 PM By: Lorrin Jackson Entered By: Lorrin Jackson on 11/22/2020 12:41:05 -------------------------------------------------------------------------------- Wound Assessment Details Patient Name: Date of Service: [name], Laquitta A. 11/22/2020 11:15 A M Medical Record Number: [id number] Patient Account Number: [id number] Date of Birth/Sex: Treating  RN: Mar 22, 1925 (87 y.o. [name] Primary Care Jamison Yuhasz: [name] Other Clinician: Referring Coy Vandoren: Treating Joscelynn Brutus/Extender: [name] in Treatment: 23 Wound Status Wound Number: 17 Primary Venous Leg Ulcer Etiology: Wound Location: Left, Posterior Lower Leg Wound Healed - Epithelialized Wounding Event: Gradually Appeared Status: Date Acquired: 11/08/2020 Comorbid Anemia, Arrhythmia, Congestive Heart Failure, Hypertension, Weeks Of Treatment: 2 History: Colitis, Osteoarthritis Clustered Wound: No Wound Measurements Length: (cm) Width: (cm) Depth: (cm) Area: (cm) Volume: (cm) 0 % Reduction in Area: 100% 0 % Reduction in Volume: 100% 0 0 0 Wound Description Classification: Full Thickness Without Exposed Support Structur es Electronic Signature(s) Signed: 11/22/2020 6:31:34 PM By: Lorrin Jackson Entered By: Lorrin Jackson on 11/22/2020 12:45:55 -------------------------------------------------------------------------------- Wound Assessment Details Patient Name: Date of Service: [name], Eavan A. 11/22/2020 11:15 A M Medical Record Number: [id number] Patient Account Number: [id number] Date of Birth/Sex: Treating RN: [birth year] (87 y.o. [name] Primary Care Aijalon Kirtz: [name] Other Clinician: Referring Zurri Rudden: Treating Alvey Brockel/Extender: [name] in Treatment: 23 Wound Status Wound Number: 7R Primary Malignant Wound Etiology: Wound Location: Left, Anterior Lower Leg Wound Open Wounding Event: Blister Status: Date Acquired: 04/20/2020 Comorbid Anemia, Arrhythmia, Congestive Heart Failure, Hypertension, Weeks Of Treatment: 23 History: Colitis, Osteoarthritis Clustered Wound: No Wound Measurements Length: (cm) 0.3 Width: (cm) 0.3 Depth: (cm) 0.1 Area: (cm) 0.071 Volume: (cm)  0.007 % Reduction in Area: 97.7% % Reduction in Volume:  97.7% Epithelialization: Medium (34-66%) Tunneling: No Undermining: No Wound Description Classification: Full Thickness Without Exposed Support Structures Wound Margin: Distinct, outline attached Exudate Amount: Medium Exudate Type: Serosanguineous Exudate Color: red, brown Foul Odor After Cleansing: No Slough/Fibrino No Wound Bed Granulation Amount: Large (67-100%) Exposed Structure Granulation Quality: Red, Pink Fascia Exposed: No Necrotic Amount: None Present (0%) Fat Layer (Subcutaneous Tissue) Exposed: Yes Tendon Exposed: No Muscle Exposed: No Joint Exposed: No Bone Exposed: No Treatment Notes Wound #7R (Lower Leg) Wound Laterality: Left, Anterior Cleanser Soap and Water Discharge Instruction: May shower and wash wound with dial antibacterial soap and water prior to dressing change. Wound Cleanser Discharge Instruction: Cleanse the wound with wound cleanser prior to applying a clean dressing using gauze sponges, not tissue or cotton balls. Peri-Wound Care Triamcinolone 15 (g) Discharge Instruction: In clinic only. Use triamcinolone 15 (g) in clinic only. Sween Lotion (Moisturizing lotion) Discharge Instruction: Apply moisturizing lotion as directed Topical Primary Dressing KerraCel Ag Gelling Fiber Dressing, 4x5 in (silver alginate) Discharge Instruction: Apply silver alginate to wound bed as instructed Secondary Dressing Woven Gauze Sponge, Non-Sterile 4x4 in Discharge Instruction: Apply over primary dressing as directed. ABD Pad, 8x10 Discharge Instruction: Apply over primary dressing as directed. Secured With Compression Wrap ThreePress (3 layer compression wrap) Discharge Instruction: Apply three layer compression as directed. Compression Stockings Add-Ons Electronic Signature(s) Signed: 11/22/2020 6:31:34 PM By: Lorrin Jackson Entered By: Lorrin Jackson on 11/22/2020  12:43:50 -------------------------------------------------------------------------------- Wound Assessment Details Patient Name: Date of Service: [name], Amirra A. 11/22/2020 11:15 A M Medical Record Number: [id number] Patient Account Number: [id number] Date of Birth/Sex: Treating RN: 1925/04/15 (87 y.o. [name] Primary Care Shantavia Jha: [name] Other Clinician: Referring Steffan Caniglia: Treating Kali Ambler/Extender: [name] in Treatment: 23 Wound Status Wound Number: 8 Primary Venous Leg Ulcer Etiology: Wound Location: Right, Lateral Lower Leg Wound Healed - Epithelialized Wounding Event: Gradually Appeared Status: Date Acquired: 09/27/2020 Comorbid Anemia, Arrhythmia, Congestive Heart Failure, Hypertension, Weeks Of Treatment: 8 History: Colitis, Osteoarthritis Clustered Wound: No Wound Measurements Length: (cm) Width: (cm) Depth: (cm) Area: (cm) Volume: (cm) 0 % Reduction in Area: 100% 0 % Reduction in Volume: 100% 0 0 0 Wound Description Classification: Full Thickness Without Exposed Support Structur es Electronic Signature(s) Signed: 11/22/2020 6:31:34 PM By: Lorrin Jackson Entered By: Lorrin Jackson on 11/22/2020 12:48:11 -------------------------------------------------------------------------------- Wound Assessment Details Patient Name: Date of Service: [name], Sofija A. 11/22/2020 11:15 A M Medical Record Number: [id number] Patient Account Number: [id number] Date of Birth/Sex: Treating RN: 1925/10/10 (87 y.o. [name] Primary Care Deisy Ozbun: [name] Other Clinician: Referring Sequoia Witz: Treating Tremon Sainvil/Extender: [name] in Treatment: 23 Wound Status Wound Number: 9 Primary Venous Leg Ulcer Etiology: Wound Location: Left, Distal, Posterior Lower Leg Wound Open Wounding Event: Gradually Appeared Status: Date Acquired: 10/04/2020 Comorbid Anemia,  Arrhythmia, Congestive Heart Failure, Hypertension, Weeks Of Treatment: 7 History: Colitis, Osteoarthritis Clustered Wound: No Wound Measurements Length: (cm) 5.5 Width: (cm) 5 Depth: (cm) 0.1 Area: (cm) 21.598 Volume: (cm) 2.16 % Reduction in Area: -11388.3% % Reduction in Volume: -11268.4% Epithelialization: Medium (34-66%) Tunneling: No Undermining: No Wound Description Classification: Full Thickness Without Exposed Support Structures Wound Margin: Distinct, outline attached Exudate Amount: Medium Exudate Type: Serosanguineous Exudate Color: red, brown Foul Odor After Cleansing: No Slough/Fibrino Yes Wound Bed Granulation Amount: Large (67-100%) Exposed Structure Granulation Quality: Red, Pink Fascia Exposed: No Necrotic Amount: Small (1-33%) Fat Layer (  Subcutaneous Tissue) Exposed: Yes Necrotic Quality: Adherent Slough Tendon Exposed: No Muscle Exposed: No Joint Exposed: No Bone Exposed: No Treatment Notes Wound #9 (Lower Leg) Wound Laterality: Left, Posterior, Distal Cleanser Soap and Water Discharge Instruction: May shower and wash wound with dial antibacterial soap and water prior to dressing change. Wound Cleanser Discharge Instruction: Cleanse the wound with wound cleanser prior to applying a clean dressing using gauze sponges, not tissue or cotton balls. Peri-Wound Care Triamcinolone 15 (g) Discharge Instruction: In clinic only. Use triamcinolone 15 (g) in clinic only. Sween Lotion (Moisturizing lotion) Discharge Instruction: Apply moisturizing lotion as directed Topical Primary Dressing KerraCel Ag Gelling Fiber Dressing, 4x5 in (silver alginate) Discharge Instruction: Apply silver alginate to wound bed as instructed Secondary Dressing Woven Gauze Sponge, Non-Sterile 4x4 in Discharge Instruction: Apply over primary dressing as directed. ABD Pad, 8x10 Discharge Instruction: Apply over primary dressing as directed. Secured With Compression  Wrap ThreePress (3 layer compression wrap) Discharge Instruction: Apply three layer compression as directed. Compression Stockings Add-Ons Electronic Signature(s) Signed: 11/22/2020 6:31:34 PM By: Lorrin Jackson Entered By: Lorrin Jackson on 11/22/2020 12:42:02 -------------------------------------------------------------------------------- Vitals Details Patient Name: Date of Service: [name], Zamora A. 11/22/2020 11:15 A M Medical Record Number: [id number] Patient Account Number: [id number] Date of Birth/Sex: Treating RN: [birth year] (87 y.o. [name] Primary Care Aviyana Sonntag: [name] Other Clinician: Referring Evelisse Szalkowski: Treating Sade Hollon/Extender: [name] in Treatment: 23 Vital Signs Time Taken: 12:28 Temperature (F): 98.5 Height (in): 65 Pulse (bpm): 82 Weight (lbs): 163 Respiratory Rate (breaths/min): 18 Body Mass Index (BMI): 27.1 Blood Pressure (mmHg): 161/87 Reference Range: 80 - 120 mg / dl Electronic Signature(s) Signed: 11/22/2020 6:31:34 PM By: Lorrin Jackson Entered By: Lorrin Jackson on 11/22/2020 12:30:28

## 2020-11-27 NOTE — Telephone Encounter (Signed)
Called pt there was no answer LMOM MD sent rx to [facility].Marland KitchenJohny Chess

## 2020-11-28 ENCOUNTER — Ambulatory Visit: Payer: Medicare Other

## 2020-11-28 ENCOUNTER — Encounter: Payer: Medicare Other | Admitting: Dietician

## 2020-11-28 ENCOUNTER — Other Ambulatory Visit: Payer: Medicare Other

## 2020-11-29 ENCOUNTER — Telehealth: Payer: Self-pay | Admitting: Oncology

## 2020-11-29 ENCOUNTER — Other Ambulatory Visit: Payer: Self-pay

## 2020-11-29 ENCOUNTER — Encounter (HOSPITAL_BASED_OUTPATIENT_CLINIC_OR_DEPARTMENT_OTHER): Payer: Medicare Other | Admitting: Internal Medicine

## 2020-11-29 DIAGNOSIS — L97222 Non-pressure chronic ulcer of left calf with fat layer exposed: Secondary | ICD-10-CM | POA: Diagnosis not present

## 2020-11-29 DIAGNOSIS — L97829 Non-pressure chronic ulcer of other part of left lower leg with unspecified severity: Secondary | ICD-10-CM | POA: Diagnosis not present

## 2020-11-29 DIAGNOSIS — I872 Venous insufficiency (chronic) (peripheral): Secondary | ICD-10-CM | POA: Diagnosis not present

## 2020-11-29 DIAGNOSIS — L97819 Non-pressure chronic ulcer of other part of right lower leg with unspecified severity: Secondary | ICD-10-CM | POA: Diagnosis not present

## 2020-11-29 DIAGNOSIS — Z85828 Personal history of other malignant neoplasm of skin: Secondary | ICD-10-CM | POA: Diagnosis not present

## 2020-11-29 DIAGNOSIS — L97212 Non-pressure chronic ulcer of right calf with fat layer exposed: Secondary | ICD-10-CM | POA: Diagnosis not present

## 2020-11-29 NOTE — Progress Notes (Signed)
[name] ([id number]) , Visit Report for 11/29/2020 HPI Details Patient Name: Date of Service: [name], [place]. 11/29/2020 2:00 PM Medical Record Number: [id number] Patient Account Number: [id number] Date of Birth/Sex: Treating RN: [birth year] (87 y.o. 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 She states that the wound on the right has been present for a year. The 1 on the left has opened up 1 month ago. She is followed with oncology for this issue as she had biopsies that showed squamous cell carcinoma. She is also seeing radiation oncology for treatment options. She presents today because she would like for her wounds to be healed by [place]. She currently denies signs of infection. 6/1; patient presents for 1 week follow-up. She states she has tolerated the leg wraps well. She states these do not bother her and is happy to continue with them. She is scheduled to see her oncologist today to go over treatment options for the bilateral lower extremity squamous cell carcinoma. Radiation is currently not a recommended option. Patient states she overall feels well. 6/22; patient presents for 3-week follow-up. She has tolerated the wraps well until her last wrap where she states they were uncomfortable. She attributes this to the home health nurse. She denies signs of infection. She has started her first treatment of antibody infusions for her Bilateral lower extremity squamous cell carcinoma. She has  no complaints today. 7/21; patient presents for 1 month follow-up. Unfortunately she has not had good experience with her wrap changes with home health. She would like to do her own dressing changes. She continues to do her antibody infusions. She denies signs of infection. 7/28; patient presents for 1 week follow-up. At last clinic visit she was switched to daily dressing changes due to issues with the wrap and home health placing them. Unfortunately she has developed weeping to her legs bilaterally. She would like to be placed in wraps today. She would also like to follow with [place] weekly for wrap changes instead of having home health change them. She denies signs of infection. 8/4; patient presents for 1 week follow-up. She has tolerated the Kerlix/Coban wraps well. She no longer has weeping to her legs. She took the wrap off 1 day before coming in to be able to take a shower. She has no issues or complaints today. She denies signs of infection. 8/18; patient presents for follow-up. Patient has tolerated the wraps well. She brought her Velcro compression wraps today. She has no issues or complaints today. She had her chemotherapy infusion yesterday without issues. She denies signs of infection. 8/25; patient presents for follow-up. She used her juxta lite compressions for the past week. It is unclear if she is able to put these on correctly since she states she has a hard time getting them to look right. She reports 2 open wounds. She currently denies signs of infection. 9/1; patient presents for 1 week follow-up. She has 1 open wound. She tolerated the compression wraps well. She currently denies signs of infection. 9/8; patient presents for 1 week follow-up. She has 2 open wounds  1 on each leg. She has tolerated the compression wrap well. She currently denies signs of infection. 9/15; patient presents for 1 week follow-up. She now has 3 wounds. 2 on the left and 1 on the right. She continues to  tolerate the compression wrap well. She currently denies signs of infection. She has obtained furosemide by her primary care physician and would like to discuss when to take this. 9/22; patient presents for 1 week follow-up. She has scattered wounds on her lower extremities bilaterally. She did take furosemide twice in the past week. She does not recall having to urinate more frequently. She tolerated the 3 layer compression well. She denies signs of infection. 10/13; patient has not been here recently because of [place]. Apparently the facility was only putting gauze on her legs. This is a patient I do not normally see. 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 Ronnald Ramp at [facility] dermatology. She is quite convincing that she did not have radiation to her lower legs. It is likely she also has significant chronic venous insufficiency stasis dermatitis. We have been using silver alginate under kerlix Coban. She has obvious open areas medially on the right and areas on the left. She also has areas of extensive dry flaking adherent areas on the right and to a lesser extent on the left anterior. Nodular areas on the left lateral lower leg left posterior calf and right mid calf medially. 10/21; patient presents for follow-up. She has no issues or complaints today. She has tolerated the 3 layer compression well bilaterally. She denies signs of infection. 10/27; patient presents for follow-up. She continues to tolerate the 3 layer compression wrap well. She denies signs of infection. 11/30; patient presents for follow-up. She has no issues or complaints today. 11/10; patient arrived in clinic today for nurse visit accompanied by her daughter from [place]. The daughter had multiple questions so we turned this into doctors visit. Apparently after the last time I saw this woman in October she went to see Dr. Ronnald Ramp but he did not take the  wraps off. She previously was treated with Cemiplimab for her squamous cell carcinoma. She did not receive radiation to her legs. I had wanted Dr. Ronnald Ramp to look at this because of the exceptionally damaged skin on her lower legs bilaterally. She has odd looking wounds on the left medial lower leg also extending posteriorly which we have not made a lot of progress on. But she also has a raised hyperkeratotic nodules on the right anterior tibial area plaques on the left medial lower thigh. These are not areas that are under compression. We have been using silver alginate on any open areas Liberal TCA under 3 layer compression. Electronic Signature(s) Signed: 11/29/2020 5:10:51 PM By: Linton Ham MD Entered By: Linton Ham on 11/29/2020 16:59:25 -------------------------------------------------------------------------------- Physical Exam Details Patient Name: Date of Service: [name], [place]. 11/29/2020 2:00 PM Medical Record Number: [id number] Patient Account Number: [id number] Date of Birth/Sex: Treating RN: 1925/11/05 (87 y.o. Elam Dutch Primary Care Provider: [name] Other Clinician: Referring Provider: Treating Provider/Extender: Thayer Headings in Treatment: 24 Constitutional Patient is hypertensive.. Pulse regular and within target range for patient.[name] Respirations regular, non-labored and within target range.. Temperature is normal and within the target range for the patient.[name] Appears in no distress. Cardiovascular Edema present in both extremities. This is mild. Some degree of chronic venous insufficiency.  Integumentary (Hair, Skin) Severely damaged inflamed skin in her bilateral lower extremities.. Notes Wound exam; she has a sizable area on the left medial lower leg extending posteriorly. These look somewhat unusual and that they have nodules around some of them. There is scattered open areas bilaterally but nothing ominous.  She has dry flaking irritated damaged skin. The concerning areas to me are on the right anterior tibia flaking hyperkeratotic skin over what feels like nodular areas. Also on the left medial upper lower leg which I do not think there are areas under compression Electronic Signature(s) Signed: 11/29/2020 5:10:51 PM By: Linton Ham MD Entered By: Linton Ham on 11/29/2020 17:02:07 -------------------------------------------------------------------------------- Physician Orders Details Patient Name: Date of Service: [name], [place]. 11/29/2020 2:00 PM Medical Record Number: [id number] Patient Account Number: [id number] Date of Birth/Sex: Treating RN: July 22, 1925 (87 y.o. Elam Dutch Primary Care Provider: [name] Other Clinician: Referring Provider: Treating Provider/Extender: Thayer Headings in Treatment: 24 Verbal / Phone Orders: No Diagnosis Coding ICD-10 Coding Code Description C44.92 Squamous cell carcinoma of skin, unspecified L97.819 Non-pressure chronic ulcer of other part of right lower leg with unspecified severity L97.829 Non-pressure chronic ulcer of other part of left lower leg with unspecified severity I87.2 Venous insufficiency (chronic) (peripheral) Follow-up Appointments ppointment in 1 week. - with Dr. Heber [place] *73 Minutes* Return A Other: - patient to make appointment with dermatologist Dr. Ronnald Ramp to evaluate legs for any cancerous lesions Bathing/ Shower/ Hygiene May shower with protection but do not get wound dressing(s) wet. - Use cast protector Edema Control - Lymphedema / SCD / Other Elevate legs to the level of the heart or above for 30 minutes daily and/or when sitting, a frequency of: - elevate the legs throughout the day heart level if possible. Avoid standing for long periods of time. Exercise regularly Additional Orders / Instructions Follow Nutritious Diet Wound Treatment Wound #12 - Lower Leg Wound  Laterality: Left, Lateral Cleanser: Soap and Water 1 x Per Week/15 Days Discharge Instructions: May shower and wash wound with dial antibacterial soap and water prior to dressing change. Cleanser: Wound Cleanser 1 x Per Week/15 Days Discharge Instructions: Cleanse the wound with wound cleanser prior to applying a clean dressing using gauze sponges, not tissue or cotton balls. Peri-Wound Care: Triamcinolone 15 (g) 1 x Per Week/15 Days Discharge Instructions: In clinic only. Use triamcinolone 15 (g) in clinic only. Peri-Wound Care: Sween Lotion (Moisturizing lotion) 1 x Per Week/15 Days Discharge Instructions: Apply moisturizing lotion as directed Prim Dressing: Hydrofera Blue Ready Foam, 2.5 x2.5 in 1 x Per Week/15 Days ary Discharge Instructions: Apply to wound bed as instructed Secondary Dressing: Woven Gauze Sponge, Non-Sterile 4x4 in 1 x Per Week/15 Days Discharge Instructions: Apply over primary dressing as directed. Secondary Dressing: ABD Pad, 8x10 1 x Per Week/15 Days Discharge Instructions: Apply over primary dressing as directed. Compression Wrap: ThreePress (3 layer compression wrap) 1 x Per Week/15 Days Discharge Instructions: Apply three layer compression as directed. Wound #15 - Lower Leg Wound Laterality: Right, Posterior Cleanser: Soap and Water 1 x Per Week/15 Days Discharge Instructions: May shower and wash wound with dial antibacterial soap and water prior to dressing change. Cleanser: Wound Cleanser 1 x Per Week/15 Days Discharge Instructions: Cleanse the wound with wound cleanser prior to applying a clean dressing using gauze sponges, not tissue or cotton balls. Peri-Wound Care: Triamcinolone 15 (g) 1 x Per Week/15 Days Discharge Instructions: In clinic only. Use triamcinolone 15 (g) in clinic  only. Peri-Wound Care: Sween Lotion (Moisturizing lotion) 1 x Per Week/15 Days Discharge Instructions: Apply moisturizing lotion as directed Prim Dressing: Hydrofera Blue Ready  Foam, 2.5 x2.5 in 1 x Per Week/15 Days ary Discharge Instructions: Apply to wound bed as instructed Secondary Dressing: Woven Gauze Sponge, Non-Sterile 4x4 in 1 x Per Week/15 Days Discharge Instructions: Apply over primary dressing as directed. Secondary Dressing: ABD Pad, 8x10 1 x Per Week/15 Days Discharge Instructions: Apply over primary dressing as directed. Compression Wrap: ThreePress (3 layer compression wrap) 1 x Per Week/15 Days Discharge Instructions: Apply three layer compression as directed. Wound #7R - Lower Leg Wound Laterality: Left, Anterior Cleanser: Soap and Water 1 x Per Week/15 Days Discharge Instructions: May shower and wash wound with dial antibacterial soap and water prior to dressing change. Cleanser: Wound Cleanser 1 x Per Week/15 Days Discharge Instructions: Cleanse the wound with wound cleanser prior to applying a clean dressing using gauze sponges, not tissue or cotton balls. Peri-Wound Care: Triamcinolone 15 (g) 1 x Per Week/15 Days Discharge Instructions: In clinic only. Use triamcinolone 15 (g) in clinic only. Peri-Wound Care: Sween Lotion (Moisturizing lotion) 1 x Per Week/15 Days Discharge Instructions: Apply moisturizing lotion as directed Prim Dressing: Hydrofera Blue Ready Foam, 2.5 x2.5 in 1 x Per Week/15 Days ary Discharge Instructions: Apply to wound bed as instructed Secondary Dressing: Woven Gauze Sponge, Non-Sterile 4x4 in 1 x Per Week/15 Days Discharge Instructions: Apply over primary dressing as directed. Secondary Dressing: ABD Pad, 8x10 1 x Per Week/15 Days Discharge Instructions: Apply over primary dressing as directed. Compression Wrap: ThreePress (3 layer compression wrap) 1 x Per Week/15 Days Discharge Instructions: Apply three layer compression as directed. Wound #9 - Lower Leg Wound Laterality: Left, Posterior, Distal Cleanser: Soap and Water 1 x Per Week/15 Days Discharge Instructions: May shower and wash wound with dial antibacterial  soap and water prior to dressing change. Cleanser: Wound Cleanser 1 x Per Week/15 Days Discharge Instructions: Cleanse the wound with wound cleanser prior to applying a clean dressing using gauze sponges, not tissue or cotton balls. Peri-Wound Care: Triamcinolone 15 (g) 1 x Per Week/15 Days Discharge Instructions: In clinic only. Use triamcinolone 15 (g) in clinic only. Peri-Wound Care: Sween Lotion (Moisturizing lotion) 1 x Per Week/15 Days Discharge Instructions: Apply moisturizing lotion as directed Prim Dressing: Hydrofera Blue Ready Foam, 2.5 x2.5 in 1 x Per Week/15 Days ary Discharge Instructions: Apply to wound bed as instructed Secondary Dressing: Woven Gauze Sponge, Non-Sterile 4x4 in 1 x Per Week/15 Days Discharge Instructions: Apply over primary dressing as directed. Secondary Dressing: ABD Pad, 8x10 1 x Per Week/15 Days Discharge Instructions: Apply over primary dressing as directed. Compression Wrap: ThreePress (3 layer compression wrap) 1 x Per Week/15 Days Discharge Instructions: Apply three layer compression as directed. Electronic Signature(s) Signed: 11/29/2020 5:10:51 PM By: Linton Ham MD Signed: 11/29/2020 5:26:52 PM By: Baruch Gouty RN, BSN Entered By: Baruch Gouty on 11/29/2020 15:19:54 -------------------------------------------------------------------------------- Problem List Details Patient Name: Date of Service: [name], [place]. 11/29/2020 2:00 PM Medical Record Number: [id number] Patient Account Number: [id number] Date of Birth/Sex: Treating RN: [birth year] (87 y.o. Elam Dutch Primary Care Provider: [name] Other Clinician: Referring Provider: Treating Provider/Extender: Thayer Headings in Treatment: 24 Active Problems ICD-10 Encounter Code Description Active Date MDM Diagnosis C44.92 Squamous cell carcinoma of skin, unspecified 06/11/2020 No Yes L97.819 Non-pressure chronic ulcer of other  part of right lower leg with unspecified 06/11/2020 No Yes severity L97.829 Non-pressure  chronic ulcer of other part of left lower leg with unspecified 06/11/2020 No Yes severity I87.2 Venous insufficiency (chronic) (peripheral) 06/11/2020 No Yes Inactive Problems Resolved Problems Electronic Signature(s) Signed: 11/29/2020 5:10:51 PM By: Linton Ham MD Entered By: Linton Ham on 11/29/2020 16:56:36 -------------------------------------------------------------------------------- Progress Note Details Patient Name: Date of Service: [name], [place]. 11/29/2020 2:00 PM Medical Record Number: [id number] Patient Account Number: [id number] Date of Birth/Sex: Treating RN: Jul 01, 1925 (87 y.o. 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 She states that the wound on the right has been present for a year. The 1 on the left has opened up 1 month ago. She is followed with oncology for this issue as she had biopsies that showed squamous cell carcinoma. She is also seeing radiation oncology for treatment options. She presents today because she would like for her wounds to be healed by [place]. She currently denies signs of infection. 6/1; patient presents for 1 week follow-up. She states she has tolerated the leg wraps well. She states these do not bother her and is happy to continue with them. She is scheduled to see her oncologist today to  go over treatment options for the bilateral lower extremity squamous cell carcinoma. Radiation is currently not a recommended option. Patient states she overall feels well. 6/22; patient presents for 3-week follow-up. She has tolerated the wraps well until her last wrap where she states they were uncomfortable. She attributes this to the home health nurse. She denies signs of infection. She has started her first treatment of antibody infusions for her Bilateral lower extremity squamous cell carcinoma. She has no complaints today. 7/21; patient presents for 1 month follow-up. Unfortunately she has not had good experience with her wrap changes with home health. She would like to do her own dressing changes. She continues to do her antibody infusions. She denies signs of infection. 7/28; patient presents for 1 week follow-up. At last clinic visit she was switched to daily dressing changes due to issues with the wrap and home health placing them. Unfortunately she has developed weeping to her legs bilaterally. She would like to be placed in wraps today. She would also like to follow with [place] weekly for wrap changes instead of having home health change them. She denies signs of infection. 8/4; patient presents for 1 week follow-up. She has tolerated the Kerlix/Coban wraps well. She no longer has weeping to her legs. She took the wrap off 1 day before coming in to be able to take a shower. She has no issues or complaints today. She denies signs of infection. 8/18; patient presents for follow-up. Patient has tolerated the wraps well. She brought her Velcro compression wraps today. She has no issues or complaints today. She had her chemotherapy infusion yesterday without issues. She denies signs of infection. 8/25; patient presents for follow-up. She used her juxta lite compressions for the past week. It is unclear if she is able to put these on correctly since she states she has a hard time getting them to  look  right. She reports 2 open wounds. She currently denies signs of infection. 9/1; patient presents for 1 week follow-up. She has 1 open wound. She tolerated the compression wraps well. She currently denies signs of infection. 9/8; patient presents for 1 week follow-up. She has 2 open wounds 1 on each leg. She has tolerated the compression wrap well. She currently denies signs of infection. 9/15; patient presents for 1 week follow-up. She now has 3 wounds. 2 on the left and 1 on the right. She continues to tolerate the compression wrap well. She currently denies signs of infection. She has obtained furosemide by her primary care physician and would like to discuss when to take this. 9/22; patient presents for 1 week follow-up. She has scattered wounds on her lower extremities bilaterally. She did take furosemide twice in the past week. She does not recall having to urinate more frequently. She tolerated the 3 layer compression well. She denies signs of infection. 10/13; patient has not been here recently because of [place]. Apparently the facility was only putting gauze on her legs. This is a patient I do not normally see. 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 Ronnald Ramp at [facility] dermatology. She is quite convincing that she did not have radiation to her lower legs. It is likely she also has significant chronic venous insufficiency stasis dermatitis. We have been using silver alginate under kerlix Coban. She has obvious open areas medially on the right and areas on the left. She also has areas of extensive dry flaking adherent areas on the right and to a lesser extent on the left anterior. Nodular areas on the left lateral lower leg left posterior calf and right mid calf medially. 10/21; patient presents for follow-up. She has no issues or complaints today. She has tolerated the 3 layer compression well bilaterally. She  denies signs of infection. 10/27; patient presents for follow-up. She continues to tolerate the 3 layer compression wrap well. She denies signs of infection. 11/30; patient presents for follow-up. She has no issues or complaints today. 11/10; patient arrived in clinic today for nurse visit accompanied by her daughter from [place]. The daughter had multiple questions so we turned this into doctors visit. Apparently after the last time I saw this woman in October she went to see Dr. Ronnald Ramp but he did not take the wraps off. She previously was treated with Cemiplimab for her squamous cell carcinoma. She did not receive radiation to her legs. I had wanted Dr. Ronnald Ramp to look at this because of the exceptionally damaged skin on her lower legs bilaterally. She has odd looking wounds on the left medial lower leg also extending posteriorly which we have not made a lot of progress on. But she also has a raised hyperkeratotic nodules on the right anterior tibial area plaques on the left medial lower thigh. These are not areas that are under compression. We have been using silver alginate on any open areas Liberal TCA under 3 layer compression. Objective Constitutional Patient is hypertensive.. Pulse regular and within target range for patient.[name] Respirations regular, non-labored and within target range.. Temperature is normal and within the target range for the patient.[name] Appears in no distress. Vitals Time Taken: 1:58 PM, Height: 65 in, Weight: 163 lbs, BMI: 27.1, Temperature: 97.8 F, Pulse: 75 bpm, Respiratory Rate: 18 breaths/min, Blood Pressure: 158/74 mmHg. Cardiovascular Edema present in both extremities. This is mild. Some degree of chronic  venous insufficiency. General Notes: Wound exam; she has a sizable area on the left medial lower leg extending posteriorly. These look somewhat unusual and that they have nodules around some of them. There is scattered open areas bilaterally but nothing ominous. She  has dry flaking irritated damaged skin. The concerning areas to me are on the right anterior tibia flaking hyperkeratotic skin over what feels like nodular areas. Also on the left medial upper lower leg which I do not think there are areas under compression Integumentary (Hair, Skin) Severely damaged inflamed skin in her bilateral lower extremities.. Wound #12 status is Open. Original cause of wound was Gradually Appeared. The date acquired was: 11/01/2020. The wound has been in treatment 4 weeks. The wound is located on the Left,Lateral Lower Leg. The wound measures 1.5cm length x 1.4cm width x 0.1cm depth; 1.649cm^2 area and 0.165cm^3 volume. There is Fat Layer (Subcutaneous Tissue) exposed. There is no tunneling or undermining noted. There is a large amount of serous drainage noted. The wound margin is distinct with the outline attached to the wound base. There is large (67-100%) red granulation within the wound bed. There is no necrotic tissue within the wound bed. Wound #15 status is Open. Original cause of wound was Gradually Appeared. The date acquired was: 11/08/2020. The wound has been in treatment 3 weeks. The wound is located on the Right,Posterior Lower Leg. The wound measures 1cm length x 1cm width x 0.1cm depth; 0.785cm^2 area and 0.079cm^3 volume. There is Fat Layer (Subcutaneous Tissue) exposed. There is no tunneling or undermining noted. There is a medium amount of serosanguineous drainage noted. The wound margin is distinct with the outline attached to the wound base. There is large (67-100%) red, pink granulation within the wound bed. There is no necrotic tissue within the wound bed. Wound #16 status is Healed - Epithelialized. Original cause of wound was Gradually Appeared. The date acquired was: 11/08/2020. The wound has been in treatment 3 weeks. The wound is located on the Left,Proximal,Posterior Lower Leg. The wound measures 0cm length x 0cm width x 0cm depth; 0cm^2 area  and 0cm^3 volume. There is no tunneling or undermining noted. There is a none present amount of drainage noted. There is no granulation within the wound bed. There is no necrotic tissue within the wound bed. Wound #7R status is Open. Original cause of wound was Blister. The date acquired was: 04/20/2020. The wound has been in treatment 24 weeks. The wound is located on the Left,Anterior Lower Leg. The wound measures 0.2cm length x 0.1cm width x 0.1cm depth; 0.016cm^2 area and 0.002cm^3 volume. There is Fat Layer (Subcutaneous Tissue) exposed. There is no tunneling or undermining noted. There is a medium amount of serosanguineous drainage noted. The wound margin is distinct with the outline attached to the wound base. There is large (67-100%) red granulation within the wound bed. There is no necrotic tissue within the wound bed. Wound #9 status is Open. Original cause of wound was Gradually Appeared. The date acquired was: 10/04/2020. The wound has been in treatment 8 weeks. The wound is located on the Left,Distal,Posterior Lower Leg. The wound measures 8.5cm length x 15cm width x 0.1cm depth; 100.138cm^2 area and 10.014cm^3 volume. There is Fat Layer (Subcutaneous Tissue) exposed. There is no tunneling or undermining noted. There is a large amount of serosanguineous drainage noted. The wound margin is distinct with the outline attached to the wound base. There is large (67-100%) red, pink granulation within the wound bed. There is no  necrotic tissue within the wound bed. 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 There was a Three  Layer Compression Therapy Procedure by Baruch Gouty, RN. Post procedure Diagnosis Wound #15: Same as Pre-Procedure Wound #9 Pre-procedure diagnosis of Wound #9 is a Venous Leg Ulcer located on the Left,Distal,Posterior Lower Leg . There was a Three Layer Compression Therapy Procedure by Baruch Gouty, RN. Post procedure Diagnosis Wound #9: Same as Pre-Procedure Plan Follow-up Appointments: Return Appointment in 1 week. - with Dr. Heber Owenton *49 Minutes* Other: - patient to make appointment with dermatologist Dr. Ronnald Ramp to evaluate legs for any cancerous lesions Bathing/ Shower/ Hygiene: May shower with protection but do not get wound dressing(s) wet. - Use cast protector Edema Control - Lymphedema / SCD / Other: Elevate legs to the level of the heart or above for 30 minutes daily and/or when sitting, a frequency of: - elevate the legs throughout the day heart level if possible. Avoid standing for long periods of time. Exercise regularly Additional Orders / Instructions: Follow Nutritious Diet WOUND #12: - Lower Leg Wound Laterality: Left, Lateral Cleanser: Soap and Water 1 x Per Week/15 Days Discharge Instructions: May shower and wash wound with dial antibacterial soap and water prior to dressing change. Cleanser: Wound Cleanser 1 x Per Week/15 Days Discharge Instructions: Cleanse the wound with wound cleanser prior to applying a clean dressing using gauze sponges, not tissue or cotton balls. Peri-Wound Care: Triamcinolone 15 (g) 1 x Per Week/15 Days Discharge Instructions: In clinic only. Use triamcinolone 15 (g) in clinic only. Peri-Wound Care: Sween Lotion (Moisturizing lotion) 1 x Per Week/15 Days Discharge Instructions: Apply moisturizing lotion as directed Prim Dressing: Hydrofera Blue Ready Foam, 2.5 x2.5 in 1 x Per Week/15 Days ary Discharge Instructions: Apply to wound bed as instructed Secondary Dressing: Woven Gauze Sponge, Non-Sterile 4x4 in 1 x Per Week/15  Days Discharge Instructions: Apply over primary dressing as directed. Secondary Dressing: ABD Pad, 8x10 1 x Per Week/15 Days Discharge Instructions: Apply over primary dressing as directed. Com pression Wrap: ThreePress (3 layer compression wrap) 1 x Per Week/15 Days Discharge Instructions: Apply three layer compression as directed. WOUND #15: - Lower Leg Wound Laterality: Right, Posterior Cleanser: Soap and Water 1 x Per Week/15 Days Discharge Instructions: May shower and wash wound with dial antibacterial soap and water prior to dressing change. Cleanser: Wound Cleanser 1 x Per Week/15 Days Discharge Instructions: Cleanse the wound with wound cleanser prior to applying a clean dressing using gauze sponges, not tissue or cotton balls. Peri-Wound Care: Triamcinolone 15 (g) 1 x Per Week/15 Days Discharge Instructions: In clinic only. Use triamcinolone 15 (g) in clinic only. Peri-Wound Care: Sween Lotion (Moisturizing lotion) 1 x Per Week/15 Days Discharge Instructions: Apply moisturizing lotion as directed Prim Dressing: Hydrofera Blue Ready Foam, 2.5 x2.5 in 1 x Per Week/15 Days ary Discharge Instructions: Apply to wound bed as instructed Secondary Dressing: Woven Gauze Sponge, Non-Sterile 4x4 in 1 x Per Week/15 Days Discharge Instructions: Apply over primary dressing as directed. Secondary Dressing: ABD Pad, 8x10 1 x Per Week/15 Days Discharge Instructions: Apply over primary dressing as directed. Com pression Wrap: ThreePress (3 layer compression wrap) 1 x Per Week/15 Days Discharge  Instructions: Apply three layer compression as directed. WOUND #7R: - Lower Leg Wound Laterality: Left, Anterior Cleanser: Soap and Water 1 x Per Week/15 Days Discharge Instructions: May shower and wash wound with dial antibacterial soap and water prior to dressing change. Cleanser: Wound Cleanser 1 x Per Week/15 Days Discharge Instructions: Cleanse the wound with wound cleanser prior to applying a clean  dressing using gauze sponges, not tissue or cotton balls. Peri-Wound Care: Triamcinolone 15 (g) 1 x Per Week/15 Days Discharge Instructions: In clinic only. Use triamcinolone 15 (g) in clinic only. Peri-Wound Care: Sween Lotion (Moisturizing lotion) 1 x Per Week/15 Days Discharge Instructions: Apply moisturizing lotion as directed Prim Dressing: Hydrofera Blue Ready Foam, 2.5 x2.5 in 1 x Per Week/15 Days ary Discharge Instructions: Apply to wound bed as instructed Secondary Dressing: Woven Gauze Sponge, Non-Sterile 4x4 in 1 x Per Week/15 Days Discharge Instructions: Apply over primary dressing as directed. Secondary Dressing: ABD Pad, 8x10 1 x Per Week/15 Days Discharge Instructions: Apply over primary dressing as directed. Com pression Wrap: ThreePress (3 layer compression wrap) 1 x Per Week/15 Days Discharge Instructions: Apply three layer compression as directed. WOUND #9: - Lower Leg Wound Laterality: Left, Posterior, Distal Cleanser: Soap and Water 1 x Per Week/15 Days Discharge Instructions: May shower and wash wound with dial antibacterial soap and water prior to dressing change. Cleanser: Wound Cleanser 1 x Per Week/15 Days Discharge Instructions: Cleanse the wound with wound cleanser prior to applying a clean dressing using gauze sponges, not tissue or cotton balls. Peri-Wound Care: Triamcinolone 15 (g) 1 x Per Week/15 Days Discharge Instructions: In clinic only. Use triamcinolone 15 (g) in clinic only. Peri-Wound Care: Sween Lotion (Moisturizing lotion) 1 x Per Week/15 Days Discharge Instructions: Apply moisturizing lotion as directed Prim Dressing: Hydrofera Blue Ready Foam, 2.5 x2.5 in 1 x Per Week/15 Days ary Discharge Instructions: Apply to wound bed as instructed Secondary Dressing: Woven Gauze Sponge, Non-Sterile 4x4 in 1 x Per Week/15 Days Discharge Instructions: Apply over primary dressing as directed. Secondary Dressing: ABD Pad, 8x10 1 x Per Week/15 Days Discharge  Instructions: Apply over primary dressing as directed. Com pression Wrap: ThreePress (3 layer compression wrap) 1 x Per Week/15 Days Discharge Instructions: Apply three layer compression as directed. 1. I had wanted Dr. Ronnald Ramp to look at her legs for consideration of anything he feels needs biopsying. But apparently when he saw her the wraps were not taken off and something was removed from an arm. Her daughter says that she was told that all the cancer was obliterated by her course of monoclonal antibodies. If so I would like to have Dr. Ronnald Ramp say that 2. I think she does have chronic venous insufficiency probably some degree of previous lymphedema. Skin is tightly sclerosed chronically damaged skin in the lower extremities. 3. I change what we are putting on the left medial lower leg to [facility] or not making any progress here. Still using liberal TCA and moisturizer under compression. She has had [facility] which is part of the [facility] 4. Her daughter was upset that I did not answer a comment in my chart. I told him that I do not look at my chart comments in epic. She also said she tried to call here and I apologize that we never got back to her. I am not sure where the ball was dropped here. Electronic Signature(s) Signed: 11/29/2020 5:10:51 PM By: Linton Ham MD Entered By: Linton Ham on 11/29/2020 17:05:27 -------------------------------------------------------------------------------- SuperBill Details Patient  Name: Date of Service: [name], [place]. 11/29/2020 Medical Record Number: [id number] Patient Account Number: [id number] Date of Birth/Sex: Treating RN: [id number] (87 y.o. Elam Dutch Primary Care Provider: [name] Other Clinician: Referring Provider: Treating Provider/Extender: Thayer Headings in Treatment: 24 Diagnosis Coding ICD-10 Codes Code Description C44.92 Squamous cell carcinoma of skin,  unspecified L97.819 Non-pressure chronic ulcer of other part of right lower leg with unspecified severity L97.829 Non-pressure chronic ulcer of other part of left lower leg with unspecified severity I87.2 Venous insufficiency (chronic) (peripheral) Facility Procedures CPT4: Code [id number] Description: 81 BILATERAL: Application of multi-layer venous compression system; leg (below knee), including ankle and t. Modifier: Quantity: 1 Physician Procedures : CPT4 Code Description Modifier [id number] - WC PHYS LEVEL 3 - EST PT ICD-10 Diagnosis Description C44.92 Squamous cell carcinoma of skin, unspecified L97.819 Non-pressure chronic ulcer of other part of right lower leg with unspecified severity  L97.829 Non-pressure chronic ulcer of other part of left lower leg with unspecified severity I87.2 Venous insufficiency (chronic) (peripheral) Quantity: 1 Electronic Signature(s) Signed: 11/29/2020 5:10:51 PM By: Linton Ham MD Entered By: Linton Ham on 11/29/2020 17:04:45

## 2020-11-29 NOTE — Telephone Encounter (Signed)
Cancelled appts per sch msg. Scheduled virtual visit. Called, not able to leave msg. Mailed printout

## 2020-11-29 NOTE — Progress Notes (Signed)
[name] ([id number]) , Visit Report for 11/29/2020 Arrival Information Details Patient Name: Date of Service: [name], [name]. 11/29/2020 2:00 PM Medical Record Number: [id number] Patient Account Number: [id number] Date of Birth/Sex: Treating RN: 1925-12-06 (87 y.o. [name] Primary Care Tracy Gerken: [name] Other Clinician: Referring Evaleen Sant: Treating Abia Monaco/Extender: Thayer Headings in Treatment: 24 Visit Information History Since Last Visit Added or deleted any medications: No Patient Arrived: [name] Any new allergies or adverse reactions: No Arrival Time: 13:51 Had a fall or experienced change in No Accompanied By: [name] activities of daily living that may affect Transfer Assistance: None risk of falls: Patient Identification Verified: Yes Signs or symptoms of abuse/neglect since last visito No Secondary Verification Process Completed: Yes Hospitalized since last visit: No Patient Requires Transmission-Based No Implantable device outside of the clinic excluding No Precautions: cellular tissue based products placed in the center Patient Has Alerts: Yes since last visit: Patient Alerts: R ABI = Non compressible Has Dressing in Place as Prescribed: Yes L ABI = Non compressible Has Compression in Place as Prescribed: Yes Pain Present Now: No Electronic Signature(s) Signed: 11/29/2020 5:26:52 PM By: Baruch Gouty RN, BSN Entered By: Baruch Gouty on 11/29/2020 13:57:03 -------------------------------------------------------------------------------- Compression Therapy Details Patient Name: Date of Service: [name], [name] 11/29/2020 2:00 PM Medical Record Number: [id number] Patient Account Number: [id number] Date of Birth/Sex: Treating RN: 09-10-1925 (87 y.o. [name] Primary Care Lanyah Spengler: [name] Other Clinician: Referring Myrah Strawderman: Treating Rusell Meneely/Extender: Thayer Headings in Treatment: 24 Compression Therapy Performed for Wound Assessment: Wound #9 Left,Distal,Posterior Lower Leg Performed By: Clinician Baruch Gouty, RN Compression Type: Three Layer Post Procedure Diagnosis Same as Pre-procedure Electronic Signature(s) Signed: 11/29/2020 5:26:52 PM By: Baruch Gouty RN, BSN Entered By: Baruch Gouty on 11/29/2020 15:13:07 -------------------------------------------------------------------------------- Compression Therapy Details Patient Name: Date of Service: [name], [place]. 11/29/2020 2:00 PM Medical Record Number: [id number] Patient Account Number: [id number] Date of Birth/Sex: Treating RN: 07/15/1925 (87 y.o. [name] Primary Care Audreyana Huntsberry: [name] Other Clinician: Referring Evangelos Paulino: Treating Glennda Weatherholtz/Extender: Thayer Headings in Treatment: 24 Compression Therapy Performed for Wound Assessment: Wound #15 Right,Posterior Lower Leg Performed By: Clinician Baruch Gouty, RN Compression Type: Three Layer Post Procedure Diagnosis Same as Pre-procedure Electronic Signature(s) Signed: 11/29/2020 5:26:52 PM By: Baruch Gouty RN, BSN Entered By: Baruch Gouty on 11/29/2020 15:13:07 -------------------------------------------------------------------------------- Lower Extremity Assessment Details Patient Name: Date of Service: [name], [place]. 11/29/2020 2:00 PM Medical Record Number: [id number] Patient Account Number: [id number] Date of Birth/Sex: Treating RN: Aug 22, 1925 (87 y.o. [name] Primary Care Reanne Nellums: [name] Other Clinician: Referring Jarissa Sheriff: Treating Senya Hinzman/Extender: Thayer Headings in Treatment: 24 Edema Assessment Assessed: [name]: No] [name]: No] Edema: [Left: Yes] [Right: Yes] Calf Left: Right: Point of Measurement: 30 cm From Medial Instep 34 cm 31 cm Ankle Left:  Right: Point of Measurement: 9 cm From Medial Instep 23 cm 22.4 cm Vascular Assessment Pulses: Dorsalis Pedis Palpable: [Left:No] [Right:No] Electronic Signature(s) Signed: 11/29/2020 5:26:52 PM By: Baruch Gouty RN, BSN Entered By: Baruch Gouty on 11/29/2020 14:38:29 -------------------------------------------------------------------------------- Multi Wound Chart Details Patient Name: Date of Service: [name], [place]. 11/29/2020 2:00 PM Medical Record Number: [id number] Patient Account Number: [id number] Date of Birth/Sex: Treating RN: 1925-08-26 (87 y.o. [name] Primary Care Jaziah Goeller: [name] Other Clinician: Referring Vonya Ohalloran: Treating Chalmers Iddings/Extender: Thayer Headings in Treatment: 24 Vital Signs  Height(in): 65 Pulse(bpm): 75 Weight(lbs): 163 Blood Pressure(mmHg): 158/74 Body Mass Index(BMI): 27 Temperature(F): 97.8 Respiratory Rate(breaths/min): 18 Photos: Left, Lateral Lower Leg Right, Posterior Lower Leg Left, Proximal, Posterior Lower Leg Wound Location: Gradually Appeared Gradually Appeared Gradually Appeared Wounding Event: Venous Leg Ulcer Venous Leg Ulcer Venous Leg Ulcer Primary Etiology: Anemia, Arrhythmia, Congestive Heart Anemia, Arrhythmia, Congestive Heart Anemia, Arrhythmia, Congestive Heart Comorbid History: Failure, Hypertension, Colitis, Failure, Hypertension, Colitis, Failure, Hypertension, Colitis, Osteoarthritis Osteoarthritis Osteoarthritis 11/01/2020 11/08/2020 11/08/2020 Date Acquired: 4 3 3  Weeks of Treatment: Open Open Healed - Epithelialized Wound Status: No No No Wound Recurrence: Yes Yes No Clustered Wound: 3 N/A N/A Clustered Quantity: 1.5x1.4x0.1 1x1x0.1 0x0x0 Measurements L x W x D (cm) 1.649 0.785 0 A (cm) : rea 0.165 0.079 0 Volume (cm) : 75.30% 80.00% 100.00% % Reduction in Area: 75.30% 79.90% 100.00% % Reduction in Volume: Full Thickness Without  Exposed Full Thickness Without Exposed Full Thickness Without Exposed Classification: Support Structures Support Structures Support Structures Large Medium None Present Exudate Amount: Serous Serosanguineous N/A Exudate Type: amber red, brown N/A Exudate Color: Distinct, outline attached Distinct, outline attached N/A Wound Margin: Large (67-100%) Large (67-100%) None Present (0%) Granulation Amount: Red Red, Pink N/A Granulation Quality: None Present (0%) None Present (0%) None Present (0%) Necrotic Amount: Fat Layer (Subcutaneous Tissue): Yes Fat Layer (Subcutaneous Tissue): Yes Fascia: No Exposed Structures: Fascia: No Fascia: No Fat Layer (Subcutaneous Tissue): No Tendon: No Tendon: No Tendon: No Muscle: No Muscle: No Muscle: No Joint: No Joint: No Joint: No Bone: No Bone: No Bone: No Medium (34-66%) Large (67-100%) Large (67-100%) Epithelialization: N/A Compression Therapy N/A Procedures Performed: Wound Number: [id number] N/A Photos: N/A Left, Anterior Lower Leg Left, Distal, Posterior Lower Leg N/A Wound Location: Blister Gradually Appeared N/A Wounding Event: Malignant Wound Venous Leg Ulcer N/A Primary Etiology: Anemia, Arrhythmia, Congestive Heart Anemia, Arrhythmia, Congestive Heart N/A Comorbid History: Failure, Hypertension, Colitis, Failure, Hypertension, Colitis, Osteoarthritis Osteoarthritis 04/20/2020 10/04/2020 N/A Date Acquired: 24 8 N/A Weeks of Treatment: Open Open N/A Wound Status: Yes No N/A Wound Recurrence: No No N/A Clustered Wound: N/A N/A N/A Clustered Quantity: 0.2x0.1x0.1 8.5x15x0.1 N/A Measurements L x W x D (cm) 0.016 100.138 N/A A (cm) : rea 0.002 10.014 N/A Volume (cm) : 99.50% -53164.90% N/A % Reduction in Area: 99.30% -52605.30% N/A % Reduction in Volume: Full Thickness Without Exposed Full Thickness Without Exposed N/A Classification: Support Structures Support Structures Medium Large N/A Exudate  Amount: Serosanguineous Serosanguineous N/A Exudate Type: red, brown red, brown N/A Exudate Color: Distinct, outline attached Distinct, outline attached N/A Wound Margin: Large (67-100%) Large (67-100%) N/A Granulation Amount: Red Red, Pink N/A Granulation Quality: None Present (0%) None Present (0%) N/A Necrotic Amount: Fat Layer (Subcutaneous Tissue): Yes Fat Layer (Subcutaneous Tissue): Yes N/A Exposed Structures: Fascia: No Fascia: No Tendon: No Tendon: No Muscle: No Muscle: No Joint: No Joint: No Bone: No Bone: No Medium (34-66%) Medium (34-66%) N/A Epithelialization: N/A Compression Therapy N/A Procedures Performed: Treatment Notes Electronic Signature(s) Signed: 11/29/2020 5:10:51 PM By: Linton Ham MD Signed: 11/29/2020 5:26:52 PM By: Baruch Gouty RN, BSN Entered By: Linton Ham on 11/29/2020 16:56:46 -------------------------------------------------------------------------------- Multi-Disciplinary Care Plan Details Patient Name: Date of Service: [name], [name] 11/29/2020 2:00 PM Medical Record Number: [id number] Patient Account Number: [id number] Date of Birth/Sex: Treating RN: 1925/09/02 (87 y.o. [name] Primary Care Katlin Ciszewski: [name] Other Clinician: Referring Marleena Shubert: Treating Deaun Rocha/Extender: Thayer Headings in Treatment: 24 Multidisciplinary Care Plan reviewed with physician  Active Inactive Venous Leg Ulcer Nursing Diagnoses: Knowledge deficit related to disease process and management Potential for venous Insuffiency (use before diagnosis confirmed) Goals: Patient will maintain optimal edema control Date Initiated: 11/01/2020 Target Resolution Date: 12/27/2020 Goal Status: Active Interventions: Assess peripheral edema status every visit. Compression as ordered Provide education on venous insufficiency Treatment Activities: Therapeutic compression applied :  11/01/2020 Notes: Wound/Skin Impairment Nursing Diagnoses: Impaired tissue integrity Knowledge deficit related to ulceration/compromised skin integrity Goals: Patient/caregiver will verbalize understanding of skin care regimen Date Initiated: 06/11/2020 Target Resolution Date: 12/27/2020 Goal Status: Active Interventions: Assess patient/caregiver ability to obtain necessary supplies Assess patient/caregiver ability to perform ulcer/skin care regimen upon admission and as needed Assess ulceration(s) every visit Provide education on ulcer and skin care Notes: 10/11/20: Wound care regimen ongoing. Electronic Signature(s) Signed: 11/29/2020 5:26:52 PM By: Baruch Gouty RN, BSN Entered By: Baruch Gouty on 11/29/2020 15:10:13 -------------------------------------------------------------------------------- Pain Assessment Details Patient Name: Date of Service: [name], [place]. 11/29/2020 2:00 PM Medical Record Number: [id number] Patient Account Number: [id number] Date of Birth/Sex: Treating RN: 09/30/1925 (87 y.o. [name] Primary Care Tessah Patchen: [name] Other Clinician: Referring Shields Pautz: Treating Dalaina Tates/Extender: Thayer Headings in Treatment: 24 Active Problems Location of Pain Severity and Description of Pain Patient Has Paino No Site Locations Rate the pain. Current Pain Level: 0 Pain Management and Medication Current Pain Management: Electronic Signature(s) Signed: 11/29/2020 5:26:52 PM By: Baruch Gouty RN, BSN Entered By: Baruch Gouty on 11/29/2020 14:35:14 -------------------------------------------------------------------------------- Patient/Caregiver Education Details Patient Name: Date of Service: [name], [name] 11/10/2022andnbsp2:00 PM Medical Record Number: [id number] Patient Account Number: [id number] Date of Birth/Gender: Treating RN: [birth year] (87 y.o. [name] Primary Care  Physician: [name] Other Clinician: Referring Physician: Treating Physician/Extender: Thayer Headings in Treatment: 24 Education Assessment Education Provided To: Patient Education Topics Provided Venous: Methods: Explain/Verbal Responses: Reinforcements needed, State content correctly Wound/Skin Impairment: Methods: Explain/Verbal Responses: Reinforcements needed, State content correctly Electronic Signature(s) Signed: 11/29/2020 5:26:52 PM By: Baruch Gouty RN, BSN Entered By: Baruch Gouty on 11/29/2020 15:10:45 -------------------------------------------------------------------------------- Wound Assessment Details Patient Name: Date of Service: [name], [name] 11/29/2020 2:00 PM Medical Record Number: [id number] Patient Account Number: [id number] Date of Birth/Sex: Treating RN: [birth year] (87 y.o. Martyn Malay, Vaughan Basta Primary Care Alioune Hodgkin: [name] Other Clinician: Referring Aries Townley: Treating Bianka Liberati/Extender: Thayer Headings in Treatment: 24 Wound Status Wound Number: 12 Primary Venous Leg Ulcer Etiology: Wound Location: Left, Lateral Lower Leg Wound Open Wounding Event: Gradually Appeared Status: Date Acquired: 11/01/2020 Comorbid Anemia, Arrhythmia, Congestive Heart Failure, Hypertension, Weeks Of Treatment: 4 History: Colitis, Osteoarthritis Clustered Wound: Yes Photos Wound Measurements Length: (cm) 1 Width: (cm) 1 Depth: (cm) 0 Clustered Quantity: 3 Area: (cm) Volume: (cm) .5 % Reduction in Area: 75.3% .4 % Reduction in Volume: 75.3% .1 Epithelialization: Medium (34-66%) Tunneling: No 1.649 Undermining: No 0.165 Wound Description Classification: Full Thickness Without Exposed Support Structures Wound Margin: Distinct, outline attached Exudate Amount: Large Exudate Type: Serous Exudate Color: amber Foul Odor After Cleansing: No Slough/Fibrino Yes Wound  Bed Granulation Amount: Large (67-100%) Exposed Structure Granulation Quality: Red Fascia Exposed: No Necrotic Amount: None Present (0%) Fat Layer (Subcutaneous Tissue) Exposed: Yes Tendon Exposed: No Muscle Exposed: No Joint Exposed: No Bone Exposed: No Electronic Signature(s) Signed: 11/29/2020 5:26:52 PM By: Baruch Gouty RN, BSN Entered By: Baruch Gouty on 11/29/2020 14:50:29 -------------------------------------------------------------------------------- Wound Assessment Details Patient Name: Date of Service: [name], [name] [place]. 11/29/2020 2:00  PM Medical Record Number: [id number] Patient Account Number: [id number] Date of Birth/Sex: Treating RN: 11-Jun-1925 (87 y.o. [name] Primary Care Larue Drawdy: [name] Other Clinician: Referring Karen Kinnard: Treating Zoii Florer/Extender: Thayer Headings in Treatment: 24 Wound Status Wound Number: 15 Primary Venous Leg Ulcer Etiology: Wound Location: Right, Posterior Lower Leg Wound Open Wounding Event: Gradually Appeared Status: Date Acquired: 11/08/2020 Comorbid Anemia, Arrhythmia, Congestive Heart Failure, Hypertension, Weeks Of Treatment: 3 History: Colitis, Osteoarthritis Clustered Wound: Yes Photos Wound Measurements Length: (cm) 1 Width: (cm) 1 Depth: (cm) 0.1 Area: (cm) 0.785 Volume: (cm) 0.079 % Reduction in Area: 80% % Reduction in Volume: 79.9% Epithelialization: Large (67-100%) Tunneling: No Undermining: No Wound Description Classification: Full Thickness Without Exposed Support Structures Wound Margin: Distinct, outline attached Exudate Amount: Medium Exudate Type: Serosanguineous Exudate Color: red, brown Foul Odor After Cleansing: No Slough/Fibrino No Wound Bed Granulation Amount: Large (67-100%) Exposed Structure Granulation Quality: Red, Pink Fascia Exposed: No Necrotic Amount: None Present (0%) Fat Layer (Subcutaneous Tissue) Exposed: Yes Tendon  Exposed: No Muscle Exposed: No Joint Exposed: No Bone Exposed: No Electronic Signature(s) Signed: 11/29/2020 5:26:52 PM By: Baruch Gouty RN, BSN Entered By: Baruch Gouty on 11/29/2020 14:51:43 -------------------------------------------------------------------------------- Wound Assessment Details Patient Name: Date of Service: [name], [place]. 11/29/2020 2:00 PM Medical Record Number: [id number] Patient Account Number: [id number] Date of Birth/Sex: Treating RN: 04-01-1925 (87 y.o. [name] Primary Care Eevie Lapp: [name] Other Clinician: Referring Jemmie Ledgerwood: Treating Chameka Mcmullen/Extender: Thayer Headings in Treatment: 24 Wound Status Wound Number: 71 Primary Venous Leg Ulcer Etiology: Wound Location: Left, Proximal, Posterior Lower Leg Wound Healed - Epithelialized Wounding Event: Gradually Appeared Status: Date Acquired: 11/08/2020 Comorbid Anemia, Arrhythmia, Congestive Heart Failure, Hypertension, Weeks Of Treatment: 3 History: Colitis, Osteoarthritis Clustered Wound: No Photos Wound Measurements Length: (cm) Width: (cm) Depth: (cm) Area: (cm) Volume: (cm) 0 % Reduction in Area: 100% 0 % Reduction in Volume: 100% 0 Epithelialization: Large (67-100%) 0 Tunneling: No 0 Undermining: No Wound Description Classification: Full Thickness Without Exposed Support Structures Exudate Amount: None Present Foul Odor After Cleansing: No Slough/Fibrino No Wound Bed Granulation Amount: None Present (0%) Exposed Structure Necrotic Amount: None Present (0%) Fascia Exposed: No Fat Layer (Subcutaneous Tissue) Exposed: No Tendon Exposed: No Muscle Exposed: No Joint Exposed: No Bone Exposed: No Electronic Signature(s) Signed: 11/29/2020 5:26:52 PM By: Baruch Gouty RN, BSN Entered By: Baruch Gouty on 11/29/2020 14:52:46 -------------------------------------------------------------------------------- Wound  Assessment Details Patient Name: Date of Service: [name], [name] 11/29/2020 2:00 PM Medical Record Number: [id number] Patient Account Number: [id number] Date of Birth/Sex: Treating RN: April 08, 1925 (87 y.o. [name] Primary Care Katalena Malveaux: [name] Other Clinician: Referring Shanetha Bradham: Treating Adisynn Suleiman/Extender: Thayer Headings in Treatment: 24 Wound Status Wound Number: [id number] Primary Malignant Wound Etiology: Wound Location: Left, Anterior Lower Leg Wound Open Wounding Event: Blister Status: Date Acquired: 04/20/2020 Comorbid Anemia, Arrhythmia, Congestive Heart Failure, Hypertension, Weeks Of Treatment: 24 History: Colitis, Osteoarthritis Clustered Wound: No Photos Wound Measurements Length: (cm) 0.2 Width: (cm) 0.1 Depth: (cm) 0.1 Area: (cm) 0.016 Volume: (cm) 0.002 % Reduction in Area: 99.5% % Reduction in Volume: 99.3% Epithelialization: Medium (34-66%) Tunneling: No Undermining: No Wound Description Classification: Full Thickness Without Exposed Support Structures Wound Margin: Distinct, outline attached Exudate Amount: Medium Exudate Type: Serosanguineous Exudate Color: red, brown Foul Odor After Cleansing: No Slough/Fibrino No Wound Bed Granulation Amount: Large (67-100%) Exposed Structure Granulation Quality: Red Fascia Exposed: No Necrotic Amount: None Present (0%)  Fat Layer (Subcutaneous Tissue) Exposed: Yes Tendon Exposed: No Muscle Exposed: No Joint Exposed: No Bone Exposed: No Electronic Signature(s) Signed: 11/29/2020 5:26:52 PM By: Baruch Gouty RN, BSN Entered By: Baruch Gouty on 11/29/2020 14:53:29 -------------------------------------------------------------------------------- Wound Assessment Details Patient Name: Date of Service: [name], [place]. 11/29/2020 2:00 PM Medical Record Number: [id number] Patient Account Number: [id number] Date of Birth/Sex: Treating RN: [birth year] (87  y.o. Martyn Malay, Vaughan Basta Primary Care Kaiser Belluomini: [name] Other Clinician: Referring Philomene Haff: Treating Alexxia Stankiewicz/Extender: Thayer Headings in Treatment: 24 Wound Status Wound Number: 9 Primary Venous Leg Ulcer Etiology: Wound Location: Left, Distal, Posterior Lower Leg Wound Open Wounding Event: Gradually Appeared Status: Date Acquired: 10/04/2020 Comorbid Anemia, Arrhythmia, Congestive Heart Failure, Hypertension, Weeks Of Treatment: 8 History: Colitis, Osteoarthritis Clustered Wound: No Photos Wound Measurements Length: (cm) 8.5 Width: (cm) 15 Depth: (cm) 0.1 Area: (cm) 100.138 Volume: (cm) 10.014 % Reduction in Area: -53164.9% % Reduction in Volume: -52605.3% Epithelialization: Medium (34-66%) Tunneling: No Undermining: No Wound Description Classification: Full Thickness Without Exposed Support Structures Wound Margin: Distinct, outline attached Exudate Amount: Large Exudate Type: Serosanguineous Exudate Color: red, brown Foul Odor After Cleansing: No Slough/Fibrino Yes Wound Bed Granulation Amount: Large (67-100%) Exposed Structure Granulation Quality: Red, Pink Fascia Exposed: No Necrotic Amount: None Present (0%) Fat Layer (Subcutaneous Tissue) Exposed: Yes Tendon Exposed: No Muscle Exposed: No Joint Exposed: No Bone Exposed: No Electronic Signature(s) Signed: 11/29/2020 5:26:52 PM By: Baruch Gouty RN, BSN Entered By: Baruch Gouty on 11/29/2020 14:54:27 -------------------------------------------------------------------------------- [place] Details Patient Name: Date of Service: [name], [place]. 11/29/2020 2:00 PM Medical Record Number: [id number] Patient Account Number: [id number] Date of Birth/Sex: Treating RN: 1926/01/03 (87 y.o. [name] Primary Care Buffi Ewton: [name] Other Clinician: Referring Keyly Baldonado: Treating [name] Rawlinson/Extender: Thayer Headings in  Treatment: 24 Vital Signs Time Taken: 13:58 Temperature (F): 97.8 Height (in): 65 Pulse (bpm): 75 Weight (lbs): 163 Respiratory Rate (breaths/min): 18 Body Mass Index (BMI): 27.1 Blood Pressure (mmHg): 158/74 Reference Range: 80 - 120 mg / dl Electronic Signature(s) Signed: 11/29/2020 5:26:52 PM By: Baruch Gouty RN, BSN Entered By: Baruch Gouty on 11/29/2020 13:59:13

## 2020-12-05 ENCOUNTER — Encounter: Payer: Self-pay | Admitting: Internal Medicine

## 2020-12-05 DIAGNOSIS — Z85828 Personal history of other malignant neoplasm of skin: Secondary | ICD-10-CM | POA: Diagnosis not present

## 2020-12-05 DIAGNOSIS — L570 Actinic keratosis: Secondary | ICD-10-CM | POA: Diagnosis not present

## 2020-12-05 DIAGNOSIS — I83222 Varicose veins of left lower extremity with both ulcer of calf and inflammation: Secondary | ICD-10-CM | POA: Diagnosis not present

## 2020-12-05 DIAGNOSIS — C44722 Squamous cell carcinoma of skin of right lower limb, including hip: Secondary | ICD-10-CM | POA: Diagnosis not present

## 2020-12-05 DIAGNOSIS — L0889 Other specified local infections of the skin and subcutaneous tissue: Secondary | ICD-10-CM | POA: Diagnosis not present

## 2020-12-05 DIAGNOSIS — L308 Other specified dermatitis: Secondary | ICD-10-CM | POA: Diagnosis not present

## 2020-12-06 ENCOUNTER — Encounter (HOSPITAL_BASED_OUTPATIENT_CLINIC_OR_DEPARTMENT_OTHER): Payer: Medicare Other | Admitting: Internal Medicine

## 2020-12-06 ENCOUNTER — Telehealth: Payer: Self-pay | Admitting: Internal Medicine

## 2020-12-06 ENCOUNTER — Other Ambulatory Visit: Payer: Self-pay

## 2020-12-06 DIAGNOSIS — C4492 Squamous cell carcinoma of skin, unspecified: Secondary | ICD-10-CM | POA: Diagnosis not present

## 2020-12-06 DIAGNOSIS — Z85828 Personal history of other malignant neoplasm of skin: Secondary | ICD-10-CM | POA: Diagnosis not present

## 2020-12-06 DIAGNOSIS — I872 Venous insufficiency (chronic) (peripheral): Secondary | ICD-10-CM | POA: Diagnosis not present

## 2020-12-06 DIAGNOSIS — L97819 Non-pressure chronic ulcer of other part of right lower leg with unspecified severity: Secondary | ICD-10-CM

## 2020-12-06 DIAGNOSIS — L97829 Non-pressure chronic ulcer of other part of left lower leg with unspecified severity: Secondary | ICD-10-CM

## 2020-12-06 NOTE — Telephone Encounter (Signed)
Patient daughter [name] calling in  Says patient legs are extremely swollen & patient is now having a lot of open sores/wounds on her legs  Patient other dr would like patient to start taking diuretic to help relieve some of the swelling but advised patient to get w/ pcp  Daughter says patient has generic lacis at home & wants to know if its okay for patient to start taking it  Please call back at earliest convenience

## 2020-12-06 NOTE — Progress Notes (Signed)
[name] ([id number]) , Visit Report for 12/06/2020 Chief Complaint Document Details Patient Name: Date of Service: [name], [place] 12/06/2020 12:30 PM Medical Record Number: [id number] Patient Account Number: [id number] Date of Birth/Sex: Treating RN: 1925/04/16 (87 y.o. F) Primary Care Provider: [name] Other Clinician: Referring Provider: Treating Provider/Extender: [name] in Treatment: 25 Information Obtained from: Patient Chief Complaint Bilateral lower extremity wounds that have been biopsied and positive for squamous cell carcinoma Electronic Signature(s) Signed: 12/06/2020 1:40:29 PM By: Kalman Shan DO Entered By: Kalman Shan on 12/06/2020 13:32:15 -------------------------------------------------------------------------------- HPI Details Patient Name: Date of Service: [name], [place]. 12/06/2020 12:30 PM Medical Record Number: [id number] Patient Account Number: [id number] Date of Birth/Sex: Treating RN: 1925-10-04 (87 y.o. F) Primary Care Provider: [name] Other Clinician: Referring Provider: Treating Provider/Extender: [name] in Treatment: 25 History of Present Illness Location: left leg HPI Description: Admission 5/23 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 She states that the wound on the right has been present for a year. The 1 on the left has opened up 1 month ago. She is followed with oncology for this issue as she had biopsies that showed squamous cell carcinoma. She is also seeing radiation oncology for treatment options. She presents today because she would like for her wounds to be healed by [place]. She currently  denies signs of infection. 6/1; patient presents for 1 week follow-up. She states she has tolerated the leg wraps well. She states these do not bother her and is happy to continue with them. She is scheduled to see her oncologist today to go over treatment options for the bilateral lower extremity squamous cell carcinoma. Radiation is currently not a recommended option. Patient states she overall feels well. 6/22; patient presents for 3-week follow-up. She has tolerated the wraps well until her last wrap where she states they were uncomfortable. She attributes this to the home health nurse. She denies signs of infection. She has started her first treatment of antibody infusions for her Bilateral lower extremity squamous cell carcinoma. She has no complaints today. 7/21; patient presents for 1 month follow-up. Unfortunately she has not had good experience with her wrap changes with home health. She would like to do her own dressing changes. She continues to do her antibody infusions. She denies signs of infection. 7/28; patient presents for 1 week follow-up. At last clinic visit she was switched to daily dressing changes due to issues with the wrap and home health placing them. Unfortunately she has developed weeping to her legs bilaterally. She would like to be placed in wraps today. She would also like to follow with [place] weekly for wrap changes instead of having home health change them. She denies signs of infection. 8/4; patient presents for 1 week follow-up. She has tolerated the Kerlix/Coban wraps well. She no longer has weeping to her legs. She took the wrap off 1 day before coming in to be able to take a shower. She has no issues or complaints today. She denies signs of infection. 8/18; patient presents for follow-up. Patient has tolerated the wraps well. She brought her Velcro compression wraps today. She has no issues or complaints today. She had her chemotherapy infusion yesterday without  issues. She  denies signs of infection. 8/25; patient presents for follow-up. She used her juxta lite compressions for the past week. It is unclear if she is able to put these on correctly since she states she has a hard time getting them to look right. She reports 2 open wounds. She currently denies signs of infection. 9/1; patient presents for 1 week follow-up. She has 1 open wound. She tolerated the compression wraps well. She currently denies signs of infection. 9/8; patient presents for 1 week follow-up. She has 2 open wounds 1 on each leg. She has tolerated the compression wrap well. She currently denies signs of infection. 9/15; patient presents for 1 week follow-up. She now has 3 wounds. 2 on the left and 1 on the right. She continues to tolerate the compression wrap well. She currently denies signs of infection. She has obtained furosemide by her primary care physician and would like to discuss when to take this. 9/22; patient presents for 1 week follow-up. She has scattered wounds on her lower extremities bilaterally. She did take furosemide twice in the past week. She does not recall having to urinate more frequently. She tolerated the 3 layer compression well. She denies signs of infection. 10/13; patient has not been here recently because of [place]. Apparently the facility was only putting gauze on her legs. This is a patient I do not normally see. 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 Ronnald Ramp at [facility] dermatology. She is quite convincing that she did not have radiation to her lower legs. It is likely she also has significant chronic venous insufficiency stasis dermatitis. We have been using silver alginate under kerlix Coban. She has obvious open areas medially on the right and areas on the left. She also has areas of extensive dry flaking adherent areas on the right and to a lesser extent on the left  anterior. Nodular areas on the left lateral lower leg left posterior calf and right mid calf medially. 10/21; patient presents for follow-up. She has no issues or complaints today. She has tolerated the 3 layer compression well bilaterally. She denies signs of infection. 10/27; patient presents for follow-up. She continues to tolerate the 3 layer compression wrap well. She denies signs of infection. 11/30; patient presents for follow-up. She has no issues or complaints today. 11/10; patient arrived in clinic today for nurse visit accompanied by her daughter from [place]. The daughter had multiple questions so we turned this into doctors visit. Apparently after the last time I saw this woman in October she went to see Dr. Ronnald Ramp but he did not take the wraps off. She previously was treated with Cemiplimab for her squamous cell carcinoma. She did not receive radiation to her legs. I had wanted Dr. Ronnald Ramp to look at this because of the exceptionally damaged skin on her lower legs bilaterally. She has odd looking wounds on the left medial lower leg also extending posteriorly which we have not made a lot of progress on. But she also has a raised hyperkeratotic nodules on the right anterior tibial area plaques on the left medial lower thigh. These are not areas that are under compression. We have been using silver alginate on any open areas Liberal TCA under 3 layer compression. 11/17; patient presents for follow-up. She had her wraps taken off yesterday for her dermatology appointment. She reports excessive weeping to her legs bilaterally after the wraps were taken off. She currently  denies signs of infection. Electronic Signature(s) Signed: 12/06/2020 1:40:29 PM By: Kalman Shan DO Entered By: Kalman Shan on 12/06/2020 13:33:22 -------------------------------------------------------------------------------- Physical Exam Details Patient Name: Date of Service: [name], [place] 12/06/2020 12:30  PM Medical Record Number: [id number] Patient Account Number: [id number] Date of Birth/Sex: Treating RN: Nov 30, 1925 (87 y.o. F) Primary Care Provider: [name] Other Clinician: Referring Provider: Treating Provider/Extender: [name] in Treatment: 25 Constitutional respirations regular, non-labored and within target range for patient.. Cardiovascular 2+ dorsalis pedis/posterior tibialis pulses. Psychiatric pleasant and cooperative. Notes Scattered open wounds to her legs bilaterally limited to skin breakdown. The largest wound is to the posterior left lower extremity with granulation tissue present And islands of epithelialization occurring in the center. No signs of infection. Electronic Signature(s) Signed: 12/06/2020 1:40:29 PM By: Kalman Shan DO Entered By: Kalman Shan on 12/06/2020 13:34:29 -------------------------------------------------------------------------------- Physician Orders Details Patient Name: Date of Service: [name], [place]. 12/06/2020 12:30 PM Medical Record Number: [id number] Patient Account Number: [id number] Date of Birth/Sex: Treating RN: Jun 11, 1925 (87 y.o. [name] Primary Care Provider: [name] Other Clinician: Referring Provider: Treating Provider/Extender: [name] in Treatment: 54 Verbal / Phone Orders: No Diagnosis Coding ICD-10 Coding Code Description C44.92 Squamous cell carcinoma of skin, unspecified L97.819 Non-pressure chronic ulcer of other part of right lower leg with unspecified severity L97.829 Non-pressure chronic ulcer of other part of left lower leg with unspecified severity I87.2 Venous insufficiency (chronic) (peripheral) Follow-up Appointments ppointment in 1 week. - with Dr. Heber Mobile Return A Bathing/ Shower/ Hygiene May shower with protection but do not get wound dressing(s) wet. - Use cast protector Edema  Control - Lymphedema / SCD / Other Elevate legs to the level of the heart or above for 30 minutes daily and/or when sitting, a frequency of: - elevate the legs throughout the day heart level if possible. Avoid standing for long periods of time. Exercise regularly Additional Orders / Instructions Follow Nutritious Diet Wound Treatment Wound #15 - Lower Leg Wound Laterality: Right, Posterior Cleanser: Soap and Water 1 x Per Week/15 Days Discharge Instructions: May shower and wash wound with dial antibacterial soap and water prior to dressing change. Cleanser: Wound Cleanser 1 x Per Week/15 Days Discharge Instructions: Cleanse the wound with wound cleanser prior to applying a clean dressing using gauze sponges, not tissue or cotton balls. Peri-Wound Care: Triamcinolone 15 (g) 1 x Per Week/15 Days Discharge Instructions: In clinic only. Use triamcinolone 15 (g) in clinic only. Peri-Wound Care: Sween Lotion (Moisturizing lotion) 1 x Per Week/15 Days Discharge Instructions: Apply moisturizing lotion as directed Prim Dressing: KerraCel Ag Gelling Fiber Dressing, 4x5 in (silver alginate) 1 x Per Week/15 Days ary Discharge Instructions: Apply silver alginate to wound bed as instructed Secondary Dressing: Woven Gauze Sponge, Non-Sterile 4x4 in 1 x Per Week/15 Days Discharge Instructions: Apply over primary dressing as directed. Secondary Dressing: ABD Pad, 8x10 1 x Per Week/15 Days Discharge Instructions: Apply over primary dressing as directed. Compression Wrap: ThreePress (3 layer compression wrap) 1 x Per Week/15 Days Discharge Instructions: Apply three layer compression as directed. Wound #7R - Lower Leg Wound Laterality: Left, Anterior Cleanser: Soap and Water 1 x Per Week/15 Days Discharge Instructions: May shower and wash wound with dial antibacterial soap and water prior to dressing change. Cleanser: Wound Cleanser 1 x Per Week/15 Days Discharge Instructions: Cleanse the wound with wound  cleanser prior to applying a clean dressing using gauze sponges, not tissue  or cotton balls. Peri-Wound Care: Triamcinolone 15 (g) 1 x Per Week/15 Days Discharge Instructions: In clinic only. Use triamcinolone 15 (g) in clinic only. Peri-Wound Care: Sween Lotion (Moisturizing lotion) 1 x Per Week/15 Days Discharge Instructions: Apply moisturizing lotion as directed Prim Dressing: KerraCel Ag Gelling Fiber Dressing, 4x5 in (silver alginate) 1 x Per Week/15 Days ary Discharge Instructions: Apply silver alginate to wound bed as instructed Secondary Dressing: Woven Gauze Sponge, Non-Sterile 4x4 in 1 x Per Week/15 Days Discharge Instructions: Apply over primary dressing as directed. Secondary Dressing: ABD Pad, 8x10 1 x Per Week/15 Days Discharge Instructions: Apply over primary dressing as directed. Compression Wrap: ThreePress (3 layer compression wrap) 1 x Per Week/15 Days Discharge Instructions: Apply three layer compression as directed. Wound #9 - Lower Leg Wound Laterality: Left, Posterior, Distal Cleanser: Soap and Water 1 x Per Week/15 Days Discharge Instructions: May shower and wash wound with dial antibacterial soap and water prior to dressing change. Cleanser: Wound Cleanser 1 x Per Week/15 Days Discharge Instructions: Cleanse the wound with wound cleanser prior to applying a clean dressing using gauze sponges, not tissue or cotton balls. Peri-Wound Care: Triamcinolone 15 (g) 1 x Per Week/15 Days Discharge Instructions: In clinic only. Use triamcinolone 15 (g) in clinic only. Peri-Wound Care: Sween Lotion (Moisturizing lotion) 1 x Per Week/15 Days Discharge Instructions: Apply moisturizing lotion as directed Prim Dressing: KerraCel Ag Gelling Fiber Dressing, 4x5 in (silver alginate) 1 x Per Week/15 Days ary Discharge Instructions: Apply silver alginate to wound bed as instructed Secondary Dressing: Woven Gauze Sponge, Non-Sterile 4x4 in 1 x Per Week/15 Days Discharge Instructions:  Apply over primary dressing as directed. Secondary Dressing: ABD Pad, 8x10 1 x Per Week/15 Days Discharge Instructions: Apply over primary dressing as directed. Compression Wrap: ThreePress (3 layer compression wrap) 1 x Per Week/15 Days Discharge Instructions: Apply three layer compression as directed. Electronic Signature(s) Signed: 12/06/2020 1:40:29 PM By: Kalman Shan DO Entered By: Kalman Shan on 12/06/2020 13:35:14 -------------------------------------------------------------------------------- Problem List Details Patient Name: Date of Service: [name], [place] 12/06/2020 12:30 PM Medical Record Number: [id number] Patient Account Number: [id number] Date of Birth/Sex: Treating RN: 28-Mar-1925 (87 y.o. [name] Primary Care Provider: [name] Other Clinician: Referring Provider: Treating Provider/Extender: [name] in Treatment: 25 Active Problems ICD-10 Encounter Code Description Active Date MDM Diagnosis C44.92 Squamous cell carcinoma of skin, unspecified 06/11/2020 No Yes L97.819 Non-pressure chronic ulcer of other part of right lower leg with unspecified 06/11/2020 No Yes severity L97.829 Non-pressure chronic ulcer of other part of left lower leg with unspecified 06/11/2020 No Yes severity I87.2 Venous insufficiency (chronic) (peripheral) 06/11/2020 No Yes Inactive Problems Resolved Problems Electronic Signature(s) Signed: 12/06/2020 1:40:29 PM By: Kalman Shan DO Entered By: Kalman Shan on 12/06/2020 13:31:57 -------------------------------------------------------------------------------- Progress Note Details Patient Name: Date of Service: [name], Asaiah A. 12/06/2020 12:30 PM Medical Record Number: [id number] Patient Account Number: [id number] Date of Birth/Sex: Treating RN: [birth year] (87 y.o. F) Primary Care Provider: [name] Other Clinician: Referring Provider: Treating  Provider/Extender: [name] in Treatment: 25 Subjective Chief Complaint Information obtained from Patient Bilateral lower extremity wounds that have been biopsied and positive for squamous cell carcinoma History of Present Illness (HPI) The following HPI elements were documented for the patient's wound: Location: left leg Admission 5/23 Ms. 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 She states that the wound on the right has been present for a year. The 1 on the left has opened up 1 month ago. She is followed with oncology for this issue as she had biopsies that showed squamous cell carcinoma. She is also seeing radiation oncology for treatment options. She presents today because she would like for her wounds to be healed by [place]. She currently denies signs of infection. 6/1; patient presents for 1 week follow-up. She states she has tolerated the leg wraps well. She states these do not bother her and is happy to continue with them. She is scheduled to see her oncologist today to go over treatment options for the bilateral lower extremity squamous cell carcinoma. Radiation is currently not a recommended option. Patient states she overall feels well. 6/22; patient presents for 3-week follow-up. She has tolerated the wraps well until her last wrap where she states they were uncomfortable. She attributes this to the home health nurse. She denies signs of infection. She has started her first treatment of antibody infusions for her Bilateral lower extremity squamous cell carcinoma. She has no complaints today. 7/21; patient presents for 1 month follow-up. Unfortunately she has not had good experience with her wrap changes with home health.  She would like to do her own dressing changes. She continues to do her antibody infusions. She denies signs of infection. 7/28; patient presents for 1 week follow-up. At last clinic visit she was switched to daily dressing changes due to issues with the wrap and home health placing them. Unfortunately she has developed weeping to her legs bilaterally. She would like to be placed in wraps today. She would also like to follow with [place] weekly for wrap changes instead of having home health change them. She denies signs of infection. 8/4; patient presents for 1 week follow-up. She has tolerated the Kerlix/Coban wraps well. She no longer has weeping to her legs. She took the wrap off 1 day before coming in to be able to take a shower. She has no issues or complaints today. She denies signs of infection. 8/18; patient presents for follow-up. Patient has tolerated the wraps well. She brought her Velcro compression wraps today. She has no issues or complaints today. She had her chemotherapy infusion yesterday without issues. She denies signs of infection. 8/25; patient presents for follow-up. She used her juxta lite compressions for the past week. It is unclear if she is able to put these on correctly since she states she has a hard time getting them to look right. She reports 2 open wounds. She currently denies signs of infection. 9/1; patient presents for 1 week follow-up. She has 1 open wound. She tolerated the compression wraps well. She currently denies signs of infection. 9/8; patient presents for 1 week follow-up. She has 2 open wounds 1 on each leg. She has tolerated the compression wrap well. She currently denies signs of infection. 9/15; patient presents for 1 week follow-up. She now has 3 wounds. 2 on the left and 1 on the right. She continues to tolerate the compression wrap well. She currently denies signs of infection. She has obtained furosemide by her primary care physician and would like to  discuss when to take this. 9/22; patient presents for 1 week follow-up. She has scattered wounds on her lower extremities bilaterally. She did take furosemide twice in the past week. She does not recall  having to urinate more frequently. She tolerated the 3 layer compression well. She denies signs of infection. 10/13; patient has not been here recently because of [place]. Apparently the facility was only putting gauze on her legs. This is a patient I do not normally see. 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 Ronnald Ramp at [facility] dermatology. She is quite convincing that she did not have radiation to her lower legs. It is likely she also has significant chronic venous insufficiency stasis dermatitis. We have been using silver alginate under kerlix Coban. She has obvious open areas medially on the right and areas on the left. She also has areas of extensive dry flaking adherent areas on the right and to a lesser extent on the left anterior. Nodular areas on the left lateral lower leg left posterior calf and right mid calf medially. 10/21; patient presents for follow-up. She has no issues or complaints today. She has tolerated the 3 layer compression well bilaterally. She denies signs of infection. 10/27; patient presents for follow-up. She continues to tolerate the 3 layer compression wrap well. She denies signs of infection. 11/30; patient presents for follow-up. She has no issues or complaints today. 11/10; patient arrived in clinic today for nurse visit accompanied by her daughter from [place]. The daughter had multiple questions so we turned this into doctors visit. Apparently after the last time I saw this woman in October she went to see Dr. Ronnald Ramp but he did not take the wraps off. She previously was treated with Cemiplimab for her squamous cell carcinoma. She did not receive radiation to her legs. I had wanted Dr. Ronnald Ramp  to look at this because of the exceptionally damaged skin on her lower legs bilaterally. She has odd looking wounds on the left medial lower leg also extending posteriorly which we have not made a lot of progress on. But she also has a raised hyperkeratotic nodules on the right anterior tibial area plaques on the left medial lower thigh. These are not areas that are under compression. We have been using silver alginate on any open areas Liberal TCA under 3 layer compression. 11/17; patient presents for follow-up. She had her wraps taken off yesterday for her dermatology appointment. She reports excessive weeping to her legs bilaterally after the wraps were taken off. She currently denies signs of infection. Patient History Information obtained from Patient. Family History Unknown History. Social History Never smoker, Marital Status - Single, Alcohol Use - Never, Drug Use - No History, Caffeine Use - Never. Medical History Eyes Denies history of Cataracts, Optic Neuritis Ear/Nose/Mouth/Throat Denies history of Chronic sinus problems/congestion, Middle ear problems Hematologic/Lymphatic Patient has history of Anemia Denies history of Hemophilia, Human Immunodeficiency Virus, Lymphedema, Sickle Cell Disease Respiratory Denies history of Aspiration, Asthma, Chronic Obstructive Pulmonary Disease (COPD), Pneumothorax, Sleep Apnea, Tuberculosis Cardiovascular Patient has history of Arrhythmia - Atrial Flutter, A fibb, Congestive Heart Failure, Hypertension Denies history of Angina, Coronary Artery Disease, Deep Vein Thrombosis, Hypotension, Myocardial Infarction, Peripheral Arterial Disease, Peripheral Venous Disease, Phlebitis, Vasculitis Gastrointestinal Patient has history of Colitis Denies history of Cirrhosis , Crohnoos, Hepatitis A, Hepatitis B, Hepatitis C Endocrine Denies history of Type I Diabetes, Type II Diabetes Genitourinary Denies history of End Stage Renal  Disease Immunological Denies history of Lupus Erythematosus, Raynaudoos, Scleroderma Integumentary (Skin) Denies history of History of Burn Musculoskeletal Patient has history of Osteoarthritis Denies history of Gout, Rheumatoid Arthritis, Osteomyelitis Neurologic Denies  history of Dementia, Neuropathy, Quadriplegia, Paraplegia, Seizure Disorder Oncologic Denies history of Received Chemotherapy, Received Radiation Hospitalization/Surgery History - removal of rod left leg. 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 Vitals Time Taken: 12:45 PM, Height: 65 in, Weight: 163 lbs, BMI: 27.1, Temperature: 98.0 F, Pulse: 56 bpm, Respiratory Rate: 16 breaths/min, Blood Pressure: 104/58 mmHg. Cardiovascular 2+ dorsalis pedis/posterior tibialis pulses. Psychiatric pleasant and cooperative. General Notes: Scattered open wounds to her legs bilaterally limited to skin breakdown. The largest wound is to the posterior left lower extremity with granulation tissue present And islands of epithelialization occurring in the center. No signs of infection. Integumentary (Hair, Skin) Wound #15 status is Open. Original cause of wound was Gradually Appeared. The date acquired was: 11/08/2020. The wound has been in treatment 4 weeks. The wound is located on the Right,Posterior Lower Leg. The wound measures 11cm length x 14cm width x 0.1cm depth; 120.951cm^2 area and 12.095cm^3 volume. There is Fat Layer (Subcutaneous Tissue) exposed. There is no tunneling or undermining noted. There is a medium amount of serosanguineous drainage noted. The wound margin is distinct with the outline attached to the wound base. There is large (67-100%) red, pink granulation within the wound bed. There is no  necrotic tissue within the wound bed. Wound #7R status is Open. Original cause of wound was Blister. The date acquired was: 04/20/2020. The wound has been in treatment 25 weeks. The wound is located on the Left,Anterior Lower Leg. The wound measures 0.5cm length x 0.4cm width x 0.1cm depth; 0.157cm^2 area and 0.016cm^3 volume. There is Fat Layer (Subcutaneous Tissue) exposed. There is no tunneling or undermining noted. There is a medium amount of serosanguineous drainage noted. The wound margin is distinct with the outline attached to the wound base. There is large (67-100%) red granulation within the wound bed. There is no necrotic tissue within the wound bed. Wound #9 status is Open. Original cause of wound was Gradually Appeared. The date acquired was: 10/04/2020. The wound has been in treatment 9 weeks. The wound is located on the Left,Distal,Posterior Lower Leg. The wound measures 9.5cm length x 15.5cm width x 0.1cm depth; 115.65cm^2 area and 11.565cm^3 volume. There is Fat Layer (Subcutaneous Tissue) exposed. There is no tunneling or undermining noted. There is a large amount of serosanguineous drainage noted. The wound margin is distinct with the outline attached to the wound base. There is large (67-100%) red, pink granulation within the wound bed. There is no necrotic tissue within the wound bed. 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 Her legs are more swollen today but this is because she has not had compression wraps for the past 24 hours. She reports increased weeping to her legs. There are no signs of infection on exam. In the past we have tried a trial  of diuretic but this does not seem to have helped. She may need to be on chronic Lasix in order to address the chronic lower extremity swelling. She cannot be wrapped indefinitely. I will message her PCP to discuss further treatment plan. I recommended switching back to silver alginate under 3 layer compression.  Follow-up in 1 week. Procedures Wound #15 Pre-procedure diagnosis of Wound #15 is a Venous Leg Ulcer located on the Right,Posterior Lower Leg . There was a Three Layer Compression Therapy Procedure by Levan Hurst, RN. Post procedure Diagnosis Wound #15: Same as Pre-Procedure Wound #7R Pre-procedure diagnosis of Wound #7R is a Malignant Wound located on the Left,Anterior Lower Leg . There was a Three Layer Compression Therapy Procedure by Levan Hurst, RN. Post procedure Diagnosis Wound #7R: Same as Pre-Procedure Wound #9 Pre-procedure diagnosis of Wound #9 is a Venous Leg Ulcer located on the Left,Distal,Posterior Lower Leg . There was a Three Layer Compression Therapy Procedure by Levan Hurst, RN. Post procedure Diagnosis Wound #9: Same as Pre-Procedure Plan Follow-up Appointments: Return Appointment in 1 week. - with Dr. Heber Essex Bathing/ Shower/ Hygiene: May shower with protection but do not get wound dressing(s) wet. - Use cast protector Edema Control - Lymphedema / SCD / Other: Elevate legs to the level of the heart or above for 30 minutes daily and/or when sitting, a frequency of: - elevate the legs throughout the day heart level if possible. Avoid standing for long periods of time. Exercise regularly Additional Orders / Instructions: Follow Nutritious Diet WOUND #15: - Lower Leg Wound Laterality: Right, Posterior Cleanser: Soap and Water 1 x Per Week/15 Days Discharge Instructions: May shower and wash wound with dial antibacterial soap and water prior to dressing change. Cleanser: Wound Cleanser 1 x Per Week/15 Days Discharge Instructions: Cleanse the wound with  wound cleanser prior to applying a clean dressing using gauze sponges, not tissue or cotton balls. Peri-Wound Care: Triamcinolone 15 (g) 1 x Per Week/15 Days Discharge Instructions: In clinic only. Use triamcinolone 15 (g) in clinic only. Peri-Wound Care: Sween Lotion (Moisturizing lotion) 1 x Per Week/15 Days Discharge Instructions: Apply moisturizing lotion as directed Prim Dressing: KerraCel Ag Gelling Fiber Dressing, 4x5 in (silver alginate) 1 x Per Week/15 Days ary Discharge Instructions: Apply silver alginate to wound bed as instructed Secondary Dressing: Woven Gauze Sponge, Non-Sterile 4x4 in 1 x Per Week/15 Days Discharge Instructions: Apply over primary dressing as directed. Secondary Dressing: ABD Pad, 8x10 1 x Per Week/15 Days Discharge Instructions: Apply over primary dressing as directed. Com pression Wrap: ThreePress (3 layer compression wrap) 1 x Per Week/15 Days Discharge Instructions: Apply three layer compression as directed. WOUND #7R: - Lower Leg Wound Laterality: Left, Anterior Cleanser: Soap and Water 1 x Per Week/15 Days Discharge Instructions: May shower and wash wound with dial antibacterial soap and water prior to dressing change. Cleanser: Wound Cleanser 1 x Per Week/15 Days Discharge Instructions: Cleanse the wound with wound cleanser prior to applying a clean dressing using gauze sponges, not tissue or cotton balls. Peri-Wound Care: Triamcinolone 15 (g) 1 x Per Week/15 Days Discharge Instructions: In clinic only. Use triamcinolone 15 (g) in clinic only. Peri-Wound Care: Sween Lotion (Moisturizing lotion) 1 x Per Week/15 Days Discharge Instructions: Apply moisturizing lotion as directed Prim Dressing: KerraCel Ag Gelling Fiber Dressing, 4x5 in (silver alginate) 1 x Per Week/15 Days ary Discharge Instructions: Apply silver alginate to wound bed as instructed Secondary Dressing: Woven Gauze Sponge, Non-Sterile 4x4 in 1 x Per Week/15 Days Discharge Instructions:  Apply over primary dressing as directed. Secondary Dressing: ABD Pad, 8x10 1 x Per Week/15 Days Discharge Instructions: Apply over primary dressing as directed. Com pression Wrap: ThreePress (3 layer compression wrap) 1 x Per Week/15 Days Discharge Instructions: Apply three layer compression as directed. WOUND #9: - Lower Leg Wound Laterality: Left,  Posterior, Distal Cleanser: Soap and Water 1 x Per Week/15 Days Discharge Instructions: May shower and wash wound with dial antibacterial soap and water prior to dressing change. Cleanser: Wound Cleanser 1 x Per Week/15 Days Discharge Instructions: Cleanse the wound with wound cleanser prior to applying a clean dressing using gauze sponges, not tissue or cotton balls. Peri-Wound Care: Triamcinolone 15 (g) 1 x Per Week/15 Days Discharge Instructions: In clinic only. Use triamcinolone 15 (g) in clinic only. Peri-Wound Care: Sween Lotion (Moisturizing lotion) 1 x Per Week/15 Days Discharge Instructions: Apply moisturizing lotion as directed Prim Dressing: KerraCel Ag Gelling Fiber Dressing, 4x5 in (silver alginate) 1 x Per Week/15 Days ary Discharge Instructions: Apply silver alginate to wound bed as instructed Secondary Dressing: Woven Gauze Sponge, Non-Sterile 4x4 in 1 x Per Week/15 Days Discharge Instructions: Apply over primary dressing as directed. Secondary Dressing: ABD Pad, 8x10 1 x Per Week/15 Days Discharge Instructions: Apply over primary dressing as directed. Com pression Wrap: ThreePress (3 layer compression wrap) 1 x Per Week/15 Days Discharge Instructions: Apply three layer compression as directed. 1. Silver alginate under 3 layer compression 2. May need chronic diuretic-discussed with PCP 3. Follow-up in 1 week Electronic Signature(s) Signed: 12/06/2020 1:40:29 PM By: Kalman Shan DO Entered By: Kalman Shan on 12/06/2020 13:39:47 -------------------------------------------------------------------------------- HxROS  Details Patient Name: Date of Service: [name], [place]. 12/06/2020 12:30 PM Medical Record Number: [id number] Patient Account Number: [id number] Date of Birth/Sex: Treating RN: 1925-07-21 (87 y.o. F) Primary Care Provider: [name] Other Clinician: Referring Provider: Treating Provider/Extender: [name] in Treatment: 25 Information Obtained From Patient Eyes Medical History: Negative for: Cataracts; Optic Neuritis Ear/Nose/Mouth/Throat Medical History: Negative for: Chronic sinus problems/congestion; Middle ear problems Hematologic/Lymphatic Medical History: Positive for: Anemia Negative for: Hemophilia; Human Immunodeficiency Virus; Lymphedema; Sickle Cell Disease Respiratory Medical History: Negative for: Aspiration; Asthma; Chronic Obstructive Pulmonary Disease (COPD); Pneumothorax; Sleep Apnea; Tuberculosis Cardiovascular Medical History: Positive for: Arrhythmia - Atrial Flutter, A fibb; Congestive Heart Failure; Hypertension Negative for: Angina; Coronary Artery Disease; Deep Vein Thrombosis; Hypotension; Myocardial Infarction; Peripheral Arterial Disease; Peripheral Venous Disease; Phlebitis; Vasculitis Past Medical History Notes: hyperlipidemia Gastrointestinal Medical History: Positive for: Colitis Negative for: Cirrhosis ; Crohns; Hepatitis A; Hepatitis B; Hepatitis C Endocrine Medical History: Negative for: Type I Diabetes; Type II Diabetes Past Medical History Notes: hypothyroidism Genitourinary Medical History: Negative for: End Stage Renal Disease Immunological Medical History: Negative for: Lupus Erythematosus; Raynauds; Scleroderma Integumentary (Skin) Medical History: Negative for: History of Burn Musculoskeletal Medical History: Positive for: Osteoarthritis Negative for: Gout; Rheumatoid Arthritis; Osteomyelitis Neurologic Medical History: Negative for: Dementia; Neuropathy; Quadriplegia;  Paraplegia; Seizure Disorder Past Medical History Notes: lumbar spindylolysis Oncologic Medical History: Negative for: Received Chemotherapy; Received Radiation Past Medical History Notes: BLE squamous ceel carcionoma Immunizations Pneumococcal Vaccine: Received Pneumococcal Vaccination: Yes Received Pneumococcal Vaccination On or After 60th Birthday: No Implantable Devices None Hospitalization / Surgery History Type of Hospitalization/Surgery removal of rod left leg Family and Social History Unknown History: Yes; Never smoker; Marital Status - Single; Alcohol Use: Never; Drug Use: No History; Caffeine Use: Never; Financial Concerns: No; Food, Clothing or Shelter Needs: No; Support System Lacking: No; Transportation Concerns: No Electronic Signature(s) Signed: 12/06/2020 1:40:29 PM By: Kalman Shan DO Entered By: Kalman Shan on 12/06/2020 13:33:29 -------------------------------------------------------------------------------- SuperBill Details Patient Name: Date of Service: [name], Lannette A. 12/06/2020 Medical Record Number: [id number] Patient Account Number: [id number] Date of Birth/Sex: Treating RN: April 11, 1925 (87 y.o. [name] Primary Care Provider: [name],  [name] Other Clinician: Referring Provider: Treating Provider/Extender: [name] in Treatment: 25 Diagnosis Coding ICD-10 Codes Code Description C44.92 Squamous cell carcinoma of skin, unspecified L97.819 Non-pressure chronic ulcer of other part of right lower leg with unspecified severity L97.829 Non-pressure chronic ulcer of other part of left lower leg with unspecified severity I87.2 Venous insufficiency (chronic) (peripheral) Facility Procedures CPT4: Code [id number] Description: 81 BILATERAL: Application of multi-layer venous compression system; leg (below knee), including ankle and t. Modifier: Quantity: 1 Physician Procedures Electronic  Signature(s) Signed: 12/06/2020 1:40:29 PM By: Kalman Shan DO Entered By: Kalman Shan on 12/06/2020 13:40:05

## 2020-12-06 NOTE — Progress Notes (Signed)
[name] ([id number]) , Visit Report for 12/06/2020 Arrival Information Details Patient Name: Date of Service: [name], [place]. 12/06/2020 12:30 PM Medical Record Number: [id number] Patient Account Number: [id number] Date of Birth/Sex: Treating RN: 11-Nov-1925 (88 y.o. [name] Primary Care [name]: [name] Other Clinician: Referring [name]: Treating [name]/Extender: [name] in Treatment: [name] Visit Information History Since Last Visit Added or deleted any medications: No Patient Arrived: Walker Any new allergies or adverse reactions: No Arrival Time: 12:45 Had a fall or experienced change in No Accompanied By: daughter activities of daily living that may affect Transfer Assistance: None risk of falls: Patient Identification Verified: Yes Signs or symptoms of abuse/neglect since last visito No Secondary Verification Process Completed: Yes Hospitalized since last visit: No Patient Requires Transmission-Based Precautions: No Implantable device outside of the clinic excluding No Patient Has Alerts: Yes cellular tissue based products placed in the center Patient Alerts: R ABI = Non compressible since last visit: L ABI = Non compressible Has Dressing in Place as Prescribed: No Has Compression in Place as Prescribed: No Pain Present Now: No Electronic Signature(s) Signed: 12/06/2020 5:23:58 PM By: [name] Hurst RN, BSN Entered By: [name] on 12/06/2020 12:45:41 -------------------------------------------------------------------------------- Compression Therapy Details Patient Name: Date of Service: [name], [name] 12/06/2020 12:30 PM Medical Record Number: [id number] Patient Account Number: [id number] Date of Birth/Sex: Treating RN: [birth year] (87 y.o. [name] Primary Care [name]: [name] Other Clinician: Referring [name]: Treating [name]/Extender: [name] in Treatment: [name] Compression Therapy Performed for Wound Assessment: Wound #15 [name],Posterior [name] Leg Performed By: Clinician [name] Hurst, RN Compression Type: Three Layer Post Procedure Diagnosis Same as Pre-procedure Electronic Signature(s) Signed: 12/06/2020 5:23:58 PM By: [name] Hurst RN, BSN Entered By: [name] on 12/06/2020 13:23:01 -------------------------------------------------------------------------------- Compression Therapy Details Patient Name: Date of Service: [name], [name]. 12/06/2020 12:30 PM Medical Record Number: [id number] Patient Account Number: [id number] Date of Birth/Sex: Treating RN: 01/02/1926 (87 y.o. [name] Primary Care [name]: [name] Other Clinician: Referring Berry Gallacher: Treating [name]/Extender: [name] in Treatment: [name] Compression Therapy Performed for Wound Assessment: Wound #7R Left,Anterior [name] Leg Performed By: Clinician [name] Hurst, RN Compression Type: Three Layer Post Procedure Diagnosis Same as Pre-procedure Electronic Signature(s) Signed: 12/06/2020 5:23:58 PM By: [name] Hurst RN, BSN Entered By: [name] on 12/06/2020 13:23:01 -------------------------------------------------------------------------------- Compression Therapy Details Patient Name: Date of Service: [name], [name]. 12/06/2020 12:30 PM Medical Record Number: [id number] Patient Account Number: [id number] Date of Birth/Sex: Treating RN: 25-Apr-1925 (87 y.o. [name] Primary Care [name]: [name] Other Clinician: Referring [name]: Treating [name]/Extender: [name] in Treatment: [name] Compression Therapy Performed for Wound Assessment: Wound #9 Left,Distal,Posterior [name] Leg Performed By: Clinician [name] Hurst, RN Compression Type: Three Layer Post Procedure Diagnosis Same as  Pre-procedure Electronic Signature(s) Signed: 12/06/2020 5:23:58 PM By: [name] Hurst RN, BSN Entered By: [name] on 12/06/2020 13:23:01 -------------------------------------------------------------------------------- Encounter Discharge Information Details Patient Name: Date of Service: [name], [name]. 12/06/2020 12:30 PM Medical Record Number: [id number] Patient Account Number: [id number] Date of Birth/Sex: Treating RN: [birth year] (87 y.o. [name] Primary Care [name]: [name] Other Clinician: Referring Marjan Rosman: Treating [name] Encounter Discharge Information Items Discharge Condition: Stable Ambulatory Status: Walker Discharge Destination: Home Transportation: Private Auto Accompanied By: alone Schedule Follow-up Appointment: Yes Clinical Summary of  Care: Patient Declined Electronic Signature(s) Signed: 12/06/2020 5:23:58 PM By: [name] Hurst RN, BSN Entered By: [name] on 12/06/2020 14:41:23 -------------------------------------------------------------------------------- [name] Extremity Assessment Details Patient Name: Date of Service: [name], [name]. 12/06/2020 12:30 PM Medical Record Number: [id number] Patient Account Number: [id number] Date of Birth/Sex: Treating RN: 1925-04-30 (87 y.o. [name] Primary Care [name]: [name] Other Clinician: Referring [name]: Treating [name] Edema Assessment Assessed: [Left: No] [[name]: No] Edema: [Left: Yes] [[name]: Yes] Calf Left: [name]: Point of Measurement: 30 cm From Medial Instep 41 cm 41 cm Ankle Left: [name]: Point of Measurement: 9 cm From Medial Instep 24 cm 22 cm Vascular Assessment Pulses: Dorsalis Pedis Palpable: [Left:Yes] [[name]:Yes] Electronic Signature(s) Signed: 12/06/2020 5:23:58 PM By:  [name] Hurst RN, BSN Entered By: [name] on 12/06/2020 13:08:16 -------------------------------------------------------------------------------- Multi Wound Chart Details Patient Name: Date of Service: [name], [name]. 12/06/2020 12:30 PM Medical Record Number: [id number] Patient Account Number: [id number] Date of Birth/Sex: Treating RN: 1925-03-03 (87 y.o. F) Primary Care Roberth Berling: [name] Other Clinician: Referring Maryan Sivak: Treating Shivali Quackenbush/Extender: [name] in Treatment: [name] Vital Signs Height(in): 65 Pulse(bpm): 13 Weight(lbs): 163 Blood Pressure(mmHg): 104/58 Body Mass Index(BMI): 27 Temperature(F): 98.0 Respiratory Rate(breaths/min): 16 Photos: [name], Posterior [name] Leg Left, Anterior [name] Leg Left, Distal, Posterior [name] Leg Wound Location: Gradually Appeared Blister Gradually Appeared Wounding Event: Venous Leg Ulcer Malignant Wound Venous Leg Ulcer Primary Etiology: Anemia, Arrhythmia, Congestive Heart Anemia, Arrhythmia, Congestive Heart Anemia, Arrhythmia, Congestive Heart Comorbid History: Failure, Hypertension, Colitis, Failure, Hypertension, Colitis, Failure, Hypertension, Colitis, Osteoarthritis Osteoarthritis Osteoarthritis 11/08/2020 04/20/2020 10/04/2020 Date Acquired: 4 [name] 9  Weeks of Treatment: Open Open Open Wound Status: No Yes No Wound Recurrence: Yes No Yes Clustered Wound: 7 N/A 8 Clustered Quantity: 11x14x0.1 0.5x0.4x0.1 9.5x15.5x0.1 Measurements L x W x D (cm) 120.951 0.157 115.65 A (cm) : rea 12.095 0.016 11.565 Volume (cm) : -2980.00% 94.90% -61416.00% % Reduction in Area: -2977.60% 94.80% -60768.40% % Reduction in Volume: Full Thickness Without Exposed Full Thickness Without Exposed Full Thickness Without Exposed Classification: Support Structures Support Structures Support Structures Medium Medium Large Exudate Amount: Serosanguineous Serosanguineous  Serosanguineous Exudate Type: red, brown red, brown red, brown Exudate Color: Distinct, outline attached Distinct, outline attached Distinct, outline attached Wound Margin: Large (67-100%) Large (67-100%) Large (67-100%) Granulation Amount: Red, Pink Red Red, Pink Granulation Quality: None Present (0%) None Present (0%) None Present (0%) Necrotic Amount: Fat Layer (Subcutaneous Tissue): Yes Fat Layer (Subcutaneous Tissue): Yes Fat Layer (Subcutaneous Tissue): Yes Exposed Structures: Fascia: No Fascia: No Fascia: No Tendon: No Tendon: No Tendon: No Muscle: No Muscle: No Muscle: No Joint: No Joint: No Joint: No Bone: No Bone: No Bone: No None Medium (34-66%) Medium (34-66%) Epithelialization: Compression Therapy Compression Therapy Compression Therapy Procedures Performed: Treatment Notes Electronic Signature(s) Signed: 12/06/2020 1:40:29 PM By: Kalman Shan DO Entered By: Kalman Shan on 12/06/2020 13:32:07 -------------------------------------------------------------------------------- Multi-Disciplinary Care Plan Details Patient Name: Date of Service: [name], [name] 12/06/2020 12:30 PM Medical Record Number: [id number] Patient Account Number: [id number] Date of Birth/Sex: Treating RN: [birth year] (87 y.o. [name] Primary Care Oryan Winterton: [name] Other Clinician: Referring Zina Pitzer: Treating Yvetta Drotar/Extender: [name] in Treatment: [name] Multidisciplinary Care Plan reviewed with physician Active Inactive Venous Leg Ulcer Nursing Diagnoses: Knowledge deficit related to disease process and management Potential for venous Insuffiency (use before diagnosis confirmed) Goals: Patient will maintain optimal edema control Date Initiated: 11/01/2020  Target Resolution Date: 12/27/2020 Goal Status: Active Interventions: Assess peripheral edema status every visit. Compression as ordered Provide education  on venous insufficiency Treatment Activities: Therapeutic compression applied : 11/01/2020 Notes: Wound/Skin Impairment Nursing Diagnoses: Impaired tissue integrity Knowledge deficit related to ulceration/compromised skin integrity Goals: Patient/caregiver will verbalize understanding of skin care regimen Date Initiated: 06/11/2020 Target Resolution Date: 12/27/2020 Goal Status: Active Interventions: Assess patient/caregiver ability to obtain necessary supplies Assess patient/caregiver ability to perform ulcer/skin care regimen upon admission and as needed Assess ulceration(s) every visit Provide education on ulcer and skin care Notes: 10/11/20: Wound care regimen ongoing. Electronic Signature(s) Signed: 12/06/2020 5:23:58 PM By: [name] Hurst RN, BSN Entered By: [name] on 12/06/2020 13:18:53 -------------------------------------------------------------------------------- Pain Assessment Details Patient Name: Date of Service: [name], [place]. 12/06/2020 12:30 PM Medical Record Number: [id number] Patient Account Number: [id number] Date of Birth/Sex: Treating RN: 1925/07/02 (87 y.o. [name] Primary Care Sakib Noguez: [name] Other Clinician: Referring Reika Callanan: Treating Kenrick Pore/Extender: [name] in Treatment: [name] Active Problems Location of Pain Severity and Description of Pain Patient Has Paino No Site Locations Pain Management and Medication Current Pain Management: Electronic Signature(s) Signed: 12/06/2020 5:23:58 PM By: [name] Hurst RN, BSN Entered By: [name] on 12/06/2020 12:48:47 -------------------------------------------------------------------------------- Patient/Caregiver Education Details Patient Name: Date of Service: [name], Kaari A. 11/17/2022andnbsp12:30 PM Medical Record Number: [id number] Patient Account Number: [id number] Date of Birth/Gender: Treating RN: 1925/07/13 (87 y.o.  [name] Primary Care Physician: [name] Other Clinician: Referring Physician: Treating Physician/Extender: [name] in Treatment: [name] Education Assessment Education Provided To: Patient Education Topics Provided Venous: Methods: Explain/Verbal Responses: State content correctly Wound/Skin Impairment: Methods: Explain/Verbal Responses: State content correctly Electronic Signature(s) Signed: 12/06/2020 5:23:58 PM By: [name] Hurst RN, BSN Entered By: [name] on 12/06/2020 13:19:07 -------------------------------------------------------------------------------- Wound Assessment Details Patient Name: Date of Service: [name], [place]. 12/06/2020 12:30 PM Medical Record Number: [id number] Patient Account Number: [id number] Date of Birth/Sex: Treating RN: 1925/03/22 (87 y.o. [name] Primary Care Harlan Vinal: [name] Other Clinician: Referring Patriece Archbold: Treating Leauna Sharber/Extender: [name] in Treatment: [name] Wound Status Wound Number: 15 Primary Venous Leg Ulcer Etiology: Wound Location: [name], Posterior [name] Leg Wound Open Wounding Event: Gradually Appeared Status: Date Acquired: 11/08/2020 Comorbid Anemia, Arrhythmia, Congestive Heart Failure, Hypertension, Weeks Of Treatment: 4 History: Colitis, Osteoarthritis Clustered Wound: Yes Photos Wound Measurements Length: (cm) 11 Width: (cm) 14 Depth: (cm) 0.1 Clustered Quantity: 7 Area: (cm) 120.951 Volume: (cm) 12.095 % Reduction in Area: -2980% % Reduction in Volume: -2977.6% Epithelialization: None Tunneling: No Undermining: No Wound Description Classification: Full Thickness Without Exposed Support Structures Wound Margin: Distinct, outline attached Exudate Amount: Medium Exudate Type: Serosanguineous Exudate Color: red, brown Foul Odor After Cleansing: No Slough/Fibrino No Wound  Bed Granulation Amount: Large (67-100%) Exposed Structure Granulation Quality: Red, Pink Fascia Exposed: No Necrotic Amount: None Present (0%) Fat Layer (Subcutaneous Tissue) Exposed: Yes Tendon Exposed: No Muscle Exposed: No Joint Exposed: No Bone Exposed: No Treatment Notes Wound #15 ([name] Leg) Wound Laterality: [name], Posterior Cleanser Soap and Water Discharge Instruction: May shower and wash wound with dial antibacterial soap and water prior to dressing change. Wound Cleanser Discharge Instruction: Cleanse the wound with wound cleanser prior to applying a clean dressing using gauze sponges, not tissue or cotton balls. Peri-Wound Care Triamcinolone 15 (g) Discharge Instruction: In clinic only. Use triamcinolone 15 (g) in clinic only. Sween Lotion (Moisturizing lotion) Discharge Instruction: Apply  moisturizing lotion as directed Topical Primary Dressing KerraCel Ag Gelling Fiber Dressing, 4x5 in (silver alginate) Discharge Instruction: Apply silver alginate to wound bed as instructed Secondary Dressing Woven Gauze Sponge, Non-Sterile 4x4 in Discharge Instruction: Apply over primary dressing as directed. ABD Pad, 8x10 Discharge Instruction: Apply over primary dressing as directed. Secured With Compression Wrap ThreePress (3 layer compression wrap) Discharge Instruction: Apply three layer compression as directed. Compression Stockings Add-Ons Electronic Signature(s) Signed: 12/06/2020 5:23:58 PM By: [name] Hurst RN, BSN Entered By: [name] on 12/06/2020 13:04:55 -------------------------------------------------------------------------------- Wound Assessment Details Patient Name: Date of Service: [name], [place]. 12/06/2020 12:30 PM Medical Record Number: [id number] Patient Account Number: [id number] Date of Birth/Sex: Treating RN: [birth year] (87 y.o. [name] Primary Care Anthem Frazer: [name] Other Clinician: Referring  Sarabi Sockwell: Treating Krrish Freund/Extender: [name] in Treatment: [name] Wound Status Wound Number: 7R Primary Malignant Wound Etiology: Wound Location: Left, Anterior [name] Leg Wound Open Wounding Event: Blister Status: Date Acquired: 04/20/2020 Comorbid Anemia, Arrhythmia, Congestive Heart Failure, Hypertension, Weeks Of Treatment: [name] History: Colitis, Osteoarthritis Clustered Wound: No Photos Wound Measurements Length: (cm) 0.5 Width: (cm) 0.4 Depth: (cm) 0.1 Area: (cm) 0.157 Volume: (cm) 0.016 % Reduction in Area: 94.9% % Reduction in Volume: 94.8% Epithelialization: Medium (34-66%) Tunneling: No Undermining: No Wound Description Classification: Full Thickness Without Exposed Support Structures Wound Margin: Distinct, outline attached Exudate Amount: Medium Exudate Type: Serosanguineous Exudate Color: red, brown Wound Bed Granulation Amount: Large (67-100%) Granulation Quality: Red Necrotic Amount: None Present (0%) Foul Odor After Cleansing: No Slough/Fibrino No Exposed Structure Fascia Exposed: No Fat Layer (Subcutaneous Tissue) Exposed: Yes Tendon Exposed: No Muscle Exposed: No Joint Exposed: No Bone Exposed: No Treatment Notes Wound #7R ([name] Leg) Wound Laterality: Left, Anterior Cleanser Soap and Water Discharge Instruction: May shower and wash wound with dial antibacterial soap and water prior to dressing change. Wound Cleanser Discharge Instruction: Cleanse the wound with wound cleanser prior to applying a clean dressing using gauze sponges, not tissue or cotton balls. Peri-Wound Care Triamcinolone 15 (g) Discharge Instruction: In clinic only. Use triamcinolone 15 (g) in clinic only. Sween Lotion (Moisturizing lotion) Discharge Instruction: Apply moisturizing lotion as directed Topical Primary Dressing KerraCel Ag Gelling Fiber Dressing, 4x5 in (silver alginate) Discharge Instruction: Apply silver alginate to wound  bed as instructed Secondary Dressing Woven Gauze Sponge, Non-Sterile 4x4 in Discharge Instruction: Apply over primary dressing as directed. ABD Pad, 8x10 Discharge Instruction: Apply over primary dressing as directed. Secured With Compression Wrap ThreePress (3 layer compression wrap) Discharge Instruction: Apply three layer compression as directed. Compression Stockings Add-Ons Electronic Signature(s) Signed: 12/06/2020 5:23:58 PM By: [name] Hurst RN, BSN Entered By: [name] on 12/06/2020 13:05:23 -------------------------------------------------------------------------------- Wound Assessment Details Patient Name: Date of Service: [name], [place]. 12/06/2020 12:30 PM Medical Record Number: [id number] Patient Account Number: [id number] Date of Birth/Sex: Treating RN: [birth year] (87 y.o. [name] Primary Care Satina Jerrell: [name] Other Clinician: Referring Kylynn Street: Treating Quinnley Colasurdo/Extender: [name] in Treatment: [name] Wound Status Wound Number: 9 Primary Venous Leg Ulcer Etiology: Wound Location: Left, Distal, Posterior [name] Leg Wound Open Wounding Event: Gradually Appeared Status: Date Acquired: 10/04/2020 Comorbid Anemia, Arrhythmia, Congestive Heart Failure, Hypertension, Weeks Of Treatment: 9 Weeks Of Treatment: 9 History: Colitis, Osteoarthritis Clustered Wound: Yes Photos Wound Measurements Length: (cm) 9.5 Width: (cm) 15.5 Depth: (cm) 0.1 Clustered Quantity: 8 Area: (cm) 115.65 Volume: (cm) 11.565 % Reduction in Area: -61416% % Reduction in Volume: -60768.4% Epithelialization: Medium (  34-66%) Tunneling: No Undermining: No Wound Description Classification: Full Thickness Without Exposed Support Structures Wound Margin: Distinct, outline attached Exudate Amount: Large Exudate Type: Serosanguineous Exudate Color: red, brown Foul Odor After Cleansing: No Slough/Fibrino Yes Wound  Bed Granulation Amount: Large (67-100%) Exposed Structure Granulation Quality: Red, Pink Fascia Exposed: No Necrotic Amount: None Present (0%) Fat Layer (Subcutaneous Tissue) Exposed: Yes Tendon Exposed: No Muscle Exposed: No Joint Exposed: No Bone Exposed: No Treatment Notes Wound #9 ([name] Leg) Wound Laterality: Left, Posterior, Distal Cleanser Soap and Water Discharge Instruction: May shower and wash wound with dial antibacterial soap and water prior to dressing change. Wound Cleanser Discharge Instruction: Cleanse the wound with wound cleanser prior to applying a clean dressing using gauze sponges, not tissue or cotton balls. Peri-Wound Care Triamcinolone 15 (g) Discharge Instruction: In clinic only. Use triamcinolone 15 (g) in clinic only. Sween Lotion (Moisturizing lotion) Discharge Instruction: Apply moisturizing lotion as directed Topical Primary Dressing KerraCel Ag Gelling Fiber Dressing, 4x5 in (silver alginate) Discharge Instruction: Apply silver alginate to wound bed as instructed Secondary Dressing Woven Gauze Sponge, Non-Sterile 4x4 in Discharge Instruction: Apply over primary dressing as directed. ABD Pad, 8x10 Discharge Instruction: Apply over primary dressing as directed. Secured With Compression Wrap ThreePress (3 layer compression wrap) Discharge Instruction: Apply three layer compression as directed. Compression Stockings Add-Ons Electronic Signature(s) Signed: 12/06/2020 5:23:58 PM By: [name] Hurst RN, BSN Entered By: [name] on 12/06/2020 13:07:45 -------------------------------------------------------------------------------- [place] Details Patient Name: Date of Service: [name], [place]. 12/06/2020 12:30 PM Medical Record Number: [id number] Patient Account Number: [id number] Date of Birth/Sex: Treating RN: [birth year] (87 y.o. [name] Primary Care Shi Blankenship: [name] Other Clinician: Referring Iya Hamed: Treating  Karson Reede/Extender: [name] in Treatment: [name] Vital Signs Time Taken: 12:45 Temperature (F): 98.0 Height (in): 65 Pulse (bpm): 56 Weight (lbs): 163 Respiratory Rate (breaths/min): 16 Body Mass Index (BMI): 27.1 Blood Pressure (mmHg): 104/58 Reference Range: 80 - 120 mg / dl Electronic Signature(s) Signed: 12/06/2020 5:23:58 PM By: [name] Hurst RN, BSN Entered By: [name] on 12/06/2020 12:48:02

## 2020-12-07 MED ORDER — FUROSEMIDE 40 MG PO TABS: 40.0000 mg | ORAL_TABLET | Freq: Every day | ORAL | 5 refills | Status: DC | PRN

## 2020-12-07 MED ORDER — POTASSIUM CHLORIDE CRYS ER 20 MEQ PO TBCR: 20.0000 meq | EXTENDED_RELEASE_TABLET | Freq: Every day | ORAL | 3 refills | Status: DC

## 2020-12-07 NOTE — Telephone Encounter (Signed)
Yes please.  Take furosemide 40 mg daily as needed leg swelling.  Thanks

## 2020-12-07 NOTE — Telephone Encounter (Signed)
Notified pt daughter w/ MD response. Daughter states wound Dr. Quintella Baton she need to keep a close look out on her potassium when taking the furosemide. Inform daughter pr chart cancer provider check potassium and it was in normal range , but will send klor con to [facility] to take as need w/ lasix.Marland KitchenJohny Chess

## 2020-12-12 ENCOUNTER — Encounter (HOSPITAL_BASED_OUTPATIENT_CLINIC_OR_DEPARTMENT_OTHER): Payer: Medicare Other | Admitting: Physician Assistant

## 2020-12-12 ENCOUNTER — Other Ambulatory Visit: Payer: Self-pay

## 2020-12-12 DIAGNOSIS — I872 Venous insufficiency (chronic) (peripheral): Secondary | ICD-10-CM | POA: Diagnosis not present

## 2020-12-12 DIAGNOSIS — Z85828 Personal history of other malignant neoplasm of skin: Secondary | ICD-10-CM | POA: Diagnosis not present

## 2020-12-12 DIAGNOSIS — L97829 Non-pressure chronic ulcer of other part of left lower leg with unspecified severity: Secondary | ICD-10-CM | POA: Diagnosis not present

## 2020-12-12 DIAGNOSIS — L97822 Non-pressure chronic ulcer of other part of left lower leg with fat layer exposed: Secondary | ICD-10-CM | POA: Diagnosis not present

## 2020-12-12 DIAGNOSIS — L97819 Non-pressure chronic ulcer of other part of right lower leg with unspecified severity: Secondary | ICD-10-CM | POA: Diagnosis not present

## 2020-12-12 DIAGNOSIS — L97212 Non-pressure chronic ulcer of right calf with fat layer exposed: Secondary | ICD-10-CM | POA: Diagnosis not present

## 2020-12-12 NOTE — Progress Notes (Signed)
[name] ([id number]) , Visit Report for 12/12/2020 Arrival Information Details Patient Name: Date of Service: [name], [place]. 12/12/2020 11:15 A M Medical Record Number: [id number] Patient Account Number: [id number] Date of Birth/Sex: Treating RN: [birth year] (87 y.o. [name], [name] Primary Care Janeann Paisley: [name] Other Clinician: Referring Chijioke Lasser: Treating Clessie Karras/Extender: Charlestine Massed in Treatment: 26 Visit Information History Since Last Visit Added or deleted any medications: No Patient Arrived: [place] Any new allergies or adverse reactions: No Arrival Time: 11:39 Had a fall or experienced change in No Accompanied By: transport activities of daily living that may affect Transfer Assistance: Manual risk of falls: Patient Identification Verified: Yes Signs or symptoms of abuse/neglect since last visito No Secondary Verification Process Completed: Yes Hospitalized since last visit: No Patient Requires Transmission-Based No Implantable device outside of the clinic excluding No Precautions: cellular tissue based products placed in the center Patient Has Alerts: Yes since last visit: Patient Alerts: R ABI = Non compressible Has Dressing in Place as Prescribed: Yes L ABI = Non Has Compression in Place as Prescribed: Yes compressible Pain Present Now: No Electronic Signature(s) Signed: 12/12/2020 3:00:15 PM By: Rhae Hammock RN Entered By: Rhae Hammock on 12/12/2020 11:39:40 -------------------------------------------------------------------------------- Compression Therapy Details Patient Name: Date of Service: [name], [place]. 12/12/2020 11:15 A M Medical Record Number: [id number] Patient Account Number: [id number] Date of Birth/Sex: Treating RN: 21-Mar-1925 (87 y.o. [name] Primary Care Kamla Skilton: [name] Other Clinician: Referring Elease Swarm: Treating Welborn Keena/Extender: Charlestine Massed in Treatment: 26 Compression Therapy Performed for Wound Assessment: Wound #15 Right,Posterior Lower Leg Performed By: Clinician Rhae Hammock, RN Compression Type: Three Layer Post Procedure Diagnosis Same as Pre-procedure Electronic Signature(s) Signed: 12/12/2020 3:00:15 PM By: Rhae Hammock RN Entered By: Rhae Hammock on 12/12/2020 12:09:35 -------------------------------------------------------------------------------- Compression Therapy Details Patient Name: Date of Service: [name], [place]. 12/12/2020 11:15 A M Medical Record Number: [id number] Patient Account Number: [id number] Date of Birth/Sex: Treating RN: 06/06/1925 (87 y.o. [name] Primary Care Walsie Smeltz: [name] Other Clinician: Referring Suzetta Timko: Treating Rian Koon/Extender: Charlestine Massed in Treatment: 26 Compression Therapy Performed for Wound Assessment: Wound #7R Left,Anterior,Circumferential Lower Leg Performed By: Clinician Rhae Hammock, RN Compression Type: Three Layer Post Procedure Diagnosis Same as Pre-procedure Electronic Signature(s) Signed: 12/12/2020 3:00:15 PM By: Rhae Hammock RN Entered By: Rhae Hammock on 12/12/2020 12:09:53 -------------------------------------------------------------------------------- Encounter Discharge Information Details Patient Name: Date of Service: [name], [place] 12/12/2020 11:15 A M Medical Record Number: [id number] Patient Account Number: [id number] Date of Birth/Sex: Treating RN: 07-11-1925 (87 y.o. [name], [name] Primary Care Caylor Cerino: [name] Other Clinician: Referring Decorey Wahlert: Treating Yunus Stoklosa/Extender: Guinevere Scarlet, Vivi Ferns in Treatment: 32 Encounter Discharge Information Items Discharge Condition: Stable Ambulatory Status: Ambulatory Discharge Destination: Home Transportation: Private  Auto Accompanied By: transport Schedule Follow-up Appointment: Yes Clinical Summary of Care: Patient Declined Electronic Signature(s) Signed: 12/12/2020 3:00:15 PM By: Rhae Hammock RN Entered By: Rhae Hammock on 12/12/2020 12:12:09 -------------------------------------------------------------------------------- Lower Extremity Assessment Details Patient Name: Date of Service: [name], [place]. 12/12/2020 11:15 A M Medical Record Number: [id number] Patient Account Number: [id number] Date of Birth/Sex: Treating RN: Nov 17, 1925 (87 y.o. [name], [name] Primary Care Kinslea Frances: [name] Other Clinician: Referring Aasia Peavler: Treating Marieliz Strang/Extender: Guinevere Scarlet, [name] Weeks in Treatment: 26 Edema Assessment Assessed: [Left: No] [Right: No] Edema: [Left: Yes] [Right: Yes] Calf Left: Right: Point of  Measurement: 30 cm From Medial Instep 33 cm 33 cm Ankle Left: Right: Point of Measurement: 9 cm From Medial Instep 23.5 cm 22 cm Vascular Assessment Pulses: Dorsalis Pedis Palpable: [Left:Yes] [Right:Yes] Posterior Tibial Palpable: [Left:Yes] [Right:Yes] Electronic Signature(s) Signed: 12/12/2020 3:00:15 PM By: Rhae Hammock RN Entered By: Rhae Hammock on 12/12/2020 11:41:50 -------------------------------------------------------------------------------- Multi-Disciplinary Care Plan Details Patient Name: Date of Service: [name], [place]. 12/12/2020 11:15 A M Medical Record Number: [id number] Patient Account Number: [id number] Date of Birth/Sex: Treating RN: 09/27/1925 (87 y.o. [name], [name] Primary Care Josedaniel Haye: [name] Other Clinician: Referring Zariah Jost: Treating Lianette Broussard/Extender: Charlestine Massed in Treatment: [place] reviewed with physician Active Inactive Venous Leg Ulcer Nursing Diagnoses: Knowledge deficit related to disease process and  management Potential for venous Insuffiency (use before diagnosis confirmed) Goals: Patient will maintain optimal edema control Date Initiated: 11/01/2020 Target Resolution Date: 12/27/2020 Goal Status: Active Interventions: Assess peripheral edema status every visit. Compression as ordered Provide education on venous insufficiency Treatment Activities: Therapeutic compression applied : 11/01/2020 Notes: Wound/Skin Impairment Nursing Diagnoses: Impaired tissue integrity Knowledge deficit related to ulceration/compromised skin integrity Goals: Patient/caregiver will verbalize understanding of skin care regimen Date Initiated: 06/11/2020 Target Resolution Date: 12/27/2020 Goal Status: Active Interventions: Assess patient/caregiver ability to obtain necessary supplies Assess patient/caregiver ability to perform ulcer/skin care regimen upon admission and as needed Assess ulceration(s) every visit Provide education on ulcer and skin care Notes: 10/11/20: Wound care regimen ongoing. Electronic Signature(s) Signed: 12/12/2020 3:00:15 PM By: Rhae Hammock RN Entered By: Rhae Hammock on 12/12/2020 12:10:41 -------------------------------------------------------------------------------- Pain Assessment Details Patient Name: Date of Service: [name], [place] 12/12/2020 11:15 A M Medical Record Number: [id number] Patient Account Number: [id number] Date of Birth/Sex: Treating RN: 1925/02/05 (87 y.o. [name], [name] Primary Care Kamoni Gentles: [name] Other Clinician: Referring Ariabella Brien: Treating Annikah Lovins/Extender: Charlestine Massed in Treatment: 26 Active Problems Location of Pain Severity and Description of Pain Patient Has Paino No Site Locations Pain Management and Medication Current Pain Management: Electronic Signature(s) Signed: 12/12/2020 3:00:15 PM By: Rhae Hammock RN Entered By: Rhae Hammock on 12/12/2020  11:41:05 -------------------------------------------------------------------------------- Patient/Caregiver Education Details Patient Name: Date of Service: [name], [place]. 11/23/2022andnbsp11:15 A M Medical Record Number: [id number] Patient Account Number: [id number] Date of Birth/Gender: Treating RN: 11/18/1925 (87 y.o. [name], [name] Primary Care Physician: [name] Other Clinician: Referring Physician: Treating Physician/Extender: Charlestine Massed in Treatment: 40 Education Assessment Education Provided To: Patient Education Topics Provided Venous: Methods: Explain/Verbal Responses: State content correctly Wound/Skin Impairment: Methods: Explain/Verbal Responses: State content correctly Electronic Signature(s) Signed: 12/12/2020 3:00:15 PM By: Rhae Hammock RN Entered By: Rhae Hammock on 12/12/2020 12:10:59 -------------------------------------------------------------------------------- Wound Assessment Details Patient Name: Date of Service: [name], [place]. 12/12/2020 11:15 A M Medical Record Number: [id number] Patient Account Number: [id number] Date of Birth/Sex: Treating RN: 04/18/1925 (87 y.o. [name], [name] Primary Care Alsie Younes: [name] Other Clinician: Referring Dima Mini: Treating Renaye Janicki/Extender: Charlestine Massed in Treatment: 26 Wound Status Wound Number: 15 Primary Venous Leg Ulcer Etiology: Wound Location: Right, Posterior Lower Leg Wound Open Wounding Event: Gradually Appeared Status: Date Acquired: 11/08/2020 Comorbid Anemia, Arrhythmia, Congestive Heart Failure, Hypertension, Weeks Of Treatment: 4 History: Colitis, Osteoarthritis Clustered Wound: Yes Photos Wound Measurements Length: (cm) 4.5 Width: (cm) 10.5 Depth: (cm) 0.1 Clustered Quantity: 7 Area: (cm) 37.11 Volume: (cm) 3.711 Wound Description Classification: Full Thickness  Without Exposed Support  Structu Wound Margin: Distinct, outline attached Exudate Amount: Medium Exudate Type: Serosanguineous Exudate Color: red, brown Foul Odor After Cleansing: Slough/Fibrino % Reduction in Area: -845% % Reduction in Volume: -844.3% Epithelialization: None Tunneling: No Undermining: No res No No Wound Bed Granulation Amount: Large (67-100%) Exposed Structure Granulation Quality: Red, Pink Fascia Exposed: No Necrotic Amount: None Present (0%) Fat Layer (Subcutaneous Tissue) Exposed: Yes Tendon Exposed: No Muscle Exposed: No Joint Exposed: No Bone Exposed: No Treatment Notes Wound #15 (Lower Leg) Wound Laterality: Right, Posterior Cleanser Soap and Water Discharge Instruction: May shower and wash wound with dial antibacterial soap and water prior to dressing change. Wound Cleanser Discharge Instruction: Cleanse the wound with wound cleanser prior to applying a clean dressing using gauze sponges, not tissue or cotton balls. Peri-Wound Care Triamcinolone 15 (g) Discharge Instruction: In clinic only. Use triamcinolone 15 (g) in clinic only. Sween Lotion (Moisturizing lotion) Discharge Instruction: Apply moisturizing lotion as directed Topical Primary Dressing KerraCel Ag Gelling Fiber Dressing, 4x5 in (silver alginate) Discharge Instruction: Apply silver alginate to wound bed as instructed Secondary Dressing Woven Gauze Sponge, Non-Sterile 4x4 in Discharge Instruction: Apply over primary dressing as directed. ABD Pad, 8x10 Discharge Instruction: Apply over primary dressing as directed. Secured With Compression Wrap ThreePress (3 layer compression wrap) Discharge Instruction: Apply three layer compression as directed. Compression Stockings Add-Ons Electronic Signature(s) Signed: 12/12/2020 3:00:15 PM By: Rhae Hammock RN Signed: 12/12/2020 5:44:39 PM By: Levan Hurst RN, BSN Entered By: Levan Hurst on 12/12/2020  11:46:14 -------------------------------------------------------------------------------- Wound Assessment Details Patient Name: Date of Service: [name], [place]. 12/12/2020 11:15 A M Medical Record Number: [id number] Patient Account Number: [id number] Date of Birth/Sex: Treating RN: 05/03/1925 (87 y.o. [name] Primary Care Mallery Harshman: [name] Other Clinician: Referring Alfonso Carden: Treating Jayshawn Colston/Extender: Charlestine Massed in Treatment: 26 Wound Status Wound Number: 7R Primary Malignant Wound Etiology: Wound Location: Left, Anterior, Circumferential Lower Leg Wound Open Wounding Event: Blister Status: Date Acquired: 04/20/2020 Comorbid Anemia, Arrhythmia, Congestive Heart Failure, Hypertension, Weeks Of Treatment: 26 History: Colitis, Osteoarthritis Clustered Wound: No Photos Wound Measurements Length: (cm) 12.5 Width: (cm) 22 Depth: (cm) 0.1 Area: (cm) 215.984 Volume: (cm) 21.598 % Reduction in Area: -6933% % Reduction in Volume: -6935.2% Epithelialization: Medium (34-66%) Tunneling: No Undermining: No Wound Description Classification: Full Thickness Without Exposed Support Structures Wound Margin: Distinct, outline attached Exudate Amount: Medium Exudate Type: Serosanguineous Exudate Color: red, brown Foul Odor After Cleansing: No Slough/Fibrino No Wound Bed Granulation Amount: Large (67-100%) Exposed Structure Granulation Quality: Red Fascia Exposed: No Necrotic Amount: None Present (0%) Fat Layer (Subcutaneous Tissue) Exposed: Yes Tendon Exposed: No Muscle Exposed: No Joint Exposed: No Bone Exposed: No Treatment Notes Wound #7R (Lower Leg) Wound Laterality: Left, Anterior, Circumferential Cleanser Soap and Water Discharge Instruction: May shower and wash wound with dial antibacterial soap and water prior to dressing change. Wound Cleanser Discharge Instruction: Cleanse the wound with wound cleanser  prior to applying a clean dressing using gauze sponges, not tissue or cotton balls. Peri-Wound Care Triamcinolone 15 (g) Discharge Instruction: In clinic only. Use triamcinolone 15 (g) in clinic only. Sween Lotion (Moisturizing lotion) Discharge Instruction: Apply moisturizing lotion as directed Topical Primary Dressing KerraCel Ag Gelling Fiber Dressing, 4x5 in (silver alginate) Discharge Instruction: Apply silver alginate to wound bed as instructed Secondary Dressing Woven Gauze Sponge, Non-Sterile 4x4 in Discharge Instruction: Apply over primary dressing as directed. ABD Pad, 8x10 Discharge Instruction: Apply over primary dressing as directed. Secured With Compression Wrap ThreePress (3  layer compression wrap) Discharge Instruction: Apply three layer compression as directed. Compression Stockings Add-Ons Electronic Signature(s) Signed: 12/12/2020 5:44:39 PM By: Levan Hurst RN, BSN Entered By: Levan Hurst on 12/12/2020 11:46:54 -------------------------------------------------------------------------------- [place] Details Patient Name: Date of Service: [name], [place]. 12/12/2020 11:15 A M Medical Record Number: [id number] Patient Account Number: [id number] Date of Birth/Sex: Treating RN: January 01, 1926 (87 y.o. [name], [name] Primary Care Latanga Nedrow: [name] Other Clinician: Referring Biana Haggar: Treating Esti Demello/Extender: Charlestine Massed in Treatment: 26 Vital Signs Time Taken: 11:39 Temperature (F): 97.4 Height (in): 65 Pulse (bpm): 74 Weight (lbs): 163 Respiratory Rate (breaths/min): 17 Body Mass Index (BMI): 27.1 Blood Pressure (mmHg): 147/88 Reference Range: 80 - 120 mg / dl Electronic Signature(s) Signed: 12/12/2020 3:00:15 PM By: Rhae Hammock RN Entered By: Rhae Hammock on 12/12/2020 11:40:41

## 2020-12-12 NOTE — Progress Notes (Addendum)
[name] ([id number]) , Visit Report for 12/12/2020 Chief Complaint Document Details Patient Name: Date of Service: [name], [place] 12/12/2020 11:15 A M Medical Record Number: [id number] Patient Account Number: [id number] Date of Birth/Sex: Treating RN: 05-29-1925 (87 y.o. Elam Dutch Primary Care Provider: [name] Other Clinician: Referring Provider: Treating Provider/Extender: Charlestine Massed in Treatment: 69 Information Obtained from: Patient Chief Complaint Bilateral lower extremity wounds that have been biopsied and positive for squamous cell carcinoma Electronic Signature(s) Signed: 12/12/2020 11:58:28 AM By: Worthy Keeler PA-C Entered By: Worthy Keeler on 12/12/2020 11:58:28 -------------------------------------------------------------------------------- HPI Details Patient Name: Date of Service: [name], [name] 12/12/2020 11:15 A M Medical Record Number: [id number] Patient Account Number: [id number] Date of Birth/Sex: Treating RN: 1925-10-29 (87 y.o. Elam Dutch Primary Care Provider: [name] Other Clinician: Referring Provider: Treating Provider/Extender: Charlestine Massed in Treatment: 26 History of Present Illness Location: left leg HPI Description: Admission 5/23 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 She states that the wound on the right has been present for a year. The 1 on the left has opened up 1 month ago. She is followed with oncology for this issue as she had biopsies that showed squamous cell carcinoma. She is also seeing radiation oncology for treatment options. She presents today because she would like for her wounds to  be healed by [place]. She currently denies signs of infection. 6/1; patient presents for 1 week follow-up. She states she has tolerated the leg wraps well. She states these do not bother her and is happy to continue with them. She is scheduled to see her oncologist today to go over treatment options for the bilateral lower extremity squamous cell carcinoma. Radiation is currently not a recommended option. Patient states she overall feels well. 6/22; patient presents for 3-week follow-up. She has tolerated the wraps well until her last wrap where she states they were uncomfortable. She attributes this to the home health nurse. She denies signs of infection. She has started her first treatment of antibody infusions for her Bilateral lower extremity squamous cell carcinoma. She has no complaints today. 7/21; patient presents for 1 month follow-up. Unfortunately she has not had good experience with her wrap changes with home health. She would like to do her own dressing changes. She continues to do her antibody infusions. She denies signs of infection. 7/28; patient presents for 1 week follow-up. At last clinic visit she was switched to daily dressing changes due to issues with the wrap and home health placing them. Unfortunately she has developed weeping to her legs bilaterally. She would like to be placed in wraps today. She would also like to follow with [place] weekly for wrap changes instead of having home health change them. She denies signs of infection. 8/4; patient presents for 1 week follow-up. She has tolerated the Kerlix/Coban wraps well. She no longer has weeping to her legs. She took the wrap off 1 day before coming in to be able to take a shower. She has no issues or complaints today. She denies signs of infection. 8/18; patient presents for follow-up. Patient has tolerated the wraps well. She brought her Velcro compression wraps today. She has no issues or complaints today.  She had her chemotherapy  infusion yesterday without issues. She denies signs of infection. 8/25; patient presents for follow-up. She used her juxta lite compressions for the past week. It is unclear if she is able to put these on correctly since she states she has a hard time getting them to look right. She reports 2 open wounds. She currently denies signs of infection. 9/1; patient presents for 1 week follow-up. She has 1 open wound. She tolerated the compression wraps well. She currently denies signs of infection. 9/8; patient presents for 1 week follow-up. She has 2 open wounds 1 on each leg. She has tolerated the compression wrap well. She currently denies signs of infection. 9/15; patient presents for 1 week follow-up. She now has 3 wounds. 2 on the left and 1 on the right. She continues to tolerate the compression wrap well. She currently denies signs of infection. She has obtained furosemide by her primary care physician and would like to discuss when to take this. 9/22; patient presents for 1 week follow-up. She has scattered wounds on her lower extremities bilaterally. She did take furosemide twice in the past week. She does not recall having to urinate more frequently. She tolerated the 3 layer compression well. She denies signs of infection. 10/13; patient has not been here recently because of [place]. Apparently the facility was only putting gauze on her legs. This is a patient I do not normally see. 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 Ronnald Ramp at [facility] dermatology. She is quite convincing that she did not have radiation to her lower legs. It is likely she also has significant chronic venous insufficiency stasis dermatitis. We have been using silver alginate under kerlix Coban. She has obvious open areas medially on the right and areas on the left. She also has areas of extensive dry flaking adherent areas on the right and to a  lesser extent on the left anterior. Nodular areas on the left lateral lower leg left posterior calf and right mid calf medially. 10/21; patient presents for follow-up. She has no issues or complaints today. She has tolerated the 3 layer compression well bilaterally. She denies signs of infection. 10/27; patient presents for follow-up. She continues to tolerate the 3 layer compression wrap well. She denies signs of infection. 11/30; patient presents for follow-up. She has no issues or complaints today. 11/10; patient arrived in clinic today for nurse visit accompanied by her daughter from [place]. The daughter had multiple questions so we turned this into doctors visit. Apparently after the last time I saw this woman in October she went to see Dr. Ronnald Ramp but he did not take the wraps off. She previously was treated with Cemiplimab for her squamous cell carcinoma. She did not receive radiation to her legs. I had wanted Dr. Ronnald Ramp to look at this because of the exceptionally damaged skin on her lower legs bilaterally. She has odd looking wounds on the left medial lower leg also extending posteriorly which we have not made a lot of progress on. But she also has a raised hyperkeratotic nodules on the right anterior tibial area plaques on the left medial lower thigh. These are not areas that are under compression. We have been using silver alginate on any open areas Liberal TCA under 3 layer compression. 11/17; patient presents for follow-up. She had her wraps taken off yesterday for her dermatology appointment. She reports excessive weeping to her  legs bilaterally after the wraps were taken off. She currently denies signs of infection. 12/12/2020 upon evaluation today patient appears to be doing about the same in regard to her wounds. She was actually started on Cipro after having biopsies apparently at her dermatology clinic she does not have the results back from the actual biopsy but the culture did return  and apparently they placed her on the Cipro she started that just this morning. Other than that her legs appear to be doing okay at this time. Electronic Signature(s) Signed: 12/12/2020 12:49:24 PM By: Worthy Keeler PA-C Entered By: Worthy Keeler on 12/12/2020 12:49:24 -------------------------------------------------------------------------------- Physical Exam Details Patient Name: Date of Service: [name], [place]. 12/12/2020 11:15 A M Medical Record Number: [id number] Patient Account Number: [id number] Date of Birth/Sex: Treating RN: 02/05/1925 (87 y.o. Elam Dutch Primary Care Provider: [name] Other Clinician: Referring Provider: Treating Provider/Extender: Guinevere Scarlet, Vivi Ferns in Treatment: 42 Constitutional Well-nourished and well-hydrated in no acute distress. Respiratory normal breathing without difficulty. Psychiatric this patient is able to make decisions and demonstrates good insight into disease process. Alert and Oriented x 3. pleasant and cooperative. Notes Upon inspection patient's wounds are actually showing signs of improvement overall I do not see any really major or large open/draining areas but her legs in general do appear to need some work as far as some of the dry skin which is now wet is concerned. I did actually try to get some of this off with just using the thin cardboard measuring sticks. I was able to get some of it away hopefully this should help to some degree. Nonetheless in general I think it is good to take some time to try to get things under control here. Electronic Signature(s) Signed: 12/12/2020 12:49:57 PM By: Worthy Keeler PA-C Entered By: Worthy Keeler on 12/12/2020 12:49:57 -------------------------------------------------------------------------------- Physician Orders Details Patient Name: Date of Service: [name], [place]. 12/12/2020 11:15 A M Medical Record Number: [id number] Patient  Account Number: [id number] Date of Birth/Sex: Treating RN: 1925/03/23 (87 y.o. [name], [name] Primary Care Provider: [name] Other Clinician: Referring Provider: Treating Provider/Extender: Charlestine Massed in Treatment: 51 Verbal / Phone Orders: No Diagnosis Coding ICD-10 Coding Code Description C44.92 Squamous cell carcinoma of skin, unspecified L97.819 Non-pressure chronic ulcer of other part of right lower leg with unspecified severity L97.829 Non-pressure chronic ulcer of other part of left lower leg with unspecified severity I87.2 Venous insufficiency (chronic) (peripheral) Follow-up Appointments ppointment in 1 week. - with Dr. [place] Return A Bathing/ Shower/ Hygiene May shower with protection but do not get wound dressing(s) wet. - Use cast protector Edema Control - Lymphedema / SCD / Other Elevate legs to the level of the heart or above for 30 minutes daily and/or when sitting, a frequency of: - elevate the legs throughout the day heart level if possible. Avoid standing for long periods of time. Exercise regularly Additional Orders / Instructions Follow Nutritious Diet Wound Treatment Wound #15 - Lower Leg Wound Laterality: Right, Posterior Cleanser: Soap and Water 1 x Per Week/15 Days Discharge Instructions: May shower and wash wound with dial antibacterial soap and water prior to dressing change. Cleanser: Wound Cleanser 1 x Per Week/15 Days Discharge Instructions: Cleanse the wound with wound cleanser prior to applying a clean dressing using gauze sponges, not tissue or cotton balls. Peri-Wound Care: Triamcinolone 15 (g) 1 x Per Week/15 Days Discharge Instructions: In clinic only.  Use triamcinolone 15 (g) in clinic only. Peri-Wound Care: Sween Lotion (Moisturizing lotion) 1 x Per Week/15 Days Discharge Instructions: Apply moisturizing lotion as directed Prim Dressing: KerraCel Ag Gelling Fiber Dressing, 4x5 in (silver  alginate) 1 x Per Week/15 Days ary Discharge Instructions: Apply silver alginate to wound bed as instructed Secondary Dressing: Woven Gauze Sponge, Non-Sterile 4x4 in 1 x Per Week/15 Days Discharge Instructions: Apply over primary dressing as directed. Secondary Dressing: ABD Pad, 8x10 1 x Per Week/15 Days Discharge Instructions: Apply over primary dressing as directed. Compression Wrap: ThreePress (3 layer compression wrap) 1 x Per Week/15 Days Discharge Instructions: Apply three layer compression as directed. Wound #7R - Lower Leg Wound Laterality: Left, Anterior, Circumferential Cleanser: Soap and Water 1 x Per Week/15 Days Discharge Instructions: May shower and wash wound with dial antibacterial soap and water prior to dressing change. Cleanser: Wound Cleanser 1 x Per Week/15 Days Discharge Instructions: Cleanse the wound with wound cleanser prior to applying a clean dressing using gauze sponges, not tissue or cotton balls. Peri-Wound Care: Triamcinolone 15 (g) 1 x Per Week/15 Days Discharge Instructions: In clinic only. Use triamcinolone 15 (g) in clinic only. Peri-Wound Care: Sween Lotion (Moisturizing lotion) 1 x Per Week/15 Days Discharge Instructions: Apply moisturizing lotion as directed Prim Dressing: KerraCel Ag Gelling Fiber Dressing, 4x5 in (silver alginate) 1 x Per Week/15 Days ary Discharge Instructions: Apply silver alginate to wound bed as instructed Secondary Dressing: Woven Gauze Sponge, Non-Sterile 4x4 in 1 x Per Week/15 Days Discharge Instructions: Apply over primary dressing as directed. Secondary Dressing: ABD Pad, 8x10 1 x Per Week/15 Days Discharge Instructions: Apply over primary dressing as directed. Compression Wrap: ThreePress (3 layer compression wrap) 1 x Per Week/15 Days Discharge Instructions: Apply three layer compression as directed. Electronic Signature(s) Signed: 12/12/2020 3:00:15 PM By: Rhae Hammock RN Signed: 12/12/2020 5:33:12 PM By: Worthy Keeler PA-C Entered By: Rhae Hammock on 12/12/2020 12:10:28 -------------------------------------------------------------------------------- Problem List Details Patient Name: Date of Service: [name], [place]. 12/12/2020 11:15 A M Medical Record Number: [id number] Patient Account Number: [id number] Date of Birth/Sex: Treating RN: 04/15/1925 (87 y.o. Elam Dutch Primary Care Provider: [name] Other Clinician: Referring Provider: Treating Provider/Extender: Charlestine Massed in Treatment: 26 Active Problems ICD-10 Encounter Code Description Active Date MDM Diagnosis C44.92 Squamous cell carcinoma of skin, unspecified 06/11/2020 No Yes L97.819 Non-pressure chronic ulcer of other part of right lower leg with unspecified 06/11/2020 No Yes severity L97.829 Non-pressure chronic ulcer of other part of left lower leg with unspecified 06/11/2020 No Yes severity I87.2 Venous insufficiency (chronic) (peripheral) 06/11/2020 No Yes Inactive Problems Resolved Problems Electronic Signature(s) Signed: 12/12/2020 11:58:21 AM By: Worthy Keeler PA-C Entered By: Worthy Keeler on 12/12/2020 11:58:21 -------------------------------------------------------------------------------- Progress Note Details Patient Name: Date of Service: [name], [name] 12/12/2020 11:15 A M Medical Record Number: [id number] Patient Account Number: [id number] Date of Birth/Sex: Treating RN: 24-Feb-1925 (87 y.o. Elam Dutch Primary Care Provider: [name] Other Clinician: Referring Provider: Treating Provider/Extender: Charlestine Massed in Treatment: 29 Subjective Chief Complaint Information obtained from Patient Bilateral lower extremity wounds that have been biopsied and positive for squamous cell carcinoma History of Present Illness (HPI) The following HPI elements were documented for the patient's wound: Location:  left leg Admission 5/23 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 She states that the wound on the right has been present for a year. The 1 on the left has opened up 1 month ago. She is followed with oncology for this issue as she had biopsies that showed squamous cell carcinoma. She is also seeing radiation oncology for treatment options. She presents today because she would like for her wounds to be healed by [place]. She currently denies signs of infection. 6/1; patient presents for 1 week follow-up. She states she has tolerated the leg wraps well. She states these do not bother her and is happy to continue with them. She is scheduled to see her oncologist today to go over treatment options for the bilateral lower extremity squamous cell carcinoma. Radiation is currently not a recommended option. Patient states she overall feels well. 6/22; patient presents for 3-week follow-up. She has tolerated the wraps well until her last wrap where she states they were uncomfortable. She attributes this to the home health nurse. She denies signs of infection. She has started her first treatment of antibody infusions for her Bilateral lower extremity squamous cell carcinoma. She has no complaints today. 7/21; patient presents for 1 month follow-up. Unfortunately she has not had good experience with her wrap changes with home health. She would like to do her own dressing changes. She continues to do her antibody infusions. She denies signs of infection. 7/28; patient presents for 1 week follow-up. At last clinic visit she was switched to daily dressing changes due to issues with the wrap and home health placing them. Unfortunately she has developed weeping to her legs bilaterally.  She would like to be placed in wraps today. She would also like to follow with [place] weekly for wrap changes instead of having home health change them. She denies signs of infection. 8/4; patient presents for 1 week follow-up. She has tolerated the Kerlix/Coban wraps well. She no longer has weeping to her legs. She took the wrap off 1 day before coming in to be able to take a shower. She has no issues or complaints today. She denies signs of infection. 8/18; patient presents for follow-up. Patient has tolerated the wraps well. She brought her Velcro compression wraps today. She has no issues or complaints today. She had her chemotherapy infusion yesterday without issues. She denies signs of infection. 8/25; patient presents for follow-up. She used her juxta lite compressions for the past week. It is unclear if she is able to put these on correctly since she states she has a hard time getting them to look right. She reports 2 open wounds. She currently denies signs of infection. 9/1; patient presents for 1 week follow-up. She has 1 open wound. She tolerated the compression wraps well. She currently denies signs of infection. 9/8; patient presents for 1 week follow-up. She has 2 open wounds 1 on each leg. She has tolerated the compression wrap well. She currently denies signs of infection. 9/15; patient presents for 1 week follow-up. She now has 3 wounds. 2 on the left and 1 on the right. She continues to tolerate the compression wrap well. She currently denies signs of infection. She has obtained furosemide by her primary care physician and would like to discuss when to take this. 9/22; patient presents for 1 week follow-up. She has scattered wounds on her lower extremities bilaterally. She did take furosemide twice in the past week. She does not recall having  to urinate more frequently. She tolerated the 3 layer compression well. She denies signs of infection. 10/13; patient has not been here recently  because of [place]. Apparently the facility was only putting gauze on her legs. This is a patient I do not normally see. 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 Ronnald Ramp at [facility] dermatology. She is quite convincing that she did not have radiation to her lower legs. It is likely she also has significant chronic venous insufficiency stasis dermatitis. We have been using silver alginate under kerlix Coban. She has obvious open areas medially on the right and areas on the left. She also has areas of extensive dry flaking adherent areas on the right and to a lesser extent on the left anterior. Nodular areas on the left lateral lower leg left posterior calf and right mid calf medially. 10/21; patient presents for follow-up. She has no issues or complaints today. She has tolerated the 3 layer compression well bilaterally. She denies signs of infection. 10/27; patient presents for follow-up. She continues to tolerate the 3 layer compression wrap well. She denies signs of infection. 11/30; patient presents for follow-up. She has no issues or complaints today. 11/10; patient arrived in clinic today for nurse visit accompanied by her daughter from [place]. The daughter had multiple questions so we turned this into doctors visit. Apparently after the last time I saw this woman in October she went to see Dr. Ronnald Ramp but he did not take the wraps off. She previously was treated with Cemiplimab for her squamous cell carcinoma. She did not receive radiation to her legs. I had wanted Dr. Ronnald Ramp to look at this because of the exceptionally damaged skin on her lower legs bilaterally. She has odd looking wounds on the left medial lower leg also extending posteriorly which we have not made a lot of progress on. But she also has a raised hyperkeratotic nodules on the right anterior tibial area plaques on the left medial lower thigh. These are  not areas that are under compression. We have been using silver alginate on any open areas Liberal TCA under 3 layer compression. 11/17; patient presents for follow-up. She had her wraps taken off yesterday for her dermatology appointment. She reports excessive weeping to her legs bilaterally after the wraps were taken off. She currently denies signs of infection. 12/12/2020 upon evaluation today patient appears to be doing about the same in regard to her wounds. She was actually started on Cipro after having biopsies apparently at her dermatology clinic she does not have the results back from the actual biopsy but the culture did return and apparently they placed her on the Cipro she started that just this morning. Other than that her legs appear to be doing okay at this time. Objective Constitutional Well-nourished and well-hydrated in no acute distress. Vitals Time Taken: 11:39 AM, Height: 65 in, Weight: 163 lbs, BMI: 27.1, Temperature: 97.4 F, Pulse: 74 bpm, Respiratory Rate: 17 breaths/min, Blood Pressure: 147/88 mmHg. Respiratory normal breathing without difficulty. Psychiatric this patient is able to make decisions and demonstrates good insight into disease process. Alert and Oriented x 3. pleasant and cooperative. General Notes: Upon inspection patient's wounds are actually showing signs of improvement overall I do not see any really major or large open/draining areas but her legs in general do appear to need some work as far as some of the dry skin which is now wet is  concerned. I did actually try to get some of this off with just using the thin cardboard measuring sticks. I was able to get some of it away hopefully this should help to some degree. Nonetheless in general I think it is good to take some time to try to get things under control here. Integumentary (Hair, Skin) Wound #15 status is Open. Original cause of wound was Gradually Appeared. The date acquired was: 11/08/2020. The  wound has been in treatment 4 weeks. The wound is located on the Right,Posterior Lower Leg. The wound measures 4.5cm length x 10.5cm width x 0.1cm depth; 37.11cm^2 area and 3.711cm^3 volume. There is Fat Layer (Subcutaneous Tissue) exposed. There is no tunneling or undermining noted. There is a medium amount of serosanguineous drainage noted. The wound margin is distinct with the outline attached to the wound base. There is large (67-100%) red, pink granulation within the wound bed. There is no necrotic tissue within the wound bed. Wound #7R status is Open. Original cause of wound was Blister. The date acquired was: 04/20/2020. The wound has been in treatment 26 weeks. The wound is located on the Left,Anterior,Circumferential Lower Leg. The wound measures 12.5cm length x 22cm width x 0.1cm depth; 215.984cm^2 area and 21.598cm^3 volume. There is Fat Layer (Subcutaneous Tissue) exposed. There is no tunneling or undermining noted. There is a medium amount of serosanguineous drainage noted. The wound margin is distinct with the outline attached to the wound base. There is large (67-100%) red granulation within the wound bed. There is no necrotic tissue within the wound bed. 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 There was a Three Layer Compression Therapy Procedure by Rhae Hammock, RN. Post procedure Diagnosis Wound #15: Same as Pre-Procedure Wound #7R Pre-procedure diagnosis of Wound #7R is a Malignant Wound located on the Left,Anterior,Circumferential Lower Leg . There was a Three Layer Compression Therapy Procedure by  Rhae Hammock, RN. Post procedure Diagnosis Wound #7R: Same as Pre-Procedure Plan Follow-up Appointments: Return Appointment in 1 week. - with Dr. Heber [place] Bathing/ Shower/ Hygiene: May shower with protection but do not get wound dressing(s) wet. - Use cast protector Edema Control - Lymphedema / SCD / Other: Elevate legs to the level of the heart or above for 30 minutes daily and/or when sitting, a frequency of: - elevate the legs throughout the day heart level if possible. Avoid standing for long periods of time. Exercise regularly Additional Orders / Instructions: Follow Nutritious Diet WOUND #15: - Lower Leg Wound Laterality: Right, Posterior Cleanser: Soap and Water 1 x Per Week/15 Days Discharge Instructions: May shower and wash wound with dial antibacterial soap and water prior to dressing change. Cleanser: Wound Cleanser 1 x Per Week/15 Days Discharge Instructions: Cleanse the wound with wound cleanser prior to applying a clean dressing using gauze sponges, not tissue or cotton balls. Peri-Wound Care: Triamcinolone 15 (g) 1 x Per Week/15 Days Discharge Instructions: In clinic only. Use triamcinolone 15 (g) in clinic only. Peri-Wound Care: Sween Lotion (Moisturizing lotion) 1 x Per Week/15 Days Discharge Instructions: Apply moisturizing lotion as directed Prim Dressing: KerraCel Ag Gelling Fiber Dressing, 4x5 in (silver alginate) 1 x Per Week/15 Days ary Discharge Instructions: Apply silver alginate  to wound bed as instructed Secondary Dressing: Woven Gauze Sponge, Non-Sterile 4x4 in 1 x Per Week/15 Days Discharge Instructions: Apply over primary dressing as directed. Secondary Dressing: ABD Pad, 8x10 1 x Per Week/15 Days Discharge Instructions: Apply over primary dressing as directed. Com pression Wrap: ThreePress (3 layer compression wrap) 1 x Per Week/15 Days Discharge Instructions: Apply three layer compression as directed. WOUND #7R: - Lower Leg Wound Laterality: Left,  Anterior, Circumferential Cleanser: Soap and Water 1 x Per Week/15 Days Discharge Instructions: May shower and wash wound with dial antibacterial soap and water prior to dressing change. Cleanser: Wound Cleanser 1 x Per Week/15 Days Discharge Instructions: Cleanse the wound with wound cleanser prior to applying a clean dressing using gauze sponges, not tissue or cotton balls. Peri-Wound Care: Triamcinolone 15 (g) 1 x Per Week/15 Days Discharge Instructions: In clinic only. Use triamcinolone 15 (g) in clinic only. Peri-Wound Care: Sween Lotion (Moisturizing lotion) 1 x Per Week/15 Days Discharge Instructions: Apply moisturizing lotion as directed Prim Dressing: KerraCel Ag Gelling Fiber Dressing, 4x5 in (silver alginate) 1 x Per Week/15 Days ary Discharge Instructions: Apply silver alginate to wound bed as instructed Secondary Dressing: Woven Gauze Sponge, Non-Sterile 4x4 in 1 x Per Week/15 Days Discharge Instructions: Apply over primary dressing as directed. Secondary Dressing: ABD Pad, 8x10 1 x Per Week/15 Days Discharge Instructions: Apply over primary dressing as directed. Com pression Wrap: ThreePress (3 layer compression wrap) 1 x Per Week/15 Days Discharge Instructions: Apply three layer compression as directed. 1. Would recommend currently that we go ahead and continue with the wound care measures as before and the patient is in agreement with the plan this includes the use of the triamcinolone to the legs along with lotion to try to help soften up some of the dry skin so far this seems to have been beneficial. 2. We will use silver alginate to any open wound locations. Following this we will apply an ABD pad and 3 layer compression wrap. 3. I would recommend the patient take the Cipro as directed by her dermatologist I think that still can be appropriate going forward. We will see patient back for reevaluation in 1 week here in the clinic. If anything worsens or changes patient will  contact our office for additional recommendations. Electronic Signature(s) Signed: 12/12/2020 12:50:51 PM By: Worthy Keeler PA-C Entered By: Worthy Keeler on 12/12/2020 12:50:51 -------------------------------------------------------------------------------- SuperBill Details Patient Name: Date of Service: [name], Addison. 12/12/2020 Medical Record Number: [id number] Patient Account Number: [id number] Date of Birth/Sex: Treating RN: [birth year] (87 y.o. [name], [name] Primary Care Provider: [name] Other Clinician: Referring Provider: Treating Provider/Extender: Charlestine Massed in Treatment: 26 Diagnosis Coding ICD-10 Codes Code Description C44.92 Squamous cell carcinoma of skin, unspecified L97.819 Non-pressure chronic ulcer of other part of right lower leg with unspecified severity L97.829 Non-pressure chronic ulcer of other part of left lower leg with unspecified severity I87.2 Venous insufficiency (chronic) (peripheral) Facility Procedures CPT4: Code [id number] Description: 81 BILATERAL: Application of multi-layer venous compression system; leg (below knee), including ankle and t. Modifier: Quantity: 1 Physician Procedures : CPT4 Code Description Modifier [id number] - WC PHYS LEVEL 4 - EST PT ICD-10 Diagnosis Description C44.92 Squamous cell carcinoma of skin, unspecified L97.819 Non-pressure chronic ulcer of other part of right lower leg with unspecified severity  L97.829 Non-pressure chronic ulcer of other part of left lower leg with unspecified severity I87.2 Venous insufficiency (chronic) (peripheral) Quantity: 1  Electronic Signature(s) Signed: 12/12/2020 12:51:05 PM By: Worthy Keeler PA-C Entered By: Worthy Keeler on 12/12/2020 12:51:05

## 2020-12-19 ENCOUNTER — Ambulatory Visit: Payer: Medicare Other

## 2020-12-19 ENCOUNTER — Other Ambulatory Visit: Payer: Medicare Other

## 2020-12-20 ENCOUNTER — Encounter (HOSPITAL_BASED_OUTPATIENT_CLINIC_OR_DEPARTMENT_OTHER): Payer: Medicare Other | Admitting: Internal Medicine

## 2020-12-20 ENCOUNTER — Encounter (HOSPITAL_BASED_OUTPATIENT_CLINIC_OR_DEPARTMENT_OTHER): Payer: Medicare Other | Attending: Internal Medicine | Admitting: Internal Medicine

## 2020-12-20 ENCOUNTER — Other Ambulatory Visit: Payer: Self-pay

## 2020-12-20 DIAGNOSIS — I872 Venous insufficiency (chronic) (peripheral): Secondary | ICD-10-CM | POA: Diagnosis not present

## 2020-12-20 DIAGNOSIS — C4492 Squamous cell carcinoma of skin, unspecified: Secondary | ICD-10-CM | POA: Diagnosis not present

## 2020-12-20 DIAGNOSIS — L97829 Non-pressure chronic ulcer of other part of left lower leg with unspecified severity: Secondary | ICD-10-CM | POA: Diagnosis not present

## 2020-12-20 DIAGNOSIS — I509 Heart failure, unspecified: Secondary | ICD-10-CM | POA: Diagnosis not present

## 2020-12-20 DIAGNOSIS — L97812 Non-pressure chronic ulcer of other part of right lower leg with fat layer exposed: Secondary | ICD-10-CM | POA: Insufficient documentation

## 2020-12-20 DIAGNOSIS — L97819 Non-pressure chronic ulcer of other part of right lower leg with unspecified severity: Secondary | ICD-10-CM | POA: Diagnosis not present

## 2020-12-20 DIAGNOSIS — L97822 Non-pressure chronic ulcer of other part of left lower leg with fat layer exposed: Secondary | ICD-10-CM | POA: Insufficient documentation

## 2020-12-20 DIAGNOSIS — I110 Hypertensive heart disease with heart failure: Secondary | ICD-10-CM | POA: Diagnosis not present

## 2020-12-20 NOTE — Progress Notes (Signed)
[name] ([id number]) , Visit Report for 12/20/2020 Chief Complaint Document Details Patient Name: Date of Service: [name], [place] 12/20/2020 1:15 PM Medical Record Number: [id number] Patient Account Number: [id number] Date of Birth/Sex: Treating RN: [birth year] (87 y.o. F) Primary Care Provider: [name] Other Clinician: Referring Provider: Treating Provider/Extender: [name] in Treatment: 44 Information Obtained from: Patient Chief Complaint Bilateral lower extremity wounds that have been biopsied and positive for squamous cell carcinoma Electronic Signature(s) Signed: 12/20/2020 1:54:20 PM By: Kalman Shan DO Entered By: Kalman Shan on 12/20/2020 13:49:54 -------------------------------------------------------------------------------- HPI Details Patient Name: Date of Service: [name], [name] 12/20/2020 1:15 PM Medical Record Number: [id number] Patient Account Number: [id number] Date of Birth/Sex: Treating RN: 22-Nov-1925 (87 y.o. 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 She states that the wound on the right has been present for a year. The 1 on the left has opened up 1 month ago. She is followed with oncology for this issue as she had biopsies that showed squamous cell carcinoma. She is also seeing radiation oncology for treatment options. She presents today because she would like for her wounds to be healed by [place]. She currently denies  signs of infection. 6/1; patient presents for 1 week follow-up. She states she has tolerated the leg wraps well. She states these do not bother her and is happy to continue with them. She is scheduled to see her oncologist today to go over treatment options for the bilateral lower extremity squamous cell carcinoma. Radiation is currently not a recommended option. Patient states she overall feels well. 6/22; patient presents for 3-week follow-up. She has tolerated the wraps well until her last wrap where she states they were uncomfortable. She attributes this to the home health nurse. She denies signs of infection. She has started her first treatment of antibody infusions for her Bilateral lower extremity squamous cell carcinoma. She has no complaints today. 7/21; patient presents for 1 month follow-up. Unfortunately she has not had good experience with her wrap changes with home health. She would like to do her own dressing changes. She continues to do her antibody infusions. She denies signs of infection. 7/28; patient presents for 1 week follow-up. At last clinic visit she was switched to daily dressing changes due to issues with the wrap and home health placing them. Unfortunately she has developed weeping to her legs bilaterally. She would like to be placed in wraps today. She would also like to follow with [place] weekly for wrap changes instead of having home health change them. She denies signs of infection. 8/4; patient presents for 1 week follow-up. She has tolerated the Kerlix/Coban wraps well. She no longer has weeping to her legs. She took the wrap off 1 day before coming in to be able to take a shower. She has no issues or complaints today. She denies signs of infection. 8/18; patient presents for follow-up. Patient has tolerated the wraps well. She brought her Velcro compression wraps today. She has no issues or complaints today. She had her chemotherapy infusion yesterday without issues. She  denies signs of infection. 8/25; patient presents for follow-up. She used her juxta lite compressions for the past week. It is unclear if she is able to put these on correctly since she states she has a hard time getting them to look right. She reports 2 open wounds. She currently denies signs of infection. 9/1; patient presents for 1 week follow-up. She has 1 open wound. She tolerated the compression wraps well. She currently denies signs of infection. 9/8; patient presents for 1 week follow-up. She has 2 open wounds 1 on each leg. She has tolerated the compression wrap well. She currently denies signs of infection. 9/15; patient presents for 1 week follow-up. She now has 3 wounds. 2 on the left and 1 on the right. She continues to tolerate the compression wrap well. She currently denies signs of infection. She has obtained furosemide by her primary care physician and would like to discuss when to take this. 9/22; patient presents for 1 week follow-up. She has scattered wounds on her lower extremities bilaterally. She did take furosemide twice in the past week. She does not recall having to urinate more frequently. She tolerated the 3 layer compression well. She denies signs of infection. 10/13; patient has not been here recently because of [place]. Apparently the facility was only putting gauze on her legs. This is a patient I do not normally see. 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 Ronnald Ramp at [facility] dermatology. She is quite convincing that she did not have radiation to her lower legs. It is likely she also has significant chronic venous insufficiency stasis dermatitis. We have been using silver alginate under kerlix Coban. She has obvious open areas medially on the right and areas on the left. She also has areas of extensive dry flaking adherent areas on the right and to a lesser extent on the left anterior.  Nodular areas on the left lateral lower leg left posterior calf and right mid calf medially. 10/21; patient presents for follow-up. She has no issues or complaints today. She has tolerated the 3 layer compression well bilaterally. She denies signs of infection. 10/27; patient presents for follow-up. She continues to tolerate the 3 layer compression wrap well. She denies signs of infection. 11/30; patient presents for follow-up. She has no issues or complaints today. 11/10; patient arrived in clinic today for nurse visit accompanied by her daughter from [place]. The daughter had multiple questions so we turned this into doctors visit. Apparently after the last time I saw this woman in October she went to see Dr. Ronnald Ramp but he did not take the wraps off. She previously was treated with Cemiplimab for her squamous cell carcinoma. She did not receive radiation to her legs. I had wanted Dr. Ronnald Ramp to look at this because of the exceptionally damaged skin on her lower legs bilaterally. She has odd looking wounds on the left medial lower leg also extending posteriorly which we have not made a lot of progress on. But she also has a raised hyperkeratotic nodules on the right anterior tibial area plaques on the left medial lower thigh. These are not areas that are under compression. We have been using silver alginate on any open areas Liberal TCA under 3 layer compression. 11/17; patient presents for follow-up. She had her wraps taken off yesterday for her dermatology appointment. She reports excessive weeping to her legs bilaterally after the wraps were taken off. She currently  denies signs of infection. 12/12/2020 upon evaluation today patient appears to be doing about the same in regard to her wounds. She was actually started on Cipro after having biopsies apparently at her dermatology clinic she does not have the results back from the actual biopsy but the culture did return and apparently they placed her on  the Cipro she started that just this morning. Other than that her legs appear to be doing okay at this time. 12/1; patient presents for follow-up. She has no issues or complaints today. She has started Lasix 40 mg daily to help with her bilateral leg swelling. Electronic Signature(s) Signed: 12/20/2020 1:54:20 PM By: Kalman Shan DO Entered By: Kalman Shan on 12/20/2020 13:50:35 -------------------------------------------------------------------------------- Physical Exam Details Patient Name: Date of Service: [name], [place] 12/20/2020 1:15 PM Medical Record Number: [id number] Patient Account Number: [id number] Date of Birth/Sex: Treating RN: 1926/01/07 (87 y.o. F) Primary Care Provider: [name] Other Clinician: Referring Provider: Treating Provider/Extender: [name] in Treatment: 76 Constitutional respirations regular, non-labored and within target range for patient.. Cardiovascular 2+ dorsalis pedis/posterior tibialis pulses. Psychiatric pleasant and cooperative. Notes Right lower extremity: Small open wound to the anterior aspect limited to skin breakdown. Left lower extremity: Open wound to the posterior aspect with granulation tissue present. Good edema control Electronic Signature(s) Signed: 12/20/2020 1:54:20 PM By: Kalman Shan DO Entered By: Kalman Shan on 12/20/2020 13:51:34 -------------------------------------------------------------------------------- Physician Orders Details Patient Name: Date of Service: [name], [place] 12/20/2020 1:15 PM Medical Record Number: [id number] Patient Account Number: [id number] Date of Birth/Sex: Treating RN: 10-20-1925 (87 y.o. [name] Primary Care Provider: [name] Other Clinician: Referring Provider: Treating Provider/Extender: [name] in Treatment: 34 Verbal / Phone Orders: No Diagnosis Coding ICD-10  Coding Code Description C44.92 Squamous cell carcinoma of skin, unspecified L97.819 Non-pressure chronic ulcer of other part of right lower leg with unspecified severity L97.829 Non-pressure chronic ulcer of other part of left lower leg with unspecified severity I87.2 Venous insufficiency (chronic) (peripheral) Follow-up Appointments ppointment in 1 week. - with Dr. Heber [place] Return A Bathing/ Shower/ Hygiene May shower with protection but do not get wound dressing(s) wet. - Use cast protector Edema Control - Lymphedema / SCD / Other Elevate legs to the level of the heart or above for 30 minutes daily and/or when sitting, a frequency of: - elevate the legs throughout the day heart level if possible. Avoid standing for long periods of time. Exercise regularly Additional Orders / Instructions Follow Nutritious Diet Wound Treatment Wound #15 - Lower Leg Wound Laterality: Right, Posterior Cleanser: Soap and Water 1 x Per Week/15 Days Discharge Instructions: May shower and wash wound with dial antibacterial soap and water prior to dressing change. Cleanser: Wound Cleanser 1 x Per Week/15 Days Discharge Instructions: Cleanse the wound with wound cleanser prior to applying a clean dressing using gauze sponges, not tissue or cotton balls. Peri-Wound Care: Triamcinolone 15 (g) 1 x Per Week/15 Days Discharge Instructions: In clinic only. Use triamcinolone 15 (g) in clinic only. Peri-Wound Care: Sween Lotion (Moisturizing lotion) 1 x Per Week/15 Days Discharge Instructions: Apply moisturizing lotion as directed Prim Dressing: KerraCel Ag Gelling Fiber Dressing, 4x5 in (silver alginate) 1 x Per Week/15 Days ary Discharge Instructions: Apply silver alginate to wound bed as instructed Secondary Dressing: Woven Gauze Sponge, Non-Sterile 4x4 in 1 x Per Week/15 Days Discharge Instructions: Apply over primary dressing as directed. Secondary Dressing: ABD Pad, 8x10 1 x Per  Week/15 Days Discharge  Instructions: Apply over primary dressing as directed. Compression Wrap: ThreePress (3 layer compression wrap) 1 x Per Week/15 Days Discharge Instructions: Apply three layer compression as directed. Wound #7R - Lower Leg Wound Laterality: Left, Circumferential Cleanser: Soap and Water 1 x Per Week/15 Days Discharge Instructions: May shower and wash wound with dial antibacterial soap and water prior to dressing change. Cleanser: Wound Cleanser 1 x Per Week/15 Days Discharge Instructions: Cleanse the wound with wound cleanser prior to applying a clean dressing using gauze sponges, not tissue or cotton balls. Peri-Wound Care: Triamcinolone 15 (g) 1 x Per Week/15 Days Discharge Instructions: In clinic only. Use triamcinolone 15 (g) in clinic only. Peri-Wound Care: Sween Lotion (Moisturizing lotion) 1 x Per Week/15 Days Discharge Instructions: Apply moisturizing lotion as directed Prim Dressing: KerraCel Ag Gelling Fiber Dressing, 4x5 in (silver alginate) 1 x Per Week/15 Days ary Discharge Instructions: Apply silver alginate to wound bed as instructed Secondary Dressing: Woven Gauze Sponge, Non-Sterile 4x4 in 1 x Per Week/15 Days Discharge Instructions: Apply over primary dressing as directed. Secondary Dressing: ABD Pad, 8x10 1 x Per Week/15 Days Discharge Instructions: Apply over primary dressing as directed. Compression Wrap: ThreePress (3 layer compression wrap) 1 x Per Week/15 Days Discharge Instructions: Apply three layer compression as directed. Electronic Signature(s) Signed: 12/20/2020 1:54:20 PM By: Kalman Shan DO Entered By: Kalman Shan on 12/20/2020 13:51:58 -------------------------------------------------------------------------------- Problem List Details Patient Name: Date of Service: [name], [place] 12/20/2020 1:15 PM Medical Record Number: [id number] Patient Account Number: [id number] Date of Birth/Sex: Treating RN: 08/28/1925 (87 y.o. [name] Primary  Care Provider: [name] Other Clinician: Referring Provider: Treating Provider/Extender: [name] in Treatment: 27 Active Problems ICD-10 Encounter Code Description Active Date MDM Diagnosis C44.92 Squamous cell carcinoma of skin, unspecified 06/11/2020 No Yes L97.819 Non-pressure chronic ulcer of other part of right lower leg with unspecified 06/11/2020 No Yes severity L97.829 Non-pressure chronic ulcer of other part of left lower leg with unspecified 06/11/2020 No Yes severity I87.2 Venous insufficiency (chronic) (peripheral) 06/11/2020 No Yes Inactive Problems Resolved Problems Electronic Signature(s) Signed: 12/20/2020 1:54:20 PM By: Kalman Shan DO Entered By: Kalman Shan on 12/20/2020 13:49:27 -------------------------------------------------------------------------------- Progress Note Details Patient Name: Date of Service: [name], [place] A. 12/20/2020 1:15 PM Medical Record Number: [id number] Patient Account Number: [id number] Date of Birth/Sex: Treating RN: 1925-04-29 (87 y.o. F) Primary Care Provider: [name] Other Clinician: Referring Provider: Treating Provider/Extender: [name] in Treatment: 25 Subjective Chief Complaint Information obtained from Patient Bilateral lower extremity wounds that have been biopsied and positive for squamous cell carcinoma History of Present Illness (HPI) The following HPI elements were documented for the patient's wound: Location: left leg Admission 5/23 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 She states that the wound on the right has been present for a year. The 1 on the left has opened up 1 month  ago. She is followed with oncology for this issue as she had biopsies that showed squamous cell carcinoma. She is also seeing radiation oncology for treatment options. She presents today because she would like for her wounds to be healed by [place]. She currently denies signs of infection. 6/1; patient presents for 1 week follow-up. She states she  has tolerated the leg wraps well. She states these do not bother her and is happy to continue with them. She is scheduled to see her oncologist today to go over treatment options for the bilateral lower extremity squamous cell carcinoma. Radiation is currently not a recommended option. Patient states she overall feels well. 6/22; patient presents for 3-week follow-up. She has tolerated the wraps well until her last wrap where she states they were uncomfortable. She attributes this to the home health nurse. She denies signs of infection. She has started her first treatment of antibody infusions for her Bilateral lower extremity squamous cell carcinoma. She has no complaints today. 7/21; patient presents for 1 month follow-up. Unfortunately she has not had good experience with her wrap changes with home health. She would like to do her own dressing changes. She continues to do her antibody infusions. She denies signs of infection. 7/28; patient presents for 1 week follow-up. At last clinic visit she was switched to daily dressing changes due to issues with the wrap and home health placing them. Unfortunately she has developed weeping to her legs bilaterally. She would like to be placed in wraps today. She would also like to follow with [place] weekly for wrap changes instead of having home health change them. She denies signs of infection. 8/4; patient presents for 1 week follow-up. She has tolerated the Kerlix/Coban wraps well. She no longer has weeping to her legs. She took the wrap off 1 day before coming in to be able to take a shower. She has no issues or  complaints today. She denies signs of infection. 8/18; patient presents for follow-up. Patient has tolerated the wraps well. She brought her Velcro compression wraps today. She has no issues or complaints today. She had her chemotherapy infusion yesterday without issues. She denies signs of infection. 8/25; patient presents for follow-up. She used her juxta lite compressions for the past week. It is unclear if she is able to put these on correctly since she states she has a hard time getting them to look right. She reports 2 open wounds. She currently denies signs of infection. 9/1; patient presents for 1 week follow-up. She has 1 open wound. She tolerated the compression wraps well. She currently denies signs of infection. 9/8; patient presents for 1 week follow-up. She has 2 open wounds 1 on each leg. She has tolerated the compression wrap well. She currently denies signs of infection. 9/15; patient presents for 1 week follow-up. She now has 3 wounds. 2 on the left and 1 on the right. She continues to tolerate the compression wrap well. She currently denies signs of infection. She has obtained furosemide by her primary care physician and would like to discuss when to take this. 9/22; patient presents for 1 week follow-up. She has scattered wounds on her lower extremities bilaterally. She did take furosemide twice in the past week. She does not recall having to urinate more frequently. She tolerated the 3 layer compression well. She denies signs of infection. 10/13; patient has not been here recently because of [place]. Apparently the facility was only putting gauze on her legs. This is a patient I do not normally see. 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 Ronnald Ramp at [facility] dermatology. She is quite convincing that she did not have radiation to her lower legs. It is likely she also has significant chronic venous  insufficiency stasis dermatitis. We  have been using silver alginate under kerlix Coban. She has obvious open areas medially on the right and areas on the left. She also has areas of extensive dry flaking adherent areas on the right and to a lesser extent on the left anterior. Nodular areas on the left lateral lower leg left posterior calf and right mid calf medially. 10/21; patient presents for follow-up. She has no issues or complaints today. She has tolerated the 3 layer compression well bilaterally. She denies signs of infection. 10/27; patient presents for follow-up. She continues to tolerate the 3 layer compression wrap well. She denies signs of infection. 11/30; patient presents for follow-up. She has no issues or complaints today. 11/10; patient arrived in clinic today for nurse visit accompanied by her daughter from [place]. The daughter had multiple questions so we turned this into doctors visit. Apparently after the last time I saw this woman in October she went to see Dr. Ronnald Ramp but he did not take the wraps off. She previously was treated with Cemiplimab for her squamous cell carcinoma. She did not receive radiation to her legs. I had wanted Dr. Ronnald Ramp to look at this because of the exceptionally damaged skin on her lower legs bilaterally. She has odd looking wounds on the left medial lower leg also extending posteriorly which we have not made a lot of progress on. But she also has a raised hyperkeratotic nodules on the right anterior tibial area plaques on the left medial lower thigh. These are not areas that are under compression. We have been using silver alginate on any open areas Liberal TCA under 3 layer compression. 11/17; patient presents for follow-up. She had her wraps taken off yesterday for her dermatology appointment. She reports excessive weeping to her legs bilaterally after the wraps were taken off. She currently denies signs of infection. 12/12/2020 upon evaluation today  patient appears to be doing about the same in regard to her wounds. She was actually started on Cipro after having biopsies apparently at her dermatology clinic she does not have the results back from the actual biopsy but the culture did return and apparently they placed her on the Cipro she started that just this morning. Other than that her legs appear to be doing okay at this time. 12/1; patient presents for follow-up. She has no issues or complaints today. She has started Lasix 40 mg daily to help with her bilateral leg swelling. Patient History Information obtained from Patient. Family History Unknown History. Social History Never smoker, Marital Status - Single, Alcohol Use - Never, Drug Use - No History, Caffeine Use - Never. Medical History Eyes Denies history of Cataracts, Optic Neuritis Ear/Nose/Mouth/Throat Denies history of Chronic sinus problems/congestion, Middle ear problems Hematologic/Lymphatic Patient has history of Anemia Denies history of Hemophilia, Human Immunodeficiency Virus, Lymphedema, Sickle Cell Disease Respiratory Denies history of Aspiration, Asthma, Chronic Obstructive Pulmonary Disease (COPD), Pneumothorax, Sleep Apnea, Tuberculosis Cardiovascular Patient has history of Arrhythmia - Atrial Flutter, A fibb, Congestive Heart Failure, Hypertension Denies history of Angina, Coronary Artery Disease, Deep Vein Thrombosis, Hypotension, Myocardial Infarction, Peripheral Arterial Disease, Peripheral Venous Disease, Phlebitis, Vasculitis Gastrointestinal Patient has history of Colitis Denies history of Cirrhosis , Crohnoos, Hepatitis A, Hepatitis B, Hepatitis C Endocrine Denies history of Type I Diabetes, Type II Diabetes Genitourinary Denies history of End Stage Renal Disease Immunological Denies history of Lupus Erythematosus, Raynaudoos, Scleroderma Integumentary (Skin) Denies history of History of Burn Musculoskeletal Patient has history of  Osteoarthritis Denies history of Gout, Rheumatoid Arthritis, Osteomyelitis Neurologic  Denies history of Dementia, Neuropathy, Quadriplegia, Paraplegia, Seizure Disorder Oncologic Denies history of Received Chemotherapy, Received Radiation Hospitalization/Surgery History - removal of rod left leg. 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 Vitals Time Taken: 1:00 PM, Height: 65 in, Weight: 163 lbs, BMI: 27.1, Temperature: 97.6 F, Pulse: 58 bpm, Respiratory Rate: 16 breaths/min, Blood Pressure: 130/70 mmHg. Cardiovascular 2+ dorsalis pedis/posterior tibialis pulses. Psychiatric pleasant and cooperative. General Notes: Right lower extremity: Small open wound to the anterior aspect limited to skin breakdown. Left lower extremity: Open wound to the posterior aspect with granulation tissue present. Good edema control Integumentary (Hair, Skin) Wound #15 status is Open. Original cause of wound was Gradually Appeared. The date acquired was: 11/08/2020. The wound has been in treatment 6 weeks. The wound is located on the Right,Posterior Lower Leg. The wound measures 0.2cm length x 0.2cm width x 0.1cm depth; 0.031cm^2 area and 0.003cm^3 volume. There is Fat Layer (Subcutaneous Tissue) exposed. There is no tunneling or undermining noted. There is a medium amount of serosanguineous drainage noted. The wound margin is distinct with the outline attached to the wound base. There is large (67-100%) red, pink granulation within the wound bed. There is no necrotic tissue within the wound bed. Wound #7R status is Open. Original cause of wound was Blister. The date acquired was: 04/20/2020. The wound has been in treatment 27 weeks. The wound is located on the Left,Circumferential Lower  Leg. The wound measures 9cm length x 11cm width x 0.1cm depth; 77.754cm^2 area and 7.775cm^3 volume. There is Fat Layer (Subcutaneous Tissue) exposed. There is no tunneling or undermining noted. There is a medium amount of serosanguineous drainage noted. The wound margin is distinct with the outline attached to the wound base. There is large (67-100%) red granulation within the wound bed. There is no necrotic tissue within the wound bed. 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 Left lower extremity wounds have improved in appearance since last clinic visit. There is much better edema control with the use of Lasix. I recommended continuing silver alginate under 3 layer compression. No signs of infection on exam. Follow-up in 1 week Procedures Wound #15 Pre-procedure diagnosis of Wound #15 is a Venous Leg Ulcer located on the Right,Posterior Lower Leg . There was a Three Layer Compression Therapy Procedure by Levan Hurst, RN. Post procedure Diagnosis Wound #15: Same as Pre-Procedure Wound #7R Pre-procedure diagnosis of Wound #7R is a Malignant Wound located on the Left,Circumferential Lower Leg . There was a Three Layer Compression Therapy Procedure by Levan Hurst, RN. Post procedure Diagnosis Wound #7R: Same as Pre-Procedure Plan Follow-up Appointments: Return Appointment in 1 week. - with Dr. Heber Rampart Bathing/ Shower/ Hygiene: May shower with protection but do not get wound dressing(s) wet. - Use cast protector Edema Control - Lymphedema / SCD / Other: Elevate legs to the level of the heart or above for 30 minutes daily and/or when sitting, a frequency of: - elevate the  legs throughout the day heart level if possible. Avoid standing for long periods of time. Exercise regularly Additional Orders / Instructions: Follow Nutritious Diet WOUND #15: - Lower Leg Wound Laterality:  Right, Posterior Cleanser: Soap and Water 1 x Per Week/15 Days Discharge Instructions: May shower and wash wound with dial antibacterial soap and water prior to dressing change. Cleanser: Wound Cleanser 1 x Per Week/15 Days Discharge Instructions: Cleanse the wound with wound cleanser prior to applying a clean dressing using gauze sponges, not tissue or cotton balls. Peri-Wound Care: Triamcinolone 15 (g) 1 x Per Week/15 Days Discharge Instructions: In clinic only. Use triamcinolone 15 (g) in clinic only. Peri-Wound Care: Sween Lotion (Moisturizing lotion) 1 x Per Week/15 Days Discharge Instructions: Apply moisturizing lotion as directed Prim Dressing: KerraCel Ag Gelling Fiber Dressing, 4x5 in (silver alginate) 1 x Per Week/15 Days ary Discharge Instructions: Apply silver alginate to wound bed as instructed Secondary Dressing: Woven Gauze Sponge, Non-Sterile 4x4 in 1 x Per Week/15 Days Discharge Instructions: Apply over primary dressing as directed. Secondary Dressing: ABD Pad, 8x10 1 x Per Week/15 Days Discharge Instructions: Apply over primary dressing as directed. Com pression Wrap: ThreePress (3 layer compression wrap) 1 x Per Week/15 Days Discharge Instructions: Apply three layer compression as directed. WOUND #7R: - Lower Leg Wound Laterality: Left, Circumferential Cleanser: Soap and Water 1 x Per Week/15 Days Discharge Instructions: May shower and wash wound with dial antibacterial soap and water prior to dressing change. Cleanser: Wound Cleanser 1 x Per Week/15 Days Discharge Instructions: Cleanse the wound with wound cleanser prior to applying a clean dressing using gauze sponges, not tissue or cotton balls. Peri-Wound Care: Triamcinolone 15 (g) 1 x Per Week/15  Days Discharge Instructions: In clinic only. Use triamcinolone 15 (g) in clinic only. Peri-Wound Care: Sween Lotion (Moisturizing lotion) 1 x Per Week/15 Days Discharge Instructions: Apply moisturizing lotion as directed Prim Dressing: KerraCel Ag Gelling Fiber Dressing, 4x5 in (silver alginate) 1 x Per Week/15 Days ary Discharge Instructions: Apply silver alginate to wound bed as instructed Secondary Dressing: Woven Gauze Sponge, Non-Sterile 4x4 in 1 x Per Week/15 Days Discharge Instructions: Apply over primary dressing as directed. Secondary Dressing: ABD Pad, 8x10 1 x Per Week/15 Days Discharge Instructions: Apply over primary dressing as directed. Com pression Wrap: ThreePress (3 layer compression wrap) 1 x Per Week/15 Days Discharge Instructions: Apply three layer compression as directed. 1. Silver alginate under 3 layer compression 2. Follow-up in 1 week Electronic Signature(s) Signed: 12/20/2020 1:54:20 PM By: Kalman Shan DO Entered By: Kalman Shan on 12/20/2020 13:53:36 -------------------------------------------------------------------------------- HxROS Details Patient Name: Date of Service: [name], [place]. 12/20/2020 1:15 PM Medical Record Number: [id number] Patient Account Number: [id number] Date of Birth/Sex: Treating RN: 1925/09/09 (87 y.o. F) Primary Care Provider: [name] Other Clinician: Referring Provider: Treating Provider/Extender: [name] in Treatment: 21 Information Obtained From Patient Eyes Medical History: Negative for: Cataracts; Optic Neuritis Ear/Nose/Mouth/Throat Medical History: Negative for: Chronic sinus problems/congestion; Middle ear problems Hematologic/Lymphatic Medical History: Positive for: Anemia Negative for: Hemophilia; Human Immunodeficiency Virus; Lymphedema; Sickle Cell Disease Respiratory Medical History: Negative for: Aspiration; Asthma; Chronic Obstructive Pulmonary  Disease (COPD); Pneumothorax; Sleep Apnea; Tuberculosis Cardiovascular Medical History: Positive for: Arrhythmia - Atrial Flutter, A fibb; Congestive Heart Failure; Hypertension Negative for: Angina; Coronary Artery Disease; Deep Vein Thrombosis; Hypotension; Myocardial Infarction; Peripheral Arterial Disease; Peripheral Venous Disease; Phlebitis; Vasculitis Past Medical History Notes: hyperlipidemia Gastrointestinal Medical History: Positive for: Colitis Negative for: Cirrhosis ; Crohns; Hepatitis A; Hepatitis B; Hepatitis C Endocrine Medical History: Negative for: Type I Diabetes; Type II Diabetes Past Medical History Notes: hypothyroidism Genitourinary Medical History: Negative for: End Stage Renal Disease Immunological Medical  History: Negative for: Lupus Erythematosus; Raynauds; Scleroderma Integumentary (Skin) Medical History: Negative for: History of Burn Musculoskeletal Medical History: Positive for: Osteoarthritis Negative for: Gout; Rheumatoid Arthritis; Osteomyelitis Neurologic Medical History: Negative for: Dementia; Neuropathy; Quadriplegia; Paraplegia; Seizure Disorder Past Medical History Notes: lumbar spindylolysis Oncologic Medical History: Negative for: Received Chemotherapy; Received Radiation Past Medical History Notes: BLE squamous ceel carcionoma Immunizations Pneumococcal Vaccine: Received Pneumococcal Vaccination: Yes Received Pneumococcal Vaccination On or After 60th Birthday: No Implantable Devices None Hospitalization / Surgery History Type of Hospitalization/Surgery removal of rod left leg Family and Social History Unknown History: Yes; Never smoker; Marital Status - Single; Alcohol Use: Never; Drug Use: No History; Caffeine Use: Never; Financial Concerns: No; Food, Clothing or Shelter Needs: No; Support System Lacking: No; Transportation Concerns: No Electronic Signature(s) Signed: 12/20/2020 1:54:20 PM By: Kalman Shan DO Entered  By: Kalman Shan on 12/20/2020 13:50:44 -------------------------------------------------------------------------------- SuperBill Details Patient Name: Date of Service: [name], [name] 12/20/2020 Medical Record Number: [id number] Patient Account Number: [id number] Date of Birth/Sex: Treating RN: [birth year] (87 y.o. [name] Primary Care Provider: [name] Other Clinician: Referring Provider: Treating Provider/Extender: [name] in Treatment: 27 Diagnosis Coding ICD-10 Codes Code Description C44.92 Squamous cell carcinoma of skin, unspecified L97.819 Non-pressure chronic ulcer of other part of right lower leg with unspecified severity L97.829 Non-pressure chronic ulcer of other part of left lower leg with unspecified severity I87.2 Venous insufficiency (chronic) (peripheral) Facility Procedures CPT4: Code [id number] Description: 81 BILATERAL: Application of multi-layer venous compression system; leg (below knee), including ankle and t. Modifier: Quantity: 1 Physician Procedures : CPT4 Code Description Modifier [id number] - WC PHYS LEVEL 3 - EST PT ICD-10 Diagnosis Description C44.92 Squamous cell carcinoma of skin, unspecified L97.819 Non-pressure chronic ulcer of other part of right lower leg with unspecified severity  L97.829 Non-pressure chronic ulcer of other part of left lower leg with unspecified severity I87.2 Venous insufficiency (chronic) (peripheral) Quantity: 1 Electronic Signature(s) Signed: 12/20/2020 1:54:20 PM By: Kalman Shan DO Entered By: Kalman Shan on 12/20/2020 13:54:00

## 2020-12-24 NOTE — Progress Notes (Signed)
JASELYNN TAMAS ([id number]) , Visit Report for 12/20/2020 Arrival Information Details Patient Name: Date of Service: [name], [place] 12/20/2020 1:15 PM Medical Record Number: [id number] Patient Account Number: [id number] Date of Birth/Sex: Treating RN: Jul 28, 1925 (87 y.o. [name] Primary Care Jaxan Michel: [name] Other Clinician: Referring Mort Smelser: Treating Micholas Drumwright/Extender: [name] in Treatment: 44 Visit Information History Since Last Visit Added or deleted any medications: No Patient Arrived: Walker Any new allergies or adverse reactions: No Arrival Time: 13:00 Had a fall or experienced change in No Accompanied By: alone activities of daily living that may affect Transfer Assistance: None risk of falls: Patient Identification Verified: Yes Signs or symptoms of abuse/neglect since last visito No Secondary Verification Process Completed: Yes Hospitalized since last visit: No Patient Requires Transmission-Based No Implantable device outside of the clinic excluding No Precautions: cellular tissue based products placed in the center Patient Has Alerts: Yes since last visit: Patient Alerts: R ABI = Non compressible Has Dressing in Place as Prescribed: Yes L ABI = Non compressible Has Compression in Place as Prescribed: Yes Pain Present Now: No Electronic Signature(s) Signed: 12/24/2020 3:59:15 PM By: Levan Hurst RN, BSN Entered By: Levan Hurst on 12/20/2020 13:00:51 -------------------------------------------------------------------------------- Compression Therapy Details Patient Name: Date of Service: [name], [place] A. 12/20/2020 1:15 PM Medical Record Number: [id number] Patient Account Number: [id number] Date of Birth/Sex: Treating RN: December 22, 1925 (87 y.o. [name] Primary Care Genita Nilsson: [name] Other Clinician: Referring Dayana Dalporto: Treating Katena Petitjean/Extender: [name] in Treatment: 27 Compression Therapy Performed for Wound Assessment: Wound #15 Right,Posterior Lower Leg Performed By: Clinician Levan Hurst, RN Compression Type: Three Layer Post Procedure Diagnosis Same as Pre-procedure Electronic Signature(s) Signed: 12/24/2020 3:59:15 PM By: Levan Hurst RN, BSN Entered By: Levan Hurst on 12/20/2020 13:35:55 -------------------------------------------------------------------------------- Compression Therapy Details Patient Name: Date of Service: [name], [place] 12/20/2020 1:15 PM Medical Record Number: [id number] Patient Account Number: [id number] Date of Birth/Sex: Treating RN: [birth year] (87 y.o. [name] Primary Care Tosca Pletz: [name] Other Clinician: Referring Amran Malter: Treating Anuj Summons/Extender: [name] in Treatment: 27 Compression Therapy Performed for Wound Assessment: Wound #7R Left,Circumferential Lower Leg Performed By: Clinician Levan Hurst, RN Compression Type: Three Layer Post Procedure Diagnosis Same as Pre-procedure Electronic Signature(s) Signed: 12/24/2020 3:59:15 PM By: Levan Hurst RN, BSN Entered By: Levan Hurst on 12/20/2020 13:35:56 -------------------------------------------------------------------------------- Encounter Discharge Information Details Patient Name: Date of Service: [name], [place] 12/20/2020 1:15 PM Medical Record Number: [id number] Patient Account Number: [id number] Date of Birth/Sex: Treating RN: [birth year] (87 y.o. [name] Primary Care Tiyah Zelenak: [name] Other Clinician: Referring Derrius Furtick: Treating Jenay Morici/Extender: [name] in Treatment: 50 Encounter Discharge Information Items Discharge Condition: Stable Ambulatory Status: Walker Discharge Destination: Home Transportation: Private Auto Accompanied By: alone Schedule Follow-up Appointment:  Yes Clinical Summary of Care: Patient Declined Electronic Signature(s) Signed: 12/24/2020 3:59:15 PM By: Levan Hurst RN, BSN Entered By: Levan Hurst on 12/20/2020 17:17:34 -------------------------------------------------------------------------------- Lower Extremity Assessment Details Patient Name: Date of Service: [name], [name]. 12/20/2020 1:15 PM Medical Record Number: [id number] Patient Account Number: [id number] Date of Birth/Sex: Treating RN: December 16, 1925 (87 y.o. [name] Primary Care Merritt Kibby: [name] Other Clinician: Referring Karrin Eisenmenger: Treating Keionna Kinnaird/Extender: [name] in Treatment: 27 Edema Assessment Assessed: [Left: No] [Right: No] Edema: [Left: Yes] [Right: Yes] Calf Left: Right: Point of Measurement: 30 cm From Medial Instep  34.5 cm 32 cm Ankle Left: Right: Point of Measurement: 9 cm From Medial Instep 23 cm 21 cm Vascular Assessment Pulses: Dorsalis Pedis Palpable: [Left:Yes] [Right:Yes] Electronic Signature(s) Signed: 12/24/2020 3:59:15 PM By: Levan Hurst RN, BSN Entered By: Levan Hurst on 12/20/2020 13:17:12 -------------------------------------------------------------------------------- Multi Wound Chart Details Patient Name: Date of Service: [name], [place]. 12/20/2020 1:15 PM Medical Record Number: [id number] Patient Account Number: [id number] Date of Birth/Sex: Treating RN: July 01, 1925 (87 y.o. F) Primary Care Anselmo Reihl: [name] Other Clinician: Referring Elnor Renovato: Treating Jaysin Gayler/Extender: [name] in Treatment: 82 Vital Signs Height(in): 65 Pulse(bpm): 4 Weight(lbs): 163 Blood Pressure(mmHg): 130/70 Body Mass Index(BMI): 27 Temperature(F): 97.6 Respiratory Rate(breaths/min): 16 Photos: [N/A:N/A] Right, Posterior Lower Leg Left, Anterior, Circumferential Lower N/A Wound Location: Leg Gradually Appeared Blister  N/A Wounding Event: Venous Leg Ulcer Malignant Wound N/A Primary Etiology: Anemia, Arrhythmia, Congestive Heart Anemia, Arrhythmia, Congestive Heart N/A Comorbid History: Failure, Hypertension, Colitis, Failure, Hypertension, Colitis, Osteoarthritis Osteoarthritis 11/08/2020 04/20/2020 N/A Date Acquired: 6 27 N/A Weeks of Treatment: Open Open N/A Wound Status: No Yes N/A Wound Recurrence: Yes No N/A Clustered Wound: 7 N/A N/A Clustered Quantity: 0.2x0.2x0.1 9x11x0.1 N/A Measurements L x W x D (cm) 0.031 77.754 N/A A (cm) : rea 0.003 7.775 N/A Volume (cm) : 99.20% -2431.90% N/A % Reduction in Area: 99.20% -2432.60% N/A % Reduction in Volume: Full Thickness Without Exposed Full Thickness Without Exposed N/A Classification: Support Structures Support Structures Medium Medium N/A Exudate Amount: Serosanguineous Serosanguineous N/A Exudate Type: red, brown red, brown N/A Exudate Color: Distinct, outline attached Distinct, outline attached N/A Wound Margin: Large (67-100%) Large (67-100%) N/A Granulation Amount: Red, Pink Red N/A Granulation Quality: None Present (0%) None Present (0%) N/A Necrotic Amount: Fat Layer (Subcutaneous Tissue): Yes Fat Layer (Subcutaneous Tissue): Yes N/A Exposed Structures: Fascia: No Fascia: No Tendon: No Tendon: No Muscle: No Muscle: No Joint: No Joint: No Bone: No Bone: No Large (67-100%) Medium (34-66%) N/A Epithelialization: Compression Therapy Compression Therapy N/A Procedures Performed: Treatment Notes Electronic Signature(s) Signed: 12/20/2020 1:54:20 PM By: Kalman Shan DO Entered By: Kalman Shan on 12/20/2020 13:49:36 -------------------------------------------------------------------------------- Multi-Disciplinary Care Plan Details Patient Name: Date of Service: [name], [place] A. 12/20/2020 1:15 PM Medical Record Number: [id number] Patient Account Number: [id number] Date of Birth/Sex: Treating  RN: 08/09/1925 (87 y.o. [name] Primary Care Georgine Wiltse: [name] Other Clinician: Referring Maxwell Lemen: Treating Roniyah Llorens/Extender: [name] in Treatment: 70 Multidisciplinary Care Plan reviewed with physician Active Inactive Venous Leg Ulcer Nursing Diagnoses: Knowledge deficit related to disease process and management Potential for venous Insuffiency (use before diagnosis confirmed) Goals: Patient will maintain optimal edema control Date Initiated: 11/01/2020 Target Resolution Date: 12/27/2020 Goal Status: Active Interventions: Assess peripheral edema status every visit. Compression as ordered Provide education on venous insufficiency Treatment Activities: Therapeutic compression applied : 11/01/2020 Notes: Wound/Skin Impairment Nursing Diagnoses: Impaired tissue integrity Knowledge deficit related to ulceration/compromised skin integrity Goals: Patient/caregiver will verbalize understanding of skin care regimen Date Initiated: 06/11/2020 Target Resolution Date: 12/27/2020 Goal Status: Active Interventions: Assess patient/caregiver ability to obtain necessary supplies Assess patient/caregiver ability to perform ulcer/skin care regimen upon admission and as needed Assess ulceration(s) every visit Provide education on ulcer and skin care Notes: 10/11/20: Wound care regimen ongoing. Electronic Signature(s) Signed: 12/24/2020 3:59:15 PM By: Levan Hurst RN, BSN Entered By: Levan Hurst on 12/20/2020 13:33:06 -------------------------------------------------------------------------------- Pain Assessment Details Patient Name: Date of Service: [name], [place]. 12/20/2020 1:15 PM Medical Record Number: [id number] Patient  Account Number: [id number] Date of Birth/Sex: Treating RN: 09/24/1925 (87 y.o. [name] Primary Care Madonna Flegal: [name] Other Clinician: Referring Rondell Pardon: Treating  Money Mckeithan/Extender: [name] in Treatment: 27 Active Problems Location of Pain Severity and Description of Pain Patient Has Paino No Site Locations Pain Management and Medication Current Pain Management: Electronic Signature(s) Signed: 12/24/2020 3:59:15 PM By: Levan Hurst RN, BSN Entered By: Levan Hurst on 12/20/2020 13:01:26 -------------------------------------------------------------------------------- Patient/Caregiver Education Details Patient Name: Date of Service: [name], [place] 12/1/2022andnbsp1:15 PM Medical Record Number: [id number] Patient Account Number: [id number] Date of Birth/Gender: Treating RN: Mar 18, 1925 (87 y.o. [name] Primary Care Physician: [name] Other Clinician: Referring Physician: Treating Physician/Extender: [name] in Treatment: 74 Education Assessment Education Provided To: Patient Education Topics Provided Venous: Methods: Explain/Verbal Responses: State content correctly Wound/Skin Impairment: Methods: Explain/Verbal Responses: State content correctly Electronic Signature(s) Signed: 12/24/2020 3:59:15 PM By: Levan Hurst RN, BSN Entered By: Levan Hurst on 12/20/2020 13:33:39 -------------------------------------------------------------------------------- Wound Assessment Details Patient Name: Date of Service: [name], [place]. 12/20/2020 1:15 PM Medical Record Number: [id number] Patient Account Number: [id number] Date of Birth/Sex: Treating RN: March 01, 1925 (86 y.o. [name] Primary Care Mark Hassey: [name] Other Clinician: Referring Hunter Bachar: Treating Kati Riggenbach/Extender: [name] in Treatment: 27 Wound Status Wound Number: 15 Primary Venous Leg Ulcer Etiology: Wound Location: Right, Posterior Lower Leg Wound Open Wounding Event: Gradually Appeared Status: Date Acquired:  11/08/2020 Comorbid Anemia, Arrhythmia, Congestive Heart Failure, Hypertension, Weeks Of Treatment: 6 History: Colitis, Osteoarthritis Clustered Wound: Yes Photos Wound Measurements Length: (cm) 0.2 Width: (cm) 0.2 Depth: (cm) 0.1 Clustered Quantity: 7 Area: (cm) 0.031 Volume: (cm) 0.003 % Reduction in Area: 99.2% % Reduction in Volume: 99.2% Epithelialization: Large (67-100%) Tunneling: No Undermining: No Wound Description Classification: Full Thickness Without Exposed Support Structures Wound Margin: Distinct, outline attached Exudate Amount: Medium Exudate Type: Serosanguineous Exudate Color: red, brown Foul Odor After Cleansing: No Slough/Fibrino No Wound Bed Granulation Amount: Large (67-100%) Exposed Structure Granulation Quality: Red, Pink Fascia Exposed: No Necrotic Amount: None Present (0%) Fat Layer (Subcutaneous Tissue) Exposed: Yes Tendon Exposed: No Muscle Exposed: No Joint Exposed: No Bone Exposed: No Treatment Notes Wound #15 (Lower Leg) Wound Laterality: Right, Posterior Cleanser Soap and Water Discharge Instruction: May shower and wash wound with dial antibacterial soap and water prior to dressing change. Wound Cleanser Discharge Instruction: Cleanse the wound with wound cleanser prior to applying a clean dressing using gauze sponges, not tissue or cotton balls. Peri-Wound Care Triamcinolone 15 (g) Discharge Instruction: In clinic only. Use triamcinolone 15 (g) in clinic only. Sween Lotion (Moisturizing lotion) Discharge Instruction: Apply moisturizing lotion as directed Topical Primary Dressing KerraCel Ag Gelling Fiber Dressing, 4x5 in (silver alginate) Discharge Instruction: Apply silver alginate to wound bed as instructed Secondary Dressing Woven Gauze Sponge, Non-Sterile 4x4 in Discharge Instruction: Apply over primary dressing as directed. ABD Pad, 8x10 Discharge Instruction: Apply over primary dressing as directed. Secured  With Compression Wrap ThreePress (3 layer compression wrap) Discharge Instruction: Apply three layer compression as directed. Compression Stockings Add-Ons Electronic Signature(s) Signed: 12/24/2020 3:59:15 PM By: Levan Hurst RN, BSN Entered By: Levan Hurst on 12/20/2020 13:19:46 -------------------------------------------------------------------------------- Wound Assessment Details Patient Name: Date of Service: [name], [place] 12/20/2020 1:15 PM Medical Record Number: [id number] Patient Account Number: [id number] Date of Birth/Sex: Treating RN: Oct 09, 1925 (87 y.o. [name] Primary Care Inge Waldroup: [name] Other Clinician: Referring Maddalynn Barnard: Treating Julie-Ann Vanmaanen/Extender: [place] ,  Jessica [name], [name] Weeks in Treatment: 27 Wound Status Wound Number: 7R Primary Malignant Wound Etiology: Wound Location: Left, Anterior, Circumferential Lower Leg Wound Open Wounding Event: Blister Status: Date Acquired: 04/20/2020 Comorbid Anemia, Arrhythmia, Congestive Heart Failure, Hypertension, Weeks Of Treatment: 27 History: Colitis, Osteoarthritis Clustered Wound: No Photos Wound Measurements Length: (cm) 9 Width: (cm) 11 Depth: (cm) 0.1 Area: (cm) 77.754 Volume: (cm) 7.775 % Reduction in Area: -2431.9% % Reduction in Volume: -2432.6% Epithelialization: Medium (34-66%) Tunneling: No Undermining: No Wound Description Classification: Full Thickness Without Exposed Support Structures Wound Margin: Distinct, outline attached Exudate Amount: Medium Exudate Type: Serosanguineous Exudate Color: red, brown Foul Odor After Cleansing: No Slough/Fibrino No Wound Bed Granulation Amount: Large (67-100%) Exposed Structure Granulation Quality: Red Fascia Exposed: No Necrotic Amount: None Present (0%) Fat Layer (Subcutaneous Tissue) Exposed: Yes Tendon Exposed: No Muscle Exposed: No Joint Exposed: No Bone Exposed: No Electronic Signature(s) Signed:  12/24/2020 3:59:15 PM By: Levan Hurst RN, BSN Entered By: Levan Hurst on 12/20/2020 13:20:30 -------------------------------------------------------------------------------- Vitals Details Patient Name: Date of Service: [name], [place] A. 12/20/2020 1:15 PM Medical Record Number: [id number] Patient Account Number: [id number] Date of Birth/Sex: Treating RN: [birth year] (87 y.o. [name] Primary Care Tyden Kann: [name] Other Clinician: Referring Arick Mareno: Treating Gedalya Jim/Extender: [name] in Treatment: 43 Vital Signs Time Taken: 13:00 Temperature (F): 97.6 Height (in): 65 Pulse (bpm): 58 Weight (lbs): 163 Respiratory Rate (breaths/min): 16 Body Mass Index (BMI): 27.1 Blood Pressure (mmHg): 130/70 Reference Range: 80 - 120 mg / dl Electronic Signature(s) Signed: 12/24/2020 3:59:15 PM By: Levan Hurst RN, BSN Entered By: Levan Hurst on 12/20/2020 13:01:09

## 2020-12-25 DIAGNOSIS — Z20828 Contact with and (suspected) exposure to other viral communicable diseases: Secondary | ICD-10-CM | POA: Diagnosis not present

## 2020-12-25 DIAGNOSIS — Z789 Other specified health status: Secondary | ICD-10-CM | POA: Diagnosis not present

## 2020-12-26 ENCOUNTER — Encounter (HOSPITAL_BASED_OUTPATIENT_CLINIC_OR_DEPARTMENT_OTHER): Payer: Medicare Other | Admitting: Internal Medicine

## 2020-12-27 ENCOUNTER — Encounter (HOSPITAL_BASED_OUTPATIENT_CLINIC_OR_DEPARTMENT_OTHER): Payer: Medicare Other | Admitting: Internal Medicine

## 2020-12-27 ENCOUNTER — Other Ambulatory Visit: Payer: Self-pay

## 2020-12-27 DIAGNOSIS — L97829 Non-pressure chronic ulcer of other part of left lower leg with unspecified severity: Secondary | ICD-10-CM

## 2020-12-27 DIAGNOSIS — I110 Hypertensive heart disease with heart failure: Secondary | ICD-10-CM | POA: Diagnosis not present

## 2020-12-27 DIAGNOSIS — C4492 Squamous cell carcinoma of skin, unspecified: Secondary | ICD-10-CM

## 2020-12-27 DIAGNOSIS — I872 Venous insufficiency (chronic) (peripheral): Secondary | ICD-10-CM

## 2020-12-27 DIAGNOSIS — L97812 Non-pressure chronic ulcer of other part of right lower leg with fat layer exposed: Secondary | ICD-10-CM | POA: Diagnosis not present

## 2020-12-27 DIAGNOSIS — L97819 Non-pressure chronic ulcer of other part of right lower leg with unspecified severity: Secondary | ICD-10-CM

## 2020-12-27 DIAGNOSIS — I509 Heart failure, unspecified: Secondary | ICD-10-CM | POA: Diagnosis not present

## 2020-12-27 DIAGNOSIS — L97822 Non-pressure chronic ulcer of other part of left lower leg with fat layer exposed: Secondary | ICD-10-CM | POA: Diagnosis not present

## 2020-12-27 NOTE — Progress Notes (Signed)
KEARA PAGLIARULO ([id number]) , Visit Report for 12/27/2020 Arrival Information Details Patient Name: Date of Service: [name], [place] 12/27/2020 1:15 PM Medical Record Number: [id number] Patient Account Number: [id number] Date of Birth/Sex: Treating RN: [birth year] (87 y.o. America Brown Primary Care Arizona Nordquist: [name] Other Clinician: Referring Adamae Ricklefs: Treating Suren Payne/Extender: [name] in Treatment: 28 Visit Information History Since Last Visit Added or deleted any medications: No Patient Arrived: Walker Any new allergies or adverse reactions: No Arrival Time: 13:45 Had a fall or experienced change in No Accompanied By: self activities of daily living that may affect Transfer Assistance: Manual risk of falls: Patient Identification Verified: Yes Signs or symptoms of abuse/neglect since last visito No Patient Requires Transmission-Based No Hospitalized since last visit: No Precautions: Implantable device outside of the clinic excluding No Patient Has Alerts: Yes cellular tissue based products placed in the center Patient Alerts: R ABI = Non compressible since last visit: L ABI = Non compressible Has Dressing in Place as Prescribed: Yes Pain Present Now: No Electronic Signature(s) Signed: 12/27/2020 5:19:04 PM By: Dellie Catholic RN Entered By: Dellie Catholic on 12/27/2020 13:48:30 -------------------------------------------------------------------------------- Compression Therapy Details Patient Name: Date of Service: [name], [place] A. 12/27/2020 1:15 PM Medical Record Number: [id number] Patient Account Number: [id number] Date of Birth/Sex: Treating RN: 1925/08/13 (87 y.o. America Brown Primary Care Serai Tukes: [name] Other Clinician: Referring Rosalynd Mcwright: Treating Danyael Alipio/Extender: [name] in Treatment: 28 Compression Therapy Performed for Wound Assessment: Wound  #15 Right,Posterior Lower Leg Performed By: Clinician Dellie Catholic, RN Compression Type: Three Layer Post Procedure Diagnosis Same as Pre-procedure Electronic Signature(s) Signed: 12/27/2020 5:19:04 PM By: Dellie Catholic RN Entered By: Dellie Catholic on 12/27/2020 14:11:32 -------------------------------------------------------------------------------- Compression Therapy Details Patient Name: Date of Service: [name], [place]. 12/27/2020 1:15 PM Medical Record Number: [id number] Patient Account Number: [id number] Date of Birth/Sex: Treating RN: 1925-06-16 (87 y.o. America Brown Primary Care Sarita Hakanson: [name] Other Clinician: Referring Dayana Dalporto: Treating Yuriana Gaal/Extender: [name] in Treatment: 28 Compression Therapy Performed for Wound Assessment: Wound #7R Left,Circumferential Lower Leg Performed By: Clinician Dellie Catholic, RN Compression Type: Three Layer Post Procedure Diagnosis Same as Pre-procedure Electronic Signature(s) Signed: 12/27/2020 5:19:04 PM By: Dellie Catholic RN Entered By: Dellie Catholic on 12/27/2020 14:11:32 -------------------------------------------------------------------------------- Encounter Discharge Information Details Patient Name: Date of Service: [name], [place] 12/27/2020 1:15 PM Medical Record Number: [id number] Patient Account Number: [id number] Date of Birth/Sex: Treating RN: Jun 27, 1925 (87 y.o. America Brown Primary Care Fronie Holstein: [name] Other Clinician: Referring Zayne Draheim: Treating Nichalos Brenton/Extender: [name] in Treatment: 28 Encounter Discharge Information Items Discharge Condition: Stable Ambulatory Status: Walker Discharge Destination: Home Transportation: Private Auto Accompanied By: self Schedule Follow-up Appointment: No Clinical Summary of Care: Electronic Signature(s) Signed: 12/27/2020 5:19:04 PM By:  Dellie Catholic RN Entered By: Dellie Catholic on 12/27/2020 15:03:42 -------------------------------------------------------------------------------- Lower Extremity Assessment Details Patient Name: Date of Service: [name], [place] 12/27/2020 1:15 PM Medical Record Number: [id number] Patient Account Number: [id number] Date of Birth/Sex: Treating RN: May 02, 1925 (87 y.o. America Brown Primary Care Nellie Pester: [name] Other Clinician: Referring Amunique Neyra: Treating Mickell Birdwell/Extender: [name] in Treatment: 28 Edema Assessment Assessed: [Left: No] [Right: No] Edema: [Left: Yes] [Right: Yes] Calf Left: Right: Point of Measurement: 30 cm From Medial Instep 33 cm 31 cm Ankle Left: Right: Point of Measurement: 9 cm From Medial Instep 23 cm 21.3  cm Electronic Signature(s) Signed: 12/27/2020 5:19:04 PM By: Dellie Catholic RN Entered By: Dellie Catholic on 12/27/2020 13:55:23 -------------------------------------------------------------------------------- Multi Wound Chart Details Patient Name: Date of Service: [name], [place] 12/27/2020 1:15 PM Medical Record Number: [id number] Patient Account Number: [id number] Date of Birth/Sex: Treating RN: 1925/12/01 (87 y.o. F) Primary Care Tkeyah Burkman: [name] Other Clinician: Referring Rotha Cassels: Treating Eilah Common/Extender: [name] in Treatment: 28 Vital Signs Height(in): 65 Pulse(bpm): 80 Weight(lbs): 163 Blood Pressure(mmHg): 123/72 Body Mass Index(BMI): 27 Temperature(F): 97.4 Respiratory Rate(breaths/min): 18 Photos: [15:No Photos Right, Posterior Lower Leg] [7R:No Photos Left, Circumferential Lower Leg] [N/A:N/A N/A] Wound Location: [15:Gradually Appeared] [7R:Blister] [N/A:N/A] Wounding Event: [15:Venous Leg Ulcer] [7R:Malignant Wound] [N/A:N/A] Primary Etiology: [15:Anemia, Arrhythmia, Congestive Heart Anemia, Arrhythmia, Congestive  Heart N/A] Comorbid History: [15:Failure, Hypertension, Colitis, Osteoarthritis 11/08/2020] [7R:Failure, Hypertension, Colitis, Osteoarthritis 04/20/2020] [N/A:N/A] Date Acquired: [15:7] [7R:28] [N/A:N/A] Weeks of Treatment: [15:Open] [7R:Open] [N/A:N/A] Wound Status: [15:No] [7R:Yes] [N/A:N/A] Wound Recurrence: [15:Yes] [7R:No] [N/A:N/A] Clustered Wound: [15:7] [7R:N/A] [N/A:N/A] Clustered Quantity: [15:0.3x1x0.1] [7R:5.5x6x0.1] [N/A:N/A] Measurements L x W x D (cm) [15:0.236] [9G:29.528] [N/A:N/A] A (cm) : rea [15:0.024] [7R:2.592] [N/A:N/A] Volume (cm) : [15:94.00%] [7R:-744.00%] [N/A:N/A] % Reduction in Area: [15:93.90%] [7R:-744.30%] [N/A:N/A] % Reduction in Volume: [15:Full Thickness Without Exposed] [7R:Full Thickness Without Exposed] [N/A:N/A] Classification: [15:Support Structures Medium] [7R:Support Structures Medium] [N/A:N/A] Exudate Amount: [15:Serosanguineous] [7R:Serosanguineous] [N/A:N/A] Exudate Type: [15:red, brown] [7R:red, brown] [N/A:N/A] Exudate Color: [15:Distinct, outline attached] [7R:Distinct, outline attached] [N/A:N/A] Wound Margin: [15:Large (67-100%)] [7R:Large (67-100%)] [N/A:N/A] Granulation Amount: [15:Red, Pink] [7R:Red] [N/A:N/A] Granulation Quality: [15:None Present (0%)] [7R:None Present (0%)] [N/A:N/A] Necrotic Amount: [15:Fat Layer (Subcutaneous Tissue): Yes Fat Layer (Subcutaneous Tissue): Yes N/A] Exposed Structures: [15:Fascia: No Tendon: No Muscle: No Joint: No Bone: No Large (67-100%)] [7R:Fascia: No Tendon: No Muscle: No Joint: No Bone: No Medium (34-66%)] [N/A:N/A] Epithelialization: [15:Compression Therapy] [7R:Compression Therapy] [N/A:N/A] Treatment Notes Electronic Signature(s) Signed: 12/27/2020 2:19:45 PM By: Kalman Shan DO Entered By: Kalman Shan on 12/27/2020 14:17:03 -------------------------------------------------------------------------------- Multi-Disciplinary Care Plan Details Patient Name: Date of Service: KIV  ETT, [place] A. 12/27/2020 1:15 PM Medical Record Number: [id number] Patient Account Number: [id number] Date of Birth/Sex: Treating RN: [birth year] (87 y.o. America Brown Primary Care Jocelyn Lowery: [name] Other Clinician: Referring Shlonda Dolloff: Treating Mailynn Everly/Extender: [name] in Treatment: 28 Multidisciplinary Care Plan reviewed with physician Active Inactive Venous Leg Ulcer Nursing Diagnoses: Knowledge deficit related to disease process and management Potential for venous Insuffiency (use before diagnosis confirmed) Goals: Patient will maintain optimal edema control Date Initiated: 11/01/2020 Target Resolution Date: 01/24/2021 Goal Status: Active Interventions: Assess peripheral edema status every visit. Compression as ordered Provide education on venous insufficiency Treatment Activities: Therapeutic compression applied : 11/01/2020 Notes: 12/27/20: Edema control ongoing Wound/Skin Impairment Nursing Diagnoses: Impaired tissue integrity Knowledge deficit related to ulceration/compromised skin integrity Goals: Patient/caregiver will verbalize understanding of skin care regimen Date Initiated: 06/11/2020 Target Resolution Date: 01/24/2021 Goal Status: Active Interventions: Assess patient/caregiver ability to obtain necessary supplies Assess patient/caregiver ability to perform ulcer/skin care regimen upon admission and as needed Assess ulceration(s) every visit Provide education on ulcer and skin care Notes: 10/11/20: Wound care regimen ongoing. 12/27/20: Wound care continues Electronic Signature(s) Signed: 12/27/2020 5:19:04 PM By: Dellie Catholic RN Entered By: Dellie Catholic on 12/27/2020 13:59:20 -------------------------------------------------------------------------------- Pain Assessment Details Patient Name: Date of Service: [name], [place] 12/27/2020 1:15 PM Medical Record Number: [id number] Patient Account Number:  [id number] Date of Birth/Sex: Treating RN: October 20, 1925 (45 y.o. F) Dellie Catholic Primary Care Akshath Mccarey: [name],  [name] Other Clinician: Referring Venora Kautzman: Treating Arihant Pennings/Extender: [name] in Treatment: 74 Active Problems Location of Pain Severity and Description of Pain Patient Has Paino No Site Locations Pain Management and Medication Current Pain Management: Electronic Signature(s) Signed: 12/27/2020 5:19:04 PM By: Dellie Catholic RN Entered By: Dellie Catholic on 12/27/2020 13:49:19 -------------------------------------------------------------------------------- Patient/Caregiver Education Details Patient Name: Date of Service: [name], [place] 12/8/2022andnbsp1:15 PM Medical Record Number: [id number] Patient Account Number: [id number] Date of Birth/Gender: Treating RN: 05/27/1925 (87 y.o. America Brown Primary Care Physician: [name] Other Clinician: Referring Physician: Treating Physician/Extender: [name] in Treatment: 56 Education Assessment Education Provided To: Patient Education Topics Provided Venous: Methods: Explain/Verbal, Printed Responses: State content correctly Wound/Skin Impairment: Methods: Explain/Verbal, Printed Responses: State content correctly Electronic Signature(s) Signed: 12/27/2020 5:19:04 PM By: Dellie Catholic RN Entered By: Dellie Catholic on 12/27/2020 13:59:52 -------------------------------------------------------------------------------- Wound Assessment Details Patient Name: Date of Service: [name], [place]. 12/27/2020 1:15 PM Medical Record Number: [id number] Patient Account Number: [id number] Date of Birth/Sex: Treating RN: 1925-04-09 (87 y.o. America Brown Primary Care Camie Hauss: [name] Other Clinician: Referring Kaydan Wong: Treating Jacquita Mulhearn/Extender: [name] in  Treatment: 28 Wound Status Wound Number: 15 Primary Venous Leg Ulcer Etiology: Wound Location: Right, Posterior Lower Leg Wound Open Wounding Event: Gradually Appeared Status: Date Acquired: 11/08/2020 Comorbid Anemia, Arrhythmia, Congestive Heart Failure, Hypertension, Weeks Of Treatment: 7 History: Colitis, Osteoarthritis Clustered Wound: Yes Photos Photo Uploaded By: Levan Hurst on 12/27/2020 17:01:15 Wound Measurements Length: (cm) 0.3 Width: (cm) 1 Depth: (cm) 0.1 Clustered Quantity: 7 Area: (cm) 0.236 Volume: (cm) 0.024 % Reduction in Area: 94% % Reduction in Volume: 93.9% Epithelialization: Large (67-100%) Tunneling: No Undermining: No Wound Description Classification: Full Thickness Without Exposed Support Structures Wound Margin: Distinct, outline attached Exudate Amount: Medium Exudate Type: Serosanguineous Exudate Color: red, brown Foul Odor After Cleansing: No Slough/Fibrino No Wound Bed Granulation Amount: Large (67-100%) Exposed Structure Granulation Quality: Red, Pink Fascia Exposed: No Necrotic Amount: None Present (0%) Fat Layer (Subcutaneous Tissue) Exposed: Yes Tendon Exposed: No Muscle Exposed: No Joint Exposed: No Bone Exposed: No Treatment Notes Wound #15 (Lower Leg) Wound Laterality: Right, Posterior Cleanser Soap and Water Discharge Instruction: May shower and wash wound with dial antibacterial soap and water prior to dressing change. Wound Cleanser Discharge Instruction: Cleanse the wound with wound cleanser prior to applying a clean dressing using gauze sponges, not tissue or cotton balls. Peri-Wound Care Triamcinolone 15 (g) Discharge Instruction: In clinic only. Use triamcinolone 15 (g) in clinic only. Sween Lotion (Moisturizing lotion) Discharge Instruction: Apply moisturizing lotion as directed Topical Primary Dressing KerraCel Ag Gelling Fiber Dressing, 4x5 in (silver alginate) Discharge Instruction: Apply silver alginate  to wound bed as instructed Secondary Dressing Woven Gauze Sponge, Non-Sterile 4x4 in Discharge Instruction: Apply over primary dressing as directed. ABD Pad, 8x10 Discharge Instruction: Apply over primary dressing as directed. Secured With Compression Wrap ThreePress (3 layer compression wrap) Discharge Instruction: Apply three layer compression as directed. Compression Stockings Add-Ons Electronic Signature(s) Signed: 12/27/2020 5:19:04 PM By: Dellie Catholic RN Entered By: Dellie Catholic on 12/27/2020 14:12:35 -------------------------------------------------------------------------------- Wound Assessment Details Patient Name: Date of Service: [name], [place] 12/27/2020 1:15 PM Medical Record Number: [id number] Patient Account Number: [id number] Date of Birth/Sex: Treating RN: 01-22-1925 (87 y.o. America Brown Primary Care Retaj Hilbun: [name] Other Clinician: Referring Cena Bruhn: Treating Britzy Graul/Extender: [name] in Treatment: 28 Wound Status Wound Number:  7R Primary Malignant Wound Etiology: Wound Location: Left, Circumferential Lower Leg Wound Open Wounding Event: Blister Status: Date Acquired: 04/20/2020 Comorbid Anemia, Arrhythmia, Congestive Heart Failure, Hypertension, Weeks Of Treatment: 28 History: Colitis, Osteoarthritis Clustered Wound: No Photos Photo Uploaded By: Levan Hurst on 12/27/2020 17:01:15 Wound Measurements Length: (cm) 5.5 Width: (cm) 6 Depth: (cm) 0.1 Area: (cm) 25.918 Volume: (cm) 2.592 % Reduction in Area: -744% % Reduction in Volume: -744.3% Epithelialization: Medium (34-66%) Tunneling: No Undermining: No Wound Description Classification: Full Thickness Without Exposed Support Structures Wound Margin: Distinct, outline attached Exudate Amount: Medium Exudate Type: Serosanguineous Exudate Color: red, brown Foul Odor After Cleansing: No Slough/Fibrino No Wound  Bed Granulation Amount: Large (67-100%) Exposed Structure Granulation Quality: Red Fascia Exposed: No Necrotic Amount: None Present (0%) Fat Layer (Subcutaneous Tissue) Exposed: Yes Tendon Exposed: No Muscle Exposed: No Joint Exposed: No Bone Exposed: No Treatment Notes Wound #7R (Lower Leg) Wound Laterality: Left, Circumferential Cleanser Soap and Water Discharge Instruction: May shower and wash wound with dial antibacterial soap and water prior to dressing change. Wound Cleanser Discharge Instruction: Cleanse the wound with wound cleanser prior to applying a clean dressing using gauze sponges, not tissue or cotton balls. Peri-Wound Care Triamcinolone 15 (g) Discharge Instruction: In clinic only. Use triamcinolone 15 (g) in clinic only. Sween Lotion (Moisturizing lotion) Discharge Instruction: Apply moisturizing lotion as directed Topical Primary Dressing KerraCel Ag Gelling Fiber Dressing, 4x5 in (silver alginate) Discharge Instruction: Apply silver alginate to wound bed as instructed Secondary Dressing Woven Gauze Sponge, Non-Sterile 4x4 in Discharge Instruction: Apply over primary dressing as directed. ABD Pad, 8x10 Discharge Instruction: Apply over primary dressing as directed. Secured With Compression Wrap ThreePress (3 layer compression wrap) Discharge Instruction: Apply three layer compression as directed. Compression Stockings Add-Ons Electronic Signature(s) Signed: 12/27/2020 5:19:04 PM By: Dellie Catholic RN Entered By: Dellie Catholic on 12/27/2020 13:57:33 -------------------------------------------------------------------------------- Vitals Details Patient Name: Date of Service: [name], [place] 12/27/2020 1:15 PM Medical Record Number: [id number] Patient Account Number: [id number] Date of Birth/Sex: Treating RN: [birth year] (87 y.o. America Brown Primary Care Tranice Laduke: [name] Other Clinician: Referring Genea Rheaume: Treating  Seena Face/Extender: [name] in Treatment: 28 Vital Signs Time Taken: 13:48 Temperature (F): 97.4 Height (in): 65 Pulse (bpm): 80 Weight (lbs): 163 Respiratory Rate (breaths/min): 18 Body Mass Index (BMI): 27.1 Blood Pressure (mmHg): 123/72 Reference Range: 80 - 120 mg / dl Electronic Signature(s) Signed: 12/27/2020 5:19:04 PM By: Dellie Catholic RN Entered By: Dellie Catholic on 12/27/2020 13:49:02

## 2020-12-27 NOTE — Progress Notes (Signed)
[name] ([id number]) , Visit Report for 12/27/2020 Chief Complaint Document Details Patient Name: Date of Service: [name], [place] 12/27/2020 1:15 PM Medical Record Number: [id number] Patient Account Number: [id number] Date of Birth/Sex: Treating RN: 1925/04/18 (87 y.o. F) Primary Care Provider: [name] Other Clinician: Referring Provider: Treating Provider/Extender: [name] in Treatment: 28 Information Obtained from: Patient Chief Complaint Bilateral lower extremity wounds that have been biopsied and positive for squamous cell carcinoma Electronic Signature(s) Signed: 12/27/2020 2:19:45 PM By: Kalman Shan DO Entered By: Kalman Shan on 12/27/2020 14:17:11 -------------------------------------------------------------------------------- HPI Details Patient Name: Date of Service: [name], [name] 12/27/2020 1:15 PM Medical Record Number: [id number] Patient Account Number: [id number] Date of Birth/Sex: Treating RN: [birth year] (87 y.o. 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 She states that the wound on the right has been present for a year. The 1 on the left has opened up 1 month ago. She is followed with oncology for this issue as she had biopsies that showed squamous cell carcinoma. She is also seeing radiation oncology for treatment options. She presents today because she would like for her wounds to be healed by [place]. She currently denies  signs of infection. 6/1; patient presents for 1 week follow-up. She states she has tolerated the leg wraps well. She states these do not bother her and is happy to continue with them. She is scheduled to see her oncologist today to go over treatment options for the bilateral lower extremity squamous cell carcinoma. Radiation is currently not a recommended option. Patient states she overall feels well. 6/22; patient presents for 3-week follow-up. She has tolerated the wraps well until her last wrap where she states they were uncomfortable. She attributes this to the home health nurse. She denies signs of infection. She has started her first treatment of antibody infusions for her Bilateral lower extremity squamous cell carcinoma. She has no complaints today. 7/21; patient presents for 1 month follow-up. Unfortunately she has not had good experience with her wrap changes with home health. She would like to do her own dressing changes. She continues to do her antibody infusions. She denies signs of infection. 7/28; patient presents for 1 week follow-up. At last clinic visit she was switched to daily dressing changes due to issues with the wrap and home health placing them. Unfortunately she has developed weeping to her legs bilaterally. She would like to be placed in wraps today. She would also like to follow with [place] weekly for wrap changes instead of having home health change them. She denies signs of infection. 8/4; patient presents for 1 week follow-up. She has tolerated the Kerlix/Coban wraps well. She no longer has weeping to her legs. She took the wrap off 1 day before coming in to be able to take a shower. She has no issues or complaints today. She denies signs of infection. 8/18; patient presents for follow-up. Patient has tolerated the wraps well. She brought her Velcro compression wraps today. She has no issues or complaints today. She had her chemotherapy infusion yesterday without issues. She  denies signs of infection. 8/25; patient presents for follow-up. She used her juxta lite compressions for the past week. It is unclear if she is able to put these on correctly since she states she has a hard time getting them to look right. She reports 2 open wounds. She currently denies signs of infection. 9/1; patient presents for 1 week follow-up. She has 1 open wound. She tolerated the compression wraps well. She currently denies signs of infection. 9/8; patient presents for 1 week follow-up. She has 2 open wounds 1 on each leg. She has tolerated the compression wrap well. She currently denies signs of infection. 9/15; patient presents for 1 week follow-up. She now has 3 wounds. 2 on the left and 1 on the right. She continues to tolerate the compression wrap well. She currently denies signs of infection. She has obtained furosemide by her primary care physician and would like to discuss when to take this. 9/22; patient presents for 1 week follow-up. She has scattered wounds on her lower extremities bilaterally. She did take furosemide twice in the past week. She does not recall having to urinate more frequently. She tolerated the 3 layer compression well. She denies signs of infection. 10/13; patient has not been here recently because of [place]. Apparently the facility was only putting gauze on her legs. This is a patient I do not normally see. 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 Ronnald Ramp at [facility] dermatology. She is quite convincing that she did not have radiation to her lower legs. It is likely she also has significant chronic venous insufficiency stasis dermatitis. We have been using silver alginate under kerlix Coban. She has obvious open areas medially on the right and areas on the left. She also has areas of extensive dry flaking adherent areas on the right and to a lesser extent on the left anterior.  Nodular areas on the left lateral lower leg left posterior calf and right mid calf medially. 10/21; patient presents for follow-up. She has no issues or complaints today. She has tolerated the 3 layer compression well bilaterally. She denies signs of infection. 10/27; patient presents for follow-up. She continues to tolerate the 3 layer compression wrap well. She denies signs of infection. 11/30; patient presents for follow-up. She has no issues or complaints today. 11/10; patient arrived in clinic today for nurse visit accompanied by her daughter from [place]. The daughter had multiple questions so we turned this into doctors visit. Apparently after the last time I saw this woman in October she went to see Dr. Ronnald Ramp but he did not take the wraps off. She previously was treated with Cemiplimab for her squamous cell carcinoma. She did not receive radiation to her legs. I had wanted Dr. Ronnald Ramp to look at this because of the exceptionally damaged skin on her lower legs bilaterally. She has odd looking wounds on the left medial lower leg also extending posteriorly which we have not made a lot of progress on. But she also has a raised hyperkeratotic nodules on the right anterior tibial area plaques on the left medial lower thigh. These are not areas that are under compression. We have been using silver alginate on any open areas Liberal TCA under 3 layer compression. 11/17; patient presents for follow-up. She had her wraps taken off yesterday for her dermatology appointment. She reports excessive weeping to her legs bilaterally after the wraps were taken off. She currently  denies signs of infection. 12/12/2020 upon evaluation today patient appears to be doing about the same in regard to her wounds. She was actually started on Cipro after having biopsies apparently at her dermatology clinic she does not have the results back from the actual biopsy but the culture did return and apparently they placed her on  the Cipro she started that just this morning. Other than that her legs appear to be doing okay at this time. 12/1; patient presents for follow-up. She has no issues or complaints today. She has started Lasix 40 mg daily to help with her bilateral leg swelling. 12/8; patient presents for follow-up. She has no issues or complaints today. Electronic Signature(s) Signed: 12/27/2020 2:19:45 PM By: Kalman Shan DO Entered By: Kalman Shan on 12/27/2020 14:17:27 -------------------------------------------------------------------------------- Physical Exam Details Patient Name: Date of Service: [name], [place] 12/27/2020 1:15 PM Medical Record Number: [id number] Patient Account Number: [id number] Date of Birth/Sex: Treating RN: 1925/10/29 (87 y.o. F) Primary Care Provider: [name] Other Clinician: Referring Provider: Treating Provider/Extender: [name] in Treatment: 28 Constitutional respirations regular, non-labored and within target range for patient.. Cardiovascular 2+ dorsalis pedis/posterior tibialis pulses. Psychiatric pleasant and cooperative. Notes Right lower extremity: Small open wound to the anterior aspect limited to skin breakdown. Left lower extremity: Open wound to the posterior aspect with granulation tissue present. Good edema control Electronic Signature(s) Signed: 12/27/2020 2:19:45 PM By: Kalman Shan DO Signed: 12/27/2020 2:19:45 PM By: Kalman Shan DO Entered By: Kalman Shan on 12/27/2020 14:18:00 -------------------------------------------------------------------------------- Physician Orders Details Patient Name: Date of Service: [name], [place] 12/27/2020 1:15 PM Medical Record Number: [id number] Patient Account Number: [id number] Date of Birth/Sex: Treating RN: 1925/09/29 (87 y.o. America Brown Primary Care Provider: [name] Other Clinician: Referring Provider: Treating  Provider/Extender: [name] in Treatment: 92 Verbal / Phone Orders: No Diagnosis Coding ICD-10 Coding Code Description C44.92 Squamous cell carcinoma of skin, unspecified L97.819 Non-pressure chronic ulcer of other part of right lower leg with unspecified severity L97.829 Non-pressure chronic ulcer of other part of left lower leg with unspecified severity I87.2 Venous insufficiency (chronic) (peripheral) Follow-up Appointments ppointment in 1 week. - with Dr. Heber [place]holt Return A Bathing/ Shower/ Hygiene May shower with protection but do not get wound dressing(s) wet. - Use cast protector Edema Control - Lymphedema / SCD / Other Elevate legs to the level of the heart or above for 30 minutes daily and/or when sitting, a frequency of: - elevate the legs throughout the day heart level if possible. Avoid standing for long periods of time. Exercise regularly Additional Orders / Instructions Follow Nutritious Diet Wound Treatment Wound #15 - Lower Leg Wound Laterality: Right, Posterior Cleanser: Soap and Water 1 x Per Week/15 Days Discharge Instructions: May shower and wash wound with dial antibacterial soap and water prior to dressing change. Cleanser: Wound Cleanser 1 x Per Week/15 Days Discharge Instructions: Cleanse the wound with wound cleanser prior to applying a clean dressing using gauze sponges, not tissue or cotton balls. Peri-Wound Care: Triamcinolone 15 (g) 1 x Per Week/15 Days Discharge Instructions: In clinic only. Use triamcinolone 15 (g) in clinic only. Peri-Wound Care: Sween Lotion (Moisturizing lotion) 1 x Per Week/15 Days Discharge Instructions: Apply moisturizing lotion as directed Prim Dressing: KerraCel Ag Gelling Fiber Dressing, 4x5 in (silver alginate) 1 x Per Week/15 Days ary Discharge Instructions: Apply silver alginate to wound bed as instructed Secondary Dressing: Woven Gauze Sponge, Non-Sterile 4x4 in 1  x Per Week/15  Days Discharge Instructions: Apply over primary dressing as directed. Secondary Dressing: ABD Pad, 8x10 1 x Per Week/15 Days Discharge Instructions: Apply over primary dressing as directed. Compression Wrap: ThreePress (3 layer compression wrap) 1 x Per Week/15 Days Discharge Instructions: Apply three layer compression as directed. Wound #7R - Lower Leg Wound Laterality: Left, Circumferential Cleanser: Soap and Water 1 x Per Week/15 Days Discharge Instructions: May shower and wash wound with dial antibacterial soap and water prior to dressing change. Cleanser: Wound Cleanser 1 x Per Week/15 Days Discharge Instructions: Cleanse the wound with wound cleanser prior to applying a clean dressing using gauze sponges, not tissue or cotton balls. Peri-Wound Care: Triamcinolone 15 (g) 1 x Per Week/15 Days Discharge Instructions: In clinic only. Use triamcinolone 15 (g) in clinic only. Peri-Wound Care: Sween Lotion (Moisturizing lotion) 1 x Per Week/15 Days Discharge Instructions: Apply moisturizing lotion as directed Prim Dressing: KerraCel Ag Gelling Fiber Dressing, 4x5 in (silver alginate) 1 x Per Week/15 Days ary Discharge Instructions: Apply silver alginate to wound bed as instructed Secondary Dressing: Woven Gauze Sponge, Non-Sterile 4x4 in 1 x Per Week/15 Days Discharge Instructions: Apply over primary dressing as directed. Secondary Dressing: ABD Pad, 8x10 1 x Per Week/15 Days Discharge Instructions: Apply over primary dressing as directed. Compression Wrap: ThreePress (3 layer compression wrap) 1 x Per Week/15 Days Discharge Instructions: Apply three layer compression as directed. Electronic Signature(s) Signed: 12/27/2020 2:19:45 PM By: Kalman Shan DO Entered By: Kalman Shan on 12/27/2020 14:18:16 -------------------------------------------------------------------------------- Problem List Details Patient Name: Date of Service: [name], [place] 12/27/2020 1:15 PM Medical  Record Number: [id number] Patient Account Number: [id number] Date of Birth/Sex: Treating RN: Jan 10, 1926 (87 y.o. America Brown Primary Care Provider: [name] Other Clinician: Referring Provider: Treating Provider/Extender: [name] in Treatment: 54 Active Problems ICD-10 Encounter Code Description Active Date MDM Diagnosis C44.92 Squamous cell carcinoma of skin, unspecified 06/11/2020 No Yes L97.819 Non-pressure chronic ulcer of other part of right lower leg with unspecified 06/11/2020 No Yes severity L97.829 Non-pressure chronic ulcer of other part of left lower leg with unspecified 06/11/2020 No Yes severity I87.2 Venous insufficiency (chronic) (peripheral) 06/11/2020 No Yes Inactive Problems Resolved Problems Electronic Signature(s) Signed: 12/27/2020 2:19:45 PM By: Kalman Shan DO Entered By: Kalman Shan on 12/27/2020 14:16:58 -------------------------------------------------------------------------------- Progress Note Details Patient Name: Date of Service: [name], Trudi A. 12/27/2020 1:15 PM Medical Record Number: [id number] Patient Account Number: [id number] Date of Birth/Sex: Treating RN: 06-Jan-1926 (87 y.o. 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 She states that the wound on the right has been present for a year. The 1 on the left has opened up 1 month ago. She is followed with oncology for this issue as she had biopsies that showed squamous cell carcinoma. She is also seeing radiation oncology for treatment options. She presents today because she would like for her wounds to  be healed by [place]. She currently denies signs of infection. 6/1; patient presents for 1 week follow-up. She states she has tolerated the leg wraps well. She states these do not bother her and is happy to continue with them. She is scheduled to see her oncologist today to go over treatment options for the bilateral lower extremity squamous cell carcinoma. Radiation is currently not a recommended option. Patient states she overall feels well. 6/22; patient presents for 3-week follow-up. She has tolerated the wraps well until her last wrap where she states they were uncomfortable. She attributes this to the home health nurse. She denies signs of infection. She has started her first treatment of antibody infusions for her Bilateral lower extremity squamous cell carcinoma. She has no complaints today. 7/21; patient presents for 1 month follow-up. Unfortunately she has not had good experience with her wrap changes with home health. She would like to do her own dressing changes. She continues to do her antibody infusions. She denies signs of infection. 7/28; patient presents for 1 week follow-up. At last clinic visit she was switched to daily dressing changes due to issues with the wrap and home health placing them. Unfortunately she has developed weeping to her legs bilaterally. She would like to be placed in wraps today. She would also like to follow with [place] weekly for wrap changes instead of having home health change them. She denies signs of infection. 8/4; patient presents for 1 week follow-up. She has tolerated the Kerlix/Coban wraps well. She  no longer has weeping to her legs. She took the wrap off 1 day before coming in to be able to take a shower. She has no issues or complaints today. She denies signs of infection. 8/18; patient presents for follow-up. Patient has tolerated the wraps well. She brought her Velcro compression wraps today. She has no issues or complaints today. She had her chemotherapy infusion yesterday without issues. She denies signs of infection. 8/25; patient presents for follow-up. She used her juxta lite compressions for the past week. It is unclear if she is able to put these on correctly since she states she has a hard time getting them to look right. She reports 2 open wounds. She currently denies signs of infection. 9/1; patient presents for 1 week follow-up. She has 1 open wound. She tolerated the compression wraps well. She currently denies signs of infection. 9/8; patient presents for 1 week follow-up. She has 2 open wounds 1 on each leg. She has tolerated the compression wrap well. She currently denies signs of infection. 9/15; patient presents for 1 week follow-up. She now has 3 wounds. 2 on the left and 1 on the right. She continues to tolerate the compression wrap well. She currently denies signs of infection. She has obtained furosemide by her primary care physician and would like to discuss when to take this. 9/22; patient presents for 1 week follow-up. She has scattered wounds on her lower extremities bilaterally. She did take furosemide twice in the past week. She does not recall having to urinate more frequently. She tolerated the 3 layer compression well. She denies signs of infection. 10/13; patient has not been here recently because of [place]. Apparently the facility was only putting gauze on her legs. This is a patient I do not normally see. 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 Ronnald Ramp at [facility] dermatology.  She is quite convincing that she did  not have radiation to her lower legs. It is likely she also has significant chronic venous insufficiency stasis dermatitis. We have been using silver alginate under kerlix Coban. She has obvious open areas medially on the right and areas on the left. She also has areas of extensive dry flaking adherent areas on the right and to a lesser extent on the left anterior. Nodular areas on the left lateral lower leg left posterior calf and right mid calf medially. 10/21; patient presents for follow-up. She has no issues or complaints today. She has tolerated the 3 layer compression well bilaterally. She denies signs of infection. 10/27; patient presents for follow-up. She continues to tolerate the 3 layer compression wrap well. She denies signs of infection. 11/30; patient presents for follow-up. She has no issues or complaints today. 11/10; patient arrived in clinic today for nurse visit accompanied by her daughter from [place]. The daughter had multiple questions so we turned this into doctors visit. Apparently after the last time I saw this woman in October she went to see Dr. Ronnald Ramp but he did not take the wraps off. She previously was treated with Cemiplimab for her squamous cell carcinoma. She did not receive radiation to her legs. I had wanted Dr. Ronnald Ramp to look at this because of the exceptionally damaged skin on her lower legs bilaterally. She has odd looking wounds on the left medial lower leg also extending posteriorly which we have not made a lot of progress on. But she also has a raised hyperkeratotic nodules on the right anterior tibial area plaques on the left medial lower thigh. These are not areas that are under compression. We have been using silver alginate on any open areas Liberal TCA under 3 layer compression. 11/17; patient presents for follow-up. She had her wraps taken off yesterday for her dermatology appointment. She reports excessive weeping to her  legs bilaterally after the wraps were taken off. She currently denies signs of infection. 12/12/2020 upon evaluation today patient appears to be doing about the same in regard to her wounds. She was actually started on Cipro after having biopsies apparently at her dermatology clinic she does not have the results back from the actual biopsy but the culture did return and apparently they placed her on the Cipro she started that just this morning. Other than that her legs appear to be doing okay at this time. 12/1; patient presents for follow-up. She has no issues or complaints today. She has started Lasix 40 mg daily to help with her bilateral leg swelling. 12/8; patient presents for follow-up. She has no issues or complaints today. Patient History Information obtained from Patient. Family History Unknown History. Social History Never smoker, Marital Status - Single, Alcohol Use - Never, Drug Use - No History, Caffeine Use - Never. Medical History Eyes Denies history of Cataracts, Optic Neuritis Ear/Nose/Mouth/Throat Denies history of Chronic sinus problems/congestion, Middle ear problems Hematologic/Lymphatic Patient has history of Anemia Denies history of Hemophilia, Human Immunodeficiency Virus, Lymphedema, Sickle Cell Disease Respiratory Denies history of Aspiration, Asthma, Chronic Obstructive Pulmonary Disease (COPD), Pneumothorax, Sleep Apnea, Tuberculosis Cardiovascular Patient has history of Arrhythmia - Atrial Flutter, A fibb, Congestive Heart Failure, Hypertension Denies history of Angina, Coronary Artery Disease, Deep Vein Thrombosis, Hypotension, Myocardial Infarction, Peripheral Arterial Disease, Peripheral Venous Disease, Phlebitis, Vasculitis Gastrointestinal Patient has history of Colitis Denies history of Cirrhosis , Crohnoos, Hepatitis A, Hepatitis B, Hepatitis C Endocrine Denies history of Type I Diabetes, Type II Diabetes Genitourinary Denies history of End Stage  Renal Disease Immunological Denies history of Lupus Erythematosus, Raynaudoos, Scleroderma Integumentary (Skin) Denies history of History of Burn Musculoskeletal Patient has history of Osteoarthritis Denies history of Gout, Rheumatoid Arthritis, Osteomyelitis Neurologic Denies history of Dementia, Neuropathy, Quadriplegia, Paraplegia, Seizure Disorder Oncologic Denies history of Received Chemotherapy, Received Radiation Hospitalization/Surgery History - removal of rod left leg. 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 Vitals Time Taken: 1:48 PM, Height: 65 in, Weight: 163 lbs, BMI: 27.1, Temperature: 97.4 F, Pulse: 80 bpm, Respiratory Rate: 18 breaths/min, Blood Pressure: 123/72 mmHg. Cardiovascular 2+ dorsalis pedis/posterior tibialis pulses. Psychiatric pleasant and cooperative. General Notes: Right lower extremity: Small open wound to the anterior aspect limited to skin breakdown. Left lower extremity: Open wound to the posterior aspect with granulation tissue present. Good edema control Integumentary (Hair, Skin) Wound #15 status is Open. Original cause of wound was Gradually Appeared. The date acquired was: 11/08/2020. The wound has been in treatment 7 weeks. The wound is located on the Right,Posterior Lower Leg. The wound measures 0.3cm length x 1cm width x 0.1cm depth; 0.236cm^2 area and 0.024cm^3 volume. There is Fat Layer (Subcutaneous Tissue) exposed. There is no tunneling or undermining noted. There is a medium amount of serosanguineous drainage noted. The wound margin is distinct with the outline attached to the wound base. There is large (67-100%) red, pink granulation within the wound bed. There is no necrotic tissue within the wound bed. Wound  #7R status is Open. Original cause of wound was Blister. The date acquired was: 04/20/2020. The wound has been in treatment 28 weeks. The wound is located on the Left,Circumferential Lower Leg. The wound measures 5.5cm length x 6cm width x 0.1cm depth; 25.918cm^2 area and 2.592cm^3 volume. There is Fat Layer (Subcutaneous Tissue) exposed. There is no tunneling or undermining noted. There is a medium amount of serosanguineous drainage noted. The wound margin is distinct with the outline attached to the wound base. There is large (67-100%) red granulation within the wound bed. There is no necrotic tissue within the wound bed. 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 No signs of infection. I recommended continuing silver alginate under Kerlix/Coban. Procedures Wound #15 Pre-procedure diagnosis of Wound #15 is a Venous Leg Ulcer located on the Right,Posterior Lower Leg . There was a Three Layer Compression Therapy Procedure by Dellie Catholic, RN. Post procedure Diagnosis Wound #15: Same as Pre-Procedure Wound #7R Pre-procedure diagnosis of Wound #7R is a Malignant Wound located on the Left,Circumferential Lower Leg . There was a Three Layer Compression Therapy Procedure by Dellie Catholic, RN. Post procedure Diagnosis Wound #7R: Same as Pre-Procedure Plan Follow-up Appointments: Return Appointment in 1 week. - with Dr. Heber Mauston Bathing/ Shower/ Hygiene: May shower with protection but do not get wound dressing(s) wet. - Use cast protector Edema Control - Lymphedema / SCD / Other: Elevate legs to the level of the heart or above for 30 minutes daily and/or when  sitting, a frequency of: - elevate the legs throughout the day heart level if possible. Avoid standing for long periods of time. Exercise regularly Additional Orders / Instructions: Follow Nutritious Diet WOUND #15: -  Lower Leg Wound Laterality: Right, Posterior Cleanser: Soap and Water 1 x Per Week/15 Days Discharge Instructions: May shower and wash wound with dial antibacterial soap and water prior to dressing change. Cleanser: Wound Cleanser 1 x Per Week/15 Days Discharge Instructions: Cleanse the wound with wound cleanser prior to applying a clean dressing using gauze sponges, not tissue or cotton balls. Peri-Wound Care: Triamcinolone 15 (g) 1 x Per Week/15 Days Discharge Instructions: In clinic only. Use triamcinolone 15 (g) in clinic only. Peri-Wound Care: Sween Lotion (Moisturizing lotion) 1 x Per Week/15 Days Discharge Instructions: Apply moisturizing lotion as directed Prim Dressing: KerraCel Ag Gelling Fiber Dressing, 4x5 in (silver alginate) 1 x Per Week/15 Days ary Discharge Instructions: Apply silver alginate to wound bed as instructed Secondary Dressing: Woven Gauze Sponge, Non-Sterile 4x4 in 1 x Per Week/15 Days Discharge Instructions: Apply over primary dressing as directed. Secondary Dressing: ABD Pad, 8x10 1 x Per Week/15 Days Discharge Instructions: Apply over primary dressing as directed. Com pression Wrap: ThreePress (3 layer compression wrap) 1 x Per Week/15 Days Discharge Instructions: Apply three layer compression as directed. WOUND #7R: - Lower Leg Wound Laterality: Left, Circumferential Cleanser: Soap and Water 1 x Per Week/15 Days Discharge Instructions: May shower and wash wound with dial antibacterial soap and water prior to dressing change. Cleanser: Wound Cleanser 1 x Per Week/15 Days Discharge Instructions: Cleanse the wound with wound cleanser prior to applying a clean dressing using gauze sponges, not tissue or cotton balls. Peri-Wound Care:  Triamcinolone 15 (g) 1 x Per Week/15 Days Discharge Instructions: In clinic only. Use triamcinolone 15 (g) in clinic only. Peri-Wound Care: Sween Lotion (Moisturizing lotion) 1 x Per Week/15 Days Discharge Instructions: Apply moisturizing lotion as directed Prim Dressing: KerraCel Ag Gelling Fiber Dressing, 4x5 in (silver alginate) 1 x Per Week/15 Days ary Discharge Instructions: Apply silver alginate to wound bed as instructed Secondary Dressing: Woven Gauze Sponge, Non-Sterile 4x4 in 1 x Per Week/15 Days Discharge Instructions: Apply over primary dressing as directed. Secondary Dressing: ABD Pad, 8x10 1 x Per Week/15 Days Discharge Instructions: Apply over primary dressing as directed. Com pression Wrap: ThreePress (3 layer compression wrap) 1 x Per Week/15 Days Discharge Instructions: Apply three layer compression as directed. 1. Silver alginate under Kerlix/Coban 2. Follow-up in 1 week Electronic Signature(s) Signed: 12/27/2020 2:19:45 PM By: Kalman Shan DO Entered By: Kalman Shan on 12/27/2020 14:18:53 -------------------------------------------------------------------------------- HxROS Details Patient Name: Date of Service: [name], [place]. 12/27/2020 1:15 PM Medical Record Number: [id number] Patient Account Number: [id number] Date of Birth/Sex: Treating RN: 1925-02-27 (87 y.o. F) Primary Care Provider: [name] Other Clinician: Referring Provider: Treating Provider/Extender: [name] in Treatment: 28 Information Obtained From Patient Eyes Medical History: Negative for: Cataracts; Optic Neuritis Ear/Nose/Mouth/Throat Medical History: Negative for: Chronic sinus problems/congestion; Middle ear problems Hematologic/Lymphatic Medical History: Positive for: Anemia Negative for: Hemophilia; Human Immunodeficiency Virus; Lymphedema; Sickle Cell Disease Respiratory Medical History: Negative for: Aspiration; Asthma;  Chronic Obstructive Pulmonary Disease (COPD); Pneumothorax; Sleep Apnea; Tuberculosis Cardiovascular Medical History: Positive for: Arrhythmia - Atrial Flutter, A fibb; Congestive Heart Failure; Hypertension Negative for: Angina; Coronary Artery Disease; Deep Vein Thrombosis; Hypotension; Myocardial Infarction; Peripheral Arterial Disease; Peripheral Venous Disease; Phlebitis; Vasculitis Past Medical History Notes: hyperlipidemia Gastrointestinal Medical History: Positive for: Colitis Negative for: Cirrhosis ; Crohns; Hepatitis A; Hepatitis B; Hepatitis C Endocrine Medical History: Negative for: Type I Diabetes; Type II Diabetes Past Medical History Notes: hypothyroidism Genitourinary Medical History: Negative for: End Stage Renal Disease  Immunological Medical History: Negative for: Lupus Erythematosus; Raynauds; Scleroderma Integumentary (Skin) Medical History: Negative for: History of Burn Musculoskeletal Medical History: Positive for: Osteoarthritis Negative for: Gout; Rheumatoid Arthritis; Osteomyelitis Neurologic Medical History: Negative for: Dementia; Neuropathy; Quadriplegia; Paraplegia; Seizure Disorder Past Medical History Notes: lumbar spindylolysis Oncologic Medical History: Negative for: Received Chemotherapy; Received Radiation Past Medical History Notes: BLE squamous ceel carcionoma Immunizations Pneumococcal Vaccine: Received Pneumococcal Vaccination: Yes Received Pneumococcal Vaccination On or After 60th Birthday: No Implantable Devices None Hospitalization / Surgery History Type of Hospitalization/Surgery removal of rod left leg Family and Social History Unknown History: Yes; Never smoker; Marital Status - Single; Alcohol Use: Never; Drug Use: No History; Caffeine Use: Never; Financial Concerns: No; Food, Clothing or Shelter Needs: No; Support System Lacking: No; Transportation Concerns: No Electronic Signature(s) Signed: 12/27/2020 2:19:45 PM  By: Kalman Shan DO Entered By: Kalman Shan on 12/27/2020 14:17:39 -------------------------------------------------------------------------------- SuperBill Details Patient Name: Date of Service: [name], [place]. 12/27/2020 Medical Record Number: [id number] Patient Account Number: [id number] Date of Birth/Sex: Treating RN: 1925/10/11 (87 y.o. [place] Brown Primary Care Provider: [name] Other Clinician: Referring Provider: Treating Provider/Extender: [name] in Treatment: 28 Diagnosis Coding ICD-10 Codes Code Description C44.92 Squamous cell carcinoma of skin, unspecified L97.819 Non-pressure chronic ulcer of other part of right lower leg with unspecified severity L97.829 Non-pressure chronic ulcer of other part of left lower leg with unspecified severity I87.2 Venous insufficiency (chronic) (peripheral) Facility Procedures CPT4: Code [id number] Description: 81 BILATERAL: Application of multi-layer venous compression system; leg (below knee), including ankle and t. ICD-10 Diagnosis Description L97.819 Non-pressure chronic ulcer of other part of right lower leg with unspecified severity L97.829  Non-pressure chronic ulcer of other part of left lower leg with unspecified severity Modifier: Quantity: 1 Physician Procedures : CPT4 Code Description Modifier [id number] - WC PHYS LEVEL 3 - EST PT ICD-10 Diagnosis Description L97.819 Non-pressure chronic ulcer of other part of right lower leg with unspecified severity L97.829 Non-pressure chronic ulcer of other part of left  lower leg with unspecified severity C44.92 Squamous cell carcinoma of skin, unspecified I87.2 Venous insufficiency (chronic) (peripheral) Quantity: 1 Electronic Signature(s) Signed: 12/27/2020 2:19:45 PM By: Kalman Shan DO Entered By: Kalman Shan on 12/27/2020 14:19:21

## 2021-01-02 NOTE — Telephone Encounter (Signed)
LVM and sent [facility] to schedule.

## 2021-01-03 ENCOUNTER — Encounter (HOSPITAL_BASED_OUTPATIENT_CLINIC_OR_DEPARTMENT_OTHER): Payer: Medicare Other | Admitting: Internal Medicine

## 2021-01-03 ENCOUNTER — Other Ambulatory Visit: Payer: Self-pay

## 2021-01-03 ENCOUNTER — Other Ambulatory Visit: Payer: Self-pay | Admitting: Cardiovascular Disease

## 2021-01-03 ENCOUNTER — Telehealth: Payer: Self-pay | Admitting: Internal Medicine

## 2021-01-03 DIAGNOSIS — I872 Venous insufficiency (chronic) (peripheral): Secondary | ICD-10-CM

## 2021-01-03 DIAGNOSIS — L97819 Non-pressure chronic ulcer of other part of right lower leg with unspecified severity: Secondary | ICD-10-CM

## 2021-01-03 DIAGNOSIS — I509 Heart failure, unspecified: Secondary | ICD-10-CM | POA: Diagnosis not present

## 2021-01-03 DIAGNOSIS — I110 Hypertensive heart disease with heart failure: Secondary | ICD-10-CM | POA: Diagnosis not present

## 2021-01-03 DIAGNOSIS — C4492 Squamous cell carcinoma of skin, unspecified: Secondary | ICD-10-CM | POA: Diagnosis not present

## 2021-01-03 DIAGNOSIS — L97829 Non-pressure chronic ulcer of other part of left lower leg with unspecified severity: Secondary | ICD-10-CM

## 2021-01-03 DIAGNOSIS — L97822 Non-pressure chronic ulcer of other part of left lower leg with fat layer exposed: Secondary | ICD-10-CM | POA: Diagnosis not present

## 2021-01-03 DIAGNOSIS — L97812 Non-pressure chronic ulcer of other part of right lower leg with fat layer exposed: Secondary | ICD-10-CM | POA: Diagnosis not present

## 2021-01-03 NOTE — Progress Notes (Signed)
[name] ([id number]) , Visit Report for 01/03/2021 Arrival Information Details Patient Name: Date of Service: [name], [name]. 01/03/2021 1:15 PM Medical Record Number: [id number] Patient Account Number: [id number] Date of Birth/Sex: Treating RN: [birth year] (87 y.o. [name] Primary Care Leopoldo Mazzie: [name] Other Clinician: Referring Lataria Courser: Treating Jamespaul Secrist/Extender: [name] in Treatment: 29 Visit Information History Since Last Visit Added or deleted any medications: No Patient Arrived: Walker Any new allergies or adverse reactions: No Arrival Time: 13:05 Had a fall or experienced change in No Transfer Assistance: None activities of daily living that may affect Patient Identification Verified: Yes risk of falls: Secondary Verification Process Completed: Yes Signs or symptoms of abuse/neglect since last visito No Patient Requires Transmission-Based No Hospitalized since last visit: No Precautions: Implantable device outside of the clinic excluding No Patient Has Alerts: Yes cellular tissue based products placed in the center Patient Alerts: R ABI = Non compressible since last visit: L ABI = Non compressible Has Dressing in Place as Prescribed: Yes Has Compression in Place as Prescribed: Yes Pain Present Now: No Electronic Signature(s) Signed: 01/03/2021 5:42:26 PM By: Lorrin Jackson Entered By: Lorrin Jackson on 01/03/2021 13:06:11 -------------------------------------------------------------------------------- Compression Therapy Details Patient Name: Date of Service: [name], [name] 01/03/2021 1:15 PM Medical Record Number: [id number] Patient Account Number: [id number] Date of Birth/Sex: Treating RN: [birth year] (87 y.o. [name] Primary Care Deniro Laymon: [name] Other Clinician: Referring Synia Douglass: Treating Sircharles Holzheimer/Extender: [name] in  Treatment: 29 Compression Therapy Performed for Wound Assessment: Wound #15 Right,Posterior Lower Leg Performed By: Clinician Lorrin Jackson, RN Compression Type: Three Layer Post Procedure Diagnosis Same as Pre-procedure Electronic Signature(s) Signed: 01/03/2021 5:42:26 PM By: Lorrin Jackson Entered By: Lorrin Jackson on 01/03/2021 13:50:40 -------------------------------------------------------------------------------- Compression Therapy Details Patient Name: Date of Service: [name], [name] 01/03/2021 1:15 PM Medical Record Number: [id number] Patient Account Number: [id number] Date of Birth/Sex: Treating RN: May 20, 1925 (87 y.o. [name] Primary Care Siena Poehler: [name] Other Clinician: Referring Sukhman Kocher: Treating Jakob Kimberlin/Extender: [name] in Treatment: 29 Compression Therapy Performed for Wound Assessment: Wound #7R Left,Circumferential Lower Leg Performed By: Clinician Lorrin Jackson, RN Compression Type: Three Layer Post Procedure Diagnosis Same as Pre-procedure Electronic Signature(s) Signed: 01/03/2021 5:42:26 PM By: Lorrin Jackson Entered By: Lorrin Jackson on 01/03/2021 13:50:40 -------------------------------------------------------------------------------- Encounter Discharge Information Details Patient Name: Date of Service: [name], [name] 01/03/2021 1:15 PM Medical Record Number: [id number] Patient Account Number: [id number] Date of Birth/Sex: Treating RN: 10-07-1925 (87 y.o. [name] Primary Care Nadie Fiumara: [name] Other Clinician: Referring Assata Juncaj: Treating Romesha Scherer/Extender: [name] in Treatment: 70 Encounter Discharge Information Items Discharge Condition: Stable Ambulatory Status: Walker Discharge Destination: Home Transportation: Other Schedule Follow-up Appointment: Yes Clinical Summary of Care: Provided on 01/03/2021 Form  Type Recipient Paper Patient Patient Electronic Signature(s) Signed: 01/03/2021 5:42:26 PM By: Lorrin Jackson Entered By: Lorrin Jackson on 01/03/2021 14:18:03 -------------------------------------------------------------------------------- Lower Extremity Assessment Details Patient Name: Date of Service: [name], [name] 01/03/2021 1:15 PM Medical Record Number: [id number] Patient Account Number: [id number] Date of Birth/Sex: Treating RN: 04-Apr-1925 (87 y.o. [name] Primary Care Ondre Salvetti: [name] Other Clinician: Referring Azhar Knope: Treating Collene Massimino/Extender: [name] in Treatment: 29 Edema Assessment Assessed: [Left: No] [Right: No] Edema: [Left: Yes] [Right: Yes] Calf Left: Right: Point of Measurement: 30 cm From Medial Instep 32.6 cm 31.3 cm Ankle Left: Right: Point  of Measurement: 9 cm From Medial Instep 23.8 cm 21.7 cm Electronic Signature(s) Signed: 01/03/2021 5:35:09 PM By: Dellie Catholic RN Signed: 01/03/2021 5:42:26 PM By: Lorrin Jackson Entered By: Dellie Catholic on 01/03/2021 13:41:50 -------------------------------------------------------------------------------- Multi Wound Chart Details Patient Name: Date of Service: [name], [name] 01/03/2021 1:15 PM Medical Record Number: [id number] Patient Account Number: [id number] Date of Birth/Sex: Treating RN: [birth year] (87 y.o. F) Primary Care Kacy Hegna: [name] Other Clinician: Referring Shelbia Scinto: Treating Sebastin Perlmutter/Extender: [name] in Treatment: 63 Vital Signs Height(in): 65 Pulse(bpm): 87 Weight(lbs): 163 Blood Pressure(mmHg): 130/75 Body Mass Index(BMI): 27 Temperature(F): 98.5 Respiratory Rate(breaths/min): 18 Photos: [15:No Photos Right, Posterior Lower Leg] [7R:No Photos Left, Circumferential Lower Leg] [N/A:N/A N/A] Wound Location: [15:Gradually Appeared] [7R:Blister] [N/A:N/A] Wounding  Event: [15:Venous Leg Ulcer] [7R:Malignant Wound] [N/A:N/A] Primary Etiology: [15:Anemia, Arrhythmia, Congestive Heart Anemia, Arrhythmia, Congestive Heart N/A] Comorbid History: [15:Failure, Hypertension, Colitis, Osteoarthritis 11/08/2020] [7R:Failure, Hypertension, Colitis, Osteoarthritis 04/20/2020] [N/A:N/A] Date Acquired: [15:8] [7R:29] [N/A:N/A] Weeks of Treatment: [15:Open] [7R:Open] [N/A:N/A] Wound Status: [15:No] [7R:Yes] [N/A:N/A] Wound Recurrence: [15:Yes] [7R:No] [N/A:N/A] Clustered Wound: [15:7] [7R:N/A] [N/A:N/A] Clustered Quantity: [15:2.3x1.6x0.1] [7R:6.5x5.5x0.1] [N/A:N/A] Measurements L x W x D (cm) [15:2.89] [0X:73.532] [N/A:N/A] A (cm) : rea [15:0.289] [7R:2.808] [N/A:N/A] Volume (cm) : [15:26.40%] [7R:-814.30%] [N/A:N/A] % Reduction in Area: [15:26.50%] [7R:-814.70%] [N/A:N/A] % Reduction in Volume: [15:Full Thickness Without Exposed] [7R:Full Thickness Without Exposed] [N/A:N/A] Classification: [15:Support Structures Medium] [7R:Support Structures Medium] [N/A:N/A] Exudate Amount: [15:Serosanguineous] [7R:Serosanguineous] [N/A:N/A] Exudate Type: [15:red, brown] [7R:red, brown] [N/A:N/A] Exudate Color: [15:Distinct, outline attached] [7R:Distinct, outline attached] [N/A:N/A] Wound Margin: [15:Large (67-100%)] [7R:Large (67-100%)] [N/A:N/A] Granulation Amount: [15:Pink] [7R:Red] [N/A:N/A] Granulation Quality: [15:None Present (0%)] [7R:None Present (0%)] [N/A:N/A] Necrotic Amount: [15:Fat Layer (Subcutaneous Tissue): Yes Fat Layer (Subcutaneous Tissue): Yes N/A] Exposed Structures: [15:Fascia: No Tendon: No Muscle: No Joint: No Bone: No Medium (34-66%)] [7R:Fascia: No Tendon: No Muscle: No Joint: No Bone: No Medium (34-66%)] [N/A:N/A] Epithelialization: [15:Compression Therapy] [7R:Compression Therapy] [N/A:N/A] Treatment Notes Electronic Signature(s) Signed: 01/03/2021 2:10:00 PM By: Kalman Shan DO Entered By: Kalman Shan on 01/03/2021  13:57:31 -------------------------------------------------------------------------------- Multi-Disciplinary Care Plan Details Patient Name: Date of Service: [name], Marsella A. 01/03/2021 1:15 PM Medical Record Number: [id number] Patient Account Number: [id number] Date of Birth/Sex: Treating RN: Mar 15, 1925 (87 y.o. [name] Primary Care Kynsleigh Westendorf: [name] Other Clinician: Referring Damisha Wolff: Treating Draper Gallon/Extender: [name] in Treatment: 74 Multidisciplinary Care Plan reviewed with physician Active Inactive Venous Leg Ulcer Nursing Diagnoses: Knowledge deficit related to disease process and management Potential for venous Insuffiency (use before diagnosis confirmed) Goals: Patient will maintain optimal edema control Date Initiated: 11/01/2020 Target Resolution Date: 01/24/2021 Goal Status: Active Interventions: Assess peripheral edema status every visit. Compression as ordered Provide education on venous insufficiency Treatment Activities: Therapeutic compression applied : 11/01/2020 Notes: 12/27/20: Edema control ongoing Wound/Skin Impairment Nursing Diagnoses: Impaired tissue integrity Knowledge deficit related to ulceration/compromised skin integrity Goals: Patient/caregiver will verbalize understanding of skin care regimen Date Initiated: 06/11/2020 Target Resolution Date: 01/24/2021 Goal Status: Active Interventions: Assess patient/caregiver ability to obtain necessary supplies Assess patient/caregiver ability to perform ulcer/skin care regimen upon admission and as needed Assess ulceration(s) every visit Provide education on ulcer and skin care Notes: 10/11/20: Wound care regimen ongoing. 12/27/20: Wound care continues Electronic Signature(s) Signed: 01/03/2021 5:42:26 PM By: Lorrin Jackson Entered By: Lorrin Jackson on 01/03/2021  13:05:07 -------------------------------------------------------------------------------- Pain Assessment Details Patient Name: Date of Service: [name], [place]. 01/03/2021 1:15 PM Medical Record Number: [id number] Patient Account Number: [id number]  Date of Birth/Sex: Treating RN: 1925-02-13 (87 y.o. [name] Primary Care Alexander Mcauley: [name] Other Clinician: Referring Kamilah Correia: Treating Ahmia Colford/Extender: [name] in Treatment: 29 Active Problems Location of Pain Severity and Description of Pain Patient Has Paino No Site Locations Pain Management and Medication Current Pain Management: Electronic Signature(s) Signed: 01/03/2021 5:42:26 PM By: Lorrin Jackson Entered By: Lorrin Jackson on 01/03/2021 13:08:34 -------------------------------------------------------------------------------- Patient/Caregiver Education Details Patient Name: Date of Service: [name], [place]. 12/15/2022andnbsp1:15 PM Medical Record Number: [id number] Patient Account Number: [id number] Date of Birth/Gender: Treating RN: Feb 04, 1925 (87 y.o. [name] Primary Care Physician: [name] Other Clinician: Referring Physician: Treating Physician/Extender: [name] in Treatment: 29 Education Assessment Education Provided To: Patient and Caregiver Education Topics Provided Venous: Methods: Explain/Verbal, Printed Responses: State content correctly Wound/Skin Impairment: Methods: Explain/Verbal, Printed Responses: State content correctly Electronic Signature(s) Signed: 01/03/2021 5:42:26 PM By: Lorrin Jackson Entered By: Lorrin Jackson on 01/03/2021 13:05:38 -------------------------------------------------------------------------------- Wound Assessment Details Patient Name: Date of Service: [name], [place] 01/03/2021 1:15 PM Medical Record Number: [id number] Patient Account Number:  [id number] Date of Birth/Sex: Treating RN: 09-Dec-1925 (87 y.o. [name] Primary Care Madilynn Montante: [name] Other Clinician: Referring Thu Baggett: Treating Aadyn Buchheit/Extender: [name] in Treatment: 29 Wound Status Wound Number: 15 Primary Venous Leg Ulcer Etiology: Wound Location: Right, Posterior Lower Leg Wound Open Wounding Event: Gradually Appeared Status: Date Acquired: 11/08/2020 Comorbid Anemia, Arrhythmia, Congestive Heart Failure, Hypertension, Weeks Of Treatment: 8 History: Colitis, Osteoarthritis Clustered Wound: Yes Photos Photo Uploaded By: Donavan Burnet on 01/03/2021 15:55:07 Wound Measurements Length: (cm) 2.3 Width: (cm) 1.6 Depth: (cm) 0.1 Clustered Quantity: 7 Area: (cm) 2.89 Volume: (cm) 0.289 % Reduction in Area: 26.4% % Reduction in Volume: 26.5% Epithelialization: Medium (34-66%) Tunneling: No Undermining: No Wound Description Classification: Full Thickness Without Exposed Support Structures Wound Margin: Distinct, outline attached Exudate Amount: Medium Exudate Type: Serosanguineous Exudate Color: red, brown Foul Odor After Cleansing: No Slough/Fibrino No Wound Bed Granulation Amount: Large (67-100%) Exposed Structure Granulation Quality: Pink Fascia Exposed: No Necrotic Amount: None Present (0%) Fat Layer (Subcutaneous Tissue) Exposed: Yes Tendon Exposed: No Muscle Exposed: No Joint Exposed: No Bone Exposed: No Treatment Notes Wound #15 (Lower Leg) Wound Laterality: Right, Posterior Cleanser Soap and Water Discharge Instruction: May shower and wash wound with dial antibacterial soap and water prior to dressing change. Wound Cleanser Discharge Instruction: Cleanse the wound with wound cleanser prior to applying a clean dressing using gauze sponges, not tissue or cotton balls. Peri-Wound Care Triamcinolone 15 (g) Discharge Instruction: In clinic only. Use triamcinolone 15 (g) in  clinic only. Sween Lotion (Moisturizing lotion) Discharge Instruction: Apply moisturizing lotion as directed Topical Primary Dressing KerraCel Ag Gelling Fiber Dressing, 4x5 in (silver alginate) Discharge Instruction: Apply silver alginate to wound bed as instructed Secondary Dressing Woven Gauze Sponge, Non-Sterile 4x4 in Discharge Instruction: Apply over primary dressing as directed. ABD Pad, 8x10 Discharge Instruction: Apply over primary dressing as directed. Secured With Compression Wrap ThreePress (3 layer compression wrap) Discharge Instruction: Apply three layer compression as directed. Compression Stockings Add-Ons Electronic Signature(s) Signed: 01/03/2021 5:42:26 PM By: Lorrin Jackson Entered By: Lorrin Jackson on 01/03/2021 13:38:11 -------------------------------------------------------------------------------- Wound Assessment Details Patient Name: Date of Service: [name], [place] 01/03/2021 1:15 PM Medical Record Number: [id number] Patient Account Number: [id number] Date of Birth/Sex: Treating RN: Jan 27, 1925 (87 y.o. [name] Primary Care Jalacia Mattila: [name] Other Clinician: Referring Navon Kotowski: Treating Beatriz Settles/Extender:  Hoffman, Jessica [name], Aleksei V Weeks in Treatment: 29 Wound Status Wound Number: 7R Primary Malignant Wound Etiology: Wound Location: Left, Circumferential Lower Leg Wound Open Wounding Event: Blister Status: Date Acquired: 04/20/2020 Comorbid Anemia, Arrhythmia, Congestive Heart Failure, Hypertension, Weeks Of Treatment: 29 History: Colitis, Osteoarthritis Clustered Wound: No Photos Photo Uploaded By: Donavan Burnet on 01/03/2021 15:54:13 Wound Measurements Length: (cm) 6.5 Width: (cm) 5.5 Depth: (cm) 0.1 Area: (cm) 28.078 Volume: (cm) 2.808 % Reduction in Area: -814.3% % Reduction in Volume: -814.7% Epithelialization: Medium (34-66%) Tunneling: No Undermining: No Wound  Description Classification: Full Thickness Without Exposed Support Structures Wound Margin: Distinct, outline attached Exudate Amount: Medium Exudate Type: Serosanguineous Exudate Color: red, brown Foul Odor After Cleansing: No Slough/Fibrino No Wound Bed Granulation Amount: Large (67-100%) Exposed Structure Granulation Quality: Red Fascia Exposed: No Necrotic Amount: None Present (0%) Fat Layer (Subcutaneous Tissue) Exposed: Yes Tendon Exposed: No Muscle Exposed: No Joint Exposed: No Bone Exposed: No Treatment Notes Wound #7R (Lower Leg) Wound Laterality: Left, Circumferential Cleanser Soap and Water Discharge Instruction: May shower and wash wound with dial antibacterial soap and water prior to dressing change. Wound Cleanser Discharge Instruction: Cleanse the wound with wound cleanser prior to applying a clean dressing using gauze sponges, not tissue or cotton balls. Peri-Wound Care Triamcinolone 15 (g) Discharge Instruction: In clinic only. Use triamcinolone 15 (g) in clinic only. Sween Lotion (Moisturizing lotion) Discharge Instruction: Apply moisturizing lotion as directed Topical Primary Dressing KerraCel Ag Gelling Fiber Dressing, 4x5 in (silver alginate) Discharge Instruction: Apply silver alginate to wound bed as instructed Secondary Dressing Woven Gauze Sponge, Non-Sterile 4x4 in Discharge Instruction: Apply over primary dressing as directed. ABD Pad, 8x10 Discharge Instruction: Apply over primary dressing as directed. Secured With Compression Wrap ThreePress (3 layer compression wrap) Discharge Instruction: Apply three layer compression as directed. Compression Stockings Add-Ons Electronic Signature(s) Signed: 01/03/2021 5:42:26 PM By: Lorrin Jackson Entered By: Lorrin Jackson on 01/03/2021 13:40:39 -------------------------------------------------------------------------------- Vitals Details Patient Name: Date of Service: [name], [place] 01/03/2021  1:15 PM Medical Record Number: [id number] Patient Account Number: [id number] Date of Birth/Sex: Treating RN: [birth year] (87 y.o. [name] Primary Care Odessa Nishi: [name] Other Clinician: Referring Abayomi Pattison: Treating Brenda Cowher/Extender: [name] in Treatment: 29 Vital Signs Time Taken: 13:06 Temperature (F): 98.5 Height (in): 65 Pulse (bpm): 87 Weight (lbs): 163 Respiratory Rate (breaths/min): 18 Body Mass Index (BMI): 27.1 Blood Pressure (mmHg): 130/75 Reference Range: 80 - 120 mg / dl Electronic Signature(s) Signed: 01/03/2021 5:35:09 PM By: Dellie Catholic RN Entered By: Dellie Catholic on 01/03/2021 13:49:04

## 2021-01-03 NOTE — Progress Notes (Signed)
[name] ([id number]) , Visit Report for 01/03/2021 Chief Complaint Document Details Patient Name: Date of Service: [name], [name] 01/03/2021 1:15 PM Medical Record Number: [id number] Patient Account Number: [id number] Date of Birth/Sex: Treating RN: 1925-05-04 (87 y.o. F) Primary Care Provider: [name] Other Clinician: Referring Provider: Treating Provider/Extender: [name] in Treatment: 58 Information Obtained from: Patient Chief Complaint Bilateral lower extremity wounds that have been biopsied and positive for squamous cell carcinoma Electronic Signature(s) Signed: 01/03/2021 2:10:00 PM By: Kalman Shan DO Entered By: Kalman Shan on 01/03/2021 13:57:43 -------------------------------------------------------------------------------- HPI Details Patient Name: Date of Service: [name], [name] 01/03/2021 1:15 PM Medical Record Number: [id number] Patient Account Number: [id number] Date of Birth/Sex: Treating RN: 10/03/1925 (87 y.o. F) Primary Care Provider: [name] Other Clinician: Referring Provider: Treating Provider/Extender: [name] in Treatment: 29 History of Present Illness Location: left leg HPI Description: Admission 5/23 Ms. 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 She states that the wound on the right has been present for a year. The 1 on the left has opened up 1 month ago. She is followed with oncology for this issue as she had biopsies that showed squamous cell carcinoma. She is also seeing radiation oncology for treatment options. She presents today because she would like for her wounds to be healed by [place]. She currently  denies signs of infection. 6/1; patient presents for 1 week follow-up. She states she has tolerated the leg wraps well. She states these do not bother her and is happy to continue with them. She is scheduled to see her oncologist today to go over treatment options for the bilateral lower extremity squamous cell carcinoma. Radiation is currently not a recommended option. Patient states she overall feels well. 6/22; patient presents for 3-week follow-up. She has tolerated the wraps well until her last wrap where she states they were uncomfortable. She attributes this to the home health nurse. She denies signs of infection. She has started her first treatment of antibody infusions for her Bilateral lower extremity squamous cell carcinoma. She has no complaints today. 7/21; patient presents for 1 month follow-up. Unfortunately she has not had good experience with her wrap changes with home health. She would like to do her own dressing changes. She continues to do her antibody infusions. She denies signs of infection. 7/28; patient presents for 1 week follow-up. At last clinic visit she was switched to daily dressing changes due to issues with the wrap and home health placing them. Unfortunately she has developed weeping to her legs bilaterally. She would like to be placed in wraps today. She would also like to follow with [place] weekly for wrap changes instead of having home health change them. She denies signs of infection. 8/4; patient presents for 1 week follow-up. She has tolerated the Kerlix/Coban wraps well. She no longer has weeping to her legs. She took the wrap off 1 day before coming in to be able to take a shower. She has no issues or complaints today. She denies signs of infection. 8/18; patient presents for follow-up. Patient has tolerated the wraps well. She brought her Velcro compression wraps today. She has no issues or complaints today. She had her chemotherapy infusion yesterday without  issues. She  denies signs of infection. 8/25; patient presents for follow-up. She used her juxta lite compressions for the past week. It is unclear if she is able to put these on correctly since she states she has a hard time getting them to look right. She reports 2 open wounds. She currently denies signs of infection. 9/1; patient presents for 1 week follow-up. She has 1 open wound. She tolerated the compression wraps well. She currently denies signs of infection. 9/8; patient presents for 1 week follow-up. She has 2 open wounds 1 on each leg. She has tolerated the compression wrap well. She currently denies signs of infection. 9/15; patient presents for 1 week follow-up. She now has 3 wounds. 2 on the left and 1 on the right. She continues to tolerate the compression wrap well. She currently denies signs of infection. She has obtained furosemide by her primary care physician and would like to discuss when to take this. 9/22; patient presents for 1 week follow-up. She has scattered wounds on her lower extremities bilaterally. She did take furosemide twice in the past week. She does not recall having to urinate more frequently. She tolerated the 3 layer compression well. She denies signs of infection. 10/13; patient has not been here recently because of [place]. Apparently the facility was only putting gauze on her legs. This is a patient I do not normally see. 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 Ronnald Ramp at [facility] dermatology. She is quite convincing that she did not have radiation to her lower legs. It is likely she also has significant chronic venous insufficiency stasis dermatitis. We have been using silver alginate under kerlix Coban. She has obvious open areas medially on the right and areas on the left. She also has areas of extensive dry flaking adherent areas on the right and to a lesser extent on the left  anterior. Nodular areas on the left lateral lower leg left posterior calf and right mid calf medially. 10/21; patient presents for follow-up. She has no issues or complaints today. She has tolerated the 3 layer compression well bilaterally. She denies signs of infection. 10/27; patient presents for follow-up. She continues to tolerate the 3 layer compression wrap well. She denies signs of infection. 11/30; patient presents for follow-up. She has no issues or complaints today. 11/10; patient arrived in clinic today for nurse visit accompanied by her daughter from [place]. The daughter had multiple questions so we turned this into doctors visit. Apparently after the last time I saw this woman in October she went to see Dr. Ronnald Ramp but he did not take the wraps off. She previously was treated with Cemiplimab for her squamous cell carcinoma. She did not receive radiation to her legs. I had wanted Dr. Ronnald Ramp to look at this because of the exceptionally damaged skin on her lower legs bilaterally. She has odd looking wounds on the left medial lower leg also extending posteriorly which we have not made a lot of progress on. But she also has a raised hyperkeratotic nodules on the right anterior tibial area plaques on the left medial lower thigh. These are not areas that are under compression. We have been using silver alginate on any open areas Liberal TCA under 3 layer compression. 11/17; patient presents for follow-up. She had her wraps taken off yesterday for her dermatology appointment. She reports excessive weeping to her legs bilaterally after the wraps were taken off. She currently  denies signs of infection. 12/12/2020 upon evaluation today patient appears to be doing about the same in regard to her wounds. She was actually started on Cipro after having biopsies apparently at her dermatology clinic she does not have the results back from the actual biopsy but the culture did return and apparently they placed  her on the Cipro she started that just this morning. Other than that her legs appear to be doing okay at this time. 12/1; patient presents for follow-up. She has no issues or complaints today. She has started Lasix 40 mg daily to help with her bilateral leg swelling. 12/8; patient presents for follow-up. She has no issues or complaints today. 12/15; patient presents for 1 week follow-up. She has no issues or complaints today. Electronic Signature(s) Signed: 01/03/2021 2:10:00 PM By: Kalman Shan DO Entered By: Kalman Shan on 01/03/2021 13:58:06 -------------------------------------------------------------------------------- Physical Exam Details Patient Name: Date of Service: [name], [place] 01/03/2021 1:15 PM Medical Record Number: [id number] Patient Account Number: [id number] Date of Birth/Sex: Treating RN: 01/31/1925 (87 y.o. F) Primary Care Provider: [name] Other Clinician: Referring Provider: Treating Provider/Extender: [name] in Treatment: 16 Constitutional respirations regular, non-labored and within target range for patient.. Cardiovascular 2+ dorsalis pedis/posterior tibialis pulses. Psychiatric pleasant and cooperative. Notes Right lower extremity: Small open wound limited to skin breakdown. Left lower extremity: Scattered small wounds to the posterior aspect limited to skin breakdown. Good edema control bilaterally Electronic Signature(s) Signed: 01/03/2021 2:10:00 PM By: Kalman Shan DO Entered By: Kalman Shan on 01/03/2021 13:59:20 -------------------------------------------------------------------------------- Physician Orders Details Patient Name: Date of Service: [name], [place] 01/03/2021 1:15 PM Medical Record Number: [id number] Patient Account Number: [id number] Date of Birth/Sex: Treating RN: 16-Mar-1925 (87 y.o. [name] Primary Care Provider: [name] Other  Clinician: Referring Provider: Treating Provider/Extender: [name] in Treatment: 81 Verbal / Phone Orders: No Diagnosis Coding ICD-10 Coding Code Description C44.92 Squamous cell carcinoma of skin, unspecified L97.819 Non-pressure chronic ulcer of other part of right lower leg with unspecified severity L97.829 Non-pressure chronic ulcer of other part of left lower leg with unspecified severity I87.2 Venous insufficiency (chronic) (peripheral) Follow-up Appointments ppointment in 1 week. - with Dr. Heber [place] Return A Bathing/ Shower/ Hygiene May shower with protection but do not get wound dressing(s) wet. - Use cast protector Edema Control - Lymphedema / SCD / Other Elevate legs to the level of the heart or above for 30 minutes daily and/or when sitting, a frequency of: - elevate the legs throughout the day heart level if possible. Avoid standing for long periods of time. Exercise regularly Additional Orders / Instructions Follow Nutritious Diet Wound Treatment Wound #15 - Lower Leg Wound Laterality: Right, Posterior Cleanser: Soap and Water 1 x Per Week/15 Days Discharge Instructions: May shower and wash wound with dial antibacterial soap and water prior to dressing change. Cleanser: Wound Cleanser 1 x Per Week/15 Days Discharge Instructions: Cleanse the wound with wound cleanser prior to applying a clean dressing using gauze sponges, not tissue or cotton balls. Peri-Wound Care: Triamcinolone 15 (g) 1 x Per Week/15 Days Discharge Instructions: In clinic only. Use triamcinolone 15 (g) in clinic only. Peri-Wound Care: Sween Lotion (Moisturizing lotion) 1 x Per Week/15 Days Discharge Instructions: Apply moisturizing lotion as directed Prim Dressing: KerraCel Ag Gelling Fiber Dressing, 4x5 in (silver alginate) 1 x Per Week/15 Days ary Discharge Instructions: Apply silver alginate to wound bed as instructed Secondary Dressing: Woven Gauze Sponge,  Non-Sterile 4x4 in 1 x Per Week/15 Days Discharge Instructions: Apply over primary dressing as directed. Secondary Dressing: ABD Pad, 8x10 1 x Per Week/15 Days Discharge Instructions: Apply over primary dressing as directed. Compression Wrap: ThreePress (3 layer compression wrap) 1 x Per Week/15 Days Discharge Instructions: Apply three layer compression as directed. Wound #7R - Lower Leg Wound Laterality: Left, Circumferential Cleanser: Soap and Water 1 x Per Week/15 Days Discharge Instructions: May shower and wash wound with dial antibacterial soap and water prior to dressing change. Cleanser: Wound Cleanser 1 x Per Week/15 Days Discharge Instructions: Cleanse the wound with wound cleanser prior to applying a clean dressing using gauze sponges, not tissue or cotton balls. Peri-Wound Care: Triamcinolone 15 (g) 1 x Per Week/15 Days Discharge Instructions: In clinic only. Use triamcinolone 15 (g) in clinic only. Peri-Wound Care: Sween Lotion (Moisturizing lotion) 1 x Per Week/15 Days Discharge Instructions: Apply moisturizing lotion as directed Prim Dressing: KerraCel Ag Gelling Fiber Dressing, 4x5 in (silver alginate) 1 x Per Week/15 Days ary Discharge Instructions: Apply silver alginate to wound bed as instructed Secondary Dressing: Woven Gauze Sponge, Non-Sterile 4x4 in 1 x Per Week/15 Days Discharge Instructions: Apply over primary dressing as directed. Secondary Dressing: ABD Pad, 8x10 1 x Per Week/15 Days Discharge Instructions: Apply over primary dressing as directed. Compression Wrap: ThreePress (3 layer compression wrap) 1 x Per Week/15 Days Discharge Instructions: Apply three layer compression as directed. Electronic Signature(s) Signed: 01/03/2021 2:10:00 PM By: Kalman Shan DO Entered By: Kalman Shan on 01/03/2021 14:04:19 -------------------------------------------------------------------------------- Problem List Details Patient Name: Date of Service: [name], [place] 01/03/2021 1:15 PM Medical Record Number: [id number] Patient Account Number: [id number] Date of Birth/Sex: Treating RN: [birth year] (87 y.o. [name] Primary Care Provider: [name] Other Clinician: Referring Provider: Treating Provider/Extender: [name] in Treatment: 29 Active Problems ICD-10 Encounter Code Description Active Date MDM Diagnosis C44.92 Squamous cell carcinoma of skin, unspecified 06/11/2020 No Yes L97.812 Non-pressure chronic ulcer of other part of right lower leg with fat layer 06/11/2020 No Yes exposed L97.822 Non-pressure chronic ulcer of other part of left lower leg with fat layer exposed5/23/2022 No Yes I87.2 Venous insufficiency (chronic) (peripheral) 06/11/2020 No Yes Inactive Problems Resolved Problems Electronic Signature(s) Signed: 01/03/2021 2:10:00 PM By: Kalman Shan DO Entered By: Kalman Shan on 01/03/2021 13:57:21 -------------------------------------------------------------------------------- Progress Note Details Patient Name: Date of Service: [name], [place]. 01/03/2021 1:15 PM Medical Record Number: [id number] Patient Account Number: [id number] Date of Birth/Sex: Treating RN: 11-21-1925 (87 y.o. F) Primary Care Provider: [name] Other Clinician: Referring Provider: Treating Provider/Extender: [name] in Treatment: 73 Subjective Chief Complaint Information obtained from Patient Bilateral lower extremity wounds that have been biopsied and positive for squamous cell carcinoma History of Present Illness (HPI) The following HPI elements were documented for the patient's wound: Location: left leg Admission 5/23 Ms. 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 She states that the wound on the right has been present for a year. The 1 on the left has opened up 1 month ago. She is followed with oncology for this issue as she had biopsies that showed squamous cell carcinoma. She is also seeing radiation oncology for treatment options. She presents today because she would like  for her wounds to be healed by [place]. She currently denies signs of infection. 6/1; patient presents for 1 week follow-up. She states she has tolerated the leg wraps well. She states these do not bother her and is happy to continue with them. She is scheduled to see her oncologist today to go over treatment options for the bilateral lower extremity squamous cell carcinoma. Radiation is currently not a recommended option. Patient states she overall feels well. 6/22; patient presents for 3-week follow-up. She has tolerated the wraps well until her last wrap where she states they were uncomfortable. She attributes this to the home health nurse. She denies signs of infection. She has started her first treatment of antibody infusions for her Bilateral lower extremity squamous cell carcinoma. She has no complaints today. 7/21; patient presents for 1 month follow-up. Unfortunately she has not had good experience with her wrap changes with home health. She would like to do her own dressing changes. She continues to do her antibody infusions. She denies signs of infection. 7/28; patient presents for 1 week follow-up. At last clinic visit she was switched to daily dressing changes due to issues with the wrap and home health placing them. Unfortunately she has developed weeping to her legs bilaterally. She would like to be placed in wraps today. She would also like to follow with [place] weekly for wrap changes instead of having home health change them. She denies signs of infection. 8/4; patient presents for 1 week follow-up. She has tolerated the  Kerlix/Coban wraps well. She no longer has weeping to her legs. She took the wrap off 1 day before coming in to be able to take a shower. She has no issues or complaints today. She denies signs of infection. 8/18; patient presents for follow-up. Patient has tolerated the wraps well. She brought her Velcro compression wraps today. She has no issues or complaints today. She had her chemotherapy infusion yesterday without issues. She denies signs of infection. 8/25; patient presents for follow-up. She used her juxta lite compressions for the past week. It is unclear if she is able to put these on correctly since she states she has a hard time getting them to look right. She reports 2 open wounds. She currently denies signs of infection. 9/1; patient presents for 1 week follow-up. She has 1 open wound. She tolerated the compression wraps well. She currently denies signs of infection. 9/8; patient presents for 1 week follow-up. She has 2 open wounds 1 on each leg. She has tolerated the compression wrap well. She currently denies signs of infection. 9/15; patient presents for 1 week follow-up. She now has 3 wounds. 2 on the left and 1 on the right. She continues to tolerate the compression wrap well. She currently denies signs of infection. She has obtained furosemide by her primary care physician and would like to discuss when to take this. 9/22; patient presents for 1 week follow-up. She has scattered wounds on her lower extremities bilaterally. She did take furosemide twice in the past week. She does not recall having to urinate more frequently. She tolerated the 3 layer compression well. She denies signs of infection. 10/13; patient has not been here recently because of [place]. Apparently the facility was only putting gauze on her legs. This is a patient I do not normally see. 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 Ronnald Ramp at  [facility] dermatology. She is quite  convincing that she did not have radiation to her lower legs. It is likely she also has significant chronic venous insufficiency stasis dermatitis. We have been using silver alginate under kerlix Coban. She has obvious open areas medially on the right and areas on the left. She also has areas of extensive dry flaking adherent areas on the right and to a lesser extent on the left anterior. Nodular areas on the left lateral lower leg left posterior calf and right mid calf medially. 10/21; patient presents for follow-up. She has no issues or complaints today. She has tolerated the 3 layer compression well bilaterally. She denies signs of infection. 10/27; patient presents for follow-up. She continues to tolerate the 3 layer compression wrap well. She denies signs of infection. 11/30; patient presents for follow-up. She has no issues or complaints today. 11/10; patient arrived in clinic today for nurse visit accompanied by her daughter from [place]. The daughter had multiple questions so we turned this into doctors visit. Apparently after the last time I saw this woman in October she went to see Dr. Ronnald Ramp but he did not take the wraps off. She previously was treated with Cemiplimab for her squamous cell carcinoma. She did not receive radiation to her legs. I had wanted Dr. Ronnald Ramp to look at this because of the exceptionally damaged skin on her lower legs bilaterally. She has odd looking wounds on the left medial lower leg also extending posteriorly which we have not made a lot of progress on. But she also has a raised hyperkeratotic nodules on the right anterior tibial area plaques on the left medial lower thigh. These are not areas that are under compression. We have been using silver alginate on any open areas Liberal TCA under 3 layer compression. 11/17; patient presents for follow-up. She had her wraps taken off yesterday for her dermatology appointment. She reports  excessive weeping to her legs bilaterally after the wraps were taken off. She currently denies signs of infection. 12/12/2020 upon evaluation today patient appears to be doing about the same in regard to her wounds. She was actually started on Cipro after having biopsies apparently at her dermatology clinic she does not have the results back from the actual biopsy but the culture did return and apparently they placed her on the Cipro she started that just this morning. Other than that her legs appear to be doing okay at this time. 12/1; patient presents for follow-up. She has no issues or complaints today. She has started Lasix 40 mg daily to help with her bilateral leg swelling. 12/8; patient presents for follow-up. She has no issues or complaints today. 12/15; patient presents for 1 week follow-up. She has no issues or complaints today. Patient History Information obtained from Patient. Family History Unknown History. Social History Never smoker, Marital Status - Single, Alcohol Use - Never, Drug Use - No History, Caffeine Use - Never. Medical History Eyes Denies history of Cataracts, Optic Neuritis Ear/Nose/Mouth/Throat Denies history of Chronic sinus problems/congestion, Middle ear problems Hematologic/Lymphatic Patient has history of Anemia Denies history of Hemophilia, Human Immunodeficiency Virus, Lymphedema, Sickle Cell Disease Respiratory Denies history of Aspiration, Asthma, Chronic Obstructive Pulmonary Disease (COPD), Pneumothorax, Sleep Apnea, Tuberculosis Cardiovascular Patient has history of Arrhythmia - Atrial Flutter, A fibb, Congestive Heart Failure, Hypertension Denies history of Angina, Coronary Artery Disease, Deep Vein Thrombosis, Hypotension, Myocardial Infarction, Peripheral Arterial Disease, Peripheral Venous Disease, Phlebitis, Vasculitis Gastrointestinal Patient has history of Colitis Denies history of Cirrhosis , Crohnoos, Hepatitis A, Hepatitis B,  Hepatitis C Endocrine Denies history of Type I Diabetes, Type II Diabetes Genitourinary Denies history of End Stage Renal Disease Immunological Denies history of Lupus Erythematosus, Raynaudoos, Scleroderma Integumentary (Skin) Denies history of History of Burn Musculoskeletal Patient has history of Osteoarthritis Denies history of Gout, Rheumatoid Arthritis, Osteomyelitis Neurologic Denies history of Dementia, Neuropathy, Quadriplegia, Paraplegia, Seizure Disorder Oncologic Denies history of Received Chemotherapy, Received Radiation Hospitalization/Surgery History - removal of rod left leg. 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 Vitals Time Taken: 1:06 PM, Height: 65 in, Weight: 163 lbs, BMI: 27.1, Temperature: 98.5 F, Pulse: 87 bpm, Respiratory Rate: 18 breaths/min, Blood Pressure: 130/75 mmHg. Cardiovascular 2+ dorsalis pedis/posterior tibialis pulses. Psychiatric pleasant and cooperative. General Notes: Right lower extremity: Small open wound limited to skin breakdown. Left lower extremity: Scattered small wounds to the posterior aspect limited to skin breakdown. Good edema control bilaterally Integumentary (Hair, Skin) Wound #15 status is Open. Original cause of wound was Gradually Appeared. The date acquired was: 11/08/2020. The wound has been in treatment 8 weeks. The wound is located on the Right,Posterior Lower Leg. The wound measures 2.3cm length x 1.6cm width x 0.1cm depth; 2.89cm^2 area and 0.289cm^3 volume. There is Fat Layer (Subcutaneous Tissue) exposed. There is no tunneling or undermining noted. There is a medium amount of serosanguineous drainage noted. The wound margin is distinct with the outline attached to the wound base. There is  large (67-100%) pink granulation within the wound bed. There is no necrotic tissue within the wound bed. Wound #7R status is Open. Original cause of wound was Blister. The date acquired was: 04/20/2020. The wound has been in treatment 29 weeks. The wound is located on the Left,Circumferential Lower Leg. The wound measures 6.5cm length x 5.5cm width x 0.1cm depth; 28.078cm^2 area and 2.808cm^3 volume. There is Fat Layer (Subcutaneous Tissue) exposed. There is no tunneling or undermining noted. There is a medium amount of serosanguineous drainage noted. The wound margin is distinct with the outline attached to the wound base. There is large (67-100%) red granulation within the wound bed. There is no necrotic tissue within the wound bed. 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 No signs of infection on exam. I recommended continuing silver alginate under 3 layer compression. She continues to take Lasix daily which I think is helping with her lower extremity edema and wound healing. Procedures Wound #15 Pre-procedure diagnosis of Wound #15 is a Venous Leg Ulcer located on the Right,Posterior Lower Leg . There was a Three Layer Compression Therapy Procedure by Lorrin Jackson, RN. Post procedure Diagnosis Wound #15: Same as Pre-Procedure Wound #7R Pre-procedure diagnosis of Wound #7R is a Malignant Wound located on the Left,Circumferential Lower Leg . There was a Three Layer Compression Therapy Procedure by Lorrin Jackson, RN. Post procedure Diagnosis Wound #7R: Same as Pre-Procedure Plan Follow-up Appointments: Return Appointment in 1 week. - with Dr. Heber Lawton Bathing/  Shower/ Hygiene: May shower with protection but do not get wound dressing(s) wet. - Use cast protector Edema Control - Lymphedema / SCD / Other: Elevate legs to the level of the heart or above  for 30 minutes daily and/or when sitting, a frequency of: - elevate the legs throughout the day heart level if possible. Avoid standing for long periods of time. Exercise regularly Additional Orders / Instructions: Follow Nutritious Diet WOUND #15: - Lower Leg Wound Laterality: Right, Posterior Cleanser: Soap and Water 1 x Per Week/15 Days Discharge Instructions: May shower and wash wound with dial antibacterial soap and water prior to dressing change. Cleanser: Wound Cleanser 1 x Per Week/15 Days Discharge Instructions: Cleanse the wound with wound cleanser prior to applying a clean dressing using gauze sponges, not tissue or cotton balls. Peri-Wound Care: Triamcinolone 15 (g) 1 x Per Week/15 Days Discharge Instructions: In clinic only. Use triamcinolone 15 (g) in clinic only. Peri-Wound Care: Sween Lotion (Moisturizing lotion) 1 x Per Week/15 Days Discharge Instructions: Apply moisturizing lotion as directed Prim Dressing: KerraCel Ag Gelling Fiber Dressing, 4x5 in (silver alginate) 1 x Per Week/15 Days ary Discharge Instructions: Apply silver alginate to wound bed as instructed Secondary Dressing: Woven Gauze Sponge, Non-Sterile 4x4 in 1 x Per Week/15 Days Discharge Instructions: Apply over primary dressing as directed. Secondary Dressing: ABD Pad, 8x10 1 x Per Week/15 Days Discharge Instructions: Apply over primary dressing as directed. Com pression Wrap: ThreePress (3 layer compression wrap) 1 x Per Week/15 Days Discharge Instructions: Apply three layer compression as directed. WOUND #7R: - Lower Leg Wound Laterality: Left, Circumferential Cleanser: Soap and Water 1 x Per Week/15 Days Discharge Instructions: May shower and wash wound with dial antibacterial soap and water prior to dressing  change. Cleanser: Wound Cleanser 1 x Per Week/15 Days Discharge Instructions: Cleanse the wound with wound cleanser prior to applying a clean dressing using gauze sponges, not tissue or cotton balls. Peri-Wound Care: Triamcinolone 15 (g) 1 x Per Week/15 Days Discharge Instructions: In clinic only. Use triamcinolone 15 (g) in clinic only. Peri-Wound Care: Sween Lotion (Moisturizing lotion) 1 x Per Week/15 Days Discharge Instructions: Apply moisturizing lotion as directed Prim Dressing: KerraCel Ag Gelling Fiber Dressing, 4x5 in (silver alginate) 1 x Per Week/15 Days ary Discharge Instructions: Apply silver alginate to wound bed as instructed Secondary Dressing: Woven Gauze Sponge, Non-Sterile 4x4 in 1 x Per Week/15 Days Discharge Instructions: Apply over primary dressing as directed. Secondary Dressing: ABD Pad, 8x10 1 x Per Week/15 Days Discharge Instructions: Apply over primary dressing as directed. Com pression Wrap: ThreePress (3 layer compression wrap) 1 x Per Week/15 Days Discharge Instructions: Apply three layer compression as directed. 1. Silver alginate under 3 layer compression 2. Follow-up in 1 week Electronic Signature(s) Signed: 01/03/2021 2:10:00 PM By: Kalman Shan DO Entered By: Kalman Shan on 01/03/2021 14:09:23 -------------------------------------------------------------------------------- HxROS Details Patient Name: Date of Service: [name], [place]. 01/03/2021 1:15 PM Medical Record Number: [id number] Patient Account Number: [id number] Date of Birth/Sex: Treating RN: 1925-07-14 (87 y.o. F) Primary Care Provider: [name] Other Clinician: Referring Provider: Treating Provider/Extender: [name] in Treatment: 4 Information Obtained From Patient Eyes Medical History: Negative for: Cataracts; Optic Neuritis Ear/Nose/Mouth/Throat Medical History: Negative for: Chronic sinus problems/congestion; Middle ear  problems Hematologic/Lymphatic Medical History: Positive for: Anemia Negative for: Hemophilia; Human Immunodeficiency Virus; Lymphedema; Sickle Cell Disease Respiratory Medical History: Negative for: Aspiration; Asthma; Chronic Obstructive Pulmonary Disease (COPD); Pneumothorax; Sleep Apnea; Tuberculosis Cardiovascular Medical History: Positive for: Arrhythmia - Atrial Flutter, A fibb; Congestive Heart Failure; Hypertension Negative for: Angina; Coronary Artery Disease; Deep Vein Thrombosis; Hypotension; Myocardial Infarction; Peripheral Arterial Disease; Peripheral Venous Disease; Phlebitis; Vasculitis Past Medical History Notes: hyperlipidemia  Gastrointestinal Medical History: Positive for: Colitis Negative for: Cirrhosis ; Crohns; Hepatitis A; Hepatitis B; Hepatitis C Endocrine Medical History: Negative for: Type I Diabetes; Type II Diabetes Past Medical History Notes: hypothyroidism Genitourinary Medical History: Negative for: End Stage Renal Disease Immunological Medical History: Negative for: Lupus Erythematosus; Raynauds; Scleroderma Integumentary (Skin) Medical History: Negative for: History of Burn Musculoskeletal Medical History: Positive for: Osteoarthritis Negative for: Gout; Rheumatoid Arthritis; Osteomyelitis Neurologic Medical History: Negative for: Dementia; Neuropathy; Quadriplegia; Paraplegia; Seizure Disorder Past Medical History Notes: lumbar spindylolysis Oncologic Medical History: Negative for: Received Chemotherapy; Received Radiation Past Medical History Notes: BLE squamous ceel carcionoma Immunizations Pneumococcal Vaccine: Received Pneumococcal Vaccination: Yes Received Pneumococcal Vaccination On or After 60th Birthday: No Implantable Devices None Hospitalization / Surgery History Type of Hospitalization/Surgery removal of rod left leg Family and Social History Unknown History: Yes; Never smoker; Marital Status - Single; Alcohol  Use: Never; Drug Use: No History; Caffeine Use: Never; Financial Concerns: No; Food, Clothing or Shelter Needs: No; Support System Lacking: No; Transportation Concerns: No Electronic Signature(s) Signed: 01/03/2021 2:10:00 PM By: Kalman Shan DO Entered By: Kalman Shan on 01/03/2021 13:58:26 -------------------------------------------------------------------------------- SuperBill Details Patient Name: Date of Service: [name], [place]. 01/03/2021 Medical Record Number: [id number] Patient Account Number: [id number] Date of Birth/Sex: Treating RN: December 01, 1925 (87 y.o. [name] Primary Care Provider: [name] Other Clinician: Referring Provider: Treating Provider/Extender: [name] in Treatment: 29 Diagnosis Coding ICD-10 Codes Code Description C44.92 Squamous cell carcinoma of skin, unspecified L97.819 Non-pressure chronic ulcer of other part of right lower leg with unspecified severity L97.829 Non-pressure chronic ulcer of other part of left lower leg with unspecified severity I87.2 Venous insufficiency (chronic) (peripheral) Facility Procedures CPT4: Code [id number] Description: 81 BILATERAL: Application of multi-layer venous compression system; leg (below knee), including ankle and t. ICD-10 Diagnosis Description L97.819 Non-pressure chronic ulcer of other part of right lower leg with unspecified severity L97.829  Non-pressure chronic ulcer of other part of left lower leg with unspecified severity Modifier: Quantity: 1 Physician Procedures : CPT4 Code Description Modifier [id number] - WC PHYS LEVEL 3 - EST PT ICD-10 Diagnosis Description L97.819 Non-pressure chronic ulcer of other part of right lower leg with unspecified severity L97.829 Non-pressure chronic ulcer of other part of left  lower leg with unspecified severity C44.92 Squamous cell carcinoma of skin, unspecified I87.2 Venous insufficiency (chronic)  (peripheral) Quantity: 1 Electronic Signature(s) Signed: 01/03/2021 2:10:00 PM By: Kalman Shan DO Entered By: Kalman Shan on 01/03/2021 14:09:39

## 2021-01-03 NOTE — Telephone Encounter (Signed)
Patient's daughter [name] requesting a call to discuss if scheduled 01-10-2021 appt is needed for patient

## 2021-01-03 NOTE — Telephone Encounter (Signed)
Called daughter she states since mom is going to the wound clinic weekly to get her leg check and wrap, and she is know taking lasix & potassium don't need to keep 12/22 appt. Inform daughter can cancel, but she would need to keep January appt for her 3 month f/u. Also she states mom received msg about getting a prolia injection. Inform her that is for her osteoporosis. Daughter states mom going through so much and she haas had breast cancer is this something that she really need bcz it is hard getting her here. Inform daughter will ask MD and give her a call back w/ response.Marland KitchenJohny Chess

## 2021-01-03 NOTE — Telephone Encounter (Signed)
Pt last saw Kathyrn Drown, NP on 03/07/20, last labs 11/07/20 Creat 0.86, age 87, weight 70.9kg, based on specified criteria pt is on appropriate dosage of Eliquis 5mg  BID for afib.  Will refill rx.

## 2021-01-04 NOTE — Telephone Encounter (Signed)
Noted! Thank you

## 2021-01-08 ENCOUNTER — Telehealth: Payer: Medicare Other | Admitting: Oncology

## 2021-01-09 ENCOUNTER — Ambulatory Visit: Payer: Medicare Other

## 2021-01-09 ENCOUNTER — Other Ambulatory Visit: Payer: Medicare Other

## 2021-01-10 ENCOUNTER — Encounter (HOSPITAL_BASED_OUTPATIENT_CLINIC_OR_DEPARTMENT_OTHER): Payer: Medicare Other | Admitting: Internal Medicine

## 2021-01-10 ENCOUNTER — Ambulatory Visit: Payer: Medicare Other | Admitting: Internal Medicine

## 2021-01-10 ENCOUNTER — Other Ambulatory Visit: Payer: Self-pay

## 2021-01-10 DIAGNOSIS — I110 Hypertensive heart disease with heart failure: Secondary | ICD-10-CM | POA: Diagnosis not present

## 2021-01-10 DIAGNOSIS — C4492 Squamous cell carcinoma of skin, unspecified: Secondary | ICD-10-CM | POA: Diagnosis not present

## 2021-01-10 DIAGNOSIS — I509 Heart failure, unspecified: Secondary | ICD-10-CM | POA: Diagnosis not present

## 2021-01-10 DIAGNOSIS — I872 Venous insufficiency (chronic) (peripheral): Secondary | ICD-10-CM | POA: Diagnosis not present

## 2021-01-10 DIAGNOSIS — L97812 Non-pressure chronic ulcer of other part of right lower leg with fat layer exposed: Secondary | ICD-10-CM

## 2021-01-10 DIAGNOSIS — L97822 Non-pressure chronic ulcer of other part of left lower leg with fat layer exposed: Secondary | ICD-10-CM | POA: Diagnosis not present

## 2021-01-15 ENCOUNTER — Encounter (HOSPITAL_BASED_OUTPATIENT_CLINIC_OR_DEPARTMENT_OTHER): Payer: Medicare Other | Admitting: Internal Medicine

## 2021-01-15 ENCOUNTER — Other Ambulatory Visit: Payer: Self-pay

## 2021-01-15 DIAGNOSIS — L97822 Non-pressure chronic ulcer of other part of left lower leg with fat layer exposed: Secondary | ICD-10-CM | POA: Diagnosis not present

## 2021-01-15 DIAGNOSIS — I110 Hypertensive heart disease with heart failure: Secondary | ICD-10-CM | POA: Diagnosis not present

## 2021-01-15 DIAGNOSIS — I872 Venous insufficiency (chronic) (peripheral): Secondary | ICD-10-CM | POA: Diagnosis not present

## 2021-01-15 DIAGNOSIS — L97812 Non-pressure chronic ulcer of other part of right lower leg with fat layer exposed: Secondary | ICD-10-CM | POA: Diagnosis not present

## 2021-01-15 DIAGNOSIS — I509 Heart failure, unspecified: Secondary | ICD-10-CM | POA: Diagnosis not present

## 2021-01-15 NOTE — Progress Notes (Signed)
[name] ([id number]) , Visit Report for 01/15/2021 Arrival Information Details Patient Name: Date of Service: [name], [place]. 01/15/2021 11:30 A M Medical Record Number: [id number] Patient Account Number: [id number] Date of Birth/Sex: Treating RN: 03/02/1925 (87 y.o. [name] Primary Care Tysha Grismore: [name] Other Clinician: Referring Breeona Waid: Treating Brinleigh Tew/Extender: [name] in Treatment: 42 Visit Information History Since Last Visit Added or deleted any medications: No Patient Arrived: [place] Any new allergies or adverse reactions: No Arrival Time: 11:24 Had a fall or experienced change in No Accompanied By: self activities of daily living that may affect Transfer Assistance: Manual risk of falls: Patient Identification Verified: Yes Signs or symptoms of abuse/neglect since last visito No Secondary Verification Process Completed: Yes Hospitalized since last visit: No Patient Requires Transmission-Based No Implantable device outside of the clinic excluding No Precautions: cellular tissue based products placed in the center Patient Has Alerts: Yes since last visit: Patient Alerts: R ABI = Non compressible Has Dressing in Place as Prescribed: Yes L ABI = Non Pain Present Now: No compressible Electronic Signature(s) Signed: 01/15/2021 1:16:02 PM By: Rhae Hammock RN Entered By: Rhae Hammock on 01/15/2021 11:25:00 -------------------------------------------------------------------------------- Compression Therapy Details Patient Name: Date of Service: [name], [name] 01/15/2021 11:30 A M Medical Record Number: [id number] Patient Account Number: [id number] Date of Birth/Sex: Treating RN: 1925-04-19 (87 y.o. [name] Primary Care Tyanna Hach: [name] Other Clinician: Referring Mallie Linnemann: Treating Dyshon Philbin/Extender: [name] in Treatment:  31 Compression Therapy Performed for Wound Assessment: Wound #15 Right,Posterior Lower Leg Performed By: Clinician Rhae Hammock, RN Compression Type: Three Layer Electronic Signature(s) Signed: 01/15/2021 1:16:02 PM By: Rhae Hammock RN Entered By: Rhae Hammock on 01/15/2021 12:05:20 -------------------------------------------------------------------------------- Compression Therapy Details Patient Name: Date of Service: [name], [name]. 01/15/2021 11:30 A M Medical Record Number: [id number] Patient Account Number: [id number] Date of Birth/Sex: Treating RN: [birth year] (87 y.o. [name] Primary Care [name]: Other Clinician: [name] Referring Liana Camerer: Treating [name]/Extender: [name] in Treatment: 31 Compression Therapy Performed for Wound Assessment: Wound #7R Left,Circumferential Lower Leg Performed By: Clinician Rhae Hammock, RN Compression Type: Three Layer Electronic Signature(s) Signed: 01/15/2021 1:16:02 PM By: Rhae Hammock RN Entered By: Rhae Hammock on 01/15/2021 12:05:20 -------------------------------------------------------------------------------- Encounter Discharge Information Details Patient Name: Date of Service: [name], [name]. 01/15/2021 11:30 A M Medical Record Number: [id number] Patient Account Number: [id number] Date of Birth/Sex: Treating RN: 06/22/1925 (87 y.o. [name] Primary Care Janavia Rottman: [name] Other Clinician: Referring [name]: Treating [name]/Extender: [name] in Treatment: 62 Encounter Discharge Information Items Discharge Condition: Stable Ambulatory Status: Ambulatory Discharge Destination: Home Transportation: Private Auto Accompanied By: self Schedule Follow-up Appointment: Yes Clinical Summary of Care: Patient Declined Electronic Signature(s) Signed: 01/15/2021 1:16:02 PM  By: Rhae Hammock RN Entered By: Rhae Hammock on 01/15/2021 12:05:53 -------------------------------------------------------------------------------- Patient/Caregiver Education Details Patient Name: Date of Service: [name], [name] 12/27/2022andnbsp11:30 [place] Record Number: [id number] Patient Account Number: [id number] Date of Birth/Gender: Treating RN: [birth year] (87 y.o. [name] Primary Care Physician: [name] Other Clinician: Referring Physician: Treating Physician/Extender: [name] in Treatment: 77 Education Assessment Education Provided To: Patient Education Topics Provided Venous: Methods: Explain/Verbal Responses: State content correctly Electronic Signature(s) Signed: 01/15/2021 1:16:02 PM By: Rhae Hammock RN Entered By: Rhae Hammock on 01/15/2021 12:05:40 -------------------------------------------------------------------------------- Wound Assessment Details Patient Name: Date of Service: KIV  ETT, [name] 01/15/2021 11:30 A M Medical Record Number: [id number] Patient Account Number: [id number] Date of Birth/Sex: Treating RN: 1925-11-27 (87 y.o. [name] Primary Care [name]: [name] Other Clinician: Referring [name]: Treating [name]/Extender: [name] in Treatment: 31 Wound Status Wound Number: 15 Primary Etiology: Venous Leg Ulcer Wound Location: Right, Posterior Lower Leg Wound Status: Open Wounding Event: Gradually Appeared Date Acquired: 11/08/2020 Weeks Of Treatment: 9 Clustered Wound: Yes Wound Measurements Length: (cm) 0.5 Width: (cm) 0.3 Depth: (cm) 0.1 Area: (cm) 0.118 Volume: (cm) 0.012 % Reduction in Area: 97% % Reduction in Volume: 96.9% Wound Description Classification: Full Thickness Without Exposed Support Structu Exudate Amount: Medium Exudate Type: Serosanguineous Exudate  Color: red, brown res Treatment Notes Wound #15 (Lower Leg) Wound Laterality: Right, Posterior Cleanser Soap and Water Discharge Instruction: May shower and wash wound with dial antibacterial soap and water prior to dressing change. Wound Cleanser Discharge Instruction: Cleanse the wound with wound cleanser prior to applying a clean dressing using gauze sponges, not tissue or cotton balls. Peri-Wound Care Triamcinolone 15 (g) Discharge Instruction: In clinic only. Use triamcinolone 15 (g) in clinic only. Sween Lotion (Moisturizing lotion) Discharge Instruction: Apply moisturizing lotion as directed Topical Primary Dressing KerraCel Ag Gelling Fiber Dressing, 4x5 in (silver alginate) Discharge Instruction: Apply silver alginate to wound bed as instructed Secondary Dressing Woven Gauze Sponge, Non-Sterile 4x4 in Discharge Instruction: Apply over primary dressing as directed. ABD Pad, 8x10 Discharge Instruction: Apply over primary dressing as directed. Secured With Compression Wrap ThreePress (3 layer compression wrap) Discharge Instruction: Apply three layer compression as directed. Compression Stockings Add-Ons Electronic Signature(s) Signed: 01/15/2021 1:16:02 PM By: Rhae Hammock RN Entered By: Rhae Hammock on 01/15/2021 11:30:13 -------------------------------------------------------------------------------- Wound Assessment Details Patient Name: Date of Service: [name], [name] 01/15/2021 11:30 A M Medical Record Number: [id number] Patient Account Number: [id number] Date of Birth/Sex: Treating RN: 1925/09/29 (87 y.o. [name] Primary Care Naw Lasala: [name] Other Clinician: Referring Darcey Demma: Treating Benford Asch/Extender: [name] in Treatment: 31 Wound Status Wound Number: 7R Primary Etiology: Malignant Wound Wound Location: Left, Circumferential Lower Leg Wound Status: Open Wounding Event:  Blister Date Acquired: 04/20/2020 Weeks Of Treatment: 31 Clustered Wound: No Wound Measurements Length: (cm) 6.1 Width: (cm) 11.2 Depth: (cm) 0.1 Area: (cm) 53.658 Volume: (cm) 5.366 % Reduction in Area: -1647.2% % Reduction in Volume: -1647.9% Wound Description Classification: Full Thickness Without Exposed Support Structu Exudate Amount: Medium Exudate Type: Serosanguineous Exudate Color: red, brown res Treatment Notes Wound #7R (Lower Leg) Wound Laterality: Left, Circumferential Cleanser Soap and Water Discharge Instruction: May shower and wash wound with dial antibacterial soap and water prior to dressing change. Wound Cleanser Discharge Instruction: Cleanse the wound with wound cleanser prior to applying a clean dressing using gauze sponges, not tissue or cotton balls. Peri-Wound Care Triamcinolone 15 (g) Discharge Instruction: In clinic only. Use triamcinolone 15 (g) in clinic only. Sween Lotion (Moisturizing lotion) Discharge Instruction: Apply moisturizing lotion as directed Topical Primary Dressing KerraCel Ag Gelling Fiber Dressing, 4x5 in (silver alginate) Discharge Instruction: Apply silver alginate to wound bed as instructed Secondary Dressing Woven Gauze Sponge, Non-Sterile 4x4 in Discharge Instruction: Apply over primary dressing as directed. ABD Pad, 8x10 Discharge Instruction: Apply over primary dressing as directed. Secured With Compression Wrap ThreePress (3 layer compression wrap) Discharge Instruction: Apply three layer compression as directed. Compression Stockings Add-Ons Electronic Signature(s) Signed: 01/15/2021 1:16:02 PM By: Rhae Hammock RN Entered By: Rhae Hammock on  01/15/2021 11:30:13 -------------------------------------------------------------------------------- Vitals Details Patient Name: Date of Service: [name], [name]. 01/15/2021 11:30 A M Medical Record Number: [id number] Patient Account Number: [id number] Date of  Birth/Sex: Treating RN: January 03, 1926 (87 y.o. [name] Primary Care Blossom Crume: [name] Other Clinician: Referring Charistopher Rumble: Treating Kie Calvin/Extender: [name] in Treatment: 31 Vital Signs Time Taken: 11:25 Temperature (F): 97.4 Height (in): 65 Pulse (bpm): 74 Weight (lbs): 163 Respiratory Rate (breaths/min): 17 Body Mass Index (BMI): 27.1 Blood Pressure (mmHg): 109/70 Reference Range: 80 - 120 mg / dl Electronic Signature(s) Signed: 01/15/2021 1:16:02 PM By: Rhae Hammock RN Entered By: Rhae Hammock on 01/15/2021 11:29:49

## 2021-01-16 NOTE — Progress Notes (Signed)
GOLDIA LIGMAN ([id number]) , Visit Report for 01/10/2021 Arrival Information Details Patient Name: Date of Service: [name], [place] 01/10/2021 1:15 PM Medical Record Number: [id number] Patient Account Number: [id number] Date of Birth/Sex: Treating RN: 03-10-1925 (87 y.o. [name] Primary Care Elena Davia: [name] Other Clinician: Referring Colena Ketterman: Treating Temeka Pore/Extender: [name] in Treatment: 20 Visit Information History Since Last Visit Added or deleted any medications: No Patient Arrived: [place] Any new allergies or adverse reactions: No Arrival Time: 13:09 Had a fall or experienced change in No Accompanied By: self activities of daily living that may affect Transfer Assistance: None risk of falls: Patient Requires Transmission-Based No Signs or symptoms of abuse/neglect since last visito No Precautions: Hospitalized since last visit: No Patient Has Alerts: Yes Implantable device outside of the clinic excluding No Patient Alerts: R ABI = Non compressible cellular tissue based products placed in the center L ABI = Non compressible since last visit: Has Dressing in Place as Prescribed: Yes Has Compression in Place as Prescribed: Yes Pain Present Now: No Electronic Signature(s) Signed: 01/10/2021 4:57:12 PM By: Levan Hurst RN, BSN Entered By: Levan Hurst on 01/10/2021 13:11:01 -------------------------------------------------------------------------------- Compression Therapy Details Patient Name: Date of Service: [name], Dashayla A. 01/10/2021 1:15 PM Medical Record Number: [id number] Patient Account Number: [id number] Date of Birth/Sex: Treating RN: 04/20/1925 (87 y.o. [name] Primary Care Rajiv Parlato: [name] Other Clinician: Referring Arie Gable: Treating Phillis Thackeray/Extender: [name] in Treatment: 30 Compression Therapy Performed for Wound  Assessment: Wound #15 Right,Posterior Lower Leg Performed By: Clinician Levan Hurst, RN Compression Type: Three Layer Post Procedure Diagnosis Same as Pre-procedure Electronic Signature(s) Signed: 01/10/2021 4:57:12 PM By: Levan Hurst RN, BSN Entered By: Levan Hurst on 01/10/2021 13:26:03 -------------------------------------------------------------------------------- Compression Therapy Details Patient Name: Date of Service: [name], [place]. 01/10/2021 1:15 PM Medical Record Number: [id number] Patient Account Number: [id number] Date of Birth/Sex: Treating RN: [birth year] (87 y.o. [name] Primary Care Henchy Mccauley: [name] Other Clinician: Referring Bemnet Trovato: Treating Naylee Frankowski/Extender: [name] in Treatment: 30 Compression Therapy Performed for Wound Assessment: Wound #7R Left,Circumferential Lower Leg Performed By: Clinician Levan Hurst, RN Compression Type: Three Layer Post Procedure Diagnosis Same as Pre-procedure Electronic Signature(s) Signed: 01/10/2021 4:57:12 PM By: Levan Hurst RN, BSN Entered By: Levan Hurst on 01/10/2021 13:26:03 -------------------------------------------------------------------------------- Encounter Discharge Information Details Patient Name: Date of Service: [name], [place] 01/10/2021 1:15 PM Medical Record Number: [id number] Patient Account Number: [id number] Date of Birth/Sex: Treating RN: Sep 29, 1925 (87 y.o. [name], [name] Primary Care Jamilyn Pigeon: [name] Other Clinician: Referring Massiah Minjares: Treating Shaniqua Guillot/Extender: [name] in Treatment: 30 Encounter Discharge Information Items Discharge Condition: Stable Ambulatory Status: Ambulatory Discharge Destination: Home Transportation: Private Auto Accompanied By: self Schedule Follow-up Appointment: Yes Clinical Summary of Care: Electronic Signature(s) Signed:  01/15/2021 1:16:02 PM By: Rhae Hammock RN Entered By: Rhae Hammock on 01/10/2021 14:03:09 -------------------------------------------------------------------------------- Lower Extremity Assessment Details Patient Name: Date of Service: [name], [place] 01/10/2021 1:15 PM Medical Record Number: [id number] Patient Account Number: [id number] Date of Birth/Sex: Treating RN: 02/14/1925 (87 y.o. [name] Primary Care Syncere Eble: [name] Other Clinician: Referring Adelynn Gipe: Treating Jadynn Epping/Extender: [name] in Treatment: 30 Edema Assessment Assessed: [Left: No] [Right: No] Edema: [Left: Yes] [Right: Yes] Calf Left: Right: Point of Measurement: 30 cm From Medial Instep 34.1 cm 31.5 cm Ankle Left: Right: Point of Measurement: 9 cm  From Medial Instep 24.3 cm 21.5 cm Electronic Signature(s) Signed: 01/10/2021 4:57:12 PM By: Levan Hurst RN, BSN Entered By: Levan Hurst on 01/10/2021 13:14:11 -------------------------------------------------------------------------------- Multi Wound Chart Details Patient Name: Date of Service: [name], [place] 01/10/2021 1:15 PM Medical Record Number: [id number] Patient Account Number: [id number] Date of Birth/Sex: Treating RN: 04/26/1925 (87 y.o. [name] Primary Care Hope Brandenburger: [name] Other Clinician: Referring Ketrina Boateng: Treating Amen Staszak/Extender: [name] in Treatment: 30 Vital Signs Height(in): 65 Pulse(bpm): 75 Weight(lbs): 163 Blood Pressure(mmHg): 164/83 Body Mass Index(BMI): 27 Temperature(F): 97.3 Respiratory Rate(breaths/min): 18 Photos: [N/A:N/A] Right, Posterior Lower Leg Left, Circumferential Lower Leg N/A Wound Location: Gradually Appeared Blister N/A Wounding Event: Venous Leg Ulcer Malignant Wound N/A Primary Etiology: Anemia, Arrhythmia, Congestive Heart Anemia, Arrhythmia, Congestive Heart  N/A Comorbid History: Failure, Hypertension, Colitis, Failure, Hypertension, Colitis, Osteoarthritis Osteoarthritis 11/08/2020 04/20/2020 N/A Date Acquired: 9 30 N/A Weeks of Treatment: Open Open N/A Wound Status: No Yes N/A Wound Recurrence: Yes No N/A Clustered Wound: 1 N/A N/A Clustered Quantity: 0.5x0.3x0.1 6.1x11.2x0.1 N/A Measurements L x W x D (cm) 0.118 53.658 N/A A (cm) : rea 0.012 5.366 N/A Volume (cm) : 97.00% -1647.20% N/A % Reduction in Area: 96.90% -1647.90% N/A % Reduction in Volume: Full Thickness Without Exposed Full Thickness Without Exposed N/A Classification: Support Structures Support Structures Medium Medium N/A Exudate Amount: Serosanguineous Serosanguineous N/A Exudate Type: red, brown red, brown N/A Exudate Color: Distinct, outline attached Distinct, outline attached N/A Wound Margin: Large (67-100%) Large (67-100%) N/A Granulation Amount: Pink Red, Pink N/A Granulation Quality: None Present (0%) None Present (0%) N/A Necrotic Amount: Fat Layer (Subcutaneous Tissue): Yes Fat Layer (Subcutaneous Tissue): Yes N/A Exposed Structures: Fascia: No Fascia: No Tendon: No Tendon: No Muscle: No Muscle: No Joint: No Joint: No Bone: No Bone: No Large (67-100%) Medium (34-66%) N/A Epithelialization: Compression Therapy Compression Therapy N/A Procedures Performed: Treatment Notes Electronic Signature(s) Signed: 01/10/2021 1:35:23 PM By: Kalman Shan DO Signed: 01/10/2021 4:57:12 PM By: Levan Hurst RN, BSN Entered By: Kalman Shan on 01/10/2021 13:32:01 -------------------------------------------------------------------------------- Multi-Disciplinary Care Plan Details Patient Name: Date of Service: [name], [place] A. 01/10/2021 1:15 PM Medical Record Number: [id number] Patient Account Number: [id number] Date of Birth/Sex: Treating RN: [birth year] (87 y.o. [name] Primary Care Lucresha Dismuke: [name] Other  Clinician: Referring Benedict Kue: Treating Sarabeth Benton/Extender: [name] in Treatment: [place] reviewed with physician Active Inactive Venous Leg Ulcer Nursing Diagnoses: Knowledge deficit related to disease process and management Potential for venous Insuffiency (use before diagnosis confirmed) Goals: Patient will maintain optimal edema control Date Initiated: 11/01/2020 Target Resolution Date: 01/24/2021 Goal Status: Active Interventions: Assess peripheral edema status every visit. Compression as ordered Provide education on venous insufficiency Treatment Activities: Therapeutic compression applied : 11/01/2020 Notes: 12/27/20: Edema control ongoing Wound/Skin Impairment Nursing Diagnoses: Impaired tissue integrity Knowledge deficit related to ulceration/compromised skin integrity Goals: Patient/caregiver will verbalize understanding of skin care regimen Date Initiated: 06/11/2020 Target Resolution Date: 01/24/2021 Goal Status: Active Interventions: Assess patient/caregiver ability to obtain necessary supplies Assess patient/caregiver ability to perform ulcer/skin care regimen upon admission and as needed Assess ulceration(s) every visit Provide education on ulcer and skin care Notes: 10/11/20: Wound care regimen ongoing. 12/27/20: Wound care continues Electronic Signature(s) Signed: 01/10/2021 4:57:12 PM By: Levan Hurst RN, BSN Entered By: Levan Hurst on 01/10/2021 13:20:21 -------------------------------------------------------------------------------- Pain Assessment Details Patient Name: Date of Service: [name], [place]. 01/10/2021 1:15 PM Medical Record Number: [id number] Patient Account Number:  [id number] Date of Birth/Sex: Treating RN: 02-12-1925 (87 y.o. [name] Primary Care Kiwanna Spraker: [name] Other Clinician: Referring Oneika Simonian: Treating Iasha Mccalister/Extender: [name] in Treatment: 30 Active Problems Location of Pain Severity and Description of Pain Patient Has Paino No Site Locations Pain Management and Medication Current Pain Management: Electronic Signature(s) Signed: 01/10/2021 4:57:12 PM By: Levan Hurst RN, BSN Entered By: Levan Hurst on 01/10/2021 13:12:09 -------------------------------------------------------------------------------- Patient/Caregiver Education Details Patient Name: Date of Service: [name], [place] 12/22/2022andnbsp1:15 PM Medical Record Number: [id number] Patient Account Number: [id number] Date of Birth/Gender: Treating RN: [birth year] (87 y.o. [name] Primary Care Physician: [name] Other Clinician: Referring Physician: Treating Physician/Extender: [name] in Treatment: 30 Education Assessment Education Provided To: Patient Education Topics Provided Wound/Skin Impairment: Methods: Explain/Verbal Responses: State content correctly [employer]) Signed: 01/10/2021 4:57:12 PM By: Levan Hurst RN, BSN Entered By: Levan Hurst on 01/10/2021 13:20:40 -------------------------------------------------------------------------------- Wound Assessment Details Patient Name: Date of Service: [name], [place] 01/10/2021 1:15 PM Medical Record Number: [id number] Patient Account Number: [id number] Date of Birth/Sex: Treating RN: October 22, 1925 (87 y.o. [name] Primary Care Virda Betters: [name] Other Clinician: Referring Doniven Vanpatten: Treating Kieu Quiggle/Extender: [name] in Treatment: 30 Wound Status Wound Number: 15 Primary Venous Leg Ulcer Etiology: Wound Location: Right, Posterior Lower Leg Wound Open Wounding Event: Gradually Appeared Status: Date Acquired: 11/08/2020 Comorbid Anemia, Arrhythmia, Congestive Heart Failure, Hypertension, Weeks Of  Treatment: 9 History: Colitis, Osteoarthritis Clustered Wound: Yes Photos Wound Measurements Length: (cm) 0 Width: (cm) 0 Depth: (cm) 0 Clustered Quantity: 1 Area: (cm) Volume: (cm) .5 % Reduction in Area: 97% .3 % Reduction in Volume: 96.9% .1 Epithelialization: Large (67-100%) Tunneling: No 0.118 Undermining: No 0.012 Wound Description Classification: Full Thickness Without Exposed Support Structures Wound Margin: Distinct, outline attached Exudate Amount: Medium Exudate Type: Serosanguineous Exudate Color: red, brown Foul Odor After Cleansing: No Slough/Fibrino No Wound Bed Granulation Amount: Large (67-100%) Exposed Structure Granulation Quality: Pink Fascia Exposed: No Necrotic Amount: None Present (0%) Fat Layer (Subcutaneous Tissue) Exposed: Yes Tendon Exposed: No Muscle Exposed: No Joint Exposed: No Bone Exposed: No Treatment Notes Wound #15 (Lower Leg) Wound Laterality: Right, Posterior Cleanser Soap and Water Discharge Instruction: May shower and wash wound with dial antibacterial soap and water prior to dressing change. Wound Cleanser Discharge Instruction: Cleanse the wound with wound cleanser prior to applying a clean dressing using gauze sponges, not tissue or cotton balls. Peri-Wound Care Triamcinolone 15 (g) Discharge Instruction: In clinic only. Use triamcinolone 15 (g) in clinic only. Sween Lotion (Moisturizing lotion) Discharge Instruction: Apply moisturizing lotion as directed Topical Primary Dressing KerraCel Ag Gelling Fiber Dressing, 4x5 in (silver alginate) Discharge Instruction: Apply silver alginate to wound bed as instructed Secondary Dressing Woven Gauze Sponge, Non-Sterile 4x4 in Discharge Instruction: Apply over primary dressing as directed. ABD Pad, 8x10 Discharge Instruction: Apply over primary dressing as directed. Secured With Compression Wrap ThreePress (3 layer compression wrap) Discharge Instruction: Apply three layer  compression as directed. Compression Stockings Add-Ons Electronic Signature(s) Signed: 01/10/2021 4:57:12 PM By: Levan Hurst RN, BSN Signed: 01/16/2021 4:57:51 PM By: Dellie Catholic RN Entered By: Dellie Catholic on 01/10/2021 13:23:23 -------------------------------------------------------------------------------- Wound Assessment Details Patient Name: Date of Service: [name], [place] 01/10/2021 1:15 PM Medical Record Number: [id number] Patient Account Number: [id number] Date of Birth/Sex: Treating RN: [birth year] (87 y.o. [name] Primary Care Dianara Smullen: [name] Other Clinician: Referring Vici Novick: Treating Maveric Debono/Extender:  Hoffman, Jessica [name], Aleksei V Weeks in Treatment: 30 Wound Status Wound Number: 7R Primary Malignant Wound Etiology: Wound Location: Left, Circumferential Lower Leg Wound Open Wounding Event: Blister Status: Date Acquired: 04/20/2020 Comorbid Anemia, Arrhythmia, Congestive Heart Failure, Hypertension, Weeks Of Treatment: 30 History: Colitis, Osteoarthritis Clustered Wound: No Photos Wound Measurements Length: (cm) 6.1 Width: (cm) 11.2 Depth: (cm) 0.1 Area: (cm) 53.658 Volume: (cm) 5.366 % Reduction in Area: -1647.2% % Reduction in Volume: -1647.9% Epithelialization: Medium (34-66%) Tunneling: No Undermining: No Wound Description Classification: Full Thickness Without Exposed Support Structures Wound Margin: Distinct, outline attached Exudate Amount: Medium Exudate Type: Serosanguineous Exudate Color: red, brown Foul Odor After Cleansing: No Slough/Fibrino No Wound Bed Granulation Amount: Large (67-100%) Exposed Structure Granulation Quality: Red, Pink Fascia Exposed: No Necrotic Amount: None Present (0%) Fat Layer (Subcutaneous Tissue) Exposed: Yes Tendon Exposed: No Muscle Exposed: No Joint Exposed: No Bone Exposed: No Treatment Notes Wound #7R (Lower Leg) Wound Laterality: Left,  Circumferential Cleanser Soap and Water Discharge Instruction: May shower and wash wound with dial antibacterial soap and water prior to dressing change. Wound Cleanser Discharge Instruction: Cleanse the wound with wound cleanser prior to applying a clean dressing using gauze sponges, not tissue or cotton balls. Peri-Wound Care Triamcinolone 15 (g) Discharge Instruction: In clinic only. Use triamcinolone 15 (g) in clinic only. Sween Lotion (Moisturizing lotion) Discharge Instruction: Apply moisturizing lotion as directed Topical Primary Dressing KerraCel Ag Gelling Fiber Dressing, 4x5 in (silver alginate) Discharge Instruction: Apply silver alginate to wound bed as instructed Secondary Dressing Woven Gauze Sponge, Non-Sterile 4x4 in Discharge Instruction: Apply over primary dressing as directed. ABD Pad, 8x10 Discharge Instruction: Apply over primary dressing as directed. Secured With Compression Wrap ThreePress (3 layer compression wrap) Discharge Instruction: Apply three layer compression as directed. Compression Stockings Add-Ons Electronic Signature(s) Signed: 01/10/2021 4:57:12 PM By: Levan Hurst RN, BSN Signed: 01/16/2021 4:57:51 PM By: Dellie Catholic RN Entered By: Dellie Catholic on 01/10/2021 13:22:14 -------------------------------------------------------------------------------- Vitals Details Patient Name: Date of Service: [name], [place] 01/10/2021 1:15 PM Medical Record Number: [id number] Patient Account Number: [id number] Date of Birth/Sex: Treating RN: May 03, 1925 (87 y.o. [name] Primary Care Jamen Loiseau: [name] Other Clinician: Referring Margreat Widener: Treating Vrinda Heckstall/Extender: [name] in Treatment: 30 Vital Signs Time Taken: 13:11 Temperature (F): 97.3 Height (in): 65 Pulse (bpm): 75 Weight (lbs): 163 Respiratory Rate (breaths/min): 18 Body Mass Index (BMI): 27.1 Blood Pressure (mmHg):  164/83 Reference Range: 80 - 120 mg / dl Electronic Signature(s) Signed: 01/10/2021 4:57:12 PM By: Levan Hurst RN, BSN Entered By: Levan Hurst on 01/10/2021 13:11:58

## 2021-01-17 ENCOUNTER — Encounter (HOSPITAL_BASED_OUTPATIENT_CLINIC_OR_DEPARTMENT_OTHER): Payer: Medicare Other | Admitting: Internal Medicine

## 2021-01-17 NOTE — Progress Notes (Signed)
[name] ([id number]) , Visit Report for 01/15/2021 SuperBill Details Patient Name: Date of Service: [name], [place]. 01/15/2021 Medical Record Number: [id number] Patient Account Number: [id number] Date of Birth/Sex: Treating RN: Nov 24, 1925 (87 y.o. [name], [name] Primary Care Provider: [name] Other Clinician: Referring Provider: Treating Provider/Extender: [name] in Treatment: 31 Diagnosis Coding ICD-10 Codes Code Description C44.92 Squamous cell carcinoma of skin, unspecified L97.812 Non-pressure chronic ulcer of other part of right lower leg with fat layer exposed L97.822 Non-pressure chronic ulcer of other part of left lower leg with fat layer exposed I87.2 Venous insufficiency (chronic) (peripheral) Facility Procedures CPT4 Description Modifier Quantity Code [id number] BILATERAL: Application of multi-layer venous compression system; leg (below knee), including ankle and 1 foot. Electronic Signature(s) Signed: 01/15/2021 1:16:02 PM By: Rhae Hammock RN Signed: 01/17/2021 11:32:23 AM By: Kalman Shan DO Entered By: Rhae Hammock on 01/15/2021 12:06:05

## 2021-01-18 NOTE — Progress Notes (Signed)
[name] ([id number]) , Visit Report for 01/10/2021 Chief Complaint Document Details Patient Name: Date of Service: [name], [place] 01/10/2021 1:15 PM Medical Record Number: [id number] Patient Account Number: [id number] Date of Birth/Sex: Treating RN: 1925-01-29 (87 y.o. [name] Primary Care Provider: [name] Other Clinician: Referring Provider: Treating Provider/Extender: [name] in Treatment: 30 Information Obtained from: Patient Chief Complaint Bilateral lower extremity wounds that have been biopsied and positive for squamous cell carcinoma Electronic Signature(s) Signed: 01/10/2021 1:35:23 PM By: Kalman Shan DO Entered By: Kalman Shan on 01/10/2021 13:32:25 -------------------------------------------------------------------------------- HPI Details Patient Name: Date of Service: [name], [place] 01/10/2021 1:15 PM Medical Record Number: [id number] Patient Account Number: [id number] Date of Birth/Sex: Treating RN: [birth year] (87 y.o. 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 She states that the wound on the right has been present for a year. The 1 on the left has opened up 1 month ago. She is followed with oncology for this issue as she had biopsies that showed squamous cell carcinoma. She is also seeing radiation oncology for treatment options. She presents today because she would like for her wounds to  be healed by [place]. She currently denies signs of infection. 6/1; patient presents for 1 week follow-up. She states she has tolerated the leg wraps well. She states these do not bother her and is happy to continue with them. She is scheduled to see her oncologist today to go over treatment options for the bilateral lower extremity squamous cell carcinoma. Radiation is currently not a recommended option. Patient states she overall feels well. 6/22; patient presents for 3-week follow-up. She has tolerated the wraps well until her last wrap where she states they were uncomfortable. She attributes this to the home health nurse. She denies signs of infection. She has started her first treatment of antibody infusions for her Bilateral lower extremity squamous cell carcinoma. She has no complaints today. 7/21; patient presents for 1 month follow-up. Unfortunately she has not had good experience with her wrap changes with home health. She would like to do her own dressing changes. She continues to do her antibody infusions. She denies signs of infection. 7/28; patient presents for 1 week follow-up. At last clinic visit she was switched to daily dressing changes due to issues with the wrap and home health placing them. Unfortunately she has developed weeping to her legs bilaterally. She would like to be placed in wraps today. She would also like to follow with [place] weekly for wrap changes instead of having home health change them. She denies signs of infection. 8/4; patient presents for 1 week follow-up. She has tolerated the Kerlix/Coban wraps well. She no longer has weeping to her legs. She took the wrap off 1 day before coming in to be able to take a shower. She has no issues or complaints today. She denies signs of infection. 8/18; patient presents for follow-up. Patient has tolerated the wraps well. She brought her Velcro compression wraps today. She has no issues or complaints today. She had her chemotherapy  infusion  yesterday without issues. She denies signs of infection. 8/25; patient presents for follow-up. She used her juxta lite compressions for the past week. It is unclear if she is able to put these on correctly since she states she has a hard time getting them to look right. She reports 2 open wounds. She currently denies signs of infection. 9/1; patient presents for 1 week follow-up. She has 1 open wound. She tolerated the compression wraps well. She currently denies signs of infection. 9/8; patient presents for 1 week follow-up. She has 2 open wounds 1 on each leg. She has tolerated the compression wrap well. She currently denies signs of infection. 9/15; patient presents for 1 week follow-up. She now has 3 wounds. 2 on the left and 1 on the right. She continues to tolerate the compression wrap well. She currently denies signs of infection. She has obtained furosemide by her primary care physician and would like to discuss when to take this. 9/22; patient presents for 1 week follow-up. She has scattered wounds on her lower extremities bilaterally. She did take furosemide twice in the past week. She does not recall having to urinate more frequently. She tolerated the 3 layer compression well. She denies signs of infection. 10/13; patient has not been here recently because of [place]. Apparently the facility was only putting gauze on her legs. This is a patient I do not normally see. 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 Ronnald Ramp at [facility] dermatology. She is quite convincing that she did not have radiation to her lower legs. It is likely she also has significant chronic venous insufficiency stasis dermatitis. We have been using silver alginate under kerlix Coban. She has obvious open areas medially on the right and areas on the left. She also has areas of extensive dry flaking adherent areas on the right and to a  lesser extent on the left anterior. Nodular areas on the left lateral lower leg left posterior calf and right mid calf medially. 10/21; patient presents for follow-up. She has no issues or complaints today. She has tolerated the 3 layer compression well bilaterally. She denies signs of infection. 10/27; patient presents for follow-up. She continues to tolerate the 3 layer compression wrap well. She denies signs of infection. 11/30; patient presents for follow-up. She has no issues or complaints today. 11/10; patient arrived in clinic today for nurse visit accompanied by her daughter from [place]. The daughter had multiple questions so we turned this into doctors visit. Apparently after the last time I saw this woman in October she went to see Dr. Ronnald Ramp but he did not take the wraps off. She previously was treated with Cemiplimab for her squamous cell carcinoma. She did not receive radiation to her legs. I had wanted Dr. Ronnald Ramp to look at this because of the exceptionally damaged skin on her lower legs bilaterally. She has odd looking wounds on the left medial lower leg also extending posteriorly which we have not made a lot of progress on. But she also has a raised hyperkeratotic nodules on the right anterior tibial area plaques on the left medial lower thigh. These are not areas that are under compression. We have been using silver alginate on any open areas Liberal TCA under 3 layer compression. 11/17; patient presents for follow-up. She had her wraps taken off yesterday for her dermatology appointment. She reports excessive weeping to her legs bilaterally after the wraps were  taken off. She currently denies signs of infection. 12/12/2020 upon evaluation today patient appears to be doing about the same in regard to her wounds. She was actually started on Cipro after having biopsies apparently at her dermatology clinic she does not have the results back from the actual biopsy but the culture did return  and apparently they placed her on the Cipro she started that just this morning. Other than that her legs appear to be doing okay at this time. 12/1; patient presents for follow-up. She has no issues or complaints today. She has started Lasix 40 mg daily to help with her bilateral leg swelling. 12/8; patient presents for follow-up. She has no issues or complaints today. 12/15; patient presents for 1 week follow-up. She has no issues or complaints today. 12/22; patient presents for follow-up. She has no issues or complaints today. Electronic Signature(s) Signed: 01/10/2021 1:35:23 PM By: Kalman Shan DO Entered By: Kalman Shan on 01/10/2021 13:32:53 -------------------------------------------------------------------------------- Physical Exam Details Patient Name: Date of Service: [name], [place] 01/10/2021 1:15 PM Medical Record Number: [id number] Patient Account Number: [id number] Date of Birth/Sex: Treating RN: 07/09/1925 (87 y.o. [name] Primary Care Provider: [name] Other Clinician: Referring Provider: Treating Provider/Extender: [name] in Treatment: 30 Constitutional respirations regular, non-labored and within target range for patient.. Cardiovascular 2+ dorsalis pedis/posterior tibialis pulses. Psychiatric pleasant and cooperative. Notes Right lower extremity: Small open wound limited to skin breakdown. Left lower extremity: Scattered small wounds to the posterior aspect limited to skin breakdown. Good edema control bilaterally Electronic Signature(s) Signed: 01/10/2021 1:35:23 PM By: Kalman Shan DO Entered By: Kalman Shan on 01/10/2021 13:33:35 -------------------------------------------------------------------------------- Physician Orders Details Patient Name: Date of Service: [name], [place] 01/10/2021 1:15 PM Medical Record Number: [id number] Patient Account Number: [id number] Date of  Birth/Sex: Treating RN: 05-27-1925 (87 y.o. [name] Primary Care Provider: [name] Other Clinician: Referring Provider: Treating Provider/Extender: [name] in Treatment: 30 Verbal / Phone Orders: No Diagnosis Coding ICD-10 Coding Code Description C44.92 Squamous cell carcinoma of skin, unspecified L97.812 Non-pressure chronic ulcer of other part of right lower leg with fat layer exposed L97.822 Non-pressure chronic ulcer of other part of left lower leg with fat layer exposed I87.2 Venous insufficiency (chronic) (peripheral) Follow-up Appointments ppointment in 2 weeks. - Dr. Heber [place] Return A Nurse Visit: - Tuesday for rewrap Bathing/ Shower/ Hygiene May shower with protection but do not get wound dressing(s) wet. - Use cast protector Edema Control - Lymphedema / SCD / Other Elevate legs to the level of the heart or above for 30 minutes daily and/or when sitting, a frequency of: - elevate the legs throughout the day heart level if possible. Avoid standing for long periods of time. Exercise regularly Additional Orders / Instructions Follow Nutritious Diet Wound Treatment Wound #15 - Lower Leg Wound Laterality: Right, Posterior Cleanser: Soap and Water 1 x Per Week/15 Days Discharge Instructions: May shower and wash wound with dial antibacterial soap and water prior to dressing change. Cleanser: Wound Cleanser 1 x Per Week/15 Days Discharge Instructions: Cleanse the wound with wound cleanser prior to applying a clean dressing using gauze sponges, not tissue or cotton balls. Peri-Wound Care: Triamcinolone 15 (g) 1 x Per Week/15 Days Discharge Instructions: In clinic only. Use triamcinolone 15 (g) in clinic only. Peri-Wound Care: Sween Lotion (Moisturizing lotion) 1 x Per Week/15 Days Discharge Instructions: Apply moisturizing lotion as directed Prim Dressing: KerraCel Ag Gelling Fiber Dressing,  4x5 in (silver alginate) 1 x  Per Week/15 Days ary Discharge Instructions: Apply silver alginate to wound bed as instructed Secondary Dressing: Woven Gauze Sponge, Non-Sterile 4x4 in 1 x Per Week/15 Days Discharge Instructions: Apply over primary dressing as directed. Secondary Dressing: ABD Pad, 8x10 1 x Per Week/15 Days Discharge Instructions: Apply over primary dressing as directed. Compression Wrap: ThreePress (3 layer compression wrap) 1 x Per Week/15 Days Discharge Instructions: Apply three layer compression as directed. Wound #7R - Lower Leg Wound Laterality: Left, Circumferential Cleanser: Soap and Water 1 x Per Week/15 Days Discharge Instructions: May shower and wash wound with dial antibacterial soap and water prior to dressing change. Cleanser: Wound Cleanser 1 x Per Week/15 Days Discharge Instructions: Cleanse the wound with wound cleanser prior to applying a clean dressing using gauze sponges, not tissue or cotton balls. Peri-Wound Care: Triamcinolone 15 (g) 1 x Per Week/15 Days Discharge Instructions: In clinic only. Use triamcinolone 15 (g) in clinic only. Peri-Wound Care: Sween Lotion (Moisturizing lotion) 1 x Per Week/15 Days Discharge Instructions: Apply moisturizing lotion as directed Prim Dressing: KerraCel Ag Gelling Fiber Dressing, 4x5 in (silver alginate) 1 x Per Week/15 Days ary Discharge Instructions: Apply silver alginate to wound bed as instructed Secondary Dressing: Woven Gauze Sponge, Non-Sterile 4x4 in 1 x Per Week/15 Days Discharge Instructions: Apply over primary dressing as directed. Secondary Dressing: ABD Pad, 8x10 1 x Per Week/15 Days Discharge Instructions: Apply over primary dressing as directed. Compression Wrap: ThreePress (3 layer compression wrap) 1 x Per Week/15 Days Discharge Instructions: Apply three layer compression as directed. Electronic Signature(s) Signed: 01/10/2021 1:35:23 PM By: Kalman Shan DO Entered By: Kalman Shan on 01/10/2021  13:33:58 -------------------------------------------------------------------------------- Problem List Details Patient Name: Date of Service: [name], [place] 01/10/2021 1:15 PM Medical Record Number: [id number] Patient Account Number: [id number] Date of Birth/Sex: Treating RN: 02-07-1925 (87 y.o. [name] Primary Care Provider: [name] Other Clinician: Referring Provider: Treating Provider/Extender: [name] in Treatment: 30 Active Problems ICD-10 Encounter Code Description Active Date MDM Diagnosis C44.92 Squamous cell carcinoma of skin, unspecified 06/11/2020 No Yes L97.812 Non-pressure chronic ulcer of other part of right lower leg with fat layer 06/11/2020 No Yes exposed L97.822 Non-pressure chronic ulcer of other part of left lower leg with fat layer exposed5/23/2022 No Yes I87.2 Venous insufficiency (chronic) (peripheral) 06/11/2020 No Yes Inactive Problems Resolved Problems Electronic Signature(s) Signed: 01/10/2021 1:35:23 PM By: Kalman Shan DO Entered By: Kalman Shan on 01/10/2021 13:31:41 -------------------------------------------------------------------------------- Progress Note Details Patient Name: Date of Service: [name], [place]. 01/10/2021 1:15 PM Medical Record Number: [id number] Patient Account Number: [id number] Date of Birth/Sex: Treating RN: 17-Dec-1925 (87 y.o. 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 She states that the wound on the right has been present for a year. The 1 on the left has opened up 1 month ago. She is followed with oncology  for this issue as she had biopsies that showed squamous cell carcinoma. She is also seeing radiation oncology for treatment options. She presents today because she would like for her wounds to be healed by [place]. She currently denies signs of infection. 6/1; patient presents for 1 week follow-up. She states she has tolerated the leg wraps well. She states these do not bother her and is happy to continue with them. She is scheduled to see her oncologist today to go over treatment options for the bilateral lower extremity squamous cell carcinoma. Radiation is currently not a recommended option. Patient states she overall feels well. 6/22; patient presents for 3-week follow-up. She has tolerated the wraps well until her last wrap where she states they were uncomfortable. She attributes this to the home health nurse. She denies signs of infection. She has started her first treatment of antibody infusions for her Bilateral lower extremity squamous cell carcinoma. She has no complaints today. 7/21; patient presents for 1 month follow-up. Unfortunately she has not had good experience with her wrap changes with home health. She would like to do her own dressing changes. She continues to do her antibody infusions. She denies signs of infection. 7/28; patient presents for 1 week follow-up. At last clinic visit she was switched to daily dressing changes due to issues with the wrap and home health placing them. Unfortunately she has developed weeping to her legs bilaterally. She would like to be placed in wraps today. She would also like to follow  with [place] weekly for wrap changes instead of having home health change them. She denies signs of infection. 8/4; patient presents for 1 week follow-up. She has tolerated the Kerlix/Coban wraps well. She no longer has weeping to her legs. She took the wrap off 1 day before coming in to be able to take a shower. She has no issues or complaints today. She denies signs of infection. 8/18; patient presents for follow-up. Patient has tolerated the wraps well. She brought her Velcro compression wraps today. She has no issues or complaints today. She had her chemotherapy infusion yesterday without issues. She denies signs of infection. 8/25; patient presents for follow-up. She used her juxta lite compressions for the past week. It is unclear if she is able to put these on correctly since she states she has a hard time getting them to look right. She reports 2 open wounds. She currently denies signs of infection. 9/1; patient presents for 1 week follow-up. She has 1 open wound. She tolerated the compression wraps well. She currently denies signs of infection. 9/8; patient presents for 1 week follow-up. She has 2 open wounds 1 on each leg. She has tolerated the compression wrap well. She currently denies signs of infection. 9/15; patient presents for 1 week follow-up. She now has 3 wounds. 2 on the left and 1 on the right. She continues to tolerate the compression wrap well. She currently denies signs of infection. She has obtained furosemide by her primary care physician and would like to discuss when to take this. 9/22; patient presents for 1 week follow-up. She has scattered wounds on her lower extremities bilaterally. She did take furosemide twice in the past week. She does not recall having to urinate more frequently. She tolerated the 3 layer compression well. She denies signs of infection. 10/13; patient has not been here recently because of [place]. Apparently the facility was only putting gauze on her  legs. This is a patient I do not normally see. She  has a history of squamous cell carcinoma bilaterally on her anterior lower legs followed for a period of time by Dr. Ronnald Ramp at [facility] dermatology. She is quite convincing that she did not have radiation to her lower legs. It is likely she also has significant chronic venous insufficiency stasis dermatitis. We have been using silver alginate under kerlix Coban. She has obvious open areas medially on the right and areas on the left. She also has areas of extensive dry flaking adherent areas on the right and to a lesser extent on the left anterior. Nodular areas on the left lateral lower leg left posterior calf and right mid calf medially. 10/21; patient presents for follow-up. She has no issues or complaints today. She has tolerated the 3 layer compression well bilaterally. She denies signs of infection. 10/27; patient presents for follow-up. She continues to tolerate the 3 layer compression wrap well. She denies signs of infection. 11/30; patient presents for follow-up. She has no issues or complaints today. 11/10; patient arrived in clinic today for nurse visit accompanied by her daughter from [place]. The daughter had multiple questions so we turned this into doctors visit. Apparently after the last time I saw this woman in October she went to see Dr. Ronnald Ramp but he did not take the wraps off. She previously was treated with Cemiplimab for her squamous cell carcinoma. She did not receive radiation to her legs. I had wanted Dr. Ronnald Ramp to look at this because of the exceptionally damaged skin on her lower legs bilaterally. She has odd looking wounds on the left medial lower leg also extending posteriorly which we have not made a lot of progress on. But she also has a raised hyperkeratotic nodules on the right anterior tibial area plaques on the left medial lower thigh. These are not areas that are under compression. We have been using silver alginate on  any open areas Liberal TCA under 3 layer compression. 11/17; patient presents for follow-up. She had her wraps taken off yesterday for her dermatology appointment. She reports excessive weeping to her legs bilaterally after the wraps were taken off. She currently denies signs of infection. 12/12/2020 upon evaluation today patient appears to be doing about the same in regard to her wounds. She was actually started on Cipro after having biopsies apparently at her dermatology clinic she does not have the results back from the actual biopsy but the culture did return and apparently they placed her on the Cipro she started that just this morning. Other than that her legs appear to be doing okay at this time. 12/1; patient presents for follow-up. She has no issues or complaints today. She has started Lasix 40 mg daily to help with her bilateral leg swelling. 12/8; patient presents for follow-up. She has no issues or complaints today. 12/15; patient presents for 1 week follow-up. She has no issues or complaints today. 12/22; patient presents for follow-up. She has no issues or complaints today. Patient History Information obtained from Patient. Family History Unknown History. Social History Never smoker, Marital Status - Single, Alcohol Use - Never, Drug Use - No History, Caffeine Use - Never. Medical History Eyes Denies history of Cataracts, Optic Neuritis Ear/Nose/Mouth/Throat Denies history of Chronic sinus problems/congestion, Middle ear problems Hematologic/Lymphatic Patient has history of Anemia Denies history of Hemophilia, Human Immunodeficiency Virus, Lymphedema, Sickle Cell Disease Respiratory Denies history of Aspiration, Asthma, Chronic Obstructive Pulmonary Disease (COPD), Pneumothorax, Sleep Apnea, Tuberculosis Cardiovascular Patient has history of Arrhythmia - Atrial Flutter, A fibb, Congestive Heart  Failure, Hypertension Denies history of Angina, Coronary Artery Disease, Deep  Vein Thrombosis, Hypotension, Myocardial Infarction, Peripheral Arterial Disease, Peripheral Venous Disease, Phlebitis, Vasculitis Gastrointestinal Patient has history of Colitis Denies history of Cirrhosis , Crohnoos, Hepatitis A, Hepatitis B, Hepatitis C Endocrine Denies history of Type I Diabetes, Type II Diabetes Genitourinary Denies history of End Stage Renal Disease Immunological Denies history of Lupus Erythematosus, Raynaudoos, Scleroderma Integumentary (Skin) Denies history of History of Burn Musculoskeletal Patient has history of Osteoarthritis Denies history of Gout, Rheumatoid Arthritis, Osteomyelitis Neurologic Denies history of Dementia, Neuropathy, Quadriplegia, Paraplegia, Seizure Disorder Oncologic Denies history of Received Chemotherapy, Received Radiation Hospitalization/Surgery History - removal of rod left leg. 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 Vitals Time Taken: 1:11 PM, Height: 65 in, Weight: 163 lbs, BMI: 27.1, Temperature: 97.3 F, Pulse: 75 bpm, Respiratory Rate: 18 breaths/min, Blood Pressure: 164/83 mmHg. Cardiovascular 2+ dorsalis pedis/posterior tibialis pulses. Psychiatric pleasant and cooperative. General Notes: Right lower extremity: Small open wound limited to skin breakdown. Left lower extremity: Scattered small wounds to the posterior aspect limited to skin breakdown. Good edema control bilaterally Integumentary (Hair, Skin) Wound #15 status is Open. Original cause of wound was Gradually Appeared. The date acquired was: 11/08/2020. The wound has been in treatment 9 weeks. The wound is located on the Right,Posterior Lower Leg. The wound measures 0.5cm length x 0.3cm width x 0.1cm depth; 0.118cm^2 area and  0.012cm^3 volume. There is Fat Layer (Subcutaneous Tissue) exposed. There is no tunneling or undermining noted. There is a medium amount of serosanguineous drainage noted. The wound margin is distinct with the outline attached to the wound base. There is large (67-100%) pink granulation within the wound bed. There is no necrotic tissue within the wound bed. Wound #7R status is Open. Original cause of wound was Blister. The date acquired was: 04/20/2020. The wound has been in treatment 30 weeks. The wound is located on the Left,Circumferential Lower Leg. The wound measures 6.1cm length x 11.2cm width x 0.1cm depth; 53.658cm^2 area and 5.366cm^3 volume. There is Fat Layer (Subcutaneous Tissue) exposed. There is no tunneling or undermining noted. There is a medium amount of serosanguineous drainage noted. The wound margin is distinct with the outline attached to the wound base. There is large (67-100%) red, pink granulation within the wound bed. There is no necrotic tissue within the wound bed. Assessment Active Problems ICD-10 Squamous cell carcinoma of skin, unspecified Non-pressure chronic ulcer of other part of right lower leg with fat layer exposed Non-pressure chronic ulcer of other part of left lower leg with fat layer exposed Venous insufficiency (chronic) (peripheral) Patient's wounds have shown improvement in size in appearance since last clinic visit. No signs of infection on exam. She only has a few scattered wounds remaining. I recommended continuing silver alginate under Kerlix/Coban. Procedures Wound #15 Pre-procedure diagnosis of Wound #15 is a Venous Leg Ulcer located on the Right,Posterior Lower Leg . There was a Three Layer Compression Therapy Procedure by Levan Hurst, RN. Post procedure Diagnosis Wound #15: Same as Pre-Procedure Wound #7R Pre-procedure diagnosis of Wound #7R is a Malignant Wound located on the Left,Circumferential Lower Leg . There was a Three Layer  Compression Therapy Procedure by Levan Hurst, RN. Post procedure Diagnosis Wound #7R: Same as Pre-Procedure Plan Follow-up Appointments: Return Appointment in 2 weeks. - Dr. Heber [name] Nurse Visit: - Tuesday for rewrap Bathing/ Shower/ Hygiene:  May shower with protection but do not get wound dressing(s) wet. - Use cast protector Edema Control - Lymphedema / SCD / Other: Elevate legs to the level of the heart or above for 30 minutes daily and/or when sitting, a frequency of: - elevate the legs throughout the day heart level if possible. Avoid standing for long periods of time. Exercise regularly Additional Orders / Instructions: Follow Nutritious Diet WOUND #15: - Lower Leg Wound Laterality: Right, Posterior Cleanser: Soap and Water 1 x Per Week/15 Days Discharge Instructions: May shower and wash wound with dial antibacterial soap and water prior to dressing change. Cleanser: Wound Cleanser 1 x Per Week/15 Days Discharge Instructions: Cleanse the wound with wound cleanser prior to applying a clean dressing using gauze sponges, not tissue or cotton balls. Peri-Wound Care: Triamcinolone 15 (g) 1 x Per Week/15 Days Discharge Instructions: In clinic only. Use triamcinolone 15 (g) in clinic only. Peri-Wound Care: Sween Lotion (Moisturizing lotion) 1 x Per Week/15 Days Discharge Instructions: Apply moisturizing lotion as directed Prim Dressing: KerraCel Ag Gelling Fiber Dressing, 4x5 in (silver alginate) 1 x Per Week/15 Days ary Discharge Instructions: Apply silver alginate to wound bed as instructed Secondary Dressing: Woven Gauze Sponge, Non-Sterile 4x4 in 1 x Per Week/15 Days Discharge Instructions: Apply over primary dressing as directed. Secondary Dressing: ABD Pad, 8x10 1 x Per Week/15 Days Discharge Instructions: Apply over primary dressing as directed. Com pression Wrap: ThreePress (3 layer compression wrap) 1 x Per Week/15 Days Discharge Instructions: Apply three layer compression  as directed. WOUND #7R: - Lower Leg Wound Laterality: Left, Circumferential Cleanser: Soap and Water 1 x Per Week/15 Days Discharge Instructions: May shower and wash wound with dial antibacterial soap and water prior to dressing change. Cleanser: Wound Cleanser 1 x Per Week/15 Days Discharge Instructions: Cleanse the wound with wound cleanser prior to applying a clean dressing using gauze sponges, not tissue or cotton balls. Peri-Wound Care: Triamcinolone 15 (g) 1 x Per Week/15 Days Discharge Instructions: In clinic only. Use triamcinolone 15 (g) in clinic only. Peri-Wound Care: Sween Lotion (Moisturizing lotion) 1 x Per Week/15 Days Discharge Instructions: Apply moisturizing lotion as directed Prim Dressing: KerraCel Ag Gelling Fiber Dressing, 4x5 in (silver alginate) 1 x Per Week/15 Days ary Discharge Instructions: Apply silver alginate to wound bed as instructed Secondary Dressing: Woven Gauze Sponge, Non-Sterile 4x4 in 1 x Per Week/15 Days Discharge Instructions: Apply over primary dressing as directed. Secondary Dressing: ABD Pad, 8x10 1 x Per Week/15 Days Discharge Instructions: Apply over primary dressing as directed. Com pression Wrap: ThreePress (3 layer compression wrap) 1 x Per Week/15 Days Discharge Instructions: Apply three layer compression as directed. 1. Silver alginate under Kerlix/Coban 2. Follow-up next week for nurse visit and in 2 weeks with me Electronic Signature(s) Signed: 01/10/2021 1:35:23 PM By: Kalman Shan DO Entered By: Kalman Shan on 01/10/2021 13:34:49 -------------------------------------------------------------------------------- HxROS Details Patient Name: Date of Service: [name], [place]. 01/10/2021 1:15 PM Medical Record Number: [id number] Patient Account Number: [id number] Date of Birth/Sex: Treating RN: [birth year] (87 y.o. [name] Primary Care Provider: [name] Other Clinician: Referring Provider: Treating  Provider/Extender: [name] in Treatment: 30 Information Obtained From Patient Eyes Medical History: Negative for: Cataracts; Optic Neuritis Ear/Nose/Mouth/Throat Medical History: Negative for: Chronic sinus problems/congestion; Middle ear problems Hematologic/Lymphatic Medical History: Positive for: Anemia Negative for: Hemophilia; Human Immunodeficiency Virus; Lymphedema; Sickle Cell Disease Respiratory Medical History: Negative for: Aspiration; Asthma; Chronic Obstructive Pulmonary Disease (COPD); Pneumothorax; Sleep Apnea;  Tuberculosis Cardiovascular Medical History: Positive for: Arrhythmia - Atrial Flutter, A fibb; Congestive Heart Failure; Hypertension Negative for: Angina; Coronary Artery Disease; Deep Vein Thrombosis; Hypotension; Myocardial Infarction; Peripheral Arterial Disease; Peripheral Venous Disease; Phlebitis; Vasculitis Past Medical History Notes: hyperlipidemia Gastrointestinal Medical History: Positive for: Colitis Negative for: Cirrhosis ; Crohns; Hepatitis A; Hepatitis B; Hepatitis C Endocrine Medical History: Negative for: Type I Diabetes; Type II Diabetes Past Medical History Notes: hypothyroidism Genitourinary Medical History: Negative for: End Stage Renal Disease Immunological Medical History: Negative for: Lupus Erythematosus; Raynauds; Scleroderma Integumentary (Skin) Medical History: Negative for: History of Burn Musculoskeletal Medical History: Positive for: Osteoarthritis Negative for: Gout; Rheumatoid Arthritis; Osteomyelitis Neurologic Medical History: Negative for: Dementia; Neuropathy; Quadriplegia; Paraplegia; Seizure Disorder Past Medical History Notes: lumbar spindylolysis Oncologic Medical History: Negative for: Received Chemotherapy; Received Radiation Past Medical History Notes: BLE squamous ceel carcionoma Immunizations Pneumococcal Vaccine: Received Pneumococcal Vaccination:  Yes Received Pneumococcal Vaccination On or After 60th Birthday: No Implantable Devices None Hospitalization / Surgery History Type of Hospitalization/Surgery removal of rod left leg Family and Social History Unknown History: Yes; Never smoker; Marital Status - Single; Alcohol Use: Never; Drug Use: No History; Caffeine Use: Never; Financial Concerns: No; Food, Clothing or Shelter Needs: No; Support System Lacking: No; Transportation Concerns: No Electronic Signature(s) Signed: 01/10/2021 1:35:23 PM By: Kalman Shan DO Signed: 01/10/2021 4:57:12 PM By: Levan Hurst RN, BSN Entered By: Kalman Shan on 01/10/2021 13:33:02 -------------------------------------------------------------------------------- SuperBill Details Patient Name: Date of Service: [name], [place]. 01/10/2021 Medical Record Number: [id number] Patient Account Number: [id number] Date of Birth/Sex: Treating RN: Apr 01, 1925 (87 y.o. [name] Primary Care Provider: [name] Other Clinician: Referring Provider: Treating Provider/Extender: [name] in Treatment: 30 Diagnosis Coding ICD-10 Codes Code Description C44.92 Squamous cell carcinoma of skin, unspecified L97.812 Non-pressure chronic ulcer of other part of right lower leg with fat layer exposed L97.822 Non-pressure chronic ulcer of other part of left lower leg with fat layer exposed I87.2 Venous insufficiency (chronic) (peripheral) Facility Procedures CPT4: Code [id number] Description: 81 BILATERAL: Application of multi-layer venous compression system; leg (below knee), including ankle and t. Modifier: Quantity: 1 Physician Procedures : CPT4 Code Description Modifier [id number] - WC PHYS LEVEL 3 - EST PT ICD-10 Diagnosis Description C44.92 Squamous cell carcinoma of skin, unspecified L97.812 Non-pressure chronic ulcer of other part of right lower leg with fat layer exposed L97.822   Non-pressure chronic ulcer of other part of left lower leg with fat layer exposed I87.2 Venous insufficiency (chronic) (peripheral) Quantity: 1 Electronic Signature(s) Signed: 01/10/2021 4:57:12 PM By: Levan Hurst RN, BSN Signed: 01/18/2021 8:18:59 AM By: Kalman Shan DO Previous Signature: 01/10/2021 1:35:23 PM Version By: Kalman Shan DO Entered By: Levan Hurst on 01/10/2021 16:49:42

## 2021-01-24 ENCOUNTER — Encounter (HOSPITAL_BASED_OUTPATIENT_CLINIC_OR_DEPARTMENT_OTHER): Payer: Medicare Other | Attending: Internal Medicine | Admitting: Internal Medicine

## 2021-01-24 ENCOUNTER — Other Ambulatory Visit: Payer: Self-pay

## 2021-01-24 DIAGNOSIS — L97822 Non-pressure chronic ulcer of other part of left lower leg with fat layer exposed: Secondary | ICD-10-CM | POA: Insufficient documentation

## 2021-01-24 DIAGNOSIS — I872 Venous insufficiency (chronic) (peripheral): Secondary | ICD-10-CM | POA: Insufficient documentation

## 2021-01-24 DIAGNOSIS — Z85828 Personal history of other malignant neoplasm of skin: Secondary | ICD-10-CM | POA: Insufficient documentation

## 2021-01-24 DIAGNOSIS — I890 Lymphedema, not elsewhere classified: Secondary | ICD-10-CM | POA: Diagnosis not present

## 2021-01-24 DIAGNOSIS — L97812 Non-pressure chronic ulcer of other part of right lower leg with fat layer exposed: Secondary | ICD-10-CM | POA: Diagnosis not present

## 2021-01-24 DIAGNOSIS — C4492 Squamous cell carcinoma of skin, unspecified: Secondary | ICD-10-CM | POA: Diagnosis not present

## 2021-01-24 NOTE — Progress Notes (Signed)
[name] ([id number]) , Visit Report for 01/24/2021 Chief Complaint Document Details Patient Name: Date of Service: [name], [name] 01/24/2021 1:15 PM Medical Record Number: [id number] Patient Account Number: [id number] Date of Birth/Sex: Treating RN: 30-Nov-1925 (88 y.o. [name] Primary Care Provider: [name] Other Clinician: Referring Provider: Treating Provider/Extender: [name] in Treatment: 4 Information Obtained from: Patient Chief Complaint Bilateral lower extremity wounds that have been biopsied and positive for squamous cell carcinoma Electronic Signature(s) Signed: 01/24/2021 2:36:38 PM By: Kalman Shan DO Entered By: Kalman Shan on 01/24/2021 14:32:56 -------------------------------------------------------------------------------- HPI Details Patient Name: Date of Service: [name], [name] 01/24/2021 1:15 PM Medical Record Number: [id number] Patient Account Number: [id number] Date of Birth/Sex: Treating RN: 1925-03-18 (88 y.o. [name] Primary Care Provider: [name] Other Clinician: Referring Provider: Treating Provider/Extender: [name] in Treatment: 32 History of Present Illness Location: left leg HPI Description: Admission 5/23 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 She states that the wound on the right has been present for a year. The 1 on the left has opened up 1 month ago. She is followed with oncology for this issue as she had biopsies that showed squamous cell carcinoma. She is also seeing radiation oncology for treatment options. She presents today because she would like for her wounds to be healed by  [place]. She currently denies signs of infection. 6/1; patient presents for 1 week follow-up. She states she has tolerated the leg wraps well. She states these do not bother her and is happy to continue with them. She is scheduled to see her oncologist today to go over treatment options for the bilateral lower extremity squamous cell carcinoma. Radiation is currently not a recommended option. Patient states she overall feels well. 6/22; patient presents for 3-week follow-up. She has tolerated the wraps well until her last wrap where she states they were uncomfortable. She attributes this to the home health nurse. She denies signs of infection. She has started her first treatment of antibody infusions for her Bilateral lower extremity squamous cell carcinoma. She has no complaints today. 7/21; patient presents for 1 month follow-up. Unfortunately she has not had good experience with her wrap changes with home health. She would like to do her own dressing changes. She continues to do her antibody infusions. She denies signs of infection. 7/28; patient presents for 1 week follow-up. At last clinic visit she was switched to daily dressing changes due to issues with the wrap and home health placing them. Unfortunately she has developed weeping to her legs bilaterally. She would like to be placed in wraps today. She would also like to follow with [place] weekly for wrap changes instead of having home health change them. She denies signs of infection. 8/4; patient presents for 1 week follow-up. She has tolerated the Kerlix/Coban wraps well. She no longer has weeping to her legs. She took the wrap off 1 day before coming in to be able to take a shower. She has no issues or complaints today. She denies signs of infection. 8/18; patient presents for follow-up. Patient has tolerated the wraps well. She brought her Velcro compression wraps today. She has no issues or complaints today. She had her chemotherapy infusion  yesterday without issues. She denies signs of infection. 8/25; patient presents for follow-up. She used her juxta lite compressions for the past week. It is unclear if she is able to put these on correctly since she states she has a hard time getting them to look right. She reports 2 open wounds. She currently denies signs of infection. 9/1; patient presents for 1 week follow-up. She has 1 open wound. She tolerated the compression wraps well. She currently denies signs of infection. 9/8; patient presents for 1 week follow-up. She has 2 open wounds 1 on each leg. She has tolerated the compression wrap well. She currently denies signs of infection. 9/15; patient presents for 1 week follow-up. She now has 3 wounds. 2 on the left and 1 on the right. She continues to tolerate the compression wrap well. She currently denies signs of infection. She has obtained furosemide by her primary care physician and would like to discuss when to take this. 9/22; patient presents for 1 week follow-up. She has scattered wounds on her lower extremities bilaterally. She did take furosemide twice in the past week. She does not recall having to urinate more frequently. She tolerated the 3 layer compression well. She denies signs of infection. 10/13; patient has not been here recently because of [place]. Apparently the facility was only putting gauze on her legs. This is a patient I do not normally see. 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 Ronnald Ramp at [facility] dermatology. She is quite convincing that she did not have radiation to her lower legs. It is likely she also has significant chronic venous insufficiency stasis dermatitis. We have been using silver alginate under kerlix Coban. She has obvious open areas medially on the right and areas on the left. She also has areas of extensive dry flaking adherent areas on the right and to a lesser  extent on the left anterior. Nodular areas on the left lateral lower leg left posterior calf and right mid calf medially. 10/21; patient presents for follow-up. She has no issues or complaints today. She has tolerated the 3 layer compression well bilaterally. She denies signs of infection. 10/27; patient presents for follow-up. She continues to tolerate the 3 layer compression wrap well. She denies signs of infection. 11/30; patient presents for follow-up. She has no issues or complaints today. 11/10; patient arrived in clinic today for nurse visit accompanied by her daughter from [place]. The daughter had multiple questions so we turned this into doctors visit. Apparently after the last time I saw this woman in October she went to see Dr. Ronnald Ramp but he did not take the wraps off. She previously was treated with Cemiplimab for her squamous cell carcinoma. She did not receive radiation to her legs. I had wanted Dr. Ronnald Ramp to look at this because of the exceptionally damaged skin on her lower legs bilaterally. She has odd looking wounds on the left medial lower leg also extending posteriorly which we have not made a lot of progress on. But she also has a raised hyperkeratotic nodules on the right anterior tibial area plaques on the left medial lower thigh. These are not areas that are under compression. We have been using silver alginate on any open areas Liberal TCA under 3 layer compression. 11/17; patient presents for follow-up. She had her wraps taken off yesterday for her dermatology appointment. She reports excessive weeping to her legs bilaterally after the wraps were  taken off. She currently denies signs of infection. 12/12/2020 upon evaluation today patient appears to be doing about the same in regard to her wounds. She was actually started on Cipro after having biopsies apparently at her dermatology clinic she does not have the results back from the actual biopsy but the culture did return and  apparently they placed her on the Cipro she started that just this morning. Other than that her legs appear to be doing okay at this time. 12/1; patient presents for follow-up. She has no issues or complaints today. She has started Lasix 40 mg daily to help with her bilateral leg swelling. 12/8; patient presents for follow-up. She has no issues or complaints today. 12/15; patient presents for 1 week follow-up. She has no issues or complaints today. 12/22; patient presents for follow-up. She has no issues or complaints today. 1/5; patient presents for follow-up. She has no issues or complaints today. She has tolerated the compression wraps well. Electronic Signature(s) Signed: 01/24/2021 2:36:38 PM By: Kalman Shan DO Entered By: Kalman Shan on 01/24/2021 14:33:19 -------------------------------------------------------------------------------- Physical Exam Details Patient Name: Date of Service: [name], [place]. 01/24/2021 1:15 PM Medical Record Number: [id number] Patient Account Number: [id number] Date of Birth/Sex: Treating RN: 05-Aug-1925 (88 y.o. [name] Primary Care Provider: [name] Other Clinician: Referring Provider: Treating Provider/Extender: [name] in Treatment: 32 Constitutional respirations regular, non-labored and within target range for patient.. Cardiovascular 2+ dorsalis pedis/posterior tibialis pulses. Psychiatric pleasant and cooperative. Notes Right lower extremity: Small open wound limited to skin breakdown. Left lower extremity: Scattered small wounds to the posterior aspect limited to skin breakdown. Good edema control bilaterally Electronic Signature(s) Signed: 01/24/2021 2:36:38 PM By: Kalman Shan DO Entered By: Kalman Shan on 01/24/2021 14:34:33 -------------------------------------------------------------------------------- Physician Orders Details Patient Name: Date of Service: KIV  ETT, [place]. 01/24/2021 1:15 PM Medical Record Number: [id number] Patient Account Number: [id number] Date of Birth/Sex: Treating RN: 05-25-1925 (88 y.o. [name] Primary Care Provider: [name] Other Clinician: Referring Provider: Treating Provider/Extender: [name] in Treatment: 82 Verbal / Phone Orders: No Diagnosis Coding ICD-10 Coding Code Description C44.92 Squamous cell carcinoma of skin, unspecified L97.812 Non-pressure chronic ulcer of other part of right lower leg with fat layer exposed L97.822 Non-pressure chronic ulcer of other part of left lower leg with fat layer exposed I87.2 Venous insufficiency (chronic) (peripheral) Follow-up Appointments ppointment in 1 week. - Dr. Heber [place] Return A Bathing/ Shower/ Hygiene May shower with protection but do not get wound dressing(s) wet. - Use cast protector Edema Control - Lymphedema / SCD / Other Elevate legs to the level of the heart or above for 30 minutes daily and/or when sitting, a frequency of: - elevate the legs throughout the day heart level if possible. Avoid standing for long periods of time. Exercise regularly Additional Orders / Instructions Follow Nutritious Diet Wound Treatment Wound #15 - Lower Leg Wound Laterality: Right, Posterior Cleanser: Soap and Water 1 x Per Week/15 Days Discharge Instructions: May shower and wash wound with dial antibacterial soap and water prior to dressing change. Cleanser: Wound Cleanser 1 x Per Week/15 Days Discharge Instructions: Cleanse the wound with wound cleanser prior to applying a clean dressing using gauze sponges, not tissue or cotton balls. Peri-Wound Care: Triamcinolone 15 (g) 1 x Per Week/15 Days Discharge Instructions: In clinic only. Use triamcinolone 15 (g) in clinic only. Peri-Wound Care: Sween Lotion (Moisturizing lotion) 1 x Per Week/15 Days Discharge  Instructions: Apply moisturizing lotion as directed Prim  Dressing: KerraCel Ag Gelling Fiber Dressing, 4x5 in (silver alginate) 1 x Per Week/15 Days ary Discharge Instructions: Apply silver alginate to wound bed as instructed Secondary Dressing: Woven Gauze Sponge, Non-Sterile 4x4 in 1 x Per Week/15 Days Discharge Instructions: Apply over primary dressing as directed. Secondary Dressing: ABD Pad, 8x10 1 x Per Week/15 Days Discharge Instructions: Apply over primary dressing as directed. Compression Wrap: ThreePress (3 layer compression wrap) 1 x Per Week/15 Days Discharge Instructions: Apply three layer compression as directed. Wound #7R - Lower Leg Wound Laterality: Left, Circumferential Cleanser: Soap and Water 1 x Per Week/15 Days Discharge Instructions: May shower and wash wound with dial antibacterial soap and water prior to dressing change. Cleanser: Wound Cleanser 1 x Per Week/15 Days Discharge Instructions: Cleanse the wound with wound cleanser prior to applying a clean dressing using gauze sponges, not tissue or cotton balls. Peri-Wound Care: Triamcinolone 15 (g) 1 x Per Week/15 Days Discharge Instructions: In clinic only. Use triamcinolone 15 (g) in clinic only. Peri-Wound Care: Sween Lotion (Moisturizing lotion) 1 x Per Week/15 Days Discharge Instructions: Apply moisturizing lotion as directed Prim Dressing: KerraCel Ag Gelling Fiber Dressing, 4x5 in (silver alginate) 1 x Per Week/15 Days ary Discharge Instructions: Apply silver alginate to wound bed as instructed Secondary Dressing: Woven Gauze Sponge, Non-Sterile 4x4 in 1 x Per Week/15 Days Discharge Instructions: Apply over primary dressing as directed. Secondary Dressing: ABD Pad, 8x10 1 x Per Week/15 Days Discharge Instructions: Apply over primary dressing as directed. Compression Wrap: ThreePress (3 layer compression wrap) 1 x Per Week/15 Days Discharge Instructions: Apply three layer compression as directed. Electronic Signature(s) Signed: 01/24/2021 2:36:38 PM By: Kalman Shan DO Entered By: Kalman Shan on 01/24/2021 14:35:01 -------------------------------------------------------------------------------- Problem List Details Patient Name: Date of Service: [name], [place] 01/24/2021 1:15 PM Medical Record Number: [id number] Patient Account Number: [id number] Date of Birth/Sex: Treating RN: Aug 29, 1925 (88 y.o. [name] Primary Care Provider: [name] Other Clinician: Referring Provider: Treating Provider/Extender: [name] in Treatment: 41 Active Problems ICD-10 Encounter Code Description Active Date MDM Diagnosis C44.92 Squamous cell carcinoma of skin, unspecified 06/11/2020 No Yes L97.812 Non-pressure chronic ulcer of other part of right lower leg with fat layer 06/11/2020 No Yes exposed L97.822 Non-pressure chronic ulcer of other part of left lower leg with fat layer exposed5/23/2022 No Yes I87.2 Venous insufficiency (chronic) (peripheral) 06/11/2020 No Yes Inactive Problems Resolved Problems Electronic Signature(s) Signed: 01/24/2021 2:36:38 PM By: Kalman Shan DO Entered By: Kalman Shan on 01/24/2021 14:32:18 -------------------------------------------------------------------------------- Progress Note Details Patient Name: Date of Service: [name], [place]. 01/24/2021 1:15 PM Medical Record Number: [id number] Patient Account Number: [id number] Date of Birth/Sex: Treating RN: 10/16/1925 (88 y.o. [name] Primary Care Provider: [name] Other Clinician: Referring Provider: Treating Provider/Extender: [name] in Treatment: 56 Subjective Chief Complaint Information obtained from Patient Bilateral lower extremity wounds that have been biopsied and positive for squamous cell carcinoma History of Present Illness (HPI) The following HPI elements were documented for the patient's wound: Location: left leg Admission  5/23 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 She states that the wound on the right has been present for a year. The 1 on  the left has opened up 1 month ago. She is followed with oncology for this issue as she had biopsies that showed squamous cell carcinoma. She is also seeing radiation oncology for treatment options. She presents today because she would like for her wounds to be healed by [place]. She currently denies signs of infection. 6/1; patient presents for 1 week follow-up. She states she has tolerated the leg wraps well. She states these do not bother her and is happy to continue with them. She is scheduled to see her oncologist today to go over treatment options for the bilateral lower extremity squamous cell carcinoma. Radiation is currently not a recommended option. Patient states she overall feels well. 6/22; patient presents for 3-week follow-up. She has tolerated the wraps well until her last wrap where she states they were uncomfortable. She attributes this to the home health nurse. She denies signs of infection. She has started her first treatment of antibody infusions for her Bilateral lower extremity squamous cell carcinoma. She has no complaints today. 7/21; patient presents for 1 month follow-up. Unfortunately she has not had good experience with her wrap changes with home health. She would like to do her own dressing changes. She continues to do her antibody infusions. She denies signs of infection. 7/28; patient presents for 1 week follow-up. At last clinic visit she was switched to daily dressing changes due to issues with the wrap and home health placing them. Unfortunately she has developed weeping to her legs bilaterally. She would like to be  placed in wraps today. She would also like to follow with [place] weekly for wrap changes instead of having home health change them. She denies signs of infection. 8/4; patient presents for 1 week follow-up. She has tolerated the Kerlix/Coban wraps well. She no longer has weeping to her legs. She took the wrap off 1 day before coming in to be able to take a shower. She has no issues or complaints today. She denies signs of infection. 8/18; patient presents for follow-up. Patient has tolerated the wraps well. She brought her Velcro compression wraps today. She has no issues or complaints today. She had her chemotherapy infusion yesterday without issues. She denies signs of infection. 8/25; patient presents for follow-up. She used her juxta lite compressions for the past week. It is unclear if she is able to put these on correctly since she states she has a hard time getting them to look right. She reports 2 open wounds. She currently denies signs of infection. 9/1; patient presents for 1 week follow-up. She has 1 open wound. She tolerated the compression wraps well. She currently denies signs of infection. 9/8; patient presents for 1 week follow-up. She has 2 open wounds 1 on each leg. She has tolerated the compression wrap well. She currently denies signs of infection. 9/15; patient presents for 1 week follow-up. She now has 3 wounds. 2 on the left and 1 on the right. She continues to tolerate the compression wrap well. She currently denies signs of infection. She has obtained furosemide by her primary care physician and would like to discuss when to take this. 9/22; patient presents for 1 week follow-up. She has scattered wounds on her lower extremities bilaterally. She did take furosemide twice in the past week. She does not recall having to urinate more frequently. She tolerated the 3 layer compression well. She denies signs of infection. 10/13; patient has not been here recently because of [place].  Apparently the facility was only putting gauze  on her legs. This is a patient I do not normally see. 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 Ronnald Ramp at [facility] dermatology. She is quite convincing that she did not have radiation to her lower legs. It is likely she also has significant chronic venous insufficiency stasis dermatitis. We have been using silver alginate under kerlix Coban. She has obvious open areas medially on the right and areas on the left. She also has areas of extensive dry flaking adherent areas on the right and to a lesser extent on the left anterior. Nodular areas on the left lateral lower leg left posterior calf and right mid calf medially. 10/21; patient presents for follow-up. She has no issues or complaints today. She has tolerated the 3 layer compression well bilaterally. She denies signs of infection. 10/27; patient presents for follow-up. She continues to tolerate the 3 layer compression wrap well. She denies signs of infection. 11/30; patient presents for follow-up. She has no issues or complaints today. 11/10; patient arrived in clinic today for nurse visit accompanied by her daughter from [place]. The daughter had multiple questions so we turned this into doctors visit. Apparently after the last time I saw this woman in October she went to see Dr. Ronnald Ramp but he did not take the wraps off. She previously was treated with Cemiplimab for her squamous cell carcinoma. She did not receive radiation to her legs. I had wanted Dr. Ronnald Ramp to look at this because of the exceptionally damaged skin on her lower legs bilaterally. She has odd looking wounds on the left medial lower leg also extending posteriorly which we have not made a lot of progress on. But she also has a raised hyperkeratotic nodules on the right anterior tibial area plaques on the left medial lower thigh. These are not areas that are  under compression. We have been using silver alginate on any open areas Liberal TCA under 3 layer compression. 11/17; patient presents for follow-up. She had her wraps taken off yesterday for her dermatology appointment. She reports excessive weeping to her legs bilaterally after the wraps were taken off. She currently denies signs of infection. 12/12/2020 upon evaluation today patient appears to be doing about the same in regard to her wounds. She was actually started on Cipro after having biopsies apparently at her dermatology clinic she does not have the results back from the actual biopsy but the culture did return and apparently they placed her on the Cipro she started that just this morning. Other than that her legs appear to be doing okay at this time. 12/1; patient presents for follow-up. She has no issues or complaints today. She has started Lasix 40 mg daily to help with her bilateral leg swelling. 12/8; patient presents for follow-up. She has no issues or complaints today. 12/15; patient presents for 1 week follow-up. She has no issues or complaints today. 12/22; patient presents for follow-up. She has no issues or complaints today. 1/5; patient presents for follow-up. She has no issues or complaints today. She has tolerated the compression wraps well. Patient History Information obtained from Patient. Family History Unknown History. Social History Never smoker, Marital Status - Single, Alcohol Use - Never, Drug Use - No History, Caffeine Use - Never. Medical History Eyes Denies history of Cataracts, Optic Neuritis Ear/Nose/Mouth/Throat Denies history of Chronic sinus problems/congestion, Middle ear problems Hematologic/Lymphatic Patient has history of Anemia Denies history of Hemophilia, Human Immunodeficiency Virus,  Lymphedema, Sickle Cell Disease Respiratory Denies history of Aspiration, Asthma, Chronic Obstructive Pulmonary Disease (COPD), Pneumothorax, Sleep Apnea,  Tuberculosis Cardiovascular Patient has history of Arrhythmia - Atrial Flutter, A fibb, Congestive Heart Failure, Hypertension Denies history of Angina, Coronary Artery Disease, Deep Vein Thrombosis, Hypotension, Myocardial Infarction, Peripheral Arterial Disease, Peripheral Venous Disease, Phlebitis, Vasculitis Gastrointestinal Patient has history of Colitis Denies history of Cirrhosis , Crohnoos, Hepatitis A, Hepatitis B, Hepatitis C Endocrine Denies history of Type I Diabetes, Type II Diabetes Genitourinary Denies history of End Stage Renal Disease Immunological Denies history of Lupus Erythematosus, Raynaudoos, Scleroderma Integumentary (Skin) Denies history of History of Burn Musculoskeletal Patient has history of Osteoarthritis Denies history of Gout, Rheumatoid Arthritis, Osteomyelitis Neurologic Denies history of Dementia, Neuropathy, Quadriplegia, Paraplegia, Seizure Disorder Oncologic Denies history of Received Chemotherapy, Received Radiation Hospitalization/Surgery History - removal of rod left leg. 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 Vitals Time Taken: 1:44 PM, Height: 65 in, Weight: 163 lbs, BMI: 27.1, Temperature: 98.5 F, Pulse: 72 bpm, Respiratory Rate: 18 breaths/min, Blood Pressure: 109/68 mmHg. Cardiovascular 2+ dorsalis pedis/posterior tibialis pulses. Psychiatric pleasant and cooperative. General Notes: Right lower extremity: Small open wound limited to skin breakdown. Left lower extremity: Scattered small wounds to the posterior aspect limited to skin breakdown. Good edema control bilaterally Integumentary (Hair, Skin) Wound #15 status is Open. Original cause of wound was Gradually Appeared. The date acquired was: 11/08/2020.  The wound has been in treatment 11 weeks. The wound is located on the Right,Posterior Lower Leg. The wound measures 0.3cm length x 0.3cm width x 0.1cm depth; 0.071cm^2 area and 0.007cm^3 volume. There is Fat Layer (Subcutaneous Tissue) exposed. There is no tunneling or undermining noted. There is a medium amount of serosanguineous drainage noted. The wound margin is distinct with the outline attached to the wound base. There is large (67-100%) red, hyper - granulation within the wound bed. There is no necrotic tissue within the wound bed. Wound #7R status is Open. Original cause of wound was Blister. The date acquired was: 04/20/2020. The wound has been in treatment 32 weeks. The wound is located on the Left,Circumferential Lower Leg. The wound measures 7.5cm length x 3.5cm width x 0.1cm depth; 20.617cm^2 area and 2.062cm^3 volume. There is Fat Layer (Subcutaneous Tissue) exposed. There is no tunneling or undermining noted. There is a medium amount of serosanguineous drainage noted. The wound margin is distinct with the outline attached to the wound base. There is large (67-100%) red granulation within the wound bed. There is no necrotic tissue within the wound bed. Assessment Active Problems ICD-10 Squamous cell carcinoma of skin, unspecified Non-pressure chronic ulcer of other part of right lower leg with fat layer exposed Non-pressure chronic ulcer of other part of left lower leg with fat layer exposed Venous insufficiency (chronic) (peripheral) Patient's wounds have shown improvement in size and appearance since last clinic visit. I recommended continuing with silver alginate and 3 layer compression. No signs of infection on exam. Follow-up in 1 week Procedures Wound #15 Pre-procedure diagnosis of Wound #15 is a Venous Leg Ulcer located on the Right,Posterior Lower Leg . There was a Three Layer Compression Therapy Procedure by Lorrin Jackson, RN. Post procedure Diagnosis Wound #15: Same as  Pre-Procedure Wound #7R Pre-procedure diagnosis of Wound #7R is a Malignant Wound located on the Left,Circumferential Lower Leg . There was a Three Layer Compression Therapy Procedure  by Lorrin Jackson, RN. Post procedure Diagnosis Wound #7R: Same as Pre-Procedure Plan Follow-up Appointments: Return Appointment in 1 week. - Dr. Heber McKenney Bathing/ Shower/ Hygiene: May shower with protection but do not get wound dressing(s) wet. - Use cast protector Edema Control - Lymphedema / SCD / Other: Elevate legs to the level of the heart or above for 30 minutes daily and/or when sitting, a frequency of: - elevate the legs throughout the day heart level if possible. Avoid standing for long periods of time. Exercise regularly Additional Orders / Instructions: Follow Nutritious Diet WOUND #15: - Lower Leg Wound Laterality: Right, Posterior Cleanser: Soap and Water 1 x Per Week/15 Days Discharge Instructions: May shower and wash wound with dial antibacterial soap and water prior to dressing change. Cleanser: Wound Cleanser 1 x Per Week/15 Days Discharge Instructions: Cleanse the wound with wound cleanser prior to applying a clean dressing using gauze sponges, not tissue or cotton balls. Peri-Wound Care: Triamcinolone 15 (g) 1 x Per Week/15 Days Discharge Instructions: In clinic only. Use triamcinolone 15 (g) in clinic only. Peri-Wound Care: Sween Lotion (Moisturizing lotion) 1 x Per Week/15 Days Discharge Instructions: Apply moisturizing lotion as directed Prim Dressing: KerraCel Ag Gelling Fiber Dressing, 4x5 in (silver alginate) 1 x Per Week/15 Days ary Discharge Instructions: Apply silver alginate to wound bed as instructed Secondary Dressing: Woven Gauze Sponge, Non-Sterile 4x4 in 1 x Per Week/15 Days Discharge Instructions: Apply over primary dressing as directed. Secondary Dressing: ABD Pad, 8x10 1 x Per Week/15 Days Discharge Instructions: Apply over primary dressing as directed. Com pression  Wrap: ThreePress (3 layer compression wrap) 1 x Per Week/15 Days Discharge Instructions: Apply three layer compression as directed. WOUND #7R: - Lower Leg Wound Laterality: Left, Circumferential Cleanser: Soap and Water 1 x Per Week/15 Days Discharge Instructions: May shower and wash wound with dial antibacterial soap and water prior to dressing change. Cleanser: Wound Cleanser 1 x Per Week/15 Days Discharge Instructions: Cleanse the wound with wound cleanser prior to applying a clean dressing using gauze sponges, not tissue or cotton balls. Peri-Wound Care: Triamcinolone 15 (g) 1 x Per Week/15 Days Discharge Instructions: In clinic only. Use triamcinolone 15 (g) in clinic only. Peri-Wound Care: Sween Lotion (Moisturizing lotion) 1 x Per Week/15 Days Discharge Instructions: Apply moisturizing lotion as directed Prim Dressing: KerraCel Ag Gelling Fiber Dressing, 4x5 in (silver alginate) 1 x Per Week/15 Days ary Discharge Instructions: Apply silver alginate to wound bed as instructed Secondary Dressing: Woven Gauze Sponge, Non-Sterile 4x4 in 1 x Per Week/15 Days Discharge Instructions: Apply over primary dressing as directed. Secondary Dressing: ABD Pad, 8x10 1 x Per Week/15 Days Discharge Instructions: Apply over primary dressing as directed. Com pression Wrap: ThreePress (3 layer compression wrap) 1 x Per Week/15 Days Discharge Instructions: Apply three layer compression as directed. 1. Silver alginate under 3 layer compression 2. Follow-up in 1 week Electronic Signature(s) Signed: 01/24/2021 2:36:38 PM By: Kalman Shan DO Entered By: Kalman Shan on 01/24/2021 14:35:42 -------------------------------------------------------------------------------- HxROS Details Patient Name: Date of Service: [name], [name]. 01/24/2021 1:15 PM Medical Record Number: [id number] Patient Account Number: [id number] Date of Birth/Sex: Treating RN: 1925/10/06 (88 y.o. [name] Primary Care  Provider: [name] Other Clinician: Referring Provider: Treating Provider/Extender: [name] in Treatment: 75 Information Obtained From Patient Eyes Medical History: Negative for: Cataracts; Optic Neuritis Ear/Nose/Mouth/Throat Medical History: Negative for: Chronic sinus problems/congestion; Middle ear problems Hematologic/Lymphatic Medical History: Positive for: Anemia Negative for: Hemophilia; Human Immunodeficiency  Virus; Lymphedema; Sickle Cell Disease Respiratory Medical History: Negative for: Aspiration; Asthma; Chronic Obstructive Pulmonary Disease (COPD); Pneumothorax; Sleep Apnea; Tuberculosis Cardiovascular Medical History: Positive for: Arrhythmia - Atrial Flutter, A fibb; Congestive Heart Failure; Hypertension Negative for: Angina; Coronary Artery Disease; Deep Vein Thrombosis; Hypotension; Myocardial Infarction; Peripheral Arterial Disease; Peripheral Venous Disease; Phlebitis; Vasculitis Past Medical History Notes: hyperlipidemia Gastrointestinal Medical History: Positive for: Colitis Negative for: Cirrhosis ; Crohns; Hepatitis A; Hepatitis B; Hepatitis C Endocrine Medical History: Negative for: Type I Diabetes; Type II Diabetes Past Medical History Notes: hypothyroidism Genitourinary Medical History: Negative for: End Stage Renal Disease Immunological Medical History: Negative for: Lupus Erythematosus; Raynauds; Scleroderma Integumentary (Skin) Medical History: Negative for: History of Burn Musculoskeletal Medical History: Positive for: Osteoarthritis Negative for: Gout; Rheumatoid Arthritis; Osteomyelitis Neurologic Medical History: Negative for: Dementia; Neuropathy; Quadriplegia; Paraplegia; Seizure Disorder Past Medical History Notes: lumbar spindylolysis Oncologic Medical History: Negative for: Received Chemotherapy; Received Radiation Past Medical History Notes: BLE squamous ceel  carcionoma Immunizations Pneumococcal Vaccine: Received Pneumococcal Vaccination: Yes Received Pneumococcal Vaccination On or After 60th Birthday: No Implantable Devices None Hospitalization / Surgery History Type of Hospitalization/Surgery removal of rod left leg Family and Social History Unknown History: Yes; Never smoker; Marital Status - Single; Alcohol Use: Never; Drug Use: No History; Caffeine Use: Never; Financial Concerns: No; Food, Clothing or Shelter Needs: No; Support System Lacking: No; Transportation Concerns: No Electronic Signature(s) Signed: 01/24/2021 2:36:38 PM By: Kalman Shan DO Signed: 01/24/2021 5:48:30 PM By: Levan Hurst RN, BSN Entered By: Kalman Shan on 01/24/2021 14:34:13 -------------------------------------------------------------------------------- [place] Details Patient Name: Date of Service: [name], [place]. 01/24/2021 Medical Record Number: [id number] Patient Account Number: [id number] Date of Birth/Sex: Treating RN: [birth year] (88 y.o. [name] Primary Care Provider: [name] Other Clinician: Referring Provider: Treating Provider/Extender: [name] in Treatment: 32 Diagnosis Coding ICD-10 Codes Code Description C44.92 Squamous cell carcinoma of skin, unspecified L97.812 Non-pressure chronic ulcer of other part of right lower leg with fat layer exposed L97.822 Non-pressure chronic ulcer of other part of left lower leg with fat layer exposed I87.2 Venous insufficiency (chronic) (peripheral) Facility Procedures CPT4: Code [id number] Description: 81 BILATERAL: Application of multi-layer venous compression system; leg (below knee), including ankle and t. ICD-10 Diagnosis Description L97.822 Non-pressure chronic ulcer of other part of left lower leg with fat layer exposed L97.812  Non-pressure chronic ulcer of other part of right lower leg with fat layer  exposed Modifier: Quantity: 1 Physician Procedures : CPT4 Code Description Modifier [id number] - WC PHYS LEVEL 3 - EST PT ICD-10 Diagnosis Description L97.812 Non-pressure chronic ulcer of other part of right lower leg with fat layer exposed L97.822 Non-pressure chronic ulcer of other part of left  lower leg with fat layer exposed C44.92 Squamous cell carcinoma of skin, unspecified I87.2 Venous insufficiency (chronic) (peripheral) Quantity: 1 Electronic Signature(s) Signed: 01/24/2021 2:36:38 PM By: Kalman Shan DO Entered By: Kalman Shan on 01/24/2021 14:36:00

## 2021-01-24 NOTE — Progress Notes (Signed)
[name] ([id number]) , Visit Report for 01/24/2021 Arrival Information Details Patient Name: Date of Service: [name], [place]. 01/24/2021 1:15 PM Medical Record Number: [id number] Patient Account Number: [id number] Date of Birth/Sex: Treating RN: January 15, 1926 (88 y.o. [name] Primary Care [name]: [name] Other Clinician: Referring [name]: Treating [name]/Extender: [name] in Treatment: 76 Visit Information History Since Last Visit Added or deleted any medications: No Patient Arrived: [place] Any new allergies or adverse reactions: No Arrival Time: 13:44 Had a fall or experienced change in No Transfer Assistance: None activities of daily living that may affect Patient Identification Verified: Yes risk of falls: Secondary Verification Process Completed: Yes Signs or symptoms of abuse/neglect since last visito No Patient Requires Transmission-Based No Hospitalized since last visit: No Precautions: Implantable device outside of the clinic excluding No Patient Has Alerts: Yes cellular tissue based products placed in the center Patient Alerts: R ABI = Non compressible since last visit: L ABI = Non compressible Has Dressing in Place as Prescribed: Yes Has Compression in Place as Prescribed: Yes Pain Present Now: No Electronic Signature(s) Signed: 01/24/2021 5:38:48 PM By: Lorrin Jackson Entered By: Lorrin Jackson on 01/24/2021 13:44:38 -------------------------------------------------------------------------------- Compression Therapy Details Patient Name: Date of Service: [name], [name] 01/24/2021 1:15 PM Medical Record Number: [id number] Patient Account Number: [id number] Date of Birth/Sex: Treating RN: 06-08-1925 (88 y.o. [name] Primary Care [name]: [name] Other Clinician: Referring [name]: Treating [name]/Extender: [name] in Treatment:  32 Compression Therapy Performed for Wound Assessment: Wound #15 Bell,Posterior Lower Leg Performed By: Clinician Lorrin Jackson, RN Compression Type: Three Layer Post Procedure Diagnosis Same as Pre-procedure Electronic Signature(s) Signed: 01/24/2021 5:38:48 PM By: Lorrin Jackson Entered By: Lorrin Jackson on 01/24/2021 14:06:05 -------------------------------------------------------------------------------- Compression Therapy Details Patient Name: Date of Service: [name], [name] 01/24/2021 1:15 PM Medical Record Number: [id number] Patient Account Number: [id number] Date of Birth/Sex: Treating RN: 1925/09/01 (88 y.o. [name] Primary Care [name]: [name] Other Clinician: Referring [name]: Treating [name]/Extender: [name] in Treatment: 32 Compression Therapy Performed for Wound Assessment: Wound #7R Left,Circumferential Lower Leg Performed By: Clinician Lorrin Jackson, RN Compression Type: Three Layer Post Procedure Diagnosis Same as Pre-procedure Electronic Signature(s) Signed: 01/24/2021 5:38:48 PM By: Lorrin Jackson Entered By: Lorrin Jackson on 01/24/2021 14:06:05 -------------------------------------------------------------------------------- Encounter Discharge Information Details Patient Name: Date of Service: [name], [place]. 01/24/2021 1:15 PM Medical Record Number: [id number] Patient Account Number: [id number] Date of Birth/Sex: Treating RN: 01/25/1925 (88 y.o. [name] Primary Care [name]: [name] Other Clinician: Referring [name]: Treating [name]/Extender: [name] in Treatment: 30 Encounter Discharge Information Items Discharge Condition: Stable Ambulatory Status: Walker Discharge Destination: Home Transportation: Other Schedule Follow-up Appointment: Yes Clinical Summary of Care: Provided on 01/24/2021 Form Type Recipient Paper  Patient Patient Electronic Signature(s) Signed: 01/24/2021 5:38:48 PM By: Lorrin Jackson Entered By: Lorrin Jackson on 01/24/2021 14:31:54 -------------------------------------------------------------------------------- Lower Extremity Assessment Details Patient Name: Date of Service: [name], [name] 01/24/2021 1:15 PM Medical Record Number: [id number] Patient Account Number: [id number] Date of Birth/Sex: Treating RN: [birth year] (88 y.o. [name] Primary Care [name]: [name] Other Clinician: Referring [name]: Treating [name]/Extender: [name] in Treatment: 32 Edema Assessment Assessed: [Left: Yes] [Bell: Yes] Edema: [Left: Yes] [Bell: Yes] Calf Left: Bell: Point of Measurement: 30 cm From Medial Instep 32 cm 30.4 cm Ankle Left: Bell: Point  of Measurement: 9 cm From Medial Instep 22.5 cm 21 cm Vascular Assessment Pulses: Dorsalis Pedis Palpable: [Left:Yes] [Bell:Yes] Electronic Signature(s) Signed: 01/24/2021 5:38:48 PM By: Lorrin Jackson Entered By: Lorrin Jackson on 01/24/2021 13:52:45 -------------------------------------------------------------------------------- Multi Wound Chart Details Patient Name: Date of Service: [name], [name] 01/24/2021 1:15 PM Medical Record Number: [id number] Patient Account Number: [id number] Date of Birth/Sex: Treating RN: 06-Mar-1925 (88 y.o. [name] Primary Care [name]: [name] Other Clinician: Referring [name]: Treating [name]/Extender: [name] in Treatment: 32 Vital Signs Height(in): 65 Pulse(bpm): 66 Weight(lbs): 163 Blood Pressure(mmHg): 109/68 Body Mass Index(BMI): 27 Temperature(F): 98.5 Respiratory Rate(breaths/min): 18 Photos: [N/A:N/A] Bell, Posterior Lower Leg Left, Circumferential Lower Leg N/A Wound Location: Gradually Appeared Blister N/A Wounding Event: Venous Leg Ulcer Malignant  Wound N/A Primary Etiology: Anemia, Arrhythmia, Congestive Heart Anemia, Arrhythmia, Congestive Heart N/A Comorbid History: Failure, Hypertension, Colitis, Failure, Hypertension, Colitis, Osteoarthritis Osteoarthritis 11/08/2020 04/20/2020 N/A Date Acquired: 11 32 N/A Weeks of Treatment: Open Open N/A Wound Status: No Yes N/A Wound Recurrence: Yes No N/A Clustered Wound: 0.3x0.3x0.1 7.5x3.5x0.1 N/A Measurements L x W x D (cm) 0.071 20.617 N/A A (cm) : rea 0.007 2.062 N/A Volume (cm) : 98.20% -571.30% N/A % Reduction in Area: 98.20% -571.70% N/A % Reduction in Volume: Full Thickness Without Exposed Full Thickness Without Exposed N/A Classification: Support Structures Support Structures Medium Medium N/A Exudate Amount: Serosanguineous Serosanguineous N/A Exudate Type: red, brown red, brown N/A Exudate Color: Distinct, outline attached Distinct, outline attached N/A Wound Margin: Large (67-100%) Large (67-100%) N/A Granulation Amount: Red, Hyper-granulation Red N/A Granulation Quality: None Present (0%) None Present (0%) N/A Necrotic Amount: Fat Layer (Subcutaneous Tissue): Yes Fat Layer (Subcutaneous Tissue): Yes N/A Exposed Structures: Fascia: No Fascia: No Tendon: No Tendon: No Muscle: No Muscle: No Joint: No Joint: No Bone: No Bone: No Large (67-100%) N/A N/A Epithelialization: Compression Therapy Compression Therapy N/A Procedures Performed: Treatment Notes Wound #15 (Lower Leg) Wound Laterality: Bell, Posterior Cleanser Soap and Water Discharge Instruction: May shower and wash wound with dial antibacterial soap and water prior to dressing change. Wound Cleanser Discharge Instruction: Cleanse the wound with wound cleanser prior to applying a clean dressing using gauze sponges, not tissue or cotton balls. Peri-Wound Care Triamcinolone 15 (g) Discharge Instruction: In clinic only. Use triamcinolone 15 (g) in clinic only. Sween Lotion  (Moisturizing lotion) Discharge Instruction: Apply moisturizing lotion as directed Topical Primary Dressing KerraCel Ag Gelling Fiber Dressing, 4x5 in (silver alginate) Discharge Instruction: Apply silver alginate to wound bed as instructed Secondary Dressing Woven Gauze Sponge, Non-Sterile 4x4 in Discharge Instruction: Apply over primary dressing as directed. ABD Pad, 8x10 Discharge Instruction: Apply over primary dressing as directed. Secured With Compression Wrap ThreePress (3 layer compression wrap) Discharge Instruction: Apply three layer compression as directed. Compression Stockings Add-Ons Wound #7R (Lower Leg) Wound Laterality: Left, Circumferential Cleanser Soap and Water Discharge Instruction: May shower and wash wound with dial antibacterial soap and water prior to dressing change. Wound Cleanser Discharge Instruction: Cleanse the wound with wound cleanser prior to applying a clean dressing using gauze sponges, not tissue or cotton balls. Peri-Wound Care Triamcinolone 15 (g) Discharge Instruction: In clinic only. Use triamcinolone 15 (g) in clinic only. Sween Lotion (Moisturizing lotion) Discharge Instruction: Apply moisturizing lotion as directed Topical Primary Dressing KerraCel Ag Gelling Fiber Dressing, 4x5 in (silver alginate) Discharge Instruction: Apply silver alginate to wound bed as instructed Secondary Dressing Woven Gauze Sponge, Non-Sterile 4x4 in Discharge Instruction: Apply over primary dressing as  directed. ABD Pad, 8x10 Discharge Instruction: Apply over primary dressing as directed. Secured With Compression Wrap ThreePress (3 layer compression wrap) Discharge Instruction: Apply three layer compression as directed. Compression Stockings Add-Ons Electronic Signature(s) Signed: 01/24/2021 2:36:38 PM By: Kalman Shan DO Signed: 01/24/2021 5:48:30 PM By: Levan Hurst RN, BSN Entered By: Kalman Shan on 01/24/2021  14:32:35 -------------------------------------------------------------------------------- [place] Details Patient Name: Date of Service: [name], [place]. 01/24/2021 1:15 PM Medical Record Number: [id number] Patient Account Number: [id number] Date of Birth/Sex: Treating RN: 1925/04/03 (88 y.o. [name] Primary Care Takasha Vetere: [name] Other Clinician: Referring Shae Hinnenkamp: Treating Stony Stegmann/Extender: [name] in Treatment: 24 Multidisciplinary Care Plan reviewed with physician Active Inactive Venous Leg Ulcer Nursing Diagnoses: Knowledge deficit related to disease process and management Potential for venous Insuffiency (use before diagnosis confirmed) Goals: Patient will maintain optimal edema control Date Initiated: 11/01/2020 Target Resolution Date: 02/28/2021 Goal Status: Active Interventions: Assess peripheral edema status every visit. Compression as ordered Provide education on venous insufficiency Treatment Activities: Therapeutic compression applied : 11/01/2020 Notes: 12/27/20: Edema control ongoing 01/24/21: Edema control continues, using 3 layer compression Wound/Skin Impairment Nursing Diagnoses: Impaired tissue integrity Knowledge deficit related to ulceration/compromised skin integrity Goals: Patient/caregiver will verbalize understanding of skin care regimen Date Initiated: 06/11/2020 Target Resolution Date: 02/28/2021 Goal Status: Active Interventions: Assess patient/caregiver ability to obtain necessary supplies Assess patient/caregiver ability to perform ulcer/skin care regimen upon admission and as needed Assess ulceration(s) every visit Provide education on ulcer and skin care Notes: 10/11/20: Wound care regimen ongoing. 12/27/20: Wound care continues Electronic Signature(s) Signed: 01/24/2021 5:38:48 PM By: Lorrin Jackson Entered By: Lorrin Jackson on 01/24/2021  13:36:58 -------------------------------------------------------------------------------- Pain Assessment Details Patient Name: Date of Service: [name], [place]. 01/24/2021 1:15 PM Medical Record Number: [id number] Patient Account Number: [id number] Date of Birth/Sex: Treating RN: 13-Oct-1925 (88 y.o. [name] Primary Care Joycie Aerts: [name] Other Clinician: Referring Kynlea Blackston: Treating Jw Covin/Extender: [name] in Treatment: 66 Active Problems Location of Pain Severity and Description of Pain Patient Has Paino No Site Locations Pain Management and Medication Current Pain Management: Electronic Signature(s) Signed: 01/24/2021 5:38:48 PM By: Lorrin Jackson Entered By: Lorrin Jackson on 01/24/2021 13:45:45 -------------------------------------------------------------------------------- Patient/Caregiver Education Details Patient Name: Date of Service: [name], [place] 1/5/2023andnbsp1:15 PM Medical Record Number: [id number] Patient Account Number: [id number] Date of Birth/Gender: Treating RN: 01/17/1926 (88 y.o. [name] Primary Care Physician: [name] Other Clinician: Referring Physician: Treating Physician/Extender: [name] in Treatment: 62 Education Assessment Education Provided To: Patient Education Topics Provided Venous: Methods: Explain/Verbal, Printed Responses: State content correctly Wound/Skin Impairment: Methods: Explain/Verbal, Printed Responses: State content correctly Electronic Signature(s) Signed: 01/24/2021 5:38:48 PM By: Lorrin Jackson Entered By: Lorrin Jackson on 01/24/2021 13:37:23 -------------------------------------------------------------------------------- Wound Assessment Details Patient Name: Date of Service: [name], [place]. 01/24/2021 1:15 PM Medical Record Number: [id number] Patient Account Number: [id number] Date of  Birth/Sex: Treating RN: December 15, 1925 (88 y.o. [name] Primary Care Alexandr Yaworski: [name] Other Clinician: Referring Chanique Duca: Treating Kortney Schoenfelder/Extender: [name] in Treatment: 32 Wound Status Wound Number: 15 Primary Venous Leg Ulcer Etiology: Wound Location: Bell, Posterior Lower Leg Wound Open Wounding Event: Gradually Appeared Status: Date Acquired: 11/08/2020 Comorbid Anemia, Arrhythmia, Congestive Heart Failure, Hypertension, Weeks Of Treatment: 11 History: Colitis, Osteoarthritis Clustered Wound: Yes Photos Wound Measurements Length: (cm) 0.3 Width: (cm) 0.3 Depth: (cm) 0.1 Area: (cm) 0.071 Volume: (cm) 0.007 %  Reduction in Area: 98.2% % Reduction in Volume: 98.2% Epithelialization: Large (67-100%) Tunneling: No Undermining: No Wound Description Classification: Full Thickness Without Exposed Support Structures Wound Margin: Distinct, outline attached Exudate Amount: Medium Exudate Type: Serosanguineous Exudate Color: red, brown Foul Odor After Cleansing: No Slough/Fibrino No Wound Bed Granulation Amount: Large (67-100%) Exposed Structure Granulation Quality: Red, Hyper-granulation Fascia Exposed: No Necrotic Amount: None Present (0%) Fat Layer (Subcutaneous Tissue) Exposed: Yes Tendon Exposed: No Muscle Exposed: No Joint Exposed: No Bone Exposed: No Treatment Notes Wound #15 (Lower Leg) Wound Laterality: Bell, Posterior Cleanser Soap and Water Discharge Instruction: May shower and wash wound with dial antibacterial soap and water prior to dressing change. Wound Cleanser Discharge Instruction: Cleanse the wound with wound cleanser prior to applying a clean dressing using gauze sponges, not tissue or cotton balls. Peri-Wound Care Triamcinolone 15 (g) Discharge Instruction: In clinic only. Use triamcinolone 15 (g) in clinic only. Sween Lotion (Moisturizing lotion) Discharge Instruction: Apply  moisturizing lotion as directed Topical Primary Dressing KerraCel Ag Gelling Fiber Dressing, 4x5 in (silver alginate) Discharge Instruction: Apply silver alginate to wound bed as instructed Secondary Dressing Woven Gauze Sponge, Non-Sterile 4x4 in Discharge Instruction: Apply over primary dressing as directed. ABD Pad, 8x10 Discharge Instruction: Apply over primary dressing as directed. Secured With Compression Wrap ThreePress (3 layer compression wrap) Discharge Instruction: Apply three layer compression as directed. Compression Stockings Add-Ons Electronic Signature(s) Signed: 01/24/2021 5:38:48 PM By: Lorrin Jackson Signed: 01/24/2021 5:48:30 PM By: Levan Hurst RN, BSN Entered By: Levan Hurst on 01/24/2021 14:03:14 -------------------------------------------------------------------------------- Wound Assessment Details Patient Name: Date of Service: [name], [place]. 01/24/2021 1:15 PM Medical Record Number: [id number] Patient Account Number: [id number] Date of Birth/Sex: Treating RN: 04-03-1925 (88 y.o. [name] Primary Care Burel Kahre: [name] Other Clinician: Referring Nayden Czajka: Treating Mora Pedraza/Extender: [name] in Treatment: 32 Wound Status Wound Number: 7R Primary Malignant Wound Etiology: Wound Location: Left, Circumferential Lower Leg Wound Open Wounding Event: Blister Status: Date Acquired: 04/20/2020 Comorbid Anemia, Arrhythmia, Congestive Heart Failure, Hypertension, Weeks Of Treatment: 32 History: Colitis, Osteoarthritis Clustered Wound: No Photos Wound Measurements Length: (cm) 7.5 Width: (cm) 3.5 Depth: (cm) 0.1 Area: (cm) 20.617 Volume: (cm) 2.062 % Reduction in Area: -571.3% % Reduction in Volume: -571.7% Tunneling: No Undermining: No Wound Description Classification: Full Thickness Without Exposed Support Structures Wound Margin: Distinct, outline attached Exudate Amount:  Medium Exudate Type: Serosanguineous Exudate Color: red, brown Foul Odor After Cleansing: No Slough/Fibrino No Wound Bed Granulation Amount: Large (67-100%) Exposed Structure Granulation Quality: Red Fascia Exposed: No Necrotic Amount: None Present (0%) Fat Layer (Subcutaneous Tissue) Exposed: Yes Tendon Exposed: No Muscle Exposed: No Joint Exposed: No Bone Exposed: No Treatment Notes Wound #7R (Lower Leg) Wound Laterality: Left, Circumferential Cleanser Soap and Water Discharge Instruction: May shower and wash wound with dial antibacterial soap and water prior to dressing change. Wound Cleanser Discharge Instruction: Cleanse the wound with wound cleanser prior to applying a clean dressing using gauze sponges, not tissue or cotton balls. Peri-Wound Care Triamcinolone 15 (g) Discharge Instruction: In clinic only. Use triamcinolone 15 (g) in clinic only. Sween Lotion (Moisturizing lotion) Discharge Instruction: Apply moisturizing lotion as directed Topical Primary Dressing KerraCel Ag Gelling Fiber Dressing, 4x5 in (silver alginate) Discharge Instruction: Apply silver alginate to wound bed as instructed Secondary Dressing Woven Gauze Sponge, Non-Sterile 4x4 in Discharge Instruction: Apply over primary dressing as directed. ABD Pad, 8x10 Discharge Instruction: Apply over primary dressing as directed. Secured With Compression Wrap ThreePress (3 layer  compression wrap) Discharge Instruction: Apply three layer compression as directed. Compression Stockings Add-Ons Electronic Signature(s) Signed: 01/24/2021 5:38:48 PM By: Lorrin Jackson Signed: 01/24/2021 5:48:30 PM By: Levan Hurst RN, BSN Entered By: Levan Hurst on 01/24/2021 14:03:44 -------------------------------------------------------------------------------- [place] Details Patient Name: Date of Service: [name], [place]. 01/24/2021 1:15 PM Medical Record Number: [id number] Patient Account Number: [id number] Date of  Birth/Sex: Treating RN: 1925/09/04 (88 y.o. [name] Primary Care Melvenia Favela: [name] Other Clinician: Referring Rahsaan Weakland: Treating Sehaj Kolden/Extender: [name] in Treatment: 32 Vital Signs Time Taken: 13:44 Temperature (F): 98.5 Height (in): 65 Pulse (bpm): 72 Weight (lbs): 163 Respiratory Rate (breaths/min): 18 Body Mass Index (BMI): 27.1 Blood Pressure (mmHg): 109/68 Reference Range: 80 - 120 mg / dl Electronic Signature(s) Signed: 01/24/2021 5:38:48 PM By: Lorrin Jackson Entered By: Lorrin Jackson on 01/24/2021 13:45:03

## 2021-01-28 ENCOUNTER — Other Ambulatory Visit: Payer: Self-pay | Admitting: Internal Medicine

## 2021-01-31 ENCOUNTER — Encounter (HOSPITAL_BASED_OUTPATIENT_CLINIC_OR_DEPARTMENT_OTHER): Payer: Medicare Other | Admitting: Internal Medicine

## 2021-01-31 ENCOUNTER — Other Ambulatory Visit: Payer: Self-pay

## 2021-01-31 DIAGNOSIS — I890 Lymphedema, not elsewhere classified: Secondary | ICD-10-CM | POA: Diagnosis not present

## 2021-01-31 DIAGNOSIS — L97812 Non-pressure chronic ulcer of other part of right lower leg with fat layer exposed: Secondary | ICD-10-CM | POA: Diagnosis not present

## 2021-01-31 DIAGNOSIS — L97822 Non-pressure chronic ulcer of other part of left lower leg with fat layer exposed: Secondary | ICD-10-CM

## 2021-01-31 DIAGNOSIS — I872 Venous insufficiency (chronic) (peripheral): Secondary | ICD-10-CM

## 2021-01-31 DIAGNOSIS — Z85828 Personal history of other malignant neoplasm of skin: Secondary | ICD-10-CM | POA: Diagnosis not present

## 2021-02-01 NOTE — Progress Notes (Signed)
[name] ([id number]) , Visit Report for 01/31/2021 Chief Complaint Document Details Patient Name: Date of Service: [name], [name]. 01/31/2021 1:00 PM Medical Record Number: [id number] Patient Account Number: [id number] Date of Birth/Sex: Treating RN: 24-May-1925 (88 y.o. [name] Primary Care Provider: [name] Other Clinician: Referring Provider: Treating Provider/Extender: [name] in Treatment: 35 Information Obtained from: Patient Chief Complaint Bilateral lower extremity wounds that have been biopsied and positive for squamous cell carcinoma Electronic Signature(s) Signed: 01/31/2021 2:32:16 PM By: Kalman Shan DO Entered By: Kalman Shan on 01/31/2021 13:37:35 -------------------------------------------------------------------------------- HPI Details Patient Name: Date of Service: [name], [name] 01/31/2021 1:00 PM Medical Record Number: [id number] Patient Account Number: [id number] Date of Birth/Sex: Treating RN: [birth year] (88 y.o. [name] Primary Care Provider: [name] Other Clinician: Referring Provider: Treating Provider/Extender: [name] in Treatment: 67 History of Present Illness Location: left leg HPI Description: Admission 5/23 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 She states that the wound on the right has been present for a year. The 1 on the left has opened up 1 month ago. She is followed with oncology for this issue as she had biopsies that showed squamous cell carcinoma. She is also seeing radiation oncology for treatment options. She presents today because she would like for her wounds to be healed  by [place]. She currently denies signs of infection. 6/1; patient presents for 1 week follow-up. She states she has tolerated the leg wraps well. She states these do not bother her and is happy to continue with them. She is scheduled to see her oncologist today to go over treatment options for the bilateral lower extremity squamous cell carcinoma. Radiation is currently not a recommended option. Patient states she overall feels well. 6/22; patient presents for 3-week follow-up. She has tolerated the wraps well until her last wrap where she states they were uncomfortable. She attributes this to the home health nurse. She denies signs of infection. She has started her first treatment of antibody infusions for her Bilateral lower extremity squamous cell carcinoma. She has no complaints today. 7/21; patient presents for 1 month follow-up. Unfortunately she has not had good experience with her wrap changes with home health. She would like to do her own dressing changes. She continues to do her antibody infusions. She denies signs of infection. 7/28; patient presents for 1 week follow-up. At last clinic visit she was switched to daily dressing changes due to issues with the wrap and home health placing them. Unfortunately she has developed weeping to her legs bilaterally. She would like to be placed in wraps today. She would also like to follow with [place] weekly for wrap changes instead of having home health change them. She denies signs of infection. 8/4; patient presents for 1 week follow-up. She has tolerated the Kerlix/Coban wraps well. She no longer has weeping to her legs. She took the wrap off 1 day before coming in to be able to take a shower. She has no issues or complaints today. She denies signs of infection. 8/18; patient presents for follow-up. Patient has tolerated the wraps well. She brought her Velcro compression wraps today. She has no issues or complaints today. She had her chemotherapy infusion  yesterday without issues. She denies signs of infection. 8/25; patient presents for follow-up. She used her juxta lite compressions for the past week. It is unclear if she is able to put these on correctly since she states she has a hard time getting them to look right. She reports 2 open wounds. She currently denies signs of infection. 9/1; patient presents for 1 week follow-up. She has 1 open wound. She tolerated the compression wraps well. She currently denies signs of infection. 9/8; patient presents for 1 week follow-up. She has 2 open wounds 1 on each leg. She has tolerated the compression wrap well. She currently denies signs of infection. 9/15; patient presents for 1 week follow-up. She now has 3 wounds. 2 on the left and 1 on the right. She continues to tolerate the compression wrap well. She currently denies signs of infection. She has obtained furosemide by her primary care physician and would like to discuss when to take this. 9/22; patient presents for 1 week follow-up. She has scattered wounds on her lower extremities bilaterally. She did take furosemide twice in the past week. She does not recall having to urinate more frequently. She tolerated the 3 layer compression well. She denies signs of infection. 10/13; patient has not been here recently because of [place]. Apparently the facility was only putting gauze on her legs. This is a patient I do not normally see. 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 Ronnald Ramp at [facility] dermatology. She is quite convincing that she did not have radiation to her lower legs. It is likely she also has significant chronic venous insufficiency stasis dermatitis. We have been using silver alginate under kerlix Coban. She has obvious open areas medially on the right and areas on the left. She also has areas of extensive dry flaking adherent areas on the right and to a lesser  extent on the left anterior. Nodular areas on the left lateral lower leg left posterior calf and right mid calf medially. 10/21; patient presents for follow-up. She has no issues or complaints today. She has tolerated the 3 layer compression well bilaterally. She denies signs of infection. 10/27; patient presents for follow-up. She continues to tolerate the 3 layer compression wrap well. She denies signs of infection. 11/30; patient presents for follow-up. She has no issues or complaints today. 11/10; patient arrived in clinic today for nurse visit accompanied by her daughter from [place]. The daughter had multiple questions so we turned this into doctors visit. Apparently after the last time I saw this woman in October she went to see Dr. Ronnald Ramp but he did not take the wraps off. She previously was treated with Cemiplimab for her squamous cell carcinoma. She did not receive radiation to her legs. I had wanted Dr. Ronnald Ramp to look at this because of the exceptionally damaged skin on her lower legs bilaterally. She has odd looking wounds on the left medial lower leg also extending posteriorly which we have not made a lot of progress on. But she also has a raised hyperkeratotic nodules on the right anterior tibial area plaques on the left medial lower thigh. These are not areas that are under compression. We have been using silver alginate on any open areas Liberal TCA under 3 layer compression. 11/17; patient presents for follow-up. She had her wraps taken off yesterday for her dermatology appointment. She reports excessive weeping to her legs bilaterally after the wraps were  taken off. She currently denies signs of infection. 12/12/2020 upon evaluation today patient appears to be doing about the same in regard to her wounds. She was actually started on Cipro after having biopsies apparently at her dermatology clinic she does not have the results back from the actual biopsy but the culture did return and  apparently they placed her on the Cipro she started that just this morning. Other than that her legs appear to be doing okay at this time. 12/1; patient presents for follow-up. She has no issues or complaints today. She has started Lasix 40 mg daily to help with her bilateral leg swelling. 12/8; patient presents for follow-up. She has no issues or complaints today. 12/15; patient presents for 1 week follow-up. She has no issues or complaints today. 12/22; patient presents for follow-up. She has no issues or complaints today. 1/5; patient presents for follow-up. She has no issues or complaints today. She has tolerated the compression wraps well. 1/12; patient presents for follow-up. She is tolerated the compression wrap well and has no issues or complaints today. She denies signs of infection. Electronic Signature(s) Signed: 01/31/2021 2:32:16 PM By: Kalman Shan DO Entered By: Kalman Shan on 01/31/2021 13:37:55 -------------------------------------------------------------------------------- Physical Exam Details Patient Name: Date of Service: [name], Rhenda A. 01/31/2021 1:00 PM Medical Record Number: [id number] Patient Account Number: [id number] Date of Birth/Sex: Treating RN: February 06, 1925 (88 y.o. [name] Primary Care Provider: [name] Other Clinician: Referring Provider: Treating Provider/Extender: [name] in Treatment: 68 Constitutional respirations regular, non-labored and within target range for patient.. Cardiovascular 2+ dorsalis pedis/posterior tibialis pulses. Psychiatric pleasant and cooperative. Notes Scattered wounds to her lower extremities bilaterally limited to skin breakdown. Extensive lymphedema dried fluid on both lower extremities. Electronic Signature(s) Signed: 01/31/2021 2:32:16 PM By: Kalman Shan DO Entered By: Kalman Shan on 01/31/2021  13:40:06 -------------------------------------------------------------------------------- Physician Orders Details Patient Name: Date of Service: [name], [place]. 01/31/2021 1:00 PM Medical Record Number: [id number] Patient Account Number: [id number] Date of Birth/Sex: Treating RN: [birth year] (88 y.o. [name] Primary Care Provider: [name] Other Clinician: Referring Provider: Treating Provider/Extender: [name] in Treatment: 95 Verbal / Phone Orders: No Diagnosis Coding ICD-10 Coding Code Description C44.92 Squamous cell carcinoma of skin, unspecified L97.812 Non-pressure chronic ulcer of other part of right lower leg with fat layer exposed L97.822 Non-pressure chronic ulcer of other part of left lower leg with fat layer exposed I87.2 Venous insufficiency (chronic) (peripheral) Follow-up Appointments ppointment in 1 week. - Dr. Heber Rodeo Return A Bathing/ Shower/ Hygiene May shower with protection but do not get wound dressing(s) wet. - Use cast protector Edema Control - Lymphedema / SCD / Other Elevate legs to the level of the heart or above for 30 minutes daily and/or when sitting, a frequency of: - elevate the legs throughout the day heart level if possible. Avoid standing for long periods of time. Exercise regularly Additional Orders / Instructions Follow Nutritious Diet Wound Treatment Wound #15 - Lower Leg Wound Laterality: Right, Posterior Cleanser: Soap and Water 1 x Per Week/15 Days Discharge Instructions: May shower and wash wound with dial antibacterial soap and water prior to dressing change. Cleanser: Wound Cleanser 1 x Per Week/15 Days Discharge Instructions: Cleanse the wound with wound cleanser prior to applying a clean dressing using gauze sponges, not tissue or cotton balls. Peri-Wound Care: Triamcinolone 15 (g) 1 x Per Week/15 Days Discharge Instructions: In clinic only. Use triamcinolone 15 (g)  in  clinic only. Peri-Wound Care: Sween Lotion (Moisturizing lotion) 1 x Per Week/15 Days Discharge Instructions: Apply moisturizing lotion as directed Prim Dressing: PolyMem Silver Non-Adhesive Dressing, 4.25x4.25 in 1 x Per Week/15 Days ary Discharge Instructions: Apply to wound bed as instructed Secondary Dressing: Woven Gauze Sponge, Non-Sterile 4x4 in 1 x Per Week/15 Days Discharge Instructions: Apply over primary dressing as directed. Secondary Dressing: ABD Pad, 8x10 1 x Per Week/15 Days Discharge Instructions: Apply over primary dressing as directed. Compression Wrap: ThreePress (3 layer compression wrap) 1 x Per Week/15 Days Discharge Instructions: Apply three layer compression as directed. Wound #18 - Lower Leg Wound Laterality: Right, Medial, Proximal Cleanser: Soap and Water 1 x Per Week/15 Days Discharge Instructions: May shower and wash wound with dial antibacterial soap and water prior to dressing change. Cleanser: Wound Cleanser 1 x Per Week/15 Days Discharge Instructions: Cleanse the wound with wound cleanser prior to applying a clean dressing using gauze sponges, not tissue or cotton balls. Peri-Wound Care: Triamcinolone 15 (g) 1 x Per Week/15 Days Discharge Instructions: In clinic only. Use triamcinolone 15 (g) in clinic only. Peri-Wound Care: Sween Lotion (Moisturizing lotion) 1 x Per Week/15 Days Discharge Instructions: Apply moisturizing lotion as directed Prim Dressing: PolyMem Silver Non-Adhesive Dressing, 4.25x4.25 in 1 x Per Week/15 Days ary Discharge Instructions: Apply to wound bed as instructed Secondary Dressing: Woven Gauze Sponge, Non-Sterile 4x4 in 1 x Per Week/15 Days Discharge Instructions: Apply over primary dressing as directed. Secondary Dressing: ABD Pad, 8x10 1 x Per Week/15 Days Discharge Instructions: Apply over primary dressing as directed. Compression Wrap: ThreePress (3 layer compression wrap) 1 x Per Week/15 Days Discharge Instructions: Apply  three layer compression as directed. Wound #7R - Lower Leg Wound Laterality: Left, Circumferential Cleanser: Soap and Water 1 x Per Week/15 Days Discharge Instructions: May shower and wash wound with dial antibacterial soap and water prior to dressing change. Cleanser: Wound Cleanser 1 x Per Week/15 Days Discharge Instructions: Cleanse the wound with wound cleanser prior to applying a clean dressing using gauze sponges, not tissue or cotton balls. Peri-Wound Care: Triamcinolone 15 (g) 1 x Per Week/15 Days Discharge Instructions: In clinic only. Use triamcinolone 15 (g) in clinic only. Peri-Wound Care: Sween Lotion (Moisturizing lotion) 1 x Per Week/15 Days Discharge Instructions: Apply moisturizing lotion as directed Prim Dressing: PolyMem Silver Non-Adhesive Dressing, 4.25x4.25 in 1 x Per Week/15 Days ary Discharge Instructions: Apply to wound bed as instructed Secondary Dressing: Woven Gauze Sponge, Non-Sterile 4x4 in 1 x Per Week/15 Days Discharge Instructions: Apply over primary dressing as directed. Secondary Dressing: ABD Pad, 8x10 1 x Per Week/15 Days Discharge Instructions: Apply over primary dressing as directed. Compression Wrap: ThreePress (3 layer compression wrap) 1 x Per Week/15 Days Discharge Instructions: Apply three layer compression as directed. Electronic Signature(s) Signed: 01/31/2021 2:32:16 PM By: Kalman Shan DO Entered By: Kalman Shan on 01/31/2021 13:40:22 -------------------------------------------------------------------------------- Problem List Details Patient Name: Date of Service: [name], [place]. 01/31/2021 1:00 PM Medical Record Number: [id number] Patient Account Number: [id number] Date of Birth/Sex: Treating RN: 12-29-1925 (88 y.o. [name] Primary Care Provider: [name] Other Clinician: Referring Provider: Treating Provider/Extender: [name] in Treatment: 62 Active  Problems ICD-10 Encounter Code Description Active Date MDM Diagnosis C44.92 Squamous cell carcinoma of skin, unspecified 06/11/2020 No Yes L97.812 Non-pressure chronic ulcer of other part of right lower leg with fat layer 06/11/2020 No Yes exposed L97.822 Non-pressure chronic ulcer of other part of left lower leg  with fat layer exposed5/23/2022 No Yes I87.2 Venous insufficiency (chronic) (peripheral) 06/11/2020 No Yes I89.0 Lymphedema, not elsewhere classified 01/31/2021 No Yes Inactive Problems Resolved Problems Electronic Signature(s) Signed: 01/31/2021 2:32:16 PM By: Kalman Shan DO Entered By: Kalman Shan on 01/31/2021 13:45:06 -------------------------------------------------------------------------------- Progress Note Details Patient Name: Date of Service: [name], Simren A. 01/31/2021 1:00 PM Medical Record Number: [id number] Patient Account Number: [id number] Date of Birth/Sex: Treating RN: 10/01/1925 (88 y.o. [name] Primary Care Provider: [name] Other Clinician: Referring Provider: Treating Provider/Extender: [name] in Treatment: 80 Subjective Chief Complaint Information obtained from Patient Bilateral lower extremity wounds that have been biopsied and positive for squamous cell carcinoma History of Present Illness (HPI) The following HPI elements were documented for the patient's wound: Location: left leg Admission 5/23 Ms. 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 She states that the wound on the right has been present for a year. The 1 on the left has opened up 1 month ago. She is followed with oncology for this issue as she had biopsies that showed squamous cell carcinoma.  She is also seeing radiation oncology for treatment options. She presents today because she would like for her wounds to be healed by [place]. She currently denies signs of infection. 6/1; patient presents for 1 week follow-up. She states she has tolerated the leg wraps well. She states these do not bother her and is happy to continue with them. She is scheduled to see her oncologist today to go over treatment options for the bilateral lower extremity squamous cell carcinoma. Radiation is currently not a recommended option. Patient states she overall feels well. 6/22; patient presents for 3-week follow-up. She has tolerated the wraps well until her last wrap where she states they were uncomfortable. She attributes this to the home health nurse. She denies signs of infection. She has started her first treatment of antibody infusions for her Bilateral lower extremity squamous cell carcinoma. She has no complaints today. 7/21; patient presents for 1 month follow-up. Unfortunately she has not had good experience with her wrap changes with home health. She would like to do her own dressing changes. She continues to do her antibody infusions. She denies signs of infection. 7/28; patient presents for 1 week follow-up. At last clinic visit she was switched to daily dressing changes due to issues with the wrap and home health placing them. Unfortunately she has developed weeping to her legs bilaterally. She would like to be placed in wraps today. She would also like to follow with [place] weekly for wrap changes instead of having home health change them. She denies signs of infection. 8/4; patient presents for 1 week follow-up. She has tolerated the Kerlix/Coban wraps well. She no longer has weeping to her legs. She took the wrap off 1 day before coming in to be able to take a shower. She has no issues or complaints today. She denies signs of infection. 8/18; patient presents for follow-up. Patient has tolerated the  wraps well. She brought her Velcro compression wraps today. She has no issues or complaints today. She had her chemotherapy infusion yesterday without issues. She denies signs of infection. 8/25; patient presents for follow-up. She used her juxta lite compressions for the past week. It is unclear if she  is able to put these on correctly since she states she has a hard time getting them to look right. She reports 2 open wounds. She currently denies signs of infection. 9/1; patient presents for 1 week follow-up. She has 1 open wound. She tolerated the compression wraps well. She currently denies signs of infection. 9/8; patient presents for 1 week follow-up. She has 2 open wounds 1 on each leg. She has tolerated the compression wrap well. She currently denies signs of infection. 9/15; patient presents for 1 week follow-up. She now has 3 wounds. 2 on the left and 1 on the right. She continues to tolerate the compression wrap well. She currently denies signs of infection. She has obtained furosemide by her primary care physician and would like to discuss when to take this. 9/22; patient presents for 1 week follow-up. She has scattered wounds on her lower extremities bilaterally. She did take furosemide twice in the past week. She does not recall having to urinate more frequently. She tolerated the 3 layer compression well. She denies signs of infection. 10/13; patient has not been here recently because of [place]. Apparently the facility was only putting gauze on her legs. This is a patient I do not normally see. 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 Ronnald Ramp at [facility] dermatology. She is quite convincing that she did not have radiation to her lower legs. It is likely she also has significant chronic venous insufficiency stasis dermatitis. We have been using silver alginate under kerlix Coban. She has obvious open areas  medially on the right and areas on the left. She also has areas of extensive dry flaking adherent areas on the right and to a lesser extent on the left anterior. Nodular areas on the left lateral lower leg left posterior calf and right mid calf medially. 10/21; patient presents for follow-up. She has no issues or complaints today. She has tolerated the 3 layer compression well bilaterally. She denies signs of infection. 10/27; patient presents for follow-up. She continues to tolerate the 3 layer compression wrap well. She denies signs of infection. 11/30; patient presents for follow-up. She has no issues or complaints today. 11/10; patient arrived in clinic today for nurse visit accompanied by her daughter from [place]. The daughter had multiple questions so we turned this into doctors visit. Apparently after the last time I saw this woman in October she went to see Dr. Ronnald Ramp but he did not take the wraps off. She previously was treated with Cemiplimab for her squamous cell carcinoma. She did not receive radiation to her legs. I had wanted Dr. Ronnald Ramp to look at this because of the exceptionally damaged skin on her lower legs bilaterally. She has odd looking wounds on the left medial lower leg also extending posteriorly which we have not made a lot of progress on. But she also has a raised hyperkeratotic nodules on the right anterior tibial area plaques on the left medial lower thigh. These are not areas that are under compression. We have been using silver alginate on any open areas Liberal TCA under 3 layer compression. 11/17; patient presents for follow-up. She had her wraps taken off yesterday for her dermatology appointment. She reports excessive weeping to her legs bilaterally after the wraps were taken off. She currently denies signs of infection. 12/12/2020 upon evaluation today patient appears to be doing about the same in regard to her wounds. She was actually  started on Cipro after having  biopsies apparently at her dermatology clinic she does not have the results back from the actual biopsy but the culture did return and apparently they placed her on the Cipro she started that just this morning. Other than that her legs appear to be doing okay at this time. 12/1; patient presents for follow-up. She has no issues or complaints today. She has started Lasix 40 mg daily to help with her bilateral leg swelling. 12/8; patient presents for follow-up. She has no issues or complaints today. 12/15; patient presents for 1 week follow-up. She has no issues or complaints today. 12/22; patient presents for follow-up. She has no issues or complaints today. 1/5; patient presents for follow-up. She has no issues or complaints today. She has tolerated the compression wraps well. 1/12; patient presents for follow-up. She is tolerated the compression wrap well and has no issues or complaints today. She denies signs of infection. Patient History Information obtained from Patient. Family History Unknown History. Social History Never smoker, Marital Status - Single, Alcohol Use - Never, Drug Use - No History, Caffeine Use - Never. Medical History Eyes Denies history of Cataracts, Optic Neuritis Ear/Nose/Mouth/Throat Denies history of Chronic sinus problems/congestion, Middle ear problems Hematologic/Lymphatic Patient has history of Anemia Denies history of Hemophilia, Human Immunodeficiency Virus, Lymphedema, Sickle Cell Disease Respiratory Denies history of Aspiration, Asthma, Chronic Obstructive Pulmonary Disease (COPD), Pneumothorax, Sleep Apnea, Tuberculosis Cardiovascular Patient has history of Arrhythmia - Atrial Flutter, A fibb, Congestive Heart Failure, Hypertension Denies history of Angina, Coronary Artery Disease, Deep Vein Thrombosis, Hypotension, Myocardial Infarction, Peripheral Arterial Disease, Peripheral Venous Disease, Phlebitis, Vasculitis Gastrointestinal Patient has  history of Colitis Denies history of Cirrhosis , Crohnoos, Hepatitis A, Hepatitis B, Hepatitis C Endocrine Denies history of Type I Diabetes, Type II Diabetes Genitourinary Denies history of End Stage Renal Disease Immunological Denies history of Lupus Erythematosus, Raynaudoos, Scleroderma Integumentary (Skin) Denies history of History of Burn Musculoskeletal Patient has history of Osteoarthritis Denies history of Gout, Rheumatoid Arthritis, Osteomyelitis Neurologic Denies history of Dementia, Neuropathy, Quadriplegia, Paraplegia, Seizure Disorder Oncologic Denies history of Received Chemotherapy, Received Radiation Hospitalization/Surgery History - removal of rod left leg. 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 Vitals Time Taken: 12:51 PM, Height: 65 in, Weight: 163 lbs, BMI: 27.1, Temperature: 98.3 F, Pulse: 85 bpm, Respiratory Rate: 18 breaths/min, Blood Pressure: 119/80 mmHg. Cardiovascular 2+ dorsalis pedis/posterior tibialis pulses. Psychiatric pleasant and cooperative. General Notes: Scattered wounds to her lower extremities bilaterally limited to skin breakdown. Extensive lymphedema dried fluid on both lower extremities. Integumentary (Hair, Skin) Wound #15 status is Open. Original cause of wound was Gradually Appeared. The date acquired was: 11/08/2020. The wound has been in treatment 12 weeks. The wound is located on the Right,Posterior Lower Leg. The wound measures 0.2cm length x 0.2cm width x 0.1cm depth; 0.031cm^2 area and 0.003cm^3 volume. There is Fat Layer (Subcutaneous Tissue) exposed. There is no tunneling or undermining noted. There is a medium amount of serosanguineous drainage noted. The wound margin is distinct with the outline  attached to the wound base. There is large (67-100%) red, hyper - granulation within the wound bed. There is no necrotic tissue within the wound bed. Wound #18 status is Open. Original cause of wound was Blister. The date acquired was: 01/31/2021. The wound is located on the Right,Proximal,Medial Lower Leg. The wound measures 1cm length  x 2cm width x 0.1cm depth; 1.571cm^2 area and 0.157cm^3 volume. There is Fat Layer (Subcutaneous Tissue) exposed. There is no tunneling or undermining noted. There is a medium amount of serosanguineous drainage noted. The wound margin is flat and intact. There is large (67-100%) red, pink granulation within the wound bed. There is a small (1-33%) amount of necrotic tissue within the wound bed including Adherent Slough. Wound #7R status is Open. Original cause of wound was Blister. The date acquired was: 04/20/2020. The wound has been in treatment 33 weeks. The wound is located on the Left,Circumferential Lower Leg. The wound measures 3.6cm length x 14cm width x 0.1cm depth; 39.584cm^2 area and 3.958cm^3 volume. There is Fat Layer (Subcutaneous Tissue) exposed. There is no tunneling or undermining noted. There is a medium amount of serosanguineous drainage noted. The wound margin is distinct with the outline attached to the wound base. There is large (67-100%) red granulation within the wound bed. There is no necrotic tissue within the wound bed. Assessment Active Problems ICD-10 Squamous cell carcinoma of skin, unspecified Non-pressure chronic ulcer of other part of right lower leg with fat layer exposed Non-pressure chronic ulcer of other part of left lower leg with fat layer exposed Venous insufficiency (chronic) (peripheral) Patient's wounds are stable. She has extensive dried lymph fluid to her lower extremities bilaterally. I attempted to remove some of this. Some of these areas are macerated when the dried fluid is removed. We will continue to remove the dried  buildup every visit. For now we will try switching to PolyMem silver and see if this helps with healing the scattered wounds. Continue with compression wrap. Procedures Wound #15 Pre-procedure diagnosis of Wound #15 is a Venous Leg Ulcer located on the Right,Posterior Lower Leg . There was a Three Layer Compression Therapy Procedure by Levan Hurst, RN. Post procedure Diagnosis Wound #15: Same as Pre-Procedure Wound #18 Pre-procedure diagnosis of Wound #18 is a Venous Leg Ulcer located on the Right,Proximal,Medial Lower Leg . There was a Three Layer Compression Therapy Procedure by Levan Hurst, RN. Post procedure Diagnosis Wound #18: Same as Pre-Procedure Wound #7R Pre-procedure diagnosis of Wound #7R is a Malignant Wound located on the Left,Circumferential Lower Leg . There was a Three Layer Compression Therapy Procedure by Levan Hurst, RN. Post procedure Diagnosis Wound #7R: Same as Pre-Procedure Plan Follow-up Appointments: Return Appointment in 1 week. - Dr. Heber Stanhope Bathing/ Shower/ Hygiene: May shower with protection but do not get wound dressing(s) wet. - Use cast protector Edema Control - Lymphedema / SCD / Other: Elevate legs to the level of the heart or above for 30 minutes daily and/or when sitting, a frequency of: - elevate the legs throughout the day heart level if possible. Avoid standing for long periods of time. Exercise regularly Additional Orders / Instructions: Follow Nutritious Diet WOUND #15: - Lower Leg Wound Laterality: Right, Posterior Cleanser: Soap and Water 1 x Per Week/15 Days Discharge Instructions: May shower and wash wound with dial antibacterial soap and water prior to dressing change. Cleanser: Wound Cleanser 1 x Per Week/15 Days Discharge Instructions: Cleanse the wound with wound cleanser prior to applying a clean dressing using gauze sponges, not tissue or cotton balls. Peri-Wound Care: Triamcinolone 15 (g) 1 x Per Week/15 Days Discharge  Instructions: In clinic only. Use triamcinolone 15 (g) in clinic only. Peri-Wound Care: Sween Lotion (Moisturizing lotion) 1 x Per Week/15 Days Discharge Instructions: Apply moisturizing lotion as directed Prim Dressing: PolyMem Silver Non-Adhesive Dressing, 4.25x4.25 in 1 x Per  Week/15 Days ary Discharge Instructions: Apply to wound bed as instructed Secondary Dressing: Woven Gauze Sponge, Non-Sterile 4x4 in 1 x Per Week/15 Days Discharge Instructions: Apply over primary dressing as directed. Secondary Dressing: ABD Pad, 8x10 1 x Per Week/15 Days Discharge Instructions: Apply over primary dressing as directed. Com pression Wrap: ThreePress (3 layer compression wrap) 1 x Per Week/15 Days Discharge Instructions: Apply three layer compression as directed. WOUND #18: - Lower Leg Wound Laterality: Right, Medial, Proximal Cleanser: Soap and Water 1 x Per Week/15 Days Discharge Instructions: May shower and wash wound with dial antibacterial soap and water prior to dressing change. Cleanser: Wound Cleanser 1 x Per Week/15 Days Discharge Instructions: Cleanse the wound with wound cleanser prior to applying a clean dressing using gauze sponges, not tissue or cotton balls. Peri-Wound Care: Triamcinolone 15 (g) 1 x Per Week/15 Days Discharge Instructions: In clinic only. Use triamcinolone 15 (g) in clinic only. Peri-Wound Care: Sween Lotion (Moisturizing lotion) 1 x Per Week/15 Days Discharge Instructions: Apply moisturizing lotion as directed Prim Dressing: PolyMem Silver Non-Adhesive Dressing, 4.25x4.25 in 1 x Per Week/15 Days ary Discharge Instructions: Apply to wound bed as instructed Secondary Dressing: Woven Gauze Sponge, Non-Sterile 4x4 in 1 x Per Week/15 Days Discharge Instructions: Apply over primary dressing as directed. Secondary Dressing: ABD Pad, 8x10 1 x Per Week/15 Days Discharge Instructions: Apply over primary dressing as directed. Com pression Wrap: ThreePress (3 layer compression  wrap) 1 x Per Week/15 Days Discharge Instructions: Apply three layer compression as directed. WOUND #7R: - Lower Leg Wound Laterality: Left, Circumferential Cleanser: Soap and Water 1 x Per Week/15 Days Discharge Instructions: May shower and wash wound with dial antibacterial soap and water prior to dressing change. Cleanser: Wound Cleanser 1 x Per Week/15 Days Discharge Instructions: Cleanse the wound with wound cleanser prior to applying a clean dressing using gauze sponges, not tissue or cotton balls. Peri-Wound Care: Triamcinolone 15 (g) 1 x Per Week/15 Days Discharge Instructions: In clinic only. Use triamcinolone 15 (g) in clinic only. Peri-Wound Care: Sween Lotion (Moisturizing lotion) 1 x Per Week/15 Days Discharge Instructions: Apply moisturizing lotion as directed Prim Dressing: PolyMem Silver Non-Adhesive Dressing, 4.25x4.25 in 1 x Per Week/15 Days ary Discharge Instructions: Apply to wound bed as instructed Secondary Dressing: Woven Gauze Sponge, Non-Sterile 4x4 in 1 x Per Week/15 Days Discharge Instructions: Apply over primary dressing as directed. Secondary Dressing: ABD Pad, 8x10 1 x Per Week/15 Days Discharge Instructions: Apply over primary dressing as directed. Compression Wrap: ThreePress (3 layer compression wrap) 1 x Per Week/15 Days Discharge Instructions: Apply three layer compression as directed. 1. PolyMem silver under Kerlix/Coban 2. Follow-up in 1 week Electronic Signature(s) Signed: 01/31/2021 2:32:16 PM By: Kalman Shan DO Entered By: Kalman Shan on 01/31/2021 13:43:23 -------------------------------------------------------------------------------- HxROS Details Patient Name: Date of Service: [name], [place] A. 01/31/2021 1:00 PM Medical Record Number: [id number] Patient Account Number: [id number] Date of Birth/Sex: Treating RN: [birth year] (88 y.o. [name] Primary Care Provider: [name] Other Clinician: Referring  Provider: Treating Provider/Extender: [name] in Treatment: 77 Information Obtained From Patient Eyes Medical History: Negative for: Cataracts; Optic Neuritis Ear/Nose/Mouth/Throat Medical History: Negative for: Chronic sinus problems/congestion; Middle ear problems Hematologic/Lymphatic Medical History: Positive for: Anemia Negative for: Hemophilia; Human Immunodeficiency Virus; Lymphedema; Sickle Cell Disease Respiratory Medical History: Negative for: Aspiration; Asthma; Chronic Obstructive Pulmonary Disease (COPD); Pneumothorax; Sleep Apnea; Tuberculosis Cardiovascular Medical History: Positive for: Arrhythmia - Atrial Flutter, A fibb; Congestive Heart Failure; Hypertension Negative  for: Angina; Coronary Artery Disease; Deep Vein Thrombosis; Hypotension; Myocardial Infarction; Peripheral Arterial Disease; Peripheral Venous Disease; Phlebitis; Vasculitis Past Medical History Notes: hyperlipidemia Gastrointestinal Medical History: Positive for: Colitis Negative for: Cirrhosis ; Crohns; Hepatitis A; Hepatitis B; Hepatitis C Endocrine Medical History: Negative for: Type I Diabetes; Type II Diabetes Past Medical History Notes: hypothyroidism Genitourinary Medical History: Negative for: End Stage Renal Disease Immunological Medical History: Negative for: Lupus Erythematosus; Raynauds; Scleroderma Integumentary (Skin) Medical History: Negative for: History of Burn Musculoskeletal Medical History: Positive for: Osteoarthritis Negative for: Gout; Rheumatoid Arthritis; Osteomyelitis Neurologic Medical History: Negative for: Dementia; Neuropathy; Quadriplegia; Paraplegia; Seizure Disorder Past Medical History Notes: lumbar spindylolysis Oncologic Medical History: Negative for: Received Chemotherapy; Received Radiation Past Medical History Notes: BLE squamous ceel carcionoma Immunizations Pneumococcal Vaccine: Received  Pneumococcal Vaccination: Yes Received Pneumococcal Vaccination On or After 60th Birthday: No Implantable Devices None Hospitalization / Surgery History Type of Hospitalization/Surgery removal of rod left leg Family and Social History Unknown History: Yes; Never smoker; Marital Status - Single; Alcohol Use: Never; Drug Use: No History; Caffeine Use: Never; Financial Concerns: No; Food, Clothing or Shelter Needs: No; Support System Lacking: No; Transportation Concerns: No Electronic Signature(s) Signed: 01/31/2021 2:32:16 PM By: Kalman Shan DO Signed: 01/31/2021 6:02:53 PM By: Levan Hurst RN, BSN Entered By: Kalman Shan on 01/31/2021 13:38:06 -------------------------------------------------------------------------------- [place] Details Patient Name: Date of Service: [name], [place]. 01/31/2021 Medical Record Number: [id number] Patient Account Number: [id number] Date of Birth/Sex: Treating RN: February 11, 1925 (88 y.o. [name] Primary Care Provider: [name] Other Clinician: Referring Provider: Treating Provider/Extender: [name] in Treatment: 33 Diagnosis Coding ICD-10 Codes Code Description C44.92 Squamous cell carcinoma of skin, unspecified L97.812 Non-pressure chronic ulcer of other part of right lower leg with fat layer exposed L97.822 Non-pressure chronic ulcer of other part of left lower leg with fat layer exposed I87.2 Venous insufficiency (chronic) (peripheral) I89.0 Lymphedema, not elsewhere classified Facility Procedures CPT4: Code [id number] Description: 81 BILATERAL: Application of multi-layer venous compression system; leg (below knee), including ankle and t. Modifier: Quantity: 1 Physician Procedures : CPT4 Code Description Modifier [id number] - WC PHYS LEVEL 3 - EST PT ICD-10 Diagnosis Description S14.239 Non-pressure chronic ulcer of other part of right lower leg with fat layer exposed  L97.822 Non-pressure chronic ulcer of other part of left  lower leg with fat layer exposed I87.2 Venous insufficiency (chronic) (peripheral) I89.0 Lymphedema, not elsewhere classified Quantity: 1 Electronic Signature(s) Signed: 01/31/2021 6:02:53 PM By: Levan Hurst RN, BSN Signed: 02/01/2021 8:49:19 AM By: Kalman Shan DO Previous Signature: 01/31/2021 2:32:16 PM Version By: Kalman Shan DO Entered By: Levan Hurst on 01/31/2021 16:52:41

## 2021-02-01 NOTE — Progress Notes (Signed)
[name] ([id number]) , Visit Report for 01/31/2021 Arrival Information Details Patient Name: Date of Service: [name], Kuna. 01/31/2021 1:00 PM Medical Record Number: [id number] Patient Account Number: [id number] Date of Birth/Sex: Treating RN: [birth year] (88 y.o. [name] Primary Care Aalayah Riles: [name] Other Clinician: Referring Sabella Traore: Treating Alexio Sroka/Extender: [name] in Treatment: 59 Visit Information History Since Last Visit Added or deleted any medications: No Patient Arrived: Walker Any new allergies or adverse reactions: No Arrival Time: 12:51 Had a fall or experienced change in No Accompanied By: alone activities of daily living that may affect Transfer Assistance: None risk of falls: Patient Identification Verified: Yes Signs or symptoms of abuse/neglect since last visito No Secondary Verification Process Completed: Yes Hospitalized since last visit: No Patient Requires Transmission-Based No Implantable device outside of the clinic excluding No Precautions: cellular tissue based products placed in the center Patient Has Alerts: Yes since last visit: Patient Alerts: R ABI = Non compressible Has Dressing in Place as Prescribed: Yes L ABI = Non compressible Has Compression in Place as Prescribed: Yes Pain Present Now: No Electronic Signature(s) Signed: 01/31/2021 6:02:53 PM By: Levan Hurst RN, BSN Entered By: Levan Hurst on 01/31/2021 12:52:12 -------------------------------------------------------------------------------- Compression Therapy Details Patient Name: Date of Service: [name], [name] 01/31/2021 1:00 PM Medical Record Number: [id number] Patient Account Number: [id number] Date of Birth/Sex: Treating RN: July 28, 1925 (88 y.o. [name] Primary Care Giles Currie: [name] Other Clinician: Referring Jackson Fetters: Treating Trynity Skousen/Extender: [name] in Treatment: 70 Compression Therapy Performed for Wound Assessment: Wound #15 Right,Posterior Lower Leg Performed By: Clinician Levan Hurst, RN Compression Type: Three Layer Post Procedure Diagnosis Same as Pre-procedure Electronic Signature(s) Signed: 01/31/2021 6:02:53 PM By: Levan Hurst RN, BSN Entered By: Levan Hurst on 01/31/2021 13:30:20 -------------------------------------------------------------------------------- Compression Therapy Details Patient Name: Date of Service: [name], [name] 01/31/2021 1:00 PM Medical Record Number: [id number] Patient Account Number: [id number] Date of Birth/Sex: Treating RN: 1925-11-24 (88 y.o. [name] Primary Care Ingris Pasquarella: [name] Other Clinician: Referring Lisseth Brazeau: Treating Darling Cieslewicz/Extender: [name] in Treatment: 72 Compression Therapy Performed for Wound Assessment: Wound #18 Right,Proximal,Medial Lower Leg Performed By: Clinician Levan Hurst, RN Compression Type: Three Layer Post Procedure Diagnosis Same as Pre-procedure Electronic Signature(s) Signed: 01/31/2021 6:02:53 PM By: Levan Hurst RN, BSN Entered By: Levan Hurst on 01/31/2021 13:30:20 -------------------------------------------------------------------------------- Compression Therapy Details Patient Name: Date of Service: [name], [name] 01/31/2021 1:00 PM Medical Record Number: [id number] Patient Account Number: [id number] Date of Birth/Sex: Treating RN: [birth year] (88 y.o. [name] Primary Care Kahli Fitzgerald: [name] Other Clinician: Referring Rusti Arizmendi: Treating Lindwood Mogel/Extender: [name] in Treatment: 42 Compression Therapy Performed for Wound Assessment: Wound #7R Left,Circumferential Lower Leg Performed By: Clinician Levan Hurst, RN Compression Type: Three Layer Post Procedure Diagnosis Same as  Pre-procedure Electronic Signature(s) Signed: 01/31/2021 6:02:53 PM By: Levan Hurst RN, BSN Entered By: Levan Hurst on 01/31/2021 13:30:20 -------------------------------------------------------------------------------- Encounter Discharge Information Details Patient Name: Date of Service: [name], [name]. 01/31/2021 1:00 PM Medical Record Number: [id number] Patient Account Number: [id number] Date of Birth/Sex: Treating RN: [birth year] (88 y.o. [name] Primary Care Madlyn Crosby: [name] Other Clinician: Referring Daquane Aguilar: Treating Rajiv Parlato/Extender: [name] in Treatment: 15 Encounter Discharge Information Items Discharge Condition: Stable Ambulatory Status: Walker Discharge Destination: Home Transportation: Private Auto Accompanied By: alone Schedule Follow-up Appointment: Yes Clinical Summary of  Care: Patient Declined Electronic Signature(s) Signed: 01/31/2021 6:02:53 PM By: Levan Hurst RN, BSN Entered By: Levan Hurst on 01/31/2021 16:53:20 -------------------------------------------------------------------------------- Lower Extremity Assessment Details Patient Name: Date of Service: [name], [name]. 01/31/2021 1:00 PM Medical Record Number: [id number] Patient Account Number: [id number] Date of Birth/Sex: Treating RN: 1925/11/12 (88 y.o. America Brown Primary Care Kaedynce Tapp: [name] Other Clinician: Referring Onofrio Klemp: Treating Armond Cuthrell/Extender: [name] in Treatment: 33 Edema Assessment Assessed: [Left: No] [Right: No] Edema: [Left: Yes] [Right: Yes] Calf Left: Right: Point of Measurement: 30 cm From Medial Instep 32.5 cm 32.8 cm Ankle Left: Right: Point of Measurement: 9 cm From Medial Instep 22.6 cm 22.4 cm Electronic Signature(s) Signed: 02/01/2021 1:29:21 PM By: Dellie Catholic RN Entered By: Dellie Catholic on 01/31/2021  13:26:08 -------------------------------------------------------------------------------- Multi Wound Chart Details Patient Name: Date of Service: [name], [name] 01/31/2021 1:00 PM Medical Record Number: [id number] Patient Account Number: [id number] Date of Birth/Sex: Treating RN: 02/11/1925 (88 y.o. [name] Primary Care Nyeli Holtmeyer: [name] Other Clinician: Referring Kelyn Ponciano: Treating Liz Pinho/Extender: [name] in Treatment: 76 Vital Signs Height(in): 65 Pulse(bpm): 85 Weight(lbs): 163 Blood Pressure(mmHg): 119/80 Body Mass Index(BMI): 27 Temperature(F): 98.3 Respiratory Rate(breaths/min): 18 Photos: [15:Right, Posterior Lower Leg] [18:Right, Proximal, Medial Lower Leg] [7R:Left, Circumferential Lower Leg] Wound Location: [15:Gradually Appeared] [18:Blister] [7R:Blister] Wounding Event: [15:Venous Leg Ulcer] [18:Venous Leg Ulcer] [7R:Malignant Wound] Primary Etiology: [15:Anemia, Arrhythmia, Congestive Heart Anemia, Arrhythmia, Congestive Heart Anemia, Arrhythmia, Congestive Heart] Comorbid History: [15:Failure, Hypertension, Colitis, Osteoarthritis 11/08/2020] [18:Failure, Hypertension, Colitis, Osteoarthritis 01/31/2021] [7R:Failure, Hypertension, Colitis, Osteoarthritis 04/20/2020] Date Acquired: [15:12] [18:0] [7R:33] Weeks of Treatment: [15:Open] [18:Open] [7R:Open] Wound Status: [15:No] [18:No] [7R:Yes] Wound Recurrence: [15:Yes] [18:No] [7R:No] Clustered Wound: [15:0.2x0.2x0.1] [18:1x2x0.1] [7R:3.6x14x0.1] Measurements L x W x D (cm) [15:0.031] [18:1.571] [7R:39.584] A (cm) : rea [15:0.003] [18:0.157] [7R:3.958] Volume (cm) : [15:99.20%] [18:0.00%] [7R:-1189.00%] % Reduction in Area: [15:99.20%] [18:0.00%] [7R:-1189.30%] % Reduction in Volume: [15:Full Thickness Without Exposed] [18:Full Thickness Without Exposed] [7R:Full Thickness Without Exposed] Classification: [15:Support Structures Medium] [18:Support  Structures Medium] [7R:Support Structures Medium] Exudate Amount: [15:Serosanguineous] [18:Serosanguineous] [7R:Serosanguineous] Exudate Type: [15:red, brown] [18:red, brown] [7R:red, brown] Exudate Color: [15:Distinct, outline attached] [18:Flat and Intact] [7R:Distinct, outline attached] Wound Margin: [15:Large (67-100%)] [18:Large (67-100%)] [7R:Large (67-100%)] Granulation Amount: [15:Red, Hyper-granulation] [18:Red, Pink] [7R:Red] Granulation Quality: [15:None Present (0%)] [18:Small (1-33%)] [7R:None Present (0%)] Necrotic Amount: [15:Fat Layer (Subcutaneous Tissue): Yes Fat Layer (Subcutaneous Tissue): Yes Fat Layer (Subcutaneous Tissue): Yes] Exposed Structures: [15:Fascia: No Tendon: No Muscle: No Joint: No Bone: No Large (67-100%)] [18:Fascia: No Tendon: No Muscle: No Joint: No Bone: No None] [7R:Fascia: No Tendon: No Muscle: No Joint: No Bone: No Small (1-33%)] Epithelialization: [15:Compression Therapy] [18:Compression Therapy] [7R:Compression Therapy] Treatment Notes Electronic Signature(s) Signed: 01/31/2021 2:32:16 PM By: Kalman Shan DO Signed: 01/31/2021 6:02:53 PM By: Levan Hurst RN, BSN Entered By: Kalman Shan on 01/31/2021 13:36:15 -------------------------------------------------------------------------------- Multi-Disciplinary Care Plan Details Patient Name: Date of Service: [name], Franshesca A. 01/31/2021 1:00 PM Medical Record Number: [id number] Patient Account Number: [id number] Date of Birth/Sex: Treating RN: [birth year] (88 y.o. [name] Primary Care Kiva Norland: [name] Other Clinician: Referring Ladye Macnaughton: Treating Milta Croson/Extender: [name] in Treatment: 38 Multidisciplinary Care Plan reviewed with physician Active Inactive Venous Leg Ulcer Nursing Diagnoses: Knowledge deficit related to disease process and management Potential for venous Insuffiency (use before diagnosis  confirmed) Goals: Patient will maintain optimal edema control Date Initiated: 11/01/2020 Target Resolution Date: 02/28/2021 Goal Status:  Active Interventions: Assess peripheral edema status every visit. Compression as ordered Provide education on venous insufficiency Treatment Activities: Therapeutic compression applied : 11/01/2020 Notes: 12/27/20: Edema control ongoing 01/24/21: Edema control continues, using 3 layer compression Wound/Skin Impairment Nursing Diagnoses: Impaired tissue integrity Knowledge deficit related to ulceration/compromised skin integrity Goals: Patient/caregiver will verbalize understanding of skin care regimen Date Initiated: 06/11/2020 Target Resolution Date: 02/28/2021 Goal Status: Active Interventions: Assess patient/caregiver ability to obtain necessary supplies Assess patient/caregiver ability to perform ulcer/skin care regimen upon admission and as needed Assess ulceration(s) every visit Provide education on ulcer and skin care Notes: 10/11/20: Wound care regimen ongoing. 12/27/20: Wound care continues Electronic Signature(s) Signed: 01/31/2021 6:02:53 PM By: Levan Hurst RN, BSN Entered By: Levan Hurst on 01/31/2021 16:52:09 -------------------------------------------------------------------------------- Pain Assessment Details Patient Name: Date of Service: [name], Jenaya A. 01/31/2021 1:00 PM Medical Record Number: [id number] Patient Account Number: [id number] Date of Birth/Sex: Treating RN: [birth year] (88 y.o. [name] Primary Care Kobee Medlen: [name] Other Clinician: Referring Carrera Kiesel: Treating Chung Chagoya/Extender: [name] in Treatment: 88 Active Problems Location of Pain Severity and Description of Pain Patient Has Paino No Site Locations Pain Management and Medication Current Pain Management: Electronic Signature(s) Signed: 01/31/2021 6:02:53 PM By: Levan Hurst RN,  BSN Signed: 02/01/2021 1:29:21 PM By: Dellie Catholic RN Entered By: Dellie Catholic on 01/31/2021 13:17:33 -------------------------------------------------------------------------------- Patient/Caregiver Education Details Patient Name: Date of Service: [name], [place] 1/12/2023andnbsp1:00 PM Medical Record Number: [id number] Patient Account Number: [id number] Date of Birth/Gender: Treating RN: 07-27-1925 (88 y.o. [name] Primary Care Physician: [name] Other Clinician: Referring Physician: Treating Physician/Extender: [name] in Treatment: 69 Education Assessment Education Provided To: Patient Education Topics Provided Wound/Skin Impairment: Methods: Explain/Verbal Responses: State content correctly [employer]) Signed: 01/31/2021 6:02:53 PM By: Levan Hurst RN, BSN Entered By: Levan Hurst on 01/31/2021 16:52:27 -------------------------------------------------------------------------------- Wound Assessment Details Patient Name: Date of Service: [name], Tymesha A. 01/31/2021 1:00 PM Medical Record Number: [id number] Patient Account Number: [id number] Date of Birth/Sex: Treating RN: [birth year] (88 y.o. [name] Primary Care Josephus Harriger: [name] Other Clinician: Referring Taelynn Mcelhannon: Treating Shantrell Placzek/Extender: [name] in Treatment: 33 Wound Status Wound Number: 15 Primary Venous Leg Ulcer Etiology: Wound Location: Right, Posterior Lower Leg Wound Open Wounding Event: Gradually Appeared Status: Date Acquired: 11/08/2020 Comorbid Anemia, Arrhythmia, Congestive Heart Failure, Hypertension, Weeks Of Treatment: 12 History: Colitis, Osteoarthritis Clustered Wound: Yes Photos Wound Measurements Length: (cm) 0.2 Width: (cm) 0.2 Depth: (cm) 0.1 Area: (cm) 0.031 Volume: (cm) 0.003 % Reduction in Area: 99.2% % Reduction in Volume:  99.2% Epithelialization: Large (67-100%) Tunneling: No Undermining: No Wound Description Classification: Full Thickness Without Exposed Support Structures Wound Margin: Distinct, outline attached Exudate Amount: Medium Exudate Type: Serosanguineous Exudate Color: red, brown Foul Odor After Cleansing: No Slough/Fibrino No Wound Bed Granulation Amount: Large (67-100%) Exposed Structure Granulation Quality: Red, Hyper-granulation Fascia Exposed: No Necrotic Amount: None Present (0%) Fat Layer (Subcutaneous Tissue) Exposed: Yes Tendon Exposed: No Muscle Exposed: No Joint Exposed: No Bone Exposed: No Treatment Notes Wound #15 (Lower Leg) Wound Laterality: Right, Posterior Cleanser Soap and Water Discharge Instruction: May shower and wash wound with dial antibacterial soap and water prior to dressing change. Wound Cleanser Discharge Instruction: Cleanse the wound with wound cleanser prior to applying a clean dressing using gauze sponges, not tissue or cotton balls. Peri-Wound Care Triamcinolone 15 (g) Discharge Instruction: In clinic only. Use triamcinolone 15 (g) in clinic  only. Sween Lotion (Moisturizing lotion) Discharge Instruction: Apply moisturizing lotion as directed Topical Primary Dressing PolyMem Silver Non-Adhesive Dressing, 4.25x4.25 in Discharge Instruction: Apply to wound bed as instructed Secondary Dressing Woven Gauze Sponge, Non-Sterile 4x4 in Discharge Instruction: Apply over primary dressing as directed. ABD Pad, 8x10 Discharge Instruction: Apply over primary dressing as directed. Secured With Compression Wrap ThreePress (3 layer compression wrap) Discharge Instruction: Apply three layer compression as directed. Compression Stockings Add-Ons Electronic Signature(s) Signed: 01/31/2021 6:02:53 PM By: Levan Hurst RN, BSN Entered By: Levan Hurst on 01/31/2021  13:29:14 -------------------------------------------------------------------------------- Wound Assessment Details Patient Name: Date of Service: [name], [place]. 01/31/2021 1:00 PM Medical Record Number: [id number] Patient Account Number: [id number] Date of Birth/Sex: Treating RN: 1925/04/19 (88 y.o. [name] Primary Care Leonte Horrigan: [name] Other Clinician: Referring Lotta Franken[place]: Treating Hanya Guerin/Extender: [name] in Treatment: 51 Wound Status Wound Number: 18 Primary Venous Leg Ulcer Etiology: Wound Location: Right, Proximal, Medial Lower Leg Wound Open Wounding Event: Blister Status: Date Acquired: 01/31/2021 Comorbid Anemia, Arrhythmia, Congestive Heart Failure, Hypertension, Weeks Of Treatment: 0 History: Colitis, Osteoarthritis Clustered Wound: No Photos Wound Measurements Length: (cm) 1 Width: (cm) 2 Depth: (cm) 0.1 Area: (cm) 1.571 Volume: (cm) 0.157 % Reduction in Area: 0% % Reduction in Volume: 0% Epithelialization: None Tunneling: No Undermining: No Wound Description Classification: Full Thickness Without Exposed Support Structures Wound Margin: Flat and Intact Exudate Amount: Medium Exudate Type: Serosanguineous Exudate Color: red, brown Foul Odor After Cleansing: No Slough/Fibrino Yes Wound Bed Granulation Amount: Large (67-100%) Exposed Structure Granulation Quality: Red, Pink Fascia Exposed: No Necrotic Amount: Small (1-33%) Fat Layer (Subcutaneous Tissue) Exposed: Yes Necrotic Quality: Adherent Slough Tendon Exposed: No Muscle Exposed: No Joint Exposed: No Bone Exposed: No Treatment Notes Wound #18 (Lower Leg) Wound Laterality: Right, Medial, Proximal Cleanser Soap and Water Discharge Instruction: May shower and wash wound with dial antibacterial soap and water prior to dressing change. Wound Cleanser Discharge Instruction: Cleanse the wound with wound cleanser prior to applying a  clean dressing using gauze sponges, not tissue or cotton balls. Peri-Wound Care Triamcinolone 15 (g) Discharge Instruction: In clinic only. Use triamcinolone 15 (g) in clinic only. Sween Lotion (Moisturizing lotion) Discharge Instruction: Apply moisturizing lotion as directed Topical Primary Dressing PolyMem Silver Non-Adhesive Dressing, 4.25x4.25 in Discharge Instruction: Apply to wound bed as instructed Secondary Dressing Woven Gauze Sponge, Non-Sterile 4x4 in Discharge Instruction: Apply over primary dressing as directed. ABD Pad, 8x10 Discharge Instruction: Apply over primary dressing as directed. Secured With Compression Wrap ThreePress (3 layer compression wrap) Discharge Instruction: Apply three layer compression as directed. Compression Stockings Add-Ons Electronic Signature(s) Signed: 01/31/2021 6:02:53 PM By: Levan Hurst RN, BSN Entered By: Levan Hurst on 01/31/2021 13:29:57 -------------------------------------------------------------------------------- Wound Assessment Details Patient Name: Date of Service: [name], [place]. 01/31/2021 1:00 PM Medical Record Number: [id number] Patient Account Number: [id number] Date of Birth/Sex: Treating RN: 02-Jan-1926 (88 y.o. [name] Primary Care Ezekiah Massie: [name] Other Clinician: Referring Sabrina Keough: Treating Elizet Kaplan/Extender: [name] in Treatment: 52 Wound Status Wound Number: 7R Primary Malignant Wound Etiology: Wound Location: Left, Circumferential Lower Leg Wound Open Wounding Event: Blister Status: Date Acquired: 04/20/2020 Comorbid Anemia, Arrhythmia, Congestive Heart Failure, Hypertension, Weeks Of Treatment: 33 History: Colitis, Osteoarthritis Clustered Wound: No Photos Wound Measurements Length: (cm) 3.6 Width: (cm) 14 Depth: (cm) 0.1 Area: (cm) 39.584 Volume: (cm) 3.958 % Reduction in Area: -1189% % Reduction in Volume:  -1189.3% Epithelialization: Small (1-33%) Tunneling: No Undermining: No  Wound Description Classification: Full Thickness Without Exposed Support Structures Wound Margin: Distinct, outline attached Exudate Amount: Medium Exudate Type: Serosanguineous Exudate Color: red, brown Foul Odor After Cleansing: No Slough/Fibrino No Wound Bed Granulation Amount: Large (67-100%) Exposed Structure Granulation Quality: Red Fascia Exposed: No Necrotic Amount: None Present (0%) Fat Layer (Subcutaneous Tissue) Exposed: Yes Tendon Exposed: No Muscle Exposed: No Joint Exposed: No Bone Exposed: No Treatment Notes Wound #7R (Lower Leg) Wound Laterality: Left, Circumferential Cleanser Soap and Water Discharge Instruction: May shower and wash wound with dial antibacterial soap and water prior to dressing change. Wound Cleanser Discharge Instruction: Cleanse the wound with wound cleanser prior to applying a clean dressing using gauze sponges, not tissue or cotton balls. Peri-Wound Care Triamcinolone 15 (g) Discharge Instruction: In clinic only. Use triamcinolone 15 (g) in clinic only. Sween Lotion (Moisturizing lotion) Discharge Instruction: Apply moisturizing lotion as directed Topical Primary Dressing PolyMem Silver Non-Adhesive Dressing, 4.25x4.25 in Discharge Instruction: Apply to wound bed as instructed Secondary Dressing Woven Gauze Sponge, Non-Sterile 4x4 in Discharge Instruction: Apply over primary dressing as directed. ABD Pad, 8x10 Discharge Instruction: Apply over primary dressing as directed. Secured With Compression Wrap ThreePress (3 layer compression wrap) Discharge Instruction: Apply three layer compression as directed. Compression Stockings Add-Ons Electronic Signature(s) Signed: 01/31/2021 6:02:53 PM By: Levan Hurst RN, BSN Signed: 02/01/2021 1:29:21 PM By: Dellie Catholic RN Entered By: Dellie Catholic on 01/31/2021  13:22:58 -------------------------------------------------------------------------------- Vitals Details Patient Name: Date of Service: [name], [place]. 01/31/2021 1:00 PM Medical Record Number: [id number] Patient Account Number: [id number] Date of Birth/Sex: Treating RN: 1925/05/10 (88 y.o. [name] Primary Care Fawzi Melman: [name] Other Clinician: Referring Shakina Choy: Treating Saki Legore/Extender: [name] in Treatment: 16 Vital Signs Time Taken: 12:51 Temperature (F): 98.3 Height (in): 65 Pulse (bpm): 85 Weight (lbs): 163 Respiratory Rate (breaths/min): 18 Body Mass Index (BMI): 27.1 Blood Pressure (mmHg): 119/80 Reference Range: 80 - 120 mg / dl Electronic Signature(s) Signed: 01/31/2021 6:02:53 PM By: Levan Hurst RN, BSN Entered By: Levan Hurst on 01/31/2021 12:52:39

## 2021-02-03 NOTE — Telephone Encounter (Signed)
Okay. Thank you.

## 2021-02-05 ENCOUNTER — Other Ambulatory Visit: Payer: Self-pay

## 2021-02-05 ENCOUNTER — Encounter: Payer: Self-pay | Admitting: Internal Medicine

## 2021-02-05 ENCOUNTER — Ambulatory Visit (INDEPENDENT_AMBULATORY_CARE_PROVIDER_SITE_OTHER): Payer: Medicare Other | Admitting: Internal Medicine

## 2021-02-05 VITALS — BP 142/72 | HR 75 | Temp 98.3°F | Ht 65.0 in | Wt 145.6 lb

## 2021-02-05 DIAGNOSIS — I4891 Unspecified atrial fibrillation: Secondary | ICD-10-CM | POA: Diagnosis not present

## 2021-02-05 DIAGNOSIS — R739 Hyperglycemia, unspecified: Secondary | ICD-10-CM

## 2021-02-05 DIAGNOSIS — I10 Essential (primary) hypertension: Secondary | ICD-10-CM | POA: Diagnosis not present

## 2021-02-05 DIAGNOSIS — N183 Chronic kidney disease, stage 3 unspecified: Secondary | ICD-10-CM

## 2021-02-05 DIAGNOSIS — F4321 Adjustment disorder with depressed mood: Secondary | ICD-10-CM

## 2021-02-05 DIAGNOSIS — Z23 Encounter for immunization: Secondary | ICD-10-CM | POA: Diagnosis not present

## 2021-02-05 DIAGNOSIS — M545 Low back pain, unspecified: Secondary | ICD-10-CM

## 2021-02-05 LAB — COMPREHENSIVE METABOLIC PANEL
ALT: 17 U/L (ref 0–35)
AST: 21 U/L (ref 0–37)
Albumin: 3.8 g/dL (ref 3.5–5.2)
Alkaline Phosphatase: 75 U/L (ref 39–117)
BUN: 19 mg/dL (ref 6–23)
CO2: 30 mEq/L (ref 19–32)
Calcium: 9.9 mg/dL (ref 8.4–10.5)
Chloride: 100 mEq/L (ref 96–112)
Creatinine, Ser: 1.04 mg/dL (ref 0.40–1.20)
GFR: 46.41 mL/min — ABNORMAL LOW (ref >60.00)
Glucose, Bld: 103 mg/dL — ABNORMAL HIGH (ref 70–99)
Potassium: 3.9 mEq/L (ref 3.5–5.1)
Sodium: 138 mEq/L (ref 135–145)
Total Bilirubin: 0.9 mg/dL (ref 0.2–1.2)
Total Protein: 7.8 g/dL (ref 6.0–8.3)

## 2021-02-05 LAB — HEMOGLOBIN A1C: Hgb A1c MFr Bld: 6.1 % (ref 4.6–6.5)

## 2021-02-05 NOTE — Progress Notes (Signed)
Subjective:  Patient ID: [name], female    DOB: [birth year]  Age: 88 y.o. MRN: [id number]  CC: Follow-up (3 month f/u- See Daughter msg from 01/05/21- Wanting to know if she truly need prolia)   HPI ZAILEE VALLELY presents for LBP, gait disorder, HTN f/u Ernie Hew is in the memory unit. Lattie Haw - her gdtr died of cancer - Dec 27, 2020 - grieving  Outpatient Medications Prior to Visit  Medication Sig Dispense Refill   acetaminophen (TYLENOL) 500 MG tablet Take 500 mg by mouth daily as needed for mild pain.      apixaban (ELIQUIS) 5 MG TABS tablet TAKE 1 TABLET BY MOUTH TWICE DAILY 60 tablet 5   B Complex-C (B-COMPLEX WITH VITAMIN C) tablet Take 1 tablet by mouth daily.     busPIRone (BUSPAR) 15 MG tablet TAKE 1 TABLET BY MOUTH TWICE DAILY 180 tablet 3   Cholecalciferol (VITAMIN D3) 1000 UNITS tablet Take 1,000 Units by mouth daily.     colestipol (COLESTID) 1 g tablet Take 1 tablet (1 g total) by mouth in the morning. 60 tablet 12   diltiazem (CARDIZEM CD) 120 MG 24 hr capsule TAKE 1 CAPSULE (120 MG TOTAL) BY MOUTH DAILY. 90 capsule 3   doxycycline (VIBRA-TABS) 100 MG tablet TAKE 1 TABLET BY MOUTH ONCE DAILY 30 tablet 11   famotidine (PEPCID) 20 MG tablet Take 20 mg by mouth daily.     furosemide (LASIX) 40 MG tablet Take 1 tablet (40 mg total) by mouth daily as needed. 30 tablet 5   gabapentin (NEURONTIN) 100 MG capsule Take 1 capsule (100 mg total) by mouth at bedtime. Pain 90 capsule 3   hydrocortisone 2.5 % lotion Apply topically 3 (three) times daily. 240 mL 2   levothyroxine (SYNTHROID) 25 MCG tablet TAKE 1 TABLET BY MOUTH ONCE DAILY FOR THYROID *TAKE ON AN EMPTY STOMACH* 30 tablet 11   linaclotide (LINZESS) 290 MCG CAPS capsule TAKE 1 TABLET BY MOUTH ONCE DAILY *TAKE 30 MINUTES PRIOR TO FIRST MEAL* *TAKE ON AN EMPTY STOMACH* *DO NOT CRUSH OR CHEW* 30 capsule 5   loperamide (IMODIUM) 2 MG capsule Take 1 capsule (2 mg total) by mouth as needed for diarrhea or loose stools. Take 1 tab with  first loose stool then one tab after each loose stool for maximum dose of 8 tabs daily. Pt to call the office if need for greater then 8 tabs a day 60 capsule 0   methylPREDNISolone (MEDROL DOSEPAK) 4 MG TBPK tablet As directed 21 tablet 0   metoprolol succinate (TOPROL-XL) 50 MG 24 hr tablet TAKE 1/2 TABLET = 25 MG TWICE DAILY. TAKE WITH OR IMMEDIATELY FOLLOWING A MEAL 30 tablet 11   Multiple Vitamins-Minerals (MULTIVITAMIN WITH MINERALS) tablet Take 1 tablet by mouth daily.     polyethylene glycol (MIRALAX) 17 g packet Take 17 g by mouth daily. 30 each 0   potassium chloride SA (KLOR-CON) 20 MEQ tablet Take 1 tablet (20 mEq total) by mouth daily. Take along w/ Furosemide as need 30 tablet 3   prochlorperazine (COMPAZINE) 5 MG tablet Take 1 tablet (5 mg total) by mouth every 8 (eight) hours as needed for nausea or vomiting. 20 tablet 1   temazepam (RESTORIL) 15 MG capsule TAKE 1 CAPSULE BY MOUTH EVERY NIGHT AT BEDTIME AS NEEDED FOR SLEEP 60 capsule 3   No facility-administered medications prior to visit.    ROS: Review of Systems  Constitutional:  Negative for activity change, appetite change, chills,  fatigue and unexpected weight change.  HENT:  Negative for congestion, mouth sores and sinus pressure.   Eyes:  Negative for visual disturbance.  Respiratory:  Negative for cough and chest tightness.   Gastrointestinal:  Negative for abdominal pain and nausea.  Genitourinary:  Negative for difficulty urinating, frequency and vaginal pain.  Musculoskeletal:  Positive for arthralgias, back pain and gait problem.  Skin:  Negative for pallor and rash.  Neurological:  Negative for dizziness, tremors, weakness, numbness and headaches.  Psychiatric/Behavioral:  Negative for confusion, sleep disturbance and suicidal ideas.    Objective:  BP (!) 142/72 (BP Location: Left Arm)    Pulse 75    Temp 98.3 F (36.8 C) (Oral)    Ht 5\' 5"  (1.651 m)    Wt 145 lb 9.6 oz (66 kg)    SpO2 97%    BMI 24.23 kg/m    BP Readings from Last 3 Encounters:  02/05/21 (!) 142/72  11/07/20 (!) 151/69  10/31/20 122/62    Wt Readings from Last 3 Encounters:  02/05/21 145 lb 9.6 oz (66 kg)  11/07/20 156 lb 6.4 oz (70.9 kg)  10/31/20 149 lb 2 oz (67.6 kg)    Physical Exam Constitutional:      General: She is not in acute distress.    Appearance: She is well-developed.  HENT:     Head: Normocephalic.     Right Ear: External ear normal.     Left Ear: External ear normal.     Nose: Nose normal.  Eyes:     General:        Right eye: No discharge.        Left eye: No discharge.     Conjunctiva/sclera: Conjunctivae normal.     Pupils: Pupils are equal, round, and reactive to light.  Neck:     Thyroid: No thyromegaly.     Vascular: No JVD.     Trachea: No tracheal deviation.  Cardiovascular:     Rate and Rhythm: Normal rate and regular rhythm.     Heart sounds: Normal heart sounds.  Pulmonary:     Effort: No respiratory distress.     Breath sounds: No stridor. No wheezing.  Abdominal:     General: Bowel sounds are normal. There is no distension.     Palpations: Abdomen is soft. There is no mass.     Tenderness: There is no abdominal tenderness. There is no guarding or rebound.  Musculoskeletal:        General: No tenderness.     Cervical back: Normal range of motion and neck supple. No rigidity.  Lymphadenopathy:     Cervical: No cervical adenopathy.  Skin:    Findings: No erythema or rash.  Neurological:     Mental Status: She is oriented to person, place, and time.     Cranial Nerves: No cranial nerve deficit.     Motor: Weakness present. No abnormal muscle tone.     Coordination: Coordination abnormal.     Gait: Gait abnormal.     Deep Tendon Reflexes: Reflexes normal.  Psychiatric:        Behavior: Behavior normal.        Thought Content: Thought content normal.        Judgment: Judgment normal.   Using a walker Sad A/o/c  Lab Results  Component Value Date   WBC 6.0  11/07/2020   HGB 11.3 (L) 11/07/2020   HCT 33.6 (L) 11/07/2020   PLT 245 11/07/2020  GLUCOSE 183 (H) 11/07/2020   CHOL 116 06/25/2012   TRIG 82 09/05/2013   HDL 41.50 06/25/2012   LDLDIRECT 110.3 05/24/2008   LDLCALC 66 06/25/2012   ALT 18 11/07/2020   AST 22 11/07/2020   NA 136 11/07/2020   K 4.5 11/07/2020   CL 101 11/07/2020   CREATININE 0.86 11/07/2020   BUN 16 11/07/2020   CO2 23 11/07/2020   TSH 0.268 (L) 11/07/2020   INR 1.33 05/23/2017   HGBA1C 5.9 (H) 10/07/2018    VAS [place] ABI WITH/WO TBI  Result Date: 06/14/2020  LOWER EXTREMITY DOPPLER STUDY Patient Name:  MINNETTE MERIDA  Date of Exam:   06/14/2020 Medical Rec #: [id number]      Accession #:    [id number] Date of Birth: 11-Sep-1925     Patient Gender: F Patient Age:   89Y Exam Location:  [facility] Procedure:      VAS [place] ABI WITH/WO TBI Referring Phys: [id number] JESSICA RATLIFF HOFFMAN --------------------------------------------------------------------------------  Indications: Ulceration. High Risk Factors: Hypertension, no history of smoking.  Vascular Interventions: Chronic venous insufficiency. Limitations: Today's exam was limited due to bandages and an open wound. Performing Technologist: Delorise Shiner RVT  Examination Guidelines: A complete evaluation includes at minimum, Doppler waveform signals and systolic blood pressure reading at the level of bilateral brachial, anterior tibial, and posterior tibial arteries, when vessel segments are accessible. Bilateral testing is considered an integral part of a complete examination. Photoelectric Plethysmograph (PPG) waveforms and toe systolic pressure readings are included as required and additional duplex testing as needed. Limited examinations for reoccurring indications may be performed as noted.  ABI Findings: +---------+------------------+-----+---------+--------+  Right     Rt Pressure (mmHg) Index Waveform  Comment    +---------+------------------+-----+---------+--------+  Brachial  165                                          +---------+------------------+-----+---------+--------+  ATA       215                1.30                      +---------+------------------+-----+---------+--------+  PTA       191                1.16  triphasic           +---------+------------------+-----+---------+--------+  DP                                 biphasic            +---------+------------------+-----+---------+--------+  Great Toe 135                0.82                      +---------+------------------+-----+---------+--------+ +---------+------------------+-----+----------+-------+  Left      Lt Pressure (mmHg) Index Waveform   Comment  +---------+------------------+-----+----------+-------+  Brachial  164                                          +---------+------------------+-----+----------+-------+  ATA       187  1.13                      +---------+------------------+-----+----------+-------+  PTA       204                1.24  triphasic           +---------+------------------+-----+----------+-------+  DP                                 monophasic          +---------+------------------+-----+----------+-------+  Great Toe 160                0.97                      +---------+------------------+-----+----------+-------+ +-------+-----------+-----------+------------+------------+  ABI/TBI Today's ABI Today's TBI Previous ABI Previous TBI  +-------+-----------+-----------+------------+------------+  Right   1.30        0.82                                   +-------+-----------+-----------+------------+------------+  Left    1.24        0.97                                   +-------+-----------+-----------+------------+------------+   Summary: Right: Resting right ankle-brachial index is within normal range. No evidence of significant right lower extremity arterial disease. The right toe-brachial index is normal.  RT great toe pressure = 135 mmHg. Left: Resting left ankle-brachial index is within normal range. No evidence of significant left lower extremity arterial disease. The left toe-brachial index is normal. LT Great toe pressure = 160 mmHg.  *See table(s) above for measurements and observations.  Electronically signed by Deitra Mayo MD on 06/14/2020 at 4:34:25 PM.    Final     Assessment & Plan:   Problem List Items Addressed This Visit     Atrial fibrillation ([place])    On Eliquis, Toprol XL, Diltiazem      CRI (chronic renal insufficiency), stage 3 (moderate) ([address] [place] [address])    Hydrate yourself better Check CMET      Relevant Orders   Comprehensive metabolic panel   Hemoglobin A1c   Essential hypertension    Cont w/Metoprolol, Diltiazem GFR 43      Grief    Lattie Haw - her gdtr died of cancer - December 31, 2020 - grieving Pt declined counseling, antidepressant meds      Low back pain    Cont to use a walker Gabapentin at hs, Tylenol      Other Visit Diagnoses     Needs flu shot    -  Primary   Relevant Orders   Flu Vaccine QUAD High Dose(Fluad) (Completed)   Hyperglycemia       Relevant Orders   Comprehensive metabolic panel   Hemoglobin A1c         No orders of the defined types were placed in this encounter.     Follow-up: Return in about 3 months (around 05/06/2021) for a follow-up visit.  Walker Kehr, MD

## 2021-02-05 NOTE — Assessment & Plan Note (Signed)
On Eliquis, Toprol XL, Diltiazem

## 2021-02-05 NOTE — Assessment & Plan Note (Signed)
Cont to use a walker Gabapentin at hs, Tylenol

## 2021-02-05 NOTE — Assessment & Plan Note (Signed)
[name] - her gdtr died of cancer - December 17, 2020 - grieving Pt declined counseling, antidepressant meds

## 2021-02-05 NOTE — Assessment & Plan Note (Signed)
Hydrate yourself better Check CMET

## 2021-02-05 NOTE — Assessment & Plan Note (Signed)
Cont w/Metoprolol, Diltiazem GFR 43

## 2021-02-06 DIAGNOSIS — L280 Lichen simplex chronicus: Secondary | ICD-10-CM | POA: Diagnosis not present

## 2021-02-06 DIAGNOSIS — I872 Venous insufficiency (chronic) (peripheral): Secondary | ICD-10-CM | POA: Diagnosis not present

## 2021-02-06 DIAGNOSIS — I8311 Varicose veins of right lower extremity with inflammation: Secondary | ICD-10-CM | POA: Diagnosis not present

## 2021-02-06 DIAGNOSIS — I8312 Varicose veins of left lower extremity with inflammation: Secondary | ICD-10-CM | POA: Diagnosis not present

## 2021-02-06 DIAGNOSIS — Z85828 Personal history of other malignant neoplasm of skin: Secondary | ICD-10-CM | POA: Diagnosis not present

## 2021-02-06 NOTE — Telephone Encounter (Signed)
Noted. Thanks.

## 2021-02-07 ENCOUNTER — Encounter (HOSPITAL_BASED_OUTPATIENT_CLINIC_OR_DEPARTMENT_OTHER): Payer: Medicare Other | Admitting: Internal Medicine

## 2021-02-07 ENCOUNTER — Other Ambulatory Visit: Payer: Self-pay

## 2021-02-07 DIAGNOSIS — I890 Lymphedema, not elsewhere classified: Secondary | ICD-10-CM

## 2021-02-07 DIAGNOSIS — L97822 Non-pressure chronic ulcer of other part of left lower leg with fat layer exposed: Secondary | ICD-10-CM

## 2021-02-07 DIAGNOSIS — C4492 Squamous cell carcinoma of skin, unspecified: Secondary | ICD-10-CM | POA: Diagnosis not present

## 2021-02-07 DIAGNOSIS — Z85828 Personal history of other malignant neoplasm of skin: Secondary | ICD-10-CM | POA: Diagnosis not present

## 2021-02-07 DIAGNOSIS — L97812 Non-pressure chronic ulcer of other part of right lower leg with fat layer exposed: Secondary | ICD-10-CM

## 2021-02-07 DIAGNOSIS — I872 Venous insufficiency (chronic) (peripheral): Secondary | ICD-10-CM | POA: Diagnosis not present

## 2021-02-07 NOTE — Telephone Encounter (Signed)
Pt archived in [email or web address].  Please advise if patient and/or provider wish to proceed with Prolia therpay.  

## 2021-02-11 NOTE — Progress Notes (Signed)
[name] ([id number]) , Visit Report for 02/07/2021 Arrival Information Details Patient Name: Date of Service: [name], [name]. 02/07/2021 1:00 PM Medical Record Number: [id number] Patient Account Number: [id number] Date of Birth/Sex: Treating RN: [birth year] (88 y.o. [name] Primary Care [name]: [name] Other Clinician: Referring Dariyon Urquilla: Treating Anitta Tenny/Extender: [name] in Treatment: 27 Visit Information History Since Last Visit Added or deleted any medications: No Patient Arrived: Walker Any new allergies or adverse reactions: No Arrival Time: 13:11 Had a fall or experienced change in No Transfer Assistance: None activities of daily living that may affect Patient Identification Verified: Yes risk of falls: Secondary Verification Process Completed: Yes Signs or symptoms of abuse/neglect since last visito No Patient Requires Transmission-Based No Hospitalized since last visit: No Precautions: Implantable device outside of the clinic excluding No Patient Has Alerts: Yes cellular tissue based products placed in the center Patient Alerts: R ABI = Non compressible since last visit: L ABI = Non compressible Has Dressing in Place as Prescribed: Yes Has Compression in Place as Prescribed: No Pain Present Now: No Notes She states per her dermatologist yesterday that she could remove wraps and take shower. Electronic Signature(s) Signed: 02/07/2021 5:38:49 PM By: Lorrin Jackson Entered By: Lorrin Jackson on 02/07/2021 13:12:44 -------------------------------------------------------------------------------- Compression Therapy Details Patient Name: Date of Service: [name], [name] 02/07/2021 1:00 PM Medical Record Number: [id number] Patient Account Number: [id number] Date of Birth/Sex: Treating RN: [birth year] (88 y.o. [name] Primary Care Analayah Brooke: [name] Other Clinician: Referring  Merric Yost: Treating Julien Oscar/Extender: [name] in Treatment: 65 Compression Therapy Performed for Wound Assessment: Wound #19 Right,Circumferential Lower Leg Performed By: Clinician Levan Hurst, RN Compression Type: Three Layer Post Procedure Diagnosis Same as Pre-procedure Electronic Signature(s) Signed: 02/11/2021 5:11:23 PM By: Levan Hurst RN, BSN Entered By: Levan Hurst on 02/07/2021 14:03:44 -------------------------------------------------------------------------------- Compression Therapy Details Patient Name: Date of Service: [name], [name] 02/07/2021 1:00 PM Medical Record Number: [id number] Patient Account Number: [id number] Date of Birth/Sex: Treating RN: [birth year] (88 y.o. [name] Primary Care Bernadean Saling: [name] Other Clinician: Referring Donnarae Rae: Treating Montgomery Favor/Extender: [name] in Treatment: 58 Compression Therapy Performed for Wound Assessment: Wound #7R Left,Circumferential Lower Leg Performed By: Clinician Levan Hurst, RN Compression Type: Three Layer Post Procedure Diagnosis Same as Pre-procedure Electronic Signature(s) Signed: 02/11/2021 5:11:23 PM By: Levan Hurst RN, BSN Entered By: Levan Hurst on 02/07/2021 14:03:44 -------------------------------------------------------------------------------- Encounter Discharge Information Details Patient Name: Date of Service: [name], [name]. 02/07/2021 1:00 PM Medical Record Number: [id number] Patient Account Number: [id number] Date of Birth/Sex: Treating RN: 1925-07-02 (88 y.o. [name] Primary Care Apryll Hinkle: [name] Other Clinician: Referring Ayris Carano: Treating Jamal Pavon/Extender: [name] in Treatment: 13 Encounter Discharge Information Items Discharge Condition: Stable Ambulatory Status: Ambulatory Discharge Destination:  Home Transportation: Private Auto Accompanied By: alone Schedule Follow-up Appointment: Yes Clinical Summary of Care: Patient Declined Electronic Signature(s) Signed: 02/11/2021 5:11:23 PM By: Levan Hurst RN, BSN Entered By: Levan Hurst on 02/07/2021 17:10:59 -------------------------------------------------------------------------------- Lower Extremity Assessment Details Patient Name: Date of Service: [name], [place]. 02/07/2021 1:00 PM Medical Record Number: [id number] Patient Account Number: [id number] Date of Birth/Sex: Treating RN: 1925-10-06 (88 y.o. [name] Primary Care Eli Pattillo: [name] Other Clinician: Referring Shaunika Italiano: Treating Vona Whiters/Extender: Ludger Nutting Weeks in Treatment: 34 Edema Assessment Assessed: [Left: Yes] [Right: Yes] Edema: [Left: Yes] [Right: Yes] Calf  Left: Right: Point of Measurement: 30 cm From Medial Instep 39.5 cm 36 cm Ankle Left: Right: Point of Measurement: 9 cm From Medial Instep 24.5 cm 24 cm Vascular Assessment Pulses: Dorsalis Pedis Palpable: [Left:Yes] [Right:Yes] Electronic Signature(s) Signed: 02/07/2021 5:38:49 PM By: Lorrin Jackson Entered By: Lorrin Jackson on 02/07/2021 13:19:30 -------------------------------------------------------------------------------- Multi Wound Chart Details Patient Name: Date of Service: [name], [name] 02/07/2021 1:00 PM Medical Record Number: [id number] Patient Account Number: [id number] Date of Birth/Sex: Treating RN: 07/13/1925 (88 y.o. [name] Primary Care Shelitha Magley: [name] Other Clinician: Referring Damontay Alred: Treating Lashonne Shull/Extender: [name] in Treatment: 34 Vital Signs Height(in): 65 Pulse(bpm): 81 Weight(lbs): 163 Blood Pressure(mmHg): 113/71 Body Mass Index(BMI): 27 Temperature(F): 97.8 Respiratory Rate(breaths/min): 16 Photos: [15:No Photos] [18:No  Photos] Right, Posterior Lower Leg Right, Proximal, Medial Lower Leg Right, Circumferential Lower Leg Wound Location: Gradually Appeared Blister Blister Wounding Event: Venous Leg Ulcer Venous Leg Ulcer Venous Leg Ulcer Primary Etiology: N/A N/A Anemia, Arrhythmia, Congestive Heart Comorbid History: Failure, Hypertension, Colitis, Osteoarthritis 11/08/2020 01/31/2021 02/06/2021 Date Acquired: 13 1 0 Weeks of Treatment: Converted Converted Open Wound Status: No No No Wound Recurrence: Yes No Yes Clustered Wound: N/A N/A 27.5x23.5x0.5 Measurements L x W x D (cm) N/A N/A 507.564 A (cm) : rea N/A N/A 253.782 Volume (cm) : N/A N/A 0.00% % Reduction in Area: N/A N/A 0.00% % Reduction in Volume: Full Thickness Without Exposed Full Thickness Without Exposed Full Thickness Without Exposed Classification: Support Structures Support Structures Support Structures Medium Medium Medium Exudate A mount: Serosanguineous Serosanguineous Serosanguineous Exudate Type: red, brown red, brown red, brown Exudate Color: N/A N/A Distinct, outline attached Wound Margin: N/A N/A Large (67-100%) Granulation A mount: N/A N/A Red Granulation Quality: N/A N/A Small (1-33%) Necrotic A mount: N/A N/A None Epithelialization: N/A N/A Compression Therapy Procedures Performed: Wound Number: 7R N/A N/A Photos: N/A N/A Left, Circumferential Lower Leg N/A N/A Wound Location: Blister N/A N/A Wounding Event: Malignant Wound N/A N/A Primary Etiology: Anemia, Arrhythmia, Congestive Heart N/A N/A Comorbid History: Failure, Hypertension, Colitis, Osteoarthritis 04/20/2020 N/A N/A Date Acquired: 3 N/A N/A Weeks of Treatment: Open N/A N/A Wound Status: Yes N/A N/A Wound Recurrence: No N/A N/A Clustered Wound: 16.5x35.2x0.1 N/A N/A Measurements L x W x D (cm) 456.159 N/A N/A A (cm) : rea 45.616 N/A N/A Volume (cm) : -14753.80% N/A N/A % Reduction in Area: -14758.60% N/A N/A %  Reduction in Volume: Full Thickness Without Exposed N/A N/A Classification: Support Structures Medium N/A N/A Exudate Amount: Serosanguineous N/A N/A Exudate Type: red, brown N/A N/A Exudate Color: Distinct, outline attached N/A N/A Wound Margin: Large (67-100%) N/A N/A Granulation Amount: Red, Pink N/A N/A Granulation Quality: None Present (0%) N/A N/A Necrotic Amount: Fat Layer (Subcutaneous Tissue): Yes N/A N/A Exposed Structures: Fascia: No Tendon: No Muscle: No Joint: No Bone: No None N/A N/A Epithelialization: Compression Therapy N/A N/A Procedures Performed: Treatment Notes Electronic Signature(s) Signed: 02/07/2021 2:44:28 PM By: Kalman Shan DO Signed: 02/11/2021 5:11:23 PM By: Levan Hurst RN, BSN Entered By: Kalman Shan on 02/07/2021 14:36:06 -------------------------------------------------------------------------------- Multi-Disciplinary Care Plan Details Patient Name: Date of Service: [name], [name] 02/07/2021 1:00 PM Medical Record Number: [id number] Patient Account Number: [id number] Date of Birth/Sex: Treating RN: 05/09/1925 (88 y.o. [name] Primary Care Torell Minder: [name] Other Clinician: Referring Maddi Collar: Treating Shadow Schedler/Extender: [name] in Treatment: [place] reviewed with physician Active Inactive Venous Leg Ulcer Nursing Diagnoses: Knowledge deficit  related to disease process and management Potential for venous Insuffiency (use before diagnosis confirmed) Goals: Patient will maintain optimal edema control Date Initiated: 11/01/2020 Target Resolution Date: 02/28/2021 Goal Status: Active Interventions: Assess peripheral edema status every visit. Compression as ordered Provide education on venous insufficiency Treatment Activities: Therapeutic compression applied : 11/01/2020 Notes: 12/27/20: Edema control ongoing 01/24/21: Edema control  continues, using 3 layer compression Wound/Skin Impairment Nursing Diagnoses: Impaired tissue integrity Knowledge deficit related to ulceration/compromised skin integrity Goals: Patient/caregiver will verbalize understanding of skin care regimen Date Initiated: 06/11/2020 Target Resolution Date: 02/28/2021 Goal Status: Active Interventions: Assess patient/caregiver ability to obtain necessary supplies Assess patient/caregiver ability to perform ulcer/skin care regimen upon admission and as needed Assess ulceration(s) every visit Provide education on ulcer and skin care Notes: 10/11/20: Wound care regimen ongoing. 12/27/20: Wound care continues Electronic Signature(s) Signed: 02/11/2021 5:11:23 PM By: Levan Hurst RN, BSN Entered By: Levan Hurst on 02/07/2021 13:58:29 -------------------------------------------------------------------------------- Pain Assessment Details Patient Name: Date of Service: [name], [name] 02/07/2021 1:00 PM Medical Record Number: [id number] Patient Account Number: [id number] Date of Birth/Sex: Treating RN: July 27, 1925 (88 y.o. [name] Primary Care Kevork Joyce: [name] Other Clinician: Referring Lucas Winograd: Treating Refujio Haymer/Extender: [name] in Treatment: 10 Active Problems Location of Pain Severity and Description of Pain Patient Has Paino No Site Locations Pain Management and Medication Current Pain Management: Electronic Signature(s) Signed: 02/07/2021 5:38:49 PM By: Lorrin Jackson Entered By: Lorrin Jackson on 02/07/2021 13:14:00 -------------------------------------------------------------------------------- Patient/Caregiver Education Details Patient Name: Date of Service: [name], [name] 1/19/2023andnbsp1:00 PM Medical Record Number: [id number] Patient Account Number: [id number] Date of Birth/Gender: Treating RN: 05/08/1925 (88 y.o. [name] Primary Care Physician:  [name] Other Clinician: Referring Physician: Treating Physician/Extender: [name] in Treatment: 67 Education Assessment Education Provided To: Patient Education Topics Provided Wound/Skin Impairment: Methods: Explain/Verbal Responses: State content correctly [employer]) Signed: 02/11/2021 5:11:23 PM By: Levan Hurst RN, BSN Entered By: Levan Hurst on 02/07/2021 13:58:48 -------------------------------------------------------------------------------- Wound Assessment Details Patient Name: Date of Service: [name], [name] [place]. 02/07/2021 1:00 PM Medical Record Number: [id number] Patient Account Number: [id number] Date of Birth/Sex: Treating RN: 12-31-1925 (88 y.o. [name] Primary Care Aireanna Luellen: [name] Other Clinician: Referring Vassie Kugel: Treating Damonie Furney/Extender: [name] in Treatment: 34 Wound Status Wound Number: 15 Primary Etiology: Venous Leg Ulcer Wound Location: Right, Posterior Lower Leg Wound Status: Converted Wounding Event: Gradually Appeared Date Acquired: 11/08/2020 Weeks Of Treatment: 13 Clustered Wound: Yes Wound Description Classification: Full Thickness Without Exposed Support Struct Exudate Amount: Medium Exudate Type: Serosanguineous Exudate Color: red, brown ures Treatment Notes Wound #15 (Lower Leg) Wound Laterality: Right, Posterior Cleanser Peri-Wound Care Topical Primary Dressing Secondary Dressing Secured With Compression Wrap Compression Stockings Add-Ons Electronic Signature(s) Signed: 02/07/2021 5:38:49 PM By: Lorrin Jackson Entered By: Lorrin Jackson on 02/07/2021 13:15:07 -------------------------------------------------------------------------------- Wound Assessment Details Patient Name: Date of Service: [name], [name] 02/07/2021 1:00 PM Medical Record Number: [id number] Patient Account Number:  [id number] Date of Birth/Sex: Treating RN: [birth year] (88 y.o. [name] Primary Care Shani Fitch: [name] Other Clinician: Referring Erynn Vaca: Treating Kathrine Rieves/Extender: [name] in Treatment: 34 Wound Status Wound Number: 18 Primary Etiology: Venous Leg Ulcer Wound Location: Right, Proximal, Medial Lower Leg Wound Status: Converted Wounding Event: Blister Date Acquired: 01/31/2021 Weeks Of Treatment: 1 Clustered Wound: No Wound Description Classification: Full Thickness Without Exposed Support Struct Exudate Amount: Medium Exudate Type:  Serosanguineous Exudate Color: red, brown ures Treatment Notes Wound #18 (Lower Leg) Wound Laterality: Right, Medial, Proximal Cleanser Peri-Wound Care Topical Primary Dressing Secondary Dressing Secured With Compression Wrap Compression Stockings Add-Ons Electronic Signature(s) Signed: 02/07/2021 5:38:49 PM By: Lorrin Jackson Entered By: Lorrin Jackson on 02/07/2021 13:15:08 -------------------------------------------------------------------------------- Wound Assessment Details Patient Name: Date of Service: [name], [name] 02/07/2021 1:00 PM Medical Record Number: [id number] Patient Account Number: [id number] Date of Birth/Sex: Treating RN: [birth year] (88 y.o. [name] Primary Care Briceyda Abdullah: [name] Other Clinician: Referring Evett Kassa: Treating Deagen Krass/Extender: [name] in Treatment: 34 Wound Status Wound Number: 19 Primary Venous Leg Ulcer Etiology: Wound Location: Right, Circumferential Lower Leg Wound Open Wounding Event: Blister Status: Date Acquired: 02/06/2021 Comorbid Anemia, Arrhythmia, Congestive Heart Failure, Hypertension, Weeks Of Treatment: 0 History: Colitis, Osteoarthritis Clustered Wound: Yes Photos Wound Measurements Length: (cm) 27.5 Width: (cm) 23.5 Depth: (cm) 0.5 Area: (cm)  507.564 Volume: (cm) 253.782 % Reduction in Area: 0% % Reduction in Volume: 0% Epithelialization: None Tunneling: No Undermining: No Wound Description Classification: Full Thickness Without Exposed Support Structures Wound Margin: Distinct, outline attached Exudate Amount: Medium Exudate Type: Serosanguineous Exudate Color: red, brown Foul Odor After Cleansing: No Slough/Fibrino Yes Wound Bed Granulation Amount: Large (67-100%) Exposed Structure Granulation Quality: Red Fascia Exposed: No Necrotic Amount: Small (1-33%) Fat Layer (Subcutaneous Tissue) Exposed: Yes Necrotic Quality: Adherent Slough Tendon Exposed: No Muscle Exposed: No Joint Exposed: No Bone Exposed: No Treatment Notes Wound #19 (Lower Leg) Wound Laterality: Right, Circumferential Cleanser Soap and Water Discharge Instruction: May shower and wash wound with dial antibacterial soap and water prior to dressing change. Wound Cleanser Discharge Instruction: Cleanse the wound with wound cleanser prior to applying a clean dressing using gauze sponges, not tissue or cotton balls. Peri-Wound Care Triamcinolone 15 (g) Discharge Instruction: In clinic only. Use triamcinolone 15 (g) in clinic only. Sween Lotion (Moisturizing lotion) Discharge Instruction: Apply moisturizing lotion as directed Topical Primary Dressing KerraCel Ag Gelling Fiber Dressing, 4x5 in (silver alginate) Discharge Instruction: Apply silver alginate to wound bed as instructed Secondary Dressing Woven Gauze Sponge, Non-Sterile 4x4 in Discharge Instruction: Apply over primary dressing as directed. ABD Pad, 8x10 Discharge Instruction: Apply over primary dressing as directed. Secured With Compression Wrap ThreePress (3 layer compression wrap) Discharge Instruction: Apply three layer compression as directed. Compression Stockings Add-Ons Electronic Signature(s) Signed: 02/07/2021 2:00:28 PM By: Sandre Kitty Signed: 02/07/2021 5:38:49 PM  By: Lorrin Jackson Entered By: Sandre Kitty on 02/07/2021 13:20:42 -------------------------------------------------------------------------------- Wound Assessment Details Patient Name: Date of Service: [name], [name] 02/07/2021 1:00 PM Medical Record Number: [id number] Patient Account Number: [id number] Date of Birth/Sex: Treating RN: 16-Aug-1925 (88 y.o. [name] Primary Care Gwenda Heiner: [name] Other Clinician: Referring Kasheem Toner: Treating Rondal Vandevelde/Extender: [name] in Treatment: 34 Wound Status Wound Number: 7R Primary Malignant Wound Etiology: Wound Location: Left, Circumferential Lower Leg Wound Open Wounding Event: Blister Status: Date Acquired: 04/20/2020 Comorbid Anemia, Arrhythmia, Congestive Heart Failure, Hypertension, Weeks Of Treatment: 34 History: Colitis, Osteoarthritis Clustered Wound: No Photos Wound Measurements Length: (cm) 16.5 Width: (cm) 35.2 Depth: (cm) 0.1 Area: (cm) 456.159 Volume: (cm) 45.616 % Reduction in Area: -14753.8% % Reduction in Volume: -14758.6% Epithelialization: None Tunneling: No Undermining: No Wound Description Classification: Full Thickness Without Exposed Support Structures Wound Margin: Distinct, outline attached Exudate Amount: Medium Exudate Type: Serosanguineous Exudate Color: red, brown Foul Odor After Cleansing: No Slough/Fibrino No Wound Bed Granulation Amount: Large (67-100%) Exposed Structure Granulation Quality:  Red, Pink Fascia Exposed: No Necrotic Amount: None Present (0%) Fat Layer (Subcutaneous Tissue) Exposed: Yes Tendon Exposed: No Muscle Exposed: No Joint Exposed: No Bone Exposed: No Treatment Notes Wound #7R (Lower Leg) Wound Laterality: Left, Circumferential Cleanser Soap and Water Discharge Instruction: May shower and wash wound with dial antibacterial soap and water prior to dressing change. Wound Cleanser Discharge Instruction:  Cleanse the wound with wound cleanser prior to applying a clean dressing using gauze sponges, not tissue or cotton balls. Peri-Wound Care Triamcinolone 15 (g) Discharge Instruction: In clinic only. Use triamcinolone 15 (g) in clinic only. Sween Lotion (Moisturizing lotion) Discharge Instruction: Apply moisturizing lotion as directed Topical Primary Dressing KerraCel Ag Gelling Fiber Dressing, 4x5 in (silver alginate) Discharge Instruction: Apply silver alginate to wound bed as instructed Secondary Dressing Woven Gauze Sponge, Non-Sterile 4x4 in Discharge Instruction: Apply over primary dressing as directed. ABD Pad, 8x10 Discharge Instruction: Apply over primary dressing as directed. Secured With Compression Wrap ThreePress (3 layer compression wrap) Discharge Instruction: Apply three layer compression as directed. Compression Stockings Add-Ons Electronic Signature(s) Signed: 02/07/2021 2:00:28 PM By: Sandre Kitty Signed: 02/07/2021 5:38:49 PM By: Lorrin Jackson Entered By: Sandre Kitty on 02/07/2021 13:21:27 -------------------------------------------------------------------------------- Vitals Details Patient Name: Date of Service: [name], [name] 02/07/2021 1:00 PM Medical Record Number: [id number] Patient Account Number: [id number] Date of Birth/Sex: Treating RN: 1925-03-11 (88 y.o. [name] Primary Care Michall Noffke: [name] Other Clinician: Referring Vendetta Pittinger: Treating Liela Rylee/Extender: [name] in Treatment: 57 Vital Signs Time Taken: 13:13 Temperature (F): 97.8 Height (in): 65 Pulse (bpm): 82 Weight (lbs): 163 Respiratory Rate (breaths/min): 16 Body Mass Index (BMI): 27.1 Blood Pressure (mmHg): 113/71 Reference Range: 80 - 120 mg / dl Electronic Signature(s) Signed: 02/07/2021 5:38:49 PM By: Lorrin Jackson Entered By: Lorrin Jackson on 02/07/2021 13:13:47

## 2021-02-11 NOTE — Progress Notes (Signed)
[name] ([id number]) , Visit Report for 02/07/2021 Chief Complaint Document Details Patient Name: Date of Service: [name], Joiner. 02/07/2021 1:00 PM Medical Record Number: [id number] Patient Account Number: [id number] Date of Birth/Sex: Treating RN: 23-Feb-1925 (88 y.o. [name] Primary Care Provider: [name] Other Clinician: Referring Provider: Treating Provider/Extender: [name] in Treatment: 75 Information Obtained from: Patient Chief Complaint Bilateral lower extremity wounds that have been biopsied and positive for squamous cell carcinoma Electronic Signature(s) Signed: 02/07/2021 2:44:28 PM By: Kalman Shan DO Entered By: Kalman Shan on 02/07/2021 14:36:15 -------------------------------------------------------------------------------- HPI Details Patient Name: Date of Service: [name], [name] 02/07/2021 1:00 PM Medical Record Number: [id number] Patient Account Number: [id number] Date of Birth/Sex: Treating RN: 03/18/1925 (88 y.o. [name] Primary Care Provider: [name] Other Clinician: Referring Provider: Treating Provider/Extender: [name] in Treatment: 68 History of Present Illness Location: left leg HPI Description: Admission 5/23 Ms. 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 She states that the wound on the right has been present for a year. The 1 on the left has opened up 1 month ago. She is followed with oncology for this issue as she had biopsies that showed squamous cell carcinoma. She is also seeing radiation oncology for treatment options. She presents today because she would like for her wounds to be healed  by [place]. She currently denies signs of infection. 6/1; patient presents for 1 week follow-up. She states she has tolerated the leg wraps well. She states these do not bother her and is happy to continue with them. She is scheduled to see her oncologist today to go over treatment options for the bilateral lower extremity squamous cell carcinoma. Radiation is currently not a recommended option. Patient states she overall feels well. 6/22; patient presents for 3-week follow-up. She has tolerated the wraps well until her last wrap where she states they were uncomfortable. She attributes this to the home health nurse. She denies signs of infection. She has started her first treatment of antibody infusions for her Bilateral lower extremity squamous cell carcinoma. She has no complaints today. 7/21; patient presents for 1 month follow-up. Unfortunately she has not had good experience with her wrap changes with home health. She would like to do her own dressing changes. She continues to do her antibody infusions. She denies signs of infection. 7/28; patient presents for 1 week follow-up. At last clinic visit she was switched to daily dressing changes due to issues with the wrap and home health placing them. Unfortunately she has developed weeping to her legs bilaterally. She would like to be placed in wraps today. She would also like to follow with [place] weekly for wrap changes instead of having home health change them. She denies signs of infection. 8/4; patient presents for 1 week follow-up. She has tolerated the Kerlix/Coban wraps well. She no longer has weeping to her legs. She took the wrap off 1 day before coming in to be able to take a shower. She has no issues or complaints today. She denies signs of infection. 8/18; patient presents for follow-up. Patient has tolerated the wraps well. She brought her Velcro compression wraps today. She has no issues or complaints today. She had her chemotherapy infusion  yesterday without issues. She denies signs of infection. 8/25; patient presents for follow-up. She used her juxta lite compressions for the past week. It is unclear if she is able to put these on correctly since she states she has a hard time getting them to look right. She reports 2 open wounds. She currently denies signs of infection. 9/1; patient presents for 1 week follow-up. She has 1 open wound. She tolerated the compression wraps well. She currently denies signs of infection. 9/8; patient presents for 1 week follow-up. She has 2 open wounds 1 on each leg. She has tolerated the compression wrap well. She currently denies signs of infection. 9/15; patient presents for 1 week follow-up. She now has 3 wounds. 2 on the left and 1 on the right. She continues to tolerate the compression wrap well. She currently denies signs of infection. She has obtained furosemide by her primary care physician and would like to discuss when to take this. 9/22; patient presents for 1 week follow-up. She has scattered wounds on her lower extremities bilaterally. She did take furosemide twice in the past week. She does not recall having to urinate more frequently. She tolerated the 3 layer compression well. She denies signs of infection. 10/13; patient has not been here recently because of [place]. Apparently the facility was only putting gauze on her legs. This is a patient I do not normally see. 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 Ronnald Ramp at [facility] dermatology. She is quite convincing that she did not have radiation to her lower legs. It is likely she also has significant chronic venous insufficiency stasis dermatitis. We have been using silver alginate under kerlix Coban. She has obvious open areas medially on the right and areas on the left. She also has areas of extensive dry flaking adherent areas on the right and to a lesser  extent on the left anterior. Nodular areas on the left lateral lower leg left posterior calf and right mid calf medially. 10/21; patient presents for follow-up. She has no issues or complaints today. She has tolerated the 3 layer compression well bilaterally. She denies signs of infection. 10/27; patient presents for follow-up. She continues to tolerate the 3 layer compression wrap well. She denies signs of infection. 11/30; patient presents for follow-up. She has no issues or complaints today. 11/10; patient arrived in clinic today for nurse visit accompanied by her daughter from [place]. The daughter had multiple questions so we turned this into doctors visit. Apparently after the last time I saw this woman in October she went to see Dr. Ronnald Ramp but he did not take the wraps off. She previously was treated with Cemiplimab for her squamous cell carcinoma. She did not receive radiation to her legs. I had wanted Dr. Ronnald Ramp to look at this because of the exceptionally damaged skin on her lower legs bilaterally. She has odd looking wounds on the left medial lower leg also extending posteriorly which we have not made a lot of progress on. But she also has a raised hyperkeratotic nodules on the right anterior tibial area plaques on the left medial lower thigh. These are not areas that are under compression. We have been using silver alginate on any open areas Liberal TCA under 3 layer compression. 11/17; patient presents for follow-up. She had her wraps taken off yesterday for her dermatology appointment. She reports excessive weeping to her legs bilaterally after the wraps were  taken off. She currently denies signs of infection. 12/12/2020 upon evaluation today patient appears to be doing about the same in regard to her wounds. She was actually started on Cipro after having biopsies apparently at her dermatology clinic she does not have the results back from the actual biopsy but the culture did return and  apparently they placed her on the Cipro she started that just this morning. Other than that her legs appear to be doing okay at this time. 12/1; patient presents for follow-up. She has no issues or complaints today. She has started Lasix 40 mg daily to help with her bilateral leg swelling. 12/8; patient presents for follow-up. She has no issues or complaints today. 12/15; patient presents for 1 week follow-up. She has no issues or complaints today. 12/22; patient presents for follow-up. She has no issues or complaints today. 1/5; patient presents for follow-up. She has no issues or complaints today. She has tolerated the compression wraps well. 1/12; patient presents for follow-up. She is tolerated the compression wrap well and has no issues or complaints today. She denies signs of infection. 1/19; patient presents for follow-up. She took the compression wraps off 1 to 2 days ago to take a shower and did not have compression wraps replaced. She reports increased blistering throughout her legs bilaterally. She currently denies signs of infection. Electronic Signature(s) Signed: 02/07/2021 2:44:28 PM By: Kalman Shan DO Entered By: Kalman Shan on 02/07/2021 14:36:51 -------------------------------------------------------------------------------- Physical Exam Details Patient Name: Date of Service: [name], [name] 02/07/2021 1:00 PM Medical Record Number: [id number] Patient Account Number: [id number] Date of Birth/Sex: Treating RN: 05/12/1925 (88 y.o. [name] Primary Care Provider: [name] Other Clinician: Referring Provider: Treating Provider/Extender: [name] in Treatment: 61 Constitutional respirations regular, non-labored and within target range for patient.. Cardiovascular 2+ dorsalis pedis/posterior tibialis pulses. Psychiatric pleasant and cooperative. Notes Scattered wounds to her lower extremities bilaterally  limited to skin breakdown. Several blisters to her legs bilaterally with serous fluid that easily opened. A suspicious lesion to the right anterior leg where She previously had squamous cell carcinoma. Electronic Signature(s) Signed: 02/07/2021 2:44:28 PM By: Kalman Shan DO Entered By: Kalman Shan on 02/07/2021 14:38:24 -------------------------------------------------------------------------------- Physician Orders Details Patient Name: Date of Service: [name], Dawson. 02/07/2021 1:00 PM Medical Record Number: [id number] Patient Account Number: [id number] Date of Birth/Sex: Treating RN: Aug 09, 1925 (88 y.o. [name] Primary Care Provider: [name] Other Clinician: Referring Provider: Treating Provider/Extender: [name] in Treatment: 69 Verbal / Phone Orders: No Diagnosis Coding ICD-10 Coding Code Description C44.92 Squamous cell carcinoma of skin, unspecified L97.812 Non-pressure chronic ulcer of other part of right lower leg with fat layer exposed L97.822 Non-pressure chronic ulcer of other part of left lower leg with fat layer exposed I87.2 Venous insufficiency (chronic) (peripheral) I89.0 Lymphedema, not elsewhere classified Follow-up Appointments ppointment in 1 week. - Dr. Heber Wildwood Return A Bathing/ Shower/ Hygiene May shower with protection but do not get wound dressing(s) wet. - Use cast protector Edema Control - Lymphedema / SCD / Other Elevate legs to the level of the heart or above for 30 minutes daily and/or when sitting, a frequency of: - elevate the legs throughout the day heart level if possible. Avoid standing for long periods of time. Exercise regularly Additional Orders / Instructions Follow Nutritious Diet Wound Treatment Wound #19 - Lower Leg Wound Laterality: Right, Circumferential Cleanser: Soap and Water 1 x Per Week/15 Days Discharge  Instructions: May shower and wash wound with dial  antibacterial soap and water prior to dressing change. Cleanser: Wound Cleanser 1 x Per Week/15 Days Discharge Instructions: Cleanse the wound with wound cleanser prior to applying a clean dressing using gauze sponges, not tissue or cotton balls. Peri-Wound Care: Triamcinolone 15 (g) 1 x Per Week/15 Days Discharge Instructions: In clinic only. Use triamcinolone 15 (g) in clinic only. Peri-Wound Care: Sween Lotion (Moisturizing lotion) 1 x Per Week/15 Days Discharge Instructions: Apply moisturizing lotion as directed Prim Dressing: KerraCel Ag Gelling Fiber Dressing, 4x5 in (silver alginate) 1 x Per Week/15 Days ary Discharge Instructions: Apply silver alginate to wound bed as instructed Secondary Dressing: Woven Gauze Sponge, Non-Sterile 4x4 in 1 x Per Week/15 Days Discharge Instructions: Apply over primary dressing as directed. Secondary Dressing: ABD Pad, 8x10 1 x Per Week/15 Days Discharge Instructions: Apply over primary dressing as directed. Compression Wrap: ThreePress (3 layer compression wrap) 1 x Per Week/15 Days Discharge Instructions: Apply three layer compression as directed. Wound #7R - Lower Leg Wound Laterality: Left, Circumferential Cleanser: Soap and Water 1 x Per Week/15 Days Discharge Instructions: May shower and wash wound with dial antibacterial soap and water prior to dressing change. Cleanser: Wound Cleanser 1 x Per Week/15 Days Discharge Instructions: Cleanse the wound with wound cleanser prior to applying a clean dressing using gauze sponges, not tissue or cotton balls. Peri-Wound Care: Triamcinolone 15 (g) 1 x Per Week/15 Days Discharge Instructions: In clinic only. Use triamcinolone 15 (g) in clinic only. Peri-Wound Care: Sween Lotion (Moisturizing lotion) 1 x Per Week/15 Days Discharge Instructions: Apply moisturizing lotion as directed Prim Dressing: KerraCel Ag Gelling Fiber Dressing, 4x5 in (silver alginate) 1 x Per Week/15 Days ary Discharge Instructions:  Apply silver alginate to wound bed as instructed Secondary Dressing: Woven Gauze Sponge, Non-Sterile 4x4 in 1 x Per Week/15 Days Discharge Instructions: Apply over primary dressing as directed. Secondary Dressing: ABD Pad, 8x10 1 x Per Week/15 Days Discharge Instructions: Apply over primary dressing as directed. Compression Wrap: ThreePress (3 layer compression wrap) 1 x Per Week/15 Days Discharge Instructions: Apply three layer compression as directed. [facility] [place]* - Possible recurrence of skin cancer on right anterior lower leg - (ICD10 C44.92 - Squamous cell carcinoma of skin, unspecified) Electronic Signature(s) Signed: 02/07/2021 2:44:28 PM By: Kalman Shan DO Entered By: Kalman Shan on 02/07/2021 14:38:47 Prescription 02/07/2021 [place] Lighter -------------------------------------------------------------------------------- Kalman Shan DO Patient Name: Provider: May 19, 1925 [id number] Date of Birth: NPI#Corliss Marcus Sex: DEA #: [phone] Phone #: License #: [place] Patient Address: [address] [place], [place] [address] [place], [place] [address] [phone] Allergies Zosyn; atorvastatin; atenolol; clarithromycin; codeine; Levaquin; Macrodantin; oxycodone Provider's [place] [facility] [place]: C44.92 - *URGENT* - Possible recurrence of skin cancer on right anterior lower leg Hand Signature: Date(s): Electronic Signature(s) Signed: 02/07/2021 2:44:28 PM By: Kalman Shan DO Entered By: Kalman Shan on 02/07/2021 14:38:49 -------------------------------------------------------------------------------- Problem List Details Patient Name: Date of Service: [name], [name] 02/07/2021 1:00 PM Medical Record Number: [id number] Patient Account Number: [id number] Date of Birth/Sex: Treating  RN: August 28, 1925 (88 y.o. [name] Primary Care Provider: [name] Other Clinician: Referring Provider: Treating Provider/Extender: [name] in Treatment: 35 Active Problems ICD-10 Encounter Code Description Active Date MDM Diagnosis C44.92 Squamous cell carcinoma of skin, unspecified  06/11/2020 No Yes L97.812 Non-pressure chronic ulcer of other part of right lower leg with fat layer 06/11/2020 No Yes exposed L97.822 Non-pressure chronic ulcer of other part of left lower leg with fat layer exposed5/23/2022 No Yes I87.2 Venous insufficiency (chronic) (peripheral) 06/11/2020 No Yes I89.0 Lymphedema, not elsewhere classified 01/31/2021 No Yes Inactive Problems Resolved Problems Electronic Signature(s) Signed: 02/07/2021 2:44:28 PM By: Kalman Shan DO Entered By: Kalman Shan on 02/07/2021 14:35:59 -------------------------------------------------------------------------------- Progress Note Details Patient Name: Date of Service: [name], Jennett A. 02/07/2021 1:00 PM Medical Record Number: [id number] Patient Account Number: [id number] Date of Birth/Sex: Treating RN: 02/01/1925 (88 y.o. [name] Primary Care Provider: [name] Other Clinician: Referring Provider: Treating Provider/Extender: [name] in Treatment: 46 Subjective Chief Complaint Information obtained from Patient Bilateral lower extremity wounds that have been biopsied and positive for squamous cell carcinoma History of Present Illness (HPI) The following HPI elements were documented for the patient's wound: Location: left leg Admission 5/23 Ms. 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 She states that the wound on the right has been present for a year. The 1 on the left has opened up 1 month ago. She is followed with oncology for this issue as she had biopsies that showed squamous cell carcinoma. She is also seeing radiation oncology for treatment options. She presents today because she would like for her wounds to be healed by [place]. She currently denies signs of infection. 6/1; patient presents for 1 week follow-up. She states she has tolerated the leg wraps well. She states these do not bother her and is happy to continue with them. She is scheduled to see her oncologist today to go over treatment options for the bilateral lower extremity squamous cell carcinoma. Radiation is currently not a recommended option. Patient states she overall feels well. 6/22; patient presents for 3-week follow-up. She has tolerated the wraps well until her last wrap where she states they were uncomfortable. She attributes this to the home health nurse. She denies signs of infection. She has started her first treatment of antibody infusions for her Bilateral lower extremity squamous cell carcinoma. She has no complaints today. 7/21; patient presents for 1 month follow-up. Unfortunately she has not had good experience with her wrap changes with home health. She would like to do her own dressing changes. She continues to do her antibody infusions. She denies signs of infection. 7/28; patient presents for 1 week follow-up. At last clinic visit she was switched to daily dressing changes due to issues with the wrap and home health placing them. Unfortunately she has developed weeping to her legs bilaterally. She would like to be placed in wraps today. She would also like to follow with [place] weekly for wrap changes instead of having home health change them. She denies signs of infection. 8/4; patient presents for 1 week follow-up. She has tolerated the Kerlix/Coban wraps well. She no longer has  weeping to her legs. She took the wrap off 1 day before coming in to be able to take a shower. She has no issues or complaints today. She denies signs of infection. 8/18; patient presents for follow-up. Patient has tolerated the wraps well. She brought her Velcro compression wraps today. She has no issues or complaints today. She  had her chemotherapy infusion yesterday without issues. She denies signs of infection. 8/25; patient presents for follow-up. She used her juxta lite compressions for the past week. It is unclear if she is able to put these on correctly since she states she has a hard time getting them to look right. She reports 2 open wounds. She currently denies signs of infection. 9/1; patient presents for 1 week follow-up. She has 1 open wound. She tolerated the compression wraps well. She currently denies signs of infection. 9/8; patient presents for 1 week follow-up. She has 2 open wounds 1 on each leg. She has tolerated the compression wrap well. She currently denies signs of infection. 9/15; patient presents for 1 week follow-up. She now has 3 wounds. 2 on the left and 1 on the right. She continues to tolerate the compression wrap well. She currently denies signs of infection. She has obtained furosemide by her primary care physician and would like to discuss when to take this. 9/22; patient presents for 1 week follow-up. She has scattered wounds on her lower extremities bilaterally. She did take furosemide twice in the past week. She does not recall having to urinate more frequently. She tolerated the 3 layer compression well. She denies signs of infection. 10/13; patient has not been here recently because of [place]. Apparently the facility was only putting gauze on her legs. This is a patient I do not normally see. 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 Ronnald Ramp at [facility] dermatology. She is quite  convincing that she did not have radiation to her lower legs. It is likely she also has significant chronic venous insufficiency stasis dermatitis. We have been using silver alginate under kerlix Coban. She has obvious open areas medially on the right and areas on the left. She also has areas of extensive dry flaking adherent areas on the right and to a lesser extent on the left anterior. Nodular areas on the left lateral lower leg left posterior calf and right mid calf medially. 10/21; patient presents for follow-up. She has no issues or complaints today. She has tolerated the 3 layer compression well bilaterally. She denies signs of infection. 10/27; patient presents for follow-up. She continues to tolerate the 3 layer compression wrap well. She denies signs of infection. 11/30; patient presents for follow-up. She has no issues or complaints today. 11/10; patient arrived in clinic today for nurse visit accompanied by her daughter from [place]. The daughter had multiple questions so we turned this into doctors visit. Apparently after the last time I saw this woman in October she went to see Dr. Ronnald Ramp but he did not take the wraps off. She previously was treated with Cemiplimab for her squamous cell carcinoma. She did not receive radiation to her legs. I had wanted Dr. Ronnald Ramp to look at this because of the exceptionally damaged skin on her lower legs bilaterally. She has odd looking wounds on the left medial lower leg also extending posteriorly which we have not made a lot of progress on. But she also has a raised hyperkeratotic nodules on the right anterior tibial area plaques on the left medial lower thigh. These are not areas that are under compression. We have been using silver alginate on any open areas Liberal TCA under 3 layer compression. 11/17; patient presents for follow-up. She had her wraps taken off yesterday for her dermatology appointment. She reports excessive weeping to her  legs  bilaterally after the wraps were taken off. She currently denies signs of infection. 12/12/2020 upon evaluation today patient appears to be doing about the same in regard to her wounds. She was actually started on Cipro after having biopsies apparently at her dermatology clinic she does not have the results back from the actual biopsy but the culture did return and apparently they placed her on the Cipro she started that just this morning. Other than that her legs appear to be doing okay at this time. 12/1; patient presents for follow-up. She has no issues or complaints today. She has started Lasix 40 mg daily to help with her bilateral leg swelling. 12/8; patient presents for follow-up. She has no issues or complaints today. 12/15; patient presents for 1 week follow-up. She has no issues or complaints today. 12/22; patient presents for follow-up. She has no issues or complaints today. 1/5; patient presents for follow-up. She has no issues or complaints today. She has tolerated the compression wraps well. 1/12; patient presents for follow-up. She is tolerated the compression wrap well and has no issues or complaints today. She denies signs of infection. 1/19; patient presents for follow-up. She took the compression wraps off 1 to 2 days ago to take a shower and did not have compression wraps replaced. She reports increased blistering throughout her legs bilaterally. She currently denies signs of infection. Patient History Information obtained from Patient. Family History Unknown History. Social History Never smoker, Marital Status - Single, Alcohol Use - Never, Drug Use - No History, Caffeine Use - Never. Medical History Eyes Denies history of Cataracts, Optic Neuritis Ear/Nose/Mouth/Throat Denies history of Chronic sinus problems/congestion, Middle ear problems Hematologic/Lymphatic Patient has history of Anemia Denies history of Hemophilia, Human Immunodeficiency Virus, Lymphedema,  Sickle Cell Disease Respiratory Denies history of Aspiration, Asthma, Chronic Obstructive Pulmonary Disease (COPD), Pneumothorax, Sleep Apnea, Tuberculosis Cardiovascular Patient has history of Arrhythmia - Atrial Flutter, A fibb, Congestive Heart Failure, Hypertension Denies history of Angina, Coronary Artery Disease, Deep Vein Thrombosis, Hypotension, Myocardial Infarction, Peripheral Arterial Disease, Peripheral Venous Disease, Phlebitis, Vasculitis Gastrointestinal Patient has history of Colitis Denies history of Cirrhosis , Crohnoos, Hepatitis A, Hepatitis B, Hepatitis C Endocrine Denies history of Type I Diabetes, Type II Diabetes Genitourinary Denies history of End Stage Renal Disease Immunological Denies history of Lupus Erythematosus, Raynaudoos, Scleroderma Integumentary (Skin) Denies history of History of Burn Musculoskeletal Patient has history of Osteoarthritis Denies history of Gout, Rheumatoid Arthritis, Osteomyelitis Neurologic Denies history of Dementia, Neuropathy, Quadriplegia, Paraplegia, Seizure Disorder Oncologic Denies history of Received Chemotherapy, Received Radiation Hospitalization/Surgery History - removal of rod left leg. 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 Vitals Time Taken: 1:13 PM, Height: 65 in, Weight: 163 lbs, BMI: 27.1, Temperature: 97.8 F, Pulse: 82 bpm, Respiratory Rate: 16 breaths/min, Blood Pressure: 113/71 mmHg. Cardiovascular 2+ dorsalis pedis/posterior tibialis pulses. Psychiatric pleasant and cooperative. General Notes: Scattered wounds to her lower extremities bilaterally limited to skin breakdown. Several blisters to her legs bilaterally with serous fluid that easily opened. A suspicious lesion to the  right anterior leg where She previously had squamous cell carcinoma. Integumentary (Hair, Skin) Wound #15 status is Converted. Original cause of wound was Gradually Appeared. The date acquired was: 11/08/2020. The wound has been in treatment 13 weeks. The wound is located on the Right,Posterior Lower Leg. There is a medium amount of serosanguineous drainage noted. Wound #18  status is Converted. Original cause of wound was Blister. The date acquired was: 01/31/2021. The wound has been in treatment 1 weeks. The wound is located on the Right,Proximal,Medial Lower Leg. There is a medium amount of serosanguineous drainage noted. Wound #19 status is Open. Original cause of wound was Blister. The date acquired was: 02/06/2021. The wound is located on the Right,Circumferential Lower Leg. The wound measures 27.5cm length x 23.5cm width x 0.5cm depth; 507.564cm^2 area and 253.782cm^3 volume. There is Fat Layer (Subcutaneous Tissue) exposed. There is no tunneling or undermining noted. There is a medium amount of serosanguineous drainage noted. The wound margin is distinct with the outline attached to the wound base. There is large (67-100%) red granulation within the wound bed. There is a small (1-33%) amount of necrotic tissue within the wound bed including Adherent Slough. Wound #7R status is Open. Original cause of wound was Blister. The date acquired was: 04/20/2020. The wound has been in treatment 34 weeks. The wound is located on the Left,Circumferential Lower Leg. The wound measures 16.5cm length x 35.2cm width x 0.1cm depth; 456.159cm^2 area and 45.616cm^3 volume. There is Fat Layer (Subcutaneous Tissue) exposed. There is no tunneling or undermining noted. There is a medium amount of serosanguineous drainage noted. The wound margin is distinct with the outline attached to the wound base. There is large (67-100%) red, pink granulation within the wound bed. There is no necrotic tissue within the wound  bed. Assessment Active Problems ICD-10 Squamous cell carcinoma of skin, unspecified Non-pressure chronic ulcer of other part of right lower leg with fat layer exposed Non-pressure chronic ulcer of other part of left lower leg with fat layer exposed Venous insufficiency (chronic) (peripheral) Lymphedema, not elsewhere classified Patient has scattered bilateral wounds limited to skin breakdown along with blistering. She did not have compression therapy on for the past several days. I recommended continuing with silver alginate and 3 layer compression. Unfortunately there is a suspicious lesion to the anterior right leg concerning for reoccuring SCC. We will reach out to her oncologist office to schedule an appointment for follow-up. Procedures Wound #19 Pre-procedure diagnosis of Wound #19 is a Venous Leg Ulcer located on the Right,Circumferential Lower Leg . There was a Three Layer Compression Therapy Procedure by Levan Hurst, RN. Post procedure Diagnosis Wound #19: Same as Pre-Procedure Wound #7R Pre-procedure diagnosis of Wound #7R is a Malignant Wound located on the Left,Circumferential Lower Leg . There was a Three Layer Compression Therapy Procedure by Levan Hurst, RN. Post procedure Diagnosis Wound #7R: Same as Pre-Procedure Plan Follow-up Appointments: Return Appointment in 1 week. - Dr. Heber [place] Bathing/ Shower/ Hygiene: May shower with protection but do not get wound dressing(s) wet. - Use cast protector Edema Control - Lymphedema / SCD / Other: Elevate legs to the level of the heart or above for 30 minutes daily and/or when sitting, a frequency of: - elevate the legs throughout the day heart level if possible. Avoid standing for long periods of time. Exercise regularly Additional Orders / Instructions: Follow Nutritious Diet ordered were: [place] [facility]* - Possible recurrence of skin cancer on right anterior lower leg WOUND #19: - Lower  Leg Wound Laterality: Right, Circumferential Cleanser: Soap and Water 1 x Per Week/15 Days Discharge Instructions: May shower and wash wound with dial antibacterial soap and water prior to dressing change. Cleanser: Wound Cleanser 1 x Per Week/15 Days Discharge Instructions: Cleanse the wound with wound cleanser prior to applying a clean dressing using  gauze sponges, not tissue or cotton balls. Peri-Wound Care: Triamcinolone 15 (g) 1 x Per Week/15 Days Discharge Instructions: In clinic only. Use triamcinolone 15 (g) in clinic only. Peri-Wound Care: Sween Lotion (Moisturizing lotion) 1 x Per Week/15 Days Discharge Instructions: Apply moisturizing lotion as directed Prim Dressing: KerraCel Ag Gelling Fiber Dressing, 4x5 in (silver alginate) 1 x Per Week/15 Days ary Discharge Instructions: Apply silver alginate to wound bed as instructed Secondary Dressing: Woven Gauze Sponge, Non-Sterile 4x4 in 1 x Per Week/15 Days Discharge Instructions: Apply over primary dressing as directed. Secondary Dressing: ABD Pad, 8x10 1 x Per Week/15 Days Discharge Instructions: Apply over primary dressing as directed. Com pression Wrap: ThreePress (3 layer compression wrap) 1 x Per Week/15 Days Discharge Instructions: Apply three layer compression as directed. WOUND #7R: - Lower Leg Wound Laterality: Left, Circumferential Cleanser: Soap and Water 1 x Per Week/15 Days Discharge Instructions: May shower and wash wound with dial antibacterial soap and water prior to dressing change. Cleanser: Wound Cleanser 1 x Per Week/15 Days Discharge Instructions: Cleanse the wound with wound cleanser prior to applying a clean dressing using gauze sponges, not tissue or cotton balls. Peri-Wound Care: Triamcinolone 15 (g) 1 x Per Week/15 Days Discharge Instructions: In clinic only. Use triamcinolone 15 (g) in clinic only. Peri-Wound Care: Sween Lotion (Moisturizing lotion) 1 x Per Week/15 Days Discharge Instructions: Apply  moisturizing lotion as directed Prim Dressing: KerraCel Ag Gelling Fiber Dressing, 4x5 in (silver alginate) 1 x Per Week/15 Days ary Discharge Instructions: Apply silver alginate to wound bed as instructed Secondary Dressing: Woven Gauze Sponge, Non-Sterile 4x4 in 1 x Per Week/15 Days Discharge Instructions: Apply over primary dressing as directed. Secondary Dressing: ABD Pad, 8x10 1 x Per Week/15 Days Discharge Instructions: Apply over primary dressing as directed. Com pression Wrap: ThreePress (3 layer compression wrap) 1 x Per Week/15 Days Discharge Instructions: Apply three layer compression as directed. 1. Silver alginate under 3 layer Coflex 2. Follow-up in 1 week Electronic Signature(s) Signed: 02/07/2021 2:44:28 PM By: Kalman Shan DO Entered By: Kalman Shan on 02/07/2021 14:40:55 -------------------------------------------------------------------------------- HxROS Details Patient Name: Date of Service: [name], [name] 02/07/2021 1:00 PM Medical Record Number: [id number] Patient Account Number: [id number] Date of Birth/Sex: Treating RN: 10/18/1925 (88 y.o. [name] Primary Care Provider: [name] Other Clinician: Referring Provider: Treating Provider/Extender: [name] in Treatment: 108 Information Obtained From Patient Eyes Medical History: Negative for: Cataracts; Optic Neuritis Ear/Nose/Mouth/Throat Medical History: Negative for: Chronic sinus problems/congestion; Middle ear problems Hematologic/Lymphatic Medical History: Positive for: Anemia Negative for: Hemophilia; Human Immunodeficiency Virus; Lymphedema; Sickle Cell Disease Respiratory Medical History: Negative for: Aspiration; Asthma; Chronic Obstructive Pulmonary Disease (COPD); Pneumothorax; Sleep Apnea; Tuberculosis Cardiovascular Medical History: Positive for: Arrhythmia - Atrial Flutter, A fibb; Congestive Heart Failure;  Hypertension Negative for: Angina; Coronary Artery Disease; Deep Vein Thrombosis; Hypotension; Myocardial Infarction; Peripheral Arterial Disease; Peripheral Venous Disease; Phlebitis; Vasculitis Past Medical History Notes: hyperlipidemia Gastrointestinal Medical History: Positive for: Colitis Negative for: Cirrhosis ; Crohns; Hepatitis A; Hepatitis B; Hepatitis C Endocrine Medical History: Negative for: Type I Diabetes; Type II Diabetes Past Medical History Notes: hypothyroidism Genitourinary Medical History: Negative for: End Stage Renal Disease Immunological Medical History: Negative for: Lupus Erythematosus; Raynauds; Scleroderma Integumentary (Skin) Medical History: Negative for: History of Burn Musculoskeletal Medical History: Positive for: Osteoarthritis Negative for: Gout; Rheumatoid Arthritis; Osteomyelitis Neurologic Medical History: Negative for: Dementia; Neuropathy; Quadriplegia; Paraplegia; Seizure Disorder Past Medical History Notes: lumbar spindylolysis Oncologic Medical History: Negative  for: Received Chemotherapy; Received Radiation Past Medical History Notes: BLE squamous ceel carcionoma Immunizations Pneumococcal Vaccine: Received Pneumococcal Vaccination: Yes Received Pneumococcal Vaccination On or After 60th Birthday: No Implantable Devices None Hospitalization / Surgery History Type of Hospitalization/Surgery removal of rod left leg Family and Social History Unknown History: Yes; Never smoker; Marital Status - Single; Alcohol Use: Never; Drug Use: No History; Caffeine Use: Never; Financial Concerns: No; Food, Clothing or Shelter Needs: No; Support System Lacking: No; Transportation Concerns: No Electronic Signature(s) Signed: 02/07/2021 2:44:28 PM By: Kalman Shan DO Signed: 02/11/2021 5:11:23 PM By: Levan Hurst RN, BSN Entered By: Kalman Shan on 02/07/2021  14:37:04 -------------------------------------------------------------------------------- [facility] Details Patient Name: Date of Service: [name], [facility] [place]. 02/07/2021 Medical Record Number: [id number] Patient Account Number: [id number] Date of Birth/Sex: Treating RN: 1925/06/15 (88 y.o. [name] Primary Care Provider: [name] Other Clinician: Referring Provider: Treating Provider/Extender: [name] in Treatment: 34 Diagnosis Coding ICD-10 Codes Code Description C44.92 Squamous cell carcinoma of skin, unspecified L97.812 Non-pressure chronic ulcer of other part of right lower leg with fat layer exposed L97.822 Non-pressure chronic ulcer of other part of left lower leg with fat layer exposed I87.2 Venous insufficiency (chronic) (peripheral) I89.0 Lymphedema, not elsewhere classified Facility Procedures CPT4: Code [id number] Description: 81 BILATERAL: Application of multi-layer venous compression system; leg (below knee), including ankle and t. Modifier: Quantity: 1 Physician Procedures : CPT4 Code Description Modifier [id number] - WC PHYS LEVEL 3 - EST PT ICD-10 Diagnosis Description J67.341 Non-pressure chronic ulcer of other part of right lower leg with fat layer exposed L97.822 Non-pressure chronic ulcer of other part of left  lower leg with fat layer exposed C44.92 Squamous cell carcinoma of skin, unspecified I89.0 Lymphedema, not elsewhere classified Quantity: 1 Electronic Signature(s) Signed: 02/08/2021 9:10:14 AM By: Kalman Shan DO Signed: 02/11/2021 5:11:23 PM By: Levan Hurst RN, BSN Previous Signature: 02/07/2021 2:44:28 PM Version By: Kalman Shan DO Entered By: Levan Hurst on 02/07/2021 17:09:36

## 2021-02-14 ENCOUNTER — Encounter (HOSPITAL_BASED_OUTPATIENT_CLINIC_OR_DEPARTMENT_OTHER): Payer: Medicare Other | Admitting: Internal Medicine

## 2021-02-14 ENCOUNTER — Other Ambulatory Visit: Payer: Self-pay

## 2021-02-14 DIAGNOSIS — L97812 Non-pressure chronic ulcer of other part of right lower leg with fat layer exposed: Secondary | ICD-10-CM

## 2021-02-14 DIAGNOSIS — C4492 Squamous cell carcinoma of skin, unspecified: Secondary | ICD-10-CM | POA: Diagnosis not present

## 2021-02-14 DIAGNOSIS — L97822 Non-pressure chronic ulcer of other part of left lower leg with fat layer exposed: Secondary | ICD-10-CM | POA: Diagnosis not present

## 2021-02-14 DIAGNOSIS — I872 Venous insufficiency (chronic) (peripheral): Secondary | ICD-10-CM | POA: Diagnosis not present

## 2021-02-14 DIAGNOSIS — Z85828 Personal history of other malignant neoplasm of skin: Secondary | ICD-10-CM | POA: Diagnosis not present

## 2021-02-14 DIAGNOSIS — I890 Lymphedema, not elsewhere classified: Secondary | ICD-10-CM | POA: Diagnosis not present

## 2021-02-14 NOTE — Progress Notes (Signed)
[name] ([id number]) , Visit Report for 02/14/2021 Arrival Information Details Patient Name: Date of Service: [name], [place]. 02/14/2021 1:30 PM Medical Record Number: [id number] Patient Account Number: [id number] Date of Birth/Sex: Treating RN: Jun 14, 1925 (88 y.o. [name] Primary Care [name]: [name] Other Clinician: Referring [name]: Treating [name]/Extender: [name] in Treatment: 61 Visit Information History Since Last Visit Added or deleted any medications: No Patient Arrived: Walker Any new allergies or adverse reactions: No Arrival Time: 13:43 Had a fall or experienced change in No Transfer Assistance: None activities of daily living that may affect Patient Identification Verified: Yes risk of falls: Secondary Verification Process Completed: Yes Signs or symptoms of abuse/neglect since last visito No Patient Requires Transmission-Based No Hospitalized since last visit: No Precautions: Implantable device outside of the clinic excluding No Patient Has Alerts: Yes cellular tissue based products placed in the center Patient Alerts: R ABI = Non compressible since last visit: L ABI = Non compressible Has Dressing in Place as Prescribed: Yes Has Compression in Place as Prescribed: Yes Pain Present Now: No Electronic Signature(s) Signed: 02/14/2021 5:00:57 PM By: [name] Entered By: [name] on 02/14/2021 13:43:49 -------------------------------------------------------------------------------- Compression Therapy Details Patient Name: Date of Service: [name], [name] 02/14/2021 1:30 PM Medical Record Number: [id number] Patient Account Number: [id number] Date of Birth/Sex: Treating RN: Mar 01, 1925 (88 y.o. [name] Primary Care [name]: [name] Other Clinician: Referring [name]: Treating [name]/Extender: [name] in Treatment:  35 Compression Therapy Performed for Wound Assessment: Wound #19 Bell,Circumferential Lower Leg Performed By: Clinician [name] Jackson, RN Compression Type: Three Layer Post Procedure Diagnosis Same as Pre-procedure Electronic Signature(s) Signed: 02/14/2021 5:00:57 PM By: [name] Entered By: [name] on 02/14/2021 14:24:50 -------------------------------------------------------------------------------- Compression Therapy Details Patient Name: Date of Service: [name], [name] 02/14/2021 1:30 PM Medical Record Number: [id number] Patient Account Number: [id number] Date of Birth/Sex: Treating RN: 01/12/1926 (88 y.o. [name] Primary Care [name]: [name] Other Clinician: Referring Jahfari Ambers: Treating [name]/Extender: [name] in Treatment: 35 Compression Therapy Performed for Wound Assessment: Wound #7R Left,Circumferential Lower Leg Performed By: Clinician [name] Jackson, RN Compression Type: Three Layer Post Procedure Diagnosis Same as Pre-procedure Electronic Signature(s) Signed: 02/14/2021 5:00:57 PM By: [name] Entered By: [name] on 02/14/2021 14:24:50 -------------------------------------------------------------------------------- Encounter Discharge Information Details Patient Name: Date of Service: [name], [name] 02/14/2021 1:30 PM Medical Record Number: [id number] Patient Account Number: [id number] Date of Birth/Sex: Treating RN: 06-Nov-1925 (88 y.o. [name] Primary Care [name]: [name] Other Clinician: Referring [name]: Treating [name]/Extender: [name] in Treatment: 56 Encounter Discharge Information Items Discharge Condition: Stable Ambulatory Status: Walker Discharge Destination: Home Transportation: Other Schedule Follow-up Appointment: Yes Clinical Summary of Care: Provided on 02/14/2021 Form Type  Recipient Paper Patient Patient Electronic Signature(s) Signed: 02/14/2021 5:00:57 PM By: [name] Entered By: [name] on 02/14/2021 14:27:37 -------------------------------------------------------------------------------- Lower Extremity Assessment Details Patient Name: Date of Service: [name], [name] 02/14/2021 1:30 PM Medical Record Number: [id number] Patient Account Number: [id number] Date of Birth/Sex: Treating RN: 04/21/1925 (89 y.o. [name] Primary Care Emalina Dubreuil: [name] Other Clinician: Referring Kamaron Deskins: Treating [name]/Extender: [name] in Treatment: 35 Edema Assessment Assessed: [Left: Yes] [Bell: Yes] Edema: [Left: Yes] [Bell: Yes] Calf Left: Bell: Point of Measurement: 30 cm From Medial Instep 33.6 cm 32.5 cm Ankle Left: Bell: Point  of Measurement: 9 cm From Medial Instep 23.5 cm 20.8 cm Vascular Assessment Pulses: Dorsalis Pedis Palpable: [Left:Yes] [Bell:Yes] Electronic Signature(s) Signed: 02/14/2021 5:00:57 PM By: [name] Entered By: [name] on 02/14/2021 14:04:57 -------------------------------------------------------------------------------- Multi Wound Chart Details Patient Name: Date of Service: [name], [name] 02/14/2021 1:30 PM Medical Record Number: [id number] Patient Account Number: [id number] Date of Birth/Sex: Treating RN: 1925/12/14 (88 y.o. [name] Primary Care [name]: [name] Other Clinician: Referring [name]: Treating [name]/Extender: [name] in Treatment: 35 Vital Signs Height(in): 65 Pulse(bpm): 84 Weight(lbs): 163 Blood Pressure(mmHg): 120/71 Body Mass Index(BMI): 27.1 Temperature(F): 97.7 Respiratory Rate(breaths/min): 18 Photos: [N/A:N/A] Bell, Circumferential Lower Leg Left, Circumferential Lower Leg N/A Wound Location: Blister Blister N/A Wounding  Event: Venous Leg Ulcer Malignant Wound N/A Primary Etiology: Anemia, Arrhythmia, Congestive Heart Anemia, Arrhythmia, Congestive Heart N/A Comorbid History: Failure, Hypertension, Colitis, Failure, Hypertension, Colitis, Osteoarthritis Osteoarthritis 02/06/2021 04/20/2020 N/A Date Acquired: 1 35 N/A Weeks of Treatment: Open Open N/A Wound Status: No Yes N/A Wound Recurrence: Yes No N/A Clustered Wound: 20x15x0.4 16x28x0.1 N/A Measurements L x W x D (cm) 235.619 351.858 N/A A (cm) : rea 94.248 35.186 N/A Volume (cm) : 53.60% -11357.40% N/A % Reduction in Area: 62.90% -11361.20% N/A % Reduction in Volume: Full Thickness Without Exposed Full Thickness Without Exposed N/A Classification: Support Structures Support Structures Medium Medium N/A Exudate Amount: Serosanguineous Serosanguineous N/A Exudate Type: red, brown red, brown N/A Exudate Color: Distinct, outline attached Distinct, outline attached N/A Wound Margin: Large (67-100%) Medium (34-66%) N/A Granulation Amount: Red Red, Pink N/A Granulation Quality: Small (1-33%) Small (1-33%) N/A Necrotic Amount: Fat Layer (Subcutaneous Tissue): Yes Fat Layer (Subcutaneous Tissue): Yes N/A Exposed Structures: Fascia: No Fascia: No Tendon: No Tendon: No Muscle: No Muscle: No Joint: No Joint: No Bone: No Bone: No Medium (34-66%) None N/A Epithelialization: Compression Therapy Compression Therapy N/A Procedures Performed: Treatment Notes Wound #19 (Lower Leg) Wound Laterality: Bell, Circumferential Cleanser Soap and Water Discharge Instruction: May shower and wash wound with dial antibacterial soap and water prior to dressing change. Wound Cleanser Discharge Instruction: Cleanse the wound with wound cleanser prior to applying a clean dressing using gauze sponges, not tissue or cotton balls. Peri-Wound Care Triamcinolone 15 (g) Discharge Instruction: In clinic only. Use triamcinolone 15 (g) in clinic  only. Sween Lotion (Moisturizing lotion) Discharge Instruction: Apply moisturizing lotion as directed Topical Primary Dressing KerraCel Ag Gelling Fiber Dressing, 4x5 in (silver alginate) Discharge Instruction: Apply silver alginate to wound bed as instructed Secondary Dressing Woven Gauze Sponge, Non-Sterile 4x4 in Discharge Instruction: Apply over primary dressing as directed. ABD Pad, 8x10 Discharge Instruction: Apply over primary dressing as directed. Secured With Compression Wrap ThreePress (3 layer compression wrap) Discharge Instruction: Apply three layer compression as directed. Compression Stockings Add-Ons Wound #7R (Lower Leg) Wound Laterality: Left, Circumferential Cleanser Soap and Water Discharge Instruction: May shower and wash wound with dial antibacterial soap and water prior to dressing change. Wound Cleanser Discharge Instruction: Cleanse the wound with wound cleanser prior to applying a clean dressing using gauze sponges, not tissue or cotton balls. Peri-Wound Care Triamcinolone 15 (g) Discharge Instruction: In clinic only. Use triamcinolone 15 (g) in clinic only. Sween Lotion (Moisturizing lotion) Discharge Instruction: Apply moisturizing lotion as directed Topical Primary Dressing KerraCel Ag Gelling Fiber Dressing, 4x5 in (silver alginate) Discharge Instruction: Apply silver alginate to wound bed as instructed Secondary Dressing Woven Gauze Sponge, Non-Sterile 4x4 in Discharge Instruction: Apply over primary dressing as directed. ABD Pad,  8x10 Discharge Instruction: Apply over primary dressing as directed. Secured With Compression Wrap ThreePress (3 layer compression wrap) Discharge Instruction: Apply three layer compression as directed. Compression Stockings Add-Ons Electronic Signature(s) Signed: 02/14/2021 2:40:32 PM By: Kalman Shan DO Signed: 02/14/2021 6:13:38 PM By: Levan Hurst RN, BSN Entered By: Kalman Shan on 02/14/2021  14:30:48 -------------------------------------------------------------------------------- [place] Details Patient Name: Date of Service: [name], [place]. 02/14/2021 1:30 PM Medical Record Number: [id number] Patient Account Number: [id number] Date of Birth/Sex: Treating RN: [birth year] (88 y.o. [name] Primary Care Dawsen Krieger: [name] Other Clinician: Referring Alvey Brockel: Treating Walida Cajas/Extender: [name] in Treatment: 71 Multidisciplinary Care Plan reviewed with physician Active Inactive Venous Leg Ulcer Nursing Diagnoses: Knowledge deficit related to disease process and management Potential for venous Insuffiency (use before diagnosis confirmed) Goals: Patient will maintain optimal edema control Date Initiated: 11/01/2020 Target Resolution Date: 02/28/2021 Goal Status: Active Interventions: Assess peripheral edema status every visit. Compression as ordered Provide education on venous insufficiency Treatment Activities: Therapeutic compression applied : 11/01/2020 Notes: 12/27/20: Edema control ongoing 01/24/21: Edema control continues, using 3 layer compression Wound/Skin Impairment Nursing Diagnoses: Impaired tissue integrity Knowledge deficit related to ulceration/compromised skin integrity Goals: Patient/caregiver will verbalize understanding of skin care regimen Date Initiated: 06/11/2020 Target Resolution Date: 02/28/2021 Goal Status: Active Interventions: Assess patient/caregiver ability to obtain necessary supplies Assess patient/caregiver ability to perform ulcer/skin care regimen upon admission and as needed Assess ulceration(s) every visit Provide education on ulcer and skin care Notes: 10/11/20: Wound care regimen ongoing. 12/27/20: Wound care continues Electronic Signature(s) Signed: 02/14/2021 5:00:57 PM By: [name] Entered By: [name] on 02/14/2021  13:41:17 -------------------------------------------------------------------------------- Pain Assessment Details Patient Name: Date of Service: [name], [place]. 02/14/2021 1:30 PM Medical Record Number: [id number] Patient Account Number: [id number] Date of Birth/Sex: Treating RN: Jun 05, 1925 (88 y.o. [name] Primary Care Stephaney Steven: [name] Other Clinician: Referring Jaliya Siegmann: Treating Lisandra Mathisen/Extender: [name] in Treatment: 15 Active Problems Location of Pain Severity and Description of Pain Patient Has Paino No Site Locations Pain Management and Medication Current Pain Management: Electronic Signature(s) Signed: 02/14/2021 5:00:57 PM By: [name] Entered By: [name] on 02/14/2021 13:47:38 -------------------------------------------------------------------------------- Patient/Caregiver Education Details Patient Name: Date of Service: [name], [place] A. 1/26/2023andnbsp1:30 PM Medical Record Number: [id number] Patient Account Number: [id number] Date of Birth/Gender: Treating RN: 1925/04/29 (88 y.o. [name] Primary Care Physician: [name] Other Clinician: Referring Physician: Treating Physician/Extender: [name] in Treatment: 47 Education Assessment Education Provided To: Patient Education Topics Provided Malignant/Atypical Wounds: Methods: Explain/Verbal Responses: State content correctly Venous: Methods: Explain/Verbal, Printed Responses: State content correctly Wound/Skin Impairment: Methods: Explain/Verbal, Printed Responses: State content correctly Electronic Signature(s) Signed: 02/14/2021 5:00:57 PM By: [name] Entered By: [name] on 02/14/2021 13:41:52 -------------------------------------------------------------------------------- Wound Assessment Details Patient Name: Date of Service: [name], [place]. 02/14/2021  1:30 PM Medical Record Number: [id number] Patient Account Number: [id number] Date of Birth/Sex: Treating RN: 05-01-1925 (88 y.o. [name] Primary Care Wise Fees: [name] Other Clinician: Referring Maritza Hosterman: Treating Cadan Maggart/Extender: [name] in Treatment: 35 Wound Status Wound Number: 19 Primary Venous Leg Ulcer Etiology: Wound Location: Bell, Circumferential Lower Leg Wound Open Wounding Event: Blister Status: Date Acquired: 02/06/2021 Comorbid Anemia, Arrhythmia, Congestive Heart Failure, Hypertension, Weeks Of Treatment: 1 History: Colitis, Osteoarthritis Clustered Wound: Yes Photos Wound Measurements Length: (cm) 20 Width: (cm) 15 Depth: (cm) 0.4 Area: (cm) 235.619  Volume: (cm) 94.248 % Reduction in Area: 53.6% % Reduction in Volume: 62.9% Epithelialization: Medium (34-66%) Tunneling: No Undermining: No Wound Description Classification: Full Thickness Without Exposed Support Structures Wound Margin: Distinct, outline attached Exudate Amount: Medium Exudate Type: Serosanguineous Exudate Color: red, brown Foul Odor After Cleansing: No Slough/Fibrino Yes Wound Bed Granulation Amount: Large (67-100%) Exposed Structure Granulation Quality: Red Fascia Exposed: No Necrotic Amount: Small (1-33%) Fat Layer (Subcutaneous Tissue) Exposed: Yes Necrotic Quality: Adherent Slough Tendon Exposed: No Muscle Exposed: No Joint Exposed: No Bone Exposed: No Treatment Notes Wound #19 (Lower Leg) Wound Laterality: Bell, Circumferential Cleanser Soap and Water Discharge Instruction: May shower and wash wound with dial antibacterial soap and water prior to dressing change. Wound Cleanser Discharge Instruction: Cleanse the wound with wound cleanser prior to applying a clean dressing using gauze sponges, not tissue or cotton balls. Peri-Wound Care Triamcinolone 15 (g) Discharge Instruction: In clinic only. Use  triamcinolone 15 (g) in clinic only. Sween Lotion (Moisturizing lotion) Discharge Instruction: Apply moisturizing lotion as directed Topical Primary Dressing KerraCel Ag Gelling Fiber Dressing, 4x5 in (silver alginate) Discharge Instruction: Apply silver alginate to wound bed as instructed Secondary Dressing Woven Gauze Sponge, Non-Sterile 4x4 in Discharge Instruction: Apply over primary dressing as directed. ABD Pad, 8x10 Discharge Instruction: Apply over primary dressing as directed. Secured With Compression Wrap ThreePress (3 layer compression wrap) Discharge Instruction: Apply three layer compression as directed. Compression Stockings Add-Ons Electronic Signature(s) Signed: 02/14/2021 5:00:57 PM By: [name] Entered By: [name] on 02/14/2021 14:10:51 -------------------------------------------------------------------------------- Wound Assessment Details Patient Name: Date of Service: [name], [place]. 02/14/2021 1:30 PM Medical Record Number: [id number] Patient Account Number: [id number] Date of Birth/Sex: Treating RN: [birth year] (88 y.o. [name] Primary Care Starlee Corralejo: [name] Other Clinician: Referring Jaymon Dudek: Treating Mileigh Tilley/Extender: [name] in Treatment: 35 Wound Status Wound Number: 7R Primary Malignant Wound Etiology: Wound Location: Left, Circumferential Lower Leg Wound Open Wounding Event: Blister Status: Date Acquired: 04/20/2020 Comorbid Anemia, Arrhythmia, Congestive Heart Failure, Hypertension, Weeks Of Treatment: 35 History: Colitis, Osteoarthritis Clustered Wound: No Photos Wound Measurements Length: (cm) 16 Width: (cm) 28 Depth: (cm) 0.1 Area: (cm) 351.858 Volume: (cm) 35.186 % Reduction in Area: -11357.4% % Reduction in Volume: -11361.2% Epithelialization: None Tunneling: No Undermining: No Wound Description Classification: Full Thickness Without Exposed Support  Structures Wound Margin: Distinct, outline attached Exudate Amount: Medium Exudate Type: Serosanguineous Exudate Color: red, brown Foul Odor After Cleansing: No Slough/Fibrino Yes Wound Bed Granulation Amount: Medium (34-66%) Exposed Structure Granulation Quality: Red, Pink Fascia Exposed: No Necrotic Amount: Small (1-33%) Fat Layer (Subcutaneous Tissue) Exposed: Yes Necrotic Quality: Adherent Slough Tendon Exposed: No Muscle Exposed: No Joint Exposed: No Bone Exposed: No Treatment Notes Wound #7R (Lower Leg) Wound Laterality: Left, Circumferential Cleanser Soap and Water Discharge Instruction: May shower and wash wound with dial antibacterial soap and water prior to dressing change. Wound Cleanser Discharge Instruction: Cleanse the wound with wound cleanser prior to applying a clean dressing using gauze sponges, not tissue or cotton balls. Peri-Wound Care Triamcinolone 15 (g) Discharge Instruction: In clinic only. Use triamcinolone 15 (g) in clinic only. Sween Lotion (Moisturizing lotion) Discharge Instruction: Apply moisturizing lotion as directed Topical Primary Dressing KerraCel Ag Gelling Fiber Dressing, 4x5 in (silver alginate) Discharge Instruction: Apply silver alginate to wound bed as instructed Secondary Dressing Woven Gauze Sponge, Non-Sterile 4x4 in Discharge Instruction: Apply over primary dressing as directed. ABD Pad, 8x10 Discharge Instruction: Apply over primary dressing as directed. Secured With Compression Wrap  ThreePress (3 layer compression wrap) Discharge Instruction: Apply three layer compression as directed. Compression Stockings Add-Ons Electronic Signature(s) Signed: 02/14/2021 5:00:57 PM By: [name] Entered By: [name] on 02/14/2021 14:11:33 -------------------------------------------------------------------------------- Vitals Details Patient Name: Date of Service: [name], [place]. 02/14/2021 1:30 PM Medical Record Number:  [id number] Patient Account Number: [id number] Date of Birth/Sex: Treating RN: 02/28/1925 (88 y.o. [name] Primary Care Hendrix Console: [name] Other Clinician: Referring Jennah Satchell: Treating Aerie Donica/Extender: [name] in Treatment: 35 Vital Signs Time Taken: 13:45 Temperature (F): 97.7 Height (in): 65 Pulse (bpm): 65 Weight (lbs): 163 Respiratory Rate (breaths/min): 18 Body Mass Index (BMI): 27.1 Blood Pressure (mmHg): 120/71 Reference Range: 80 - 120 mg / dl Electronic Signature(s) Signed: 02/14/2021 5:00:57 PM By: [name] Entered By: [name] on 02/14/2021 13:45:52

## 2021-02-14 NOTE — Progress Notes (Signed)
[name] ([id number]) , Visit Report for 02/14/2021 Chief Complaint Document Details Patient Name: Date of Service: [name], [place]. 02/14/2021 1:30 PM Medical Record Number: [id number] Patient Account Number: [id number] Date of Birth/Sex: Treating RN: Jul 11, 1925 (88 y.o. [name] Primary Care Provider: [name] Other Clinician: Referring Provider: Treating Provider/Extender: [name] in Treatment: 26 Information Obtained from: Patient Chief Complaint Bilateral lower extremity wounds that have been biopsied and positive for squamous cell carcinoma Electronic Signature(s) Signed: 02/14/2021 2:40:32 PM By: Kalman Shan DO Entered By: Kalman Shan on 02/14/2021 14:30:59 -------------------------------------------------------------------------------- HPI Details Patient Name: Date of Service: [name], [place]. 02/14/2021 1:30 PM Medical Record Number: [id number] Patient Account Number: [id number] Date of Birth/Sex: Treating RN: [birth year] (88 y.o. [name] Primary Care Provider: [name] Other Clinician: Referring Provider: Treating Provider/Extender: [name] in Treatment: 35 History of Present Illness Location: left leg HPI Description: Admission 5/23 Ms. 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 She states that the wound on the right has been present for a year. The 1 on the left has opened up 1 month ago. She is followed with oncology for this issue as she had biopsies that showed squamous cell carcinoma. She is also seeing radiation oncology for treatment options. She presents today because she would like for her wounds to be healed  by [place]. She currently denies signs of infection. 6/1; patient presents for 1 week follow-up. She states she has tolerated the leg wraps well. She states these do not bother her and is happy to continue with them. She is scheduled to see her oncologist today to go over treatment options for the bilateral lower extremity squamous cell carcinoma. Radiation is currently not a recommended option. Patient states she overall feels well. 6/22; patient presents for 3-week follow-up. She has tolerated the wraps well until her last wrap where she states they were uncomfortable. She attributes this to the home health nurse. She denies signs of infection. She has started her first treatment of antibody infusions for her Bilateral lower extremity squamous cell carcinoma. She has no complaints today. 7/21; patient presents for 1 month follow-up. Unfortunately she has not had good experience with her wrap changes with home health. She would like to do her own dressing changes. She continues to do her antibody infusions. She denies signs of infection. 7/28; patient presents for 1 week follow-up. At last clinic visit she was switched to daily dressing changes due to issues with the wrap and home health placing them. Unfortunately she has developed weeping to her legs bilaterally. She would like to be placed in wraps today. She would also like to follow with [place] weekly for wrap changes instead of having home health change them. She denies signs of infection. 8/4; patient presents for 1 week follow-up. She has tolerated the Kerlix/Coban wraps well. She no longer has weeping to her legs. She took the wrap off 1 day before coming in to be able to take a shower. She has no issues or complaints today. She denies signs of infection. 8/18; patient presents for follow-up. Patient has tolerated the wraps well. She brought her Velcro compression wraps today. She has no issues or complaints today. She had her chemotherapy infusion  yesterday without issues. She denies signs of infection. 8/25; patient presents for follow-up. She used her juxta lite compressions for the past week. It is unclear if she is able to put these on correctly since she states she has a hard time getting them to look right. She reports 2 open wounds. She currently denies signs of infection. 9/1; patient presents for 1 week follow-up. She has 1 open wound. She tolerated the compression wraps well. She currently denies signs of infection. 9/8; patient presents for 1 week follow-up. She has 2 open wounds 1 on each leg. She has tolerated the compression wrap well. She currently denies signs of infection. 9/15; patient presents for 1 week follow-up. She now has 3 wounds. 2 on the left and 1 on the right. She continues to tolerate the compression wrap well. She currently denies signs of infection. She has obtained furosemide by her primary care physician and would like to discuss when to take this. 9/22; patient presents for 1 week follow-up. She has scattered wounds on her lower extremities bilaterally. She did take furosemide twice in the past week. She does not recall having to urinate more frequently. She tolerated the 3 layer compression well. She denies signs of infection. 10/13; patient has not been here recently because of [place]. Apparently the facility was only putting gauze on her legs. This is a patient I do not normally see. 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 Ronnald Ramp at [facility] dermatology. She is quite convincing that she did not have radiation to her lower legs. It is likely she also has significant chronic venous insufficiency stasis dermatitis. We have been using silver alginate under kerlix Coban. She has obvious open areas medially on the right and areas on the left. She also has areas of extensive dry flaking adherent areas on the right and to a lesser  extent on the left anterior. Nodular areas on the left lateral lower leg left posterior calf and right mid calf medially. 10/21; patient presents for follow-up. She has no issues or complaints today. She has tolerated the 3 layer compression well bilaterally. She denies signs of infection. 10/27; patient presents for follow-up. She continues to tolerate the 3 layer compression wrap well. She denies signs of infection. 11/30; patient presents for follow-up. She has no issues or complaints today. 11/10; patient arrived in clinic today for nurse visit accompanied by her daughter from [place]. The daughter had multiple questions so we turned this into doctors visit. Apparently after the last time I saw this woman in October she went to see Dr. Ronnald Ramp but he did not take the wraps off. She previously was treated with Cemiplimab for her squamous cell carcinoma. She did not receive radiation to her legs. I had wanted Dr. Ronnald Ramp to look at this because of the exceptionally damaged skin on her lower legs bilaterally. She has odd looking wounds on the left medial lower leg also extending posteriorly which we have not made a lot of progress on. But she also has a raised hyperkeratotic nodules on the right anterior tibial area plaques on the left medial lower thigh. These are not areas that are under compression. We have been using silver alginate on any open areas Liberal TCA under 3 layer compression. 11/17; patient presents for follow-up. She had her wraps taken off yesterday for her dermatology appointment. She reports excessive weeping to her legs bilaterally after the wraps were  taken off. She currently denies signs of infection. 12/12/2020 upon evaluation today patient appears to be doing about the same in regard to her wounds. She was actually started on Cipro after having biopsies apparently at her dermatology clinic she does not have the results back from the actual biopsy but the culture did return and  apparently they placed her on the Cipro she started that just this morning. Other than that her legs appear to be doing okay at this time. 12/1; patient presents for follow-up. She has no issues or complaints today. She has started Lasix 40 mg daily to help with her bilateral leg swelling. 12/8; patient presents for follow-up. She has no issues or complaints today. 12/15; patient presents for 1 week follow-up. She has no issues or complaints today. 12/22; patient presents for follow-up. She has no issues or complaints today. 1/5; patient presents for follow-up. She has no issues or complaints today. She has tolerated the compression wraps well. 1/12; patient presents for follow-up. She is tolerated the compression wrap well and has no issues or complaints today. She denies signs of infection. 1/19; patient presents for follow-up. She took the compression wraps off 1 to 2 days ago to take a shower and did not have compression wraps replaced. She reports increased blistering throughout her legs bilaterally. She currently denies signs of infection. 1/26; patient presents for follow-up. She has no issues or complaints today. She tolerated the compression wraps well. She has not heard from oncology to schedule an appointment. Electronic Signature(s) Signed: 02/14/2021 2:40:32 PM By: Kalman Shan DO Entered By: Kalman Shan on 02/14/2021 14:35:28 -------------------------------------------------------------------------------- Physical Exam Details Patient Name: Date of Service: [name], [place]. 02/14/2021 1:30 PM Medical Record Number: [id number] Patient Account Number: [id number] Date of Birth/Sex: Treating RN: 11-16-1925 (88 y.o. [name] Primary Care Provider: [name] Other Clinician: Referring Provider: Treating Provider/Extender: [name] in Treatment: 35 Constitutional respirations regular, non-labored and within target range  for patient.[name] pleasant and cooperative. Notes Scattered wounds to her lower extremities bilaterally limited to skin breakdown. Areas appear well-healing. A suspicious lesion to the right anterior leg where She previously had squamous cell carcinoma. Electronic Signature(s) Signed: 02/14/2021 2:40:32 PM By: Kalman Shan DO Entered By: Kalman Shan on 02/14/2021 14:36:30 -------------------------------------------------------------------------------- Physician Orders Details Patient Name: Date of Service: [name], [place]. 02/14/2021 1:30 PM Medical Record Number: [id number] Patient Account Number: [id number] Date of Birth/Sex: Treating RN: 1925/03/31 (88 y.o. [name] Primary Care Provider: [name] Other Clinician: Referring Provider: Treating Provider/Extender: [name] in Treatment: 75 Verbal / Phone Orders: No Diagnosis Coding ICD-10 Coding Code Description C44.92 Squamous cell carcinoma of skin, unspecified L97.812 Non-pressure chronic ulcer of other part of right lower leg with fat layer exposed L97.822 Non-pressure chronic ulcer of other part of left lower leg with fat layer exposed I87.2 Venous insufficiency (chronic) (peripheral) I89.0 Lymphedema, not elsewhere classified Follow-up Appointments ppointment in 1 week. - Dr. Heber [place] Return A Bathing/ Shower/ Hygiene May shower with protection but do not get wound dressing(s) wet. - Use cast protector Edema Control - Lymphedema / SCD / Other Elevate legs to the level of the heart or above for 30 minutes daily and/or when sitting, a frequency of: - elevate the legs throughout the day heart level if possible. Avoid standing for long periods of time. Exercise regularly Additional Orders / Instructions Follow Nutritious Diet Wound Treatment Wound #19 - Lower Leg Wound  Laterality: Right, Circumferential Cleanser: Soap and Water 1 x Per Week/15  Days Discharge Instructions: May shower and wash wound with dial antibacterial soap and water prior to dressing change. Cleanser: Wound Cleanser 1 x Per Week/15 Days Discharge Instructions: Cleanse the wound with wound cleanser prior to applying a clean dressing using gauze sponges, not tissue or cotton balls. Peri-Wound Care: Triamcinolone 15 (g) 1 x Per Week/15 Days Discharge Instructions: In clinic only. Use triamcinolone 15 (g) in clinic only. Peri-Wound Care: Sween Lotion (Moisturizing lotion) 1 x Per Week/15 Days Discharge Instructions: Apply moisturizing lotion as directed Prim Dressing: KerraCel Ag Gelling Fiber Dressing, 4x5 in (silver alginate) 1 x Per Week/15 Days ary Discharge Instructions: Apply silver alginate to wound bed as instructed Secondary Dressing: Woven Gauze Sponge, Non-Sterile 4x4 in 1 x Per Week/15 Days Discharge Instructions: Apply over primary dressing as directed. Secondary Dressing: ABD Pad, 8x10 1 x Per Week/15 Days Discharge Instructions: Apply over primary dressing as directed. Compression Wrap: ThreePress (3 layer compression wrap) 1 x Per Week/15 Days Discharge Instructions: Apply three layer compression as directed. Wound #7R - Lower Leg Wound Laterality: Left, Circumferential Cleanser: Soap and Water 1 x Per Week/15 Days Discharge Instructions: May shower and wash wound with dial antibacterial soap and water prior to dressing change. Cleanser: Wound Cleanser 1 x Per Week/15 Days Discharge Instructions: Cleanse the wound with wound cleanser prior to applying a clean dressing using gauze sponges, not tissue or cotton balls. Peri-Wound Care: Triamcinolone 15 (g) 1 x Per Week/15 Days Discharge Instructions: In clinic only. Use triamcinolone 15 (g) in clinic only. Peri-Wound Care: Sween Lotion (Moisturizing lotion) 1 x Per Week/15 Days Discharge Instructions: Apply moisturizing lotion as directed Prim Dressing: KerraCel Ag Gelling Fiber Dressing, 4x5 in  (silver alginate) 1 x Per Week/15 Days ary Discharge Instructions: Apply silver alginate to wound bed as instructed Secondary Dressing: Woven Gauze Sponge, Non-Sterile 4x4 in 1 x Per Week/15 Days Discharge Instructions: Apply over primary dressing as directed. Secondary Dressing: ABD Pad, 8x10 1 x Per Week/15 Days Discharge Instructions: Apply over primary dressing as directed. Compression Wrap: ThreePress (3 layer compression wrap) 1 x Per Week/15 Days Discharge Instructions: Apply three layer compression as directed. Electronic Signature(s) Signed: 02/14/2021 2:40:32 PM By: Kalman Shan DO Entered By: Kalman Shan on 02/14/2021 14:37:00 -------------------------------------------------------------------------------- Problem List Details Patient Name: Date of Service: [name], [place]. 02/14/2021 1:30 PM Medical Record Number: [id number] Patient Account Number: [id number] Date of Birth/Sex: Treating RN: [birth year] (88 y.o. [name] Primary Care Provider: [name] Other Clinician: Referring Provider: Treating Provider/Extender: [name] in Treatment: 57 Active Problems ICD-10 Encounter Code Description Active Date MDM Diagnosis C44.92 Squamous cell carcinoma of skin, unspecified 06/11/2020 No Yes L97.812 Non-pressure chronic ulcer of other part of right lower leg with fat layer 06/11/2020 No Yes exposed L97.822 Non-pressure chronic ulcer of other part of left lower leg with fat layer exposed5/23/2022 No Yes I87.2 Venous insufficiency (chronic) (peripheral) 06/11/2020 No Yes I89.0 Lymphedema, not elsewhere classified 01/31/2021 No Yes Inactive Problems Resolved Problems Electronic Signature(s) Signed: 02/14/2021 2:40:32 PM By: Kalman Shan DO Entered By: Kalman Shan on 02/14/2021 14:30:32 -------------------------------------------------------------------------------- Progress Note Details Patient Name: Date  of Service: [name], [place]. 02/14/2021 1:30 PM Medical Record Number: [id number] Patient Account Number: [id number] Date of Birth/Sex: Treating RN: 11-04-1925 (88 y.o. [name] Primary Care Provider: [name] Other Clinician: Referring Provider: Treating Provider/Extender: [name] in Treatment: [phone]  Subjective Chief Complaint Information obtained from Patient Bilateral lower extremity wounds that have been biopsied and positive for squamous cell carcinoma History of Present Illness (HPI) The following HPI elements were documented for the patient's wound: Location: left leg Admission 5/23 Ms. 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 She states that the wound on the right has been present for a year. The 1 on the left has opened up 1 month ago. She is followed with oncology for this issue as she had biopsies that showed squamous cell carcinoma. She is also seeing radiation oncology for treatment options. She presents today because she would like for her wounds to be healed by [place]. She currently denies signs of infection. 6/1; patient presents for 1 week follow-up. She states she has tolerated the leg wraps well. She states these do not bother her and is happy to continue with them. She is scheduled to see her oncologist today to go over treatment options for the bilateral lower extremity squamous cell carcinoma. Radiation is currently not a recommended option. Patient states she overall feels well. 6/22; patient presents for 3-week follow-up. She has tolerated the wraps well until her last wrap where she states they were uncomfortable. She attributes this to the home health nurse. She denies signs of  infection. She has started her first treatment of antibody infusions for her Bilateral lower extremity squamous cell carcinoma. She has no complaints today. 7/21; patient presents for 1 month follow-up. Unfortunately she has not had good experience with her wrap changes with home health. She would like to do her own dressing changes. She continues to do her antibody infusions. She denies signs of infection. 7/28; patient presents for 1 week follow-up. At last clinic visit she was switched to daily dressing changes due to issues with the wrap and home health placing them. Unfortunately she has developed weeping to her legs bilaterally. She would like to be placed in wraps today. She would also like to follow with [place] weekly for wrap changes instead of having home health change them. She denies signs of infection. 8/4; patient presents for 1 week follow-up. She has tolerated the Kerlix/Coban wraps well. She no longer has weeping to her legs. She took the wrap off 1 day before coming in to be able to take a shower. She has no issues or complaints today. She denies signs of infection. 8/18; patient presents for follow-up. Patient has tolerated the wraps well. She brought her Velcro compression wraps today. She has no issues or complaints today. She had her chemotherapy infusion yesterday without issues. She denies signs of infection. 8/25; patient presents for follow-up. She used her juxta lite compressions for the past week. It is unclear if she is able to put these on correctly since she states she has a hard time getting them to look right. She reports 2 open wounds. She currently denies signs of infection. 9/1; patient presents for 1 week follow-up. She has 1 open wound. She tolerated the compression wraps well. She currently denies signs of infection. 9/8; patient presents for 1 week follow-up. She has 2 open wounds 1 on each leg. She has tolerated the compression wrap well. She currently denies signs  of infection. 9/15; patient presents for 1 week follow-up. She now has 3 wounds. 2 on the  left and 1 on the right. She continues to tolerate the compression wrap well. She currently denies signs of infection. She has obtained furosemide by her primary care physician and would like to discuss when to take this. 9/22; patient presents for 1 week follow-up. She has scattered wounds on her lower extremities bilaterally. She did take furosemide twice in the past week. She does not recall having to urinate more frequently. She tolerated the 3 layer compression well. She denies signs of infection. 10/13; patient has not been here recently because of [place]. Apparently the facility was only putting gauze on her legs. This is a patient I do not normally see. 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 Ronnald Ramp at [facility] dermatology. She is quite convincing that she did not have radiation to her lower legs. It is likely she also has significant chronic venous insufficiency stasis dermatitis. We have been using silver alginate under kerlix Coban. She has obvious open areas medially on the right and areas on the left. She also has areas of extensive dry flaking adherent areas on the right and to a lesser extent on the left anterior. Nodular areas on the left lateral lower leg left posterior calf and right mid calf medially. 10/21; patient presents for follow-up. She has no issues or complaints today. She has tolerated the 3 layer compression well bilaterally. She denies signs of infection. 10/27; patient presents for follow-up. She continues to tolerate the 3 layer compression wrap well. She denies signs of infection. 11/30; patient presents for follow-up. She has no issues or complaints today. 11/10; patient arrived in clinic today for nurse visit accompanied by her daughter from [place]. The daughter had multiple questions so we turned  this into doctors visit. Apparently after the last time I saw this woman in October she went to see Dr. Ronnald Ramp but he did not take the wraps off. She previously was treated with Cemiplimab for her squamous cell carcinoma. She did not receive radiation to her legs. I had wanted Dr. Ronnald Ramp to look at this because of the exceptionally damaged skin on her lower legs bilaterally. She has odd looking wounds on the left medial lower leg also extending posteriorly which we have not made a lot of progress on. But she also has a raised hyperkeratotic nodules on the right anterior tibial area plaques on the left medial lower thigh. These are not areas that are under compression. We have been using silver alginate on any open areas Liberal TCA under 3 layer compression. 11/17; patient presents for follow-up. She had her wraps taken off yesterday for her dermatology appointment. She reports excessive weeping to her legs bilaterally after the wraps were taken off. She currently denies signs of infection. 12/12/2020 upon evaluation today patient appears to be doing about the same in regard to her wounds. She was actually started on Cipro after having biopsies apparently at her dermatology clinic she does not have the results back from the actual biopsy but the culture did return and apparently they placed her on the Cipro she started that just this morning. Other than that her legs appear to be doing okay at this time. 12/1; patient presents for follow-up. She has no issues or complaints today. She has started Lasix 40 mg daily to help with her bilateral leg swelling. 12/8; patient presents for follow-up. She has no issues or complaints today. 12/15; patient presents for 1 week follow-up.  She has no issues or complaints today. 12/22; patient presents for follow-up. She has no issues or complaints today. 1/5; patient presents for follow-up. She has no issues or complaints today. She has tolerated the compression wraps  well. 1/12; patient presents for follow-up. She is tolerated the compression wrap well and has no issues or complaints today. She denies signs of infection. 1/19; patient presents for follow-up. She took the compression wraps off 1 to 2 days ago to take a shower and did not have compression wraps replaced. She reports increased blistering throughout her legs bilaterally. She currently denies signs of infection. 1/26; patient presents for follow-up. She has no issues or complaints today. She tolerated the compression wraps well. She has not heard from oncology to schedule an appointment. Patient History Information obtained from Patient. Family History Unknown History. Social History Never smoker, Marital Status - Single, Alcohol Use - Never, Drug Use - No History, Caffeine Use - Never. Medical History Eyes Denies history of Cataracts, Optic Neuritis Ear/Nose/Mouth/Throat Denies history of Chronic sinus problems/congestion, Middle ear problems Hematologic/Lymphatic Patient has history of Anemia Denies history of Hemophilia, Human Immunodeficiency Virus, Lymphedema, Sickle Cell Disease Respiratory Denies history of Aspiration, Asthma, Chronic Obstructive Pulmonary Disease (COPD), Pneumothorax, Sleep Apnea, Tuberculosis Cardiovascular Patient has history of Arrhythmia - Atrial Flutter, A fibb, Congestive Heart Failure, Hypertension Denies history of Angina, Coronary Artery Disease, Deep Vein Thrombosis, Hypotension, Myocardial Infarction, Peripheral Arterial Disease, Peripheral Venous Disease, Phlebitis, Vasculitis Gastrointestinal Patient has history of Colitis Denies history of Cirrhosis , Crohnoos, Hepatitis A, Hepatitis B, Hepatitis C Endocrine Denies history of Type I Diabetes, Type II Diabetes Genitourinary Denies history of End Stage Renal Disease Immunological Denies history of Lupus Erythematosus, Raynaudoos, Scleroderma Integumentary (Skin) Denies history of History of  Burn Musculoskeletal Patient has history of Osteoarthritis Denies history of Gout, Rheumatoid Arthritis, Osteomyelitis Neurologic Denies history of Dementia, Neuropathy, Quadriplegia, Paraplegia, Seizure Disorder Oncologic Denies history of Received Chemotherapy, Received Radiation Hospitalization/Surgery History - removal of rod left leg. 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 Vitals Time Taken: 1:45 PM, Height: 65 in, Weight: 163 lbs, BMI: 27.1, Temperature: 97.7 F, Pulse: 65 bpm, Respiratory Rate: 18 breaths/min, Blood Pressure: 120/71 mmHg. Psychiatric pleasant and cooperative. General Notes: Scattered wounds to her lower extremities bilaterally limited to skin breakdown. Areas appear well-healing. A suspicious lesion to the right anterior leg where She previously had squamous cell carcinoma. Integumentary (Hair, Skin) Wound #19 status is Open. Original cause of wound was Blister. The date acquired was: 02/06/2021. The wound has been in treatment 1 weeks. The wound is located on the Right,Circumferential Lower Leg. The wound measures 20cm length x 15cm width x 0.4cm depth; 235.619cm^2 area and 94.248cm^3 volume. There is Fat Layer (Subcutaneous Tissue) exposed. There is no tunneling or undermining noted. There is a medium amount of serosanguineous drainage noted. The wound margin is distinct with the outline attached to the wound base. There is large (67-100%) red granulation within the wound bed. There is a small (1-33%) amount of necrotic tissue within the wound bed including Adherent Slough. Wound #7R status is Open. Original cause of wound was Blister. The date acquired was: 04/20/2020. The wound has been in treatment 35 weeks. The wound is located on the  Left,Circumferential Lower Leg. The wound measures 16cm length x 28cm width x 0.1cm depth; 351.858cm^2 area and 35.186cm^3 volume. There is Fat  Layer (Subcutaneous Tissue) exposed. There is no tunneling or undermining noted. There is a medium amount of serosanguineous drainage noted. The wound margin is distinct with the outline attached to the wound base. There is medium (34-66%) red, pink granulation within the wound bed. There is a small (1-33%) amount of necrotic tissue within the wound bed including Adherent Slough. Assessment Active Problems ICD-10 Squamous cell carcinoma of skin, unspecified Non-pressure chronic ulcer of other part of right lower leg with fat layer exposed Non-pressure chronic ulcer of other part of left lower leg with fat layer exposed Venous insufficiency (chronic) (peripheral) Lymphedema, not elsewhere classified Patient's wounds have a better appearance than last clinic visit. There are still multiple scattered wounds limited to skin breakdown however these appear well- healing. She does continue to have a suspicious lesion to the right anterior leg concerning for reoccurring SCC. We will reach out to her oncology office. We also gave her the number to call in case she has not heard back in the next week. For now we will continue with silver alginate under 3 layer compression. Procedures Wound #19 Pre-procedure diagnosis of Wound #19 is a Venous Leg Ulcer located on the Right,Circumferential Lower Leg . There was a Three Layer Compression Therapy Procedure by Lorrin Jackson, RN. Post procedure Diagnosis Wound #19: Same as Pre-Procedure Wound #7R Pre-procedure diagnosis of Wound #7R is a Malignant Wound located on the Left,Circumferential Lower Leg . There was a Three Layer Compression Therapy Procedure by Lorrin Jackson, RN. Post procedure Diagnosis Wound #7R: Same as Pre-Procedure Plan Follow-up Appointments: Return Appointment in 1 week. - Dr. Heber Pringle Bathing/  Shower/ Hygiene: May shower with protection but do not get wound dressing(s) wet. - Use cast protector Edema Control - Lymphedema / SCD / Other: Elevate legs to the level of the heart or above for 30 minutes daily and/or when sitting, a frequency of: - elevate the legs throughout the day heart level if possible. Avoid standing for long periods of time. Exercise regularly Additional Orders / Instructions: Follow Nutritious Diet WOUND #19: - Lower Leg Wound Laterality: Right, Circumferential Cleanser: Soap and Water 1 x Per Week/15 Days Discharge Instructions: May shower and wash wound with dial antibacterial soap and water prior to dressing change. Cleanser: Wound Cleanser 1 x Per Week/15 Days Discharge Instructions: Cleanse the wound with wound cleanser prior to applying a clean dressing using gauze sponges, not tissue or cotton balls. Peri-Wound Care: Triamcinolone 15 (g) 1 x Per Week/15 Days Discharge Instructions: In clinic only. Use triamcinolone 15 (g) in clinic only. Peri-Wound Care: Sween Lotion (Moisturizing lotion) 1 x Per Week/15 Days Discharge Instructions: Apply moisturizing lotion as directed Prim Dressing: KerraCel Ag Gelling Fiber Dressing, 4x5 in (silver alginate) 1 x Per Week/15 Days ary Discharge Instructions: Apply silver alginate to wound bed as instructed Secondary Dressing: Woven Gauze Sponge, Non-Sterile 4x4 in 1 x Per Week/15 Days Discharge Instructions: Apply over primary dressing as directed. Secondary Dressing: ABD Pad, 8x10 1 x Per Week/15 Days Discharge Instructions: Apply over primary dressing as directed. Com pression Wrap: ThreePress (3 layer compression wrap) 1 x Per Week/15 Days Discharge Instructions: Apply three layer compression as directed. WOUND #7R: - Lower Leg Wound Laterality: Left, Circumferential Cleanser: Soap and Water 1 x Per Week/15 Days Discharge Instructions: May shower and wash wound with dial antibacterial soap and water prior to  dressing change. Cleanser: Wound Cleanser 1 x Per Week/15 Days Discharge Instructions: Cleanse the wound with wound cleanser prior to applying a clean dressing  using gauze sponges, not tissue or cotton balls. Peri-Wound Care: Triamcinolone 15 (g) 1 x Per Week/15 Days Discharge Instructions: In clinic only. Use triamcinolone 15 (g) in clinic only. Peri-Wound Care: Sween Lotion (Moisturizing lotion) 1 x Per Week/15 Days Discharge Instructions: Apply moisturizing lotion as directed Prim Dressing: KerraCel Ag Gelling Fiber Dressing, 4x5 in (silver alginate) 1 x Per Week/15 Days ary Discharge Instructions: Apply silver alginate to wound bed as instructed Secondary Dressing: Woven Gauze Sponge, Non-Sterile 4x4 in 1 x Per Week/15 Days Discharge Instructions: Apply over primary dressing as directed. Secondary Dressing: ABD Pad, 8x10 1 x Per Week/15 Days Discharge Instructions: Apply over primary dressing as directed. Com pression Wrap: ThreePress (3 layer compression wrap) 1 x Per Week/15 Days Discharge Instructions: Apply three layer compression as directed. 1. Silver alginate under 3 layer compression 2. Follow-up in 1 week Electronic Signature(s) Signed: 02/14/2021 2:40:32 PM By: Kalman Shan DO Entered By: Kalman Shan on 02/14/2021 14:39:56 -------------------------------------------------------------------------------- HxROS Details Patient Name: Date of Service: [name], [place]. 02/14/2021 1:30 PM Medical Record Number: [id number] Patient Account Number: [id number] Date of Birth/Sex: Treating RN: 1925-10-07 (88 y.o. [name] Primary Care Provider: [name] Other Clinician: Referring Provider: Treating Provider/Extender: [name] in Treatment: 36 Information Obtained From Patient Eyes Medical History: Negative for: Cataracts; Optic Neuritis Ear/Nose/Mouth/Throat Medical History: Negative for: Chronic sinus  problems/congestion; Middle ear problems Hematologic/Lymphatic Medical History: Positive for: Anemia Negative for: Hemophilia; Human Immunodeficiency Virus; Lymphedema; Sickle Cell Disease Respiratory Medical History: Negative for: Aspiration; Asthma; Chronic Obstructive Pulmonary Disease (COPD); Pneumothorax; Sleep Apnea; Tuberculosis Cardiovascular Medical History: Positive for: Arrhythmia - Atrial Flutter, A fibb; Congestive Heart Failure; Hypertension Negative for: Angina; Coronary Artery Disease; Deep Vein Thrombosis; Hypotension; Myocardial Infarction; Peripheral Arterial Disease; Peripheral Venous Disease; Phlebitis; Vasculitis Past Medical History Notes: hyperlipidemia Gastrointestinal Medical History: Positive for: Colitis Negative for: Cirrhosis ; Crohns; Hepatitis A; Hepatitis B; Hepatitis C Endocrine Medical History: Negative for: Type I Diabetes; Type II Diabetes Past Medical History Notes: hypothyroidism Genitourinary Medical History: Negative for: End Stage Renal Disease Immunological Medical History: Negative for: Lupus Erythematosus; Raynauds; Scleroderma Integumentary (Skin) Medical History: Negative for: History of Burn Musculoskeletal Medical History: Positive for: Osteoarthritis Negative for: Gout; Rheumatoid Arthritis; Osteomyelitis Neurologic Medical History: Negative for: Dementia; Neuropathy; Quadriplegia; Paraplegia; Seizure Disorder Past Medical History Notes: lumbar spindylolysis Oncologic Medical History: Negative for: Received Chemotherapy; Received Radiation Past Medical History Notes: BLE squamous ceel carcionoma Immunizations Pneumococcal Vaccine: Received Pneumococcal Vaccination: Yes Received Pneumococcal Vaccination On or After 60th Birthday: No Implantable Devices None Hospitalization / Surgery History Type of Hospitalization/Surgery removal of rod left leg Family and Social History Unknown History: Yes; Never smoker;  Marital Status - Single; Alcohol Use: Never; Drug Use: No History; Caffeine Use: Never; Financial Concerns: No; Food, Clothing or Shelter Needs: No; Support System Lacking: No; Transportation Concerns: No Electronic Signature(s) Signed: 02/14/2021 2:40:32 PM By: Kalman Shan DO Signed: 02/14/2021 6:13:38 PM By: Levan Hurst RN, BSN Entered By: Kalman Shan on 02/14/2021 14:35:45 -------------------------------------------------------------------------------- [place] Details Patient Name: Date of Service: [name], Lillia A. 02/14/2021 Medical Record Number: [id number] Patient Account Number: [id number] Date of Birth/Sex: Treating RN: [birth year] (88 y.o. [name] Primary Care Provider: [name] Other Clinician: Referring Provider: Treating Provider/Extender: [name] in Treatment: 35 Diagnosis Coding ICD-10 Codes Code Description C44.92 Squamous cell carcinoma of skin, unspecified L97.812 Non-pressure chronic ulcer of other part of right lower leg with fat layer exposed H82.993  Non-pressure chronic ulcer of other part of left lower leg with fat layer exposed I87.2 Venous insufficiency (chronic) (peripheral) I89.0 Lymphedema, not elsewhere classified Facility Procedures CPT4: Code [id number] Description: 81 BILATERAL: Application of multi-layer venous compression system; leg (below knee), including ankle and t. ICD-10 Diagnosis Description L97.812 Non-pressure chronic ulcer of other part of right lower leg with fat layer exposed L97.822  Non-pressure chronic ulcer of other part of left lower leg with fat layer exposed Modifier: Quantity: 1 Physician Procedures : CPT4 Code Description Modifier [id number] - WC PHYS LEVEL 3 - EST PT ICD-10 Diagnosis Description L97.812 Non-pressure chronic ulcer of other part of right lower leg with fat layer exposed L97.822 Non-pressure chronic ulcer of other part of left  lower  leg with fat layer exposed C44.92 Squamous cell carcinoma of skin, unspecified I89.0 Lymphedema, not elsewhere classified Quantity: 1 Electronic Signature(s) Signed: 02/14/2021 2:40:32 PM By: Kalman Shan DO Entered By: Kalman Shan on 02/14/2021 14:40:17

## 2021-02-21 ENCOUNTER — Other Ambulatory Visit: Payer: Self-pay

## 2021-02-21 ENCOUNTER — Encounter (HOSPITAL_BASED_OUTPATIENT_CLINIC_OR_DEPARTMENT_OTHER): Payer: Medicare Other | Attending: Internal Medicine | Admitting: Internal Medicine

## 2021-02-21 DIAGNOSIS — E785 Hyperlipidemia, unspecified: Secondary | ICD-10-CM | POA: Diagnosis not present

## 2021-02-21 DIAGNOSIS — I872 Venous insufficiency (chronic) (peripheral): Secondary | ICD-10-CM | POA: Diagnosis not present

## 2021-02-21 DIAGNOSIS — E039 Hypothyroidism, unspecified: Secondary | ICD-10-CM | POA: Diagnosis not present

## 2021-02-21 DIAGNOSIS — I509 Heart failure, unspecified: Secondary | ICD-10-CM | POA: Diagnosis not present

## 2021-02-21 DIAGNOSIS — C4492 Squamous cell carcinoma of skin, unspecified: Secondary | ICD-10-CM | POA: Diagnosis not present

## 2021-02-21 DIAGNOSIS — I890 Lymphedema, not elsewhere classified: Secondary | ICD-10-CM | POA: Insufficient documentation

## 2021-02-21 DIAGNOSIS — L97822 Non-pressure chronic ulcer of other part of left lower leg with fat layer exposed: Secondary | ICD-10-CM | POA: Diagnosis not present

## 2021-02-21 DIAGNOSIS — L97812 Non-pressure chronic ulcer of other part of right lower leg with fat layer exposed: Secondary | ICD-10-CM | POA: Insufficient documentation

## 2021-02-21 DIAGNOSIS — I110 Hypertensive heart disease with heart failure: Secondary | ICD-10-CM | POA: Diagnosis not present

## 2021-02-22 ENCOUNTER — Telehealth: Payer: Self-pay | Admitting: Oncology

## 2021-02-22 NOTE — Progress Notes (Signed)
[name] ([id number]) , Visit Report for 02/21/2021 Arrival Information Details Patient Name: Date of Service: [name], [place]. 02/21/2021 1:00 PM Medical Record Number: [id number] Patient Account Number: [id number] Date of Birth/Sex: Treating RN: 1925/09/20 (88 y.o. [name], [name] Primary Care Adriane Guglielmo: [name] Other Clinician: Referring Staphanie Harbison: Treating Tanica Gaige/Extender: [name] in Treatment: 29 Visit Information History Since Last Visit Added or deleted any medications: No Patient Arrived: Walker Any new allergies or adverse reactions: No Arrival Time: 13:33 Had a fall or experienced change in No Accompanied By: self activities of daily living that may affect Transfer Assistance: None risk of falls: Patient Identification Verified: Yes Signs or symptoms of abuse/neglect since last visito No Secondary Verification Process Completed: Yes Hospitalized since last visit: No Patient Requires Transmission-Based No Implantable device outside of the clinic excluding No Precautions: cellular tissue based products placed in the center Patient Has Alerts: Yes since last visit: Patient Alerts: R ABI = Non compressible Has Dressing in Place as Prescribed: Yes L ABI = Non compressible Has Compression in Place as Prescribed: Yes Pain Present Now: No Electronic Signature(s) Signed: 02/21/2021 4:09:06 PM By: Deon Pilling RN, BSN Entered By: Deon Pilling on 02/21/2021 13:34:17 -------------------------------------------------------------------------------- Compression Therapy Details Patient Name: Date of Service: [name], [name] 02/21/2021 1:00 PM Medical Record Number: [id number] Patient Account Number: [id number] Date of Birth/Sex: Treating RN: [birth year] (88 y.o. [name] Primary Care Adalaide Jaskolski: [name] Other Clinician: Referring Temisha Murley: Treating Kiyoto Slomski/Extender: [name] in Treatment: 61 Compression Therapy Performed for Wound Assessment: Wound #19 Right,Circumferential Lower Leg Performed By: Clinician Deon Pilling, RN Compression Type: Three Layer Post Procedure Diagnosis Same as Pre-procedure Electronic Signature(s) Signed: 02/21/2021 4:09:06 PM By: Deon Pilling RN, BSN Entered By: Deon Pilling on 02/21/2021 13:58:06 -------------------------------------------------------------------------------- Compression Therapy Details Patient Name: Date of Service: [name], [name]. 02/21/2021 1:00 PM Medical Record Number: [id number] Patient Account Number: [id number] Date of Birth/Sex: Treating RN: October 17, 1925 (88 y.o. [name] Primary Care Aliza Moret: [name] Other Clinician: Referring Dustina Scoggin: Treating Moyses Pavey/Extender: [name] in Treatment: 20 Compression Therapy Performed for Wound Assessment: Wound #7R Left,Circumferential Lower Leg Performed By: Clinician Deon Pilling, RN Compression Type: Three Layer Post Procedure Diagnosis Same as Pre-procedure Electronic Signature(s) Signed: 02/21/2021 4:09:06 PM By: Deon Pilling RN, BSN Entered By: Deon Pilling on 02/21/2021 13:58:06 -------------------------------------------------------------------------------- Encounter Discharge Information Details Patient Name: Date of Service: [name], [name]. 02/21/2021 1:00 PM Medical Record Number: [id number] Patient Account Number: [id number] Date of Birth/Sex: Treating RN: Apr 25, 1925 (88 y.o. [name] Primary Care Chadwin Fury: [name] Other Clinician: Referring Taila Basinski: Treating [name]/Extender: [name] in Treatment: 38 Encounter Discharge Information Items Discharge Condition: Stable Ambulatory Status: Walker Discharge Destination: Home Transportation: Private Auto Accompanied By: self Schedule Follow-up Appointment: Yes Clinical  Summary of Care: Electronic Signature(s) Signed: 02/21/2021 4:09:06 PM By: Deon Pilling RN, BSN Entered By: Deon Pilling on 02/21/2021 14:02:40 -------------------------------------------------------------------------------- Lower Extremity Assessment Details Patient Name: Date of Service: [name], [name]. 02/21/2021 1:00 PM Medical Record Number: [id number] Patient Account Number: [id number] Date of Birth/Sex: Treating RN: 1925/04/24 (88 y.o. [name] Primary Care Sabiha Sura: [name] Other Clinician: Referring Cloyde Oregel: Treating Kino Dunsworth/Extender: [name] in Treatment: 36 Edema Assessment Assessed: [Left: Yes] [Right: Yes] Edema: [Left: No] [Right: No] Calf Left: Right: Point of Measurement: 30 cm From Medial Instep 33 cm  31 cm Ankle Left: Right: Point of Measurement: 9 cm From Medial Instep 23 cm 23 cm Vascular Assessment Pulses: Dorsalis Pedis Palpable: [Left:Yes] [Right:Yes] Electronic Signature(s) Signed: 02/21/2021 4:09:06 PM By: Deon Pilling RN, BSN Entered By: Deon Pilling on 02/21/2021 13:42:23 -------------------------------------------------------------------------------- Multi Wound Chart Details Patient Name: Date of Service: [name], [name] 02/21/2021 1:00 PM Medical Record Number: [id number] Patient Account Number: [id number] Date of Birth/Sex: Treating RN: [birth year] (88 y.o. [name] Primary Care Annaliesa Blann: [name] Other Clinician: Referring Marsena Taff: Treating Cai Anfinson/Extender: [name] in Treatment: 43 Vital Signs Height(in): 65 Pulse(bpm): 79 Weight(lbs): 163 Blood Pressure(mmHg): 151/84 Body Mass Index(BMI): 27.1 Temperature(F): 98.4 Respiratory Rate(breaths/min): 20 Photos: [N/A:N/A] Right, Circumferential Lower Leg Left, Circumferential Lower Leg N/A Wound Location: Blister Blister N/A Wounding Event: Venous Leg Ulcer  Malignant Wound N/A Primary Etiology: Anemia, Arrhythmia, Congestive Heart Anemia, Arrhythmia, Congestive Heart N/A Comorbid History: Failure, Hypertension, Colitis, Failure, Hypertension, Colitis, Osteoarthritis Osteoarthritis 02/06/2021 04/20/2020 N/A Date Acquired: 2 36 N/A Weeks of Treatment: Open Open N/A Wound Status: No Yes N/A Wound Recurrence: Yes Yes N/A Clustered Wound: 3 3 N/A Clustered Quantity: 4x15x0.1 6x12x0.1 N/A Measurements L x W x D (cm) 47.124 56.549 N/A A (cm) : rea 4.712 5.655 N/A Volume (cm) : 90.70% -1741.40% N/A % Reduction in Area: 98.10% -1742.00% N/A % Reduction in Volume: Full Thickness Without Exposed Full Thickness Without Exposed N/A Classification: Support Structures Support Structures Medium Medium N/A Exudate Amount: Serosanguineous Serosanguineous N/A Exudate Type: red, brown red, brown N/A Exudate Color: Distinct, outline attached Distinct, outline attached N/A Wound Margin: Large (67-100%) Large (67-100%) N/A Granulation Amount: Red Red, Pink N/A Granulation Quality: None Present (0%) None Present (0%) N/A Necrotic Amount: Fat Layer (Subcutaneous Tissue): Yes Fat Layer (Subcutaneous Tissue): Yes N/A Exposed Structures: Fascia: No Fascia: No Tendon: No Tendon: No Muscle: No Muscle: No Joint: No Joint: No Bone: No Bone: No Large (67-100%) Large (67-100%) N/A Epithelialization: Compression Therapy Compression Therapy N/A Procedures Performed: Treatment Notes Wound #19 (Lower Leg) Wound Laterality: Right, Circumferential Cleanser Soap and Water Discharge Instruction: May shower and wash wound with dial antibacterial soap and water prior to dressing change. Wound Cleanser Discharge Instruction: Cleanse the wound with wound cleanser prior to applying a clean dressing using gauze sponges, not tissue or cotton balls. Peri-Wound Care Triamcinolone 15 (g) Discharge Instruction: In clinic only. Use triamcinolone 15 (g)  in clinic only. Sween Lotion (Moisturizing lotion) Discharge Instruction: Apply moisturizing lotion as directed Topical Gentamicin Discharge Instruction: As directed by physician Primary Dressing KerraCel Ag Gelling Fiber Dressing, 4x5 in (silver alginate) Discharge Instruction: Apply silver alginate to wound bed as instructed Secondary Dressing Woven Gauze Sponge, Non-Sterile 4x4 in Discharge Instruction: Apply over primary dressing as directed. ABD Pad, 8x10 Discharge Instruction: Apply over primary dressing as directed. Secured With Compression Wrap ThreePress (3 layer compression wrap) Discharge Instruction: Apply three layer compression as directed. Compression Stockings Add-Ons Wound #7R (Lower Leg) Wound Laterality: Left, Circumferential Cleanser Soap and Water Discharge Instruction: May shower and wash wound with dial antibacterial soap and water prior to dressing change. Wound Cleanser Discharge Instruction: Cleanse the wound with wound cleanser prior to applying a clean dressing using gauze sponges, not tissue or cotton balls. Peri-Wound Care Triamcinolone 15 (g) Discharge Instruction: In clinic only. Use triamcinolone 15 (g) in clinic only. Sween Lotion (Moisturizing lotion) Discharge Instruction: Apply moisturizing lotion as directed Topical Gentamicin Discharge Instruction: As directed by physician Primary Dressing KerraCel Ag Gelling Fiber Dressing, 4x5 in (  silver alginate) Discharge Instruction: Apply silver alginate to wound bed as instructed Secondary Dressing Woven Gauze Sponge, Non-Sterile 4x4 in Discharge Instruction: Apply over primary dressing as directed. ABD Pad, 8x10 Discharge Instruction: Apply over primary dressing as directed. Secured With Compression Wrap ThreePress (3 layer compression wrap) Discharge Instruction: Apply three layer compression as directed. Compression Stockings Add-Ons Electronic Signature(s) Signed: 02/21/2021 2:33:37 PM  By: Kalman Shan DO Signed: 02/22/2021 1:32:11 PM By: Fara Chute By: Kalman Shan on 02/21/2021 14:29:41 -------------------------------------------------------------------------------- [place] Details Patient Name: Date of Service: [name], [place]. 02/21/2021 1:00 PM Medical Record Number: [id number] Patient Account Number: [id number] Date of Birth/Sex: Treating RN: [birth year] (88 y.o. [name] Primary Care Dela Sweeny: [name] Other Clinician: Referring Bernerd Terhune: Treating Sheniah Supak/Extender: [name] in Treatment: 30 Multidisciplinary Care Plan reviewed with physician Active Inactive Venous Leg Ulcer Nursing Diagnoses: Knowledge deficit related to disease process and management Potential for venous Insuffiency (use before diagnosis confirmed) Goals: Patient will maintain optimal edema control Date Initiated: 11/01/2020 Target Resolution Date: 03/22/2021 Goal Status: Active Interventions: Assess peripheral edema status every visit. Compression as ordered Provide education on venous insufficiency Treatment Activities: Therapeutic compression applied : 11/01/2020 Notes: 12/27/20: Edema control ongoing 01/24/21: Edema control continues, using 3 layer compression Wound/Skin Impairment Nursing Diagnoses: Impaired tissue integrity Knowledge deficit related to ulceration/compromised skin integrity Goals: Patient/caregiver will verbalize understanding of skin care regimen Date Initiated: 06/11/2020 Target Resolution Date: 03/22/2021 Goal Status: Active Interventions: Assess patient/caregiver ability to obtain necessary supplies Assess patient/caregiver ability to perform ulcer/skin care regimen upon admission and as needed Assess ulceration(s) every visit Provide education on ulcer and skin care Notes: 10/11/20: Wound care regimen ongoing. 12/27/20: Wound care continues Electronic  Signature(s) Signed: 02/21/2021 4:09:06 PM By: Deon Pilling RN, BSN Entered By: Deon Pilling on 02/21/2021 14:01:18 -------------------------------------------------------------------------------- Pain Assessment Details Patient Name: Date of Service: [name], [place]. 02/21/2021 1:00 PM Medical Record Number: [id number] Patient Account Number: [id number] Date of Birth/Sex: Treating RN: February 25, 1925 (88 y.o. [name] Primary Care Daffney Greenly: [name] Other Clinician: Referring Kayliee Atienza: Treating Marice Guidone/Extender: [name] in Treatment: 77 Active Problems Location of Pain Severity and Description of Pain Patient Has Paino No Site Locations Rate the pain. Current Pain Level: 0 Pain Management and Medication Current Pain Management: Medication: No Cold Application: No Rest: No Massage: No Activity: No T.E.N.S.: No Heat Application: No Leg drop or elevation: No Is the Current Pain Management Adequate: Adequate How does your wound impact your activities of daily livingo Sleep: No Bathing: No Appetite: No Relationship With Others: No Bladder Continence: No Emotions: No Bowel Continence: No Work: No Toileting: No Drive: No Dressing: No Hobbies: No Engineer, maintenance) Signed: 02/21/2021 4:09:06 PM By: Deon Pilling RN, BSN Entered By: Deon Pilling on 02/21/2021 13:34:36 -------------------------------------------------------------------------------- Patient/Caregiver Education Details Patient Name: Date of Service: [name], [place]ael[place] A. 2/2/2023andnbsp1:00 PM Medical Record Number: [id number] Patient Account Number: [id number] Date of Birth/Gender: Treating RN: 11-Sep-1925 (88 y.o. [name] Primary Care Physician: [name] Other Clinician: Referring Physician: Treating Physician/Extender: [name] in Treatment: 45 Education Assessment Education Provided  To: Patient Education Topics Provided Wound/Skin Impairment: Handouts: Skin Care Do's and Dont's Methods: Explain/Verbal Responses: Reinforcements needed Electronic Signature(s) Signed: 02/21/2021 4:09:06 PM By: Deon Pilling RN, BSN Entered By: Deon Pilling on 02/21/2021 14:01:39 -------------------------------------------------------------------------------- Wound Assessment Details Patient Name: Date of Service: [name], [name]. 02/21/2021 1:00 PM Medical Record  Number: [id number] Patient Account Number: [id number] Date of Birth/Sex: Treating RN: 15-Nov-1925 (88 y.o. [name], [name] Primary Care Rodarius Kichline: [name] Other Clinician: Referring Harrison Paulson: Treating Mikyla Schachter/Extender: [name] in Treatment: 36 Wound Status Wound Number: 19 Primary Venous Leg Ulcer Etiology: Wound Location: Right, Circumferential Lower Leg Wound Open Wounding Event: Blister Status: Date Acquired: 02/06/2021 Comorbid Anemia, Arrhythmia, Congestive Heart Failure, Hypertension, Weeks Of Treatment: 2 History: Colitis, Osteoarthritis Clustered Wound: Yes Photos Wound Measurements Length: (cm) 4 Width: (cm) 15 Depth: (cm) 0.1 Clustered Quantity: 3 Area: (cm) 47.124 Volume: (cm) 4.712 % Reduction in Area: 90.7% % Reduction in Volume: 98.1% Epithelialization: Large (67-100%) Tunneling: No Undermining: No Wound Description Classification: Full Thickness Without Exposed Support Structures Wound Margin: Distinct, outline attached Exudate Amount: Medium Exudate Type: Serosanguineous Exudate Color: red, brown Foul Odor After Cleansing: No Slough/Fibrino No Wound Bed Granulation Amount: Large (67-100%) Exposed Structure Granulation Quality: Red Fascia Exposed: No Necrotic Amount: None Present (0%) Fat Layer (Subcutaneous Tissue) Exposed: Yes Tendon Exposed: No Muscle Exposed: No Joint Exposed: No Bone Exposed: No Treatment Notes Wound #19  (Lower Leg) Wound Laterality: Right, Circumferential Cleanser Soap and Water Discharge Instruction: May shower and wash wound with dial antibacterial soap and water prior to dressing change. Wound Cleanser Discharge Instruction: Cleanse the wound with wound cleanser prior to applying a clean dressing using gauze sponges, not tissue or cotton balls. Peri-Wound Care Triamcinolone 15 (g) Discharge Instruction: In clinic only. Use triamcinolone 15 (g) in clinic only. Sween Lotion (Moisturizing lotion) Discharge Instruction: Apply moisturizing lotion as directed Topical Gentamicin Discharge Instruction: As directed by physician Primary Dressing KerraCel Ag Gelling Fiber Dressing, 4x5 in (silver alginate) Discharge Instruction: Apply silver alginate to wound bed as instructed Secondary Dressing Woven Gauze Sponge, Non-Sterile 4x4 in Discharge Instruction: Apply over primary dressing as directed. ABD Pad, 8x10 Discharge Instruction: Apply over primary dressing as directed. Secured With Compression Wrap ThreePress (3 layer compression wrap) Discharge Instruction: Apply three layer compression as directed. Compression Stockings Add-Ons Electronic Signature(s) Signed: 02/21/2021 4:09:06 PM By: Deon Pilling RN, BSN Entered By: Deon Pilling on 02/21/2021 13:51:54 -------------------------------------------------------------------------------- Wound Assessment Details Patient Name: Date of Service: [name], [place]. 02/21/2021 1:00 PM Medical Record Number: [id number] Patient Account Number: [id number] Date of Birth/Sex: Treating RN: 07-31-1925 (88 y.o. [name], [name] Primary Care Smith Mcnicholas: [name] Other Clinician: Referring Jaydin Boniface: Treating Analiyah Lechuga/Extender: [name] in Treatment: 36 Wound Status Wound Number: 7R Primary Malignant Wound Etiology: Wound Location: Left, Circumferential Lower Leg Wound Open Wounding Event:  Blister Status: Date Acquired: 04/20/2020 Comorbid Anemia, Arrhythmia, Congestive Heart Failure, Hypertension, Weeks Of Treatment: 36 History: Colitis, Osteoarthritis Clustered Wound: Yes Photos Wound Measurements Length: (cm) 6 Width: (cm) 12 Depth: (cm) 0.1 Clustered Quantity: 3 Area: (cm) 56 Volume: (cm) 5. % Reduction in Area: -1741.4% % Reduction in Volume: -1742% Epithelialization: Large (67-100%) Tunneling: No .549 Undermining: No 655 Wound Description Classification: Full Thickness Without Exposed Support Structures Wound Margin: Distinct, outline attached Exudate Amount: Medium Exudate Type: Serosanguineous Exudate Color: red, brown Foul Odor After Cleansing: No Slough/Fibrino No Wound Bed Granulation Amount: Large (67-100%) Exposed Structure Granulation Quality: Red, Pink Fascia Exposed: No Necrotic Amount: None Present (0%) Fat Layer (Subcutaneous Tissue) Exposed: Yes Tendon Exposed: No Muscle Exposed: No Joint Exposed: No Bone Exposed: No Treatment Notes Wound #7R (Lower Leg) Wound Laterality: Left, Circumferential Cleanser Soap and Water Discharge Instruction: May shower and wash wound with dial antibacterial soap and water prior to  dressing change. Wound Cleanser Discharge Instruction: Cleanse the wound with wound cleanser prior to applying a clean dressing using gauze sponges, not tissue or cotton balls. Peri-Wound Care Triamcinolone 15 (g) Discharge Instruction: In clinic only. Use triamcinolone 15 (g) in clinic only. Sween Lotion (Moisturizing lotion) Discharge Instruction: Apply moisturizing lotion as directed Topical Gentamicin Discharge Instruction: As directed by physician Primary Dressing KerraCel Ag Gelling Fiber Dressing, 4x5 in (silver alginate) Discharge Instruction: Apply silver alginate to wound bed as instructed Secondary Dressing Woven Gauze Sponge, Non-Sterile 4x4 in Discharge Instruction: Apply over primary dressing as  directed. ABD Pad, 8x10 Discharge Instruction: Apply over primary dressing as directed. Secured With Compression Wrap ThreePress (3 layer compression wrap) Discharge Instruction: Apply three layer compression as directed. Compression Stockings Add-Ons Electronic Signature(s) Signed: 02/21/2021 4:09:06 PM By: Deon Pilling RN, BSN Entered By: Deon Pilling on 02/21/2021 13:52:23 -------------------------------------------------------------------------------- Vitals Details Patient Name: Date of Service: [name], [place]. 02/21/2021 1:00 PM Medical Record Number: [id number] Patient Account Number: [id number] Date of Birth/Sex: Treating RN: 05-Dec-1925 (88 y.o. [name], [name] Primary Care Lily Velasquez: [name] Other Clinician: Referring Tyia Binford: Treating Neli Fofana/Extender: [name] in Treatment: 68 Vital Signs Time Taken: 13:30 Temperature (F): 98.4 Height (in): 65 Pulse (bpm): 84 Weight (lbs): 163 Respiratory Rate (breaths/min): 20 Body Mass Index (BMI): 27.1 Blood Pressure (mmHg): 151/84 Reference Range: 80 - 120 mg / dl Electronic Signature(s) Signed: 02/21/2021 4:09:06 PM By: Deon Pilling RN, BSN Entered By: Deon Pilling on 02/21/2021 13:34:29

## 2021-02-22 NOTE — Telephone Encounter (Signed)
Scheduled appt per 2/3 referral. Pt is aware of appt date and time. Pt is aware to arrive 20 mins prior to appt time.

## 2021-02-22 NOTE — Progress Notes (Signed)
[name] ([id number]) , Visit Report for 02/21/2021 Chief Complaint Document Details Patient Name: Date of Service: [name], [place]. 02/21/2021 1:00 PM Medical Record Number: [id number] Patient Account Number: [id number] Date of Birth/Sex: Treating RN: 05/18/1925 (88 y.o. [name] Primary Care Provider: [name] Other Clinician: Referring Provider: Treating Provider/Extender: [name] in Treatment: 82 Information Obtained from: Patient Chief Complaint Bilateral lower extremity wounds that have been biopsied and positive for squamous cell carcinoma Electronic Signature(s) Signed: 02/21/2021 2:33:37 PM By: Kalman Shan DO Entered By: Kalman Shan on 02/21/2021 14:29:56 -------------------------------------------------------------------------------- HPI Details Patient Name: Date of Service: [name], [name] 02/21/2021 1:00 PM Medical Record Number: [id number] Patient Account Number: [id number] Date of Birth/Sex: Treating RN: [birth year] (88 y.o. [name] Primary Care Provider: [name] Other Clinician: Referring Provider: Treating Provider/Extender: [name] in Treatment: 50 History of Present Illness Location: left leg HPI Description: Admission 5/23 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 She states that the wound on the right has been present for a year. The 1 on the left has opened up 1 month ago. She is followed with oncology for this issue as she had biopsies that showed squamous cell carcinoma. She is also seeing radiation oncology for treatment options. She presents today because she would like for her wounds to be healed by  [place]. She currently denies signs of infection. 6/1; patient presents for 1 week follow-up. She states she has tolerated the leg wraps well. She states these do not bother her and is happy to continue with them. She is scheduled to see her oncologist today to go over treatment options for the bilateral lower extremity squamous cell carcinoma. Radiation is currently not a recommended option. Patient states she overall feels well. 6/22; patient presents for 3-week follow-up. She has tolerated the wraps well until her last wrap where she states they were uncomfortable. She attributes this to the home health nurse. She denies signs of infection. She has started her first treatment of antibody infusions for her Bilateral lower extremity squamous cell carcinoma. She has no complaints today. 7/21; patient presents for 1 month follow-up. Unfortunately she has not had good experience with her wrap changes with home health. She would like to do her own dressing changes. She continues to do her antibody infusions. She denies signs of infection. 7/28; patient presents for 1 week follow-up. At last clinic visit she was switched to daily dressing changes due to issues with the wrap and home health placing them. Unfortunately she has developed weeping to her legs bilaterally. She would like to be placed in wraps today. She would also like to follow with [place] weekly for wrap changes instead of having home health change them. She denies signs of infection. 8/4; patient presents for 1 week follow-up. She has tolerated the Kerlix/Coban wraps well. She no longer has weeping to her legs. She took the wrap off 1 day before coming in to be able to take a shower. She has no issues or complaints today. She denies signs of infection. 8/18; patient presents for follow-up. Patient has tolerated the wraps well. She brought her Velcro compression wraps today. She has no issues or complaints today. She had her chemotherapy infusion  yesterday without issues. She denies signs of infection. 8/25; patient presents for follow-up. She used her juxta lite compressions for the past week. It is unclear if she is able to put these on correctly since she states she has a hard time getting them to look right. She reports 2 open wounds. She currently denies signs of infection. 9/1; patient presents for 1 week follow-up. She has 1 open wound. She tolerated the compression wraps well. She currently denies signs of infection. 9/8; patient presents for 1 week follow-up. She has 2 open wounds 1 on each leg. She has tolerated the compression wrap well. She currently denies signs of infection. 9/15; patient presents for 1 week follow-up. She now has 3 wounds. 2 on the left and 1 on the right. She continues to tolerate the compression wrap well. She currently denies signs of infection. She has obtained furosemide by her primary care physician and would like to discuss when to take this. 9/22; patient presents for 1 week follow-up. She has scattered wounds on her lower extremities bilaterally. She did take furosemide twice in the past week. She does not recall having to urinate more frequently. She tolerated the 3 layer compression well. She denies signs of infection. 10/13; patient has not been here recently because of [place]. Apparently the facility was only putting gauze on her legs. This is a patient I do not normally see. 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 Ronnald Ramp at [facility] dermatology. She is quite convincing that she did not have radiation to her lower legs. It is likely she also has significant chronic venous insufficiency stasis dermatitis. We have been using silver alginate under kerlix Coban. She has obvious open areas medially on the right and areas on the left. She also has areas of extensive dry flaking adherent areas on the right and to a lesser  extent on the left anterior. Nodular areas on the left lateral lower leg left posterior calf and right mid calf medially. 10/21; patient presents for follow-up. She has no issues or complaints today. She has tolerated the 3 layer compression well bilaterally. She denies signs of infection. 10/27; patient presents for follow-up. She continues to tolerate the 3 layer compression wrap well. She denies signs of infection. 11/30; patient presents for follow-up. She has no issues or complaints today. 11/10; patient arrived in clinic today for nurse visit accompanied by her daughter from [place]. The daughter had multiple questions so we turned this into doctors visit. Apparently after the last time I saw this woman in October she went to see Dr. Ronnald Ramp but he did not take the wraps off. She previously was treated with Cemiplimab for her squamous cell carcinoma. She did not receive radiation to her legs. I had wanted Dr. Ronnald Ramp to look at this because of the exceptionally damaged skin on her lower legs bilaterally. She has odd looking wounds on the left medial lower leg also extending posteriorly which we have not made a lot of progress on. But she also has a raised hyperkeratotic nodules on the right anterior tibial area plaques on the left medial lower thigh. These are not areas that are under compression. We have been using silver alginate on any open areas Liberal TCA under 3 layer compression. 11/17; patient presents for follow-up. She had her wraps taken off yesterday for her dermatology appointment. She reports excessive weeping to her legs bilaterally after the wraps were  taken off. She currently denies signs of infection. 12/12/2020 upon evaluation today patient appears to be doing about the same in regard to her wounds. She was actually started on Cipro after having biopsies apparently at her dermatology clinic she does not have the results back from the actual biopsy but the culture did return and  apparently they placed her on the Cipro she started that just this morning. Other than that her legs appear to be doing okay at this time. 12/1; patient presents for follow-up. She has no issues or complaints today. She has started Lasix 40 mg daily to help with her bilateral leg swelling. 12/8; patient presents for follow-up. She has no issues or complaints today. 12/15; patient presents for 1 week follow-up. She has no issues or complaints today. 12/22; patient presents for follow-up. She has no issues or complaints today. 1/5; patient presents for follow-up. She has no issues or complaints today. She has tolerated the compression wraps well. 1/12; patient presents for follow-up. She is tolerated the compression wrap well and has no issues or complaints today. She denies signs of infection. 1/19; patient presents for follow-up. She took the compression wraps off 1 to 2 days ago to take a shower and did not have compression wraps replaced. She reports increased blistering throughout her legs bilaterally. She currently denies signs of infection. 1/26; patient presents for follow-up. She has no issues or complaints today. She tolerated the compression wraps well. She has not heard from oncology to schedule an appointment. 2/2; patient presents for follow-up. She has no issues or complaints today. She continues to tolerate the compression wrap well. Electronic Signature(s) Signed: 02/21/2021 2:33:37 PM By: Kalman Shan DO Entered By: Kalman Shan on 02/21/2021 14:30:37 -------------------------------------------------------------------------------- Physical Exam Details Patient Name: Date of Service: [name], [place]. 02/21/2021 1:00 PM Medical Record Number: [id number] Patient Account Number: [id number] Date of Birth/Sex: Treating RN: 1925-09-06 (88 y.o. [name] Primary Care Provider: [name] Other Clinician: Referring Provider: Treating Provider/Extender: [name] in Treatment: 34 Constitutional respirations regular, non-labored and within target range for patient.. Cardiovascular 2+ dorsalis pedis/posterior tibialis pulses. Psychiatric pleasant and cooperative. Notes Scattered wounds to her lower extremities bilaterally limited to skin breakdown. Areas appear well-healing. A suspicious lesion to the right anterior leg where She previously had squamous cell carcinoma. Electronic Signature(s) Signed: 02/21/2021 2:33:37 PM By: Kalman Shan DO Entered By: Kalman Shan on 02/21/2021 14:31:04 -------------------------------------------------------------------------------- Physician Orders Details Patient Name: Date of Service: [name], [place]. 02/21/2021 1:00 PM Medical Record Number: [id number] Patient Account Number: [id number] Date of Birth/Sex: Treating RN: 11/02/1925 (88 y.o. [name], [name] Primary Care Provider: [name] Other Clinician: Referring Provider: Treating Provider/Extender: [name] in Treatment: 23 Verbal / Phone Orders: No Diagnosis Coding ICD-10 Coding Code Description C44.92 Squamous cell carcinoma of skin, unspecified L97.812 Non-pressure chronic ulcer of other part of right lower leg with fat layer exposed L97.822 Non-pressure chronic ulcer of other part of left lower leg with fat layer exposed I87.2 Venous insufficiency (chronic) (peripheral) I89.0 Lymphedema, not elsewhere classified Follow-up Appointments ppointment in 2 weeks. - Dr. Heber Gould and [name], RN Return A Nurse Visit: - next week Other: - Patient to call [place] at [facility] [phone]. Bathing/ Shower/ Hygiene May shower with protection but do not get wound dressing(s) wet. - Use cast protector Edema Control - Lymphedema / SCD / Other Elevate legs to the level of the heart or above  for 30 minutes daily and/or when sitting, a frequency of: - elevate the legs  throughout the day heart level if possible. Avoid standing for long periods of time. Exercise regularly Additional Orders / Instructions Follow Nutritious Diet Wound Treatment Wound #19 - Lower Leg Wound Laterality: Right, Circumferential Cleanser: Soap and Water 1 x Per Week/15 Days Discharge Instructions: May shower and wash wound with dial antibacterial soap and water prior to dressing change. Cleanser: Wound Cleanser 1 x Per Week/15 Days Discharge Instructions: Cleanse the wound with wound cleanser prior to applying a clean dressing using gauze sponges, not tissue or cotton balls. Peri-Wound Care: Triamcinolone 15 (g) 1 x Per Week/15 Days Discharge Instructions: In clinic only. Use triamcinolone 15 (g) in clinic only. Peri-Wound Care: Sween Lotion (Moisturizing lotion) 1 x Per Week/15 Days Discharge Instructions: Apply moisturizing lotion as directed Topical: Gentamicin 1 x Per Week/15 Days Discharge Instructions: As directed by physician Prim Dressing: KerraCel Ag Gelling Fiber Dressing, 4x5 in (silver alginate) 1 x Per Week/15 Days ary Discharge Instructions: Apply silver alginate to wound bed as instructed Secondary Dressing: Woven Gauze Sponge, Non-Sterile 4x4 in 1 x Per Week/15 Days Discharge Instructions: Apply over primary dressing as directed. Secondary Dressing: ABD Pad, 8x10 1 x Per Week/15 Days Discharge Instructions: Apply over primary dressing as directed. Compression Wrap: ThreePress (3 layer compression wrap) 1 x Per Week/15 Days Discharge Instructions: Apply three layer compression as directed. Wound #7R - Lower Leg Wound Laterality: Left, Circumferential Cleanser: Soap and Water 1 x Per Week/15 Days Discharge Instructions: May shower and wash wound with dial antibacterial soap and water prior to dressing change. Cleanser: Wound Cleanser 1 x Per Week/15 Days Discharge Instructions: Cleanse the wound with wound cleanser prior to applying a clean dressing using gauze  sponges, not tissue or cotton balls. Peri-Wound Care: Triamcinolone 15 (g) 1 x Per Week/15 Days Discharge Instructions: In clinic only. Use triamcinolone 15 (g) in clinic only. Peri-Wound Care: Sween Lotion (Moisturizing lotion) 1 x Per Week/15 Days Discharge Instructions: Apply moisturizing lotion as directed Topical: Gentamicin 1 x Per Week/15 Days Discharge Instructions: As directed by physician Prim Dressing: KerraCel Ag Gelling Fiber Dressing, 4x5 in (silver alginate) 1 x Per Week/15 Days ary Discharge Instructions: Apply silver alginate to wound bed as instructed Secondary Dressing: Woven Gauze Sponge, Non-Sterile 4x4 in 1 x Per Week/15 Days Discharge Instructions: Apply over primary dressing as directed. Secondary Dressing: ABD Pad, 8x10 1 x Per Week/15 Days Discharge Instructions: Apply over primary dressing as directed. Compression Wrap: ThreePress (3 layer compression wrap) 1 x Per Week/15 Days Discharge Instructions: Apply three layer compression as directed. Electronic Signature(s) Signed: 02/21/2021 2:33:37 PM By: Kalman Shan DO Entered By: Kalman Shan on 02/21/2021 14:31:21 -------------------------------------------------------------------------------- Problem List Details Patient Name: Date of Service: [name], [place] 02/21/2021 1:00 PM Medical Record Number: [id number] Patient Account Number: [id number] Date of Birth/Sex: Treating RN: 1925-04-03 (88 y.o. [name], [name] Primary Care Provider: [name] Other Clinician: Referring Provider: Treating Provider/Extender: [name] in Treatment: 62 Active Problems ICD-10 Encounter Code Description Active Date MDM Diagnosis C44.92 Squamous cell carcinoma of skin, unspecified 06/11/2020 No Yes L97.812 Non-pressure chronic ulcer of other part of right lower leg with fat layer 06/11/2020 No Yes exposed L97.822 Non-pressure chronic ulcer of other part of left lower leg  with fat layer exposed5/23/2022 No Yes I87.2 Venous insufficiency (chronic) (peripheral) 06/11/2020 No Yes I89.0 Lymphedema, not elsewhere classified 01/31/2021 No Yes Inactive Problems Resolved Problems Electronic Signature(s)  Signed: 02/21/2021 2:33:37 PM By: Kalman Shan DO Entered By: Kalman Shan on 02/21/2021 14:29:33 -------------------------------------------------------------------------------- Progress Note Details Patient Name: Date of Service: [name], [place]. 02/21/2021 1:00 PM Medical Record Number: [id number] Patient Account Number: [id number] Date of Birth/Sex: Treating RN: 1925/09/04 (88 y.o. [name] Primary Care Provider: [name] Other Clinician: Referring Provider: Treating Provider/Extender: [name] in Treatment: 63 Subjective Chief Complaint Information obtained from Patient Bilateral lower extremity wounds that have been biopsied and positive for squamous cell carcinoma History of Present Illness (HPI) The following HPI elements were documented for the patient's wound: Location: left leg Admission 5/23 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 She states that the wound on the right has been present for a year. The 1 on the left has opened up 1 month ago. She is followed with oncology for this issue as she had biopsies that showed squamous cell carcinoma. She is also seeing radiation oncology for treatment options. She presents today because she would like for her wounds to be healed by [place]. She currently denies signs of infection. 6/1; patient presents for 1 week follow-up. She states she has tolerated the leg wraps well. She states these do not bother her and is happy to  continue with them. She is scheduled to see her oncologist today to go over treatment options for the bilateral lower extremity squamous cell carcinoma. Radiation is currently not a recommended option. Patient states she overall feels well. 6/22; patient presents for 3-week follow-up. She has tolerated the wraps well until her last wrap where she states they were uncomfortable. She attributes this to the home health nurse. She denies signs of infection. She has started her first treatment of antibody infusions for her Bilateral lower extremity squamous cell carcinoma. She has no complaints today. 7/21; patient presents for 1 month follow-up. Unfortunately she has not had good experience with her wrap changes with home health. She would like to do her own dressing changes. She continues to do her antibody infusions. She denies signs of infection. 7/28; patient presents for 1 week follow-up. At last clinic visit she was switched to daily dressing changes due to issues with the wrap and home health placing them. Unfortunately she has developed weeping to her legs bilaterally. She would like to be placed in wraps today. She would also like to follow with [place] weekly for wrap changes instead of having home health change them. She denies signs of infection. 8/4; patient presents for 1 week follow-up. She has tolerated the Kerlix/Coban wraps well. She no longer has weeping to her legs. She took the wrap off 1 day before coming in to be able to take a shower. She has no issues or complaints today. She denies signs of infection. 8/18; patient presents for follow-up. Patient has tolerated the wraps well. She brought her Velcro compression wraps today. She has no issues or complaints today. She had her chemotherapy infusion yesterday without issues. She denies signs of infection. 8/25; patient presents for follow-up. She used her juxta lite compressions for the past week. It is unclear if she is able to put these  on correctly since she states she has a hard time getting them to look right. She reports 2 open wounds. She currently denies  signs of infection. 9/1; patient presents for 1 week follow-up. She has 1 open wound. She tolerated the compression wraps well. She currently denies signs of infection. 9/8; patient presents for 1 week follow-up. She has 2 open wounds 1 on each leg. She has tolerated the compression wrap well. She currently denies signs of infection. 9/15; patient presents for 1 week follow-up. She now has 3 wounds. 2 on the left and 1 on the right. She continues to tolerate the compression wrap well. She currently denies signs of infection. She has obtained furosemide by her primary care physician and would like to discuss when to take this. 9/22; patient presents for 1 week follow-up. She has scattered wounds on her lower extremities bilaterally. She did take furosemide twice in the past week. She does not recall having to urinate more frequently. She tolerated the 3 layer compression well. She denies signs of infection. 10/13; patient has not been here recently because of [name] Apparently the facility was only putting gauze on her legs. This is a patient I do not normally see. 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 Ronnald Ramp at [facility] dermatology. She is quite convincing that she did not have radiation to her lower legs. It is likely she also has significant chronic venous insufficiency stasis dermatitis. We have been using silver alginate under kerlix Coban. She has obvious open areas medially on the right and areas on the left. She also has areas of extensive dry flaking adherent areas on the right and to a lesser extent on the left anterior. Nodular areas on the left lateral lower leg left posterior calf and right mid calf medially. 10/21; patient presents for follow-up. She has no issues or complaints  today. She has tolerated the 3 layer compression well bilaterally. She denies signs of infection. 10/27; patient presents for follow-up. She continues to tolerate the 3 layer compression wrap well. She denies signs of infection. 11/30; patient presents for follow-up. She has no issues or complaints today. 11/10; patient arrived in clinic today for nurse visit accompanied by her daughter from [place]. The daughter had multiple questions so we turned this into doctors visit. Apparently after the last time I saw this woman in October she went to see Dr. Ronnald Ramp but he did not take the wraps off. She previously was treated with Cemiplimab for her squamous cell carcinoma. She did not receive radiation to her legs. I had wanted Dr. Ronnald Ramp to look at this because of the exceptionally damaged skin on her lower legs bilaterally. She has odd looking wounds on the left medial lower leg also extending posteriorly which we have not made a lot of progress on. But she also has a raised hyperkeratotic nodules on the right anterior tibial area plaques on the left medial lower thigh. These are not areas that are under compression. We have been using silver alginate on any open areas Liberal TCA under 3 layer compression. 11/17; patient presents for follow-up. She had her wraps taken off yesterday for her dermatology appointment. She reports excessive weeping to her legs bilaterally after the wraps were taken off. She currently denies signs of infection. 12/12/2020 upon evaluation today patient appears to be doing about the same in regard to her wounds. She was actually started on Cipro after having biopsies apparently at her dermatology clinic she does not have the results back from the actual biopsy but the culture did return and  apparently they placed her on the Cipro she started that just this morning. Other than that her legs appear to be doing okay at this time. 12/1; patient presents for follow-up. She has no issues  or complaints today. She has started Lasix 40 mg daily to help with her bilateral leg swelling. 12/8; patient presents for follow-up. She has no issues or complaints today. 12/15; patient presents for 1 week follow-up. She has no issues or complaints today. 12/22; patient presents for follow-up. She has no issues or complaints today. 1/5; patient presents for follow-up. She has no issues or complaints today. She has tolerated the compression wraps well. 1/12; patient presents for follow-up. She is tolerated the compression wrap well and has no issues or complaints today. She denies signs of infection. 1/19; patient presents for follow-up. She took the compression wraps off 1 to 2 days ago to take a shower and did not have compression wraps replaced. She reports increased blistering throughout her legs bilaterally. She currently denies signs of infection. 1/26; patient presents for follow-up. She has no issues or complaints today. She tolerated the compression wraps well. She has not heard from oncology to schedule an appointment. 2/2; patient presents for follow-up. She has no issues or complaints today. She continues to tolerate the compression wrap well. Patient History Information obtained from Patient. Family History Unknown History. Social History Never smoker, Marital Status - Single, Alcohol Use - Never, Drug Use - No History, Caffeine Use - Never. Medical History Eyes Denies history of Cataracts, Optic Neuritis Ear/Nose/Mouth/Throat Denies history of Chronic sinus problems/congestion, Middle ear problems Hematologic/Lymphatic Patient has history of Anemia Denies history of Hemophilia, Human Immunodeficiency Virus, Lymphedema, Sickle Cell Disease Respiratory Denies history of Aspiration, Asthma, Chronic Obstructive Pulmonary Disease (COPD), Pneumothorax, Sleep Apnea, Tuberculosis Cardiovascular Patient has history of Arrhythmia - Atrial Flutter, A fibb, Congestive Heart Failure,  Hypertension Denies history of Angina, Coronary Artery Disease, Deep Vein Thrombosis, Hypotension, Myocardial Infarction, Peripheral Arterial Disease, Peripheral Venous Disease, Phlebitis, Vasculitis Gastrointestinal Patient has history of Colitis Denies history of Cirrhosis , Crohnoos, Hepatitis A, Hepatitis B, Hepatitis C Endocrine Denies history of Type I Diabetes, Type II Diabetes Genitourinary Denies history of End Stage Renal Disease Immunological Denies history of Lupus Erythematosus, Raynaudoos, Scleroderma Integumentary (Skin) Denies history of History of Burn Musculoskeletal Patient has history of Osteoarthritis Denies history of Gout, Rheumatoid Arthritis, Osteomyelitis Neurologic Denies history of Dementia, Neuropathy, Quadriplegia, Paraplegia, Seizure Disorder Oncologic Denies history of Received Chemotherapy, Received Radiation Hospitalization/Surgery History - removal of rod left leg. 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 Vitals Time Taken: 1:30 PM, Height: 65 in, Weight: 163 lbs, BMI: 27.1, Temperature: 98.4 F, Pulse: 84 bpm, Respiratory Rate: 20 breaths/min, Blood Pressure: 151/84 mmHg. Cardiovascular 2+ dorsalis pedis/posterior tibialis pulses. Psychiatric pleasant and cooperative. General Notes: Scattered wounds to her lower extremities bilaterally limited to skin breakdown. Areas appear well-healing. A suspicious lesion to the right anterior leg where She previously had squamous cell carcinoma. Integumentary (Hair, Skin) Wound #19 status is Open. Original cause of wound was Blister. The date acquired was: 02/06/2021. The wound has been in treatment 2 weeks. The wound is located on the Right,Circumferential Lower Leg. The wound  measures 4cm length x 15cm width x 0.1cm depth; 47.124cm^2 area and 4.712cm^3 volume. There is Fat Layer (Subcutaneous Tissue) exposed. There is no tunneling or undermining noted. There is a  medium amount of serosanguineous drainage noted. The wound margin is distinct with the outline attached to the wound base. There is large (67-100%) red granulation within the wound bed. There is no necrotic tissue within the wound bed. Wound #7R status is Open. Original cause of wound was Blister. The date acquired was: 04/20/2020. The wound has been in treatment 36 weeks. The wound is located on the Left,Circumferential Lower Leg. The wound measures 6cm length x 12cm width x 0.1cm depth; 56.549cm^2 area and 5.655cm^3 volume. There is Fat Layer (Subcutaneous Tissue) exposed. There is no tunneling or undermining noted. There is a medium amount of serosanguineous drainage noted. The wound margin is distinct with the outline attached to the wound base. There is large (67-100%) red, pink granulation within the wound bed. There is no necrotic tissue within the wound bed. Assessment Active Problems ICD-10 Squamous cell carcinoma of skin, unspecified Non-pressure chronic ulcer of other part of right lower leg with fat layer exposed Non-pressure chronic ulcer of other part of left lower leg with fat layer exposed Venous insufficiency (chronic) (peripheral) Lymphedema, not elsewhere classified Patient's wounds have shown improvement in size in appearance since last clinic visit. I recommended continue with silver alginate under compression therapy. We will add gentamicin to the open areas to address any bioburden. Overall wounds appear well-healing and I am hopeful that these will be closed in the next 1 to 2 weeks. She still has a suspicious lesion and states she has not been able to get in touch with the cancer center. We will resend orders however we have asked that she call to make an appointment. Procedures Wound  #19 Pre-procedure diagnosis of Wound #19 is a Venous Leg Ulcer located on the Right,Circumferential Lower Leg . There was a Three Layer Compression Therapy Procedure by Deon Pilling, RN. Post procedure Diagnosis Wound #19: Same as Pre-Procedure Wound #7R Pre-procedure diagnosis of Wound #7R is a Malignant Wound located on the Left,Circumferential Lower Leg . There was a Three Layer Compression Therapy Procedure by Deon Pilling, RN. Post procedure Diagnosis Wound #7R: Same as Pre-Procedure Plan Follow-up Appointments: Return Appointment in 2 weeks. - Dr. Heber Kildare and [name], RN Nurse Visit: - next week Other: - Patient to call [place] at [facility] [phone]. Bathing/ Shower/ Hygiene: May shower with protection but do not get wound dressing(s) wet. - Use cast protector Edema Control - Lymphedema / SCD / Other: Elevate legs to the level of the heart or above for 30 minutes daily and/or when sitting, a frequency of: - elevate the legs throughout the day heart level if possible. Avoid standing for long periods of time. Exercise regularly Additional Orders / Instructions: Follow Nutritious Diet WOUND #19: - Lower Leg Wound Laterality: Right, Circumferential Cleanser: Soap and Water 1 x Per Week/15 Days Discharge Instructions: May shower and wash wound with dial antibacterial soap and water prior to dressing change. Cleanser: Wound Cleanser 1 x Per Week/15 Days Discharge Instructions: Cleanse the wound with wound cleanser prior to applying a clean dressing using gauze sponges, not tissue or cotton balls. Peri-Wound Care: Triamcinolone 15 (g) 1 x Per Week/15 Days Discharge Instructions: In clinic only. Use triamcinolone 15 (g) in clinic only. Peri-Wound Care: Sween Lotion (Moisturizing lotion) 1 x Per Week/15 Days Discharge Instructions: Apply moisturizing lotion as directed Topical: Gentamicin 1 x Per Week/15 Days Discharge Instructions: As directed by physician Prim Dressing:  KerraCel Ag Gelling Fiber Dressing, 4x5 in (silver alginate) 1 x Per Week/15 Days ary Discharge Instructions: Apply  silver alginate to wound bed as instructed Secondary Dressing: Woven Gauze Sponge, Non-Sterile 4x4 in 1 x Per Week/15 Days Discharge Instructions: Apply over primary dressing as directed. Secondary Dressing: ABD Pad, 8x10 1 x Per Week/15 Days Discharge Instructions: Apply over primary dressing as directed. Com pression Wrap: ThreePress (3 layer compression wrap) 1 x Per Week/15 Days Discharge Instructions: Apply three layer compression as directed. WOUND #7R: - Lower Leg Wound Laterality: Left, Circumferential Cleanser: Soap and Water 1 x Per Week/15 Days Discharge Instructions: May shower and wash wound with dial antibacterial soap and water prior to dressing change. Cleanser: Wound Cleanser 1 x Per Week/15 Days Discharge Instructions: Cleanse the wound with wound cleanser prior to applying a clean dressing using gauze sponges, not tissue or cotton balls. Peri-Wound Care: Triamcinolone 15 (g) 1 x Per Week/15 Days Discharge Instructions: In clinic only. Use triamcinolone 15 (g) in clinic only. Peri-Wound Care: Sween Lotion (Moisturizing lotion) 1 x Per Week/15 Days Discharge Instructions: Apply moisturizing lotion as directed Topical: Gentamicin 1 x Per Week/15 Days Discharge Instructions: As directed by physician Prim Dressing: KerraCel Ag Gelling Fiber Dressing, 4x5 in (silver alginate) 1 x Per Week/15 Days ary Discharge Instructions: Apply silver alginate to wound bed as instructed Secondary Dressing: Woven Gauze Sponge, Non-Sterile 4x4 in 1 x Per Week/15 Days Discharge Instructions: Apply over primary dressing as directed. Secondary Dressing: ABD Pad, 8x10 1 x Per Week/15 Days Discharge Instructions: Apply over primary dressing as directed. Com pression Wrap: ThreePress (3 layer compression wrap) 1 x Per Week/15 Days Discharge Instructions: Apply three layer compression  as directed. 1. Silver alginate, gentamicin under 3 layer compression 2. Follow-up in 1 week Electronic Signature(s) Signed: 02/21/2021 2:33:37 PM By: Kalman Shan DO Entered By: Kalman Shan on 02/21/2021 14:32:28 -------------------------------------------------------------------------------- HxROS Details Patient Name: Date of Service: [name], [place]. 02/21/2021 1:00 PM Medical Record Number: [id number] Patient Account Number: [id number] Date of Birth/Sex: Treating RN: January 25, 1925 (88 y.o. [name] Primary Care Provider: [name] Other Clinician: Referring Provider: Treating Provider/Extender: [name] in Treatment: 26 Information Obtained From Patient Eyes Medical History: Negative for: Cataracts; Optic Neuritis Ear/Nose/Mouth/Throat Medical History: Negative for: Chronic sinus problems/congestion; Middle ear problems Hematologic/Lymphatic Medical History: Positive for: Anemia Negative for: Hemophilia; Human Immunodeficiency Virus; Lymphedema; Sickle Cell Disease Respiratory Medical History: Negative for: Aspiration; Asthma; Chronic Obstructive Pulmonary Disease (COPD); Pneumothorax; Sleep Apnea; Tuberculosis Cardiovascular Medical History: Positive for: Arrhythmia - Atrial Flutter, A fibb; Congestive Heart Failure; Hypertension Negative for: Angina; Coronary Artery Disease; Deep Vein Thrombosis; Hypotension; Myocardial Infarction; Peripheral Arterial Disease; Peripheral Venous Disease; Phlebitis; Vasculitis Past Medical History Notes: hyperlipidemia Gastrointestinal Medical History: Positive for: Colitis Negative for: Cirrhosis ; Crohns; Hepatitis A; Hepatitis B; Hepatitis C Endocrine Medical History: Negative for: Type I Diabetes; Type II Diabetes Past Medical History Notes: hypothyroidism Genitourinary Medical History: Negative for: End Stage Renal Disease Immunological Medical  History: Negative for: Lupus Erythematosus; Raynauds; Scleroderma Integumentary (Skin) Medical History: Negative for: History of Burn Musculoskeletal Medical History: Positive for: Osteoarthritis Negative for: Gout; Rheumatoid Arthritis; Osteomyelitis Neurologic Medical History: Negative for: Dementia; Neuropathy; Quadriplegia; Paraplegia; Seizure Disorder Past Medical History Notes: lumbar spindylolysis Oncologic Medical History: Negative for: Received Chemotherapy; Received Radiation Past Medical History Notes: BLE squamous ceel carcionoma Immunizations Pneumococcal Vaccine: Received Pneumococcal Vaccination: Yes Received Pneumococcal Vaccination On or After 60th Birthday: No Implantable Devices None Hospitalization / Surgery History Type of Hospitalization/Surgery removal of rod left leg Family and Social History Unknown History: Yes; Never smoker;  Marital Status - Single; Alcohol Use: Never; Drug Use: No History; Caffeine Use: Never; Financial Concerns: No; Food, Clothing or Shelter Needs: No; Support System Lacking: No; Transportation Concerns: No Electronic Signature(s) Signed: 02/21/2021 2:33:37 PM By: Kalman Shan DO Signed: 02/22/2021 1:32:11 PM By: Lorrin Jackson Entered By: Kalman Shan on 02/21/2021 14:30:45 -------------------------------------------------------------------------------- SuperBill Details Patient Name: Date of Service: [name], [place]. 02/21/2021 Medical Record Number: [id number] Patient Account Number: [id number] Date of Birth/Sex: Treating RN: [birth year] (88 y.o. [name], [name] Primary Care Provider: [name] Other Clinician: Referring Provider: Treating Provider/Extender: [name] in Treatment: 36 Diagnosis Coding ICD-10 Codes Code Description C44.92 Squamous cell carcinoma of skin, unspecified L97.812 Non-pressure chronic ulcer of other part of right lower leg with fat layer  exposed L97.822 Non-pressure chronic ulcer of other part of left lower leg with fat layer exposed I87.2 Venous insufficiency (chronic) (peripheral) I89.0 Lymphedema, not elsewhere classified Facility Procedures Physician Procedures : CPT4 Code Description Modifier [id number] - WC PHYS LEVEL 3 - EST PT ICD-10 Diagnosis Description U54.270 Non-pressure chronic ulcer of other part of right lower leg with fat layer exposed L97.822 Non-pressure chronic ulcer of other part of left  lower leg with fat layer exposed C44.92 Squamous cell carcinoma of skin, unspecified I87.2 Venous insufficiency (chronic) (peripheral) Quantity: 1 Electronic Signature(s) Signed: 02/21/2021 2:33:37 PM By: Kalman Shan DO Entered By: Kalman Shan on 02/21/2021 14:33:23

## 2021-02-25 ENCOUNTER — Other Ambulatory Visit: Payer: Self-pay | Admitting: Internal Medicine

## 2021-02-26 ENCOUNTER — Other Ambulatory Visit: Payer: Self-pay

## 2021-02-26 MED ORDER — POTASSIUM CHLORIDE CRYS ER 20 MEQ PO TBCR: 20.0000 meq | EXTENDED_RELEASE_TABLET | Freq: Every day | ORAL | 3 refills | Status: DC

## 2021-02-28 ENCOUNTER — Other Ambulatory Visit: Payer: Self-pay

## 2021-02-28 ENCOUNTER — Encounter (HOSPITAL_BASED_OUTPATIENT_CLINIC_OR_DEPARTMENT_OTHER): Payer: Medicare Other | Admitting: Internal Medicine

## 2021-02-28 DIAGNOSIS — L97822 Non-pressure chronic ulcer of other part of left lower leg with fat layer exposed: Secondary | ICD-10-CM | POA: Diagnosis not present

## 2021-02-28 DIAGNOSIS — I872 Venous insufficiency (chronic) (peripheral): Secondary | ICD-10-CM | POA: Diagnosis not present

## 2021-02-28 DIAGNOSIS — I890 Lymphedema, not elsewhere classified: Secondary | ICD-10-CM | POA: Diagnosis not present

## 2021-02-28 DIAGNOSIS — L97812 Non-pressure chronic ulcer of other part of right lower leg with fat layer exposed: Secondary | ICD-10-CM | POA: Diagnosis not present

## 2021-02-28 DIAGNOSIS — I110 Hypertensive heart disease with heart failure: Secondary | ICD-10-CM | POA: Diagnosis not present

## 2021-02-28 DIAGNOSIS — E039 Hypothyroidism, unspecified: Secondary | ICD-10-CM | POA: Diagnosis not present

## 2021-02-28 NOTE — Progress Notes (Signed)
[name] ([id number]) , Visit Report for 02/28/2021 Arrival Information Details Patient Name: Date of Service: [name], [name]. 02/28/2021 11:00 A M Medical Record Number: [id number] Patient Account Number: [id number] Date of Birth/Sex: Treating RN: 03-28-1925 (88 y.o. [name] Primary Care Tianah Lonardo: [name] Other Clinician: Referring Elmar Antigua: Treating Yari Szeliga/Extender: [name] in Treatment: 36 Visit Information History Since Last Visit Added or deleted any medications: No Patient Arrived: Walker Any new allergies or adverse reactions: No Arrival Time: 11:20 Had a fall or experienced change in No Transfer Assistance: None activities of daily living that may affect Patient Identification Verified: Yes risk of falls: Secondary Verification Process Completed: Yes Signs or symptoms of abuse/neglect since last visito No Patient Requires Transmission-Based No Hospitalized since last visit: No Precautions: Has Dressing in Place as Prescribed: Yes Patient Has Alerts: Yes Has Compression in Place as Prescribed: Yes Patient Alerts: R ABI = Non compressible Pain Present Now: No L ABI = Non compressible Electronic Signature(s) Signed: 02/28/2021 4:48:24 PM By: Sharyn Creamer RN, BSN Entered By: Sharyn Creamer on 02/28/2021 11:25:33 -------------------------------------------------------------------------------- Compression Therapy Details Patient Name: Date of Service: [name], [name] 02/28/2021 11:00 A M Medical Record Number: [id number] Patient Account Number: [id number] Date of Birth/Sex: Treating RN: 25-Dec-1925 (88 y.o. [name] Primary Care Novak Stgermaine: [name] Other Clinician: Referring Narya Beavin: Treating Lilja Soland/Extender: [name] in Treatment: 37 Compression Therapy Performed for Wound Assessment: Wound #19 Right,Circumferential Lower Leg Performed By:  Clinician Sharyn Creamer, RN Compression Type: Three Hydrologist) Signed: 02/28/2021 4:48:24 PM By: Sharyn Creamer RN, BSN Entered By: Sharyn Creamer on 02/28/2021 16:45:14 -------------------------------------------------------------------------------- Compression Therapy Details Patient Name: Date of Service: [name], [name] Bella. 02/28/2021 11:00 A M Medical Record Number: [id number] Patient Account Number: [id number] Date of Birth/Sex: Treating RN: 05/19/1925 (88 y.o. [name] Primary Care Fredricka Kohrs: [name] Other Clinician: Referring Ketara Cavness: Treating Zandria Woldt/Extender: [name] in Treatment: 37 Compression Therapy Performed for Wound Assessment: Wound #7R Left,Circumferential Lower Leg Performed By: Clinician Sharyn Creamer, RN Compression Type: Three Hydrologist) Signed: 02/28/2021 4:48:24 PM By: Sharyn Creamer RN, BSN Entered By: Sharyn Creamer on 02/28/2021 16:45:14 -------------------------------------------------------------------------------- Encounter Discharge Information Details Patient Name: Date of Service: [name], [name]. 02/28/2021 11:00 A M Medical Record Number: [id number] Patient Account Number: [id number] Date of Birth/Sex: Treating RN: 1926-01-15 (88 y.o. [name] Primary Care Arieanna Pressey: [name] Other Clinician: Referring Nathanyel Defenbaugh: Treating Quanell Loughney/Extender: [name] in Treatment: 42 Encounter Discharge Information Items Discharge Condition: Stable Ambulatory Status: Walker Discharge Destination: Home Transportation: Other Accompanied By: self Schedule Follow-up Appointment: Yes Clinical Summary of Care: Patient Declined Electronic Signature(s) Signed: 02/28/2021 4:48:24 PM By: Sharyn Creamer RN, BSN Entered By: Sharyn Creamer on 02/28/2021  16:46:56 -------------------------------------------------------------------------------- Patient/Caregiver Education Details Patient Name: Date of Service: [name], [name]. 2/9/2023andnbsp11:00 [place] Record Number: [id number] Patient Account Number: [id number] Date of Birth/Gender: Treating RN: 1925/12/28 (88 y.o. [name] Primary Care Physician: [name] Other Clinician: Referring Physician: Treating Physician/Extender: [name] in Treatment: 82 Education Assessment Education Provided To: Patient Education Topics Provided Wound/Skin Impairment: Methods: Explain/Verbal Responses: State content correctly [employer]) Signed: 02/28/2021 4:48:24 PM By: Sharyn Creamer RN, BSN Signed: 02/28/2021 4:48:24 PM By: Sharyn Creamer RN, BSN Entered By: Sharyn Creamer on 02/28/2021 16:46:20 -------------------------------------------------------------------------------- Wound Assessment Details Patient Name: Date of Service: [name],  [name] 02/28/2021 11:00 A M Medical Record Number: [id number] Patient Account Number: [id number] Date of Birth/Sex: Treating RN: Jan 25, 1925 (88 y.o. [name] Primary Care Dede Dobesh: [name] Other Clinician: Referring Liley Rake: Treating Shalom Mcguiness/Extender: [name] in Treatment: 37 Wound Status Wound Number: 19 Primary Etiology: Venous Leg Ulcer Wound Location: Right, Circumferential Lower Leg Wound Status: Open Wounding Event: Blister Date Acquired: 02/06/2021 Weeks Of Treatment: 3 Clustered Wound: Yes Wound Measurements Length: (cm) 4 Width: (cm) 15 Depth: (cm) 0.1 Area: (cm) 47.124 Volume: (cm) 4.712 % Reduction in Area: 90.7% % Reduction in Volume: 98.1% Wound Description Classification: Full Thickness Without Exposed Support Structu Exudate Amount: Medium Exudate Type: Serosanguineous Exudate Color: red,  brown res Treatment Notes Wound #19 (Lower Leg) Wound Laterality: Right, Circumferential Cleanser Soap and Water Discharge Instruction: May shower and wash wound with dial antibacterial soap and water prior to dressing change. Wound Cleanser Discharge Instruction: Cleanse the wound with wound cleanser prior to applying a clean dressing using gauze sponges, not tissue or cotton balls. Peri-Wound Care Triamcinolone 15 (g) Discharge Instruction: In clinic only. Use triamcinolone 15 (g) in clinic only. Sween Lotion (Moisturizing lotion) Discharge Instruction: Apply moisturizing lotion as directed Topical Gentamicin Discharge Instruction: As directed by physician Primary Dressing KerraCel Ag Gelling Fiber Dressing, 4x5 in (silver alginate) Discharge Instruction: Apply silver alginate to wound bed as instructed Secondary Dressing Woven Gauze Sponge, Non-Sterile 4x4 in Discharge Instruction: Apply over primary dressing as directed. ABD Pad, 8x10 Discharge Instruction: Apply over primary dressing as directed. Secured With Compression Wrap ThreePress (3 layer compression wrap) Discharge Instruction: Apply three layer compression as directed. Compression Stockings Add-Ons Electronic Signature(s) Signed: 02/28/2021 4:48:24 PM By: Sharyn Creamer RN, BSN Entered By: Sharyn Creamer on 02/28/2021 11:26:43 -------------------------------------------------------------------------------- Wound Assessment Details Patient Name: Date of Service: [name], [place]. 02/28/2021 11:00 A M Medical Record Number: [id number] Patient Account Number: [id number] Date of Birth/Sex: Treating RN: [birth year] (88 y.o. [name] Primary Care Hyun Marsalis: [name] Other Clinician: Referring Khala Tarte: Treating Korbin Notaro/Extender: [name] in Treatment: 37 Wound Status Wound Number: 7R Primary Etiology: Malignant Wound Wound Location: Left, Circumferential  Lower Leg Wound Status: Open Wounding Event: Blister Date Acquired: 04/20/2020 Weeks Of Treatment: 37 Clustered Wound: Yes Wound Measurements Length: (cm) 6 Width: (cm) 12 Depth: (cm) 0.1 Area: (cm) 56.549 Volume: (cm) 5.655 % Reduction in Area: -1741.4% % Reduction in Volume: -1742% Wound Description Classification: Full Thickness Without Exposed Support Structu Exudate Amount: Medium Exudate Type: Serosanguineous Exudate Color: red, brown res Treatment Notes Wound #7R (Lower Leg) Wound Laterality: Left, Circumferential Cleanser Soap and Water Discharge Instruction: May shower and wash wound with dial antibacterial soap and water prior to dressing change. Wound Cleanser Discharge Instruction: Cleanse the wound with wound cleanser prior to applying a clean dressing using gauze sponges, not tissue or cotton balls. Peri-Wound Care Triamcinolone 15 (g) Discharge Instruction: In clinic only. Use triamcinolone 15 (g) in clinic only. Sween Lotion (Moisturizing lotion) Discharge Instruction: Apply moisturizing lotion as directed Topical Gentamicin Discharge Instruction: As directed by physician Primary Dressing KerraCel Ag Gelling Fiber Dressing, 4x5 in (silver alginate) Discharge Instruction: Apply silver alginate to wound bed as instructed Secondary Dressing Woven Gauze Sponge, Non-Sterile 4x4 in Discharge Instruction: Apply over primary dressing as directed. ABD Pad, 8x10 Discharge Instruction: Apply over primary dressing as directed. Secured With Compression Wrap ThreePress (3 layer compression wrap) Discharge Instruction: Apply three layer compression as directed. Compression Stockings Add-Ons Electronic Signature(s)  Signed: 02/28/2021 4:48:24 PM By: Sharyn Creamer RN, BSN Entered By: Sharyn Creamer on 02/28/2021 11:26:43 -------------------------------------------------------------------------------- [place] Details Patient Name: Date of Service: [name], [place].  02/28/2021 11:00 A M Medical Record Number: [id number] Patient Account Number: [id number] Date of Birth/Sex: Treating RN: 23-Sep-1925 (88 y.o. [name] Primary Care Mohsin Crum: [name] Other Clinician: Referring Chalise Pe: Treating Inis Borneman/Extender: [name] in Treatment: 37 Vital Signs Time Taken: 11:25 Temperature (F): 98.1 Height (in): 65 Pulse (bpm): 69 Weight (lbs): 163 Respiratory Rate (breaths/min): 20 Body Mass Index (BMI): 27.1 Blood Pressure (mmHg): 107/62 Reference Range: 80 - 120 mg / dl Electronic Signature(s) Signed: 02/28/2021 4:48:24 PM By: Sharyn Creamer RN, BSN Entered By: Sharyn Creamer on 02/28/2021 11:26:05

## 2021-02-28 NOTE — Progress Notes (Signed)
[name] ([id number]) , Visit Report for 02/28/2021 SuperBill Details Patient Name: Date of Service: [name], [place]. 02/28/2021 Medical Record Number: [id number] Patient Account Number: [id number] Date of Birth/Sex: Treating RN: 1925-06-06 (88 y.o. [name] Primary Care Provider: [name] Other Clinician: Referring Provider: Treating Provider/Extender: [name] in Treatment: 37 Diagnosis Coding ICD-10 Codes Code Description C44.92 Squamous cell carcinoma of skin, unspecified L97.812 Non-pressure chronic ulcer of other part of right lower leg with fat layer exposed L97.822 Non-pressure chronic ulcer of other part of left lower leg with fat layer exposed I87.2 Venous insufficiency (chronic) (peripheral) I89.0 Lymphedema, not elsewhere classified Facility Procedures CPT4 Description Modifier Quantity Code [id number] BILATERAL: Application of multi-layer venous compression system; leg (below knee), including ankle and 1 foot. Electronic Signature(s) Signed: 02/28/2021 4:48:24 PM By: Sharyn Creamer RN, BSN Signed: 02/28/2021 4:58:33 PM By: Kalman Shan DO Entered By: Sharyn Creamer on 02/28/2021 16:47:27

## 2021-03-07 ENCOUNTER — Telehealth: Payer: Self-pay

## 2021-03-07 ENCOUNTER — Encounter (HOSPITAL_BASED_OUTPATIENT_CLINIC_OR_DEPARTMENT_OTHER): Payer: Medicare Other | Admitting: Internal Medicine

## 2021-03-07 ENCOUNTER — Other Ambulatory Visit: Payer: Self-pay

## 2021-03-07 DIAGNOSIS — L97822 Non-pressure chronic ulcer of other part of left lower leg with fat layer exposed: Secondary | ICD-10-CM | POA: Diagnosis not present

## 2021-03-07 DIAGNOSIS — L97812 Non-pressure chronic ulcer of other part of right lower leg with fat layer exposed: Secondary | ICD-10-CM

## 2021-03-07 DIAGNOSIS — I872 Venous insufficiency (chronic) (peripheral): Secondary | ICD-10-CM | POA: Diagnosis not present

## 2021-03-07 DIAGNOSIS — I890 Lymphedema, not elsewhere classified: Secondary | ICD-10-CM

## 2021-03-07 DIAGNOSIS — C4492 Squamous cell carcinoma of skin, unspecified: Secondary | ICD-10-CM | POA: Diagnosis not present

## 2021-03-07 DIAGNOSIS — E039 Hypothyroidism, unspecified: Secondary | ICD-10-CM | POA: Diagnosis not present

## 2021-03-07 DIAGNOSIS — I110 Hypertensive heart disease with heart failure: Secondary | ICD-10-CM | POA: Diagnosis not present

## 2021-03-07 NOTE — Progress Notes (Signed)
[name] ([id number]) , Visit Report for 03/07/2021 Chief Complaint Document Details Patient Name: Date of Service: [name], [place]. 03/07/2021 1:00 PM Medical Record Number: [id number] Patient Account Number: [id number] Date of Birth/Sex: Treating RN: 1925/05/13 (88 y.o. [name] Primary Care Provider: [name] Other Clinician: Referring Provider: Treating Provider/Extender: [name] in Treatment: 27 Information Obtained from: Patient Chief Complaint Bilateral lower extremity wounds that have been biopsied and positive for squamous cell carcinoma Electronic Signature(s) Signed: 03/07/2021 2:13:23 PM By: Kalman Shan DO Entered By: Kalman Shan on 03/07/2021 14:08:29 -------------------------------------------------------------------------------- HPI Details Patient Name: Date of Service: [name], [place]. 03/07/2021 1:00 PM Medical Record Number: [id number] Patient Account Number: [id number] Date of Birth/Sex: Treating RN: 1925/03/16 (88 y.o. [name] Primary Care Provider: [name] Other Clinician: Referring Provider: Treating Provider/Extender: [name] in Treatment: 62 History of Present Illness Location: left leg HPI Description: Admission 5/23 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 She states that the wound on the right has been present for a year. The 1 on the left has opened up 1 month ago. She is followed with oncology for this issue as she had biopsies that showed squamous cell carcinoma. She is also seeing radiation oncology for treatment options. She presents today because she would like for her wounds to be healed  by [place]. She currently denies signs of infection. 6/1; patient presents for 1 week follow-up. She states she has tolerated the leg wraps well. She states these do not bother her and is happy to continue with them. She is scheduled to see her oncologist today to go over treatment options for the bilateral lower extremity squamous cell carcinoma. Radiation is currently not a recommended option. Patient states she overall feels well. 6/22; patient presents for 3-week follow-up. She has tolerated the wraps well until her last wrap where she states they were uncomfortable. She attributes this to the home health nurse. She denies signs of infection. She has started her first treatment of antibody infusions for her Bilateral lower extremity squamous cell carcinoma. She has no complaints today. 7/21; patient presents for 1 month follow-up. Unfortunately she has not had good experience with her wrap changes with home health. She would like to do her own dressing changes. She continues to do her antibody infusions. She denies signs of infection. 7/28; patient presents for 1 week follow-up. At last clinic visit she was switched to daily dressing changes due to issues with the wrap and home health placing them. Unfortunately she has developed weeping to her legs bilaterally. She would like to be placed in wraps today. She would also like to follow with [place] weekly for wrap changes instead of having home health change them. She denies signs of infection. 8/4; patient presents for 1 week follow-up. She has tolerated the Kerlix/Coban wraps well. She no longer has weeping to her legs. She took the wrap off 1 day before coming in to be able to take a shower. She has no issues or complaints today. She denies signs of infection. 8/18; patient presents for follow-up. Patient has tolerated the wraps well. She brought her Velcro compression wraps today. She has no issues or complaints today. She had her chemotherapy infusion  yesterday without issues. She denies signs of infection. 8/25; patient presents for follow-up. She used her juxta lite compressions for the past week. It is unclear if she is able to put these on correctly since she states she has a hard time getting them to look right. She reports 2 open wounds. She currently denies signs of infection. 9/1; patient presents for 1 week follow-up. She has 1 open wound. She tolerated the compression wraps well. She currently denies signs of infection. 9/8; patient presents for 1 week follow-up. She has 2 open wounds 1 on each leg. She has tolerated the compression wrap well. She currently denies signs of infection. 9/15; patient presents for 1 week follow-up. She now has 3 wounds. 2 on the left and 1 on the right. She continues to tolerate the compression wrap well. She currently denies signs of infection. She has obtained furosemide by her primary care physician and would like to discuss when to take this. 9/22; patient presents for 1 week follow-up. She has scattered wounds on her lower extremities bilaterally. She did take furosemide twice in the past week. She does not recall having to urinate more frequently. She tolerated the 3 layer compression well. She denies signs of infection. 10/13; patient has not been here recently because of [place]. Apparently the facility was only putting gauze on her legs. This is a patient I do not normally see. 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 Ronnald Ramp at [facility] dermatology. She is quite convincing that she did not have radiation to her lower legs. It is likely she also has significant chronic venous insufficiency stasis dermatitis. We have been using silver alginate under kerlix Coban. She has obvious open areas medially on the right and areas on the left. She also has areas of extensive dry flaking adherent areas on the right and to a lesser  extent on the left anterior. Nodular areas on the left lateral lower leg left posterior calf and right mid calf medially. 10/21; patient presents for follow-up. She has no issues or complaints today. She has tolerated the 3 layer compression well bilaterally. She denies signs of infection. 10/27; patient presents for follow-up. She continues to tolerate the 3 layer compression wrap well. She denies signs of infection. 11/30; patient presents for follow-up. She has no issues or complaints today. 11/10; patient arrived in clinic today for nurse visit accompanied by her daughter from [place]. The daughter had multiple questions so we turned this into doctors visit. Apparently after the last time I saw this woman in October she went to see Dr. Ronnald Ramp but he did not take the wraps off. She previously was treated with Cemiplimab for her squamous cell carcinoma. She did not receive radiation to her legs. I had wanted Dr. Ronnald Ramp to look at this because of the exceptionally damaged skin on her lower legs bilaterally. She has odd looking wounds on the left medial lower leg also extending posteriorly which we have not made a lot of progress on. But she also has a raised hyperkeratotic nodules on the right anterior tibial area plaques on the left medial lower thigh. These are not areas that are under compression. We have been using silver alginate on any open areas Liberal TCA under 3 layer compression. 11/17; patient presents for follow-up. She had her wraps taken off yesterday for her dermatology appointment. She reports excessive weeping to her legs bilaterally after the wraps were  taken off. She currently denies signs of infection. 12/12/2020 upon evaluation today patient appears to be doing about the same in regard to her wounds. She was actually started on Cipro after having biopsies apparently at her dermatology clinic she does not have the results back from the actual biopsy but the culture did return and  apparently they placed her on the Cipro she started that just this morning. Other than that her legs appear to be doing okay at this time. 12/1; patient presents for follow-up. She has no issues or complaints today. She has started Lasix 40 mg daily to help with her bilateral leg swelling. 12/8; patient presents for follow-up. She has no issues or complaints today. 12/15; patient presents for 1 week follow-up. She has no issues or complaints today. 12/22; patient presents for follow-up. She has no issues or complaints today. 1/5; patient presents for follow-up. She has no issues or complaints today. She has tolerated the compression wraps well. 1/12; patient presents for follow-up. She is tolerated the compression wrap well and has no issues or complaints today. She denies signs of infection. 1/19; patient presents for follow-up. She took the compression wraps off 1 to 2 days ago to take a shower and did not have compression wraps replaced. She reports increased blistering throughout her legs bilaterally. She currently denies signs of infection. 1/26; patient presents for follow-up. She has no issues or complaints today. She tolerated the compression wraps well. She has not heard from oncology to schedule an appointment. 2/2; patient presents for follow-up. She has no issues or complaints today. She continues to tolerate the compression wrap well. 2/16; patient presents for follow-up. She has no issues or complaints today. Electronic Signature(s) Signed: 03/07/2021 2:13:23 PM By: Kalman Shan DO Entered By: Kalman Shan on 03/07/2021 14:09:10 -------------------------------------------------------------------------------- Physical Exam Details Patient Name: Date of Service: [name], [name] 03/07/2021 1:00 PM Medical Record Number: [id number] Patient Account Number: [id number] Date of Birth/Sex: Treating RN: 1925/12/31 (88 y.o. [name] Primary Care Provider: [name]  Other Clinician: Referring Provider: Treating Provider/Extender: [name] in Treatment: 69 Constitutional respirations regular, non-labored and within target range for patient.. Cardiovascular 2+ dorsalis pedis/posterior tibialis pulses. Psychiatric pleasant and cooperative. Notes 2 wounds present one each to her lower extremities bilaterally. A suspicious lesion to the right anterior leg where she previously had squamous cell carcinoma. Electronic Signature(s) Signed: 03/07/2021 2:13:23 PM By: Kalman Shan DO Entered By: Kalman Shan on 03/07/2021 14:10:15 -------------------------------------------------------------------------------- Physician Orders Details Patient Name: Date of Service: [name], [place]. 03/07/2021 1:00 PM Medical Record Number: [id number] Patient Account Number: [id number] Date of Birth/Sex: Treating RN: Jan 02, 1926 (88 y.o. [name], [name] Primary Care Provider: [name] Other Clinician: Referring Provider: Treating Provider/Extender: [name] in Treatment: 66 Verbal / Phone Orders: No Diagnosis Coding ICD-10 Coding Code Description C44.92 Squamous cell carcinoma of skin, unspecified L97.812 Non-pressure chronic ulcer of other part of right lower leg with fat layer exposed L97.822 Non-pressure chronic ulcer of other part of left lower leg with fat layer exposed I87.2 Venous insufficiency (chronic) (peripheral) I89.0 Lymphedema, not elsewhere classified Follow-up Appointments ppointment in 1 week. - Dr. Heber Fraser Room 8 over flow first after lunch per Dr. Heber Damascus Return A Other: - ****Please bring in compression garments wraps next week.*** Bathing/ Shower/ Hygiene May shower with protection but do not get wound dressing(s) wet. - Use cast protector Edema Control - Lymphedema / SCD / Other Elevate  legs to the level of the heart or above for 30 minutes daily and/or  when sitting, a frequency of: - elevate the legs throughout the day heart level if possible. Avoid standing for long periods of time. Exercise regularly Additional Orders / Instructions Follow Nutritious Diet Wound Treatment Wound #19 - Lower Leg Wound Laterality: Right, Circumferential Cleanser: Soap and Water 1 x Per Week/15 Days Discharge Instructions: May shower and wash wound with dial antibacterial soap and water prior to dressing change. Cleanser: Wound Cleanser 1 x Per Week/15 Days Discharge Instructions: Cleanse the wound with wound cleanser prior to applying a clean dressing using gauze sponges, not tissue or cotton balls. Peri-Wound Care: Triamcinolone 15 (g) 1 x Per Week/15 Days Discharge Instructions: In clinic only. Use triamcinolone 15 (g) in clinic only. Peri-Wound Care: Sween Lotion (Moisturizing lotion) 1 x Per Week/15 Days Discharge Instructions: Apply moisturizing lotion as directed Topical: Gentamicin 1 x Per Week/15 Days Discharge Instructions: As directed by physician Prim Dressing: KerraCel Ag Gelling Fiber Dressing, 4x5 in (silver alginate) 1 x Per Week/15 Days ary Discharge Instructions: Apply silver alginate to wound bed as instructed Secondary Dressing: Woven Gauze Sponge, Non-Sterile 4x4 in 1 x Per Week/15 Days Discharge Instructions: Apply over primary dressing as directed. Secondary Dressing: ABD Pad, 8x10 1 x Per Week/15 Days Discharge Instructions: Apply over primary dressing as directed. Compression Wrap: ThreePress (3 layer compression wrap) 1 x Per Week/15 Days Discharge Instructions: Apply three layer compression as directed. Wound #7R - Lower Leg Wound Laterality: Left, Circumferential Cleanser: Soap and Water 1 x Per Week/15 Days Discharge Instructions: May shower and wash wound with dial antibacterial soap and water prior to dressing change. Cleanser: Wound Cleanser 1 x Per Week/15 Days Discharge Instructions: Cleanse the wound with wound  cleanser prior to applying a clean dressing using gauze sponges, not tissue or cotton balls. Peri-Wound Care: Triamcinolone 15 (g) 1 x Per Week/15 Days Discharge Instructions: In clinic only. Use triamcinolone 15 (g) in clinic only. Peri-Wound Care: Sween Lotion (Moisturizing lotion) 1 x Per Week/15 Days Discharge Instructions: Apply moisturizing lotion as directed Topical: Gentamicin 1 x Per Week/15 Days Discharge Instructions: As directed by physician Prim Dressing: KerraCel Ag Gelling Fiber Dressing, 4x5 in (silver alginate) 1 x Per Week/15 Days ary Discharge Instructions: Apply silver alginate to wound bed as instructed Secondary Dressing: Woven Gauze Sponge, Non-Sterile 4x4 in 1 x Per Week/15 Days Discharge Instructions: Apply over primary dressing as directed. Secondary Dressing: ABD Pad, 8x10 1 x Per Week/15 Days Discharge Instructions: Apply over primary dressing as directed. Compression Wrap: ThreePress (3 layer compression wrap) 1 x Per Week/15 Days Discharge Instructions: Apply three layer compression as directed. Electronic Signature(s) Signed: 03/07/2021 2:13:23 PM By: Kalman Shan DO Entered By: Kalman Shan on 03/07/2021 14:10:31 -------------------------------------------------------------------------------- Problem List Details Patient Name: Date of Service: [name], [place]. 03/07/2021 1:00 PM Medical Record Number: [id number] Patient Account Number: [id number] Date of Birth/Sex: Treating RN: [birth year] (88 y.o. [name], [name] Primary Care Provider: [name] Other Clinician: Referring Provider: Treating Provider/Extender: [name] in Treatment: 30 Active Problems ICD-10 Encounter Code Description Active Date MDM Diagnosis C44.92 Squamous cell carcinoma of skin, unspecified 06/11/2020 No Yes L97.812 Non-pressure chronic ulcer of other part of right lower leg with fat layer 06/11/2020 No Yes exposed L97.822  Non-pressure chronic ulcer of other part of left lower leg with fat layer exposed5/23/2022 No Yes I87.2 Venous insufficiency (chronic) (peripheral) 06/11/2020 No Yes I89.0 Lymphedema, not elsewhere classified  01/31/2021 No Yes Inactive Problems Resolved Problems Electronic Signature(s) Signed: 03/07/2021 2:13:23 PM By: Kalman Shan DO Entered By: Kalman Shan on 03/07/2021 14:07:34 -------------------------------------------------------------------------------- Progress Note Details Patient Name: Date of Service: [name], [name] 03/07/2021 1:00 PM Medical Record Number: [id number] Patient Account Number: [id number] Date of Birth/Sex: Treating RN: 04-24-1925 (88 y.o. [name] Primary Care Provider: [name] Other Clinician: Referring Provider: Treating Provider/Extender: [name] in Treatment: 22 Subjective Chief Complaint Information obtained from Patient Bilateral lower extremity wounds that have been biopsied and positive for squamous cell carcinoma History of Present Illness (HPI) The following HPI elements were documented for the patient's wound: Location: left leg Admission 5/23 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 She states that the wound on the right has been present for a year. The 1 on the left has opened up 1 month ago. She is followed with oncology for this issue as she had biopsies that showed squamous cell carcinoma. She is also seeing radiation oncology for treatment options. She presents today because she would like for her wounds to be healed by [place]. She currently denies signs of infection. 6/1; patient presents for 1 week follow-up. She states she has tolerated the leg  wraps well. She states these do not bother her and is happy to continue with them. She is scheduled to see her oncologist today to go over treatment options for the bilateral lower extremity squamous cell carcinoma. Radiation is currently not a recommended option. Patient states she overall feels well. 6/22; patient presents for 3-week follow-up. She has tolerated the wraps well until her last wrap where she states they were uncomfortable. She attributes this to the home health nurse. She denies signs of infection. She has started her first treatment of antibody infusions for her Bilateral lower extremity squamous cell carcinoma. She has no complaints today. 7/21; patient presents for 1 month follow-up. Unfortunately she has not had good experience with her wrap changes with home health. She would like to do her own dressing changes. She continues to do her antibody infusions. She denies signs of infection. 7/28; patient presents for 1 week follow-up. At last clinic visit she was switched to daily dressing changes due to issues with the wrap and home health placing them. Unfortunately she has developed weeping to her legs bilaterally. She would like to be placed in wraps today. She would also like to follow with [place] weekly for wrap changes instead of having home health change them. She denies signs of infection. 8/4; patient presents for 1 week follow-up. She has tolerated the Kerlix/Coban wraps well. She no longer has weeping to her legs. She took the wrap off 1 day before coming in to be able to take a shower. She has no issues or complaints today. She denies signs of infection. 8/18; patient presents for follow-up. Patient has tolerated the wraps well. She brought her Velcro compression wraps today. She has no issues or complaints today. She had her chemotherapy infusion yesterday without issues. She denies signs of infection. 8/25; patient presents for follow-up. She used her juxta lite  compressions for the past week. It is unclear if she is able to put these on correctly since she states she has a hard time getting them to look  right. She reports 2 open wounds. She currently denies signs of infection. 9/1; patient presents for 1 week follow-up. She has 1 open wound. She tolerated the compression wraps well. She currently denies signs of infection. 9/8; patient presents for 1 week follow-up. She has 2 open wounds 1 on each leg. She has tolerated the compression wrap well. She currently denies signs of infection. 9/15; patient presents for 1 week follow-up. She now has 3 wounds. 2 on the left and 1 on the right. She continues to tolerate the compression wrap well. She currently denies signs of infection. She has obtained furosemide by her primary care physician and would like to discuss when to take this. 9/22; patient presents for 1 week follow-up. She has scattered wounds on her lower extremities bilaterally. She did take furosemide twice in the past week. She does not recall having to urinate more frequently. She tolerated the 3 layer compression well. She denies signs of infection. 10/13; patient has not been here recently because of [place]. Apparently the facility was only putting gauze on her legs. This is a patient I do not normally see. 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 Ronnald Ramp at [facility] dermatology. She is quite convincing that she did not have radiation to her lower legs. It is likely she also has significant chronic venous insufficiency stasis dermatitis. We have been using silver alginate under kerlix Coban. She has obvious open areas medially on the right and areas on the left. She also has areas of extensive dry flaking adherent areas on the right and to a lesser extent on the left anterior. Nodular areas on the left lateral lower leg left posterior calf and right mid  calf medially. 10/21; patient presents for follow-up. She has no issues or complaints today. She has tolerated the 3 layer compression well bilaterally. She denies signs of infection. 10/27; patient presents for follow-up. She continues to tolerate the 3 layer compression wrap well. She denies signs of infection. 11/30; patient presents for follow-up. She has no issues or complaints today. 11/10; patient arrived in clinic today for nurse visit accompanied by her daughter from [place]. The daughter had multiple questions so we turned this into doctors visit. Apparently after the last time I saw this woman in October she went to see Dr. Ronnald Ramp but he did not take the wraps off. She previously was treated with Cemiplimab for her squamous cell carcinoma. She did not receive radiation to her legs. I had wanted Dr. Ronnald Ramp to look at this because of the exceptionally damaged skin on her lower legs bilaterally. She has odd looking wounds on the left medial lower leg also extending posteriorly which we have not made a lot of progress on. But she also has a raised hyperkeratotic nodules on the right anterior tibial area plaques on the left medial lower thigh. These are not areas that are under compression. We have been using silver alginate on any open areas Liberal TCA under 3 layer compression. 11/17; patient presents for follow-up. She had her wraps taken off yesterday for her dermatology appointment. She reports excessive weeping to her legs bilaterally after the wraps were taken off. She currently denies signs of infection. 12/12/2020 upon evaluation today patient appears to be doing about the same in regard to her wounds. She was actually started on Cipro after having biopsies apparently at her dermatology clinic she does not have the results back from  the actual biopsy but the culture did return and apparently they placed her on the Cipro she started that just this morning. Other than that her legs appear  to be doing okay at this time. 12/1; patient presents for follow-up. She has no issues or complaints today. She has started Lasix 40 mg daily to help with her bilateral leg swelling. 12/8; patient presents for follow-up. She has no issues or complaints today. 12/15; patient presents for 1 week follow-up. She has no issues or complaints today. 12/22; patient presents for follow-up. She has no issues or complaints today. 1/5; patient presents for follow-up. She has no issues or complaints today. She has tolerated the compression wraps well. 1/12; patient presents for follow-up. She is tolerated the compression wrap well and has no issues or complaints today. She denies signs of infection. 1/19; patient presents for follow-up. She took the compression wraps off 1 to 2 days ago to take a shower and did not have compression wraps replaced. She reports increased blistering throughout her legs bilaterally. She currently denies signs of infection. 1/26; patient presents for follow-up. She has no issues or complaints today. She tolerated the compression wraps well. She has not heard from oncology to schedule an appointment. 2/2; patient presents for follow-up. She has no issues or complaints today. She continues to tolerate the compression wrap well. 2/16; patient presents for follow-up. She has no issues or complaints today. Patient History Information obtained from Patient. Family History Unknown History. Social History Never smoker, Marital Status - Single, Alcohol Use - Never, Drug Use - No History, Caffeine Use - Never. Medical History Eyes Denies history of Cataracts, Optic Neuritis Ear/Nose/Mouth/Throat Denies history of Chronic sinus problems/congestion, Middle ear problems Hematologic/Lymphatic Patient has history of Anemia Denies history of Hemophilia, Human Immunodeficiency Virus, Lymphedema, Sickle Cell Disease Respiratory Denies history of Aspiration, Asthma, Chronic Obstructive  Pulmonary Disease (COPD), Pneumothorax, Sleep Apnea, Tuberculosis Cardiovascular Patient has history of Arrhythmia - Atrial Flutter, A fibb, Congestive Heart Failure, Hypertension Denies history of Angina, Coronary Artery Disease, Deep Vein Thrombosis, Hypotension, Myocardial Infarction, Peripheral Arterial Disease, Peripheral Venous Disease, Phlebitis, Vasculitis Gastrointestinal Patient has history of Colitis Denies history of Cirrhosis , Crohnoos, Hepatitis A, Hepatitis B, Hepatitis C Endocrine Denies history of Type I Diabetes, Type II Diabetes Genitourinary Denies history of End Stage Renal Disease Immunological Denies history of Lupus Erythematosus, Raynaudoos, Scleroderma Integumentary (Skin) Denies history of History of Burn Musculoskeletal Patient has history of Osteoarthritis Denies history of Gout, Rheumatoid Arthritis, Osteomyelitis Neurologic Denies history of Dementia, Neuropathy, Quadriplegia, Paraplegia, Seizure Disorder Oncologic Denies history of Received Chemotherapy, Received Radiation Hospitalization/Surgery History - removal of rod left leg. 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 Vitals Time Taken: 1:15 PM, Height: 65 in, Weight: 163 lbs, BMI: 27.1, Temperature: 98 F, Pulse: 84 bpm, Respiratory Rate: 20 breaths/min, Blood Pressure: 131/75 mmHg. Cardiovascular 2+ dorsalis pedis/posterior tibialis pulses. Psychiatric pleasant and cooperative. General Notes: 2 wounds present one each to her lower extremities bilaterally. A suspicious lesion to the right anterior leg where she previously had squamous cell carcinoma. Integumentary (Hair, Skin) Wound #19 status is Open. Original cause of wound was Blister. The date acquired was:  02/06/2021. The wound has been in treatment 4 weeks. The wound is located on the Right,Circumferential Lower Leg. The wound measures 0.1cm length x 0.1cm width x 0.1cm depth; 0.008cm^2 area and 0.001cm^3 volume.  There is no tunneling or undermining noted. There is a none present amount of drainage noted. The wound margin is distinct with the outline attached to the wound base. There is no granulation within the wound bed. There is no necrotic tissue within the wound bed. Wound #7R status is Open. Original cause of wound was Blister. The date acquired was: 04/20/2020. The wound has been in treatment 38 weeks. The wound is located on the Left,Circumferential Lower Leg. The wound measures 0.5cm length x 0.5cm width x 0.1cm depth; 0.196cm^2 area and 0.02cm^3 volume. There is no tunneling or undermining noted. There is a none present amount of drainage noted. The wound margin is distinct with the outline attached to the wound base. There is no granulation within the wound bed. There is no necrotic tissue within the wound bed. Assessment Active Problems ICD-10 Squamous cell carcinoma of skin, unspecified Non-pressure chronic ulcer of other part of right lower leg with fat layer exposed Non-pressure chronic ulcer of other part of left lower leg with fat layer exposed Venous insufficiency (chronic) (peripheral) Lymphedema, not elsewhere classified Patient continues to do well with silver alginate under Kerlix/Coban. At this time she only has 2 small remaining wounds. We will continue with current therapy of silver alginate under Kerlix/Coban. I asked her to bring her juxta lite compression wraps to next clinic visit. I am hopeful she will be healed by then. Follow- up in 1 week. Procedures Wound #19 Pre-procedure diagnosis of Wound #19 is a Venous Leg Ulcer located on the Right,Circumferential Lower Leg . There was a Three Layer Compression Therapy Procedure by Deon Pilling, RN. Post procedure Diagnosis  Wound #19: Same as Pre-Procedure Wound #7R Pre-procedure diagnosis of Wound #7R is a Malignant Wound located on the Left,Circumferential Lower Leg . There was a Three Layer Compression Therapy Procedure by Deon Pilling, RN. Post procedure Diagnosis Wound #7R: Same as Pre-Procedure Plan Follow-up Appointments: Return Appointment in 1 week. - Dr. Heber Scarbro Room 8 over flow first after lunch per Dr. Heber Otway Other: - ****Please bring in compression garments wraps next week.*** Bathing/ Shower/ Hygiene: May shower with protection but do not get wound dressing(s) wet. - Use cast protector Edema Control - Lymphedema / SCD / Other: Elevate legs to the level of the heart or above for 30 minutes daily and/or when sitting, a frequency of: - elevate the legs throughout the day heart level if possible. Avoid standing for long periods of time. Exercise regularly Additional Orders / Instructions: Follow Nutritious Diet WOUND #19: - Lower Leg Wound Laterality: Right, Circumferential Cleanser: Soap and Water 1 x Per Week/15 Days Discharge Instructions: May shower and wash wound with dial antibacterial soap and water prior to dressing change. Cleanser: Wound Cleanser 1 x Per Week/15 Days Discharge Instructions: Cleanse the wound with wound cleanser prior to applying a clean dressing using gauze sponges, not tissue or cotton balls. Peri-Wound Care: Triamcinolone 15 (g) 1 x Per Week/15 Days Discharge Instructions: In clinic only. Use triamcinolone 15 (g) in clinic only. Peri-Wound Care: Sween Lotion (Moisturizing lotion) 1 x Per Week/15 Days Discharge Instructions: Apply moisturizing lotion as directed Topical: Gentamicin 1 x Per Week/15 Days Discharge Instructions: As directed by physician Prim Dressing: KerraCel Ag Gelling Fiber Dressing, 4x5 in (silver alginate) 1 x Per Week/15 Days ary Discharge Instructions: Apply silver alginate to wound bed as instructed Secondary Dressing: Woven Gauze Sponge,  Non-Sterile 4x4 in 1 x Per Week/15 Days Discharge Instructions: Apply over primary dressing as directed. Secondary Dressing: ABD Pad,  8x10 1 x Per Week/15 Days Discharge Instructions: Apply over primary dressing as directed. Com pression Wrap: ThreePress (3 layer compression wrap) 1 x Per Week/15 Days Discharge Instructions: Apply three layer compression as directed. WOUND #7R: - Lower Leg Wound Laterality: Left, Circumferential Cleanser: Soap and Water 1 x Per Week/15 Days Discharge Instructions: May shower and wash wound with dial antibacterial soap and water prior to dressing change. Cleanser: Wound Cleanser 1 x Per Week/15 Days Discharge Instructions: Cleanse the wound with wound cleanser prior to applying a clean dressing using gauze sponges, not tissue or cotton balls. Peri-Wound Care: Triamcinolone 15 (g) 1 x Per Week/15 Days Discharge Instructions: In clinic only. Use triamcinolone 15 (g) in clinic only. Peri-Wound Care: Sween Lotion (Moisturizing lotion) 1 x Per Week/15 Days Discharge Instructions: Apply moisturizing lotion as directed Topical: Gentamicin 1 x Per Week/15 Days Discharge Instructions: As directed by physician Prim Dressing: KerraCel Ag Gelling Fiber Dressing, 4x5 in (silver alginate) 1 x Per Week/15 Days ary Discharge Instructions: Apply silver alginate to wound bed as instructed Secondary Dressing: Woven Gauze Sponge, Non-Sterile 4x4 in 1 x Per Week/15 Days Discharge Instructions: Apply over primary dressing as directed. Secondary Dressing: ABD Pad, 8x10 1 x Per Week/15 Days Discharge Instructions: Apply over primary dressing as directed. Com pression Wrap: ThreePress (3 layer compression wrap) 1 x Per Week/15 Days Discharge Instructions: Apply three layer compression as directed. 1. Silver alginate under Kerlix/Coban 2. Follow-up in 1 week Electronic Signature(s) Signed: 03/07/2021 2:13:23 PM By: Kalman Shan DO Entered By: Kalman Shan on 03/07/2021  14:11:47 -------------------------------------------------------------------------------- HxROS Details Patient Name: Date of Service: [name], [name] 03/07/2021 1:00 PM Medical Record Number: [id number] Patient Account Number: [id number] Date of Birth/Sex: Treating RN: 04-30-1925 (88 y.o. [name] Primary Care Provider: [name] Other Clinician: Referring Provider: Treating Provider/Extender: [name] in Treatment: 43 Information Obtained From Patient Eyes Medical History: Negative for: Cataracts; Optic Neuritis Ear/Nose/Mouth/Throat Medical History: Negative for: Chronic sinus problems/congestion; Middle ear problems Hematologic/Lymphatic Medical History: Positive for: Anemia Negative for: Hemophilia; Human Immunodeficiency Virus; Lymphedema; Sickle Cell Disease Respiratory Medical History: Negative for: Aspiration; Asthma; Chronic Obstructive Pulmonary Disease (COPD); Pneumothorax; Sleep Apnea; Tuberculosis Cardiovascular Medical History: Positive for: Arrhythmia - Atrial Flutter, A fibb; Congestive Heart Failure; Hypertension Negative for: Angina; Coronary Artery Disease; Deep Vein Thrombosis; Hypotension; Myocardial Infarction; Peripheral Arterial Disease; Peripheral Venous Disease; Phlebitis; Vasculitis Past Medical History Notes: hyperlipidemia Gastrointestinal Medical History: Positive for: Colitis Negative for: Cirrhosis ; Crohns; Hepatitis A; Hepatitis B; Hepatitis C Endocrine Medical History: Negative for: Type I Diabetes; Type II Diabetes Past Medical History Notes: hypothyroidism Genitourinary Medical History: Negative for: End Stage Renal Disease Immunological Medical History: Negative for: Lupus Erythematosus; Raynauds; Scleroderma Integumentary (Skin) Medical History: Negative for: History of Burn Musculoskeletal Medical History: Positive for: Osteoarthritis Negative for: Gout; Rheumatoid  Arthritis; Osteomyelitis Neurologic Medical History: Negative for: Dementia; Neuropathy; Quadriplegia; Paraplegia; Seizure Disorder Past Medical History Notes: lumbar spindylolysis Oncologic Medical History: Negative for: Received Chemotherapy; Received Radiation Past Medical History Notes: BLE squamous ceel carcionoma Immunizations Pneumococcal Vaccine: Received Pneumococcal Vaccination: Yes Received Pneumococcal Vaccination On or After 60th Birthday: No Implantable Devices None Hospitalization / Surgery History Type of Hospitalization/Surgery removal of rod left leg Family and Social History Unknown History: Yes; Never smoker; Marital Status - Single; Alcohol Use: Never; Drug Use: No History; Caffeine Use: Never; Financial Concerns: No; Food, Clothing or Shelter Needs: No; Support System Lacking: No; Transportation Concerns: No Electronic Signature(s) Signed: 03/07/2021 2:13:23  PM By: Kalman Shan DO Signed: 03/07/2021 4:02:17 PM By: Deon Pilling RN, BSN Entered By: Kalman Shan on 03/07/2021 14:09:23 -------------------------------------------------------------------------------- SuperBill Details Patient Name: Date of Service: [name], [place]. 03/07/2021 Medical Record Number: [id number] Patient Account Number: [id number] Date of Birth/Sex: Treating RN: May 20, 1925 (88 y.o. [name], [name] Primary Care Provider: [name] Other Clinician: Referring Provider: Treating Provider/Extender: [name] in Treatment: 33 Diagnosis Coding ICD-10 Codes Code Description C44.92 Squamous cell carcinoma of skin, unspecified L97.812 Non-pressure chronic ulcer of other part of right lower leg with fat layer exposed L97.822 Non-pressure chronic ulcer of other part of left lower leg with fat layer exposed I87.2 Venous insufficiency (chronic) (peripheral) I89.0 Lymphedema, not elsewhere classified Facility Procedures Physician  Procedures : CPT4 Code Description Modifier [id number] - WC PHYS LEVEL 3 - EST PT ICD-10 Diagnosis Description W41.324 Non-pressure chronic ulcer of other part of right lower leg with fat layer exposed L97.822 Non-pressure chronic ulcer of other part of left  lower leg with fat layer exposed I89.0 Lymphedema, not elsewhere classified C44.92 Squamous cell carcinoma of skin, unspecified Quantity: 1 Electronic Signature(s) Signed: 03/07/2021 2:13:23 PM By: Kalman Shan DO Entered By: Kalman Shan on 03/07/2021 14:13:02

## 2021-03-07 NOTE — Telephone Encounter (Signed)
Patient's daughter Bernette Redbird [phone]) called from [place] with multiple concerns. Pam will not be able to travel from [facility] to [place] because of her recent hip surgery and is concerned that her mother will be at the cancer center by herself for New Patient Apt with Dr. Alen Blew on 03/15/21. She is requesting to be included in visit by phone.   Patient lives at [place] in independent living and will be transported to [facility] by facility staff. Patient has no social support in [place]. Patient's husband is being followed by Hospice.

## 2021-03-07 NOTE — Progress Notes (Signed)
[name] ([id number]) , Visit Report for 03/07/2021 Arrival Information Details Patient Name: Date of Service: [name], [name]. 03/07/2021 1:00 PM Medical Record Number: [id number] Patient Account Number: [id number] Date of Birth/Sex: Treating RN: 1925/04/17 (88 y.o. [name], [name] Primary Care Miamarie Moll: [name] Other Clinician: Referring Daphnee Preiss: Treating Catrina Fellenz/Extender: [name] in Treatment: 71 Visit Information History Since Last Visit Added or deleted any medications: No Patient Arrived: Walker Any new allergies or adverse reactions: No Arrival Time: 13:16 Had a fall or experienced change in No Accompanied By: self activities of daily living that may affect Transfer Assistance: None risk of falls: Patient Identification Verified: Yes Signs or symptoms of abuse/neglect since last visito No Secondary Verification Process Completed: Yes Hospitalized since last visit: No Patient Requires Transmission-Based No Implantable device outside of the clinic excluding No Precautions: cellular tissue based products placed in the center Patient Has Alerts: Yes since last visit: Patient Alerts: R ABI = Non compressible Has Dressing in Place as Prescribed: Yes L ABI = Non compressible Has Compression in Place as Prescribed: Yes Pain Present Now: No Electronic Signature(s) Signed: 03/07/2021 4:02:17 PM By: Deon Pilling RN, BSN Entered By: Deon Pilling on 03/07/2021 13:16:23 -------------------------------------------------------------------------------- Compression Therapy Details Patient Name: Date of Service: [name], [name] 03/07/2021 1:00 PM Medical Record Number: [id number] Patient Account Number: [id number] Date of Birth/Sex: Treating RN: 13-Oct-1925 (88 y.o. [name] Primary Care Prerana Strayer: [name] Other Clinician: Referring Savannah Erbe: Treating Rayquon Uselman/Extender: [name] in Treatment: 74 Compression Therapy Performed for Wound Assessment: Wound #19 Right,Circumferential Lower Leg Performed By: Clinician Deon Pilling, RN Compression Type: Three Layer Post Procedure Diagnosis Same as Pre-procedure Electronic Signature(s) Signed: 03/07/2021 4:02:17 PM By: Deon Pilling RN, BSN Entered By: Deon Pilling on 03/07/2021 13:48:25 -------------------------------------------------------------------------------- Compression Therapy Details Patient Name: Date of Service: [name], [name] 03/07/2021 1:00 PM Medical Record Number: [id number] Patient Account Number: [id number] Date of Birth/Sex: Treating RN: November 07, 1925 (87 y.o. [name] Primary Care Odilia Damico: [name] Other Clinician: Referring Pranathi Winfree: Treating Riki Gehring/Extender: [name] in Treatment: 22 Compression Therapy Performed for Wound Assessment: Wound #7R Left,Circumferential Lower Leg Performed By: Clinician Deon Pilling, RN Compression Type: Three Layer Post Procedure Diagnosis Same as Pre-procedure Electronic Signature(s) Signed: 03/07/2021 4:02:17 PM By: Deon Pilling RN, BSN Entered By: Deon Pilling on 03/07/2021 13:48:25 -------------------------------------------------------------------------------- Encounter Discharge Information Details Patient Name: Date of Service: [name], [name]. 03/07/2021 1:00 PM Medical Record Number: [id number] Patient Account Number: [id number] Date of Birth/Sex: Treating RN: 12/24/1925 (88 y.o. [name] Primary Care Juwann Sherk: [name] Other Clinician: Referring Disha Cottam: Treating Sitara Cashwell/Extender: [name] in Treatment: 33 Encounter Discharge Information Items Discharge Condition: Stable Ambulatory Status: Walker Discharge Destination: Home Transportation: Private Auto Accompanied By: self Schedule Follow-up Appointment:  Yes Clinical Summary of Care: Electronic Signature(s) Signed: 03/07/2021 4:02:17 PM By: Deon Pilling RN, BSN Entered By: Deon Pilling on 03/07/2021 13:52:13 -------------------------------------------------------------------------------- Lower Extremity Assessment Details Patient Name: Date of Service: [name], [name] 03/07/2021 1:00 PM Medical Record Number: [id number] Patient Account Number: [id number] Date of Birth/Sex: Treating RN: 10-04-1925 (88 y.o. [name] Primary Care Damein Gaunce: [name] Other Clinician: Referring Tykel Badie: Treating Hesper Venturella/Extender: [name] in Treatment: 38 Edema Assessment Assessed: [Left: Yes] [Right: Yes] Edema: [Left: Yes] [Right: Yes] Calf Left: Right: Point of Measurement: 30 cm From Medial Instep 31 cm  30 cm Ankle Left: Right: Point of Measurement: 9 cm From Medial Instep 22 cm 22 cm Vascular Assessment Pulses: Dorsalis Pedis Palpable: [Left:Yes] [Right:Yes] Electronic Signature(s) Signed: 03/07/2021 4:02:17 PM By: Deon Pilling RN, BSN Entered By: Deon Pilling on 03/07/2021 13:22:35 -------------------------------------------------------------------------------- Multi Wound Chart Details Patient Name: Date of Service: [name], [name] 03/07/2021 1:00 PM Medical Record Number: [id number] Patient Account Number: [id number] Date of Birth/Sex: Treating RN: [birth year] (88 y.o. [name], [name] Primary Care Captola Teschner: [name] Other Clinician: Referring Sidi Dzikowski: Treating Dauna Ziska/Extender: [name] in Treatment: 42 Vital Signs Height(in): 65 Pulse(bpm): 87 Weight(lbs): 163 Blood Pressure(mmHg): 131/75 Body Mass Index(BMI): 27.1 Temperature(F): 98 Respiratory Rate(breaths/min): 20 Photos: [N/A:N/A] Right, Circumferential Lower Leg Left, Circumferential Lower Leg N/A Wound Location: Blister Blister N/A Wounding Event: Venous  Leg Ulcer Malignant Wound N/A Primary Etiology: Anemia, Arrhythmia, Congestive Heart Anemia, Arrhythmia, Congestive Heart N/A Comorbid History: Failure, Hypertension, Colitis, Failure, Hypertension, Colitis, Osteoarthritis Osteoarthritis 02/06/2021 04/20/2020 N/A Date Acquired: 4 38 N/A Weeks of Treatment: Open Open N/A Wound Status: No Yes N/A Wound Recurrence: Yes Yes N/A Clustered Wound: 0.1x0.1x0.1 0.5x0.5x0.1 N/A Measurements L x W x D (cm) 0.008 0.196 N/A A (cm) : rea 0.001 0.02 N/A Volume (cm) : 100.00% 93.60% N/A % Reduction in Area: 100.00% 93.50% N/A % Reduction in Volume: Full Thickness Without Exposed Full Thickness Without Exposed N/A Classification: Support Structures Support Structures None Present None Present N/A Exudate Amount: Distinct, outline attached Distinct, outline attached N/A Wound Margin: None Present (0%) None Present (0%) N/A Granulation Amount: None Present (0%) None Present (0%) N/A Necrotic Amount: Fascia: No Fascia: No N/A Exposed Structures: Fat Layer (Subcutaneous Tissue): No Fat Layer (Subcutaneous Tissue): No Tendon: No Tendon: No Muscle: No Muscle: No Joint: No Joint: No Bone: No Bone: No Large (67-100%) Large (67-100%) N/A Epithelialization: Compression Therapy Compression Therapy N/A Procedures Performed: Treatment Notes Wound #19 (Lower Leg) Wound Laterality: Right, Circumferential Cleanser Soap and Water Discharge Instruction: May shower and wash wound with dial antibacterial soap and water prior to dressing change. Wound Cleanser Discharge Instruction: Cleanse the wound with wound cleanser prior to applying a clean dressing using gauze sponges, not tissue or cotton balls. Peri-Wound Care Triamcinolone 15 (g) Discharge Instruction: In clinic only. Use triamcinolone 15 (g) in clinic only. Sween Lotion (Moisturizing lotion) Discharge Instruction: Apply moisturizing lotion as  directed Topical Gentamicin Discharge Instruction: As directed by physician Primary Dressing KerraCel Ag Gelling Fiber Dressing, 4x5 in (silver alginate) Discharge Instruction: Apply silver alginate to wound bed as instructed Secondary Dressing Woven Gauze Sponge, Non-Sterile 4x4 in Discharge Instruction: Apply over primary dressing as directed. ABD Pad, 8x10 Discharge Instruction: Apply over primary dressing as directed. Secured With Compression Wrap ThreePress (3 layer compression wrap) Discharge Instruction: Apply three layer compression as directed. Compression Stockings Add-Ons Wound #7R (Lower Leg) Wound Laterality: Left, Circumferential Cleanser Soap and Water Discharge Instruction: May shower and wash wound with dial antibacterial soap and water prior to dressing change. Wound Cleanser Discharge Instruction: Cleanse the wound with wound cleanser prior to applying a clean dressing using gauze sponges, not tissue or cotton balls. Peri-Wound Care Triamcinolone 15 (g) Discharge Instruction: In clinic only. Use triamcinolone 15 (g) in clinic only. Sween Lotion (Moisturizing lotion) Discharge Instruction: Apply moisturizing lotion as directed Topical Gentamicin Discharge Instruction: As directed by physician Primary Dressing KerraCel Ag Gelling Fiber Dressing, 4x5 in (silver alginate) Discharge Instruction: Apply silver alginate to wound bed as instructed Secondary Dressing Woven Gauze Sponge, Non-Sterile 4x4  in Discharge Instruction: Apply over primary dressing as directed. ABD Pad, 8x10 Discharge Instruction: Apply over primary dressing as directed. Secured With Compression Wrap ThreePress (3 layer compression wrap) Discharge Instruction: Apply three layer compression as directed. Compression Stockings Add-Ons Electronic Signature(s) Signed: 03/07/2021 2:13:23 PM By: Kalman Shan DO Signed: 03/07/2021 4:02:17 PM By: Deon Pilling RN, BSN Entered By: Kalman Shan on 03/07/2021 14:08:03 -------------------------------------------------------------------------------- Multi-Disciplinary Care Plan Details Patient Name: Date of Service: [name], [name] 03/07/2021 1:00 PM Medical Record Number: [id number] Patient Account Number: [id number] Date of Birth/Sex: Treating RN: February 15, 1925 (88 y.o. [name] Primary Care Michial Disney: [name] Other Clinician: Referring Hutson Luft: Treating Maressa Apollo/Extender: [name] in Treatment: 5 Multidisciplinary Care Plan reviewed with physician Active Inactive Venous Leg Ulcer Nursing Diagnoses: Knowledge deficit related to disease process and management Potential for venous Insuffiency (use before diagnosis confirmed) Goals: Patient will maintain optimal edema control Date Initiated: 11/01/2020 Target Resolution Date: 03/22/2021 Goal Status: Active Interventions: Assess peripheral edema status every visit. Compression as ordered Provide education on venous insufficiency Treatment Activities: Therapeutic compression applied : 11/01/2020 Notes: 12/27/20: Edema control ongoing 01/24/21: Edema control continues, using 3 layer compression Wound/Skin Impairment Nursing Diagnoses: Impaired tissue integrity Knowledge deficit related to ulceration/compromised skin integrity Goals: Patient/caregiver will verbalize understanding of skin care regimen Date Initiated: 06/11/2020 Target Resolution Date: 03/22/2021 Goal Status: Active Interventions: Assess patient/caregiver ability to obtain necessary supplies Assess patient/caregiver ability to perform ulcer/skin care regimen upon admission and as needed Assess ulceration(s) every visit Provide education on ulcer and skin care Notes: 10/11/20: Wound care regimen ongoing. 12/27/20: Wound care continues Electronic Signature(s) Signed: 03/07/2021 4:02:17 PM By: Deon Pilling RN, BSN Entered By: Deon Pilling on  03/07/2021 13:26:01 -------------------------------------------------------------------------------- Pain Assessment Details Patient Name: Date of Service: [name], [name] 03/07/2021 1:00 PM Medical Record Number: [id number] Patient Account Number: [id number] Date of Birth/Sex: Treating RN: 1925-06-03 (88 y.o. [name] Primary Care Satchel Heidinger: [name] Other Clinician: Referring Davien Malone: Treating Daelon Dunivan/Extender: [name] in Treatment: 70 Active Problems Location of Pain Severity and Description of Pain Patient Has Paino No Site Locations Rate the pain. Current Pain Level: 0 Pain Management and Medication Current Pain Management: Medication: No Cold Application: No Rest: No Massage: No Activity: No T.E.N.S.: No Heat Application: No Leg drop or elevation: No Is the Current Pain Management Adequate: Adequate How does your wound impact your activities of daily livingo Sleep: No Bathing: No Appetite: No Relationship With Others: No Bladder Continence: No Emotions: No Bowel Continence: No Work: No Toileting: No Drive: No Dressing: No Hobbies: No Engineer, maintenance) Signed: 03/07/2021 4:02:17 PM By: Deon Pilling RN, BSN Signed: 03/07/2021 4:02:17 PM By: Deon Pilling RN, BSN Entered By: Deon Pilling on 03/07/2021 13:16:51 -------------------------------------------------------------------------------- Patient/Caregiver Education Details Patient Name: Date of Service: [name], [name] 2/16/2023andnbsp1:00 PM Medical Record Number: [id number] Patient Account Number: [id number] Date of Birth/Gender: Treating RN: 22-Jan-1925 (88 y.o. [name] Primary Care Physician: [name] Other Clinician: Referring Physician: Treating Physician/Extender: [name] in Treatment: 44 Education Assessment Education Provided To: Patient Education Topics  Provided Wound/Skin Impairment: Handouts: Skin Care Do's and Dont's Methods: Explain/Verbal Responses: Reinforcements needed Electronic Signature(s) Signed: 03/07/2021 4:02:17 PM By: Deon Pilling RN, BSN Entered By: Deon Pilling on 03/07/2021 13:29:15 -------------------------------------------------------------------------------- Wound Assessment Details Patient Name: Date of Service: [name], [name] 03/07/2021 1:00 PM Medical Record Number: [id number] Patient Account Number: [id number] Date of  Birth/Sex: Treating RN: 10-Mar-1925 (88 y.o. [name], [name] Primary Care Joyel Chenette: [name] Other Clinician: Referring Laura Caldas: Treating Alejo Beamer/Extender: [name] in Treatment: 87 Wound Status Wound Number: 19 Primary Venous Leg Ulcer Etiology: Wound Location: Right, Circumferential Lower Leg Wound Open Wounding Event: Blister Status: Date Acquired: 02/06/2021 Comorbid Anemia, Arrhythmia, Congestive Heart Failure, Hypertension, Weeks Of Treatment: 4 History: Colitis, Osteoarthritis Clustered Wound: Yes Photos Wound Measurements Length: (cm) 0.1 Width: (cm) 0.1 Depth: (cm) 0.1 Area: (cm) 0.008 Volume: (cm) 0.001 % Reduction in Area: 100% % Reduction in Volume: 100% Epithelialization: Large (67-100%) Tunneling: No Undermining: No Wound Description Classification: Full Thickness Without Exposed Support Structures Wound Margin: Distinct, outline attached Exudate Amount: None Present Foul Odor After Cleansing: No Slough/Fibrino No Wound Bed Granulation Amount: None Present (0%) Exposed Structure Necrotic Amount: None Present (0%) Fascia Exposed: No Fat Layer (Subcutaneous Tissue) Exposed: No Tendon Exposed: No Muscle Exposed: No Joint Exposed: No Bone Exposed: No Treatment Notes Wound #19 (Lower Leg) Wound Laterality: Right, Circumferential Cleanser Soap and Water Discharge Instruction: May shower and wash wound  with dial antibacterial soap and water prior to dressing change. Wound Cleanser Discharge Instruction: Cleanse the wound with wound cleanser prior to applying a clean dressing using gauze sponges, not tissue or cotton balls. Peri-Wound Care Triamcinolone 15 (g) Discharge Instruction: In clinic only. Use triamcinolone 15 (g) in clinic only. Sween Lotion (Moisturizing lotion) Discharge Instruction: Apply moisturizing lotion as directed Topical Gentamicin Discharge Instruction: As directed by physician Primary Dressing KerraCel Ag Gelling Fiber Dressing, 4x5 in (silver alginate) Discharge Instruction: Apply silver alginate to wound bed as instructed Secondary Dressing Woven Gauze Sponge, Non-Sterile 4x4 in Discharge Instruction: Apply over primary dressing as directed. ABD Pad, 8x10 Discharge Instruction: Apply over primary dressing as directed. Secured With Compression Wrap ThreePress (3 layer compression wrap) Discharge Instruction: Apply three layer compression as directed. Compression Stockings Add-Ons Electronic Signature(s) Signed: 03/07/2021 4:02:17 PM By: Deon Pilling RN, BSN Entered By: Deon Pilling on 03/07/2021 13:24:39 -------------------------------------------------------------------------------- Wound Assessment Details Patient Name: Date of Service: [name], [name] 03/07/2021 1:00 PM Medical Record Number: [id number] Patient Account Number: [id number] Date of Birth/Sex: Treating RN: July 28, 1925 (88 y.o. [name], [name] Primary Care Prajwal Fellner: [name] Other Clinician: Referring Kaniah Rizzolo: Treating Kynadie Yaun/Extender: [name] in Treatment: 15 Wound Status Wound Number: 7R Primary Malignant Wound Etiology: Wound Location: Left, Circumferential Lower Leg Wound Open Wounding Event: Blister Status: Date Acquired: 04/20/2020 Comorbid Anemia, Arrhythmia, Congestive Heart Failure, Hypertension, Weeks Of Treatment:  38 History: Colitis, Osteoarthritis Clustered Wound: Yes Photos Wound Measurements Length: (cm) 0.5 Width: (cm) 0.5 Depth: (cm) 0.1 Area: (cm) 0.196 Volume: (cm) 0.02 % Reduction in Area: 93.6% % Reduction in Volume: 93.5% Epithelialization: Large (67-100%) Tunneling: No Undermining: No Wound Description Classification: Full Thickness Without Exposed Support Structures Wound Margin: Distinct, outline attached Exudate Amount: None Present Foul Odor After Cleansing: No Slough/Fibrino No Wound Bed Granulation Amount: None Present (0%) Exposed Structure Necrotic Amount: None Present (0%) Fascia Exposed: No Fat Layer (Subcutaneous Tissue) Exposed: No Tendon Exposed: No Muscle Exposed: No Joint Exposed: No Bone Exposed: No Treatment Notes Wound #7R (Lower Leg) Wound Laterality: Left, Circumferential Cleanser Soap and Water Discharge Instruction: May shower and wash wound with dial antibacterial soap and water prior to dressing change. Wound Cleanser Discharge Instruction: Cleanse the wound with wound cleanser prior to applying a clean dressing using gauze sponges, not tissue or cotton balls. Peri-Wound Care Triamcinolone 15 (g) Discharge Instruction:  In clinic only. Use triamcinolone 15 (g) in clinic only. Sween Lotion (Moisturizing lotion) Discharge Instruction: Apply moisturizing lotion as directed Topical Gentamicin Discharge Instruction: As directed by physician Primary Dressing KerraCel Ag Gelling Fiber Dressing, 4x5 in (silver alginate) Discharge Instruction: Apply silver alginate to wound bed as instructed Secondary Dressing Woven Gauze Sponge, Non-Sterile 4x4 in Discharge Instruction: Apply over primary dressing as directed. ABD Pad, 8x10 Discharge Instruction: Apply over primary dressing as directed. Secured With Compression Wrap ThreePress (3 layer compression wrap) Discharge Instruction: Apply three layer compression as directed. Compression  Stockings Add-Ons Electronic Signature(s) Signed: 03/07/2021 4:02:17 PM By: Deon Pilling RN, BSN Entered By: Deon Pilling on 03/07/2021 13:25:27 -------------------------------------------------------------------------------- Vitals Details Patient Name: Date of Service: [name], [name] 03/07/2021 1:00 PM Medical Record Number: [id number] Patient Account Number: [id number] Date of Birth/Sex: Treating RN: 12-19-1925 (88 y.o. [name], [name] Primary Care Exa Bomba: [name] Other Clinician: Referring Georgi Tuel: Treating Alera Quevedo/Extender: [name] in Treatment: 16 Vital Signs Time Taken: 13:15 Temperature (F): 98 Height (in): 65 Pulse (bpm): 84 Weight (lbs): 163 Respiratory Rate (breaths/min): 20 Body Mass Index (BMI): 27.1 Blood Pressure (mmHg): 131/75 Reference Range: 80 - 120 mg / dl Electronic Signature(s) Signed: 03/07/2021 4:02:17 PM By: Deon Pilling RN, BSN Entered By: Deon Pilling on 03/07/2021 13:16:36

## 2021-03-12 ENCOUNTER — Telehealth: Payer: Self-pay | Admitting: *Deleted

## 2021-03-12 NOTE — Telephone Encounter (Signed)
PC to patient's daughter, [name], informed her she can be included by phone during her mother's MD appointment on 03/15/21.  Pam states she will inform her mother to bring her cell phone to her appointment so she can contacted.

## 2021-03-13 ENCOUNTER — Telehealth: Payer: Self-pay | Admitting: *Deleted

## 2021-03-13 NOTE — Telephone Encounter (Signed)
Notified daughter that it is OK for her to be on phone during the appt with Dr Alen Blew

## 2021-03-14 ENCOUNTER — Encounter (HOSPITAL_BASED_OUTPATIENT_CLINIC_OR_DEPARTMENT_OTHER): Payer: Medicare Other | Admitting: Internal Medicine

## 2021-03-14 ENCOUNTER — Other Ambulatory Visit: Payer: Self-pay

## 2021-03-14 DIAGNOSIS — L97822 Non-pressure chronic ulcer of other part of left lower leg with fat layer exposed: Secondary | ICD-10-CM

## 2021-03-14 DIAGNOSIS — C4492 Squamous cell carcinoma of skin, unspecified: Secondary | ICD-10-CM

## 2021-03-14 DIAGNOSIS — I872 Venous insufficiency (chronic) (peripheral): Secondary | ICD-10-CM | POA: Diagnosis not present

## 2021-03-14 DIAGNOSIS — L97812 Non-pressure chronic ulcer of other part of right lower leg with fat layer exposed: Secondary | ICD-10-CM | POA: Diagnosis not present

## 2021-03-14 NOTE — Progress Notes (Signed)
[name] ([id number]) , Visit Report for 03/14/2021 Chief Complaint Document Details Patient Name: Date of Service: [name], [place]. 03/14/2021 1:15 PM Medical Record Number: [id number] Patient Account Number: [id number] Date of Birth/Sex: Treating RN: 08-09-1925 (88 y.o. F) Primary Care Provider: [name] Other Clinician: Referring Provider: Treating Provider/Extender: [name] in Treatment: 51 Information Obtained from: Patient Chief Complaint Bilateral lower extremity wounds that have been biopsied and positive for squamous cell carcinoma Electronic Signature(s) Signed: 03/14/2021 1:55:25 PM By: Kalman Shan DO Entered By: Kalman Shan on 03/14/2021 13:50:09 -------------------------------------------------------------------------------- HPI Details Patient Name: Date of Service: [name], [name] 03/14/2021 1:15 PM Medical Record Number: [id number] Patient Account Number: [id number] Date of Birth/Sex: Treating RN: 08-09-1925 (88 y.o. 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 She states that the wound on the right has been present for a year. The 1 on the left has opened up 1 month ago. She is followed with oncology for this issue as she had biopsies that showed squamous cell carcinoma. She is also seeing radiation oncology for treatment options. She presents today because she would like for her wounds to be healed by [place]. She currently denies  signs of infection. 6/1; patient presents for 1 week follow-up. She states she has tolerated the leg wraps well. She states these do not bother her and is happy to continue with them. She is scheduled to see her oncologist today to go over treatment options for the bilateral lower extremity squamous cell carcinoma. Radiation is currently not a recommended option. Patient states she overall feels well. 6/22; patient presents for 3-week follow-up. She has tolerated the wraps well until her last wrap where she states they were uncomfortable. She attributes this to the home health nurse. She denies signs of infection. She has started her first treatment of antibody infusions for her Bilateral lower extremity squamous cell carcinoma. She has no complaints today. 7/21; patient presents for 1 month follow-up. Unfortunately she has not had good experience with her wrap changes with home health. She would like to do her own dressing changes. She continues to do her antibody infusions. She denies signs of infection. 7/28; patient presents for 1 week follow-up. At last clinic visit she was switched to daily dressing changes due to issues with the wrap and home health placing them. Unfortunately she has developed weeping to her legs bilaterally. She would like to be placed in wraps today. She would also like to follow with [place] weekly for wrap changes instead of having home health change them. She denies signs of infection. 8/4; patient presents for 1 week follow-up. She has tolerated the Kerlix/Coban wraps well. She no longer has weeping to her legs. She took the wrap off 1 day before coming in to be able to take a shower. She has no issues or complaints today. She denies signs of infection. 8/18; patient presents for follow-up. Patient has tolerated the wraps well. She brought her Velcro compression wraps today. She has no issues or complaints today. She had her chemotherapy infusion yesterday without issues. She  denies signs of infection. 8/25; patient presents for follow-up. She used her juxta lite compressions for the past week. It is unclear if she is able to put these on correctly since she states she has a hard time getting them to look right. She reports 2 open wounds. She currently denies signs of infection. 9/1; patient presents for 1 week follow-up. She has 1 open wound. She tolerated the compression wraps well. She currently denies signs of infection. 9/8; patient presents for 1 week follow-up. She has 2 open wounds 1 on each leg. She has tolerated the compression wrap well. She currently denies signs of infection. 9/15; patient presents for 1 week follow-up. She now has 3 wounds. 2 on the left and 1 on the right. She continues to tolerate the compression wrap well. She currently denies signs of infection. She has obtained furosemide by her primary care physician and would like to discuss when to take this. 9/22; patient presents for 1 week follow-up. She has scattered wounds on her lower extremities bilaterally. She did take furosemide twice in the past week. She does not recall having to urinate more frequently. She tolerated the 3 layer compression well. She denies signs of infection. 10/13; patient has not been here recently because of [place]. Apparently the facility was only putting gauze on her legs. This is a patient I do not normally see. 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 Ronnald Ramp at [facility] dermatology. She is quite convincing that she did not have radiation to her lower legs. It is likely she also has significant chronic venous insufficiency stasis dermatitis. We have been using silver alginate under kerlix Coban. She has obvious open areas medially on the right and areas on the left. She also has areas of extensive dry flaking adherent areas on the right and to a lesser extent on the left anterior.  Nodular areas on the left lateral lower leg left posterior calf and right mid calf medially. 10/21; patient presents for follow-up. She has no issues or complaints today. She has tolerated the 3 layer compression well bilaterally. She denies signs of infection. 10/27; patient presents for follow-up. She continues to tolerate the 3 layer compression wrap well. She denies signs of infection. 11/30; patient presents for follow-up. She has no issues or complaints today. 11/10; patient arrived in clinic today for nurse visit accompanied by her daughter from [place]. The daughter had multiple questions so we turned this into doctors visit. Apparently after the last time I saw this woman in October she went to see Dr. Ronnald Ramp but he did not take the wraps off. She previously was treated with Cemiplimab for her squamous cell carcinoma. She did not receive radiation to her legs. I had wanted Dr. Ronnald Ramp to look at this because of the exceptionally damaged skin on her lower legs bilaterally. She has odd looking wounds on the left medial lower leg also extending posteriorly which we have not made a lot of progress on. But she also has a raised hyperkeratotic nodules on the right anterior tibial area plaques on the left medial lower thigh. These are not areas that are under compression. We have been using silver alginate on any open areas Liberal TCA under 3 layer compression. 11/17; patient presents for follow-up. She had her wraps taken off yesterday for her dermatology appointment. She reports excessive weeping to her legs bilaterally after the wraps were taken off. She currently  denies signs of infection. 12/12/2020 upon evaluation today patient appears to be doing about the same in regard to her wounds. She was actually started on Cipro after having biopsies apparently at her dermatology clinic she does not have the results back from the actual biopsy but the culture did return and apparently they placed her on  the Cipro she started that just this morning. Other than that her legs appear to be doing okay at this time. 12/1; patient presents for follow-up. She has no issues or complaints today. She has started Lasix 40 mg daily to help with her bilateral leg swelling. 12/8; patient presents for follow-up. She has no issues or complaints today. 12/15; patient presents for 1 week follow-up. She has no issues or complaints today. 12/22; patient presents for follow-up. She has no issues or complaints today. 1/5; patient presents for follow-up. She has no issues or complaints today. She has tolerated the compression wraps well. 1/12; patient presents for follow-up. She is tolerated the compression wrap well and has no issues or complaints today. She denies signs of infection. 1/19; patient presents for follow-up. She took the compression wraps off 1 to 2 days ago to take a shower and did not have compression wraps replaced. She reports increased blistering throughout her legs bilaterally. She currently denies signs of infection. 1/26; patient presents for follow-up. She has no issues or complaints today. She tolerated the compression wraps well. She has not heard from oncology to schedule an appointment. 2/2; patient presents for follow-up. She has no issues or complaints today. She continues to tolerate the compression wrap well. 2/16; patient presents for follow-up. She has no issues or complaints today. 2/23; patient presents for follow-up. She has no issues or complaints today. Tomorrow she has an appointment with oncology to look at the suspicious lesion on her right lower extremity. She currently denies signs of infection. Electronic Signature(s) Signed: 03/14/2021 1:55:25 PM By: Kalman Shan DO Entered By: Kalman Shan on 03/14/2021 13:50:43 -------------------------------------------------------------------------------- Physical Exam Details Patient Name: Date of Service: [name], [name]  03/14/2021 1:15 PM Medical Record Number: [id number] Patient Account Number: [id number] Date of Birth/Sex: Treating RN: September 21, 1925 (88 y.o. F) Primary Care Provider: [name] Other Clinician: Referring Provider: Treating Provider/Extender: [name] in Treatment: 62 Constitutional respirations regular, non-labored and within target range for patient.. Cardiovascular 2+ dorsalis pedis/posterior tibialis pulses. Psychiatric pleasant and cooperative. Notes 1 wound present on the left lower extremity with granulation tissue present. There is a suspicious lesion to the right anterior leg where she previously had squamous cell carcinoma. Aside from that she had no wounds on the right lower extremity. No signs of surrounding infection. Electronic Signature(s) Signed: 03/14/2021 1:55:25 PM By: Kalman Shan DO Entered By: Kalman Shan on 03/14/2021 13:51:39 -------------------------------------------------------------------------------- Physician Orders Details Patient Name: Date of Service: [name], [name] 03/14/2021 1:15 PM Medical Record Number: [id number] Patient Account Number: [id number] Date of Birth/Sex: Treating RN: 11-28-1925 (88 y.o. [name] Primary Care Provider: [name] Other Clinician: Referring Provider: Treating Provider/Extender: [name] in Treatment: 37 Verbal / Phone Orders: No Diagnosis Coding Follow-up Appointments ppointment in 1 week. - Dr. He[place] Room 8 Return A Bathing/ Shower/ Hygiene May shower with protection but do not get wound dressing(s) wet. - Use cast protector Edema Control - Lymphedema / SCD / Other Elevate legs to the level of the heart or above for 30 minutes daily and/or when sitting, a frequency  of: - elevate the legs throughout the day heart level if possible. Avoid standing for long periods of time. Exercise regularly Additional  Orders / Instructions Follow Nutritious Diet Wound Treatment Wound #19 - Lower Leg Wound Laterality: Right, Circumferential Cleanser: Soap and Water 1 x Per Week/15 Days Discharge Instructions: May shower and wash wound with dial antibacterial soap and water prior to dressing change. Cleanser: Wound Cleanser 1 x Per Week/15 Days Discharge Instructions: Cleanse the wound with wound cleanser prior to applying a clean dressing using gauze sponges, not tissue or cotton balls. Peri-Wound Care: Triamcinolone 15 (g) 1 x Per Week/15 Days Discharge Instructions: In clinic only. Use triamcinolone 15 (g) in clinic only. Peri-Wound Care: Sween Lotion (Moisturizing lotion) 1 x Per Week/15 Days Discharge Instructions: Apply moisturizing lotion as directed Prim Dressing: KerraCel Ag Gelling Fiber Dressing, 4x5 in (silver alginate) 1 x Per Week/15 Days ary Discharge Instructions: Apply silver alginate to wound bed as instructed Secondary Dressing: Woven Gauze Sponge, Non-Sterile 4x4 in 1 x Per Week/15 Days Discharge Instructions: Apply over primary dressing as directed. Compression Wrap: Kerlix Roll 4.5x3.1 (in/yd) 1 x Per Week/15 Days Discharge Instructions: Apply Kerlix and Coban compression as directed. Compression Stockings: Circaid Juxta Lite Compression Wrap Right Leg Compression Amount: 20-30 mmHG Discharge Instructions: Apply Circaid Juxta Lite Compression Wrap daily as instructed. Apply first thing in the morning, remove at night before bed. Wound #7R - Lower Leg Wound Laterality: Left, Circumferential Cleanser: Soap and Water 1 x Per Week/15 Days Discharge Instructions: May shower and wash wound with dial antibacterial soap and water prior to dressing change. Cleanser: Wound Cleanser 1 x Per Week/15 Days Discharge Instructions: Cleanse the wound with wound cleanser prior to applying a clean dressing using gauze sponges, not tissue or cotton balls. Peri-Wound Care: Triamcinolone 15 (g) 1 x Per  Week/15 Days Discharge Instructions: In clinic only. Use triamcinolone 15 (g) in clinic only. Peri-Wound Care: Sween Lotion (Moisturizing lotion) 1 x Per Week/15 Days Discharge Instructions: Apply moisturizing lotion as directed Prim Dressing: KerraCel Ag Gelling Fiber Dressing, 4x5 in (silver alginate) 1 x Per Week/15 Days ary Discharge Instructions: Apply silver alginate to wound bed as instructed Secondary Dressing: Woven Gauze Sponge, Non-Sterile 4x4 in 1 x Per Week/15 Days Discharge Instructions: Apply over primary dressing as directed. Secondary Dressing: ABD Pad, 8x10 1 x Per Week/15 Days Discharge Instructions: Apply over primary dressing as directed. Compression Wrap: ThreePress (3 layer compression wrap) 1 x Per Week/15 Days Discharge Instructions: Apply three layer compression as directed. Electronic Signature(s) Signed: 03/14/2021 1:55:25 PM By: Kalman Shan DO Entered By: Kalman Shan on 03/14/2021 13:51:58 -------------------------------------------------------------------------------- Problem Bell Details Patient Name: Date of Service: [name], [name] 03/14/2021 1:15 PM Medical Record Number: [id number] Patient Account Number: [id number] Date of Birth/Sex: Treating RN: 1925-04-04 (88 y.o. F) Primary Care Provider: [name] Other Clinician: Referring Provider: Treating Provider/Extender: [name] in Treatment: 48 Active Problems ICD-10 Encounter Code Description Active Date MDM Diagnosis C44.92 Squamous cell carcinoma of skin, unspecified 06/11/2020 No Yes L97.812 Non-pressure chronic ulcer of other part of right lower leg with fat layer 06/11/2020 No Yes exposed L97.822 Non-pressure chronic ulcer of other part of left lower leg with fat layer exposed5/23/2022 No Yes I87.2 Venous insufficiency (chronic) (peripheral) 06/11/2020 No Yes I89.0 Lymphedema, not elsewhere classified 01/31/2021 No Yes Inactive  Problems Resolved Problems Electronic Signature(s) Signed: 03/14/2021 1:55:25 PM By: Kalman Shan DO Entered By: Kalman Shan on 03/14/2021 13:49:41 -------------------------------------------------------------------------------- Progress Note Details Patient  Name: Date of Service: [name], [name] 03/14/2021 1:15 PM Medical Record Number: [id number] Patient Account Number: [id number] Date of Birth/Sex: Treating RN: Jun 24, 1925 (88 y.o. F) Primary Care Provider: [name] Other Clinician: Referring Provider: Treating Provider/Extender: [name] in Treatment: 69 Subjective Chief Complaint Information obtained from Patient Bilateral lower extremity wounds that have been biopsied and positive for squamous cell carcinoma History of Present Illness (HPI) The following HPI elements were documented for the patient's wound: Location: left leg Admission 5/23 Ms. 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 She states that the wound on the right has been present for a year. The 1 on the left has opened up 1 month ago. She is followed with oncology for this issue as she had biopsies that showed squamous cell carcinoma. She is also seeing radiation oncology for treatment options. She presents today because she would like for her wounds to be healed by [place]. She currently denies signs of infection. 6/1; patient presents for 1 week follow-up. She states she has tolerated the leg wraps well. She states these do not bother her and is happy to continue with them. She is scheduled to see her oncologist today to go over treatment options for the bilateral lower extremity squamous cell carcinoma. Radiation is currently not a  recommended option. Patient states she overall feels well. 6/22; patient presents for 3-week follow-up. She has tolerated the wraps well until her last wrap where she states they were uncomfortable. She attributes this to the home health nurse. She denies signs of infection. She has started her first treatment of antibody infusions for her Bilateral lower extremity squamous cell carcinoma. She has no complaints today. 7/21; patient presents for 1 month follow-up. Unfortunately she has not had good experience with her wrap changes with home health. She would like to do her own dressing changes. She continues to do her antibody infusions. She denies signs of infection. 7/28; patient presents for 1 week follow-up. At last clinic visit she was switched to daily dressing changes due to issues with the wrap and home health placing them. Unfortunately she has developed weeping to her legs bilaterally. She would like to be placed in wraps today. She would also like to follow with [place] weekly for wrap changes instead of having home health change them. She denies signs of infection. 8/4; patient presents for 1 week follow-up. She has tolerated the Kerlix/Coban wraps well. She no longer has weeping to her legs. She took the wrap off 1 day before coming in to be able to take a shower. She has no issues or complaints today. She denies signs of infection. 8/18; patient presents for follow-up. Patient has tolerated the wraps well. She brought her Velcro compression wraps today. She has no issues or complaints today. She had her chemotherapy infusion yesterday without issues. She denies signs of infection. 8/25; patient presents for follow-up. She used her juxta lite compressions for the past week. It is unclear if she is able to put these on correctly since she states she has a hard time getting them to look right. She reports 2 open wounds. She currently denies signs of infection. 9/1; patient presents for 1 week  follow-up. She has 1 open wound. She tolerated the compression wraps well. She  currently denies signs of infection. 9/8; patient presents for 1 week follow-up. She has 2 open wounds 1 on each leg. She has tolerated the compression wrap well. She currently denies signs of infection. 9/15; patient presents for 1 week follow-up. She now has 3 wounds. 2 on the left and 1 on the right. She continues to tolerate the compression wrap well. She currently denies signs of infection. She has obtained furosemide by her primary care physician and would like to discuss when to take this. 9/22; patient presents for 1 week follow-up. She has scattered wounds on her lower extremities bilaterally. She did take furosemide twice in the past week. She does not recall having to urinate more frequently. She tolerated the 3 layer compression well. She denies signs of infection. 10/13; patient has not been here recently because of [place]. Apparently the facility was only putting gauze on her legs. This is a patient I do not normally see. 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 Ronnald Ramp at [facility] dermatology. She is quite convincing that she did not have radiation to her lower legs. It is likely she also has significant chronic venous insufficiency stasis dermatitis. We have been using silver alginate under kerlix Coban. She has obvious open areas medially on the right and areas on the left. She also has areas of extensive dry flaking adherent areas on the right and to a lesser extent on the left anterior. Nodular areas on the left lateral lower leg left posterior calf and right mid calf medially. 10/21; patient presents for follow-up. She has no issues or complaints today. She has tolerated the 3 layer compression well bilaterally. She denies signs of infection. 10/27; patient presents for follow-up. She continues to tolerate the 3 layer  compression wrap well. She denies signs of infection. 11/30; patient presents for follow-up. She has no issues or complaints today. 11/10; patient arrived in clinic today for nurse visit accompanied by her daughter from [place]. The daughter had multiple questions so we turned this into doctors visit. Apparently after the last time I saw this woman in October she went to see Dr. Ronnald Ramp but he did not take the wraps off. She previously was treated with Cemiplimab for her squamous cell carcinoma. She did not receive radiation to her legs. I had wanted Dr. Ronnald Ramp to look at this because of the exceptionally damaged skin on her lower legs bilaterally. She has odd looking wounds on the left medial lower leg also extending posteriorly which we have not made a lot of progress on. But she also has a raised hyperkeratotic nodules on the right anterior tibial area plaques on the left medial lower thigh. These are not areas that are under compression. We have been using silver alginate on any open areas Liberal TCA under 3 layer compression. 11/17; patient presents for follow-up. She had her wraps taken off yesterday for her dermatology appointment. She reports excessive weeping to her legs bilaterally after the wraps were taken off. She currently denies signs of infection. 12/12/2020 upon evaluation today patient appears to be doing about the same in regard to her wounds. She was actually started on Cipro after having biopsies apparently at her dermatology clinic she does not have the results back from the actual biopsy but the culture did return and apparently they placed her on the Cipro she started that just this morning. Other than that her legs appear to be doing  okay at this time. 12/1; patient presents for follow-up. She has no issues or complaints today. She has started Lasix 40 mg daily to help with her bilateral leg swelling. 12/8; patient presents for follow-up. She has no issues or complaints  today. 12/15; patient presents for 1 week follow-up. She has no issues or complaints today. 12/22; patient presents for follow-up. She has no issues or complaints today. 1/5; patient presents for follow-up. She has no issues or complaints today. She has tolerated the compression wraps well. 1/12; patient presents for follow-up. She is tolerated the compression wrap well and has no issues or complaints today. She denies signs of infection. 1/19; patient presents for follow-up. She took the compression wraps off 1 to 2 days ago to take a shower and did not have compression wraps replaced. She reports increased blistering throughout her legs bilaterally. She currently denies signs of infection. 1/26; patient presents for follow-up. She has no issues or complaints today. She tolerated the compression wraps well. She has not heard from oncology to schedule an appointment. 2/2; patient presents for follow-up. She has no issues or complaints today. She continues to tolerate the compression wrap well. 2/16; patient presents for follow-up. She has no issues or complaints today. 2/23; patient presents for follow-up. She has no issues or complaints today. Tomorrow she has an appointment with oncology to look at the suspicious lesion on her right lower extremity. She currently denies signs of infection. Patient History Information obtained from Patient. Family History Unknown History. Social History Never smoker, Marital Status - Single, Alcohol Use - Never, Drug Use - No History, Caffeine Use - Never. Medical History Eyes Denies history of Cataracts, Optic Neuritis Ear/Nose/Mouth/Throat Denies history of Chronic sinus problems/congestion, Middle ear problems Hematologic/Lymphatic Patient has history of Anemia Denies history of Hemophilia, Human Immunodeficiency Virus, Lymphedema, Sickle Cell Disease Respiratory Denies history of Aspiration, Asthma, Chronic Obstructive Pulmonary Disease (COPD),  Pneumothorax, Sleep Apnea, Tuberculosis Cardiovascular Patient has history of Arrhythmia - Atrial Flutter, A fibb, Congestive Heart Failure, Hypertension Denies history of Angina, Coronary Artery Disease, Deep Vein Thrombosis, Hypotension, Myocardial Infarction, Peripheral Arterial Disease, Peripheral Venous Disease, Phlebitis, Vasculitis Gastrointestinal Patient has history of Colitis Denies history of Cirrhosis , Crohnoos, Hepatitis A, Hepatitis B, Hepatitis C Endocrine Denies history of Type I Diabetes, Type II Diabetes Genitourinary Denies history of End Stage Renal Disease Immunological Denies history of Lupus Erythematosus, Raynaudoos, Scleroderma Integumentary (Skin) Denies history of History of Burn Musculoskeletal Patient has history of Osteoarthritis Denies history of Gout, Rheumatoid Arthritis, Osteomyelitis Neurologic Denies history of Dementia, Neuropathy, Quadriplegia, Paraplegia, Seizure Disorder Oncologic Denies history of Received Chemotherapy, Received Radiation Hospitalization/Surgery History - removal of rod left leg. 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 Vitals Time Taken: 1:08 PM, Height: 65 in, Weight: 163 lbs, BMI: 27.1, Temperature: 97.7 F, Pulse: 74 bpm, Respiratory Rate: 20 breaths/min, Blood Pressure: 127/69 mmHg. Cardiovascular 2+ dorsalis pedis/posterior tibialis pulses. Psychiatric pleasant and cooperative. General Notes: 1 wound present on the left lower extremity with granulation tissue present. There is a suspicious lesion to the right anterior leg where she previously had squamous cell carcinoma. Aside from that she had no wounds on the right lower extremity. No signs of surrounding infection. Integumentary (Hair,  Skin) Wound #19 status is Open. Original cause of wound was Blister. The date acquired was: 02/06/2021. The wound has been in treatment 5  weeks. The wound is located on the Right,Circumferential Lower Leg. The wound measures 1.5cm length x 1.7cm width x 0.1cm depth; 2.003cm^2 area and 0.2cm^3 volume. There is Fat Layer (Subcutaneous Tissue) exposed. There is no tunneling or undermining noted. There is a small amount of serosanguineous drainage noted. The wound margin is distinct with the outline attached to the wound base. There is large (67-100%) granulation within the wound bed. There is a small (1-33%) amount of necrotic tissue within the wound bed including Adherent Slough. Wound #7R status is Open. Original cause of wound was Blister. The date acquired was: 04/20/2020. The wound has been in treatment 39 weeks. The wound is located on the Left,Circumferential Lower Leg. The wound measures 1.5cm length x 1.5cm width x 0.1cm depth; 1.767cm^2 area and 0.177cm^3 volume. There is Fat Layer (Subcutaneous Tissue) exposed. There is no tunneling or undermining noted. There is a medium amount of serosanguineous drainage noted. The wound margin is distinct with the outline attached to the wound base. There is large (67-100%) red granulation within the wound bed. There is a small (1-33%) amount of necrotic tissue within the wound bed including Adherent Slough. Assessment Active Problems ICD-10 Squamous cell carcinoma of skin, unspecified Non-pressure chronic ulcer of other part of right lower leg with fat layer exposed Non-pressure chronic ulcer of other part of left lower leg with fat layer exposed Venous insufficiency (chronic) (peripheral) Lymphedema, not elsewhere classified Patient has 1 remaining wound to the left lower extremity. I recommended continuing silver alginate and 3 layer compression to this. We will put her right lower extremity in juxta light compression. She sees her oncologist tomorrow  to reassess the right anterior lesion that previously had squamous cell carcinoma. No signs of surrounding infection. Follow-up in 1 week Plan Follow-up Appointments: Return Appointment in 1 week. - Dr. Heber [place] Room 8 Bathing/ Shower/ Hygiene: May shower with protection but do not get wound dressing(s) wet. - Use cast protector Edema Control - Lymphedema / SCD / Other: Elevate legs to the level of the heart or above for 30 minutes daily and/or when sitting, a frequency of: - elevate the legs throughout the day heart level if possible. Avoid standing for long periods of time. Exercise regularly Additional Orders / Instructions: Follow Nutritious Diet WOUND #19: - Lower Leg Wound Laterality: Right, Circumferential Cleanser: Soap and Water 1 x Per Week/15 Days Discharge Instructions: May shower and wash wound with dial antibacterial soap and water prior to dressing change. Cleanser: Wound Cleanser 1 x Per Week/15 Days Discharge Instructions: Cleanse the wound with wound cleanser prior to applying a clean dressing using gauze sponges, not tissue or cotton balls. Peri-Wound Care: Triamcinolone 15 (g) 1 x Per Week/15 Days Discharge Instructions: In clinic only. Use triamcinolone 15 (g) in clinic only. Peri-Wound Care: Sween Lotion (Moisturizing lotion) 1 x Per Week/15 Days Discharge Instructions: Apply moisturizing lotion as directed Prim Dressing: KerraCel Ag Gelling Fiber Dressing, 4x5 in (silver alginate) 1 x Per Week/15 Days ary Discharge Instructions: Apply silver alginate to wound bed as instructed Secondary Dressing: Woven Gauze Sponge, Non-Sterile 4x4 in 1 x Per Week/15 Days Discharge Instructions: Apply over primary dressing as directed. Com pression Wrap: Kerlix Roll 4.5x3.1 (in/yd) 1 x Per Week/15 Days Discharge Instructions: Apply Kerlix and Coban compression as directed. Com pression Stockings: Circaid Juxta Lite Compression Wrap Compression Amount: 20-30 mmHg (right) Discharge  Instructions: Apply Circaid Juxta Lite Compression Wrap daily as instructed. Apply first thing in the morning, remove at night before bed. WOUND #  7R: - Lower Leg Wound Laterality: Left, Circumferential Cleanser: Soap and Water 1 x Per Week/15 Days Discharge Instructions: May shower and wash wound with dial antibacterial soap and water prior to dressing change. Cleanser: Wound Cleanser 1 x Per Week/15 Days Discharge Instructions: Cleanse the wound with wound cleanser prior to applying a clean dressing using gauze sponges, not tissue or cotton balls. Peri-Wound Care: Triamcinolone 15 (g) 1 x Per Week/15 Days Discharge Instructions: In clinic only. Use triamcinolone 15 (g) in clinic only. Peri-Wound Care: Sween Lotion (Moisturizing lotion) 1 x Per Week/15 Days Discharge Instructions: Apply moisturizing lotion as directed Prim Dressing: KerraCel Ag Gelling Fiber Dressing, 4x5 in (silver alginate) 1 x Per Week/15 Days ary Discharge Instructions: Apply silver alginate to wound bed as instructed Secondary Dressing: Woven Gauze Sponge, Non-Sterile 4x4 in 1 x Per Week/15 Days Discharge Instructions: Apply over primary dressing as directed. Secondary Dressing: ABD Pad, 8x10 1 x Per Week/15 Days Discharge Instructions: Apply over primary dressing as directed. Com pression Wrap: ThreePress (3 layer compression wrap) 1 x Per Week/15 Days Discharge Instructions: Apply three layer compression as directed. 1. Silver alginate with under 3 layer compression to the left lower extremity 2. Juxta lite to the right lower extremity 3. Follow-up in 1 week Electronic Signature(s) Signed: 03/14/2021 1:55:25 PM By: Kalman Shan DO Entered By: Kalman Shan on 03/14/2021 13:54:15 -------------------------------------------------------------------------------- HxROS Details Patient Name: Date of Service: [name], [place] A. 03/14/2021 1:15 PM Medical Record Number: [id number] Patient Account Number:  [id number] Date of Birth/Sex: Treating RN: Jan 31, 1925 (88 y.o. F) Primary Care Provider: [name] Other Clinician: Referring Provider: Treating Provider/Extender: [name] in Treatment: 50 Information Obtained From Patient Eyes Medical History: Negative for: Cataracts; Optic Neuritis Ear/Nose/Mouth/Throat Medical History: Negative for: Chronic sinus problems/congestion; Middle ear problems Hematologic/Lymphatic Medical History: Positive for: Anemia Negative for: Hemophilia; Human Immunodeficiency Virus; Lymphedema; Sickle Cell Disease Respiratory Medical History: Negative for: Aspiration; Asthma; Chronic Obstructive Pulmonary Disease (COPD); Pneumothorax; Sleep Apnea; Tuberculosis Cardiovascular Medical History: Positive for: Arrhythmia - Atrial Flutter, A fibb; Congestive Heart Failure; Hypertension Negative for: Angina; Coronary Artery Disease; Deep Vein Thrombosis; Hypotension; Myocardial Infarction; Peripheral Arterial Disease; Peripheral Venous Disease; Phlebitis; Vasculitis Past Medical History Notes: hyperlipidemia Gastrointestinal Medical History: Positive for: Colitis Negative for: Cirrhosis ; Crohns; Hepatitis A; Hepatitis B; Hepatitis C Endocrine Medical History: Negative for: Type I Diabetes; Type II Diabetes Past Medical History Notes: hypothyroidism Genitourinary Medical History: Negative for: End Stage Renal Disease Immunological Medical History: Negative for: Lupus Erythematosus; Raynauds; Scleroderma Integumentary (Skin) Medical History: Negative for: History of Burn Musculoskeletal Medical History: Positive for: Osteoarthritis Negative for: Gout; Rheumatoid Arthritis; Osteomyelitis Neurologic Medical History: Negative for: Dementia; Neuropathy; Quadriplegia; Paraplegia; Seizure Disorder Past Medical History Notes: lumbar spindylolysis Oncologic Medical History: Negative for: Received  Chemotherapy; Received Radiation Past Medical History Notes: BLE squamous ceel carcionoma Immunizations Pneumococcal Vaccine: Received Pneumococcal Vaccination: Yes Received Pneumococcal Vaccination On or After 60th Birthday: No Implantable Devices None Hospitalization / Surgery History Type of Hospitalization/Surgery removal of rod left leg Family and Social History Unknown History: Yes; Never smoker; Marital Status - Single; Alcohol Use: Never; Drug Use: No History; Caffeine Use: Never; Financial Concerns: No; Food, Clothing or Shelter Needs: No; Support System Lacking: No; Transportation Concerns: No Electronic Signature(s) Signed: 03/14/2021 1:55:25 PM By: Kalman Shan DO Entered By: Kalman Shan on 03/14/2021 13:50:51 -------------------------------------------------------------------------------- SuperBill Details Patient Name: Date of Service: [name], [place]. 03/14/2021 Medical Record Number: [id number] Patient Account Number: [id number] Date of  Birth/Sex: Treating RN: [birth year] (88 y.o. F) Primary Care Provider: [name] Other Clinician: Referring Provider: Treating Provider/Extender: [name] in Treatment: 39 Diagnosis Coding ICD-10 Codes Code Description C44.92 Squamous cell carcinoma of skin, unspecified L97.812 Non-pressure chronic ulcer of other part of right lower leg with fat layer exposed L97.822 Non-pressure chronic ulcer of other part of left lower leg with fat layer exposed I87.2 Venous insufficiency (chronic) (peripheral) I89.0 Lymphedema, not elsewhere classified Physician Procedures : CPT4 Code Description Modifier [id number] - WC PHYS LEVEL 3 - EST PT ICD-10 Diagnosis Description Q98.264 Non-pressure chronic ulcer of other part of right lower leg with fat layer exposed L97.822 Non-pressure chronic ulcer of other part of left  lower leg with fat layer exposed C44.92 Squamous cell carcinoma of  skin, unspecified I87.2 Venous insufficiency (chronic) (peripheral) Quantity: 1 Electronic Signature(s) Signed: 03/14/2021 1:55:25 PM By: Kalman Shan DO Entered By: Kalman Shan on 03/14/2021 13:54:34

## 2021-03-15 ENCOUNTER — Inpatient Hospital Stay: Payer: Medicare Other | Attending: Oncology | Admitting: Oncology

## 2021-03-15 VITALS — BP 133/62 | HR 93 | Temp 97.6°F | Resp 17 | Ht 65.0 in | Wt 154.4 lb

## 2021-03-15 DIAGNOSIS — C50412 Malignant neoplasm of upper-outer quadrant of left female breast: Secondary | ICD-10-CM

## 2021-03-15 DIAGNOSIS — C44721 Squamous cell carcinoma of skin of unspecified lower limb, including hip: Secondary | ICD-10-CM

## 2021-03-15 DIAGNOSIS — Z170 Estrogen receptor positive status [ER+]: Secondary | ICD-10-CM | POA: Diagnosis not present

## 2021-03-15 NOTE — Progress Notes (Signed)
Reason for the request:    Squamous cell carcinoma  HPI: I was asked by Dr. 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  She is a 88 year old woman with history of breast cancer diagnosed in 2017 and found to have invasive ductal carcinoma measuring 0.9 cm.  She underwent lumpectomy followed by anastrozole completed in May 2022.  She developed bilateral lower extremity squamous cell carcinoma and was treated with Libtayo under the care of Dr. Jana Hakim in June 2022.  She received 5 cycles of therapy concluded on September 26, 2020.  She had an excellent response to therapy and developed skin rash that was unclear if it was related to Libtayo.  She continues to follow with Dr. Heber [place] at the wound care clinic and there was concern about recurrence of squamous cell carcinoma.  Clinically, she reports no major changes in her health.  She continues to reside in a senior living facility and ambulates with the help of a walker.  She denies any skin rash or pruritus.  Performance status and quality of life remains unchanged.  She does not report any headaches, blurry vision, syncope or seizures. Does not report any fevers, chills or sweats.  Does not report any cough, wheezing or hemoptysis.  Does not report any chest pain, palpitation, orthopnea or leg edema.  Does not report any nausea, vomiting or abdominal pain.  Does not report any constipation or diarrhea.  Does not report any skeletal complaints.    Does not report frequency, urgency or hematuria.  Does not report any skin rashes or lesions. Does not report any heat or cold intolerance.  Does not report any lymphadenopathy or petechiae.  Does not report any anxiety or depression.  Remaining review of systems is negative.     Past Medical History:  Diagnosis Date   Anxiety    denies   Atrial flutter ([facility])    Bilateral edema of lower extremity    Breast cancer (HCC)    left breast   Chronic constipation     Chronic diastolic CHF (congestive heart failure) (HCC)    Diverticulosis of colon    GERD (gastroesophageal reflux disease)    H/O hiatal hernia    History of cellulitis    LEFT LOWER LEG --  SEPT 2015   History of colitis    DX  2003  WITH ISCHEMIC COLITIS   History of diverticulitis of colon    perferated diverticulitis w/ abscess 08-25-2013 drain placed --  resolved without surgical intervention   History of GI bleed    2011--- UPPER SECONARY GASTRIC ULCER FROM NSAID USE   History of Helicobacter pylori infection    2011   History of ulcer of lower limb    2012   RIGHT LOWER EXTREMITIY--  STATSIS ULCER   Hyperlipidemia    Hypertension    Hypothyroidism    Iron deficiency anemia 2011   elevated MCV   LBP (low back pain)    severe lumbar spondylosis s/p L3/L4,L4/L5 fusion 12/10   Lumbar spondylolysis    OA (osteoarthritis)    "hands" (08/25/2013)   Open wound of left lower leg    Osteopenia    Persistent atrial fibrillation ([place])    CARDIOLOGIST-   DR MCALANY   RBBB    Urinary frequency   :   Past Surgical History:  Procedure Laterality Date   APPLICATION OF A-CELL OF EXTREMITY Left 06/21/2013   Procedure: APPLICATION OF A-CELL OF EXTREMITY;  Surgeon: Theodoro Kos, DO;  Location: [place];  Service: Plastics;  Laterality: Left;   APPLICATION OF A-CELL OF EXTREMITY Left 08/22/2013   Procedure: APPLICATION OF A-CELL/VAC TO LOWER LEFT LEG WOUND;  Surgeon: Theodoro Kos, DO;  Location: [place];  Service: Plastics;  Laterality: Left;   APPLICATION OF A-CELL OF EXTREMITY Left 02/06/2014   Procedure: WITH PLACEMENT OF A -CELL ;  Surgeon: Theodoro Kos, DO;  Location: [place];  Service: Plastics;  Laterality: Left;   APPLICATION OF A-CELL OF EXTREMITY Left 08/09/2014   Procedure: PLACEMENT OF A CELL;  Surgeon: Theodoro Kos, DO;  Location: [place];  Service: Plastics;  Laterality: Left;   APPLICATION OF WOUND VAC Left 06/16/2013   Procedure: APPLICATION  OF WOUND VAC;  Surgeon: Meredith Pel, MD;  Location: [place];  Service: Orthopedics;  Laterality: Left;   APPLICATION OF WOUND VAC Left 06/21/2013   Procedure: APPLICATION OF WOUND VAC;  Surgeon: Theodoro Kos, DO;  Location: [place];  Service: Plastics;  Laterality: Left;   BIOPSY BREAST  07/25/2015   LEFT RADIOACTIVE SEED GUIDED EXCISIONAL BREAST BIOPSY (Left) as a surgical intervention   EXTERNAL FIXATION LEG Left 06/16/2013   Procedure: EXTERNAL FIXATION LEG;  Surgeon: Meredith Pel, MD;  Location: [facility];  Service: Orthopedics;  Laterality: Left;   EXTERNAL FIXATION REMOVAL Left 06/21/2013   Procedure: REMOVAL EXTERNAL FIXATION LEG;  Surgeon: Meredith Pel, MD;  Location: [facility];  Service: Orthopedics;  Laterality: Left;   EYE SURGERY Bilateral    cataract removal   HARDWARE REMOVAL Left 05/23/2014   Procedure: HARDWARE REMOVAL;  Surgeon: Meredith Pel, MD;  Location: [place];  Service: Orthopedics;  Laterality: Left;  REMOVAL OF HARDWARE LEFT TIBIA   I & D EXTREMITY Left 06/16/2013   Procedure: IRRIGATION AND DEBRIDEMENT EXTREMITY;  Surgeon: Meredith Pel, MD;  Location: [place];  Service: Orthopedics;  Laterality: Left;   I & D EXTREMITY Left 02/06/2014   Procedure: IRRIGATION AND DEBRIDEMENT LEFT LEG WOUND ;  Surgeon: Theodoro Kos, DO;  Location: [place];  Service: Plastics;  Laterality: Left;   I & D EXTREMITY Left 08/09/2014   Procedure: IRRIGATION AND DEBRIDEMENT  OF LEFT LOWER LEG;  Surgeon: Theodoro Kos, DO;  Location: [place];  Service: Plastics;  Laterality: Left;   LUMBAR FUSION  01-02-2009   L3-L5   RADIOACTIVE SEED GUIDED EXCISIONAL BREAST BIOPSY Left 07/25/2015   Procedure: LEFT RADIOACTIVE SEED GUIDED EXCISIONAL BREAST BIOPSY;  Surgeon: Rolm Bookbinder, MD;  Location: [facility];  Service: General;  Laterality: Left;   RE-EXCISION OF BREAST LUMPECTOMY Left 09/05/2015   Procedure: RE-EXCISION OF LEFT BREAST LUMPECTOMY;  Surgeon: Rolm Bookbinder, MD;   Location: [place];  Service: General;  Laterality: Left;   SHOULDER OPEN ROTATOR CUFF REPAIR Left 1997   TIBIA IM NAIL INSERTION Left 06/21/2013   Procedure: INTRAMEDULLARY (IM) NAIL TIBIAL;  Surgeon: Meredith Pel, MD;  Location: [facility];  Service: Orthopedics;  Laterality: Left;   TONSILLECTOMY  AS CHILD   TOTAL HIP ARTHROPLASTY Right 06-07-2010   TRANSTHORACIC ECHOCARDIOGRAM  09-11-2010   DR MCALHANY   LVSF  55-60%/  MILD MV CALCIFICATION WITH NO STENOSIS/  MILD MR & TR / MODERATE LAE/  MODERATE TO SEVERE RAE   UPPER GI ENDOSCOPY  03-29-2010  :   Current Outpatient Medications:    acetaminophen (TYLENOL) 500 MG tablet, Take 500 mg by mouth daily as needed for mild pain. , Disp: ,  Rfl:    apixaban (ELIQUIS) 5 MG TABS tablet, TAKE 1 TABLET BY MOUTH TWICE DAILY, Disp: 60 tablet, Rfl: 5   B Complex-C (B-COMPLEX WITH VITAMIN C) tablet, Take 1 tablet by mouth daily., Disp: , Rfl:    busPIRone (BUSPAR) 15 MG tablet, TAKE 1 TABLET BY MOUTH TWICE DAILY, Disp: 180 tablet, Rfl: 3   Cholecalciferol (VITAMIN D3) 1000 UNITS tablet, Take 1,000 Units by mouth daily., Disp: , Rfl:    colestipol (COLESTID) 1 g tablet, Take 1 tablet (1 g total) by mouth in the morning., Disp: 60 tablet, Rfl: 12   diltiazem (CARDIZEM CD) 120 MG 24 hr capsule, TAKE 1 CAPSULE BY MOUTH ONCE DAILY, Disp: 30 capsule, Rfl: 11   doxycycline (VIBRA-TABS) 100 MG tablet, TAKE 1 TABLET BY MOUTH ONCE DAILY, Disp: 30 tablet, Rfl: 11   famotidine (PEPCID) 20 MG tablet, Take 20 mg by mouth daily., Disp: , Rfl:    furosemide (LASIX) 40 MG tablet, Take 1 tablet (40 mg total) by mouth daily as needed., Disp: 30 tablet, Rfl: 5   gabapentin (NEURONTIN) 100 MG capsule, Take 1 capsule (100 mg total) by mouth at bedtime. Pain, Disp: 90 capsule, Rfl: 3   hydrocortisone 2.5 % lotion, Apply topically 3 (three) times daily., Disp: 240 mL, Rfl: 2   levothyroxine (SYNTHROID) 25 MCG tablet, TAKE 1 TABLET BY MOUTH ONCE DAILY FOR THYROID *TAKE ON AN EMPTY  STOMACH*, Disp: 30 tablet, Rfl: 11   linaclotide (LINZESS) 290 MCG CAPS capsule, TAKE 1 TABLET BY MOUTH ONCE DAILY *TAKE 30 MINUTES PRIOR TO FIRST MEAL* *TAKE ON AN EMPTY STOMACH* *DO NOT CRUSH OR CHEW*, Disp: 30 capsule, Rfl: 5   loperamide (IMODIUM) 2 MG capsule, Take 1 capsule (2 mg total) by mouth as needed for diarrhea or loose stools. Take 1 tab with first loose stool then one tab after each loose stool for maximum dose of 8 tabs daily. Pt to call the office if need for greater then 8 tabs a day, Disp: 60 capsule, Rfl: 0   methylPREDNISolone (MEDROL DOSEPAK) 4 MG TBPK tablet, As directed, Disp: 21 tablet, Rfl: 0   metoprolol succinate (TOPROL-XL) 50 MG 24 hr tablet, TAKE 1/2 TABLET = 25 MG TWICE DAILY. TAKE WITH OR IMMEDIATELY FOLLOWING A MEAL, Disp: 30 tablet, Rfl: 11   Multiple Vitamins-Minerals (MULTIVITAMIN WITH MINERALS) tablet, Take 1 tablet by mouth daily., Disp: , Rfl:    polyethylene glycol (MIRALAX) 17 g packet, Take 17 g by mouth daily., Disp: 30 each, Rfl: 0   potassium chloride SA (KLOR-CON M) 20 MEQ tablet, TAKE 1 TABLET BY MOUTH ONCE DAILY  ALONG W/ FUROSEMIDE AS NEED *TAKE WITH FOOD* *DO NOT CRUSH OR CHEW* *MAY DISSOLVE*, Disp: 30 tablet, Rfl: 11   potassium chloride SA (KLOR-CON M) 20 MEQ tablet, Take 1 tablet (20 mEq total) by mouth daily. Take along w/ Furosemide as need, Disp: 30 tablet, Rfl: 3   prochlorperazine (COMPAZINE) 5 MG tablet, Take 1 tablet (5 mg total) by mouth every 8 (eight) hours as needed for nausea or vomiting., Disp: 20 tablet, Rfl: 1   temazepam (RESTORIL) 15 MG capsule, TAKE 1 CAPSULE BY MOUTH EVERY NIGHT AT BEDTIME AS NEEDED FOR SLEEP, Disp: 60 capsule, Rfl: 3:   Allergies  Allergen Reactions   Zosyn [Piperacillin Sod-Tazobactam So] Hives, Itching, Rash and Other (See Comments)    Morbilliform eruptions with itching- looks like Measles   Atorvastatin     myalgias   Atenolol Nausea And Vomiting   Clarithromycin  Nausea And Vomiting   Codeine Sulfate  Nausea Only   Levaquin [Levofloxacin] Nausea Only   Macrodantin Other (See Comments)    UNSPECIFIED    Oxycodone-Acetaminophen Nausea And Vomiting  :   Family History  Problem Relation Age of Onset   Cancer Father    Hypertension Other   :   Social History   Socioeconomic History   Marital status: Married    Spouse name: Not on file   Number of children: 2   Years of education: Not on file   Highest education level: Not on file  Occupational History   Occupation: retired    Fish farm manager: UNEMPLOYED  Tobacco Use   Smoking status: Never   Smokeless tobacco: Never  Vaping Use   Vaping Use: Never used  Substance and Sexual Activity   Alcohol use: No    Comment: 1 glass occ wine 08/03/14- none in a year   Drug use: No   Sexual activity: Not on file  Other Topics Concern   Not on file  Social History Narrative   Right handed   Lives with husband   Doesn't use the steps   Social Determinants of Health   Financial Resource Strain: Not on file  Food Insecurity: Not on file  Transportation Needs: Not on file  Physical Activity: Not on file  Stress: Not on file  Social Connections: Not on file  Intimate Partner Violence: Not on file  :  Pertinent items are noted in HPI.  Exam:  General appearance: alert and cooperative appeared without distress. Head: atraumatic without any abnormalities. Eyes: conjunctivae/corneas clear. PERRL.  Sclera anicteric. Throat: lips, mucosa, and tongue normal; without oral thrush or ulcers. Resp: clear to auscultation bilaterally without rhonchi, wheezes or dullness to percussion. Cardio: regular rate and rhythm, S1, S2 normal, no murmur, click, rub or gallop GI: soft, non-tender; bowel sounds normal; no masses,  no organomegaly Skin: Erythema noted on her lower extremities bilaterally.  Chronic ulcers noted in the right and left lower extremity.  Lymphedema was noted. Lymph nodes: Cervical, supraclavicular, and axillary nodes  normal. Neurologic: Grossly normal without any motor, sensory or deep tendon reflexes. Musculoskeletal: No joint deformity or effusion.    Assessment and Plan:    88 year old with:  1.  Squamous cell carcinoma of the lower extremities diagnosed in June 2022.  She received Libtayo for locally advanced disease for total of 5 cycles concluded in September 2022.  She has not received any additional therapy at that time.  The natural course of this disease was reviewed at this time.  Risks and benefits of restarting therapy were discussed.  Complication associated with obtain including autoimmune complications including dermatitis, pneumonitis and thyroid disease.  The patient has follow-up with Dr. Ronnald Ramp for dermatological evaluation and possible biopsy.  If her disease is documented to have recurred she is agreeable to proceed with Libtayo.  Infusion of 350 mg every 3 weeks will be given for 5-6 cycles depending on her tolerance.  2.  Breast cancer: She was found to have invasive ductal carcinoma in 2017.  She underwent lumpectomy and adjuvant endocrine therapy concluded in 2022.   3.  Follow-up: Will be determined pending her dermatologic evaluation.    60 minutes were dedicated to this visit. The time was spent on reviewing pathology results, imaging studies, discussing treatment options, discussing differential diagnosis and answering questions regarding future plan.    A copy of this consult has been forwarded to the requesting physician.

## 2021-03-15 NOTE — Progress Notes (Signed)
ON PATHWAY REGIMEN - Melanoma and Other Skin Cancers  No Change  Continue With Treatment as Ordered.  Original Decision Date/Time: 06/18/2020 08:50     A cycle is every 21 days:     Cemiplimab-rwlc   **Always confirm dose/schedule in your pharmacy ordering system**  Patient Characteristics: Cutaneous Squamous Cell Carcinoma, Locally Advanced - Unresectable, Systemic Therapy Indicated Disease Classification: Cutaneous Squamous Cell Carcinoma Therapeutic Status: Locally Advanced - Unresectable Click here if multiple primary tumors are present.: false Intent of Therapy: Non-Curative / Palliative Intent, Discussed with Patient

## 2021-03-18 DIAGNOSIS — M79661 Pain in right lower leg: Secondary | ICD-10-CM | POA: Diagnosis not present

## 2021-03-18 DIAGNOSIS — Z85828 Personal history of other malignant neoplasm of skin: Secondary | ICD-10-CM | POA: Diagnosis not present

## 2021-03-18 DIAGNOSIS — L03115 Cellulitis of right lower limb: Secondary | ICD-10-CM | POA: Diagnosis not present

## 2021-03-18 DIAGNOSIS — L281 Prurigo nodularis: Secondary | ICD-10-CM | POA: Diagnosis not present

## 2021-03-18 NOTE — Progress Notes (Signed)
[name] ([id number]) , Visit Report for 03/14/2021 Arrival Information Details Patient Name: Date of Service: [name], [place]. 03/14/2021 1:15 PM Medical Record Number: [id number] Patient Account Number: [id number] Date of Birth/Sex: Treating RN: [birth year] (88 y.o. [name], [name] Primary Care Dravin Lance: [name] Other Clinician: Referring Landrie Beale: Treating Israel Wunder/Extender: [name] in Treatment: 15 Visit Information History Since Last Visit Added or deleted any medications: No Patient Arrived: Walker Any new allergies or adverse reactions: No Arrival Time: 13:08 Had a fall or experienced change in No Accompanied By: self activities of daily living that may affect Transfer Assistance: None risk of falls: Patient Identification Verified: Yes Signs or symptoms of abuse/neglect since last visito No Secondary Verification Process Completed: Yes Hospitalized since last visit: No Patient Requires Transmission-Based No Implantable device outside of the clinic excluding No Precautions: cellular tissue based products placed in the center Patient Has Alerts: Yes since last visit: Patient Alerts: R ABI = Non compressible Has Dressing in Place as Prescribed: Yes L ABI = Non compressible Has Compression in Place as Prescribed: Yes Pain Present Now: No Electronic Signature(s) Signed: 03/14/2021 5:46:52 PM By: Deon Pilling RN, BSN Entered By: Deon Pilling on 03/14/2021 13:12:27 -------------------------------------------------------------------------------- Encounter Discharge Information Details Patient Name: Date of Service: [name], [name] 03/14/2021 1:15 PM Medical Record Number: [id number] Patient Account Number: [id number] Date of Birth/Sex: Treating RN: [birth year] (88 y.o. [name] Primary Care Taliah Porche: [name] Other Clinician: Referring Dalten Ambrosino: Treating Alycia Cooperwood/Extender: [name] in Treatment: 63 Encounter Discharge Information Items Discharge Condition: Stable Ambulatory Status: Walker Discharge Destination: Home Transportation: Private Auto Accompanied By: self Schedule Follow-up Appointment: Yes Clinical Summary of Care: Electronic Signature(s) Signed: 03/14/2021 5:46:52 PM By: Deon Pilling RN, BSN Entered By: Deon Pilling on 03/14/2021 14:07:02 -------------------------------------------------------------------------------- Lower Extremity Assessment Details Patient Name: Date of Service: [name], [name] 03/14/2021 1:15 PM Medical Record Number: [id number] Patient Account Number: [id number] Date of Birth/Sex: Treating RN: January 17, 1926 (88 y.o. [name] Primary Care Perline Awe: [name] Other Clinician: Referring Anouk Critzer: Treating Aminat Shelburne/Extender: [name] in Treatment: 39 Edema Assessment Assessed: [Left: Yes] [Right: Yes] Edema: [Left: Yes] [Right: Yes] Calf Left: Right: Point of Measurement: 30 cm From Medial Instep 31.5 cm 29 cm Ankle Left: Right: Point of Measurement: 9 cm From Medial Instep 23 cm 21 cm Vascular Assessment Pulses: Dorsalis Pedis Palpable: [Left:Yes] [Right:Yes] Electronic Signature(s) Signed: 03/14/2021 5:46:52 PM By: Deon Pilling RN, BSN Entered By: Deon Pilling on 03/14/2021 13:13:14 -------------------------------------------------------------------------------- Multi Wound Chart Details Patient Name: Date of Service: [name], [name] 03/14/2021 1:15 PM Medical Record Number: [id number] Patient Account Number: [id number] Date of Birth/Sex: Treating RN: 10/30/1925 (88 y.o. F) Primary Care Breionna Punt: [name] Other Clinician: Referring Raghad Lorenz: Treating Alixandria Friedt/Extender: [name] in Treatment: 53 Vital Signs Height(in): 65 Pulse(bpm): 30 Weight(lbs): 163 Blood  Pressure(mmHg): 127/69 Body Mass Index(BMI): 27.1 Temperature(F): 97.7 Respiratory Rate(breaths/min): 20 Photos: [N/A:N/A] Right, Circumferential Lower Leg Left, Circumferential Lower Leg N/A Wound Location: Blister Blister N/A Wounding Event: Venous Leg Ulcer Malignant Wound N/A Primary Etiology: Anemia, Arrhythmia, Congestive Heart Anemia, Arrhythmia, Congestive Heart N/A Comorbid History: Failure, Hypertension, Colitis, Failure, Hypertension, Colitis, Osteoarthritis Osteoarthritis 02/06/2021 04/20/2020 N/A Date Acquired: 5 39 N/A Weeks of Treatment: Open Open N/A Wound Status: No Yes N/A Wound Recurrence: Yes Yes N/A Clustered Wound: 1.5x1.7x0.1 1.5x1.5x0.1 N/A Measurements L x W x D (cm) 2.003 1.767 N/A  A (cm) : rea 0.2 0.177 N/A Volume (cm) : 99.60% 42.50% N/A % Reduction in Area: 99.90% 42.30% N/A % Reduction in Volume: Full Thickness Without Exposed Full Thickness Without Exposed N/A Classification: Support Structures Support Structures Small Medium N/A Exudate Amount: Serosanguineous Serosanguineous N/A Exudate Type: red, brown red, brown N/A Exudate Color: Distinct, outline attached Distinct, outline attached N/A Wound Margin: Large (67-100%) Large (67-100%) N/A Granulation Amount: N/A Red N/A Granulation Quality: Small (1-33%) Small (1-33%) N/A Necrotic Amount: Fat Layer (Subcutaneous Tissue): Yes Fat Layer (Subcutaneous Tissue): Yes N/A Exposed Structures: Fascia: No Fascia: No Tendon: No Tendon: No Muscle: No Muscle: No Joint: No Joint: No Bone: No Bone: No Large (67-100%) Large (67-100%) N/A Epithelialization: Treatment Notes Electronic Signature(s) Signed: 03/14/2021 1:55:25 PM By: Kalman Shan DO Entered By: Kalman Shan on 03/14/2021 13:49:47 -------------------------------------------------------------------------------- Multi-Disciplinary Care Plan Details Patient Name: Date of Service: [name], [name] 03/14/2021  1:15 PM Medical Record Number: [id number] Patient Account Number: [id number] Date of Birth/Sex: Treating RN: 1925-03-03 (88 y.o. [name] Primary Care Aylyn Wenzler: [name] Other Clinician: Referring Yona Stansbury: Treating Adaly Puder/Extender: [name] in Treatment: 49 Multidisciplinary Care Plan reviewed with physician Active Inactive Venous Leg Ulcer Nursing Diagnoses: Knowledge deficit related to disease process and management Potential for venous Insuffiency (use before diagnosis confirmed) Goals: Patient will maintain optimal edema control Date Initiated: 11/01/2020 Target Resolution Date: 03/22/2021 Goal Status: Active Interventions: Assess peripheral edema status every visit. Compression as ordered Provide education on venous insufficiency Treatment Activities: Therapeutic compression applied : 11/01/2020 Notes: 12/27/20: Edema control ongoing 01/24/21: Edema control continues, using 3 layer compression Wound/Skin Impairment Nursing Diagnoses: Impaired tissue integrity Knowledge deficit related to ulceration/compromised skin integrity Goals: Patient/caregiver will verbalize understanding of skin care regimen Date Initiated: 06/11/2020 Target Resolution Date: 03/22/2021 Goal Status: Active Interventions: Assess patient/caregiver ability to obtain necessary supplies Assess patient/caregiver ability to perform ulcer/skin care regimen upon admission and as needed Assess ulceration(s) every visit Provide education on ulcer and skin care Notes: 10/11/20: Wound care regimen ongoing. 12/27/20: Wound care continues Electronic Signature(s) Signed: 03/14/2021 5:46:52 PM By: Deon Pilling RN, BSN Entered By: Deon Pilling on 03/14/2021 13:38:22 -------------------------------------------------------------------------------- Pain Assessment Details Patient Name: Date of Service: [name], Sherrie A. 03/14/2021 1:15 PM Medical Record Number:  [id number] Patient Account Number: [id number] Date of Birth/Sex: Treating RN: [birth year] (88 y.o. [name] Primary Care Jasamine Pottinger: [name] Other Clinician: Referring Genella Bas: Treating Jolan Mealor/Extender: [name] in Treatment: 58 Active Problems Location of Pain Severity and Description of Pain Patient Has Paino No Site Locations Rate the pain. Current Pain Level: 0 Pain Management and Medication Current Pain Management: Medication: No Cold Application: No Rest: No Massage: No Activity: No T.E.N.S.: No Heat Application: No Leg drop or elevation: No Is the Current Pain Management Adequate: Adequate How does your wound impact your activities of daily livingo Sleep: No Bathing: No Appetite: No Relationship With Others: No Bladder Continence: No Emotions: No Bowel Continence: No Work: No Toileting: No Drive: No Dressing: No Hobbies: No Engineer, maintenance) Signed: 03/14/2021 5:46:52 PM By: Deon Pilling RN, BSN Entered By: Deon Pilling on 03/14/2021 13:12:55 -------------------------------------------------------------------------------- Patient/Caregiver Education Details Patient Name: Date of Service: [name], Rilla A. 2/23/2023andnbsp1:15 PM Medical Record Number: [id number] Patient Account Number: [id number] Date of Birth/Gender: Treating RN: December 08, 1925 (88 y.o. [name] Primary Care Physician: [name] Other Clinician: Referring Physician: Treating Physician/Extender: [name] in Treatment: 32 Education  Assessment Education Provided To: Patient Education Topics Provided Wound/Skin Impairment: Handouts: Skin Care Do's and Dont's Methods: Explain/Verbal Responses: Reinforcements needed Electronic Signature(s) Signed: 03/14/2021 5:46:52 PM By: Deon Pilling RN, BSN Entered By: Deon Pilling on 03/14/2021  13:41:51 -------------------------------------------------------------------------------- Wound Assessment Details Patient Name: Date of Service: [name], [place]. 03/14/2021 1:15 PM Medical Record Number: [id number] Patient Account Number: [id number] Date of Birth/Sex: Treating RN: [birth year] (88 y.o. [name], [name] Primary Care Delmon Andrada: [name] Other Clinician: Referring Latrese Carolan: Treating Tynika Luddy/Extender: [name] in Treatment: 39 Wound Status Wound Number: 19 Primary Venous Leg Ulcer Etiology: Wound Location: Right, Circumferential Lower Leg Wound Open Wounding Event: Blister Status: Date Acquired: 02/06/2021 Comorbid Anemia, Arrhythmia, Congestive Heart Failure, Hypertension, Weeks Of Treatment: 5 History: Colitis, Osteoarthritis Clustered Wound: Yes Photos Wound Measurements Length: (cm) 1.5 Width: (cm) 1.7 Depth: (cm) 0.1 Area: (cm) 2.003 Volume: (cm) 0.2 % Reduction in Area: 99.6% % Reduction in Volume: 99.9% Epithelialization: Large (67-100%) Tunneling: No Undermining: No Wound Description Classification: Full Thickness Without Exposed Support Structures Wound Margin: Distinct, outline attached Exudate Amount: Small Exudate Type: Serosanguineous Exudate Color: red, brown Foul Odor After Cleansing: No Slough/Fibrino Yes Wound Bed Granulation Amount: Large (67-100%) Exposed Structure Necrotic Amount: Small (1-33%) Fascia Exposed: No Necrotic Quality: Adherent Slough Fat Layer (Subcutaneous Tissue) Exposed: Yes Tendon Exposed: No Muscle Exposed: No Joint Exposed: No Bone Exposed: No Treatment Notes Wound #19 (Lower Leg) Wound Laterality: Right, Circumferential Cleanser Soap and Water Discharge Instruction: May shower and wash wound with dial antibacterial soap and water prior to dressing change. Wound Cleanser Discharge Instruction: Cleanse the wound with wound cleanser prior to applying a clean  dressing using gauze sponges, not tissue or cotton balls. Peri-Wound Care Triamcinolone 15 (g) Discharge Instruction: In clinic only. Use triamcinolone 15 (g) in clinic only. Sween Lotion (Moisturizing lotion) Discharge Instruction: Apply moisturizing lotion as directed Topical Primary Dressing KerraCel Ag Gelling Fiber Dressing, 4x5 in (silver alginate) Discharge Instruction: Apply silver alginate to wound bed as instructed Secondary Dressing Woven Gauze Sponge, Non-Sterile 4x4 in Discharge Instruction: Apply over primary dressing as directed. Secured With Compression Wrap Kerlix Roll 4.5x3.1 (in/yd) Discharge Instruction: Apply Kerlix and Coban compression as directed. Compression Stockings Circaid Juxta Lite Compression Wrap Quantity: 1 Right Leg Compression Amount: 20-30 mmHg Discharge Instruction: Apply Circaid Juxta Lite Compression Wrap daily as instructed. Apply first thing in the morning, remove at night before bed. Add-Ons Electronic Signature(s) Signed: 03/14/2021 5:46:52 PM By: Deon Pilling RN, BSN Signed: 03/18/2021 11:32:19 AM By: Sharyn Creamer RN, BSN Entered By: Sharyn Creamer on 03/14/2021 13:15:34 -------------------------------------------------------------------------------- Wound Assessment Details Patient Name: Date of Service: [name], Paraskevi A. 03/14/2021 1:15 PM Medical Record Number: [id number] Patient Account Number: [id number] Date of Birth/Sex: Treating RN: January 04, 1926 (88 y.o. [name], [name] Primary Care Kiegan Macaraeg: [name] Other Clinician: Referring Wesam Gearhart: Treating Jerrik Housholder/Extender: [name] in Treatment: 39 Wound Status Wound Number: [id number] Primary Malignant Wound Etiology: Wound Location: Left, Circumferential Lower Leg Wound Open Wounding Event: Blister Status: Date Acquired: 04/20/2020 Comorbid Anemia, Arrhythmia, Congestive Heart Failure, Hypertension, Weeks Of Treatment: 39 History:  Colitis, Osteoarthritis Clustered Wound: Yes Photos Wound Measurements Length: (cm) 1.5 Width: (cm) 1.5 Depth: (cm) 0.1 Area: (cm) 1.767 Volume: (cm) 0.177 % Reduction in Area: 42.5% % Reduction in Volume: 42.3% Epithelialization: Large (67-100%) Tunneling: No Undermining: No Wound Description Classification: Full Thickness Without Exposed Support Structures Wound Margin: Distinct, outline attached Exudate Amount: Medium Exudate Type: Serosanguineous Exudate Color:  red, brown Foul Odor After Cleansing: No Slough/Fibrino Yes Wound Bed Granulation Amount: Large (67-100%) Exposed Structure Granulation Quality: Red Fascia Exposed: No Necrotic Amount: Small (1-33%) Fat Layer (Subcutaneous Tissue) Exposed: Yes Necrotic Quality: Adherent Slough Tendon Exposed: No Muscle Exposed: No Joint Exposed: No Bone Exposed: No Treatment Notes Wound #7R (Lower Leg) Wound Laterality: Left, Circumferential Cleanser Soap and Water Discharge Instruction: May shower and wash wound with dial antibacterial soap and water prior to dressing change. Wound Cleanser Discharge Instruction: Cleanse the wound with wound cleanser prior to applying a clean dressing using gauze sponges, not tissue or cotton balls. Peri-Wound Care Triamcinolone 15 (g) Discharge Instruction: In clinic only. Use triamcinolone 15 (g) in clinic only. Sween Lotion (Moisturizing lotion) Discharge Instruction: Apply moisturizing lotion as directed Topical Primary Dressing KerraCel Ag Gelling Fiber Dressing, 4x5 in (silver alginate) Discharge Instruction: Apply silver alginate to wound bed as instructed Secondary Dressing Woven Gauze Sponge, Non-Sterile 4x4 in Discharge Instruction: Apply over primary dressing as directed. ABD Pad, 8x10 Discharge Instruction: Apply over primary dressing as directed. Secured With Compression Wrap ThreePress (3 layer compression wrap) Discharge Instruction: Apply three layer compression  as directed. Compression Stockings Add-Ons Electronic Signature(s) Signed: 03/14/2021 5:46:52 PM By: Deon Pilling RN, BSN Signed: 03/18/2021 11:32:19 AM By: Sharyn Creamer RN, BSN Entered By: Sharyn Creamer on 03/14/2021 13:16:03 -------------------------------------------------------------------------------- Vitals Details Patient Name: Date of Service: [name], Caris A. 03/14/2021 1:15 PM Medical Record Number: [id number] Patient Account Number: [id number] Date of Birth/Sex: Treating RN: 1925-02-08 (88 y.o. [name], [name] Primary Care Yandel Zeiner: [name] Other Clinician: Referring Iness Pangilinan: Treating Maraki Macquarrie/Extender: [name] in Treatment: 8 Vital Signs Time Taken: 13:08 Temperature (F): 97.7 Height (in): 65 Pulse (bpm): 74 Weight (lbs): 163 Respiratory Rate (breaths/min): 20 Body Mass Index (BMI): 27.1 Blood Pressure (mmHg): 127/69 Reference Range: 80 - 120 mg / dl Electronic Signature(s) Signed: 03/14/2021 5:46:52 PM By: Deon Pilling RN, BSN Entered By: Deon Pilling on 03/14/2021 13:12:42

## 2021-03-19 DIAGNOSIS — S81809S Unspecified open wound, unspecified lower leg, sequela: Secondary | ICD-10-CM | POA: Diagnosis not present

## 2021-03-19 NOTE — Progress Notes (Signed)
Pharmacist Chemotherapy Monitoring - Initial Assessment   ? ?Anticipated start date: 03/26/21   ? ?The following has been reviewed per standard work regarding the patient's treatment regimen: ?The patient's diagnosis, treatment plan and drug doses, and organ/hematologic function ?Lab orders and baseline tests specific to treatment regimen  ?The treatment plan start date, drug sequencing, and pre-medications ?Prior authorization status  ?Patient's documented medication list, including drug-drug interaction screen and prescriptions for anti-emetics and supportive care specific to the treatment regimen ?The drug concentrations, fluid compatibility, administration routes, and timing of the medications to be used ?The patient's access for treatment and lifetime cumulative dose history, if applicable  ?The patient's medication allergies and previous infusion related reactions, if applicable  ? ?Changes made to treatment plan:  ?N/A ? ?Follow up needed:  ?Pending authorization for treatment  ? ? ?[facility], ?03/19/2021  2:49 PM ? ?

## 2021-03-21 ENCOUNTER — Telehealth: Payer: Self-pay

## 2021-03-21 ENCOUNTER — Encounter (HOSPITAL_BASED_OUTPATIENT_CLINIC_OR_DEPARTMENT_OTHER): Payer: Medicare Other | Attending: Internal Medicine | Admitting: Internal Medicine

## 2021-03-21 ENCOUNTER — Other Ambulatory Visit: Payer: Self-pay

## 2021-03-21 DIAGNOSIS — L97812 Non-pressure chronic ulcer of other part of right lower leg with fat layer exposed: Secondary | ICD-10-CM | POA: Insufficient documentation

## 2021-03-21 DIAGNOSIS — I872 Venous insufficiency (chronic) (peripheral): Secondary | ICD-10-CM | POA: Diagnosis not present

## 2021-03-21 DIAGNOSIS — I509 Heart failure, unspecified: Secondary | ICD-10-CM | POA: Diagnosis not present

## 2021-03-21 DIAGNOSIS — E039 Hypothyroidism, unspecified: Secondary | ICD-10-CM | POA: Insufficient documentation

## 2021-03-21 DIAGNOSIS — I110 Hypertensive heart disease with heart failure: Secondary | ICD-10-CM | POA: Diagnosis not present

## 2021-03-21 DIAGNOSIS — M1990 Unspecified osteoarthritis, unspecified site: Secondary | ICD-10-CM | POA: Diagnosis not present

## 2021-03-21 DIAGNOSIS — L97822 Non-pressure chronic ulcer of other part of left lower leg with fat layer exposed: Secondary | ICD-10-CM | POA: Insufficient documentation

## 2021-03-21 DIAGNOSIS — I87313 Chronic venous hypertension (idiopathic) with ulcer of bilateral lower extremity: Secondary | ICD-10-CM

## 2021-03-21 DIAGNOSIS — I890 Lymphedema, not elsewhere classified: Secondary | ICD-10-CM | POA: Diagnosis not present

## 2021-03-21 NOTE — Telephone Encounter (Signed)
Pt states she went to the wound center and they advised her that she should increase furosemide (LASIX) 40 MG tablet due pt still having swelling in Rt leg. ? ? Pharmacy: ?[facility], [place] ? ?LOV 02/05/21 ? ?Pt CB [phone] ?

## 2021-03-21 NOTE — Progress Notes (Signed)
[name] ([id number]) , Visit Report for 03/21/2021 Arrival Information Details Patient Name: Date of Service: [name], [place]. 03/21/2021 1:30 PM Medical Record Number: [id number] Patient Account Number: [id number] Date of Birth/Sex: Treating RN: 1925/02/11 (88 y.o. [name], [name] Primary Care Finnlee Guarnieri: [name] Other Clinician: Referring Elman Dettman: Treating Marquette Blodgett/Extender: [name] in Treatment: 70 Visit Information History Since Last Visit Added or deleted any medications: No Patient Arrived: Walker Any new allergies or adverse reactions: No Arrival Time: 13:27 Had a fall or experienced change in No Accompanied By: friends activities of daily living that may affect Transfer Assistance: Manual risk of falls: Patient Identification Verified: Yes Signs or symptoms of abuse/neglect since last visito No Secondary Verification Process Completed: Yes Hospitalized since last visit: No Patient Requires Transmission-Based No Implantable device outside of the clinic excluding No Precautions: cellular tissue based products placed in the center Patient Has Alerts: Yes since last visit: Patient Alerts: R ABI = Non compressible Has Dressing in Place as Prescribed: Yes L ABI = Non compressible Has Compression in Place as Prescribed: Yes Pain Present Now: No Electronic Signature(s) Signed: 03/21/2021 4:17:36 PM By: Rhae Hammock RN Entered By: Rhae Hammock on 03/21/2021 13:28:00 -------------------------------------------------------------------------------- Encounter Discharge Information Details Patient Name: Date of Service: [name], Kaly A. 03/21/2021 1:30 PM Medical Record Number: [id number] Patient Account Number: [id number] Date of Birth/Sex: Treating RN: 04/17/1925 (88 y.o. [name] Primary Care Tramar Brueckner: [name] Other Clinician: Referring Marcelles Clinard: Treating Eldwin Volkov/Extender: [name] in Treatment: 48 Encounter Discharge Information Items Discharge Condition: Stable Ambulatory Status: Walker Discharge Destination: Home Transportation: Private Auto Accompanied By: caregiver Schedule Follow-up Appointment: Yes Clinical Summary of Care: Electronic Signature(s) Signed: 03/21/2021 5:21:31 PM By: Deon Pilling RN, BSN Entered By: Deon Pilling on 03/21/2021 14:09:13 -------------------------------------------------------------------------------- Lower Extremity Assessment Details Patient Name: Date of Service: [name], [place]. 03/21/2021 1:30 PM Medical Record Number: [id number] Patient Account Number: [id number] Date of Birth/Sex: Treating RN: 08-07-1925 (88 y.o. [name], [name] Primary Care Kaizen Ibsen: [name] Other Clinician: Referring Missael Ferrari: Treating Daiquan Resnik/Extender: [name] in Treatment: 40 Edema Assessment Assessed: [Left: Yes] [Right: Yes] Edema: [Left: Yes] [Right: Yes] Calf Left: Right: Point of Measurement: 30 cm From Medial Instep 31.5 cm 43 cm Ankle Left: Right: Point of Measurement: 9 cm From Medial Instep 23 cm 26 cm Vascular Assessment Pulses: Dorsalis Pedis Palpable: [Left:Yes] [Right:Yes] Posterior Tibial Palpable: [Left:Yes] [Right:Yes] Electronic Signature(s) Signed: 03/21/2021 4:17:36 PM By: Rhae Hammock RN Entered By: Rhae Hammock on 03/21/2021 13:53:10 -------------------------------------------------------------------------------- Multi Wound Chart Details Patient Name: Date of Service: [name], [place]. 03/21/2021 1:30 PM Medical Record Number: [id number] Patient Account Number: [id number] Date of Birth/Sex: Treating RN: 14-Feb-1925 (88 y.o. [name], [name] Primary Care Willem Klingensmith: [name] Other Clinician: Referring Zoran Yankee: Treating Birdell Frasier/Extender: [name] in Treatment:  40 Vital Signs Height(in): 65 Pulse(bpm): 91 Weight(lbs): 163 Blood Pressure(mmHg): 131/78 Body Mass Index(BMI): 27.1 Temperature(F): 97.7 Respiratory Rate(breaths/min): 17 Photos: [19:Right, Circumferential Lower Leg] [7R:Left, Circumferential Lower Leg] [N/A:N/A N/A] Wound Location: [19:Blister] [7R:Blister] [N/A:N/A] Wounding Event: [19:Venous Leg Ulcer] [7R:Malignant Wound] [N/A:N/A] Primary Etiology: [19:Anemia, Arrhythmia, Congestive Heart Anemia, Arrhythmia, Congestive Heart N/A] Comorbid History: [19:Failure, Hypertension, Colitis, Osteoarthritis 02/06/2021] [7R:Failure, Hypertension, Colitis, Osteoarthritis 04/20/2020] [N/A:N/A] Date Acquired: [19:6] [7R:40] [N/A:N/A] Weeks of Treatment: [19:Open] [7R:Open] [N/A:N/A] Wound Status: [19:No] [7R:Yes] [N/A:N/A] Wound Recurrence: [19:Yes] [7R:Yes] [N/A:N/A] Clustered Wound: [19:20] [7R:N/A] [N/A:N/A] Clustered Quantity: [16:38G66Z9.9] [7R:0.1x0.1x0.2] [N/A:N/A]  Measurements L x W x D (cm) [09:3267.124] [7R:0.008] [N/A:N/A] A (cm) : rea [19:114.825] [7R:0.002] [N/A:N/A] Volume (cm) : [19:-126.20%] [7R:99.70%] [N/A:N/A] % Reduction in Area: [19:54.80%] [7R:99.30%] [N/A:N/A] % Reduction in Volume: [19:Full Thickness Without Exposed] [7R:Full Thickness Without Exposed] [N/A:N/A] Classification: [19:Support Structures Large] [7R:Support Structures Medium] [N/A:N/A] Exudate Amount: [19:Serosanguineous] [7R:Serosanguineous] [N/A:N/A] Exudate Type: [19:red, brown] [7R:red, brown] [N/A:N/A] Exudate Color: [19:Distinct, outline attached] [7R:Distinct, outline attached] [N/A:N/A] Wound Margin: [19:Large (67-100%)] [7R:Large (67-100%)] [N/A:N/A] Granulation Amount: [19:N/A] [7R:Red] [N/A:N/A] Granulation Quality: [19:Small (1-33%)] [7R:Small (1-33%)] [N/A:N/A] Necrotic Amount: [19:Fat Layer (Subcutaneous Tissue): Yes Fat Layer (Subcutaneous Tissue): Yes N/A] Exposed Structures: [19:Fascia: No Tendon: No Muscle: No Joint: No Bone: No  None] [7R:Fascia: No Tendon: No Muscle: No Joint: No Bone: No Large (67-100%)] [N/A:N/A] Treatment Notes Electronic Signature(s) Signed: 03/21/2021 2:15:07 PM By: Kalman Shan DO Signed: 03/21/2021 4:17:36 PM By: Rhae Hammock RN Entered By: Kalman Shan on 03/21/2021 14:08:34 -------------------------------------------------------------------------------- Multi-Disciplinary Care Plan Details Patient Name: Date of Service: [name], Ethel A. 03/21/2021 1:30 PM Medical Record Number: [id number] Patient Account Number: [id number] Date of Birth/Sex: Treating RN: 07/27/1925 (88 y.o. [name] Primary Care Andersen Mckiver: [name] Other Clinician: Referring Brookelin Felber: Treating Lindley Hiney/Extender: [name] in Treatment: 56 Multidisciplinary Care Plan reviewed with physician Active Inactive Venous Leg Ulcer Nursing Diagnoses: Knowledge deficit related to disease process and management Potential for venous Insuffiency (use before diagnosis confirmed) Goals: Patient will maintain optimal edema control Date Initiated: 11/01/2020 Target Resolution Date: 04/19/2021 Goal Status: Active Interventions: Assess peripheral edema status every visit. Compression as ordered Provide education on venous insufficiency Treatment Activities: Therapeutic compression applied : 11/01/2020 Notes: 12/27/20: Edema control ongoing 01/24/21: Edema control continues, using 3 layer compression Wound/Skin Impairment Nursing Diagnoses: Impaired tissue integrity Knowledge deficit related to ulceration/compromised skin integrity Goals: Patient/caregiver will verbalize understanding of skin care regimen Date Initiated: 06/11/2020 Target Resolution Date: 04/19/2021 Goal Status: Active Interventions: Assess patient/caregiver ability to obtain necessary supplies Assess patient/caregiver ability to perform ulcer/skin care regimen upon admission and as needed Assess  ulceration(s) every visit Provide education on ulcer and skin care Notes: 10/11/20: Wound care regimen ongoing. 12/27/20: Wound care continues Electronic Signature(s) Signed: 03/21/2021 5:21:31 PM By: Deon Pilling RN, BSN Entered By: Deon Pilling on 03/21/2021 14:08:02 -------------------------------------------------------------------------------- Pain Assessment Details Patient Name: Date of Service: [name], [place]. 03/21/2021 1:30 PM Medical Record Number: [id number] Patient Account Number: [id number] Date of Birth/Sex: Treating RN: Apr 16, 1925 (88 y.o. [name], [name] Primary Care Eon Zunker: [name] Other Clinician: Referring Shalonda Sachse: Treating Ithiel Liebler/Extender: [name] in Treatment: 40 Active Problems Location of Pain Severity and Description of Pain Patient Has Paino No Site Locations Pain Management and Medication Current Pain Management: Electronic Signature(s) Signed: 03/21/2021 4:17:36 PM By: Rhae Hammock RN Entered By: Rhae Hammock on 03/21/2021 13:53:38 -------------------------------------------------------------------------------- Patient/Caregiver Education Details Patient Name: Date of Service: [name], Amariyah A. 3/2/2023andnbsp1:30 PM Medical Record Number: [id number] Patient Account Number: [id number] Date of Birth/Gender: Treating RN: 04-Jun-1925 (88 y.o. [name] Primary Care Physician: [name] Other Clinician: Referring Physician: Treating Physician/Extender: [name] in Treatment: 84 Education Assessment Education Provided To: Patient Education Topics Provided Wound/Skin Impairment: Handouts: Skin Care Do's and Dont's Methods: Explain/Verbal Responses: Reinforcements needed Electronic Signature(s) Signed: 03/21/2021 5:21:31 PM By: Deon Pilling RN, BSN Entered By: Deon Pilling on 03/21/2021  14:08:17 -------------------------------------------------------------------------------- Wound Assessment Details Patient Name: Date of Service: [name], [place]. 03/21/2021 1:30 PM Medical Record  Number: [id number] Patient Account Number: [id number] Date of Birth/Sex: Treating RN: 11-Aug-1925 (88 y.o. [name] Primary Care Trajan Grove: [name] Other Clinician: Referring Westyn Driggers: Treating Kerisha Goughnour/Extender: [name] in Treatment: 40 Wound Status Wound Number: 19 Primary Venous Leg Ulcer Etiology: Wound Location: Right, Circumferential Lower Leg Wound Open Wounding Event: Blister Status: Date Acquired: 02/06/2021 Comorbid Anemia, Arrhythmia, Congestive Heart Failure, Hypertension, Weeks Of Treatment: 6 History: Colitis, Osteoarthritis Clustered Wound: Yes Photos Wound Measurements Length: (cm) 34 Width: (cm) 43 Depth: (cm) 0.1 Clustered Quantity: 20 Area: (cm) 1148.252 Volume: (cm) 114.825 % Reduction in Area: -126.2% % Reduction in Volume: 54.8% Epithelialization: None Tunneling: No Undermining: No Wound Description Classification: Full Thickness Without Exposed Support Structures Wound Margin: Distinct, outline attached Exudate Amount: Large Exudate Type: Serosanguineous Exudate Color: red, brown Foul Odor After Cleansing: No Slough/Fibrino Yes Wound Bed Granulation Amount: Large (67-100%) Exposed Structure Necrotic Amount: Small (1-33%) Fascia Exposed: No Necrotic Quality: Adherent Slough Fat Layer (Subcutaneous Tissue) Exposed: Yes Tendon Exposed: No Muscle Exposed: No Joint Exposed: No Bone Exposed: No Treatment Notes Wound #19 (Lower Leg) Wound Laterality: Right, Circumferential Cleanser Soap and Water Discharge Instruction: May shower and wash wound with dial antibacterial soap and water prior to dressing change. Wound Cleanser Discharge Instruction: Cleanse the wound with wound cleanser prior to  applying a clean dressing using gauze sponges, not tissue or cotton balls. Peri-Wound Care Triamcinolone 15 (g) Discharge Instruction: In clinic only. Use triamcinolone 15 (g) in clinic only. Zinc Oxide Ointment 30g tube Discharge Instruction: Apply Zinc Oxide to periwound with each dressing change Sween Lotion (Moisturizing lotion) Discharge Instruction: Apply moisturizing lotion as directed Topical Primary Dressing KerraCel Ag Gelling Fiber Dressing, 4x5 in (silver alginate) Discharge Instruction: Apply silver alginate to wound bed as instructed Secondary Dressing Woven Gauze Sponge, Non-Sterile 4x4 in Discharge Instruction: Apply over primary dressing as directed. ABD Pad, 8x10 Discharge Instruction: Apply over primary dressing as directed. Zetuvit Plus 4x8 in Discharge Instruction: Apply over primary dressing as directed. Secured With Compression Wrap ThreePress (3 layer compression wrap) Discharge Instruction: Apply three layer compression as directed. Compression Stockings Add-Ons Electronic Signature(s) Signed: 03/21/2021 5:21:31 PM By: Deon Pilling RN, BSN Entered By: Deon Pilling on 03/21/2021 13:53:14 -------------------------------------------------------------------------------- Wound Assessment Details Patient Name: Date of Service: [name], [place]. 03/21/2021 1:30 PM Medical Record Number: [id number] Patient Account Number: [id number] Date of Birth/Sex: Treating RN: 1925-09-21 (88 y.o. [name], [name] Primary Care Nohely Whitehorn: [name] Other Clinician: Referring Hatsumi Steinhart: Treating Carollynn Pennywell/Extender: [name] in Treatment: 40 Wound Status Wound Number: 7R Primary Malignant Wound Etiology: Wound Location: Left, Circumferential Lower Leg Wound Open Wounding Event: Blister Status: Date Acquired: 04/20/2020 Comorbid Anemia, Arrhythmia, Congestive Heart Failure, Hypertension, Weeks Of Treatment: 40 History: Colitis,  Osteoarthritis Clustered Wound: Yes Photos Wound Measurements Length: (cm) 0.1 Width: (cm) 0.1 Depth: (cm) 0.2 Area: (cm) 0.008 Volume: (cm) 0.002 % Reduction in Area: 99.7% % Reduction in Volume: 99.3% Epithelialization: Large (67-100%) Tunneling: No Undermining: No Wound Description Classification: Full Thickness Without Exposed Support Structures Wound Margin: Distinct, outline attached Exudate Amount: Medium Exudate Type: Serosanguineous Exudate Color: red, brown Foul Odor After Cleansing: No Slough/Fibrino Yes Wound Bed Granulation Amount: Large (67-100%) Exposed Structure Granulation Quality: Red Fascia Exposed: No Necrotic Amount: Small (1-33%) Fat Layer (Subcutaneous Tissue) Exposed: Yes Necrotic Quality: Adherent Slough Tendon Exposed: No Muscle Exposed: No Joint Exposed: No Bone Exposed: No Treatment Notes Wound #7R (Lower Leg) Wound Laterality: Left, Circumferential Cleanser Soap  and Water Discharge Instruction: May shower and wash wound with dial antibacterial soap and water prior to dressing change. Wound Cleanser Discharge Instruction: Cleanse the wound with wound cleanser prior to applying a clean dressing using gauze sponges, not tissue or cotton balls. Peri-Wound Care Triamcinolone 15 (g) Discharge Instruction: In clinic only. Use triamcinolone 15 (g) in clinic only. Sween Lotion (Moisturizing lotion) Discharge Instruction: Apply moisturizing lotion as directed Topical Primary Dressing KerraCel Ag Gelling Fiber Dressing, 4x5 in (silver alginate) Discharge Instruction: Apply silver alginate to wound bed as instructed Secondary Dressing Woven Gauze Sponge, Non-Sterile 4x4 in Discharge Instruction: Apply over primary dressing as directed. ABD Pad, 8x10 Discharge Instruction: Apply over primary dressing as directed. Secured With Compression Wrap ThreePress (3 layer compression wrap) Discharge Instruction: Apply three layer compression as  directed. Compression Stockings Add-Ons Electronic Signature(s) Signed: 03/21/2021 5:21:31 PM By: Deon Pilling RN, BSN Entered By: Deon Pilling on 03/21/2021 13:54:03 -------------------------------------------------------------------------------- Vitals Details Patient Name: Date of Service: [name], [place]. 03/21/2021 1:30 PM Medical Record Number: [id number] Patient Account Number: [id number] Date of Birth/Sex: Treating RN: [birth year] (88 y.o. [name], [name] Primary Care Kamari Buch: [name] Other Clinician: Referring Shanitha Twining: Treating Neshia Mckenzie/Extender: [name] in Treatment: 40 Vital Signs Time Taken: 13:28 Temperature (F): 97.7 Height (in): 65 Pulse (bpm): 91 Weight (lbs): 163 Respiratory Rate (breaths/min): 17 Body Mass Index (BMI): 27.1 Blood Pressure (mmHg): 131/78 Reference Range: 80 - 120 mg / dl Electronic Signature(s) Signed: 03/21/2021 4:17:36 PM By: Rhae Hammock RN Entered By: Rhae Hammock on 03/21/2021 13:29:50

## 2021-03-21 NOTE — Progress Notes (Addendum)
[name] ([id number]) , Visit Report for 03/21/2021 Chief Complaint Document Details Patient Name: Date of Service: [name], [place]. 03/21/2021 1:30 PM Medical Record Number: [id number] Patient Account Number: [id number] Date of Birth/Sex: Treating RN: [birth year] (88 y.o. [name], [name] Primary Care Provider: [name] Other Clinician: Referring Provider: Treating Provider/Extender: [name] in Treatment: 62 Information Obtained from: Patient Chief Complaint Bilateral lower extremity wounds that have been biopsied and positive for squamous cell carcinoma Bilateral lower extremity wounds due to chronic venous insufficiency/lymphedema Electronic Signature(s) Signed: 03/21/2021 2:15:07 PM By: Kalman Shan DO Entered By: Kalman Shan on 03/21/2021 14:08:57 -------------------------------------------------------------------------------- HPI Details Patient Name: Date of Service: [name], [name] 03/21/2021 1:30 PM Medical Record Number: [id number] Patient Account Number: [id number] Date of Birth/Sex: Treating RN: 1925/12/13 (88 y.o. [name] Primary Care Provider: [name] Other Clinician: Referring Provider: Treating Provider/Extender: [name] in Treatment: 40 History of Present Illness Location: left leg HPI Description: Admission 5/23 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 She states that the wound on the right has been present for a year. The 1 on the left has opened up 1 month ago. She is followed with oncology for this issue as she had biopsies that showed squamous cell carcinoma. She is also seeing radiation oncology for  treatment options. She presents today because she would like for her wounds to be healed by [place]. She currently denies signs of infection. 6/1; patient presents for 1 week follow-up. She states she has tolerated the leg wraps well. She states these do not bother her and is happy to continue with them. She is scheduled to see her oncologist today to go over treatment options for the bilateral lower extremity squamous cell carcinoma. Radiation is currently not a recommended option. Patient states she overall feels well. 6/22; patient presents for 3-week follow-up. She has tolerated the wraps well until her last wrap where she states they were uncomfortable. She attributes this to the home health nurse. She denies signs of infection. She has started her first treatment of antibody infusions for her Bilateral lower extremity squamous cell carcinoma. She has no complaints today. 7/21; patient presents for 1 month follow-up. Unfortunately she has not had good experience with her wrap changes with home health. She would like to do her own dressing changes. She continues to do her antibody infusions. She denies signs of infection. 7/28; patient presents for 1 week follow-up. At last clinic visit she was switched to daily dressing changes due to issues with the wrap and home health placing them. Unfortunately she has developed weeping to her legs bilaterally. She would like to be placed in wraps today. She would also like to follow with [place] weekly for wrap changes instead of having home health change them. She denies signs of infection. 8/4; patient presents for 1 week follow-up. She has tolerated the Kerlix/Coban wraps well. She no longer has weeping to her legs. She took the wrap off 1 day before coming in to be able to take a shower. She has no issues or complaints today. She denies signs of infection. 8/18; patient presents for follow-up. Patient has tolerated the wraps well. She brought her Velcro  compression wraps today. She has no  issues or complaints today. She had her chemotherapy infusion yesterday without issues. She denies signs of infection. 8/25; patient presents for follow-up. She used her juxta lite compressions for the past week. It is unclear if she is able to put these on correctly since she states she has a hard time getting them to look right. She reports 2 open wounds. She currently denies signs of infection. 9/1; patient presents for 1 week follow-up. She has 1 open wound. She tolerated the compression wraps well. She currently denies signs of infection. 9/8; patient presents for 1 week follow-up. She has 2 open wounds 1 on each leg. She has tolerated the compression wrap well. She currently denies signs of infection. 9/15; patient presents for 1 week follow-up. She now has 3 wounds. 2 on the left and 1 on the right. She continues to tolerate the compression wrap well. She currently denies signs of infection. She has obtained furosemide by her primary care physician and would like to discuss when to take this. 9/22; patient presents for 1 week follow-up. She has scattered wounds on her lower extremities bilaterally. She did take furosemide twice in the past week. She does not recall having to urinate more frequently. She tolerated the 3 layer compression well. She denies signs of infection. 10/13; patient has not been here recently because of [place]. Apparently the facility was only putting gauze on her legs. This is a patient I do not normally see. 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 Ronnald Ramp at [facility] dermatology. She is quite convincing that she did not have radiation to her lower legs. It is likely she also has significant chronic venous insufficiency stasis dermatitis. We have been using silver alginate under kerlix Coban. She has obvious open areas medially on the right and areas on the  left. She also has areas of extensive dry flaking adherent areas on the right and to a lesser extent on the left anterior. Nodular areas on the left lateral lower leg left posterior calf and right mid calf medially. 10/21; patient presents for follow-up. She has no issues or complaints today. She has tolerated the 3 layer compression well bilaterally. She denies signs of infection. 10/27; patient presents for follow-up. She continues to tolerate the 3 layer compression wrap well. She denies signs of infection. 11/30; patient presents for follow-up. She has no issues or complaints today. 11/10; patient arrived in clinic today for nurse visit accompanied by her daughter from [place]. The daughter had multiple questions so we turned this into doctors visit. Apparently after the last time I saw this woman in October she went to see Dr. Ronnald Ramp but he did not take the wraps off. She previously was treated with Cemiplimab for her squamous cell carcinoma. She did not receive radiation to her legs. I had wanted Dr. Ronnald Ramp to look at this because of the exceptionally damaged skin on her lower legs bilaterally. She has odd looking wounds on the left medial lower leg also extending posteriorly which we have not made a lot of progress on. But she also has a raised hyperkeratotic nodules on the right anterior tibial area plaques on the left medial lower thigh. These are not areas that are under compression. We have been using silver alginate on any open areas Liberal TCA under 3 layer compression. 11/17; patient presents for follow-up. She had her wraps taken off yesterday for her dermatology appointment. She reports excessive  weeping to her legs bilaterally after the wraps were taken off. She currently denies signs of infection. 12/12/2020 upon evaluation today patient appears to be doing about the same in regard to her wounds. She was actually started on Cipro after having biopsies apparently at her dermatology  clinic she does not have the results back from the actual biopsy but the culture did return and apparently they placed her on the Cipro she started that just this morning. Other than that her legs appear to be doing okay at this time. 12/1; patient presents for follow-up. She has no issues or complaints today. She has started Lasix 40 mg daily to help with her bilateral leg swelling. 12/8; patient presents for follow-up. She has no issues or complaints today. 12/15; patient presents for 1 week follow-up. She has no issues or complaints today. 12/22; patient presents for follow-up. She has no issues or complaints today. 1/5; patient presents for follow-up. She has no issues or complaints today. She has tolerated the compression wraps well. 1/12; patient presents for follow-up. She is tolerated the compression wrap well and has no issues or complaints today. She denies signs of infection. 1/19; patient presents for follow-up. She took the compression wraps off 1 to 2 days ago to take a shower and did not have compression wraps replaced. She reports increased blistering throughout her legs bilaterally. She currently denies signs of infection. 1/26; patient presents for follow-up. She has no issues or complaints today. She tolerated the compression wraps well. She has not heard from oncology to schedule an appointment. 2/2; patient presents for follow-up. She has no issues or complaints today. She continues to tolerate the compression wrap well. 2/16; patient presents for follow-up. She has no issues or complaints today. 2/23; patient presents for follow-up. She has no issues or complaints today. Tomorrow she has an appointment with oncology to look at the suspicious lesion on her right lower extremity. She currently denies signs of infection. 3/2; patient presents for follow-up. She has been using her juxta light compression to the right lower extremity however there has been increased swelling  and opening of wounds throughout the legs with weeping. She currently denies signs of infection. She had no issues with the compression wrap to the left lower extremity. Electronic Signature(s) Signed: 03/21/2021 2:15:07 PM By: Kalman Shan DO Entered By: Kalman Shan on 03/21/2021 14:09:38 -------------------------------------------------------------------------------- Physical Exam Details Patient Name: Date of Service: [name], [name]. 03/21/2021 1:30 PM Medical Record Number: [id number] Patient Account Number: [id number] Date of Birth/Sex: Treating RN: [birth year] (88 y.o. [name] Primary Care Provider: Other Clinician: [name] Referring Provider: Treating Provider/Extender: [name] in Treatment: 40 Constitutional respirations regular, non-labored and within target range for patient.. Cardiovascular 2+ dorsalis pedis/posterior tibialis pulses. Psychiatric pleasant and cooperative. Notes 2 wounds present to the left lower extremity with granulation tissue present. There is a suspicious lesion to the right anterior leg where she previously had squamous cell carcinoma. On the right lower extremity she has multiple scattered open wounds with granulation tissue and weeping. No surrounding signs of infection. Electronic Signature(s) Signed: 03/21/2021 2:15:07 PM By: Kalman Shan DO Entered By: Kalman Shan on 03/21/2021 14:10:22 -------------------------------------------------------------------------------- Physician Orders Details Patient Name: Date of Service: [name], [place]. 03/21/2021 1:30 PM Medical Record Number: [id number] Patient Account Number: [id number] Date of Birth/Sex: Treating RN: 04-14-1925 (88 y.o. [name] Primary Care Provider: [name] Other Clinician: Referring Provider: Treating Provider/Extender: Kalman Shan [name],  [name] Weeks in Treatment: 28 Verbal /  Phone Orders: No Diagnosis Coding ICD-10 Coding Code Description C44.92 Squamous cell carcinoma of skin, unspecified L97.812 Non-pressure chronic ulcer of other part of right lower leg with fat layer exposed L97.822 Non-pressure chronic ulcer of other part of left lower leg with fat layer exposed I87.2 Venous insufficiency (chronic) (peripheral) I89.0 Lymphedema, not elsewhere classified Follow-up Appointments ppointment in 1 week. - Dr. Heber Ghent and [name], Room 8 Return A Other: - ****Someone will call you with you appt time. Venous studies test at [facility]; if scheduled before you come back call wound center to let nurse know when appt is so we can schedule a nurse visit appt to rewrap after the tests- schedule a morning appt time with them if possible. ****Ordering Lymphedema pumps to use a home.**** Bathing/ Shower/ Hygiene May shower with protection but do not get wound dressing(s) wet. - Use cast protector Edema Control - Lymphedema / SCD / Other Lymphedema Pumps. Use Lymphedema pumps on leg(s) 2-3 times a day for 45-60 minutes. If wearing any wraps or hose, do not remove them. Continue exercising as instructed. - wound center to order. Elevate legs to the level of the heart or above for 30 minutes daily and/or when sitting, a frequency of: - elevate the legs throughout the day heart level if possible. Avoid standing for long periods of time. Exercise regularly Additional Orders / Instructions Follow Nutritious Diet Wound Treatment Wound #19 - Lower Leg Wound Laterality: Right, Circumferential Cleanser: Soap and Water 1 x Per Week/15 Days Discharge Instructions: May shower and wash wound with dial antibacterial soap and water prior to dressing change. Cleanser: Wound Cleanser 1 x Per Week/15 Days Discharge Instructions: Cleanse the wound with wound cleanser prior to applying a clean dressing using gauze sponges, not tissue or cotton balls. Peri-Wound Care: Triamcinolone  15 (g) 1 x Per Week/15 Days Discharge Instructions: In clinic only. Use triamcinolone 15 (g) in clinic only. Peri-Wound Care: Zinc Oxide Ointment 30g tube 1 x Per Week/15 Days Discharge Instructions: Apply Zinc Oxide to periwound with each dressing change Peri-Wound Care: Sween Lotion (Moisturizing lotion) 1 x Per Week/15 Days Discharge Instructions: Apply moisturizing lotion as directed Prim Dressing: KerraCel Ag Gelling Fiber Dressing, 4x5 in (silver alginate) 1 x Per Week/15 Days ary Discharge Instructions: Apply silver alginate to wound bed as instructed Secondary Dressing: Woven Gauze Sponge, Non-Sterile 4x4 in 1 x Per Week/15 Days Discharge Instructions: Apply over primary dressing as directed. Secondary Dressing: ABD Pad, 8x10 1 x Per Week/15 Days Discharge Instructions: Apply over primary dressing as directed. Secondary Dressing: Zetuvit Plus 4x8 in 1 x Per Week/15 Days Discharge Instructions: Apply over primary dressing as directed. Compression Wrap: ThreePress (3 layer compression wrap) 1 x Per Week/15 Days Discharge Instructions: Apply three layer compression as directed. Wound #7R - Lower Leg Wound Laterality: Left, Circumferential Cleanser: Soap and Water 1 x Per Week/15 Days Discharge Instructions: May shower and wash wound with dial antibacterial soap and water prior to dressing change. Cleanser: Wound Cleanser 1 x Per Week/15 Days Discharge Instructions: Cleanse the wound with wound cleanser prior to applying a clean dressing using gauze sponges, not tissue or cotton balls. Peri-Wound Care: Triamcinolone 15 (g) 1 x Per Week/15 Days Discharge Instructions: In clinic only. Use triamcinolone 15 (g) in clinic only. Peri-Wound Care: Sween Lotion (Moisturizing lotion) 1 x Per Week/15 Days Discharge Instructions: Apply moisturizing lotion as directed Prim Dressing: KerraCel Ag Gelling Fiber Dressing, 4x5 in (silver alginate)  1 x Per Week/15 Days ary Discharge Instructions: Apply  silver alginate to wound bed as instructed Secondary Dressing: Woven Gauze Sponge, Non-Sterile 4x4 in 1 x Per Week/15 Days Discharge Instructions: Apply over primary dressing as directed. Secondary Dressing: ABD Pad, 8x10 1 x Per Week/15 Days Discharge Instructions: Apply over primary dressing as directed. Compression Wrap: ThreePress (3 layer compression wrap) 1 x Per Week/15 Days Discharge Instructions: Apply three layer compression as directed. Services and Therapies Venous Studies -Bilateral - [facility] venous reflux studies both legs. - (ICD10 I87.2 - Venous insufficiency (chronic) (peripheral)) Electronic Signature(s) Signed: 03/21/2021 5:21:31 PM By: Deon Pilling RN, BSN Signed: 03/22/2021 10:01:33 AM By: Kalman Shan DO Previous Signature: 03/21/2021 2:15:07 PM Version By: Kalman Shan DO Entered By: Deon Pilling on 03/21/2021 16:55:14 -------------------------------------------------------------------------------- Problem List Details Patient Name: Date of Service: [name], [place]. 03/21/2021 1:30 PM Medical Record Number: [id number] Patient Account Number: [id number] Date of Birth/Sex: Treating RN: 09-Jul-1925 (88 y.o. [name], [name] Primary Care Provider: Other Clinician: [name] Referring Provider: Treating Provider/Extender: [name] in Treatment: 13 Active Problems ICD-10 Encounter Code Description Active Date MDM Diagnosis C44.92 Squamous cell carcinoma of skin, unspecified 06/11/2020 No Yes L97.812 Non-pressure chronic ulcer of other part of right lower leg with fat layer 06/11/2020 No Yes exposed L97.822 Non-pressure chronic ulcer of other part of left lower leg with fat layer exposed5/23/2022 No Yes I89.0 Lymphedema, not elsewhere classified 01/31/2021 No Yes I87.313 Chronic venous hypertension (idiopathic) with ulcer of bilateral lower extremity 03/21/2021 No Yes Inactive Problems Resolved  Problems Electronic Signature(s) Signed: 03/21/2021 2:15:07 PM By: Kalman Shan DO Entered By: Kalman Shan on 03/21/2021 14:08:21 -------------------------------------------------------------------------------- Progress Note Details Patient Name: Date of Service: [name], [place] A. 03/21/2021 1:30 PM Medical Record Number: [id number] Patient Account Number: [id number] Date of Birth/Sex: Treating RN: [birth year] (88 y.o. [name], [name] Primary Care Provider: [name] Other Clinician: Referring Provider: Treating Provider/Extender: [name] in Treatment: 40 Subjective Chief Complaint Information obtained from Patient Bilateral lower extremity wounds that have been biopsied and positive for squamous cell carcinoma Bilateral lower extremity wounds due to chronic venous insufficiency/lymphedema History of Present Illness (HPI) The following HPI elements were documented for the patient's wound: Location: left leg Admission 5/23 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 She states that the wound on the right has been present for a year. The 1 on the left has opened up 1 month ago. She is followed with oncology for this issue as she had biopsies that showed squamous cell carcinoma. She is also seeing radiation oncology for treatment options. She presents today because she would like for her wounds to be healed by [place]. She currently denies signs of infection. 6/1; patient presents for 1 week follow-up. She states she has tolerated the leg wraps well. She states these do not bother her and is happy to continue with them. She is scheduled to see her oncologist today to go over treatment options for the bilateral  lower extremity squamous cell carcinoma. Radiation is currently not a recommended option. Patient states she overall feels well. 6/22; patient presents for 3-week follow-up. She has tolerated the wraps well until her last wrap where she states they were uncomfortable. She attributes  this to the home health nurse. She denies signs of infection. She has started her first treatment of antibody infusions for her Bilateral lower extremity squamous cell carcinoma. She has no complaints today. 7/21; patient presents for 1 month follow-up. Unfortunately she has not had good experience with her wrap changes with home health. She would like to do her own dressing changes. She continues to do her antibody infusions. She denies signs of infection. 7/28; patient presents for 1 week follow-up. At last clinic visit she was switched to daily dressing changes due to issues with the wrap and home health placing them. Unfortunately she has developed weeping to her legs bilaterally. She would like to be placed in wraps today. She would also like to follow with [place] weekly for wrap changes instead of having home health change them. She denies signs of infection. 8/4; patient presents for 1 week follow-up. She has tolerated the Kerlix/Coban wraps well. She no longer has weeping to her legs. She took the wrap off 1 day before coming in to be able to take a shower. She has no issues or complaints today. She denies signs of infection. 8/18; patient presents for follow-up. Patient has tolerated the wraps well. She brought her Velcro compression wraps today. She has no issues or complaints today. She had her chemotherapy infusion yesterday without issues. She denies signs of infection. 8/25; patient presents for follow-up. She used her juxta lite compressions for the past week. It is unclear if she is able to put these on correctly since she states she has a hard time getting them to look right. She reports 2 open wounds. She  currently denies signs of infection. 9/1; patient presents for 1 week follow-up. She has 1 open wound. She tolerated the compression wraps well. She currently denies signs of infection. 9/8; patient presents for 1 week follow-up. She has 2 open wounds 1 on each leg. She has tolerated the compression wrap well. She currently denies signs of infection. 9/15; patient presents for 1 week follow-up. She now has 3 wounds. 2 on the left and 1 on the right. She continues to tolerate the compression wrap well. She currently denies signs of infection. She has obtained furosemide by her primary care physician and would like to discuss when to take this. 9/22; patient presents for 1 week follow-up. She has scattered wounds on her lower extremities bilaterally. She did take furosemide twice in the past week. She does not recall having to urinate more frequently. She tolerated the 3 layer compression well. She denies signs of infection. 10/13; patient has not been here recently because of [place]. Apparently the facility was only putting gauze on her legs. This is a patient I do not normally see. 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 Ronnald Ramp at [facility] dermatology. She is quite convincing that she did not have radiation to her lower legs. It is likely she also has significant chronic venous insufficiency stasis dermatitis. We have been using silver alginate under kerlix Coban. She has obvious open areas medially on the right and areas on the left. She also has areas of extensive dry flaking adherent areas on the right and to a lesser extent on the left anterior. Nodular areas on the left lateral lower leg left posterior calf and right mid calf medially. 10/21; patient presents for follow-up. She has no issues or complaints today. She has tolerated the 3 layer compression  well bilaterally. She denies signs of infection. 10/27; patient  presents for follow-up. She continues to tolerate the 3 layer compression wrap well. She denies signs of infection. 11/30; patient presents for follow-up. She has no issues or complaints today. 11/10; patient arrived in clinic today for nurse visit accompanied by her daughter from [place]. The daughter had multiple questions so we turned this into doctors visit. Apparently after the last time I saw this woman in October she went to see Dr. Ronnald Ramp but he did not take the wraps off. She previously was treated with Cemiplimab for her squamous cell carcinoma. She did not receive radiation to her legs. I had wanted Dr. Ronnald Ramp to look at this because of the exceptionally damaged skin on her lower legs bilaterally. She has odd looking wounds on the left medial lower leg also extending posteriorly which we have not made a lot of progress on. But she also has a raised hyperkeratotic nodules on the right anterior tibial area plaques on the left medial lower thigh. These are not areas that are under compression. We have been using silver alginate on any open areas Liberal TCA under 3 layer compression. 11/17; patient presents for follow-up. She had her wraps taken off yesterday for her dermatology appointment. She reports excessive weeping to her legs bilaterally after the wraps were taken off. She currently denies signs of infection. 12/12/2020 upon evaluation today patient appears to be doing about the same in regard to her wounds. She was actually started on Cipro after having biopsies apparently at her dermatology clinic she does not have the results back from the actual biopsy but the culture did return and apparently they placed her on the Cipro she started that just this morning. Other than that her legs appear to be doing okay at this time. 12/1; patient presents for follow-up. She has no issues or complaints today. She has started Lasix 40 mg daily to help with her bilateral leg swelling. 12/8; patient  presents for follow-up. She has no issues or complaints today. 12/15; patient presents for 1 week follow-up. She has no issues or complaints today. 12/22; patient presents for follow-up. She has no issues or complaints today. 1/5; patient presents for follow-up. She has no issues or complaints today. She has tolerated the compression wraps well. 1/12; patient presents for follow-up. She is tolerated the compression wrap well and has no issues or complaints today. She denies signs of infection. 1/19; patient presents for follow-up. She took the compression wraps off 1 to 2 days ago to take a shower and did not have compression wraps replaced. She reports increased blistering throughout her legs bilaterally. She currently denies signs of infection. 1/26; patient presents for follow-up. She has no issues or complaints today. She tolerated the compression wraps well. She has not heard from oncology to schedule an appointment. 2/2; patient presents for follow-up. She has no issues or complaints today. She continues to tolerate the compression wrap well. 2/16; patient presents for follow-up. She has no issues or complaints today. 2/23; patient presents for follow-up. She has no issues or complaints today. Tomorrow she has an appointment with oncology to look at the suspicious lesion on her right lower extremity. She currently denies signs of infection. 3/2; patient presents for follow-up. She has been using her juxta light compression to the right lower extremity however there has been increased swelling and opening of wounds throughout the legs with weeping. She currently denies signs of infection. She had no issues with the  compression wrap to the left lower extremity. Patient History Information obtained from Patient. Family History Unknown History. Social History Never smoker, Marital Status - Single, Alcohol Use - Never, Drug Use - No History, Caffeine Use - Never. Medical History Eyes Denies  history of Cataracts, Optic Neuritis Ear/Nose/Mouth/Throat Denies history of Chronic sinus problems/congestion, Middle ear problems Hematologic/Lymphatic Patient has history of Anemia Denies history of Hemophilia, Human Immunodeficiency Virus, Lymphedema, Sickle Cell Disease Respiratory Denies history of Aspiration, Asthma, Chronic Obstructive Pulmonary Disease (COPD), Pneumothorax, Sleep Apnea, Tuberculosis Cardiovascular Patient has history of Arrhythmia - Atrial Flutter, A fibb, Congestive Heart Failure, Hypertension Denies history of Angina, Coronary Artery Disease, Deep Vein Thrombosis, Hypotension, Myocardial Infarction, Peripheral Arterial Disease, Peripheral Venous Disease, Phlebitis, Vasculitis Gastrointestinal Patient has history of Colitis Denies history of Cirrhosis , Crohnoos, Hepatitis A, Hepatitis B, Hepatitis C Endocrine Denies history of Type I Diabetes, Type II Diabetes Genitourinary Denies history of End Stage Renal Disease Immunological Denies history of Lupus Erythematosus, Raynaudoos, Scleroderma Integumentary (Skin) Denies history of History of Burn Musculoskeletal Patient has history of Osteoarthritis Denies history of Gout, Rheumatoid Arthritis, Osteomyelitis Neurologic Denies history of Dementia, Neuropathy, Quadriplegia, Paraplegia, Seizure Disorder Oncologic Denies history of Received Chemotherapy, Received Radiation Hospitalization/Surgery History - removal of rod left leg. 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 Vitals Time Taken: 1:28 PM, Height: 65 in, Weight: 163 lbs, BMI: 27.1, Temperature: 97.7 F, Pulse: 91 bpm, Respiratory Rate: 17 breaths/min, Blood Pressure: 131/78 mmHg. Cardiovascular 2+ dorsalis pedis/posterior  tibialis pulses. Psychiatric pleasant and cooperative. General Notes: 2 wounds present to the left lower extremity with granulation tissue present. There is a suspicious lesion to the right anterior leg where she previously had squamous cell carcinoma. On the right lower extremity she has multiple scattered open wounds with granulation tissue and weeping. No surrounding signs of infection. Integumentary (Hair, Skin) Wound #19 status is Open. Original cause of wound was Blister. The date acquired was: 02/06/2021. The wound has been in treatment 6 weeks. The wound is located on the Right,Circumferential Lower Leg. The wound measures 34cm length x 43cm width x 0.1cm depth; 1148.252cm^2 area and 114.825cm^3 volume. There is Fat Layer (Subcutaneous Tissue) exposed. There is no tunneling or undermining noted. There is a large amount of serosanguineous drainage noted. The wound margin is distinct with the outline attached to the wound base. There is large (67-100%) granulation within the wound bed. There is a small (1-33%) amount of necrotic tissue within the wound bed including Adherent Slough. Wound #7R status is Open. Original cause of wound was Blister. The date acquired was: 04/20/2020. The wound has been in treatment 40 weeks. The wound is located on the Left,Circumferential Lower Leg. The wound measures 0.1cm length x 0.1cm width x 0.2cm depth; 0.008cm^2 area and 0.002cm^3 volume. There is Fat Layer (Subcutaneous Tissue) exposed. There is no tunneling or undermining noted. There is a medium amount of serosanguineous drainage noted. The wound margin is distinct with the outline attached to the wound base. There is large (67-100%) red granulation within the wound bed. There is a small (1-33%) amount of necrotic tissue within the wound bed including Adherent Slough. Assessment Active Problems ICD-10 Squamous cell carcinoma of skin, unspecified Non-pressure chronic ulcer of other part of right lower  leg with fat layer exposed Non-pressure chronic ulcer of other part of left lower leg with fat layer exposed Lymphedema,  not elsewhere classified Chronic venous hypertension (idiopathic) with ulcer of bilateral lower extremity Despite using the juxta light compression to the right lower extremity she developed swelling and subsequently wounds. At this time I recommended again using silver alginate under 3 layer compression. Patient also has lymphedema and would likely benefit from lymphedema pumps. She has stage 2 lymphedema and despite elevation exercise and compression stockings she continues to have symptoms of swelling, open wounds and weeping. I would also like to obtain venous reflux studies. She currently takes 40 mg of Lasix and may benefit from a higher dose. She states she is going to discuss this with her primary care physician. Plan Follow-up Appointments: Return Appointment in 1 week. - Dr. Heber  and [name], Room 8 Other: - ****Someone will call you with you appt time. Ven[facility]; if scheduled before you come back call wound center to let nurse know when appt is so we can schedule a nurse visit appt to rewrap after the tests- schedule a morning appt time with them if possible. ****Ordering Lymphedema pumps to use a home.**** Bathing/ Shower/ Hygiene: May shower with protection but do not get wound dressing(s) wet. - Use cast protector Edema Control - Lymphedema / SCD / Other: Lymphedema Pumps. Use Lymphedema pumps on leg(s) 2-3 times a day for 45-60 minutes. If wearing any wraps or hose, do not remove them. Continue exercising as instructed. - wound center to order. Elevate legs to the level of the heart or above for 30 minutes daily and/or when sitting, a frequency of: - elevate the legs throughout the day heart level if possible. Avoid standing for long periods of time. Exercise regularly Additional Orders / Instructions: Follow Nutritious  Diet Services and Therapies ordered were: Venous Studies -Bilateral - [facility] venous reflux studies both legs. WOUND #19: - Lower Leg Wound Laterality: Right, Circumferential Cleanser: Soap and Water 1 x Per Week/15 Days Discharge Instructions: May shower and wash wound with dial antibacterial soap and water prior to dressing change. Cleanser: Wound Cleanser 1 x Per Week/15 Days Discharge Instructions: Cleanse the wound with wound cleanser prior to applying a clean dressing using gauze sponges, not tissue or cotton balls. Peri-Wound Care: Triamcinolone 15 (g) 1 x Per Week/15 Days Discharge Instructions: In clinic only. Use triamcinolone 15 (g) in clinic only. Peri-Wound Care: Zinc Oxide Ointment 30g tube 1 x Per Week/15 Days Discharge Instructions: Apply Zinc Oxide to periwound with each dressing change Peri-Wound Care: Sween Lotion (Moisturizing lotion) 1 x Per Week/15 Days Discharge Instructions: Apply moisturizing lotion as directed Prim Dressing: KerraCel Ag Gelling Fiber Dressing, 4x5 in (silver alginate) 1 x Per Week/15 Days ary Discharge Instructions: Apply silver alginate to wound bed as instructed Secondary Dressing: Woven Gauze Sponge, Non-Sterile 4x4 in 1 x Per Week/15 Days Discharge Instructions: Apply over primary dressing as directed. Secondary Dressing: ABD Pad, 8x10 1 x Per Week/15 Days Discharge Instructions: Apply over primary dressing as directed. Secondary Dressing: Zetuvit Plus 4x8 in 1 x Per Week/15 Days Discharge Instructions: Apply over primary dressing as directed. Com pression Wrap: ThreePress (3 layer compression wrap) 1 x Per Week/15 Days Discharge Instructions: Apply three layer compression as directed. WOUND #7R: - Lower Leg Wound Laterality: Left, Circumferential Cleanser: Soap and Water 1 x Per Week/15 Days Discharge Instructions: May shower and wash wound with dial antibacterial soap and water prior to dressing change. Cleanser: Wound Cleanser 1  x Per Week/15 Days Discharge Instructions: Cleanse the wound with wound cleanser  prior to applying a clean dressing using gauze sponges, not tissue or cotton balls. Peri-Wound Care: Triamcinolone 15 (g) 1 x Per Week/15 Days Discharge Instructions: In clinic only. Use triamcinolone 15 (g) in clinic only. Peri-Wound Care: Sween Lotion (Moisturizing lotion) 1 x Per Week/15 Days Discharge Instructions: Apply moisturizing lotion as directed Prim Dressing: KerraCel Ag Gelling Fiber Dressing, 4x5 in (silver alginate) 1 x Per Week/15 Days ary Discharge Instructions: Apply silver alginate to wound bed as instructed Secondary Dressing: Woven Gauze Sponge, Non-Sterile 4x4 in 1 x Per Week/15 Days Discharge Instructions: Apply over primary dressing as directed. Secondary Dressing: ABD Pad, 8x10 1 x Per Week/15 Days Discharge Instructions: Apply over primary dressing as directed. Com pression Wrap: ThreePress (3 layer compression wrap) 1 x Per Week/15 Days Discharge Instructions: Apply three layer compression as directed. 1. Silver alginate under 3 layer compression 2. Venous reflux studies 3. Lymphedema pumps 4. Follow-up in 1 week Electronic Signature(s) Signed: 03/21/2021 5:21:31 PM By: Deon Pilling RN, BSN Signed: 03/22/2021 10:01:33 AM By: Kalman Shan DO Previous Signature: 03/21/2021 2:15:07 PM Version By: Kalman Shan DO Entered By: Deon Pilling on 03/21/2021 16:55:47 -------------------------------------------------------------------------------- HxROS Details Patient Name: Date of Service: [name], [name]. 03/21/2021 1:30 PM Medical Record Number: [id number] Patient Account Number: [id number] Date of Birth/Sex: Treating RN: 1925/07/31 (88 y.o. [name] Primary Care Provider: [name] Other Clinician: Referring Provider: Treating Provider/Extender: [name] in Treatment: 63 Information Obtained From Patient Eyes Medical  History: Negative for: Cataracts; Optic Neuritis Ear/Nose/Mouth/Throat Medical History: Negative for: Chronic sinus problems/congestion; Middle ear problems Hematologic/Lymphatic Medical History: Positive for: Anemia Negative for: Hemophilia; Human Immunodeficiency Virus; Lymphedema; Sickle Cell Disease Respiratory Medical History: Negative for: Aspiration; Asthma; Chronic Obstructive Pulmonary Disease (COPD); Pneumothorax; Sleep Apnea; Tuberculosis Cardiovascular Medical History: Positive for: Arrhythmia - Atrial Flutter, A fibb; Congestive Heart Failure; Hypertension Negative for: Angina; Coronary Artery Disease; Deep Vein Thrombosis; Hypotension; Myocardial Infarction; Peripheral Arterial Disease; Peripheral Venous Disease; Phlebitis; Vasculitis Past Medical History Notes: hyperlipidemia Gastrointestinal Medical History: Positive for: Colitis Negative for: Cirrhosis ; Crohns; Hepatitis A; Hepatitis B; Hepatitis C Endocrine Medical History: Negative for: Type I Diabetes; Type II Diabetes Past Medical History Notes: hypothyroidism Genitourinary Medical History: Negative for: End Stage Renal Disease Immunological Medical History: Negative for: Lupus Erythematosus; Raynauds; Scleroderma Integumentary (Skin) Medical History: Negative for: History of Burn Musculoskeletal Medical History: Positive for: Osteoarthritis Negative for: Gout; Rheumatoid Arthritis; Osteomyelitis Neurologic Medical History: Negative for: Dementia; Neuropathy; Quadriplegia; Paraplegia; Seizure Disorder Past Medical History Notes: lumbar spindylolysis Oncologic Medical History: Negative for: Received Chemotherapy; Received Radiation Past Medical History Notes: BLE squamous ceel carcionoma Immunizations Pneumococcal Vaccine: Received Pneumococcal Vaccination: Yes Received Pneumococcal Vaccination On or After 60th Birthday: No Implantable Devices None Hospitalization / Surgery History Type  of Hospitalization/Surgery removal of rod left leg Family and Social History Unknown History: Yes; Never smoker; Marital Status - Single; Alcohol Use: Never; Drug Use: No History; Caffeine Use: Never; Financial Concerns: No; Food, Clothing or Shelter Needs: No; Support System Lacking: No; Transportation Concerns: No Electronic Signature(s) Signed: 03/21/2021 2:15:07 PM By: Kalman Shan DO Signed: 03/21/2021 4:17:36 PM By: Rhae Hammock RN Entered By: Kalman Shan on 03/21/2021 14:09:44 -------------------------------------------------------------------------------- SuperBill Details Patient Name: Date of Service: [name], [name] 03/21/2021 Medical Record Number: [id number] Patient Account Number: [id number] Date of Birth/Sex: Treating RN: 18-Jun-1925 (88 y.o. [name] Primary Care Provider: [name] Other Clinician: Referring Provider: Treating Provider/Extender: [name] in  Treatment: 40 Diagnosis Coding ICD-10 Codes Code Description C44.92 Squamous cell carcinoma of skin, unspecified L97.812 Non-pressure chronic ulcer of other part of right lower leg with fat layer exposed L97.822 Non-pressure chronic ulcer of other part of left lower leg with fat layer exposed I89.0 Lymphedema, not elsewhere classified I87.313 Chronic venous hypertension (idiopathic) with ulcer of bilateral lower extremity Facility Procedures CPT4: Code [id number] Description: 81 BILATERAL: Application of multi-layer venous compression system; leg (below knee), including ankle and t. Modifier: Quantity: 1 Physician Procedures : CPT4 Code Description Modifier [id number] - WC PHYS LEVEL 3 - EST PT ICD-10 Diagnosis Description R83.094 Non-pressure chronic ulcer of other part of right lower leg with fat layer exposed L97.822 Non-pressure chronic ulcer of other part of left  lower leg with fat layer exposed I89.0 Lymphedema, not elsewhere  classified I87.313 Chronic venous hypertension (idiopathic) with ulcer of bilateral lower extremity Quantity: 1 Electronic Signature(s) Signed: 03/21/2021 2:15:07 PM By: Kalman Shan DO Entered By: Kalman Shan on 03/21/2021 14:14:51

## 2021-03-22 ENCOUNTER — Telehealth: Payer: Self-pay | Admitting: Oncology

## 2021-03-22 NOTE — Telephone Encounter (Signed)
Patient calling in ? ?Patient wanted to know if provider had chance to review note from yesterday & increase med dosage ? ?Please fu w/ patient [phone] ?

## 2021-03-22 NOTE — Telephone Encounter (Signed)
Wound center has advised the patient to increase her Furosemide 40 mg, since patient still has swelling in her leg.  ? ?Please advise as Dr. Alain Marion is out of the office. ?

## 2021-03-22 NOTE — Telephone Encounter (Signed)
Scheduled per 02/24 los, patient has been called and voicemail was left. 

## 2021-03-22 NOTE — Telephone Encounter (Signed)
Review of records state she had chronic venous insufficiency in her legs so likely swelling will continue to be present She does have CKD so I am not comfortable changing lasix without an acute change in the leg. Additionally it seems there is concern for recurrence of cancer in her leg which could be causing the swelling. I recommend await PCP Monday and he can advise.  ?

## 2021-03-25 ENCOUNTER — Other Ambulatory Visit: Payer: Self-pay | Admitting: Internal Medicine

## 2021-03-25 ENCOUNTER — Ambulatory Visit (HOSPITAL_COMMUNITY)
Admission: RE | Admit: 2021-03-25 | Discharge: 2021-03-25 | Disposition: A | Discharge status: Home / Self Care | Payer: Medicare Other | Source: Ambulatory Visit | Attending: Internal Medicine | Admitting: Internal Medicine

## 2021-03-25 ENCOUNTER — Encounter (HOSPITAL_BASED_OUTPATIENT_CLINIC_OR_DEPARTMENT_OTHER): Payer: Medicare Other | Admitting: Internal Medicine

## 2021-03-25 ENCOUNTER — Other Ambulatory Visit: Payer: Self-pay

## 2021-03-25 ENCOUNTER — Other Ambulatory Visit (HOSPITAL_COMMUNITY): Payer: Self-pay | Admitting: Internal Medicine

## 2021-03-25 DIAGNOSIS — L97919 Non-pressure chronic ulcer of unspecified part of right lower leg with unspecified severity: Secondary | ICD-10-CM | POA: Insufficient documentation

## 2021-03-25 DIAGNOSIS — I87313 Chronic venous hypertension (idiopathic) with ulcer of bilateral lower extremity: Secondary | ICD-10-CM | POA: Insufficient documentation

## 2021-03-25 DIAGNOSIS — L97812 Non-pressure chronic ulcer of other part of right lower leg with fat layer exposed: Secondary | ICD-10-CM | POA: Insufficient documentation

## 2021-03-25 DIAGNOSIS — L97822 Non-pressure chronic ulcer of other part of left lower leg with fat layer exposed: Secondary | ICD-10-CM | POA: Diagnosis not present

## 2021-03-25 DIAGNOSIS — L97929 Non-pressure chronic ulcer of unspecified part of left lower leg with unspecified severity: Secondary | ICD-10-CM | POA: Insufficient documentation

## 2021-03-25 DIAGNOSIS — I890 Lymphedema, not elsewhere classified: Secondary | ICD-10-CM | POA: Diagnosis not present

## 2021-03-25 DIAGNOSIS — I509 Heart failure, unspecified: Secondary | ICD-10-CM | POA: Diagnosis not present

## 2021-03-25 DIAGNOSIS — I110 Hypertensive heart disease with heart failure: Secondary | ICD-10-CM | POA: Diagnosis not present

## 2021-03-25 DIAGNOSIS — I872 Venous insufficiency (chronic) (peripheral): Secondary | ICD-10-CM | POA: Diagnosis not present

## 2021-03-25 NOTE — Progress Notes (Signed)
Lawson Jordyan A. ([id number]) ?, ?Visit Report for 03/25/2021 ?Arrival Information Details ?Patient Name: Date of Service: ?[name], Takerra A. 03/25/2021 4:00 PM ?Medical Record Number: [id number] ?Patient Account Number: [id number] ?Date of Birth/Sex: Treating RN: ?[birth year] (88 y.o. F) Deaton, Bobbi ?Primary Care Nashayla Telleria: [name] Other Clinician: ?Referring Rickeya Manus: ?Treating Demarus Latterell/Extender: Kalman Shan ?[name], [name] ?Weeks in Treatment: 41 ?Visit Information History Since Last Visit ?Added or deleted any medications: No ?Patient Arrived: [name] ?Any new allergies or adverse reactions: No ?Arrival Time: 16:05 ?Had a fall or experienced change in No ?Accompanied By: daughter ?activities of daily living that may affect ?Transfer Assistance: None ?risk of falls: ?Patient Identification Verified: Yes ?Signs or symptoms of abuse/neglect since last visito No ?Secondary Verification Process Completed: Yes ?Hospitalized since last visit: No ?Patient Requires Transmission-Based No ?Implantable device outside of the clinic excluding No ?Precautions: ?cellular tissue based products placed in the center ?Patient Has Alerts: Yes ?since last visit: ?Patient Alerts: R ABI = Non compressible ?Has Dressing in Place as Prescribed: Yes ?L ABI = Non ?Has Compression in Place as Prescribed: No ?compressible ?Pain Present Now: No ?Notes ?here for a rewrap of right leg after having venous reflux tests. Only rewrapping the right leg today as the tests were not performed on the left leg. ?Electronic Signature(s) ?Signed: 03/25/2021 5:15:48 PM By: Deon Pilling RN, BSN ?Entered By: Deon Pilling on 03/25/2021 17:04:33 ?-------------------------------------------------------------------------------- ?Compression Therapy Details ?Patient Name: Date of Service: ?[name], Chantilly A. 03/25/2021 4:00 PM ?Medical Record Number: [id number] ?Patient Account Number: [id number] ?Date of Birth/Sex: Treating RN: ?08-29-1925 (88 y.o. F)  Deaton, Bobbi ?Primary Care Marlita Keil: [name] Other Clinician: ?Referring Caylan Schifano: ?Treating Lareina Espino/Extender: Kalman Shan ?[name], [name] ?Weeks in Treatment: 41 ?Compression Therapy Performed for Wound Assessment: Wound #19 Right,Circumferential Lower Leg ?Performed By: Clinician Deon Pilling, RN ?Compression Type: Three Layer ?Electronic Signature(s) ?Signed: 03/25/2021 5:15:48 PM By: Deon Pilling RN, BSN ?Entered By: Deon Pilling on 03/25/2021 17:05:03 ?-------------------------------------------------------------------------------- ?Encounter Discharge Information Details ?Patient Name: ?Date of Service: ?[name], Kirk A. 03/25/2021 4:00 PM ?Medical Record Number: [id number] ?Patient Account Number: [id number] ?Date of Birth/Sex: ?Treating RN: ?1925/12/20 (88 y.o. F) Deaton, Bobbi ?Primary Care Ashawnti Tangen: [name] ?Other Clinician: ?Referring Avik Leoni: ?Treating Jance Siek/Extender: Kalman Shan ?[name], [name] ?Weeks in Treatment: 41 ?Encounter Discharge Information Items ?Discharge Condition: Stable ?Ambulatory Status: [name] ?Discharge Destination: Home ?Transportation: Private Auto ?Accompanied By: daughter ?Schedule Follow-up Appointment: Yes ?Clinical Summary of Care: ?Electronic Signature(s) ?Signed: 03/25/2021 5:15:48 PM By: Deon Pilling RN, BSN ?Entered By: Deon Pilling on 03/25/2021 17:06:30 ?-------------------------------------------------------------------------------- ?Patient/Caregiver Education Details ?Patient Name: ?Date of Service: ?[name], Manvir A. 3/6/2023andnbsp4:00 PM ?Medical Record Number: [id number] ?Patient Account Number: [id number] ?Date of Birth/Gender: ?Treating RN: ?[birth year] (88 y.o. F) Deaton, Bobbi ?Primary Care Physician: [name] ?Other Clinician: ?Referring Physician: ?Treating Physician/Extender: Kalman Shan ?[name], [name] ?Weeks in Treatment: 41 ?Education Assessment ?Education Provided  To: ?Patient ?Education Topics Provided ?Wound/Skin Impairment: ?Handouts: Skin Care Do's and Dont's ?Methods: Explain/Verbal ?Responses: Reinforcements needed ?Electronic Signature(s) ?Signed: 03/25/2021 5:15:48 PM By: Deon Pilling RN, BSN ?Entered By: Deon Pilling on 03/25/2021 17:05:45 ?-------------------------------------------------------------------------------- ?Wound Assessment Details ?Patient Name: ?Date of Service: ?[name], Teneil A. 03/25/2021 4:00 PM ?Medical Record Number: [id number] ?Patient Account Number: [id number] ?Date of Birth/Sex: ?Treating RN: ?1925/04/08 (88 y.o. F) Deaton, Bobbi ?Primary Care Divine Hansley: [name] ?Other Clinician: ?Referring Sharrieff Spratlin: ?Treating Allyce Bochicchio/Extender: Kalman Shan ?[name], [name] ?Weeks in Treatment: 41 ?Wound Status ?Wound Number: 19 ?Primary Etiology: Venous Leg  Ulcer ?Wound Location: Right, Circumferential Lower Leg ?Wound Status: Open ?Wounding Event: Blister ?Date Acquired: 02/06/2021 ?Weeks Of Treatment: 6 ?Clustered Wound: Yes ?Wound Measurements ?Length: (cm) 34 ?Width: (cm) 43 ?Depth: (cm) 0.1 ?Area: (cm?) 1148.252 ?Volume: (cm?) 114.825 ?% Reduction in Area: -126.2% ?% Reduction in Volume: 54.8% ?Wound Description ?Classification: Full Thickness Without Exposed Support Structu ?Exudate Amount: Large ?Exudate Type: Serosanguineous ?Exudate Color: red, brown ?res ?Treatment Notes ?Wound #19 (Lower Leg) Wound Laterality: Right, Circumferential ?Cleanser ?Soap and Water ?Discharge Instruction: May shower and wash wound with dial antibacterial soap and water prior to dressing change. ?Peri-Wound Care ?Triamcinolone 15 (g) ?Discharge Instruction: Use triamcinolone 15 (g) as directed ?Sween Lotion (Moisturizing lotion) ?Discharge Instruction: Apply moisturizing lotion as directed ?Topical ?Primary Dressing ?KerraCel Ag Gelling Fiber Dressing, 4x5 in (silver alginate) ?Discharge Instruction: Apply silver alginate to wound bed as  instructed ?Secondary Dressing ?ABD Pad, 8x10 ?Discharge Instruction: Apply over primary dressing as directed. ?Zetuvit Plus 4x8 in ?Discharge Instruction: Apply over primary dressing as directed. ?Secured With ?Compression Wrap ?ThreePress (3 layer compression wrap) ?Discharge Instruction: Apply three layer compression as directed. ?Compression Stockings ?Add-Ons ?Electronic Signature(s) ?Signed: 03/25/2021 5:15:48 PM By: Deon Pilling RN, BSN ?Entered By: Deon Pilling on 03/25/2021 17:04:46 ?

## 2021-03-25 NOTE — Telephone Encounter (Signed)
Wound center has advised the patient to increase her Furosemide (lasix) 40 mg due to swelling in her leg on Friday 3/2. I attempted to call patient to see if her leg was still swollen, but there was no answer. Patient is wondering what you advise. ?

## 2021-03-26 ENCOUNTER — Inpatient Hospital Stay: Payer: Medicare Other

## 2021-03-26 ENCOUNTER — Inpatient Hospital Stay: Payer: Medicare Other | Attending: Oncology

## 2021-03-26 VITALS — BP 119/57 | HR 66 | Temp 97.7°F | Resp 16 | Wt 155.0 lb

## 2021-03-26 DIAGNOSIS — Z79899 Other long term (current) drug therapy: Secondary | ICD-10-CM | POA: Diagnosis not present

## 2021-03-26 DIAGNOSIS — C4490 Unspecified malignant neoplasm of skin, unspecified: Secondary | ICD-10-CM

## 2021-03-26 DIAGNOSIS — Z5112 Encounter for antineoplastic immunotherapy: Secondary | ICD-10-CM | POA: Diagnosis not present

## 2021-03-26 DIAGNOSIS — C50912 Malignant neoplasm of unspecified site of left female breast: Secondary | ICD-10-CM

## 2021-03-26 DIAGNOSIS — C44721 Squamous cell carcinoma of skin of unspecified lower limb, including hip: Secondary | ICD-10-CM | POA: Insufficient documentation

## 2021-03-26 DIAGNOSIS — E064 Drug-induced thyroiditis: Secondary | ICD-10-CM

## 2021-03-26 DIAGNOSIS — C50412 Malignant neoplasm of upper-outer quadrant of left female breast: Secondary | ICD-10-CM

## 2021-03-26 DIAGNOSIS — Z170 Estrogen receptor positive status [ER+]: Secondary | ICD-10-CM

## 2021-03-26 LAB — CBC WITH DIFFERENTIAL/PLATELET
Abs Immature Granulocytes: 0.01 10*3/uL (ref 0.00–0.07)
Basophils Absolute: 0.1 10*3/uL (ref 0.0–0.1)
Basophils Relative: 1 %
Eosinophils Absolute: 0.2 10*3/uL (ref 0.0–0.5)
Eosinophils Relative: 3 %
HCT: 35.4 % — ABNORMAL LOW (ref 36.0–46.0)
Hemoglobin: 11.4 g/dL — ABNORMAL LOW (ref 12.0–15.0)
Immature Granulocytes: 0 %
Lymphocytes Relative: 19 %
Lymphs Abs: 1 10*3/uL (ref 0.7–4.0)
MCH: 34.3 pg — ABNORMAL HIGH (ref 26.0–34.0)
MCHC: 32.2 g/dL (ref 30.0–36.0)
MCV: 106.6 fL — ABNORMAL HIGH (ref 80.0–100.0)
Monocytes Absolute: 0.7 10*3/uL (ref 0.1–1.0)
Monocytes Relative: 15 %
Neutro Abs: 3.1 10*3/uL (ref 1.7–7.7)
Neutrophils Relative %: 62 %
Platelets: 255 10*3/uL (ref 150–400)
RBC: 3.32 MIL/uL — ABNORMAL LOW (ref 3.87–5.11)
RDW: 13.8 % (ref 11.5–15.5)
WBC: 5 10*3/uL (ref 4.0–10.5)
nRBC: 0 % (ref 0.0–0.2)

## 2021-03-26 LAB — COMPREHENSIVE METABOLIC PANEL
ALT: 11 U/L (ref 0–44)
AST: 18 U/L (ref 15–41)
Albumin: 3.6 g/dL (ref 3.5–5.0)
Alkaline Phosphatase: 59 U/L (ref 38–126)
Anion gap: 7 (ref 5–15)
BUN: 21 mg/dL (ref 8–23)
CO2: 28 mmol/L (ref 22–32)
Calcium: 9.2 mg/dL (ref 8.9–10.3)
Chloride: 103 mmol/L (ref 98–111)
Creatinine, Ser: 0.97 mg/dL (ref 0.44–1.00)
GFR, Estimated: 54 mL/min — ABNORMAL LOW (ref >60)
Glucose, Bld: 114 mg/dL — ABNORMAL HIGH (ref 70–99)
Potassium: 4 mmol/L (ref 3.5–5.1)
Sodium: 138 mmol/L (ref 135–145)
Total Bilirubin: 0.6 mg/dL (ref 0.3–1.2)
Total Protein: 7.4 g/dL (ref 6.5–8.1)

## 2021-03-26 MED ORDER — SODIUM CHLORIDE 0.9 % IV SOLN: Freq: Once | INTRAVENOUS | Status: AC

## 2021-03-26 MED ORDER — SODIUM CHLORIDE 0.9 % IV SOLN
350.0000 mg | Freq: Once | INTRAVENOUS | Status: AC
  Administered 2021-03-26: 350 mg via INTRAVENOUS
  Filled 2021-03-26: qty 7

## 2021-03-26 NOTE — Patient Instructions (Signed)
[facility]  Discharge Instructions: °Thank you for choosing [facility] to provide your oncology and hematology care.  ° °If you have a lab appointment with the Cancer Center, please go directly to the Cancer Center and check in at the registration area. °  °Wear comfortable clothing and clothing appropriate for easy access to any Portacath or PICC line.  ° °We strive to give you quality time with your provider. You may need to reschedule your appointment if you arrive late (15 or more minutes).  Arriving late affects you and other patients whose appointments are after yours.  Also, if you miss three or more appointments without notifying the office, you may be dismissed from the clinic at the provider’s discretion.    °  °For prescription refill requests, have your pharmacy contact our office and allow 72 hours for refills to be completed.   ° °Today you received the following chemotherapy and/or immunotherapy agents Libtayo    °  °To help prevent nausea and vomiting after your treatment, we encourage you to take your nausea medication as directed. ° °BELOW ARE SYMPTOMS THAT SHOULD BE REPORTED IMMEDIATELY: °*FEVER GREATER THAN 100.4 F (38 °C) OR HIGHER °*CHILLS OR SWEATING °*NAUSEA AND VOMITING THAT IS NOT CONTROLLED WITH YOUR NAUSEA MEDICATION °*UNUSUAL SHORTNESS OF BREATH °*UNUSUAL BRUISING OR BLEEDING °*URINARY PROBLEMS (pain or burning when urinating, or frequent urination) °*BOWEL PROBLEMS (unusual diarrhea, constipation, pain near the anus) °TENDERNESS IN MOUTH AND THROAT WITH OR WITHOUT PRESENCE OF ULCERS (sore throat, sores in mouth, or a toothache) °UNUSUAL RASH, SWELLING OR PAIN  °UNUSUAL VAGINAL DISCHARGE OR ITCHING  ° °Items with * indicate a potential emergency and should be followed up as soon as possible or go to the Emergency Department if any problems should occur. ° °Please show the CHEMOTHERAPY ALERT CARD or IMMUNOTHERAPY ALERT CARD at check-in to the  Emergency Department and triage nurse. ° °Should you have questions after your visit or need to cancel or reschedule your appointment, please contact [facility]  Dept: [phone]  and follow the prompts.  Office hours are 8:00 a.m. to 4:30 p.m. Monday - Friday. Please note that voicemails left after 4:00 p.m. may not be returned until the following business day.  We are closed weekends and major holidays. You have access to a nurse at all times for urgent questions. Please call the main number to the clinic Dept: [phone] and follow the prompts. ° ° °For any non-urgent questions, you may also contact your provider using MyChart. We now offer e-Visits for anyone 18 and older to request care online for non-urgent symptoms. For details visit [email or web address]. °  °Also download the MyChart app! Go to the app store, search "MyChart", open the app, select [facility], and log in with your MyChart username and password. ° °Due to Covid, a mask is required upon entering the hospital/clinic. If you do not have a mask, one will be given to you upon arrival. For doctor visits, patients may have 1 support person aged 18 or older with them. For treatment visits, patients cannot have anyone with them due to current Covid guidelines and our immunocompromised population.  ° °Cemiplimab injection °What is this medication? °CEMIPLIMAB (se mip li mab) is a monoclonal antibody. It treats certain types of cancer. Some of the cancers treated are cutaneous squamous cell carcinoma and basal cell carcinoma. °This medicine may be used for other purposes; ask your health care provider or   pharmacist if you have questions. °COMMON BRAND NAME(S): LIBTAYO °What should I tell my care team before I take this medication? °They need to know if you have any of these conditions: °autoimmune diseases like Crohn's disease, ulcerative colitis, or lupus °have had or planning to have an allogeneic stem cell  transplant (uses someone else's stem cells) °history of organ transplant °nervous system problems like myasthenia gravis or Guillain-Barre syndrome °an unusual or allergic reaction to cemiplimab, other drugs, foods, dyes, or preservatives °pregnant or trying to get pregnant °breast-feeding °How should I use this medication? °This medicine is for infusion into a vein. It is given by a health care professional in a hospital or clinic setting. °A special MedGuide will be given to you before each treatment. Be sure to read this information carefully each time. °Talk to your pediatrician regarding the use of this medicine in children. Special care may be needed. °Overdosage: If you think you have taken too much of this medicine contact a poison control center or emergency room at once. °NOTE: This medicine is only for you. Do not share this medicine with others. °What if I miss a dose? °It is important not to miss your dose. Call your doctor or health care professional if you are unable to keep an appointment. °What may interact with this medication? °Interactions have not been studied. °This list may not describe all possible interactions. Give your health care provider a list of all the medicines, herbs, non-prescription drugs, or dietary supplements you use. Also tell them if you smoke, drink alcohol, or use illegal drugs. Some items may interact with your medicine. °What should I watch for while using this medication? °Your condition will be monitored carefully while you are receiving this medicine. °You may need blood work done while you are taking this medicine. °Do not become pregnant while taking this medicine or for at least 4 months after stopping it. Women should inform their doctor if they wish to become pregnant or think they might be pregnant. There is a potential for serious side effects to an unborn child. Talk to your health care professional or pharmacist for more information. Do not breast-feed an  infant while taking this medicine or for at least 4 months after the last dose. °What side effects may I notice from receiving this medication? °Side effects that you should report to your doctor or health care professional as soon as possible: °allergic reactions like skin rash, itching or hives; swelling of the face, lips, or tongue °black, tarry stools °bloody or watery diarrhea °breathing problems °changes in vision °changes in voice °chest pain or chest tightness °chills °cough °dizziness °fast or irregular heart beat °feeling faint or lightheaded °hair loss °increased hunger or thirst °muscle weakness °persistent headache °redness, blistering, peeling or loosening of the skin, including inside the mouth °signs and symptoms of kidney injury like trouble passing urine or change in the amount of urine °signs and symptoms of liver injury like dark yellow or brown urine; general ill feeling or flu-like symptoms; light-colored stools; loss of appetite; nausea; right upper belly pain; unusually weak or tired; yellowing of the eyes or skin °stomach pain °unusual bleeding or bruising °weight gain or weight loss °unusual sweating °Side effects that usually do not require medical attention (report these to your doctor or health care professional if they continue or are bothersome): °bone pain °constipation °muscle pain °tiredness °This list may not describe all possible side effects. Call your doctor for medical advice about side effects.   You may report side effects to FDA at 1-800-FDA-1088. °Where should I keep my medication? °This drug is given in a hospital or clinic and will not be stored at home. °NOTE: This sheet is a summary. It may not cover all possible information. If you have questions about this medicine, talk to your doctor, pharmacist, or health care provider. °© 2022 [employer]/Gold Standard (2020-09-25 00:00:00) ° ° °

## 2021-03-26 NOTE — Progress Notes (Signed)
Ok per Dr Alen Blew to proceed without CMP results today. ?

## 2021-03-26 NOTE — Progress Notes (Signed)
[place] Gaelle A. ([id number]) ?, ?Visit Report for 03/25/2021 ?SuperBill Details ?Patient Name: Date of Service: ?[name], [name] 03/25/2021 ?Medical Record Number: [id number] ?Patient Account Number: [id number] ?Date of Birth/Sex: Treating RN: ?02/07/1925 (88 y.o. F) Deaton, Bobbi ?Primary Care Provider: [name] Other Clinician: ?Referring Provider: ?Treating Provider/Extender: Kalman Shan ?[name], [name] ?Weeks in Treatment: 41 ?Diagnosis Coding ?ICD-10 Codes ?Code Description ?C44.92 Squamous cell carcinoma of skin, unspecified ?P37.902 Non-pressure chronic ulcer of other part of right lower leg with fat layer exposed ?I09.735 Non-pressure chronic ulcer of other part of left lower leg with fat layer exposed ?I89.0 Lymphedema, not elsewhere classified ?I87.313 Chronic venous hypertension (idiopathic) with ulcer of bilateral lower extremity ?Facility Procedures ?CPT4 Code Description Modifier Quantity ?32992426 (Facility Use Only) [id number] - APPLY MULTLAY COMPRS LWR RT LEG 1 ?Electronic Signature(s) ?Signed: 03/25/2021 5:15:48 PM By: Deon Pilling RN, BSN ?Signed: 03/26/2021 4:49:21 PM By: Kalman Shan DO ?Entered By: Deon Pilling on 03/25/2021 17:06:42 ?

## 2021-03-26 NOTE — Telephone Encounter (Signed)
I agree with wound center recommendation to increase Lasix to 40 mg.  Thank you ?

## 2021-03-27 LAB — TSH: TSH: 0.751 u[IU]/mL (ref 0.308–3.960)

## 2021-03-27 NOTE — Telephone Encounter (Signed)
Called patient to relay that Dr would like her to increase Lasix to 40 mg, pt states she is already taking 40 mg and would like to know what dosage he would like to increase Lasix to and please send prescription to: ? ?[place], [place] Phone:  [phone]  ?Fax:  [phone]  ?  ? ?

## 2021-03-28 ENCOUNTER — Encounter (HOSPITAL_BASED_OUTPATIENT_CLINIC_OR_DEPARTMENT_OTHER): Payer: Medicare Other | Admitting: General Surgery

## 2021-03-28 ENCOUNTER — Other Ambulatory Visit: Payer: Self-pay

## 2021-03-28 DIAGNOSIS — I872 Venous insufficiency (chronic) (peripheral): Secondary | ICD-10-CM | POA: Diagnosis not present

## 2021-03-28 DIAGNOSIS — I890 Lymphedema, not elsewhere classified: Secondary | ICD-10-CM | POA: Diagnosis not present

## 2021-03-28 DIAGNOSIS — L97812 Non-pressure chronic ulcer of other part of right lower leg with fat layer exposed: Secondary | ICD-10-CM | POA: Diagnosis not present

## 2021-03-28 DIAGNOSIS — L97822 Non-pressure chronic ulcer of other part of left lower leg with fat layer exposed: Secondary | ICD-10-CM | POA: Diagnosis not present

## 2021-03-28 DIAGNOSIS — I110 Hypertensive heart disease with heart failure: Secondary | ICD-10-CM | POA: Diagnosis not present

## 2021-03-28 DIAGNOSIS — I509 Heart failure, unspecified: Secondary | ICD-10-CM | POA: Diagnosis not present

## 2021-03-28 MED ORDER — FUROSEMIDE 40 MG PO TABS: 40.0000 mg | ORAL_TABLET | Freq: Two times a day (BID) | ORAL | 5 refills | Status: DC

## 2021-03-28 NOTE — Progress Notes (Signed)
[name] ([id number]) , Visit Report for 03/28/2021 Arrival Information Details Patient Name: Date of Service: [name], [place]. 03/28/2021 1:30 PM Medical Record Number: [id number] Patient Account Number: [id number] Date of Birth/Sex: Treating RN: 1925/10/20 (88 y.o. [name], [name] Primary Care [name]: [name] Other Clinician: Referring [name]: Treating [name]/Extender: [name] in Treatment: 42 Visit Information History Since Last Visit All ordered tests and consults were completed: Yes Patient Arrived: [name] Added or deleted any medications: No Arrival Time: 13:45 Any new allergies or adverse reactions: No Accompanied By: daughter Had a fall or experienced change in No Transfer Assistance: None activities of daily living that may affect Patient Identification Verified: Yes risk of falls: Secondary Verification Process Completed: Yes Signs or symptoms of abuse/neglect since last visito No Patient Requires Transmission-Based No Hospitalized since last visit: No Precautions: Implantable device outside of the clinic excluding No Patient Has Alerts: Yes cellular tissue based products placed in the center Patient Alerts: R ABI = Non compressible since last visit: L ABI = Non Has Dressing in Place as Prescribed: Yes compressible Has Compression in Place as Prescribed: Yes Pain Present Now: No Notes Explained the venous reflux study result of right leg per Dr. Heber [place] ok to explained to patient and daughter. Explained that Dr. Heber [place] will order a vascular consult if they are wanting to go see them for further evaluation of venous reflux. Per daughter and patient would like to hold off for now and continue compression wraps weekly at wound center, and try lymphedema pumps. Explained if seen by vascular it would be only a consultation where they would explained what they see and their recommendation. Daughter and patient in  agreement, but wait for now. Electronic Signature(s) Signed: 03/28/2021 5:31:26 PM By: [name] Pilling RN, BSN Entered By: [name] on 03/28/2021 16:49:46 -------------------------------------------------------------------------------- Compression Therapy Details Patient Name: Date of Service: [name], [place]. 03/28/2021 1:30 PM Medical Record Number: [id number] Patient Account Number: [id number] Date of Birth/Sex: Treating RN: September 05, 1925 (88 y.o. [name] Primary Care [name]: [name] Other Clinician: Referring [name]: Treating Jakylah Bell/Extender: [name] in Treatment: 74 Compression Therapy Performed for Wound Assessment: Wound #19 Right,Circumferential Lower Leg Performed By: Clinician [name] Pilling, RN Compression Type: Three Hydrologist) Signed: 03/28/2021 5:31:26 PM By: [name] Pilling RN, BSN Entered By: [name] on 03/28/2021 16:52:21 -------------------------------------------------------------------------------- Compression Therapy Details Patient Name: Date of Service: [name], [place]. 03/28/2021 1:30 PM Medical Record Number: [id number] Patient Account Number: [id number] Date of Birth/Sex: Treating RN: 03-16-1925 (88 y.o. [name] Primary Care [name]: [name] Other Clinician: Referring [name]: Treating [name]/Extender: [name] in Treatment: 61 Compression Therapy Performed for Wound Assessment: Wound #7R Left,Circumferential Lower Leg Performed By: Clinician [name] Pilling, RN Compression Type: Three Hydrologist) Signed: 03/28/2021 5:31:26 PM By: [name] Pilling RN, BSN Entered By: [name] on 03/28/2021 16:52:21 -------------------------------------------------------------------------------- Encounter Discharge Information Details Patient Name: Date of Service: [name], [place]. 03/28/2021 1:30 PM Medical  Record Number: [id number] Patient Account Number: [id number] Date of Birth/Sex: Treating RN: 1926-01-01 (88 y.o. [name], [name] Primary Care [name]: [name] Other Clinician: Referring [name]: Treating Aloria Looper/Extender: [name] in Treatment: 52 Encounter Discharge Information Items Discharge Condition: Stable Ambulatory Status: Walker Discharge Destination: Home Transportation: Private Auto Accompanied By: daughter Schedule Follow-up Appointment: Yes Clinical Summary of Care: Electronic Signature(s) Signed: 03/28/2021 5:31:26 PM By: [name]  [name] Klippel RN, BSN Entered By: [name] on 03/28/2021 16:53:17 -------------------------------------------------------------------------------- Patient/Caregiver Education Details Patient Name: Date of Service: [name], [name] 3/9/2023andnbsp1:30 PM Medical Record Number: [id number] Patient Account Number: [id number] Date of Birth/Gender: Treating RN: Aug 31, 1925 (88 y.o. [name] Primary Care Physician: [name] Other Clinician: Referring Physician: Treating Physician/Extender: [name] in Treatment: 81 Education Assessment Education Provided To: Patient Education Topics Provided Wound/Skin Impairment: Handouts: Skin Care Do's and Dont's Methods: Explain/Verbal Responses: Reinforcements needed Electronic Signature(s) Signed: 03/28/2021 5:31:26 PM By: [name] Pilling RN, BSN Entered By: [name] on 03/28/2021 16:52:58 -------------------------------------------------------------------------------- Wound Assessment Details Patient Name: Date of Service: [name], [place]. 03/28/2021 1:30 PM Medical Record Number: [id number] Patient Account Number: [id number] Date of Birth/Sex: Treating RN: Jul 12, 1925 (88 y.o. [name] Primary Care Erynne Kealey: [name] Other Clinician: Referring [name]: Treating  [name]/Extender: [name] in Treatment: 41 Wound Status Wound Number: 19 Primary Etiology: Venous Leg Ulcer Wound Location: Right, Circumferential Lower Leg Wound Status: Open Wounding Event: Blister Date Acquired: 02/06/2021 Weeks Of Treatment: 7 Clustered Wound: Yes Wound Measurements Length: (cm) 34 Width: (cm) 43 Depth: (cm) 0.1 Area: (cm) 1148.252 Volume: (cm) 114.825 % Reduction in Area: -126.2% % Reduction in Volume: 54.8% Wound Description Classification: Full Thickness Without Exposed Support Structu Exudate Amount: Large Exudate Type: Serosanguineous Exudate Color: red, brown res Treatment Notes Wound #19 (Lower Leg) Wound Laterality: Right, Circumferential Cleanser Soap and Water Discharge Instruction: May shower and wash wound with dial antibacterial soap and water prior to dressing change. Wound Cleanser Discharge Instruction: Cleanse the wound with wound cleanser prior to applying a clean dressing using gauze sponges, not tissue or cotton balls. Peri-Wound Care Triamcinolone 15 (g) Discharge Instruction: In clinic only. Use triamcinolone 15 (g) in clinic only. Zinc Oxide Ointment 30g tube Discharge Instruction: Apply Zinc Oxide to periwound with each dressing change Sween Lotion (Moisturizing lotion) Discharge Instruction: Apply moisturizing lotion as directed Topical Primary Dressing KerraCel Ag Gelling Fiber Dressing, 4x5 in (silver alginate) Discharge Instruction: Apply silver alginate to wound bed as instructed Secondary Dressing Woven Gauze Sponge, Non-Sterile 4x4 in Discharge Instruction: Apply over primary dressing as directed. ABD Pad, 8x10 Discharge Instruction: Apply over primary dressing as directed. Zetuvit Plus 4x8 in Discharge Instruction: Apply over primary dressing as directed. Secured With Compression Wrap ThreePress (3 layer compression wrap) Discharge Instruction: Apply three layer  compression as directed. Compression Stockings Add-Ons Electronic Signature(s) Signed: 03/28/2021 5:31:26 PM By: [name] Pilling RN, BSN Entered By: [name] on 03/28/2021 16:51:29 -------------------------------------------------------------------------------- Wound Assessment Details Patient Name: Date of Service: [name], [place]. 03/28/2021 1:30 PM Medical Record Number: [id number] Patient Account Number: [id number] Date of Birth/Sex: Treating RN: 1925/12/14 (88 y.o. [name], [name] Primary Care Xela Oregel: [name] Other Clinician: Referring Maymunah Stegemann: Treating Shahir Karen/Extender: [name] in Treatment: 21 Wound Status Wound Number: 7R Primary Etiology: Malignant Wound Wound Location: Left, Circumferential Lower Leg Wound Status: Open Wounding Event: Blister Date Acquired: 04/20/2020 Weeks Of Treatment: 41 Clustered Wound: Yes Wound Measurements Length: (cm) 0.1 Width: (cm) 0.1 Depth: (cm) 0.1 Area: (cm) 0.008 Volume: (cm) 0.001 % Reduction in Area: 99.7% % Reduction in Volume: 99.7% Wound Description Classification: Full Thickness Without Exposed Support Structu Exudate Amount: Medium Exudate Type: Serosanguineous Exudate Color: red, brown res Treatment Notes Wound #7R (Lower Leg) Wound Laterality: Left, Circumferential Cleanser Soap and Water Discharge Instruction: May shower and wash wound with dial antibacterial soap and  water prior to dressing change. Wound Cleanser Discharge Instruction: Cleanse the wound with wound cleanser prior to applying a clean dressing using gauze sponges, not tissue or cotton balls. Peri-Wound Care Triamcinolone 15 (g) Discharge Instruction: In clinic only. Use triamcinolone 15 (g) in clinic only. Sween Lotion (Moisturizing lotion) Discharge Instruction: Apply moisturizing lotion as directed Topical Primary Dressing KerraCel Ag Gelling Fiber Dressing, 4x5 in (silver  alginate) Discharge Instruction: Apply silver alginate to wound bed as instructed Secondary Dressing Woven Gauze Sponge, Non-Sterile 4x4 in Discharge Instruction: Apply over primary dressing as directed. ABD Pad, 8x10 Discharge Instruction: Apply over primary dressing as directed. Secured With Compression Wrap ThreePress (3 layer compression wrap) Discharge Instruction: Apply three layer compression as directed. Compression Stockings Add-Ons Electronic Signature(s) Signed: 03/28/2021 5:31:26 PM By: [name] Pilling RN, BSN Entered By: [name] on 03/28/2021 16:51:29 -------------------------------------------------------------------------------- Vitals Details Patient Name: Date of Service: [name], [place]. 03/28/2021 1:30 PM Medical Record Number: [id number] Patient Account Number: [id number] Date of Birth/Sex: Treating RN: Jul 06, 1925 (88 y.o. [name], [name] Primary Care Nolon Yellin: [name] Other Clinician: Referring Damante Spragg: Treating Lugene Hitt/Extender: [name] in Treatment: 39 Vital Signs Time Taken: 13:45 Temperature (F): 97.8 Height (in): 65 Pulse (bpm): 65 Weight (lbs): 163 Respiratory Rate (breaths/min): 20 Body Mass Index (BMI): 27.1 Blood Pressure (mmHg): 117/66 Reference Range: 80 - 120 mg / dl Electronic Signature(s) Signed: 03/28/2021 5:31:26 PM By: [name] Pilling RN, BSN Entered By: [name] on 03/28/2021 16:51:01

## 2021-03-28 NOTE — Telephone Encounter (Signed)
Called pt and LM per DPR on file. LM letting pt know to increase her lasix to 40 mg bid and that a new prescription has been sent to her pharmacy  ?

## 2021-03-28 NOTE — Telephone Encounter (Signed)
Increase Lasix to 40 mg bid ?Thx ?

## 2021-03-29 NOTE — Progress Notes (Signed)
[place] [facility] A. ([id number]) ?, ?Visit Report for 03/28/2021 ?SuperBill Details ?Patient Name: Date of Service: ?[name], [name] 03/28/2021 ?Medical Record Number: [id number] ?Patient Account Number: [id number] ?Date of Birth/Sex: Treating RN: ?[birth year] (88 y.o. F) Deaton, Bobbi ?Primary Care Provider: [name] Other Clinician: ?Referring Provider: ?Treating Provider/Extender: Fredirick Maudlin ?[name], [name] ?Weeks in Treatment: 41 ?Diagnosis Coding ?ICD-10 Codes ?Code Description ?C44.92 Squamous cell carcinoma of skin, unspecified ?H74.142 Non-pressure chronic ulcer of other part of right lower leg with fat layer exposed ?L95.320 Non-pressure chronic ulcer of other part of left lower leg with fat layer exposed ?I89.0 Lymphedema, not elsewhere classified ?I87.313 Chronic venous hypertension (idiopathic) with ulcer of bilateral lower extremity ?Facility Procedures ?CPT4 Description Modifier Quantity ?Code ?23343568 [id number] BILATERAL: Application of multi-layer venous compression system; leg (below knee), including ankle and 1 ?foot. ?Electronic Signature(s) ?Signed: 03/28/2021 5:31:26 PM By: Deon Pilling RN, BSN ?Signed: 03/29/2021 8:31:39 AM By: Fredirick Maudlin MD FACS ?Entered By: Deon Pilling on 03/28/2021 16:53:25 ?

## 2021-04-04 ENCOUNTER — Other Ambulatory Visit: Payer: Self-pay

## 2021-04-04 ENCOUNTER — Encounter (HOSPITAL_BASED_OUTPATIENT_CLINIC_OR_DEPARTMENT_OTHER): Payer: Medicare Other | Admitting: Internal Medicine

## 2021-04-04 DIAGNOSIS — I890 Lymphedema, not elsewhere classified: Secondary | ICD-10-CM | POA: Diagnosis not present

## 2021-04-04 DIAGNOSIS — L97822 Non-pressure chronic ulcer of other part of left lower leg with fat layer exposed: Secondary | ICD-10-CM | POA: Diagnosis not present

## 2021-04-04 DIAGNOSIS — L97812 Non-pressure chronic ulcer of other part of right lower leg with fat layer exposed: Secondary | ICD-10-CM | POA: Diagnosis not present

## 2021-04-04 DIAGNOSIS — I872 Venous insufficiency (chronic) (peripheral): Secondary | ICD-10-CM | POA: Diagnosis not present

## 2021-04-04 DIAGNOSIS — C4492 Squamous cell carcinoma of skin, unspecified: Secondary | ICD-10-CM | POA: Diagnosis not present

## 2021-04-04 DIAGNOSIS — I110 Hypertensive heart disease with heart failure: Secondary | ICD-10-CM | POA: Diagnosis not present

## 2021-04-04 DIAGNOSIS — I509 Heart failure, unspecified: Secondary | ICD-10-CM | POA: Diagnosis not present

## 2021-04-04 NOTE — Progress Notes (Signed)
Ransome Lella A. ([id number]) ?, ?Visit Report for 04/04/2021 ?Arrival Information Details ?Patient Name: Date of Service: ?[name], [name] 04/04/2021 9:45 A M ?Medical Record Number: [id number] ?Patient Account Number: [id number] ?Date of Birth/Sex: Treating RN: ?August 13, 1925 (88 y.o. F) Sharyn Creamer ?Primary Care Acel Natzke: [name] Other Clinician: ?Referring Brizeyda Holtmeyer: ?Treating Boe Deans/Extender: Kalman Shan ?[name], [name] ?Weeks in Treatment: 42 ?Visit Information History Since Last Visit ?Added or deleted any medications: No ?Patient Arrived: [name] ?Any new allergies or adverse reactions: No ?Arrival Time: 10:00 ?Had a fall or experienced change in No ?Transfer Assistance: None ?activities of daily living that may affect ?Patient Identification Verified: Yes ?risk of falls: ?Secondary Verification Process Completed: Yes ?Signs or symptoms of abuse/neglect since last visito No ?Patient Requires Transmission-Based No ?Hospitalized since last visit: No ?Precautions: ?Implantable device outside of the clinic excluding No ?Patient Has Alerts: Yes ?cellular tissue based products placed in the center ?Patient Alerts: R ABI = Non compressible ?since last visit: ?L ABI = Non compressible ?Has Dressing in Place as Prescribed: Yes ?Has Compression in Place as Prescribed: Yes ?Pain Present Now: No ?Electronic Signature(s) ?Signed: 04/04/2021 5:06:01 PM By: Sharyn Creamer RN, BSN ?Entered By: Sharyn Creamer on 04/04/2021 10:02:14 ?-------------------------------------------------------------------------------- ?Compression Therapy Details ?Patient Name: Date of Service: ?[name], [name] 04/04/2021 9:45 A M ?Medical Record Number: [id number] ?Patient Account Number: [id number] ?Date of Birth/Sex: Treating RN: ?12/19/1925 (88 y.o. F) Deaton, Bobbi ?Primary Care Grettell Ransdell: [name] Other Clinician: ?Referring Deshanta Lady: ?Treating Seymore Brodowski/Extender: Kalman Shan ?[name], [name] ?Weeks in  Treatment: 42 ?Compression Therapy Performed for Wound Assessment: Wound #19 Right,Circumferential Lower Leg ?Performed By: Clinician Deon Pilling, RN ?Compression Type: Three Layer ?Post Procedure Diagnosis ?Same as Pre-procedure ?Electronic Signature(s) ?Signed: 04/04/2021 6:07:05 PM By: Deon Pilling RN, BSN ?Entered By: Deon Pilling on 04/04/2021 10:40:44 ?-------------------------------------------------------------------------------- ?Compression Therapy Details ?Patient Name: ?Date of Service: ?[name], [name] 04/04/2021 9:45 A M ?Medical Record Number: [id number] ?Patient Account Number: [id number] ?Date of Birth/Sex: ?Treating RN: ?November 04, 1925 (88 y.o. F) Deaton, Bobbi ?Primary Care Karmelo Bass: [name] ?Other Clinician: ?Referring Jemell Town: ?Treating Eldrick Penick/Extender: Kalman Shan ?[name], [name] ?Weeks in Treatment: 42 ?Compression Therapy Performed for Wound Assessment: Wound #7R Left,Circumferential Lower Leg ?Performed By: Clinician Deon Pilling, RN ?Compression Type: Three Layer ?Post Procedure Diagnosis ?Same as Pre-procedure ?Electronic Signature(s) ?Signed: 04/04/2021 6:07:05 PM By: Deon Pilling RN, BSN ?Entered By: Deon Pilling on 04/04/2021 10:40:44 ?-------------------------------------------------------------------------------- ?Encounter Discharge Information Details ?Patient Name: ?Date of Service: ?[name], [name] 04/04/2021 9:45 A M ?Medical Record Number: [id number] ?Patient Account Number: [id number] ?Date of Birth/Sex: ?Treating RN: ?Aug 08, 1925 (88 y.o. F) Deaton, Bobbi ?Primary Care Caralee Morea: [name] ?Other Clinician: ?Referring Seven Dollens: ?Treating Altie Savard/Extender: Kalman Shan ?[name], [name] ?Weeks in Treatment: 42 ?Encounter Discharge Information Items ?Discharge Condition: Stable ?Ambulatory Status: [name] ?Discharge Destination: Home ?Transportation: Private Auto ?Accompanied By: self ?Schedule Follow-up Appointment: Yes ?Clinical Summary  of Care: ?Electronic Signature(s) ?Signed: 04/04/2021 6:07:05 PM By: Deon Pilling RN, BSN ?Entered By: Deon Pilling on 04/04/2021 10:43:36 ?-------------------------------------------------------------------------------- ?Lower Extremity Assessment Details ?Patient Name: ?Date of Service: ?[name], [name] 04/04/2021 9:45 A M ?Medical Record Number: [id number] ?Patient Account Number: [id number] ?Date of Birth/Sex: ?Treating RN: ?Feb 19, 1925 (88 y.o. F) Deaton, Bobbi ?Primary Care Gayanne Prescott: [name] ?Other Clinician: ?Referring Jackeline Gutknecht: ?Treating Kyi Romanello/Extender: Kalman Shan ?[name], [name] ?Weeks in Treatment: 42 ?Edema Assessment ?Assessed: [Left: Yes] [Right: Yes] ?Edema: [Left: Yes] [Right: Yes] ?Calf ?Left: Right: ?Point of Measurement: 30 cm From Medial Instep  30 cm 32.3 cm ?Ankle ?Left: Right: ?Point of Measurement: 9 cm From Medial Instep 22 cm 21 cm ?Vascular Assessment ?Pulses: ?Dorsalis Pedis ?Palpable: [Left:Yes] [Right:Yes] ?Electronic Signature(s) ?Signed: 04/04/2021 6:07:05 PM By: Deon Pilling RN, BSN ?Entered By: Deon Pilling on 04/04/2021 10:19:00 ?-------------------------------------------------------------------------------- ?Multi Wound Chart Details ?Patient Name: ?Date of Service: ?[name], [name] 04/04/2021 9:45 A M ?Medical Record Number: [id number] ?Patient Account Number: [id number] ?Date of Birth/Sex: ?Treating RN: ?29-Nov-1925 (88 y.o. F) Deaton, Bobbi ?Primary Care Elizabethann Lackey: [name] ?Other Clinician: ?Referring Suzy Kugel: ?Treating Josepha Barbier/Extender: Kalman Shan ?[name], [name] ?Weeks in Treatment: 42 ?Vital Signs ?Height(in): 65 ?Pulse(bpm): 74 ?Weight(lbs): 163 ?Blood Pressure(mmHg): 126/78 ?Body Mass Index(BMI): 27.1 ?Temperature(??F): 97.4 ?Respiratory Rate(breaths/min): 18 ?Photos: [N/A:N/A] ?Right, Circumferential Lower Leg Left, Circumferential Lower Leg N/A ?Wound Location: ?Blister Blister N/A ?Wounding Event: ?Venous Leg Ulcer  Malignant Wound N/A ?Primary Etiology: ?Anemia, Arrhythmia, Congestive Heart Anemia, Arrhythmia, Congestive Heart N/A ?Comorbid History: ?Failure, Hypertension, Colitis, Failure, Hypertension, Colitis, ?Osteoarthritis Osteoarthritis ?02/06/2021 04/20/2020 N/A ?Date Acquired: ?60 42 N/A ?Weeks of Treatment: ?Open Open N/A ?Wound Status: ?No Yes N/A ?Wound Recurrence: ?Yes Yes N/A ?Clustered Wound: ?8 N/A N/A ?Clustered Quantity: ?14.7x21x0.1 1x0.4x0.2 N/A ?Measurements L x W x D (cm) ?242.452 0.314 N/A ?A (cm?) : ?rea ?24.245 0.063 N/A ?Volume (cm?) : ?52.20% 89.80% N/A ?% Reduction in Area: ?90.40% 79.50% N/A ?% Reduction in Volume: ?Full Thickness Without Exposed Full Thickness Without Exposed N/A ?Classification: ?Support Structures Support Structures ?Large Small N/A ?Exudate Amount: ?Serosanguineous Serosanguineous N/A ?Exudate Type: ?red, brown red, brown N/A ?Exudate Color: ?Distinct, outline attached Distinct, outline attached N/A ?Wound Margin: ?Large (67-100%) Large (67-100%) N/A ?Granulation Amount: ?Red, Pink Red, Pink N/A ?Granulation Quality: ?Small (1-33%) Small (1-33%) N/A ?Necrotic Amount: ?Fat Layer (Subcutaneous Tissue): Yes Fat Layer (Subcutaneous Tissue): Yes N/A ?Exposed Structures: ?Fascia: No ?Fascia: No ?Tendon: No ?Tendon: No ?Muscle: No ?Muscle: No ?Joint: No ?Joint: No ?Bone: No ?Bone: No ?Small (1-33%) Large (67-100%) N/A ?Epithelialization: ?Compression Therapy Compression Therapy N/A ?Procedures Performed: ?Treatment Notes ?Wound #19 (Lower Leg) Wound Laterality: Right, Circumferential ?Cleanser ?Soap and Water ?Discharge Instruction: May shower and wash wound with dial antibacterial soap and water prior to dressing change. ?Wound Cleanser ?Discharge Instruction: Cleanse the wound with wound cleanser prior to applying a clean dressing using gauze sponges, not tissue or cotton balls. ?Peri-Wound Care ?Triamcinolone 15 (g) ?Discharge Instruction: In clinic only. Use triamcinolone 15 (g) in  clinic only. ?Sween Lotion (Moisturizing lotion) ?Discharge Instruction: Apply moisturizing lotion as directed ?Topical ?Primary Dressing ?KerraCel Ag Gelling Fiber Dressing, 4x5 in (silver alginate) ?Discharge

## 2021-04-04 NOTE — Progress Notes (Signed)
[name] Bailie A. ([id number]) ?, ?Visit Report for 04/04/2021 ?Chief Complaint Document Details ?Patient Name: Date of Service: ?[name], [name] 04/04/2021 9:45 A M ?Medical Record Number: [id number] ?Patient Account Number: [id number] ?Date of Birth/Sex: Treating RN: ?01-21-1926 (88 y.o. F) Deaton, Bobbi ?Primary Care Provider: [name] Other Clinician: ?Referring Provider: ?Treating Provider/Extender: Kalman Shan ?[name], [name] ?Weeks in Treatment: 42 ?Information Obtained from: Patient ?Chief Complaint ?Bilateral lower extremity wounds that have been biopsied and positive for squamous cell carcinoma ?Bilateral lower extremity wounds due to chronic venous insufficiency/lymphedema ?Electronic Signature(s) ?Signed: 04/04/2021 12:24:40 PM By: Kalman Shan DO ?Entered By: Kalman Shan on 04/04/2021 12:19:21 ?-------------------------------------------------------------------------------- ?HPI Details ?Patient Name: Date of Service: ?[name], [name] 04/04/2021 9:45 A M ?Medical Record Number: [id number] ?Patient Account Number: [id number] ?Date of Birth/Sex: Treating RN: ?1925-03-22 (88 y.o. 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 She states that the wound on ?the right has been present for a year. The 1 on the left has opened up 1 month ago. She is followed with oncology for this issue as she had biopsies that ?showed squamous cell carcinoma. She is also seeing radiation oncology for  treatment options. She presents today because she would like for her wounds to be ?healed by [place]. She currently denies signs of infection. ?6/1; patient presents for 1 week follow-up. She states she has tolerated the leg wraps well. She states these do not bother her and is happy to continue with ?them. She is scheduled to see her oncologist today to go over treatment options for the bilateral lower extremity squamous cell carcinoma. Radiation is ?currently not a recommended option. Patient states she overall feels well. ?6/22; patient presents for 3-week follow-up. She has tolerated the wraps well until her last wrap where she states they were uncomfortable. She attributes this to ?the home health nurse. She denies signs of infection. She has started her first treatment of antibody infusions for her Bilateral lower extremity squamous cell ?carcinoma. She has no complaints today. ?7/21; patient presents for 1 month follow-up. Unfortunately she has not had good experience with her wrap changes with home health. She would like to do her ?own dressing changes. She continues to do her antibody infusions. She denies signs of infection. ?7/28; patient presents for 1 week follow-up. At last clinic visit she was switched to daily dressing changes due to issues with the wrap and home health placing ?them. Unfortunately she has developed weeping to her legs bilaterally. She would like to be placed in wraps today. She would also like to follow with [place] weekly ?for wrap changes instead of having home health change them. She denies signs of infection. ?8/4; patient presents for 1 week follow-up. She has tolerated the Kerlix/Coban wraps well. She no longer has weeping to her legs. She took the wrap off 1 day ?before coming in to be able to take a shower. She has no issues or complaints today. She denies signs of infection. ?8/18; patient presents for follow-up. Patient has tolerated the wraps well. She brought her Velcro  compression wraps today. She  has no issues or complaints ?today. She had her chemotherapy infusion yesterday without issues. She denies signs of infection. ?8/25; patient presents for follow-up. She used her juxta lite compressions for the past week. It is unclear if she is able to put these on correctly since she ?states she has a hard time getting them to look right. She reports 2 open wounds. She currently denies signs of infection. ?9/1; patient presents for 1 week follow-up. She has 1 open wound. She tolerated the compression wraps well. She currently denies signs of infection. ?9/8; patient presents for 1 week follow-up. She has 2 open wounds 1 on each leg. She has tolerated the compression wrap well. She currently denies signs of ?infection. ?9/15; patient presents for 1 week follow-up. She now has 3 wounds. 2 on the left and 1 on the right. She continues to tolerate the compression wrap well. She ?currently denies signs of infection. She has obtained furosemide by her primary care physician and would like to discuss when to take this. ?9/22; patient presents for 1 week follow-up. She has scattered wounds on her lower extremities bilaterally. She did take furosemide twice in the past week. She ?does not recall having to urinate more frequently. She tolerated the 3 layer compression well. She denies signs of infection. ?10/13; patient has not been here recently because of [place]. Apparently the facility was only putting gauze on her legs. This is a patient I do not normally see. ?She has a history of squamous cell carcinoma bilaterally on her anterior lower legs followed for a period of time by Dr. Ronnald Ramp at [facility] dermatology. She ?is quite convincing that she did not have radiation to her lower legs. It is likely she also has significant chronic venous insufficiency stasis dermatitis. We ?have been using silver alginate under kerlix Coban. She has obvious open areas medially on the right and areas on the  left. She also has areas of extensive ?dry flaking adherent areas on the right and to a lesser extent on the left anterior. Nodular areas on the left lateral lower leg left posterior calf and right mid calf ?medially. ?10/21; patient presents for follow-up. She has no issues or complaints today. She has tolerated the 3 layer compression well bilaterally. She denies signs of ?infection. ?10/27; patient presents for follow-up. She continues to tolerate the 3 layer compression wrap well. She denies signs of infection. ?11/30; patient presents for follow-up. She has no issues or complaints today. ?11/10; patient arrived in clinic today for nurse visit accompanied by her daughter from [place]. The daughter had multiple questions so we turned this into ?doctors visit. Apparently after the last time I saw this woman in October she went to see Dr. Ronnald Ramp but he did not take the wraps off. She previously was ?treated with Cemiplimab for her squamous cell carcinoma. She did not receive radiation to her legs. I had wanted Dr. Ronnald Ramp to look at this because of the ?exceptionally damaged skin on her lower legs bilaterally. She has odd looking wounds on the left medial lower leg also extending posteriorly which we have not ?made a lot of progress on. But she also has a raised hyperkeratotic nodules on the right anterior tibial area plaques on the left medial lower thigh. These are not ?areas that are under compression. We have been using silver alginate on any open areas Liberal TCA under 3 layer compression. ?11/17; patient presents for follow-up. She had her wraps taken off yesterday for her dermatology appointment. She  reports excessive weeping to her legs ?bilaterally after the wraps were taken off. She currently denies signs of infection. ?12/12/2020 upon evaluation today patient appears to be doing about the same in regard to her wounds. She was actually started on Cipro after having biopsies ?apparently at her dermatology  clinic she does not have the results back from the actual biopsy but the culture did return and apparently they placed her on the ?Cipro she started that just this morning. Other than that her legs appear to be doing okay

## 2021-04-11 ENCOUNTER — Other Ambulatory Visit: Payer: Self-pay

## 2021-04-11 ENCOUNTER — Encounter (HOSPITAL_BASED_OUTPATIENT_CLINIC_OR_DEPARTMENT_OTHER): Payer: Medicare Other | Admitting: Internal Medicine

## 2021-04-11 DIAGNOSIS — I87313 Chronic venous hypertension (idiopathic) with ulcer of bilateral lower extremity: Secondary | ICD-10-CM

## 2021-04-11 DIAGNOSIS — C4492 Squamous cell carcinoma of skin, unspecified: Secondary | ICD-10-CM | POA: Diagnosis not present

## 2021-04-11 DIAGNOSIS — L97812 Non-pressure chronic ulcer of other part of right lower leg with fat layer exposed: Secondary | ICD-10-CM | POA: Diagnosis not present

## 2021-04-11 DIAGNOSIS — I509 Heart failure, unspecified: Secondary | ICD-10-CM | POA: Diagnosis not present

## 2021-04-11 DIAGNOSIS — L97822 Non-pressure chronic ulcer of other part of left lower leg with fat layer exposed: Secondary | ICD-10-CM | POA: Diagnosis not present

## 2021-04-11 DIAGNOSIS — I110 Hypertensive heart disease with heart failure: Secondary | ICD-10-CM | POA: Diagnosis not present

## 2021-04-11 DIAGNOSIS — I872 Venous insufficiency (chronic) (peripheral): Secondary | ICD-10-CM | POA: Diagnosis not present

## 2021-04-11 DIAGNOSIS — I890 Lymphedema, not elsewhere classified: Secondary | ICD-10-CM | POA: Diagnosis not present

## 2021-04-11 NOTE — Progress Notes (Signed)
[name] ([id number]) ?, ?Visit Report for 04/11/2021 ?Arrival Information Details ?Patient Name: Date of Service: ?[name], [name] 04/11/2021 1:30 PM ?Medical Record Number: [id number] ?Patient Account Number: [id number] ?Date of Birth/Sex: Treating RN: ?1925/02/01 (88 y.o. F) Deaton, Bobbi ?Primary Care Jearldine Cassady: [name] Other Clinician: ?Referring Toryn Dewalt: ?Treating Delfino Friesen/Extender: Kalman Shan ?[name], [name] ?Weeks in Treatment: 58 ?Visit Information History Since Last Visit ?Added or deleted any medications: No ?Patient Arrived: [name] ?Any new allergies or adverse reactions: No ?Arrival Time: 13:45 ?Had a fall or experienced change in No ?Accompanied By: self ?activities of daily living that may affect ?Transfer Assistance: None ?risk of falls: ?Patient Identification Verified: Yes ?Signs or symptoms of abuse/neglect since last visito No ?Secondary Verification Process Completed: Yes ?Hospitalized since last visit: No ?Patient Requires Transmission-Based No ?Implantable device outside of the clinic excluding No ?Precautions: ?cellular tissue based products placed in the center ?Patient Has Alerts: Yes ?since last visit: ?Patient Alerts: R ABI = Non compressible ?Has Dressing in Place as Prescribed: Yes ?L ABI = Non compressible ?Has Compression in Place as Prescribed: Yes ?Pain Present Now: No ?Notes ?Per patient [employer] came out to measure her legs for her lymphedema pumps. Johnathon Olden made aware. ?Electronic Signature(s) ?Signed: 04/11/2021 5:33:59 PM By: Deon Pilling RN, BSN ?Entered By: Deon Pilling on 04/11/2021 14:03:21 ?-------------------------------------------------------------------------------- ?Compression Therapy Details ?Patient Name: Date of Service: ?[name], [name] 04/11/2021 1:30 PM ?Medical Record Number: [id number] ?Patient Account Number: [id number] ?Date of Birth/Sex: Treating RN: ?08-Apr-1925 (88 y.o. F) Deaton, Bobbi ?Primary Care Maxx Calaway: [name] Other Clinician: ?Referring Mykayla Brinton: ?Treating Anacleto Batterman/Extender: Kalman Shan ?[name], [name] ?Weeks in Treatment: 63 ?Compression Therapy Performed for Wound Assessment: Wound #19 Right,Circumferential Lower Leg ?Performed By: Clinician Deon Pilling, RN ?Compression Type: Three Layer ?Post Procedure Diagnosis ?Same as Pre-procedure ?Electronic Signature(s) ?Signed: 04/11/2021 5:33:59 PM By: Deon Pilling RN, BSN ?Entered By: Deon Pilling on 04/11/2021 14:08:33 ?-------------------------------------------------------------------------------- ?Compression Therapy Details ?Patient Name: ?Date of Service: ?[name], [name] 04/11/2021 1:30 PM ?Medical Record Number: [id number] ?Patient Account Number: [id number] ?Date of Birth/Sex: ?Treating RN: ?06-Nov-1925 (88 y.o. F) Deaton, Bobbi ?Primary Care Taft Worthing: [name] ?Other Clinician: ?Referring Delvon Chipps: ?Treating Astra Gregg/Extender: Kalman Shan ?[name], [name] ?Weeks in Treatment: 47 ?Compression Therapy Performed for Wound Assessment: Wound #7R Left,Lateral Lower Leg ?Performed By: Clinician Deon Pilling, RN ?Compression Type: Three Layer ?Post Procedure Diagnosis ?Same as Pre-procedure ?Electronic Signature(s) ?Signed: 04/11/2021 5:33:59 PM By: Deon Pilling RN, BSN ?Entered By: Deon Pilling on 04/11/2021 14:08:33 ?-------------------------------------------------------------------------------- ?Encounter Discharge Information Details ?Patient Name: ?Date of Service: ?[name], [name] 04/11/2021 1:30 PM ?Medical Record Number: [id number] ?Patient Account Number: [id number] ?Date of Birth/Sex: ?Treating RN: ?[birth year] (88 y.o. F) Deaton, Bobbi ?Primary Care Otis Portal: [name] ?Other Clinician: ?Referring Prue Lingenfelter: ?Treating Kathalene Sporer/Extender: Kalman Shan ?[name], [name] ?Weeks in Treatment: 57 ?Encounter Discharge Information Items ?Discharge Condition: Stable ?Ambulatory Status: [name] ?Discharge  Destination: Home ?Transportation: Private Auto ?Accompanied By: self ?Schedule Follow-up Appointment: Yes ?Clinical Summary of Care: ?Electronic Signature(s) ?Signed: 04/11/2021 5:33:59 PM By: Deon Pilling RN, BSN ?Entered By: Deon Pilling on 04/11/2021 14:09:23 ?-------------------------------------------------------------------------------- ?Lower Extremity Assessment Details ?Patient Name: ?Date of Service: ?[name], [name] 04/11/2021 1:30 PM ?Medical Record Number: [id number] ?Patient Account Number: [id number] ?Date of Birth/Sex: ?Treating RN: ?[birth year] (88 y.o. F) Deaton, Bobbi ?Primary Care Keshana Klemz: [name] ?Other Clinician: ?Referring Chasady Longwell: ?Treating Aanika Defoor/Extender: Kalman Shan ?[name], [name] ?Weeks in Treatment: 58 ?Edema Assessment ?Assessed: [Left: Yes] [Right: Yes] ?  Edema: [Left: Yes] [Right: Yes] ?Calf ?Left: Right: ?Point of Measurement: 30 cm From Medial Instep 30 cm 31 cm ?Ankle ?Left: Right: ?Point of Measurement: 9 cm From Medial Instep 22 cm 22 cm ?Vascular Assessment ?Pulses: ?Dorsalis Pedis ?Palpable: [Left:Yes] [Right:Yes] ?Electronic Signature(s) ?Signed: 04/11/2021 5:33:59 PM By: Deon Pilling RN, BSN ?Entered By: Deon Pilling on 04/11/2021 13:59:14 ?-------------------------------------------------------------------------------- ?Multi Wound Chart Details ?Patient Name: ?Date of Service: ?[name], [name] 04/11/2021 1:30 PM ?Medical Record Number: [id number] ?Patient Account Number: [id number] ?Date of Birth/Sex: ?Treating RN: ?October 23, 1925 (88 y.o. F) Deaton, Bobbi ?Primary Care Weda Baumgarner: [name] ?Other Clinician: ?Referring Jeremiah Curci: ?Treating Keyra Virella/Extender: Kalman Shan ?[name], [name] ?Weeks in Treatment: 72 ?Vital Signs ?Height(in): 65 ?Pulse(bpm): 85 ?Weight(lbs): 163 ?Blood Pressure(mmHg): 137/74 ?Body Mass Index(BMI): 27.1 ?Temperature(??F): 98.1 ?Respiratory Rate(breaths/min): 20 ?Photos: [N/A:N/A] ?Right, Circumferential  Lower Leg Left, Lateral Lower Leg N/A ?Wound Location: ?Blister Blister N/A ?Wounding Event: ?Venous Leg Ulcer Malignant Wound N/A ?Primary Etiology: ?Anemia, Arrhythmia, Congestive Heart Anemia, Arrhythmia, Congestive Heart N/A ?Comorbid History: ?Failure, Hypertension, Colitis, Failure, Hypertension, Colitis, ?Osteoarthritis Osteoarthritis ?02/06/2021 04/20/2020 N/A ?Date Acquired: ?15 43 N/A ?Weeks of Treatment: ?Open Open N/A ?Wound Status: ?No Yes N/A ?Wound Recurrence: ?Yes Yes N/A ?Clustered Wound: ?5 1 N/A ?Clustered Quantity: ?14x14x0.1 1x0.4x0.1 N/A ?Measurements L x W x D (cm) ?153.938 0.314 N/A ?A (cm?) : ?rea ?15.394 0.031 N/A ?Volume (cm?) : ?69.70% 89.80% N/A ?% Reduction in Area: ?93.90% 89.90% N/A ?% Reduction in Volume: ?Full Thickness Without Exposed Full Thickness Without Exposed N/A ?Classification: ?Support Structures Support Structures ?Medium Small N/A ?Exudate Amount: ?Serosanguineous Serosanguineous N/A ?Exudate Type: ?red, brown red, brown N/A ?Exudate Color: ?Distinct, outline attached Distinct, outline attached N/A ?Wound Margin: ?Large (67-100%) Large (67-100%) N/A ?Granulation Amount: ?Red, Pink Red, Pink N/A ?Granulation Quality: ?Small (1-33%) Small (1-33%) N/A ?Necrotic Amount: ?Fat Layer (Subcutaneous Tissue): Yes Fat Layer (Subcutaneous Tissue): Yes N/A ?Exposed Structures: ?Fascia: No ?Fascia: No ?Tendon: No ?Tendon: No ?Muscle: No ?Muscle: No ?Joint: No ?Joint: No ?Bone: No ?Bone: No ?Medium (34-66%) Large (67-100%) N/A ?Epithelialization: ?Compression Therapy Compression Therapy N/A ?Procedures Performed: ?Treatment Notes ?Wound #19 (Lower Leg) Wound Laterality: Right, Circumferential ?Cleanser ?Soap and Water ?Discharge Instruction: May shower and wash wound with dial antibacterial soap and water prior to dressing change. ?Wound Cleanser ?Discharge Instruction: Cleanse the wound with wound cleanser prior to applying a clean dressing using gauze sponges, not tissue or cotton  balls. ?Peri-Wound Care ?Triamcinolone 15 (g) ?Discharge Instruction: In clinic only. Use triamcinolone 15 (g) in clinic only. ?Sween Lotion (Moisturizing lotion) ?Discharge Instruction: Apply moisturizing lotion as di

## 2021-04-11 NOTE — Progress Notes (Signed)
Shorten [name] ([id number]) ?, ?Visit Report for 04/11/2021 ?Chief Complaint Document Details ?Patient Name: Date of Service: ?[name], [name] 04/11/2021 1:30 PM ?Medical Record Number: [id number] ?Patient Account Number: [id number] ?Date of Birth/Sex: Treating RN: ?1926-01-16 (88 y.o. F) Deaton, Bobbi ?Primary Care Provider: [name] Other Clinician: ?Referring Provider: ?Treating Provider/Extender: Kalman Shan ?[name], [name] ?Weeks in Treatment: 32 ?Information Obtained from: Patient ?Chief Complaint ?Bilateral lower extremity wounds that have been biopsied and positive for squamous cell carcinoma ?Bilateral lower extremity wounds due to chronic venous insufficiency/lymphedema ?Electronic Signature(s) ?Signed: 04/11/2021 2:37:01 PM By: Kalman Shan DO ?Entered By: Kalman Shan on 04/11/2021 14:25:29 ?-------------------------------------------------------------------------------- ?HPI Details ?Patient Name: Date of Service: ?[name], [name] 04/11/2021 1:30 PM ?Medical Record Number: [id number] ?Patient Account Number: [id number] ?Date of Birth/Sex: Treating RN: ?Nov 09, 1925 (88 y.o. F) Deaton, Bobbi ?Primary Care Provider: [name] Other Clinician: ?Referring Provider: ?Treating Provider/Extender: Kalman Shan ?[name], [name] ?Weeks in Treatment: 64 ?History of Present Illness ?Location: left leg ?HPI Description: Admission 5/23 ?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 She states that the wound on ?the right has been present for a year. The 1 on the left has opened up 1 month ago. She is followed with oncology for this issue as she had biopsies that ?showed squamous cell carcinoma. She is also seeing radiation oncology for treatment  options. She presents today because she would like for her wounds to be ?healed by [place]. She currently denies signs of infection. ?6/1; patient presents for 1 week follow-up. She states she has tolerated the leg wraps well. She states these do not bother her and is happy to continue with ?them. She is scheduled to see her oncologist today to go over treatment options for the bilateral lower extremity squamous cell carcinoma. Radiation is ?currently not a recommended option. Patient states she overall feels well. ?6/22; patient presents for 3-week follow-up. She has tolerated the wraps well until her last wrap where she states they were uncomfortable. She attributes this to ?the home health nurse. She denies signs of infection. She has started her first treatment of antibody infusions for her Bilateral lower extremity squamous cell ?carcinoma. She has no complaints today. ?7/21; patient presents for 1 month follow-up. Unfortunately she has not had good experience with her wrap changes with home health. She would like to do her ?own dressing changes. She continues to do her antibody infusions. She denies signs of infection. ?7/28; patient presents for 1 week follow-up. At last clinic visit she was switched to daily dressing changes due to issues with the wrap and home health placing ?them. Unfortunately she has developed weeping to her legs bilaterally. She would like to be placed in wraps today. She would also like to follow with [place] weekly ?for wrap changes instead of having home health change them. She denies signs of infection. ?8/4; patient presents for 1 week follow-up. She has tolerated the Kerlix/Coban wraps well. She no longer has weeping to her legs. She took the wrap off 1 day ?before coming in to be able to take a shower. She has no issues or complaints today. She denies signs of infection. ?8/18; patient presents for follow-up. Patient has tolerated the wraps well. She brought her Velcro compression wraps  today. She has no  issues or complaints ?today. She had her chemotherapy infusion yesterday without issues. She denies signs of infection. ?8/25; patient presents for follow-up. She used her juxta lite compressions for the past week. It is unclear if she is able to put these on correctly since she ?states she has a hard time getting them to look right. She reports 2 open wounds. She currently denies signs of infection. ?9/1; patient presents for 1 week follow-up. She has 1 open wound. She tolerated the compression wraps well. She currently denies signs of infection. ?9/8; patient presents for 1 week follow-up. She has 2 open wounds 1 on each leg. She has tolerated the compression wrap well. She currently denies signs of ?infection. ?9/15; patient presents for 1 week follow-up. She now has 3 wounds. 2 on the left and 1 on the right. She continues to tolerate the compression wrap well. She ?currently denies signs of infection. She has obtained furosemide by her primary care physician and would like to discuss when to take this. ?9/22; patient presents for 1 week follow-up. She has scattered wounds on her lower extremities bilaterally. She did take furosemide twice in the past week. She ?does not recall having to urinate more frequently. She tolerated the 3 layer compression well. She denies signs of infection. ?10/13; patient has not been here recently because of [place]. Apparently the facility was only putting gauze on her legs. This is a patient I do not normally see. ?She has a history of squamous cell carcinoma bilaterally on her anterior lower legs followed for a period of time by Dr. Ronnald Ramp at [facility] dermatology. She ?is quite convincing that she did not have radiation to her lower legs. It is likely she also has significant chronic venous insufficiency stasis dermatitis. We ?have been using silver alginate under kerlix Coban. She has obvious open areas medially on the right and areas on the left. She also has  areas of extensive ?dry flaking adherent areas on the right and to a lesser extent on the left anterior. Nodular areas on the left lateral lower leg left posterior calf and right mid calf ?medially. ?10/21; patient presents for follow-up. She has no issues or complaints today. She has tolerated the 3 layer compression well bilaterally. She denies signs of ?infection. ?10/27; patient presents for follow-up. She continues to tolerate the 3 layer compression wrap well. She denies signs of infection. ?11/30; patient presents for follow-up. She has no issues or complaints today. ?11/10; patient arrived in clinic today for nurse visit accompanied by her daughter from [place]. The daughter had multiple questions so we turned this into ?doctors visit. Apparently after the last time I saw this woman in October she went to see Dr. Ronnald Ramp but he did not take the wraps off. She previously was ?treated with Cemiplimab for her squamous cell carcinoma. She did not receive radiation to her legs. I had wanted Dr. Ronnald Ramp to look at this because of the ?exceptionally damaged skin on her lower legs bilaterally. She has odd looking wounds on the left medial lower leg also extending posteriorly which we have not ?made a lot of progress on. But she also has a raised hyperkeratotic nodules on the right anterior tibial area plaques on the left medial lower thigh. These are not ?areas that are under compression. We have been using silver alginate on any open areas Liberal TCA under 3 layer compression. ?11/17; patient presents for follow-up. She had her wraps taken off yesterday for her dermatology appointment. She reports excessive  weeping to her legs ?bilaterally after the wraps were taken off. She currently denies signs of infection. ?12/12/2020 upon evaluation today patient appears to be doing about the same in regard to her wounds. She was actually started on Cipro after having biopsies ?apparently at her dermatology clinic she does not  have the results back from the actual biopsy but the culture did return and apparently they placed her on the ?Cipro she started that just this morning. Other than that her legs appear to be doing okay at

## 2021-04-15 DIAGNOSIS — C44629 Squamous cell carcinoma of skin of left upper limb, including shoulder: Secondary | ICD-10-CM | POA: Diagnosis not present

## 2021-04-15 DIAGNOSIS — C44729 Squamous cell carcinoma of skin of left lower limb, including hip: Secondary | ICD-10-CM | POA: Diagnosis not present

## 2021-04-15 DIAGNOSIS — Z85828 Personal history of other malignant neoplasm of skin: Secondary | ICD-10-CM | POA: Diagnosis not present

## 2021-04-15 DIAGNOSIS — L281 Prurigo nodularis: Secondary | ICD-10-CM | POA: Diagnosis not present

## 2021-04-15 DIAGNOSIS — D485 Neoplasm of uncertain behavior of skin: Secondary | ICD-10-CM | POA: Diagnosis not present

## 2021-04-15 DIAGNOSIS — C44622 Squamous cell carcinoma of skin of right upper limb, including shoulder: Secondary | ICD-10-CM | POA: Diagnosis not present

## 2021-04-16 ENCOUNTER — Other Ambulatory Visit: Payer: Self-pay | Admitting: Oncology

## 2021-04-16 DIAGNOSIS — C44721 Squamous cell carcinoma of skin of unspecified lower limb, including hip: Secondary | ICD-10-CM

## 2021-04-16 DIAGNOSIS — E039 Hypothyroidism, unspecified: Secondary | ICD-10-CM

## 2021-04-17 ENCOUNTER — Inpatient Hospital Stay (HOSPITAL_BASED_OUTPATIENT_CLINIC_OR_DEPARTMENT_OTHER): Payer: Medicare Other | Admitting: Oncology

## 2021-04-17 ENCOUNTER — Inpatient Hospital Stay: Payer: Medicare Other

## 2021-04-17 ENCOUNTER — Other Ambulatory Visit: Payer: Self-pay

## 2021-04-17 VITALS — BP 107/60 | HR 75 | Temp 97.9°F | Resp 15 | Ht 65.0 in | Wt 146.3 lb

## 2021-04-17 DIAGNOSIS — C44721 Squamous cell carcinoma of skin of unspecified lower limb, including hip: Secondary | ICD-10-CM | POA: Diagnosis not present

## 2021-04-17 DIAGNOSIS — Z79899 Other long term (current) drug therapy: Secondary | ICD-10-CM | POA: Diagnosis not present

## 2021-04-17 DIAGNOSIS — E039 Hypothyroidism, unspecified: Secondary | ICD-10-CM | POA: Diagnosis not present

## 2021-04-17 DIAGNOSIS — Z5112 Encounter for antineoplastic immunotherapy: Secondary | ICD-10-CM | POA: Diagnosis not present

## 2021-04-17 DIAGNOSIS — C4490 Unspecified malignant neoplasm of skin, unspecified: Secondary | ICD-10-CM

## 2021-04-17 LAB — CBC WITH DIFFERENTIAL (CANCER CENTER ONLY)
Abs Immature Granulocytes: 0.01 10*3/uL (ref 0.00–0.07)
Basophils Absolute: 0 10*3/uL (ref 0.0–0.1)
Basophils Relative: 1 %
Eosinophils Absolute: 0.1 10*3/uL (ref 0.0–0.5)
Eosinophils Relative: 2 %
HCT: 36.5 % (ref 36.0–46.0)
Hemoglobin: 11.6 g/dL — ABNORMAL LOW (ref 12.0–15.0)
Immature Granulocytes: 0 %
Lymphocytes Relative: 16 %
Lymphs Abs: 0.9 10*3/uL (ref 0.7–4.0)
MCH: 33.9 pg (ref 26.0–34.0)
MCHC: 31.8 g/dL (ref 30.0–36.0)
MCV: 106.7 fL — ABNORMAL HIGH (ref 80.0–100.0)
Monocytes Absolute: 0.7 10*3/uL (ref 0.1–1.0)
Monocytes Relative: 13 %
Neutro Abs: 3.7 10*3/uL (ref 1.7–7.7)
Neutrophils Relative %: 68 %
Platelet Count: 254 10*3/uL (ref 150–400)
RBC: 3.42 MIL/uL — ABNORMAL LOW (ref 3.87–5.11)
RDW: 13.4 % (ref 11.5–15.5)
WBC Count: 5.5 10*3/uL (ref 4.0–10.5)
nRBC: 0 % (ref 0.0–0.2)

## 2021-04-17 LAB — CMP (CANCER CENTER ONLY)
ALT: 11 U/L (ref 0–44)
AST: 19 U/L (ref 15–41)
Albumin: 3.6 g/dL (ref 3.5–5.0)
Alkaline Phosphatase: 64 U/L (ref 38–126)
Anion gap: 8 (ref 5–15)
BUN: 23 mg/dL (ref 8–23)
CO2: 31 mmol/L (ref 22–32)
Calcium: 9.5 mg/dL (ref 8.9–10.3)
Chloride: 99 mmol/L (ref 98–111)
Creatinine: 1.35 mg/dL — ABNORMAL HIGH (ref 0.44–1.00)
GFR, Estimated: 37 mL/min — ABNORMAL LOW (ref >60)
Glucose, Bld: 144 mg/dL — ABNORMAL HIGH (ref 70–99)
Potassium: 4.2 mmol/L (ref 3.5–5.1)
Sodium: 138 mmol/L (ref 135–145)
Total Bilirubin: 0.9 mg/dL (ref 0.3–1.2)
Total Protein: 7.4 g/dL (ref 6.5–8.1)

## 2021-04-17 LAB — TSH: TSH: 0.804 u[IU]/mL (ref 0.308–3.960)

## 2021-04-17 MED ORDER — SODIUM CHLORIDE 0.9 % IV SOLN
350.0000 mg | Freq: Once | INTRAVENOUS | Status: AC
  Administered 2021-04-17: 350 mg via INTRAVENOUS
  Filled 2021-04-17: qty 7

## 2021-04-17 MED ORDER — SODIUM CHLORIDE 0.9 % IV SOLN: Freq: Once | INTRAVENOUS | Status: AC

## 2021-04-17 NOTE — Progress Notes (Signed)
Hematology and Oncology Follow Up Visit ? ?[name] ?[id number] ?Jun 20, 1925 88 y.o. ?04/17/2021 11:41 AM ?[name], MDPlotnikov, [name], MD  ? ?Principle Diagnosis: 88 year old with squamous cell carcinoma of the lower extremity diagnosed in June 2022.  She had locally advanced disease status and resectable. ? ? ?Prior Therapy: ? ?She received Libtayo for 5 cycles concluded in September 2022. ? ?Current therapy: ? ?Libtayo 350 mg total dose restarted on March 26, 2021.  She is here for the next cycle of therapy. ? ?Interim History: [name] returns today for follow-up visit.  Since the last visit, she received the first cycle of Libtayo without any major complications.  She denies any nausea, fatigue or skin rash.  She denies any GI toxicity or pulmonary complaints.  She continues to be evaluated at the wound clinic on a weekly basis. ? ? ? ? ?Medications: I have reviewed the patient's current medications.  ?Current Outpatient Medications  ?Medication Sig Dispense Refill  ? acetaminophen (TYLENOL) 500 MG tablet Take 500 mg by mouth daily as needed for mild pain.     ? apixaban (ELIQUIS) 5 MG TABS tablet TAKE 1 TABLET BY MOUTH TWICE DAILY 60 tablet 5  ? B Complex-C (B-COMPLEX WITH VITAMIN C) tablet Take 1 tablet by mouth daily.    ? busPIRone (BUSPAR) 15 MG tablet TAKE 1 TABLET BY MOUTH TWICE DAILY 180 tablet 3  ? Cholecalciferol (VITAMIN D3) 1000 UNITS tablet Take 1,000 Units by mouth daily.    ? colestipol (COLESTID) 1 g tablet Take 1 tablet (1 g total) by mouth in the morning. 60 tablet 12  ? diltiazem (CARDIZEM CD) 120 MG 24 hr capsule TAKE 1 CAPSULE BY MOUTH ONCE DAILY 30 capsule 11  ? doxycycline (VIBRA-TABS) 100 MG tablet TAKE 1 TABLET BY MOUTH ONCE DAILY 30 tablet 11  ? famotidine (PEPCID) 20 MG tablet Take 20 mg by mouth daily.    ? furosemide (LASIX) 40 MG tablet Take 1 tablet (40 mg total) by mouth 2 (two) times daily. 60 tablet 5  ? gabapentin (NEURONTIN) 100 MG capsule Take 1 capsule  (100 mg total) by mouth at bedtime. Pain 90 capsule 3  ? hydrocortisone 2.5 % lotion Apply topically 3 (three) times daily. 240 mL 2  ? levothyroxine (SYNTHROID) 25 MCG tablet TAKE 1 TABLET BY MOUTH ONCE DAILY FOR THYROID *TAKE ON AN EMPTY STOMACH* 30 tablet 11  ? linaclotide (LINZESS) 290 MCG CAPS capsule TAKE 1 TABLET BY MOUTH ONCE DAILY *TAKE 30 MINUTES PRIOR TO FIRST MEAL* *TAKE ON AN EMPTY STOMACH* *DO NOT CRUSH OR CHEW* 30 capsule 5  ? loperamide (IMODIUM) 2 MG capsule Take 1 capsule (2 mg total) by mouth as needed for diarrhea or loose stools. Take 1 tab with first loose stool then one tab after each loose stool for maximum dose of 8 tabs daily. Pt to call the office if need for greater then 8 tabs a day 60 capsule 0  ? methylPREDNISolone (MEDROL DOSEPAK) 4 MG TBPK tablet As directed 21 tablet 0  ? metoprolol succinate (TOPROL-XL) 50 MG 24 hr tablet TAKE 1/2 TABLET = 25 MG BY MOUTH TWICE DAILY. TAKE WITH OR IMMEDIATELY FOLLOWING A MEAL 30 tablet 10  ? Multiple Vitamins-Minerals (MULTIVITAMIN WITH MINERALS) tablet Take 1 tablet by mouth daily.    ? polyethylene glycol (MIRALAX) 17 g packet Take 17 g by mouth daily. 30 each 0  ? potassium chloride SA (KLOR-CON M) 20 MEQ tablet TAKE 1 TABLET BY MOUTH  ONCE DAILY  ALONG W/ FUROSEMIDE AS NEED *TAKE WITH FOOD* *DO NOT CRUSH OR CHEW* *MAY DISSOLVE* 30 tablet 11  ? potassium chloride SA (KLOR-CON M) 20 MEQ tablet Take 1 tablet (20 mEq total) by mouth daily. Take along w/ Furosemide as need 30 tablet 3  ? prochlorperazine (COMPAZINE) 5 MG tablet Take 1 tablet (5 mg total) by mouth every 8 (eight) hours as needed for nausea or vomiting. 20 tablet 1  ? temazepam (RESTORIL) 15 MG capsule TAKE 1 CAPSULE BY MOUTH EVERY NIGHT AT BEDTIME AS NEEDED FOR SLEEP 30 capsule 3  ? ?No current facility-administered medications for this visit.  ? ? ? ?Allergies:  ?Allergies  ?Allergen Reactions  ? Zosyn [Piperacillin Sod-Tazobactam So] Hives, Itching, Rash and Other (See Comments)  ?   Morbilliform eruptions with itching- looks like Measles  ? Atorvastatin   ?  myalgias  ? Atenolol Nausea And Vomiting  ? Clarithromycin Nausea And Vomiting  ? Codeine Sulfate Nausea Only  ? Levaquin [Levofloxacin] Nausea Only  ? Macrodantin Other (See Comments)  ?  UNSPECIFIED   ? Oxycodone-Acetaminophen Nausea And Vomiting  ? ? ? ? ?Physical Exam: ?Blood pressure 107/60, pulse 75, temperature 97.9 ?F (36.6 ?C), temperature source Temporal, resp. rate 15, height '5\' 5"'$  (1.651 m), weight 146 lb 4.8 oz (66.4 kg), SpO2 98 %. ? ?ECOG: 2 ? ? ?General appearance: Comfortable appearing without any discomfort ?Head: Normocephalic without any trauma ?Oropharynx: Mucous membranes are moist and pink without any thrush or ulcers. ?Eyes: Pupils are equal and round reactive to light. ?Lymph nodes: No cervical, supraclavicular, inguinal or axillary lymphadenopathy.   ?Heart:regular rate and rhythm.  S1 and S2 without leg edema. ?Lung: Clear without any rhonchi or wheezes.  No dullness to percussion. ?Abdomin: Soft, nontender, nondistended with good bowel sounds.  No hepatosplenomegaly. ?Musculoskeletal: No joint deformity or effusion.  Full range of motion noted. ?Neurological: No deficits noted on motor, sensory and deep tendon reflex exam. ?Skin: Bilateral lower extremity wrapping noted with mild edema. ? ? ? ?Lab Results: ?Lab Results  ?Component Value Date  ? WBC 5.5 04/17/2021  ? HGB 11.6 (L) 04/17/2021  ? HCT 36.5 04/17/2021  ? MCV 106.7 (H) 04/17/2021  ? PLT 254 04/17/2021  ? ?  Chemistry   ?   ?Component Value Date/Time  ? NA 138 03/26/2021 1530  ? NA 134 08/11/2018 1425  ? NA 137 07/28/2016 1334  ? K 4.0 03/26/2021 1530  ? K 3.8 07/28/2016 1334  ? CL 103 03/26/2021 1530  ? CO2 28 03/26/2021 1530  ? CO2 28 07/28/2016 1334  ? BUN 21 03/26/2021 1530  ? BUN 20 08/11/2018 1425  ? BUN 9.5 07/28/2016 1334  ? CREATININE 0.97 03/26/2021 1530  ? CREATININE 1.0 07/28/2016 1334  ?    ?Component Value Date/Time  ? CALCIUM 9.2  03/26/2021 1530  ? CALCIUM 9.6 07/28/2016 1334  ? ALKPHOS 59 03/26/2021 1530  ? ALKPHOS 96 07/28/2016 1334  ? AST 18 03/26/2021 1530  ? AST 21 07/28/2016 1334  ? ALT 11 03/26/2021 1530  ? ALT 24 07/28/2016 1334  ? BILITOT 0.6 03/26/2021 1530  ? BILITOT 1.21 (H) 07/28/2016 1334  ?  ? ? ? ? ? ?Impression and Plan: ? ?88 year old with: ? ?1.  Locally advanced squamous cell carcinoma of the lower extremities diagnosed in June 2022.   She had received immunotherapy in September 2022 and has relapsed disease in 2023. ? ? ?The natural course of this disease was reviewed  at this time.  Treatment choices were reiterated.  She has tolerated Libtayo reasonably well without any complications.  I recommended the same doses scheduled for the time being and will continue to monitor her skin lesions periodically.  She is not a candidate for any further surgery or radiation at this time.  Laboratory data from today reviewed and she is agreeable to proceed. ? ? ?  ?2.  Breast cancer: He is status post surgery and endocrine adjuvant therapy.  No additional treatment is needed. ? ?3.  Bilateral lower extremity wounds: She continues to follow at the wound care clinic without any recent infections noted. ? ?  ?  ?4.  Follow-up: Every 3 weeks for Libtayo infusion. ?  ?  ? 30 minutes were spent on this encounter.  The time was dedicated to reviewing laboratory data, disease status update, treatment choices and complications related to therapy. ? ? ?Zola Button, MD ?3/29/202311:41 AM ? ?

## 2021-04-17 NOTE — Patient Instructions (Signed)
[facility]  Discharge Instructions: Thank you for choosing [facility] to provide your oncology and hematology care.   If you have a lab appointment with the Cancer Center, please go directly to the Cancer Center and check in at the registration area.   Wear comfortable clothing and clothing appropriate for easy access to any Portacath or PICC line.   We strive to give you quality time with your provider. You may need to reschedule your appointment if you arrive late (15 or more minutes).  Arriving late affects you and other patients whose appointments are after yours.  Also, if you miss three or more appointments without notifying the office, you may be dismissed from the clinic at the provider's discretion.      For prescription refill requests, have your pharmacy contact our office and allow 72 hours for refills to be completed.    Today you received the following chemotherapy and/or immunotherapy agents :  Libtayo      To help prevent nausea and vomiting after your treatment, we encourage you to take your nausea medication as directed.  BELOW ARE SYMPTOMS THAT SHOULD BE REPORTED IMMEDIATELY: *FEVER GREATER THAN 100.4 F (38 C) OR HIGHER *CHILLS OR SWEATING *NAUSEA AND VOMITING THAT IS NOT CONTROLLED WITH YOUR NAUSEA MEDICATION *UNUSUAL SHORTNESS OF BREATH *UNUSUAL BRUISING OR BLEEDING *URINARY PROBLEMS (pain or burning when urinating, or frequent urination) *BOWEL PROBLEMS (unusual diarrhea, constipation, pain near the anus) TENDERNESS IN MOUTH AND THROAT WITH OR WITHOUT PRESENCE OF ULCERS (sore throat, sores in mouth, or a toothache) UNUSUAL RASH, SWELLING OR PAIN  UNUSUAL VAGINAL DISCHARGE OR ITCHING   Items with * indicate a potential emergency and should be followed up as soon as possible or go to the Emergency Department if any problems should occur.  Please show the CHEMOTHERAPY ALERT CARD or IMMUNOTHERAPY ALERT CARD at check-in to  the Emergency Department and triage nurse.  Should you have questions after your visit or need to cancel or reschedule your appointment, please contact [facility]  Dept: [phone]  and follow the prompts.  Office hours are 8:00 a.m. to 4:30 p.m. Monday - Friday. Please note that voicemails left after 4:00 p.m. may not be returned until the following business day.  We are closed weekends and major holidays. You have access to a nurse at all times for urgent questions. Please call the main number to the clinic Dept: [phone] and follow the prompts.   For any non-urgent questions, you may also contact your provider using MyChart. We now offer e-Visits for anyone 18 and older to request care online for non-urgent symptoms. For details visit [email or web address].   Also download the MyChart app! Go to the app store, search "MyChart", open the app, select [facility], and log in with your MyChart username and password.  Due to Covid, a mask is required upon entering the hospital/clinic. If you do not have a mask, one will be given to you upon arrival. For doctor visits, patients may have 1 support person aged 18 or older with them. For treatment visits, patients cannot have anyone with them due to current Covid guidelines and our immunocompromised population.   

## 2021-04-18 ENCOUNTER — Encounter (HOSPITAL_BASED_OUTPATIENT_CLINIC_OR_DEPARTMENT_OTHER): Payer: Medicare Other | Admitting: Internal Medicine

## 2021-04-18 ENCOUNTER — Telehealth: Payer: Self-pay

## 2021-04-18 ENCOUNTER — Encounter: Payer: Self-pay | Admitting: Oncology

## 2021-04-18 DIAGNOSIS — L97812 Non-pressure chronic ulcer of other part of right lower leg with fat layer exposed: Secondary | ICD-10-CM | POA: Diagnosis not present

## 2021-04-18 DIAGNOSIS — C4492 Squamous cell carcinoma of skin, unspecified: Secondary | ICD-10-CM | POA: Diagnosis not present

## 2021-04-18 DIAGNOSIS — L97822 Non-pressure chronic ulcer of other part of left lower leg with fat layer exposed: Secondary | ICD-10-CM

## 2021-04-18 DIAGNOSIS — I872 Venous insufficiency (chronic) (peripheral): Secondary | ICD-10-CM | POA: Diagnosis not present

## 2021-04-18 DIAGNOSIS — I110 Hypertensive heart disease with heart failure: Secondary | ICD-10-CM | POA: Diagnosis not present

## 2021-04-18 DIAGNOSIS — I890 Lymphedema, not elsewhere classified: Secondary | ICD-10-CM

## 2021-04-18 DIAGNOSIS — I509 Heart failure, unspecified: Secondary | ICD-10-CM | POA: Diagnosis not present

## 2021-04-18 NOTE — Progress Notes (Signed)
[name] Kelsy A. ([id number]) ?, ?Visit Report for 04/18/2021 ?Chief Complaint Document Details ?Patient Name: Date of Service: ?[name] ETT, [name] 04/18/2021 1:30 PM ?Medical Record Number: [id number] ?Patient Account Number: [id number] ?Date of Birth/Sex: Treating RN: ?Aug 17, 1925 (88 y.o. F) Deaton, Bobbi ?Primary Care Provider: [name] Other Clinician: ?Referring Provider: ?Treating Provider/Extender: Kalman Shan ?[name], [name] ?Weeks in Treatment: 44 ?Information Obtained from: Patient ?Chief Complaint ?Bilateral lower extremity wounds that have been biopsied and positive for squamous cell carcinoma ?Bilateral lower extremity wounds due to chronic venous insufficiency/lymphedema ?Electronic Signature(s) ?Signed: 04/18/2021 2:33:05 PM By: Kalman Shan DO ?Entered By: Kalman Shan on 04/18/2021 14:29:02 ?-------------------------------------------------------------------------------- ?HPI Details ?Patient Name: Date of Service: ?[name] ETT, Annah A. 04/18/2021 1:30 PM ?Medical Record Number: [id number] ?Patient Account Number: [id number] ?Date of Birth/Sex: Treating RN: ?1925-12-04 (88 y.o. 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 She states that the wound on ?the right has been present for a year. The 1 on the left has opened up 1 month ago. She is followed with oncology for this issue as she had biopsies that ?showed squamous cell carcinoma. She is also seeing radiation oncology for treatment  options. She presents today because she would like for her wounds to be ?healed by [place]. She currently denies signs of infection. ?6/1; patient presents for 1 week follow-up. She states she has tolerated the leg wraps well. She states these do not bother her and is happy to continue with ?them. She is scheduled to see her oncologist today to go over treatment options for the bilateral lower extremity squamous cell carcinoma. Radiation is ?currently not a recommended option. Patient states she overall feels well. ?6/22; patient presents for 3-week follow-up. She has tolerated the wraps well until her last wrap where she states they were uncomfortable. She attributes this to ?the home health nurse. She denies signs of infection. She has started her first treatment of antibody infusions for her Bilateral lower extremity squamous cell ?carcinoma. She has no complaints today. ?7/21; patient presents for 1 month follow-up. Unfortunately she has not had good experience with her wrap changes with home health. She would like to do her ?own dressing changes. She continues to do her antibody infusions. She denies signs of infection. ?7/28; patient presents for 1 week follow-up. At last clinic visit she was switched to daily dressing changes due to issues with the wrap and home health placing ?them. Unfortunately she has developed weeping to her legs bilaterally. She would like to be placed in wraps today. She would also like to follow with [place] weekly ?for wrap changes instead of having home health change them. She denies signs of infection. ?8/4; patient presents for 1 week follow-up. She has tolerated the Kerlix/Coban wraps well. She no longer has weeping to her legs. She took the wrap off 1 day ?before coming in to be able to take a shower. She has no issues or complaints today. She denies signs of infection. ?8/18; patient presents for follow-up. Patient has tolerated the wraps well. She brought her Velcro compression wraps  today. She has no  issues or complaints ?today. She had her chemotherapy infusion yesterday without issues. She denies signs of infection. ?8/25; patient presents for follow-up. She used her juxta lite compressions for the past week. It is unclear if she is able to put these on correctly since she ?states she has a hard time getting them to look right. She reports 2 open wounds. She currently denies signs of infection. ?9/1; patient presents for 1 week follow-up. She has 1 open wound. She tolerated the compression wraps well. She currently denies signs of infection. ?9/8; patient presents for 1 week follow-up. She has 2 open wounds 1 on each leg. She has tolerated the compression wrap well. She currently denies signs of ?infection. ?9/15; patient presents for 1 week follow-up. She now has 3 wounds. 2 on the left and 1 on the right. She continues to tolerate the compression wrap well. She ?currently denies signs of infection. She has obtained furosemide by her primary care physician and would like to discuss when to take this. ?9/22; patient presents for 1 week follow-up. She has scattered wounds on her lower extremities bilaterally. She did take furosemide twice in the past week. She ?does not recall having to urinate more frequently. She tolerated the 3 layer compression well. She denies signs of infection. ?10/13; patient has not been here recently because of [place]. Apparently the facility was only putting gauze on her legs. This is a patient I do not normally see. ?She has a history of squamous cell carcinoma bilaterally on her anterior lower legs followed for a period of time by Dr. Ronnald Ramp at [facility] dermatology. She ?is quite convincing that she did not have radiation to her lower legs. It is likely she also has significant chronic venous insufficiency stasis dermatitis. We ?have been using silver alginate under kerlix Coban. She has obvious open areas medially on the right and areas on the left. She also has  areas of extensive ?dry flaking adherent areas on the right and to a lesser extent on the left anterior. Nodular areas on the left lateral lower leg left posterior calf and right mid calf ?medially. ?10/21; patient presents for follow-up. She has no issues or complaints today. She has tolerated the 3 layer compression well bilaterally. She denies signs of ?infection. ?10/27; patient presents for follow-up. She continues to tolerate the 3 layer compression wrap well. She denies signs of infection. ?11/30; patient presents for follow-up. She has no issues or complaints today. ?11/10; patient arrived in clinic today for nurse visit accompanied by her daughter from [place]. The daughter had multiple questions so we turned this into ?doctors visit. Apparently after the last time I saw this woman in October she went to see Dr. Ronnald Ramp but he did not take the wraps off. She previously was ?treated with Cemiplimab for her squamous cell carcinoma. She did not receive radiation to her legs. I had wanted Dr. Ronnald Ramp to look at this because of the ?exceptionally damaged skin on her lower legs bilaterally. She has odd looking wounds on the left medial lower leg also extending posteriorly which we have not ?made a lot of progress on. But she also has a raised hyperkeratotic nodules on the right anterior tibial area plaques on the left medial lower thigh. These are not ?areas that are under compression. We have been using silver alginate on any open areas Liberal TCA under 3 layer compression. ?11/17; patient presents for follow-up. She had her wraps taken off yesterday for her dermatology appointment. She reports excessive  weeping to her legs ?bilaterally after the wraps were taken off. She currently denies signs of infection. ?12/12/2020 upon evaluation today patient appears to be doing about the same in regard to her wounds. She was actually started on Cipro after having biopsies ?apparently at her dermatology clinic she does not  have the results back from the actual biopsy but the culture did return and apparently they placed her on the ?Cipro she started that just this morning. Other than that her legs appear to be doing okay at

## 2021-04-18 NOTE — Telephone Encounter (Signed)
Called and spoke with pt's daughter, [name], regarding her My Chart message. Relayed Dr Hazeline Junker recommendations,explained the reasons for 2hr time frame and that she will contacted via phone at the next [place].  Daughter verbalized understanding ?

## 2021-04-19 NOTE — Progress Notes (Signed)
Plascencia Placida A. ([id number]) ?, ?Visit Report for 04/18/2021 ?Arrival Information Details ?Patient Name: Date of Service: ?[name], Aliannah A. 04/18/2021 1:30 PM ?Medical Record Number: [id number] ?Patient Account Number: [id number] ?Date of Birth/Sex: Treating RN: ?1925-10-05 (88 y.o. F) Deaton, Bobbi ?Primary Care Maysel Mccolm: [name] ?Other Clinician: Donavan Burnet ?Referring Dennie Moltz: ?Treating Dalisha Shively/Extender: Kalman Shan ?[name], [name] ?Weeks in Treatment: 44 ?Visit Information History Since Last Visit ?All ordered tests and consults were completed: Yes ?Patient Arrived: [name] ?Added or deleted any medications: No ?Arrival Time: 13:51 ?Any new allergies or adverse reactions: No ?Accompanied By: self ?Had a fall or experienced change in No ?Transfer Assistance: None ?activities of daily living that may affect ?Patient Identification Verified: Yes ?risk of falls: ?Secondary Verification Process Completed: Yes ?Signs or symptoms of abuse/neglect since last visito No ?Patient Requires Transmission-Based No ?Hospitalized since last visit: No ?Precautions: ?Implantable device outside of the clinic excluding No ?Patient Has Alerts: Yes ?cellular tissue based products placed in the center ?Patient Alerts: R ABI = Non compressible since last visit: ?L ABI = Non compressible ?Pain Present Now: No ?Notes ?patient had received 2nd cancer treatment infusion for the skin cancer areas noted to leg. Per patient seen dermatology yesterday and has ordered three more ?creams for her upper thighs. ?Electronic Signature(s) ?Signed: 04/18/2021 4:54:01 PM By: Deon Pilling RN, BSN ?Entered By: Deon Pilling on 04/18/2021 14:22:12 ?-------------------------------------------------------------------------------- ?Compression Therapy Details ?Patient Name: Date of Service: ?[name], Kelsea A. 04/18/2021 1:30 PM ?Medical Record Number: [id number] ?Patient Account Number: [id number] ?Date of Birth/Sex: Treating  RN: ?Dec 21, 1925 (88 y.o. F) Deaton, Bobbi ?Primary Care Royden Bulman: [name] Other Clinician: ?Referring Maddisen Vought: ?Treating Mackensie Pilson/Extender: Kalman Shan ?[name], [name] ?Weeks in Treatment: 44 ?Compression Therapy Performed for Wound Assessment: Wound #19 Right,Circumferential Lower Leg ?Performed By: Clinician Deon Pilling, RN ?Compression Type: Three Layer ?Post Procedure Diagnosis ?Same as Pre-procedure ?Electronic Signature(s) ?Signed: 04/18/2021 4:54:01 PM By: Deon Pilling RN, BSN ?Entered By: Deon Pilling on 04/18/2021 14:26:44 ?-------------------------------------------------------------------------------- ?Compression Therapy Details ?Patient Name: ?Date of Service: ?[name], Tionne A. 04/18/2021 1:30 PM ?Medical Record Number: [id number] ?Patient Account Number: [id number] ?Date of Birth/Sex: ?Treating RN: ?Oct 14, 1925 (88 y.o. F) Deaton, Bobbi ?Primary Care Alizia Greif: [name] ?Other Clinician: ?Referring Jaeli Grubb: ?Treating Mallory Schaad/Extender: Kalman Shan ?[name], [name] ?Weeks in Treatment: 44 ?Compression Therapy Performed for Wound Assessment: Wound #7R Left,Lateral Lower Leg ?Performed By: Clinician Deon Pilling, RN ?Compression Type: Three Layer ?Post Procedure Diagnosis ?Same as Pre-procedure ?Electronic Signature(s) ?Signed: 04/18/2021 4:54:01 PM By: Deon Pilling RN, BSN ?Entered By: Deon Pilling on 04/18/2021 14:26:44 ?-------------------------------------------------------------------------------- ?Lower Extremity Assessment Details ?Patient Name: ?Date of Service: ?[name], Enrique A. 04/18/2021 1:30 PM ?Medical Record Number: [id number] ?Patient Account Number: [id number] ?Date of Birth/Sex: ?Treating RN: ?November 04, 1925 (88 y.o. F) Deaton, Bobbi ?Primary Care Emme Rosenau: [name] ?Other Clinician: ?Referring Leathia Farnell: ?Treating Heer Justiss/Extender: Kalman Shan ?[name], [name] ?Weeks in Treatment: 44 ?Edema Assessment ?Assessed: [Left: Yes]  [Right: Yes] ?Edema: [Left: No] [Right: No] ?Calf ?Left: Right: ?Point of Measurement: 30 cm From Medial Instep 29 cm 30 cm ?Ankle ?Left: Right: ?Point of Measurement: 9 cm From Medial Instep 22 cm 20 cm ?Vascular Assessment ?Pulses: ?Dorsalis Pedis ?Palpable: [Left:Yes] [Right:Yes] ?Electronic Signature(s) ?Signed: 04/18/2021 4:54:01 PM By: Deon Pilling RN, BSN ?Entered By: Deon Pilling on 04/18/2021 14:19:07 ?-------------------------------------------------------------------------------- ?Multi Wound Chart Details ?Patient Name: ?Date of Service: ?[name], Tikesha A. 04/18/2021 1:30 PM ?Medical Record Number: [id number] ?Patient Account Number: [id number] ?Date of Birth/Sex: ?Treating RN: ?11-Oct-1925 (88  y.o. F) Deaton, Bobbi ?Primary Care Vaani Morren: [name] ?Other Clinician: ?Referring Aziya Arena: ?Treating Brainard Highfill/Extender: Kalman Shan ?[name], [name] ?Weeks in Treatment: 44 ?Vital Signs ?Height(in): 65 ?Pulse(bpm): 64 ?Weight(lbs): 163 ?Blood Pressure(mmHg): 134/72 ?Body Mass Index(BMI): 27.1 ?Temperature(??F): 98.6 ?Respiratory Rate(breaths/min): 18 ?Photos: [19:No Photos Right, Circumferential Lower Leg] [7R:No Photos Left, Lateral Lower Leg] [N/A:N/A N/A] ?Wound Location: [19:Blister] [7R:Blister] [N/A:N/A] ?Wounding Event: [19:Venous Leg Ulcer] [7R:Malignant Wound] [N/A:N/A] ?Primary Etiology: [19:Anemia, Arrhythmia, Congestive Heart Anemia, Arrhythmia, Congestive Heart N/A] ?Comorbid History: [19:Failure, Hypertension, Colitis, Osteoarthritis 02/06/2021] [7R:Failure, Hypertension, Colitis, Osteoarthritis 04/20/2020] [N/A:N/A] ?Date Acquired: [19:10] [7R:44] [N/A:N/A] ?Weeks of Treatment: [19:Open] [7R:Open] [N/A:N/A] ?Wound Status: [19:No] [7R:Yes] [N/A:N/A] ?Wound Recurrence: [19:Yes] [7R:Yes] [N/A:N/A] ?Clustered Wound: [19:5] [7R:1] [N/A:N/A] ?Clustered Quantity: [19:14x4x0.1] [7R:0.1x0.1x0.1] [N/A:N/A] ?Measurements L x W x D (cm) [19:43.982] [1D:1.761] [N/A:N/A] ?A (cm?) : ?rea  [60:7.371] [7R:0.001] [N/A:N/A] ?Volume (cm?) : [19:91.30%] [7R:99.70%] [N/A:N/A] ?% Reduction in Area: [19:98.30%] [7R:99.70%] [N/A:N/A] ?% Reduction in Volume: [19:Full Thickness Without Exposed] [7R:Full Thickness Without Exposed] [N/A:N/A] ?Classification: [19:Support Structures Medium] [7R:Support Structures None Present] [N/A:N/A] ?Exudate Amount: [19:Serosanguineous] [7R:N/A] [N/A:N/A] ?Exudate Type: [19:red, brown] [7R:N/A] [N/A:N/A] ?Exudate Color: [19:Distinct, outline attached] [7R:Distinct, outline attached] [N/A:N/A] ?Wound Margin: [19:Large (67-100%)] [7R:None Present (0%)] [N/A:N/A] ?Granulation Amount: [19:Red, Pink] [7R:N/A] [N/A:N/A] ?Granulation Quality: [19:None Present (0%)] [7R:None Present (0%)] [N/A:N/A] ?Necrotic Amount: ?[19:Fat Layer (Subcutaneous Tissue): Yes Fat Layer (Subcutaneous Tissue): Yes N/A] ?Exposed Structures: ?[19:Fascia: No Tendon: No Muscle: No Joint: No Bone: No Large (67-100%)] [7R:Fascia: No Tendon: No Muscle: No Joint: No Bone: No Large (67-100%)] [N/A:N/A] ?Epithelialization: [19:Compression Therapy] [7R:Compression Therapy] [N/A:N/A] ?Treatment Notes ?Electronic Signature(s) ?Signed: 04/18/2021 2:33:05 PM By: Kalman Shan DO ?Signed: 04/18/2021 4:54:01 PM By: Deon Pilling RN, BSN ?Entered By: Kalman Shan on 04/18/2021 14:28:48 ?-------------------------------------------------------------------------------- ?Multi-Disciplinary Care Plan Details ?Patient Name: ?Date of Service: ?[name], Alvaretta A. 04/18/2021 1:30 PM ?Medical Record Number: [id number] ?Patient Account Number: [id number] ?Date of Birth/Sex: ?Treating RN: ?1925-12-08 (88 y.o. F) Deaton, Bobbi ?Primary Care Kaysha Parsell: [name] ?Other Clinician: ?Referring Ragen Laver: ?Treating Marily Konczal/Extender: Kalman Shan ?[name], [name] ?Weeks in Treatment: 44 ?Multidisciplinary Care Plan reviewed with physician ?Active Inactive ?Venous Leg Ulcer ?Nursing Diagnoses: ?Knowledge deficit related to  disease process and management ?Potential for venous Insuffiency (use before diagnosis confirmed) ?Goals: ?Patient will maintain optimal edema control ?Date Initiated: 11/01/2020 ?Target Resolution Date: 05/17/2021 ?Goal Sta

## 2021-04-25 ENCOUNTER — Encounter (HOSPITAL_BASED_OUTPATIENT_CLINIC_OR_DEPARTMENT_OTHER): Payer: Medicare Other | Attending: Internal Medicine | Admitting: Internal Medicine

## 2021-04-25 DIAGNOSIS — L97822 Non-pressure chronic ulcer of other part of left lower leg with fat layer exposed: Secondary | ICD-10-CM | POA: Diagnosis not present

## 2021-04-25 DIAGNOSIS — L97812 Non-pressure chronic ulcer of other part of right lower leg with fat layer exposed: Secondary | ICD-10-CM | POA: Diagnosis not present

## 2021-04-25 DIAGNOSIS — I872 Venous insufficiency (chronic) (peripheral): Secondary | ICD-10-CM | POA: Insufficient documentation

## 2021-04-25 DIAGNOSIS — E039 Hypothyroidism, unspecified: Secondary | ICD-10-CM | POA: Insufficient documentation

## 2021-04-25 DIAGNOSIS — Z79899 Other long term (current) drug therapy: Secondary | ICD-10-CM | POA: Insufficient documentation

## 2021-04-25 DIAGNOSIS — I87313 Chronic venous hypertension (idiopathic) with ulcer of bilateral lower extremity: Secondary | ICD-10-CM | POA: Insufficient documentation

## 2021-04-25 DIAGNOSIS — C4492 Squamous cell carcinoma of skin, unspecified: Secondary | ICD-10-CM | POA: Diagnosis not present

## 2021-04-25 DIAGNOSIS — I890 Lymphedema, not elsewhere classified: Secondary | ICD-10-CM | POA: Insufficient documentation

## 2021-04-25 NOTE — Progress Notes (Addendum)
Friebel [name] ([id number]) ?, ?Visit Report for 04/25/2021 ?Chief Complaint Document Details ?Patient Name: Date of Service: ?[name], Monserrat A. 04/25/2021 1:30 PM ?Medical Record Number: [id number] ?Patient Account Number: [id number] ?Date of Birth/Sex: Treating RN: ?01/24/1925 (88 y.o. F) Deaton, Bobbi ?Primary Care Provider: [name] Other Clinician: ?Referring Provider: ?Treating Provider/Extender: Kalman Shan ?[name], [name] ?Weeks in Treatment: 70 ?Information Obtained from: Patient ?Chief Complaint ?Bilateral lower extremity wounds that have been biopsied and positive for squamous cell carcinoma ?Bilateral lower extremity wounds due to chronic venous insufficiency/lymphedema ?Electronic Signature(s) ?Signed: 04/25/2021 4:54:00 PM By: Kalman Shan DO ?Entered By: Kalman Shan on 04/25/2021 16:33:36 ?-------------------------------------------------------------------------------- ?HPI Details ?Patient Name: Date of Service: ?[name], [name] 04/25/2021 1:30 PM ?Medical Record Number: [id number] ?Patient Account Number: [id number] ?Date of Birth/Sex: Treating RN: ?06/30/1925 (88 y.o. 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 She states that the wound on ?the right has been present for a year. The 1 on the left has opened up 1 month ago. She is followed with oncology for this issue as she had biopsies that ?showed squamous cell carcinoma. She is also seeing radiation oncology for treatment  options. She presents today because she would like for her wounds to be ?healed by [place]. She currently denies signs of infection. ?6/1; patient presents for 1 week follow-up. She states she has tolerated the leg wraps well. She states these do not bother her and is happy to continue with ?them. She is scheduled to see her oncologist today to go over treatment options for the bilateral lower extremity squamous cell carcinoma. Radiation is ?currently not a recommended option. Patient states she overall feels well. ?6/22; patient presents for 3-week follow-up. She has tolerated the wraps well until her last wrap where she states they were uncomfortable. She attributes this to ?the home health nurse. She denies signs of infection. She has started her first treatment of antibody infusions for her Bilateral lower extremity squamous cell ?carcinoma. She has no complaints today. ?7/21; patient presents for 1 month follow-up. Unfortunately she has not had good experience with her wrap changes with home health. She would like to do her ?own dressing changes. She continues to do her antibody infusions. She denies signs of infection. ?7/28; patient presents for 1 week follow-up. At last clinic visit she was switched to daily dressing changes due to issues with the wrap and home health placing ?them. Unfortunately she has developed weeping to her legs bilaterally. She would like to be placed in wraps today. She would also like to follow with [place] weekly ?for wrap changes instead of having home health change them. She denies signs of infection. ?8/4; patient presents for 1 week follow-up. She has tolerated the Kerlix/Coban wraps well. She no longer has weeping to her legs. She took the wrap off 1 day ?before coming in to be able to take a shower. She has no issues or complaints today. She denies signs of infection. ?8/18; patient presents for follow-up. Patient has tolerated the wraps well. She brought her Velcro compression wraps  today. She has no  issues or complaints ?today. She had her chemotherapy infusion yesterday without issues. She denies signs of infection. ?8/25; patient presents for follow-up. She used her juxta lite compressions for the past week. It is unclear if she is able to put these on correctly since she ?states she has a hard time getting them to look right. She reports 2 open wounds. She currently denies signs of infection. ?9/1; patient presents for 1 week follow-up. She has 1 open wound. She tolerated the compression wraps well. She currently denies signs of infection. ?9/8; patient presents for 1 week follow-up. She has 2 open wounds 1 on each leg. She has tolerated the compression wrap well. She currently denies signs of ?infection. ?9/15; patient presents for 1 week follow-up. She now has 3 wounds. 2 on the left and 1 on the right. She continues to tolerate the compression wrap well. She ?currently denies signs of infection. She has obtained furosemide by her primary care physician and would like to discuss when to take this. ?9/22; patient presents for 1 week follow-up. She has scattered wounds on her lower extremities bilaterally. She did take furosemide twice in the past week. She ?does not recall having to urinate more frequently. She tolerated the 3 layer compression well. She denies signs of infection. ?10/13; patient has not been here recently because of [place]. Apparently the facility was only putting gauze on her legs. This is a patient I do not normally see. ?She has a history of squamous cell carcinoma bilaterally on her anterior lower legs followed for a period of time by Dr. Ronnald Ramp at [facility] dermatology. She ?is quite convincing that she did not have radiation to her lower legs. It is likely she also has significant chronic venous insufficiency stasis dermatitis. We ?have been using silver alginate under kerlix Coban. She has obvious open areas medially on the right and areas on the left. She also has  areas of extensive ?dry flaking adherent areas on the right and to a lesser extent on the left anterior. Nodular areas on the left lateral lower leg left posterior calf and right mid calf ?medially. ?10/21; patient presents for follow-up. She has no issues or complaints today. She has tolerated the 3 layer compression well bilaterally. She denies signs of ?infection. ?10/27; patient presents for follow-up. She continues to tolerate the 3 layer compression wrap well. She denies signs of infection. ?11/30; patient presents for follow-up. She has no issues or complaints today. ?11/10; patient arrived in clinic today for nurse visit accompanied by her daughter from [place]. The daughter had multiple questions so we turned this into ?doctors visit. Apparently after the last time I saw this woman in October she went to see Dr. Ronnald Ramp but he did not take the wraps off. She previously was ?treated with Cemiplimab for her squamous cell carcinoma. She did not receive radiation to her legs. I had wanted Dr. Ronnald Ramp to look at this because of the ?exceptionally damaged skin on her lower legs bilaterally. She has odd looking wounds on the left medial lower leg also extending posteriorly which we have not ?made a lot of progress on. But she also has a raised hyperkeratotic nodules on the right anterior tibial area plaques on the left medial lower thigh. These are not ?areas that are under compression. We have been using silver alginate on any open areas Liberal TCA under 3 layer compression. ?11/17; patient presents for follow-up. She had her wraps taken off yesterday for her dermatology appointment. She reports excessive  weeping to her legs ?bilaterally after the wraps were taken off. She currently denies signs of infection. ?12/12/2020 upon evaluation today patient appears to be doing about the same in regard to her wounds. She was actually started on Cipro after having biopsies ?apparently at her dermatology clinic she does not  have the results back from the actual biopsy but the culture did return and apparently they placed her on the ?Cipro she started that just this morning. Other than that her legs appear to be doing okay at this

## 2021-04-25 NOTE — Progress Notes (Addendum)
[name] Aryonna A. ([id number]) ?, ?Visit Report for 04/25/2021 ?Arrival Information Details ?Patient Name: Date of Service: ?[name], [name] 04/25/2021 1:30 PM ?Medical Record Number: [id number] ?Patient Account Number: [id number] ?Date of Birth/Sex: Treating RN: ?1925-04-25 (88 y.o. F) Bell, [name] ?Primary Care Erlin Gardella: [name] Other Clinician: ?Referring Dequan Kindred: ?Treating Erykah Lippert/Extender: Kalman Shan ?[name], [name] ?Weeks in Treatment: 20 ?Visit Information History Since Last Visit ?Added or deleted any medications: No ?Patient Arrived: [name] ?Any new allergies or adverse reactions: No ?Arrival Time: 13:52 ?Had a fall or experienced change in No ?Accompanied By: self ?activities of daily living that may affect ?Transfer Assistance: None ?risk of falls: ?Patient Identification Verified: Yes ?Signs or symptoms of abuse/neglect since last visito No ?Secondary Verification Process Completed: Yes ?Hospitalized since last visit: No ?Patient Requires Transmission-Based No ?Implantable device outside of the clinic excluding No ?Precautions: ?cellular tissue based products placed in the center ?Patient Has Alerts: Yes ?since last visit: ?Patient Alerts: R ABI = Non compressible ?Has Dressing in Place as Prescribed: Yes ?L ABI = Non compressible ?Has Compression in Place as Prescribed: Yes ?Pain Present Now: No ?Notes ?per patient has received her lymphedema pumps-scheduled for rep to demonstrate how to use them for Monday. ?Electronic Signature(s) ?Signed: 04/25/2021 5:34:01 PM By: Deon Pilling RN, BSN ?Entered By: Deon Pilling on 04/25/2021 13:56:45 ?-------------------------------------------------------------------------------- ?Compression Therapy Details ?Patient Name: Date of Service: ?[name], [name] 04/25/2021 1:30 PM ?Medical Record Number: [id number] ?Patient Account Number: [id number] ?Date of Birth/Sex: Treating RN: ?October 13, 1925 (88 y.o. F) Bell, [name] ?Primary Care Delvon Chipps: [name] Other Clinician: ?Referring Makani Seckman: ?Treating Florina Glas/Extender: Kalman Shan ?[name], [name] ?Weeks in Treatment: 29 ?Compression Therapy Performed for Wound Assessment: Wound #19 Right,Circumferential Lower Leg ?Performed By: Clinician Deon Pilling, RN ?Compression Type: Three Layer ?Post Procedure Diagnosis ?Same as Pre-procedure ?Electronic Signature(s) ?Signed: 04/25/2021 5:34:01 PM By: Deon Pilling RN, BSN ?Entered By: Deon Pilling on 04/25/2021 14:17:48 ?-------------------------------------------------------------------------------- ?Encounter Discharge Information Details ?Patient Name: ?Date of Service: ?[name], [name] 04/25/2021 1:30 PM ?Medical Record Number: [id number] ?Patient Account Number: [id number] ?Date of Birth/Sex: ?Treating RN: ?January 29, 1925 (88 y.o. F) Bell, [name] ?Primary Care Zhane Donlan: [name] ?Other Clinician: ?Referring Reiss Mowrey: ?Treating Aaro Meyers/Extender: Kalman Shan ?[name], [name] ?Weeks in Treatment: 71 ?Encounter Discharge Information Items ?Discharge Condition: Stable ?Ambulatory Status: [name] ?Discharge Destination: Home ?Transportation: Private Auto ?Accompanied By: self ?Schedule Follow-up Appointment: Yes ?Clinical Summary of Care: ?Electronic Signature(s) ?Signed: 04/26/2021 4:11:11 PM By: Deon Pilling RN, BSN ?Entered By: Deon Pilling on 04/26/2021 10:48:27 ?-------------------------------------------------------------------------------- ?Lower Extremity Assessment Details ?Patient Name: ?Date of Service: ?[name], [name] 04/25/2021 1:30 PM ?Medical Record Number: [id number] ?Patient Account Number: [id number] ?Date of Birth/Sex: ?Treating RN: ?08/21/1925 (88 y.o. F) Bell, [name] ?Primary Care Muhanad Torosyan: [name] ?Other Clinician: ?Referring Binnie Vonderhaar: ?Treating Kahil Agner/Extender: Kalman Shan ?[name], [name] ?Weeks in Treatment: 49 ?Edema Assessment ?Assessed: [Left: Yes] [Right: Yes] ?Edema: [Left: No] [Right:  No] ?Calf ?Left: Right: ?Point of Measurement: 30 cm From Medial Instep 30 cm 31.5 cm ?Ankle ?Left: Right: ?Point of Measurement: 9 cm From Medial Instep 22 cm 20.5 cm ?Vascular Assessment ?Pulses: ?Dorsalis Pedis ?Palpable: [Left:Yes] [Right:Yes] ?Electronic Signature(s) ?Signed: 04/25/2021 5:34:01 PM By: Deon Pilling RN, BSN ?Entered By: Deon Pilling on 04/25/2021 14:04:52 ?-------------------------------------------------------------------------------- ?Multi Wound Chart Details ?Patient Name: ?Date of Service: ?[name], [name] 04/25/2021 1:30 PM ?Medical Record Number: [id number] ?Patient Account Number: [id number] ?Date of Birth/Sex: ?Treating RN: ?[birth year] (88 y.o. F) Bell, [name] ?Primary Care Elray Dains: [name],  [name] ?Other Clinician: ?Referring Milbern Doescher: ?Treating Jenaro Souder/Extender: Kalman Shan ?[name], [name] ?Weeks in Treatment: 21 ?Vital Signs ?Height(in): 65 ?Pulse(bpm): 73 ?Weight(lbs): 163 ?Blood Pressure(mmHg): 132/78 ?Body Mass Index(BMI): 27.1 ?Temperature(??F): 97.8 ?Respiratory Rate(breaths/min): 20 ?Photos: [N/A:N/A] ?Right, Circumferential Lower Leg Left, Lateral Lower Leg N/A ?Wound Location: ?Blister Blister N/A ?Wounding Event: ?Venous Leg Ulcer Malignant Wound N/A ?Primary Etiology: ?Anemia, Arrhythmia, Congestive Heart Anemia, Arrhythmia, Congestive Heart N/A ?Comorbid History: ?Failure, Hypertension, Colitis, Failure, Hypertension, Colitis, ?Osteoarthritis Osteoarthritis ?02/06/2021 04/20/2020 N/A ?Date Acquired: ?61 45 N/A ?Weeks of Treatment: ?Open Healed - Epithelialized N/A ?Wound Status: ?No Yes N/A ?Wound Recurrence: ?Yes Yes N/A ?Clustered Wound: ?2 1 N/A ?Clustered Quantity: ?5x3x0.1 0x0x0 N/A ?Measurements L x W x D (cm) ?11.781 0 N/A ?A (cm?) : ?rea ?1.178 0 N/A ?Volume (cm?) : ?97.70% 100.00% N/A ?% Reduction in Area: ?99.50% 100.00% N/A ?% Reduction in Volume: ?Full Thickness Without Exposed Full Thickness Without Exposed N/A ?Classification: ?Support Structures  Support Structures ?Medium None Present N/A ?Exudate Amount: ?Serosanguineous N/A N/A ?Exudate Type: ?red, brown N/A N/A ?Exudate Color: ?Distinct, outline attached Distinct, outline attached N/A ?Wound Margin: ?Large (67-100%) None Present (0%) N/A ?Granulation Amount: ?Red, Pink N/A N/A ?Granulation Quality: ?None Present (0%) None Present (0%) N/A ?Necrotic Amount: ?Fat Layer (Subcutaneous Tissue): Yes Fascia: No N/A ?Exposed Structures: ?Fascia: No ?Fat Layer (Subcutaneous Tissue): No ?Tendon: No ?Tendon: No ?Muscle: No ?Muscle: No ?Joint: No ?Joint: No ?Bone: No ?Bone: No ?Large (67-100%) Large (67-100%) N/A ?Epithelialization: ?Compression Therapy N/A N/A ?Procedures Performed: ?Treatment Notes ?Electronic Signature(s) ?Signed: 04/25/2021 4:54:00 PM By: Kalman Shan DO ?Signed: 04/25/2021 5:34:01 PM By: Deon Pilling RN, BSN ?Entered By: Kalman Shan on 04/25/2021 16:33:19 ?-------------------------------------------------------------------------------- ?Multi-Disciplinary Care Plan Details ?Patient Name: ?Date of Service: ?[name], [name] 04/25/2021 1:30 PM ?Medical Record Number: [id number] ?Patient Account Number: [id number] ?Date of Birth/Sex: ?Treating RN: ?November 02, 1925 (88 y.o. F) Bell, [name] ?Primary Care Mykenzi Vanzile: [name] ?Other Clinician: ?Referring Tandi Hanko: ?Treating Anneth Brunell/Extender: Kalman Shan ?[name], [name] ?Weeks in Treatment: 79 ?Multidisciplinary Care Plan reviewed with physician ?Active Inactive ?Venous Leg Ulcer ?Nursing Diagnoses: ?Knowledge deficit related to disease process and management ?Potential for venous Insuffiency (use before diagnosis confirmed) ?Goals: ?Patient will maintain optimal edema control ?Date Initiated: 11/01/2020 ?Target Resolution Date: 05/17/2021 ?Goal Status: Active ?Interventions: ?Assess peripheral edema status every visit. ?Compression as ordered ?Provide education on venous insufficiency ?Treatment Activities: ?Therapeutic compression  applied : 11/01/2020 ?Notes: ?12/27/20: Edema control ongoing 01/24/21: Edema control continues, using 3 layer compression ?Wound/Skin Impairment ?Nursing Diagnoses: ?Impaired tissue integrity ?Knowledge

## 2021-04-29 ENCOUNTER — Encounter: Payer: Self-pay | Admitting: Nurse Practitioner

## 2021-04-29 ENCOUNTER — Encounter (HOSPITAL_BASED_OUTPATIENT_CLINIC_OR_DEPARTMENT_OTHER): Payer: Medicare Other | Admitting: Internal Medicine

## 2021-04-29 DIAGNOSIS — E039 Hypothyroidism, unspecified: Secondary | ICD-10-CM | POA: Diagnosis not present

## 2021-04-29 DIAGNOSIS — I87313 Chronic venous hypertension (idiopathic) with ulcer of bilateral lower extremity: Secondary | ICD-10-CM | POA: Diagnosis not present

## 2021-04-29 DIAGNOSIS — L97822 Non-pressure chronic ulcer of other part of left lower leg with fat layer exposed: Secondary | ICD-10-CM | POA: Diagnosis not present

## 2021-04-29 DIAGNOSIS — L97812 Non-pressure chronic ulcer of other part of right lower leg with fat layer exposed: Secondary | ICD-10-CM | POA: Diagnosis not present

## 2021-04-29 DIAGNOSIS — C4492 Squamous cell carcinoma of skin, unspecified: Secondary | ICD-10-CM | POA: Diagnosis not present

## 2021-04-29 DIAGNOSIS — I890 Lymphedema, not elsewhere classified: Secondary | ICD-10-CM | POA: Diagnosis not present

## 2021-04-29 NOTE — Progress Notes (Signed)
This encounter was created in error - please disregard.

## 2021-04-29 NOTE — Progress Notes (Signed)
[name] Ambriana A. ([id number]) ?, ?Visit Report for 04/29/2021 ?SuperBill Details ?Patient Name: Date of Service: ?[name], Tonette A. 04/29/2021 ?Medical Record Number: [id number] ?Patient Account Number: [id number] ?Date of Birth/Sex: Treating RN: ?July 18, 1925 (88 y.o. F) Deaton, Bobbi ?Primary Care Provider: [name] Other Clinician: ?Referring Provider: ?Treating Provider/Extender: Linton Ham ?[name], [name] ?Weeks in Treatment: 46 ?Diagnosis Coding ?ICD-10 Codes ?Code Description ?C44.92 Squamous cell carcinoma of skin, unspecified ?U38.453 Non-pressure chronic ulcer of other part of right lower leg with fat layer exposed ?M46.803 Non-pressure chronic ulcer of other part of left lower leg with fat layer exposed ?I89.0 Lymphedema, not elsewhere classified ?I87.313 Chronic venous hypertension (idiopathic) with ulcer of bilateral lower extremity ?Facility Procedures ?CPT4 Code Description Modifier Quantity ?[id number] (Facility Use Only) [phone] - APPLY MULTLAY COMPRS LWR LT LEG 1 ?Electronic Signature(s) ?Signed: 04/29/2021 4:41:48 PM By: Linton Ham MD ?Signed: 04/29/2021 5:32:14 PM By: Deon Pilling RN, BSN ?Entered By: Deon Pilling on 04/29/2021 15:33:14 ?

## 2021-04-29 NOTE — Progress Notes (Signed)
Teo Giani A. ([id number]) ?, ?Visit Report for 04/29/2021 ?Arrival Information Details ?Patient Name: Date of Service: ?[name], [name] 04/29/2021 2:45 PM ?Medical Record Number: [id number] ?Patient Account Number: [id number] ?Date of Birth/Sex: Treating RN: ?March 28, 1925 (88 y.o. F) Deaton, Bobbi ?Primary Care Taccara Bushnell: [name] Other Clinician: ?Referring Ijeoma Loor: ?Treating Lupe Handley/Extender: Linton Ham ?[name], [name] ?Weeks in Treatment: 46 ?Visit Information History Since Last Visit ?Added or deleted any medications: No ?Patient Arrived: [name] ?Any new allergies or adverse reactions: No ?Arrival Time: 14:45 ?Had a fall or experienced change in No ?Accompanied By: self ?activities of daily living that may affect ?Transfer Assistance: None ?risk of falls: ?Patient Identification Verified: Yes ?Signs or symptoms of abuse/neglect since last visito No ?Secondary Verification Process Completed: Yes ?Hospitalized since last visit: No ?Patient Requires Transmission-Based No ?Implantable device outside of the clinic excluding No ?Precautions: ?cellular tissue based products placed in the center ?Patient Has Alerts: Yes ?since last visit: ?Patient Alerts: R ABI = Non compressible ?Has Dressing in Place as Prescribed: Yes ?L ABI = Non ?Has Compression in Place as Prescribed: Yes ?compressible ?Pain Present Now: No ?Electronic Signature(s) ?Signed: 04/29/2021 5:32:14 PM By: Deon Pilling RN, BSN ?Entered By: Deon Pilling on 04/29/2021 14:52:43 ?-------------------------------------------------------------------------------- ?Compression Therapy Details ?Patient Name: Date of Service: ?[name], [name] 04/29/2021 2:45 PM ?Medical Record Number: [id number] ?Patient Account Number: [id number] ?Date of Birth/Sex: Treating RN: ?[birth year] (88 y.o. F) Deaton, Bobbi ?Primary Care Ladonne Sharples: [name] Other Clinician: ?Referring Carlyann Placide: ?Treating Isacc Turney/Extender: Linton Ham ?[name], [name] ?Weeks in Treatment: 46 ?Compression Therapy Performed for Wound Assessment: Wound #7RR Left,Circumferential Lower Leg ?Performed By: Clinician Deon Pilling, RN ?Compression Type: Three Layer ?Electronic Signature(s) ?Signed: 04/29/2021 5:32:14 PM By: Deon Pilling RN, BSN ?Entered By: Deon Pilling on 04/29/2021 15:30:56 ?-------------------------------------------------------------------------------- ?Encounter Discharge Information Details ?Patient Name: Date of Service: ?[name], [name] 04/29/2021 2:45 PM ?Medical Record Number: [id number] ?Patient Account Number: [id number] ?Date of Birth/Sex: ?Treating RN: ?1925-11-27 (88 y.o. F) Deaton, Bobbi ?Primary Care Rmani Kellogg: [name] ?Other Clinician: ?Referring Milianna Ericsson: ?Treating Claudius Mich/Extender: Linton Ham ?[name], [name] ?Weeks in Treatment: 46 ?Encounter Discharge Information Items ?Discharge Condition: Stable ?Ambulatory Status: [name] ?Discharge Destination: Home ?Transportation: Private Auto ?Accompanied By: self ?Schedule Follow-up Appointment: Yes ?Clinical Summary of Care: ?Electronic Signature(s) ?Signed: 04/29/2021 5:32:14 PM By: Deon Pilling RN, BSN ?Entered By: Deon Pilling on 04/29/2021 15:33:02 ?-------------------------------------------------------------------------------- ?Patient/Caregiver Education Details ?Patient Name: ?Date of Service: ?[name], [name] 4/10/2023andnbsp2:45 PM ?Medical Record Number: [id number] ?Patient Account Number: [id number] ?Date of Birth/Gender: ?Treating RN: ?[birth year] (88 y.o. F) Deaton, Bobbi ?Primary Care Physician: [name] ?Other Clinician: ?Referring Physician: ?Treating Physician/Extender: Linton Ham ?[name], [name] ?Weeks in Treatment: 46 ?Education Assessment ?Education Provided To: ?Patient ?Education Topics Provided ?Venous: ?Handouts: Controlling Swelling with Compression Stockings , Controlling Swelling with Multilayered Compression Wraps, Managing Venous  Disease and ?Related Ulcers ?Methods: Explain/Verbal ?Responses: Reinforcements needed ?Electronic Signature(s) ?Signed: 04/29/2021 5:32:14 PM By: Deon Pilling RN, BSN ?Entered By: Deon Pilling on 04/29/2021 15:32:48 ?-------------------------------------------------------------------------------- ?Wound Assessment Details ?Patient Name: ?Date of Service: ?[name], [name] 04/29/2021 2:45 PM ?Medical Record Number: [id number] ?Patient Account Number: [id number] ?Date of Birth/Sex: ?Treating RN: ?March 26, 1925 (88 y.o. F) Deaton, Bobbi ?Primary Care Katoya Amato: [name] ?Other Clinician: ?Referring Aleea Hendry: ?Treating Lark Runk/Extender: Linton Ham ?[name], [name] ?Weeks in Treatment: 46 ?Wound Status ?Wound Number: [id number] Primary Malignant Wound ?Etiology: ?Etiology: ?Wound Location: Left, Circumferential Lower Leg ?Wound Open ?Wounding Event: Blister ?Status: ?Date Acquired: 04/20/2020 ?Comorbid  Anemia, Arrhythmia, Congestive Heart Failure, Hypertension, ?Weeks Of Treatment: 46 ?History: Colitis, Osteoarthritis ?Clustered Wound: Yes ?Wound Measurements ?Length: (cm) 11 ?Width: (cm) 20 ?Depth: (cm) 0.1 ?Clustered Quantity: 10 ?Area: (cm?) 172.788 ?Volume: (cm?) 17.279 ?% Reduction in Area: -5526.4% ?% Reduction in Volume: -5528.3% ?Epithelialization: None ?Tunneling: No ?Undermining: No ?Wound Description ?Classification: Full Thickness Without Exposed Support Structures ?Wound Margin: Distinct, outline attached ?Exudate Amount: Large ?Exudate Type: Serosanguineous ?Exudate Color: red, brown ?Foul Odor After Cleansing: No ?Slough/Fibrino Yes ?Wound Bed ?Granulation Amount: Large (67-100%) Exposed Structure ?Granulation Quality: Red, Pink ?Fascia Exposed: No ?Necrotic Amount: Small (1-33%) ?Fat Layer (Subcutaneous Tissue) Exposed: Yes ?Necrotic Quality: Adherent Slough ?Tendon Exposed: No ?Muscle Exposed: No ?Joint Exposed: No ?Bone Exposed: No ?Assessment Notes ?x10 blisters noted to left leg. ?Treatment  Notes ?Wound #7RR (Lower Leg) Wound Laterality: Left, Circumferential ?Cleanser ?Soap and Water ?Discharge Instruction: May shower and wash wound with dial antibacterial soap and water prior to dressing change. ?Wound Cleanser ?Discharge Instruction: Cleanse the wound with wound cleanser prior to applying a clean dressing using gauze sponges, not tissue or cotton ?balls. ?Peri-Wound Care ?Triamcinolone 15 (g) ?Discharge Instruction: Use triamcinolone 15 (g) as directed ?Sween Lotion (Moisturizing lotion) ?Discharge Instruction: Apply moisturizing lotion as directed ?Topical ?Primary Dressing ?KerraCel Ag Gelling Fiber Dressing, 4x5 in (silver alginate) ?Discharge Instruction: Apply silver alginate to wound bed as instructed ?Secondary Dressing ?ABD Pad, 8x10 ?Discharge Instruction: Apply over primary dressing as directed. ?Zetuvit Plus 4x8 in ?Discharge Instruction: Apply over primary dressing as directed. ?Secured With ?Compression Wrap ?ThreePress (3 layer compression wrap) ?Discharge Instruction: Apply three layer compression as directed. ?Compression Stockings ?Add-Ons ?Notes ?Carlee Tesfaye given a verbal order for TCA, lotion, alignate Ag, abd pads, zetuvits, and 3 layer compression for left leg. ?Electronic Signature(s) ?Signed: 04/29/2021 5:32:14 PM By: Deon Pilling RN, BSN ?Entered By: Deon Pilling on 04/29/2021 15:01:12 ?-------------------------------------------------------------------------------- ?Vitals Details ?Patient Name: ?Date of Service: ?[name], [name] 04/29/2021 2:45 PM ?Medical Record Number: [id number] ?Patient Account Number: [id number] ?Date of Birth/Sex: ?Treating RN: ?[birth year] (88 y.o. F) Deaton, Bobbi ?Primary Care Edgardo Petrenko: [name] ?Other Clinician: ?Referring Xaviar Lunn: ?Treating Renna Kilmer/Extender: Linton Ham ?[name], [name] ?Weeks in Treatment: 46 ?Vital Signs ?Time Taken: 14:45 ?Temperature (??F): 97.7 ?Height (in): 65 ?Pulse (bpm): 73 ?Weight (lbs):  163 ?Respiratory Rate (breaths/min): 20 ?Body Mass Index (BMI): 27.1 ?Blood Pressure (mmHg): 149/74 ?Reference Range: 80 - 120 mg / dl ?Electronic Signature(s) ?Signed: 04/29/2021 5:32:14 PM By: Deon Pilling RN, BSN ?

## 2021-05-02 ENCOUNTER — Encounter (HOSPITAL_BASED_OUTPATIENT_CLINIC_OR_DEPARTMENT_OTHER): Payer: Medicare Other | Admitting: Internal Medicine

## 2021-05-02 ENCOUNTER — Other Ambulatory Visit: Payer: Self-pay | Admitting: Internal Medicine

## 2021-05-02 DIAGNOSIS — L97812 Non-pressure chronic ulcer of other part of right lower leg with fat layer exposed: Secondary | ICD-10-CM | POA: Diagnosis not present

## 2021-05-02 DIAGNOSIS — I87313 Chronic venous hypertension (idiopathic) with ulcer of bilateral lower extremity: Secondary | ICD-10-CM | POA: Diagnosis not present

## 2021-05-02 DIAGNOSIS — L97822 Non-pressure chronic ulcer of other part of left lower leg with fat layer exposed: Secondary | ICD-10-CM | POA: Diagnosis not present

## 2021-05-02 DIAGNOSIS — I890 Lymphedema, not elsewhere classified: Secondary | ICD-10-CM | POA: Diagnosis not present

## 2021-05-03 NOTE — Progress Notes (Signed)
Wint Vanita A. ([id number]) ?, ?Visit Report for 05/02/2021 ?Arrival Information Details ?Patient Name: Date of Service: ?[name], [name] 05/02/2021 1:30 PM ?Medical Record Number: [id number] ?Patient Account Number: [id number] ?Date of Birth/Sex: Treating RN: ?January 20, 1926 (88 y.o. F) Deaton, Bobbi ?Primary Care Annamaria Salah: [name] Other Clinician: ?Referring Shameca Landen: ?Treating Amyiah Gaba/Extender: Linton Ham ?[name], [name] ?Weeks in Treatment: 46 ?Visit Information History Since Last Visit ?Added or deleted any medications: No ?Patient Arrived: [name] ?Any new allergies or adverse reactions: No ?Arrival Time: 14:00 ?Had a fall or experienced change in No ?Accompanied By: self ?activities of daily living that may affect ?Transfer Assistance: None ?risk of falls: ?Patient Identification Verified: Yes ?Signs or symptoms of abuse/neglect since last visito No ?Secondary Verification Process Completed: Yes ?Hospitalized since last visit: No ?Patient Requires Transmission-Based No ?Implantable device outside of the clinic excluding No ?Precautions: ?cellular tissue based products placed in the center ?Patient Has Alerts: Yes ?since last visit: ?Patient Alerts: R ABI = Non compressible ?Has Dressing in Place as Prescribed: Yes ?L ABI = Non compressible ?Has Compression in Place as Prescribed: Yes ?Pain Present Now: No ?Notes ?per patient started Tuesday 30 minutes at a time twice a day using lymphedema pumps. ?Electronic Signature(s) ?Signed: 05/03/2021 1:08:25 PM By: Deon Pilling RN, BSN ?Entered By: Deon Pilling on 05/02/2021 14:07:42 ?-------------------------------------------------------------------------------- ?Compression Therapy Details ?Patient Name: Date of Service: ?[name], [name] 05/02/2021 1:30 PM ?Medical Record Number: [id number] ?Patient Account Number: [id number] ?Date of Birth/Sex: Treating RN: ?1925-07-14 (88 y.o. F) Deaton, Bobbi ?Primary Care Eimy Plaza: [name] Other  Clinician: ?Referring Loriann Bosserman: ?Treating Ruari Mudgett/Extender: Linton Ham ?[name], [name] ?Weeks in Treatment: 46 ?Compression Therapy Performed for Wound Assessment: Wound #19 Right,Circumferential Lower Leg ?Performed By: Clinician Deon Pilling, RN ?Compression Type: Three Layer ?Post Procedure Diagnosis ?Same as Pre-procedure ?Electronic Signature(s) ?Signed: 05/03/2021 1:08:25 PM By: Deon Pilling RN, BSN ?Entered By: Deon Pilling on 05/02/2021 14:53:03 ?-------------------------------------------------------------------------------- ?Compression Therapy Details ?Patient Name: ?Date of Service: ?[name], [name] 05/02/2021 1:30 PM ?Medical Record Number: [id number] ?Patient Account Number: [id number] ?Date of Birth/Sex: ?Treating RN: ?Jan 27, 1925 (88 y.o. F) Deaton, Bobbi ?Primary Care Daliya Parchment: [name] ?Other Clinician: ?Referring Jailene Cupit: ?Treating Saquoia Sianez/Extender: Linton Ham ?[name], [name] ?Weeks in Treatment: 46 ?Compression Therapy Performed for Wound Assessment: Wound #7RR Left,Circumferential Lower Leg ?Performed By: Clinician Deon Pilling, RN ?Compression Type: Three Layer ?Post Procedure Diagnosis ?Same as Pre-procedure ?Electronic Signature(s) ?Signed: 05/03/2021 1:08:25 PM By: Deon Pilling RN, BSN ?Entered By: Deon Pilling on 05/02/2021 14:53:03 ?-------------------------------------------------------------------------------- ?Encounter Discharge Information Details ?Patient Name: ?Date of Service: ?[name], [name] 05/02/2021 1:30 PM ?Medical Record Number: [id number] ?Patient Account Number: [id number] ?Date of Birth/Sex: ?Treating RN: ?03/24/1925 (88 y.o. F) Deaton, Bobbi ?Primary Care Starasia Sinko: [name] ?Other Clinician: ?Referring Avigail Pilling: ?Treating Larson Limones/Extender: Linton Ham ?[name], [name] ?Weeks in Treatment: 46 ?Encounter Discharge Information Items ?Discharge Condition: Stable ?Ambulatory Status: [name] ?Discharge Destination:  Home ?Transportation: Private Auto ?Accompanied By: self ?Schedule Follow-up Appointment: Yes ?Clinical Summary of Care: ?Electronic Signature(s) ?Signed: 05/03/2021 1:08:25 PM By: Deon Pilling RN, BSN ?Entered By: Deon Pilling on 05/02/2021 15:44:38 ?-------------------------------------------------------------------------------- ?Lower Extremity Assessment Details ?Patient Name: ?Date of Service: ?[name], [name] 05/02/2021 1:30 PM ?Medical Record Number: [id number] ?Patient Account Number: [id number] ?Date of Birth/Sex: ?Treating RN: ?[birth year] (88 y.o. F) Deaton, Bobbi ?Primary Care Majid Mccravy: [name] ?Other Clinician: ?Referring Beryl Balz: ?Treating Elster Corbello/Extender: Linton Ham ?[name], [name] ?Weeks in Treatment: 46 ?Edema Assessment ?Assessed: [Left: Yes] [Right: Yes] ?Edema: [Left:  No] [Right: No] ?Calf ?Left: Right: ?Point of Measurement: 30 cm From Medial Instep 30 cm 28.5 cm ?Ankle ?Left: Right: ?Point of Measurement: 9 cm From Medial Instep 22 cm 20.5 cm ?Knee To Floor ?Left: Right: ?From Medial Instep 36 cm 36 cm ?Vascular Assessment ?Pulses: ?Dorsalis Pedis ?Palpable: [Left:Yes] [Right:Yes] ?Electronic Signature(s) ?Signed: 05/03/2021 1:08:25 PM By: Deon Pilling RN, BSN ?Entered By: Deon Pilling on 05/02/2021 14:13:56 ?-------------------------------------------------------------------------------- ?Multi Wound Chart Details ?Patient Name: ?Date of Service: ?[name], [name] 05/02/2021 1:30 PM ?Medical Record Number: [id number] ?Patient Account Number: [id number] ?Date of Birth/Sex: ?Treating RN: ?Mar 14, 1925 (88 y.o. F) Deaton, Bobbi ?Primary Care Jessicalynn Deshong: [name] ?Other Clinician: ?Referring Nadalyn Deringer: ?Treating Soma Lizak/Extender: Linton Ham ?[name], [name] ?Weeks in Treatment: 46 ?Vital Signs ?Height(in): 65 ?Pulse(bpm): 65 ?Weight(lbs): 163 ?Blood Pressure(mmHg): 125/69 ?Body Mass Index(BMI): 27.1 ?Temperature(??F): 97.9 ?Respiratory Rate(breaths/min):  20 ?Photos: [N/A:N/A] ?Right, Circumferential Lower Leg Left, Circumferential Lower Leg N/A ?Wound Location: ?Blister Blister N/A ?Wounding Event: ?Venous Leg Ulcer Malignant Wound N/A ?Primary Etiology: ?Anemia, Arrhythmia, Congestive Heart Anemia, Arrhythmia, Congestive Heart N/A ?Comorbid History: ?Failure, Hypertension, Colitis, Failure, Hypertension, Colitis, ?Osteoarthritis Osteoarthritis ?02/06/2021 04/20/2020 N/A ?Date Acquired: ?42 46 N/A ?Weeks of Treatment: ?Open Open N/A ?Wound Status: ?No Yes N/A ?Wound Recurrence: ?Yes Yes N/A ?Clustered Wound: ?1 10 N/A ?Clustered Quantity: ?0.1x0.1x0.1 15.5x26x0.1 N/A ?Measurements L x W x D (cm) ?0.008 316.515 N/A ?A (cm?) : ?rea ?0.001 31.652 N/A ?Volume (cm?) : ?100.00% -10206.60% N/A ?% Reduction in Area: ?100.00% -10210.10% N/A ?% Reduction in Volume: ?Full Thickness Without Exposed Full Thickness Without Exposed N/A ?Classification: ?Support Structures Support Structures ?Medium Large N/A ?Exudate Amount: ?Serosanguineous Serosanguineous N/A ?Exudate Type: ?red, brown red, brown N/A ?Exudate Color: ?Distinct, outline attached Distinct, outline attached N/A ?Wound Margin: ?Large (67-100%) Large (67-100%) N/A ?Granulation Amount: ?Red, Pink Red, Pink N/A ?Granulation Quality: ?None Present (0%) Small (1-33%) N/A ?Necrotic Amount: ?Fat Layer (Subcutaneous Tissue): Yes Fat Layer (Subcutaneous Tissue): Yes N/A ?Exposed Structures: ?Fascia: No ?Fascia: No ?Tendon: No ?Tendon: No ?Muscle: No ?Muscle: No ?Joint: No ?Joint: No ?Bone: No ?Bone: No ?Large (67-100%) None N/A ?Epithelialization: ?Compression Therapy Compression Therapy N/A ?Procedures Performed: ?Treatment Notes ?Electronic Signature(s) ?Signed: 05/02/2021 4:28:39 PM By: Linton Ham MD ?Signed: 05/03/2021 1:08:25 PM By: Deon Pilling RN, BSN ?Entered By: Linton Ham on 05/02/2021 14:55:34 ?-------------------------------------------------------------------------------- ?Multi-Disciplinary Care Plan  Details ?Patient Name: ?Date of Service: ?[name], [name] 05/02/2021 1:30 PM ?Medical Record Number: [id number] ?Patient Account Number: [id number] ?Date of Birth/Sex: ?Treating RN: ?April 12, 1925 (88 y.o. F) Deaton, [place]

## 2021-05-03 NOTE — Progress Notes (Signed)
Polson Randi A. ([id number]) ?, ?Visit Report for 05/02/2021 ?HPI Details ?Patient Name: Date of Service: ?[name], [name] 05/02/2021 1:30 PM ?Medical Record Number: [id number] ?Patient Account Number: [id number] ?Date of Birth/Sex: Treating RN: ?08-03-1925 (88 y.o. F) Deaton, Bobbi ?Primary Care Provider: [name] Other Clinician: ?Referring Provider: ?Treating Provider/Extender: Linton Ham ?[name], [name] ?Weeks in Treatment: 46 ?History of Present Illness ?Location: left leg ?HPI Description: Admission 5/23 ?Ms. 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 She states that the wound on ?the right has been present for a year. The 1 on the left has opened up 1 month ago. She is followed with oncology for this issue as she had biopsies that ?showed squamous cell carcinoma. She is also seeing radiation oncology for treatment options. She presents today because she would like for her wounds to be ?healed by [place]. She currently denies signs of infection. ?6/1; patient presents for 1 week follow-up. She states she has tolerated the leg wraps well. She states these do not bother her and is happy to continue with ?them. She is scheduled to see her oncologist today to go over treatment options for the bilateral lower extremity squamous cell carcinoma. Radiation is ?currently not a recommended option. Patient states she overall feels well. ?6/22; patient presents for 3-week follow-up. She has tolerated the wraps well until her last wrap where she states they were uncomfortable. She attributes this to ?the home health nurse. She denies signs of infection. She has started her first treatment of antibody infusions for her Bilateral lower extremity squamous cell ?carcinoma. She has no  complaints today. ?7/21; patient presents for 1 month follow-up. Unfortunately she has not had good experience with her wrap changes with home health. She would like to do her ?own dressing changes. She continues to do her antibody infusions. She denies signs of infection. ?7/28; patient presents for 1 week follow-up. At last clinic visit she was switched to daily dressing changes due to issues with the wrap and home health placing ?them. Unfortunately she has developed weeping to her legs bilaterally. She would like to be placed in wraps today. She would also like to follow with [place] weekly ?for wrap changes instead of having home health change them. She denies signs of infection. ?8/4; patient presents for 1 week follow-up. She has tolerated the Kerlix/Coban wraps well. She no longer has weeping to her legs. She took the wrap off 1 day ?before coming in to be able to take a shower. She has no issues or complaints today. She denies signs of infection. ?8/18; patient presents for follow-up. Patient has tolerated the wraps well. She brought her Velcro compression wraps today. She has no issues or complaints ?today. She had her chemotherapy infusion yesterday without issues. She denies signs of infection. ?8/25; patient presents for follow-up. She used her juxta lite compressions for the past week. It is unclear if she is able to put these on correctly since she ?states she has a hard time getting them to look right. She reports 2 open wounds. She currently denies signs of infection. ?9/1; patient presents for 1 week follow-up. She has 1 open wound. She tolerated the compression wraps well. She currently denies signs of infection. ?9/8; patient presents for 1 week follow-up. She has 2 open wounds  1 on each leg. She has tolerated the compression wrap well. She currently denies signs of ?infection. ?9/15; patient presents for 1 week follow-up. She now has 3 wounds. 2 on the left and 1 on the right. She continues to  tolerate the compression wrap well. She ?currently denies signs of infection. She has obtained furosemide by her primary care physician and would like to discuss when to take this. ?9/22; patient presents for 1 week follow-up. She has scattered wounds on her lower extremities bilaterally. She did take furosemide twice in the past week. She ?does not recall having to urinate more frequently. She tolerated the 3 layer compression well. She denies signs of infection. ?10/13; patient has not been here recently because of [place]. Apparently the facility was only putting gauze on her legs. This is a patient I do not normally see. ?She has a history of squamous cell carcinoma bilaterally on her anterior lower legs followed for a period of time by Dr. Ronnald Ramp at [facility] dermatology. She ?is quite convincing that she did not have radiation to her lower legs. It is likely she also has significant chronic venous insufficiency stasis dermatitis. We ?have been using silver alginate under kerlix Coban. She has obvious open areas medially on the right and areas on the left. She also has areas of extensive ?dry flaking adherent areas on the right and to a lesser extent on the left anterior. Nodular areas on the left lateral lower leg left posterior calf and right mid calf ?medially. ?10/21; patient presents for follow-up. She has no issues or complaints today. She has tolerated the 3 layer compression well bilaterally. She denies signs of ?infection. ?10/27; patient presents for follow-up. She continues to tolerate the 3 layer compression wrap well. She denies signs of infection. ?11/30; patient presents for follow-up. She has no issues or complaints today. ?11/10; patient arrived in clinic today for nurse visit accompanied by her daughter from [place]. The daughter had multiple questions so we turned this into ?doctors visit. Apparently after the last time I saw this woman in October she went to see Dr. Ronnald Ramp but he did not take the  wraps off. She previously was ?treated with Cemiplimab for her squamous cell carcinoma. She did not receive radiation to her legs. I had wanted Dr. Ronnald Ramp to look at this because of the ?exceptionally damaged skin on her lower legs bilaterally. She has odd looking wounds on the left medial lower leg also extending posteriorly which we have not ?made a lot of progress on. But she also has a raised hyperkeratotic nodules on the right anterior tibial area plaques on the left medial lower thigh. These are not ?areas that are under compression. We have been using silver alginate on any open areas Liberal TCA under 3 layer compression. ?11/17; patient presents for follow-up. She had her wraps taken off yesterday for her dermatology appointment. She reports excessive weeping to her legs ?bilaterally after the wraps were taken off. She currently denies signs of infection. ?12/12/2020 upon evaluation today patient appears to be doing about the same in regard to her wounds. She was actually started on Cipro after having biopsies ?apparently at her dermatology clinic she does not have the results back from the actual biopsy but the culture did return and apparently they placed her on the ?Cipro she started that just this morning. Other than that her legs appear to be doing okay at this time. ?12/1; patient presents for follow-up. She has no issues or complaints today.  She has started Lasix 40 mg daily to help with her bilateral leg swelling. ?12/8; patient presents for follow-up. She has no issues or complaints today. ?12/15; patient presents for 1 week follow-up. She has no issues or complaints today. ?12/22; patient presents for follow-up. She has no issues or complaints today. ?1/5; patient presents for follow-up. She has no issues or complaints today. She has tolerated the compression wraps well. ?1/12; patient presents for follow-up. She is tolerated the compression wrap well and has no issues or complaints today. She  denies signs of infection. ?1/19; patient presents for follow-up. She took the compression wraps off 1 to 2 days ago to take a shower and did not have compression wraps replaced. She ?reports increased blisteri

## 2021-05-08 ENCOUNTER — Inpatient Hospital Stay (HOSPITAL_BASED_OUTPATIENT_CLINIC_OR_DEPARTMENT_OTHER): Payer: Medicare Other | Admitting: Oncology

## 2021-05-08 ENCOUNTER — Inpatient Hospital Stay: Payer: Medicare Other

## 2021-05-08 ENCOUNTER — Other Ambulatory Visit: Payer: Self-pay

## 2021-05-08 ENCOUNTER — Inpatient Hospital Stay: Payer: Medicare Other | Attending: Oncology

## 2021-05-08 VITALS — BP 127/80 | HR 64 | Temp 98.1°F | Resp 17 | Wt 149.5 lb

## 2021-05-08 DIAGNOSIS — Z5112 Encounter for antineoplastic immunotherapy: Secondary | ICD-10-CM | POA: Insufficient documentation

## 2021-05-08 DIAGNOSIS — C44721 Squamous cell carcinoma of skin of unspecified lower limb, including hip: Secondary | ICD-10-CM

## 2021-05-08 DIAGNOSIS — Z79899 Other long term (current) drug therapy: Secondary | ICD-10-CM | POA: Insufficient documentation

## 2021-05-08 DIAGNOSIS — C4490 Unspecified malignant neoplasm of skin, unspecified: Secondary | ICD-10-CM | POA: Diagnosis not present

## 2021-05-08 DIAGNOSIS — C7650 Malignant neoplasm of unspecified lower limb: Secondary | ICD-10-CM | POA: Insufficient documentation

## 2021-05-08 DIAGNOSIS — E039 Hypothyroidism, unspecified: Secondary | ICD-10-CM

## 2021-05-08 LAB — CBC WITH DIFFERENTIAL (CANCER CENTER ONLY)
Abs Immature Granulocytes: 0.01 10*3/uL (ref 0.00–0.07)
Basophils Absolute: 0 10*3/uL (ref 0.0–0.1)
Basophils Relative: 1 %
Eosinophils Absolute: 0.2 10*3/uL (ref 0.0–0.5)
Eosinophils Relative: 3 %
HCT: 34.6 % — ABNORMAL LOW (ref 36.0–46.0)
Hemoglobin: 11.2 g/dL — ABNORMAL LOW (ref 12.0–15.0)
Immature Granulocytes: 0 %
Lymphocytes Relative: 17 %
Lymphs Abs: 1 10*3/uL (ref 0.7–4.0)
MCH: 34.5 pg — ABNORMAL HIGH (ref 26.0–34.0)
MCHC: 32.4 g/dL (ref 30.0–36.0)
MCV: 106.5 fL — ABNORMAL HIGH (ref 80.0–100.0)
Monocytes Absolute: 0.7 10*3/uL (ref 0.1–1.0)
Monocytes Relative: 12 %
Neutro Abs: 4 10*3/uL (ref 1.7–7.7)
Neutrophils Relative %: 67 %
Platelet Count: 219 10*3/uL (ref 150–400)
RBC: 3.25 MIL/uL — ABNORMAL LOW (ref 3.87–5.11)
RDW: 13.5 % (ref 11.5–15.5)
WBC Count: 5.9 10*3/uL (ref 4.0–10.5)
nRBC: 0 % (ref 0.0–0.2)

## 2021-05-08 LAB — CMP (CANCER CENTER ONLY)
ALT: 11 U/L (ref 0–44)
AST: 18 U/L (ref 15–41)
Albumin: 3.4 g/dL — ABNORMAL LOW (ref 3.5–5.0)
Alkaline Phosphatase: 53 U/L (ref 38–126)
Anion gap: 7 (ref 5–15)
BUN: 17 mg/dL (ref 8–23)
CO2: 29 mmol/L (ref 22–32)
Calcium: 9.2 mg/dL (ref 8.9–10.3)
Chloride: 103 mmol/L (ref 98–111)
Creatinine: 1.05 mg/dL — ABNORMAL HIGH (ref 0.44–1.00)
GFR, Estimated: 50 mL/min — ABNORMAL LOW (ref >60)
Glucose, Bld: 121 mg/dL — ABNORMAL HIGH (ref 70–99)
Potassium: 3.7 mmol/L (ref 3.5–5.1)
Sodium: 139 mmol/L (ref 135–145)
Total Bilirubin: 1 mg/dL (ref 0.3–1.2)
Total Protein: 7 g/dL (ref 6.5–8.1)

## 2021-05-08 LAB — TSH: TSH: 0.63 u[IU]/mL (ref 0.350–4.500)

## 2021-05-08 MED ORDER — SODIUM CHLORIDE 0.9 % IV SOLN: Freq: Once | INTRAVENOUS | Status: AC

## 2021-05-08 MED ORDER — SODIUM CHLORIDE 0.9 % IV SOLN
350.0000 mg | Freq: Once | INTRAVENOUS | Status: AC
  Administered 2021-05-08: 350 mg via INTRAVENOUS
  Filled 2021-05-08: qty 7

## 2021-05-08 NOTE — Progress Notes (Signed)
Hematology and Oncology Follow Up Visit ? ?[name] ?[id number] ?1925-06-30 88 y.o. ?05/08/2021 9:24 AM ?[name], [name], MDPlotnikov, [name] Lacks, MD  ? ?Principle Diagnosis: 88 year old woman with locally advanced squamous cell carcinoma of the lower extremity diagnosed in June 2022. ? ? ?Prior Therapy: ? ?She received Libtayo for 5 cycles concluded in September 2022. ? ?Current therapy: ? ?Libtayo 350 mg total dose restarted on March 26, 2021.  She returns for the next cycle of therapy. ? ?Interim History: [name] presents today for a follow-up evaluation.  Since last visit, she reports no major changes in her health.  She has tolerated last infusion without any complications.  She denies any nausea, vomiting or diarrhea.  She has reported loose bowel habits at times.  She denies any recent hospitalizations or illnesses.  Her performance status and quality of life remains unchanged.  She denies any increased pain in her lower extremities. ? ? ? ? ?Medications: Updated on review. ?Current Outpatient Medications  ?Medication Sig Dispense Refill  ? acetaminophen (TYLENOL) 500 MG tablet Take 500 mg by mouth daily as needed for mild pain.     ? apixaban (ELIQUIS) 5 MG TABS tablet TAKE 1 TABLET BY MOUTH TWICE DAILY 60 tablet 5  ? B Complex-C (B-COMPLEX WITH VITAMIN C) tablet Take 1 tablet by mouth daily.    ? busPIRone (BUSPAR) 15 MG tablet TAKE 1 TABLET BY MOUTH TWICE DAILY 180 tablet 3  ? Cholecalciferol (VITAMIN D3) 1000 UNITS tablet Take 1,000 Units by mouth daily.    ? colestipol (COLESTID) 1 g tablet Take 1 tablet (1 g total) by mouth in the morning. 60 tablet 12  ? diltiazem (CARDIZEM CD) 120 MG 24 hr capsule TAKE 1 CAPSULE BY MOUTH ONCE DAILY 30 capsule 11  ? doxycycline (VIBRA-TABS) 100 MG tablet TAKE 1 TABLET BY MOUTH ONCE DAILY 30 tablet 11  ? famotidine (PEPCID) 20 MG tablet Take 20 mg by mouth daily.    ? furosemide (LASIX) 40 MG tablet Take 1 tablet (40 mg total) by mouth 2 (two) times daily. 60  tablet 5  ? gabapentin (NEURONTIN) 100 MG capsule TAKE 1 CAPSULE BY MOUTH EVERY NIGHT AT BEDTIME *DO NOT CRUSH OR CHEW* **NOTE NEW INSTRUCTIONS* 30 capsule 5  ? hydrocortisone 2.5 % lotion Apply topically 3 (three) times daily. 240 mL 2  ? levothyroxine (SYNTHROID) 25 MCG tablet TAKE 1 TABLET BY MOUTH ONCE DAILY FOR THYROID *TAKE ON AN EMPTY STOMACH* 30 tablet 11  ? linaclotide (LINZESS) 290 MCG CAPS capsule TAKE 1 TABLET BY MOUTH ONCE DAILY *TAKE 30 MINUTES PRIOR TO FIRST MEAL* *TAKE ON AN EMPTY STOMACH* *DO NOT CRUSH OR CHEW* 30 capsule 5  ? loperamide (IMODIUM) 2 MG capsule Take 1 capsule (2 mg total) by mouth as needed for diarrhea or loose stools. Take 1 tab with first loose stool then one tab after each loose stool for maximum dose of 8 tabs daily. Pt to call the office if need for greater then 8 tabs a day 60 capsule 0  ? methylPREDNISolone (MEDROL DOSEPAK) 4 MG TBPK tablet As directed 21 tablet 0  ? metoprolol succinate (TOPROL-XL) 50 MG 24 hr tablet TAKE 1/2 TABLET = 25 MG BY MOUTH TWICE DAILY. TAKE WITH OR IMMEDIATELY FOLLOWING A MEAL 30 tablet 10  ? Multiple Vitamins-Minerals (MULTIVITAMIN WITH MINERALS) tablet Take 1 tablet by mouth daily.    ? polyethylene glycol (MIRALAX) 17 g packet Take 17 g by mouth daily. 30 each 0  ?  potassium chloride SA (KLOR-CON M) 20 MEQ tablet TAKE 1 TABLET BY MOUTH ONCE DAILY  ALONG W/ FUROSEMIDE AS NEED *TAKE WITH FOOD* *DO NOT CRUSH OR CHEW* *MAY DISSOLVE* 30 tablet 11  ? potassium chloride SA (KLOR-CON M) 20 MEQ tablet Take 1 tablet (20 mEq total) by mouth daily. Take along w/ Furosemide as need 30 tablet 3  ? prochlorperazine (COMPAZINE) 5 MG tablet Take 1 tablet (5 mg total) by mouth every 8 (eight) hours as needed for nausea or vomiting. 20 tablet 1  ? temazepam (RESTORIL) 15 MG capsule TAKE 1 CAPSULE BY MOUTH EVERY NIGHT AT BEDTIME AS NEEDED FOR SLEEP 30 capsule 3  ? ?No current facility-administered medications for this visit.  ? ? ? ?Allergies:  ?Allergies   ?Allergen Reactions  ? Zosyn [Piperacillin Sod-Tazobactam So] Hives, Itching, Rash and Other (See Comments)  ?  Morbilliform eruptions with itching- looks like Measles  ? Atorvastatin   ?  myalgias  ? Atenolol Nausea And Vomiting  ? Clarithromycin Nausea And Vomiting  ? Codeine Sulfate Nausea Only  ? Levaquin [Levofloxacin] Nausea Only  ? Macrodantin Other (See Comments)  ?  UNSPECIFIED   ? Oxycodone-Acetaminophen Nausea And Vomiting  ? ? ? ? ?Physical Exam: ?Blood pressure 127/80, pulse 64, temperature 98.1 ?F (36.7 ?C), temperature source Tympanic, resp. rate 17, weight 149 lb 8 oz (67.8 kg), SpO2 98 %. ? ? ?ECOG: 2 ? ? ?General appearance: Alert, awake without any distress. ?Head: Atraumatic without abnormalities ?Oropharynx: Without any thrush or ulcers. ?Eyes: No scleral icterus. ?Lymph nodes: No lymphadenopathy noted in the cervical, supraclavicular, or axillary nodes ?Heart:regular rate and rhythm, without any murmurs or gallops.   Bilateral lower extremity edema noted. ?Lung: Clear to auscultation without any rhonchi, wheezes or dullness to percussion. ?Abdomin: Soft, nontender without any shifting dullness or ascites. ?Musculoskeletal: No clubbing or cyanosis. ?Neurological: No motor or sensory deficits. ?Skin: 2 raised lesions noted on her right upper arm.  Lower extremity skin lesions noted.  Wounds are currently covered and wrapped. ? ? ? ? ?Lab Results: ?Lab Results  ?Component Value Date  ? WBC 5.5 04/17/2021  ? HGB 11.6 (L) 04/17/2021  ? HCT 36.5 04/17/2021  ? MCV 106.7 (H) 04/17/2021  ? PLT 254 04/17/2021  ? ?  Chemistry   ?   ?Component Value Date/Time  ? NA 138 04/17/2021 1130  ? NA 134 08/11/2018 1425  ? NA 137 07/28/2016 1334  ? K 4.2 04/17/2021 1130  ? K 3.8 07/28/2016 1334  ? CL 99 04/17/2021 1130  ? CO2 31 04/17/2021 1130  ? CO2 28 07/28/2016 1334  ? BUN 23 04/17/2021 1130  ? BUN 20 08/11/2018 1425  ? BUN 9.5 07/28/2016 1334  ? CREATININE 1.35 (H) 04/17/2021 1130  ? CREATININE 1.0 07/28/2016  1334  ?    ?Component Value Date/Time  ? CALCIUM 9.5 04/17/2021 1130  ? CALCIUM 9.6 07/28/2016 1334  ? ALKPHOS 64 04/17/2021 1130  ? ALKPHOS 96 07/28/2016 1334  ? AST 19 04/17/2021 1130  ? AST 21 07/28/2016 1334  ? ALT 11 04/17/2021 1130  ? ALT 24 07/28/2016 1334  ? BILITOT 0.9 04/17/2021 1130  ? BILITOT 1.21 (H) 07/28/2016 1334  ?  ? ? ? ? ? ?Impression and Plan: ? ?88 year old with: ? ?1.  Squamous cell carcinoma of the lower extremity diagnosed in June 2022.  She was found to have locally advanced recurrent disease. ? ? ?She continues to be on Libtayo without any major complications.  Risks and benefits of continuing this treatment were reviewed.  Dermatological toxicity as well as autoimmune complications were reiterated.  It is unclear if she is benefiting from this treatment at this time but certainly it is too soon to make a judgment.  I recommended continuing the same doses scheduled for the time being. ? ?This was discussed with the patient and her daughter via phone today and they are all in agreement. ? ? ? ? ?  ?2.  Breast cancer: No additional treatment noted at this time. ? ?3.  Nonhealing wounds in the lower extremities: She continues to follow at the wound clinic. ? ?  ?  ?4.  Follow-up: In 3 weeks for the next cycle of Libtayo. ?  ?  ? 30 minutes were dedicated to this visit.  The time was spent on reviewing laboratory data, disease status update and outlining future plan of care discussion. ? ? ?Zola Button, MD ?4/19/20239:24 AM ? ?

## 2021-05-08 NOTE — Patient Instructions (Signed)
[facility]  Discharge Instructions: ?Thank you for choosing [facility] to provide your oncology and hematology care.  ? ?If you have a lab appointment with the Cancer Center, please go directly to the Cancer Center and check in at the registration area. ?  ?Wear comfortable clothing and clothing appropriate for easy access to any Portacath or PICC line.  ? ?We strive to give you quality time with your provider. You may need to reschedule your appointment if you arrive late (15 or more minutes).  Arriving late affects you and other patients whose appointments are after yours.  Also, if you miss three or more appointments without notifying the office, you may be dismissed from the clinic at the provider?s discretion.    ?  ?For prescription refill requests, have your pharmacy contact our office and allow 72 hours for refills to be completed.   ? ?Today you received the following chemotherapy and/or immunotherapy agent: Libtayo    ?  ?To help prevent nausea and vomiting after your treatment, we encourage you to take your nausea medication as directed. ? ?BELOW ARE SYMPTOMS THAT SHOULD BE REPORTED IMMEDIATELY: ?*FEVER GREATER THAN 100.4 F (38 ?C) OR HIGHER ?*CHILLS OR SWEATING ?*NAUSEA AND VOMITING THAT IS NOT CONTROLLED WITH YOUR NAUSEA MEDICATION ?*UNUSUAL SHORTNESS OF BREATH ?*UNUSUAL BRUISING OR BLEEDING ?*URINARY PROBLEMS (pain or burning when urinating, or frequent urination) ?*BOWEL PROBLEMS (unusual diarrhea, constipation, pain near the anus) ?TENDERNESS IN MOUTH AND THROAT WITH OR WITHOUT PRESENCE OF ULCERS (sore throat, sores in mouth, or a toothache) ?UNUSUAL RASH, SWELLING OR PAIN  ?UNUSUAL VAGINAL DISCHARGE OR ITCHING  ? ?Items with * indicate a potential emergency and should be followed up as soon as possible or go to the Emergency Department if any problems should occur. ? ?Please show the CHEMOTHERAPY ALERT CARD or IMMUNOTHERAPY ALERT CARD at check-in to the  Emergency Department and triage nurse. ? ?Should you have questions after your visit or need to cancel or reschedule your appointment, please contact [facility]  Dept: [phone]  and follow the prompts.  Office hours are 8:00 a.m. to 4:30 p.m. Monday - Friday. Please note that voicemails left after 4:00 p.m. may not be returned until the following business day.  We are closed weekends and major holidays. You have access to a nurse at all times for urgent questions. Please call the main number to the clinic Dept: [phone] and follow the prompts. ? ? ?For any non-urgent questions, you may also contact your provider using MyChart. We now offer e-Visits for anyone 18 and older to request care online for non-urgent symptoms. For details visit [email or web address]. ?  ?Also download the MyChart app! Go to the app store, search "MyChart", open the app, select [facility], and log in with your MyChart username and password. ? ?Due to Covid, a mask is required upon entering the hospital/clinic. If you do not have a mask, one will be given to you upon arrival. For doctor visits, patients may have 1 support person aged 18 or older with them. For treatment visits, patients cannot have anyone with them due to current Covid guidelines and our immunocompromised population.  ? ?

## 2021-05-09 ENCOUNTER — Encounter (HOSPITAL_BASED_OUTPATIENT_CLINIC_OR_DEPARTMENT_OTHER): Payer: Medicare Other | Admitting: Internal Medicine

## 2021-05-09 DIAGNOSIS — C4492 Squamous cell carcinoma of skin, unspecified: Secondary | ICD-10-CM

## 2021-05-09 DIAGNOSIS — E039 Hypothyroidism, unspecified: Secondary | ICD-10-CM | POA: Diagnosis not present

## 2021-05-09 DIAGNOSIS — I890 Lymphedema, not elsewhere classified: Secondary | ICD-10-CM | POA: Diagnosis not present

## 2021-05-09 DIAGNOSIS — L97812 Non-pressure chronic ulcer of other part of right lower leg with fat layer exposed: Secondary | ICD-10-CM | POA: Diagnosis not present

## 2021-05-09 DIAGNOSIS — I87313 Chronic venous hypertension (idiopathic) with ulcer of bilateral lower extremity: Secondary | ICD-10-CM | POA: Diagnosis not present

## 2021-05-09 DIAGNOSIS — L97822 Non-pressure chronic ulcer of other part of left lower leg with fat layer exposed: Secondary | ICD-10-CM | POA: Diagnosis not present

## 2021-05-09 NOTE — Progress Notes (Signed)
[name] Jenny A. ([id number]) ?, ?Visit Report for 05/09/2021 ?Chief Complaint Document Details ?Patient Name: Date of Service: ?[name], [name] 05/09/2021 1:30 PM ?Medical Record Number: [id number] ?Patient Account Number: [id number] ?Date of Birth/Sex: Treating RN: ?April 24, 1925 (88 y.o. F) Bell, [name] ?Primary Care Provider: [name] Other Clinician: ?Referring Provider: ?Treating Provider/Extender: Kalman Shan ?[name], [name] ?Weeks in Treatment: 47 ?Information Obtained from: Patient ?Chief Complaint ?Bilateral lower extremity wounds that have been biopsied and positive for squamous cell carcinoma ?Bilateral lower extremity wounds due to chronic venous insufficiency/lymphedema ?Electronic Signature(s) ?Signed: 05/09/2021 3:46:31 PM By: Kalman Shan DO ?Entered By: Kalman Shan on 05/09/2021 14:11:08 ?-------------------------------------------------------------------------------- ?HPI Details ?Patient Name: Date of Service: ?[name], [name] 05/09/2021 1:30 PM ?Medical Record Number: [id number] ?Patient Account Number: [id number] ?Date of Birth/Sex: Treating RN: ?1925/11/16 (88 y.o. F) Bell, [name] ?Primary Care Provider: [name] Other Clinician: ?Referring Provider: ?Treating Provider/Extender: Kalman Shan ?[name], [name] ?Weeks in Treatment: 47 ?History of Present Illness ?Location: left leg ?HPI Description: Admission 5/23 ?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 She states that the wound on ?the right has been present for a year. The 1 on the left has opened up 1 month ago. She is followed with oncology for this issue as she had biopsies that ?showed squamous cell carcinoma. She is also seeing radiation oncology for treatment  options. She presents today because she would like for her wounds to be ?healed by [place]. She currently denies signs of infection. ?6/1; patient presents for 1 week follow-up. She states she has tolerated the leg wraps well. She states these do not bother her and is happy to continue with ?them. She is scheduled to see her oncologist today to go over treatment options for the bilateral lower extremity squamous cell carcinoma. Radiation is ?currently not a recommended option. Patient states she overall feels well. ?6/22; patient presents for 3-week follow-up. She has tolerated the wraps well until her last wrap where she states they were uncomfortable. She attributes this to ?the home health nurse. She denies signs of infection. She has started her first treatment of antibody infusions for her Bilateral lower extremity squamous cell ?carcinoma. She has no complaints today. ?7/21; patient presents for 1 month follow-up. Unfortunately she has not had good experience with her wrap changes with home health. She would like to do her ?own dressing changes. She continues to do her antibody infusions. She denies signs of infection. ?7/28; patient presents for 1 week follow-up. At last clinic visit she was switched to daily dressing changes due to issues with the wrap and home health placing ?them. Unfortunately she has developed weeping to her legs bilaterally. She would like to be placed in wraps today. She would also like to follow with [place] weekly ?for wrap changes instead of having home health change them. She denies signs of infection. ?8/4; patient presents for 1 week follow-up. She has tolerated the Kerlix/Coban wraps well. She no longer has weeping to her legs. She took the wrap off 1 day ?before coming in to be able to take a shower. She has no issues or complaints today. She denies signs of infection. ?8/18; patient presents for follow-up. Patient has tolerated the wraps well. She brought her Velcro compression wraps  today. She has no  issues or complaints ?today. She had her chemotherapy infusion yesterday without issues. She denies signs of infection. ?8/25; patient presents for follow-up. She used her juxta lite compressions for the past week. It is unclear if she is able to put these on correctly since she ?states she has a hard time getting them to look right. She reports 2 open wounds. She currently denies signs of infection. ?9/1; patient presents for 1 week follow-up. She has 1 open wound. She tolerated the compression wraps well. She currently denies signs of infection. ?9/8; patient presents for 1 week follow-up. She has 2 open wounds 1 on each leg. She has tolerated the compression wrap well. She currently denies signs of ?infection. ?9/15; patient presents for 1 week follow-up. She now has 3 wounds. 2 on the left and 1 on the right. She continues to tolerate the compression wrap well. She ?currently denies signs of infection. She has obtained furosemide by her primary care physician and would like to discuss when to take this. ?9/22; patient presents for 1 week follow-up. She has scattered wounds on her lower extremities bilaterally. She did take furosemide twice in the past week. She ?does not recall having to urinate more frequently. She tolerated the 3 layer compression well. She denies signs of infection. ?10/13; patient has not been here recently because of [place]. Apparently the facility was only putting gauze on her legs. This is a patient I do not normally see. ?She has a history of squamous cell carcinoma bilaterally on her anterior lower legs followed for a period of time by Dr. Ronnald Ramp at [facility] dermatology. She ?is quite convincing that she did not have radiation to her lower legs. It is likely she also has significant chronic venous insufficiency stasis dermatitis. We ?have been using silver alginate under kerlix Coban. She has obvious open areas medially on the right and areas on the left. She also has  areas of extensive ?dry flaking adherent areas on the right and to a lesser extent on the left anterior. Nodular areas on the left lateral lower leg left posterior calf and right mid calf ?medially. ?10/21; patient presents for follow-up. She has no issues or complaints today. She has tolerated the 3 layer compression well bilaterally. She denies signs of ?infection. ?10/27; patient presents for follow-up. She continues to tolerate the 3 layer compression wrap well. She denies signs of infection. ?11/30; patient presents for follow-up. She has no issues or complaints today. ?11/10; patient arrived in clinic today for nurse visit accompanied by her daughter from [place]. The daughter had multiple questions so we turned this into ?doctors visit. Apparently after the last time I saw this woman in October she went to see Dr. Ronnald Ramp but he did not take the wraps off. She previously was ?treated with Cemiplimab for her squamous cell carcinoma. She did not receive radiation to her legs. I had wanted Dr. Ronnald Ramp to look at this because of the ?exceptionally damaged skin on her lower legs bilaterally. She has odd looking wounds on the left medial lower leg also extending posteriorly which we have not ?made a lot of progress on. But she also has a raised hyperkeratotic nodules on the right anterior tibial area plaques on the left medial lower thigh. These are not ?areas that are under compression. We have been using silver alginate on any open areas Liberal TCA under 3 layer compression. ?11/17; patient presents for follow-up. She had her wraps taken off yesterday for her dermatology appointment. She reports excessive  weeping to her legs ?bilaterally after the wraps were taken off. She currently denies signs of infection. ?12/12/2020 upon evaluation today patient appears to be doing about the same in regard to her wounds. She was actually started on Cipro after having biopsies ?apparently at her dermatology clinic she does not  have the results back from the actual biopsy but the culture did return and apparently they placed her on the ?Cipro she started that just this morning. Other than that her legs appear to be doing okay at

## 2021-05-09 NOTE — Progress Notes (Signed)
Shafran Nekita A. ([id number]) ?, ?Visit Report for 05/09/2021 ?Arrival Information Details ?Patient Name: Date of Service: ?[name], [name] 05/09/2021 1:30 PM ?Medical Record Number: [id number] ?Patient Account Number: [id number] ?Date of Birth/Sex: Treating RN: ?28-Aug-1925 (88 y.o. F) Deaton, Bobbi ?Primary Care Masami Plata: [name] Other Clinician: ?Referring Shamell Hittle: ?Treating Shonique Pelphrey/Extender: Kalman Shan ?[name], [name] ?Weeks in Treatment: 47 ?Visit Information History Since Last Visit ?Added or deleted any medications: No ?Patient Arrived: [name] ?Any new allergies or adverse reactions: No ?Arrival Time: 13:42 ?Had a fall or experienced change in No ?Accompanied By: self ?activities of daily living that may affect ?Transfer Assistance: None ?risk of falls: ?Patient Identification Verified: Yes ?Signs or symptoms of abuse/neglect since last visito No ?Secondary Verification Process Completed: Yes ?Hospitalized since last visit: No ?Patient Requires Transmission-Based No ?Implantable device outside of the clinic excluding No ?Precautions: ?cellular tissue based products placed in the center ?Patient Has Alerts: Yes ?since last visit: ?Patient Alerts: R ABI = Non compressible ?Has Dressing in Place as Prescribed: Yes ?L ABI = Non compressible ?Has Compression in Place as Prescribed: Yes ?Pain Present Now: No ?Notes ?juxtalite HD new ones have arrived and patient brought in today. Per patient was sick over weekend and did not pump much since weekend. Per patient did not ?take lasix either last night or today. only pumped today for 30 minutes. Explained at length the importance of pumping and lasix. ?Electronic Signature(s) ?Signed: 05/09/2021 5:36:27 PM By: Deon Pilling RN, BSN ?Entered By: Deon Pilling on 05/09/2021 14:06:58 ?-------------------------------------------------------------------------------- ?Compression Therapy Details ?Patient Name: Date of Service: ?[name], [name] 05/09/2021  1:30 PM ?Medical Record Number: [id number] ?Patient Account Number: [id number] ?Date of Birth/Sex: Treating RN: ?06/25/1925 (88 y.o. F) Deaton, Bobbi ?Primary Care Ezekeil Bethel: [name] Other Clinician: ?Referring Grayer Sproles: ?Treating Adrian Specht/Extender: Kalman Shan ?[name], [name] ?Weeks in Treatment: 47 ?Compression Therapy Performed for Wound Assessment: Wound #19 Right,Circumferential Lower Leg ?Performed By: Clinician Deon Pilling, RN ?Compression Type: Three Layer ?Post Procedure Diagnosis ?Same as Pre-procedure ?Electronic Signature(s) ?Signed: 05/09/2021 5:36:27 PM By: Deon Pilling RN, BSN ?Entered By: Deon Pilling on 05/09/2021 14:07:22 ?-------------------------------------------------------------------------------- ?Compression Therapy Details ?Patient Name: ?Date of Service: ?[name], [name] 05/09/2021 1:30 PM ?Medical Record Number: [id number] ?Patient Account Number: [id number] ?Date of Birth/Sex: ?Treating RN: ?16-Jun-1925 (88 y.o. F) Deaton, Bobbi ?Primary Care Anfernee Peschke: [name] ?Other Clinician: ?Referring Zakara Parkey: ?Treating Dontea Corlew/Extender: Kalman Shan ?[name], [name] ?Weeks in Treatment: 47 ?Compression Therapy Performed for Wound Assessment: Wound #[id number] Left,Circumferential Lower Leg ?Performed By: Clinician Deon Pilling, RN ?Compression Type: Three Layer ?Post Procedure Diagnosis ?Same as Pre-procedure ?Electronic Signature(s) ?Signed: 05/09/2021 5:36:27 PM By: Deon Pilling RN, BSN ?Entered By: Deon Pilling on 05/09/2021 14:07:22 ?-------------------------------------------------------------------------------- ?Encounter Discharge Information Details ?Patient Name: ?Date of Service: ?[name], [name] 05/09/2021 1:30 PM ?Medical Record Number: [id number] ?Patient Account Number: [id number] ?Date of Birth/Sex: ?Treating RN: ?10-04-1925 (88 y.o. F) Deaton, Bobbi ?Primary Care Viviann Broyles: [name] ?Other Clinician: ?Referring Jeramie Scogin: ?Treating  Monta Maiorana/Extender: Kalman Shan ?[name], [name] ?Weeks in Treatment: 47 ?Encounter Discharge Information Items ?Discharge Condition: Stable ?Ambulatory Status: [name] ?Discharge Destination: Home ?Transportation: Private Auto ?Accompanied By: self ?Schedule Follow-up Appointment: Yes ?Clinical Summary of Care: ?Electronic Signature(s) ?Signed: 05/09/2021 5:36:27 PM By: Deon Pilling RN, BSN ?Entered By: Deon Pilling on 05/09/2021 14:13:31 ?-------------------------------------------------------------------------------- ?Lower Extremity Assessment Details ?Patient Name: ?Date of Service: ?[name], [name] 05/09/2021 1:30 PM ?Medical Record Number: [id number] ?Patient Account Number: [id number] ?Date of Birth/Sex: ?Treating RN: ?Mar 20, 1925 (  88 y.o. F) Deaton, Bobbi ?Primary Care Aarica Wax: [name] ?Other Clinician: ?Referring Carra Brindley: ?Treating Ben Habermann/Extender: Kalman Shan ?[name], [name] ?Weeks in Treatment: 47 ?Edema Assessment ?Assessed: [Left: Yes] [Right: Yes] ?Edema: [Left: Yes] [Right: Yes] ?Calf ?Left: Right: ?Point of Measurement: 30 cm From Medial Instep 33 cm 30 cm ?Ankle ?Left: Right: ?Point of Measurement: 9 cm From Medial Instep 23 cm 21 cm ?Vascular Assessment ?Pulses: ?Dorsalis Pedis ?Palpable: [Left:Yes] [Right:Yes] ?Electronic Signature(s) ?Signed: 05/09/2021 5:36:27 PM By: Deon Pilling RN, BSN ?Entered By: Deon Pilling on 05/09/2021 13:51:18 ?-------------------------------------------------------------------------------- ?Multi Wound Chart Details ?Patient Name: ?Date of Service: ?[name], [name] 05/09/2021 1:30 PM ?Medical Record Number: [id number] ?Patient Account Number: [id number] ?Date of Birth/Sex: ?Treating RN: ?1925-05-10 (88 y.o. F) Deaton, Bobbi ?Primary Care Minoru Chap: [name] ?Other Clinician: ?Referring Taeshawn Helfman: ?Treating Thurley Francesconi/Extender: Kalman Shan ?[name], [name] ?Weeks in Treatment: 47 ?Vital Signs ?Height(in):  65 ?Pulse(bpm): 61 ?Weight(lbs): 163 ?Blood Pressure(mmHg): 117/76 ?Body Mass Index(BMI): 27.1 ?Temperature(??F): 98.4 ?Respiratory Rate(breaths/min): 20 ?Photos: [7RR:No Photos] [N/A:N/A] ?Right, Circumferential Lower Leg Left, Circumferential Lower Leg N/A ?Wound Location: ?Blister Blister N/A ?Wounding Event: ?Venous Leg Ulcer Malignant Wound N/A ?Primary Etiology: ?Anemia, Arrhythmia, Congestive Heart Anemia, Arrhythmia, Congestive Heart N/A ?Comorbid History: ?Failure, Hypertension, Colitis, Failure, Hypertension, Colitis, ?Osteoarthritis Osteoarthritis ?02/06/2021 04/20/2020 N/A ?Date Acquired: ?55 47 N/A ?Weeks of Treatment: ?Open Open N/A ?Wound Status: ?No Yes N/A ?Wound Recurrence: ?Yes Yes N/A ?Clustered Wound: ?1 4 N/A ?Clustered Quantity: ?0.1x0.1x0.1 9.5x14x0.1 N/A ?Measurements L x W x D (cm) ?0.008 104.458 N/A ?A (cm?) : ?rea ?0.001 10.446 N/A ?Volume (cm?) : ?100.00% -3301.40% N/A ?% Reduction in Area: ?100.00% -3302.60% N/A ?% Reduction in Volume: ?Full Thickness Without Exposed Full Thickness Without Exposed N/A ?Classification: ?Support Structures Support Structures ?None Present Large N/A ?Exudate Amount: ?N/A Serosanguineous N/A ?Exudate Type: ?N/A red, brown N/A ?Exudate Color: ?Distinct, outline attached Distinct, outline attached N/A ?Wound Margin: ?None Present (0%) Large (67-100%) N/A ?Granulation Amount: ?N/A Red, Pink N/A ?Granulation Quality: ?None Present (0%) Small (1-33%) N/A ?Necrotic Amount: ?Fascia: No ?Fat Layer (Subcutaneous Tissue): Yes N/A ?Exposed Structures: ?Fat Layer (Subcutaneous Tissue): No ?Fascia: No ?Tendon: No ?Tendon: No ?Muscle: No ?Muscle: No ?Joint: No ?Joint: No ?Bone: No ?Bone: No ?Large (67-100%) Large (67-100%) N/A ?Epithelialization: ?Compression Therapy Compression Therapy N/A ?Procedures Performed: ?Treatment Notes ?Electronic Signature(s) ?Signed: 05/09/2021 3:46:31 PM By: Kalman Shan DO ?Signed: 05/09/2021 5:36:27 PM By: Deon Pilling RN, BSN ?Entered  By: Kalman Shan on 05/09/2021 14:10:57 ?-------------------------------------------------------------------------------- ?Multi-Disciplinary Care Plan Details ?Patient Name: ?Date of Service: ?[name], Cresta A. 4/20/

## 2021-05-16 ENCOUNTER — Encounter (HOSPITAL_BASED_OUTPATIENT_CLINIC_OR_DEPARTMENT_OTHER): Payer: Medicare Other | Admitting: Internal Medicine

## 2021-05-17 ENCOUNTER — Encounter (HOSPITAL_BASED_OUTPATIENT_CLINIC_OR_DEPARTMENT_OTHER): Payer: Medicare Other | Admitting: Internal Medicine

## 2021-05-17 DIAGNOSIS — I87313 Chronic venous hypertension (idiopathic) with ulcer of bilateral lower extremity: Secondary | ICD-10-CM | POA: Diagnosis not present

## 2021-05-17 DIAGNOSIS — I890 Lymphedema, not elsewhere classified: Secondary | ICD-10-CM | POA: Diagnosis not present

## 2021-05-17 DIAGNOSIS — I872 Venous insufficiency (chronic) (peripheral): Secondary | ICD-10-CM | POA: Diagnosis not present

## 2021-05-17 DIAGNOSIS — C4492 Squamous cell carcinoma of skin, unspecified: Secondary | ICD-10-CM | POA: Diagnosis not present

## 2021-05-17 DIAGNOSIS — E039 Hypothyroidism, unspecified: Secondary | ICD-10-CM | POA: Diagnosis not present

## 2021-05-17 DIAGNOSIS — L97822 Non-pressure chronic ulcer of other part of left lower leg with fat layer exposed: Secondary | ICD-10-CM | POA: Diagnosis not present

## 2021-05-17 DIAGNOSIS — L97812 Non-pressure chronic ulcer of other part of right lower leg with fat layer exposed: Secondary | ICD-10-CM | POA: Diagnosis not present

## 2021-05-17 NOTE — Progress Notes (Signed)
[name] Detrice A. ([id number]) ?, ?Visit Report for 05/17/2021 ?HPI Details ?Patient Name: Date of Service: ?[name], [name] 05/17/2021 1:30 PM ?Medical Record Number: [id number] ?Patient Account Number: [id number] ?Date of Birth/Sex: Treating RN: ?10-27-1925 (88 y.o. F) Deaton, Bobbi ?Primary Care Provider: [name] Other Clinician: ?Referring Provider: ?Treating Provider/Extender: Linton Ham ?Bell, [name] Lacks ?Weeks in Treatment: 48 ?History of Present Illness ?Location: left leg ?HPI Description: Admission 5/23 ?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 She states that the wound on ?the right has been present for a year. The 1 on the left has opened up 1 month ago. She is followed with oncology for this issue as she had biopsies that ?showed squamous cell carcinoma. She is also seeing radiation oncology for treatment options. She presents today because she would like for her wounds to be ?healed by [place]. She currently denies signs of infection. ?6/1; patient presents for 1 week follow-up. She states she has tolerated the leg wraps well. She states these do not bother her and is happy to continue with ?them. She is scheduled to see her oncologist today to go over treatment options for the bilateral lower extremity squamous cell carcinoma. Radiation is ?currently not a recommended option. Patient states she overall feels well. ?6/22; patient presents for 3-week follow-up. She has tolerated the wraps well until her last wrap where she states they were uncomfortable. She attributes this to ?the home health nurse. She denies signs of infection. She has started her first treatment of antibody infusions for her Bilateral lower extremity squamous cell ?carcinoma. She has no  complaints today. ?7/21; patient presents for 1 month follow-up. Unfortunately she has not had good experience with her wrap changes with home health. She would like to do her ?own dressing changes. She continues to do her antibody infusions. She denies signs of infection. ?7/28; patient presents for 1 week follow-up. At last clinic visit she was switched to daily dressing changes due to issues with the wrap and home health placing ?them. Unfortunately she has developed weeping to her legs bilaterally. She would like to be placed in wraps today. She would also like to follow with [place] weekly ?for wrap changes instead of having home health change them. She denies signs of infection. ?8/4; patient presents for 1 week follow-up. She has tolerated the Kerlix/Coban wraps well. She no longer has weeping to her legs. She took the wrap off 1 day ?before coming in to be able to take a shower. She has no issues or complaints today. She denies signs of infection. ?8/18; patient presents for follow-up. Patient has tolerated the wraps well. She brought her Velcro compression wraps today. She has no issues or complaints ?today. She had her chemotherapy infusion yesterday without issues. She denies signs of infection. ?8/25; patient presents for follow-up. She used her juxta lite compressions for the past week. It is unclear if she is able to put these on correctly since she ?states she has a hard time getting them to look right. She reports 2 open wounds. She currently denies signs of infection. ?9/1; patient presents for 1 week follow-up. She has 1 open wound. She tolerated the compression wraps well. She currently denies signs of infection. ?9/8; patient presents for 1 week follow-up. She has 2 open wounds  1 on each leg. She has tolerated the compression wrap well. She currently denies signs of ?infection. ?9/15; patient presents for 1 week follow-up. She now has 3 wounds. 2 on the left and 1 on the right. She continues to  tolerate the compression wrap well. She ?currently denies signs of infection. She has obtained furosemide by her primary care physician and would like to discuss when to take this. ?9/22; patient presents for 1 week follow-up. She has scattered wounds on her lower extremities bilaterally. She did take furosemide twice in the past week. She ?does not recall having to urinate more frequently. She tolerated the 3 layer compression well. She denies signs of infection. ?10/13; patient has not been here recently because of [place]. Apparently the facility was only putting gauze on her legs. This is a patient I do not normally see. ?She has a history of squamous cell carcinoma bilaterally on her anterior lower legs followed for a period of time by Dr. Ronnald Ramp at [facility] dermatology. She ?is quite convincing that she did not have radiation to her lower legs. It is likely she also has significant chronic venous insufficiency stasis dermatitis. We ?have been using silver alginate under kerlix Coban. She has obvious open areas medially on the right and areas on the left. She also has areas of extensive ?dry flaking adherent areas on the right and to a lesser extent on the left anterior. Nodular areas on the left lateral lower leg left posterior calf and right mid calf ?medially. ?10/21; patient presents for follow-up. She has no issues or complaints today. She has tolerated the 3 layer compression well bilaterally. She denies signs of ?infection. ?10/27; patient presents for follow-up. She continues to tolerate the 3 layer compression wrap well. She denies signs of infection. ?11/30; patient presents for follow-up. She has no issues or complaints today. ?11/10; patient arrived in clinic today for nurse visit accompanied by her daughter from [place]. The daughter had multiple questions so we turned this into ?doctors visit. Apparently after the last time I saw this woman in October she went to see Dr. Ronnald Ramp but he did not take the  wraps off. She previously was ?treated with Cemiplimab for her squamous cell carcinoma. She did not receive radiation to her legs. I had wanted Dr. Ronnald Ramp to look at this because of the ?exceptionally damaged skin on her lower legs bilaterally. She has odd looking wounds on the left medial lower leg also extending posteriorly which we have not ?made a lot of progress on. But she also has a raised hyperkeratotic nodules on the right anterior tibial area plaques on the left medial lower thigh. These are not ?areas that are under compression. We have been using silver alginate on any open areas Liberal TCA under 3 layer compression. ?11/17; patient presents for follow-up. She had her wraps taken off yesterday for her dermatology appointment. She reports excessive weeping to her legs ?bilaterally after the wraps were taken off. She currently denies signs of infection. ?12/12/2020 upon evaluation today patient appears to be doing about the same in regard to her wounds. She was actually started on Cipro after having biopsies ?apparently at her dermatology clinic she does not have the results back from the actual biopsy but the culture did return and apparently they placed her on the ?Cipro she started that just this morning. Other than that her legs appear to be doing okay at this time. ?12/1; patient presents for follow-up. She has no issues or complaints today.  She has started Lasix 40 mg daily to help with her bilateral leg swelling. ?12/8; patient presents for follow-up. She has no issues or complaints today. ?12/15; patient presents for 1 week follow-up. She has no issues or complaints today. ?12/22; patient presents for follow-up. She has no issues or complaints today. ?1/5; patient presents for follow-up. She has no issues or complaints today. She has tolerated the compression wraps well. ?1/12; patient presents for follow-up. She is tolerated the compression wrap well and has no issues or complaints today. She  denies signs of infection. ?1/19; patient presents for follow-up. She took the compression wraps off 1 to 2 days ago to take a shower and did not have compression wraps replaced. She ?reports increased blisteri

## 2021-05-20 NOTE — Progress Notes (Signed)
[name] Dericka A. ([id number]) ?, ?Visit Report for 05/17/2021 ?Arrival Information Details ?Patient Name: Date of Service: ?[name], [name] 05/17/2021 1:30 PM ?Medical Record Number: [id number] ?Patient Account Number: [id number] ?Date of Birth/Sex: Treating RN: ?07/03/1925 (88 y.o. F) Deaton, Bobbi ?Primary Care Verdon Ferrante: [name] Other Clinician: ?Referring Kayde Warehime: ?Treating Mart Colpitts/Extender: Linton Ham ?[name], [name] ?Weeks in Treatment: 48 ?Visit Information History Since Last Visit ?Added or deleted any medications: No ?Patient Arrived: [name] ?Any new allergies or adverse reactions: No ?Arrival Time: 14:04 ?Had a fall or experienced change in No ?Accompanied By: self ?activities of daily living that may affect ?Transfer Assistance: None ?risk of falls: ?Patient Identification Verified: Yes ?Signs or symptoms of abuse/neglect since last visito No ?Secondary Verification Process Completed: Yes ?Hospitalized since last visit: No ?Patient Requires Transmission-Based No ?Implantable device outside of the clinic excluding No ?Precautions: ?cellular tissue based products placed in the center ?Patient Has Alerts: Yes ?since last visit: ?Patient Alerts: R ABI = Non compressible ?Has Dressing in Place as Prescribed: Yes ?L ABI = Non compressible ?Pain Present Now: No ?Electronic Signature(s) ?Signed: 05/20/2021 10:04:42 AM By: Sandre Kitty ?Entered By: Sandre Kitty on 05/17/2021 14:04:50 ?-------------------------------------------------------------------------------- ?Compression Therapy Details ?Patient Name: Date of Service: ?[name], [name] 05/17/2021 1:30 PM ?Medical Record Number: [id number] ?Patient Account Number: [id number] ?Date of Birth/Sex: Treating RN: ?Apr 15, 1925 (88 y.o. F) Deaton, Bobbi ?Primary Care Le Ferraz: [name] Other Clinician: ?Referring Esaw Knippel: ?Treating Rett Stehlik/Extender: Linton Ham ?[name], [name] ?Weeks in Treatment: 48 ?Compression Therapy  Performed for Wound Assessment: Wound #19 Right,Circumferential Lower Leg ?Performed By: Clinician Deon Pilling, RN ?Compression Type: Three Layer ?Post Procedure Diagnosis ?Same as Pre-procedure ?Electronic Signature(s) ?Signed: 05/17/2021 4:52:29 PM By: Deon Pilling RN, BSN ?Entered By: Deon Pilling on 05/17/2021 14:45:19 ?-------------------------------------------------------------------------------- ?Compression Therapy Details ?Patient Name: ?Date of Service: ?[name], [name] 05/17/2021 1:30 PM ?Medical Record Number: [id number] ?Patient Account Number: [id number] ?Date of Birth/Sex: ?Treating RN: ?10/20/1925 (88 y.o. F) Deaton, Bobbi ?Primary Care Virlee Stroschein: [name] ?Other Clinician: ?Referring Delesa Kawa: ?Treating Yamen Castrogiovanni/Extender: Linton Ham ?[name], [name] ?Weeks in Treatment: 48 ?Compression Therapy Performed for Wound Assessment: Wound #[id number] Left,Circumferential Lower Leg ?Performed By: Clinician Deon Pilling, RN ?Compression Type: Three Layer ?Post Procedure Diagnosis ?Same as Pre-procedure ?Electronic Signature(s) ?Signed: 05/17/2021 4:52:29 PM By: Deon Pilling RN, BSN ?Entered By: Deon Pilling on 05/17/2021 14:45:19 ?-------------------------------------------------------------------------------- ?Encounter Discharge Information Details ?Patient Name: ?Date of Service: ?[name], [name] 05/17/2021 1:30 PM ?Medical Record Number: [id number] ?Patient Account Number: [id number] ?Date of Birth/Sex: ?Treating RN: ?Nov 25, 1925 (88 y.o. F) Deaton, Bobbi ?Primary Care Hildagarde Holleran: [name] ?Other Clinician: ?Referring Tyshaun Vinzant: ?Treating Devario Bucklew/Extender: Linton Ham ?[name], [name] ?Weeks in Treatment: 48 ?Encounter Discharge Information Items ?Discharge Condition: Stable ?Ambulatory Status: [name] ?Discharge Destination: Home ?Transportation: Private Auto ?Accompanied By: self ?Schedule Follow-up Appointment: Yes ?Clinical Summary of Care: ?Electronic  Signature(s) ?Signed: 05/17/2021 4:52:29 PM By: Deon Pilling RN, BSN ?Entered By: Deon Pilling on 05/17/2021 14:50:10 ?-------------------------------------------------------------------------------- ?Lower Extremity Assessment Details ?Patient Name: ?Date of Service: ?[name], [name] 05/17/2021 1:30 PM ?Medical Record Number: [id number] ?Patient Account Number: [id number] ?Date of Birth/Sex: ?Treating RN: ?1925/03/25 (88 y.o. F) Deaton, Bobbi ?Primary Care Pebbles Zeiders: [name] ?Other Clinician: ?Referring Cynthie Garmon: ?Treating Roshni Burbano/Extender: Linton Ham ?[name], [name] ?Weeks in Treatment: 48 ?Edema Assessment ?Assessed: [Left: Yes] [Right: Yes] ?Edema: [Left: Yes] [Right: Yes] ?Calf ?Left: Right: ?Point of Measurement: 30 cm From Medial Instep 33 cm 30 cm ?Ankle ?Left: Right: ?Point of Measurement: 9  cm From Medial Instep 23 cm 21 cm ?Electronic Signature(s) ?Signed: 05/17/2021 4:52:29 PM By: Deon Pilling RN, BSN ?Entered By: Deon Pilling on 05/17/2021 14:43:44 ?-------------------------------------------------------------------------------- ?Multi Wound Chart Details ?Patient Name: ?Date of Service: ?[name], [name] 05/17/2021 1:30 PM ?Medical Record Number: [id number] ?Patient Account Number: [id number] ?Date of Birth/Sex: ?Treating RN: ?1925/09/01 (88 y.o. F) Deaton, Bobbi ?Primary Care Jolisa Intriago: [name] ?Other Clinician: ?Referring Escarlet Saathoff: ?Treating Jaeda Bruso/Extender: Linton Ham ?[name], [name] ?Weeks in Treatment: 48 ?Vital Signs ?Height(in): 65 ?Pulse(bpm): 67 ?Weight(lbs): 163 ?Blood Pressure(mmHg): 117/76 ?Body Mass Index(BMI): 27.1 ?Temperature(??F): 97.9 ?Respiratory Rate(breaths/min): 20 ?Photos: [N/A:N/A] ?Right, Circumferential Lower Leg Left, Circumferential Lower Leg N/A ?Wound Location: ?Blister Blister N/A ?Wounding Event: ?Venous Leg Ulcer Malignant Wound N/A ?Primary Etiology: ?Anemia, Arrhythmia, Congestive Heart Anemia, Arrhythmia, Congestive Heart  N/A ?Comorbid History: ?Failure, Hypertension, Colitis, Failure, Hypertension, Colitis, ?Osteoarthritis Osteoarthritis ?02/06/2021 04/20/2020 N/A ?Date Acquired: ?67 48 N/A ?Weeks of Treatment: ?Open Open N/A ?Wound Status: ?No Yes N/A ?Wound Recurrence: ?Yes Yes N/A ?Clustered Wound: ?1 3 N/A ?Clustered Quantity: ?4x2x0.1 11.5x7x0.1 N/A ?Measurements L x W x D (cm) ?6.283 63.225 N/A ?A (cm?) : ?rea ?0.628 6.322 N/A ?Volume (cm?) : ?98.80% -1958.80% N/A ?% Reduction in Area: ?99.80% -1959.30% N/A ?% Reduction in Volume: ?Full Thickness Without Exposed Full Thickness Without Exposed N/A ?Classification: ?Support Structures Support Structures ?Medium Medium N/A ?Exudate Amount: ?Serosanguineous Serosanguineous N/A ?Exudate Type: ?red, brown red, brown N/A ?Exudate Color: ?Distinct, outline attached Distinct, outline attached N/A ?Wound Margin: ?Large (67-100%) Large (67-100%) N/A ?Granulation Amount: ?N/A Red, Pink N/A ?Granulation Quality: ?None Present (0%) None Present (0%) N/A ?Necrotic Amount: ?Fat Layer (Subcutaneous Tissue): Yes Fat Layer (Subcutaneous Tissue): Yes N/A ?Exposed Structures: ?Fascia: No ?Fascia: No ?Tendon: No ?Tendon: No ?Muscle: No ?Muscle: No ?Joint: No ?Joint: No ?Bone: No ?Bone: No ?Small (1-33%) Large (67-100%) N/A ?Epithelialization: ?Compression Therapy Compression Therapy N/A ?Procedures Performed: ?Treatment Notes ?Wound #19 (Lower Leg) Wound Laterality: Right, Circumferential ?Cleanser ?Soap and Water ?Discharge Instruction: May shower and wash wound with dial antibacterial soap and water prior to dressing change. ?Wound Cleanser ?Discharge Instruction: Cleanse the wound with wound cleanser prior to applying a clean dressing using gauze sponges, not tissue or cotton balls. ?Peri-Wound Care ?Sween Lotion (Moisturizing lotion) ?Discharge Instruction: Apply moisturizing lotion as directed ?Topical ?Primary Dressing ?KerraCel Ag Gelling Fiber Dressing, 4x5 in (silver alginate) ?Discharge  Instruction: Apply silver alginate to wound bed and any open areas. ?Secondary Dressing ?Woven Gauze Sponge, Non-Sterile 4x4 in ?Discharge Instruction: Apply over primary dressing as directed. ?Secured With ?Compression

## 2021-05-21 ENCOUNTER — Encounter (HOSPITAL_BASED_OUTPATIENT_CLINIC_OR_DEPARTMENT_OTHER): Payer: Medicare Other | Attending: Internal Medicine | Admitting: Internal Medicine

## 2021-05-21 DIAGNOSIS — E039 Hypothyroidism, unspecified: Secondary | ICD-10-CM | POA: Diagnosis not present

## 2021-05-21 DIAGNOSIS — I890 Lymphedema, not elsewhere classified: Secondary | ICD-10-CM | POA: Diagnosis not present

## 2021-05-21 DIAGNOSIS — I87313 Chronic venous hypertension (idiopathic) with ulcer of bilateral lower extremity: Secondary | ICD-10-CM | POA: Diagnosis not present

## 2021-05-21 DIAGNOSIS — I872 Venous insufficiency (chronic) (peripheral): Secondary | ICD-10-CM | POA: Insufficient documentation

## 2021-05-21 DIAGNOSIS — L97812 Non-pressure chronic ulcer of other part of right lower leg with fat layer exposed: Secondary | ICD-10-CM | POA: Diagnosis not present

## 2021-05-21 DIAGNOSIS — C4492 Squamous cell carcinoma of skin, unspecified: Secondary | ICD-10-CM | POA: Insufficient documentation

## 2021-05-21 DIAGNOSIS — Z853 Personal history of malignant neoplasm of breast: Secondary | ICD-10-CM | POA: Insufficient documentation

## 2021-05-21 DIAGNOSIS — L97822 Non-pressure chronic ulcer of other part of left lower leg with fat layer exposed: Secondary | ICD-10-CM | POA: Insufficient documentation

## 2021-05-22 ENCOUNTER — Telehealth: Payer: Self-pay | Admitting: Oncology

## 2021-05-22 NOTE — Telephone Encounter (Signed)
Called patient regarding upcoming May appointments, left a voicemail. 

## 2021-05-23 DIAGNOSIS — L281 Prurigo nodularis: Secondary | ICD-10-CM | POA: Diagnosis not present

## 2021-05-23 DIAGNOSIS — Z85828 Personal history of other malignant neoplasm of skin: Secondary | ICD-10-CM | POA: Diagnosis not present

## 2021-05-23 DIAGNOSIS — L821 Other seborrheic keratosis: Secondary | ICD-10-CM | POA: Diagnosis not present

## 2021-05-23 DIAGNOSIS — C44729 Squamous cell carcinoma of skin of left lower limb, including hip: Secondary | ICD-10-CM | POA: Diagnosis not present

## 2021-05-23 NOTE — Progress Notes (Signed)
Villari Cassundra A. ([id number]) ?, ?Visit Report for 05/21/2021 ?Chief Complaint Document Details ?Patient Name: Date of Service: ?[name], Khole A. 05/21/2021 2:15 PM ?Medical Record Number: [id number] ?Patient Account Number: [id number] ?Date of Birth/Sex: Treating RN: ?02/25/1925 (88 y.o. F) Deaton, Bobbi ?Primary Care Provider: [name] Other Clinician: ?Referring Provider: ?Treating Provider/Extender: Kalman Shan ?[name], [name] ?Weeks in Treatment: 49 ?Information Obtained from: Patient ?Chief Complaint ?Bilateral lower extremity wounds that have been biopsied and positive for squamous cell carcinoma ?Bilateral lower extremity wounds due to chronic venous insufficiency/lymphedema ?Electronic Signature(s) ?Signed: 05/21/2021 3:46:21 PM By: Kalman Shan DO ?Entered By: Kalman Shan on 05/21/2021 15:14:52 ?-------------------------------------------------------------------------------- ?HPI Details ?Patient Name: Date of Service: ?[name], [name] 05/21/2021 2:15 PM ?Medical Record Number: [id number] ?Patient Account Number: [id number] ?Date of Birth/Sex: Treating RN: ?[birth year] (88 y.o. F) Deaton, Bobbi ?Primary Care Provider: [name] Other Clinician: ?Referring Provider: ?Treating Provider/Extender: Kalman Shan ?[name], [name] ?Weeks in Treatment: 49 ?History of Present Illness ?Location: left leg ?HPI Description: Admission 5/23 ?Ms. 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 She states that the wound on ?the right has been present for a year. The 1 on the left has opened up 1 month ago. She is followed with oncology for this issue as she had biopsies that ?showed squamous cell carcinoma. She is also seeing radiation oncology for treatment  options. She presents today because she would like for her wounds to be ?healed by [place]. She currently denies signs of infection. ?6/1; patient presents for 1 week follow-up. She states she has tolerated the leg wraps well. She states these do not bother her and is happy to continue with ?them. She is scheduled to see her oncologist today to go over treatment options for the bilateral lower extremity squamous cell carcinoma. Radiation is ?currently not a recommended option. Patient states she overall feels well. ?6/22; patient presents for 3-week follow-up. She has tolerated the wraps well until her last wrap where she states they were uncomfortable. She attributes this to ?the home health nurse. She denies signs of infection. She has started her first treatment of antibody infusions for her Bilateral lower extremity squamous cell ?carcinoma. She has no complaints today. ?7/21; patient presents for 1 month follow-up. Unfortunately she has not had good experience with her wrap changes with home health. She would like to do her ?own dressing changes. She continues to do her antibody infusions. She denies signs of infection. ?7/28; patient presents for 1 week follow-up. At last clinic visit she was switched to daily dressing changes due to issues with the wrap and home health placing ?them. Unfortunately she has developed weeping to her legs bilaterally. She would like to be placed in wraps today. She would also like to follow with [place] weekly ?for wrap changes instead of having home health change them. She denies signs of infection. ?8/4; patient presents for 1 week follow-up. She has tolerated the Kerlix/Coban wraps well. She no longer has weeping to her legs. She took the wrap off 1 day ?before coming in to be able to take a shower. She has no issues or complaints today. She denies signs of infection. ?8/18; patient presents for follow-up. Patient has tolerated the wraps well. She brought her Velcro compression wraps  today. She has no  issues or complaints ?today. She had her chemotherapy infusion yesterday without issues. She denies signs of infection. ?8/25; patient presents for follow-up. She used her juxta lite compressions for the past week. It is unclear if she is able to put these on correctly since she ?states she has a hard time getting them to look right. She reports 2 open wounds. She currently denies signs of infection. ?9/1; patient presents for 1 week follow-up. She has 1 open wound. She tolerated the compression wraps well. She currently denies signs of infection. ?9/8; patient presents for 1 week follow-up. She has 2 open wounds 1 on each leg. She has tolerated the compression wrap well. She currently denies signs of ?infection. ?9/15; patient presents for 1 week follow-up. She now has 3 wounds. 2 on the left and 1 on the right. She continues to tolerate the compression wrap well. She ?currently denies signs of infection. She has obtained furosemide by her primary care physician and would like to discuss when to take this. ?9/22; patient presents for 1 week follow-up. She has scattered wounds on her lower extremities bilaterally. She did take furosemide twice in the past week. She ?does not recall having to urinate more frequently. She tolerated the 3 layer compression well. She denies signs of infection. ?10/13; patient has not been here recently because of [place]. Apparently the facility was only putting gauze on her legs. This is a patient I do not normally see. ?She has a history of squamous cell carcinoma bilaterally on her anterior lower legs followed for a period of time by Dr. Ronnald Ramp at [facility] dermatology. She ?is quite convincing that she did not have radiation to her lower legs. It is likely she also has significant chronic venous insufficiency stasis dermatitis. We ?have been using silver alginate under kerlix Coban. She has obvious open areas medially on the right and areas on the left. She also has  areas of extensive ?dry flaking adherent areas on the right and to a lesser extent on the left anterior. Nodular areas on the left lateral lower leg left posterior calf and right mid calf ?medially. ?10/21; patient presents for follow-up. She has no issues or complaints today. She has tolerated the 3 layer compression well bilaterally. She denies signs of ?infection. ?10/27; patient presents for follow-up. She continues to tolerate the 3 layer compression wrap well. She denies signs of infection. ?11/30; patient presents for follow-up. She has no issues or complaints today. ?11/10; patient arrived in clinic today for nurse visit accompanied by her daughter from [place]. The daughter had multiple questions so we turned this into ?doctors visit. Apparently after the last time I saw this woman in October she went to see Dr. Ronnald Ramp but he did not take the wraps off. She previously was ?treated with Cemiplimab for her squamous cell carcinoma. She did not receive radiation to her legs. I had wanted Dr. Ronnald Ramp to look at this because of the ?exceptionally damaged skin on her lower legs bilaterally. She has odd looking wounds on the left medial lower leg also extending posteriorly which we have not ?made a lot of progress on. But she also has a raised hyperkeratotic nodules on the right anterior tibial area plaques on the left medial lower thigh. These are not ?areas that are under compression. We have been using silver alginate on any open areas Liberal TCA under 3 layer compression. ?11/17; patient presents for follow-up. She had her wraps taken off yesterday for her dermatology appointment. She reports excessive  weeping to her legs ?bilaterally after the wraps were taken off. She currently denies signs of infection. ?12/12/2020 upon evaluation today patient appears to be doing about the same in regard to her wounds. She was actually started on Cipro after having biopsies ?apparently at her dermatology clinic she does not  have the results back from the actual biopsy but the culture did return and apparently they placed her on the ?Cipro she started that just this morning. Other than that her legs appear to be doing okay at this

## 2021-05-27 ENCOUNTER — Encounter (HOSPITAL_BASED_OUTPATIENT_CLINIC_OR_DEPARTMENT_OTHER): Payer: Medicare Other | Admitting: Internal Medicine

## 2021-05-27 DIAGNOSIS — L97822 Non-pressure chronic ulcer of other part of left lower leg with fat layer exposed: Secondary | ICD-10-CM | POA: Diagnosis not present

## 2021-05-27 DIAGNOSIS — L97812 Non-pressure chronic ulcer of other part of right lower leg with fat layer exposed: Secondary | ICD-10-CM | POA: Diagnosis not present

## 2021-05-27 DIAGNOSIS — I87313 Chronic venous hypertension (idiopathic) with ulcer of bilateral lower extremity: Secondary | ICD-10-CM | POA: Diagnosis not present

## 2021-05-27 DIAGNOSIS — I890 Lymphedema, not elsewhere classified: Secondary | ICD-10-CM

## 2021-05-27 DIAGNOSIS — C4492 Squamous cell carcinoma of skin, unspecified: Secondary | ICD-10-CM

## 2021-05-27 DIAGNOSIS — E039 Hypothyroidism, unspecified: Secondary | ICD-10-CM | POA: Diagnosis not present

## 2021-05-27 NOTE — Progress Notes (Signed)
Innes Essence A. ([id number]) ?, ?Visit Report for 05/27/2021 ?Chief Complaint Document Details ?Patient Name: Date of Service: ?[name], Baby A. 05/27/2021 1:30 PM ?Medical Record Number: [id number] ?Patient Account Number: [id number] ?Date of Birth/Sex: Treating RN: ?[birth year] (88 y.o. F) Deaton, Bobbi ?Primary Care Provider: [name] Other Clinician: ?Referring Provider: ?Treating Provider/Extender: Kalman Shan ?[name], [name] ?Weeks in Treatment: 50 ?Information Obtained from: Patient ?Chief Complaint ?Bilateral lower extremity wounds that have been biopsied and positive for squamous cell carcinoma ?Bilateral lower extremity wounds due to chronic venous insufficiency/lymphedema ?Electronic Signature(s) ?Signed: 05/27/2021 3:19:55 PM By: Kalman Shan DO ?Entered By: Kalman Shan on 05/27/2021 14:33:11 ?-------------------------------------------------------------------------------- ?HPI Details ?Patient Name: Date of Service: ?[name], [name] 05/27/2021 1:30 PM ?Medical Record Number: [id number] ?Patient Account Number: [id number] ?Date of Birth/Sex: Treating RN: ?[birth year] (88 y.o. F) Deaton, Bobbi ?Primary Care Provider: [name] Other Clinician: ?Referring Provider: ?Treating Provider/Extender: Kalman Shan ?[name], [name] ?Weeks in Treatment: 50 ?History of Present Illness ?Location: left leg ?HPI Description: Admission 5/23 ?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 She states that the wound on ?the right has been present for a year. The 1 on the left has opened up 1 month ago. She is followed with oncology for this issue as she had biopsies that ?showed squamous cell carcinoma. She is also seeing radiation oncology for treatment  options. She presents today because she would like for her wounds to be ?healed by [place]. She currently denies signs of infection. ?6/1; patient presents for 1 week follow-up. She states she has tolerated the leg wraps well. She states these do not bother her and is happy to continue with ?them. She is scheduled to see her oncologist today to go over treatment options for the bilateral lower extremity squamous cell carcinoma. Radiation is ?currently not a recommended option. Patient states she overall feels well. ?6/22; patient presents for 3-week follow-up. She has tolerated the wraps well until her last wrap where she states they were uncomfortable. She attributes this to ?the home health nurse. She denies signs of infection. She has started her first treatment of antibody infusions for her Bilateral lower extremity squamous cell ?carcinoma. She has no complaints today. ?7/21; patient presents for 1 month follow-up. Unfortunately she has not had good experience with her wrap changes with home health. She would like to do her ?own dressing changes. She continues to do her antibody infusions. She denies signs of infection. ?7/28; patient presents for 1 week follow-up. At last clinic visit she was switched to daily dressing changes due to issues with the wrap and home health placing ?them. Unfortunately she has developed weeping to her legs bilaterally. She would like to be placed in wraps today. She would also like to follow with [place] weekly ?for wrap changes instead of having home health change them. She denies signs of infection. ?8/4; patient presents for 1 week follow-up. She has tolerated the Kerlix/Coban wraps well. She no longer has weeping to her legs. She took the wrap off 1 day ?before coming in to be able to take a shower. She has no issues or complaints today. She denies signs of infection. ?8/18; patient presents for follow-up. Patient has tolerated the wraps well. She brought her Velcro compression wraps  today. She has no  issues or complaints ?today. She had her chemotherapy infusion yesterday without issues. She denies signs of infection. ?8/25; patient presents for follow-up. She used her juxta lite compressions for the past week. It is unclear if she is able to put these on correctly since she ?states she has a hard time getting them to look right. She reports 2 open wounds. She currently denies signs of infection. ?9/1; patient presents for 1 week follow-up. She has 1 open wound. She tolerated the compression wraps well. She currently denies signs of infection. ?9/8; patient presents for 1 week follow-up. She has 2 open wounds 1 on each leg. She has tolerated the compression wrap well. She currently denies signs of ?infection. ?9/15; patient presents for 1 week follow-up. She now has 3 wounds. 2 on the left and 1 on the right. She continues to tolerate the compression wrap well. She ?currently denies signs of infection. She has obtained furosemide by her primary care physician and would like to discuss when to take this. ?9/22; patient presents for 1 week follow-up. She has scattered wounds on her lower extremities bilaterally. She did take furosemide twice in the past week. She ?does not recall having to urinate more frequently. She tolerated the 3 layer compression well. She denies signs of infection. ?10/13; patient has not been here recently because of [place]. Apparently the facility was only putting gauze on her legs. This is a patient I do not normally see. ?She has a history of squamous cell carcinoma bilaterally on her anterior lower legs followed for a period of time by Dr. Ronnald Ramp at [facility] dermatology. She ?is quite convincing that she did not have radiation to her lower legs. It is likely she also has significant chronic venous insufficiency stasis dermatitis. We ?have been using silver alginate under kerlix Coban. She has obvious open areas medially on the right and areas on the left. She also has  areas of extensive ?dry flaking adherent areas on the right and to a lesser extent on the left anterior. Nodular areas on the left lateral lower leg left posterior calf and right mid calf ?medially. ?10/21; patient presents for follow-up. She has no issues or complaints today. She has tolerated the 3 layer compression well bilaterally. She denies signs of ?infection. ?10/27; patient presents for follow-up. She continues to tolerate the 3 layer compression wrap well. She denies signs of infection. ?11/30; patient presents for follow-up. She has no issues or complaints today. ?11/10; patient arrived in clinic today for nurse visit accompanied by her daughter from [place]. The daughter had multiple questions so we turned this into ?doctors visit. Apparently after the last time I saw this woman in October she went to see Dr. Ronnald Ramp but he did not take the wraps off. She previously was ?treated with Cemiplimab for her squamous cell carcinoma. She did not receive radiation to her legs. I had wanted Dr. Ronnald Ramp to look at this because of the ?exceptionally damaged skin on her lower legs bilaterally. She has odd looking wounds on the left medial lower leg also extending posteriorly which we have not ?made a lot of progress on. But she also has a raised hyperkeratotic nodules on the right anterior tibial area plaques on the left medial lower thigh. These are not ?areas that are under compression. We have been using silver alginate on any open areas Liberal TCA under 3 layer compression. ?11/17; patient presents for follow-up. She had her wraps taken off yesterday for her dermatology appointment. She reports excessive  weeping to her legs ?bilaterally after the wraps were taken off. She currently denies signs of infection. ?12/12/2020 upon evaluation today patient appears to be doing about the same in regard to her wounds. She was actually started on Cipro after having biopsies ?apparently at her dermatology clinic she does not  have the results back from the actual biopsy but the culture did return and apparently they placed her on the ?Cipro she started that just this morning. Other than that her legs appear to be doing okay at this

## 2021-05-27 NOTE — Progress Notes (Signed)
[name] Ingeborg A. ([id number]) ?, ?Visit Report for 05/27/2021 ?Arrival Information Details ?Patient Name: Date of Service: ?[name], [name] 05/27/2021 1:30 PM ?Medical Record Number: [id number] ?Patient Account Number: [id number] ?Date of Birth/Sex: Treating RN: ?[birth year] (88 y.o. F) Bell, [name] ?Primary Care Alexianna Nachreiner: [name] Other Clinician: ?Referring Roizy Harold: ?Treating Tela Kotecki/Extender: Kalman Shan ?[name], [name] ?Weeks in Treatment: 50 ?Visit Information History Since Last Visit ?Added or deleted any medications: No ?Patient Arrived: [name] ?Any new allergies or adverse reactions: No ?Arrival Time: 13:44 ?Had a fall or experienced change in No ?Accompanied By: self ?activities of daily living that may affect ?Transfer Assistance: None ?risk of falls: ?Patient Identification Verified: Yes ?Signs or symptoms of abuse/neglect since last visito No ?Secondary Verification Process Completed: Yes ?Hospitalized since last visit: No ?Patient Requires Transmission-Based No ?Implantable device outside of the clinic excluding No ?Precautions: ?cellular tissue based products placed in the center ?Patient Has Alerts: Yes ?since last visit: ?Patient Alerts: R ABI = Non compressible ?Has Dressing in Place as Prescribed: Yes ?L ABI = Non compressible ?Has Compression in Place as Prescribed: Yes ?Pain Present Now: No ?Electronic Signature(s) ?Signed: 05/27/2021 5:20:03 PM By: Deon Pilling RN, BSN ?Entered By: Deon Pilling on 05/27/2021 13:44:49 ?-------------------------------------------------------------------------------- ?Compression Therapy Details ?Patient Name: Date of Service: ?[name], [name] 05/27/2021 1:30 PM ?Medical Record Number: [id number] ?Patient Account Number: [id number] ?Date of Birth/Sex: Treating RN: ?1925-10-24 (88 y.o. F) Bell, [name] ?Primary Care Docie Abramovich: [name] Other Clinician: ?Referring Maiah Sinning: ?Treating Viola Placeres/Extender: Kalman Shan ?[name], [name] ?Weeks in Treatment: 50 ?Compression Therapy Performed for Wound Assessment: Wound #[id number] Left,Circumferential Lower Leg ?Performed By: Clinician Deon Pilling, RN ?Compression Type: Three Layer ?Post Procedure Diagnosis ?Same as Pre-procedure ?Electronic Signature(s) ?Signed: 05/27/2021 5:20:03 PM By: Deon Pilling RN, BSN ?Entered By: Deon Pilling on 05/27/2021 14:07:11 ?-------------------------------------------------------------------------------- ?Compression Therapy Details ?Patient Name: ?Date of Service: ?[name], [name] 05/27/2021 1:30 PM ?Medical Record Number: [id number] ?Patient Account Number: [id number] ?Date of Birth/Sex: ?Treating RN: ?March 06, 1925 (88 y.o. F) Bell, [name] ?Primary Care Greig Altergott: [name] ?Other Clinician: ?Referring Lillyana Majette: ?Treating Maebry Obrien/Extender: Kalman Shan ?[name], [name] ?Weeks in Treatment: 50 ?Compression Therapy Performed for Wound Assessment: NonWound Condition Lymphedema - Right Leg ?Performed By: Clinician Deon Pilling, RN ?Compression Type: Three Layer ?Post Procedure Diagnosis ?Same as Pre-procedure ?Electronic Signature(s) ?Signed: 05/27/2021 5:20:03 PM By: Deon Pilling RN, BSN ?Entered By: Deon Pilling on 05/27/2021 14:08:04 ?-------------------------------------------------------------------------------- ?Encounter Discharge Information Details ?Patient Name: ?Date of Service: ?[name], [name] 05/27/2021 1:30 PM ?Medical Record Number: [id number] ?Patient Account Number: [id number] ?Date of Birth/Sex: ?Treating RN: ?08/31/1925 (88 y.o. F) Bell, [name] ?Primary Care Pamelia Botto: [name] ?Other Clinician: ?Referring Issa Kosmicki: ?Treating Torion Hulgan/Extender: Kalman Shan ?[name], [name] ?Weeks in Treatment: 50 ?Encounter Discharge Information Items ?Discharge Condition: Stable ?Ambulatory Status: [name] ?Discharge Destination: Home ?Transportation: Private Auto ?Accompanied By: self ?Schedule Follow-up Appointment: Yes ?Clinical  Summary of Care: ?Electronic Signature(s) ?Signed: 05/27/2021 5:20:03 PM By: Deon Pilling RN, BSN ?Entered By: Deon Pilling on 05/27/2021 16:36:31 ?-------------------------------------------------------------------------------- ?Lower Extremity Assessment Details ?Patient Name: ?Date of Service: ?[name], [name] 05/27/2021 1:30 PM ?Medical Record Number: [id number] ?Patient Account Number: [id number] ?Date of Birth/Sex: ?Treating RN: ?July 03, 1925 (88 y.o. F) Bell, [name] ?Primary Care Jaizon Deroos: [name] ?Other Clinician: ?Referring Shavonta Gossen: ?Treating Corretta Munce/Extender: Kalman Shan ?[name], [name] ?Weeks in Treatment: 50 ?Edema Assessment ?Assessed: [Left: Yes] [Right: Yes] ?Edema: [Left: Yes] [Right: Yes] ?Calf ?Left: Right: ?Point of Measurement: 30 cm From Medial Instep 31  cm 38 cm ?Ankle ?Left: Right: ?Point of Measurement: 9 cm From Medial Instep 21 cm 24 cm ?Vascular Assessment ?Pulses: ?Dorsalis Pedis ?Palpable: [Left:Yes] [Right:Yes] ?Electronic Signature(s) ?Signed: 05/27/2021 5:20:03 PM By: Deon Pilling RN, BSN ?Entered By: Deon Pilling on 05/27/2021 13:49:47 ?-------------------------------------------------------------------------------- ?Multi Wound Chart Details ?Patient Name: ?Date of Service: ?[name], [name] 05/27/2021 1:30 PM ?Medical Record Number: [id number] ?Patient Account Number: [id number] ?Date of Birth/Sex: ?Treating RN: ?January 08, 1926 (88 y.o. F) Bell, [name] ?Primary Care Jantzen Pilger: [name] ?Other Clinician: ?Referring Miriya Cloer: ?Treating Paton Crum/Extender: Kalman Shan ?[name], [name] ?Weeks in Treatment: 50 ?Vital Signs ?Height(in): 65 ?Pulse(bpm): 77 ?Weight(lbs): 163 ?Blood Pressure(mmHg): 122/75 ?Body Mass Index(BMI): 27.1 ?Temperature(??F): 98.2 ?Respiratory Rate(breaths/min): 20 ?Photos: [N/A:N/A] ?Left, Circumferential Lower Leg N/A N/A ?Wound Location: ?Blister N/A N/A ?Wounding Event: ?Malignant Wound N/A N/A ?Primary Etiology: ?Anemia,  Arrhythmia, Congestive Heart N/A N/A ?Comorbid History: ?Failure, Hypertension, Colitis, ?Osteoarthritis ?04/20/2020 N/A N/A ?Date Acquired: ?63 N/A N/A ?Weeks of Treatment: ?Open N/A N/A ?Wound Status: ?Yes N/A N/A ?Wound Recurrence: ?Yes N/A N/A ?Clustered Wound: ?2 N/A N/A ?Clustered Quantity: ?6x1x0.1 N/A N/A ?Measurements L x W x D (cm) ?4.712 N/A N/A ?A (cm?) : ?rea ?0.471 N/A N/A ?Volume (cm?) : ?-53.40% N/A N/A ?% Reduction in Area: ?-53.40% N/A N/A ?% Reduction in Volume: ?Full Thickness Without Exposed N/A N/A ?Classification: ?Support Structures ?Medium N/A N/A ?Exudate Amount: ?Serosanguineous N/A N/A ?Exudate Type: ?red, brown N/A N/A ?Exudate Color: ?Distinct, outline attached N/A N/A ?Wound Margin: ?Large (67-100%) N/A N/A ?Granulation Amount: ?Red, Pink N/A N/A ?Granulation Quality: ?None Present (0%) N/A N/A ?Necrotic Amount: ?Fat Layer (Subcutaneous Tissue): Yes N/A N/A ?Exposed Structures: ?Fascia: No ?Tendon: No ?Muscle: No ?Joint: No ?Bone: No ?Large (67-100%) N/A N/A ?Epithelialization: ?Compression Therapy N/A N/A ?Procedures Performed: ?Treatment Notes ?Electronic Signature(s) ?Signed: 05/27/2021 3:19:55 PM By: Kalman Shan DO ?Signed: 05/27/2021 5:20:03 PM By: Deon Pilling RN, BSN ?Entered By: Kalman Shan on 05/27/2021 14:32:30 ?-------------------------------------------------------------------------------- ?Multi-Disciplinary Care Plan Details ?Patient Name: ?Date of Service: ?[name], Joniece A. 05/27/2021 1:30 PM ?Medical Record Number: [id number] ?Patient Account Number: [id number] ?Date of Birth/Sex: ?Treating RN: ?Oct 26, 1925 (88 y.o. F) Bell, [name] ?Primary Care Naika Noto: [name] ?Other Clinician: ?Referring Gracey Tolle: ?Treating Norie Latendresse/Extender: Kalman Shan ?[name], [name] ?Weeks in Treatment: 50 ?Multidisciplinary Care Plan reviewed with physician ?Active Inactive ?Venous Leg Ulcer ?Nursing Diagnoses: ?Knowledge deficit related to disease process and  management ?Potential for venous Insuffiency (use before diagnosis confirmed) ?Goals: ?Patient will maintain optimal edema control ?Date Initiated: 11/01/2020 ?Target Resolution Date: 07/18/2021 ?Goal Status: Active ?

## 2021-05-28 ENCOUNTER — Encounter: Payer: Self-pay | Admitting: Internal Medicine

## 2021-05-29 ENCOUNTER — Inpatient Hospital Stay: Payer: Medicare Other | Attending: Oncology | Admitting: Oncology

## 2021-05-29 ENCOUNTER — Other Ambulatory Visit: Payer: Self-pay

## 2021-05-29 ENCOUNTER — Inpatient Hospital Stay: Payer: Medicare Other

## 2021-05-29 VITALS — BP 118/60 | HR 81 | Temp 97.8°F | Resp 17 | Ht 67.0 in | Wt 147.6 lb

## 2021-05-29 DIAGNOSIS — Z79899 Other long term (current) drug therapy: Secondary | ICD-10-CM | POA: Diagnosis not present

## 2021-05-29 DIAGNOSIS — C4490 Unspecified malignant neoplasm of skin, unspecified: Secondary | ICD-10-CM

## 2021-05-29 DIAGNOSIS — C44721 Squamous cell carcinoma of skin of unspecified lower limb, including hip: Secondary | ICD-10-CM

## 2021-05-29 DIAGNOSIS — Z5112 Encounter for antineoplastic immunotherapy: Secondary | ICD-10-CM | POA: Diagnosis not present

## 2021-05-29 DIAGNOSIS — C7652 Malignant neoplasm of left lower limb: Secondary | ICD-10-CM | POA: Insufficient documentation

## 2021-05-29 DIAGNOSIS — E039 Hypothyroidism, unspecified: Secondary | ICD-10-CM

## 2021-05-29 LAB — CMP (CANCER CENTER ONLY)
ALT: 10 U/L (ref 0–44)
AST: 23 U/L (ref 15–41)
Albumin: 3.6 g/dL (ref 3.5–5.0)
Alkaline Phosphatase: 61 U/L (ref 38–126)
Anion gap: 7 (ref 5–15)
BUN: 20 mg/dL (ref 8–23)
CO2: 32 mmol/L (ref 22–32)
Calcium: 9.6 mg/dL (ref 8.9–10.3)
Chloride: 99 mmol/L (ref 98–111)
Creatinine: 1.36 mg/dL — ABNORMAL HIGH (ref 0.44–1.00)
GFR, Estimated: 36 mL/min — ABNORMAL LOW (ref >60)
Glucose, Bld: 106 mg/dL — ABNORMAL HIGH (ref 70–99)
Potassium: 4 mmol/L (ref 3.5–5.1)
Sodium: 138 mmol/L (ref 135–145)
Total Bilirubin: 0.7 mg/dL (ref 0.3–1.2)
Total Protein: 7.9 g/dL (ref 6.5–8.1)

## 2021-05-29 LAB — CBC WITH DIFFERENTIAL (CANCER CENTER ONLY)
Abs Immature Granulocytes: 0.02 10*3/uL (ref 0.00–0.07)
Basophils Absolute: 0.1 10*3/uL (ref 0.0–0.1)
Basophils Relative: 1 %
Eosinophils Absolute: 0.2 10*3/uL (ref 0.0–0.5)
Eosinophils Relative: 3 %
HCT: 36.1 % (ref 36.0–46.0)
Hemoglobin: 11.7 g/dL — ABNORMAL LOW (ref 12.0–15.0)
Immature Granulocytes: 0 %
Lymphocytes Relative: 16 %
Lymphs Abs: 1.2 10*3/uL (ref 0.7–4.0)
MCH: 34.2 pg — ABNORMAL HIGH (ref 26.0–34.0)
MCHC: 32.4 g/dL (ref 30.0–36.0)
MCV: 105.6 fL — ABNORMAL HIGH (ref 80.0–100.0)
Monocytes Absolute: 0.9 10*3/uL (ref 0.1–1.0)
Monocytes Relative: 13 %
Neutro Abs: 4.7 10*3/uL (ref 1.7–7.7)
Neutrophils Relative %: 67 %
Platelet Count: 326 10*3/uL (ref 150–400)
RBC: 3.42 MIL/uL — ABNORMAL LOW (ref 3.87–5.11)
RDW: 13.1 % (ref 11.5–15.5)
WBC Count: 7.1 10*3/uL (ref 4.0–10.5)
nRBC: 0 % (ref 0.0–0.2)

## 2021-05-29 LAB — TSH: TSH: 1.176 u[IU]/mL (ref 0.350–4.500)

## 2021-05-29 MED ORDER — SODIUM CHLORIDE 0.9 % IV SOLN
350.0000 mg | Freq: Once | INTRAVENOUS | Status: AC
  Administered 2021-05-29: 350 mg via INTRAVENOUS
  Filled 2021-05-29: qty 7

## 2021-05-29 MED ORDER — SODIUM CHLORIDE 0.9 % IV SOLN: Freq: Once | INTRAVENOUS | Status: AC

## 2021-05-29 NOTE — Patient Instructions (Signed)
[facility]   Discharge Instructions: Thank you for choosing [facility] to provide your oncology and hematology care.   If you have a lab appointment with the Cancer Center, please go directly to the Cancer Center and check in at the registration area.   Wear comfortable clothing and clothing appropriate for easy access to any Portacath or PICC line.   We strive to give you quality time with your provider. You may need to reschedule your appointment if you arrive late (15 or more minutes).  Arriving late affects you and other patients whose appointments are after yours.  Also, if you miss three or more appointments without notifying the office, you may be dismissed from the clinic at the provider's discretion.      For prescription refill requests, have your pharmacy contact our office and allow 72 hours for refills to be completed.    Today you received the following chemotherapy and/or immunotherapy agents: Cemiplimab (Libtayo)      To help prevent nausea and vomiting after your treatment, we encourage you to take your nausea medication as directed.  BELOW ARE SYMPTOMS THAT SHOULD BE REPORTED IMMEDIATELY: . *FEVER GREATER THAN 100.4 F (38 C) OR HIGHER . *CHILLS OR SWEATING . *NAUSEA AND VOMITING THAT IS NOT CONTROLLED WITH YOUR NAUSEA MEDICATION . *UNUSUAL SHORTNESS OF BREATH . *UNUSUAL BRUISING OR BLEEDING . *URINARY PROBLEMS (pain or burning when urinating, or frequent urination) . *BOWEL PROBLEMS (unusual diarrhea, constipation, pain near the anus) . TENDERNESS IN MOUTH AND THROAT WITH OR WITHOUT PRESENCE OF ULCERS (sore throat, sores in mouth, or a toothache) . UNUSUAL RASH, SWELLING OR PAIN  . UNUSUAL VAGINAL DISCHARGE OR ITCHING   Items with * indicate a potential emergency and should be followed up as soon as possible or go to the Emergency Department if any problems should occur.  Please show the CHEMOTHERAPY ALERT CARD or  IMMUNOTHERAPY ALERT CARD at check-in to the Emergency Department and triage nurse.  Should you have questions after your visit or need to cancel or reschedule your appointment, please contact [facility]  Dept: [phone]  and follow the prompts.  Office hours are 8:00 a.m. to 4:30 p.m. Monday - Friday. Please note that voicemails left after 4:00 p.m. may not be returned until the following business day.  We are closed weekends and major holidays. You have access to a nurse at all times for urgent questions. Please call the main number to the clinic Dept: [phone] and follow the prompts.   For any non-urgent questions, you may also contact your provider using MyChart. We now offer e-Visits for anyone 18 and older to request care online for non-urgent symptoms. For details visit [email or web address].   Also download the MyChart app! Go to the app store, search "MyChart", open the app, select [facility], and log in with your MyChart username and password.  Due to Covid, a mask is required upon entering the hospital/clinic. If you do not have a mask, one will be given to you upon arrival. For doctor visits, patients may have 1 support person aged 18 or older with them. For treatment visits, patients cannot have anyone with them due to current Covid guidelines and our immunocompromised population.   

## 2021-05-29 NOTE — Progress Notes (Signed)
Hematology and Oncology Follow Up Visit ? ?[name] ?[id number] ?1925-03-31 88 y.o. ?05/29/2021 12:05 PM ?[name], [name], MDPlotnikov, [name], MD  ? 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 ? ? ?Prior Therapy: ? ?She received Libtayo for 5 cycles concluded in September 2022. ? ?Current therapy: ? ?Libtayo 350 mg total dose restarted on March 26, 2021.  He is here for cycle 4 of therapy. ? ?Interim History: [name] returns today for a follow-up visit.  Since her last visit, she reports no major changes in her health.  She continues to tolerate current treatment without any issues.  She denies any nausea, vomiting or abdominal pain.  She denies any diarrhea.  Her appetite remained reasonable although she has lost 2 pounds.  Her performance status quality of life remains unchanged.  She denies any new skin rashes or lesions. ? ? ? ? ?Medications: Reviewed without changes. ?Current Outpatient Medications  ?Medication Sig Dispense Refill  ? acetaminophen (TYLENOL) 500 MG tablet Take 500 mg by mouth daily as needed for mild pain.     ? apixaban (ELIQUIS) 5 MG TABS tablet TAKE 1 TABLET BY MOUTH TWICE DAILY 60 tablet 5  ? B Complex-C (B-COMPLEX WITH VITAMIN C) tablet Take 1 tablet by mouth daily.    ? busPIRone (BUSPAR) 15 MG tablet TAKE 1 TABLET BY MOUTH TWICE DAILY 180 tablet 3  ? Cholecalciferol (VITAMIN D3) 1000 UNITS tablet Take 1,000 Units by mouth daily.    ? colestipol (COLESTID) 1 g tablet Take 1 tablet (1 g total) by mouth in the morning. 60 tablet 12  ? diltiazem (CARDIZEM CD) 120 MG 24 hr capsule TAKE 1 CAPSULE BY MOUTH ONCE DAILY 30 capsule 11  ? doxycycline (VIBRA-TABS) 100 MG tablet TAKE 1 TABLET BY MOUTH ONCE DAILY 30 tablet 11  ? famotidine (PEPCID) 20 MG tablet Take 20 mg by mouth daily.    ? furosemide (LASIX) 40 MG tablet Take 1 tablet (40 mg total) by mouth 2 (two) times daily. 60 tablet 5  ? gabapentin  (NEURONTIN) 100 MG capsule TAKE 1 CAPSULE BY MOUTH EVERY NIGHT AT BEDTIME *DO NOT CRUSH OR CHEW* **NOTE NEW INSTRUCTIONS* 30 capsule 5  ? hydrocortisone 2.5 % lotion Apply topically 3 (three) times daily. 240 mL 2  ? levothyroxine (SYNTHROID) 25 MCG tablet TAKE 1 TABLET BY MOUTH ONCE DAILY FOR THYROID *TAKE ON AN EMPTY STOMACH* 30 tablet 11  ? linaclotide (LINZESS) 290 MCG CAPS capsule TAKE 1 TABLET BY MOUTH ONCE DAILY *TAKE 30 MINUTES PRIOR TO FIRST MEAL* *TAKE ON AN EMPTY STOMACH* *DO NOT CRUSH OR CHEW* 30 capsule 5  ? loperamide (IMODIUM) 2 MG capsule Take 1 capsule (2 mg total) by mouth as needed for diarrhea or loose stools. Take 1 tab with first loose stool then one tab after each loose stool for maximum dose of 8 tabs daily. Pt to call the office if need for greater then 8 tabs a day 60 capsule 0  ? methylPREDNISolone (MEDROL DOSEPAK) 4 MG TBPK tablet As directed 21 tablet 0  ? metoprolol succinate (TOPROL-XL) 50 MG 24 hr tablet TAKE 1/2 TABLET = 25 MG BY MOUTH TWICE DAILY. TAKE WITH OR IMMEDIATELY FOLLOWING A MEAL 30 tablet 10  ? Multiple Vitamins-Minerals (MULTIVITAMIN WITH MINERALS) tablet Take 1 tablet by mouth daily.    ? polyethylene glycol (MIRALAX) 17 g packet Take 17 g by mouth daily. 30 each 0  ? potassium  chloride SA (KLOR-CON M) 20 MEQ tablet TAKE 1 TABLET BY MOUTH ONCE DAILY  ALONG W/ FUROSEMIDE AS NEED *TAKE WITH FOOD* *DO NOT CRUSH OR CHEW* *MAY DISSOLVE* 30 tablet 11  ? potassium chloride SA (KLOR-CON M) 20 MEQ tablet Take 1 tablet (20 mEq total) by mouth daily. Take along w/ Furosemide as need 30 tablet 3  ? prochlorperazine (COMPAZINE) 5 MG tablet Take 1 tablet (5 mg total) by mouth every 8 (eight) hours as needed for nausea or vomiting. 20 tablet 1  ? temazepam (RESTORIL) 15 MG capsule TAKE 1 CAPSULE BY MOUTH EVERY NIGHT AT BEDTIME AS NEEDED FOR SLEEP 30 capsule 3  ? ?No current facility-administered medications for this visit.  ? ? ? ?Allergies:  ?Allergies  ?Allergen Reactions  ? Zosyn  [Piperacillin Sod-Tazobactam So] Hives, Itching, Rash and Other (See Comments)  ?  Morbilliform eruptions with itching- looks like Measles  ? Atorvastatin   ?  myalgias  ? Atenolol Nausea And Vomiting  ? Clarithromycin Nausea And Vomiting  ? Codeine Sulfate Nausea Only  ? Levaquin [Levofloxacin] Nausea Only  ? Macrodantin Other (See Comments)  ?  UNSPECIFIED   ? Oxycodone-Acetaminophen Nausea And Vomiting  ? ? ? ? ?Physical Exam: ? ?Blood pressure 118/60, pulse 81, temperature 97.8 ?F (36.6 ?C), temperature source Temporal, resp. rate 17, height '5\' 7"'$  (1.702 m), weight 147 lb 9.6 oz (67 kg), SpO2 99 %. ? ? ?ECOG: 2 ? ? ? ?General appearance: Comfortable appearing without any discomfort ?Head: Normocephalic without any trauma ?Oropharynx: Mucous membranes are moist and pink without any thrush or ulcers. ?Eyes: Pupils are equal and round reactive to light. ?Lymph nodes: No cervical, supraclavicular, inguinal or axillary lymphadenopathy.   ?Heart:regular rate and rhythm.  S1 and S2 without leg edema. ?Lung: Clear without any rhonchi or wheezes.  No dullness to percussion. ?Abdomin: Soft, nontender, nondistended with good bowel sounds.  No hepatosplenomegaly. ?Musculoskeletal: No joint deformity or effusion.  Full range of motion noted. ?Neurological: No deficits noted on motor, sensory and deep tendon reflex exam. ?Skin: Skin lesions bilaterally noted on her lower extremities.  Upper extremity lesions appeared unchanged from previous examination predominantly on her right upper arm. ? ? ? ? ?Lab Results: ?Lab Results  ?Component Value Date  ? WBC 5.9 05/08/2021  ? HGB 11.2 (L) 05/08/2021  ? HCT 34.6 (L) 05/08/2021  ? MCV 106.5 (H) 05/08/2021  ? PLT 219 05/08/2021  ? ?  Chemistry   ?   ?Component Value Date/Time  ? NA 139 05/08/2021 0934  ? NA 134 08/11/2018 1425  ? NA 137 07/28/2016 1334  ? K 3.7 05/08/2021 0934  ? K 3.8 07/28/2016 1334  ? CL 103 05/08/2021 0934  ? CO2 29 05/08/2021 0934  ? CO2 28 07/28/2016 1334  ?  BUN 17 05/08/2021 0934  ? BUN 20 08/11/2018 1425  ? BUN 9.5 07/28/2016 1334  ? CREATININE 1.05 (H) 05/08/2021 0934  ? CREATININE 1.0 07/28/2016 1334  ?    ?Component Value Date/Time  ? CALCIUM 9.2 05/08/2021 0934  ? CALCIUM 9.6 07/28/2016 1334  ? ALKPHOS 53 05/08/2021 0934  ? ALKPHOS 96 07/28/2016 1334  ? AST 18 05/08/2021 0934  ? AST 21 07/28/2016 1334  ? ALT 11 05/08/2021 0934  ? ALT 24 07/28/2016 1334  ? BILITOT 1.0 05/08/2021 0934  ? BILITOT 1.21 (H) 07/28/2016 1334  ?  ? ? ? ? ? ?Impression and Plan: ? ?88 year old with: ? ?1.  Recurrent, locally advanced squamous cell  carcinoma of the lower extremity diagnosed in June 2022.  ? ?She remains on Libtayo without any major complications.  Risks and benefits of continuing this treatment were reviewed at this time.  Complications including autoimmune issues, GI toxicity and dermatological toxicities were reviewed.  At this time that she is agreeable to continue.  It is unclear how much benefit she is getting but that appears to have stabilized her upper and lower extremity lesions.  She is agreeable to continue at this time.  Laboratory data from today continues to show adequate hematological parameters. ? ? ? ? ?  ?2.  Breast cancer: She is currently in remission without any evidence of relapsed disease. ? ?3.  Nonhealing wounds in the lower extremities: No evidence of infection are noted and she continues to follow-up with the wound clinic. ? ?  ?  ?4.  Follow-up: She will return in 3 weeks for follow-up evaluation. ?  ?  ? 30 minutes were spent on this encounter.  Time was dedicated to reviewing laboratory data, disease status update and outlining future plan of care discussion. ? ? ?Zola Button, MD ?5/10/202312:05 PM ? ?

## 2021-05-29 NOTE — Progress Notes (Signed)
[name] Kraft, [name] at bedside to assess pt "bumps".  Per Dr. Alen Blew and [name], rash has been present for a while and no new orders given.  Pt to follow up with dermatologist as needed ?

## 2021-06-06 ENCOUNTER — Encounter (HOSPITAL_BASED_OUTPATIENT_CLINIC_OR_DEPARTMENT_OTHER): Payer: Medicare Other | Admitting: Internal Medicine

## 2021-06-06 DIAGNOSIS — L97822 Non-pressure chronic ulcer of other part of left lower leg with fat layer exposed: Secondary | ICD-10-CM

## 2021-06-06 DIAGNOSIS — C4492 Squamous cell carcinoma of skin, unspecified: Secondary | ICD-10-CM | POA: Diagnosis not present

## 2021-06-06 DIAGNOSIS — L97812 Non-pressure chronic ulcer of other part of right lower leg with fat layer exposed: Secondary | ICD-10-CM | POA: Diagnosis not present

## 2021-06-06 DIAGNOSIS — I87313 Chronic venous hypertension (idiopathic) with ulcer of bilateral lower extremity: Secondary | ICD-10-CM

## 2021-06-06 DIAGNOSIS — I890 Lymphedema, not elsewhere classified: Secondary | ICD-10-CM | POA: Diagnosis not present

## 2021-06-06 DIAGNOSIS — E039 Hypothyroidism, unspecified: Secondary | ICD-10-CM | POA: Diagnosis not present

## 2021-06-10 ENCOUNTER — Encounter (HOSPITAL_BASED_OUTPATIENT_CLINIC_OR_DEPARTMENT_OTHER): Payer: Medicare Other | Admitting: Internal Medicine

## 2021-06-10 DIAGNOSIS — L97822 Non-pressure chronic ulcer of other part of left lower leg with fat layer exposed: Secondary | ICD-10-CM | POA: Diagnosis not present

## 2021-06-10 DIAGNOSIS — L03115 Cellulitis of right lower limb: Secondary | ICD-10-CM

## 2021-06-10 DIAGNOSIS — C4492 Squamous cell carcinoma of skin, unspecified: Secondary | ICD-10-CM | POA: Diagnosis not present

## 2021-06-10 DIAGNOSIS — I87313 Chronic venous hypertension (idiopathic) with ulcer of bilateral lower extremity: Secondary | ICD-10-CM | POA: Diagnosis not present

## 2021-06-10 DIAGNOSIS — I890 Lymphedema, not elsewhere classified: Secondary | ICD-10-CM | POA: Diagnosis not present

## 2021-06-10 DIAGNOSIS — E039 Hypothyroidism, unspecified: Secondary | ICD-10-CM | POA: Diagnosis not present

## 2021-06-10 DIAGNOSIS — L97812 Non-pressure chronic ulcer of other part of right lower leg with fat layer exposed: Secondary | ICD-10-CM | POA: Diagnosis not present

## 2021-06-11 NOTE — Progress Notes (Signed)
[name] ([id number]) , Visit Report for 06/06/2021 Chief Complaint Document Details Patient Name: Date of Service: [name], [place]. 06/06/2021 1:30 PM Medical Record Number: [id number] Patient Account Number: [id number] Date of Birth/Sex: Treating RN: 03/30/1925 (88 y.o. [name] Primary Care Provider: [name] Other Clinician: Referring Provider: Treating Provider/Extender: [name] in Treatment: 25 Information Obtained from: Patient Chief Complaint Bilateral lower extremity wounds that have been biopsied and positive for squamous cell carcinoma Bilateral lower extremity wounds due to chronic venous insufficiency/lymphedema Electronic Signature(s) Signed: 06/06/2021 3:07:31 PM By: Kalman Shan DO Entered By: Kalman Shan on 06/06/2021 14:24:40 -------------------------------------------------------------------------------- HPI Details Patient Name: Date of Service: [name], [name]. 06/06/2021 1:30 PM Medical Record Number: [id number] Patient Account Number: [id number] Date of Birth/Sex: Treating RN: [birth year] (88 y.o. [name] Primary Care Provider: [name] Other Clinician: Referring Provider: Treating Provider/Extender: [name] in Treatment: 67 History of Present Illness Location: left leg HPI Description: Admission 5/23 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 She states that the wound on the right has been present for a year. The 1 on the left has opened up 1 month ago. She is followed with oncology for this issue as she had biopsies that showed squamous cell carcinoma. She is also seeing radiation oncology for treatment  options. She presents today because she would like for her wounds to be healed by [place]. She currently denies signs of infection. 6/1; patient presents for 1 week follow-up. She states she has tolerated the leg wraps well. She states these do not bother her and is happy to continue with them. She is scheduled to see her oncologist today to go over treatment options for the bilateral lower extremity squamous cell carcinoma. Radiation is currently not a recommended option. Patient states she overall feels well. 6/22; patient presents for 3-week follow-up. She has tolerated the wraps well until her last wrap where she states they were uncomfortable. She attributes this to the home health nurse. She denies signs of infection. She has started her first treatment of antibody infusions for her Bilateral lower extremity squamous cell carcinoma. She has no complaints today. 7/21; patient presents for 1 month follow-up. Unfortunately she has not had good experience with her wrap changes with home health. She would like to do her own dressing changes. She continues to do her antibody infusions. She denies signs of infection. 7/28; patient presents for 1 week follow-up. At last clinic visit she was switched to daily dressing changes due to issues with the wrap and home health placing them. Unfortunately she has developed weeping to her legs bilaterally. She would like to be placed in wraps today. She would also like to follow with [place] weekly for wrap changes instead of having home health change them. She denies signs of infection. 8/4; patient presents for 1 week follow-up. She has tolerated the Kerlix/Coban wraps well. She no longer has weeping to her legs. She took the wrap off 1 day before coming in to be able to take a shower. She has no issues or complaints today. She denies signs of infection. 8/18; patient presents for follow-up. Patient has tolerated the wraps well. She brought her Velcro compression wraps  today. She has no  issues or complaints today. She had her chemotherapy infusion yesterday without issues. She denies signs of infection. 8/25; patient presents for follow-up. She used her juxta lite compressions for the past week. It is unclear if she is able to put these on correctly since she states she has a hard time getting them to look right. She reports 2 open wounds. She currently denies signs of infection. 9/1; patient presents for 1 week follow-up. She has 1 open wound. She tolerated the compression wraps well. She currently denies signs of infection. 9/8; patient presents for 1 week follow-up. She has 2 open wounds 1 on each leg. She has tolerated the compression wrap well. She currently denies signs of infection. 9/15; patient presents for 1 week follow-up. She now has 3 wounds. 2 on the left and 1 on the right. She continues to tolerate the compression wrap well. She currently denies signs of infection. She has obtained furosemide by her primary care physician and would like to discuss when to take this. 9/22; patient presents for 1 week follow-up. She has scattered wounds on her lower extremities bilaterally. She did take furosemide twice in the past week. She does not recall having to urinate more frequently. She tolerated the 3 layer compression well. She denies signs of infection. 10/13; patient has not been here recently because of [place]. Apparently the facility was only putting gauze on her legs. This is a patient I do not normally see. 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 Ronnald Ramp at [facility] dermatology. She is quite convincing that she did not have radiation to her lower legs. It is likely she also has significant chronic venous insufficiency stasis dermatitis. We have been using silver alginate under kerlix Coban. She has obvious open areas medially on the right and areas on the left. She also has  areas of extensive dry flaking adherent areas on the right and to a lesser extent on the left anterior. Nodular areas on the left lateral lower leg left posterior calf and right mid calf medially. 10/21; patient presents for follow-up. She has no issues or complaints today. She has tolerated the 3 layer compression well bilaterally. She denies signs of infection. 10/27; patient presents for follow-up. She continues to tolerate the 3 layer compression wrap well. She denies signs of infection. 11/30; patient presents for follow-up. She has no issues or complaints today. 11/10; patient arrived in clinic today for nurse visit accompanied by her daughter from [place]. The daughter had multiple questions so we turned this into doctors visit. Apparently after the last time I saw this woman in October she went to see Dr. Ronnald Ramp but he did not take the wraps off. She previously was treated with Cemiplimab for her squamous cell carcinoma. She did not receive radiation to her legs. I had wanted Dr. Ronnald Ramp to look at this because of the exceptionally damaged skin on her lower legs bilaterally. She has odd looking wounds on the left medial lower leg also extending posteriorly which we have not made a lot of progress on. But she also has a raised hyperkeratotic nodules on the right anterior tibial area plaques on the left medial lower thigh. These are not areas that are under compression. We have been using silver alginate on any open areas Liberal TCA under 3 layer compression. 11/17; patient presents for follow-up. She had her wraps taken off yesterday for her dermatology appointment. She reports excessive  weeping to her legs bilaterally after the wraps were taken off. She currently denies signs of infection. 12/12/2020 upon evaluation today patient appears to be doing about the same in regard to her wounds. She was actually started on Cipro after having biopsies apparently at her dermatology clinic she does not  have the results back from the actual biopsy but the culture did return and apparently they placed her on the Cipro she started that just this morning. Other than that her legs appear to be doing okay at this time. 12/1; patient presents for follow-up. She has no issues or complaints today. She has started Lasix 40 mg daily to help with her bilateral leg swelling. 12/8; patient presents for follow-up. She has no issues or complaints today. 12/15; patient presents for 1 week follow-up. She has no issues or complaints today. 12/22; patient presents for follow-up. She has no issues or complaints today. 1/5; patient presents for follow-up. She has no issues or complaints today. She has tolerated the compression wraps well. 1/12; patient presents for follow-up. She is tolerated the compression wrap well and has no issues or complaints today. She denies signs of infection. 1/19; patient presents for follow-up. She took the compression wraps off 1 to 2 days ago to take a shower and did not have compression wraps replaced. She reports increased blistering throughout her legs bilaterally. She currently denies signs of infection. 1/26; patient presents for follow-up. She has no issues or complaints today. She tolerated the compression wraps well. She has not heard from oncology to schedule an appointment. 2/2; patient presents for follow-up. She has no issues or complaints today. She continues to tolerate the compression wrap well. 2/16; patient presents for follow-up. She has no issues or complaints today. 2/23; patient presents for follow-up. She has no issues or complaints today. Tomorrow she has an appointment with oncology to look at the suspicious lesion on her right lower extremity. She currently denies signs of infection. 3/2; patient presents for follow-up. She has been using her juxta light compression to the right lower extremity however there has been increased swelling and opening of wounds  throughout the legs with weeping. She currently denies signs of infection. She had no issues with the compression wrap to the left lower extremity. 3/16; patient presents for follow-up. She has no issues or complaints today. She states that she has been restarted on infusions for her squamous cell carcinoma of her legs. She has also been increased on Lasix to 40 mg twice daily. She has noticed a decrease in swelling to her legs. 3/23; patient presents for follow-up. She starts her second infusion next week for her squamous cell carcinoma lesions to her legs. She reports no issues with the compression wraps. 3/30; patient presents for follow-up. She has no issues or complaints today. 4/6; patient presents for follow-up. Her left lower extremity wounds have healed. She has her juxta light compression with her today. She has no issues or complaints today. 4/13; this is a patient we actually discharged to juxta lite stockings on the left leg the last time she was here. She came in on Monday for [place] to look at the leg and a nurse visit. She had massive swelling and numerous blisters and wound reopening. We put her back in 3 layer compression although I did not generate a note. Silver alginate on the wounded areas. We have also been doing the same on the right. In the meantime she is obtained her external compression pumps that were  previously ordered and she started to use them. She has skin cancers that are obvious on the right leg x2 her appointment with Dr. Jarome Matin of dermatology is on May 5.. She is also receiving weekly chemotherapy for recurrent presumably squamous cell carcinomas apparently has had 2 treatments 4/20; patient presents for follow-up. She has her new juxta lite compressions today. She continues to have open wounds to the left lower extremity. She has follow-up with dermatology in 2 weeks. She continues to have IV infusions for her squamous cell carcinoma lesions on her legs. 4/28;  the patient has bilateral lower extremity wounds predominantly on the right lateral upper leg, right anterior lower leg which in itself is probably a recurrent cancer as well as the left lateral lower leg. She has recurrent squamous cell carcinoma currently being treated with an IV infusion. She also has scattered nodules on her upper lower legs and thighs I am not certain of all of this is felt to be malignant as well We have been using silver alginate on the open areas wrapping her legs. She also has her external compression pumps at home but I am not clear that she is going to use these 5/2; patient presents for follow-up. She has not been taking her diuretics as prescribed. She sees Dr. Dian Situ, dermatology at the end of the week. She tolerated the compression wraps well today. She has her juxta lite compressions. 5/8; patient presents for follow-up. She saw Dr. Dian Situ, dermatology and she has nothing to report on. She has been using her juxta lite compression to the right lower extremity. She has tolerated the compression wrap well in the left lower extremity. She has not been taking her diuretic. She is scheduled to continue her infusions for her squamous cell carcinoma on her legs. 5/18; patient presents for follow-up. We wrapped her in 3 layer compression at last clinic visit. She has no issues or complaints today. Electronic Signature(s) Signed: 06/06/2021 3:07:31 PM By: Kalman Shan DO Entered By: Kalman Shan on 06/06/2021 14:45:07 -------------------------------------------------------------------------------- Physical Exam Details Patient Name: Date of Service: [name], [place]. 06/06/2021 1:30 PM Medical Record Number: [id number] Patient Account Number: [id number] Date of Birth/Sex: Treating RN: [birth year] (88 y.o. [name] Primary Care Provider: [name] Other Clinician: Referring Provider: Treating Provider/Extender: [name] in Treatment: 89 Constitutional respirations regular, non-labored and within target range for patient.. Cardiovascular 2+ dorsalis pedis/posterior tibialis pulses. Psychiatric pleasant and cooperative. Notes Right lower extremity: Scattered areas limited to skin breakdown. 2 areas suspicious for malignancy. 1 of these has been confirmed squamous cell carcinoma. Left lower extremity: Area of skin breakdown to the posterior aspect. Granulation tissue throughout. She also has another suspicious lesion for malignancy to the lateral aspect. No surrounding signs of infection. Electronic Signature(s) Signed: 06/06/2021 3:07:31 PM By: Kalman Shan DO Entered By: Kalman Shan on 06/06/2021 14:46:13 -------------------------------------------------------------------------------- Physician Orders Details Patient Name: Date of Service: [name], [place]. 06/06/2021 1:30 PM Medical Record Number: [id number] Patient Account Number: [id number] Date of Birth/Sex: Treating RN: 1926-01-14 (88 y.o. [name], [name] Primary Care Provider: [name] Other Clinician: Referring Provider: Treating Provider/Extender: [name] in Treatment: 8 Verbal / Phone Orders: No Diagnosis Coding ICD-10 Coding Code Description C44.92 Squamous cell carcinoma of skin, unspecified L97.812 Non-pressure chronic ulcer of other part of right lower leg with fat layer exposed L97.822 Non-pressure chronic ulcer of other part of left lower leg with fat layer  exposed I89.0 Lymphedema, not elsewhere classified I87.313 Chronic venous hypertension (idiopathic) with ulcer of bilateral lower extremity Follow-up Appointments ppointment in 1 week. - Dr. Heber [place] and [name], Room 8 06/10/2021 215pm Monday Return A Dr. Heber [place], Room 8 06/18/2021 130pm Tuesday Other: - ****T your fluid pill twice a day as directed on your medication bottle to aid in removing the  edema/swelling from your legs.**** ake Bathing/ Shower/ Hygiene May shower with protection but do not get wound dressing(s) wet. - Use cast protector Edema Control - Lymphedema / SCD / Other Lymphedema Pumps. Use Lymphedema pumps on leg(s) 2-3 times a day for 45-60 minutes. If wearing any wraps or hose, do not remove them. Continue exercising as instructed. - use one hour daily. Elevate legs to the level of the heart or above for 30 minutes daily and/or when sitting, a frequency of: - elevate the legs throughout the day heart level if possible. Avoid standing for long periods of time. Exercise regularly Additional Orders / Instructions Follow Nutritious Diet Non Wound Condition Right Lower Extremity Other Non Wound Condition Orders/Instructions: - lotion, calcium alginate Ag, abd pad, 3 layer compression to right leg. Wound Treatment Wound #7RR - Lower Leg Wound Laterality: Left, Circumferential Cleanser: Soap and Water 1 x Per Week/30 Days Discharge Instructions: May shower and wash wound with dial antibacterial soap and water prior to dressing change. Cleanser: Wound Cleanser 1 x Per Week/30 Days Discharge Instructions: Cleanse the wound with wound cleanser prior to applying a clean dressing using gauze sponges, not tissue or cotton balls. Peri-Wound Care: Sween Lotion (Moisturizing lotion) 1 x Per Week/30 Days Discharge Instructions: Apply moisturizing lotion as directed Prim Dressing: KerraCel Ag Gelling Fiber Dressing, 4x5 in (silver alginate) 1 x Per Week/30 Days ary Discharge Instructions: Apply silver alginate to wound bed as instructed Secondary Dressing: ABD Pad, 8x10 1 x Per Week/30 Days Discharge Instructions: Apply over primary dressing as directed. Secondary Dressing: Woven Gauze Sponge, Non-Sterile 4x4 in 1 x Per Week/30 Days Discharge Instructions: Apply over primary dressing as directed. Compression Wrap: ThreePress (3 layer compression wrap) 1 x Per Week/30  Days Discharge Instructions: Apply three layer compression as directed. Electronic Signature(s) Signed: 06/06/2021 3:07:31 PM By: Kalman Shan DO Entered By: Kalman Shan on 06/06/2021 14:46:22 -------------------------------------------------------------------------------- Problem List Details Patient Name: Date of Service: [name], [place]. 06/06/2021 1:30 PM Medical Record Number: [id number] Patient Account Number: [id number] Date of Birth/Sex: Treating RN: [birth year] (88 y.o. [name], [name] Primary Care Provider: [name] Other Clinician: Referring Provider: Treating Provider/Extender: [name] in Treatment: 13 Active Problems ICD-10 Encounter Code Description Active Date MDM Diagnosis C44.92 Squamous cell carcinoma of skin, unspecified 06/11/2020 No Yes L97.812 Non-pressure chronic ulcer of other part of right lower leg with fat layer 06/11/2020 No Yes exposed L97.822 Non-pressure chronic ulcer of other part of left lower leg with fat layer exposed5/23/2022 No Yes I89.0 Lymphedema, not elsewhere classified 01/31/2021 No Yes I87.313 Chronic venous hypertension (idiopathic) with ulcer of bilateral lower extremity 03/21/2021 No Yes Inactive Problems Resolved Problems Electronic Signature(s) Signed: 06/06/2021 3:07:31 PM By: Kalman Shan DO Entered By: Kalman Shan on 06/06/2021 14:24:26 -------------------------------------------------------------------------------- Progress Note Details Patient Name: Date of Service: [name], [place]. 06/06/2021 1:30 PM Medical Record Number: [id number] Patient Account Number: [id number] Date of Birth/Sex: Treating RN: May 28, 1925 (88 y.o. [name] Primary Care Provider: [name] Other Clinician: Referring Provider: Treating Provider/Extender: [name] in Treatment: 60 Subjective  Chief Complaint Information obtained from  Patient Bilateral lower extremity wounds that have been biopsied and positive for squamous cell carcinoma Bilateral lower extremity wounds due to chronic venous insufficiency/lymphedema History of Present Illness (HPI) The following HPI elements were documented for the patient's wound: Location: left leg Admission 5/23 Ms. 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 She states that the wound on the right has been present for a year. The 1 on the left has opened up 1 month ago. She is followed with oncology for this issue as she had biopsies that showed squamous cell carcinoma. She is also seeing radiation oncology for treatment options. She presents today because she would like for her wounds to be healed by [place]. She currently denies signs of infection. 6/1; patient presents for 1 week follow-up. She states she has tolerated the leg wraps well. She states these do not bother her and is happy to continue with them. She is scheduled to see her oncologist today to go over treatment options for the bilateral lower extremity squamous cell carcinoma. Radiation is currently not a recommended option. Patient states she overall feels well. 6/22; patient presents for 3-week follow-up. She has tolerated the wraps well until her last wrap where she states they were uncomfortable. She attributes this to the home health nurse. She denies signs of infection. She has started her first treatment of antibody infusions for her Bilateral lower extremity squamous cell carcinoma. She has no complaints today. 7/21; patient presents for 1 month follow-up. Unfortunately she has not had good experience with her wrap changes with home health. She would like to do her own dressing changes. She  continues to do her antibody infusions. She denies signs of infection. 7/28; patient presents for 1 week follow-up. At last clinic visit she was switched to daily dressing changes due to issues with the wrap and home health placing them. Unfortunately she has developed weeping to her legs bilaterally. She would like to be placed in wraps today. She would also like to follow with [place] weekly for wrap changes instead of having home health change them. She denies signs of infection. 8/4; patient presents for 1 week follow-up. She has tolerated the Kerlix/Coban wraps well. She no longer has weeping to her legs. She took the wrap off 1 day before coming in to be able to take a shower. She has no issues or complaints today. She denies signs of infection. 8/18; patient presents for follow-up. Patient has tolerated the wraps well. She brought her Velcro compression wraps today. She has no issues or complaints today. She had her chemotherapy infusion yesterday without issues. She denies signs of infection. 8/25; patient presents for follow-up. She used her juxta lite compressions for the past week. It is unclear if she is able to put these on correctly since she states she has a hard time getting them to look right. She reports 2 open wounds. She currently denies signs of infection. 9/1; patient presents for 1 week follow-up. She has 1 open wound. She tolerated the compression wraps well. She currently denies signs of infection. 9/8; patient presents for 1 week follow-up. She has 2 open wounds 1 on each leg. She has tolerated the compression wrap well. She currently denies signs of infection. 9/15; patient presents for 1 week follow-up.  She now has 3 wounds. 2 on the left and 1 on the right. She continues to tolerate the compression wrap well. She currently denies signs of infection. She has obtained furosemide by her primary care physician and would like to discuss when to take this. 9/22; patient presents for  1 week follow-up. She has scattered wounds on her lower extremities bilaterally. She did take furosemide twice in the past week. She does not recall having to urinate more frequently. She tolerated the 3 layer compression well. She denies signs of infection. 10/13; patient has not been here recently because of [place]. Apparently the facility was only putting gauze on her legs. This is a patient I do not normally see. 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 Ronnald Ramp at [facility] dermatology. She is quite convincing that she did not have radiation to her lower legs. It is likely she also has significant chronic venous insufficiency stasis dermatitis. We have been using silver alginate under kerlix Coban. She has obvious open areas medially on the right and areas on the left. She also has areas of extensive dry flaking adherent areas on the right and to a lesser extent on the left anterior. Nodular areas on the left lateral lower leg left posterior calf and right mid calf medially. 10/21; patient presents for follow-up. She has no issues or complaints today. She has tolerated the 3 layer compression well bilaterally. She denies signs of infection. 10/27; patient presents for follow-up. She continues to tolerate the 3 layer compression wrap well. She denies signs of infection. 11/30; patient presents for follow-up. She has no issues or complaints today. 11/10; patient arrived in clinic today for nurse visit accompanied by her daughter from [place]. The daughter had multiple questions so we turned this into doctors visit. Apparently after the last time I saw this woman in October she went to see Dr. Ronnald Ramp but he did not take the wraps off. She previously was treated with Cemiplimab for her squamous cell carcinoma. She did not receive radiation to her legs. I had wanted Dr. Ronnald Ramp to look at this because of the exceptionally damaged  skin on her lower legs bilaterally. She has odd looking wounds on the left medial lower leg also extending posteriorly which we have not made a lot of progress on. But she also has a raised hyperkeratotic nodules on the right anterior tibial area plaques on the left medial lower thigh. These are not areas that are under compression. We have been using silver alginate on any open areas Liberal TCA under 3 layer compression. 11/17; patient presents for follow-up. She had her wraps taken off yesterday for her dermatology appointment. She reports excessive weeping to her legs bilaterally after the wraps were taken off. She currently denies signs of infection. 12/12/2020 upon evaluation today patient appears to be doing about the same in regard to her wounds. She was actually started on Cipro after having biopsies apparently at her dermatology clinic she does not have the results back from the actual biopsy but the culture did return and apparently they placed her on the Cipro she started that just this morning. Other than that her legs appear to be doing okay at this time. 12/1; patient presents for follow-up. She has no issues or complaints today. She has started Lasix 40 mg daily to help with her bilateral leg swelling. 12/8; patient presents for follow-up. She has no issues or  complaints today. 12/15; patient presents for 1 week follow-up. She has no issues or complaints today. 12/22; patient presents for follow-up. She has no issues or complaints today. 1/5; patient presents for follow-up. She has no issues or complaints today. She has tolerated the compression wraps well. 1/12; patient presents for follow-up. She is tolerated the compression wrap well and has no issues or complaints today. She denies signs of infection. 1/19; patient presents for follow-up. She took the compression wraps off 1 to 2 days ago to take a shower and did not have compression wraps replaced. She reports increased blistering  throughout her legs bilaterally. She currently denies signs of infection. 1/26; patient presents for follow-up. She has no issues or complaints today. She tolerated the compression wraps well. She has not heard from oncology to schedule an appointment. 2/2; patient presents for follow-up. She has no issues or complaints today. She continues to tolerate the compression wrap well. 2/16; patient presents for follow-up. She has no issues or complaints today. 2/23; patient presents for follow-up. She has no issues or complaints today. Tomorrow she has an appointment with oncology to look at the suspicious lesion on her right lower extremity. She currently denies signs of infection. 3/2; patient presents for follow-up. She has been using her juxta light compression to the right lower extremity however there has been increased swelling and opening of wounds throughout the legs with weeping. She currently denies signs of infection. She had no issues with the compression wrap to the left lower extremity. 3/16; patient presents for follow-up. She has no issues or complaints today. She states that she has been restarted on infusions for her squamous cell carcinoma of her legs. She has also been increased on Lasix to 40 mg twice daily. She has noticed a decrease in swelling to her legs. 3/23; patient presents for follow-up. She starts her second infusion next week for her squamous cell carcinoma lesions to her legs. She reports no issues with the compression wraps. 3/30; patient presents for follow-up. She has no issues or complaints today. 4/6; patient presents for follow-up. Her left lower extremity wounds have healed. She has her juxta light compression with her today. She has no issues or complaints today. 4/13; this is a patient we actually discharged to juxta lite stockings on the left leg the last time she was here. She came in on Monday for [place] to look at the leg and a nurse visit. She had massive  swelling and numerous blisters and wound reopening. We put her back in 3 layer compression although I did not generate a note. Silver alginate on the wounded areas. We have also been doing the same on the right. In the meantime she is obtained her external compression pumps that were previously ordered and she started to use them. She has skin cancers that are obvious on the right leg x2 her appointment with Dr. Jarome Matin of dermatology is on May 5.. She is also receiving weekly chemotherapy for recurrent presumably squamous cell carcinomas apparently has had 2 treatments 4/20; patient presents for follow-up. She has her new juxta lite compressions today. She continues to have open wounds to the left lower extremity. She has follow-up with dermatology in 2 weeks. She continues to have IV infusions for her squamous cell carcinoma lesions on her legs. 4/28; the patient has bilateral lower extremity wounds predominantly on the right lateral upper leg, right anterior lower leg which in itself is probably a recurrent cancer as well as the  left lateral lower leg. She has recurrent squamous cell carcinoma currently being treated with an IV infusion. She also has scattered nodules on her upper lower legs and thighs I am not certain of all of this is felt to be malignant as well We have been using silver alginate on the open areas wrapping her legs. She also has her external compression pumps at home but I am not clear that she is going to use these 5/2; patient presents for follow-up. She has not been taking her diuretics as prescribed. She sees Dr. Dian Situ, dermatology at the end of the week. She tolerated the compression wraps well today. She has her juxta lite compressions. 5/8; patient presents for follow-up. She saw Dr. Dian Situ, dermatology and she has nothing to report on. She has been using her juxta lite compression to the right lower extremity. She has tolerated the compression wrap well in the left lower  extremity. She has not been taking her diuretic. She is scheduled to continue her infusions for her squamous cell carcinoma on her legs. 5/18; patient presents for follow-up. We wrapped her in 3 layer compression at last clinic visit. She has no issues or complaints today. Patient History Information obtained from Patient. Family History Unknown History. Social History Never smoker, Marital Status - Single, Alcohol Use - Never, Drug Use - No History, Caffeine Use - Never. Medical History Eyes Denies history of Cataracts, Optic Neuritis Ear/Nose/Mouth/Throat Denies history of Chronic sinus problems/congestion, Middle ear problems Hematologic/Lymphatic Patient has history of Anemia Denies history of Hemophilia, Human Immunodeficiency Virus, Lymphedema, Sickle Cell Disease Respiratory Denies history of Aspiration, Asthma, Chronic Obstructive Pulmonary Disease (COPD), Pneumothorax, Sleep Apnea, Tuberculosis Cardiovascular Patient has history of Arrhythmia - Atrial Flutter, A fibb, Congestive Heart Failure, Hypertension Denies history of Angina, Coronary Artery Disease, Deep Vein Thrombosis, Hypotension, Myocardial Infarction, Peripheral Arterial Disease, Peripheral Venous Disease, Phlebitis, Vasculitis Gastrointestinal Patient has history of Colitis Denies history of Cirrhosis , Crohnoos, Hepatitis A, Hepatitis B, Hepatitis C Endocrine Denies history of Type I Diabetes, Type II Diabetes Genitourinary Denies history of End Stage Renal Disease Immunological Denies history of Lupus Erythematosus, Raynaudoos, Scleroderma Integumentary (Skin) Denies history of History of Burn Musculoskeletal Patient has history of Osteoarthritis Denies history of Gout, Rheumatoid Arthritis, Osteomyelitis Neurologic Denies history of Dementia, Neuropathy, Quadriplegia, Paraplegia, Seizure Disorder Oncologic Denies history of Received Chemotherapy, Received Radiation Hospitalization/Surgery History -  removal of rod left leg. 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 Vitals Time Taken: 1:38 PM, Height: 65 in, Weight: 163 lbs, BMI: 27.1, Temperature: 98 F, Pulse: 79 bpm, Respiratory Rate: 18 breaths/min, Blood Pressure: 107/65 mmHg. Cardiovascular 2+ dorsalis pedis/posterior tibialis pulses. Psychiatric pleasant and cooperative. General Notes: Right lower extremity: Scattered areas limited to skin breakdown. 2 areas suspicious for malignancy. 1 of these has been confirmed squamous cell carcinoma. Left lower extremity: Area of skin breakdown to the posterior aspect. Granulation tissue throughout. She also has another suspicious lesion for malignancy to the lateral aspect. No surrounding signs of infection. Integumentary (Hair, Skin) Wound #7RR status is Open. Original cause of wound was Blister. The date acquired was: 04/20/2020. The wound has been in treatment 51 weeks. The wound is located on the Left,Circumferential Lower Leg. The wound measures 7.1cm length x 3.7cm width x 0.1cm depth; 20.632cm^2 area and 2.063cm^3 volume. There is Fat Layer (Subcutaneous Tissue) exposed. There is no tunneling or  undermining noted. There is a medium amount of serosanguineous drainage noted. The wound margin is distinct with the outline attached to the wound base. There is large (67-100%) red, pink granulation within the wound bed. There is no necrotic tissue within the wound bed. Assessment Active Problems ICD-10 Squamous cell carcinoma of skin, unspecified Non-pressure chronic ulcer of other part of right lower leg with fat layer exposed Non-pressure chronic ulcer of other part of left lower leg with fat layer exposed Lymphedema, not elsewhere classified Chronic venous  hypertension (idiopathic) with ulcer of bilateral lower extremity Patient's left lower extremity wound has shown signs of improvement. I recommended continuing silver alginate and 3 layer compression here. She continues to have scattered areas that are open to the right lower extremity. We will continue silver alginate and 3 layer compression here. She states she is taking her diuretics as prescribed. Procedures Wound #7RR Pre-procedure diagnosis of Wound #7RR is a Malignant Wound located on the Left,Circumferential Lower Leg . There was a Three Layer Compression Therapy Procedure by Rhae Hammock, RN. Post procedure Diagnosis Wound #7RR: Same as Pre-Procedure There was a Three Layer Compression Therapy Procedure by Rhae Hammock, RN. Post procedure Diagnosis Wound #: Same as Pre-Procedure Plan Follow-up Appointments: Return Appointment in 1 week. - Dr. Heber [place] and [place], Room 8 06/10/2021 215pm Monday Dr. Heber [place] and [place], Room 8 06/18/2021 130pm Tuesday Other: - ****T your fluid pill twice a day as directed on your medication bottle to aid in removing the edema/swelling from your legs.**** ake Bathing/ Shower/ Hygiene: May shower with protection but do not get wound dressing(s) wet. - Use cast protector Edema Control - Lymphedema / SCD / Other: Lymphedema Pumps. Use Lymphedema pumps on leg(s) 2-3 times a day for 45-60 minutes. If wearing any wraps or hose, do not remove them. Continue exercising as instructed. - use one hour daily. Elevate legs to the level of the heart or above for 30 minutes daily and/or when sitting, a frequency of: - elevate the legs throughout the day heart level if possible. Avoid standing for long periods of time. Exercise regularly Additional Orders / Instructions: Follow Nutritious Diet Non Wound Condition: Other Non Wound Condition Orders/Instructions: - lotion, calcium alginate Ag, abd pad, 3 layer compression to right leg. WOUND #7RR: - Lower Leg  Wound Laterality: Left, Circumferential Cleanser: Soap and Water 1 x Per Week/30 Days Discharge Instructions: May shower and wash wound with dial antibacterial soap and water prior to dressing change. Cleanser: Wound Cleanser 1 x Per Week/30 Days Discharge Instructions: Cleanse the wound with wound cleanser prior to applying a clean dressing using gauze sponges, not tissue or cotton balls. Peri-Wound Care: Sween Lotion (Moisturizing lotion) 1 x Per Week/30 Days Discharge Instructions: Apply moisturizing lotion as directed Prim Dressing: KerraCel Ag Gelling Fiber Dressing, 4x5 in (silver alginate) 1 x Per Week/30 Days ary Discharge Instructions: Apply silver alginate to wound bed as instructed Secondary Dressing: ABD Pad, 8x10 1 x Per Week/30 Days Discharge Instructions: Apply over primary dressing as directed. Secondary Dressing: Woven Gauze Sponge, Non-Sterile 4x4 in 1 x Per Week/30 Days Discharge Instructions: Apply over primary dressing as directed. Com pression Wrap: ThreePress (3 layer compression wrap) 1 x Per Week/30 Days Discharge Instructions: Apply three layer compression as directed. 1. Silver alginate under 3 layer compression bilaterally 2. Follow-up in 1 week Electronic Signature(s) Signed: 06/06/2021 3:07:31 PM By: Kalman Shan DO Entered By: Kalman Shan on 06/06/2021 14:51:12 -------------------------------------------------------------------------------- HxROS Details Patient Name: Date of Service: [name], Lyndon  A. 06/06/2021 1:30 PM Medical Record Number: [id number] Patient Account Number: [id number] Date of Birth/Sex: Treating RN: 04/29/1925 (88 y.o. [name] Primary Care Provider: [name] Other Clinician: Referring Provider: Treating Provider/Extender: [name] in Treatment: 69 Information Obtained From Patient Eyes Medical History: Negative for: Cataracts; Optic  Neuritis Ear/Nose/Mouth/Throat Medical History: Negative for: Chronic sinus problems/congestion; Middle ear problems Hematologic/Lymphatic Medical History: Positive for: Anemia Negative for: Hemophilia; Human Immunodeficiency Virus; Lymphedema; Sickle Cell Disease Respiratory Medical History: Negative for: Aspiration; Asthma; Chronic Obstructive Pulmonary Disease (COPD); Pneumothorax; Sleep Apnea; Tuberculosis Cardiovascular Medical History: Positive for: Arrhythmia - Atrial Flutter, A fibb; Congestive Heart Failure; Hypertension Negative for: Angina; Coronary Artery Disease; Deep Vein Thrombosis; Hypotension; Myocardial Infarction; Peripheral Arterial Disease; Peripheral Venous Disease; Phlebitis; Vasculitis Past Medical History Notes: hyperlipidemia Gastrointestinal Medical History: Positive for: Colitis Negative for: Cirrhosis ; Crohns; Hepatitis A; Hepatitis B; Hepatitis C Endocrine Medical History: Negative for: Type I Diabetes; Type II Diabetes Past Medical History Notes: hypothyroidism Genitourinary Medical History: Negative for: End Stage Renal Disease Immunological Medical History: Negative for: Lupus Erythematosus; Raynauds; Scleroderma Integumentary (Skin) Medical History: Negative for: History of Burn Musculoskeletal Medical History: Positive for: Osteoarthritis Negative for: Gout; Rheumatoid Arthritis; Osteomyelitis Neurologic Medical History: Negative for: Dementia; Neuropathy; Quadriplegia; Paraplegia; Seizure Disorder Past Medical History Notes: lumbar spindylolysis Oncologic Medical History: Negative for: Received Chemotherapy; Received Radiation Past Medical History Notes: BLE squamous ceel carcionoma Immunizations Pneumococcal Vaccine: Received Pneumococcal Vaccination: Yes Received Pneumococcal Vaccination On or After 60th Birthday: No Implantable Devices None Hospitalization / Surgery History Type of Hospitalization/Surgery removal of  rod left leg Family and Social History Unknown History: Yes; Never smoker; Marital Status - Single; Alcohol Use: Never; Drug Use: No History; Caffeine Use: Never; Financial Concerns: No; Food, Clothing or Shelter Needs: No; Support System Lacking: No; Transportation Concerns: No Electronic Signature(s) Signed: 06/06/2021 3:07:31 PM By: Kalman Shan DO Signed: 06/11/2021 4:34:51 PM By: Deon Pilling RN, BSN Entered By: Kalman Shan on 06/06/2021 14:45:14 -------------------------------------------------------------------------------- [place] Details Patient Name: Date of Service: [name], Brenn A. 06/06/2021 Medical Record Number: [id number] Patient Account Number: [id number] Date of Birth/Sex: Treating RN: 04-27-1925 (88 y.o. [name], [name] Primary Care Provider: [name] Other Clinician: Referring Provider: Treating Provider/Extender: [name] in Treatment: 41 Diagnosis Coding ICD-10 Codes Code Description C44.92 Squamous cell carcinoma of skin, unspecified L97.812 Non-pressure chronic ulcer of other part of right lower leg with fat layer exposed L97.822 Non-pressure chronic ulcer of other part of left lower leg with fat layer exposed I89.0 Lymphedema, not elsewhere classified I87.313 Chronic venous hypertension (idiopathic) with ulcer of bilateral lower extremity Facility Procedures CPT4: Code [id number] Description: 81 BILATERAL: Application of multi-layer venous compression system; leg (below knee), including ankle and t. Modifier: Quantity: 1 Physician Procedures : CPT4 Code Description Modifier [id number] - WC PHYS LEVEL 3 - EST PT ICD-10 Diagnosis Description C16.384 Non-pressure chronic ulcer of other part of right lower leg with fat layer exposed L97.822 Non-pressure chronic ulcer of other part of left  lower leg with fat layer exposed I87.313 Chronic venous hypertension (idiopathic) with ulcer of bilateral  lower extremity C44.92 Squamous cell carcinoma of skin, unspecified Quantity: 1 Electronic Signature(s) Signed: 06/11/2021 1:48:08 PM By: Deon Pilling RN, BSN Signed: 06/11/2021 2:32:41 PM By: Kalman Shan DO Previous Signature: 06/06/2021 3:07:31 PM Version By: Kalman Shan DO Entered By: Deon Pilling on 06/11/2021 13:48:07

## 2021-06-11 NOTE — Progress Notes (Signed)
[name] ([id number]) , Visit Report for 06/10/2021 Chief Complaint Document Details Patient Name: Date of Service: [name], [place]. 06/10/2021 2:15 PM Medical Record Number: [id number] Patient Account Number: [id number] Date of Birth/Sex: Treating RN: 07/25/1925 (88 y.o. [name] Primary Care Provider: [name] Other Clinician: Referring Provider: Treating Provider/Extender: [name] in Treatment: 67 Information Obtained from: Patient Chief Complaint Bilateral lower extremity wounds that have been biopsied and positive for squamous cell carcinoma Bilateral lower extremity wounds due to chronic venous insufficiency/lymphedema Electronic Signature(s) Signed: 06/10/2021 3:21:34 PM By: Kalman Shan DO Entered By: Kalman Shan on 06/10/2021 14:59:31 -------------------------------------------------------------------------------- HPI Details Patient Name: Date of Service: [name], [name] 06/10/2021 2:15 PM Medical Record Number: [id number] Patient Account Number: [id number] Date of Birth/Sex: Treating RN: 12-04-1925 (88 y.o. 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 She states that the wound on the right has been present for a year. The 1 on the left has opened up 1 month ago. She is followed with oncology for this issue as she had biopsies that showed squamous cell carcinoma. She is also seeing radiation oncology for treatment  options. She presents today because she would like for her wounds to be healed by [place]. She currently denies signs of infection. 6/1; patient presents for 1 week follow-up. She states she has tolerated the leg wraps well. She states these do not bother her and is happy to continue with them. She is scheduled to see her oncologist today to go over treatment options for the bilateral lower extremity squamous cell carcinoma. Radiation is currently not a recommended option. Patient states she overall feels well. 6/22; patient presents for 3-week follow-up. She has tolerated the wraps well until her last wrap where she states they were uncomfortable. She attributes this to the home health nurse. She denies signs of infection. She has started her first treatment of antibody infusions for her Bilateral lower extremity squamous cell carcinoma. She has no complaints today. 7/21; patient presents for 1 month follow-up. Unfortunately she has not had good experience with her wrap changes with home health. She would like to do her own dressing changes. She continues to do her antibody infusions. She denies signs of infection. 7/28; patient presents for 1 week follow-up. At last clinic visit she was switched to daily dressing changes due to issues with the wrap and home health placing them. Unfortunately she has developed weeping to her legs bilaterally. She would like to be placed in wraps today. She would also like to follow with [place] weekly for wrap changes instead of having home health change them. She denies signs of infection. 8/4; patient presents for 1 week follow-up. She has tolerated the Kerlix/Coban wraps well. She no longer has weeping to her legs. She took the wrap off 1 day before coming in to be able to take a shower. She has no issues or complaints today. She denies signs of infection. 8/18; patient presents for follow-up. Patient has tolerated the wraps well. She brought her Velcro compression wraps  today. She has no  issues or complaints today. She had her chemotherapy infusion yesterday without issues. She denies signs of infection. 8/25; patient presents for follow-up. She used her juxta lite compressions for the past week. It is unclear if she is able to put these on correctly since she states she has a hard time getting them to look right. She reports 2 open wounds. She currently denies signs of infection. 9/1; patient presents for 1 week follow-up. She has 1 open wound. She tolerated the compression wraps well. She currently denies signs of infection. 9/8; patient presents for 1 week follow-up. She has 2 open wounds 1 on each leg. She has tolerated the compression wrap well. She currently denies signs of infection. 9/15; patient presents for 1 week follow-up. She now has 3 wounds. 2 on the left and 1 on the right. She continues to tolerate the compression wrap well. She currently denies signs of infection. She has obtained furosemide by her primary care physician and would like to discuss when to take this. 9/22; patient presents for 1 week follow-up. She has scattered wounds on her lower extremities bilaterally. She did take furosemide twice in the past week. She does not recall having to urinate more frequently. She tolerated the 3 layer compression well. She denies signs of infection. 10/13; patient has not been here recently because of [place]. Apparently the facility was only putting gauze on her legs. This is a patient I do not normally see. 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 Ronnald Ramp at [facility] dermatology. She is quite convincing that she did not have radiation to her lower legs. It is likely she also has significant chronic venous insufficiency stasis dermatitis. We have been using silver alginate under kerlix Coban. She has obvious open areas medially on the right and areas on the left. She also has  areas of extensive dry flaking adherent areas on the right and to a lesser extent on the left anterior. Nodular areas on the left lateral lower leg left posterior calf and right mid calf medially. 10/21; patient presents for follow-up. She has no issues or complaints today. She has tolerated the 3 layer compression well bilaterally. She denies signs of infection. 10/27; patient presents for follow-up. She continues to tolerate the 3 layer compression wrap well. She denies signs of infection. 11/30; patient presents for follow-up. She has no issues or complaints today. 11/10; patient arrived in clinic today for nurse visit accompanied by her daughter from [place]. The daughter had multiple questions so we turned this into doctors visit. Apparently after the last time I saw this woman in October she went to see Dr. Ronnald Ramp but he did not take the wraps off. She previously was treated with Cemiplimab for her squamous cell carcinoma. She did not receive radiation to her legs. I had wanted Dr. Ronnald Ramp to look at this because of the exceptionally damaged skin on her lower legs bilaterally. She has odd looking wounds on the left medial lower leg also extending posteriorly which we have not made a lot of progress on. But she also has a raised hyperkeratotic nodules on the right anterior tibial area plaques on the left medial lower thigh. These are not areas that are under compression. We have been using silver alginate on any open areas Liberal TCA under 3 layer compression. 11/17; patient presents for follow-up. She had her wraps taken off yesterday for her dermatology appointment. She reports excessive  weeping to her legs bilaterally after the wraps were taken off. She currently denies signs of infection. 12/12/2020 upon evaluation today patient appears to be doing about the same in regard to her wounds. She was actually started on Cipro after having biopsies apparently at her dermatology clinic she does not  have the results back from the actual biopsy but the culture did return and apparently they placed her on the Cipro she started that just this morning. Other than that her legs appear to be doing okay at this time. 12/1; patient presents for follow-up. She has no issues or complaints today. She has started Lasix 40 mg daily to help with her bilateral leg swelling. 12/8; patient presents for follow-up. She has no issues or complaints today. 12/15; patient presents for 1 week follow-up. She has no issues or complaints today. 12/22; patient presents for follow-up. She has no issues or complaints today. 1/5; patient presents for follow-up. She has no issues or complaints today. She has tolerated the compression wraps well. 1/12; patient presents for follow-up. She is tolerated the compression wrap well and has no issues or complaints today. She denies signs of infection. 1/19; patient presents for follow-up. She took the compression wraps off 1 to 2 days ago to take a shower and did not have compression wraps replaced. She reports increased blistering throughout her legs bilaterally. She currently denies signs of infection. 1/26; patient presents for follow-up. She has no issues or complaints today. She tolerated the compression wraps well. She has not heard from oncology to schedule an appointment. 2/2; patient presents for follow-up. She has no issues or complaints today. She continues to tolerate the compression wrap well. 2/16; patient presents for follow-up. She has no issues or complaints today. 2/23; patient presents for follow-up. She has no issues or complaints today. Tomorrow she has an appointment with oncology to look at the suspicious lesion on her right lower extremity. She currently denies signs of infection. 3/2; patient presents for follow-up. She has been using her juxta light compression to the right lower extremity however there has been increased swelling and opening of wounds  throughout the legs with weeping. She currently denies signs of infection. She had no issues with the compression wrap to the left lower extremity. 3/16; patient presents for follow-up. She has no issues or complaints today. She states that she has been restarted on infusions for her squamous cell carcinoma of her legs. She has also been increased on Lasix to 40 mg twice daily. She has noticed a decrease in swelling to her legs. 3/23; patient presents for follow-up. She starts her second infusion next week for her squamous cell carcinoma lesions to her legs. She reports no issues with the compression wraps. 3/30; patient presents for follow-up. She has no issues or complaints today. 4/6; patient presents for follow-up. Her left lower extremity wounds have healed. She has her juxta light compression with her today. She has no issues or complaints today. 4/13; this is a patient we actually discharged to juxta lite stockings on the left leg the last time she was here. She came in on Monday for [place] to look at the leg and a nurse visit. She had massive swelling and numerous blisters and wound reopening. We put her back in 3 layer compression although I did not generate a note. Silver alginate on the wounded areas. We have also been doing the same on the right. In the meantime she is obtained her external compression pumps that were  previously ordered and she started to use them. She has skin cancers that are obvious on the right leg x2 her appointment with Dr. Jarome Matin of dermatology is on May 5.. She is also receiving weekly chemotherapy for recurrent presumably squamous cell carcinomas apparently has had 2 treatments 4/20; patient presents for follow-up. She has her new juxta lite compressions today. She continues to have open wounds to the left lower extremity. She has follow-up with dermatology in 2 weeks. She continues to have IV infusions for her squamous cell carcinoma lesions on her legs. 4/28;  the patient has bilateral lower extremity wounds predominantly on the right lateral upper leg, right anterior lower leg which in itself is probably a recurrent cancer as well as the left lateral lower leg. She has recurrent squamous cell carcinoma currently being treated with an IV infusion. She also has scattered nodules on her upper lower legs and thighs I am not certain of all of this is felt to be malignant as well We have been using silver alginate on the open areas wrapping her legs. She also has her external compression pumps at home but I am not clear that she is going to use these 5/2; patient presents for follow-up. She has not been taking her diuretics as prescribed. She sees Dr. Dian Situ, dermatology at the end of the week. She tolerated the compression wraps well today. She has her juxta lite compressions. 5/8; patient presents for follow-up. She saw Dr. Dian Situ, dermatology and she has nothing to report on. She has been using her juxta lite compression to the right lower extremity. She has tolerated the compression wrap well in the left lower extremity. She has not been taking her diuretic. She is scheduled to continue her infusions for her squamous cell carcinoma on her legs. 5/18; patient presents for follow-up. We wrapped her in 3 layer compression at last clinic visit. She has no issues or complaints today. 5/22; patient presents for follow-up. She has been wrapped in 3 layer compression bilaterally to her lower extremities over the past week. She has been scratching at the top of the wrap and picking at her skin. This area has increased warmth and erythema. Electronic Signature(s) Signed: 06/10/2021 3:21:34 PM By: Kalman Shan DO Entered By: Kalman Shan on 06/10/2021 15:03:21 -------------------------------------------------------------------------------- Physical Exam Details Patient Name: Date of Service: [name], [place]. 06/10/2021 2:15 PM Medical Record Number:  [id number] Patient Account Number: [id number] Date of Birth/Sex: Treating RN: 1925/06/16 (88 y.o. [name] Primary Care Provider: [name] Other Clinician: Referring Provider: Treating Provider/Extender: [name] in Treatment: 75 Constitutional respirations regular, non-labored and within target range for patient.. Cardiovascular 2+ dorsalis pedis/posterior tibialis pulses. Psychiatric pleasant and cooperative. Notes Right lower extremity: Scattered areas limited to skin breakdown. 2 areas suspicious for malignancy. 1 of these has been confirmed squamous cell carcinoma. T the area below the knee there is increased warmth and erythema. Evidence of scratching noted. Left lower extremity: Area of skin breakdown to the o posterior aspect. Granulation tissue throughout. She also has another suspicious lesion for malignancy to the lateral aspect. No surrounding signs of infection. Electronic Signature(s) Signed: 06/10/2021 3:21:34 PM By: Kalman Shan DO Entered By: Kalman Shan on 06/10/2021 15:04:34 -------------------------------------------------------------------------------- Physician Orders Details Patient Name: Date of Service: [name], [name] 06/10/2021 2:15 PM Medical Record Number: [id number] Patient Account Number: [id number] Date of Birth/Sex: Treating RN: June 26, 1925 (88 y.o. [name] Primary Care Provider: [name] Other  Clinician: Referring Provider: Treating Provider/Extender: [name] in Treatment: 49 Verbal / Phone Orders: No Diagnosis Coding ICD-10 Coding Code Description C44.92 Squamous cell carcinoma of skin, unspecified L97.812 Non-pressure chronic ulcer of other part of right lower leg with fat layer exposed L97.822 Non-pressure chronic ulcer of other part of left lower leg with fat layer exposed I89.0 Lymphedema, not elsewhere  classified I87.313 Chronic venous hypertension (idiopathic) with ulcer of bilateral lower extremity Follow-up Appointments ppointment in 1 week. - Dr. Heber Canal [place] [facility] [place], Room 8 06/18/2021 130pm Tuesday Return A Other: - ****T your fluid pill twice a day as directed on your medication bottle to aid in removing the edema/swelling from your legs.**** ake Bathing/ Shower/ Hygiene May shower with protection but do not get wound dressing(s) wet. - Use cast protector Edema Control - Lymphedema / SCD / Other Lymphedema Pumps. Use Lymphedema pumps on leg(s) 2-3 times a day for 45-60 minutes. If wearing any wraps or hose, do not remove them. Continue exercising as instructed. - use one hour daily. Elevate legs to the level of the heart or above for 30 minutes daily and/or when sitting, a frequency of: - elevate the legs throughout the day heart level if possible. Avoid standing for long periods of time. Exercise regularly Additional Orders / Instructions Follow Nutritious Diet Non Wound Condition Right Lower Extremity Other Non Wound Condition Orders/Instructions: - lotion, calcium alginate Ag, abd pad, 3 layer compression to right leg. Wound Treatment Wound #[id number] - Lower Leg Wound Laterality: Left, Circumferential Cleanser: Soap and Water 1 x Per Week/30 Days Discharge Instructions: May shower and wash wound with dial antibacterial soap and water prior to dressing change. Cleanser: Wound Cleanser 1 x Per Week/30 Days Discharge Instructions: Cleanse the wound with wound cleanser prior to applying a clean dressing using gauze sponges, not tissue or cotton balls. Peri-Wound Care: Sween Lotion (Moisturizing lotion) 1 x Per Week/30 Days Discharge Instructions: Apply moisturizing lotion as directed Prim Dressing: KerraCel Ag Gelling Fiber Dressing, 4x5 in (silver alginate) 1 x Per Week/30 Days ary Discharge Instructions: Apply silver alginate to wound bed as instructed Secondary Dressing: ABD Pad,  8x10 1 x Per Week/30 Days Discharge Instructions: Apply over primary dressing as directed. Secondary Dressing: Woven Gauze Sponge, Non-Sterile 4x4 in 1 x Per Week/30 Days Discharge Instructions: Apply over primary dressing as directed. Compression Wrap: ThreePress (3 layer compression wrap) 1 x Per Week/30 Days Discharge Instructions: Apply three layer compression as directed. Patient Medications llergies: Zosyn, atorvastatin, atenolol, clarithromycin, codeine, Levaquin, Macrodantin, oxycodone A Notifications Medication Indication Start End 06/10/2021 cephalexin DOSE 1 - oral 500 mg tablet - 1 tablet oral every 6 hrs x 7 days Electronic Signature(s) Signed: 06/10/2021 3:21:34 PM By: Kalman Shan DO Signed: 06/10/2021 5:26:25 PM By: Levan Hurst RN, BSN Entered By: Levan Hurst on 06/10/2021 15:14:47 Prescription 06/10/2021 Lex Santa Lighter -------------------------------------------------------------------------------- Kalman Shan DO Patient Name: Provider: January 12, 1926 [id number] Date of Birth: NPI#Wanda Plump [id number] Sex: DEA #: [phone] Phone #: License #: [place] Patient Address: [place], [place] [address] [place], [place] [address] [phone] Allergies Zosyn; atorvastatin; atenolol; clarithromycin; codeine; Levaquin; Macrodantin; oxycodone Medication Medication: Route: Strength: Form: cephalexin oral 500 mg tablet Class: CEPHALOSPORIN ANTIBIOTICS - 1ST GENERATION Dose: Frequency / Time: Indication: 1 1 tablet oral every 6 hrs x 7 days Number of Refills: Number of Units: 0 Generic Substitution: Start Date: End Date: Administered  at Facility: Substitution Permitted 6/43/3295 No Note to Pharmacy: Hand Signature: Date(s): Electronic Signature(s) Signed: 06/10/2021 3:21:34 PM By: Kalman Shan DO Signed: 06/10/2021 5:26:25 PM By: Levan Hurst  RN, BSN Entered By: Levan Hurst on 06/10/2021 15:14:48 -------------------------------------------------------------------------------- Problem List Details Patient Name: Date of Service: [name], [name] 06/10/2021 2:15 PM Medical Record Number: [id number] Patient Account Number: [id number] Date of Birth/Sex: Treating RN: September 16, 1925 (88 y.o. [name] Primary Care Provider: [name] Other Clinician: Referring Provider: Treating Provider/Extender: [name] in Treatment: 31 Active Problems ICD-10 Encounter Code Description Active Date MDM Diagnosis C44.92 Squamous cell carcinoma of skin, unspecified 06/11/2020 No Yes L97.812 Non-pressure chronic ulcer of other part of right lower leg with fat layer 06/11/2020 No Yes exposed L97.822 Non-pressure chronic ulcer of other part of left lower leg with fat layer exposed5/23/2022 No Yes I89.0 Lymphedema, not elsewhere classified 01/31/2021 No Yes I87.313 Chronic venous hypertension (idiopathic) with ulcer of bilateral lower extremity 03/21/2021 No Yes Inactive Problems Resolved Problems Electronic Signature(s) Signed: 06/10/2021 3:21:34 PM By: Kalman Shan DO Entered By: Kalman Shan on 06/10/2021 14:55:00 -------------------------------------------------------------------------------- Progress Note Details Patient Name: Date of Service: [name], [name] 06/10/2021 2:15 PM Medical Record Number: [id number] Patient Account Number: [id number] Date of Birth/Sex: Treating RN: 1925-03-17 (88 y.o. [name], [name] Primary Care Provider: [name] Other Clinician: Referring Provider: Treating Provider/Extender: [name] in Treatment: 62 Subjective Chief Complaint Information obtained from Patient Bilateral lower extremity wounds that have been biopsied and positive for squamous cell carcinoma Bilateral lower extremity wounds due to  chronic venous insufficiency/lymphedema History of Present Illness (HPI) The following HPI elements were documented for the patient's wound: Location: left leg Admission 5/23 [name] is a 88 year old female with a past medical history of 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 She states that the wound on the right has been present for a year. The 1 on the left has opened up 1 month ago. She is followed with oncology for this issue as she had biopsies that showed squamous cell carcinoma. She is also seeing radiation oncology for treatment options. She presents today because she would like for her wounds to be healed by [place]. She currently denies signs of infection. 6/1; patient presents for 1 week follow-up. She states she has tolerated the leg wraps well. She states these do not bother her and is happy to continue with them. She is scheduled to see her oncologist today to go over treatment options for the bilateral lower extremity squamous cell carcinoma. Radiation is currently not a recommended option. Patient states she overall feels well. 6/22; patient presents for 3-week follow-up. She has tolerated the wraps well until her last wrap where she states they were uncomfortable. She attributes this to the home health nurse. She denies signs of infection. She has started her first treatment of antibody infusions for her Bilateral lower extremity squamous cell carcinoma. She has no complaints today. 7/21; patient presents for 1 month follow-up. Unfortunately she has not had good experience with her wrap changes with home health. She would like to do her own dressing changes. She continues to do her antibody infusions. She denies signs of infection. 7/28; patient presents for 1 week follow-up. At last clinic visit she was  switched to daily dressing changes due to issues  with the wrap and home health placing them. Unfortunately she has developed weeping to her legs bilaterally. She would like to be placed in wraps today. She would also like to follow with [place] weekly for wrap changes instead of having home health change them. She denies signs of infection. 8/4; patient presents for 1 week follow-up. She has tolerated the Kerlix/Coban wraps well. She no longer has weeping to her legs. She took the wrap off 1 day before coming in to be able to take a shower. She has no issues or complaints today. She denies signs of infection. 8/18; patient presents for follow-up. Patient has tolerated the wraps well. She brought her Velcro compression wraps today. She has no issues or complaints today. She had her chemotherapy infusion yesterday without issues. She denies signs of infection. 8/25; patient presents for follow-up. She used her juxta lite compressions for the past week. It is unclear if she is able to put these on correctly since she states she has a hard time getting them to look right. She reports 2 open wounds. She currently denies signs of infection. 9/1; patient presents for 1 week follow-up. She has 1 open wound. She tolerated the compression wraps well. She currently denies signs of infection. 9/8; patient presents for 1 week follow-up. She has 2 open wounds 1 on each leg. She has tolerated the compression wrap well. She currently denies signs of infection. 9/15; patient presents for 1 week follow-up. She now has 3 wounds. 2 on the left and 1 on the right. She continues to tolerate the compression wrap well. She currently denies signs of infection. She has obtained furosemide by her primary care physician and would like to discuss when to take this. 9/22; patient presents for 1 week follow-up. She has scattered wounds on her lower extremities bilaterally. She did take furosemide twice in the past week. She does not  recall having to urinate more frequently. She tolerated the 3 layer compression well. She denies signs of infection. 10/13; patient has not been here recently because of [place]. Apparently the facility was only putting gauze on her legs. This is a patient I do not normally see. 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 Ronnald Ramp at [facility] dermatology. She is quite convincing that she did not have radiation to her lower legs. It is likely she also has significant chronic venous insufficiency stasis dermatitis. We have been using silver alginate under kerlix Coban. She has obvious open areas medially on the right and areas on the left. She also has areas of extensive dry flaking adherent areas on the right and to a lesser extent on the left anterior. Nodular areas on the left lateral lower leg left posterior calf and right mid calf medially. 10/21; patient presents for follow-up. She has no issues or complaints today. She has tolerated the 3 layer compression well bilaterally. She denies signs of infection. 10/27; patient presents for follow-up. She continues to tolerate the 3 layer compression wrap well. She denies signs of infection. 11/30; patient presents for follow-up. She has no issues or complaints today. 11/10; patient arrived in clinic today for nurse visit accompanied by her daughter from [place]. The daughter had multiple questions so we turned this into doctors visit. Apparently after the last time I saw this woman in October she went to see Dr. Ronnald Ramp but he did not take the wraps off. She previously was treated with  Cemiplimab for her squamous cell carcinoma. She did not receive radiation to her legs. I had wanted Dr. Ronnald Ramp to look at this because of the exceptionally damaged skin on her lower legs bilaterally. She has odd looking wounds on the left medial lower leg also extending posteriorly which we have not made  a lot of progress on. But she also has a raised hyperkeratotic nodules on the right anterior tibial area plaques on the left medial lower thigh. These are not areas that are under compression. We have been using silver alginate on any open areas Liberal TCA under 3 layer compression. 11/17; patient presents for follow-up. She had her wraps taken off yesterday for her dermatology appointment. She reports excessive weeping to her legs bilaterally after the wraps were taken off. She currently denies signs of infection. 12/12/2020 upon evaluation today patient appears to be doing about the same in regard to her wounds. She was actually started on Cipro after having biopsies apparently at her dermatology clinic she does not have the results back from the actual biopsy but the culture did return and apparently they placed her on the Cipro she started that just this morning. Other than that her legs appear to be doing okay at this time. 12/1; patient presents for follow-up. She has no issues or complaints today. She has started Lasix 40 mg daily to help with her bilateral leg swelling. 12/8; patient presents for follow-up. She has no issues or complaints today. 12/15; patient presents for 1 week follow-up. She has no issues or complaints today. 12/22; patient presents for follow-up. She has no issues or complaints today. 1/5; patient presents for follow-up. She has no issues or complaints today. She has tolerated the compression wraps well. 1/12; patient presents for follow-up. She is tolerated the compression wrap well and has no issues or complaints today. She denies signs of infection. 1/19; patient presents for follow-up. She took the compression wraps off 1 to 2 days ago to take a shower and did not have compression wraps replaced. She reports increased blistering throughout her legs bilaterally. She currently denies signs of infection. 1/26; patient presents for follow-up. She has no issues or  complaints today. She tolerated the compression wraps well. She has not heard from oncology to schedule an appointment. 2/2; patient presents for follow-up. She has no issues or complaints today. She continues to tolerate the compression wrap well. 2/16; patient presents for follow-up. She has no issues or complaints today. 2/23; patient presents for follow-up. She has no issues or complaints today. Tomorrow she has an appointment with oncology to look at the suspicious lesion on her right lower extremity. She currently denies signs of infection. 3/2; patient presents for follow-up. She has been using her juxta light compression to the right lower extremity however there has been increased swelling and opening of wounds throughout the legs with weeping. She currently denies signs of infection. She had no issues with the compression wrap to the left lower extremity. 3/16; patient presents for follow-up. She has no issues or complaints today. She states that she has been restarted on infusions for her squamous cell carcinoma of her legs. She has also been increased on Lasix to 40 mg twice daily. She has noticed a decrease in swelling to her legs. 3/23; patient presents for follow-up. She starts her second infusion next week for her squamous cell carcinoma lesions to her legs. She reports no issues with the compression wraps. 3/30; patient presents for follow-up. She has no issues  or complaints today. 4/6; patient presents for follow-up. Her left lower extremity wounds have healed. She has her juxta light compression with her today. She has no issues or complaints today. 4/13; this is a patient we actually discharged to juxta lite stockings on the left leg the last time she was here. She came in on Monday for [place] to look at the leg and a nurse visit. She had massive swelling and numerous blisters and wound reopening. We put her back in 3 layer compression although I did not generate a note. Silver  alginate on the wounded areas. We have also been doing the same on the right. In the meantime she is obtained her external compression pumps that were previously ordered and she started to use them. She has skin cancers that are obvious on the right leg x2 her appointment with Dr. Jarome Matin of dermatology is on May 5.. She is also receiving weekly chemotherapy for recurrent presumably squamous cell carcinomas apparently has had 2 treatments 4/20; patient presents for follow-up. She has her new juxta lite compressions today. She continues to have open wounds to the left lower extremity. She has follow-up with dermatology in 2 weeks. She continues to have IV infusions for her squamous cell carcinoma lesions on her legs. 4/28; the patient has bilateral lower extremity wounds predominantly on the right lateral upper leg, right anterior lower leg which in itself is probably a recurrent cancer as well as the left lateral lower leg. She has recurrent squamous cell carcinoma currently being treated with an IV infusion. She also has scattered nodules on her upper lower legs and thighs I am not certain of all of this is felt to be malignant as well We have been using silver alginate on the open areas wrapping her legs. She also has her external compression pumps at home but I am not clear that she is going to use these 5/2; patient presents for follow-up. She has not been taking her diuretics as prescribed. She sees Dr. Dian Situ, dermatology at the end of the week. She tolerated the compression wraps well today. She has her juxta lite compressions. 5/8; patient presents for follow-up. She saw Dr. Dian Situ, dermatology and she has nothing to report on. She has been using her juxta lite compression to the right lower extremity. She has tolerated the compression wrap well in the left lower extremity. She has not been taking her diuretic. She is scheduled to continue her infusions for her squamous cell carcinoma on her  legs. 5/18; patient presents for follow-up. We wrapped her in 3 layer compression at last clinic visit. She has no issues or complaints today. 5/22; patient presents for follow-up. She has been wrapped in 3 layer compression bilaterally to her lower extremities over the past week. She has been scratching at the top of the wrap and picking at her skin. This area has increased warmth and erythema. Patient History Information obtained from Patient. Family History Unknown History. Social History Never smoker, Marital Status - Single, Alcohol Use - Never, Drug Use - No History, Caffeine Use - Never. Medical History Eyes Denies history of Cataracts, Optic Neuritis Ear/Nose/Mouth/Throat Denies history of Chronic sinus problems/congestion, Middle ear problems Hematologic/Lymphatic Patient has history of Anemia Denies history of Hemophilia, Human Immunodeficiency Virus, Lymphedema, Sickle Cell Disease Respiratory Denies history of Aspiration, Asthma, Chronic Obstructive Pulmonary Disease (COPD), Pneumothorax, Sleep Apnea, Tuberculosis Cardiovascular Patient has history of Arrhythmia - Atrial Flutter, A fibb, Congestive Heart Failure, Hypertension Denies history of Angina, Coronary  Artery Disease, Deep Vein Thrombosis, Hypotension, Myocardial Infarction, Peripheral Arterial Disease, Peripheral Venous Disease, Phlebitis, Vasculitis Gastrointestinal Patient has history of Colitis Denies history of Cirrhosis , Crohnoos, Hepatitis A, Hepatitis B, Hepatitis C Endocrine Denies history of Type I Diabetes, Type II Diabetes Genitourinary Denies history of End Stage Renal Disease Immunological Denies history of Lupus Erythematosus, Raynaudoos, Scleroderma Integumentary (Skin) Denies history of History of Burn Musculoskeletal Patient has history of Osteoarthritis Denies history of Gout, Rheumatoid Arthritis, Osteomyelitis Neurologic Denies history of Dementia, Neuropathy, Quadriplegia,  Paraplegia, Seizure Disorder Oncologic Denies history of Received Chemotherapy, Received Radiation Hospitalization/Surgery History - removal of rod left leg. 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 Vitals Time Taken: 1:58 PM, Height: 65 in, Weight: 163 lbs, BMI: 27.1, Temperature: 97 F, Pulse: 75 bpm, Respiratory Rate: 18 breaths/min, Blood Pressure: 117/71 mmHg. Cardiovascular 2+ dorsalis pedis/posterior tibialis pulses. Psychiatric pleasant and cooperative. General Notes: Right lower extremity: Scattered areas limited to skin breakdown. 2 areas suspicious for malignancy. 1 of these has been confirmed squamous cell carcinoma. T the area below the knee there is increased warmth and erythema. Evidence of scratching noted. Left lower extremity: Area of skin o breakdown to the posterior aspect. Granulation tissue throughout. She also has another suspicious lesion for malignancy to the lateral aspect. No surrounding signs of infection. Integumentary (Hair, Skin) Wound #7RR status is Open. Original cause of wound was Blister. The date acquired was: 04/20/2020. The wound has been in treatment 52 weeks. The wound is located on the Left,Circumferential Lower Leg. The wound measures 3cm length x 2.5cm width x 0.1cm depth; 5.89cm^2 area and 0.589cm^3 volume. There is Fat Layer (Subcutaneous Tissue) exposed. There is no tunneling or undermining noted. There is a medium amount of serosanguineous drainage noted. The wound margin is distinct with the outline attached to the wound base. There is large (67-100%) red, pink granulation within the wound bed. There is no necrotic tissue within the wound bed. Assessment Active Problems ICD-10 Squamous cell carcinoma of skin,  unspecified Non-pressure chronic ulcer of other part of right lower leg with fat layer exposed Non-pressure chronic ulcer of other part of left lower leg with fat layer exposed Lymphedema, not elsewhere classified Chronic venous hypertension (idiopathic) with ulcer of bilateral lower extremity Patient has 1 remaining wound to the posterior aspect of the left lower extremity. This appears well-healing. I recommended continuing with silver alginate here and 3 layer compression. She has some scattered small areas limited to skin breakdown to the right lower extremity and would benefit from continued compression therapy here. She has been scratching at the top of the wrap on the right lower extremity and has signs of cellulitis. I will go ahead and treat this with Keflex. She reports no allergies to Keflex. Follow-up in 1 week. Procedures Wound #7RR Pre-procedure diagnosis of Wound #7RR is a Malignant Wound located on the Left,Circumferential Lower Leg . There was a Three Layer Compression Therapy Procedure by Levan Hurst, RN. Post procedure Diagnosis Wound #7RR: Same as Pre-Procedure There was a Three Layer Compression Therapy Procedure by Levan Hurst, RN. Post procedure Diagnosis Wound #: Same as Pre-Procedure Plan Follow-up Appointments: Return Appointment in 1 week. - Dr. Heber [place] [facility] [place], Room 8 06/18/2021 130pm Tuesday Other: - ****T your fluid pill twice a day as directed on your medication bottle to aid in removing the edema/swelling from your legs.**** Jimmye Norman  Bathing/ Shower/ Hygiene: May shower with protection but do not get wound dressing(s) wet. - Use cast protector Edema Control - Lymphedema / SCD / Other: Lymphedema Pumps. Use Lymphedema pumps on leg(s) 2-3 times a day for 45-60 minutes. If wearing any wraps or hose, do not remove them. Continue exercising as instructed. - use one hour daily. Elevate legs to the level of the heart or above for 30 minutes daily and/or when  sitting, a frequency of: - elevate the legs throughout the day heart level if possible. Avoid standing for long periods of time. Exercise regularly Additional Orders / Instructions: Follow Nutritious Diet Non Wound Condition: Other Non Wound Condition Orders/Instructions: - lotion, calcium alginate Ag, abd pad, 3 layer compression to right leg. The following medication(s) was prescribed: cephalexin oral 500 mg tablet 1 1 tablet oral every 6 hrs x 7 days starting 06/10/2021 WOUND #7RR: - Lower Leg Wound Laterality: Left, Circumferential Cleanser: Soap and Water 1 x Per Week/30 Days Discharge Instructions: May shower and wash wound with dial antibacterial soap and water prior to dressing change. Cleanser: Wound Cleanser 1 x Per Week/30 Days Discharge Instructions: Cleanse the wound with wound cleanser prior to applying a clean dressing using gauze sponges, not tissue or cotton balls. Peri-Wound Care: Sween Lotion (Moisturizing lotion) 1 x Per Week/30 Days Discharge Instructions: Apply moisturizing lotion as directed Prim Dressing: KerraCel Ag Gelling Fiber Dressing, 4x5 in (silver alginate) 1 x Per Week/30 Days ary Discharge Instructions: Apply silver alginate to wound bed as instructed Secondary Dressing: ABD Pad, 8x10 1 x Per Week/30 Days Discharge Instructions: Apply over primary dressing as directed. Secondary Dressing: Woven Gauze Sponge, Non-Sterile 4x4 in 1 x Per Week/30 Days Discharge Instructions: Apply over primary dressing as directed. Compression Wrap: ThreePress (3 layer compression wrap) 1 x Per Week/30 Days Discharge Instructions: Apply three layer compression as directed. 1. Silver alginate under 3 layer compression bilaterally to lower extremities 2. Keflex Electronic Signature(s) Signed: 06/10/2021 3:21:34 PM By: Kalman Shan DO Entered By: Kalman Shan on 06/10/2021 15:15:56 -------------------------------------------------------------------------------- HxROS  Details Patient Name: Date of Service: [name], Aizah A. 06/10/2021 2:15 PM Medical Record Number: [id number] Patient Account Number: [id number] Date of Birth/Sex: Treating RN: 1925-12-28 (88 y.o. [name] Primary Care Provider: [name] Other Clinician: Referring Provider: Treating Provider/Extender: [name] in Treatment: 53 Information Obtained From Patient Eyes Medical History: Negative for: Cataracts; Optic Neuritis Ear/Nose/Mouth/Throat Medical History: Negative for: Chronic sinus problems/congestion; Middle ear problems Hematologic/Lymphatic Medical History: Positive for: Anemia Negative for: Hemophilia; Human Immunodeficiency Virus; Lymphedema; Sickle Cell Disease Respiratory Medical History: Negative for: Aspiration; Asthma; Chronic Obstructive Pulmonary Disease (COPD); Pneumothorax; Sleep Apnea; Tuberculosis Cardiovascular Medical History: Positive for: Arrhythmia - Atrial Flutter, A fibb; Congestive Heart Failure; Hypertension Negative for: Angina; Coronary Artery Disease; Deep Vein Thrombosis; Hypotension; Myocardial Infarction; Peripheral Arterial Disease; Peripheral Venous Disease; Phlebitis; Vasculitis Past Medical History Notes: hyperlipidemia Gastrointestinal Medical History: Positive for: Colitis Negative for: Cirrhosis ; Crohns; Hepatitis A; Hepatitis B; Hepatitis C Endocrine Medical History: Negative for: Type I Diabetes; Type II Diabetes Past Medical History Notes: hypothyroidism Genitourinary Medical History: Negative for: End Stage Renal Disease Immunological Medical History: Negative for: Lupus Erythematosus; Raynauds; Scleroderma Integumentary (Skin) Medical History: Negative for: History of Burn Musculoskeletal Medical History: Positive for: Osteoarthritis Negative for: Gout; Rheumatoid Arthritis; Osteomyelitis Neurologic Medical History: Negative for: Dementia; Neuropathy;  Quadriplegia; Paraplegia; Seizure Disorder Past Medical History Notes: lumbar spindylolysis Oncologic Medical History: Negative for: Received Chemotherapy; Received Radiation Past  Medical History Notes: BLE squamous ceel carcionoma Immunizations Pneumococcal Vaccine: Received Pneumococcal Vaccination: Yes Received Pneumococcal Vaccination On or After 60th Birthday: No Implantable Devices None Hospitalization / Surgery History Type of Hospitalization/Surgery removal of rod left leg Family and Social History Unknown History: Yes; Never smoker; Marital Status - Single; Alcohol Use: Never; Drug Use: No History; Caffeine Use: Never; Financial Concerns: No; Food, Clothing or Shelter Needs: No; Support System Lacking: No; Transportation Concerns: No Electronic Signature(s) Signed: 06/10/2021 3:21:34 PM By: Kalman Shan DO Signed: 06/11/2021 4:34:51 PM By: Deon Pilling RN, BSN Entered By: Kalman Shan on 06/10/2021 15:03:27 -------------------------------------------------------------------------------- SuperBill Details Patient Name: Date of Service: [name], Quanna A. 06/10/2021 Medical Record Number: [id number] Patient Account Number: [id number] Date of Birth/Sex: Treating RN: 1925-08-22 (88 y.o. [name], [name] Primary Care Provider: [name] Other Clinician: Referring Provider: Treating Provider/Extender: [name] in Treatment: 33 Diagnosis Coding ICD-10 Codes Code Description C44.92 Squamous cell carcinoma of skin, unspecified L97.812 Non-pressure chronic ulcer of other part of right lower leg with fat layer exposed L97.822 Non-pressure chronic ulcer of other part of left lower leg with fat layer exposed I89.0 Lymphedema, not elsewhere classified I87.313 Chronic venous hypertension (idiopathic) with ulcer of bilateral lower extremity L03.115 Cellulitis of right lower limb Facility Procedures CPT4: Code [id number]  [id number] Description: 81 BILATERAL: Application of multi-layer venous compression system; leg (below knee), including ankle and t. Modifier: Quantity: 1 Physician Procedures : CPT4 Code Description Modifier [id number] - WC PHYS LEVEL 4 - EST PT ICD-10 Diagnosis Description C44.92 Squamous cell carcinoma of skin, unspecified L97.822 Non-pressure chronic ulcer of other part of left lower leg with fat layer exposed L03.115  Cellulitis of right lower limb I89.0 Lymphedema, not elsewhere classified Quantity: 1 Electronic Signature(s) Signed: 06/10/2021 3:21:34 PM By: Kalman Shan DO Entered By: Kalman Shan on 06/10/2021 15:16:12

## 2021-06-13 DIAGNOSIS — Z23 Encounter for immunization: Secondary | ICD-10-CM | POA: Diagnosis not present

## 2021-06-18 ENCOUNTER — Encounter (HOSPITAL_BASED_OUTPATIENT_CLINIC_OR_DEPARTMENT_OTHER): Payer: Medicare Other | Admitting: Internal Medicine

## 2021-06-18 DIAGNOSIS — L97812 Non-pressure chronic ulcer of other part of right lower leg with fat layer exposed: Secondary | ICD-10-CM

## 2021-06-18 DIAGNOSIS — E039 Hypothyroidism, unspecified: Secondary | ICD-10-CM | POA: Diagnosis not present

## 2021-06-18 DIAGNOSIS — C4492 Squamous cell carcinoma of skin, unspecified: Secondary | ICD-10-CM | POA: Diagnosis not present

## 2021-06-18 DIAGNOSIS — L97822 Non-pressure chronic ulcer of other part of left lower leg with fat layer exposed: Secondary | ICD-10-CM | POA: Diagnosis not present

## 2021-06-18 DIAGNOSIS — I890 Lymphedema, not elsewhere classified: Secondary | ICD-10-CM

## 2021-06-18 DIAGNOSIS — I87313 Chronic venous hypertension (idiopathic) with ulcer of bilateral lower extremity: Secondary | ICD-10-CM | POA: Diagnosis not present

## 2021-06-18 NOTE — Progress Notes (Signed)
CARLEI HUANG ([id number]) , Visit Report for 06/18/2021 Arrival Information Details Patient Name: Date of Service: [name], [place]. 06/18/2021 1:30 PM Medical Record Number: [id number] Patient Account Number: [id number] Date of Birth/Sex: Treating RN: 1925-04-28 (88 y.o. [name], [name] Primary Care Alverda Nazzaro: [name] Other Clinician: Referring Landan Fedie: Treating Thomos Domine/Extender: [name] in Treatment: 65 Visit Information History Since Last Visit Added or deleted any medications: No Patient Arrived: Walker Any new allergies or adverse reactions: No Arrival Time: 13:23 Had a fall or experienced change in No Accompanied By: self activities of daily living that may affect Transfer Assistance: None risk of falls: Patient Identification Verified: Yes Signs or symptoms of abuse/neglect since last visito No Secondary Verification Process Completed: Yes Hospitalized since last visit: No Patient Requires Transmission-Based No Implantable device outside of the clinic excluding No Precautions: cellular tissue based products placed in the center Patient Has Alerts: Yes since last visit: Patient Alerts: R ABI = Non compressible Has Dressing in Place as Prescribed: Yes L ABI = Non compressible Has Compression in Place as Prescribed: Yes Pain Present Now: No Electronic Signature(s) Signed: 06/18/2021 4:42:51 PM By: Deon Pilling RN, BSN Entered By: Deon Pilling on 06/18/2021 13:24:03 -------------------------------------------------------------------------------- Compression Therapy Details Patient Name: Date of Service: [name], [place]. 06/18/2021 1:30 PM Medical Record Number: [id number] Patient Account Number: [id number] Date of Birth/Sex: Treating RN: 22-Dec-1925 (88 y.o. [name] Primary Care Kayzlee Wirtanen: [name] Other Clinician: Referring Julieann Drummonds: Treating Aarnav Steagall/Extender: [name] in Treatment: 41 Compression Therapy Performed for Wound Assessment: Wound #7RR Left,Circumferential Lower Leg Performed By: Clinician Deon Pilling, RN Compression Type: Three Layer Post Procedure Diagnosis Same as Pre-procedure Electronic Signature(s) Signed: 06/18/2021 4:42:51 PM By: Deon Pilling RN, BSN Entered By: Deon Pilling on 06/18/2021 14:03:18 -------------------------------------------------------------------------------- Compression Therapy Details Patient Name: Date of Service: [name], [place]. 06/18/2021 1:30 PM Medical Record Number: [id number] Patient Account Number: [id number] Date of Birth/Sex: Treating RN: December 31, 1925 (88 y.o. [name] Primary Care Ratasha Fabre: [name] Other Clinician: Referring Tenesia Escudero: Treating Yohanna Tow/Extender: [name] in Treatment: 31 Compression Therapy Performed for Wound Assessment: NonWound Condition Lymphedema - Right Leg Performed By: Clinician Deon Pilling, RN Compression Type: Three Layer Post Procedure Diagnosis Same as Pre-procedure Electronic Signature(s) Signed: 06/18/2021 4:42:51 PM By: Deon Pilling RN, BSN Entered By: Deon Pilling on 06/18/2021 14:03:28 -------------------------------------------------------------------------------- Encounter Discharge Information Details Patient Name: Date of Service: [name], [place]. 06/18/2021 1:30 PM Medical Record Number: [id number] Patient Account Number: [id number] Date of Birth/Sex: Treating RN: October 06, 1925 (88 y.o. [name] Primary Care Xiadani Damman: [name] Other Clinician: Referring Melroy Bougher: Treating Rion Schnitzer/Extender: [name] in Treatment: 28 Encounter Discharge Information Items Discharge Condition: Stable Ambulatory Status: Walker Discharge Destination: Home Transportation: Private Auto Accompanied By: self Schedule Follow-up Appointment:  Yes Clinical Summary of Care: Electronic Signature(s) Signed: 06/18/2021 4:42:51 PM By: Deon Pilling RN, BSN Entered By: Deon Pilling on 06/18/2021 14:04:05 -------------------------------------------------------------------------------- Lower Extremity Assessment Details Patient Name: Date of Service: [name], [place]. 06/18/2021 1:30 PM Medical Record Number: [id number] Patient Account Number: [id number] Date of Birth/Sex: Treating RN: Apr 13, 1925 (88 y.o. [name] Primary Care Darcie Mellone: [name] Other Clinician: Referring Tocarra Gassen: Treating Shakira Los/Extender: [name] in Treatment: 46 Edema Assessment Assessed: [Left: Yes] [Right: Yes] Edema: [Left: Yes] [Right: Yes] Calf Left: Right: Point of Measurement: 30 cm From Medial Instep 32  cm 34 cm Ankle Left: Right: Point of Measurement: 9 cm From Medial Instep 23 cm 22 cm Vascular Assessment Pulses: Dorsalis Pedis Palpable: [Left:Yes] [Right:Yes] Electronic Signature(s) Signed: 06/18/2021 4:42:51 PM By: Deon Pilling RN, BSN Entered By: Deon Pilling on 06/18/2021 13:28:46 -------------------------------------------------------------------------------- Multi Wound Chart Details Patient Name: Date of Service: [name], [place]ha A. 06/18/2021 1:30 PM Medical Record Number: [id number] Patient Account Number: [id number] Date of Birth/Sex: Treating RN: 15-Dec-1925 (88 y.o. [name] Primary Care Nyal Schachter: [name] Other Clinician: Referring Keegen Heffern: Treating Undray Allman/Extender: [name] in Treatment: 64 Vital Signs Height(in): 65 Pulse(bpm): 75 Weight(lbs): 163 Blood Pressure(mmHg): 118/71 Body Mass Index(BMI): 27.1 Temperature(F): 97.6 Respiratory Rate(breaths/min): 20 Photos: [N/A:N/A] Left, Circumferential Lower Leg N/A N/A Wound Location: Blister N/A N/A Wounding Event: Malignant Wound N/A N/A Primary  Etiology: Anemia, Arrhythmia, Congestive Heart N/A N/A Comorbid History: Failure, Hypertension, Colitis, Osteoarthritis 04/20/2020 N/A N/A Date Acquired: 71 N/A N/A Weeks of Treatment: Open N/A N/A Wound Status: Yes N/A N/A Wound Recurrence: Yes N/A N/A Clustered Wound: 0.2x0.2x0.1 N/A N/A Measurements L x W x D (cm) 0.031 N/A N/A A (cm) : rea 0.003 N/A N/A Volume (cm) : 99.00% N/A N/A % Reduction in Area: 99.00% N/A N/A % Reduction in Volume: Full Thickness Without Exposed N/A N/A Classification: Support Structures Medium N/A N/A Exudate Amount: Serosanguineous N/A N/A Exudate Type: red, brown N/A N/A Exudate Color: Distinct, outline attached N/A N/A Wound Margin: Large (67-100%) N/A N/A Granulation Amount: Red, Pink N/A N/A Granulation Quality: None Present (0%) N/A N/A Necrotic Amount: Fat Layer (Subcutaneous Tissue): Yes N/A N/A Exposed Structures: Fascia: No Tendon: No Muscle: No Joint: No Bone: No Large (67-100%) N/A N/A Epithelialization: Compression Therapy N/A N/A Procedures Performed: Treatment Notes Wound #7RR (Lower Leg) Wound Laterality: Left, Circumferential Cleanser Soap and Water Discharge Instruction: May shower and wash wound with dial antibacterial soap and water prior to dressing change. Wound Cleanser Discharge Instruction: Cleanse the wound with wound cleanser prior to applying a clean dressing using gauze sponges, not tissue or cotton balls. Peri-Wound Care Sween Lotion (Moisturizing lotion) Discharge Instruction: Apply moisturizing lotion as directed Topical Primary Dressing KerraCel Ag Gelling Fiber Dressing, 4x5 in (silver alginate) Discharge Instruction: Apply silver alginate to wound bed as instructed Secondary Dressing ABD Pad, 8x10 Discharge Instruction: Apply over primary dressing as directed. Woven Gauze Sponge, Non-Sterile 4x4 in Discharge Instruction: Apply over primary dressing as directed. Secured  With Compression Wrap ThreePress (3 layer compression wrap) Discharge Instruction: Apply three layer compression as directed. Compression Stockings Add-Ons Electronic Signature(s) Signed: 06/18/2021 3:01:14 PM By: Kalman Shan DO Signed: 06/18/2021 4:42:51 PM By: Deon Pilling RN, BSN Entered By: Kalman Shan on 06/18/2021 14:54:48 -------------------------------------------------------------------------------- [place] Details Patient Name: Date of Service: [name], [place]. 06/18/2021 1:30 PM Medical Record Number: [id number] Patient Account Number: [id number] Date of Birth/Sex: Treating RN: [birth year] (88 y.o. [name] Primary Care Adeel Guiffre: [name] Other Clinician: Referring Keiandra Sullenger: Treating Lucrecia Mcphearson/Extender: [name] in Treatment: 66 Multidisciplinary Care Plan reviewed with physician Active Inactive Venous Leg Ulcer Nursing Diagnoses: Knowledge deficit related to disease process and management Potential for venous Insuffiency (use before diagnosis confirmed) Goals: Patient will maintain optimal edema control Date Initiated: 11/01/2020 Target Resolution Date: 07/18/2021 Goal Status: Active Interventions: Assess peripheral edema status every visit. Compression as ordered Provide education on venous insufficiency Treatment Activities: Therapeutic compression applied : 11/01/2020 Notes: 12/27/20: Edema control ongoing 01/24/21: Edema control continues, using 3 layer  compression Wound/Skin Impairment Nursing Diagnoses: Impaired tissue integrity Knowledge deficit related to ulceration/compromised skin integrity Goals: Patient/caregiver will verbalize understanding of skin care regimen Date Initiated: 06/11/2020 Target Resolution Date: 07/18/2021 Goal Status: Active Interventions: Assess patient/caregiver ability to obtain necessary supplies Assess patient/caregiver ability to perform  ulcer/skin care regimen upon admission and as needed Assess ulceration(s) every visit Provide education on ulcer and skin care Notes: 10/11/20: Wound care regimen ongoing. 12/27/20: Wound care continues Electronic Signature(s) Signed: 06/18/2021 4:42:51 PM By: Deon Pilling RN, BSN Entered By: Deon Pilling on 06/18/2021 13:56:12 -------------------------------------------------------------------------------- Pain Assessment Details Patient Name: Date of Service: [name], [place]. 06/18/2021 1:30 PM Medical Record Number: [id number] Patient Account Number: [id number] Date of Birth/Sex: Treating RN: 10/27/1925 (88 y.o. [name] Primary Care Smaran Gaus: [name] Other Clinician: Referring Baylin Cabal: Treating Donnabelle Blanchard/Extender: [name] in Treatment: 57 Active Problems Location of Pain Severity and Description of Pain Patient Has Paino No Site Locations Rate the pain. Rate the pain. Current Pain Level: 0 Pain Management and Medication Current Pain Management: Medication: No Cold Application: No Rest: No Massage: No Activity: No T.E.N.S.: No Heat Application: No Leg drop or elevation: No Is the Current Pain Management Adequate: Adequate How does your wound impact your activities of daily livingo Sleep: No Bathing: No Appetite: No Relationship With Others: No Bladder Continence: No Emotions: No Bowel Continence: No Work: No Toileting: No Drive: No Dressing: No Hobbies: No Engineer, maintenance) Signed: 06/18/2021 4:42:51 PM By: Deon Pilling RN, BSN Entered By: Deon Pilling on 06/18/2021 13:24:22 -------------------------------------------------------------------------------- Patient/Caregiver Education Details Patient Name: Date of Service: [name], Jaonna A. 5/30/2023andnbsp1:30 PM Medical Record Number: [id number] Patient Account Number: [id number] Date of Birth/Gender: Treating RN: Oct 09, 1925 (88 y.o. [name] Primary Care Physician: [name] Other Clinician: Referring Physician: Treating Physician/Extender: [name] in Treatment: 52 Education Assessment Education Provided To: Patient Education Topics Provided Wound/Skin Impairment: Handouts: Skin Care Do's and Dont's Methods: Explain/Verbal Responses: Reinforcements needed Electronic Signature(s) Signed: 06/18/2021 4:42:51 PM By: Deon Pilling RN, BSN Entered By: Deon Pilling on 06/18/2021 13:56:27 -------------------------------------------------------------------------------- Wound Assessment Details Patient Name: Date of Service: [name], [place]. 06/18/2021 1:30 PM Medical Record Number: [id number] Patient Account Number: [id number] Date of Birth/Sex: Treating RN: 02-23-1925 (88 y.o. [name], [name] Primary Care Harles Evetts: [name] Other Clinician: Referring Natassja Ollis: Treating Aariona Momon/Extender: [name] in Treatment: 92 Wound Status Wound Number: 7RR Primary Malignant Wound Etiology: Wound Location: Left, Circumferential Lower Leg Wound Open Wounding Event: Blister Status: Date Acquired: 04/20/2020 Comorbid Anemia, Arrhythmia, Congestive Heart Failure, Hypertension, Weeks Of Treatment: 53 History: Colitis, Osteoarthritis Clustered Wound: Yes Photos Wound Measurements Length: (cm) 0.2 Width: (cm) 0.2 Depth: (cm) 0.1 Area: (cm) 0.031 Volume: (cm) 0.003 % Reduction in Area: 99% % Reduction in Volume: 99% Epithelialization: Large (67-100%) Tunneling: No Wound Description Classification: Full Thickness Without Exposed Support Structures Wound Margin: Distinct, outline attached Exudate Amount: Medium Exudate Type: Serosanguineous Exudate Color: red, brown Foul Odor After Cleansing: No Slough/Fibrino No Wound Bed Granulation Amount: Large (67-100%) Exposed Structure Granulation Quality: Red, Pink Fascia  Exposed: No Necrotic Amount: None Present (0%) Fat Layer (Subcutaneous Tissue) Exposed: Yes Tendon Exposed: No Muscle Exposed: No Joint Exposed: No Bone Exposed: No Treatment Notes Wound #7RR (Lower Leg) Wound Laterality: Left, Circumferential Cleanser Soap and Water Discharge Instruction: May shower and wash wound with dial antibacterial soap and water prior to dressing change. Wound Cleanser Discharge Instruction: Cleanse  the wound with wound cleanser prior to applying a clean dressing using gauze sponges, not tissue or cotton balls. Peri-Wound Care Sween Lotion (Moisturizing lotion) Discharge Instruction: Apply moisturizing lotion as directed Topical Primary Dressing KerraCel Ag Gelling Fiber Dressing, 4x5 in (silver alginate) Discharge Instruction: Apply silver alginate to wound bed as instructed Secondary Dressing ABD Pad, 8x10 Discharge Instruction: Apply over primary dressing as directed. Woven Gauze Sponge, Non-Sterile 4x4 in Discharge Instruction: Apply over primary dressing as directed. Secured With Compression Wrap ThreePress (3 layer compression wrap) Discharge Instruction: Apply three layer compression as directed. Compression Stockings Add-Ons Electronic Signature(s) Signed: 06/18/2021 4:42:51 PM By: Deon Pilling RN, BSN Entered By: Deon Pilling on 06/18/2021 13:30:27 -------------------------------------------------------------------------------- Vitals Details Patient Name: Date of Service: [name], [place]. 06/18/2021 1:30 PM Medical Record Number: [id number] Patient Account Number: [id number] Date of Birth/Sex: Treating RN: Jun 19, 1925 (88 y.o. [name], [name] Primary Care [place]: [name] Other Clinician: Referring Hanish Laraia: Treating Khori Rosevear/Extender: [name] in Treatment: 20 Vital Signs Time Taken: 13:20 Temperature (F): 97.6 Height (in): 65 Pulse (bpm): 75 Weight (lbs): 163 Respiratory Rate  (breaths/min): 20 Body Mass Index (BMI): 27.1 Blood Pressure (mmHg): 118/71 Reference Range: 80 - 120 mg / dl Electronic Signature(s) Signed: 06/18/2021 4:42:51 PM By: Deon Pilling RN, BSN Entered By: Deon Pilling on 06/18/2021 13:24:15

## 2021-06-18 NOTE — Progress Notes (Signed)
[name] ([id number]) , Visit Report for 06/18/2021 Chief Complaint Document Details Patient Name: Date of Service: [name], [name]. 06/18/2021 1:30 PM Medical Record Number: [id number] Patient Account Number: [id number] Date of Birth/Sex: Treating RN: 1925/10/06 (88 y.o. [name] Primary Care Provider: [name] Other Clinician: Referring Provider: Treating Provider/Extender: [name] in Treatment: 33 Information Obtained from: Patient Chief Complaint Bilateral lower extremity wounds that have been biopsied and positive for squamous cell carcinoma Bilateral lower extremity wounds due to chronic venous insufficiency/lymphedema Electronic Signature(s) Signed: 06/18/2021 3:01:14 PM By: Kalman Shan DO Entered By: Kalman Shan on 06/18/2021 14:56:02 -------------------------------------------------------------------------------- HPI Details Patient Name: Date of Service: [name], [name] 06/18/2021 1:30 PM Medical Record Number: [id number] Patient Account Number: [id number] Date of Birth/Sex: Treating RN: 06-Mar-1925 (88 y.o. [name] Primary Care Provider: [name] Other Clinician: Referring Provider: Treating Provider/Extender: [name] in Treatment: 25 History of Present Illness Location: left leg HPI Description: Admission 5/23 Ms. 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 She states that the wound on the right has been present for a year. The 1 on the left has opened up 1 month ago. She is followed with oncology for this issue as she had biopsies that showed squamous cell carcinoma. She is also seeing radiation oncology for treatment  options. She presents today because she would like for her wounds to be healed by [place]. She currently denies signs of infection. 6/1; patient presents for 1 week follow-up. She states she has tolerated the leg wraps well. She states these do not bother her and is happy to continue with them. She is scheduled to see her oncologist today to go over treatment options for the bilateral lower extremity squamous cell carcinoma. Radiation is currently not a recommended option. Patient states she overall feels well. 6/22; patient presents for 3-week follow-up. She has tolerated the wraps well until her last wrap where she states they were uncomfortable. She attributes this to the home health nurse. She denies signs of infection. She has started her first treatment of antibody infusions for her Bilateral lower extremity squamous cell carcinoma. She has no complaints today. 7/21; patient presents for 1 month follow-up. Unfortunately she has not had good experience with her wrap changes with home health. She would like to do her own dressing changes. She continues to do her antibody infusions. She denies signs of infection. 7/28; patient presents for 1 week follow-up. At last clinic visit she was switched to daily dressing changes due to issues with the wrap and home health placing them. Unfortunately she has developed weeping to her legs bilaterally. She would like to be placed in wraps today. She would also like to follow with [place] weekly for wrap changes instead of having home health change them. She denies signs of infection. 8/4; patient presents for 1 week follow-up. She has tolerated the Kerlix/Coban wraps well. She no longer has weeping to her legs. She took the wrap off 1 day before coming in to be able to take a shower. She has no issues or complaints today. She denies signs of infection. 8/18; patient presents for follow-up. Patient has tolerated the wraps well. She brought her Velcro compression wraps  today. She has no  issues or complaints today. She had her chemotherapy infusion yesterday without issues. She denies signs of infection. 8/25; patient presents for follow-up. She used her juxta lite compressions for the past week. It is unclear if she is able to put these on correctly since she states she has a hard time getting them to look right. She reports 2 open wounds. She currently denies signs of infection. 9/1; patient presents for 1 week follow-up. She has 1 open wound. She tolerated the compression wraps well. She currently denies signs of infection. 9/8; patient presents for 1 week follow-up. She has 2 open wounds 1 on each leg. She has tolerated the compression wrap well. She currently denies signs of infection. 9/15; patient presents for 1 week follow-up. She now has 3 wounds. 2 on the left and 1 on the right. She continues to tolerate the compression wrap well. She currently denies signs of infection. She has obtained furosemide by her primary care physician and would like to discuss when to take this. 9/22; patient presents for 1 week follow-up. She has scattered wounds on her lower extremities bilaterally. She did take furosemide twice in the past week. She does not recall having to urinate more frequently. She tolerated the 3 layer compression well. She denies signs of infection. 10/13; patient has not been here recently because of [place]. Apparently the facility was only putting gauze on her legs. This is a patient I do not normally see. 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 Ronnald Ramp at [facility] dermatology. She is quite convincing that she did not have radiation to her lower legs. It is likely she also has significant chronic venous insufficiency stasis dermatitis. We have been using silver alginate under kerlix Coban. She has obvious open areas medially on the right and areas on the left. She also has  areas of extensive dry flaking adherent areas on the right and to a lesser extent on the left anterior. Nodular areas on the left lateral lower leg left posterior calf and right mid calf medially. 10/21; patient presents for follow-up. She has no issues or complaints today. She has tolerated the 3 layer compression well bilaterally. She denies signs of infection. 10/27; patient presents for follow-up. She continues to tolerate the 3 layer compression wrap well. She denies signs of infection. 11/30; patient presents for follow-up. She has no issues or complaints today. 11/10; patient arrived in clinic today for nurse visit accompanied by her daughter from [place]. The daughter had multiple questions so we turned this into doctors visit. Apparently after the last time I saw this woman in October she went to see Dr. Ronnald Ramp but he did not take the wraps off. She previously was treated with Cemiplimab for her squamous cell carcinoma. She did not receive radiation to her legs. I had wanted Dr. Ronnald Ramp to look at this because of the exceptionally damaged skin on her lower legs bilaterally. She has odd looking wounds on the left medial lower leg also extending posteriorly which we have not made a lot of progress on. But she also has a raised hyperkeratotic nodules on the right anterior tibial area plaques on the left medial lower thigh. These are not areas that are under compression. We have been using silver alginate on any open areas Liberal TCA under 3 layer compression. 11/17; patient presents for follow-up. She had her wraps taken off yesterday for her dermatology appointment. She reports excessive  weeping to her legs bilaterally after the wraps were taken off. She currently denies signs of infection. 12/12/2020 upon evaluation today patient appears to be doing about the same in regard to her wounds. She was actually started on Cipro after having biopsies apparently at her dermatology clinic she does not  have the results back from the actual biopsy but the culture did return and apparently they placed her on the Cipro she started that just this morning. Other than that her legs appear to be doing okay at this time. 12/1; patient presents for follow-up. She has no issues or complaints today. She has started Lasix 40 mg daily to help with her bilateral leg swelling. 12/8; patient presents for follow-up. She has no issues or complaints today. 12/15; patient presents for 1 week follow-up. She has no issues or complaints today. 12/22; patient presents for follow-up. She has no issues or complaints today. 1/5; patient presents for follow-up. She has no issues or complaints today. She has tolerated the compression wraps well. 1/12; patient presents for follow-up. She is tolerated the compression wrap well and has no issues or complaints today. She denies signs of infection. 1/19; patient presents for follow-up. She took the compression wraps off 1 to 2 days ago to take a shower and did not have compression wraps replaced. She reports increased blistering throughout her legs bilaterally. She currently denies signs of infection. 1/26; patient presents for follow-up. She has no issues or complaints today. She tolerated the compression wraps well. She has not heard from oncology to schedule an appointment. 2/2; patient presents for follow-up. She has no issues or complaints today. She continues to tolerate the compression wrap well. 2/16; patient presents for follow-up. She has no issues or complaints today. 2/23; patient presents for follow-up. She has no issues or complaints today. Tomorrow she has an appointment with oncology to look at the suspicious lesion on her right lower extremity. She currently denies signs of infection. 3/2; patient presents for follow-up. She has been using her juxta light compression to the right lower extremity however there has been increased swelling and opening of wounds  throughout the legs with weeping. She currently denies signs of infection. She had no issues with the compression wrap to the left lower extremity. 3/16; patient presents for follow-up. She has no issues or complaints today. She states that she has been restarted on infusions for her squamous cell carcinoma of her legs. She has also been increased on Lasix to 40 mg twice daily. She has noticed a decrease in swelling to her legs. 3/23; patient presents for follow-up. She starts her second infusion next week for her squamous cell carcinoma lesions to her legs. She reports no issues with the compression wraps. 3/30; patient presents for follow-up. She has no issues or complaints today. 4/6; patient presents for follow-up. Her left lower extremity wounds have healed. She has her juxta light compression with her today. She has no issues or complaints today. 4/13; this is a patient we actually discharged to juxta lite stockings on the left leg the last time she was here. She came in on Monday for [place] to look at the leg and a nurse visit. She had massive swelling and numerous blisters and wound reopening. We put her back in 3 layer compression although I did not generate a note. Silver alginate on the wounded areas. We have also been doing the same on the right. In the meantime she is obtained her external compression pumps that were  previously ordered and she started to use them. She has skin cancers that are obvious on the right leg x2 her appointment with Dr. Jarome Matin of dermatology is on May 5.. She is also receiving weekly chemotherapy for recurrent presumably squamous cell carcinomas apparently has had 2 treatments 4/20; patient presents for follow-up. She has her new juxta lite compressions today. She continues to have open wounds to the left lower extremity. She has follow-up with dermatology in 2 weeks. She continues to have IV infusions for her squamous cell carcinoma lesions on her legs. 4/28;  the patient has bilateral lower extremity wounds predominantly on the right lateral upper leg, right anterior lower leg which in itself is probably a recurrent cancer as well as the left lateral lower leg. She has recurrent squamous cell carcinoma currently being treated with an IV infusion. She also has scattered nodules on her upper lower legs and thighs I am not certain of all of this is felt to be malignant as well We have been using silver alginate on the open areas wrapping her legs. She also has her external compression pumps at home but I am not clear that she is going to use these 5/2; patient presents for follow-up. She has not been taking her diuretics as prescribed. She sees Dr. Dian Situ, dermatology at the end of the week. She tolerated the compression wraps well today. She has her juxta lite compressions. 5/8; patient presents for follow-up. She saw Dr. Dian Situ, dermatology and she has nothing to report on. She has been using her juxta lite compression to the right lower extremity. She has tolerated the compression wrap well in the left lower extremity. She has not been taking her diuretic. She is scheduled to continue her infusions for her squamous cell carcinoma on her legs. 5/18; patient presents for follow-up. We wrapped her in 3 layer compression at last clinic visit. She has no issues or complaints today. 5/22; patient presents for follow-up. She has been wrapped in 3 layer compression bilaterally to her lower extremities over the past week. She has been scratching at the top of the wrap and picking at her skin. This area has increased warmth and erythema. 5/30; patient presents for follow-up. She completed Keflex with resolution of her symptoms to the right lower extremity. She no longer has increased warmth or erythema. She continues her infusions for her squamous cell carcinoma lesions. We continue to wrap her in 3 layer compression with silver alginate. She currently denies signs of  infection. Electronic Signature(s) Signed: 06/18/2021 3:01:14 PM By: Kalman Shan DO Entered By: Kalman Shan on 06/18/2021 14:57:08 -------------------------------------------------------------------------------- Physical Exam Details Patient Name: Date of Service: [name], [name]. 06/18/2021 1:30 PM Medical Record Number: [id number] Patient Account Number: [id number] Date of Birth/Sex: Treating RN: 09-Feb-1925 (88 y.o. [name] Primary Care Provider: [name] Other Clinician: Referring Provider: Treating Provider/Extender: [name] in Treatment: 14 Constitutional respirations regular, non-labored and within target range for patient.. Cardiovascular 2+ dorsalis pedis/posterior tibialis pulses. Psychiatric pleasant and cooperative. Notes Right lower extremity: Scattered areas limited to skin breakdown. 2 areas suspicious for malignancy. 1 of these has been confirmed squamous cell carcinoma. Left lower extremity: Area of skin breakdown to the posterior aspect. Granulation tissue throughout. She also has another suspicious lesion for malignancy to the lateral aspect. No surrounding signs of infection T any of the open areas. o Electronic Signature(s) Signed: 06/18/2021 3:01:14 PM By: Kalman Shan DO Entered By: Kalman Shan on  06/18/2021 14:58:41 -------------------------------------------------------------------------------- Physician Orders Details Patient Name: Date of Service: [name], [name]. 06/18/2021 1:30 PM Medical Record Number: [id number] Patient Account Number: [id number] Date of Birth/Sex: Treating RN: 1925-02-24 (88 y.o. [name], [name] Primary Care Provider: [name] Other Clinician: Referring Provider: Treating Provider/Extender: [name] in Treatment: 73 Verbal / Phone Orders: No Diagnosis Coding ICD-10 Coding Code Description C44.92 Squamous  cell carcinoma of skin, unspecified L97.812 Non-pressure chronic ulcer of other part of right lower leg with fat layer exposed L97.822 Non-pressure chronic ulcer of other part of left lower leg with fat layer exposed I89.0 Lymphedema, not elsewhere classified I87.313 Chronic venous hypertension (idiopathic) with ulcer of bilateral lower extremity Follow-up Appointments ppointment in 1 week. - Dr. Heber [place] and [place], Room 8 06/27/2021 130pm Thursday Return A Dr. Heber [place] and [place], Room 8 07/02/2021 130pm Tuesday Other: - ***Need to call your dermatologist for him to look at your legs as you have more areas that are concerning.*** ***Nurse will call your dermatologist to update them.*** Bathing/ Shower/ Hygiene May shower with protection but do not get wound dressing(s) wet. - Use cast protector Edema Control - Lymphedema / SCD / Other Lymphedema Pumps. Use Lymphedema pumps on leg(s) 2-3 times a day for 45-60 minutes. If wearing any wraps or hose, do not remove them. Continue exercising as instructed. - use one hour daily. Elevate legs to the level of the heart or above for 30 minutes daily and/or when sitting, a frequency of: - elevate the legs throughout the day heart level if possible. Avoid standing for long periods of time. Exercise regularly Additional Orders / Instructions Follow Nutritious Diet Non Wound Condition Right Lower Extremity Other Non Wound Condition Orders/Instructions: - lotion, calcium alginate Ag, abd pad, 3 layer compression to right leg. Wound Treatment Wound #7RR - Lower Leg Wound Laterality: Left, Circumferential Cleanser: Soap and Water 1 x Per Week/30 Days Discharge Instructions: May shower and wash wound with dial antibacterial soap and water prior to dressing change. Cleanser: Wound Cleanser 1 x Per Week/30 Days Discharge Instructions: Cleanse the wound with wound cleanser prior to applying a clean dressing using gauze sponges, not tissue or cotton  balls. Peri-Wound Care: Sween Lotion (Moisturizing lotion) 1 x Per Week/30 Days Discharge Instructions: Apply moisturizing lotion as directed Prim Dressing: KerraCel Ag Gelling Fiber Dressing, 4x5 in (silver alginate) 1 x Per Week/30 Days ary Discharge Instructions: Apply silver alginate to wound bed as instructed Secondary Dressing: ABD Pad, 8x10 1 x Per Week/30 Days Discharge Instructions: Apply over primary dressing as directed. Secondary Dressing: Woven Gauze Sponge, Non-Sterile 4x4 in 1 x Per Week/30 Days Discharge Instructions: Apply over primary dressing as directed. Compression Wrap: ThreePress (3 layer compression wrap) 1 x Per Week/30 Days Discharge Instructions: Apply three layer compression as directed. Electronic Signature(s) Signed: 06/18/2021 3:01:14 PM By: Kalman Shan DO Entered By: Kalman Shan on 06/18/2021 14:58:53 -------------------------------------------------------------------------------- Problem List Details Patient Name: Date of Service: [name], [name]. 06/18/2021 1:30 PM Medical Record Number: [id number] Patient Account Number: [id number] Date of Birth/Sex: Treating RN: 07/01/1925 (88 y.o. [name] Primary Care Provider: [name] Other Clinician: Referring Provider: Treating Provider/Extender: [name] in Treatment: 46 Active Problems ICD-10 Encounter Code Description Active Date MDM Diagnosis C44.92 Squamous cell carcinoma of skin, unspecified 06/11/2020 No Yes L97.812 Non-pressure chronic ulcer of other part of right lower leg with fat layer 06/11/2020 No Yes exposed L97.822 Non-pressure chronic ulcer of other part  of left lower leg with fat layer exposed5/23/2022 No Yes I89.0 Lymphedema, not elsewhere classified 01/31/2021 No Yes I87.313 Chronic venous hypertension (idiopathic) with ulcer of bilateral lower extremity 03/21/2021 No Yes Inactive Problems Resolved Problems Electronic  Signature(s) Signed: 06/18/2021 3:01:14 PM By: Kalman Shan DO Entered By: Kalman Shan on 06/18/2021 14:54:38 -------------------------------------------------------------------------------- Progress Note Details Patient Name: Date of Service: [name], [name] 06/18/2021 1:30 PM Medical Record Number: [id number] Patient Account Number: [id number] Date of Birth/Sex: Treating RN: 06-09-1925 (88 y.o. [name], [name] Primary Care Provider: [name] Other Clinician: Referring Provider: Treating Provider/Extender: [name] in Treatment: 61 Subjective Chief Complaint Information obtained from Patient Bilateral lower extremity wounds that have been biopsied and positive for squamous cell carcinoma Bilateral lower extremity wounds due to chronic venous insufficiency/lymphedema History of Present Illness (HPI) The following HPI elements were documented for the patient's wound: Location: left leg Admission 5/23 Ms. 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 She states that the wound on the right has been present for a year. The 1 on the left has opened up 1 month ago. She is followed with oncology for this issue as she had biopsies that showed squamous cell carcinoma. She is also seeing radiation oncology for treatment options. She presents today because she would like for her wounds to be healed by [place]. She currently denies signs of infection. 6/1; patient presents for 1 week follow-up. She states she has tolerated the leg wraps well. She states these do not bother her and is happy to continue with them. She is scheduled to see her oncologist today to go over treatment options for the bilateral lower extremity  squamous cell carcinoma. Radiation is currently not a recommended option. Patient states she overall feels well. 6/22; patient presents for 3-week follow-up. She has tolerated the wraps well until her last wrap where she states they were uncomfortable. She attributes this to the home health nurse. She denies signs of infection. She has started her first treatment of antibody infusions for her Bilateral lower extremity squamous cell carcinoma. She has no complaints today. 7/21; patient presents for 1 month follow-up. Unfortunately she has not had good experience with her wrap changes with home health. She would like to do her own dressing changes. She continues to do her antibody infusions. She denies signs of infection. 7/28; patient presents for 1 week follow-up. At last clinic visit she was switched to daily dressing changes due to issues with the wrap and home health placing them. Unfortunately she has developed weeping to her legs bilaterally. She would like to be placed in wraps today. She would also like to follow with [place] weekly for wrap changes instead of having home health change them. She denies signs of infection. 8/4; patient presents for 1 week follow-up. She has tolerated the Kerlix/Coban wraps well. She no longer has weeping to her legs. She took the wrap off 1 day before coming in to be able to take a shower. She has no issues or complaints today. She denies signs of infection. 8/18; patient presents for follow-up. Patient has tolerated the wraps well. She brought her Velcro compression wraps today. She has no issues or complaints today. She had her chemotherapy infusion yesterday without issues. She denies signs of infection. 8/25;  patient presents for follow-up. She used her juxta lite compressions for the past week. It is unclear if she is able to put these on correctly since she states she has a hard time getting them to look right. She reports 2 open wounds. She currently denies  signs of infection. 9/1; patient presents for 1 week follow-up. She has 1 open wound. She tolerated the compression wraps well. She currently denies signs of infection. 9/8; patient presents for 1 week follow-up. She has 2 open wounds 1 on each leg. She has tolerated the compression wrap well. She currently denies signs of infection. 9/15; patient presents for 1 week follow-up. She now has 3 wounds. 2 on the left and 1 on the right. She continues to tolerate the compression wrap well. She currently denies signs of infection. She has obtained furosemide by her primary care physician and would like to discuss when to take this. 9/22; patient presents for 1 week follow-up. She has scattered wounds on her lower extremities bilaterally. She did take furosemide twice in the past week. She does not recall having to urinate more frequently. She tolerated the 3 layer compression well. She denies signs of infection. 10/13; patient has not been here recently because of [place]. Apparently the facility was only putting gauze on her legs. This is a patient I do not normally see. 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 Ronnald Ramp at [facility] dermatology. She is quite convincing that she did not have radiation to her lower legs. It is likely she also has significant chronic venous insufficiency stasis dermatitis. We have been using silver alginate under kerlix Coban. She has obvious open areas medially on the right and areas on the left. She also has areas of extensive dry flaking adherent areas on the right and to a lesser extent on the left anterior. Nodular areas on the left lateral lower leg left posterior calf and right mid calf medially. 10/21; patient presents for follow-up. She has no issues or complaints today. She has tolerated the 3 layer compression well bilaterally. She denies signs of infection. 10/27; patient presents for  follow-up. She continues to tolerate the 3 layer compression wrap well. She denies signs of infection. 11/30; patient presents for follow-up. She has no issues or complaints today. 11/10; patient arrived in clinic today for nurse visit accompanied by her daughter from [place]. The daughter had multiple questions so we turned this into doctors visit. Apparently after the last time I saw this woman in October she went to see Dr. Ronnald Ramp but he did not take the wraps off. She previously was treated with Cemiplimab for her squamous cell carcinoma. She did not receive radiation to her legs. I had wanted Dr. Ronnald Ramp to look at this because of the exceptionally damaged skin on her lower legs bilaterally. She has odd looking wounds on the left medial lower leg also extending posteriorly which we have not made a lot of progress on. But she also has a raised hyperkeratotic nodules on the right anterior tibial area plaques on the left medial lower thigh. These are not areas that are under compression. We have been using silver alginate on any open areas Liberal TCA under 3 layer compression. 11/17; patient presents for follow-up. She had her wraps taken off yesterday for her dermatology appointment. She reports excessive weeping to her legs bilaterally after the wraps were taken off. She currently denies signs of infection.  12/12/2020 upon evaluation today patient appears to be doing about the same in regard to her wounds. She was actually started on Cipro after having biopsies apparently at her dermatology clinic she does not have the results back from the actual biopsy but the culture did return and apparently they placed her on the Cipro she started that just this morning. Other than that her legs appear to be doing okay at this time. 12/1; patient presents for follow-up. She has no issues or complaints today. She has started Lasix 40 mg daily to help with her bilateral leg swelling. 12/8; patient presents for  follow-up. She has no issues or complaints today. 12/15; patient presents for 1 week follow-up. She has no issues or complaints today. 12/22; patient presents for follow-up. She has no issues or complaints today. 1/5; patient presents for follow-up. She has no issues or complaints today. She has tolerated the compression wraps well. 1/12; patient presents for follow-up. She is tolerated the compression wrap well and has no issues or complaints today. She denies signs of infection. 1/19; patient presents for follow-up. She took the compression wraps off 1 to 2 days ago to take a shower and did not have compression wraps replaced. She reports increased blistering throughout her legs bilaterally. She currently denies signs of infection. 1/26; patient presents for follow-up. She has no issues or complaints today. She tolerated the compression wraps well. She has not heard from oncology to schedule an appointment. 2/2; patient presents for follow-up. She has no issues or complaints today. She continues to tolerate the compression wrap well. 2/16; patient presents for follow-up. She has no issues or complaints today. 2/23; patient presents for follow-up. She has no issues or complaints today. Tomorrow she has an appointment with oncology to look at the suspicious lesion on her right lower extremity. She currently denies signs of infection. 3/2; patient presents for follow-up. She has been using her juxta light compression to the right lower extremity however there has been increased swelling and opening of wounds throughout the legs with weeping. She currently denies signs of infection. She had no issues with the compression wrap to the left lower extremity. 3/16; patient presents for follow-up. She has no issues or complaints today. She states that she has been restarted on infusions for her squamous cell carcinoma of her legs. She has also been increased on Lasix to 40 mg twice daily. She has noticed a  decrease in swelling to her legs. 3/23; patient presents for follow-up. She starts her second infusion next week for her squamous cell carcinoma lesions to her legs. She reports no issues with the compression wraps. 3/30; patient presents for follow-up. She has no issues or complaints today. 4/6; patient presents for follow-up. Her left lower extremity wounds have healed. She has her juxta light compression with her today. She has no issues or complaints today. 4/13; this is a patient we actually discharged to juxta lite stockings on the left leg the last time she was here. She came in on Monday for [place] to look at the leg and a nurse visit. She had massive swelling and numerous blisters and wound reopening. We put her back in 3 layer compression although I did not generate a note. Silver alginate on the wounded areas. We have also been doing the same on the right. In the meantime she is obtained her external compression pumps that were previously ordered and she started to use them. She has skin cancers that are obvious on the  right leg x2 her appointment with Dr. Jarome Matin of dermatology is on May 5.. She is also receiving weekly chemotherapy for recurrent presumably squamous cell carcinomas apparently has had 2 treatments 4/20; patient presents for follow-up. She has her new juxta lite compressions today. She continues to have open wounds to the left lower extremity. She has follow-up with dermatology in 2 weeks. She continues to have IV infusions for her squamous cell carcinoma lesions on her legs. 4/28; the patient has bilateral lower extremity wounds predominantly on the right lateral upper leg, right anterior lower leg which in itself is probably a recurrent cancer as well as the left lateral lower leg. She has recurrent squamous cell carcinoma currently being treated with an IV infusion. She also has scattered nodules on her upper lower legs and thighs I am not certain of all of this is felt  to be malignant as well We have been using silver alginate on the open areas wrapping her legs. She also has her external compression pumps at home but I am not clear that she is going to use these 5/2; patient presents for follow-up. She has not been taking her diuretics as prescribed. She sees Dr. Dian Situ, dermatology at the end of the week. She tolerated the compression wraps well today. She has her juxta lite compressions. 5/8; patient presents for follow-up. She saw Dr. Dian Situ, dermatology and she has nothing to report on. She has been using her juxta lite compression to the right lower extremity. She has tolerated the compression wrap well in the left lower extremity. She has not been taking her diuretic. She is scheduled to continue her infusions for her squamous cell carcinoma on her legs. 5/18; patient presents for follow-up. We wrapped her in 3 layer compression at last clinic visit. She has no issues or complaints today. 5/22; patient presents for follow-up. She has been wrapped in 3 layer compression bilaterally to her lower extremities over the past week. She has been scratching at the top of the wrap and picking at her skin. This area has increased warmth and erythema. 5/30; patient presents for follow-up. She completed Keflex with resolution of her symptoms to the right lower extremity. She no longer has increased warmth or erythema. She continues her infusions for her squamous cell carcinoma lesions. We continue to wrap her in 3 layer compression with silver alginate. She currently denies signs of infection. Patient History Information obtained from Patient. Family History Unknown History. Social History Never smoker, Marital Status - Single, Alcohol Use - Never, Drug Use - No History, Caffeine Use - Never. Medical History Eyes Denies history of Cataracts, Optic Neuritis Ear/Nose/Mouth/Throat Denies history of Chronic sinus problems/congestion, Middle ear  problems Hematologic/Lymphatic Patient has history of Anemia Denies history of Hemophilia, Human Immunodeficiency Virus, Lymphedema, Sickle Cell Disease Respiratory Denies history of Aspiration, Asthma, Chronic Obstructive Pulmonary Disease (COPD), Pneumothorax, Sleep Apnea, Tuberculosis Cardiovascular Patient has history of Arrhythmia - Atrial Flutter, A fibb, Congestive Heart Failure, Hypertension Denies history of Angina, Coronary Artery Disease, Deep Vein Thrombosis, Hypotension, Myocardial Infarction, Peripheral Arterial Disease, Peripheral Venous Disease, Phlebitis, Vasculitis Gastrointestinal Patient has history of Colitis Denies history of Cirrhosis , Crohnoos, Hepatitis A, Hepatitis B, Hepatitis C Endocrine Denies history of Type I Diabetes, Type II Diabetes Genitourinary Denies history of End Stage Renal Disease Immunological Denies history of Lupus Erythematosus, Raynaudoos, Scleroderma Integumentary (Skin) Denies history of History of Burn Musculoskeletal Patient has history of Osteoarthritis Denies history of Gout, Rheumatoid Arthritis, Osteomyelitis Neurologic Denies history of  Dementia, Neuropathy, Quadriplegia, Paraplegia, Seizure Disorder Oncologic Denies history of Received Chemotherapy, Received Radiation Hospitalization/Surgery History - removal of rod left leg. 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 Vitals Time Taken: 1:20 PM, Height: 65 in, Weight: 163 lbs, BMI: 27.1, Temperature: 97.6 F, Pulse: 75 bpm, Respiratory Rate: 20 breaths/min, Blood Pressure: 118/71 mmHg. Cardiovascular 2+ dorsalis pedis/posterior tibialis pulses. Psychiatric pleasant and cooperative. General Notes: Right lower extremity: Scattered areas limited to  skin breakdown. 2 areas suspicious for malignancy. 1 of these has been confirmed squamous cell carcinoma. Left lower extremity: Area of skin breakdown to the posterior aspect. Granulation tissue throughout. She also has another suspicious lesion for malignancy to the lateral aspect. No surrounding signs of infection T any of the open areas. o Integumentary (Hair, Skin) Wound #7RR status is Open. Original cause of wound was Blister. The date acquired was: 04/20/2020. The wound has been in treatment 53 weeks. The wound is located on the Left,Circumferential Lower Leg. The wound measures 0.2cm length x 0.2cm width x 0.1cm depth; 0.031cm^2 area and 0.003cm^3 volume. There is Fat Layer (Subcutaneous Tissue) exposed. There is no tunneling noted. There is a medium amount of serosanguineous drainage noted. The wound margin is distinct with the outline attached to the wound base. There is large (67-100%) red, pink granulation within the wound bed. There is no necrotic tissue within the wound bed. Assessment Active Problems ICD-10 Squamous cell carcinoma of skin, unspecified Non-pressure chronic ulcer of other part of right lower leg with fat layer exposed Non-pressure chronic ulcer of other part of left lower leg with fat layer exposed Lymphedema, not elsewhere classified Chronic venous hypertension (idiopathic) with ulcer of bilateral lower extremity Patient's left lower extremity wound has improved in size and appearance since last clinic visit. I recommended continuing silver alginate here. She has a few very small scattered open areas to the right lower extremity and I recommended continuing compression therapy here. Unfortunately she has multiple open areas that are suspicious for malignancy to her legs bilaterally. I recommended she follow-up with dermatology. We will call the office to help facilitate a follow-up. She continues infusions with oncology. Procedures Wound #7RR Pre-procedure  diagnosis of Wound #7RR is a Malignant Wound located on the Left,Circumferential Lower Leg . There was a Three Layer Compression Therapy Procedure by Deon Pilling, RN. Post procedure Diagnosis Wound #7RR: Same as Pre-Procedure There was a Three Layer Compression Therapy Procedure by Deon Pilling, RN. Post procedure Diagnosis Wound #: Same as Pre-Procedure Plan Follow-up Appointments: Return Appointment in 1 week. - Dr. Heber [place] and [place], Room 8 06/27/2021 130pm Thursday Dr. Heber [place] and [place], Room 8 07/02/2021 130pm Tuesday Other: - ***Need to call your dermatologist for him to look at your legs as you have more areas that are concerning.*** ***Nurse will call your dermatologist to update them.*** Bathing/ Shower/ Hygiene: May shower with protection but do not get wound dressing(s) wet. - Use cast protector Edema Control - Lymphedema / SCD / Other: Lymphedema Pumps. Use Lymphedema pumps on leg(s) 2-3 times a day for 45-60 minutes. If wearing any wraps or hose, do not remove them. Continue exercising as instructed. - use one hour daily. Elevate legs to the level of the heart or above for 30 minutes daily and/or when sitting, a frequency of: - elevate the legs throughout the day heart level if possible. Avoid standing for long periods  of time. Exercise regularly Additional Orders / Instructions: Follow Nutritious Diet Non Wound Condition: Other Non Wound Condition Orders/Instructions: - lotion, calcium alginate Ag, abd pad, 3 layer compression to right leg. WOUND #7RR: - Lower Leg Wound Laterality: Left, Circumferential Cleanser: Soap and Water 1 x Per Week/30 Days Discharge Instructions: May shower and wash wound with dial antibacterial soap and water prior to dressing change. Cleanser: Wound Cleanser 1 x Per Week/30 Days Discharge Instructions: Cleanse the wound with wound cleanser prior to applying a clean dressing using gauze sponges, not tissue or cotton balls. Peri-Wound Care: Sween  Lotion (Moisturizing lotion) 1 x Per Week/30 Days Discharge Instructions: Apply moisturizing lotion as directed Prim Dressing: KerraCel Ag Gelling Fiber Dressing, 4x5 in (silver alginate) 1 x Per Week/30 Days ary Discharge Instructions: Apply silver alginate to wound bed as instructed Secondary Dressing: ABD Pad, 8x10 1 x Per Week/30 Days Discharge Instructions: Apply over primary dressing as directed. Secondary Dressing: Woven Gauze Sponge, Non-Sterile 4x4 in 1 x Per Week/30 Days Discharge Instructions: Apply over primary dressing as directed. Com pression Wrap: ThreePress (3 layer compression wrap) 1 x Per Week/30 Days Discharge Instructions: Apply three layer compression as directed. 1. Silver alginate 2. 3 layer compression to lower extremities bilaterally 3. Follow-up with dermatology 4. Follow-up in 1 week Electronic Signature(s) Signed: 06/18/2021 3:01:14 PM By: Kalman Shan DO Entered By: Kalman Shan on 06/18/2021 15:00:19 -------------------------------------------------------------------------------- HxROS Details Patient Name: Date of Service: [name], [name]. 06/18/2021 1:30 PM Medical Record Number: [id number] Patient Account Number: [id number] Date of Birth/Sex: Treating RN: [birth year] (88 y.o. [name] Primary Care Provider: [name] Other Clinician: Referring Provider: Treating Provider/Extender: [name] in Treatment: 66 Information Obtained From Patient Eyes Medical History: Negative for: Cataracts; Optic Neuritis Ear/Nose/Mouth/Throat Medical History: Negative for: Chronic sinus problems/congestion; Middle ear problems Hematologic/Lymphatic Medical History: Positive for: Anemia Negative for: Hemophilia; Human Immunodeficiency Virus; Lymphedema; Sickle Cell Disease Respiratory Medical History: Negative for: Aspiration; Asthma; Chronic Obstructive Pulmonary Disease (COPD); Pneumothorax; Sleep  Apnea; Tuberculosis Cardiovascular Medical History: Positive for: Arrhythmia - Atrial Flutter, A fibb; Congestive Heart Failure; Hypertension Negative for: Angina; Coronary Artery Disease; Deep Vein Thrombosis; Hypotension; Myocardial Infarction; Peripheral Arterial Disease; Peripheral Venous Disease; Phlebitis; Vasculitis Past Medical History Notes: hyperlipidemia Gastrointestinal Medical History: Positive for: Colitis Negative for: Cirrhosis ; Crohns; Hepatitis A; Hepatitis B; Hepatitis C Endocrine Medical History: Negative for: Type I Diabetes; Type II Diabetes Past Medical History Notes: hypothyroidism Genitourinary Medical History: Negative for: End Stage Renal Disease Immunological Medical History: Negative for: Lupus Erythematosus; Raynauds; Scleroderma Integumentary (Skin) Medical History: Negative for: History of Burn Musculoskeletal Medical History: Positive for: Osteoarthritis Negative for: Gout; Rheumatoid Arthritis; Osteomyelitis Neurologic Medical History: Negative for: Dementia; Neuropathy; Quadriplegia; Paraplegia; Seizure Disorder Past Medical History Notes: lumbar spindylolysis Oncologic Medical History: Negative for: Received Chemotherapy; Received Radiation Past Medical History Notes: BLE squamous ceel carcionoma Immunizations Pneumococcal Vaccine: Received Pneumococcal Vaccination: Yes Received Pneumococcal Vaccination On or After 60th Birthday: No Implantable Devices None Hospitalization / Surgery History Type of Hospitalization/Surgery removal of rod left leg Family and Social History Unknown History: Yes; Never smoker; Marital Status - Single; Alcohol Use: Never; Drug Use: No History; Caffeine Use: Never; Financial Concerns: No; Food, Clothing or Shelter Needs: No; Support System Lacking: No; Transportation Concerns: No Electronic Signature(s) Signed: 06/18/2021 3:01:14 PM By: Kalman Shan DO Signed: 06/18/2021 4:42:51 PM By: Deon Pilling RN, BSN Entered By: Kalman Shan on 06/18/2021 14:57:16 -------------------------------------------------------------------------------- SuperBill Details Patient Name: Date  of Service: [name], [name] 06/18/2021 Medical Record Number: [id number] Patient Account Number: [id number] Date of Birth/Sex: Treating RN: July 04, 1925 (88 y.o. [name], [name] Primary Care Provider: [name] Other Clinician: Referring Provider: Treating Provider/Extender: [name] in Treatment: 41 Diagnosis Coding ICD-10 Codes Code Description C44.92 Squamous cell carcinoma of skin, unspecified L97.812 Non-pressure chronic ulcer of other part of right lower leg with fat layer exposed L97.822 Non-pressure chronic ulcer of other part of left lower leg with fat layer exposed I89.0 Lymphedema, not elsewhere classified I87.313 Chronic venous hypertension (idiopathic) with ulcer of bilateral lower extremity Facility Procedures CPT4: Code [id number] Description: 81 BILATERAL: Application of multi-layer venous compression system; leg (below knee), including ankle and t. Modifier: Quantity: 1 Physician Procedures : CPT4 Code Description Modifier [id number] - WC PHYS LEVEL 3 - EST PT ICD-10 Diagnosis Description C44.92 Squamous cell carcinoma of skin, unspecified L97.812 Non-pressure chronic ulcer of other part of right lower leg with fat layer exposed L97.822  Non-pressure chronic ulcer of other part of left lower leg with fat layer exposed I89.0 Lymphedema, not elsewhere classified Quantity: 1 Electronic Signature(s) Signed: 06/18/2021 3:01:14 PM By: Kalman Shan DO Entered By: Kalman Shan on 06/18/2021 15:00:46

## 2021-06-19 ENCOUNTER — Inpatient Hospital Stay (HOSPITAL_BASED_OUTPATIENT_CLINIC_OR_DEPARTMENT_OTHER): Payer: Medicare Other | Admitting: Oncology

## 2021-06-19 ENCOUNTER — Inpatient Hospital Stay: Payer: Medicare Other

## 2021-06-19 ENCOUNTER — Other Ambulatory Visit: Payer: Self-pay

## 2021-06-19 VITALS — BP 116/55 | HR 78 | Temp 98.2°F | Resp 16 | Wt 152.8 lb

## 2021-06-19 DIAGNOSIS — C44721 Squamous cell carcinoma of skin of unspecified lower limb, including hip: Secondary | ICD-10-CM | POA: Diagnosis not present

## 2021-06-19 DIAGNOSIS — C7652 Malignant neoplasm of left lower limb: Secondary | ICD-10-CM | POA: Diagnosis not present

## 2021-06-19 DIAGNOSIS — Z5112 Encounter for antineoplastic immunotherapy: Secondary | ICD-10-CM | POA: Diagnosis not present

## 2021-06-19 DIAGNOSIS — E039 Hypothyroidism, unspecified: Secondary | ICD-10-CM

## 2021-06-19 DIAGNOSIS — C4490 Unspecified malignant neoplasm of skin, unspecified: Secondary | ICD-10-CM

## 2021-06-19 DIAGNOSIS — Z79899 Other long term (current) drug therapy: Secondary | ICD-10-CM | POA: Diagnosis not present

## 2021-06-19 LAB — CMP (CANCER CENTER ONLY)
ALT: 12 U/L (ref 0–44)
AST: 27 U/L (ref 15–41)
Albumin: 3.3 g/dL — ABNORMAL LOW (ref 3.5–5.0)
Alkaline Phosphatase: 55 U/L (ref 38–126)
Anion gap: 6 (ref 5–15)
BUN: 16 mg/dL (ref 8–23)
CO2: 32 mmol/L (ref 22–32)
Calcium: 9.4 mg/dL (ref 8.9–10.3)
Chloride: 101 mmol/L (ref 98–111)
Creatinine: 1.21 mg/dL — ABNORMAL HIGH (ref 0.44–1.00)
GFR, Estimated: 42 mL/min — ABNORMAL LOW (ref >60)
Glucose, Bld: 153 mg/dL — ABNORMAL HIGH (ref 70–99)
Potassium: 3.8 mmol/L (ref 3.5–5.1)
Sodium: 139 mmol/L (ref 135–145)
Total Bilirubin: 0.7 mg/dL (ref 0.3–1.2)
Total Protein: 7 g/dL (ref 6.5–8.1)

## 2021-06-19 LAB — CBC WITH DIFFERENTIAL (CANCER CENTER ONLY)
Abs Immature Granulocytes: 0.01 10*3/uL (ref 0.00–0.07)
Basophils Absolute: 0.1 10*3/uL (ref 0.0–0.1)
Basophils Relative: 1 %
Eosinophils Absolute: 0.2 10*3/uL (ref 0.0–0.5)
Eosinophils Relative: 3 %
HCT: 32 % — ABNORMAL LOW (ref 36.0–46.0)
Hemoglobin: 10.5 g/dL — ABNORMAL LOW (ref 12.0–15.0)
Immature Granulocytes: 0 %
Lymphocytes Relative: 15 %
Lymphs Abs: 0.8 10*3/uL (ref 0.7–4.0)
MCH: 34.9 pg — ABNORMAL HIGH (ref 26.0–34.0)
MCHC: 32.8 g/dL (ref 30.0–36.0)
MCV: 106.3 fL — ABNORMAL HIGH (ref 80.0–100.0)
Monocytes Absolute: 0.7 10*3/uL (ref 0.1–1.0)
Monocytes Relative: 13 %
Neutro Abs: 3.9 10*3/uL (ref 1.7–7.7)
Neutrophils Relative %: 68 %
Platelet Count: 303 10*3/uL (ref 150–400)
RBC: 3.01 MIL/uL — ABNORMAL LOW (ref 3.87–5.11)
RDW: 13.3 % (ref 11.5–15.5)
WBC Count: 5.7 10*3/uL (ref 4.0–10.5)
nRBC: 0 % (ref 0.0–0.2)

## 2021-06-19 LAB — TSH: TSH: 0.684 u[IU]/mL (ref 0.350–4.500)

## 2021-06-19 MED ORDER — SODIUM CHLORIDE 0.9 % IV SOLN: Freq: Once | INTRAVENOUS | Status: AC

## 2021-06-19 MED ORDER — SODIUM CHLORIDE 0.9 % IV SOLN
350.0000 mg | Freq: Once | INTRAVENOUS | Status: AC
  Administered 2021-06-19: 350 mg via INTRAVENOUS
  Filled 2021-06-19: qty 7

## 2021-06-19 NOTE — Progress Notes (Signed)
Hematology and Oncology Follow Up Visit  [name] [id number] Nov 19, 1925 88 y.o. 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  She has recurrent recurrent disease noted in March 2023.   Prior Therapy:  She received Libtayo for 5 cycles concluded in September 2022.  Current therapy:  Libtayo 350 mg total dose restarted on March 26, 2021.  He is here for cycle 5 of therapy.  Interim History: [name] presents today for a follow-up visit.  Since last visit, she reports no major changes in her health.  She denies any increased pain in her lower extremities and continues require wound care on a weekly basis.  She was prescribed a course of Keflex which she has finished.  She denies any complication related to Libtayo.  She denies nausea, fatigue or changes in her bowel habits.  He denies any shortness of breath or difficulty breathing.     Medications: Updated on review. Current Outpatient Medications  Medication Sig Dispense Refill   acetaminophen (TYLENOL) 500 MG tablet Take 500 mg by mouth daily as needed for mild pain.      apixaban (ELIQUIS) 5 MG TABS tablet TAKE 1 TABLET BY MOUTH TWICE DAILY 60 tablet 5   B Complex-C (B-COMPLEX WITH VITAMIN C) tablet Take 1 tablet by mouth daily.     busPIRone (BUSPAR) 15 MG tablet TAKE 1 TABLET BY MOUTH TWICE DAILY 180 tablet 3   Cholecalciferol (VITAMIN D3) 1000 UNITS tablet Take 1,000 Units by mouth daily.     colestipol (COLESTID) 1 g tablet Take 1 tablet (1 g total) by mouth in the morning. 60 tablet 12   diltiazem (CARDIZEM CD) 120 MG 24 hr capsule TAKE 1 CAPSULE BY MOUTH ONCE DAILY 30 capsule 11   doxycycline (VIBRA-TABS) 100 MG tablet TAKE 1 TABLET BY MOUTH ONCE DAILY 30 tablet 11   famotidine (PEPCID) 20 MG tablet Take 20 mg by mouth daily.     furosemide (LASIX) 40 MG tablet Take 1 tablet (40 mg  total) by mouth 2 (two) times daily. 60 tablet 5   gabapentin (NEURONTIN) 100 MG capsule TAKE 1 CAPSULE BY MOUTH EVERY NIGHT AT BEDTIME *DO NOT CRUSH OR CHEW* **NOTE NEW INSTRUCTIONS* 30 capsule 5   hydrocortisone 2.5 % lotion Apply topically 3 (three) times daily. 240 mL 2   levothyroxine (SYNTHROID) 25 MCG tablet TAKE 1 TABLET BY MOUTH ONCE DAILY FOR THYROID *TAKE ON AN EMPTY STOMACH* 30 tablet 11   linaclotide (LINZESS) 290 MCG CAPS capsule TAKE 1 TABLET BY MOUTH ONCE DAILY *TAKE 30 MINUTES PRIOR TO FIRST MEAL* *TAKE ON AN EMPTY STOMACH* *DO NOT CRUSH OR CHEW* 30 capsule 5   loperamide (IMODIUM) 2 MG capsule Take 1 capsule (2 mg total) by mouth as needed for diarrhea or loose stools. Take 1 tab with first loose stool then one tab after each loose stool for maximum dose of 8 tabs daily. Pt to call the office if need for greater then 8 tabs a day 60 capsule 0   methylPREDNISolone (MEDROL DOSEPAK) 4 MG TBPK tablet As directed 21 tablet 0   metoprolol succinate (TOPROL-XL) 50 MG 24 hr tablet TAKE 1/2 TABLET = 25 MG BY MOUTH TWICE DAILY. TAKE WITH OR IMMEDIATELY FOLLOWING A MEAL 30 tablet 10   Multiple Vitamins-Minerals (MULTIVITAMIN WITH MINERALS) tablet Take 1 tablet by mouth daily.     polyethylene glycol (MIRALAX)  17 g packet Take 17 g by mouth daily. 30 each 0   potassium chloride SA (KLOR-CON M) 20 MEQ tablet TAKE 1 TABLET BY MOUTH ONCE DAILY  ALONG W/ FUROSEMIDE AS NEED *TAKE WITH FOOD* *DO NOT CRUSH OR CHEW* *MAY DISSOLVE* 30 tablet 11   potassium chloride SA (KLOR-CON M) 20 MEQ tablet Take 1 tablet (20 mEq total) by mouth daily. Take along w/ Furosemide as need 30 tablet 3   prochlorperazine (COMPAZINE) 5 MG tablet Take 1 tablet (5 mg total) by mouth every 8 (eight) hours as needed for nausea or vomiting. 20 tablet 1   temazepam (RESTORIL) 15 MG capsule TAKE 1 CAPSULE BY MOUTH EVERY NIGHT AT BEDTIME AS NEEDED FOR SLEEP 30 capsule 3   No current facility-administered medications for this visit.      Allergies:  Allergies  Allergen Reactions   Zosyn [Piperacillin Sod-Tazobactam So] Hives, Itching, Rash and Other (See Comments)    Morbilliform eruptions with itching- looks like Measles   Atorvastatin     myalgias   Atenolol Nausea And Vomiting   Clarithromycin Nausea And Vomiting   Codeine Sulfate Nausea Only   Levaquin [Levofloxacin] Nausea Only   Macrodantin Other (See Comments)    UNSPECIFIED    Oxycodone-Acetaminophen Nausea And Vomiting      Physical Exam:     ECOG: 2   General appearance: Alert, awake without any distress. Head: Atraumatic without abnormalities Oropharynx: Without any thrush or ulcers. Eyes: No scleral icterus. Lymph nodes: No lymphadenopathy noted in the cervical, supraclavicular, or axillary nodes Heart:regular rate and rhythm, without any murmurs or gallops.   Lung: Clear to auscultation without any rhonchi, wheezes or dullness to percussion. Abdomin: Soft, nontender without any shifting dullness or ascites. Musculoskeletal: No clubbing or cyanosis. Neurological: No motor or sensory deficits. Skin: No changes noted in her lower extremities.  Erythema noted to around the wrappings.  Protrusion is noted on her right arm unchanged from previous examination.     Lab Results: Lab Results  Component Value Date   WBC 7.1 05/29/2021   HGB 11.7 (L) 05/29/2021   HCT 36.1 05/29/2021   MCV 105.6 (H) 05/29/2021   PLT 326 05/29/2021     Chemistry      Component Value Date/Time   NA 138 05/29/2021 1202   NA 134 08/11/2018 1425   NA 137 07/28/2016 1334   K 4.0 05/29/2021 1202   K 3.8 07/28/2016 1334   CL 99 05/29/2021 1202   CO2 32 05/29/2021 1202   CO2 28 07/28/2016 1334   BUN 20 05/29/2021 1202   BUN 20 08/11/2018 1425   BUN 9.5 07/28/2016 1334   CREATININE 1.36 (H) 05/29/2021 1202   CREATININE 1.0 07/28/2016 1334      Component Value Date/Time   CALCIUM 9.6 05/29/2021 1202   CALCIUM 9.6 07/28/2016 1334   ALKPHOS 61  05/29/2021 1202   ALKPHOS 96 07/28/2016 1334   AST 23 05/29/2021 1202   AST 21 07/28/2016 1334   ALT 10 05/29/2021 1202   ALT 24 07/28/2016 1334   BILITOT 0.7 05/29/2021 1202   BILITOT 1.21 (H) 07/28/2016 1334         Impression and Plan:  88 year old with:  1.  Lower extremity squamous cell carcinoma diagnosed in June 2022 with recurrent disease in 2023.    She is currently on Libtayo without any major complications.  Risks and benefits of continuing this treatment long-term were discussed.  Autoimmune complications, GI toxicity and dermatological toxicity  were reviewed.  It remains unclear to me whether she is responding to this treatment at this time.  Her lower extremities lesions are difficult to assess especially in the setting of nonhealing wound ulcers are currently wrapped.  I recommended continuing Libtayo for the time being till her next assessment with Dr. Ronnald Ramp and will determine whether she is responding at that time based on his assessment.   She is agreeable to continue with this treatment at this time.  Her daughter was present via phone and aware of the plan and opportunity to ask questions.    2.  Breast cancer: She has no evidence of relapsed disease at this time.  3.  Nonhealing wounds in the lower extremities: She is status post a course of Keflex without any evidence of infection at this time.      4.  Follow-up: In 3 weeks for repeat evaluation.      30 minutes were dedicated to this visit.  Time was spent on reviewing laboratory data, disease status update and outlining future plan of care as treatment.   Zola Button, MD 5/31/202311:32 AM

## 2021-06-19 NOTE — Patient Instructions (Signed)
[facility]  Discharge Instructions: Thank you for choosing [facility] to provide your oncology and hematology care.   If you have a lab appointment with the Cancer Center, please go directly to the Cancer Center and check in at the registration area.   Wear comfortable clothing and clothing appropriate for easy access to any Portacath or PICC line.   We strive to give you quality time with your provider. You may need to reschedule your appointment if you arrive late (15 or more minutes).  Arriving late affects you and other patients whose appointments are after yours.  Also, if you miss three or more appointments without notifying the office, you may be dismissed from the clinic at the provider's discretion.      For prescription refill requests, have your pharmacy contact our office and allow 72 hours for refills to be completed.    Today you received the following chemotherapy and/or immunotherapy agents :  Libtayo      To help prevent nausea and vomiting after your treatment, we encourage you to take your nausea medication as directed.  BELOW ARE SYMPTOMS THAT SHOULD BE REPORTED IMMEDIATELY: *FEVER GREATER THAN 100.4 F (38 C) OR HIGHER *CHILLS OR SWEATING *NAUSEA AND VOMITING THAT IS NOT CONTROLLED WITH YOUR NAUSEA MEDICATION *UNUSUAL SHORTNESS OF BREATH *UNUSUAL BRUISING OR BLEEDING *URINARY PROBLEMS (pain or burning when urinating, or frequent urination) *BOWEL PROBLEMS (unusual diarrhea, constipation, pain near the anus) TENDERNESS IN MOUTH AND THROAT WITH OR WITHOUT PRESENCE OF ULCERS (sore throat, sores in mouth, or a toothache) UNUSUAL RASH, SWELLING OR PAIN  UNUSUAL VAGINAL DISCHARGE OR ITCHING   Items with * indicate a potential emergency and should be followed up as soon as possible or go to the Emergency Department if any problems should occur.  Please show the CHEMOTHERAPY ALERT CARD or IMMUNOTHERAPY ALERT CARD at check-in to  the Emergency Department and triage nurse.  Should you have questions after your visit or need to cancel or reschedule your appointment, please contact [facility]  Dept: [phone]  and follow the prompts.  Office hours are 8:00 a.m. to 4:30 p.m. Monday - Friday. Please note that voicemails left after 4:00 p.m. may not be returned until the following business day.  We are closed weekends and major holidays. You have access to a nurse at all times for urgent questions. Please call the main number to the clinic Dept: [phone] and follow the prompts.   For any non-urgent questions, you may also contact your provider using MyChart. We now offer e-Visits for anyone 18 and older to request care online for non-urgent symptoms. For details visit [email or web address].   Also download the MyChart app! Go to the app store, search "MyChart", open the app, select [facility], and log in with your MyChart username and password.  Due to Covid, a mask is required upon entering the hospital/clinic. If you do not have a mask, one will be given to you upon arrival. For doctor visits, patients may have 1 support person aged 18 or older with them. For treatment visits, patients cannot have anyone with them due to current Covid guidelines and our immunocompromised population.   

## 2021-06-25 ENCOUNTER — Ambulatory Visit (HOSPITAL_BASED_OUTPATIENT_CLINIC_OR_DEPARTMENT_OTHER): Payer: Medicare Other | Admitting: Internal Medicine

## 2021-06-25 DIAGNOSIS — C44722 Squamous cell carcinoma of skin of right lower limb, including hip: Secondary | ICD-10-CM | POA: Diagnosis not present

## 2021-06-25 DIAGNOSIS — L308 Other specified dermatitis: Secondary | ICD-10-CM | POA: Diagnosis not present

## 2021-06-25 DIAGNOSIS — I872 Venous insufficiency (chronic) (peripheral): Secondary | ICD-10-CM | POA: Diagnosis not present

## 2021-06-25 DIAGNOSIS — Z85828 Personal history of other malignant neoplasm of skin: Secondary | ICD-10-CM | POA: Diagnosis not present

## 2021-06-25 DIAGNOSIS — I8312 Varicose veins of left lower extremity with inflammation: Secondary | ICD-10-CM | POA: Diagnosis not present

## 2021-06-25 DIAGNOSIS — C44622 Squamous cell carcinoma of skin of right upper limb, including shoulder: Secondary | ICD-10-CM | POA: Diagnosis not present

## 2021-06-25 DIAGNOSIS — I8311 Varicose veins of right lower extremity with inflammation: Secondary | ICD-10-CM | POA: Diagnosis not present

## 2021-06-26 ENCOUNTER — Telehealth: Payer: Self-pay | Admitting: Oncology

## 2021-06-26 NOTE — Telephone Encounter (Signed)
Called patient regarding upcoming June and July appointment, left a voicemail.

## 2021-06-27 ENCOUNTER — Encounter (HOSPITAL_BASED_OUTPATIENT_CLINIC_OR_DEPARTMENT_OTHER): Payer: Medicare Other | Attending: Internal Medicine | Admitting: Internal Medicine

## 2021-06-27 DIAGNOSIS — L97822 Non-pressure chronic ulcer of other part of left lower leg with fat layer exposed: Secondary | ICD-10-CM | POA: Diagnosis not present

## 2021-06-27 DIAGNOSIS — I509 Heart failure, unspecified: Secondary | ICD-10-CM | POA: Diagnosis not present

## 2021-06-27 DIAGNOSIS — I890 Lymphedema, not elsewhere classified: Secondary | ICD-10-CM | POA: Diagnosis not present

## 2021-06-27 DIAGNOSIS — I87313 Chronic venous hypertension (idiopathic) with ulcer of bilateral lower extremity: Secondary | ICD-10-CM | POA: Diagnosis not present

## 2021-06-27 DIAGNOSIS — M1990 Unspecified osteoarthritis, unspecified site: Secondary | ICD-10-CM | POA: Insufficient documentation

## 2021-06-27 DIAGNOSIS — Z79899 Other long term (current) drug therapy: Secondary | ICD-10-CM | POA: Diagnosis not present

## 2021-06-27 DIAGNOSIS — C4492 Squamous cell carcinoma of skin, unspecified: Secondary | ICD-10-CM | POA: Diagnosis not present

## 2021-06-27 DIAGNOSIS — L97812 Non-pressure chronic ulcer of other part of right lower leg with fat layer exposed: Secondary | ICD-10-CM | POA: Diagnosis not present

## 2021-06-27 DIAGNOSIS — I110 Hypertensive heart disease with heart failure: Secondary | ICD-10-CM | POA: Insufficient documentation

## 2021-06-27 DIAGNOSIS — C4482 Squamous cell carcinoma of overlapping sites of skin: Secondary | ICD-10-CM | POA: Diagnosis not present

## 2021-06-27 NOTE — Progress Notes (Signed)
[name] ([id number]) , Visit Report for 06/27/2021 HPI Details Patient Name: Date of Service: [name], [place]. 06/27/2021 1:30 PM Medical Record Number: [id number] Patient Account Number: [id number] Date of Birth/Sex: Treating RN: [birth year] (88 y.o. [name]: [name] Other Clinician: Referring Bell: Treating Bell/Extender: Thayer Headings in Treatment: 53 History of Present Illness Location: left leg HPI Description: Admission 5/23 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 She states that the wound on the right has been present for a year. The 1 on the left has opened up 1 month ago. She is followed with oncology for this issue as she had biopsies that showed squamous cell carcinoma. She is also seeing radiation oncology for treatment options. She presents today because she would like for her wounds to be healed by [place]. She currently denies signs of infection. 6/1; patient presents for 1 week follow-up. She states she has tolerated the leg wraps well. She states these do not bother her and is happy to continue with them. She is scheduled to see her oncologist today to go over treatment options for the bilateral lower extremity squamous cell carcinoma. Radiation is currently not a recommended option. Patient states she overall feels well. 6/22; patient presents for 3-week follow-up. She has tolerated the wraps well until her last wrap where she states they were uncomfortable. She attributes this to the home health nurse. She denies signs of infection. She has started her first treatment of antibody infusions for her Bilateral lower extremity squamous cell carcinoma. She has no  complaints today. 7/21; patient presents for 1 month follow-up. Unfortunately she has not had good experience with her wrap changes with home health. She would like to do her own dressing changes. She continues to do her antibody infusions. She denies signs of infection. 7/28; patient presents for 1 week follow-up. At last clinic visit she was switched to daily dressing changes due to issues with the wrap and home health placing them. Unfortunately she has developed weeping to her legs bilaterally. She would like to be placed in wraps today. She would also like to follow with [place] weekly for wrap changes instead of having home health change them. She denies signs of infection. 8/4; patient presents for 1 week follow-up. She has tolerated the Kerlix/Coban wraps well. She no longer has weeping to her legs. She took the wrap off 1 day before coming in to be able to take a shower. She has no issues or complaints today. She denies signs of infection. 8/18; patient presents for follow-up. Patient has tolerated the wraps well. She brought her Velcro compression wraps today. She has no issues or complaints today. She had her chemotherapy infusion yesterday without issues. She denies signs of infection. 8/25; patient presents for follow-up. She used her juxta lite compressions for the past week. It is unclear if she is able to put these on correctly since she states she has a hard time getting them to look right. She reports 2 open wounds. She currently denies signs of infection. 9/1; patient presents for 1 week follow-up. She has 1 open wound. She tolerated the compression wraps well. She currently denies signs of infection. 9/8; patient presents for 1 week follow-up. She has 2 open wounds  1 on each leg. She has tolerated the compression wrap well. She currently denies signs of infection. 9/15; patient presents for 1 week follow-up. She now has 3 wounds. 2 on the left and 1 on the right. She continues to  tolerate the compression wrap well. She currently denies signs of infection. She has obtained furosemide by her primary care physician and would like to discuss when to take this. 9/22; patient presents for 1 week follow-up. She has scattered wounds on her lower extremities bilaterally. She did take furosemide twice in the past week. She does not recall having to urinate more frequently. She tolerated the 3 layer compression well. She denies signs of infection. 10/13; patient has not been here recently because of [place]. Apparently the facility was only putting gauze on her legs. This is a patient I do not normally see. 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 Ronnald Ramp at [facility] dermatology. She is quite convincing that she did not have radiation to her lower legs. It is likely she also has significant chronic venous insufficiency stasis dermatitis. We have been using silver alginate under kerlix Coban. She has obvious open areas medially on the right and areas on the left. She also has areas of extensive dry flaking adherent areas on the right and to a lesser extent on the left anterior. Nodular areas on the left lateral lower leg left posterior calf and right mid calf medially. 10/21; patient presents for follow-up. She has no issues or complaints today. She has tolerated the 3 layer compression well bilaterally. She denies signs of infection. 10/27; patient presents for follow-up. She continues to tolerate the 3 layer compression wrap well. She denies signs of infection. 11/30; patient presents for follow-up. She has no issues or complaints today. 11/10; patient arrived in clinic today for nurse visit accompanied by her daughter from [place]. The daughter had multiple questions so we turned this into doctors visit. Apparently after the last time I saw this woman in October she went to see Dr. Ronnald Ramp but he did not take the  wraps off. She previously was treated with Cemiplimab for her squamous cell carcinoma. She did not receive radiation to her legs. I had wanted Dr. Ronnald Ramp to look at this because of the exceptionally damaged skin on her lower legs bilaterally. She has odd looking wounds on the left medial lower leg also extending posteriorly which we have not made a lot of progress on. But she also has a raised hyperkeratotic nodules on the right anterior tibial area plaques on the left medial lower thigh. These are not areas that are under compression. We have been using silver alginate on any open areas Liberal TCA under 3 layer compression. 11/17; patient presents for follow-up. She had her wraps taken off yesterday for her dermatology appointment. She reports excessive weeping to her legs bilaterally after the wraps were taken off. She currently denies signs of infection. 12/12/2020 upon evaluation today patient appears to be doing about the same in regard to her wounds. She was actually started on Cipro after having biopsies apparently at her dermatology clinic she does not have the results back from the actual biopsy but the culture did return and apparently they placed her on the Cipro she started that just this morning. Other than that her legs appear to be doing okay at this time. 12/1; patient presents for follow-up. She has no issues or complaints today.  She has started Lasix 40 mg daily to help with her bilateral leg swelling. 12/8; patient presents for follow-up. She has no issues or complaints today. 12/15; patient presents for 1 week follow-up. She has no issues or complaints today. 12/22; patient presents for follow-up. She has no issues or complaints today. 1/5; patient presents for follow-up. She has no issues or complaints today. She has tolerated the compression wraps well. 1/12; patient presents for follow-up. She is tolerated the compression wrap well and has no issues or complaints today. She  denies signs of infection. 1/19; patient presents for follow-up. She took the compression wraps off 1 to 2 days ago to take a shower and did not have compression wraps replaced. She reports increased blistering throughout her legs bilaterally. She currently denies signs of infection. 1/26; patient presents for follow-up. She has no issues or complaints today. She tolerated the compression wraps well. She has not heard from oncology to schedule an appointment. 2/2; patient presents for follow-up. She has no issues or complaints today. She continues to tolerate the compression wrap well. 2/16; patient presents for follow-up. She has no issues or complaints today. 2/23; patient presents for follow-up. She has no issues or complaints today. Tomorrow she has an appointment with oncology to look at the suspicious lesion on her right lower extremity. She currently denies signs of infection. 3/2; patient presents for follow-up. She has been using her juxta light compression to the right lower extremity however there has been increased swelling and opening of wounds throughout the legs with weeping. She currently denies signs of infection. She had no issues with the compression wrap to the left lower extremity. 3/16; patient presents for follow-up. She has no issues or complaints today. She states that she has been restarted on infusions for her squamous cell carcinoma of her legs. She has also been increased on Lasix to 40 mg twice daily. She has noticed a decrease in swelling to her legs. 3/23; patient presents for follow-up. She starts her second infusion next week for her squamous cell carcinoma lesions to her legs. She reports no issues with the compression wraps. 3/30; patient presents for follow-up. She has no issues or complaints today. 4/6; patient presents for follow-up. Her left lower extremity wounds have healed. She has her juxta light compression with her today. She has no issues or complaints  today. 4/13; this is a patient we actually discharged to juxta lite stockings on the left leg the last time she was here. She came in on Monday for [place] to look at the leg and a nurse visit. She had massive swelling and numerous blisters and wound reopening. We put her back in 3 layer compression although I did not generate a note. Silver alginate on the wounded areas. We have also been doing the same on the right. In the meantime she is obtained her external compression pumps that were previously ordered and she started to use them. She has skin cancers that are obvious on the right leg x2 her appointment with Dr. Jarome Matin of dermatology is on May 5.. She is also receiving weekly chemotherapy for recurrent presumably squamous cell carcinomas apparently has had 2 treatments 4/20; patient presents for follow-up. She has her new juxta lite compressions today. She continues to have open wounds to the left lower extremity. She has follow-up with dermatology in 2 weeks. She continues to have IV infusions for her squamous cell carcinoma lesions on her legs. 4/28; the patient has bilateral lower extremity  wounds predominantly on the right lateral upper leg, right anterior lower leg which in itself is probably a recurrent cancer as well as the left lateral lower leg. She has recurrent squamous cell carcinoma currently being treated with an IV infusion. She also has scattered nodules on her upper lower legs and thighs I am not certain of all of this is felt to be malignant as well We have been using silver alginate on the open areas wrapping her legs. She also has her external compression pumps at home but I am not clear that she is going to use these 5/2; patient presents for follow-up. She has not been taking her diuretics as prescribed. She sees Dr. Dian Situ, dermatology at the end of the week. She tolerated the compression wraps well today. She has her juxta lite compressions. 5/8; patient presents for  follow-up. She saw Dr. Dian Situ, dermatology and she has nothing to report on. She has been using her juxta lite compression to the right lower extremity. She has tolerated the compression wrap well in the left lower extremity. She has not been taking her diuretic. She is scheduled to continue her infusions for her squamous cell carcinoma on her legs. 5/18; patient presents for follow-up. We wrapped her in 3 layer compression at last clinic visit. She has no issues or complaints today. 5/22; patient presents for follow-up. She has been wrapped in 3 layer compression bilaterally to her lower extremities over the past week. She has been scratching at the top of the wrap and picking at her skin. This area has increased warmth and erythema. 5/30; patient presents for follow-up. She completed Keflex with resolution of her symptoms to the right lower extremity. She no longer has increased warmth or erythema. She continues her infusions for her squamous cell carcinoma lesions. We continue to wrap her in 3 layer compression with silver alginate. She currently denies signs of infection. 6/8; the patient went to see Dr. Ronnald Ramp of dermatology 2 days ago unfortunately we do not have his notes. They removed her compression wraps of course but did not adequately replace them. She comes in today with 3 wounds on the right lateral upper calf 1 medially below the obvious squamous cell carcinoma there is a large necrotic wound on the left lateral tibial area on the left and several blisters. The patient remains on Cemiplimad infusions for the squamous cell carcinoma bilaterally. She is not felt to be a good candidate for radiation. Some of the skin changes superiorly in the upper legs bilaterally according to Dr. Ronnald Ramp related to these infusions the same like fleshy nodules to me not blisters Electronic Signature(s) Signed: 06/27/2021 4:23:09 PM By: Linton Ham MD Entered By: Linton Ham on 06/27/2021  14:52:44 -------------------------------------------------------------------------------- Physical Exam Details Patient Name: Date of Service: [name], [place]. 06/27/2021 1:30 PM Medical Record Number: [id number] Patient Account Number: [id number] Date of Birth/Sex: Treating RN: 1925-02-22 (88 y.o. [name]: [name] Other Clinician: Referring Bell: Treating Bell/Extender: Thayer Headings in Treatment: 37 Constitutional Sitting or standing Blood Pressure is within target range for patient.. Pulse regular and within target range for patient.[name] Respirations regular, non-labored and within target range.. Temperature is normal and within the target range for the patient.[name] Appears in no distress. Cardiovascular Uncontrolled swelling bilaterally.. Notes Wound exam; multiple areas. She has several blisters on the left upper anterior leg. Necrotic wound on the left lateral mid tibia. Several new open areas on the right anterior  lateral lower leg. She also has a wound on the right medial lower leg which has not new and just underneath an obvious carcinoma. Electronic Signature(s) Signed: 06/27/2021 4:23:09 PM By: Linton Ham MD Entered By: Linton Ham on 06/27/2021 14:53:50 -------------------------------------------------------------------------------- Physician Orders Details Patient Name: Date of Service: [name], [place]. 06/27/2021 1:30 PM Medical Record Number: [id number] Patient Account Number: [id number] Date of Birth/Sex: Treating RN: 1926-01-14 (88 y.o. [name], [name] Primary Care Bell: [name] Other Clinician: Referring Bell: Treating Bell/Extender: Thayer Headings in Treatment: 78 Verbal / Phone Orders: No Diagnosis Coding ICD-10 Coding Code Description C44.92 Squamous cell carcinoma of skin, unspecified L97.812 Non-pressure chronic ulcer of  other part of right lower leg with fat layer exposed L97.822 Non-pressure chronic ulcer of other part of left lower leg with fat layer exposed I89.0 Lymphedema, not elsewhere classified I87.313 Chronic venous hypertension (idiopathic) with ulcer of bilateral lower extremity Follow-up Appointments ppointment in 1 week. - Dr. Heber Staves and [place], Room 8 07/02/2021 130pm Tuesday Return A Dr. Heber  and [place], Room 8 07/11/2021 130pm Thursday Bathing/ Shower/ Hygiene May shower with protection but do not get wound dressing(s) wet. - Use cast protector Edema Control - Lymphedema / SCD / Other Lymphedema Pumps. Use Lymphedema pumps on leg(s) 2-3 times a day for 45-60 minutes. If wearing any wraps or hose, do not remove them. Continue exercising as instructed. - use one hour each time twice a day. Elevate legs to the level of the heart or above for 30 minutes daily and/or when sitting, a frequency of: - elevate the legs throughout the day heart level if possible. Avoid standing for long periods of time. Exercise regularly Additional Orders / Instructions Follow Nutritious Diet Wound Treatment Wound #20 - Lower Leg Wound Laterality: Right, Circumferential Cleanser: Soap and Water 1 x Per Week/30 Days Discharge Instructions: May shower and wash wound with dial antibacterial soap and water prior to dressing change. Cleanser: Wound Cleanser 1 x Per Week/30 Days Discharge Instructions: Cleanse the wound with wound cleanser prior to applying a clean dressing using gauze sponges, not tissue or cotton balls. Peri-Wound Care: Sween Lotion (Moisturizing lotion) 1 x Per Week/30 Days Discharge Instructions: Apply moisturizing lotion as directed Prim Dressing: KerraCel Ag Gelling Fiber Dressing, 4x5 in (silver alginate) 1 x Per Week/30 Days ary Discharge Instructions: Apply silver alginate to wound bed as instructed Secondary Dressing: ABD Pad, 8x10 1 x Per Week/30 Days Discharge Instructions: Apply over  primary dressing as directed. Secondary Dressing: Woven Gauze Sponge, Non-Sterile 4x4 in 1 x Per Week/30 Days Discharge Instructions: Apply over primary dressing as directed. Compression Wrap: ThreePress (3 layer compression wrap) 1 x Per Week/30 Days Discharge Instructions: Apply three layer compression as directed. Wound #7RR - Lower Leg Wound Laterality: Left, Circumferential Cleanser: Soap and Water 1 x Per Week/30 Days Discharge Instructions: May shower and wash wound with dial antibacterial soap and water prior to dressing change. Cleanser: Wound Cleanser 1 x Per Week/30 Days Discharge Instructions: Cleanse the wound with wound cleanser prior to applying a clean dressing using gauze sponges, not tissue or cotton balls. Peri-Wound Care: Sween Lotion (Moisturizing lotion) 1 x Per Week/30 Days Discharge Instructions: Apply moisturizing lotion as directed Prim Dressing: KerraCel Ag Gelling Fiber Dressing, 4x5 in (silver alginate) 1 x Per Week/30 Days ary Discharge Instructions: Apply silver alginate to wound bed as instructed Secondary Dressing: ABD Pad, 8x10 1 x Per Week/30 Days Discharge Instructions: Apply over primary dressing as directed. Secondary  Dressing: Woven Gauze Sponge, Non-Sterile 4x4 in 1 x Per Week/30 Days Discharge Instructions: Apply over primary dressing as directed. Compression Wrap: ThreePress (3 layer compression wrap) 1 x Per Week/30 Days Discharge Instructions: Apply three layer compression as directed. Electronic Signature(s) Signed: 06/27/2021 4:23:09 PM By: Linton Ham MD Signed: 06/27/2021 4:29:12 PM By: Deon Pilling RN, BSN Entered By: Deon Pilling on 06/27/2021 14:23:43 -------------------------------------------------------------------------------- Problem List Details Patient Name: Date of Service: [name], [place]. 06/27/2021 1:30 PM Medical Record Number: [id number] Patient Account Number: [id number] Date of Birth/Sex: Treating RN: 1925/01/29 (88  y.o. [name], [name] Primary Care Bell: [name] Other Clinician: Referring Bell: Treating Bell/Extender: Thayer Headings in Treatment: 34 Active Problems ICD-10 Encounter Code Description Active Date MDM Diagnosis C44.92 Squamous cell carcinoma of skin, unspecified 06/11/2020 No Yes L97.812 Non-pressure chronic ulcer of other part of right lower leg with fat layer 06/11/2020 No Yes exposed L97.822 Non-pressure chronic ulcer of other part of left lower leg with fat layer exposed5/23/2022 No Yes I89.0 Lymphedema, not elsewhere classified 01/31/2021 No Yes I87.313 Chronic venous hypertension (idiopathic) with ulcer of bilateral lower extremity 03/21/2021 No Yes Inactive Problems Resolved Problems Electronic Signature(s) Signed: 06/27/2021 4:23:09 PM By: Linton Ham MD Entered By: Linton Ham on 06/27/2021 14:45:58 -------------------------------------------------------------------------------- Progress Note Details Patient Name: Date of Service: [name], [place]. 06/27/2021 1:30 PM Medical Record Number: [id number] Patient Account Number: [id number] Date of Birth/Sex: Treating RN: 10-30-1925 (88 y.o. [name]: [name] Other Clinician: Referring Bell: Treating Bell/Extender: Thayer Headings in Treatment: 75 Subjective History of Present Illness (HPI) The following HPI elements were documented for the patient's wound: Location: left leg Admission 5/23 Ms. 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 She states that the wound on the right has been present for a year. The 1 on the left has opened up 1 month ago. She is followed  with oncology for this issue as she had biopsies that showed squamous cell carcinoma. She is also seeing radiation oncology for treatment options. She presents today because she would like for her wounds to be healed by [place]. She currently denies signs of infection. 6/1; patient presents for 1 week follow-up. She states she has tolerated the leg wraps well. She states these do not bother her and is happy to continue with them. She is scheduled to see her oncologist today to go over treatment options for the bilateral lower extremity squamous cell carcinoma. Radiation is currently not a recommended option. Patient states she overall feels well. 6/22; patient presents for 3-week follow-up. She has tolerated the wraps well until her last wrap where she states they were uncomfortable. She attributes this to the home health nurse. She denies signs of infection. She has started her first treatment of antibody infusions for her Bilateral lower extremity squamous cell carcinoma. She has no complaints today. 7/21; patient presents for 1 month follow-up. Unfortunately she has not had good experience with her wrap changes with home health. She would like to do her own dressing changes. She continues to do her antibody infusions. She denies signs of infection. 7/28; patient presents for 1 week follow-up. At last clinic visit she was switched to daily dressing changes due to issues with the  wrap and home health placing them. Unfortunately she has developed weeping to her legs bilaterally. She would like to be placed in wraps today. She would also like to follow with [place] weekly for wrap changes instead of having home health change them. She denies signs of infection. 8/4; patient presents for 1 week follow-up. She has tolerated the Kerlix/Coban wraps well. She no longer has weeping to her legs. She took the wrap off 1 day before coming in to be able to take a shower. She has no issues or complaints today. She denies  signs of infection. 8/18; patient presents for follow-up. Patient has tolerated the wraps well. She brought her Velcro compression wraps today. She has no issues or complaints today. She had her chemotherapy infusion yesterday without issues. She denies signs of infection. 8/25; patient presents for follow-up. She used her juxta lite compressions for the past week. It is unclear if she is able to put these on correctly since she states she has a hard time getting them to look right. She reports 2 open wounds. She currently denies signs of infection. 9/1; patient presents for 1 week follow-up. She has 1 open wound. She tolerated the compression wraps well. She currently denies signs of infection. 9/8; patient presents for 1 week follow-up. She has 2 open wounds 1 on each leg. She has tolerated the compression wrap well. She currently denies signs of infection. 9/15; patient presents for 1 week follow-up. She now has 3 wounds. 2 on the left and 1 on the right. She continues to tolerate the compression wrap well. She currently denies signs of infection. She has obtained furosemide by her primary care physician and would like to discuss when to take this. 9/22; patient presents for 1 week follow-up. She has scattered wounds on her lower extremities bilaterally. She did take furosemide twice in the past week. She does not recall having to urinate more frequently. She tolerated the 3 layer compression well. She denies signs of infection. 10/13; patient has not been here recently because of [place]. Apparently the facility was only putting gauze on her legs. This is a patient I do not normally see. 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 Ronnald Ramp at [facility] dermatology. She is quite convincing that she did not have radiation to her lower legs. It is likely she also has significant chronic venous insufficiency stasis dermatitis.  We have been using silver alginate under kerlix Cob[place]. She has obvious open areas medially on the right and areas on the left. She also has areas of extensive dry flaking adherent areas on the right and to a lesser extent on the left anterior. Nodular areas on the left lateral lower leg left posterior calf and right mid calf medially. 10/21; patient presents for follow-up. She has no issues or complaints today. She has tolerated the 3 layer compression well bilaterally. She denies signs of infection. 10/27; patient presents for follow-up. She continues to tolerate the 3 layer compression wrap well. She denies signs of infection. 11/30; patient presents for follow-up. She has no issues or complaints today. 11/10; patient arrived in clinic today for nurse visit accompanied by her daughter from [place]. The daughter had multiple questions so we turned this into doctors visit. Apparently after the last time I saw this woman in October she went to see Dr. Ronnald Ramp but he did not take the wraps off. She previously was treated with Cemiplimab  for her squamous cell carcinoma. She did not receive radiation to her legs. I had wanted Dr. Ronnald Ramp to look at this because of the exceptionally damaged skin on her lower legs bilaterally. She has odd looking wounds on the left medial lower leg also extending posteriorly which we have not made a lot of progress on. But she also has a raised hyperkeratotic nodules on the right anterior tibial area plaques on the left medial lower thigh. These are not areas that are under compression. We have been using silver alginate on any open areas Liberal TCA under 3 layer compression. 11/17; patient presents for follow-up. She had her wraps taken off yesterday for her dermatology appointment. She reports excessive weeping to her legs bilaterally after the wraps were taken off. She currently denies signs of infection. 12/12/2020 upon evaluation today patient appears to be doing about the  same in regard to her wounds. She was actually started on Cipro after having biopsies apparently at her dermatology clinic she does not have the results back from the actual biopsy but the culture did return and apparently they placed her on the Cipro she started that just this morning. Other than that her legs appear to be doing okay at this time. 12/1; patient presents for follow-up. She has no issues or complaints today. She has started Lasix 40 mg daily to help with her bilateral leg swelling. 12/8; patient presents for follow-up. She has no issues or complaints today. 12/15; patient presents for 1 week follow-up. She has no issues or complaints today. 12/22; patient presents for follow-up. She has no issues or complaints today. 1/5; patient presents for follow-up. She has no issues or complaints today. She has tolerated the compression wraps well. 1/12; patient presents for follow-up. She is tolerated the compression wrap well and has no issues or complaints today. She denies signs of infection. 1/19; patient presents for follow-up. She took the compression wraps off 1 to 2 days ago to take a shower and did not have compression wraps replaced. She reports increased blistering throughout her legs bilaterally. She currently denies signs of infection. 1/26; patient presents for follow-up. She has no issues or complaints today. She tolerated the compression wraps well. She has not heard from oncology to schedule an appointment. 2/2; patient presents for follow-up. She has no issues or complaints today. She continues to tolerate the compression wrap well. 2/16; patient presents for follow-up. She has no issues or complaints today. 2/23; patient presents for follow-up. She has no issues or complaints today. Tomorrow she has an appointment with oncology to look at the suspicious lesion on her right lower extremity. She currently denies signs of infection. 3/2; patient presents for follow-up. She has  been using her juxta light compression to the right lower extremity however there has been increased swelling and opening of wounds throughout the legs with weeping. She currently denies signs of infection. She had no issues with the compression wrap to the left lower extremity. 3/16; patient presents for follow-up. She has no issues or complaints today. She states that she has been restarted on infusions for her squamous cell carcinoma of her legs. She has also been increased on Lasix to 40 mg twice daily. She has noticed a decrease in swelling to her legs. 3/23; patient presents for follow-up. She starts her second infusion next week for her squamous cell carcinoma lesions to her legs. She reports no issues with the compression wraps. 3/30; patient presents for follow-up. She has no issues or  complaints today. 4/6; patient presents for follow-up. Her left lower extremity wounds have healed. She has her juxta light compression with her today. She has no issues or complaints today. 4/13; this is a patient we actually discharged to juxta lite stockings on the left leg the last time she was here. She came in on Monday for [place] to look at the leg and a nurse visit. She had massive swelling and numerous blisters and wound reopening. We put her back in 3 layer compression although I did not generate a note. Silver alginate on the wounded areas. We have also been doing the same on the right. In the meantime she is obtained her external compression pumps that were previously ordered and she started to use them. She has skin cancers that are obvious on the right leg x2 her appointment with Dr. Jarome Matin of dermatology is on May 5.. She is also receiving weekly chemotherapy for recurrent presumably squamous cell carcinomas apparently has had 2 treatments 4/20; patient presents for follow-up. She has her new juxta lite compressions today. She continues to have open wounds to the left lower extremity. She  has follow-up with dermatology in 2 weeks. She continues to have IV infusions for her squamous cell carcinoma lesions on her legs. 4/28; the patient has bilateral lower extremity wounds predominantly on the right lateral upper leg, right anterior lower leg which in itself is probably a recurrent cancer as well as the left lateral lower leg. She has recurrent squamous cell carcinoma currently being treated with an IV infusion. She also has scattered nodules on her upper lower legs and thighs I am not certain of all of this is felt to be malignant as well We have been using silver alginate on the open areas wrapping her legs. She also has her external compression pumps at home but I am not clear that she is going to use these 5/2; patient presents for follow-up. She has not been taking her diuretics as prescribed. She sees Dr. Dian Situ, dermatology at the end of the week. She tolerated the compression wraps well today. She has her juxta lite compressions. 5/8; patient presents for follow-up. She saw Dr. Dian Situ, dermatology and she has nothing to report on. She has been using her juxta lite compression to the right lower extremity. She has tolerated the compression wrap well in the left lower extremity. She has not been taking her diuretic. She is scheduled to continue her infusions for her squamous cell carcinoma on her legs. 5/18; patient presents for follow-up. We wrapped her in 3 layer compression at last clinic visit. She has no issues or complaints today. 5/22; patient presents for follow-up. She has been wrapped in 3 layer compression bilaterally to her lower extremities over the past week. She has been scratching at the top of the wrap and picking at her skin. This area has increased warmth and erythema. 5/30; patient presents for follow-up. She completed Keflex with resolution of her symptoms to the right lower extremity. She no longer has increased warmth or erythema. She continues her infusions for  her squamous cell carcinoma lesions. We continue to wrap her in 3 layer compression with silver alginate. She currently denies signs of infection. 6/8; the patient went to see Dr. Ronnald Ramp of dermatology 2 days ago unfortunately we do not have his notes. They removed her compression wraps of course but did not adequately replace them. She comes in today with 3 wounds on the right lateral upper calf 1 medially below  the obvious squamous cell carcinoma there is a large necrotic wound on the left lateral tibial area on the left and several blisters. The patient remains on Cemiplimad infusions for the squamous cell carcinoma bilaterally. She is not felt to be a good candidate for radiation. Some of the skin changes superiorly in the upper legs bilaterally according to Dr. Ronnald Ramp related to these infusions the same like fleshy nodules to me not blisters Objective Constitutional Sitting or standing Blood Pressure is within target range for patient.. Pulse regular and within target range for patient.[name] Respirations regular, non-labored and within target range.. Temperature is normal and within the target range for the patient.[name] Appears in no distress. Vitals Time Taken: 1:36 PM, Height: 65 in, Weight: 163 lbs, BMI: 27.1, Temperature: 97.7 F, Pulse: 84 bpm, Respiratory Rate: 18 breaths/min, Blood Pressure: 113/69 mmHg. Cardiovascular Uncontrolled swelling bilaterally.. General Notes: Wound exam; multiple areas. She has several blisters on the left upper anterior leg. Necrotic wound on the left lateral mid tibia. Several new open areas on the right anterior lateral lower leg. She also has a wound on the right medial lower leg which has not new and just underneath an obvious carcinoma. Integumentary (Hair, Skin) Wound #20 status is Open. Original cause of wound was Gradually Appeared. The date acquired was: 06/25/2021. The wound is located on the Right,Circumferential Lower Leg. The wound measures 23cm length x  22cm width x 0.1cm depth; 397.411cm^2 area and 39.741cm^3 volume. There is Fat Layer (Subcutaneous Tissue) exposed. There is no tunneling or undermining noted. There is a large amount of serosanguineous drainage noted. The wound margin is distinct with the outline attached to the wound base. There is large (67-100%) pink granulation within the wound bed. Wound #7RR status is Open. Original cause of wound was Blister. The date acquired was: 04/20/2020. The wound has been in treatment 54 weeks. The wound is located on the Left,Circumferential Lower Leg. The wound measures 20cm length x 22cm width x 0.1cm depth; 345.575cm^2 area and 34.558cm^3 volume. There is Fat Layer (Subcutaneous Tissue) exposed. There is no tunneling or undermining noted. There is a medium amount of serosanguineous drainage noted. The wound margin is distinct with the outline attached to the wound base. There is large (67-100%) red, pink granulation within the wound bed. There is no necrotic tissue within the wound bed. Assessment Active Problems ICD-10 Squamous cell carcinoma of skin, unspecified Non-pressure chronic ulcer of other part of right lower leg with fat layer exposed Non-pressure chronic ulcer of other part of left lower leg with fat layer exposed Lymphedema, not elsewhere classified Chronic venous hypertension (idiopathic) with ulcer of bilateral lower extremity Procedures Wound #20 Pre-procedure diagnosis of Wound #20 is a Lymphedema located on the Right,Circumferential Lower Leg . There was a Three Layer Compression Therapy Procedure by Deon Pilling, RN. Post procedure Diagnosis Wound #20: Same as Pre-Procedure Wound #7RR Pre-procedure diagnosis of Wound #7RR is a Malignant Wound located on the Left,Circumferential Lower Leg . There was a Three Layer Compression Therapy Procedure by Deon Pilling, RN. Post procedure Diagnosis Wound #7RR: Same as Pre-Procedure Plan Follow-up Appointments: Return Appointment  in 1 week. - Dr. Heber [place] [facility] [place], Room 8 07/02/2021 130pm Tuesday Dr. Heber [place] [facility] [place], Room 8 07/11/2021 130pm Thursday Bathing/ Shower/ Hygiene: May shower with protection but do not get wound dressing(s) wet. - Use cast protector Edema Control - Lymphedema / SCD / Other: Lymphedema Pumps. Use Lymphedema pumps on leg(s) 2-3 times a day for 45-60 minutes. If wearing any  wraps or hose, do not remove them. Continue exercising as instructed. - use one hour each time twice a day. Elevate legs to the level of the heart or above for 30 minutes daily and/or when sitting, a frequency of: - elevate the legs throughout the day heart level if possible. Avoid standing for long periods of time. Exercise regularly Additional Orders / Instructions: Follow Nutritious Diet WOUND #20: - Lower Leg Wound Laterality: Right, Circumferential Cleanser: Soap and Water 1 x Per Week/30 Days Discharge Instructions: May shower and wash wound with dial antibacterial soap and water prior to dressing change. Cleanser: Wound Cleanser 1 x Per Week/30 Days Discharge Instructions: Cleanse the wound with wound cleanser prior to applying a clean dressing using gauze sponges, not tissue or cotton balls. Peri-Wound Care: Sween Lotion (Moisturizing lotion) 1 x Per Week/30 Days Discharge Instructions: Apply moisturizing lotion as directed Prim Dressing: KerraCel Ag Gelling Fiber Dressing, 4x5 in (silver alginate) 1 x Per Week/30 Days ary Discharge Instructions: Apply silver alginate to wound bed as instructed Secondary Dressing: ABD Pad, 8x10 1 x Per Week/30 Days Discharge Instructions: Apply over primary dressing as directed. Secondary Dressing: Woven Gauze Sponge, Non-Sterile 4x4 in 1 x Per Week/30 Days Discharge Instructions: Apply over primary dressing as directed. Com pression Wrap: ThreePress (3 layer compression wrap) 1 x Per Week/30 Days Discharge Instructions: Apply three layer compression as directed. WOUND #7RR: -  Lower Leg Wound Laterality: Left, Circumferential Cleanser: Soap and Water 1 x Per Week/30 Days Discharge Instructions: May shower and wash wound with dial antibacterial soap and water prior to dressing change. Cleanser: Wound Cleanser 1 x Per Week/30 Days Discharge Instructions: Cleanse the wound with wound cleanser prior to applying a clean dressing using gauze sponges, not tissue or cotton balls. Peri-Wound Care: Sween Lotion (Moisturizing lotion) 1 x Per Week/30 Days Discharge Instructions: Apply moisturizing lotion as directed Prim Dressing: KerraCel Ag Gelling Fiber Dressing, 4x5 in (silver alginate) 1 x Per Week/30 Days ary Discharge Instructions: Apply silver alginate to wound bed as instructed Secondary Dressing: ABD Pad, 8x10 1 x Per Week/30 Days Discharge Instructions: Apply over primary dressing as directed. Secondary Dressing: Woven Gauze Sponge, Non-Sterile 4x4 in 1 x Per Week/30 Days Discharge Instructions: Apply over primary dressing as directed. Com pression Wrap: ThreePress (3 layer compression wrap) 1 x Per Week/30 Days Discharge Instructions: Apply three layer compression as directed. 1. We applied silver alginate ABDs and put the patient back in 3 layer compression 2. She has brand-new juxta lites. She lives in the independent part of the [place] complex. I am not certain whether there is a nurse to do/assist application of the juxta lites at the independent part of [place] or with whether that would cause her to have to move up to assisted living. I spoke to her daughter about this who is visiting from [place] 3. In reality I do not know that the patient really needs to come in here if the facility can put silver alginate on any wounded area whether it is malignant or not and apply the juxta lites properly. This would have to be done on a daily basis. 4. Her daughter will speak to the facility about whether this would necessitate going up the ladder to assisted  living Electronic Signature(s) Signed: 06/27/2021 4:23:09 PM By: Linton Ham MD Entered By: Linton Ham on 06/27/2021 14:55:45 -------------------------------------------------------------------------------- SuperBill Details Patient Name: Date of Service: [name], [place]. 06/27/2021 Medical Record Number: [id number] Patient Account Number: [id number] Date of Birth/Sex:  Treating RN: 08-Mar-1925 (88 y.o. [name], [name] Primary Care Bell: [name] Other Clinician: Referring Bell: Treating Bell/Extender: Thayer Headings in Treatment: 17 Diagnosis Coding ICD-10 Codes Code Description C44.92 Squamous cell carcinoma of skin, unspecified L97.812 Non-pressure chronic ulcer of other part of right lower leg with fat layer exposed L97.822 Non-pressure chronic ulcer of other part of left lower leg with fat layer exposed I89.0 Lymphedema, not elsewhere classified I87.313 Chronic venous hypertension (idiopathic) with ulcer of bilateral lower extremity Facility Procedures CPT4: Code [id number] Description: 16 BILATERAL: Application of multi-layer venous compression system; leg (below knee), including ankle and t. Modifier: Quantity: 1 Physician Procedures : CPT4 Code Description Modifier [id number] - WC PHYS LEVEL 3 - EST PT ICD-10 Diagnosis Description V03.500 Non-pressure chronic ulcer of other part of right lower leg with fat layer exposed L97.822 Non-pressure chronic ulcer of other part of left  lower leg with fat layer exposed I87.313 Chronic venous hypertension (idiopathic) with ulcer of bilateral lower extremity C44.92 Squamous cell carcinoma of skin, unspecified Quantity: 1 Electronic Signature(s) Signed: 06/27/2021 4:23:09 PM By: Linton Ham MD Entered By: Linton Ham on 06/27/2021 14:56:08

## 2021-07-02 ENCOUNTER — Encounter (HOSPITAL_BASED_OUTPATIENT_CLINIC_OR_DEPARTMENT_OTHER): Payer: Medicare Other | Admitting: Internal Medicine

## 2021-07-02 DIAGNOSIS — L97812 Non-pressure chronic ulcer of other part of right lower leg with fat layer exposed: Secondary | ICD-10-CM | POA: Diagnosis not present

## 2021-07-02 DIAGNOSIS — L97822 Non-pressure chronic ulcer of other part of left lower leg with fat layer exposed: Secondary | ICD-10-CM

## 2021-07-02 DIAGNOSIS — I87313 Chronic venous hypertension (idiopathic) with ulcer of bilateral lower extremity: Secondary | ICD-10-CM

## 2021-07-02 DIAGNOSIS — M1990 Unspecified osteoarthritis, unspecified site: Secondary | ICD-10-CM | POA: Diagnosis not present

## 2021-07-02 DIAGNOSIS — C4492 Squamous cell carcinoma of skin, unspecified: Secondary | ICD-10-CM

## 2021-07-02 DIAGNOSIS — I890 Lymphedema, not elsewhere classified: Secondary | ICD-10-CM | POA: Diagnosis not present

## 2021-07-02 NOTE — Progress Notes (Signed)
[name] ([id number]) , Visit Report for 07/02/2021 Chief Complaint Document Details Patient Name: Date of Service: [name], [place]. 07/02/2021 1:30 PM Medical Record Number: [id number] Patient Account Number: [id number] Date of Birth/Sex: Treating RN: Sep 27, 1925 (88 y.o. [name] Primary Care Provider: [name] Other Clinician: Referring Provider: Treating Provider/Extender: [name] in Treatment: 7 Information Obtained from: Patient Chief Complaint Bilateral lower extremity wounds that have been biopsied and positive for squamous cell carcinoma Bilateral lower extremity wounds due to chronic venous insufficiency/lymphedema Electronic Signature(s) Signed: 07/02/2021 5:27:49 PM By: Kalman Shan DO Entered By: Kalman Shan on 07/02/2021 15:33:41 -------------------------------------------------------------------------------- HPI Details Patient Name: Date of Service: [name], [name]. 07/02/2021 1:30 PM Medical Record Number: [id number] Patient Account Number: [id number] Date of Birth/Sex: Treating RN: 1925/02/09 (88 y.o. 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 She states that the wound on the right has been present for a year. The 1 on the left has opened up 1 month ago. She is followed with oncology for this issue as she had biopsies that showed squamous cell carcinoma. She is also seeing radiation oncology for treatment  options. She presents today because she would like for her wounds to be healed by [place]. She currently denies signs of infection. 6/1; patient presents for 1 week follow-up. She states she has tolerated the leg wraps well. She states these do not bother her and is happy to continue with them. She is scheduled to see her oncologist today to go over treatment options for the bilateral lower extremity squamous cell carcinoma. Radiation is currently not a recommended option. Patient states she overall feels well. 6/22; patient presents for 3-week follow-up. She has tolerated the wraps well until her last wrap where she states they were uncomfortable. She attributes this to the home health nurse. She denies signs of infection. She has started her first treatment of antibody infusions for her Bilateral lower extremity squamous cell carcinoma. She has no complaints today. 7/21; patient presents for 1 month follow-up. Unfortunately she has not had good experience with her wrap changes with home health. She would like to do her own dressing changes. She continues to do her antibody infusions. She denies signs of infection. 7/28; patient presents for 1 week follow-up. At last clinic visit she was switched to daily dressing changes due to issues with the wrap and home health placing them. Unfortunately she has developed weeping to her legs bilaterally. She would like to be placed in wraps today. She would also like to follow with [place] weekly for wrap changes instead of having home health change them. She denies signs of infection. 8/4; patient presents for 1 week follow-up. She has tolerated the Kerlix/Coban wraps well. She no longer has weeping to her legs. She took the wrap off 1 day before coming in to be able to take a shower. She has no issues or complaints today. She denies signs of infection. 8/18; patient presents for follow-up. Patient has tolerated the wraps well. She brought her Velcro compression wraps  today. She has no  issues or complaints today. She had her chemotherapy infusion yesterday without issues. She denies signs of infection. 8/25; patient presents for follow-up. She used her juxta lite compressions for the past week. It is unclear if she is able to put these on correctly since she states she has a hard time getting them to look right. She reports 2 open wounds. She currently denies signs of infection. 9/1; patient presents for 1 week follow-up. She has 1 open wound. She tolerated the compression wraps well. She currently denies signs of infection. 9/8; patient presents for 1 week follow-up. She has 2 open wounds 1 on each leg. She has tolerated the compression wrap well. She currently denies signs of infection. 9/15; patient presents for 1 week follow-up. She now has 3 wounds. 2 on the left and 1 on the right. She continues to tolerate the compression wrap well. She currently denies signs of infection. She has obtained furosemide by her primary care physician and would like to discuss when to take this. 9/22; patient presents for 1 week follow-up. She has scattered wounds on her lower extremities bilaterally. She did take furosemide twice in the past week. She does not recall having to urinate more frequently. She tolerated the 3 layer compression well. She denies signs of infection. 10/13; patient has not been here recently because of [place]. Apparently the facility was only putting gauze on her legs. This is a patient I do not normally see. 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 Ronnald Ramp at [facility] dermatology. She is quite convincing that she did not have radiation to her lower legs. It is likely she also has significant chronic venous insufficiency stasis dermatitis. We have been using silver alginate under kerlix Coban. She has obvious open areas medially on the right and areas on the left. She also has  areas of extensive dry flaking adherent areas on the right and to a lesser extent on the left anterior. Nodular areas on the left lateral lower leg left posterior calf and right mid calf medially. 10/21; patient presents for follow-up. She has no issues or complaints today. She has tolerated the 3 layer compression well bilaterally. She denies signs of infection. 10/27; patient presents for follow-up. She continues to tolerate the 3 layer compression wrap well. She denies signs of infection. 11/30; patient presents for follow-up. She has no issues or complaints today. 11/10; patient arrived in clinic today for nurse visit accompanied by her daughter from [place]. The daughter had multiple questions so we turned this into doctors visit. Apparently after the last time I saw this woman in October she went to see Dr. Ronnald Ramp but he did not take the wraps off. She previously was treated with Cemiplimab for her squamous cell carcinoma. She did not receive radiation to her legs. I had wanted Dr. Ronnald Ramp to look at this because of the exceptionally damaged skin on her lower legs bilaterally. She has odd looking wounds on the left medial lower leg also extending posteriorly which we have not made a lot of progress on. But she also has a raised hyperkeratotic nodules on the right anterior tibial area plaques on the left medial lower thigh. These are not areas that are under compression. We have been using silver alginate on any open areas Liberal TCA under 3 layer compression. 11/17; patient presents for follow-up. She had her wraps taken off yesterday for her dermatology appointment. She reports excessive  weeping to her legs bilaterally after the wraps were taken off. She currently denies signs of infection. 12/12/2020 upon evaluation today patient appears to be doing about the same in regard to her wounds. She was actually started on Cipro after having biopsies apparently at her dermatology clinic she does not  have the results back from the actual biopsy but the culture did return and apparently they placed her on the Cipro she started that just this morning. Other than that her legs appear to be doing okay at this time. 12/1; patient presents for follow-up. She has no issues or complaints today. She has started Lasix 40 mg daily to help with her bilateral leg swelling. 12/8; patient presents for follow-up. She has no issues or complaints today. 12/15; patient presents for 1 week follow-up. She has no issues or complaints today. 12/22; patient presents for follow-up. She has no issues or complaints today. 1/5; patient presents for follow-up. She has no issues or complaints today. She has tolerated the compression wraps well. 1/12; patient presents for follow-up. She is tolerated the compression wrap well and has no issues or complaints today. She denies signs of infection. 1/19; patient presents for follow-up. She took the compression wraps off 1 to 2 days ago to take a shower and did not have compression wraps replaced. She reports increased blistering throughout her legs bilaterally. She currently denies signs of infection. 1/26; patient presents for follow-up. She has no issues or complaints today. She tolerated the compression wraps well. She has not heard from oncology to schedule an appointment. 2/2; patient presents for follow-up. She has no issues or complaints today. She continues to tolerate the compression wrap well. 2/16; patient presents for follow-up. She has no issues or complaints today. 2/23; patient presents for follow-up. She has no issues or complaints today. Tomorrow she has an appointment with oncology to look at the suspicious lesion on her right lower extremity. She currently denies signs of infection. 3/2; patient presents for follow-up. She has been using her juxta light compression to the right lower extremity however there has been increased swelling and opening of wounds  throughout the legs with weeping. She currently denies signs of infection. She had no issues with the compression wrap to the left lower extremity. 3/16; patient presents for follow-up. She has no issues or complaints today. She states that she has been restarted on infusions for her squamous cell carcinoma of her legs. She has also been increased on Lasix to 40 mg twice daily. She has noticed a decrease in swelling to her legs. 3/23; patient presents for follow-up. She starts her second infusion next week for her squamous cell carcinoma lesions to her legs. She reports no issues with the compression wraps. 3/30; patient presents for follow-up. She has no issues or complaints today. 4/6; patient presents for follow-up. Her left lower extremity wounds have healed. She has her juxta light compression with her today. She has no issues or complaints today. 4/13; this is a patient we actually discharged to juxta lite stockings on the left leg the last time she was here. She came in on Monday for [place] to look at the leg and a nurse visit. She had massive swelling and numerous blisters and wound reopening. We put her back in 3 layer compression although I did not generate a note. Silver alginate on the wounded areas. We have also been doing the same on the right. In the meantime she is obtained her external compression pumps that were  previously ordered and she started to use them. She has skin cancers that are obvious on the right leg x2 her appointment with Dr. Jarome Matin of dermatology is on May 5.. She is also receiving weekly chemotherapy for recurrent presumably squamous cell carcinomas apparently has had 2 treatments 4/20; patient presents for follow-up. She has her new juxta lite compressions today. She continues to have open wounds to the left lower extremity. She has follow-up with dermatology in 2 weeks. She continues to have IV infusions for her squamous cell carcinoma lesions on her legs. 4/28;  the patient has bilateral lower extremity wounds predominantly on the right lateral upper leg, right anterior lower leg which in itself is probably a recurrent cancer as well as the left lateral lower leg. She has recurrent squamous cell carcinoma currently being treated with an IV infusion. She also has scattered nodules on her upper lower legs and thighs I am not certain of all of this is felt to be malignant as well We have been using silver alginate on the open areas wrapping her legs. She also has her external compression pumps at home but I am not clear that she is going to use these 5/2; patient presents for follow-up. She has not been taking her diuretics as prescribed. She sees Dr. Dian Situ, dermatology at the end of the week. She tolerated the compression wraps well today. She has her juxta lite compressions. 5/8; patient presents for follow-up. She saw Dr. Dian Situ, dermatology and she has nothing to report on. She has been using her juxta lite compression to the right lower extremity. She has tolerated the compression wrap well in the left lower extremity. She has not been taking her diuretic. She is scheduled to continue her infusions for her squamous cell carcinoma on her legs. 5/18; patient presents for follow-up. We wrapped her in 3 layer compression at last clinic visit. She has no issues or complaints today. 5/22; patient presents for follow-up. She has been wrapped in 3 layer compression bilaterally to her lower extremities over the past week. She has been scratching at the top of the wrap and picking at her skin. This area has increased warmth and erythema. 5/30; patient presents for follow-up. She completed Keflex with resolution of her symptoms to the right lower extremity. She no longer has increased warmth or erythema. She continues her infusions for her squamous cell carcinoma lesions. We continue to wrap her in 3 layer compression with silver alginate. She currently denies signs of  infection. 6/8; the patient went to see Dr. Ronnald Ramp of dermatology 2 days ago unfortunately we do not have his notes. They removed her compression wraps of course but did not adequately replace them. She comes in today with 3 wounds on the right lateral upper calf 1 medially below the obvious squamous cell carcinoma there is a large necrotic wound on the left lateral tibial area on the left and several blisters. The patient remains on Cemiplimad infusions for the squamous cell carcinoma bilaterally. She is not felt to be a good candidate for radiation. Some of the skin changes superiorly in the upper legs bilaterally according to Dr. Ronnald Ramp related to these infusions the same like fleshy nodules to me not blisters 6/13; patient presents for follow-up. We have been using silver alginate under 3 layer compression bilaterally. She has no issues or complaints today. Electronic Signature(s) Signed: 07/02/2021 5:27:49 PM By: Kalman Shan DO Entered By: Kalman Shan on 07/02/2021 15:35:57 -------------------------------------------------------------------------------- Physical Exam Details Patient Name: Date  of Service: [name], [name] 07/02/2021 1:30 PM Medical Record Number: [id number] Patient Account Number: [id number] Date of Birth/Sex: Treating RN: 1925/03/27 (88 y.o. [name] Primary Care Provider: [name] Other Clinician: Referring Provider: Treating Provider/Extender: [name] in Treatment: 24 Constitutional respirations regular, non-labored and within target range for patient.. Cardiovascular 2+ dorsalis pedis/posterior tibialis pulses. Psychiatric pleasant and cooperative. Notes Multiple small scattered open wounds to her lower extremities bilaterally limited to skin breakdown. 3 areas suspicious for malignancy. Good edema control. No weeping noted. No surrounding signs of infection. Electronic Signature(s) Signed:  07/02/2021 5:27:49 PM By: Kalman Shan DO Entered By: Kalman Shan on 07/02/2021 15:37:18 -------------------------------------------------------------------------------- Physician Orders Details Patient Name: Date of Service: [name], [place]. 07/02/2021 1:30 PM Medical Record Number: [id number] Patient Account Number: [id number] Date of Birth/Sex: Treating RN: 1925-08-04 (88 y.o. [name], [name] Primary Care Provider: [name] Other Clinician: Referring Provider: Treating Provider/Extender: [name] in Treatment: 52 Verbal / Phone Orders: No Diagnosis Coding ICD-10 Coding Code Description C44.92 Squamous cell carcinoma of skin, unspecified L97.812 Non-pressure chronic ulcer of other part of right lower leg with fat layer exposed L97.822 Non-pressure chronic ulcer of other part of left lower leg with fat layer exposed I89.0 Lymphedema, not elsewhere classified I87.313 Chronic venous hypertension (idiopathic) with ulcer of bilateral lower extremity Follow-up Appointments ppointment in 1 week. - Dr. Heber [place] and [facility], Room 9 07/08/2021 130pm Monday 145pm Return A Dr. Heber [place] and [place], Room 8 07/16/2021 130pm Tuesday 130pm Other: - Dermatology specialists will call you to schedule appt time. Bathing/ Shower/ Hygiene May shower with protection but do not get wound dressing(s) wet. - Use cast protector Edema Control - Lymphedema / SCD / Other Lymphedema Pumps. Use Lymphedema pumps on leg(s) 2-3 times a day for 45-60 minutes. If wearing any wraps or hose, do not remove them. Continue exercising as instructed. - use one hour each time twice a day. Elevate legs to the level of the heart or above for 30 minutes daily and/or when sitting, a frequency of: - elevate the legs throughout the day heart level if possible. Avoid standing for long periods of time. Exercise regularly Additional Orders / Instructions Follow Nutritious  Diet Wound Treatment Wound #20 - Lower Leg Wound Laterality: Right, Circumferential Cleanser: Soap and Water 1 x Per Week/30 Days Discharge Instructions: May shower and wash wound with dial antibacterial soap and water prior to dressing change. Cleanser: Wound Cleanser 1 x Per Week/30 Days Discharge Instructions: Cleanse the wound with wound cleanser prior to applying a clean dressing using gauze sponges, not tissue or cotton balls. Peri-Wound Care: Sween Lotion (Moisturizing lotion) 1 x Per Week/30 Days Discharge Instructions: Apply moisturizing lotion as directed Prim Dressing: KerraCel Ag Gelling Fiber Dressing, 4x5 in (silver alginate) 1 x Per Week/30 Days ary Discharge Instructions: Apply silver alginate to wound bed as instructed Secondary Dressing: ABD Pad, 8x10 1 x Per Week/30 Days Discharge Instructions: Apply over primary dressing as directed. Secondary Dressing: Woven Gauze Sponge, Non-Sterile 4x4 in 1 x Per Week/30 Days Discharge Instructions: Apply over primary dressing as directed. Compression Wrap: ThreePress (3 layer compression wrap) 1 x Per Week/30 Days Discharge Instructions: Apply three layer compression as directed. Wound #7RR - Lower Leg Wound Laterality: Left, Circumferential Cleanser: Soap and Water 1 x Per Week/30 Days Discharge Instructions: May shower and wash wound with dial antibacterial soap and water prior to dressing change. Cleanser: Wound Cleanser 1 x  Per Week/30 Days Discharge Instructions: Cleanse the wound with wound cleanser prior to applying a clean dressing using gauze sponges, not tissue or cotton balls. Peri-Wound Care: Sween Lotion (Moisturizing lotion) 1 x Per Week/30 Days Discharge Instructions: Apply moisturizing lotion as directed Prim Dressing: KerraCel Ag Gelling Fiber Dressing, 4x5 in (silver alginate) 1 x Per Week/30 Days ary Discharge Instructions: Apply silver alginate to wound bed as instructed Secondary Dressing: ABD Pad, 8x10 1 x Per  Week/30 Days Discharge Instructions: Apply over primary dressing as directed. Secondary Dressing: Woven Gauze Sponge, Non-Sterile 4x4 in 1 x Per Week/30 Days Discharge Instructions: Apply over primary dressing as directed. Compression Wrap: ThreePress (3 layer compression wrap) 1 x Per Week/30 Days Discharge Instructions: Apply three layer compression as directed. Consults Dermatology - Referral to [facility] Phone [phone] Fax [phone]; Patient to see Dr. Darrin Nipper. Referral r/t squamous cell to bilateral lower legs. - (ICD10 C44.92 - Squamous cell carcinoma of skin, unspecified) Electronic Signature(s) Signed: 07/02/2021 5:27:49 PM By: Kalman Shan DO Entered By: Kalman Shan on 07/02/2021 15:37:34 Prescription 07/02/2021 [place] [facility] -------------------------------------------------------------------------------- Kalman Shan DO Patient Name: Provider: 29-Nov-1925 [id number] Date of Birth: NPI#Corliss Marcus Sex: DEA #: [phone] Phone #: License #: [place] Patient Address: [place], [place] [address] [address], [place] [address] [phone] Allergies Zosyn; atorvastatin; atenolol; clarithromycin; codeine; Levaquin; Macrodantin; oxycodone Provider's Orders Dermatology - ICD10: C46.92 - Referral to [facility] Phone [phone] Fax [phone]; Patient to see Dr. Darrin Nipper. Referral r/t squamous cell to bilateral lower legs. Hand Signature: Date(s): Electronic Signature(s) Signed: 07/02/2021 5:27:49 PM By: Kalman Shan DO Entered By: Kalman Shan on 07/02/2021 15:37:35 -------------------------------------------------------------------------------- Problem List Details Patient Name: Date of Service: [name], [place]. 07/02/2021 1:30 PM Medical Record Number: [id number] Patient Account Number: [id number] Date of  Birth/Sex: Treating RN: 1925/07/31 (88 y.o. [name], [name] Primary Care Provider: [name] Other Clinician: Referring Provider: Treating Provider/Extender: [name] in Treatment: 65 Active Problems ICD-10 Encounter Code Description Active Date MDM Diagnosis C44.92 Squamous cell carcinoma of skin, unspecified 06/11/2020 No Yes L97.812 Non-pressure chronic ulcer of other part of right lower leg with fat layer 06/11/2020 No Yes exposed L97.822 Non-pressure chronic ulcer of other part of left lower leg with fat layer exposed5/23/2022 No Yes I89.0 Lymphedema, not elsewhere classified 01/31/2021 No Yes I87.313 Chronic venous hypertension (idiopathic) with ulcer of bilateral lower extremity 03/21/2021 No Yes Inactive Problems Resolved Problems Electronic Signature(s) Signed: 07/02/2021 5:27:49 PM By: Kalman Shan DO Entered By: Kalman Shan on 07/02/2021 15:33:26 -------------------------------------------------------------------------------- Progress Note Details Patient Name: Date of Service: [name], [place] A. 07/02/2021 1:30 PM Medical Record Number: [id number] Patient Account Number: [id number] Date of Birth/Sex: Treating RN: May 10, 1925 (88 y.o. [name], [name] Primary Care Provider: [name] Other Clinician: Referring Provider: Treating Provider/Extender: [name] in Treatment: 42 Subjective Chief Complaint Information obtained from Patient Bilateral lower extremity wounds that have been biopsied and positive for squamous cell carcinoma Bilateral lower extremity wounds due to chronic venous insufficiency/lymphedema History of Present Illness (HPI) The following HPI elements were documented for the patient's wound: Location: left leg Admission 5/23 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 She states that the wound on the right has been present for a year. The 1 on the left has opened up 1 month ago. She is followed with oncology for this issue as she had biopsies that showed squamous cell carcinoma. She is also seeing radiation oncology for treatment options. She presents today because she would like for her wounds to be healed by [place]. She currently denies signs of infection. 6/1; patient presents for 1 week follow-up. She states she has tolerated the leg wraps well. She states these do not bother her and is happy to continue with them. She is scheduled to see her oncologist today to go over treatment options for the bilateral lower extremity squamous cell carcinoma. Radiation is currently not a recommended option. Patient states she overall feels well. 6/22; patient presents for 3-week follow-up. She has tolerated the wraps well until her last wrap where she states they were uncomfortable. She attributes this to the home health nurse. She denies signs of infection. She has started her first treatment of antibody infusions for her Bilateral lower extremity squamous cell carcinoma. She has no complaints today. 7/21; patient presents for 1 month follow-up. Unfortunately she has not had good experience with her wrap changes with home health. She would like to do her own dressing changes. She continues to do her antibody infusions. She denies signs of infection. 7/28; patient presents for 1 week follow-up. At last clinic visit she was switched to daily dressing changes due to issues with the wrap and home health placing them. Unfortunately she has developed weeping to her legs bilaterally. She would like to be placed in wraps today. She would also like to follow with [place] weekly for wrap changes instead of having home health change them. She denies  signs of infection. 8/4; patient presents for 1 week follow-up. She has tolerated the Kerlix/Coban wraps well. She no longer has weeping to her legs. She took the wrap off 1 day before coming in to be able to take a shower. She has no issues or complaints today. She denies signs of infection. 8/18; patient presents for follow-up. Patient has tolerated the wraps well. She brought her Velcro compression wraps today. She has no issues or complaints today. She had her chemotherapy infusion yesterday without issues. She denies signs of infection. 8/25; patient presents for follow-up. She used her juxta lite compressions for the past week. It is unclear if she is able to put these on correctly since she states she has a hard time getting them to look right. She reports 2 open wounds. She currently denies signs of infection. 9/1; patient presents for 1 week follow-up. She has 1 open wound. She tolerated the compression wraps well. She currently denies signs of infection. 9/8; patient presents for 1 week follow-up. She has 2 open wounds 1 on each leg. She has tolerated the compression wrap well. She currently denies signs of infection. 9/15; patient presents for 1 week follow-up. She now has 3 wounds. 2 on the left and 1 on the right. She continues to tolerate the compression wrap well. She currently denies signs of infection. She has obtained furosemide by her primary care physician and would like to discuss when to take this. 9/22; patient presents for 1 week follow-up. She has scattered wounds on her lower extremities bilaterally. She did take furosemide twice in the past week. She does not recall having to urinate more frequently. She tolerated the 3 layer compression well. She  denies signs of infection. 10/13; patient has not been here recently because of [facility]. Apparently the facility was only putting gauze on her legs. This is a patient I do not normally see. She has a history of squamous cell  carcinoma bilaterally on her anterior lower legs followed for a period of time by Dr. Ronnald Ramp at [facility] dermatology. She is quite convincing that she did not have radiation to her lower legs. It is likely she also has significant chronic venous insufficiency stasis dermatitis. We have been using silver alginate under kerlix Coban. She has obvious open areas medially on the right and areas on the left. She also has areas of extensive dry flaking adherent areas on the right and to a lesser extent on the left anterior. Nodular areas on the left lateral lower leg left posterior calf and right mid calf medially. 10/21; patient presents for follow-up. She has no issues or complaints today. She has tolerated the 3 layer compression well bilaterally. She denies signs of infection. 10/27; patient presents for follow-up. She continues to tolerate the 3 layer compression wrap well. She denies signs of infection. 11/30; patient presents for follow-up. She has no issues or complaints today. 11/10; patient arrived in clinic today for nurse visit accompanied by her daughter from [place]. The daughter had multiple questions so we turned this into doctors visit. Apparently after the last time I saw this woman in October she went to see Dr. Ronnald Ramp but he did not take the wraps off. She previously was treated with Cemiplimab for her squamous cell carcinoma. She did not receive radiation to her legs. I had wanted Dr. Ronnald Ramp to look at this because of the exceptionally damaged skin on her lower legs bilaterally. She has odd looking wounds on the left medial lower leg also extending posteriorly which we have not made a lot of progress on. But she also has a raised hyperkeratotic nodules on the right anterior tibial area plaques on the left medial lower thigh. These are not areas that are under compression. We have been using silver alginate on any open areas Liberal TCA under 3 layer compression. 11/17; patient presents for  follow-up. She had her wraps taken off yesterday for her dermatology appointment. She reports excessive weeping to her legs bilaterally after the wraps were taken off. She currently denies signs of infection. 12/12/2020 upon evaluation today patient appears to be doing about the same in regard to her wounds. She was actually started on Cipro after having biopsies apparently at her dermatology clinic she does not have the results back from the actual biopsy but the culture did return and apparently they placed her on the Cipro she started that just this morning. Other than that her legs appear to be doing okay at this time. 12/1; patient presents for follow-up. She has no issues or complaints today. She has started Lasix 40 mg daily to help with her bilateral leg swelling. 12/8; patient presents for follow-up. She has no issues or complaints today. 12/15; patient presents for 1 week follow-up. She has no issues or complaints today. 12/22; patient presents for follow-up. She has no issues or complaints today. 1/5; patient presents for follow-up. She has no issues or complaints today. She has tolerated the compression wraps well. 1/12; patient presents for follow-up. She is tolerated the compression wrap well and has no issues or complaints today. She denies signs of infection. 1/19; patient presents for follow-up. She took the compression wraps off 1 to 2 days ago to  take a shower and did not have compression wraps replaced. She reports increased blistering throughout her legs bilaterally. She currently denies signs of infection. 1/26; patient presents for follow-up. She has no issues or complaints today. She tolerated the compression wraps well. She has not heard from oncology to schedule an appointment. 2/2; patient presents for follow-up. She has no issues or complaints today. She continues to tolerate the compression wrap well. 2/16; patient presents for follow-up. She has no issues or complaints  today. 2/23; patient presents for follow-up. She has no issues or complaints today. Tomorrow she has an appointment with oncology to look at the suspicious lesion on her right lower extremity. She currently denies signs of infection. 3/2; patient presents for follow-up. She has been using her juxta light compression to the right lower extremity however there has been increased swelling and opening of wounds throughout the legs with weeping. She currently denies signs of infection. She had no issues with the compression wrap to the left lower extremity. 3/16; patient presents for follow-up. She has no issues or complaints today. She states that she has been restarted on infusions for her squamous cell carcinoma of her legs. She has also been increased on Lasix to 40 mg twice daily. She has noticed a decrease in swelling to her legs. 3/23; patient presents for follow-up. She starts her second infusion next week for her squamous cell carcinoma lesions to her legs. She reports no issues with the compression wraps. 3/30; patient presents for follow-up. She has no issues or complaints today. 4/6; patient presents for follow-up. Her left lower extremity wounds have healed. She has her juxta light compression with her today. She has no issues or complaints today. 4/13; this is a patient we actually discharged to juxta lite stockings on the left leg the last time she was here. She came in on Monday for [place] to look at the leg and a nurse visit. She had massive swelling and numerous blisters and wound reopening. We put her back in 3 layer compression although I did not generate a note. Silver alginate on the wounded areas. We have also been doing the same on the right. In the meantime she is obtained her external compression pumps that were previously ordered and she started to use them. She has skin cancers that are obvious on the right leg x2 her appointment with Dr. Jarome Matin of dermatology is on May 5..  She is also receiving weekly chemotherapy for recurrent presumably squamous cell carcinomas apparently has had 2 treatments 4/20; patient presents for follow-up. She has her new juxta lite compressions today. She continues to have open wounds to the left lower extremity. She has follow-up with dermatology in 2 weeks. She continues to have IV infusions for her squamous cell carcinoma lesions on her legs. 4/28; the patient has bilateral lower extremity wounds predominantly on the right lateral upper leg, right anterior lower leg which in itself is probably a recurrent cancer as well as the left lateral lower leg. She has recurrent squamous cell carcinoma currently being treated with an IV infusion. She also has scattered nodules on her upper lower legs and thighs I am not certain of all of this is felt to be malignant as well We have been using silver alginate on the open areas wrapping her legs. She also has her external compression pumps at home but I am not clear that she is going to use these 5/2; patient presents for follow-up. She has not been taking  her diuretics as prescribed. She sees Dr. Dian Situ, dermatology at the end of the week. She tolerated the compression wraps well today. She has her juxta lite compressions. 5/8; patient presents for follow-up. She saw Dr. Dian Situ, dermatology and she has nothing to report on. She has been using her juxta lite compression to the right lower extremity. She has tolerated the compression wrap well in the left lower extremity. She has not been taking her diuretic. She is scheduled to continue her infusions for her squamous cell carcinoma on her legs. 5/18; patient presents for follow-up. We wrapped her in 3 layer compression at last clinic visit. She has no issues or complaints today. 5/22; patient presents for follow-up. She has been wrapped in 3 layer compression bilaterally to her lower extremities over the past week. She has been scratching at the top of the  wrap and picking at her skin. This area has increased warmth and erythema. 5/30; patient presents for follow-up. She completed Keflex with resolution of her symptoms to the right lower extremity. She no longer has increased warmth or erythema. She continues her infusions for her squamous cell carcinoma lesions. We continue to wrap her in 3 layer compression with silver alginate. She currently denies signs of infection. 6/8; the patient went to see Dr. Ronnald Ramp of dermatology 2 days ago unfortunately we do not have his notes. They removed her compression wraps of course but did not adequately replace them. She comes in today with 3 wounds on the right lateral upper calf 1 medially below the obvious squamous cell carcinoma there is a large necrotic wound on the left lateral tibial area on the left and several blisters. The patient remains on Cemiplimad infusions for the squamous cell carcinoma bilaterally. She is not felt to be a good candidate for radiation. Some of the skin changes superiorly in the upper legs bilaterally according to Dr. Ronnald Ramp related to these infusions the same like fleshy nodules to me not blisters 6/13; patient presents for follow-up. We have been using silver alginate under 3 layer compression bilaterally. She has no issues or complaints today. Patient History Information obtained from Patient. Family History Unknown History. Social History Never smoker, Marital Status - Single, Alcohol Use - Never, Drug Use - No History, Caffeine Use - Never. Medical History Eyes Denies history of Cataracts, Optic Neuritis Ear/Nose/Mouth/Throat Denies history of Chronic sinus problems/congestion, Middle ear problems Hematologic/Lymphatic Patient has history of Anemia Denies history of Hemophilia, Human Immunodeficiency Virus, Lymphedema, Sickle Cell Disease Respiratory Denies history of Aspiration, Asthma, Chronic Obstructive Pulmonary Disease (COPD), Pneumothorax, Sleep Apnea,  Tuberculosis Cardiovascular Patient has history of Arrhythmia - Atrial Flutter, A fibb, Congestive Heart Failure, Hypertension Denies history of Angina, Coronary Artery Disease, Deep Vein Thrombosis, Hypotension, Myocardial Infarction, Peripheral Arterial Disease, Peripheral Venous Disease, Phlebitis, Vasculitis Gastrointestinal Patient has history of Colitis Denies history of Cirrhosis , Crohnoos, Hepatitis A, Hepatitis B, Hepatitis C Endocrine Denies history of Type I Diabetes, Type II Diabetes Genitourinary Denies history of End Stage Renal Disease Immunological Denies history of Lupus Erythematosus, Raynaudoos, Scleroderma Integumentary (Skin) Denies history of History of Burn Musculoskeletal Patient has history of Osteoarthritis Denies history of Gout, Rheumatoid Arthritis, Osteomyelitis Neurologic Denies history of Dementia, Neuropathy, Quadriplegia, Paraplegia, Seizure Disorder Oncologic Denies history of Received Chemotherapy, Received Radiation Hospitalization/Surgery History - removal of rod left leg. 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 Vitals  Time Taken: 1:35 PM, Height: 65 in, Weight: 163 lbs, BMI: 27.1, Temperature: 98 F, Pulse: 87 bpm, Respiratory Rate: 20 breaths/min, Blood Pressure: 114/71 mmHg. Cardiovascular 2+ dorsalis pedis/posterior tibialis pulses. Psychiatric pleasant and cooperative. General Notes: Multiple small scattered open wounds to her lower extremities bilaterally limited to skin breakdown. 3 areas suspicious for malignancy. Good edema control. No weeping noted. No surrounding signs of infection. Integumentary (Hair, Skin) Wound #20 status is Open. Original cause of wound was Gradually Appeared. The date acquired  was: 06/25/2021. The wound is located on the Right,Circumferential Lower Leg. The wound measures 18cm length x 32.5cm width x 0.1cm depth; 459.458cm^2 area and 45.946cm^3 volume. There is Fat Layer (Subcutaneous Tissue) exposed. There is no tunneling or undermining noted. There is a large amount of serosanguineous drainage noted. The wound margin is distinct with the outline attached to the wound base. There is large (67-100%) pink granulation within the wound bed. Wound #7RR status is Open. Original cause of wound was Blister. The date acquired was: 04/20/2020. The wound has been in treatment 55 weeks. The wound is located on the Left,Circumferential Lower Leg. The wound measures 10cm length x 22.5cm width x 0.1cm depth; 176.715cm^2 area and 17.671cm^3 volume. There is Fat Layer (Subcutaneous Tissue) exposed. There is no tunneling or undermining noted. There is a medium amount of serosanguineous drainage noted. The wound margin is distinct with the outline attached to the wound base. There is large (67-100%) red, pink granulation within the wound bed. There is no necrotic tissue within the wound bed. Assessment Active Problems ICD-10 Squamous cell carcinoma of skin, unspecified Non-pressure chronic ulcer of other part of right lower leg with fat layer exposed Non-pressure chronic ulcer of other part of left lower leg with fat layer exposed Lymphedema, not elsewhere classified Chronic venous hypertension (idiopathic) with ulcer of bilateral lower extremity Patient's wounds are stable. I recommended continuing silver alginate under compression therapy. Patient follows with Dr. Ronnald Ramp, dermatology. She would like a second opinion to look at her lesions. She would like to be referred to a different dermatology group. We will go ahead and set this up. Unfortunately patient has a history of squamous cell carcinoma to her legs bilaterally confirmed on biopsy. She has had 1 round of IV immunotherapy which  eradicated the lesions however these returned once the infusions stopped. She is now on a second round of Libtayo. Since starting infusions she has developed increased swelling and ulcers secondary to her venous insufficiency/Lymphedema. We have been wrapping her for a long time now. She is on Lasix due to this. At this time she is a palliative care case we are just controlling symptoms. Procedures Wound #20 Pre-procedure diagnosis of Wound #20 is a Lymphedema located on the Right,Circumferential Lower Leg . There was a Three Layer Compression Therapy Procedure by Deon Pilling, RN. Post procedure Diagnosis Wound #20: Same as Pre-Procedure Wound #7RR Pre-procedure diagnosis of Wound #7RR is a Malignant Wound located on the Left,Circumferential Lower Leg . There was a Three Layer Compression Therapy Procedure by Deon Pilling, RN. Post procedure Diagnosis Wound #7RR: Same as Pre-Procedure Plan Follow-up Appointments: Return Appointment in 1 week. - Dr. Heber Pickerington and Allayne Butcher, Room 9 07/08/2021 130pm Monday 145pm Dr. Heber  and Wink, Room 8 07/16/2021 130pm Tuesday 130pm Other: - Dermatology specialists will call you to schedule appt time. Bathing/ Shower/ Hygiene: May shower with protection but do not get wound dressing(s) wet. - Use cast protector Edema Control - Lymphedema / SCD / Other: Lymphedema Pumps.  Use Lymphedema pumps on leg(s) 2-3 times a day for 45-60 minutes. If wearing any wraps or hose, do not remove them. Continue exercising as instructed. - use one hour each time twice a day. Elevate legs to the level of the heart or above for 30 minutes daily and/or when sitting, a frequency of: - elevate the legs throughout the day heart level if possible. Avoid standing for long periods of time. Exercise regularly Additional Orders / Instructions: Follow Nutritious Diet Consults ordered were: Dermatology - Referral to Dermatology Specialists Phone [phone] Fax [phone]; Patient  to see Dr. Darrin Nipper. Referral r/t squamous cell to bilateral lower legs. WOUND #20: - Lower Leg Wound Laterality: Right, Circumferential Cleanser: Soap and Water 1 x Per Week/30 Days Discharge Instructions: May shower and wash wound with dial antibacterial soap and water prior to dressing change. Cleanser: Wound Cleanser 1 x Per Week/30 Days Discharge Instructions: Cleanse the wound with wound cleanser prior to applying a clean dressing using gauze sponges, not tissue or cotton balls. Peri-Wound Care: Sween Lotion (Moisturizing lotion) 1 x Per Week/30 Days Discharge Instructions: Apply moisturizing lotion as directed Prim Dressing: KerraCel Ag Gelling Fiber Dressing, 4x5 in (silver alginate) 1 x Per Week/30 Days ary Discharge Instructions: Apply silver alginate to wound bed as instructed Secondary Dressing: ABD Pad, 8x10 1 x Per Week/30 Days Discharge Instructions: Apply over primary dressing as directed. Secondary Dressing: Woven Gauze Sponge, Non-Sterile 4x4 in 1 x Per Week/30 Days Discharge Instructions: Apply over primary dressing as directed. Com pression Wrap: ThreePress (3 layer compression wrap) 1 x Per Week/30 Days Discharge Instructions: Apply three layer compression as directed. WOUND #7RR: - Lower Leg Wound Laterality: Left, Circumferential Cleanser: Soap and Water 1 x Per Week/30 Days Discharge Instructions: May shower and wash wound with dial antibacterial soap and water prior to dressing change. Cleanser: Wound Cleanser 1 x Per Week/30 Days Discharge Instructions: Cleanse the wound with wound cleanser prior to applying a clean dressing using gauze sponges, not tissue or cotton balls. Peri-Wound Care: Sween Lotion (Moisturizing lotion) 1 x Per Week/30 Days Discharge Instructions: Apply moisturizing lotion as directed Prim Dressing: KerraCel Ag Gelling Fiber Dressing, 4x5 in (silver alginate) 1 x Per Week/30 Days ary Discharge Instructions: Apply silver alginate to wound bed as  instructed Secondary Dressing: ABD Pad, 8x10 1 x Per Week/30 Days Discharge Instructions: Apply over primary dressing as directed. Secondary Dressing: Woven Gauze Sponge, Non-Sterile 4x4 in 1 x Per Week/30 Days Discharge Instructions: Apply over primary dressing as directed. Com pression Wrap: ThreePress (3 layer compression wrap) 1 x Per Week/30 Days Discharge Instructions: Apply three layer compression as directed. 1. Silver alginate under 3 layer compression 2. Follow-up in 1 week Electronic Signature(s) Signed: 07/02/2021 5:27:49 PM By: Kalman Shan DO Entered By: Kalman Shan on 07/02/2021 15:42:45 -------------------------------------------------------------------------------- HxROS Details Patient Name: Date of Service: [name], [place]. 07/02/2021 1:30 PM Medical Record Number: [id number] Patient Account Number: [id number] Date of Birth/Sex: Treating RN: 1926/01/17 (88 y.o. [name] Primary Care Provider: [name] Other Clinician: Referring Provider: Treating Provider/Extender: [name] in Treatment: 85 Information Obtained From Patient Eyes Medical History: Negative for: Cataracts; Optic Neuritis Ear/Nose/Mouth/Throat Medical History: Negative for: Chronic sinus problems/congestion; Middle ear problems Hematologic/Lymphatic Medical History: Positive for: Anemia Negative for: Hemophilia; Human Immunodeficiency Virus; Lymphedema; Sickle Cell Disease Respiratory Medical History: Negative for: Aspiration; Asthma; Chronic Obstructive Pulmonary Disease (COPD); Pneumothorax; Sleep Apnea; Tuberculosis Cardiovascular Medical History: Positive for: Arrhythmia - Atrial Flutter, A  fibb; Congestive Heart Failure; Hypertension Negative for: Angina; Coronary Artery Disease; Deep Vein Thrombosis; Hypotension; Myocardial Infarction; Peripheral Arterial Disease; Peripheral Venous Disease; Phlebitis; Vasculitis Past Medical  History Notes: hyperlipidemia Gastrointestinal Medical History: Positive for: Colitis Negative for: Cirrhosis ; Crohns; Hepatitis A; Hepatitis B; Hepatitis C Endocrine Medical History: Negative for: Type I Diabetes; Type II Diabetes Past Medical History Notes: hypothyroidism Genitourinary Medical History: Negative for: End Stage Renal Disease Immunological Medical History: Negative for: Lupus Erythematosus; Raynauds; Scleroderma Integumentary (Skin) Medical History: Negative for: History of Burn Musculoskeletal Medical History: Positive for: Osteoarthritis Negative for: Gout; Rheumatoid Arthritis; Osteomyelitis Neurologic Medical History: Negative for: Dementia; Neuropathy; Quadriplegia; Paraplegia; Seizure Disorder Past Medical History Notes: lumbar spindylolysis Oncologic Medical History: Negative for: Received Chemotherapy; Received Radiation Past Medical History Notes: BLE squamous ceel carcionoma Immunizations Pneumococcal Vaccine: Received Pneumococcal Vaccination: Yes Received Pneumococcal Vaccination On or After 60th Birthday: No Implantable Devices None Hospitalization / Surgery History Type of Hospitalization/Surgery removal of rod left leg Family and Social History Unknown History: Yes; Never smoker; Marital Status - Single; Alcohol Use: Never; Drug Use: No History; Caffeine Use: Never; Financial Concerns: No; Food, Clothing or Shelter Needs: No; Support System Lacking: No; Transportation Concerns: No Electronic Signature(s) Signed: 07/02/2021 5:07:46 PM By: Deon Pilling RN, BSN Signed: 07/02/2021 5:27:49 PM By: Kalman Shan DO Entered By: Kalman Shan on 07/02/2021 15:36:02 -------------------------------------------------------------------------------- SuperBill Details Patient Name: Date of Service: [name], [name] 07/02/2021 Medical Record Number: [id number] Patient Account Number: [id number] Date of Birth/Sex: Treating RN: [birth year] (88  y.o. [name], [name] Primary Care Provider: [name] Other Clinician: Referring Provider: Treating Provider/Extender: [name] in Treatment: 37 Diagnosis Coding ICD-10 Codes Code Description C44.92 Squamous cell carcinoma of skin, unspecified L97.812 Non-pressure chronic ulcer of other part of right lower leg with fat layer exposed L97.822 Non-pressure chronic ulcer of other part of left lower leg with fat layer exposed I89.0 Lymphedema, not elsewhere classified I87.313 Chronic venous hypertension (idiopathic) with ulcer of bilateral lower extremity Facility Procedures CPT4: Code [id number] Description: 81 BILATERAL: Application of multi-layer venous compression system; leg (below knee), including ankle and t. Modifier: Quantity: 1 Physician Procedures : CPT4 Code Description Modifier [id number] - WC PHYS LEVEL 3 - EST PT ICD-10 Diagnosis Description W46.659 Non-pressure chronic ulcer of other part of right lower leg with fat layer exposed L97.822 Non-pressure chronic ulcer of other part of left  lower leg with fat layer exposed I87.313 Chronic venous hypertension (idiopathic) with ulcer of bilateral lower extremity C44.92 Squamous cell carcinoma of skin, unspecified Quantity: 1 Electronic Signature(s) Signed: 07/02/2021 5:27:49 PM By: Kalman Shan DO Entered By: Kalman Shan on 07/02/2021 15:44:30

## 2021-07-02 NOTE — Progress Notes (Signed)
[name] ([id number]) , Visit Report for 06/06/2021 Arrival Information Details Patient Name: Date of Service: [name], [place]. 06/06/2021 1:30 PM Medical Record Number: [id number] Patient Account Number: [id number] Date of Birth/Sex: Treating RN: 11/21/1925 (88 y.o. [name], [name] Primary Care Lucerito Rosinski: [name] Other Clinician: Referring Loghan Subia: Treating Sheretta Grumbine/Extender: [name] in Treatment: 65 Visit Information History Since Last Visit Added or deleted any medications: No Patient Arrived: [place] Any new allergies or adverse reactions: No Arrival Time: 13:38 Had a fall or experienced change in No Accompanied By: self activities of daily living that may affect Transfer Assistance: None risk of falls: Patient Identification Verified: Yes Signs or symptoms of abuse/neglect since last visito No Secondary Verification Process Completed: Yes Hospitalized since last visit: No Patient Requires Transmission-Based No Implantable device outside of the clinic excluding No Precautions: cellular tissue based products placed in the center Patient Has Alerts: Yes since last visit: Patient Alerts: R ABI = Non compressible Has Dressing in Place as Prescribed: Yes L ABI = Non compressible Has Compression in Place as Prescribed: Yes Pain Present Now: No Electronic Signature(s) Signed: 07/02/2021 8:46:54 AM By: Erenest Blank Entered By: Erenest Blank on 06/06/2021 13:38:33 -------------------------------------------------------------------------------- Compression Therapy Details Patient Name: Date of Service: [name], [name] 06/06/2021 1:30 PM Medical Record Number: [id number] Patient Account Number: [id number] Date of Birth/Sex: Treating RN: [birth year] (88 y.o. [name] Primary Care Carianne Taira: [name] Other Clinician: Referring Joncarlos Atkison: Treating Cassidee Deats/Extender: [name] in Treatment: 70 Compression Therapy Performed for Wound Assessment: Wound #[id number] Left,Circumferential Lower Leg Performed By: Clinician [name] Hammock, RN Compression Type: Three Layer Post Procedure Diagnosis Same as Pre-procedure Electronic Signature(s) Signed: 06/06/2021 4:40:18 PM By: [name] Hammock RN Entered By: [name] on 06/06/2021 14:25:52 -------------------------------------------------------------------------------- Compression Therapy Details Patient Name: Date of Service: [name], [place]. 06/06/2021 1:30 PM Medical Record Number: [id number] Patient Account Number: [id number] Date of Birth/Sex: Treating RN: 1925-10-26 (88 y.o. [name] Primary Care Versa Craton: [name] Other Clinician: Referring Asjia Berrios: Treating Arno Cullers/Extender: [name] in Treatment: 30 Compression Therapy Performed for Wound Assessment: NonWound Condition Lymphedema - Bell Leg Performed By: Clinician [name] Hammock, RN Compression Type: Three Layer Post Procedure Diagnosis Same as Pre-procedure Electronic Signature(s) Signed: 06/06/2021 4:40:18 PM By: [name] Hammock RN Entered By: [name] on 06/06/2021 14:26:09 -------------------------------------------------------------------------------- Lower Extremity Assessment Details Patient Name: Date of Service: [name], [place]. 06/06/2021 1:30 PM Medical Record Number: [id number] Patient Account Number: [id number] Date of Birth/Sex: Treating RN: 1925/02/01 (88 y.o. [name] Primary Care Paulette [place]: [name] Other Clinician: Referring Winta Barcelo: Treating [name]/Extender: [name] in Treatment: 51 Edema Assessment Assessed: [Left: No] [Bell: No] Edema: [Left: Yes] [Bell: Yes] Calf Left: Bell: Point of Measurement: 30 cm From Medial Instep 41.3 cm 38 cm Ankle Left: Bell: Point of  Measurement: 9 cm From Medial Instep 23 cm 24 cm Vascular Assessment Pulses: Dorsalis Pedis Palpable: [Left:Yes] Electronic Signature(s) Signed: 06/11/2021 4:34:51 PM By: [name] Pilling RN, [name] Signed: 07/02/2021 8:46:54 AM By: Erenest Blank Entered By: Erenest Blank on 06/06/2021 14:12:03 -------------------------------------------------------------------------------- Multi Wound Chart Details Patient Name: Date of Service: [name], [name] 06/06/2021 1:30 PM Medical Record Number: [id number] Patient Account Number: [id number] Date of Birth/Sex: Treating RN: [birth year] (88 y.o. [name] Primary Care Ladonna Vanorder: [name] Other Clinician: Referring Aqua Denslow: Treating [name]/Extender: [name] in Treatment:  51 Vital Signs Height(in): 65 Pulse(bpm): 79 Weight(lbs): 163 Blood Pressure(mmHg): 107/65 Body Mass Index(BMI): 27.1 Temperature(F): 98 Respiratory Rate(breaths/min): 18 Photos: [N/A:N/A] Left, Circumferential Lower Leg N/A N/A Wound Location: Blister N/A N/A Wounding Event: Malignant Wound N/A N/A Primary Etiology: Anemia, Arrhythmia, Congestive Heart N/A N/A Comorbid History: Failure, Hypertension, Colitis, Osteoarthritis 04/20/2020 N/A N/A Date Acquired: 3 N/A N/A Weeks of Treatment: Open N/A N/A Wound Status: Yes N/A N/A Wound Recurrence: Yes N/A N/A Clustered Wound: 2 N/A N/A Clustered Quantity: 7.1x3.7x0.1 N/A N/A Measurements L x W x D (cm) 20.632 N/A N/A A (cm) : rea 2.063 N/A N/A Volume (cm) : -571.80% N/A N/A % Reduction in Area: -572.00% N/A N/A % Reduction in Volume: Full Thickness Without Exposed N/A N/A Classification: Support Structures Medium N/A N/A Exudate Amount: Serosanguineous N/A N/A Exudate Type: red, brown N/A N/A Exudate Color: Distinct, outline attached N/A N/A Wound Margin: Large (67-100%) N/A N/A Granulation Amount: Red, Pink N/A N/A Granulation  Quality: None Present (0%) N/A N/A Necrotic Amount: Fat Layer (Subcutaneous Tissue): Yes N/A N/A Exposed Structures: Fascia: No Tendon: No Muscle: No Joint: No Bone: No Large (67-100%) N/A N/A Epithelialization: Treatment Notes Electronic Signature(s) Signed: 06/06/2021 3:07:31 PM By: [name] Shan DO Signed: 06/11/2021 4:34:51 PM By: [name] Pilling RN, [name] Entered By: [name] on 06/06/2021 14:24:31 -------------------------------------------------------------------------------- Pain Assessment Details Patient Name: Date of Service: [name], [name] 06/06/2021 1:30 PM Medical Record Number: [id number] Patient Account Number: [id number] Date of Birth/Sex: Treating RN: 1925/02/03 (88 y.o. [name] Primary Care Matai Carpenito: [name] Other Clinician: Referring Jamiah Recore: Treating Gissela Bloch/Extender: [name] in Treatment: 70 Active Problems Location of Pain Severity and Description of Pain Patient Has Paino No Site Locations Pain Management and Medication Current Pain Management: Electronic Signature(s) Signed: 06/11/2021 4:34:51 PM By: [name] Pilling RN, [name] Signed: 07/02/2021 8:46:54 AM By: Erenest Blank Entered By: Erenest Blank on 06/06/2021 13:39:03 -------------------------------------------------------------------------------- Wound Assessment Details Patient Name: Date of Service: [name], [place]. 06/06/2021 1:30 PM Medical Record Number: [id number] Patient Account Number: [id number] Date of Birth/Sex: Treating RN: [birth year] (88 y.o. [name], [name] Primary Care Zamari Vea: [name] Other Clinician: Referring Ensley Blas: Treating [name]/Extender: [name] in Treatment: 5 Wound Status Wound Number: [id number] Primary Malignant Wound Etiology: Wound Location: Left, Circumferential Lower Leg Wound Open Wounding Event: Blister Status: Date Acquired:  04/20/2020 Comorbid Anemia, Arrhythmia, Congestive Heart Failure, Hypertension, Weeks Of Treatment: 51 History: Colitis, Osteoarthritis Clustered Wound: Yes Photos Wound Measurements Length: (cm) 7.1 Width: (cm) 3.7 Depth: (cm) 0.1 Clustered Quantity: 2 Area: (cm) 20.632 Volume: (cm) 2.063 % Reduction in Area: -571.8% % Reduction in Volume: -572% Epithelialization: Large (67-100%) Tunneling: No Undermining: No Wound Description Classification: Full Thickness Without Exposed Support Structures Wound Margin: Distinct, outline attached Exudate Amount: Medium Exudate Type: Serosanguineous Exudate Color: red, brown Foul Odor After Cleansing: No Slough/Fibrino No Wound Bed Granulation Amount: Large (67-100%) Exposed Structure Granulation Quality: Red, Pink Fascia Exposed: No Necrotic Amount: None Present (0%) Fat Layer (Subcutaneous Tissue) Exposed: Yes Tendon Exposed: No Muscle Exposed: No Joint Exposed: No Bone Exposed: No Electronic Signature(s) Signed: 06/11/2021 4:34:51 PM By: [name] Pilling RN, [name] Signed: 07/02/2021 8:46:54 AM By: Erenest Blank Entered By: Erenest Blank on 06/06/2021 14:10:14 -------------------------------------------------------------------------------- Vitals Details Patient Name: Date of Service: [name], [place]. 06/06/2021 1:30 PM Medical Record Number: [id number] Patient Account Number: [id number] Date of Birth/Sex: Treating RN: 09/15/1925 (88 y.o. [name] Primary Care Duey Liller: [name]  Other Clinician: Referring Harmon Bommarito: Treating Saira Kramme/Extender: [name] in Treatment: 59 Vital Signs Time Taken: 13:38 Temperature (F): 98 Height (in): 65 Pulse (bpm): 79 Weight (lbs): 163 Respiratory Rate (breaths/min): 18 Body Mass Index (BMI): 27.1 Blood Pressure (mmHg): 107/65 Reference Range: 80 - 120 mg / dl Electronic Signature(s) Signed: 07/02/2021 8:46:54 AM By: Erenest Blank Entered By: Erenest Blank on 06/06/2021 13:38:55

## 2021-07-02 NOTE — Progress Notes (Signed)
[name] ([id number]) , Visit Report for 05/21/2021 Arrival Information Details Patient Name: Date of Service: [name], [name]. 05/21/2021 2:15 PM Medical Record Number: [id number] Patient Account Number: [id number] Date of Birth/Sex: Treating RN: Oct 18, 1925 (88 y.o. [name], [name] Primary Care [name]: [name] Other Clinician: Referring [name]: Treating [name]/Extender: [name] in Treatment: 34 Visit Information History Since Last Visit Added or deleted any medications: No Patient Arrived: Walker Any new allergies or adverse reactions: No Arrival Time: 14:15 Had a fall or experienced change in No Accompanied By: self activities of daily living that may affect Transfer Assistance: None risk of falls: Patient Identification Verified: Yes Signs or symptoms of abuse/neglect since last visito No Secondary Verification Process Completed: Yes Hospitalized since last visit: No Patient Requires Transmission-Based No Implantable device outside of the clinic excluding No Precautions: cellular tissue based products placed in the center Patient Has Alerts: Yes since last visit: Patient Alerts: R ABI = Non compressible Has Dressing in Place as Prescribed: Yes L ABI = Non compressible Pain Present Now: No Electronic Signature(s) Signed: 05/22/2021 8:30:04 AM By: [name] Entered By: [name] on 05/21/2021 14:15:38 -------------------------------------------------------------------------------- Compression Therapy Details Patient Name: Date of Service: [name], [name] 05/21/2021 2:15 PM Medical Record Number: [id number] Patient Account Number: [id number] Date of Birth/Sex: Treating RN: Mar 10, 1925 (88 y.o. [name] Primary Care [name]: [name] Other Clinician: Referring [name]: Treating [name]/Extender: [name] in Treatment: 1 Compression Therapy  Performed for Wound Assessment: Wound #7RR Left,Circumferential Lower Leg Performed By: Clinician [name] Jackson, RN Compression Type: Three Layer Post Procedure Diagnosis Same as Pre-procedure Electronic Signature(s) Signed: 05/21/2021 5:29:35 PM By: [name] Entered By: [name] on 05/21/2021 14:45:41 -------------------------------------------------------------------------------- Encounter Discharge Information Details Patient Name: Date of Service: [name], [name] [place]. 05/21/2021 2:15 PM Medical Record Number: [id number] Patient Account Number: [id number] Date of Birth/Sex: Treating RN: 05/18/1925 (88 y.o. [name] Primary Care [name]: [name] Other Clinician: Referring [name]: Treating [name]/Extender: [name] in Treatment: 17 Encounter Discharge Information Items Discharge Condition: Stable Ambulatory Status: Walker Discharge Destination: Home Transportation: Other Schedule Follow-up Appointment: Yes Clinical Summary of Care: Provided on 05/21/2021 Form Type Recipient Paper Patient Patient Electronic Signature(s) Signed: 05/21/2021 5:08:04 PM By: [name] Entered By: [name] on 05/21/2021 17:08:04 -------------------------------------------------------------------------------- Lower Extremity Assessment Details Patient Name: Date of Service: [name], [name] 05/21/2021 2:15 PM Medical Record Number: [id number] Patient Account Number: [id number] Date of Birth/Sex: Treating RN: 1925-08-21 (88 y.o. [name] Primary Care Mikhai Bienvenue: [name] Other Clinician: Referring [name]: Treating [name]/Extender: [name] in Treatment: 93 Edema Assessment Assessed: [Left: No] [Bell: No] Edema: [Left: Yes] [Bell: Yes] Calf Left: Bell: Point of Measurement: 30 cm From Medial Instep 41.1 cm 42 cm Ankle Left: Bell: Point of Measurement: 9 cm  From Medial Instep 23 cm 24.1 cm Vascular Assessment Pulses: Dorsalis Pedis Palpable: [Left:Yes] [Bell:Yes] Electronic Signature(s) Signed: 05/23/2021 5:16:25 PM By: [name] Pilling RN, BSN Signed: 07/02/2021 8:46:54 AM By: [name] Entered By: [name] on 05/21/2021 14:35:08 -------------------------------------------------------------------------------- Multi Wound Chart Details Patient Name: Date of Service: [name], [name] 05/21/2021 2:15 PM Medical Record Number: [id number] Patient Account Number: [id number] Date of Birth/Sex: Treating RN: [birth year] (88 y.o. [name] Primary Care [name]: [name] Other Clinician: Referring [name]: Treating [name]/Extender: [name] in Treatment: 33 Vital Signs  Height(in): 65 Pulse(bpm): 80 Weight(lbs): 163 Blood Pressure(mmHg): 113/70 Body Mass Index(BMI): 27.1 Temperature(F): 97.7 Respiratory Rate(breaths/min): 20 Photos: [N/A:N/A] Bell, Circumferential Lower Leg Left, Circumferential Lower Leg N/A Wound Location: Blister Blister N/A Wounding Event: Venous Leg Ulcer Malignant Wound N/A Primary Etiology: Anemia, Arrhythmia, Congestive Heart Anemia, Arrhythmia, Congestive Heart N/A Comorbid History: Failure, Hypertension, Colitis, Failure, Hypertension, Colitis, Osteoarthritis Osteoarthritis 02/06/2021 04/20/2020 N/A Date Acquired: 14 49 N/A Weeks of Treatment: Converted Open N/A Wound Status: No Yes N/A Wound Recurrence: Yes Yes N/A Clustered Wound: N/A 3 N/A Clustered Quantity: 0x0x0 7x2.5x0.1 N/A Measurements L x W x D (cm) 0 13.744 N/A A (cm) : rea 0 1.374 N/A Volume (cm) : 100.00% -347.50% N/A % Reduction in Area: 100.00% -347.60% N/A % Reduction in Volume: Full Thickness Without Exposed Full Thickness Without Exposed N/A Classification: Support Structures Support Structures N/A Medium N/A Exudate A mount: N/A Serosanguineous  N/A Exudate Type: N/A red, brown N/A Exudate Color: N/A Distinct, outline attached N/A Wound Margin: N/A Large (67-100%) N/A Granulation A mount: N/A Red, Pink N/A Granulation Quality: N/A None Present (0%) N/A Necrotic A mount: N/A Large (67-100%) N/A Epithelialization: Area is cancerous not wound per MD N/A N/A Assessment Notes: N/A Compression Therapy N/A Procedures Performed: Treatment Notes Electronic Signature(s) Signed: 05/21/2021 3:46:21 PM By: Kalman Shan DO Signed: 05/23/2021 5:16:25 PM By: [name] Pilling RN, BSN Entered By: Kalman Shan on 05/21/2021 15:14:41 -------------------------------------------------------------------------------- Multi-Disciplinary Care Plan Details Patient Name: Date of Service: [name], Shanyia A. 05/21/2021 2:15 PM Medical Record Number: [id number] Patient Account Number: [id number] Date of Birth/Sex: Treating RN: September 25, 1925 (88 y.o. [name] Primary Care Iriana Artley: Other Clinician: [name] Referring Rose Hegner: Treating Gionni Freese/Extender: [name] in Treatment: 87 Multidisciplinary Care Plan reviewed with physician Active Inactive Venous Leg Ulcer Nursing Diagnoses: Knowledge deficit related to disease process and management Potential for venous Insuffiency (use before diagnosis confirmed) Goals: Patient will maintain optimal edema control Date Initiated: 11/01/2020 Target Resolution Date: 06/11/2021 Goal Status: Active Interventions: Assess peripheral edema status every visit. Compression as ordered Provide education on venous insufficiency Treatment Activities: Therapeutic compression applied : 11/01/2020 Notes: 12/27/20: Edema control ongoing 01/24/21: Edema control continues, using 3 layer compression Wound/Skin Impairment Nursing Diagnoses: Impaired tissue integrity Knowledge deficit related to ulceration/compromised skin integrity Goals: Patient/caregiver will  verbalize understanding of skin care regimen Date Initiated: 06/11/2020 Target Resolution Date: 06/11/2021 Goal Status: Active Interventions: Assess patient/caregiver ability to obtain necessary supplies Assess patient/caregiver ability to perform ulcer/skin care regimen upon admission and as needed Assess ulceration(s) every visit Provide education on ulcer and skin care Notes: 10/11/20: Wound care regimen ongoing. 12/27/20: Wound care continues Electronic Signature(s) Signed: 05/21/2021 5:29:35 PM By: [name] Entered By: [name] on 05/21/2021 14:43:30 -------------------------------------------------------------------------------- Pain Assessment Details Patient Name: Date of Service: [name], [place]. 05/21/2021 2:15 PM Medical Record Number: [id number] Patient Account Number: [id number] Date of Birth/Sex: Treating RN: 1925-03-14 (88 y.o. [name] Primary Care Daneille Desilva: [name] Other Clinician: Referring Jaya Lapka: Treating Adelyne Marchese/Extender: [name] in Treatment: 63 Active Problems Location of Pain Severity and Description of Pain Patient Has Paino No Site Locations Pain Management and Medication Current Pain Management: Electronic Signature(s) Signed: 05/22/2021 8:30:04 AM By: [name] Signed: 05/23/2021 5:16:25 PM By: [name] Pilling RN, BSN Entered By: [name] on 05/21/2021 14:15:59 -------------------------------------------------------------------------------- Patient/Caregiver Education Details Patient Name: Date of Service: [name], [place] 5/2/2023andnbsp2:15 PM Medical Record Number: [id number] Patient Account Number: [id number] Date of  Birth/Gender: Treating RN: [birth year] (88 y.o. [name] Primary Care Physician: [name] Other Clinician: Referring Physician: Treating Physician/Extender: [name] in Treatment: 44 Education  Assessment Education Provided To: Patient Education Topics Provided Venous: Methods: Explain/Verbal, Printed Responses: State content correctly Wound/Skin Impairment: Methods: Explain/Verbal, Printed Responses: State content correctly Electronic Signature(s) Signed: 05/21/2021 5:29:35 PM By: [name] Entered By: [name] on 05/21/2021 14:45:10 -------------------------------------------------------------------------------- Wound Assessment Details Patient Name: Date of Service: [name], Niyla A. 05/21/2021 2:15 PM Medical Record Number: [id number] Patient Account Number: [id number] Date of Birth/Sex: Treating RN: 09/06/1925 (88 y.o. [name], [name] Primary Care Lariyah Shetterly: [name] Other Clinician: Referring Vergene Marland: Treating Wyndi Northrup/Extender: [name] in Treatment: 1 Wound Status Wound Number: [id number] Primary Venous Leg Ulcer Etiology: Wound Location: Bell, Circumferential Lower Leg Wound Converted Wounding Event: Blister Status: Date Acquired: 02/06/2021 Comorbid Anemia, Arrhythmia, Congestive Heart Failure, Hypertension, Weeks Of Treatment: 14 History: Colitis, Osteoarthritis Clustered Wound: Yes Photos Wound Measurements Length: (cm) Width: (cm) Depth: (cm) Area: (cm) Volume: (cm) 0 % Reduction in Area: 100% 0 % Reduction in Volume: 100% 0 0 0 Wound Description Classification: Full Thickness Without Exposed Support Structur es Assessment Notes Area is cancerous not wound per MD Electronic Signature(s) Signed: 05/21/2021 5:29:35 PM By: [name] Signed: 05/23/2021 5:16:25 PM By: [name] Pilling RN, BSN Entered By: [name] on 05/21/2021 14:42:15 -------------------------------------------------------------------------------- Wound Assessment Details Patient Name: Date of Service: [name], [name] 05/21/2021 2:15 PM Medical Record Number: [id number] Patient Account Number: [id number] Date of  Birth/Sex: Treating RN: 1925/02/13 (88 y.o. [name], [name] Primary Care Eneida Evers: [name] Other Clinician: Referring Selso Mannor: Treating Taylour Lietzke/Extender: [name] in Treatment: 71 Wound Status Wound Number: [id number] Primary Malignant Wound Etiology: Wound Location: Left, Circumferential Lower Leg Wound Open Wounding Event: Blister Status: Date Acquired: 04/20/2020 Comorbid Anemia, Arrhythmia, Congestive Heart Failure, Hypertension, Weeks Of Treatment: 49 History: Colitis, Osteoarthritis Clustered Wound: Yes Photos Wound Measurements Length: (cm) 7 Width: (cm) 2.5 Depth: (cm) 0.1 Clustered Quantity: 3 Area: (cm) 13 Volume: (cm) 1. % Reduction in Area: -347.5% % Reduction in Volume: -347.6% Epithelialization: Large (67-100%) Tunneling: No .744 Undermining: No 374 Wound Description Classification: Full Thickness Without Exposed Support Structures Wound Margin: Distinct, outline attached Exudate Amount: Medium Exudate Type: Serosanguineous Exudate Color: red, brown Foul Odor After Cleansing: No Slough/Fibrino No Wound Bed Granulation Amount: Large (67-100%) Exposed Structure Granulation Quality: Red, Pink Fascia Exposed: No Necrotic Amount: None Present (0%) Fat Layer (Subcutaneous Tissue) Exposed: Yes Tendon Exposed: No Muscle Exposed: No Joint Exposed: No Bone Exposed: No Electronic Signature(s) Signed: 05/23/2021 5:16:25 PM By: [name] Pilling RN, BSN Signed: 07/02/2021 8:46:54 AM By: [name] Entered By: [name] on 05/21/2021 14:36:14 -------------------------------------------------------------------------------- Vitals Details Patient Name: Date of Service: [name], [name] 05/21/2021 2:15 PM Medical Record Number: [id number] Patient Account Number: [id number] Date of Birth/Sex: Treating RN: March 27, 1925 (88 y.o. [name], [name] Primary Care Markasia Carrol: [name] Other Clinician: Referring  Breckin Zafar: Treating Wynona Duhamel/Extender: [name] in Treatment: 97 Vital Signs Time Taken: 14:15 Temperature (F): 97.7 Height (in): 65 Pulse (bpm): 80 Weight (lbs): 163 Respiratory Rate (breaths/min): 20 Body Mass Index (BMI): 27.1 Blood Pressure (mmHg): 113/70 Reference Range: 80 - 120 mg / dl Electronic Signature(s) Signed: 05/22/2021 8:30:04 AM By: [name] Entered By: [name] on 05/21/2021 14:15:53

## 2021-07-02 NOTE — Progress Notes (Signed)
[name] ([id number]) , Visit Report for 06/27/2021 Arrival Information Details Patient Name: Date of Service: [name], [name] 06/27/2021 1:30 PM Medical Record Number: [id number] Patient Account Number: [id number] Date of Birth/Sex: Treating RN: [birth year] (88 y.o. [name], [name] Primary Care [name]: [name] Other Clinician: Referring [name]: Treating [name]/Extender: [name]: 71 Visit Information History Since Last Visit Added or deleted any medications: No Patient Arrived: Walker Any new allergies or adverse reactions: No Arrival Time: 13:35 Had a fall or experienced change in No Accompanied Bell: family activities of daily living that may affect Transfer Assistance: None risk of falls: Patient Identification Verified: Yes Signs or symptoms of abuse/neglect since last visito No Secondary Verification Process Completed: Yes Hospitalized since last visit: No Patient Requires Transmission-Based No Implantable device outside of the clinic excluding No Precautions: cellular Bell based products placed in the center Patient Has Alerts: Yes since last visit: Patient Alerts: R ABI = Non compressible Has Dressing in Place as Prescribed: Yes L ABI = Non compressible Pain Present Now: No Electronic Signature(s) Signed: 07/02/2021 8:46:10 AM Bell: [name]: [name] on 06/27/2021 13:36:06 -------------------------------------------------------------------------------- Compression Therapy Details Patient Name: Date of Service: [name], [name] 06/27/2021 1:30 PM Medical Record Number: [id number] Patient Account Number: [id number] Date of Birth/Sex: Treating RN: August 06, 1925 (88 y.o. [name] Primary Care [name]: [name] Other Clinician: Referring [name]: Treating [name]/Extender: [name]: 103 Compression Therapy  Performed for Wound Assessment: Wound #20 Right,Circumferential Lower Leg Performed Bell: Clinician [name] Pilling, RN Compression Type: Three Layer Post Procedure Diagnosis Same as Pre-procedure Electronic Signature(s) Signed: 06/27/2021 4:29:12 PM Bell: [name] Pilling RN, BSN Entered Bell: [name] on 06/27/2021 14:21:04 -------------------------------------------------------------------------------- Compression Therapy Details Patient Name: Date of Service: [name], [name] 06/27/2021 1:30 PM Medical Record Number: [id number] Patient Account Number: [id number] Date of Birth/Sex: Treating RN: [birth year] (88 y.o. [name] Primary Care [name]: [name] Other Clinician: Referring [name]: Treating [name]/Extender: [name]: 16 Compression Therapy Performed for Wound Assessment: Wound #7RR Left,Circumferential Lower Leg Performed Bell: Clinician [name] Pilling, RN Compression Type: Three Layer Post Procedure Diagnosis Same as Pre-procedure Electronic Signature(s) Signed: 06/27/2021 4:29:12 PM Bell: [name] Pilling RN, BSN Entered Bell: [name] on 06/27/2021 14:21:04 -------------------------------------------------------------------------------- Encounter Discharge Information Details Patient Name: Date of Service: [name], [name]. 06/27/2021 1:30 PM Medical Record Number: [id number] Patient Account Number: [id number] Date of Birth/Sex: Treating RN: [birth year] (88 y.o. [name] Primary Care Jini Horiuchi: [name] Other Clinician: Referring [name]: Treating [name]/Extender: [name]: 88 Encounter Discharge Information Items Discharge Condition: Stable Ambulatory Status: Walker Discharge Destination: Home Transportation: Private Auto Accompanied Bell: daughter Schedule Follow-up Appointment: Yes Clinical Summary of Care: Electronic  Signature(s) Signed: 06/27/2021 4:29:12 PM Bell: [name] Pilling RN, BSN Entered Bell: [name] on 06/27/2021 14:27:10 -------------------------------------------------------------------------------- Lower Extremity Assessment Details Patient Name: Date of Service: [name], [name] 06/27/2021 1:30 PM Medical Record Number: [id number] Patient Account Number: [id number] Date of Birth/Sex: Treating RN: 1925-03-16 (88 y.o. [name] Primary Care [name]: [name] Other Clinician: Referring [name]: Treating [name]/Extender: [name]: 54 Edema Assessment Assessed: [Left: No] [Right: No] Edema: [Left: Yes] [Right: Yes] Calf Left: Right: Point of Measurement: 30 cm From Medial Instep 38 cm 36 cm Ankle Left: Right: Point of Measurement: 9  cm From Medial Instep 22 cm 22 cm Vascular Assessment Pulses: Dorsalis Pedis Palpable: [Left:Yes] [Right:Yes] Electronic Signature(s) Signed: 06/27/2021 4:29:12 PM Bell: [name] Pilling RN, BSN Signed: 07/02/2021 8:46:10 AM Bell: [name]: [name] on 06/27/2021 14:02:39 -------------------------------------------------------------------------------- Multi Wound Chart Details Patient Name: Date of Service: [name], [name] 06/27/2021 1:30 PM Medical Record Number: [id number] Patient Account Number: [id number] Date of Birth/Sex: Treating RN: Sep 08, 1925 (88 y.o. [name], [name] Primary Care [name]: [name] Other Clinician: Referring [name]: Treating [name]/Extender: [name]: 22 Vital Signs Height(in): 65 Pulse(bpm): 73 Weight(lbs): 163 Blood Pressure(mmHg): 113/69 Body Mass Index(BMI): 27.1 Temperature(F): 97.7 Respiratory Rate(breaths/min): 18 Photos: [N/A:N/A] Right, Circumferential Lower Leg Left, Circumferential Lower Leg N/A Wound Location: Gradually Appeared Blister N/A Wounding  Event: Lymphedema Malignant Wound N/A Primary Etiology: Anemia, Arrhythmia, Congestive Heart Anemia, Arrhythmia, Congestive Heart N/A Comorbid History: Failure, Hypertension, Colitis, Failure, Hypertension, Colitis, Osteoarthritis Osteoarthritis 06/25/2021 04/20/2020 N/A Date Acquired: 0 54 N/A Weeks of Bell: Open Open N/A Wound Status: No Yes N/A Wound Recurrence: Yes Yes N/A Clustered Wound: 23x22x0.1 20x22x0.1 N/A Measurements L x W x D (cm) 397.411 345.575 N/A A (cm) : rea 39.741 34.558 N/A Volume (cm) : 0.00% -11152.80% N/A % Reduction in Area: 0.00% -11156.70% N/A % Reduction in Volume: Full Thickness Without Exposed Full Thickness Without Exposed N/A Classification: Support Structures Support Structures Large Medium N/A Exudate Amount: Serosanguineous Serosanguineous N/A Exudate Type: red, brown red, brown N/A Exudate Color: Distinct, outline attached Distinct, outline attached N/A Wound Margin: Large (67-100%) Large (67-100%) N/A Granulation Amount: Pink Red, Pink N/A Granulation Quality: N/A None Present (0%) N/A Necrotic Amount: Fat Layer (Subcutaneous Bell): Yes Fat Layer (Subcutaneous Bell): Yes N/A Exposed Structures: Fascia: No Fascia: No Tendon: No Tendon: No Muscle: No Muscle: No Joint: No Joint: No Bone: No Bone: No None Large (67-100%) N/A Epithelialization: Compression Therapy Compression Therapy N/A Procedures Performed: Bell Notes Wound #20 (Lower Leg) Wound Laterality: Right, Circumferential Cleanser Soap and Water Discharge Instruction: May shower and wash wound with dial antibacterial soap and water prior to dressing change. Wound Cleanser Discharge Instruction: Cleanse the wound with wound cleanser prior to applying a clean dressing using gauze sponges, not Bell or cotton balls. Peri-Wound Care Sween Lotion (Moisturizing lotion) Discharge Instruction: Apply moisturizing lotion as directed Topical Primary  Dressing KerraCel Ag Gelling Fiber Dressing, 4x5 in (silver alginate) Discharge Instruction: Apply silver alginate to wound bed as instructed Secondary Dressing ABD Pad, 8x10 Discharge Instruction: Apply over primary dressing as directed. Woven Gauze Sponge, Non-Sterile 4x4 in Discharge Instruction: Apply over primary dressing as directed. Secured With Compression Wrap ThreePress (3 layer compression wrap) Discharge Instruction: Apply three layer compression as directed. Compression Stockings Add-Ons Wound #7RR (Lower Leg) Wound Laterality: Left, Circumferential Cleanser Soap and Water Discharge Instruction: May shower and wash wound with dial antibacterial soap and water prior to dressing change. Wound Cleanser Discharge Instruction: Cleanse the wound with wound cleanser prior to applying a clean dressing using gauze sponges, not Bell or cotton balls. Peri-Wound Care Sween Lotion (Moisturizing lotion) Discharge Instruction: Apply moisturizing lotion as directed Topical Primary Dressing KerraCel Ag Gelling Fiber Dressing, 4x5 in (silver alginate) Discharge Instruction: Apply silver alginate to wound bed as instructed Secondary Dressing ABD Pad, 8x10 Discharge Instruction: Apply over primary dressing as directed. Woven Gauze Sponge, Non-Sterile 4x4 in Discharge Instruction: Apply over primary dressing as directed. Secured With Compression Wrap ThreePress (3 layer compression wrap) Discharge Instruction: Apply three layer compression as  directed. Compression Stockings Add-Ons Electronic Signature(s) Signed: 06/27/2021 4:23:09 PM Bell: Linton Ham MD Signed: 06/27/2021 4:29:12 PM Bell: [name] Pilling RN, BSN Entered Bell: Linton Ham on 06/27/2021 14:46:03 -------------------------------------------------------------------------------- Multi-Disciplinary Care Plan Details Patient Name: Date of Service: [name], [place]. 06/27/2021 1:30 PM Medical Record Number:  [id number] Patient Account Number: [id number] Date of Birth/Sex: Treating RN: Sep 17, 1925 (88 y.o. [name] Primary Care Azaria Bartell: [name] Other Clinician: Referring Kayleen Alig: Treating Aurthur Wingerter/Extender: [name]: 60 Multidisciplinary Care Plan reviewed with physician Active Inactive Venous Leg Ulcer Nursing Diagnoses: Knowledge deficit related to disease process and management Potential for venous Insuffiency (use before diagnosis confirmed) Goals: Patient will maintain optimal edema control Date Initiated: 11/01/2020 Target Resolution Date: 07/18/2021 Goal Status: Active Interventions: Assess peripheral edema status every visit. Compression as ordered Provide education on venous insufficiency Bell Activities: Therapeutic compression applied : 11/01/2020 Notes: 12/27/20: Edema control ongoing 01/24/21: Edema control continues, using 3 layer compression Wound/Skin Impairment Nursing Diagnoses: Impaired Bell integrity Knowledge deficit related to ulceration/compromised skin integrity Goals: Patient/caregiver will verbalize understanding of skin care regimen Date Initiated: 06/11/2020 Target Resolution Date: 07/18/2021 Goal Status: Active Interventions: Assess patient/caregiver ability to obtain necessary supplies Assess patient/caregiver ability to perform ulcer/skin care regimen upon admission and as needed Assess ulceration(s) every visit Provide education on ulcer and skin care Notes: 10/11/20: Wound care regimen ongoing. 12/27/20: Wound care continues Electronic Signature(s) Signed: 06/27/2021 4:29:12 PM Bell: [name] Pilling RN, BSN Entered Bell: [name] on 06/27/2021 14:21:17 -------------------------------------------------------------------------------- Pain Assessment Details Patient Name: Date of Service: [name], [place]. 06/27/2021 1:30 PM Medical Record Number: [id number] Patient Account  Number: [id number] Date of Birth/Sex: Treating RN: 12-10-1925 (88 y.o. [name] Primary Care Calleen Alvis: [name] Other Clinician: Referring Sharis Bell: Treating Elaysha Bevard/Extender: [name]: 68 Active Problems Location of Pain Severity and Description of Pain Patient Has Paino No Site Locations Pain Management and Medication Current Pain Management: Electronic Signature(s) Signed: 06/27/2021 4:29:12 PM Bell: [name] Pilling RN, BSN Signed: 07/02/2021 8:46:10 AM Bell: [name]: [name] on 06/27/2021 13:36:41 -------------------------------------------------------------------------------- Patient/Caregiver Education Details Patient Name: Date of Service: [name], [place] A. 6/8/2023andnbsp1:30 PM Medical Record Number: [id number] Patient Account Number: [id number] Date of Birth/Gender: Treating RN: 1925/01/25 (87 y.o. [name] Primary Care Physician: [name] Other Clinician: Referring Physician: Treating Physician/Extender: [name]: 60 Education Assessment Education Provided To: Patient Education Topics Provided Wound/Skin Impairment: Handouts: Skin Care Do's and Dont's Methods: Explain/Verbal Responses: Reinforcements needed Electronic Signature(s) Signed: 06/27/2021 4:29:12 PM Bell: [name] Pilling RN, BSN Entered Bell: [name] on 06/27/2021 14:21:40 -------------------------------------------------------------------------------- Wound Assessment Details Patient Name: Date of Service: [name], [name]. 06/27/2021 1:30 PM Medical Record Number: [id number] Patient Account Number: [id number] Date of Birth/Sex: Treating RN: 05-19-1925 (88 y.o. [name] Primary Care Tiron Suski: [name] Other Clinician: Referring Hargis Vandyne: Treating Dorsie Burich/Extender: [name]:  53 Wound Status Wound Number: 20 Primary Lymphedema Etiology: Wound Location: Right, Circumferential Lower Leg Wound Open Wounding Event: Gradually Appeared Status: Date Acquired: 06/25/2021 Comorbid Anemia, Arrhythmia, Congestive Heart Failure, Hypertension, Weeks Of Bell: 0 History: Colitis, Osteoarthritis Clustered Wound: Yes Photos Wound Measurements Length: (cm) 23 Width: (cm) 22 Depth: (cm) 0.1 Area: (cm) 397.411 Volume: (cm) 39.741 % Reduction in Area: 0% % Reduction in Volume: 0% Epithelialization: None Tunneling: No Undermining: No Wound Description Classification: Full Thickness Without  Exposed Support Structures Wound Margin: Distinct, outline attached Exudate Amount: Large Exudate Type: Serosanguineous Exudate Color: red, brown Foul Odor After Cleansing: No Slough/Fibrino No Wound Bed Granulation Amount: Large (67-100%) Exposed Structure Granulation Quality: Pink Fascia Exposed: No Fat Layer (Subcutaneous Bell) Exposed: Yes Tendon Exposed: No Muscle Exposed: No Joint Exposed: No Bone Exposed: No Bell Notes Wound #20 (Lower Leg) Wound Laterality: Right, Circumferential Cleanser Soap and Water Discharge Instruction: May shower and wash wound with dial antibacterial soap and water prior to dressing change. Wound Cleanser Discharge Instruction: Cleanse the wound with wound cleanser prior to applying a clean dressing using gauze sponges, not Bell or cotton balls. Peri-Wound Care Sween Lotion (Moisturizing lotion) Discharge Instruction: Apply moisturizing lotion as directed Topical Primary Dressing KerraCel Ag Gelling Fiber Dressing, 4x5 in (silver alginate) Discharge Instruction: Apply silver alginate to wound bed as instructed Secondary Dressing ABD Pad, 8x10 Discharge Instruction: Apply over primary dressing as directed. Woven Gauze Sponge, Non-Sterile 4x4 in Discharge Instruction: Apply over primary dressing as directed. Secured  With Compression Wrap ThreePress (3 layer compression wrap) Discharge Instruction: Apply three layer compression as directed. Compression Stockings Add-Ons Electronic Signature(s) Signed: 06/27/2021 4:29:12 PM Bell: [name] Pilling RN, BSN Entered Bell: [name] on 06/27/2021 14:10:30 -------------------------------------------------------------------------------- Wound Assessment Details Patient Name: Date of Service: [name], [place]. 06/27/2021 1:30 PM Medical Record Number: [id number] Patient Account Number: [id number] Date of Birth/Sex: Treating RN: 04-04-1925 (88 y.o. [name], [name] Primary Care Adali Pennings: [name] Other Clinician: Referring Rondia Higginbotham: Treating Donis Pinder/Extender: [name]: 53 Wound Status Wound Number: [id number] Primary Malignant Wound Etiology: Wound Location: Left, Circumferential Lower Leg Wound Open Wounding Event: Blister Status: Date Acquired: 04/20/2020 Comorbid Anemia, Arrhythmia, Congestive Heart Failure, Hypertension, Weeks Of Bell: 54 History: Colitis, Osteoarthritis Clustered Wound: Yes Photos Wound Measurements Length: (cm) 20 Width: (cm) 22 Depth: (cm) 0.1 Area: (cm) 345.575 Volume: (cm) 34.558 % Reduction in Area: -11152.8% % Reduction in Volume: -11156.7% Epithelialization: Large (67-100%) Tunneling: No Undermining: No Wound Description Classification: Full Thickness Without Exposed Support Structures Wound Margin: Distinct, outline attached Exudate Amount: Medium Exudate Type: Serosanguineous Exudate Color: red, brown Foul Odor After Cleansing: No Slough/Fibrino No Wound Bed Granulation Amount: Large (67-100%) Exposed Structure Granulation Quality: Red, Pink Fascia Exposed: No Necrotic Amount: None Present (0%) Fat Layer (Subcutaneous Bell) Exposed: Yes Tendon Exposed: No Muscle Exposed: No Joint Exposed: No Bone Exposed: No Bell Notes Wound #7RR (Lower  Leg) Wound Laterality: Left, Circumferential Cleanser Soap and Water Discharge Instruction: May shower and wash wound with dial antibacterial soap and water prior to dressing change. Wound Cleanser Discharge Instruction: Cleanse the wound with wound cleanser prior to applying a clean dressing using gauze sponges, not Bell or cotton balls. Peri-Wound Care Sween Lotion (Moisturizing lotion) Discharge Instruction: Apply moisturizing lotion as directed Topical Primary Dressing KerraCel Ag Gelling Fiber Dressing, 4x5 in (silver alginate) Discharge Instruction: Apply silver alginate to wound bed as instructed Secondary Dressing ABD Pad, 8x10 Discharge Instruction: Apply over primary dressing as directed. Woven Gauze Sponge, Non-Sterile 4x4 in Discharge Instruction: Apply over primary dressing as directed. Secured With Compression Wrap ThreePress (3 layer compression wrap) Discharge Instruction: Apply three layer compression as directed. Compression Stockings Add-Ons Electronic Signature(s) Signed: 06/27/2021 4:29:12 PM Bell: [name] Pilling RN, BSN Signed: 06/27/2021 4:29:12 PM Bell: [name] Pilling RN, BSN Entered Bell: [name] on 06/27/2021 14:04:47 -------------------------------------------------------------------------------- Vitals Details Patient Name: Date of Service: [name], [place]. 06/27/2021 1:30 PM Medical Record Number: [id number] Patient Account  Number: [id number] Date of Birth/Sex: Treating RN: Nov 10, 1925 (88 y.o. [name], [name] Primary Care Avaleen Brownley: [name] Other Clinician: Referring Cami Delawder: Treating Adylynn Hertenstein/Extender: [name]: 69 Vital Signs Time Taken: 13:36 Temperature (F): 97.7 Height (in): 65 Pulse (bpm): 84 Weight (lbs): 163 Respiratory Rate (breaths/min): 18 Body Mass Index (BMI): 27.1 Blood Pressure (mmHg): 113/69 Reference Range: 80 - 120 mg / dl Electronic Signature(s) Signed:  07/02/2021 8:46:10 AM Bell: [name]: [name] on 06/27/2021 13:36:32

## 2021-07-02 NOTE — Progress Notes (Signed)
[name] ([id number]) , Visit Report for 06/10/2021 Arrival Information Details Patient Name: Date of Service: [name]. 06/10/2021 2:15 PM Medical Record Number: [id number] Patient Account Number: [id number] Date of Birth/Sex: Treating RN: 1925-10-08 (88 y.o. [name], [name] Primary Care Zvi Duplantis: [name] Other Clinician: Referring [name]: Treating [name]/Extender: [name] in Treatment: 61 Visit Information History Since Last Visit Added or deleted any medications: No Patient Arrived: Walker Any new allergies or adverse reactions: No Arrival Time: 13:56 Had a fall or experienced change in No Accompanied By: self activities of daily living that may affect Transfer Assistance: None risk of falls: Patient Identification Verified: Yes Signs or symptoms of abuse/neglect since last visito No Secondary Verification Process Completed: Yes Hospitalized since last visit: No Patient Requires Transmission-Based No Implantable device outside of the clinic excluding No Precautions: cellular tissue based products placed in the center Patient Has Alerts: Yes since last visit: Patient Alerts: R ABI = Non compressible Has Dressing in Place as Prescribed: Yes L ABI = Non compressible Has Compression in Place as Prescribed: Yes Pain Present Now: No Electronic Signature(s) Signed: 07/02/2021 8:46:54 AM By: Erenest Blank Entered By: Erenest Blank on 06/10/2021 13:56:47 -------------------------------------------------------------------------------- Compression Therapy Details Patient Name: Date of Service: [name] ETT, Besan A. 06/10/2021 2:15 PM Medical Record Number: [id number] Patient Account Number: [id number] Date of Birth/Sex: Treating RN: 15-Feb-1925 (88 y.o. [name] Primary Care [name]: [name] Other Clinician: Referring [name]: Treating [name]/Extender: [name] in Treatment: 62 Compression Therapy Performed for Wound Assessment: Wound #7RR Left,Circumferential Lower Leg Performed By: Clinician Levan Hurst, RN Compression Type: Three Layer Post Procedure Diagnosis Same as Pre-procedure Electronic Signature(s) Signed: 06/10/2021 5:26:25 PM By: Levan Hurst RN, BSN Entered By: Levan Hurst on 06/10/2021 14:46:18 -------------------------------------------------------------------------------- Compression Therapy Details Patient Name: Date of Service: [name] ETT, [place]. 06/10/2021 2:15 PM Medical Record Number: [id number] Patient Account Number: [id number] Date of Birth/Sex: Treating RN: 21-Dec-1925 (88 y.o. [name] Primary Care [name]: [name] Other Clinician: Referring [name]: Treating [name]/Extender: [name] in Treatment: 17 Compression Therapy Performed for Wound Assessment: NonWound Condition Lymphedema - Right Leg Performed By: Clinician Levan Hurst, RN Compression Type: Three Layer Post Procedure Diagnosis Same as Pre-procedure Electronic Signature(s) Signed: 06/10/2021 5:26:25 PM By: Levan Hurst RN, BSN Entered By: Levan Hurst on 06/10/2021 14:46:53 -------------------------------------------------------------------------------- Encounter Discharge Information Details Patient Name: Date of Service: [name] ETT, [name] 06/10/2021 2:15 PM Medical Record Number: [id number] Patient Account Number: [id number] Date of Birth/Sex: Treating RN: 30-May-1925 (88 y.o. [name] Primary Care Stephanie Mcglone: [name] Other Clinician: Referring [name]: Treating [name]/Extender: [name] in Treatment: 36 Encounter Discharge Information Items Discharge Condition: Stable Ambulatory Status: Walker Discharge Destination: Home Transportation: Private Auto Accompanied By: alone Schedule Follow-up Appointment:  Yes Clinical Summary of Care: Patient Declined Electronic Signature(s) Signed: 06/10/2021 5:26:25 PM By: Levan Hurst RN, BSN Entered By: Levan Hurst on 06/10/2021 15:13:23 -------------------------------------------------------------------------------- Lower Extremity Assessment Details Patient Name: Date of Service: [name] ETT, [name] 06/10/2021 2:15 PM Medical Record Number: [id number] Patient Account Number: [id number] Date of Birth/Sex: Treating RN: Nov 15, 1925 (88 y.o. [name] Primary Care [name]: [name] Other Clinician: Referring Leldon Steege: Treating Zaydenn Balaguer/Extender: [name] in Treatment: 52 Edema Assessment Assessed: [Left: Yes] [Right: No] Edema: [Left: Yes] [Right: Yes] Calf Left: Right: Point of Measurement: 30 cm From Medial Instep 33.5  cm 43 cm Ankle Left: Right: Point of Measurement: 9 cm From Medial Instep 23.1 cm 22.4 cm Vascular Assessment Pulses: Dorsalis Pedis Palpable: [Left:Yes] [Right:Yes] Electronic Signature(s) Signed: 06/11/2021 4:34:51 PM By: Deon Pilling RN, BSN Signed: 07/02/2021 8:46:54 AM By: Erenest Blank Entered By: Erenest Blank on 06/10/2021 14:28:27 -------------------------------------------------------------------------------- Multi Wound Chart Details Patient Name: Date of Service: [name] ETT, [name]. 06/10/2021 2:15 PM Medical Record Number: [id number] Patient Account Number: [id number] Date of Birth/Sex: Treating RN: September 03, 1925 (88 y.o. [name] Primary Care [name]: [name] Other Clinician: Referring Malayna Noori: Treating Brenleigh Collet/Extender: [name] in Treatment: 50 Vital Signs Height(in): 65 Pulse(bpm): 75 Weight(lbs): 163 Blood Pressure(mmHg): 117/71 Body Mass Index(BMI): 27.1 Temperature(F): 97 Respiratory Rate(breaths/min): 18 Photos: [N/A:N/A] Left, Circumferential Lower Leg N/A N/A Wound  Location: Blister N/A N/A Wounding Event: Malignant Wound N/A N/A Primary Etiology: Anemia, Arrhythmia, Congestive Heart N/A N/A Comorbid History: Failure, Hypertension, Colitis, Osteoarthritis 04/20/2020 N/A N/A Date Acquired: 30 N/A N/A Weeks of Treatment: Open N/A N/A Wound Status: Yes N/A N/A Wound Recurrence: Yes N/A N/A Clustered Wound: 2 N/A N/A Clustered Quantity: 3x2.5x0.1 N/A N/A Measurements L x W x D (cm) 5.89 N/A N/A A (cm) : rea 0.589 N/A N/A Volume (cm) : -91.80% N/A N/A % Reduction in Area: -91.90% N/A N/A % Reduction in Volume: Full Thickness Without Exposed N/A N/A Classification: Support Structures Medium N/A N/A Exudate Amount: Serosanguineous N/A N/A Exudate Type: red, brown N/A N/A Exudate Color: Distinct, outline attached N/A N/A Wound Margin: Large (67-100%) N/A N/A Granulation Amount: Red, Pink N/A N/A Granulation Quality: None Present (0%) N/A N/A Necrotic Amount: Fat Layer (Subcutaneous Tissue): Yes N/A N/A Exposed Structures: Fascia: No Tendon: No Muscle: No Joint: No Bone: No Large (67-100%) N/A N/A Epithelialization: Compression Therapy N/A N/A Procedures Performed: Treatment Notes Electronic Signature(s) Signed: 06/10/2021 3:21:34 PM By: Kalman Shan DO Signed: 06/11/2021 4:34:51 PM By: Deon Pilling RN, BSN Entered By: Kalman Shan on 06/10/2021 14:55:04 -------------------------------------------------------------------------------- Multi-Disciplinary Care Plan Details Patient Name: Date of Service: [name] ETT, Kitrina A. 06/10/2021 2:15 PM Medical Record Number: [id number] Patient Account Number: [id number] Date of Birth/Sex: Treating RN: [birth year] (87 y.o. [name] Primary Care Xzaria Teo: [name] Other Clinician: Referring Emmalina Espericueta: Treating Jayln Madeira/Extender: [name] in Treatment: 34 Multidisciplinary Care Plan reviewed with physician Active  Inactive Venous Leg Ulcer Nursing Diagnoses: Knowledge deficit related to disease process and management Potential for venous Insuffiency (use before diagnosis confirmed) Goals: Patient will maintain optimal edema control Date Initiated: 11/01/2020 Target Resolution Date: 07/18/2021 Goal Status: Active Interventions: Assess peripheral edema status every visit. Compression as ordered Provide education on venous insufficiency Treatment Activities: Therapeutic compression applied : 11/01/2020 Notes: 12/27/20: Edema control ongoing 01/24/21: Edema control continues, using 3 layer compression Wound/Skin Impairment Nursing Diagnoses: Impaired tissue integrity Knowledge deficit related to ulceration/compromised skin integrity Goals: Patient/caregiver will verbalize understanding of skin care regimen Date Initiated: 06/11/2020 Target Resolution Date: 07/18/2021 Goal Status: Active Interventions: Assess patient/caregiver ability to obtain necessary supplies Assess patient/caregiver ability to perform ulcer/skin care regimen upon admission and as needed Assess ulceration(s) every visit Provide education on ulcer and skin care Notes: 10/11/20: Wound care regimen ongoing. 12/27/20: Wound care continues Electronic Signature(s) Signed: 06/10/2021 5:26:25 PM By: Levan Hurst RN, BSN Entered By: Levan Hurst on 06/10/2021 14:41:14 -------------------------------------------------------------------------------- Pain Assessment Details Patient Name: Date of Service: [name] ETT, [place]. 06/10/2021 2:15 PM Medical Record Number: [id number] Patient Account Number: [id number] Date of Birth/Sex: Treating RN:  06/22/1925 (88 y.o. [name] Primary Care Koua Deeg: [name] Other Clinician: Referring Judieth Mckown: Treating Ariauna Farabee/Extender: [name] in Treatment: 45 Active Problems Location of Pain Severity and Description of Pain Patient Has Paino  No Site Locations Pain Management and Medication Current Pain Management: Electronic Signature(s) Signed: 06/11/2021 4:34:51 PM By: Deon Pilling RN, BSN Signed: 07/02/2021 8:46:54 AM By: Erenest Blank Entered By: Erenest Blank on 06/10/2021 13:58:54 -------------------------------------------------------------------------------- Patient/Caregiver Education Details Patient Name: Date of Service: [name] ETT, Dejah A. 5/22/2023andnbsp2:15 PM Medical Record Number: [id number] Patient Account Number: [id number] Date of Birth/Gender: Treating RN: [birth year] (88 y.o. [name] Primary Care Physician: [name] Other Clinician: Referring Physician: Treating Physician/Extender: [name] in Treatment: 93 Education Assessment Education Provided To: Patient Education Topics Provided Wound/Skin Impairment: Methods: Explain/Verbal Responses: State content correctly [employer]) Signed: 06/10/2021 5:26:25 PM By: Levan Hurst RN, BSN Entered By: Levan Hurst on 06/10/2021 14:44:32 -------------------------------------------------------------------------------- Wound Assessment Details Patient Name: Date of Service: [name] ETT, Nesa A. 06/10/2021 2:15 PM Medical Record Number: [id number] Patient Account Number: [id number] Date of Birth/Sex: Treating RN: September 29, 1925 (88 y.o. [name], [name] Primary Care Anjelina Dung: [name] Other Clinician: Referring Sharla Tankard: Treating Lincoln Ginley/Extender: [name] in Treatment: 42 Wound Status Wound Number: [id number] Primary Malignant Wound Etiology: Wound Location: Left, Circumferential Lower Leg Wound Open Wounding Event: Blister Status: Date Acquired: 04/20/2020 Comorbid Anemia, Arrhythmia, Congestive Heart Failure, Hypertension, Weeks Of Treatment: 52 History: Colitis, Osteoarthritis Clustered Wound: Yes Photos Wound Measurements Length: (cm)  3 Width: (cm) 2.5 Depth: (cm) 0.1 Clustered Quantity: 2 Area: (cm) 5.89 Volume: (cm) 0.589 % Reduction in Area: -91.8% % Reduction in Volume: -91.9% Epithelialization: Large (67-100%) Tunneling: No Undermining: No Wound Description Classification: Full Thickness Without Exposed Support Structures Wound Margin: Distinct, outline attached Exudate Amount: Medium Exudate Type: Serosanguineous Exudate Color: red, brown Foul Odor After Cleansing: No Slough/Fibrino No Wound Bed Granulation Amount: Large (67-100%) Exposed Structure Granulation Quality: Red, Pink Fascia Exposed: No Necrotic Amount: None Present (0%) Fat Layer (Subcutaneous Tissue) Exposed: Yes Tendon Exposed: No Muscle Exposed: No Joint Exposed: No Bone Exposed: No Electronic Signature(s) Signed: 06/11/2021 4:34:51 PM By: Deon Pilling RN, BSN Signed: 07/02/2021 8:46:54 AM By: Erenest Blank Entered By: Erenest Blank on 06/10/2021 14:27:42 -------------------------------------------------------------------------------- Vitals Details Patient Name: Date of Service: [name] ETT, Mosetta A. 06/10/2021 2:15 PM Medical Record Number: [id number] Patient Account Number: [id number] Date of Birth/Sex: Treating RN: Oct 24, 1925 (88 y.o. [name], [name] Primary Care Jaleisa Brose: [name] Other Clinician: Referring Analyn Matusek: Treating Nikki Rusnak/Extender: [name] in Treatment: 66 Vital Signs Time Taken: 13:58 Temperature (F): 97 Height (in): 65 Pulse (bpm): 75 Weight (lbs): 163 Respiratory Rate (breaths/min): 18 Body Mass Index (BMI): 27.1 Blood Pressure (mmHg): 117/71 Reference Range: 80 - 120 mg / dl Electronic Signature(s) Signed: 07/02/2021 8:46:54 AM By: Erenest Blank Entered By: Erenest Blank on 06/10/2021 13:58:45

## 2021-07-04 NOTE — Progress Notes (Signed)
NOBUKO GSELL ([id number]) , Visit Report for 07/02/2021 Arrival Information Details Patient Name: Date of Service: [name], [place]. 07/02/2021 1:30 PM Medical Record Number: [id number] Patient Account Number: [id number] Date of Birth/Sex: Treating RN: 1925/10/05 (88 y.o. [name], [name] Primary Care Kalisha Keadle: [name] Other Clinician: Referring Ritu Gagliardo: Treating Maxie Slovacek/Extender: [name] in Treatment: 50 Visit Information History Since Last Visit Added or deleted any medications: No Patient Arrived: Walker Any new allergies or adverse reactions: No Arrival Time: 13:35 Had a fall or experienced change in No Accompanied By: self activities of daily living that may affect Transfer Assistance: None risk of falls: Patient Identification Verified: Yes Signs or symptoms of abuse/neglect since last visito No Secondary Verification Process Completed: Yes Hospitalized since last visit: No Patient Requires Transmission-Based No Implantable device outside of the clinic excluding No Precautions: cellular tissue based products placed in the center Patient Has Alerts: Yes since last visit: Patient Alerts: R ABI = Non compressible Has Dressing in Place as Prescribed: Yes L ABI = Non compressible Has Compression in Place as Prescribed: Yes Pain Present Now: No Electronic Signature(s) Signed: 07/02/2021 5:07:46 PM By: Deon Pilling RN, BSN Entered By: Deon Pilling on 07/02/2021 13:37:04 -------------------------------------------------------------------------------- Compression Therapy Details Patient Name: Date of Service: [name], [place] A. 07/02/2021 1:30 PM Medical Record Number: [id number] Patient Account Number: [id number] Date of Birth/Sex: Treating RN: September 11, 1925 (88 y.o. [name] Primary Care Caden Fatica: [name] Other Clinician: Referring Clotiel Troop: Treating Latasha Buczkowski/Extender: [name] in Treatment: 79 Compression Therapy Performed for Wound Assessment: Wound #20 Right,Circumferential Lower Leg Performed By: Clinician Deon Pilling, RN Compression Type: Three Layer Post Procedure Diagnosis Same as Pre-procedure Electronic Signature(s) Signed: 07/02/2021 5:07:46 PM By: Deon Pilling RN, BSN Entered By: Deon Pilling on 07/02/2021 14:00:26 -------------------------------------------------------------------------------- Compression Therapy Details Patient Name: Date of Service: [name], [place]. 07/02/2021 1:30 PM Medical Record Number: [id number] Patient Account Number: [id number] Date of Birth/Sex: Treating RN: 1925-10-05 (88 y.o. [name] Primary Care Alaiah Lundy: [name] Other Clinician: Referring Leshon Armistead: Treating Brandace Cargle/Extender: [name] in Treatment: 2 Compression Therapy Performed for Wound Assessment: Wound #7RR Left,Circumferential Lower Leg Performed By: Clinician Deon Pilling, RN Compression Type: Three Layer Post Procedure Diagnosis Same as Pre-procedure Electronic Signature(s) Signed: 07/02/2021 5:07:46 PM By: Deon Pilling RN, BSN Entered By: Deon Pilling on 07/02/2021 14:00:26 -------------------------------------------------------------------------------- Lower Extremity Assessment Details Patient Name: Date of Service: [name], [place]. 07/02/2021 1:30 PM Medical Record Number: [id number] Patient Account Number: [id number] Date of Birth/Sex: Treating RN: 1925/02/22 (88 y.o. [name] Primary Care Venna Berberich: [name] Other Clinician: Referring Monetta Lick: Treating Patrese Neal/Extender: [name] in Treatment: 55 Edema Assessment Assessed: [Left: Yes] [Right: Yes] Edema: [Left: Yes] [Right: Yes] Calf Left: Right: Point of Measurement: 30 cm From Medial Instep 34 cm 32.5 cm Ankle Left: Right: Point of Measurement: 9 cm From  Medial Instep 22.5 cm 22 cm Vascular Assessment Pulses: Dorsalis Pedis Palpable: [Left:Yes] [Right:Yes] Posterior Tibial Palpable: [Left:Yes] [Right:Yes] Electronic Signature(s) Signed: 07/02/2021 5:07:46 PM By: Deon Pilling RN, BSN Entered By: Deon Pilling on 07/02/2021 13:45:05 -------------------------------------------------------------------------------- Multi Wound Chart Details Patient Name: Date of Service: [name], [place]. 07/02/2021 1:30 PM Medical Record Number: [id number] Patient Account Number: [id number] Date of Birth/Sex: Treating RN: 07-26-1925 (88 y.o. [name] Primary Care Coraline Talwar: [name] Other Clinician: Referring Talyia Allende: Treating Britiany Silbernagel/Extender: [name]  in Treatment: 55 Vital Signs Height(in): 65 Pulse(bpm): 87 Weight(lbs): 163 Blood Pressure(mmHg): 114/71 Body Mass Index(BMI): 27.1 Temperature(F): 98 Respiratory Rate(breaths/min): 20 Photos: [N/A:N/A] Right, Circumferential Lower Leg Left, Circumferential Lower Leg N/A Wound Location: Gradually Appeared Blister N/A Wounding Event: Lymphedema Malignant Wound N/A Primary Etiology: Anemia, Arrhythmia, Congestive Heart Anemia, Arrhythmia, Congestive Heart N/A Comorbid History: Failure, Hypertension, Colitis, Failure, Hypertension, Colitis, Osteoarthritis Osteoarthritis 06/25/2021 04/20/2020 N/A Date Acquired: 0 55 N/A Weeks of Treatment: Open Open N/A Wound Status: No Yes N/A Wound Recurrence: Yes Yes N/A Clustered Wound: 18x32.5x0.1 10x22.5x0.1 N/A Measurements L x W x D (cm) 459.458 176.715 N/A A (cm) : rea 45.946 17.671 N/A Volume (cm) : -15.60% -5654.30% N/A % Reduction in Area: -15.60% -5656.00% N/A % Reduction in Volume: Full Thickness Without Exposed Full Thickness Without Exposed N/A Classification: Support Structures Support Structures Large Medium N/A Exudate Amount: Serosanguineous Serosanguineous  N/A Exudate Type: red, brown red, brown N/A Exudate Color: Distinct, outline attached Distinct, outline attached N/A Wound Margin: Large (67-100%) Large (67-100%) N/A Granulation Amount: Pink Red, Pink N/A Granulation Quality: N/A None Present (0%) N/A Necrotic Amount: Fat Layer (Subcutaneous Tissue): Yes Fat Layer (Subcutaneous Tissue): Yes N/A Exposed Structures: Fascia: No Fascia: No Tendon: No Tendon: No Muscle: No Muscle: No Joint: No Joint: No Bone: No Bone: No None Large (67-100%) N/A Epithelialization: Compression Therapy Compression Therapy N/A Procedures Performed: Treatment Notes Electronic Signature(s) Signed: 07/02/2021 5:07:46 PM By: Deon Pilling RN, BSN Signed: 07/02/2021 5:27:49 PM By: Kalman Shan DO Entered By: Kalman Shan on 07/02/2021 15:33:33 -------------------------------------------------------------------------------- Multi-Disciplinary Care Plan Details Patient Name: Date of Service: [name], Aubreyanna A. 07/02/2021 1:30 PM Medical Record Number: [id number] Patient Account Number: [id number] Date of Birth/Sex: Treating RN: 1925/09/29 (88 y.o. [name] Primary Care Heaven Meeker: [name] Other Clinician: Referring Jeryn Cerney: Treating Teasha Murrillo/Extender: [name] in Treatment: 48 Multidisciplinary Care Plan reviewed with physician Active Inactive Venous Leg Ulcer Nursing Diagnoses: Knowledge deficit related to disease process and management Potential for venous Insuffiency (use before diagnosis confirmed) Goals: Patient will maintain optimal edema control Date Initiated: 11/01/2020 Target Resolution Date: 08/16/2021 Goal Status: Active Interventions: Assess peripheral edema status every visit. Compression as ordered Provide education on venous insufficiency Treatment Activities: Therapeutic compression applied : 11/01/2020 Notes: 12/27/20: Edema control ongoing 01/24/21: Edema  control continues, using 3 layer compression Wound/Skin Impairment Nursing Diagnoses: Impaired tissue integrity Knowledge deficit related to ulceration/compromised skin integrity Goals: Patient/caregiver will verbalize understanding of skin care regimen Date Initiated: 06/11/2020 Target Resolution Date: 08/16/2021 Goal Status: Active Interventions: Assess patient/caregiver ability to obtain necessary supplies Assess patient/caregiver ability to perform ulcer/skin care regimen upon admission and as needed Assess ulceration(s) every visit Provide education on ulcer and skin care Notes: 10/11/20: Wound care regimen ongoing. 12/27/20: Wound care continues Electronic Signature(s) Signed: 07/02/2021 5:07:46 PM By: Deon Pilling RN, BSN Entered By: Deon Pilling on 07/02/2021 13:46:20 -------------------------------------------------------------------------------- Pain Assessment Details Patient Name: Date of Service: [name], [place]. 07/02/2021 1:30 PM Medical Record Number: [id number] Patient Account Number: [id number] Date of Birth/Sex: Treating RN: Mar 08, 1925 (88 y.o. [name] Primary Care Nery Frappier: [name] Other Clinician: Referring Leo Weyandt: Treating Nieshia Larmon/Extender: [name] in Treatment: 91 Active Problems Location of Pain Severity and Description of Pain Patient Has Paino No Site Locations Rate the pain. Current Pain Level: 0 Pain Management and Medication Current Pain Management: Medication: No Cold Application: No Rest: No Massage: No Activity: No T.E.N.S.: No Heat Application: No Leg drop  or elevation: No Is the Current Pain Management Adequate: Adequate How does your wound impact your activities of daily livingo Sleep: No Bathing: No Appetite: No Relationship With Others: No Bladder Continence: No Emotions: No Bowel Continence: No Work: No Toileting: No Drive: No Dressing: No Hobbies:  No Engineer, maintenance) Signed: 07/02/2021 5:07:46 PM By: Deon Pilling RN, BSN Entered By: Deon Pilling on 07/02/2021 13:37:23 -------------------------------------------------------------------------------- Patient/Caregiver Education Details Patient Name: Date of Service: [name], Cassadie A. 6/13/2023andnbsp1:30 PM Medical Record Number: [id number] Patient Account Number: [id number] Date of Birth/Gender: Treating RN: 05/12/1925 (88 y.o. [name] Primary Care Physician: [name] Other Clinician: Referring Physician: Treating Physician/Extender: [name] in Treatment: 14 Education Assessment Education Provided To: Patient Education Topics Provided Wound/Skin Impairment: Handouts: Skin Care Do's and Dont's Methods: Explain/Verbal Responses: State content correctly Electronic Signature(s) Signed: 07/02/2021 5:07:46 PM By: Deon Pilling RN, BSN Entered By: Deon Pilling on 07/02/2021 13:46:37 -------------------------------------------------------------------------------- Wound Assessment Details Patient Name: Date of Service: [name], [place]. 07/02/2021 1:30 PM Medical Record Number: [id number] Patient Account Number: [id number] Date of Birth/Sex: Treating RN: [birth year] (88 y.o. [name] Primary Care Tametra Ahart: [name] Other Clinician: Referring Gared Gillie: Treating Jamita Mckelvin/Extender: [name] in Treatment: 77 Wound Status Wound Number: 20 Primary Lymphedema Etiology: Wound Location: Right, Circumferential Lower Leg Wound Open Wounding Event: Gradually Appeared Status: Date Acquired: 06/25/2021 Comorbid Anemia, Arrhythmia, Congestive Heart Failure, Hypertension, Weeks Of Treatment: 0 History: Colitis, Osteoarthritis Clustered Wound: Yes Photos Wound Measurements Length: (cm) 18 Width: (cm) 32.5 Depth: (cm) 0.1 Area: (cm) 459.458 Volume: (cm) 45.946 %  Reduction in Area: -15.6% % Reduction in Volume: -15.6% Epithelialization: None Tunneling: No Undermining: No Wound Description Classification: Full Thickness Without Exposed Support Structures Wound Margin: Distinct, outline attached Exudate Amount: Large Exudate Type: Serosanguineous Exudate Color: red, brown Foul Odor After Cleansing: No Slough/Fibrino No Wound Bed Granulation Amount: Large (67-100%) Exposed Structure Granulation Quality: Pink Fascia Exposed: No Fat Layer (Subcutaneous Tissue) Exposed: Yes Tendon Exposed: No Muscle Exposed: No Joint Exposed: No Bone Exposed: No Electronic Signature(s) Signed: 07/02/2021 5:07:46 PM By: Deon Pilling RN, BSN Signed: 07/04/2021 5:29:07 PM By: Rhae Hammock RN Entered By: Rhae Hammock on 07/02/2021 13:46:18 -------------------------------------------------------------------------------- Wound Assessment Details Patient Name: Date of Service: [name], [place] A. 07/02/2021 1:30 PM Medical Record Number: [id number] Patient Account Number: [id number] Date of Birth/Sex: Treating RN: 02-Nov-1925 (88 y.o. [name], [name] Primary Care Aryn Kops: [name] Other Clinician: Referring Verneta Hamidi: Treating Josslin Sanjuan/Extender: [name] in Treatment: 53 Wound Status Wound Number: [id number] Primary Malignant Wound Etiology: Wound Location: Left, Circumferential Lower Leg Wound Open Wounding Event: Blister Status: Date Acquired: 04/20/2020 Comorbid Anemia, Arrhythmia, Congestive Heart Failure, Hypertension, Weeks Of Treatment: 55 History: Colitis, Osteoarthritis Clustered Wound: Yes Photos Wound Measurements Length: (cm) 10 Width: (cm) 22.5 Depth: (cm) 0.1 Area: (cm) 176.715 Volume: (cm) 17.671 % Reduction in Area: -5654.3% % Reduction in Volume: -5656% Epithelialization: Large (67-100%) Tunneling: No Undermining: No Wound Description Classification: Full Thickness Without  Exposed Support Structures Wound Margin: Distinct, outline attached Exudate Amount: Medium Exudate Type: Serosanguineous Exudate Color: red, brown Foul Odor After Cleansing: No Slough/Fibrino No Wound Bed Granulation Amount: Large (67-100%) Exposed Structure Granulation Quality: Red, Pink Fascia Exposed: No Necrotic Amount: None Present (0%) Fat Layer (Subcutaneous Tissue) Exposed: Yes Tendon Exposed: No Muscle Exposed: No Joint Exposed: No Bone Exposed: No Electronic Signature(s) Signed: 07/02/2021 5:07:46 PM By: Deon Pilling RN, BSN Signed: 07/04/2021 5:29:07  PM By: Rhae Hammock RN Entered By: Rhae Hammock on 07/02/2021 13:46:38 -------------------------------------------------------------------------------- Vitals Details Patient Name: Date of Service: [name], [place]. 07/02/2021 1:30 PM Medical Record Number: [id number] Patient Account Number: [id number] Date of Birth/Sex: Treating RN: [birth year] (88 y.o. [name], [name] Primary Care Krystl Wickware: [name] Other Clinician: Referring Greysen Swanton: Treating Tawsha Terrero/Extender: [name] in Treatment: 29 Vital Signs Time Taken: 13:35 Temperature (F): 98 Height (in): 65 Pulse (bpm): 87 Weight (lbs): 163 Respiratory Rate (breaths/min): 20 Body Mass Index (BMI): 27.1 Blood Pressure (mmHg): 114/71 Reference Range: 80 - 120 mg / dl Electronic Signature(s) Signed: 07/02/2021 5:07:46 PM By: Deon Pilling RN, BSN Entered By: Deon Pilling on 07/02/2021 13:37:15

## 2021-07-08 ENCOUNTER — Other Ambulatory Visit: Payer: Self-pay | Admitting: Cardiovascular Disease

## 2021-07-08 ENCOUNTER — Encounter (HOSPITAL_BASED_OUTPATIENT_CLINIC_OR_DEPARTMENT_OTHER): Payer: Medicare Other | Admitting: Internal Medicine

## 2021-07-08 DIAGNOSIS — L97822 Non-pressure chronic ulcer of other part of left lower leg with fat layer exposed: Secondary | ICD-10-CM | POA: Diagnosis not present

## 2021-07-08 DIAGNOSIS — C4492 Squamous cell carcinoma of skin, unspecified: Secondary | ICD-10-CM

## 2021-07-08 DIAGNOSIS — I87313 Chronic venous hypertension (idiopathic) with ulcer of bilateral lower extremity: Secondary | ICD-10-CM | POA: Diagnosis not present

## 2021-07-08 DIAGNOSIS — L97812 Non-pressure chronic ulcer of other part of right lower leg with fat layer exposed: Secondary | ICD-10-CM | POA: Diagnosis not present

## 2021-07-08 DIAGNOSIS — M1990 Unspecified osteoarthritis, unspecified site: Secondary | ICD-10-CM | POA: Diagnosis not present

## 2021-07-08 DIAGNOSIS — I890 Lymphedema, not elsewhere classified: Secondary | ICD-10-CM | POA: Diagnosis not present

## 2021-07-08 NOTE — Telephone Encounter (Signed)
Prescription refill request for Eliquis received. Indication:Afib Last office visit:needs appointment Scr:1.2 Age: 88 Weight:69.3 kg  Prescription refilled

## 2021-07-10 ENCOUNTER — Telehealth: Payer: Self-pay | Admitting: *Deleted

## 2021-07-10 NOTE — Progress Notes (Signed)
[name] ([id number]) , Visit Report for 07/08/2021 Chief Complaint Document Details Patient Name: Date of Service: [name], [name]. 07/08/2021 1:45 PM Medical Record Number: [id number] Patient Account Number: [id number] Date of Birth/Sex: Treating RN: [birth year] (88 y.o. [name], [name] Primary Care Provider: [name] Other Clinician: Referring Provider: Treating Provider/Extender: [name] in Treatment: 38 Information Obtained from: Patient Chief Complaint Bilateral lower extremity wounds that have been biopsied and positive for squamous cell carcinoma Bilateral lower extremity wounds due to chronic venous insufficiency/lymphedema Electronic Signature(s) Signed: 07/08/2021 4:01:15 PM By: Kalman Shan DO Entered By: Kalman Shan on 07/08/2021 15:12:40 -------------------------------------------------------------------------------- HPI Details Patient Name: Date of Service: [name], [name] 07/08/2021 1:45 PM Medical Record Number: [id number] Patient Account Number: [id number] Date of Birth/Sex: Treating RN: 01/18/1926 (88 y.o. [name] Primary Care Provider: [name] Other Clinician: Referring Provider: Treating Provider/Extender: [name] in Treatment: 30 History of Present Illness Location: left leg HPI Description: Admission 5/23 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 She states that the wound on the right has been present for a year. The 1 on the left has opened up 1 month ago. She is followed with oncology for this issue as she had biopsies that showed squamous cell carcinoma. She is also seeing radiation oncology for  treatment options. She presents today because she would like for her wounds to be healed by [place]. She currently denies signs of infection. 6/1; patient presents for 1 week follow-up. She states she has tolerated the leg wraps well. She states these do not bother her and is happy to continue with them. She is scheduled to see her oncologist today to go over treatment options for the bilateral lower extremity squamous cell carcinoma. Radiation is currently not a recommended option. Patient states she overall feels well. 6/22; patient presents for 3-week follow-up. She has tolerated the wraps well until her last wrap where she states they were uncomfortable. She attributes this to the home health nurse. She denies signs of infection. She has started her first treatment of antibody infusions for her Bilateral lower extremity squamous cell carcinoma. She has no complaints today. 7/21; patient presents for 1 month follow-up. Unfortunately she has not had good experience with her wrap changes with home health. She would like to do her own dressing changes. She continues to do her antibody infusions. She denies signs of infection. 7/28; patient presents for 1 week follow-up. At last clinic visit she was switched to daily dressing changes due to issues with the wrap and home health placing them. Unfortunately she has developed weeping to her legs bilaterally. She would like to be placed in wraps today. She would also like to follow with [place] weekly for wrap changes instead of having home health change them. She denies signs of infection. 8/4; patient presents for 1 week follow-up. She has tolerated the Kerlix/Coban wraps well. She no longer has weeping to her legs. She took the wrap off 1 day before coming in to be able to take a shower. She has no issues or complaints today. She denies signs of infection. 8/18; patient presents for follow-up. Patient has tolerated the wraps well. She brought her Velcro  compression wraps today. She has no  issues or complaints today. She had her chemotherapy infusion yesterday without issues. She denies signs of infection. 8/25; patient presents for follow-up. She used her juxta lite compressions for the past week. It is unclear if she is able to put these on correctly since she states she has a hard time getting them to look right. She reports 2 open wounds. She currently denies signs of infection. 9/1; patient presents for 1 week follow-up. She has 1 open wound. She tolerated the compression wraps well. She currently denies signs of infection. 9/8; patient presents for 1 week follow-up. She has 2 open wounds 1 on each leg. She has tolerated the compression wrap well. She currently denies signs of infection. 9/15; patient presents for 1 week follow-up. She now has 3 wounds. 2 on the left and 1 on the right. She continues to tolerate the compression wrap well. She currently denies signs of infection. She has obtained furosemide by her primary care physician and would like to discuss when to take this. 9/22; patient presents for 1 week follow-up. She has scattered wounds on her lower extremities bilaterally. She did take furosemide twice in the past week. She does not recall having to urinate more frequently. She tolerated the 3 layer compression well. She denies signs of infection. 10/13; patient has not been here recently because of [place]. Apparently the facility was only putting gauze on her legs. This is a patient I do not normally see. 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 Ronnald Ramp at [facility] dermatology. She is quite convincing that she did not have radiation to her lower legs. It is likely she also has significant chronic venous insufficiency stasis dermatitis. We have been using silver alginate under kerlix Coban. She has obvious open areas medially on the right and areas on the  left. She also has areas of extensive dry flaking adherent areas on the right and to a lesser extent on the left anterior. Nodular areas on the left lateral lower leg left posterior calf and right mid calf medially. 10/21; patient presents for follow-up. She has no issues or complaints today. She has tolerated the 3 layer compression well bilaterally. She denies signs of infection. 10/27; patient presents for follow-up. She continues to tolerate the 3 layer compression wrap well. She denies signs of infection. 11/30; patient presents for follow-up. She has no issues or complaints today. 11/10; patient arrived in clinic today for nurse visit accompanied by her daughter from [place]. The daughter had multiple questions so we turned this into doctors visit. Apparently after the last time I saw this woman in October she went to see Dr. Ronnald Ramp but he did not take the wraps off. She previously was treated with Cemiplimab for her squamous cell carcinoma. She did not receive radiation to her legs. I had wanted Dr. Ronnald Ramp to look at this because of the exceptionally damaged skin on her lower legs bilaterally. She has odd looking wounds on the left medial lower leg also extending posteriorly which we have not made a lot of progress on. But she also has a raised hyperkeratotic nodules on the right anterior tibial area plaques on the left medial lower thigh. These are not areas that are under compression. We have been using silver alginate on any open areas Liberal TCA under 3 layer compression. 11/17; patient presents for follow-up. She had her wraps taken off yesterday for her dermatology appointment. She reports excessive  weeping to her legs bilaterally after the wraps were taken off. She currently denies signs of infection. 12/12/2020 upon evaluation today patient appears to be doing about the same in regard to her wounds. She was actually started on Cipro after having biopsies apparently at her dermatology  clinic she does not have the results back from the actual biopsy but the culture did return and apparently they placed her on the Cipro she started that just this morning. Other than that her legs appear to be doing okay at this time. 12/1; patient presents for follow-up. She has no issues or complaints today. She has started Lasix 40 mg daily to help with her bilateral leg swelling. 12/8; patient presents for follow-up. She has no issues or complaints today. 12/15; patient presents for 1 week follow-up. She has no issues or complaints today. 12/22; patient presents for follow-up. She has no issues or complaints today. 1/5; patient presents for follow-up. She has no issues or complaints today. She has tolerated the compression wraps well. 1/12; patient presents for follow-up. She is tolerated the compression wrap well and has no issues or complaints today. She denies signs of infection. 1/19; patient presents for follow-up. She took the compression wraps off 1 to 2 days ago to take a shower and did not have compression wraps replaced. She reports increased blistering throughout her legs bilaterally. She currently denies signs of infection. 1/26; patient presents for follow-up. She has no issues or complaints today. She tolerated the compression wraps well. She has not heard from oncology to schedule an appointment. 2/2; patient presents for follow-up. She has no issues or complaints today. She continues to tolerate the compression wrap well. 2/16; patient presents for follow-up. She has no issues or complaints today. 2/23; patient presents for follow-up. She has no issues or complaints today. Tomorrow she has an appointment with oncology to look at the suspicious lesion on her right lower extremity. She currently denies signs of infection. 3/2; patient presents for follow-up. She has been using her juxta light compression to the right lower extremity however there has been increased swelling  and opening of wounds throughout the legs with weeping. She currently denies signs of infection. She had no issues with the compression wrap to the left lower extremity. 3/16; patient presents for follow-up. She has no issues or complaints today. She states that she has been restarted on infusions for her squamous cell carcinoma of her legs. She has also been increased on Lasix to 40 mg twice daily. She has noticed a decrease in swelling to her legs. 3/23; patient presents for follow-up. She starts her second infusion next week for her squamous cell carcinoma lesions to her legs. She reports no issues with the compression wraps. 3/30; patient presents for follow-up. She has no issues or complaints today. 4/6; patient presents for follow-up. Her left lower extremity wounds have healed. She has her juxta light compression with her today. She has no issues or complaints today. 4/13; this is a patient we actually discharged to juxta lite stockings on the left leg the last time she was here. She came in on Monday for [place] to look at the leg and a nurse visit. She had massive swelling and numerous blisters and wound reopening. We put her back in 3 layer compression although I did not generate a note. Silver alginate on the wounded areas. We have also been doing the same on the right. In the meantime she is obtained her external compression pumps that were  previously ordered and she started to use them. She has skin cancers that are obvious on the right leg x2 her appointment with Dr. Jarome Matin of dermatology is on May 5.. She is also receiving weekly chemotherapy for recurrent presumably squamous cell carcinomas apparently has had 2 treatments 4/20; patient presents for follow-up. She has her new juxta lite compressions today. She continues to have open wounds to the left lower extremity. She has follow-up with dermatology in 2 weeks. She continues to have IV infusions for her squamous cell carcinoma  lesions on her legs. 4/28; the patient has bilateral lower extremity wounds predominantly on the right lateral upper leg, right anterior lower leg which in itself is probably a recurrent cancer as well as the left lateral lower leg. She has recurrent squamous cell carcinoma currently being treated with an IV infusion. She also has scattered nodules on her upper lower legs and thighs I am not certain of all of this is felt to be malignant as well We have been using silver alginate on the open areas wrapping her legs. She also has her external compression pumps at home but I am not clear that she is going to use these 5/2; patient presents for follow-up. She has not been taking her diuretics as prescribed. She sees Dr. Dian Situ, dermatology at the end of the week. She tolerated the compression wraps well today. She has her juxta lite compressions. 5/8; patient presents for follow-up. She saw Dr. Dian Situ, dermatology and she has nothing to report on. She has been using her juxta lite compression to the right lower extremity. She has tolerated the compression wrap well in the left lower extremity. She has not been taking her diuretic. She is scheduled to continue her infusions for her squamous cell carcinoma on her legs. 5/18; patient presents for follow-up. We wrapped her in 3 layer compression at last clinic visit. She has no issues or complaints today. 5/22; patient presents for follow-up. She has been wrapped in 3 layer compression bilaterally to her lower extremities over the past week. She has been scratching at the top of the wrap and picking at her skin. This area has increased warmth and erythema. 5/30; patient presents for follow-up. She completed Keflex with resolution of her symptoms to the right lower extremity. She no longer has increased warmth or erythema. She continues her infusions for her squamous cell carcinoma lesions. We continue to wrap her in 3 layer compression with silver alginate.  She currently denies signs of infection. 6/8; the patient went to see Dr. Ronnald Ramp of dermatology 2 days ago unfortunately we do not have his notes. They removed her compression wraps of course but did not adequately replace them. She comes in today with 3 wounds on the right lateral upper calf 1 medially below the obvious squamous cell carcinoma there is a large necrotic wound on the left lateral tibial area on the left and several blisters. The patient remains on Cemiplimad infusions for the squamous cell carcinoma bilaterally. She is not felt to be a good candidate for radiation. Some of the skin changes superiorly in the upper legs bilaterally according to Dr. Ronnald Ramp related to these infusions the same like fleshy nodules to me not blisters 6/13; patient presents for follow-up. We have been using silver alginate under 3 layer compression bilaterally. She has no issues or complaints today. 6/19; patient presents for follow-up. She will see her oncologist in 3 days and next week her dermatologist. She has no new complaints today.  We are using silver alginate under 3 layer compression bilaterally. Electronic Signature(s) Signed: 07/08/2021 4:01:15 PM By: Kalman Shan DO Entered By: Kalman Shan on 07/08/2021 15:13:19 -------------------------------------------------------------------------------- Physical Exam Details Patient Name: Date of Service: [name], Harvey A. 07/08/2021 1:45 PM Medical Record Number: [id number] Patient Account Number: [id number] Date of Birth/Sex: Treating RN: 1925-06-19 (88 y.o. [name], [name] Primary Care Provider: [name] Other Clinician: Referring Provider: Treating Provider/Extender: [name] in Treatment: 32 Constitutional respirations regular, non-labored and within target range for patient.. Cardiovascular 2+ dorsalis pedis/posterior tibialis pulses. Psychiatric pleasant and  cooperative. Notes Multiple small scattered open wounds to her lower extremities bilaterally limited to skin breakdown. 3 areas suspicious for malignancy. Good edema control. No weeping noted. No surrounding signs of infection. Electronic Signature(s) Signed: 07/08/2021 4:01:15 PM By: Kalman Shan DO Entered By: Kalman Shan on 07/08/2021 15:15:25 -------------------------------------------------------------------------------- Physician Orders Details Patient Name: Date of Service: [name], [name] 07/08/2021 1:45 PM Medical Record Number: [id number] Patient Account Number: [id number] Date of Birth/Sex: Treating RN: [birth year] (88 y.o. [name], [name] Primary Care Provider: [name] Other Clinician: Referring Provider: Treating Provider/Extender: [name] in Treatment: 73 Verbal / Phone Orders: No Diagnosis Coding ICD-10 Coding Code Description C44.92 Squamous cell carcinoma of skin, unspecified L97.812 Non-pressure chronic ulcer of other part of right lower leg with fat layer exposed L97.822 Non-pressure chronic ulcer of other part of left lower leg with fat layer exposed I89.0 Lymphedema, not elsewhere classified I87.313 Chronic venous hypertension (idiopathic) with ulcer of bilateral lower extremity Follow-up Appointments ppointment in 1 week. - Dr. Heber [place] and [place], Room 8 07/18/2021 Thursday 1115am. (front office to move 07/16/2021 to 07/18/2021) Return A Dr. Heber [place] and [place], Room 8 07/25/2021 Thursday 130pm Other: - Dermatology specialists keep that appt time on 07/17/2021. Bathing/ Shower/ Hygiene May shower with protection but do not get wound dressing(s) wet. - Use cast protector Edema Control - Lymphedema / SCD / Other Lymphedema Pumps. Use Lymphedema pumps on leg(s) 2-3 times a day for 45-60 minutes. If wearing any wraps or hose, do not remove them. Continue exercising as instructed. - use one hour each time twice a  day. Elevate legs to the level of the heart or above for 30 minutes daily and/or when sitting, a frequency of: - elevate the legs throughout the day heart level if possible. Avoid standing for long periods of time. Exercise regularly Additional Orders / Instructions Follow Nutritious Diet Wound Treatment Wound #20 - Lower Leg Wound Laterality: Right, Circumferential Cleanser: Soap and Water 1 x Per Week/30 Days Discharge Instructions: May shower and wash wound with dial antibacterial soap and water prior to dressing change. Cleanser: Wound Cleanser 1 x Per Week/30 Days Discharge Instructions: Cleanse the wound with wound cleanser prior to applying a clean dressing using gauze sponges, not tissue or cotton balls. Peri-Wound Care: Sween Lotion (Moisturizing lotion) 1 x Per Week/30 Days Discharge Instructions: Apply moisturizing lotion as directed Prim Dressing: KerraCel Ag Gelling Fiber Dressing, 4x5 in (silver alginate) 1 x Per Week/30 Days ary Discharge Instructions: Apply silver alginate to wound bed as instructed Secondary Dressing: ABD Pad, 8x10 1 x Per Week/30 Days Discharge Instructions: Apply over primary dressing as directed. Secondary Dressing: Woven Gauze Sponge, Non-Sterile 4x4 in 1 x Per Week/30 Days Discharge Instructions: Apply over primary dressing as directed. Compression Wrap: ThreePress (3 layer compression wrap) 1 x Per Week/30 Days Discharge Instructions: Apply three layer compression as directed. Wound #7RR -  Lower Leg Wound Laterality: Left, Circumferential Cleanser: Soap and Water 1 x Per Week/30 Days Discharge Instructions: May shower and wash wound with dial antibacterial soap and water prior to dressing change. Cleanser: Wound Cleanser 1 x Per Week/30 Days Discharge Instructions: Cleanse the wound with wound cleanser prior to applying a clean dressing using gauze sponges, not tissue or cotton balls. Peri-Wound Care: Sween Lotion (Moisturizing lotion) 1 x Per  Week/30 Days Discharge Instructions: Apply moisturizing lotion as directed Prim Dressing: KerraCel Ag Gelling Fiber Dressing, 4x5 in (silver alginate) 1 x Per Week/30 Days ary Discharge Instructions: Apply silver alginate to wound bed as instructed Secondary Dressing: ABD Pad, 8x10 1 x Per Week/30 Days Discharge Instructions: Apply over primary dressing as directed. Secondary Dressing: Woven Gauze Sponge, Non-Sterile 4x4 in 1 x Per Week/30 Days Discharge Instructions: Apply over primary dressing as directed. Compression Wrap: ThreePress (3 layer compression wrap) 1 x Per Week/30 Days Discharge Instructions: Apply three layer compression as directed. Electronic Signature(s) Signed: 07/08/2021 4:01:15 PM By: Kalman Shan DO Entered By: Kalman Shan on 07/08/2021 15:15:36 -------------------------------------------------------------------------------- Problem List Details Patient Name: Date of Service: [name], [place]. 07/08/2021 1:45 PM Medical Record Number: [id number] Patient Account Number: [id number] Date of Birth/Sex: Treating RN: [birth year] (88 y.o. [name], [name] Primary Care Provider: [name] Other Clinician: Referring Provider: Treating Provider/Extender: [name] in Treatment: 60 Active Problems ICD-10 Encounter Code Description Active Date MDM Diagnosis C44.92 Squamous cell carcinoma of skin, unspecified 06/11/2020 No Yes L97.812 Non-pressure chronic ulcer of other part of right lower leg with fat layer 06/11/2020 No Yes exposed L97.822 Non-pressure chronic ulcer of other part of left lower leg with fat layer exposed5/23/2022 No Yes I89.0 Lymphedema, not elsewhere classified 01/31/2021 No Yes I87.313 Chronic venous hypertension (idiopathic) with ulcer of bilateral lower extremity 03/21/2021 No Yes Inactive Problems Resolved Problems Electronic Signature(s) Signed: 07/08/2021 4:01:15 PM By: Kalman Shan  DO Entered By: Kalman Shan on 07/08/2021 15:12:22 -------------------------------------------------------------------------------- Progress Note Details Patient Name: Date of Service: [name], [name] 07/08/2021 1:45 PM Medical Record Number: [id number] Patient Account Number: [id number] Date of Birth/Sex: Treating RN: [birth year] (88 y.o. [name], [name] Primary Care Provider: [name] Other Clinician: Referring Provider: Treating Provider/Extender: [name] in Treatment: 36 Subjective Chief Complaint Information obtained from Patient Bilateral lower extremity wounds that have been biopsied and positive for squamous cell carcinoma Bilateral lower extremity wounds due to chronic venous insufficiency/lymphedema History of Present Illness (HPI) The following HPI elements were documented for the patient's wound: Location: left leg Admission 5/23 Ms. 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 She states that the wound on the right has been present for a year. The 1 on the left has opened up 1 month ago. She is followed with oncology for this issue as she had biopsies that showed squamous cell carcinoma. She is also seeing radiation oncology for treatment options. She presents today because she would like for her wounds to be healed by [place]. She currently denies signs of infection. 6/1; patient presents for 1 week follow-up. She states she has tolerated the leg wraps well. She states these do not bother her and is happy to continue with them. She is scheduled to see her oncologist today to go  over treatment options for the bilateral lower extremity squamous cell carcinoma. Radiation is currently not a recommended  option. Patient states she overall feels well. 6/22; patient presents for 3-week follow-up. She has tolerated the wraps well until her last wrap where she states they were uncomfortable. She attributes this to the home health nurse. She denies signs of infection. She has started her first treatment of antibody infusions for her Bilateral lower extremity squamous cell carcinoma. She has no complaints today. 7/21; patient presents for 1 month follow-up. Unfortunately she has not had good experience with her wrap changes with home health. She would like to do her own dressing changes. She continues to do her antibody infusions. She denies signs of infection. 7/28; patient presents for 1 week follow-up. At last clinic visit she was switched to daily dressing changes due to issues with the wrap and home health placing them. Unfortunately she has developed weeping to her legs bilaterally. She would like to be placed in wraps today. She would also like to follow with [place] weekly for wrap changes instead of having home health change them. She denies signs of infection. 8/4; patient presents for 1 week follow-up. She has tolerated the Kerlix/Coban wraps well. She no longer has weeping to her legs. She took the wrap off 1 day before coming in to be able to take a shower. She has no issues or complaints today. She denies signs of infection. 8/18; patient presents for follow-up. Patient has tolerated the wraps well. She brought her Velcro compression wraps today. She has no issues or complaints today. She had her chemotherapy infusion yesterday without issues. She denies signs of infection. 8/25; patient presents for follow-up. She used her juxta lite compressions for the past week. It is unclear if she is able to put these on correctly since she states she has a hard time getting them to look right. She reports 2 open wounds. She currently denies signs of infection. 9/1; patient presents for 1 week follow-up. She  has 1 open wound. She tolerated the compression wraps well. She currently denies signs of infection. 9/8; patient presents for 1 week follow-up. She has 2 open wounds 1 on each leg. She has tolerated the compression wrap well. She currently denies signs of infection. 9/15; patient presents for 1 week follow-up. She now has 3 wounds. 2 on the left and 1 on the right. She continues to tolerate the compression wrap well. She currently denies signs of infection. She has obtained furosemide by her primary care physician and would like to discuss when to take this. 9/22; patient presents for 1 week follow-up. She has scattered wounds on her lower extremities bilaterally. She did take furosemide twice in the past week. She does not recall having to urinate more frequently. She tolerated the 3 layer compression well. She denies signs of infection. 10/13; patient has not been here recently because of [place]. Apparently the facility was only putting gauze on her legs. This is a patient I do not normally see. 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 Ronnald Ramp at [facility] dermatology. She is quite convincing that she did not have radiation to her lower legs. It is likely she also has significant chronic venous insufficiency stasis dermatitis. We have been using silver alginate under kerlix Coban. She has obvious open areas medially on the right and areas on the left. She also has areas of extensive dry flaking  adherent areas on the right and to a lesser extent on the left anterior. Nodular areas on the left lateral lower leg left posterior calf and right mid calf medially. 10/21; patient presents for follow-up. She has no issues or complaints today. She has tolerated the 3 layer compression well bilaterally. She denies signs of infection. 10/27; patient presents for follow-up. She continues to tolerate the 3 layer compression wrap well.  She denies signs of infection. 11/30; patient presents for follow-up. She has no issues or complaints today. 11/10; patient arrived in clinic today for nurse visit accompanied by her daughter from [place]. The daughter had multiple questions so we turned this into doctors visit. Apparently after the last time I saw this woman in October she went to see Dr. Ronnald Ramp but he did not take the wraps off. She previously was treated with Cemiplimab for her squamous cell carcinoma. She did not receive radiation to her legs. I had wanted Dr. Ronnald Ramp to look at this because of the exceptionally damaged skin on her lower legs bilaterally. She has odd looking wounds on the left medial lower leg also extending posteriorly which we have not made a lot of progress on. But she also has a raised hyperkeratotic nodules on the right anterior tibial area plaques on the left medial lower thigh. These are not areas that are under compression. We have been using silver alginate on any open areas Liberal TCA under 3 layer compression. 11/17; patient presents for follow-up. She had her wraps taken off yesterday for her dermatology appointment. She reports excessive weeping to her legs bilaterally after the wraps were taken off. She currently denies signs of infection. 12/12/2020 upon evaluation today patient appears to be doing about the same in regard to her wounds. She was actually started on Cipro after having biopsies apparently at her dermatology clinic she does not have the results back from the actual biopsy but the culture did return and apparently they placed her on the Cipro she started that just this morning. Other than that her legs appear to be doing okay at this time. 12/1; patient presents for follow-up. She has no issues or complaints today. She has started Lasix 40 mg daily to help with her bilateral leg swelling. 12/8; patient presents for follow-up. She has no issues or complaints today. 12/15; patient presents  for 1 week follow-up. She has no issues or complaints today. 12/22; patient presents for follow-up. She has no issues or complaints today. 1/5; patient presents for follow-up. She has no issues or complaints today. She has tolerated the compression wraps well. 1/12; patient presents for follow-up. She is tolerated the compression wrap well and has no issues or complaints today. She denies signs of infection. 1/19; patient presents for follow-up. She took the compression wraps off 1 to 2 days ago to take a shower and did not have compression wraps replaced. She reports increased blistering throughout her legs bilaterally. She currently denies signs of infection. 1/26; patient presents for follow-up. She has no issues or complaints today. She tolerated the compression wraps well. She has not heard from oncology to schedule an appointment. 2/2; patient presents for follow-up. She has no issues or complaints today. She continues to tolerate the compression wrap well. 2/16; patient presents for follow-up. She has no issues or complaints today. 2/23; patient presents for follow-up. She has no issues or complaints today. Tomorrow she has an appointment with oncology to look at the suspicious lesion on her right lower extremity. She currently  denies signs of infection. 3/2; patient presents for follow-up. She has been using her juxta light compression to the right lower extremity however there has been increased swelling and opening of wounds throughout the legs with weeping. She currently denies signs of infection. She had no issues with the compression wrap to the left lower extremity. 3/16; patient presents for follow-up. She has no issues or complaints today. She states that she has been restarted on infusions for her squamous cell carcinoma of her legs. She has also been increased on Lasix to 40 mg twice daily. She has noticed a decrease in swelling to her legs. 3/23; patient presents for follow-up.  She starts her second infusion next week for her squamous cell carcinoma lesions to her legs. She reports no issues with the compression wraps. 3/30; patient presents for follow-up. She has no issues or complaints today. 4/6; patient presents for follow-up. Her left lower extremity wounds have healed. She has her juxta light compression with her today. She has no issues or complaints today. 4/13; this is a patient we actually discharged to juxta lite stockings on the left leg the last time she was here. She came in on Monday for [place] to look at the leg and a nurse visit. She had massive swelling and numerous blisters and wound reopening. We put her back in 3 layer compression although I did not generate a note. Silver alginate on the wounded areas. We have also been doing the same on the right. In the meantime she is obtained her external compression pumps that were previously ordered and she started to use them. She has skin cancers that are obvious on the right leg x2 her appointment with Dr. Jarome Matin of dermatology is on May 5.. She is also receiving weekly chemotherapy for recurrent presumably squamous cell carcinomas apparently has had 2 treatments 4/20; patient presents for follow-up. She has her new juxta lite compressions today. She continues to have open wounds to the left lower extremity. She has follow-up with dermatology in 2 weeks. She continues to have IV infusions for her squamous cell carcinoma lesions on her legs. 4/28; the patient has bilateral lower extremity wounds predominantly on the right lateral upper leg, right anterior lower leg which in itself is probably a recurrent cancer as well as the left lateral lower leg. She has recurrent squamous cell carcinoma currently being treated with an IV infusion. She also has scattered nodules on her upper lower legs and thighs I am not certain of all of this is felt to be malignant as well We have been using silver alginate on the open  areas wrapping her legs. She also has her external compression pumps at home but I am not clear that she is going to use these 5/2; patient presents for follow-up. She has not been taking her diuretics as prescribed. She sees Dr. Dian Situ, dermatology at the end of the week. She tolerated the compression wraps well today. She has her juxta lite compressions. 5/8; patient presents for follow-up. She saw Dr. Dian Situ, dermatology and she has nothing to report on. She has been using her juxta lite compression to the right lower extremity. She has tolerated the compression wrap well in the left lower extremity. She has not been taking her diuretic. She is scheduled to continue her infusions for her squamous cell carcinoma on her legs. 5/18; patient presents for follow-up. We wrapped her in 3 layer compression at last clinic visit. She has no issues or complaints today. 5/22;  patient presents for follow-up. She has been wrapped in 3 layer compression bilaterally to her lower extremities over the past week. She has been scratching at the top of the wrap and picking at her skin. This area has increased warmth and erythema. 5/30; patient presents for follow-up. She completed Keflex with resolution of her symptoms to the right lower extremity. She no longer has increased warmth or erythema. She continues her infusions for her squamous cell carcinoma lesions. We continue to wrap her in 3 layer compression with silver alginate. She currently denies signs of infection. 6/8; the patient went to see Dr. Ronnald Ramp of dermatology 2 days ago unfortunately we do not have his notes. They removed her compression wraps of course but did not adequately replace them. She comes in today with 3 wounds on the right lateral upper calf 1 medially below the obvious squamous cell carcinoma there is a large necrotic wound on the left lateral tibial area on the left and several blisters. The patient remains on Cemiplimad infusions for the  squamous cell carcinoma bilaterally. She is not felt to be a good candidate for radiation. Some of the skin changes superiorly in the upper legs bilaterally according to Dr. Ronnald Ramp related to these infusions the same like fleshy nodules to me not blisters 6/13; patient presents for follow-up. We have been using silver alginate under 3 layer compression bilaterally. She has no issues or complaints today. 6/19; patient presents for follow-up. She will see her oncologist in 3 days and next week her dermatologist. She has no new complaints today. We are using silver alginate under 3 layer compression bilaterally. Patient History Information obtained from Patient. Family History Unknown History. Social History Never smoker, Marital Status - Single, Alcohol Use - Never, Drug Use - No History, Caffeine Use - Never. Medical History Eyes Denies history of Cataracts, Optic Neuritis Ear/Nose/Mouth/Throat Denies history of Chronic sinus problems/congestion, Middle ear problems Hematologic/Lymphatic Patient has history of Anemia Denies history of Hemophilia, Human Immunodeficiency Virus, Lymphedema, Sickle Cell Disease Respiratory Denies history of Aspiration, Asthma, Chronic Obstructive Pulmonary Disease (COPD), Pneumothorax, Sleep Apnea, Tuberculosis Cardiovascular Patient has history of Arrhythmia - Atrial Flutter, A fibb, Congestive Heart Failure, Hypertension Denies history of Angina, Coronary Artery Disease, Deep Vein Thrombosis, Hypotension, Myocardial Infarction, Peripheral Arterial Disease, Peripheral Venous Disease, Phlebitis, Vasculitis Gastrointestinal Patient has history of Colitis Denies history of Cirrhosis , Crohnoos, Hepatitis A, Hepatitis B, Hepatitis C Endocrine Denies history of Type I Diabetes, Type II Diabetes Genitourinary Denies history of End Stage Renal Disease Immunological Denies history of Lupus Erythematosus, Raynaudoos, Scleroderma Integumentary (Skin) Denies  history of History of Burn Musculoskeletal Patient has history of Osteoarthritis Denies history of Gout, Rheumatoid Arthritis, Osteomyelitis Neurologic Denies history of Dementia, Neuropathy, Quadriplegia, Paraplegia, Seizure Disorder Oncologic Denies history of Received Chemotherapy, Received Radiation Hospitalization/Surgery History - removal of rod left leg. 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 Vitals Time Taken: 2:20 PM, Height: 65 in, Weight: 163 lbs, BMI: 27.1, Temperature: 97.4 F, Pulse: 66 bpm, Respiratory Rate: 20 breaths/min, Blood Pressure: 111/68 mmHg. Cardiovascular 2+ dorsalis pedis/posterior tibialis pulses. Psychiatric pleasant and cooperative. General Notes: Multiple small scattered open wounds to her lower extremities bilaterally limited to skin breakdown. 3 areas suspicious for malignancy. Good edema control. No weeping noted. No surrounding signs of infection. Integumentary (Hair, Skin) Wound #20 status is Open. Original cause of wound was Gradually  Appeared. The date acquired was: 06/25/2021. The wound has been in treatment 1 weeks. The wound is located on the Right,Circumferential Lower Leg. The wound measures 28cm length x 31cm width x 0.1cm depth; 681.726cm^2 area and 68.173cm^3 volume. There is Fat Layer (Subcutaneous Tissue) exposed. There is a large amount of serosanguineous drainage noted. The wound margin is distinct with the outline attached to the wound base. There is large (67-100%) pink granulation within the wound bed. Wound #7RR status is Open. Original cause of wound was Blister. The date acquired was: 04/20/2020. The wound has been in treatment 56 weeks. The wound is located on the Left,Circumferential Lower Leg. The wound  measures 2.4cm length x 16cm width x 0.1cm depth; 30.159cm^2 area and 3.016cm^3 volume. There is Fat Layer (Subcutaneous Tissue) exposed. There is no tunneling or undermining noted. There is a medium amount of serosanguineous drainage noted. The wound margin is distinct with the outline attached to the wound base. There is large (67-100%) red, pink granulation within the wound bed. There is no necrotic tissue within the wound bed. Assessment Active Problems ICD-10 Squamous cell carcinoma of skin, unspecified Non-pressure chronic ulcer of other part of right lower leg with fat layer exposed Non-pressure chronic ulcer of other part of left lower leg with fat layer exposed Lymphedema, not elsewhere classified Chronic venous hypertension (idiopathic) with ulcer of bilateral lower extremity Patient's wounds are stable. She is seeing dermatology and oncology in the next 1 to 2 weeks. They will reassess the wounds. I recommended continuing with silver alginate under 3 layer compression. This is a palliative wound care case. Procedures Wound #20 Pre-procedure diagnosis of Wound #20 is a Lymphedema located on the Right,Circumferential Lower Leg . There was a Three Layer Compression Therapy Procedure by Deon Pilling, RN. Post procedure Diagnosis Wound #20: Same as Pre-Procedure Wound #7RR Pre-procedure diagnosis of Wound #7RR is a Malignant Wound located on the Left,Circumferential Lower Leg . There was a Three Layer Compression Therapy Procedure by Deon Pilling, RN. Post procedure Diagnosis Wound #7RR: Same as Pre-Procedure Plan Follow-up Appointments: Return Appointment in 1 week. - Dr. Heber [facility] [facility] [place], Room 8 07/18/2021 Thursday 1115am. (front office to move 07/16/2021 to 07/18/2021) Dr. Heber [place] [facility] [place], Room 8 07/25/2021 Thursday 130pm Other: - Dermatology specialists keep that appt time on 07/17/2021. Bathing/ Shower/ Hygiene: May shower with protection but do not get wound dressing(s) wet. -  Use cast protector Edema Control - Lymphedema / SCD / Other: Lymphedema Pumps. Use Lymphedema pumps on leg(s) 2-3 times a day for 45-60 minutes. If wearing any wraps or hose, do not remove them. Continue exercising as instructed. - use one hour each time twice a day. Elevate legs to the level of the heart or above for 30 minutes daily and/or when sitting, a frequency of: - elevate the legs throughout the day heart level if possible. Avoid standing for long periods of time. Exercise regularly Additional Orders / Instructions: Follow Nutritious Diet WOUND #20: - Lower Leg Wound Laterality: Right, Circumferential Cleanser: Soap and Water 1 x Per Week/30 Days Discharge Instructions: May shower and wash wound with dial antibacterial soap and water prior to dressing change. Cleanser: Wound Cleanser 1 x Per Week/30 Days Discharge Instructions: Cleanse the wound with wound cleanser prior to applying a clean dressing using gauze sponges, not tissue or cotton balls. Peri-Wound Care: Sween Lotion (Moisturizing lotion) 1 x Per Week/30 Days Discharge Instructions: Apply moisturizing lotion as directed Prim Dressing: KerraCel Ag Gelling Fiber Dressing, 4x5 in (silver alginate)  1 x Per Week/30 Days ary Discharge Instructions: Apply silver alginate to wound bed as instructed Secondary Dressing: ABD Pad, 8x10 1 x Per Week/30 Days Discharge Instructions: Apply over primary dressing as directed. Secondary Dressing: Woven Gauze Sponge, Non-Sterile 4x4 in 1 x Per Week/30 Days Discharge Instructions: Apply over primary dressing as directed. Com pression Wrap: ThreePress (3 layer compression wrap) 1 x Per Week/30 Days Discharge Instructions: Apply three layer compression as directed. WOUND #7RR: - Lower Leg Wound Laterality: Left, Circumferential Cleanser: Soap and Water 1 x Per Week/30 Days Discharge Instructions: May shower and wash wound with dial antibacterial soap and water prior to dressing  change. Cleanser: Wound Cleanser 1 x Per Week/30 Days Discharge Instructions: Cleanse the wound with wound cleanser prior to applying a clean dressing using gauze sponges, not tissue or cotton balls. Peri-Wound Care: Sween Lotion (Moisturizing lotion) 1 x Per Week/30 Days Discharge Instructions: Apply moisturizing lotion as directed Prim Dressing: KerraCel Ag Gelling Fiber Dressing, 4x5 in (silver alginate) 1 x Per Week/30 Days ary Discharge Instructions: Apply silver alginate to wound bed as instructed Secondary Dressing: ABD Pad, 8x10 1 x Per Week/30 Days Discharge Instructions: Apply over primary dressing as directed. Secondary Dressing: Woven Gauze Sponge, Non-Sterile 4x4 in 1 x Per Week/30 Days Discharge Instructions: Apply over primary dressing as directed. Com pression Wrap: ThreePress (3 layer compression wrap) 1 x Per Week/30 Days Discharge Instructions: Apply three layer compression as directed. 1. Silver alginate under 3 layer compression 2. Follow-up in 1 week Electronic Signature(s) Signed: 07/08/2021 4:01:15 PM By: Kalman Shan DO Entered By: Kalman Shan on 07/08/2021 15:17:28 -------------------------------------------------------------------------------- HxROS Details Patient Name: Date of Service: [name], Mckynna A. 07/08/2021 1:45 PM Medical Record Number: [id number] Patient Account Number: [id number] Date of Birth/Sex: Treating RN: 1925-11-15 (88 y.o. [name] Primary Care Provider: [name] Other Clinician: Referring Provider: Treating Provider/Extender: [name] in Treatment: 73 Information Obtained From Patient Eyes Medical History: Negative for: Cataracts; Optic Neuritis Ear/Nose/Mouth/Throat Medical History: Negative for: Chronic sinus problems/congestion; Middle ear problems Hematologic/Lymphatic Medical History: Positive for: Anemia Negative for: Hemophilia; Human Immunodeficiency  Virus; Lymphedema; Sickle Cell Disease Respiratory Medical History: Negative for: Aspiration; Asthma; Chronic Obstructive Pulmonary Disease (COPD); Pneumothorax; Sleep Apnea; Tuberculosis Cardiovascular Medical History: Positive for: Arrhythmia - Atrial Flutter, A fibb; Congestive Heart Failure; Hypertension Negative for: Angina; Coronary Artery Disease; Deep Vein Thrombosis; Hypotension; Myocardial Infarction; Peripheral Arterial Disease; Peripheral Venous Disease; Phlebitis; Vasculitis Past Medical History Notes: hyperlipidemia Gastrointestinal Medical History: Positive for: Colitis Negative for: Cirrhosis ; Crohns; Hepatitis A; Hepatitis B; Hepatitis C Endocrine Medical History: Negative for: Type I Diabetes; Type II Diabetes Past Medical History Notes: hypothyroidism Genitourinary Medical History: Negative for: End Stage Renal Disease Immunological Medical History: Negative for: Lupus Erythematosus; Raynauds; Scleroderma Integumentary (Skin) Medical History: Negative for: History of Burn Musculoskeletal Medical History: Positive for: Osteoarthritis Negative for: Gout; Rheumatoid Arthritis; Osteomyelitis Neurologic Medical History: Negative for: Dementia; Neuropathy; Quadriplegia; Paraplegia; Seizure Disorder Past Medical History Notes: lumbar spindylolysis Oncologic Medical History: Negative for: Received Chemotherapy; Received Radiation Past Medical History Notes: BLE squamous ceel carcionoma Immunizations Pneumococcal Vaccine: Received Pneumococcal Vaccination: Yes Received Pneumococcal Vaccination On or After 60th Birthday: No Implantable Devices None Hospitalization / Surgery History Type of Hospitalization/Surgery removal of rod left leg Family and Social History Unknown History: Yes; Never smoker; Marital Status - Single; Alcohol Use: Never; Drug Use: No History; Caffeine Use: Never; Financial Concerns: No; Food, Clothing or Shelter Needs: No; Support  System Lacking:  No; Transportation Concerns: No Electronic Signature(s) Signed: 07/08/2021 4:01:15 PM By: Kalman Shan DO Signed: 07/10/2021 4:06:51 PM By: Rhae Hammock RN Entered By: Kalman Shan on 07/08/2021 15:13:26 -------------------------------------------------------------------------------- SuperBill Details Patient Name: Date of Service: [name], [name] 07/08/2021 Medical Record Number: [id number] Patient Account Number: [id number] Date of Birth/Sex: Treating RN: 1925-08-06 (88 y.o. [name], [name] Primary Care Provider: [name] Other Clinician: Referring Provider: Treating Provider/Extender: [name] in Treatment: 78 Diagnosis Coding ICD-10 Codes Code Description C44.92 Squamous cell carcinoma of skin, unspecified L97.812 Non-pressure chronic ulcer of other part of right lower leg with fat layer exposed L97.822 Non-pressure chronic ulcer of other part of left lower leg with fat layer exposed I89.0 Lymphedema, not elsewhere classified I87.313 Chronic venous hypertension (idiopathic) with ulcer of bilateral lower extremity Facility Procedures CPT4: Code [id number] Description: 81 BILATERAL: Application of multi-layer venous compression system; leg (below knee), including ankle and t. Modifier: Quantity: 1 Physician Procedures : CPT4 Code Description Modifier [id number] - WC PHYS LEVEL 3 - EST PT ICD-10 Diagnosis Description C44.92 Squamous cell carcinoma of skin, unspecified L97.812 Non-pressure chronic ulcer of other part of right lower leg with fat layer exposed L97.822  Non-pressure chronic ulcer of other part of left lower leg with fat layer exposed I87.313 Chronic venous hypertension (idiopathic) with ulcer of bilateral lower extremity Quantity: 1 Electronic Signature(s) Signed: 07/08/2021 4:01:15 PM By: Kalman Shan DO Entered By: Kalman Shan on 07/08/2021 15:18:00

## 2021-07-10 NOTE — Progress Notes (Signed)
[name] ([id number]) , Visit Report for 07/08/2021 Arrival Information Details Patient Name: Date of Service: [name], [name]. 07/08/2021 1:45 PM Medical Record Number: [id number] Patient Account Number: [id number] Date of Birth/Sex: Treating RN: 06-Oct-1925 (88 y.o. [name], [name] Primary Care Philopater Mucha: [name] Other Clinician: Referring Brienne Liguori: Treating [name]/Extender: [name] in Treatment: 42 Visit Information History Since Last Visit Added or deleted any medications: No Patient Arrived: Walker Any new allergies or adverse reactions: No Arrival Time: 14:21 Had a fall or experienced change in No Accompanied By: self activities of daily living that may affect Transfer Assistance: None risk of falls: Patient Identification Verified: Yes Signs or symptoms of abuse/neglect since last visito No Secondary Verification Process Completed: Yes Hospitalized since last visit: No Patient Requires Transmission-Based No Implantable device outside of the clinic excluding No Precautions: cellular tissue based products placed in the center Patient Has Alerts: Yes since last visit: Patient Alerts: R ABI = Non compressible Has Dressing in Place as Prescribed: Yes L ABI = Non compressible Has Compression in Place as Prescribed: Yes Pain Present Now: No Notes Dr. Darrin Nipper dermatology appt 07/17/2021 Electronic Signature(s) Signed: 07/08/2021 5:44:49 PM By: Deon Pilling RN, BSN Entered By: Deon Pilling on 07/08/2021 14:22:33 -------------------------------------------------------------------------------- Compression Therapy Details Patient Name: Date of Service: [name], [name] 07/08/2021 1:45 PM Medical Record Number: [id number] Patient Account Number: [id number] Date of Birth/Sex: Treating RN: [birth year] (88 y.o. [name] Primary Care Leetta Hendriks: [name] Other Clinician: Referring [name]: Treating  [name]/Extender: [name] in Treatment: 75 Compression Therapy Performed for Wound Assessment: Wound #20 Right,Circumferential Lower Leg Performed By: Clinician Deon Pilling, RN Compression Type: Three Layer Post Procedure Diagnosis Same as Pre-procedure Electronic Signature(s) Signed: 07/08/2021 5:44:49 PM By: Deon Pilling RN, BSN Entered By: Deon Pilling on 07/08/2021 15:02:21 -------------------------------------------------------------------------------- Compression Therapy Details Patient Name: Date of Service: [name], [name] 07/08/2021 1:45 PM Medical Record Number: [id number] Patient Account Number: [id number] Date of Birth/Sex: Treating RN: 1925/04/06 (88 y.o. [name] Primary Care [name]: [name] Other Clinician: Referring [name]: Treating [name]/Extender: [name] in Treatment: 17 Compression Therapy Performed for Wound Assessment: Wound #7RR Left,Circumferential Lower Leg Performed By: Clinician Deon Pilling, RN Compression Type: Three Layer Post Procedure Diagnosis Same as Pre-procedure Electronic Signature(s) Signed: 07/08/2021 5:44:49 PM By: Deon Pilling RN, BSN Entered By: Deon Pilling on 07/08/2021 15:02:21 -------------------------------------------------------------------------------- Encounter Discharge Information Details Patient Name: Date of Service: [name], [name] 07/08/2021 1:45 PM Medical Record Number: [id number] Patient Account Number: [id number] Date of Birth/Sex: Treating RN: 06-21-1925 (88 y.o. [name] Primary Care Gian Ybarra: [name] Other Clinician: Referring Tyjae Issa: Treating [name]/Extender: [name] in Treatment: 81 Encounter Discharge Information Items Discharge Condition: Stable Ambulatory Status: Walker Discharge Destination: Home Transportation: Private  Auto Accompanied By: self Schedule Follow-up Appointment: Yes Clinical Summary of Care: Electronic Signature(s) Signed: 07/08/2021 5:44:49 PM By: Deon Pilling RN, BSN Entered By: Deon Pilling on 07/08/2021 15:07:18 -------------------------------------------------------------------------------- Lower Extremity Assessment Details Patient Name: Date of Service: [name], [name] 07/08/2021 1:45 PM Medical Record Number: [id number] Patient Account Number: [id number] Date of Birth/Sex: Treating RN: 01-31-1925 (88 y.o. [name] Primary Care [name]: [name] Other Clinician: Referring [name]: Treating [name]/Extender: [name] in Treatment: 56 Edema Assessment Assessed: [Left: Yes] [Right: Yes] Edema: [Left: Yes] [Right: Yes] Calf Left: Right: Point of Measurement: 30  cm From Medial Instep 32 cm 32.5 cm Ankle Left: Right: Point of Measurement: 9 cm From Medial Instep 22 cm 22 cm Vascular Assessment Pulses: Dorsalis Pedis Palpable: [Left:Yes] [Right:Yes] Electronic Signature(s) Signed: 07/08/2021 5:44:49 PM By: Deon Pilling RN, BSN Entered By: Deon Pilling on 07/08/2021 14:30:27 -------------------------------------------------------------------------------- Multi Wound Chart Details Patient Name: Date of Service: [name], [name] 07/08/2021 1:45 PM Medical Record Number: [id number] Patient Account Number: [id number] Date of Birth/Sex: Treating RN: [birth year] (88 y.o. [name], [name] Primary Care [name]: [name] Other Clinician: Referring Alyn Jurney: Treating Latresa Gasser/Extender: [name] in Treatment: 68 Vital Signs Height(in): 65 Pulse(bpm): 79 Weight(lbs): 163 Blood Pressure(mmHg): 111/68 Body Mass Index(BMI): 27.1 Temperature(F): 97.4 Respiratory Rate(breaths/min): 20 Photos: [N/A:N/A] Right, Circumferential Lower Leg Left, Circumferential Lower Leg  N/A Wound Location: Gradually Appeared Blister N/A Wounding Event: Lymphedema Malignant Wound N/A Primary Etiology: Anemia, Arrhythmia, Congestive Heart Anemia, Arrhythmia, Congestive Heart N/A Comorbid History: Failure, Hypertension, Colitis, Failure, Hypertension, Colitis, Osteoarthritis Osteoarthritis 06/25/2021 04/20/2020 N/A Date Acquired: 1 18 N/A Weeks of Treatment: Open Open N/A Wound Status: No Yes N/A Wound Recurrence: Yes Yes N/A Clustered Wound: N/A 2 N/A Clustered Quantity: 28x31x0.1 2.4x16x0.1 N/A Measurements L x W x D (cm) 681.726 30.159 N/A A (cm) : rea 68.173 3.016 N/A Volume (cm) : -71.50% -882.10% N/A % Reduction in Area: -71.50% -882.40% N/A % Reduction in Volume: Full Thickness Without Exposed Full Thickness Without Exposed N/A Classification: Support Structures Support Structures Large Medium N/A Exudate Amount: Serosanguineous Serosanguineous N/A Exudate Type: red, brown red, brown N/A Exudate Color: Distinct, outline attached Distinct, outline attached N/A Wound Margin: Large (67-100%) Large (67-100%) N/A Granulation Amount: Pink Red, Pink N/A Granulation Quality: N/A None Present (0%) N/A Necrotic Amount: Fat Layer (Subcutaneous Tissue): Yes Fat Layer (Subcutaneous Tissue): Yes N/A Exposed Structures: Fascia: No Fascia: No Tendon: No Tendon: No Muscle: No Muscle: No Joint: No Joint: No Bone: No Bone: No None Large (67-100%) N/A Epithelialization: Compression Therapy Compression Therapy N/A Procedures Performed: Treatment Notes Wound #20 (Lower Leg) Wound Laterality: Right, Circumferential Cleanser Soap and Water Discharge Instruction: May shower and wash wound with dial antibacterial soap and water prior to dressing change. Wound Cleanser Discharge Instruction: Cleanse the wound with wound cleanser prior to applying a clean dressing using gauze sponges, not tissue or cotton balls. Peri-Wound Care Sween Lotion  (Moisturizing lotion) Discharge Instruction: Apply moisturizing lotion as directed Topical Primary Dressing KerraCel Ag Gelling Fiber Dressing, 4x5 in (silver alginate) Discharge Instruction: Apply silver alginate to wound bed as instructed Secondary Dressing ABD Pad, 8x10 Discharge Instruction: Apply over primary dressing as directed. Woven Gauze Sponge, Non-Sterile 4x4 in Discharge Instruction: Apply over primary dressing as directed. Secured With Compression Wrap ThreePress (3 layer compression wrap) Discharge Instruction: Apply three layer compression as directed. Compression Stockings Add-Ons Wound #7RR (Lower Leg) Wound Laterality: Left, Circumferential Cleanser Soap and Water Discharge Instruction: May shower and wash wound with dial antibacterial soap and water prior to dressing change. Wound Cleanser Discharge Instruction: Cleanse the wound with wound cleanser prior to applying a clean dressing using gauze sponges, not tissue or cotton balls. Peri-Wound Care Sween Lotion (Moisturizing lotion) Discharge Instruction: Apply moisturizing lotion as directed Topical Primary Dressing KerraCel Ag Gelling Fiber Dressing, 4x5 in (silver alginate) Discharge Instruction: Apply silver alginate to wound bed as instructed Secondary Dressing ABD Pad, 8x10 Discharge Instruction: Apply over primary dressing as directed. Woven Gauze Sponge, Non-Sterile 4x4 in Discharge Instruction: Apply over primary dressing as directed. Secured With Compression  Wrap ThreePress (3 layer compression wrap) Discharge Instruction: Apply three layer compression as directed. Compression Stockings Add-Ons Electronic Signature(s) Signed: 07/08/2021 4:01:15 PM By: Kalman Shan DO Signed: 07/10/2021 4:06:51 PM By: Rhae Hammock RN Entered By: Kalman Shan on 07/08/2021 15:12:31 -------------------------------------------------------------------------------- Multi-Disciplinary Care Plan  Details Patient Name: Date of Service: [name], [place]. 07/08/2021 1:45 PM Medical Record Number: [id number] Patient Account Number: [id number] Date of Birth/Sex: Treating RN: 13-Aug-1925 (88 y.o. [name] Primary Care Deano Tomaszewski: [name] Other Clinician: Referring Infinity Jeffords: Treating Quanna Wittke/Extender: [name] in Treatment: 28 Multidisciplinary Care Plan reviewed with physician Active Inactive Venous Leg Ulcer Nursing Diagnoses: Knowledge deficit related to disease process and management Potential for venous Insuffiency (use before diagnosis confirmed) Goals: Patient will maintain optimal edema control Date Initiated: 11/01/2020 Target Resolution Date: 08/16/2021 Goal Status: Active Interventions: Assess peripheral edema status every visit. Compression as ordered Provide education on venous insufficiency Treatment Activities: Therapeutic compression applied : 11/01/2020 Notes: 12/27/20: Edema control ongoing 01/24/21: Edema control continues, using 3 layer compression Wound/Skin Impairment Nursing Diagnoses: Impaired tissue integrity Knowledge deficit related to ulceration/compromised skin integrity Goals: Patient/caregiver will verbalize understanding of skin care regimen Date Initiated: 06/11/2020 Target Resolution Date: 08/16/2021 Goal Status: Active Interventions: Assess patient/caregiver ability to obtain necessary supplies Assess patient/caregiver ability to perform ulcer/skin care regimen upon admission and as needed Assess ulceration(s) every visit Provide education on ulcer and skin care Notes: 10/11/20: Wound care regimen ongoing. 12/27/20: Wound care continues Electronic Signature(s) Signed: 07/08/2021 5:44:49 PM By: Deon Pilling RN, BSN Entered By: Deon Pilling on 07/08/2021 14:42:18 -------------------------------------------------------------------------------- Pain Assessment Details Patient Name: Date  of Service: [name], [place] A. 07/08/2021 1:45 PM Medical Record Number: [id number] Patient Account Number: [id number] Date of Birth/Sex: Treating RN: [birth year] (88 y.o. [name] Primary Care Dilraj Killgore: [name] Other Clinician: Referring May Ozment: Treating Joetta Delprado/Extender: [name] in Treatment: 14 Active Problems Location of Pain Severity and Description of Pain Patient Has Paino No Site Locations Rate the pain. Current Pain Level: 0 Pain Management and Medication Current Pain Management: Medication: No Cold Application: No Rest: No Massage: No Activity: No T.E.N.S.: No Heat Application: No Leg drop or elevation: No Is the Current Pain Management Adequate: Adequate How does your wound impact your activities of daily livingo Sleep: No Bathing: No Appetite: No Relationship With Others: No Bladder Continence: No Emotions: No Bowel Continence: No Work: No Toileting: No Drive: No Dressing: No Hobbies: No Engineer, maintenance) Signed: 07/08/2021 5:44:49 PM By: Deon Pilling RN, BSN Entered By: Deon Pilling on 07/08/2021 14:22:44 -------------------------------------------------------------------------------- Patient/Caregiver Education Details Patient Name: Date of Service: [name], Nykia A. 6/19/2023andnbsp1:45 PM Medical Record Number: [id number] Patient Account Number: [id number] Date of Birth/Gender: Treating RN: 1925/04/09 (88 y.o. [name] Primary Care Physician: [name] Other Clinician: Referring Physician: Treating Physician/Extender: [name] in Treatment: 2 Education Assessment Education Provided To: Patient Education Topics Provided Wound/Skin Impairment: Handouts: Skin Care Do's and Dont's Methods: Explain/Verbal Responses: Reinforcements needed Electronic Signature(s) Signed: 07/08/2021 5:44:49 PM By: Deon Pilling RN, BSN Entered  By: Deon Pilling on 07/08/2021 14:42:29 -------------------------------------------------------------------------------- Wound Assessment Details Patient Name: Date of Service: [name], [place]. 07/08/2021 1:45 PM Medical Record Number: [id number] Patient Account Number: [id number] Date of Birth/Sex: Treating RN: 04-Aug-1925 (88 y.o. [name] Primary Care Tahara Ruffini: [name] Other Clinician: Referring Laniece Hornbaker: Treating Kendyl Bissonnette/Extender: [name] in Treatment: 28 Wound Status Wound  Number: 20 Primary Lymphedema Etiology: Wound Location: Right, Circumferential Lower Leg Wound Open Wounding Event: Gradually Appeared Status: Date Acquired: 06/25/2021 Comorbid Anemia, Arrhythmia, Congestive Heart Failure, Hypertension, Weeks Of Treatment: 1 History: Colitis, Osteoarthritis Clustered Wound: Yes Photos Wound Measurements Length: (cm) 28 Width: (cm) 31 Depth: (cm) 0.1 Area: (cm) 681.726 Volume: (cm) 68.173 % Reduction in Area: -71.5% % Reduction in Volume: -71.5% Epithelialization: None Wound Description Classification: Full Thickness Without Exposed Support Structures Wound Margin: Distinct, outline attached Exudate Amount: Large Exudate Type: Serosanguineous Exudate Color: red, brown Foul Odor After Cleansing: No Slough/Fibrino No Wound Bed Granulation Amount: Large (67-100%) Exposed Structure Granulation Quality: Pink Fascia Exposed: No Fat Layer (Subcutaneous Tissue) Exposed: Yes Tendon Exposed: No Muscle Exposed: No Joint Exposed: No Bone Exposed: No Treatment Notes Wound #20 (Lower Leg) Wound Laterality: Right, Circumferential Cleanser Soap and Water Discharge Instruction: May shower and wash wound with dial antibacterial soap and water prior to dressing change. Wound Cleanser Discharge Instruction: Cleanse the wound with wound cleanser prior to applying a clean dressing using gauze sponges, not tissue or  cotton balls. Peri-Wound Care Sween Lotion (Moisturizing lotion) Discharge Instruction: Apply moisturizing lotion as directed Topical Primary Dressing KerraCel Ag Gelling Fiber Dressing, 4x5 in (silver alginate) Discharge Instruction: Apply silver alginate to wound bed as instructed Secondary Dressing ABD Pad, 8x10 Discharge Instruction: Apply over primary dressing as directed. Woven Gauze Sponge, Non-Sterile 4x4 in Discharge Instruction: Apply over primary dressing as directed. Secured With Compression Wrap ThreePress (3 layer compression wrap) Discharge Instruction: Apply three layer compression as directed. Compression Stockings Add-Ons Electronic Signature(s) Signed: 07/08/2021 5:44:49 PM By: Deon Pilling RN, BSN Entered By: Deon Pilling on 07/08/2021 14:30:09 -------------------------------------------------------------------------------- Wound Assessment Details Patient Name: Date of Service: [name], [place] A. 07/08/2021 1:45 PM Medical Record Number: [id number] Patient Account Number: [id number] Date of Birth/Sex: Treating RN: 02/04/1925 (88 y.o. [name], [name] Primary Care Ricki Clack: [name] Other Clinician: Referring Stuart Guillen: Treating Jamesetta Greenhalgh/Extender: [name] in Treatment: 67 Wound Status Wound Number: 7RR Primary Malignant Wound Etiology: Wound Location: Left, Circumferential Lower Leg Wound Open Wounding Event: Blister Status: Date Acquired: 04/20/2020 Comorbid Anemia, Arrhythmia, Congestive Heart Failure, Hypertension, Weeks Of Treatment: 56 History: Colitis, Osteoarthritis Clustered Wound: Yes Photos Wound Measurements Length: (cm) 2.4 Width: (cm) 16 Depth: (cm) 0.1 Clustered Quantity: 2 Area: (cm) 30.159 Volume: (cm) 3.016 % Reduction in Area: -882.1% % Reduction in Volume: -882.4% Epithelialization: Large (67-100%) Tunneling: No Undermining: No Wound Description Classification: Full  Thickness Without Exposed Support Structures Wound Margin: Distinct, outline attached Exudate Amount: Medium Exudate Type: Serosanguineous Exudate Color: red, brown Foul Odor After Cleansing: No Slough/Fibrino No Wound Bed Granulation Amount: Large (67-100%) Exposed Structure Granulation Quality: Red, Pink Fascia Exposed: No Necrotic Amount: None Present (0%) Fat Layer (Subcutaneous Tissue) Exposed: Yes Tendon Exposed: No Muscle Exposed: No Joint Exposed: No Bone Exposed: No Treatment Notes Wound #7RR (Lower Leg) Wound Laterality: Left, Circumferential Cleanser Soap and Water Discharge Instruction: May shower and wash wound with dial antibacterial soap and water prior to dressing change. Wound Cleanser Discharge Instruction: Cleanse the wound with wound cleanser prior to applying a clean dressing using gauze sponges, not tissue or cotton balls. Peri-Wound Care Sween Lotion (Moisturizing lotion) Discharge Instruction: Apply moisturizing lotion as directed Topical Primary Dressing KerraCel Ag Gelling Fiber Dressing, 4x5 in (silver alginate) Discharge Instruction: Apply silver alginate to wound bed as instructed Secondary Dressing ABD Pad, 8x10 Discharge Instruction: Apply over primary dressing as directed. Woven Gauze Sponge, Non-Sterile 4x4  in Discharge Instruction: Apply over primary dressing as directed. Secured With Compression Wrap ThreePress (3 layer compression wrap) Discharge Instruction: Apply three layer compression as directed. Compression Stockings Add-Ons Electronic Signature(s) Signed: 07/08/2021 5:44:49 PM By: Deon Pilling RN, BSN Entered By: Deon Pilling on 07/08/2021 14:29:17 -------------------------------------------------------------------------------- Vitals Details Patient Name: Date of Service: [name], [place] A. 07/08/2021 1:45 PM Medical Record Number: [id number] Patient Account Number: [id number] Date of Birth/Sex: Treating RN: 1925-08-15 (88  y.o. [name], [name] Primary Care Corday Wyka: [name] Other Clinician: Referring Negin Hegg: Treating Rogerio Boutelle/Extender: [name] in Treatment: 56 Vital Signs Time Taken: 14:20 Temperature (F): 97.4 Height (in): 65 Pulse (bpm): 66 Weight (lbs): 163 Respiratory Rate (breaths/min): 20 Body Mass Index (BMI): 27.1 Blood Pressure (mmHg): 111/68 Reference Range: 80 - 120 mg / dl Electronic Signature(s) Signed: 07/08/2021 5:44:49 PM By: Deon Pilling RN, BSN Entered By: Deon Pilling on 07/08/2021 14:23:47

## 2021-07-10 NOTE — Telephone Encounter (Signed)
Daughter called to see if we will fax office notes to new dermatologist, Dr Darrin Nipper.  Notes sent from Dr Alen Blew and Wound center.

## 2021-07-11 ENCOUNTER — Inpatient Hospital Stay: Payer: Medicare Other

## 2021-07-11 ENCOUNTER — Inpatient Hospital Stay: Payer: Medicare Other | Attending: Oncology

## 2021-07-11 ENCOUNTER — Inpatient Hospital Stay (HOSPITAL_BASED_OUTPATIENT_CLINIC_OR_DEPARTMENT_OTHER): Payer: Medicare Other | Admitting: Oncology

## 2021-07-11 ENCOUNTER — Other Ambulatory Visit: Payer: Self-pay

## 2021-07-11 VITALS — BP 103/60 | HR 73 | Temp 97.8°F | Resp 16 | Ht 67.0 in | Wt 150.3 lb

## 2021-07-11 DIAGNOSIS — C44721 Squamous cell carcinoma of skin of unspecified lower limb, including hip: Secondary | ICD-10-CM

## 2021-07-11 DIAGNOSIS — E039 Hypothyroidism, unspecified: Secondary | ICD-10-CM

## 2021-07-11 DIAGNOSIS — Z5112 Encounter for antineoplastic immunotherapy: Secondary | ICD-10-CM | POA: Diagnosis not present

## 2021-07-11 DIAGNOSIS — C7652 Malignant neoplasm of left lower limb: Secondary | ICD-10-CM | POA: Diagnosis not present

## 2021-07-11 DIAGNOSIS — C4490 Unspecified malignant neoplasm of skin, unspecified: Secondary | ICD-10-CM

## 2021-07-11 DIAGNOSIS — Z79899 Other long term (current) drug therapy: Secondary | ICD-10-CM | POA: Diagnosis not present

## 2021-07-11 LAB — CBC WITH DIFFERENTIAL (CANCER CENTER ONLY)
Abs Immature Granulocytes: 0.01 10*3/uL (ref 0.00–0.07)
Basophils Absolute: 0 10*3/uL (ref 0.0–0.1)
Basophils Relative: 1 %
Eosinophils Absolute: 0.2 10*3/uL (ref 0.0–0.5)
Eosinophils Relative: 3 %
HCT: 33.8 % — ABNORMAL LOW (ref 36.0–46.0)
Hemoglobin: 10.9 g/dL — ABNORMAL LOW (ref 12.0–15.0)
Immature Granulocytes: 0 %
Lymphocytes Relative: 15 %
Lymphs Abs: 1 10*3/uL (ref 0.7–4.0)
MCH: 34.1 pg — ABNORMAL HIGH (ref 26.0–34.0)
MCHC: 32.2 g/dL (ref 30.0–36.0)
MCV: 105.6 fL — ABNORMAL HIGH (ref 80.0–100.0)
Monocytes Absolute: 0.9 10*3/uL (ref 0.1–1.0)
Monocytes Relative: 14 %
Neutro Abs: 4.3 10*3/uL (ref 1.7–7.7)
Neutrophils Relative %: 67 %
Platelet Count: 319 10*3/uL (ref 150–400)
RBC: 3.2 MIL/uL — ABNORMAL LOW (ref 3.87–5.11)
RDW: 13.4 % (ref 11.5–15.5)
WBC Count: 6.4 10*3/uL (ref 4.0–10.5)
nRBC: 0 % (ref 0.0–0.2)

## 2021-07-11 LAB — TSH: TSH: 0.998 u[IU]/mL (ref 0.350–4.500)

## 2021-07-11 LAB — CMP (CANCER CENTER ONLY)
ALT: 9 U/L (ref 0–44)
AST: 25 U/L (ref 15–41)
Albumin: 3.3 g/dL — ABNORMAL LOW (ref 3.5–5.0)
Alkaline Phosphatase: 53 U/L (ref 38–126)
Anion gap: 6 (ref 5–15)
BUN: 25 mg/dL — ABNORMAL HIGH (ref 8–23)
CO2: 30 mmol/L (ref 22–32)
Calcium: 9.5 mg/dL (ref 8.9–10.3)
Chloride: 98 mmol/L (ref 98–111)
Creatinine: 1.51 mg/dL — ABNORMAL HIGH (ref 0.44–1.00)
GFR, Estimated: 32 mL/min — ABNORMAL LOW (ref >60)
Glucose, Bld: 136 mg/dL — ABNORMAL HIGH (ref 70–99)
Potassium: 4.9 mmol/L (ref 3.5–5.1)
Sodium: 134 mmol/L — ABNORMAL LOW (ref 135–145)
Total Bilirubin: 0.8 mg/dL (ref 0.3–1.2)
Total Protein: 7.3 g/dL (ref 6.5–8.1)

## 2021-07-11 MED ORDER — SODIUM CHLORIDE 0.9 % IV SOLN
350.0000 mg | Freq: Once | INTRAVENOUS | Status: AC
  Administered 2021-07-11: 350 mg via INTRAVENOUS
  Filled 2021-07-11: qty 7

## 2021-07-11 MED ORDER — SODIUM CHLORIDE 0.9 % IV SOLN: Freq: Once | INTRAVENOUS | Status: AC

## 2021-07-11 NOTE — Progress Notes (Signed)
Ok to proceed with serum creatinine of 1.51 per Dr. Alen Blew.

## 2021-07-11 NOTE — Patient Instructions (Signed)
[facility]  Discharge Instructions: Thank you for choosing [facility] to provide your oncology and hematology care.   If you have a lab appointment with the Cancer Center, please go directly to the Cancer Center and check in at the registration area.   Wear comfortable clothing and clothing appropriate for easy access to any Portacath or PICC line.   We strive to give you quality time with your provider. You may need to reschedule your appointment if you arrive late (15 or more minutes).  Arriving late affects you and other patients whose appointments are after yours.  Also, if you miss three or more appointments without notifying the office, you may be dismissed from the clinic at the provider's discretion.      For prescription refill requests, have your pharmacy contact our office and allow 72 hours for refills to be completed.    Today you received the following chemotherapy and/or immunotherapy agents :  Libtayo      To help prevent nausea and vomiting after your treatment, we encourage you to take your nausea medication as directed.  BELOW ARE SYMPTOMS THAT SHOULD BE REPORTED IMMEDIATELY: *FEVER GREATER THAN 100.4 F (38 C) OR HIGHER *CHILLS OR SWEATING *NAUSEA AND VOMITING THAT IS NOT CONTROLLED WITH YOUR NAUSEA MEDICATION *UNUSUAL SHORTNESS OF BREATH *UNUSUAL BRUISING OR BLEEDING *URINARY PROBLEMS (pain or burning when urinating, or frequent urination) *BOWEL PROBLEMS (unusual diarrhea, constipation, pain near the anus) TENDERNESS IN MOUTH AND THROAT WITH OR WITHOUT PRESENCE OF ULCERS (sore throat, sores in mouth, or a toothache) UNUSUAL RASH, SWELLING OR PAIN  UNUSUAL VAGINAL DISCHARGE OR ITCHING   Items with * indicate a potential emergency and should be followed up as soon as possible or go to the Emergency Department if any problems should occur.  Please show the CHEMOTHERAPY ALERT CARD or IMMUNOTHERAPY ALERT CARD at check-in to  the Emergency Department and triage nurse.  Should you have questions after your visit or need to cancel or reschedule your appointment, please contact [facility]  Dept: [phone]  and follow the prompts.  Office hours are 8:00 a.m. to 4:30 p.m. Monday - Friday. Please note that voicemails left after 4:00 p.m. may not be returned until the following business day.  We are closed weekends and major holidays. You have access to a nurse at all times for urgent questions. Please call the main number to the clinic Dept: [phone] and follow the prompts.   For any non-urgent questions, you may also contact your provider using MyChart. We now offer e-Visits for anyone 18 and older to request care online for non-urgent symptoms. For details visit [email or web address].   Also download the MyChart app! Go to the app store, search "MyChart", open the app, select [facility], and log in with your MyChart username and password.  Due to Covid, a mask is required upon entering the hospital/clinic. If you do not have a mask, one will be given to you upon arrival. For doctor visits, patients may have 1 support person aged 18 or older with them. For treatment visits, patients cannot have anyone with them due to current Covid guidelines and our immunocompromised population.   

## 2021-07-16 ENCOUNTER — Ambulatory Visit (HOSPITAL_BASED_OUTPATIENT_CLINIC_OR_DEPARTMENT_OTHER): Payer: Medicare Other | Admitting: Internal Medicine

## 2021-07-17 ENCOUNTER — Telehealth: Payer: Self-pay | Admitting: *Deleted

## 2021-07-17 DIAGNOSIS — C44721 Squamous cell carcinoma of skin of unspecified lower limb, including hip: Secondary | ICD-10-CM | POA: Diagnosis not present

## 2021-07-17 DIAGNOSIS — L97909 Non-pressure chronic ulcer of unspecified part of unspecified lower leg with unspecified severity: Secondary | ICD-10-CM | POA: Diagnosis not present

## 2021-07-17 DIAGNOSIS — C44621 Squamous cell carcinoma of skin of unspecified upper limb, including shoulder: Secondary | ICD-10-CM | POA: Diagnosis not present

## 2021-07-17 NOTE — Telephone Encounter (Signed)
Contacted by [facility] w/[facility]. They are considering patient for intra lesion injections. They have received notes from [place] related to her infusions for squamous cell carcinoma treatment. The office would like to have patient's original pathology reports faxed to them. Faxed all pathology reports from 2013 to 2023 to office at number provided [phone]. Contacted office at number provided for Dr. Etheleen Mayhew [phone] and lvm that records faxed.

## 2021-07-18 ENCOUNTER — Encounter (HOSPITAL_BASED_OUTPATIENT_CLINIC_OR_DEPARTMENT_OTHER): Payer: Medicare Other | Admitting: Internal Medicine

## 2021-07-18 DIAGNOSIS — I890 Lymphedema, not elsewhere classified: Secondary | ICD-10-CM

## 2021-07-18 DIAGNOSIS — L97822 Non-pressure chronic ulcer of other part of left lower leg with fat layer exposed: Secondary | ICD-10-CM | POA: Diagnosis not present

## 2021-07-18 DIAGNOSIS — L97812 Non-pressure chronic ulcer of other part of right lower leg with fat layer exposed: Secondary | ICD-10-CM | POA: Diagnosis not present

## 2021-07-18 DIAGNOSIS — C4492 Squamous cell carcinoma of skin, unspecified: Secondary | ICD-10-CM | POA: Diagnosis not present

## 2021-07-18 DIAGNOSIS — I87313 Chronic venous hypertension (idiopathic) with ulcer of bilateral lower extremity: Secondary | ICD-10-CM | POA: Diagnosis not present

## 2021-07-18 DIAGNOSIS — M1990 Unspecified osteoarthritis, unspecified site: Secondary | ICD-10-CM | POA: Diagnosis not present

## 2021-07-18 NOTE — Progress Notes (Signed)
[name] ([id number]) , Visit Report for 07/18/2021 Chief Complaint Document Details Patient Name: Date of Service: [name], [name] 07/18/2021 11:15 A M Medical Record Number: [id number] Patient Account Number: [id number] Date of Birth/Sex: Treating RN: 07/08/1925 (88 y.o. [name] Primary Care Provider: [name] Other Clinician: Referring Provider: Treating Provider/Extender: [name] in Treatment: 26 Information Obtained from: Patient Chief Complaint Bilateral lower extremity wounds that have been biopsied and positive for squamous cell carcinoma Bilateral lower extremity wounds due to chronic venous insufficiency/lymphedema Electronic Signature(s) Signed: 07/18/2021 12:16:24 PM By: Kalman Shan DO Entered By: Kalman Shan on 07/18/2021 11:58:59 -------------------------------------------------------------------------------- HPI Details Patient Name: Date of Service: [name], [name] 07/18/2021 11:15 A M Medical Record Number: [id number] Patient Account Number: [id number] Date of Birth/Sex: Treating RN: 1925/03/04 (88 y.o. [name] Primary Care Provider: [name] Other Clinician: Referring Provider: Treating Provider/Extender: [name] in Treatment: 81 History of Present Illness Location: left leg HPI Description: Admission 5/23 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 She states that the wound on the right has been present for a year. The 1 on the left has opened up 1 month ago. She is followed with oncology for this issue as she had biopsies that showed squamous cell carcinoma. She is also seeing radiation oncology for  treatment options. She presents today because she would like for her wounds to be healed by [place]. She currently denies signs of infection. 6/1; patient presents for 1 week follow-up. She states she has tolerated the leg wraps well. She states these do not bother her and is happy to continue with them. She is scheduled to see her oncologist today to go over treatment options for the bilateral lower extremity squamous cell carcinoma. Radiation is currently not a recommended option. Patient states she overall feels well. 6/22; patient presents for 3-week follow-up. She has tolerated the wraps well until her last wrap where she states they were uncomfortable. She attributes this to the home health nurse. She denies signs of infection. She has started her first treatment of antibody infusions for her Bilateral lower extremity squamous cell carcinoma. She has no complaints today. 7/21; patient presents for 1 month follow-up. Unfortunately she has not had good experience with her wrap changes with home health. She would like to do her own dressing changes. She continues to do her antibody infusions. She denies signs of infection. 7/28; patient presents for 1 week follow-up. At last clinic visit she was switched to daily dressing changes due to issues with the wrap and home health placing them. Unfortunately she has developed weeping to her legs bilaterally. She would like to be placed in wraps today. She would also like to follow with [place] weekly for wrap changes instead of having home health change them. She denies signs of infection. 8/4; patient presents for 1 week follow-up. She has tolerated the Kerlix/Coban wraps well. She no longer has weeping to her legs. She took the wrap off 1 day before coming in to be able to take a shower. She has no issues or complaints today. She denies signs of infection. 8/18; patient presents for follow-up. Patient has tolerated the wraps well. She brought her Velcro  compression wraps today. She  has no issues or complaints today. She had her chemotherapy infusion yesterday without issues. She denies signs of infection. 8/25; patient presents for follow-up. She used her juxta lite compressions for the past week. It is unclear if she is able to put these on correctly since she states she has a hard time getting them to look right. She reports 2 open wounds. She currently denies signs of infection. 9/1; patient presents for 1 week follow-up. She has 1 open wound. She tolerated the compression wraps well. She currently denies signs of infection. 9/8; patient presents for 1 week follow-up. She has 2 open wounds 1 on each leg. She has tolerated the compression wrap well. She currently denies signs of infection. 9/15; patient presents for 1 week follow-up. She now has 3 wounds. 2 on the left and 1 on the right. She continues to tolerate the compression wrap well. She currently denies signs of infection. She has obtained furosemide by her primary care physician and would like to discuss when to take this. 9/22; patient presents for 1 week follow-up. She has scattered wounds on her lower extremities bilaterally. She did take furosemide twice in the past week. She does not recall having to urinate more frequently. She tolerated the 3 layer compression well. She denies signs of infection. 10/13; patient has not been here recently because of [place]. Apparently the facility was only putting gauze on her legs. This is a patient I do not normally see. 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 Ronnald Ramp at [facility] dermatology. She is quite convincing that she did not have radiation to her lower legs. It is likely she also has significant chronic venous insufficiency stasis dermatitis. We have been using silver alginate under kerlix Coban. She has obvious open areas medially on the right and areas on the  left. She also has areas of extensive dry flaking adherent areas on the right and to a lesser extent on the left anterior. Nodular areas on the left lateral lower leg left posterior calf and right mid calf medially. 10/21; patient presents for follow-up. She has no issues or complaints today. She has tolerated the 3 layer compression well bilaterally. She denies signs of infection. 10/27; patient presents for follow-up. She continues to tolerate the 3 layer compression wrap well. She denies signs of infection. 11/30; patient presents for follow-up. She has no issues or complaints today. 11/10; patient arrived in clinic today for nurse visit accompanied by her daughter from [place]. The daughter had multiple questions so we turned this into doctors visit. Apparently after the last time I saw this woman in October she went to see Dr. Ronnald Ramp but he did not take the wraps off. She previously was treated with Cemiplimab for her squamous cell carcinoma. She did not receive radiation to her legs. I had wanted Dr. Ronnald Ramp to look at this because of the exceptionally damaged skin on her lower legs bilaterally. She has odd looking wounds on the left medial lower leg also extending posteriorly which we have not made a lot of progress on. But she also has a raised hyperkeratotic nodules on the right anterior tibial area plaques on the left medial lower thigh. These are not areas that are under compression. We have been using silver alginate on any open areas Liberal TCA under 3 layer compression. 11/17; patient presents for follow-up. She had her wraps taken off yesterday for her dermatology appointment. She  reports excessive weeping to her legs bilaterally after the wraps were taken off. She currently denies signs of infection. 12/12/2020 upon evaluation today patient appears to be doing about the same in regard to her wounds. She was actually started on Cipro after having biopsies apparently at her dermatology  clinic she does not have the results back from the actual biopsy but the culture did return and apparently they placed her on the Cipro she started that just this morning. Other than that her legs appear to be doing okay at this time. 12/1; patient presents for follow-up. She has no issues or complaints today. She has started Lasix 40 mg daily to help with her bilateral leg swelling. 12/8; patient presents for follow-up. She has no issues or complaints today. 12/15; patient presents for 1 week follow-up. She has no issues or complaints today. 12/22; patient presents for follow-up. She has no issues or complaints today. 1/5; patient presents for follow-up. She has no issues or complaints today. She has tolerated the compression wraps well. 1/12; patient presents for follow-up. She is tolerated the compression wrap well and has no issues or complaints today. She denies signs of infection. 1/19; patient presents for follow-up. She took the compression wraps off 1 to 2 days ago to take a shower and did not have compression wraps replaced. She reports increased blistering throughout her legs bilaterally. She currently denies signs of infection. 1/26; patient presents for follow-up. She has no issues or complaints today. She tolerated the compression wraps well. She has not heard from oncology to schedule an appointment. 2/2; patient presents for follow-up. She has no issues or complaints today. She continues to tolerate the compression wrap well. 2/16; patient presents for follow-up. She has no issues or complaints today. 2/23; patient presents for follow-up. She has no issues or complaints today. Tomorrow she has an appointment with oncology to look at the suspicious lesion on her right lower extremity. She currently denies signs of infection. 3/2; patient presents for follow-up. She has been using her juxta light compression to the right lower extremity however there has been increased swelling  and opening of wounds throughout the legs with weeping. She currently denies signs of infection. She had no issues with the compression wrap to the left lower extremity. 3/16; patient presents for follow-up. She has no issues or complaints today. She states that she has been restarted on infusions for her squamous cell carcinoma of her legs. She has also been increased on Lasix to 40 mg twice daily. She has noticed a decrease in swelling to her legs. 3/23; patient presents for follow-up. She starts her second infusion next week for her squamous cell carcinoma lesions to her legs. She reports no issues with the compression wraps. 3/30; patient presents for follow-up. She has no issues or complaints today. 4/6; patient presents for follow-up. Her left lower extremity wounds have healed. She has her juxta light compression with her today. She has no issues or complaints today. 4/13; this is a patient we actually discharged to juxta lite stockings on the left leg the last time she was here. She came in on Monday for [place] to look at the leg and a nurse visit. She had massive swelling and numerous blisters and wound reopening. We put her back in 3 layer compression although I did not generate a note. Silver alginate on the wounded areas. We have also been doing the same on the right. In the meantime she is obtained her external compression pumps  that were previously ordered and she started to use them. She has skin cancers that are obvious on the right leg x2 her appointment with Dr. Jarome Matin of dermatology is on May 5.. She is also receiving weekly chemotherapy for recurrent presumably squamous cell carcinomas apparently has had 2 treatments 4/20; patient presents for follow-up. She has her new juxta lite compressions today. She continues to have open wounds to the left lower extremity. She has follow-up with dermatology in 2 weeks. She continues to have IV infusions for her squamous cell carcinoma  lesions on her legs. 4/28; the patient has bilateral lower extremity wounds predominantly on the right lateral upper leg, right anterior lower leg which in itself is probably a recurrent cancer as well as the left lateral lower leg. She has recurrent squamous cell carcinoma currently being treated with an IV infusion. She also has scattered nodules on her upper lower legs and thighs I am not certain of all of this is felt to be malignant as well We have been using silver alginate on the open areas wrapping her legs. She also has her external compression pumps at home but I am not clear that she is going to use these 5/2; patient presents for follow-up. She has not been taking her diuretics as prescribed. She sees Dr. Dian Situ, dermatology at the end of the week. She tolerated the compression wraps well today. She has her juxta lite compressions. 5/8; patient presents for follow-up. She saw Dr. Dian Situ, dermatology and she has nothing to report on. She has been using her juxta lite compression to the right lower extremity. She has tolerated the compression wrap well in the left lower extremity. She has not been taking her diuretic. She is scheduled to continue her infusions for her squamous cell carcinoma on her legs. 5/18; patient presents for follow-up. We wrapped her in 3 layer compression at last clinic visit. She has no issues or complaints today. 5/22; patient presents for follow-up. She has been wrapped in 3 layer compression bilaterally to her lower extremities over the past week. She has been scratching at the top of the wrap and picking at her skin. This area has increased warmth and erythema. 5/30; patient presents for follow-up. She completed Keflex with resolution of her symptoms to the right lower extremity. She no longer has increased warmth or erythema. She continues her infusions for her squamous cell carcinoma lesions. We continue to wrap her in 3 layer compression with silver alginate.  She currently denies signs of infection. 6/8; the patient went to see Dr. Ronnald Ramp of dermatology 2 days ago unfortunately we do not have his notes. They removed her compression wraps of course but did not adequately replace them. She comes in today with 3 wounds on the right lateral upper calf 1 medially below the obvious squamous cell carcinoma there is a large necrotic wound on the left lateral tibial area on the left and several blisters. The patient remains on Cemiplimad infusions for the squamous cell carcinoma bilaterally. She is not felt to be a good candidate for radiation. Some of the skin changes superiorly in the upper legs bilaterally according to Dr. Ronnald Ramp related to these infusions the same like fleshy nodules to me not blisters 6/13; patient presents for follow-up. We have been using silver alginate under 3 layer compression bilaterally. She has no issues or complaints today. 6/19; patient presents for follow-up. She will see her oncologist in 3 days and next week her dermatologist. She has no new  complaints today. We are using silver alginate under 3 layer compression bilaterally. 6/29; patient presents for follow-up. She she saw Dr. Darrin Nipper last week and States she received injections to the lesions on her legs. We will ask for office visit records. She could not be wrapped with compression and has more drainage on exam today. Electronic Signature(s) Signed: 07/18/2021 12:16:24 PM By: Kalman Shan DO Entered By: Kalman Shan on 07/18/2021 12:03:09 -------------------------------------------------------------------------------- Physical Exam Details Patient Name: Date of Service: [name], [name] 07/18/2021 11:15 A M Medical Record Number: [id number] Patient Account Number: [id number] Date of Birth/Sex: Treating RN: Jan 18, 1926 (88 y.o. [name] Primary Care Provider: [name] Other Clinician: Referring Provider: Treating Provider/Extender: [name] in Treatment: 95 Constitutional respirations regular, non-labored and within target range for patient.[name] Psychiatric pleasant and cooperative. Notes Multiple small scattered open wounds to her lower extremities bilaterally limited to skin breakdown. 3 areas suspicious for malignancy. Weeping noted with maceration to the [employer]) Signed: 07/18/2021 12:16:24 PM By: Kalman Shan DO Entered By: Kalman Shan on 07/18/2021 12:03:51 -------------------------------------------------------------------------------- Physician Orders Details Patient Name: Date of Service: [name], [name] 07/18/2021 11:15 A M Medical Record Number: [id number] Patient Account Number: [id number] Date of Birth/Sex: Treating RN: Mar 04, 1925 (88 y.o. [name] Primary Care Provider: [name] Other Clinician: Referring Provider: Treating Provider/Extender: [name] in Treatment: 11 Verbal / Phone Orders: No Diagnosis Coding Follow-up Appointments ppointment in 1 week. - Dr. Heber [place] [facility] [place], Room 8 07/25/2021 Thursday 130pm Return A ppointment in 2 weeks. - Dr. Heber [place] and [place], Room 8 08/01/2021 Thursday 130pm Return A Return appointment in 3 weeks. - Dr. Heber  [facility] [place], Room 8 08/08/2021 Thursday 130pm Other: - Dermatology specialists keep that appt time on weekly. Bathing/ Shower/ Hygiene May shower with protection but do not get wound dressing(s) wet. - Use cast protector Edema Control - Lymphedema / SCD / Other Lymphedema Pumps. Use Lymphedema pumps on leg(s) 2-3 times a day for 45-60 minutes. If wearing any wraps or hose, do not remove them. Continue exercising as instructed. - use one hour each time twice a day. Elevate legs to the level of the heart or above for 30 minutes daily and/or when sitting, a frequency of: - elevate the legs throughout the day heart level if  possible. Avoid standing for long periods of time. Exercise regularly Additional Orders / Instructions Follow Nutritious Diet Wound Treatment Wound #20 - Lower Leg Wound Laterality: Right, Circumferential Cleanser: Soap and Water 1 x Per Week/30 Days Discharge Instructions: May shower and wash wound with dial antibacterial soap and water prior to dressing change. Cleanser: Wound Cleanser 1 x Per Week/30 Days Discharge Instructions: Cleanse the wound with wound cleanser prior to applying a clean dressing using gauze sponges, not tissue or cotton balls. Peri-Wound Care: Sween Lotion (Moisturizing lotion) 1 x Per Week/30 Days Discharge Instructions: Apply moisturizing lotion as directed Prim Dressing: KerraCel Ag Gelling Fiber Dressing, 4x5 in (silver alginate) 1 x Per Week/30 Days ary Discharge Instructions: Apply silver alginate to wound bed as instructed Secondary Dressing: ABD Pad, 8x10 1 x Per Week/30 Days Discharge Instructions: Apply over primary dressing as directed. Secondary Dressing: Woven Gauze Sponge, Non-Sterile 4x4 in 1 x Per Week/30 Days Discharge Instructions: Apply over primary dressing as directed. Compression Wrap: ThreePress (3 layer compression wrap) 1 x Per Week/30 Days Discharge Instructions: Apply three layer compression as directed. Wound #7RR - Lower Leg Wound  Laterality: Left, Circumferential Cleanser: Soap and Water 1 x Per Week/30 Days Discharge Instructions: May shower and wash wound with dial antibacterial soap and water prior to dressing change. Cleanser: Wound Cleanser 1 x Per Week/30 Days Discharge Instructions: Cleanse the wound with wound cleanser prior to applying a clean dressing using gauze sponges, not tissue or cotton balls. Peri-Wound Care: Sween Lotion (Moisturizing lotion) 1 x Per Week/30 Days Discharge Instructions: Apply moisturizing lotion as directed Prim Dressing: KerraCel Ag Gelling Fiber Dressing, 4x5 in (silver alginate) 1 x Per Week/30  Days ary Discharge Instructions: Apply silver alginate to wound bed as instructed Secondary Dressing: ABD Pad, 8x10 1 x Per Week/30 Days Discharge Instructions: Apply over primary dressing as directed. Secondary Dressing: Woven Gauze Sponge, Non-Sterile 4x4 in 1 x Per Week/30 Days Discharge Instructions: Apply over primary dressing as directed. Compression Wrap: ThreePress (3 layer compression wrap) 1 x Per Week/30 Days Discharge Instructions: Apply three layer compression as directed. Electronic Signature(s) Signed: 07/18/2021 12:16:24 PM By: Kalman Shan DO Entered By: Kalman Shan on 07/18/2021 12:04:00 -------------------------------------------------------------------------------- Problem List Details Patient Name: Date of Service: [name], [name] 07/18/2021 11:15 A M Medical Record Number: [id number] Patient Account Number: [id number] Date of Birth/Sex: Treating RN: 1925-10-16 (88 y.o. [name], [name] Primary Care Provider: [name] Other Clinician: Referring Provider: Treating Provider/Extender: [name] in Treatment: 64 Active Problems ICD-10 Encounter Code Description Active Date MDM Diagnosis C44.92 Squamous cell carcinoma of skin, unspecified 06/11/2020 No Yes L97.812 Non-pressure chronic ulcer of other part of right lower leg with fat layer 06/11/2020 No Yes exposed L97.822 Non-pressure chronic ulcer of other part of left lower leg with fat layer exposed5/23/2022 No Yes I89.0 Lymphedema, not elsewhere classified 01/31/2021 No Yes I87.313 Chronic venous hypertension (idiopathic) with ulcer of bilateral lower extremity 03/21/2021 No Yes Inactive Problems Resolved Problems Electronic Signature(s) Signed: 07/18/2021 12:16:24 PM By: Kalman Shan DO Entered By: Kalman Shan on 07/18/2021 11:58:25 -------------------------------------------------------------------------------- Progress Note Details Patient  Name: Date of Service: [name], [name] 07/18/2021 11:15 A M Medical Record Number: [id number] Patient Account Number: [id number] Date of Birth/Sex: Treating RN: October 17, 1925 (88 y.o. [name], [name] Primary Care Provider: [name] Other Clinician: Referring Provider: Treating Provider/Extender: [name] in Treatment: 20 Subjective Chief Complaint Information obtained from Patient Bilateral lower extremity wounds that have been biopsied and positive for squamous cell carcinoma Bilateral lower extremity wounds due to chronic venous insufficiency/lymphedema History of Present Illness (HPI) The following HPI elements were documented for the patient's wound: Location: left leg Admission 5/23 Ms. 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 She states that the wound on the right has been present for a year. The 1 on the left has opened up 1 month ago. She is followed with oncology for this issue as she had biopsies that showed squamous cell carcinoma. She is also seeing radiation oncology for treatment options. She presents today because she would like for her wounds to be healed by [place]. She currently denies signs of infection. 6/1; patient presents for 1 week follow-up. She states she has tolerated the leg wraps well. She states these do not bother her and is happy to continue with them. She is scheduled to see her oncologist today to go over  treatment options for the bilateral lower extremity squamous cell carcinoma. Radiation is currently not a recommended option. Patient states she overall feels well. 6/22; patient presents for 3-week follow-up. She has tolerated the wraps well until her last wrap where she states they  were uncomfortable. She attributes this to the home health nurse. She denies signs of infection. She has started her first treatment of antibody infusions for her Bilateral lower extremity squamous cell carcinoma. She has no complaints today. 7/21; patient presents for 1 month follow-up. Unfortunately she has not had good experience with her wrap changes with home health. She would like to do her own dressing changes. She continues to do her antibody infusions. She denies signs of infection. 7/28; patient presents for 1 week follow-up. At last clinic visit she was switched to daily dressing changes due to issues with the wrap and home health placing them. Unfortunately she has developed weeping to her legs bilaterally. She would like to be placed in wraps today. She would also like to follow with [place] weekly for wrap changes instead of having home health change them. She denies signs of infection. 8/4; patient presents for 1 week follow-up. She has tolerated the Kerlix/Coban wraps well. She no longer has weeping to her legs. She took the wrap off 1 day before coming in to be able to take a shower. She has no issues or complaints today. She denies signs of infection. 8/18; patient presents for follow-up. Patient has tolerated the wraps well. She brought her Velcro compression wraps today. She has no issues or complaints today. She had her chemotherapy infusion yesterday without issues. She denies signs of infection. 8/25; patient presents for follow-up. She used her juxta lite compressions for the past week. It is unclear if she is able to put these on correctly since she states she has a hard time getting them to look right. She reports 2 open wounds. She currently denies signs of infection. 9/1; patient presents for 1 week follow-up. She has 1 open wound. She tolerated the compression wraps well. She currently denies signs of infection. 9/8; patient presents for 1 week follow-up. She has 2 open wounds  1 on each leg. She has tolerated the compression wrap well. She currently denies signs of infection. 9/15; patient presents for 1 week follow-up. She now has 3 wounds. 2 on the left and 1 on the right. She continues to tolerate the compression wrap well. She currently denies signs of infection. She has obtained furosemide by her primary care physician and would like to discuss when to take this. 9/22; patient presents for 1 week follow-up. She has scattered wounds on her lower extremities bilaterally. She did take furosemide twice in the past week. She does not recall having to urinate more frequently. She tolerated the 3 layer compression well. She denies signs of infection. 10/13; patient has not been here recently because of [place]. Apparently the facility was only putting gauze on her legs. This is a patient I do not normally see. 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 Ronnald Ramp at [facility] dermatology. She is quite convincing that she did not have radiation to her lower legs. It is likely she also has significant chronic venous insufficiency stasis dermatitis. We have been using silver alginate under kerlix Coban. She has obvious open areas medially on the right and areas on the left. She also has areas of extensive dry flaking adherent  areas on the right and to a lesser extent on the left anterior. Nodular areas on the left lateral lower leg left posterior calf and right mid calf medially. 10/21; patient presents for follow-up. She has no issues or complaints today. She has tolerated the 3 layer compression well bilaterally. She denies signs of infection. 10/27; patient presents for follow-up. She continues to tolerate the 3 layer compression wrap well. She denies signs of infection. 11/30; patient presents for follow-up. She has no issues or complaints today. 11/10; patient arrived in clinic today for nurse visit  accompanied by her daughter from [place]. The daughter had multiple questions so we turned this into doctors visit. Apparently after the last time I saw this woman in October she went to see Dr. Ronnald Ramp but he did not take the wraps off. She previously was treated with Cemiplimab for her squamous cell carcinoma. She did not receive radiation to her legs. I had wanted Dr. Ronnald Ramp to look at this because of the exceptionally damaged skin on her lower legs bilaterally. She has odd looking wounds on the left medial lower leg also extending posteriorly which we have not made a lot of progress on. But she also has a raised hyperkeratotic nodules on the right anterior tibial area plaques on the left medial lower thigh. These are not areas that are under compression. We have been using silver alginate on any open areas Liberal TCA under 3 layer compression. 11/17; patient presents for follow-up. She had her wraps taken off yesterday for her dermatology appointment. She reports excessive weeping to her legs bilaterally after the wraps were taken off. She currently denies signs of infection. 12/12/2020 upon evaluation today patient appears to be doing about the same in regard to her wounds. She was actually started on Cipro after having biopsies apparently at her dermatology clinic she does not have the results back from the actual biopsy but the culture did return and apparently they placed her on the Cipro she started that just this morning. Other than that her legs appear to be doing okay at this time. 12/1; patient presents for follow-up. She has no issues or complaints today. She has started Lasix 40 mg daily to help with her bilateral leg swelling. 12/8; patient presents for follow-up. She has no issues or complaints today. 12/15; patient presents for 1 week follow-up. She has no issues or complaints today. 12/22; patient presents for follow-up. She has no issues or complaints today. 1/5; patient presents for  follow-up. She has no issues or complaints today. She has tolerated the compression wraps well. 1/12; patient presents for follow-up. She is tolerated the compression wrap well and has no issues or complaints today. She denies signs of infection. 1/19; patient presents for follow-up. She took the compression wraps off 1 to 2 days ago to take a shower and did not have compression wraps replaced. She reports increased blistering throughout her legs bilaterally. She currently denies signs of infection. 1/26; patient presents for follow-up. She has no issues or complaints today. She tolerated the compression wraps well. She has not heard from oncology to schedule an appointment. 2/2; patient presents for follow-up. She has no issues or complaints today. She continues to tolerate the compression wrap well. 2/16; patient presents for follow-up. She has no issues or complaints today. 2/23; patient presents for follow-up. She has no issues or complaints today. Tomorrow she has an appointment with oncology to look at the suspicious lesion on her right lower extremity. She currently denies  signs of infection. 3/2; patient presents for follow-up. She has been using her juxta light compression to the right lower extremity however there has been increased swelling and opening of wounds throughout the legs with weeping. She currently denies signs of infection. She had no issues with the compression wrap to the left lower extremity. 3/16; patient presents for follow-up. She has no issues or complaints today. She states that she has been restarted on infusions for her squamous cell carcinoma of her legs. She has also been increased on Lasix to 40 mg twice daily. She has noticed a decrease in swelling to her legs. 3/23; patient presents for follow-up. She starts her second infusion next week for her squamous cell carcinoma lesions to her legs. She reports no issues with the compression wraps. 3/30; patient presents  for follow-up. She has no issues or complaints today. 4/6; patient presents for follow-up. Her left lower extremity wounds have healed. She has her juxta light compression with her today. She has no issues or complaints today. 4/13; this is a patient we actually discharged to juxta lite stockings on the left leg the last time she was here. She came in on Monday for [place] to look at the leg and a nurse visit. She had massive swelling and numerous blisters and wound reopening. We put her back in 3 layer compression although I did not generate a note. Silver alginate on the wounded areas. We have also been doing the same on the right. In the meantime she is obtained her external compression pumps that were previously ordered and she started to use them. She has skin cancers that are obvious on the right leg x2 her appointment with Dr. Jarome Matin of dermatology is on May 5.. She is also receiving weekly chemotherapy for recurrent presumably squamous cell carcinomas apparently has had 2 treatments 4/20; patient presents for follow-up. She has her new juxta lite compressions today. She continues to have open wounds to the left lower extremity. She has follow-up with dermatology in 2 weeks. She continues to have IV infusions for her squamous cell carcinoma lesions on her legs. 4/28; the patient has bilateral lower extremity wounds predominantly on the right lateral upper leg, right anterior lower leg which in itself is probably a recurrent cancer as well as the left lateral lower leg. She has recurrent squamous cell carcinoma currently being treated with an IV infusion. She also has scattered nodules on her upper lower legs and thighs I am not certain of all of this is felt to be malignant as well We have been using silver alginate on the open areas wrapping her legs. She also has her external compression pumps at home but I am not clear that she is going to use these 5/2; patient presents for follow-up. She  has not been taking her diuretics as prescribed. She sees Dr. Dian Situ, dermatology at the end of the week. She tolerated the compression wraps well today. She has her juxta lite compressions. 5/8; patient presents for follow-up. She saw Dr. Dian Situ, dermatology and she has nothing to report on. She has been using her juxta lite compression to the right lower extremity. She has tolerated the compression wrap well in the left lower extremity. She has not been taking her diuretic. She is scheduled to continue her infusions for her squamous cell carcinoma on her legs. 5/18; patient presents for follow-up. We wrapped her in 3 layer compression at last clinic visit. She has no issues or complaints today. 5/22; patient  presents for follow-up. She has been wrapped in 3 layer compression bilaterally to her lower extremities over the past week. She has been scratching at the top of the wrap and picking at her skin. This area has increased warmth and erythema. 5/30; patient presents for follow-up. She completed Keflex with resolution of her symptoms to the right lower extremity. She no longer has increased warmth or erythema. She continues her infusions for her squamous cell carcinoma lesions. We continue to wrap her in 3 layer compression with silver alginate. She currently denies signs of infection. 6/8; the patient went to see Dr. Ronnald Ramp of dermatology 2 days ago unfortunately we do not have his notes. They removed her compression wraps of course but did not adequately replace them. She comes in today with 3 wounds on the right lateral upper calf 1 medially below the obvious squamous cell carcinoma there is a large necrotic wound on the left lateral tibial area on the left and several blisters. The patient remains on Cemiplimad infusions for the squamous cell carcinoma bilaterally. She is not felt to be a good candidate for radiation. Some of the skin changes superiorly in the upper legs bilaterally according to Dr.  Ronnald Ramp related to these infusions the same like fleshy nodules to me not blisters 6/13; patient presents for follow-up. We have been using silver alginate under 3 layer compression bilaterally. She has no issues or complaints today. 6/19; patient presents for follow-up. She will see her oncologist in 3 days and next week her dermatologist. She has no new complaints today. We are using silver alginate under 3 layer compression bilaterally. 6/29; patient presents for follow-up. She she saw Dr. Darrin Nipper last week and States she received injections to the lesions on her legs. We will ask for office visit records. She could not be wrapped with compression and has more drainage on exam today. Patient History Information obtained from Patient. Family History Unknown History. Social History Never smoker, Marital Status - Single, Alcohol Use - Never, Drug Use - No History, Caffeine Use - Never. Medical History Eyes Denies history of Cataracts, Optic Neuritis Ear/Nose/Mouth/Throat Denies history of Chronic sinus problems/congestion, Middle ear problems Hematologic/Lymphatic Patient has history of Anemia Denies history of Hemophilia, Human Immunodeficiency Virus, Lymphedema, Sickle Cell Disease Respiratory Denies history of Aspiration, Asthma, Chronic Obstructive Pulmonary Disease (COPD), Pneumothorax, Sleep Apnea, Tuberculosis Cardiovascular Patient has history of Arrhythmia - Atrial Flutter, A fibb, Congestive Heart Failure, Hypertension Denies history of Angina, Coronary Artery Disease, Deep Vein Thrombosis, Hypotension, Myocardial Infarction, Peripheral Arterial Disease, Peripheral Venous Disease, Phlebitis, Vasculitis Gastrointestinal Patient has history of Colitis Denies history of Cirrhosis , Crohnoos, Hepatitis A, Hepatitis B, Hepatitis C Endocrine Denies history of Type I Diabetes, Type II Diabetes Genitourinary Denies history of End Stage Renal Disease Immunological Denies history of  Lupus Erythematosus, Raynaudoos, Scleroderma Integumentary (Skin) Denies history of History of Burn Musculoskeletal Patient has history of Osteoarthritis Denies history of Gout, Rheumatoid Arthritis, Osteomyelitis Neurologic Denies history of Dementia, Neuropathy, Quadriplegia, Paraplegia, Seizure Disorder Oncologic Denies history of Received Chemotherapy, Received Radiation Hospitalization/Surgery History - removal of rod left leg. 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 Vitals Time Taken: 11:35 AM, Height: 65 in, Weight: 163 lbs, BMI: 27.1, Temperature: 98.2 F, Pulse: 97 bpm, Respiratory Rate: 20 breaths/min, Blood Pressure: 109/68 mmHg. Psychiatric pleasant and cooperative. General Notes: Multiple small scattered open wounds to her  lower extremities bilaterally limited to skin breakdown. 3 areas suspicious for malignancy. Weeping noted with maceration to the periwound's Integumentary (Hair, Skin) Wound #20 status is Open. Original cause of wound was Gradually Appeared. The date acquired was: 06/25/2021. The wound has been in treatment 3 weeks. The wound is located on the Right,Circumferential Lower Leg. The wound measures 34cm length x 31cm width x 0.1cm depth; 827.81cm^2 area and 82.781cm^3 volume. There is Fat Layer (Subcutaneous Tissue) exposed. There is no tunneling or undermining noted. There is a large amount of serosanguineous drainage noted. The wound margin is distinct with the outline attached to the wound base. There is large (67-100%) pink granulation within the wound bed. General Notes: Maceration present Wound #7RR status is Open. Original cause of wound was Blister. The date acquired was: 04/20/2020. The wound has been in treatment 57 weeks. The  wound is located on the Left,Circumferential Lower Leg. The wound measures 9.5cm length x 4.5cm width x 0.1cm depth; 33.576cm^2 area and 3.358cm^3 volume. There is Fat Layer (Subcutaneous Tissue) exposed. There is no tunneling or undermining noted. There is a large amount of serosanguineous drainage noted. The wound margin is distinct with the outline attached to the wound base. There is large (67-100%) red, pink granulation within the wound bed. There is no necrotic tissue within the wound bed. General Notes: Maceration present Assessment Active Problems ICD-10 Squamous cell carcinoma of skin, unspecified Non-pressure chronic ulcer of other part of right lower leg with fat layer exposed Non-pressure chronic ulcer of other part of left lower leg with fat layer exposed Lymphedema, not elsewhere classified Chronic venous hypertension (idiopathic) with ulcer of bilateral lower extremity Patient's wounds are stable. She reports seeing Dr. Volanda Napoleon last week and receiving injections to the lesions on her legs. We will request office notes. She has more drainage on exam because she did not have compression therapy. I recommended alginate with Kerlix/Coban. Follow-up in 1 week. Procedures Wound #20 Pre-procedure diagnosis of Wound #20 is a Lymphedema located on the Right,Circumferential Lower Leg . There was a Three Layer Compression Therapy Procedure by [name] Pilling, RN. Post procedure Diagnosis Wound #20: Same as Pre-Procedure Wound #7RR Pre-procedure diagnosis of Wound #7RR is a Malignant Wound located on the Left,Circumferential Lower Leg . There was a Three Layer Compression Therapy Procedure by [name] Pilling, RN. Post procedure Diagnosis Wound #7RR: Same as Pre-Procedure Plan Follow-up Appointments: Return Appointment in 1 week. - Dr. Heber [place] and [place], Room 8 07/25/2021 Thursday 130pm Return Appointment in 2 weeks. - Dr. Heber [place] and [place], Room 8 08/01/2021 Thursday 130pm Return appointment  in 3 weeks. - Dr. Heber  and [place], Room 8 08/08/2021 Thursday 130pm Other: - Dermatology specialists keep that appt time on weekly. Bathing/ Shower/ Hygiene: May shower with protection but do not get wound dressing(s) wet. - Use cast protector Edema Control - Lymphedema / SCD / Other: Lymphedema Pumps. Use Lymphedema pumps on leg(s) 2-3 times a day for 45-60 minutes. If wearing any wraps or hose, do not remove them. Continue exercising as instructed. - use one hour each time twice a day. Elevate legs to the level of the heart or above for 30 minutes daily and/or when sitting, a frequency of: - elevate the legs throughout the day heart level if possible. Avoid standing for long periods of time. Exercise regularly Additional Orders / Instructions: Follow Nutritious Diet WOUND #20: - Lower Leg Wound Laterality: Right, Circumferential Cleanser: Soap and Water 1 x Per Week/30 Days Discharge Instructions: May shower and wash  wound with dial antibacterial soap and water prior to dressing change. Cleanser: Wound Cleanser 1 x Per Week/30 Days Discharge Instructions: Cleanse the wound with wound cleanser prior to applying a clean dressing using gauze sponges, not tissue or cotton balls. Peri-Wound Care: Sween Lotion (Moisturizing lotion) 1 x Per Week/30 Days Discharge Instructions: Apply moisturizing lotion as directed Prim Dressing: KerraCel Ag Gelling Fiber Dressing, 4x5 in (silver alginate) 1 x Per Week/30 Days ary Discharge Instructions: Apply silver alginate to wound bed as instructed Secondary Dressing: ABD Pad, 8x10 1 x Per Week/30 Days Discharge Instructions: Apply over primary dressing as directed. Secondary Dressing: Woven Gauze Sponge, Non-Sterile 4x4 in 1 x Per Week/30 Days Discharge Instructions: Apply over primary dressing as directed. Com pression Wrap: ThreePress (3 layer compression wrap) 1 x Per Week/30 Days Discharge Instructions: Apply three layer compression as  directed. WOUND #7RR: - Lower Leg Wound Laterality: Left, Circumferential Cleanser: Soap and Water 1 x Per Week/30 Days Discharge Instructions: May shower and wash wound with dial antibacterial soap and water prior to dressing change. Cleanser: Wound Cleanser 1 x Per Week/30 Days Discharge Instructions: Cleanse the wound with wound cleanser prior to applying a clean dressing using gauze sponges, not tissue or cotton balls. Peri-Wound Care: Sween Lotion (Moisturizing lotion) 1 x Per Week/30 Days Discharge Instructions: Apply moisturizing lotion as directed Prim Dressing: KerraCel Ag Gelling Fiber Dressing, 4x5 in (silver alginate) 1 x Per Week/30 Days ary Discharge Instructions: Apply silver alginate to wound bed as instructed Secondary Dressing: ABD Pad, 8x10 1 x Per Week/30 Days Discharge Instructions: Apply over primary dressing as directed. Secondary Dressing: Woven Gauze Sponge, Non-Sterile 4x4 in 1 x Per Week/30 Days Discharge Instructions: Apply over primary dressing as directed. Com pression Wrap: ThreePress (3 layer compression wrap) 1 x Per Week/30 Days Discharge Instructions: Apply three layer compression as directed. 1. Silver alginate with Kerlix/Coban 2. Follow-up in 1 week Electronic Signature(s) Signed: 07/18/2021 12:16:24 PM By: Kalman Shan DO Entered By: Kalman Shan on 07/18/2021 12:05:35 -------------------------------------------------------------------------------- HxROS Details Patient Name: Date of Service: [name], Zoelle A. 07/18/2021 11:15 A M Medical Record Number: [id number] Patient Account Number: [id number] Date of Birth/Sex: Treating RN: February 03, 1925 (88 y.o. [name] Primary Care Provider: [name] Other Clinician: Referring Provider: Treating Provider/Extender: [name] in Treatment: 48 Information Obtained From Patient Eyes Medical History: Negative for: Cataracts; Optic  Neuritis Ear/Nose/Mouth/Throat Medical History: Negative for: Chronic sinus problems/congestion; Middle ear problems Hematologic/Lymphatic Medical History: Positive for: Anemia Negative for: Hemophilia; Human Immunodeficiency Virus; Lymphedema; Sickle Cell Disease Respiratory Medical History: Negative for: Aspiration; Asthma; Chronic Obstructive Pulmonary Disease (COPD); Pneumothorax; Sleep Apnea; Tuberculosis Cardiovascular Medical History: Positive for: Arrhythmia - Atrial Flutter, A fibb; Congestive Heart Failure; Hypertension Negative for: Angina; Coronary Artery Disease; Deep Vein Thrombosis; Hypotension; Myocardial Infarction; Peripheral Arterial Disease; Peripheral Venous Disease; Phlebitis; Vasculitis Past Medical History Notes: hyperlipidemia Gastrointestinal Medical History: Positive for: Colitis Negative for: Cirrhosis ; Crohns; Hepatitis A; Hepatitis B; Hepatitis C Endocrine Medical History: Negative for: Type I Diabetes; Type II Diabetes Past Medical History Notes: hypothyroidism Genitourinary Medical History: Negative for: End Stage Renal Disease Immunological Medical History: Negative for: Lupus Erythematosus; Raynauds; Scleroderma Integumentary (Skin) Medical History: Negative for: History of Burn Musculoskeletal Medical History: Positive for: Osteoarthritis Negative for: Gout; Rheumatoid Arthritis; Osteomyelitis Neurologic Medical History: Negative for: Dementia; Neuropathy; Quadriplegia; Paraplegia; Seizure Disorder Past Medical History Notes: lumbar spindylolysis Oncologic Medical History: Negative for: Received Chemotherapy; Received Radiation Past Medical History Notes: BLE squamous  ceel carcionoma Immunizations Pneumococcal Vaccine: Received Pneumococcal Vaccination: Yes Received Pneumococcal Vaccination On or After 60th Birthday: No Implantable Devices None Hospitalization / Surgery History Type of Hospitalization/Surgery removal of  rod left leg Family and Social History Unknown History: Yes; Never smoker; Marital Status - Single; Alcohol Use: Never; Drug Use: No History; Caffeine Use: Never; Financial Concerns: No; Food, Clothing or Shelter Needs: No; Support System Lacking: No; Transportation Concerns: No Electronic Signature(s) Signed: 07/18/2021 12:16:24 PM By: Kalman Shan DO Signed: 07/18/2021 5:00:34 PM By: [name] Pilling RN, BSN Entered By: Kalman Shan on 07/18/2021 12:03:14 -------------------------------------------------------------------------------- SuperBill Details Patient Name: Date of Service: [name], Chi A. 07/18/2021 Medical Record Number: [id number] Patient Account Number: [id number] Date of Birth/Sex: Treating RN: 03-21-1925 (88 y.o. [name], [name] Primary Care Provider: [name] Other Clinician: Referring Provider: Treating Provider/Extender: [name] in Treatment: 63 Diagnosis Coding ICD-10 Codes Code Description C44.92 Squamous cell carcinoma of skin, unspecified L97.812 Non-pressure chronic ulcer of other part of right lower leg with fat layer exposed L97.822 Non-pressure chronic ulcer of other part of left lower leg with fat layer exposed I89.0 Lymphedema, not elsewhere classified I87.313 Chronic venous hypertension (idiopathic) with ulcer of bilateral lower extremity Facility Procedures CPT4: Code [id number] Description: 81 BILATERAL: Application of multi-layer venous compression system; leg (below knee), including ankle and t. Modifier: Quantity: 1 Physician Procedures : CPT4 Code Description Modifier [id number] - WC PHYS LEVEL 3 - EST PT ICD-10 Diagnosis Description C44.92 Squamous cell carcinoma of skin, unspecified L97.812 Non-pressure chronic ulcer of other part of right lower leg with fat layer exposed L97.822  Non-pressure chronic ulcer of other part of left lower leg with fat layer exposed I89.0 Lymphedema,  not elsewhere classified Quantity: 1 Electronic Signature(s) Signed: 07/18/2021 12:16:24 PM By: Kalman Shan DO Entered By: Kalman Shan on 07/18/2021 12:05:51

## 2021-07-19 ENCOUNTER — Telehealth: Payer: Self-pay | Admitting: Oncology

## 2021-07-19 NOTE — Telephone Encounter (Signed)
Called patient regarding upcoming July and August appointments, patient has been called and notified.  

## 2021-07-19 NOTE — Progress Notes (Signed)
LINNET BOTTARI ([id number]) , Visit Report for 07/18/2021 Arrival Information Details Patient Name: Date of Service: [name], [name] 07/18/2021 11:15 A M Medical Record Number: [id number] Patient Account Number: [id number] Date of Birth/Sex: Treating RN: [name] (88 y.o. [name] Primary Care [name]: [name] Other Clinician: Referring [name]: Treating [name] in Treatment: 23 Visit Information History Since Last Visit Added or deleted any medications: No Patient Arrived: Walker Any new allergies or adverse reactions: No Arrival Time: 11:35 Had a fall or experienced change in No Accompanied By: self activities of daily living that [name] affect Transfer Assistance: None risk of falls: Patient Identification Verified: Yes Signs or symptoms of abuse/neglect since last visito No Secondary Verification Process Completed: Yes Hospitalized since last visit: No Patient Requires Transmission-Based No Implantable device outside of the clinic excluding No Precautions: cellular tissue based products placed in the center Patient Has Alerts: Yes since last visit: Patient Alerts: R ABI = Non compressible Has Dressing in Place as Prescribed: Yes L ABI = Non compressible Has Compression in Place as Prescribed: No Pain Present Now: No Notes per patient seen Dr. 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 Patient to see them weekly on Thursdays then wound care center to follow. Electronic Signature(s) Signed: 07/18/2021 5:00:34 PM By: [name] Pilling RN, BSN Entered By: [name] on 07/18/2021 11:52:59 -------------------------------------------------------------------------------- Compression Therapy Details Patient Name: Date of Service: [name], [name] 07/18/2021 11:15 A M Medical Record Number: [id number] Patient Account Number: [id number] Date of Birth/Sex: Treating  RN: [name] (88 y.o. [name] Primary Care [name]: [name] Other Clinician: Referring [name]: Treating [name]/Extender: [name] in Treatment: 26 Compression Therapy Performed for Wound Assessment: Wound #20 Bell,Circumferential Lower Leg Performed By: Clinician [name] Pilling, RN Compression Type: Three Layer Post Procedure Diagnosis Same as Pre-procedure Electronic Signature(s) Signed: 07/18/2021 5:00:34 PM By: [name] Pilling RN, BSN Entered By: [name] on 07/18/2021 11:54:50 -------------------------------------------------------------------------------- Compression Therapy Details Patient Name: Date of Service: [name], [name] 07/18/2021 11:15 A M Medical Record Number: [id number] Patient Account Number: [id number] Date of Birth/Sex: Treating RN: [birth year] (88 y.o. [name] Primary Care [name]: [name] Other Clinician: Referring [name]: Treating [name]/Extender: [name] in Treatment: 36 Compression Therapy Performed for Wound Assessment: Wound #7RR Left,Circumferential Lower Leg Performed By: Clinician [name] Pilling, RN Compression Type: Three Layer Post Procedure Diagnosis Same as Pre-procedure Electronic Signature(s) Signed: 07/18/2021 5:00:34 PM By: [name] Pilling RN, BSN Entered By: [name] on 07/18/2021 11:54:50 -------------------------------------------------------------------------------- Encounter Discharge Information Details Patient Name: Date of Service: [name], [name] 07/18/2021 11:15 A M Medical Record Number: [id number] Patient Account Number: [id number] Date of Birth/Sex: Treating RN: [birth year] (88 y.o. [name] Primary Care Gaynor Ferreras: [name] Other Clinician: Referring [name]: Treating [name]/Extender: [name] in Treatment: 61 Encounter Discharge  Information Items Discharge Condition: Stable Ambulatory Status: Walker Discharge Destination: Home Transportation: Private Auto Accompanied By: self Schedule Follow-up Appointment: Yes Clinical Summary of Care: Electronic Signature(s) Signed: 07/18/2021 5:00:34 PM By: [name] Pilling RN, BSN Entered By: [name] on 07/18/2021 11:59:02 -------------------------------------------------------------------------------- Lower Extremity Assessment Details Patient Name: Date of Service: [name], [name] 07/18/2021 11:15 A M Medical Record Number: [id number] Patient Account Number: [id number] Date of Birth/Sex: Treating RN: [birth year] (88 y.o. [name] Primary Care [name] Other Clinician: Referring  [name]: Treating [name] in Treatment: 52 Edema Assessment Assessed: [Left: Yes] [Bell: Yes] Edema: [Left: Yes] [Bell: Yes] Calf Left: Bell: Point of Measurement: 30 cm From Medial Instep 32 cm 32 cm Ankle Left: Bell: Point of Measurement: 9 cm From Medial Instep 22 cm 22 cm Vascular Assessment Pulses: Dorsalis Pedis Palpable: [Left:Yes] [Bell:Yes] Electronic Signature(s) Signed: 07/18/2021 5:00:34 PM By: [name] Pilling RN, BSN Entered By: [name] on 07/18/2021 11:54:36 -------------------------------------------------------------------------------- Multi Wound Chart Details Patient Name: Date of Service: [name], [name] 07/18/2021 11:15 A M Medical Record Number: [id number] Patient Account Number: [id number] Date of Birth/Sex: Treating RN: [birth year] Bell (88 y.o. [name] Primary Care [name]: [name] Other Clinician: Referring [name]: Treating [name]/Extender: [name] in Treatment: 94 Vital Signs Height(in): 65 Pulse(bpm): 97 Weight(lbs): 163 Blood Pressure(mmHg): 109/68 Body Mass Index(BMI):  27.1 Temperature(F): 98.2 Respiratory Rate(breaths/min): 20 Photos: [N/A:N/A] Bell, Circumferential Lower Leg Left, Circumferential Lower Leg N/A Wound Location: Gradually Appeared Blister N/A Wounding Event: Lymphedema Malignant Wound N/A Primary Etiology: Anemia, Arrhythmia, Congestive Heart Anemia, Arrhythmia, Congestive Heart N/A Comorbid History: Failure, Hypertension, Colitis, Failure, Hypertension, Colitis, Osteoarthritis Osteoarthritis 06/25/2021 04/20/2020 N/A Date Acquired: 3 57 N/A Weeks of Treatment: Open Open N/A Wound Status: No Yes N/A Wound Recurrence: Yes Yes N/A Clustered Wound: 10 3 N/A Clustered Quantity: 34x31x0.1 9.5x4.5x0.1 N/A Measurements L x W x D (cm) 827.81 33.576 N/A A (cm) : rea 82.781 3.358 N/A Volume (cm) : -108.30% -993.30% N/A % Reduction in Area: -108.30% -993.80% N/A % Reduction in Volume: Full Thickness Without Exposed Full Thickness Without Exposed N/A Classification: Support Structures Support Structures Large Large N/A Exudate Amount: Serosanguineous Serosanguineous N/A Exudate Type: red, brown red, brown N/A Exudate Color: Distinct, outline attached Distinct, outline attached N/A Wound Margin: Large (67-100%) Large (67-100%) N/A Granulation Amount: Pink Red, Pink N/A Granulation Quality: N/A None Present (0%) N/A Necrotic Amount: Fat Layer (Subcutaneous Tissue): Yes Fat Layer (Subcutaneous Tissue): Yes N/A Exposed Structures: Fascia: No Fascia: No Tendon: No Tendon: No Muscle: No Muscle: No Joint: No Joint: No Bone: No Bone: No None Small (1-33%) N/A Epithelialization: Maceration present Maceration present N/A Assessment Notes: Compression Therapy Compression Therapy N/A Procedures Performed: Treatment Notes Electronic Signature(s) Signed: 07/18/2021 12:16:24 PM By: Kalman Shan DO Signed: 07/18/2021 5:00:34 PM By: [name] Pilling RN, BSN Entered By: Kalman Shan on 07/18/2021  11:58:52 -------------------------------------------------------------------------------- Multi-Disciplinary Care Plan Details Patient Name: Date of Service: [name], [name] 07/18/2021 11:15 A M Medical Record Number: [id number] Patient Account Number: [id number] Date of Birth/Sex: Treating RN: [birth year]Bell (88 y.o. [name] Primary Care Alga Southall: [name] Other Clinician: Referring Haliegh Khurana: Treating Rokia Bosket/Extender: [name] in Treatment: 17 Multidisciplinary Care Plan reviewed with physician Active Inactive Venous Leg Ulcer Nursing Diagnoses: Knowledge deficit related to disease process and management Potential for venous Insuffiency (use before diagnosis confirmed) Goals: Patient will maintain optimal edema control Date Initiated: 11/01/2020 Target Resolution Date: 08/16/2021 Goal Status: Active Interventions: Assess peripheral edema status every visit. Compression as ordered Provide education on venous insufficiency Treatment Activities: Therapeutic compression applied : 11/01/2020 Notes: 12/27/20: Edema control ongoing 01/24/21: Edema control continues, using 3 layer compression Wound/Skin Impairment Nursing Diagnoses: Impaired tissue integrity Knowledge deficit related to ulceration/compromised skin integrity Goals: Patient/caregiver will verbalize understanding of skin care regimen Date Initiated: 06/11/2020 Target Resolution Date: 08/16/2021 Goal Status: Active Interventions: Assess patient/caregiver ability to obtain necessary supplies Assess patient/caregiver ability to perform ulcer/skin care regimen  upon admission and as needed Assess ulceration(s) every visit Provide education on ulcer and skin care Notes: 10/11/20: Wound care regimen ongoing. 12/27/20: Wound care continues Electronic Signature(s) Signed: 07/18/2021 5:00:34 PM By: [name] Pilling RN, BSN Entered By: [name] on 07/18/2021  11:58:13 -------------------------------------------------------------------------------- Pain Assessment Details Patient Name: Date of Service: [name], [name] 07/18/2021 11:15 A M Medical Record Number: [id number] Patient Account Number: [id number] Date of Birth/Sex: Treating RN: [birth year] (88 y.o. [name] Primary Care Gen Clagg: [name] Other Clinician: Referring Sandro Burgo: Treating Bernarda Erck/Extender: [name] in Treatment: 72 Active Problems Location of Pain Severity and Description of Pain Patient Has Paino No Site Locations Rate the pain. Current Pain Level: 0 Pain Management and Medication Current Pain Management: Medication: No Cold Application: No Rest: No Massage: No Activity: No T.E.N.S.: No Heat Application: No Leg drop or elevation: No Is the Current Pain Management Adequate: Inadequate How does your wound impact your activities of daily livingo Sleep: No Bathing: No Appetite: No Relationship With Others: No Bladder Continence: No Emotions: No Bowel Continence: No Work: No Toileting: No Drive: No Dressing: No Hobbies: No Engineer, maintenance) Signed: 07/18/2021 5:00:34 PM By: [name] Pilling RN, BSN Entered By: [name] on 07/18/2021 11:54:26 -------------------------------------------------------------------------------- Patient/Caregiver Education Details Patient Name: Date of Service: [name], Lashon A. 6/29/2023andnbsp11:15 A M Medical Record Number: [id number] Patient Account Number: [id number] Date of Birth/Gender: Treating RN: [birth year] Bell (88 y.o. [name] Primary Care Physician: [name] Other Clinician: Referring Physician: Treating Physician/Extender: [name] in Treatment: 2 Education Assessment Education Provided To: Patient Education Topics Provided Wound/Skin Impairment: Handouts: Skin Care Do's and  Dont's Methods: Explain/Verbal Responses: Reinforcements needed Electronic Signature(s) Signed: 07/18/2021 5:00:34 PM By: [name] Pilling RN, BSN Entered By: [name] on 07/18/2021 11:58:23 -------------------------------------------------------------------------------- Wound Assessment Details Patient Name: Date of Service: [name], [name] 07/18/2021 11:15 A M Medical Record Number: [id number] Patient Account Number: [id number] Date of Birth/Sex: Treating RN: [birth year] (88 y.o. [name], [name] Primary Care Merritt Kibby: [name] Other Clinician: Referring Harald Quevedo: Treating Chibuikem Thang/Extender: [name] in Treatment: 32 Wound Status Wound Number: 20 Primary Lymphedema Etiology: Wound Location: Bell, Circumferential Lower Leg Wound Open Wounding Event: Gradually Appeared Status: Date Acquired: 06/25/2021 Comorbid Anemia, Arrhythmia, Congestive Heart Failure, Hypertension, Weeks Of Treatment: 3 History: Colitis, Osteoarthritis Clustered Wound: Yes Photos Wound Measurements Length: (cm) 34 Width: (cm) 31 Depth: (cm) 0.1 Clustered Quantity: 10 Area: (cm) 827.81 Volume: (cm) 82.781 % Reduction in Area: -108.3% % Reduction in Volume: -108.3% Epithelialization: None Tunneling: No Undermining: No Wound Description Classification: Full Thickness Without Exposed Support Structures Wound Margin: Distinct, outline attached Exudate Amount: Large Exudate Type: Serosanguineous Exudate Color: red, brown Foul Odor After Cleansing: No Slough/Fibrino No Wound Bed Granulation Amount: Large (67-100%) Exposed Structure Granulation Quality: Pink Fascia Exposed: No Fat Layer (Subcutaneous Tissue) Exposed: Yes Tendon Exposed: No Muscle Exposed: No Joint Exposed: No Bone Exposed: No Assessment Notes Maceration present Treatment Notes Wound #20 (Lower Leg) Wound Laterality: Bell, Circumferential Cleanser Soap and  Water Discharge Instruction: [name] shower and wash wound with dial antibacterial soap and water prior to dressing change. Wound Cleanser Discharge Instruction: Cleanse the wound with wound cleanser prior to applying a clean dressing using gauze sponges, not tissue or cotton balls. Peri-Wound Care Sween Lotion (Moisturizing lotion) Discharge Instruction: Apply moisturizing lotion as directed Topical Primary Dressing KerraCel Ag Gelling Fiber Dressing, 4x5 in (silver alginate) Discharge Instruction: Apply silver  alginate to wound bed as instructed Secondary Dressing ABD Pad, 8x10 Discharge Instruction: Apply over primary dressing as directed. Woven Gauze Sponge, Non-Sterile 4x4 in Discharge Instruction: Apply over primary dressing as directed. Secured With Compression Wrap ThreePress (3 layer compression wrap) Discharge Instruction: Apply three layer compression as directed. Compression Stockings Add-Ons Electronic Signature(s) Signed: 07/19/2021 11:43:29 AM By: Rhae Hammock RN Entered By: Rhae Hammock on 07/18/2021 11:48:46 -------------------------------------------------------------------------------- Wound Assessment Details Patient Name: Date of Service: [name], [name] 07/18/2021 11:15 A M Medical Record Number: [id number] Patient Account Number: [id number] Date of Birth/Sex: Treating RN: [birth year] (88 y.o. [name], [name] Primary Care Elias Dennington: [name] Other Clinician: Referring Meade Hogeland: Treating Marlo Arriola/Extender: [name] in Treatment: 67 Wound Status Wound Number: [id number] Primary Malignant Wound Etiology: Wound Location: Left, Circumferential Lower Leg Wound Open Wounding Event: Blister Status: Date Acquired: 04/20/2020 Comorbid Anemia, Arrhythmia, Congestive Heart Failure, Hypertension, Weeks Of Treatment: 57 History: Colitis, Osteoarthritis Clustered Wound: Yes Photos Wound Measurements Length: (cm)  9.5 Width: (cm) 4.5 Depth: (cm) 0.1 Clustered Quantity: 3 Area: (cm) 33 Volume: (cm) 3. % Reduction in Area: -993.3% % Reduction in Volume: -993.8% Epithelialization: Small (1-33%) Tunneling: No .576 Undermining: No 358 Wound Description Classification: Full Thickness Without Exposed Support Structures Wound Margin: Distinct, outline attached Exudate Amount: Large Exudate Type: Serosanguineous Exudate Color: red, brown Foul Odor After Cleansing: No Slough/Fibrino No Wound Bed Granulation Amount: Large (67-100%) Exposed Structure Granulation Quality: Red, Pink Fascia Exposed: No Necrotic Amount: None Present (0%) Fat Layer (Subcutaneous Tissue) Exposed: Yes Tendon Exposed: No Muscle Exposed: No Joint Exposed: No Bone Exposed: No Assessment Notes Maceration present Treatment Notes Wound #7RR (Lower Leg) Wound Laterality: Left, Circumferential Cleanser Soap and Water Discharge Instruction: [name] shower and wash wound with dial antibacterial soap and water prior to dressing change. Wound Cleanser Discharge Instruction: Cleanse the wound with wound cleanser prior to applying a clean dressing using gauze sponges, not tissue or cotton balls. Peri-Wound Care Sween Lotion (Moisturizing lotion) Discharge Instruction: Apply moisturizing lotion as directed Topical Primary Dressing KerraCel Ag Gelling Fiber Dressing, 4x5 in (silver alginate) Discharge Instruction: Apply silver alginate to wound bed as instructed Secondary Dressing ABD Pad, 8x10 Discharge Instruction: Apply over primary dressing as directed. Woven Gauze Sponge, Non-Sterile 4x4 in Discharge Instruction: Apply over primary dressing as directed. Secured With Compression Wrap ThreePress (3 layer compression wrap) Discharge Instruction: Apply three layer compression as directed. Compression Stockings Add-Ons Electronic Signature(s) Signed: 07/19/2021 11:43:29 AM By: Rhae Hammock RN Entered By: Rhae Hammock on 07/18/2021 11:49:30 -------------------------------------------------------------------------------- Vitals Details Patient Name: Date of Service: [name], Mikeila A. 07/18/2021 11:15 A M Medical Record Number: [id number] Patient Account Number: [id number] Date of Birth/Sex: Treating RN: [birth year]Bell (88 y.o. [name] Primary Care Barbarajean Kinzler: [name] Other Clinician: Referring Aariv Medlock: Treating Channing Savich/Extender: [name] in Treatment: 46 Vital Signs Time Taken: 11:35 Temperature (F): 98.2 Height (in): 65 Pulse (bpm): 97 Weight (lbs): 163 Respiratory Rate (breaths/min): 20 Body Mass Index (BMI): 27.1 Blood Pressure (mmHg): 109/68 Reference Range: 80 - 120 mg / dl Electronic Signature(s) Signed: 07/18/2021 5:00:34 PM By: [name] Pilling RN, BSN Entered By: [name] on 07/18/2021 11:53:17

## 2021-07-25 ENCOUNTER — Encounter (HOSPITAL_BASED_OUTPATIENT_CLINIC_OR_DEPARTMENT_OTHER): Payer: Medicare Other | Attending: Internal Medicine | Admitting: Internal Medicine

## 2021-07-25 DIAGNOSIS — Z853 Personal history of malignant neoplasm of breast: Secondary | ICD-10-CM | POA: Insufficient documentation

## 2021-07-25 DIAGNOSIS — I890 Lymphedema, not elsewhere classified: Secondary | ICD-10-CM | POA: Diagnosis not present

## 2021-07-25 DIAGNOSIS — L97909 Non-pressure chronic ulcer of unspecified part of unspecified lower leg with unspecified severity: Secondary | ICD-10-CM | POA: Diagnosis not present

## 2021-07-25 DIAGNOSIS — C44721 Squamous cell carcinoma of skin of unspecified lower limb, including hip: Secondary | ICD-10-CM | POA: Diagnosis not present

## 2021-07-25 DIAGNOSIS — L97822 Non-pressure chronic ulcer of other part of left lower leg with fat layer exposed: Secondary | ICD-10-CM | POA: Insufficient documentation

## 2021-07-25 DIAGNOSIS — I87313 Chronic venous hypertension (idiopathic) with ulcer of bilateral lower extremity: Secondary | ICD-10-CM | POA: Diagnosis not present

## 2021-07-25 DIAGNOSIS — L97812 Non-pressure chronic ulcer of other part of right lower leg with fat layer exposed: Secondary | ICD-10-CM | POA: Insufficient documentation

## 2021-07-25 DIAGNOSIS — C4492 Squamous cell carcinoma of skin, unspecified: Secondary | ICD-10-CM | POA: Diagnosis not present

## 2021-07-25 DIAGNOSIS — E039 Hypothyroidism, unspecified: Secondary | ICD-10-CM | POA: Insufficient documentation

## 2021-07-25 DIAGNOSIS — Z79899 Other long term (current) drug therapy: Secondary | ICD-10-CM | POA: Diagnosis not present

## 2021-07-25 DIAGNOSIS — C44622 Squamous cell carcinoma of skin of right upper limb, including shoulder: Secondary | ICD-10-CM | POA: Diagnosis not present

## 2021-07-25 NOTE — Progress Notes (Signed)
[name] ([id number]) , Visit Report for 07/25/2021 Arrival Information Details Patient Name: Date of Service: [name], [place]. 07/25/2021 1:30 PM Medical Record Number: [id number] Patient Account Number: [id number] Date of Birth/Sex: Treating RN: 05/30/1925 (88 y.o. [name], [name] Primary Care Alexsa Flaum: [name] Other Clinician: Referring Jordyn Doane: Treating Brelynn Wheller/Extender: [name] in Treatment: 16 Visit Information History Since Last Visit Added or deleted any medications: No Patient Arrived: Walker Any new allergies or adverse reactions: No Arrival Time: 13:30 Had a fall or experienced change in No Accompanied By: Self activities of daily living that may affect Transfer Assistance: None risk of falls: Patient Identification Verified: Yes Signs or symptoms of abuse/neglect since last visito No Secondary Verification Process Completed: Yes Hospitalized since last visit: No Patient Requires Transmission-Based No Implantable device outside of the clinic excluding No Precautions: cellular tissue based products placed in the center Patient Has Alerts: Yes since last visit: Patient Alerts: R ABI = Non compressible Has Dressing in Place as Prescribed: No L ABI = Non compressible Has Compression in Place as Prescribed: Yes Pain Present Now: Yes Notes Wraps above the knees by dermatologist. Electronic Signature(s) Signed: 07/25/2021 4:18:35 PM By: Deon Pilling RN, BSN Entered By: Deon Pilling on 07/25/2021 13:52:04 -------------------------------------------------------------------------------- Compression Therapy Details Patient Name: Date of Service: [name], [name] 07/25/2021 1:30 PM Medical Record Number: [id number] Patient Account Number: [id number] Date of Birth/Sex: Treating RN: [birth year] (88 y.o. [name] Primary Care Alannah Averhart: [name] Other Clinician: Referring Zebulen Simonis: Treating  Mory Herrman/Extender: [name] in Treatment: 49 Compression Therapy Performed for Wound Assessment: Wound #20 Right,Circumferential Lower Leg Performed By: Clinician Deon Pilling, RN Compression Type: Three Layer Post Procedure Diagnosis Same as Pre-procedure Electronic Signature(s) Signed: 07/25/2021 4:18:35 PM By: Deon Pilling RN, BSN Entered By: Deon Pilling on 07/25/2021 14:00:31 -------------------------------------------------------------------------------- Compression Therapy Details Patient Name: Date of Service: [name], [name]. 07/25/2021 1:30 PM Medical Record Number: [id number] Patient Account Number: [id number] Date of Birth/Sex: Treating RN: [birth year] (88 y.o. [name] Primary Care Corbet Hanley: [name] Other Clinician: Referring Dinna Severs: Treating Aldan Camey/Extender: [name] in Treatment: 23 Compression Therapy Performed for Wound Assessment: Wound #7RR Left,Circumferential Lower Leg Performed By: Clinician Deon Pilling, RN Compression Type: Three Layer Post Procedure Diagnosis Same as Pre-procedure Electronic Signature(s) Signed: 07/25/2021 4:18:35 PM By: Deon Pilling RN, BSN Entered By: Deon Pilling on 07/25/2021 14:00:32 -------------------------------------------------------------------------------- Encounter Discharge Information Details Patient Name: Date of Service: [name], [name]. 07/25/2021 1:30 PM Medical Record Number: [id number] Patient Account Number: [id number] Date of Birth/Sex: Treating RN: [birth year] (88 y.o. [name] Primary Care Jeren Dufrane: [name] Other Clinician: Referring Garrette Caine: Treating Amandy Chubbuck/Extender: [name] in Treatment: 59 Encounter Discharge Information Items Discharge Condition: Stable Ambulatory Status: Walker Discharge Destination: Home Transportation: Private Auto Accompanied  By: self Schedule Follow-up Appointment: Yes Clinical Summary of Care: Electronic Signature(s) Signed: 07/25/2021 4:18:35 PM By: Deon Pilling RN, BSN Entered By: Deon Pilling on 07/25/2021 14:03:06 -------------------------------------------------------------------------------- Lower Extremity Assessment Details Patient Name: Date of Service: [name], [name]. 07/25/2021 1:30 PM Medical Record Number: [id number] Patient Account Number: [id number] Date of Birth/Sex: Treating RN: Oct 31, 1925 (88 y.o. [name] Primary Care Renesha Lizama: [name] Other Clinician: Referring Hayven Croy: Treating Wiktoria Hemrick/Extender: [name] in Treatment: 27 Edema Assessment Assessed: [Left: No] [Right: No] Edema: [Left: Yes] [Right: Yes] Calf Left: Right: Point of Measurement:  30 cm From Medial Instep 32 cm 32 cm Ankle Left: Right: Point of Measurement: 9 cm From Medial Instep 22 cm 22 cm Electronic Signature(s) Signed: 07/25/2021 4:18:35 PM By: Deon Pilling RN, BSN Entered By: Deon Pilling on 07/25/2021 13:54:23 -------------------------------------------------------------------------------- Multi Wound Chart Details Patient Name: Date of Service: [name], [name]. 07/25/2021 1:30 PM Medical Record Number: [id number] Patient Account Number: [id number] Date of Birth/Sex: Treating RN: 03-08-1925 (88 y.o. [name], [name] Primary Care Nahome Bublitz: [name] Other Clinician: Referring Naydeline Morace: Treating Miyu Fenderson/Extender: [name] in Treatment: 58 Vital Signs Height(in): 65 Pulse(bpm): 61 Weight(lbs): 163 Blood Pressure(mmHg): 130/66 Body Mass Index(BMI): 27.1 Temperature(F): Respiratory Rate(breaths/min): 22 Photos: [N/A:N/A] Right, Circumferential Lower Leg Left, Circumferential Lower Leg N/A Wound Location: Gradually Appeared Blister N/A Wounding Event: Lymphedema Malignant Wound N/A Primary  Etiology: Anemia, Arrhythmia, Congestive Heart Anemia, Arrhythmia, Congestive Heart N/A Comorbid History: Failure, Hypertension, Colitis, Failure, Hypertension, Colitis, Osteoarthritis Osteoarthritis 06/25/2021 04/20/2020 N/A Date Acquired: 4 58 N/A Weeks of Treatment: Open Open N/A Wound Status: No Yes N/A Wound Recurrence: Yes Yes N/A Clustered Wound: 8 8 N/A Clustered Quantity: 32x21x0.1 18x23x0.1 N/A Measurements L x W x D (cm) 527.788 325.155 N/A A (cm) : rea 52.779 32.515 N/A Volume (cm) : -32.80% -10487.90% N/A % Reduction in Area: -32.80% -10491.20% N/A % Reduction in Volume: Full Thickness Without Exposed Full Thickness Without Exposed N/A Classification: Support Structures Support Structures Medium Medium N/A Exudate Amount: Serosanguineous Serosanguineous N/A Exudate Type: red, brown red, brown N/A Exudate Color: Distinct, outline attached Distinct, outline attached N/A Wound Margin: Large (67-100%) Large (67-100%) N/A Granulation Amount: Pink Red, Pink N/A Granulation Quality: N/A None Present (0%) N/A Necrotic Amount: Fat Layer (Subcutaneous Tissue): Yes Fat Layer (Subcutaneous Tissue): Yes N/A Exposed Structures: Fascia: No Fascia: No Tendon: No Tendon: No Muscle: No Muscle: No Joint: No Joint: No Bone: No Bone: No Small (1-33%) Small (1-33%) N/A Epithelialization: Compression Therapy Compression Therapy N/A Procedures Performed: Treatment Notes Wound #20 (Lower Leg) Wound Laterality: Right, Circumferential Cleanser Soap and Water Discharge Instruction: May shower and wash wound with dial antibacterial soap and water prior to dressing change. Wound Cleanser Discharge Instruction: Cleanse the wound with wound cleanser prior to applying a clean dressing using gauze sponges, not tissue or cotton balls. Peri-Wound Care Topical Primary Dressing KerraCel Ag Gelling Fiber Dressing, 4x5 in (silver alginate) Discharge Instruction: Apply  silver alginate to wound bed as instructed Secondary Dressing ABD Pad, 8x10 Discharge Instruction: Apply over primary dressing as directed. Woven Gauze Sponge, Non-Sterile 4x4 in Discharge Instruction: Apply over primary dressing as directed. Secured With Compression Wrap ThreePress (3 layer compression wrap) Discharge Instruction: Apply three layer compression as directed. Compression Stockings Add-Ons Wound #7RR (Lower Leg) Wound Laterality: Left, Circumferential Cleanser Soap and Water Discharge Instruction: May shower and wash wound with dial antibacterial soap and water prior to dressing change. Wound Cleanser Discharge Instruction: Cleanse the wound with wound cleanser prior to applying a clean dressing using gauze sponges, not tissue or cotton balls. Peri-Wound Care Topical Primary Dressing KerraCel Ag Gelling Fiber Dressing, 4x5 in (silver alginate) Discharge Instruction: Apply silver alginate to wound bed as instructed Secondary Dressing ABD Pad, 8x10 Discharge Instruction: Apply over primary dressing as directed. Woven Gauze Sponge, Non-Sterile 4x4 in Discharge Instruction: Apply over primary dressing as directed. Secured With Compression Wrap ThreePress (3 layer compression wrap) Discharge Instruction: Apply three layer compression as directed. Compression Stockings Add-Ons Electronic Signature(s) Signed: 07/25/2021 2:35:21 PM By: Kalman Shan DO Signed: 07/25/2021  4:18:35 PM By: Deon Pilling RN, BSN Entered By: Kalman Shan on 07/25/2021 14:28:15 -------------------------------------------------------------------------------- Multi-Disciplinary Care Plan Details Patient Name: Date of Service: [name], [place]. 07/25/2021 1:30 PM Medical Record Number: [id number] Patient Account Number: [id number] Date of Birth/Sex: Treating RN: [birth year] (88 y.o. [name] Primary Care Ashani Pumphrey: [name] Other Clinician: Referring Chaquetta Schlottman: Treating  Curt Oatis/Extender: [name] in Treatment: 78 Multidisciplinary Care Plan reviewed with physician Active Inactive Venous Leg Ulcer Nursing Diagnoses: Knowledge deficit related to disease process and management Potential for venous Insuffiency (use before diagnosis confirmed) Goals: Patient will maintain optimal edema control Date Initiated: 11/01/2020 Target Resolution Date: 08/16/2021 Goal Status: Active Interventions: Assess peripheral edema status every visit. Compression as ordered Provide education on venous insufficiency Treatment Activities: Therapeutic compression applied : 11/01/2020 Notes: 12/27/20: Edema control ongoing 01/24/21: Edema control continues, using 3 layer compression Wound/Skin Impairment Nursing Diagnoses: Impaired tissue integrity Knowledge deficit related to ulceration/compromised skin integrity Goals: Patient/caregiver will verbalize understanding of skin care regimen Date Initiated: 06/11/2020 Target Resolution Date: 08/16/2021 Goal Status: Active Interventions: Assess patient/caregiver ability to obtain necessary supplies Assess patient/caregiver ability to perform ulcer/skin care regimen upon admission and as needed Assess ulceration(s) every visit Provide education on ulcer and skin care Notes: 10/11/20: Wound care regimen ongoing. 12/27/20: Wound care continues Electronic Signature(s) Signed: 07/25/2021 4:18:35 PM By: Deon Pilling RN, BSN Entered By: Deon Pilling on 07/25/2021 14:01:49 -------------------------------------------------------------------------------- Pain Assessment Details Patient Name: Date of Service: [name], [place]. 07/25/2021 1:30 PM Medical Record Number: [id number] Patient Account Number: [id number] Date of Birth/Sex: Treating RN: May 06, 1925 (88 y.o. [name] Primary Care Adriyanna Christians: [name] Other Clinician: Referring Honor Frison: Treating Laveta Gilkey/Extender: [name] in Treatment: 58 Active Problems Location of Pain Severity and Description of Pain Patient Has Paino Yes Site Locations Rate the pain. Current Pain Level: 9 Pain Management and Medication Current Pain Management: Notes Per patient wrap cutting in thigh. Electronic Signature(s) Signed: 07/25/2021 4:18:35 PM By: Deon Pilling RN, BSN Entered By: Deon Pilling on 07/25/2021 13:54:19 -------------------------------------------------------------------------------- Patient/Caregiver Education Details Patient Name: Date of Service: [name], [place]. 7/6/2023andnbsp1:30 PM Medical Record Number: [id number] Patient Account Number: [id number] Date of Birth/Gender: Treating RN: 11/20/1925 (88 y.o. [name] Primary Care Physician: [name] Other Clinician: Referring Physician: Treating Physician/Extender: [name] in Treatment: 41 Education Assessment Education Provided To: Patient Education Topics Provided Wound/Skin Impairment: Handouts: Skin Care Do's and Dont's Methods: Explain/Verbal Responses: Reinforcements needed Electronic Signature(s) Signed: 07/25/2021 4:18:35 PM By: Deon Pilling RN, BSN Entered By: Deon Pilling on 07/25/2021 14:02:08 -------------------------------------------------------------------------------- Wound Assessment Details Patient Name: Date of Service: [name], [place]. 07/25/2021 1:30 PM Medical Record Number: [id number] Patient Account Number: [id number] Date of Birth/Sex: Treating RN: [birth year] (88 y.o. [name], [name] Primary Care Eoghan Belcher: [name] Other Clinician: Referring Nikoletta Varma: Treating Mahmood Boehringer/Extender: [name] in Treatment: 98 Wound Status Wound Number: 20 Primary Lymphedema Etiology: Wound Location: Right, Circumferential Lower Leg Wound Open Wounding Event: Gradually Appeared Status: Date  Acquired: 06/25/2021 Comorbid Anemia, Arrhythmia, Congestive Heart Failure, Hypertension, Weeks Of Treatment: 4 History: Colitis, Osteoarthritis Clustered Wound: Yes Photos Wound Measurements Length: (cm) 32 Width: (cm) 21 Depth: (cm) 0.1 Clustered Quantity: 8 Area: (cm) 527.788 Volume: (cm) 52.779 % Reduction in Area: -32.8% % Reduction in Volume: -32.8% Epithelialization: Small (1-33%) Tunneling: No Undermining: No Wound Description Classification: Full Thickness Without Exposed Support Structures Wound Margin:  Distinct, outline attached Exudate Amount: Medium Exudate Type: Serosanguineous Exudate Color: red, brown Foul Odor After Cleansing: No Slough/Fibrino No Wound Bed Granulation Amount: Large (67-100%) Exposed Structure Granulation Quality: Pink Fascia Exposed: No Fat Layer (Subcutaneous Tissue) Exposed: Yes Tendon Exposed: No Muscle Exposed: No Joint Exposed: No Bone Exposed: No Treatment Notes Wound #20 (Lower Leg) Wound Laterality: Right, Circumferential Cleanser Soap and Water Discharge Instruction: May shower and wash wound with dial antibacterial soap and water prior to dressing change. Wound Cleanser Discharge Instruction: Cleanse the wound with wound cleanser prior to applying a clean dressing using gauze sponges, not tissue or cotton balls. Peri-Wound Care Topical Primary Dressing KerraCel Ag Gelling Fiber Dressing, 4x5 in (silver alginate) Discharge Instruction: Apply silver alginate to wound bed as instructed Secondary Dressing ABD Pad, 8x10 Discharge Instruction: Apply over primary dressing as directed. Woven Gauze Sponge, Non-Sterile 4x4 in Discharge Instruction: Apply over primary dressing as directed. Secured With Compression Wrap ThreePress (3 layer compression wrap) Discharge Instruction: Apply three layer compression as directed. Compression Stockings Add-Ons Electronic Signature(s) Signed: 07/25/2021 4:18:35 PM By: Deon Pilling RN,  BSN Entered By: Deon Pilling on 07/25/2021 13:52:46 -------------------------------------------------------------------------------- Wound Assessment Details Patient Name: Date of Service: [name], [place]. 07/25/2021 1:30 PM Medical Record Number: [id number] Patient Account Number: [id number] Date of Birth/Sex: Treating RN: 1925/02/13 (88 y.o. [name], [name] Primary Care Safaa Stingley: [name] Other Clinician: Referring Odel Schmid: Treating Marykatherine Sherwood/Extender: [name] in Treatment: 50 Wound Status Wound Number: 7RR Primary Malignant Wound Etiology: Wound Location: Left, Circumferential Lower Leg Wound Open Wounding Event: Blister Status: Date Acquired: 04/20/2020 Comorbid Anemia, Arrhythmia, Congestive Heart Failure, Hypertension, Weeks Of Treatment: 58 History: Colitis, Osteoarthritis Clustered Wound: Yes Photos Wound Measurements Length: (cm) 18 Width: (cm) 23 Depth: (cm) 0.1 Clustered Quantity: 8 Area: (cm) 325.155 Volume: (cm) 32.515 % Reduction in Area: -10487.9% % Reduction in Volume: -10491.2% Epithelialization: Small (1-33%) Tunneling: No Undermining: No Wound Description Classification: Full Thickness Without Exposed Support Structures Wound Margin: Distinct, outline attached Exudate Amount: Medium Exudate Type: Serosanguineous Exudate Color: red, brown Foul Odor After Cleansing: No Slough/Fibrino No Wound Bed Granulation Amount: Large (67-100%) Exposed Structure Granulation Quality: Red, Pink Fascia Exposed: No Necrotic Amount: None Present (0%) Fat Layer (Subcutaneous Tissue) Exposed: Yes Tendon Exposed: No Muscle Exposed: No Joint Exposed: No Bone Exposed: No Treatment Notes Wound #7RR (Lower Leg) Wound Laterality: Left, Circumferential Cleanser Soap and Water Discharge Instruction: May shower and wash wound with dial antibacterial soap and water prior to dressing change. Wound Cleanser Discharge  Instruction: Cleanse the wound with wound cleanser prior to applying a clean dressing using gauze sponges, not tissue or cotton balls. Peri-Wound Care Topical Primary Dressing KerraCel Ag Gelling Fiber Dressing, 4x5 in (silver alginate) Discharge Instruction: Apply silver alginate to wound bed as instructed Secondary Dressing ABD Pad, 8x10 Discharge Instruction: Apply over primary dressing as directed. Woven Gauze Sponge, Non-Sterile 4x4 in Discharge Instruction: Apply over primary dressing as directed. Secured With Compression Wrap ThreePress (3 layer compression wrap) Discharge Instruction: Apply three layer compression as directed. Compression Stockings Add-Ons Electronic Signature(s) Signed: 07/25/2021 4:18:35 PM By: Deon Pilling RN, BSN Entered By: Deon Pilling on 07/25/2021 13:53:22 -------------------------------------------------------------------------------- Vitals Details Patient Name: Date of Service: [name], [place]. 07/25/2021 1:30 PM Medical Record Number: [id number] Patient Account Number: [id number] Date of Birth/Sex: Treating RN: Feb 20, 1925 (88 y.o. [name] Primary Care Kayin Osment: [name] Other Clinician: Referring Karmine Kauer: Treating Chalmer Zheng/Extender: [name] in Treatment:  58 Vital Signs Time Taken: 13:40 Pulse (bpm): 71 Height (in): 65 Respiratory Rate (breaths/min): 22 Weight (lbs): 163 Blood Pressure (mmHg): 130/66 Body Mass Index (BMI): 27.1 Reference Range: 80 - 120 mg / dl Electronic Signature(s) Signed: 07/25/2021 4:18:35 PM By: Deon Pilling RN, BSN Entered By: Deon Pilling on 07/25/2021 13:53:53

## 2021-07-25 NOTE — Progress Notes (Signed)
[name] ([id number]) , Visit Report for 07/25/2021 Chief Complaint Document Details Patient Name: Date of Service: [name], [place]. 07/25/2021 1:30 PM Medical Record Number: [id number] Patient Account Number: [id number] Date of Birth/Sex: Treating RN: May 12, 1925 (88 y.o. [name] Primary Care Provider: [name] Other Clinician: Referring Provider: Treating Provider/Extender: [name] in Treatment: 64 Information Obtained from: Patient Chief Complaint Bilateral lower extremity wounds that have been biopsied and positive for squamous cell carcinoma Bilateral lower extremity wounds due to chronic venous insufficiency/lymphedema Electronic Signature(s) Signed: 07/25/2021 2:35:21 PM By: Kalman Shan DO Entered By: Kalman Shan on 07/25/2021 14:28:28 -------------------------------------------------------------------------------- HPI Details Patient Name: Date of Service: [name], [name] 07/25/2021 1:30 PM Medical Record Number: [id number] Patient Account Number: [id number] Date of Birth/Sex: Treating RN: 09-02-1925 (88 y.o. 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 She states that the wound on the right has been present for a year. The 1 on the left has opened up 1 month ago. She is followed with oncology for this issue as she had biopsies that showed squamous cell carcinoma. She is also seeing radiation oncology for treatment  options. She presents today because she would like for her wounds to be healed by [place]. She currently denies signs of infection. 6/1; patient presents for 1 week follow-up. She states she has tolerated the leg wraps well. She states these do not bother her and is happy to continue with them. She is scheduled to see her oncologist today to go over treatment options for the bilateral lower extremity squamous cell carcinoma. Radiation is currently not a recommended option. Patient states she overall feels well. 6/22; patient presents for 3-week follow-up. She has tolerated the wraps well until her last wrap where she states they were uncomfortable. She attributes this to the home health nurse. She denies signs of infection. She has started her first treatment of antibody infusions for her Bilateral lower extremity squamous cell carcinoma. She has no complaints today. 7/21; patient presents for 1 month follow-up. Unfortunately she has not had good experience with her wrap changes with home health. She would like to do her own dressing changes. She continues to do her antibody infusions. She denies signs of infection. 7/28; patient presents for 1 week follow-up. At last clinic visit she was switched to daily dressing changes due to issues with the wrap and home health placing them. Unfortunately she has developed weeping to her legs bilaterally. She would like to be placed in wraps today. She would also like to follow with [place] weekly for wrap changes instead of having home health change them. She denies signs of infection. 8/4; patient presents for 1 week follow-up. She has tolerated the Kerlix/Coban wraps well. She no longer has weeping to her legs. She took the wrap off 1 day before coming in to be able to take a shower. She has no issues or complaints today. She denies signs of infection. 8/18; patient presents for follow-up. Patient has tolerated the wraps well. She brought her Velcro compression wraps  today. She has no  issues or complaints today. She had her chemotherapy infusion yesterday without issues. She denies signs of infection. 8/25; patient presents for follow-up. She used her juxta lite compressions for the past week. It is unclear if she is able to put these on correctly since she states she has a hard time getting them to look right. She reports 2 open wounds. She currently denies signs of infection. 9/1; patient presents for 1 week follow-up. She has 1 open wound. She tolerated the compression wraps well. She currently denies signs of infection. 9/8; patient presents for 1 week follow-up. She has 2 open wounds 1 on each leg. She has tolerated the compression wrap well. She currently denies signs of infection. 9/15; patient presents for 1 week follow-up. She now has 3 wounds. 2 on the left and 1 on the right. She continues to tolerate the compression wrap well. She currently denies signs of infection. She has obtained furosemide by her primary care physician and would like to discuss when to take this. 9/22; patient presents for 1 week follow-up. She has scattered wounds on her lower extremities bilaterally. She did take furosemide twice in the past week. She does not recall having to urinate more frequently. She tolerated the 3 layer compression well. She denies signs of infection. 10/13; patient has not been here recently because of [place]. Apparently the facility was only putting gauze on her legs. This is a patient I do not normally see. 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 Ronnald Ramp at [facility] dermatology. She is quite convincing that she did not have radiation to her lower legs. It is likely she also has significant chronic venous insufficiency stasis dermatitis. We have been using silver alginate under kerlix Coban. She has obvious open areas medially on the right and areas on the left. She also has  areas of extensive dry flaking adherent areas on the right and to a lesser extent on the left anterior. Nodular areas on the left lateral lower leg left posterior calf and right mid calf medially. 10/21; patient presents for follow-up. She has no issues or complaints today. She has tolerated the 3 layer compression well bilaterally. She denies signs of infection. 10/27; patient presents for follow-up. She continues to tolerate the 3 layer compression wrap well. She denies signs of infection. 11/30; patient presents for follow-up. She has no issues or complaints today. 11/10; patient arrived in clinic today for nurse visit accompanied by her daughter from [place]. The daughter had multiple questions so we turned this into doctors visit. Apparently after the last time I saw this woman in October she went to see Dr. Ronnald Ramp but he did not take the wraps off. She previously was treated with Cemiplimab for her squamous cell carcinoma. She did not receive radiation to her legs. I had wanted Dr. Ronnald Ramp to look at this because of the exceptionally damaged skin on her lower legs bilaterally. She has odd looking wounds on the left medial lower leg also extending posteriorly which we have not made a lot of progress on. But she also has a raised hyperkeratotic nodules on the right anterior tibial area plaques on the left medial lower thigh. These are not areas that are under compression. We have been using silver alginate on any open areas Liberal TCA under 3 layer compression. 11/17; patient presents for follow-up. She had her wraps taken off yesterday for her dermatology appointment. She reports excessive  weeping to her legs bilaterally after the wraps were taken off. She currently denies signs of infection. 12/12/2020 upon evaluation today patient appears to be doing about the same in regard to her wounds. She was actually started on Cipro after having biopsies apparently at her dermatology clinic she does not  have the results back from the actual biopsy but the culture did return and apparently they placed her on the Cipro she started that just this morning. Other than that her legs appear to be doing okay at this time. 12/1; patient presents for follow-up. She has no issues or complaints today. She has started Lasix 40 mg daily to help with her bilateral leg swelling. 12/8; patient presents for follow-up. She has no issues or complaints today. 12/15; patient presents for 1 week follow-up. She has no issues or complaints today. 12/22; patient presents for follow-up. She has no issues or complaints today. 1/5; patient presents for follow-up. She has no issues or complaints today. She has tolerated the compression wraps well. 1/12; patient presents for follow-up. She is tolerated the compression wrap well and has no issues or complaints today. She denies signs of infection. 1/19; patient presents for follow-up. She took the compression wraps off 1 to 2 days ago to take a shower and did not have compression wraps replaced. She reports increased blistering throughout her legs bilaterally. She currently denies signs of infection. 1/26; patient presents for follow-up. She has no issues or complaints today. She tolerated the compression wraps well. She has not heard from oncology to schedule an appointment. 2/2; patient presents for follow-up. She has no issues or complaints today. She continues to tolerate the compression wrap well. 2/16; patient presents for follow-up. She has no issues or complaints today. 2/23; patient presents for follow-up. She has no issues or complaints today. Tomorrow she has an appointment with oncology to look at the suspicious lesion on her right lower extremity. She currently denies signs of infection. 3/2; patient presents for follow-up. She has been using her juxta light compression to the right lower extremity however there has been increased swelling and opening of wounds  throughout the legs with weeping. She currently denies signs of infection. She had no issues with the compression wrap to the left lower extremity. 3/16; patient presents for follow-up. She has no issues or complaints today. She states that she has been restarted on infusions for her squamous cell carcinoma of her legs. She has also been increased on Lasix to 40 mg twice daily. She has noticed a decrease in swelling to her legs. 3/23; patient presents for follow-up. She starts her second infusion next week for her squamous cell carcinoma lesions to her legs. She reports no issues with the compression wraps. 3/30; patient presents for follow-up. She has no issues or complaints today. 4/6; patient presents for follow-up. Her left lower extremity wounds have healed. She has her juxta light compression with her today. She has no issues or complaints today. 4/13; this is a patient we actually discharged to juxta lite stockings on the left leg the last time she was here. She came in on Monday for [place] to look at the leg and a nurse visit. She had massive swelling and numerous blisters and wound reopening. We put her back in 3 layer compression although I did not generate a note. Silver alginate on the wounded areas. We have also been doing the same on the right. In the meantime she is obtained her external compression pumps that were  previously ordered and she started to use them. She has skin cancers that are obvious on the right leg x2 her appointment with Dr. Jarome Matin of dermatology is on May 5.. She is also receiving weekly chemotherapy for recurrent presumably squamous cell carcinomas apparently has had 2 treatments 4/20; patient presents for follow-up. She has her new juxta lite compressions today. She continues to have open wounds to the left lower extremity. She has follow-up with dermatology in 2 weeks. She continues to have IV infusions for her squamous cell carcinoma lesions on her legs. 4/28;  the patient has bilateral lower extremity wounds predominantly on the right lateral upper leg, right anterior lower leg which in itself is probably a recurrent cancer as well as the left lateral lower leg. She has recurrent squamous cell carcinoma currently being treated with an IV infusion. She also has scattered nodules on her upper lower legs and thighs I am not certain of all of this is felt to be malignant as well We have been using silver alginate on the open areas wrapping her legs. She also has her external compression pumps at home but I am not clear that she is going to use these 5/2; patient presents for follow-up. She has not been taking her diuretics as prescribed. She sees Dr. Dian Situ, dermatology at the end of the week. She tolerated the compression wraps well today. She has her juxta lite compressions. 5/8; patient presents for follow-up. She saw Dr. Dian Situ, dermatology and she has nothing to report on. She has been using her juxta lite compression to the right lower extremity. She has tolerated the compression wrap well in the left lower extremity. She has not been taking her diuretic. She is scheduled to continue her infusions for her squamous cell carcinoma on her legs. 5/18; patient presents for follow-up. We wrapped her in 3 layer compression at last clinic visit. She has no issues or complaints today. 5/22; patient presents for follow-up. She has been wrapped in 3 layer compression bilaterally to her lower extremities over the past week. She has been scratching at the top of the wrap and picking at her skin. This area has increased warmth and erythema. 5/30; patient presents for follow-up. She completed Keflex with resolution of her symptoms to the right lower extremity. She no longer has increased warmth or erythema. She continues her infusions for her squamous cell carcinoma lesions. We continue to wrap her in 3 layer compression with silver alginate. She currently denies signs of  infection. 6/8; the patient went to see Dr. Ronnald Ramp of dermatology 2 days ago unfortunately we do not have his notes. They removed her compression wraps of course but did not adequately replace them. She comes in today with 3 wounds on the right lateral upper calf 1 medially below the obvious squamous cell carcinoma there is a large necrotic wound on the left lateral tibial area on the left and several blisters. The patient remains on Cemiplimad infusions for the squamous cell carcinoma bilaterally. She is not felt to be a good candidate for radiation. Some of the skin changes superiorly in the upper legs bilaterally according to Dr. Ronnald Ramp related to these infusions the same like fleshy nodules to me not blisters 6/13; patient presents for follow-up. We have been using silver alginate under 3 layer compression bilaterally. She has no issues or complaints today. 6/19; patient presents for follow-up. She will see her oncologist in 3 days and next week her dermatologist. She has no new complaints today.  We are using silver alginate under 3 layer compression bilaterally. 6/29; patient presents for follow-up. She she saw Dr. Darrin Nipper last week and States she received injections to the lesions on her legs. We will ask for office visit records. She could not be wrapped with compression and has more drainage on exam today. 7/6; patient presents for follow-up. She continues to receive injections to the lesions in her legs by her dermatologist. She had this done earlier today. She has no issues or complaints today. Electronic Signature(s) Signed: 07/25/2021 2:35:21 PM By: Kalman Shan DO Entered By: Kalman Shan on 07/25/2021 14:29:10 -------------------------------------------------------------------------------- Physical Exam Details Patient Name: Date of Service: [name], [name]. 07/25/2021 1:30 PM Medical Record Number: [id number] Patient Account Number: [id number] Date of Birth/Sex: Treating  RN: 20-Jan-1926 (88 y.o. [name] Primary Care Provider: [name] Other Clinician: Referring Provider: Treating Provider/Extender: [name] in Treatment: 75 Constitutional respirations regular, non-labored and within target range for patient.. Cardiovascular 2+ dorsalis pedis/posterior tibialis pulses. Psychiatric pleasant and cooperative. Notes Multiple small scattered open wounds to her lower extremities bilaterally limited to skin breakdown. 3 areas suspicious for malignancy. Electronic Signature(s) Signed: 07/25/2021 2:35:21 PM By: Kalman Shan DO Entered By: Kalman Shan on 07/25/2021 14:30:12 -------------------------------------------------------------------------------- Physician Orders Details Patient Name: Date of Service: [name], [place]. 07/25/2021 1:30 PM Medical Record Number: [id number] Patient Account Number: [id number] Date of Birth/Sex: Treating RN: 12-28-1925 (88 y.o. [name], [name] Primary Care Provider: [name] Other Clinician: Referring Provider: Treating Provider/Extender: [name] in Treatment: 68 Verbal / Phone Orders: No Diagnosis Coding ICD-10 Coding Code Description C44.92 Squamous cell carcinoma of skin, unspecified L97.812 Non-pressure chronic ulcer of other part of right lower leg with fat layer exposed L97.822 Non-pressure chronic ulcer of other part of left lower leg with fat layer exposed I89.0 Lymphedema, not elsewhere classified I87.313 Chronic venous hypertension (idiopathic) with ulcer of bilateral lower extremity Follow-up Appointments ppointment in 1 week. - Dr. Heber [place] and [place], Room 8 08/01/2021 Thursday 130pm Return A ppointment in 2 weeks. - Dr. Heber [place] [facility] [place], Room 8 08/08/2021 Thursday 130pm Return A Other: - Dermatology specialists keep that appt time on weekly. ***when wrapping do not use lotion.*** Bathing/  Shower/ Hygiene May shower with protection but do not get wound dressing(s) wet. - Use cast protector Edema Control - Lymphedema / SCD / Other Lymphedema Pumps. Use Lymphedema pumps on leg(s) 2-3 times a day for 45-60 minutes. If wearing any wraps or hose, do not remove them. Continue exercising as instructed. - use one hour each time twice a day. Elevate legs to the level of the heart or above for 30 minutes daily and/or when sitting, a frequency of: - elevate the legs throughout the day heart level if possible. Avoid standing for long periods of time. Exercise regularly Additional Orders / Instructions Follow Nutritious Diet Wound Treatment Wound #20 - Lower Leg Wound Laterality: Right, Circumferential Cleanser: Soap and Water 1 x Per Week/30 Days Discharge Instructions: May shower and wash wound with dial antibacterial soap and water prior to dressing change. Cleanser: Wound Cleanser 1 x Per Week/30 Days Discharge Instructions: Cleanse the wound with wound cleanser prior to applying a clean dressing using gauze sponges, not tissue or cotton balls. Prim Dressing: KerraCel Ag Gelling Fiber Dressing, 4x5 in (silver alginate) 1 x Per Week/30 Days ary Discharge Instructions: Apply silver alginate to wound bed as instructed Secondary Dressing: ABD Pad, 8x10 1 x Per  Week/30 Days Discharge Instructions: Apply over primary dressing as directed. Secondary Dressing: Woven Gauze Sponge, Non-Sterile 4x4 in 1 x Per Week/30 Days Discharge Instructions: Apply over primary dressing as directed. Compression Wrap: ThreePress (3 layer compression wrap) 1 x Per Week/30 Days Discharge Instructions: Apply three layer compression as directed. Wound #7RR - Lower Leg Wound Laterality: Left, Circumferential Cleanser: Soap and Water 1 x Per Week/30 Days Discharge Instructions: May shower and wash wound with dial antibacterial soap and water prior to dressing change. Cleanser: Wound Cleanser 1 x Per Week/30  Days Discharge Instructions: Cleanse the wound with wound cleanser prior to applying a clean dressing using gauze sponges, not tissue or cotton balls. Prim Dressing: KerraCel Ag Gelling Fiber Dressing, 4x5 in (silver alginate) 1 x Per Week/30 Days ary Discharge Instructions: Apply silver alginate to wound bed as instructed Secondary Dressing: ABD Pad, 8x10 1 x Per Week/30 Days Discharge Instructions: Apply over primary dressing as directed. Secondary Dressing: Woven Gauze Sponge, Non-Sterile 4x4 in 1 x Per Week/30 Days Discharge Instructions: Apply over primary dressing as directed. Compression Wrap: ThreePress (3 layer compression wrap) 1 x Per Week/30 Days Discharge Instructions: Apply three layer compression as directed. Electronic Signature(s) Signed: 07/25/2021 2:35:21 PM By: Kalman Shan DO Entered By: Kalman Shan on 07/25/2021 14:31:16 -------------------------------------------------------------------------------- Problem List Details Patient Name: Date of Service: [name], [place]. 07/25/2021 1:30 PM Medical Record Number: [id number] Patient Account Number: [id number] Date of Birth/Sex: Treating RN: 1926-01-17 (88 y.o. [name], [name] Primary Care Provider: [name] Other Clinician: Referring Provider: Treating Provider/Extender: [name] in Treatment: 25 Active Problems ICD-10 Encounter Code Description Active Date MDM Diagnosis C44.92 Squamous cell carcinoma of skin, unspecified 06/11/2020 No Yes L97.812 Non-pressure chronic ulcer of other part of right lower leg with fat layer 06/11/2020 No Yes exposed L97.822 Non-pressure chronic ulcer of other part of left lower leg with fat layer exposed5/23/2022 No Yes I89.0 Lymphedema, not elsewhere classified 01/31/2021 No Yes I87.313 Chronic venous hypertension (idiopathic) with ulcer of bilateral lower extremity 03/21/2021 No Yes Inactive Problems Resolved Problems Electronic  Signature(s) Signed: 07/25/2021 2:35:21 PM By: Kalman Shan DO Entered By: Kalman Shan on 07/25/2021 14:28:09 -------------------------------------------------------------------------------- Progress Note Details Patient Name: Date of Service: [name], [name] 07/25/2021 1:30 PM Medical Record Number: [id number] Patient Account Number: [id number] Date of Birth/Sex: Treating RN: 1925-10-06 (88 y.o. [name], [name] Primary Care Provider: [name] Other Clinician: Referring Provider: Treating Provider/Extender: [name] in Treatment: 78 Subjective Chief Complaint Information obtained from Patient Bilateral lower extremity wounds that have been biopsied and positive for squamous cell carcinoma Bilateral lower extremity wounds due to chronic venous insufficiency/lymphedema History of Present Illness (HPI) The following HPI elements were documented for the patient's wound: Location: left leg Admission 5/23 [name] is a 88 year old female with a past medical history of 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 She states that the wound on the right has been present for a year. The 1 on the left has opened up 1 month ago. She is followed with oncology for this issue as she had biopsies that showed squamous cell carcinoma. She is also seeing radiation oncology for treatment options. She presents today because she would like for her wounds to be healed by [place]. She currently denies signs of infection. 6/1; patient presents for  1 week follow-up. She states she has tolerated the leg wraps well. She states these do not bother her and is happy to continue with them. She is scheduled to see her oncologist today to go over treatment options for the bilateral lower extremity squamous  cell carcinoma. Radiation is currently not a recommended option. Patient states she overall feels well. 6/22; patient presents for 3-week follow-up. She has tolerated the wraps well until her last wrap where she states they were uncomfortable. She attributes this to the home health nurse. She denies signs of infection. She has started her first treatment of antibody infusions for her Bilateral lower extremity squamous cell carcinoma. She has no complaints today. 7/21; patient presents for 1 month follow-up. Unfortunately she has not had good experience with her wrap changes with home health. She would like to do her own dressing changes. She continues to do her antibody infusions. She denies signs of infection. 7/28; patient presents for 1 week follow-up. At last clinic visit she was switched to daily dressing changes due to issues with the wrap and home health placing them. Unfortunately she has developed weeping to her legs bilaterally. She would like to be placed in wraps today. She would also like to follow with [place] weekly for wrap changes instead of having home health change them. She denies signs of infection. 8/4; patient presents for 1 week follow-up. She has tolerated the Kerlix/Coban wraps well. She no longer has weeping to her legs. She took the wrap off 1 day before coming in to be able to take a shower. She has no issues or complaints today. She denies signs of infection. 8/18; patient presents for follow-up. Patient has tolerated the wraps well. She brought her Velcro compression wraps today. She has no issues or complaints today. She had her chemotherapy infusion yesterday without issues. She denies signs of infection. 8/25; patient presents for follow-up. She used her juxta lite compressions for the past week. It is unclear if she is able to put these on correctly since she states she has a hard time getting them to look right. She reports 2 open wounds. She currently denies signs of  infection. 9/1; patient presents for 1 week follow-up. She has 1 open wound. She tolerated the compression wraps well. She currently denies signs of infection. 9/8; patient presents for 1 week follow-up. She has 2 open wounds 1 on each leg. She has tolerated the compression wrap well. She currently denies signs of infection. 9/15; patient presents for 1 week follow-up. She now has 3 wounds. 2 on the left and 1 on the right. She continues to tolerate the compression wrap well. She currently denies signs of infection. She has obtained furosemide by her primary care physician and would like to discuss when to take this. 9/22; patient presents for 1 week follow-up. She has scattered wounds on her lower extremities bilaterally. She did take furosemide twice in the past week. She does not recall having to urinate more frequently. She tolerated the 3 layer compression well. She denies signs of infection. 10/13; patient has not been here recently because of [place]. Apparently the facility was only putting gauze on her legs. This is a patient I do not normally see. 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 Ronnald Ramp at [facility] dermatology. She is quite convincing that she did not have radiation to her lower legs. It is likely she also has significant  chronic venous insufficiency stasis dermatitis. We have been using silver alginate under kerlix Coban. She has obvious open areas medially on the right and areas on the left. She also has areas of extensive dry flaking adherent areas on the right and to a lesser extent on the left anterior. Nodular areas on the left lateral lower leg left posterior calf and right mid calf medially. 10/21; patient presents for follow-up. She has no issues or complaints today. She has tolerated the 3 layer compression well bilaterally. She denies signs of infection. 10/27; patient presents for follow-up.  She continues to tolerate the 3 layer compression wrap well. She denies signs of infection. 11/30; patient presents for follow-up. She has no issues or complaints today. 11/10; patient arrived in clinic today for nurse visit accompanied by her daughter from [place]. The daughter had multiple questions so we turned this into doctors visit. Apparently after the last time I saw this woman in October she went to see Dr. Ronnald Ramp but he did not take the wraps off. She previously was treated with Cemiplimab for her squamous cell carcinoma. She did not receive radiation to her legs. I had wanted Dr. Ronnald Ramp to look at this because of the exceptionally damaged skin on her lower legs bilaterally. She has odd looking wounds on the left medial lower leg also extending posteriorly which we have not made a lot of progress on. But she also has a raised hyperkeratotic nodules on the right anterior tibial area plaques on the left medial lower thigh. These are not areas that are under compression. We have been using silver alginate on any open areas Liberal TCA under 3 layer compression. 11/17; patient presents for follow-up. She had her wraps taken off yesterday for her dermatology appointment. She reports excessive weeping to her legs bilaterally after the wraps were taken off. She currently denies signs of infection. 12/12/2020 upon evaluation today patient appears to be doing about the same in regard to her wounds. She was actually started on Cipro after having biopsies apparently at her dermatology clinic she does not have the results back from the actual biopsy but the culture did return and apparently they placed her on the Cipro she started that just this morning. Other than that her legs appear to be doing okay at this time. 12/1; patient presents for follow-up. She has no issues or complaints today. She has started Lasix 40 mg daily to help with her bilateral leg swelling. 12/8; patient presents for follow-up. She  has no issues or complaints today. 12/15; patient presents for 1 week follow-up. She has no issues or complaints today. 12/22; patient presents for follow-up. She has no issues or complaints today. 1/5; patient presents for follow-up. She has no issues or complaints today. She has tolerated the compression wraps well. 1/12; patient presents for follow-up. She is tolerated the compression wrap well and has no issues or complaints today. She denies signs of infection. 1/19; patient presents for follow-up. She took the compression wraps off 1 to 2 days ago to take a shower and did not have compression wraps replaced. She reports increased blistering throughout her legs bilaterally. She currently denies signs of infection. 1/26; patient presents for follow-up. She has no issues or complaints today. She tolerated the compression wraps well. She has not heard from oncology to schedule an appointment. 2/2; patient presents for follow-up. She has no issues or complaints today. She continues to tolerate the compression wrap well. 2/16; patient presents for follow-up. She has no  issues or complaints today. 2/23; patient presents for follow-up. She has no issues or complaints today. Tomorrow she has an appointment with oncology to look at the suspicious lesion on her right lower extremity. She currently denies signs of infection. 3/2; patient presents for follow-up. She has been using her juxta light compression to the right lower extremity however there has been increased swelling and opening of wounds throughout the legs with weeping. She currently denies signs of infection. She had no issues with the compression wrap to the left lower extremity. 3/16; patient presents for follow-up. She has no issues or complaints today. She states that she has been restarted on infusions for her squamous cell carcinoma of her legs. She has also been increased on Lasix to 40 mg twice daily. She has noticed a decrease in  swelling to her legs. 3/23; patient presents for follow-up. She starts her second infusion next week for her squamous cell carcinoma lesions to her legs. She reports no issues with the compression wraps. 3/30; patient presents for follow-up. She has no issues or complaints today. 4/6; patient presents for follow-up. Her left lower extremity wounds have healed. She has her juxta light compression with her today. She has no issues or complaints today. 4/13; this is a patient we actually discharged to juxta lite stockings on the left leg the last time she was here. She came in on Monday for [place] to look at the leg and a nurse visit. She had massive swelling and numerous blisters and wound reopening. We put her back in 3 layer compression although I did not generate a note. Silver alginate on the wounded areas. We have also been doing the same on the right. In the meantime she is obtained her external compression pumps that were previously ordered and she started to use them. She has skin cancers that are obvious on the right leg x2 her appointment with Dr. Jarome Matin of dermatology is on May 5.. She is also receiving weekly chemotherapy for recurrent presumably squamous cell carcinomas apparently has had 2 treatments 4/20; patient presents for follow-up. She has her new juxta lite compressions today. She continues to have open wounds to the left lower extremity. She has follow-up with dermatology in 2 weeks. She continues to have IV infusions for her squamous cell carcinoma lesions on her legs. 4/28; the patient has bilateral lower extremity wounds predominantly on the right lateral upper leg, right anterior lower leg which in itself is probably a recurrent cancer as well as the left lateral lower leg. She has recurrent squamous cell carcinoma currently being treated with an IV infusion. She also has scattered nodules on her upper lower legs and thighs I am not certain of all of this is felt to be  malignant as well We have been using silver alginate on the open areas wrapping her legs. She also has her external compression pumps at home but I am not clear that she is going to use these 5/2; patient presents for follow-up. She has not been taking her diuretics as prescribed. She sees Dr. Dian Situ, dermatology at the end of the week. She tolerated the compression wraps well today. She has her juxta lite compressions. 5/8; patient presents for follow-up. She saw Dr. Dian Situ, dermatology and she has nothing to report on. She has been using her juxta lite compression to the right lower extremity. She has tolerated the compression wrap well in the left lower extremity. She has not been taking her diuretic. She is scheduled  to continue her infusions for her squamous cell carcinoma on her legs. 5/18; patient presents for follow-up. We wrapped her in 3 layer compression at last clinic visit. She has no issues or complaints today. 5/22; patient presents for follow-up. She has been wrapped in 3 layer compression bilaterally to her lower extremities over the past week. She has been scratching at the top of the wrap and picking at her skin. This area has increased warmth and erythema. 5/30; patient presents for follow-up. She completed Keflex with resolution of her symptoms to the right lower extremity. She no longer has increased warmth or erythema. She continues her infusions for her squamous cell carcinoma lesions. We continue to wrap her in 3 layer compression with silver alginate. She currently denies signs of infection. 6/8; the patient went to see Dr. Ronnald Ramp of dermatology 2 days ago unfortunately we do not have his notes. They removed her compression wraps of course but did not adequately replace them. She comes in today with 3 wounds on the right lateral upper calf 1 medially below the obvious squamous cell carcinoma there is a large necrotic wound on the left lateral tibial area on the left and several  blisters. The patient remains on Cemiplimad infusions for the squamous cell carcinoma bilaterally. She is not felt to be a good candidate for radiation. Some of the skin changes superiorly in the upper legs bilaterally according to Dr. Ronnald Ramp related to these infusions the same like fleshy nodules to me not blisters 6/13; patient presents for follow-up. We have been using silver alginate under 3 layer compression bilaterally. She has no issues or complaints today. 6/19; patient presents for follow-up. She will see her oncologist in 3 days and next week her dermatologist. She has no new complaints today. We are using silver alginate under 3 layer compression bilaterally. 6/29; patient presents for follow-up. She she saw Dr. Darrin Nipper last week and States she received injections to the lesions on her legs. We will ask for office visit records. She could not be wrapped with compression and has more drainage on exam today. 7/6; patient presents for follow-up. She continues to receive injections to the lesions in her legs by her dermatologist. She had this done earlier today. She has no issues or complaints today. Patient History Information obtained from Patient. Family History Unknown History. Social History Never smoker, Marital Status - Single, Alcohol Use - Never, Drug Use - No History, Caffeine Use - Never. Medical History Eyes Denies history of Cataracts, Optic Neuritis Ear/Nose/Mouth/Throat Denies history of Chronic sinus problems/congestion, Middle ear problems Hematologic/Lymphatic Patient has history of Anemia Denies history of Hemophilia, Human Immunodeficiency Virus, Lymphedema, Sickle Cell Disease Respiratory Denies history of Aspiration, Asthma, Chronic Obstructive Pulmonary Disease (COPD), Pneumothorax, Sleep Apnea, Tuberculosis Cardiovascular Patient has history of Arrhythmia - Atrial Flutter, A fibb, Congestive Heart Failure, Hypertension Denies history of Angina, Coronary  Artery Disease, Deep Vein Thrombosis, Hypotension, Myocardial Infarction, Peripheral Arterial Disease, Peripheral Venous Disease, Phlebitis, Vasculitis Gastrointestinal Patient has history of Colitis Denies history of Cirrhosis , Crohnoos, Hepatitis A, Hepatitis B, Hepatitis C Endocrine Denies history of Type I Diabetes, Type II Diabetes Genitourinary Denies history of End Stage Renal Disease Immunological Denies history of Lupus Erythematosus, Raynaudoos, Scleroderma Integumentary (Skin) Denies history of History of Burn Musculoskeletal Patient has history of Osteoarthritis Denies history of Gout, Rheumatoid Arthritis, Osteomyelitis Neurologic Denies history of Dementia, Neuropathy, Quadriplegia, Paraplegia, Seizure Disorder Oncologic Denies history of Received Chemotherapy, Received Radiation Hospitalization/Surgery History - removal of rod left leg.  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 Vitals Time Taken: 1:40 PM, Height: 65 in, Weight: 163 lbs, BMI: 27.1, Pulse: 71 bpm, Respiratory Rate: 22 breaths/min, Blood Pressure: 130/66 mmHg. Cardiovascular 2+ dorsalis pedis/posterior tibialis pulses. Psychiatric pleasant and cooperative. General Notes: Multiple small scattered open wounds to her lower extremities bilaterally limited to skin breakdown. 3 areas suspicious for malignancy. Integumentary (Hair, Skin) Wound #20 status is Open. Original cause of wound was Gradually Appeared. The date acquired was: 06/25/2021. The wound has been in treatment 4 weeks. The wound is located on the Right,Circumferential Lower Leg. The wound measures 32cm length x 21cm width x 0.1cm depth; 527.788cm^2 area and 52.779cm^3 volume. There is Fat Layer (Subcutaneous Tissue)  exposed. There is no tunneling or undermining noted. There is a medium amount of serosanguineous drainage noted. The wound margin is distinct with the outline attached to the wound base. There is large (67-100%) pink granulation within the wound bed. Wound #7RR status is Open. Original cause of wound was Blister. The date acquired was: 04/20/2020. The wound has been in treatment 58 weeks. The wound is located on the Left,Circumferential Lower Leg. The wound measures 18cm length x 23cm width x 0.1cm depth; 325.155cm^2 area and 32.515cm^3 volume. There is Fat Layer (Subcutaneous Tissue) exposed. There is no tunneling or undermining noted. There is a medium amount of serosanguineous drainage noted. The wound margin is distinct with the outline attached to the wound base. There is large (67-100%) red, pink granulation within the wound bed. There is no necrotic tissue within the wound bed. Assessment Active Problems ICD-10 Squamous cell carcinoma of skin, unspecified Non-pressure chronic ulcer of other part of right lower leg with fat layer exposed Non-pressure chronic ulcer of other part of left lower leg with fat layer exposed Lymphedema, not elsewhere classified Chronic venous hypertension (idiopathic) with ulcer of bilateral lower extremity Patient is currently getting injections by Dr. Volanda Napoleon. She is having 5ooFU injections. She is also continuing Cemiplimab. Wounds are stable. I recommended continuing silver alginate under 3 layer compression bilaterally. Follow-up weekly. Procedures Wound #20 Pre-procedure diagnosis of Wound #20 is a Lymphedema located on the Right,Circumferential Lower Leg . There was a Three Layer Compression Therapy Procedure by Deon Pilling, RN. Post procedure Diagnosis Wound #20: Same as Pre-Procedure Wound #7RR Pre-procedure diagnosis of Wound #7RR is a Malignant Wound located on the Left,Circumferential Lower Leg . There was a Three Layer Compression  Therapy Procedure by Deon Pilling, RN. Post procedure Diagnosis Wound #7RR: Same as Pre-Procedure Plan Follow-up Appointments: Return Appointment in 1 week. - Dr. Heber [place] and [place], Room 8 08/01/2021 Thursday 130pm Return Appointment in 2 weeks. - Dr. Heber [place] and [place], Room 8 08/08/2021 Thursday 130pm Other: - Dermatology specialists keep that appt time on weekly. ***when wrapping do not use lotion.*** Bathing/ Shower/ Hygiene: May shower with protection but do not get wound dressing(s) wet. - Use cast protector Edema Control - Lymphedema / SCD / Other: Lymphedema Pumps. Use Lymphedema pumps on leg(s) 2-3 times a day for 45-60 minutes. If wearing any wraps or hose, do not remove them. Continue exercising as instructed. - use one hour each time twice a day. Elevate legs to the level of the heart or above for 30 minutes daily and/or when sitting, a frequency of: - elevate the legs throughout the day heart level if possible. Avoid standing for long periods of  time. Exercise regularly Additional Orders / Instructions: Follow Nutritious Diet WOUND #20: - Lower Leg Wound Laterality: Right, Circumferential Cleanser: Soap and Water 1 x Per Week/30 Days Discharge Instructions: May shower and wash wound with dial antibacterial soap and water prior to dressing change. Cleanser: Wound Cleanser 1 x Per Week/30 Days Discharge Instructions: Cleanse the wound with wound cleanser prior to applying a clean dressing using gauze sponges, not tissue or cotton balls. Prim Dressing: KerraCel Ag Gelling Fiber Dressing, 4x5 in (silver alginate) 1 x Per Week/30 Days ary Discharge Instructions: Apply silver alginate to wound bed as instructed Secondary Dressing: ABD Pad, 8x10 1 x Per Week/30 Days Discharge Instructions: Apply over primary dressing as directed. Secondary Dressing: Woven Gauze Sponge, Non-Sterile 4x4 in 1 x Per Week/30 Days Discharge Instructions: Apply over primary dressing as directed. Com  pression Wrap: ThreePress (3 layer compression wrap) 1 x Per Week/30 Days Discharge Instructions: Apply three layer compression as directed. WOUND #7RR: - Lower Leg Wound Laterality: Left, Circumferential Cleanser: Soap and Water 1 x Per Week/30 Days Discharge Instructions: May shower and wash wound with dial antibacterial soap and water prior to dressing change. Cleanser: Wound Cleanser 1 x Per Week/30 Days Discharge Instructions: Cleanse the wound with wound cleanser prior to applying a clean dressing using gauze sponges, not tissue or cotton balls. Prim Dressing: KerraCel Ag Gelling Fiber Dressing, 4x5 in (silver alginate) 1 x Per Week/30 Days ary Discharge Instructions: Apply silver alginate to wound bed as instructed Secondary Dressing: ABD Pad, 8x10 1 x Per Week/30 Days Discharge Instructions: Apply over primary dressing as directed. Secondary Dressing: Woven Gauze Sponge, Non-Sterile 4x4 in 1 x Per Week/30 Days Discharge Instructions: Apply over primary dressing as directed. Com pression Wrap: ThreePress (3 layer compression wrap) 1 x Per Week/30 Days Discharge Instructions: Apply three layer compression as directed. 1. Silver alginate under 3 layer compression bilaterally 2. Follow-up in 1 week Electronic Signature(s) Signed: 07/25/2021 2:35:21 PM By: Kalman Shan DO Entered By: Kalman Shan on 07/25/2021 14:34:30 -------------------------------------------------------------------------------- HxROS Details Patient Name: Date of Service: [name], [place]. 07/25/2021 1:30 PM Medical Record Number: [id number] Patient Account Number: [id number] Date of Birth/Sex: Treating RN: Jul 09, 1925 (88 y.o. [name] Primary Care Provider: [name] Other Clinician: Referring Provider: Treating Provider/Extender: [name] in Treatment: 74 Information Obtained From Patient Eyes Medical History: Negative for: Cataracts; Optic  Neuritis Ear/Nose/Mouth/Throat Medical History: Negative for: Chronic sinus problems/congestion; Middle ear problems Hematologic/Lymphatic Medical History: Positive for: Anemia Negative for: Hemophilia; Human Immunodeficiency Virus; Lymphedema; Sickle Cell Disease Respiratory Medical History: Negative for: Aspiration; Asthma; Chronic Obstructive Pulmonary Disease (COPD); Pneumothorax; Sleep Apnea; Tuberculosis Cardiovascular Medical History: Positive for: Arrhythmia - Atrial Flutter, A fibb; Congestive Heart Failure; Hypertension Negative for: Angina; Coronary Artery Disease; Deep Vein Thrombosis; Hypotension; Myocardial Infarction; Peripheral Arterial Disease; Peripheral Venous Disease; Phlebitis; Vasculitis Past Medical History Notes: hyperlipidemia Gastrointestinal Medical History: Positive for: Colitis Negative for: Cirrhosis ; Crohns; Hepatitis A; Hepatitis B; Hepatitis C Endocrine Medical History: Negative for: Type I Diabetes; Type II Diabetes Past Medical History Notes: hypothyroidism Genitourinary Medical History: Negative for: End Stage Renal Disease Immunological Medical History: Negative for: Lupus Erythematosus; Raynauds; Scleroderma Integumentary (Skin) Medical History: Negative for: History of Burn Musculoskeletal Medical History: Positive for: Osteoarthritis Negative for: Gout; Rheumatoid Arthritis; Osteomyelitis Neurologic Medical History: Negative for: Dementia; Neuropathy; Quadriplegia; Paraplegia; Seizure Disorder Past Medical History Notes: lumbar spindylolysis Oncologic Medical History: Negative for: Received Chemotherapy; Received Radiation Past Medical History Notes: BLE squamous  ceel carcionoma Immunizations Pneumococcal Vaccine: Received Pneumococcal Vaccination: Yes Received Pneumococcal Vaccination On or After 60th Birthday: No Implantable Devices None Hospitalization / Surgery History Type of Hospitalization/Surgery removal of  rod left leg Family and Social History Unknown History: Yes; Never smoker; Marital Status - Single; Alcohol Use: Never; Drug Use: No History; Caffeine Use: Never; Financial Concerns: No; Food, Clothing or Shelter Needs: No; Support System Lacking: No; Transportation Concerns: No Electronic Signature(s) Signed: 07/25/2021 2:35:21 PM By: Kalman Shan DO Signed: 07/25/2021 4:18:35 PM By: Deon Pilling RN, BSN Entered By: Kalman Shan on 07/25/2021 14:29:26 -------------------------------------------------------------------------------- SuperBill Details Patient Name: Date of Service: [name], [name] 07/25/2021 Medical Record Number: [id number] Patient Account Number: [id number] Date of Birth/Sex: Treating RN: 05-May-1925 (88 y.o. [name], [name] Primary Care Provider: [name] Other Clinician: Referring Provider: Treating Provider/Extender: [name] in Treatment: 35 Diagnosis Coding ICD-10 Codes Code Description C44.92 Squamous cell carcinoma of skin, unspecified L97.812 Non-pressure chronic ulcer of other part of right lower leg with fat layer exposed L97.822 Non-pressure chronic ulcer of other part of left lower leg with fat layer exposed I89.0 Lymphedema, not elsewhere classified I87.313 Chronic venous hypertension (idiopathic) with ulcer of bilateral lower extremity Facility Procedures CPT4: Code [id number] Description: 81 BILATERAL: Application of multi-layer venous compression system; leg (below knee), including ankle and t. Modifier: Quantity: 1 Physician Procedures : CPT4 Code Description Modifier [id number] - WC PHYS LEVEL 3 - EST PT ICD-10 Diagnosis Description D17.616 Non-pressure chronic ulcer of other part of right lower leg with fat layer exposed L97.822 Non-pressure chronic ulcer of other part of left  lower leg with fat layer exposed C44.92 Squamous cell carcinoma of skin, unspecified I87.313 Chronic venous  hypertension (idiopathic) with ulcer of bilateral lower extremity Quantity: 1 Electronic Signature(s) Signed: 07/25/2021 2:35:21 PM By: Kalman Shan DO Entered By: Kalman Shan on 07/25/2021 14:34:55
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
## 2021-07-31 ENCOUNTER — Inpatient Hospital Stay (HOSPITAL_BASED_OUTPATIENT_CLINIC_OR_DEPARTMENT_OTHER): Payer: Medicare Other | Admitting: Oncology

## 2021-07-31 ENCOUNTER — Other Ambulatory Visit: Payer: Self-pay

## 2021-07-31 ENCOUNTER — Inpatient Hospital Stay: Payer: Medicare Other

## 2021-07-31 ENCOUNTER — Inpatient Hospital Stay: Payer: Medicare Other | Attending: Oncology

## 2021-07-31 VITALS — BP 99/58 | HR 86 | Temp 97.7°F | Resp 16 | Ht 67.0 in | Wt 155.3 lb

## 2021-07-31 VITALS — BP 108/68 | HR 86 | Temp 98.2°F | Resp 16

## 2021-07-31 DIAGNOSIS — Z79899 Other long term (current) drug therapy: Secondary | ICD-10-CM | POA: Insufficient documentation

## 2021-07-31 DIAGNOSIS — C44721 Squamous cell carcinoma of skin of unspecified lower limb, including hip: Secondary | ICD-10-CM

## 2021-07-31 DIAGNOSIS — L03116 Cellulitis of left lower limb: Secondary | ICD-10-CM | POA: Insufficient documentation

## 2021-07-31 DIAGNOSIS — L03115 Cellulitis of right lower limb: Secondary | ICD-10-CM | POA: Diagnosis not present

## 2021-07-31 DIAGNOSIS — Z5112 Encounter for antineoplastic immunotherapy: Secondary | ICD-10-CM | POA: Insufficient documentation

## 2021-07-31 DIAGNOSIS — C7652 Malignant neoplasm of left lower limb: Secondary | ICD-10-CM | POA: Insufficient documentation

## 2021-07-31 DIAGNOSIS — C4490 Unspecified malignant neoplasm of skin, unspecified: Secondary | ICD-10-CM

## 2021-07-31 DIAGNOSIS — E039 Hypothyroidism, unspecified: Secondary | ICD-10-CM

## 2021-07-31 LAB — CBC WITH DIFFERENTIAL (CANCER CENTER ONLY)
Abs Immature Granulocytes: 0.01 10*3/uL (ref 0.00–0.07)
Basophils Absolute: 0.1 10*3/uL (ref 0.0–0.1)
Basophils Relative: 1 %
Eosinophils Absolute: 0.2 10*3/uL (ref 0.0–0.5)
Eosinophils Relative: 4 %
HCT: 30.1 % — ABNORMAL LOW (ref 36.0–46.0)
Hemoglobin: 9.6 g/dL — ABNORMAL LOW (ref 12.0–15.0)
Immature Granulocytes: 0 %
Lymphocytes Relative: 14 %
Lymphs Abs: 0.9 10*3/uL (ref 0.7–4.0)
MCH: 33.6 pg (ref 26.0–34.0)
MCHC: 31.9 g/dL (ref 30.0–36.0)
MCV: 105.2 fL — ABNORMAL HIGH (ref 80.0–100.0)
Monocytes Absolute: 0.8 10*3/uL (ref 0.1–1.0)
Monocytes Relative: 14 %
Neutro Abs: 4.1 10*3/uL (ref 1.7–7.7)
Neutrophils Relative %: 67 %
Platelet Count: 311 10*3/uL (ref 150–400)
RBC: 2.86 MIL/uL — ABNORMAL LOW (ref 3.87–5.11)
RDW: 13.4 % (ref 11.5–15.5)
WBC Count: 6.2 10*3/uL (ref 4.0–10.5)
nRBC: 0 % (ref 0.0–0.2)

## 2021-07-31 LAB — CMP (CANCER CENTER ONLY)
ALT: 10 U/L (ref 0–44)
AST: 25 U/L (ref 15–41)
Albumin: 3.1 g/dL — ABNORMAL LOW (ref 3.5–5.0)
Alkaline Phosphatase: 48 U/L (ref 38–126)
Anion gap: 6 (ref 5–15)
BUN: 15 mg/dL (ref 8–23)
CO2: 26 mmol/L (ref 22–32)
Calcium: 9.1 mg/dL (ref 8.9–10.3)
Chloride: 103 mmol/L (ref 98–111)
Creatinine: 1.2 mg/dL — ABNORMAL HIGH (ref 0.44–1.00)
GFR, Estimated: 42 mL/min — ABNORMAL LOW (ref >60)
Glucose, Bld: 131 mg/dL — ABNORMAL HIGH (ref 70–99)
Potassium: 4.4 mmol/L (ref 3.5–5.1)
Sodium: 135 mmol/L (ref 135–145)
Total Bilirubin: 0.8 mg/dL (ref 0.3–1.2)
Total Protein: 7 g/dL (ref 6.5–8.1)

## 2021-07-31 LAB — TSH: TSH: 0.949 u[IU]/mL (ref 0.350–4.500)

## 2021-07-31 MED ORDER — SODIUM CHLORIDE 0.9 % IV SOLN
350.0000 mg | Freq: Once | INTRAVENOUS | Status: AC
  Administered 2021-07-31: 350 mg via INTRAVENOUS
  Filled 2021-07-31: qty 7

## 2021-07-31 MED ORDER — SODIUM CHLORIDE 0.9 % IV SOLN: Freq: Once | INTRAVENOUS | Status: AC

## 2021-07-31 NOTE — Progress Notes (Signed)
Hematology and Oncology Follow Up Visit  [name] [id number] 1925/05/26 88 y.o. 07/31/2021 11:26 AM [name], [name] Lacks, MDPlotnikov, [name] Lacks, MD   Principle Diagnosis: 88 year old woman with lower extremity squamous cell carcinoma diagnosed in June of 2022.  She developed locally advanced, recurrent in March 2023.   Prior Therapy:  She received Libtayo for 5 cycles concluded in September 2022.  Current therapy:  Libtayo 350 mg total dose restarted on March 26, 2021.  He is here for cycle 7 of therapy.  Interim History: [name] returns today for a follow-up.  Since last visit, she reports no major changes in her health.  She continues to tolerate this treatment without any complaints at this time.  She is currently receiving topical treatment with 5-FU which described as painful in her lower extremities.  She also was on oral antibiotics for cellulitis.     Medications: Updated on review. Current Outpatient Medications  Medication Sig Dispense Refill   acetaminophen (TYLENOL) 500 MG tablet Take 500 mg by mouth daily as needed for mild pain.      apixaban (ELIQUIS) 5 MG TABS tablet Take 1 tablet (5 mg total) by mouth 2 (two) times daily. NEEDS CARDIOLOGY APPOINTMENT FOR ELIQUIS REFILLS, PLEASE CALL OFFICE 60 tablet 0   B Complex-C (B-COMPLEX WITH VITAMIN C) tablet Take 1 tablet by mouth daily.     busPIRone (BUSPAR) 15 MG tablet TAKE 1 TABLET BY MOUTH TWICE DAILY 180 tablet 3   Cholecalciferol (VITAMIN D3) 1000 UNITS tablet Take 1,000 Units by mouth daily.     colestipol (COLESTID) 1 g tablet Take 1 tablet (1 g total) by mouth in the morning. 60 tablet 12   diltiazem (CARDIZEM CD) 120 MG 24 hr capsule TAKE 1 CAPSULE BY MOUTH ONCE DAILY 30 capsule 11   doxycycline (VIBRA-TABS) 100 MG tablet TAKE 1 TABLET BY MOUTH ONCE DAILY 30 tablet 11   famotidine (PEPCID) 20 MG tablet Take 20 mg by mouth daily.     furosemide (LASIX) 40 MG tablet Take 1 tablet (40 mg total) by mouth 2  (two) times daily. 60 tablet 5   gabapentin (NEURONTIN) 100 MG capsule TAKE 1 CAPSULE BY MOUTH EVERY NIGHT AT BEDTIME *DO NOT CRUSH OR CHEW* **NOTE NEW INSTRUCTIONS* 30 capsule 5   hydrocortisone 2.5 % lotion Apply topically 3 (three) times daily. 240 mL 2   levothyroxine (SYNTHROID) 25 MCG tablet TAKE 1 TABLET BY MOUTH ONCE DAILY FOR THYROID *TAKE ON AN EMPTY STOMACH* 30 tablet 11   linaclotide (LINZESS) 290 MCG CAPS capsule TAKE 1 TABLET BY MOUTH ONCE DAILY *TAKE 30 MINUTES PRIOR TO FIRST MEAL* *TAKE ON AN EMPTY STOMACH* *DO NOT CRUSH OR CHEW* 30 capsule 5   loperamide (IMODIUM) 2 MG capsule Take 1 capsule (2 mg total) by mouth as needed for diarrhea or loose stools. Take 1 tab with first loose stool then one tab after each loose stool for maximum dose of 8 tabs daily. Pt to call the office if need for greater then 8 tabs a day 60 capsule 0   methylPREDNISolone (MEDROL DOSEPAK) 4 MG TBPK tablet As directed 21 tablet 0   metoprolol succinate (TOPROL-XL) 50 MG 24 hr tablet TAKE 1/2 TABLET = 25 MG BY MOUTH TWICE DAILY. TAKE WITH OR IMMEDIATELY FOLLOWING A MEAL 30 tablet 10   Multiple Vitamins-Minerals (MULTIVITAMIN WITH MINERALS) tablet Take 1 tablet by mouth daily.     polyethylene glycol (MIRALAX) 17 g packet Take 17 g by mouth daily. [place]  each 0   potassium chloride SA (KLOR-CON M) 20 MEQ tablet TAKE 1 TABLET BY MOUTH ONCE DAILY  ALONG W/ FUROSEMIDE AS NEED *TAKE WITH FOOD* *DO NOT CRUSH OR CHEW* *MAY DISSOLVE* 30 tablet 11   potassium chloride SA (KLOR-CON M) 20 MEQ tablet Take 1 tablet (20 mEq total) by mouth daily. Take along w/ Furosemide as need 30 tablet 3   prochlorperazine (COMPAZINE) 5 MG tablet Take 1 tablet (5 mg total) by mouth every 8 (eight) hours as needed for nausea or vomiting. 20 tablet 1   temazepam (RESTORIL) 15 MG capsule TAKE 1 CAPSULE BY MOUTH EVERY NIGHT AT BEDTIME AS NEEDED FOR SLEEP 30 capsule 3   No current facility-administered medications for this visit.     Allergies:   Allergies  Allergen Reactions   Zosyn [Piperacillin Sod-Tazobactam So] Hives, Itching, Rash and Other (See Comments)    Morbilliform eruptions with itching- looks like Measles   Atorvastatin     myalgias   Atenolol Nausea And Vomiting   Clarithromycin Nausea And Vomiting   Codeine Sulfate Nausea Only   Levaquin [Levofloxacin] Nausea Only   Macrodantin Other (See Comments)    UNSPECIFIED    Oxycodone-Acetaminophen Nausea And Vomiting      Physical Exam:    Blood pressure (!) 99/58, pulse 86, temperature 97.7 F (36.5 C), temperature source Temporal, resp. rate 16, height '5\' 7"'$  (1.702 m), weight 155 lb 4.8 oz (70.4 kg), SpO2 98 %.   ECOG: 2    General appearance: Comfortable appearing without any discomfort Head: Normocephalic without any trauma Oropharynx: Mucous membranes are moist and pink without any thrush or ulcers. Eyes: Pupils are equal and round reactive to light. Lymph nodes: No cervical, supraclavicular, inguinal or axillary lymphadenopathy.   Heart:regular rate and rhythm.  S1 and S2 without leg edema. Lung: Clear without any rhonchi or wheezes.  No dullness to percussion. Abdomin: Soft, nontender, nondistended with good bowel sounds.  No hepatosplenomegaly. Musculoskeletal: No joint deformity or effusion.  Full range of motion noted. Neurological: No deficits noted on motor, sensory and deep tendon reflex exam. Skin: Decrease in the size of protrusions in her arms.  Lower extremities show no tenderness or erythema.      Lab Results: Lab Results  Component Value Date   WBC 6.4 07/11/2021   HGB 10.9 (L) 07/11/2021   HCT 33.8 (L) 07/11/2021   MCV 105.6 (H) 07/11/2021   PLT 319 07/11/2021     Chemistry      Component Value Date/Time   NA 134 (L) 07/11/2021 1135   NA 134 08/11/2018 1425   NA 137 07/28/2016 1334   K 4.9 07/11/2021 1135   K 3.8 07/28/2016 1334   CL 98 07/11/2021 1135   CO2 30 07/11/2021 1135   CO2 28 07/28/2016 1334   BUN 25 (H)  07/11/2021 1135   BUN 20 08/11/2018 1425   BUN 9.5 07/28/2016 1334   CREATININE 1.51 (H) 07/11/2021 1135   CREATININE 1.0 07/28/2016 1334      Component Value Date/Time   CALCIUM 9.5 07/11/2021 1135   CALCIUM 9.6 07/28/2016 1334   ALKPHOS 53 07/11/2021 1135   ALKPHOS 96 07/28/2016 1334   AST 25 07/11/2021 1135   AST 21 07/28/2016 1334   ALT 9 07/11/2021 1135   ALT 24 07/28/2016 1334   BILITOT 0.8 07/11/2021 1135   BILITOT 1.21 (H) 07/28/2016 1334         Impression and Plan:  88 year old with:  1.  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  She developed recurrent disease in March 2023.  Her overall disease status was updated at this time and treatment choices were discussed.  I have recommended continuing Libtayo given her overall disease response and tolerance.  Complications that include GI toxicity, dermatological toxicity as well as autoimmune complications.   2.  Breast cancer: No evidence of relapse at this time.  3.  Nonhealing wounds in the lower extremities: She continues to follow with the wound clinic at this time.      4.  Follow-up: In 3 weeks for the next cycle of therapy.      30 minutes were dedicated to this encounter.  The time was spent on reviewing laboratory data, disease status update and outlining future plan of care discussion.   Zola Button, MD 7/12/202311:26 AM

## 2021-07-31 NOTE — Patient Instructions (Signed)
[facility] MEDICAL [facility]COLOGY  Discharge Instructions: Thank you for choosing [facility] to provide your oncology and hematology care.   If you have a lab appointment with the Cancer Center, please go directly to the Cancer Center and check in at the registration area.   Wear comfortable clothing and clothing appropriate for easy access to any Portacath or PICC line.   We strive to give you quality time with your provider. You may need to reschedule your appointment if you arrive late (15 or more minutes).  Arriving late affects you and other patients whose appointments are after yours.  Also, if you miss three or more appointments without notifying the office, you may be dismissed from the clinic at the provider's discretion.      For prescription refill requests, have your pharmacy contact our office and allow 72 hours for refills to be completed.    Today you received the following chemotherapy and/or immunotherapy agents: Libtayo      To help prevent nausea and vomiting after your treatment, we encourage you to take your nausea medication as directed.  BELOW ARE SYMPTOMS THAT SHOULD BE REPORTED IMMEDIATELY: *FEVER GREATER THAN 100.4 F (38 C) OR HIGHER *CHILLS OR SWEATING *NAUSEA AND VOMITING THAT IS NOT CONTROLLED WITH YOUR NAUSEA MEDICATION *UNUSUAL SHORTNESS OF BREATH *UNUSUAL BRUISING OR BLEEDING *URINARY PROBLEMS (pain or burning when urinating, or frequent urination) *BOWEL PROBLEMS (unusual diarrhea, constipation, pain near the anus) TENDERNESS IN MOUTH AND THROAT WITH OR WITHOUT PRESENCE OF ULCERS (sore throat, sores in mouth, or a toothache) UNUSUAL RASH, SWELLING OR PAIN  UNUSUAL VAGINAL DISCHARGE OR ITCHING   Items with * indicate a potential emergency and should be followed up as soon as possible or go to the Emergency Department if any problems should occur.  Please show the CHEMOTHERAPY ALERT CARD or IMMUNOTHERAPY ALERT CARD at check-in to  the Emergency Department and triage nurse.  Should you have questions after your visit or need to cancel or reschedule your appointment, please contact [facility]  Dept: [phone]  and follow the prompts.  Office hours are 8:00 a.m. to 4:30 p.m. Monday - Friday. Please note that voicemails left after 4:00 p.m. may not be returned until the following business day.  We are closed weekends and major holidays. You have access to a nurse at all times for urgent questions. Please call the main number to the clinic Dept: [phone] and follow the prompts.   For any non-urgent questions, you may also contact your provider using MyChart. We now offer e-Visits for anyone 18 and older to request care online for non-urgent symptoms. For details visit [email or web address].   Also download the MyChart app! Go to the app store, search "MyChart", open the app, select [facility], and log in with your MyChart username and password.  Masks are optional in the cancer centers. If you would like for your care team to wear a mask while they are taking care of you, please let them know. For doctor visits, patients may have with them one support person who is at least 86 years old. At this time, visitors are not allowed in the infusion area. 

## 2021-08-01 ENCOUNTER — Encounter (HOSPITAL_BASED_OUTPATIENT_CLINIC_OR_DEPARTMENT_OTHER): Payer: Medicare Other | Admitting: Internal Medicine

## 2021-08-01 DIAGNOSIS — C44622 Squamous cell carcinoma of skin of right upper limb, including shoulder: Secondary | ICD-10-CM | POA: Diagnosis not present

## 2021-08-01 DIAGNOSIS — L97822 Non-pressure chronic ulcer of other part of left lower leg with fat layer exposed: Secondary | ICD-10-CM | POA: Diagnosis not present

## 2021-08-01 DIAGNOSIS — L97909 Non-pressure chronic ulcer of unspecified part of unspecified lower leg with unspecified severity: Secondary | ICD-10-CM | POA: Diagnosis not present

## 2021-08-01 DIAGNOSIS — I87313 Chronic venous hypertension (idiopathic) with ulcer of bilateral lower extremity: Secondary | ICD-10-CM | POA: Diagnosis not present

## 2021-08-01 DIAGNOSIS — C44721 Squamous cell carcinoma of skin of unspecified lower limb, including hip: Secondary | ICD-10-CM | POA: Diagnosis not present

## 2021-08-01 DIAGNOSIS — E039 Hypothyroidism, unspecified: Secondary | ICD-10-CM | POA: Diagnosis not present

## 2021-08-01 DIAGNOSIS — C4492 Squamous cell carcinoma of skin, unspecified: Secondary | ICD-10-CM | POA: Diagnosis not present

## 2021-08-01 DIAGNOSIS — L97812 Non-pressure chronic ulcer of other part of right lower leg with fat layer exposed: Secondary | ICD-10-CM | POA: Diagnosis not present

## 2021-08-01 DIAGNOSIS — I890 Lymphedema, not elsewhere classified: Secondary | ICD-10-CM | POA: Diagnosis not present

## 2021-08-01 DIAGNOSIS — L309 Dermatitis, unspecified: Secondary | ICD-10-CM | POA: Diagnosis not present

## 2021-08-01 NOTE — Progress Notes (Signed)
[name] ([id number]) , Visit Report for 08/01/2021 Arrival Information Details Patient Name: Date of Service: [name], [place]. 08/01/2021 1:30 PM Medical Record Number: [id number] Patient Account Number: [id number] Date of Birth/Sex: Treating RN: 1925-06-13 (88 y.o. [name], [name] Primary Care [name]: [name] Other Clinician: Referring [name]: Treating [name]/[name]: [name] in Treatment: 36 Visit Information History Since Last Visit Added or deleted any medications: No Patient Arrived: Walker Any new allergies or adverse reactions: No Arrival Time: 13:40 Had a fall or experienced change in No Accompanied By: self activities of daily living that may affect Transfer Assistance: None risk of falls: Patient Identification Verified: Yes Signs or symptoms of abuse/neglect since last visito No Secondary Verification Process Completed: Yes Hospitalized since last visit: No Patient Requires Transmission-Based No Implantable device outside of the clinic excluding No Precautions: cellular tissue based products placed in the center Patient Has Alerts: Yes since last visit: Patient Alerts: R ABI = Non compressible Has Dressing in Place as Prescribed: Yes L ABI = Non compressible Has Compression in Place as Prescribed: Yes Pain Present Now: No Electronic Signature(s) Signed: 08/01/2021 4:34:49 PM By: [name] Pilling RN, BSN Entered By: [name] on 08/01/2021 13:58:21 -------------------------------------------------------------------------------- Compression Therapy Details Patient Name: Date of Service: [name], [place]. 08/01/2021 1:30 PM Medical Record Number: [id number] Patient Account Number: [id number] Date of Birth/Sex: Treating RN: 02/27/1925 (88 y.o. [name] Primary Care [name]: [name] Other Clinician: Referring [name]: Treating [name]/[name]: [name] in Treatment: 49 Compression Therapy Performed for Wound Assessment: Wound #20 Bell,Circumferential Lower Leg Performed By: Clinician [name] Pilling, RN Compression Type: Three Layer Post Procedure Diagnosis Same as Pre-procedure Electronic Signature(s) Signed: 08/01/2021 4:34:49 PM By: [name] Pilling RN, BSN Entered By: [name] on 08/01/2021 14:12:08 -------------------------------------------------------------------------------- Compression Therapy Details Patient Name: Date of Service: [name], [name]. 08/01/2021 1:30 PM Medical Record Number: [id number] Patient Account Number: [id number] Date of Birth/Sex: Treating RN: 1925/02/03 (88 y.o. [name] Primary Care [name]: [name] Other Clinician: Referring [name]: Treating [name]/[name]: [name] in Treatment: 4 Compression Therapy Performed for Wound Assessment: Wound #7RR Left,Circumferential Lower Leg Performed By: Clinician [name] Pilling, RN Compression Type: Three Layer Post Procedure Diagnosis Same as Pre-procedure Electronic Signature(s) Signed: 08/01/2021 4:34:49 PM By: [name] Pilling RN, BSN Entered By: [name] on 08/01/2021 14:12:08 -------------------------------------------------------------------------------- Encounter Discharge Information Details Patient Name: Date of Service: [name], [name]. 08/01/2021 1:30 PM Medical Record Number: [id number] Patient Account Number: [id number] Date of Birth/Sex: Treating RN: 1925/04/18 (88 y.o. [name] Primary Care [name]: [name] Other Clinician: Referring [name]: Treating [name]/[name]: [name] in Treatment: 73 Encounter Discharge Information Items Discharge Condition: Stable Ambulatory Status: Walker Discharge Destination: Home Transportation: Private Auto Accompanied By: self Schedule Follow-up Appointment:  Yes Clinical Summary of Care: Electronic Signature(s) Signed: 08/01/2021 4:34:49 PM By: [name] Pilling RN, BSN Entered By: [name] on 08/01/2021 14:15:20 -------------------------------------------------------------------------------- Lower Extremity Assessment Details Patient Name: Date of Service: [name], [place]. 08/01/2021 1:30 PM Medical Record Number: [id number] Patient Account Number: [id number] Date of Birth/Sex: Treating RN: 1925-08-24 (88 y.o. [name] Primary Care [name]: [name] Other Clinician: Referring [name]: Treating [name]/[name]: [name] in Treatment: 36 Edema Assessment Assessed: [Left: Yes] [Bell: Yes] Edema: [Left: Yes] [Bell: Yes] Calf Left: Bell: Point of Measurement: 30 cm From Medial Instep 32 cm  32 cm Ankle Left: Bell: Point of Measurement: 9 cm From Medial Instep 22 cm 22 cm Electronic Signature(s) Signed: 08/01/2021 4:34:49 PM By: [name] Pilling RN, BSN Entered By: [name] on 08/01/2021 13:59:21 -------------------------------------------------------------------------------- Multi Wound Chart Details Patient Name: Date of Service: [name], [name] 08/01/2021 1:30 PM Medical Record Number: [id number] Patient Account Number: [id number] Date of Birth/Sex: Treating RN: [birth year] (88 y.o. [name], [name] Primary Care [name]: [name] Other Clinician: Referring [name]: Treating [name]: [name] in Treatment: 50 Vital Signs Height(in): 65 Pulse(bpm): 51 Weight(lbs): 163 Blood Pressure(mmHg): 148/83 Body Mass Index(BMI): 27.1 Temperature(F): 97.1 Respiratory Rate(breaths/min): 20 Photos: [N/A:N/A] Bell, Circumferential Lower Leg Left, Circumferential Lower Leg N/A Wound Location: Gradually Appeared Blister N/A Wounding Event: Lymphedema Malignant Wound N/A Primary Etiology: Anemia, Arrhythmia,  Congestive Heart Anemia, Arrhythmia, Congestive Heart N/A Comorbid History: Failure, Hypertension, Colitis, Failure, Hypertension, Colitis, Osteoarthritis Osteoarthritis 06/25/2021 04/20/2020 N/A Date Acquired: 5 47 N/A Weeks of Treatment: Open Open N/A Wound Status: No Yes N/A Wound Recurrence: Yes Yes N/A Clustered Wound: 15 15 N/A Clustered Quantity: 29x23x0.1 18.5x17.5x0.1 N/A Measurements L x W x D (cm) 523.861 254.273 N/A A (cm) : rea 52.386 25.427 N/A Volume (cm) : -31.80% -8179.80% N/A % Reduction in Area: -31.80% -8182.40% N/A % Reduction in Volume: Full Thickness Without Exposed Full Thickness Without Exposed N/A Classification: Support Structures Support Structures Medium Medium N/A Exudate Amount: Serosanguineous Serosanguineous N/A Exudate Type: red, brown red, brown N/A Exudate Color: Distinct, outline attached Distinct, outline attached N/A Wound Margin: Large (67-100%) Large (67-100%) N/A Granulation Amount: Pink Red, Pink N/A Granulation Quality: N/A None Present (0%) N/A Necrotic Amount: Fat Layer (Subcutaneous Tissue): Yes Fat Layer (Subcutaneous Tissue): Yes N/A Exposed Structures: Fascia: No Fascia: No Tendon: No Tendon: No Muscle: No Muscle: No Joint: No Joint: No Bone: No Bone: No Small (1-33%) Small (1-33%) N/A Epithelialization: Compression Therapy Compression Therapy N/A Procedures Performed: Treatment Notes Wound #20 (Lower Leg) Wound Laterality: Bell, Circumferential Cleanser Soap and Water Discharge Instruction: May shower and wash wound with dial antibacterial soap and water prior to dressing change. Wound Cleanser Discharge Instruction: Cleanse the wound with wound cleanser prior to applying a clean dressing using gauze sponges, not tissue or cotton balls. Peri-Wound Care Topical Primary Dressing KerraCel Ag Gelling Fiber Dressing, 4x5 in (silver alginate) Discharge Instruction: Apply silver alginate to wound bed as  instructed Secondary Dressing ABD Pad, 8x10 Discharge Instruction: Apply over primary dressing as directed. Woven Gauze Sponge, Non-Sterile 4x4 in Discharge Instruction: Apply over primary dressing as directed. Secured With Compression Wrap ThreePress (3 layer compression wrap) Discharge Instruction: Apply three layer compression as directed. Compression Stockings Add-Ons Wound #7RR (Lower Leg) Wound Laterality: Left, Circumferential Cleanser Soap and Water Discharge Instruction: May shower and wash wound with dial antibacterial soap and water prior to dressing change. Wound Cleanser Discharge Instruction: Cleanse the wound with wound cleanser prior to applying a clean dressing using gauze sponges, not tissue or cotton balls. Peri-Wound Care Topical Primary Dressing KerraCel Ag Gelling Fiber Dressing, 4x5 in (silver alginate) Discharge Instruction: Apply silver alginate to wound bed as instructed Secondary Dressing ABD Pad, 8x10 Discharge Instruction: Apply over primary dressing as directed. Woven Gauze Sponge, Non-Sterile 4x4 in Discharge Instruction: Apply over primary dressing as directed. Secured With Compression Wrap ThreePress (3 layer compression wrap) Discharge Instruction: Apply three layer compression as directed. Compression Stockings Add-Ons Electronic Signature(s) Signed: 08/01/2021 3:00:51 PM By: Kalman Shan DO Signed: 08/01/2021 4:34:49 PM By: [name] Pilling RN,  BSN Entered By: Kalman Shan on 08/01/2021 14:40:45 -------------------------------------------------------------------------------- Multi-Disciplinary Care Plan Details Patient Name: Date of Service: [name], [place]. 08/01/2021 1:30 PM Medical Record Number: [id number] Patient Account Number: [id number] Date of Birth/Sex: Treating RN: 04-15-1925 (88 y.o. [name] Primary Care Britini Garcilazo: [name] Other Clinician: Referring Laren Whaling: Treating Keiandra Sullenger/[name]: [name] in Treatment: 55 Multidisciplinary Care Plan reviewed with physician Active Inactive Venous Leg Ulcer Nursing Diagnoses: Knowledge deficit related to disease process and management Potential for venous Insuffiency (use before diagnosis confirmed) Goals: Patient will maintain optimal edema control Date Initiated: 11/01/2020 Target Resolution Date: 08/16/2021 Goal Status: Active Interventions: Assess peripheral edema status every visit. Compression as ordered Provide education on venous insufficiency Treatment Activities: Therapeutic compression applied : 11/01/2020 Notes: 12/27/20: Edema control ongoing 01/24/21: Edema control continues, using 3 layer compression Wound/Skin Impairment Nursing Diagnoses: Impaired tissue integrity Knowledge deficit related to ulceration/compromised skin integrity Goals: Patient/caregiver will verbalize understanding of skin care regimen Date Initiated: 06/11/2020 Target Resolution Date: 08/16/2021 Goal Status: Active Interventions: Assess patient/caregiver ability to obtain necessary supplies Assess patient/caregiver ability to perform ulcer/skin care regimen upon admission and as needed Assess ulceration(s) every visit Provide education on ulcer and skin care Notes: 10/11/20: Wound care regimen ongoing. 12/27/20: Wound care continues Electronic Signature(s) Signed: 08/01/2021 4:34:49 PM By: [name] Pilling RN, BSN Entered By: [name] on 08/01/2021 14:01:10 -------------------------------------------------------------------------------- Pain Assessment Details Patient Name: Date of Service: [name], [place]. 08/01/2021 1:30 PM Medical Record Number: [id number] Patient Account Number: [id number] Date of Birth/Sex: Treating RN: 08/28/1925 (88 y.o. [name] Primary Care Brenlee Koskela: [name] Other Clinician: Referring Rosena Bartle: Treating Kyisha Fowle/[name]: [name] in Treatment: 61 Active Problems Location of Pain Severity and Description of Pain Patient Has Paino No Site Locations Rate the pain. Current Pain Level: 0 Pain Management and Medication Current Pain Management: Medication: No Cold Application: No Rest: No Massage: No Activity: No T.E.N.S.: No Heat Application: No Leg drop or elevation: No Is the Current Pain Management Adequate: Adequate How does your wound impact your activities of daily livingo Sleep: No Bathing: No Appetite: No Relationship With Others: No Bladder Continence: No Emotions: No Bowel Continence: No Work: No Toileting: No Drive: No Dressing: No Hobbies: No Electronic Signature(s) Signed: 08/01/2021 4:34:49 PM By: [name] Pilling RN, BSN Entered By: [name] on 08/01/2021 13:59:06 -------------------------------------------------------------------------------- Patient/Caregiver Education Details Patient Name: Date of Service: [name], Terrica A. 7/13/2023andnbsp1:30 PM Medical Record Number: [id number] Patient Account Number: [id number] Date of Birth/Gender: Treating RN: 15-Aug-1925 (88 y.o. [name] Primary Care Physician: [name] Other Clinician: Referring Physician: Treating Physician/[name]: [name] in Treatment: 38 Education Assessment Education Provided To: Patient Education Topics Provided Wound/Skin Impairment: Handouts: Skin Care Do's and Dont's Methods: Explain/Verbal Responses: Reinforcements needed Electronic Signature(s) Signed: 08/01/2021 4:34:49 PM By: [name] Pilling RN, BSN Entered By: [name] on 08/01/2021 14:01:21 -------------------------------------------------------------------------------- Wound Assessment Details Patient Name: Date of Service: [name], [place]. 08/01/2021 1:30 PM Medical Record Number: [id number] Patient Account Number: [id number] Date of Birth/Sex: Treating RN: 14-Jan-1926 (88  y.o. [name] Primary Care Mildred Tuccillo: [name] Other Clinician: Referring Evetta Renner: Treating Terryl Molinelli/[name]: [name] in Treatment: 65 Wound Status Wound Number: 20 Primary Lymphedema Etiology: Wound Location: Bell, Circumferential Lower Leg Wound Open Wounding Event: Gradually Appeared Status: Date Acquired: 06/25/2021 Comorbid Anemia, Arrhythmia, Congestive Heart Failure, Hypertension, Weeks Of Treatment: 5 History: Colitis,  Osteoarthritis Clustered Wound: Yes Photos Wound Measurements Length: (cm) 29 Width: (cm) 23 Depth: (cm) 0.1 Clustered Quantity: 15 Area: (cm) 523.861 Volume: (cm) 52.386 % Reduction in Area: -31.8% % Reduction in Volume: -31.8% Epithelialization: Small (1-33%) Tunneling: No Undermining: No Wound Description Classification: Full Thickness Without Exposed Support Structures Wound Margin: Distinct, outline attached Exudate Amount: Medium Exudate Type: Serosanguineous Exudate Color: red, brown Foul Odor After Cleansing: No Slough/Fibrino No Wound Bed Granulation Amount: Large (67-100%) Exposed Structure Granulation Quality: Pink Fascia Exposed: No Fat Layer (Subcutaneous Tissue) Exposed: Yes Tendon Exposed: No Muscle Exposed: No Joint Exposed: No Bone Exposed: No Treatment Notes Wound #20 (Lower Leg) Wound Laterality: Bell, Circumferential Cleanser Soap and Water Discharge Instruction: May shower and wash wound with dial antibacterial soap and water prior to dressing change. Wound Cleanser Discharge Instruction: Cleanse the wound with wound cleanser prior to applying a clean dressing using gauze sponges, not tissue or cotton balls. Peri-Wound Care Topical Primary Dressing KerraCel Ag Gelling Fiber Dressing, 4x5 in (silver alginate) Discharge Instruction: Apply silver alginate to wound bed as instructed Secondary Dressing ABD Pad, 8x10 Discharge Instruction: Apply over primary  dressing as directed. Woven Gauze Sponge, Non-Sterile 4x4 in Discharge Instruction: Apply over primary dressing as directed. Secured With Compression Wrap ThreePress (3 layer compression wrap) Discharge Instruction: Apply three layer compression as directed. Compression Stockings Add-Ons Electronic Signature(s) Signed: 08/01/2021 4:34:49 PM By: [name] Pilling RN, BSN Entered By: [name] on 08/01/2021 14:16:59 -------------------------------------------------------------------------------- Wound Assessment Details Patient Name: Date of Service: [name], [place]. 08/01/2021 1:30 PM Medical Record Number: [id number] Patient Account Number: [id number] Date of Birth/Sex: Treating RN: 1925/09/01 (88 y.o. [name], [name] Primary Care Slaton Reaser: [name] Other Clinician: Referring Cabria Micalizzi: Treating Elon Eoff/[name]: [name] in Treatment: 52 Wound Status Wound Number: 7RR Primary Malignant Wound Etiology: Wound Location: Left, Circumferential Lower Leg Wound Open Wounding Event: Blister Status: Date Acquired: 04/20/2020 Comorbid Anemia, Arrhythmia, Congestive Heart Failure, Hypertension, Weeks Of Treatment: 59 History: Colitis, Osteoarthritis Clustered Wound: Yes Photos Wound Measurements Length: (cm) 18.5 Width: (cm) 17.5 Depth: (cm) 0.1 Clustered Quantity: 15 Area: (cm) 254.273 Volume: (cm) 25.427 % Reduction in Area: -8179.8% % Reduction in Volume: -8182.4% Epithelialization: Small (1-33%) Tunneling: No Undermining: No Wound Description Classification: Full Thickness Without Exposed Support Structures Wound Margin: Distinct, outline attached Exudate Amount: Medium Exudate Type: Serosanguineous Exudate Color: red, brown Foul Odor After Cleansing: No Slough/Fibrino No Wound Bed Granulation Amount: Large (67-100%) Exposed Structure Granulation Quality: Red, Pink Fascia Exposed: No Necrotic Amount: None Present  (0%) Fat Layer (Subcutaneous Tissue) Exposed: Yes Tendon Exposed: No Muscle Exposed: No Joint Exposed: No Bone Exposed: No Treatment Notes Wound #7RR (Lower Leg) Wound Laterality: Left, Circumferential Cleanser Soap and Water Discharge Instruction: May shower and wash wound with dial antibacterial soap and water prior to dressing change. Wound Cleanser Discharge Instruction: Cleanse the wound with wound cleanser prior to applying a clean dressing using gauze sponges, not tissue or cotton balls. Peri-Wound Care Topical Primary Dressing KerraCel Ag Gelling Fiber Dressing, 4x5 in (silver alginate) Discharge Instruction: Apply silver alginate to wound bed as instructed Secondary Dressing ABD Pad, 8x10 Discharge Instruction: Apply over primary dressing as directed. Woven Gauze Sponge, Non-Sterile 4x4 in Discharge Instruction: Apply over primary dressing as directed. Secured With Compression Wrap ThreePress (3 layer compression wrap) Discharge Instruction: Apply three layer compression as directed. Compression Stockings Add-Ons Electronic Signature(s) Signed: 08/01/2021 4:34:49 PM By: [name] Pilling RN, BSN Entered By: [name] on 08/01/2021 14:17:19 --------------------------------------------------------------------------------  Vitals Details Patient Name: Date of Service: [name], [place]. 08/01/2021 1:30 PM Medical Record Number: [id number] Patient Account Number: [id number] Date of Birth/Sex: Treating RN: Nov 28, 1925 (88 y.o. [name], [name] Primary Care Idabelle Mcpeters: [name] Other Clinician: Referring Arizbeth Cawthorn: Treating Estaban [place]/[name]: [name] in Treatment: 76 Vital Signs Time Taken: 13:40 Temperature (F): 97.1 Height (in): 65 Pulse (bpm): 84 Weight (lbs): 163 Respiratory Rate (breaths/min): 20 Body Mass Index (BMI): 27.1 Blood Pressure (mmHg): 148/83 Reference Range: 80 - 120 mg / dl Electronic  Signature(s) Signed: 08/01/2021 4:34:49 PM By: [name] Pilling RN, BSN Entered By: [name] on 08/01/2021 13:58:37

## 2021-08-01 NOTE — Progress Notes (Addendum)
XAN SPARKMAN ([id number]) , Visit Report for 08/01/2021 Chief Complaint Document Details Patient Name: Date of Service: [name], [place]. 08/01/2021 1:30 PM Medical Record Number: [id number] Patient Account Number: [id number] Date of Birth/Sex: Treating RN: 1925-06-11 (88 y.o. [name] Primary Care Provider: [name] Other Clinician: Referring Provider: Treating Provider/Extender: [name] in Treatment: 80 Information Obtained from: Patient Chief Complaint Bilateral lower extremity wounds that have been biopsied and positive for squamous cell carcinoma Bilateral lower extremity wounds due to chronic venous insufficiency/lymphedema Electronic Signature(s) Signed: 08/01/2021 3:00:51 PM By: Kalman Shan DO Entered By: Kalman Shan on 08/01/2021 14:40:55 -------------------------------------------------------------------------------- HPI Details Patient Name: Date of Service: [name], [place] A. 08/01/2021 1:30 PM Medical Record Number: [id number] Patient Account Number: [id number] Date of Birth/Sex: Treating RN: April 20, 1925 (88 y.o. [name] Primary Care Provider: [name] Other Clinician: Referring Provider: Treating Provider/Extender: [name] in Treatment: 35 History of Present Illness Location: left leg HPI Description: Admission 5/23 Ms. 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 She states that the wound on the right has been present for a year. The 1 on the left has opened up 1 month ago. She is followed with oncology for this issue as she had biopsies that showed squamous cell carcinoma. She is also seeing radiation oncology for treatment  options. She presents today because she would like for her wounds to be healed by [place]. She currently denies signs of infection. 6/1; patient presents for 1 week follow-up. She states she has tolerated the leg wraps well. She states these do not bother her and is happy to continue with them. She is scheduled to see her oncologist today to go over treatment options for the bilateral lower extremity squamous cell carcinoma. Radiation is currently not a recommended option. Patient states she overall feels well. 6/22; patient presents for 3-week follow-up. She has tolerated the wraps well until her last wrap where she states they were uncomfortable. She attributes this to the home health nurse. She denies signs of infection. She has started her first treatment of antibody infusions for her Bilateral lower extremity squamous cell carcinoma. She has no complaints today. 7/21; patient presents for 1 month follow-up. Unfortunately she has not had good experience with her wrap changes with home health. She would like to do her own dressing changes. She continues to do her antibody infusions. She denies signs of infection. 7/28; patient presents for 1 week follow-up. At last clinic visit she was switched to daily dressing changes due to issues with the wrap and home health placing them. Unfortunately she has developed weeping to her legs bilaterally. She would like to be placed in wraps today. She would also like to follow with [place] weekly for wrap changes instead of having home health change them. She denies signs of infection. 8/4; patient presents for 1 week follow-up. She has tolerated the Kerlix/Coban wraps well. She no longer has weeping to her legs. She took the wrap off 1 day before coming in to be able to take a shower. She has no issues or complaints today. She denies signs of infection. 8/18; patient presents for follow-up. Patient has tolerated the wraps well. She brought her Velcro compression wraps  today. She has no  issues or complaints today. She had her chemotherapy infusion yesterday without issues. She denies signs of infection. 8/25; patient presents for follow-up. She used her juxta lite compressions for the past week. It is unclear if she is able to put these on correctly since she states she has a hard time getting them to look right. She reports 2 open wounds. She currently denies signs of infection. 9/1; patient presents for 1 week follow-up. She has 1 open wound. She tolerated the compression wraps well. She currently denies signs of infection. 9/8; patient presents for 1 week follow-up. She has 2 open wounds 1 on each leg. She has tolerated the compression wrap well. She currently denies signs of infection. 9/15; patient presents for 1 week follow-up. She now has 3 wounds. 2 on the left and 1 on the right. She continues to tolerate the compression wrap well. She currently denies signs of infection. She has obtained furosemide by her primary care physician and would like to discuss when to take this. 9/22; patient presents for 1 week follow-up. She has scattered wounds on her lower extremities bilaterally. She did take furosemide twice in the past week. She does not recall having to urinate more frequently. She tolerated the 3 layer compression well. She denies signs of infection. 10/13; patient has not been here recently because of [place]. Apparently the facility was only putting gauze on her legs. This is a patient I do not normally see. 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 Ronnald Ramp at [facility] dermatology. She is quite convincing that she did not have radiation to her lower legs. It is likely she also has significant chronic venous insufficiency stasis dermatitis. We have been using silver alginate under kerlix Coban. She has obvious open areas medially on the right and areas on the left. She also has  areas of extensive dry flaking adherent areas on the right and to a lesser extent on the left anterior. Nodular areas on the left lateral lower leg left posterior calf and right mid calf medially. 10/21; patient presents for follow-up. She has no issues or complaints today. She has tolerated the 3 layer compression well bilaterally. She denies signs of infection. 10/27; patient presents for follow-up. She continues to tolerate the 3 layer compression wrap well. She denies signs of infection. 11/30; patient presents for follow-up. She has no issues or complaints today. 11/10; patient arrived in clinic today for nurse visit accompanied by her daughter from [place]. The daughter had multiple questions so we turned this into doctors visit. Apparently after the last time I saw this woman in October she went to see Dr. Ronnald Ramp but he did not take the wraps off. She previously was treated with Cemiplimab for her squamous cell carcinoma. She did not receive radiation to her legs. I had wanted Dr. Ronnald Ramp to look at this because of the exceptionally damaged skin on her lower legs bilaterally. She has odd looking wounds on the left medial lower leg also extending posteriorly which we have not made a lot of progress on. But she also has a raised hyperkeratotic nodules on the right anterior tibial area plaques on the left medial lower thigh. These are not areas that are under compression. We have been using silver alginate on any open areas Liberal TCA under 3 layer compression. 11/17; patient presents for follow-up. She had her wraps taken off yesterday for her dermatology appointment. She reports excessive  weeping to her legs bilaterally after the wraps were taken off. She currently denies signs of infection. 12/12/2020 upon evaluation today patient appears to be doing about the same in regard to her wounds. She was actually started on Cipro after having biopsies apparently at her dermatology clinic she does not  have the results back from the actual biopsy but the culture did return and apparently they placed her on the Cipro she started that just this morning. Other than that her legs appear to be doing okay at this time. 12/1; patient presents for follow-up. She has no issues or complaints today. She has started Lasix 40 mg daily to help with her bilateral leg swelling. 12/8; patient presents for follow-up. She has no issues or complaints today. 12/15; patient presents for 1 week follow-up. She has no issues or complaints today. 12/22; patient presents for follow-up. She has no issues or complaints today. 1/5; patient presents for follow-up. She has no issues or complaints today. She has tolerated the compression wraps well. 1/12; patient presents for follow-up. She is tolerated the compression wrap well and has no issues or complaints today. She denies signs of infection. 1/19; patient presents for follow-up. She took the compression wraps off 1 to 2 days ago to take a shower and did not have compression wraps replaced. She reports increased blistering throughout her legs bilaterally. She currently denies signs of infection. 1/26; patient presents for follow-up. She has no issues or complaints today. She tolerated the compression wraps well. She has not heard from oncology to schedule an appointment. 2/2; patient presents for follow-up. She has no issues or complaints today. She continues to tolerate the compression wrap well. 2/16; patient presents for follow-up. She has no issues or complaints today. 2/23; patient presents for follow-up. She has no issues or complaints today. Tomorrow she has an appointment with oncology to look at the suspicious lesion on her right lower extremity. She currently denies signs of infection. 3/2; patient presents for follow-up. She has been using her juxta light compression to the right lower extremity however there has been increased swelling and opening of wounds  throughout the legs with weeping. She currently denies signs of infection. She had no issues with the compression wrap to the left lower extremity. 3/16; patient presents for follow-up. She has no issues or complaints today. She states that she has been restarted on infusions for her squamous cell carcinoma of her legs. She has also been increased on Lasix to 40 mg twice daily. She has noticed a decrease in swelling to her legs. 3/23; patient presents for follow-up. She starts her second infusion next week for her squamous cell carcinoma lesions to her legs. She reports no issues with the compression wraps. 3/30; patient presents for follow-up. She has no issues or complaints today. 4/6; patient presents for follow-up. Her left lower extremity wounds have healed. She has her juxta light compression with her today. She has no issues or complaints today. 4/13; this is a patient we actually discharged to juxta lite stockings on the left leg the last time she was here. She came in on Monday for [place] to look at the leg and a nurse visit. She had massive swelling and numerous blisters and wound reopening. We put her back in 3 layer compression although I did not generate a note. Silver alginate on the wounded areas. We have also been doing the same on the right. In the meantime she is obtained her external compression pumps that were  previously ordered and she started to use them. She has skin cancers that are obvious on the right leg x2 her appointment with Dr. Jarome Matin of dermatology is on May 5.. She is also receiving weekly chemotherapy for recurrent presumably squamous cell carcinomas apparently has had 2 treatments 4/20; patient presents for follow-up. She has her new juxta lite compressions today. She continues to have open wounds to the left lower extremity. She has follow-up with dermatology in 2 weeks. She continues to have IV infusions for her squamous cell carcinoma lesions on her legs. 4/28;  the patient has bilateral lower extremity wounds predominantly on the right lateral upper leg, right anterior lower leg which in itself is probably a recurrent cancer as well as the left lateral lower leg. She has recurrent squamous cell carcinoma currently being treated with an IV infusion. She also has scattered nodules on her upper lower legs and thighs I am not certain of all of this is felt to be malignant as well We have been using silver alginate on the open areas wrapping her legs. She also has her external compression pumps at home but I am not clear that she is going to use these 5/2; patient presents for follow-up. She has not been taking her diuretics as prescribed. She sees Dr. Dian Situ, dermatology at the end of the week. She tolerated the compression wraps well today. She has her juxta lite compressions. 5/8; patient presents for follow-up. She saw Dr. Dian Situ, dermatology and she has nothing to report on. She has been using her juxta lite compression to the right lower extremity. She has tolerated the compression wrap well in the left lower extremity. She has not been taking her diuretic. She is scheduled to continue her infusions for her squamous cell carcinoma on her legs. 5/18; patient presents for follow-up. We wrapped her in 3 layer compression at last clinic visit. She has no issues or complaints today. 5/22; patient presents for follow-up. She has been wrapped in 3 layer compression bilaterally to her lower extremities over the past week. She has been scratching at the top of the wrap and picking at her skin. This area has increased warmth and erythema. 5/30; patient presents for follow-up. She completed Keflex with resolution of her symptoms to the right lower extremity. She no longer has increased warmth or erythema. She continues her infusions for her squamous cell carcinoma lesions. We continue to wrap her in 3 layer compression with silver alginate. She currently denies signs of  infection. 6/8; the patient went to see Dr. Ronnald Ramp of dermatology 2 days ago unfortunately we do not have his notes. They removed her compression wraps of course but did not adequately replace them. She comes in today with 3 wounds on the right lateral upper calf 1 medially below the obvious squamous cell carcinoma there is a large necrotic wound on the left lateral tibial area on the left and several blisters. The patient remains on Cemiplimad infusions for the squamous cell carcinoma bilaterally. She is not felt to be a good candidate for radiation. Some of the skin changes superiorly in the upper legs bilaterally according to Dr. Ronnald Ramp related to these infusions the same like fleshy nodules to me not blisters 6/13; patient presents for follow-up. We have been using silver alginate under 3 layer compression bilaterally. She has no issues or complaints today. 6/19; patient presents for follow-up. She will see her oncologist in 3 days and next week her dermatologist. She has no new complaints today.  We are using silver alginate under 3 layer compression bilaterally. 6/29; patient presents for follow-up. She she saw Dr. Darrin Nipper last week and States she received injections to the lesions on her legs. We will ask for office visit records. She could not be wrapped with compression and has more drainage on exam today. 7/6; patient presents for follow-up. She continues to receive injections to the lesions in her legs by her dermatologist. She had this done earlier today. She has no issues or complaints today. 7/13; patient presents for follow-up. She is following with Dr.Kavuri once weekly for treatment of her SCCs/KAs with IL5-FU. We have been wrapping her with silver alginate under Kerlix/Coban. She has no issues or complaints today. Electronic Signature(s) Signed: 08/01/2021 3:00:51 PM By: Kalman Shan DO Entered By: Kalman Shan on 08/01/2021  14:44:38 -------------------------------------------------------------------------------- Physical Exam Details Patient Name: Date of Service: [name], [place]. 08/01/2021 1:30 PM Medical Record Number: [id number] Patient Account Number: [id number] Date of Birth/Sex: Treating RN: 05/10/1925 (88 y.o. [name] Primary Care Provider: [name] Other Clinician: Referring Provider: Treating Provider/Extender: [name] in Treatment: 54 Constitutional respirations regular, non-labored and within target range for patient.. Cardiovascular 2+ dorsalis pedis/posterior tibialis pulses. Psychiatric pleasant and cooperative. Notes Multiple small scattered open wounds to her lower extremities bilaterally limited to skin breakdown. 3 areas suspicious for malignancy. Electronic Signature(s) Signed: 08/01/2021 3:00:51 PM By: Kalman Shan DO Entered By: Kalman Shan on 08/01/2021 14:46:02 -------------------------------------------------------------------------------- Physician Orders Details Patient Name: Date of Service: [name], [place]. 08/01/2021 1:30 PM Medical Record Number: [id number] Patient Account Number: [id number] Date of Birth/Sex: Treating RN: January 24, 1925 (88 y.o. [name], [name] Primary Care Provider: [name] Other Clinician: Referring Provider: Treating Provider/Extender: [name] in Treatment: 63 Verbal / Phone Orders: No Diagnosis Coding ICD-10 Coding Code Description C44.92 Squamous cell carcinoma of skin, unspecified L97.812 Non-pressure chronic ulcer of other part of right lower leg with fat layer exposed L97.822 Non-pressure chronic ulcer of other part of left lower leg with fat layer exposed I89.0 Lymphedema, not elsewhere classified I87.313 Chronic venous hypertension (idiopathic) with ulcer of bilateral lower extremity Follow-up Appointments ppointment in 1  week. - Dr. Heber [place] [facility] [place], Room 8 08/08/2021 Thursday 130pm Return A ppointment in 2 weeks. - Dr. Heber [place] and [place], Room 6 overflow 08/15/2021 Thursday 130pm Return A Other: - Dermatology specialists keep that appt time on weekly. ***when wrapping do not use lotion.*** Bathing/ Shower/ Hygiene May shower with protection but do not get wound dressing(s) wet. - Use cast protector Edema Control - Lymphedema / SCD / Other Lymphedema Pumps. Use Lymphedema pumps on leg(s) 2-3 times a day for 45-60 minutes. If wearing any wraps or hose, do not remove them. Continue exercising as instructed. - use one hour each time twice a day. Elevate legs to the level of the heart or above for 30 minutes daily and/or when sitting, a frequency of: - elevate the legs throughout the day heart level if possible. Avoid standing for long periods of time. Exercise regularly Additional Orders / Instructions Follow Nutritious Diet Wound Treatment Wound #20 - Lower Leg Wound Laterality: Right, Circumferential Cleanser: Soap and Water 1 x Per Week/30 Days Discharge Instructions: May shower and wash wound with dial antibacterial soap and water prior to dressing change. Cleanser: Wound Cleanser 1 x Per Week/30 Days Discharge Instructions: Cleanse the wound with wound cleanser prior to applying a clean dressing using gauze sponges, not tissue or  cotton balls. Prim Dressing: Maxorb Extra Calcium Alginate Dressing, 4x4 in 1 x Per Week/30 Days ary Discharge Instructions: Apply calcium alginate to wound bed as instructed Secondary Dressing: ABD Pad, 8x10 1 x Per Week/30 Days Discharge Instructions: Apply over primary dressing as directed. Secondary Dressing: CarboFLEX Odor Control Dressing, 4x4 in 1 x Per Week/30 Days Discharge Instructions: Apply over primary dressing as directed. Secondary Dressing: Woven Gauze Sponge, Non-Sterile 4x4 in 1 x Per Week/30 Days Discharge Instructions: Apply over primary dressing as  directed. Compression Wrap: ThreePress (3 layer compression wrap) 1 x Per Week/30 Days Discharge Instructions: Apply three layer compression as directed. Wound #7RR - Lower Leg Wound Laterality: Left, Circumferential Cleanser: Soap and Water 1 x Per Week/30 Days Discharge Instructions: May shower and wash wound with dial antibacterial soap and water prior to dressing change. Cleanser: Wound Cleanser 1 x Per Week/30 Days Discharge Instructions: Cleanse the wound with wound cleanser prior to applying a clean dressing using gauze sponges, not tissue or cotton balls. Prim Dressing: Maxorb Extra Calcium Alginate Dressing, 4x4 in 1 x Per Week/30 Days ary Discharge Instructions: Apply calcium alginate to wound bed as instructed Secondary Dressing: ABD Pad, 8x10 1 x Per Week/30 Days Discharge Instructions: Apply over primary dressing as directed. Secondary Dressing: CarboFLEX Odor Control Dressing, 4x4 in 1 x Per Week/30 Days Discharge Instructions: Apply over primary dressing as directed. Secondary Dressing: Woven Gauze Sponge, Non-Sterile 4x4 in 1 x Per Week/30 Days Discharge Instructions: Apply over primary dressing as directed. Compression Wrap: ThreePress (3 layer compression wrap) 1 x Per Week/30 Days Discharge Instructions: Apply three layer compression as directed. Electronic Signature(s) Signed: 08/01/2021 3:00:51 PM By: Kalman Shan DO Entered By: Kalman Shan on 08/01/2021 14:46:14 -------------------------------------------------------------------------------- Problem List Details Patient Name: Date of Service: [name], [place]. 08/01/2021 1:30 PM Medical Record Number: [id number] Patient Account Number: [id number] Date of Birth/Sex: Treating RN: 1925-08-05 (88 y.o. [name], [name] Primary Care Provider: [name] Other Clinician: Referring Provider: Treating Provider/Extender: [name] in Treatment: 70 Active  Problems ICD-10 Encounter Code Description Active Date MDM Diagnosis C44.92 Squamous cell carcinoma of skin, unspecified 06/11/2020 No Yes L97.812 Non-pressure chronic ulcer of other part of right lower leg with fat layer 06/11/2020 No Yes exposed L97.822 Non-pressure chronic ulcer of other part of left lower leg with fat layer exposed5/23/2022 No Yes I89.0 Lymphedema, not elsewhere classified 01/31/2021 No Yes I87.313 Chronic venous hypertension (idiopathic) with ulcer of bilateral lower extremity 03/21/2021 No Yes Inactive Problems Resolved Problems Electronic Signature(s) Signed: 08/01/2021 3:00:51 PM By: Kalman Shan DO Entered By: Kalman Shan on 08/01/2021 14:40:39 -------------------------------------------------------------------------------- Progress Note Details Patient Name: Date of Service: [name], [place] A. 08/01/2021 1:30 PM Medical Record Number: [id number] Patient Account Number: [id number] Date of Birth/Sex: Treating RN: [birth year] (88 y.o. [name], [name] Primary Care Provider: [name] Other Clinician: Referring Provider: Treating Provider/Extender: [name] in Treatment: 72 Subjective Chief Complaint Information obtained from Patient Bilateral lower extremity wounds that have been biopsied and positive for squamous cell carcinoma Bilateral lower extremity wounds due to chronic venous insufficiency/lymphedema History of Present Illness (HPI) The following HPI elements were documented for the patient's wound: Location: left leg Admission 5/23 Ms. 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 She  states that the wound on the right has been present for a year. The 1 on the left has opened up  1 month ago. She is followed with oncology for this issue as she had biopsies that showed squamous cell carcinoma. She is also seeing radiation oncology for treatment options. She presents today because she would like for her wounds to be healed by [place]. She currently denies signs of infection. 6/1; patient presents for 1 week follow-up. She states she has tolerated the leg wraps well. She states these do not bother her and is happy to continue with them. She is scheduled to see her oncologist today to go over treatment options for the bilateral lower extremity squamous cell carcinoma. Radiation is currently not a recommended option. Patient states she overall feels well. 6/22; patient presents for 3-week follow-up. She has tolerated the wraps well until her last wrap where she states they were uncomfortable. She attributes this to the home health nurse. She denies signs of infection. She has started her first treatment of antibody infusions for her Bilateral lower extremity squamous cell carcinoma. She has no complaints today. 7/21; patient presents for 1 month follow-up. Unfortunately she has not had good experience with her wrap changes with home health. She would like to do her own dressing changes. She continues to do her antibody infusions. She denies signs of infection. 7/28; patient presents for 1 week follow-up. At last clinic visit she was switched to daily dressing changes due to issues with the wrap and home health placing them. Unfortunately she has developed weeping to her legs bilaterally. She would like to be placed in wraps today. She would also like to follow with [place] weekly for wrap changes instead of having home health change them. She denies signs of infection. 8/4; patient presents for 1 week follow-up. She has tolerated the Kerlix/Coban wraps well. She no longer has weeping to her legs. She took the wrap off 1 day before coming in to be able to take a shower. She has no issues or  complaints today. She denies signs of infection. 8/18; patient presents for follow-up. Patient has tolerated the wraps well. She brought her Velcro compression wraps today. She has no issues or complaints today. She had her chemotherapy infusion yesterday without issues. She denies signs of infection. 8/25; patient presents for follow-up. She used her juxta lite compressions for the past week. It is unclear if she is able to put these on correctly since she states she has a hard time getting them to look right. She reports 2 open wounds. She currently denies signs of infection. 9/1; patient presents for 1 week follow-up. She has 1 open wound. She tolerated the compression wraps well. She currently denies signs of infection. 9/8; patient presents for 1 week follow-up. She has 2 open wounds 1 on each leg. She has tolerated the compression wrap well. She currently denies signs of infection. 9/15; patient presents for 1 week follow-up. She now has 3 wounds. 2 on the left and 1 on the right. She continues to tolerate the compression wrap well. She currently denies signs of infection. She has obtained furosemide by her primary care physician and would like to discuss when to take this. 9/22; patient presents for 1 week follow-up. She has scattered wounds on her lower extremities bilaterally. She did take furosemide twice in the past week. She does not recall having to urinate more frequently. She tolerated the 3 layer compression well. She denies signs of infection. 10/13;  patient has not been here recently because of COVID. Apparently the facility was only putting gauze on her legs. This is a patient I do not normally see. 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 Ronnald Ramp at [facility] dermatology. She is quite convincing that she did not have radiation to her lower legs. It is likely she also has significant chronic venous  insufficiency stasis dermatitis. We have been using silver alginate under kerlix Coban. She has obvious open areas medially on the right and areas on the left. She also has areas of extensive dry flaking adherent areas on the right and to a lesser extent on the left anterior. Nodular areas on the left lateral lower leg left posterior calf and right mid calf medially. 10/21; patient presents for follow-up. She has no issues or complaints today. She has tolerated the 3 layer compression well bilaterally. She denies signs of infection. 10/27; patient presents for follow-up. She continues to tolerate the 3 layer compression wrap well. She denies signs of infection. 11/30; patient presents for follow-up. She has no issues or complaints today. 11/10; patient arrived in clinic today for nurse visit accompanied by her daughter from [place]. The daughter had multiple questions so we turned this into doctors visit. Apparently after the last time I saw this woman in October she went to see Dr. Ronnald Ramp but he did not take the wraps off. She previously was treated with Cemiplimab for her squamous cell carcinoma. She did not receive radiation to her legs. I had wanted Dr. Ronnald Ramp to look at this because of the exceptionally damaged skin on her lower legs bilaterally. She has odd looking wounds on the left medial lower leg also extending posteriorly which we have not made a lot of progress on. But she also has a raised hyperkeratotic nodules on the right anterior tibial area plaques on the left medial lower thigh. These are not areas that are under compression. We have been using silver alginate on any open areas Liberal TCA under 3 layer compression. 11/17; patient presents for follow-up. She had her wraps taken off yesterday for her dermatology appointment. She reports excessive weeping to her legs bilaterally after the wraps were taken off. She currently denies signs of infection. 12/12/2020 upon evaluation today  patient appears to be doing about the same in regard to her wounds. She was actually started on Cipro after having biopsies apparently at her dermatology clinic she does not have the results back from the actual biopsy but the culture did return and apparently they placed her on the Cipro she started that just this morning. Other than that her legs appear to be doing okay at this time. 12/1; patient presents for follow-up. She has no issues or complaints today. She has started Lasix 40 mg daily to help with her bilateral leg swelling. 12/8; patient presents for follow-up. She has no issues or complaints today. 12/15; patient presents for 1 week follow-up. She has no issues or complaints today. 12/22; patient presents for follow-up. She has no issues or complaints today. 1/5; patient presents for follow-up. She has no issues or complaints today. She has tolerated the compression wraps well. 1/12; patient presents for follow-up. She is tolerated the compression wrap well and has no issues or complaints today. She denies signs of infection. 1/19; patient presents for follow-up. She took the compression wraps off 1 to 2 days ago to take a shower and did  not have compression wraps replaced. She reports increased blistering throughout her legs bilaterally. She currently denies signs of infection. 1/26; patient presents for follow-up. She has no issues or complaints today. She tolerated the compression wraps well. She has not heard from oncology to schedule an appointment. 2/2; patient presents for follow-up. She has no issues or complaints today. She continues to tolerate the compression wrap well. 2/16; patient presents for follow-up. She has no issues or complaints today. 2/23; patient presents for follow-up. She has no issues or complaints today. Tomorrow she has an appointment with oncology to look at the suspicious lesion on her right lower extremity. She currently denies signs of infection. 3/2;  patient presents for follow-up. She has been using her juxta light compression to the right lower extremity however there has been increased swelling and opening of wounds throughout the legs with weeping. She currently denies signs of infection. She had no issues with the compression wrap to the left lower extremity. 3/16; patient presents for follow-up. She has no issues or complaints today. She states that she has been restarted on infusions for her squamous cell carcinoma of her legs. She has also been increased on Lasix to 40 mg twice daily. She has noticed a decrease in swelling to her legs. 3/23; patient presents for follow-up. She starts her second infusion next week for her squamous cell carcinoma lesions to her legs. She reports no issues with the compression wraps. 3/30; patient presents for follow-up. She has no issues or complaints today. 4/6; patient presents for follow-up. Her left lower extremity wounds have healed. She has her juxta light compression with her today. She has no issues or complaints today. 4/13; this is a patient we actually discharged to juxta lite stockings on the left leg the last time she was here. She came in on Monday for [place] to look at the leg and a nurse visit. She had massive swelling and numerous blisters and wound reopening. We put her back in 3 layer compression although I did not generate a note. Silver alginate on the wounded areas. We have also been doing the same on the right. In the meantime she is obtained her external compression pumps that were previously ordered and she started to use them. She has skin cancers that are obvious on the right leg x2 her appointment with Dr. Jarome Matin of dermatology is on May 5.. She is also receiving weekly chemotherapy for recurrent presumably squamous cell carcinomas apparently has had 2 treatments 4/20; patient presents for follow-up. She has her new juxta lite compressions today. She continues to have open wounds  to the left lower extremity. She has follow-up with dermatology in 2 weeks. She continues to have IV infusions for her squamous cell carcinoma lesions on her legs. 4/28; the patient has bilateral lower extremity wounds predominantly on the right lateral upper leg, right anterior lower leg which in itself is probably a recurrent cancer as well as the left lateral lower leg. She has recurrent squamous cell carcinoma currently being treated with an IV infusion. She also has scattered nodules on her upper lower legs and thighs I am not certain of all of this is felt to be malignant as well We have been using silver alginate on the open areas wrapping her legs. She also has her external compression pumps at home but I am not clear that she is going to use these 5/2; patient presents for follow-up. She has not been taking her diuretics as prescribed. She  sees Dr. Dian Situ, dermatology at the end of the week. She tolerated the compression wraps well today. She has her juxta lite compressions. 5/8; patient presents for follow-up. She saw Dr. Dian Situ, dermatology and she has nothing to report on. She has been using her juxta lite compression to the right lower extremity. She has tolerated the compression wrap well in the left lower extremity. She has not been taking her diuretic. She is scheduled to continue her infusions for her squamous cell carcinoma on her legs. 5/18; patient presents for follow-up. We wrapped her in 3 layer compression at last clinic visit. She has no issues or complaints today. 5/22; patient presents for follow-up. She has been wrapped in 3 layer compression bilaterally to her lower extremities over the past week. She has been scratching at the top of the wrap and picking at her skin. This area has increased warmth and erythema. 5/30; patient presents for follow-up. She completed Keflex with resolution of her symptoms to the right lower extremity. She no longer has increased warmth  or erythema. She continues her infusions for her squamous cell carcinoma lesions. We continue to wrap her in 3 layer compression with silver alginate. She currently denies signs of infection. 6/8; the patient went to see Dr. Ronnald Ramp of dermatology 2 days ago unfortunately we do not have his notes. They removed her compression wraps of course but did not adequately replace them. She comes in today with 3 wounds on the right lateral upper calf 1 medially below the obvious squamous cell carcinoma there is a large necrotic wound on the left lateral tibial area on the left and several blisters. The patient remains on Cemiplimad infusions for the squamous cell carcinoma bilaterally. She is not felt to be a good candidate for radiation. Some of the skin changes superiorly in the upper legs bilaterally according to Dr. Ronnald Ramp related to these infusions the same like fleshy nodules to me not blisters 6/13; patient presents for follow-up. We have been using silver alginate under 3 layer compression bilaterally. She has no issues or complaints today. 6/19; patient presents for follow-up. She will see her oncologist in 3 days and next week her dermatologist. She has no new complaints today. We are using silver alginate under 3 layer compression bilaterally. 6/29; patient presents for follow-up. She she saw Dr. Darrin Nipper last week and States she received injections to the lesions on her legs. We will ask for office visit records. She could not be wrapped with compression and has more drainage on exam today. 7/6; patient presents for follow-up. She continues to receive injections to the lesions in her legs by her dermatologist. She had this done earlier today. She has no issues or complaints today. 7/13; patient presents for follow-up. She is following with Dr.Kavuri once weekly for treatment of her SCCs/KAs with [id number]. We have been wrapping her with silver alginate under Kerlix/Coban. She has no issues or  complaints today. Patient History Information obtained from Patient. Family History Unknown History. Social History Never smoker, Marital Status - Single, Alcohol Use - Never, Drug Use - No History, Caffeine Use - Never. Medical History Eyes Denies history of Cataracts, Optic Neuritis Ear/Nose/Mouth/Throat Denies history of Chronic sinus problems/congestion, Middle ear problems Hematologic/Lymphatic Patient has history of Anemia Denies history of Hemophilia, Human Immunodeficiency Virus, Lymphedema, Sickle Cell Disease Respiratory Denies history of Aspiration, Asthma, Chronic Obstructive Pulmonary Disease (COPD), Pneumothorax, Sleep Apnea, Tuberculosis Cardiovascular Patient has history of Arrhythmia - Atrial Flutter, A fibb, Congestive Heart Failure, Hypertension Denies  history of Angina, Coronary Artery Disease, Deep Vein Thrombosis, Hypotension, Myocardial Infarction, Peripheral Arterial Disease, Peripheral Venous Disease, Phlebitis, Vasculitis Gastrointestinal Patient has history of Colitis Denies history of Cirrhosis , Crohnoos, Hepatitis A, Hepatitis B, Hepatitis C Endocrine Denies history of Type I Diabetes, Type II Diabetes Genitourinary Denies history of End Stage Renal Disease Immunological Denies history of Lupus Erythematosus, Raynaudoos, Scleroderma Integumentary (Skin) Denies history of History of Burn Musculoskeletal Patient has history of Osteoarthritis Denies history of Gout, Rheumatoid Arthritis, Osteomyelitis Neurologic Denies history of Dementia, Neuropathy, Quadriplegia, Paraplegia, Seizure Disorder Oncologic Denies history of Received Chemotherapy, Received Radiation Hospitalization/Surgery History - removal of rod left leg. 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 Vitals Time Taken: 1:40 PM, Height: 65 in, Weight: 163 lbs, BMI: 27.1, Temperature: 97.1 F, Pulse: 84 bpm, Respiratory Rate: 20 breaths/min, Blood Pressure: 148/83 mmHg. Cardiovascular 2+ dorsalis pedis/posterior tibialis pulses. Psychiatric pleasant and cooperative. General Notes: Multiple small scattered open wounds to her lower extremities bilaterally limited to skin breakdown. 3 areas suspicious for malignancy. Integumentary (Hair, Skin) Wound #20 status is Open. Original cause of wound was Gradually Appeared. The date acquired was: 06/25/2021. The wound has been in treatment 5 weeks. The wound is located on the Right,Circumferential Lower Leg. The wound measures 29cm length x 23cm width x 0.1cm depth; 523.861cm^2 area and 52.386cm^3 volume. There is Fat Layer (Subcutaneous Tissue) exposed. There is no tunneling or undermining noted. There is a medium amount of serosanguineous drainage noted. The wound margin is distinct with the outline attached to the wound base. There is large (67-100%) pink granulation within the wound bed. Wound #7RR status is Open. Original cause of wound was Blister. The date acquired was: 04/20/2020. The wound has been in treatment 59 weeks. The wound is located on the Left,Circumferential Lower Leg. The wound measures 18.5cm length x 17.5cm width x 0.1cm depth; 254.273cm^2 area and 25.427cm^3 volume. There is Fat Layer (Subcutaneous Tissue) exposed. There is no tunneling or undermining noted. There is a medium amount of serosanguineous drainage noted. The wound margin is distinct with the outline attached to the wound base. There is large (67-100%) red, pink granulation within the wound bed. There is no necrotic tissue within the wound bed. Assessment Active Problems ICD-10 Squamous cell carcinoma of skin, unspecified Non-pressure chronic ulcer of other part of right lower leg with fat layer exposed Non-pressure  chronic ulcer of other part of left lower leg with fat layer exposed Lymphedema, not elsewhere classified Chronic venous hypertension (idiopathic) with ulcer of bilateral lower extremity Patient's wounds are stable. At this point this is a palliative care case. She has squamous cell carcinomas to her lower extremities bilaterally. She is receiving infusions by her oncologist and iL 5Fu injections by her dermatologist. We will continue to wrap her legs Since she has uncontrolled weeping.. I will switch the dressing from silver alginate to calcium alginate. Continue Kerlix/Coban. Procedures Wound #20 Pre-procedure diagnosis of Wound #20 is a Lymphedema located on the Right,Circumferential Lower Leg . There was a Three Layer Compression Therapy Procedure by Deon Pilling, RN. Post procedure Diagnosis Wound #20: Same as Pre-Procedure Wound #7RR Pre-procedure diagnosis of Wound #7RR is a Malignant Wound located on the Left,Circumferential Lower Leg . There was a Three Layer Compression Therapy Procedure by Deon Pilling, RN. Post procedure Diagnosis Wound #7RR: Same as Pre-Procedure Plan Follow-up Appointments: Return Appointment in  1 week. - Dr. Heber [place] [facility] [place], Room 8 08/08/2021 Thursday 130pm Return Appointment in 2 weeks. - Dr. Heber [place] and [place], Room 6 overflow 08/15/2021 Thursday 130pm Other: - Dermatology specialists keep that appt time on weekly. ***when wrapping do not use lotion.*** Bathing/ Shower/ Hygiene: May shower with protection but do not get wound dressing(s) wet. - Use cast protector Edema Control - Lymphedema / SCD / Other: Lymphedema Pumps. Use Lymphedema pumps on leg(s) 2-3 times a day for 45-60 minutes. If wearing any wraps or hose, do not remove them. Continue exercising as instructed. - use one hour each time twice a day. Elevate legs to the level of the heart or above for 30 minutes daily and/or when sitting, a frequency of: - elevate the legs throughout the day heart  level if possible. Avoid standing for long periods of time. Exercise regularly Additional Orders / Instructions: Follow Nutritious Diet WOUND #20: - Lower Leg Wound Laterality: Right, Circumferential Cleanser: Soap and Water 1 x Per Week/30 Days Discharge Instructions: May shower and wash wound with dial antibacterial soap and water prior to dressing change. Cleanser: Wound Cleanser 1 x Per Week/30 Days Discharge Instructions: Cleanse the wound with wound cleanser prior to applying a clean dressing using gauze sponges, not tissue or cotton balls. Prim Dressing: Maxorb Extra Calcium Alginate Dressing, 4x4 in 1 x Per Week/30 Days ary Discharge Instructions: Apply calcium alginate to wound bed as instructed Secondary Dressing: ABD Pad, 8x10 1 x Per Week/30 Days Discharge Instructions: Apply over primary dressing as directed. Secondary Dressing: CarboFLEX Odor Control Dressing, 4x4 in 1 x Per Week/30 Days Discharge Instructions: Apply over primary dressing as directed. Secondary Dressing: Woven Gauze Sponge, Non-Sterile 4x4 in 1 x Per Week/30 Days Discharge Instructions: Apply over primary dressing as directed. Com pression Wrap: ThreePress (3 layer compression wrap) 1 x Per Week/30 Days Discharge Instructions: Apply three layer compression as directed. WOUND #7RR: - Lower Leg Wound Laterality: Left, Circumferential Cleanser: Soap and Water 1 x Per Week/30 Days Discharge Instructions: May shower and wash wound with dial antibacterial soap and water prior to dressing change. Cleanser: Wound Cleanser 1 x Per Week/30 Days Discharge Instructions: Cleanse the wound with wound cleanser prior to applying a clean dressing using gauze sponges, not tissue or cotton balls. Prim Dressing: Maxorb Extra Calcium Alginate Dressing, 4x4 in 1 x Per Week/30 Days ary Discharge Instructions: Apply calcium alginate to wound bed as instructed Secondary Dressing: ABD Pad, 8x10 1 x Per Week/30 Days Discharge  Instructions: Apply over primary dressing as directed. Secondary Dressing: CarboFLEX Odor Control Dressing, 4x4 in 1 x Per Week/30 Days Discharge Instructions: Apply over primary dressing as directed. Secondary Dressing: Woven Gauze Sponge, Non-Sterile 4x4 in 1 x Per Week/30 Days Discharge Instructions: Apply over primary dressing as directed. Com pression Wrap: ThreePress (3 layer compression wrap) 1 x Per Week/30 Days Discharge Instructions: Apply three layer compression as directed. 1. Calcium alginate under Kerlix/Coban 2. Follow-up in 1 week Electronic Signature(s) Signed: 08/01/2021 3:00:51 PM By: Kalman Shan DO Entered By: Kalman Shan on 08/01/2021 14:50:56 -------------------------------------------------------------------------------- HxROS Details Patient Name: Date of Service: [name], [place]. 08/01/2021 1:30 PM Medical Record Number: [id number] Patient Account Number: [id number] Date of Birth/Sex: Treating RN: 10-11-1925 (88 y.o. [name] Primary Care Provider: [name] Other Clinician: Referring Provider: Treating Provider/Extender: [name] in Treatment: 63 Information Obtained From Patient Eyes Medical History: Negative for: Cataracts; Optic Neuritis Ear/Nose/Mouth/Throat Medical History: Negative for: Chronic sinus problems/congestion;  Middle ear problems Hematologic/Lymphatic Medical History: Positive for: Anemia Negative for: Hemophilia; Human Immunodeficiency Virus; Lymphedema; Sickle Cell Disease Respiratory Medical History: Negative for: Aspiration; Asthma; Chronic Obstructive Pulmonary Disease (COPD); Pneumothorax; Sleep Apnea; Tuberculosis Cardiovascular Medical History: Positive for: Arrhythmia - Atrial Flutter, A fibb; Congestive Heart Failure; Hypertension Negative for: Angina; Coronary Artery Disease; Deep Vein Thrombosis; Hypotension; Myocardial Infarction; Peripheral Arterial  Disease; Peripheral Venous Disease; Phlebitis; Vasculitis Past Medical History Notes: hyperlipidemia Gastrointestinal Medical History: Positive for: Colitis Negative for: Cirrhosis ; Crohns; Hepatitis A; Hepatitis B; Hepatitis C Endocrine Medical History: Negative for: Type I Diabetes; Type II Diabetes Past Medical History Notes: hypothyroidism Genitourinary Medical History: Negative for: End Stage Renal Disease Immunological Medical History: Negative for: Lupus Erythematosus; Raynauds; Scleroderma Integumentary (Skin) Medical History: Negative for: History of Burn Musculoskeletal Medical History: Positive for: Osteoarthritis Negative for: Gout; Rheumatoid Arthritis; Osteomyelitis Neurologic Medical History: Negative for: Dementia; Neuropathy; Quadriplegia; Paraplegia; Seizure Disorder Past Medical History Notes: lumbar spindylolysis Oncologic Medical History: Negative for: Received Chemotherapy; Received Radiation Past Medical History Notes: BLE squamous ceel carcionoma Immunizations Pneumococcal Vaccine: Received Pneumococcal Vaccination: Yes Received Pneumococcal Vaccination On or After 60th Birthday: No Implantable Devices None Hospitalization / Surgery History Type of Hospitalization/Surgery removal of rod left leg Family and Social History Unknown History: Yes; Never smoker; Marital Status - Single; Alcohol Use: Never; Drug Use: No History; Caffeine Use: Never; Financial Concerns: No; Food, Clothing or Shelter Needs: No; Support System Lacking: No; Transportation Concerns: No Electronic Signature(s) Signed: 08/01/2021 3:00:51 PM By: Kalman Shan DO Signed: 08/01/2021 4:34:49 PM By: Deon Pilling RN, BSN Entered By: Kalman Shan on 08/01/2021 14:44:44 -------------------------------------------------------------------------------- Reform Details Patient Name: Date of Service: [name], [place] A. 08/01/2021 Medical Record Number: [id number] Patient  Account Number: [id number] Date of Birth/Sex: Treating RN: 10-28-1925 (88 y.o. [name], [name] Primary Care Provider: [name] Other Clinician: Referring Provider: Treating Provider/Extender: [name] in Treatment: 50 Diagnosis Coding ICD-10 Codes Code Description C44.92 Squamous cell carcinoma of skin, unspecified L97.812 Non-pressure chronic ulcer of other part of right lower leg with fat layer exposed L97.822 Non-pressure chronic ulcer of other part of left lower leg with fat layer exposed I89.0 Lymphedema, not elsewhere classified I87.313 Chronic venous hypertension (idiopathic) with ulcer of bilateral lower extremity Facility Procedures CPT4: Code [id number] Description: 81 BILATERAL: Application of multi-layer venous compression system; leg (below knee), including ankle and t. Modifier: Quantity: 1 Physician Procedures : CPT4 Code Description Modifier [id number] - WC PHYS LEVEL 3 - EST PT ICD-10 Diagnosis Description C44.92 Squamous cell carcinoma of skin, unspecified L97.812 Non-pressure chronic ulcer of other part of right lower leg with fat layer exposed L97.822  Non-pressure chronic ulcer of other part of left lower leg with fat layer exposed I87.313 Chronic venous hypertension (idiopathic) with ulcer of bilateral lower extremity Quantity: 1 Electronic Signature(s) Signed: 08/08/2021 4:23:48 PM By: Kalman Shan DO Previous Signature: 08/01/2021 3:00:51 PM Version By: Kalman Shan DO Previous Signature: 08/01/2021 4:34:49 PM Version By: Deon Pilling RN, BSN Entered By: Kalman Shan on 08/08/2021 16:23:12

## 2021-08-02 ENCOUNTER — Telehealth: Payer: Self-pay | Admitting: Oncology

## 2021-08-02 NOTE — Telephone Encounter (Signed)
Called patient regarding upcoming August appointments, left a voicemail. 

## 2021-08-05 ENCOUNTER — Ambulatory Visit (INDEPENDENT_AMBULATORY_CARE_PROVIDER_SITE_OTHER): Payer: Medicare Other | Admitting: Physician Assistant

## 2021-08-05 ENCOUNTER — Encounter: Payer: Self-pay | Admitting: Physician Assistant

## 2021-08-05 VITALS — BP 124/70 | HR 90 | Ht 65.0 in | Wt 155.0 lb

## 2021-08-05 DIAGNOSIS — I10 Essential (primary) hypertension: Secondary | ICD-10-CM

## 2021-08-05 DIAGNOSIS — I5032 Chronic diastolic (congestive) heart failure: Secondary | ICD-10-CM | POA: Diagnosis not present

## 2021-08-05 DIAGNOSIS — I4819 Other persistent atrial fibrillation: Secondary | ICD-10-CM | POA: Diagnosis not present

## 2021-08-05 NOTE — Patient Instructions (Addendum)
Medication Instructions:  Your physician recommends that you continue on your current medications as directed. Please refer to the Current Medication list given to you today.  *If you need a refill on your cardiac medications before your next appointment, please call your pharmacy*   Lab Work: BMET to be done at the cancer center when you have your labs If you have labs (blood work) drawn today and your tests are completely normal, you will receive your results only by: [place] (if you have MyChart) OR A paper copy in the mail If you have any lab test that is abnormal or we need to change your treatment, we will call you to review the results.  Follow-Up: At [facility], you and your health needs are our priority.  As part of our continuing mission to provide you with exceptional heart care, we have created designated Provider Care Teams.  These Care Teams include your primary Cardiologist (physician) and Advanced Practice Providers (APPs -  Physician Assistants and Nurse Practitioners) who all work together to provide you with the care you need, when you need it.   Your next appointment:   1 year(s)  The format for your next appointment:   In Person  Provider:   Lauree Chandler, MD {  Other Instructions Check your blood pressure daily  See your primary care provider in 2-3 days for possible GI bleed  Important Information About Sugar

## 2021-08-07 DIAGNOSIS — I5030 Unspecified diastolic (congestive) heart failure: Secondary | ICD-10-CM | POA: Diagnosis not present

## 2021-08-07 DIAGNOSIS — F331 Major depressive disorder, recurrent, moderate: Secondary | ICD-10-CM | POA: Diagnosis not present

## 2021-08-07 DIAGNOSIS — Z7689 Persons encountering health services in other specified circumstances: Secondary | ICD-10-CM | POA: Diagnosis not present

## 2021-08-07 DIAGNOSIS — M6281 Muscle weakness (generalized): Secondary | ICD-10-CM | POA: Diagnosis not present

## 2021-08-07 DIAGNOSIS — B379 Candidiasis, unspecified: Secondary | ICD-10-CM | POA: Diagnosis not present

## 2021-08-08 ENCOUNTER — Encounter (HOSPITAL_BASED_OUTPATIENT_CLINIC_OR_DEPARTMENT_OTHER): Payer: Medicare Other | Admitting: Internal Medicine

## 2021-08-08 DIAGNOSIS — L97822 Non-pressure chronic ulcer of other part of left lower leg with fat layer exposed: Secondary | ICD-10-CM | POA: Diagnosis not present

## 2021-08-08 DIAGNOSIS — C4492 Squamous cell carcinoma of skin, unspecified: Secondary | ICD-10-CM | POA: Diagnosis not present

## 2021-08-08 DIAGNOSIS — I87313 Chronic venous hypertension (idiopathic) with ulcer of bilateral lower extremity: Secondary | ICD-10-CM

## 2021-08-08 DIAGNOSIS — E039 Hypothyroidism, unspecified: Secondary | ICD-10-CM | POA: Diagnosis not present

## 2021-08-08 DIAGNOSIS — L97812 Non-pressure chronic ulcer of other part of right lower leg with fat layer exposed: Secondary | ICD-10-CM

## 2021-08-08 DIAGNOSIS — I890 Lymphedema, not elsewhere classified: Secondary | ICD-10-CM | POA: Diagnosis not present

## 2021-08-08 DIAGNOSIS — I5030 Unspecified diastolic (congestive) heart failure: Secondary | ICD-10-CM | POA: Diagnosis not present

## 2021-08-08 NOTE — Progress Notes (Signed)
[name] ([id number]) , Visit Report for 08/08/2021 Chief Complaint Document Details Patient Name: Date of Service: [name], [place]. 08/08/2021 1:30 PM Medical Record Number: [id number] Patient Account Number: [id number] Date of Birth/Sex: Treating RN: 1925-10-26 (88 y.o. [name] Primary Care Provider: [name] Other Clinician: Referring Provider: Treating Provider/Extender: [name] in Treatment: 34 Information Obtained from: Patient Chief Complaint Bilateral lower extremity wounds that have been biopsied and positive for squamous cell carcinoma Bilateral lower extremity wounds due to chronic venous insufficiency/lymphedema Electronic Signature(s) Signed: 08/08/2021 4:23:48 PM By: Kalman Shan DO Entered By: Kalman Shan on 08/08/2021 15:41:40 -------------------------------------------------------------------------------- HPI Details Patient Name: Date of Service: [name], [name] 08/08/2021 1:30 PM Medical Record Number: [id number] Patient Account Number: [id number] Date of Birth/Sex: Treating RN: 1925-03-10 (88 y.o. 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 She states that the wound on the right has been present for a year. The 1 on the left has opened up 1 month ago. She is followed with oncology for this issue as she had biopsies that showed squamous cell carcinoma. She is also seeing radiation oncology for treatment  options. She presents today because she would like for her wounds to be healed by [place]. She currently denies signs of infection. 6/1; patient presents for 1 week follow-up. She states she has tolerated the leg wraps well. She states these do not bother her and is happy to continue with them. She is scheduled to see her oncologist today to go over treatment options for the bilateral lower extremity squamous cell carcinoma. Radiation is currently not a recommended option. Patient states she overall feels well. 6/22; patient presents for 3-week follow-up. She has tolerated the wraps well until her last wrap where she states they were uncomfortable. She attributes this to the home health nurse. She denies signs of infection. She has started her first treatment of antibody infusions for her Bilateral lower extremity squamous cell carcinoma. She has no complaints today. 7/21; patient presents for 1 month follow-up. Unfortunately she has not had good experience with her wrap changes with home health. She would like to do her own dressing changes. She continues to do her antibody infusions. She denies signs of infection. 7/28; patient presents for 1 week follow-up. At last clinic visit she was switched to daily dressing changes due to issues with the wrap and home health placing them. Unfortunately she has developed weeping to her legs bilaterally. She would like to be placed in wraps today. She would also like to follow with [place] weekly for wrap changes instead of having home health change them. She denies signs of infection. 8/4; patient presents for 1 week follow-up. She has tolerated the Kerlix/Coban wraps well. She no longer has weeping to her legs. She took the wrap off 1 day before coming in to be able to take a shower. She has no issues or complaints today. She denies signs of infection. 8/18; patient presents for follow-up. Patient has tolerated the wraps well. She brought her Velcro compression wraps  today. She has no  issues or complaints today. She had her chemotherapy infusion yesterday without issues. She denies signs of infection. 8/25; patient presents for follow-up. She used her juxta lite compressions for the past week. It is unclear if she is able to put these on correctly since she states she has a hard time getting them to look right. She reports 2 open wounds. She currently denies signs of infection. 9/1; patient presents for 1 week follow-up. She has 1 open wound. She tolerated the compression wraps well. She currently denies signs of infection. 9/8; patient presents for 1 week follow-up. She has 2 open wounds 1 on each leg. She has tolerated the compression wrap well. She currently denies signs of infection. 9/15; patient presents for 1 week follow-up. She now has 3 wounds. 2 on the left and 1 on the right. She continues to tolerate the compression wrap well. She currently denies signs of infection. She has obtained furosemide by her primary care physician and would like to discuss when to take this. 9/22; patient presents for 1 week follow-up. She has scattered wounds on her lower extremities bilaterally. She did take furosemide twice in the past week. She does not recall having to urinate more frequently. She tolerated the 3 layer compression well. She denies signs of infection. 10/13; patient has not been here recently because of [place]. Apparently the facility was only putting gauze on her legs. This is a patient I do not normally see. 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 Ronnald Ramp at [facility] dermatology. She is quite convincing that she did not have radiation to her lower legs. It is likely she also has significant chronic venous insufficiency stasis dermatitis. We have been using silver alginate under kerlix Coban. She has obvious open areas medially on the right and areas on the left. She also has  areas of extensive dry flaking adherent areas on the right and to a lesser extent on the left anterior. Nodular areas on the left lateral lower leg left posterior calf and right mid calf medially. 10/21; patient presents for follow-up. She has no issues or complaints today. She has tolerated the 3 layer compression well bilaterally. She denies signs of infection. 10/27; patient presents for follow-up. She continues to tolerate the 3 layer compression wrap well. She denies signs of infection. 11/30; patient presents for follow-up. She has no issues or complaints today. 11/10; patient arrived in clinic today for nurse visit accompanied by her daughter from [place]. The daughter had multiple questions so we turned this into doctors visit. Apparently after the last time I saw this woman in October she went to see Dr. Ronnald Ramp but he did not take the wraps off. She previously was treated with Cemiplimab for her squamous cell carcinoma. She did not receive radiation to her legs. I had wanted Dr. Ronnald Ramp to look at this because of the exceptionally damaged skin on her lower legs bilaterally. She has odd looking wounds on the left medial lower leg also extending posteriorly which we have not made a lot of progress on. But she also has a raised hyperkeratotic nodules on the right anterior tibial area plaques on the left medial lower thigh. These are not areas that are under compression. We have been using silver alginate on any open areas Liberal TCA under 3 layer compression. 11/17; patient presents for follow-up. She had her wraps taken off yesterday for her dermatology appointment. She reports excessive  weeping to her legs bilaterally after the wraps were taken off. She currently denies signs of infection. 12/12/2020 upon evaluation today patient appears to be doing about the same in regard to her wounds. She was actually started on Cipro after having biopsies apparently at her dermatology clinic she does not  have the results back from the actual biopsy but the culture did return and apparently they placed her on the Cipro she started that just this morning. Other than that her legs appear to be doing okay at this time. 12/1; patient presents for follow-up. She has no issues or complaints today. She has started Lasix 40 mg daily to help with her bilateral leg swelling. 12/8; patient presents for follow-up. She has no issues or complaints today. 12/15; patient presents for 1 week follow-up. She has no issues or complaints today. 12/22; patient presents for follow-up. She has no issues or complaints today. 1/5; patient presents for follow-up. She has no issues or complaints today. She has tolerated the compression wraps well. 1/12; patient presents for follow-up. She is tolerated the compression wrap well and has no issues or complaints today. She denies signs of infection. 1/19; patient presents for follow-up. She took the compression wraps off 1 to 2 days ago to take a shower and did not have compression wraps replaced. She reports increased blistering throughout her legs bilaterally. She currently denies signs of infection. 1/26; patient presents for follow-up. She has no issues or complaints today. She tolerated the compression wraps well. She has not heard from oncology to schedule an appointment. 2/2; patient presents for follow-up. She has no issues or complaints today. She continues to tolerate the compression wrap well. 2/16; patient presents for follow-up. She has no issues or complaints today. 2/23; patient presents for follow-up. She has no issues or complaints today. Tomorrow she has an appointment with oncology to look at the suspicious lesion on her right lower extremity. She currently denies signs of infection. 3/2; patient presents for follow-up. She has been using her juxta light compression to the right lower extremity however there has been increased swelling and opening of wounds  throughout the legs with weeping. She currently denies signs of infection. She had no issues with the compression wrap to the left lower extremity. 3/16; patient presents for follow-up. She has no issues or complaints today. She states that she has been restarted on infusions for her squamous cell carcinoma of her legs. She has also been increased on Lasix to 40 mg twice daily. She has noticed a decrease in swelling to her legs. 3/23; patient presents for follow-up. She starts her second infusion next week for her squamous cell carcinoma lesions to her legs. She reports no issues with the compression wraps. 3/30; patient presents for follow-up. She has no issues or complaints today. 4/6; patient presents for follow-up. Her left lower extremity wounds have healed. She has her juxta light compression with her today. She has no issues or complaints today. 4/13; this is a patient we actually discharged to juxta lite stockings on the left leg the last time she was here. She came in on Monday for [place] to look at the leg and a nurse visit. She had massive swelling and numerous blisters and wound reopening. We put her back in 3 layer compression although I did not generate a note. Silver alginate on the wounded areas. We have also been doing the same on the right. In the meantime she is obtained her external compression pumps that were  previously ordered and she started to use them. She has skin cancers that are obvious on the right leg x2 her appointment with Dr. Jarome Matin of dermatology is on May 5.. She is also receiving weekly chemotherapy for recurrent presumably squamous cell carcinomas apparently has had 2 treatments 4/20; patient presents for follow-up. She has her new juxta lite compressions today. She continues to have open wounds to the left lower extremity. She has follow-up with dermatology in 2 weeks. She continues to have IV infusions for her squamous cell carcinoma lesions on her legs. 4/28;  the patient has bilateral lower extremity wounds predominantly on the right lateral upper leg, right anterior lower leg which in itself is probably a recurrent cancer as well as the left lateral lower leg. She has recurrent squamous cell carcinoma currently being treated with an IV infusion. She also has scattered nodules on her upper lower legs and thighs I am not certain of all of this is felt to be malignant as well We have been using silver alginate on the open areas wrapping her legs. She also has her external compression pumps at home but I am not clear that she is going to use these 5/2; patient presents for follow-up. She has not been taking her diuretics as prescribed. She sees Dr. Dian Situ, dermatology at the end of the week. She tolerated the compression wraps well today. She has her juxta lite compressions. 5/8; patient presents for follow-up. She saw Dr. Dian Situ, dermatology and she has nothing to report on. She has been using her juxta lite compression to the right lower extremity. She has tolerated the compression wrap well in the left lower extremity. She has not been taking her diuretic. She is scheduled to continue her infusions for her squamous cell carcinoma on her legs. 5/18; patient presents for follow-up. We wrapped her in 3 layer compression at last clinic visit. She has no issues or complaints today. 5/22; patient presents for follow-up. She has been wrapped in 3 layer compression bilaterally to her lower extremities over the past week. She has been scratching at the top of the wrap and picking at her skin. This area has increased warmth and erythema. 5/30; patient presents for follow-up. She completed Keflex with resolution of her symptoms to the right lower extremity. She no longer has increased warmth or erythema. She continues her infusions for her squamous cell carcinoma lesions. We continue to wrap her in 3 layer compression with silver alginate. She currently denies signs of  infection. 6/8; the patient went to see Dr. Ronnald Ramp of dermatology 2 days ago unfortunately we do not have his notes. They removed her compression wraps of course but did not adequately replace them. She comes in today with 3 wounds on the right lateral upper calf 1 medially below the obvious squamous cell carcinoma there is a large necrotic wound on the left lateral tibial area on the left and several blisters. The patient remains on Cemiplimad infusions for the squamous cell carcinoma bilaterally. She is not felt to be a good candidate for radiation. Some of the skin changes superiorly in the upper legs bilaterally according to Dr. Ronnald Ramp related to these infusions the same like fleshy nodules to me not blisters 6/13; patient presents for follow-up. We have been using silver alginate under 3 layer compression bilaterally. She has no issues or complaints today. 6/19; patient presents for follow-up. She will see her oncologist in 3 days and next week her dermatologist. She has no new complaints today.  We are using silver alginate under 3 layer compression bilaterally. 6/29; patient presents for follow-up. She she saw Dr. Darrin Nipper last week and States she received injections to the lesions on her legs. We will ask for office visit records. She could not be wrapped with compression and has more drainage on exam today. 7/6; patient presents for follow-up. She continues to receive injections to the lesions in her legs by her dermatologist. She had this done earlier today. She has no issues or complaints today. 7/13; patient presents for follow-up. She is following with Dr.Kavuri once weekly for treatment of her SCCs/KAs with IL5-FU. We have been wrapping her with silver alginate under Kerlix/Coban. She has no issues or complaints today. 7/20; patient presents for follow-up. She has elected to stop doing weekly treatments for her squamous cell carcinoma lesions. She states she experiences too much pain from  the injections. She is continuing her immunotherapy infusions. Next 1 is scheduled in August. T the legs we have been using silver alginate o under Kerlix/Coban. Her wounds actually look better today. Electronic Signature(s) Signed: 08/08/2021 4:23:48 PM By: Kalman Shan DO Entered By: Kalman Shan on 08/08/2021 15:42:42 -------------------------------------------------------------------------------- Physical Exam Details Patient Name: Date of Service: [name], [name]. 08/08/2021 1:30 PM Medical Record Number: [id number] Patient Account Number: [id number] Date of Birth/Sex: Treating RN: 1925/02/26 (88 y.o. [name] Primary Care Provider: [name] Other Clinician: Referring Provider: Treating Provider/Extender: [name] in Treatment: 60 Constitutional respirations regular, non-labored and within target range for patient.. Cardiovascular 2+ dorsalis pedis/posterior tibialis pulses. Psychiatric pleasant and cooperative. Notes Multiple small scattered open wounds to her lower extremities bilaterally limited to skin breakdown. 3 areas suspicious for malignancy. Electronic Signature(s) Signed: 08/08/2021 4:23:48 PM By: Kalman Shan DO Entered By: Kalman Shan on 08/08/2021 15:43:44 -------------------------------------------------------------------------------- Physician Orders Details Patient Name: Date of Service: [name], [place]. 08/08/2021 1:30 PM Medical Record Number: [id number] Patient Account Number: [id number] Date of Birth/Sex: Treating RN: Feb 27, 1925 (88 y.o. [name], [name] Primary Care Provider: [name] Other Clinician: Referring Provider: Treating Provider/Extender: [name] in Treatment: 50 Verbal / Phone Orders: No Diagnosis Coding ICD-10 Coding Code Description C44.92 Squamous cell carcinoma of skin, unspecified L97.812 Non-pressure chronic  ulcer of other part of right lower leg with fat layer exposed L97.822 Non-pressure chronic ulcer of other part of left lower leg with fat layer exposed I89.0 Lymphedema, not elsewhere classified I87.313 Chronic venous hypertension (idiopathic) with ulcer of bilateral lower extremity Follow-up Appointments ppointment in 1 week. - Dr. Heber [place] and [place], Room 6 overflow 08/15/2021 Thursday 130pm Return A ppointment in 2 weeks. - Dr. Heber [address] and [place], Room 6 overflow 08/22/2021 Thursday 130pm Return A Other: - ***when wrapping do not use lotion.*** Bathing/ Shower/ Hygiene May shower with protection but do not get wound dressing(s) wet. - Use cast protector Edema Control - Lymphedema / SCD / Other Lymphedema Pumps. Use Lymphedema pumps on leg(s) 2-3 times a day for 45-60 minutes. If wearing any wraps or hose, do not remove them. Continue exercising as instructed. - use one hour each time twice a day. Elevate legs to the level of the heart or above for 30 minutes daily and/or when sitting, a frequency of: - elevate the legs throughout the day heart level if possible. Avoid standing for long periods of time. Exercise regularly Additional Orders / Instructions Follow Nutritious Diet Wound Treatment Wound #20 - Lower Leg Wound Laterality: Right, Circumferential Cleanser: Soap  and Water 1 x Per Week/30 Days Discharge Instructions: May shower and wash wound with dial antibacterial soap and water prior to dressing change. Cleanser: Wound Cleanser 1 x Per Week/30 Days Discharge Instructions: Cleanse the wound with wound cleanser prior to applying a clean dressing using gauze sponges, not tissue or cotton balls. Prim Dressing: Maxorb Extra Calcium Alginate Dressing, 4x4 in 1 x Per Week/30 Days ary Discharge Instructions: Apply calcium alginate to wound bed as instructed Prim Dressing: MediHoney Gel, tube 1.5 (oz) 1 x Per Week/30 Days ary Discharge Instructions: Apply to wound bed as  instructed Secondary Dressing: ABD Pad, 8x10 1 x Per Week/30 Days Discharge Instructions: Apply over primary dressing as directed. Secondary Dressing: CarboFLEX Odor Control Dressing, 4x4 in 1 x Per Week/30 Days Discharge Instructions: Apply over primary dressing as directed. Secondary Dressing: Woven Gauze Sponge, Non-Sterile 4x4 in 1 x Per Week/30 Days Discharge Instructions: Apply over primary dressing as directed. Compression Wrap: ThreePress (3 layer compression wrap) 1 x Per Week/30 Days Discharge Instructions: Apply three layer compression as directed. Wound #7RR - Lower Leg Wound Laterality: Left, Circumferential Cleanser: Soap and Water 1 x Per Week/30 Days Discharge Instructions: May shower and wash wound with dial antibacterial soap and water prior to dressing change. Cleanser: Wound Cleanser 1 x Per Week/30 Days Discharge Instructions: Cleanse the wound with wound cleanser prior to applying a clean dressing using gauze sponges, not tissue or cotton balls. Prim Dressing: Maxorb Extra Calcium Alginate Dressing, 4x4 in 1 x Per Week/30 Days ary Discharge Instructions: Apply calcium alginate to wound bed as instructed Prim Dressing: MediHoney Gel, tube 1.5 (oz) 1 x Per Week/30 Days ary Discharge Instructions: Apply to wound bed as instructed Secondary Dressing: ABD Pad, 8x10 1 x Per Week/30 Days Discharge Instructions: Apply over primary dressing as directed. Secondary Dressing: CarboFLEX Odor Control Dressing, 4x4 in 1 x Per Week/30 Days Discharge Instructions: Apply over primary dressing as directed. Secondary Dressing: Woven Gauze Sponge, Non-Sterile 4x4 in 1 x Per Week/30 Days Discharge Instructions: Apply over primary dressing as directed. Compression Wrap: ThreePress (3 layer compression wrap) 1 x Per Week/30 Days Discharge Instructions: Apply three layer compression as directed. Electronic Signature(s) Signed: 08/08/2021 4:23:48 PM By: Kalman Shan DO Entered By:  Kalman Shan on 08/08/2021 15:45:25 -------------------------------------------------------------------------------- Problem List Details Patient Name: Date of Service: [name], [place]. 08/08/2021 1:30 PM Medical Record Number: [id number] Patient Account Number: [id number] Date of Birth/Sex: Treating RN: 1925-02-15 (88 y.o. [name], [name] Primary Care Provider: [name] Other Clinician: Referring Provider: Treating Provider/Extender: [name] in Treatment: 42 Active Problems ICD-10 Encounter Code Description Active Date MDM Diagnosis C44.92 Squamous cell carcinoma of skin, unspecified 06/11/2020 No Yes L97.812 Non-pressure chronic ulcer of other part of right lower leg with fat layer 06/11/2020 No Yes exposed L97.822 Non-pressure chronic ulcer of other part of left lower leg with fat layer exposed5/23/2022 No Yes I89.0 Lymphedema, not elsewhere classified 01/31/2021 No Yes I87.313 Chronic venous hypertension (idiopathic) with ulcer of bilateral lower extremity 03/21/2021 No Yes Inactive Problems Resolved Problems Electronic Signature(s) Signed: 08/08/2021 4:23:48 PM By: Kalman Shan DO Entered By: Kalman Shan on 08/08/2021 15:41:23 -------------------------------------------------------------------------------- Progress Note Details Patient Name: Date of Service: [name], [name] 08/08/2021 1:30 PM Medical Record Number: [id number] Patient Account Number: [id number] Date of Birth/Sex: Treating RN: 1925-06-22 (88 y.o. [name] Primary Care Provider: [name] Other Clinician: Referring Provider: Treating Provider/Extender: [name] in Treatment: [phone]  Subjective Chief Complaint Information obtained from Patient Bilateral lower extremity wounds that have been biopsied and positive for squamous cell carcinoma Bilateral lower extremity wounds due to chronic  venous insufficiency/lymphedema History of Present Illness (HPI) The following HPI elements were documented for the patient's wound: Location: left leg Admission 5/23 [name] is a 88 year old female with a past medical history of 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 She states that the wound on the right has been present for a year. The 1 on the left has opened up 1 month ago. She is followed with oncology for this issue as she had biopsies that showed squamous cell carcinoma. She is also seeing radiation oncology for treatment options. She presents today because she would like for her wounds to be healed by [place]. She currently denies signs of infection. 6/1; patient presents for 1 week follow-up. She states she has tolerated the leg wraps well. She states these do not bother her and is happy to continue with them. She is scheduled to see her oncologist today to go over treatment options for the bilateral lower extremity squamous cell carcinoma. Radiation is currently not a recommended option. Patient states she overall feels well. 6/22; patient presents for 3-week follow-up. She has tolerated the wraps well until her last wrap where she states they were uncomfortable. She attributes this to the home health nurse. She denies signs of infection. She has started her first treatment of antibody infusions for her Bilateral lower extremity squamous cell carcinoma. She has no complaints today. 7/21; patient presents for 1 month follow-up. Unfortunately she has not had good experience with her wrap changes with home health. She would like to do her own dressing changes. She continues to do her antibody infusions. She denies signs of infection. 7/28; patient presents for 1 week follow-up. At last clinic visit she was switched to  daily dressing changes due to issues with the wrap and home health placing them. Unfortunately she has developed weeping to her legs bilaterally. She would like to be placed in wraps today. She would also like to follow with [place] weekly for wrap changes instead of having home health change them. She denies signs of infection. 8/4; patient presents for 1 week follow-up. She has tolerated the Kerlix/Coban wraps well. She no longer has weeping to her legs. She took the wrap off 1 day before coming in to be able to take a shower. She has no issues or complaints today. She denies signs of infection. 8/18; patient presents for follow-up. Patient has tolerated the wraps well. She brought her Velcro compression wraps today. She has no issues or complaints today. She had her chemotherapy infusion yesterday without issues. She denies signs of infection. 8/25; patient presents for follow-up. She used her juxta lite compressions for the past week. It is unclear if she is able to put these on correctly since she states she has a hard time getting them to look right. She reports 2 open wounds. She currently denies signs of infection. 9/1; patient presents for 1 week follow-up. She has 1 open wound. She tolerated the compression wraps well. She currently denies signs of infection. 9/8; patient presents for 1 week follow-up. She has 2 open wounds 1 on each leg. She has tolerated the compression wrap well. She currently denies signs of infection. 9/15; patient presents for 1 week  follow-up. She now has 3 wounds. 2 on the left and 1 on the right. She continues to tolerate the compression wrap well. She currently denies signs of infection. She has obtained furosemide by her primary care physician and would like to discuss when to take this. 9/22; patient presents for 1 week follow-up. She has scattered wounds on her lower extremities bilaterally. She did take furosemide twice in the past week. She does not recall having  to urinate more frequently. She tolerated the 3 layer compression well. She denies signs of infection. 10/13; patient has not been here recently because of [place]. Apparently the facility was only putting gauze on her legs. This is a patient I do not normally see. 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 Ronnald Ramp at [facility] dermatology. She is quite convincing that she did not have radiation to her lower legs. It is likely she also has significant chronic venous insufficiency stasis dermatitis. We have been using silver alginate under kerlix Coban. She has obvious open areas medially on the right and areas on the left. She also has areas of extensive dry flaking adherent areas on the right and to a lesser extent on the left anterior. Nodular areas on the left lateral lower leg left posterior calf and right mid calf medially. 10/21; patient presents for follow-up. She has no issues or complaints today. She has tolerated the 3 layer compression well bilaterally. She denies signs of infection. 10/27; patient presents for follow-up. She continues to tolerate the 3 layer compression wrap well. She denies signs of infection. 11/30; patient presents for follow-up. She has no issues or complaints today. 11/10; patient arrived in clinic today for nurse visit accompanied by her daughter from [place]. The daughter had multiple questions so we turned this into doctors visit. Apparently after the last time I saw this woman in October she went to see Dr. Ronnald Ramp but he did not take the wraps off. She previously was treated with Cemiplimab for her squamous cell carcinoma. She did not receive radiation to her legs. I had wanted Dr. Ronnald Ramp to look at this because of the exceptionally damaged skin on her lower legs bilaterally. She has odd looking wounds on the left medial lower leg also extending posteriorly which we have not made a lot of  progress on. But she also has a raised hyperkeratotic nodules on the right anterior tibial area plaques on the left medial lower thigh. These are not areas that are under compression. We have been using silver alginate on any open areas Liberal TCA under 3 layer compression. 11/17; patient presents for follow-up. She had her wraps taken off yesterday for her dermatology appointment. She reports excessive weeping to her legs bilaterally after the wraps were taken off. She currently denies signs of infection. 12/12/2020 upon evaluation today patient appears to be doing about the same in regard to her wounds. She was actually started on Cipro after having biopsies apparently at her dermatology clinic she does not have the results back from the actual biopsy but the culture did return and apparently they placed her on the Cipro she started that just this morning. Other than that her legs appear to be doing okay at this time. 12/1; patient presents for follow-up. She has no issues or complaints today. She has started Lasix 40 mg daily to help with her bilateral leg swelling. 12/8; patient presents for follow-up. She has no issues  or complaints today. 12/15; patient presents for 1 week follow-up. She has no issues or complaints today. 12/22; patient presents for follow-up. She has no issues or complaints today. 1/5; patient presents for follow-up. She has no issues or complaints today. She has tolerated the compression wraps well. 1/12; patient presents for follow-up. She is tolerated the compression wrap well and has no issues or complaints today. She denies signs of infection. 1/19; patient presents for follow-up. She took the compression wraps off 1 to 2 days ago to take a shower and did not have compression wraps replaced. She reports increased blistering throughout her legs bilaterally. She currently denies signs of infection. 1/26; patient presents for follow-up. She has no issues or complaints today.  She tolerated the compression wraps well. She has not heard from oncology to schedule an appointment. 2/2; patient presents for follow-up. She has no issues or complaints today. She continues to tolerate the compression wrap well. 2/16; patient presents for follow-up. She has no issues or complaints today. 2/23; patient presents for follow-up. She has no issues or complaints today. Tomorrow she has an appointment with oncology to look at the suspicious lesion on her right lower extremity. She currently denies signs of infection. 3/2; patient presents for follow-up. She has been using her juxta light compression to the right lower extremity however there has been increased swelling and opening of wounds throughout the legs with weeping. She currently denies signs of infection. She had no issues with the compression wrap to the left lower extremity. 3/16; patient presents for follow-up. She has no issues or complaints today. She states that she has been restarted on infusions for her squamous cell carcinoma of her legs. She has also been increased on Lasix to 40 mg twice daily. She has noticed a decrease in swelling to her legs. 3/23; patient presents for follow-up. She starts her second infusion next week for her squamous cell carcinoma lesions to her legs. She reports no issues with the compression wraps. 3/30; patient presents for follow-up. She has no issues or complaints today. 4/6; patient presents for follow-up. Her left lower extremity wounds have healed. She has her juxta light compression with her today. She has no issues or complaints today. 4/13; this is a patient we actually discharged to juxta lite stockings on the left leg the last time she was here. She came in on Monday for [place] to look at the leg and a nurse visit. She had massive swelling and numerous blisters and wound reopening. We put her back in 3 layer compression although I did not generate a note. Silver alginate on the  wounded areas. We have also been doing the same on the right. In the meantime she is obtained her external compression pumps that were previously ordered and she started to use them. She has skin cancers that are obvious on the right leg x2 her appointment with Dr. Jarome Matin of dermatology is on May 5.. She is also receiving weekly chemotherapy for recurrent presumably squamous cell carcinomas apparently has had 2 treatments 4/20; patient presents for follow-up. She has her new juxta lite compressions today. She continues to have open wounds to the left lower extremity. She has follow-up with dermatology in 2 weeks. She continues to have IV infusions for her squamous cell carcinoma lesions on her legs. 4/28; the patient has bilateral lower extremity wounds predominantly on the right lateral upper leg, right anterior lower leg which in itself is probably a recurrent cancer as well as  the left lateral lower leg. She has recurrent squamous cell carcinoma currently being treated with an IV infusion. She also has scattered nodules on her upper lower legs and thighs I am not certain of all of this is felt to be malignant as well We have been using silver alginate on the open areas wrapping her legs. She also has her external compression pumps at home but I am not clear that she is going to use these 5/2; patient presents for follow-up. She has not been taking her diuretics as prescribed. She sees Dr. Dian Situ, dermatology at the end of the week. She tolerated the compression wraps well today. She has her juxta lite compressions. 5/8; patient presents for follow-up. She saw Dr. Dian Situ, dermatology and she has nothing to report on. She has been using her juxta lite compression to the right lower extremity. She has tolerated the compression wrap well in the left lower extremity. She has not been taking her diuretic. She is scheduled to continue her infusions for her squamous cell carcinoma on her legs. 5/18;  patient presents for follow-up. We wrapped her in 3 layer compression at last clinic visit. She has no issues or complaints today. 5/22; patient presents for follow-up. She has been wrapped in 3 layer compression bilaterally to her lower extremities over the past week. She has been scratching at the top of the wrap and picking at her skin. This area has increased warmth and erythema. 5/30; patient presents for follow-up. She completed Keflex with resolution of her symptoms to the right lower extremity. She no longer has increased warmth or erythema. She continues her infusions for her squamous cell carcinoma lesions. We continue to wrap her in 3 layer compression with silver alginate. She currently denies signs of infection. 6/8; the patient went to see Dr. Ronnald Ramp of dermatology 2 days ago unfortunately we do not have his notes. They removed her compression wraps of course but did not adequately replace them. She comes in today with 3 wounds on the right lateral upper calf 1 medially below the obvious squamous cell carcinoma there is a large necrotic wound on the left lateral tibial area on the left and several blisters. The patient remains on Cemiplimad infusions for the squamous cell carcinoma bilaterally. She is not felt to be a good candidate for radiation. Some of the skin changes superiorly in the upper legs bilaterally according to Dr. Ronnald Ramp related to these infusions the same like fleshy nodules to me not blisters 6/13; patient presents for follow-up. We have been using silver alginate under 3 layer compression bilaterally. She has no issues or complaints today. 6/19; patient presents for follow-up. She will see her oncologist in 3 days and next week her dermatologist. She has no new complaints today. We are using silver alginate under 3 layer compression bilaterally. 6/29; patient presents for follow-up. She she saw Dr. Darrin Nipper last week and States she received injections to the lesions on her  legs. We will ask for office visit records. She could not be wrapped with compression and has more drainage on exam today. 7/6; patient presents for follow-up. She continues to receive injections to the lesions in her legs by her dermatologist. She had this done earlier today. She has no issues or complaints today. 7/13; patient presents for follow-up. She is following with Dr.Kavuri once weekly for treatment of her SCCs/KAs with [id number]. We have been wrapping her with silver alginate under Kerlix/Coban. She has no issues or complaints today. 7/20; patient presents for  follow-up. She has elected to stop doing weekly treatments for her squamous cell carcinoma lesions. She states she experiences too much pain from the injections. She is continuing her immunotherapy infusions. Next 1 is scheduled in August. T the legs we have been using silver alginate o under Kerlix/Coban. Her wounds actually look better today. Patient History Information obtained from Patient. Family History Unknown History. Social History Never smoker, Marital Status - Single, Alcohol Use - Never, Drug Use - No History, Caffeine Use - Never. Medical History Eyes Denies history of Cataracts, Optic Neuritis Ear/Nose/Mouth/Throat Denies history of Chronic sinus problems/congestion, Middle ear problems Hematologic/Lymphatic Patient has history of Anemia Denies history of Hemophilia, Human Immunodeficiency Virus, Lymphedema, Sickle Cell Disease Respiratory Denies history of Aspiration, Asthma, Chronic Obstructive Pulmonary Disease (COPD), Pneumothorax, Sleep Apnea, Tuberculosis Cardiovascular Patient has history of Arrhythmia - Atrial Flutter, A fibb, Congestive Heart Failure, Hypertension Denies history of Angina, Coronary Artery Disease, Deep Vein Thrombosis, Hypotension, Myocardial Infarction, Peripheral Arterial Disease, Peripheral Venous Disease, Phlebitis, Vasculitis Gastrointestinal Patient has history of  Colitis Denies history of Cirrhosis , Crohnoos, Hepatitis A, Hepatitis B, Hepatitis C Endocrine Denies history of Type I Diabetes, Type II Diabetes Genitourinary Denies history of End Stage Renal Disease Immunological Denies history of Lupus Erythematosus, Raynaudoos, Scleroderma Integumentary (Skin) Denies history of History of Burn Musculoskeletal Patient has history of Osteoarthritis Denies history of Gout, Rheumatoid Arthritis, Osteomyelitis Neurologic Denies history of Dementia, Neuropathy, Quadriplegia, Paraplegia, Seizure Disorder Oncologic Denies history of Received Chemotherapy, Received Radiation Hospitalization/Surgery History - removal of rod left leg. 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 Vitals Time Taken: 1:42 PM, Height: 65 in, Weight: 163 lbs, BMI: 27.1, Temperature: 97.9 F, Pulse: 77 bpm, Respiratory Rate: 22 breaths/min, Blood Pressure: 154/88 mmHg. Cardiovascular 2+ dorsalis pedis/posterior tibialis pulses. Psychiatric pleasant and cooperative. General Notes: Multiple small scattered open wounds to her lower extremities bilaterally limited to skin breakdown. 3 areas suspicious for malignancy. Integumentary (Hair, Skin) Wound #20 status is Open. Original cause of wound was Gradually Appeared. The date acquired was: 06/25/2021. The wound has been in treatment 6 weeks. The wound is located on the Right,Circumferential Lower Leg. The wound measures 16cm length x 3cm width x 0.1cm depth; 37.699cm^2 area and 3.77cm^3 volume. There is Fat Layer (Subcutaneous Tissue) exposed. There is no tunneling or undermining noted. There is a medium amount of serosanguineous drainage noted. The wound margin is distinct with the outline attached to the wound  base. There is large (67-100%) pink granulation within the wound bed. Wound #7RR status is Open. Original cause of wound was Blister. The date acquired was: 04/20/2020. The wound has been in treatment 60 weeks. The wound is located on the Left,Circumferential Lower Leg. The wound measures 16.2cm length x 5.3cm width x 0.1cm depth; 67.434cm^2 area and 6.743cm^3 volume. There is Fat Layer (Subcutaneous Tissue) exposed. There is no tunneling or undermining noted. There is a medium amount of serosanguineous drainage noted. The wound margin is distinct with the outline attached to the wound base. There is large (67-100%) red, pink granulation within the wound bed. There is no necrotic tissue within the wound bed. Assessment Active Problems ICD-10 Squamous cell carcinoma of skin, unspecified Non-pressure chronic ulcer of other part of right lower leg with fat layer exposed Non-pressure chronic ulcer of other part of left lower leg with fat layer exposed Lymphedema, not elsewhere classified Chronic  venous hypertension (idiopathic) with ulcer of bilateral lower extremity Unfortunately patient has elected not to continue chemo injections for her Squamous cell carcinoma lesions on her lower extremities. There has been improvement in her lesion size and appearance. For now we will continue with calcium alginate but will add medihoney. Continue compression wraps. Follow-up in 1 week. Procedures Wound #20 Pre-procedure diagnosis of Wound #20 is a Lymphedema located on the Right,Circumferential Lower Leg . There was a Three Layer Compression Therapy Procedure by Deon Pilling, RN. Post procedure Diagnosis Wound #20: Same as Pre-Procedure Wound #7RR Pre-procedure diagnosis of Wound #7RR is a Malignant Wound located on the Left,Circumferential Lower Leg . There was a Three Layer Compression Therapy Procedure by Deon Pilling, RN. Post procedure Diagnosis Wound #7RR: Same as Pre-Procedure Plan Follow-up  Appointments: Return Appointment in 1 week. - Dr. Heber [place] and [place], Room 6 overflow 08/15/2021 Thursday 130pm Return Appointment in 2 weeks. - Dr. Heber [place] and [place], Room 6 overflow 08/22/2021 Thursday 130pm Other: - ***when wrapping do not use lotion.*** Bathing/ Shower/ Hygiene: May shower with protection but do not get wound dressing(s) wet. - Use cast protector Edema Control - Lymphedema / SCD / Other: Lymphedema Pumps. Use Lymphedema pumps on leg(s) 2-3 times a day for 45-60 minutes. If wearing any wraps or hose, do not remove them. Continue exercising as instructed. - use one hour each time twice a day. Elevate legs to the level of the heart or above for 30 minutes daily and/or when sitting, a frequency of: - elevate the legs throughout the day heart level if possible. Avoid standing for long periods of time. Exercise regularly Additional Orders / Instructions: Follow Nutritious Diet WOUND #20: - Lower Leg Wound Laterality: Right, Circumferential Cleanser: Soap and Water 1 x Per Week/30 Days Discharge Instructions: May shower and wash wound with dial antibacterial soap and water prior to dressing change. Cleanser: Wound Cleanser 1 x Per Week/30 Days Discharge Instructions: Cleanse the wound with wound cleanser prior to applying a clean dressing using gauze sponges, not tissue or cotton balls. Prim Dressing: Maxorb Extra Calcium Alginate Dressing, 4x4 in 1 x Per Week/30 Days ary Discharge Instructions: Apply calcium alginate to wound bed as instructed Prim Dressing: MediHoney Gel, tube 1.5 (oz) 1 x Per Week/30 Days ary Discharge Instructions: Apply to wound bed as instructed Secondary Dressing: ABD Pad, 8x10 1 x Per Week/30 Days Discharge Instructions: Apply over primary dressing as directed. Secondary Dressing: CarboFLEX Odor Control Dressing, 4x4 in 1 x Per Week/30 Days Discharge Instructions: Apply over primary dressing as directed. Secondary Dressing: Woven Gauze Sponge,  Non-Sterile 4x4 in 1 x Per Week/30 Days Discharge Instructions: Apply over primary dressing as directed. Com pression Wrap: ThreePress (3 layer compression wrap) 1 x Per Week/30 Days Discharge Instructions: Apply three layer compression as directed. WOUND #7RR: - Lower Leg Wound Laterality: Left, Circumferential Cleanser: Soap and Water 1 x Per Week/30 Days Discharge Instructions: May shower and wash wound with dial antibacterial soap and water prior to dressing change. Cleanser: Wound Cleanser 1 x Per Week/30 Days Discharge Instructions: Cleanse the wound with wound cleanser prior to applying a clean dressing using gauze sponges, not tissue or cotton balls. Prim Dressing: Maxorb Extra Calcium Alginate Dressing, 4x4 in 1 x Per Week/30 Days ary Discharge Instructions: Apply calcium alginate to wound bed as instructed Prim Dressing: MediHoney Gel, tube 1.5 (oz) 1 x Per Week/30 Days ary Discharge Instructions: Apply to wound bed as instructed Secondary Dressing: ABD Pad, 8x10 1 x Per Week/30  Days Discharge Instructions: Apply over primary dressing as directed. Secondary Dressing: CarboFLEX Odor Control Dressing, 4x4 in 1 x Per Week/30 Days Discharge Instructions: Apply over primary dressing as directed. Secondary Dressing: Woven Gauze Sponge, Non-Sterile 4x4 in 1 x Per Week/30 Days Discharge Instructions: Apply over primary dressing as directed. Com pression Wrap: ThreePress (3 layer compression wrap) 1 x Per Week/30 Days Discharge Instructions: Apply three layer compression as directed. 1. Calcium alginate with Medihoney under 3 layer compression 2. Follow up in one week Electronic Signature(s) Signed: 08/08/2021 4:23:48 PM By: Kalman Shan DO Entered By: Kalman Shan on 08/08/2021 15:48:33 -------------------------------------------------------------------------------- HxROS Details Patient Name: Date of Service: [name], [place]. 08/08/2021 1:30 PM Medical Record Number:  [id number] Patient Account Number: [id number] Date of Birth/Sex: Treating RN: 05-30-1925 (88 y.o. [name] Primary Care Provider: [name] Other Clinician: Referring Provider: Treating Provider/Extender: [name] in Treatment: 32 Information Obtained From Patient Eyes Medical History: Negative for: Cataracts; Optic Neuritis Ear/Nose/Mouth/Throat Medical History: Negative for: Chronic sinus problems/congestion; Middle ear problems Hematologic/Lymphatic Medical History: Positive for: Anemia Negative for: Hemophilia; Human Immunodeficiency Virus; Lymphedema; Sickle Cell Disease Respiratory Medical History: Negative for: Aspiration; Asthma; Chronic Obstructive Pulmonary Disease (COPD); Pneumothorax; Sleep Apnea; Tuberculosis Cardiovascular Medical History: Positive for: Arrhythmia - Atrial Flutter, A fibb; Congestive Heart Failure; Hypertension Negative for: Angina; Coronary Artery Disease; Deep Vein Thrombosis; Hypotension; Myocardial Infarction; Peripheral Arterial Disease; Peripheral Venous Disease; Phlebitis; Vasculitis Past Medical History Notes: hyperlipidemia Gastrointestinal Medical History: Positive for: Colitis Negative for: Cirrhosis ; Crohns; Hepatitis A; Hepatitis B; Hepatitis C Endocrine Medical History: Negative for: Type I Diabetes; Type II Diabetes Past Medical History Notes: hypothyroidism Genitourinary Medical History: Negative for: End Stage Renal Disease Immunological Medical History: Negative for: Lupus Erythematosus; Raynauds; Scleroderma Integumentary (Skin) Medical History: Negative for: History of Burn Musculoskeletal Medical History: Positive for: Osteoarthritis Negative for: Gout; Rheumatoid Arthritis; Osteomyelitis Neurologic Medical History: Negative for: Dementia; Neuropathy; Quadriplegia; Paraplegia; Seizure Disorder Past Medical History Notes: lumbar  spindylolysis Oncologic Medical History: Negative for: Received Chemotherapy; Received Radiation Past Medical History Notes: BLE squamous ceel carcionoma Immunizations Pneumococcal Vaccine: Received Pneumococcal Vaccination: Yes Received Pneumococcal Vaccination On or After 60th Birthday: No Implantable Devices None Hospitalization / Surgery History Type of Hospitalization/Surgery removal of rod left leg Family and Social History Unknown History: Yes; Never smoker; Marital Status - Single; Alcohol Use: Never; Drug Use: No History; Caffeine Use: Never; Financial Concerns: No; Food, Clothing or Shelter Needs: No; Support System Lacking: No; Transportation Concerns: No Electronic Signature(s) Signed: 08/08/2021 4:23:48 PM By: Kalman Shan DO Signed: 08/08/2021 5:46:02 PM By: Deon Pilling RN, BSN Entered By: Kalman Shan on 08/08/2021 15:42:49 -------------------------------------------------------------------------------- SuperBill Details Patient Name: Date of Service: [name], Hanna A. 08/08/2021 Medical Record Number: [id number] Patient Account Number: [id number] Date of Birth/Sex: Treating RN: 11/17/1925 (88 y.o. [name], [name] Primary Care Provider: [name] Other Clinician: Referring Provider: Treating Provider/Extender: [name] in Treatment: 60 Diagnosis Coding ICD-10 Codes Code Description C44.92 Squamous cell carcinoma of skin, unspecified L97.812 Non-pressure chronic ulcer of other part of right lower leg with fat layer exposed L97.822 Non-pressure chronic ulcer of other part of left lower leg with fat layer exposed I89.0 Lymphedema, not elsewhere classified I87.313 Chronic venous hypertension (idiopathic) with ulcer of bilateral lower extremity Facility Procedures CPT4: Code [id number] Description: 81 BILATERAL: Application of multi-layer venous compression system; leg (below knee), including ankle and  t. Modifier: Quantity: 1 Physician Procedures :  CPT4 Code Description Modifier [id number] - WC PHYS LEVEL 3 - EST PT ICD-10 Diagnosis Description I34.373 Non-pressure chronic ulcer of other part of right lower leg with fat layer exposed L97.822 Non-pressure chronic ulcer of other part of left  lower leg with fat layer exposed C44.92 Squamous cell carcinoma of skin, unspecified I87.313 Chronic venous hypertension (idiopathic) with ulcer of bilateral lower extremity Quantity: 1 Electronic Signature(s) Signed: 08/08/2021 4:23:48 PM By: Kalman Shan DO Entered By: Kalman Shan on 08/08/2021 15:51:21

## 2021-08-08 NOTE — Progress Notes (Signed)
[name] ([id number]) , Visit Report for 08/08/2021 Arrival Information Details Patient Name: Date of Service: [name], [name] 08/08/2021 1:30 PM Medical Record Number: [id number] Patient Account Number: [id number] Date of Birth/Sex: Treating RN: 1925-04-12 (88 y.o. [name], [name] Primary Care [name]: [name] Other Clinician: Referring Fed Ceci: Treating Cashlyn Huguley/Extender: [name] in Treatment: 34 Visit Information History Since Last Visit Added or deleted any medications: No Patient Arrived: Wheel Chair Any new allergies or adverse reactions: No Arrival Time: 13:41 Had a fall or experienced change in No Accompanied By: caregiver activities of daily living that may affect Transfer Assistance: Manual risk of falls: Patient Identification Verified: Yes Signs or symptoms of abuse/neglect since last visito No Secondary Verification Process Completed: Yes Hospitalized since last visit: No Patient Requires Transmission-Based No Implantable device outside of the clinic excluding No Precautions: cellular tissue based products placed in the center Patient Has Alerts: Yes since last visit: Patient Alerts: R ABI = Non compressible Has Dressing in Place as Prescribed: Yes L ABI = Non compressible Has Compression in Place as Prescribed: Yes Pain Present Now: No Electronic Signature(s) Signed: 08/08/2021 4:23:23 PM By: [name] Entered By: [name] on 08/08/2021 13:41:55 -------------------------------------------------------------------------------- Compression Therapy Details Patient Name: Date of Service: [name], [name] 08/08/2021 1:30 PM Medical Record Number: [id number] Patient Account Number: [id number] Date of Birth/Sex: Treating RN: 20-Dec-1925 (88 y.o. [name] Primary Care Dynesha Woolen: [name] Other Clinician: Referring [name]: Treating Louis Ivery/Extender: [name] in Treatment: 60 Compression Therapy Performed for Wound Assessment: Wound #20 Bell,Circumferential Lower Leg Performed By: Clinician [name] Pilling, RN Compression Type: Three Layer Post Procedure Diagnosis Same as Pre-procedure Electronic Signature(s) Signed: 08/08/2021 5:46:02 PM By: [name] Pilling RN, BSN Entered By: [name] on 08/08/2021 14:53:57 -------------------------------------------------------------------------------- Compression Therapy Details Patient Name: Date of Service: [name], [name] 08/08/2021 1:30 PM Medical Record Number: [id number] Patient Account Number: [id number] Date of Birth/Sex: Treating RN: 06-11-1925 (88 y.o. [name] Primary Care Aahil Fredin: [name] Other Clinician: Referring Anaysia Germer: Treating [name]/Extender: [name] in Treatment: 60 Compression Therapy Performed for Wound Assessment: Wound #7RR Left,Circumferential Lower Leg Performed By: Clinician [name] Pilling, RN Compression Type: Three Layer Post Procedure Diagnosis Same as Pre-procedure Electronic Signature(s) Signed: 08/08/2021 5:46:02 PM By: [name] Pilling RN, BSN Entered By: [name] on 08/08/2021 14:53:57 -------------------------------------------------------------------------------- Encounter Discharge Information Details Patient Name: Date of Service: [name], [name] 08/08/2021 1:30 PM Medical Record Number: [id number] Patient Account Number: [id number] Date of Birth/Sex: Treating RN: 1925-10-16 (88 y.o. [name] Primary Care [name]: [name] Other Clinician: Referring [name]: Treating [name]/Extender: [name] in Treatment: 13 Encounter Discharge Information Items Discharge Condition: Stable Ambulatory Status: Walker Discharge Destination: Home Transportation: Private Auto Accompanied By: transported Schedule Follow-up  Appointment: Yes Clinical Summary of Care: Electronic Signature(s) Signed: 08/08/2021 5:46:02 PM By: [name] Pilling RN, BSN Entered By: [name] on 08/08/2021 14:56:29 -------------------------------------------------------------------------------- Lower Extremity Assessment Details Patient Name: Date of Service: [name], [name] 08/08/2021 1:30 PM Medical Record Number: [id number] Patient Account Number: [id number] Date of Birth/Sex: Treating RN: 03-04-1925 (88 y.o. [name] Primary Care Caymen Dubray: [name] Other Clinician: Referring [name]: Treating [name]/Extender: [name] in Treatment: 60 Edema Assessment Assessed: [Left: No] [Bell: No] Edema: [Left: Yes] [Bell: Yes] Calf Left: Bell: Point of Measurement: 30 cm From Medial Instep 32.5 cm 33.7  cm Ankle Left: Bell: Point of Measurement: 9 cm From Medial Instep 21.5 cm 22 cm Electronic Signature(s) Signed: 08/08/2021 4:23:23 PM By: [name] Signed: 08/08/2021 5:46:02 PM By: [name] Pilling RN, BSN Entered By: [name] on 08/08/2021 14:26:49 -------------------------------------------------------------------------------- Multi Wound Chart Details Patient Name: Date of Service: [name], [name] 08/08/2021 1:30 PM Medical Record Number: [id number] Patient Account Number: [id number] Date of Birth/Sex: Treating RN: [birth year] (88 y.o. [name], [name] Primary Care [name]: [name] Other Clinician: Referring [name]: Treating [name]/Extender: [name] in Treatment: 60 Vital Signs Height(in): 65 Pulse(bpm): 72 Weight(lbs): 163 Blood Pressure(mmHg): 154/88 Body Mass Index(BMI): 27.1 Temperature(F): 97.9 Respiratory Rate(breaths/min): 22 Photos: [N/A:N/A] Bell, Circumferential Lower Leg Left, Circumferential Lower Leg N/A Wound Location: Gradually Appeared Blister N/A Wounding  Event: Lymphedema Malignant Wound N/A Primary Etiology: Anemia, Arrhythmia, Congestive Heart Anemia, Arrhythmia, Congestive Heart N/A Comorbid History: Failure, Hypertension, Colitis, Failure, Hypertension, Colitis, Osteoarthritis Osteoarthritis 06/25/2021 04/20/2020 N/A Date Acquired: 6 60 N/A Weeks of Treatment: Open Open N/A Wound Status: No Yes N/A Wound Recurrence: Yes Yes N/A Clustered Wound: 15 15 N/A Clustered Quantity: 16x3x0.1 16.2x5.3x0.1 N/A Measurements L x W x D (cm) 37.699 67.434 N/A A (cm) : rea 3.77 6.743 N/A Volume (cm) : 90.50% -2095.80% N/A % Reduction in Area: 90.50% -2096.40% N/A % Reduction in Volume: Full Thickness Without Exposed Full Thickness Without Exposed N/A Classification: Support Structures Support Structures Medium Medium N/A Exudate Amount: Serosanguineous Serosanguineous N/A Exudate Type: red, brown red, brown N/A Exudate Color: Distinct, outline attached Distinct, outline attached N/A Wound Margin: Large (67-100%) Large (67-100%) N/A Granulation Amount: Pink Red, Pink N/A Granulation Quality: N/A None Present (0%) N/A Necrotic Amount: Fat Layer (Subcutaneous Tissue): Yes Fat Layer (Subcutaneous Tissue): Yes N/A Exposed Structures: Fascia: No Fascia: No Tendon: No Tendon: No Muscle: No Muscle: No Joint: No Joint: No Bone: No Bone: No Small (1-33%) Small (1-33%) N/A Epithelialization: Compression Therapy Compression Therapy N/A Procedures Performed: Treatment Notes Wound #20 (Lower Leg) Wound Laterality: Bell, Circumferential Cleanser Soap and Water Discharge Instruction: May shower and wash wound with dial antibacterial soap and water prior to dressing change. Wound Cleanser Discharge Instruction: Cleanse the wound with wound cleanser prior to applying a clean dressing using gauze sponges, not tissue or cotton balls. Peri-Wound Care Topical Primary Dressing Maxorb Extra Calcium Alginate Dressing, 4x4  in Discharge Instruction: Apply calcium alginate to wound bed as instructed Secondary Dressing ABD Pad, 8x10 Discharge Instruction: Apply over primary dressing as directed. CarboFLEX Odor Control Dressing, 4x4 in Discharge Instruction: Apply over primary dressing as directed. Woven Gauze Sponge, Non-Sterile 4x4 in Discharge Instruction: Apply over primary dressing as directed. Secured With Compression Wrap ThreePress (3 layer compression wrap) Discharge Instruction: Apply three layer compression as directed. Compression Stockings Add-Ons Wound #7RR (Lower Leg) Wound Laterality: Left, Circumferential Cleanser Soap and Water Discharge Instruction: May shower and wash wound with dial antibacterial soap and water prior to dressing change. Wound Cleanser Discharge Instruction: Cleanse the wound with wound cleanser prior to applying a clean dressing using gauze sponges, not tissue or cotton balls. Peri-Wound Care Topical Primary Dressing Maxorb Extra Calcium Alginate Dressing, 4x4 in Discharge Instruction: Apply calcium alginate to wound bed as instructed Secondary Dressing ABD Pad, 8x10 Discharge Instruction: Apply over primary dressing as directed. CarboFLEX Odor Control Dressing, 4x4 in Discharge Instruction: Apply over primary dressing as directed. Woven Gauze Sponge, Non-Sterile 4x4 in Discharge Instruction: Apply over primary dressing as directed. Secured With Compression Wrap ThreePress (3 layer compression  wrap) Discharge Instruction: Apply three layer compression as directed. Compression Stockings Add-Ons Electronic Signature(s) Signed: 08/08/2021 4:23:48 PM By: Kalman Shan DO Signed: 08/08/2021 5:46:02 PM By: [name] Pilling RN, BSN Entered By: Kalman Shan on 08/08/2021 15:41:30 -------------------------------------------------------------------------------- Multi-Disciplinary Care Plan Details Patient Name: Date of Service: [name], [place]. 08/08/2021 1:30  PM Medical Record Number: [id number] Patient Account Number: [id number] Date of Birth/Sex: Treating RN: [birth year] (88 y.o. [name] Primary Care Hadessah Grennan: [name] Other Clinician: Referring [name]: Treating Obi Scrima/Extender: [name] in Treatment: 58 Multidisciplinary Care Plan reviewed with physician Active Inactive Venous Leg Ulcer Nursing Diagnoses: Knowledge deficit related to disease process and management Potential for venous Insuffiency (use before diagnosis confirmed) Goals: Patient will maintain optimal edema control Date Initiated: 11/01/2020 Target Resolution Date: 08/16/2021 Goal Status: Active Interventions: Assess peripheral edema status every visit. Compression as ordered Provide education on venous insufficiency Treatment Activities: Therapeutic compression applied : 11/01/2020 Notes: 12/27/20: Edema control ongoing 01/24/21: Edema control continues, using 3 layer compression Wound/Skin Impairment Nursing Diagnoses: Impaired tissue integrity Knowledge deficit related to ulceration/compromised skin integrity Goals: Patient/caregiver will verbalize understanding of skin care regimen Date Initiated: 06/11/2020 Target Resolution Date: 08/16/2021 Goal Status: Active Interventions: Assess patient/caregiver ability to obtain necessary supplies Assess patient/caregiver ability to perform ulcer/skin care regimen upon admission and as needed Assess ulceration(s) every visit Provide education on ulcer and skin care Notes: 10/11/20: Wound care regimen ongoing. 12/27/20: Wound care continues Electronic Signature(s) Signed: 08/08/2021 5:46:02 PM By: [name] Pilling RN, BSN Entered By: [name] on 08/08/2021 14:21:59 -------------------------------------------------------------------------------- Pain Assessment Details Patient Name: Date of Service: [name], [place]. 08/08/2021 1:30 PM Medical Record Number:  [id number] Patient Account Number: [id number] Date of Birth/Sex: Treating RN: 10-13-1925 (88 y.o. [name] Primary Care Londa Mackowski: [name] Other Clinician: Referring Cambri Plourde: Treating Nakeyia Menden/Extender: [name] in Treatment: 91 Active Problems Location of Pain Severity and Description of Pain Patient Has Paino No Site Locations Pain Management and Medication Current Pain Management: Electronic Signature(s) Signed: 08/08/2021 4:23:23 PM By: [name] Signed: 08/08/2021 5:46:02 PM By: [name] Pilling RN, BSN Entered By: [name] on 08/08/2021 13:44:32 -------------------------------------------------------------------------------- Patient/Caregiver Education Details Patient Name: Date of Service: [name], [place] 7/20/2023andnbsp1:30 PM Medical Record Number: [id number] Patient Account Number: [id number] Date of Birth/Gender: Treating RN: 02-May-1925 (88 y.o. [name] Primary Care Physician: [name] Other Clinician: Referring Physician: Treating Physician/Extender: [name] in Treatment: 31 Education Assessment Education Provided To: Patient Education Topics Provided Wound/Skin Impairment: Handouts: Skin Care Do's and Dont's Methods: Explain/Verbal Responses: Reinforcements needed Electronic Signature(s) Signed: 08/08/2021 5:46:02 PM By: [name] Pilling RN, BSN Entered By: [name] on 08/08/2021 14:22:11 -------------------------------------------------------------------------------- Wound Assessment Details Patient Name: Date of Service: [name], [place]. 08/08/2021 1:30 PM Medical Record Number: [id number] Patient Account Number: [id number] Date of Birth/Sex: Treating RN: 04-21-1925 (88 y.o. [name], [name] Primary Care Tanasia Budzinski: [name] Other Clinician: Referring Kanya Potteiger: Treating Vidal Lampkins/Extender: [name] in Treatment: 60 Wound Status Wound Number: 20 Primary Lymphedema Etiology: Wound Location: Bell, Circumferential Lower Leg Wound Open Wounding Event: Gradually Appeared Status: Date Acquired: 06/25/2021 Comorbid Anemia, Arrhythmia, Congestive Heart Failure, Hypertension, Weeks Of Treatment: 6 History: Colitis, Osteoarthritis Clustered Wound: Yes Photos Wound Measurements Length: (cm) 16 Width: (cm) 3 Depth: (cm) 0.1 Clustered Quantity: 15 Area: (cm) 37.699 Volume: (cm) 3.77 % Reduction in Area: 90.5% % Reduction in Volume: 90.5% Epithelialization:  Small (1-33%) Tunneling: No Undermining: No Wound Description Classification: Full Thickness Without Exposed Support Structures Wound Margin: Distinct, outline attached Exudate Amount: Medium Exudate Type: Serosanguineous Exudate Color: red, brown Foul Odor After Cleansing: No Slough/Fibrino No Wound Bed Granulation Amount: Large (67-100%) Exposed Structure Granulation Quality: Pink Fascia Exposed: No Fat Layer (Subcutaneous Tissue) Exposed: Yes Tendon Exposed: No Muscle Exposed: No Joint Exposed: No Bone Exposed: No Treatment Notes Wound #20 (Lower Leg) Wound Laterality: Bell, Circumferential Cleanser Soap and Water Discharge Instruction: May shower and wash wound with dial antibacterial soap and water prior to dressing change. Wound Cleanser Discharge Instruction: Cleanse the wound with wound cleanser prior to applying a clean dressing using gauze sponges, not tissue or cotton balls. Peri-Wound Care Topical Primary Dressing Maxorb Extra Calcium Alginate Dressing, 4x4 in Discharge Instruction: Apply calcium alginate to wound bed as instructed Secondary Dressing ABD Pad, 8x10 Discharge Instruction: Apply over primary dressing as directed. CarboFLEX Odor Control Dressing, 4x4 in Discharge Instruction: Apply over primary dressing as directed. Woven Gauze Sponge, Non-Sterile 4x4 in Discharge  Instruction: Apply over primary dressing as directed. Secured With Compression Wrap ThreePress (3 layer compression wrap) Discharge Instruction: Apply three layer compression as directed. Compression Stockings Add-Ons Electronic Signature(s) Signed: 08/08/2021 4:23:23 PM By: [name] Signed: 08/08/2021 5:46:02 PM By: [name] Pilling RN, BSN Entered By: [name] on 08/08/2021 14:33:41 -------------------------------------------------------------------------------- Wound Assessment Details Patient Name: Date of Service: [name], [place]. 08/08/2021 1:30 PM Medical Record Number: [id number] Patient Account Number: [id number] Date of Birth/Sex: Treating RN: [birth year] (88 y.o. [name], [name] Primary Care Idalie Canto: [name] Other Clinician: Referring Joslyne Marshburn: Treating Yoskar Murrillo/Extender: [name] in Treatment: 60 Wound Status Wound Number: 7RR Primary Malignant Wound Etiology: Wound Location: Left, Circumferential Lower Leg Wound Open Wounding Event: Blister Status: Date Acquired: 04/20/2020 Comorbid Anemia, Arrhythmia, Congestive Heart Failure, Hypertension, Weeks Of Treatment: 60 History: Colitis, Osteoarthritis Clustered Wound: Yes Photos Wound Measurements Length: (cm) 16.2 Width: (cm) 5.3 Depth: (cm) 0.1 Clustered Quantity: 15 Area: (cm) 67.434 Volume: (cm) 6.743 % Reduction in Area: -2095.8% % Reduction in Volume: -2096.4% Epithelialization: Small (1-33%) Tunneling: No Undermining: No Wound Description Classification: Full Thickness Without Exposed Support Structures Wound Margin: Distinct, outline attached Exudate Amount: Medium Exudate Type: Serosanguineous Exudate Color: red, brown Foul Odor After Cleansing: No Slough/Fibrino No Wound Bed Granulation Amount: Large (67-100%) Exposed Structure Granulation Quality: Red, Pink Fascia Exposed: No Necrotic Amount: None Present (0%) Fat Layer  (Subcutaneous Tissue) Exposed: Yes Tendon Exposed: No Muscle Exposed: No Joint Exposed: No Bone Exposed: No Treatment Notes Wound #7RR (Lower Leg) Wound Laterality: Left, Circumferential Cleanser Soap and Water Discharge Instruction: May shower and wash wound with dial antibacterial soap and water prior to dressing change. Wound Cleanser Discharge Instruction: Cleanse the wound with wound cleanser prior to applying a clean dressing using gauze sponges, not tissue or cotton balls. Peri-Wound Care Topical Primary Dressing Maxorb Extra Calcium Alginate Dressing, 4x4 in Discharge Instruction: Apply calcium alginate to wound bed as instructed Secondary Dressing ABD Pad, 8x10 Discharge Instruction: Apply over primary dressing as directed. CarboFLEX Odor Control Dressing, 4x4 in Discharge Instruction: Apply over primary dressing as directed. Woven Gauze Sponge, Non-Sterile 4x4 in Discharge Instruction: Apply over primary dressing as directed. Secured With Compression Wrap ThreePress (3 layer compression wrap) Discharge Instruction: Apply three layer compression as directed. Compression Stockings Add-Ons Electronic Signature(s) Signed: 08/08/2021 4:23:23 PM By: [name] Signed: 08/08/2021 5:46:02 PM By: [name] Pilling RN, BSN Entered By: [name] on 08/08/2021  14:34:13 -------------------------------------------------------------------------------- Vitals Details Patient Name: Date of Service: [name], [place]. 08/08/2021 1:30 PM Medical Record Number: [id number] Patient Account Number: [id number] Date of Birth/Sex: Treating RN: July 24, 1925 (88 y.o. [name], [name] Primary Care Arleth Mccullar: [name] Other Clinician: Referring Kaitlyne Friedhoff: Treating [name] Drew/Extender: [name] in Treatment: 60 Vital Signs Time Taken: 13:42 Temperature (F): 97.9 Height (in): 65 Pulse (bpm): 77 Weight (lbs): 163 Respiratory Rate  (breaths/min): 22 Body Mass Index (BMI): 27.1 Blood Pressure (mmHg): 154/88 Reference Range: 80 - 120 mg / dl Electronic Signature(s) Signed: 08/08/2021 4:23:23 PM By: [name] Entered By: [name] on 08/08/2021 13:44:27

## 2021-08-09 DIAGNOSIS — I10 Essential (primary) hypertension: Secondary | ICD-10-CM | POA: Diagnosis not present

## 2021-08-09 DIAGNOSIS — I5030 Unspecified diastolic (congestive) heart failure: Secondary | ICD-10-CM | POA: Diagnosis not present

## 2021-08-12 ENCOUNTER — Other Ambulatory Visit: Payer: Self-pay

## 2021-08-12 DIAGNOSIS — I5030 Unspecified diastolic (congestive) heart failure: Secondary | ICD-10-CM | POA: Diagnosis not present

## 2021-08-12 DIAGNOSIS — N182 Chronic kidney disease, stage 2 (mild): Secondary | ICD-10-CM | POA: Diagnosis not present

## 2021-08-12 DIAGNOSIS — D649 Anemia, unspecified: Secondary | ICD-10-CM | POA: Diagnosis not present

## 2021-08-12 DIAGNOSIS — Z7689 Persons encountering health services in other specified circumstances: Secondary | ICD-10-CM | POA: Diagnosis not present

## 2021-08-13 ENCOUNTER — Other Ambulatory Visit: Payer: Self-pay

## 2021-08-13 DIAGNOSIS — I5030 Unspecified diastolic (congestive) heart failure: Secondary | ICD-10-CM | POA: Diagnosis not present

## 2021-08-14 ENCOUNTER — Encounter (HOSPITAL_BASED_OUTPATIENT_CLINIC_OR_DEPARTMENT_OTHER): Payer: Medicare Other | Admitting: Internal Medicine

## 2021-08-14 ENCOUNTER — Encounter: Payer: Self-pay | Admitting: *Deleted

## 2021-08-14 DIAGNOSIS — F419 Anxiety disorder, unspecified: Secondary | ICD-10-CM | POA: Diagnosis not present

## 2021-08-14 DIAGNOSIS — I5030 Unspecified diastolic (congestive) heart failure: Secondary | ICD-10-CM | POA: Diagnosis not present

## 2021-08-15 ENCOUNTER — Encounter (HOSPITAL_BASED_OUTPATIENT_CLINIC_OR_DEPARTMENT_OTHER): Payer: Medicare Other | Admitting: Internal Medicine

## 2021-08-15 DIAGNOSIS — L97812 Non-pressure chronic ulcer of other part of right lower leg with fat layer exposed: Secondary | ICD-10-CM

## 2021-08-15 DIAGNOSIS — I87313 Chronic venous hypertension (idiopathic) with ulcer of bilateral lower extremity: Secondary | ICD-10-CM

## 2021-08-15 DIAGNOSIS — C4492 Squamous cell carcinoma of skin, unspecified: Secondary | ICD-10-CM | POA: Diagnosis not present

## 2021-08-15 DIAGNOSIS — R531 Weakness: Secondary | ICD-10-CM | POA: Diagnosis not present

## 2021-08-15 DIAGNOSIS — F419 Anxiety disorder, unspecified: Secondary | ICD-10-CM | POA: Diagnosis not present

## 2021-08-15 DIAGNOSIS — E039 Hypothyroidism, unspecified: Secondary | ICD-10-CM | POA: Diagnosis not present

## 2021-08-15 DIAGNOSIS — I5030 Unspecified diastolic (congestive) heart failure: Secondary | ICD-10-CM | POA: Diagnosis not present

## 2021-08-15 DIAGNOSIS — I5032 Chronic diastolic (congestive) heart failure: Secondary | ICD-10-CM | POA: Diagnosis not present

## 2021-08-15 DIAGNOSIS — L97822 Non-pressure chronic ulcer of other part of left lower leg with fat layer exposed: Secondary | ICD-10-CM

## 2021-08-15 DIAGNOSIS — I890 Lymphedema, not elsewhere classified: Secondary | ICD-10-CM | POA: Diagnosis not present

## 2021-08-15 NOTE — Progress Notes (Signed)
[name] ([id number]) , Visit Report for 08/15/2021 Arrival Information Details Patient Name: Date of Service: [name] ETT, [name]. 08/15/2021 1:30 PM Medical Record Number: [id number] Patient Account Number: [id number] Date of Birth/Sex: Treating RN: [name] Other Clinician: Referring [name]: Treating [name] in Treatment: 42 Visit Information History Since Last Visit Added or deleted any medications: No Patient Arrived: Walker Any new allergies or adverse reactions: No Arrival Time: 13:55 Had a fall or experienced change in No Accompanied By: self activities of daily living that may affect Transfer Assistance: None risk of falls: Patient Identification Verified: Yes Signs or symptoms of abuse/neglect since last visito No Secondary Verification Process Completed: Yes Hospitalized since last visit: No Patient Requires Transmission-Based No Implantable device outside of the clinic excluding No Precautions: cellular tissue based products placed in the center Patient Has Alerts: Yes since last visit: Patient Alerts: R ABI = Non compressible Has Dressing in Place as Prescribed: Yes L ABI = Non compressible Has Compression in Place as Prescribed: Yes Pain Present Now: Yes Electronic Signature(s) Signed: 08/15/2021 4:23:35 PM By: [name] Pilling RN, BSN Entered By: [name] 13:59:59 -------------------------------------------------------------------------------- Compression Therapy Details Patient Name: Date of Service: [name] ETT, [name] 08/15/2021 1:30 PM Medical Record Number: [id number] Patient Account Number: [id number] Date of Birth/Sex: Treating RN: 1925/12/15 (88 y.o. [name] Primary Care Kadra Kohan: [name] Other Clinician: Referring [name]: Treating [name] in Treatment: 62 Compression Therapy Performed for Wound Assessment: Wound #20 Bell,Circumferential Lower Leg Performed By: Clinician [name] Pilling, RN Compression Type: Three Layer Post Procedure Diagnosis Same as Pre-procedure Electronic Signature(s) Signed: 08/15/2021 4:23:35 PM By: [name] Pilling RN, BSN Entered By: [name] Bell -------------------------------------------------------------------------------- Compression Therapy Details Patient Name: Date of Service: [name] ETT, [name] 08/15/2021 1:30 PM Medical Record Number: [id number] Patient Account Number: [id number] Date of Birth/Sex: Treating RN: 1925-12-18 (88 y.o. [name] Primary Care Shontelle Muska: [name] Other Clinician: Referring [name]: Treating [name] in Treatment: 76 Compression Therapy Performed for Wound Assessment: Wound #7RR Left,Circumferential Lower Leg Performed By: Clinician [name] Pilling, RN Compression Type: Three Layer Post Procedure Diagnosis Same as Pre-procedure Electronic Signature(s) Signed: 08/15/2021 4:23:35 PM By: [name] Pilling RN, BSN Entered By: [name] Bell -------------------------------------------------------------------------------- Encounter Discharge Information Details Patient Name: Date of Service: [name] ETT, [name] 08/15/2021 1:30 PM Medical Record Number: [id number] Patient Account Number: [id number] Date of Birth/Sex: Treating RN: [birth year] (88 y.o. [name] Primary Care Eagan Shifflett: [name] Other Clinician: Referring [name]: Treating [name] in Treatment: 25 Encounter Discharge Information Items Discharge Condition: Stable Ambulatory Status: Walker Discharge Destination: Home Transportation: Private Auto Accompanied By: self Schedule Follow-up Appointment:  Yes Clinical Summary of Care: Electronic Signature(s) Signed: 08/15/2021 4:23:35 PM By: [name] Pilling RN, BSN Entered By: [name] 14:33:45 -------------------------------------------------------------------------------- Lower Extremity Assessment Details Patient Name: Date of Service: [name] ETT, [name] 08/15/2021 1:30 PM Medical Record Number: [id number] Patient Account Number: [id number] Date of Birth/Sex: Treating RN: [birth year] (88 y.o. [name] Primary Care Keyera Hattabaugh: [name] Other Clinician: Referring [name]: Treating [name] in Treatment: 25 Edema Assessment Assessed: [Left: Yes] [Bell: Yes] Edema: [Left: Yes] [Bell: Yes] Calf Left: Bell: Point of Measurement: 30 cm From Medial Instep 31 cm  32 cm Ankle Left: Bell: Point of Measurement: 9 cm From Medial Instep 22 cm 22 cm Vascular Assessment Pulses: Dorsalis Pedis Palpable: [Left:Yes] [Bell:Yes] Electronic Signature(s) Signed: 08/15/2021 4:23:35 PM By: [name] Pilling RN, BSN Entered By: [name] 14:08:52 -------------------------------------------------------------------------------- Multi Wound Chart Details Patient Name: Date of Service: [name] ETT, [name] 08/15/2021 1:30 PM Medical Record Number: [id number] Patient Account Number: [id number] Date of Birth/Sex: Treating RN: 1925-09-01 (88 y.o. [name], [name] Primary Care Geroldine Esquivias: [name] Other Clinician: Referring [name]: Treating [name] in Treatment: 48 Vital Signs Height(in): 65 Pulse(bpm): 29 Weight(lbs): 163 Blood Pressure(mmHg): 130/76 Body Mass Index(BMI): 27.1 Temperature(F): 97.6 Respiratory Rate(breaths/min): 20 Photos: [N/A:N/A] Bell, Circumferential Lower Leg Left, Circumferential Lower Leg N/A Wound Location: Gradually Appeared Blister N/A Wounding  Event: Lymphedema Malignant Wound N/A Primary Etiology: Anemia, Arrhythmia, Congestive Heart Anemia, Arrhythmia, Congestive Heart N/A Comorbid History: Failure, Hypertension, Colitis, Failure, Hypertension, Colitis, Osteoarthritis Osteoarthritis 06/25/2021 04/20/2020 N/A Date Acquired: 7 61 N/A Weeks of Treatment: Open Open N/A Wound Status: No Yes N/A Wound Recurrence: Yes Yes N/A Clustered Wound: 6 2 N/A Clustered Quantity: 32x22x0.1 3x9x0.1 N/A Measurements L x W x D (cm) 552.92 21.206 N/A A (cm) : rea 55.292 2.121 N/A Volume (cm) : -39.10% -590.50% N/A % Reduction in Area: -39.10% -590.90% N/A % Reduction in Volume: Full Thickness Without Exposed Full Thickness Without Exposed N/A Classification: Support Structures Support Structures Medium Medium N/A Exudate Amount: Serosanguineous Serosanguineous N/A Exudate Type: red, brown red, brown N/A Exudate Color: Distinct, outline attached Distinct, outline attached N/A Wound Margin: Large (67-100%) Large (67-100%) N/A Granulation Amount: Pink Red, Pink N/A Granulation Quality: N/A None Present (0%) N/A Necrotic Amount: Fat Layer (Subcutaneous Tissue): Yes Fat Layer (Subcutaneous Tissue): Yes N/A Exposed Structures: Fascia: No Fascia: No Tendon: No Tendon: No Muscle: No Muscle: No Joint: No Joint: No Bone: No Bone: No Medium (34-66%) Large (67-100%) N/A Epithelialization: Compression Therapy Compression Therapy N/A Procedures Performed: Treatment Notes Wound #20 (Lower Leg) Wound Laterality: Bell, Circumferential Cleanser Soap and Water Discharge Instruction: May shower and wash wound with dial antibacterial soap and water prior to dressing change. Wound Cleanser Discharge Instruction: Cleanse the wound with wound cleanser prior to applying a clean dressing using gauze sponges, not tissue or cotton balls. Peri-Wound Care Topical Primary Dressing Maxorb Extra Calcium Alginate Dressing, 4x4  in Discharge Instruction: Apply calcium alginate to wound bed as instructed MediHoney Gel, tube 1.5 (oz) Discharge Instruction: Apply to wound bed as instructed Secondary Dressing ABD Pad, 8x10 Discharge Instruction: Apply over primary dressing as directed. CarboFLEX Odor Control Dressing, 4x4 in Discharge Instruction: Apply over primary dressing as directed. Woven Gauze Sponge, Non-Sterile 4x4 in Discharge Instruction: Apply over primary dressing as directed. Secured With Compression Wrap ThreePress (3 layer compression wrap) Discharge Instruction: Apply three layer compression as directed. Compression Stockings Add-Ons Wound #7RR (Lower Leg) Wound Laterality: Left, Circumferential Cleanser Soap and Water Discharge Instruction: May shower and wash wound with dial antibacterial soap and water prior to dressing change. Wound Cleanser Discharge Instruction: Cleanse the wound with wound cleanser prior to applying a clean dressing using gauze sponges, not tissue or cotton balls. Peri-Wound Care Topical Primary Dressing Maxorb Extra Calcium Alginate Dressing, 4x4 in Discharge Instruction: Apply calcium alginate to wound bed as instructed MediHoney Gel, tube 1.5 (oz) Discharge Instruction: Apply to wound bed as instructed Secondary Dressing ABD Pad, 8x10 Discharge Instruction: Apply over primary dressing as directed. CarboFLEX Odor Control Dressing, 4x4 in Discharge Instruction:  Apply over primary dressing as directed. Woven Gauze Sponge, Non-Sterile 4x4 in Discharge Instruction: Apply over primary dressing as directed. Secured With Compression Wrap ThreePress (3 layer compression wrap) Discharge Instruction: Apply three layer compression as directed. Compression Stockings Add-Ons Electronic Signature(s) Signed: 08/15/2021 4:01:46 PM By: Kalman Shan DO Signed: 08/15/2021 4:23:35 PM By: [name] Pilling RN, BSN Entered By: Kalman Shan on Bell  14:50:49 -------------------------------------------------------------------------------- Multi-Disciplinary Care Plan Details Patient Name: Date of Service: [name] ETT, [place]. 08/15/2021 1:30 PM Medical Record Number: [id number] Patient Account Number: [id number] Date of Birth/Sex: Treating RN: Sep 30, 1925 (88 y.o. [name] Primary Care Jaymin Waln: [name] Other Clinician: Referring Kayleigh Broadwell: Treating Tyisha Cressy/Extender: [name] in Treatment: 55 Multidisciplinary Care Plan reviewed with physician Active Inactive Venous Leg Ulcer Nursing Diagnoses: Knowledge deficit related to disease process and management Potential for venous Insuffiency (use before diagnosis confirmed) Goals: Patient will maintain optimal edema control Date Initiated: 11/01/2020 Target Resolution Date: 09/20/2021 Goal Status: Active Interventions: Assess peripheral edema status every visit. Compression as ordered Provide education on venous insufficiency Treatment Activities: Therapeutic compression applied : 11/01/2020 Notes: 12/27/20: Edema control ongoing 01/24/21: Edema control continues, using 3 layer compression Wound/Skin Impairment Nursing Diagnoses: Impaired tissue integrity Knowledge deficit related to ulceration/compromised skin integrity Goals: Patient/caregiver will verbalize understanding of skin care regimen Date Initiated: 06/11/2020 Target Resolution Date: 09/21/2021 Goal Status: Active Interventions: Assess patient/caregiver ability to obtain necessary supplies Assess patient/caregiver ability to perform ulcer/skin care regimen upon admission and as needed Assess ulceration(s) every visit Provide education on ulcer and skin care Notes: 10/11/20: Wound care regimen ongoing. 12/27/20: Wound care continues Electronic Signature(s) Signed: 08/15/2021 4:23:35 PM By: [name] Pilling RN, BSN Entered By: [name]  14:21:45 -------------------------------------------------------------------------------- Pain Assessment Details Patient Name: Date of Service: [name] ETT, [place]. 08/15/2021 1:30 PM Medical Record Number: [id number] Patient Account Number: [id number] Date of Birth/Sex: Treating RN: 1925-06-26 (88 y.o. [name] Primary Care Keyshawna Prouse: [name] Other Clinician: Referring Heddy Vidana: Treating Benjerman Molinelli/Extender: [name] in Treatment: 52 Active Problems Location of Pain Severity and Description of Pain Patient Has Paino Yes Site Locations Pain Location: Generalized Pain Rate the pain. Current Pain Level: 3 Pain Management and Medication Current Pain Management: Medication: No Cold Application: No Rest: No Massage: No Activity: No T.E.N.S.: No Heat Application: No Leg drop or elevation: No Is the Current Pain Management Adequate: Adequate How does your wound impact your activities of daily livingo Sleep: No Bathing: No Appetite: No Relationship With Others: No Bladder Continence: No Emotions: No Bowel Continence: No Work: No Toileting: No Drive: No Dressing: No Hobbies: No Engineer, maintenance) Signed: 08/15/2021 4:23:35 PM By: [name] Pilling RN, BSN Entered By: [name] 14:00:23 -------------------------------------------------------------------------------- Patient/Caregiver Education Details Patient Name: Date of Service: [name] ETT, Renie A. 7/27/2023andnbsp1:30 PM Medical Record Number: [id number] Patient Account Number: [id number] Date of Birth/Gender: Treating RN: 04-Feb-1925 (88 y.o. [name] Primary Care Physician: [name] Other Clinician: Referring Physician: Treating Physician/Extender: [name] in Treatment: 74 Education Assessment Education Provided To: Patient Education Topics Provided Wound/Skin Impairment: Handouts:  Skin Care Do's and Dont's Methods: Explain/Verbal Responses: Reinforcements needed Electronic Signature(s) Signed: 08/15/2021 4:23:35 PM By: [name] Pilling RN, BSN Entered By: [name] 14:22:10 -------------------------------------------------------------------------------- Wound Assessment Details Patient Name: Date of Service: [name] ETT, [place]. 08/15/2021 1:30 PM Medical Record Number: [id number] Patient Account Number: [id number] Date of Birth/Sex: Treating RN: 12/25/1925 (88  y.o. [name], [name] Primary Care Penny Frisbie: [name] Other Clinician: Referring [place] Pettengill: Treating Kastin Cerda/Extender: [name] in Treatment: 66 Wound Status Wound Number: 20 Primary Lymphedema Etiology: Wound Location: Bell, Circumferential Lower Leg Wound Open Wounding Event: Gradually Appeared Status: Date Acquired: 06/25/2021 Comorbid Anemia, Arrhythmia, Congestive Heart Failure, Hypertension, Weeks Of Treatment: 7 History: Colitis, Osteoarthritis Clustered Wound: Yes Photos Wound Measurements Length: (cm) 32 Width: (cm) 22 Depth: (cm) 0.1 Clustered Quantity: 6 Area: (cm) 552.92 Volume: (cm) 55.292 % Reduction in Area: -39.1% % Reduction in Volume: -39.1% Epithelialization: Medium (34-66%) Tunneling: No Undermining: No Wound Description Classification: Full Thickness Without Exposed Support Structures Wound Margin: Distinct, outline attached Exudate Amount: Medium Exudate Type: Serosanguineous Exudate Color: red, brown Foul Odor After Cleansing: No Slough/Fibrino No Wound Bed Granulation Amount: Large (67-100%) Exposed Structure Granulation Quality: Pink Fascia Exposed: No Fat Layer (Subcutaneous Tissue) Exposed: Yes Tendon Exposed: No Muscle Exposed: No Joint Exposed: No Bone Exposed: No Treatment Notes Wound #20 (Lower Leg) Wound Laterality: Bell, Circumferential Cleanser Soap and Water Discharge  Instruction: May shower and wash wound with dial antibacterial soap and water prior to dressing change. Wound Cleanser Discharge Instruction: Cleanse the wound with wound cleanser prior to applying a clean dressing using gauze sponges, not tissue or cotton balls. Peri-Wound Care Topical Primary Dressing Maxorb Extra Calcium Alginate Dressing, 4x4 in Discharge Instruction: Apply calcium alginate to wound bed as instructed MediHoney Gel, tube 1.5 (oz) Discharge Instruction: Apply to wound bed as instructed Secondary Dressing ABD Pad, 8x10 Discharge Instruction: Apply over primary dressing as directed. CarboFLEX Odor Control Dressing, 4x4 in Discharge Instruction: Apply over primary dressing as directed. Woven Gauze Sponge, Non-Sterile 4x4 in Discharge Instruction: Apply over primary dressing as directed. Secured With Compression Wrap ThreePress (3 layer compression wrap) Discharge Instruction: Apply three layer compression as directed. Compression Stockings Add-Ons Electronic Signature(s) Signed: 08/15/2021 4:23:35 PM By: [name] Pilling RN, BSN Entered By: [name] 14:13:15 -------------------------------------------------------------------------------- Wound Assessment Details Patient Name: Date of Service: [name] ETT, [place]. 08/15/2021 1:30 PM Medical Record Number: [id number] Patient Account Number: [id number] Date of Birth/Sex: Treating RN: [birth year] (88 y.o. [name], [name] Primary Care Nyeema Want: [name] Other Clinician: Referring Chamia Schmutz: Treating Elfego Giammarino/Extender: [name] in Treatment: 57 Wound Status Wound Number: 7RR Primary Malignant Wound Etiology: Wound Location: Left, Circumferential Lower Leg Wound Open Wounding Event: Blister Status: Date Acquired: 04/20/2020 Comorbid Anemia, Arrhythmia, Congestive Heart Failure, Hypertension, Weeks Of Treatment: 61 History: Colitis,  Osteoarthritis Clustered Wound: Yes Photos Wound Measurements Length: (cm) 3 Width: (cm) 9 Depth: (cm) 0.1 Clustered Quantity: 2 Area: (cm) 21.206 Volume: (cm) 2.121 % Reduction in Area: -590.5% % Reduction in Volume: -590.9% Epithelialization: Large (67-100%) Tunneling: No Undermining: No Wound Description Classification: Full Thickness Without Exposed Support Structures Wound Margin: Distinct, outline attached Exudate Amount: Medium Exudate Type: Serosanguineous Exudate Color: red, brown Foul Odor After Cleansing: No Slough/Fibrino No Wound Bed Granulation Amount: Large (67-100%) Exposed Structure Granulation Quality: Red, Pink Fascia Exposed: No Necrotic Amount: None Present (0%) Fat Layer (Subcutaneous Tissue) Exposed: Yes Tendon Exposed: No Muscle Exposed: No Joint Exposed: No Bone Exposed: No Treatment Notes Wound #7RR (Lower Leg) Wound Laterality: Left, Circumferential Cleanser Soap and Water Discharge Instruction: May shower and wash wound with dial antibacterial soap and water prior to dressing change. Wound Cleanser Discharge Instruction: Cleanse the wound with wound cleanser prior to applying a clean dressing using gauze sponges, not tissue or cotton balls. Peri-Wound Care Topical  Primary Dressing Maxorb Extra Calcium Alginate Dressing, 4x4 in Discharge Instruction: Apply calcium alginate to wound bed as instructed MediHoney Gel, tube 1.5 (oz) Discharge Instruction: Apply to wound bed as instructed Secondary Dressing ABD Pad, 8x10 Discharge Instruction: Apply over primary dressing as directed. CarboFLEX Odor Control Dressing, 4x4 in Discharge Instruction: Apply over primary dressing as directed. Woven Gauze Sponge, Non-Sterile 4x4 in Discharge Instruction: Apply over primary dressing as directed. Secured With Compression Wrap ThreePress (3 layer compression wrap) Discharge Instruction: Apply three layer compression as directed. Compression  Stockings Add-Ons Electronic Signature(s) Signed: 08/15/2021 4:23:35 PM By: [name] Pilling RN, BSN Entered By: [name] 14:13:31 -------------------------------------------------------------------------------- Vitals Details Patient Name: Date of Service: [name] ETT, [place]. 08/15/2021 1:30 PM Medical Record Number: [id number] Patient Account Number: [id number] Date of Birth/Sex: Treating RN: 1925-08-22 (88 y.o. [name], [name] Primary Care Ethel [name]: [name] Other Clinician: Referring Renezmae Canlas: Treating Jaycen Vercher/Extender: [name] in Treatment: 69 Vital Signs Time Taken: 14:00 Temperature (F): 97.6 Height (in): 65 Pulse (bpm): 73 Weight (lbs): 163 Respiratory Rate (breaths/min): 20 Body Mass Index (BMI): 27.1 Blood Pressure (mmHg): 130/76 Reference Range: 80 - 120 mg / dl Electronic Signature(s) Signed: 08/15/2021 4:23:35 PM By: [name] Pilling RN, BSN Entered By: [name] 14:00:12

## 2021-08-15 NOTE — Progress Notes (Signed)
[name] ([id number]) , Visit Report for 08/15/2021 Chief Complaint Document Details Patient Name: Date of Service: [name], [place]. 08/15/2021 1:30 PM Medical Record Number: [id number] Patient Account Number: [id number] Date of Birth/Sex: Treating RN: [birth year] (88 y.o. [name] Primary Care Provider: [name] Other Clinician: Referring Provider: Treating Provider/Extender: [name] in Treatment: 83 Information Obtained from: Patient Chief Complaint Bilateral lower extremity wounds that have been biopsied and positive for squamous cell carcinoma Bilateral lower extremity wounds due to chronic venous insufficiency/lymphedema Electronic Signature(s) Signed: 08/15/2021 4:01:46 PM By: Kalman Shan DO Entered By: Kalman Shan on 08/15/2021 14:50:57 -------------------------------------------------------------------------------- HPI Details Patient Name: Date of Service: [name], [name] 08/15/2021 1:30 PM Medical Record Number: [id number] Patient Account Number: [id number] Date of Birth/Sex: Treating RN: 1925/11/09 (88 y.o. [name] Primary Care Provider: [name] Other Clinician: Referring Provider: Treating Provider/Extender: [name] in Treatment: 57 History of Present Illness Location: left leg HPI Description: Admission 5/23 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 She states that the wound on the right has been present for a year. The 1 on the left has opened up 1 month ago. She is followed with oncology for this issue as she had biopsies that showed squamous cell carcinoma. She is also seeing radiation oncology for treatment  options. She presents today because she would like for her wounds to be healed by [place]. She currently denies signs of infection. 6/1; patient presents for 1 week follow-up. She states she has tolerated the leg wraps well. She states these do not bother her and is happy to continue with them. She is scheduled to see her oncologist today to go over treatment options for the bilateral lower extremity squamous cell carcinoma. Radiation is currently not a recommended option. Patient states she overall feels well. 6/22; patient presents for 3-week follow-up. She has tolerated the wraps well until her last wrap where she states they were uncomfortable. She attributes this to the home health nurse. She denies signs of infection. She has started her first treatment of antibody infusions for her Bilateral lower extremity squamous cell carcinoma. She has no complaints today. 7/21; patient presents for 1 month follow-up. Unfortunately she has not had good experience with her wrap changes with home health. She would like to do her own dressing changes. She continues to do her antibody infusions. She denies signs of infection. 7/28; patient presents for 1 week follow-up. At last clinic visit she was switched to daily dressing changes due to issues with the wrap and home health placing them. Unfortunately she has developed weeping to her legs bilaterally. She would like to be placed in wraps today. She would also like to follow with [place] weekly for wrap changes instead of having home health change them. She denies signs of infection. 8/4; patient presents for 1 week follow-up. She has tolerated the Kerlix/Coban wraps well. She no longer has weeping to her legs. She took the wrap off 1 day before coming in to be able to take a shower. She has no issues or complaints today. She denies signs of infection. 8/18; patient presents for follow-up. Patient has tolerated the wraps well. She brought her Velcro compression wraps  today. She has no  issues or complaints today. She had her chemotherapy infusion yesterday without issues. She denies signs of infection. 8/25; patient presents for follow-up. She used her juxta lite compressions for the past week. It is unclear if she is able to put these on correctly since she states she has a hard time getting them to look right. She reports 2 open wounds. She currently denies signs of infection. 9/1; patient presents for 1 week follow-up. She has 1 open wound. She tolerated the compression wraps well. She currently denies signs of infection. 9/8; patient presents for 1 week follow-up. She has 2 open wounds 1 on each leg. She has tolerated the compression wrap well. She currently denies signs of infection. 9/15; patient presents for 1 week follow-up. She now has 3 wounds. 2 on the left and 1 on the right. She continues to tolerate the compression wrap well. She currently denies signs of infection. She has obtained furosemide by her primary care physician and would like to discuss when to take this. 9/22; patient presents for 1 week follow-up. She has scattered wounds on her lower extremities bilaterally. She did take furosemide twice in the past week. She does not recall having to urinate more frequently. She tolerated the 3 layer compression well. She denies signs of infection. 10/13; patient has not been here recently because of [place]. Apparently the facility was only putting gauze on her legs. This is a patient I do not normally see. 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 Ronnald Ramp at [facility] dermatology. She is quite convincing that she did not have radiation to her lower legs. It is likely she also has significant chronic venous insufficiency stasis dermatitis. We have been using silver alginate under kerlix Coban. She has obvious open areas medially on the right and areas on the left. She also has  areas of extensive dry flaking adherent areas on the right and to a lesser extent on the left anterior. Nodular areas on the left lateral lower leg left posterior calf and right mid calf medially. 10/21; patient presents for follow-up. She has no issues or complaints today. She has tolerated the 3 layer compression well bilaterally. She denies signs of infection. 10/27; patient presents for follow-up. She continues to tolerate the 3 layer compression wrap well. She denies signs of infection. 11/30; patient presents for follow-up. She has no issues or complaints today. 11/10; patient arrived in clinic today for nurse visit accompanied by her daughter from [place]. The daughter had multiple questions so we turned this into doctors visit. Apparently after the last time I saw this woman in October she went to see Dr. Ronnald Ramp but he did not take the wraps off. She previously was treated with Cemiplimab for her squamous cell carcinoma. She did not receive radiation to her legs. I had wanted Dr. Ronnald Ramp to look at this because of the exceptionally damaged skin on her lower legs bilaterally. She has odd looking wounds on the left medial lower leg also extending posteriorly which we have not made a lot of progress on. But she also has a raised hyperkeratotic nodules on the right anterior tibial area plaques on the left medial lower thigh. These are not areas that are under compression. We have been using silver alginate on any open areas Liberal TCA under 3 layer compression. 11/17; patient presents for follow-up. She had her wraps taken off yesterday for her dermatology appointment. She reports excessive  weeping to her legs bilaterally after the wraps were taken off. She currently denies signs of infection. 12/12/2020 upon evaluation today patient appears to be doing about the same in regard to her wounds. She was actually started on Cipro after having biopsies apparently at her dermatology clinic she does not  have the results back from the actual biopsy but the culture did return and apparently they placed her on the Cipro she started that just this morning. Other than that her legs appear to be doing okay at this time. 12/1; patient presents for follow-up. She has no issues or complaints today. She has started Lasix 40 mg daily to help with her bilateral leg swelling. 12/8; patient presents for follow-up. She has no issues or complaints today. 12/15; patient presents for 1 week follow-up. She has no issues or complaints today. 12/22; patient presents for follow-up. She has no issues or complaints today. 1/5; patient presents for follow-up. She has no issues or complaints today. She has tolerated the compression wraps well. 1/12; patient presents for follow-up. She is tolerated the compression wrap well and has no issues or complaints today. She denies signs of infection. 1/19; patient presents for follow-up. She took the compression wraps off 1 to 2 days ago to take a shower and did not have compression wraps replaced. She reports increased blistering throughout her legs bilaterally. She currently denies signs of infection. 1/26; patient presents for follow-up. She has no issues or complaints today. She tolerated the compression wraps well. She has not heard from oncology to schedule an appointment. 2/2; patient presents for follow-up. She has no issues or complaints today. She continues to tolerate the compression wrap well. 2/16; patient presents for follow-up. She has no issues or complaints today. 2/23; patient presents for follow-up. She has no issues or complaints today. Tomorrow she has an appointment with oncology to look at the suspicious lesion on her right lower extremity. She currently denies signs of infection. 3/2; patient presents for follow-up. She has been using her juxta light compression to the right lower extremity however there has been increased swelling and opening of wounds  throughout the legs with weeping. She currently denies signs of infection. She had no issues with the compression wrap to the left lower extremity. 3/16; patient presents for follow-up. She has no issues or complaints today. She states that she has been restarted on infusions for her squamous cell carcinoma of her legs. She has also been increased on Lasix to 40 mg twice daily. She has noticed a decrease in swelling to her legs. 3/23; patient presents for follow-up. She starts her second infusion next week for her squamous cell carcinoma lesions to her legs. She reports no issues with the compression wraps. 3/30; patient presents for follow-up. She has no issues or complaints today. 4/6; patient presents for follow-up. Her left lower extremity wounds have healed. She has her juxta light compression with her today. She has no issues or complaints today. 4/13; this is a patient we actually discharged to juxta lite stockings on the left leg the last time she was here. She came in on Monday for [place] to look at the leg and a nurse visit. She had massive swelling and numerous blisters and wound reopening. We put her back in 3 layer compression although I did not generate a note. Silver alginate on the wounded areas. We have also been doing the same on the right. In the meantime she is obtained her external compression pumps that were  previously ordered and she started to use them. She has skin cancers that are obvious on the right leg x2 her appointment with Dr. Jarome Matin of dermatology is on May 5.. She is also receiving weekly chemotherapy for recurrent presumably squamous cell carcinomas apparently has had 2 treatments 4/20; patient presents for follow-up. She has her new juxta lite compressions today. She continues to have open wounds to the left lower extremity. She has follow-up with dermatology in 2 weeks. She continues to have IV infusions for her squamous cell carcinoma lesions on her legs. 4/28;  the patient has bilateral lower extremity wounds predominantly on the right lateral upper leg, right anterior lower leg which in itself is probably a recurrent cancer as well as the left lateral lower leg. She has recurrent squamous cell carcinoma currently being treated with an IV infusion. She also has scattered nodules on her upper lower legs and thighs I am not certain of all of this is felt to be malignant as well We have been using silver alginate on the open areas wrapping her legs. She also has her external compression pumps at home but I am not clear that she is going to use these 5/2; patient presents for follow-up. She has not been taking her diuretics as prescribed. She sees Dr. Dian Situ, dermatology at the end of the week. She tolerated the compression wraps well today. She has her juxta lite compressions. 5/8; patient presents for follow-up. She saw Dr. Dian Situ, dermatology and she has nothing to report on. She has been using her juxta lite compression to the right lower extremity. She has tolerated the compression wrap well in the left lower extremity. She has not been taking her diuretic. She is scheduled to continue her infusions for her squamous cell carcinoma on her legs. 5/18; patient presents for follow-up. We wrapped her in 3 layer compression at last clinic visit. She has no issues or complaints today. 5/22; patient presents for follow-up. She has been wrapped in 3 layer compression bilaterally to her lower extremities over the past week. She has been scratching at the top of the wrap and picking at her skin. This area has increased warmth and erythema. 5/30; patient presents for follow-up. She completed Keflex with resolution of her symptoms to the right lower extremity. She no longer has increased warmth or erythema. She continues her infusions for her squamous cell carcinoma lesions. We continue to wrap her in 3 layer compression with silver alginate. She currently denies signs of  infection. 6/8; the patient went to see Dr. Ronnald Ramp of dermatology 2 days ago unfortunately we do not have his notes. They removed her compression wraps of course but did not adequately replace them. She comes in today with 3 wounds on the right lateral upper calf 1 medially below the obvious squamous cell carcinoma there is a large necrotic wound on the left lateral tibial area on the left and several blisters. The patient remains on Cemiplimad infusions for the squamous cell carcinoma bilaterally. She is not felt to be a good candidate for radiation. Some of the skin changes superiorly in the upper legs bilaterally according to Dr. Ronnald Ramp related to these infusions the same like fleshy nodules to me not blisters 6/13; patient presents for follow-up. We have been using silver alginate under 3 layer compression bilaterally. She has no issues or complaints today. 6/19; patient presents for follow-up. She will see her oncologist in 3 days and next week her dermatologist. She has no new complaints today.  We are using silver alginate under 3 layer compression bilaterally. 6/29; patient presents for follow-up. She she saw Dr. Darrin Nipper last week and States she received injections to the lesions on her legs. We will ask for office visit records. She could not be wrapped with compression and has more drainage on exam today. 7/6; patient presents for follow-up. She continues to receive injections to the lesions in her legs by her dermatologist. She had this done earlier today. She has no issues or complaints today. 7/13; patient presents for follow-up. She is following with Dr.Kavuri once weekly for treatment of her SCCs/KAs with IL5-FU. We have been wrapping her with silver alginate under Kerlix/Coban. She has no issues or complaints today. 7/20; patient presents for follow-up. She has elected to stop doing weekly treatments for her squamous cell carcinoma lesions. She states she experiences too much pain from  the injections. She is continuing her immunotherapy infusions. Next 1 is scheduled in August. T the legs we have been using silver alginate o under Kerlix/Coban. Her wounds actually look better today. 7/27; patient presents for follow-up. We have been using Medihoney and silver alginate under compression therapy. She has no issues or complaints today. Electronic Signature(s) Signed: 08/15/2021 4:01:46 PM By: Kalman Shan DO Entered By: Kalman Shan on 08/15/2021 14:51:31 -------------------------------------------------------------------------------- Physical Exam Details Patient Name: Date of Service: [name], [place]. 08/15/2021 1:30 PM Medical Record Number: [id number] Patient Account Number: [id number] Date of Birth/Sex: Treating RN: Jul 17, 1925 (88 y.o. [name] Primary Care Provider: [name] Other Clinician: Referring Provider: Treating Provider/Extender: [name] in Treatment: 16 Constitutional respirations regular, non-labored and within target range for patient.. Cardiovascular 2+ dorsalis pedis/posterior tibialis pulses. Psychiatric pleasant and cooperative. Notes Few small scattered open wounds to her lower extremities bilaterally limited to skin breakdown. 3 areas suspicious for malignancy. Electronic Signature(s) Signed: 08/15/2021 4:01:46 PM By: Kalman Shan DO Entered By: Kalman Shan on 08/15/2021 14:52:05 -------------------------------------------------------------------------------- Physician Orders Details Patient Name: Date of Service: [name], [place]. 08/15/2021 1:30 PM Medical Record Number: [id number] Patient Account Number: [id number] Date of Birth/Sex: Treating RN: Jan 19, 1926 (88 y.o. [name], [name] Primary Care Provider: [name] Other Clinician: Referring Provider: Treating Provider/Extender: [name] in Treatment: 93 Verbal / Phone  Orders: No Diagnosis Coding ICD-10 Coding Code Description C44.92 Squamous cell carcinoma of skin, unspecified L97.812 Non-pressure chronic ulcer of other part of right lower leg with fat layer exposed L97.822 Non-pressure chronic ulcer of other part of left lower leg with fat layer exposed I89.0 Lymphedema, not elsewhere classified I87.313 Chronic venous hypertension (idiopathic) with ulcer of bilateral lower extremity Follow-up Appointments ppointment in 1 week. - Dr. Heber [place] and [place], Room 6 overflow 08/22/2021 Thursday 130pm Return A ppointment in 2 weeks. - Dr. Heber [place] and [place], Room 6 overflow 08/29/2021 Thursday 130pm Return A Other: - Bring in your lotion from home to apply under the compression wraps. CETAPHIL LOTION Bathing/ Shower/ Hygiene May shower with protection but do not get wound dressing(s) wet. - Use cast protector Edema Control - Lymphedema / SCD / Other Lymphedema Pumps. Use Lymphedema pumps on leg(s) 2-3 times a day for 45-60 minutes. If wearing any wraps or hose, do not remove them. Continue exercising as instructed. - use one hour each time twice a day. Elevate legs to the level of the heart or above for 30 minutes daily and/or when sitting, a frequency of: - elevate the legs throughout the day heart level  if possible. Avoid standing for long periods of time. Exercise regularly Additional Orders / Instructions Follow Nutritious Diet Wound Treatment Wound #20 - Lower Leg Wound Laterality: Right, Circumferential Cleanser: Soap and Water 1 x Per Week/30 Days Discharge Instructions: May shower and wash wound with dial antibacterial soap and water prior to dressing change. Cleanser: Wound Cleanser 1 x Per Week/30 Days Discharge Instructions: Cleanse the wound with wound cleanser prior to applying a clean dressing using gauze sponges, not tissue or cotton balls. Prim Dressing: Maxorb Extra Calcium Alginate Dressing, 4x4 in 1 x Per Week/30 Days ary Discharge  Instructions: Apply calcium alginate to wound bed as instructed Prim Dressing: MediHoney Gel, tube 1.5 (oz) 1 x Per Week/30 Days ary Discharge Instructions: Apply to wound bed as instructed Secondary Dressing: ABD Pad, 8x10 1 x Per Week/30 Days Discharge Instructions: Apply over primary dressing as directed. Secondary Dressing: CarboFLEX Odor Control Dressing, 4x4 in 1 x Per Week/30 Days Discharge Instructions: Apply over primary dressing as directed. Secondary Dressing: Woven Gauze Sponge, Non-Sterile 4x4 in 1 x Per Week/30 Days Discharge Instructions: Apply over primary dressing as directed. Compression Wrap: ThreePress (3 layer compression wrap) 1 x Per Week/30 Days Discharge Instructions: Apply three layer compression as directed. Wound #7RR - Lower Leg Wound Laterality: Left, Circumferential Cleanser: Soap and Water 1 x Per Week/30 Days Discharge Instructions: May shower and wash wound with dial antibacterial soap and water prior to dressing change. Cleanser: Wound Cleanser 1 x Per Week/30 Days Discharge Instructions: Cleanse the wound with wound cleanser prior to applying a clean dressing using gauze sponges, not tissue or cotton balls. Prim Dressing: Maxorb Extra Calcium Alginate Dressing, 4x4 in 1 x Per Week/30 Days ary Discharge Instructions: Apply calcium alginate to wound bed as instructed Prim Dressing: MediHoney Gel, tube 1.5 (oz) 1 x Per Week/30 Days ary Discharge Instructions: Apply to wound bed as instructed Secondary Dressing: ABD Pad, 8x10 1 x Per Week/30 Days Discharge Instructions: Apply over primary dressing as directed. Secondary Dressing: CarboFLEX Odor Control Dressing, 4x4 in 1 x Per Week/30 Days Discharge Instructions: Apply over primary dressing as directed. Secondary Dressing: Woven Gauze Sponge, Non-Sterile 4x4 in 1 x Per Week/30 Days Discharge Instructions: Apply over primary dressing as directed. Compression Wrap: ThreePress (3 layer compression wrap) 1 x  Per Week/30 Days Discharge Instructions: Apply three layer compression as directed. Electronic Signature(s) Signed: 08/15/2021 4:01:46 PM By: Kalman Shan DO Entered By: Kalman Shan on 08/15/2021 14:52:13 -------------------------------------------------------------------------------- Problem List Details Patient Name: Date of Service: [name], [place]. 08/15/2021 1:30 PM Medical Record Number: [id number] Patient Account Number: [id number] Date of Birth/Sex: Treating RN: 1925-10-05 (88 y.o. [name], [name] Primary Care Provider: [name] Other Clinician: Referring Provider: Treating Provider/Extender: [name] in Treatment: 16 Active Problems ICD-10 Encounter Code Description Active Date MDM Diagnosis C44.92 Squamous cell carcinoma of skin, unspecified 06/11/2020 No Yes L97.812 Non-pressure chronic ulcer of other part of right lower leg with fat layer 06/11/2020 No Yes exposed L97.822 Non-pressure chronic ulcer of other part of left lower leg with fat layer exposed5/23/2022 No Yes I89.0 Lymphedema, not elsewhere classified 01/31/2021 No Yes I87.313 Chronic venous hypertension (idiopathic) with ulcer of bilateral lower extremity 03/21/2021 No Yes Inactive Problems Resolved Problems Electronic Signature(s) Signed: 08/15/2021 4:01:46 PM By: Kalman Shan DO Previous Signature: 08/15/2021 2:23:32 PM Version By: Kalman Shan DO Entered By: Kalman Shan on 08/15/2021 14:50:44 -------------------------------------------------------------------------------- Progress Note Details Patient Name: Date of Service: [name], [name] 08/15/2021 1:30  PM Medical Record Number: [id number] Patient Account Number: [id number] Date of Birth/Sex: Treating RN: Mar 09, 1925 (88 y.o. [name], [name] Primary Care Provider: [name] Other Clinician: Referring Provider: Treating Provider/Extender: [name] in Treatment: 6 Subjective Chief Complaint Information obtained from Patient Bilateral lower extremity wounds that have been biopsied and positive for squamous cell carcinoma Bilateral lower extremity wounds due to chronic venous insufficiency/lymphedema History of Present Illness (HPI) The following HPI elements were documented for the patient's wound: Location: left leg Admission 5/23 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 She states that the wound on the right has been present for a year. The 1 on the left has opened up 1 month ago. She is followed with oncology for this issue as she had biopsies that showed squamous cell carcinoma. She is also seeing radiation oncology for treatment options. She presents today because she would like for her wounds to be healed by [place]. She currently denies signs of infection. 6/1; patient presents for 1 week follow-up. She states she has tolerated the leg wraps well. She states these do not bother her and is happy to continue with them. She is scheduled to see her oncologist today to go over treatment options for the bilateral lower extremity squamous cell carcinoma. Radiation is currently not a recommended option. Patient states she overall feels well. 6/22; patient presents for 3-week follow-up. She has tolerated the wraps well until her last wrap where she states they were uncomfortable. She attributes this to the home health nurse. She denies signs of infection. She has started her first treatment of antibody infusions for her Bilateral lower extremity squamous cell carcinoma. She has no complaints today. 7/21; patient presents for 1 month follow-up. Unfortunately she has not had good experience with her wrap  changes with home health. She would like to do her own dressing changes. She continues to do her antibody infusions. She denies signs of infection. 7/28; patient presents for 1 week follow-up. At last clinic visit she was switched to daily dressing changes due to issues with the wrap and home health placing them. Unfortunately she has developed weeping to her legs bilaterally. She would like to be placed in wraps today. She would also like to follow with [place] weekly for wrap changes instead of having home health change them. She denies signs of infection. 8/4; patient presents for 1 week follow-up. She has tolerated the Kerlix/Coban wraps well. She no longer has weeping to her legs. She took the wrap off 1 day before coming in to be able to take a shower. She has no issues or complaints today. She denies signs of infection. 8/18; patient presents for follow-up. Patient has tolerated the wraps well. She brought her Velcro compression wraps today. She has no issues or complaints today. She had her chemotherapy infusion yesterday without issues. She denies signs of infection. 8/25; patient presents for follow-up. She used her juxta lite compressions for the past week. It is unclear if she is able to put these on correctly since she states she has a hard time getting them to look right. She reports 2 open wounds. She currently denies signs of infection. 9/1; patient presents for 1 week follow-up. She has 1 open wound. She tolerated the compression wraps well.  She currently denies signs of infection. 9/8; patient presents for 1 week follow-up. She has 2 open wounds 1 on each leg. She has tolerated the compression wrap well. She currently denies signs of infection. 9/15; patient presents for 1 week follow-up. She now has 3 wounds. 2 on the left and 1 on the right. She continues to tolerate the compression wrap well. She currently denies signs of infection. She has obtained furosemide by her primary care  physician and would like to discuss when to take this. 9/22; patient presents for 1 week follow-up. She has scattered wounds on her lower extremities bilaterally. She did take furosemide twice in the past week. She does not recall having to urinate more frequently. She tolerated the 3 layer compression well. She denies signs of infection. 10/13; patient has not been here recently because of [place]. Apparently the facility was only putting gauze on her legs. This is a patient I do not normally see. 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 Ronnald Ramp at [facility] dermatology. She is quite convincing that she did not have radiation to her lower legs. It is likely she also has significant chronic venous insufficiency stasis dermatitis. We have been using silver alginate under kerlix Coban. She has obvious open areas medially on the right and areas on the left. She also has areas of extensive dry flaking adherent areas on the right and to a lesser extent on the left anterior. Nodular areas on the left lateral lower leg left posterior calf and right mid calf medially. 10/21; patient presents for follow-up. She has no issues or complaints today. She has tolerated the 3 layer compression well bilaterally. She denies signs of infection. 10/27; patient presents for follow-up. She continues to tolerate the 3 layer compression wrap well. She denies signs of infection. 11/30; patient presents for follow-up. She has no issues or complaints today. 11/10; patient arrived in clinic today for nurse visit accompanied by her daughter from [place]. The daughter had multiple questions so we turned this into doctors visit. Apparently after the last time I saw this woman in October she went to see Dr. Ronnald Ramp but he did not take the wraps off. She previously was treated with Cemiplimab for her squamous cell carcinoma. She did not receive radiation to her  legs. I had wanted Dr. Ronnald Ramp to look at this because of the exceptionally damaged skin on her lower legs bilaterally. She has odd looking wounds on the left medial lower leg also extending posteriorly which we have not made a lot of progress on. But she also has a raised hyperkeratotic nodules on the right anterior tibial area plaques on the left medial lower thigh. These are not areas that are under compression. We have been using silver alginate on any open areas Liberal TCA under 3 layer compression. 11/17; patient presents for follow-up. She had her wraps taken off yesterday for her dermatology appointment. She reports excessive weeping to her legs bilaterally after the wraps were taken off. She currently denies signs of infection. 12/12/2020 upon evaluation today patient appears to be doing about the same in regard to her wounds. She was actually started on Cipro after having biopsies apparently at her dermatology clinic she does not have the results back from the actual biopsy but the culture did return and apparently they placed her on the Cipro she started that just this morning. Other than that her legs appear to  be doing okay at this time. 12/1; patient presents for follow-up. She has no issues or complaints today. She has started Lasix 40 mg daily to help with her bilateral leg swelling. 12/8; patient presents for follow-up. She has no issues or complaints today. 12/15; patient presents for 1 week follow-up. She has no issues or complaints today. 12/22; patient presents for follow-up. She has no issues or complaints today. 1/5; patient presents for follow-up. She has no issues or complaints today. She has tolerated the compression wraps well. 1/12; patient presents for follow-up. She is tolerated the compression wrap well and has no issues or complaints today. She denies signs of infection. 1/19; patient presents for follow-up. She took the compression wraps off 1 to 2 days ago to take a  shower and did not have compression wraps replaced. She reports increased blistering throughout her legs bilaterally. She currently denies signs of infection. 1/26; patient presents for follow-up. She has no issues or complaints today. She tolerated the compression wraps well. She has not heard from oncology to schedule an appointment. 2/2; patient presents for follow-up. She has no issues or complaints today. She continues to tolerate the compression wrap well. 2/16; patient presents for follow-up. She has no issues or complaints today. 2/23; patient presents for follow-up. She has no issues or complaints today. Tomorrow she has an appointment with oncology to look at the suspicious lesion on her right lower extremity. She currently denies signs of infection. 3/2; patient presents for follow-up. She has been using her juxta light compression to the right lower extremity however there has been increased swelling and opening of wounds throughout the legs with weeping. She currently denies signs of infection. She had no issues with the compression wrap to the left lower extremity. 3/16; patient presents for follow-up. She has no issues or complaints today. She states that she has been restarted on infusions for her squamous cell carcinoma of her legs. She has also been increased on Lasix to 40 mg twice daily. She has noticed a decrease in swelling to her legs. 3/23; patient presents for follow-up. She starts her second infusion next week for her squamous cell carcinoma lesions to her legs. She reports no issues with the compression wraps. 3/30; patient presents for follow-up. She has no issues or complaints today. 4/6; patient presents for follow-up. Her left lower extremity wounds have healed. She has her juxta light compression with her today. She has no issues or complaints today. 4/13; this is a patient we actually discharged to juxta lite stockings on the left leg the last time she was here. She  came in on Monday for [place] to look at the leg and a nurse visit. She had massive swelling and numerous blisters and wound reopening. We put her back in 3 layer compression although I did not generate a note. Silver alginate on the wounded areas. We have also been doing the same on the right. In the meantime she is obtained her external compression pumps that were previously ordered and she started to use them. She has skin cancers that are obvious on the right leg x2 her appointment with Dr. Jarome Matin of dermatology is on May 5.. She is also receiving weekly chemotherapy for recurrent presumably squamous cell carcinomas apparently has had 2 treatments 4/20; patient presents for follow-up. She has her new juxta lite compressions today. She continues to have open wounds to the left lower extremity. She has follow-up with dermatology in 2 weeks. She continues to have  IV infusions for her squamous cell carcinoma lesions on her legs. 4/28; the patient has bilateral lower extremity wounds predominantly on the right lateral upper leg, right anterior lower leg which in itself is probably a recurrent cancer as well as the left lateral lower leg. She has recurrent squamous cell carcinoma currently being treated with an IV infusion. She also has scattered nodules on her upper lower legs and thighs I am not certain of all of this is felt to be malignant as well We have been using silver alginate on the open areas wrapping her legs. She also has her external compression pumps at home but I am not clear that she is going to use these 5/2; patient presents for follow-up. She has not been taking her diuretics as prescribed. She sees Dr. Dian Situ, dermatology at the end of the week. She tolerated the compression wraps well today. She has her juxta lite compressions. 5/8; patient presents for follow-up. She saw Dr. Dian Situ, dermatology and she has nothing to report on. She has been using her juxta lite compression to the  right lower extremity. She has tolerated the compression wrap well in the left lower extremity. She has not been taking her diuretic. She is scheduled to continue her infusions for her squamous cell carcinoma on her legs. 5/18; patient presents for follow-up. We wrapped her in 3 layer compression at last clinic visit. She has no issues or complaints today. 5/22; patient presents for follow-up. She has been wrapped in 3 layer compression bilaterally to her lower extremities over the past week. She has been scratching at the top of the wrap and picking at her skin. This area has increased warmth and erythema. 5/30; patient presents for follow-up. She completed Keflex with resolution of her symptoms to the right lower extremity. She no longer has increased warmth or erythema. She continues her infusions for her squamous cell carcinoma lesions. We continue to wrap her in 3 layer compression with silver alginate. She currently denies signs of infection. 6/8; the patient went to see Dr. Ronnald Ramp of dermatology 2 days ago unfortunately we do not have his notes. They removed her compression wraps of course but did not adequately replace them. She comes in today with 3 wounds on the right lateral upper calf 1 medially below the obvious squamous cell carcinoma there is a large necrotic wound on the left lateral tibial area on the left and several blisters. The patient remains on Cemiplimad infusions for the squamous cell carcinoma bilaterally. She is not felt to be a good candidate for radiation. Some of the skin changes superiorly in the upper legs bilaterally according to Dr. Ronnald Ramp related to these infusions the same like fleshy nodules to me not blisters 6/13; patient presents for follow-up. We have been using silver alginate under 3 layer compression bilaterally. She has no issues or complaints today. 6/19; patient presents for follow-up. She will see her oncologist in 3 days and next week her dermatologist.  She has no new complaints today. We are using silver alginate under 3 layer compression bilaterally. 6/29; patient presents for follow-up. She she saw Dr. Darrin Nipper last week and States she received injections to the lesions on her legs. We will ask for office visit records. She could not be wrapped with compression and has more drainage on exam today. 7/6; patient presents for follow-up. She continues to receive injections to the lesions in her legs by her dermatologist. She had this done earlier today. She has no issues or complaints  today. 7/13; patient presents for follow-up. She is following with Dr.Kavuri once weekly for treatment of her SCCs/KAs with [id number]. We have been wrapping her with silver alginate under Kerlix/Coban. She has no issues or complaints today. 7/20; patient presents for follow-up. She has elected to stop doing weekly treatments for her squamous cell carcinoma lesions. She states she experiences too much pain from the injections. She is continuing her immunotherapy infusions. Next 1 is scheduled in August. T the legs we have been using silver alginate o under Kerlix/Coban. Her wounds actually look better today. 7/27; patient presents for follow-up. We have been using Medihoney and silver alginate under compression therapy. She has no issues or complaints today. Patient History Information obtained from Patient. Family History Unknown History. Social History Never smoker, Marital Status - Single, Alcohol Use - Never, Drug Use - No History, Caffeine Use - Never. Medical History Eyes Denies history of Cataracts, Optic Neuritis Ear/Nose/Mouth/Throat Denies history of Chronic sinus problems/congestion, Middle ear problems Hematologic/Lymphatic Patient has history of Anemia Denies history of Hemophilia, Human Immunodeficiency Virus, Lymphedema, Sickle Cell Disease Respiratory Denies history of Aspiration, Asthma, Chronic Obstructive Pulmonary Disease (COPD),  Pneumothorax, Sleep Apnea, Tuberculosis Cardiovascular Patient has history of Arrhythmia - Atrial Flutter, A fibb, Congestive Heart Failure, Hypertension Denies history of Angina, Coronary Artery Disease, Deep Vein Thrombosis, Hypotension, Myocardial Infarction, Peripheral Arterial Disease, Peripheral Venous Disease, Phlebitis, Vasculitis Gastrointestinal Patient has history of Colitis Denies history of Cirrhosis , Crohnoos, Hepatitis A, Hepatitis B, Hepatitis C Endocrine Denies history of Type I Diabetes, Type II Diabetes Genitourinary Denies history of End Stage Renal Disease Immunological Denies history of Lupus Erythematosus, Raynaudoos, Scleroderma Integumentary (Skin) Denies history of History of Burn Musculoskeletal Patient has history of Osteoarthritis Denies history of Gout, Rheumatoid Arthritis, Osteomyelitis Neurologic Denies history of Dementia, Neuropathy, Quadriplegia, Paraplegia, Seizure Disorder Oncologic Denies history of Received Chemotherapy, Received Radiation Hospitalization/Surgery History - removal of rod left leg. 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 Vitals Time Taken: 2:00 PM, Height: 65 in, Weight: 163 lbs, BMI: 27.1, Temperature: 97.6 F, Pulse: 73 bpm, Respiratory Rate: 20 breaths/min, Blood Pressure: 130/76 mmHg. Cardiovascular 2+ dorsalis pedis/posterior tibialis pulses. Psychiatric pleasant and cooperative. General Notes: Few small scattered open wounds to her lower extremities bilaterally limited to skin breakdown. 3 areas suspicious for malignancy. Integumentary (Hair, Skin) Wound #20 status is Open. Original cause of wound was Gradually Appeared. The date acquired was: 06/25/2021. The wound has been in treatment 7  weeks. The wound is located on the Right,Circumferential Lower Leg. The wound measures 32cm length x 22cm width x 0.1cm depth; 552.92cm^2 area and 55.292cm^3 volume. There is Fat Layer (Subcutaneous Tissue) exposed. There is no tunneling or undermining noted. There is a medium amount of serosanguineous drainage noted. The wound margin is distinct with the outline attached to the wound base. There is large (67-100%) pink granulation within the wound bed. Wound #7RR status is Open. Original cause of wound was Blister. The date acquired was: 04/20/2020. The wound has been in treatment 61 weeks. The wound is located on the Left,Circumferential Lower Leg. The wound measures 3cm length x 9cm width x 0.1cm depth; 21.206cm^2 area and 2.121cm^3 volume. There is Fat Layer (Subcutaneous Tissue) exposed. There is no tunneling or undermining noted. There is a medium amount of serosanguineous drainage noted. The wound margin is distinct with the outline attached to the wound  base. There is large (67-100%) red, pink granulation within the wound bed. There is no necrotic tissue within the wound bed. Assessment Active Problems ICD-10 Squamous cell carcinoma of skin, unspecified Non-pressure chronic ulcer of other part of right lower leg with fat layer exposed Non-pressure chronic ulcer of other part of left lower leg with fat layer exposed Lymphedema, not elsewhere classified Chronic venous hypertension (idiopathic) with ulcer of bilateral lower extremity Patient's wounds are stable. I recommended continuing Medihoney and silver alginate under compression therapy. This is a palliative wound care case. Procedures Wound #20 Pre-procedure diagnosis of Wound #20 is a Lymphedema located on the Right,Circumferential Lower Leg . There was a Three Layer Compression Therapy Procedure by Deon Pilling, RN. Post procedure Diagnosis Wound #20: Same as Pre-Procedure Wound #7RR Pre-procedure diagnosis of Wound #7RR is a  Malignant Wound located on the Left,Circumferential Lower Leg . There was a Three Layer Compression Therapy Procedure by Deon Pilling, RN. Post procedure Diagnosis Wound #7RR: Same as Pre-Procedure Plan Follow-up Appointments: Return Appointment in 1 week. - Dr. Heber [place] [facility] [place], Room 6 overflow 08/22/2021 Thursday 130pm Return Appointment in 2 weeks. - Dr. Heber [place] [facility] [place] [facility], Room 6 overflow 08/29/2021 Thursday 130pm Other: - Bring in your lotion from home to apply under the compression wraps. CETAPHIL LOTION Bathing/ Shower/ Hygiene: May shower with protection but do not get wound dressing(s) wet. - Use cast protector Edema Control - Lymphedema / SCD / Other: Lymphedema Pumps. Use Lymphedema pumps on leg(s) 2-3 times a day for 45-60 minutes. If wearing any wraps or hose, do not remove them. Continue exercising as instructed. - use one hour each time twice a day. Elevate legs to the level of the heart or above for 30 minutes daily and/or when sitting, a frequency of: - elevate the legs throughout the day heart level if possible. Avoid standing for long periods of time. Exercise regularly Additional Orders / Instructions: Follow Nutritious Diet WOUND #20: - Lower Leg Wound Laterality: Right, Circumferential Cleanser: Soap and Water 1 x Per Week/30 Days Discharge Instructions: May shower and wash wound with dial antibacterial soap and water prior to dressing change. Cleanser: Wound Cleanser 1 x Per Week/30 Days Discharge Instructions: Cleanse the wound with wound cleanser prior to applying a clean dressing using gauze sponges, not tissue or cotton balls. Prim Dressing: Maxorb Extra Calcium Alginate Dressing, 4x4 in 1 x Per Week/30 Days ary Discharge Instructions: Apply calcium alginate to wound bed as instructed Prim Dressing: MediHoney Gel, tube 1.5 (oz) 1 x Per Week/30 Days ary Discharge Instructions: Apply to wound bed as instructed Secondary Dressing: ABD Pad, 8x10 1 x Per Week/30  Days Discharge Instructions: Apply over primary dressing as directed. Secondary Dressing: CarboFLEX Odor Control Dressing, 4x4 in 1 x Per Week/30 Days Discharge Instructions: Apply over primary dressing as directed. Secondary Dressing: Woven Gauze Sponge, Non-Sterile 4x4 in 1 x Per Week/30 Days Discharge Instructions: Apply over primary dressing as directed. Com pression Wrap: ThreePress (3 layer compression wrap) 1 x Per Week/30 Days Discharge Instructions: Apply three layer compression as directed. WOUND #7RR: - Lower Leg Wound Laterality: Left, Circumferential Cleanser: Soap and Water 1 x Per Week/30 Days Discharge Instructions: May shower and wash wound with dial antibacterial soap and water prior to dressing change. Cleanser: Wound Cleanser 1 x Per Week/30 Days Discharge Instructions: Cleanse the wound with wound cleanser prior to applying a clean dressing using gauze sponges, not tissue or cotton balls. Prim Dressing: Maxorb Extra Calcium Alginate Dressing, 4x4 in 1  x Per Week/30 Days ary Discharge Instructions: Apply calcium alginate to wound bed as instructed Prim Dressing: MediHoney Gel, tube 1.5 (oz) 1 x Per Week/30 Days ary Discharge Instructions: Apply to wound bed as instructed Secondary Dressing: ABD Pad, 8x10 1 x Per Week/30 Days Discharge Instructions: Apply over primary dressing as directed. Secondary Dressing: CarboFLEX Odor Control Dressing, 4x4 in 1 x Per Week/30 Days Discharge Instructions: Apply over primary dressing as directed. Secondary Dressing: Woven Gauze Sponge, Non-Sterile 4x4 in 1 x Per Week/30 Days Discharge Instructions: Apply over primary dressing as directed. Com pression Wrap: ThreePress (3 layer compression wrap) 1 x Per Week/30 Days Discharge Instructions: Apply three layer compression as directed. 1. Medihoney with silver alginate under 3 layer compression bilaterally to lower extremities 2. Follow-up in 1 week Electronic Signature(s) Signed:  08/15/2021 4:01:46 PM By: Kalman Shan DO Entered By: Kalman Shan on 08/15/2021 14:54:16 -------------------------------------------------------------------------------- HxROS Details Patient Name: Date of Service: [name], [place]. 08/15/2021 1:30 PM Medical Record Number: [id number] Patient Account Number: [id number] Date of Birth/Sex: Treating RN: [birth year] (88 y.o. [name] Primary Care Provider: [name] Other Clinician: Referring Provider: Treating Provider/Extender: [name] in Treatment: 30 Information Obtained From Patient Eyes Medical History: Negative for: Cataracts; Optic Neuritis Ear/Nose/Mouth/Throat Medical History: Negative for: Chronic sinus problems/congestion; Middle ear problems Hematologic/Lymphatic Medical History: Positive for: Anemia Negative for: Hemophilia; Human Immunodeficiency Virus; Lymphedema; Sickle Cell Disease Respiratory Medical History: Negative for: Aspiration; Asthma; Chronic Obstructive Pulmonary Disease (COPD); Pneumothorax; Sleep Apnea; Tuberculosis Cardiovascular Medical History: Positive for: Arrhythmia - Atrial Flutter, A fibb; Congestive Heart Failure; Hypertension Negative for: Angina; Coronary Artery Disease; Deep Vein Thrombosis; Hypotension; Myocardial Infarction; Peripheral Arterial Disease; Peripheral Venous Disease; Phlebitis; Vasculitis Past Medical History Notes: hyperlipidemia Gastrointestinal Medical History: Positive for: Colitis Negative for: Cirrhosis ; Crohns; Hepatitis A; Hepatitis B; Hepatitis C Endocrine Medical History: Negative for: Type I Diabetes; Type II Diabetes Past Medical History Notes: hypothyroidism Genitourinary Medical History: Negative for: End Stage Renal Disease Immunological Medical History: Negative for: Lupus Erythematosus; [place]; Scleroderma Integumentary (Skin) Medical History: Negative for: History of  Burn Musculoskeletal Medical History: Positive for: Osteoarthritis Negative for: Gout; Rheumatoid Arthritis; Osteomyelitis Neurologic Medical History: Negative for: Dementia; Neuropathy; Quadriplegia; Paraplegia; Seizure Disorder Past Medical History Notes: lumbar spindylolysis Oncologic Medical History: Negative for: Received Chemotherapy; Received Radiation Past Medical History Notes: BLE squamous ceel carcionoma Immunizations Pneumococcal Vaccine: Received Pneumococcal Vaccination: Yes Received Pneumococcal Vaccination On or After 60th Birthday: No Implantable Devices None Hospitalization / Surgery History Type of Hospitalization/Surgery removal of rod left leg Family and Social History Unknown History: Yes; Never smoker; Marital Status - Single; Alcohol Use: Never; Drug Use: No History; Caffeine Use: Never; Financial Concerns: No; Food, Clothing or Shelter Needs: No; Support System Lacking: No; Transportation Concerns: No Electronic Signature(s) Signed: 08/15/2021 4:01:46 PM By: Kalman Shan DO Signed: 08/15/2021 4:23:35 PM By: Deon Pilling RN, BSN Entered By: Kalman Shan on 08/15/2021 14:51:37 -------------------------------------------------------------------------------- [place] Details Patient Name: Date of Service: [name], Anahla A. 08/15/2021 Medical Record Number: [id number] Patient Account Number: [id number] Date of Birth/Sex: Treating RN: [birth year] (88 y.o. [name], [name] Primary Care Provider: [name] Other Clinician: Referring Provider: Treating Provider/Extender: [name] in Treatment: 23 Diagnosis Coding ICD-10 Codes Code Description C44.92 Squamous cell carcinoma of skin, unspecified L97.812 Non-pressure chronic ulcer of other part of right lower leg with fat layer exposed L97.822 Non-pressure chronic ulcer of other part of left lower leg with fat  layer exposed I89.0 Lymphedema, not  elsewhere classified I87.313 Chronic venous hypertension (idiopathic) with ulcer of bilateral lower extremity Facility Procedures CPT4: Code [id number] Description: 81 BILATERAL: Application of multi-layer venous compression system; leg (below knee), including ankle and t. Modifier: Quantity: 1 Physician Procedures : CPT4 Code Description Modifier [id number] - WC PHYS LEVEL 3 - EST PT ICD-10 Diagnosis Description C44.92 Squamous cell carcinoma of skin, unspecified L97.812 Non-pressure chronic ulcer of other part of right lower leg with fat layer exposed L97.822  Non-pressure chronic ulcer of other part of left lower leg with fat layer exposed I87.313 Chronic venous hypertension (idiopathic) with ulcer of bilateral lower extremity Quantity: 1 Electronic Signature(s) Signed: 08/15/2021 4:01:46 PM By: Kalman Shan DO Entered By: Kalman Shan on 08/15/2021 14:54:39

## 2021-08-16 DIAGNOSIS — I5030 Unspecified diastolic (congestive) heart failure: Secondary | ICD-10-CM | POA: Diagnosis not present

## 2021-08-19 DIAGNOSIS — I5030 Unspecified diastolic (congestive) heart failure: Secondary | ICD-10-CM | POA: Diagnosis not present

## 2021-08-20 ENCOUNTER — Encounter (HOSPITAL_BASED_OUTPATIENT_CLINIC_OR_DEPARTMENT_OTHER): Payer: Medicare Other | Attending: Internal Medicine | Admitting: Internal Medicine

## 2021-08-20 ENCOUNTER — Other Ambulatory Visit: Payer: Self-pay

## 2021-08-20 DIAGNOSIS — C44729 Squamous cell carcinoma of skin of left lower limb, including hip: Secondary | ICD-10-CM | POA: Insufficient documentation

## 2021-08-20 DIAGNOSIS — L97822 Non-pressure chronic ulcer of other part of left lower leg with fat layer exposed: Secondary | ICD-10-CM | POA: Insufficient documentation

## 2021-08-20 DIAGNOSIS — C4492 Squamous cell carcinoma of skin, unspecified: Secondary | ICD-10-CM | POA: Diagnosis not present

## 2021-08-20 DIAGNOSIS — Z853 Personal history of malignant neoplasm of breast: Secondary | ICD-10-CM | POA: Insufficient documentation

## 2021-08-20 DIAGNOSIS — I10 Essential (primary) hypertension: Secondary | ICD-10-CM | POA: Diagnosis not present

## 2021-08-20 DIAGNOSIS — I890 Lymphedema, not elsewhere classified: Secondary | ICD-10-CM | POA: Insufficient documentation

## 2021-08-20 DIAGNOSIS — L97812 Non-pressure chronic ulcer of other part of right lower leg with fat layer exposed: Secondary | ICD-10-CM | POA: Insufficient documentation

## 2021-08-20 DIAGNOSIS — E039 Hypothyroidism, unspecified: Secondary | ICD-10-CM | POA: Diagnosis not present

## 2021-08-20 DIAGNOSIS — R278 Other lack of coordination: Secondary | ICD-10-CM | POA: Diagnosis not present

## 2021-08-20 DIAGNOSIS — I4819 Other persistent atrial fibrillation: Secondary | ICD-10-CM | POA: Diagnosis not present

## 2021-08-20 DIAGNOSIS — I87313 Chronic venous hypertension (idiopathic) with ulcer of bilateral lower extremity: Secondary | ICD-10-CM | POA: Insufficient documentation

## 2021-08-20 DIAGNOSIS — M6281 Muscle weakness (generalized): Secondary | ICD-10-CM | POA: Diagnosis not present

## 2021-08-20 DIAGNOSIS — C44722 Squamous cell carcinoma of skin of right lower limb, including hip: Secondary | ICD-10-CM | POA: Diagnosis not present

## 2021-08-20 DIAGNOSIS — D649 Anemia, unspecified: Secondary | ICD-10-CM | POA: Diagnosis not present

## 2021-08-20 DIAGNOSIS — R2689 Other abnormalities of gait and mobility: Secondary | ICD-10-CM | POA: Diagnosis not present

## 2021-08-20 DIAGNOSIS — I5030 Unspecified diastolic (congestive) heart failure: Secondary | ICD-10-CM | POA: Diagnosis not present

## 2021-08-20 NOTE — Progress Notes (Signed)
[name] ([id number]) , Visit Report for 08/20/2021 Arrival Information Details Patient Name: Date of Service: [name], [place]. 08/20/2021 1:30 PM Medical Record Number: [id number] Patient Account Number: [id number] Date of Birth/Sex: Treating RN: 1925/08/07 (88 y.o. [name], [name] Primary Care Brienne Liguori: [name] Other Clinician: Referring Levester Waldridge: Treating Vinette Crites/Extender: [name] in Treatment: 71 Visit Information History Since Last Visit Added or deleted any medications: No Patient Arrived: Wheel Chair Any new allergies or adverse reactions: No Arrival Time: 13:50 Had a fall or experienced change in No Accompanied By: self activities of daily living that may affect Transfer Assistance: None risk of falls: Patient Identification Verified: Yes Signs or symptoms of abuse/neglect since last visito No Secondary Verification Process Completed: Yes Hospitalized since last visit: No Patient Requires Transmission-Based No Implantable device outside of the clinic excluding No Precautions: cellular tissue based products placed in the center Patient Has Alerts: Yes since last visit: Patient Alerts: R ABI = Non compressible Has Dressing in Place as Prescribed: Yes L ABI = Non compressible Has Compression in Place as Prescribed: Yes Pain Present Now: No Electronic Signature(s) Signed: 08/20/2021 4:41:40 PM By: Deon Pilling RN, BSN Entered By: Deon Pilling on 08/20/2021 14:08:55 -------------------------------------------------------------------------------- Compression Therapy Details Patient Name: Date of Service: [name], Bristyn A. 08/20/2021 1:30 PM Medical Record Number: [id number] Patient Account Number: [id number] Date of Birth/Sex: Treating RN: Nov 15, 1925 (88 y.o. [name] Primary Care Maryhelen Lindler: [name] Other Clinician: Referring Kimyata Milich: Treating Beuna Bolding/Extender: [name] in Treatment: 17 Compression Therapy Performed for Wound Assessment: Wound #20 Right,Circumferential Lower Leg Performed By: Clinician Deon Pilling, RN Compression Type: Three Layer Post Procedure Diagnosis Same as Pre-procedure Electronic Signature(s) Signed: 08/20/2021 4:41:40 PM By: Deon Pilling RN, BSN Entered By: Deon Pilling on 08/20/2021 14:11:04 -------------------------------------------------------------------------------- Compression Therapy Details Patient Name: Date of Service: [name], [place]. 08/20/2021 1:30 PM Medical Record Number: [id number] Patient Account Number: [id number] Date of Birth/Sex: Treating RN: 1925/07/16 (88 y.o. [name] Primary Care Syed Zukas: [name] Other Clinician: Referring Trelyn Vanderlinde: Treating Annelyse Rey/Extender: [name] in Treatment: 67 Compression Therapy Performed for Wound Assessment: Wound #7RR Left,Circumferential Lower Leg Performed By: Clinician Deon Pilling, RN Compression Type: Three Layer Post Procedure Diagnosis Same as Pre-procedure Electronic Signature(s) Signed: 08/20/2021 4:41:40 PM By: Deon Pilling RN, BSN Entered By: Deon Pilling on 08/20/2021 14:11:04 -------------------------------------------------------------------------------- Encounter Discharge Information Details Patient Name: Date of Service: [name], [place]. 08/20/2021 1:30 PM Medical Record Number: [id number] Patient Account Number: [id number] Date of Birth/Sex: Treating RN: October 24, 1925 (88 y.o. [name] Primary Care Maryana Pittmon: [name] Other Clinician: Referring Jaaron Oleson: Treating Tamir Wallman/Extender: [name] in Treatment: 46 Encounter Discharge Information Items Discharge Condition: Stable Ambulatory Status: Wheelchair Discharge Destination: Home Transportation: Private Auto Accompanied By: self Schedule Follow-up Appointment:  Yes Clinical Summary of Care: Electronic Signature(s) Signed: 08/20/2021 4:41:40 PM By: Deon Pilling RN, BSN Entered By: Deon Pilling on 08/20/2021 14:12:48 -------------------------------------------------------------------------------- Lower Extremity Assessment Details Patient Name: Date of Service: [name], [place]. 08/20/2021 1:30 PM Medical Record Number: [id number] Patient Account Number: [id number] Date of Birth/Sex: Treating RN: 1925-07-03 (88 y.o. [name] Primary Care Parish Dubose: [name] Other Clinician: Referring Denaya Horn: Treating Neeraj Housand/Extender: [name] in Treatment: 96 Edema Assessment Assessed: [Left: Yes] [Right: Yes] Edema: [Left: Yes] [Right: Yes] Calf Left: Right: Point of Measurement: 30 cm From Medial Instep 32  cm 32 cm Ankle Left: Right: Point of Measurement: 9 cm From Medial Instep 21 cm 21 cm Vascular Assessment Pulses: Dorsalis Pedis Palpable: [Left:Yes] [Right:Yes] Electronic Signature(s) Signed: 08/20/2021 4:41:40 PM By: Deon Pilling RN, BSN Entered By: Deon Pilling on 08/20/2021 14:09:41 -------------------------------------------------------------------------------- Multi Wound Chart Details Patient Name: Date of Service: [name], [place]. 08/20/2021 1:30 PM Medical Record Number: [id number] Patient Account Number: [id number] Date of Birth/Sex: Treating RN: Mar 11, 1925 (88 y.o. [name], [name] Primary Care Allea Kassner: [name] Other Clinician: Referring Findlay Dagher: Treating Adriane Guglielmo/Extender: [name] in Treatment: 74 Vital Signs Height(in): 65 Pulse(bpm): 80 Weight(lbs): 163 Blood Pressure(mmHg): 159/82 Body Mass Index(BMI): 27.1 Temperature(F): 98.2 Respiratory Rate(breaths/min): 22 Photos: [20:No Photos Right, Circumferential Lower Leg] [7RR:No Photos Left, Circumferential Lower Leg] [N/A:N/A N/A] Wound Location: [20:Gradually  Appeared] [7RR:Blister] [N/A:N/A] Wounding Event: [20:Lymphedema] [7RR:Malignant Wound] [N/A:N/A] Primary Etiology: [20:Anemia, Arrhythmia, Congestive Heart Anemia, Arrhythmia, Congestive Heart N/A] Comorbid History: [20:Failure, Hypertension, Colitis, Osteoarthritis 06/25/2021] [7RR:Failure, Hypertension, Colitis, Osteoarthritis 04/20/2020] [N/A:N/A] Date Acquired: [20:7] [7RR:62] [N/A:N/A] Weeks of Treatment: [20:Open] [7RR:Open] [N/A:N/A] Wound Status: [20:No] [7RR:Yes] [N/A:N/A] Wound Recurrence: [20:Yes] [7RR:Yes] [N/A:N/A] Clustered Wound: [20:5] [7RR:2] [N/A:N/A] Clustered Quantity: [20:28x14x0.1] [7RR:2x9x0.1] [N/A:N/A] Measurements L x W x D (cm) [44:315.400] [7RR:14.137] [N/A:N/A] A (cm) : rea [20:30.788] [7RR:1.414] [N/A:N/A] Volume (cm) : [20:22.50%] [7RR:-360.30%] [N/A:N/A] % Reduction in Area: [20:22.50%] [7RR:-360.60%] [N/A:N/A] % Reduction in Volume: [20:Full Thickness Without Exposed] [7RR:Full Thickness Without Exposed] [N/A:N/A] Classification: [20:Support Structures Medium] [7RR:Support Structures Medium] [N/A:N/A] Exudate Amount: [20:Serosanguineous] [7RR:Serosanguineous] [N/A:N/A] Exudate Type: [20:red, brown] [7RR:red, brown] [N/A:N/A] Exudate Color: [20:Distinct, outline attached] [7RR:Distinct, outline attached] [N/A:N/A] Wound Margin: [20:Large (67-100%)] [7RR:Large (67-100%)] [N/A:N/A] Granulation Amount: [20:Pink] [7RR:Red, Pink] [N/A:N/A] Granulation Quality: [20:Small (1-33%)] [7RR:None Present (0%)] [N/A:N/A] Necrotic Amount: [20:Fat Layer (Subcutaneous Tissue): Yes Fat Layer (Subcutaneous Tissue): Yes N/A] Exposed Structures: [20:Fascia: No Tendon: No Muscle: No Joint: No Bone: No Medium (34-66%)] [7RR:Fascia: No Tendon: No Muscle: No Joint: No Bone: No Large (67-100%)] [N/A:N/A] Epithelialization: [20:Compression Therapy] [7RR:Compression Therapy] [N/A:N/A] Treatment Notes Wound #20 (Lower Leg) Wound Laterality: Right, Circumferential Cleanser Soap and  Water Discharge Instruction: May shower and wash wound with dial antibacterial soap and water prior to dressing change. Wound Cleanser Discharge Instruction: Cleanse the wound with wound cleanser prior to applying a clean dressing using gauze sponges, not tissue or cotton balls. Peri-Wound Care Topical Primary Dressing Maxorb Extra Calcium Alginate Dressing, 4x4 in Discharge Instruction: Apply calcium alginate to wound bed as instructed MediHoney Gel, tube 1.5 (oz) Discharge Instruction: Apply to wound bed as instructed Secondary Dressing ABD Pad, 8x10 Discharge Instruction: Apply over primary dressing as directed. CarboFLEX Odor Control Dressing, 4x4 in Discharge Instruction: Apply over primary dressing as directed. Woven Gauze Sponge, Non-Sterile 4x4 in Discharge Instruction: Apply over primary dressing as directed. Secured With Compression Wrap ThreePress (3 layer compression wrap) Discharge Instruction: Apply three layer compression as directed. Compression Stockings Add-Ons Wound #7RR (Lower Leg) Wound Laterality: Left, Circumferential Cleanser Soap and Water Discharge Instruction: May shower and wash wound with dial antibacterial soap and water prior to dressing change. Wound Cleanser Discharge Instruction: Cleanse the wound with wound cleanser prior to applying a clean dressing using gauze sponges, not tissue or cotton balls. Peri-Wound Care Topical Primary Dressing Maxorb Extra Calcium Alginate Dressing, 4x4 in Discharge Instruction: Apply calcium alginate to wound bed as instructed MediHoney Gel, tube 1.5 (oz) Discharge Instruction: Apply to wound bed as instructed Secondary Dressing ABD Pad, 8x10 Discharge Instruction: Apply over primary dressing as directed. CarboFLEX Odor Control Dressing,  4x4 in Discharge Instruction: Apply over primary dressing as directed. Woven Gauze Sponge, Non-Sterile 4x4 in Discharge Instruction: Apply over primary dressing as  directed. Secured With Compression Wrap ThreePress (3 layer compression wrap) Discharge Instruction: Apply three layer compression as directed. Compression Stockings Add-Ons Electronic Signature(s) Signed: 08/20/2021 3:04:33 PM By: Kalman Shan DO Signed: 08/20/2021 4:41:40 PM By: Deon Pilling RN, BSN Entered By: Kalman Shan on 08/20/2021 14:53:55 -------------------------------------------------------------------------------- Multi-Disciplinary Care Plan Details Patient Name: Date of Service: [name], Evellyn A. 08/20/2021 1:30 PM Medical Record Number: [id number] Patient Account Number: [id number] Date of Birth/Sex: Treating RN: 05-05-1925 (88 y.o. [name] Primary Care Elie Leppo: [name] Other Clinician: Referring Terriyah Westra: Treating Tymir Terral/Extender: [name] in Treatment: 46 Multidisciplinary Care Plan reviewed with physician Active Inactive Venous Leg Ulcer Nursing Diagnoses: Knowledge deficit related to disease process and management Potential for venous Insuffiency (use before diagnosis confirmed) Goals: Patient will maintain optimal edema control Date Initiated: 11/01/2020 Target Resolution Date: 09/20/2021 Goal Status: Active Interventions: Assess peripheral edema status every visit. Compression as ordered Provide education on venous insufficiency Treatment Activities: Therapeutic compression applied : 11/01/2020 Notes: 12/27/20: Edema control ongoing 01/24/21: Edema control continues, using 3 layer compression Wound/Skin Impairment Nursing Diagnoses: Impaired tissue integrity Knowledge deficit related to ulceration/compromised skin integrity Goals: Patient/caregiver will verbalize understanding of skin care regimen Date Initiated: 06/11/2020 Target Resolution Date: 09/21/2021 Goal Status: Active Interventions: Assess patient/caregiver ability to obtain necessary supplies Assess patient/caregiver ability  to perform ulcer/skin care regimen upon admission and as needed Assess ulceration(s) every visit Provide education on ulcer and skin care Notes: 10/11/20: Wound care regimen ongoing. 12/27/20: Wound care continues Electronic Signature(s) Signed: 08/20/2021 4:41:40 PM By: Deon Pilling RN, BSN Entered By: Deon Pilling on 08/20/2021 14:10:38 -------------------------------------------------------------------------------- Pain Assessment Details Patient Name: Date of Service: [name], [place]. 08/20/2021 1:30 PM Medical Record Number: [id number] Patient Account Number: [id number] Date of Birth/Sex: Treating RN: 18-Feb-1925 (88 y.o. [name] Primary Care Algernon Mundie: [name] Other Clinician: Referring Juana Haralson: Treating Ewart Carrera/Extender: [name] in Treatment: 89 Active Problems Location of Pain Severity and Description of Pain Patient Has Paino No Site Locations Rate the pain. Current Pain Level: 0 Pain Management and Medication Current Pain Management: Medication: No Cold Application: No Rest: No Massage: No Activity: No T.E.N.S.: No Heat Application: No Leg drop or elevation: No Is the Current Pain Management Adequate: Adequate How does your wound impact your activities of daily livingo Sleep: No Bathing: No Appetite: No Relationship With Others: No Bladder Continence: No Emotions: No Bowel Continence: No Work: No Toileting: No Drive: No Dressing: No Hobbies: No Electronic Signature(s) Signed: 08/20/2021 4:41:40 PM By: Deon Pilling RN, BSN Entered By: Deon Pilling on 08/20/2021 14:09:20 -------------------------------------------------------------------------------- Patient/Caregiver Education Details Patient Name: Date of Service: [name], [place] A. 8/1/2023andnbsp1:30 PM Medical Record Number: [id number] Patient Account Number: [id number] Date of Birth/Gender: Treating RN: 09-Jun-1925 (88 y.o. [name] Primary Care Physician: [name] Other Clinician: Referring Physician: Treating Physician/Extender: [name] in Treatment: 35 Education Assessment Education Provided To: Patient Education Topics Provided Wound/Skin Impairment: Handouts: Skin Care Do's and Dont's Methods: Explain/Verbal Responses: Reinforcements needed Electronic Signature(s) Signed: 08/20/2021 4:41:40 PM By: Deon Pilling RN, BSN Entered By: Deon Pilling on 08/20/2021 14:10:51 -------------------------------------------------------------------------------- Wound Assessment Details Patient Name: Date of Service: [name], [place]. 08/20/2021 1:30 PM Medical Record Number: [id number] Patient Account Number: [id number] Date of Birth/Sex: Treating RN: 05-Jun-1925 (88  y.o. [name], [name] Primary Care Katricia Prehn: [name] Other Clinician: Referring Jeraldine Primeau: Treating Meko Bellanger/Extender: [name] in Treatment: 4 Wound Status Wound Number: 20 Primary Lymphedema Etiology: Wound Location: Right, Circumferential Lower Leg Wound Open Wounding Event: Gradually Appeared Status: Date Acquired: 06/25/2021 Comorbid Anemia, Arrhythmia, Congestive Heart Failure, Hypertension, Weeks Of Treatment: 7 History: Colitis, Osteoarthritis Clustered Wound: Yes Wound Measurements Length: (cm) 28 Width: (cm) 14 Depth: (cm) 0.1 Clustered Quantity: 5 Area: (cm) 307.876 Volume: (cm) 30.788 % Reduction in Area: 22.5% % Reduction in Volume: 22.5% Epithelialization: Medium (34-66%) Tunneling: No Undermining: No Wound Description Classification: Full Thickness Without Exposed Support Structures Wound Margin: Distinct, outline attached Exudate Amount: Medium Exudate Type: Serosanguineous Exudate Color: red, brown Foul Odor After Cleansing: No Slough/Fibrino Yes Wound Bed Granulation Amount: Large (67-100%) Exposed  Structure Granulation Quality: Pink Fascia Exposed: No Necrotic Amount: Small (1-33%) Fat Layer (Subcutaneous Tissue) Exposed: Yes Necrotic Quality: Adherent Slough Tendon Exposed: No Muscle Exposed: No Joint Exposed: No Bone Exposed: No Treatment Notes Wound #20 (Lower Leg) Wound Laterality: Right, Circumferential Cleanser Soap and Water Discharge Instruction: May shower and wash wound with dial antibacterial soap and water prior to dressing change. Wound Cleanser Discharge Instruction: Cleanse the wound with wound cleanser prior to applying a clean dressing using gauze sponges, not tissue or cotton balls. Peri-Wound Care Topical Primary Dressing Maxorb Extra Calcium Alginate Dressing, 4x4 in Discharge Instruction: Apply calcium alginate to wound bed as instructed MediHoney Gel, tube 1.5 (oz) Discharge Instruction: Apply to wound bed as instructed Secondary Dressing ABD Pad, 8x10 Discharge Instruction: Apply over primary dressing as directed. CarboFLEX Odor Control Dressing, 4x4 in Discharge Instruction: Apply over primary dressing as directed. Woven Gauze Sponge, Non-Sterile 4x4 in Discharge Instruction: Apply over primary dressing as directed. Secured With Compression Wrap ThreePress (3 layer compression wrap) Discharge Instruction: Apply three layer compression as directed. Compression Stockings Add-Ons Electronic Signature(s) Signed: 08/20/2021 4:41:40 PM By: Deon Pilling RN, BSN Entered By: Deon Pilling on 08/20/2021 14:10:04 -------------------------------------------------------------------------------- Wound Assessment Details Patient Name: Date of Service: [name], [place]. 08/20/2021 1:30 PM Medical Record Number: [id number] Patient Account Number: [id number] Date of Birth/Sex: Treating RN: 1925-10-26 (88 y.o. [name], [name] Primary Care Michele Kerlin: [name] Other Clinician: Referring Wally Shevchenko: Treating Jatorian Renault/Extender: [name] in Treatment: 23 Wound Status Wound Number: 7RR Primary Malignant Wound Etiology: Wound Location: Left, Circumferential Lower Leg Wound Open Wounding Event: Blister Status: Date Acquired: 04/20/2020 Comorbid Anemia, Arrhythmia, Congestive Heart Failure, Hypertension, Weeks Of Treatment: 62 History: Colitis, Osteoarthritis Clustered Wound: Yes Wound Measurements Length: (cm) 2 Width: (cm) 9 Depth: (cm) 0.1 Clustered Quantity: 2 Area: (cm) 14.137 Volume: (cm) 1.414 % Reduction in Area: -360.3% % Reduction in Volume: -360.6% Epithelialization: Large (67-100%) Tunneling: No Undermining: No Wound Description Classification: Full Thickness Without Exposed Support Structures Wound Margin: Distinct, outline attached Exudate Amount: Medium Exudate Type: Serosanguineous Exudate Color: red, brown Foul Odor After Cleansing: No Slough/Fibrino No Wound Bed Granulation Amount: Large (67-100%) Exposed Structure Granulation Quality: Red, Pink Fascia Exposed: No Necrotic Amount: None Present (0%) Fat Layer (Subcutaneous Tissue) Exposed: Yes Tendon Exposed: No Muscle Exposed: No Joint Exposed: No Bone Exposed: No Treatment Notes Wound #7RR (Lower Leg) Wound Laterality: Left, Circumferential Cleanser Soap and Water Discharge Instruction: May shower and wash wound with dial antibacterial soap and water prior to dressing change. Wound Cleanser Discharge Instruction: Cleanse the wound with wound cleanser prior to applying a clean dressing using gauze sponges, not tissue  or cotton balls. Peri-Wound Care Topical Primary Dressing Maxorb Extra Calcium Alginate Dressing, 4x4 in Discharge Instruction: Apply calcium alginate to wound bed as instructed MediHoney Gel, tube 1.5 (oz) Discharge Instruction: Apply to wound bed as instructed Secondary Dressing ABD Pad, 8x10 Discharge Instruction: Apply over primary dressing as directed. CarboFLEX Odor  Control Dressing, 4x4 in Discharge Instruction: Apply over primary dressing as directed. Woven Gauze Sponge, Non-Sterile 4x4 in Discharge Instruction: Apply over primary dressing as directed. Secured With Compression Wrap ThreePress (3 layer compression wrap) Discharge Instruction: Apply three layer compression as directed. Compression Stockings Add-Ons Electronic Signature(s) Signed: 08/20/2021 4:41:40 PM By: Deon Pilling RN, BSN Entered By: Deon Pilling on 08/20/2021 14:10:20 -------------------------------------------------------------------------------- Vitals Details Patient Name: Date of Service: [name], [place]. 08/20/2021 1:30 PM Medical Record Number: [id number] Patient Account Number: [id number] Date of Birth/Sex: Treating RN: [birth year] (88 y.o. [name], [name] Primary Care Arleny Kruger: [name] Other Clinician: Referring Sherrick Araki: Treating Willadeen Colantuono/Extender: [name] in Treatment: 66 Vital Signs Time Taken: 13:50 Temperature (F): 98.2 Height (in): 65 Pulse (bpm): 80 Weight (lbs): 163 Respiratory Rate (breaths/min): 22 Body Mass Index (BMI): 27.1 Blood Pressure (mmHg): 159/82 Reference Range: 80 - 120 mg / dl Electronic Signature(s) Signed: 08/20/2021 4:41:40 PM By: Deon Pilling RN, BSN Entered By: Deon Pilling on 08/20/2021 14:09:10

## 2021-08-20 NOTE — Progress Notes (Signed)
[name] ([id number]) , Visit Report for 08/20/2021 Chief Complaint Document Details Patient Name: Date of Service: [name], [place]. 08/20/2021 1:30 PM Medical Record Number: [id number] Patient Account Number: [id number] Date of Birth/Sex: Treating RN: [birth year] (88 y.o. [name] Primary Care Provider: [name] Other Clinician: Referring Provider: Treating Provider/Extender: [name] in Treatment: 17 Information Obtained from: Patient Chief Complaint Bilateral lower extremity wounds that have been biopsied and positive for squamous cell carcinoma Bilateral lower extremity wounds due to chronic venous insufficiency/lymphedema Electronic Signature(s) Signed: 08/20/2021 3:04:33 PM By: Kalman Shan DO Entered By: Kalman Shan on 08/20/2021 14:54:02 -------------------------------------------------------------------------------- HPI Details Patient Name: Date of Service: [name], [place]. 08/20/2021 1:30 PM Medical Record Number: [id number] Patient Account Number: [id number] Date of Birth/Sex: Treating RN: [birth year] (88 y.o. [name] Primary Care Provider: [name] Other Clinician: Referring Provider: Treating Provider/Extender: [name] in Treatment: 44 History of Present Illness Location: left leg HPI Description: Admission 5/23 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 She states that the wound on the right has been present for a year. The 1 on the left has opened up 1 month ago. She is followed with oncology for this issue as she had biopsies that showed squamous cell carcinoma. She is also seeing radiation oncology for treatment  options. She presents today because she would like for her wounds to be healed by [place]. She currently denies signs of infection. 6/1; patient presents for 1 week follow-up. She states she has tolerated the leg wraps well. She states these do not bother her and is happy to continue with them. She is scheduled to see her oncologist today to go over treatment options for the bilateral lower extremity squamous cell carcinoma. Radiation is currently not a recommended option. Patient states she overall feels well. 6/22; patient presents for 3-week follow-up. She has tolerated the wraps well until her last wrap where she states they were uncomfortable. She attributes this to the home health nurse. She denies signs of infection. She has started her first treatment of antibody infusions for her Bilateral lower extremity squamous cell carcinoma. She has no complaints today. 7/21; patient presents for 1 month follow-up. Unfortunately she has not had good experience with her wrap changes with home health. She would like to do her own dressing changes. She continues to do her antibody infusions. She denies signs of infection. 7/28; patient presents for 1 week follow-up. At last clinic visit she was switched to daily dressing changes due to issues with the wrap and home health placing them. Unfortunately she has developed weeping to her legs bilaterally. She would like to be placed in wraps today. She would also like to follow with [place] weekly for wrap changes instead of having home health change them. She denies signs of infection. 8/4; patient presents for 1 week follow-up. She has tolerated the Kerlix/Coban wraps well. She no longer has weeping to her legs. She took the wrap off 1 day before coming in to be able to take a shower. She has no issues or complaints today. She denies signs of infection. 8/18; patient presents for follow-up. Patient has tolerated the wraps well. She brought her Velcro compression wraps  today. She has no  issues or complaints today. She had her chemotherapy infusion yesterday without issues. She denies signs of infection. 8/25; patient presents for follow-up. She used her juxta lite compressions for the past week. It is unclear if she is able to put these on correctly since she states she has a hard time getting them to look right. She reports 2 open wounds. She currently denies signs of infection. 9/1; patient presents for 1 week follow-up. She has 1 open wound. She tolerated the compression wraps well. She currently denies signs of infection. 9/8; patient presents for 1 week follow-up. She has 2 open wounds 1 on each leg. She has tolerated the compression wrap well. She currently denies signs of infection. 9/15; patient presents for 1 week follow-up. She now has 3 wounds. 2 on the left and 1 on the right. She continues to tolerate the compression wrap well. She currently denies signs of infection. She has obtained furosemide by her primary care physician and would like to discuss when to take this. 9/22; patient presents for 1 week follow-up. She has scattered wounds on her lower extremities bilaterally. She did take furosemide twice in the past week. She does not recall having to urinate more frequently. She tolerated the 3 layer compression well. She denies signs of infection. 10/13; patient has not been here recently because of [place]. Apparently the facility was only putting gauze on her legs. This is a patient I do not normally see. 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 Ronnald Ramp at [facility] dermatology. She is quite convincing that she did not have radiation to her lower legs. It is likely she also has significant chronic venous insufficiency stasis dermatitis. We have been using silver alginate under kerlix Coban. She has obvious open areas medially on the right and areas on the left. She also has  areas of extensive dry flaking adherent areas on the right and to a lesser extent on the left anterior. Nodular areas on the left lateral lower leg left posterior calf and right mid calf medially. 10/21; patient presents for follow-up. She has no issues or complaints today. She has tolerated the 3 layer compression well bilaterally. She denies signs of infection. 10/27; patient presents for follow-up. She continues to tolerate the 3 layer compression wrap well. She denies signs of infection. 11/30; patient presents for follow-up. She has no issues or complaints today. 11/10; patient arrived in clinic today for nurse visit accompanied by her daughter from [place]. The daughter had multiple questions so we turned this into doctors visit. Apparently after the last time I saw this woman in October she went to see Dr. Ronnald Ramp but he did not take the wraps off. She previously was treated with Cemiplimab for her squamous cell carcinoma. She did not receive radiation to her legs. I had wanted Dr. Ronnald Ramp to look at this because of the exceptionally damaged skin on her lower legs bilaterally. She has odd looking wounds on the left medial lower leg also extending posteriorly which we have not made a lot of progress on. But she also has a raised hyperkeratotic nodules on the right anterior tibial area plaques on the left medial lower thigh. These are not areas that are under compression. We have been using silver alginate on any open areas Liberal TCA under 3 layer compression. 11/17; patient presents for follow-up. She had her wraps taken off yesterday for her dermatology appointment. She reports excessive  weeping to her legs bilaterally after the wraps were taken off. She currently denies signs of infection. 12/12/2020 upon evaluation today patient appears to be doing about the same in regard to her wounds. She was actually started on Cipro after having biopsies apparently at her dermatology clinic she does not  have the results back from the actual biopsy but the culture did return and apparently they placed her on the Cipro she started that just this morning. Other than that her legs appear to be doing okay at this time. 12/1; patient presents for follow-up. She has no issues or complaints today. She has started Lasix 40 mg daily to help with her bilateral leg swelling. 12/8; patient presents for follow-up. She has no issues or complaints today. 12/15; patient presents for 1 week follow-up. She has no issues or complaints today. 12/22; patient presents for follow-up. She has no issues or complaints today. 1/5; patient presents for follow-up. She has no issues or complaints today. She has tolerated the compression wraps well. 1/12; patient presents for follow-up. She is tolerated the compression wrap well and has no issues or complaints today. She denies signs of infection. 1/19; patient presents for follow-up. She took the compression wraps off 1 to 2 days ago to take a shower and did not have compression wraps replaced. She reports increased blistering throughout her legs bilaterally. She currently denies signs of infection. 1/26; patient presents for follow-up. She has no issues or complaints today. She tolerated the compression wraps well. She has not heard from oncology to schedule an appointment. 2/2; patient presents for follow-up. She has no issues or complaints today. She continues to tolerate the compression wrap well. 2/16; patient presents for follow-up. She has no issues or complaints today. 2/23; patient presents for follow-up. She has no issues or complaints today. Tomorrow she has an appointment with oncology to look at the suspicious lesion on her right lower extremity. She currently denies signs of infection. 3/2; patient presents for follow-up. She has been using her juxta light compression to the right lower extremity however there has been increased swelling and opening of wounds  throughout the legs with weeping. She currently denies signs of infection. She had no issues with the compression wrap to the left lower extremity. 3/16; patient presents for follow-up. She has no issues or complaints today. She states that she has been restarted on infusions for her squamous cell carcinoma of her legs. She has also been increased on Lasix to 40 mg twice daily. She has noticed a decrease in swelling to her legs. 3/23; patient presents for follow-up. She starts her second infusion next week for her squamous cell carcinoma lesions to her legs. She reports no issues with the compression wraps. 3/30; patient presents for follow-up. She has no issues or complaints today. 4/6; patient presents for follow-up. Her left lower extremity wounds have healed. She has her juxta light compression with her today. She has no issues or complaints today. 4/13; this is a patient we actually discharged to juxta lite stockings on the left leg the last time she was here. She came in on Monday for [place] to look at the leg and a nurse visit. She had massive swelling and numerous blisters and wound reopening. We put her back in 3 layer compression although I did not generate a note. Silver alginate on the wounded areas. We have also been doing the same on the right. In the meantime she is obtained her external compression pumps that were  previously ordered and she started to use them. She has skin cancers that are obvious on the right leg x2 her appointment with Dr. Jarome Matin of dermatology is on May 5.. She is also receiving weekly chemotherapy for recurrent presumably squamous cell carcinomas apparently has had 2 treatments 4/20; patient presents for follow-up. She has her new juxta lite compressions today. She continues to have open wounds to the left lower extremity. She has follow-up with dermatology in 2 weeks. She continues to have IV infusions for her squamous cell carcinoma lesions on her legs. 4/28;  the patient has bilateral lower extremity wounds predominantly on the right lateral upper leg, right anterior lower leg which in itself is probably a recurrent cancer as well as the left lateral lower leg. She has recurrent squamous cell carcinoma currently being treated with an IV infusion. She also has scattered nodules on her upper lower legs and thighs I am not certain of all of this is felt to be malignant as well We have been using silver alginate on the open areas wrapping her legs. She also has her external compression pumps at home but I am not clear that she is going to use these 5/2; patient presents for follow-up. She has not been taking her diuretics as prescribed. She sees Dr. Dian Situ, dermatology at the end of the week. She tolerated the compression wraps well today. She has her juxta lite compressions. 5/8; patient presents for follow-up. She saw Dr. Dian Situ, dermatology and she has nothing to report on. She has been using her juxta lite compression to the right lower extremity. She has tolerated the compression wrap well in the left lower extremity. She has not been taking her diuretic. She is scheduled to continue her infusions for her squamous cell carcinoma on her legs. 5/18; patient presents for follow-up. We wrapped her in 3 layer compression at last clinic visit. She has no issues or complaints today. 5/22; patient presents for follow-up. She has been wrapped in 3 layer compression bilaterally to her lower extremities over the past week. She has been scratching at the top of the wrap and picking at her skin. This area has increased warmth and erythema. 5/30; patient presents for follow-up. She completed Keflex with resolution of her symptoms to the right lower extremity. She no longer has increased warmth or erythema. She continues her infusions for her squamous cell carcinoma lesions. We continue to wrap her in 3 layer compression with silver alginate. She currently denies signs of  infection. 6/8; the patient went to see Dr. Ronnald Ramp of dermatology 2 days ago unfortunately we do not have his notes. They removed her compression wraps of course but did not adequately replace them. She comes in today with 3 wounds on the right lateral upper calf 1 medially below the obvious squamous cell carcinoma there is a large necrotic wound on the left lateral tibial area on the left and several blisters. The patient remains on Cemiplimad infusions for the squamous cell carcinoma bilaterally. She is not felt to be a good candidate for radiation. Some of the skin changes superiorly in the upper legs bilaterally according to Dr. Ronnald Ramp related to these infusions the same like fleshy nodules to me not blisters 6/13; patient presents for follow-up. We have been using silver alginate under 3 layer compression bilaterally. She has no issues or complaints today. 6/19; patient presents for follow-up. She will see her oncologist in 3 days and next week her dermatologist. She has no new complaints today.  We are using silver alginate under 3 layer compression bilaterally. 6/29; patient presents for follow-up. She she saw Dr. Darrin Nipper last week and States she received injections to the lesions on her legs. We will ask for office visit records. She could not be wrapped with compression and has more drainage on exam today. 7/6; patient presents for follow-up. She continues to receive injections to the lesions in her legs by her dermatologist. She had this done earlier today. She has no issues or complaints today. 7/13; patient presents for follow-up. She is following with Dr.Kavuri once weekly for treatment of her SCCs/KAs with IL5-FU. We have been wrapping her with silver alginate under Kerlix/Coban. She has no issues or complaints today. 7/20; patient presents for follow-up. She has elected to stop doing weekly treatments for her squamous cell carcinoma lesions. She states she experiences too much pain from  the injections. She is continuing her immunotherapy infusions. Next 1 is scheduled in August. T the legs we have been using silver alginate o under Kerlix/Coban. Her wounds actually look better today. 7/27; patient presents for follow-up. We have been using Medihoney and silver alginate under compression therapy. She has no issues or complaints today. 8/1; patient presents for follow-up. We have been using Medihoney and silver alginate under compression therapy. She reports no issues. She is scheduled for her infusion for her SCC later this week. Electronic Signature(s) Signed: 08/20/2021 3:04:33 PM By: Kalman Shan DO Entered By: Kalman Shan on 08/20/2021 14:54:43 -------------------------------------------------------------------------------- Physical Exam Details Patient Name: Date of Service: [name], [place]. 08/20/2021 1:30 PM Medical Record Number: [id number] Patient Account Number: [id number] Date of Birth/Sex: Treating RN: 1925-12-23 (88 y.o. [name] Primary Care Provider: [name] Other Clinician: Referring Provider: Treating Provider/Extender: [name] in Treatment: 58 Constitutional respirations regular, non-labored and within target range for patient.. Cardiovascular 2+ dorsalis pedis/posterior tibialis pulses. Psychiatric pleasant and cooperative. Notes Few small scattered open wounds to her lower extremities bilaterally limited to skin breakdown. 3 areas suspicious for malignancy. Electronic Signature(s) Signed: 08/20/2021 3:04:33 PM By: Kalman Shan DO Entered By: Kalman Shan on 08/20/2021 14:55:14 -------------------------------------------------------------------------------- Physician Orders Details Patient Name: Date of Service: [name], [place]. 08/20/2021 1:30 PM Medical Record Number: [id number] Patient Account Number: [id number] Date of Birth/Sex: Treating RN: [birth year] (88 y.o. [name],  [name] Primary Care Provider: [name] Other Clinician: Referring Provider: Treating Provider/Extender: [name] in Treatment: 70 Verbal / Phone Orders: No Diagnosis Coding ICD-10 Coding Code Description C44.92 Squamous cell carcinoma of skin, unspecified L97.812 Non-pressure chronic ulcer of other part of right lower leg with fat layer exposed L97.822 Non-pressure chronic ulcer of other part of left lower leg with fat layer exposed I89.0 Lymphedema, not elsewhere classified I87.313 Chronic venous hypertension (idiopathic) with ulcer of bilateral lower extremity Follow-up Appointments ppointment in 2 weeks. - Dr. Heber [place] and [place], Room 8 09/05/2021 Thursday 130pm Return A Nurse Visit: - 08/29/2021 Thursday 130 pm 09/10/2021 Tuesday 130pm Other: - Bring in your lotion from home to apply under the compression wraps. CETAPHIL LOTION Bathing/ Shower/ Hygiene May shower with protection but do not get wound dressing(s) wet. - Use cast protector Edema Control - Lymphedema / SCD / Other Lymphedema Pumps. Use Lymphedema pumps on leg(s) 2-3 times a day for 45-60 minutes. If wearing any wraps or hose, do not remove them. Continue exercising as instructed. - use one hour each time twice a day. Elevate legs to the level  of the heart or above for 30 minutes daily and/or when sitting, a frequency of: - elevate the legs throughout the day heart level if possible. Avoid standing for long periods of time. Exercise regularly Additional Orders / Instructions Follow Nutritious Diet Wound Treatment Wound #20 - Lower Leg Wound Laterality: Right, Circumferential Cleanser: Soap and Water 1 x Per Week/30 Days Discharge Instructions: May shower and wash wound with dial antibacterial soap and water prior to dressing change. Cleanser: Wound Cleanser 1 x Per Week/30 Days Discharge Instructions: Cleanse the wound with wound cleanser prior to applying a clean  dressing using gauze sponges, not tissue or cotton balls. Prim Dressing: Maxorb Extra Calcium Alginate Dressing, 4x4 in 1 x Per Week/30 Days ary Discharge Instructions: Apply calcium alginate to wound bed as instructed Prim Dressing: MediHoney Gel, tube 1.5 (oz) 1 x Per Week/30 Days ary Discharge Instructions: Apply to wound bed as instructed Secondary Dressing: ABD Pad, 8x10 1 x Per Week/30 Days Discharge Instructions: Apply over primary dressing as directed. Secondary Dressing: CarboFLEX Odor Control Dressing, 4x4 in 1 x Per Week/30 Days Discharge Instructions: Apply over primary dressing as directed. Secondary Dressing: Woven Gauze Sponge, Non-Sterile 4x4 in 1 x Per Week/30 Days Discharge Instructions: Apply over primary dressing as directed. Compression Wrap: ThreePress (3 layer compression wrap) 1 x Per Week/30 Days Discharge Instructions: Apply three layer compression as directed. Wound #7RR - Lower Leg Wound Laterality: Left, Circumferential Cleanser: Soap and Water 1 x Per Week/30 Days Discharge Instructions: May shower and wash wound with dial antibacterial soap and water prior to dressing change. Cleanser: Wound Cleanser 1 x Per Week/30 Days Discharge Instructions: Cleanse the wound with wound cleanser prior to applying a clean dressing using gauze sponges, not tissue or cotton balls. Prim Dressing: Maxorb Extra Calcium Alginate Dressing, 4x4 in 1 x Per Week/30 Days ary Discharge Instructions: Apply calcium alginate to wound bed as instructed Prim Dressing: MediHoney Gel, tube 1.5 (oz) 1 x Per Week/30 Days ary Discharge Instructions: Apply to wound bed as instructed Secondary Dressing: ABD Pad, 8x10 1 x Per Week/30 Days Discharge Instructions: Apply over primary dressing as directed. Secondary Dressing: CarboFLEX Odor Control Dressing, 4x4 in 1 x Per Week/30 Days Discharge Instructions: Apply over primary dressing as directed. Secondary Dressing: Woven Gauze Sponge,  Non-Sterile 4x4 in 1 x Per Week/30 Days Discharge Instructions: Apply over primary dressing as directed. Compression Wrap: ThreePress (3 layer compression wrap) 1 x Per Week/30 Days Discharge Instructions: Apply three layer compression as directed. Electronic Signature(s) Signed: 08/20/2021 3:04:33 PM By: Kalman Shan DO Entered By: Kalman Shan on 08/20/2021 14:55:22 -------------------------------------------------------------------------------- Problem List Details Patient Name: Date of Service: [name], [place]. 08/20/2021 1:30 PM Medical Record Number: [id number] Patient Account Number: [id number] Date of Birth/Sex: Treating RN: [birth year] (88 y.o. [name], [name] Primary Care Provider: [name] Other Clinician: Referring Provider: Treating Provider/Extender: [name] in Treatment: 55 Active Problems ICD-10 Encounter Code Description Active Date MDM Diagnosis C44.92 Squamous cell carcinoma of skin, unspecified 06/11/2020 No Yes L97.812 Non-pressure chronic ulcer of other part of right lower leg with fat layer 06/11/2020 No Yes exposed L97.822 Non-pressure chronic ulcer of other part of left lower leg with fat layer exposed5/23/2022 No Yes I89.0 Lymphedema, not elsewhere classified 01/31/2021 No Yes I87.313 Chronic venous hypertension (idiopathic) with ulcer of bilateral lower extremity 03/21/2021 No Yes Inactive Problems Resolved Problems Electronic Signature(s) Signed: 08/20/2021 3:04:33 PM By: Kalman Shan DO Entered By: Kalman Shan on 08/20/2021 14:53:49 --------------------------------------------------------------------------------  Progress Note Details Patient Name: Date of Service: [name], [name]. 08/20/2021 1:30 PM Medical Record Number: [id number] Patient Account Number: [id number] Date of Birth/Sex: Treating RN: May 07, 1925 (88 y.o. [name], [name] Primary Care Provider: [name] Other  Clinician: Referring Provider: Treating Provider/Extender: [name] in Treatment: 49 Subjective Chief Complaint Information obtained from Patient Bilateral lower extremity wounds that have been biopsied and positive for squamous cell carcinoma Bilateral lower extremity wounds due to chronic venous insufficiency/lymphedema History of Present Illness (HPI) The following HPI elements were documented for the patient's wound: Location: left leg Admission 5/23 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 She states that the wound on the right has been present for a year. The 1 on the left has opened up 1 month ago. She is followed with oncology for this issue as she had biopsies that showed squamous cell carcinoma. She is also seeing radiation oncology for treatment options. She presents today because she would like for her wounds to be healed by [place]. She currently denies signs of infection. 6/1; patient presents for 1 week follow-up. She states she has tolerated the leg wraps well. She states these do not bother her and is happy to continue with them. She is scheduled to see her oncologist today to go over treatment options for the bilateral lower extremity squamous cell carcinoma. Radiation is currently not a recommended option. Patient states she overall feels well. 6/22; patient presents for 3-week follow-up. She has tolerated the wraps well until her last wrap where she states they were uncomfortable. She attributes this to the home health nurse. She denies signs of infection. She has started her first treatment of antibody infusions for her Bilateral lower extremity squamous cell carcinoma. She has no complaints today. 7/21;  patient presents for 1 month follow-up. Unfortunately she has not had good experience with her wrap changes with home health. She would like to do her own dressing changes. She continues to do her antibody infusions. She denies signs of infection. 7/28; patient presents for 1 week follow-up. At last clinic visit she was switched to daily dressing changes due to issues with the wrap and home health placing them. Unfortunately she has developed weeping to her legs bilaterally. She would like to be placed in wraps today. She would also like to follow with [place] weekly for wrap changes instead of having home health change them. She denies signs of infection. 8/4; patient presents for 1 week follow-up. She has tolerated the Kerlix/Coban wraps well. She no longer has weeping to her legs. She took the wrap off 1 day before coming in to be able to take a shower. She has no issues or complaints today. She denies signs of infection. 8/18; patient presents for follow-up. Patient has tolerated the wraps well. She brought her Velcro compression wraps today. She has no issues or complaints today. She had her chemotherapy infusion yesterday without issues. She denies signs of infection. 8/25; patient presents for follow-up. She used her juxta lite compressions for the past week. It is unclear if she is able to put these on correctly since she states she has a hard time getting them to look right. She reports 2 open wounds. She currently denies signs of infection. 9/1; patient presents for  1 week follow-up. She has 1 open wound. She tolerated the compression wraps well. She currently denies signs of infection. 9/8; patient presents for 1 week follow-up. She has 2 open wounds 1 on each leg. She has tolerated the compression wrap well. She currently denies signs of infection. 9/15; patient presents for 1 week follow-up. She now has 3 wounds. 2 on the left and 1 on the right. She continues to tolerate the compression wrap  well. She currently denies signs of infection. She has obtained furosemide by her primary care physician and would like to discuss when to take this. 9/22; patient presents for 1 week follow-up. She has scattered wounds on her lower extremities bilaterally. She did take furosemide twice in the past week. She does not recall having to urinate more frequently. She tolerated the 3 layer compression well. She denies signs of infection. 10/13; patient has not been here recently because of [place]. Apparently the facility was only putting gauze on her legs. This is a patient I do not normally see. 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 Ronnald Ramp at [facility] dermatology. She is quite convincing that she did not have radiation to her lower legs. It is likely she also has significant chronic venous insufficiency stasis dermatitis. We have been using silver alginate under kerlix Coban. She has obvious open areas medially on the right and areas on the left. She also has areas of extensive dry flaking adherent areas on the right and to a lesser extent on the left anterior. Nodular areas on the left lateral lower leg left posterior calf and right mid calf medially. 10/21; patient presents for follow-up. She has no issues or complaints today. She has tolerated the 3 layer compression well bilaterally. She denies signs of infection. 10/27; patient presents for follow-up. She continues to tolerate the 3 layer compression wrap well. She denies signs of infection. 11/30; patient presents for follow-up. She has no issues or complaints today. 11/10; patient arrived in clinic today for nurse visit accompanied by her daughter from [place]. The daughter had multiple questions so we turned this into doctors visit. Apparently after the last time I saw this woman in October she went to see Dr. Ronnald Ramp but he did not take the wraps off. She previously  was treated with Cemiplimab for her squamous cell carcinoma. She did not receive radiation to her legs. I had wanted Dr. Ronnald Ramp to look at this because of the exceptionally damaged skin on her lower legs bilaterally. She has odd looking wounds on the left medial lower leg also extending posteriorly which we have not made a lot of progress on. But she also has a raised hyperkeratotic nodules on the right anterior tibial area plaques on the left medial lower thigh. These are not areas that are under compression. We have been using silver alginate on any open areas Liberal TCA under 3 layer compression. 11/17; patient presents for follow-up. She had her wraps taken off yesterday for her dermatology appointment. She reports excessive weeping to her legs bilaterally after the wraps were taken off. She currently denies signs of infection. 12/12/2020 upon evaluation today patient appears to be doing about the same in regard to her wounds. She was actually started on Cipro after having biopsies apparently at her dermatology clinic she does not have the results back from the actual biopsy but the culture did return and apparently they placed her on the  Cipro she started that just this morning. Other than that her legs appear to be doing okay at this time. 12/1; patient presents for follow-up. She has no issues or complaints today. She has started Lasix 40 mg daily to help with her bilateral leg swelling. 12/8; patient presents for follow-up. She has no issues or complaints today. 12/15; patient presents for 1 week follow-up. She has no issues or complaints today. 12/22; patient presents for follow-up. She has no issues or complaints today. 1/5; patient presents for follow-up. She has no issues or complaints today. She has tolerated the compression wraps well. 1/12; patient presents for follow-up. She is tolerated the compression wrap well and has no issues or complaints today. She denies signs of  infection. 1/19; patient presents for follow-up. She took the compression wraps off 1 to 2 days ago to take a shower and did not have compression wraps replaced. She reports increased blistering throughout her legs bilaterally. She currently denies signs of infection. 1/26; patient presents for follow-up. She has no issues or complaints today. She tolerated the compression wraps well. She has not heard from oncology to schedule an appointment. 2/2; patient presents for follow-up. She has no issues or complaints today. She continues to tolerate the compression wrap well. 2/16; patient presents for follow-up. She has no issues or complaints today. 2/23; patient presents for follow-up. She has no issues or complaints today. Tomorrow she has an appointment with oncology to look at the suspicious lesion on her right lower extremity. She currently denies signs of infection. 3/2; patient presents for follow-up. She has been using her juxta light compression to the right lower extremity however there has been increased swelling and opening of wounds throughout the legs with weeping. She currently denies signs of infection. She had no issues with the compression wrap to the left lower extremity. 3/16; patient presents for follow-up. She has no issues or complaints today. She states that she has been restarted on infusions for her squamous cell carcinoma of her legs. She has also been increased on Lasix to 40 mg twice daily. She has noticed a decrease in swelling to her legs. 3/23; patient presents for follow-up. She starts her second infusion next week for her squamous cell carcinoma lesions to her legs. She reports no issues with the compression wraps. 3/30; patient presents for follow-up. She has no issues or complaints today. 4/6; patient presents for follow-up. Her left lower extremity wounds have healed. She has her juxta light compression with her today. She has no issues or complaints today. 4/13;  this is a patient we actually discharged to juxta lite stockings on the left leg the last time she was here. She came in on Monday for [place] to look at the leg and a nurse visit. She had massive swelling and numerous blisters and wound reopening. We put her back in 3 layer compression although I did not generate a note. Silver alginate on the wounded areas. We have also been doing the same on the right. In the meantime she is obtained her external compression pumps that were previously ordered and she started to use them. She has skin cancers that are obvious on the right leg x2 her appointment with Dr. Jarome Matin of dermatology is on May 5.. She is also receiving weekly chemotherapy for recurrent presumably squamous cell carcinomas apparently has had 2 treatments 4/20; patient presents for follow-up. She has her new juxta lite compressions today. She continues to have open wounds to the left  lower extremity. She has follow-up with dermatology in 2 weeks. She continues to have IV infusions for her squamous cell carcinoma lesions on her legs. 4/28; the patient has bilateral lower extremity wounds predominantly on the right lateral upper leg, right anterior lower leg which in itself is probably a recurrent cancer as well as the left lateral lower leg. She has recurrent squamous cell carcinoma currently being treated with an IV infusion. She also has scattered nodules on her upper lower legs and thighs I am not certain of all of this is felt to be malignant as well We have been using silver alginate on the open areas wrapping her legs. She also has her external compression pumps at home but I am not clear that she is going to use these 5/2; patient presents for follow-up. She has not been taking her diuretics as prescribed. She sees Dr. Dian Situ, dermatology at the end of the week. She tolerated the compression wraps well today. She has her juxta lite compressions. 5/8; patient presents for follow-up. She saw  Dr. Dian Situ, dermatology and she has nothing to report on. She has been using her juxta lite compression to the right lower extremity. She has tolerated the compression wrap well in the left lower extremity. She has not been taking her diuretic. She is scheduled to continue her infusions for her squamous cell carcinoma on her legs. 5/18; patient presents for follow-up. We wrapped her in 3 layer compression at last clinic visit. She has no issues or complaints today. 5/22; patient presents for follow-up. She has been wrapped in 3 layer compression bilaterally to her lower extremities over the past week. She has been scratching at the top of the wrap and picking at her skin. This area has increased warmth and erythema. 5/30; patient presents for follow-up. She completed Keflex with resolution of her symptoms to the right lower extremity. She no longer has increased warmth or erythema. She continues her infusions for her squamous cell carcinoma lesions. We continue to wrap her in 3 layer compression with silver alginate. She currently denies signs of infection. 6/8; the patient went to see Dr. Ronnald Ramp of dermatology 2 days ago unfortunately we do not have his notes. They removed her compression wraps of course but did not adequately replace them. She comes in today with 3 wounds on the right lateral upper calf 1 medially below the obvious squamous cell carcinoma there is a large necrotic wound on the left lateral tibial area on the left and several blisters. The patient remains on Cemiplimad infusions for the squamous cell carcinoma bilaterally. She is not felt to be a good candidate for radiation. Some of the skin changes superiorly in the upper legs bilaterally according to Dr. Ronnald Ramp related to these infusions the same like fleshy nodules to me not blisters 6/13; patient presents for follow-up. We have been using silver alginate under 3 layer compression bilaterally. She has no issues or complaints  today. 6/19; patient presents for follow-up. She will see her oncologist in 3 days and next week her dermatologist. She has no new complaints today. We are using silver alginate under 3 layer compression bilaterally. 6/29; patient presents for follow-up. She she saw Dr. Darrin Nipper last week and States she received injections to the lesions on her legs. We will ask for office visit records. She could not be wrapped with compression and has more drainage on exam today. 7/6; patient presents for follow-up. She continues to receive injections to the lesions in her legs by  her dermatologist. She had this done earlier today. She has no issues or complaints today. 7/13; patient presents for follow-up. She is following with Dr.Kavuri once weekly for treatment of her SCCs/KAs with [id number]. We have been wrapping her with silver alginate under Kerlix/Coban. She has no issues or complaints today. 7/20; patient presents for follow-up. She has elected to stop doing weekly treatments for her squamous cell carcinoma lesions. She states she experiences too much pain from the injections. She is continuing her immunotherapy infusions. Next 1 is scheduled in August. T the legs we have been using silver alginate o under Kerlix/Coban. Her wounds actually look better today. 7/27; patient presents for follow-up. We have been using Medihoney and silver alginate under compression therapy. She has no issues or complaints today. 8/1; patient presents for follow-up. We have been using Medihoney and silver alginate under compression therapy. She reports no issues. She is scheduled for her infusion for her SCC later this week. Patient History Information obtained from Patient. Family History Unknown History. Social History Never smoker, Marital Status - Single, Alcohol Use - Never, Drug Use - No History, Caffeine Use - Never. Medical History Eyes Denies history of Cataracts, Optic Neuritis Ear/Nose/Mouth/Throat Denies  history of Chronic sinus problems/congestion, Middle ear problems Hematologic/Lymphatic Patient has history of Anemia Denies history of Hemophilia, Human Immunodeficiency Virus, Lymphedema, Sickle Cell Disease Respiratory Denies history of Aspiration, Asthma, Chronic Obstructive Pulmonary Disease (COPD), Pneumothorax, Sleep Apnea, Tuberculosis Cardiovascular Patient has history of Arrhythmia - Atrial Flutter, A fibb, Congestive Heart Failure, Hypertension Denies history of Angina, Coronary Artery Disease, Deep Vein Thrombosis, Hypotension, Myocardial Infarction, Peripheral Arterial Disease, Peripheral Venous Disease, Phlebitis, Vasculitis Gastrointestinal Patient has history of Colitis Denies history of Cirrhosis , Crohnoos, Hepatitis A, Hepatitis B, Hepatitis C Endocrine Denies history of Type I Diabetes, Type II Diabetes Genitourinary Denies history of End Stage Renal Disease Immunological Denies history of Lupus Erythematosus, Raynaudoos, Scleroderma Integumentary (Skin) Denies history of History of Burn Musculoskeletal Patient has history of Osteoarthritis Denies history of Gout, Rheumatoid Arthritis, Osteomyelitis Neurologic Denies history of Dementia, Neuropathy, Quadriplegia, Paraplegia, Seizure Disorder Oncologic Denies history of Received Chemotherapy, Received Radiation Hospitalization/Surgery History - removal of rod left leg. 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 Vitals Time Taken: 1:50 PM, Height: 65 in, Weight: 163 lbs, BMI: 27.1, Temperature: 98.2 F, Pulse: 80 bpm, Respiratory Rate: 22 breaths/min, Blood Pressure: 159/82 mmHg. Cardiovascular 2+ dorsalis pedis/posterior tibialis pulses. Psychiatric pleasant and cooperative. General  Notes: Few small scattered open wounds to her lower extremities bilaterally limited to skin breakdown. 3 areas suspicious for malignancy. Integumentary (Hair, Skin) Wound #20 status is Open. Original cause of wound was Gradually Appeared. The date acquired was: 06/25/2021. The wound has been in treatment 7 weeks. The wound is located on the Right,Circumferential Lower Leg. The wound measures 28cm length x 14cm width x 0.1cm depth; 307.876cm^2 area and 30.788cm^3 volume. There is Fat Layer (Subcutaneous Tissue) exposed. There is no tunneling or undermining noted. There is a medium amount of serosanguineous drainage noted. The wound margin is distinct with the outline attached to the wound base. There is large (67-100%) pink granulation within the wound bed. There is a small (1-33%) amount of necrotic tissue within the wound bed including Adherent Slough. Wound #7RR status is Open. Original cause of wound was Blister. The date acquired was: 04/20/2020. The wound has been in treatment  62 weeks. The wound is located on the Left,Circumferential Lower Leg. The wound measures 2cm length x 9cm width x 0.1cm depth; 14.137cm^2 area and 1.414cm^3 volume. There is Fat Layer (Subcutaneous Tissue) exposed. There is no tunneling or undermining noted. There is a medium amount of serosanguineous drainage noted. The wound margin is distinct with the outline attached to the wound base. There is large (67-100%) red, pink granulation within the wound bed. There is no necrotic tissue within the wound bed. Assessment Active Problems ICD-10 Squamous cell carcinoma of skin, unspecified Non-pressure chronic ulcer of other part of right lower leg with fat layer exposed Non-pressure chronic ulcer of other part of left lower leg with fat layer exposed Lymphedema, not elsewhere classified Chronic venous hypertension (idiopathic) with ulcer of bilateral lower extremity Patient's wounds have shown improvement in size and appearance  since last clinic visit. Her swelling has improved greatly since she has been in a skilled nursing facility. She is able to elevate her legs and taking her Lasix as prescribed. She still has some areas present that are squamous cell carcinoma. We will continue with silver alginate and Medihoney under compression therapy. Procedures Wound #20 Pre-procedure diagnosis of Wound #20 is a Lymphedema located on the Right,Circumferential Lower Leg . There was a Three Layer Compression Therapy Procedure by Deon Pilling, RN. Post procedure Diagnosis Wound #20: Same as Pre-Procedure Wound #7RR Pre-procedure diagnosis of Wound #7RR is a Malignant Wound located on the Left,Circumferential Lower Leg . There was a Three Layer Compression Therapy Procedure by Deon Pilling, RN. Post procedure Diagnosis Wound #7RR: Same as Pre-Procedure Plan Follow-up Appointments: Return Appointment in 2 weeks. - Dr. Heber [place] [facility] [place], Room 8 09/05/2021 Thursday 130pm Nurse Visit: - 08/29/2021 Thursday 130 pm 09/10/2021 Tuesday 130pm Other: - Bring in your lotion from home to apply under the compression wraps. CETAPHIL LOTION Bathing/ Shower/ Hygiene: May shower with protection but do not get wound dressing(s) wet. - Use cast protector Edema Control - Lymphedema / SCD / Other: Lymphedema Pumps. Use Lymphedema pumps on leg(s) 2-3 times a day for 45-60 minutes. If wearing any wraps or hose, do not remove them. Continue exercising as instructed. - use one hour each time twice a day. Elevate legs to the level of the heart or above for 30 minutes daily and/or when sitting, a frequency of: - elevate the legs throughout the day heart level if possible. Avoid standing for long periods of time. Exercise regularly Additional Orders / Instructions: Follow Nutritious Diet WOUND #20: - Lower Leg Wound Laterality: Right, Circumferential Cleanser: Soap and Water 1 x Per Week/30 Days Discharge Instructions: May shower and wash wound  with dial antibacterial soap and water prior to dressing change. Cleanser: Wound Cleanser 1 x Per Week/30 Days Discharge Instructions: Cleanse the wound with wound cleanser prior to applying a clean dressing using gauze sponges, not tissue or cotton balls. Prim Dressing: Maxorb Extra Calcium Alginate Dressing, 4x4 in 1 x Per Week/30 Days ary Discharge Instructions: Apply calcium alginate to wound bed as instructed Prim Dressing: MediHoney Gel, tube 1.5 (oz) 1 x Per Week/30 Days ary Discharge Instructions: Apply to wound bed as instructed Secondary Dressing: ABD Pad, 8x10 1 x Per Week/30 Days Discharge Instructions: Apply over primary dressing as directed. Secondary Dressing: CarboFLEX Odor Control Dressing, 4x4 in 1 x Per Week/30 Days Discharge Instructions: Apply over primary dressing as directed. Secondary Dressing: Woven Gauze Sponge, Non-Sterile 4x4 in 1 x Per Week/30 Days Discharge Instructions: Apply over primary dressing as directed.  Com pression Wrap: ThreePress (3 layer compression wrap) 1 x Per Week/30 Days Discharge Instructions: Apply three layer compression as directed. WOUND #7RR: - Lower Leg Wound Laterality: Left, Circumferential Cleanser: Soap and Water 1 x Per Week/30 Days Discharge Instructions: May shower and wash wound with dial antibacterial soap and water prior to dressing change. Cleanser: Wound Cleanser 1 x Per Week/30 Days Discharge Instructions: Cleanse the wound with wound cleanser prior to applying a clean dressing using gauze sponges, not tissue or cotton balls. Prim Dressing: Maxorb Extra Calcium Alginate Dressing, 4x4 in 1 x Per Week/30 Days ary Discharge Instructions: Apply calcium alginate to wound bed as instructed Prim Dressing: MediHoney Gel, tube 1.5 (oz) 1 x Per Week/30 Days ary Discharge Instructions: Apply to wound bed as instructed Secondary Dressing: ABD Pad, 8x10 1 x Per Week/30 Days Discharge Instructions: Apply over primary dressing as  directed. Secondary Dressing: CarboFLEX Odor Control Dressing, 4x4 in 1 x Per Week/30 Days Discharge Instructions: Apply over primary dressing as directed. Secondary Dressing: Woven Gauze Sponge, Non-Sterile 4x4 in 1 x Per Week/30 Days Discharge Instructions: Apply over primary dressing as directed. Com pression Wrap: ThreePress (3 layer compression wrap) 1 x Per Week/30 Days Discharge Instructions: Apply three layer compression as directed. 1. Medihoney with silver alginate under Kerlix/Coban to lower extremities bilaterally 2. Follow-up in 1 week for nurse visit and following week with me Electronic Signature(s) Signed: 08/20/2021 3:04:33 PM By: Kalman Shan DO Entered By: Kalman Shan on 08/20/2021 14:56:40 -------------------------------------------------------------------------------- HxROS Details Patient Name: Date of Service: [name], [place]. 08/20/2021 1:30 PM Medical Record Number: [id number] Patient Account Number: [id number] Date of Birth/Sex: Treating RN: Apr 05, 1925 (88 y.o. [name] Primary Care Provider: [name] Other Clinician: Referring Provider: Treating Provider/Extender: [name] in Treatment: 33 Information Obtained From Patient Eyes Medical History: Negative for: Cataracts; Optic Neuritis Ear/Nose/Mouth/Throat Medical History: Negative for: Chronic sinus problems/congestion; Middle ear problems Hematologic/Lymphatic Medical History: Positive for: Anemia Negative for: Hemophilia; Human Immunodeficiency Virus; Lymphedema; Sickle Cell Disease Respiratory Medical History: Negative for: Aspiration; Asthma; Chronic Obstructive Pulmonary Disease (COPD); Pneumothorax; Sleep Apnea; Tuberculosis Cardiovascular Medical History: Positive for: Arrhythmia - Atrial Flutter, A fibb; Congestive Heart Failure; Hypertension Negative for: Angina; Coronary Artery Disease; Deep Vein Thrombosis; Hypotension;  Myocardial Infarction; Peripheral Arterial Disease; Peripheral Venous Disease; Phlebitis; Vasculitis Past Medical History Notes: hyperlipidemia Gastrointestinal Medical History: Positive for: Colitis Negative for: Cirrhosis ; Crohns; Hepatitis A; Hepatitis B; Hepatitis C Endocrine Medical History: Negative for: Type I Diabetes; Type II Diabetes Past Medical History Notes: hypothyroidism Genitourinary Medical History: Negative for: End Stage Renal Disease Immunological Medical History: Negative for: Lupus Erythematosus; Raynauds; Scleroderma Integumentary (Skin) Medical History: Negative for: History of Burn Musculoskeletal Medical History: Positive for: Osteoarthritis Negative for: Gout; Rheumatoid Arthritis; Osteomyelitis Neurologic Medical History: Negative for: Dementia; Neuropathy; Quadriplegia; Paraplegia; Seizure Disorder Past Medical History Notes: lumbar spindylolysis Oncologic Medical History: Negative for: Received Chemotherapy; Received Radiation Past Medical History Notes: BLE squamous ceel carcionoma Immunizations Pneumococcal Vaccine: Received Pneumococcal Vaccination: Yes Received Pneumococcal Vaccination On or After 60th Birthday: No Implantable Devices None Hospitalization / Surgery History Type of Hospitalization/Surgery removal of rod left leg Family and Social History Unknown History: Yes; Never smoker; Marital Status - Single; Alcohol Use: Never; Drug Use: No History; Caffeine Use: Never; Financial Concerns: No; Food, Clothing or Shelter Needs: No; Support System Lacking: No; Transportation Concerns: No Electronic Signature(s) Signed: 08/20/2021 3:04:33 PM By: Kalman Shan DO Signed: 08/20/2021 4:41:40 PM By: Deon Pilling RN,  BSN Entered By: Kalman Shan on 08/20/2021 14:54:54 -------------------------------------------------------------------------------- [place] Details Patient Name: Date of Service: [name], Cameka A.  08/20/2021 Medical Record Number: [id number] Patient Account Number: [id number] Date of Birth/Sex: Treating RN: [birth year] (88 y.o. [name], [name] Primary Care Provider: [name] Other Clinician: Referring Provider: Treating Provider/Extender: [name] in Treatment: 44 Diagnosis Coding ICD-10 Codes Code Description C44.92 Squamous cell carcinoma of skin, unspecified L97.812 Non-pressure chronic ulcer of other part of right lower leg with fat layer exposed L97.822 Non-pressure chronic ulcer of other part of left lower leg with fat layer exposed I89.0 Lymphedema, not elsewhere classified I87.313 Chronic venous hypertension (idiopathic) with ulcer of bilateral lower extremity Facility Procedures CPT4: Code [id number] Description: 81 BILATERAL: Application of multi-layer venous compression system; leg (below knee), including ankle and t. Modifier: Quantity: 1 Physician Procedures : CPT4 Code Description Modifier [id number] - WC PHYS LEVEL 3 - EST PT ICD-10 Diagnosis Description C44.92 Squamous cell carcinoma of skin, unspecified L97.812 Non-pressure chronic ulcer of other part of right lower leg with fat layer exposed L97.822  Non-pressure chronic ulcer of other part of left lower leg with fat layer exposed I87.313 Chronic venous hypertension (idiopathic) with ulcer of bilateral lower extremity Quantity: 1 Electronic Signature(s) Signed: 08/20/2021 3:04:33 PM By: Kalman Shan DO Entered By: Kalman Shan on 08/20/2021 14:58:22

## 2021-08-21 DIAGNOSIS — R2689 Other abnormalities of gait and mobility: Secondary | ICD-10-CM | POA: Diagnosis not present

## 2021-08-21 DIAGNOSIS — M6281 Muscle weakness (generalized): Secondary | ICD-10-CM | POA: Diagnosis not present

## 2021-08-21 DIAGNOSIS — N1831 Chronic kidney disease, stage 3a: Secondary | ICD-10-CM | POA: Diagnosis not present

## 2021-08-21 DIAGNOSIS — E860 Dehydration: Secondary | ICD-10-CM | POA: Diagnosis not present

## 2021-08-21 DIAGNOSIS — R278 Other lack of coordination: Secondary | ICD-10-CM | POA: Diagnosis not present

## 2021-08-22 ENCOUNTER — Inpatient Hospital Stay: Payer: Medicare Other | Attending: Oncology

## 2021-08-22 ENCOUNTER — Inpatient Hospital Stay (HOSPITAL_BASED_OUTPATIENT_CLINIC_OR_DEPARTMENT_OTHER): Payer: Medicare Other | Admitting: Oncology

## 2021-08-22 ENCOUNTER — Other Ambulatory Visit: Payer: Self-pay

## 2021-08-22 ENCOUNTER — Inpatient Hospital Stay: Payer: Medicare Other

## 2021-08-22 VITALS — BP 124/77 | HR 68 | Temp 97.5°F | Ht 65.0 in | Wt 152.1 lb

## 2021-08-22 VITALS — Resp 19

## 2021-08-22 DIAGNOSIS — C44722 Squamous cell carcinoma of skin of right lower limb, including hip: Secondary | ICD-10-CM | POA: Insufficient documentation

## 2021-08-22 DIAGNOSIS — C44721 Squamous cell carcinoma of skin of unspecified lower limb, including hip: Secondary | ICD-10-CM

## 2021-08-22 DIAGNOSIS — M6281 Muscle weakness (generalized): Secondary | ICD-10-CM | POA: Diagnosis not present

## 2021-08-22 DIAGNOSIS — Z5112 Encounter for antineoplastic immunotherapy: Secondary | ICD-10-CM | POA: Diagnosis not present

## 2021-08-22 DIAGNOSIS — R278 Other lack of coordination: Secondary | ICD-10-CM | POA: Diagnosis not present

## 2021-08-22 DIAGNOSIS — C44729 Squamous cell carcinoma of skin of left lower limb, including hip: Secondary | ICD-10-CM | POA: Insufficient documentation

## 2021-08-22 DIAGNOSIS — Z79899 Other long term (current) drug therapy: Secondary | ICD-10-CM | POA: Insufficient documentation

## 2021-08-22 DIAGNOSIS — E039 Hypothyroidism, unspecified: Secondary | ICD-10-CM

## 2021-08-22 DIAGNOSIS — R2689 Other abnormalities of gait and mobility: Secondary | ICD-10-CM | POA: Diagnosis not present

## 2021-08-22 DIAGNOSIS — C4490 Unspecified malignant neoplasm of skin, unspecified: Secondary | ICD-10-CM

## 2021-08-22 LAB — CBC WITH DIFFERENTIAL (CANCER CENTER ONLY)
Abs Immature Granulocytes: 0.01 10*3/uL (ref 0.00–0.07)
Basophils Absolute: 0.1 10*3/uL (ref 0.0–0.1)
Basophils Relative: 1 %
Eosinophils Absolute: 0.2 10*3/uL (ref 0.0–0.5)
Eosinophils Relative: 3 %
HCT: 33.1 % — ABNORMAL LOW (ref 36.0–46.0)
Hemoglobin: 10.6 g/dL — ABNORMAL LOW (ref 12.0–15.0)
Immature Granulocytes: 0 %
Lymphocytes Relative: 11 %
Lymphs Abs: 0.8 10*3/uL (ref 0.7–4.0)
MCH: 33.8 pg (ref 26.0–34.0)
MCHC: 32 g/dL (ref 30.0–36.0)
MCV: 105.4 fL — ABNORMAL HIGH (ref 80.0–100.0)
Monocytes Absolute: 0.8 10*3/uL (ref 0.1–1.0)
Monocytes Relative: 12 %
Neutro Abs: 4.8 10*3/uL (ref 1.7–7.7)
Neutrophils Relative %: 73 %
Platelet Count: 285 10*3/uL (ref 150–400)
RBC: 3.14 MIL/uL — ABNORMAL LOW (ref 3.87–5.11)
RDW: 14.1 % (ref 11.5–15.5)
WBC Count: 6.6 10*3/uL (ref 4.0–10.5)
nRBC: 0 % (ref 0.0–0.2)

## 2021-08-22 LAB — CMP (CANCER CENTER ONLY)
ALT: 14 U/L (ref 0–44)
AST: 30 U/L (ref 15–41)
Albumin: 3.5 g/dL (ref 3.5–5.0)
Alkaline Phosphatase: 72 U/L (ref 38–126)
Anion gap: 6 (ref 5–15)
BUN: 18 mg/dL (ref 8–23)
CO2: 33 mmol/L — ABNORMAL HIGH (ref 22–32)
Calcium: 9 mg/dL (ref 8.9–10.3)
Chloride: 100 mmol/L (ref 98–111)
Creatinine: 0.88 mg/dL (ref 0.44–1.00)
GFR, Estimated: >60 mL/min (ref >60)
Glucose, Bld: 116 mg/dL — ABNORMAL HIGH (ref 70–99)
Potassium: 3.7 mmol/L (ref 3.5–5.1)
Sodium: 139 mmol/L (ref 135–145)
Total Bilirubin: 0.7 mg/dL (ref 0.3–1.2)
Total Protein: 7.8 g/dL (ref 6.5–8.1)

## 2021-08-22 LAB — TSH: TSH: 1.805 u[IU]/mL (ref 0.350–4.500)

## 2021-08-22 MED ORDER — SODIUM CHLORIDE 0.9 % IV SOLN: Freq: Once | INTRAVENOUS | Status: AC

## 2021-08-22 MED ORDER — SODIUM CHLORIDE 0.9 % IV SOLN
350.0000 mg | Freq: Once | INTRAVENOUS | Status: AC
  Administered 2021-08-22: 350 mg via INTRAVENOUS
  Filled 2021-08-22: qty 7

## 2021-08-22 NOTE — Progress Notes (Signed)
Hematology and Oncology Follow Up Visit  DOCIE ABRAMOVICH [id number] [birth year] 88 y.o. 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  She developed in March 2023 recurrent disease.   Prior Therapy:  She received Libtayo for 5 cycles concluded in September 2022.  Current therapy:  Libtayo 350 mg total dose restarted on March 26, 2021.  He is here for cycle 8 of therapy.  Interim History: Ms. Banker returns today for a repeat evaluation.  Since the last visit, he reports no major changes in her health.  She continues to tolerate diet without any major issues.  She denies any nausea, fatigue or skin rash.  She denies any pruritus or difficulty breathing.  Her performance status quality of life is limited but not dramatically changed.    Medications: Updated on review. Current Outpatient Medications  Medication Sig Dispense Refill   acetaminophen (TYLENOL) 500 MG tablet Take 500 mg by mouth daily as needed for mild pain.      apixaban (ELIQUIS) 5 MG TABS tablet Take 1 tablet (5 mg total) by mouth 2 (two) times daily. NEEDS CARDIOLOGY APPOINTMENT FOR ELIQUIS REFILLS, PLEASE CALL OFFICE 60 tablet 0   B Complex-C (B-COMPLEX WITH VITAMIN C) tablet Take 1 tablet by mouth daily.     busPIRone (BUSPAR) 15 MG tablet TAKE 1 TABLET BY MOUTH TWICE DAILY 180 tablet 3   CEMIPLIMAB-RWLC IV Inject 1 Dose into the vein every 21 ( twenty-one) days.     Cholecalciferol (VITAMIN D3) 1000 UNITS tablet Take 1,000 Units by mouth daily.     colestipol (COLESTID) 1 g tablet Take 1 tablet (1 g total) by mouth in the morning. 60 tablet 12   diltiazem (CARDIZEM CD) 120 MG 24 hr capsule TAKE 1 CAPSULE BY MOUTH ONCE DAILY 30 capsule 11   doxycycline (VIBRA-TABS) 100 MG tablet TAKE 1 TABLET BY MOUTH ONCE DAILY 30 tablet 11   famotidine (PEPCID) 20 MG tablet Take 20  mg by mouth daily.     furosemide (LASIX) 40 MG tablet Take 1 tablet (40 mg total) by mouth 2 (two) times daily. 60 tablet 5   gabapentin (NEURONTIN) 100 MG capsule TAKE 1 CAPSULE BY MOUTH EVERY NIGHT AT BEDTIME *DO NOT CRUSH OR CHEW* **NOTE NEW INSTRUCTIONS* 30 capsule 5   hydrocortisone 2.5 % lotion Apply topically 3 (three) times daily. 240 mL 2   levothyroxine (SYNTHROID) 25 MCG tablet TAKE 1 TABLET BY MOUTH ONCE DAILY FOR THYROID *TAKE ON AN EMPTY STOMACH* 30 tablet 11   loperamide (IMODIUM) 2 MG capsule Take 1 capsule (2 mg total) by mouth as needed for diarrhea or loose stools. Take 1 tab with first loose stool then one tab after each loose stool for maximum dose of 8 tabs daily. Pt to call the office if need for greater then 8 tabs a day 60 capsule 0   methylPREDNISolone (MEDROL DOSEPAK) 4 MG TBPK tablet As directed 21 tablet 0   metoprolol succinate (TOPROL-XL) 50 MG 24 hr tablet TAKE 1/2 TABLET = 25 MG BY MOUTH TWICE DAILY. TAKE WITH OR IMMEDIATELY FOLLOWING A MEAL 30 tablet 10   Multiple Vitamins-Minerals (MULTIVITAMIN WITH MINERALS) tablet Take 1 tablet by mouth daily.     polyethylene glycol (MIRALAX) 17 g packet Take 17 g by mouth daily. 30 each 0   potassium chloride SA (KLOR-CON M) 20 MEQ tablet TAKE 1  TABLET BY MOUTH ONCE DAILY  ALONG W/ FUROSEMIDE AS NEED *TAKE WITH FOOD* *DO NOT CRUSH OR CHEW* *MAY DISSOLVE* 30 tablet 11   prochlorperazine (COMPAZINE) 5 MG tablet Take 1 tablet (5 mg total) by mouth every 8 (eight) hours as needed for nausea or vomiting. 20 tablet 1   temazepam (RESTORIL) 15 MG capsule TAKE 1 CAPSULE BY MOUTH EVERY NIGHT AT BEDTIME AS NEEDED FOR SLEEP 30 capsule 3   No current facility-administered medications for this visit.     Allergies:  Allergies  Allergen Reactions   Zosyn [Piperacillin Sod-Tazobactam So] Hives, Itching, Rash and Other (See Comments)    Morbilliform eruptions with itching- looks like Measles   Atorvastatin     myalgias   Atenolol  Nausea And Vomiting   Clarithromycin Nausea And Vomiting   Codeine Sulfate Nausea Only   Levaquin [Levofloxacin] Nausea Only   Macrodantin Other (See Comments)    UNSPECIFIED    Oxycodone-Acetaminophen Nausea And Vomiting      Physical Exam:    Blood pressure 124/77, pulse 68, temperature (!) 97.5 F (36.4 C), temperature source Oral, height '5\' 5"'$  (1.651 m), weight 152 lb 1.6 oz (69 kg), SpO2 98 %.    ECOG: 2   General appearance: Alert, awake without any distress. Head: Atraumatic without abnormalities Oropharynx: Without any thrush or ulcers. Eyes: No scleral icterus. Lymph nodes: No lymphadenopathy noted in the cervical, supraclavicular, or axillary nodes Heart:regular rate and rhythm, without any murmurs or gallops.   Lung: Clear to auscultation without any rhonchi, wheezes or dullness to percussion. Abdomin: Soft, nontender without any shifting dullness or ascites. Musculoskeletal: No clubbing or cyanosis. Neurological: No motor or sensory deficits. Skin: Unchanged to have protrusions noted on her right upper arm.  Lower extremity lesions currently not evaluated.  Dressing was not removed during today's visit.      Lab Results: Lab Results  Component Value Date   WBC 6.2 07/31/2021   HGB 9.6 (L) 07/31/2021   HCT 30.1 (L) 07/31/2021   MCV 105.2 (H) 07/31/2021   PLT 311 07/31/2021     Chemistry      Component Value Date/Time   NA 135 07/31/2021 1120   NA 134 08/11/2018 1425   NA 137 07/28/2016 1334   K 4.4 07/31/2021 1120   K 3.8 07/28/2016 1334   CL 103 07/31/2021 1120   CO2 26 07/31/2021 1120   CO2 28 07/28/2016 1334   BUN 15 07/31/2021 1120   BUN 20 08/11/2018 1425   BUN 9.5 07/28/2016 1334   CREATININE 1.20 (H) 07/31/2021 1120   CREATININE 1.0 07/28/2016 1334      Component Value Date/Time   CALCIUM 9.1 07/31/2021 1120   CALCIUM 9.6 07/28/2016 1334   ALKPHOS 48 07/31/2021 1120   ALKPHOS 96 07/28/2016 1334   AST 25 07/31/2021 1120   AST 21  07/28/2016 1334   ALT 10 07/31/2021 1120   ALT 24 07/28/2016 1334   BILITOT 0.8 07/31/2021 1120   BILITOT 1.21 (H) 07/28/2016 1334         Impression and Plan:  88 year old with:  1.  Locally advanced squamous cell carcinoma of the skin involving bilateral lower extremities diagnosed in June 2022.    Risks and benefits of continuing lipid A were discussed at this time.  She has not experienced any major complications at this time.  She is experiencing possible benefit or have recommended the same dose and schedule.  Alternative treatment options including systemic chemotherapy, Erbitux among  others.  She is agreeable to continue at this time.   2.  Breast cancer: No active treatment needed at this time without any evidence of relapse.  3.  Nonhealing wounds in the lower extremities: Managed by the wound clinic on a weekly basis.      4.  Follow-up: In 3 weeks for repeat evaluation.      30 minutes were spent on this visit.  The time was dedicated to reviewing laboratory data, disease status update and outlining future plan of care discussion.   Zola Button, MD 8/3/202311:36 AM

## 2021-08-22 NOTE — Patient Instructions (Signed)
[facility]  Discharge Instructions: Thank you for choosing [facility] to provide your oncology and hematology care.   If you have a lab appointment with the Cancer Center, please go directly to the Cancer Center and check in at the registration area.   Wear comfortable clothing and clothing appropriate for easy access to any Portacath or PICC line.   We strive to give you quality time with your provider. You may need to reschedule your appointment if you arrive late (15 or more minutes).  Arriving late affects you and other patients whose appointments are after yours.  Also, if you miss three or more appointments without notifying the office, you may be dismissed from the clinic at the provider's discretion.      For prescription refill requests, have your pharmacy contact our office and allow 72 hours for refills to be completed.    Today you received the following chemotherapy and/or immunotherapy agents: Libtayo      To help prevent nausea and vomiting after your treatment, we encourage you to take your nausea medication as directed.  BELOW ARE SYMPTOMS THAT SHOULD BE REPORTED IMMEDIATELY: *FEVER GREATER THAN 100.4 F (38 C) OR HIGHER *CHILLS OR SWEATING *NAUSEA AND VOMITING THAT IS NOT CONTROLLED WITH YOUR NAUSEA MEDICATION *UNUSUAL SHORTNESS OF BREATH *UNUSUAL BRUISING OR BLEEDING *URINARY PROBLEMS (pain or burning when urinating, or frequent urination) *BOWEL PROBLEMS (unusual diarrhea, constipation, pain near the anus) TENDERNESS IN MOUTH AND THROAT WITH OR WITHOUT PRESENCE OF ULCERS (sore throat, sores in mouth, or a toothache) UNUSUAL RASH, SWELLING OR PAIN  UNUSUAL VAGINAL DISCHARGE OR ITCHING   Items with * indicate a potential emergency and should be followed up as soon as possible or go to the Emergency Department if any problems should occur.  Please show the CHEMOTHERAPY ALERT CARD or IMMUNOTHERAPY ALERT CARD at check-in to  the Emergency Department and triage nurse.  Should you have questions after your visit or need to cancel or reschedule your appointment, please contact [facility]  Dept: [phone]  and follow the prompts.  Office hours are 8:00 a.m. to 4:30 p.m. Monday - Friday. Please note that voicemails left after 4:00 p.m. may not be returned until the following business day.  We are closed weekends and major holidays. You have access to a nurse at all times for urgent questions. Please call the main number to the clinic Dept: [phone] and follow the prompts.   For any non-urgent questions, you may also contact your provider using MyChart. We now offer e-Visits for anyone 18 and older to request care online for non-urgent symptoms. For details visit [email or web address].   Also download the MyChart app! Go to the app store, search "MyChart", open the app, select [facility], and log in with your MyChart username and password.  Masks are optional in the cancer centers. If you would like for your care team to wear a mask while they are taking care of you, please let them know. You may have one support person who is at least 86 years old accompany you for your appointments. 

## 2021-08-23 ENCOUNTER — Telehealth: Payer: Self-pay | Admitting: Oncology

## 2021-08-23 DIAGNOSIS — M6281 Muscle weakness (generalized): Secondary | ICD-10-CM | POA: Diagnosis not present

## 2021-08-23 DIAGNOSIS — R278 Other lack of coordination: Secondary | ICD-10-CM | POA: Diagnosis not present

## 2021-08-23 DIAGNOSIS — R2689 Other abnormalities of gait and mobility: Secondary | ICD-10-CM | POA: Diagnosis not present

## 2021-08-23 NOTE — Telephone Encounter (Signed)
Scheduled per 08/03 los, patient has been called and voicemail was left.

## 2021-08-24 ENCOUNTER — Other Ambulatory Visit: Payer: Self-pay

## 2021-08-26 ENCOUNTER — Encounter (HOSPITAL_BASED_OUTPATIENT_CLINIC_OR_DEPARTMENT_OTHER): Payer: Medicare Other | Admitting: Internal Medicine

## 2021-08-26 DIAGNOSIS — F5101 Primary insomnia: Secondary | ICD-10-CM | POA: Diagnosis not present

## 2021-08-26 DIAGNOSIS — M6281 Muscle weakness (generalized): Secondary | ICD-10-CM | POA: Diagnosis not present

## 2021-08-26 DIAGNOSIS — F419 Anxiety disorder, unspecified: Secondary | ICD-10-CM | POA: Diagnosis not present

## 2021-08-26 DIAGNOSIS — F331 Major depressive disorder, recurrent, moderate: Secondary | ICD-10-CM | POA: Diagnosis not present

## 2021-08-26 DIAGNOSIS — R2689 Other abnormalities of gait and mobility: Secondary | ICD-10-CM | POA: Diagnosis not present

## 2021-08-26 DIAGNOSIS — R278 Other lack of coordination: Secondary | ICD-10-CM | POA: Diagnosis not present

## 2021-08-27 DIAGNOSIS — M6281 Muscle weakness (generalized): Secondary | ICD-10-CM | POA: Diagnosis not present

## 2021-08-27 DIAGNOSIS — R2689 Other abnormalities of gait and mobility: Secondary | ICD-10-CM | POA: Diagnosis not present

## 2021-08-27 DIAGNOSIS — R278 Other lack of coordination: Secondary | ICD-10-CM | POA: Diagnosis not present

## 2021-08-28 DIAGNOSIS — R278 Other lack of coordination: Secondary | ICD-10-CM | POA: Diagnosis not present

## 2021-08-28 DIAGNOSIS — R2689 Other abnormalities of gait and mobility: Secondary | ICD-10-CM | POA: Diagnosis not present

## 2021-08-28 DIAGNOSIS — M6281 Muscle weakness (generalized): Secondary | ICD-10-CM | POA: Diagnosis not present

## 2021-08-29 ENCOUNTER — Other Ambulatory Visit: Payer: Self-pay

## 2021-08-29 ENCOUNTER — Encounter (HOSPITAL_BASED_OUTPATIENT_CLINIC_OR_DEPARTMENT_OTHER): Payer: Medicare Other | Admitting: Internal Medicine

## 2021-08-29 DIAGNOSIS — L97822 Non-pressure chronic ulcer of other part of left lower leg with fat layer exposed: Secondary | ICD-10-CM | POA: Diagnosis not present

## 2021-08-29 DIAGNOSIS — R278 Other lack of coordination: Secondary | ICD-10-CM | POA: Diagnosis not present

## 2021-08-29 DIAGNOSIS — I890 Lymphedema, not elsewhere classified: Secondary | ICD-10-CM | POA: Diagnosis not present

## 2021-08-29 DIAGNOSIS — I87313 Chronic venous hypertension (idiopathic) with ulcer of bilateral lower extremity: Secondary | ICD-10-CM | POA: Diagnosis not present

## 2021-08-29 DIAGNOSIS — M6281 Muscle weakness (generalized): Secondary | ICD-10-CM | POA: Diagnosis not present

## 2021-08-29 DIAGNOSIS — C44722 Squamous cell carcinoma of skin of right lower limb, including hip: Secondary | ICD-10-CM | POA: Diagnosis not present

## 2021-08-29 DIAGNOSIS — L97812 Non-pressure chronic ulcer of other part of right lower leg with fat layer exposed: Secondary | ICD-10-CM | POA: Diagnosis not present

## 2021-08-29 DIAGNOSIS — C44729 Squamous cell carcinoma of skin of left lower limb, including hip: Secondary | ICD-10-CM | POA: Diagnosis not present

## 2021-08-29 DIAGNOSIS — R2689 Other abnormalities of gait and mobility: Secondary | ICD-10-CM | POA: Diagnosis not present

## 2021-08-29 NOTE — Progress Notes (Signed)
[name] ([id number]) , Visit Report for 08/29/2021 Arrival Information Details Patient Name: Date of Service: [name], [place]. 08/29/2021 1:30 PM Medical Record Number: [id number] Patient Account Number: [id number] Date of Birth/Sex: Treating RN: August 16, 1925 (88 y.o. [name], [name] Primary Care Ameenah Prosser: [name] Other Clinician: Referring Laresha Bacorn: Treating Donya Hitch/Extender: [name] in Treatment: 55 Visit Information History Since Last Visit Added or deleted any medications: No Patient Arrived: Wheel Chair Any new allergies or adverse reactions: No Arrival Time: 13:48 Had a fall or experienced change in No Accompanied By: self activities of daily living that may affect Transfer Assistance: None risk of falls: Patient Identification Verified: Yes Signs or symptoms of abuse/neglect since last visito No Secondary Verification Process Completed: Yes Hospitalized since last visit: No Patient Requires Transmission-Based No Implantable device outside of the clinic excluding No Precautions: cellular tissue based products placed in the center Patient Has Alerts: Yes since last visit: Patient Alerts: R ABI = Non compressible Has Dressing in Place as Prescribed: Yes L ABI = Non Has Compression in Place as Prescribed: Yes compressible Pain Present Now: No Electronic Signature(s) Signed: 08/29/2021 5:06:20 PM By: Deon Pilling RN, BSN Entered By: Deon Pilling on 08/29/2021 14:24:58 -------------------------------------------------------------------------------- Compression Therapy Details Patient Name: Date of Service: [name], [name] 08/29/2021 1:30 PM Medical Record Number: [id number] Patient Account Number: [id number] Date of Birth/Sex: Treating RN: 02/03/1925 (88 y.o. [name] Primary Care Mayreli Alden: [name] Other Clinician: Referring Ryhanna Dunsmore: Treating Madell Heino/Extender: [name] in Treatment: 86 Compression Therapy Performed for Wound Assessment: Wound #20 Right,Circumferential Lower Leg Performed By: Clinician Deon Pilling, RN Compression Type: Three Hydrologist) Signed: 08/29/2021 5:06:20 PM By: Deon Pilling RN, BSN Entered By: Deon Pilling on 08/29/2021 14:25:25 -------------------------------------------------------------------------------- Compression Therapy Details Patient Name: Date of Service: [name], [place]. 08/29/2021 1:30 PM Medical Record Number: [id number] Patient Account Number: [id number] Date of Birth/Sex: Treating RN: 18-Apr-1925 (88 y.o. [name] Primary Care Amayra Kiedrowski: [name] Other Clinician: Referring Andrian Sabala: Treating Reon Hunley/Extender: [name] in Treatment: 44 Compression Therapy Performed for Wound Assessment: Wound #7RR Left,Circumferential Lower Leg Performed By: Clinician Deon Pilling, RN Compression Type: Three Hydrologist) Signed: 08/29/2021 5:06:20 PM By: Deon Pilling RN, BSN Entered By: Deon Pilling on 08/29/2021 14:25:25 -------------------------------------------------------------------------------- Encounter Discharge Information Details Patient Name: Date of Service: [name], [name]. 08/29/2021 1:30 PM Medical Record Number: [id number] Patient Account Number: [id number] Date of Birth/Sex: Treating RN: 05-24-1925 (88 y.o. [name] Primary Care Darrill Vreeland: [name] Other Clinician: Referring Kalyn Dimattia: Treating Lamonta Cypress/Extender: [name] in Treatment: 42 Encounter Discharge Information Items Discharge Condition: Stable Ambulatory Status: Wheelchair Discharge Destination: Home Transportation: Private Auto Accompanied By: self Schedule Follow-up Appointment: Yes Clinical Summary of Care: Electronic Signature(s) Signed: 08/29/2021 5:06:20 PM By: Deon Pilling RN, BSN Entered By: Deon Pilling on 08/29/2021 14:25:56 -------------------------------------------------------------------------------- Patient/Caregiver Education Details Patient Name: Date of Service: [name], [name] 8/10/2023andnbsp1:30 PM Medical Record Number: [id number] Patient Account Number: [id number] Date of Birth/Gender: Treating RN: 03-23-1925 (88 y.o. [name] Primary Care Physician: [name] Other Clinician: Referring Physician: Treating Physician/Extender: [name] in Treatment: 22 Education Assessment Education Provided To: Patient Education Topics Provided Venous: Handouts: Managing Venous Disease and Related Ulcers Methods: Explain/Verbal Responses: Reinforcements needed Electronic Signature(s) Signed: 08/29/2021 5:06:20 PM By: Deon Pilling RN, BSN Entered By: Deon Pilling on  08/29/2021 14:25:45 -------------------------------------------------------------------------------- Wound Assessment Details Patient Name: Date of Service: [name], [name]. 08/29/2021 1:30 PM Medical Record Number: [id number] Patient Account Number: [id number] Date of Birth/Sex: Treating RN: 1925-12-26 (88 y.o. [name], [name] Primary Care Jovon Streetman: [name] Other Clinician: Referring Rayshon Albaugh: Treating Chea Malan/Extender: [name] in Treatment: 23 Wound Status Wound Number: 20 Primary Etiology: Lymphedema Wound Location: Right, Circumferential Lower Leg Wound Status: Open Wounding Event: Gradually Appeared Date Acquired: 06/25/2021 Weeks Of Treatment: 9 Clustered Wound: Yes Wound Measurements Length: (cm) 28 Width: (cm) 14 Depth: (cm) 0.1 Area: (cm) 307.876 Volume: (cm) 30.788 % Reduction in Area: 22.5% % Reduction in Volume: 22.5% Wound Description Classification: Full Thickness Without Exposed Support Structu Exudate Amount: Medium Exudate Type:  Serosanguineous Exudate Color: red, brown res Treatment Notes Wound #20 (Lower Leg) Wound Laterality: Right, Circumferential Cleanser Soap and Water Discharge Instruction: May shower and wash wound with dial antibacterial soap and water prior to dressing change. Wound Cleanser Discharge Instruction: Cleanse the wound with wound cleanser prior to applying a clean dressing using gauze sponges, not tissue or cotton balls. Peri-Wound Care Topical Primary Dressing Maxorb Extra Calcium Alginate Dressing, 4x4 in Discharge Instruction: Apply calcium alginate to wound bed as instructed MediHoney Gel, tube 1.5 (oz) Discharge Instruction: Apply to wound bed as instructed Secondary Dressing ABD Pad, 8x10 Discharge Instruction: Apply over primary dressing as directed. CarboFLEX Odor Control Dressing, 4x4 in Discharge Instruction: Apply over primary dressing as directed. Woven Gauze Sponge, Non-Sterile 4x4 in Discharge Instruction: Apply over primary dressing as directed. Secured With Compression Wrap ThreePress (3 layer compression wrap) Discharge Instruction: Apply three layer compression as directed. Compression Stockings Add-Ons Electronic Signature(s) Signed: 08/29/2021 5:06:20 PM By: Deon Pilling RN, BSN Entered By: Deon Pilling on 08/29/2021 14:25:13 -------------------------------------------------------------------------------- Wound Assessment Details Patient Name: Date of Service: [name], [place]. 08/29/2021 1:30 PM Medical Record Number: [id number] Patient Account Number: [id number] Date of Birth/Sex: Treating RN: September 07, 1925 (88 y.o. [name], [name] Primary Care Valentin Benney: [name] Other Clinician: Referring Cailan Antonucci: Treating Esraa Seres/Extender: [name] in Treatment: 76 Wound Status Wound Number: 7RR Primary Etiology: Malignant Wound Wound Location: Left, Circumferential Lower Leg Wound Status: Open Wounding Event:  Blister Date Acquired: 04/20/2020 Weeks Of Treatment: 63 Clustered Wound: Yes Wound Measurements Length: (cm) 2 Width: (cm) 9 Depth: (cm) 0.1 Area: (cm) 14.137 Volume: (cm) 1.414 % Reduction in Area: -360.3% % Reduction in Volume: -360.6% Wound Description Classification: Full Thickness Without Exposed Support Structu Exudate Amount: Medium Exudate Type: Serosanguineous Exudate Color: red, brown res Treatment Notes Wound #7RR (Lower Leg) Wound Laterality: Left, Circumferential Cleanser Soap and Water Discharge Instruction: May shower and wash wound with dial antibacterial soap and water prior to dressing change. Wound Cleanser Discharge Instruction: Cleanse the wound with wound cleanser prior to applying a clean dressing using gauze sponges, not tissue or cotton balls. Peri-Wound Care Topical Primary Dressing Maxorb Extra Calcium Alginate Dressing, 4x4 in Discharge Instruction: Apply calcium alginate to wound bed as instructed MediHoney Gel, tube 1.5 (oz) Discharge Instruction: Apply to wound bed as instructed Secondary Dressing ABD Pad, 8x10 Discharge Instruction: Apply over primary dressing as directed. CarboFLEX Odor Control Dressing, 4x4 in Discharge Instruction: Apply over primary dressing as directed. Woven Gauze Sponge, Non-Sterile 4x4 in Discharge Instruction: Apply over primary dressing as directed. Secured With Compression Wrap ThreePress (3 layer compression wrap) Discharge Instruction: Apply three layer compression as directed. Compression Stockings Add-Ons Electronic Signature(s) Signed: 08/29/2021 5:06:20 PM By: Rolin Barry,  [name] RN, BSN Entered By: Deon Pilling on 08/29/2021 14:25:13

## 2021-08-30 ENCOUNTER — Other Ambulatory Visit: Payer: Self-pay | Admitting: Oncology

## 2021-08-30 DIAGNOSIS — M6281 Muscle weakness (generalized): Secondary | ICD-10-CM | POA: Diagnosis not present

## 2021-08-30 DIAGNOSIS — R278 Other lack of coordination: Secondary | ICD-10-CM | POA: Diagnosis not present

## 2021-08-30 DIAGNOSIS — C44721 Squamous cell carcinoma of skin of unspecified lower limb, including hip: Secondary | ICD-10-CM

## 2021-08-30 DIAGNOSIS — R2689 Other abnormalities of gait and mobility: Secondary | ICD-10-CM | POA: Diagnosis not present

## 2021-08-30 DIAGNOSIS — C4490 Unspecified malignant neoplasm of skin, unspecified: Secondary | ICD-10-CM

## 2021-08-30 NOTE — Progress Notes (Signed)
[name] ([id number]) , Visit Report for 08/29/2021 [place] Details Patient Name: Date of Service: [name], [name] 08/29/2021 Medical Record Number: [id number] Patient Account Number: [id number] Date of Birth/Sex: Treating RN: 31-Mar-1925 (88 y.o. [name], [name] Primary Care Provider: [name] Other Clinician: Referring Provider: Treating Provider/Extender: [name] in Treatment: 67 Diagnosis Coding ICD-10 Codes Code Description C44.92 Squamous cell carcinoma of skin, unspecified L97.812 Non-pressure chronic ulcer of other part of right lower leg with fat layer exposed L97.822 Non-pressure chronic ulcer of other part of left lower leg with fat layer exposed I89.0 Lymphedema, not elsewhere classified I87.313 Chronic venous hypertension (idiopathic) with ulcer of bilateral lower extremity Facility Procedures CPT4 Description Modifier Quantity Code [id number] BILATERAL: Application of multi-layer venous compression system; leg (below knee), including ankle and 1 foot. Electronic Signature(s) Signed: 08/29/2021 5:06:20 PM By: Deon Pilling RN, BSN Signed: 08/30/2021 1:41:40 PM By: Kalman Shan DO Entered By: Deon Pilling on 08/29/2021 14:26:02

## 2021-09-01 ENCOUNTER — Other Ambulatory Visit: Payer: Self-pay | Admitting: Oncology

## 2021-09-02 DIAGNOSIS — M6281 Muscle weakness (generalized): Secondary | ICD-10-CM | POA: Diagnosis not present

## 2021-09-02 DIAGNOSIS — R278 Other lack of coordination: Secondary | ICD-10-CM | POA: Diagnosis not present

## 2021-09-02 DIAGNOSIS — R2689 Other abnormalities of gait and mobility: Secondary | ICD-10-CM | POA: Diagnosis not present

## 2021-09-03 DIAGNOSIS — M6281 Muscle weakness (generalized): Secondary | ICD-10-CM | POA: Diagnosis not present

## 2021-09-03 DIAGNOSIS — R2689 Other abnormalities of gait and mobility: Secondary | ICD-10-CM | POA: Diagnosis not present

## 2021-09-03 DIAGNOSIS — R278 Other lack of coordination: Secondary | ICD-10-CM | POA: Diagnosis not present

## 2021-09-04 ENCOUNTER — Other Ambulatory Visit: Payer: Self-pay

## 2021-09-04 DIAGNOSIS — M6281 Muscle weakness (generalized): Secondary | ICD-10-CM | POA: Diagnosis not present

## 2021-09-04 DIAGNOSIS — R2689 Other abnormalities of gait and mobility: Secondary | ICD-10-CM | POA: Diagnosis not present

## 2021-09-04 DIAGNOSIS — R278 Other lack of coordination: Secondary | ICD-10-CM | POA: Diagnosis not present

## 2021-09-05 ENCOUNTER — Encounter (HOSPITAL_BASED_OUTPATIENT_CLINIC_OR_DEPARTMENT_OTHER): Payer: Medicare Other | Admitting: General Surgery

## 2021-09-05 DIAGNOSIS — I890 Lymphedema, not elsewhere classified: Secondary | ICD-10-CM | POA: Diagnosis not present

## 2021-09-05 DIAGNOSIS — C44722 Squamous cell carcinoma of skin of right lower limb, including hip: Secondary | ICD-10-CM | POA: Diagnosis not present

## 2021-09-05 DIAGNOSIS — C44729 Squamous cell carcinoma of skin of left lower limb, including hip: Secondary | ICD-10-CM | POA: Diagnosis not present

## 2021-09-05 DIAGNOSIS — L97812 Non-pressure chronic ulcer of other part of right lower leg with fat layer exposed: Secondary | ICD-10-CM | POA: Diagnosis not present

## 2021-09-05 DIAGNOSIS — L97822 Non-pressure chronic ulcer of other part of left lower leg with fat layer exposed: Secondary | ICD-10-CM | POA: Diagnosis not present

## 2021-09-05 DIAGNOSIS — I87313 Chronic venous hypertension (idiopathic) with ulcer of bilateral lower extremity: Secondary | ICD-10-CM | POA: Diagnosis not present

## 2021-09-05 NOTE — Progress Notes (Signed)
[name] ([id number]) , Visit Report for 09/05/2021 Arrival Information Details Patient Name: Date of Service: [name], [place]. 09/05/2021 1:30 PM Medical Record Number: [id number] Patient Account Number: [id number] Date of Birth/Sex: Treating RN: 02-Apr-1925 (88 y.o. [name], [name] Primary Care Juanelle Trueheart: [name] Other Clinician: Referring Rayetta Veith: Treating Amya Hlad/Extender: Kelby Aline in Treatment: 52 Visit Information History Since Last Visit Added or deleted any medications: No Patient Arrived: Wheel Chair Any new allergies or adverse reactions: No Arrival Time: 13:03 Had a fall or experienced change in No Accompanied By: self activities of daily living that may affect Transfer Assistance: None risk of falls: Patient Identification Verified: Yes Signs or symptoms of abuse/neglect since last visito No Secondary Verification Process Completed: Yes Hospitalized since last visit: No Patient Requires Transmission-Based No Implantable device outside of the clinic excluding No Precautions: cellular tissue based products placed in the center Patient Has Alerts: Yes since last visit: Patient Alerts: R ABI = Non compressible Has Dressing in Place as Prescribed: Yes L ABI = Non Has Compression in Place as Prescribed: Yes compressible Pain Present Now: No Electronic Signature(s) Signed: 09/05/2021 3:43:19 PM By: Deon Pilling RN, BSN Entered By: Deon Pilling on 09/05/2021 13:39:45 -------------------------------------------------------------------------------- Compression Therapy Details Patient Name: Date of Service: [name], [name] 09/05/2021 1:30 PM Medical Record Number: [id number] Patient Account Number: [id number] Date of Birth/Sex: Treating RN: 07-Nov-1925 (88 y.o. [name] Primary Care Mya Suell: [name] Other Clinician: Referring Quentez Lober: Treating Agnes Brightbill/Extender: Kelby Aline in Treatment: 109 Compression Therapy Performed for Wound Assessment: Wound #7RR Left,Circumferential Lower Leg Performed By: Clinician Deon Pilling, RN Compression Type: Three Hydrologist) Signed: 09/05/2021 3:43:19 PM By: Deon Pilling RN, BSN Entered By: Deon Pilling on 09/05/2021 13:40:12 -------------------------------------------------------------------------------- Compression Therapy Details Patient Name: Date of Service: [name], [name]. 09/05/2021 1:30 PM Medical Record Number: [id number] Patient Account Number: [id number] Date of Birth/Sex: Treating RN: July 28, 1925 (88 y.o. [name] Primary Care Jaxsin Bottomley: [name] Other Clinician: Referring Nattie Lazenby: Treating Joane Postel/Extender: Kelby Aline in Treatment: 20 Compression Therapy Performed for Wound Assessment: Wound #20 Right,Circumferential Lower Leg Performed By: Clinician Deon Pilling, RN Compression Type: Three Hydrologist) Signed: 09/05/2021 3:43:19 PM By: Deon Pilling RN, BSN Entered By: Deon Pilling on 09/05/2021 13:40:12 -------------------------------------------------------------------------------- Encounter Discharge Information Details Patient Name: Date of Service: [name], [name]. 09/05/2021 1:30 PM Medical Record Number: [id number] Patient Account Number: [id number] Date of Birth/Sex: Treating RN: 06-20-1925 (88 y.o. [name] Primary Care Abel Ra: [name] Other Clinician: Referring Rylan Bernard: Treating Trung Wenzl/Extender: Kelby Aline in Treatment: 15 Encounter Discharge Information Items Discharge Condition: Stable Ambulatory Status: Wheelchair Discharge Destination: Home Transportation: Private Auto Accompanied By: self Schedule Follow-up Appointment: Yes Clinical Summary of Care: Electronic Signature(s) Signed: 09/05/2021 3:43:19 PM By: Deon Pilling RN, BSN Entered By: Deon Pilling on 09/05/2021 13:40:33 -------------------------------------------------------------------------------- Wound Assessment Details Patient Name: Date of Service: [name], [name]. 09/05/2021 1:30 PM Medical Record Number: [id number] Patient Account Number: [id number] Date of Birth/Sex: Treating RN: 1925/04/26 (88 y.o. [name] Primary Care Carlen Fils: [name] Other Clinician: Referring Aliceson Dolbow: Treating Kadie Balestrieri/Extender: Kelby Aline in Treatment: 35 Wound Status Wound Number: 20 Primary Etiology: Lymphedema Wound Location: Right, Circumferential Lower Leg Wound Status: Open Wounding Event: Gradually Appeared Date Acquired: 06/25/2021 Weeks Of Treatment: 10 Clustered Wound: Yes Wound Measurements Length: (cm) 28 Width: (  cm) 14 Depth: (cm) 0.1 Area: (cm) 307.876 Volume: (cm) 30.788 % Reduction in Area: 22.5% % Reduction in Volume: 22.5% Wound Description Classification: Full Thickness Without Exposed Support Structu Exudate Amount: Medium Exudate Type: Serosanguineous Exudate Color: red, brown res Treatment Notes Wound #20 (Lower Leg) Wound Laterality: Right, Circumferential Cleanser Soap and Water Discharge Instruction: May shower and wash wound with dial antibacterial soap and water prior to dressing change. Wound Cleanser Discharge Instruction: Cleanse the wound with wound cleanser prior to applying a clean dressing using gauze sponges, not tissue or cotton balls. Peri-Wound Care Topical Primary Dressing Maxorb Extra Calcium Alginate Dressing, 4x4 in Discharge Instruction: Apply calcium alginate to wound bed as instructed MediHoney Gel, tube 1.5 (oz) Discharge Instruction: Apply to wound bed as instructed Secondary Dressing ABD Pad, 8x10 Discharge Instruction: Apply over primary dressing as directed. CarboFLEX Odor Control Dressing, 4x4 in Discharge Instruction: Apply  over primary dressing as directed. Woven Gauze Sponge, Non-Sterile 4x4 in Discharge Instruction: Apply over primary dressing as directed. Secured With Compression Wrap ThreePress (3 layer compression wrap) Discharge Instruction: Apply three layer compression as directed. Compression Stockings Add-Ons Electronic Signature(s) Signed: 09/05/2021 3:43:19 PM By: Deon Pilling RN, BSN Entered By: Deon Pilling on 09/05/2021 13:39:58 -------------------------------------------------------------------------------- Wound Assessment Details Patient Name: Date of Service: [name], [place]. 09/05/2021 1:30 PM Medical Record Number: [id number] Patient Account Number: [id number] Date of Birth/Sex: Treating RN: 01-11-1926 (88 y.o. [name], [name] Primary Care Ellinore Merced: [name] Other Clinician: Referring Inocencio Roy: Treating Alanya Vukelich/Extender: Kelby Aline in Treatment: 51 Wound Status Wound Number: 7RR Primary Etiology: Malignant Wound Wound Location: Left, Circumferential Lower Leg Wound Status: Open Wounding Event: Blister Date Acquired: 04/20/2020 Weeks Of Treatment: 64 Clustered Wound: Yes Wound Measurements Length: (cm) 2 Width: (cm) 9 Depth: (cm) 0.1 Area: (cm) 14.137 Volume: (cm) 1.414 % Reduction in Area: -360.3% % Reduction in Volume: -360.6% Wound Description Classification: Full Thickness Without Exposed Support Structu Exudate Amount: Medium Exudate Type: Serosanguineous Exudate Color: red, brown res Treatment Notes Wound #7RR (Lower Leg) Wound Laterality: Left, Circumferential Cleanser Soap and Water Discharge Instruction: May shower and wash wound with dial antibacterial soap and water prior to dressing change. Wound Cleanser Discharge Instruction: Cleanse the wound with wound cleanser prior to applying a clean dressing using gauze sponges, not tissue or cotton balls. Peri-Wound Care Topical Primary Dressing Maxorb  Extra Calcium Alginate Dressing, 4x4 in Discharge Instruction: Apply calcium alginate to wound bed as instructed MediHoney Gel, tube 1.5 (oz) Discharge Instruction: Apply to wound bed as instructed Secondary Dressing ABD Pad, 8x10 Discharge Instruction: Apply over primary dressing as directed. CarboFLEX Odor Control Dressing, 4x4 in Discharge Instruction: Apply over primary dressing as directed. Woven Gauze Sponge, Non-Sterile 4x4 in Discharge Instruction: Apply over primary dressing as directed. Secured With Compression Wrap ThreePress (3 layer compression wrap) Discharge Instruction: Apply three layer compression as directed. Compression Stockings Add-Ons Electronic Signature(s) Signed: 09/05/2021 3:43:19 PM By: Deon Pilling RN, BSN Entered By: Deon Pilling on 09/05/2021 13:39:58

## 2021-09-05 NOTE — Progress Notes (Signed)
[name] ([id number]) , Visit Report for 09/05/2021 SuperBill Details Patient Name: Date of Service: [name], [place]. 09/05/2021 Medical Record Number: [id number] Patient Account Number: [id number] Date of Birth/Sex: Treating RN: 04/29/1925 (88 y.o. [name], [name] Primary Care Provider: [name] Other Clinician: Referring Provider: Treating Provider/Extender: Kelby Aline in Treatment: 33 Diagnosis Coding ICD-10 Codes Code Description C44.92 Squamous cell carcinoma of skin, unspecified L97.812 Non-pressure chronic ulcer of other part of right lower leg with fat layer exposed L97.822 Non-pressure chronic ulcer of other part of left lower leg with fat layer exposed I89.0 Lymphedema, not elsewhere classified I87.313 Chronic venous hypertension (idiopathic) with ulcer of bilateral lower extremity Facility Procedures CPT4 Description Modifier Quantity Code [id number] BILATERAL: Application of multi-layer venous compression system; leg (below knee), including ankle and 1 foot. Electronic Signature(s) Signed: 09/05/2021 3:43:19 PM By: Deon Pilling RN, BSN Signed: 09/05/2021 4:46:01 PM By: Fredirick Maudlin MD FACS Entered By: Deon Pilling on 09/05/2021 13:40:39

## 2021-09-09 ENCOUNTER — Other Ambulatory Visit: Payer: Self-pay | Admitting: Internal Medicine

## 2021-09-09 ENCOUNTER — Telehealth: Payer: Self-pay | Admitting: Internal Medicine

## 2021-09-09 NOTE — Telephone Encounter (Signed)
Rec'd fax from pharmacy will need new rx for Buspirone 15 mg since pt is taking three times a day.Marland KitchenJohny Chess

## 2021-09-09 NOTE — Telephone Encounter (Signed)
Caylee wanted to advise that medications were changed when pt was discharged from skilled nursing. The nursing home changed her busPIRone (BUSPAR) 15 MG tablet dosage. She was taking it twice a day. The nursing home changed it to 3 times a day. They have also prescribed her Zoloft '25mg'$  to take every morning.    Fyi

## 2021-09-10 ENCOUNTER — Encounter (HOSPITAL_BASED_OUTPATIENT_CLINIC_OR_DEPARTMENT_OTHER): Payer: Medicare Other | Admitting: Internal Medicine

## 2021-09-10 ENCOUNTER — Other Ambulatory Visit: Payer: Self-pay | Admitting: Internal Medicine

## 2021-09-10 DIAGNOSIS — C44729 Squamous cell carcinoma of skin of left lower limb, including hip: Secondary | ICD-10-CM | POA: Diagnosis not present

## 2021-09-10 DIAGNOSIS — L97812 Non-pressure chronic ulcer of other part of right lower leg with fat layer exposed: Secondary | ICD-10-CM

## 2021-09-10 DIAGNOSIS — I87313 Chronic venous hypertension (idiopathic) with ulcer of bilateral lower extremity: Secondary | ICD-10-CM

## 2021-09-10 DIAGNOSIS — C4492 Squamous cell carcinoma of skin, unspecified: Secondary | ICD-10-CM | POA: Diagnosis not present

## 2021-09-10 DIAGNOSIS — C44722 Squamous cell carcinoma of skin of right lower limb, including hip: Secondary | ICD-10-CM | POA: Diagnosis not present

## 2021-09-10 DIAGNOSIS — I890 Lymphedema, not elsewhere classified: Secondary | ICD-10-CM | POA: Diagnosis not present

## 2021-09-10 DIAGNOSIS — L97822 Non-pressure chronic ulcer of other part of left lower leg with fat layer exposed: Secondary | ICD-10-CM | POA: Diagnosis not present

## 2021-09-10 NOTE — Progress Notes (Signed)
[name] ([id number]) , Visit Report for 09/10/2021 Arrival Information Details Patient Name: Date of Service: [name], [place]. 09/10/2021 1:30 PM Medical Record Number: [id number] Patient Account Number: [id number] Date of Birth/Sex: Treating RN: 09-20-1925 (88 y.o. [name], [name] Primary Care Honey Zakarian: [name] Other Clinician: Referring Mayzie Caughlin: Treating Kateland Leisinger/Extender: [name] in Treatment: 56 Visit Information History Since Last Visit Added or deleted any medications: No Patient Arrived: Wheel Chair Any new allergies or adverse reactions: No Arrival Time: 13:30 Had a fall or experienced change in No Accompanied By: daughter activities of daily living that may affect Transfer Assistance: None risk of falls: Patient Identification Verified: Yes Signs or symptoms of abuse/neglect since last visito No Secondary Verification Process Completed: Yes Hospitalized since last visit: No Patient Requires Transmission-Based No Implantable device outside of the clinic excluding No Precautions: cellular tissue based products placed in the center Patient Has Alerts: Yes since last visit: Patient Alerts: R ABI = Non compressible Has Dressing in Place as Prescribed: Yes L ABI = Non compressible Has Compression in Place as Prescribed: Yes Pain Present Now: No Electronic Signature(s) Signed: 09/10/2021 3:55:04 PM By: Deon Pilling RN, BSN Entered By: Deon Pilling on 09/10/2021 13:32:14 -------------------------------------------------------------------------------- Compression Therapy Details Patient Name: Date of Service: [name], [name] 09/10/2021 1:30 PM Medical Record Number: [id number] Patient Account Number: [id number] Date of Birth/Sex: Treating RN: 1925-04-08 (88 y.o. [name] Primary Care Kerina Simoneau: [name] Other Clinician: Referring Barrie Sigmund: Treating Tevion Laforge/Extender: [name] in Treatment: 22 Compression Therapy Performed for Wound Assessment: Wound #20 Right,Circumferential Lower Leg Performed By: Clinician Deon Pilling, RN Compression Type: Three Layer Post Procedure Diagnosis Same as Pre-procedure Electronic Signature(s) Signed: 09/10/2021 3:55:04 PM By: Deon Pilling RN, BSN Entered By: Deon Pilling on 09/10/2021 14:14:05 -------------------------------------------------------------------------------- Compression Therapy Details Patient Name: Date of Service: [name], [name]. 09/10/2021 1:30 PM Medical Record Number: [id number] Patient Account Number: [id number] Date of Birth/Sex: Treating RN: [birth year] (88 y.o. [name] Primary Care Tenzin Edelman: [name] Other Clinician: Referring Christopherjohn Schiele: Treating Chantella Creech/Extender: [name] in Treatment: 70 Compression Therapy Performed for Wound Assessment: Wound #7RR Left,Circumferential Lower Leg Performed By: Clinician Deon Pilling, RN Compression Type: Three Layer Post Procedure Diagnosis Same as Pre-procedure Electronic Signature(s) Signed: 09/10/2021 3:55:04 PM By: Deon Pilling RN, BSN Entered By: Deon Pilling on 09/10/2021 14:14:05 -------------------------------------------------------------------------------- Encounter Discharge Information Details Patient Name: Date of Service: [name], [name]. 09/10/2021 1:30 PM Medical Record Number: [id number] Patient Account Number: [id number] Date of Birth/Sex: Treating RN: [birth year] (88 y.o. [name] Primary Care Oluwatamilore Starnes: [name] Other Clinician: Referring Francoise Chojnowski: Treating Shernita Rabinovich/Extender: [name] in Treatment: 29 Encounter Discharge Information Items Discharge Condition: Stable Ambulatory Status: Wheelchair Discharge Destination: Home Transportation: Private Auto Accompanied By:  daughter Schedule Follow-up Appointment: Yes Clinical Summary of Care: Electronic Signature(s) Signed: 09/10/2021 3:55:04 PM By: Deon Pilling RN, BSN Entered By: Deon Pilling on 09/10/2021 14:21:24 -------------------------------------------------------------------------------- Lower Extremity Assessment Details Patient Name: Date of Service: [name], [name]. 09/10/2021 1:30 PM Medical Record Number: [id number] Patient Account Number: [id number] Date of Birth/Sex: Treating RN: 1925/07/16 (88 y.o. [name] Primary Care Betzayda Braxton: [name] Other Clinician: Referring Temiloluwa Laredo: Treating Erice Ahles/Extender: [name] in Treatment: 67 Edema Assessment Assessed: [Left: Yes] [Right: Yes] Edema: [Left: No] [Right: No] Calf Left: Right: Point of Measurement: 30 cm From Medial Instep 29  cm 30 cm Ankle Left: Right: Point of Measurement: 9 cm From Medial Instep 22 cm 21 cm Vascular Assessment Pulses: Dorsalis Pedis Palpable: [Left:Yes] [Right:Yes] Electronic Signature(s) Signed: 09/10/2021 3:55:04 PM By: Deon Pilling RN, BSN Entered By: Deon Pilling on 09/10/2021 13:37:45 -------------------------------------------------------------------------------- Multi Wound Chart Details Patient Name: Date of Service: [name], [name]. 09/10/2021 1:30 PM Medical Record Number: [id number] Patient Account Number: [id number] Date of Birth/Sex: Treating RN: September 22, 1925 (88 y.o. [name], [name] Primary Care Winfred Redel: [name] Other Clinician: Referring Kashden Deboy: Treating Lumina Gitto/Extender: [name] in Treatment: 14 Vital Signs Height(in): 65 Pulse(bpm): 60 Weight(lbs): 163 Blood Pressure(mmHg): 104/67 Body Mass Index(BMI): 27.1 Temperature(F): 98.8 Respiratory Rate(breaths/min): 20 Photos: [N/A:N/A] Right, Circumferential Lower Leg Left, Circumferential Lower Leg N/A Wound  Location: Gradually Appeared Blister N/A Wounding Event: Lymphedema Malignant Wound N/A Primary Etiology: Anemia, Arrhythmia, Congestive Heart Anemia, Arrhythmia, Congestive Heart N/A Comorbid History: Failure, Hypertension, Colitis, Failure, Hypertension, Colitis, Osteoarthritis Osteoarthritis 06/25/2021 04/20/2020 N/A Date Acquired: 10 65 N/A Weeks of Treatment: Open Open N/A Wound Status: No Yes N/A Wound Recurrence: Yes Yes N/A Clustered Wound: 1 1 N/A Clustered Quantity: 1.6x0.7x0.1 1.6x0.7x0.1 N/A Measurements L x W x D (cm) 0.88 0.88 N/A A (cm) : rea 0.088 0.088 N/A Volume (cm) : 99.80% 71.30% N/A % Reduction in Area: 99.80% 71.30% N/A % Reduction in Volume: Full Thickness Without Exposed Full Thickness Without Exposed N/A Classification: Support Structures Support Structures Medium Medium N/A Exudate Amount: Serosanguineous Serosanguineous N/A Exudate Type: red, brown red, brown N/A Exudate Color: Distinct, outline attached Distinct, outline attached N/A Wound Margin: Large (67-100%) Large (67-100%) N/A Granulation Amount: Red Red, Pink N/A Granulation Quality: None Present (0%) None Present (0%) N/A Necrotic Amount: Fat Layer (Subcutaneous Tissue): Yes Fat Layer (Subcutaneous Tissue): Yes N/A Exposed Structures: Fascia: No Fascia: No Tendon: No Tendon: No Muscle: No Muscle: No Joint: No Joint: No Bone: No Bone: No Small (1-33%) Large (67-100%) N/A Epithelialization: Compression Therapy Compression Therapy N/A Procedures Performed: Treatment Notes Wound #20 (Lower Leg) Wound Laterality: Right, Circumferential Cleanser Soap and Water Discharge Instruction: May shower and wash wound with dial antibacterial soap and water prior to dressing change. Wound Cleanser Discharge Instruction: Cleanse the wound with wound cleanser prior to applying a clean dressing using gauze sponges, not tissue or cotton balls. Peri-Wound Care cetaphil  lotion Discharge Instruction: patient to provide to nurses to apply to both legs. Topical Primary Dressing Maxorb Extra Calcium Alginate Dressing, 4x4 in Discharge Instruction: Apply calcium alginate to wound bed as instructed MediHoney Gel, tube 1.5 (oz) Discharge Instruction: Apply to wound bed as instructed Secondary Dressing ABD Pad, 8x10 Discharge Instruction: Apply over primary dressing as directed. Secured With Compression Wrap ThreePress (3 layer compression wrap) Discharge Instruction: Apply three layer compression as directed. ***PLEASE USE KERLIX INSTEAD OF COTTON.*** Compression Stockings Add-Ons Wound #7RR (Lower Leg) Wound Laterality: Left, Circumferential Cleanser Soap and Water Discharge Instruction: May shower and wash wound with dial antibacterial soap and water prior to dressing change. Wound Cleanser Discharge Instruction: Cleanse the wound with wound cleanser prior to applying a clean dressing using gauze sponges, not tissue or cotton balls. Peri-Wound Care cetaphil lotion Discharge Instruction: patient to provide to nurses to apply to both legs. Topical Primary Dressing Maxorb Extra Calcium Alginate Dressing, 4x4 in Discharge Instruction: Apply calcium alginate to wound bed as instructed MediHoney Gel, tube 1.5 (oz) Discharge Instruction: Apply to wound bed as instructed Secondary Dressing ABD Pad, 8x10 Discharge Instruction: Apply over primary dressing as  directed. Secured With Compression Wrap ThreePress (3 layer compression wrap) Discharge Instruction: Apply three layer compression as directed. ***PLEASE USE KERLIX INSTEAD OF COTTON.*** Compression Stockings Add-Ons Electronic Signature(s) Signed: 09/10/2021 3:51:23 PM By: Kalman Shan DO Signed: 09/10/2021 3:55:04 PM By: Deon Pilling RN, BSN Entered By: Kalman Shan on 09/10/2021 15:07:24 -------------------------------------------------------------------------------- Multi-Disciplinary  Care Plan Details Patient Name: Date of Service: [name], [place]. 09/10/2021 1:30 PM Medical Record Number: [id number] Patient Account Number: [id number] Date of Birth/Sex: Treating RN: 07-Jan-1926 (88 y.o. [name] Primary Care Neema Barreira: [name] Other Clinician: Referring Yordi Krager: Treating Cheyene Hamric/Extender: [name] in Treatment: 6 Multidisciplinary Care Plan reviewed with physician Active Inactive Venous Leg Ulcer Nursing Diagnoses: Knowledge deficit related to disease process and management Potential for venous Insuffiency (use before diagnosis confirmed) Goals: Patient will maintain optimal edema control Date Initiated: 11/01/2020 Target Resolution Date: 09/20/2021 Goal Status: Active Interventions: Assess peripheral edema status every visit. Compression as ordered Provide education on venous insufficiency Treatment Activities: Therapeutic compression applied : 11/01/2020 Notes: 12/27/20: Edema control ongoing 01/24/21: Edema control continues, using 3 layer compression Wound/Skin Impairment Nursing Diagnoses: Impaired tissue integrity Knowledge deficit related to ulceration/compromised skin integrity Goals: Patient/caregiver will verbalize understanding of skin care regimen Date Initiated: 06/11/2020 Target Resolution Date: 09/21/2021 Goal Status: Active Interventions: Assess patient/caregiver ability to obtain necessary supplies Assess patient/caregiver ability to perform ulcer/skin care regimen upon admission and as needed Assess ulceration(s) every visit Provide education on ulcer and skin care Notes: 10/11/20: Wound care regimen ongoing. 12/27/20: Wound care continues Electronic Signature(s) Signed: 09/10/2021 3:55:04 PM By: Deon Pilling RN, BSN Entered By: Deon Pilling on 09/10/2021 13:43:56 -------------------------------------------------------------------------------- Pain Assessment Details Patient  Name: Date of Service: [name], [place]. 09/10/2021 1:30 PM Medical Record Number: [id number] Patient Account Number: [id number] Date of Birth/Sex: Treating RN: 04/15/1925 (88 y.o. [name] Primary Care Marinda Tyer: [name] Other Clinician: Referring Annalis Kaczmarczyk: Treating Saumya Hukill/Extender: [name] in Treatment: 65 Active Problems Location of Pain Severity and Description of Pain Patient Has Paino No Site Locations Rate the pain. Current Pain Level: 0 Pain Management and Medication Current Pain Management: Medication: No Cold Application: No Rest: No Massage: No Activity: No T.E.N.S.: No Heat Application: No Leg drop or elevation: No Is the Current Pain Management Adequate: Adequate How does your wound impact your activities of daily livingo Sleep: No Bathing: No Appetite: No Relationship With Others: No Bladder Continence: No Emotions: No Bowel Continence: No Work: No Toileting: No Drive: No Dressing: No Hobbies: No Engineer, maintenance) Signed: 09/10/2021 3:55:04 PM By: Deon Pilling RN, BSN Entered By: Deon Pilling on 09/10/2021 13:32:37 -------------------------------------------------------------------------------- Patient/Caregiver Education Details Patient Name: Date of Service: [name], [place] A. 8/22/2023andnbsp1:30 PM Medical Record Number: [id number] Patient Account Number: [id number] Date of Birth/Gender: Treating RN: 12/10/1925 (88 y.o. [name] Primary Care Physician: [name] Other Clinician: Referring Physician: Treating Physician/Extender: [name] in Treatment: 53 Education Assessment Education Provided To: Patient and Caregiver daughter Education Topics Provided Wound/Skin Impairment: Handouts: Skin Care Do's and Dont's Methods: Explain/Verbal Responses: Reinforcements needed Electronic Signature(s) Signed: 09/10/2021 3:55:04 PM  By: Deon Pilling RN, BSN Entered By: Deon Pilling on 09/10/2021 13:44:13 -------------------------------------------------------------------------------- Wound Assessment Details Patient Name: Date of Service: [name], [place]. 09/10/2021 1:30 PM Medical Record Number: [id number] Patient Account Number: [id number] Date of Birth/Sex: Treating RN: 08-19-1925 (88 y.o. [name] Primary Care Kasie Leccese: [name] Other Clinician: Referring Berda Shelvin:  Treating Cyrena Kuchenbecker/Extender: Ludger Nutting Weeks in Treatment: 105 Wound Status Wound Number: 20 Primary Lymphedema Etiology: Wound Location: Right, Circumferential Lower Leg Wound Open Wounding Event: Gradually Appeared Status: Date Acquired: 06/25/2021 Comorbid Anemia, Arrhythmia, Congestive Heart Failure, Hypertension, Weeks Of Treatment: 10 History: Colitis, Osteoarthritis Clustered Wound: Yes Photos Wound Measurements Length: (cm) 1.6 Width: (cm) 0.7 Depth: (cm) 0.1 Clustered Quantity: 1 Area: (cm) 0.88 Volume: (cm) 0.088 % Reduction in Area: 99.8% % Reduction in Volume: 99.8% Epithelialization: Small (1-33%) Tunneling: No Undermining: No Wound Description Classification: Full Thickness Without Exposed Support Structures Wound Margin: Distinct, outline attached Exudate Amount: Medium Exudate Type: Serosanguineous Exudate Color: red, brown Foul Odor After Cleansing: No Slough/Fibrino No Wound Bed Granulation Amount: Large (67-100%) Exposed Structure Granulation Quality: Red Fascia Exposed: No Necrotic Amount: None Present (0%) Fat Layer (Subcutaneous Tissue) Exposed: Yes Tendon Exposed: No Muscle Exposed: No Joint Exposed: No Bone Exposed: No Treatment Notes Wound #20 (Lower Leg) Wound Laterality: Right, Circumferential Cleanser Soap and Water Discharge Instruction: May shower and wash wound with dial antibacterial soap and water prior to dressing change. Wound  Cleanser Discharge Instruction: Cleanse the wound with wound cleanser prior to applying a clean dressing using gauze sponges, not tissue or cotton balls. Peri-Wound Care cetaphil lotion Discharge Instruction: patient to provide to nurses to apply to both legs. Topical Primary Dressing Maxorb Extra Calcium Alginate Dressing, 4x4 in Discharge Instruction: Apply calcium alginate to wound bed as instructed MediHoney Gel, tube 1.5 (oz) Discharge Instruction: Apply to wound bed as instructed Secondary Dressing ABD Pad, 8x10 Discharge Instruction: Apply over primary dressing as directed. Secured With Compression Wrap ThreePress (3 layer compression wrap) Discharge Instruction: Apply three layer compression as directed. ***PLEASE USE KERLIX INSTEAD OF COTTON.*** Compression Stockings Add-Ons Electronic Signature(s) Signed: 09/10/2021 3:55:04 PM By: Deon Pilling RN, BSN Signed: 09/10/2021 4:08:49 PM By: Erenest Blank Entered By: Erenest Blank on 09/10/2021 13:48:08 -------------------------------------------------------------------------------- Wound Assessment Details Patient Name: Date of Service: [name], [place]. 09/10/2021 1:30 PM Medical Record Number: [id number] Patient Account Number: [id number] Date of Birth/Sex: Treating RN: 10-26-1925 (88 y.o. [name], [name] Primary Care Amardeep Beckers: [name] Other Clinician: Referring Derryl Uher: Treating Nathanie Ottley/Extender: [name] in Treatment: 64 Wound Status Wound Number: 7RR Primary Malignant Wound Etiology: Wound Location: Left, Circumferential Lower Leg Wound Open Wounding Event: Blister Status: Date Acquired: 04/20/2020 Comorbid Anemia, Arrhythmia, Congestive Heart Failure, Hypertension, Weeks Of Treatment: 65 History: Colitis, Osteoarthritis Clustered Wound: Yes Photos Wound Measurements Length: (cm) 1.6 Width: (cm) 0.7 Depth: (cm) 0.1 Clustered Quantity: 1 Area: (cm)  0.88 Volume: (cm) 0.088 % Reduction in Area: 71.3% % Reduction in Volume: 71.3% Epithelialization: Large (67-100%) Tunneling: No Undermining: No Wound Description Classification: Full Thickness Without Exposed Support Structures Wound Margin: Distinct, outline attached Exudate Amount: Medium Exudate Type: Serosanguineous Exudate Color: red, brown Foul Odor After Cleansing: No Slough/Fibrino No Wound Bed Granulation Amount: Large (67-100%) Exposed Structure Granulation Quality: Red, Pink Fascia Exposed: No Necrotic Amount: None Present (0%) Fat Layer (Subcutaneous Tissue) Exposed: Yes Tendon Exposed: No Muscle Exposed: No Joint Exposed: No Bone Exposed: No Treatment Notes Wound #7RR (Lower Leg) Wound Laterality: Left, Circumferential Cleanser Soap and Water Discharge Instruction: May shower and wash wound with dial antibacterial soap and water prior to dressing change. Wound Cleanser Discharge Instruction: Cleanse the wound with wound cleanser prior to applying a clean dressing using gauze sponges, not tissue or cotton balls. Peri-Wound Care cetaphil lotion Discharge Instruction: patient to provide to nurses to apply  to both legs. Topical Primary Dressing Maxorb Extra Calcium Alginate Dressing, 4x4 in Discharge Instruction: Apply calcium alginate to wound bed as instructed MediHoney Gel, tube 1.5 (oz) Discharge Instruction: Apply to wound bed as instructed Secondary Dressing ABD Pad, 8x10 Discharge Instruction: Apply over primary dressing as directed. Secured With Compression Wrap ThreePress (3 layer compression wrap) Discharge Instruction: Apply three layer compression as directed. ***PLEASE USE KERLIX INSTEAD OF COTTON.*** Compression Stockings Add-Ons Electronic Signature(s) Signed: 09/10/2021 3:55:04 PM By: Deon Pilling RN, BSN Signed: 09/10/2021 4:08:49 PM By: Erenest Blank Entered By: Erenest Blank on 09/10/2021  13:48:36 -------------------------------------------------------------------------------- Vitals Details Patient Name: Date of Service: [name], [place]. 09/10/2021 1:30 PM Medical Record Number: [id number] Patient Account Number: [id number] Date of Birth/Sex: Treating RN: 06-12-1925 (88 y.o. [name], [name] Primary Care Aeron Lheureux: [name] Other Clinician: Referring Guerin Lashomb: Treating Veverly Larimer/Extender: [name] in Treatment: 5 Vital Signs Time Taken: 13:30 Temperature (F): 98.8 Height (in): 65 Pulse (bpm): 60 Weight (lbs): 163 Respiratory Rate (breaths/min): 20 Body Mass Index (BMI): 27.1 Blood Pressure (mmHg): 104/67 Reference Range: 80 - 120 mg / dl Electronic Signature(s) Signed: 09/10/2021 3:55:04 PM By: Deon Pilling RN, BSN Entered By: Deon Pilling on 09/10/2021 13:32:28

## 2021-09-10 NOTE — Progress Notes (Signed)
[name] ([id number]) , Visit Report for 09/10/2021 Chief Complaint Document Details Patient Name: Date of Service: [name], [place]. 09/10/2021 1:30 PM Medical Record Number: [id number] Patient Account Number: [id number] Date of Birth/Sex: Treating RN: [birth year] (88 y.o. [name] Primary Care Provider: [name] Other Clinician: Referring Provider: Treating Provider/Extender: [name] in Treatment: 29 Information Obtained from: Patient Chief Complaint Bilateral lower extremity wounds that have been biopsied and positive for squamous cell carcinoma Bilateral lower extremity wounds due to chronic venous insufficiency/lymphedema Electronic Signature(s) Signed: 09/10/2021 3:51:23 PM By: Kalman Shan DO Entered By: Kalman Shan on 09/10/2021 15:07:53 -------------------------------------------------------------------------------- HPI Details Patient Name: Date of Service: [name], [place]. 09/10/2021 1:30 PM Medical Record Number: [id number] Patient Account Number: [id number] Date of Birth/Sex: Treating RN: August 28, 1925 (88 y.o. [name] Primary Care Provider: [name] Other Clinician: Referring Provider: Treating Provider/Extender: [name] in Treatment: 100 History of Present Illness Location: left leg HPI Description: Admission 5/23 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 She states that the wound on the right has been present for a year. The 1 on the left has opened up 1 month ago. She is followed with oncology for this issue as she had biopsies that showed squamous cell carcinoma. She is also seeing radiation oncology for treatment  options. She presents today because she would like for her wounds to be healed by [place]. She currently denies signs of infection. 6/1; patient presents for 1 week follow-up. She states she has tolerated the leg wraps well. She states these do not bother her and is happy to continue with them. She is scheduled to see her oncologist today to go over treatment options for the bilateral lower extremity squamous cell carcinoma. Radiation is currently not a recommended option. Patient states she overall feels well. 6/22; patient presents for 3-week follow-up. She has tolerated the wraps well until her last wrap where she states they were uncomfortable. She attributes this to the home health nurse. She denies signs of infection. She has started her first treatment of antibody infusions for her Bilateral lower extremity squamous cell carcinoma. She has no complaints today. 7/21; patient presents for 1 month follow-up. Unfortunately she has not had good experience with her wrap changes with home health. She would like to do her own dressing changes. She continues to do her antibody infusions. She denies signs of infection. 7/28; patient presents for 1 week follow-up. At last clinic visit she was switched to daily dressing changes due to issues with the wrap and home health placing them. Unfortunately she has developed weeping to her legs bilaterally. She would like to be placed in wraps today. She would also like to follow with [place] weekly for wrap changes instead of having home health change them. She denies signs of infection. 8/4; patient presents for 1 week follow-up. She has tolerated the Kerlix/Coban wraps well. She no longer has weeping to her legs. She took the wrap off 1 day before coming in to be able to take a shower. She has no issues or complaints today. She denies signs of infection. 8/18; patient presents for follow-up. Patient has tolerated the wraps well. She brought her Velcro compression wraps  today. She has no  issues or complaints today. She had her chemotherapy infusion yesterday without issues. She denies signs of infection. 8/25; patient presents for follow-up. She used her juxta lite compressions for the past week. It is unclear if she is able to put these on correctly since she states she has a hard time getting them to look right. She reports 2 open wounds. She currently denies signs of infection. 9/1; patient presents for 1 week follow-up. She has 1 open wound. She tolerated the compression wraps well. She currently denies signs of infection. 9/8; patient presents for 1 week follow-up. She has 2 open wounds 1 on each leg. She has tolerated the compression wrap well. She currently denies signs of infection. 9/15; patient presents for 1 week follow-up. She now has 3 wounds. 2 on the left and 1 on the right. She continues to tolerate the compression wrap well. She currently denies signs of infection. She has obtained furosemide by her primary care physician and would like to discuss when to take this. 9/22; patient presents for 1 week follow-up. She has scattered wounds on her lower extremities bilaterally. She did take furosemide twice in the past week. She does not recall having to urinate more frequently. She tolerated the 3 layer compression well. She denies signs of infection. 10/13; patient has not been here recently because of [place]. Apparently the facility was only putting gauze on her legs. This is a patient I do not normally see. 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 Ronnald Ramp at [facility] dermatology. She is quite convincing that she did not have radiation to her lower legs. It is likely she also has significant chronic venous insufficiency stasis dermatitis. We have been using silver alginate under kerlix Coban. She has obvious open areas medially on the right and areas on the left. She also has  areas of extensive dry flaking adherent areas on the right and to a lesser extent on the left anterior. Nodular areas on the left lateral lower leg left posterior calf and right mid calf medially. 10/21; patient presents for follow-up. She has no issues or complaints today. She has tolerated the 3 layer compression well bilaterally. She denies signs of infection. 10/27; patient presents for follow-up. She continues to tolerate the 3 layer compression wrap well. She denies signs of infection. 11/30; patient presents for follow-up. She has no issues or complaints today. 11/10; patient arrived in clinic today for nurse visit accompanied by her daughter from [place]. The daughter had multiple questions so we turned this into doctors visit. Apparently after the last time I saw this woman in October she went to see Dr. Ronnald Ramp but he did not take the wraps off. She previously was treated with Cemiplimab for her squamous cell carcinoma. She did not receive radiation to her legs. I had wanted Dr. Ronnald Ramp to look at this because of the exceptionally damaged skin on her lower legs bilaterally. She has odd looking wounds on the left medial lower leg also extending posteriorly which we have not made a lot of progress on. But she also has a raised hyperkeratotic nodules on the right anterior tibial area plaques on the left medial lower thigh. These are not areas that are under compression. We have been using silver alginate on any open areas Liberal TCA under 3 layer compression. 11/17; patient presents for follow-up. She had her wraps taken off yesterday for her dermatology appointment. She reports excessive  weeping to her legs bilaterally after the wraps were taken off. She currently denies signs of infection. 12/12/2020 upon evaluation today patient appears to be doing about the same in regard to her wounds. She was actually started on Cipro after having biopsies apparently at her dermatology clinic she does not  have the results back from the actual biopsy but the culture did return and apparently they placed her on the Cipro she started that just this morning. Other than that her legs appear to be doing okay at this time. 12/1; patient presents for follow-up. She has no issues or complaints today. She has started Lasix 40 mg daily to help with her bilateral leg swelling. 12/8; patient presents for follow-up. She has no issues or complaints today. 12/15; patient presents for 1 week follow-up. She has no issues or complaints today. 12/22; patient presents for follow-up. She has no issues or complaints today. 1/5; patient presents for follow-up. She has no issues or complaints today. She has tolerated the compression wraps well. 1/12; patient presents for follow-up. She is tolerated the compression wrap well and has no issues or complaints today. She denies signs of infection. 1/19; patient presents for follow-up. She took the compression wraps off 1 to 2 days ago to take a shower and did not have compression wraps replaced. She reports increased blistering throughout her legs bilaterally. She currently denies signs of infection. 1/26; patient presents for follow-up. She has no issues or complaints today. She tolerated the compression wraps well. She has not heard from oncology to schedule an appointment. 2/2; patient presents for follow-up. She has no issues or complaints today. She continues to tolerate the compression wrap well. 2/16; patient presents for follow-up. She has no issues or complaints today. 2/23; patient presents for follow-up. She has no issues or complaints today. Tomorrow she has an appointment with oncology to look at the suspicious lesion on her right lower extremity. She currently denies signs of infection. 3/2; patient presents for follow-up. She has been using her juxta light compression to the right lower extremity however there has been increased swelling and opening of wounds  throughout the legs with weeping. She currently denies signs of infection. She had no issues with the compression wrap to the left lower extremity. 3/16; patient presents for follow-up. She has no issues or complaints today. She states that she has been restarted on infusions for her squamous cell carcinoma of her legs. She has also been increased on Lasix to 40 mg twice daily. She has noticed a decrease in swelling to her legs. 3/23; patient presents for follow-up. She starts her second infusion next week for her squamous cell carcinoma lesions to her legs. She reports no issues with the compression wraps. 3/30; patient presents for follow-up. She has no issues or complaints today. 4/6; patient presents for follow-up. Her left lower extremity wounds have healed. She has her juxta light compression with her today. She has no issues or complaints today. 4/13; this is a patient we actually discharged to juxta lite stockings on the left leg the last time she was here. She came in on Monday for [place] to look at the leg and a nurse visit. She had massive swelling and numerous blisters and wound reopening. We put her back in 3 layer compression although I did not generate a note. Silver alginate on the wounded areas. We have also been doing the same on the right. In the meantime she is obtained her external compression pumps that were  previously ordered and she started to use them. She has skin cancers that are obvious on the right leg x2 her appointment with Dr. Jarome Matin of dermatology is on May 5.. She is also receiving weekly chemotherapy for recurrent presumably squamous cell carcinomas apparently has had 2 treatments 4/20; patient presents for follow-up. She has her new juxta lite compressions today. She continues to have open wounds to the left lower extremity. She has follow-up with dermatology in 2 weeks. She continues to have IV infusions for her squamous cell carcinoma lesions on her legs. 4/28;  the patient has bilateral lower extremity wounds predominantly on the right lateral upper leg, right anterior lower leg which in itself is probably a recurrent cancer as well as the left lateral lower leg. She has recurrent squamous cell carcinoma currently being treated with an IV infusion. She also has scattered nodules on her upper lower legs and thighs I am not certain of all of this is felt to be malignant as well We have been using silver alginate on the open areas wrapping her legs. She also has her external compression pumps at home but I am not clear that she is going to use these 5/2; patient presents for follow-up. She has not been taking her diuretics as prescribed. She sees Dr. Dian Situ, dermatology at the end of the week. She tolerated the compression wraps well today. She has her juxta lite compressions. 5/8; patient presents for follow-up. She saw Dr. Dian Situ, dermatology and she has nothing to report on. She has been using her juxta lite compression to the right lower extremity. She has tolerated the compression wrap well in the left lower extremity. She has not been taking her diuretic. She is scheduled to continue her infusions for her squamous cell carcinoma on her legs. 5/18; patient presents for follow-up. We wrapped her in 3 layer compression at last clinic visit. She has no issues or complaints today. 5/22; patient presents for follow-up. She has been wrapped in 3 layer compression bilaterally to her lower extremities over the past week. She has been scratching at the top of the wrap and picking at her skin. This area has increased warmth and erythema. 5/30; patient presents for follow-up. She completed Keflex with resolution of her symptoms to the right lower extremity. She no longer has increased warmth or erythema. She continues her infusions for her squamous cell carcinoma lesions. We continue to wrap her in 3 layer compression with silver alginate. She currently denies signs of  infection. 6/8; the patient went to see Dr. Ronnald Ramp of dermatology 2 days ago unfortunately we do not have his notes. They removed her compression wraps of course but did not adequately replace them. She comes in today with 3 wounds on the right lateral upper calf 1 medially below the obvious squamous cell carcinoma there is a large necrotic wound on the left lateral tibial area on the left and several blisters. The patient remains on Cemiplimad infusions for the squamous cell carcinoma bilaterally. She is not felt to be a good candidate for radiation. Some of the skin changes superiorly in the upper legs bilaterally according to Dr. Ronnald Ramp related to these infusions the same like fleshy nodules to me not blisters 6/13; patient presents for follow-up. We have been using silver alginate under 3 layer compression bilaterally. She has no issues or complaints today. 6/19; patient presents for follow-up. She will see her oncologist in 3 days and next week her dermatologist. She has no new complaints today.  We are using silver alginate under 3 layer compression bilaterally. 6/29; patient presents for follow-up. She she saw Dr. Darrin Nipper last week and States she received injections to the lesions on her legs. We will ask for office visit records. She could not be wrapped with compression and has more drainage on exam today. 7/6; patient presents for follow-up. She continues to receive injections to the lesions in her legs by her dermatologist. She had this done earlier today. She has no issues or complaints today. 7/13; patient presents for follow-up. She is following with Dr.Kavuri once weekly for treatment of her SCCs/KAs with IL5-FU. We have been wrapping her with silver alginate under Kerlix/Coban. She has no issues or complaints today. 7/20; patient presents for follow-up. She has elected to stop doing weekly treatments for her squamous cell carcinoma lesions. She states she experiences too much pain from  the injections. She is continuing her immunotherapy infusions. Next 1 is scheduled in August. T the legs we have been using silver alginate o under Kerlix/Coban. Her wounds actually look better today. 7/27; patient presents for follow-up. We have been using Medihoney and silver alginate under compression therapy. She has no issues or complaints today. 8/1; patient presents for follow-up. We have been using Medihoney and silver alginate under compression therapy. She reports no issues. She is scheduled for her infusion for her SCC later this week. 8/22; patient presents for follow-up. We have been using Medihoney and silver alginate under compression therapy. She reports no issues today. She has moved back home from the skilled nursing facility. She has home health that will be coming out to do OT and PT She would like for them to also do wound . care.. Electronic Signature(s) Signed: 09/10/2021 3:51:23 PM By: Kalman Shan DO Entered By: Kalman Shan on 09/10/2021 15:09:55 -------------------------------------------------------------------------------- Physical Exam Details Patient Name: Date of Service: [name], [place]. 09/10/2021 1:30 PM Medical Record Number: [id number] Patient Account Number: [id number] Date of Birth/Sex: Treating RN: January 02, 1926 (88 y.o. [name] Primary Care Provider: [name] Other Clinician: Referring Provider: Treating Provider/Extender: [name] in Treatment: 56 Constitutional respirations regular, non-labored and within target range for patient.. Cardiovascular 2+ dorsalis pedis/posterior tibialis pulses. Psychiatric pleasant and cooperative. Notes Few small scattered open wounds to her lower extremities bilaterally limited to skin breakdown. 3 areas suspicious for malignancy. Electronic Signature(s) Signed: 09/10/2021 3:51:23 PM By: Kalman Shan DO Entered By: Kalman Shan on 09/10/2021  15:11:17 -------------------------------------------------------------------------------- Physician Orders Details Patient Name: Date of Service: [name], [place]. 09/10/2021 1:30 PM Medical Record Number: [id number] Patient Account Number: [id number] Date of Birth/Sex: Treating RN: 14-Oct-1925 (88 y.o. [name], [name] Primary Care Provider: [name] Other Clinician: Referring Provider: Treating Provider/Extender: [name] in Treatment: 64 Verbal / Phone Orders: No Diagnosis Coding ICD-10 Coding Code Description C44.92 Squamous cell carcinoma of skin, unspecified L97.812 Non-pressure chronic ulcer of other part of right lower leg with fat layer exposed L97.822 Non-pressure chronic ulcer of other part of left lower leg with fat layer exposed I89.0 Lymphedema, not elsewhere classified I87.313 Chronic venous hypertension (idiopathic) with ulcer of bilateral lower extremity Follow-up Appointments ppointment in: - Dr. Heber Galien and [place], Room 8 10/07/2021 130pm Monday Return A Anesthetic (In clinic) Topical Lidocaine 5% applied to wound bed Bathing/ Shower/ Hygiene May shower with protection but do not get wound dressing(s) wet. May shower and wash wound with soap and water. - with dressing changes may wash wounds  with soap and water. Edema Control - Lymphedema / SCD / Other Lymphedema Pumps. Use Lymphedema pumps on leg(s) 2-3 times a day for 45-60 minutes. If wearing any wraps or hose, do not remove them. Continue exercising as instructed. - use one hour each time twice a day. Elevate legs to the level of the heart or above for 30 minutes daily and/or when sitting, a frequency of: - elevate the legs throughout the day heart level if possible. Avoid standing for long periods of time. Exercise regularly Additional Orders / Instructions Follow [facility] wound care orders this week; continue Home Health for wound care.  May utilize formulary equivalent dressing for wound treatment orders unless otherwise specified. - home health to change weekly and when patient comes to wound center home health will not have to change the dressings on that week. Other Home Health Orders/Instructions: - [place]. Wound Treatment Wound #20 - Lower Leg Wound Laterality: Right, Circumferential Cleanser: Soap and Water (Home Health) 1 x Per Week/30 Days Discharge Instructions: May shower and wash wound with dial antibacterial soap and water prior to dressing change. Cleanser: Wound Cleanser (Home Health) 1 x Per Week/30 Days Discharge Instructions: Cleanse the wound with wound cleanser prior to applying a clean dressing using gauze sponges, not tissue or cotton balls. Peri-Wound Care: cetaphil lotion (Home Health) 1 x Per Week/30 Days Discharge Instructions: patient to provide to nurses to apply to both legs. Prim Dressing: Maxorb Extra Calcium Alginate Dressing, 4x4 in (Home Health) 1 x Per Week/30 Days ary Discharge Instructions: Apply calcium alginate to wound bed as instructed Prim Dressing: MediHoney Gel, tube 1.5 (oz) ([place]) 1 x Per Week/30 Days ary Discharge Instructions: Apply to wound bed as instructed Secondary Dressing: ABD Pad, 8x10 ([place]) 1 x Per Week/30 Days Discharge Instructions: Apply over primary dressing as directed. Compression Wrap: ThreePress (3 layer compression wrap) (Home Health) 1 x Per Week/30 Days Discharge Instructions: Apply three layer compression as directed. ***PLEASE USE KERLIX INSTEAD OF COTTON.*** Wound #7RR - Lower Leg Wound Laterality: Left, Circumferential Cleanser: Soap and Water (Home Health) 1 x Per Week/30 Days Discharge Instructions: May shower and wash wound with dial antibacterial soap and water prior to dressing change. Cleanser: Wound Cleanser (Home Health) 1 x Per Week/30 Days Discharge Instructions: Cleanse the wound with wound cleanser prior to  applying a clean dressing using gauze sponges, not tissue or cotton balls. Peri-Wound Care: cetaphil lotion (Home Health) 1 x Per Week/30 Days Discharge Instructions: patient to provide to nurses to apply to both legs. Prim Dressing: Maxorb Extra Calcium Alginate Dressing, 4x4 in (Home Health) 1 x Per Week/30 Days ary Discharge Instructions: Apply calcium alginate to wound bed as instructed Prim Dressing: MediHoney Gel, tube 1.5 (oz) ([place]) 1 x Per Week/30 Days ary Discharge Instructions: Apply to wound bed as instructed Secondary Dressing: ABD Pad, 8x10 ([place]) 1 x Per Week/30 Days Discharge Instructions: Apply over primary dressing as directed. Compression Wrap: ThreePress (3 layer compression wrap) (Home Health) 1 x Per Week/30 Days Discharge Instructions: Apply three layer compression as directed. ***PLEASE USE KERLIX INSTEAD OF COTTON.*** Electronic Signature(s) Signed: 09/10/2021 3:51:23 PM By: Kalman Shan DO Entered By: Kalman Shan on 09/10/2021 15:11:29 -------------------------------------------------------------------------------- Problem List Details Patient Name: Date of Service: [name], [place]. 09/10/2021 1:30 PM Medical Record Number: [id number] Patient Account Number: [id number] Date of Birth/Sex: Treating RN: 1925-05-22 (88 y.o. [name] Primary Care Provider: [name] Other Clinician: Referring  Provider: Treating Provider/Extender: [name] in Treatment: 96 Active Problems ICD-10 Encounter Code Description Active Date MDM Diagnosis C44.92 Squamous cell carcinoma of skin, unspecified 06/11/2020 No Yes L97.812 Non-pressure chronic ulcer of other part of right lower leg with fat layer 06/11/2020 No Yes exposed L97.822 Non-pressure chronic ulcer of other part of left lower leg with fat layer exposed5/23/2022 No Yes I89.0 Lymphedema, not elsewhere classified 01/31/2021 No Yes I87.313 Chronic  venous hypertension (idiopathic) with ulcer of bilateral lower extremity 03/21/2021 No Yes Inactive Problems Resolved Problems Electronic Signature(s) Signed: 09/10/2021 3:51:23 PM By: Kalman Shan DO Entered By: Kalman Shan on 09/10/2021 15:07:17 -------------------------------------------------------------------------------- Progress Note Details Patient Name: Date of Service: [name], [place]. 09/10/2021 1:30 PM Medical Record Number: [id number] Patient Account Number: [id number] Date of Birth/Sex: Treating RN: August 20, 1925 (88 y.o. [name], [name] Primary Care Provider: [name] Other Clinician: Referring Provider: Treating Provider/Extender: [name] in Treatment: 71 Subjective Chief Complaint Information obtained from Patient Bilateral lower extremity wounds that have been biopsied and positive for squamous cell carcinoma Bilateral lower extremity wounds due to chronic venous insufficiency/lymphedema History of Present Illness (HPI) The following HPI elements were documented for the patient's wound: Location: left leg Admission 5/23 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 She states that the wound on the right has been present for a year. The 1 on the left has opened up 1 month ago. She is followed with oncology for this issue as she had biopsies that showed squamous cell carcinoma. She is also seeing radiation oncology for treatment options. She presents today because she would like for her wounds to be healed by [place]. She currently denies signs of infection. 6/1; patient presents for 1 week follow-up. She states she has tolerated the leg wraps well. She states these do not bother her and is happy  to continue with them. She is scheduled to see her oncologist today to go over treatment options for the bilateral lower extremity squamous cell carcinoma. Radiation is currently not a recommended option. Patient states she overall feels well. 6/22; patient presents for 3-week follow-up. She has tolerated the wraps well until her last wrap where she states they were uncomfortable. She attributes this to the home health nurse. She denies signs of infection. She has started her first treatment of antibody infusions for her Bilateral lower extremity squamous cell carcinoma. She has no complaints today. 7/21; patient presents for 1 month follow-up. Unfortunately she has not had good experience with her wrap changes with home health. She would like to do her own dressing changes. She continues to do her antibody infusions. She denies signs of infection. 7/28; patient presents for 1 week follow-up. At last clinic visit she was switched to daily dressing changes due to issues with the wrap and home health placing them. Unfortunately she has developed weeping to her legs bilaterally. She would like to be placed in wraps today. She would also like to follow with [place] weekly for wrap changes instead of having home health change them. She denies signs of infection. 8/4; patient presents for 1 week follow-up. She has tolerated the Kerlix/Coban wraps well. She no longer has weeping to her legs. She took the wrap off 1 day before coming in to  be able to take a shower. She has no issues or complaints today. She denies signs of infection. 8/18; patient presents for follow-up. Patient has tolerated the wraps well. She brought her Velcro compression wraps today. She has no issues or complaints today. She had her chemotherapy infusion yesterday without issues. She denies signs of infection. 8/25; patient presents for follow-up. She used her juxta lite compressions for the past week. It is unclear if she is able to put  these on correctly since she states she has a hard time getting them to look right. She reports 2 open wounds. She currently denies signs of infection. 9/1; patient presents for 1 week follow-up. She has 1 open wound. She tolerated the compression wraps well. She currently denies signs of infection. 9/8; patient presents for 1 week follow-up. She has 2 open wounds 1 on each leg. She has tolerated the compression wrap well. She currently denies signs of infection. 9/15; patient presents for 1 week follow-up. She now has 3 wounds. 2 on the left and 1 on the right. She continues to tolerate the compression wrap well. She currently denies signs of infection. She has obtained furosemide by her primary care physician and would like to discuss when to take this. 9/22; patient presents for 1 week follow-up. She has scattered wounds on her lower extremities bilaterally. She did take furosemide twice in the past week. She does not recall having to urinate more frequently. She tolerated the 3 layer compression well. She denies signs of infection. 10/13; patient has not been here recently because of [place]. Apparently the facility was only putting gauze on her legs. This is a patient I do not normally see. 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 Ronnald Ramp at [facility] dermatology. She is quite convincing that she did not have radiation to her lower legs. It is likely she also has significant chronic venous insufficiency stasis dermatitis. We have been using silver alginate under kerlix Coban. She has obvious open areas medially on the right and areas on the left. She also has areas of extensive dry flaking adherent areas on the right and to a lesser extent on the left anterior. Nodular areas on the left lateral lower leg left posterior calf and right mid calf medially. 10/21; patient presents for follow-up. She has no issues or  complaints today. She has tolerated the 3 layer compression well bilaterally. She denies signs of infection. 10/27; patient presents for follow-up. She continues to tolerate the 3 layer compression wrap well. She denies signs of infection. 11/30; patient presents for follow-up. She has no issues or complaints today. 11/10; patient arrived in clinic today for nurse visit accompanied by her daughter from [place]. The daughter had multiple questions so we turned this into doctors visit. Apparently after the last time I saw this woman in October she went to see Dr. Ronnald Ramp but he did not take the wraps off. She previously was treated with Cemiplimab for her squamous cell carcinoma. She did not receive radiation to her legs. I had wanted Dr. Ronnald Ramp to look at this because of the exceptionally damaged skin on her lower legs bilaterally. She has odd looking wounds on the left medial lower leg also extending posteriorly which we have not made a lot of progress on. But she also has a raised hyperkeratotic nodules on the right anterior tibial area plaques on the left medial lower thigh. These are not  areas that are under compression. We have been using silver alginate on any open areas Liberal TCA under 3 layer compression. 11/17; patient presents for follow-up. She had her wraps taken off yesterday for her dermatology appointment. She reports excessive weeping to her legs bilaterally after the wraps were taken off. She currently denies signs of infection. 12/12/2020 upon evaluation today patient appears to be doing about the same in regard to her wounds. She was actually started on Cipro after having biopsies apparently at her dermatology clinic she does not have the results back from the actual biopsy but the culture did return and apparently they placed her on the Cipro she started that just this morning. Other than that her legs appear to be doing okay at this time. 12/1; patient presents for follow-up. She has  no issues or complaints today. She has started Lasix 40 mg daily to help with her bilateral leg swelling. 12/8; patient presents for follow-up. She has no issues or complaints today. 12/15; patient presents for 1 week follow-up. She has no issues or complaints today. 12/22; patient presents for follow-up. She has no issues or complaints today. 1/5; patient presents for follow-up. She has no issues or complaints today. She has tolerated the compression wraps well. 1/12; patient presents for follow-up. She is tolerated the compression wrap well and has no issues or complaints today. She denies signs of infection. 1/19; patient presents for follow-up. She took the compression wraps off 1 to 2 days ago to take a shower and did not have compression wraps replaced. She reports increased blistering throughout her legs bilaterally. She currently denies signs of infection. 1/26; patient presents for follow-up. She has no issues or complaints today. She tolerated the compression wraps well. She has not heard from oncology to schedule an appointment. 2/2; patient presents for follow-up. She has no issues or complaints today. She continues to tolerate the compression wrap well. 2/16; patient presents for follow-up. She has no issues or complaints today. 2/23; patient presents for follow-up. She has no issues or complaints today. Tomorrow she has an appointment with oncology to look at the suspicious lesion on her right lower extremity. She currently denies signs of infection. 3/2; patient presents for follow-up. She has been using her juxta light compression to the right lower extremity however there has been increased swelling and opening of wounds throughout the legs with weeping. She currently denies signs of infection. She had no issues with the compression wrap to the left lower extremity. 3/16; patient presents for follow-up. She has no issues or complaints today. She states that she has been restarted on  infusions for her squamous cell carcinoma of her legs. She has also been increased on Lasix to 40 mg twice daily. She has noticed a decrease in swelling to her legs. 3/23; patient presents for follow-up. She starts her second infusion next week for her squamous cell carcinoma lesions to her legs. She reports no issues with the compression wraps. 3/30; patient presents for follow-up. She has no issues or complaints today. 4/6; patient presents for follow-up. Her left lower extremity wounds have healed. She has her juxta light compression with her today. She has no issues or complaints today. 4/13; this is a patient we actually discharged to juxta lite stockings on the left leg the last time she was here. She came in on Monday for [place] to look at the leg and a nurse visit. She had massive swelling and numerous blisters and wound reopening. We put her  back in 3 layer compression although I did not generate a note. Silver alginate on the wounded areas. We have also been doing the same on the right. In the meantime she is obtained her external compression pumps that were previously ordered and she started to use them. She has skin cancers that are obvious on the right leg x2 her appointment with Dr. Jarome Matin of dermatology is on May 5.. She is also receiving weekly chemotherapy for recurrent presumably squamous cell carcinomas apparently has had 2 treatments 4/20; patient presents for follow-up. She has her new juxta lite compressions today. She continues to have open wounds to the left lower extremity. She has follow-up with dermatology in 2 weeks. She continues to have IV infusions for her squamous cell carcinoma lesions on her legs. 4/28; the patient has bilateral lower extremity wounds predominantly on the right lateral upper leg, right anterior lower leg which in itself is probably a recurrent cancer as well as the left lateral lower leg. She has recurrent squamous cell carcinoma currently being  treated with an IV infusion. She also has scattered nodules on her upper lower legs and thighs I am not certain of all of this is felt to be malignant as well We have been using silver alginate on the open areas wrapping her legs. She also has her external compression pumps at home but I am not clear that she is going to use these 5/2; patient presents for follow-up. She has not been taking her diuretics as prescribed. She sees Dr. Dian Situ, dermatology at the end of the week. She tolerated the compression wraps well today. She has her juxta lite compressions. 5/8; patient presents for follow-up. She saw Dr. Dian Situ, dermatology and she has nothing to report on. She has been using her juxta lite compression to the right lower extremity. She has tolerated the compression wrap well in the left lower extremity. She has not been taking her diuretic. She is scheduled to continue her infusions for her squamous cell carcinoma on her legs. 5/18; patient presents for follow-up. We wrapped her in 3 layer compression at last clinic visit. She has no issues or complaints today. 5/22; patient presents for follow-up. She has been wrapped in 3 layer compression bilaterally to her lower extremities over the past week. She has been scratching at the top of the wrap and picking at her skin. This area has increased warmth and erythema. 5/30; patient presents for follow-up. She completed Keflex with resolution of her symptoms to the right lower extremity. She no longer has increased warmth or erythema. She continues her infusions for her squamous cell carcinoma lesions. We continue to wrap her in 3 layer compression with silver alginate. She currently denies signs of infection. 6/8; the patient went to see Dr. Ronnald Ramp of dermatology 2 days ago unfortunately we do not have his notes. They removed her compression wraps of course but did not adequately replace them. She comes in today with 3 wounds on the right lateral upper calf 1  medially below the obvious squamous cell carcinoma there is a large necrotic wound on the left lateral tibial area on the left and several blisters. The patient remains on Cemiplimad infusions for the squamous cell carcinoma bilaterally. She is not felt to be a good candidate for radiation. Some of the skin changes superiorly in the upper legs bilaterally according to Dr. Ronnald Ramp related to these infusions the same like fleshy nodules to me not blisters 6/13; patient presents for follow-up. We have  been using silver alginate under 3 layer compression bilaterally. She has no issues or complaints today. 6/19; patient presents for follow-up. She will see her oncologist in 3 days and next week her dermatologist. She has no new complaints today. We are using silver alginate under 3 layer compression bilaterally. 6/29; patient presents for follow-up. She she saw Dr. Darrin Nipper last week and States she received injections to the lesions on her legs. We will ask for office visit records. She could not be wrapped with compression and has more drainage on exam today. 7/6; patient presents for follow-up. She continues to receive injections to the lesions in her legs by her dermatologist. She had this done earlier today. She has no issues or complaints today. 7/13; patient presents for follow-up. She is following with Dr.Kavuri once weekly for treatment of her SCCs/KAs with [id number]. We have been wrapping her with silver alginate under Kerlix/Coban. She has no issues or complaints today. 7/20; patient presents for follow-up. She has elected to stop doing weekly treatments for her squamous cell carcinoma lesions. She states she experiences too much pain from the injections. She is continuing her immunotherapy infusions. Next 1 is scheduled in August. T the legs we have been using silver alginate o under Kerlix/Coban. Her wounds actually look better today. 7/27; patient presents for follow-up. We have been using  Medihoney and silver alginate under compression therapy. She has no issues or complaints today. 8/1; patient presents for follow-up. We have been using Medihoney and silver alginate under compression therapy. She reports no issues. She is scheduled for her infusion for her SCC later this week. 8/22; patient presents for follow-up. We have been using Medihoney and silver alginate under compression therapy. She reports no issues today. She has moved back home from the skilled nursing facility. She has home health that will be coming out to do OT and PT She would like for them to also do wound . care.. Patient History Information obtained from Patient. Family History Unknown History. Social History Never smoker, Marital Status - Single, Alcohol Use - Never, Drug Use - No History, Caffeine Use - Never. Medical History Eyes Denies history of Cataracts, Optic Neuritis Ear/Nose/Mouth/Throat Denies history of Chronic sinus problems/congestion, Middle ear problems Hematologic/Lymphatic Patient has history of Anemia Denies history of Hemophilia, Human Immunodeficiency Virus, Lymphedema, Sickle Cell Disease Respiratory Denies history of Aspiration, Asthma, Chronic Obstructive Pulmonary Disease (COPD), Pneumothorax, Sleep Apnea, Tuberculosis Cardiovascular Patient has history of Arrhythmia - Atrial Flutter, A fibb, Congestive Heart Failure, Hypertension Denies history of Angina, Coronary Artery Disease, Deep Vein Thrombosis, Hypotension, Myocardial Infarction, Peripheral Arterial Disease, Peripheral Venous Disease, Phlebitis, Vasculitis Gastrointestinal Patient has history of Colitis Denies history of Cirrhosis , Crohnoos, Hepatitis A, Hepatitis B, Hepatitis C Endocrine Denies history of Type I Diabetes, Type II Diabetes Genitourinary Denies history of End Stage Renal Disease Immunological Denies history of Lupus Erythematosus, Raynaudoos, Scleroderma Integumentary (Skin) Denies history  of History of Burn Musculoskeletal Patient has history of Osteoarthritis Denies history of Gout, Rheumatoid Arthritis, Osteomyelitis Neurologic Denies history of Dementia, Neuropathy, Quadriplegia, Paraplegia, Seizure Disorder Oncologic Denies history of Received Chemotherapy, Received Radiation Hospitalization/Surgery History - removal of rod left leg. 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 Vitals Time Taken: 1:30 PM, Height: 65 in, Weight: 163 lbs, BMI: 27.1, Temperature: 98.8 F, Pulse: 60 bpm, Respiratory Rate: 20 breaths/min, Blood Pressure: 104/67 mmHg. Cardiovascular  2+ dorsalis pedis/posterior tibialis pulses. Psychiatric pleasant and cooperative. General Notes: Few small scattered open wounds to her lower extremities bilaterally limited to skin breakdown. 3 areas suspicious for malignancy. Integumentary (Hair, Skin) Wound #20 status is Open. Original cause of wound was Gradually Appeared. The date acquired was: 06/25/2021. The wound has been in treatment 10 weeks. The wound is located on the Right,Circumferential Lower Leg. The wound measures 1.6cm length x 0.7cm width x 0.1cm depth; 0.88cm^2 area and 0.088cm^3 volume. There is Fat Layer (Subcutaneous Tissue) exposed. There is no tunneling or undermining noted. There is a medium amount of serosanguineous drainage noted. The wound margin is distinct with the outline attached to the wound base. There is large (67-100%) red granulation within the wound bed. There is no necrotic tissue within the wound bed. Wound #7RR status is Open. Original cause of wound was Blister. The date acquired was: 04/20/2020. The wound has been in treatment 65 weeks. The wound is located on the Left,Circumferential Lower Leg. The  wound measures 1.6cm length x 0.7cm width x 0.1cm depth; 0.88cm^2 area and 0.088cm^3 volume. There is Fat Layer (Subcutaneous Tissue) exposed. There is no tunneling or undermining noted. There is a medium amount of serosanguineous drainage noted. The wound margin is distinct with the outline attached to the wound base. There is large (67-100%) red, pink granulation within the wound bed. There is no necrotic tissue within the wound bed. Assessment Active Problems ICD-10 Squamous cell carcinoma of skin, unspecified Non-pressure chronic ulcer of other part of right lower leg with fat layer exposed Non-pressure chronic ulcer of other part of left lower leg with fat layer exposed Lymphedema, not elsewhere classified Chronic venous hypertension (idiopathic) with ulcer of bilateral lower extremity Patient's wounds have improved in size and appearance since last clinic visit. Her SCC lesions are stable. Overall she is doing well with Medihoney and silver alginate under compression therapy. She now has home health and they will be changing the wraps. She will see [place] back in 2 to 3 weeks. She continues with infusions for her squamous cell carcinoma lesions on her legs. Procedures Wound #20 Pre-procedure diagnosis of Wound #20 is a Lymphedema located on the Right,Circumferential Lower Leg . There was a Three Layer Compression Therapy Procedure by Deon Pilling, RN. Post procedure Diagnosis Wound #20: Same as Pre-Procedure Wound #7RR Pre-procedure diagnosis of Wound #7RR is a Malignant Wound located on the Left,Circumferential Lower Leg . There was a Three Layer Compression Therapy Procedure by Deon Pilling, RN. Post procedure Diagnosis Wound #7RR: Same as Pre-Procedure Plan Follow-up Appointments: Return Appointment in: - Dr. Heber [place] and [name], Room 8 10/07/2021 130pm Monday Anesthetic: (In clinic) Topical Lidocaine 5% applied to wound bed Bathing/ Shower/ Hygiene: May shower with protection but  do not get wound dressing(s) wet. May shower and wash wound with soap and water. - with dressing changes may wash wounds with soap and water. Edema Control - Lymphedema / SCD / Other: Lymphedema Pumps. Use Lymphedema pumps on leg(s) 2-3 times a day for 45-60 minutes. If wearing any wraps or hose, do not remove them. Continue exercising as instructed. - use one hour each time twice a day. Elevate legs to the level of the heart or above for 30 minutes daily and/or when sitting, a frequency of: - elevate the legs throughout the day heart level if possible. Avoid standing for long periods of time. Exercise regularly Additional Orders / Instructions: Follow Nutritious Diet Home Health: New wound care orders this week; continue Home  Health for wound care. May utilize formulary equivalent dressing for wound treatment orders unless otherwise specified. - home health to change weekly and when patient comes to wound center home health will not have to change the dressings on that week. Other Home Health Orders/Instructions: - [place]. WOUND #20: - Lower Leg Wound Laterality: Right, Circumferential Cleanser: Soap and Water (Home Health) 1 x Per Week/30 Days Discharge Instructions: May shower and wash wound with dial antibacterial soap and water prior to dressing change. Cleanser: Wound Cleanser (Home Health) 1 x Per Week/30 Days Discharge Instructions: Cleanse the wound with wound cleanser prior to applying a clean dressing using gauze sponges, not tissue or cotton balls. Peri-Wound Care: cetaphil lotion (Home Health) 1 x Per Week/30 Days Discharge Instructions: patient to provide to nurses to apply to both legs. Prim Dressing: Maxorb Extra Calcium Alginate Dressing, 4x4 in (Home Health) 1 x Per Week/30 Days ary Discharge Instructions: Apply calcium alginate to wound bed as instructed Prim Dressing: MediHoney Gel, tube 1.5 (oz) ([place]) 1 x Per Week/30 Days ary Discharge  Instructions: Apply to wound bed as instructed Secondary Dressing: ABD Pad, 8x10 ([place]) 1 x Per Week/30 Days Discharge Instructions: Apply over primary dressing as directed. Com pression Wrap: ThreePress (3 layer compression wrap) (Home Health) 1 x Per Week/30 Days Discharge Instructions: Apply three layer compression as directed. ***PLEASE USE KERLIX INSTEAD OF COTTON.*** WOUND #7RR: - Lower Leg Wound Laterality: Left, Circumferential Cleanser: Soap and Water (Home Health) 1 x Per Week/30 Days Discharge Instructions: May shower and wash wound with dial antibacterial soap and water prior to dressing change. Cleanser: Wound Cleanser (Home Health) 1 x Per Week/30 Days Discharge Instructions: Cleanse the wound with wound cleanser prior to applying a clean dressing using gauze sponges, not tissue or cotton balls. Peri-Wound Care: cetaphil lotion (Home Health) 1 x Per Week/30 Days Discharge Instructions: patient to provide to nurses to apply to both legs. Prim Dressing: Maxorb Extra Calcium Alginate Dressing, 4x4 in (Home Health) 1 x Per Week/30 Days ary Discharge Instructions: Apply calcium alginate to wound bed as instructed Prim Dressing: MediHoney Gel, tube 1.5 (oz) ([place]) 1 x Per Week/30 Days ary Discharge Instructions: Apply to wound bed as instructed Secondary Dressing: ABD Pad, 8x10 ([place]) 1 x Per Week/30 Days Discharge Instructions: Apply over primary dressing as directed. Com pression Wrap: ThreePress (3 layer compression wrap) (Home Health) 1 x Per Week/30 Days Discharge Instructions: Apply three layer compression as directed. ***PLEASE USE KERLIX INSTEAD OF COTTON.*** 1. Medihoney and silver alginate under 3 layer compression 2. Follow-up in 3 weeks Electronic Signature(s) Signed: 09/10/2021 3:51:23 PM By: Kalman Shan DO Entered By: Kalman Shan on 09/10/2021 15:16:03 -------------------------------------------------------------------------------- HxROS  Details Patient Name: Date of Service: [name], [place]. 09/10/2021 1:30 PM Medical Record Number: [id number] Patient Account Number: [id number] Date of Birth/Sex: Treating RN: February 05, 1925 (88 y.o. [name] Primary Care Provider: [name] Other Clinician: Referring Provider: Treating Provider/Extender: [name] in Treatment: 39 Information Obtained From Patient Eyes Medical History: Negative for: Cataracts; Optic Neuritis Ear/Nose/Mouth/Throat Medical History: Negative for: Chronic sinus problems/congestion; Middle ear problems Hematologic/Lymphatic Medical History: Positive for: Anemia Negative for: Hemophilia; Human Immunodeficiency Virus; Lymphedema; Sickle Cell Disease Respiratory Medical History: Negative for: Aspiration; Asthma; Chronic Obstructive Pulmonary Disease (COPD); Pneumothorax; Sleep Apnea; Tuberculosis Cardiovascular Medical History: Positive for: Arrhythmia - Atrial Flutter, A fibb; Congestive Heart Failure; Hypertension Negative for: Angina; Coronary Artery Disease; Deep Vein Thrombosis; Hypotension; Myocardial  Infarction; Peripheral Arterial Disease; Peripheral Venous Disease; Phlebitis; Vasculitis Past Medical History Notes: hyperlipidemia Gastrointestinal Medical History: Positive for: Colitis Negative for: Cirrhosis ; Crohns; Hepatitis A; Hepatitis B; Hepatitis C Endocrine Medical History: Negative for: Type I Diabetes; Type II Diabetes Past Medical History Notes: hypothyroidism Genitourinary Medical History: Negative for: End Stage Renal Disease Immunological Medical History: Negative for: Lupus Erythematosus; Raynauds; Scleroderma Integumentary (Skin) Medical History: Negative for: History of Burn Musculoskeletal Medical History: Positive for: Osteoarthritis Negative for: Gout; Rheumatoid Arthritis; Osteomyelitis Neurologic Medical History: Negative for: Dementia; Neuropathy;  Quadriplegia; Paraplegia; Seizure Disorder Past Medical History Notes: lumbar spindylolysis Oncologic Medical History: Negative for: Received Chemotherapy; Received Radiation Past Medical History Notes: BLE squamous ceel carcionoma Immunizations Pneumococcal Vaccine: Received Pneumococcal Vaccination: Yes Received Pneumococcal Vaccination On or After 60th Birthday: No Implantable Devices None Hospitalization / Surgery History Type of Hospitalization/Surgery removal of rod left leg Family and Social History Unknown History: Yes; Never smoker; Marital Status - Single; Alcohol Use: Never; Drug Use: No History; Caffeine Use: Never; Financial Concerns: No; Food, Clothing or Shelter Needs: No; Support System Lacking: No; Transportation Concerns: No Electronic Signature(s) Signed: 09/10/2021 3:51:23 PM By: Kalman Shan DO Signed: 09/10/2021 3:55:04 PM By: Deon Pilling RN, BSN Entered By: Kalman Shan on 09/10/2021 15:10:03 -------------------------------------------------------------------------------- SuperBill Details Patient Name: Date of Service: [name], Navya A. 09/10/2021 Medical Record Number: [id number] Patient Account Number: [id number] Date of Birth/Sex: Treating RN: 1925-05-14 (88 y.o. [name], [name] Primary Care Provider: [name] Other Clinician: Referring Provider: Treating Provider/Extender: [name] in Treatment: 47 Diagnosis Coding ICD-10 Codes Code Description C44.92 Squamous cell carcinoma of skin, unspecified L97.812 Non-pressure chronic ulcer of other part of right lower leg with fat layer exposed L97.822 Non-pressure chronic ulcer of other part of left lower leg with fat layer exposed I89.0 Lymphedema, not elsewhere classified I87.313 Chronic venous hypertension (idiopathic) with ulcer of bilateral lower extremity Facility Procedures CPT4: Code [id number] Description: 81 BILATERAL: Application  of multi-layer venous compression system; leg (below knee), including ankle and t. Modifier: Quantity: 1 Physician Procedures Electronic Signature(s) Signed: 09/10/2021 3:51:23 PM By: Kalman Shan DO Entered By: Kalman Shan on 09/10/2021 15:16:31

## 2021-09-11 ENCOUNTER — Other Ambulatory Visit: Payer: Self-pay

## 2021-09-11 MED ORDER — BUSPIRONE HCL 15 MG PO TABS: 15.0000 mg | ORAL_TABLET | Freq: Three times a day (TID) | ORAL | 5 refills | Status: DC

## 2021-09-11 MED ORDER — SERTRALINE HCL 25 MG PO TABS: 25.0000 mg | ORAL_TABLET | Freq: Every morning | ORAL | 5 refills | Status: DC

## 2021-09-11 NOTE — Telephone Encounter (Signed)
Continue with the Zoloft.  Take buspirone 3 times a day if it is working better for the anxiety control this way. Thanks,

## 2021-09-11 NOTE — Telephone Encounter (Signed)
Faxed over new scripts for both meds...Johny Chess

## 2021-09-12 ENCOUNTER — Inpatient Hospital Stay: Payer: Medicare Other

## 2021-09-12 ENCOUNTER — Ambulatory Visit: Payer: Medicare Other | Admitting: Oncology

## 2021-09-12 ENCOUNTER — Inpatient Hospital Stay (HOSPITAL_BASED_OUTPATIENT_CLINIC_OR_DEPARTMENT_OTHER): Payer: Medicare Other | Admitting: Oncology

## 2021-09-12 ENCOUNTER — Other Ambulatory Visit: Payer: Self-pay

## 2021-09-12 ENCOUNTER — Ambulatory Visit: Payer: Medicare Other

## 2021-09-12 ENCOUNTER — Other Ambulatory Visit: Payer: Medicare Other

## 2021-09-12 ENCOUNTER — Encounter (HOSPITAL_BASED_OUTPATIENT_CLINIC_OR_DEPARTMENT_OTHER): Payer: Medicare Other | Admitting: General Surgery

## 2021-09-12 ENCOUNTER — Other Ambulatory Visit: Payer: Self-pay | Admitting: Internal Medicine

## 2021-09-12 VITALS — BP 133/79 | HR 70 | Temp 97.9°F | Resp 16 | Ht 65.0 in

## 2021-09-12 VITALS — BP 114/60 | HR 55 | Temp 97.9°F | Resp 16

## 2021-09-12 DIAGNOSIS — C44722 Squamous cell carcinoma of skin of right lower limb, including hip: Secondary | ICD-10-CM | POA: Diagnosis not present

## 2021-09-12 DIAGNOSIS — Z79899 Other long term (current) drug therapy: Secondary | ICD-10-CM | POA: Diagnosis not present

## 2021-09-12 DIAGNOSIS — C44721 Squamous cell carcinoma of skin of unspecified lower limb, including hip: Secondary | ICD-10-CM

## 2021-09-12 DIAGNOSIS — C4490 Unspecified malignant neoplasm of skin, unspecified: Secondary | ICD-10-CM

## 2021-09-12 DIAGNOSIS — Z5112 Encounter for antineoplastic immunotherapy: Secondary | ICD-10-CM | POA: Diagnosis not present

## 2021-09-12 DIAGNOSIS — C44729 Squamous cell carcinoma of skin of left lower limb, including hip: Secondary | ICD-10-CM | POA: Diagnosis not present

## 2021-09-12 LAB — CBC WITH DIFFERENTIAL (CANCER CENTER ONLY)
Abs Immature Granulocytes: 0.02 10*3/uL (ref 0.00–0.07)
Basophils Absolute: 0.1 10*3/uL (ref 0.0–0.1)
Basophils Relative: 1 %
Eosinophils Absolute: 0.2 10*3/uL (ref 0.0–0.5)
Eosinophils Relative: 5 %
HCT: 34.3 % — ABNORMAL LOW (ref 36.0–46.0)
Hemoglobin: 11.2 g/dL — ABNORMAL LOW (ref 12.0–15.0)
Immature Granulocytes: 0 %
Lymphocytes Relative: 20 %
Lymphs Abs: 1 10*3/uL (ref 0.7–4.0)
MCH: 34.4 pg — ABNORMAL HIGH (ref 26.0–34.0)
MCHC: 32.7 g/dL (ref 30.0–36.0)
MCV: 105.2 fL — ABNORMAL HIGH (ref 80.0–100.0)
Monocytes Absolute: 0.7 10*3/uL (ref 0.1–1.0)
Monocytes Relative: 13 %
Neutro Abs: 3.1 10*3/uL (ref 1.7–7.7)
Neutrophils Relative %: 61 %
Platelet Count: 235 10*3/uL (ref 150–400)
RBC: 3.26 MIL/uL — ABNORMAL LOW (ref 3.87–5.11)
RDW: 13.9 % (ref 11.5–15.5)
WBC Count: 5 10*3/uL (ref 4.0–10.5)
nRBC: 0 % (ref 0.0–0.2)

## 2021-09-12 LAB — CMP (CANCER CENTER ONLY)
ALT: 10 U/L (ref 0–44)
AST: 25 U/L (ref 15–41)
Albumin: 3.6 g/dL (ref 3.5–5.0)
Alkaline Phosphatase: 78 U/L (ref 38–126)
Anion gap: 5 (ref 5–15)
BUN: 19 mg/dL (ref 8–23)
CO2: 30 mmol/L (ref 22–32)
Calcium: 9.4 mg/dL (ref 8.9–10.3)
Chloride: 102 mmol/L (ref 98–111)
Creatinine: 0.97 mg/dL (ref 0.44–1.00)
GFR, Estimated: 54 mL/min — ABNORMAL LOW (ref >60)
Glucose, Bld: 139 mg/dL — ABNORMAL HIGH (ref 70–99)
Potassium: 4.2 mmol/L (ref 3.5–5.1)
Sodium: 137 mmol/L (ref 135–145)
Total Bilirubin: 0.7 mg/dL (ref 0.3–1.2)
Total Protein: 7.4 g/dL (ref 6.5–8.1)

## 2021-09-12 LAB — TSH: TSH: 1.291 u[IU]/mL (ref 0.350–4.500)

## 2021-09-12 MED ORDER — SODIUM CHLORIDE 0.9 % IV SOLN
350.0000 mg | Freq: Once | INTRAVENOUS | Status: AC
  Administered 2021-09-12: 350 mg via INTRAVENOUS
  Filled 2021-09-12: qty 7

## 2021-09-12 MED ORDER — SODIUM CHLORIDE 0.9 % IV SOLN: Freq: Once | INTRAVENOUS | Status: AC

## 2021-09-12 NOTE — Patient Instructions (Signed)
[name]  Discharge Instructions: Thank you for choosing [facility] to provide your oncology and hematology care.   If you have a lab appointment with the Cancer Center, please go directly to the Cancer Center and check in at the registration area.   Wear comfortable clothing and clothing appropriate for easy access to any Portacath or PICC line.   We strive to give you quality time with your provider. You may need to reschedule your appointment if you arrive late (15 or more minutes).  Arriving late affects you and other patients whose appointments are after yours.  Also, if you miss three or more appointments without notifying the office, you may be dismissed from the clinic at the provider's discretion.      For prescription refill requests, have your pharmacy contact our office and allow 72 hours for refills to be completed.    Today you received the following chemotherapy and/or immunotherapy agents: Libtayo      To help prevent nausea and vomiting after your treatment, we encourage you to take your nausea medication as directed.  BELOW ARE SYMPTOMS THAT SHOULD BE REPORTED IMMEDIATELY: *FEVER GREATER THAN 100.4 F (38 C) OR HIGHER *CHILLS OR SWEATING *NAUSEA AND VOMITING THAT IS NOT CONTROLLED WITH YOUR NAUSEA MEDICATION *UNUSUAL SHORTNESS OF BREATH *UNUSUAL BRUISING OR BLEEDING *URINARY PROBLEMS (pain or burning when urinating, or frequent urination) *BOWEL PROBLEMS (unusual diarrhea, constipation, pain near the anus) TENDERNESS IN MOUTH AND THROAT WITH OR WITHOUT PRESENCE OF ULCERS (sore throat, sores in mouth, or a toothache) UNUSUAL RASH, SWELLING OR PAIN  UNUSUAL VAGINAL DISCHARGE OR ITCHING   Items with * indicate a potential emergency and should be followed up as soon as possible or go to the Emergency Department if any problems should occur.  Please show the CHEMOTHERAPY ALERT CARD or IMMUNOTHERAPY ALERT CARD at check-in to  the Emergency Department and triage nurse.  Should you have questions after your visit or need to cancel or reschedule your appointment, please contact  [facility]  Dept: [phone]  and follow the prompts.  Office hours are 8:00 a.m. to 4:30 p.m. Monday - Friday. Please note that voicemails left after 4:00 p.m. may not be returned until the following business day.  We are closed weekends and major holidays. You have access to a nurse at all times for urgent questions. Please call the main number to the clinic Dept: [phone] and follow the prompts.   For any non-urgent questions, you may also contact your provider using MyChart. We now offer e-Visits for anyone 18 and older to request care online for non-urgent symptoms. For details visit [email or web address].   Also download the MyChart app! Go to the app store, search "MyChart", open the app, select , and log in with your MyChart username and password.  Masks are optional in the cancer centers. If you would like for your care team to wear a mask while they are taking care of you, please let them know. You may have one support person who is at least 86 years old accompany you for your appointments. 

## 2021-09-12 NOTE — Progress Notes (Signed)
Hematology and Oncology Follow Up Visit  [name] [id number] 03-Apr-1925 88 y.o. 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  He developed a relapse in March 2023.  Her disease is locally advanced.   Prior Therapy:  She received Libtayo for 5 cycles concluded in September 2022.  Current therapy:  Libtayo 350 mg total dose restarted on March 26, 2021.  He is here for cycle 9 of therapy.  Interim History: Ms. Fesperman presents today for repeat evaluation.  Since last visit, she reports no major changes in her health.  She continues to tolerate Libtayo without any side effects.  He denies any nausea, vomiting or abdominal pain.  He denies any hospitalizations or illnesses.  She is currently staying at home after being discharged from a skilled nursing facility.  Lower extremity wounds continues to improve although she continues to follow-up with the wound clinic on a weekly basis.    Medications: Reviewed reviewed without changes. Current Outpatient Medications  Medication Sig Dispense Refill   acetaminophen (TYLENOL) 500 MG tablet Take 500 mg by mouth daily as needed for mild pain.      apixaban (ELIQUIS) 5 MG TABS tablet Take 1 tablet (5 mg total) by mouth 2 (two) times daily. NEEDS CARDIOLOGY APPOINTMENT FOR ELIQUIS REFILLS, PLEASE CALL OFFICE 60 tablet 0   B Complex-C (B-COMPLEX WITH VITAMIN C) tablet Take 1 tablet by mouth daily.     busPIRone (BUSPAR) 15 MG tablet Take 1 tablet (15 mg total) by mouth 3 (three) times daily. 90 tablet 5   CEMIPLIMAB-RWLC IV Inject 1 Dose into the vein every 21 ( twenty-one) days.     Cholecalciferol (VITAMIN D3) 1000 UNITS tablet Take 1,000 Units by mouth daily.     colestipol (COLESTID) 1 g tablet Take 1 tablet (1 g total) by mouth in the morning. 60 tablet 12   diltiazem (CARDIZEM CD) 120 MG 24 hr capsule  TAKE 1 CAPSULE BY MOUTH ONCE DAILY 30 capsule 11   doxycycline (VIBRA-TABS) 100 MG tablet TAKE 1 TABLET BY MOUTH ONCE DAILY 30 tablet 11   famotidine (PEPCID) 20 MG tablet Take 20 mg by mouth daily.     furosemide (LASIX) 40 MG tablet Take 1 tablet (40 mg total) by mouth 2 (two) times daily. 60 tablet 5   gabapentin (NEURONTIN) 100 MG capsule TAKE 1 CAPSULE BY MOUTH EVERY NIGHT AT BEDTIME *DO NOT CRUSH OR CHEW* **NOTE NEW INSTRUCTIONS* 30 capsule 5   hydrocortisone 2.5 % lotion Apply topically 3 (three) times daily. 240 mL 2   levothyroxine (SYNTHROID) 25 MCG tablet TAKE 1 TABLET BY MOUTH ONCE DAILY FOR THYROID *TAKE ON AN EMPTY STOMACH* 30 tablet 11   loperamide (IMODIUM) 2 MG capsule Take 1 capsule (2 mg total) by mouth as needed for diarrhea or loose stools. Take 1 tab with first loose stool then one tab after each loose stool for maximum dose of 8 tabs daily. Pt to call the office if need for greater then 8 tabs a day 60 capsule 0   methylPREDNISolone (MEDROL DOSEPAK) 4 MG TBPK tablet As directed 21 tablet 0   metoprolol succinate (TOPROL-XL) 50 MG 24 hr tablet TAKE 1/2 TABLET = 25 MG BY MOUTH TWICE DAILY. TAKE WITH OR IMMEDIATELY FOLLOWING A MEAL 30 tablet 10   Multiple Vitamins-Minerals (MULTIVITAMIN WITH MINERALS) tablet Take 1 tablet by mouth daily.  polyethylene glycol (MIRALAX) 17 g packet Take 17 g by mouth daily. 30 each 0   potassium chloride SA (KLOR-CON M) 20 MEQ tablet TAKE 1 TABLET BY MOUTH ONCE DAILY  ALONG W/ FUROSEMIDE AS NEED *TAKE WITH FOOD* *DO NOT CRUSH OR CHEW* *MAY DISSOLVE* 30 tablet 11   prochlorperazine (COMPAZINE) 5 MG tablet Take 1 tablet (5 mg total) by mouth every 8 (eight) hours as needed for nausea or vomiting. 20 tablet 1   sertraline (ZOLOFT) 25 MG tablet Take 1 tablet (25 mg total) by mouth every morning. 30 tablet 5   temazepam (RESTORIL) 15 MG capsule TAKE 1 CAPSULE BY MOUTH EVERY NIGHT AT BEDTIME AS NEEDED FOR SLEEP 30 capsule 3   No current  facility-administered medications for this visit.     Allergies:  Allergies  Allergen Reactions   Zosyn [Piperacillin Sod-Tazobactam So] Hives, Itching, Rash and Other (See Comments)    Morbilliform eruptions with itching- looks like Measles   Atorvastatin     myalgias   Atenolol Nausea And Vomiting   Clarithromycin Nausea And Vomiting   Codeine Sulfate Nausea Only   Levaquin [Levofloxacin] Nausea Only   Macrodantin Other (See Comments)    UNSPECIFIED    Oxycodone-Acetaminophen Nausea And Vomiting      Physical Exam:   Blood pressure 133/79, pulse 70, temperature 97.9 F (36.6 C), temperature source Temporal, resp. rate 16, height '5\' 5"'$  (1.651 m), SpO2 97 %.      ECOG: 2    General appearance: Comfortable appearing without any discomfort Head: Normocephalic without any trauma Oropharynx: Mucous membranes are moist and pink without any thrush or ulcers. Eyes: Pupils are equal and round reactive to light. Lymph nodes: No cervical, supraclavicular, inguinal or axillary lymphadenopathy.   Heart:regular rate and rhythm.  S1 and S2 without leg edema. Lung: Clear without any rhonchi or wheezes.  No dullness to percussion. Abdomin: Soft, nontender, nondistended with good bowel sounds.  No hepatosplenomegaly. Musculoskeletal: No joint deformity or effusion.  Full range of motion noted. Neurological: No deficits noted on motor, sensory and deep tendon reflex exam. Skin: Right upper extremity protrusion removed without any further growth.  Lower extremity wrapped without any edema or induration.        Lab Results: Lab Results  Component Value Date   WBC 6.6 08/22/2021   HGB 10.6 (L) 08/22/2021   HCT 33.1 (L) 08/22/2021   MCV 105.4 (H) 08/22/2021   PLT 285 08/22/2021     Chemistry      Component Value Date/Time   NA 139 08/22/2021 1151   NA 134 08/11/2018 1425   NA 137 07/28/2016 1334   K 3.7 08/22/2021 1151   K 3.8 07/28/2016 1334   CL 100 08/22/2021 1151    CO2 33 (H) 08/22/2021 1151   CO2 28 07/28/2016 1334   BUN 18 08/22/2021 1151   BUN 20 08/11/2018 1425   BUN 9.5 07/28/2016 1334   CREATININE 0.88 08/22/2021 1151   CREATININE 1.0 07/28/2016 1334      Component Value Date/Time   CALCIUM 9.0 08/22/2021 1151   CALCIUM 9.6 07/28/2016 1334   ALKPHOS 72 08/22/2021 1151   ALKPHOS 96 07/28/2016 1334   AST 30 08/22/2021 1151   AST 21 07/28/2016 1334   ALT 14 08/22/2021 1151   ALT 24 07/28/2016 1334   BILITOT 0.7 08/22/2021 1151   BILITOT 1.21 (H) 07/28/2016 1334         Impression and Plan:  88 year old with:  1.  Locally advanced squamous cell carcinoma of the skin involving bilateral lower extremities diagnosed in June 2022.    Risks and benefits of continuing lipid A were discussed at this time.  She has not experienced any major complications at this time.  She is experiencing possible benefit or have recommended the same dose and schedule.  Alternative treatment options including systemic chemotherapy, Erbitux among others.  She is agreeable to continue at this time.  The plan is to proceed with 3 more cycles and discontinue treatment after labs.   2.  Breast cancer: No active treatment needed at this time without any evidence of relapse.  3.  Nonhealing wounds in the lower extremities: Managed by the wound clinic on a weekly basis.      4.  Follow-up: In 3 weeks for repeat evaluation.      30 minutes were spent on this visit.  The time was dedicated to reviewing laboratory data, disease status update and outlining future plan of care discussion.   Zola Button, MD 8/24/202312:18 PM

## 2021-09-12 NOTE — Telephone Encounter (Signed)
Check [place] registry last filled 10/13/2019.Marland KitchenJohny Chess

## 2021-09-13 DIAGNOSIS — M6281 Muscle weakness (generalized): Secondary | ICD-10-CM | POA: Diagnosis not present

## 2021-09-13 DIAGNOSIS — R2689 Other abnormalities of gait and mobility: Secondary | ICD-10-CM | POA: Diagnosis not present

## 2021-09-13 DIAGNOSIS — R278 Other lack of coordination: Secondary | ICD-10-CM | POA: Diagnosis not present

## 2021-09-14 LAB — T4: T4, Total: 6.8 ug/dL (ref 4.5–12.0)

## 2021-09-16 DIAGNOSIS — M6281 Muscle weakness (generalized): Secondary | ICD-10-CM | POA: Diagnosis not present

## 2021-09-16 DIAGNOSIS — R2689 Other abnormalities of gait and mobility: Secondary | ICD-10-CM | POA: Diagnosis not present

## 2021-09-16 DIAGNOSIS — R278 Other lack of coordination: Secondary | ICD-10-CM | POA: Diagnosis not present

## 2021-09-17 ENCOUNTER — Ambulatory Visit (INDEPENDENT_AMBULATORY_CARE_PROVIDER_SITE_OTHER): Payer: Medicare Other | Admitting: Internal Medicine

## 2021-09-17 ENCOUNTER — Encounter: Payer: Self-pay | Admitting: Internal Medicine

## 2021-09-17 DIAGNOSIS — R413 Other amnesia: Secondary | ICD-10-CM | POA: Diagnosis not present

## 2021-09-17 DIAGNOSIS — M545 Low back pain, unspecified: Secondary | ICD-10-CM

## 2021-09-17 DIAGNOSIS — I4891 Unspecified atrial fibrillation: Secondary | ICD-10-CM | POA: Diagnosis not present

## 2021-09-17 DIAGNOSIS — L859 Epidermal thickening, unspecified: Secondary | ICD-10-CM | POA: Diagnosis not present

## 2021-09-17 DIAGNOSIS — N183 Chronic kidney disease, stage 3 unspecified: Secondary | ICD-10-CM | POA: Diagnosis not present

## 2021-09-17 MED ORDER — LIDOCAINE-PRILOCAINE 2.5-2.5 % EX CREA: 1.0000 | TOPICAL_CREAM | CUTANEOUS | 2 refills | Status: DC | PRN

## 2021-09-17 NOTE — Assessment & Plan Note (Signed)
Neuro-cognitive problems - chronic

## 2021-09-17 NOTE — Assessment & Plan Note (Signed)
Hydrate well ?Monitor GFR ?

## 2021-09-17 NOTE — Patient Instructions (Addendum)
Try GLYTONE exfoliating body lotion by Desmond Dike (free acid value 17.5) for rough areas on legs

## 2021-09-17 NOTE — Assessment & Plan Note (Signed)
Moving to Assisted living where her husband is

## 2021-09-17 NOTE — Progress Notes (Signed)
Subjective:  Patient ID: [name], female    DOB: 1925-10-27  Age: 88 y.o. MRN: [id number]  CC: Follow-up (C/o of increased tiredness)   HPI [name] presents for fatigue - worse, CHF, CHF, skin cancer. [name] is here with [name].  C/o episode of palpitations x15 min - resolved  Outpatient Medications Prior to Visit  Medication Sig Dispense Refill   acetaminophen (TYLENOL) 500 MG tablet Take 500 mg by mouth daily as needed for mild pain.      apixaban (ELIQUIS) 5 MG TABS tablet Take 1 tablet (5 mg total) by mouth 2 (two) times daily. NEEDS CARDIOLOGY APPOINTMENT FOR ELIQUIS REFILLS, PLEASE CALL OFFICE 60 tablet 0   B Complex-C (B-COMPLEX WITH VITAMIN C) tablet Take 1 tablet by mouth daily.     busPIRone (BUSPAR) 15 MG tablet Take 1 tablet (15 mg total) by mouth 3 (three) times daily. 90 tablet 5   CEMIPLIMAB-RWLC IV Inject 1 Dose into the vein every 21 ( twenty-one) days.     Cholecalciferol (VITAMIN D3) 1000 UNITS tablet Take 1,000 Units by mouth daily.     diltiazem (CARDIZEM CD) 120 MG 24 hr capsule TAKE 1 CAPSULE BY MOUTH ONCE DAILY 30 capsule 11   doxycycline (VIBRA-TABS) 100 MG tablet TAKE 1 TABLET BY MOUTH ONCE DAILY 30 tablet 11   famotidine (PEPCID) 20 MG tablet Take 20 mg by mouth daily.     furosemide (LASIX) 40 MG tablet Take 1 tablet (40 mg total) by mouth 2 (two) times daily. 60 tablet 5   gabapentin (NEURONTIN) 100 MG capsule TAKE 1 CAPSULE BY MOUTH EVERY NIGHT AT BEDTIME *DO NOT CRUSH OR CHEW* **NOTE NEW INSTRUCTIONS* 30 capsule 5   hydrocortisone 2.5 % lotion Apply topically 3 (three) times daily. 240 mL 2   levothyroxine (SYNTHROID) 25 MCG tablet TAKE 1 TABLET BY MOUTH ONCE DAILY FOR THYROID *TAKE ON AN EMPTY STOMACH* 30 tablet 11   metoprolol succinate (TOPROL-XL) 50 MG 24 hr tablet TAKE 1/2 TABLET = 25 MG BY MOUTH TWICE DAILY. TAKE WITH OR IMMEDIATELY FOLLOWING A MEAL 30 tablet 10   Multiple Vitamins-Minerals (MULTIVITAMIN WITH MINERALS) tablet Take 1 tablet by  mouth daily.     polyethylene glycol (MIRALAX) 17 g packet Take 17 g by mouth daily. 30 each 0   potassium chloride SA (KLOR-CON M) 20 MEQ tablet TAKE 1 TABLET BY MOUTH ONCE DAILY  ALONG W/ FUROSEMIDE AS NEED *TAKE WITH FOOD* *DO NOT CRUSH OR CHEW* *MAY DISSOLVE* 30 tablet 11   sertraline (ZOLOFT) 25 MG tablet Take 1 tablet (25 mg total) by mouth every morning. 30 tablet 5   temazepam (RESTORIL) 15 MG capsule TAKE 1 CAPSULE BY MOUTH AT BEDTIME AS NEEDED FOR SLEEP 30 capsule 5   colestipol (COLESTID) 1 g tablet Take 1 tablet (1 g total) by mouth in the morning. (Patient not taking: Reported on 09/17/2021) 60 tablet 12   loperamide (IMODIUM) 2 MG capsule Take 1 capsule (2 mg total) by mouth as needed for diarrhea or loose stools. Take 1 tab with first loose stool then one tab after each loose stool for maximum dose of 8 tabs daily. Pt to call the office if need for greater then 8 tabs a day (Patient not taking: Reported on 09/17/2021) 60 capsule 0   methylPREDNISolone (MEDROL DOSEPAK) 4 MG TBPK tablet As directed (Patient not taking: Reported on 09/17/2021) 21 tablet 0   prochlorperazine (COMPAZINE) 5 MG tablet Take 1 tablet (5 mg total) by  mouth every 8 (eight) hours as needed for nausea or vomiting. (Patient not taking: Reported on 09/17/2021) 20 tablet 1   No facility-administered medications prior to visit.    ROS: Review of Systems  Constitutional:  Positive for fatigue. Negative for activity change, appetite change, chills and unexpected weight change.  HENT:  Negative for congestion, mouth sores and sinus pressure.   Eyes:  Negative for visual disturbance.  Respiratory:  Negative for cough and chest tightness.   Gastrointestinal:  Negative for abdominal pain and nausea.  Genitourinary:  Negative for difficulty urinating, frequency and vaginal pain.  Musculoskeletal:  Positive for arthralgias, back pain, gait problem, myalgias and neck stiffness.  Skin:  Positive for color change and wound.  Negative for pallor and rash.  Neurological:  Negative for dizziness, tremors, weakness, numbness and headaches.  Psychiatric/Behavioral:  Positive for decreased concentration and dysphoric mood. Negative for confusion and sleep disturbance. The patient is not nervous/anxious.     Objective:  BP (!) 140/76 (BP Location: Left Arm)   Pulse 75   Temp 98.2 F (36.8 C) (Oral)   Ht '5\' 5"'$  (1.651 m)   Wt 150 lb 10.1 oz (68.3 kg)   SpO2 94%   BMI 25.07 kg/m   BP Readings from Last 3 Encounters:  09/17/21 (!) 140/76  09/12/21 114/60  09/12/21 133/79    Wt Readings from Last 3 Encounters:  09/17/21 150 lb 10.1 oz (68.3 kg)  08/22/21 152 lb 1.6 oz (69 kg)  08/05/21 155 lb (70.3 kg)    Physical Exam Constitutional:      General: She is not in acute distress.    Appearance: She is well-developed. She is obese.  HENT:     Head: Normocephalic.     Right Ear: External ear normal.     Left Ear: External ear normal.     Nose: Nose normal.  Eyes:     General:        Right eye: No discharge.        Left eye: No discharge.     Conjunctiva/sclera: Conjunctivae normal.     Pupils: Pupils are equal, round, and reactive to light.  Neck:     Thyroid: No thyromegaly.     Vascular: No JVD.     Trachea: No tracheal deviation.  Cardiovascular:     Rate and Rhythm: Normal rate and regular rhythm.     Heart sounds: Normal heart sounds.  Pulmonary:     Effort: No respiratory distress.     Breath sounds: No stridor. No wheezing.  Abdominal:     General: Bowel sounds are normal. There is no distension.     Palpations: Abdomen is soft. There is no mass.     Tenderness: There is abdominal tenderness. There is no guarding or rebound.  Musculoskeletal:        General: Tenderness present. No swelling.     Cervical back: Normal range of motion and neck supple. No rigidity.     Right lower leg: Edema present.     Left lower leg: Edema present.  Lymphadenopathy:     Cervical: No cervical  adenopathy.  Skin:    Findings: No erythema or rash.  Neurological:     Mental Status: She is oriented to person, place, and time.     Cranial Nerves: No cranial nerve deficit.     Motor: Weakness present. No abnormal muscle tone.     Coordination: Coordination normal.     Gait: Gait abnormal.  Deep Tendon Reflexes: Reflexes normal.  Psychiatric:        Behavior: Behavior normal.        Thought Content: Thought content normal.        Judgment: Judgment normal.   Legs are wrapped Hyperkeratotic buildup below knees In a w/c     A total time of 45 minutes was spent preparing to see the patient, reviewing tests, x-rays, operative reports and other medical records.  Also, obtaining history and performing comprehensive physical exam.  Additionally, counseling the patient regarding the above listed issues.   Finally, documenting clinical information in the health records, coordination of care, discussing issues with Eveline and dtr Pam. It is a complex case.   Lab Results  Component Value Date   WBC 5.0 09/12/2021   HGB 11.2 (L) 09/12/2021   HCT 34.3 (L) 09/12/2021   PLT 235 09/12/2021   GLUCOSE 139 (H) 09/12/2021   CHOL 116 06/25/2012   TRIG 82 09/05/2013   HDL 41.50 06/25/2012   LDLDIRECT 110.3 05/24/2008   LDLCALC 66 06/25/2012   ALT 10 09/12/2021   AST 25 09/12/2021   NA 137 09/12/2021   K 4.2 09/12/2021   CL 102 09/12/2021   CREATININE 0.97 09/12/2021   BUN 19 09/12/2021   CO2 30 09/12/2021   TSH 1.291 09/12/2021   INR 1.33 05/23/2017   HGBA1C 6.1 02/05/2021    VAS [place] LOWER EXTREMITY VENOUS REFLUX  Result Date: 03/25/2021  Lower Venous Reflux Study Patient Name:  FARRAN AMSDEN  Date of Exam:   03/25/2021 Medical Rec #: [id number]      Accession #:    [id number] Date of Birth: October 13, 1925     Patient Gender: F Patient Age:   69 years Exam Location:  [facility] Procedure:      VAS [place] LOWER EXTREMITY VENOUS REFLUX Referring Phys: JESSICA HOFFMAN  --------------------------------------------------------------------------------  Indications: Wounds.  Limitations: Difficulty with valsalva and pain with distal augmentation. Performing Technologist: Ralene Cork RVT  Examination Guidelines: A complete evaluation includes B-mode imaging, spectral Doppler, color Doppler, and power Doppler as needed of all accessible portions of each vessel. Bilateral testing is considered an integral part of a complete examination. Limited examinations for reoccurring indications may be performed as noted. The reflux portion of the exam is performed with the patient in reverse Trendelenburg. Significant venous reflux is defined as >500 ms in the superficial venous system, and >1 second in the deep venous system.  Venous Reflux Times +--------------+---------+------+-----------+------------+--------+ RIGHT         Reflux NoRefluxReflux TimeDiameter cmsComments                         Yes                                  +--------------+---------+------+-----------+------------+--------+ CFV                     yes   >1 second                      +--------------+---------+------+-----------+------------+--------+ FV prox       no                                             +--------------+---------+------+-----------+------------+--------+  FV mid        no                                             +--------------+---------+------+-----------+------------+--------+ FV dist       no                                             +--------------+---------+------+-----------+------------+--------+ Popliteal     no                                             +--------------+---------+------+-----------+------------+--------+ GSV at [facility]    no                           0.774             +--------------+---------+------+-----------+------------+--------+ GSV prox thigh          yes    >500 ms     0.661              +--------------+---------+------+-----------+------------+--------+ GSV mid thigh no                           0.712             +--------------+---------+------+-----------+------------+--------+ GSV dist thighno                           0.593             +--------------+---------+------+-----------+------------+--------+ GSV at knee   no                           0.546             +--------------+---------+------+-----------+------------+--------+ GSV prox calf no                           0.591             +--------------+---------+------+-----------+------------+--------+ SSV Pop Fossa           yes    >500 ms     0.411             +--------------+---------+------+-----------+------------+--------+ SSV prox calf           yes    >500 ms     0.431             +--------------+---------+------+-----------+------------+--------+ SSV mid calf                                        wound    +--------------+---------+------+-----------+------------+--------+  Summary: Right: - No evidence of deep vein thrombosis seen in the right lower extremity, from the common femoral through the popliteal veins. - No evidence of superficial venous thrombosis in the right lower extremity.  - Deep vein reflux in the CFV. - Superficial vein reflux in the SSV and proximal GSV. *See table(s) above for  measurements and observations. Electronically signed by Harold Barban MD on 03/25/2021 at 6:56:00 PM.    Final     Assessment & Plan:   Problem List Items Addressed This Visit     Atrial fibrillation ([place])    Rare PAF w/rapid HR Call RN if relapsed      CRI (chronic renal insufficiency), stage 3 (moderate) (HCC)    Hydrate well  Monitor GFR      Hyperkeratosis    Hyperkeratotic buildup below knees Try GLYTONE exfoliating body lotion by Desmond Dike (free acid value 17.5) if ok w/Wound Clinic      Low back pain    Moving to Assisted living where her husband is       Memory loss     Neuro-cognitive problems - chronic         Meds ordered this encounter  Medications   lidocaine-prilocaine (EMLA) cream    Sig: Apply 1 Application topically as needed. As directed 1 h before procedure    Dispense:  30 g    Refill:  2      Follow-up: Return in about 3 months (around 12/18/2021) for a follow-up visit.  Walker Kehr, MD

## 2021-09-17 NOTE — Assessment & Plan Note (Addendum)
Hyperkeratotic buildup below knees Try GLYTONE exfoliating body lotion by Desmond Dike (free acid value 17.5) if ok w/Wound Clinic

## 2021-09-17 NOTE — Assessment & Plan Note (Signed)
Rare PAF w/rapid HR Call RN if relapsed

## 2021-09-18 DIAGNOSIS — R2689 Other abnormalities of gait and mobility: Secondary | ICD-10-CM | POA: Diagnosis not present

## 2021-09-18 DIAGNOSIS — R278 Other lack of coordination: Secondary | ICD-10-CM | POA: Diagnosis not present

## 2021-09-18 DIAGNOSIS — M6281 Muscle weakness (generalized): Secondary | ICD-10-CM | POA: Diagnosis not present

## 2021-09-19 ENCOUNTER — Encounter (HOSPITAL_BASED_OUTPATIENT_CLINIC_OR_DEPARTMENT_OTHER): Payer: Medicare Other | Admitting: General Surgery

## 2021-09-19 ENCOUNTER — Other Ambulatory Visit: Payer: Self-pay | Admitting: Internal Medicine

## 2021-09-19 DIAGNOSIS — I87313 Chronic venous hypertension (idiopathic) with ulcer of bilateral lower extremity: Secondary | ICD-10-CM | POA: Diagnosis not present

## 2021-09-19 DIAGNOSIS — I890 Lymphedema, not elsewhere classified: Secondary | ICD-10-CM | POA: Diagnosis not present

## 2021-09-19 DIAGNOSIS — L97822 Non-pressure chronic ulcer of other part of left lower leg with fat layer exposed: Secondary | ICD-10-CM | POA: Diagnosis not present

## 2021-09-19 DIAGNOSIS — C44729 Squamous cell carcinoma of skin of left lower limb, including hip: Secondary | ICD-10-CM | POA: Diagnosis not present

## 2021-09-19 DIAGNOSIS — L97812 Non-pressure chronic ulcer of other part of right lower leg with fat layer exposed: Secondary | ICD-10-CM | POA: Diagnosis not present

## 2021-09-19 DIAGNOSIS — C44722 Squamous cell carcinoma of skin of right lower limb, including hip: Secondary | ICD-10-CM | POA: Diagnosis not present

## 2021-09-19 NOTE — Progress Notes (Signed)
[name] ([id number]) , Visit Report for 09/19/2021 SuperBill Details Patient Name: Date of Service: [name], [place]. 09/19/2021 Medical Record Number: [id number] Patient Account Number: [id number] Date of Birth/Sex: Treating RN: 06-Jan-1926 (88 y.o. [name], [name] Primary Care Provider: [name] Other Clinician: Referring Provider: Treating Provider/Extender: Kelby Aline in Treatment: 66 Diagnosis Coding ICD-10 Codes Code Description C44.92 Squamous cell carcinoma of skin, unspecified L97.812 Non-pressure chronic ulcer of other part of right lower leg with fat layer exposed L97.822 Non-pressure chronic ulcer of other part of left lower leg with fat layer exposed I89.0 Lymphedema, not elsewhere classified I87.313 Chronic venous hypertension (idiopathic) with ulcer of bilateral lower extremity Facility Procedures CPT4 Description Modifier Quantity Code [id number] [id number] BILATERAL: Application of multi-layer venous compression system; leg (below knee), including ankle and 1 foot. Electronic Signature(s) Signed: 09/19/2021 2:27:27 PM By: Fredirick Maudlin MD FACS Signed: 09/19/2021 3:25:17 PM By: Deon Pilling RN, BSN Entered By: Deon Pilling on 09/19/2021 13:48:51

## 2021-09-19 NOTE — Progress Notes (Signed)
[name] ([id number]) , Visit Report for 09/19/2021 Arrival Information Details Patient Name: Date of Service: [name], [place]. 09/19/2021 1:30 PM Medical Record Number: [id number] Patient Account Number: [id number] Date of Birth/Sex: Treating RN: [birth year] (88 y.o. [name], [name] Primary Care Dat Derksen: [name] Other Clinician: Referring Shyne Resch: Treating Evangelynn Lochridge/Extender: [name] in Treatment: 58 Visit Information History Since Last Visit Added or deleted any medications: No Patient Arrived: Wheel Chair Any new allergies or adverse reactions: No Arrival Time: 13:20 Had a fall or experienced change in No Accompanied By: daughter activities of daily living that may affect Transfer Assistance: Manual risk of falls: Patient Identification Verified: Yes Signs or symptoms of abuse/neglect since last visito No Secondary Verification Process Completed: Yes Hospitalized since last visit: No Patient Requires Transmission-Based No Implantable device outside of the clinic excluding No Precautions: cellular tissue based products placed in the center Patient Has Alerts: Yes since last visit: Patient Alerts: R ABI = Non compressible Has Dressing in Place as Prescribed: Yes L ABI = Non Has Compression in Place as Prescribed: Yes compressible Pain Present Now: No Notes per daughter home health required someone in the home to be taught how to apply dressings in case in the compression wraps slid down. Damarrion Mimbs made aware. Electronic Signature(s) Signed: 09/19/2021 3:25:17 PM By: Deon Pilling RN, BSN Entered By: Deon Pilling on 09/19/2021 13:47:10 -------------------------------------------------------------------------------- Compression Therapy Details Patient Name: Date of Service: [name], [place]. 09/19/2021 1:30 PM Medical Record Number: [id number] Patient Account Number: [id number] Date of Birth/Sex: Treating RN: 07/25/1925  (88 y.o. [name] Primary Care Azlan Hanway: [name] Other Clinician: Referring Jarret Torre: Treating Keenan Trefry/Extender: [name] in Treatment: 57 Compression Therapy Performed for Wound Assessment: Wound #20 Right,Circumferential Lower Leg Performed By: Clinician Deon Pilling, RN Compression Type: Three Hydrologist) Signed: 09/19/2021 3:25:17 PM By: Deon Pilling RN, BSN Entered By: Deon Pilling on 09/19/2021 13:47:36 -------------------------------------------------------------------------------- Compression Therapy Details Patient Name: Date of Service: [name], [place]. 09/19/2021 1:30 PM Medical Record Number: [id number] Patient Account Number: [id number] Date of Birth/Sex: Treating RN: [birth year] (88 y.o. [name] Primary Care Yarrow Linhart: [name] Other Clinician: Referring Blakeley Scheier: Treating Paisley Grajeda/Extender: [name] in Treatment: 64 Compression Therapy Performed for Wound Assessment: Wound #7RR Left,Circumferential Lower Leg Performed By: Clinician Deon Pilling, RN Compression Type: Three Hydrologist) Signed: 09/19/2021 3:25:17 PM By: Deon Pilling RN, BSN Entered By: Deon Pilling on 09/19/2021 13:47:36 -------------------------------------------------------------------------------- Encounter Discharge Information Details Patient Name: Date of Service: [name], [place]. 09/19/2021 1:30 PM Medical Record Number: [id number] Patient Account Number: [id number] Date of Birth/Sex: Treating RN: February 11, 1925 (88 y.o. [name] Primary Care [name]: [name] Other Clinician: Referring [name]: Treating [name]/Extender: [name] in Treatment: 51 Encounter Discharge Information Items Discharge Condition: Stable Ambulatory Status: Wheelchair Discharge Destination:  Home Transportation: Private Auto Accompanied By: daughter Schedule Follow-up Appointment: Yes Clinical Summary of Care: Electronic Signature(s) Signed: 09/19/2021 3:25:17 PM By: Deon Pilling RN, BSN Entered By: Deon Pilling on 09/19/2021 13:48:45 -------------------------------------------------------------------------------- Wound Assessment Details Patient Name: Date of Service: [name], [place]. 09/19/2021 1:30 PM Medical Record Number: [id number] Patient Account Number: [id number] Date of Birth/Sex: Treating RN: [birth year] (88 y.o. [name] Primary Care [name]: [name] Other Clinician: Referring [name]: Treating [name]/Extender: [name] in Treatment: 42 Wound Status Wound Number: 20 Primary Etiology: Lymphedema Wound  Location: Right, Circumferential Lower Leg Wound Status: Open Wounding Event: Gradually Appeared Date Acquired: 06/25/2021 Weeks Of Treatment: 12 Clustered Wound: Yes Wound Measurements Length: (cm) 1.6 Width: (cm) 0.7 Depth: (cm) 0.1 Area: (cm) 0.88 Volume: (cm) 0.088 % Reduction in Area: 99.8% % Reduction in Volume: 99.8% Wound Description Classification: Full Thickness Without Exposed Support Structu Exudate Amount: Medium Exudate Type: Serosanguineous Exudate Color: red, brown res Treatment Notes Wound #20 (Lower Leg) Wound Laterality: Right, Circumferential Cleanser Soap and Water Discharge Instruction: May shower and wash wound with dial antibacterial soap and water prior to dressing change. Wound Cleanser Discharge Instruction: Cleanse the wound with wound cleanser prior to applying a clean dressing using gauze sponges, not tissue or cotton balls. Peri-Wound Care cetaphil lotion Discharge Instruction: patient to provide to nurses to apply to both legs. Topical Primary Dressing Maxorb Extra Calcium Alginate Dressing, 4x4 in Discharge Instruction: Apply calcium alginate to  wound bed as instructed MediHoney Gel, tube 1.5 (oz) Discharge Instruction: Apply to wound bed as instructed Secondary Dressing ABD Pad, 8x10 Discharge Instruction: Apply over primary dressing as directed. Secured With Compression Wrap ThreePress (3 layer compression wrap) Discharge Instruction: Apply three layer compression as directed. ***PLEASE USE KERLIX INSTEAD OF COTTON.*** Compression Stockings Add-Ons Electronic Signature(s) Signed: 09/19/2021 3:25:17 PM By: Deon Pilling RN, BSN Entered By: Deon Pilling on 09/19/2021 13:47:23 -------------------------------------------------------------------------------- Wound Assessment Details Patient Name: Date of Service: [name], [place]. 09/19/2021 1:30 PM Medical Record Number: [id number] Patient Account Number: [id number] Date of Birth/Sex: Treating RN: Nov 07, 1925 (88 y.o. [name], [name] Primary Care Litsy Epting: [name] Other Clinician: Referring Riordan Walle: Treating Maxamillion Banas/Extender: [name] in Treatment: 60 Wound Status Wound Number: 7RR Primary Etiology: Malignant Wound Wound Location: Left, Circumferential Lower Leg Wound Status: Open Wounding Event: Blister Date Acquired: 04/20/2020 Weeks Of Treatment: 66 Clustered Wound: Yes Wound Measurements Length: (cm) 1.6 Width: (cm) 0.7 Depth: (cm) 0.1 Area: (cm) 0.88 Volume: (cm) 0.088 % Reduction in Area: 71.3% % Reduction in Volume: 71.3% Wound Description Classification: Full Thickness Without Exposed Support Structu Exudate Amount: Medium Exudate Type: Serosanguineous Exudate Color: red, brown res Treatment Notes Wound #7RR (Lower Leg) Wound Laterality: Left, Circumferential Cleanser Soap and Water Discharge Instruction: May shower and wash wound with dial antibacterial soap and water prior to dressing change. Wound Cleanser Discharge Instruction: Cleanse the wound with wound cleanser prior to applying a clean  dressing using gauze sponges, not tissue or cotton balls. Peri-Wound Care cetaphil lotion Discharge Instruction: patient to provide to nurses to apply to both legs. Topical Primary Dressing Maxorb Extra Calcium Alginate Dressing, 4x4 in Discharge Instruction: Apply calcium alginate to wound bed as instructed MediHoney Gel, tube 1.5 (oz) Discharge Instruction: Apply to wound bed as instructed Secondary Dressing ABD Pad, 8x10 Discharge Instruction: Apply over primary dressing as directed. Secured With Compression Wrap ThreePress (3 layer compression wrap) Discharge Instruction: Apply three layer compression as directed. ***PLEASE USE KERLIX INSTEAD OF COTTON.*** Compression Stockings Add-Ons Electronic Signature(s) Signed: 09/19/2021 3:25:17 PM By: Deon Pilling RN, BSN Entered By: Deon Pilling on 09/19/2021 13:47:23

## 2021-09-20 DIAGNOSIS — M6281 Muscle weakness (generalized): Secondary | ICD-10-CM | POA: Diagnosis not present

## 2021-09-20 DIAGNOSIS — I5030 Unspecified diastolic (congestive) heart failure: Secondary | ICD-10-CM | POA: Diagnosis not present

## 2021-09-20 DIAGNOSIS — R2689 Other abnormalities of gait and mobility: Secondary | ICD-10-CM | POA: Diagnosis not present

## 2021-09-23 DIAGNOSIS — R2689 Other abnormalities of gait and mobility: Secondary | ICD-10-CM | POA: Diagnosis not present

## 2021-09-23 DIAGNOSIS — I5030 Unspecified diastolic (congestive) heart failure: Secondary | ICD-10-CM | POA: Diagnosis not present

## 2021-09-23 DIAGNOSIS — M6281 Muscle weakness (generalized): Secondary | ICD-10-CM | POA: Diagnosis not present

## 2021-09-25 DIAGNOSIS — M6281 Muscle weakness (generalized): Secondary | ICD-10-CM | POA: Diagnosis not present

## 2021-09-25 DIAGNOSIS — R2689 Other abnormalities of gait and mobility: Secondary | ICD-10-CM | POA: Diagnosis not present

## 2021-09-25 DIAGNOSIS — I5030 Unspecified diastolic (congestive) heart failure: Secondary | ICD-10-CM | POA: Diagnosis not present

## 2021-09-26 ENCOUNTER — Encounter (HOSPITAL_BASED_OUTPATIENT_CLINIC_OR_DEPARTMENT_OTHER): Payer: Medicare Other | Attending: General Surgery | Admitting: Internal Medicine

## 2021-09-26 DIAGNOSIS — I87313 Chronic venous hypertension (idiopathic) with ulcer of bilateral lower extremity: Secondary | ICD-10-CM | POA: Insufficient documentation

## 2021-09-26 DIAGNOSIS — I890 Lymphedema, not elsewhere classified: Secondary | ICD-10-CM | POA: Diagnosis not present

## 2021-09-26 DIAGNOSIS — C4492 Squamous cell carcinoma of skin, unspecified: Secondary | ICD-10-CM | POA: Diagnosis not present

## 2021-09-26 DIAGNOSIS — L97822 Non-pressure chronic ulcer of other part of left lower leg with fat layer exposed: Secondary | ICD-10-CM | POA: Insufficient documentation

## 2021-09-26 DIAGNOSIS — L97812 Non-pressure chronic ulcer of other part of right lower leg with fat layer exposed: Secondary | ICD-10-CM | POA: Diagnosis not present

## 2021-09-26 NOTE — Progress Notes (Signed)
[name] ([id number]) , Visit Report for 09/26/2021 SuperBill Details Patient Name: Date of Service: [name], [name] 09/26/2021 Medical Record Number: [id number] Patient Account Number: [id number] Date of Birth/Sex: Treating RN: November 29, 1925 (88 y.o. [name], [name] Primary Care Provider: [name] Other Clinician: Referring Provider: Treating Provider/Extender: [name] in Treatment: 39 Diagnosis Coding ICD-10 Codes Code Description C44.92 Squamous cell carcinoma of skin, unspecified L97.812 Non-pressure chronic ulcer of other part of right lower leg with fat layer exposed L97.822 Non-pressure chronic ulcer of other part of left lower leg with fat layer exposed I89.0 Lymphedema, not elsewhere classified I87.313 Chronic venous hypertension (idiopathic) with ulcer of bilateral lower extremity Facility Procedures CPT4 Description Modifier Quantity Code [id number] BILATERAL: Application of multi-layer venous compression system; leg (below knee), including ankle and 1 foot. Electronic Signature(s) Signed: 09/26/2021 3:23:06 PM By: Kalman Shan DO Signed: 09/26/2021 4:40:53 PM By: Deon Pilling RN, BSN Entered By: Deon Pilling on 09/26/2021 14:04:25

## 2021-09-27 DIAGNOSIS — I5030 Unspecified diastolic (congestive) heart failure: Secondary | ICD-10-CM | POA: Diagnosis not present

## 2021-09-27 DIAGNOSIS — R2689 Other abnormalities of gait and mobility: Secondary | ICD-10-CM | POA: Diagnosis not present

## 2021-09-27 DIAGNOSIS — M6281 Muscle weakness (generalized): Secondary | ICD-10-CM | POA: Diagnosis not present

## 2021-10-01 ENCOUNTER — Other Ambulatory Visit: Payer: Self-pay | Admitting: Internal Medicine

## 2021-10-01 DIAGNOSIS — I5030 Unspecified diastolic (congestive) heart failure: Secondary | ICD-10-CM | POA: Diagnosis not present

## 2021-10-01 DIAGNOSIS — R2689 Other abnormalities of gait and mobility: Secondary | ICD-10-CM | POA: Diagnosis not present

## 2021-10-01 DIAGNOSIS — M6281 Muscle weakness (generalized): Secondary | ICD-10-CM | POA: Diagnosis not present

## 2021-10-02 ENCOUNTER — Encounter (HOSPITAL_BASED_OUTPATIENT_CLINIC_OR_DEPARTMENT_OTHER): Payer: Medicare Other | Admitting: Physician Assistant

## 2021-10-02 DIAGNOSIS — R2689 Other abnormalities of gait and mobility: Secondary | ICD-10-CM | POA: Diagnosis not present

## 2021-10-02 DIAGNOSIS — M6281 Muscle weakness (generalized): Secondary | ICD-10-CM | POA: Diagnosis not present

## 2021-10-02 DIAGNOSIS — C4492 Squamous cell carcinoma of skin, unspecified: Secondary | ICD-10-CM | POA: Diagnosis not present

## 2021-10-02 DIAGNOSIS — L97812 Non-pressure chronic ulcer of other part of right lower leg with fat layer exposed: Secondary | ICD-10-CM | POA: Diagnosis not present

## 2021-10-02 DIAGNOSIS — I87313 Chronic venous hypertension (idiopathic) with ulcer of bilateral lower extremity: Secondary | ICD-10-CM | POA: Diagnosis not present

## 2021-10-02 DIAGNOSIS — I5030 Unspecified diastolic (congestive) heart failure: Secondary | ICD-10-CM | POA: Diagnosis not present

## 2021-10-02 DIAGNOSIS — I890 Lymphedema, not elsewhere classified: Secondary | ICD-10-CM | POA: Diagnosis not present

## 2021-10-02 DIAGNOSIS — L97822 Non-pressure chronic ulcer of other part of left lower leg with fat layer exposed: Secondary | ICD-10-CM | POA: Diagnosis not present

## 2021-10-02 NOTE — Progress Notes (Signed)
LEONA ALEN ([id number]) , Visit Report for 10/02/2021 SuperBill Details Patient Name: Date of Service: [name], [place]. 10/02/2021 Medical Record Number: [id number] Patient Account Number: [id number] Date of Birth/Sex: Treating RN: 1925/11/28 (88 y.o. [name], [name] Primary Care Provider: [name] Other Clinician: Referring Provider: Treating Provider/Extender: Charlestine Massed in Treatment: 50 Diagnosis Coding ICD-10 Codes Code Description C44.92 Squamous cell carcinoma of skin, unspecified L97.812 Non-pressure chronic ulcer of other part of right lower leg with fat layer exposed L97.822 Non-pressure chronic ulcer of other part of left lower leg with fat layer exposed I89.0 Lymphedema, not elsewhere classified I87.313 Chronic venous hypertension (idiopathic) with ulcer of bilateral lower extremity Facility Procedures CPT4 Description Modifier Quantity Code 11021117 [id number] BILATERAL: Application of multi-layer venous compression system; leg (below knee), including ankle and 1 foot. Electronic Signature(s) Signed: 10/02/2021 5:46:43 PM By: Worthy Keeler PA-C Signed: 10/02/2021 5:59:54 PM By: Deon Pilling RN, BSN Entered By: Deon Pilling on 10/02/2021 14:15:29

## 2021-10-03 NOTE — Telephone Encounter (Signed)
Patient would like to speak to you about the zoloft.

## 2021-10-03 NOTE — Telephone Encounter (Signed)
Pt states since she started the zoloft she been having diarrhea. She states she stop the zoloft 09/26/21, but she is still having diarrhea.Marland KitchenJohny Chess

## 2021-10-03 NOTE — Progress Notes (Signed)
[name] ([id number]) , Visit Report for 10/02/2021 Arrival Information Details Patient Name: Date of Service: [name], [name] 10/02/2021 1:30 PM Medical Record Number: [id number] Patient Account Number: [id number] Date of Birth/Sex: Treating RN: May 27, 1925 (88 y.o. F) Primary Care [name]: [name] Other Clinician: Referring [name]: Treating [name]/Extender: [name] in Treatment: 28 Visit Information History Since Last Visit Added or deleted any medications: No Patient Arrived: Wheel Chair Any new allergies or adverse reactions: No Arrival Time: 13:33 Had a fall or experienced change in No Accompanied By: self activities of daily living that may affect Transfer Assistance: None risk of falls: Patient Identification Verified: Yes Signs or symptoms of abuse/neglect since last visito No Secondary Verification Process Completed: Yes Hospitalized since last visit: No Patient Requires Transmission-Based No Implantable device outside of the clinic excluding No Precautions: cellular tissue based products placed in the center Patient Has Alerts: Yes since last visit: Patient Alerts: R ABI = Non compressible Has Compression in Place as Prescribed: Yes L ABI = Non Pain Present Now: No compressible Electronic Signature(s) Signed: 10/03/2021 4:42:06 PM By: [name] Entered By: [name] on 10/02/2021 13:33:29 -------------------------------------------------------------------------------- Compression Therapy Details Patient Name: Date of Service: [name], [name] 10/02/2021 1:30 PM Medical Record Number: [id number] Patient Account Number: [id number] Date of Birth/Sex: Treating RN: 10-Dec-1925 (88 y.o. [name] Primary Care [name]: [name] Other Clinician: Referring [name]: Treating [name]/Extender: [name] in Treatment: 92 Compression Therapy Performed  for Wound Assessment: Wound #20 Right,Circumferential Lower Leg Performed By: Clinician [name] Pilling, RN Compression Type: Three Hydrologist) Signed: 10/02/2021 5:59:54 PM By: [name] Pilling RN, BSN Entered By: [name] on 10/02/2021 14:14:57 -------------------------------------------------------------------------------- Compression Therapy Details Patient Name: Date of Service: [name], [name] 10/02/2021 1:30 PM Medical Record Number: [id number] Patient Account Number: [id number] Date of Birth/Sex: Treating RN: 1925/12/24 (88 y.o. [name] Primary Care [name]: Other Clinician: [name] Referring [name]: Treating [name]/Extender: [name] in Treatment: 35 Compression Therapy Performed for Wound Assessment: Wound #7RR Left,Circumferential Lower Leg Performed By: Clinician [name] Pilling, RN Compression Type: Three Hydrologist) Signed: 10/02/2021 5:59:54 PM By: [name] Pilling RN, BSN Entered By: [name] on 10/02/2021 14:14:57 -------------------------------------------------------------------------------- Encounter Discharge Information Details Patient Name: Date of Service: [name], [name] 10/02/2021 1:30 PM Medical Record Number: [id number] Patient Account Number: [id number] Date of Birth/Sex: Treating RN: March 05, 1925 (88 y.o. [name] Primary Care [name]: [name] Other Clinician: Referring [name]: Treating [name]/Extender: [name], [name] in Treatment: 49 Encounter Discharge Information Items Discharge Condition: Stable Ambulatory Status: Wheelchair Discharge Destination: Home Transportation: Private Auto Accompanied By: self Schedule Follow-up Appointment: Yes Clinical Summary of Care: Patient Declined Electronic Signature(s) Signed: 10/02/2021 5:59:54 PM By: [name] Pilling RN, BSN Entered By: [name] on  10/02/2021 14:15:22 -------------------------------------------------------------------------------- Wound Assessment Details Patient Name: Date of Service: [name], [name] 10/02/2021 1:30 PM Medical Record Number: [id number] Patient Account Number: [id number] Date of Birth/Sex: Treating RN: 1926-01-07 (88 y.o. [name] Primary Care [name]: [name] Other Clinician: Referring [name]: Treating [name]/Extender: [name], [name] in Treatment: 3 Wound Status Wound Number: 20 Primary Etiology: Lymphedema Wound Location: Right, Circumferential Lower Leg Wound Status: Open Wounding Event: Gradually Appeared Date Acquired: 06/25/2021 Weeks Of Treatment: 13 Clustered Wound: Yes Wound Measurements Length: (cm) 1.6 Width: (cm) 0.7 Depth: (cm)  0.1 Area: (cm) 0.88 Volume: (cm) 0.088 Wound Description Classification: Full Thickness Without Exposed Support Structu Exudate Amount: Medium Exudate Type: Serosanguineous Exudate Color: red, brown res % Reduction in Area: 99.8% % Reduction in Volume: 99.8% Treatment Notes Wound #20 (Lower Leg) Wound Laterality: Right, Circumferential Cleanser Soap and Water Discharge Instruction: May shower and wash wound with dial antibacterial soap and water prior to dressing change. Wound Cleanser Discharge Instruction: Cleanse the wound with wound cleanser prior to applying a clean dressing using gauze sponges, not tissue or cotton balls. Peri-Wound Care cetaphil lotion Discharge Instruction: patient to provide to nurses to apply to both legs. Topical Primary Dressing Maxorb Extra Calcium Alginate Dressing, 4x4 in Discharge Instruction: Apply calcium alginate to wound bed as instructed MediHoney Gel, tube 1.5 (oz) Discharge Instruction: Apply to wound bed as instructed Secondary Dressing ABD Pad, 8x10 Discharge Instruction: Apply over primary dressing as directed. Secured With Compression  Wrap ThreePress (3 layer compression wrap) Discharge Instruction: Apply three layer compression as directed. ***PLEASE USE KERLIX INSTEAD OF COTTON.*** Compression Stockings Add-Ons Electronic Signature(s) Signed: 10/02/2021 5:59:54 PM By: [name] Pilling RN, BSN Entered By: [name] on 10/02/2021 14:13:48 -------------------------------------------------------------------------------- Wound Assessment Details Patient Name: Date of Service: [name], [name] 10/02/2021 1:30 PM Medical Record Number: [id number] Patient Account Number: [id number] Date of Birth/Sex: Treating RN: 1925-02-17 (88 y.o. [name], [name] Primary Care Jolon Degante: [name] Other Clinician: Referring Teyonna Plaisted: Treating Eveline Sauve/Extender: [name], [name] in Treatment: 66 Wound Status Wound Number: 7RR Primary Etiology: Malignant Wound Wound Location: Left, Circumferential Lower Leg Wound Status: Open Wounding Event: Blister Date Acquired: 04/20/2020 Weeks Of Treatment: 68 Clustered Wound: Yes Wound Measurements Length: (cm) 1.6 Width: (cm) 0.7 Depth: (cm) 0.1 Area: (cm) 0.88 Volume: (cm) 0.088 % Reduction in Area: 71.3% % Reduction in Volume: 71.3% Wound Description Classification: Full Thickness Without Exposed Support Stru Exudate Amount: Medium Exudate Type: Serosanguineous Exudate Color: red, brown ctures Treatment Notes Wound #7RR (Lower Leg) Wound Laterality: Left, Circumferential Cleanser Soap and Water Discharge Instruction: May shower and wash wound with dial antibacterial soap and water prior to dressing change. Wound Cleanser Discharge Instruction: Cleanse the wound with wound cleanser prior to applying a clean dressing using gauze sponges, not tissue or cotton balls. Peri-Wound Care cetaphil lotion Discharge Instruction: patient to provide to nurses to apply to both legs. Topical Primary Dressing Maxorb Extra Calcium Alginate Dressing, 4x4  in Discharge Instruction: Apply calcium alginate to wound bed as instructed MediHoney Gel, tube 1.5 (oz) Discharge Instruction: Apply to wound bed as instructed Secondary Dressing ABD Pad, 8x10 Discharge Instruction: Apply over primary dressing as directed. Secured With Compression Wrap ThreePress (3 layer compression wrap) Discharge Instruction: Apply three layer compression as directed. ***PLEASE USE KERLIX INSTEAD OF COTTON.*** Compression Stockings Add-Ons Electronic Signature(s) Signed: 10/02/2021 5:59:54 PM By: [name] Pilling RN, BSN Entered By: [name] on 10/02/2021 14:13:48 -------------------------------------------------------------------------------- Vitals Details Patient Name: Date of Service: [name], [place]. 10/02/2021 1:30 PM Medical Record Number: [id number] Patient Account Number: [id number] Date of Birth/Sex: Treating RN: [birth year] (88 y.o. F) Primary Care Raeonna Milo: [name] Other Clinician: Referring Nadene Witherspoon: Treating Ranon Coven/Extender: [name], [name] in Treatment: 35 Vital Signs Time Taken: 13:33 Temperature (F): 98.2 Height (in): 65 Pulse (bpm): 76 Weight (lbs): 163 Respiratory Rate (breaths/min): 18 Body Mass Index (BMI): 27.1 Blood Pressure (mmHg): 135/76 Reference Range: 80 - 120 mg / dl Electronic Signature(s) Signed: 10/03/2021 4:42:06 PM By: [name] Entered By: Barbarann Ehlers,  Joelene Millin on 10/02/2021 13:33:51

## 2021-10-04 ENCOUNTER — Inpatient Hospital Stay: Payer: Medicare Other

## 2021-10-04 ENCOUNTER — Inpatient Hospital Stay (HOSPITAL_BASED_OUTPATIENT_CLINIC_OR_DEPARTMENT_OTHER): Payer: Medicare Other | Admitting: Oncology

## 2021-10-04 ENCOUNTER — Other Ambulatory Visit: Payer: Self-pay

## 2021-10-04 ENCOUNTER — Inpatient Hospital Stay: Payer: Medicare Other | Attending: Oncology

## 2021-10-04 ENCOUNTER — Encounter: Payer: Self-pay | Admitting: Internal Medicine

## 2021-10-04 VITALS — BP 125/74 | HR 75 | Temp 98.0°F | Resp 18

## 2021-10-04 VITALS — BP 139/58 | HR 88 | Temp 97.8°F | Resp 18 | Ht 65.0 in | Wt 153.0 lb

## 2021-10-04 DIAGNOSIS — Z79899 Other long term (current) drug therapy: Secondary | ICD-10-CM | POA: Diagnosis not present

## 2021-10-04 DIAGNOSIS — M6281 Muscle weakness (generalized): Secondary | ICD-10-CM | POA: Diagnosis not present

## 2021-10-04 DIAGNOSIS — Z5112 Encounter for antineoplastic immunotherapy: Secondary | ICD-10-CM | POA: Insufficient documentation

## 2021-10-04 DIAGNOSIS — C44721 Squamous cell carcinoma of skin of unspecified lower limb, including hip: Secondary | ICD-10-CM | POA: Diagnosis not present

## 2021-10-04 DIAGNOSIS — I5030 Unspecified diastolic (congestive) heart failure: Secondary | ICD-10-CM | POA: Diagnosis not present

## 2021-10-04 DIAGNOSIS — C4490 Unspecified malignant neoplasm of skin, unspecified: Secondary | ICD-10-CM

## 2021-10-04 DIAGNOSIS — R2689 Other abnormalities of gait and mobility: Secondary | ICD-10-CM | POA: Diagnosis not present

## 2021-10-04 LAB — CMP (CANCER CENTER ONLY)
ALT: 15 U/L (ref 0–44)
AST: 32 U/L (ref 15–41)
Albumin: 3.4 g/dL — ABNORMAL LOW (ref 3.5–5.0)
Alkaline Phosphatase: 68 U/L (ref 38–126)
Anion gap: 7 (ref 5–15)
BUN: 19 mg/dL (ref 8–23)
CO2: 27 mmol/L (ref 22–32)
Calcium: 8.9 mg/dL (ref 8.9–10.3)
Chloride: 104 mmol/L (ref 98–111)
Creatinine: 0.97 mg/dL (ref 0.44–1.00)
GFR, Estimated: 54 mL/min — ABNORMAL LOW (ref >60)
Glucose, Bld: 153 mg/dL — ABNORMAL HIGH (ref 70–99)
Potassium: 4.1 mmol/L (ref 3.5–5.1)
Sodium: 138 mmol/L (ref 135–145)
Total Bilirubin: 0.7 mg/dL (ref 0.3–1.2)
Total Protein: 7.2 g/dL (ref 6.5–8.1)

## 2021-10-04 LAB — CBC WITH DIFFERENTIAL (CANCER CENTER ONLY)
Abs Immature Granulocytes: 0.01 10*3/uL (ref 0.00–0.07)
Basophils Absolute: 0.1 10*3/uL (ref 0.0–0.1)
Basophils Relative: 1 %
Eosinophils Absolute: 0.2 10*3/uL (ref 0.0–0.5)
Eosinophils Relative: 3 %
HCT: 33.2 % — ABNORMAL LOW (ref 36.0–46.0)
Hemoglobin: 10.6 g/dL — ABNORMAL LOW (ref 12.0–15.0)
Immature Granulocytes: 0 %
Lymphocytes Relative: 21 %
Lymphs Abs: 1.1 10*3/uL (ref 0.7–4.0)
MCH: 33.8 pg (ref 26.0–34.0)
MCHC: 31.9 g/dL (ref 30.0–36.0)
MCV: 105.7 fL — ABNORMAL HIGH (ref 80.0–100.0)
Monocytes Absolute: 0.7 10*3/uL (ref 0.1–1.0)
Monocytes Relative: 14 %
Neutro Abs: 3.1 10*3/uL (ref 1.7–7.7)
Neutrophils Relative %: 61 %
Platelet Count: 242 10*3/uL (ref 150–400)
RBC: 3.14 MIL/uL — ABNORMAL LOW (ref 3.87–5.11)
RDW: 14.1 % (ref 11.5–15.5)
WBC Count: 5.1 10*3/uL (ref 4.0–10.5)
nRBC: 0 % (ref 0.0–0.2)

## 2021-10-04 LAB — TSH: TSH: 0.909 u[IU]/mL (ref 0.350–4.500)

## 2021-10-04 MED ORDER — SODIUM CHLORIDE 0.9 % IV SOLN: Freq: Once | INTRAVENOUS | Status: AC

## 2021-10-04 MED ORDER — SODIUM CHLORIDE 0.9 % IV SOLN
350.0000 mg | Freq: Once | INTRAVENOUS | Status: AC
  Administered 2021-10-04: 350 mg via INTRAVENOUS
  Filled 2021-10-04: qty 7

## 2021-10-04 NOTE — Telephone Encounter (Signed)
Try Imodium A-D over-the-counter 1-2-3 times a day to stop the diarrhea. When diarrhea stops, Okay to try Zoloft again in 1 week.  Thank you

## 2021-10-04 NOTE — Progress Notes (Signed)
Hematology and Oncology Follow Up Visit  [name] [id number] 11-22-1925 88 y.o. 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  He initially presented in June 2022.  Prior Therapy:  She received Libtayo for 5 cycles concluded in September 2022.  Current therapy:  Libtayo 350 mg total dose restarted on March 26, 2021.  He is here for cycle 10 of therapy.  Interim History: [name] returns today for a follow-up.  Since her last visit, she reports no major changes in her health.  She has reported a few episodes of loose habits and diarrhea was attributed to Zoloft which she has discontinued.  She had 1 bowel movement today and feels no urge for any additional months.  She denies any abdominal pain or early satiety.  She is eating better and gained 2 pounds.    Medications: Updated. Current Outpatient Medications  Medication Sig Dispense Refill   acetaminophen (TYLENOL) 500 MG tablet Take 500 mg by mouth daily as needed for mild pain.      apixaban (ELIQUIS) 5 MG TABS tablet Take 1 tablet (5 mg total) by mouth 2 (two) times daily. NEEDS CARDIOLOGY APPOINTMENT FOR ELIQUIS REFILLS, PLEASE CALL OFFICE 60 tablet 0   B Complex-C (B-COMPLEX WITH VITAMIN C) tablet Take 1 tablet by mouth daily.     busPIRone (BUSPAR) 15 MG tablet TAKE 1 TABLET BY MOUTH THREE TIMES A DAY 90 tablet 3   CEMIPLIMAB-RWLC IV Inject 1 Dose into the vein every 21 ( twenty-one) days.     Cholecalciferol (VITAMIN D3) 1000 UNITS tablet Take 1,000 Units by mouth daily.     colestipol (COLESTID) 1 g tablet Take 1 tablet (1 g total) by mouth in the morning. (Patient not taking: Reported on 09/17/2021) 60 tablet 12   diltiazem (CARDIZEM CD) 120 MG 24 hr capsule TAKE 1 CAPSULE BY MOUTH ONCE DAILY 30 capsule 11   doxycycline (VIBRA-TABS) 100 MG tablet TAKE 1 TABLET BY MOUTH ONCE  DAILY 30 tablet 11   famotidine (PEPCID) 20 MG tablet Take 20 mg by mouth daily.     furosemide (LASIX) 40 MG tablet Take 1 tablet (40 mg total) by mouth 2 (two) times daily. 60 tablet 5   gabapentin (NEURONTIN) 100 MG capsule TAKE 1 CAPSULE BY MOUTH EVERY NIGHT AT BEDTIME *DO NOT CRUSH OR CHEW* **NOTE NEW INSTRUCTIONS* 30 capsule 5   hydrocortisone 2.5 % lotion Apply topically 3 (three) times daily. 240 mL 2   levothyroxine (SYNTHROID) 25 MCG tablet TAKE 1 TABLET BY MOUTH ONCE DAILY FOR THYROID *TAKE ON AN EMPTY STOMACH* 30 tablet 11   lidocaine-prilocaine (EMLA) cream Apply 1 Application topically as needed. As directed 1 h before procedure 30 g 2   metoprolol succinate (TOPROL-XL) 50 MG 24 hr tablet TAKE 1/2 TABLET = 25 MG BY MOUTH TWICE DAILY. TAKE WITH OR IMMEDIATELY FOLLOWING A MEAL 30 tablet 10   Multiple Vitamins-Minerals (MULTIVITAMIN WITH MINERALS) tablet Take 1 tablet by mouth daily.     polyethylene glycol (MIRALAX) 17 g packet Take 17 g by mouth daily. 30 each 0   potassium chloride SA (KLOR-CON M) 20 MEQ tablet TAKE 1 TABLET BY MOUTH ONCE DAILY  ALONG W/ FUROSEMIDE AS NEED *TAKE WITH FOOD* *DO NOT CRUSH OR CHEW* *MAY DISSOLVE* 30 tablet 11   sertraline (ZOLOFT) 25 MG tablet Take 1 tablet (25 mg total) by mouth  every morning. 30 tablet 5   temazepam (RESTORIL) 15 MG capsule TAKE 1 CAPSULE BY MOUTH AT BEDTIME AS NEEDED FOR SLEEP 30 capsule 5   No current facility-administered medications for this visit.     Allergies:  Allergies  Allergen Reactions   Zosyn [Piperacillin Sod-Tazobactam So] Hives, Itching, Rash and Other (See Comments)    Morbilliform eruptions with itching- looks like Measles   Atorvastatin     myalgias   Atenolol Nausea And Vomiting   Clarithromycin Nausea And Vomiting   Codeine Sulfate Nausea Only   Levaquin [Levofloxacin] Nausea Only   Macrodantin Other (See Comments)    UNSPECIFIED    Oxycodone-Acetaminophen Nausea And Vomiting      Physical  Exam:      Blood pressure (!) 139/58, pulse 88, temperature 97.8 F (36.6 C), temperature source Temporal, resp. rate 18, height '5\' 5"'$  (1.651 m), weight 153 lb (69.4 kg), SpO2 100 %.    ECOG: 2   General appearance: Alert, awake without any distress. Head: Atraumatic without abnormalities Oropharynx: Without any thrush or ulcers. Eyes: No scleral icterus. Lymph nodes: No lymphadenopathy noted in the cervical, supraclavicular, or axillary nodes Heart:regular rate and rhythm, without any murmurs or gallops.   Lung: Clear to auscultation without any rhonchi, wheezes or dullness to percussion. Abdomin: Soft, nontender without any shifting dullness or ascites. Musculoskeletal: No clubbing or cyanosis. Neurological: No motor or sensory deficits. Skin: Lower extremity erythema and raised scaly lesions noted.  These are unchanged from previous examination.       Lab Results: Lab Results  Component Value Date   WBC 5.1 10/04/2021   HGB 10.6 (L) 10/04/2021   HCT 33.2 (L) 10/04/2021   MCV 105.7 (H) 10/04/2021   PLT 242 10/04/2021     Chemistry      Component Value Date/Time   NA 137 09/12/2021 1208   NA 134 08/11/2018 1425   NA 137 07/28/2016 1334   K 4.2 09/12/2021 1208   K 3.8 07/28/2016 1334   CL 102 09/12/2021 1208   CO2 30 09/12/2021 1208   CO2 28 07/28/2016 1334   BUN 19 09/12/2021 1208   BUN 20 08/11/2018 1425   BUN 9.5 07/28/2016 1334   CREATININE 0.97 09/12/2021 1208   CREATININE 1.0 07/28/2016 1334      Component Value Date/Time   CALCIUM 9.4 09/12/2021 1208   CALCIUM 9.6 07/28/2016 1334   ALKPHOS 78 09/12/2021 1208   ALKPHOS 96 07/28/2016 1334   AST 25 09/12/2021 1208   AST 21 07/28/2016 1334   ALT 10 09/12/2021 1208   ALT 24 07/28/2016 1334   BILITOT 0.7 09/12/2021 1208   BILITOT 1.21 (H) 07/28/2016 1334         Impression and Plan:  88 year old with:  1.  Squamous cell carcinoma of the skin involving the lower extremity diagnosed in June  2022.  She has relapsed disease in 2023.    She is only on [place] which she has tolerated very well.  Risks and benefits of proceeding with treatment were discussed.  Plan to complete this cycle in the next cycle and discontinue after that.  Additional treatment could be considered in the future if she has relapsed disease.   2.  Breast cancer: She remains in remission without any evidence of disease relapse.  3.  Nonhealing wounds in the lower extremities: Continues to heal well and managed at the wound clinic.     4.  Follow-up: She will return in 3 weeks  for the next cycle of therapy.      30 minutes were dedicated to this encounter.  The time was spent on reviewing laboratory data, disease status update, treatment choices and complication related to cancer and cancer therapy.   Zola Button, MD 9/15/202311:41 AM

## 2021-10-04 NOTE — Patient Instructions (Signed)
[facility]COL[facility]  Discharge Instructions: Thank you for choosing [facility] to provide your oncology and hematology care.   If you have a lab appointment with the Cancer Center, please go directly to the Cancer Center and check in at the registration area.   Wear comfortable clothing and clothing appropriate for easy access to any Portacath or PICC line.   We strive to give you quality time with your provider. You may need to reschedule your appointment if you arrive late (15 or more minutes).  Arriving late affects you and other patients whose appointments are after yours.  Also, if you miss three or more appointments without notifying the office, you may be dismissed from the clinic at the provider's discretion.      For prescription refill requests, have your pharmacy contact our office and allow 72 hours for refills to be completed.    Today you received the following chemotherapy and/or immunotherapy agents: Libtayo      To help prevent nausea and vomiting after your treatment, we encourage you to take your nausea medication as directed.  BELOW ARE SYMPTOMS THAT SHOULD BE REPORTED IMMEDIATELY: *FEVER GREATER THAN 100.4 F (38 C) OR HIGHER *CHILLS OR SWEATING *NAUSEA AND VOMITING THAT IS NOT CONTROLLED WITH YOUR NAUSEA MEDICATION *UNUSUAL SHORTNESS OF BREATH *UNUSUAL BRUISING OR BLEEDING *URINARY PROBLEMS (pain or burning when urinating, or frequent urination) *BOWEL PROBLEMS (unusual diarrhea, constipation, pain near the anus) TENDERNESS IN MOUTH AND THROAT WITH OR WITHOUT PRESENCE OF ULCERS (sore throat, sores in mouth, or a toothache) UNUSUAL RASH, SWELLING OR PAIN  UNUSUAL VAGINAL DISCHARGE OR ITCHING   Items with * indicate a potential emergency and should be followed up as soon as possible or go to the Emergency Department if any problems should occur.  Please show the CHEMOTHERAPY ALERT CARD or IMMUNOTHERAPY ALERT CARD at check-in to  the Emergency Department and triage nurse.  Should you have questions after your visit or need to cancel or reschedule your appointment, please contact [facility]  Dept: [phone]  and follow the prompts.  Office hours are 8:00 a.m. to 4:30 p.m. Monday - Friday. Please note that voicemails left after 4:00 p.m. may not be returned until the following business day.  We are closed weekends and major holidays. You have access to a nurse at all times for urgent questions. Please call the main number to the clinic Dept: [phone] and follow the prompts.   For any non-urgent questions, you may also contact your provider using MyChart. We now offer e-Visits for anyone 18 and older to request care online for non-urgent symptoms. For details visit [email or web address].   Also download the MyChart app! Go to the app store, search "MyChart", open the app, select [facility], and log in with your MyChart username and password.  Masks are optional in the cancer centers. If you would like for your care team to wear a mask while they are taking care of you, please let them know. You may have one support person who is at least 86 years old accompany you for your appointments. 

## 2021-10-04 NOTE — Telephone Encounter (Signed)
Patient states that she is not going to take the zoloft - can he recommend any thing else.

## 2021-10-04 NOTE — Telephone Encounter (Signed)
Called pt no answer LMOM w/MD response../lmb 

## 2021-10-05 LAB — T4: T4, Total: 6.5 ug/dL (ref 4.5–12.0)

## 2021-10-07 ENCOUNTER — Encounter (HOSPITAL_BASED_OUTPATIENT_CLINIC_OR_DEPARTMENT_OTHER): Payer: Medicare Other | Admitting: Internal Medicine

## 2021-10-07 DIAGNOSIS — C4492 Squamous cell carcinoma of skin, unspecified: Secondary | ICD-10-CM

## 2021-10-07 DIAGNOSIS — L97822 Non-pressure chronic ulcer of other part of left lower leg with fat layer exposed: Secondary | ICD-10-CM

## 2021-10-07 DIAGNOSIS — L97812 Non-pressure chronic ulcer of other part of right lower leg with fat layer exposed: Secondary | ICD-10-CM | POA: Diagnosis not present

## 2021-10-07 DIAGNOSIS — I890 Lymphedema, not elsewhere classified: Secondary | ICD-10-CM | POA: Diagnosis not present

## 2021-10-07 DIAGNOSIS — I87313 Chronic venous hypertension (idiopathic) with ulcer of bilateral lower extremity: Secondary | ICD-10-CM | POA: Diagnosis not present

## 2021-10-07 NOTE — Progress Notes (Signed)
[name] ([id number]) , Visit Report for 10/07/2021 Arrival Information Details Patient Name: Date of Service: [name], [name]. 10/07/2021 1:30 PM Medical Record Number: [id number] Patient Account Number: [id number] Date of Birth/Sex: Treating RN: 07-04-1925 (88 y.o. [name], [name] Primary Care [name]: [name] Other Clinician: Referring [name]: Treating [name]/Extender: [name] in Bell: 64 Visit Information History Since Last Visit Added or deleted any medications: No Patient Arrived: Wheel Chair Any new allergies or adverse reactions: No Arrival Time: 13:25 Had a fall or experienced change in No Accompanied By: self activities of daily living that may affect Transfer Assistance: Manual risk of falls: Patient Identification Verified: Yes Signs or symptoms of abuse/neglect since last visito No Secondary Verification Process Completed: Yes Hospitalized since last visit: No Patient Requires Transmission-Based No Implantable device outside of the clinic excluding No Precautions: cellular tissue based products placed in the center Patient Has Alerts: Yes since last visit: Patient Alerts: R ABI = Non compressible Has Dressing in Place as Prescribed: Yes L ABI = Non compressible Has Compression in Place as Prescribed: Yes Pain Present Now: No Electronic Signature(s) Signed: 10/07/2021 4:49:15 PM By: [name] Pilling RN, BSN Entered By: [name] on 10/07/2021 13:25:27 -------------------------------------------------------------------------------- Compression Therapy Details Patient Name: Date of Service: [name], [name] 10/07/2021 1:30 PM Medical Record Number: [id number] Patient Account Number: [id number] Date of Birth/Sex: Treating RN: [birth year] (88 y.o. [name] Primary Care [name]: [name] Other Clinician: Referring [name]: Treating [name]/Extender: [name] in Bell: 87 Compression Therapy Performed for Wound Assessment: Wound #20 Bell,Circumferential Lower Leg Performed By: Clinician [name] Pilling, RN Compression Type: Three Layer Post Procedure Diagnosis Same as Pre-procedure Electronic Signature(s) Signed: 10/07/2021 4:49:15 PM By: [name] Pilling RN, BSN Entered By: [name] on 10/07/2021 13:38:05 -------------------------------------------------------------------------------- Compression Therapy Details Patient Name: Date of Service: [name], [name] 10/07/2021 1:30 PM Medical Record Number: [id number] Patient Account Number: [id number] Date of Birth/Sex: Treating RN: April 06, 1925 (88 y.o. [name] Primary Care Annell Canty: [name] Other Clinician: Referring [name]: Treating [name]/Extender: [name] in Bell: 10 Compression Therapy Performed for Wound Assessment: Wound #7RR Left,Circumferential Lower Leg Performed By: Clinician [name] Pilling, RN Compression Type: Three Layer Post Procedure Diagnosis Same as Pre-procedure Electronic Signature(s) Signed: 10/07/2021 4:49:15 PM By: [name] Pilling RN, BSN Entered By: [name] on 10/07/2021 13:38:05 -------------------------------------------------------------------------------- Encounter Discharge Information Details Patient Name: Date of Service: [name], [name] 10/07/2021 1:30 PM Medical Record Number: [id number] Patient Account Number: [id number] Date of Birth/Sex: Treating RN: 12/08/1925 (88 y.o. [name] Primary Care Kawana Hegel: [name] Other Clinician: Referring [name]: Treating [name]/Extender: [name] in Bell: 74 Encounter Discharge Information Items Discharge Condition: Stable Ambulatory Status: Wheelchair Discharge Destination: Home Transportation: Private Auto Accompanied By: self Schedule Follow-up  Appointment: Yes Clinical Summary of Care: Electronic Signature(s) Signed: 10/07/2021 4:49:15 PM By: [name] Pilling RN, BSN Entered By: [name] on 10/07/2021 13:41:48 -------------------------------------------------------------------------------- Lower Extremity Assessment Details Patient Name: Date of Service: [name], [name] 10/07/2021 1:30 PM Medical Record Number: [id number] Patient Account Number: [id number] Date of Birth/Sex: Treating RN: 06-Aug-1925 (88 y.o. [name] Primary Care [name]: [name] Other Clinician: Referring [name]: Treating  [name]/Extender: [name] in Bell: 70 Edema Assessment Assessed: [Left: Yes] [Bell: Yes] Edema: [Left: No] [Bell: No] Calf Left: Bell: Point of Measurement: 30 cm From Medial Instep 30.5  cm 30 cm Ankle Left: Bell: Point of Measurement: 9 cm From Medial Instep 22 cm 22 cm Vascular Assessment Pulses: Dorsalis Pedis Palpable: [Left:Yes] [Bell:Yes] Electronic Signature(s) Signed: 10/07/2021 4:49:15 PM By: [name] Pilling RN, BSN Entered By: [name] on 10/07/2021 13:28:59 -------------------------------------------------------------------------------- Multi Wound Chart Details Patient Name: Date of Service: [name], [name] 10/07/2021 1:30 PM Medical Record Number: [id number] Patient Account Number: [id number] Date of Birth/Sex: Treating RN: [birth year] (88 y.o. [name], [name] Primary Care [name]: [name] Other Clinician: Referring [name]: Treating [name]/Extender: [name] in Bell: 58 Vital Signs Height(in): 65 Pulse(bpm): 34 Weight(lbs): 163 Blood Pressure(mmHg): 119/67 Body Mass Index(BMI): 27.1 Temperature(F): 98.3 Respiratory Rate(breaths/min): 20 Photos: [N/A:N/A] Bell, Circumferential Lower Leg Left, Circumferential Lower Leg N/A Wound Location: Gradually Appeared Blister  N/A Wounding Event: Lymphedema Malignant Wound N/A Primary Etiology: Anemia, Arrhythmia, Congestive Heart Anemia, Arrhythmia, Congestive Heart N/A Comorbid History: Failure, Hypertension, Colitis, Failure, Hypertension, Colitis, Osteoarthritis Osteoarthritis 06/25/2021 04/20/2020 N/A Date Acquired: 14 69 N/A Weeks of Bell: Open Open N/A Wound Status: No Yes N/A Wound Recurrence: Yes Yes N/A Clustered Wound: 6 1 N/A Clustered Quantity: 16.5x9x0.1 1x1.5x0.1 N/A Measurements L x W x D (cm) 371.696 1.178 N/A A (cm) : rea 11.663 0.118 N/A Volume (cm) : 70.70% 61.60% N/A % Reduction in Area: 70.70% 61.60% N/A % Reduction in Volume: Full Thickness Without Exposed Full Thickness Without Exposed N/A Classification: Support Structures Support Structures Medium Medium N/A Exudate Amount: Serosanguineous Serosanguineous N/A Exudate Type: red, brown red, brown N/A Exudate Color: Distinct, outline attached Distinct, outline attached N/A Wound Margin: Medium (34-66%) Large (67-100%) N/A Granulation Amount: Red, Pink Red N/A Granulation Quality: Medium (34-66%) None Present (0%) N/A Necrotic Amount: Fat Layer (Subcutaneous Tissue): Yes Fat Layer (Subcutaneous Tissue): Yes N/A Exposed Structures: Fascia: No Fascia: No Tendon: No Tendon: No Muscle: No Muscle: No Joint: No Joint: No Bone: No Bone: No N/A Large (67-100%) N/A Epithelialization: Compression Therapy Compression Therapy N/A Procedures Performed: Bell Notes Wound #20 (Lower Leg) Wound Laterality: Bell, Circumferential Cleanser Soap and Water Discharge Instruction: May shower and wash wound with dial antibacterial soap and water prior to dressing change. Wound Cleanser Discharge Instruction: Cleanse the wound with wound cleanser prior to applying a clean dressing using gauze sponges, not tissue or cotton balls. Peri-Wound Care cetaphil lotion Discharge Instruction: patient to provide to nurses  to apply to both legs. Topical Primary Dressing Maxorb Extra Calcium Alginate Dressing, 4x4 in Discharge Instruction: Apply calcium alginate to wound bed as instructed Secondary Dressing ABD Pad, 8x10 Discharge Instruction: Apply over primary dressing as directed. Secured With Compression Wrap ThreePress (3 layer compression wrap) Discharge Instruction: Apply three layer compression as directed. ***PLEASE USE KERLIX INSTEAD OF COTTON.*** Compression Stockings Add-Ons Wound #7RR (Lower Leg) Wound Laterality: Left, Circumferential Cleanser Soap and Water Discharge Instruction: May shower and wash wound with dial antibacterial soap and water prior to dressing change. Wound Cleanser Discharge Instruction: Cleanse the wound with wound cleanser prior to applying a clean dressing using gauze sponges, not tissue or cotton balls. Peri-Wound Care cetaphil lotion Discharge Instruction: patient to provide to nurses to apply to both legs. Topical Gentamicin Discharge Instruction: As directed by physician Mupirocin Ointment Discharge Instruction: Apply Mupirocin (Bactroban) as instructed Primary Dressing Maxorb Extra Calcium Alginate Dressing, 4x4 in Discharge Instruction: Apply calcium alginate to wound bed as instructed Secondary Dressing ABD Pad, 8x10 Discharge Instruction: Apply over primary dressing as directed. Secured With Compression Wrap ThreePress (3 layer compression wrap) Discharge Instruction:  Apply three layer compression as directed. ***PLEASE USE KERLIX INSTEAD OF COTTON.*** Compression Stockings Add-Ons Electronic Signature(s) Signed: 10/07/2021 3:53:44 PM By: Kalman Shan DO Signed: 10/07/2021 4:49:15 PM By: [name] Pilling RN, BSN Entered By: Kalman Shan on 10/07/2021 13:45:57 -------------------------------------------------------------------------------- Multi-Disciplinary Care Plan Details Patient Name: Date of Service: [name], [place]. 10/07/2021 1:30  PM Medical Record Number: [id number] Patient Account Number: [id number] Date of Birth/Sex: Treating RN: 11/11/1925 (88 y.o. [name] Primary Care Ayane Delancey: [name] Other Clinician: Referring Mardi Cannady: Treating Treysen Sudbeck/Extender: [name] in Bell: 33 Multidisciplinary Care Plan reviewed with physician Active Inactive Venous Leg Ulcer Nursing Diagnoses: Knowledge deficit related to disease process and management Potential for venous Insuffiency (use before diagnosis confirmed) Goals: Patient will maintain optimal edema control Date Initiated: 11/01/2020 Target Resolution Date: 10/18/2021 Goal Status: Active Interventions: Assess peripheral edema status every visit. Compression as ordered Provide education on venous insufficiency Bell Activities: Therapeutic compression applied : 11/01/2020 Notes: 12/27/20: Edema control ongoing 01/24/21: Edema control continues, using 3 layer compression Wound/Skin Impairment Nursing Diagnoses: Impaired tissue integrity Knowledge deficit related to ulceration/compromised skin integrity Goals: Patient/caregiver will verbalize understanding of skin care regimen Date Initiated: 06/11/2020 Target Resolution Date: 10/18/2021 Goal Status: Active Interventions: Assess patient/caregiver ability to obtain necessary supplies Assess patient/caregiver ability to perform ulcer/skin care regimen upon admission and as needed Assess ulceration(s) every visit Provide education on ulcer and skin care Notes: 10/11/20: Wound care regimen ongoing. 12/27/20: Wound care continues Electronic Signature(s) Signed: 10/07/2021 4:49:15 PM By: [name] Pilling RN, BSN Entered By: [name] on 10/07/2021 13:38:20 -------------------------------------------------------------------------------- Pain Assessment Details Patient Name: Date of Service: [name], [place]. 10/07/2021 1:30 PM Medical Record Number:  [id number] Patient Account Number: [id number] Date of Birth/Sex: Treating RN: 08-28-1925 (88 y.o. [name] Primary Care Deyjah Kindel: [name] Other Clinician: Referring Raveena Hebdon: Treating Jakell Trusty/Extender: [name] in Bell: 50 Active Problems Location of Pain Severity and Description of Pain Patient Has Paino No Site Locations Rate the pain. Current Pain Level: 0 Pain Management and Medication Current Pain Management: Medication: No Cold Application: No Rest: No Massage: No Activity: No T.E.N.S.: No Heat Application: No Leg drop or elevation: No Is the Current Pain Management Adequate: Adequate How does your wound impact your activities of daily livingo Sleep: No Bathing: No Appetite: No Relationship With Others: No Bladder Continence: No Emotions: No Bowel Continence: No Work: No Toileting: No Drive: No Dressing: No Hobbies: No Engineer, maintenance) Signed: 10/07/2021 4:49:15 PM By: [name] Pilling RN, BSN Entered By: [name] on 10/07/2021 13:25:46 -------------------------------------------------------------------------------- Patient/Caregiver Education Details Patient Name: Date of Service: [name], Naiya A. 9/18/2023andnbsp1:30 PM Medical Record Number: [id number] Patient Account Number: [id number] Date of Birth/Gender: Treating RN: 03-May-1925 (88 y.o. [name] Primary Care Physician: [name] Other Clinician: Referring Physician: Treating Physician/Extender: [name] in Bell: 34 Education Assessment Education Provided To: Patient Education Topics Provided Venous: Handouts: Controlling Swelling with Multilayered Compression Wraps Methods: Explain/Verbal Responses: Reinforcements needed Electronic Signature(s) Signed: 10/07/2021 4:49:15 PM By: [name] Pilling RN, BSN Entered By: [name] on 10/07/2021  13:38:34 -------------------------------------------------------------------------------- Wound Assessment Details Patient Name: Date of Service: [name], [place]. 10/07/2021 1:30 PM Medical Record Number: [id number] Patient Account Number: [id number] Date of Birth/Sex: Treating RN: Aug 28, 1925 (88 y.o. [name] Primary Care Osiah Haring: [name] Other Clinician: Referring Atheena Spano: Treating Rockey Guarino/Extender: [name] in Bell: 60 Wound Status Wound Number:  20 Primary Lymphedema Etiology: Wound Location: Bell, Circumferential Lower Leg Wound Open Wounding Event: Gradually Appeared Status: Date Acquired: 06/25/2021 Comorbid Anemia, Arrhythmia, Congestive Heart Failure, Hypertension, Weeks Of Bell: 14 History: Colitis, Osteoarthritis Clustered Wound: Yes Photos Wound Measurements Length: (cm) 16.5 Width: (cm) 9 Depth: (cm) 0.1 Clustered Quantity: 6 Area: (cm) 116.632 Volume: (cm) 11.663 % Reduction in Area: 70.7% % Reduction in Volume: 70.7% Tunneling: No Undermining: No Wound Description Classification: Full Thickness Without Exposed Support Structures Wound Margin: Distinct, outline attached Exudate Amount: Medium Exudate Type: Serosanguineous Exudate Color: red, brown Foul Odor After Cleansing: No Slough/Fibrino Yes Wound Bed Granulation Amount: Medium (34-66%) Exposed Structure Granulation Quality: Red, Pink Fascia Exposed: No Necrotic Amount: Medium (34-66%) Fat Layer (Subcutaneous Tissue) Exposed: Yes Necrotic Quality: Adherent Slough Tendon Exposed: No Muscle Exposed: No Joint Exposed: No Bone Exposed: No Bell Notes Wound #20 (Lower Leg) Wound Laterality: Bell, Circumferential Cleanser Soap and Water Discharge Instruction: May shower and wash wound with dial antibacterial soap and water prior to dressing change. Wound Cleanser Discharge Instruction: Cleanse the wound with wound  cleanser prior to applying a clean dressing using gauze sponges, not tissue or cotton balls. Peri-Wound Care cetaphil lotion Discharge Instruction: patient to provide to nurses to apply to both legs. Topical Primary Dressing Maxorb Extra Calcium Alginate Dressing, 4x4 in Discharge Instruction: Apply calcium alginate to wound bed as instructed Secondary Dressing ABD Pad, 8x10 Discharge Instruction: Apply over primary dressing as directed. Secured With Compression Wrap ThreePress (3 layer compression wrap) Discharge Instruction: Apply three layer compression as directed. ***PLEASE USE KERLIX INSTEAD OF COTTON.*** Compression Stockings Add-Ons Electronic Signature(s) Signed: 10/07/2021 4:49:15 PM By: [name] Pilling RN, BSN Entered By: [name] on 10/07/2021 13:34:58 -------------------------------------------------------------------------------- Wound Assessment Details Patient Name: Date of Service: [name], [place]. 10/07/2021 1:30 PM Medical Record Number: [id number] Patient Account Number: [id number] Date of Birth/Sex: Treating RN: [birth year] (88 y.o. [name], [name] Primary Care Kanisha Duba: [name] Other Clinician: Referring Chaitanya Amedee: Treating Deari Sessler/Extender: [name] in Bell: 77 Wound Status Wound Number: 7RR Primary Malignant Wound Etiology: Wound Location: Left, Circumferential Lower Leg Wound Open Wounding Event: Blister Status: Date Acquired: 04/20/2020 Comorbid Anemia, Arrhythmia, Congestive Heart Failure, Hypertension, Weeks Of Bell: 69 History: Colitis, Osteoarthritis Clustered Wound: Yes Photos Wound Measurements Length: (cm) 1 Width: (cm) 1.5 Depth: (cm) 0.1 Clustered Quantity: 1 Area: (cm) 1.178 Volume: (cm) 0.118 % Reduction in Area: 61.6% % Reduction in Volume: 61.6% Epithelialization: Large (67-100%) Tunneling: No Undermining: No Wound Description Classification: Full Thickness  Without Exposed Support Structures Wound Margin: Distinct, outline attached Exudate Amount: Medium Exudate Type: Serosanguineous Exudate Color: red, brown Foul Odor After Cleansing: No Slough/Fibrino No Wound Bed Granulation Amount: Large (67-100%) Exposed Structure Granulation Quality: Red Fascia Exposed: No Necrotic Amount: None Present (0%) Fat Layer (Subcutaneous Tissue) Exposed: Yes Tendon Exposed: No Muscle Exposed: No Joint Exposed: No Bone Exposed: No Bell Notes Wound #7RR (Lower Leg) Wound Laterality: Left, Circumferential Cleanser Soap and Water Discharge Instruction: May shower and wash wound with dial antibacterial soap and water prior to dressing change. Wound Cleanser Discharge Instruction: Cleanse the wound with wound cleanser prior to applying a clean dressing using gauze sponges, not tissue or cotton balls. Peri-Wound Care cetaphil lotion Discharge Instruction: patient to provide to nurses to apply to both legs. Topical Gentamicin Discharge Instruction: As directed by physician Mupirocin Ointment Discharge Instruction: Apply Mupirocin (Bactroban) as instructed Primary Dressing Maxorb Extra Calcium Alginate Dressing, 4x4 in Discharge Instruction: Apply calcium alginate to  wound bed as instructed Secondary Dressing ABD Pad, 8x10 Discharge Instruction: Apply over primary dressing as directed. Secured With Compression Wrap ThreePress (3 layer compression wrap) Discharge Instruction: Apply three layer compression as directed. ***PLEASE USE KERLIX INSTEAD OF COTTON.*** Compression Stockings Add-Ons Electronic Signature(s) Signed: 10/07/2021 4:49:15 PM By: [name] Pilling RN, BSN Entered By: [name] on 10/07/2021 13:35:16 -------------------------------------------------------------------------------- Vitals Details Patient Name: Date of Service: [name], [place]. 10/07/2021 1:30 PM Medical Record Number: [id number] Patient Account Number:  [id number] Date of Birth/Sex: Treating RN: Nov 10, 1925 (88 y.o. [name], [name] Primary Care Samina Weekes: [name] Other Clinician: Referring Yer Castello: Treating Kinslie Hove/Extender: [name] in Bell: 19 Vital Signs Time Taken: 13:20 Temperature (F): 98.3 Height (in): 65 Pulse (bpm): 71 Weight (lbs): 163 Respiratory Rate (breaths/min): 20 Body Mass Index (BMI): 27.1 Blood Pressure (mmHg): 119/67 Reference Range: 80 - 120 mg / dl Electronic Signature(s) Signed: 10/07/2021 4:49:15 PM By: [name] Pilling RN, BSN Entered By: [name] on 10/07/2021 13:25:39

## 2021-10-07 NOTE — Telephone Encounter (Signed)
Noted.  We can try a low-dose Effexor.  Please send the prescription in.  I am not sure which pharmacy.

## 2021-10-07 NOTE — Progress Notes (Signed)
[name] ([id number]) , Visit Report for 10/07/2021 Chief Complaint Document Details Patient Name: Date of Service: [name], [place]. 10/07/2021 1:30 PM Medical Record Number: [id number] Patient Account Number: [id number] Date of Birth/Sex: Treating RN: Dec 17, 1925 (87 y.o. [name] Primary Care Provider: [name] Other Clinician: Referring Provider: Treating Provider/Extender: [name] in Treatment: 86 Information Obtained from: Patient Chief Complaint Bilateral lower extremity wounds that have been biopsied and positive for squamous cell carcinoma Bilateral lower extremity wounds due to chronic venous insufficiency/lymphedema Electronic Signature(s) Signed: 10/07/2021 3:53:44 PM By: Kalman Shan DO Entered By: Kalman Shan on 10/07/2021 13:46:03 -------------------------------------------------------------------------------- HPI Details Patient Name: Date of Service: [name], [place]. 10/07/2021 1:30 PM Medical Record Number: [id number] Patient Account Number: [id number] Date of Birth/Sex: Treating RN: 1925/02/17 (88 y.o. [name] Primary Care Provider: [name] Other Clinician: Referring Provider: Treating Provider/Extender: [name] in Treatment: 86 History of Present Illness Location: left leg HPI Description: Admission 5/23 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 She states that the wound on the right has been present for a year. The 1 on the left has opened up 1 month ago. She is followed with oncology for this issue as she had biopsies that showed squamous cell carcinoma. She is also seeing radiation oncology for treatment  options. She presents today because she would like for her wounds to be healed by [place]. She currently denies signs of infection. 6/1; patient presents for 1 week follow-up. She states she has tolerated the leg wraps well. She states these do not bother her and is happy to continue with them. She is scheduled to see her oncologist today to go over treatment options for the bilateral lower extremity squamous cell carcinoma. Radiation is currently not a recommended option. Patient states she overall feels well. 6/22; patient presents for 3-week follow-up. She has tolerated the wraps well until her last wrap where she states they were uncomfortable. She attributes this to the home health nurse. She denies signs of infection. She has started her first treatment of antibody infusions for her Bilateral lower extremity squamous cell carcinoma. She has no complaints today. 7/21; patient presents for 1 month follow-up. Unfortunately she has not had good experience with her wrap changes with home health. She would like to do her own dressing changes. She continues to do her antibody infusions. She denies signs of infection. 7/28; patient presents for 1 week follow-up. At last clinic visit she was switched to daily dressing changes due to issues with the wrap and home health placing them. Unfortunately she has developed weeping to her legs bilaterally. She would like to be placed in wraps today. She would also like to follow with [place] weekly for wrap changes instead of having home health change them. She denies signs of infection. 8/4; patient presents for 1 week follow-up. She has tolerated the Kerlix/Coban wraps well. She no longer has weeping to her legs. She took the wrap off 1 day before coming in to be able to take a shower. She has no issues or complaints today. She denies signs of infection. 8/18; patient presents for follow-up. Patient has tolerated the wraps well. She brought her Velcro compression wraps  today. She has no  issues or complaints today. She had her chemotherapy infusion yesterday without issues. She denies signs of infection. 8/25; patient presents for follow-up. She used her juxta lite compressions for the past week. It is unclear if she is able to put these on correctly since she states she has a hard time getting them to look right. She reports 2 open wounds. She currently denies signs of infection. 9/1; patient presents for 1 week follow-up. She has 1 open wound. She tolerated the compression wraps well. She currently denies signs of infection. 9/8; patient presents for 1 week follow-up. She has 2 open wounds 1 on each leg. She has tolerated the compression wrap well. She currently denies signs of infection. 9/15; patient presents for 1 week follow-up. She now has 3 wounds. 2 on the left and 1 on the right. She continues to tolerate the compression wrap well. She currently denies signs of infection. She has obtained furosemide by her primary care physician and would like to discuss when to take this. 9/22; patient presents for 1 week follow-up. She has scattered wounds on her lower extremities bilaterally. She did take furosemide twice in the past week. She does not recall having to urinate more frequently. She tolerated the 3 layer compression well. She denies signs of infection. 10/13; patient has not been here recently because of [place]. Apparently the facility was only putting gauze on her legs. This is a patient I do not normally see. 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 Ronnald Ramp at [facility] dermatology. She is quite convincing that she did not have radiation to her lower legs. It is likely she also has significant chronic venous insufficiency stasis dermatitis. We have been using silver alginate under kerlix Coban. She has obvious open areas medially on the right and areas on the left. She also has  areas of extensive dry flaking adherent areas on the right and to a lesser extent on the left anterior. Nodular areas on the left lateral lower leg left posterior calf and right mid calf medially. 10/21; patient presents for follow-up. She has no issues or complaints today. She has tolerated the 3 layer compression well bilaterally. She denies signs of infection. 10/27; patient presents for follow-up. She continues to tolerate the 3 layer compression wrap well. She denies signs of infection. 11/30; patient presents for follow-up. She has no issues or complaints today. 11/10; patient arrived in clinic today for nurse visit accompanied by her daughter from [place]. The daughter had multiple questions so we turned this into doctors visit. Apparently after the last time I saw this woman in October she went to see Dr. Ronnald Ramp but he did not take the wraps off. She previously was treated with Cemiplimab for her squamous cell carcinoma. She did not receive radiation to her legs. I had wanted Dr. Ronnald Ramp to look at this because of the exceptionally damaged skin on her lower legs bilaterally. She has odd looking wounds on the left medial lower leg also extending posteriorly which we have not made a lot of progress on. But she also has a raised hyperkeratotic nodules on the right anterior tibial area plaques on the left medial lower thigh. These are not areas that are under compression. We have been using silver alginate on any open areas Liberal TCA under 3 layer compression. 11/17; patient presents for follow-up. She had her wraps taken off yesterday for her dermatology appointment. She reports excessive  weeping to her legs bilaterally after the wraps were taken off. She currently denies signs of infection. 12/12/2020 upon evaluation today patient appears to be doing about the same in regard to her wounds. She was actually started on Cipro after having biopsies apparently at her dermatology clinic she does not  have the results back from the actual biopsy but the culture did return and apparently they placed her on the Cipro she started that just this morning. Other than that her legs appear to be doing okay at this time. 12/1; patient presents for follow-up. She has no issues or complaints today. She has started Lasix 40 mg daily to help with her bilateral leg swelling. 12/8; patient presents for follow-up. She has no issues or complaints today. 12/15; patient presents for 1 week follow-up. She has no issues or complaints today. 12/22; patient presents for follow-up. She has no issues or complaints today. 1/5; patient presents for follow-up. She has no issues or complaints today. She has tolerated the compression wraps well. 1/12; patient presents for follow-up. She is tolerated the compression wrap well and has no issues or complaints today. She denies signs of infection. 1/19; patient presents for follow-up. She took the compression wraps off 1 to 2 days ago to take a shower and did not have compression wraps replaced. She reports increased blistering throughout her legs bilaterally. She currently denies signs of infection. 1/26; patient presents for follow-up. She has no issues or complaints today. She tolerated the compression wraps well. She has not heard from oncology to schedule an appointment. 2/2; patient presents for follow-up. She has no issues or complaints today. She continues to tolerate the compression wrap well. 2/16; patient presents for follow-up. She has no issues or complaints today. 2/23; patient presents for follow-up. She has no issues or complaints today. Tomorrow she has an appointment with oncology to look at the suspicious lesion on her right lower extremity. She currently denies signs of infection. 3/2; patient presents for follow-up. She has been using her juxta light compression to the right lower extremity however there has been increased swelling and opening of wounds  throughout the legs with weeping. She currently denies signs of infection. She had no issues with the compression wrap to the left lower extremity. 3/16; patient presents for follow-up. She has no issues or complaints today. She states that she has been restarted on infusions for her squamous cell carcinoma of her legs. She has also been increased on Lasix to 40 mg twice daily. She has noticed a decrease in swelling to her legs. 3/23; patient presents for follow-up. She starts her second infusion next week for her squamous cell carcinoma lesions to her legs. She reports no issues with the compression wraps. 3/30; patient presents for follow-up. She has no issues or complaints today. 4/6; patient presents for follow-up. Her left lower extremity wounds have healed. She has her juxta light compression with her today. She has no issues or complaints today. 4/13; this is a patient we actually discharged to juxta lite stockings on the left leg the last time she was here. She came in on Monday for [place] to look at the leg and a nurse visit. She had massive swelling and numerous blisters and wound reopening. We put her back in 3 layer compression although I did not generate a note. Silver alginate on the wounded areas. We have also been doing the same on the right. In the meantime she is obtained her external compression pumps that were  previously ordered and she started to use them. She has skin cancers that are obvious on the right leg x2 her appointment with Dr. Jarome Matin of dermatology is on May 5.. She is also receiving weekly chemotherapy for recurrent presumably squamous cell carcinomas apparently has had 2 treatments 4/20; patient presents for follow-up. She has her new juxta lite compressions today. She continues to have open wounds to the left lower extremity. She has follow-up with dermatology in 2 weeks. She continues to have IV infusions for her squamous cell carcinoma lesions on her legs. 4/28;  the patient has bilateral lower extremity wounds predominantly on the right lateral upper leg, right anterior lower leg which in itself is probably a recurrent cancer as well as the left lateral lower leg. She has recurrent squamous cell carcinoma currently being treated with an IV infusion. She also has scattered nodules on her upper lower legs and thighs I am not certain of all of this is felt to be malignant as well We have been using silver alginate on the open areas wrapping her legs. She also has her external compression pumps at home but I am not clear that she is going to use these 5/2; patient presents for follow-up. She has not been taking her diuretics as prescribed. She sees Dr. Dian Situ, dermatology at the end of the week. She tolerated the compression wraps well today. She has her juxta lite compressions. 5/8; patient presents for follow-up. She saw Dr. Dian Situ, dermatology and she has nothing to report on. She has been using her juxta lite compression to the right lower extremity. She has tolerated the compression wrap well in the left lower extremity. She has not been taking her diuretic. She is scheduled to continue her infusions for her squamous cell carcinoma on her legs. 5/18; patient presents for follow-up. We wrapped her in 3 layer compression at last clinic visit. She has no issues or complaints today. 5/22; patient presents for follow-up. She has been wrapped in 3 layer compression bilaterally to her lower extremities over the past week. She has been scratching at the top of the wrap and picking at her skin. This area has increased warmth and erythema. 5/30; patient presents for follow-up. She completed Keflex with resolution of her symptoms to the right lower extremity. She no longer has increased warmth or erythema. She continues her infusions for her squamous cell carcinoma lesions. We continue to wrap her in 3 layer compression with silver alginate. She currently denies signs of  infection. 6/8; the patient went to see Dr. Ronnald Ramp of dermatology 2 days ago unfortunately we do not have his notes. They removed her compression wraps of course but did not adequately replace them. She comes in today with 3 wounds on the right lateral upper calf 1 medially below the obvious squamous cell carcinoma there is a large necrotic wound on the left lateral tibial area on the left and several blisters. The patient remains on Cemiplimad infusions for the squamous cell carcinoma bilaterally. She is not felt to be a good candidate for radiation. Some of the skin changes superiorly in the upper legs bilaterally according to Dr. Ronnald Ramp related to these infusions the same like fleshy nodules to me not blisters 6/13; patient presents for follow-up. We have been using silver alginate under 3 layer compression bilaterally. She has no issues or complaints today. 6/19; patient presents for follow-up. She will see her oncologist in 3 days and next week her dermatologist. She has no new complaints today.  We are using silver alginate under 3 layer compression bilaterally. 6/29; patient presents for follow-up. She she saw Dr. Darrin Nipper last week and States she received injections to the lesions on her legs. We will ask for office visit records. She could not be wrapped with compression and has more drainage on exam today. 7/6; patient presents for follow-up. She continues to receive injections to the lesions in her legs by her dermatologist. She had this done earlier today. She has no issues or complaints today. 7/13; patient presents for follow-up. She is following with Dr.Kavuri once weekly for treatment of her SCCs/KAs with IL5-FU. We have been wrapping her with silver alginate under Kerlix/Coban. She has no issues or complaints today. 7/20; patient presents for follow-up. She has elected to stop doing weekly treatments for her squamous cell carcinoma lesions. She states she experiences too much pain from  the injections. She is continuing her immunotherapy infusions. Next 1 is scheduled in August. T the legs we have been using silver alginate o under Kerlix/Coban. Her wounds actually look better today. 7/27; patient presents for follow-up. We have been using Medihoney and silver alginate under compression therapy. She has no issues or complaints today. 8/1; patient presents for follow-up. We have been using Medihoney and silver alginate under compression therapy. She reports no issues. She is scheduled for her infusion for her SCC later this week. 8/22; patient presents for follow-up. We have been using Medihoney and silver alginate under compression therapy. She reports no issues today. She has moved back home from the skilled nursing facility. She has home health that will be coming out to do OT and PT She would like for them to also do wound . care. 9/18; patient presents for follow-up. We have been using Medihoney and silver alginate under compression therapy. Home health is no longer coming out for wound care. She comes in weekly for wrap changes in our office During nurse visits. Electronic Signature(s) Signed: 10/07/2021 3:53:44 PM By: Kalman Shan DO Entered By: Kalman Shan on 10/07/2021 13:52:07 -------------------------------------------------------------------------------- Physical Exam Details Patient Name: Date of Service: [name], [place]. 10/07/2021 1:30 PM Medical Record Number: [id number] Patient Account Number: [id number] Date of Birth/Sex: Treating RN: June 26, 1925 (88 y.o. [name] Primary Care Provider: [name] Other Clinician: Referring Provider: Treating Provider/Extender: [name] in Treatment: 45 Constitutional respirations regular, non-labored and within target range for patient.. Cardiovascular 2+ dorsalis pedis/posterior tibialis pulses. Psychiatric pleasant and cooperative. Notes Few small  scattered open wounds to her lower extremities bilaterally limited to skin breakdown. 3 areas suspicious for malignancy. No surrounding signs of infection. Electronic Signature(s) Signed: 10/07/2021 3:53:44 PM By: Kalman Shan DO Entered By: Kalman Shan on 10/07/2021 13:48:37 -------------------------------------------------------------------------------- Physician Orders Details Patient Name: Date of Service: [name], [place]. 10/07/2021 1:30 PM Medical Record Number: [id number] Patient Account Number: [id number] Date of Birth/Sex: Treating RN: [birth year] (88 y.o. [name], [name] Primary Care Provider: [name] Other Clinician: Referring Provider: Treating Provider/Extender: [name] in Treatment: 5 Verbal / Phone Orders: No Diagnosis Coding ICD-10 Coding Code Description C44.92 Squamous cell carcinoma of skin, unspecified L97.812 Non-pressure chronic ulcer of other part of right lower leg with fat layer exposed L97.822 Non-pressure chronic ulcer of other part of left lower leg with fat layer exposed I89.0 Lymphedema, not elsewhere classified I87.313 Chronic venous hypertension (idiopathic) with ulcer of bilateral lower extremity Follow-up Appointments ppointment in 2 weeks. - Dr. Heber Geraldine and [name], Room  8 Return A Nurse Visit: - Next Monday Bathing/ Shower/ Hygiene May shower with protection but do not get wound dressing(s) wet. May shower and wash wound with soap and water. - with dressing changes may wash wounds with soap and water. Edema Control - Lymphedema / SCD / Other Lymphedema Pumps. Use Lymphedema pumps on leg(s) 2-3 times a day for 45-60 minutes. If wearing any wraps or hose, do not remove them. Continue exercising as instructed. - use one hour each time twice a day. Elevate legs to the level of the heart or above for 30 minutes daily and/or when sitting, a frequency of: - elevate the legs throughout the day heart  level if possible. Avoid standing for long periods of time. Exercise regularly Additional Orders / Instructions Follow Nutritious Diet Wound Treatment Wound #20 - Lower Leg Wound Laterality: Right, Circumferential Cleanser: Soap and Water 1 x Per Week/30 Days Discharge Instructions: May shower and wash wound with dial antibacterial soap and water prior to dressing change. Cleanser: Wound Cleanser 1 x Per Week/30 Days Discharge Instructions: Cleanse the wound with wound cleanser prior to applying a clean dressing using gauze sponges, not tissue or cotton balls. Peri-Wound Care: cetaphil lotion 1 x Per Week/30 Days Discharge Instructions: patient to provide to nurses to apply to both legs. Prim Dressing: Maxorb Extra Calcium Alginate Dressing, 4x4 in 1 x Per Week/30 Days ary Discharge Instructions: Apply calcium alginate to wound bed as instructed Secondary Dressing: ABD Pad, 8x10 1 x Per Week/30 Days Discharge Instructions: Apply over primary dressing as directed. Compression Wrap: ThreePress (3 layer compression wrap) 1 x Per Week/30 Days Discharge Instructions: Apply three layer compression as directed. ***PLEASE USE KERLIX INSTEAD OF COTTON.*** Wound #7RR - Lower Leg Wound Laterality: Left, Circumferential Cleanser: Soap and Water 1 x Per Week/30 Days Discharge Instructions: May shower and wash wound with dial antibacterial soap and water prior to dressing change. Cleanser: Wound Cleanser 1 x Per Week/30 Days Discharge Instructions: Cleanse the wound with wound cleanser prior to applying a clean dressing using gauze sponges, not tissue or cotton balls. Peri-Wound Care: cetaphil lotion 1 x Per Week/30 Days Discharge Instructions: patient to provide to nurses to apply to both legs. Topical: Gentamicin 1 x Per Week/30 Days Discharge Instructions: As directed by physician Topical: Mupirocin Ointment 1 x Per Week/30 Days Discharge Instructions: Apply Mupirocin (Bactroban) as  instructed Prim Dressing: Maxorb Extra Calcium Alginate Dressing, 4x4 in 1 x Per Week/30 Days ary Discharge Instructions: Apply calcium alginate to wound bed as instructed Secondary Dressing: ABD Pad, 8x10 1 x Per Week/30 Days Discharge Instructions: Apply over primary dressing as directed. Compression Wrap: ThreePress (3 layer compression wrap) 1 x Per Week/30 Days Discharge Instructions: Apply three layer compression as directed. ***PLEASE USE KERLIX INSTEAD OF COTTON.*** Electronic Signature(s) Signed: 10/07/2021 3:53:44 PM By: Kalman Shan DO Entered By: Kalman Shan on 10/07/2021 13:48:45 -------------------------------------------------------------------------------- Problem List Details Patient Name: Date of Service: [name], [place]. 10/07/2021 1:30 PM Medical Record Number: [id number] Patient Account Number: [id number] Date of Birth/Sex: Treating RN: Jan 25, 1925 (88 y.o. [name] Primary Care Provider: [name] Other Clinician: Referring Provider: Treating Provider/Extender: [name] in Treatment: 68 Active Problems ICD-10 Encounter Code Description Active Date MDM Diagnosis C44.92 Squamous cell carcinoma of skin, unspecified 06/11/2020 No Yes L97.812 Non-pressure chronic ulcer of other part of right lower leg with fat layer 06/11/2020 No Yes exposed L97.822 Non-pressure chronic ulcer of other part of left lower leg with fat  layer exposed5/23/2022 No Yes I89.0 Lymphedema, not elsewhere classified 01/31/2021 No Yes I87.313 Chronic venous hypertension (idiopathic) with ulcer of bilateral lower extremity 03/21/2021 No Yes Inactive Problems Resolved Problems Electronic Signature(s) Signed: 10/07/2021 3:53:44 PM By: Kalman Shan DO Entered By: Kalman Shan on 10/07/2021 13:45:50 -------------------------------------------------------------------------------- Progress Note Details Patient Name: Date of  Service: [name], [place]. 10/07/2021 1:30 PM Medical Record Number: [id number] Patient Account Number: [id number] Date of Birth/Sex: Treating RN: 03/16/1925 (88 y.o. [name], [name] Primary Care Provider: [name] Other Clinician: Referring Provider: Treating Provider/Extender: [name] in Treatment: 74 Subjective Chief Complaint Information obtained from Patient Bilateral lower extremity wounds that have been biopsied and positive for squamous cell carcinoma Bilateral lower extremity wounds due to chronic venous insufficiency/lymphedema History of Present Illness (HPI) The following HPI elements were documented for the patient's wound: Location: left leg Admission 5/23 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 She states that the wound on the right has been present for a year. The 1 on the left has opened up 1 month ago. She is followed with oncology for this issue as she had biopsies that showed squamous cell carcinoma. She is also seeing radiation oncology for treatment options. She presents today because she would like for her wounds to be healed by [place]. She currently denies signs of infection. 6/1; patient presents for 1 week follow-up. She states she has tolerated the leg wraps well. She states these do not bother her and is happy to continue with them. She is scheduled to see her oncologist today to go over treatment options for the bilateral lower extremity squamous cell carcinoma. Radiation is currently not a recommended option. Patient states she overall feels well. 6/22; patient presents for 3-week follow-up. She has tolerated the wraps well until her last wrap where she states they were uncomfortable.  She attributes this to the home health nurse. She denies signs of infection. She has started her first treatment of antibody infusions for her Bilateral lower extremity squamous cell carcinoma. She has no complaints today. 7/21; patient presents for 1 month follow-up. Unfortunately she has not had good experience with her wrap changes with home health. She would like to do her own dressing changes. She continues to do her antibody infusions. She denies signs of infection. 7/28; patient presents for 1 week follow-up. At last clinic visit she was switched to daily dressing changes due to issues with the wrap and home health placing them. Unfortunately she has developed weeping to her legs bilaterally. She would like to be placed in wraps today. She would also like to follow with [place] weekly for wrap changes instead of having home health change them. She denies signs of infection. 8/4; patient presents for 1 week follow-up. She has tolerated the Kerlix/Coban wraps well. She no longer has weeping to her legs. She took the wrap off 1 day before coming in to be able to take a shower. She has no issues or complaints today. She denies signs of infection. 8/18; patient presents for follow-up. Patient has tolerated the wraps well. She brought her Velcro compression wraps today. She has no issues or complaints today. She had her chemotherapy infusion yesterday without issues. She denies signs of infection. 8/25; patient presents for follow-up. She used  her juxta lite compressions for the past week. It is unclear if she is able to put these on correctly since she states she has a hard time getting them to look right. She reports 2 open wounds. She currently denies signs of infection. 9/1; patient presents for 1 week follow-up. She has 1 open wound. She tolerated the compression wraps well. She currently denies signs of infection. 9/8; patient presents for 1 week follow-up. She has 2 open wounds 1 on each leg. She  has tolerated the compression wrap well. She currently denies signs of infection. 9/15; patient presents for 1 week follow-up. She now has 3 wounds. 2 on the left and 1 on the right. She continues to tolerate the compression wrap well. She currently denies signs of infection. She has obtained furosemide by her primary care physician and would like to discuss when to take this. 9/22; patient presents for 1 week follow-up. She has scattered wounds on her lower extremities bilaterally. She did take furosemide twice in the past week. She does not recall having to urinate more frequently. She tolerated the 3 layer compression well. She denies signs of infection. 10/13; patient has not been here recently because of [place]. Apparently the facility was only putting gauze on her legs. This is a patient I do not normally see. 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 Ronnald Ramp at [facility] dermatology. She is quite convincing that she did not have radiation to her lower legs. It is likely she also has significant chronic venous insufficiency stasis dermatitis. We have been using silver alginate under kerlix Coban. She has obvious open areas medially on the right and areas on the left. She also has areas of extensive dry flaking adherent areas on the right and to a lesser extent on the left anterior. Nodular areas on the left lateral lower leg left posterior calf and right mid calf medially. 10/21; patient presents for follow-up. She has no issues or complaints today. She has tolerated the 3 layer compression well bilaterally. She denies signs of infection. 10/27; patient presents for follow-up. She continues to tolerate the 3 layer compression wrap well. She denies signs of infection. 11/30; patient presents for follow-up. She has no issues or complaints today. 11/10; patient arrived in clinic today for nurse visit accompanied by her  daughter from [place]. The daughter had multiple questions so we turned this into doctors visit. Apparently after the last time I saw this woman in October she went to see Dr. Ronnald Ramp but he did not take the wraps off. She previously was treated with Cemiplimab for her squamous cell carcinoma. She did not receive radiation to her legs. I had wanted Dr. Ronnald Ramp to look at this because of the exceptionally damaged skin on her lower legs bilaterally. She has odd looking wounds on the left medial lower leg also extending posteriorly which we have not made a lot of progress on. But she also has a raised hyperkeratotic nodules on the right anterior tibial area plaques on the left medial lower thigh. These are not areas that are under compression. We have been using silver alginate on any open areas Liberal TCA under 3 layer compression. 11/17; patient presents for follow-up. She had her wraps taken off yesterday for her dermatology appointment. She reports excessive weeping to her legs bilaterally after the wraps were taken off. She currently denies signs of infection. 12/12/2020 upon evaluation today patient appears  to be doing about the same in regard to her wounds. She was actually started on Cipro after having biopsies apparently at her dermatology clinic she does not have the results back from the actual biopsy but the culture did return and apparently they placed her on the Cipro she started that just this morning. Other than that her legs appear to be doing okay at this time. 12/1; patient presents for follow-up. She has no issues or complaints today. She has started Lasix 40 mg daily to help with her bilateral leg swelling. 12/8; patient presents for follow-up. She has no issues or complaints today. 12/15; patient presents for 1 week follow-up. She has no issues or complaints today. 12/22; patient presents for follow-up. She has no issues or complaints today. 1/5; patient presents for follow-up. She has  no issues or complaints today. She has tolerated the compression wraps well. 1/12; patient presents for follow-up. She is tolerated the compression wrap well and has no issues or complaints today. She denies signs of infection. 1/19; patient presents for follow-up. She took the compression wraps off 1 to 2 days ago to take a shower and did not have compression wraps replaced. She reports increased blistering throughout her legs bilaterally. She currently denies signs of infection. 1/26; patient presents for follow-up. She has no issues or complaints today. She tolerated the compression wraps well. She has not heard from oncology to schedule an appointment. 2/2; patient presents for follow-up. She has no issues or complaints today. She continues to tolerate the compression wrap well. 2/16; patient presents for follow-up. She has no issues or complaints today. 2/23; patient presents for follow-up. She has no issues or complaints today. Tomorrow she has an appointment with oncology to look at the suspicious lesion on her right lower extremity. She currently denies signs of infection. 3/2; patient presents for follow-up. She has been using her juxta light compression to the right lower extremity however there has been increased swelling and opening of wounds throughout the legs with weeping. She currently denies signs of infection. She had no issues with the compression wrap to the left lower extremity. 3/16; patient presents for follow-up. She has no issues or complaints today. She states that she has been restarted on infusions for her squamous cell carcinoma of her legs. She has also been increased on Lasix to 40 mg twice daily. She has noticed a decrease in swelling to her legs. 3/23; patient presents for follow-up. She starts her second infusion next week for her squamous cell carcinoma lesions to her legs. She reports no issues with the compression wraps. 3/30; patient presents for follow-up. She  has no issues or complaints today. 4/6; patient presents for follow-up. Her left lower extremity wounds have healed. She has her juxta light compression with her today. She has no issues or complaints today. 4/13; this is a patient we actually discharged to juxta lite stockings on the left leg the last time she was here. She came in on Monday for [place] to look at the leg and a nurse visit. She had massive swelling and numerous blisters and wound reopening. We put her back in 3 layer compression although I did not generate a note. Silver alginate on the wounded areas. We have also been doing the same on the right. In the meantime she is obtained her external compression pumps that were previously ordered and she started to use them. She has skin cancers that are obvious on the right leg x2 her appointment with  Dr. Jarome Matin of dermatology is on May 5.. She is also receiving weekly chemotherapy for recurrent presumably squamous cell carcinomas apparently has had 2 treatments 4/20; patient presents for follow-up. She has her new juxta lite compressions today. She continues to have open wounds to the left lower extremity. She has follow-up with dermatology in 2 weeks. She continues to have IV infusions for her squamous cell carcinoma lesions on her legs. 4/28; the patient has bilateral lower extremity wounds predominantly on the right lateral upper leg, right anterior lower leg which in itself is probably a recurrent cancer as well as the left lateral lower leg. She has recurrent squamous cell carcinoma currently being treated with an IV infusion. She also has scattered nodules on her upper lower legs and thighs I am not certain of all of this is felt to be malignant as well We have been using silver alginate on the open areas wrapping her legs. She also has her external compression pumps at home but I am not clear that she is going to use these 5/2; patient presents for follow-up. She has not been taking  her diuretics as prescribed. She sees Dr. Dian Situ, dermatology at the end of the week. She tolerated the compression wraps well today. She has her juxta lite compressions. 5/8; patient presents for follow-up. She saw Dr. Dian Situ, dermatology and she has nothing to report on. She has been using her juxta lite compression to the right lower extremity. She has tolerated the compression wrap well in the left lower extremity. She has not been taking her diuretic. She is scheduled to continue her infusions for her squamous cell carcinoma on her legs. 5/18; patient presents for follow-up. We wrapped her in 3 layer compression at last clinic visit. She has no issues or complaints today. 5/22; patient presents for follow-up. She has been wrapped in 3 layer compression bilaterally to her lower extremities over the past week. She has been scratching at the top of the wrap and picking at her skin. This area has increased warmth and erythema. 5/30; patient presents for follow-up. She completed Keflex with resolution of her symptoms to the right lower extremity. She no longer has increased warmth or erythema. She continues her infusions for her squamous cell carcinoma lesions. We continue to wrap her in 3 layer compression with silver alginate. She currently denies signs of infection. 6/8; the patient went to see Dr. Ronnald Ramp of dermatology 2 days ago unfortunately we do not have his notes. They removed her compression wraps of course but did not adequately replace them. She comes in today with 3 wounds on the right lateral upper calf 1 medially below the obvious squamous cell carcinoma there is a large necrotic wound on the left lateral tibial area on the left and several blisters. The patient remains on Cemiplimad infusions for the squamous cell carcinoma bilaterally. She is not felt to be a good candidate for radiation. Some of the skin changes superiorly in the upper legs bilaterally according to Dr. Ronnald Ramp related to  these infusions the same like fleshy nodules to me not blisters 6/13; patient presents for follow-up. We have been using silver alginate under 3 layer compression bilaterally. She has no issues or complaints today. 6/19; patient presents for follow-up. She will see her oncologist in 3 days and next week her dermatologist. She has no new complaints today. We are using silver alginate under 3 layer compression bilaterally. 6/29; patient presents for follow-up. She she saw Dr. Darrin Nipper last week and  States she received injections to the lesions on her legs. We will ask for office visit records. She could not be wrapped with compression and has more drainage on exam today. 7/6; patient presents for follow-up. She continues to receive injections to the lesions in her legs by her dermatologist. She had this done earlier today. She has no issues or complaints today. 7/13; patient presents for follow-up. She is following with Dr.Kavuri once weekly for treatment of her SCCs/KAs with [id number]. We have been wrapping her with silver alginate under Kerlix/Coban. She has no issues or complaints today. 7/20; patient presents for follow-up. She has elected to stop doing weekly treatments for her squamous cell carcinoma lesions. She states she experiences too much pain from the injections. She is continuing her immunotherapy infusions. Next 1 is scheduled in August. T the legs we have been using silver alginate o under Kerlix/Coban. Her wounds actually look better today. 7/27; patient presents for follow-up. We have been using Medihoney and silver alginate under compression therapy. She has no issues or complaints today. 8/1; patient presents for follow-up. We have been using Medihoney and silver alginate under compression therapy. She reports no issues. She is scheduled for her infusion for her SCC later this week. 8/22; patient presents for follow-up. We have been using Medihoney and silver alginate under  compression therapy. She reports no issues today. She has moved back home from the skilled nursing facility. She has home health that will be coming out to do OT and PT She would like for them to also do wound . care. 9/18; patient presents for follow-up. We have been using Medihoney and silver alginate under compression therapy. Home health is no longer coming out for wound care. She comes in weekly for wrap changes in our office During nurse visits. Patient History Information obtained from Patient. Family History Unknown History. Social History Never smoker, Marital Status - Single, Alcohol Use - Never, Drug Use - No History, Caffeine Use - Never. Medical History Eyes Denies history of Cataracts, Optic Neuritis Ear/Nose/Mouth/Throat Denies history of Chronic sinus problems/congestion, Middle ear problems Hematologic/Lymphatic Patient has history of Anemia Denies history of Hemophilia, Human Immunodeficiency Virus, Lymphedema, Sickle Cell Disease Respiratory Denies history of Aspiration, Asthma, Chronic Obstructive Pulmonary Disease (COPD), Pneumothorax, Sleep Apnea, Tuberculosis Cardiovascular Patient has history of Arrhythmia - Atrial Flutter, A fibb, Congestive Heart Failure, Hypertension Denies history of Angina, Coronary Artery Disease, Deep Vein Thrombosis, Hypotension, Myocardial Infarction, Peripheral Arterial Disease, Peripheral Venous Disease, Phlebitis, Vasculitis Gastrointestinal Patient has history of Colitis Denies history of Cirrhosis , Crohnoos, Hepatitis A, Hepatitis B, Hepatitis C Endocrine Denies history of Type I Diabetes, Type II Diabetes Genitourinary Denies history of End Stage Renal Disease Immunological Denies history of Lupus Erythematosus, Raynaudoos, Scleroderma Integumentary (Skin) Denies history of History of Burn Musculoskeletal Patient has history of Osteoarthritis Denies history of Gout, Rheumatoid Arthritis,  Osteomyelitis Neurologic Denies history of Dementia, Neuropathy, Quadriplegia, Paraplegia, Seizure Disorder Oncologic Denies history of Received Chemotherapy, Received Radiation Hospitalization/Surgery History - removal of rod left leg. 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 Vitals Time Taken: 1:20 PM, Height: 65 in, Weight: 163 lbs, BMI: 27.1, Temperature: 98.3 F, Pulse: 71 bpm, Respiratory Rate: 20 breaths/min, Blood Pressure: 119/67 mmHg. Cardiovascular 2+ dorsalis pedis/posterior tibialis pulses. Psychiatric pleasant and cooperative. General Notes: Few small scattered open wounds to her lower extremities bilaterally limited to skin  breakdown. 3 areas suspicious for malignancy. No surrounding signs of infection. Integumentary (Hair, Skin) Wound #20 status is Open. Original cause of wound was Gradually Appeared. The date acquired was: 06/25/2021. The wound has been in treatment 14 weeks. The wound is located on the Right,Circumferential Lower Leg. The wound measures 16.5cm length x 9cm width x 0.1cm depth; 116.632cm^2 area and 11.663cm^3 volume. There is Fat Layer (Subcutaneous Tissue) exposed. There is no tunneling or undermining noted. There is a medium amount of serosanguineous drainage noted. The wound margin is distinct with the outline attached to the wound base. There is medium (34-66%) red, pink granulation within the wound bed. There is a medium (34-66%) amount of necrotic tissue within the wound bed including Adherent Slough. Wound #7RR status is Open. Original cause of wound was Blister. The date acquired was: 04/20/2020. The wound has been in treatment 69 weeks. The wound is located on the Left,Circumferential Lower Leg. The wound measures 1cm length x  1.5cm width x 0.1cm depth; 1.178cm^2 area and 0.118cm^3 volume. There is Fat Layer (Subcutaneous Tissue) exposed. There is no tunneling or undermining noted. There is a medium amount of serosanguineous drainage noted. The wound margin is distinct with the outline attached to the wound base. There is large (67-100%) red granulation within the wound bed. There is no necrotic tissue within the wound bed. Assessment Active Problems ICD-10 Squamous cell carcinoma of skin, unspecified Non-pressure chronic ulcer of other part of right lower leg with fat layer exposed Non-pressure chronic ulcer of other part of left lower leg with fat layer exposed Lymphedema, not elsewhere classified Chronic venous hypertension (idiopathic) with ulcer of bilateral lower extremity Patient's wounds are stable. The Medihoney/silver alginate dressing was sticking to the wound beds. I recommend at this time stopping Medihoney and just using regular calcium alginate. Continue 3-layer compression. Procedures Wound #20 Pre-procedure diagnosis of Wound #20 is a Lymphedema located on the Right,Circumferential Lower Leg . There was a Three Layer Compression Therapy Procedure by Deon Pilling, RN. Post procedure Diagnosis Wound #20: Same as Pre-Procedure Wound #7RR Pre-procedure diagnosis of Wound #7RR is a Malignant Wound located on the Left,Circumferential Lower Leg . There was a Three Layer Compression Therapy Procedure by Deon Pilling, RN. Post procedure Diagnosis Wound #7RR: Same as Pre-Procedure Plan Follow-up Appointments: Return Appointment in 2 weeks. - Dr. Heber Donnellson and [name], Room 8 Nurse Visit: - Next Monday Bathing/ Shower/ Hygiene: May shower with protection but do not get wound dressing(s) wet. May shower and wash wound with soap and water. - with dressing changes may wash wounds with soap and water. Edema Control - Lymphedema / SCD / Other: Lymphedema Pumps. Use Lymphedema pumps on leg(s) 2-3 times a day  for 45-60 minutes. If wearing any wraps or hose, do not remove them. Continue exercising as instructed. - use one hour each time twice a day. Elevate legs to the level of the heart or above for 30 minutes daily and/or when sitting, a frequency of: - elevate the legs throughout the day heart level if possible. Avoid standing for long periods of time. Exercise regularly Additional Orders / Instructions: Follow Nutritious Diet WOUND #20: - Lower Leg Wound Laterality: Right, Circumferential Cleanser: Soap and Water 1 x Per Week/30 Days Discharge Instructions: May shower and wash wound with dial antibacterial soap and water prior to dressing change. Cleanser: Wound Cleanser 1 x Per Week/30 Days Discharge Instructions: Cleanse the wound with wound cleanser prior to applying a clean dressing using gauze sponges, not tissue or  cotton balls. Peri-Wound Care: cetaphil lotion 1 x Per Week/30 Days Discharge Instructions: patient to provide to nurses to apply to both legs. Prim Dressing: Maxorb Extra Calcium Alginate Dressing, 4x4 in 1 x Per Week/30 Days ary Discharge Instructions: Apply calcium alginate to wound bed as instructed Secondary Dressing: ABD Pad, 8x10 1 x Per Week/30 Days Discharge Instructions: Apply over primary dressing as directed. Com pression Wrap: ThreePress (3 layer compression wrap) 1 x Per Week/30 Days Discharge Instructions: Apply three layer compression as directed. ***PLEASE USE KERLIX INSTEAD OF COTTON.*** WOUND #7RR: - Lower Leg Wound Laterality: Left, Circumferential Cleanser: Soap and Water 1 x Per Week/30 Days Discharge Instructions: May shower and wash wound with dial antibacterial soap and water prior to dressing change. Cleanser: Wound Cleanser 1 x Per Week/30 Days Discharge Instructions: Cleanse the wound with wound cleanser prior to applying a clean dressing using gauze sponges, not tissue or cotton balls. Peri-Wound Care: cetaphil lotion 1 x Per Week/30  Days Discharge Instructions: patient to provide to nurses to apply to both legs. Topical: Gentamicin 1 x Per Week/30 Days Discharge Instructions: As directed by physician Topical: Mupirocin Ointment 1 x Per Week/30 Days Discharge Instructions: Apply Mupirocin (Bactroban) as instructed Prim Dressing: Maxorb Extra Calcium Alginate Dressing, 4x4 in 1 x Per Week/30 Days ary Discharge Instructions: Apply calcium alginate to wound bed as instructed Secondary Dressing: ABD Pad, 8x10 1 x Per Week/30 Days Discharge Instructions: Apply over primary dressing as directed. Com pression Wrap: ThreePress (3 layer compression wrap) 1 x Per Week/30 Days Discharge Instructions: Apply three layer compression as directed. ***PLEASE USE KERLIX INSTEAD OF COTTON.*** 1. Calcium alginate under 3 layer compression Electronic Signature(s) Signed: 10/07/2021 3:53:44 PM By: Kalman Shan DO Entered By: Kalman Shan on 10/07/2021 13:52:19 -------------------------------------------------------------------------------- HxROS Details Patient Name: Date of Service: [name], [place]. 10/07/2021 1:30 PM Medical Record Number: [id number] Patient Account Number: [id number] Date of Birth/Sex: Treating RN: 23-Aug-1925 (88 y.o. [name] Primary Care Provider: [name] Other Clinician: Referring Provider: Treating Provider/Extender: [name] in Treatment: 44 Information Obtained From Patient Eyes Medical History: Negative for: Cataracts; Optic Neuritis Ear/Nose/Mouth/Throat Medical History: Negative for: Chronic sinus problems/congestion; Middle ear problems Hematologic/Lymphatic Medical History: Positive for: Anemia Negative for: Hemophilia; Human Immunodeficiency Virus; Lymphedema; Sickle Cell Disease Respiratory Medical History: Negative for: Aspiration; Asthma; Chronic Obstructive Pulmonary Disease (COPD); Pneumothorax; Sleep Apnea;  Tuberculosis Cardiovascular Medical History: Positive for: Arrhythmia - Atrial Flutter, A fibb; Congestive Heart Failure; Hypertension Negative for: Angina; Coronary Artery Disease; Deep Vein Thrombosis; Hypotension; Myocardial Infarction; Peripheral Arterial Disease; Peripheral Venous Disease; Phlebitis; Vasculitis Past Medical History Notes: hyperlipidemia Gastrointestinal Medical History: Positive for: Colitis Negative for: Cirrhosis ; Crohns; Hepatitis A; Hepatitis B; Hepatitis C Endocrine Medical History: Negative for: Type I Diabetes; Type II Diabetes Past Medical History Notes: hypothyroidism Genitourinary Medical History: Negative for: End Stage Renal Disease Immunological Medical History: Negative for: Lupus Erythematosus; Raynauds; Scleroderma Integumentary (Skin) Medical History: Negative for: History of Burn Musculoskeletal Medical History: Positive for: Osteoarthritis Negative for: Gout; Rheumatoid Arthritis; Osteomyelitis Neurologic Medical History: Negative for: Dementia; Neuropathy; Quadriplegia; Paraplegia; Seizure Disorder Past Medical History Notes: lumbar spindylolysis Oncologic Medical History: Negative for: Received Chemotherapy; Received Radiation Past Medical History Notes: BLE squamous ceel carcionoma Immunizations Pneumococcal Vaccine: Received Pneumococcal Vaccination: Yes Received Pneumococcal Vaccination On or After 60th Birthday: No Implantable Devices None Hospitalization / Surgery History Type of Hospitalization/Surgery removal of rod left leg Family and Social History Unknown History: Yes; Never smoker; Marital  Status - Single; Alcohol Use: Never; Drug Use: No History; Caffeine Use: Never; Financial Concerns: No; Food, Clothing or Shelter Needs: No; Support System Lacking: No; Transportation Concerns: No Electronic Signature(s) Signed: 10/07/2021 3:53:44 PM By: Kalman Shan DO Signed: 10/07/2021 4:49:15 PM By: Deon Pilling RN,  BSN Entered By: Kalman Shan on 10/07/2021 13:48:10 -------------------------------------------------------------------------------- SuperBill Details Patient Name: Date of Service: [name], [place]. 10/07/2021 Medical Record Number: [id number] Patient Account Number: [id number] Date of Birth/Sex: Treating RN: 17-Jul-1925 (88 y.o. [name], [name] Primary Care Provider: [name] Other Clinician: Referring Provider: Treating Provider/Extender: [name] in Treatment: 50 Diagnosis Coding ICD-10 Codes Code Description C44.92 Squamous cell carcinoma of skin, unspecified L97.812 Non-pressure chronic ulcer of other part of right lower leg with fat layer exposed L97.822 Non-pressure chronic ulcer of other part of left lower leg with fat layer exposed I89.0 Lymphedema, not elsewhere classified I87.313 Chronic venous hypertension (idiopathic) with ulcer of bilateral lower extremity Facility Procedures CPT4: Code [id number] Description: 81 BILATERAL: Application of multi-layer venous compression system; leg (below knee), including ankle and t. Modifier: Quantity: 1 Physician Procedures : CPT4 Code Description Modifier [id number] - WC PHYS LEVEL 3 - EST PT ICD-10 Diagnosis Description C44.92 Squamous cell carcinoma of skin, unspecified L97.812 Non-pressure chronic ulcer of other part of right lower leg with fat layer exposed L97.822  Non-pressure chronic ulcer of other part of left lower leg with fat layer exposed I87.313 Chronic venous hypertension (idiopathic) with ulcer of bilateral lower extremity Quantity: 1 Electronic Signature(s) Signed: 10/07/2021 3:53:44 PM By: Kalman Shan DO Entered By: Kalman Shan on 10/07/2021 13:52:36

## 2021-10-07 NOTE — Addendum Note (Signed)
Addended by: [name] on: 10/07/2021 07:15 AM   Modules accepted: Orders

## 2021-10-08 DIAGNOSIS — M6281 Muscle weakness (generalized): Secondary | ICD-10-CM | POA: Diagnosis not present

## 2021-10-08 DIAGNOSIS — I5030 Unspecified diastolic (congestive) heart failure: Secondary | ICD-10-CM | POA: Diagnosis not present

## 2021-10-08 DIAGNOSIS — R2689 Other abnormalities of gait and mobility: Secondary | ICD-10-CM | POA: Diagnosis not present

## 2021-10-09 DIAGNOSIS — R2689 Other abnormalities of gait and mobility: Secondary | ICD-10-CM | POA: Diagnosis not present

## 2021-10-09 DIAGNOSIS — I5030 Unspecified diastolic (congestive) heart failure: Secondary | ICD-10-CM | POA: Diagnosis not present

## 2021-10-09 DIAGNOSIS — M6281 Muscle weakness (generalized): Secondary | ICD-10-CM | POA: Diagnosis not present

## 2021-10-09 NOTE — Telephone Encounter (Signed)
Al;ready spoke w/ patient see previous msg../l,b

## 2021-10-11 DIAGNOSIS — I5030 Unspecified diastolic (congestive) heart failure: Secondary | ICD-10-CM | POA: Diagnosis not present

## 2021-10-11 DIAGNOSIS — M6281 Muscle weakness (generalized): Secondary | ICD-10-CM | POA: Diagnosis not present

## 2021-10-11 DIAGNOSIS — R2689 Other abnormalities of gait and mobility: Secondary | ICD-10-CM | POA: Diagnosis not present

## 2021-10-14 ENCOUNTER — Encounter (HOSPITAL_BASED_OUTPATIENT_CLINIC_OR_DEPARTMENT_OTHER): Payer: Medicare Other | Admitting: Internal Medicine

## 2021-10-14 DIAGNOSIS — L97812 Non-pressure chronic ulcer of other part of right lower leg with fat layer exposed: Secondary | ICD-10-CM | POA: Diagnosis not present

## 2021-10-14 DIAGNOSIS — I890 Lymphedema, not elsewhere classified: Secondary | ICD-10-CM | POA: Diagnosis not present

## 2021-10-14 DIAGNOSIS — I87313 Chronic venous hypertension (idiopathic) with ulcer of bilateral lower extremity: Secondary | ICD-10-CM | POA: Diagnosis not present

## 2021-10-14 DIAGNOSIS — C4492 Squamous cell carcinoma of skin, unspecified: Secondary | ICD-10-CM | POA: Diagnosis not present

## 2021-10-14 DIAGNOSIS — L97822 Non-pressure chronic ulcer of other part of left lower leg with fat layer exposed: Secondary | ICD-10-CM | POA: Diagnosis not present

## 2021-10-14 NOTE — Progress Notes (Signed)
[name] ([id number]) , Visit Report for 10/14/2021 Arrival Information Details Patient Name: Date of Service: [name], [place]. 10/14/2021 1:30 PM Medical Record Number: [id number] Patient Account Number: [id number] Date of Birth/Sex: Treating RN: [birth year] (88 y.o. [name], [name] Primary Care Raychell Holcomb: [name] Other Clinician: Referring Janney Priego: Treating Keyonia Gluth/Extender: [name] in Treatment: 35 Visit Information History Since Last Visit Added or deleted any medications: No Patient Arrived: Wheel Chair Any new allergies or adverse reactions: No Arrival Time: 13:30 Had a fall or experienced change in No Accompanied By: self activities of daily living that may affect Transfer Assistance: Manual risk of falls: Patient Identification Verified: Yes Signs or symptoms of abuse/neglect since last visito No Secondary Verification Process Completed: Yes Hospitalized since last visit: No Patient Requires Transmission-Based No Implantable device outside of the clinic excluding No Precautions: cellular tissue based products placed in the center Patient Has Alerts: Yes since last visit: Patient Alerts: R ABI = Non compressible Has Dressing in Place as Prescribed: Yes L ABI = Non Has Compression in Place as Prescribed: Yes compressible Pain Present Now: No Electronic Signature(s) Signed: 10/14/2021 5:50:10 PM By: Deon Pilling RN, BSN Entered By: Deon Pilling on 10/14/2021 16:37:31 -------------------------------------------------------------------------------- Compression Therapy Details Patient Name: Date of Service: [name], [name] 10/14/2021 1:30 PM Medical Record Number: [id number] Patient Account Number: [id number] Date of Birth/Sex: Treating RN: Dec 17, 1925 (88 y.o. [name] Primary Care Joal Eakle: [name] Other Clinician: Referring Rulon Abdalla: Treating Ayden Hardwick/Extender: [name] in Treatment: 92 Compression Therapy Performed for Wound Assessment: Wound #20 Right,Circumferential Lower Leg Performed By: Clinician Deon Pilling, RN Compression Type: Three Hydrologist) Signed: 10/14/2021 5:50:10 PM By: Deon Pilling RN, BSN Entered By: Deon Pilling on 10/14/2021 16:38:01 -------------------------------------------------------------------------------- Compression Therapy Details Patient Name: Date of Service: [name], [place]. 10/14/2021 1:30 PM Medical Record Number: [id number] Patient Account Number: [id number] Date of Birth/Sex: Treating RN: 01-07-1926 (88 y.o. [name] Primary Care Keyshia Orwick: [name] Other Clinician: Referring Ira Busbin: Treating Camren Henthorn/Extender: [name] in Treatment: 8 Compression Therapy Performed for Wound Assessment: Wound #7RR Left,Circumferential Lower Leg Performed By: Clinician Deon Pilling, RN Compression Type: Three Hydrologist) Signed: 10/14/2021 5:50:10 PM By: Deon Pilling RN, BSN Entered By: Deon Pilling on 10/14/2021 16:38:01 -------------------------------------------------------------------------------- Encounter Discharge Information Details Patient Name: Date of Service: [name], [place]. 10/14/2021 1:30 PM Medical Record Number: [id number] Patient Account Number: [id number] Date of Birth/Sex: Treating RN: 1925/08/04 (88 y.o. [name] Primary Care Genasis Zingale: [name] Other Clinician: Referring Taras Rask: Treating Isaiah Cianci/Extender: [name] in Treatment: 19 Encounter Discharge Information Items Discharge Condition: Stable Ambulatory Status: Wheelchair Discharge Destination: Home Transportation: Private Auto Accompanied By: self Schedule Follow-up Appointment: Yes Clinical Summary of Care: Electronic Signature(s) Signed: 10/14/2021 5:50:10 PM By: Deon Pilling RN, BSN Entered By: Deon Pilling on 10/14/2021 16:38:39 -------------------------------------------------------------------------------- Patient/Caregiver Education Details Patient Name: Date of Service: [name], [name] 9/25/2023andnbsp1:30 PM Medical Record Number: [id number] Patient Account Number: [id number] Date of Birth/Gender: Treating RN: 1925/11/07 (88 y.o. [name] Primary Care Physician: [name] Other Clinician: Referring Physician: Treating Physician/Extender: [name] in Treatment: 59 Education Assessment Education Provided To: Patient Education Topics Provided Wound/Skin Impairment: Handouts: Skin Care Do's and Dont's Methods: Explain/Verbal Responses: Reinforcements needed Electronic Signature(s) Signed: 10/14/2021 5:50:10 PM By: Deon Pilling RN, BSN Entered By: Deon Pilling on  10/14/2021 16:38:26 -------------------------------------------------------------------------------- Wound Assessment Details Patient Name: Date of Service: [name], [name]. 10/14/2021 1:30 PM Medical Record Number: [id number] Patient Account Number: [id number] Date of Birth/Sex: Treating RN: [birth year] (88 y.o. [name], [name] Primary Care Lanell Dubie: [name] Other Clinician: Referring Karleen Seebeck: Treating Lashaunta Sicard/Extender: [name] in Treatment: 2 Wound Status Wound Number: 20 Primary Etiology: Lymphedema Wound Location: Right, Circumferential Lower Leg Wound Status: Open Wounding Event: Gradually Appeared Date Acquired: 06/25/2021 Weeks Of Treatment: 15 Clustered Wound: Yes Wound Measurements Length: (cm) 16.5 Width: (cm) 9 Depth: (cm) 0.1 Area: (cm) 116.632 Volume: (cm) 11.663 % Reduction in Area: 70.7% % Reduction in Volume: 70.7% Wound Description Classification: Full Thickness Without Exposed Support Structu Exudate Amount: Medium Exudate Type:  Serosanguineous Exudate Color: red, brown res Treatment Notes Wound #20 (Lower Leg) Wound Laterality: Right, Circumferential Cleanser Soap and Water Discharge Instruction: May shower and wash wound with dial antibacterial soap and water prior to dressing change. Wound Cleanser Discharge Instruction: Cleanse the wound with wound cleanser prior to applying a clean dressing using gauze sponges, not tissue or cotton balls. Peri-Wound Care cetaphil lotion Discharge Instruction: patient to provide to nurses to apply to both legs. Topical Primary Dressing Maxorb Extra Calcium Alginate Dressing, 4x4 in Discharge Instruction: Apply calcium alginate to wound bed as instructed Secondary Dressing ABD Pad, 8x10 Discharge Instruction: Apply over primary dressing as directed. Secured With Compression Wrap ThreePress (3 layer compression wrap) Discharge Instruction: Apply three layer compression as directed. ***PLEASE USE KERLIX INSTEAD OF COTTON.*** Compression Stockings Add-Ons Electronic Signature(s) Signed: 10/14/2021 5:50:10 PM By: Deon Pilling RN, BSN Entered By: Deon Pilling on 10/14/2021 16:37:49 -------------------------------------------------------------------------------- Wound Assessment Details Patient Name: Date of Service: [name], [place]. 10/14/2021 1:30 PM Medical Record Number: [id number] Patient Account Number: [id number] Date of Birth/Sex: Treating RN: 14-Mar-1925 (88 y.o. [name], [name] Primary Care Sarahmarie Leavey: [name] Other Clinician: Referring Luisfelipe [place]: Treating Vesta Wheeland/Extender: [name] in Treatment: 4 Wound Status Wound Number: 7RR Primary Etiology: Malignant Wound Wound Location: Left, Circumferential Lower Leg Wound Status: Open Wounding Event: Blister Date Acquired: 04/20/2020 Weeks Of Treatment: 70 Clustered Wound: Yes Wound Measurements Length: (cm) 1 Width: (cm) 1.5 Depth: (cm) 0.1 Area: (cm)  1.178 Volume: (cm) 0.118 % Reduction in Area: 61.6% % Reduction in Volume: 61.6% Wound Description Classification: Full Thickness Without Exposed Support Structu Exudate Amount: Medium Exudate Type: Serosanguineous Exudate Color: red, brown res Treatment Notes Wound #7RR (Lower Leg) Wound Laterality: Left, Circumferential Cleanser Soap and Water Discharge Instruction: May shower and wash wound with dial antibacterial soap and water prior to dressing change. Wound Cleanser Discharge Instruction: Cleanse the wound with wound cleanser prior to applying a clean dressing using gauze sponges, not tissue or cotton balls. Peri-Wound Care cetaphil lotion Discharge Instruction: patient to provide to nurses to apply to both legs. Topical Gentamicin Discharge Instruction: As directed by physician Mupirocin Ointment Discharge Instruction: Apply Mupirocin (Bactroban) as instructed Primary Dressing Maxorb Extra Calcium Alginate Dressing, 4x4 in Discharge Instruction: Apply calcium alginate to wound bed as instructed Secondary Dressing ABD Pad, 8x10 Discharge Instruction: Apply over primary dressing as directed. Secured With Compression Wrap ThreePress (3 layer compression wrap) Discharge Instruction: Apply three layer compression as directed. ***PLEASE USE KERLIX INSTEAD OF COTTON.*** Compression Stockings Add-Ons Electronic Signature(s) Signed: 10/14/2021 5:50:10 PM By: Deon Pilling RN, BSN Entered By: Deon Pilling on 10/14/2021 16:37:49

## 2021-10-15 NOTE — Progress Notes (Signed)
[name] ([id number]) , Visit Report for 10/14/2021 SuperBill Details Patient Name: Date of Service: [name], [name]. 10/14/2021 Medical Record Number: [id number] Patient Account Number: [id number] Date of Birth/Sex: Treating RN: 1925/10/11 (88 y.o. [name], [name] Primary Care Provider: [name] Other Clinician: Referring Provider: Treating Provider/Extender: [name] in Treatment: 26 Diagnosis Coding ICD-10 Codes Code Description C44.92 Squamous cell carcinoma of skin, unspecified L97.812 Non-pressure chronic ulcer of other part of right lower leg with fat layer exposed L97.822 Non-pressure chronic ulcer of other part of left lower leg with fat layer exposed I89.0 Lymphedema, not elsewhere classified I87.313 Chronic venous hypertension (idiopathic) with ulcer of bilateral lower extremity Facility Procedures CPT4 Description Modifier Quantity Code [id number] BILATERAL: Application of multi-layer venous compression system; leg (below knee), including ankle and 1 foot. Electronic Signature(s) Signed: 10/14/2021 5:50:10 PM By: Deon Pilling RN, BSN Signed: 10/15/2021 11:12:17 AM By: Kalman Shan DO Entered By: Deon Pilling on 10/14/2021 16:38:45

## 2021-10-16 DIAGNOSIS — I5030 Unspecified diastolic (congestive) heart failure: Secondary | ICD-10-CM | POA: Diagnosis not present

## 2021-10-16 DIAGNOSIS — R2689 Other abnormalities of gait and mobility: Secondary | ICD-10-CM | POA: Diagnosis not present

## 2021-10-16 DIAGNOSIS — M6281 Muscle weakness (generalized): Secondary | ICD-10-CM | POA: Diagnosis not present

## 2021-10-17 ENCOUNTER — Other Ambulatory Visit: Payer: Self-pay

## 2021-10-18 DIAGNOSIS — I5030 Unspecified diastolic (congestive) heart failure: Secondary | ICD-10-CM | POA: Diagnosis not present

## 2021-10-18 DIAGNOSIS — R2689 Other abnormalities of gait and mobility: Secondary | ICD-10-CM | POA: Diagnosis not present

## 2021-10-18 DIAGNOSIS — M6281 Muscle weakness (generalized): Secondary | ICD-10-CM | POA: Diagnosis not present

## 2021-10-21 ENCOUNTER — Encounter (HOSPITAL_BASED_OUTPATIENT_CLINIC_OR_DEPARTMENT_OTHER): Payer: Medicare Other | Attending: Internal Medicine | Admitting: Internal Medicine

## 2021-10-21 DIAGNOSIS — I87313 Chronic venous hypertension (idiopathic) with ulcer of bilateral lower extremity: Secondary | ICD-10-CM

## 2021-10-21 DIAGNOSIS — Z85828 Personal history of other malignant neoplasm of skin: Secondary | ICD-10-CM | POA: Diagnosis not present

## 2021-10-21 DIAGNOSIS — L97812 Non-pressure chronic ulcer of other part of right lower leg with fat layer exposed: Secondary | ICD-10-CM | POA: Diagnosis not present

## 2021-10-21 DIAGNOSIS — I890 Lymphedema, not elsewhere classified: Secondary | ICD-10-CM | POA: Insufficient documentation

## 2021-10-21 DIAGNOSIS — I509 Heart failure, unspecified: Secondary | ICD-10-CM | POA: Insufficient documentation

## 2021-10-21 DIAGNOSIS — E039 Hypothyroidism, unspecified: Secondary | ICD-10-CM | POA: Diagnosis not present

## 2021-10-21 DIAGNOSIS — I110 Hypertensive heart disease with heart failure: Secondary | ICD-10-CM | POA: Diagnosis not present

## 2021-10-21 DIAGNOSIS — M1990 Unspecified osteoarthritis, unspecified site: Secondary | ICD-10-CM | POA: Insufficient documentation

## 2021-10-21 DIAGNOSIS — L97822 Non-pressure chronic ulcer of other part of left lower leg with fat layer exposed: Secondary | ICD-10-CM | POA: Insufficient documentation

## 2021-10-21 DIAGNOSIS — I872 Venous insufficiency (chronic) (peripheral): Secondary | ICD-10-CM | POA: Insufficient documentation

## 2021-10-21 DIAGNOSIS — C4492 Squamous cell carcinoma of skin, unspecified: Secondary | ICD-10-CM | POA: Diagnosis not present

## 2021-10-21 NOTE — Progress Notes (Signed)
[name] ([id number]) , Visit Report for 10/21/2021 Arrival Information Details Patient Name: Date of Service: [name], [name]. 10/21/2021 1:30 PM Medical Record Number: [id number] Patient Account Number: [id number] Date of Birth/Sex: Treating RN: [birth year] (88 y.o. [name], [name] Primary Care [name]: [name] Other Clinician: Referring Brinda Focht: Treating Jerzy Roepke/Extender: [name] in Treatment: 31 Visit Information History Since Last Visit Added or deleted any medications: No Patient Arrived: Walker Any new allergies or adverse reactions: No Arrival Time: 13:40 Had a fall or experienced change in No Accompanied By: self activities of daily living that may affect Transfer Assistance: None risk of falls: Patient Identification Verified: Yes Signs or symptoms of abuse/neglect since last visito No Secondary Verification Process Completed: Yes Hospitalized since last visit: No Patient Requires Transmission-Based No Implantable device outside of the clinic excluding No Precautions: cellular tissue based products placed in the center Patient Has Alerts: Yes since last visit: Patient Alerts: R ABI = Non compressible Has Dressing in Place as Prescribed: Yes L ABI = Non compressible Has Compression in Place as Prescribed: Yes Pain Present Now: No Electronic Signature(s) Signed: 10/21/2021 4:42:08 PM By: Deon Pilling RN, BSN Entered By: Deon Pilling on 10/21/2021 13:44:08 -------------------------------------------------------------------------------- Compression Therapy Details Patient Name: Date of Service: [name], [name] 10/21/2021 1:30 PM Medical Record Number: [id number] Patient Account Number: [id number] Date of Birth/Sex: Treating RN: 1925/09/03 (88 y.o. [name] Primary Care Tyrece [place]ter[place]: [name] Other Clinician: Referring [name]: Treating [name]/Extender: [name] in Treatment: 54 Compression Therapy Performed for Wound Assessment: Wound #20 Bell,Circumferential Lower Leg Performed By: Clinician Deon Pilling, RN Compression Type: Three Layer Post Procedure Diagnosis Same as Pre-procedure Electronic Signature(s) Signed: 10/21/2021 4:42:08 PM By: Deon Pilling RN, BSN Entered By: Deon Pilling on 10/21/2021 14:18:43 -------------------------------------------------------------------------------- Compression Therapy Details Patient Name: Date of Service: [name], [name] 10/21/2021 1:30 PM Medical Record Number: [id number] Patient Account Number: [id number] Date of Birth/Sex: Treating RN: [birth year] (88 y.o. [name] Primary Care Thurma Priego: [name] Other Clinician: Referring Liya Strollo: Treating [name]/Extender: [name] in Treatment: 85 Compression Therapy Performed for Wound Assessment: Wound #7RR Left,Circumferential Lower Leg Performed By: Clinician Deon Pilling, RN Compression Type: Three Layer Post Procedure Diagnosis Same as Pre-procedure Electronic Signature(s) Signed: 10/21/2021 4:42:08 PM By: Deon Pilling RN, BSN Entered By: Deon Pilling on 10/21/2021 14:18:43 -------------------------------------------------------------------------------- Encounter Discharge Information Details Patient Name: Date of Service: [name], [name]. 10/21/2021 1:30 PM Medical Record Number: [id number] Patient Account Number: [id number] Date of Birth/Sex: Treating RN: [birth year] (88 y.o. [name] Primary Care [name]: [name] Other Clinician: Referring [name]: Treating [name]/Extender: [name] in Treatment: 52 Encounter Discharge Information Items Discharge Condition: Stable Ambulatory Status: Walker Discharge Destination: Home Transportation: Private Auto Accompanied By: self Schedule Follow-up Appointment:  Yes Clinical Summary of Care: Electronic Signature(s) Signed: 10/21/2021 4:42:08 PM By: Deon Pilling RN, BSN Entered By: Deon Pilling on 10/21/2021 14:24:42 -------------------------------------------------------------------------------- Lower Extremity Assessment Details Patient Name: Date of Service: [name], [name]. 10/21/2021 1:30 PM Medical Record Number: [id number] Patient Account Number: [id number] Date of Birth/Sex: Treating RN: 03/23/1925 (88 y.o. [name] Primary Care [name]: [name] Other Clinician: Referring [name]: Treating [name]/Extender: [name] in Treatment: 34 Edema Assessment Assessed: [Left: No] [Bell: No] Edema: [Left: No] [Bell: No] Calf Left: Bell: Point of Measurement: 30 cm From Medial Instep 30 cm  30 cm Ankle Left: Bell: Point of Measurement: 9 cm From Medial Instep 22 cm 22 cm Vascular Assessment Pulses: Dorsalis Pedis Palpable: [Left:Yes] [Bell:Yes] Electronic Signature(s) Signed: 10/21/2021 4:42:08 PM By: Deon Pilling RN, BSN Entered By: Deon Pilling on 10/21/2021 13:52:28 -------------------------------------------------------------------------------- Multi Wound Chart Details Patient Name: Date of Service: [name], [name] 10/21/2021 1:30 PM Medical Record Number: [id number] Patient Account Number: [id number] Date of Birth/Sex: Treating RN: 1925/06/05 (88 y.o. [name], [name] Primary Care [name]: [name] Other Clinician: Referring [name]: Treating [name]/Extender: [name] in Treatment: 23 Vital Signs Height(in): 65 Pulse(bpm): 37 Weight(lbs): 163 Blood Pressure(mmHg): 144/84 Body Mass Index(BMI): 27.1 Temperature(F): 97.5 Respiratory Rate(breaths/min): 20 Photos: [N/A:N/A] Bell, Circumferential Lower Leg Left, Circumferential Lower Leg N/A Wound Location: Gradually Appeared Blister N/A Wounding  Event: Lymphedema Malignant Wound N/A Primary Etiology: Anemia, Arrhythmia, Congestive Heart Anemia, Arrhythmia, Congestive Heart N/A Comorbid History: Failure, Hypertension, Colitis, Failure, Hypertension, Colitis, Osteoarthritis Osteoarthritis 06/25/2021 04/20/2020 N/A Date Acquired: 16 71 N/A Weeks of Treatment: Open Open N/A Wound Status: No Yes N/A Wound Recurrence: Yes Yes N/A Clustered Wound: 5 5 N/A Clustered Quantity: 16.5x15x0.1 11x11x0.1 N/A Measurements L x W x D (cm) 194.386 95.033 N/A A (cm) : rea 19.439 9.503 N/A Volume (cm) : 51.10% -2994.50% N/A % Reduction in Area: 51.10% -2995.40% N/A % Reduction in Volume: Full Thickness Without Exposed Full Thickness Without Exposed N/A Classification: Support Structures Support Structures Medium Medium N/A Exudate Amount: Serosanguineous Serosanguineous N/A Exudate Type: red, brown red, brown N/A Exudate Color: Distinct, outline attached Distinct, outline attached N/A Wound Margin: Large (67-100%) Large (67-100%) N/A Granulation Amount: Red, Pink Pink, Pale N/A Granulation Quality: Small (1-33%) None Present (0%) N/A Necrotic Amount: Fat Layer (Subcutaneous Tissue): Yes Fat Layer (Subcutaneous Tissue): Yes N/A Exposed Structures: Fascia: No Fascia: No Tendon: No Tendon: No Muscle: No Muscle: No Joint: No Joint: No Bone: No Bone: No Small (1-33%) Small (1-33%) N/A Epithelialization: Excoriation: No Excoriation: No N/A Periwound Skin Texture: Induration: No Induration: No Callus: No Callus: No Crepitus: No Crepitus: No Rash: No Rash: No Scarring: No Scarring: No Dry/Scaly: Yes Dry/Scaly: Yes N/A Periwound Skin Moisture: Maceration: No Maceration: No Hemosiderin Staining: Yes Hemosiderin Staining: Yes N/A Periwound Skin Color: Atrophie Blanche: No Atrophie Blanche: No Cyanosis: No Cyanosis: No Ecchymosis: No Ecchymosis: No Erythema: No Erythema: No Mottled: No Mottled:  No Pallor: No Pallor: No Rubor: No Rubor: No No Abnormality No Abnormality N/A Temperature: Yes Yes N/A Tenderness on Palpation: Treatment Notes Electronic Signature(s) Signed: 10/21/2021 4:32:49 PM By: Kalman Shan DO Signed: 10/21/2021 4:42:08 PM By: Deon Pilling RN, BSN Entered By: Kalman Shan on 10/21/2021 14:06:58 -------------------------------------------------------------------------------- Multi-Disciplinary Care Plan Details Patient Name: Date of Service: [name], [place]. 10/21/2021 1:30 PM Medical Record Number: [id number] Patient Account Number: [id number] Date of Birth/Sex: Treating RN: 17-Jul-1925 (88 y.o. [name] Primary Care Jabez Molner: [name] Other Clinician: Referring Laymon Stockert: Treating Lavonta Tillis/Extender: [name] in Treatment: 79 Multidisciplinary Care Plan reviewed with physician Active Inactive Venous Leg Ulcer Nursing Diagnoses: Knowledge deficit related to disease process and management Potential for venous Insuffiency (use before diagnosis confirmed) Goals: Patient will maintain optimal edema control Date Initiated: 11/01/2020 Target Resolution Date: 12/20/2021 Goal Status: Active Interventions: Assess peripheral edema status every visit. Compression as ordered Provide education on venous insufficiency Treatment Activities: Therapeutic compression applied : 11/01/2020 Notes: 12/27/20: Edema control ongoing 01/24/21: Edema control continues, using 3 layer compression Wound/Skin Impairment Nursing Diagnoses: Impaired tissue integrity  Knowledge deficit related to ulceration/compromised skin integrity Goals: Patient/caregiver will verbalize understanding of skin care regimen Date Initiated: 06/11/2020 Target Resolution Date: 12/20/2021 Goal Status: Active Interventions: Assess patient/caregiver ability to obtain necessary supplies Assess patient/caregiver ability to perform ulcer/skin  care regimen upon admission and as needed Assess ulceration(s) every visit Provide education on ulcer and skin care Notes: 10/11/20: Wound care regimen ongoing. 12/27/20: Wound care continues Electronic Signature(s) Signed: 10/21/2021 4:42:08 PM By: Deon Pilling RN, BSN Entered By: Deon Pilling on 10/21/2021 14:21:07 -------------------------------------------------------------------------------- Pain Assessment Details Patient Name: Date of Service: [name], [place]. 10/21/2021 1:30 PM Medical Record Number: [id number] Patient Account Number: [id number] Date of Birth/Sex: Treating RN: 01/08/1926 (88 y.o. [name] Primary Care Halen Mossbarger: [name] Other Clinician: Referring Jemario Poitras: Treating Avyonna Wagoner/Extender: [name] in Treatment: 78 Active Problems Location of Pain Severity and Description of Pain Patient Has Paino No Site Locations Rate the pain. Current Pain Level: 0 Pain Management and Medication Current Pain Management: Medication: No Cold Application: No Rest: No Massage: No Activity: No T.E.N.S.: No Heat Application: No Leg drop or elevation: No Is the Current Pain Management Adequate: Adequate How does your wound impact your activities of daily livingo Sleep: No Bathing: No Appetite: No Relationship With Others: No Bladder Continence: No Emotions: No Bowel Continence: No Work: No Toileting: No Drive: No Dressing: No Hobbies: No Engineer, maintenance) Signed: 10/21/2021 4:42:08 PM By: Deon Pilling RN, BSN Entered By: Deon Pilling on 10/21/2021 13:44:31 -------------------------------------------------------------------------------- Patient/Caregiver Education Details Patient Name: Date of Service: [name], [place] A. 10/2/2023andnbsp1:30 PM Medical Record Number: [id number] Patient Account Number: [id number] Date of Birth/Gender: Treating RN: [birth year] (88 y.o. [name] Primary Care  Physician: [name] Other Clinician: Referring Physician: Treating Physician/Extender: [name] in Treatment: 23 Education Assessment Education Provided To: Patient Education Topics Provided Wound/Skin Impairment: Handouts: Skin Care Do's and Dont's Methods: Explain/Verbal Responses: Reinforcements needed Electronic Signature(s) Signed: 10/21/2021 4:42:08 PM By: Deon Pilling RN, BSN Entered By: Deon Pilling on 10/21/2021 14:21:20 -------------------------------------------------------------------------------- Wound Assessment Details Patient Name: Date of Service: [name], [place]. 10/21/2021 1:30 PM Medical Record Number: [id number] Patient Account Number: [id number] Date of Birth/Sex: Treating RN: 1925/07/25 (88 y.o. [name], [name] Primary Care Althia Egolf: [name] Other Clinician: Referring Margel Joens: Treating Ronny Korff/Extender: [name] in Treatment: 87 Wound Status Wound Number: 20 Primary Lymphedema Etiology: Wound Location: Bell, Circumferential Lower Leg Wound Open Wounding Event: Gradually Appeared Status: Date Acquired: 06/25/2021 Comorbid Anemia, Arrhythmia, Congestive Heart Failure, Hypertension, Weeks Of Treatment: 16 History: Colitis, Osteoarthritis Clustered Wound: Yes Photos Wound Measurements Length: (cm) 16.5 Width: (cm) 15 Depth: (cm) 0.1 Clustered Quantity: 5 Area: (cm) 194.386 Volume: (cm) 19.439 % Reduction in Area: 51.1% % Reduction in Volume: 51.1% Epithelialization: Small (1-33%) Tunneling: No Undermining: No Wound Description Classification: Full Thickness Without Exposed Support Structures Wound Margin: Distinct, outline attached Exudate Amount: Medium Exudate Type: Serosanguineous Exudate Color: red, brown Foul Odor After Cleansing: No Slough/Fibrino No Wound Bed Granulation Amount: Large (67-100%) Exposed Structure Granulation  Quality: Red, Pink Fascia Exposed: No Necrotic Amount: Small (1-33%) Fat Layer (Subcutaneous Tissue) Exposed: Yes Necrotic Quality: Adherent Slough Tendon Exposed: No Muscle Exposed: No Joint Exposed: No Bone Exposed: No Periwound Skin Texture Texture Color No Abnormalities Noted: No No Abnormalities Noted: No Callus: No Atrophie Blanche: No Crepitus: No Cyanosis: No Excoriation: No Ecchymosis: No Induration: No Erythema: No Rash: No Hemosiderin Staining: Yes Scarring: No Mottled: No  Pallor: No Moisture Rubor: No No Abnormalities Noted: No Dry / Scaly: Yes Temperature / Pain Maceration: No Temperature: No Abnormality Tenderness on Palpation: Yes Treatment Notes Wound #20 (Lower Leg) Wound Laterality: Bell, Circumferential Cleanser Soap and Water Discharge Instruction: May shower and wash wound with dial antibacterial soap and water prior to dressing change. Wound Cleanser Discharge Instruction: Cleanse the wound with wound cleanser prior to applying a clean dressing using gauze sponges, not tissue or cotton balls. Peri-Wound Care cetaphil lotion Discharge Instruction: patient to provide to nurses to apply to both legs. Topical Primary Dressing KerraCel Ag Gelling Fiber Dressing, 4x5 in (silver alginate) Discharge Instruction: Apply silver alginate to wound bed as instructed Secondary Dressing ABD Pad, 8x10 Discharge Instruction: Apply over primary dressing as directed. Secured With Compression Wrap ThreePress (3 layer compression wrap) Discharge Instruction: Apply three layer compression as directed. ***PLEASE USE KERLIX INSTEAD OF COTTON.*** Compression Stockings Add-Ons Electronic Signature(s) Signed: 10/21/2021 4:42:08 PM By: Deon Pilling RN, BSN Signed: 10/21/2021 4:49:57 PM By: Erenest Blank Entered By: Erenest Blank on 10/21/2021 13:55:36 -------------------------------------------------------------------------------- Wound Assessment  Details Patient Name: Date of Service: [name], [place] 10/21/2021 1:30 PM Medical Record Number: [id number] Patient Account Number: [id number] Date of Birth/Sex: Treating RN: 1925/10/17 (88 y.o. [name], [name] Primary Care Lucylle Foulkes: [name] Other Clinician: Referring Amayrani Bennick: Treating Manuella Blackson/Extender: [name] in Treatment: 38 Wound Status Wound Number: 7RR Primary Malignant Wound Etiology: Wound Location: Left, Circumferential Lower Leg Wound Open Wounding Event: Blister Status: Date Acquired: 04/20/2020 Comorbid Anemia, Arrhythmia, Congestive Heart Failure, Hypertension, Weeks Of Treatment: 71 History: Colitis, Osteoarthritis Clustered Wound: Yes Photos Wound Measurements Length: (cm) 11 Width: (cm) 11 Depth: (cm) 0.1 Clustered Quantity: 5 Area: (cm) 95.033 Volume: (cm) 9.503 % Reduction in Area: -2994.5% % Reduction in Volume: -2995.4% Epithelialization: Small (1-33%) Tunneling: No Undermining: No Wound Description Classification: Full Thickness Without Exposed Support Structures Wound Margin: Distinct, outline attached Exudate Amount: Medium Exudate Type: Serosanguineous Exudate Color: red, brown Slough/Fibrino No Wound Bed Granulation Amount: Large (67-100%) Exposed Structure Granulation Quality: Pink, Pale Fascia Exposed: No Necrotic Amount: None Present (0%) Fat Layer (Subcutaneous Tissue) Exposed: Yes Tendon Exposed: No Muscle Exposed: No Joint Exposed: No Bone Exposed: No Periwound Skin Texture Texture Color No Abnormalities Noted: No No Abnormalities Noted: No Callus: No Atrophie Blanche: No Crepitus: No Cyanosis: No Excoriation: No Ecchymosis: No Induration: No Erythema: No Rash: No Hemosiderin Staining: Yes Scarring: No Mottled: No Pallor: No Moisture Rubor: No No Abnormalities Noted: No Dry / Scaly: Yes Temperature / Pain Maceration: No Temperature: No Abnormality Tenderness  on Palpation: Yes Treatment Notes Wound #7RR (Lower Leg) Wound Laterality: Left, Circumferential Cleanser Soap and Water Discharge Instruction: May shower and wash wound with dial antibacterial soap and water prior to dressing change. Wound Cleanser Discharge Instruction: Cleanse the wound with wound cleanser prior to applying a clean dressing using gauze sponges, not tissue or cotton balls. Peri-Wound Care cetaphil lotion Discharge Instruction: patient to provide to nurses to apply to both legs. Topical Primary Dressing KerraCel Ag Gelling Fiber Dressing, 4x5 in (silver alginate) Discharge Instruction: Apply silver alginate to wound bed as instructed Secondary Dressing ABD Pad, 8x10 Discharge Instruction: Apply over primary dressing as directed. Secured With Compression Wrap ThreePress (3 layer compression wrap) Discharge Instruction: Apply three layer compression as directed. ***PLEASE USE KERLIX INSTEAD OF COTTON.*** Compression Stockings Add-Ons Electronic Signature(s) Signed: 10/21/2021 4:42:08 PM By: Deon Pilling RN, BSN Signed: 10/21/2021 4:49:57 PM By: Erenest Blank Entered By: Barbarann Ehlers  Joelene Millin on 10/21/2021 13:55:52 -------------------------------------------------------------------------------- Vitals Details Patient Name: Date of Service: [name], [place] 10/21/2021 1:30 PM Medical Record Number: [id number] Patient Account Number: [id number] Date of Birth/Sex: Treating RN: [birth year] (88 y.o. [name], [name] Primary Care Regan Mcbryar: [name] Other Clinician: Referring Earlene Bjelland: Treating Tico Crotteau/Extender: [name] in Treatment: 86 Vital Signs Time Taken: 13:40 Temperature (F): 97.5 Height (in): 65 Pulse (bpm): 86 Weight (lbs): 163 Respiratory Rate (breaths/min): 20 Body Mass Index (BMI): 27.1 Blood Pressure (mmHg): 144/84 Reference Range: 80 - 120 mg / dl Electronic Signature(s) Signed: 10/21/2021 4:42:08 PM  By: Deon Pilling RN, BSN Entered By: Deon Pilling on 10/21/2021 13:44:22

## 2021-10-21 NOTE — Progress Notes (Signed)
[name] ([id number]) , Visit Report for 10/21/2021 Chief Complaint Document Details Patient Name: Date of Service: [name], [place]. 10/21/2021 1:30 PM Medical Record Number: [id number] Patient Account Number: [id number] Date of Birth/Sex: Treating RN: 1925/12/22 (88 y.o. [name] Primary Care Provider: [name] Other Clinician: Referring Provider: Treating Provider/Extender: [name] in Treatment: 70 Information Obtained from: Patient Chief Complaint Bilateral lower extremity wounds that have been biopsied and positive for squamous cell carcinoma Bilateral lower extremity wounds due to chronic venous insufficiency/lymphedema Electronic Signature(s) Signed: 10/21/2021 4:32:49 PM By: Kalman Shan DO Entered By: Kalman Shan on 10/21/2021 14:07:06 -------------------------------------------------------------------------------- HPI Details Patient Name: Date of Service: [name], [place]. 10/21/2021 1:30 PM Medical Record Number: [id number] Patient Account Number: [id number] Date of Birth/Sex: Treating RN: [birth year] (88 y.o. [name] Primary Care Provider: [name] Other Clinician: Referring Provider: Treating Provider/Extender: [name] in Treatment: 37 History of Present Illness Location: left leg HPI Description: Admission 5/23 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 She states that the wound on the right has been present for a year. The 1 on the left has opened up 1 month ago. She is followed with oncology for this issue as she had biopsies that showed squamous cell carcinoma. She is also seeing radiation oncology for treatment  options. She presents today because she would like for her wounds to be healed by [place]. She currently denies signs of infection. 6/1; patient presents for 1 week follow-up. She states she has tolerated the leg wraps well. She states these do not bother her and is happy to continue with them. She is scheduled to see her oncologist today to go over treatment options for the bilateral lower extremity squamous cell carcinoma. Radiation is currently not a recommended option. Patient states she overall feels well. 6/22; patient presents for 3-week follow-up. She has tolerated the wraps well until her last wrap where she states they were uncomfortable. She attributes this to the home health nurse. She denies signs of infection. She has started her first treatment of antibody infusions for her Bilateral lower extremity squamous cell carcinoma. She has no complaints today. 7/21; patient presents for 1 month follow-up. Unfortunately she has not had good experience with her wrap changes with home health. She would like to do her own dressing changes. She continues to do her antibody infusions. She denies signs of infection. 7/28; patient presents for 1 week follow-up. At last clinic visit she was switched to daily dressing changes due to issues with the wrap and home health placing them. Unfortunately she has developed weeping to her legs bilaterally. She would like to be placed in wraps today. She would also like to follow with [place] weekly for wrap changes instead of having home health change them. She denies signs of infection. 8/4; patient presents for 1 week follow-up. She has tolerated the Kerlix/Coban wraps well. She no longer has weeping to her legs. She took the wrap off 1 day before coming in to be able to take a shower. She has no issues or complaints today. She denies signs of infection. 8/18; patient presents for follow-up. Patient has tolerated the wraps well. She brought her Velcro compression wraps  today. She has no  issues or complaints today. She had her chemotherapy infusion yesterday without issues. She denies signs of infection. 8/25; patient presents for follow-up. She used her juxta lite compressions for the past week. It is unclear if she is able to put these on correctly since she states she has a hard time getting them to look right. She reports 2 open wounds. She currently denies signs of infection. 9/1; patient presents for 1 week follow-up. She has 1 open wound. She tolerated the compression wraps well. She currently denies signs of infection. 9/8; patient presents for 1 week follow-up. She has 2 open wounds 1 on each leg. She has tolerated the compression wrap well. She currently denies signs of infection. 9/15; patient presents for 1 week follow-up. She now has 3 wounds. 2 on the left and 1 on the right. She continues to tolerate the compression wrap well. She currently denies signs of infection. She has obtained furosemide by her primary care physician and would like to discuss when to take this. 9/22; patient presents for 1 week follow-up. She has scattered wounds on her lower extremities bilaterally. She did take furosemide twice in the past week. She does not recall having to urinate more frequently. She tolerated the 3 layer compression well. She denies signs of infection. 10/13; patient has not been here recently because of [place]. Apparently the facility was only putting gauze on her legs. This is a patient I do not normally see. 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 Ronnald Ramp at [facility] dermatology. She is quite convincing that she did not have radiation to her lower legs. It is likely she also has significant chronic venous insufficiency stasis dermatitis. We have been using silver alginate under kerlix Coban. She has obvious open areas medially on the right and areas on the left. She also has  areas of extensive dry flaking adherent areas on the right and to a lesser extent on the left anterior. Nodular areas on the left lateral lower leg left posterior calf and right mid calf medially. 10/21; patient presents for follow-up. She has no issues or complaints today. She has tolerated the 3 layer compression well bilaterally. She denies signs of infection. 10/27; patient presents for follow-up. She continues to tolerate the 3 layer compression wrap well. She denies signs of infection. 11/30; patient presents for follow-up. She has no issues or complaints today. 11/10; patient arrived in clinic today for nurse visit accompanied by her daughter from [place]. The daughter had multiple questions so we turned this into doctors visit. Apparently after the last time I saw this woman in October she went to see Dr. Ronnald Ramp but he did not take the wraps off. She previously was treated with Cemiplimab for her squamous cell carcinoma. She did not receive radiation to her legs. I had wanted Dr. Ronnald Ramp to look at this because of the exceptionally damaged skin on her lower legs bilaterally. She has odd looking wounds on the left medial lower leg also extending posteriorly which we have not made a lot of progress on. But she also has a raised hyperkeratotic nodules on the right anterior tibial area plaques on the left medial lower thigh. These are not areas that are under compression. We have been using silver alginate on any open areas Liberal TCA under 3 layer compression. 11/17; patient presents for follow-up. She had her wraps taken off yesterday for her dermatology appointment. She reports excessive  weeping to her legs bilaterally after the wraps were taken off. She currently denies signs of infection. 12/12/2020 upon evaluation today patient appears to be doing about the same in regard to her wounds. She was actually started on Cipro after having biopsies apparently at her dermatology clinic she does not  have the results back from the actual biopsy but the culture did return and apparently they placed her on the Cipro she started that just this morning. Other than that her legs appear to be doing okay at this time. 12/1; patient presents for follow-up. She has no issues or complaints today. She has started Lasix 40 mg daily to help with her bilateral leg swelling. 12/8; patient presents for follow-up. She has no issues or complaints today. 12/15; patient presents for 1 week follow-up. She has no issues or complaints today. 12/22; patient presents for follow-up. She has no issues or complaints today. 1/5; patient presents for follow-up. She has no issues or complaints today. She has tolerated the compression wraps well. 1/12; patient presents for follow-up. She is tolerated the compression wrap well and has no issues or complaints today. She denies signs of infection. 1/19; patient presents for follow-up. She took the compression wraps off 1 to 2 days ago to take a shower and did not have compression wraps replaced. She reports increased blistering throughout her legs bilaterally. She currently denies signs of infection. 1/26; patient presents for follow-up. She has no issues or complaints today. She tolerated the compression wraps well. She has not heard from oncology to schedule an appointment. 2/2; patient presents for follow-up. She has no issues or complaints today. She continues to tolerate the compression wrap well. 2/16; patient presents for follow-up. She has no issues or complaints today. 2/23; patient presents for follow-up. She has no issues or complaints today. Tomorrow she has an appointment with oncology to look at the suspicious lesion on her right lower extremity. She currently denies signs of infection. 3/2; patient presents for follow-up. She has been using her juxta light compression to the right lower extremity however there has been increased swelling and opening of wounds  throughout the legs with weeping. She currently denies signs of infection. She had no issues with the compression wrap to the left lower extremity. 3/16; patient presents for follow-up. She has no issues or complaints today. She states that she has been restarted on infusions for her squamous cell carcinoma of her legs. She has also been increased on Lasix to 40 mg twice daily. She has noticed a decrease in swelling to her legs. 3/23; patient presents for follow-up. She starts her second infusion next week for her squamous cell carcinoma lesions to her legs. She reports no issues with the compression wraps. 3/30; patient presents for follow-up. She has no issues or complaints today. 4/6; patient presents for follow-up. Her left lower extremity wounds have healed. She has her juxta light compression with her today. She has no issues or complaints today. 4/13; this is a patient we actually discharged to juxta lite stockings on the left leg the last time she was here. She came in on Monday for [place] to look at the leg and a nurse visit. She had massive swelling and numerous blisters and wound reopening. We put her back in 3 layer compression although I did not generate a note. Silver alginate on the wounded areas. We have also been doing the same on the right. In the meantime she is obtained her external compression pumps that were  previously ordered and she started to use them. She has skin cancers that are obvious on the right leg x2 her appointment with Dr. Jarome Matin of dermatology is on May 5.. She is also receiving weekly chemotherapy for recurrent presumably squamous cell carcinomas apparently has had 2 treatments 4/20; patient presents for follow-up. She has her new juxta lite compressions today. She continues to have open wounds to the left lower extremity. She has follow-up with dermatology in 2 weeks. She continues to have IV infusions for her squamous cell carcinoma lesions on her legs. 4/28;  the patient has bilateral lower extremity wounds predominantly on the right lateral upper leg, right anterior lower leg which in itself is probably a recurrent cancer as well as the left lateral lower leg. She has recurrent squamous cell carcinoma currently being treated with an IV infusion. She also has scattered nodules on her upper lower legs and thighs I am not certain of all of this is felt to be malignant as well We have been using silver alginate on the open areas wrapping her legs. She also has her external compression pumps at home but I am not clear that she is going to use these 5/2; patient presents for follow-up. She has not been taking her diuretics as prescribed. She sees Dr. Dian Situ, dermatology at the end of the week. She tolerated the compression wraps well today. She has her juxta lite compressions. 5/8; patient presents for follow-up. She saw Dr. Dian Situ, dermatology and she has nothing to report on. She has been using her juxta lite compression to the right lower extremity. She has tolerated the compression wrap well in the left lower extremity. She has not been taking her diuretic. She is scheduled to continue her infusions for her squamous cell carcinoma on her legs. 5/18; patient presents for follow-up. We wrapped her in 3 layer compression at last clinic visit. She has no issues or complaints today. 5/22; patient presents for follow-up. She has been wrapped in 3 layer compression bilaterally to her lower extremities over the past week. She has been scratching at the top of the wrap and picking at her skin. This area has increased warmth and erythema. 5/30; patient presents for follow-up. She completed Keflex with resolution of her symptoms to the right lower extremity. She no longer has increased warmth or erythema. She continues her infusions for her squamous cell carcinoma lesions. We continue to wrap her in 3 layer compression with silver alginate. She currently denies signs of  infection. 6/8; the patient went to see Dr. Ronnald Ramp of dermatology 2 days ago unfortunately we do not have his notes. They removed her compression wraps of course but did not adequately replace them. She comes in today with 3 wounds on the right lateral upper calf 1 medially below the obvious squamous cell carcinoma there is a large necrotic wound on the left lateral tibial area on the left and several blisters. The patient remains on Cemiplimad infusions for the squamous cell carcinoma bilaterally. She is not felt to be a good candidate for radiation. Some of the skin changes superiorly in the upper legs bilaterally according to Dr. Ronnald Ramp related to these infusions the same like fleshy nodules to me not blisters 6/13; patient presents for follow-up. We have been using silver alginate under 3 layer compression bilaterally. She has no issues or complaints today. 6/19; patient presents for follow-up. She will see her oncologist in 3 days and next week her dermatologist. She has no new complaints today.  We are using silver alginate under 3 layer compression bilaterally. 6/29; patient presents for follow-up. She she saw Dr. Darrin Nipper last week and States she received injections to the lesions on her legs. We will ask for office visit records. She could not be wrapped with compression and has more drainage on exam today. 7/6; patient presents for follow-up. She continues to receive injections to the lesions in her legs by her dermatologist. She had this done earlier today. She has no issues or complaints today. 7/13; patient presents for follow-up. She is following with Dr.Kavuri once weekly for treatment of her SCCs/KAs with IL5-FU. We have been wrapping her with silver alginate under Kerlix/Coban. She has no issues or complaints today. 7/20; patient presents for follow-up. She has elected to stop doing weekly treatments for her squamous cell carcinoma lesions. She states she experiences too much pain from  the injections. She is continuing her immunotherapy infusions. Next 1 is scheduled in August. T the legs we have been using silver alginate o under Kerlix/Coban. Her wounds actually look better today. 7/27; patient presents for follow-up. We have been using Medihoney and silver alginate under compression therapy. She has no issues or complaints today. 8/1; patient presents for follow-up. We have been using Medihoney and silver alginate under compression therapy. She reports no issues. She is scheduled for her infusion for her SCC later this week. 8/22; patient presents for follow-up. We have been using Medihoney and silver alginate under compression therapy. She reports no issues today. She has moved back home from the skilled nursing facility. She has home health that will be coming out to do OT and PT She would like for them to also do wound . care. 9/18; patient presents for follow-up. We have been using Medihoney and silver alginate under compression therapy. Home health is no longer coming out for wound care. She comes in weekly for wrap changes in our office During nurse visits. 10/2; patient presents for follow-up. We have been using silver alginate under compression therapy. She has no issues or complaints today. She states she is doing her last chemoinfusion next week. Electronic Signature(s) Signed: 10/21/2021 4:32:49 PM By: Kalman Shan DO Entered By: Kalman Shan on 10/21/2021 14:08:44 -------------------------------------------------------------------------------- Physical Exam Details Patient Name: Date of Service: [name], [place]. 10/21/2021 1:30 PM Medical Record Number: [id number] Patient Account Number: [id number] Date of Birth/Sex: Treating RN: February 21, 1925 (88 y.o. [name] Primary Care Provider: [name] Other Clinician: Referring Provider: Treating Provider/Extender: [name] in Treatment:  47 Constitutional respirations regular, non-labored and within target range for patient.. Cardiovascular 2+ dorsalis pedis/posterior tibialis pulses. Psychiatric pleasant and cooperative. Notes Multiple scattered open wounds to her lower extremities bilaterally limited to skin breakdown. 3 areas suspicious for malignancy. No surrounding signs of infection. Adequate edema control. Electronic Signature(s) Signed: 10/21/2021 4:32:49 PM By: Kalman Shan DO Entered By: Kalman Shan on 10/21/2021 14:09:57 -------------------------------------------------------------------------------- Physician Orders Details Patient Name: Date of Service: [name], [place]. 10/21/2021 1:30 PM Medical Record Number: [id number] Patient Account Number: [id number] Date of Birth/Sex: Treating RN: 1925/12/15 (88 y.o. [name] Primary Care Provider: [name] Other Clinician: Referring Provider: Treating Provider/Extender: [name] in Treatment: 47 Verbal / Phone Orders: No Diagnosis Coding ICD-10 Coding Code Description C44.92 Squamous cell carcinoma of skin, unspecified L97.812 Non-pressure chronic ulcer of other part of right lower leg with fat layer exposed L97.822 Non-pressure chronic ulcer of other part of left lower  leg with fat layer exposed I89.0 Lymphedema, not elsewhere classified I87.313 Chronic venous hypertension (idiopathic) with ulcer of bilateral lower extremity Follow-up Appointments Return appointment in 3 weeks. - Dr. Heber [place] and [name], Room 8 Nurse Visit: - Next weekly and every 3 weeks see Dr. Heber [place]. Bathing/ Shower/ Hygiene May shower with protection but do not get wound dressing(s) wet. May shower and wash wound with soap and water. - with dressing changes may wash wounds with soap and water. Edema Control - Lymphedema / SCD / Other Lymphedema Pumps. Use Lymphedema pumps on leg(s) 2-3 times a day for 45-60 minutes. If  wearing any wraps or hose, do not remove them. Continue exercising as instructed. - use one hour each time twice a day. Elevate legs to the level of the heart or above for 30 minutes daily and/or when sitting, a frequency of: - elevate the legs throughout the day heart level if possible. Avoid standing for long periods of time. Exercise regularly Additional Orders / Instructions Follow Nutritious Diet Wound Treatment Wound #20 - Lower Leg Wound Laterality: Right, Circumferential Cleanser: Soap and Water 1 x Per Week/30 Days Discharge Instructions: May shower and wash wound with dial antibacterial soap and water prior to dressing change. Cleanser: Wound Cleanser 1 x Per Week/30 Days Discharge Instructions: Cleanse the wound with wound cleanser prior to applying a clean dressing using gauze sponges, not tissue or cotton balls. Peri-Wound Care: cetaphil lotion 1 x Per Week/30 Days Discharge Instructions: patient to provide to nurses to apply to both legs. Prim Dressing: KerraCel Ag Gelling Fiber Dressing, 4x5 in (silver alginate) 1 x Per Week/30 Days ary Discharge Instructions: Apply silver alginate to wound bed as instructed Secondary Dressing: ABD Pad, 8x10 1 x Per Week/30 Days Discharge Instructions: Apply over primary dressing as directed. Compression Wrap: ThreePress (3 layer compression wrap) 1 x Per Week/30 Days Discharge Instructions: Apply three layer compression as directed. ***PLEASE USE KERLIX INSTEAD OF COTTON.*** Wound #7RR - Lower Leg Wound Laterality: Left, Circumferential Cleanser: Soap and Water 1 x Per Week/30 Days Discharge Instructions: May shower and wash wound with dial antibacterial soap and water prior to dressing change. Cleanser: Wound Cleanser 1 x Per Week/30 Days Discharge Instructions: Cleanse the wound with wound cleanser prior to applying a clean dressing using gauze sponges, not tissue or cotton balls. Peri-Wound Care: cetaphil lotion 1 x Per Week/30  Days Discharge Instructions: patient to provide to nurses to apply to both legs. Prim Dressing: KerraCel Ag Gelling Fiber Dressing, 4x5 in (silver alginate) 1 x Per Week/30 Days ary Discharge Instructions: Apply silver alginate to wound bed as instructed Secondary Dressing: ABD Pad, 8x10 1 x Per Week/30 Days Discharge Instructions: Apply over primary dressing as directed. Compression Wrap: ThreePress (3 layer compression wrap) 1 x Per Week/30 Days Discharge Instructions: Apply three layer compression as directed. ***PLEASE USE KERLIX INSTEAD OF COTTON.*** Electronic Signature(s) Signed: 10/21/2021 4:32:49 PM By: Kalman Shan DO Signed: 10/21/2021 4:42:08 PM By: Deon Pilling RN, BSN Entered By: Deon Pilling on 10/21/2021 14:19:49 -------------------------------------------------------------------------------- Problem List Details Patient Name: Date of Service: [name], [place]. 10/21/2021 1:30 PM Medical Record Number: [id number] Patient Account Number: [id number] Date of Birth/Sex: Treating RN: 02-04-1925 (88 y.o. [name] Primary Care Provider: [name] Other Clinician: Referring Provider: Treating Provider/Extender: [name] in Treatment: 40 Active Problems ICD-10 Encounter Code Description Active Date MDM Diagnosis C44.92 Squamous cell carcinoma of skin, unspecified 06/11/2020 No Yes L97.812 Non-pressure chronic ulcer of other part  of right lower leg with fat layer 06/11/2020 No Yes exposed L97.822 Non-pressure chronic ulcer of other part of left lower leg with fat layer exposed5/23/2022 No Yes I89.0 Lymphedema, not elsewhere classified 01/31/2021 No Yes I87.313 Chronic venous hypertension (idiopathic) with ulcer of bilateral lower extremity 03/21/2021 No Yes Inactive Problems Resolved Problems Electronic Signature(s) Signed: 10/21/2021 4:32:49 PM By: Kalman Shan DO Entered By: Kalman Shan on 10/21/2021  14:06:53 -------------------------------------------------------------------------------- Progress Note Details Patient Name: Date of Service: [name], [place]. 10/21/2021 1:30 PM Medical Record Number: [id number] Patient Account Number: [id number] Date of Birth/Sex: Treating RN: April 10, 1925 (88 y.o. [name], [name] Primary Care Provider: [name] Other Clinician: Referring Provider: Treating Provider/Extender: [name] in Treatment: 52 Subjective Chief Complaint Information obtained from Patient Bilateral lower extremity wounds that have been biopsied and positive for squamous cell carcinoma Bilateral lower extremity wounds due to chronic venous insufficiency/lymphedema History of Present Illness (HPI) The following HPI elements were documented for the patient's wound: Location: left leg Admission 5/23 Ms. Cinde Ebert is a 88 year old female with a past medical history of 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 She states that the wound on the right has been present for a year. The 1 on the left has opened up 1 month ago. She is followed with oncology for this issue as she had biopsies that showed squamous cell carcinoma. She is also seeing radiation oncology for treatment options. She presents today because she would like for her wounds to be healed by [place]. She currently denies signs of infection. 6/1; patient presents for 1 week follow-up. She states she has tolerated the leg wraps well. She states these do not bother her and is happy to continue with them. She is scheduled to see her oncologist today to go over treatment options for the bilateral lower extremity squamous cell carcinoma. Radiation is currently not a recommended option. Patient states she overall feels  well. 6/22; patient presents for 3-week follow-up. She has tolerated the wraps well until her last wrap where she states they were uncomfortable. She attributes this to the home health nurse. She denies signs of infection. She has started her first treatment of antibody infusions for her Bilateral lower extremity squamous cell carcinoma. She has no complaints today. 7/21; patient presents for 1 month follow-up. Unfortunately she has not had good experience with her wrap changes with home health. She would like to do her own dressing changes. She continues to do her antibody infusions. She denies signs of infection. 7/28; patient presents for 1 week follow-up. At last clinic visit she was switched to daily dressing changes due to issues with the wrap and home health placing them. Unfortunately she has developed weeping to her legs bilaterally. She would like to be placed in wraps today. She would also like to follow with [place] weekly for wrap changes instead of having home health change them. She denies signs of infection. 8/4; patient presents for 1 week follow-up. She has tolerated the Kerlix/Coban wraps well. She no longer has weeping to her legs. She took the wrap off 1 day before coming in to be able to take a shower. She has no issues or complaints today. She denies signs of infection. 8/18; patient presents for follow-up. Patient has tolerated the wraps well. She brought her Velcro compression wraps today. She has no  issues or complaints today. She had her chemotherapy infusion yesterday without issues. She denies signs of infection. 8/25; patient presents for follow-up. She used her juxta lite compressions for the past week. It is unclear if she is able to put these on correctly since she states she has a hard time getting them to look right. She reports 2 open wounds. She currently denies signs of infection. 9/1; patient presents for 1 week follow-up. She has 1 open wound. She tolerated the  compression wraps well. She currently denies signs of infection. 9/8; patient presents for 1 week follow-up. She has 2 open wounds 1 on each leg. She has tolerated the compression wrap well. She currently denies signs of infection. 9/15; patient presents for 1 week follow-up. She now has 3 wounds. 2 on the left and 1 on the right. She continues to tolerate the compression wrap well. She currently denies signs of infection. She has obtained furosemide by her primary care physician and would like to discuss when to take this. 9/22; patient presents for 1 week follow-up. She has scattered wounds on her lower extremities bilaterally. She did take furosemide twice in the past week. She does not recall having to urinate more frequently. She tolerated the 3 layer compression well. She denies signs of infection. 10/13; patient has not been here recently because of [place]. Apparently the facility was only putting gauze on her legs. This is a patient I do not normally see. 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 Ronnald Ramp at [facility] dermatology. She is quite convincing that she did not have radiation to her lower legs. It is likely she also has significant chronic venous insufficiency stasis dermatitis. We have been using silver alginate under kerlix Coban. She has obvious open areas medially on the right and areas on the left. She also has areas of extensive dry flaking adherent areas on the right and to a lesser extent on the left anterior. Nodular areas on the left lateral lower leg left posterior calf and right mid calf medially. 10/21; patient presents for follow-up. She has no issues or complaints today. She has tolerated the 3 layer compression well bilaterally. She denies signs of infection. 10/27; patient presents for follow-up. She continues to tolerate the 3 layer compression wrap well. She denies signs of  infection. 11/30; patient presents for follow-up. She has no issues or complaints today. 11/10; patient arrived in clinic today for nurse visit accompanied by her daughter from [place]. The daughter had multiple questions so we turned this into doctors visit. Apparently after the last time I saw this woman in October she went to see Dr. Ronnald Ramp but he did not take the wraps off. She previously was treated with Cemiplimab for her squamous cell carcinoma. She did not receive radiation to her legs. I had wanted Dr. Ronnald Ramp to look at this because of the exceptionally damaged skin on her lower legs bilaterally. She has odd looking wounds on the left medial lower leg also extending posteriorly which we have not made a lot of progress on. But she also has a raised hyperkeratotic nodules on the right anterior tibial area plaques on the left medial lower thigh. These are not areas that are under compression. We have been using silver alginate on any open areas Liberal TCA under 3 layer compression. 11/17; patient presents for follow-up. She had her wraps taken off yesterday for her dermatology appointment. She reports  excessive weeping to her legs bilaterally after the wraps were taken off. She currently denies signs of infection. 12/12/2020 upon evaluation today patient appears to be doing about the same in regard to her wounds. She was actually started on Cipro after having biopsies apparently at her dermatology clinic she does not have the results back from the actual biopsy but the culture did return and apparently they placed her on the Cipro she started that just this morning. Other than that her legs appear to be doing okay at this time. 12/1; patient presents for follow-up. She has no issues or complaints today. She has started Lasix 40 mg daily to help with her bilateral leg swelling. 12/8; patient presents for follow-up. She has no issues or complaints today. 12/15; patient presents for 1 week follow-up.  She has no issues or complaints today. 12/22; patient presents for follow-up. She has no issues or complaints today. 1/5; patient presents for follow-up. She has no issues or complaints today. She has tolerated the compression wraps well. 1/12; patient presents for follow-up. She is tolerated the compression wrap well and has no issues or complaints today. She denies signs of infection. 1/19; patient presents for follow-up. She took the compression wraps off 1 to 2 days ago to take a shower and did not have compression wraps replaced. She reports increased blistering throughout her legs bilaterally. She currently denies signs of infection. 1/26; patient presents for follow-up. She has no issues or complaints today. She tolerated the compression wraps well. She has not heard from oncology to schedule an appointment. 2/2; patient presents for follow-up. She has no issues or complaints today. She continues to tolerate the compression wrap well. 2/16; patient presents for follow-up. She has no issues or complaints today. 2/23; patient presents for follow-up. She has no issues or complaints today. Tomorrow she has an appointment with oncology to look at the suspicious lesion on her right lower extremity. She currently denies signs of infection. 3/2; patient presents for follow-up. She has been using her juxta light compression to the right lower extremity however there has been increased swelling and opening of wounds throughout the legs with weeping. She currently denies signs of infection. She had no issues with the compression wrap to the left lower extremity. 3/16; patient presents for follow-up. She has no issues or complaints today. She states that she has been restarted on infusions for her squamous cell carcinoma of her legs. She has also been increased on Lasix to 40 mg twice daily. She has noticed a decrease in swelling to her legs. 3/23; patient presents for follow-up. She starts her second  infusion next week for her squamous cell carcinoma lesions to her legs. She reports no issues with the compression wraps. 3/30; patient presents for follow-up. She has no issues or complaints today. 4/6; patient presents for follow-up. Her left lower extremity wounds have healed. She has her juxta light compression with her today. She has no issues or complaints today. 4/13; this is a patient we actually discharged to juxta lite stockings on the left leg the last time she was here. She came in on Monday for [place] to look at the leg and a nurse visit. She had massive swelling and numerous blisters and wound reopening. We put her back in 3 layer compression although I did not generate a note. Silver alginate on the wounded areas. We have also been doing the same on the right. In the meantime she is obtained her external compression pumps that  were previously ordered and she started to use them. She has skin cancers that are obvious on the right leg x2 her appointment with Dr. Jarome Matin of dermatology is on May 5.. She is also receiving weekly chemotherapy for recurrent presumably squamous cell carcinomas apparently has had 2 treatments 4/20; patient presents for follow-up. She has her new juxta lite compressions today. She continues to have open wounds to the left lower extremity. She has follow-up with dermatology in 2 weeks. She continues to have IV infusions for her squamous cell carcinoma lesions on her legs. 4/28; the patient has bilateral lower extremity wounds predominantly on the right lateral upper leg, right anterior lower leg which in itself is probably a recurrent cancer as well as the left lateral lower leg. She has recurrent squamous cell carcinoma currently being treated with an IV infusion. She also has scattered nodules on her upper lower legs and thighs I am not certain of all of this is felt to be malignant as well We have been using silver alginate on the open areas wrapping her legs.  She also has her external compression pumps at home but I am not clear that she is going to use these 5/2; patient presents for follow-up. She has not been taking her diuretics as prescribed. She sees Dr. Dian Situ, dermatology at the end of the week. She tolerated the compression wraps well today. She has her juxta lite compressions. 5/8; patient presents for follow-up. She saw Dr. Dian Situ, dermatology and she has nothing to report on. She has been using her juxta lite compression to the right lower extremity. She has tolerated the compression wrap well in the left lower extremity. She has not been taking her diuretic. She is scheduled to continue her infusions for her squamous cell carcinoma on her legs. 5/18; patient presents for follow-up. We wrapped her in 3 layer compression at last clinic visit. She has no issues or complaints today. 5/22; patient presents for follow-up. She has been wrapped in 3 layer compression bilaterally to her lower extremities over the past week. She has been scratching at the top of the wrap and picking at her skin. This area has increased warmth and erythema. 5/30; patient presents for follow-up. She completed Keflex with resolution of her symptoms to the right lower extremity. She no longer has increased warmth or erythema. She continues her infusions for her squamous cell carcinoma lesions. We continue to wrap her in 3 layer compression with silver alginate. She currently denies signs of infection. 6/8; the patient went to see Dr. Ronnald Ramp of dermatology 2 days ago unfortunately we do not have his notes. They removed her compression wraps of course but did not adequately replace them. She comes in today with 3 wounds on the right lateral upper calf 1 medially below the obvious squamous cell carcinoma there is a large necrotic wound on the left lateral tibial area on the left and several blisters. The patient remains on Cemiplimad infusions for the squamous cell carcinoma  bilaterally. She is not felt to be a good candidate for radiation. Some of the skin changes superiorly in the upper legs bilaterally according to Dr. Ronnald Ramp related to these infusions the same like fleshy nodules to me not blisters 6/13; patient presents for follow-up. We have been using silver alginate under 3 layer compression bilaterally. She has no issues or complaints today. 6/19; patient presents for follow-up. She will see her oncologist in 3 days and next week her dermatologist. She has no new complaints  today. We are using silver alginate under 3 layer compression bilaterally. 6/29; patient presents for follow-up. She she saw Dr. Darrin Nipper last week and States she received injections to the lesions on her legs. We will ask for office visit records. She could not be wrapped with compression and has more drainage on exam today. 7/6; patient presents for follow-up. She continues to receive injections to the lesions in her legs by her dermatologist. She had this done earlier today. She has no issues or complaints today. 7/13; patient presents for follow-up. She is following with Dr.Kavuri once weekly for treatment of her SCCs/KAs with [id number]. We have been wrapping her with silver alginate under Kerlix/Coban. She has no issues or complaints today. 7/20; patient presents for follow-up. She has elected to stop doing weekly treatments for her squamous cell carcinoma lesions. She states she experiences too much pain from the injections. She is continuing her immunotherapy infusions. Next 1 is scheduled in August. T the legs we have been using silver alginate o under Kerlix/Coban. Her wounds actually look better today. 7/27; patient presents for follow-up. We have been using Medihoney and silver alginate under compression therapy. She has no issues or complaints today. 8/1; patient presents for follow-up. We have been using Medihoney and silver alginate under compression therapy. She reports no issues.  She is scheduled for her infusion for her SCC later this week. 8/22; patient presents for follow-up. We have been using Medihoney and silver alginate under compression therapy. She reports no issues today. She has moved back home from the skilled nursing facility. She has home health that will be coming out to do OT and PT She would like for them to also do wound . care. 9/18; patient presents for follow-up. We have been using Medihoney and silver alginate under compression therapy. Home health is no longer coming out for wound care. She comes in weekly for wrap changes in our office During nurse visits. 10/2; patient presents for follow-up. We have been using silver alginate under compression therapy. She has no issues or complaints today. She states she is doing her last chemoinfusion next week. Patient History Information obtained from Patient. Family History Unknown History. Social History Never smoker, Marital Status - Single, Alcohol Use - Never, Drug Use - No History, Caffeine Use - Never. Medical History Eyes Denies history of Cataracts, Optic Neuritis Ear/Nose/Mouth/Throat Denies history of Chronic sinus problems/congestion, Middle ear problems Hematologic/Lymphatic Patient has history of Anemia Denies history of Hemophilia, Human Immunodeficiency Virus, Lymphedema, Sickle Cell Disease Respiratory Denies history of Aspiration, Asthma, Chronic Obstructive Pulmonary Disease (COPD), Pneumothorax, Sleep Apnea, Tuberculosis Cardiovascular Patient has history of Arrhythmia - Atrial Flutter, A fibb, Congestive Heart Failure, Hypertension Denies history of Angina, Coronary Artery Disease, Deep Vein Thrombosis, Hypotension, Myocardial Infarction, Peripheral Arterial Disease, Peripheral Venous Disease, Phlebitis, Vasculitis Gastrointestinal Patient has history of Colitis Denies history of Cirrhosis , Crohnoos, Hepatitis A, Hepatitis B, Hepatitis C Endocrine Denies history of Type I  Diabetes, Type II Diabetes Genitourinary Denies history of End Stage Renal Disease Immunological Denies history of Lupus Erythematosus, Raynaudoos, Scleroderma Integumentary (Skin) Denies history of History of Burn Musculoskeletal Patient has history of Osteoarthritis Denies history of Gout, Rheumatoid Arthritis, Osteomyelitis Neurologic Denies history of Dementia, Neuropathy, Quadriplegia, Paraplegia, Seizure Disorder Oncologic Denies history of Received Chemotherapy, Received Radiation Hospitalization/Surgery History - removal of rod left leg. 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 Vitals Time Taken: 1:40 PM, Height: 65 in, Weight: 163 lbs, BMI: 27.1, Temperature: 97.5 F, Pulse: 86 bpm, Respiratory Rate: 20 breaths/min, Blood Pressure: 144/84 mmHg. Cardiovascular 2+ dorsalis pedis/posterior tibialis pulses. Psychiatric pleasant and cooperative. General Notes: Multiple scattered open wounds to her lower extremities bilaterally limited to skin breakdown. 3 areas suspicious for malignancy. No surrounding signs of infection. Adequate edema control. Integumentary (Hair, Skin) Wound #20 status is Open. Original cause of wound was Gradually Appeared. The date acquired was: 06/25/2021. The wound has been in treatment 16 weeks. The wound is located on the Right,Circumferential Lower Leg. The wound measures 16.5cm length x 15cm width x 0.1cm depth; 194.386cm^2 area and 19.439cm^3 volume. There is Fat Layer (Subcutaneous Tissue) exposed. There is no tunneling or undermining noted. There is a medium amount of serosanguineous drainage noted. The wound margin is distinct with the outline attached to the wound base. There is large (67-100%) red, pink granulation  within the wound bed. There is a small (1-33%) amount of necrotic tissue within the wound bed including Adherent Slough. The periwound skin appearance exhibited: Dry/Scaly, Hemosiderin Staining. The periwound skin appearance did not exhibit: Callus, Crepitus, Excoriation, Induration, Rash, Scarring, Maceration, Atrophie Blanche, Cyanosis, Ecchymosis, Mottled, Pallor, Rubor, Erythema. Periwound temperature was noted as No Abnormality. The periwound has tenderness on palpation. Wound #7RR status is Open. Original cause of wound was Blister. The date acquired was: 04/20/2020. The wound has been in treatment 71 weeks. The wound is located on the Left,Circumferential Lower Leg. The wound measures 11cm length x 11cm width x 0.1cm depth; 95.033cm^2 area and 9.503cm^3 volume. There is Fat Layer (Subcutaneous Tissue) exposed. There is no tunneling or undermining noted. There is a medium amount of serosanguineous drainage noted. The wound margin is distinct with the outline attached to the wound base. There is large (67-100%) pink, pale granulation within the wound bed. There is no necrotic tissue within the wound bed. The periwound skin appearance exhibited: Dry/Scaly, Hemosiderin Staining. The periwound skin appearance did not exhibit: Callus, Crepitus, Excoriation, Induration, Rash, Scarring, Maceration, Atrophie Blanche, Cyanosis, Ecchymosis, Mottled, Pallor, Rubor, Erythema. Periwound temperature was noted as No Abnormality. The periwound has tenderness on palpation. Assessment Active Problems ICD-10 Squamous cell carcinoma of skin, unspecified Non-pressure chronic ulcer of other part of right lower leg with fat layer exposed Non-pressure chronic ulcer of other part of left lower leg with fat layer exposed Lymphedema, not elsewhere classified Chronic venous hypertension (idiopathic) with ulcer of bilateral lower extremity Patient's wounds are stable. I recommended continuing the course of silver  alginate under compression therapy. This is a palliative care case. Plan 1. Silver alginate under 3 layer compression 2. Follow-up in 1 week Electronic Signature(s) Signed: 10/21/2021 4:32:49 PM By: Kalman Shan DO Entered By: Kalman Shan on 10/21/2021 14:12:48 -------------------------------------------------------------------------------- HxROS Details Patient Name: Date of Service: [name], [place]. 10/21/2021 1:30 PM Medical Record Number: [id number] Patient Account Number: [id number] Date of Birth/Sex: Treating RN: 1925-06-25 (88 y.o. [name] Primary Care Provider: [name] Other Clinician: Referring Provider: Treating Provider/Extender: [name] in Treatment: 80 Information Obtained From Patient Eyes Medical History: Negative for: Cataracts; Optic Neuritis Ear/Nose/Mouth/Throat Medical History: Negative for: Chronic sinus problems/congestion; Middle ear problems Hematologic/Lymphatic Medical History: Positive for: Anemia Negative for: Hemophilia; Human Immunodeficiency Virus; Lymphedema; Sickle Cell Disease Respiratory Medical History: Negative for: Aspiration; Asthma; Chronic Obstructive Pulmonary Disease (COPD); Pneumothorax; Sleep Apnea; Tuberculosis Cardiovascular Medical History: Positive for: Arrhythmia - Atrial Flutter, A  fibb; Congestive Heart Failure; Hypertension Negative for: Angina; Coronary Artery Disease; Deep Vein Thrombosis; Hypotension; Myocardial Infarction; Peripheral Arterial Disease; Peripheral Venous Disease; Phlebitis; Vasculitis Past Medical History Notes: hyperlipidemia Gastrointestinal Medical History: Positive for: Colitis Negative for: Cirrhosis ; Crohns; Hepatitis A; Hepatitis B; Hepatitis C Endocrine Medical History: Negative for: Type I Diabetes; Type II Diabetes Past Medical History Notes: hypothyroidism Genitourinary Medical History: Negative for: End Stage Renal  Disease Immunological Medical History: Negative for: Lupus Erythematosus; Raynauds; Scleroderma Integumentary (Skin) Medical History: Negative for: History of Burn Musculoskeletal Medical History: Positive for: Osteoarthritis Negative for: Gout; Rheumatoid Arthritis; Osteomyelitis Neurologic Medical History: Negative for: Dementia; Neuropathy; Quadriplegia; Paraplegia; Seizure Disorder Past Medical History Notes: lumbar spindylolysis Oncologic Medical History: Negative for: Received Chemotherapy; Received Radiation Past Medical History Notes: BLE squamous ceel carcionoma Immunizations Pneumococcal Vaccine: Received Pneumococcal Vaccination: Yes Received Pneumococcal Vaccination On or After 60th Birthday: No Implantable Devices None Hospitalization / Surgery History Type of Hospitalization/Surgery removal of rod left leg Family and Social History Unknown History: Yes; Never smoker; Marital Status - Single; Alcohol Use: Never; Drug Use: No History; Caffeine Use: Never; Financial Concerns: No; Food, Clothing or Shelter Needs: No; Support System Lacking: No; Transportation Concerns: No Electronic Signature(s) Signed: 10/21/2021 4:32:49 PM By: Kalman Shan DO Signed: 10/21/2021 4:42:08 PM By: Deon Pilling RN, BSN Entered By: Kalman Shan on 10/21/2021 14:08:56 -------------------------------------------------------------------------------- [place] Details Patient Name: Date of Service: [name], [place] A. 10/21/2021 Medical Record Number: [id number] Patient Account Number: [id number] Date of Birth/Sex: Treating RN: [birth year] (88 y.o. [name], [name] Primary Care Provider: [name] Other Clinician: Referring Provider: Treating Provider/Extender: [name] in Treatment: 55 Diagnosis Coding ICD-10 Codes Code Description C44.92 Squamous cell carcinoma of skin, unspecified L97.812 Non-pressure chronic ulcer of other  part of right lower leg with fat layer exposed L97.822 Non-pressure chronic ulcer of other part of left lower leg with fat layer exposed I89.0 Lymphedema, not elsewhere classified I87.313 Chronic venous hypertension (idiopathic) with ulcer of bilateral lower extremity Facility Procedures CPT4: CPT4 Description Modifier Quantity Code [id number] BILATERAL: Application of multi-layer venous compression system; leg (below knee), including ankle and 1 foot. Physician Procedures : CPT4 Code Description Modifier [id number] - WC PHYS LEVEL 3 - EST PT ICD-10 Diagnosis Description C44.92 Squamous cell carcinoma of skin, unspecified L97.812 Non-pressure chronic ulcer of other part of right lower leg with fat layer exposed L97.822  Non-pressure chronic ulcer of other part of left lower leg with fat layer exposed I87.313 Chronic venous hypertension (idiopathic) with ulcer of bilateral lower extremity Quantity: 1 Electronic Signature(s) Signed: 10/21/2021 4:32:49 PM By: Kalman Shan DO Signed: 10/21/2021 4:42:08 PM By: Deon Pilling RN, BSN Entered By: Deon Pilling on 10/21/2021 14:21:36

## 2021-10-22 ENCOUNTER — Other Ambulatory Visit: Payer: Self-pay

## 2021-10-23 DIAGNOSIS — I5030 Unspecified diastolic (congestive) heart failure: Secondary | ICD-10-CM | POA: Diagnosis not present

## 2021-10-23 DIAGNOSIS — R2689 Other abnormalities of gait and mobility: Secondary | ICD-10-CM | POA: Diagnosis not present

## 2021-10-24 ENCOUNTER — Inpatient Hospital Stay: Payer: Medicare Other | Attending: Oncology | Admitting: Oncology

## 2021-10-24 ENCOUNTER — Inpatient Hospital Stay: Payer: Medicare Other

## 2021-10-24 ENCOUNTER — Other Ambulatory Visit: Payer: Self-pay

## 2021-10-24 VITALS — BP 113/52 | HR 59 | Resp 17

## 2021-10-24 DIAGNOSIS — C44721 Squamous cell carcinoma of skin of unspecified lower limb, including hip: Secondary | ICD-10-CM | POA: Diagnosis not present

## 2021-10-24 DIAGNOSIS — Z79899 Other long term (current) drug therapy: Secondary | ICD-10-CM | POA: Insufficient documentation

## 2021-10-24 DIAGNOSIS — Z5112 Encounter for antineoplastic immunotherapy: Secondary | ICD-10-CM | POA: Diagnosis not present

## 2021-10-24 DIAGNOSIS — C4490 Unspecified malignant neoplasm of skin, unspecified: Secondary | ICD-10-CM

## 2021-10-24 LAB — CBC WITH DIFFERENTIAL (CANCER CENTER ONLY)
Abs Immature Granulocytes: 0.01 10*3/uL (ref 0.00–0.07)
Basophils Absolute: 0.1 10*3/uL (ref 0.0–0.1)
Basophils Relative: 1 %
Eosinophils Absolute: 0.2 10*3/uL (ref 0.0–0.5)
Eosinophils Relative: 2 %
HCT: 33.6 % — ABNORMAL LOW (ref 36.0–46.0)
Hemoglobin: 11 g/dL — ABNORMAL LOW (ref 12.0–15.0)
Immature Granulocytes: 0 %
Lymphocytes Relative: 14 %
Lymphs Abs: 0.9 10*3/uL (ref 0.7–4.0)
MCH: 34.1 pg — ABNORMAL HIGH (ref 26.0–34.0)
MCHC: 32.7 g/dL (ref 30.0–36.0)
MCV: 104 fL — ABNORMAL HIGH (ref 80.0–100.0)
Monocytes Absolute: 0.9 10*3/uL (ref 0.1–1.0)
Monocytes Relative: 13 %
Neutro Abs: 4.7 10*3/uL (ref 1.7–7.7)
Neutrophils Relative %: 70 %
Platelet Count: 268 10*3/uL (ref 150–400)
RBC: 3.23 MIL/uL — ABNORMAL LOW (ref 3.87–5.11)
RDW: 13.9 % (ref 11.5–15.5)
WBC Count: 6.7 10*3/uL (ref 4.0–10.5)
nRBC: 0 % (ref 0.0–0.2)

## 2021-10-24 LAB — CMP (CANCER CENTER ONLY)
ALT: 10 U/L (ref 0–44)
AST: 27 U/L (ref 15–41)
Albumin: 3.6 g/dL (ref 3.5–5.0)
Alkaline Phosphatase: 68 U/L (ref 38–126)
Anion gap: 5 (ref 5–15)
BUN: 22 mg/dL (ref 8–23)
CO2: 32 mmol/L (ref 22–32)
Calcium: 9 mg/dL (ref 8.9–10.3)
Chloride: 101 mmol/L (ref 98–111)
Creatinine: 1.29 mg/dL — ABNORMAL HIGH (ref 0.44–1.00)
GFR, Estimated: 39 mL/min — ABNORMAL LOW (ref >60)
Glucose, Bld: 143 mg/dL — ABNORMAL HIGH (ref 70–99)
Potassium: 3.7 mmol/L (ref 3.5–5.1)
Sodium: 138 mmol/L (ref 135–145)
Total Bilirubin: 0.7 mg/dL (ref 0.3–1.2)
Total Protein: 7.7 g/dL (ref 6.5–8.1)

## 2021-10-24 LAB — TSH: TSH: 0.635 u[IU]/mL (ref 0.350–4.500)

## 2021-10-24 MED ORDER — SODIUM CHLORIDE 0.9 % IV SOLN: Freq: Once | INTRAVENOUS | Status: AC

## 2021-10-24 MED ORDER — SODIUM CHLORIDE 0.9 % IV SOLN
350.0000 mg | Freq: Once | INTRAVENOUS | Status: AC
  Administered 2021-10-24: 350 mg via INTRAVENOUS
  Filled 2021-10-24: qty 7

## 2021-10-24 NOTE — Progress Notes (Signed)
Hematology and Oncology Follow Up Visit  LIBA HULSEY [id number] December 22, 1925 88 y.o. 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  She developed relapsed disease in March 2023.    Prior Therapy:  She received Libtayo for 5 cycles concluded in September 2022.  Current therapy:  Libtayo 350 mg total dose restarted on March 26, 2021.  He is here for cycle 11 of therapy.  Interim History: Ms. Molner presents today for a follow-up visit.  Since last visit, she reports no major changes in her health.  She denies any recent hospitalizations or illnesses.  She denies excessive fatigue or tiredness.  He denies any worsening skin rash or lesions.  She continues to have lower extremity edema and nonhealing ulcers and follow-up at the wound clinic.  She denies any change in her bowel habits or any complications related to Libtayo.    Medications: Reviewed without changes. Current Outpatient Medications  Medication Sig Dispense Refill   acetaminophen (TYLENOL) 500 MG tablet Take 500 mg by mouth daily as needed for mild pain.      apixaban (ELIQUIS) 5 MG TABS tablet Take 1 tablet (5 mg total) by mouth 2 (two) times daily. NEEDS CARDIOLOGY APPOINTMENT FOR ELIQUIS REFILLS, PLEASE CALL OFFICE 60 tablet 0   B Complex-C (B-COMPLEX WITH VITAMIN C) tablet Take 1 tablet by mouth daily.     busPIRone (BUSPAR) 15 MG tablet TAKE 1 TABLET BY MOUTH THREE TIMES A DAY 90 tablet 3   CEMIPLIMAB-RWLC IV Inject 1 Dose into the vein every 21 ( twenty-one) days.     Cholecalciferol (VITAMIN D3) 1000 UNITS tablet Take 1,000 Units by mouth daily.     colestipol (COLESTID) 1 g tablet Take 1 tablet (1 g total) by mouth in the morning. (Patient not taking: Reported on 09/17/2021) 60 tablet 12   diltiazem (CARDIZEM CD) 120 MG 24 hr capsule TAKE 1 CAPSULE BY MOUTH ONCE DAILY 30 capsule 11    doxycycline (VIBRA-TABS) 100 MG tablet TAKE 1 TABLET BY MOUTH ONCE DAILY 30 tablet 11   famotidine (PEPCID) 20 MG tablet Take 20 mg by mouth daily.     furosemide (LASIX) 40 MG tablet Take 1 tablet (40 mg total) by mouth 2 (two) times daily. 60 tablet 5   gabapentin (NEURONTIN) 100 MG capsule TAKE 1 CAPSULE BY MOUTH EVERY NIGHT AT BEDTIME *DO NOT CRUSH OR CHEW* **NOTE NEW INSTRUCTIONS* 30 capsule 5   hydrocortisone 2.5 % lotion Apply topically 3 (three) times daily. 240 mL 2   levothyroxine (SYNTHROID) 25 MCG tablet TAKE 1 TABLET BY MOUTH ONCE DAILY FOR THYROID *TAKE ON AN EMPTY STOMACH* 30 tablet 11   lidocaine-prilocaine (EMLA) cream Apply 1 Application topically as needed. As directed 1 h before procedure 30 g 2   metoprolol succinate (TOPROL-XL) 50 MG 24 hr tablet TAKE 1/2 TABLET = 25 MG BY MOUTH TWICE DAILY. TAKE WITH OR IMMEDIATELY FOLLOWING A MEAL 30 tablet 10   Multiple Vitamins-Minerals (MULTIVITAMIN WITH MINERALS) tablet Take 1 tablet by mouth daily.     polyethylene glycol (MIRALAX) 17 g packet Take 17 g by mouth daily. 30 each 0   potassium chloride SA (KLOR-CON M) 20 MEQ tablet TAKE 1 TABLET BY MOUTH ONCE DAILY  ALONG W/ FUROSEMIDE AS NEED *TAKE WITH FOOD* *DO NOT CRUSH OR CHEW* *MAY DISSOLVE* 30 tablet 11   temazepam (RESTORIL) 15 MG capsule TAKE  1 CAPSULE BY MOUTH AT BEDTIME AS NEEDED FOR SLEEP 30 capsule 5   No current facility-administered medications for this visit.     Allergies:  Allergies  Allergen Reactions   Zosyn [Piperacillin Sod-Tazobactam So] Hives, Itching, Rash and Other (See Comments)    Morbilliform eruptions with itching- looks like Measles   Atorvastatin     myalgias   Zoloft [Sertraline]     Diarrhea   Atenolol Nausea And Vomiting   Clarithromycin Nausea And Vomiting   Codeine Sulfate Nausea Only   Levaquin [Levofloxacin] Nausea Only   Macrodantin Other (See Comments)    UNSPECIFIED    Oxycodone-Acetaminophen Nausea And Vomiting      Physical  Exam:      Blood pressure (!) 120/58, pulse 89, temperature 98.1 F (36.7 C), temperature source Temporal, resp. rate 16, height '5\' 5"'$  (1.651 m), weight 150 lb 11.2 oz (68.4 kg), SpO2 97 %.     ECOG: 2    General appearance: Comfortable appearing without any discomfort Head: Normocephalic without any trauma Oropharynx: Mucous membranes are moist and pink without any thrush or ulcers. Eyes: Pupils are equal and round reactive to light. Lymph nodes: No cervical, supraclavicular, inguinal or axillary lymphadenopathy.   Heart:regular rate and rhythm.  S1 and S2 without leg edema. Lung: Clear without any rhonchi or wheezes.  No dullness to percussion. Abdomin: Soft, nontender, nondistended with good bowel sounds.  No hepatosplenomegaly. Musculoskeletal: No joint deformity or effusion.  Full range of motion noted. Neurological: No deficits noted on motor, sensory and deep tendon reflex exam. Skin: Lower extremity raised scaly lesions noted without any erythema or induration.       Lab Results: Lab Results  Component Value Date   WBC 5.1 10/04/2021   HGB 10.6 (L) 10/04/2021   HCT 33.2 (L) 10/04/2021   MCV 105.7 (H) 10/04/2021   PLT 242 10/04/2021     Chemistry      Component Value Date/Time   NA 138 10/04/2021 1132   NA 134 08/11/2018 1425   NA 137 07/28/2016 1334   K 4.1 10/04/2021 1132   K 3.8 07/28/2016 1334   CL 104 10/04/2021 1132   CO2 27 10/04/2021 1132   CO2 28 07/28/2016 1334   BUN 19 10/04/2021 1132   BUN 20 08/11/2018 1425   BUN 9.5 07/28/2016 1334   CREATININE 0.97 10/04/2021 1132   CREATININE 1.0 07/28/2016 1334      Component Value Date/Time   CALCIUM 8.9 10/04/2021 1132   CALCIUM 9.6 07/28/2016 1334   ALKPHOS 68 10/04/2021 1132   ALKPHOS 96 07/28/2016 1334   AST 32 10/04/2021 1132   AST 21 07/28/2016 1334   ALT 15 10/04/2021 1132   ALT 24 07/28/2016 1334   BILITOT 0.7 10/04/2021 1132   BILITOT 1.21 (H) 07/28/2016 1334          Impression and Plan:  88 year old with:  1.  Recurrent squamous cell carcinoma of the skin involving the lower extremity noted in March 2023.      She is currently receiving Libtayo for salvage purposes and has tolerated it very well.  She had a reasonable response to therapy although it is unclear how much response has been documented given the nonhealed wounds she has in her lower extremities.  At this time, I have recommended to suspend the Libtayo therapy and continue observation after today's treatment.  She will continue to follow with dermatology and if documented relapse is noted this could be restarted in  the future.  She did have a presumable good response based on dermatology input.  Risks and benefits of receiving Libtayo today were discussed.  Autoimmune complications, GI toxicity among others were reviewed.  After discussion today,   2.  Breast cancer: No evidence of relapsed disease at this time.  3.  Nonhealing wounds in the lower extremities: She is following at the wound clinic without any recent exacerbation.     4.  Follow-up: In 6 months for repeat follow-up.      30 minutes were spent on this visit.  The time was dedicated to reviewing her disease status, treatment choices and outlining future plan of care review.   Zola Button, MD 10/5/202311:23 AM

## 2021-10-24 NOTE — Patient Instructions (Signed)
[facility] MEDICAL ONCOLOGY  Discharge Instructions: Thank you for choosing [facility] to provide your oncology and hematology care.   If you have a lab appointment with the Cancer Center, please go directly to the Cancer Center and check in at the registration area.   Wear comfortable clothing and clothing appropriate for easy access to any Portacath or PICC line.   We strive to give you quality time with your provider. You may need to reschedule your appointment if you arrive late (15 or more minutes).  Arriving late affects you and other patients whose appointments are after yours.  Also, if you miss three or more appointments without notifying the office, you may be dismissed from the clinic at the provider's discretion.      For prescription refill requests, have your pharmacy contact our office and allow 72 hours for refills to be completed.    Today you received the following chemotherapy and/or immunotherapy agents: Libtayo      To help prevent nausea and vomiting after your treatment, we encourage you to take your nausea medication as directed.  BELOW ARE SYMPTOMS THAT SHOULD BE REPORTED IMMEDIATELY: *FEVER GREATER THAN 100.4 F (38 C) OR HIGHER *CHILLS OR SWEATING *NAUSEA AND VOMITING THAT IS NOT CONTROLLED WITH YOUR NAUSEA MEDICATION *UNUSUAL SHORTNESS OF BREATH *UNUSUAL BRUISING OR BLEEDING *URINARY PROBLEMS (pain or burning when urinating, or frequent urination) *BOWEL PROBLEMS (unusual diarrhea, constipation, pain near the anus) TENDERNESS IN MOUTH AND THROAT WITH OR WITHOUT PRESENCE OF ULCERS (sore throat, sores in mouth, or a toothache) UNUSUAL RASH, SWELLING OR PAIN  UNUSUAL VAGINAL DISCHARGE OR ITCHING   Items with * indicate a potential emergency and should be followed up as soon as possible or go to the Emergency Department if any problems should occur.  Please show the CHEMOTHERAPY ALERT CARD or IMMUNOTHERAPY ALERT CARD at check-in to  the Emergency Department and triage nurse.  Should you have questions after your visit or need to cancel or reschedule your appointment, please contact [facility]  Dept: [phone]  and follow the prompts.  Office hours are 8:00 a.m. to 4:30 p.m. Monday - Friday. Please note that voicemails left after 4:00 p.m. may not be returned until the following business day.  We are closed weekends and major holidays. You have access to a nurse at all times for urgent questions. Please call the main number to the clinic Dept: [phone] and follow the prompts.   For any non-urgent questions, you may also contact your provider using MyChart. We now offer e-Visits for anyone 18 and older to request care online for non-urgent symptoms. For details visit [email or web address].   Also download the MyChart app! Go to the app store, search "MyChart", open the app, select , and log in with your MyChart username and password.  Masks are optional in the cancer centers. If you would like for your care team to wear a mask while they are taking care of you, please let them know. You may have one support person who is at least 86 years old accompany you for your appointments. 

## 2021-10-25 DIAGNOSIS — I5030 Unspecified diastolic (congestive) heart failure: Secondary | ICD-10-CM | POA: Diagnosis not present

## 2021-10-25 DIAGNOSIS — Z23 Encounter for immunization: Secondary | ICD-10-CM | POA: Diagnosis not present

## 2021-10-25 DIAGNOSIS — R2689 Other abnormalities of gait and mobility: Secondary | ICD-10-CM | POA: Diagnosis not present

## 2021-10-25 LAB — T4: T4, Total: 6.6 ug/dL (ref 4.5–12.0)

## 2021-10-26 ENCOUNTER — Other Ambulatory Visit: Payer: Self-pay

## 2021-10-28 ENCOUNTER — Encounter (HOSPITAL_BASED_OUTPATIENT_CLINIC_OR_DEPARTMENT_OTHER): Payer: Medicare Other | Admitting: Internal Medicine

## 2021-10-28 DIAGNOSIS — L97812 Non-pressure chronic ulcer of other part of right lower leg with fat layer exposed: Secondary | ICD-10-CM | POA: Diagnosis not present

## 2021-10-28 DIAGNOSIS — M1990 Unspecified osteoarthritis, unspecified site: Secondary | ICD-10-CM | POA: Diagnosis not present

## 2021-10-28 DIAGNOSIS — I110 Hypertensive heart disease with heart failure: Secondary | ICD-10-CM | POA: Diagnosis not present

## 2021-10-28 DIAGNOSIS — L97822 Non-pressure chronic ulcer of other part of left lower leg with fat layer exposed: Secondary | ICD-10-CM | POA: Diagnosis not present

## 2021-10-28 DIAGNOSIS — I890 Lymphedema, not elsewhere classified: Secondary | ICD-10-CM | POA: Diagnosis not present

## 2021-10-28 DIAGNOSIS — I872 Venous insufficiency (chronic) (peripheral): Secondary | ICD-10-CM | POA: Diagnosis not present

## 2021-10-28 NOTE — Progress Notes (Signed)
[name], [name] ([id number]) [phone].pdf Page 1 of 4 Visit Report for 09/26/2021 Arrival Information Details Patient Name: Date of Service: [name], [place]. 09/26/2021 1:45 PM Medical Record Number: [id number] Patient Account Number: [id number] Date of Birth/Sex: Treating RN: [birth year] (88 y.o. [name], [name] Primary Care Teosha Casso: [name] Other Clinician: Referring Phelan Schadt: Treating Dalexa Gentz/Extender: [name] in Treatment: 51 Visit Information History Since Last Visit Added or deleted any medications: No Patient Arrived: Wheel Chair Any new allergies or adverse reactions: No Arrival Time: 13:30 Had Bell fall or experienced change in No Accompanied By: self activities of daily living that may affect Transfer Assistance: Manual risk of falls: Patient Identification Verified: Yes Signs or symptoms of abuse/neglect since last visito No Secondary Verification Process Completed: Yes Hospitalized since last visit: No Patient Requires Transmission-Based No Implantable device outside of the clinic excluding No Precautions: cellular tissue based products placed in the center Patient Has Alerts: Yes since last visit: Patient Alerts: R ABI = Non compressible Has Dressing in Place as Prescribed: Yes L ABI = Non Has Compression in Place as Prescribed: Yes compressible Pain Present Now: No Electronic Signature(s) Signed: 09/26/2021 4:40:53 PM By: Deon Pilling RN, BSN Entered By: Deon Pilling on 09/26/2021 14:03:26 -------------------------------------------------------------------------------- Compression Therapy Details Patient Name: Date of Service: [name], [name]. 09/26/2021 1:45 PM Medical Record Number: [id number] Patient Account Number: [id number] Date of Birth/Sex: Treating RN: 03/26/1925 (88 y.o. [name] Primary Care Teisha Trowbridge: [name] Other Clinician: Referring Majesty Stehlin: Treating  Shauntel Prest/Extender: [name] in Treatment: 72 Compression Therapy Performed for Wound Assessment: Wound #20 Right,Circumferential Lower Leg Performed By: Clinician Deon Pilling, RN Compression Type: Three Hydrologist) Signed: 09/26/2021 4:40:53 PM By: Deon Pilling RN, BSN Entered By: Deon Pilling on 09/26/2021 14:03:39 -------------------------------------------------------------------------------- Compression Therapy Details Patient Name: Date of Service: [name], [place]. 09/26/2021 1:45 PM Medical Record Number: [id number] Patient Account Number: [id number] [name], [name] ([id number]) [phone].pdf Page 2 of 4 Date of Birth/Sex: Treating RN: 14-Sep-1925 (88 y.o. [name] Primary Care Manasi Dishon: Other Clinician: [name] Referring Lucrezia Dehne: Treating Trayveon Beckford/Extender: [name] in Treatment: 58 Compression Therapy Performed for Wound Assessment: Wound #7RR Left,Circumferential Lower Leg Performed By: Clinician Deon Pilling, RN Compression Type: Three Hydrologist) Signed: 09/26/2021 4:40:53 PM By: Deon Pilling RN, BSN Entered By: Deon Pilling on 09/26/2021 14:03:39 -------------------------------------------------------------------------------- Encounter Discharge Information Details Patient Name: Date of Service: [name], [place]. 09/26/2021 1:45 PM Medical Record Number: [id number] Patient Account Number: [id number] Date of Birth/Sex: Treating RN: [birth year] (88 y.o. [name] Primary Care Ameen Mostafa: [name] Other Clinician: Referring Karianne Nogueira: Treating Opie Maclaughlin/Extender: [name] in Treatment: 15 Encounter Discharge Information Items Discharge Condition: Stable Ambulatory Status: Wheelchair Discharge Destination: Home Transportation: Private Auto Accompanied By: self Schedule  Follow-up Appointment: Yes Clinical Summary of Care: Electronic Signature(s) Signed: 09/26/2021 4:40:53 PM By: Deon Pilling RN, BSN Entered By: Deon Pilling on 09/26/2021 14:04:11 -------------------------------------------------------------------------------- Wound Assessment Details Patient Name: Date of Service: [name], [place]. 09/26/2021 1:45 PM Medical Record Number: [id number] Patient Account Number: [id number] Date of Birth/Sex: Treating RN: 1925/06/13 (88 y.o. [name] Primary Care Leemon Ayala: [name] Other Clinician: Referring Margeart Allender: Treating Sevan Mcbroom/Extender: [name] in Treatment: 97 Wound Status Wound Number: 20 Primary Etiology: Lymphedema Wound Location: Right, Circumferential Lower Leg Wound Status: Open Wounding Event: Gradually Appeared Date Acquired: 06/25/2021  Weeks Of Treatment: 13 Clustered Wound: Yes Wound Measurements Length: (cm) 1.6 Width: (cm) 0.7 Depth: (cm) 0.1 Area: (cm) 0.88 Volume: (cm) 0.088 [name], [name] ([id number]) % Reduction in Area: 99.8% % Reduction in Volume: 99.8% [id number] Page 3 of 4 Wound Description Classification: Full Thickness Without Exposed Suppor Exudate Amount: Medium Exudate Type: Serosanguineous Exudate Color: red, brown t Structures Electronic Signature(s) Signed: 09/26/2021 4:40:53 PM By: Deon Pilling RN, BSN Entered By: Deon Pilling on 09/26/2021 14:02:55 -------------------------------------------------------------------------------- Wound Assessment Details Patient Name: Date of Service: [name], [place]. 09/26/2021 1:45 PM Medical Record Number: [id number] Patient Account Number: [id number] Date of Birth/Sex: Treating RN: 03-21-1925 (88 y.o. [name], [name] Primary Care Ezariah Nace: [name] Other Clinician: Referring Aymee Fomby: Treating Hailie Searight/Extender: [name] in Treatment:  27 Wound Status Wound Number: [id number] Primary Etiology: Malignant Wound Wound Location: Left, Circumferential Lower Leg Wound Status: Open Wounding Event: Blister Date Acquired: 04/20/2020 Weeks Of Treatment: 67 Clustered Wound: Yes Wound Measurements Length: (cm) 1.6 Width: (cm) 0.7 Depth: (cm) 0.1 Area: (cm) 0.88 Volume: (cm) 0.088 % Reduction in Area: 71.3% % Reduction in Volume: 71.3% Wound Description Classification: Full Thickness Without Exposed Suppor Exudate Amount: Medium Exudate Type: Serosanguineous Exudate Color: red, brown t Structures Electronic Signature(s) Signed: 09/26/2021 4:40:53 PM By: Deon Pilling RN, BSN Entered By: Deon Pilling on 09/26/2021 14:02:55 -------------------------------------------------------------------------------- Vitals Details Patient Name: Date of Service: [name], [place]. 09/26/2021 1:45 PM Medical Record Number: [id number] Patient Account Number: [id number] Date of Birth/Sex: Treating RN: [birth year] (88 y.o. [name] Primary Care Korin Hartwell: [name] Other Clinician: Referring Valerian Jewel: Treating Zuzanna Maroney/Extender: [name] in Treatment: 25 Vital Signs Time Taken: 13:30 Temperature (F): 98.7 [name], [name] ([id number]) [phone].pdf Page 4 of 4 Height (in): 65 Pulse (bpm): 74 Weight (lbs): 163 Respiratory Rate (breaths/min): 17 Body Mass Index (BMI): 27.1 Blood Pressure (mmHg): 117/77 Reference Range: 80 - 120 mg / dl Electronic Signature(s) Signed: 10/28/2021 11:35:58 AM By: Rhae Hammock RN Entered By: Rhae Hammock on 09/26/2021 13:33:05

## 2021-10-29 NOTE — Progress Notes (Signed)
TAMURA, LASKY [name] ([id number]) [id number] Page 1 of 5 Visit Report for 10/28/2021 Arrival Information Details Patient Name: Date of Service: [name], [place] 10/28/2021 1:15 PM Medical Record Number: [id number] Patient Account Number: [id number] Date of Birth/Sex: Treating RN: 05/31/1925 (88 y.o. F) Primary Care [name]: [name] Other Clinician: Referring [name]: Treating [name]/Extender: [name] in Treatment: 8 Visit Information History Since Last Visit Added or deleted any medications: No Patient Arrived: Walker Any new allergies or adverse reactions: No Arrival Time: 13:33 Had [name] fall or experienced change in No Accompanied By: self activities of daily living that may affect Transfer Assistance: None risk of falls: Patient Requires Transmission-Based No Signs or symptoms of abuse/neglect since last visito No Precautions: Hospitalized since last visit: No Patient Has Alerts: Yes Implantable device outside of the clinic excluding No Patient Alerts: R ABI = Non compressible cellular tissue based products placed in the center L ABI = Non since last visit: compressible Has Compression in Place as Prescribed: Yes Pain Present Now: No Electronic Signature(s) Signed: 10/29/2021 8:52:44 AM By: [name] Entered By: [name] on 10/28/2021 13:34:07 -------------------------------------------------------------------------------- Compression Therapy Details Patient Name: Date of Service: [name], [name]. 10/28/2021 1:15 PM Medical Record Number: [id number] Patient Account Number: [id number] Date of Birth/Sex: Treating RN: [birth year] (88 y.o. F) Primary Care [name]: [name] Other Clinician: Referring [name]: Treating Ellarae Nevitt/Extender: [name] in Treatment: 73 Compression Therapy Performed for Wound Assessment: Wound #20 [name],Circumferential  Lower Leg Performed By: Clinician , Electronic Signature(s) Signed: 10/29/2021 8:52:44 AM By: [name] Entered By: [name] on 10/28/2021 14:35:27 -------------------------------------------------------------------------------- Compression Therapy Details Patient Name: Date of Service: [name], [name]. 10/28/2021 1:15 PM Medical Record Number: [id number] Patient Account Number: [id number] Date of Birth/Sex: Treating RN: 1925/12/16 (88 y.o. F) Primary Care Burnetta Kohls: [name] Other Clinician: AVO, [name] ([id number]) [id number] Page 2 of 5 Referring [name]: Treating [name]/Extender: [name] in Treatment: 72 Compression Therapy Performed for Wound Assessment: Wound #7RR Left,Circumferential Lower Leg Performed By: Clinician , Electronic Signature(s) Signed: 10/29/2021 8:52:44 AM By: [name] Entered By: [name] on 10/28/2021 14:35:28 -------------------------------------------------------------------------------- Encounter Discharge Information Details Patient Name: Date of Service: [name], [name] 10/28/2021 1:15 PM Medical Record Number: [id number] Patient Account Number: [id number] Date of Birth/Sex: Treating RN: 18-Jan-1926 (88 y.o. F) Primary Care [name]: [name] Other Clinician: Referring [name]: Treating [name]/Extender: [name] in Treatment: 46 Encounter Discharge Information Items Discharge Condition: Stable Ambulatory Status: Ambulatory Discharge Destination: Home Transportation: Private Auto Accompanied By: self Schedule Follow-up Appointment: Yes Clinical Summary of Care: Electronic Signature(s) Signed: 10/29/2021 8:52:44 AM By: [name] Entered By: [name] on 10/28/2021 14:36:18 -------------------------------------------------------------------------------- Patient/Caregiver Education  Details Patient Name: Date of Service: [name], [name]. 10/9/2023andnbsp1:15 PM Medical Record Number: [id number] Patient Account Number: [id number] Date of Birth/Gender: Treating RN: 16-Nov-1925 (88 y.o. F) Primary Care Physician: [name] Other Clinician: Referring Physician: Treating Physician/Extender: [name] in Treatment: 17 Education Assessment Education Provided To: Patient Education Topics Provided Electronic Signature(s) Signed: 10/29/2021 8:52:44 AM By: [name] Entered By: [name] on 10/28/2021 14:35:59 [name] ([id number]) [id number] Page 3 of 5 -------------------------------------------------------------------------------- Wound Assessment Details Patient Name: Date of Service: [name], [name]. 10/28/2021 1:15 PM Medical Record Number: [id number] Patient Account Number: [id number] Date of Birth/Sex: Treating RN: 09/15/1925 (88 y.o. F) Primary Care [name]: [name] Dawes  [name] Other Clinician: Referring [name]: Treating [name]/Extender: [name] in Treatment: 72 Wound Status Wound Number: 20 Primary Etiology: Lymphedema Wound Location: [name], Circumferential Lower Leg Wound Status: Open Wounding Event: Gradually Appeared Date Acquired: 06/25/2021 Weeks Of Treatment: 17 Clustered Wound: Yes Wound Measurements Length: (cm) 15.5 Width: (cm) 15 Depth: (cm) 0.1 Area: (cm) 182.605 Volume: (cm) 18.261 % Reduction in Area: 54.1% % Reduction in Volume: 54% Wound Description Classification: Full Thickness Without Exposed Suppor Exudate Amount: Medium Exudate Type: Serosanguineous Exudate Color: red, brown t Structures Periwound Skin Texture Texture Color No Abnormalities Noted: No No Abnormalities Noted: No Moisture No Abnormalities Noted: No Treatment Notes Wound #20 (Lower Leg) Wound Laterality: [name],  Circumferential Cleanser Soap and Water Discharge Instruction: May shower and wash wound with dial antibacterial soap and water prior to dressing change. Wound Cleanser Discharge Instruction: Cleanse the wound with wound cleanser prior to applying [name] clean dressing using gauze sponges, not tissue or cotton balls. Peri-Wound Care cetaphil lotion Discharge Instruction: patient to provide to nurses to apply to both legs. Topical Primary Dressing KerraCel Ag Gelling Fiber Dressing, 4x5 in (silver alginate) Discharge Instruction: Apply silver alginate to wound bed as instructed Secondary Dressing ABD Pad, 8x10 Discharge Instruction: Apply over primary dressing as directed. Secured With Compression Wrap ThreePress (3 layer compression wrap) Discharge Instruction: Apply three layer compression as directed. ***PLEASE USE KERLIX INSTEAD OF COTTON.*** Compression Stockings [name], [name] ([id number]) [id number] Page 4 of 5 Add-Ons Electronic Signature(s) Signed: 10/29/2021 8:52:44 AM By: [name] Entered By: [name] on 10/28/2021 13:34:47 -------------------------------------------------------------------------------- Wound Assessment Details Patient Name: Date of Service: [name], [name] 10/28/2021 1:15 PM Medical Record Number: [id number] Patient Account Number: [id number] Date of Birth/Sex: Treating RN: [birth year] (88 y.o. F) Primary Care Cailyn Houdek: [name] Other Clinician: Referring Pamelyn Bancroft: Treating Yoseph Haile/Extender: [name] in Treatment: 72 Wound Status Wound Number: 7RR Primary Etiology: Malignant Wound Wound Location: Left, Circumferential Lower Leg Wound Status: Open Wounding Event: Blister Date Acquired: 04/20/2020 Weeks Of Treatment: 72 Clustered Wound: Yes Wound Measurements Length: (cm) 11 Width: (cm) 11 Depth: (cm) 0.1 Area: (cm) 95.033 Volume: (cm) 9.503 % Reduction in  Area: -2994.5% % Reduction in Volume: -2995.4% Wound Description Classification: Full Thickness Without Exposed Suppo Exudate Amount: Medium Exudate Type: Serosanguineous Exudate Color: red, brown rt Structures Periwound Skin Texture Texture Color No Abnormalities Noted: No No Abnormalities Noted: No Moisture No Abnormalities Noted: No Treatment Notes Wound #7RR (Lower Leg) Wound Laterality: Left, Circumferential Cleanser Soap and Water Discharge Instruction: May shower and wash wound with dial antibacterial soap and water prior to dressing change. Wound Cleanser Discharge Instruction: Cleanse the wound with wound cleanser prior to applying [name] clean dressing using gauze sponges, not tissue or cotton balls. Peri-Wound Care cetaphil lotion Discharge Instruction: patient to provide to nurses to apply to both legs. Topical Primary Dressing KerraCel Ag Gelling Fiber Dressing, 4x5 in (silver alginate) Discharge Instruction: Apply silver alginate to wound bed as instructed Secondary Dressing ABD Pad, 8x10 [name], [name] ([id number]) [phone].pdf Page 5 of 5 Discharge Instruction: Apply over primary dressing as directed. Secured With Compression Wrap ThreePress (3 layer compression wrap) Discharge Instruction: Apply three layer compression as directed. ***PLEASE USE KERLIX INSTEAD OF COTTON.*** Compression Stockings Add-Ons Electronic Signature(s) Signed: 10/29/2021 8:52:44 AM By: [name] Entered By: [name] on 10/28/2021 13:34:47 -------------------------------------------------------------------------------- Vitals Details Patient Name: Date of Service: [name], [name] 10/28/2021 1:15 PM Medical Record Number: [id number] Patient Account Number:  [id number] Date of Birth/Sex: Treating RN: 19-Apr-1925 (88 y.o. F) Primary Care [name]: [name] Other Clinician: Referring Oley Lahaie: Treating Young Brim/Extender: [name] in Treatment: 3 Vital Signs Time Taken: 13:34 Temperature (F): 97.7 Height (in): 65 Pulse (bpm): 87 Weight (lbs): 163 Respiratory Rate (breaths/min): 18 Body Mass Index (BMI): 27.1 Blood Pressure (mmHg): 105/68 Reference Range: 80 - 120 mg / dl Electronic Signature(s) Signed: 10/29/2021 8:52:44 AM By: [name] Entered By: [name] on 10/28/2021 13:34:27

## 2021-10-29 NOTE — Progress Notes (Signed)
ANASOFIA, MICALLEF A ([id number]) [id number] Page 1 of 1 Visit Report for 10/28/2021 SuperBill Details Patient Name: Date of Service: [name], [place]. 10/28/2021 Medical Record Number: [id number] Patient Account Number: [id number] Date of Birth/Sex: Treating RN: Feb 16, 1925 (88 y.o. F) Primary Care Provider: [name] Other Clinician: Referring Provider: Treating Provider/Extender: [name] in Treatment: 72 Diagnosis Coding ICD-10 Codes Code Description C44.92 Squamous cell carcinoma of skin, unspecified L97.812 Non-pressure chronic ulcer of other part of right lower leg with fat layer exposed L97.822 Non-pressure chronic ulcer of other part of left lower leg with fat layer exposed I89.0 Lymphedema, not elsewhere classified I87.313 Chronic venous hypertension (idiopathic) with ulcer of bilateral lower extremity Facility Procedures CPT4 Description Modifier Quantity Code [id number] BILATERAL: Application of multi-layer venous compression system; leg (below knee), including ankle and 1 foot. Electronic Signature(s) Signed: 10/28/2021 4:00:22 PM By: Kalman Shan DO Signed: 10/29/2021 8:52:44 AM By: Erenest Blank Entered By: Erenest Blank on 10/28/2021 14:36:46

## 2021-10-30 DIAGNOSIS — I5030 Unspecified diastolic (congestive) heart failure: Secondary | ICD-10-CM | POA: Diagnosis not present

## 2021-10-30 DIAGNOSIS — R2689 Other abnormalities of gait and mobility: Secondary | ICD-10-CM | POA: Diagnosis not present

## 2021-11-01 DIAGNOSIS — R2689 Other abnormalities of gait and mobility: Secondary | ICD-10-CM | POA: Diagnosis not present

## 2021-11-01 DIAGNOSIS — I5030 Unspecified diastolic (congestive) heart failure: Secondary | ICD-10-CM | POA: Diagnosis not present

## 2021-11-04 ENCOUNTER — Encounter (HOSPITAL_BASED_OUTPATIENT_CLINIC_OR_DEPARTMENT_OTHER): Payer: Medicare Other | Admitting: Internal Medicine

## 2021-11-04 DIAGNOSIS — I872 Venous insufficiency (chronic) (peripheral): Secondary | ICD-10-CM | POA: Diagnosis not present

## 2021-11-04 DIAGNOSIS — L97812 Non-pressure chronic ulcer of other part of right lower leg with fat layer exposed: Secondary | ICD-10-CM | POA: Diagnosis not present

## 2021-11-04 DIAGNOSIS — I890 Lymphedema, not elsewhere classified: Secondary | ICD-10-CM | POA: Diagnosis not present

## 2021-11-04 DIAGNOSIS — L97822 Non-pressure chronic ulcer of other part of left lower leg with fat layer exposed: Secondary | ICD-10-CM | POA: Diagnosis not present

## 2021-11-04 DIAGNOSIS — M1990 Unspecified osteoarthritis, unspecified site: Secondary | ICD-10-CM | POA: Diagnosis not present

## 2021-11-04 DIAGNOSIS — I110 Hypertensive heart disease with heart failure: Secondary | ICD-10-CM | POA: Diagnosis not present

## 2021-11-04 NOTE — Progress Notes (Signed)
KALENNA, MILLETT A ([id number]) [id number] Page 1 of 1 Visit Report for 11/04/2021 SuperBill Details Patient Name: Date of Service: [name], [place]. 11/04/2021 Medical Record Number: [id number] Patient Account Number: [id number] Date of Birth/Sex: Treating RN: December 08, 1925 (88 y.o. Donalda Ewings Primary Care Provider: [name] Other Clinician: Referring Provider: Treating Provider/Extender: [name] in Treatment: 28 Diagnosis Coding ICD-10 Codes Code Description C44.92 Squamous cell carcinoma of skin, unspecified L97.812 Non-pressure chronic ulcer of other part of right lower leg with fat layer exposed L97.822 Non-pressure chronic ulcer of other part of left lower leg with fat layer exposed I89.0 Lymphedema, not elsewhere classified I87.313 Chronic venous hypertension (idiopathic) with ulcer of bilateral lower extremity Facility Procedures CPT4 Description Modifier Quantity Code [id number] BILATERAL: Application of multi-layer venous compression system; leg (below knee), including ankle and 1 foot. Electronic Signature(s) Signed: 11/04/2021 1:28:24 PM By: Kalman Shan DO Signed: 11/04/2021 1:44:39 PM By: Sharyn Creamer RN, BSN Entered By: Sharyn Creamer on 11/04/2021 16:19:10

## 2021-11-04 NOTE — Progress Notes (Signed)
[name], BALCH [name] ([id number]) [id number] Page 1 of 5 Visit Report for 11/04/2021 Arrival Information Details Patient Name: Date of Service: [name], [name]. 11/04/2021 1:15 PM Medical Record Number: [id number] Patient Account Number: [id number] Date of Birth/Sex: Treating RN: 1925/04/09 (88 y.o. F) Primary Care Muhammadali Ries: [name] Other Clinician: Referring Seven Maren[place]: Treating Emrik Erhard/Extender: [name] in Treatment: 7 Visit Information History Since Last Visit Added or deleted any medications: No Patient Arrived: [place] Any [name] allergies or adverse reactions: No Arrival Time: 10:10 Had [name] fall or experienced change in No Accompanied By: self activities of daily living that may affect Transfer Assistance: None risk of falls: Patient Identification Verified: Yes Signs or symptoms of abuse/neglect since last visito No Secondary Verification Process Completed: Yes Hospitalized since last visit: No Patient Requires Transmission-Based No Implantable device outside of the clinic excluding No Precautions: cellular tissue based products placed in the center Patient Has Alerts: Yes since last visit: Patient Alerts: R ABI = Non compressible Has Compression in Place as Prescribed: Yes L ABI = Non Pain Present Now: No compressible Electronic Signature(s) Signed: 11/04/2021 1:40:46 PM By: Erenest Blank Entered By: Erenest Blank on 11/04/2021 13:11:33 -------------------------------------------------------------------------------- Compression Therapy Details Patient Name: Date of Service: [name], [name]. 11/04/2021 1:15 PM Medical Record Number: [id number] Patient Account Number: [id number] Date of Birth/Sex: Treating RN: 01-29-1925 (88 y.o. [name] Primary Care Rawn Quiroa: [name] Other Clinician: Referring Jet Armbrust: Treating Sya Nestler/Extender: [name]  in Treatment: 52 Compression Therapy Performed for Wound Assessment: Wound #20 Right,Circumferential Lower Leg Performed By: Clinician Sharyn Creamer, RN Compression Type: Three Hydrologist) Signed: 11/04/2021 1:44:39 PM By: Sharyn Creamer RN, BSN Entered By: Sharyn Creamer on 11/04/2021 16:18:01 -------------------------------------------------------------------------------- Compression Therapy Details Patient Name: Date of Service: [name], [name]. 11/04/2021 1:15 PM Medical Record Number: [id number] Patient Account Number: [id number] Date of Birth/Sex: Treating RN: August 29, 1925 (88 y[address]o. [address] Kimie [name] ([id number]) [id number] Page 2 of 5 Primary Care Yvonda Fouty: [name] Other Clinician: Referring Kaytlin Burklow: Treating Swan [place]/Extender: [name] in Treatment: 37 Compression Therapy Performed for Wound Assessment: Wound #7RR Left,Circumferential Lower Leg Performed By: Clinician Sharyn Creamer, RN Compression Type: Three Hydrologist) Signed: 11/04/2021 1:44:39 PM By: Sharyn Creamer RN, BSN Entered By: Sharyn Creamer on 11/04/2021 16:18:01 -------------------------------------------------------------------------------- Encounter Discharge Information Details Patient Name: Date of Service: [name], [name]. 11/04/2021 1:15 PM Medical Record Number: [id number] Patient Account Number: [id number] Date of Birth/Sex: Treating RN: October 04, 1925 (88 y.o. [name] Primary Care Miliana Gangwer: [name] Other Clinician: Referring Tashan Kreitzer: Treating Tikisha Molinaro/Extender: [name] in Treatment: 92 Encounter Discharge Information Items Discharge Condition: Stable Ambulatory Status: Walker Discharge Destination: Home Transportation: Private Auto Accompanied By: self Schedule Follow-up Appointment: Yes Clinical Summary of Care:  Patient Declined Electronic Signature(s) Signed: 11/04/2021 1:44:39 PM By: Sharyn Creamer RN, BSN Entered By: Sharyn Creamer on 11/04/2021 16:18:48 -------------------------------------------------------------------------------- Patient/Caregiver Education Details Patient Name: Date of Service: [name], [name]. 10/16/2023andnbsp1:15 PM Medical Record Number: [id number] Patient Account Number: [id number] Date of Birth/Gender: Treating RN: 1925/02/16 (88 y.o. [name] Primary Care Physician: [name] Other Clinician: Referring Physician: Treating Physician/Extender: [name] in Treatment: 46 Education Assessment Education Provided To: Patient Education Topics Provided Wound/Skin Impairment: Methods: Explain/Verbal Responses: State content correctly [employer]) Signed: 11/04/2021 1:44:39 PM By: Sharyn Creamer RN, BSN [name], [name] ([id number]) [id number] Page 3  of 5 Signed: 11/04/2021 1:44:39 PM By: Sharyn Creamer RN, BSN Entered By: Sharyn Creamer on 11/04/2021 16:18:32 -------------------------------------------------------------------------------- Wound Assessment Details Patient Name: Date of Service: [name], [name]. 11/04/2021 1:15 PM Medical Record Number: [id number] Patient Account Number: [id number] Date of Birth/Sex: Treating RN: 1925-03-05 (88 y.o. F) Primary Care Michaele Amundson: [name] Other Clinician: Referring Storm Sovine: Treating Jarom Govan/Extender: [name] in Treatment: 31 Wound Status Wound Number: 20 Primary Etiology: Lymphedema Wound Location: Right, Circumferential Lower Leg Wound Status: Open Wounding Event: Gradually Appeared Date Acquired: 06/25/2021 Weeks Of Treatment: 18 Clustered Wound: Yes Wound Measurements Length: (cm) 15.5 Width: (cm) 15 Depth: (cm) 0.1 Area: (cm) 182.605 Volume: (cm) 18.261 % Reduction  in Area: 54.1% % Reduction in Volume: 54% Wound Description Classification: Full Thickness Without Exposed Supp Exudate Amount: Medium Exudate Type: Serosanguineous Exudate Color: red, brown ort Structures Periwound Skin Texture Texture Color No Abnormalities Noted: No No Abnormalities Noted: No Moisture No Abnormalities Noted: No Treatment Notes Wound #20 (Lower Leg) Wound Laterality: Right, Circumferential Cleanser Soap and Water Discharge Instruction: May shower and wash wound with dial antibacterial soap and water prior to dressing change. Wound Cleanser Discharge Instruction: Cleanse the wound with wound cleanser prior to applying [name] clean dressing using gauze sponges, not tissue or cotton balls. Peri-Wound Care cetaphil lotion Discharge Instruction: patient to provide to nurses to apply to both legs. Topical Primary Dressing KerraCel Ag Gelling Fiber Dressing, 4x5 in (silver alginate) Discharge Instruction: Apply silver alginate to wound bed as instructed Secondary Dressing ABD Pad, 8x10 Discharge Instruction: Apply over primary dressing as directed. Secured With VELORA, HORSTMAN ([id number]) [id number] Page 4 of 5 Compression Wrap ThreePress (3 layer compression wrap) Discharge Instruction: Apply three layer compression as directed. ***PLEASE USE KERLIX INSTEAD OF COTTON.*** Compression Stockings Add-Ons Electronic Signature(s) Signed: 11/04/2021 1:40:46 PM By: Erenest Blank Entered By: Erenest Blank on 11/04/2021 13:12:44 -------------------------------------------------------------------------------- Wound Assessment Details Patient Name: Date of Service: [name], [place] 11/04/2021 1:15 PM Medical Record Number: [id number] Patient Account Number: [id number] Date of Birth/Sex: Treating RN: January 04, 1926 (88 y.o. F) Primary Care Lorelie Biermann: [name] Other Clinician: Referring Naim Murtha: Treating Estiven Kohan/Extender: [name] in Treatment: 64 Wound Status Wound Number: 7RR Primary Etiology: Malignant Wound Wound Location: Left, Circumferential Lower Leg Wound Status: Open Wounding Event: Blister Date Acquired: 04/20/2020 Weeks Of Treatment: 73 Clustered Wound: Yes Wound Measurements Length: (cm) 11 Width: (cm) 11 Depth: (cm) 0.1 Area: (cm) 95.033 Volume: (cm) 9.503 % Reduction in Area: -2994.5% % Reduction in Volume: -2995.4% Wound Description Classification: Full Thickness Without Exposed Suppo Exudate Amount: Medium Exudate Type: Serosanguineous Exudate Color: red, brown rt Structures Periwound Skin Texture Texture Color No Abnormalities Noted: No No Abnormalities Noted: No Moisture No Abnormalities Noted: No Treatment Notes Wound #7RR (Lower Leg) Wound Laterality: Left, Circumferential Cleanser Soap and Water Discharge Instruction: May shower and wash wound with dial antibacterial soap and water prior to dressing change. Wound Cleanser Discharge Instruction: Cleanse the wound with wound cleanser prior to applying [name] clean dressing using gauze sponges, not tissue or cotton balls. Peri-Wound Care cetaphil lotion Discharge Instruction: patient to provide to nurses to apply to both legs. Topical [name], [name] ([id number]) [id number] Page 5 of 5 Primary Dressing KerraCel Ag Gelling Fiber Dressing, 4x5 in (silver alginate) Discharge Instruction: Apply silver alginate to wound bed as instructed Secondary Dressing ABD Pad, 8x10 Discharge Instruction: Apply over primary dressing as directed. Secured With Compression Wrap ThreePress (3 layer compression  wrap) Discharge Instruction: Apply three layer compression as directed. ***PLEASE USE KERLIX INSTEAD OF COTTON.*** Compression Stockings Add-Ons Electronic Signature(s) Signed: 11/04/2021 1:40:46 PM By: Erenest Blank Entered By: Erenest Blank on 11/04/2021  13:12:44 -------------------------------------------------------------------------------- Vitals Details Patient Name: Date of Service: [name], [place] [name]. 11/04/2021 1:15 PM Medical Record Number: [id number] Patient Account Number: [id number] Date of Birth/Sex: Treating RN: September 22, 1925 (88 y.o. F) Primary Care Kevyn Wengert: [name] Other Clinician: Referring Ruchy Wildrick: Treating Love Chowning/Extender: [name] in Treatment: 44 Vital Signs Time Taken: 13:11 Temperature (F): 97.8 Height (in): 65 Pulse (bpm): 88 Weight (lbs): 163 Respiratory Rate (breaths/min): 18 Body Mass Index (BMI): 27.1 Blood Pressure (mmHg): 131/59 Reference Range: 80 - 120 mg / dl Electronic Signature(s) Signed: 11/04/2021 1:40:46 PM By: Erenest Blank Entered By: Erenest Blank on 11/04/2021 13:11:50

## 2021-11-05 DIAGNOSIS — D485 Neoplasm of uncertain behavior of skin: Secondary | ICD-10-CM | POA: Diagnosis not present

## 2021-11-05 DIAGNOSIS — L281 Prurigo nodularis: Secondary | ICD-10-CM | POA: Diagnosis not present

## 2021-11-05 DIAGNOSIS — Z85828 Personal history of other malignant neoplasm of skin: Secondary | ICD-10-CM | POA: Diagnosis not present

## 2021-11-05 DIAGNOSIS — C44622 Squamous cell carcinoma of skin of right upper limb, including shoulder: Secondary | ICD-10-CM | POA: Diagnosis not present

## 2021-11-06 DIAGNOSIS — R2689 Other abnormalities of gait and mobility: Secondary | ICD-10-CM | POA: Diagnosis not present

## 2021-11-06 DIAGNOSIS — I5030 Unspecified diastolic (congestive) heart failure: Secondary | ICD-10-CM | POA: Diagnosis not present

## 2021-11-11 ENCOUNTER — Other Ambulatory Visit: Payer: Self-pay | Admitting: Cardiovascular Disease

## 2021-11-11 ENCOUNTER — Other Ambulatory Visit: Payer: Self-pay | Admitting: Internal Medicine

## 2021-11-11 ENCOUNTER — Encounter (HOSPITAL_BASED_OUTPATIENT_CLINIC_OR_DEPARTMENT_OTHER): Payer: Medicare Other | Admitting: Internal Medicine

## 2021-11-11 DIAGNOSIS — I872 Venous insufficiency (chronic) (peripheral): Secondary | ICD-10-CM | POA: Diagnosis not present

## 2021-11-11 DIAGNOSIS — L97822 Non-pressure chronic ulcer of other part of left lower leg with fat layer exposed: Secondary | ICD-10-CM

## 2021-11-11 DIAGNOSIS — C4492 Squamous cell carcinoma of skin, unspecified: Secondary | ICD-10-CM | POA: Diagnosis not present

## 2021-11-11 DIAGNOSIS — I890 Lymphedema, not elsewhere classified: Secondary | ICD-10-CM | POA: Diagnosis not present

## 2021-11-11 DIAGNOSIS — L97812 Non-pressure chronic ulcer of other part of right lower leg with fat layer exposed: Secondary | ICD-10-CM

## 2021-11-11 DIAGNOSIS — I87313 Chronic venous hypertension (idiopathic) with ulcer of bilateral lower extremity: Secondary | ICD-10-CM | POA: Diagnosis not present

## 2021-11-11 DIAGNOSIS — M1990 Unspecified osteoarthritis, unspecified site: Secondary | ICD-10-CM | POA: Diagnosis not present

## 2021-11-11 DIAGNOSIS — I110 Hypertensive heart disease with heart failure: Secondary | ICD-10-CM | POA: Diagnosis not present

## 2021-11-11 NOTE — Telephone Encounter (Signed)
Prescription refill request for Eliquis received. Indication:Afib Last office visit:7/23 Scr:1.2 Age: 88 Weight:68.4 kg  Prescription refilled

## 2021-11-11 NOTE — Progress Notes (Signed)
[name], [name] ([id number]) [id number] Page 1 of 12 Visit Report for 11/11/2021 Chief Complaint Document Details Patient Name: Date of Service: [name], [name] 11/11/2021 1:30 PM Medical Record Number: [id number] Patient Account Number: [id number] Date of Birth/Sex: Treating RN: April 04, 1925 (88 y.o. [name] Primary Care Provider: [name] Other Clinician: Referring Provider: Treating Provider/Extender: [name] in Treatment: 28 Information Obtained from: Patient Chief Complaint Bilateral lower extremity wounds that have been biopsied and positive for squamous cell carcinoma Bilateral lower extremity wounds due to chronic venous insufficiency/lymphedema Electronic Signature(s) Signed: 11/11/2021 3:32:17 PM By: Kalman Shan DO Entered By: Kalman Shan on 11/11/2021 14:34:13 -------------------------------------------------------------------------------- HPI Details Patient Name: Date of Service: [name], [name] 11/11/2021 1:30 PM Medical Record Number: [id number] Patient Account Number: [id number] Date of Birth/Sex: Treating RN: 31-May-1925 (88 y.o. [name] Primary Care Provider: [name] Other Clinician: Referring Provider: Treating Provider/Extender: [name] in Treatment: 84 History of Present Illness Location: left leg HPI Description: Admission 5/23 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 She states that the wound on the right has been present for a year. The 1 on the left has opened up 1 month ago. She is followed with oncology for this issue as she had biopsies that showed squamous cell  carcinoma. She is also seeing radiation oncology for treatment options. She presents today because she would like for her wounds to be healed by [place]. She currently denies signs of infection. 6/1; patient presents for 1 week follow-up. She states she has tolerated the leg wraps well. She states these do not bother her and is happy to continue with them. She is scheduled to see her oncologist today to go over treatment options for the bilateral lower extremity squamous cell carcinoma. Radiation is currently not a recommended option. Patient states she overall feels well. 6/22; patient presents for 3-week follow-up. She has tolerated the wraps well until her last wrap where she states they were uncomfortable. She attributes this to the home health nurse. She denies signs of infection. She has started her first treatment of antibody infusions for her Bilateral lower extremity squamous cell carcinoma. She has no complaints today. 7/21; patient presents for 1 month follow-up. Unfortunately she has not had good experience with her wrap changes with home health. She would like to do her own dressing changes. She continues to do her antibody infusions. She denies signs of infection. 7/28; patient presents for 1 week follow-up. At last clinic visit she was switched to daily dressing changes due to issues with the wrap and home health placing them. Unfortunately she has developed weeping to her legs bilaterally. She would like to be placed in wraps today. She would also like to follow with [place] weekly for wrap changes instead of having home health change them. She denies signs of infection. 8/4; patient presents for 1 week follow-up. She has tolerated the Kerlix/Coban wraps well. She no longer has weeping to her legs. She took the wrap off 1 day before coming in to be able to take a shower. She has no issues or complaints today. She denies signs of infection. 8/18; patient presents for follow-up. Patient has  tolerated the wraps well. She brought her Velcro compression wraps  today. She has no issues or complaints today. She had her chemotherapy infusion yesterday without issues. She denies signs of infection. 8/25; patient presents for follow-up. She used her juxta lite compressions for the past week. It is unclear if she is able to put these on correctly since she ALTOVISE, WAHLER ([id number]) [id number] Page 2 of 12 states she has a hard time getting them to look right. She reports 2 open wounds. She currently denies signs of infection. 9/1; patient presents for 1 week follow-up. She has 1 open wound. She tolerated the compression wraps well. She currently denies signs of infection. 9/8; patient presents for 1 week follow-up. She has 2 open wounds 1 on each leg. She has tolerated the compression wrap well. She currently denies signs of infection. 9/15; patient presents for 1 week follow-up. She now has 3 wounds. 2 on the left and 1 on the right. She continues to tolerate the compression wrap well. She currently denies signs of infection. She has obtained furosemide by her primary care physician and would like to discuss when to take this. 9/22; patient presents for 1 week follow-up. She has scattered wounds on her lower extremities bilaterally. She did take furosemide twice in the past week. She does not recall having to urinate more frequently. She tolerated the 3 layer compression well. She denies signs of infection. 10/13; patient has not been here recently because of [place]. Apparently the facility was only putting gauze on her legs. This is a patient I do not normally see. 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 Ronnald Ramp at [facility] dermatology. She is quite convincing that she did not have radiation to her lower legs. It is likely she also has significant chronic venous insufficiency stasis  dermatitis. We have been using silver alginate under kerlix Coban. She has obvious open areas medially on the right and areas on the left. She also has areas of extensive dry flaking adherent areas on the right and to a lesser extent on the left anterior. Nodular areas on the left lateral lower leg left posterior calf and right mid calf medially. 10/21; patient presents for follow-up. She has no issues or complaints today. She has tolerated the 3 layer compression well bilaterally. She denies signs of infection. 10/27; patient presents for follow-up. She continues to tolerate the 3 layer compression wrap well. She denies signs of infection. 11/30; patient presents for follow-up. She has no issues or complaints today. 11/10; patient arrived in clinic today for nurse visit accompanied by her daughter from [place]. The daughter had multiple questions so we turned this into doctors visit. Apparently after the last time I saw this woman in October she went to see Dr. Ronnald Ramp but he did not take the wraps off. She previously was treated with Cemiplimab for her squamous cell carcinoma. She did not receive radiation to her legs. I had wanted Dr. Ronnald Ramp to look at this because of the exceptionally damaged skin on her lower legs bilaterally. She has odd looking wounds on the left medial lower leg also extending posteriorly which we have not made a lot of progress on. But she also has a raised hyperkeratotic nodules on the right anterior tibial area plaques on the left medial lower thigh. These are not areas that are under compression. We have been using silver alginate on any open areas Liberal TCA under 3 layer compression. 11/17; patient presents for follow-up. She  had her wraps taken off yesterday for her dermatology appointment. She reports excessive weeping to her legs bilaterally after the wraps were taken off. She currently denies signs of infection. 12/12/2020 upon evaluation today patient appears to be  doing about the same in regard to her wounds. She was actually started on Cipro after having biopsies apparently at her dermatology clinic she does not have the results back from the actual biopsy but the culture did return and apparently they placed her on the Cipro she started that just this morning. Other than that her legs appear to be doing okay at this time. 12/1; patient presents for follow-up. She has no issues or complaints today. She has started Lasix 40 mg daily to help with her bilateral leg swelling. 12/8; patient presents for follow-up. She has no issues or complaints today. 12/15; patient presents for 1 week follow-up. She has no issues or complaints today. 12/22; patient presents for follow-up. She has no issues or complaints today. 1/5; patient presents for follow-up. She has no issues or complaints today. She has tolerated the compression wraps well. 1/12; patient presents for follow-up. She is tolerated the compression wrap well and has no issues or complaints today. She denies signs of infection. 1/19; patient presents for follow-up. She took the compression wraps off 1 to 2 days ago to take a shower and did not have compression wraps replaced. She reports increased blistering throughout her legs bilaterally. She currently denies signs of infection. 1/26; patient presents for follow-up. She has no issues or complaints today. She tolerated the compression wraps well. She has not heard from oncology to schedule an appointment. 2/2; patient presents for follow-up. She has no issues or complaints today. She continues to tolerate the compression wrap well. 2/16; patient presents for follow-up. She has no issues or complaints today. 2/23; patient presents for follow-up. She has no issues or complaints today. Tomorrow she has an appointment with oncology to look at the suspicious lesion on her right lower extremity. She currently denies signs of infection. 3/2; patient presents for  follow-up. She has been using her juxta light compression to the right lower extremity however there has been increased swelling and opening of wounds throughout the legs with weeping. She currently denies signs of infection. She had no issues with the compression wrap to the left lower extremity. 3/16; patient presents for follow-up. She has no issues or complaints today. She states that she has been restarted on infusions for her squamous cell carcinoma of her legs. She has also been increased on Lasix to 40 mg twice daily. She has noticed a decrease in swelling to her legs. 3/23; patient presents for follow-up. She starts her second infusion next week for her squamous cell carcinoma lesions to her legs. She reports no issues with the compression wraps. 3/30; patient presents for follow-up. She has no issues or complaints today. 4/6; patient presents for follow-up. Her left lower extremity wounds have healed. She has her juxta light compression with her today. She has no issues or complaints today. 4/13; this is a patient we actually discharged to juxta lite stockings on the left leg the last time she was here. She came in on Monday for [place] to look at the leg and a nurse visit. She had massive swelling and numerous blisters and wound reopening. We put her back in 3 layer compression although I did not generate a note. Silver alginate on the wounded areas. We have also been doing the same on the  right. In the meantime she is obtained her external compression pumps that were previously ordered and she started to use them. She has skin cancers that are obvious on the right leg x2 her appointment with Dr. Jarome Matin of dermatology is on May 5.. She is also receiving weekly chemotherapy for recurrent presumably squamous cell carcinomas apparently has had 2 treatments 4/20; patient presents for follow-up. She has her new juxta lite compressions today. She continues to have open wounds to the left lower  extremity. She has follow-up with dermatology in 2 weeks. She continues to have IV infusions for her squamous cell carcinoma lesions on her legs. AYRA, HODGDON A ([id number]) [id number] Page 3 of 12 4/28; the patient has bilateral lower extremity wounds predominantly on the right lateral upper leg, right anterior lower leg which in itself is probably a recurrent cancer as well as the left lateral lower leg. She has recurrent squamous cell carcinoma currently being treated with an IV infusion. She also has scattered nodules on her upper lower legs and thighs I am not certain of all of this is felt to be malignant as well We have been using silver alginate on the open areas wrapping her legs. She also has her external compression pumps at home but I am not clear that she is going to use these 5/2; patient presents for follow-up. She has not been taking her diuretics as prescribed. She sees Dr. Dian Situ, dermatology at the end of the week. She tolerated the compression wraps well today. She has her juxta lite compressions. 5/8; patient presents for follow-up. She saw Dr. Dian Situ, dermatology and she has nothing to report on. She has been using her juxta lite compression to the right lower extremity. She has tolerated the compression wrap well in the left lower extremity. She has not been taking her diuretic. She is scheduled to continue her infusions for her squamous cell carcinoma on her legs. 5/18; patient presents for follow-up. We wrapped her in 3 layer compression at last clinic visit. She has no issues or complaints today. 5/22; patient presents for follow-up. She has been wrapped in 3 layer compression bilaterally to her lower extremities over the past week. She has been scratching at the top of the wrap and picking at her skin. This area has increased warmth and erythema. 5/30; patient presents for follow-up. She completed Keflex with resolution of her symptoms to the right  lower extremity. She no longer has increased warmth or erythema. She continues her infusions for her squamous cell carcinoma lesions. We continue to wrap her in 3 layer compression with silver alginate. She currently denies signs of infection. 6/8; the patient went to see Dr. Ronnald Ramp of dermatology 2 days ago unfortunately we do not have his notes. They removed her compression wraps of course but did not adequately replace them. She comes in today with 3 wounds on the right lateral upper calf 1 medially below the obvious squamous cell carcinoma there is a large necrotic wound on the left lateral tibial area on the left and several blisters. The patient remains on Cemiplimad infusions for the squamous cell carcinoma bilaterally. She is not felt to be a good candidate for radiation. Some of the skin changes superiorly in the upper legs bilaterally according to Dr. Ronnald Ramp related to these infusions the same like fleshy nodules to me not blisters 6/13; patient presents for follow-up. We have been using silver alginate under 3 layer compression bilaterally. She has no issues or complaints today. 6/19; patient  presents for follow-up. She will see her oncologist in 3 days and next week her dermatologist. She has no new complaints today. We are using silver alginate under 3 layer compression bilaterally. 6/29; patient presents for follow-up. She she saw Dr. Darrin Nipper last week and States she received injections to the lesions on her legs. We will ask for office visit records. She could not be wrapped with compression and has more drainage on exam today. 7/6; patient presents for follow-up. She continues to receive injections to the lesions in her legs by her dermatologist. She had this done earlier today. She has no issues or complaints today. 7/13; patient presents for follow-up. She is following with Dr.Kavuri once weekly for treatment of her SCCs/KAs with IL5-FU. We have been wrapping her with silver alginate  under Kerlix/Coban. She has no issues or complaints today. 7/20; patient presents for follow-up. She has elected to stop doing weekly treatments for her squamous cell carcinoma lesions. She states she experiences too much pain from the injections. She is continuing her immunotherapy infusions. Next 1 is scheduled in August. T the legs we have been using silver alginate o under Kerlix/Coban. Her wounds actually look better today. 7/27; patient presents for follow-up. We have been using Medihoney and silver alginate under compression therapy. She has no issues or complaints today. 8/1; patient presents for follow-up. We have been using Medihoney and silver alginate under compression therapy. She reports no issues. She is scheduled for her infusion for her SCC later this week. 8/22; patient presents for follow-up. We have been using Medihoney and silver alginate under compression therapy. She reports no issues today. She has moved back home from the skilled nursing facility. She has home health that will be coming out to do OT and PT She would like for them to also do wound . care. 9/18; patient presents for follow-up. We have been using Medihoney and silver alginate under compression therapy. Home health is no longer coming out for wound care. She comes in weekly for wrap changes in our office During nurse visits. 10/2; patient presents for follow-up. We have been using silver alginate under compression therapy. She has no issues or complaints today. She states she is doing her last chemoinfusion next week. 10/23; Patient presents for follow-up. We continue to use silver alginate under compression therapy. She has a few more wounds present today. She states she is taking her lasix as prescribed. She has completed her infusions for her SCC lesions. Electronic Signature(s) Signed: 11/11/2021 3:32:17 PM By: Kalman Shan DO Entered By: Kalman Shan on 11/11/2021  14:36:50 -------------------------------------------------------------------------------- Physical Exam Details Patient Name: Date of Service: [name], [place]. 11/11/2021 1:30 PM Medical Record Number: [id number] Patient Account Number: [id number] Date of Birth/Sex: Treating RN: Mar 11, 1925 (88 y.o. [name] Primary Care Provider: [name] Other Clinician: Referring Provider: Treating Provider/Extender: [name] in Treatment: [address] [place] ([id number]) [id number] Page 4 of 12 Constitutional respirations regular, non-labored and within target range for patient.[name] Psychiatric pleasant and cooperative. Notes Multiple scattered open wounds to her lower extremities bilaterally limited to skin breakdown. Areas suspicious for malignancy. No surrounding signs of infection. Adequate edema control. Electronic Signature(s) Signed: 11/11/2021 3:32:17 PM By: Kalman Shan DO Entered By: Kalman Shan on 11/11/2021 14:39:06 -------------------------------------------------------------------------------- Physician Orders Details Patient Name: Date of Service: [name], [place]. 11/11/2021 1:30 PM Medical Record Number: [id number] Patient Account Number: [id number] Date of Birth/Sex: Treating RN: 1925/07/11 (88 y.o. [name], [name]  Primary Care Provider: [name] Other Clinician: Referring Provider: Treating Provider/Extender: [name] in Treatment: 25 Verbal / Phone Orders: No Diagnosis Coding Follow-up Appointments Return appointment in 3 weeks. - Dr. Heber Appleton Nurse Visit: - Weekly and every 3 weeks see Dr. Heber Bernard. Bathing/ Shower/ Hygiene May shower with protection but do not get wound dressing(s) wet. May shower and wash wound with soap and water. - with dressing changes may wash wounds with soap and water. Edema Control - Lymphedema / SCD /  Other Lymphedema Pumps. Use Lymphedema pumps on leg(s) 2-3 times a day for 45-60 minutes. If wearing any wraps or hose, do not remove them. Continue exercising as instructed. - use one hour each time twice a day. Elevate legs to the level of the heart or above for 30 minutes daily and/or when sitting, a frequency of: - elevate the legs throughout the day heart level if possible. Avoid standing for long periods of time. Exercise regularly Additional Orders / Instructions Follow Nutritious Diet Wound Treatment Wound #20 - Lower Leg Wound Laterality: Right, Circumferential Cleanser: Soap and Water 1 x Per Week/30 Days Discharge Instructions: May shower and wash wound with dial antibacterial soap and water prior to dressing change. Cleanser: Wound Cleanser 1 x Per Week/30 Days Discharge Instructions: Cleanse the wound with wound cleanser prior to applying a clean dressing using gauze sponges, not tissue or cotton balls. Peri-Wound Care: cetaphil lotion 1 x Per Week/30 Days Discharge Instructions: patient to provide to nurses to apply to both legs. Prim Dressing: KerraCel Ag Gelling Fiber Dressing, 4x5 in (silver alginate) 1 x Per Week/30 Days ary Discharge Instructions: Apply silver alginate to wound bed as instructed Secondary Dressing: ABD Pad, 8x10 1 x Per Week/30 Days Discharge Instructions: Apply over primary dressing as directed. Compression Wrap: ThreePress (3 layer compression wrap) 1 x Per Week/30 Days Discharge Instructions: Apply three layer compression as directed. ***PLEASE USE KERLIX INSTEAD OF COTTON.*** Wound #7RR - Lower Leg Wound Laterality: Left, Circumferential Cleanser: Soap and Water 1 x Per Week/30 Days Discharge Instructions: May shower and wash wound with dial antibacterial soap and water prior to dressing change. RAKAYLA, RICKLEFS A ([id number]) [id number] Page 5 of 12 Cleanser: Wound Cleanser 1 x Per Week/30 Days Discharge Instructions:  Cleanse the wound with wound cleanser prior to applying a clean dressing using gauze sponges, not tissue or cotton balls. Peri-Wound Care: cetaphil lotion 1 x Per Week/30 Days Discharge Instructions: patient to provide to nurses to apply to both legs. Prim Dressing: KerraCel Ag Gelling Fiber Dressing, 4x5 in (silver alginate) 1 x Per Week/30 Days ary Discharge Instructions: Apply silver alginate to wound bed as instructed Secondary Dressing: ABD Pad, 8x10 1 x Per Week/30 Days Discharge Instructions: Apply over primary dressing as directed. Compression Wrap: ThreePress (3 layer compression wrap) 1 x Per Week/30 Days Discharge Instructions: Apply three layer compression as directed. ***PLEASE USE KERLIX INSTEAD OF COTTON.*** Electronic Signature(s) Signed: 11/11/2021 3:32:17 PM By: Kalman Shan DO Entered By: Kalman Shan on 11/11/2021 14:39:16 -------------------------------------------------------------------------------- Problem List Details Patient Name: Date of Service: [name], [place]. 11/11/2021 1:30 PM Medical Record Number: [id number] Patient Account Number: [id number] Date of Birth/Sex: Treating RN: 1925/06/28 (88 y.o. [name] Primary Care Provider: [name] Other Clinician: Referring Provider: Treating Provider/Extender: [name] in Treatment: 34 Active Problems ICD-10 Encounter Code Description Active Date MDM Diagnosis C44.92 Squamous cell carcinoma of skin, unspecified 06/11/2020 No Yes L97.812 Non-pressure chronic ulcer of  other part of right lower leg with fat layer 06/11/2020 No Yes exposed L97.822 Non-pressure chronic ulcer of other part of left lower leg with fat layer exposed5/23/2022 No Yes I89.0 Lymphedema, not elsewhere classified 01/31/2021 No Yes I87.313 Chronic venous hypertension (idiopathic) with ulcer of bilateral lower extremity 03/21/2021 No Yes Inactive Problems Resolved Problems Electronic  Signature(s) Signed: 11/11/2021 3:32:17 PM By: Kalman Shan DO Entered By: Kalman Shan on 11/11/2021 14:33:52 Rucinski, [place] A ([id number]) [id number] Page 6 of 12 -------------------------------------------------------------------------------- Progress Note Details Patient Name: Date of Service: [name], [place]. 11/11/2021 1:30 PM Medical Record Number: [id number] Patient Account Number: [id number] Date of Birth/Sex: Treating RN: 1925/07/27 (88 y.o. [name], [name] Primary Care Provider: [name] Other Clinician: Referring Provider: Treating Provider/Extender: [name] in Treatment: 62 Subjective Chief Complaint Information obtained from Patient Bilateral lower extremity wounds that have been biopsied and positive for squamous cell carcinoma Bilateral lower extremity wounds due to chronic venous insufficiency/lymphedema History of Present Illness (HPI) The following HPI elements were documented for the patient's wound: Location: left leg Admission 5/23 Ms. 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 She states that the wound on the right has been present for a year. The 1 on the left has opened up 1 month ago. She is followed with oncology for this issue as she had biopsies that showed squamous cell carcinoma. She is also seeing radiation oncology for treatment options. She presents today because she would like for her wounds to be healed by [place]. She currently denies signs of infection. 6/1; patient presents for 1 week follow-up. She states she has tolerated the leg wraps well. She states these do not bother her and is happy to continue with them. She is scheduled to see her  oncologist today to go over treatment options for the bilateral lower extremity squamous cell carcinoma. Radiation is currently not a recommended option. Patient states she overall feels well. 6/22; patient presents for 3-week follow-up. She has tolerated the wraps well until her last wrap where she states they were uncomfortable. She attributes this to the home health nurse. She denies signs of infection. She has started her first treatment of antibody infusions for her Bilateral lower extremity squamous cell carcinoma. She has no complaints today. 7/21; patient presents for 1 month follow-up. Unfortunately she has not had good experience with her wrap changes with home health. She would like to do her own dressing changes. She continues to do her antibody infusions. She denies signs of infection. 7/28; patient presents for 1 week follow-up. At last clinic visit she was switched to daily dressing changes due to issues with the wrap and home health placing them. Unfortunately she has developed weeping to her legs bilaterally. She would like to be placed in wraps today. She would also like to follow with [place] weekly for wrap changes instead of having home health change them. She denies signs of infection. 8/4; patient presents for 1 week follow-up. She has tolerated the Kerlix/Coban wraps well. She no longer has weeping to her legs. She took the wrap off 1 day before coming in to be able to take a shower. She has no issues or complaints today. She denies signs of infection. 8/18; patient presents for follow-up. Patient has tolerated the wraps  well. She brought her Velcro compression wraps today. She has no issues or complaints today. She had her chemotherapy infusion yesterday without issues. She denies signs of infection. 8/25; patient presents for follow-up. She used her juxta lite compressions for the past week. It is unclear if she is able to put these on correctly since she states she has a hard  time getting them to look right. She reports 2 open wounds. She currently denies signs of infection. 9/1; patient presents for 1 week follow-up. She has 1 open wound. She tolerated the compression wraps well. She currently denies signs of infection. 9/8; patient presents for 1 week follow-up. She has 2 open wounds 1 on each leg. She has tolerated the compression wrap well. She currently denies signs of infection. 9/15; patient presents for 1 week follow-up. She now has 3 wounds. 2 on the left and 1 on the right. She continues to tolerate the compression wrap well. She currently denies signs of infection. She has obtained furosemide by her primary care physician and would like to discuss when to take this. 9/22; patient presents for 1 week follow-up. She has scattered wounds on her lower extremities bilaterally. She did take furosemide twice in the past week. She does not recall having to urinate more frequently. She tolerated the 3 layer compression well. She denies signs of infection. 10/13; patient has not been here recently because of [place]. Apparently the facility was only putting gauze on her legs. This is a patient I do not normally see. 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 Ronnald Ramp at [facility] dermatology. She is quite convincing that she did not have radiation to her lower legs. It is likely she also has significant chronic venous insufficiency stasis dermatitis. We have been using silver alginate under kerlix Coban. She has obvious open areas medially on the right and areas on the left. She also has areas of extensive dry flaking adherent areas on the right and to a lesser extent on the left anterior. Nodular areas on the left lateral lower leg left posterior calf and right mid calf medially. 10/21; patient presents for follow-up. She has no issues or complaints today. She has tolerated the 3 layer compression  well bilaterally. She denies signs of infection. 10/27; patient presents for follow-up. She continues to tolerate the 3 layer compression wrap well. She denies signs of infection. 11/30; patient presents for follow-up. She has no issues or complaints today. 11/10; patient arrived in clinic today for nurse visit accompanied by her daughter from [place]. The daughter had multiple questions so we turned this into doctors visit. Apparently after the last time I saw this woman in October she went to see Dr. Ronnald Ramp but he did not take the wraps off. She previously was treated with Cemiplimab for her squamous cell carcinoma. She did not receive radiation to her legs. I had wanted Dr. Ronnald Ramp to look at this because of the ARCHIE, ATILANO ([id number]) [id number] Page 7 of 12 exceptionally damaged skin on her lower legs bilaterally. She has odd looking wounds on the left medial lower leg also extending posteriorly which we have not made a lot of progress on. But she also has a raised hyperkeratotic nodules on the right anterior tibial area plaques on the left medial lower thigh. These are not areas that are under compression. We have been using silver alginate on any open areas Liberal TCA under 3  layer compression. 11/17; patient presents for follow-up. She had her wraps taken off yesterday for her dermatology appointment. She reports excessive weeping to her legs bilaterally after the wraps were taken off. She currently denies signs of infection. 12/12/2020 upon evaluation today patient appears to be doing about the same in regard to her wounds. She was actually started on Cipro after having biopsies apparently at her dermatology clinic she does not have the results back from the actual biopsy but the culture did return and apparently they placed her on the Cipro she started that just this morning. Other than that her legs appear to be doing okay at this time. 12/1; patient presents  for follow-up. She has no issues or complaints today. She has started Lasix 40 mg daily to help with her bilateral leg swelling. 12/8; patient presents for follow-up. She has no issues or complaints today. 12/15; patient presents for 1 week follow-up. She has no issues or complaints today. 12/22; patient presents for follow-up. She has no issues or complaints today. 1/5; patient presents for follow-up. She has no issues or complaints today. She has tolerated the compression wraps well. 1/12; patient presents for follow-up. She is tolerated the compression wrap well and has no issues or complaints today. She denies signs of infection. 1/19; patient presents for follow-up. She took the compression wraps off 1 to 2 days ago to take a shower and did not have compression wraps replaced. She reports increased blistering throughout her legs bilaterally. She currently denies signs of infection. 1/26; patient presents for follow-up. She has no issues or complaints today. She tolerated the compression wraps well. She has not heard from oncology to schedule an appointment. 2/2; patient presents for follow-up. She has no issues or complaints today. She continues to tolerate the compression wrap well. 2/16; patient presents for follow-up. She has no issues or complaints today. 2/23; patient presents for follow-up. She has no issues or complaints today. Tomorrow she has an appointment with oncology to look at the suspicious lesion on her right lower extremity. She currently denies signs of infection. 3/2; patient presents for follow-up. She has been using her juxta light compression to the right lower extremity however there has been increased swelling and opening of wounds throughout the legs with weeping. She currently denies signs of infection. She had no issues with the compression wrap to the left lower extremity. 3/16; patient presents for follow-up. She has no issues or complaints today. She states that  she has been restarted on infusions for her squamous cell carcinoma of her legs. She has also been increased on Lasix to 40 mg twice daily. She has noticed a decrease in swelling to her legs. 3/23; patient presents for follow-up. She starts her second infusion next week for her squamous cell carcinoma lesions to her legs. She reports no issues with the compression wraps. 3/30; patient presents for follow-up. She has no issues or complaints today. 4/6; patient presents for follow-up. Her left lower extremity wounds have healed. She has her juxta light compression with her today. She has no issues or complaints today. 4/13; this is a patient we actually discharged to juxta lite stockings on the left leg the last time she was here. She came in on Monday for [place] to look at the leg and a nurse visit. She had massive swelling and numerous blisters and wound reopening. We put her back in 3 layer compression although I did not generate a note. Silver alginate on the wounded areas. We  have also been doing the same on the right. In the meantime she is obtained her external compression pumps that were previously ordered and she started to use them. She has skin cancers that are obvious on the right leg x2 her appointment with Dr. Jarome Matin of dermatology is on May 5.. She is also receiving weekly chemotherapy for recurrent presumably squamous cell carcinomas apparently has had 2 treatments 4/20; patient presents for follow-up. She has her new juxta lite compressions today. She continues to have open wounds to the left lower extremity. She has follow-up with dermatology in 2 weeks. She continues to have IV infusions for her squamous cell carcinoma lesions on her legs. 4/28; the patient has bilateral lower extremity wounds predominantly on the right lateral upper leg, right anterior lower leg which in itself is probably a recurrent cancer as well as the left lateral lower leg. She has recurrent squamous cell  carcinoma currently being treated with an IV infusion. She also has scattered nodules on her upper lower legs and thighs I am not certain of all of this is felt to be malignant as well We have been using silver alginate on the open areas wrapping her legs. She also has her external compression pumps at home but I am not clear that she is going to use these 5/2; patient presents for follow-up. She has not been taking her diuretics as prescribed. She sees Dr. Dian Situ, dermatology at the end of the week. She tolerated the compression wraps well today. She has her juxta lite compressions. 5/8; patient presents for follow-up. She saw Dr. Dian Situ, dermatology and she has nothing to report on. She has been using her juxta lite compression to the right lower extremity. She has tolerated the compression wrap well in the left lower extremity. She has not been taking her diuretic. She is scheduled to continue her infusions for her squamous cell carcinoma on her legs. 5/18; patient presents for follow-up. We wrapped her in 3 layer compression at last clinic visit. She has no issues or complaints today. 5/22; patient presents for follow-up. She has been wrapped in 3 layer compression bilaterally to her lower extremities over the past week. She has been scratching at the top of the wrap and picking at her skin. This area has increased warmth and erythema. 5/30; patient presents for follow-up. She completed Keflex with resolution of her symptoms to the right lower extremity. She no longer has increased warmth or erythema. She continues her infusions for her squamous cell carcinoma lesions. We continue to wrap her in 3 layer compression with silver alginate. She currently denies signs of infection. 6/8; the patient went to see Dr. Ronnald Ramp of dermatology 2 days ago unfortunately we do not have his notes. They removed her compression wraps of course but did not adequately replace them. She comes in today with 3 wounds on the  right lateral upper calf 1 medially below the obvious squamous cell carcinoma there is a large necrotic wound on the left lateral tibial area on the left and several blisters. The patient remains on Cemiplimad infusions for the squamous cell carcinoma bilaterally. She is not felt to be a good candidate for radiation. Some of the skin changes superiorly in the upper legs bilaterally according to Dr. Ronnald Ramp related to these infusions the same like fleshy nodules to me not blisters Fenstermaker, Airyana A ([id number]) [phone].pdf Page 8 of 12 6/13; patient presents for follow-up. We have been using silver alginate under 3 layer compression bilaterally. She  has no issues or complaints today. 6/19; patient presents for follow-up. She will see her oncologist in 3 days and next week her dermatologist. She has no new complaints today. We are using silver alginate under 3 layer compression bilaterally. 6/29; patient presents for follow-up. She she saw Dr. Darrin Nipper last week and States she received injections to the lesions on her legs. We will ask for office visit records. She could not be wrapped with compression and has more drainage on exam today. 7/6; patient presents for follow-up. She continues to receive injections to the lesions in her legs by her dermatologist. She had this done earlier today. She has no issues or complaints today. 7/13; patient presents for follow-up. She is following with Dr.Kavuri once weekly for treatment of her SCCs/KAs with IL[id number]. We have been wrapping her with silver alginate under Kerlix/Coban. She has no issues or complaints today. 7/20; patient presents for follow-up. She has elected to stop doing weekly treatments for her squamous cell carcinoma lesions. She states she experiences too much pain from the injections. She is continuing her immunotherapy infusions. Next 1 is scheduled in August. T the legs we have been using silver alginate o under  Kerlix/Coban. Her wounds actually look better today. 7/27; patient presents for follow-up. We have been using Medihoney and silver alginate under compression therapy. She has no issues or complaints today. 8/1; patient presents for follow-up. We have been using Medihoney and silver alginate under compression therapy. She reports no issues. She is scheduled for her infusion for her SCC later this week. 8/22; patient presents for follow-up. We have been using Medihoney and silver alginate under compression therapy. She reports no issues today. She has moved back home from the skilled nursing facility. She has home health that will be coming out to do OT and PT She would like for them to also do wound . care. 9/18; patient presents for follow-up. We have been using Medihoney and silver alginate under compression therapy. Home health is no longer coming out for wound care. She comes in weekly for wrap changes in our office During nurse visits. 10/2; patient presents for follow-up. We have been using silver alginate under compression therapy. She has no issues or complaints today. She states she is doing her last chemoinfusion next week. 10/23; Patient presents for follow-up. We continue to use silver alginate under compression therapy. She has a few more wounds present today. She states she is taking her lasix as prescribed. She has completed her infusions for her SCC lesions. Patient History Information obtained from Patient. Family History Unknown History. Social History Never smoker, Marital Status - Single, Alcohol Use - Never, Drug Use - No History, Caffeine Use - Never. Medical History Eyes Denies history of Cataracts, Optic Neuritis Ear/Nose/Mouth/Throat Denies history of Chronic sinus problems/congestion, Middle ear problems Hematologic/Lymphatic Patient has history of Anemia Denies history of Hemophilia, Human Immunodeficiency Virus, Lymphedema, Sickle Cell  Disease Respiratory Denies history of Aspiration, Asthma, Chronic Obstructive Pulmonary Disease (COPD), Pneumothorax, Sleep Apnea, Tuberculosis Cardiovascular Patient has history of Arrhythmia - Atrial Flutter, A fibb, Congestive Heart Failure, Hypertension Denies history of Angina, Coronary Artery Disease, Deep Vein Thrombosis, Hypotension, Myocardial Infarction, Peripheral Arterial Disease, Peripheral Venous Disease, Phlebitis, Vasculitis Gastrointestinal Patient has history of Colitis Denies history of Cirrhosis , Crohnoos, Hepatitis A, Hepatitis B, Hepatitis C Endocrine Denies history of Type I Diabetes, Type II Diabetes Genitourinary Denies history of End Stage Renal Disease Immunological Denies history of Lupus Erythematosus, Raynaudoos, Scleroderma Integumentary (Skin) Denies history  of History of Burn Musculoskeletal Patient has history of Osteoarthritis Denies history of Gout, Rheumatoid Arthritis, Osteomyelitis Neurologic Denies history of Dementia, Neuropathy, Quadriplegia, Paraplegia, Seizure Disorder Oncologic Denies history of Received Chemotherapy, Received Radiation Hospitalization/Surgery History - removal of rod left leg. Medical A Surgical History Notes nd Cardiovascular hyperlipidemia Endocrine hypothyroidism Neurologic lumbar spindylolysis Oncologic BLE squamous ceel carcionoma SIDONIA, NUTTER A ([id number]) [id number] Page 9 of 12 Objective Constitutional respirations regular, non-labored and within target range for patient.. Vitals Time Taken: 1:20 AM, Height: 65 in, Weight: 163 lbs, BMI: 27.1, Temperature: 97.6 F, Pulse: 75 bpm, Respiratory Rate: 20 breaths/min, Blood Pressure: 122/75 mmHg. Psychiatric pleasant and cooperative. General Notes: Multiple scattered open wounds to her lower extremities bilaterally limited to skin breakdown. Areas suspicious for malignancy. No surrounding signs of infection. Adequate edema  control. Integumentary (Hair, Skin) Wound #20 status is Open. Original cause of wound was Gradually Appeared. The date acquired was: 06/25/2021. The wound has been in treatment 19 weeks. The wound is located on the Right,Circumferential Lower Leg. The wound measures 16cm length x 26cm width x 0.1cm depth; 326.726cm^2 area and 32.673cm^3 volume. There is Fat Layer (Subcutaneous Tissue) exposed. There is no tunneling or undermining noted. There is a medium amount of serosanguineous drainage noted. The wound margin is distinct with the outline attached to the wound base. There is large (67-100%) red, pink granulation within the wound bed. There is a small (1-33%) amount of necrotic tissue within the wound bed including Adherent Slough. The periwound skin appearance did not exhibit: Callus, Crepitus, Excoriation, Induration, Rash, Scarring, Dry/Scaly, Maceration, Atrophie Blanche, Cyanosis, Ecchymosis, Hemosiderin Staining, Mottled, Pallor, Rubor, Erythema. Wound #7RR status is Open. Original cause of wound was Blister. The date acquired was: 04/20/2020. The wound has been in treatment 74 weeks. The wound is located on the Left,Circumferential Lower Leg. The wound measures 16cm length x 23cm width x 0.1cm depth; 289.027cm^2 area and 28.903cm^3 volume. There is no tunneling or undermining noted. There is a medium amount of serosanguineous drainage noted. The wound margin is distinct with the outline attached to the wound base. There is medium (34-66%) red, pink granulation within the wound bed. There is a medium (34-66%) amount of necrotic tissue within the wound bed including Adherent Slough. The periwound skin appearance exhibited: Dry/Scaly, Maceration, Ecchymosis. The periwound skin appearance did not exhibit: Callus, Crepitus, Excoriation, Induration, Rash, Scarring, Atrophie Blanche, Cyanosis, Hemosiderin Staining, Mottled, Pallor, Rubor, Erythema. Assessment Active Problems ICD-10 Squamous cell  carcinoma of skin, unspecified Non-pressure chronic ulcer of other part of right lower leg with fat layer exposed Non-pressure chronic ulcer of other part of left lower leg with fat layer exposed Lymphedema, not elsewhere classified Chronic venous hypertension (idiopathic) with ulcer of bilateral lower extremity Patient's wounds are stable. I recommended continuing silver alginate under compression therapy to the lower extremities bilaterally. This is a palliative care case. Plan Follow-up Appointments: Return appointment in 3 weeks. - Dr. Heber [place] Nurse Visit: - Weekly and every 3 weeks see Dr. Heber [place]. Bathing/ Shower/ Hygiene: May shower with protection but do not get wound dressing(s) wet. May shower and wash wound with soap and water. - with dressing changes may wash wounds with soap and water. Edema Control - Lymphedema / SCD / Other: Lymphedema Pumps. Use Lymphedema pumps on leg(s) 2-3 times a day for 45-60 minutes. If wearing any wraps or hose, do not remove them. Continue exercising as instructed. - use one hour each time twice a day. Elevate legs to the level of the  heart or above for 30 minutes daily and/or when sitting, a frequency of: - elevate the legs throughout the day heart level if possible. Avoid standing for long periods of time. Exercise regularly Additional Orders / Instructions: Follow Nutritious Diet WOUND #20: - Lower Leg Wound Laterality: Right, Circumferential Cleanser: Soap and Water 1 x Per Week/30 Days Discharge Instructions: May shower and wash wound with dial antibacterial soap and water prior to dressing change. Cleanser: Wound Cleanser 1 x Per Week/30 Days Discharge Instructions: Cleanse the wound with wound cleanser prior to applying a clean dressing using gauze sponges, not tissue or cotton balls. Peri-Wound Care: cetaphil lotion 1 x Per Week/30 Days Discharge Instructions: patient to provide to nurses to apply to both legs. DANELLY, HASSINGER A ([id number])  [id number] Page 10 of 12 Prim Dressing: KerraCel Ag Gelling Fiber Dressing, 4x5 in (silver alginate) 1 x Per Week/30 Days ary Discharge Instructions: Apply silver alginate to wound bed as instructed Secondary Dressing: ABD Pad, 8x10 1 x Per Week/30 Days Discharge Instructions: Apply over primary dressing as directed. Com pression Wrap: ThreePress (3 layer compression wrap) 1 x Per Week/30 Days Discharge Instructions: Apply three layer compression as directed. ***PLEASE USE KERLIX INSTEAD OF COTTON.*** WOUND #7RR: - Lower Leg Wound Laterality: Left, Circumferential Cleanser: Soap and Water 1 x Per Week/30 Days Discharge Instructions: May shower and wash wound with dial antibacterial soap and water prior to dressing change. Cleanser: Wound Cleanser 1 x Per Week/30 Days Discharge Instructions: Cleanse the wound with wound cleanser prior to applying a clean dressing using gauze sponges, not tissue or cotton balls. Peri-Wound Care: cetaphil lotion 1 x Per Week/30 Days Discharge Instructions: patient to provide to nurses to apply to both legs. Prim Dressing: KerraCel Ag Gelling Fiber Dressing, 4x5 in (silver alginate) 1 x Per Week/30 Days ary Discharge Instructions: Apply silver alginate to wound bed as instructed Secondary Dressing: ABD Pad, 8x10 1 x Per Week/30 Days Discharge Instructions: Apply over primary dressing as directed. Com pression Wrap: ThreePress (3 layer compression wrap) 1 x Per Week/30 Days Discharge Instructions: Apply three layer compression as directed. ***PLEASE USE KERLIX INSTEAD OF COTTON.*** 1. Silver alginate under 3 layer compression 2. Follow up in one week Electronic Signature(s) Signed: 11/11/2021 3:32:17 PM By: Kalman Shan DO Entered By: Kalman Shan on 11/11/2021 14:41:39 -------------------------------------------------------------------------------- HxROS Details Patient Name: Date of Service: [name], [place]. 11/11/2021  1:30 PM Medical Record Number: [id number] Patient Account Number: [id number] Date of Birth/Sex: Treating RN: 1925-07-24 (88 y.o. [name] Primary Care Provider: [name] Other Clinician: Referring Provider: Treating Provider/Extender: [name] in Treatment: 21 Information Obtained From Patient Eyes Medical History: Negative for: Cataracts; Optic Neuritis Ear/Nose/Mouth/Throat Medical History: Negative for: Chronic sinus problems/congestion; Middle ear problems Hematologic/Lymphatic Medical History: Positive for: Anemia Negative for: Hemophilia; Human Immunodeficiency Virus; Lymphedema; Sickle Cell Disease Respiratory Medical History: Negative for: Aspiration; Asthma; Chronic Obstructive Pulmonary Disease (COPD); Pneumothorax; Sleep Apnea; Tuberculosis Cardiovascular Medical History: Positive for: Arrhythmia - Atrial Flutter, A fibb; Congestive Heart Failure; Hypertension Negative for: Angina; Coronary Artery Disease; Deep Vein Thrombosis; Hypotension; Myocardial Infarction; Peripheral Arterial Disease; Peripheral Venous Disease; Phlebitis; Vasculitis Past Medical History Notes: hyperlipidemia ADIAH, GUERECA A ([id number]) [id number] Page 11 of 12 Gastrointestinal Medical History: Positive for: Colitis Negative for: Cirrhosis ; Crohns; Hepatitis A; Hepatitis B; Hepatitis C Endocrine Medical History: Negative for: Type I Diabetes; Type II Diabetes Past Medical History Notes: hypothyroidism Genitourinary Medical History: Negative for: End Stage Renal Disease Immunological Medical  History: Negative for: Lupus Erythematosus; Raynauds; Scleroderma Integumentary (Skin) Medical History: Negative for: History of Burn Musculoskeletal Medical History: Positive for: Osteoarthritis Negative for: Gout; Rheumatoid Arthritis; Osteomyelitis Neurologic Medical History: Negative for: Dementia;  Neuropathy; Quadriplegia; Paraplegia; Seizure Disorder Past Medical History Notes: lumbar spindylolysis Oncologic Medical History: Negative for: Received Chemotherapy; Received Radiation Past Medical History Notes: BLE squamous ceel carcionoma Immunizations Pneumococcal Vaccine: Received Pneumococcal Vaccination: Yes Received Pneumococcal Vaccination On or After 60th Birthday: No Implantable Devices None Hospitalization / Surgery History Type of Hospitalization/Surgery removal of rod left leg Family and Social History Unknown History: Yes; Never smoker; Marital Status - Single; Alcohol Use: Never; Drug Use: No History; Caffeine Use: Never; Financial Concerns: No; Food, Clothing or Shelter Needs: No; Support System Lacking: No; Transportation Concerns: No Electronic Signature(s) Signed: 11/11/2021 3:32:17 PM By: Kalman Shan DO Signed: 11/11/2021 5:18:37 PM By: Deon Pilling RN, BSN Entered By: Kalman Shan on 11/11/2021 14:36:58 Dobler, Bedie A ([id number]) [id number] Page 12 of 12 -------------------------------------------------------------------------------- SuperBill Details Patient Name: Date of Service: [name], [place]. 11/11/2021 Medical Record Number: [id number] Patient Account Number: [id number] Date of Birth/Sex: Treating RN: 09-Mar-1925 (88 y.o. [name], [name] Primary Care Provider: [name] Other Clinician: Referring Provider: Treating Provider/Extender: [name] in Treatment: 15 Diagnosis Coding ICD-10 Codes Code Description C44.92 Squamous cell carcinoma of skin, unspecified L97.812 Non-pressure chronic ulcer of other part of right lower leg with fat layer exposed L97.822 Non-pressure chronic ulcer of other part of left lower leg with fat layer exposed I89.0 Lymphedema, not elsewhere classified I87.313 Chronic venous hypertension (idiopathic) with ulcer of bilateral lower  extremity Physician Procedures : CPT4 Code Description Modifier [id number] - WC PHYS LEVEL 3 - EST PT ICD-10 Diagnosis Description C44.92 Squamous cell carcinoma of skin, unspecified L97.812 Non-pressure chronic ulcer of other part of right lower leg with fat layer exposed L97.822  Non-pressure chronic ulcer of other part of left lower leg with fat layer exposed I87.313 Chronic venous hypertension (idiopathic) with ulcer of bilateral lower extremity Quantity: 1 Electronic Signature(s) Signed: 11/11/2021 3:32:17 PM By: Kalman Shan DO Entered By: Kalman Shan on 11/11/2021 14:41:59

## 2021-11-12 ENCOUNTER — Telehealth: Payer: Self-pay | Admitting: Internal Medicine

## 2021-11-12 NOTE — Telephone Encounter (Signed)
LVM for pt to rtn my call to schedule AWV with [facility] call back # [phone] 

## 2021-11-14 ENCOUNTER — Other Ambulatory Visit: Payer: Self-pay | Admitting: Internal Medicine

## 2021-11-14 ENCOUNTER — Other Ambulatory Visit: Payer: Self-pay | Admitting: *Deleted

## 2021-11-14 MED ORDER — LEVOTHYROXINE SODIUM 25 MCG PO TABS: 25.0000 ug | ORAL_TABLET | Freq: Every day | ORAL | 0 refills | Status: DC

## 2021-11-14 NOTE — Addendum Note (Signed)
Addended by: Earnstine Regal on: 11/14/2021 02:52 PM   Modules accepted: Orders

## 2021-11-14 NOTE — Progress Notes (Signed)
SHERILEE, SMOTHERMAN [name] ([id number]) [id number] Page 1 of 9 Visit Report for 11/11/2021 Arrival Information Details Patient Name: Date of Service: [name], [name]. 11/11/2021 1:30 PM Medical Record Number: [id number] Patient Account Number: [id number] Date of Birth/Sex: Treating RN: April 07, 1925 (88 y.o. [name], [name] Primary Care [name]: [name] Other Clinician: Referring [name]: Treating [name]: [name] in Treatment: 17 Visit Information History Since Last Visit All ordered tests and consults were completed: No Patient Arrived: [name] Added or deleted any medications: No Arrival Time: 13:26 Any new allergies or adverse reactions: No Transfer Assistance: None Had [name] fall or experienced change in No Patient Identification Verified: No activities of daily living that may affect Secondary Verification Process Completed: No risk of falls: Patient Requires Transmission-Based Precautions: No Signs or symptoms of abuse/neglect since last visito No Patient Has Alerts: Yes Hospitalized since last visit: No Patient Alerts: R ABI = Non compressible Implantable device outside of the clinic excluding No L ABI = Non compressible cellular tissue based products placed in the center since last visit: Pain Present Now: No Electronic Signature(s) Signed: 11/11/2021 3:15:45 PM By: [name] Entered By: [name] on 11/11/2021 13:27:25 -------------------------------------------------------------------------------- Compression Therapy Details Patient Name: Date of Service: [name], [name]. 11/11/2021 1:30 PM Medical Record Number: [id number] Patient Account Number: [id number] Date of Birth/Sex: Treating RN: 14-Apr-1925 (88 y.o. [name] Primary Care [name]: [name] Other Clinician: Referring [name]: Treating [name]/Extender: [name] in  Treatment: 74 Compression Therapy Performed for Wound Assessment: Wound #20 [name],Circumferential Lower Leg Performed By: Clinician [name] Pilling, RN Compression Type: Three Layer Post Procedure Diagnosis Same as Pre-procedure Electronic Signature(s) Signed: 11/14/2021 5:00:15 PM By: [name] Pilling RN, BSN Entered By: [name] on 11/14/2021 09:17:52 Hakanson, [name] ([id number]) [id number] Page 2 of 9 -------------------------------------------------------------------------------- Compression Therapy Details Patient Name: Date of Service: [name], [name]. 11/11/2021 1:30 PM Medical Record Number: [id number] Patient Account Number: [id number] Date of Birth/Sex: Treating RN: 02/23/1925 (88 y.o. [name] Primary Care [name]: [name] Other Clinician: Referring Arlind Klingerman: Treating Keyonta Barradas/Extender: [name] in Treatment: 23 Compression Therapy Performed for Wound Assessment: Wound #7RR Left,Circumferential Lower Leg Performed By: Clinician [name] Pilling, RN Compression Type: Three Layer Post Procedure Diagnosis Same as Pre-procedure Electronic Signature(s) Signed: 11/14/2021 5:00:15 PM By: [name] Pilling RN, BSN Entered By: [name] on 11/14/2021 09:17:52 -------------------------------------------------------------------------------- Encounter Discharge Information Details Patient Name: Date of Service: [name], [name]. 11/11/2021 1:30 PM Medical Record Number: [id number] Patient Account Number: [id number] Date of Birth/Sex: Treating RN: [birth year] (88 y.o. [name] Primary Care [name]: [name] Other Clinician: Referring [name]: Treating [name]: [name] in Treatment: 13 Encounter Discharge Information Items Discharge Condition: Stable Ambulatory Status: Walker Discharge Destination: Home Transportation: Private  Auto Accompanied By: self Schedule Follow-up Appointment: Yes Clinical Summary of Care: Electronic Signature(s) Signed: 11/14/2021 5:00:15 PM By: [name] Pilling RN, BSN Entered By: [name] on 11/14/2021 09:20:40 -------------------------------------------------------------------------------- Multi Wound Chart Details Patient Name: Date of Service: [name], [name]. 11/11/2021 1:30 PM Medical Record Number: [id number] Patient Account Number: [id number] Date of Birth/Sex: Treating RN: 1925-10-19 (88 y.o. [name] Primary Care Lynk Marti: [name] Other Clinician: Referring Shelina Luo: Treating [name]: [name] in Treatment: 47 Vital Signs Height(in): 65 Pulse(bpm): 75 Weight(lbs): 163 Blood Pressure(mmHg): 122/75 Body Mass Index(BMI): 27.1 Temperature(F): 97.6 Respiratory Rate(breaths/min): 20 [name], [name] (  [id number]) [id number] Page 3 of 9 [20:Photos:] [N/[name]:N/[name]] [name], Circumferential Lower Leg Left, Circumferential Lower Leg N/[name] Wound Location: Gradually Appeared Blister N/[name] Wounding Event: Lymphedema Malignant Wound N/[name] Primary Etiology: Anemia, Arrhythmia, Congestive Heart Anemia, Arrhythmia, Congestive Heart N/[name] Comorbid History: Failure, Hypertension, Colitis, Failure, Hypertension, Colitis, Osteoarthritis Osteoarthritis 06/25/2021 04/20/2020 N/[name] Date Acquired: 19 74 N/[name] Weeks of Treatment: Open Open N/[name] Wound Status: No Yes N/[name] Wound Recurrence: Yes Yes N/[name] Clustered Wound: 16 20 N/[name] Clustered Quantity: 16x26x0.1 16x23x0.1 N/[name] Measurements L x W x D (cm) 326.726 289.027 N/[name] [name] (cm) : rea 32.673 28.903 N/[name] Volume (cm) : 17.80% -9311.50% N/[name] % Reduction in Area: 17.80% -9314.70% N/[name] % Reduction in Volume: Full Thickness Without Exposed Full Thickness Without Exposed N/[name] Classification: Support Structures Support Structures Medium Medium N/[name] Exudate  Amount: Serosanguineous Serosanguineous N/[name] Exudate Type: red, brown red, brown N/[name] Exudate Color: Distinct, outline attached Distinct, outline attached N/[name] Wound Margin: Large (67-100%) Medium (34-66%) N/[name] Granulation Amount: Red, Pink Red, Pink N/[name] Granulation Quality: Small (1-33%) Medium (34-66%) N/[name] Necrotic Amount: Fat Layer (Subcutaneous Tissue): Yes Fascia: No N/[name] Exposed Structures: Fascia: No Fat Layer (Subcutaneous Tissue): No Tendon: No Tendon: No Muscle: No Muscle: No Joint: No Joint: No Bone: No Bone: No Small (1-33%) None N/[name] Epithelialization: Excoriation: No Excoriation: No N/[name] Periwound Skin Texture: Induration: No Induration: No Callus: No Callus: No Crepitus: No Crepitus: No Rash: No Rash: No Scarring: No Scarring: No Maceration: No Maceration: Yes N/[name] Periwound Skin Moisture: Dry/Scaly: No Dry/Scaly: Yes Atrophie Blanche: No Ecchymosis: Yes N/[name] Periwound Skin Color: Cyanosis: No Atrophie Blanche: No Ecchymosis: No Cyanosis: No Erythema: No Erythema: No Hemosiderin Staining: No Hemosiderin Staining: No Mottled: No Mottled: No Pallor: No Pallor: No Rubor: No Rubor: No Treatment Notes Electronic Signature(s) Signed: 11/11/2021 3:32:17 PM By: Kalman Shan DO Signed: 11/11/2021 5:18:37 PM By: [name] Pilling RN, BSN Entered By: Kalman Shan on 11/11/2021 14:33:58 -------------------------------------------------------------------------------- Multi-Disciplinary Care Plan Details Patient Name: Date of Service: [name], [name]. 11/11/2021 1:30 PM Medical Record Number: [id number] Patient Account Number: [id number] MERTIE, HASLEM [name] ([id number]) [id number] Page 4 of 9 Date of Birth/Sex: Treating RN: May 19, 1925 (88 y.o. [name], [name] Primary Care Tattiana Fakhouri: Other Clinician: [name] Referring Carzell Saldivar: Treating Kael Keetch/Extender: [name] in  Treatment: 49 Multidisciplinary Care Plan reviewed with physician Active Inactive Venous Leg Ulcer Nursing Diagnoses: Knowledge deficit related to disease process and management Potential for venous Insuffiency (use before diagnosis confirmed) Goals: Patient will maintain optimal edema control Date Initiated: 11/01/2020 Target Resolution Date: 12/20/2021 Goal Status: Active Interventions: Assess peripheral edema status every visit. Compression as ordered Provide education on venous insufficiency Treatment Activities: Therapeutic compression applied : 11/01/2020 Notes: 12/27/20: Edema control ongoing 01/24/21: Edema control continues, using 3 layer compression Wound/Skin Impairment Nursing Diagnoses: Impaired tissue integrity Knowledge deficit related to ulceration/compromised skin integrity Goals: Patient/caregiver will verbalize understanding of skin care regimen Date Initiated: 06/11/2020 Target Resolution Date: 12/20/2021 Goal Status: Active Interventions: Assess patient/caregiver ability to obtain necessary supplies Assess patient/caregiver ability to perform ulcer/skin care regimen upon admission and as needed Assess ulceration(s) every visit Provide education on ulcer and skin care Notes: 10/11/20: Wound care regimen ongoing. 12/27/20: Wound care continues Electronic Signature(s) Signed: 11/14/2021 4:44:37 PM By: Rhae Hammock RN Entered By: Rhae Hammock on 11/11/2021 14:10:54 -------------------------------------------------------------------------------- Pain Assessment Details Patient Name: Date of Service: [name], [place]. 11/11/2021 1:30 PM Medical Record Number: [id number] Patient Account Number: [id number] Date of Birth/Sex: Treating RN: [birth year] (88 y.o. F) [place],  NLGXQ Primary Care Makylah Bossard: [name] Other Clinician: Referring Winni Ehrhard: Treating Debhora Titus/Extender: [name] in Treatment: [address] Duchesneau, [name] ([id number]) [id number] Page 5 of 9 Location of Pain Severity and Description of Pain Patient Has Paino No Site Locations Pain Management and Medication Current Pain Management: Electronic Signature(s) Signed: 11/11/2021 3:15:45 PM By: [name] Signed: 11/11/2021 5:18:37 PM By: [name] Pilling RN, BSN Entered By: [name] on 11/11/2021 13:28:02 -------------------------------------------------------------------------------- Patient/Caregiver Education Details Patient Name: Date of Service: [name], Tinzley [name]. 10/23/2023andnbsp1:30 PM Medical Record Number: [id number] Patient Account Number: [id number] Date of Birth/Gender: Treating RN: Aug 13, 1925 (88 y.o. [name], [name] Primary Care Physician: [name] Other Clinician: Referring Physician: Treating Physician/Extender: [name] in Treatment: 70 Education Assessment Education Provided To: Patient Education Topics Provided Venous: Methods: Explain/Verbal Responses: Reinforcements needed, State content correctly Electronic Signature(s) Signed: 11/14/2021 4:44:37 PM By: Rhae Hammock RN Entered By: Rhae Hammock on 11/11/2021 14:11:18 Wound Assessment Details -------------------------------------------------------------------------------- [name] ([id number]) [id number] Page 6 of 9 Patient Name: Date of Service: [name], [name]. 11/11/2021 1:30 PM Medical Record Number: [id number] Patient Account Number: [id number] Date of Birth/Sex: Treating RN: [birth year] (88 y.o. [name], [name] Primary Care Webb Weed: [name] Other Clinician: Referring Rosamae Rocque: Treating Nathali Vent/Extender: [name] in Treatment: 64 Wound Status Wound Number: 20 Primary Lymphedema Etiology: Wound Location: [name], Circumferential Lower Leg Wound Open Wounding  Event: Gradually Appeared Status: Date Acquired: 06/25/2021 Comorbid Anemia, Arrhythmia, Congestive Heart Failure, Hypertension, Weeks Of Treatment: 19 History: Colitis, Osteoarthritis Clustered Wound: Yes Photos Wound Measurements Length: (cm) 16 % Reduction in Area: 17.8% Width: (cm) 26 % Reduction in Volume: 17.8% Depth: (cm) 0.1 Epithelialization: Small (1-33%) Clustered Quantity: 16 Tunneling: No Area: (cm) 326.726 Undermining: No Volume: (cm) 32.673 Wound Description Classification: Full Thickness Without Exposed Support Structures Foul Odor After Cleansing: No Wound Margin: Distinct, outline attached Slough/Fibrino No Exudate Amount: Medium Exudate Type: Serosanguineous Exudate Color: red, brown Wound Bed Granulation Amount: Large (67-100%) Exposed Structure Granulation Quality: Red, Pink Fascia Exposed: No Necrotic Amount: Small (1-33%) Fat Layer (Subcutaneous Tissue) Exposed: Yes Necrotic Quality: Adherent Slough Tendon Exposed: No Muscle Exposed: No Joint Exposed: No Bone Exposed: No Periwound Skin Texture Texture Color No Abnormalities Noted: No No Abnormalities Noted: No Callus: No Atrophie Blanche: No Crepitus: No Cyanosis: No Excoriation: No Ecchymosis: No Induration: No Erythema: No Rash: No Hemosiderin Staining: No Scarring: No Mottled: No Pallor: No Moisture Rubor: No No Abnormalities Noted: No Dry / Scaly: No Maceration: No Treatment Notes Wound #20 (Lower Leg) Wound Laterality: [name], Circumferential Cleanser Soap and Water AILEY, [name] ([id number]) [id number] Page 7 of 9 Discharge Instruction: May shower and wash wound with dial antibacterial soap and water prior to dressing change. Wound Cleanser Discharge Instruction: Cleanse the wound with wound cleanser prior to applying [name] clean dressing using gauze sponges, not tissue or cotton balls. Peri-Wound Care cetaphil lotion Discharge Instruction:  patient to provide to nurses to apply to both legs. Topical Primary Dressing KerraCel Ag Gelling Fiber Dressing, 4x5 in (silver alginate) Discharge Instruction: Apply silver alginate to wound bed as instructed Secondary Dressing ABD Pad, 8x10 Discharge Instruction: Apply over primary dressing as directed. Secured With Compression Wrap ThreePress (3 layer compression wrap) Discharge Instruction: Apply three layer compression as directed. ***PLEASE USE KERLIX INSTEAD OF COTTON.*** Compression Stockings Add-Ons Electronic Signature(s) Signed: 11/11/2021 4:47:44 PM By: Erenest Blank Signed: 11/11/2021 5:18:37 PM By: [name] Pilling RN,  BSN Entered By: Erenest Blank on 11/11/2021 14:04:30 -------------------------------------------------------------------------------- Wound Assessment Details Patient Name: Date of Service: [name], [name]. 11/11/2021 1:30 PM Medical Record Number: [id number] Patient Account Number: [id number] Date of Birth/Sex: Treating RN: 12-Oct-1925 (88 y.o. [name], [name] Primary Care Bradleigh Sonnen: [name] Other Clinician: Referring Mega Kinkade: Treating Cynitha Berte/Extender: [name] in Treatment: 50 Wound Status Wound Number: [id number] Primary Malignant Wound Etiology: Wound Location: Left, Circumferential Lower Leg Wound Open Wounding Event: Blister Status: Date Acquired: 04/20/2020 Comorbid Anemia, Arrhythmia, Congestive Heart Failure, Hypertension, Weeks Of Treatment: 74 History: Colitis, Osteoarthritis Clustered Wound: Yes Photos Wound Measurements Length: (cm) 16 Width: (cm) 23 Head, [name] ([id number]) Depth: (cm) 0.1 Clustered Quantity: 20 Area: (cm) 289.027 Volume: (cm) 28.903 % Reduction in Area: -9311.5% % Reduction in Volume: -9314.7% [id number] Page 8 of 9 Epithelialization: None Tunneling: No Undermining: No Wound Description Classification: Full Thickness Without  Exposed Sup Wound Margin: Distinct, outline attached Exudate Amount: Medium Exudate Type: Serosanguineous Exudate Color: red, brown port Structures Foul Odor After Cleansing: No Slough/Fibrino Yes Wound Bed Granulation Amount: Medium (34-66%) Exposed Structure Granulation Quality: Red, Pink Fascia Exposed: No Necrotic Amount: Medium (34-66%) Fat Layer (Subcutaneous Tissue) Exposed: No Necrotic Quality: Adherent Slough Tendon Exposed: No Muscle Exposed: No Joint Exposed: No Bone Exposed: No Periwound Skin Texture Texture Color No Abnormalities Noted: No No Abnormalities Noted: No Callus: No Atrophie Blanche: No Crepitus: No Cyanosis: No Excoriation: No Ecchymosis: Yes Induration: No Erythema: No Rash: No Hemosiderin Staining: No Scarring: No Mottled: No Pallor: No Moisture Rubor: No No Abnormalities Noted: No Dry / Scaly: Yes Maceration: Yes Treatment Notes Wound #7RR (Lower Leg) Wound Laterality: Left, Circumferential Cleanser Soap and Water Discharge Instruction: May shower and wash wound with dial antibacterial soap and water prior to dressing change. Wound Cleanser Discharge Instruction: Cleanse the wound with wound cleanser prior to applying [name] clean dressing using gauze sponges, not tissue or cotton balls. Peri-Wound Care cetaphil lotion Discharge Instruction: patient to provide to nurses to apply to both legs. Topical Primary Dressing KerraCel Ag Gelling Fiber Dressing, 4x5 in (silver alginate) Discharge Instruction: Apply silver alginate to wound bed as instructed Secondary Dressing ABD Pad, 8x10 Discharge Instruction: Apply over primary dressing as directed. Secured With Compression Wrap ThreePress (3 layer compression wrap) Discharge Instruction: Apply three layer compression as directed. ***PLEASE USE KERLIX INSTEAD OF COTTON.*** Compression Stockings Add-Ons Electronic Signature(s) Signed: 11/11/2021 4:47:44 PM By: Erenest Blank Signed:  11/11/2021 5:18:37 PM By: [name] Pilling RN, BSN Entered By: Erenest Blank on 11/11/2021 14:03:53 Payano, Anaih [name] ([id number]) [id number] Page 9 of 9 -------------------------------------------------------------------------------- Vitals Details Patient Name: Date of Service: [name], [name]. 11/11/2021 1:30 PM Medical Record Number: [id number] Patient Account Number: [id number] Date of Birth/Sex: Treating RN: [birth year] (88 y.o. [name], [name] Primary Care Tayvia Faughnan: [name] Other Clinician: Referring Serena Petterson: Treating Taquanna Borras/Extender: [name] in Treatment: 54 Vital Signs Time Taken: 01:20 Temperature (F): 97.6 Height (in): 65 Pulse (bpm): 75 Weight (lbs): 163 Respiratory Rate (breaths/min): 20 Body Mass Index (BMI): 27.1 Blood Pressure (mmHg): 122/75 Reference Range: 80 - 120 mg / dl Electronic Signature(s) Signed: 11/11/2021 3:15:45 PM By: [name] Entered By: [name] on 11/11/2021 13:27:53

## 2021-11-18 ENCOUNTER — Encounter (HOSPITAL_BASED_OUTPATIENT_CLINIC_OR_DEPARTMENT_OTHER): Payer: Medicare Other | Admitting: Internal Medicine

## 2021-11-18 DIAGNOSIS — I872 Venous insufficiency (chronic) (peripheral): Secondary | ICD-10-CM | POA: Diagnosis not present

## 2021-11-18 DIAGNOSIS — I110 Hypertensive heart disease with heart failure: Secondary | ICD-10-CM | POA: Diagnosis not present

## 2021-11-18 DIAGNOSIS — M1990 Unspecified osteoarthritis, unspecified site: Secondary | ICD-10-CM | POA: Diagnosis not present

## 2021-11-18 DIAGNOSIS — L97812 Non-pressure chronic ulcer of other part of right lower leg with fat layer exposed: Secondary | ICD-10-CM | POA: Diagnosis not present

## 2021-11-18 DIAGNOSIS — L97822 Non-pressure chronic ulcer of other part of left lower leg with fat layer exposed: Secondary | ICD-10-CM | POA: Diagnosis not present

## 2021-11-18 DIAGNOSIS — I890 Lymphedema, not elsewhere classified: Secondary | ICD-10-CM | POA: Diagnosis not present

## 2021-11-18 NOTE — Progress Notes (Signed)
[name], [name] ([id number]) [id number] Page 1 of 4 Visit Report for 11/18/2021 Arrival Information Details Patient Name: Date of Service: [name], [name] 11/18/2021 1:15 PM Medical Record Number: [id number] Patient Account Number: [id number] Date of Birth/Sex: Treating RN: 1925-11-29 (88 y.o. [name], [name] Primary Care [name]: [name] Other Clinician: Referring [name]: Treating [name]/Extender: [name] in Treatment: 63 Visit Information History Since Last Visit Added or deleted any medications: No Patient Arrived: Walker Any new allergies or adverse reactions: No Arrival Time: 13:37 Had Bell fall or experienced change in No Accompanied By: self activities of daily living that may affect Transfer Assistance: None risk of falls: Patient Identification Verified: Yes Signs or symptoms of abuse/neglect since last visito No Secondary Verification Process Completed: Yes Hospitalized since last visit: No Patient Requires Transmission-Based No Implantable device outside of the clinic excluding No Precautions: cellular tissue based products placed in the center Patient Has Alerts: Yes since last visit: Patient Alerts: R ABI = Non compressible Has Dressing in Place as Prescribed: Yes L ABI = Non Has Compression in Place as Prescribed: Yes compressible Pain Present Now: No Electronic Signature(s) Signed: 11/18/2021 4:53:44 PM By: Deon Pilling RN, BSN Entered By: Deon Pilling on 11/18/2021 13:54:20 -------------------------------------------------------------------------------- Compression Therapy Details Patient Name: Date of Service: [name], [name]. 11/18/2021 1:15 PM Medical Record Number: [id number] Patient Account Number: [id number] Date of Birth/Sex: Treating RN: October 29, 1925 (88 y.o. [name] Primary Care [name]: [name] Other Clinician: Referring [name]: Treating  [name]/Extender: [name] in Treatment: 67 Compression Therapy Performed for Wound Assessment: Wound #20 Bell,Circumferential Lower Leg Performed By: Clinician Deon Pilling, RN Compression Type: Three Hydrologist) Signed: 11/18/2021 4:53:44 PM By: Deon Pilling RN, BSN Entered By: Deon Pilling on 11/18/2021 13:54:47 -------------------------------------------------------------------------------- Compression Therapy Details Patient Name: Date of Service: [name], [name]. 11/18/2021 1:15 PM Medical Record Number: [id number] Patient Account Number: [id number] [name], [name] ([id number]) [id number] Page 2 of 4 Date of Birth/Sex: Treating RN: May 26, 1925 (88 y.o. [name] Primary Care [name]: Other Clinician: [name] Referring [name]: Treating [name]/Extender: [name] in Treatment: 64 Compression Therapy Performed for Wound Assessment: Wound #7RR Left,Circumferential Lower Leg Performed By: Clinician Deon Pilling, RN Compression Type: Three Hydrologist) Signed: 11/18/2021 4:53:44 PM By: Deon Pilling RN, BSN Entered By: Deon Pilling on 11/18/2021 13:54:47 -------------------------------------------------------------------------------- Encounter Discharge Information Details Patient Name: Date of Service: [name], [name] 11/18/2021 1:15 PM Medical Record Number: [id number] Patient Account Number: [id number] Date of Birth/Sex: Treating RN: May 16, 1925 (88 y.o. [name] Primary Care Jancarlos Thrun: [name] Other Clinician: Referring [name]: Treating [name]/Extender: [name] in Treatment: 95 Encounter Discharge Information Items Discharge Condition: Stable Ambulatory Status: Walker Discharge Destination: Home Transportation: Private Auto Accompanied By:  self Schedule Follow-up Appointment: Yes Clinical Summary of Care: Electronic Signature(s) Signed: 11/18/2021 4:53:44 PM By: Deon Pilling RN, BSN Entered By: Deon Pilling on 11/18/2021 13:55:10 -------------------------------------------------------------------------------- Wound Assessment Details Patient Name: Date of Service: [name], [name]. 11/18/2021 1:15 PM Medical Record Number: [id number] Patient Account Number: [id number] Date of Birth/Sex: Treating RN: 10-13-1925 (88 y.o. [name] Primary Care [name]: [name] Other Clinician: Referring [name]: Treating [name]/Extender: [name] in Treatment: 79 Wound Status Wound Number: 20 Primary Etiology: Lymphedema Wound Location: Bell, Circumferential Lower Leg Wound Status: Open Wounding Event: Gradually Appeared Date Acquired: 06/25/2021 Weeks  Of Treatment: 20 Clustered Wound: Yes Wound Measurements Length: (cm) 16 Width: (cm) 26 Depth: (cm) 0.1 Area: (cm) 326.726 Volume: (cm) 32.673 [name], [name] ([id number]) % Reduction in Area: 17.8% % Reduction in Volume: 17.8% [id number] Page 3 of 4 Wound Description Classification: Full Thickness Without Exposed Suppor Exudate Amount: Medium Exudate Type: Serosanguineous Exudate Color: red, brown t Structures Periwound Skin Texture Texture Color No Abnormalities Noted: No No Abnormalities Noted: No Moisture No Abnormalities Noted: No Treatment Notes Wound #20 (Lower Leg) Wound Laterality: Bell, Circumferential Cleanser Soap and Water Discharge Instruction: May shower and wash wound with dial antibacterial soap and water prior to dressing change. Wound Cleanser Discharge Instruction: Cleanse the wound with wound cleanser prior to applying Bell clean dressing using gauze sponges, not tissue or cotton balls. Peri-Wound Care cetaphil lotion Discharge Instruction: patient to provide to  nurses to apply to both legs. Topical Primary Dressing KerraCel Ag Gelling Fiber Dressing, 4x5 in (silver alginate) Discharge Instruction: Apply silver alginate to wound bed as instructed Secondary Dressing ABD Pad, 8x10 Discharge Instruction: Apply over primary dressing as directed. Secured With Compression Wrap ThreePress (3 layer compression wrap) Discharge Instruction: Apply three layer compression as directed. ***PLEASE USE KERLIX INSTEAD OF COTTON.*** Compression Stockings Add-Ons Electronic Signature(s) Signed: 11/18/2021 4:53:44 PM By: Deon Pilling RN, BSN Entered By: Deon Pilling on 11/18/2021 13:54:35 -------------------------------------------------------------------------------- Wound Assessment Details Patient Name: Date of Service: [name], [place] 11/18/2021 1:15 PM Medical Record Number: [id number] Patient Account Number: [id number] Date of Birth/Sex: Treating RN: June 25, 1925 (88 y.o. [name] Primary Care Rooney Swails: [name] Other Clinician: Referring Densil Ottey: Treating [name]/Extender: [name] in Treatment: 13 Wound Status Wound Number: 7RR Primary Etiology: Malignant Wound Wound Location: Left, Circumferential Lower Leg Wound Status: Open Wounding Event: Blister Date Acquired: 04/20/2020 [name], [name] ([id number]) [id number].pdf Page 4 of 4 Weeks Of Treatment: 75 Clustered Wound: Yes Wound Measurements Length: (cm) 16 Width: (cm) 23 Depth: (cm) 0.1 Area: (cm) 289.027 Volume: (cm) 28.903 % Reduction in Area: -9311.5% % Reduction in Volume: -9314.7% Wound Description Classification: Full Thickness Without Exposed Suppor Exudate Amount: Medium Exudate Type: Serosanguineous Exudate Color: red, brown t Structures Periwound Skin Texture Texture Color No Abnormalities Noted: No No Abnormalities Noted: No Moisture No Abnormalities Noted: No Treatment Notes Wound #7RR  (Lower Leg) Wound Laterality: Left, Circumferential Cleanser Soap and Water Discharge Instruction: May shower and wash wound with dial antibacterial soap and water prior to dressing change. Wound Cleanser Discharge Instruction: Cleanse the wound with wound cleanser prior to applying Bell clean dressing using gauze sponges, not tissue or cotton balls. Peri-Wound Care cetaphil lotion Discharge Instruction: patient to provide to nurses to apply to both legs. Topical Primary Dressing KerraCel Ag Gelling Fiber Dressing, 4x5 in (silver alginate) Discharge Instruction: Apply silver alginate to wound bed as instructed Secondary Dressing ABD Pad, 8x10 Discharge Instruction: Apply over primary dressing as directed. Secured With Compression Wrap ThreePress (3 layer compression wrap) Discharge Instruction: Apply three layer compression as directed. ***PLEASE USE KERLIX INSTEAD OF COTTON.*** Compression Stockings Add-Ons Electronic Signature(s) Signed: 11/18/2021 4:53:44 PM By: Deon Pilling RN, BSN Entered By: Deon Pilling on 11/18/2021 13:54:35

## 2021-11-18 NOTE — Progress Notes (Signed)
TAYJAH, LOBDELL A ([id number]) [id number] Page 1 of 1 Visit Report for 11/18/2021 SuperBill Details Patient Name: Date of Service: [name], [place]. 11/18/2021 Medical Record Number: [id number] Patient Account Number: [id number] Date of Birth/Sex: Treating RN: Jun 07, 1925 (88 y.o. [name], [name] Primary Care Provider: [name] Other Clinician: Referring Provider: Treating Provider/Extender: [name] in Treatment: 81 Diagnosis Coding ICD-10 Codes Code Description C44.92 Squamous cell carcinoma of skin, unspecified L97.812 Non-pressure chronic ulcer of other part of right lower leg with fat layer exposed L97.822 Non-pressure chronic ulcer of other part of left lower leg with fat layer exposed I89.0 Lymphedema, not elsewhere classified I87.313 Chronic venous hypertension (idiopathic) with ulcer of bilateral lower extremity Facility Procedures CPT4 Description Modifier Quantity Code 44920100 [id number] BILATERAL: Application of multi-layer venous compression system; leg (below knee), including ankle and 1 foot. Electronic Signature(s) Signed: 11/18/2021 2:03:41 PM By: Kalman Shan DO Signed: 11/18/2021 4:53:44 PM By: Deon Pilling RN, BSN Entered By: Deon Pilling on 11/18/2021 13:55:17

## 2021-11-19 NOTE — Progress Notes (Addendum)
Erroneous encounter

## 2021-11-19 NOTE — Patient Instructions (Signed)

## 2021-11-20 ENCOUNTER — Ambulatory Visit (INDEPENDENT_AMBULATORY_CARE_PROVIDER_SITE_OTHER): Payer: Medicare Other | Admitting: *Deleted

## 2021-11-20 DIAGNOSIS — Z0000 Encounter for general adult medical examination without abnormal findings: Secondary | ICD-10-CM

## 2021-11-21 ENCOUNTER — Telehealth: Payer: Self-pay | Admitting: Internal Medicine

## 2021-11-21 NOTE — Telephone Encounter (Signed)
Daughter & POA Raul Del called to ask  2 questions:  Does patient need to get the RSV shot.  2.   IF patient has not had the shingles shot, can provider fax or send order to [facility], on-site health clinic.  So she can get it done there.  Pam's phone [phone]

## 2021-11-22 NOTE — Telephone Encounter (Unsigned)
Notified daughter w/MD response. She want msg sent to [facility], informed daughter we will need fax # to send. She will call back on Monday w/ #. Also want to ask MD mom has not taken the prolia in a while is it neccessary for her to take. Made mom appt for 12/31/21.Marland KitchenJohny Chess

## 2021-11-22 NOTE — Telephone Encounter (Signed)
1.  Okay RSV injection 2.  I do not have any documentation of Shingrix.  Okay to fax the order. Thanks

## 2021-11-23 NOTE — Telephone Encounter (Signed)
We can restart Prolia if [name] is willing.  There is no data that supports the use of Prolia in our patients over 90 and there is no data that prevents [place] from using it.  [name] should continue with vitamin D.  Thanks

## 2021-11-25 NOTE — Telephone Encounter (Signed)
Daughter called to give provider fax number for orders to patient to receive vaccines at [facility] .  RSV  & SHINGLES orders: Fax to [facility]. They send everything to [facility] from there in [place].  FAX# [phone].

## 2021-11-25 NOTE — Telephone Encounter (Signed)
Order was faxed to FAX# [phone], also inform daughter MD response on prolia injection.Marland KitchenJohny Chess

## 2021-11-28 ENCOUNTER — Encounter (HOSPITAL_BASED_OUTPATIENT_CLINIC_OR_DEPARTMENT_OTHER): Payer: Medicare Other | Attending: Internal Medicine | Admitting: Internal Medicine

## 2021-11-28 DIAGNOSIS — I110 Hypertensive heart disease with heart failure: Secondary | ICD-10-CM | POA: Diagnosis not present

## 2021-11-28 DIAGNOSIS — Z85828 Personal history of other malignant neoplasm of skin: Secondary | ICD-10-CM | POA: Insufficient documentation

## 2021-11-28 DIAGNOSIS — C4492 Squamous cell carcinoma of skin, unspecified: Secondary | ICD-10-CM | POA: Diagnosis not present

## 2021-11-28 DIAGNOSIS — L97822 Non-pressure chronic ulcer of other part of left lower leg with fat layer exposed: Secondary | ICD-10-CM | POA: Diagnosis not present

## 2021-11-28 DIAGNOSIS — I87313 Chronic venous hypertension (idiopathic) with ulcer of bilateral lower extremity: Secondary | ICD-10-CM | POA: Diagnosis not present

## 2021-11-28 DIAGNOSIS — Z853 Personal history of malignant neoplasm of breast: Secondary | ICD-10-CM | POA: Diagnosis not present

## 2021-11-28 DIAGNOSIS — E039 Hypothyroidism, unspecified: Secondary | ICD-10-CM | POA: Diagnosis not present

## 2021-11-28 DIAGNOSIS — Z79899 Other long term (current) drug therapy: Secondary | ICD-10-CM | POA: Insufficient documentation

## 2021-11-28 DIAGNOSIS — E785 Hyperlipidemia, unspecified: Secondary | ICD-10-CM | POA: Insufficient documentation

## 2021-11-28 DIAGNOSIS — K529 Noninfective gastroenteritis and colitis, unspecified: Secondary | ICD-10-CM | POA: Diagnosis not present

## 2021-11-28 DIAGNOSIS — I890 Lymphedema, not elsewhere classified: Secondary | ICD-10-CM | POA: Insufficient documentation

## 2021-11-28 DIAGNOSIS — M1990 Unspecified osteoarthritis, unspecified site: Secondary | ICD-10-CM | POA: Diagnosis not present

## 2021-11-28 DIAGNOSIS — I509 Heart failure, unspecified: Secondary | ICD-10-CM | POA: Diagnosis not present

## 2021-11-28 DIAGNOSIS — L97812 Non-pressure chronic ulcer of other part of right lower leg with fat layer exposed: Secondary | ICD-10-CM | POA: Diagnosis not present

## 2021-11-28 NOTE — Progress Notes (Signed)
JESTINA, STEPHANI A ([id number]) [id number] Page 1 of 1 Visit Report for 11/28/2021 SuperBill Details Patient Name: Date of Service: [name], [place]. 11/28/2021 Medical Record Number: [id number] Patient Account Number: [id number] Date of Birth/Sex: Treating RN: 1925/05/25 (88 y.o. [name], [name] Primary Care Provider: [name] Other Clinician: Referring Provider: Treating Provider/Extender: [name] in Treatment: 65 Diagnosis Coding ICD-10 Codes Code Description C44.92 Squamous cell carcinoma of skin, unspecified L97.812 Non-pressure chronic ulcer of other part of right lower leg with fat layer exposed L97.822 Non-pressure chronic ulcer of other part of left lower leg with fat layer exposed I89.0 Lymphedema, not elsewhere classified I87.313 Chronic venous hypertension (idiopathic) with ulcer of bilateral lower extremity Facility Procedures CPT4 Description Modifier Quantity Code [id number] BILATERAL: Application of multi-layer venous compression system; leg (below knee), including ankle and 1 foot. Electronic Signature(s) Signed: 11/28/2021 3:27:23 PM By: Deon Pilling RN, BSN Signed: 11/28/2021 4:44:16 PM By: Kalman Shan DO Entered By: Deon Pilling on 11/28/2021 13:50:18

## 2021-11-28 NOTE — Progress Notes (Signed)
[name] Bell ([id number]) [id number] Page 1 of 6 Visit Report for 11/28/2021 Arrival Information Details Patient Name: Date of Service: [name]. 11/28/2021 1:15 PM Medical Record Number: [id number] Patient Account Number: [id number] Date of Birth/Sex: Treating RN: [birth year] (88 y.o. [name], [name] Primary Care [name]: [name] Other Clinician: Referring [name]: Treating [name]: [name]: 21 Visit Information History Since Last Visit Added or deleted any medications: No Patient Arrived: Walker Any new allergies or adverse reactions: No Arrival Time: 13:15 Had Bell fall or experienced change in No Accompanied By: self activities of daily living that may affect Transfer Assistance: None risk of falls: Patient Identification Verified: Yes Signs or symptoms of abuse/neglect since last visito No Secondary Verification Process Completed: Yes Hospitalized since last visit: No Patient Requires Transmission-Based No Implantable device outside of the clinic excluding No Precautions: cellular tissue based products placed in the center Patient Has Alerts: Yes since last visit: Patient Alerts: R ABI = Non compressible Has Dressing in Place as Prescribed: Yes L ABI = Non Has Compression in Place as Prescribed: Yes compressible Pain Present Now: Yes Notes drainage and odor noted from BLE wraps before removing. [name]. Electronic Signature(s) Signed: 11/28/2021 3:27:23 PM By: [name] Entered By: [name] on 11/28/2021 13:45:23 -------------------------------------------------------------------------------- Compression Therapy Details Patient Name: Date of Service: [name] ETT, [name] 11/28/2021 1:15 PM Medical Record Number: [id number] Patient Account Number: [id number] Date of Birth/Sex: Treating RN: [birth year] (88 y.o. [name] Primary Care  [name]: [name] Other Clinician: Referring [name]: Treating [name]/Extender: [name]: 41 Compression Therapy Performed for Wound Assessment: Wound #20 Bell,Circumferential Lower Leg Performed By: Clinician [name] Pilling, RN Compression Type: Three [name]) Signed: 11/28/2021 3:27:23 PM By: [name] Entered By: [name] on 11/28/2021 13:47:22 [name] ([id number]) [id number] Page 2 of 6 -------------------------------------------------------------------------------- Compression Therapy Details Patient Name: Date of Service: [name] ETT, [name] 11/28/2021 1:15 PM Medical Record Number: [id number] Patient Account Number: [id number] Date of Birth/Sex: Treating RN: 1925/07/04 (88 y.o. [name] Primary Care Baraa Tubbs: [name] Other Clinician: Referring [name]: Treating [name]/Extender: [name]: 22 Compression Therapy Performed for Wound Assessment: Wound #7RR Left,Circumferential Lower Leg Performed By: Clinician [name] Pilling, RN Compression Type: Three [name]) Signed: 11/28/2021 3:27:23 PM By: [name] Entered By: [name] on 11/28/2021 13:47:22 -------------------------------------------------------------------------------- Encounter Discharge Information Details Patient Name: Date of Service: [name] ETT, [name] 11/28/2021 1:15 PM Medical Record Number: [id number] Patient Account Number: [id number] Date of Birth/Sex: Treating RN: November 04, 1925 (88 y.o. [name] Primary Care Jeromie Bell: [name] Other Clinician: Referring [name]: Treating [name]/Extender: [name]: 41 Encounter Discharge Information Items Discharge Condition: Stable Ambulatory Status: Walker Discharge  Destination: Home Transportation: Private Auto Accompanied By: self Schedule Follow-up Appointment: Yes Clinical Summary of Care: Electronic Signature(s) Signed: 11/28/2021 3:27:23 PM By: [name] Entered By: [name] on 11/28/2021 13:50:11 -------------------------------------------------------------------------------- Wound Assessment Details Patient Name: Date of Service: [name] ETT, [name] 11/28/2021 1:15 PM Medical Record Number: [id number] Patient Account Number: [id number] Date of Birth/Sex: Treating RN: 1925-02-11 (88 y.o. [name] Primary Care [name]: [name] Other Clinician: Referring [name]: Treating [name]: [name]: 60 Wound Status Wound Number: 20 Primary Lymphedema Etiology: Wound Location: Bell, Circumferential  Lower Leg Wound Open Wounding Event: Gradually Appeared Status: Date Acquired: 06/25/2021 Comorbid Anemia, Arrhythmia, Congestive Heart Failure, Hypertension, Weeks Of Bell: 22 History: Colitis, Osteoarthritis Clustered Wound: Yes Wound Measurements Length: (cm) 16 [name] ([id number]) Width: (cm) 26 Depth: (cm) 0.1 Area: (cm) 326.7 Volume: (cm) 32.67 % Reduction in Area: 17.8% [id number] Page 3 of 6 % Reduction in Volume: 17.8% Epithelialization: None 26 Tunneling: No 3 Undermining: No Wound Description Classification: Full Thickness Without Exposed Support Structures Wound Margin: Distinct, outline attached Exudate Amount: Large Exudate Type: Serosanguineous Exudate Color: red, brown Foul Odor After Cleansing: Yes Due to Product Use: No Slough/Fibrino Yes Wound Bed Granulation Amount: Large (67-100%) Exposed Structure Granulation Quality: Pink Fascia Exposed: No Necrotic Amount: Small (1-33%) Fat Layer (Subcutaneous Tissue) Exposed: Yes Necrotic Quality: Adherent Slough Tendon Exposed: No Muscle  Exposed: No Joint Exposed: No Bone Exposed: No Periwound Skin Texture Texture Color No Abnormalities Noted: No No Abnormalities Noted: No Callus: No Atrophie Blanche: No Crepitus: No Cyanosis: No Excoriation: No Ecchymosis: No Induration: No Erythema: No Rash: No Hemosiderin Staining: Yes Scarring: No Mottled: No Pallor: No Moisture Rubor: No No Abnormalities Noted: No Dry / Scaly: No Temperature / Pain Maceration: Yes Temperature: No Abnormality Tenderness on Palpation: Yes Bell Notes Wound #20 (Lower Leg) Wound Laterality: Bell, Circumferential Cleanser Soap and Water Discharge Instruction: May shower and wash wound with dial antibacterial soap and water prior to dressing change. Wound Cleanser Discharge Instruction: Cleanse the wound with wound cleanser prior to applying Bell clean dressing using gauze sponges, not tissue or cotton balls. Peri-Wound Care Zinc Oxide Ointment 30g tube Discharge Instruction: Apply Zinc Oxide to periwound with each dressing change cetaphil lotion Discharge Instruction: patient to provide to nurses to apply to both legs. Topical Gentamicin Discharge Instruction: As directed by physician Mupirocin Ointment Discharge Instruction: Apply Mupirocin (Bactroban) as instructed Ketoconazole Cream 2% Discharge Instruction: Apply Ketoconazole as directed Primary Dressing KerraCel Ag Gelling Fiber Dressing, 4x5 in (silver alginate) Discharge Instruction: Apply silver alginate to wound bed as instructed Secondary Dressing ABD Pad, 8x10 Discharge Instruction: Apply over primary dressing as directed. CarboFLEX Odor Control Dressing, 4x4 in Discharge Instruction: Apply over primary dressing as directed. Zetuvit Plus 4x8 in [name], [name] ([id number]) [id number] Page 4 of 6 Discharge Instruction: Apply over primary dressing as directed. Secured With Compression Wrap ThreePress (3 layer compression wrap) Discharge  Instruction: Apply three layer compression as directed. ***PLEASE USE KERLIX INSTEAD OF COTTON.*** Compression Stockings Add-Ons Notes [name] for mupiriocin, gentamicin, and ketoconazole mixed and apply directly to wounds. Electronic Signature(s) Signed: 11/28/2021 3:27:23 PM By: [name] Entered By: [name] on 11/28/2021 13:46:14 -------------------------------------------------------------------------------- Wound Assessment Details Patient Name: Date of Service: [name] ETT, [place]. 11/28/2021 1:15 PM Medical Record Number: [id number] Patient Account Number: [id number] Date of Birth/Sex: Treating RN: Jun 04, 1925 (88 y.o. [name], [name] Primary Care Lucille Witts: [name] Other Clinician: Referring Keywon Mestre: Treating Charlisa Cham/Extender: [name]: 9 Wound Status Wound Number: 7RR Primary Malignant Wound Etiology: Wound Location: Left, Circumferential Lower Leg Wound Open Wounding Event: Blister Status: Date Acquired: 04/20/2020 Comorbid Anemia, Arrhythmia, Congestive Heart Failure, Hypertension, Weeks Of Bell: 76 History: Colitis, Osteoarthritis Clustered Wound: Yes Wound Measurements Length: (cm) 16 Width: (cm) 23 Depth: (cm) 0.1 Area: (cm) 289.027 Volume: (cm) 28.903 % Reduction in Area: -9311.5% % Reduction in Volume: -9314.7% Epithelialization: None Tunneling: No Undermining: No Wound Description Classification: Full Thickness Without Exposed Suppo Wound Margin: Distinct, outline attached Exudate Amount:  Large Exudate Type: Serosanguineous Exudate Color: red, brown rt Structures Foul Odor After Cleansing: Yes Due to Product Use: No Slough/Fibrino Yes Wound Bed Granulation Amount: Small (1-33%) Exposed Structure Granulation Quality: Red Fascia Exposed: No Necrotic Amount: Large (67-100%) Fat Layer (Subcutaneous Tissue) Exposed: Yes Necrotic Quality: Adherent Slough Tendon  Exposed: No Muscle Exposed: No Joint Exposed: No Bone Exposed: No Periwound Skin Texture Texture Color No Abnormalities Noted: No No Abnormalities Noted: No Callus: No Atrophie Blanche: No Crepitus: No Cyanosis: No Excoriation: No Ecchymosis: No Induration: No Erythema: No Rash: No Hemosiderin Staining: No Lesser, Jalene Bell ([id number]) [id number] Page 5 of 6 Scarring: No Mottled: No Pallor: No Moisture Rubor: No No Abnormalities Noted: No Dry / Scaly: No Temperature / Pain Maceration: Yes Temperature: No Abnormality Bell Notes Wound #7RR (Lower Leg) Wound Laterality: Left, Circumferential Cleanser Soap and Water Discharge Instruction: May shower and wash wound with dial antibacterial soap and water prior to dressing change. Wound Cleanser Discharge Instruction: Cleanse the wound with wound cleanser prior to applying Bell clean dressing using gauze sponges, not tissue or cotton balls. Peri-Wound Care Zinc Oxide Ointment 30g tube Discharge Instruction: Apply Zinc Oxide to periwound with each dressing change cetaphil lotion Discharge Instruction: patient to provide to nurses to apply to both legs. Topical Gentamicin Discharge Instruction: As directed by physician Mupirocin Ointment Discharge Instruction: Apply Mupirocin (Bactroban) as instructed Ketoconazole Cream 2% Discharge Instruction: Apply Ketoconazole as directed Primary Dressing KerraCel Ag Gelling Fiber Dressing, 4x5 in (silver alginate) Discharge Instruction: Apply silver alginate to wound bed as instructed Secondary Dressing ABD Pad, 8x10 Discharge Instruction: Apply over primary dressing as directed. CarboFLEX Odor Control Dressing, 4x4 in Discharge Instruction: Apply over primary dressing as directed. Zetuvit Plus 4x8 in Discharge Instruction: Apply over primary dressing as directed. Secured With Compression Wrap ThreePress (3 layer compression wrap) Discharge  Instruction: Apply three layer compression as directed. ***PLEASE USE KERLIX INSTEAD OF COTTON.*** Compression Stockings Add-Ons Notes Yamilett Anastos's Bell for mupiriocin, gentamicin, and ketoconazole mixed and apply directly to wounds. Electronic Signature(s) Signed: 11/28/2021 3:27:23 PM By: [name] Entered By: [name] on 11/28/2021 13:47:03 -------------------------------------------------------------------------------- Vitals Details Patient Name: Date of Service: [name] ETT, [place]. 11/28/2021 1:15 PM Medical Record Number: [id number] Patient Account Number: [id number] Date of Birth/Sex: Treating RN: 20-May-1925 (88 y.o. [address] Bell ([id number]) [id number] Page 6 of 6 Primary Care Julissa Browning: [name] Other Clinician: Referring Desa Rech: Treating Kaaren Nass/Extender: [name]: 72 Vital Signs Time Taken: 13:15 Temperature (F): 98.1 Height (in): 65 Pulse (bpm): 71 Weight (lbs): 163 Respiratory Rate (breaths/min): 20 Body Mass Index (BMI): 27.1 Blood Pressure (mmHg): 112/73 Reference Range: 80 - 120 mg / dl Electronic Signature(s) Signed: 11/28/2021 3:27:23 PM By: [name] Entered By: [name] on 11/28/2021 13:45:36

## 2021-12-02 ENCOUNTER — Ambulatory Visit (INDEPENDENT_AMBULATORY_CARE_PROVIDER_SITE_OTHER): Payer: Medicare Other

## 2021-12-02 VITALS — Ht 65.0 in | Wt 151.0 lb

## 2021-12-02 DIAGNOSIS — Z0000 Encounter for general adult medical examination without abnormal findings: Secondary | ICD-10-CM

## 2021-12-02 NOTE — Patient Instructions (Addendum)
[name] , Thank you for taking time to come for your Medicare Wellness Visit. I appreciate your ongoing commitment to your health goals. Please review the following plan we discussed and let me know if I can assist you in the future.   These are the goals we discussed:  Goals      Client understands the importance of follow-up with providers by attending scheduled visits        This is a list of the screening recommended for you and due dates:  Health Maintenance  Topic Date Due   Zoster (Shingles) Vaccine (1 of 2) Never done   Flu Shot  08/20/2021   COVID-19 Vaccine (8 - Pfizer risk series) 12/20/2021   Medicare Annual Wellness Visit  12/03/2022   Tetanus Vaccine  06/17/2023   Pneumonia Vaccine  Completed   DEXA scan (bone density measurement)  Completed   HPV Vaccine  Aged Out    Advanced directives: Yes  Conditions/risks identified: Yes   Next appointment: Follow up in one year for your annual wellness visit on 12/04/2022 at 9:45 a.m. telephone visit with Mignon Pine, Nurse Health Advisor.  If you need to reschedule or cancel, please call [phone].   Preventive Care 69 Years and Older, Female Preventive care refers to lifestyle choices and visits with your health care provider that can promote health and wellness. What does preventive care include? A yearly physical exam. This is also called an annual well check. Dental exams once or twice a year. Routine eye exams. Ask your health care provider how often you should have your eyes checked. Personal lifestyle choices, including: Daily care of your teeth and gums. Regular physical activity. Eating a healthy diet. Avoiding tobacco and drug use. Limiting alcohol use. Practicing safe sex. Taking low-dose aspirin every day. Taking vitamin and mineral supplements as recommended by your health care provider. What happens during an annual well check? The services and screenings done by your health care provider during your  annual well check will depend on your age, overall health, lifestyle risk factors, and family history of disease. Counseling  Your health care provider may ask you questions about your: Alcohol use. Tobacco use. Drug use. Emotional well-being. Home and relationship well-being. Sexual activity. Eating habits. History of falls. Memory and ability to understand (cognition). Work and work Statistician. Reproductive health. Screening  You may have the following tests or measurements: Height, weight, and BMI. Blood pressure. Lipid and cholesterol levels. These may be checked every 5 years, or more frequently if you are over 33 years old. Skin check. Lung cancer screening. You may have this screening every year starting at age 64 if you have a 30-pack-year history of smoking and currently smoke or have quit within the past 15 years. Fecal occult blood test (FOBT) of the stool. You may have this test every year starting at age 73. Flexible sigmoidoscopy or colonoscopy. You may have a sigmoidoscopy every 5 years or a colonoscopy every 10 years starting at age 28. Hepatitis C blood test. Hepatitis B blood test. Sexually transmitted disease (STD) testing. Diabetes screening. This is done by checking your blood sugar (glucose) after you have not eaten for a while (fasting). You may have this done every 1-3 years. Bone density scan. This is done to screen for osteoporosis. You may have this done starting at age 30. Mammogram. This may be done every 1-2 years. Talk to your health care provider about how often you should have regular mammograms. Talk with your health  care provider about your test results, treatment options, and if necessary, the need for more tests. Vaccines  Your health care provider may recommend certain vaccines, such as: Influenza vaccine. This is recommended every year. Tetanus, diphtheria, and acellular pertussis (Tdap, Td) vaccine. You may need a Td booster every 10  years. Zoster vaccine. You may need this after age 33. Pneumococcal 13-valent conjugate (PCV13) vaccine. One dose is recommended after age 61. Pneumococcal polysaccharide (PPSV23) vaccine. One dose is recommended after age 70. Talk to your health care provider about which screenings and vaccines you need and how often you need them. This information is not intended to replace advice given to you by your health care provider. Make sure you discuss any questions you have with your health care provider. Document Released: 02/02/2015 Document Revised: 09/26/2015 Document Reviewed: 11/07/2014 Elsevier Interactive Patient Education  2017 [employer] Prevention in the Home Falls can cause injuries. They can happen to people of all ages. There are many things you can do to make your home safe and to help prevent falls. What can I do on the outside of my home? Regularly fix the edges of walkways and driveways and fix any cracks. Remove anything that might make you trip as you walk through a door, such as a raised step or threshold. Trim any bushes or trees on the path to your home. Use bright outdoor lighting. Clear any walking paths of anything that might make someone trip, such as rocks or tools. Regularly check to see if handrails are loose or broken. Make sure that both sides of any steps have handrails. Any raised decks and porches should have guardrails on the edges. Have any leaves, snow, or ice cleared regularly. Use sand or salt on walking paths during winter. Clean up any spills in your garage right away. This includes oil or grease spills. What can I do in the bathroom? Use night lights. Install grab bars by the toilet and in the tub and shower. Do not use towel bars as grab bars. Use non-skid mats or decals in the tub or shower. If you need to sit down in the shower, use a plastic, non-slip stool. Keep the floor dry. Clean up any water that spills on the floor as soon as it  happens. Remove soap buildup in the tub or shower regularly. Attach bath mats securely with double-sided non-slip rug tape. Do not have throw rugs and other things on the floor that can make you trip. What can I do in the bedroom? Use night lights. Make sure that you have a light by your bed that is easy to reach. Do not use any sheets or blankets that are too big for your bed. They should not hang down onto the floor. Have a firm chair that has side arms. You can use this for support while you get dressed. Do not have throw rugs and other things on the floor that can make you trip. What can I do in the kitchen? Clean up any spills right away. Avoid walking on wet floors. Keep items that you use a lot in easy-to-reach places. If you need to reach something above you, use a strong step stool that has a grab bar. Keep electrical cords out of the way. Do not use floor polish or wax that makes floors slippery. If you must use wax, use non-skid floor wax. Do not have throw rugs and other things on the floor that can make you trip. What can  I do with my stairs? Do not leave any items on the stairs. Make sure that there are handrails on both sides of the stairs and use them. Fix handrails that are broken or loose. Make sure that handrails are as long as the stairways. Check any carpeting to make sure that it is firmly attached to the stairs. Fix any carpet that is loose or worn. Avoid having throw rugs at the top or bottom of the stairs. If you do have throw rugs, attach them to the floor with carpet tape. Make sure that you have a light switch at the top of the stairs and the bottom of the stairs. If you do not have them, ask someone to add them for you. What else can I do to help prevent falls? Wear shoes that: Do not have high heels. Have rubber bottoms. Are comfortable and fit you well. Are closed at the toe. Do not wear sandals. If you use a stepladder: Make sure that it is fully opened.  Do not climb a closed stepladder. Make sure that both sides of the stepladder are locked into place. Ask someone to hold it for you, if possible. Clearly mark and make sure that you can see: Any grab bars or handrails. First and last steps. Where the edge of each step is. Use tools that help you move around (mobility aids) if they are needed. These include: Canes. Walkers. Scooters. Crutches. Turn on the lights when you go into a dark area. Replace any light bulbs as soon as they burn out. Set up your furniture so you have a clear path. Avoid moving your furniture around. If any of your floors are uneven, fix them. If there are any pets around you, be aware of where they are. Review your medicines with your doctor. Some medicines can make you feel dizzy. This can increase your chance of falling. Ask your doctor what other things that you can do to help prevent falls. This information is not intended to replace advice given to you by your health care provider. Make sure you discuss any questions you have with your health care provider. Document Released: 11/02/2008 Document Revised: 06/14/2015 Document Reviewed: 02/10/2014 Elsevier Interactive Patient Education  2017 [employer].

## 2021-12-02 NOTE — Progress Notes (Addendum)
Virtual Visit via Telephone Note  I connected with  [name] on 12/02/21 at  9:45 AM EST by telephone and verified that I am speaking with the correct person using two identifiers.  Location: Patient: Home Provider: [facility] [place] participating in the virtual visit: [place]   I discussed the limitations, risks, security and privacy concerns of performing an evaluation and management service by telephone and the availability of in person appointments. The patient expressed understanding and agreed to proceed.  Interactive audio and video telecommunications were attempted between this nurse and patient, however failed, due to patient having technical difficulties OR patient did not have access to video capability.  We continued and completed visit with audio only.  Some vital signs may be absent or patient reported.   Sheral Flow, LPN  Subjective:   [name] is a 88 y.o. female who presents for Medicare Annual (Subsequent) preventive examination.  Review of Systems     Cardiac Risk Factors include: advanced age (>87mn, >>50women);hypertension     Objective:    Today's Vitals   12/02/21 0957  Weight: 151 lb (68.5 kg)  Height: '5\' 5"'$  (1.651 m)  PainSc: 0-No pain   Body mass index is 25.13 kg/m.     12/02/2021    9:52 AM 08/22/2021   12:45 PM 06/19/2021    2:00 PM 03/26/2021    2:59 PM 07/04/2020    2:40 PM 06/06/2020    9:33 AM 10/03/2019    9:32 AM  Advanced Directives  Does Patient Have a Medical Advance Directive? Yes Yes Yes Yes Yes Yes Unable to assess, patient is non-responsive or altered mental status  Type of AScientist, forensicPower of AFindlayLiving will    Healthcare Power of [place]  Does patient want to make changes to medical advance directive?  No - Patient declined No - Patient declined No - Patient declined No - Patient declined No - Patient declined    Copy of HRockledgein Chart? No - copy requested    No - copy requested Yes - validated most recent copy scanned in chart (See row information)     Current Medications (verified) Outpatient Encounter Medications as of 12/02/2021  Medication Sig   acetaminophen (TYLENOL) 500 MG tablet Take 500 mg by mouth daily as needed for mild pain.    apixaban (ELIQUIS) 5 MG TABS tablet Take 1 tablet (5 mg total) by mouth 2 (two) times daily.   B Complex-C (B-COMPLEX WITH VITAMIN C) tablet Take 1 tablet by mouth daily.   busPIRone (BUSPAR) 15 MG tablet TAKE 1 TABLET BY MOUTH THREE TIMES A DAY   CEMIPLIMAB-RWLC IV Inject 1 Dose into the vein every 21 ( twenty-one) days.   Cholecalciferol (VITAMIN D3) 1000 UNITS tablet Take 1,000 Units by mouth daily.   colestipol (COLESTID) 1 g tablet Take 1 tablet (1 g total) by mouth in the morning. (Patient not taking: Reported on 09/17/2021)   diltiazem (CARDIZEM CD) 120 MG 24 hr capsule TAKE 1 CAPSULE BY MOUTH ONCE DAILY   doxycycline (VIBRA-TABS) 100 MG tablet TAKE 1 TABLET BY MOUTH ONCE DAILY   famotidine (PEPCID) 20 MG tablet Take 20 mg by mouth daily.   furosemide (LASIX) 40 MG tablet TAKE 1 TABLET BY MOUTH TWICE DAILY   gabapentin (NEURONTIN) 100 MG capsule TAKE 1  CAPSULE BY MOUTH EVERY NIGHT AT BEDTIME *DO NOT CRUSH OR CHEW*  hydrocortisone 2.5 % lotion Apply topically 3 (three) times daily.   levothyroxine (SYNTHROID) 25 MCG tablet Take 1 tablet (25 mcg total) by mouth daily before breakfast.   lidocaine-prilocaine (EMLA) cream Apply 1 Application topically as needed. As directed 1 h before procedure   metoprolol succinate (TOPROL-XL) 50 MG 24 hr tablet TAKE 1/2 TABLET = 25 MG BY MOUTH TWICE DAILY. TAKE WITH OR IMMEDIATELY FOLLOWING A MEAL   Multiple Vitamins-Minerals (MULTIVITAMIN WITH MINERALS) tablet Take 1 tablet by mouth daily.   polyethylene glycol (MIRALAX) 17 g packet Take 17 g by mouth daily.   potassium chloride SA (KLOR-CON M) 20  MEQ tablet TAKE 1 TABLET BY MOUTH ONCE DAILY  ALONG W/ FUROSEMIDE AS NEED *TAKE WITH FOOD* *DO NOT CRUSH OR CHEW* *MAY DISSOLVE*   temazepam (RESTORIL) 15 MG capsule TAKE 1 CAPSULE BY MOUTH AT BEDTIME AS NEEDED FOR SLEEP   No facility-administered encounter medications on file as of 12/02/2021.    Allergies (verified) Zosyn [piperacillin sod-tazobactam so], Atorvastatin, Zoloft [sertraline], Atenolol, Clarithromycin, Codeine sulfate, Levaquin [levofloxacin], Macrodantin, and Oxycodone-acetaminophen   History: Past Medical History:  Diagnosis Date   Anxiety    denies   Atrial flutter (HCC)    Bilateral edema of lower extremity    Breast cancer (HCC)    left breast   Chronic constipation    Chronic diastolic CHF (congestive heart failure) (HCC)    Diverticulosis of colon    GERD (gastroesophageal reflux disease)    H/O hiatal hernia    History of cellulitis    LEFT LOWER LEG --  SEPT 2015   History of colitis    DX  2003  WITH ISCHEMIC COLITIS   History of diverticulitis of colon    perferated diverticulitis w/ abscess 08-25-2013 drain placed --  resolved without surgical intervention   History of GI bleed    2011--- UPPER SECONARY GASTRIC ULCER FROM NSAID USE   History of Helicobacter pylori infection    2011   History of ulcer of lower limb    2012   RIGHT LOWER EXTREMITIY--  STATSIS ULCER   Hyperlipidemia    Hypertension    Hypothyroidism    Iron deficiency anemia 2011   elevated MCV   LBP (low back pain)    severe lumbar spondylosis s/p L3/L4,L4/L5 fusion 12/10   Lumbar spondylolysis    OA (osteoarthritis)    "hands" (08/25/2013)   Open wound of left lower leg    Osteopenia    Persistent atrial fibrillation ([place])    CARDIOLOGIST-   DR MCALANY   RBBB    Urinary frequency    Past Surgical History:  Procedure Laterality Date   APPLICATION OF A-CELL OF EXTREMITY Left 06/21/2013   Procedure: APPLICATION OF A-CELL OF EXTREMITY;  Surgeon: Theodoro Kos, DO;  Location: [facility];  Service: Plastics;  Laterality: Left;   APPLICATION OF A-CELL OF EXTREMITY Left 08/22/2013   Procedure: APPLICATION OF A-CELL/VAC TO LOWER LEFT LEG WOUND;  Surgeon: Theodoro Kos, DO;  Location: [place];  Service: Plastics;  Laterality: Left;   APPLICATION OF A-CELL OF EXTREMITY Left 02/06/2014   Procedure: WITH PLACEMENT OF A -CELL ;  Surgeon: Theodoro Kos, DO;  Location: [place];  Service: Plastics;  Laterality: Left;   APPLICATION OF A-CELL OF EXTREMITY Left 08/09/2014   Procedure: PLACEMENT OF A CELL;  Surgeon: Theodoro Kos, DO;  Location: [place];  Service: Plastics;  Laterality: Left;   APPLICATION OF WOUND VAC Left 06/16/2013  Procedure: APPLICATION OF WOUND VAC;  Surgeon: Meredith Pel, MD;  Location: [place];  Service: Orthopedics;  Laterality: Left;   APPLICATION OF WOUND VAC Left 06/21/2013   Procedure: APPLICATION OF WOUND VAC;  Surgeon: Theodoro Kos, DO;  Location: [place];  Service: Plastics;  Laterality: Left;   BIOPSY BREAST  07/25/2015   LEFT RADIOACTIVE SEED GUIDED EXCISIONAL BREAST BIOPSY (Left) as a surgical intervention   EXTERNAL FIXATION LEG Left 06/16/2013   Procedure: EXTERNAL FIXATION LEG;  Surgeon: Meredith Pel, MD;  Location: [place];  Service: Orthopedics;  Laterality: Left;   EXTERNAL FIXATION REMOVAL Left 06/21/2013   Procedure: REMOVAL EXTERNAL FIXATION LEG;  Surgeon: Meredith Pel, MD;  Location: [place];  Service: Orthopedics;  Laterality: Left;   EYE SURGERY Bilateral    cataract removal   HARDWARE REMOVAL Left 05/23/2014   Procedure: HARDWARE REMOVAL;  Surgeon: Meredith Pel, MD;  Location: [place];  Service: Orthopedics;  Laterality: Left;  REMOVAL OF HARDWARE LEFT TIBIA   I & D EXTREMITY Left 06/16/2013   Procedure: IRRIGATION AND DEBRIDEMENT EXTREMITY;  Surgeon: Meredith Pel, MD;  Location: [place];  Service: Orthopedics;  Laterality: Left;   I & D EXTREMITY Left 02/06/2014   Procedure: IRRIGATION AND  DEBRIDEMENT LEFT LEG WOUND ;  Surgeon: Theodoro Kos, DO;  Location: [place];  Service: Plastics;  Laterality: Left;   I & D EXTREMITY Left 08/09/2014   Procedure: IRRIGATION AND DEBRIDEMENT  OF LEFT LOWER LEG;  Surgeon: Theodoro Kos, DO;  Location: [place];  Service: Plastics;  Laterality: Left;   LUMBAR FUSION  01-02-2009   L3-L5   RADIOACTIVE SEED GUIDED EXCISIONAL BREAST BIOPSY Left 07/25/2015   Procedure: LEFT RADIOACTIVE SEED GUIDED EXCISIONAL BREAST BIOPSY;  Surgeon: Rolm Bookbinder, MD;  Location: [place];  Service: General;  Laterality: Left;   RE-EXCISION OF BREAST LUMPECTOMY Left 09/05/2015   Procedure: RE-EXCISION OF LEFT BREAST LUMPECTOMY;  Surgeon: Rolm Bookbinder, MD;  Location: [place];  Service: General;  Laterality: Left;   SHOULDER OPEN ROTATOR CUFF REPAIR Left 1997   TIBIA IM NAIL INSERTION Left 06/21/2013   Procedure: INTRAMEDULLARY (IM) NAIL TIBIAL;  Surgeon: Meredith Pel, MD;  Location: [place];  Service: Orthopedics;  Laterality: Left;   TONSILLECTOMY  AS CHILD   TOTAL HIP ARTHROPLASTY Right 06-07-2010   TRANSTHORACIC ECHOCARDIOGRAM  09-11-2010   DR MCALHANY   LVSF  55-60%/  MILD MV CALCIFICATION WITH NO STENOSIS/  MILD MR & TR / MODERATE LAE/  MODERATE TO SEVERE RAE   UPPER GI ENDOSCOPY  03-29-2010   Family History  Problem Relation Age of Onset   Cancer Father    Hypertension Other    Social History   Socioeconomic History   Marital status: Married    Spouse name: Not on file   Number of children: 2   Years of education: Not on file   Highest education level: Not on file  Occupational History   Occupation: retired    Fish farm manager: UNEMPLOYED  Tobacco Use   Smoking status: Never   Smokeless tobacco: Never  Vaping Use   Vaping Use: Never used  Substance and Sexual Activity   Alcohol use: No    Comment: 1 glass occ wine 08/03/14- none in a year   Drug use: No   Sexual activity: Not on file  Other Topics Concern   Not on file  Social  History Narrative   Right handed   Lives with husband  Doesn't use the steps   Social Determinants of Health   Financial Resource Strain: Low Risk  (12/02/2021)   Overall Financial Resource Strain (CARDIA)    Difficulty of Paying Living Expenses: Not hard at all  Food Insecurity: No Food Insecurity (12/02/2021)   Hunger Vital Sign    Worried About Running Out of Food in the Last Year: Never true    Ran Out of Food in the Last Year: Never true  Transportation Needs: No Transportation Needs (12/02/2021)   PRAPARE - Hydrologist (Medical): No    Lack of Transportation (Non-Medical): No  Physical Activity: Inactive (12/02/2021)   Exercise Vital Sign    Days of Exercise per Week: 0 days    Minutes of Exercise per Session: 0 min  Stress: No Stress Concern Present (12/02/2021)   [place]    Feeling of Stress : Not at all  Social Connections: Unknown (12/02/2021)   Social Connection and Isolation Panel [NHANES]    Frequency of Communication with Friends and Family: Patient refused    Frequency of Social Gatherings with Friends and Family: Patient refused    Attends Religious Services: Patient refused    Marine scientist or Organizations: Patient refused    Attends Music therapist: Patient refused    Marital Status: Married    Tobacco Counseling Counseling given: Not Answered   Clinical Intake:  Pre-visit preparation completed: Yes  Pain : No/denies pain Pain Score: 0-No pain     BMI - recorded: 25.13 Nutritional Status: BMI 25 -29 Overweight Nutritional Risks: None Diabetes: No  How often do you need to have someone help you when you read instructions, pamphlets, or other written materials from your doctor or pharmacy?: 1 - Never What is the last grade level you completed in school?: HSG  Diabetic? no  Interpreter Needed?: No  Information entered by  :: Lisette Abu, LPN.   Activities of Daily Living    12/02/2021   10:01 AM  In your present state of health, do you have any difficulty performing the following activities:  Hearing? 0  Vision? 0  Difficulty concentrating or making decisions? 1  Walking or climbing stairs? 1  Dressing or bathing? 1  Doing errands, shopping? 1  Preparing Food and eating ? Y  Using the Toilet? Y  In the past six months, have you accidently leaked urine? Y  Do you have problems with loss of bowel control? Y  Managing your Medications? Y  Managing your Finances? Y  Housekeeping or managing your Housekeeping? Y    Patient Care Team: [name], [name], MD as PCP - General Angelena Form Annita Brod, MD as PCP - Cardiology (Cardiology) Rolm Bookbinder, MD as Consulting Physician (General Surgery) Magrinat, Virgie Dad, MD (Inactive) as Consulting Physician (Oncology) Burnell Blanks, MD as Consulting Physician (Cardiology) Jarome Matin, MD as Consulting Physician (Dermatology) Marlou Sa Tonna Corner, MD as Consulting Physician (Orthopedic Surgery) Cameron Sprang, MD as Consulting Physician (Neurology) Eppie Gibson, MD as Attending Physician (Radiation Oncology) Valinda Party, DO as Consulting Physician (Internal Medicine)  Indicate any recent Medical Services you may have received from other than [facility] providers in the past year (date may be approximate).     Assessment:   This is a routine wellness examination for [name].  Hearing/Vision screen Hearing Screening - Comments:: Denies hearing difficulties.  Vision Screening - Comments:: Does not wear glasses; no eye exam.  Dietary issues and exercise activities discussed: Current Exercise Habits: The patient does not participate in regular exercise at present, Exercise limited by: cardiac condition(s);orthopedic condition(s)   Goals Addressed             This Visit's Progress    Client understands the importance of  follow-up with providers by attending scheduled visits        Depression Screen    12/02/2021    9:54 AM 09/17/2021   11:45 AM 06/25/2020    1:33 PM 09/15/2019    3:19 PM 07/14/2018    1:40 PM 10/22/2016    3:26 PM 08/22/2016   10:59 AM  PHQ 2/9 Scores  PHQ - 2 Score 4 4 0 0 0 2 0  PHQ- 9 Score '10 14    3     '$ Fall Risk    12/02/2021    9:53 AM 09/17/2021   11:44 AM 06/25/2020    1:33 PM 03/28/2019    1:59 PM 07/14/2018    1:40 PM  Fall Risk   Falls in the past year? 0 1 0 0 0  Number falls in past yr: 0 1 0 0   Injury with Fall? 0 0 0 0   Risk for fall due to : No Fall Risks History of fall(s);Impaired balance/gait;Impaired mobility     Follow up Falls prevention discussed        FALL RISK PREVENTION PERTAINING TO THE HOME:  Any stairs in or around the home? No  If so, are there any without handrails? No  Home free of loose throw rugs in walkways, pet beds, electrical cords, etc? Yes  Adequate lighting in your home to reduce risk of falls? Yes   ASSISTIVE DEVICES UTILIZED TO PREVENT FALLS:  Life alert? Yes  Use of a cane, walker or w/c? Yes  Grab bars in the bathroom? Yes  Shower chair or bench in shower? Yes  Elevated toilet seat or a handicapped toilet? Yes   TIMED UP AND GO:  Was the test performed? No . Phone Visit  Cognitive Function:    10/22/2016    3:27 PM  MMSE - Mini Mental State Exam  Orientation to time 5  Orientation to Place 5  Registration 3  Attention/ Calculation 4  Recall 1  Language- name 2 objects 2  Language- repeat 1  Language- follow 3 step command 3  Language- read & follow direction 1  Write a sentence 1  Copy design 1  Total score 27        12/02/2021   10:02 AM  6CIT Screen  What Year? 0 points  What month? 0 points  What time? 0 points  Count back from 20 0 points  Months in reverse 0 points  Repeat phrase 0 points  Total Score 0 points    Immunizations Immunization History  Administered Date(s) Administered   Fluad  Quad(high Dose 65+) 11/15/2018, 02/05/2021   H1N1 02/02/2008   Influenza Split 11/05/2010   Influenza Whole 11/26/2005, 10/23/2009, 10/21/2011   Influenza, High Dose Seasonal PF 10/21/2015, 10/14/2016, 11/12/2017   Influenza,inj,Quad PF,6+ Mos 10/16/2014, 09/28/2019   Influenza-Unspecified 11/15/2012, 10/20/2013, 10/22/2016   PFIZER Comirnaty(Gray Top)Covid-19 Tri-Sucrose Vaccine 10/25/2021   PFIZER(Purple Top)SARS-COV-2 Vaccination 02/09/2019, 03/02/2019, 11/14/2019   Pfizer Covid-19 Vaccine Bivalent Booster 73yr & up 05/03/2020, 10/04/2020, 06/13/2021   Pneumococcal Conjugate-13 02/23/2013   Pneumococcal Polysaccharide-23 11/26/2005   Td 07/21/2012   Tdap 06/16/2013    TDAP status: Up to date  Flu Vaccine status:  Due, Education has been provided regarding the importance of this vaccine. Advised may receive this vaccine at local pharmacy or Health Dept. Aware to provide a copy of the vaccination record if obtained from local pharmacy or Health Dept. Verbalized acceptance and understanding.  Pneumococcal vaccine status: Up to date  Covid-19 vaccine status: Completed vaccines  Qualifies for Shingles Vaccine? Yes   Zostavax completed No   Shingrix Completed?: No.    Education has been provided regarding the importance of this vaccine. Patient has been advised to call insurance company to determine out of pocket expense if they have not yet received this vaccine. Advised may also receive vaccine at local pharmacy or Health Dept. Verbalized acceptance and understanding.  Screening Tests Health Maintenance  Topic Date Due   Zoster Vaccines- Shingrix (1 of 2) Never done   INFLUENZA VACCINE  08/20/2021   COVID-19 Vaccine (8 - Pfizer risk series) 12/20/2021   Medicare Annual Wellness (AWV)  12/03/2022   TETANUS/TDAP  06/17/2023   Pneumonia Vaccine 23+ Years old  Completed   DEXA SCAN  Completed   HPV VACCINES  Aged Out    Health Maintenance  Health Maintenance Due  Topic Date Due    Zoster Vaccines- Shingrix (1 of 2) Never done   INFLUENZA VACCINE  08/20/2021    Colorectal cancer screening: No longer required.   Mammogram status: No longer required due to age.  Bone Density status: No longer required.  Lung Cancer Screening: (Low Dose CT Chest recommended if Age 46-80 years, 30 pack-year currently smoking OR have quit w/in 15years.) does not qualify.   Lung Cancer Screening Referral: no  Additional Screening:  Hepatitis C Screening: does not qualify; Completed no  Vision Screening: Recommended annual ophthalmology exams for early detection of glaucoma and other disorders of the eye. Is the patient up to date with their annual eye exam?  No  Who is the provider or what is the name of the office in which the patient attends annual eye exams? No If pt is not established with a provider, would they like to be referred to a provider to establish care? No .   Dental Screening: Recommended annual dental exams for proper oral hygiene  Community Resource Referral / Chronic Care Management: CRR required this visit?  No   CCM required this visit?  No      Plan:     I have personally reviewed and noted the following in the patient's chart:   Medical and social history Use of alcohol, tobacco or illicit drugs  Current medications and supplements including opioid prescriptions. Patient is not currently taking opioid prescriptions. Functional ability and status Nutritional status Physical activity Advanced directives List of other physicians Hospitalizations, surgeries, and ER visits in previous 12 months Vitals Screenings to include cognitive, depression, and falls Referrals and appointments  In addition, I have reviewed and discussed with patient certain preventive protocols, quality metrics, and best practice recommendations. A written personalized care plan for preventive services as well as general preventive health recommendations were provided to  patient.     Sheral Flow, LPN   [id number]   Nurse Notes: N/A  Medical screening examination/treatment/procedure(s) were performed by non-physician practitioner and as supervising physician I was immediately available for consultation/collaboration.  I agree with above. Lew Dawes, MD

## 2021-12-03 ENCOUNTER — Other Ambulatory Visit: Payer: Self-pay

## 2021-12-05 ENCOUNTER — Encounter (HOSPITAL_BASED_OUTPATIENT_CLINIC_OR_DEPARTMENT_OTHER): Payer: Medicare Other | Admitting: Internal Medicine

## 2021-12-05 DIAGNOSIS — L97822 Non-pressure chronic ulcer of other part of left lower leg with fat layer exposed: Secondary | ICD-10-CM

## 2021-12-05 DIAGNOSIS — I890 Lymphedema, not elsewhere classified: Secondary | ICD-10-CM | POA: Diagnosis not present

## 2021-12-05 DIAGNOSIS — Z85828 Personal history of other malignant neoplasm of skin: Secondary | ICD-10-CM | POA: Diagnosis not present

## 2021-12-05 DIAGNOSIS — C4492 Squamous cell carcinoma of skin, unspecified: Secondary | ICD-10-CM

## 2021-12-05 DIAGNOSIS — L97812 Non-pressure chronic ulcer of other part of right lower leg with fat layer exposed: Secondary | ICD-10-CM

## 2021-12-05 DIAGNOSIS — I87313 Chronic venous hypertension (idiopathic) with ulcer of bilateral lower extremity: Secondary | ICD-10-CM

## 2021-12-06 NOTE — Progress Notes (Signed)
LETONIA, STEAD [name] ([id number]) [id number] Page 1 of 11 Visit Report for 12/05/2021 Arrival Information Details Patient Name: Date of Service: [name], [name]. 12/05/2021 1:30 PM Medical Record Number: [id number] Patient Account Number: [id number] Date of Birth/Sex: Treating RN: 10-Apr-1925 (88 y.o. [name] Primary Care [name]: [name] Other Clinician: Referring [name]: Treating [name]/Extender: [name] in Treatment: 12 Visit Information History Since Last Visit Added or deleted any medications: No Patient Arrived: Walker Any new allergies or adverse reactions: No Arrival Time: 13:51 Had [name] fall or experienced change in No Accompanied By: self activities of daily living that may affect Transfer Assistance: None risk of falls: Patient Identification Verified: Yes Signs or symptoms of abuse/neglect since last visito No Secondary Verification Process Completed: Yes Hospitalized since last visit: No Patient Requires Transmission-Based No Implantable device outside of the clinic excluding No Precautions: cellular tissue based products placed in the center Patient Has Alerts: Yes since last visit: Patient Alerts: R ABI = Non compressible Has Dressing in Place as Prescribed: Yes L ABI = Non compressible Has Compression in Place as Prescribed: Yes Pain Present Now: No Electronic Signature(s) Signed: 12/05/2021 4:44:04 PM By: [name] Entered By: [name] on 12/05/2021 13:52:10 -------------------------------------------------------------------------------- Compression Therapy Details Patient Name: Date of Service: [name], [name]. 12/05/2021 1:30 PM Medical Record Number: [id number] Patient Account Number: [id number] Date of Birth/Sex: Treating RN: [birth year] (88 y.o. [name] Primary Care Brealynn Contino: [name] Other Clinician: Referring  [name]: Treating [name]/Extender: [name] in Treatment: 36 Compression Therapy Performed for Wound Assessment: Wound #20 [name],Circumferential Lower Leg Performed By: Clinician [name] Hammock, RN Compression Type: Three Layer Post Procedure Diagnosis Same as Pre-procedure Electronic Signature(s) Signed: 12/06/2021 12:09:11 PM By: [name] Hammock RN Entered By: [name] on 12/05/2021 14:48:14 [name] ([id number]) [id number] Page 2 of 11 -------------------------------------------------------------------------------- Compression Therapy Details Patient Name: Date of Service: [name], [name] 12/05/2021 1:30 PM Medical Record Number: [id number] Patient Account Number: [id number] Date of Birth/Sex: Treating RN: May 07, 1925 (88 y.o. [name] Primary Care Tachina Spoonemore: [name] Other Clinician: Referring Adelayde Minney: Treating [name]/Extender: [name] in Treatment: 1 Compression Therapy Performed for Wound Assessment: Wound #7RR Left,Circumferential Lower Leg Performed By: Clinician [name] Hammock, RN Compression Type: Three Layer Post Procedure Diagnosis Same as Pre-procedure Electronic Signature(s) Signed: 12/06/2021 12:09:11 PM By: [name] Hammock RN Entered By: [name] on 12/05/2021 14:48:14 -------------------------------------------------------------------------------- Encounter Discharge Information Details Patient Name: Date of Service: [name], [name] 12/05/2021 1:30 PM Medical Record Number: [id number] Patient Account Number: [id number] Date of Birth/Sex: Treating RN: October 13, 1925 (88 y.o. [name], [name] Primary Care [name]: [name] Other Clinician: Referring Malicia Blasdel: Treating [name]: [name] in Treatment: 22 Encounter Discharge Information  Items Discharge Condition: Stable Ambulatory Status: Walker Discharge Destination: Home Transportation: Private Auto Accompanied By: self Schedule Follow-up Appointment: Yes Clinical Summary of Care: Patient Declined Electronic Signature(s) Signed: 12/06/2021 12:09:11 PM By: [name] Hammock RN Entered By: [name] on 12/05/2021 14:49:51 -------------------------------------------------------------------------------- Lower Extremity Assessment Details Patient Name: Date of Service: [name], [name] 12/05/2021 1:30 PM Medical Record Number: [id number] Patient Account Number: [id number] Date of Birth/Sex: Treating RN: 1925-09-06 (88 y.o. [name] Primary Care Anesa Fronek: [name] Other Clinician: Referring [name]: Treating Cloie [name]/Extender: [name] in Treatment: 29 Edema Assessment Assessed: [Left: No] [[name]: No] Edema: [Left: No] [[name]: No] Calf [name], [name]  [name] ([id number]) [id number] Page 3 of 11 Left: [name]: Point of Measurement: 30 cm From Medial Instep 31.5 cm 30.5 cm Ankle Left: [name]: Point of Measurement: 9 cm From Medial Instep 23.5 cm 22.5 cm Vascular Assessment Pulses: Dorsalis Pedis Palpable: [Left:Yes] [[name]:Yes] Electronic Signature(s) Signed: 12/05/2021 4:44:04 PM By: [name] Entered By: [name] on 12/05/2021 13:54:28 -------------------------------------------------------------------------------- Multi Wound Chart Details Patient Name: Date of Service: [name], [place]. 12/05/2021 1:30 PM Medical Record Number: [id number] Patient Account Number: [id number] Date of Birth/Sex: Treating RN: 18-Sep-1925 (88 y.o. F) Primary Care [name]: [name] Other Clinician: Referring [name]: Treating [name]/Extender: [name] in Treatment: 32 Vital Signs Height(in): 65 Pulse(bpm): 31 Weight(lbs):  163 Blood Pressure(mmHg): 133/64 Body Mass Index(BMI): 27.1 Temperature(F): 98.2 Respiratory Rate(breaths/min): 18 [20:Photos:] [N/[name]:N/[name]] [name], Circumferential Lower Leg Left, Circumferential Lower Leg N/[name] Wound Location: Gradually Appeared Blister N/[name] Wounding Event: Lymphedema Malignant Wound N/[name] Primary Etiology: Anemia, Arrhythmia, Congestive Heart Anemia, Arrhythmia, Congestive Heart N/[name] Comorbid History: Failure, Hypertension, Colitis, Failure, Hypertension, Colitis, Osteoarthritis Osteoarthritis 06/25/2021 04/20/2020 N/[name] Date Acquired: 23 77 N/[name] Weeks of Treatment: Open Open N/[name] Wound Status: No Yes N/[name] Wound Recurrence: Yes Yes N/[name] Clustered Wound: 30x14x0.1 17x22x0.1 N/[name] Measurements L x W x D (cm) 329.867 293.739 N/[name] [name] (cm) : rea 32.987 29.374 N/[name] Volume (cm) : 17.00% -9464.90% N/[name] % Reduction in Area: 17.00% -9468.10% N/[name] % Reduction in Volume: Full Thickness Without Exposed Full Thickness Without Exposed N/[name] Classification: Support Structures Support Structures Large Large N/[name] Exudate Amount: Serosanguineous Serosanguineous N/[name] Exudate Type: red, brown red, brown N/[name] Exudate Color: Yes Yes N/[name] Foul Odor [name] Cleansing: fter No No N/[name] Odor Anticipated Due to Product Use: DERINDA, BARTUS [name] ([id number]) [phone].pdf Page 4 of 11 Distinct, outline attached Distinct, outline attached N/[name] Wound Margin: Large (67-100%) Small (1-33%) N/[name] Granulation Amount: Red, Pink Red, Pink N/[name] Granulation Quality: Small (1-33%) Large (67-100%) N/[name] Necrotic Amount: Adherent Slough Eschar, Adherent Slough N/[name] Necrotic Tissue: Fat Layer (Subcutaneous Tissue): Yes Fat Layer (Subcutaneous Tissue): Yes N/[name] Exposed Structures: Fascia: No Fascia: No Tendon: No Tendon: No Muscle: No Muscle: No Joint: No Joint: No Bone: No Bone: No None None N/[name] Epithelialization: Excoriation: No Excoriation: No N/[name] Periwound Skin Texture: Induration:  No Induration: No Callus: No Callus: No Crepitus: No Crepitus: No Rash: No Rash: No Scarring: No Scarring: No Maceration: Yes Maceration: Yes N/[name] Periwound Skin Moisture: Dry/Scaly: No Dry/Scaly: No Hemosiderin Staining: Yes Hemosiderin Staining: Yes N/[name] Periwound Skin Color: Atrophie Blanche: No Atrophie Blanche: No Cyanosis: No Cyanosis: No Ecchymosis: No Ecchymosis: No Erythema: No Erythema: No Mottled: No Mottled: No Pallor: No Pallor: No Rubor: No Rubor: No No Abnormality No Abnormality N/[name] Temperature: Yes N/[name] N/[name] Tenderness on Palpation: Compression Therapy Compression Therapy N/[name] Procedures Performed: Treatment Notes Wound #20 (Lower Leg) Wound Laterality: [name], Circumferential Cleanser Soap and Water Discharge Instruction: May shower and wash wound with dial antibacterial soap and water prior to dressing change. Wound Cleanser Discharge Instruction: Cleanse the wound with wound cleanser prior to applying [name] clean dressing using gauze sponges, not tissue or cotton balls. Peri-Wound Care cetaphil lotion Discharge Instruction: patient to provide to nurses to apply to both legs. Topical Gentamicin Discharge Instruction: As directed by physician Mupirocin Ointment Discharge Instruction: Apply Mupirocin (Bactroban) as instructed Primary Dressing KerraCel Ag Gelling Fiber Dressing, 4x5 in (silver alginate) Discharge Instruction: Apply silver alginate to wound bed as instructed Secondary Dressing ABD Pad, 8x10 Discharge Instruction: Apply over primary dressing as directed. Secured With Compression  Wrap ThreePress (3 layer compression wrap) Discharge Instruction: Apply three layer compression as directed. ***PLEASE USE KERLIX INSTEAD OF COTTON.*** Compression Stockings Add-Ons Wound #7RR (Lower Leg) Wound Laterality: Left, Circumferential Cleanser Soap and Water Discharge Instruction: May shower and wash wound with dial antibacterial soap and water  prior to dressing change. Wound Cleanser Discharge Instruction: Cleanse the wound with wound cleanser prior to applying [name] clean dressing using gauze sponges, not tissue or cotton balls. WIN[place]OW, VERRILL [name] ([id number]) [id number] Page 5 of 11 Peri-Wound Care cetaphil lotion Discharge Instruction: patient to provide to nurses to apply to both legs. Topical Gentamicin Discharge Instruction: As directed by physician Mupirocin Ointment Discharge Instruction: Apply Mupirocin (Bactroban) as instructed Primary Dressing KerraCel Ag Gelling Fiber Dressing, 4x5 in (silver alginate) Discharge Instruction: Apply silver alginate to wound bed as instructed Secondary Dressing ABD Pad, 8x10 Discharge Instruction: Apply over primary dressing as directed. Secured With Compression Wrap ThreePress (3 layer compression wrap) Discharge Instruction: Apply three layer compression as directed. ***PLEASE USE KERLIX INSTEAD OF COTTON.*** Compression Stockings Add-Ons Electronic Signature(s) Signed: 12/05/2021 5:05:23 PM By: Kalman Shan DO Entered By: Kalman Shan on 12/05/2021 14:51:06 -------------------------------------------------------------------------------- Multi-Disciplinary Care Plan Details Patient Name: Date of Service: [name], [name]. 12/05/2021 1:30 PM Medical Record Number: [id number] Patient Account Number: [id number] Date of Birth/Sex: Treating RN: Oct 14, 1925 (88 y.o. [name], [name] Primary Care Kayline Sheer: [name] Other Clinician: Referring Larri Yehle: Treating Violet Cart/Extender: [name] in Treatment: 13 Multidisciplinary Care Plan reviewed with physician Active Inactive Venous Leg Ulcer Nursing Diagnoses: Knowledge deficit related to disease process and management Potential for venous Insuffiency (use before diagnosis confirmed) Goals: Patient will maintain optimal edema control Date Initiated:  11/01/2020 Target Resolution Date: 12/20/2021 Goal Status: Active Interventions: Assess peripheral edema status every visit. Compression as ordered Provide education on venous insufficiency Treatment Activities: Therapeutic compression applied : 11/01/2020 Notes: ARRETTA, TOENJES [name] ([id number]) [phone].pdf Page 6 of 11 12/27/20: Edema control ongoing 01/24/21: Edema control continues, using 3 layer compression Wound/Skin Impairment Nursing Diagnoses: Impaired tissue integrity Knowledge deficit related to ulceration/compromised skin integrity Goals: Patient/caregiver will verbalize understanding of skin care regimen Date Initiated: 06/11/2020 Target Resolution Date: 12/20/2021 Goal Status: Active Interventions: Assess patient/caregiver ability to obtain necessary supplies Assess patient/caregiver ability to perform ulcer/skin care regimen upon admission and as needed Assess ulceration(s) every visit Provide education on ulcer and skin care Notes: 10/11/20: Wound care regimen ongoing. 12/27/20: Wound care continues Electronic Signature(s) Signed: 12/06/2021 12:09:11 PM By: [name] Hammock RN Entered By: [name] on 12/05/2021 14:46:49 -------------------------------------------------------------------------------- Pain Assessment Details Patient Name: Date of Service: [name], [place]. 12/05/2021 1:30 PM Medical Record Number: [id number] Patient Account Number: [id number] Date of Birth/Sex: Treating RN: 01-22-1925 (88 y.o. [name] Primary Care Adisyn Ruscitti: [name] Other Clinician: Referring Lleyton Byers: Treating Elly Haffey/Extender: [name] in Treatment: 6 Active Problems Location of Pain Severity and Description of Pain Patient Has Paino No Site Locations Rate the pain. Current Pain Level: 0 Pain Management and Medication Current Pain Management: Electronic Signature(s) Signed: 12/05/2021  4:44:04 PM By: [name] Entered By: [name] on 12/05/2021 13:52:38 Paterson, Fatime [name] ([id number]) [phone].pdf Page 7 of 11 -------------------------------------------------------------------------------- Patient/Caregiver Education Details Patient Name: Date of Service: [name], [name]. 11/16/2023andnbsp1:30 PM Medical Record Number: [id number] Patient Account Number: [id number] Date of Birth/Gender: Treating RN: Jul 17, 1925 (88 y.o. [name], [name] Primary Care Physician: [name] Other Clinician: Referring Physician: Treating Physician/Extender: [name]  in Treatment: 28 Education Assessment Education Provided To: Patient Education Topics Provided Venous: Methods: Explain/Verbal Responses: State content correctly Electronic Signature(s) Signed: 12/06/2021 12:09:11 PM By: [name] Hammock RN Entered By: [name] on 12/05/2021 14:47:04 -------------------------------------------------------------------------------- Wound Assessment Details Patient Name: Date of Service: [name], [place]. 12/05/2021 1:30 PM Medical Record Number: [id number] Patient Account Number: [id number] Date of Birth/Sex: Treating RN: [birth year] (88 y.o. [name] Primary Care Tyeson Tanimoto: [name] Other Clinician: Referring Chason Mciver: Treating Derhonda Eastlick/Extender: [name] in Treatment: 41 Wound Status Wound Number: 20 Primary Lymphedema Etiology: Wound Location: [name], Circumferential Lower Leg Wound Open Wounding Event: Gradually Appeared Status: Date Acquired: 06/25/2021 Comorbid Anemia, Arrhythmia, Congestive Heart Failure, Hypertension, Weeks Of Treatment: 23 History: Colitis, Osteoarthritis Clustered Wound: Yes Photos Wound Measurements Length: (cm) 30 Larranaga, Brit [name] ([id number]) Width: (cm) 14 Depth: (cm) 0.1 Area: (cm)  329.867 Volume: (cm) 32.987 % Reduction in Area: 17% [id number] Page 8 of 11 % Reduction in Volume: 17% Epithelialization: None Tunneling: No Undermining: No Wound Description Classification: Full Thickness Without Exposed Support Structures Wound Margin: Distinct, outline attached Exudate Amount: Large Exudate Type: Serosanguineous Exudate Color: red, brown Foul Odor After Cleansing: Yes Due to Product Use: No Slough/Fibrino Yes Wound Bed Granulation Amount: Large (67-100%) Exposed Structure Granulation Quality: Red, Pink Fascia Exposed: No Necrotic Amount: Small (1-33%) Fat Layer (Subcutaneous Tissue) Exposed: Yes Necrotic Quality: Adherent Slough Tendon Exposed: No Muscle Exposed: No Joint Exposed: No Bone Exposed: No Periwound Skin Texture Texture Color No Abnormalities Noted: No No Abnormalities Noted: No Callus: No Atrophie Blanche: No Crepitus: No Cyanosis: No Excoriation: No Ecchymosis: No Induration: No Erythema: No Rash: No Hemosiderin Staining: Yes Scarring: No Mottled: No Pallor: No Moisture Rubor: No No Abnormalities Noted: No Dry / Scaly: No Temperature / Pain Maceration: Yes Temperature: No Abnormality Tenderness on Palpation: Yes Treatment Notes Wound #20 (Lower Leg) Wound Laterality: [name], Circumferential Cleanser Soap and Water Discharge Instruction: May shower and wash wound with dial antibacterial soap and water prior to dressing change. Wound Cleanser Discharge Instruction: Cleanse the wound with wound cleanser prior to applying [name] clean dressing using gauze sponges, not tissue or cotton balls. Peri-Wound Care cetaphil lotion Discharge Instruction: patient to provide to nurses to apply to both legs. Topical Gentamicin Discharge Instruction: As directed by physician Mupirocin Ointment Discharge Instruction: Apply Mupirocin (Bactroban) as instructed Primary Dressing KerraCel Ag Gelling Fiber Dressing,  4x5 in (silver alginate) Discharge Instruction: Apply silver alginate to wound bed as instructed Secondary Dressing ABD Pad, 8x10 Discharge Instruction: Apply over primary dressing as directed. Secured With Compression Wrap ThreePress (3 layer compression wrap) Discharge Instruction: Apply three layer compression as directed. ***PLEASE USE KERLIX INSTEAD OF COTTON.*** Compression Stockings Add-Ons TAMYRAH, BURBAGE [name] ([id number]) [phone].pdf Page 9 of 11 Electronic Signature(s) Signed: 12/05/2021 4:44:04 PM By: [name] Entered By: [name] on 12/05/2021 14:00:16 -------------------------------------------------------------------------------- Wound Assessment Details Patient Name: Date of Service: [name], [place]. 12/05/2021 1:30 PM Medical Record Number: [id number] Patient Account Number: [id number] Date of Birth/Sex: Treating RN: 23-Sep-1925 (88 y.o. [name] Primary Care Correen Bubolz: [name] Other Clinician: Referring Taneika Choi: Treating Kenidi Elenbaas/Extender: [name] in Treatment: 69 Wound Status Wound Number: 7RR Primary Malignant Wound Etiology: Wound Location: Left, Circumferential Lower Leg Wound Open Wounding Event: Blister Status: Date Acquired: 04/20/2020 Comorbid Anemia, Arrhythmia, Congestive Heart Failure, Hypertension, Weeks Of Treatment: 77 History: Colitis, Osteoarthritis Clustered Wound: Yes Photos Wound Measurements Length: (cm) 17 Width: (cm) 22 Depth: (cm)  0.1 Area: (cm) 293.739 Volume: (cm) 29.374 % Reduction in Area: -9464.9% % Reduction in Volume: -9468.1% Epithelialization: None Tunneling: No Undermining: No Wound Description Classification: Full Thickness Without Exposed Suppo Wound Margin: Distinct, outline attached Exudate Amount: Large Exudate Type: Serosanguineous Exudate Color: red, brown rt Structures Foul Odor After Cleansing: Yes Due  to Product Use: No Slough/Fibrino Yes Wound Bed Granulation Amount: Small (1-33%) Exposed Structure Granulation Quality: Red, Pink Fascia Exposed: No Necrotic Amount: Large (67-100%) Fat Layer (Subcutaneous Tissue) Exposed: Yes Necrotic Quality: Eschar, Adherent Slough Tendon Exposed: No Muscle Exposed: No Joint Exposed: No Bone Exposed: No Periwound Skin Texture Texture Color No Abnormalities Noted: No No Abnormalities Noted: No Callus: No Atrophie Blanche: No Crepitus: No Cyanosis: No Excoriation: No Ecchymosis: No Induration: No Erythema: No Rash: No Hemosiderin Staining: GENETTA, FIERO [name] ([id number]) [id number] Page 10 of 11 Scarring: No Mottled: No Pallor: No Moisture Rubor: No No Abnormalities Noted: No Dry / Scaly: No Temperature / Pain Maceration: Yes Temperature: No Abnormality Treatment Notes Wound #7RR (Lower Leg) Wound Laterality: Left, Circumferential Cleanser Soap and Water Discharge Instruction: May shower and wash wound with dial antibacterial soap and water prior to dressing change. Wound Cleanser Discharge Instruction: Cleanse the wound with wound cleanser prior to applying [name] clean dressing using gauze sponges, not tissue or cotton balls. Peri-Wound Care cetaphil lotion Discharge Instruction: patient to provide to nurses to apply to both legs. Topical Gentamicin Discharge Instruction: As directed by physician Mupirocin Ointment Discharge Instruction: Apply Mupirocin (Bactroban) as instructed Primary Dressing KerraCel Ag Gelling Fiber Dressing, 4x5 in (silver alginate) Discharge Instruction: Apply silver alginate to wound bed as instructed Secondary Dressing ABD Pad, 8x10 Discharge Instruction: Apply over primary dressing as directed. Secured With Compression Wrap ThreePress (3 layer compression wrap) Discharge Instruction: Apply three layer compression as directed. ***PLEASE USE KERLIX INSTEAD OF  COTTON.*** Compression Stockings Add-Ons Electronic Signature(s) Signed: 12/05/2021 4:44:04 PM By: [name] Entered By: [name] on 12/05/2021 14:01:02 -------------------------------------------------------------------------------- Vitals Details Patient Name: Date of Service: [name], [name]. 12/05/2021 1:30 PM Medical Record Number: [id number] Patient Account Number: [id number] Date of Birth/Sex: Treating RN: [birth year] (88 y.o. [name] Primary Care Mayerly Kaman: [name] Other Clinician: Referring Bernie Fobes: Treating Fredi Hurtado/Extender: [name] in Treatment: 61 Vital Signs Time Taken: 13:52 Temperature (F): 98.2 Height (in): 65 Pulse (bpm): 67 Weight (lbs): 163 Respiratory Rate (breaths/min): 18 Body Mass Index (BMI): 27.1 Blood Pressure (mmHg): 133/64 Reference Range: 80 - 120 mg / dl Haws, Dejanique [name] ([id number]) [phone].pdf Page 11 of 11 Electronic Signature(s) Signed: 12/05/2021 4:44:04 PM By: [name] Entered By: [name] on 12/05/2021 13:52:28

## 2021-12-06 NOTE — Progress Notes (Signed)
[name] [name], [name] [name] ([id number]) [id number] Page 1 of 13 Visit Report for 12/05/2021 Chief Complaint Document Details Patient Name: Date of Service: [name] [name], [place]. 12/05/2021 1:30 PM Medical Record Number: [id number] Patient Account Number: [id number] Date of Birth/Sex: Treating RN: 27-Mar-1925 (88 y.o. F) Primary Care Provider: [name] Other Clinician: Referring Provider: Treating Provider/Extender: [name] in Treatment: 54 Information Obtained from: Patient Chief Complaint Bilateral lower extremity wounds that have been biopsied and positive for squamous cell carcinoma Bilateral lower extremity wounds due to chronic venous insufficiency/lymphedema Electronic Signature(s) Signed: 12/05/2021 5:05:23 PM By: Kalman Shan DO Entered By: Kalman Shan on 12/05/2021 14:51:15 -------------------------------------------------------------------------------- HPI Details Patient Name: Date of Service: [name] [name], [place]. 12/05/2021 1:30 PM Medical Record Number: [id number] Patient Account Number: [id number] Date of Birth/Sex: Treating RN: [birth year] (88 y.o. 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 She states that the wound on the right has been present for [name] year. The 1 on the left has opened up 1 month ago. She is followed with oncology for this issue as she had biopsies that showed squamous cell carcinoma. She is also seeing  radiation oncology for treatment options. She presents today because she would like for her wounds to be healed by [place]. She currently denies signs of infection. 6/1; patient presents for 1 week follow-up. She states she has tolerated the leg wraps well. She states these do not bother her and is happy to continue with them. She is scheduled to see her oncologist today to go over treatment options for the bilateral lower extremity squamous cell carcinoma. Radiation is currently not [name] recommended option. Patient states she overall feels well. 6/22; patient presents for 3-week follow-up. She has tolerated the wraps well until her last wrap where she states they were uncomfortable. She attributes this to the home health nurse. She denies signs of infection. She has started her first treatment of antibody infusions for her Bilateral lower extremity squamous cell carcinoma. She has no complaints today. 7/21; patient presents for 1 month follow-up. Unfortunately she has not had good experience with her wrap changes with home health. She would like to do her own dressing changes. She continues to do her antibody infusions. She denies signs of infection. 7/28; patient presents for 1 week follow-up. At last clinic visit she was switched to daily dressing changes due to issues with the wrap and home health placing them. Unfortunately she has developed weeping to her legs bilaterally. She would like to be placed in wraps today. She would also like to follow with [place] weekly for wrap changes instead of having home health change them. She denies signs of infection. 8/4; patient presents for 1 week follow-up. She has tolerated the Kerlix/Coban wraps well. She no longer has weeping to her legs. She took the wrap off 1 day before coming in to be able to take [name] shower. She has no issues or complaints today. She denies signs of infection. 8/18; patient presents for follow-up. Patient has tolerated the wraps well. She  brought her Velcro compression wraps today. She has no  issues or complaints today. She had her chemotherapy infusion yesterday without issues. She denies signs of infection. 8/25; patient presents for follow-up. She used her juxta lite compressions for the past week. It is unclear if she is able to put these on correctly since she NIANA, MARTORANA ([id number]) [id number] Page 2 of 13 states she has [name] hard time getting them to look right. She reports 2 open wounds. She currently denies signs of infection. 9/1; patient presents for 1 week follow-up. She has 1 open wound. She tolerated the compression wraps well. She currently denies signs of infection. 9/8; patient presents for 1 week follow-up. She has 2 open wounds 1 on each leg. She has tolerated the compression wrap well. She currently denies signs of infection. 9/15; patient presents for 1 week follow-up. She now has 3 wounds. 2 on the left and 1 on the right. She continues to tolerate the compression wrap well. She currently denies signs of infection. She has obtained furosemide by her primary care physician and would like to discuss when to take this. 9/22; patient presents for 1 week follow-up. She has scattered wounds on her lower extremities bilaterally. She did take furosemide twice in the past week. She does not recall having to urinate more frequently. She tolerated the 3 layer compression well. She denies signs of infection. 10/13; patient has not been here recently because of [place]. Apparently the facility was only putting gauze on her legs. This is [name] patient I do not normally see. She has [name] history of squamous cell carcinoma bilaterally on her anterior lower legs followed for [name] period of time by Dr. Ronnald Ramp at [facility] dermatology. She is quite convincing that she did not have radiation to her lower legs. It is likely she also has significant chronic venous insufficiency stasis dermatitis. We have been using  silver alginate under kerlix Coban. She has obvious open areas medially on the right and areas on the left. She also has areas of extensive dry flaking adherent areas on the right and to [name] lesser extent on the left anterior. Nodular areas on the left lateral lower leg left posterior calf and right mid calf medially. 10/21; patient presents for follow-up. She has no issues or complaints today. She has tolerated the 3 layer compression well bilaterally. She denies signs of infection. 10/27; patient presents for follow-up. She continues to tolerate the 3 layer compression wrap well. She denies signs of infection. 11/30; patient presents for follow-up. She has no issues or complaints today. 11/10; patient arrived in clinic today for nurse visit accompanied by her daughter from [place]. The daughter had multiple questions so we turned this into doctors visit. Apparently after the last time I saw this woman in October she went to see Dr. Ronnald Ramp but he did not take the wraps off. She previously was treated with Cemiplimab for her squamous cell carcinoma. She did not receive radiation to her legs. I had wanted Dr. Ronnald Ramp to look at this because of the exceptionally damaged skin on her lower legs bilaterally. She has odd looking wounds on the left medial lower leg also extending posteriorly which we have not made [name] lot of progress on. But she also has [name] raised hyperkeratotic nodules on the right anterior tibial area plaques on the left medial lower thigh. These are not areas that are under compression. We have been using silver alginate on any open areas Liberal TCA under 3 layer compression. 11/17; patient presents for follow-up. She had her wraps taken  off yesterday for her dermatology appointment. She reports excessive weeping to her legs bilaterally after the wraps were taken off. She currently denies signs of infection. 12/12/2020 upon evaluation today patient appears to be doing about the same in regard to  her wounds. She was actually started on Cipro after having biopsies apparently at her dermatology clinic she does not have the results back from the actual biopsy but the culture did return and apparently they placed her on the Cipro she started that just this morning. Other than that her legs appear to be doing okay at this time. 12/1; patient presents for follow-up. She has no issues or complaints today. She has started Lasix 40 mg daily to help with her bilateral leg swelling. 12/8; patient presents for follow-up. She has no issues or complaints today. 12/15; patient presents for 1 week follow-up. She has no issues or complaints today. 12/22; patient presents for follow-up. She has no issues or complaints today. 1/5; patient presents for follow-up. She has no issues or complaints today. She has tolerated the compression wraps well. 1/12; patient presents for follow-up. She is tolerated the compression wrap well and has no issues or complaints today. She denies signs of infection. 1/19; patient presents for follow-up. She took the compression wraps off 1 to 2 days ago to take [name] shower and did not have compression wraps replaced. She reports increased blistering throughout her legs bilaterally. She currently denies signs of infection. 1/26; patient presents for follow-up. She has no issues or complaints today. She tolerated the compression wraps well. She has not heard from oncology to schedule an appointment. 2/2; patient presents for follow-up. She has no issues or complaints today. She continues to tolerate the compression wrap well. 2/16; patient presents for follow-up. She has no issues or complaints today. 2/23; patient presents for follow-up. She has no issues or complaints today. Tomorrow she has an appointment with oncology to look at the suspicious lesion on her right lower extremity. She currently denies signs of infection. 3/2; patient presents for follow-up. She has been using her  juxta light compression to the right lower extremity however there has been increased swelling and opening of wounds throughout the legs with weeping. She currently denies signs of infection. She had no issues with the compression wrap to the left lower extremity. 3/16; patient presents for follow-up. She has no issues or complaints today. She states that she has been restarted on infusions for her squamous cell carcinoma of her legs. She has also been increased on Lasix to 40 mg twice daily. She has noticed [name] decrease in swelling to her legs. 3/23; patient presents for follow-up. She starts her second infusion next week for her squamous cell carcinoma lesions to her legs. She reports no issues with the compression wraps. 3/30; patient presents for follow-up. She has no issues or complaints today. 4/6; patient presents for follow-up. Her left lower extremity wounds have healed. She has her juxta light compression with her today. She has no issues or complaints today. 4/13; this is [name] patient we actually discharged to juxta lite stockings on the left leg the last time she was here. She came in on Monday for [place] to look at the leg and [name] nurse visit. She had massive swelling and numerous blisters and wound reopening. We put her back in 3 layer compression although I did not generate [name] note. Silver alginate on the wounded areas. We have also been doing the same on the right. In the meantime  she is obtained her external compression pumps that were previously ordered and she started to use them. She has skin cancers that are obvious on the right leg x2 her appointment with Dr. Jarome Matin of dermatology is on May 5.. She is also receiving weekly chemotherapy for recurrent presumably squamous cell carcinomas apparently has had 2 treatments 4/20; patient presents for follow-up. She has her new juxta lite compressions today. She continues to have open wounds to the left lower extremity. She has follow-up with  dermatology in 2 weeks. She continues to have IV infusions for her squamous cell carcinoma lesions on her legs. KIARA, MCDOWELL [name] ([id number]) [id number] Page 3 of 13 4/28; the patient has bilateral lower extremity wounds predominantly on the right lateral upper leg, right anterior lower leg which in itself is probably [name] recurrent cancer as well as the left lateral lower leg. She has recurrent squamous cell carcinoma currently being treated with an IV infusion. She also has scattered nodules on her upper lower legs and thighs I am not certain of all of this is felt to be malignant as well We have been using silver alginate on the open areas wrapping her legs. She also has her external compression pumps at home but I am not clear that she is going to use these 5/2; patient presents for follow-up. She has not been taking her diuretics as prescribed. She sees Dr. Dian Situ, dermatology at the end of the week. She tolerated the compression wraps well today. She has her juxta lite compressions. 5/8; patient presents for follow-up. She saw Dr. Dian Situ, dermatology and she has nothing to report on. She has been using her juxta lite compression to the right lower extremity. She has tolerated the compression wrap well in the left lower extremity. She has not been taking her diuretic. She is scheduled to continue her infusions for her squamous cell carcinoma on her legs. 5/18; patient presents for follow-up. We wrapped her in 3 layer compression at last clinic visit. She has no issues or complaints today. 5/22; patient presents for follow-up. She has been wrapped in 3 layer compression bilaterally to her lower extremities over the past week. She has been scratching at the top of the wrap and picking at her skin. This area has increased warmth and erythema. 5/30; patient presents for follow-up. She completed Keflex with resolution of her symptoms to the right lower extremity. She no longer has  increased warmth or erythema. She continues her infusions for her squamous cell carcinoma lesions. We continue to wrap her in 3 layer compression with silver alginate. She currently denies signs of infection. 6/8; the patient went to see Dr. Ronnald Ramp of dermatology 2 days ago unfortunately we do not have his notes. They removed her compression wraps of course but did not adequately replace them. She comes in today with 3 wounds on the right lateral upper calf 1 medially below the obvious squamous cell carcinoma there is [name] large necrotic wound on the left lateral tibial area on the left and several blisters. The patient remains on Cemiplimad infusions for the squamous cell carcinoma bilaterally. She is not felt to be [name] good candidate for radiation. Some of the skin changes superiorly in the upper legs bilaterally according to Dr. Ronnald Ramp related to these infusions the same like fleshy nodules to me not blisters 6/13; patient presents for follow-up. We have been using silver alginate under 3 layer compression bilaterally. She has no issues or complaints today. 6/19; patient presents for follow-up. She  will see her oncologist in 3 days and next week her dermatologist. She has no new complaints today. We are using silver alginate under 3 layer compression bilaterally. 6/29; patient presents for follow-up. She she saw Dr. Darrin Nipper last week and States she received injections to the lesions on her legs. We will ask for office visit records. She could not be wrapped with compression and has more drainage on exam today. 7/6; patient presents for follow-up. She continues to receive injections to the lesions in her legs by her dermatologist. She had this done earlier today. She has no issues or complaints today. 7/13; patient presents for follow-up. She is following with Dr.Kavuri once weekly for treatment of her SCCs/KAs with IL5-FU. We have been wrapping her with silver alginate under Kerlix/Coban. She has no  issues or complaints today. 7/20; patient presents for follow-up. She has elected to stop doing weekly treatments for her squamous cell carcinoma lesions. She states she experiences too much pain from the injections. She is continuing her immunotherapy infusions. Next 1 is scheduled in August. T the legs we have been using silver alginate o under Kerlix/Coban. Her wounds actually look better today. 7/27; patient presents for follow-up. We have been using Medihoney and silver alginate under compression therapy. She has no issues or complaints today. 8/1; patient presents for follow-up. We have been using Medihoney and silver alginate under compression therapy. She reports no issues. She is scheduled for her infusion for her SCC later this week. 8/22; patient presents for follow-up. We have been using Medihoney and silver alginate under compression therapy. She reports no issues today. She has moved back home from the skilled nursing facility. She has home health that will be coming out to do OT and PT She would like for them to also do wound . care. 9/18; patient presents for follow-up. We have been using Medihoney and silver alginate under compression therapy. Home health is no longer coming out for wound care. She comes in weekly for wrap changes in our office During nurse visits. 10/2; patient presents for follow-up. We have been using silver alginate under compression therapy. She has no issues or complaints today. She states she is doing her last chemoinfusion next week. 10/23; Patient presents for follow-up. We continue to use silver alginate under compression therapy. She has [name] few more wounds present today. She states she is taking her lasix as prescribed. She has completed her infusions for her SCC lesions. 11/16; patient presents for follow-up. At last clinic visit she had more drainage and irritation and I recommended adding gentamicin and mupirocin with ketoconazole under the dressing  and wrap. T oday her wounds have improved in appearance and drainage. She has no issues or complaints today. Electronic Signature(s) Signed: 12/05/2021 5:05:23 PM By: Kalman Shan DO Entered By: Kalman Shan on 12/05/2021 14:52:07 -------------------------------------------------------------------------------- Physical Exam Details Patient Name: Date of Service: [name] [name], [place]. 12/05/2021 1:30 PM Medical Record Number: [id number] Patient Account Number: [id number] Date of Birth/Sex: Treating RN: Mar 08, 1925 (88 y.o. F) Primary Care Provider: [name] Other Clinician: INGRID, SHIFRIN ([id number]) [id number] Page 4 of 13 Referring Provider: Treating Provider/Extender: [name] in Treatment: 1 Constitutional respirations regular, non-labored and within target range for patient.. Cardiovascular 2+ dorsalis pedis/posterior tibialis pulses. Psychiatric pleasant and cooperative. Notes Multiple scattered open wounds to her lower extremities bilaterally With granulation tissue and scant nonviable tissue. No surrounding signs of infection. Adequate edema control. Lymphedema skin changes. Electronic Signature(s) Signed: 12/05/2021  5:05:23 PM By: Kalman Shan DO Entered By: Kalman Shan on 12/05/2021 14:52:58 -------------------------------------------------------------------------------- Physician Orders Details Patient Name: Date of Service: [name] [name], [place]. 12/05/2021 1:30 PM Medical Record Number: [id number] Patient Account Number: [id number] Date of Birth/Sex: Treating RN: 27-Mar-1925 (88 y.o. [name], [name] Primary Care Provider: [name] Other Clinician: Referring Provider: Treating Provider/Extender: [name] in Treatment: 62 Verbal / Phone Orders: No Diagnosis Coding Follow-up Appointments Return appointment in 3 weeks. - Dr.  Heber Ames Nurse Visit: - Weekly and every 3 weeks see Dr. Heber [place]. Bathing/ Shower/ Hygiene May shower with protection but do not get wound dressing(s) wet. May shower and wash wound with soap and water. - with dressing changes may wash wounds with soap and water. Edema Control - Lymphedema / SCD / Other Lymphedema Pumps. Use Lymphedema pumps on leg(s) 2-3 times [name] day for 45-60 minutes. If wearing any wraps or hose, do not remove them. Continue exercising as instructed. - use one hour each time twice [name] day. Elevate legs to the level of the heart or above for 30 minutes daily and/or when sitting, [name] frequency of: - elevate the legs throughout the day heart level if possible. Avoid standing for long periods of time. Exercise regularly Additional Orders / Instructions Follow Nutritious Diet Wound Treatment Wound #20 - Lower Leg Wound Laterality: Right, Circumferential Cleanser: Soap and Water 1 x Per Week/30 Days Discharge Instructions: May shower and wash wound with dial antibacterial soap and water prior to dressing change. Cleanser: Wound Cleanser 1 x Per Week/30 Days Discharge Instructions: Cleanse the wound with wound cleanser prior to applying [name] clean dressing using gauze sponges, not tissue or cotton balls. Peri-Wound Care: cetaphil lotion 1 x Per Week/30 Days Discharge Instructions: patient to provide to nurses to apply to both legs. Topical: Gentamicin 1 x Per Week/30 Days Discharge Instructions: As directed by physician Topical: Mupirocin Ointment 1 x Per Week/30 Days Discharge Instructions: Apply Mupirocin (Bactroban) as instructed JENAI, SCALETTA [name] ([id number]) [id number] Page 5 of 13 Prim Dressing: KerraCel Ag Gelling Fiber Dressing, 4x5 in (silver alginate) 1 x Per Week/30 Days ary Discharge Instructions: Apply silver alginate to wound bed as instructed Secondary Dressing: ABD Pad, 8x10 1 x Per Week/30 Days Discharge Instructions: Apply over primary  dressing as directed. Compression Wrap: ThreePress (3 layer compression wrap) 1 x Per Week/30 Days Discharge Instructions: Apply three layer compression as directed. ***PLEASE USE KERLIX INSTEAD OF COTTON.*** Wound #7RR - Lower Leg Wound Laterality: Left, Circumferential Cleanser: Soap and Water 1 x Per Week/30 Days Discharge Instructions: May shower and wash wound with dial antibacterial soap and water prior to dressing change. Cleanser: Wound Cleanser 1 x Per Week/30 Days Discharge Instructions: Cleanse the wound with wound cleanser prior to applying [name] clean dressing using gauze sponges, not tissue or cotton balls. Peri-Wound Care: cetaphil lotion 1 x Per Week/30 Days Discharge Instructions: patient to provide to nurses to apply to both legs. Topical: Gentamicin 1 x Per Week/30 Days Discharge Instructions: As directed by physician Topical: Mupirocin Ointment 1 x Per Week/30 Days Discharge Instructions: Apply Mupirocin (Bactroban) as instructed Prim Dressing: KerraCel Ag Gelling Fiber Dressing, 4x5 in (silver alginate) 1 x Per Week/30 Days ary Discharge Instructions: Apply silver alginate to wound bed as instructed Secondary Dressing: ABD Pad, 8x10 1 x Per Week/30 Days Discharge Instructions: Apply over primary dressing as directed. Compression Wrap: ThreePress (3 layer compression wrap) 1 x Per Week/30 Days Discharge Instructions: Apply three layer compression as directed. ***  PLEASE USE KERLIX INSTEAD OF COTTON.*** Electronic Signature(s) Signed: 12/05/2021 5:05:23 PM By: Kalman Shan DO Entered By: Kalman Shan on 12/05/2021 14:53:08 -------------------------------------------------------------------------------- Problem List Details Patient Name: Date of Service: [name] [name], [place]. 12/05/2021 1:30 PM Medical Record Number: [id number] Patient Account Number: [id number] Date of Birth/Sex: Treating RN: [birth year] (88 y.o. F) Primary Care Provider: [name] Other  Clinician: Referring Provider: Treating Provider/Extender: [name] in Treatment: 9 Active Problems ICD-10 Encounter Code Description Active Date MDM Diagnosis C44.92 Squamous cell carcinoma of skin, unspecified 06/11/2020 No Yes L97.812 Non-pressure chronic ulcer of other part of right lower leg with fat layer 06/11/2020 No Yes exposed L97.822 Non-pressure chronic ulcer of other part of left lower leg with fat layer exposed5/23/2022 No Yes Haefele, Ruben [name] ([id number]) [id number] Page 6 of 13 I89.0 Lymphedema, not elsewhere classified 01/31/2021 No Yes I87.313 Chronic venous hypertension (idiopathic) with ulcer of bilateral lower extremity 03/21/2021 No Yes Inactive Problems Resolved Problems Electronic Signature(s) Signed: 12/05/2021 5:05:23 PM By: Kalman Shan DO Entered By: Kalman Shan on 12/05/2021 14:51:00 -------------------------------------------------------------------------------- Progress Note Details Patient Name: Date of Service: [name] [name], Shirley. 12/05/2021 1:30 PM Medical Record Number: [id number] Patient Account Number: [id number] Date of Birth/Sex: Treating RN: [id number] (88 y.o. F) Primary Care Provider: [name] Other Clinician: Referring Provider: Treating Provider/Extender: [name] in Treatment: 39 Subjective Chief Complaint Information obtained from Patient Bilateral lower extremity wounds that have been biopsied and positive for squamous cell carcinoma Bilateral lower extremity wounds due to chronic venous insufficiency/lymphedema History of Present Illness (HPI) The following HPI elements were documented for the patient's wound: Location: left leg Admission 5/23 [name] [name] is [name] 88 year old female with [name] past medical history of 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 She states that the wound on the right has been present for [name] year. The 1 on the left has opened up 1 month ago. She is followed with oncology for this issue as she had biopsies that showed squamous cell carcinoma. She is also seeing radiation oncology for treatment options. She presents today because she would like for her wounds to be healed by [place]. She currently denies signs of infection. 6/1; patient presents for 1 week follow-up. She states she has tolerated the leg wraps well. She states these do not bother her and is happy to continue with them. She is scheduled to see her oncologist today to go over treatment options for the bilateral lower extremity squamous cell carcinoma. Radiation is currently not [name] recommended option. Patient states she overall feels well. 6/22; patient presents for 3-week follow-up. She has tolerated the wraps well until her last wrap where she states they were uncomfortable. She attributes this to the home health nurse. She denies signs of infection. She has started her first treatment of antibody infusions for her Bilateral lower extremity squamous cell carcinoma. She has no complaints today. 7/21; patient presents for 1 month follow-up. Unfortunately she has not had good experience with her wrap changes with home health. She would like to do her own dressing changes. She continues to do her antibody infusions. She denies signs of infection. 7/28; patient presents for 1 week follow-up. At last clinic visit she was switched to daily dressing changes due to issues with the wrap and home health placing them. Unfortunately she has  developed weeping to her legs bilaterally. She would like to be placed in wraps today. She would also like to follow with [place] weekly for wrap changes instead of having home health change them. She denies signs of infection. 8/4; patient  presents for 1 week follow-up. She has tolerated the Kerlix/Coban wraps well. She no longer has weeping to her legs. She took the wrap off 1 day before coming in to be able to take [name] shower. She has no issues or complaints today. She denies signs of infection. 8/18; patient presents for follow-up. Patient has tolerated the wraps well. She brought her Velcro compression wraps today. She has no issues or complaints today. She had her chemotherapy infusion yesterday without issues. She denies signs of infection. 8/25; patient presents for follow-up. She used her juxta lite compressions for the past week. It is unclear if she is able to put these on correctly since she states she has [name] hard time getting them to look right. She reports 2 open wounds. She currently denies signs of infection. 9/1; patient presents for 1 week follow-up. She has 1 open wound. She tolerated the compression wraps well. She currently denies signs of infection. 9/8; patient presents for 1 week follow-up. She has 2 open wounds 1 on each leg. She has tolerated the compression wrap well. She currently denies signs of infection. 9/15; patient presents for 1 week follow-up. She now has 3 wounds. 2 on the left and 1 on the right. She continues to tolerate the compression wrap well. She SHULAMIT, DONOFRIO [name] ([id number]) [id number] Page 7 of 13 currently denies signs of infection. She has obtained furosemide by her primary care physician and would like to discuss when to take this. 9/22; patient presents for 1 week follow-up. She has scattered wounds on her lower extremities bilaterally. She did take furosemide twice in the past week. She does not recall having to urinate more frequently. She tolerated the 3 layer compression well. She denies signs of infection. 10/13; patient has not been here recently because of [place]. Apparently the facility was only putting gauze on her legs. This is [name] patient I do not normally  see. She has [name] history of squamous cell carcinoma bilaterally on her anterior lower legs followed for [name] period of time by Dr. Ronnald Ramp at [facility] dermatology. She is quite convincing that she did not have radiation to her lower legs. It is likely she also has significant chronic venous insufficiency stasis dermatitis. We have been using silver alginate under kerlix Coban. She has obvious open areas medially on the right and areas on the left. She also has areas of extensive dry flaking adherent areas on the right and to [name] lesser extent on the left anterior. Nodular areas on the left lateral lower leg left posterior calf and right mid calf medially. 10/21; patient presents for follow-up. She has no issues or complaints today. She has tolerated the 3 layer compression well bilaterally. She denies signs of infection. 10/27; patient presents for follow-up. She continues to tolerate the 3 layer compression wrap well. She denies signs of infection. 11/30; patient presents for follow-up. She has no issues or complaints today. 11/10; patient arrived in clinic today for nurse visit accompanied by her daughter from [place]. The daughter had multiple questions so we turned this into doctors visit. Apparently after the last time I saw this woman in October she went to see Dr. Ronnald Ramp but he did not take the wraps off. She previously was treated with Cemiplimab  for her squamous cell carcinoma. She did not receive radiation to her legs. I had wanted Dr. Ronnald Ramp to look at this because of the exceptionally damaged skin on her lower legs bilaterally. She has odd looking wounds on the left medial lower leg also extending posteriorly which we have not made [name] lot of progress on. But she also has [name] raised hyperkeratotic nodules on the right anterior tibial area plaques on the left medial lower thigh. These are not areas that are under compression. We have been using silver alginate on any open areas Liberal TCA under 3 layer  compression. 11/17; patient presents for follow-up. She had her wraps taken off yesterday for her dermatology appointment. She reports excessive weeping to her legs bilaterally after the wraps were taken off. She currently denies signs of infection. 12/12/2020 upon evaluation today patient appears to be doing about the same in regard to her wounds. She was actually started on Cipro after having biopsies apparently at her dermatology clinic she does not have the results back from the actual biopsy but the culture did return and apparently they placed her on the Cipro she started that just this morning. Other than that her legs appear to be doing okay at this time. 12/1; patient presents for follow-up. She has no issues or complaints today. She has started Lasix 40 mg daily to help with her bilateral leg swelling. 12/8; patient presents for follow-up. She has no issues or complaints today. 12/15; patient presents for 1 week follow-up. She has no issues or complaints today. 12/22; patient presents for follow-up. She has no issues or complaints today. 1/5; patient presents for follow-up. She has no issues or complaints today. She has tolerated the compression wraps well. 1/12; patient presents for follow-up. She is tolerated the compression wrap well and has no issues or complaints today. She denies signs of infection. 1/19; patient presents for follow-up. She took the compression wraps off 1 to 2 days ago to take [name] shower and did not have compression wraps replaced. She reports increased blistering throughout her legs bilaterally. She currently denies signs of infection. 1/26; patient presents for follow-up. She has no issues or complaints today. She tolerated the compression wraps well. She has not heard from oncology to schedule an appointment. 2/2; patient presents for follow-up. She has no issues or complaints today. She continues to tolerate the compression wrap well. 2/16; patient presents for  follow-up. She has no issues or complaints today. 2/23; patient presents for follow-up. She has no issues or complaints today. Tomorrow she has an appointment with oncology to look at the suspicious lesion on her right lower extremity. She currently denies signs of infection. 3/2; patient presents for follow-up. She has been using her juxta light compression to the right lower extremity however there has been increased swelling and opening of wounds throughout the legs with weeping. She currently denies signs of infection. She had no issues with the compression wrap to the left lower extremity. 3/16; patient presents for follow-up. She has no issues or complaints today. She states that she has been restarted on infusions for her squamous cell carcinoma of her legs. She has also been increased on Lasix to 40 mg twice daily. She has noticed [name] decrease in swelling to her legs. 3/23; patient presents for follow-up. She starts her second infusion next week for her squamous cell carcinoma lesions to her legs. She reports no issues with the compression wraps. 3/30; patient presents for follow-up. She has no issues or  complaints today. 4/6; patient presents for follow-up. Her left lower extremity wounds have healed. She has her juxta light compression with her today. She has no issues or complaints today. 4/13; this is [name] patient we actually discharged to juxta lite stockings on the left leg the last time she was here. She came in on Monday for [place] to look at the leg and [name] nurse visit. She had massive swelling and numerous blisters and wound reopening. We put her back in 3 layer compression although I did not generate [name] note. Silver alginate on the wounded areas. We have also been doing the same on the right. In the meantime she is obtained her external compression pumps that were previously ordered and she started to use them. She has skin cancers that are obvious on the right leg x2 her appointment with Dr.  Jarome Matin of dermatology is on May 5.. She is also receiving weekly chemotherapy for recurrent presumably squamous cell carcinomas apparently has had 2 treatments 4/20; patient presents for follow-up. She has her new juxta lite compressions today. She continues to have open wounds to the left lower extremity. She has follow-up with dermatology in 2 weeks. She continues to have IV infusions for her squamous cell carcinoma lesions on her legs. 4/28; the patient has bilateral lower extremity wounds predominantly on the right lateral upper leg, right anterior lower leg which in itself is probably [name] recurrent cancer as well as the left lateral lower leg. She has recurrent squamous cell carcinoma currently being treated with an IV infusion. She also has scattered nodules on her upper lower legs and thighs I am not certain of all of this is felt to be malignant as well We have been using silver alginate on the open areas wrapping her legs. She also has her external compression pumps at home but I am not clear that she is going to use these 5/2; patient presents for follow-up. She has not been taking her diuretics as prescribed. She sees Dr. Dian Situ, dermatology at the end of the week. She tolerated GISELL, BUEHRLE [name] ([id number]) [id number] Page 8 of 13 the compression wraps well today. She has her juxta lite compressions. 5/8; patient presents for follow-up. She saw Dr. Dian Situ, dermatology and she has nothing to report on. She has been using her juxta lite compression to the right lower extremity. She has tolerated the compression wrap well in the left lower extremity. She has not been taking her diuretic. She is scheduled to continue her infusions for her squamous cell carcinoma on her legs. 5/18; patient presents for follow-up. We wrapped her in 3 layer compression at last clinic visit. She has no issues or complaints today. 5/22; patient presents for follow-up. She has been wrapped  in 3 layer compression bilaterally to her lower extremities over the past week. She has been scratching at the top of the wrap and picking at her skin. This area has increased warmth and erythema. 5/30; patient presents for follow-up. She completed Keflex with resolution of her symptoms to the right lower extremity. She no longer has increased warmth or erythema. She continues her infusions for her squamous cell carcinoma lesions. We continue to wrap her in 3 layer compression with silver alginate. She currently denies signs of infection. 6/8; the patient went to see Dr. Ronnald Ramp of dermatology 2 days ago unfortunately we do not have his notes. They removed her compression wraps of course but did not adequately replace them. She comes in today with 3 wounds  on the right lateral upper calf 1 medially below the obvious squamous cell carcinoma there is [name] large necrotic wound on the left lateral tibial area on the left and several blisters. The patient remains on Cemiplimad infusions for the squamous cell carcinoma bilaterally. She is not felt to be [name] good candidate for radiation. Some of the skin changes superiorly in the upper legs bilaterally according to Dr. Ronnald Ramp related to these infusions the same like fleshy nodules to me not blisters 6/13; patient presents for follow-up. We have been using silver alginate under 3 layer compression bilaterally. She has no issues or complaints today. 6/19; patient presents for follow-up. She will see her oncologist in 3 days and next week her dermatologist. She has no new complaints today. We are using silver alginate under 3 layer compression bilaterally. 6/29; patient presents for follow-up. She she saw Dr. Darrin Nipper last week and States she received injections to the lesions on her legs. We will ask for office visit records. She could not be wrapped with compression and has more drainage on exam today. 7/6; patient presents for follow-up. She continues to receive  injections to the lesions in her legs by her dermatologist. She had this done earlier today. She has no issues or complaints today. 7/13; patient presents for follow-up. She is following with Dr.Kavuri once weekly for treatment of her SCCs/KAs with IL[id number]. We have been wrapping her with silver alginate under Kerlix/Coban. She has no issues or complaints today. 7/20; patient presents for follow-up. She has elected to stop doing weekly treatments for her squamous cell carcinoma lesions. She states she experiences too much pain from the injections. She is continuing her immunotherapy infusions. Next 1 is scheduled in August. T the legs we have been using silver alginate o under Kerlix/Coban. Her wounds actually look better today. 7/27; patient presents for follow-up. We have been using Medihoney and silver alginate under compression therapy. She has no issues or complaints today. 8/1; patient presents for follow-up. We have been using Medihoney and silver alginate under compression therapy. She reports no issues. She is scheduled for her infusion for her SCC later this week. 8/22; patient presents for follow-up. We have been using Medihoney and silver alginate under compression therapy. She reports no issues today. She has moved back home from the skilled nursing facility. She has home health that will be coming out to do OT and PT She would like for them to also do wound . care. 9/18; patient presents for follow-up. We have been using Medihoney and silver alginate under compression therapy. Home health is no longer coming out for wound care. She comes in weekly for wrap changes in our office During nurse visits. 10/2; patient presents for follow-up. We have been using silver alginate under compression therapy. She has no issues or complaints today. She states she is doing her last chemoinfusion next week. 10/23; Patient presents for follow-up. We continue to use silver alginate under compression  therapy. She has [name] few more wounds present today. She states she is taking her lasix as prescribed. She has completed her infusions for her SCC lesions. 11/16; patient presents for follow-up. At last clinic visit she had more drainage and irritation and I recommended adding gentamicin and mupirocin with ketoconazole under the dressing and wrap. T oday her wounds have improved in appearance and drainage. She has no issues or complaints today. Patient History Information obtained from Patient. Family History Unknown History. Social History Never smoker, Marital Status - Single, Alcohol Use -  Never, Drug Use - No History, Caffeine Use - Never. Medical History Eyes Denies history of Cataracts, Optic Neuritis Ear/Nose/Mouth/Throat Denies history of Chronic sinus problems/congestion, Middle ear problems Hematologic/Lymphatic Patient has history of Anemia Denies history of Hemophilia, Human Immunodeficiency Virus, Lymphedema, Sickle Cell Disease Respiratory Denies history of Aspiration, Asthma, Chronic Obstructive Pulmonary Disease (COPD), Pneumothorax, Sleep Apnea, Tuberculosis Cardiovascular Patient has history of Arrhythmia - Atrial Flutter, [name] fibb, Congestive Heart Failure, Hypertension Denies history of Angina, Coronary Artery Disease, Deep Vein Thrombosis, Hypotension, Myocardial Infarction, Peripheral Arterial Disease, Peripheral Venous Disease, Phlebitis, Vasculitis Gastrointestinal Patient has history of Colitis Denies history of Cirrhosis , Crohnoos, Hepatitis [name], Hepatitis B, Hepatitis C Endocrine Denies history of Type I Diabetes, Type II Diabetes Genitourinary Denies history of End Stage Renal Disease Babin, Wyatt [name] ([id number]) [id number] Page 9 of 13 Immunological Denies history of Lupus Erythematosus, Raynaudoos, Scleroderma Integumentary (Skin) Denies history of History of Burn Musculoskeletal Patient has history of Osteoarthritis Denies  history of Gout, Rheumatoid Arthritis, Osteomyelitis Neurologic Denies history of Dementia, Neuropathy, Quadriplegia, Paraplegia, Seizure Disorder Oncologic Denies history of Received Chemotherapy, Received Radiation Hospitalization/Surgery History - removal of rod left leg. 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 Vitals Time Taken: 1:52 PM, Height: 65 in, Weight: 163 lbs, BMI: 27.1, Temperature: 98.2 F, Pulse: 67 bpm, Respiratory Rate: 18 breaths/min, Blood Pressure: 133/64 mmHg. Cardiovascular 2+ dorsalis pedis/posterior tibialis pulses. Psychiatric pleasant and cooperative. General Notes: Multiple scattered open wounds to her lower extremities bilaterally With granulation tissue and scant nonviable tissue. No surrounding signs of infection. Adequate edema control. Lymphedema skin changes. Integumentary (Hair, Skin) Wound #20 status is Open. Original cause of wound was Gradually Appeared. The date acquired was: 06/25/2021. The wound has been in treatment 23 weeks. The wound is located on the Right,Circumferential Lower Leg. The wound measures 30cm length x 14cm width x 0.1cm depth; 329.867cm^2 area and 32.987cm^3 volume. There is Fat Layer (Subcutaneous Tissue) exposed. There is no tunneling or undermining noted. There is [name] large amount of serosanguineous drainage noted. Foul odor after cleansing was noted. The wound margin is distinct with the outline attached to the wound base. There is large (67-100%) red, pink granulation within the wound bed. There is [name] small (1-33%) amount of necrotic tissue within the wound bed including Adherent Slough. The periwound skin appearance exhibited: Maceration, Hemosiderin Staining. The periwound skin appearance did not exhibit:  Callus, Crepitus, Excoriation, Induration, Rash, Scarring, Dry/Scaly, Atrophie Blanche, Cyanosis, Ecchymosis, Mottled, Pallor, Rubor, Erythema. Periwound temperature was noted as No Abnormality. The periwound has tenderness on palpation. Wound #7RR status is Open. Original cause of wound was Blister. The date acquired was: 04/20/2020. The wound has been in treatment 77 weeks. The wound is located on the Left,Circumferential Lower Leg. The wound measures 17cm length x 22cm width x 0.1cm depth; 293.739cm^2 area and 29.374cm^3 volume. There is Fat Layer (Subcutaneous Tissue) exposed. There is no tunneling or undermining noted. There is [name] large amount of serosanguineous drainage noted. Foul odor after cleansing was noted. The wound margin is distinct with the outline attached to the wound base. There is small (1-33%) red, pink granulation within the wound bed. There is [name] large (67-100%) amount of necrotic tissue within the wound bed including Eschar and Adherent Slough. The periwound skin appearance exhibited: Maceration, Hemosiderin Staining. The periwound skin appearance did not exhibit: Callus, Crepitus, Excoriation, Induration, Rash, Scarring,  Dry/Scaly, Atrophie Blanche, Cyanosis, Ecchymosis, Mottled, Pallor, Rubor, Erythema. Periwound temperature was noted as No Abnormality. Assessment Active Problems ICD-10 Squamous cell carcinoma of skin, unspecified Non-pressure chronic ulcer of other part of right lower leg with fat layer exposed Non-pressure chronic ulcer of other part of left lower leg with fat layer exposed Lymphedema, not elsewhere classified Chronic venous hypertension (idiopathic) with ulcer of bilateral lower extremity Lacomb, Kaula [name] ([id number]) [id number] Page 10 of 13 Patient's wounds are stable overall. No signs of surrounding infection. I recommended continuing the course with antibiotic ointment and ketoconazole with silver alginate under 3 layer  compression bilaterally to the lower extremities. This is [name] palliative care wound care case. Procedures Wound #20 Pre-procedure diagnosis of Wound #20 is [name] Lymphedema located on the Right,Circumferential Lower Leg . There was [name] Three Layer Compression Therapy Procedure by Rhae Hammock, RN. Post procedure Diagnosis Wound #20: Same as Pre-Procedure Wound #7RR Pre-procedure diagnosis of Wound #7RR is [name] Malignant Wound located on the Left,Circumferential Lower Leg . There was [name] Three Layer Compression Therapy Procedure by Rhae Hammock, RN. Post procedure Diagnosis Wound #7RR: Same as Pre-Procedure Plan Follow-up Appointments: Return appointment in 3 weeks. - Dr. Heber Halls Nurse Visit: - Weekly and every 3 weeks see Dr. Heber Malcolm. Bathing/ Shower/ Hygiene: May shower with protection but do not get wound dressing(s) wet. May shower and wash wound with soap and water. - with dressing changes may wash wounds with soap and water. Edema Control - Lymphedema / SCD / Other: Lymphedema Pumps. Use Lymphedema pumps on leg(s) 2-3 times [name] day for 45-60 minutes. If wearing any wraps or hose, do not remove them. Continue exercising as instructed. - use one hour each time twice [name] day. Elevate legs to the level of the heart or above for 30 minutes daily and/or when sitting, [name] frequency of: - elevate the legs throughout the day heart level if possible. Avoid standing for long periods of time. Exercise regularly Additional Orders / Instructions: Follow Nutritious Diet WOUND #20: - Lower Leg Wound Laterality: Right, Circumferential Cleanser: Soap and Water 1 x Per Week/30 Days Discharge Instructions: May shower and wash wound with dial antibacterial soap and water prior to dressing change. Cleanser: Wound Cleanser 1 x Per Week/30 Days Discharge Instructions: Cleanse the wound with wound cleanser prior to applying [name] clean dressing using gauze sponges, not tissue or cotton balls. Peri-Wound Care: cetaphil  lotion 1 x Per Week/30 Days Discharge Instructions: patient to provide to nurses to apply to both legs. Topical: Gentamicin 1 x Per Week/30 Days Discharge Instructions: As directed by physician Topical: Mupirocin Ointment 1 x Per Week/30 Days Discharge Instructions: Apply Mupirocin (Bactroban) as instructed Prim Dressing: KerraCel Ag Gelling Fiber Dressing, 4x5 in (silver alginate) 1 x Per Week/30 Days ary Discharge Instructions: Apply silver alginate to wound bed as instructed Secondary Dressing: ABD Pad, 8x10 1 x Per Week/30 Days Discharge Instructions: Apply over primary dressing as directed. Com pression Wrap: ThreePress (3 layer compression wrap) 1 x Per Week/30 Days Discharge Instructions: Apply three layer compression as directed. ***PLEASE USE KERLIX INSTEAD OF COTTON.*** WOUND #7RR: - Lower Leg Wound Laterality: Left, Circumferential Cleanser: Soap and Water 1 x Per Week/30 Days Discharge Instructions: May shower and wash wound with dial antibacterial soap and water prior to dressing change. Cleanser: Wound Cleanser 1 x Per Week/30 Days Discharge Instructions: Cleanse the wound with wound cleanser prior to applying [name] clean dressing using gauze sponges, not tissue or cotton balls. Peri-Wound Care: cetaphil lotion 1 x  Per Week/30 Days Discharge Instructions: patient to provide to nurses to apply to both legs. Topical: Gentamicin 1 x Per Week/30 Days Discharge Instructions: As directed by physician Topical: Mupirocin Ointment 1 x Per Week/30 Days Discharge Instructions: Apply Mupirocin (Bactroban) as instructed Prim Dressing: KerraCel Ag Gelling Fiber Dressing, 4x5 in (silver alginate) 1 x Per Week/30 Days ary Discharge Instructions: Apply silver alginate to wound bed as instructed Secondary Dressing: ABD Pad, 8x10 1 x Per Week/30 Days Discharge Instructions: Apply over primary dressing as directed. Com pression Wrap: ThreePress (3 layer compression wrap) 1 x Per Week/30  Days Discharge Instructions: Apply three layer compression as directed. ***PLEASE USE KERLIX INSTEAD OF COTTON.*** 1. Silver alginate with antibiotic ointment under 3 layer compression to the lower extremities bilaterally 2. Follow-up Weekly nurse visits 3. Follow-up in 1 month for physician visit Electronic Signature(s) Signed: 12/05/2021 5:05:23 PM By: Kalman Shan DO Entered By: Kalman Shan on 12/05/2021 14:55:19 [name] [name], [name] [name] ([id number]) [id number] Page 11 of 13 -------------------------------------------------------------------------------- HxROS Details Patient Name: Date of Service: [name] [name], [place]. 12/05/2021 1:30 PM Medical Record Number: [id number] Patient Account Number: [id number] Date of Birth/Sex: Treating RN: 08/20/1925 (88 y.o. F) Primary Care Provider: [name] Other Clinician: Referring Provider: Treating Provider/Extender: [name] in Treatment: 65 Information Obtained From Patient Eyes Medical History: Negative for: Cataracts; Optic Neuritis Ear/Nose/Mouth/Throat Medical History: Negative for: Chronic sinus problems/congestion; Middle ear problems Hematologic/Lymphatic Medical History: Positive for: Anemia Negative for: Hemophilia; Human Immunodeficiency Virus; Lymphedema; Sickle Cell Disease Respiratory Medical History: Negative for: Aspiration; Asthma; Chronic Obstructive Pulmonary Disease (COPD); Pneumothorax; Sleep Apnea; Tuberculosis Cardiovascular Medical History: Positive for: Arrhythmia - Atrial Flutter, [name] fibb; Congestive Heart Failure; Hypertension Negative for: Angina; Coronary Artery Disease; Deep Vein Thrombosis; Hypotension; Myocardial Infarction; Peripheral Arterial Disease; Peripheral Venous Disease; Phlebitis; Vasculitis Past Medical History Notes: hyperlipidemia Gastrointestinal Medical History: Positive for: Colitis Negative for: Cirrhosis ;  Crohns; Hepatitis [name]; Hepatitis B; Hepatitis C Endocrine Medical History: Negative for: Type I Diabetes; Type II Diabetes Past Medical History Notes: hypothyroidism Genitourinary Medical History: Negative for: End Stage Renal Disease Immunological Medical History: Negative for: Lupus Erythematosus; Raynauds; Scleroderma Integumentary (Skin) Medical History: Negative for: History of Burn Musculoskeletal Medical History: KYLIEANN, [name] [name] ([id number]) [id number] Page 12 of 13 Positive for: Osteoarthritis Negative for: Gout; Rheumatoid Arthritis; Osteomyelitis Neurologic Medical History: Negative for: Dementia; Neuropathy; Quadriplegia; Paraplegia; Seizure Disorder Past Medical History Notes: lumbar spindylolysis Oncologic Medical History: Negative for: Received Chemotherapy; Received Radiation Past Medical History Notes: BLE squamous ceel carcionoma Immunizations Pneumococcal Vaccine: Received Pneumococcal Vaccination: Yes Received Pneumococcal Vaccination On or After 60th Birthday: No Implantable Devices None Hospitalization / Surgery History Type of Hospitalization/Surgery removal of rod left leg Family and Social History Unknown History: Yes; Never smoker; Marital Status - Single; Alcohol Use: Never; Drug Use: No History; Caffeine Use: Never; Financial Concerns: No; Food, Clothing or Shelter Needs: No; Support System Lacking: No; Transportation Concerns: No Electronic Signature(s) Signed: 12/05/2021 5:05:23 PM By: Kalman Shan DO Entered By: Kalman Shan on 12/05/2021 14:52:14 -------------------------------------------------------------------------------- SuperBill Details Patient Name: Date of Service: [name] [name], [place]. 12/05/2021 Medical Record Number: [id number] Patient Account Number: [id number] Date of Birth/Sex: Treating RN: 1925/12/05 (88 y.o. [name], [name] Primary Care Provider: [name] Other  Clinician: Referring Provider: Treating Provider/Extender: [name] in Treatment: 75 Diagnosis Coding ICD-10 Codes Code Description C44.92 Squamous cell carcinoma of skin, unspecified L97.812 Non-pressure chronic ulcer of other part of right lower leg with  fat layer exposed L97.822 Non-pressure chronic ulcer of other part of left lower leg with fat layer exposed I89.0 Lymphedema, not elsewhere classified I87.313 Chronic venous hypertension (idiopathic) with ulcer of bilateral lower extremity Facility Procedures : CPT4: Code [id number] Description: 81 BILATERAL: Application of multi-layer venous compression system; leg (below knee), including ankle and t. Modifier: Quantity: 1 Physician Procedures KEEGAN, BENSCH ([id number]): CPT4 Code Description [id number] - WC PHYS LEVEL 3 - EST PT ICD-10 Diagnosis Description C44.92 Squamous cell carcinoma of skin, unspecified L97.812 Non-pressure chronic ulcer of other part of right lower leg wit L97.822  Non-pressure chronic ulcer of other part of left lower leg with I87.313 Chronic venous hypertension (idiopathic) with ulcer of bilatera [id number] Page 13 of 13: Quantity Modifier 1 h fat layer exposed fat layer exposed l lower extremity Electronic Signature(s) Signed: 12/05/2021 5:05:23 PM By: Kalman Shan DO Entered By: Kalman Shan on 12/05/2021 14:55:52

## 2021-12-09 ENCOUNTER — Telehealth: Payer: Self-pay | Admitting: Oncology

## 2021-12-09 ENCOUNTER — Other Ambulatory Visit: Payer: Self-pay | Admitting: Internal Medicine

## 2021-12-09 NOTE — Telephone Encounter (Signed)
Called patient regarding providers departure, rescheduled April appointment with new provider. Patient's daughter is notified and will inform her mother.

## 2021-12-10 ENCOUNTER — Encounter (HOSPITAL_BASED_OUTPATIENT_CLINIC_OR_DEPARTMENT_OTHER): Payer: Medicare Other | Admitting: Internal Medicine

## 2021-12-10 DIAGNOSIS — Z85828 Personal history of other malignant neoplasm of skin: Secondary | ICD-10-CM | POA: Diagnosis not present

## 2021-12-10 DIAGNOSIS — L97812 Non-pressure chronic ulcer of other part of right lower leg with fat layer exposed: Secondary | ICD-10-CM | POA: Diagnosis not present

## 2021-12-10 DIAGNOSIS — I890 Lymphedema, not elsewhere classified: Secondary | ICD-10-CM | POA: Diagnosis not present

## 2021-12-10 DIAGNOSIS — L97822 Non-pressure chronic ulcer of other part of left lower leg with fat layer exposed: Secondary | ICD-10-CM | POA: Diagnosis not present

## 2021-12-10 DIAGNOSIS — I87313 Chronic venous hypertension (idiopathic) with ulcer of bilateral lower extremity: Secondary | ICD-10-CM | POA: Diagnosis not present

## 2021-12-10 DIAGNOSIS — C4492 Squamous cell carcinoma of skin, unspecified: Secondary | ICD-10-CM | POA: Diagnosis not present

## 2021-12-10 NOTE — Progress Notes (Signed)
[name], PIZZIMENTI Bell ([id number]) [id number] Page 1 of 6 Visit Report for 12/10/2021 Arrival Information Details Patient Name: Date of Service: [name], [name]. 12/10/2021 1:00 PM Medical Record Number: [id number] Patient Account Number: [id number] Date of Birth/Sex: Treating RN: November 11, 1925 (88 y.o. F) Primary Care Dailan Pfalzgraf: [name] Other Clinician: Referring Chevon Fomby: Treating Lyrik Buresh/Extender: [name] in Treatment: 50 Visit Information History Since Last Visit Added or deleted any medications: No Patient Arrived: [place] Any new allergies or adverse reactions: No Arrival Time: 13:32 Had Bell fall or experienced change in No Accompanied By: self activities of daily living that may affect Transfer Assistance: None risk of falls: Patient Identification Verified: Yes Signs or symptoms of abuse/neglect since last visito No Secondary Verification Process Completed: Yes Hospitalized since last visit: No Patient Requires Transmission-Based No Implantable device outside of the clinic excluding No Precautions: cellular tissue based products placed in the center Patient Has Alerts: Yes since last visit: Patient Alerts: R ABI = Non compressible Has Dressing in Place as Prescribed: Yes L ABI = Non Has Compression in Place as Prescribed: Yes compressible Pain Present Now: No Notes Drainage excessive through dressings to both the bilateral lower legs wounds. Spoke with Jennalynn Rivard, Dr. Heber [place]. She ordered home health twice Bell week due to the excessive drainage and change dressings. Per Dr. Heber [place]inson to see wound care orders. Electronic Signature(s) Signed: 12/11/2021 10:42:33 AM By: Deon Pilling RN, BSN Previous Signature: 12/10/2021 4:33:14 PM Version By: Erenest Blank Entered By: Deon Pilling on 12/11/2021 10:42:33 -------------------------------------------------------------------------------- Compression Therapy  Details Patient Name: Date of Service: [name], [name]. 12/10/2021 1:00 PM Medical Record Number: [id number] Patient Account Number: [id number] Date of Birth/Sex: Treating RN: 1925/09/05 (88 y.o. F) Primary Care Hannan Hutmacher: [name] Other Clinician: Referring Dera Vanaken: Treating Iyah Laguna/Extender: [name] in Treatment: 26 Compression Therapy Performed for Wound Assessment: Wound #20 Right,Circumferential Lower Leg Performed By: Clinician Erenest Blank, Compression Type: Three Layer Electronic Signature(s) Signed: 12/10/2021 4:33:14 PM By: Erenest Blank Entered By: Erenest Blank on 12/10/2021 16:29:27 [name] ([id number]) [id number] Page 2 of 6 -------------------------------------------------------------------------------- Compression Therapy Details Patient Name: Date of Service: [name], [name]. 12/10/2021 1:00 PM Medical Record Number: [id number] Patient Account Number: [id number] Date of Birth/Sex: Treating RN: 11/12/1925 (88 y.o. F) Primary Care Jaxsen Bernhart: [name] Other Clinician: Referring Miraya Cudney: Treating Scout Guyett/Extender: [name] in Treatment: 62 Compression Therapy Performed for Wound Assessment: Wound #7RR Left,Circumferential Lower Leg Performed By: Clinician Erenest Blank, Compression Type: Three Layer Electronic Signature(s) Signed: 12/10/2021 4:33:14 PM By: Erenest Blank Entered By: Erenest Blank on 12/10/2021 16:29:27 -------------------------------------------------------------------------------- Encounter Discharge Information Details Patient Name: Date of Service: [name], [name]. 12/10/2021 1:00 PM Medical Record Number: [id number] Patient Account Number: [id number] Date of Birth/Sex: Treating RN: 1926/01/13 (88 y.o. F) Primary Care Airelle Everding: [name] Other Clinician: Erenest Blank Referring Zelphia Glover: Treating  Zemirah Krasinski/Extender: [name] in Treatment: 41 Encounter Discharge Information Items Discharge Condition: Stable Ambulatory Status: Ambulatory Discharge Destination: Home Transportation: Private Auto Accompanied By: self Schedule Follow-up Appointment: Yes Clinical Summary of Care: Electronic Signature(s) Signed: 12/10/2021 4:33:14 PM By: Erenest Blank Entered By: Erenest Blank on 12/10/2021 16:30:22 -------------------------------------------------------------------------------- Patient/Caregiver Education Details Patient Name: Date of Service: [name], [name]. 11/21/2023andnbsp1:00 PM Medical Record Number: [id number] Patient Account Number: [id number] Date of Birth/Gender: Treating RN: 16-Sep-1925 (88 y.o. F) Primary Care Physician: [name] Other Clinician: Erenest Blank Referring Physician: Treating Physician/Extender:  Heber Grapeland, Jessica [name], [name] Weeks in Treatment: 39 Education Assessment Education Provided To: Patient Education Topics Provided [employer]) THEODOSIA, BAHENA Bell ([id number]) [id number] Page 3 of 6 Signed: 12/10/2021 4:33:14 PM By: Erenest Blank Entered By: Erenest Blank on 12/10/2021 16:30:00 -------------------------------------------------------------------------------- Wound Assessment Details Patient Name: Date of Service: [name], [name]. 12/10/2021 1:00 PM Medical Record Number: [id number] Patient Account Number: [id number] Date of Birth/Sex: Treating RN: Oct 08, 1925 (88 y.o. F) Primary Care Kerrilyn Azbill: [name] Other Clinician: Referring Kemon Devincenzi: Treating Siara Gorder/Extender: [name] in Treatment: 74 Wound Status Wound Number: 20 Primary Lymphedema Etiology: Wound Location: Right, Circumferential Lower Leg Wound Open Wounding Event: Gradually Appeared Status: Date Acquired: 06/25/2021 Comorbid Anemia,  Arrhythmia, Congestive Heart Failure, Hypertension, Weeks Of Treatment: 23 History: Colitis, Osteoarthritis Clustered Wound: Yes Wound Measurements Length: (cm) 30 Width: (cm) 14 Depth: (cm) 0.1 Area: (cm) 329.867 Volume: (cm) 32.987 % Reduction in Area: 17% % Reduction in Volume: 17% Epithelialization: None Tunneling: No Undermining: No Wound Description Classification: Full Thickness Without Exposed Support Structures Wound Margin: Distinct, outline attached Exudate Amount: Large Exudate Type: Serosanguineous Exudate Color: red, brown Foul Odor After Cleansing: Yes Due to Product Use: No Slough/Fibrino Yes Wound Bed Granulation Amount: Large (67-100%) Exposed Structure Granulation Quality: Red, Pink Fascia Exposed: No Necrotic Amount: Small (1-33%) Fat Layer (Subcutaneous Tissue) Exposed: Yes Necrotic Quality: Adherent Slough Tendon Exposed: No Muscle Exposed: No Joint Exposed: No Bone Exposed: No Periwound Skin Texture Texture Color No Abnormalities Noted: No No Abnormalities Noted: No Callus: No Atrophie Blanche: No Crepitus: No Cyanosis: No Excoriation: No Ecchymosis: No Induration: No Erythema: No Rash: No Hemosiderin Staining: Yes Scarring: No Mottled: No Pallor: No Moisture Rubor: No No Abnormalities Noted: No Dry / Scaly: No Temperature / Pain Maceration: Yes Temperature: No Abnormality Tenderness on Palpation: Yes Treatment Notes Wound #20 (Lower Leg) Wound Laterality: Right, Circumferential Cleanser Soap and Water Discharge Instruction: May shower and wash wound with dial antibacterial soap and water prior to dressing change. AMA, MCMASTER Bell ([id number]) [id number] Page 4 of 6 Wound Cleanser Discharge Instruction: Cleanse the wound with wound cleanser prior to applying Bell clean dressing using gauze sponges, not tissue or cotton balls. Peri-Wound Care cetaphil lotion Discharge Instruction: patient to provide to  nurses to apply to both legs. Topical Gentamicin Discharge Instruction: As directed by physician Mupirocin Ointment Discharge Instruction: Apply Mupirocin (Bactroban) as instructed Primary Dressing KerraCel Ag Gelling Fiber Dressing, 4x5 in (silver alginate) Discharge Instruction: Apply silver alginate to wound bed as instructed Secondary Dressing ABD Pad, 8x10 Discharge Instruction: Apply over primary dressing as directed. Secured With Compression Wrap ThreePress (3 layer compression wrap) Discharge Instruction: Apply three layer compression as directed. ***PLEASE USE KERLIX INSTEAD OF COTTON.*** Compression Stockings Add-Ons Electronic Signature(s) Signed: 12/11/2021 10:43:44 AM By: Deon Pilling RN, BSN Previous Signature: 12/10/2021 4:33:14 PM Version By: Erenest Blank Entered By: Deon Pilling on 12/11/2021 10:43:03 -------------------------------------------------------------------------------- Wound Assessment Details Patient Name: Date of Service: [name], [name]. 12/10/2021 1:00 PM Medical Record Number: [id number] Patient Account Number: [id number] Date of Birth/Sex: Treating RN: 1925-04-25 (88 y.o. F) Primary Care Landis Cassaro: [name] Other Clinician: Referring Zyra Parrillo: Treating Davidjames Blansett/Extender: [name] in Treatment: 9 Wound Status Wound Number: 7RR Primary Malignant Wound Etiology: Wound Location: Left, Circumferential Lower Leg Wound Open Wounding Event: Blister Status: Date Acquired: 04/20/2020 Comorbid Anemia, Arrhythmia, Congestive Heart Failure, Hypertension, Weeks Of Treatment: 78 History: Colitis, Osteoarthritis Clustered Wound: Yes Wound Measurements Length: (cm) 17 Width: (cm)  22 Depth: (cm) 0.1 Area: (cm) 293.739 Volume: (cm) 29.374 % Reduction in Area: -9464.9% % Reduction in Volume: -9468.1% Epithelialization: None Tunneling: No Undermining: No Wound Description Classification: Full  Thickness Without Exposed Support Structures Wound Margin: Distinct, outline attached Exudate Amount: Large Exudate Type: Serosanguineous Exudate Color: red, brown Jaskulski, [name] ([id number]) Foul Odor After Cleansing: Yes Due to Product Use: No Slough/Fibrino Yes [id number] Page 5 of 6 Wound Bed Granulation Amount: Small (1-33%) Exposed Structure Granulation Quality: Red, Pink Fascia Exposed: No Necrotic Amount: Large (67-100%) Fat Layer (Subcutaneous Tissue) Exposed: Yes Necrotic Quality: Adherent Slough Tendon Exposed: No Muscle Exposed: No Joint Exposed: No Bone Exposed: No Periwound Skin Texture Texture Color No Abnormalities Noted: No No Abnormalities Noted: No Callus: No Atrophie Blanche: No Crepitus: No Cyanosis: No Excoriation: No Ecchymosis: No Induration: No Erythema: No Rash: No Hemosiderin Staining: Yes Scarring: No Mottled: No Pallor: No Moisture Rubor: No No Abnormalities Noted: No Dry / Scaly: No Temperature / Pain Maceration: Yes Temperature: No Abnormality Treatment Notes Wound #7RR (Lower Leg) Wound Laterality: Left, Circumferential Cleanser Soap and Water Discharge Instruction: May shower and wash wound with dial antibacterial soap and water prior to dressing change. Wound Cleanser Discharge Instruction: Cleanse the wound with wound cleanser prior to applying Bell clean dressing using gauze sponges, not tissue or cotton balls. Peri-Wound Care cetaphil lotion Discharge Instruction: patient to provide to nurses to apply to both legs. Topical Gentamicin Discharge Instruction: As directed by physician Mupirocin Ointment Discharge Instruction: Apply Mupirocin (Bactroban) as instructed Primary Dressing KerraCel Ag Gelling Fiber Dressing, 4x5 in (silver alginate) Discharge Instruction: Apply silver alginate to wound bed as instructed Secondary Dressing ABD Pad, 8x10 Discharge Instruction: Apply over primary dressing  as directed. Secured With Compression Wrap ThreePress (3 layer compression wrap) Discharge Instruction: Apply three layer compression as directed. ***PLEASE USE KERLIX INSTEAD OF COTTON.*** Compression Stockings Add-Ons Electronic Signature(s) Signed: 12/11/2021 10:43:44 AM By: Deon Pilling RN, BSN Previous Signature: 12/10/2021 4:33:14 PM Version By: Erenest Blank Entered By: Deon Pilling on 12/11/2021 10:43:21 Gar[place], [name] ([id number]) [id number] Page 6 of 6 -------------------------------------------------------------------------------- Vitals Details Patient Name: Date of Service: [name], [name]. 12/10/2021 1:00 PM Medical Record Number: [id number] Patient Account Number: [id number] Date of Birth/Sex: Treating RN: July 16, 1925 (88 y.o. F) Primary Care Aydden Cumpian: [name] Other Clinician: Referring Rilyn Scroggs: Treating Krystofer Hevener/Extender: [name] in Treatment: 19 Vital Signs Time Taken: 13:32 Temperature (F): 97.7 Height (in): 65 Pulse (bpm): 74 Weight (lbs): 163 Respiratory Rate (breaths/min): 17 Body Mass Index (BMI): 27.1 Blood Pressure (mmHg): 107/67 Reference Range: 80 - 120 mg / dl Electronic Signature(s) Signed: 12/10/2021 4:33:14 PM By: Erenest Blank Entered By: Erenest Blank on 12/10/2021 13:33:14

## 2021-12-11 NOTE — Progress Notes (Signed)
RALPH, BENAVIDEZ A ([id number]) [id number] Page 1 of 1 Visit Report for 12/10/2021 SuperBill Details Patient Name: Date of Service: [name], [place]. 12/10/2021 Medical Record Number: [id number] Patient Account Number: [id number] Date of Birth/Sex: Treating RN: Mar 18, 1925 (88 y.o. F) Primary Care Provider: [name] Other Clinician: Referring Provider: Treating Provider/Extender: [name] in Treatment: 81 Diagnosis Coding ICD-10 Codes Code Description C44.92 Squamous cell carcinoma of skin, unspecified L97.812 Non-pressure chronic ulcer of other part of right lower leg with fat layer exposed L97.822 Non-pressure chronic ulcer of other part of left lower leg with fat layer exposed I89.0 Lymphedema, not elsewhere classified I87.313 Chronic venous hypertension (idiopathic) with ulcer of bilateral lower extremity Facility Procedures CPT4 Description Modifier Quantity Code [id number] BILATERAL: Application of multi-layer venous compression system; leg (below knee), including ankle and 1 foot. Electronic Signature(s) Signed: 12/10/2021 4:33:14 PM By: Erenest Blank Signed: 12/11/2021 8:35:44 AM By: Kalman Shan DO Entered By: Erenest Blank on 12/10/2021 16:30:46

## 2021-12-13 DIAGNOSIS — M25512 Pain in left shoulder: Secondary | ICD-10-CM | POA: Diagnosis not present

## 2021-12-13 DIAGNOSIS — K219 Gastro-esophageal reflux disease without esophagitis: Secondary | ICD-10-CM | POA: Diagnosis not present

## 2021-12-13 DIAGNOSIS — M5442 Lumbago with sciatica, left side: Secondary | ICD-10-CM | POA: Diagnosis not present

## 2021-12-13 DIAGNOSIS — E039 Hypothyroidism, unspecified: Secondary | ICD-10-CM | POA: Diagnosis not present

## 2021-12-13 DIAGNOSIS — C44729 Squamous cell carcinoma of skin of left lower limb, including hip: Secondary | ICD-10-CM | POA: Diagnosis not present

## 2021-12-13 DIAGNOSIS — I5032 Chronic diastolic (congestive) heart failure: Secondary | ICD-10-CM | POA: Diagnosis not present

## 2021-12-13 DIAGNOSIS — M19041 Primary osteoarthritis, right hand: Secondary | ICD-10-CM | POA: Diagnosis not present

## 2021-12-13 DIAGNOSIS — C44722 Squamous cell carcinoma of skin of right lower limb, including hip: Secondary | ICD-10-CM | POA: Diagnosis not present

## 2021-12-13 DIAGNOSIS — F4321 Adjustment disorder with depressed mood: Secondary | ICD-10-CM | POA: Diagnosis not present

## 2021-12-13 DIAGNOSIS — I4892 Unspecified atrial flutter: Secondary | ICD-10-CM | POA: Diagnosis not present

## 2021-12-13 DIAGNOSIS — I87313 Chronic venous hypertension (idiopathic) with ulcer of bilateral lower extremity: Secondary | ICD-10-CM | POA: Diagnosis not present

## 2021-12-13 DIAGNOSIS — I890 Lymphedema, not elsewhere classified: Secondary | ICD-10-CM | POA: Diagnosis not present

## 2021-12-13 DIAGNOSIS — M810 Age-related osteoporosis without current pathological fracture: Secondary | ICD-10-CM | POA: Diagnosis not present

## 2021-12-13 DIAGNOSIS — I4819 Other persistent atrial fibrillation: Secondary | ICD-10-CM | POA: Diagnosis not present

## 2021-12-13 DIAGNOSIS — L97812 Non-pressure chronic ulcer of other part of right lower leg with fat layer exposed: Secondary | ICD-10-CM | POA: Diagnosis not present

## 2021-12-13 DIAGNOSIS — M19042 Primary osteoarthritis, left hand: Secondary | ICD-10-CM | POA: Diagnosis not present

## 2021-12-13 DIAGNOSIS — M25511 Pain in right shoulder: Secondary | ICD-10-CM | POA: Diagnosis not present

## 2021-12-13 DIAGNOSIS — N183 Chronic kidney disease, stage 3 unspecified: Secondary | ICD-10-CM | POA: Diagnosis not present

## 2021-12-13 DIAGNOSIS — G8929 Other chronic pain: Secondary | ICD-10-CM | POA: Diagnosis not present

## 2021-12-13 DIAGNOSIS — I130 Hypertensive heart and chronic kidney disease with heart failure and stage 1 through stage 4 chronic kidney disease, or unspecified chronic kidney disease: Secondary | ICD-10-CM | POA: Diagnosis not present

## 2021-12-13 DIAGNOSIS — L97822 Non-pressure chronic ulcer of other part of left lower leg with fat layer exposed: Secondary | ICD-10-CM | POA: Diagnosis not present

## 2021-12-13 DIAGNOSIS — G4700 Insomnia, unspecified: Secondary | ICD-10-CM | POA: Diagnosis not present

## 2021-12-13 DIAGNOSIS — M5441 Lumbago with sciatica, right side: Secondary | ICD-10-CM | POA: Diagnosis not present

## 2021-12-13 DIAGNOSIS — M546 Pain in thoracic spine: Secondary | ICD-10-CM | POA: Diagnosis not present

## 2021-12-13 DIAGNOSIS — L859 Epidermal thickening, unspecified: Secondary | ICD-10-CM | POA: Diagnosis not present

## 2021-12-15 DIAGNOSIS — L97812 Non-pressure chronic ulcer of other part of right lower leg with fat layer exposed: Secondary | ICD-10-CM | POA: Diagnosis not present

## 2021-12-15 DIAGNOSIS — L97822 Non-pressure chronic ulcer of other part of left lower leg with fat layer exposed: Secondary | ICD-10-CM | POA: Diagnosis not present

## 2021-12-15 DIAGNOSIS — C44722 Squamous cell carcinoma of skin of right lower limb, including hip: Secondary | ICD-10-CM | POA: Diagnosis not present

## 2021-12-15 DIAGNOSIS — I87313 Chronic venous hypertension (idiopathic) with ulcer of bilateral lower extremity: Secondary | ICD-10-CM | POA: Diagnosis not present

## 2021-12-15 DIAGNOSIS — C44729 Squamous cell carcinoma of skin of left lower limb, including hip: Secondary | ICD-10-CM | POA: Diagnosis not present

## 2021-12-15 DIAGNOSIS — I130 Hypertensive heart and chronic kidney disease with heart failure and stage 1 through stage 4 chronic kidney disease, or unspecified chronic kidney disease: Secondary | ICD-10-CM | POA: Diagnosis not present

## 2021-12-17 DIAGNOSIS — I87313 Chronic venous hypertension (idiopathic) with ulcer of bilateral lower extremity: Secondary | ICD-10-CM | POA: Diagnosis not present

## 2021-12-17 DIAGNOSIS — L97812 Non-pressure chronic ulcer of other part of right lower leg with fat layer exposed: Secondary | ICD-10-CM | POA: Diagnosis not present

## 2021-12-17 DIAGNOSIS — L97822 Non-pressure chronic ulcer of other part of left lower leg with fat layer exposed: Secondary | ICD-10-CM | POA: Diagnosis not present

## 2021-12-17 DIAGNOSIS — I130 Hypertensive heart and chronic kidney disease with heart failure and stage 1 through stage 4 chronic kidney disease, or unspecified chronic kidney disease: Secondary | ICD-10-CM | POA: Diagnosis not present

## 2021-12-17 DIAGNOSIS — C44729 Squamous cell carcinoma of skin of left lower limb, including hip: Secondary | ICD-10-CM | POA: Diagnosis not present

## 2021-12-17 DIAGNOSIS — C44722 Squamous cell carcinoma of skin of right lower limb, including hip: Secondary | ICD-10-CM | POA: Diagnosis not present

## 2021-12-19 ENCOUNTER — Encounter (HOSPITAL_BASED_OUTPATIENT_CLINIC_OR_DEPARTMENT_OTHER): Payer: Medicare Other | Admitting: Internal Medicine

## 2021-12-19 DIAGNOSIS — L97822 Non-pressure chronic ulcer of other part of left lower leg with fat layer exposed: Secondary | ICD-10-CM | POA: Diagnosis not present

## 2021-12-19 DIAGNOSIS — I87313 Chronic venous hypertension (idiopathic) with ulcer of bilateral lower extremity: Secondary | ICD-10-CM | POA: Diagnosis not present

## 2021-12-19 DIAGNOSIS — Z85828 Personal history of other malignant neoplasm of skin: Secondary | ICD-10-CM | POA: Diagnosis not present

## 2021-12-19 DIAGNOSIS — I890 Lymphedema, not elsewhere classified: Secondary | ICD-10-CM | POA: Diagnosis not present

## 2021-12-19 DIAGNOSIS — C4492 Squamous cell carcinoma of skin, unspecified: Secondary | ICD-10-CM | POA: Diagnosis not present

## 2021-12-19 DIAGNOSIS — L97812 Non-pressure chronic ulcer of other part of right lower leg with fat layer exposed: Secondary | ICD-10-CM | POA: Diagnosis not present

## 2021-12-20 DIAGNOSIS — L97812 Non-pressure chronic ulcer of other part of right lower leg with fat layer exposed: Secondary | ICD-10-CM | POA: Diagnosis not present

## 2021-12-20 DIAGNOSIS — I87313 Chronic venous hypertension (idiopathic) with ulcer of bilateral lower extremity: Secondary | ICD-10-CM | POA: Diagnosis not present

## 2021-12-20 DIAGNOSIS — L97822 Non-pressure chronic ulcer of other part of left lower leg with fat layer exposed: Secondary | ICD-10-CM | POA: Diagnosis not present

## 2021-12-20 DIAGNOSIS — I130 Hypertensive heart and chronic kidney disease with heart failure and stage 1 through stage 4 chronic kidney disease, or unspecified chronic kidney disease: Secondary | ICD-10-CM | POA: Diagnosis not present

## 2021-12-20 DIAGNOSIS — C44729 Squamous cell carcinoma of skin of left lower limb, including hip: Secondary | ICD-10-CM | POA: Diagnosis not present

## 2021-12-20 DIAGNOSIS — C44722 Squamous cell carcinoma of skin of right lower limb, including hip: Secondary | ICD-10-CM | POA: Diagnosis not present

## 2021-12-20 NOTE — Progress Notes (Signed)
ELVY, MCLARTY Bell ([id number]) [id number].pdf Page 1 of 10 Visit Report for 12/19/2021 Arrival Information Details Patient Name: Date of Service: [name], [name] 12/19/2021 1:15 PM Medical Record Number: [id number] Patient Account Number: [id number] Date of Birth/Sex: Treating RN: 02/11/1925 (88 y.o. F) Primary Care [name]: [name] Other Clinician: Referring [name] in Treatment: 52 Visit Information History Since Last Visit Added or deleted any medications: No Patient Arrived: Walker Any new allergies or adverse reactions: No Arrival Time: 13:34 Had Bell fall or experienced change in No Accompanied By: self activities of daily living that may affect Transfer Assistance: None risk of falls: Patient Identification Verified: Yes Signs or symptoms of abuse/neglect since last visito No Secondary Verification Process Completed: Yes Hospitalized since last visit: No Patient Requires Transmission-Based No Implantable device outside of the clinic excluding No Precautions: cellular tissue based products placed in the center Patient Has Alerts: Yes since last visit: Patient Alerts: R ABI = Non compressible Has Compression in Place as Prescribed: Yes L ABI = Non compressible Pain Present Now: No Electronic Signature(s) Signed: 12/20/2021 1:02:55 PM By: Erenest Blank Entered By: Erenest Blank on 12/19/2021 13:34:33 -------------------------------------------------------------------------------- Compression Therapy Details Patient Name: Date of Service: [name], [name]. 12/19/2021 1:15 PM Medical Record Number: [id number] Patient Account Number: [id number] Date of Birth/Sex: Treating RN: 04/07/1925 (88 y.o. [name] Primary Care Ineta Sinning: [name] Other Clinician: Referring Shabrea Weldin: Treating Corretta Munce/Extender: Greig Bell in Treatment: 63 Compression Therapy Performed for Wound Assessment: Wound #20 Bell,Circumferential Lower Leg Performed By: Clinician [name] Hammock, RN Compression Type: Three Layer Post Procedure Diagnosis Same as Pre-procedure Electronic Signature(s) Signed: 12/19/2021 5:26:06 PM By: [name] Hammock RN Entered By: [name] on 12/19/2021 14:28:49 [name], [name] ([id number]) [id number] Page 2 of 10 -------------------------------------------------------------------------------- Compression Therapy Details Patient Name: Date of Service: [name], [name]. 12/19/2021 1:15 PM Medical Record Number: [id number] Patient Account Number: [id number] Date of Birth/Sex: Treating RN: August 09, 1925 (88 y.o. [name] Primary Care Rossie Bretado: [name] Other Clinician: Referring [name]: Treating [name]/Extender: Greig Bell in Treatment: 58 Compression Therapy Performed for Wound Assessment: Wound #7RR Left,Circumferential Lower Leg Performed By: Clinician [name] Hammock, RN Compression Type: Three Layer Post Procedure Diagnosis Same as Pre-procedure Electronic Signature(s) Signed: 12/19/2021 5:26:06 PM By: [name] Hammock RN Entered By: [name] on 12/19/2021 14:28:49 -------------------------------------------------------------------------------- Encounter Discharge Information Details Patient Name: Date of Service: [name], [name] 12/19/2021 1:15 PM Medical Record Number: [id number] Patient Account Number: [id number] Date of Birth/Sex: Treating RN: 07-Apr-1925 (88 y.o. [name], [name] Primary Care Alexica Schlossberg: [name] Other Clinician: Referring [name]: Treating [name]/Extender: Greig Bell in Treatment: 41 Encounter Discharge Information Items Post Procedure Vitals Discharge Condition: Stable Temperature (F):  98.7 Ambulatory Status: Walker Pulse (bpm): 74 Discharge Destination: Home Respiratory Rate (breaths/min): 17 Transportation: Private Auto Blood Pressure (mmHg): 120/80 Accompanied By: transporter Schedule Follow-up Appointment: Yes Clinical Summary of Care: Patient Declined Electronic Signature(s) Signed: 12/19/2021 5:26:06 PM By: [name] Hammock RN Entered By: [name] on 12/19/2021 16:35:00 -------------------------------------------------------------------------------- Lower Extremity Assessment Details Patient Name: Date of Service: [name], [name]. 12/19/2021 1:15 PM Medical Record Number: [id number] Patient Account Number: [id number] Date of Birth/Sex: Treating RN: 06-07-1925 (88 y.o. F) Primary Care [name]: [name] Other Clinician: Referring [name]: Treating [name]/Extender: [name] Weeks in Treatment: 38 Edema Assessment Assessed: [Left: No] [Bell: No] Edema: [Left:  No] [Bell: No] Calf [name], TODARO Bell ([id number]) [id number].pdf Page 3 of 10 Left: Bell: Point of Measurement: 30 cm From Medial Instep 34 cm 32.5 cm Ankle Left: Bell: Point of Measurement: 9 cm From Medial Instep 22 cm 21.9 cm Electronic Signature(s) Signed: 12/20/2021 1:02:55 PM By: Erenest Blank Entered By: Erenest Blank on 12/19/2021 13:58:54 -------------------------------------------------------------------------------- Multi Wound Chart Details Patient Name: Date of Service: [name], [name]. 12/19/2021 1:15 PM Medical Record Number: [id number] Patient Account Number: [id number] Date of Birth/Sex: Treating RN: 07-Jul-1925 (88 y.o. F) Primary Care Konstantina Nachreiner: [name] Other Clinician: Referring Kelso Bibby: Treating [name]/Extender: Greig Bell in Treatment: 80 Vital Signs Height(in): 65 Pulse(bpm): 18 Weight(lbs): 163 Blood Pressure(mmHg): 118/82 Body Mass  Index(BMI): 27.1 Temperature(F): 97.7 Respiratory Rate(breaths/min): 18 [20:Photos:] [N/Bell:N/Bell] Bell, Circumferential Lower Leg Left, Circumferential Lower Leg N/Bell Wound Location: Gradually Appeared Blister N/Bell Wounding Event: Lymphedema Malignant Wound N/Bell Primary Etiology: Anemia, Arrhythmia, Congestive Heart Anemia, Arrhythmia, Congestive Heart N/Bell Comorbid History: Failure, Hypertension, Colitis, Failure, Hypertension, Colitis, Osteoarthritis Osteoarthritis 06/25/2021 04/20/2020 N/Bell Date Acquired: 25 79 N/Bell Weeks of Treatment: Open Open N/Bell Wound Status: No Yes N/Bell Wound Recurrence: Yes Yes N/Bell Clustered Wound: 29x20x0.1 23x19x0.1 N/Bell Measurements L x W x D (cm) 455.531 343.219 N/Bell Bell (cm) : rea 45.553 34.322 N/Bell Volume (cm) : -14.60% -11076.10% N/Bell % Reduction in Area: -14.60% -11079.80% N/Bell % Reduction in Volume: Full Thickness Without Exposed Full Thickness Without Exposed N/Bell Classification: Support Structures Support Structures Large Large N/Bell Exudate Amount: Serosanguineous Serosanguineous N/Bell Exudate TypeCASSADIE, PANKONIN Bell ([id number]) [id number] Page 4 of 10 red, brown red, brown N/Bell Exudate Color: Yes Yes N/Bell Foul Odor Bell Cleansing: fter No No N/Bell Odor Anticipated Due to Product Use: Distinct, outline attached Distinct, outline attached N/Bell Wound Margin: Large (67-100%) Small (1-33%) N/Bell Granulation Bell mount: Red, Pink Red, Pink N/Bell Granulation Quality: Small (1-33%) Large (67-100%) N/Bell Necrotic Amount: Fat Layer (Subcutaneous Tissue): Yes Fat Layer (Subcutaneous Tissue): Yes N/Bell Exposed Structures: Fascia: No Fascia: No Tendon: No Tendon: No Muscle: No Muscle: No Joint: No Joint: No Bone: No Bone: No None None N/Bell Epithelialization: Debridement - Excisional N/Bell N/Bell Debridement: Pre-procedure Verification/Time Out 14:23 N/Bell N/Bell Taken: Lidocaine N/Bell N/Bell Pain Control: Subcutaneous, Slough N/Bell N/Bell Tissue  Debrided: Skin/Subcutaneous Tissue N/Bell N/Bell Level: 35 N/Bell N/Bell Debridement Bell (sq cm): rea Curette N/Bell N/Bell Instrument: Minimum N/Bell N/Bell Bleeding: Pressure N/Bell N/Bell Hemostasis Bell chieved: 0 N/Bell N/Bell Procedural Pain: 0 N/Bell N/Bell Post Procedural Pain: Procedure was tolerated well N/Bell N/Bell Debridement Treatment Response: 29x20x0.1 N/Bell N/Bell Post Debridement Measurements L x W x D (cm) 45.553 N/Bell N/Bell Post Debridement Volume: (cm) Excoriation: No Excoriation: No N/Bell Periwound Skin Texture: Induration: No Induration: No Callus: No Callus: No Crepitus: No Crepitus: No Rash: No Rash: No Scarring: No Scarring: No Maceration: Yes Maceration: Yes N/Bell Periwound Skin Moisture: Dry/Scaly: No Dry/Scaly: No Hemosiderin Staining: Yes Hemosiderin Staining: Yes N/Bell Periwound Skin Color: Atrophie Blanche: No Atrophie Blanche: No Cyanosis: No Cyanosis: No Ecchymosis: No Ecchymosis: No Erythema: No Erythema: No Mottled: No Mottled: No Pallor: No Pallor: No Rubor: No Rubor: No No Abnormality No Abnormality N/Bell Temperature: Yes N/Bell N/Bell Tenderness on Palpation: Compression Therapy Compression Therapy N/Bell Procedures Performed: Debridement Treatment Notes Electronic Signature(s) Signed: 12/19/2021 4:36:48 PM By: Kalman Shan DO Entered By: Kalman Shan on 12/19/2021 15:36:25 -------------------------------------------------------------------------------- Multi-Disciplinary Care Plan Details Patient Name: Date of Service: [name], [name]. 12/19/2021 1:15 PM Medical Record Number: [id number] Patient Account Number:  [id number] Date of Birth/Sex: Treating RN: 28-Jul-1925 (88 y.o. [name], [name] Primary Care Lynanne Delgreco: [name] Other Clinician: Referring Suhani Stillion: Treating Nur Rabold/Extender: Greig Bell in Treatment: 53 [place] reviewed with physician Active Inactive ANAIAH, MCMANNIS Bell ([id number])  [id number] Page 5 of 10 Venous Leg Ulcer Nursing Diagnoses: Knowledge deficit related to disease process and management Potential for venous Insuffiency (use before diagnosis confirmed) Goals: Patient will maintain optimal edema control Date Initiated: 11/01/2020 Target Resolution Date: 12/20/2021 Goal Status: Active Interventions: Assess peripheral edema status every visit. Compression as ordered Provide education on venous insufficiency Treatment Activities: Therapeutic compression applied : 11/01/2020 Notes: 12/27/20: Edema control ongoing 01/24/21: Edema control continues, using 3 layer compression Wound/Skin Impairment Nursing Diagnoses: Impaired tissue integrity Knowledge deficit related to ulceration/compromised skin integrity Goals: Patient/caregiver will verbalize understanding of skin care regimen Date Initiated: 06/11/2020 Target Resolution Date: 12/20/2021 Goal Status: Active Interventions: Assess patient/caregiver ability to obtain necessary supplies Assess patient/caregiver ability to perform ulcer/skin care regimen upon admission and as needed Assess ulceration(s) every visit Provide education on ulcer and skin care Notes: 10/11/20: Wound care regimen ongoing. 12/27/20: Wound care continues Electronic Signature(s) Signed: 12/19/2021 5:26:06 PM By: [name] Hammock RN Entered By: [name] on 12/19/2021 14:25:13 -------------------------------------------------------------------------------- Pain Assessment Details Patient Name: Date of Service: [name], [name] 12/19/2021 1:15 PM Medical Record Number: [id number] Patient Account Number: [id number] Date of Birth/Sex: Treating RN: [birth year] (88 y.o. F) Primary Care Joey Hudock: [name] Other Clinician: Referring Eulanda Dorion: Treating Jamine Highfill/Extender: Greig Bell in Treatment: 80 Active Problems Location of Pain Severity and Description  of Pain Patient Has Paino No Site Locations [place], [employer] ([id number]) [id number] Page 6 of 10 Pain Management and Medication Current Pain Management: Electronic Signature(s) Signed: 12/20/2021 1:02:55 PM By: Erenest Blank Entered By: Erenest Blank on 12/19/2021 13:35:02 -------------------------------------------------------------------------------- Patient/Caregiver Education Details Patient Name: Date of Service: [name], Karsten Bell. 11/30/2023andnbsp1:15 PM Medical Record Number: [id number] Patient Account Number: [id number] Date of Birth/Gender: Treating RN: 1925-04-14 (88 y.o. [name], [name] Primary Care Physician: [name] Other Clinician: Referring Physician: Treating Physician/Extender: Greig Bell in Treatment: 77 Education Assessment Education Provided To: Patient Education Topics Provided Venous: Methods: Explain/Verbal Responses: Reinforcements needed, State content correctly Electronic Signature(s) Signed: 12/19/2021 5:26:06 PM By: [name] Hammock RN Entered By: [name] on 12/19/2021 14:30:14 -------------------------------------------------------------------------------- Wound Assessment Details Patient Name: Date of Service: [name], [place]. 12/19/2021 1:15 PM Medical Record Number: [id number] Patient Account Number: [id number] Date of Birth/Sex: Treating RN: 1925-11-14 (88 y.o. F) Primary Care Day Deery: [name] Other Clinician: Referring Angelyse Heslin: Treating Mekiah [place]/Extender: Abigael, Mogle, [place] ([id number]) [id number] Page 7 of 10 Weeks in Treatment: 79 Wound Status Wound Number: 20 Primary Lymphedema Etiology: Wound Location: Bell, Circumferential Lower Leg Wound Open Wounding Event: Gradually Appeared Status: Date Acquired: 06/25/2021 Comorbid Anemia, Arrhythmia, Congestive Heart Failure,  Hypertension, Weeks Of Treatment: 25 History: Colitis, Osteoarthritis Clustered Wound: Yes Photos Wound Measurements Length: (cm) 29 Width: (cm) 20 Depth: (cm) 0.1 Area: (cm) 455.531 Volume: (cm) 45.553 % Reduction in Area: -14.6% % Reduction in Volume: -14.6% Epithelialization: None Tunneling: No Undermining: No Wound Description Classification: Full Thickness Without Exposed Support Structures Wound Margin: Distinct, outline attached Exudate Amount: Large Exudate Type: Serosanguineous Exudate Color: red, brown Foul Odor After Cleansing: Yes Due to Product Use: No Slough/Fibrino Yes Wound Bed Granulation Amount: Large (67-100%) Exposed Structure Granulation Quality: Red, Pink Fascia Exposed: No Necrotic  Amount: Small (1-33%) Fat Layer (Subcutaneous Tissue) Exposed: Yes Necrotic Quality: Adherent Slough Tendon Exposed: No Muscle Exposed: No Joint Exposed: No Bone Exposed: No Periwound Skin Texture Texture Color No Abnormalities Noted: No No Abnormalities Noted: No Callus: No Atrophie Blanche: No Crepitus: No Cyanosis: No Excoriation: No Ecchymosis: No Induration: No Erythema: No Rash: No Hemosiderin Staining: Yes Scarring: No Mottled: No Pallor: No Moisture Rubor: No No Abnormalities Noted: No Dry / Scaly: No Temperature / Pain Maceration: Yes Temperature: No Abnormality Tenderness on Palpation: Yes Treatment Notes Wound #20 (Lower Leg) Wound Laterality: Bell, Circumferential Cleanser Soap and Water Discharge Instruction: May shower and wash wound with dial antibacterial soap and water prior to dressing change. Wound Cleanser Discharge Instruction: Cleanse the wound with wound cleanser prior to applying Bell clean dressing using gauze sponges, not tissue or cotton balls. [place], [place] ([id number]) [id number] Page 8 of 10 cetaphil lotion Discharge Instruction: patient to provide to nurses to apply to both  legs. Topical Gentamicin Discharge Instruction: As directed by physician Mupirocin Ointment Discharge Instruction: Apply Mupirocin (Bactroban) as instructed Primary Dressing KerraCel Ag Gelling Fiber Dressing, 4x5 in (silver alginate) Discharge Instruction: Apply silver alginate to wound bed as instructed Secondary Dressing ABD Pad, 8x10 Discharge Instruction: Apply over primary dressing as directed. Secured With Compression Wrap ThreePress (3 layer compression wrap) Discharge Instruction: Apply three layer compression as directed. ***PLEASE USE KERLIX INSTEAD OF COTTON.*** Compression Stockings Add-Ons Electronic Signature(s) Signed: 12/20/2021 1:02:55 PM By: Erenest Blank Entered By: Erenest Blank on 12/19/2021 14:01:19 -------------------------------------------------------------------------------- Wound Assessment Details Patient Name: Date of Service: [name], [place] 12/19/2021 1:15 PM Medical Record Number: [id number] Patient Account Number: [id number] Date of Birth/Sex: Treating RN: 1926-01-09 (88 y.o. F) Primary Care Buddy Loeffelholz: [name] Other Clinician: Referring Shelby Peltz: Treating Chioma Mukherjee/Extender: Greig Bell in Treatment: 76 Wound Status Wound Number: [id number] Primary Malignant Wound Etiology: Wound Location: Left, Circumferential Lower Leg Wound Open Wounding Event: Blister Status: Date Acquired: 04/20/2020 Comorbid Anemia, Arrhythmia, Congestive Heart Failure, Hypertension, Weeks Of Treatment: 79 History: Colitis, Osteoarthritis Clustered Wound: Yes Photos Wound Measurements Length: (cm) 23 Width: (cm) 19 Depth: (cm) 0.1 Archey, Javae Bell ([id number]) Area: (cm) 343.219 Volume: (cm) 34.322 % Reduction in Area: -11076.1% % Reduction in Volume: -11079.8% Epithelialization: None [id number] Page 9 of 10 Tunneling: No Undermining: No Wound Description Classification: Full Thickness  Without Exposed Support Structures Wound Margin: Distinct, outline attached Exudate Amount: Large Exudate Type: Serosanguineous Exudate Color: red, brown Foul Odor After Cleansing: Yes Due to Product Use: No Slough/Fibrino Yes Wound Bed Granulation Amount: Small (1-33%) Exposed Structure Granulation Quality: Red, Pink Fascia Exposed: No Necrotic Amount: Large (67-100%) Fat Layer (Subcutaneous Tissue) Exposed: Yes Necrotic Quality: Adherent Slough Tendon Exposed: No Muscle Exposed: No Joint Exposed: No Bone Exposed: No Periwound Skin Texture Texture Color No Abnormalities Noted: No No Abnormalities Noted: No Callus: No Atrophie Blanche: No Crepitus: No Cyanosis: No Excoriation: No Ecchymosis: No Induration: No Erythema: No Rash: No Hemosiderin Staining: Yes Scarring: No Mottled: No Pallor: No Moisture Rubor: No No Abnormalities Noted: No Dry / Scaly: No Temperature / Pain Maceration: Yes Temperature: No Abnormality Treatment Notes Wound #7RR (Lower Leg) Wound Laterality: Left, Circumferential Cleanser Soap and Water Discharge Instruction: May shower and wash wound with dial antibacterial soap and water prior to dressing change. Wound Cleanser Discharge Instruction: Cleanse the wound with wound cleanser prior to applying Bell clean dressing using gauze sponges, not tissue or cotton balls. Peri-Wound Care cetaphil lotion Discharge Instruction:  patient to provide to nurses to apply to both legs. Topical Gentamicin Discharge Instruction: As directed by physician Mupirocin Ointment Discharge Instruction: Apply Mupirocin (Bactroban) as instructed Primary Dressing KerraCel Ag Gelling Fiber Dressing, 4x5 in (silver alginate) Discharge Instruction: Apply silver alginate to wound bed as instructed Secondary Dressing ABD Pad, 8x10 Discharge Instruction: Apply over primary dressing as directed. Secured With Compression Wrap ThreePress (3 layer compression  wrap) Discharge Instruction: Apply three layer compression as directed. ***PLEASE USE KERLIX INSTEAD OF COTTON.*** Compression Stockings Add-Ons Electronic Signature(s) Signed: 12/20/2021 1:02:55 PM By: Harriette Ohara, Jeanni Bell ([id number]) [id number] Page 10 of 10 Entered By: Erenest Blank on 12/19/2021 14:01:56 -------------------------------------------------------------------------------- Vitals Details Patient Name: Date of Service: [name], [name] [place]. 12/19/2021 1:15 PM Medical Record Number: [id number] Patient Account Number: [id number] Date of Birth/Sex: Treating RN: [birth year] (88 y.o. F) Primary Care Domique Clapper: [name] Other Clinician: Referring Genette Huertas: Treating Jerianne Anselmo/Extender: Greig Bell in Treatment: 42 Vital Signs Time Taken: 13:34 Temperature (F): 97.7 Height (in): 65 Pulse (bpm): 83 Weight (lbs): 163 Respiratory Rate (breaths/min): 18 Body Mass Index (BMI): 27.1 Blood Pressure (mmHg): 118/82 Reference Range: 80 - 120 mg / dl Electronic Signature(s) Signed: 12/20/2021 1:02:55 PM By: Erenest Blank Entered By: Erenest Blank on 12/19/2021 13:34:55

## 2021-12-20 NOTE — Progress Notes (Signed)
KHYLEE, ALGEO Bell ([id number]) [id number] Page 1 of 14 Visit Report for 12/19/2021 Chief Complaint Document Details Patient Name: Date of Service: [name], [name] 12/19/2021 1:15 PM Medical Record Number: [id number] Patient Account Number: [id number] Date of Birth/Sex: Treating RN: 1925-05-26 (88 y.o. F) Primary Care Provider: [name] Other Clinician: Referring Provider: Treating Provider/Extender: [name] in Treatment: 8 Information Obtained from: Patient Chief Complaint Bilateral lower extremity wounds that have been biopsied and positive for squamous cell carcinoma Bilateral lower extremity wounds due to chronic venous insufficiency/lymphedema Electronic Signature(s) Signed: 12/19/2021 4:36:48 PM By: [name] Shan DO Entered By: [name] on 12/19/2021 15:36:36 -------------------------------------------------------------------------------- Debridement Details Patient Name: Date of Service: [name], [place] 12/19/2021 1:15 PM Medical Record Number: [id number] Patient Account Number: [id number] Date of Birth/Sex: Treating RN: September 23, 1925 (88 y.o. [name], [name] Primary Care Provider: [name] Other Clinician: Referring Provider: Treating Provider/Extender: [name] in Treatment: 39 Debridement Performed for Assessment: Wound #20 Bell,Circumferential Lower Leg Performed By: Physician [name] Shan, DO Debridement Type: Debridement Level of Consciousness (Pre-procedure): Awake and Alert Pre-procedure Verification/Time Out Yes - 14:23 Taken: Start Time: 14:23 Pain Control: Lidocaine T Area Debrided (L x W): otal 5 (cm) x 7 (cm) = 35 (cm) Tissue and other material debrided: Viable, Non-Viable, Slough, Subcutaneous, Slough Level: Skin/Subcutaneous Tissue Debridement Description: Excisional Instrument: Curette Bleeding:  Minimum Hemostasis Achieved: Pressure End Time: 14:23 Procedural Pain: 0 Post Procedural Pain: 0 Response to Treatment: Procedure was tolerated well Level of Consciousness (Post- Awake and Alert procedure): Post Debridement Measurements of Total Wound Length: (cm) 29 Width: (cm) 20 Depth: (cm) 0.1 Volume: (cm) 45.553 Character of Wound/Ulcer Post Debridement: Improved [name], [name] ([id number]) [id number] Page 2 of 14 Post Procedure Diagnosis Same as Pre-procedure Electronic Signature(s) Signed: 12/19/2021 4:36:48 PM By: [name] Shan DO Signed: 12/19/2021 5:26:06 PM By: [name] Hammock RN Entered By: [name] on 12/19/2021 14:24:56 -------------------------------------------------------------------------------- HPI Details Patient Name: Date of Service: [name], [place]. 12/19/2021 1:15 PM Medical Record Number: [id number] Patient Account Number: [id number] Date of Birth/Sex: Treating RN: 1925/09/23 (88 y.o. F) Primary Care Provider: [name] Other Clinician: Referring Provider: Treating Provider/Extender: [name] in Treatment: 69 History of Present Illness Location: left leg HPI Description: Admission 5/23 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 She states that the wound on the Bell has been present for Bell year. The 1 on the left has opened up 1 month ago. She is followed with oncology for this issue as she had biopsies that showed squamous cell carcinoma. She is also seeing radiation oncology for treatment options. She presents today because she would like for her wounds to be healed by [place]. She currently denies signs of infection. 6/1; patient  presents for 1 week follow-up. She states she has tolerated the leg wraps well. She states these do not bother her and is happy to continue with them. She is scheduled to see her oncologist today to go over treatment options for the bilateral lower extremity squamous cell carcinoma. Radiation is currently not Bell recommended option. Patient states she overall feels well. 6/22; patient presents for 3-week follow-up. She has tolerated the wraps well until her last wrap where she states they were uncomfortable.  She attributes this to the home health nurse. She denies signs of infection. She has started her first treatment of antibody infusions for her Bilateral lower extremity squamous cell carcinoma. She has no complaints today. 7/21; patient presents for 1 month follow-up. Unfortunately she has not had good experience with her wrap changes with home health. She would like to do her own dressing changes. She continues to do her antibody infusions. She denies signs of infection. 7/28; patient presents for 1 week follow-up. At last clinic visit she was switched to daily dressing changes due to issues with the wrap and home health placing them. Unfortunately she has developed weeping to her legs bilaterally. She would like to be placed in wraps today. She would also like to follow with [place] weekly for wrap changes instead of having home health change them. She denies signs of infection. 8/4; patient presents for 1 week follow-up. She has tolerated the Kerlix/Coban wraps well. She no longer has weeping to her legs. She took the wrap off 1 day before coming in to be able to take Bell shower. She has no issues or complaints today. She denies signs of infection. 8/18; patient presents for follow-up. Patient has tolerated the wraps well. She brought her Velcro compression wraps today. She has no issues or complaints today. She had her chemotherapy infusion yesterday without issues. She denies signs of infection. 8/25;  patient presents for follow-up. She used her juxta lite compressions for the past week. It is unclear if she is able to put these on correctly since she states she has Bell hard time getting them to look Bell. She reports 2 open wounds. She currently denies signs of infection. 9/1; patient presents for 1 week follow-up. She has 1 open wound. She tolerated the compression wraps well. She currently denies signs of infection. 9/8; patient presents for 1 week follow-up. She has 2 open wounds 1 on each leg. She has tolerated the compression wrap well. She currently denies signs of infection. 9/15; patient presents for 1 week follow-up. She now has 3 wounds. 2 on the left and 1 on the Bell. She continues to tolerate the compression wrap well. She currently denies signs of infection. She has obtained furosemide by her primary care physician and would like to discuss when to take this. 9/22; patient presents for 1 week follow-up. She has scattered wounds on her lower extremities bilaterally. She did take furosemide twice in the past week. She does not recall having to urinate more frequently. She tolerated the 3 layer compression well. She denies signs of infection. 10/13; patient has not been here recently because of [place]. Apparently the facility was only putting gauze on her legs. This is Bell patient I do not normally see. She has Bell history of squamous cell carcinoma bilaterally on her anterior lower legs followed for Bell period of time by Dr. Ronnald Ramp at [facility] dermatology. She is quite convincing that she did not have radiation to her lower legs. It is likely she also has significant chronic venous insufficiency stasis dermatitis. We have been using silver alginate under kerlix Coban. She has obvious open areas medially on the Bell and areas on the left. She also has areas of extensive dry flaking adherent areas on the Bell and to Bell lesser extent on the left anterior. Nodular areas on the left lateral lower  leg left posterior calf and Bell mid calf medially. 10/21; patient presents for follow-up. She has no issues or complaints today. She has tolerated the  3 layer compression well bilaterally. She denies signs of infection. 10/27; patient presents for follow-up. She continues to tolerate the 3 layer compression wrap well. She denies signs of infection. 11/30; patient presents for follow-up. She has no issues or complaints today. [name], [name] Bell ([id number]) [id number] Page 3 of 14 11/10; patient arrived in clinic today for nurse visit accompanied by her daughter from [place]. The daughter had multiple questions so we turned this into doctors visit. Apparently after the last time I saw this woman in October she went to see Dr. Ronnald Ramp but he did not take the wraps off. She previously was treated with Cemiplimab for her squamous cell carcinoma. She did not receive radiation to her legs. I had wanted Dr. Ronnald Ramp to look at this because of the exceptionally damaged skin on her lower legs bilaterally. She has odd looking wounds on the left medial lower leg also extending posteriorly which we have not made Bell lot of progress on. But she also has Bell raised hyperkeratotic nodules on the Bell anterior tibial area plaques on the left medial lower thigh. These are not areas that are under compression. We have been using silver alginate on any open areas Liberal TCA under 3 layer compression. 11/17; patient presents for follow-up. She had her wraps taken off yesterday for her dermatology appointment. She reports excessive weeping to her legs bilaterally after the wraps were taken off. She currently denies signs of infection. 12/12/2020 upon evaluation today patient appears to be doing about the same in regard to her wounds. She was actually started on Cipro after having biopsies apparently at her dermatology clinic she does not have the results back from the actual biopsy but the culture did  return and apparently they placed her on the Cipro she started that just this morning. Other than that her legs appear to be doing okay at this time. 12/1; patient presents for follow-up. She has no issues or complaints today. She has started Lasix 40 mg daily to help with her bilateral leg swelling. 12/8; patient presents for follow-up. She has no issues or complaints today. 12/15; patient presents for 1 week follow-up. She has no issues or complaints today. 12/22; patient presents for follow-up. She has no issues or complaints today. 1/5; patient presents for follow-up. She has no issues or complaints today. She has tolerated the compression wraps well. 1/12; patient presents for follow-up. She is tolerated the compression wrap well and has no issues or complaints today. She denies signs of infection. 1/19; patient presents for follow-up. She took the compression wraps off 1 to 2 days ago to take Bell shower and did not have compression wraps replaced. She reports increased blistering throughout her legs bilaterally. She currently denies signs of infection. 1/26; patient presents for follow-up. She has no issues or complaints today. She tolerated the compression wraps well. She has not heard from oncology to schedule an appointment. 2/2; patient presents for follow-up. She has no issues or complaints today. She continues to tolerate the compression wrap well. 2/16; patient presents for follow-up. She has no issues or complaints today. 2/23; patient presents for follow-up. She has no issues or complaints today. Tomorrow she has an appointment with oncology to look at the suspicious lesion on her Bell lower extremity. She currently denies signs of infection. 3/2; patient presents for follow-up. She has been using her juxta light compression to the Bell lower extremity however there has been increased swelling and opening of wounds throughout the legs with weeping. She  currently denies signs of  infection. She had no issues with the compression wrap to the left lower extremity. 3/16; patient presents for follow-up. She has no issues or complaints today. She states that she has been restarted on infusions for her squamous cell carcinoma of her legs. She has also been increased on Lasix to 40 mg twice daily. She has noticed Bell decrease in swelling to her legs. 3/23; patient presents for follow-up. She starts her second infusion next week for her squamous cell carcinoma lesions to her legs. She reports no issues with the compression wraps. 3/30; patient presents for follow-up. She has no issues or complaints today. 4/6; patient presents for follow-up. Her left lower extremity wounds have healed. She has her juxta light compression with her today. She has no issues or complaints today. 4/13; this is Bell patient we actually discharged to juxta lite stockings on the left leg the last time she was here. She came in on Monday for [place] to look at the leg and Bell nurse visit. She had massive swelling and numerous blisters and wound reopening. We put her back in 3 layer compression although I did not generate Bell note. Silver alginate on the wounded areas. We have also been doing the same on the Bell. In the meantime she is obtained her external compression pumps that were previously ordered and she started to use them. She has skin cancers that are obvious on the Bell leg x2 her appointment with Dr. Jarome Matin of dermatology is on May 5.. She is also receiving weekly chemotherapy for recurrent presumably squamous cell carcinomas apparently has had 2 treatments 4/20; patient presents for follow-up. She has her new juxta lite compressions today. She continues to have open wounds to the left lower extremity. She has follow-up with dermatology in 2 weeks. She continues to have IV infusions for her squamous cell carcinoma lesions on her legs. 4/28; the patient has bilateral lower extremity wounds predominantly  on the Bell lateral upper leg, Bell anterior lower leg which in itself is probably Bell recurrent cancer as well as the left lateral lower leg. She has recurrent squamous cell carcinoma currently being treated with an IV infusion. She also has scattered nodules on her upper lower legs and thighs I am not certain of all of this is felt to be malignant as well We have been using silver alginate on the open areas wrapping her legs. She also has her external compression pumps at home but I am not clear that she is going to use these 5/2; patient presents for follow-up. She has not been taking her diuretics as prescribed. She sees Dr. Dian Situ, dermatology at the end of the week. She tolerated the compression wraps well today. She has her juxta lite compressions. 5/8; patient presents for follow-up. She saw Dr. Dian Situ, dermatology and she has nothing to report on. She has been using her juxta lite compression to the Bell lower extremity. She has tolerated the compression wrap well in the left lower extremity. She has not been taking her diuretic. She is scheduled to continue her infusions for her squamous cell carcinoma on her legs. 5/18; patient presents for follow-up. We wrapped her in 3 layer compression at last clinic visit. She has no issues or complaints today. 5/22; patient presents for follow-up. She has been wrapped in 3 layer compression bilaterally to her lower extremities over the past week. She has been scratching at the top of the wrap and picking at her skin. This area  has increased warmth and erythema. 5/30; patient presents for follow-up. She completed Keflex with resolution of her symptoms to the Bell lower extremity. She no longer has increased warmth or erythema. She continues her infusions for her squamous cell carcinoma lesions. We continue to wrap her in 3 layer compression with silver alginate. She currently denies signs of infection. 6/8; the patient went to see Dr. Ronnald Ramp of  dermatology 2 days ago unfortunately we do not have his notes. They removed her compression wraps of course but did not adequately replace them. She comes in today with 3 wounds on the Bell lateral upper calf 1 medially below the obvious squamous cell carcinoma there is Bell large necrotic wound on the left lateral tibial area on the left and several blisters. [name], [name] ([id number]) [id number] Page 4 of 14 The patient remains on Cemiplimad infusions for the squamous cell carcinoma bilaterally. She is not felt to be Bell good candidate for radiation. Some of the skin changes superiorly in the upper legs bilaterally according to Dr. Ronnald Ramp related to these infusions the same like fleshy nodules to me not blisters 6/13; patient presents for follow-up. We have been using silver alginate under 3 layer compression bilaterally. She has no issues or complaints today. 6/19; patient presents for follow-up. She will see her oncologist in 3 days and next week her dermatologist. She has no new complaints today. We are using silver alginate under 3 layer compression bilaterally. 6/29; patient presents for follow-up. She she saw Dr. Darrin Nipper last week and States she received injections to the lesions on her legs. We will ask for office visit records. She could not be wrapped with compression and has more drainage on exam today. 7/6; patient presents for follow-up. She continues to receive injections to the lesions in her legs by her dermatologist. She had this done earlier today. She has no issues or complaints today. 7/13; patient presents for follow-up. She is following with Dr.Kavuri once weekly for treatment of her SCCs/KAs with IL5-FU. We have been wrapping her with silver alginate under Kerlix/Coban. She has no issues or complaints today. 7/20; patient presents for follow-up. She has elected to stop doing weekly treatments for her squamous cell carcinoma lesions. She states she  experiences too much pain from the injections. She is continuing her immunotherapy infusions. Next 1 is scheduled in August. T the legs we have been using silver alginate o under Kerlix/Coban. Her wounds actually look better today. 7/27; patient presents for follow-up. We have been using Medihoney and silver alginate under compression therapy. She has no issues or complaints today. 8/1; patient presents for follow-up. We have been using Medihoney and silver alginate under compression therapy. She reports no issues. She is scheduled for her infusion for her SCC later this week. 8/22; patient presents for follow-up. We have been using Medihoney and silver alginate under compression therapy. She reports no issues today. She has moved back home from the skilled nursing facility. She has home health that will be coming out to do OT and PT She would like for them to also do wound . care. 9/18; patient presents for follow-up. We have been using Medihoney and silver alginate under compression therapy. Home health is no longer coming out for wound care. She comes in weekly for wrap changes in our office During nurse visits. 10/2; patient presents for follow-up. We have been using silver alginate under compression therapy. She has no issues or complaints today. She states she is doing her last chemoinfusion  next week. 10/23; Patient presents for follow-up. We continue to use silver alginate under compression therapy. She has Bell few more wounds present today. She states she is taking her lasix as prescribed. She has completed her infusions for her SCC lesions. 11/16; patient presents for follow-up. At last clinic visit she had more drainage and irritation and I recommended adding gentamicin and mupirocin with ketoconazole under the dressing and wrap. T oday her wounds have improved in appearance and drainage. She has no issues or complaints today. 11/30; patient presents for follow-up. I had recommended  increased frequency in wrap changes due to drainage and odor Over the past several clinic visits. Patient now has home health. They are coming out 3 times Bell week. Her wounds have improved significantly. She has no issues or complaints today. Electronic Signature(s) Signed: 12/19/2021 4:36:48 PM By: [name] Shan DO Entered By: [name] on 12/19/2021 15:38:24 -------------------------------------------------------------------------------- Physical Exam Details Patient Name: Date of Service: [name], [place] 12/19/2021 1:15 PM Medical Record Number: [id number] Patient Account Number: [id number] Date of Birth/Sex: Treating RN: Sep 04, 1925 (88 y.o. F) Primary Care Provider: [name] Other Clinician: Referring Provider: Treating Provider/Extender: [name] in Treatment: 52 Constitutional respirations regular, non-labored and within target range for patient.. Cardiovascular 2+ dorsalis pedis/posterior tibialis pulses. Psychiatric pleasant and cooperative. Notes Multiple scattered open wounds to her lower extremities bilaterally With granulation tissue and slough. No surrounding signs of infection. Good edema control. Lymphedema skin changes. No significant drainage or odor noted. Electronic Signature(s) Signed: 12/19/2021 4:36:48 PM By: Cranston Neighbor ([id number]) [id number] Page 5 of 14 Signed: 12/19/2021 4:36:48 PM By: [name] Shan DO Entered By: [name] on 12/19/2021 15:39:23 -------------------------------------------------------------------------------- Physician Orders Details Patient Name: Date of Service: [name], [place] 12/19/2021 1:15 PM Medical Record Number: [id number] Patient Account Number: [id number] Date of Birth/Sex: Treating RN: August 03, 1925 (88 y.o. [name] Primary Care Provider: [name] Other Clinician: Referring  Provider: Treating Provider/Extender: [name] in Treatment: 25 Verbal / Phone Orders: No Diagnosis Coding Follow-up Appointments ppointment in 2 weeks. - w/ Dr. Heber [place] Return Bell Bathing/ Shower/ Hygiene May shower with protection but do not get wound dressing(s) wet. May shower and wash wound with soap and water. - with dressing changes may wash wounds with soap and water. Edema Control - Lymphedema / SCD / Other Lymphedema Pumps. Use Lymphedema pumps on leg(s) 2-3 times Bell day for 45-60 minutes. If wearing any wraps or hose, do not remove them. Continue exercising as instructed. - use one hour each time twice Bell day. Elevate legs to the level of the heart or above for 30 minutes daily and/or when sitting, Bell frequency of: - elevate the legs throughout the day heart level if possible. Avoid standing for long periods of time. Exercise regularly Additional Orders / Instructions Follow [place] to [place] for skilled nursing wound care. May utilize formulary equivalent dressing for wound treatment orders unless otherwise specified. - Medihome to change 3 x Bell week Wound Treatment Wound #20 - Lower Leg Wound Laterality: Bell, Circumferential Cleanser: Soap and Water 1 x Per Week/30 Days Discharge Instructions: May shower and wash wound with dial antibacterial soap and water prior to dressing change. Cleanser: Wound Cleanser 1 x Per Week/30 Days Discharge Instructions: Cleanse the wound with wound cleanser prior to applying Bell clean dressing using gauze sponges, not tissue or cotton balls. Peri-Wound Care: cetaphil lotion 1  x Per Week/30 Days Discharge Instructions: patient to provide to nurses to apply to both legs. Topical: Gentamicin 1 x Per Week/30 Days Discharge Instructions: As directed by physician Topical: Mupirocin Ointment 1 x Per Week/30 Days Discharge Instructions: Apply Mupirocin (Bactroban) as instructed Prim  Dressing: KerraCel Ag Gelling Fiber Dressing, 4x5 in (silver alginate) 1 x Per Week/30 Days ary Discharge Instructions: Apply silver alginate to wound bed as instructed Secondary Dressing: ABD Pad, 8x10 1 x Per Week/30 Days Discharge Instructions: Apply over primary dressing as directed. Compression Wrap: ThreePress (3 layer compression wrap) 1 x Per Week/30 Days Discharge Instructions: Apply three layer compression as directed. ***PLEASE USE KERLIX INSTEAD OF COTTON.*** Wound #7RR - Lower Leg Wound Laterality: Left, Circumferential Cleanser: Soap and Water 1 x Per Week/30 Days Discharge Instructions: May shower and wash wound with dial antibacterial soap and water prior to dressing change. Cleanser: Wound Cleanser 1 x Per Week/30 Days Discharge Instructions: Cleanse the wound with wound cleanser prior to applying Bell clean dressing using gauze sponges, not tissue or cotton balls. KILEA, MCCAREY Bell ([id number]) [id number] Page 6 of 14 Peri-Wound Care: cetaphil lotion 1 x Per Week/30 Days Discharge Instructions: patient to provide to nurses to apply to both legs. Topical: Gentamicin 1 x Per Week/30 Days Discharge Instructions: As directed by physician Topical: Mupirocin Ointment 1 x Per Week/30 Days Discharge Instructions: Apply Mupirocin (Bactroban) as instructed Prim Dressing: KerraCel Ag Gelling Fiber Dressing, 4x5 in (silver alginate) 1 x Per Week/30 Days ary Discharge Instructions: Apply silver alginate to wound bed as instructed Secondary Dressing: ABD Pad, 8x10 1 x Per Week/30 Days Discharge Instructions: Apply over primary dressing as directed. Compression Wrap: ThreePress (3 layer compression wrap) 1 x Per Week/30 Days Discharge Instructions: Apply three layer compression as directed. ***PLEASE USE KERLIX INSTEAD OF COTTON.*** Electronic Signature(s) Signed: 12/19/2021 4:36:48 PM By: [name] Shan DO Entered By: [name] on 12/19/2021  15:39:33 -------------------------------------------------------------------------------- Problem List Details Patient Name: Date of Service: [name], [place] 12/19/2021 1:15 PM Medical Record Number: [id number] Patient Account Number: [id number] Date of Birth/Sex: Treating RN: 1925-03-10 (88 y.o. 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 06/11/2020 No Yes exposed L97.822 Non-pressure chronic ulcer of other part of left lower leg with fat layer exposed5/23/2022 No Yes I89.0 Lymphedema, not elsewhere classified 01/31/2021 No Yes I87.313 Chronic venous hypertension (idiopathic) with ulcer of bilateral lower extremity 03/21/2021 No Yes Inactive Problems Resolved Problems Electronic Signature(s) Signed: 12/19/2021 4:36:48 PM By: Norva Karvonen, [place] ([id number]) [id number] Page 7 of 14 Entered By: [name] on 12/19/2021 15:36:18 -------------------------------------------------------------------------------- Progress Note Details Patient Name: Date of Service: [name], [place] 12/19/2021 1:15 PM Medical Record Number: [id number] Patient Account Number: [id number] Date of Birth/Sex: Treating RN: [birth year] (88 y.o. F) Primary Care Provider: [name] Other Clinician: Referring Provider: Treating Provider/Extender: [name] in Treatment: 37 Subjective Chief Complaint Information obtained from Patient Bilateral lower extremity wounds that have been biopsied and positive for squamous cell carcinoma Bilateral lower extremity wounds due to  chronic venous insufficiency/lymphedema History of Present Illness (HPI) The following HPI elements were documented for the patient's wound: Location: left leg Admission 5/23 Ms. Mrytle Bento is Bell 88 year old  female with Bell past medical history of squamous cell carcinoma to the Bell and left lower legs, left breast cancer, hypothyroidism, chronic venous insufficiency, the presents to our clinic for wounds located to her lower extremities bilaterally. She states that the wound on the Bell has been present for Bell year. The 1 on the left has opened up 1 month ago. She is followed with oncology for this issue as she had biopsies that showed squamous cell carcinoma. She is also seeing radiation oncology for treatment options. She presents today because she would like for her wounds to be healed by [place]. She currently denies signs of infection. 6/1; patient presents for 1 week follow-up. She states she has tolerated the leg wraps well. She states these do not bother her and is happy to continue with them. She is scheduled to see her oncologist today to go over treatment options for the bilateral lower extremity squamous cell carcinoma. Radiation is currently not Bell recommended option. Patient states she overall feels well. 6/22; patient presents for 3-week follow-up. She has tolerated the wraps well until her last wrap where she states they were uncomfortable. She attributes this to the home health nurse. She denies signs of infection. She has started her first treatment of antibody infusions for her Bilateral lower extremity squamous cell carcinoma. She has no complaints today. 7/21; patient presents for 1 month follow-up. Unfortunately she has not had good experience with her wrap changes with home health. She would like to do her own dressing changes. She continues to do her antibody infusions. She denies signs of infection. 7/28; patient presents for 1 week follow-up. At last clinic visit she was  switched to daily dressing changes due to issues with the wrap and home health placing them. Unfortunately she has developed weeping to her legs bilaterally. She would like to be placed in wraps today. She would also like to follow with [place] weekly for wrap changes instead of having home health change them. She denies signs of infection. 8/4; patient presents for 1 week follow-up. She has tolerated the Kerlix/Coban wraps well. She no longer has weeping to her legs. She took the wrap off 1 day before coming in to be able to take Bell shower. She has no issues or complaints today. She denies signs of infection. 8/18; patient presents for follow-up. Patient has tolerated the wraps well. She brought her Velcro compression wraps today. She has no issues or complaints today. She had her chemotherapy infusion yesterday without issues. She denies signs of infection. 8/25; patient presents for follow-up. She used her juxta lite compressions for the past week. It is unclear if she is able to put these on correctly since she states she has Bell hard time getting them to look Bell. She reports 2 open wounds. She currently denies signs of infection. 9/1; patient presents for 1 week follow-up. She has 1 open wound. She tolerated the compression wraps well. She currently denies signs of infection. 9/8; patient presents for 1 week follow-up. She has 2 open wounds 1 on each leg. She has tolerated the compression wrap well. She currently denies signs of infection. 9/15; patient presents for 1 week follow-up. She now has 3 wounds. 2 on the left and 1 on the Bell. She continues to tolerate the compression wrap well. She currently denies signs of infection. She has obtained furosemide by her primary care physician and would like to discuss when to take this. 9/22; patient presents for 1 week follow-up. She has scattered  wounds on her lower extremities bilaterally. She did take furosemide twice in the past week. She does not  recall having to urinate more frequently. She tolerated the 3 layer compression well. She denies signs of infection. 10/13; patient has not been here recently because of [place]. Apparently the facility was only putting gauze on her legs. This is Bell patient I do not normally see. She has Bell history of squamous cell carcinoma bilaterally on her anterior lower legs followed for Bell period of time by Dr. Ronnald Ramp at [facility] dermatology. She is quite convincing that she did not have radiation to her lower legs. It is likely she also has significant chronic venous insufficiency stasis dermatitis. We have been using silver alginate under kerlix Coban. She has obvious open areas medially on the Bell and areas on the left. She also has areas of extensive dry flaking adherent areas on the Bell and to Bell lesser extent on the left anterior. Nodular areas on the left lateral lower leg left posterior calf and Bell mid calf medially. 10/21; patient presents for follow-up. She has no issues or complaints today. She has tolerated the 3 layer compression well bilaterally. She denies signs of infection. 10/27; patient presents for follow-up. She continues to tolerate the 3 layer compression wrap well. She denies signs of infection. 11/30; patient presents for follow-up. She has no issues or complaints today. 11/10; patient arrived in clinic today for nurse visit accompanied by her daughter from [place]. The daughter had multiple questions so we turned this into [name], [name] ([id number]) [id number] Page 8 of 14 doctors visit. Apparently after the last time I saw this woman in October she went to see Dr. Ronnald Ramp but he did not take the wraps off. She previously was treated with Cemiplimab for her squamous cell carcinoma. She did not receive radiation to her legs. I had wanted Dr. Ronnald Ramp to look at this because of the exceptionally damaged skin on her lower legs bilaterally. She has odd looking wounds  on the left medial lower leg also extending posteriorly which we have not made Bell lot of progress on. But she also has Bell raised hyperkeratotic nodules on the Bell anterior tibial area plaques on the left medial lower thigh. These are not areas that are under compression. We have been using silver alginate on any open areas Liberal TCA under 3 layer compression. 11/17; patient presents for follow-up. She had her wraps taken off yesterday for her dermatology appointment. She reports excessive weeping to her legs bilaterally after the wraps were taken off. She currently denies signs of infection. 12/12/2020 upon evaluation today patient appears to be doing about the same in regard to her wounds. She was actually started on Cipro after having biopsies apparently at her dermatology clinic she does not have the results back from the actual biopsy but the culture did return and apparently they placed her on the Cipro she started that just this morning. Other than that her legs appear to be doing okay at this time. 12/1; patient presents for follow-up. She has no issues or complaints today. She has started Lasix 40 mg daily to help with her bilateral leg swelling. 12/8; patient presents for follow-up. She has no issues or complaints today. 12/15; patient presents for 1 week follow-up. She has no issues or complaints today. 12/22; patient presents for follow-up. She has no issues or complaints today. 1/5; patient presents for follow-up. She has no issues or complaints today. She has tolerated the compression wraps well.  1/12; patient presents for follow-up. She is tolerated the compression wrap well and has no issues or complaints today. She denies signs of infection. 1/19; patient presents for follow-up. She took the compression wraps off 1 to 2 days ago to take Bell shower and did not have compression wraps replaced. She reports increased blistering throughout her legs bilaterally. She currently denies signs of  infection. 1/26; patient presents for follow-up. She has no issues or complaints today. She tolerated the compression wraps well. She has not heard from oncology to schedule an appointment. 2/2; patient presents for follow-up. She has no issues or complaints today. She continues to tolerate the compression wrap well. 2/16; patient presents for follow-up. She has no issues or complaints today. 2/23; patient presents for follow-up. She has no issues or complaints today. Tomorrow she has an appointment with oncology to look at the suspicious lesion on her Bell lower extremity. She currently denies signs of infection. 3/2; patient presents for follow-up. She has been using her juxta light compression to the Bell lower extremity however there has been increased swelling and opening of wounds throughout the legs with weeping. She currently denies signs of infection. She had no issues with the compression wrap to the left lower extremity. 3/16; patient presents for follow-up. She has no issues or complaints today. She states that she has been restarted on infusions for her squamous cell carcinoma of her legs. She has also been increased on Lasix to 40 mg twice daily. She has noticed Bell decrease in swelling to her legs. 3/23; patient presents for follow-up. She starts her second infusion next week for her squamous cell carcinoma lesions to her legs. She reports no issues with the compression wraps. 3/30; patient presents for follow-up. She has no issues or complaints today. 4/6; patient presents for follow-up. Her left lower extremity wounds have healed. She has her juxta light compression with her today. She has no issues or complaints today. 4/13; this is Bell patient we actually discharged to juxta lite stockings on the left leg the last time she was here. She came in on Monday for [place] to look at the leg and Bell nurse visit. She had massive swelling and numerous blisters and wound reopening. We put her back  in 3 layer compression although I did not generate Bell note. Silver alginate on the wounded areas. We have also been doing the same on the Bell. In the meantime she is obtained her external compression pumps that were previously ordered and she started to use them. She has skin cancers that are obvious on the Bell leg x2 her appointment with Dr. Jarome Matin of dermatology is on May 5.. She is also receiving weekly chemotherapy for recurrent presumably squamous cell carcinomas apparently has had 2 treatments 4/20; patient presents for follow-up. She has her new juxta lite compressions today. She continues to have open wounds to the left lower extremity. She has follow-up with dermatology in 2 weeks. She continues to have IV infusions for her squamous cell carcinoma lesions on her legs. 4/28; the patient has bilateral lower extremity wounds predominantly on the Bell lateral upper leg, Bell anterior lower leg which in itself is probably Bell recurrent cancer as well as the left lateral lower leg. She has recurrent squamous cell carcinoma currently being treated with an IV infusion. She also has scattered nodules on her upper lower legs and thighs I am not certain of all of this is felt to be malignant as well We have been  using silver alginate on the open areas wrapping her legs. She also has her external compression pumps at home but I am not clear that she is going to use these 5/2; patient presents for follow-up. She has not been taking her diuretics as prescribed. She sees Dr. Dian Situ, dermatology at the end of the week. She tolerated the compression wraps well today. She has her juxta lite compressions. 5/8; patient presents for follow-up. She saw Dr. Dian Situ, dermatology and she has nothing to report on. She has been using her juxta lite compression to the Bell lower extremity. She has tolerated the compression wrap well in the left lower extremity. She has not been taking her diuretic. She is scheduled  to continue her infusions for her squamous cell carcinoma on her legs. 5/18; patient presents for follow-up. We wrapped her in 3 layer compression at last clinic visit. She has no issues or complaints today. 5/22; patient presents for follow-up. She has been wrapped in 3 layer compression bilaterally to her lower extremities over the past week. She has been scratching at the top of the wrap and picking at her skin. This area has increased warmth and erythema. 5/30; patient presents for follow-up. She completed Keflex with resolution of her symptoms to the Bell lower extremity. She no longer has increased warmth or erythema. She continues her infusions for her squamous cell carcinoma lesions. We continue to wrap her in 3 layer compression with silver alginate. She currently denies signs of infection. 6/8; the patient went to see Dr. Ronnald Ramp of dermatology 2 days ago unfortunately we do not have his notes. They removed her compression wraps of course but did not adequately replace them. She comes in today with 3 wounds on the Bell lateral upper calf 1 medially below the obvious squamous cell carcinoma there is Bell large necrotic wound on the left lateral tibial area on the left and several blisters. The patient remains on Cemiplimad infusions for the squamous cell carcinoma bilaterally. She is not felt to be Bell good candidate for radiation. Some of the skin [name], [name] ([id number]) [id number] Page 9 of 14 changes superiorly in the upper legs bilaterally according to Dr. Ronnald Ramp related to these infusions the same like fleshy nodules to me not blisters 6/13; patient presents for follow-up. We have been using silver alginate under 3 layer compression bilaterally. She has no issues or complaints today. 6/19; patient presents for follow-up. She will see her oncologist in 3 days and next week her dermatologist. She has no new complaints today. We are using silver alginate under 3  layer compression bilaterally. 6/29; patient presents for follow-up. She she saw Dr. Darrin Nipper last week and States she received injections to the lesions on her legs. We will ask for office visit records. She could not be wrapped with compression and has more drainage on exam today. 7/6; patient presents for follow-up. She continues to receive injections to the lesions in her legs by her dermatologist. She had this done earlier today. She has no issues or complaints today. 7/13; patient presents for follow-up. She is following with Dr.Kavuri once weekly for treatment of her SCCs/KAs with IL[id number]. We have been wrapping her with silver alginate under Kerlix/Coban. She has no issues or complaints today. 7/20; patient presents for follow-up. She has elected to stop doing weekly treatments for her squamous cell carcinoma lesions. She states she experiences too much pain from the injections. She is continuing her immunotherapy infusions. Next 1 is scheduled in August. T the  legs we have been using silver alginate o under Kerlix/Coban. Her wounds actually look better today. 7/27; patient presents for follow-up. We have been using Medihoney and silver alginate under compression therapy. She has no issues or complaints today. 8/1; patient presents for follow-up. We have been using Medihoney and silver alginate under compression therapy. She reports no issues. She is scheduled for her infusion for her SCC later this week. 8/22; patient presents for follow-up. We have been using Medihoney and silver alginate under compression therapy. She reports no issues today. She has moved back home from the skilled nursing facility. She has home health that will be coming out to do OT and PT She would like for them to also do wound . care. 9/18; patient presents for follow-up. We have been using Medihoney and silver alginate under compression therapy. Home health is no longer coming out for wound care. She comes in  weekly for wrap changes in our office During nurse visits. 10/2; patient presents for follow-up. We have been using silver alginate under compression therapy. She has no issues or complaints today. She states she is doing her last chemoinfusion next week. 10/23; Patient presents for follow-up. We continue to use silver alginate under compression therapy. She has Bell few more wounds present today. She states she is taking her lasix as prescribed. She has completed her infusions for her SCC lesions. 11/16; patient presents for follow-up. At last clinic visit she had more drainage and irritation and I recommended adding gentamicin and mupirocin with ketoconazole under the dressing and wrap. T oday her wounds have improved in appearance and drainage. She has no issues or complaints today. 11/30; patient presents for follow-up. I had recommended increased frequency in wrap changes due to drainage and odor Over the past several clinic visits. Patient now has home health. They are coming out 3 times Bell week. Her wounds have improved significantly. She has no issues or complaints today. Patient History Information obtained from Patient. Family History Unknown History. Social History Never smoker, Marital Status - Single, Alcohol Use - Never, Drug Use - No History, Caffeine Use - Never. Medical History Eyes Denies history of Cataracts, Optic Neuritis Ear/Nose/Mouth/Throat Denies history of Chronic sinus problems/congestion, Middle ear problems Hematologic/Lymphatic Patient has history of Anemia Denies history of Hemophilia, Human Immunodeficiency Virus, Lymphedema, Sickle Cell Disease Respiratory Denies history of Aspiration, Asthma, Chronic Obstructive Pulmonary Disease (COPD), Pneumothorax, Sleep Apnea, Tuberculosis Cardiovascular Patient has history of Arrhythmia - Atrial Flutter, Bell fibb, Congestive Heart Failure, Hypertension Denies history of Angina, Coronary Artery Disease, Deep Vein  Thrombosis, Hypotension, Myocardial Infarction, Peripheral Arterial Disease, Peripheral Venous Disease, Phlebitis, Vasculitis Gastrointestinal Patient has history of Colitis Denies history of Cirrhosis , Crohnoos, Hepatitis Bell, Hepatitis B, Hepatitis C Endocrine Denies history of Type I Diabetes, Type II Diabetes Genitourinary Denies history of End Stage Renal Disease Immunological Denies history of Lupus Erythematosus, Raynaudoos, Scleroderma Integumentary (Skin) Denies history of History of Burn Musculoskeletal Patient has history of Osteoarthritis Denies history of Gout, Rheumatoid Arthritis, Osteomyelitis Neurologic Denies history of Dementia, Neuropathy, Quadriplegia, Paraplegia, Seizure Disorder Oncologic Denies history of Received Chemotherapy, Received Radiation Hospitalization/Surgery History - removal of rod left leg. 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 Vitals Time Taken: 1:34 PM, Height: 65 in, Weight: 163 lbs, BMI: 27.1, Temperature: 97.7 F, Pulse: 83 bpm,  Respiratory Rate: 18 breaths/min, Blood Pressure: 118/82 mmHg. Cardiovascular 2+ dorsalis pedis/posterior tibialis pulses. Psychiatric pleasant and cooperative. General Notes: Multiple scattered open wounds to her lower extremities bilaterally With granulation tissue and slough. No surrounding signs of infection. Good edema control. Lymphedema skin changes. No significant drainage or odor noted. Integumentary (Hair, Skin) Wound #20 status is Open. Original cause of wound was Gradually Appeared. The date acquired was: 06/25/2021. The wound has been in treatment 25 weeks. The wound is located on the  Bell,Circumferential Lower Leg. The wound measures 29cm length x 20cm width x 0.1cm depth; 455.531cm^2 area and 45.553cm^3 volume. There is Fat Layer (Subcutaneous Tissue) exposed. There is no tunneling or undermining noted. There is Bell large amount of serosanguineous drainage noted. Foul odor after cleansing was noted. The wound margin is distinct with the outline attached to the wound base. There is large (67-100%) red, pink granulation within the wound bed. There is Bell small (1-33%) amount of necrotic tissue within the wound bed including Adherent Slough. The periwound skin appearance exhibited: Maceration, Hemosiderin Staining. The periwound skin appearance did not exhibit: Callus, Crepitus, Excoriation, Induration, Rash, Scarring, Dry/Scaly, Atrophie Blanche, Cyanosis, Ecchymosis, Mottled, Pallor, Rubor, Erythema. Periwound temperature was noted as No Abnormality. The periwound has tenderness on palpation. Wound #7RR status is Open. Original cause of wound was Blister. The date acquired was: 04/20/2020. The wound has been in treatment 79 weeks. The wound is located on the Left,Circumferential Lower Leg. The wound measures 23cm length x 19cm width x 0.1cm depth; 343.219cm^2 area and 34.322cm^3 volume. There is Fat Layer (Subcutaneous Tissue) exposed. There is no tunneling or undermining noted. There is Bell large amount of serosanguineous drainage noted. Foul odor after cleansing was noted. The wound margin is distinct with the outline attached to the wound base. There is small (1-33%) red, pink granulation within the wound bed. There is Bell large (67-100%) amount of necrotic tissue within the wound bed including Adherent Slough. The periwound skin appearance exhibited: Maceration, Hemosiderin Staining. The periwound skin appearance did not exhibit: Callus, Crepitus, Excoriation, Induration, Rash, Scarring, Dry/Scaly, Atrophie Blanche, Cyanosis, Ecchymosis, Mottled, Pallor, Rubor, Erythema. Periwound  temperature was noted as No Abnormality. 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 She now has home health who changes the wrap 3 times weekly. This has significantly helped her edema and wound healing. I debrided nonviable tissue. I recommended continuing the course with silver alginate under compression therapy. Will add antibiotic ointment in office only. Procedures Wound #20 Pre-procedure diagnosis of Wound #20 is Bell Lymphedema located on the Bell,Circumferential Lower Leg . There was Bell Excisional Skin/Subcutaneous Tissue Debridement with Bell total area of 35 sq cm performed by [name] Shan, DO. With the following instrument(s): Curette to remove Viable and Non-Viable tissue/material. Material removed includes Subcutaneous Tissue and Slough and after achieving pain control using Lidocaine. No specimens were taken. Bell time out was conducted at 14:23, prior to the start of the procedure. Bell Minimum amount of bleeding was controlled with Pressure. The procedure was tolerated well with Bell pain level of 0 throughout and Bell pain level of 0 following the procedure. Post Debridement Measurements: 29cm length x 20cm width x 0.1cm  depth; 45.553cm^3 volume. Character of Wound/Ulcer Post Debridement is improved. Post procedure Diagnosis Wound #20: Same as Pre-Procedure [name], [name] ([id number]) [phone].pdf Page 11 of 14 Pre-procedure diagnosis of Wound #20 is Bell Lymphedema located on the Bell,Circumferential Lower Leg . There was Bell Three Layer Compression Therapy Procedure by [name] Hammock, RN. Post procedure Diagnosis Wound #20: Same as Pre-Procedure Wound #7RR Pre-procedure diagnosis of Wound #7RR is Bell Malignant Wound located on the Left,Circumferential Lower Leg . There was Bell Three Layer Compression Therapy Procedure by [name] Hammock, RN. Post procedure Diagnosis Wound #7RR: Same as Pre-Procedure Plan Follow-up Appointments: Return Appointment in 2 weeks. - w/ Dr. Heber [place] Bathing/ Shower/ Hygiene: May shower with protection but do not get wound dressing(s) wet. May shower and wash wound with soap and water. - with dressing changes may wash wounds with soap and water. Edema Control - Lymphedema / SCD / Other: Lymphedema Pumps. Use Lymphedema pumps on leg(s) 2-3 times Bell day for 45-60 minutes. If wearing any wraps or hose, do not remove them. Continue exercising as instructed. - use one hour each time twice Bell day. Elevate legs to the level of the heart or above for 30 minutes daily and/or when sitting, Bell frequency of: - elevate the legs throughout the day heart level if possible. Avoid standing for long periods of time. Exercise regularly Additional Orders / Instructions: Follow Nutritious Diet Home Health: Admit to Home Health for skilled nursing wound care. May utilize formulary equivalent dressing for wound treatment orders unless otherwise specified. - [employer] to change 3 x Bell week WOUND #20: - Lower Leg Wound Laterality: Bell, Circumferential Cleanser: Soap and Water 1 x Per Week/30 Days Discharge Instructions: May shower and wash wound with dial antibacterial soap and water prior to dressing change. Cleanser: Wound Cleanser 1 x Per Week/30 Days Discharge Instructions: Cleanse the wound with wound cleanser prior to applying Bell clean dressing using gauze sponges, not tissue or cotton balls. Peri-Wound Care: cetaphil lotion 1 x Per Week/30 Days Discharge Instructions: patient to provide to nurses to apply to both legs. Topical: Gentamicin 1 x Per Week/30  Days Discharge Instructions: As directed by physician Topical: Mupirocin Ointment 1 x Per Week/30 Days Discharge Instructions: Apply Mupirocin (Bactroban) as instructed Prim Dressing: KerraCel Ag Gelling Fiber Dressing, 4x5 in (silver alginate) 1 x Per Week/30 Days ary Discharge Instructions: Apply silver alginate to wound bed as instructed Secondary Dressing: ABD Pad, 8x10 1 x Per Week/30 Days Discharge Instructions: Apply over primary dressing as directed. Com pression Wrap: ThreePress (3 layer compression wrap) 1 x Per Week/30 Days Discharge Instructions: Apply three layer compression as directed. ***PLEASE USE KERLIX INSTEAD OF COTTON.*** WOUND #7RR: - Lower Leg Wound Laterality: Left, Circumferential Cleanser: Soap and Water 1 x Per Week/30 Days Discharge Instructions: May shower and wash wound with dial antibacterial soap and water prior to dressing change. Cleanser: Wound Cleanser 1 x Per Week/30 Days Discharge Instructions: Cleanse the wound with wound cleanser prior to applying Bell clean dressing using gauze sponges, not tissue or cotton balls. Peri-Wound Care: cetaphil lotion 1 x Per Week/30 Days Discharge Instructions: patient to provide to nurses to apply to both legs. Topical: Gentamicin 1 x Per Week/30 Days Discharge Instructions: As directed by physician Topical: Mupirocin Ointment 1 x Per Week/30 Days Discharge Instructions: Apply Mupirocin (Bactroban) as instructed Prim Dressing: KerraCel Ag Gelling Fiber Dressing, 4x5 in (silver alginate) 1 x Per Week/30 Days ary Discharge Instructions: Apply silver alginate to wound bed as instructed Secondary Dressing:  ABD Pad, 8x10 1 x Per Week/30 Days Discharge Instructions: Apply over primary dressing as directed. Com pression Wrap: ThreePress (3 layer compression wrap) 1 x Per Week/30 Days Discharge Instructions: Apply three layer compression as directed. ***PLEASE USE KERLIX INSTEAD OF COTTON.*** 1. In office sharp debridement 2.  Silver alginate with antibiotic ointment under 3 layer compression to the lower extremities bilaterally 3. Follow-up in 1 week Electronic Signature(s) Signed: 12/19/2021 4:36:48 PM By: [name] Shan DO Entered By: [name] on 12/19/2021 15:41:32 [place], [place] Bell ([id number]) [id number] Page 12 of 14 -------------------------------------------------------------------------------- HxROS Details Patient Name: Date of Service: [name], [place] 12/19/2021 1:15 PM Medical Record Number: [id number] Patient Account Number: [id number] Date of Birth/Sex: Treating RN: [birth year] (88 y.o. F) Primary Care Provider: [name] Other Clinician: Referring Provider: Treating Provider/Extender: [name] in Treatment: 29 Information Obtained From Patient Eyes Medical History: Negative for: Cataracts; Optic Neuritis Ear/Nose/Mouth/Throat Medical History: Negative for: Chronic sinus problems/congestion; Middle ear problems Hematologic/Lymphatic Medical History: Positive for: Anemia Negative for: Hemophilia; Human Immunodeficiency Virus; Lymphedema; Sickle Cell Disease Respiratory Medical History: Negative for: Aspiration; Asthma; Chronic Obstructive Pulmonary Disease (COPD); Pneumothorax; Sleep Apnea; Tuberculosis Cardiovascular Medical History: Positive for: Arrhythmia - Atrial Flutter, Bell fibb; Congestive Heart Failure; Hypertension Negative for: Angina; Coronary Artery Disease; Deep Vein Thrombosis; Hypotension; Myocardial Infarction; Peripheral Arterial Disease; Peripheral Venous Disease; Phlebitis; Vasculitis Past Medical History Notes: hyperlipidemia Gastrointestinal Medical History: Positive for: Colitis Negative for: Cirrhosis ; Crohns; Hepatitis Bell; Hepatitis B; Hepatitis C Endocrine Medical History: Negative for: Type I Diabetes; Type II Diabetes Past Medical History  Notes: hypothyroidism Genitourinary Medical History: Negative for: End Stage Renal Disease Immunological Medical History: Negative for: Lupus Erythematosus; Raynauds; Scleroderma Integumentary (Skin) Medical History: Negative for: History of Burn Musculoskeletal Medical HistorySAVAHANNA, ALMENDARIZ ([id number]) [phone].pdf Page 13 of 14 Positive for: Osteoarthritis Negative for: Gout; Rheumatoid Arthritis; Osteomyelitis Neurologic Medical History: Negative for: Dementia; Neuropathy; Quadriplegia; Paraplegia; Seizure Disorder Past Medical History Notes: lumbar spindylolysis Oncologic Medical History: Negative for: Received Chemotherapy; Received Radiation Past Medical History Notes: BLE squamous ceel carcionoma Immunizations Pneumococcal Vaccine: Received Pneumococcal Vaccination: Yes Received Pneumococcal Vaccination On or After 60th Birthday: No Implantable Devices None Hospitalization / Surgery History Type of Hospitalization/Surgery removal of rod left leg Family and Social History Unknown History: Yes; Never smoker; Marital Status - Single; Alcohol Use: Never; Drug Use: No History; Caffeine Use: Never; Financial Concerns: No; Food, Clothing or Shelter Needs: No; Support System Lacking: No; Transportation Concerns: No Electronic Signature(s) Signed: 12/19/2021 4:36:48 PM By: [name] Shan DO Entered By: [name] on 12/19/2021 15:38:31 -------------------------------------------------------------------------------- SuperBill Details Patient Name: Date of Service: [name], [place] Bell. 12/19/2021 Medical Record Number: [id number] Patient Account Number: [id number] Date of Birth/Sex: Treating RN: [birth year] (88 y.o. 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 L97.822 Non-pressure chronic ulcer of other part of left lower leg with fat layer exposed I89.0 Lymphedema, not elsewhere classified I87.313 Chronic venous hypertension (idiopathic) with ulcer of bilateral lower extremity Facility Procedures : CPT4 Code: 14431540 Description: 08676 - DEB SUBQ TISSUE 20 SQ CM/< ICD-10 Diagnosis Description P95.093 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

## 2021-12-24 DIAGNOSIS — C44729 Squamous cell carcinoma of skin of left lower limb, including hip: Secondary | ICD-10-CM | POA: Diagnosis not present

## 2021-12-24 DIAGNOSIS — I87313 Chronic venous hypertension (idiopathic) with ulcer of bilateral lower extremity: Secondary | ICD-10-CM | POA: Diagnosis not present

## 2021-12-24 DIAGNOSIS — L97812 Non-pressure chronic ulcer of other part of right lower leg with fat layer exposed: Secondary | ICD-10-CM | POA: Diagnosis not present

## 2021-12-24 DIAGNOSIS — C44722 Squamous cell carcinoma of skin of right lower limb, including hip: Secondary | ICD-10-CM | POA: Diagnosis not present

## 2021-12-24 DIAGNOSIS — L97822 Non-pressure chronic ulcer of other part of left lower leg with fat layer exposed: Secondary | ICD-10-CM | POA: Diagnosis not present

## 2021-12-24 DIAGNOSIS — I130 Hypertensive heart and chronic kidney disease with heart failure and stage 1 through stage 4 chronic kidney disease, or unspecified chronic kidney disease: Secondary | ICD-10-CM | POA: Diagnosis not present

## 2021-12-26 ENCOUNTER — Encounter (HOSPITAL_BASED_OUTPATIENT_CLINIC_OR_DEPARTMENT_OTHER): Payer: Medicare Other | Admitting: Internal Medicine

## 2021-12-27 DIAGNOSIS — C44722 Squamous cell carcinoma of skin of right lower limb, including hip: Secondary | ICD-10-CM | POA: Diagnosis not present

## 2021-12-27 DIAGNOSIS — C44729 Squamous cell carcinoma of skin of left lower limb, including hip: Secondary | ICD-10-CM | POA: Diagnosis not present

## 2021-12-27 DIAGNOSIS — L97822 Non-pressure chronic ulcer of other part of left lower leg with fat layer exposed: Secondary | ICD-10-CM | POA: Diagnosis not present

## 2021-12-27 DIAGNOSIS — I130 Hypertensive heart and chronic kidney disease with heart failure and stage 1 through stage 4 chronic kidney disease, or unspecified chronic kidney disease: Secondary | ICD-10-CM | POA: Diagnosis not present

## 2021-12-27 DIAGNOSIS — I87313 Chronic venous hypertension (idiopathic) with ulcer of bilateral lower extremity: Secondary | ICD-10-CM | POA: Diagnosis not present

## 2021-12-27 DIAGNOSIS — L97812 Non-pressure chronic ulcer of other part of right lower leg with fat layer exposed: Secondary | ICD-10-CM | POA: Diagnosis not present

## 2021-12-31 ENCOUNTER — Encounter: Payer: Self-pay | Admitting: Internal Medicine

## 2021-12-31 ENCOUNTER — Ambulatory Visit (INDEPENDENT_AMBULATORY_CARE_PROVIDER_SITE_OTHER): Payer: Medicare Other | Admitting: Internal Medicine

## 2021-12-31 VITALS — BP 118/62 | HR 64 | Temp 97.7°F | Ht 65.0 in | Wt 149.4 lb

## 2021-12-31 DIAGNOSIS — L03119 Cellulitis of unspecified part of limb: Secondary | ICD-10-CM | POA: Diagnosis not present

## 2021-12-31 DIAGNOSIS — L02419 Cutaneous abscess of limb, unspecified: Secondary | ICD-10-CM

## 2021-12-31 DIAGNOSIS — I130 Hypertensive heart and chronic kidney disease with heart failure and stage 1 through stage 4 chronic kidney disease, or unspecified chronic kidney disease: Secondary | ICD-10-CM | POA: Diagnosis not present

## 2021-12-31 DIAGNOSIS — N183 Chronic kidney disease, stage 3 unspecified: Secondary | ICD-10-CM | POA: Diagnosis not present

## 2021-12-31 DIAGNOSIS — I4891 Unspecified atrial fibrillation: Secondary | ICD-10-CM | POA: Diagnosis not present

## 2021-12-31 DIAGNOSIS — L97822 Non-pressure chronic ulcer of other part of left lower leg with fat layer exposed: Secondary | ICD-10-CM | POA: Diagnosis not present

## 2021-12-31 DIAGNOSIS — C44722 Squamous cell carcinoma of skin of right lower limb, including hip: Secondary | ICD-10-CM | POA: Diagnosis not present

## 2021-12-31 DIAGNOSIS — I87313 Chronic venous hypertension (idiopathic) with ulcer of bilateral lower extremity: Secondary | ICD-10-CM | POA: Diagnosis not present

## 2021-12-31 DIAGNOSIS — L97812 Non-pressure chronic ulcer of other part of right lower leg with fat layer exposed: Secondary | ICD-10-CM | POA: Diagnosis not present

## 2021-12-31 DIAGNOSIS — C44729 Squamous cell carcinoma of skin of left lower limb, including hip: Secondary | ICD-10-CM | POA: Diagnosis not present

## 2021-12-31 LAB — CBC WITH DIFFERENTIAL/PLATELET
Basophils Absolute: 0.1 10*3/uL (ref 0.0–0.1)
Basophils Relative: 0.9 % (ref 0.0–3.0)
Eosinophils Absolute: 0.3 10*3/uL (ref 0.0–0.7)
Eosinophils Relative: 3.9 % (ref 0.0–5.0)
HCT: 35.1 % — ABNORMAL LOW (ref 36.0–46.0)
Hemoglobin: 11.6 g/dL — ABNORMAL LOW (ref 12.0–15.0)
Lymphocytes Relative: 16.6 % (ref 12.0–46.0)
Lymphs Abs: 1.5 10*3/uL (ref 0.7–4.0)
MCHC: 33.1 g/dL (ref 30.0–36.0)
MCV: 103.2 fl — ABNORMAL HIGH (ref 78.0–100.0)
Monocytes Absolute: 1.2 10*3/uL — ABNORMAL HIGH (ref 0.1–1.0)
Monocytes Relative: 13.4 % — ABNORMAL HIGH (ref 3.0–12.0)
Neutro Abs: 5.8 10*3/uL (ref 1.4–7.7)
Neutrophils Relative %: 65.2 % (ref 43.0–77.0)
Platelets: 424 10*3/uL — ABNORMAL HIGH (ref 150.0–400.0)
RBC: 3.4 Mil/uL — ABNORMAL LOW (ref 3.87–5.11)
RDW: 14.7 % (ref 11.5–15.5)
WBC: 8.9 10*3/uL (ref 4.0–10.5)

## 2021-12-31 LAB — COMPREHENSIVE METABOLIC PANEL
ALT: 16 U/L (ref 0–35)
AST: 34 U/L (ref 0–37)
Albumin: 3.5 g/dL (ref 3.5–5.2)
Alkaline Phosphatase: 84 U/L (ref 39–117)
BUN: 34 mg/dL — ABNORMAL HIGH (ref 6–23)
CO2: 30 mEq/L (ref 19–32)
Calcium: 9.3 mg/dL (ref 8.4–10.5)
Chloride: 95 mEq/L — ABNORMAL LOW (ref 96–112)
Creatinine, Ser: 1.77 mg/dL — ABNORMAL HIGH (ref 0.40–1.20)
GFR: 24.36 mL/min — ABNORMAL LOW (ref >60.00)
Glucose, Bld: 116 mg/dL — ABNORMAL HIGH (ref 70–99)
Potassium: 4.5 mEq/L (ref 3.5–5.1)
Sodium: 133 mEq/L — ABNORMAL LOW (ref 135–145)
Total Bilirubin: 0.4 mg/dL (ref 0.2–1.2)
Total Protein: 7.9 g/dL (ref 6.0–8.3)

## 2021-12-31 MED ORDER — NUZYRA 150 MG PO TABS: ORAL_TABLET | ORAL | 0 refills | Status: DC

## 2021-12-31 NOTE — Assessment & Plan Note (Signed)
Rate controlled On Eliquis, Toprol XL, Diltiazem

## 2021-12-31 NOTE — Assessment & Plan Note (Addendum)
B legs chronic leg wounds, infection-worse Will start Nuzyra.  Arrangements were made for coverage. See recent photos in the note

## 2021-12-31 NOTE — Progress Notes (Signed)
Subjective:  Patient ID: [name], female    DOB: Aug 22, 1925  Age: 88 y.o. MRN: [id number]  CC: Follow-up (3 month f/u)   HPI [name] presents for leg wounds - not better; wound d/c started to smell. On Doxy. Here w/dtr [name]. F/u HTN, LBP         Outpatient Medications Prior to Visit  Medication Sig Dispense Refill   acetaminophen (TYLENOL) 500 MG tablet Take 500 mg by mouth daily as needed for mild pain.      apixaban (ELIQUIS) 5 MG TABS tablet Take 1 tablet (5 mg total) by mouth 2 (two) times daily. 60 tablet 10   B Complex-C (B-COMPLEX WITH VITAMIN C) tablet Take 1 tablet by mouth daily.     busPIRone (BUSPAR) 15 MG tablet TAKE 1 TABLET BY MOUTH THREE TIMES A DAY 90 tablet 3   CEMIPLIMAB-RWLC IV Inject 1 Dose into the vein every 21 ( twenty-one) days.     Cholecalciferol (VITAMIN D3) 1000 UNITS tablet Take 1,000 Units by mouth daily.     diltiazem (CARDIZEM CD) 120 MG 24 hr capsule TAKE 1 CAPSULE BY MOUTH ONCE DAILY 30 capsule 11   doxycycline (VIBRA-TABS) 100 MG tablet TAKE 1 TABLET BY MOUTH ONCE DAILY 30 tablet 10   famotidine (PEPCID) 20 MG tablet Take 20 mg by mouth daily.     furosemide (LASIX) 40 MG tablet TAKE 1 TABLET BY MOUTH TWICE DAILY 60 tablet 5   gabapentin (NEURONTIN) 100 MG capsule TAKE 1  CAPSULE BY MOUTH EVERY NIGHT AT BEDTIME *DO NOT CRUSH OR CHEW* 30 capsule 5   hydrocortisone 2.5 % lotion Apply topically 3 (three) times daily. 240 mL 2   levothyroxine (SYNTHROID) 25 MCG tablet Take 1 tablet (25 mcg total) by mouth daily before breakfast. 30 tablet 0   lidocaine-prilocaine (EMLA) cream Apply 1 Application topically as needed. As directed 1 h before procedure 30 g 2   metoprolol succinate (TOPROL-XL) 50 MG 24 hr tablet TAKE 1/2 TABLET = 25 MG BY MOUTH TWICE DAILY. TAKE WITH OR IMMEDIATELY FOLLOWING A MEAL 30 tablet 10   Multiple Vitamins-Minerals (MULTIVITAMIN WITH MINERALS) tablet Take 1 tablet by mouth daily.     polyethylene glycol (MIRALAX) 17  g packet Take 17 g by mouth daily. 30 each 0   potassium chloride SA (KLOR-CON M) 20 MEQ tablet TAKE 1 TABLET BY MOUTH ONCE DAILY  ALONG W/ FUROSEMIDE AS NEED *TAKE WITH FOOD* *DO NOT CRUSH OR CHEW* *MAY DISSOLVE* 30 tablet 11   temazepam (RESTORIL) 15 MG capsule TAKE 1 CAPSULE BY MOUTH AT BEDTIME AS NEEDED FOR SLEEP 30 capsule 5   colestipol (COLESTID) 1 g tablet Take 1 tablet (1 g total) by mouth in the morning. (Patient not taking: Reported on 09/17/2021) 60 tablet 12   No facility-administered medications prior to visit.    ROS: Review of Systems  Constitutional:  Negative for activity change, appetite change, chills, fatigue and unexpected weight change.  HENT:  Negative for congestion, mouth sores and sinus pressure.   Eyes:  Negative for visual disturbance.  Respiratory:  Negative for cough and chest tightness.   Gastrointestinal:  Negative for abdominal pain and nausea.  Genitourinary:  Negative for difficulty urinating, frequency and vaginal pain.  Musculoskeletal:  Negative for back pain and gait problem.  Skin:  Positive for color change, rash and wound. Negative for pallor.  Neurological:  Positive for weakness. Negative for dizziness, tremors, numbness and headaches.  Psychiatric/Behavioral:  Negative  for confusion, sleep disturbance and suicidal ideas. The patient is nervous/anxious.     Objective:  BP 118/62 (BP Location: Left Arm)   Pulse 64   Temp 97.7 F (36.5 C) (Oral)   Ht '5\' 5"'$  (1.651 m)   Wt 149 lb 6.4 oz (67.8 kg)   SpO2 98%   BMI 24.86 kg/m   BP Readings from Last 3 Encounters:  12/31/21 118/62  10/24/21 (!) 113/52  10/24/21 (!) 120/58    Wt Readings from Last 3 Encounters:  12/31/21 149 lb 6.4 oz (67.8 kg)  12/02/21 151 lb (68.5 kg)  10/24/21 150 lb 11.2 oz (68.4 kg)    Physical Exam Constitutional:      General: She is not in acute distress.    Appearance: Normal appearance. She is well-developed.  HENT:     Head: Normocephalic.     Right  Ear: External ear normal.     Left Ear: External ear normal.     Nose: Nose normal.  Eyes:     General:        Right eye: No discharge.        Left eye: No discharge.     Conjunctiva/sclera: Conjunctivae normal.     Pupils: Pupils are equal, round, and reactive to light.  Neck:     Thyroid: No thyromegaly.     Vascular: No JVD.     Trachea: No tracheal deviation.  Cardiovascular:     Rate and Rhythm: Normal rate and regular rhythm.     Heart sounds: Normal heart sounds.  Pulmonary:     Effort: No respiratory distress.     Breath sounds: No stridor. No wheezing.  Abdominal:     General: Bowel sounds are normal. There is no distension.     Palpations: Abdomen is soft. There is no mass.     Tenderness: There is no abdominal tenderness. There is no guarding or rebound.  Musculoskeletal:        General: No tenderness.     Cervical back: Normal range of motion and neck supple. No rigidity.  Lymphadenopathy:     Cervical: No cervical adenopathy.  Skin:    Findings: Bruising, erythema, lesion and rash present.  Neurological:     Mental Status: She is oriented to person, place, and time.     Cranial Nerves: No cranial nerve deficit.     Motor: No abnormal muscle tone.     Coordination: Coordination abnormal.     Gait: Gait abnormal.     Deep Tendon Reflexes: Reflexes normal.  Psychiatric:        Behavior: Behavior normal.        Thought Content: Thought content normal.        Judgment: Judgment normal.   In a w/c Lower legs are dressed    A total time of 45 minutes was spent preparing to see the patient, reviewing tests, x-rays, operative reports and other medical records.  Also, obtaining history and performing comprehensive physical exam.  Additionally, counseling the patient regarding the above listed issues.   Finally, documenting clinical information in the health records, coordination of care of leg wounds, Nuzyra Rx. It is a complex case.   Lab Results  Component Value  Date   WBC 8.9 12/31/2021   HGB 11.6 (L) 12/31/2021   HCT 35.1 (L) 12/31/2021   PLT 424.0 (H) 12/31/2021   GLUCOSE 116 (H) 12/31/2021   CHOL 116 06/25/2012   TRIG 82 09/05/2013   HDL 41.50 06/25/2012  LDLDIRECT 110.3 05/24/2008   LDLCALC 66 06/25/2012   ALT 16 12/31/2021   AST 34 12/31/2021   NA 133 (L) 12/31/2021   K 4.5 12/31/2021   CL 95 (L) 12/31/2021   CREATININE 1.77 (H) 12/31/2021   BUN 34 (H) 12/31/2021   CO2 30 12/31/2021   TSH 0.635 10/24/2021   INR 1.33 05/23/2017   HGBA1C 6.1 02/05/2021    VAS [place] LOWER EXTREMITY VENOUS REFLUX  Result Date: 03/25/2021  Lower Venous Reflux Study Patient Name:  [name]  Date of Exam:   03/25/2021 Medical Rec #: [id number]      Accession #:    [id number] Date of Birth: [birth year]     Patient Gender: F Patient Age:   22 years Exam Location:  [facility] Procedure:      VAS [place] LOWER EXTREMITY VENOUS REFLUX Referring Phys: JESSICA HOFFMAN --------------------------------------------------------------------------------  Indications: Wounds.  Limitations: Difficulty with valsalva and pain with distal augmentation. Performing Technologist: Ralene Cork RVT  Examination Guidelines: A complete evaluation includes B-mode imaging, spectral Doppler, color Doppler, and power Doppler as needed of all accessible portions of each vessel. Bilateral testing is considered an integral part of a complete examination. Limited examinations for reoccurring indications may be performed as noted. The reflux portion of the exam is performed with the patient in reverse Trendelenburg. Significant venous reflux is defined as >500 ms in the superficial venous system, and >1 second in the deep venous system.  Venous Reflux Times +--------------+---------+------+-----------+------------+--------+ RIGHT         Reflux NoRefluxReflux TimeDiameter cmsComments                         Yes                                   +--------------+---------+------+-----------+------------+--------+ CFV                     yes   >1 second                      +--------------+---------+------+-----------+------------+--------+ FV prox       no                                             +--------------+---------+------+-----------+------------+--------+ FV mid        no                                             +--------------+---------+------+-----------+------------+--------+ FV dist       no                                             +--------------+---------+------+-----------+------------+--------+ Popliteal     no                                             +--------------+---------+------+-----------+------------+--------+ GSV at [facility]    no  0.774             +--------------+---------+------+-----------+------------+--------+ GSV prox thigh          yes    >500 ms     0.661             +--------------+---------+------+-----------+------------+--------+ GSV mid thigh no                           0.712             +--------------+---------+------+-----------+------------+--------+ GSV dist thighno                           0.593             +--------------+---------+------+-----------+------------+--------+ GSV at knee   no                           0.546             +--------------+---------+------+-----------+------------+--------+ GSV prox calf no                           0.591             +--------------+---------+------+-----------+------------+--------+ SSV Pop Fossa           yes    >500 ms     0.411             +--------------+---------+------+-----------+------------+--------+ SSV prox calf           yes    >500 ms     0.431             +--------------+---------+------+-----------+------------+--------+ SSV mid calf                                        wound     +--------------+---------+------+-----------+------------+--------+  Summary: Right: - No evidence of deep vein thrombosis seen in the right lower extremity, from the common femoral through the popliteal veins. - No evidence of superficial venous thrombosis in the right lower extremity.  - Deep vein reflux in the CFV. - Superficial vein reflux in the SSV and proximal GSV. *See table(s) above for measurements and observations. Electronically signed by Harold Barban MD on 03/25/2021 at 6:56:00 PM.    Final     Assessment & Plan:   Problem List Items Addressed This Visit     CRI (chronic renal insufficiency), stage 3 (moderate) (HCC)    Hydrate well  Monitor GFR      Relevant Orders   Comprehensive metabolic panel (Completed)   Cellulitis and abscess of leg - Primary    B legs chronic leg wounds, infection-worse Will start Nuzyra.  Arrangements were made for coverage.      Relevant Orders   Comprehensive metabolic panel (Completed)   CBC with Differential/Platelet (Completed)   Atrial fibrillation (HCC)    Rate controlled On Eliquis, Toprol XL, Diltiazem         Meds ordered this encounter  Medications   Omadacycline Tosylate (NUZYRA) 150 MG TABS    Sig: Take 450 mg qd x 2 days (samples), then 300 mg daily x 14 days total    Dispense:  24 tablet    Refill:  0    Attn Britney Call dtr [name] [phone]  Follow-up: Return in about 6 weeks (around 02/11/2022) for a follow-up visit.  Walker Kehr, MD

## 2021-12-31 NOTE — Assessment & Plan Note (Signed)
Hydrate well ?Monitor GFR ?

## 2022-01-01 ENCOUNTER — Telehealth: Payer: Self-pay

## 2022-01-01 ENCOUNTER — Telehealth: Payer: Self-pay | Admitting: Internal Medicine

## 2022-01-01 NOTE — Telephone Encounter (Signed)
[name] - from [place] has been denied due to the fact that she hasn't been on 3 other formulary drugs  This can be contested by faxing:  [phone]

## 2022-01-01 NOTE — Telephone Encounter (Signed)
Britney w/[place], [place] [facility] will work on it [employer]

## 2022-01-01 NOTE — Telephone Encounter (Signed)
PA has been started.  Key: Georgina Quint

## 2022-01-01 NOTE — Telephone Encounter (Signed)
Rrec'd determination PA was denied.Marland KitchenJohny Chess

## 2022-01-02 ENCOUNTER — Encounter (HOSPITAL_BASED_OUTPATIENT_CLINIC_OR_DEPARTMENT_OTHER): Payer: Medicare Other | Attending: Internal Medicine | Admitting: Internal Medicine

## 2022-01-02 ENCOUNTER — Other Ambulatory Visit: Payer: Self-pay

## 2022-01-02 DIAGNOSIS — I872 Venous insufficiency (chronic) (peripheral): Secondary | ICD-10-CM | POA: Insufficient documentation

## 2022-01-02 DIAGNOSIS — I110 Hypertensive heart disease with heart failure: Secondary | ICD-10-CM | POA: Diagnosis not present

## 2022-01-02 DIAGNOSIS — C4492 Squamous cell carcinoma of skin, unspecified: Secondary | ICD-10-CM | POA: Diagnosis not present

## 2022-01-02 DIAGNOSIS — L97812 Non-pressure chronic ulcer of other part of right lower leg with fat layer exposed: Secondary | ICD-10-CM | POA: Insufficient documentation

## 2022-01-02 DIAGNOSIS — E039 Hypothyroidism, unspecified: Secondary | ICD-10-CM | POA: Insufficient documentation

## 2022-01-02 DIAGNOSIS — I509 Heart failure, unspecified: Secondary | ICD-10-CM | POA: Insufficient documentation

## 2022-01-02 DIAGNOSIS — L97822 Non-pressure chronic ulcer of other part of left lower leg with fat layer exposed: Secondary | ICD-10-CM | POA: Diagnosis not present

## 2022-01-02 DIAGNOSIS — I890 Lymphedema, not elsewhere classified: Secondary | ICD-10-CM | POA: Insufficient documentation

## 2022-01-02 DIAGNOSIS — I87313 Chronic venous hypertension (idiopathic) with ulcer of bilateral lower extremity: Secondary | ICD-10-CM

## 2022-01-02 NOTE — Progress Notes (Signed)
[name], [name] ([id number]) [id number] Page 1 of 14 Visit Report for 01/02/2022 Biopsy Details Patient Name: Date of Service: [name], [name]. 01/02/2022 1:15 PM Medical Record Number: [id number] Patient Account Number: [id number] Date of Birth/Sex: Treating RN: 02-18-1925 (88 y.o. F) Primary Care Provider: [name] Other Clinician: Referring Provider: Treating Provider/Extender: [name] in Treatment: 42 Biopsy Performed for: Wound #20 [name], Circumferential Lower Leg Location(s): Wound Bed Performed By: Physician [name] Shan, [name] Tissue Punch: Yes Size (mm): 5 Number of Specimens T aken: 1 Specimen Sent T Pathology: o Yes Level of Consciousness (Pre-procedure): Awake and Alert Pre-procedure Verification/Time-Out Taken: Yes - 15:00 Pain Control: Lidocaine Injectable Lidocaine Percent: 1% Instrument: Forceps, Scissors Bleeding: Moderate Hemostasis Achieved: Surgicel Procedural Pain: 0 Post Procedural Pain: 0 Response to Treatment: Procedure was tolerated well Level of Consciousness (Post-procedure): Awake and Alert Post Procedure Diagnosis Same as Pre-procedure Electronic Signature(s) Signed: 01/02/2022 4:17:22 PM By: [name] Signed: 01/02/2022 5:16:42 PM By: [name] Entered By: [name] on 01/02/2022 15:04:41 -------------------------------------------------------------------------------- Chief Complaint Document Details Patient Name: Date of Service: [name], [name]. 01/02/2022 1:15 PM Medical Record Number: [id number] Patient Account Number: [id number] Date of Birth/Sex: Treating RN: 1925/08/08 (88 y.o. F) Primary Care Provider: [name] Other Clinician: Referring Provider: Treating Provider/Extender: [name] in Treatment: 78 Information Obtained from: Patient Chief Complaint Bilateral lower extremity wounds that  have been biopsied and positive for squamous cell carcinoma Bilateral lower extremity wounds [name] to chronic venous insufficiency/lymphedema Electronic Signature(s) Signed: 01/02/2022 5:16:42 PM By: [name] Entered By: [name] on 01/02/2022 15:35:54 [name], [name] ([id number]) [id number] Page 2 of 14 -------------------------------------------------------------------------------- HPI Details Patient Name: Date of Service: [name], [name]. 01/02/2022 1:15 PM Medical Record Number: [id number] Patient Account Number: [id number] Date of Birth/Sex: Treating RN: 1925/10/20 (88 y.o. 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 She states that the wound on the [name] has been present for [name] year. The 1 on the left has opened up 1 month ago. She is followed with oncology for this issue as she had biopsies that showed squamous cell carcinoma. She is also seeing radiation oncology for treatment options. She presents today because she would like for her wounds to be healed by [place]. She currently denies signs of infection. 6/1; patient presents for 1 week follow-up. She states she has tolerated the leg wraps well. She states these [name] not bother her and is happy to continue with them. She is scheduled to see her oncologist today to go over treatment options for the bilateral lower extremity squamous cell carcinoma. Radiation is currently not [name] recommended option. Patient states she  overall feels well. 6/22; patient presents for 3-week follow-up. She has tolerated the wraps well until her last wrap where she states they were uncomfortable. She attributes this to the home health nurse. She denies signs of infection. She has started her first treatment of antibody infusions for her Bilateral lower extremity squamous cell carcinoma. She has no complaints today. 7/21; patient presents for 1 month follow-up. Unfortunately  she has not had good experience with her wrap changes with home health. She would like to [name] her own dressing changes. She continues to [name] her antibody infusions. She denies signs of infection. 7/28; patient presents for 1 week follow-up. At last clinic visit she was switched to daily dressing changes [name] to issues with the wrap and home health placing them. Unfortunately she has developed weeping to her legs bilaterally. She would like to be placed in wraps today. She would also like to follow with [place] weekly for wrap changes instead of having home health change them. She denies signs of infection. 8/4; patient presents for 1 week follow-up. She has tolerated the Kerlix/Coban wraps well. She no longer has weeping to her legs. She took the wrap off 1 day before coming in to be able to take [name] shower. She has no issues or complaints today. She denies signs of infection. 8/18; patient presents for follow-up. Patient has tolerated the wraps well. She brought her Velcro compression wraps today. She has no issues or complaints today. She had her chemotherapy infusion yesterday without issues. She denies signs of infection. 8/25; patient presents for follow-up. She used her juxta lite compressions for the past week. It is unclear if she is able to put these on correctly since she states she has [name] hard time getting them to look [name]. She reports 2 open wounds. She currently denies signs of infection. 9/1; patient presents for 1 week follow-up. She has 1 open wound. She  tolerated the compression wraps well. She currently denies signs of infection. 9/8; patient presents for 1 week follow-up. She has 2 open wounds 1 on each leg. She has tolerated the compression wrap well. She currently denies signs of infection. 9/15; patient presents for 1 week follow-up. She now has 3 wounds. 2 on the left and 1 on the [name]. She continues to tolerate the compression wrap well. She currently denies signs of infection. She has obtained furosemide by her primary care physician and would like to discuss when to take this. 9/22; patient presents for 1 week follow-up. She has scattered wounds on her lower extremities bilaterally. She did take furosemide twice in the past week. She does not recall having to urinate more frequently. She tolerated the 3 layer compression well. She denies signs of infection. 10/13; patient has not been here recently because of [place]. Apparently the facility was only putting gauze on her legs. This is [name] patient I [name] not normally see. She has [name] history of squamous cell carcinoma bilaterally on her anterior lower legs followed for [name] period of time by Dr. Ronnald Ramp at [facility] dermatology. She is quite convincing that she did not have radiation to her lower legs. It is likely she also has significant chronic venous insufficiency stasis dermatitis. We have been using silver alginate under kerlix Coban. She has obvious open areas medially on the [name] and areas on the left. She also has areas of extensive dry flaking adherent areas on the [name] and to [name] lesser extent on the left anterior. Nodular areas on the left lateral lower leg left posterior calf and [name] mid calf medially. 10/21; patient presents for follow-up. She has no issues or complaints today. She has tolerated the 3 layer compression well bilaterally. She denies signs of infection. 10/27; patient presents for follow-up. She continues to tolerate the 3 layer compression wrap well. She denies signs of  infection. 11/30; patient presents for follow-up. She has no issues or complaints today. 11/10;  patient arrived in clinic today for nurse visit accompanied by her daughter from [place]. The daughter had multiple questions so we turned this into doctors visit. Apparently after the last time I saw this woman in October she went to see Dr. Ronnald Ramp but he did not take the wraps off. She previously was treated with Cemiplimab for her squamous cell carcinoma. She did not receive radiation to her legs. I had wanted Dr. Ronnald Ramp to look at this because of the exceptionally damaged skin on her lower legs bilaterally. She has odd looking wounds on the left medial lower leg also extending posteriorly which we have not made [name] lot of progress on. But she also has [name] raised hyperkeratotic nodules on the [name] anterior tibial area plaques on the left medial lower thigh. These are not areas that are under compression. We have been using silver alginate on any open areas Liberal TCA under 3 layer compression. 11/17; patient presents for follow-up. She had her wraps taken off yesterday for her dermatology appointment. She reports excessive weeping to her legs bilaterally after the wraps were taken off. She currently denies signs of infection. 12/12/2020 upon evaluation today patient appears to be doing about the same in regard to her wounds. She was actually started on Cipro after having biopsies NEMESIS, RAINWATER [name] ([id number]) [phone].pdf Page 3 of 14 apparently at her dermatology clinic she does not have the results back from the actual biopsy but the culture did return and apparently they placed her on the Cipro she started that just this morning. Other than that her legs appear to be doing okay at this time. 12/1; patient presents for follow-up. She has no issues or complaints today. She has started Lasix 40 mg daily to help with her bilateral leg swelling. 12/8; patient presents for follow-up. She  has no issues or complaints today. 12/15; patient presents for 1 week follow-up. She has no issues or complaints today. 12/22; patient presents for follow-up. She has no issues or complaints today. 1/5; patient presents for follow-up. She has no issues or complaints today. She has tolerated the compression wraps well. 1/12; patient presents for follow-up. She is tolerated the compression wrap well and has no issues or complaints today. She denies signs of infection. 1/19; patient presents for follow-up. She took the compression wraps off 1 to 2 days ago to take [name] shower and did not have compression wraps replaced. She reports increased blistering throughout her legs bilaterally. She currently denies signs of infection. 1/26; patient presents for follow-up. She has no issues or complaints today. She tolerated the compression wraps well. She has not heard from oncology to schedule an appointment. 2/2; patient presents for follow-up. She has no issues or complaints today. She continues to tolerate the compression wrap well. 2/16; patient presents for follow-up. She has no issues or complaints today. 2/23; patient presents for follow-up. She has no issues or complaints today. Tomorrow she has an appointment with oncology to look at the suspicious lesion on her [name] lower extremity. She currently denies signs of infection. 3/2; patient presents for follow-up. She has been using her juxta light compression to the [name] lower extremity however there has been increased swelling and opening of wounds throughout the legs with weeping. She currently denies signs of infection. She had no issues with the compression wrap to the left lower extremity. 3/16; patient presents for follow-up. She has no issues or complaints today. She states that she has been restarted on infusions for her squamous cell  carcinoma of her legs. She has also been increased on Lasix to 40 mg twice daily. She has noticed [name] decrease in  swelling to her legs. 3/23; patient presents for follow-up. She starts her second infusion next week for her squamous cell carcinoma lesions to her legs. She reports no issues with the compression wraps. 3/30; patient presents for follow-up. She has no issues or complaints today. 4/6; patient presents for follow-up. Her left lower extremity wounds have healed. She has her juxta light compression with her today. She has no issues or complaints today. 4/13; this is [name] patient we actually discharged to juxta lite stockings on the left leg the last time she was here. She came in on Monday for [place] to look at the leg and [name] nurse visit. She had massive swelling and numerous blisters and wound reopening. We put her back in 3 layer compression although I did not generate [name] note. Silver alginate on the wounded areas. We have also been doing the same on the [name]. In the meantime she is obtained her external compression pumps that were previously ordered and she started to use them. She has skin cancers that are obvious on the [name] leg x2 her appointment with Dr. Jarome Matin of dermatology is on May 5.. She is also receiving weekly chemotherapy for recurrent presumably squamous cell carcinomas apparently has had 2 treatments 4/20; patient presents for follow-up. She has her new juxta lite compressions today. She continues to have open wounds to the left lower extremity. She has follow-up with dermatology in 2 weeks. She continues to have IV infusions for her squamous cell carcinoma lesions on her legs. 4/28; the patient has bilateral lower extremity wounds predominantly on the [name] lateral upper leg, [name] anterior lower leg which in itself is probably [name] recurrent cancer as well as the left lateral lower leg. She has recurrent squamous cell carcinoma currently being treated with an IV infusion. She also has scattered nodules on her upper lower legs and thighs I am not certain of all of this is felt to be  malignant as well We have been using silver alginate on the open areas wrapping her legs. She also has her external compression pumps at home but I am not clear that she is going to use these 5/2; patient presents for follow-up. She has not been taking her diuretics as prescribed. She sees Dr. Dian Situ, dermatology at the end of the week. She tolerated the compression wraps well today. She has her juxta lite compressions. 5/8; patient presents for follow-up. She saw Dr. Dian Situ, dermatology and she has nothing to report on. She has been using her juxta lite compression to the [name] lower extremity. She has tolerated the compression wrap well in the left lower extremity. She has not been taking her diuretic. She is scheduled to continue her infusions for her squamous cell carcinoma on her legs. 5/18; patient presents for follow-up. We wrapped her in 3 layer compression at last clinic visit. She has no issues or complaints today. 5/22; patient presents for follow-up. She has been wrapped in 3 layer compression bilaterally to her lower extremities over the past week. She has been scratching at the top of the wrap and picking at her skin. This area has increased warmth and erythema. 5/30; patient presents for follow-up. She completed Keflex with resolution of her symptoms to the [name] lower extremity. She no longer has increased warmth or erythema. She continues her infusions for her squamous cell carcinoma lesions. We continue  to wrap her in 3 layer compression with silver alginate. She currently denies signs of infection. 6/8; the patient went to see Dr. Ronnald Ramp of dermatology 2 days ago unfortunately we [name] not have his notes. They removed her compression wraps of course but did not adequately replace them. She comes in today with 3 wounds on the [name] lateral upper calf 1 medially below the obvious squamous cell carcinoma there is [name] large necrotic wound on the left lateral tibial area on the left and several  blisters. The patient remains on Cemiplimad infusions for the squamous cell carcinoma bilaterally. She is not felt to be [name] good candidate for radiation. Some of the skin changes superiorly in the upper legs bilaterally according to Dr. Ronnald Ramp related to these infusions the same like fleshy nodules to me not blisters 6/13; patient presents for follow-up. We have been using silver alginate under 3 layer compression bilaterally. She has no issues or complaints today. 6/19; patient presents for follow-up. She will see her oncologist in 3 days and next week her dermatologist. She has no new complaints today. We are using silver alginate under 3 layer compression bilaterally. 6/29; patient presents for follow-up. She she saw Dr. Darrin Nipper last week and States she received injections to the lesions on her legs. We will ask for office visit records. She could not be wrapped with compression and has more drainage on exam today. [name], [name] ([id number]) [id number] Page 4 of 14 7/6; patient presents for follow-up. She continues to receive injections to the lesions in her legs by her dermatologist. She had this done earlier today. She has no issues or complaints today. 7/13; patient presents for follow-up. She is following with Dr.Kavuri once weekly for treatment of her SCCs/KAs with IL5-FU. We have been wrapping her with silver alginate under Kerlix/Coban. She has no issues or complaints today. 7/20; patient presents for follow-up. She has elected to stop doing weekly treatments for her squamous cell carcinoma lesions. She states she experiences too much pain from the injections. She is continuing her immunotherapy infusions. Next 1 is scheduled in August. T the legs we have been using silver alginate o under Kerlix/Coban. Her wounds actually look better today. 7/27; patient presents for follow-up. We have been using Medihoney and silver alginate under compression therapy. She has no  issues or complaints today. 8/1; patient presents for follow-up. We have been using Medihoney and silver alginate under compression therapy. She reports no issues. She is scheduled for her infusion for her SCC later this week. 8/22; patient presents for follow-up. We have been using Medihoney and silver alginate under compression therapy. She reports no issues today. She has moved back home from the skilled nursing facility. She has home health that will be coming out to [name] OT and PT She would like for them to also [name] wound . care. 9/18; patient presents for follow-up. We have been using Medihoney and silver alginate under compression therapy. Home health is no longer coming out for wound care. She comes in weekly for wrap changes in our office During nurse visits. 10/2; patient presents for follow-up. We have been using silver alginate under compression therapy. She has no issues or complaints today. She states she is doing her last chemoinfusion next week. 10/23; Patient presents for follow-up. We continue to use silver alginate under compression therapy. She has [name] few more wounds present today. She states she is taking her lasix as prescribed. She has completed her infusions for her SCC lesions. 11/16;  patient presents for follow-up. At last clinic visit she had more drainage and irritation and I recommended adding gentamicin and mupirocin with ketoconazole under the dressing and wrap. T oday her wounds have improved in appearance and drainage. She has no issues or complaints today. 11/30; patient presents for follow-up. I had recommended increased frequency in wrap changes [name] to drainage and odor Over the past several clinic visits. Patient now has home health. They are coming out 3 times [name] week. Her wounds have improved significantly. She has no issues or complaints today. 12/14; patient presents for follow-up. Home health has been coming out 3 times [name] week to help with dressing changes. We  have been using silver alginate under compression therapy to the lower extremities bilaterally. Electronic Signature(s) Signed: 01/02/2022 5:16:42 PM By: [name] Entered By: [name] on 01/02/2022 15:36:45 -------------------------------------------------------------------------------- Physical Exam Details Patient Name: Date of Service: [name], Motley. 01/02/2022 1:15 PM Medical Record Number: [id number] Patient Account Number: [id number] Date of Birth/Sex: Treating RN: 1925/03/25 (88 y.o. F) Primary Care Provider: [name] Other Clinician: Referring Provider: Treating Provider/Extender: [name] in Treatment: 11 Constitutional respirations regular, non-labored and within target range for patient.. Cardiovascular 2+ dorsalis pedis/posterior tibialis pulses. Psychiatric pleasant and cooperative. Notes Multiple scattered open wounds to her lower extremities bilaterally With granulation tissue and slough. No surrounding signs of infection. Good edema control. Lymphedema skin changes. No significant drainage or odor noted. She does have [name] suspicious lesion to the [name] lateral leg. Electronic Signature(s) Signed: 01/02/2022 5:16:42 PM By: [name] Entered By: [name] on 01/02/2022 15:39:53 [name], [name] ([id number]) [id number] Page 5 of 14 -------------------------------------------------------------------------------- Physician Orders Details Patient Name: Date of Service: [name], Bree [name]. 01/02/2022 1:15 PM Medical Record Number: [id number] Patient Account Number: [id number] Date of Birth/Sex: Treating RN: 06-Apr-1925 (88 y.o. [name] Primary Care Provider: [name] Other Clinician: Referring Provider: Treating Provider/Extender: [name] in Treatment: 56 Verbal / Phone Orders: No Diagnosis  Coding Follow-up Appointments ppointment in 2 weeks. - w/ Dr. Heber War Return [name] Bathing/ Shower/ Hygiene May shower with protection but [name] not get wound dressing(s) wet. May shower and wash wound with soap and water. - with dressing changes may wash wounds with soap and water. Edema Control - Lymphedema / SCD / Other Lymphedema Pumps. Use Lymphedema pumps on leg(s) 2-3 times [name] day for 45-60 minutes. If wearing any wraps or hose, [name] not remove them. Continue exercising as instructed. - use one hour each time twice [name] day. Elevate legs to the level of the heart or above for 30 minutes daily and/or when sitting, [name] frequency of: - elevate the legs throughout the day heart level if possible. Avoid standing for long periods of time. Exercise regularly Additional Orders / Instructions Follow [place] to [place] for skilled nursing wound care. May utilize formulary equivalent dressing for wound treatment orders unless otherwise specified. - Medihome to change 3 x [name] week, may [name] 2x [name] week if 3x is not possible. Wound Treatment Wound #20 - Lower Leg Wound Laterality: [name], Circumferential Cleanser: Soap and Water 3 x Per Week/30 Days Discharge Instructions: May shower and wash wound with dial antibacterial soap and water prior to dressing change. Cleanser: Wound Cleanser 3 x Per Week/30 Days Discharge Instructions: Cleanse the wound with wound cleanser prior to applying [name] clean dressing using gauze sponges, not tissue or cotton balls. Peri-Wound  Care: cetaphil lotion 3 x Per Week/30 Days Discharge Instructions: patient to provide to nurses to apply to both legs. Topical: Gentamicin 3 x Per Week/30 Days Discharge Instructions: As directed by physician Topical: Mupirocin Ointment 3 x Per Week/30 Days Discharge Instructions: Apply Mupirocin (Bactroban) as instructed Prim Dressing: KerraCel Ag Gelling Fiber Dressing, 4x5 in (silver alginate) 3 x Per Week/30  Days ary Discharge Instructions: Apply silver alginate to wound bed as instructed Secondary Dressing: ABD Pad, 8x10 3 x Per Week/30 Days Discharge Instructions: Apply over primary dressing as directed. Compression Wrap: ThreePress (3 layer compression wrap) 3 x Per Week/30 Days Discharge Instructions: Apply three layer compression as directed. ***PLEASE USE KERLIX INSTEAD OF COTTON.*** Wound #7RR - Lower Leg Wound Laterality: Left, Circumferential Cleanser: Soap and Water 3 x Per Week/30 Days Discharge Instructions: May shower and wash wound with dial antibacterial soap and water prior to dressing change. Cleanser: Wound Cleanser 3 x Per Week/30 Days Discharge Instructions: Cleanse the wound with wound cleanser prior to applying [name] clean dressing using gauze sponges, not tissue or cotton balls. Peri-Wound Care: cetaphil lotion 3 x Per Week/30 Days Discharge Instructions: patient to provide to nurses to apply to both legs. Topical: Gentamicin 3 x Per Week/30 Days [name], [name] ([id number]) [phone].pdf Page 6 of 14 Discharge Instructions: As directed by physician Topical: Mupirocin Ointment 3 x Per Week/30 Days Discharge Instructions: Apply Mupirocin (Bactroban) as instructed Prim Dressing: KerraCel Ag Gelling Fiber Dressing, 4x5 in (silver alginate) 3 x Per Week/30 Days ary Discharge Instructions: Apply silver alginate to wound bed as instructed Secondary Dressing: ABD Pad, 8x10 3 x Per Week/30 Days Discharge Instructions: Apply over primary dressing as directed. Compression Wrap: ThreePress (3 layer compression wrap) 3 x Per Week/30 Days Discharge Instructions: Apply three layer compression as directed. ***PLEASE USE KERLIX INSTEAD OF COTTON.*** Laboratory Bacteria identified in Tissue by Biopsy culture (MICRO) LOINC Code: [id number] Convenience Name: Biopsy specimen culture Electronic Signature(s) Signed: 01/02/2022 5:16:42 PM By: [name] Entered  By: [name] on 01/02/2022 15:40:03 -------------------------------------------------------------------------------- Problem List Details Patient Name: Date of Service: [name], [place]. 01/02/2022 1:15 PM Medical Record Number: [id number] Patient Account Number: [id number] Date of Birth/Sex: Treating RN: Oct 01, 1925 (88 y.o. 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 06/11/2020 No Yes exposed L97.822 Non-pressure chronic ulcer of other part of left lower leg with fat layer exposed5/23/2022 No Yes I89.0 Lymphedema, not elsewhere classified 01/31/2021 No Yes I87.313 Chronic venous hypertension (idiopathic) with ulcer of bilateral lower extremity 03/21/2021 No Yes Inactive Problems Resolved Problems ILEAH, FALKENSTEIN [name] ([id number]) [id number] Page 7 of 14 Electronic Signature(s) Signed: 01/02/2022 5:16:42 PM By: [name] Entered By: [name] on 01/02/2022 15:35:37 -------------------------------------------------------------------------------- Progress Note Details Patient Name: Date of Service: [name], [place]. 01/02/2022 1:15 PM Medical Record Number: [id number] Patient Account Number: [id number] Date of Birth/Sex: Treating RN: Dec 25, 1925 (88 y.o. F) Primary Care Provider: [name] Other Clinician: Referring Provider: Treating Provider/Extender: [name] in Treatment: 88 Subjective Chief Complaint Information obtained from Patient Bilateral lower extremity wounds that have been biopsied and positive for squamous cell carcinoma  Bilateral lower extremity wounds [name] to chronic venous insufficiency/lymphedema History of Present Illness (HPI)  The following HPI elements were documented for the patient's wound: Location: left leg Admission 5/23 Ms. 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 She states that the wound on the [name] has been present for [name] year. The 1 on the left has opened up 1 month ago. She is followed with oncology for this issue as she had biopsies that showed squamous cell carcinoma. She is also seeing radiation oncology for treatment options. She presents today because she would like for her wounds to be healed by [place]. She currently denies signs of infection. 6/1; patient presents for 1 week follow-up. She states she has tolerated the leg wraps well. She states these [name] not bother her and is happy to continue with them. She is scheduled to see her oncologist today to go over treatment options for the bilateral lower extremity squamous cell carcinoma. Radiation is currently not [name] recommended option. Patient states she overall feels well. 6/22; patient presents for 3-week follow-up. She has tolerated the wraps well until her last wrap where she states they were uncomfortable. She attributes this to the home health nurse. She denies signs of infection. She has started her first treatment of antibody infusions for her Bilateral lower extremity squamous cell carcinoma. She has no complaints today. 7/21; patient presents for 1 month follow-up. Unfortunately she has not had good experience with her wrap changes with home health. She would like to [name] her own dressing changes. She continues to [name] her antibody infusions. She denies signs of infection. 7/28; patient presents for 1 week  follow-up. At last clinic visit she was switched to daily dressing changes [name] to issues with the wrap and home health placing them. Unfortunately she has developed weeping to her legs bilaterally. She would like to be placed in wraps today. She would also like to follow with [place] weekly for wrap changes instead of having home health change them. She denies signs of infection. 8/4; patient presents for 1 week follow-up. She has tolerated the Kerlix/Coban wraps well. She no longer has weeping to her legs. She took the wrap off 1 day before coming in to be able to take [name] shower. She has no issues or complaints today. She denies signs of infection. 8/18; patient presents for follow-up. Patient has tolerated the wraps well. She brought her Velcro compression wraps today. She has no issues or complaints today. She had her chemotherapy infusion yesterday without issues. She denies signs of infection. 8/25; patient presents for follow-up. She used her juxta lite compressions for the past week. It is unclear if she is able to put these on correctly since she states she has [name] hard time getting them to look [name]. She reports 2 open wounds. She currently denies signs of infection. 9/1; patient presents for 1 week follow-up. She has 1 open wound. She tolerated the compression wraps well. She currently denies signs of infection. 9/8; patient presents for 1 week follow-up. She has 2 open wounds 1 on each leg. She has tolerated the compression wrap well. She currently denies signs of infection. 9/15; patient presents for 1 week follow-up. She now has 3 wounds. 2 on the left and 1 on the [name]. She continues to tolerate the compression wrap well. She currently denies signs of infection. She has obtained furosemide by her primary  care physician and would like to discuss when to take this. 9/22; patient presents for 1 week follow-up. She has scattered wounds on her lower extremities bilaterally. She did take furosemide  twice in the past week. She does not recall having to urinate more frequently. She tolerated the 3 layer compression well. She denies signs of infection. 10/13; patient has not been here recently because of [place]. Apparently the facility was only putting gauze on her legs. This is [name] patient I [name] not normally see. She has [name] history of squamous cell carcinoma bilaterally on her anterior lower legs followed for [name] period of time by Dr. Ronnald Ramp at [facility] dermatology. She is quite convincing that she did not have radiation to her lower legs. It is likely she also has significant chronic venous insufficiency stasis dermatitis. We have been using silver alginate under kerlix Coban. She has obvious open areas medially on the [name] and areas on the left. She also has areas of extensive dry flaking adherent areas on the [name] and to [name] lesser extent on the left anterior. Nodular areas on the left lateral lower leg left posterior calf and [name] mid calf medially. 10/21; patient presents for follow-up. She has no issues or complaints today. She has tolerated the 3 layer compression well bilaterally. She denies signs of infection. 10/27; patient presents for follow-up. She continues to tolerate the 3 layer compression wrap well. She denies signs of infection. [name], [name] ([id number]) [id number] Page 8 of 14 11/30; patient presents for follow-up. She has no issues or complaints today. 11/10; patient arrived in clinic today for nurse visit accompanied by her daughter from [place]. The daughter had multiple questions so we turned this into doctors visit. Apparently after the last time I saw this woman in October she went to see Dr. Ronnald Ramp but he did not take the wraps off. She previously was treated with Cemiplimab for her squamous cell carcinoma. She did not receive radiation to her legs. I had wanted Dr. Ronnald Ramp to look at this because of the exceptionally damaged skin on her lower legs  bilaterally. She has odd looking wounds on the left medial lower leg also extending posteriorly which we have not made [name] lot of progress on. But she also has [name] raised hyperkeratotic nodules on the [name] anterior tibial area plaques on the left medial lower thigh. These are not areas that are under compression. We have been using silver alginate on any open areas Liberal TCA under 3 layer compression. 11/17; patient presents for follow-up. She had her wraps taken off yesterday for her dermatology appointment. She reports excessive weeping to her legs bilaterally after the wraps were taken off. She currently denies signs of infection. 12/12/2020 upon evaluation today patient appears to be doing about the same in regard to her wounds. She was actually started on Cipro after having biopsies apparently at her dermatology clinic she does not have the results back from the actual biopsy but the culture did return and apparently they placed her on the Cipro she started that just this morning. Other than that her legs appear to be doing okay at this time. 12/1; patient presents for follow-up. She has no issues or complaints today. She has started Lasix 40 mg daily to help with her bilateral leg swelling. 12/8; patient presents for follow-up. She has no issues or complaints today. 12/15; patient presents for 1 week follow-up. She has no issues or complaints today. 12/22; patient presents for follow-up. She has no issues or  complaints today. 1/5; patient presents for follow-up. She has no issues or complaints today. She has tolerated the compression wraps well. 1/12; patient presents for follow-up. She is tolerated the compression wrap well and has no issues or complaints today. She denies signs of infection. 1/19; patient presents for follow-up. She took the compression wraps off 1 to 2 days ago to take [name] shower and did not have compression wraps replaced. She reports increased blistering throughout her legs  bilaterally. She currently denies signs of infection. 1/26; patient presents for follow-up. She has no issues or complaints today. She tolerated the compression wraps well. She has not heard from oncology to schedule an appointment. 2/2; patient presents for follow-up. She has no issues or complaints today. She continues to tolerate the compression wrap well. 2/16; patient presents for follow-up. She has no issues or complaints today. 2/23; patient presents for follow-up. She has no issues or complaints today. Tomorrow she has an appointment with oncology to look at the suspicious lesion on her [name] lower extremity. She currently denies signs of infection. 3/2; patient presents for follow-up. She has been using her juxta light compression to the [name] lower extremity however there has been increased swelling and opening of wounds throughout the legs with weeping. She currently denies signs of infection. She had no issues with the compression wrap to the left lower extremity. 3/16; patient presents for follow-up. She has no issues or complaints today. She states that she has been restarted on infusions for her squamous cell carcinoma of her legs. She has also been increased on Lasix to 40 mg twice daily. She has noticed [name] decrease in swelling to her legs. 3/23; patient presents for follow-up. She starts her second infusion next week for her squamous cell carcinoma lesions to her legs. She reports no issues with the compression wraps. 3/30; patient presents for follow-up. She has no issues or complaints today. 4/6; patient presents for follow-up. Her left lower extremity wounds have healed. She has her juxta light compression with her today. She has no issues or complaints today. 4/13; this is [name] patient we actually discharged to juxta lite stockings on the left leg the last time she was here. She came in on Monday for [place] to look at the leg and [name] nurse visit. She had massive swelling and numerous  blisters and wound reopening. We put her back in 3 layer compression although I did not generate [name] note. Silver alginate on the wounded areas. We have also been doing the same on the [name]. In the meantime she is obtained her external compression pumps that were previously ordered and she started to use them. She has skin cancers that are obvious on the [name] leg x2 her appointment with Dr. Jarome Matin of dermatology is on May 5.. She is also receiving weekly chemotherapy for recurrent presumably squamous cell carcinomas apparently has had 2 treatments 4/20; patient presents for follow-up. She has her new juxta lite compressions today. She continues to have open wounds to the left lower extremity. She has follow-up with dermatology in 2 weeks. She continues to have IV infusions for her squamous cell carcinoma lesions on her legs. 4/28; the patient has bilateral lower extremity wounds predominantly on the [name] lateral upper leg, [name] anterior lower leg which in itself is probably [name] recurrent cancer as well as the left lateral lower leg. She has recurrent squamous cell carcinoma currently being treated with an IV infusion. She also has scattered nodules on her upper lower  legs and thighs I am not certain of all of this is felt to be malignant as well We have been using silver alginate on the open areas wrapping her legs. She also has her external compression pumps at home but I am not clear that she is going to use these 5/2; patient presents for follow-up. She has not been taking her diuretics as prescribed. She sees Dr. Dian Situ, dermatology at the end of the week. She tolerated the compression wraps well today. She has her juxta lite compressions. 5/8; patient presents for follow-up. She saw Dr. Dian Situ, dermatology and she has nothing to report on. She has been using her juxta lite compression to the [name] lower extremity. She has tolerated the compression wrap well in the left lower extremity. She has  not been taking her diuretic. She is scheduled to continue her infusions for her squamous cell carcinoma on her legs. 5/18; patient presents for follow-up. We wrapped her in 3 layer compression at last clinic visit. She has no issues or complaints today. 5/22; patient presents for follow-up. She has been wrapped in 3 layer compression bilaterally to her lower extremities over the past week. She has been scratching at the top of the wrap and picking at her skin. This area has increased warmth and erythema. 5/30; patient presents for follow-up. She completed Keflex with resolution of her symptoms to the [name] lower extremity. She no longer has increased warmth or erythema. She continues her infusions for her squamous cell carcinoma lesions. We continue to wrap her in 3 layer compression with silver alginate. She currently denies signs of infection. 6/8; the patient went to see Dr. Ronnald Ramp of dermatology 2 days ago unfortunately we [name] not have his notes. They removed her compression wraps of course but [name], [name] ([id number]) [phone].pdf Page 9 of 14 did not adequately replace them. She comes in today with 3 wounds on the [name] lateral upper calf 1 medially below the obvious squamous cell carcinoma there is [name] large necrotic wound on the left lateral tibial area on the left and several blisters. The patient remains on Cemiplimad infusions for the squamous cell carcinoma bilaterally. She is not felt to be [name] good candidate for radiation. Some of the skin changes superiorly in the upper legs bilaterally according to Dr. Ronnald Ramp related to these infusions the same like fleshy nodules to me not blisters 6/13; patient presents for follow-up. We have been using silver alginate under 3 layer compression bilaterally. She has no issues or complaints today. 6/19; patient presents for follow-up. She will see her oncologist in 3 days and next week her dermatologist. She has no new complaints  today. We are using silver alginate under 3 layer compression bilaterally. 6/29; patient presents for follow-up. She she saw Dr. Darrin Nipper last week and States she received injections to the lesions on her legs. We will ask for office visit records. She could not be wrapped with compression and has more drainage on exam today. 7/6; patient presents for follow-up. She continues to receive injections to the lesions in her legs by her dermatologist. She had this done earlier today. She has no issues or complaints today. 7/13; patient presents for follow-up. She is following with Dr.Kavuri once weekly for treatment of her SCCs/KAs with IL[id number]. We have been wrapping her with silver alginate under Kerlix/Coban. She has no issues or complaints today. 7/20; patient presents for follow-up. She has elected to stop doing weekly treatments for her squamous cell carcinoma lesions. She states she  experiences too much pain from the injections. She is continuing her immunotherapy infusions. Next 1 is scheduled in August. T the legs we have been using silver alginate o under Kerlix/Coban. Her wounds actually look better today. 7/27; patient presents for follow-up. We have been using Medihoney and silver alginate under compression therapy. She has no issues or complaints today. 8/1; patient presents for follow-up. We have been using Medihoney and silver alginate under compression therapy. She reports no issues. She is scheduled for her infusion for her SCC later this week. 8/22; patient presents for follow-up. We have been using Medihoney and silver alginate under compression therapy. She reports no issues today. She has moved back home from the skilled nursing facility. She has home health that will be coming out to [name] OT and PT She would like for them to also [name] wound . care. 9/18; patient presents for follow-up. We have been using Medihoney and silver alginate under compression therapy. Home health is no longer  coming out for wound care. She comes in weekly for wrap changes in our office During nurse visits. 10/2; patient presents for follow-up. We have been using silver alginate under compression therapy. She has no issues or complaints today. She states she is doing her last chemoinfusion next week. 10/23; Patient presents for follow-up. We continue to use silver alginate under compression therapy. She has [name] few more wounds present today. She states she is taking her lasix as prescribed. She has completed her infusions for her SCC lesions. 11/16; patient presents for follow-up. At last clinic visit she had more drainage and irritation and I recommended adding gentamicin and mupirocin with ketoconazole under the dressing and wrap. T oday her wounds have improved in appearance and drainage. She has no issues or complaints today. 11/30; patient presents for follow-up. I had recommended increased frequency in wrap changes [name] to drainage and odor Over the past several clinic visits. Patient now has home health. They are coming out 3 times [name] week. Her wounds have improved significantly. She has no issues or complaints today. 12/14; patient presents for follow-up. Home health has been coming out 3 times [name] week to help with dressing changes. We have been using silver alginate under compression therapy to the lower extremities bilaterally. Patient History Information obtained from Patient. Family History Unknown History. Social History Never smoker, Marital Status - Single, Alcohol Use - Never, Drug Use - No History, Caffeine Use - Never. Medical History Eyes Denies history of Cataracts, Optic Neuritis Ear/Nose/Mouth/Throat Denies history of Chronic sinus problems/congestion, Middle ear problems Hematologic/Lymphatic Patient has history of Anemia Denies history of Hemophilia, Human Immunodeficiency Virus, Lymphedema, Sickle Cell Disease Respiratory Denies history of Aspiration, Asthma, Chronic  Obstructive Pulmonary Disease (COPD), Pneumothorax, Sleep Apnea, Tuberculosis Cardiovascular Patient has history of Arrhythmia - Atrial Flutter, [name] fibb, Congestive Heart Failure, Hypertension Denies history of Angina, Coronary Artery Disease, Deep Vein Thrombosis, Hypotension, Myocardial Infarction, Peripheral Arterial Disease, Peripheral Venous Disease, Phlebitis, Vasculitis Gastrointestinal Patient has history of Colitis Denies history of Cirrhosis , Crohnoos, Hepatitis [name], Hepatitis B, Hepatitis C Endocrine Denies history of Type I Diabetes, Type II Diabetes Genitourinary Denies history of End Stage Renal Disease Immunological Denies history of Lupus Erythematosus, Raynaudoos, Scleroderma Integumentary (Skin) Denies history of History of Burn Musculoskeletal Patient has history of Osteoarthritis Denies history of Gout, Rheumatoid Arthritis, Osteomyelitis Neurologic Denies history of Dementia, Neuropathy, Quadriplegia, Paraplegia, Seizure Disorder Oncologic [name], [name] ([id number]) [id number] Page 10 of 14 Denies history of Received Chemotherapy, Received Radiation Hospitalization/Surgery  History - removal of rod left leg. Medical [name] Surgical History Notes nd Cardiovascular hyperlipidemia Endocrine hypothyroidism Neurologic lumbar spindylolysis Oncologic BLE squamous ceel carcionoma Objective Constitutional respirations regular, non-labored and within target range for patient.. Vitals Time Taken: 2:05 PM, Height: 65 in, Weight: 163 lbs, BMI: 27.1, Temperature: 97.6 F, Pulse: 65 bpm, Respiratory Rate: 18 breaths/min, Blood Pressure: 109/63 mmHg. Cardiovascular 2+ dorsalis pedis/posterior tibialis pulses. Psychiatric pleasant and cooperative. General Notes: Multiple scattered open wounds to her lower extremities bilaterally With granulation tissue and slough. No surrounding signs of infection. Good edema control. Lymphedema skin changes.  No significant drainage or odor noted. She does have [name] suspicious lesion to the [name] lateral leg. Integumentary (Hair, Skin) Wound #20 status is Open. Original cause of wound was Gradually Appeared. The date acquired was: 06/25/2021. The wound has been in treatment 27 weeks. The wound is located on the [name],Circumferential Lower Leg. The wound measures 17.5cm length x 28cm width x 0.1cm depth; 384.845cm^2 area and 38.485cm^3 volume. There is Fat Layer (Subcutaneous Tissue) exposed. There is no tunneling or undermining noted. There is [name] large amount of serosanguineous drainage noted. Foul odor after cleansing was noted. The wound margin is distinct with the outline attached to the wound base. There is large (67-100%) red, pink granulation within the wound bed. There is [name] small (1-33%) amount of necrotic tissue within the wound bed including Adherent Slough. The periwound skin appearance exhibited: Maceration, Hemosiderin Staining. The periwound skin appearance did not exhibit: Callus, Crepitus, Excoriation, Induration, Rash, Scarring, Dry/Scaly, Atrophie Blanche, Cyanosis, Ecchymosis, Mottled, Pallor, Rubor, Erythema. Periwound temperature was noted as No Abnormality. The periwound has tenderness on palpation. Wound #7RR status is Open. Original cause of wound was Blister. The date acquired was: 04/20/2020. The wound has been in treatment 81 weeks. The wound is located on the Left,Circumferential Lower Leg. The wound measures 20cm length x 18cm width x 0.1cm depth; 282.743cm^2 area and 28.274cm^3 volume. There is Fat Layer (Subcutaneous Tissue) exposed. There is no tunneling or undermining noted. There is [name] large amount of serosanguineous drainage noted. Foul odor after cleansing was noted. The wound margin is distinct with the outline attached to the wound base. There is small (1-33%) red, pink granulation within the wound bed. There is [name] large (67-100%) amount of necrotic tissue within the wound bed  including Adherent Slough. The periwound skin appearance exhibited: Dry/Scaly, Maceration, Hemosiderin Staining. The periwound skin appearance did not exhibit: Callus, Crepitus, Excoriation, Induration, Rash, Scarring, Atrophie Blanche, Cyanosis, Ecchymosis, Mottled, Pallor, Rubor, Erythema. Periwound temperature was noted as No Abnormality. 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 I recommended continuing the course with silver alginate under compression therapy. She has [name] suspicious lesion to the [name] anterior lateral leg. I did [name] punch biopsy on this. She has [name] history of squamous cell carcinoma to her lower extremities. She has recently stopped immunotherapy for this issue. Follow-up in 2 weeks. Procedures Wound #20 [name], [name] ([id number]) [id number] Page 11 of 14 Pre-procedure diagnosis of Wound #20 is [name] Lymphedema located on the [name], Circumferential Lower Leg . There was [name] biopsy performed by [name] Shan, [name]. There was [name] biopsy performed on Wound Bed.  The skin was cleansed and prepped with anti-septic followed by pain control using Lidocaine Injectable: 1%. Utilizing [name] 5 mm tissue punch, tissue was removed at its base with the following instrument(s): Forceps and Scissors and sent to pathology. [name] Moderate amount of bleeding was controlled with Surgicel. [name] time out was conducted at 15:00, prior to the start of the procedure. The procedure was tolerated well with [name] pain level of 0 throughout and [name] pain level of 0 following the procedure. Post procedure Diagnosis  Wound #20: Same as Pre-Procedure Pre-procedure diagnosis of Wound #20 is [name] Lymphedema located on the [name],Circumferential Lower Leg . There was [name] Three Layer Compression Therapy Procedure by Rhae Hammock, RN. Post procedure Diagnosis Wound #20: Same as Pre-Procedure Wound #7RR Pre-procedure diagnosis of Wound #7RR is [name] Malignant Wound located on the Left,Circumferential Lower Leg . There was [name] Three Layer Compression Therapy Procedure by Rhae Hammock, RN. Post procedure Diagnosis Wound #7RR: Same as Pre-Procedure Plan Follow-up Appointments: Return Appointment in 2 weeks. - w/ Dr. Heber Stuckey Bathing/ Shower/ Hygiene: May shower with protection but [name] not get wound dressing(s) wet. May shower and wash wound with soap and water. - with dressing changes may wash wounds with soap and water. Edema Control - Lymphedema / SCD / Other: Lymphedema Pumps. Use Lymphedema pumps on leg(s) 2-3 times [name] day for 45-60 minutes. If wearing any wraps or hose, [name] not remove them. Continue exercising as instructed. - use one hour each time twice [name] day. Elevate legs to the level of the heart or above for 30 minutes daily and/or when sitting, [name] frequency of: - elevate the legs throughout the day heart level if possible. Avoid standing for long periods of time. Exercise regularly Additional Orders / Instructions: Follow Nutritious Diet Home Health: Admit to Home Health for skilled nursing wound care. May utilize formulary equivalent dressing for wound treatment orders unless otherwise specified. - Medihome to change 3 x [name] week, may [name] 2x [name] week if 3x is not possible. Laboratory ordered were: Biopsy specimen culture WOUND #20: - Lower Leg Wound Laterality: [name], Circumferential Cleanser: Soap and Water 3 x Per Week/30 Days Discharge Instructions: May shower and wash wound with dial antibacterial soap and water prior to dressing change. Cleanser: Wound Cleanser 3 x Per Week/30 Days Discharge  Instructions: Cleanse the wound with wound cleanser prior to applying [name] clean dressing using gauze sponges, not tissue or cotton balls. Peri-Wound Care: cetaphil lotion 3 x Per Week/30 Days Discharge Instructions: patient to provide to nurses to apply to both legs. Topical: Gentamicin 3 x Per Week/30 Days Discharge Instructions: As directed by physician Topical: Mupirocin Ointment 3 x Per Week/30 Days Discharge Instructions: Apply Mupirocin (Bactroban) as instructed Prim Dressing: KerraCel Ag Gelling Fiber Dressing, 4x5 in (silver alginate) 3 x Per Week/30 Days ary Discharge Instructions: Apply silver alginate to wound bed as instructed Secondary Dressing: ABD Pad, 8x10 3 x Per Week/30 Days Discharge Instructions: Apply over primary dressing as directed. Com pression Wrap: ThreePress (3 layer compression wrap) 3 x Per Week/30 Days Discharge Instructions: Apply three layer compression as directed. ***PLEASE USE KERLIX INSTEAD OF COTTON.*** WOUND #7RR: - Lower Leg Wound Laterality: Left, Circumferential Cleanser: Soap and Water 3 x Per Week/30 Days Discharge Instructions: May shower and wash wound with dial antibacterial soap and water prior to dressing change. Cleanser: Wound Cleanser 3 x Per Week/30 Days Discharge Instructions: Cleanse the wound with wound cleanser prior to applying [name] clean dressing using gauze sponges, not tissue or  cotton balls. Peri-Wound Care: cetaphil lotion 3 x Per Week/30 Days Discharge Instructions: patient to provide to nurses to apply to both legs. Topical: Gentamicin 3 x Per Week/30 Days Discharge Instructions: As directed by physician Topical: Mupirocin Ointment 3 x Per Week/30 Days Discharge Instructions: Apply Mupirocin (Bactroban) as instructed Prim Dressing: KerraCel Ag Gelling Fiber Dressing, 4x5 in (silver alginate) 3 x Per Week/30 Days ary Discharge Instructions: Apply silver alginate to wound bed as instructed Secondary Dressing: ABD Pad, 8x10 3 x Per  Week/30 Days Discharge Instructions: Apply over primary dressing as directed. Com pression Wrap: ThreePress (3 layer compression wrap) 3 x Per Week/30 Days Discharge Instructions: Apply three layer compression as directed. ***PLEASE USE KERLIX INSTEAD OF COTTON.*** 1. Punch biopsy 2. Silver alginate under 3 layer compression to the lower extremities bilaterally 3. Follow-up in 2 weeks Electronic Signature(s) Signed: 01/02/2022 5:16:42 PM By: Norva Karvonen, Mariaguadalupe [name] ([id number]) [id number] Page 12 of 14 Entered By: [name] on 01/02/2022 15:52:14 -------------------------------------------------------------------------------- HxROS Details Patient Name: Date of Service: [name], [name]. 01/02/2022 1:15 PM Medical Record Number: [id number] Patient Account Number: [id number] Date of Birth/Sex: Treating RN: [birth year] (88 y.o. F) Primary Care Provider: [name] Other Clinician: Referring Provider: Treating Provider/Extender: [name] in Treatment: 16 Information Obtained From Patient Eyes Medical History: Negative for: Cataracts; Optic Neuritis Ear/Nose/Mouth/Throat Medical History: Negative for: Chronic sinus problems/congestion; Middle ear problems Hematologic/Lymphatic Medical History: Positive for: Anemia Negative for: Hemophilia; Human Immunodeficiency Virus; Lymphedema; Sickle Cell Disease Respiratory Medical History: Negative for: Aspiration; Asthma; Chronic Obstructive Pulmonary Disease (COPD); Pneumothorax; Sleep Apnea; Tuberculosis Cardiovascular Medical History: Positive for: Arrhythmia - Atrial Flutter, [name] fibb; Congestive Heart Failure; Hypertension Negative for: Angina; Coronary Artery Disease; Deep Vein Thrombosis; Hypotension; Myocardial Infarction; Peripheral Arterial Disease; Peripheral Venous Disease; Phlebitis; Vasculitis Past Medical History  Notes: hyperlipidemia Gastrointestinal Medical History: Positive for: Colitis Negative for: Cirrhosis ; Crohns; Hepatitis [name]; Hepatitis B; Hepatitis C Endocrine Medical History: Negative for: Type I Diabetes; Type II Diabetes Past Medical History Notes: hypothyroidism Genitourinary Medical History: Negative for: End Stage Renal Disease Immunological Medical History: Negative for: Lupus Erythematosus; Raynauds; Scleroderma Integumentary (Skin) Medical History: Negative for: History of Burn [name], RATHOD [name] ([id number]) [id number] Page 13 of 14 Musculoskeletal Medical History: Positive for: Osteoarthritis Negative for: Gout; Rheumatoid Arthritis; Osteomyelitis Neurologic Medical History: Negative for: Dementia; Neuropathy; Quadriplegia; Paraplegia; Seizure Disorder Past Medical History Notes: lumbar spindylolysis Oncologic Medical History: Negative for: Received Chemotherapy; Received Radiation Past Medical History Notes: BLE squamous ceel carcionoma Immunizations Pneumococcal Vaccine: Received Pneumococcal Vaccination: Yes Received Pneumococcal Vaccination On or After 60th Birthday: No Implantable Devices None Hospitalization / Surgery History Type of Hospitalization/Surgery removal of rod left leg Family and Social History Unknown History: Yes; Never smoker; Marital Status - Single; Alcohol Use: Never; Drug Use: No History; Caffeine Use: Never; Financial Concerns: No; Food, Clothing or Shelter Needs: No; Support System Lacking: No; Transportation Concerns: No Electronic Signature(s) Signed: 01/02/2022 5:16:42 PM By: [name] Entered By: [name] on 01/02/2022 15:36:55 -------------------------------------------------------------------------------- SuperBill Details Patient Name: Date of Service: [name], Maud. 01/02/2022 Medical Record Number: [id number] Patient Account Number: [id number] Date of Birth/Sex: Treating  RN: 03-Oct-1925 (88 y.o. F) Primary Care Provider: [name] Other Clinician: Referring Provider: Treating Provider/Extender: [name] in Treatment: 51 Diagnosis Coding ICD-10 Codes Code Description C44.92 Squamous cell carcinoma of skin, unspecified L97.812 Non-pressure chronic ulcer of other part of [name] lower leg with fat layer  exposed L97.822 Non-pressure chronic ulcer of other part of left lower leg with fat layer exposed I89.0 Lymphedema, not elsewhere classified I87.313 Chronic venous hypertension (idiopathic) with ulcer of bilateral lower extremity Facility Procedures : FEIGA, NADEL: Code [id number] [name] [phone] Description: 81 BILATERAL: Application of multi-layer venous compression system; leg (below knee), including ankle and t. 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

## 2022-01-02 NOTE — Progress Notes (Addendum)
SHENICKA, CURBY A ([id number]) [id number] Page 1 of 9 Visit Report for 01/02/2022 Arrival Information Details Patient Name: Date of Service: [name], [place]. 01/02/2022 1:15 PM Medical Record Number: [id number] Patient Account Number: [id number] Date of Birth/Sex: Treating RN: Sep 25, 1925 (88 y.o. Female) Primary Care Shyne Resch: [name] Other Clinician: Referring Tynetta Bachmann: Treating Jamille Fisher/Extender: [name] in Treatment: 30 Visit Information History Since Last Visit Added or deleted any medications: No Patient Arrived: Ambulatory Any new allergies or adverse reactions: No Arrival Time: 14:05 Had a fall or experienced change in No Accompanied By: daughter activities of daily living that may affect Transfer Assistance: None risk of falls: Patient Identification Verified: Yes Signs or symptoms of abuse/neglect since last visito No Secondary Verification Process Completed: Yes Hospitalized since last visit: No Patient Requires Transmission-Based No Implantable device outside of the clinic excluding No Precautions: cellular tissue based products placed in the center Patient Has Alerts: Yes since last visit: Patient Alerts: R ABI = Non compressible Has Compression in Place as Prescribed: Yes L ABI = Non compressible Pain Present Now: No Electronic Signature(s) Signed: 01/02/2022 4:17:22 PM By: Erenest Blank Entered By: Erenest Blank on 01/02/2022 14:05:41 -------------------------------------------------------------------------------- Compression Therapy Details Patient Name: Date of Service: [name], Jailynn A. 01/02/2022 1:15 PM Medical Record Number: [id number] Patient Account Number: [id number] Date of Birth/Sex: Treating RN: Apr 27, 1925 (88 y.o. Female) Rhae Hammock Primary Care Adriel Desrosier: [name] Other Clinician: Referring Buryl Bamber: Treating Cleburne Savini/Extender: [name] in Treatment: 68 Compression Therapy Performed for Wound Assessment: Wound #20 Right,Circumferential Lower Leg Performed By: Clinician Rhae Hammock, RN Compression Type: Three Layer Post Procedure Diagnosis Same as Pre-procedure Electronic Signature(s) Signed: 01/02/2022 5:16:09 PM By: Rhae Hammock RN Entered By: Rhae Hammock on 01/02/2022 14:47:22 Langer, Malaak A ([id number] Page 2 of 9 -------------------------------------------------------------------------------- Compression Therapy Details Patient Name: Date of Service: [name], Lyniah A. 01/02/2022 1:15 PM Medical Record Number: [id number] Patient Account Number: [id number] Date of Birth/Sex: Treating RN: 05/05/1925 (88 y.o. Female) Rhae Hammock Primary Care Shanise Balch: [name] Other Clinician: Referring Aramis Weil: Treating Lilyona Richner/Extender: [name] in Treatment: 20 Compression Therapy Performed for Wound Assessment: Wound #7RR Left,Circumferential Lower Leg Performed By: Clinician Rhae Hammock, RN Compression Type: Three Layer Post Procedure Diagnosis Same as Pre-procedure Electronic Signature(s) Signed: 01/02/2022 5:16:09 PM By: Rhae Hammock RN Entered By: Rhae Hammock on 01/02/2022 14:47:22 -------------------------------------------------------------------------------- Encounter Discharge Information Details Patient Name: Date of Service: [name], [place]. 01/02/2022 1:15 PM Medical Record Number: [id number] Patient Account Number: [id number] Date of Birth/Sex: Treating RN: [birth year] (88 y.o. Female) Primary Care Aidric Endicott: [name] Other Clinician: Referring Ada Holness: Treating Mackay Hanauer/Extender: [name] in Treatment: 41 Encounter Discharge Information Items Post Procedure Vitals Discharge Condition:  Stable Temperature (F): 98.7 Ambulatory Status: Walker Pulse (bpm): 74 Discharge Destination: Home Respiratory Rate (breaths/min): 17 Transportation: Private Auto Blood Pressure (mmHg): 138/84 Accompanied By: daughter Schedule Follow-up Appointment: Yes Clinical Summary of Care: Patient Declined Electronic Signature(s) Signed: 01/02/2022 4:17:22 PM By: Erenest Blank Entered By: Erenest Blank on 01/02/2022 15:06:24 -------------------------------------------------------------------------------- Lower Extremity Assessment Details Patient Name: Date of Service: [name], [place]. 01/02/2022 1:15 PM Medical Record Number: [id number] Patient Account Number: [id number] Date of Birth/Sex: Treating RN: [birth year] (88 y.o. Female) Primary Care Montray Kliebert: [name] Other Clinician: Referring Darby Shadwick: Treating Noam Karaffa/Extender: [name] in Treatment: 35 Edema Assessment Assessed: [Left: No] [Right: No] Edema: [Left: No] [Right: No]  Calf CATRICIA, WILKINSON A ([id number]) [id number] Page 3 of 9 Left: Right: Point of Measurement: 30 cm From Medial Instep 32 cm 31 cm Ankle Left: Right: Point of Measurement: 9 cm From Medial Instep 22 cm 22.7 cm Electronic Signature(s) Signed: 01/02/2022 4:17:22 PM By: Erenest Blank Entered By: Erenest Blank on 01/02/2022 14:28:58 -------------------------------------------------------------------------------- Multi Wound Chart Details Patient Name: Date of Service: [name], Kenidy A. 01/02/2022 1:15 PM Medical Record Number: [id number] Patient Account Number: [id number] Date of Birth/Sex: Treating RN: [birth year] (88 y.o. Female) Primary Care Armanie Martine: [name] Other Clinician: Referring Jazzlynn Rawe: Treating Tangala Wiegert/Extender: [name] in Treatment: 73 Vital Signs Height(in): 65 Pulse(bpm): 65 Weight(lbs): 163 Blood Pressure(mmHg):  109/63 Body Mass Index(BMI): 27.1 Temperature(F): 97.6 Respiratory Rate(breaths/min): 18 [20:Photos:] [N/A:N/A] Right, Circumferential Lower Leg Left, Circumferential Lower Leg N/A Wound Location: Gradually Appeared Blister N/A Wounding Event: Lymphedema Malignant Wound N/A Primary Etiology: Anemia, Arrhythmia, Congestive Heart Anemia, Arrhythmia, Congestive Heart N/A Comorbid History: Failure, Hypertension, Colitis, Failure, Hypertension, Colitis, Osteoarthritis Osteoarthritis 06/25/2021 04/20/2020 N/A Date Acquired: 27 81 N/A Weeks of Treatment: Open Open N/A Wound Status: No Yes N/A Wound Recurrence: AMANI, ALPER A ([id number]) [id number] Page 4 of 9 Yes Yes N/A Clustered Wound: 4 4 N/A Clustered Quantity: 17.5x28x0.1 20x18x0.1 N/A Measurements L x W x D (cm) 384.845 282.743 N/A A (cm) : rea 38.485 28.274 N/A Volume (cm) : 3.20% -9106.90% N/A % Reduction in Area: 3.20% -9109.80% N/A % Reduction in Volume: Full Thickness Without Exposed Full Thickness Without Exposed N/A Classification: Support Structures Support Structures Large Large N/A Exudate Amount: Serosanguineous Serosanguineous N/A Exudate Type: red, brown red, brown N/A Exudate Color: Yes Yes N/A Foul Odor A Cleansing: fter No No N/A Odor Anticipated Due to Product Use: Distinct, outline attached Distinct, outline attached N/A Wound Margin: Large (67-100%) Small (1-33%) N/A Granulation A mount: Red, Pink Red, Pink N/A Granulation Quality: Small (1-33%) Large (67-100%) N/A Necrotic Amount: Fat Layer (Subcutaneous Tissue): Yes Fat Layer (Subcutaneous Tissue): Yes N/A Exposed Structures: Fascia: No Fascia: No Tendon: No Tendon: No Muscle: No Muscle: No Joint: No Joint: No Bone: No Bone: No None None N/A Epithelialization: Excoriation: No Excoriation: No N/A Periwound Skin Texture: Induration: No Induration: No Callus: No Callus: No Crepitus:  No Crepitus: No Rash: No Rash: No Scarring: No Scarring: No Maceration: Yes Maceration: Yes N/A Periwound Skin Moisture: Dry/Scaly: No Dry/Scaly: Yes Hemosiderin Staining: Yes Hemosiderin Staining: Yes N/A Periwound Skin Color: Atrophie Blanche: No Atrophie Blanche: No Cyanosis: No Cyanosis: No Ecchymosis: No Ecchymosis: No Erythema: No Erythema: No Mottled: No Mottled: No Pallor: No Pallor: No Rubor: No Rubor: No No Abnormality No Abnormality N/A Temperature: Yes N/A N/A Tenderness on Palpation: Biopsy Compression Therapy N/A Procedures Performed: Compression Therapy Treatment Notes Wound #20 (Lower Leg) Wound Laterality: Right, Circumferential Cleanser Soap and Water Discharge Instruction: May shower and wash wound with dial antibacterial soap and water prior to dressing change. Wound Cleanser Discharge Instruction: Cleanse the wound with wound cleanser prior to applying a clean dressing using gauze sponges, not tissue or cotton balls. Peri-Wound Care cetaphil lotion Discharge Instruction: patient to provide to nurses to apply to both legs. Topical Gentamicin Discharge Instruction: As directed by physician Mupirocin Ointment Discharge Instruction: Apply Mupirocin (Bactroban) as instructed Primary Dressing KerraCel Ag Gelling Fiber Dressing, 4x5 in (silver alginate) Discharge Instruction: Apply silver alginate to wound bed as instructed Secondary Dressing ABD Pad, 8x10 Discharge Instruction: Apply over primary dressing as directed. Secured With Compression Wrap ThreePress (3 layer compression wrap)  Discharge Instruction: Apply three layer compression as directed. ***PLEASE USE KERLIX INSTEAD OF COTTON.CALAYA, SCHAB A ([id number]) [id number] Page 5 of 9 Compression Stockings Add-Ons Wound #7RR (Lower Leg) Wound Laterality: Left, Circumferential Cleanser Soap and Water Discharge Instruction: May shower and wash wound with  dial antibacterial soap and water prior to dressing change. Wound Cleanser Discharge Instruction: Cleanse the wound with wound cleanser prior to applying a clean dressing using gauze sponges, not tissue or cotton balls. Peri-Wound Care cetaphil lotion Discharge Instruction: patient to provide to nurses to apply to both legs. Topical Gentamicin Discharge Instruction: As directed by physician Mupirocin Ointment Discharge Instruction: Apply Mupirocin (Bactroban) as instructed Primary Dressing KerraCel Ag Gelling Fiber Dressing, 4x5 in (silver alginate) Discharge Instruction: Apply silver alginate to wound bed as instructed Secondary Dressing ABD Pad, 8x10 Discharge Instruction: Apply over primary dressing as directed. Secured With Compression Wrap ThreePress (3 layer compression wrap) Discharge Instruction: Apply three layer compression as directed. ***PLEASE USE KERLIX INSTEAD OF COTTON.*** Compression Stockings Add-Ons Electronic Signature(s) Signed: 01/02/2022 5:16:42 PM By: Kalman Shan DO Entered By: Kalman Shan on 01/02/2022 15:35:44 -------------------------------------------------------------------------------- Multi-Disciplinary Care Plan Details Patient Name: Date of Service: [name], [place]. 01/02/2022 1:15 PM Medical Record Number: [id number] Patient Account Number: [id number] Date of Birth/Sex: Treating RN: 1925/12/19 (88 y.o. Female) Rhae Hammock Primary Care Sarika Baldini: [name] Other Clinician: Referring Nayleah Gamel: Treating Acxel Dingee/Extender: [name] in Treatment: 54 [place] reviewed with physician Active Inactive Electronic Signature(s) Signed: 02/16/2022 9:38:30 AM By: Deon Pilling RN, BSN Signed: 03/10/2022 10:39:33 AM By: Rhae Hammock RN Previous Signature: 01/02/2022 5:16:09 PM Version By: Rhae Hammock RN Entered By: Deon Pilling on 02/16/2022 09:38:28 Roblero, Trenice A  ([id number] Page 6 of 9 -------------------------------------------------------------------------------- Pain Assessment Details Patient Name: Date of Service: [name], Melanie A. 01/02/2022 1:15 PM Medical Record Number: [id number] Patient Account Number: [id number] Date of Birth/Sex: Treating RN: 1925-12-28 (88 y.o. Female) Primary Care Shaylin Blatt: [name] Other Clinician: Referring Tylor Courtwright: Treating [place] Maulden/Extender: [name] in Treatment: 66 Active Problems Location of Pain Severity and Description of Pain Patient Has Paino No Site Locations Pain Management and Medication Current Pain Management: Electronic Signature(s) Signed: 01/02/2022 4:17:22 PM By: Erenest Blank Entered By: Erenest Blank on 01/02/2022 14:06:09 -------------------------------------------------------------------------------- Patient/Caregiver Education Details Patient Name: Date of Service: [name], Mechell A. 12/14/2023andnbsp1:15 PM Medical Record Number: [id number] Patient Account Number: [id number] Date of Birth/Gender: Treating RN: January 08, 1926 (88 y.o. Female) Rhae Hammock Primary Care Physician: [name] Other Clinician: Referring Physician: Treating Physician/Extender: [name] in Treatment: 47 Education Assessment Education Provided To: Patient Education Topics Provided Venous: Methods: Explain/Verbal Responses: State content correctly MELANDIE, WARTH A ([id number]) [id number] Page 7 of 9 Wound/Skin Impairment: Methods: Explain/Verbal Responses: State content correctly Electronic Signature(s) Signed: 01/02/2022 5:16:09 PM By: Rhae Hammock RN Entered By: Rhae Hammock on 01/02/2022 11:27:57 -------------------------------------------------------------------------------- Wound Assessment Details Patient Name: Date of  Service: [name], [place]. 01/02/2022 1:15 PM Medical Record Number: [id number] Patient Account Number: [id number] Date of Birth/Sex: Treating RN: [birth year] (88 y.o. Female) Primary Care Annalyssa Thune: [name] Other Clinician: Referring Takoda Janowiak: Treating Leman Martinek/Extender: [name] in Treatment: 64 Wound Status Wound Number: 20 Primary Lymphedema Etiology: Wound Location: Right, Circumferential Lower Leg Wound Open Wounding Event: Gradually Appeared Status: Date Acquired: 06/25/2021 Comorbid Anemia, Arrhythmia, Congestive Heart Failure, Hypertension, Weeks Of Treatment: 27 History: Colitis, Osteoarthritis Clustered Wound: Yes Photos Wound Measurements Length: (cm)  17.5 Width: (cm) 28 Depth: (cm) 0.1 Clustered Quantity: 4 Area: (cm) 384.845 Volume: (cm) 38.485 % Reduction in Area: 3.2% % Reduction in Volume: 3.2% Epithelialization: None Tunneling: No Undermining: No Wound Description Classification: Full Thickness Without Exposed Sup Wound Margin: Distinct, outline attached Exudate Amount: Large Exudate Type: Serosanguineous Exudate Color: red, brown port Structures Foul Odor After Cleansing: Yes Due to Product Use: No Slough/Fibrino Yes Wound Bed Granulation Amount: Large (67-100%) Exposed Structure Granulation Quality: Red, Pink Fascia Exposed: No Necrotic Amount: Small (1-33%) Fat Layer (Subcutaneous Tissue) Exposed: Yes Necrotic Quality: Adherent Slough Tendon Exposed: No Muscle Exposed: No Joint Exposed: No Bone Exposed: No Carstens, Delayne A ([id number] Page 8 of 9 Periwound Skin Texture Texture Color No Abnormalities Noted: No No Abnormalities Noted: No Callus: No Atrophie Blanche: No Crepitus: No Cyanosis: No Excoriation: No Ecchymosis: No Induration: No Erythema: No Rash: No Hemosiderin Staining: Yes Scarring: No Mottled: No Pallor: No Moisture Rubor: No No  Abnormalities Noted: No Dry / Scaly: No Temperature / Pain Maceration: Yes Temperature: No Abnormality Tenderness on Palpation: Yes Electronic Signature(s) Signed: 01/02/2022 4:17:22 PM By: Erenest Blank Entered By: Erenest Blank on 01/02/2022 14:33:42 -------------------------------------------------------------------------------- Wound Assessment Details Patient Name: Date of Service: [name], Tava A. 01/02/2022 1:15 PM Medical Record Number: [id number] Patient Account Number: [id number] Date of Birth/Sex: Treating RN: 09-Oct-1925 (88 y.o. Female) Primary Care Portia Wisdom: [name] Other Clinician: Referring Rosielee Corporan: Treating Romario Tith/Extender: [name] in Treatment: 6 Wound Status Wound Number: 7RR Primary Malignant Wound Etiology: Wound Location: Left, Circumferential Lower Leg Wound Open Wounding Event: Blister Status: Date Acquired: 04/20/2020 Comorbid Anemia, Arrhythmia, Congestive Heart Failure, Hypertension, Weeks Of Treatment: 81 History: Colitis, Osteoarthritis Clustered Wound: Yes Photos Wound Measurements Length: (cm) 20 Width: (cm) 18 Depth: (cm) 0.1 Clustered Quantity: 4 Area: (cm) 282.743 Volume: (cm) 28.274 % Reduction in Area: -9106.9% % Reduction in Volume: -9109.8% Epithelialization: None Tunneling: No Undermining: No Wound Description Classification: Full Thickness Without Exposed Support Structures Wound Margin: Distinct, outline attached Exudate Amount: Large Exudate Type: Serosanguineous Exudate Color: red, brown Vonderhaar, Jadis A ([id number]) Wound Bed Granulation Amount: Small (1-33%) Granulation Quality: Red, Pink Necrotic Amount: Large (67-100%) Necrotic Quality: Adherent Slough Foul Odor After Cleansing: Yes Due to Product Use: No Slough/Fibrino Yes [phone].pdf Page 9 of 9 Exposed Structure Fascia Exposed: No Fat Layer (Subcutaneous Tissue) Exposed:  Yes Tendon Exposed: No Muscle Exposed: No Joint Exposed: No Bone Exposed: No Periwound Skin Texture Texture Color No Abnormalities Noted: No No Abnormalities Noted: No Callus: No Atrophie Blanche: No Crepitus: No Cyanosis: No Excoriation: No Ecchymosis: No Induration: No Erythema: No Rash: No Hemosiderin Staining: Yes Scarring: No Mottled: No Pallor: No Moisture Rubor: No No Abnormalities Noted: No Dry / Scaly: Yes Temperature / Pain Maceration: Yes Temperature: No Abnormality Electronic Signature(s) Signed: 01/02/2022 4:17:22 PM By: Erenest Blank Entered By: Erenest Blank on 01/02/2022 14:34:58 -------------------------------------------------------------------------------- [place] Details Patient Name: Date of Service: [name], Serenna A. 01/02/2022 1:15 PM Medical Record Number: [id number] Patient Account Number: [id number] Date of Birth/Sex: Treating RN: 11-28-1925 (88 y.o. Female) Primary Care Thao Bauza: [name] Other Clinician: Referring Gizzelle Lacomb: Treating Monique Hefty/Extender: [name] in Treatment: 70 Vital Signs Time Taken: 14:05 Temperature (F): 97.6 Height (in): 65 Pulse (bpm): 65 Weight (lbs): 163 Respiratory Rate (breaths/min): 18 Body Mass Index (BMI): 27.1 Blood Pressure (mmHg): 109/63 Reference Range: 80 - 120 mg / dl Electronic Signature(s) Signed: 01/02/2022 4:17:22 PM By: Erenest Blank Entered By: Erenest Blank on  01/02/2022 14:06:03 

## 2022-01-03 DIAGNOSIS — C44722 Squamous cell carcinoma of skin of right lower limb, including hip: Secondary | ICD-10-CM | POA: Diagnosis not present

## 2022-01-03 DIAGNOSIS — I130 Hypertensive heart and chronic kidney disease with heart failure and stage 1 through stage 4 chronic kidney disease, or unspecified chronic kidney disease: Secondary | ICD-10-CM | POA: Diagnosis not present

## 2022-01-03 DIAGNOSIS — L97812 Non-pressure chronic ulcer of other part of right lower leg with fat layer exposed: Secondary | ICD-10-CM | POA: Diagnosis not present

## 2022-01-03 DIAGNOSIS — L97822 Non-pressure chronic ulcer of other part of left lower leg with fat layer exposed: Secondary | ICD-10-CM | POA: Diagnosis not present

## 2022-01-03 DIAGNOSIS — C44729 Squamous cell carcinoma of skin of left lower limb, including hip: Secondary | ICD-10-CM | POA: Diagnosis not present

## 2022-01-03 DIAGNOSIS — I87313 Chronic venous hypertension (idiopathic) with ulcer of bilateral lower extremity: Secondary | ICD-10-CM | POA: Diagnosis not present

## 2022-01-06 ENCOUNTER — Other Ambulatory Visit: Payer: Self-pay | Admitting: Internal Medicine

## 2022-01-06 NOTE — Telephone Encounter (Signed)
Patients daughter [name] called and wanted to know what patient needs to do next because there is some confusion on what medication she should be taking. Call back is [phone]. She also said that Alginate was what the wound center put on patients legs

## 2022-01-07 DIAGNOSIS — I87313 Chronic venous hypertension (idiopathic) with ulcer of bilateral lower extremity: Secondary | ICD-10-CM | POA: Diagnosis not present

## 2022-01-07 DIAGNOSIS — L97812 Non-pressure chronic ulcer of other part of right lower leg with fat layer exposed: Secondary | ICD-10-CM | POA: Diagnosis not present

## 2022-01-07 DIAGNOSIS — C44729 Squamous cell carcinoma of skin of left lower limb, including hip: Secondary | ICD-10-CM | POA: Diagnosis not present

## 2022-01-07 DIAGNOSIS — L97822 Non-pressure chronic ulcer of other part of left lower leg with fat layer exposed: Secondary | ICD-10-CM | POA: Diagnosis not present

## 2022-01-07 DIAGNOSIS — I130 Hypertensive heart and chronic kidney disease with heart failure and stage 1 through stage 4 chronic kidney disease, or unspecified chronic kidney disease: Secondary | ICD-10-CM | POA: Diagnosis not present

## 2022-01-07 DIAGNOSIS — C44722 Squamous cell carcinoma of skin of right lower limb, including hip: Secondary | ICD-10-CM | POA: Diagnosis not present

## 2022-01-07 NOTE — Telephone Encounter (Signed)
Called daughter she states insurance denied the [place]. Mom only received 6 from the [facility] so now she is not taking anything. Does MD want her to go back on the Doxycycline

## 2022-01-07 NOTE — Telephone Encounter (Signed)
What was the confusion? Noted about topical Alginate.

## 2022-01-08 ENCOUNTER — Telehealth: Payer: Self-pay | Admitting: *Deleted

## 2022-01-08 ENCOUNTER — Encounter (HOSPITAL_COMMUNITY): Payer: Self-pay | Admitting: Internal Medicine

## 2022-01-08 ENCOUNTER — Emergency Department (HOSPITAL_COMMUNITY): Payer: Medicare Other

## 2022-01-08 ENCOUNTER — Inpatient Hospital Stay (HOSPITAL_COMMUNITY)
Admission: EM | Admit: 2022-01-08 | Discharge: 2022-01-14 | DRG: 689 | Disposition: A | Discharge status: Skilled Nursing Facility | Payer: Medicare Other | Source: Skilled Nursing Facility | Attending: Internal Medicine | Admitting: Internal Medicine

## 2022-01-08 ENCOUNTER — Other Ambulatory Visit: Payer: Self-pay

## 2022-01-08 DIAGNOSIS — I10 Essential (primary) hypertension: Secondary | ICD-10-CM | POA: Diagnosis not present

## 2022-01-08 DIAGNOSIS — L03116 Cellulitis of left lower limb: Secondary | ICD-10-CM | POA: Diagnosis not present

## 2022-01-08 DIAGNOSIS — I4819 Other persistent atrial fibrillation: Secondary | ICD-10-CM | POA: Diagnosis present

## 2022-01-08 DIAGNOSIS — Z79890 Hormone replacement therapy (postmenopausal): Secondary | ICD-10-CM

## 2022-01-08 DIAGNOSIS — N183 Chronic kidney disease, stage 3 unspecified: Secondary | ICD-10-CM | POA: Diagnosis present

## 2022-01-08 DIAGNOSIS — G9340 Encephalopathy, unspecified: Secondary | ICD-10-CM | POA: Diagnosis not present

## 2022-01-08 DIAGNOSIS — I5032 Chronic diastolic (congestive) heart failure: Secondary | ICD-10-CM | POA: Diagnosis present

## 2022-01-08 DIAGNOSIS — N390 Urinary tract infection, site not specified: Secondary | ICD-10-CM | POA: Diagnosis present

## 2022-01-08 DIAGNOSIS — Z888 Allergy status to other drugs, medicaments and biological substances status: Secondary | ICD-10-CM | POA: Diagnosis not present

## 2022-01-08 DIAGNOSIS — I4892 Unspecified atrial flutter: Secondary | ICD-10-CM | POA: Diagnosis present

## 2022-01-08 DIAGNOSIS — Z85828 Personal history of other malignant neoplasm of skin: Secondary | ICD-10-CM | POA: Diagnosis not present

## 2022-01-08 DIAGNOSIS — L97919 Non-pressure chronic ulcer of unspecified part of right lower leg with unspecified severity: Secondary | ICD-10-CM | POA: Diagnosis present

## 2022-01-08 DIAGNOSIS — E860 Dehydration: Secondary | ICD-10-CM | POA: Diagnosis present

## 2022-01-08 DIAGNOSIS — Z853 Personal history of malignant neoplasm of breast: Secondary | ICD-10-CM

## 2022-01-08 DIAGNOSIS — Z66 Do not resuscitate: Secondary | ICD-10-CM | POA: Diagnosis present

## 2022-01-08 DIAGNOSIS — K279 Peptic ulcer, site unspecified, unspecified as acute or chronic, without hemorrhage or perforation: Secondary | ICD-10-CM | POA: Diagnosis not present

## 2022-01-08 DIAGNOSIS — G928 Other toxic encephalopathy: Secondary | ICD-10-CM | POA: Diagnosis present

## 2022-01-08 DIAGNOSIS — Z881 Allergy status to other antibiotic agents status: Secondary | ICD-10-CM | POA: Diagnosis not present

## 2022-01-08 DIAGNOSIS — N179 Acute kidney failure, unspecified: Secondary | ICD-10-CM | POA: Diagnosis present

## 2022-01-08 DIAGNOSIS — M6281 Muscle weakness (generalized): Secondary | ICD-10-CM | POA: Diagnosis not present

## 2022-01-08 DIAGNOSIS — I2510 Atherosclerotic heart disease of native coronary artery without angina pectoris: Secondary | ICD-10-CM | POA: Diagnosis not present

## 2022-01-08 DIAGNOSIS — Z981 Arthrodesis status: Secondary | ICD-10-CM

## 2022-01-08 DIAGNOSIS — K219 Gastro-esophageal reflux disease without esophagitis: Secondary | ICD-10-CM | POA: Diagnosis not present

## 2022-01-08 DIAGNOSIS — I878 Other specified disorders of veins: Secondary | ICD-10-CM | POA: Diagnosis present

## 2022-01-08 DIAGNOSIS — N1831 Chronic kidney disease, stage 3a: Secondary | ICD-10-CM | POA: Diagnosis not present

## 2022-01-08 DIAGNOSIS — L03115 Cellulitis of right lower limb: Secondary | ICD-10-CM | POA: Diagnosis not present

## 2022-01-08 DIAGNOSIS — I5030 Unspecified diastolic (congestive) heart failure: Secondary | ICD-10-CM | POA: Diagnosis not present

## 2022-01-08 DIAGNOSIS — Z96641 Presence of right artificial hip joint: Secondary | ICD-10-CM | POA: Diagnosis present

## 2022-01-08 DIAGNOSIS — R627 Adult failure to thrive: Secondary | ICD-10-CM | POA: Diagnosis not present

## 2022-01-08 DIAGNOSIS — E039 Hypothyroidism, unspecified: Secondary | ICD-10-CM | POA: Diagnosis present

## 2022-01-08 DIAGNOSIS — R531 Weakness: Secondary | ICD-10-CM | POA: Diagnosis not present

## 2022-01-08 DIAGNOSIS — I872 Venous insufficiency (chronic) (peripheral): Secondary | ICD-10-CM | POA: Diagnosis not present

## 2022-01-08 DIAGNOSIS — F419 Anxiety disorder, unspecified: Secondary | ICD-10-CM | POA: Diagnosis present

## 2022-01-08 DIAGNOSIS — B964 Proteus (mirabilis) (morganii) as the cause of diseases classified elsewhere: Secondary | ICD-10-CM | POA: Diagnosis present

## 2022-01-08 DIAGNOSIS — R41841 Cognitive communication deficit: Secondary | ICD-10-CM | POA: Diagnosis not present

## 2022-01-08 DIAGNOSIS — I7389 Other specified peripheral vascular diseases: Secondary | ICD-10-CM | POA: Diagnosis not present

## 2022-01-08 DIAGNOSIS — I83019 Varicose veins of right lower extremity with ulcer of unspecified site: Secondary | ICD-10-CM | POA: Diagnosis present

## 2022-01-08 DIAGNOSIS — A4159 Other Gram-negative sepsis: Principal | ICD-10-CM | POA: Diagnosis present

## 2022-01-08 DIAGNOSIS — Z7901 Long term (current) use of anticoagulants: Secondary | ICD-10-CM

## 2022-01-08 DIAGNOSIS — Z79899 Other long term (current) drug therapy: Secondary | ICD-10-CM

## 2022-01-08 DIAGNOSIS — Z1152 Encounter for screening for COVID-19: Secondary | ICD-10-CM | POA: Diagnosis not present

## 2022-01-08 DIAGNOSIS — D649 Anemia, unspecified: Secondary | ICD-10-CM | POA: Diagnosis not present

## 2022-01-08 DIAGNOSIS — Z7401 Bed confinement status: Secondary | ICD-10-CM | POA: Diagnosis not present

## 2022-01-08 DIAGNOSIS — R319 Hematuria, unspecified: Secondary | ICD-10-CM | POA: Diagnosis not present

## 2022-01-08 DIAGNOSIS — R278 Other lack of coordination: Secondary | ICD-10-CM | POA: Diagnosis not present

## 2022-01-08 DIAGNOSIS — Z885 Allergy status to narcotic agent status: Secondary | ICD-10-CM

## 2022-01-08 DIAGNOSIS — E785 Hyperlipidemia, unspecified: Secondary | ICD-10-CM | POA: Diagnosis present

## 2022-01-08 DIAGNOSIS — D509 Iron deficiency anemia, unspecified: Secondary | ICD-10-CM | POA: Diagnosis present

## 2022-01-08 DIAGNOSIS — R2681 Unsteadiness on feet: Secondary | ICD-10-CM | POA: Diagnosis not present

## 2022-01-08 DIAGNOSIS — I130 Hypertensive heart and chronic kidney disease with heart failure and stage 1 through stage 4 chronic kidney disease, or unspecified chronic kidney disease: Secondary | ICD-10-CM | POA: Diagnosis present

## 2022-01-08 DIAGNOSIS — R2689 Other abnormalities of gait and mobility: Secondary | ICD-10-CM | POA: Diagnosis not present

## 2022-01-08 DIAGNOSIS — C4490 Unspecified malignant neoplasm of skin, unspecified: Secondary | ICD-10-CM | POA: Diagnosis present

## 2022-01-08 DIAGNOSIS — R413 Other amnesia: Secondary | ICD-10-CM | POA: Diagnosis present

## 2022-01-08 HISTORY — DX: Pneumonia, unspecified organism: J18.9

## 2022-01-08 LAB — CBC
HCT: 29.3 % — ABNORMAL LOW (ref 36.0–46.0)
Hemoglobin: 10.8 g/dL — ABNORMAL LOW (ref 12.0–15.0)
MCH: 39.9 pg — ABNORMAL HIGH (ref 26.0–34.0)
MCHC: 36.9 g/dL — ABNORMAL HIGH (ref 30.0–36.0)
MCV: 108.1 fL — ABNORMAL HIGH (ref 80.0–100.0)
Platelets: 322 10*3/uL (ref 150–400)
RBC: 2.71 MIL/uL — ABNORMAL LOW (ref 3.87–5.11)
RDW: 14.7 % (ref 11.5–15.5)
WBC: 7.1 10*3/uL (ref 4.0–10.5)
nRBC: 0 % (ref 0.0–0.2)

## 2022-01-08 LAB — URINALYSIS, ROUTINE W REFLEX MICROSCOPIC
Bilirubin Urine: NEGATIVE
Glucose, UA: NEGATIVE mg/dL
Hgb urine dipstick: NEGATIVE
Ketones, ur: NEGATIVE mg/dL
Nitrite: NEGATIVE
Protein, ur: NEGATIVE mg/dL
Specific Gravity, Urine: 1.004 — ABNORMAL LOW (ref 1.005–1.030)
WBC, UA: >50 WBC/hpf — ABNORMAL HIGH (ref 0–5)
pH: 7 (ref 5.0–8.0)

## 2022-01-08 LAB — COMPREHENSIVE METABOLIC PANEL
ALT: 17 U/L (ref 0–44)
AST: 33 U/L (ref 15–41)
Albumin: 2.4 g/dL — ABNORMAL LOW (ref 3.5–5.0)
Alkaline Phosphatase: 61 U/L (ref 38–126)
Anion gap: 11 (ref 5–15)
BUN: 21 mg/dL (ref 8–23)
CO2: 27 mmol/L (ref 22–32)
Calcium: 8.8 mg/dL — ABNORMAL LOW (ref 8.9–10.3)
Chloride: 98 mmol/L (ref 98–111)
Creatinine, Ser: 1.18 mg/dL — ABNORMAL HIGH (ref 0.44–1.00)
GFR, Estimated: 43 mL/min — ABNORMAL LOW (ref >60)
Glucose, Bld: 110 mg/dL — ABNORMAL HIGH (ref 70–99)
Potassium: 4.2 mmol/L (ref 3.5–5.1)
Sodium: 136 mmol/L (ref 135–145)
Total Bilirubin: 1 mg/dL (ref 0.3–1.2)
Total Protein: 6.4 g/dL — ABNORMAL LOW (ref 6.5–8.1)

## 2022-01-08 LAB — RESP PANEL BY RT-PCR (RSV, FLU A&B, COVID)  RVPGX2
Influenza A by PCR: NEGATIVE
Influenza B by PCR: NEGATIVE
Resp Syncytial Virus by PCR: NEGATIVE
SARS Coronavirus 2 by RT PCR: NEGATIVE

## 2022-01-08 LAB — BRAIN NATRIURETIC PEPTIDE: B Natriuretic Peptide: 504 pg/mL — ABNORMAL HIGH (ref 0.0–100.0)

## 2022-01-08 LAB — TROPONIN I (HIGH SENSITIVITY)
Troponin I (High Sensitivity): 22 ng/L — ABNORMAL HIGH (ref <18)
Troponin I (High Sensitivity): 16 ng/L (ref <18)

## 2022-01-08 LAB — TSH: TSH: 0.885 u[IU]/mL (ref 0.350–4.500)

## 2022-01-08 LAB — PHOSPHORUS: Phosphorus: 3.2 mg/dL (ref 2.5–4.6)

## 2022-01-08 LAB — MAGNESIUM: Magnesium: 1.8 mg/dL (ref 1.7–2.4)

## 2022-01-08 MED ORDER — LEVOTHYROXINE SODIUM 25 MCG PO TABS
25.0000 ug | ORAL_TABLET | Freq: Every day | ORAL | Status: DC
  Administered 2022-01-09 – 2022-01-14 (×6): 25 ug via ORAL
  Filled 2022-01-08 (×5): qty 1

## 2022-01-08 MED ORDER — DILTIAZEM HCL ER COATED BEADS 120 MG PO CP24
120.0000 mg | ORAL_CAPSULE | Freq: Every day | ORAL | Status: DC
  Administered 2022-01-09 – 2022-01-14 (×6): 120 mg via ORAL
  Filled 2022-01-08 (×6): qty 1

## 2022-01-08 MED ORDER — METOPROLOL SUCCINATE ER 25 MG PO TB24
25.0000 mg | ORAL_TABLET | Freq: Every day | ORAL | Status: DC
  Administered 2022-01-09 – 2022-01-14 (×6): 25 mg via ORAL
  Filled 2022-01-08 (×6): qty 1

## 2022-01-08 MED ORDER — APIXABAN 5 MG PO TABS
5.0000 mg | ORAL_TABLET | Freq: Two times a day (BID) | ORAL | Status: DC
  Administered 2022-01-08 – 2022-01-14 (×12): 5 mg via ORAL
  Filled 2022-01-08 (×12): qty 1

## 2022-01-08 MED ORDER — SODIUM CHLORIDE 0.9% FLUSH
3.0000 mL | Freq: Two times a day (BID) | INTRAVENOUS | Status: DC
  Administered 2022-01-08 – 2022-01-14 (×12): 3 mL via INTRAVENOUS

## 2022-01-08 MED ORDER — POLYETHYLENE GLYCOL 3350 17 G PO PACK
17.0000 g | PACK | Freq: Every day | ORAL | Status: DC | PRN
  Administered 2022-01-12: 17 g via ORAL
  Filled 2022-01-08: qty 1

## 2022-01-08 MED ORDER — OMADACYCLINE TOSYLATE 150 MG PO TABS: 300.0000 mg | ORAL_TABLET | Freq: Every day | ORAL | Status: DC

## 2022-01-08 MED ORDER — ACETAMINOPHEN 325 MG PO TABS
650.0000 mg | ORAL_TABLET | Freq: Four times a day (QID) | ORAL | Status: DC | PRN
  Administered 2022-01-09 – 2022-01-13 (×4): 650 mg via ORAL
  Filled 2022-01-08 (×4): qty 2

## 2022-01-08 MED ORDER — FAMOTIDINE 20 MG PO TABS
10.0000 mg | ORAL_TABLET | Freq: Every day | ORAL | Status: DC
  Administered 2022-01-09 – 2022-01-14 (×6): 10 mg via ORAL
  Filled 2022-01-08 (×6): qty 1

## 2022-01-08 MED ORDER — LACTATED RINGERS IV BOLUS
1000.0000 mL | Freq: Once | INTRAVENOUS | Status: AC
  Administered 2022-01-08: 1000 mL via INTRAVENOUS

## 2022-01-08 MED ORDER — ACETAMINOPHEN 650 MG RE SUPP: 650.0000 mg | Freq: Four times a day (QID) | RECTAL | Status: DC | PRN

## 2022-01-08 MED ORDER — SODIUM CHLORIDE 0.9 % IV SOLN
2.0000 g | INTRAVENOUS | Status: AC
  Administered 2022-01-09 – 2022-01-12 (×4): 2 g via INTRAVENOUS
  Filled 2022-01-08 (×4): qty 20

## 2022-01-08 MED ORDER — LACTATED RINGERS IV BOLUS
500.0000 mL | Freq: Once | INTRAVENOUS | Status: AC
  Administered 2022-01-08: 500 mL via INTRAVENOUS

## 2022-01-08 MED ORDER — SODIUM CHLORIDE 0.9 % IV SOLN
2.0000 g | Freq: Once | INTRAVENOUS | Status: AC
  Administered 2022-01-08: 2 g via INTRAVENOUS
  Filled 2022-01-08: qty 20

## 2022-01-08 NOTE — ED Notes (Signed)
The daug. Which is the POA would like to speak to the social worker about her mother returning to  her facility  please call  [place]

## 2022-01-08 NOTE — ED Notes (Signed)
Unable to obtain second set of blood cultures.

## 2022-01-08 NOTE — ED Provider Notes (Signed)
[facility]ERGENC[facility] DEPARTMENT Provider Note   CSN: [id number] Arrival date & time: 01/08/22  1142     History  No chief complaint on file.   TONYETTA BERKO is a 88 y.o. female.  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  The patient denies any specific infectious symptoms.  She endorses roughly 3 days of decreased to minimal p.o. intake.  She lives alone but [facility].  She has been staying in bed most of the day since this past Saturday and has been feeling generalized weakness and fatigue.  She denies any dysuria or increased urinary frequency.  She denies any cough, shortness of breath, chest pain, abdominal pain, nausea, vomiting, diarrhea.   Home Medications Prior to Admission medications   Medication Sig Start Date End Date Taking? Authorizing Provider  acetaminophen (TYLENOL) 500 MG tablet Take 500 mg by mouth daily as needed for mild pain.     [provider]  apixaban (ELIQUIS) 5 MG TABS tablet Take 1 tablet (5 mg total) by mouth 2 (two) times daily. 11/11/21   Burnell Blanks, MD  B Complex-C (B-COMPLEX WITH VITAMIN C) tablet Take 1 tablet by mouth daily.    [provider]  busPIRone (BUSPAR) 15 MG tablet TAKE 1 TABLET BY MOUTH THREE TIMES A DAY 09/19/21   [name], [name], MD  CEMIPLIMAB-RWLC IV Inject 1 Dose into the vein every 21 ( twenty-one) days.    [provider]  Cholecalciferol (VITAMIN D3) 1000 UNITS tablet Take 1,000 Units by mouth daily.    [provider]  diltiazem (CARDIZEM CD) 120 MG 24 hr capsule TAKE 1 CAPSULE BY  MOUTH ONCE DAILY 02/28/21   [name], [name], MD  doxycycline (VIBRA-TABS) 100 MG tablet TAKE 1 TABLET BY MOUTH ONCE DAILY 12/10/21   [name], [name], MD  famotidine (PEPCID) 20 MG tablet Take 20 mg by mouth daily.    [provider]  furosemide (LASIX) 40 MG tablet TAKE 1 TABLET BY MOUTH TWICE DAILY 11/11/21   [name], [name], MD  gabapentin (NEURONTIN) 100 MG capsule TAKE 1  CAPSULE BY MOUTH EVERY NIGHT AT BEDTIME *DO NOT CRUSH OR CHEW* 11/11/21   [name], [name], MD  hydrocortisone 2.5 % lotion Apply topically 3 (three) times daily. 11/06/20   [name], [name], MD  levothyroxine (SYNTHROID) 25 MCG tablet Take 1 tablet (25 mcg total) by mouth daily before breakfast. 11/14/21   [name], [name], MD  lidocaine-prilocaine (EMLA) cream Apply 1 Application topically as needed. As directed 1 h before procedure 09/17/21   [name], [name], MD  metoprolol succinate (TOPROL-XL) 50 MG 24 hr tablet TAKE 1/2 TABLET = 25 MG BY MOUTH TWICE DAILY. TAKE WITH OR IMMEDIATELY FOLLOWING A MEAL 03/26/21   [name], [name], MD  Multiple Vitamins-Minerals (MULTIVITAMIN WITH MINERALS) tablet Take 1 tablet by mouth daily.    [provider]  Omadacycline Tosylate (NUZYRA) 150 MG TABS Take 450 mg qd x 2 days (samples), then 300 mg daily x 14 days total 12/31/21   [name], [name], MD  polyethylene glycol (MIRALAX) 17 g packet Take 17 g by mouth daily. 10/15/18  Maudie Flakes, MD  potassium chloride SA (KLOR-CON M) 20 MEQ tablet TAKE 1 TABLET BY MOUTH ONCE DAILY  ALONG W/ FUROSEMIDE AS NEED *TAKE WITH FOOD* *DO NOT CRUSH OR CHEW* *MAY DISSOLVE* 02/28/21   [name], [name], MD  temazepam (RESTORIL) 15 MG capsule TAKE 1 CAPSULE BY MOUTH AT BEDTIME AS NEEDED FOR SLEEP 09/15/21   [name], [name], MD      Allergies    Zosyn [piperacillin sod-tazobactam so], Atorvastatin, Zoloft [sertraline], Atenolol, Clarithromycin, Codeine sulfate, Levaquin [levofloxacin],  Macrodantin, and Oxycodone-acetaminophen    Review of Systems   Review of Systems  Constitutional:  Positive for fatigue.  Neurological:  Positive for weakness.    Physical Exam Updated Vital Signs BP (!) 116/57   Pulse 89   Temp 97.7 F (36.5 C) (Oral)   Resp (!) 25   SpO2 90%  Physical Exam Vitals and nursing note reviewed.  Constitutional:      General: She is not in acute distress.    Appearance: She is well-developed.  HENT:     Head: Normocephalic and atraumatic.  Eyes:     Conjunctiva/sclera: Conjunctivae normal.  Cardiovascular:     Rate and Rhythm: Normal rate and regular rhythm.     Pulses: Normal pulses.  Pulmonary:     Effort: Pulmonary effort is normal. No respiratory distress.     Breath sounds: Normal breath sounds.  Abdominal:     Palpations: Abdomen is soft.     Tenderness: There is no abdominal tenderness.  Musculoskeletal:        General: No swelling.     Cervical back: Neck supple.     Comments: Bilateral lower extremities wrapped, subsequently unwrapped with chronic appearing wounds without clear evidence of cellulitis or abscess, distal pulses intact  Skin:    General: Skin is warm and dry.     Capillary Refill: Capillary refill takes less than 2 seconds.  Neurological:     Mental Status: She is alert.     Comments: MENTAL STATUS EXAM:    Orientation: Alert and oriented to person, place and time.  Memory: Cooperative, follows commands well.  Language: Speech is clear and language is normal.   CRANIAL NERVES:    CN 2 (Optic): Visual fields intact to confrontation.  CN 3,4,6 (EOM): Pupils equal and reactive to light. Full extraocular eye movement without nystagmus.  CN 5 (Trigeminal): Facial sensation is normal, no weakness of masticatory muscles.  CN 7 (Facial): No facial weakness or asymmetry.  CN 8 (Auditory): Auditory acuity grossly normal.  CN 9,10 (Glossophar): The uvula is midline, the palate elevates symmetrically.  CN 11 (spinal  access): Normal sternocleidomastoid and trapezius strength.  CN 12 (Hypoglossal): The tongue is midline. No atrophy or fasciculations.[name]   MOTOR:  Muscle Strength: 5/5RUE, 5/5LUE, 5/5RLE, 5/5LLE.   COORDINATION:   No tremor.   SENSATION:   Intact to light touch all four extremities.     Psychiatric:        Mood and Affect: Mood normal.     ED Results / Procedures / Treatments   Labs (all labs ordered are listed, but only abnormal results are displayed) Labs Reviewed  COMPREHENSIVE METABOLIC PANEL - Abnormal; Notable for the following components:      Result Value   Glucose, Bld 110 (*)    Creatinine, Ser 1.18 (*)    Calcium 8.8 (*)    Total Protein 6.4 (*)    Albumin 2.4 (*)    GFR, Estimated 43 (*)  All other components within normal limits  CBC - Abnormal; Notable for the following components:   RBC 2.71 (*)    Hemoglobin 10.8 (*)    HCT 29.3 (*)    MCV 108.1 (*)    MCH 39.9 (*)    MCHC 36.9 (*)    All other components within normal limits  BRAIN NATRIURETIC PEPTIDE - Abnormal; Notable for the following components:   B Natriuretic Peptide 504.0 (*)    All other components within normal limits  TROPONIN I (HIGH SENSITIVITY) - Abnormal; Notable for the following components:   Troponin I (High Sensitivity) 22 (*)    All other components within normal limits  RESP PANEL BY RT-PCR (RSV, FLU A&B, COVID)  RVPGX2  TSH  URINALYSIS, ROUTINE W REFLEX MICROSCOPIC  PHOSPHORUS  MAGNESIUM  TROPONIN I (HIGH SENSITIVITY)    EKG EKG Interpretation  Date/Time:  Wednesday January 08 2022 11:56:22 EST Ventricular Rate:  88 PR Interval:    QRS Duration: 133 QT Interval:  356 QTC Calculation: 431 R Axis:   24 Text Interpretation: Atrial fibrillation Ventricular premature complex Right bundle branch block Confirmed by Regan Lemming ([id number]) on 01/08/2022 12:02:59 PM  Radiology DG Chest Port 1 View  Result Date: 01/08/2022 CLINICAL DATA:  Upper respiratory tract symptoms.   Weakness. EXAM: PORTABLE CHEST 1 VIEW COMPARISON:  10/07/2018 FINDINGS: Mild cardiac enlargement. Aortic atherosclerotic calcifications. No pleural effusion or edema. No airspace disease. The visualized osseous structures are unremarkable. IMPRESSION: Mild cardiac enlargement.  No acute findings. Electronically Signed   By: Kerby Moors M.D.   On: 01/08/2022 12:44    Procedures Procedures    Medications Ordered in ED Medications  lactated ringers bolus 500 mL (500 mLs Intravenous New Bag/Given 01/08/22 1547)    ED Course/ Medical Decision Making/ A&P                           Medical Decision Making Amount and/or Complexity of Data Reviewed Labs: ordered. Radiology: ordered.    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  The patient denies any specific infectious symptoms.  She endorses roughly 3 days of decreased to minimal p.o. intake.  She lives alone but [facility].  She has been staying in bed most of the day since this past Saturday and has been feeling generalized weakness and fatigue.  She denies any dysuria or increased urinary frequency.  She denies any cough, shortness of breath, chest pain, abdominal pain, nausea, vomiting, diarrhea.   On arrival, the patient was afebrile, vitally stable, not tachycardic or tachypneic, saturating well on room air.  Physical exam generally unremarkable with a normal neurologic exam.  Primarily concerning for bilateral chronic lower extremity wounds from combination of lymphedema and skin breakdown and known squamous cell carcinoma.  The patient follows outpatient with wound care  for this and has a wound care nurse come 3 times a week to her assisted living facility for dressing changes.  She has nonspecific symptoms of fatigue and generalized weakness and has not been eating or drinking.  Differential diagnosis includes electrolyte abnormality, dehydration, ACS, COVID-19, influenza, other viral infection, less likely cellulitis, abdomen soft,  nontender, nondistended, no concern for intra-abdominal infection.  Considered UTI.  Stated thyroid dysfunction.  EKG was performed revealed atrial fibrillation, ventricular rate 88, PVC present, right bundle branch block present, no acute ischemic changes.  Chest x-ray was performed which revealed cardiomegaly, no acute cardiac or pulmonary abnormality, no evidence of pulmonary edema.  Laboratory evaluation significant for an elevated BNP to 504 without baseline for comparison, initial troponin elevated to 22, CBC without leukocytosis, hemoglobin anemic but at baseline at 10.8, CMP without significant electrolyte abnormality, no AKI with a creatinine of 1.18, normal liver function.  COVID-19, influenza, RSV PCR testing was collected and resulted negative, TSH collected and normal.  Patient pending urinalysis, magnesium, repeat troponin, phosphorus.  Plan to reassess the patient following IV fluid bolus and consider involvement of social work and wound ostomy for wound care.  Patient could benefit from escalation of care to skilled nursing facility.  If no indication for admission is found, recommend [facility] consult for consideration for placement.  Signout given to Dr. Doren Custard at [phone].  Final Clinical Impression(s) / ED Diagnoses Final diagnoses:  Failure to thrive in adult  Generalized weakness    Rx / DC Orders ED Discharge Orders     None         Regan Lemming, MD 01/08/22 [phone]

## 2022-01-08 NOTE — Plan of Care (Signed)

## 2022-01-08 NOTE — H&P (Signed)
History and Physical   [name] OJJ:[id number] DOB: 12-24-1925 DOA: 01/08/2022  PCP: [name], MD   Patient coming from: Independent living facility  Chief Complaint: Generalized weakness  HPI: [name] is a 88 y.o. female with medical history significant of memory loss, skin cancer, peptic ulcer disease, anemia, hypothyroidism, hypertension, diverticulosis, CKD 3, venous insufficiency, atrial fibrillation, diastolic CHF presenting with generalized weakness.  Patient had been fairly independent at her independent living facility but for the past several days she has had significant worsening fatigue and generalized weakness.  Also has had decreased p.o. intake in addition to staying her bed most of the day in the last couple days.  She does live alone at this facility but get some home health 3 times a week.  She has chronic lower extremity ulcers from her venous stasis and recently started treatment for possible cellulitis about a week ago.  Started on a Omadacyclin.  She denies fevers, chills, chest pain, shortness breath, abdominal pain, constipation, diarrhea, nausea, vomiting.  ED Course: Vital signs in the ED significant for blood pressure in the 937J to 696V systolic, respiratory rate in the teens to 20s.  Lab workup included CMP with creatinine near baseline at 1.18, glucose 110, calcium 8.8, protein 6.4, albumin 2.4.  CBC with anemia stable 10.8.  BNP mildly elevated at 504.  Troponin initially mildly elevated at 22 but normal on repeat.  TSH normal.  Rester panel for flu and COVID-negative.  Urinalysis with leukocytes and bacteria.  Urine culture and blood culture pending.  Magnesium and phosphorus normal.  Chest x-ray with mild cardiomegaly but no acute normality.  Patient received dose of ceftriaxone, 1.5 L of IV fluids in the ED.  Review of Systems: As per HPI otherwise all other systems reviewed and are negative.  Past Medical History:  Diagnosis Date    Anxiety    denies   Atrial flutter ([place])    Bilateral edema of lower extremity    Breast cancer (HCC)    left breast   Chronic constipation    Chronic diastolic CHF (congestive heart failure) ([place])    Community acquired pneumonia of left lung    5/19 inpatient Rx   Diverticulosis of colon    GERD (gastroesophageal reflux disease)    H/O hiatal hernia    History of cellulitis    LEFT LOWER LEG --  SEPT 2015   History of colitis    DX  2003  WITH ISCHEMIC COLITIS   History of diverticulitis of colon    perferated diverticulitis w/ abscess 08-25-2013 drain placed --  resolved without surgical intervention   History of GI bleed    2011--- UPPER SECONARY GASTRIC ULCER FROM NSAID USE   History of Helicobacter pylori infection    2011   History of ulcer of lower limb    2012   RIGHT LOWER EXTREMITIY--  STATSIS ULCER   Hyperlipidemia    Hypertension    Hypothyroidism    Iron deficiency anemia 2011   elevated MCV   LBP (low back pain)    severe lumbar spondylosis s/p L3/L4,L4/L5 fusion 12/10   Lumbar spondylolysis    OA (osteoarthritis)    "hands" (08/25/2013)   Open wound of left lower leg    Osteopenia    Persistent atrial fibrillation (HCC)    CARDIOLOGIST-   DR MCALANY   RBBB    Urinary frequency     Past Surgical History:  Procedure Laterality Date   APPLICATION  OF A-CELL OF EXTREMITY Left 06/21/2013   Procedure: APPLICATION OF A-CELL OF EXTREMITY;  Surgeon: Theodoro Kos, DO;  Location: [place];  Service: Plastics;  Laterality: Left;   APPLICATION OF A-CELL OF EXTREMITY Left 08/22/2013   Procedure: APPLICATION OF A-CELL/VAC TO LOWER LEFT LEG WOUND;  Surgeon: Theodoro Kos, DO;  Location: [facility];  Service: Plastics;  Laterality: Left;   APPLICATION OF A-CELL OF EXTREMITY Left 02/06/2014   Procedure: WITH PLACEMENT OF A -CELL ;  Surgeon: Theodoro Kos, DO;  Location: [facility];  Service: Plastics;  Laterality: Left;   APPLICATION OF A-CELL OF  EXTREMITY Left 08/09/2014   Procedure: PLACEMENT OF A CELL;  Surgeon: Theodoro Kos, DO;  Location: [facility];  Service: Plastics;  Laterality: Left;   APPLICATION OF WOUND VAC Left 06/16/2013   Procedure: APPLICATION OF WOUND VAC;  Surgeon: Meredith Pel, MD;  Location: [facility] [place];  Service: Orthopedics;  Laterality: Left;   APPLICATION OF WOUND VAC Left 06/21/2013   Procedure: APPLICATION OF WOUND VAC;  Surgeon: Theodoro Kos, DO;  Location: [place];  Service: Plastics;  Laterality: Left;   BIOPSY BREAST  07/25/2015   LEFT RADIOACTIVE SEED GUIDED EXCISIONAL BREAST BIOPSY (Left) as a surgical intervention   EXTERNAL FIXATION LEG Left 06/16/2013   Procedure: EXTERNAL FIXATION LEG;  Surgeon: Meredith Pel, MD;  Location: [facility];  Service: Orthopedics;  Laterality: Left;   EXTERNAL FIXATION REMOVAL Left 06/21/2013   Procedure: REMOVAL EXTERNAL FIXATION LEG;  Surgeon: Meredith Pel, MD;  Location: [place];  Service: Orthopedics;  Laterality: Left;   EYE SURGERY Bilateral    cataract removal   HARDWARE REMOVAL Left 05/23/2014   Procedure: HARDWARE REMOVAL;  Surgeon: Meredith Pel, MD;  Location: [place];  Service: Orthopedics;  Laterality: Left;  REMOVAL OF HARDWARE LEFT TIBIA   I & D EXTREMITY Left 06/16/2013   Procedure: IRRIGATION AND DEBRIDEMENT EXTREMITY;  Surgeon: Meredith Pel, MD;  Location: [place];  Service: Orthopedics;  Laterality: Left;   I & D EXTREMITY Left 02/06/2014   Procedure: IRRIGATION AND DEBRIDEMENT LEFT LEG WOUND ;  Surgeon: Theodoro Kos, DO;  Location: [place];  Service: Plastics;  Laterality: Left;   I & D EXTREMITY Left 08/09/2014   Procedure: IRRIGATION AND DEBRIDEMENT  OF LEFT LOWER LEG;  Surgeon: Theodoro Kos, DO;  Location: [place];  Service: Plastics;  Laterality: Left;   LUMBAR FUSION  01-02-2009   L3-L5   RADIOACTIVE SEED GUIDED EXCISIONAL BREAST BIOPSY Left 07/25/2015   Procedure: LEFT RADIOACTIVE SEED GUIDED EXCISIONAL BREAST BIOPSY;  Surgeon:  Rolm Bookbinder, MD;  Location: [place];  Service: General;  Laterality: Left;   RE-EXCISION OF BREAST LUMPECTOMY Left 09/05/2015   Procedure: RE-EXCISION OF LEFT BREAST LUMPECTOMY;  Surgeon: Rolm Bookbinder, MD;  Location: [place];  Service: General;  Laterality: Left;   SHOULDER OPEN ROTATOR CUFF REPAIR Left 1997   TIBIA IM NAIL INSERTION Left 06/21/2013   Procedure: INTRAMEDULLARY (IM) NAIL TIBIAL;  Surgeon: Meredith Pel, MD;  Location: [facility];  Service: Orthopedics;  Laterality: Left;   TONSILLECTOMY  AS CHILD   TOTAL HIP ARTHROPLASTY Right 06-07-2010   TRANSTHORACIC ECHOCARDIOGRAM  09-11-2010   DR MCALHANY   LVSF  55-60%/  MILD MV CALCIFICATION WITH NO STENOSIS/  MILD MR & TR / MODERATE LAE/  MODERATE TO SEVERE RAE   UPPER GI ENDOSCOPY  03-29-2010    Social History  reports that she has never smoked. She  has never used smokeless tobacco. She reports that she does not drink alcohol and does not use drugs.  Allergies  Allergen Reactions   Zosyn [Piperacillin Sod-Tazobactam So] Hives, Itching, Rash and Other (See Comments)    Morbilliform eruptions with itching- looks like Measles   Atorvastatin     myalgias   Zoloft [Sertraline]     Diarrhea   Atenolol Nausea And Vomiting   Clarithromycin Nausea And Vomiting   Codeine Sulfate Nausea Only   Levaquin [Levofloxacin] Nausea Only   Macrodantin Other (See Comments)    UNSPECIFIED    Oxycodone-Acetaminophen Nausea And Vomiting    Family History  Problem Relation Age of Onset   Cancer Father    Hypertension Other   Reviewed on admission  Prior to Admission medications   Medication Sig Start Date End Date Taking? Authorizing Provider  acetaminophen (TYLENOL) 500 MG tablet Take 500 mg by mouth daily as needed for mild pain.     [provider]  apixaban (ELIQUIS) 5 MG TABS tablet Take 1 tablet (5 mg total) by mouth 2 (two) times daily. 11/11/21   Burnell Blanks, MD  B Complex-C (B-COMPLEX WITH VITAMIN C) tablet  Take 1 tablet by mouth daily.    [provider]  busPIRone (BUSPAR) 15 MG tablet TAKE 1 TABLET BY MOUTH THREE TIMES A DAY 09/19/21   [name], [name], MD  CEMIPLIMAB-RWLC IV Inject 1 Dose into the vein every 21 ( twenty-one) days.    [provider]  Cholecalciferol (VITAMIN D3) 1000 UNITS tablet Take 1,000 Units by mouth daily.    [provider]  diltiazem (CARDIZEM CD) 120 MG 24 hr capsule TAKE 1 CAPSULE BY MOUTH ONCE DAILY 02/28/21   [name], [name], MD  doxycycline (VIBRA-TABS) 100 MG tablet TAKE 1 TABLET BY MOUTH ONCE DAILY 12/10/21   [name], [name], MD  famotidine (PEPCID) 20 MG tablet Take 20 mg by mouth daily.    [provider]  furosemide (LASIX) 40 MG tablet TAKE 1 TABLET BY MOUTH TWICE DAILY 11/11/21   [name], [name], MD  gabapentin (NEURONTIN) 100 MG capsule TAKE 1  CAPSULE BY MOUTH EVERY NIGHT AT BEDTIME *DO NOT CRUSH OR CHEW* 11/11/21   [name], [name], MD  hydrocortisone 2.5 % lotion Apply topically 3 (three) times daily. 11/06/20   [name], [name], MD  levothyroxine (SYNTHROID) 25 MCG tablet Take 1 tablet (25 mcg total) by mouth daily before breakfast. 11/14/21   [name], [name], MD  lidocaine-prilocaine (EMLA) cream Apply 1 Application topically as needed. As directed 1 h before procedure 09/17/21   [name], [name], MD  metoprolol succinate (TOPROL-XL) 50 MG 24 hr tablet TAKE 1/2 TABLET = 25 MG BY MOUTH TWICE DAILY. TAKE WITH OR IMMEDIATELY FOLLOWING A MEAL 03/26/21   [name], [name], MD  Multiple Vitamins-Minerals (MULTIVITAMIN WITH MINERALS) tablet Take 1 tablet by mouth daily.    [provider]  Omadacycline Tosylate (NUZYRA) 150 MG TABS Take 450 mg qd x 2 days (samples), then 300 mg daily x 14 days total 12/31/21   [name], [name], MD  polyethylene glycol (MIRALAX) 17 g packet Take 17 g by mouth daily. 10/15/18   Maudie Flakes, MD  potassium chloride SA (KLOR-CON M) 20 MEQ tablet TAKE 1  TABLET BY MOUTH ONCE DAILY  ALONG W/ FUROSEMIDE AS NEED *TAKE WITH FOOD* *DO NOT CRUSH OR CHEW* *MAY DISSOLVE* 02/28/21   [name], [name], MD  temazepam (RESTORIL) 15 MG capsule TAKE  1 CAPSULE BY MOUTH AT BEDTIME AS NEEDED FOR SLEEP 09/15/21   [name], [name], MD    Physical Exam: Vitals:   01/08/22 1500 01/08/22 1551 01/08/22 1700 01/08/22 1715  BP: (!) 116/57  132/65 123/69  Pulse: 89  84 88  Resp: (!) '25  15 18  '$ Temp:  97.7 F (36.5 C)    TempSrc:  Oral    SpO2: 90%  100% 100%    Physical Exam Constitutional:      General: She is not in acute distress.    Appearance: Normal appearance.  HENT:     Head: Normocephalic and atraumatic.     Mouth/Throat:     Mouth: Mucous membranes are moist.     Pharynx: Oropharynx is clear.  Eyes:     Extraocular Movements: Extraocular movements intact.     Pupils: Pupils are equal, round, and reactive to light.  Cardiovascular:     Rate and Rhythm: Normal rate and regular rhythm.     Pulses: Normal pulses.     Heart sounds: Normal heart sounds.  Pulmonary:     Effort: Pulmonary effort is normal. No respiratory distress.     Breath sounds: Normal breath sounds.  Abdominal:     General: Bowel sounds are normal. There is no distension.     Palpations: Abdomen is soft.     Tenderness: There is no abdominal tenderness.  Musculoskeletal:        General: No swelling or deformity.  Skin:    General: Skin is warm and dry.  Neurological:     General: No focal deficit present.     Mental Status: Mental status is at baseline.  Psychiatric:     Comments: Tearful about condition and concern that family is "doing this to her"     Labs on Admission: I have personally reviewed following labs and imaging studies  CBC: Recent Labs  Lab 01/08/22 1255  WBC 7.1  HGB 10.8*  HCT 29.3*  MCV 108.1*  PLT 315    Basic Metabolic Panel: Recent Labs  Lab 01/08/22 1255  NA 136  K 4.2  CL 98  CO2 27  GLUCOSE 110*  BUN 21  CREATININE  1.18*  CALCIUM 8.8*  MG 1.8  PHOS 3.2    GFR: Estimated Creatinine Clearance: 26.2 mL/min (A) (by C-G formula based on SCr of 1.18 mg/dL (H)).  Liver Function Tests: Recent Labs  Lab 01/08/22 1255  AST 33  ALT 17  ALKPHOS 61  BILITOT 1.0  PROT 6.4*  ALBUMIN 2.4*    Urine analysis:    Component Value Date/Time   COLORURINE YELLOW 01/08/2022 1600   APPEARANCEUR HAZY (A) 01/08/2022 1600   LABSPEC 1.004 (L) 01/08/2022 1600   PHURINE 7.0 01/08/2022 1600   GLUCOSEU NEGATIVE 01/08/2022 1600   GLUCOSEU NEGATIVE 01/17/2015 1415   HGBUR NEGATIVE 01/08/2022 1600   BILIRUBINUR NEGATIVE 01/08/2022 1600   KETONESUR NEGATIVE 01/08/2022 1600   PROTEINUR NEGATIVE 01/08/2022 1600   UROBILINOGEN 0.2 01/17/2015 1415   NITRITE NEGATIVE 01/08/2022 1600   LEUKOCYTESUR LARGE (A) 01/08/2022 1600    Radiological Exams on Admission: DG Chest Port 1 View  Result Date: 01/08/2022 CLINICAL DATA:  Upper respiratory tract symptoms.  Weakness. EXAM: PORTABLE CHEST 1 VIEW COMPARISON:  10/07/2018 FINDINGS: Mild cardiac enlargement. Aortic atherosclerotic calcifications. No pleural effusion or edema. No airspace disease. The visualized osseous structures are unremarkable. IMPRESSION: Mild cardiac enlargement.  No acute findings. Electronically Signed   By: Queen Slough.D.  On: 01/08/2022 12:44    EKG: Independently reviewed.  Atrial fibrillation at 88 bpm.  Right bundle blanch block.  PVC.  Assessment/Plan Principal Problem:   Generalized weakness Active Problems:   Hypothyroidism   Iron deficiency anemia   Essential hypertension   Atrial flutter (HCC)   Peptic ulcer   Memory loss   Skin cancer   CKD (chronic kidney disease) stage 3, GFR 30-59 ml/min (HCC)   Generalized weakness UTI > Patient presenting with 3 days of worsening weakness/fatigue.  Also with decreased p.o. intake. > Reportedly has been living fairly independently at independent living facility but with his fatigue she  has had trouble even getting up out of bed.  She only has home health 3 times a week. > Lab workup largely reassuring in the ED.  Did have evidence of possible UTI with leukocytes and bacteria in her urine with no other explanation she has been started on ceftriaxone. > Of note, she has been started on a tetracycline week ago for concern for cellulitis of the lower extremities which at this time appears to be stable/improving from outpatient visit (though less likely, if urine culture is negative, this medication could cause fatigue). - Monitor on MedSurg unit overnight - Continue with ceftriaxone - Follow-up urine culture and blood cultures - Trend fever curve and WBC - PT/OT eval and treat  Venous stasis Venous stasis ulcers Recent cellulitis > Known history of venous stasis and subsequent ulceration that she follows with wound care for.  Concern for recent cellulitis and started on a Omadacycline outpatient.  This appears to be stable/improving.  No leukocytosis. - Holding outpatient antibiotic, as we do not have in the hospital and she did not bring her supply with her - Will be on ceftriaxone and this has similar coverage to outpatient antibiotic - Trend fever curve and WBC - PT/OT eval and treat - WOC consulted in the ED  PUD - Continue home Pepcid  Anemia > Hemoglobin stable at 10.8 - Trend CBC  Hypothyroidism - Continue home Synthroid  Hypertension - Continue home diltiazem and metoprolol  CKD 3 > Creatinine currently stable at 1.18 - Did receive some IV fluids in the ED for possible dehydration given decreased p.o. intake for the last few days. - Trend function and electrolytes  Atrial fibrillation - Continue home diltiazem, metoprolol, Eliquis  Diastolic CHF > Chronic history of this on Lasix twice daily.  Last echo was in 2019 with EF 55%, indeterminate diastolic function. - Holding Lasix in the setting of decreased p.o. intake/mild dehydration - Continue home  metoprolol  DVT prophylaxis: Eliquis Code Status:   DNR, DNI would like all other interventions at this time. Family Communication:  None on admission. Disposition Plan:   Patient is from:  Independent living  Anticipated DC to:  Pending clinical course  Anticipated DC date:  1 to 3 days  Anticipated DC barriers: Generalized weakness, ability to care for self  Consults called:  None Admission status:  Observation, MedSurg  Severity of Illness: The appropriate patient status for this patient is OBSERVATION. Observation status is judged to be reasonable and necessary in order to provide the required intensity of service to ensure the patient's safety. The patient's presenting symptoms, physical exam findings, and initial radiographic and laboratory data in the context of their medical condition is felt to place them at decreased risk for further clinical deterioration. Furthermore, it is anticipated that the patient will be medically stable for discharge from the hospital within 2  midnights of admission.    Marcelyn Bruins MD [facility]  How to contact the [facility] Attending or Consulting provider [place] or covering provider during after hours [place], for this patient?   Check the care team in [facility] and look for a) attending/consulting [facility] provider listed and b) the [facility] team listed Log into [email or web address] and use [place]'s universal password to access. If you do not have the password, please contact the hospital operator. Locate the [facility] provider you are looking for under [facility] and page to a number that you can be directly reached. If you still have difficulty reaching the provider, please page the [facility] (Director on Call) for the Hospitalists listed on amion for assistance.  01/08/2022, 6:14 PM

## 2022-01-08 NOTE — Telephone Encounter (Signed)
She states patient has been sent to ER this am. She states she haven't really been out the bed since Saturday. She is very weak, confuse, ? If dehydrated. When pt get d/c she will have to go to [facility], and she will need FL2 form fill-out for that floor. Faxing over form with return fax #. Inform Mrs. White will look out for form.Marland KitchenJohny Chess

## 2022-01-08 NOTE — Telephone Encounter (Signed)
Noted.  I am sorry.  I will work on it. Yes, for now go back on doxycycline. Thank you

## 2022-01-08 NOTE — ED Triage Notes (Signed)
Pt BIB by [facility] from [facility]. Pt was reportledly been sick since Saturday. According to staff she has declined since her daughter left last week. Pt is not in any pain right now, she just " doesn't feel right". VS are stable for pt, main concern of staff is pt legs, she has had skin cancer originating on her legs and and fluid will come out of her legs from time to time. 12 lead unremarkable. Pt has not had a consistent medication compliance.   Pt has an 18g in LAC   LVS  119/71 HR 90  CGB 198  T 98.0

## 2022-01-08 NOTE — ED Provider Notes (Signed)
Care of patient assumed from the lossing 4 PM.  This patient presented to the ED due to generalized weakness that has been present over the past several days.  Workup thus far is unremarkable.  Urinalysis is pending acquisition of a sample.  If no clinical indication for admission, she will need TOC consult. Physical Exam  BP (!) 116/57   Pulse 89   Temp 97.7 F (36.5 C) (Oral)   Resp (!) 25   SpO2 90%   Physical Exam Vitals and nursing note reviewed.  Constitutional:      General: She is not in acute distress.    Appearance: Normal appearance. She is well-developed. She is not ill-appearing, toxic-appearing or diaphoretic.  HENT:     Head: Normocephalic and atraumatic.     Right Ear: External ear normal.     Left Ear: External ear normal.     Nose: Nose normal.     Mouth/Throat:     Mouth: Mucous membranes are moist.  Eyes:     Extraocular Movements: Extraocular movements intact.     Conjunctiva/sclera: Conjunctivae normal.  Cardiovascular:     Rate and Rhythm: Normal rate and regular rhythm.     Heart sounds: No murmur heard. Pulmonary:     Effort: Pulmonary effort is normal. No respiratory distress.     Breath sounds: Normal breath sounds. No wheezing or rales.  Abdominal:     General: There is no distension.     Palpations: Abdomen is soft.     Tenderness: There is no abdominal tenderness.  Musculoskeletal:        General: No swelling. Normal range of motion.     Cervical back: Normal range of motion and neck supple.     Right lower leg: No edema.     Left lower leg: No edema.  Skin:    General: Skin is warm and dry.     Capillary Refill: Capillary refill takes less than 2 seconds.     Coloration: Skin is not jaundiced or pale.     Comments: Erythema of distal lower extremities bilaterally, below the knees.  Neurological:     General: No focal deficit present.     Mental Status: She is alert.     Cranial Nerves: No cranial nerve deficit.     Sensory: No sensory  deficit.     Motor: No weakness.     Coordination: Coordination normal.  Psychiatric:        Mood and Affect: Mood is anxious.        Behavior: Behavior normal. Behavior is cooperative.        Thought Content: Thought content normal.     Procedures  Procedures  ED Course / MDM    Medical Decision Making Amount and/or Complexity of Data Reviewed Labs: ordered. Radiology: ordered.  Risk Decision regarding hospitalization.   On assessment, patient is awake, alert, and resting on ED stretcher.  She is quite anxious.  She states that she resides in an independent living facility.  She is typically able to perform all her ADLs.  Over the past 3 days, she has been unable to leave the bed.  She does state that she has been able to walk short distances in order to use the bathroom.  She endorses generalized weakness.  She denies any areas of discomfort.  Per chart review, patient was started on tetracycline antibiotic therapy for cellulitis of her legs.  Patient states that the appearance of her legs is consistent with  her baseline.  On urinalysis, there is evidence of UTI.  Urine cultures were sent and ceftriaxone was ordered for treatment of UTI.  Decision for escalation of antibiotic therapy for her legs was deferred to hospitalist.  Patient was admitted to hospitalist for further management.       Godfrey Pick, MD 01/08/22 2206

## 2022-01-08 NOTE — Telephone Encounter (Signed)
Pls ask Pam to remind me to re-do [facility] after Jan 1st, 2024. It should go thru insurance then,  I'm told. Thx

## 2022-01-08 NOTE — Telephone Encounter (Signed)
Place FL2 form on MD desk to complete./lmb

## 2022-01-08 NOTE — ED Notes (Signed)
ED TO INPATIENT HANDOFF REPORT  ED Nurse Name and Phone #: Bethel Gaglio [phone]  S Name/Age/Gender [name] 88 y.o. female Room/Bed: H021C/H021C  Code Status   Code Status: Full Code  Home/SNF/Other Skilled nursing facility Patient oriented to: self Is this baseline? No   Triage Complete: Triage complete  Chief Complaint Generalized weakness [R53.1]  Triage Note Pt BIB by [facility] from [facility]. Pt was reportledly been sick since Saturday. According to staff she has declined since her daughter left last week. Pt is not in any pain right now, she just " doesn't feel right". VS are stable for pt, main concern of staff is pt legs, she has had skin cancer originating on her legs and and fluid will come out of her legs from time to time. 12 lead unremarkable. Pt has not had a consistent medication compliance.   Pt has an 18g in Man  119/71 HR 90  CGB 198  T 98.0    Allergies Allergies  Allergen Reactions   Zosyn [Piperacillin Sod-Tazobactam So] Hives, Itching, Rash and Other (See Comments)    Morbilliform eruptions with itching- looks like Measles   Atorvastatin     myalgias   Zoloft [Sertraline]     Diarrhea   Atenolol Nausea And Vomiting   Clarithromycin Nausea And Vomiting   Codeine Sulfate Nausea Only   Levaquin [Levofloxacin] Nausea Only   Macrodantin Other (See Comments)    UNSPECIFIED    Oxycodone-Acetaminophen Nausea And Vomiting    Level of Care/Admitting Diagnosis ED Disposition     ED Disposition  Admit   Condition  --   Comment  Hospital Area: [facility] [[id number]]  Level of Care: Med-Surg [16]  May place patient in observation at [facility] or [place] if equivalent level of care is available:: No  Covid Evaluation: Confirmed COVID Negative  Diagnosis: Generalized weakness [478295]  Admitting Physician: Marcelyn Bruins [[id number]]  Attending Physician: Marcelyn Bruins [id number]           B Medical/Surgery History Past Medical History:  Diagnosis Date   Anxiety    denies   Atrial flutter ([place])    Bilateral edema of lower extremity    Breast cancer ([place])    left breast   Chronic constipation    Chronic diastolic CHF (congestive heart failure) ([place])    Community acquired pneumonia of left lung    5/19 inpatient Rx   Diverticulosis of colon    GERD (gastroesophageal reflux disease)    H/O hiatal hernia    History of cellulitis    LEFT LOWER LEG --  SEPT 2015   History of colitis    DX  2003  WITH ISCHEMIC COLITIS   History of diverticulitis of colon    perferated diverticulitis w/ abscess 08-25-2013 drain placed --  resolved without surgical intervention   History of GI bleed    2011--- UPPER SECONARY GASTRIC ULCER FROM NSAID USE   History of Helicobacter pylori infection    2011   History of ulcer of lower limb    2012   RIGHT LOWER EXTREMITIY--  STATSIS ULCER   Hyperlipidemia    Hypertension    Hypothyroidism    Iron deficiency anemia 2011   elevated MCV   LBP (low back pain)    severe lumbar spondylosis s/p L3/L4,L4/L5 fusion 12/10   Lumbar spondylolysis    OA (osteoarthritis)    "hands" (08/25/2013)   Open wound of left lower  leg    Osteopenia    Persistent atrial fibrillation (HCC)    CARDIOLOGIST-   DR MCALANY   RBBB    Urinary frequency    Past Surgical History:  Procedure Laterality Date   APPLICATION OF A-CELL OF EXTREMITY Left 06/21/2013   Procedure: APPLICATION OF A-CELL OF EXTREMITY;  Surgeon: Theodoro Kos, DO;  Location: [facility];  Service: Plastics;  Laterality: Left;   APPLICATION OF A-CELL OF EXTREMITY Left 08/22/2013   Procedure: APPLICATION OF A-CELL/VAC TO LOWER LEFT LEG WOUND;  Surgeon: Theodoro Kos, DO;  Location: [place];  Service: Plastics;  Laterality: Left;   APPLICATION OF A-CELL OF EXTREMITY Left 02/06/2014   Procedure: WITH PLACEMENT OF A -CELL ;  Surgeon: Theodoro Kos, DO;  Location: [place];  Service: Plastics;  Laterality: Left;   APPLICATION OF A-CELL OF EXTREMITY Left 08/09/2014   Procedure: PLACEMENT OF A CELL;  Surgeon: Theodoro Kos, DO;  Location: [place];  Service: Plastics;  Laterality: Left;   APPLICATION OF WOUND VAC Left 06/16/2013   Procedure: APPLICATION OF WOUND VAC;  Surgeon: Meredith Pel, MD;  Location: [place];  Service: Orthopedics;  Laterality: Left;   APPLICATION OF WOUND VAC Left 06/21/2013   Procedure: APPLICATION OF WOUND VAC;  Surgeon: Theodoro Kos, DO;  Location: [place];  Service: Plastics;  Laterality: Left;   BIOPSY BREAST  07/25/2015   LEFT RADIOACTIVE SEED GUIDED EXCISIONAL BREAST BIOPSY (Left) as a surgical intervention   EXTERNAL FIXATION LEG Left 06/16/2013   Procedure: EXTERNAL FIXATION LEG;  Surgeon: Meredith Pel, MD;  Location: [place];  Service: Orthopedics;  Laterality: Left;   EXTERNAL FIXATION REMOVAL Left 06/21/2013   Procedure: REMOVAL EXTERNAL FIXATION LEG;  Surgeon: Meredith Pel, MD;  Location: [facility];  Service: Orthopedics;  Laterality: Left;   EYE SURGERY Bilateral    cataract removal   HARDWARE REMOVAL Left 05/23/2014   Procedure: HARDWARE REMOVAL;  Surgeon: Meredith Pel, MD;  Location: [facility];  Service: Orthopedics;  Laterality: Left;  REMOVAL OF HARDWARE LEFT TIBIA   I & D EXTREMITY Left 06/16/2013   Procedure: IRRIGATION AND DEBRIDEMENT EXTREMITY;  Surgeon: Meredith Pel, MD;  Location: [place];  Service: Orthopedics;  Laterality: Left;   I & D EXTREMITY Left 02/06/2014   Procedure: IRRIGATION AND DEBRIDEMENT LEFT LEG WOUND ;  Surgeon: Theodoro Kos, DO;  Location: [place];  Service: Plastics;  Laterality: Left;   I & D EXTREMITY Left 08/09/2014   Procedure: IRRIGATION AND DEBRIDEMENT  OF LEFT LOWER LEG;  Surgeon: Theodoro Kos, DO;  Location: [place];  Service: Plastics;  Laterality: Left;   LUMBAR FUSION  01-02-2009   L3-L5   RADIOACTIVE SEED GUIDED EXCISIONAL BREAST BIOPSY Left 07/25/2015    Procedure: LEFT RADIOACTIVE SEED GUIDED EXCISIONAL BREAST BIOPSY;  Surgeon: Rolm Bookbinder, MD;  Location: [place];  Service: General;  Laterality: Left;   RE-EXCISION OF BREAST LUMPECTOMY Left 09/05/2015   Procedure: RE-EXCISION OF LEFT BREAST LUMPECTOMY;  Surgeon: Rolm Bookbinder, MD;  Location: [facility];  Service: General;  Laterality: Left;   SHOULDER OPEN ROTATOR CUFF REPAIR Left 1997   TIBIA IM NAIL INSERTION Left 06/21/2013   Procedure: INTRAMEDULLARY (IM) NAIL TIBIAL;  Surgeon: Meredith Pel, MD;  Location: [facility];  Service: Orthopedics;  Laterality: Left;   TONSILLECTOMY  AS CHILD   TOTAL HIP ARTHROPLASTY Right 06-07-2010   TRANSTHORACIC ECHOCARDIOGRAM  09-11-2010   DR MCALHANY   LVSF  55-60%/  MILD MV CALCIFICATION WITH NO STENOSIS/  MILD MR & TR / MODERATE LAE/  MODERATE TO SEVERE RAE   UPPER GI ENDOSCOPY  03-29-2010     A IV Location/Drains/Wounds Patient Lines/Drains/Airways Status     Active Line/Drains/Airways     Name Placement date Placement time Site Days   Peripheral IV 01/08/22 Left Antecubital 01/08/22  --  Antecubital  less than 1   Peripheral IV 01/08/22 20 G Anterior;Proximal;Right Forearm 01/08/22  1808  Forearm  less than 1   Incision (Closed) 06/17/13 Leg 06/17/13  0003  -- 3127   Incision (Closed) 06/21/13 Leg Left 06/21/13  1551  -- 3123            Intake/Output Last 24 hours  Intake/Output Summary (Last 24 hours) at 01/08/2022 1920 Last data filed at 01/08/2022 1903 Gross per 24 hour  Intake 600 ml  Output --  Net 600 ml    Labs/Imaging Results for orders placed or performed during the hospital encounter of 01/08/22 (from the past 48 hour(s))  Resp panel by RT-PCR (RSV, Flu A&B, Covid) Anterior Nasal Swab     Status: None   Collection Time: 01/08/22 12:16 PM   Specimen: Anterior Nasal Swab  Result Value Ref Range   SARS Coronavirus 2 by RT PCR NEGATIVE NEGATIVE    Comment: (NOTE) SARS-CoV-2 target nucleic acids are NOT DETECTED.  The  SARS-CoV-2 RNA is generally detectable in upper respiratory specimens during the acute phase of infection. The lowest concentration of SARS-CoV-2 viral copies this assay can detect is 138 copies/mL. A negative result does not preclude SARS-Cov-2 infection and should not be used as the sole basis for treatment or other patient management decisions. A negative result may occur with  improper specimen collection/handling, submission of specimen other than nasopharyngeal swab, presence of viral mutation(s) within the areas targeted by this assay, and inadequate number of viral copies(<138 copies/mL). A negative result must be combined with clinical observations, patient history, and epidemiological information. The expected result is Negative.  Fact Sheet for Patients:  EntrepreneurPulse.com.au  Fact Sheet for Healthcare Providers:  IncredibleEmployment.be  This test is no t yet approved or cleared by the [place] FDA and  has been authorized for detection and/or diagnosis of SARS-CoV-2 by FDA under an Emergency Use Authorization (EUA). This EUA will remain  in effect (meaning this test can be used) for the duration of the COVID-19 declaration under Section 564(b)(1) of the Act, 21 U.S.C.section 360bbb-3(b)(1), unless the authorization is terminated  or revoked sooner.       Influenza A by PCR NEGATIVE NEGATIVE   Influenza B by PCR NEGATIVE NEGATIVE    Comment: (NOTE) The Xpert Xpress SARS-CoV-2/FLU/RSV plus assay is intended as an aid in the diagnosis of influenza from Nasopharyngeal swab specimens and should not be used as a sole basis for treatment. Nasal washings and aspirates are unacceptable for Xpert Xpress SARS-CoV-2/FLU/RSV testing.  Fact Sheet for Patients: EntrepreneurPulse.com.au  Fact Sheet for Healthcare Providers: IncredibleEmployment.be  This test is not yet approved or cleared by the  [place] FDA and has been authorized for detection and/or diagnosis of SARS-CoV-2 by FDA under an Emergency Use Authorization (EUA). This EUA will remain in effect (meaning this test can be used) for the duration of the COVID-19 declaration under Section 564(b)(1) of the Act, 21 U.S.C. section 360bbb-3(b)(1), unless the authorization is terminated or revoked.     Resp Syncytial Virus by PCR NEGATIVE NEGATIVE    Comment: (NOTE)  Fact Sheet for Patients: EntrepreneurPulse.com.au  Fact Sheet for Healthcare Providers: IncredibleEmployment.be  This test is not yet approved or cleared by the [place] FDA and has been authorized for detection and/or diagnosis of SARS-CoV-2 by FDA under an Emergency Use Authorization (EUA). This EUA will remain in effect (meaning this test can be used) for the duration of the COVID-19 declaration under Section 564(b)(1) of the Act, 21 U.S.C. section 360bbb-3(b)(1), unless the authorization is terminated or revoked.  Performed at [facility], [place] [address] [place], [place] [address]   Comprehensive metabolic panel     Status: Abnormal   Collection Time: 01/08/22 12:55 PM  Result Value Ref Range   Sodium 136 135 - 145 mmol/L   Potassium 4.2 3.5 - 5.1 mmol/L   Chloride 98 98 - 111 mmol/L   CO2 27 22 - 32 mmol/L   Glucose, Bld 110 (H) 70 - 99 mg/dL    Comment: Glucose reference range applies only to samples taken after fasting for at least 8 hours.   BUN 21 8 - 23 mg/dL   Creatinine, Ser 1.18 (H) 0.44 - 1.00 mg/dL   Calcium 8.8 (L) 8.9 - 10.3 mg/dL   Total Protein 6.4 (L) 6.5 - 8.1 g/dL   Albumin 2.4 (L) 3.5 - 5.0 g/dL   AST 33 15 - 41 U/L   ALT 17 0 - 44 U/L   Alkaline Phosphatase 61 38 - 126 U/L   Total Bilirubin 1.0 0.3 - 1.2 mg/dL   GFR, Estimated 43 (L) >60 mL/min    Comment: (NOTE) Calculated using the CKD-EPI Creatinine Equation (2021)    Anion gap 11 5 - 15    Comment: Performed at  [facility], [place] [address], [place], [place] [address]  CBC     Status: Abnormal   Collection Time: 01/08/22 12:55 PM  Result Value Ref Range   WBC 7.1 4.0 - 10.5 K/uL   RBC 2.71 (L) 3.87 - 5.11 MIL/uL   Hemoglobin 10.8 (L) 12.0 - 15.0 g/dL   HCT 29.3 (L) 36.0 - 46.0 %   MCV 108.1 (H) 80.0 - 100.0 fL   MCH 39.9 (H) 26.0 - 34.0 pg   MCHC 36.9 (H) 30.0 - 36.0 g/dL   RDW 14.7 11.5 - 15.5 %   Platelets 322 150 - 400 K/uL   nRBC 0.0 0.0 - 0.2 %    Comment: Performed at [facility], [place] [address], [place], [place] [address]  Brain natriuretic peptide     Status: Abnormal   Collection Time: 01/08/22 12:55 PM  Result Value Ref Range   B Natriuretic Peptide 504.0 (H) 0.0 - 100.0 pg/mL    Comment: Performed at [facility] [address] [place], [place] [address]  Troponin I (High Sensitivity)     Status: Abnormal   Collection Time: 01/08/22 12:55 PM  Result Value Ref Range   Troponin I (High Sensitivity) 22 (H) <18 ng/L    Comment: (NOTE) Elevated high sensitivity troponin I (hsTnI) values and significant  changes across serial measurements may suggest ACS but many other  chronic and acute conditions are known to elevate hsTnI results.  Refer to the "Links" section for chest pain algorithms and additional  guidance. Performed at [facility], [place] [address] [place], [place] [address]   TSH     Status: None   Collection Time: 01/08/22 12:55 PM  Result Value Ref Range   TSH 0.885 0.350 -  4.500 uIU/mL    Comment: Performed by a 3rd Generation assay with a functional sensitivity of <=0.01 uIU/mL. Performed at [facility], [place] [address] [place], [place] [address]   Phosphorus     Status: None   Collection Time: 01/08/22 12:55 PM  Result Value Ref Range   Phosphorus 3.2 2.5 - 4.6 mg/dL    Comment: Performed at [facility] [address], [place], [place] [address]  Magnesium     Status: None   Collection Time: 01/08/22 12:55 PM  Result  Value Ref Range   Magnesium 1.8 1.7 - 2.4 mg/dL    Comment: Performed at [facility], [place] [address] [place], [place] [address]  Urinalysis, Routine w reflex microscopic Urine, Clean Catch     Status: Abnormal   Collection Time: 01/08/22  4:00 PM  Result Value Ref Range   Color, Urine YELLOW YELLOW   APPearance HAZY (A) CLEAR   Specific Gravity, Urine 1.004 (L) 1.005 - 1.030   pH 7.0 5.0 - 8.0   Glucose, UA NEGATIVE NEGATIVE mg/dL   Hgb urine dipstick NEGATIVE NEGATIVE   Bilirubin Urine NEGATIVE NEGATIVE   Ketones, ur NEGATIVE NEGATIVE mg/dL   Protein, ur NEGATIVE NEGATIVE mg/dL   Nitrite NEGATIVE NEGATIVE   Leukocytes,Ua LARGE (A) NEGATIVE   RBC / HPF 0-5 0 - 5 RBC/hpf   WBC, UA >50 (H) 0 - 5 WBC/hpf   Bacteria, UA RARE (A) NONE SEEN   Squamous Epithelial / LPF 0-5 0 - 5    Comment: Performed at [facility], [place] [address], [place], [place] [address]  Troponin I (High Sensitivity)     Status: None   Collection Time: 01/08/22  4:04 PM  Result Value Ref Range   Troponin I (High Sensitivity) 16 <18 ng/L    Comment: (NOTE) Elevated high sensitivity troponin I (hsTnI) values and significant  changes across serial measurements may suggest ACS but many other  chronic and acute conditions are known to elevate hsTnI results.  Refer to the "Links" section for chest pain algorithms and additional  guidance. Performed at [facility], [place] [address], [name], [place] [address]    *Note: Due to a large number of results and/or encounters for the requested time period, some results have not been displayed. A complete set of results can be found in Results Review.   DG Chest Port 1 View  Result Date: 01/08/2022 CLINICAL DATA:  Upper respiratory tract symptoms.  Weakness. EXAM: PORTABLE CHEST 1 VIEW COMPARISON:  10/07/2018 FINDINGS: Mild cardiac enlargement. Aortic atherosclerotic calcifications. No pleural effusion or edema. No airspace disease. The visualized osseous  structures are unremarkable. IMPRESSION: Mild cardiac enlargement.  No acute findings. Electronically Signed   By: Kerby Moors M.D.   On: 01/08/2022 12:44    Pending Labs Unresulted Labs (From admission, onward)     Start     Ordered   01/09/22 0500  Comprehensive metabolic panel  Tomorrow morning,   R        01/08/22 1814   01/09/22 0500  CBC  Tomorrow morning,   R        01/08/22 1814   01/08/22 1739  Blood culture (routine x 2)  BLOOD CULTURE X 2,   R      01/08/22 1738   01/08/22 1736  Urine Culture  Once,   URGENT       Question:  Indication  Answer:  Dysuria  01/08/22 1735            Vitals/Pain Today's Vitals   01/08/22 1500 01/08/22 1551 01/08/22 1700 01/08/22 1715  BP: (!) 116/57  132/65 123/69  Pulse: 89  84 88  Resp: (!) '25  15 18  '$ Temp:  97.7 F (36.5 C)    TempSrc:  Oral    SpO2: 90%  100% 100%  PainSc:        Isolation Precautions No active isolations  Medications Medications  levothyroxine (SYNTHROID) tablet 25 mcg (has no administration in time range)  famotidine (PEPCID) tablet 10 mg (has no administration in time range)  apixaban (ELIQUIS) tablet 5 mg (has no administration in time range)  diltiazem (CARDIZEM CD) 24 hr capsule 120 mg (has no administration in time range)  metoprolol succinate (TOPROL-XL) 24 hr tablet 25 mg (has no administration in time range)  sodium chloride flush (NS) 0.9 % injection 3 mL (has no administration in time range)  acetaminophen (TYLENOL) tablet 650 mg (has no administration in time range)    Or  acetaminophen (TYLENOL) suppository 650 mg (has no administration in time range)  polyethylene glycol (MIRALAX / GLYCOLAX) packet 17 g (has no administration in time range)  cefTRIAXone (ROCEPHIN) 2 g in sodium chloride 0.9 % 100 mL IVPB (has no administration in time range)  lactated ringers bolus 500 mL (0 mLs Intravenous Stopped 01/08/22 1719)  cefTRIAXone (ROCEPHIN) 2 g in sodium chloride 0.9 % 100 mL IVPB (0 g  Intravenous Stopped 01/08/22 1903)  lactated ringers bolus 1,000 mL (1,000 mLs Intravenous New Bag/Given 01/08/22 1808)    Mobility walks with device Low fall risk    R Recommendations: See Admitting Provider Note  Report given to:   Additional Notes: \

## 2022-01-09 DIAGNOSIS — E860 Dehydration: Secondary | ICD-10-CM | POA: Diagnosis present

## 2022-01-09 DIAGNOSIS — I7389 Other specified peripheral vascular diseases: Secondary | ICD-10-CM | POA: Diagnosis not present

## 2022-01-09 DIAGNOSIS — Z66 Do not resuscitate: Secondary | ICD-10-CM | POA: Diagnosis present

## 2022-01-09 DIAGNOSIS — L03115 Cellulitis of right lower limb: Secondary | ICD-10-CM | POA: Diagnosis not present

## 2022-01-09 DIAGNOSIS — I5032 Chronic diastolic (congestive) heart failure: Secondary | ICD-10-CM | POA: Diagnosis present

## 2022-01-09 DIAGNOSIS — I4819 Other persistent atrial fibrillation: Secondary | ICD-10-CM | POA: Diagnosis present

## 2022-01-09 DIAGNOSIS — R278 Other lack of coordination: Secondary | ICD-10-CM | POA: Diagnosis not present

## 2022-01-09 DIAGNOSIS — Z881 Allergy status to other antibiotic agents status: Secondary | ICD-10-CM | POA: Diagnosis not present

## 2022-01-09 DIAGNOSIS — I5030 Unspecified diastolic (congestive) heart failure: Secondary | ICD-10-CM | POA: Diagnosis not present

## 2022-01-09 DIAGNOSIS — I4892 Unspecified atrial flutter: Secondary | ICD-10-CM | POA: Diagnosis present

## 2022-01-09 DIAGNOSIS — I2510 Atherosclerotic heart disease of native coronary artery without angina pectoris: Secondary | ICD-10-CM | POA: Diagnosis not present

## 2022-01-09 DIAGNOSIS — M6281 Muscle weakness (generalized): Secondary | ICD-10-CM | POA: Diagnosis not present

## 2022-01-09 DIAGNOSIS — D649 Anemia, unspecified: Secondary | ICD-10-CM | POA: Diagnosis not present

## 2022-01-09 DIAGNOSIS — G928 Other toxic encephalopathy: Secondary | ICD-10-CM | POA: Diagnosis present

## 2022-01-09 DIAGNOSIS — I878 Other specified disorders of veins: Secondary | ICD-10-CM | POA: Diagnosis present

## 2022-01-09 DIAGNOSIS — K279 Peptic ulcer, site unspecified, unspecified as acute or chronic, without hemorrhage or perforation: Secondary | ICD-10-CM | POA: Diagnosis present

## 2022-01-09 DIAGNOSIS — E039 Hypothyroidism, unspecified: Secondary | ICD-10-CM | POA: Diagnosis present

## 2022-01-09 DIAGNOSIS — I83019 Varicose veins of right lower extremity with ulcer of unspecified site: Secondary | ICD-10-CM | POA: Diagnosis present

## 2022-01-09 DIAGNOSIS — N179 Acute kidney failure, unspecified: Secondary | ICD-10-CM | POA: Diagnosis present

## 2022-01-09 DIAGNOSIS — Z7401 Bed confinement status: Secondary | ICD-10-CM | POA: Diagnosis not present

## 2022-01-09 DIAGNOSIS — I130 Hypertensive heart and chronic kidney disease with heart failure and stage 1 through stage 4 chronic kidney disease, or unspecified chronic kidney disease: Secondary | ICD-10-CM | POA: Diagnosis present

## 2022-01-09 DIAGNOSIS — R627 Adult failure to thrive: Secondary | ICD-10-CM | POA: Diagnosis present

## 2022-01-09 DIAGNOSIS — L03116 Cellulitis of left lower limb: Secondary | ICD-10-CM | POA: Diagnosis not present

## 2022-01-09 DIAGNOSIS — D509 Iron deficiency anemia, unspecified: Secondary | ICD-10-CM | POA: Diagnosis present

## 2022-01-09 DIAGNOSIS — R2689 Other abnormalities of gait and mobility: Secondary | ICD-10-CM | POA: Diagnosis not present

## 2022-01-09 DIAGNOSIS — R2681 Unsteadiness on feet: Secondary | ICD-10-CM | POA: Diagnosis not present

## 2022-01-09 DIAGNOSIS — F419 Anxiety disorder, unspecified: Secondary | ICD-10-CM | POA: Diagnosis not present

## 2022-01-09 DIAGNOSIS — Z853 Personal history of malignant neoplasm of breast: Secondary | ICD-10-CM | POA: Diagnosis not present

## 2022-01-09 DIAGNOSIS — Z888 Allergy status to other drugs, medicaments and biological substances status: Secondary | ICD-10-CM | POA: Diagnosis not present

## 2022-01-09 DIAGNOSIS — Z1152 Encounter for screening for COVID-19: Secondary | ICD-10-CM | POA: Diagnosis not present

## 2022-01-09 DIAGNOSIS — Z85828 Personal history of other malignant neoplasm of skin: Secondary | ICD-10-CM | POA: Diagnosis not present

## 2022-01-09 DIAGNOSIS — E785 Hyperlipidemia, unspecified: Secondary | ICD-10-CM | POA: Diagnosis present

## 2022-01-09 DIAGNOSIS — R319 Hematuria, unspecified: Secondary | ICD-10-CM | POA: Diagnosis not present

## 2022-01-09 DIAGNOSIS — Z885 Allergy status to narcotic agent status: Secondary | ICD-10-CM | POA: Diagnosis not present

## 2022-01-09 DIAGNOSIS — B964 Proteus (mirabilis) (morganii) as the cause of diseases classified elsewhere: Secondary | ICD-10-CM | POA: Diagnosis present

## 2022-01-09 DIAGNOSIS — N390 Urinary tract infection, site not specified: Secondary | ICD-10-CM | POA: Diagnosis present

## 2022-01-09 DIAGNOSIS — R531 Weakness: Secondary | ICD-10-CM | POA: Diagnosis not present

## 2022-01-09 DIAGNOSIS — N183 Chronic kidney disease, stage 3 unspecified: Secondary | ICD-10-CM | POA: Diagnosis present

## 2022-01-09 DIAGNOSIS — I10 Essential (primary) hypertension: Secondary | ICD-10-CM | POA: Diagnosis not present

## 2022-01-09 DIAGNOSIS — K219 Gastro-esophageal reflux disease without esophagitis: Secondary | ICD-10-CM | POA: Diagnosis not present

## 2022-01-09 DIAGNOSIS — R41841 Cognitive communication deficit: Secondary | ICD-10-CM | POA: Diagnosis not present

## 2022-01-09 DIAGNOSIS — I872 Venous insufficiency (chronic) (peripheral): Secondary | ICD-10-CM | POA: Diagnosis not present

## 2022-01-09 DIAGNOSIS — L97919 Non-pressure chronic ulcer of unspecified part of right lower leg with unspecified severity: Secondary | ICD-10-CM | POA: Diagnosis present

## 2022-01-09 DIAGNOSIS — G9340 Encephalopathy, unspecified: Secondary | ICD-10-CM | POA: Diagnosis not present

## 2022-01-09 LAB — COMPREHENSIVE METABOLIC PANEL
ALT: 17 U/L (ref 0–44)
AST: 32 U/L (ref 15–41)
Albumin: 2.3 g/dL — ABNORMAL LOW (ref 3.5–5.0)
Alkaline Phosphatase: 62 U/L (ref 38–126)
Anion gap: 8 (ref 5–15)
BUN: 13 mg/dL (ref 8–23)
CO2: 27 mmol/L (ref 22–32)
Calcium: 8.6 mg/dL — ABNORMAL LOW (ref 8.9–10.3)
Chloride: 100 mmol/L (ref 98–111)
Creatinine, Ser: 1.11 mg/dL — ABNORMAL HIGH (ref 0.44–1.00)
GFR, Estimated: 46 mL/min — ABNORMAL LOW (ref >60)
Glucose, Bld: 101 mg/dL — ABNORMAL HIGH (ref 70–99)
Potassium: 3.6 mmol/L (ref 3.5–5.1)
Sodium: 135 mmol/L (ref 135–145)
Total Bilirubin: 0.9 mg/dL (ref 0.3–1.2)
Total Protein: 6.5 g/dL (ref 6.5–8.1)

## 2022-01-09 LAB — CBC
HCT: 30.4 % — ABNORMAL LOW (ref 36.0–46.0)
Hemoglobin: 11.2 g/dL — ABNORMAL LOW (ref 12.0–15.0)
MCH: 38.5 pg — ABNORMAL HIGH (ref 26.0–34.0)
MCHC: 36.8 g/dL — ABNORMAL HIGH (ref 30.0–36.0)
MCV: 104.5 fL — ABNORMAL HIGH (ref 80.0–100.0)
Platelets: 314 10*3/uL (ref 150–400)
RBC: 2.91 MIL/uL — ABNORMAL LOW (ref 3.87–5.11)
RDW: 14.5 % (ref 11.5–15.5)
WBC: 8.4 10*3/uL (ref 4.0–10.5)
nRBC: 0 % (ref 0.0–0.2)

## 2022-01-09 LAB — IRON AND TIBC
Iron: 33 ug/dL (ref 28–170)
Saturation Ratios: 12 % (ref 10.4–31.8)
TIBC: 267 ug/dL (ref 250–450)
UIBC: 234 ug/dL

## 2022-01-09 LAB — VITAMIN B12: Vitamin B-12: 923 pg/mL — ABNORMAL HIGH (ref 180–914)

## 2022-01-09 LAB — FOLATE: Folate: >40 ng/mL (ref >5.9)

## 2022-01-09 NOTE — Progress Notes (Signed)
PROGRESS NOTE    [name]  SWH:[id number] DOB: [birth year] DOA: 01/08/2022 PCP: [name], MD  Outpatient Specialists:     Brief Narrative:  As per H&P done on admission: "[name] is a 88 y.o. female with medical history significant of memory loss, skin cancer, peptic ulcer disease, anemia, hypothyroidism, hypertension, diverticulosis, CKD 3, venous insufficiency, atrial fibrillation, diastolic CHF presenting with generalized weakness.   Patient had been fairly independent at her independent living facility but for the past several days she has had significant worsening fatigue and generalized weakness.  Also has had decreased p.o. intake in addition to staying her bed most of the day in the last couple days.  She does live alone at this facility but get some home health 3 times a week.   She has chronic lower extremity ulcers from her venous stasis and recently started treatment for possible cellulitis about a week ago.  Started on a Omadacyclin.  She denies fevers, chills, chest pain, shortness breath, abdominal pain, constipation, diarrhea, nausea, vomiting.   ED Course: Vital signs in the ED significant for blood pressure in the 665L to 935T systolic, respiratory rate in the teens to 20s.  Lab workup included CMP with creatinine near baseline at 1.18, glucose 110, calcium 8.8, protein 6.4, albumin 2.4.  CBC with anemia stable 10.8.  BNP mildly elevated at 504.  Troponin initially mildly elevated at 22 but normal on repeat.  TSH normal.  Rester panel for flu and COVID-negative.  Urinalysis with leukocytes and bacteria.  Urine culture and blood culture pending.  Magnesium and phosphorus normal.  Chest x-ray with mild cardiomegaly but no acute normality.  Patient received dose of ceftriaxone, 1.5 L of IV fluids in the ED".  01/09/2022: Patient seen.  Patient is not a particularly good historian.  Patient continues to report feeling hot.  Temperature was rechecked and came  back normal.  Patient reports feeling unwell, but could not elaborate further.   Assessment & Plan:   Principal Problem:   Generalized weakness Active Problems:   Hypothyroidism   Iron deficiency anemia   Essential hypertension   Atrial flutter (HCC)   Peptic ulcer   UTI (urinary tract infection)   Memory loss   Skin cancer   CKD (chronic kidney disease) stage 3, GFR 30-59 ml/min (HCC)   Failure to thrive/generalized weakness UTI > Patient presenting with 3 days of worsening weakness/fatigue.  Also with decreased p.o. intake. > Reportedly has been living fairly independently at independent living facility but with his fatigue she has had trouble even getting up out of bed.  She only has home health 3 times a week. > Lab workup largely reassuring in the ED.  Did have evidence of possible UTI with leukocytes and bacteria in her urine with no other explanation she has been started on ceftriaxone. > Of note, she has been started on a tetracycline week ago for concern for cellulitis of the lower extremities which at this time appears to be stable/improving from outpatient visit (though less likely, if urine culture is negative, this medication could cause fatigue). - Monitor on MedSurg unit overnight - Continue with ceftriaxone - Follow-up urine culture and blood cultures - Trend fever curve and WBC - PT/OT eval and treat 01/09/2022: Follow cultures.  Supportive care.  Pursue echocardiogram.  Follow PT OT recommendations.  Admit patient to inpatient, telemetry monitoring.   Venous stasis Venous stasis ulcers Recent cellulitis > Known history of venous stasis and subsequent ulceration  that she follows with wound care for.  Concern for recent cellulitis and started on a Omadacycline outpatient.  This appears to be stable/improving.  No leukocytosis. - Holding outpatient antibiotic, as we do not have in the hospital and she did not bring her supply with her - Will be on ceftriaxone and  this has similar coverage to outpatient antibiotic - Trend fever curve and WBC - PT/OT eval and treat - WOC consulted in the ED   PUD - Continue home Pepcid   Anemia -Hemoglobin is 11.2 g/dL today. -MCV is elevated at 104.5. -Check folate, vitamin B12 and iron studies. -Check serum free light chain/UPEP/SPEP. -AST and ALT normal.   Hypothyroidism - Continue home Synthroid   Hypertension - Continue home diltiazem and metoprolol   CKD 3 -Creatinine today is 1.11 with EGFR of 46 mL/min per 1.73 m. -Continue to monitor renal function and electrolytes -Avoid nephrotoxins. -Keep MAP greater than 65 mmHg.   Atrial fibrillation - Continue home diltiazem, metoprolol, Eliquis   Diastolic CHF > Chronic history of this on Lasix twice daily.  Last echo was in 2019 with EF 55%, indeterminate diastolic function. -Repeat echocardiogram. - Holding Lasix in the setting of decreased p.o. intake/mild dehydration - Continue home metoprolol     DVT prophylaxis: Eliquis Code Status: DNR/DNI Family Communication:  Disposition Plan:    Consultants:  None  Procedures:    Antimicrobials:  IV Rocephin.   Subjective: "Feeling hot" -Feeling unwell.  Objective: Vitals:   01/08/22 2346 01/09/22 0356 01/09/22 0850 01/09/22 0951  BP:  128/74 (!) 123/55 125/78  Pulse:  87 91   Resp:  16 16   Temp:  97.7 F (36.5 C) (!) 97.5 F (36.4 C)   TempSrc:  Oral Oral   SpO2:  99% 99%   Weight: 67.7 kg     Height: 5' 5" (1.651 m)       Intake/Output Summary (Last 24 hours) at 01/09/2022 1134 Last data filed at 01/08/2022 1903 Gross per 24 hour  Intake 600 ml  Output --  Net 600 ml   Filed Weights   01/08/22 2346  Weight: 67.7 kg    Examination:  General exam: Appears calm and comfortable.  Patient is pale. Respiratory system: Clear to auscultation.  Cardiovascular system: S1 & S2 heard Gastrointestinal system: Abdomen is soft and nontender.  Central nervous system: Alert  and oriented. No focal neurological deficits. Extremities: Both lower legs are wrapped.  Data Reviewed: I have personally reviewed following labs and imaging studies  CBC: Recent Labs  Lab 01/08/22 1255 01/09/22 0533  WBC 7.1 8.4  HGB 10.8* 11.2*  HCT 29.3* 30.4*  MCV 108.1* 104.5*  PLT 322 270   Basic Metabolic Panel: Recent Labs  Lab 01/08/22 1255 01/09/22 0533  NA 136 135  K 4.2 3.6  CL 98 100  CO2 27 27  GLUCOSE 110* 101*  BUN 21 13  CREATININE 1.18* 1.11*  CALCIUM 8.8* 8.6*  MG 1.8  --   PHOS 3.2  --    GFR: Estimated Creatinine Clearance: 27.9 mL/min (A) (by C-G formula based on SCr of 1.11 mg/dL (H)). Liver Function Tests: Recent Labs  Lab 01/08/22 1255 01/09/22 0533  AST 33 32  ALT 17 17  ALKPHOS 61 62  BILITOT 1.0 0.9  PROT 6.4* 6.5  ALBUMIN 2.4* 2.3*   No results for input(s): "LIPASE", "AMYLASE" in the last 168 hours. No results for input(s): "AMMONIA" in the last 168 hours. Coagulation Profile: No results  for input(s): "INR", "PROTIME" in the last 168 hours. Cardiac Enzymes: No results for input(s): "CKTOTAL", "CKMB", "CKMBINDEX", "TROPONINI" in the last 168 hours. BNP (last 3 results) No results for input(s): "PROBNP" in the last 8760 hours. HbA1C: No results for input(s): "HGBA1C" in the last 72 hours. CBG: No results for input(s): "GLUCAP" in the last 168 hours. Lipid Profile: No results for input(s): "CHOL", "HDL", "LDLCALC", "TRIG", "CHOLHDL", "LDLDIRECT" in the last 72 hours. Thyroid Function Tests: Recent Labs    01/08/22 1255  TSH 0.885   Anemia Panel: No results for input(s): "VITAMINB12", "FOLATE", "FERRITIN", "TIBC", "IRON", "RETICCTPCT" in the last 72 hours. Urine analysis:    Component Value Date/Time   COLORURINE YELLOW 01/08/2022 1600   APPEARANCEUR HAZY (A) 01/08/2022 1600   LABSPEC 1.004 (L) 01/08/2022 1600   PHURINE 7.0 01/08/2022 1600   GLUCOSEU NEGATIVE 01/08/2022 1600   GLUCOSEU NEGATIVE 01/17/2015 1415    HGBUR NEGATIVE 01/08/2022 1600   BILIRUBINUR NEGATIVE 01/08/2022 1600   KETONESUR NEGATIVE 01/08/2022 1600   PROTEINUR NEGATIVE 01/08/2022 1600   UROBILINOGEN 0.2 01/17/2015 1415   NITRITE NEGATIVE 01/08/2022 1600   LEUKOCYTESUR LARGE (A) 01/08/2022 1600   Sepsis Labs: _0 (procalcitonin:4,lacticidven:4)  ) Recent Results (from the past 240 hour(s))  Resp panel by RT-PCR (RSV, Flu A&B, Covid) Anterior Nasal Swab     Status: None   Collection Time: 01/08/22 12:16 PM   Specimen: Anterior Nasal Swab  Result Value Ref Range Status   SARS Coronavirus 2 by RT PCR NEGATIVE NEGATIVE Final    Comment: (NOTE) SARS-CoV-2 target nucleic acids are NOT DETECTED.  The SARS-CoV-2 RNA is generally detectable in upper respiratory specimens during the acute phase of infection. The lowest concentration of SARS-CoV-2 viral copies this assay can detect is 138 copies/mL. A negative result does not preclude SARS-Cov-2 infection and should not be used as the sole basis for treatment or other patient management decisions. A negative result may occur with  improper specimen collection/handling, submission of specimen other than nasopharyngeal swab, presence of viral mutation(s) within the areas targeted by this assay, and inadequate number of viral copies(<138 copies/mL). A negative result must be combined with clinical observations, patient history, and epidemiological information. The expected result is Negative.  Fact Sheet for Patients:  EntrepreneurPulse.com.au  Fact Sheet for Healthcare Providers:  IncredibleEmployment.be  This test is no t yet approved or cleared by the [place] FDA and  has been authorized for detection and/or diagnosis of SARS-CoV-2 by FDA under an Emergency Use Authorization (EUA). This EUA will remain  in effect (meaning this test can be used) for the duration of the COVID-19 declaration under Section 564(b)(1) of the Act,  21 U.S.C.section 360bbb-3(b)(1), unless the authorization is terminated  or revoked sooner.       Influenza A by PCR NEGATIVE NEGATIVE Final   Influenza B by PCR NEGATIVE NEGATIVE Final    Comment: (NOTE) The Xpert Xpress SARS-CoV-2/FLU/RSV plus assay is intended as an aid in the diagnosis of influenza from Nasopharyngeal swab specimens and should not be used as a sole basis for treatment. Nasal washings and aspirates are unacceptable for Xpert Xpress SARS-CoV-2/FLU/RSV testing.  Fact Sheet for Patients: EntrepreneurPulse.com.au  Fact Sheet for Healthcare Providers: IncredibleEmployment.be  This test is not yet approved or cleared by the [place] FDA and has been authorized for detection and/or diagnosis of SARS-CoV-2 by FDA under an Emergency Use Authorization (EUA). This EUA will remain in effect (meaning this test can be used) for the duration of  the COVID-19 declaration under Section 564(b)(1) of the Act, 21 U.S.C. section 360bbb-3(b)(1), unless the authorization is terminated or revoked.     Resp Syncytial Virus by PCR NEGATIVE NEGATIVE Final    Comment: (NOTE) Fact Sheet for Patients: EntrepreneurPulse.com.au  Fact Sheet for Healthcare Providers: IncredibleEmployment.be  This test is not yet approved or cleared by the [place] FDA and has been authorized for detection and/or diagnosis of SARS-CoV-2 by FDA under an Emergency Use Authorization (EUA). This EUA will remain in effect (meaning this test can be used) for the duration of the COVID-19 declaration under Section 564(b)(1) of the Act, 21 U.S.C. section 360bbb-3(b)(1), unless the authorization is terminated or revoked.  Performed at [facility], [place] [address] [place], [place] [address]   Blood culture (routine x 2)     Status: None (Preliminary result)   Collection Time: 01/08/22  5:39 PM   Specimen: BLOOD LEFT FOREARM   Result Value Ref Range Status   Specimen Description BLOOD LEFT FOREARM  Final   Special Requests   Final    BOTTLES DRAWN AEROBIC AND ANAEROBIC Blood Culture results may not be optimal due to an excessive volume of blood received in culture bottles   Culture   Final    NO GROWTH < 12 HOURS Performed at [facility], [place] [address], [place], [place] [address]    Report Status PENDING  Incomplete         Radiology Studies: DG Chest Port 1 View  Result Date: 01/08/2022 CLINICAL DATA:  Upper respiratory tract symptoms.  Weakness. EXAM: PORTABLE CHEST 1 VIEW COMPARISON:  10/07/2018 FINDINGS: Mild cardiac enlargement. Aortic atherosclerotic calcifications. No pleural effusion or edema. No airspace disease. The visualized osseous structures are unremarkable. IMPRESSION: Mild cardiac enlargement.  No acute findings. Electronically Signed   By: Kerby Moors M.D.   On: 01/08/2022 12:44        Scheduled Meds:  apixaban  5 mg Oral BID   diltiazem  120 mg Oral Daily   famotidine  10 mg Oral Daily   levothyroxine  25 mcg Oral QAC breakfast   metoprolol succinate  25 mg Oral Daily   sodium chloride flush  3 mL Intravenous Q12H   Continuous Infusions:  cefTRIAXone (ROCEPHIN)  IV       LOS: 0 days    Time spent: 55 minutes.    Dana Allan, MD  [facility] Pager #: [phone] 7PM-7AM contact night coverage as above

## 2022-01-09 NOTE — Telephone Encounter (Signed)
MD completed the [facility] faxed back to [facility] this am../lmb

## 2022-01-09 NOTE — Telephone Encounter (Signed)
Faxed FL2 form back to [employer].Marland KitchenJohny Bell

## 2022-01-09 NOTE — Evaluation (Signed)
Occupational Therapy Evaluation Patient Details Name: [name] MRN: [id number] DOB: [birth year] Today's Date: 01/09/2022   History of Present Illness 88 yo female admitted 12/20 with weakness and UTI from [place]. PMhx: Afib, breast CA, GERD, colitis, HTn, HLD, CHF   Clinical Impression   Patient admitted for the diagnosis above.  PTA she lives at a local ILF, used a [id number] for mobility, and was independent at baseline for sponge baths.  The patient typically walks to the common dining area for meals.  Unsteadiness and generalized weakness are the primary deficits impacting independence.  Currently she is needing Min Guard to Supervision for mobility and ADL completion.  OT is indicated in the acute setting to maximize her functional status, and assist with the eventual transition home.  [place] OT may be considered depending on progress.       Recommendations for follow up therapy are one component of a multi-disciplinary discharge planning process, led by the attending physician.  Recommendations may be updated based on patient status, additional functional criteria and insurance authorization.   Follow Up Recommendations  Other (comment) ([place] OT depending on progress.)     Assistance Recommended at Discharge Set up Supervision/Assistance  Patient can return home with the following Assist for transportation;Assistance with cooking/housework    Functional Status Assessment  Patient has had a recent decline in their functional status and demonstrates the ability to make significant improvements in function in a reasonable and predictable amount of time.  Equipment Recommendations  None recommended by OT    Recommendations for Other Services       Precautions / Restrictions Precautions Precautions: Fall Restrictions Weight Bearing Restrictions: No      Mobility Bed Mobility Overal bed mobility: Needs Assistance Bed Mobility: Sit to Supine       Sit to supine:  Supervision        Transfers Overall transfer level: Needs assistance   Transfers: Sit to/from Stand, Bed to chair/wheelchair/BSC Sit to Stand: Min guard     Step pivot transfers: Min guard            Balance Overall balance assessment: Needs assistance Sitting-balance support: Feet supported Sitting balance-Leahy Scale: Good     Standing balance support: Reliant on assistive device for balance Standing balance-Leahy Scale: Poor                             ADL either performed or assessed with clinical judgement   ADL       Grooming: Wash/dry hands;Wash/dry face;Set up;Sitting           Upper Body Dressing : Set up;Sitting   Lower Body Dressing: Min guard;Sit to/from stand   Toilet Transfer: Min guard;Stand-pivot;BSC/3in1                   Vision Patient Visual Report: No change from baseline       Perception     Praxis      Pertinent Vitals/Pain Pain Assessment Pain Assessment: No/denies pain     Hand Dominance Right   Extremity/Trunk Assessment Upper Extremity Assessment Upper Extremity Assessment: Generalized weakness   Lower Extremity Assessment Lower Extremity Assessment: Defer to PT evaluation   Cervical / Trunk Assessment Cervical / Trunk Assessment: Kyphotic   Communication Communication Communication: No difficulties   Cognition Arousal/Alertness: Awake/alert Behavior During Therapy: Flat affect, Anxious Overall Cognitive Status: Within Functional Limits for tasks assessed  General Comments   VSS on RA    Exercises     Shoulder Instructions      Home Living Family/patient expects to be discharged to:: Private residence Living Arrangements: Alone Available Help at Discharge: Family;Available PRN/intermittently Type of Home: Independent living facility Home Access: Level entry     Home Layout: One level     Bathroom Shower/Tub: Walk-in  shower;Sponge bathes at baseline   Bathroom Toilet: Handicapped height Bathroom Accessibility: Yes How Accessible: Accessible via walker Home Equipment: Rollator (4 wheels);Grab bars - tub/shower;Shower seat          Prior Functioning/Environment Prior Level of Function : Independent/Modified Independent             Mobility Comments: reprots she walks with rollator ADLs Comments: Ind with ADL, medications.  Facility provides meals.        OT Problem List: Impaired balance (sitting and/or standing);Decreased activity tolerance;Decreased strength      OT Treatment/Interventions: Self-care/ADL training;Therapeutic activities;Patient/family education;Balance training    OT Goals(Current goals can be found in the care plan section) Acute Rehab OT Goals Patient Stated Goal: Feel better OT Goal Formulation: With patient Time For Goal Achievement: 01/23/22 Potential to Achieve Goals: Good  OT Frequency: Min 2X/week    Co-evaluation              AM-PAC OT "6 Clicks" Daily Activity     Outcome Measure Help from another person eating meals?: None Help from another person taking care of personal grooming?: None Help from another person toileting, which includes using toliet, bedpan, or urinal?: A Little Help from another person bathing (including washing, rinsing, drying)?: A Little Help from another person to put on and taking off regular upper body clothing?: None Help from another person to put on and taking off regular lower body clothing?: A Little 6 Click Score: 21   End of Session Equipment Utilized During Treatment: Rolling walker (2 wheels) Nurse Communication: Mobility status  Activity Tolerance: Patient tolerated treatment well Patient left: in bed;with call bell/phone within reach;with bed alarm set  OT Visit Diagnosis: Unsteadiness on feet (R26.81);Muscle weakness (generalized) (M62.81)                Time: 8453-6468 OT Time Calculation (min): 17  min Charges:  OT General Charges $OT Visit: 1 Visit OT Evaluation $OT Eval Moderate Complexity: 1 Mod  01/09/2022  RP, OTR/L  Acute Rehabilitation Services  Office:  [phone]   [name] 01/09/2022, 12:34 PM

## 2022-01-09 NOTE — Consult Note (Signed)
[facility] Nurse Consult Note: Reason for Consult: Consult requested for bilat leg wounds.  Bilat legs with generalized edema and patchy areas of partial thickness skin loss, loose peeling skin plaques, and dry brown scabs.  Pt has been followed at the outpatient wound care center prior to admission for chronic full thickness wounds to the following locations:  Left outer ankle 4X3X.2cm Left inner ankle 2X2X.2cm; both red and moist with small amt bloody drainage.   Right posterior calf 3X2X.2cm Right outer ankle 7X4X.2cm Right inner ankle 3X3X.2cm Dressing procedure/placement/frequency: Topical treatment orders provided for bedside nurses to perform as follows to promote moist healing and reduce edema: Bedside nurse change bilateral leg dressings every Friday and Monday as follows:  clean with soap and water, pat dry, apply Xeroform gauze to open areas, cover with ABD pads, starting from right behind toes to below knee wrap with kerlix in a spiral fashion then cover with ace wrap applied in the same fashion. Pt should follow-up with the out patient wound care center after discharge. Please re-consult if further assistance is needed.  Thank-you,  Julien Girt MSN, [place], [place], [place], [place]

## 2022-01-09 NOTE — Progress Notes (Signed)
Pt has arrived to [facility] [facility]. Alert and oriented x 3. Identified appropriately. VS stable, denied chest pain, SOB no signs of acute distress. Cardiac monitor placed and CCMD notified. Pt oriented to room and equipment, instructed to use call bell for assistance and call bell left within pt reach. Bed alarm activated, all questions answered. Will continue to monitor pt.

## 2022-01-09 NOTE — Plan of Care (Signed)

## 2022-01-09 NOTE — NC FL2 (Signed)
[place] LEVEL OF CARE FORM     IDENTIFICATION  Patient Name: [name] Birthdate: 06/02/1925 Sex: female Admission Date (Current Location): 01/08/2022  [facility] and [place] Number:  Herbalist and Address:  The . [facility] [place] [address], [place],  [address]      Provider Number: [id number]  Attending Physician Name and Address:  Bonnell Public, MD  Relative Name and Phone Number:       Current Level of Care: Hospital Recommended Level of Care: [place] Prior Approval Number:    Date Approved/Denied:   PASRR Number: [id number]  Discharge Plan: SNF    Current Diagnoses: Patient Active Problem List   Diagnosis Date Noted   FTT (failure to thrive) in adult 01/09/2022   Generalized weakness 01/08/2022   Cellulitis and abscess of leg 12/31/2021   Hyperkeratosis 09/17/2021   Grief 02/05/2021   COVID-19 10/31/2020   Weight loss 10/31/2020   Squamous cell carcinoma of lower leg 06/08/2020   CKD (chronic kidney disease) stage 3, GFR 30-59 ml/min ([place]) 02/27/2020   Acute bilateral low back pain with bilateral sciatica 09/28/2019   Compression fracture of T12 vertebra ([place]) 09/28/2019   Chronic pain of both shoulders 09/04/2019   Thoracic spine pain 09/01/2019   Skin cancer 06/16/2019   Atrial fibrillation ([place]) 03/24/2019   Memory loss 11/15/2018   Herpes zoster 10/07/2018   Chronic osteomyelitis of left tibia ([place]) 05/23/2017   Osteoporosis 08/22/2016   Breast mass in female 07/25/2015   Left breast mass 07/05/2015   Incontinence in female 07/05/2014   Edema 01/17/2014   Rash 09/06/2013   Sepsis ([place]) 10/93/2355   Metabolic acidosis 73/22/0254   Diverticulitis of colon with perforation 08/25/2013   Tibia/fibula fracture, shaft, left, open type I or II, with routine healing, subsequent encounter 06/28/2013   Constipation 06/22/2013   Open tibial fracture 06/17/2013   Goals of care,  counseling/discussion 02/16/2013   Well adult exam 07/21/2012   Paresthesia 01/28/2012   Chronic venous insufficiency 11/11/2010   UTI (urinary tract infection) 05/30/2010   Actinic keratoses 05/30/2010   Long term current use of anticoagulant 03/12/2010   Peptic ulcer 11/22/2009   Iron deficiency anemia 10/31/2009   HIP PAIN 10/31/2009   Hypothyroidism 10/25/2009   Disorder resulting from impaired renal function 03/19/2009   INSOMNIA, PERSISTENT 11/07/2008   Low back pain 09/04/2008   Atrial flutter ([place]) 05/31/2008   Essential hypertension 11/30/2006   Osteoarthritis 11/30/2006   Disorder of bone and cartilage 11/30/2006    Orientation RESPIRATION BLADDER Height & Weight     Self, Time, Situation, Place  Normal Continent Weight: 149 lb 4 oz (67.7 kg) Height:  '5\' 5"'$  (165.1 cm)  BEHAVIORAL SYMPTOMS/MOOD NEUROLOGICAL BOWEL NUTRITION STATUS      Continent Diet  AMBULATORY STATUS COMMUNICATION OF NEEDS Skin   Limited Assist Verbally Other (Comment) (Bilat legs with generalized edema and patchy areas of partial thickness skin loss, loose peeling skin plaques, and dry brown scabs. Please see WOC note or discharge summary for care instructions)                       Personal Care Assistance Level of Assistance  Bathing, Feeding, Dressing Bathing Assistance: Limited assistance Feeding assistance: Independent Dressing Assistance: Limited assistance     Functional Limitations Info  Sight, Hearing, Speech Sight Info: Adequate Hearing Info: Adequate Speech Info: Adequate    SPECIAL CARE FACTORS FREQUENCY  PT (By licensed PT), OT (By licensed OT)     PT Frequency: 5x weekly OT Frequency: 5x weekly            Contractures Contractures Info: Not present    Additional Factors Info  Code Status, Allergies Code Status Info: DNR Allergies Info: Zosyn (Piperacillin Sod-tazobactam So)  Atorvastatin  Zoloft (Sertraline)  Atenolol  Clarithromycin  Codeine Sulfate  Levaquin  (Levofloxacin)  Macrodantin  Oxycodone-acetaminophen           Current Medications (01/09/2022):  This is the current hospital active medication list Current Facility-Administered Medications  Medication Dose Route Frequency Provider Last Rate Last Admin   acetaminophen (TYLENOL) tablet 650 mg  650 mg Oral Q6H PRN Marcelyn Bruins, MD       Or   acetaminophen (TYLENOL) suppository 650 mg  650 mg Rectal Q6H PRN Marcelyn Bruins, MD       apixaban Arne [name]) tablet 5 mg  5 mg Oral BID Marcelyn Bruins, MD   5 mg at 01/09/22 1700   cefTRIAXone (ROCEPHIN) 2 g in sodium chloride 0.9 % 100 mL IVPB  2 g Intravenous Q24H Marcelyn Bruins, MD       diltiazem (CARDIZEM CD) 24 hr capsule 120 mg  120 mg Oral Daily Marcelyn Bruins, MD   120 mg at 01/09/22 1749   famotidine (PEPCID) tablet 10 mg  10 mg Oral Daily Marcelyn Bruins, MD   10 mg at 01/09/22 4496   levothyroxine (SYNTHROID) tablet 25 mcg  25 mcg Oral QAC breakfast Marcelyn Bruins, MD   25 mcg at 01/09/22 7591   metoprolol succinate (TOPROL-XL) 24 hr tablet 25 mg  25 mg Oral Daily Marcelyn Bruins, MD   25 mg at 01/09/22 0951   polyethylene glycol (MIRALAX / GLYCOLAX) packet 17 g  17 g Oral Daily PRN Marcelyn Bruins, MD       sodium chloride flush (NS) 0.9 % injection 3 mL  3 mL Intravenous Q12H Marcelyn Bruins, MD   3 mL at 01/09/22 1001     Discharge Medications: Please see discharge summary for a list of discharge medications.  Relevant Imaging Results:  Relevant Lab Results:   Additional Information SSN: [id number]  Archie Endo, LCSW

## 2022-01-09 NOTE — Evaluation (Signed)
Physical Therapy Evaluation Patient Details Name: [name] MRN: [id number] DOB: [birth year] Today's Date: 01/09/2022  History of Present Illness  88 yo female admitted 12/20 with weakness and UTI from [place]. PMhx: Afib, breast CA, GERD, colitis, HTn, HLD, CHF  Clinical Impression  Pt with flat affect, labile and stating "humiliation that she did this". Pt convinced that daughter is trying to embarrass her and that's why she's in the hospital. Pt able to walk limited distance in hall (limited by her fear that "someone will see me"). Pt with decreased cognition, strength and mobility who normally resides in an ILF. Pt currently requires assist for safety and mobility and would benefit from ALF long term with HHPT. However pt may need ST-SNF to allow assist with mobility to arrange ALF, no family present to discuss. Will follow acutely to maximize mobility, safety and function to decrease fall risk.   98% on RA        Recommendations for follow up therapy are one component of a multi-disciplinary discharge planning process, led by the attending physician.  Recommendations may be updated based on patient status, additional functional criteria and insurance authorization.  Follow Up Recommendations Home health PT (recommend HHPT at ALF. If pt needs additional time to move from ILF to ALF ST-SNf is feasible for transition)      Assistance Recommended at Discharge Intermittent Supervision/Assistance  Patient can return home with the following  A little help with walking and/or transfers;A little help with bathing/dressing/bathroom;Assistance with cooking/housework    Equipment Recommendations None recommended by PT  Recommendations for Other Services  OT consult    Functional Status Assessment Patient has had a recent decline in their functional status and/or demonstrates limited ability to make significant improvements in function in a reasonable and predictable amount of time      Precautions / Restrictions Precautions Precautions: Fall      Mobility  Bed Mobility Overal bed mobility: Needs Assistance Bed Mobility: Supine to Sit     Supine to sit: Supervision          Transfers Overall transfer level: Needs assistance   Transfers: Sit to/from Stand Sit to Stand: Supervision           General transfer comment: supervision for safety with cues to initiate and sequence    Ambulation/Gait Ambulation/Gait assistance: Min guard Gait Distance (Feet): 100 Feet Assistive device: Rolling walker (2 wheels) Gait Pattern/deviations: Step-through pattern, Decreased stride length, Trunk flexed   Gait velocity interpretation: <1.8 ft/sec, indicate of risk for recurrent falls   General Gait Details: cues for posture, proximity to RW and safety, encouragement to maximize distance  Stairs            Wheelchair Mobility    Modified Rankin (Stroke Patients Only)       Balance Overall balance assessment: Needs assistance   Sitting balance-Leahy Scale: Fair     Standing balance support: Bilateral upper extremity supported, Reliant on assistive device for balance Standing balance-Leahy Scale: Poor                               Pertinent Vitals/Pain Pain Assessment Pain Assessment: No/denies pain    Home Living Family/patient expects to be discharged to:: Private residence Living Arrangements: Alone Available Help at Discharge: Family;Available PRN/intermittently Type of Home: Independent living facility Home Access: Level entry       Home Layout: One level Home Equipment: Rollator (4  wheels)      Prior Function Prior Level of Function : Independent/Modified Independent             Mobility Comments: reprots she walks with rollator ADLs Comments: walks to dining hall for evening meal, sponge bathes at baseline due to leg wrappings     Hand Dominance        Extremity/Trunk Assessment   Upper Extremity  Assessment Upper Extremity Assessment: Generalized weakness    Lower Extremity Assessment Lower Extremity Assessment: Generalized weakness    Cervical / Trunk Assessment Cervical / Trunk Assessment: Kyphotic  Communication      Cognition Arousal/Alertness: Awake/alert Behavior During Therapy:  (labile) Overall Cognitive Status: Within Functional Limits for tasks assessed                                 General Comments: pt perseverating on "my daughter did this to me", "I'm so humiliated"        General Comments      Exercises     Assessment/Plan    PT Assessment Patient needs continued PT services  PT Problem List Decreased strength;Decreased mobility;Decreased safety awareness;Decreased activity tolerance;Decreased cognition;Decreased balance;Decreased knowledge of use of DME       PT Treatment Interventions DME instruction;Therapeutic activities;Gait training;Therapeutic exercise;Patient/family education;Functional mobility training;Balance training    PT Goals (Current goals can be found in the Care Plan section)  Acute Rehab PT Goals Patient Stated Goal: return home PT Goal Formulation: With patient Time For Goal Achievement: 01/23/22 Potential to Achieve Goals: Fair    Frequency Min 2X/week     Co-evaluation               AM-PAC PT "6 Clicks" Mobility  Outcome Measure Help needed turning from your back to your side while in a flat bed without using bedrails?: None Help needed moving from lying on your back to sitting on the side of a flat bed without using bedrails?: None Help needed moving to and from a bed to a chair (including a wheelchair)?: A Little Help needed standing up from a chair using your arms (e.g., wheelchair or bedside chair)?: A Little Help needed to walk in hospital room?: A Little Help needed climbing 3-5 steps with a railing? : A Lot 6 Click Score: 19    End of Session Equipment Utilized During Treatment: Gait  belt Activity Tolerance: Patient tolerated treatment well Patient left: in chair;with call bell/phone within reach;with chair alarm set Nurse Communication: Mobility status PT Visit Diagnosis: Other abnormalities of gait and mobility (R26.89);Muscle weakness (generalized) (M62.81)    Time: 3790-2409 PT Time Calculation (min) (ACUTE ONLY): 19 min   Charges:   PT Evaluation $PT Eval Moderate Complexity: 1 Mod          [place], PT Acute Rehabilitation Services Office: [phone]   [name] 01/09/2022, 11:20 AM

## 2022-01-09 NOTE — Telephone Encounter (Signed)
Done. Thank you.

## 2022-01-10 ENCOUNTER — Inpatient Hospital Stay (HOSPITAL_COMMUNITY): Payer: Medicare Other

## 2022-01-10 DIAGNOSIS — I5032 Chronic diastolic (congestive) heart failure: Secondary | ICD-10-CM

## 2022-01-10 DIAGNOSIS — R531 Weakness: Secondary | ICD-10-CM | POA: Diagnosis not present

## 2022-01-10 LAB — ECHOCARDIOGRAM COMPLETE
AR max vel: 1.37 cm2
AV Area VTI: 1.02 cm2
AV Area mean vel: 1.28 cm2
AV Mean grad: 6 mmHg
AV Peak grad: 11.8 mmHg
Ao pk vel: 1.72 m/s
Area-P 1/2: 5.13 cm2
Calc EF: 55 %
Height: 65 in
MV M vel: 4.97 m/s
MV Peak grad: 98.8 mmHg
S' Lateral: 2.4 cm
Single Plane A2C EF: 53.2 %
Single Plane A4C EF: 60.9 %
Weight: 2264.57 oz

## 2022-01-10 NOTE — TOC Progression Note (Signed)
Transition of Care [facility]) - Progression Note    Patient Details  Name: [name] MRN: [id number] Date of Birth: 10/03/1925  Transition [facility]) CM/SW [place], LCSW Phone Number: 01/10/2022, 12:45 PM  Clinical Narrative:    CSW received call from [facility] for update. They prefer for patient to have a 3 midnight stay, but if not can utilize her [facility] waiver. [facility] can accept on Saturday but will not be accepting admissions again until Tuesday.    Expected Discharge Plan: [place] Barriers to Discharge: Continued Medical Work up  Expected Discharge Plan and Services In-house Referral: Clinical Social Work   Post Acute Care Choice: [place] Living arrangements for the past 2 months: [place]                                       Social Determinants of Health (SDOH) Interventions [place]: No Food Insecurity (12/02/2021)  Housing: Low Risk  (12/02/2021)  Transportation Needs: No Transportation Needs (12/02/2021)  Utilities: Not At Risk (12/02/2021)  Alcohol Screen: Low Risk  (12/02/2021)  Depression (PHQ2-9): Medium Risk (12/02/2021)  Financial Resource Strain: Low Risk  (12/02/2021)  Physical Activity: Inactive (12/02/2021)  Social Connections: Unknown (12/02/2021)  Stress: No Stress Concern Present (12/02/2021)  Tobacco Use: Low Risk  (01/08/2022)    Readmission Risk Interventions     No data to display

## 2022-01-10 NOTE — Progress Notes (Signed)
  Echocardiogram 2D Echocardiogram has been performed.  [name] 01/10/2022, 10:13 AM

## 2022-01-10 NOTE — Progress Notes (Signed)
PROGRESS NOTE    [name]  ZYY:[id number] DOB: 06-28-1925 DOA: 01/08/2022 PCP: [name], MD  Outpatient Specialists:     Brief Narrative:  As per H&P done on admission: "[name] is a 88 y.o. female with medical history significant of memory loss, skin cancer, peptic ulcer disease, anemia, hypothyroidism, hypertension, diverticulosis, CKD 3, venous insufficiency, atrial fibrillation, diastolic CHF presenting with generalized weakness.   Patient had been fairly independent at her independent living facility but for the past several days she has had significant worsening fatigue and generalized weakness.  Also has had decreased p.o. intake in addition to staying her bed most of the day in the last couple days.  She does live alone at this facility but get some home health 3 times a week.   She has chronic lower extremity ulcers from her venous stasis and recently started treatment for possible cellulitis about a week ago.  Started on a Omadacyclin.  She denies fevers, chills, chest pain, shortness breath, abdominal pain, constipation, diarrhea, nausea, vomiting.   ED Course: Vital signs in the ED significant for blood pressure in the 488Q to 916X systolic, respiratory rate in the teens to 20s.  Lab workup included CMP with creatinine near baseline at 1.18, glucose 110, calcium 8.8, protein 6.4, albumin 2.4.  CBC with anemia stable 10.8.  BNP mildly elevated at 504.  Troponin initially mildly elevated at 22 but normal on repeat.  TSH normal.  Rester panel for flu and COVID-negative.  Urinalysis with leukocytes and bacteria.  Urine culture and blood culture pending.  Magnesium and phosphorus normal.  Chest x-ray with mild cardiomegaly but no acute normality.  Patient received dose of ceftriaxone, 1.5 L of IV fluids in the ED".  01/09/2022: Patient seen.  Patient is not a particularly good historian.  Patient continues to report feeling hot.  Temperature was rechecked and came  back normal.  Patient reports feeling unwell, but could not elaborate further.  01/10/2022: Patient seen.  Patient is slowly improving, but remains encephalopathic.  Urine cultures growing Proteus mirabilis.  Albumin is 2.2.  Continue IV ceftriaxone.  Follow final urine cultures.  Consult PT OT.  Dietary consult.  Calorie count.  Further management depend on hospital course.  TOC input to be highly appreciated.   Assessment & Plan:   Principal Problem:   Generalized weakness Active Problems:   Hypothyroidism   Iron deficiency anemia   Essential hypertension   Atrial flutter (HCC)   Peptic ulcer   UTI (urinary tract infection)   Memory loss   Skin cancer   CKD (chronic kidney disease) stage 3, GFR 30-59 ml/min (HCC)   FTT (failure to thrive) in adult   Failure to thrive/generalized weakness UTI > Patient presenting with 3 days of worsening weakness/fatigue.  Also with decreased p.o. intake. > Reportedly has been living fairly independently at independent living facility but with his fatigue she has had trouble even getting up out of bed.  She only has home health 3 times a week. > Lab workup largely reassuring in the ED.  Did have evidence of possible UTI with leukocytes and bacteria in her urine with no other explanation she has been started on ceftriaxone. > Of note, she has been started on a tetracycline week ago for concern for cellulitis of the lower extremities which at this time appears to be stable/improving from outpatient visit (though less likely, if urine culture is negative, this medication could cause fatigue). - Monitor on MedSurg  unit overnight - Continue with ceftriaxone - Follow-up urine culture and blood cultures - Trend fever curve and WBC - PT/OT eval and treat 01/09/2022: Follow cultures.  Supportive care.  Pursue echocardiogram.  Follow PT OT recommendations.  Admit patient to inpatient, telemetry monitoring. 01/10/2022: Urine culture is growing Proteus  mirabilis.  Follow final urine culture.  Continue IV ceftriaxone.  Patient is slowly improving.  PT OT consult.  Dietary consult.  Caloric count.  Encephalopathy, combined, toxic and metabolic: -Urine culture is growing Proteus. -Acute kidney injury slowly resolving. -Patient is slowly improving.   Venous stasis Venous stasis ulcers Recent cellulitis > Known history of venous stasis and subsequent ulceration that she follows with wound care for.  Concern for recent cellulitis and started on a Omadacycline outpatient.  This appears to be stable/improving.  No leukocytosis. - Holding outpatient antibiotic, as we do not have in the hospital and she did not bring her supply with her - Will be on ceftriaxone and this has similar coverage to outpatient antibiotic - Trend fever curve and WBC - PT/OT eval and treat - WOC consulted in the ED   PUD - Continue home Pepcid   Anemia -Hemoglobin is 11.2 g/dL today. -MCV is elevated at 104.5. -Check folate, vitamin B12 and iron studies. -Check serum free light chain/UPEP/SPEP. -AST and ALT normal.   Hypothyroidism - Continue home Synthroid   Hypertension - Continue home diltiazem and metoprolol   CKD 3 versus acute kidney injury: -Creatinine today is 1.11 with EGFR of 46 mL/min per 1.73 m. -Continue to monitor renal function and electrolytes -Avoid nephrotoxins. -Keep MAP greater than 65 mmHg. 01/10/2022: Renal function continues to improve.  Renal panel in the morning.   Atrial fibrillation - Continue home diltiazem, metoprolol, Eliquis   Diastolic CHF > Chronic history of this on Lasix twice daily.  Last echo was in 2019 with EF 55%, indeterminate diastolic function. -Repeat echocardiogram. - Holding Lasix in the setting of decreased p.o. intake/mild dehydration - Continue home metoprolol  Volume depletion: -See above. -Lasix is on hold.     DVT prophylaxis: Eliquis Code Status: DNR/DNI Family Communication:  Disposition  Plan:    Consultants:  None  Procedures:    Antimicrobials:  IV Rocephin.   Subjective: -Patient is slowly improving.  Objective: Vitals:   01/10/22 0010 01/10/22 0357 01/10/22 0500 01/10/22 1519  BP: (!) 128/57 132/66  (!) 124/51  Pulse: 85 90  76  Resp: (!) _0 Temp: 97.7 F (36.5 C) 98.2 F (36.8 C)  98 F (36.7 C)  TempSrc: Oral Oral  Oral  SpO2: 94% 97%  99%  Weight:   64.2 kg   Height:        Intake/Output Summary (Last 24 hours) at 01/10/2022 1804 Last data filed at 01/10/2022 1518 Gross per 24 hour  Intake 720 ml  Output 500 ml  Net 220 ml    Filed Weights   01/08/22 2346 01/10/22 0500  Weight: 67.7 kg 64.2 kg    Examination:  General exam: Appears calm and comfortable.  Patient is pale. Respiratory system: Clear to auscultation.  Cardiovascular system: S1 & S2 heard Gastrointestinal system: Abdomen is soft and nontender.  Central nervous system: Alert and oriented. No focal neurological deficits. Extremities: Both lower legs are wrapped.  Data Reviewed: I have personally reviewed following labs and imaging studies  CBC: Recent Labs  Lab 01/08/22 1255 01/09/22 0533  WBC 7.1 8.4  HGB 10.8* 11.2*  HCT 29.3* 30.4*  MCV 108.1* 104.5*  PLT 322 098    Basic Metabolic Panel: Recent Labs  Lab 01/08/22 1255 01/09/22 0533  NA 136 135  K 4.2 3.6  CL 98 100  CO2 27 27  GLUCOSE 110* 101*  BUN 21 13  CREATININE 1.18* 1.11*  CALCIUM 8.8* 8.6*  MG 1.8  --   PHOS 3.2  --     GFR: Estimated Creatinine Clearance: 27.9 mL/min (A) (by C-G formula based on SCr of 1.11 mg/dL (H)). Liver Function Tests: Recent Labs  Lab 01/08/22 1255 01/09/22 0533  AST 33 32  ALT 17 17  ALKPHOS 61 62  BILITOT 1.0 0.9  PROT 6.4* 6.5  ALBUMIN 2.4* 2.3*    No results for input(s): "LIPASE", "AMYLASE" in the last 168 hours. No results for input(s): "AMMONIA" in the last 168 hours. Coagulation Profile: No results for input(s): "INR", "PROTIME"  in the last 168 hours. Cardiac Enzymes: No results for input(s): "CKTOTAL", "CKMB", "CKMBINDEX", "TROPONINI" in the last 168 hours. BNP (last 3 results) No results for input(s): "PROBNP" in the last 8760 hours. HbA1C: No results for input(s): "HGBA1C" in the last 72 hours. CBG: No results for input(s): "GLUCAP" in the last 168 hours. Lipid Profile: No results for input(s): "CHOL", "HDL", "LDLCALC", "TRIG", "CHOLHDL", "LDLDIRECT" in the last 72 hours. Thyroid Function Tests: Recent Labs    01/08/22 1255  TSH 0.885    Anemia Panel: Recent Labs    01/09/22 1507  VITAMINB12 923*  FOLATE >40.0  TIBC 267  IRON 33   Urine analysis:    Component Value Date/Time   COLORURINE YELLOW 01/08/2022 1600   APPEARANCEUR HAZY (A) 01/08/2022 1600   LABSPEC 1.004 (L) 01/08/2022 1600   PHURINE 7.0 01/08/2022 1600   GLUCOSEU NEGATIVE 01/08/2022 1600   GLUCOSEU NEGATIVE 01/17/2015 1415   HGBUR NEGATIVE 01/08/2022 1600   BILIRUBINUR NEGATIVE 01/08/2022 1600   KETONESUR NEGATIVE 01/08/2022 1600   PROTEINUR NEGATIVE 01/08/2022 1600   UROBILINOGEN 0.2 01/17/2015 1415   NITRITE NEGATIVE 01/08/2022 1600   LEUKOCYTESUR LARGE (A) 01/08/2022 1600   Sepsis Labs: _0 (procalcitonin:4,lacticidven:4)  ) Recent Results (from the past 240 hour(s))  Resp panel by RT-PCR (RSV, Flu A&B, Covid) Anterior Nasal Swab     Status: None   Collection Time: 01/08/22 12:16 PM   Specimen: Anterior Nasal Swab  Result Value Ref Range Status   SARS Coronavirus 2 by RT PCR NEGATIVE NEGATIVE Final    Comment: (NOTE) SARS-CoV-2 target nucleic acids are NOT DETECTED.  The SARS-CoV-2 RNA is generally detectable in upper respiratory specimens during the acute phase of infection. The lowest concentration of SARS-CoV-2 viral copies this assay can detect is 138 copies/mL. A negative result does not preclude SARS-Cov-2 infection and should not be used as the sole basis for treatment or other patient management  decisions. A negative result may occur with  improper specimen collection/handling, submission of specimen other than nasopharyngeal swab, presence of viral mutation(s) within the areas targeted by this assay, and inadequate number of viral copies(<138 copies/mL). A negative result must be combined with clinical observations, patient history, and epidemiological information. The expected result is Negative.  Fact Sheet for Patients:  EntrepreneurPulse.com.au  Fact Sheet for Healthcare Providers:  IncredibleEmployment.be  This test is no t yet approved or cleared by the [place] FDA and  has been authorized for detection and/or diagnosis of SARS-CoV-2 by FDA under an Emergency Use Authorization (EUA). This EUA will remain  in effect (meaning this test can be used)  for the duration of the COVID-19 declaration under Section 564(b)(1) of the Act, 21 U.S.C.section 360bbb-3(b)(1), unless the authorization is terminated  or revoked sooner.       Influenza A by PCR NEGATIVE NEGATIVE Final   Influenza B by PCR NEGATIVE NEGATIVE Final    Comment: (NOTE) The Xpert Xpress SARS-CoV-2/FLU/RSV plus assay is intended as an aid in the diagnosis of influenza from Nasopharyngeal swab specimens and should not be used as a sole basis for treatment. Nasal washings and aspirates are unacceptable for Xpert Xpress SARS-CoV-2/FLU/RSV testing.  Fact Sheet for Patients: EntrepreneurPulse.com.au  Fact Sheet for Healthcare Providers: IncredibleEmployment.be  This test is not yet approved or cleared by the [place] FDA and has been authorized for detection and/or diagnosis of SARS-CoV-2 by FDA under an Emergency Use Authorization (EUA). This EUA will remain in effect (meaning this test can be used) for the duration of the COVID-19 declaration under Section 564(b)(1) of the Act, 21 U.S.C. section 360bbb-3(b)(1), unless the  authorization is terminated or revoked.     Resp Syncytial Virus by PCR NEGATIVE NEGATIVE Final    Comment: (NOTE) Fact Sheet for Patients: EntrepreneurPulse.com.au  Fact Sheet for Healthcare Providers: IncredibleEmployment.be  This test is not yet approved or cleared by the [place] FDA and has been authorized for detection and/or diagnosis of SARS-CoV-2 by FDA under an Emergency Use Authorization (EUA). This EUA will remain in effect (meaning this test can be used) for the duration of the COVID-19 declaration under Section 564(b)(1) of the Act, 21 U.S.C. section 360bbb-3(b)(1), unless the authorization is terminated or revoked.  Performed at [facility], [place] [address], [place], [place] [address]   Urine Culture     Status: Abnormal (Preliminary result)   Collection Time: 01/08/22  5:36 PM   Specimen: Urine, Clean Catch  Result Value Ref Range Status   Specimen Description URINE, CLEAN CATCH  Final   Special Requests NONE  Final   Culture (A)  Final    >=100,000 COLONIES/mL PROTEUS MIRABILIS SUSCEPTIBILITIES TO FOLLOW Performed at [facility], [place] [address] [place], [place] [address]    Report Status PENDING  Incomplete  Blood culture (routine x 2)     Status: None (Preliminary result)   Collection Time: 01/08/22  5:39 PM   Specimen: BLOOD LEFT FOREARM  Result Value Ref Range Status   Specimen Description BLOOD LEFT FOREARM  Final   Special Requests   Final    BOTTLES DRAWN AEROBIC AND ANAEROBIC Blood Culture results may not be optimal due to an excessive volume of blood received in culture bottles   Culture   Final    NO GROWTH 2 DAYS Performed at [facility], [place] [address] [place], [place] [address]    Report Status PENDING  Incomplete  Culture, blood (Routine X 2) w Reflex to ID Panel     Status: None (Preliminary result)   Collection Time: 01/09/22  5:33 AM   Specimen: BLOOD LEFT ARM  Result Value  Ref Range Status   Specimen Description BLOOD LEFT ARM  Final   Special Requests   Final    BOTTLES DRAWN AEROBIC ONLY Blood Culture results may not be optimal due to an inadequate volume of blood received in culture bottles   Culture   Final    NO GROWTH 1 DAY Performed at [facility], [place] [address], [place], [place] [address]    Report Status PENDING  Incomplete  Radiology Studies: ECHOCARDIOGRAM COMPLETE  Result Date: 01/10/2022    ECHOCARDIOGRAM REPORT   Patient Name:   NATTALY YEBRA Date of Exam: 01/10/2022 Medical Rec #:  [id number]     Height:       65.0 in Accession #:    [id number]    Weight:       141.5 lb Date of Birth:  01-22-1925    BSA:          1.708 m Patient Age:    21 years      BP:           132/66 mmHg Patient Gender: F             HR:           79 bpm. Exam Location:  Inpatient Procedure: 2D Echo Indications:    CHF  History:        Patient has prior history of Echocardiogram examinations, most                 recent 05/07/2017. Arrythmias:Atrial Flutter; Risk                 Factors:Hypertension.  Sonographer:    Harvie Junior Referring Phys: [id number] Oretha Weismann I Aalaya Yadao  Sonographer Comments: Technically difficult study due to poor echo windows, suboptimal subcostal window and suboptimal apical window. IMPRESSIONS  1. Left ventricular ejection fraction, by estimation, is 60 to 65%. The left ventricle has normal function. The left ventricle has no regional wall motion abnormalities. Indeterminate diastolic filling due to E-A fusion.  2. Right ventricular systolic function is normal. The right ventricular size is not well visualized. There is moderately elevated pulmonary artery systolic pressure.  3. Left atrial size was severely dilated.  4. Right atrial size was severely dilated.  5. Trivial mitral valve regurgitation.  6. Tricuspid valve regurgitation is mild to moderate.  7. There is moderate calcification of the aortic valve. Aortic valve regurgitation is not  visualized. Aortic valve sclerosis/calcification is present, without any evidence of aortic stenosis.  8. The inferior vena cava is normal in size with greater than 50% respiratory variability, suggesting right atrial pressure of 3 mmHg. Comparison(s): No prior Echocardiogram. FINDINGS  Left Ventricle: Left ventricular ejection fraction, by estimation, is 60 to 65%. The left ventricle has normal function. The left ventricle has no regional wall motion abnormalities. The left ventricular internal cavity size was normal in size. There is  no left ventricular hypertrophy. Indeterminate diastolic filling due to E-A fusion. Right Ventricle: The right ventricular size is not well visualized. Right ventricular systolic function is normal. There is moderately elevated pulmonary artery systolic pressure. The tricuspid regurgitant velocity is 3.27 m/s, and with an assumed right atrial pressure of 3 mmHg, the estimated right ventricular systolic pressure is 23.3 mmHg. Left Atrium: Left atrial size was severely dilated. Right Atrium: Right atrial size was severely dilated. Pericardium: There is no evidence of pericardial effusion. Mitral Valve: Mild mitral annular calcification. Trivial mitral valve regurgitation. Tricuspid Valve: Tricuspid valve regurgitation is mild to moderate. Aortic Valve: There is moderate calcification of the aortic valve. Aortic valve regurgitation is not visualized. Aortic valve sclerosis/calcification is present, without any evidence of aortic stenosis. Aortic valve mean gradient measures 6.0 mmHg. Aortic valve peak gradient measures 11.8 mmHg. Aortic valve area, by VTI measures 1.02 cm. Pulmonic Valve: Pulmonic valve regurgitation is trivial. Aorta: The aortic root and ascending aorta are structurally normal, with no evidence of dilitation. Venous: The inferior vena cava  is normal in size with greater than 50% respiratory variability, suggesting right atrial pressure of 3 mmHg. IAS/Shunts: The  interatrial septum was not well visualized.  LEFT VENTRICLE PLAX 2D LVIDd:         3.40 cm     Diastology LVIDs:         2.40 cm     LV e' medial:    10.90 cm/s LV PW:         1.00 cm     LV E/e' medial:  11.2 LV IVS:        1.00 cm     LV e' lateral:   14.50 cm/s LVOT diam:     1.70 cm     LV E/e' lateral: 8.4 LV SV:         41 LV SV Index:   24 LVOT Area:     2.27 cm  LV Volumes (MOD) LV vol d, MOD A2C: 49.8 ml LV vol d, MOD A4C: 41.9 ml LV vol s, MOD A2C: 23.3 ml LV vol s, MOD A4C: 16.4 ml LV SV MOD A2C:     26.5 ml LV SV MOD A4C:     41.9 ml LV SV MOD BP:      25.1 ml RIGHT VENTRICLE RV S prime:     16.00 cm/s TAPSE (M-mode): 1.8 cm LEFT ATRIUM           Index        RIGHT ATRIUM           Index LA diam:      4.00 cm 2.34 cm/m   RA Area:     17.50 cm LA Vol (A4C): 97.8 ml 57.27 ml/m  RA Volume:   43.20 ml  25.30 ml/m  AORTIC VALVE                     PULMONIC VALVE AV Area (Vmax):    1.37 cm      PV Vmax:          1.10 m/s AV Area (Vmean):   1.28 cm      PV Peak grad:     4.8 mmHg AV Area (VTI):     1.02 cm      PR End Diast Vel: 5.02 msec AV Vmax:           172.00 cm/s AV Vmean:          117.000 cm/s AV VTI:            0.399 m AV Peak Grad:      11.8 mmHg AV Mean Grad:      6.0 mmHg LVOT Vmax:         104.00 cm/s LVOT Vmean:        65.800 cm/s LVOT VTI:          0.180 m LVOT/AV VTI ratio: 0.45  AORTA Ao Root diam: 3.20 cm MITRAL VALVE                TRICUSPID VALVE MV Area (PHT): 5.13 cm     TR Peak grad:   42.8 mmHg MV Decel Time: 148 msec     TR Vmax:        327.00 cm/s MR Peak grad: 98.8 mmHg MR Vmax:      497.00 cm/s   SHUNTS MV E velocity: 122.00 cm/s  Systemic VTI:  0.18 m MV A velocity: 34.00 cm/s   Systemic Diam: 1.70 cm MV E/A  ratio:  3.59 Phineas Inches Electronically signed by Phineas Inches Signature Date/Time: 01/10/2022/11:02:46 AM    Final         Scheduled Meds:  apixaban  5 mg Oral BID   diltiazem  120 mg Oral Daily   famotidine  10 mg Oral Daily   levothyroxine  25 mcg Oral QAC  breakfast   metoprolol succinate  25 mg Oral Daily   sodium chloride flush  3 mL Intravenous Q12H   Continuous Infusions:  cefTRIAXone (ROCEPHIN)  IV 2 g (01/10/22 1427)     LOS: 1 day    Time spent: 55 minutes.    Dana Allan, MD  [facility] Pager #: [phone] 7PM-7AM contact night coverage as above

## 2022-01-11 DIAGNOSIS — R531 Weakness: Secondary | ICD-10-CM | POA: Diagnosis not present

## 2022-01-11 LAB — RENAL FUNCTION PANEL
Albumin: 2.2 g/dL — ABNORMAL LOW (ref 3.5–5.0)
Anion gap: 9 (ref 5–15)
BUN: 11 mg/dL (ref 8–23)
CO2: 24 mmol/L (ref 22–32)
Calcium: 8.5 mg/dL — ABNORMAL LOW (ref 8.9–10.3)
Chloride: 105 mmol/L (ref 98–111)
Creatinine, Ser: 0.89 mg/dL (ref 0.44–1.00)
GFR, Estimated: >60 mL/min (ref >60)
Glucose, Bld: 87 mg/dL (ref 70–99)
Phosphorus: 3 mg/dL (ref 2.5–4.6)
Potassium: 3.3 mmol/L — ABNORMAL LOW (ref 3.5–5.1)
Sodium: 138 mmol/L (ref 135–145)

## 2022-01-11 LAB — CBC WITH DIFFERENTIAL/PLATELET
Abs Immature Granulocytes: 0.01 10*3/uL (ref 0.00–0.07)
Basophils Absolute: 0.1 10*3/uL (ref 0.0–0.1)
Basophils Relative: 1 %
Eosinophils Absolute: 0.4 10*3/uL (ref 0.0–0.5)
Eosinophils Relative: 8 %
HCT: 29.2 % — ABNORMAL LOW (ref 36.0–46.0)
Hemoglobin: 10.7 g/dL — ABNORMAL LOW (ref 12.0–15.0)
Immature Granulocytes: 0 %
Lymphocytes Relative: 20 %
Lymphs Abs: 1.1 10*3/uL (ref 0.7–4.0)
MCH: 39.2 pg — ABNORMAL HIGH (ref 26.0–34.0)
MCHC: 36.6 g/dL — ABNORMAL HIGH (ref 30.0–36.0)
MCV: 107 fL — ABNORMAL HIGH (ref 80.0–100.0)
Monocytes Absolute: 0.8 10*3/uL (ref 0.1–1.0)
Monocytes Relative: 15 %
Neutro Abs: 3.1 10*3/uL (ref 1.7–7.7)
Neutrophils Relative %: 56 %
Platelets: 319 10*3/uL (ref 150–400)
RBC: 2.73 MIL/uL — ABNORMAL LOW (ref 3.87–5.11)
RDW: 14.3 % (ref 11.5–15.5)
WBC: 5.5 10*3/uL (ref 4.0–10.5)
nRBC: 0 % (ref 0.0–0.2)

## 2022-01-11 LAB — URINE CULTURE: Culture: >=100000 — AB

## 2022-01-11 MED ORDER — ADULT MULTIVITAMIN W/MINERALS CH
1.0000 | ORAL_TABLET | Freq: Every day | ORAL | Status: DC
  Administered 2022-01-11 – 2022-01-14 (×4): 1 via ORAL
  Filled 2022-01-11 (×4): qty 1

## 2022-01-11 MED ORDER — POTASSIUM CHLORIDE 20 MEQ PO PACK
40.0000 meq | PACK | Freq: Once | ORAL | Status: AC
  Administered 2022-01-11: 40 meq via ORAL
  Filled 2022-01-11: qty 2

## 2022-01-11 MED ORDER — ENSURE ENLIVE PO LIQD
237.0000 mL | Freq: Two times a day (BID) | ORAL | Status: DC
  Administered 2022-01-12 – 2022-01-14 (×5): 237 mL via ORAL

## 2022-01-11 NOTE — Evaluation (Signed)
Clinical/Bedside Swallow Evaluation Patient Details  Name: NAYDEEN SPEIRS MRN: [id number] Date of Birth: [birth year]  Today's Date: 01/11/2022 Time: SLP Start Time (ACUTE ONLY): 1143 SLP Stop Time (ACUTE ONLY): 0630 SLP Time Calculation (min) (ACUTE ONLY): 32 min  Past Medical History:  Past Medical History:  Diagnosis Date   Anxiety    denies   Atrial flutter ([place])    Bilateral edema of lower extremity    Breast cancer ([place])    left breast   Chronic constipation    Chronic diastolic CHF (congestive heart failure) ([place])    Community acquired pneumonia of left lung    5/19 inpatient Rx   Diverticulosis of colon    GERD (gastroesophageal reflux disease)    H/O hiatal hernia    History of cellulitis    LEFT LOWER LEG --  SEPT 2015   History of colitis    DX  2003  WITH ISCHEMIC COLITIS   History of diverticulitis of colon    perferated diverticulitis w/ abscess 08-25-2013 drain placed --  resolved without surgical intervention   History of GI bleed    2011--- UPPER SECONARY GASTRIC ULCER FROM NSAID USE   History of Helicobacter pylori infection    2011   History of ulcer of lower limb    2012   RIGHT LOWER EXTREMITIY--  STATSIS ULCER   Hyperlipidemia    Hypertension    Hypothyroidism    Iron deficiency anemia 2011   elevated MCV   LBP (low back pain)    severe lumbar spondylosis s/p L3/L4,L4/L5 fusion 12/10   Lumbar spondylolysis    OA (osteoarthritis)    "hands" (08/25/2013)   Open wound of left lower leg    Osteopenia    Persistent atrial fibrillation ([place])    CARDIOLOGIST-   DR MCALANY   RBBB    Urinary frequency    Past Surgical History:  Past Surgical History:  Procedure Laterality Date   APPLICATION OF A-CELL OF EXTREMITY Left 06/21/2013   Procedure: APPLICATION OF A-CELL OF EXTREMITY;  Surgeon: Theodoro Kos, DO;  Location: [place];  Service: Plastics;  Laterality: Left;   APPLICATION OF A-CELL OF EXTREMITY Left 08/22/2013   Procedure: APPLICATION OF A-CELL/VAC  TO LOWER LEFT LEG WOUND;  Surgeon: Theodoro Kos, DO;  Location: [place];  Service: Plastics;  Laterality: Left;   APPLICATION OF A-CELL OF EXTREMITY Left 02/06/2014   Procedure: WITH PLACEMENT OF A -CELL ;  Surgeon: Theodoro Kos, DO;  Location: [place];  Service: Plastics;  Laterality: Left;   APPLICATION OF A-CELL OF EXTREMITY Left 08/09/2014   Procedure: PLACEMENT OF A CELL;  Surgeon: Theodoro Kos, DO;  Location: [place];  Service: Plastics;  Laterality: Left;   APPLICATION OF WOUND VAC Left 06/16/2013   Procedure: APPLICATION OF WOUND VAC;  Surgeon: Meredith Pel, MD;  Location: [place];  Service: Orthopedics;  Laterality: Left;   APPLICATION OF WOUND VAC Left 06/21/2013   Procedure: APPLICATION OF WOUND VAC;  Surgeon: Theodoro Kos, DO;  Location: [place];  Service: Plastics;  Laterality: Left;   BIOPSY BREAST  07/25/2015   LEFT RADIOACTIVE SEED GUIDED EXCISIONAL BREAST BIOPSY (Left) as a surgical intervention   EXTERNAL FIXATION LEG Left 06/16/2013   Procedure: EXTERNAL FIXATION LEG;  Surgeon: Meredith Pel, MD;  Location: [place];  Service: Orthopedics;  Laterality: Left;   EXTERNAL FIXATION REMOVAL Left 06/21/2013   Procedure: REMOVAL EXTERNAL FIXATION LEG;  Surgeon: Meredith Pel, MD;  Location: [place];  Service: Orthopedics;  Laterality: Left;   EYE SURGERY Bilateral    cataract removal   HARDWARE REMOVAL Left 05/23/2014   Procedure: HARDWARE REMOVAL;  Surgeon: Meredith Pel, MD;  Location: [place];  Service: Orthopedics;  Laterality: Left;  REMOVAL OF HARDWARE LEFT TIBIA   I & D EXTREMITY Left 06/16/2013   Procedure: IRRIGATION AND DEBRIDEMENT EXTREMITY;  Surgeon: Meredith Pel, MD;  Location: [place];  Service: Orthopedics;  Laterality: Left;   I & D EXTREMITY Left 02/06/2014   Procedure: IRRIGATION AND DEBRIDEMENT LEFT LEG WOUND ;  Surgeon: Theodoro Kos, DO;  Location: [place];  Service: Plastics;  Laterality: Left;   I &  D EXTREMITY Left 08/09/2014   Procedure: IRRIGATION AND DEBRIDEMENT  OF LEFT LOWER LEG;  Surgeon: Theodoro Kos, DO;  Location: [place];  Service: Plastics;  Laterality: Left;   LUMBAR FUSION  01-02-2009   L3-L5   RADIOACTIVE SEED GUIDED EXCISIONAL BREAST BIOPSY Left 07/25/2015   Procedure: LEFT RADIOACTIVE SEED GUIDED EXCISIONAL BREAST BIOPSY;  Surgeon: Rolm Bookbinder, MD;  Location: [place];  Service: General;  Laterality: Left;   RE-EXCISION OF BREAST LUMPECTOMY Left 09/05/2015   Procedure: RE-EXCISION OF LEFT BREAST LUMPECTOMY;  Surgeon: Rolm Bookbinder, MD;  Location: [place];  Service: General;  Laterality: Left;   SHOULDER OPEN ROTATOR CUFF REPAIR Left 1997   TIBIA IM NAIL INSERTION Left 06/21/2013   Procedure: INTRAMEDULLARY (IM) NAIL TIBIAL;  Surgeon: Meredith Pel, MD;  Location: [place];  Service: Orthopedics;  Laterality: Left;   TONSILLECTOMY  AS CHILD   TOTAL HIP ARTHROPLASTY Right 06-07-2010   TRANSTHORACIC ECHOCARDIOGRAM  09-11-2010   DR MCALHANY   LVSF  55-60%/  MILD MV CALCIFICATION WITH NO STENOSIS/  MILD MR & TR / MODERATE LAE/  MODERATE TO SEVERE RAE   UPPER GI ENDOSCOPY  03-29-2010   HPI:  BRAE SCHAAFSMA is a 88 y.o. female who presented with generalized weakness. She had decreased PO intake at [facility]). Found to have UTI.  Pt with now resolving AMS.  CXR 12/20 with no acute findings.   Pt with medical history significant of memory loss, skin cancer, peptic ulcer disease, anemia, hypothyroidism, hypertension, diverticulosis, CKD 3, venous insufficiency, atrial fibrillation, diastolic CHF.    Assessment / Plan / Recommendation  Clinical Impression  Pt presents with functional swallowing as assessed clinically.  Pt denies hx of dysphagia. CXR on admission with no acute findings.  Pt tolerated all consistencies trialed today with no clinical s/s of aspiration and exhibited good, independent oral clearance of all solid textures.  Pt has no swallowing needs. SLP will sign  off at this time.    Recommend continuing regular texture diet with thin liquids.      OF NOTE:  Pt is understandably very emotional about her current situation.  SLP sat with patient for half an hour and provided emotional support to the best of her ability. Pt stated 2 or 3 times during this session that she would like to die.  She says she has everything prepared already.  She is devastated about possible transition to assisted living within [place]. She feels she is being railroaded by her daughter who is making decisions for her without her input, from the pt's perspective.  Pt is showing signs of depression and states she has felt this way for more than two weeks, not just with this current admission.  Pt notes that she is 52 and has  had a long life.  She has chronic medical issues.  Consider psych consult for feelings of depression.  It might also be worth considering palliative consult for quality of life consider her multiple medical issues.  If case workers are available, consider ongoing conversations with CSW regarding discharge plan of care to get pt's acceptance and/or consider other options.  Pt could very much use additional support at this time.     SLP Visit Diagnosis: Dysphagia, unspecified (R13.10)    Aspiration Risk  No limitations    Diet Recommendation Regular;Thin liquid   Liquid Administration via: Cup;Straw Medication Administration: Whole meds with liquid Supervision: Patient able to self feed (assist PRN) Compensations: Slow rate;Small sips/bites Postural Changes: Seated upright at 90 degrees    Other  Recommendations Oral Care Recommendations: Oral care BID    Recommendations for follow up therapy are one component of a multi-disciplinary discharge planning process, led by the attending physician.  Recommendations may be updated based on patient status, additional functional criteria and insurance authorization.  Follow up Recommendations No SLP follow up       Assistance Recommended at Discharge    Functional Status Assessment Patient has not had a recent decline in their functional status  Frequency and Duration  (N/A)          Prognosis Prognosis for Safe Diet Advancement:  (N/A)      Swallow Study   General Date of Onset: 01/08/22 HPI: BAILEY FAIELLA is a 88 y.o. female who presented with generalized weakness. She had decreased PO intake at [facility]). Found to have UTI.  Pt with now resolving AMS.  CXR 12/20 with no acute findings.   Pt with medical history significant of memory loss, skin cancer, peptic ulcer disease, anemia, hypothyroidism, hypertension, diverticulosis, CKD 3, venous insufficiency, atrial fibrillation, diastolic CHF. Type of Study: Bedside Swallow Evaluation Previous Swallow Assessment: none Diet Prior to this Study: Regular;Thin liquids History of Recent Intubation: No Behavior/Cognition: Alert;Cooperative;Pleasant mood Oral Cavity Assessment: Within Functional Limits Oral Care Completed by SLP: No Oral Cavity - Dentition: Adequate natural dentition Vision: Functional for self-feeding Patient Positioning: Upright in bed Baseline Vocal Quality: Normal Volitional Cough: Strong Volitional Swallow: Able to elicit    Oral/Motor/Sensory Function Overall Oral Motor/Sensory Function: Within functional limits Facial ROM: Within Functional Limits Facial Symmetry: Within Functional Limits Lingual ROM: Within Functional Limits Lingual Symmetry: Within Functional Limits Lingual Strength: Within Functional Limits Velum: Within Functional Limits Mandible: Within Functional Limits   Ice Chips Ice chips: Not tested   Thin Liquid Thin Liquid: Within functional limits    Nectar Thick Nectar Thick Liquid: Not tested   Honey Thick Honey Thick Liquid: Not tested   Puree Puree: Within functional limits   Solid     Solid: Within functional limits      Celedonio Savage, [place], [place] Office: [phone] 01/11/2022,1:00 PM

## 2022-01-11 NOTE — Progress Notes (Signed)
Notified Dr. Marthenia Rolling of the below situation.  Misty the chaplain at [facility] where the patient lives in independent living just came to the desk. She wanted to let me know that the patient is very depressed and has stated that she wants to die. I did witness the patient crying this morning but she stated it was because her daughter in [place] wasn't looking out for her. Misty stated that the daughter doesn't think the patient is capable of going back to independent living and that she needs to be in the assisted living program at [facility]. Misty advised the patient doesn't understand that the independent living is what has brought her into the hospital.

## 2022-01-11 NOTE — Progress Notes (Signed)
PROGRESS NOTE    [name]  YYT:[id number] DOB: 25-Jan-1925 DOA: 01/08/2022 PCP: [name], MD  Outpatient Specialists:     Brief Narrative:  As per H&P done on admission: "[name] is a 88 y.o. female with medical history significant of memory loss, skin cancer, peptic ulcer disease, anemia, hypothyroidism, hypertension, diverticulosis, CKD 3, venous insufficiency, atrial fibrillation, diastolic CHF presenting with generalized weakness.   Patient had been fairly independent at her independent living facility but for the past several days she has had significant worsening fatigue and generalized weakness.  Also has had decreased p.o. intake in addition to staying her bed most of the day in the last couple days.  She does live alone at this facility but get some home health 3 times a week.   She has chronic lower extremity ulcers from her venous stasis and recently started treatment for possible cellulitis about a week ago.  Started on a Omadacyclin.  She denies fevers, chills, chest pain, shortness breath, abdominal pain, constipation, diarrhea, nausea, vomiting.   ED Course: Vital signs in the ED significant for blood pressure in the 275T to 700F systolic, respiratory rate in the teens to 20s.  Lab workup included CMP with creatinine near baseline at 1.18, glucose 110, calcium 8.8, protein 6.4, albumin 2.4.  CBC with anemia stable 10.8.  BNP mildly elevated at 504.  Troponin initially mildly elevated at 22 but normal on repeat.  TSH normal.  Rester panel for flu and COVID-negative.  Urinalysis with leukocytes and bacteria.  Urine culture and blood culture pending.  Magnesium and phosphorus normal.  Chest x-ray with mild cardiomegaly but no acute normality.  Patient received dose of ceftriaxone, 1.5 L of IV fluids in the ED".  01/09/2022: Patient seen.  Patient is not a particularly good historian.  Patient continues to report feeling hot.  Temperature was rechecked and came  back normal.  Patient reports feeling unwell, but could not elaborate further.  01/10/2022: Patient seen.  Patient is slowly improving, but remains encephalopathic.  Urine cultures growing Proteus mirabilis.  Albumin is 2.2.  Continue IV ceftriaxone.  Follow final urine cultures.  Consult PT OT.  Dietary consult.  Calorie count.  Further management depend on hospital course.  TOC input to be highly appreciated.  01/11/2022: Patient seen.  Patient is a lot better today.  Urine culture grew Proteus mirabilis that is sensitive.  Continue Rocephin.  Encephalopathy seems to be resolving.  Physical therapy and Occupational Therapy input is appreciated.  Hopefully, patient will be discharged in the next 1 to 2 days.  Kidney injury has resolved.  Potassium is noted to be 3.3 today, will replete.   Assessment & Plan:   Principal Problem:   Generalized weakness Active Problems:   Hypothyroidism   Iron deficiency anemia   Essential hypertension   Atrial flutter (HCC)   Peptic ulcer   UTI (urinary tract infection)   Memory loss   Skin cancer   CKD (chronic kidney disease) stage 3, GFR 30-59 ml/min (HCC)   FTT (failure to thrive) in adult   Failure to thrive/generalized weakness UTI > Patient presenting with 3 days of worsening weakness/fatigue.  Also with decreased p.o. intake. > Reportedly has been living fairly independently at independent living facility but with his fatigue she has had trouble even getting up out of bed.  She only has home health 3 times a week. > Lab workup largely reassuring in the ED.  Did have evidence of possible  UTI with leukocytes and bacteria in her urine with no other explanation she has been started on ceftriaxone. > Of note, she has been started on a tetracycline week ago for concern for cellulitis of the lower extremities which at this time appears to be stable/improving from outpatient visit (though less likely, if urine culture is negative, this medication could  cause fatigue). - Monitor on MedSurg unit overnight - Continue with ceftriaxone - Follow-up urine culture and blood cultures - Trend fever curve and WBC - PT/OT eval and treat 01/09/2022: Follow cultures.  Supportive care.  Pursue echocardiogram.  Follow PT OT recommendations.  Admit patient to inpatient, telemetry monitoring. 01/10/2022: Urine culture is growing Proteus mirabilis.  Follow final urine culture.  Continue IV ceftriaxone.  Patient is slowly improving.  PT OT consult.  Dietary consult.  Caloric count. 01/11/2022: Patient is improving.  Continue antibiotics for now.  Likely discharge in 1 to 2 days, if patient continues to improve.  Encephalopathy, combined, toxic and metabolic: -Urine culture is growing Proteus. -Acute kidney injury slowly resolving. -Patient is slowly improving. 01/11/2022: Patient continues to improve.   Venous stasis Venous stasis ulcers Recent cellulitis > Known history of venous stasis and subsequent ulceration that she follows with wound care for.  Concern for recent cellulitis and started on a Omadacycline outpatient.  This appears to be stable/improving.  No leukocytosis. - Holding outpatient antibiotic, as we do not have in the hospital and she did not bring her supply with her - Will be on ceftriaxone and this has similar coverage to outpatient antibiotic - Trend fever curve and WBC - PT/OT eval and treat - WOC consulted in the ED   PUD - Continue home Pepcid   Anemia -Hemoglobin is 11.2 g/dL today. -MCV is elevated at 104.5. -B12 and folate are normal. -Possibly secondary to myelodysplastic syndrome.   Hypothyroidism - Continue home Synthroid   Hypertension - Continue home diltiazem and metoprolol   Acute kidney injury: -Creatinine today is 1.11 with EGFR of 46 mL/min per 1.73 m. -Continue to monitor renal function and electrolytes -Avoid nephrotoxins. -Keep MAP greater than 65 mmHg. 01/10/2022: Renal function continues to  improve.  Renal panel in the morning. 01/11/2022: AKI has resolved.   Atrial fibrillation - Continue home diltiazem, metoprolol, Eliquis   Diastolic CHF > Chronic history of this on Lasix twice daily.  Last echo was in 2019 with EF 55%, indeterminate diastolic function. -Repeat echocardiogram. - Holding Lasix in the setting of decreased p.o. intake/mild dehydration - Continue home metoprolol  Volume depletion: -See above. -Lasix is on hold. 01/11/2022: Resolved significantly     DVT prophylaxis: Eliquis Code Status: DNR/DNI Family Communication:  Disposition Plan:    Consultants:  None  Procedures:    Antimicrobials:  IV Rocephin.   Subjective: -Patient has continued to improve.  Objective: Vitals:   01/10/22 0500 01/10/22 1519 01/10/22 2126 01/11/22 0623  BP:  (!) 124/51 (!) 151/89 (!) 139/95  Pulse:  76 79 77  Resp:  _0 Temp:  98 F (36.7 C) 98.1 F (36.7 C) 98 F (36.7 C)  TempSrc:  Oral Oral Oral  SpO2:  99% 96% 97%  Weight: 64.2 kg     Height:        Intake/Output Summary (Last 24 hours) at 01/11/2022 1121 Last data filed at 01/11/2022 0347 Gross per 24 hour  Intake 480 ml  Output 550 ml  Net -70 ml    Filed Weights   01/08/22 2346 01/10/22  0500  Weight: 67.7 kg 64.2 kg    Examination:  General exam: Appears calm and comfortable.  Patient is pale. Respiratory system: Clear to auscultation.  Cardiovascular system: S1 & S2 heard Gastrointestinal system: Abdomen is soft and nontender.  Central nervous system: Alert and oriented. Patient improves all extremities. Extremities: Both lower legs are wrapped.  Data Reviewed: I have personally reviewed following labs and imaging studies  CBC: Recent Labs  Lab 01/08/22 1255 01/09/22 0533 01/11/22 0312  WBC 7.1 8.4 5.5  NEUTROABS  --   --  3.1  HGB 10.8* 11.2* 10.7*  HCT 29.3* 30.4* 29.2*  MCV 108.1* 104.5* 107.0*  PLT 322 314 939    Basic Metabolic Panel: Recent Labs  Lab  01/08/22 1255 01/09/22 0533 01/11/22 0312  NA 136 135 138  K 4.2 3.6 3.3*  CL 98 100 105  CO2 _0 GLUCOSE 110* 101* 87  BUN _1 CREATININE 1.18* 1.11* 0.89  CALCIUM 8.8* 8.6* 8.5*  MG 1.8  --   --   PHOS 3.2  --  3.0    GFR: Estimated Creatinine Clearance: 34.8 mL/min (by C-G formula based on SCr of 0.89 mg/dL). Liver Function Tests: Recent Labs  Lab 01/08/22 1255 01/09/22 0533 01/11/22 0312  AST 33 32  --   ALT 17 17  --   ALKPHOS 61 62  --   BILITOT 1.0 0.9  --   PROT 6.4* 6.5  --   ALBUMIN 2.4* 2.3* 2.2*    No results for input(s): "LIPASE", "AMYLASE" in the last 168 hours. No results for input(s): "AMMONIA" in the last 168 hours. Coagulation Profile: No results for input(s): "INR", "PROTIME" in the last 168 hours. Cardiac Enzymes: No results for input(s): "CKTOTAL", "CKMB", "CKMBINDEX", "TROPONINI" in the last 168 hours. BNP (last 3 results) No results for input(s): "PROBNP" in the last 8760 hours. HbA1C: No results for input(s): "HGBA1C" in the last 72 hours. CBG: No results for input(s): "GLUCAP" in the last 168 hours. Lipid Profile: No results for input(s): "CHOL", "HDL", "LDLCALC", "TRIG", "CHOLHDL", "LDLDIRECT" in the last 72 hours. Thyroid Function Tests: Recent Labs    01/08/22 1255  TSH 0.885    Anemia Panel: Recent Labs    01/09/22 1507  VITAMINB12 923*  FOLATE >40.0  TIBC 267  IRON 33    Urine analysis:    Component Value Date/Time   COLORURINE YELLOW 01/08/2022 1600   APPEARANCEUR HAZY (A) 01/08/2022 1600   LABSPEC 1.004 (L) 01/08/2022 1600   PHURINE 7.0 01/08/2022 1600   GLUCOSEU NEGATIVE 01/08/2022 1600   GLUCOSEU NEGATIVE 01/17/2015 1415   HGBUR NEGATIVE 01/08/2022 1600   BILIRUBINUR NEGATIVE 01/08/2022 1600   KETONESUR NEGATIVE 01/08/2022 1600   PROTEINUR NEGATIVE 01/08/2022 1600   UROBILINOGEN 0.2 01/17/2015 1415   NITRITE NEGATIVE 01/08/2022 1600   LEUKOCYTESUR LARGE (A) 01/08/2022 1600   Sepsis  Labs: _2 (procalcitonin:4,lacticidven:4)  ) Recent Results (from the past 240 hour(s))  Resp panel by RT-PCR (RSV, Flu A&B, Covid) Anterior Nasal Swab     Status: None   Collection Time: 01/08/22 12:16 PM   Specimen: Anterior Nasal Swab  Result Value Ref Range Status   SARS Coronavirus 2 by RT PCR NEGATIVE NEGATIVE Final    Comment: (NOTE) SARS-CoV-2 target nucleic acids are NOT DETECTED.  The SARS-CoV-2 RNA is generally detectable in upper respiratory specimens during the acute phase of infection. The lowest concentration of SARS-CoV-2 viral copies this assay can detect is 138 copies/mL.  A negative result does not preclude SARS-Cov-2 infection and should not be used as the sole basis for treatment or other patient management decisions. A negative result may occur with  improper specimen collection/handling, submission of specimen other than nasopharyngeal swab, presence of viral mutation(s) within the areas targeted by this assay, and inadequate number of viral copies(<138 copies/mL). A negative result must be combined with clinical observations, patient history, and epidemiological information. The expected result is Negative.  Fact Sheet for Patients:  EntrepreneurPulse.com.au  Fact Sheet for Healthcare Providers:  IncredibleEmployment.be  This test is no t yet approved or cleared by the [place] FDA and  has been authorized for detection and/or diagnosis of SARS-CoV-2 by FDA under an Emergency Use Authorization (EUA). This EUA will remain  in effect (meaning this test can be used) for the duration of the COVID-19 declaration under Section 564(b)(1) of the Act, 21 U.S.C.section 360bbb-3(b)(1), unless the authorization is terminated  or revoked sooner.       Influenza A by PCR NEGATIVE NEGATIVE Final   Influenza B by PCR NEGATIVE NEGATIVE Final    Comment: (NOTE) The Xpert Xpress SARS-CoV-2/FLU/RSV plus assay is intended as  an aid in the diagnosis of influenza from Nasopharyngeal swab specimens and should not be used as a sole basis for treatment. Nasal washings and aspirates are unacceptable for Xpert Xpress SARS-CoV-2/FLU/RSV testing.  Fact Sheet for Patients: EntrepreneurPulse.com.au  Fact Sheet for Healthcare Providers: IncredibleEmployment.be  This test is not yet approved or cleared by the [place] FDA and has been authorized for detection and/or diagnosis of SARS-CoV-2 by FDA under an Emergency Use Authorization (EUA). This EUA will remain in effect (meaning this test can be used) for the duration of the COVID-19 declaration under Section 564(b)(1) of the Act, 21 U.S.C. section 360bbb-3(b)(1), unless the authorization is terminated or revoked.     Resp Syncytial Virus by PCR NEGATIVE NEGATIVE Final    Comment: (NOTE) Fact Sheet for Patients: EntrepreneurPulse.com.au  Fact Sheet for Healthcare Providers: IncredibleEmployment.be  This test is not yet approved or cleared by the [place] FDA and has been authorized for detection and/or diagnosis of SARS-CoV-2 by FDA under an Emergency Use Authorization (EUA). This EUA will remain in effect (meaning this test can be used) for the duration of the COVID-19 declaration under Section 564(b)(1) of the Act, 21 U.S.C. section 360bbb-3(b)(1), unless the authorization is terminated or revoked.  Performed at [facility], [place] [address] [place], [place] [address]   Urine Culture     Status: Abnormal   Collection Time: 01/08/22  5:36 PM   Specimen: Urine, Clean Catch  Result Value Ref Range Status   Specimen Description URINE, CLEAN CATCH  Final   Special Requests   Final    NONE Performed at [facility], [place] [address], [place], [place] [address]    Culture >=100,000 COLONIES/mL PROTEUS MIRABILIS (A)  Final   Report Status 01/11/2022 FINAL  Final    Organism ID, Bacteria PROTEUS MIRABILIS (A)  Final      Susceptibility   Proteus mirabilis - MIC*    AMPICILLIN <=2 SENSITIVE Sensitive     CEFAZOLIN 8 SENSITIVE Sensitive     CEFEPIME <=0.12 SENSITIVE Sensitive     CEFTRIAXONE <=0.25 SENSITIVE Sensitive     CIPROFLOXACIN <=0.25 SENSITIVE Sensitive     GENTAMICIN <=1 SENSITIVE Sensitive     IMIPENEM 2 SENSITIVE Sensitive     NITROFURANTOIN 128 RESISTANT Resistant     TRIMETH/SULFA <=  20 SENSITIVE Sensitive     AMPICILLIN/SULBACTAM <=2 SENSITIVE Sensitive     PIP/TAZO <=4 SENSITIVE Sensitive     * >=100,000 COLONIES/mL PROTEUS MIRABILIS  Blood culture (routine x 2)     Status: None (Preliminary result)   Collection Time: 01/08/22  5:39 PM   Specimen: BLOOD LEFT FOREARM  Result Value Ref Range Status   Specimen Description BLOOD LEFT FOREARM  Final   Special Requests   Final    BOTTLES DRAWN AEROBIC AND ANAEROBIC Blood Culture results may not be optimal due to an excessive volume of blood received in culture bottles   Culture   Final    NO GROWTH 3 DAYS Performed at [facility], [place] [address] [place], [place] [address]    Report Status PENDING  Incomplete  Culture, blood (Routine X 2) w Reflex to ID Panel     Status: None (Preliminary result)   Collection Time: 01/09/22  5:33 AM   Specimen: BLOOD LEFT ARM  Result Value Ref Range Status   Specimen Description BLOOD LEFT ARM  Final   Special Requests   Final    BOTTLES DRAWN AEROBIC ONLY Blood Culture results may not be optimal due to an inadequate volume of blood received in culture bottles   Culture   Final    NO GROWTH 2 DAYS Performed at [facility], [place] [address] [place], [place] [address]    Report Status PENDING  Incomplete         Radiology Studies: ECHOCARDIOGRAM COMPLETE  Result Date: 01/10/2022    ECHOCARDIOGRAM REPORT   Patient Name:   [name] Date of Exam: 01/10/2022 Medical Rec #:  [id number]     Height:       65.0 in Accession #:     [id number]    Weight:       141.5 lb Date of Birth:  05/21/1925    BSA:          1.708 m Patient Age:    51 years      BP:           132/66 mmHg Patient Gender: F             HR:           79 bpm. Exam Location:  Inpatient Procedure: 2D Echo Indications:    CHF  History:        Patient has prior history of Echocardiogram examinations, most                 recent 05/07/2017. Arrythmias:Atrial Flutter; Risk                 Factors:Hypertension.  Sonographer:    Harvie Junior Referring Phys: [id number] Gustie Bobb I Anderson Coppock  Sonographer Comments: Technically difficult study due to poor echo windows, suboptimal subcostal window and suboptimal apical window. IMPRESSIONS  1. Left ventricular ejection fraction, by estimation, is 60 to 65%. The left ventricle has normal function. The left ventricle has no regional wall motion abnormalities. Indeterminate diastolic filling due to E-A fusion.  2. Right ventricular systolic function is normal. The right ventricular size is not well visualized. There is moderately elevated pulmonary artery systolic pressure.  3. Left atrial size was severely dilated.  4. Right atrial size was severely dilated.  5. Trivial mitral valve regurgitation.  6. Tricuspid valve regurgitation is mild to moderate.  7. There is moderate calcification of the aortic valve. Aortic valve regurgitation is not visualized.  Aortic valve sclerosis/calcification is present, without any evidence of aortic stenosis.  8. The inferior vena cava is normal in size with greater than 50% respiratory variability, suggesting right atrial pressure of 3 mmHg. Comparison(s): No prior Echocardiogram. FINDINGS  Left Ventricle: Left ventricular ejection fraction, by estimation, is 60 to 65%. The left ventricle has normal function. The left ventricle has no regional wall motion abnormalities. The left ventricular internal cavity size was normal in size. There is  no left ventricular hypertrophy. Indeterminate diastolic filling due to E-A  fusion. Right Ventricle: The right ventricular size is not well visualized. Right ventricular systolic function is normal. There is moderately elevated pulmonary artery systolic pressure. The tricuspid regurgitant velocity is 3.27 m/s, and with an assumed right atrial pressure of 3 mmHg, the estimated right ventricular systolic pressure is 10.9 mmHg. Left Atrium: Left atrial size was severely dilated. Right Atrium: Right atrial size was severely dilated. Pericardium: There is no evidence of pericardial effusion. Mitral Valve: Mild mitral annular calcification. Trivial mitral valve regurgitation. Tricuspid Valve: Tricuspid valve regurgitation is mild to moderate. Aortic Valve: There is moderate calcification of the aortic valve. Aortic valve regurgitation is not visualized. Aortic valve sclerosis/calcification is present, without any evidence of aortic stenosis. Aortic valve mean gradient measures 6.0 mmHg. Aortic valve peak gradient measures 11.8 mmHg. Aortic valve area, by VTI measures 1.02 cm. Pulmonic Valve: Pulmonic valve regurgitation is trivial. Aorta: The aortic root and ascending aorta are structurally normal, with no evidence of dilitation. Venous: The inferior vena cava is normal in size with greater than 50% respiratory variability, suggesting right atrial pressure of 3 mmHg. IAS/Shunts: The interatrial septum was not well visualized.  LEFT VENTRICLE PLAX 2D LVIDd:         3.40 cm     Diastology LVIDs:         2.40 cm     LV e' medial:    10.90 cm/s LV PW:         1.00 cm     LV E/e' medial:  11.2 LV IVS:        1.00 cm     LV e' lateral:   14.50 cm/s LVOT diam:     1.70 cm     LV E/e' lateral: 8.4 LV SV:         41 LV SV Index:   24 LVOT Area:     2.27 cm  LV Volumes (MOD) LV vol d, MOD A2C: 49.8 ml LV vol d, MOD A4C: 41.9 ml LV vol s, MOD A2C: 23.3 ml LV vol s, MOD A4C: 16.4 ml LV SV MOD A2C:     26.5 ml LV SV MOD A4C:     41.9 ml LV SV MOD BP:      25.1 ml RIGHT VENTRICLE RV S prime:     16.00 cm/s  TAPSE (M-mode): 1.8 cm LEFT ATRIUM           Index        RIGHT ATRIUM           Index LA diam:      4.00 cm 2.34 cm/m   RA Area:     17.50 cm LA Vol (A4C): 97.8 ml 57.27 ml/m  RA Volume:   43.20 ml  25.30 ml/m  AORTIC VALVE                     PULMONIC VALVE AV Area (Vmax):    1.37 cm  PV Vmax:          1.10 m/s AV Area (Vmean):   1.28 cm      PV Peak grad:     4.8 mmHg AV Area (VTI):     1.02 cm      PR End Diast Vel: 5.02 msec AV Vmax:           172.00 cm/s AV Vmean:          117.000 cm/s AV VTI:            0.399 m AV Peak Grad:      11.8 mmHg AV Mean Grad:      6.0 mmHg LVOT Vmax:         104.00 cm/s LVOT Vmean:        65.800 cm/s LVOT VTI:          0.180 m LVOT/AV VTI ratio: 0.45  AORTA Ao Root diam: 3.20 cm MITRAL VALVE                TRICUSPID VALVE MV Area (PHT): 5.13 cm     TR Peak grad:   42.8 mmHg MV Decel Time: 148 msec     TR Vmax:        327.00 cm/s MR Peak grad: 98.8 mmHg MR Vmax:      497.00 cm/s   SHUNTS MV E velocity: 122.00 cm/s  Systemic VTI:  0.18 m MV A velocity: 34.00 cm/s   Systemic Diam: 1.70 cm MV E/A ratio:  3.59 Landscape architect signed by Phineas Inches Signature Date/Time: 01/10/2022/11:02:46 AM    Final         Scheduled Meds:  apixaban  5 mg Oral BID   diltiazem  120 mg Oral Daily   famotidine  10 mg Oral Daily   levothyroxine  25 mcg Oral QAC breakfast   metoprolol succinate  25 mg Oral Daily   potassium chloride  40 mEq Oral Once   sodium chloride flush  3 mL Intravenous Q12H   Continuous Infusions:  cefTRIAXone (ROCEPHIN)  IV 2 g (01/10/22 1427)     LOS: 2 days    Time spent: 35 minutes.    Dana Allan, MD  [facility] Pager #: [phone] 7PM-7AM contact night coverage as above

## 2022-01-11 NOTE — Plan of Care (Signed)

## 2022-01-11 NOTE — Progress Notes (Signed)
Occupational Therapy Treatment Patient Details Name: [name] MRN: [id number] DOB: 01-26-1925 Today's Date: 01/11/2022   History of present illness 88 yo female admitted 12/20 with weakness and UTI from [place]. PMhx: Afib, breast CA, GERD, colitis, HTn, HLD, CHF   OT comments  Pt seen due to new OT orders. Pt with decreased mood state this date and tearful throughout session, requiring encouragement to get off of soiled sheets on arrival. Pt perseverating on conversations that have been had with her daughter and believes her daughter is being spiteful for wanting her to go to ALF. Pt did require light lifting assist this session to rise into standing, but hopeful to continue progress when mood state is elevated to discharge to venue listed below. Pt cleaned and returned to supine at end of session. Engaging pt in deep breathing exercises and providing emotional support throughout.    Recommendations for follow up therapy are one component of a multi-disciplinary discharge planning process, led by the attending physician.  Recommendations may be updated based on patient status, additional functional criteria and insurance authorization.    Follow Up Recommendations  Other (comment) (HHOT dependent upon progress; If unable to provide light assist at ALF, may need SNF prior to transition)     Assistance Recommended at Discharge Intermittent Supervision/Assistance  Patient can return home with the following  A little help with walking and/or transfers;A little help with bathing/dressing/bathroom;Assist for transportation;Help with stairs or ramp for entrance   Equipment Recommendations  None recommended by OT    Recommendations for Other Services      Precautions / Restrictions Precautions Precautions: Fall Restrictions Weight Bearing Restrictions: No       Mobility Bed Mobility Overal bed mobility: Needs Assistance Bed Mobility: Sit to Supine, Supine to Sit      Supine to sit: Min assist Sit to supine: Min assist        Transfers Overall transfer level: Needs assistance Equipment used: Rolling walker (2 wheels) Transfers: Sit to/from Stand Sit to Stand: Min assist           General transfer comment: Min Assist for rise     Balance Overall balance assessment: Needs assistance Sitting-balance support: Feet supported Sitting balance-Leahy Scale: Good     Standing balance support: Reliant on assistive device for balance Standing balance-Leahy Scale: Poor                             ADL either performed or assessed with clinical judgement   ADL Overall ADL's : Needs assistance/impaired                 Upper Body Dressing : Set up;Bed level Upper Body Dressing Details (indicate cue type and reason): donning new gown     Toilet Transfer: Stand-pivot;BSC/3in1;Minimal assistance           Functional mobility during ADLs: Minimal assistance General ADL Comments: Pt reluctant to go further than standing EOB today, reporting she is just so weak she cannot get better. VEry emotionally labile    Extremity/Trunk Assessment Upper Extremity Assessment Upper Extremity Assessment: Generalized weakness   Lower Extremity Assessment Lower Extremity Assessment: Defer to PT evaluation        Vision       Perception     Praxis      Cognition Arousal/Alertness: Awake/alert Behavior During Therapy:  (tearful) Overall Cognitive Status: Impaired/Different from baseline Area of Impairment: Problem solving, Safety/judgement,  Awareness                         Safety/Judgement: Decreased awareness of safety Awareness: Emergent Problem Solving: Slow processing General Comments: pt perseverating on "my daughter did this to me", "I'm so humiliated". Pt repeating same story multiple times: "you know why I am like this dont you", "you have heard the story havent you"        Exercises      Shoulder  Instructions       General Comments VSS    Pertinent Vitals/ Pain       Pain Assessment Pain Assessment: Faces Faces Pain Scale: Hurts a little bit Pain Location: back Pain Descriptors / Indicators: Discomfort Pain Intervention(s): Limited activity within patient's tolerance, Monitored during session  Home Living                                          Prior Functioning/Environment              Frequency  Min 2X/week        Progress Toward Goals  OT Goals(current goals can now be found in the care plan section)  Progress towards OT goals: Progressing toward goals  Acute Rehab OT Goals Patient Stated Goal: figure out situation with daughter OT Goal Formulation: With patient Time For Goal Achievement: 01/23/22 Potential to Achieve Goals: Good  Plan Frequency remains appropriate;Discharge plan remains appropriate    Co-evaluation                 AM-PAC OT "6 Clicks" Daily Activity     Outcome Measure   Help from another person eating meals?: None Help from another person taking care of personal grooming?: None Help from another person toileting, which includes using toliet, bedpan, or urinal?: A Little Help from another person bathing (including washing, rinsing, drying)?: A Little Help from another person to put on and taking off regular upper body clothing?: None Help from another person to put on and taking off regular lower body clothing?: A Little 6 Click Score: 21    End of Session Equipment Utilized During Treatment: Gait belt;Rolling walker (2 wheels)  OT Visit Diagnosis: Unsteadiness on feet (R26.81);Muscle weakness (generalized) (M62.81)   Activity Tolerance Other (comment) (treatment limited by emotional state)   Patient Left in bed;with call bell/phone within reach;with bed alarm set   Nurse Communication Mobility status        Time: 7915-0569 OT Time Calculation (min): 36 min  Charges: OT General Charges $OT  Visit: 1 Visit OT Treatments $Self Care/Home Management : 23-37 mins  Elder Cyphers, OTR/L [facility] Office: [phone]    Magnus Ivan 01/11/2022, 6:15 PM

## 2022-01-11 NOTE — Progress Notes (Addendum)
Initial Nutrition Assessment  DOCUMENTATION CODES:   Not applicable  INTERVENTION:  - Add Ensure Enlive po BID, each supplement provides 350 kcal and 20 grams of protein.  - Add MVI q day.   - Start Calorie Count.  NUTRITION DIAGNOSIS:   Inadequate oral intake related to inability to eat as evidenced by meal completion < 50%.  GOAL:   Patient will meet greater than or equal to 90% of their needs  MONITOR:   PO intake, Supplement acceptance  REASON FOR ASSESSMENT:   Consult Assessment of nutrition requirement/status  ASSESSMENT:   88 y.o. female admits related to generalized weakness. PMH includes: memory loss, skin cancer, peptic ulcer disease, anemia, hypothyroidism, HTN, diverticulosis, CKD 3, afib, CHF. Pt is currently receiving medical management for failure to thrive and generalized weakness.  Meds reviewed: pepcid. Labs reviewed: K low.   RD attempted to call pt room but no answer. The pt ate 25% of her lunch today per record. Wts stable per record. RD will add Ensure BID for now. Will continue to monitor PO intakes. RD will continue to monitor PO intakes.   MD consult for calorie count. Will have to collect on 12/26 when RD team is back in-person. Discussed with RN.   NUTRITION - FOCUSED PHYSICAL EXAM:  Unable to assess, attempt at f/u.   Diet Order:   Diet Order             Diet regular Room service appropriate? Yes; Fluid consistency: Thin  Diet effective now                   EDUCATION NEEDS:   Not appropriate for education at this time  Skin:  Skin Assessment: Skin Integrity Issues: Skin Integrity Issues:: Other (Comment) Other: venous stasis ulcer to right leg  Last BM:  12/21  Height:   Ht Readings from Last 1 Encounters:  01/08/22 '5\' 5"'$  (1.651 m)    Weight:   Wt Readings from Last 1 Encounters:  01/10/22 64.2 kg    Ideal Body Weight:     BMI:  Body mass index is 23.55 kg/m.  Estimated Nutritional Needs:   Kcal:   4742-5956 kcals  Protein:  80-95 gm  Fluid:  >/= 1.6 L  Thalia Bloodgood, RD, LDN, CNSC.

## 2022-01-12 DIAGNOSIS — R531 Weakness: Secondary | ICD-10-CM | POA: Diagnosis not present

## 2022-01-12 LAB — RENAL FUNCTION PANEL
Albumin: 2.3 g/dL — ABNORMAL LOW (ref 3.5–5.0)
Anion gap: 7 (ref 5–15)
BUN: 10 mg/dL (ref 8–23)
CO2: 24 mmol/L (ref 22–32)
Calcium: 8.7 mg/dL — ABNORMAL LOW (ref 8.9–10.3)
Chloride: 103 mmol/L (ref 98–111)
Creatinine, Ser: 0.94 mg/dL (ref 0.44–1.00)
GFR, Estimated: 56 mL/min — ABNORMAL LOW (ref >60)
Glucose, Bld: 133 mg/dL — ABNORMAL HIGH (ref 70–99)
Phosphorus: 2.5 mg/dL (ref 2.5–4.6)
Potassium: 3.8 mmol/L (ref 3.5–5.1)
Sodium: 134 mmol/L — ABNORMAL LOW (ref 135–145)

## 2022-01-12 LAB — MAGNESIUM: Magnesium: 1.7 mg/dL (ref 1.7–2.4)

## 2022-01-12 MED ORDER — MAGNESIUM SULFATE 2 GM/50ML IV SOLN
2.0000 g | Freq: Once | INTRAVENOUS | Status: AC
  Administered 2022-01-12: 2 g via INTRAVENOUS
  Filled 2022-01-12: qty 50

## 2022-01-12 MED ORDER — BUSPIRONE HCL 5 MG PO TABS
15.0000 mg | ORAL_TABLET | Freq: Three times a day (TID) | ORAL | Status: DC
  Administered 2022-01-12 – 2022-01-14 (×4): 15 mg via ORAL
  Filled 2022-01-12 (×5): qty 3

## 2022-01-12 NOTE — Progress Notes (Signed)
PROGRESS NOTE    [name]  ZOX:[id number] DOB: 1925-08-05 DOA: 01/08/2022 PCP: Tresa Garter, MD  Outpatient Specialists:     Brief Narrative:  As per H&P done on admission: "[name] is a 88 y.o. female with medical history significant of memory loss, skin cancer, peptic ulcer disease, anemia, hypothyroidism, hypertension, diverticulosis, CKD 3, venous insufficiency, atrial fibrillation, diastolic CHF presenting with generalized weakness.   Patient had been fairly independent at her independent living facility but for the past several days she has had significant worsening fatigue and generalized weakness.  Also has had decreased p.o. intake in addition to staying her bed most of the day in the last couple days.  She does live alone at this facility but get some home health 3 times a week.   She has chronic lower extremity ulcers from her venous stasis and recently started treatment for possible cellulitis about a week ago.  Started on a Omadacyclin.  She denies fevers, chills, chest pain, shortness breath, abdominal pain, constipation, diarrhea, nausea, vomiting.   ED Course: Vital signs in the ED significant for blood pressure in the 100s to 130s systolic, respiratory rate in the teens to 20s.  Lab workup included CMP with creatinine near baseline at 1.18, glucose 110, calcium 8.8, protein 6.4, albumin 2.4.  CBC with anemia stable 10.8.  BNP mildly elevated at 504.  Troponin initially mildly elevated at 22 but normal on repeat.  TSH normal.  Rester panel for flu and COVID-negative.  Urinalysis with leukocytes and bacteria.  Urine culture and blood culture pending.  Magnesium and phosphorus normal.  Chest x-ray with mild cardiomegaly but no acute normality.  Patient received dose of ceftriaxone, 1.5 L of IV fluids in the ED".  01/09/2022: Patient seen.  Patient is not a particularly good historian.  Patient continues to report feeling hot.  Temperature was rechecked and came  back normal.  Patient reports feeling unwell, but could not elaborate further.  01/10/2022: Patient seen.  Patient is slowly improving, but remains encephalopathic.  Urine cultures growing Proteus mirabilis.  Albumin is 2.2.  Continue IV ceftriaxone.  Follow final urine cultures.  Consult PT OT.  Dietary consult.  Calorie count.  Further management depend on hospital course.  TOC input to be highly appreciated.  01/11/2022: Patient seen.  Patient is a lot better today.  Urine culture grew Proteus mirabilis that is sensitive.  Continue Rocephin.  Encephalopathy seems to be resolving.  Physical therapy and Occupational Therapy input is appreciated.  Hopefully, patient will be discharged in the next 1 to 2 days.  Kidney injury has resolved.  Potassium is noted to be 3.3 today, will replete.  01/12/2022: Patient seen.  Altered mentation seems to be improving.  Continue current management.  PT OT input is appreciated.  Transition of care input is appreciated.   Assessment & Plan:   Principal Problem:   Generalized weakness Active Problems:   Hypothyroidism   Iron deficiency anemia   Essential hypertension   Atrial flutter (HCC)   Peptic ulcer   UTI (urinary tract infection)   Memory loss   Skin cancer   CKD (chronic kidney disease) stage 3, GFR 30-59 ml/min (HCC)   FTT (failure to thrive) in adult   Failure to thrive/generalized weakness UTI > Patient presenting with 3 days of worsening weakness/fatigue.  Also with decreased p.o. intake. > Reportedly has been living fairly independently at independent living facility but with his fatigue she has had trouble even getting  up out of bed.  She only has home health 3 times a week. > Lab workup largely reassuring in the ED.  Did have evidence of possible UTI with leukocytes and bacteria in her urine with no other explanation she has been started on ceftriaxone. > Of note, she has been started on a tetracycline week ago for concern for cellulitis  of the lower extremities which at this time appears to be stable/improving from outpatient visit (though less likely, if urine culture is negative, this medication could cause fatigue). - Monitor on MedSurg unit overnight - Continue with ceftriaxone - Follow-up urine culture and blood cultures - Trend fever curve and WBC - PT/OT eval and treat 01/09/2022: Follow cultures.  Supportive care.  Pursue echocardiogram.  Follow PT OT recommendations.  Admit patient to inpatient, telemetry monitoring. 01/10/2022: Urine culture is growing Proteus mirabilis.  Follow final urine culture.  Continue IV ceftriaxone.  Patient is slowly improving.  PT OT consult.  Dietary consult.  Caloric count. 01/11/2022: Patient is improving.  Continue antibiotics for now.  Likely discharge in 1 to 2 days, if patient continues to improve. 01/12/2022: Patient has continued to improve.  Encephalopathy, combined, toxic and metabolic: -Urine culture is growing Proteus. -Acute kidney injury slowly resolving. -Patient is slowly improving. 01/12/2022: Patient continues to improve.   Venous stasis Venous stasis ulcers Recent cellulitis > Known history of venous stasis and subsequent ulceration that she follows with wound care for.  Concern for recent cellulitis and started on a Omadacycline outpatient.  This appears to be stable/improving.  No leukocytosis. - Holding outpatient antibiotic, as we do not have in the hospital and she did not bring her supply with her - Will be on ceftriaxone and this has similar coverage to outpatient antibiotic - Trend fever curve and WBC - PT/OT eval and treat - WOC consulted in the ED   PUD - Continue home Pepcid   Anemia -Hemoglobin is 11.2 g/dL today. -MCV is elevated at 104.5. -B12 and folate are normal. -Possibly secondary to myelodysplastic syndrome.   Hypothyroidism - Continue home Synthroid   Hypertension - Continue home diltiazem and metoprolol   Acute kidney  injury: -Creatinine today is 1.11 with EGFR of 46 mL/min per 1.73 m. -Continue to monitor renal function and electrolytes -Avoid nephrotoxins. -Keep MAP greater than 65 mmHg. 01/10/2022: Renal function continues to improve.  Renal panel in the morning. 01/11/2022: AKI has resolved.   Atrial fibrillation - Continue home diltiazem, metoprolol, Eliquis   Diastolic CHF > Chronic history of this on Lasix twice daily.  Last echo was in 2019 with EF 55%, indeterminate diastolic function. -Repeat echocardiogram. - Holding Lasix in the setting of decreased p.o. intake/mild dehydration - Continue home metoprolol  Volume depletion: -See above. -Lasix is on hold. 01/11/2022: Resolved significantly     DVT prophylaxis: Eliquis Code Status: DNR/DNI Family Communication:  Disposition Plan:    Consultants:  None  Procedures:    Antimicrobials:  IV Rocephin.   Subjective: -Patient has continued to improve.  Objective: Vitals:   01/12/22 1233 01/12/22 1234 01/12/22 1235 01/12/22 1236  BP:   126/65   Pulse: 88 79 80 87  Resp: 20 (!) 27 (!) 29 (!) 22  Temp:      TempSrc:      SpO2: 97% 97% 94% 96%  Weight:      Height:        Intake/Output Summary (Last 24 hours) at 01/12/2022 1613 Last data filed at 01/12/2022 0400 Gross per  24 hour  Intake 1157.88 ml  Output 600 ml  Net 557.88 ml    Filed Weights   01/08/22 2346 01/10/22 0500 01/12/22 0434  Weight: 67.7 kg 64.2 kg 65 kg    Examination:  General exam: Appears calm and comfortable.  Patient is pale. Respiratory system: Clear to auscultation.  Cardiovascular system: S1 & S2 heard Gastrointestinal system: Abdomen is soft and nontender.  Central nervous system: Alert and oriented. Patient improves all extremities. Extremities: Both lower legs are wrapped.  Data Reviewed: I have personally reviewed following labs and imaging studies  CBC: Recent Labs  Lab 01/08/22 1255 01/09/22 0533 01/11/22 0312  WBC  7.1 8.4 5.5  NEUTROABS  --   --  3.1  HGB 10.8* 11.2* 10.7*  HCT 29.3* 30.4* 29.2*  MCV 108.1* 104.5* 107.0*  PLT 322 314 319    Basic Metabolic Panel: Recent Labs  Lab 01/08/22 1255 01/09/22 0533 01/11/22 0312 01/12/22 0320  NA 136 135 138 134*  K 4.2 3.6 3.3* 3.8  CL 98 100 105 103  CO2 27 27 24 24   GLUCOSE 110* 101* 87 133*  BUN 21 13 11 10   CREATININE 1.18* 1.11* 0.89 0.94  CALCIUM 8.8* 8.6* 8.5* 8.7*  MG 1.8  --   --  1.7  PHOS 3.2  --  3.0 2.5    GFR: Estimated Creatinine Clearance: 32.9 mL/min (by C-G formula based on SCr of 0.94 mg/dL). Liver Function Tests: Recent Labs  Lab 01/08/22 1255 01/09/22 0533 01/11/22 0312 01/12/22 0320  AST 33 32  --   --   ALT 17 17  --   --   ALKPHOS 61 62  --   --   BILITOT 1.0 0.9  --   --   PROT 6.4* 6.5  --   --   ALBUMIN 2.4* 2.3* 2.2* 2.3*    No results for input(s): "LIPASE", "AMYLASE" in the last 168 hours. No results for input(s): "AMMONIA" in the last 168 hours. Coagulation Profile: No results for input(s): "INR", "PROTIME" in the last 168 hours. Cardiac Enzymes: No results for input(s): "CKTOTAL", "CKMB", "CKMBINDEX", "TROPONINI" in the last 168 hours. BNP (last 3 results) No results for input(s): "PROBNP" in the last 8760 hours. HbA1C: No results for input(s): "HGBA1C" in the last 72 hours. CBG: No results for input(s): "GLUCAP" in the last 168 hours. Lipid Profile: No results for input(s): "CHOL", "HDL", "LDLCALC", "TRIG", "CHOLHDL", "LDLDIRECT" in the last 72 hours. Thyroid Function Tests: No results for input(s): "TSH", "T4TOTAL", "FREET4", "T3FREE", "THYROIDAB" in the last 72 hours.  Anemia Panel: No results for input(s): "VITAMINB12", "FOLATE", "FERRITIN", "TIBC", "IRON", "RETICCTPCT" in the last 72 hours.  Urine analysis:    Component Value Date/Time   COLORURINE YELLOW 01/08/2022 1600   APPEARANCEUR HAZY (A) 01/08/2022 1600   LABSPEC 1.004 (L) 01/08/2022 1600   PHURINE 7.0 01/08/2022 1600    GLUCOSEU NEGATIVE 01/08/2022 1600   GLUCOSEU NEGATIVE 01/17/2015 1415   HGBUR NEGATIVE 01/08/2022 1600   BILIRUBINUR NEGATIVE 01/08/2022 1600   KETONESUR NEGATIVE 01/08/2022 1600   PROTEINUR NEGATIVE 01/08/2022 1600   UROBILINOGEN 0.2 01/17/2015 1415   NITRITE NEGATIVE 01/08/2022 1600   LEUKOCYTESUR LARGE (A) 01/08/2022 1600   Sepsis Labs: @LABRCNTIP (procalcitonin:4,lacticidven:4)  ) Recent Results (from the past 240 hour(s))  Resp panel by RT-PCR (RSV, Flu A&B, Covid) Anterior Nasal Swab     Status: None   Collection Time: 01/08/22 12:16 PM   Specimen: Anterior Nasal Swab  Result Value Ref Range Status  SARS Coronavirus 2 by RT PCR NEGATIVE NEGATIVE Final    Comment: (NOTE) SARS-CoV-2 target nucleic acids are NOT DETECTED.  The SARS-CoV-2 RNA is generally detectable in upper respiratory specimens during the acute phase of infection. The lowest concentration of SARS-CoV-2 viral copies this assay can detect is 138 copies/mL. A negative result does not preclude SARS-Cov-2 infection and should not be used as the sole basis for treatment or other patient management decisions. A negative result may occur with  improper specimen collection/handling, submission of specimen other than nasopharyngeal swab, presence of viral mutation(s) within the areas targeted by this assay, and inadequate number of viral copies(<138 copies/mL). A negative result must be combined with clinical observations, patient history, and epidemiological information. The expected result is Negative.  Fact Sheet for Patients:  BloggerCourse.com  Fact Sheet for Healthcare Providers:  SeriousBroker.it  This test is no t yet approved or cleared by the [place] FDA and  has been authorized for detection and/or diagnosis of SARS-CoV-2 by FDA under an Emergency Use Authorization (EUA). This EUA will remain  in effect (meaning this test can be used) for the  duration of the COVID-19 declaration under Section 564(b)(1) of the Act, 21 U.S.C.section 360bbb-3(b)(1), unless the authorization is terminated  or revoked sooner.       Influenza A by PCR NEGATIVE NEGATIVE Final   Influenza B by PCR NEGATIVE NEGATIVE Final    Comment: (NOTE) The Xpert Xpress SARS-CoV-2/FLU/RSV plus assay is intended as an aid in the diagnosis of influenza from Nasopharyngeal swab specimens and should not be used as a sole basis for treatment. Nasal washings and aspirates are unacceptable for Xpert Xpress SARS-CoV-2/FLU/RSV testing.  Fact Sheet for Patients: BloggerCourse.com  Fact Sheet for Healthcare Providers: SeriousBroker.it  This test is not yet approved or cleared by the [place] FDA and has been authorized for detection and/or diagnosis of SARS-CoV-2 by FDA under an Emergency Use Authorization (EUA). This EUA will remain in effect (meaning this test can be used) for the duration of the COVID-19 declaration under Section 564(b)(1) of the Act, 21 U.S.C. section 360bbb-3(b)(1), unless the authorization is terminated or revoked.     Resp Syncytial Virus by PCR NEGATIVE NEGATIVE Final    Comment: (NOTE) Fact Sheet for Patients: BloggerCourse.com  Fact Sheet for Healthcare Providers: SeriousBroker.it  This test is not yet approved or cleared by the [place] FDA and has been authorized for detection and/or diagnosis of SARS-CoV-2 by FDA under an Emergency Use Authorization (EUA). This EUA will remain in effect (meaning this test can be used) for the duration of the COVID-19 declaration under Section 564(b)(1) of the Act, 21 U.S.C. section 360bbb-3(b)(1), unless the authorization is terminated or revoked.  Performed at [facility], [address], [place], [place] [address]   Urine Culture     Status: Abnormal   Collection Time:  01/08/22  5:36 PM   Specimen: Urine, Clean Catch  Result Value Ref Range Status   Specimen Description URINE, CLEAN CATCH  Final   Special Requests   Final    NONE Performed at [facility], [address], [place], [place] [address]    Culture >=100,000 COLONIES/mL PROTEUS MIRABILIS (A)  Final   Report Status 01/11/2022 FINAL  Final   Organism ID, Bacteria PROTEUS MIRABILIS (A)  Final      Susceptibility   Proteus mirabilis - MIC*    AMPICILLIN <=2 SENSITIVE Sensitive     CEFAZOLIN 8 SENSITIVE Sensitive  CEFEPIME <=0.12 SENSITIVE Sensitive     CEFTRIAXONE <=0.25 SENSITIVE Sensitive     CIPROFLOXACIN <=0.25 SENSITIVE Sensitive     GENTAMICIN <=1 SENSITIVE Sensitive     IMIPENEM 2 SENSITIVE Sensitive     NITROFURANTOIN 128 RESISTANT Resistant     TRIMETH/SULFA <=20 SENSITIVE Sensitive     AMPICILLIN/SULBACTAM <=2 SENSITIVE Sensitive     PIP/TAZO <=4 SENSITIVE Sensitive     * >=100,000 COLONIES/mL PROTEUS MIRABILIS  Blood culture (routine x 2)     Status: None (Preliminary result)   Collection Time: 01/08/22  5:39 PM   Specimen: BLOOD LEFT FOREARM  Result Value Ref Range Status   Specimen Description BLOOD LEFT FOREARM  Final   Special Requests   Final    BOTTLES DRAWN AEROBIC AND ANAEROBIC Blood Culture results may not be optimal due to an excessive volume of blood received in culture bottles   Culture   Final    NO GROWTH 4 DAYS Performed at [facility], [address] [place], [place] [address]    Report Status PENDING  Incomplete  Culture, blood (Routine X 2) w Reflex to ID Panel     Status: None (Preliminary result)   Collection Time: 01/09/22  5:33 AM   Specimen: BLOOD LEFT ARM  Result Value Ref Range Status   Specimen Description BLOOD LEFT ARM  Final   Special Requests   Final    BOTTLES DRAWN AEROBIC ONLY Blood Culture results may not be optimal due to an inadequate volume of blood received in culture bottles   Culture   Final    NO GROWTH 3  DAYS Performed at [facility], [address] [name], [place] [address]    Report Status PENDING  Incomplete         Radiology Studies: No results found.      Scheduled Meds:  apixaban  5 mg Oral BID   diltiazem  120 mg Oral Daily   famotidine  10 mg Oral Daily   feeding supplement  237 mL Oral BID BM   levothyroxine  25 mcg Oral QAC breakfast   metoprolol succinate  25 mg Oral Daily   multivitamin with minerals  1 tablet Oral Daily   sodium chloride flush  3 mL Intravenous Q12H   Continuous Infusions:  cefTRIAXone (ROCEPHIN)  IV 2 g (01/12/22 1610)     LOS: 3 days    Time spent: 35 minutes.    Berton Mount, MD  [facility] Pager #: [phone] 7PM-7AM contact night coverage as above

## 2022-01-12 NOTE — Plan of Care (Signed)

## 2022-01-13 DIAGNOSIS — R531 Weakness: Secondary | ICD-10-CM | POA: Diagnosis not present

## 2022-01-13 LAB — CBC WITH DIFFERENTIAL/PLATELET
Abs Immature Granulocytes: 0.02 10*3/uL (ref 0.00–0.07)
Basophils Absolute: 0.1 10*3/uL (ref 0.0–0.1)
Basophils Relative: 1 %
Eosinophils Absolute: 0.3 10*3/uL (ref 0.0–0.5)
Eosinophils Relative: 4 %
HCT: 30 % — ABNORMAL LOW (ref 36.0–46.0)
Hemoglobin: 11.2 g/dL — ABNORMAL LOW (ref 12.0–15.0)
Immature Granulocytes: 0 %
Lymphocytes Relative: 11 %
Lymphs Abs: 0.9 10*3/uL (ref 0.7–4.0)
MCH: 41.9 pg — ABNORMAL HIGH (ref 26.0–34.0)
MCHC: 37.3 g/dL — ABNORMAL HIGH (ref 30.0–36.0)
MCV: 112.4 fL — ABNORMAL HIGH (ref 80.0–100.0)
Monocytes Absolute: 0.7 10*3/uL (ref 0.1–1.0)
Monocytes Relative: 10 %
Neutro Abs: 5.6 10*3/uL (ref 1.7–7.7)
Neutrophils Relative %: 74 %
Platelets: 350 10*3/uL (ref 150–400)
RBC: 2.67 MIL/uL — ABNORMAL LOW (ref 3.87–5.11)
RDW: 16.4 % — ABNORMAL HIGH (ref 11.5–15.5)
Smear Review: ADEQUATE
WBC: 7.6 10*3/uL (ref 4.0–10.5)
nRBC: 0 % (ref 0.0–0.2)

## 2022-01-13 LAB — CULTURE, BLOOD (ROUTINE X 2): Culture: NO GROWTH

## 2022-01-13 LAB — RENAL FUNCTION PANEL
Albumin: 2.4 g/dL — ABNORMAL LOW (ref 3.5–5.0)
Anion gap: 12 (ref 5–15)
BUN: 14 mg/dL (ref 8–23)
CO2: 22 mmol/L (ref 22–32)
Calcium: 8.8 mg/dL — ABNORMAL LOW (ref 8.9–10.3)
Chloride: 104 mmol/L (ref 98–111)
Creatinine, Ser: 0.79 mg/dL (ref 0.44–1.00)
GFR, Estimated: >60 mL/min (ref >60)
Glucose, Bld: 160 mg/dL — ABNORMAL HIGH (ref 70–99)
Phosphorus: 2.8 mg/dL (ref 2.5–4.6)
Potassium: 4 mmol/L (ref 3.5–5.1)
Sodium: 138 mmol/L (ref 135–145)

## 2022-01-13 NOTE — Progress Notes (Signed)
PROGRESS NOTE    [name]  JGO:[id number] DOB: September 15, 1925 DOA: 01/08/2022 PCP: [name], MD  Outpatient Specialists:     Brief Narrative:  As per H&P done on admission: "[name] is a 88 y.o. female with medical history significant of memory loss, skin cancer, peptic ulcer disease, anemia, hypothyroidism, hypertension, diverticulosis, CKD 3, venous insufficiency, atrial fibrillation, diastolic CHF presenting with generalized weakness.   Patient had been fairly independent at her independent living facility but for the past several days she has had significant worsening fatigue and generalized weakness.  Also has had decreased p.o. intake in addition to staying her bed most of the day in the last couple days.  She does live alone at this facility but get some home health 3 times a week.   She has chronic lower extremity ulcers from her venous stasis and recently started treatment for possible cellulitis about a week ago.  Started on a Omadacyclin.  She denies fevers, chills, chest pain, shortness breath, abdominal pain, constipation, diarrhea, nausea, vomiting.   ED Course: Vital signs in the ED significant for blood pressure in the 559R to 416L systolic, respiratory rate in the teens to 20s.  Lab workup included CMP with creatinine near baseline at 1.18, glucose 110, calcium 8.8, protein 6.4, albumin 2.4.  CBC with anemia stable 10.8.  BNP mildly elevated at 504.  Troponin initially mildly elevated at 22 but normal on repeat.  TSH normal.  Rester panel for flu and COVID-negative.  Urinalysis with leukocytes and bacteria.  Urine culture and blood culture pending.  Magnesium and phosphorus normal.  Chest x-ray with mild cardiomegaly but no acute normality.  Patient received dose of ceftriaxone, 1.5 L of IV fluids in the ED".  01/09/2022: Patient seen.  Patient is not a particularly good historian.  Patient continues to report feeling hot.  Temperature was rechecked and came  back normal.  Patient reports feeling unwell, but could not elaborate further.  01/10/2022: Patient seen.  Patient is slowly improving, but remains encephalopathic.  Urine cultures growing Proteus mirabilis.  Albumin is 2.2.  Continue IV ceftriaxone.  Follow final urine cultures.  Consult PT OT.  Dietary consult.  Calorie count.  Further management depend on hospital course.  TOC input to be highly appreciated.  01/11/2022: Patient seen.  Patient is a lot better today.  Urine culture grew Proteus mirabilis that is sensitive.  Continue Rocephin.  Encephalopathy seems to be resolving.  Physical therapy and Occupational Therapy input is appreciated.  Hopefully, patient will be discharged in the next 1 to 2 days.  Kidney injury has resolved.  Potassium is noted to be 3.3 today, will replete.  01/12/2022: Patient seen.  Altered mentation seems to be improving.  Continue current management.  PT OT input is appreciated.  Transition of care input is appreciated.  01/13/2022: Patient seen.  Patient has continued to improve.  Pursue disposition.  Input from [facility] is appreciated.   Assessment & Plan:   Principal Problem:   Generalized weakness Active Problems:   Hypothyroidism   Iron deficiency anemia   Essential hypertension   Atrial flutter (HCC)   Peptic ulcer   UTI (urinary tract infection)   Memory loss   Skin cancer   CKD (chronic kidney disease) stage 3, GFR 30-59 ml/min (HCC)   FTT (failure to thrive) in adult   Failure to thrive/generalized weakness UTI > Patient presenting with 3 days of worsening weakness/fatigue.  Also with decreased p.o. intake. > Reportedly  has been living fairly independently at independent living facility but with his fatigue she has had trouble even getting up out of bed.  She only has home health 3 times a week. > Lab workup largely reassuring in the ED.  Did have evidence of possible UTI with leukocytes and bacteria in her urine with no other explanation she has  been started on ceftriaxone. > Of note, she has been started on a tetracycline week ago for concern for cellulitis of the lower extremities which at this time appears to be stable/improving from outpatient visit (though less likely, if urine culture is negative, this medication could cause fatigue). - Monitor on MedSurg unit overnight - Continue with ceftriaxone - Follow-up urine culture and blood cultures - Trend fever curve and WBC - PT/OT eval and treat 01/09/2022: Follow cultures.  Supportive care.  Pursue echocardiogram.  Follow PT OT recommendations.  Admit patient to inpatient, telemetry monitoring. 01/10/2022: Urine culture is growing Proteus mirabilis.  Follow final urine culture.  Continue IV ceftriaxone.  Patient is slowly improving.  PT OT consult.  Dietary consult.  Caloric count. 01/11/2022: Patient is improving.  Continue antibiotics for now.  Likely discharge in 1 to 2 days, if patient continues to improve. 01/13/2022: Patient has continued to improve.  Encephalopathy, combined, toxic and metabolic: -Urine culture is growing Proteus. -Acute kidney injury slowly resolving. -Patient is slowly improving. 01/13/2022: Patient continues to improve.   Venous stasis Venous stasis ulcers Recent cellulitis > Known history of venous stasis and subsequent ulceration that she follows with wound care for.  Concern for recent cellulitis and started on a Omadacycline outpatient.  This appears to be stable/improving.  No leukocytosis. - Holding outpatient antibiotic, as we do not have in the hospital and she did not bring her supply with her - Will be on ceftriaxone and this has similar coverage to outpatient antibiotic - Trend fever curve and WBC - PT/OT eval and treat - WOC consulted in the ED   PUD - Continue home Pepcid   Anemia -Hemoglobin is 11.2 g/dL today. -MCV is elevated at 104.5. -B12 and folate are normal. -Possibly secondary to myelodysplastic syndrome.    Hypothyroidism - Continue home Synthroid   Hypertension - Continue home diltiazem and metoprolol   Acute kidney injury: -Creatinine today is 1.11 with EGFR of 46 mL/min per 1.73 m. -Continue to monitor renal function and electrolytes -Avoid nephrotoxins. -Keep MAP greater than 65 mmHg. 01/10/2022: Renal function continues to improve.  Renal panel in the morning. 01/11/2022: AKI has resolved.   Atrial fibrillation - Continue home diltiazem, metoprolol, Eliquis   Diastolic CHF > Chronic history of this on Lasix twice daily.  Last echo was in 2019 with EF 55%, indeterminate diastolic function. -Repeat echocardiogram. - Holding Lasix in the setting of decreased p.o. intake/mild dehydration - Continue home metoprolol  Volume depletion: -See above. -Lasix is on hold. 01/11/2022: Resolved significantly     DVT prophylaxis: Eliquis Code Status: DNR/DNI Family Communication:  Disposition Plan:    Consultants:  None  Procedures:    Antimicrobials:  IV Rocephin.   Subjective: -Patient has continued to improve.  Objective: Vitals:   01/12/22 2100 01/13/22 0030 01/13/22 0453 01/13/22 0500  BP: (!) 145/82 102/63 124/61   Pulse: 80 80 67   Resp: (!) 21 (!) 21 (!) 22   Temp: 98 F (36.7 C) 98.4 F (36.9 C) 98.3 F (36.8 C)   TempSrc: Oral Oral Oral   SpO2: 96% 95% 98%   Weight:  65.7 kg  Height:        Intake/Output Summary (Last 24 hours) at 01/13/2022 0757 Last data filed at 01/13/2022 0500 Gross per 24 hour  Intake 240 ml  Output 450 ml  Net -210 ml    Filed Weights   01/10/22 0500 01/12/22 0434 01/13/22 0500  Weight: 64.2 kg 65 kg 65.7 kg    Examination:  General exam: Appears calm and comfortable.  Patient is pale. Respiratory system: Clear to auscultation.  Cardiovascular system: S1 & S2 heard Gastrointestinal system: Abdomen is soft and nontender.  Central nervous system: Alert and oriented. Patient improves all  extremities. Extremities: Both lower legs are wrapped.  Data Reviewed: I have personally reviewed following labs and imaging studies  CBC: Recent Labs  Lab 01/08/22 1255 01/09/22 0533 01/11/22 0312  WBC 7.1 8.4 5.5  NEUTROABS  --   --  3.1  HGB 10.8* 11.2* 10.7*  HCT 29.3* 30.4* 29.2*  MCV 108.1* 104.5* 107.0*  PLT 322 314 093    Basic Metabolic Panel: Recent Labs  Lab 01/08/22 1255 01/09/22 0533 01/11/22 0312 01/12/22 0320  NA 136 135 138 134*  K 4.2 3.6 3.3* 3.8  CL 98 100 105 103  CO2 _0 GLUCOSE 110* 101* 87 133*  BUN _1 CREATININE 1.18* 1.11* 0.89 0.94  CALCIUM 8.8* 8.6* 8.5* 8.7*  MG 1.8  --   --  1.7  PHOS 3.2  --  3.0 2.5    GFR: Estimated Creatinine Clearance: 32.9 mL/min (by C-G formula based on SCr of 0.94 mg/dL). Liver Function Tests: Recent Labs  Lab 01/08/22 1255 01/09/22 0533 01/11/22 0312 01/12/22 0320  AST 33 32  --   --   ALT 17 17  --   --   ALKPHOS 61 62  --   --   BILITOT 1.0 0.9  --   --   PROT 6.4* 6.5  --   --   ALBUMIN 2.4* 2.3* 2.2* 2.3*    No results for input(s): "LIPASE", "AMYLASE" in the last 168 hours. No results for input(s): "AMMONIA" in the last 168 hours. Coagulation Profile: No results for input(s): "INR", "PROTIME" in the last 168 hours. Cardiac Enzymes: No results for input(s): "CKTOTAL", "CKMB", "CKMBINDEX", "TROPONINI" in the last 168 hours. BNP (last 3 results) No results for input(s): "PROBNP" in the last 8760 hours. HbA1C: No results for input(s): "HGBA1C" in the last 72 hours. CBG: No results for input(s): "GLUCAP" in the last 168 hours. Lipid Profile: No results for input(s): "CHOL", "HDL", "LDLCALC", "TRIG", "CHOLHDL", "LDLDIRECT" in the last 72 hours. Thyroid Function Tests: No results for input(s): "TSH", "T4TOTAL", "FREET4", "T3FREE", "THYROIDAB" in the last 72 hours.  Anemia Panel: No results for input(s): "VITAMINB12", "FOLATE", "FERRITIN", "TIBC", "IRON", "RETICCTPCT" in the  last 72 hours.  Urine analysis:    Component Value Date/Time   COLORURINE YELLOW 01/08/2022 1600   APPEARANCEUR HAZY (A) 01/08/2022 1600   LABSPEC 1.004 (L) 01/08/2022 1600   PHURINE 7.0 01/08/2022 1600   GLUCOSEU NEGATIVE 01/08/2022 1600   GLUCOSEU NEGATIVE 01/17/2015 1415   HGBUR NEGATIVE 01/08/2022 1600   BILIRUBINUR NEGATIVE 01/08/2022 1600   KETONESUR NEGATIVE 01/08/2022 1600   PROTEINUR NEGATIVE 01/08/2022 1600   UROBILINOGEN 0.2 01/17/2015 1415   NITRITE NEGATIVE 01/08/2022 1600   LEUKOCYTESUR LARGE (A) 01/08/2022 1600   Sepsis Labs: _2 (procalcitonin:4,lacticidven:4)  ) Recent Results (from the past 240 hour(s))  Resp panel by RT-PCR (RSV, Flu A&B, Covid) Anterior  Nasal Swab     Status: None   Collection Time: 01/08/22 12:16 PM   Specimen: Anterior Nasal Swab  Result Value Ref Range Status   SARS Coronavirus 2 by RT PCR NEGATIVE NEGATIVE Final    Comment: (NOTE) SARS-CoV-2 target nucleic acids are NOT DETECTED.  The SARS-CoV-2 RNA is generally detectable in upper respiratory specimens during the acute phase of infection. The lowest concentration of SARS-CoV-2 viral copies this assay can detect is 138 copies/mL. A negative result does not preclude SARS-Cov-2 infection and should not be used as the sole basis for treatment or other patient management decisions. A negative result may occur with  improper specimen collection/handling, submission of specimen other than nasopharyngeal swab, presence of viral mutation(s) within the areas targeted by this assay, and inadequate number of viral copies(<138 copies/mL). A negative result must be combined with clinical observations, patient history, and epidemiological information. The expected result is Negative.  Fact Sheet for Patients:  EntrepreneurPulse.com.au  Fact Sheet for Healthcare Providers:  IncredibleEmployment.be  This test is no t yet approved or cleared by the  [place] FDA and  has been authorized for detection and/or diagnosis of SARS-CoV-2 by FDA under an Emergency Use Authorization (EUA). This EUA will remain  in effect (meaning this test can be used) for the duration of the COVID-19 declaration under Section 564(b)(1) of the Act, 21 U.S.C.section 360bbb-3(b)(1), unless the authorization is terminated  or revoked sooner.       Influenza A by PCR NEGATIVE NEGATIVE Final   Influenza B by PCR NEGATIVE NEGATIVE Final    Comment: (NOTE) The Xpert Xpress SARS-CoV-2/FLU/RSV plus assay is intended as an aid in the diagnosis of influenza from Nasopharyngeal swab specimens and should not be used as a sole basis for treatment. Nasal washings and aspirates are unacceptable for Xpert Xpress SARS-CoV-2/FLU/RSV testing.  Fact Sheet for Patients: EntrepreneurPulse.com.au  Fact Sheet for Healthcare Providers: IncredibleEmployment.be  This test is not yet approved or cleared by the [place] FDA and has been authorized for detection and/or diagnosis of SARS-CoV-2 by FDA under an Emergency Use Authorization (EUA). This EUA will remain in effect (meaning this test can be used) for the duration of the COVID-19 declaration under Section 564(b)(1) of the Act, 21 U.S.C. section 360bbb-3(b)(1), unless the authorization is terminated or revoked.     Resp Syncytial Virus by PCR NEGATIVE NEGATIVE Final    Comment: (NOTE) Fact Sheet for Patients: EntrepreneurPulse.com.au  Fact Sheet for Healthcare Providers: IncredibleEmployment.be  This test is not yet approved or cleared by the [place] FDA and has been authorized for detection and/or diagnosis of SARS-CoV-2 by FDA under an Emergency Use Authorization (EUA). This EUA will remain in effect (meaning this test can be used) for the duration of the COVID-19 declaration under Section 564(b)(1) of the Act, 21 U.S.C. section  360bbb-3(b)(1), unless the authorization is terminated or revoked.  Performed at [name], [place] [address], [place], [place] [address]   Urine Culture     Status: Abnormal   Collection Time: 01/08/22  5:36 PM   Specimen: Urine, Clean Catch  Result Value Ref Range Status   Specimen Description URINE, CLEAN CATCH  Final   Special Requests   Final    NONE Performed at [facility], [place] [address] [place],  [address]    Culture >=100,000 COLONIES/mL PROTEUS MIRABILIS (A)  Final   Report Status 01/11/2022 FINAL  Final   Organism ID, Bacteria PROTEUS MIRABILIS (A)  Final      Susceptibility   Proteus mirabilis - MIC*    AMPICILLIN <=2 SENSITIVE Sensitive     CEFAZOLIN 8 SENSITIVE Sensitive     CEFEPIME <=0.12 SENSITIVE Sensitive     CEFTRIAXONE <=0.25 SENSITIVE Sensitive     CIPROFLOXACIN <=0.25 SENSITIVE Sensitive     GENTAMICIN <=1 SENSITIVE Sensitive     IMIPENEM 2 SENSITIVE Sensitive     NITROFURANTOIN 128 RESISTANT Resistant     TRIMETH/SULFA <=20 SENSITIVE Sensitive     AMPICILLIN/SULBACTAM <=2 SENSITIVE Sensitive     PIP/TAZO <=4 SENSITIVE Sensitive     * >=100,000 COLONIES/mL PROTEUS MIRABILIS  Blood culture (routine x 2)     Status: None (Preliminary result)   Collection Time: 01/08/22  5:39 PM   Specimen: BLOOD LEFT FOREARM  Result Value Ref Range Status   Specimen Description BLOOD LEFT FOREARM  Final   Special Requests   Final    BOTTLES DRAWN AEROBIC AND ANAEROBIC Blood Culture results may not be optimal due to an excessive volume of blood received in culture bottles   Culture   Final    NO GROWTH 4 DAYS Performed at [facility], [place] [address], [place], [place] [address]    Report Status PENDING  Incomplete  Culture, blood (Routine X 2) w Reflex to ID Panel     Status: None (Preliminary result)   Collection Time: 01/09/22  5:33 AM   Specimen: BLOOD LEFT ARM  Result Value Ref Range Status   Specimen Description BLOOD LEFT ARM  Final    Special Requests   Final    BOTTLES DRAWN AEROBIC ONLY Blood Culture results may not be optimal due to an inadequate volume of blood received in culture bottles   Culture   Final    NO GROWTH 3 DAYS Performed at [facility], [place] [address], [place], [place] [address]    Report Status PENDING  Incomplete         Radiology Studies: No results found.      Scheduled Meds:  apixaban  5 mg Oral BID   busPIRone  15 mg Oral TID   diltiazem  120 mg Oral Daily   famotidine  10 mg Oral Daily   feeding supplement  237 mL Oral BID BM   levothyroxine  25 mcg Oral QAC breakfast   metoprolol succinate  25 mg Oral Daily   multivitamin with minerals  1 tablet Oral Daily   sodium chloride flush  3 mL Intravenous Q12H   Continuous Infusions:     LOS: 4 days    Time spent: 35 minutes.    Dana Allan, MD  [facility] Pager #: [phone] 7PM-7AM contact night coverage as above

## 2022-01-13 NOTE — Progress Notes (Signed)
Dressing change completed to BLEs. Pt tolerated with no problem.

## 2022-01-14 DIAGNOSIS — K219 Gastro-esophageal reflux disease without esophagitis: Secondary | ICD-10-CM | POA: Diagnosis not present

## 2022-01-14 DIAGNOSIS — G9340 Encephalopathy, unspecified: Secondary | ICD-10-CM | POA: Diagnosis not present

## 2022-01-14 DIAGNOSIS — I2510 Atherosclerotic heart disease of native coronary artery without angina pectoris: Secondary | ICD-10-CM | POA: Diagnosis not present

## 2022-01-14 DIAGNOSIS — R2689 Other abnormalities of gait and mobility: Secondary | ICD-10-CM | POA: Diagnosis not present

## 2022-01-14 DIAGNOSIS — R41841 Cognitive communication deficit: Secondary | ICD-10-CM | POA: Diagnosis not present

## 2022-01-14 DIAGNOSIS — B372 Candidiasis of skin and nail: Secondary | ICD-10-CM | POA: Diagnosis not present

## 2022-01-14 DIAGNOSIS — R278 Other lack of coordination: Secondary | ICD-10-CM | POA: Diagnosis not present

## 2022-01-14 DIAGNOSIS — L03116 Cellulitis of left lower limb: Secondary | ICD-10-CM | POA: Diagnosis not present

## 2022-01-14 DIAGNOSIS — R319 Hematuria, unspecified: Secondary | ICD-10-CM | POA: Diagnosis not present

## 2022-01-14 DIAGNOSIS — I7389 Other specified peripheral vascular diseases: Secondary | ICD-10-CM | POA: Diagnosis not present

## 2022-01-14 DIAGNOSIS — S40212A Abrasion of left shoulder, initial encounter: Secondary | ICD-10-CM | POA: Diagnosis not present

## 2022-01-14 DIAGNOSIS — F331 Major depressive disorder, recurrent, moderate: Secondary | ICD-10-CM | POA: Diagnosis not present

## 2022-01-14 DIAGNOSIS — Z7401 Bed confinement status: Secondary | ICD-10-CM | POA: Diagnosis not present

## 2022-01-14 DIAGNOSIS — I10 Essential (primary) hypertension: Secondary | ICD-10-CM | POA: Diagnosis not present

## 2022-01-14 DIAGNOSIS — M6281 Muscle weakness (generalized): Secondary | ICD-10-CM | POA: Diagnosis not present

## 2022-01-14 DIAGNOSIS — R531 Weakness: Secondary | ICD-10-CM | POA: Diagnosis not present

## 2022-01-14 DIAGNOSIS — L03115 Cellulitis of right lower limb: Secondary | ICD-10-CM | POA: Diagnosis not present

## 2022-01-14 DIAGNOSIS — K5900 Constipation, unspecified: Secondary | ICD-10-CM | POA: Diagnosis not present

## 2022-01-14 DIAGNOSIS — I4821 Permanent atrial fibrillation: Secondary | ICD-10-CM | POA: Diagnosis not present

## 2022-01-14 DIAGNOSIS — G4700 Insomnia, unspecified: Secondary | ICD-10-CM | POA: Diagnosis not present

## 2022-01-14 DIAGNOSIS — N390 Urinary tract infection, site not specified: Secondary | ICD-10-CM | POA: Diagnosis not present

## 2022-01-14 DIAGNOSIS — F419 Anxiety disorder, unspecified: Secondary | ICD-10-CM | POA: Diagnosis not present

## 2022-01-14 DIAGNOSIS — R627 Adult failure to thrive: Secondary | ICD-10-CM | POA: Diagnosis not present

## 2022-01-14 DIAGNOSIS — D649 Anemia, unspecified: Secondary | ICD-10-CM | POA: Diagnosis not present

## 2022-01-14 DIAGNOSIS — R2681 Unsteadiness on feet: Secondary | ICD-10-CM | POA: Diagnosis not present

## 2022-01-14 DIAGNOSIS — L97818 Non-pressure chronic ulcer of other part of right lower leg with other specified severity: Secondary | ICD-10-CM | POA: Diagnosis not present

## 2022-01-14 DIAGNOSIS — L97828 Non-pressure chronic ulcer of other part of left lower leg with other specified severity: Secondary | ICD-10-CM | POA: Diagnosis not present

## 2022-01-14 DIAGNOSIS — I872 Venous insufficiency (chronic) (peripheral): Secondary | ICD-10-CM | POA: Diagnosis not present

## 2022-01-14 DIAGNOSIS — I4819 Other persistent atrial fibrillation: Secondary | ICD-10-CM | POA: Diagnosis not present

## 2022-01-14 DIAGNOSIS — R52 Pain, unspecified: Secondary | ICD-10-CM | POA: Diagnosis not present

## 2022-01-14 DIAGNOSIS — I5030 Unspecified diastolic (congestive) heart failure: Secondary | ICD-10-CM | POA: Diagnosis not present

## 2022-01-14 DIAGNOSIS — Z853 Personal history of malignant neoplasm of breast: Secondary | ICD-10-CM | POA: Diagnosis not present

## 2022-01-14 LAB — CULTURE, BLOOD (ROUTINE X 2): Culture: NO GROWTH

## 2022-01-14 MED ORDER — METOPROLOL SUCCINATE ER 25 MG PO TB24: 25.0000 mg | ORAL_TABLET | Freq: Every day | ORAL | 0 refills | Status: AC

## 2022-01-14 MED ORDER — ENSURE ENLIVE PO LIQD: 237.0000 mL | Freq: Two times a day (BID) | ORAL | 12 refills | Status: DC

## 2022-01-14 NOTE — TOC Progression Note (Addendum)
Transition of Care [facility]) - Progression Note    Patient Details  Name: [name] MRN: [id number] Date of Birth: [birth year]  Transition of Care [facility] CM/SW [place], LCSW Phone Number: 01/14/2022, 9:43 AM  Clinical Narrative:    9:43am-CSW left voicemail for [name] at [facility] [place] since [place] is off today, regarding patient's discharge.   CSW spoke with patient's daughter, [name] [facility]), and she expressed frustration with her mom as she just talked with her and patient accused her of stranding her in the hospital without care. [name] reported that she is in [place] and it is becoming increasingly difficult to be verbally abused by patient with her Dementia as she is the only caregiver (other daughter is in [place] but cannot handle patient). She stated she is not sure if patient should go back to her [place] as she is requesting. She asked CSW about her mobility; CSW reached out to PT for an update. CSW explained that [facility] may insist on going to SNF side if patient has not been managing well on the IL side. She stated patient is resistant to any help coming into her apartment and has been resistant to moving to the ALF side. CSW provided supportive listening and will contact daughter after speaking with [place] and therapy.   10am-CSW received return call from [place] at [facility] and they would like for patient to go to the SNF side initially. CSW updated daughter. She reported patient's spouse is on the SNF side with Dementia as well.  CSW and RNCm spoke with patient and provided an update. She was tearful, stating she did not want to her husband to see her "like this" and that they have been married over 20 years and he is the sweetest man. CSW provided supportive listening. Patient stated the staff at [facility] have been wonderful.    Expected Discharge Plan: [place] Barriers to Discharge: Barriers Resolved  Expected Discharge Plan and Services In-house  Referral: Clinical Social Work   Post Acute Care Choice: [place] Living arrangements for the past 2 months: [place]                                       Social Determinants of Health (SDOH) Interventions SDOH Screenings   Food Insecurity: No Food Insecurity (12/02/2021)  Housing: Low Risk  (12/02/2021)  Transportation Needs: No Transportation Needs (12/02/2021)  Utilities: Not At Risk (12/02/2021)  Alcohol Screen: Low Risk  (12/02/2021)  Depression (PHQ2-9): Medium Risk (12/02/2021)  Financial Resource Strain: Low Risk  (12/02/2021)  Physical Activity: Inactive (12/02/2021)  Social Connections: Unknown (12/02/2021)  Stress: No Stress Concern Present (12/02/2021)  Tobacco Use: Low Risk  (01/08/2022)    Readmission Risk Interventions     No data to display

## 2022-01-14 NOTE — Plan of Care (Signed)
  Problem: Education: Goal: Knowledge of General Education information will improve Description: Including pain rating scale, medication(s)/side effects and non-pharmacologic comfort measures 01/14/2022 1503 by Emmaline Life, RN Outcome: Adequate for Discharge 01/14/2022 1109 by Emmaline Life, RN Outcome: Progressing   Problem: Health Behavior/Discharge Planning: Goal: Ability to manage health-related needs will improve Outcome: Adequate for Discharge   Problem: Clinical Measurements: Goal: Ability to maintain clinical measurements within normal limits will improve Outcome: Adequate for Discharge Goal: Will remain free from infection Outcome: Adequate for Discharge Goal: Diagnostic test results will improve Outcome: Adequate for Discharge Goal: Respiratory complications will improve Outcome: Adequate for Discharge Goal: Cardiovascular complication will be avoided Outcome: Adequate for Discharge   Problem: Activity: Goal: Risk for activity intolerance will decrease Outcome: Adequate for Discharge   Problem: Nutrition: Goal: Adequate nutrition will be maintained Outcome: Adequate for Discharge   Problem: Coping: Goal: Level of anxiety will decrease Outcome: Adequate for Discharge   Problem: Elimination: Goal: Will not experience complications related to bowel motility Outcome: Adequate for Discharge Goal: Will not experience complications related to urinary retention Outcome: Adequate for Discharge   Problem: Pain Managment: Goal: General experience of comfort will improve Outcome: Adequate for Discharge   Problem: Safety: Goal: Ability to remain free from injury will improve Outcome: Adequate for Discharge   Problem: Skin Integrity: Goal: Risk for impaired skin integrity will decrease Outcome: Adequate for Discharge   Problem: Acute Rehab PT Goals(only PT should resolve) Goal: Pt Will Go Supine/Side To Sit Outcome: Adequate for Discharge Goal: Patient  Will Transfer Sit To/From Stand Outcome: Adequate for Discharge Goal: Pt Will Ambulate Outcome: Adequate for Discharge   Problem: Acute Rehab OT Goals (only OT should resolve) Goal: Pt. Will Perform Grooming Outcome: Adequate for Discharge Goal: Pt. Will Perform Upper Body Dressing Outcome: Adequate for Discharge Goal: Pt. Will Perform Lower Body Dressing Outcome: Adequate for Discharge Goal: Pt. Will Transfer To Toilet Outcome: Adequate for Discharge   Problem: Inadequate Intake (NI-2.1) Goal: Food and/or nutrient delivery Description: Individualized approach for food/nutrient provision. Outcome: Adequate for Discharge

## 2022-01-14 NOTE — Discharge Summary (Addendum)
Physician Discharge Summary  Patient ID: [name] MRN: [id number] DOB/AGE: 1925/05/06 88 y.o.  Admit date: 01/08/2022 Discharge date: 01/14/2022  Admission Diagnoses:  Discharge Diagnoses:  Principal Problem: UTI Sepsis, ruled out.  Active Problems:   Encephalopathy, combined toxic and metabolic.   Hypothyroidism   Iron deficiency anemia   Essential hypertension   Atrial flutter (HCC)   Peptic ulcer   UTI (urinary tract infection)   Memory loss   Skin cancer   CKD (chronic kidney disease) stage 3, GFR 30-59 ml/min (HCC)   FTT (failure to thrive) in adult   Generalized weakness  Discharged Condition: stable  Hospital Course: Patient is an 88 year old with past medical history significant for skin cancer, PUD, anemia, hypothyroidism, hypertension, diverticulosis, CKD stage III (as per documentation), memory loss, venous insufficiency, atrial fibrillation and diastolic CHF.  Patient was admitted with confusion and generalized weakness.  Workup revealed UTI/sepsis secondary to Proteus mirabilis with associated encephalopathy.  Patient was admitted and managed with IV antibiotics.  Abnormal electrolytes with monitored and repleted.  Volume status was optimized.  Patient has improved significantly.  Patient will be discharged to skilled nursing facility, likely, for a short time.  Failure to thrive/generalized weakness UTI/sepsis -See above documentation. -Urine culture grew Proteus mirabilis. -Patient was managed with IV Rocephin. -Patient's symptoms have improved significantly. -Physical and Occupational Therapy teams were consulted to assist with evaluation. -Patient will be discharged to short-term skilled nursing facility.     Encephalopathy, combined, toxic and metabolic: -Likely multifactorial. -Urine culture grew Proteus mirabilis. -Acute kidney injury has resolved. -Volume depletion has been resolved. -Electrolytes have been monitored and corrected.     Venous  stasis Venous stasis ulcers Recent cellulitis > Known history of venous stasis and subsequent ulceration that she follows with wound care for.  Concern for recent cellulitis and started on a Omadacycline outpatient.  This appears to be stable/improving.  No leukocytosis. -Treated with IV ceftriaxone during the hospital stay. -Outpatient wound care team will need to reassess and address if antibiotics will be continued or different antibiotics will be needed.    PUD - Continue home Pepcid   Anemia -Hemoglobin is 11.2 g/dL  -MCV is elevated at 104.5. -B12 and folate are normal. -Possibly secondary to myelodysplastic syndrome.   Hypothyroidism - Continue home Synthroid   Hypertension - Continue home diltiazem and metoprolol   Acute kidney injury: -Resolved.   -Continue to monitor renal function and electrolytes -Avoid nephrotoxins.   Atrial fibrillation - Continue home diltiazem, metoprolol, Eliquis   Diastolic CHF > Chronic history of this on Lasix twice daily.  Last echo was in 2019 with EF 55%, indeterminate diastolic function. -Repeat echocardiogram. - Holding Lasix in the setting of decreased p.o. intake/mild dehydration - Continue home metoprolol   Volume depletion: -See above. -Lasix is on hold. -Continue to assess closely.      Consults:  Wound care consult.  Patient has chronic wound.  Patient has been followed by wound care on outpatient basis.  Significant Diagnostic Studies: Urine culture grew Proteus mirabilis.   Discharge Exam: Blood pressure 135/79, pulse 76, temperature 98.9 F (37.2 C), temperature source Oral, resp. rate 16, height '5\' 5"'$  (1.651 m), weight 65.5 kg, SpO2 95 %.   Disposition: Discharge disposition: 03-Skilled Nursing Facility       Discharge Instructions     Diet - low sodium heart healthy   Complete by: As directed    Discharge wound care:   Complete by: As directed  Continue current wound care plan   Increase activity  slowly   Complete by: As directed       Allergies as of 01/14/2022       Reactions   Zosyn [piperacillin Sod-tazobactam So] Hives, Itching, Rash, Other (See Comments)   Morbilliform eruptions with itching- looks like Measles   Atorvastatin    myalgias   Zoloft [sertraline]    Diarrhea   Atenolol Nausea And Vomiting   Clarithromycin Nausea And Vomiting   Codeine Sulfate Nausea Only   Levaquin [levofloxacin] Nausea Only   Macrodantin Other (See Comments)   UNSPECIFIED    Oxycodone-acetaminophen Nausea And Vomiting        Medication List     STOP taking these medications    acetaminophen 500 MG tablet Commonly known as: TYLENOL   doxycycline 100 MG tablet Commonly known as: VIBRA-TABS   furosemide 40 MG tablet Commonly known as: LASIX   hydrocortisone 2.5 % lotion   lidocaine-prilocaine cream Commonly known as: EMLA   Nuzyra 150 MG Tabs Generic drug: Omadacycline Tosylate   potassium chloride SA 20 MEQ tablet Commonly known as: KLOR-CON M   temazepam 15 MG capsule Commonly known as: RESTORIL       TAKE these medications    apixaban 5 MG Tabs tablet Commonly known as: Eliquis Take 1 tablet (5 mg total) by mouth 2 (two) times daily.   B-complex with vitamin C tablet Take 1 tablet by mouth daily.   busPIRone 15 MG tablet Commonly known as: BUSPAR TAKE 1 TABLET BY MOUTH THREE TIMES A DAY   CEMIPLIMAB-RWLC IV Inject 1 Dose into the vein every 21 ( twenty-one) days.   cholecalciferol 25 MCG (1000 UNIT) tablet Commonly known as: VITAMIN D3 Take 1,000 Units by mouth daily.   diltiazem 120 MG 24 hr capsule Commonly known as: CARDIZEM CD TAKE 1 CAPSULE BY MOUTH ONCE DAILY What changed:  how much to take how to take this when to take this additional instructions   famotidine 20 MG tablet Commonly known as: PEPCID Take 20 mg by mouth daily.   feeding supplement Liqd Take 237 mLs by mouth 2 (two) times daily between meals.   gabapentin 100  MG capsule Commonly known as: NEURONTIN TAKE 1  CAPSULE BY MOUTH EVERY NIGHT AT BEDTIME *DO NOT CRUSH OR CHEW* What changed: See the new instructions.   levothyroxine 25 MCG tablet Commonly known as: SYNTHROID Take 1 tablet (25 mcg total) by mouth daily before breakfast.   metoprolol succinate 25 MG 24 hr tablet Commonly known as: TOPROL-XL Take 1 tablet (25 mg total) by mouth daily. Start taking on: January 15, 2022 What changed:  medication strength how much to take how to take this when to take this additional instructions   multivitamin with minerals tablet Take 1 tablet by mouth daily.   polyethylene glycol 17 g packet Commonly known as: MiraLax Take 17 g by mouth daily. What changed:  when to take this reasons to take this               Discharge Care Instructions  (From admission, onward)           Start     Ordered   01/14/22 0000  Discharge wound care:       Comments: Continue current wound care plan   01/14/22 1049             Signed: Bonnell Public 01/14/2022, 11:31 AM

## 2022-01-14 NOTE — Progress Notes (Incomplete)
Mobility Specialist Progress Note:  Pt was agreeable to mobility session after max encouragement. C/o pain and burning in legs during and after transfer.  Left pt in chair with alarm on and all needs met.  Royetta Crochet Mobility Specialist Please contact via Solicitor or  Rehab office at [phone]

## 2022-01-14 NOTE — TOC Transition Note (Signed)
Transition of Care [facility]) - CM/SW Discharge Note   Patient Details  Name: [name] MRN: [id number] Date of Birth: Jan 25, 1925  [facility] CM/SW Contact:  Benard Halsted, LCSW Phone Number: 01/14/2022, 1:48 PM   Clinical Narrative:    Patient will DC to: [facility] Anticipated DC date: 01/14/22 Family notified: [name], daughter Transport by: Corey Harold   Per MD patient ready for DC to [facility]. RN to call report prior to discharge ([phone]). RN, patient, patient's family, and facility notified of DC. Discharge Summary and FL2 sent to facility. DC packet on chart including signed DNR. Ambulance transport requested for patient.   CSW will sign off for now as social work intervention is no longer needed. Please consult [place] again if new needs arise.     Final next level of care: Skilled Nursing Facility Barriers to Discharge: Barriers Resolved   Patient Goals and CMS Choice CMS Medicare.gov Compare Post Acute Care list provided to:: Patient Choice offered to / list presented to : Patient  Discharge Placement     Existing PASRR number confirmed : 01/14/22          Patient chooses bed at: [facility] Patient to be transferred to facility by: [facility] Name of family member notified: Daughter Patient and family notified of of transfer: 01/14/22  Discharge Plan and Services Additional resources added to the After Visit Summary for   In-house Referral: Clinical Social Work   Post Acute Care Choice: [facility]                               Social Determinants of Health (SDOH) Interventions [place]: No Food Insecurity (12/02/2021)  Housing: Low Risk  (12/02/2021)  Transportation Needs: No Transportation Needs (12/02/2021)  Utilities: Not At Risk (12/02/2021)  Alcohol Screen: Low Risk  (12/02/2021)  Depression (PHQ2-9): Medium Risk (12/02/2021)  Financial Resource Strain: Low Risk  (12/02/2021)  Physical  Activity: Inactive (12/02/2021)  Social Connections: Unknown (12/02/2021)  Stress: No Stress Concern Present (12/02/2021)  Tobacco Use: Low Risk  (01/08/2022)     Readmission Risk Interventions     No data to display

## 2022-01-14 NOTE — Progress Notes (Signed)
Physical Therapy Treatment Patient Details Name: [name] MRN: [id number] DOB: [birth year] Today's Date: 01/14/2022   History of Present Illness 88 yo female admitted 12/20 with weakness and UTI from [facility]. PMhx: Afib, breast CA, GERD, colitis, HTn, HLD, CHF    PT Comments    Pt with continued weakness and deconditioning. Requires assist for all mobility. Updated recommendations to SNF at this point.    Recommendations for follow up therapy are one component of a multi-disciplinary discharge planning process, led by the attending physician.  Recommendations may be updated based on patient status, additional functional criteria and insurance authorization.  Follow Up Recommendations  Skilled nursing-short term rehab (<3 hours/day) Can patient physically be transported by private vehicle: Yes   Assistance Recommended at Discharge Frequent or constant Supervision/Assistance  Patient can return home with the following A little help with walking and/or transfers;A little help with bathing/dressing/bathroom;Assist for transportation;Assistance with cooking/housework   Equipment Recommendations  None recommended by PT    Recommendations for Other Services       Precautions / Restrictions Precautions Precautions: Fall     Mobility  Bed Mobility               General bed mobility comments: Pt up in chair    Transfers Overall transfer level: Needs assistance Equipment used: Rollator (4 wheels) Transfers: Sit to/from Stand Sit to Stand: Min assist           General transfer comment: Assist to bring hips up and for balance.    Ambulation/Gait Ambulation/Gait assistance: Min assist Gait Distance (Feet): 12 Feet (x 2) Assistive device: Rollator (4 wheels) Gait Pattern/deviations: Step-through pattern, Decreased step length - right, Decreased step length - left, Shuffle, Trunk flexed Gait velocity: decr Gait velocity interpretation: <1.8 ft/sec,  indicate of risk for recurrent falls   General Gait Details: Assist for balance and support. Verbal cues to stand more upright.   Stairs             Wheelchair Mobility    Modified Rankin (Stroke Patients Only)       Balance Overall balance assessment: Needs assistance Sitting-balance support: No upper extremity supported, Feet supported Sitting balance-Leahy Scale: Good     Standing balance support: Reliant on assistive device for balance Standing balance-Leahy Scale: Poor Standing balance comment: rollator and min guard assist for static standing                            Cognition Arousal/Alertness: Awake/alert Behavior During Therapy: Anxious Overall Cognitive Status: Impaired/Different from baseline Area of Impairment: Problem solving, Safety/judgement, Awareness                         Safety/Judgement: Decreased awareness of safety Awareness: Emergent Problem Solving: Slow processing          Exercises      General Comments        Pertinent Vitals/Pain Pain Assessment Pain Assessment: Faces Faces Pain Scale: Hurts a little bit Pain Location: buttocks Pain Descriptors / Indicators: Sore Pain Intervention(s): Limited activity within patient's tolerance, Repositioned    Home Living                          Prior Function            PT Goals (current goals can now be found in the care plan section) Acute  Rehab PT Goals Patient Stated Goal: return home Progress towards PT goals: Not progressing toward goals - comment    Frequency    Min 2X/week      PT Plan Discharge plan needs to be updated    Co-evaluation              AM-PAC PT "6 Clicks" Mobility   Outcome Measure  Help needed turning from your back to your side while in a flat bed without using bedrails?: A Little Help needed moving from lying on your back to sitting on the side of a flat bed without using bedrails?: A Little Help  needed moving to and from a bed to a chair (including a wheelchair)?: A Little Help needed standing up from a chair using your arms (e.g., wheelchair or bedside chair)?: A Little Help needed to walk in hospital room?: Total Help needed climbing 3-5 steps with a railing? : Total 6 Click Score: 14    End of Session Equipment Utilized During Treatment: Gait belt Activity Tolerance: Patient limited by fatigue Patient left: in chair;with call bell/phone within reach;with chair alarm set   PT Visit Diagnosis: Other abnormalities of gait and mobility (R26.89);Muscle weakness (generalized) (M62.81)     Time: 6213-0865 PT Time Calculation (min) (ACUTE ONLY): 27 min  Charges:  $Gait Training: 23-37 mins                     [place] Office [place] 01/14/2022, 1:49 PM

## 2022-01-14 NOTE — Plan of Care (Signed)
  Problem: Education: Goal: Knowledge of General Education information will improve Description Including pain rating scale, medication(s)/side effects and non-pharmacologic comfort measures Outcome: Progressing   

## 2022-01-14 NOTE — Progress Notes (Signed)
Mobility Specialist Progress Note:    01/14/22 1034  Mobility  Activity Transferred from bed to chair  Level of Assistance Moderate assist, patient does 50-74% (+2)  Assistive Device Front wheel walker  Activity Response Tolerated poorly  Mobility Referral Yes  $Mobility charge 1 Mobility    Pt was agreeable to mobility session after max encouragement. Pt self-limiting, stating she is scared of falling again. C/o pain and burning in legs during and after transfer. Left pt in chair with alarm on and all needs met.  Royetta Crochet Mobility Specialist Please contact via Solicitor or  Rehab office at [phone]

## 2022-01-14 NOTE — Progress Notes (Signed)
Report given to Nursing supervisor Valerie Salts, and discharge packet given to [facility] staff. Paitent family aware of d/c plan.   Azarie Coriz, Tivis Ringer, RN

## 2022-01-14 NOTE — Progress Notes (Signed)
Attempted to call report to SNF x 2 went to nursing supervisors voicemail. Left message for her to call me back. Discharge packet will be sent with patient.  Dorita Rowlands, Tivis Ringer, RN

## 2022-01-14 NOTE — Care Management Important Message (Signed)
Important Message  Patient Details  Name: [name] MRN: [id number] Date of Birth: [birth year]   Medicare Important Message Given:  Yes     Ronelle Smallman Montine Circle 01/14/2022, 3:48 PM

## 2022-01-15 LAB — PROTEIN ELECTROPHORESIS, SERUM
A/G Ratio: 0.7 (ref 0.7–1.7)
Albumin ELP: 2.8 g/dL — ABNORMAL LOW (ref 2.9–4.4)
Alpha-1-Globulin: 0.4 g/dL (ref 0.0–0.4)
Alpha-2-Globulin: 1.1 g/dL — ABNORMAL HIGH (ref 0.4–1.0)
Beta Globulin: 1.3 g/dL (ref 0.7–1.3)
Gamma Globulin: 1.2 g/dL (ref 0.4–1.8)
Globulin, Total: 4.1 g/dL — ABNORMAL HIGH (ref 2.2–3.9)
M-Spike, %: 0.2 g/dL — ABNORMAL HIGH
Total Protein ELP: 6.9 g/dL (ref 6.0–8.5)

## 2022-01-16 ENCOUNTER — Encounter (HOSPITAL_BASED_OUTPATIENT_CLINIC_OR_DEPARTMENT_OTHER): Payer: Medicare Other | Admitting: Internal Medicine

## 2022-01-16 LAB — UPEP/UIFE/LIGHT CHAINS/TP, 24-HR UR
% BETA, Urine: 41.7 %
ALPHA 1 URINE: 5.8 %
Albumin, U: 20.5 %
Alpha 2, Urine: 13.2 %
Free Kappa Lt Chains,Ur: 225.29 mg/L — ABNORMAL HIGH (ref 1.17–86.46)
Free Kappa/Lambda Ratio: 15.29 — ABNORMAL HIGH (ref 1.83–14.26)
Free Lambda Lt Chains,Ur: 14.73 mg/L (ref 0.27–15.21)
GAMMA GLOBULIN URINE: 18.9 %
Total Protein, Urine-Ur/day: 89 mg/24 hr (ref 30–150)
Total Protein, Urine: 14.8 mg/dL
Total Volume: 600

## 2022-01-17 DIAGNOSIS — I10 Essential (primary) hypertension: Secondary | ICD-10-CM | POA: Diagnosis not present

## 2022-01-17 DIAGNOSIS — D649 Anemia, unspecified: Secondary | ICD-10-CM | POA: Diagnosis not present

## 2022-01-17 DIAGNOSIS — L97818 Non-pressure chronic ulcer of other part of right lower leg with other specified severity: Secondary | ICD-10-CM | POA: Diagnosis not present

## 2022-01-17 DIAGNOSIS — L97828 Non-pressure chronic ulcer of other part of left lower leg with other specified severity: Secondary | ICD-10-CM | POA: Diagnosis not present

## 2022-01-22 DIAGNOSIS — B372 Candidiasis of skin and nail: Secondary | ICD-10-CM

## 2022-01-22 DIAGNOSIS — G4700 Insomnia, unspecified: Secondary | ICD-10-CM

## 2022-01-22 DIAGNOSIS — F419 Anxiety disorder, unspecified: Secondary | ICD-10-CM

## 2022-01-24 DIAGNOSIS — I872 Venous insufficiency (chronic) (peripheral): Secondary | ICD-10-CM

## 2022-01-24 DIAGNOSIS — D649 Anemia, unspecified: Secondary | ICD-10-CM | POA: Diagnosis not present

## 2022-01-24 DIAGNOSIS — R531 Weakness: Secondary | ICD-10-CM

## 2022-01-24 DIAGNOSIS — L97818 Non-pressure chronic ulcer of other part of right lower leg with other specified severity: Secondary | ICD-10-CM | POA: Diagnosis not present

## 2022-01-24 DIAGNOSIS — L97828 Non-pressure chronic ulcer of other part of left lower leg with other specified severity: Secondary | ICD-10-CM | POA: Diagnosis not present

## 2022-01-24 DIAGNOSIS — I10 Essential (primary) hypertension: Secondary | ICD-10-CM

## 2022-01-24 DIAGNOSIS — S40212A Abrasion of left shoulder, initial encounter: Secondary | ICD-10-CM | POA: Diagnosis not present

## 2022-01-24 DIAGNOSIS — I4821 Permanent atrial fibrillation: Secondary | ICD-10-CM

## 2022-01-26 ENCOUNTER — Encounter: Payer: Self-pay | Admitting: Oncology

## 2022-01-28 ENCOUNTER — Other Ambulatory Visit: Payer: Self-pay

## 2022-01-28 ENCOUNTER — Encounter: Payer: Self-pay | Admitting: Oncology

## 2022-01-29 DIAGNOSIS — S40212A Abrasion of left shoulder, initial encounter: Secondary | ICD-10-CM | POA: Diagnosis not present

## 2022-01-29 DIAGNOSIS — L97828 Non-pressure chronic ulcer of other part of left lower leg with other specified severity: Secondary | ICD-10-CM | POA: Diagnosis not present

## 2022-01-29 DIAGNOSIS — D649 Anemia, unspecified: Secondary | ICD-10-CM | POA: Diagnosis not present

## 2022-01-29 DIAGNOSIS — L97818 Non-pressure chronic ulcer of other part of right lower leg with other specified severity: Secondary | ICD-10-CM | POA: Diagnosis not present

## 2022-01-30 ENCOUNTER — Ambulatory Visit (HOSPITAL_BASED_OUTPATIENT_CLINIC_OR_DEPARTMENT_OTHER): Payer: Medicare Other | Admitting: Internal Medicine

## 2022-01-30 DIAGNOSIS — L03116 Cellulitis of left lower limb: Secondary | ICD-10-CM

## 2022-01-30 DIAGNOSIS — F331 Major depressive disorder, recurrent, moderate: Secondary | ICD-10-CM

## 2022-01-30 DIAGNOSIS — K5900 Constipation, unspecified: Secondary | ICD-10-CM

## 2022-01-30 DIAGNOSIS — R52 Pain, unspecified: Secondary | ICD-10-CM

## 2022-02-03 DIAGNOSIS — G4700 Insomnia, unspecified: Secondary | ICD-10-CM | POA: Diagnosis not present

## 2022-02-03 DIAGNOSIS — F419 Anxiety disorder, unspecified: Secondary | ICD-10-CM | POA: Diagnosis not present

## 2022-02-03 DIAGNOSIS — F331 Major depressive disorder, recurrent, moderate: Secondary | ICD-10-CM | POA: Diagnosis not present

## 2022-02-06 DIAGNOSIS — K5900 Constipation, unspecified: Secondary | ICD-10-CM

## 2022-02-12 DIAGNOSIS — L97828 Non-pressure chronic ulcer of other part of left lower leg with other specified severity: Secondary | ICD-10-CM | POA: Diagnosis not present

## 2022-02-12 DIAGNOSIS — L97818 Non-pressure chronic ulcer of other part of right lower leg with other specified severity: Secondary | ICD-10-CM | POA: Diagnosis not present

## 2022-02-12 DIAGNOSIS — D649 Anemia, unspecified: Secondary | ICD-10-CM | POA: Diagnosis not present

## 2022-02-12 DIAGNOSIS — S40212A Abrasion of left shoulder, initial encounter: Secondary | ICD-10-CM | POA: Diagnosis not present

## 2022-02-13 ENCOUNTER — Ambulatory Visit (HOSPITAL_BASED_OUTPATIENT_CLINIC_OR_DEPARTMENT_OTHER): Payer: Medicare Other | Admitting: Internal Medicine

## 2022-02-18 ENCOUNTER — Encounter: Payer: Self-pay | Admitting: Oncology

## 2022-02-26 DIAGNOSIS — R52 Pain, unspecified: Secondary | ICD-10-CM

## 2022-02-26 DIAGNOSIS — G4700 Insomnia, unspecified: Secondary | ICD-10-CM

## 2022-02-28 DIAGNOSIS — F339 Major depressive disorder, recurrent, unspecified: Secondary | ICD-10-CM | POA: Diagnosis not present

## 2022-02-28 DIAGNOSIS — E039 Hypothyroidism, unspecified: Secondary | ICD-10-CM | POA: Diagnosis not present

## 2022-03-04 ENCOUNTER — Ambulatory Visit: Payer: Medicare Other | Admitting: Internal Medicine

## 2022-03-04 DIAGNOSIS — R2689 Other abnormalities of gait and mobility: Secondary | ICD-10-CM | POA: Diagnosis not present

## 2022-03-04 DIAGNOSIS — M6281 Muscle weakness (generalized): Secondary | ICD-10-CM | POA: Diagnosis not present

## 2022-03-05 DIAGNOSIS — D649 Anemia, unspecified: Secondary | ICD-10-CM | POA: Diagnosis not present

## 2022-03-05 DIAGNOSIS — L97818 Non-pressure chronic ulcer of other part of right lower leg with other specified severity: Secondary | ICD-10-CM | POA: Diagnosis not present

## 2022-03-05 DIAGNOSIS — L97828 Non-pressure chronic ulcer of other part of left lower leg with other specified severity: Secondary | ICD-10-CM | POA: Diagnosis not present

## 2022-03-05 DIAGNOSIS — S40212A Abrasion of left shoulder, initial encounter: Secondary | ICD-10-CM | POA: Diagnosis not present

## 2022-03-07 ENCOUNTER — Telehealth: Payer: Self-pay | Admitting: *Deleted

## 2022-03-07 ENCOUNTER — Encounter: Payer: Self-pay | Admitting: Hematology and Oncology

## 2022-03-07 NOTE — Telephone Encounter (Signed)
Patient's daughter is calling to schedule appointment for a possible ingrown toenail for her mother, please schedule.

## 2022-03-10 DIAGNOSIS — F419 Anxiety disorder, unspecified: Secondary | ICD-10-CM | POA: Diagnosis not present

## 2022-03-10 DIAGNOSIS — E039 Hypothyroidism, unspecified: Secondary | ICD-10-CM | POA: Diagnosis not present

## 2022-03-10 DIAGNOSIS — Z85828 Personal history of other malignant neoplasm of skin: Secondary | ICD-10-CM | POA: Diagnosis not present

## 2022-03-10 DIAGNOSIS — K581 Irritable bowel syndrome with constipation: Secondary | ICD-10-CM | POA: Diagnosis not present

## 2022-03-10 DIAGNOSIS — I872 Venous insufficiency (chronic) (peripheral): Secondary | ICD-10-CM | POA: Diagnosis not present

## 2022-03-10 DIAGNOSIS — I2510 Atherosclerotic heart disease of native coronary artery without angina pectoris: Secondary | ICD-10-CM | POA: Diagnosis not present

## 2022-03-10 DIAGNOSIS — K219 Gastro-esophageal reflux disease without esophagitis: Secondary | ICD-10-CM | POA: Diagnosis not present

## 2022-03-10 DIAGNOSIS — L03116 Cellulitis of left lower limb: Secondary | ICD-10-CM | POA: Diagnosis not present

## 2022-03-10 DIAGNOSIS — R2681 Unsteadiness on feet: Secondary | ICD-10-CM | POA: Diagnosis not present

## 2022-03-10 DIAGNOSIS — R52 Pain, unspecified: Secondary | ICD-10-CM | POA: Diagnosis not present

## 2022-03-10 DIAGNOSIS — I4819 Other persistent atrial fibrillation: Secondary | ICD-10-CM | POA: Diagnosis not present

## 2022-03-10 DIAGNOSIS — Z515 Encounter for palliative care: Secondary | ICD-10-CM | POA: Diagnosis not present

## 2022-03-10 DIAGNOSIS — G4700 Insomnia, unspecified: Secondary | ICD-10-CM | POA: Diagnosis not present

## 2022-03-10 DIAGNOSIS — Z87440 Personal history of urinary (tract) infections: Secondary | ICD-10-CM | POA: Diagnosis not present

## 2022-03-10 DIAGNOSIS — L03115 Cellulitis of right lower limb: Secondary | ICD-10-CM | POA: Diagnosis not present

## 2022-03-10 DIAGNOSIS — I110 Hypertensive heart disease with heart failure: Secondary | ICD-10-CM | POA: Diagnosis not present

## 2022-03-10 DIAGNOSIS — Z66 Do not resuscitate: Secondary | ICD-10-CM | POA: Diagnosis not present

## 2022-03-10 DIAGNOSIS — I5030 Unspecified diastolic (congestive) heart failure: Secondary | ICD-10-CM | POA: Diagnosis not present

## 2022-03-12 DIAGNOSIS — I110 Hypertensive heart disease with heart failure: Secondary | ICD-10-CM | POA: Diagnosis not present

## 2022-03-12 DIAGNOSIS — L03116 Cellulitis of left lower limb: Secondary | ICD-10-CM | POA: Diagnosis not present

## 2022-03-12 DIAGNOSIS — R2681 Unsteadiness on feet: Secondary | ICD-10-CM | POA: Diagnosis not present

## 2022-03-12 DIAGNOSIS — I5030 Unspecified diastolic (congestive) heart failure: Secondary | ICD-10-CM | POA: Diagnosis not present

## 2022-03-12 DIAGNOSIS — Z87440 Personal history of urinary (tract) infections: Secondary | ICD-10-CM | POA: Diagnosis not present

## 2022-03-12 DIAGNOSIS — L03115 Cellulitis of right lower limb: Secondary | ICD-10-CM | POA: Diagnosis not present

## 2022-03-13 ENCOUNTER — Encounter: Payer: Self-pay | Admitting: Hematology and Oncology

## 2022-03-13 ENCOUNTER — Ambulatory Visit (INDEPENDENT_AMBULATORY_CARE_PROVIDER_SITE_OTHER): Payer: Medicare Other | Admitting: Podiatry

## 2022-03-13 ENCOUNTER — Encounter: Payer: Self-pay | Admitting: Podiatry

## 2022-03-13 DIAGNOSIS — L600 Ingrowing nail: Secondary | ICD-10-CM | POA: Diagnosis not present

## 2022-03-13 DIAGNOSIS — M79675 Pain in left toe(s): Secondary | ICD-10-CM | POA: Diagnosis not present

## 2022-03-13 DIAGNOSIS — B351 Tinea unguium: Secondary | ICD-10-CM

## 2022-03-13 DIAGNOSIS — M79674 Pain in right toe(s): Secondary | ICD-10-CM

## 2022-03-13 NOTE — Patient Instructions (Signed)

## 2022-03-13 NOTE — Progress Notes (Signed)
Subjective:   Patient ID: [name], female   DOB: 89 y.o.   MRN: [id number]   HPI Patient presents with caregiver with several problems 1 being thickened nailbeds 1-5 both feet and secondarily incurvated left hallux nail with pain in the corners and distal redness but no active drainage.  Patient states she cannot take care of them herself they get painful and patient does not smoke and is not active   Review of Systems  All other systems reviewed and are negative.       Objective:  Physical Exam Vitals and nursing note reviewed.  Constitutional:      Appearance: She is well-developed.  Pulmonary:     Effort: Pulmonary effort is normal.  Musculoskeletal:        General: Normal range of motion.  Skin:    General: Skin is warm.  Neurological:     Mental Status: She is alert.     Vascular status diminished both DP PT pulses with patient having wraps on the legs secondary to cancer treatment with neurological diminished with diminished range of motion diminished muscle strength.  Patient is found to have nail disease 1-5 both feet that are thick and incurvated and the left hallux is incurvated and ingrown in the corners and moderately painful.  Good digital perfusion was noted     Assessment:  Several problems with mycotic nail infection 1-5 both feet and ingrown toenail deformity left hallux medial lateral border localized     Plan:  H&P reviewed today I went ahead and I debrided nailbeds 1-5 both feet to reduce pressure and pain and for the left hallux I carefully took down the medial lateral side and applied sterile dressing.  Patient will be seen back to recheck and begin soaking the left hallux in Epsom salts warm water for the next week and will be seen back as needed

## 2022-03-14 DIAGNOSIS — L03116 Cellulitis of left lower limb: Secondary | ICD-10-CM | POA: Diagnosis not present

## 2022-03-14 DIAGNOSIS — L03115 Cellulitis of right lower limb: Secondary | ICD-10-CM | POA: Diagnosis not present

## 2022-03-14 DIAGNOSIS — Z87440 Personal history of urinary (tract) infections: Secondary | ICD-10-CM | POA: Diagnosis not present

## 2022-03-14 DIAGNOSIS — R2681 Unsteadiness on feet: Secondary | ICD-10-CM | POA: Diagnosis not present

## 2022-03-14 DIAGNOSIS — I5030 Unspecified diastolic (congestive) heart failure: Secondary | ICD-10-CM | POA: Diagnosis not present

## 2022-03-14 DIAGNOSIS — I110 Hypertensive heart disease with heart failure: Secondary | ICD-10-CM | POA: Diagnosis not present

## 2022-03-17 DIAGNOSIS — Z87440 Personal history of urinary (tract) infections: Secondary | ICD-10-CM | POA: Diagnosis not present

## 2022-03-17 DIAGNOSIS — I5030 Unspecified diastolic (congestive) heart failure: Secondary | ICD-10-CM | POA: Diagnosis not present

## 2022-03-17 DIAGNOSIS — I110 Hypertensive heart disease with heart failure: Secondary | ICD-10-CM | POA: Diagnosis not present

## 2022-03-17 DIAGNOSIS — L03116 Cellulitis of left lower limb: Secondary | ICD-10-CM | POA: Diagnosis not present

## 2022-03-17 DIAGNOSIS — R2681 Unsteadiness on feet: Secondary | ICD-10-CM | POA: Diagnosis not present

## 2022-03-17 DIAGNOSIS — L03115 Cellulitis of right lower limb: Secondary | ICD-10-CM | POA: Diagnosis not present

## 2022-03-21 DIAGNOSIS — Z66 Do not resuscitate: Secondary | ICD-10-CM | POA: Diagnosis not present

## 2022-03-21 DIAGNOSIS — F419 Anxiety disorder, unspecified: Secondary | ICD-10-CM | POA: Diagnosis not present

## 2022-03-21 DIAGNOSIS — I4819 Other persistent atrial fibrillation: Secondary | ICD-10-CM | POA: Diagnosis not present

## 2022-03-21 DIAGNOSIS — G4700 Insomnia, unspecified: Secondary | ICD-10-CM | POA: Diagnosis not present

## 2022-03-21 DIAGNOSIS — K219 Gastro-esophageal reflux disease without esophagitis: Secondary | ICD-10-CM | POA: Diagnosis not present

## 2022-03-21 DIAGNOSIS — L03116 Cellulitis of left lower limb: Secondary | ICD-10-CM | POA: Diagnosis not present

## 2022-03-21 DIAGNOSIS — R2681 Unsteadiness on feet: Secondary | ICD-10-CM | POA: Diagnosis not present

## 2022-03-21 DIAGNOSIS — I5030 Unspecified diastolic (congestive) heart failure: Secondary | ICD-10-CM | POA: Diagnosis not present

## 2022-03-21 DIAGNOSIS — Z87440 Personal history of urinary (tract) infections: Secondary | ICD-10-CM | POA: Diagnosis not present

## 2022-03-21 DIAGNOSIS — Z85828 Personal history of other malignant neoplasm of skin: Secondary | ICD-10-CM | POA: Diagnosis not present

## 2022-03-21 DIAGNOSIS — I110 Hypertensive heart disease with heart failure: Secondary | ICD-10-CM | POA: Diagnosis not present

## 2022-03-21 DIAGNOSIS — I872 Venous insufficiency (chronic) (peripheral): Secondary | ICD-10-CM | POA: Diagnosis not present

## 2022-03-21 DIAGNOSIS — R52 Pain, unspecified: Secondary | ICD-10-CM | POA: Diagnosis not present

## 2022-03-21 DIAGNOSIS — L03115 Cellulitis of right lower limb: Secondary | ICD-10-CM | POA: Diagnosis not present

## 2022-03-21 DIAGNOSIS — K581 Irritable bowel syndrome with constipation: Secondary | ICD-10-CM | POA: Diagnosis not present

## 2022-03-21 DIAGNOSIS — E039 Hypothyroidism, unspecified: Secondary | ICD-10-CM | POA: Diagnosis not present

## 2022-03-21 DIAGNOSIS — Z515 Encounter for palliative care: Secondary | ICD-10-CM | POA: Diagnosis not present

## 2022-03-21 DIAGNOSIS — I2510 Atherosclerotic heart disease of native coronary artery without angina pectoris: Secondary | ICD-10-CM | POA: Diagnosis not present

## 2022-03-24 DIAGNOSIS — Z87440 Personal history of urinary (tract) infections: Secondary | ICD-10-CM | POA: Diagnosis not present

## 2022-03-24 DIAGNOSIS — L03115 Cellulitis of right lower limb: Secondary | ICD-10-CM | POA: Diagnosis not present

## 2022-03-24 DIAGNOSIS — R2681 Unsteadiness on feet: Secondary | ICD-10-CM | POA: Diagnosis not present

## 2022-03-24 DIAGNOSIS — I110 Hypertensive heart disease with heart failure: Secondary | ICD-10-CM | POA: Diagnosis not present

## 2022-03-24 DIAGNOSIS — I5030 Unspecified diastolic (congestive) heart failure: Secondary | ICD-10-CM | POA: Diagnosis not present

## 2022-03-24 DIAGNOSIS — L03116 Cellulitis of left lower limb: Secondary | ICD-10-CM | POA: Diagnosis not present

## 2022-03-25 ENCOUNTER — Ambulatory Visit: Payer: Medicare Other | Admitting: Internal Medicine

## 2022-03-25 DIAGNOSIS — Z87440 Personal history of urinary (tract) infections: Secondary | ICD-10-CM | POA: Diagnosis not present

## 2022-03-25 DIAGNOSIS — I110 Hypertensive heart disease with heart failure: Secondary | ICD-10-CM | POA: Diagnosis not present

## 2022-03-25 DIAGNOSIS — I5030 Unspecified diastolic (congestive) heart failure: Secondary | ICD-10-CM | POA: Diagnosis not present

## 2022-03-25 DIAGNOSIS — L03116 Cellulitis of left lower limb: Secondary | ICD-10-CM | POA: Diagnosis not present

## 2022-03-25 DIAGNOSIS — L03115 Cellulitis of right lower limb: Secondary | ICD-10-CM | POA: Diagnosis not present

## 2022-03-25 DIAGNOSIS — R2681 Unsteadiness on feet: Secondary | ICD-10-CM | POA: Diagnosis not present

## 2022-03-28 DIAGNOSIS — L03116 Cellulitis of left lower limb: Secondary | ICD-10-CM | POA: Diagnosis not present

## 2022-03-28 DIAGNOSIS — I110 Hypertensive heart disease with heart failure: Secondary | ICD-10-CM | POA: Diagnosis not present

## 2022-03-28 DIAGNOSIS — I5030 Unspecified diastolic (congestive) heart failure: Secondary | ICD-10-CM | POA: Diagnosis not present

## 2022-03-28 DIAGNOSIS — L03115 Cellulitis of right lower limb: Secondary | ICD-10-CM | POA: Diagnosis not present

## 2022-03-28 DIAGNOSIS — Z87440 Personal history of urinary (tract) infections: Secondary | ICD-10-CM | POA: Diagnosis not present

## 2022-03-28 DIAGNOSIS — R2681 Unsteadiness on feet: Secondary | ICD-10-CM | POA: Diagnosis not present

## 2022-03-31 DIAGNOSIS — F331 Major depressive disorder, recurrent, moderate: Secondary | ICD-10-CM | POA: Diagnosis not present

## 2022-03-31 DIAGNOSIS — F419 Anxiety disorder, unspecified: Secondary | ICD-10-CM | POA: Diagnosis not present

## 2022-03-31 DIAGNOSIS — Z87440 Personal history of urinary (tract) infections: Secondary | ICD-10-CM | POA: Diagnosis not present

## 2022-03-31 DIAGNOSIS — R2681 Unsteadiness on feet: Secondary | ICD-10-CM | POA: Diagnosis not present

## 2022-03-31 DIAGNOSIS — F5101 Primary insomnia: Secondary | ICD-10-CM | POA: Diagnosis not present

## 2022-03-31 DIAGNOSIS — I110 Hypertensive heart disease with heart failure: Secondary | ICD-10-CM | POA: Diagnosis not present

## 2022-03-31 DIAGNOSIS — L03116 Cellulitis of left lower limb: Secondary | ICD-10-CM | POA: Diagnosis not present

## 2022-03-31 DIAGNOSIS — L03115 Cellulitis of right lower limb: Secondary | ICD-10-CM | POA: Diagnosis not present

## 2022-03-31 DIAGNOSIS — I5030 Unspecified diastolic (congestive) heart failure: Secondary | ICD-10-CM | POA: Diagnosis not present

## 2022-04-04 DIAGNOSIS — L03115 Cellulitis of right lower limb: Secondary | ICD-10-CM | POA: Diagnosis not present

## 2022-04-04 DIAGNOSIS — I110 Hypertensive heart disease with heart failure: Secondary | ICD-10-CM | POA: Diagnosis not present

## 2022-04-04 DIAGNOSIS — I5030 Unspecified diastolic (congestive) heart failure: Secondary | ICD-10-CM | POA: Diagnosis not present

## 2022-04-04 DIAGNOSIS — L03116 Cellulitis of left lower limb: Secondary | ICD-10-CM | POA: Diagnosis not present

## 2022-04-04 DIAGNOSIS — Z87440 Personal history of urinary (tract) infections: Secondary | ICD-10-CM | POA: Diagnosis not present

## 2022-04-04 DIAGNOSIS — R2681 Unsteadiness on feet: Secondary | ICD-10-CM | POA: Diagnosis not present

## 2022-04-07 DIAGNOSIS — Z87440 Personal history of urinary (tract) infections: Secondary | ICD-10-CM | POA: Diagnosis not present

## 2022-04-07 DIAGNOSIS — L03116 Cellulitis of left lower limb: Secondary | ICD-10-CM | POA: Diagnosis not present

## 2022-04-07 DIAGNOSIS — I5030 Unspecified diastolic (congestive) heart failure: Secondary | ICD-10-CM | POA: Diagnosis not present

## 2022-04-07 DIAGNOSIS — I110 Hypertensive heart disease with heart failure: Secondary | ICD-10-CM | POA: Diagnosis not present

## 2022-04-07 DIAGNOSIS — R2681 Unsteadiness on feet: Secondary | ICD-10-CM | POA: Diagnosis not present

## 2022-04-07 DIAGNOSIS — L03115 Cellulitis of right lower limb: Secondary | ICD-10-CM | POA: Diagnosis not present

## 2022-04-08 DIAGNOSIS — I5030 Unspecified diastolic (congestive) heart failure: Secondary | ICD-10-CM | POA: Diagnosis not present

## 2022-04-08 DIAGNOSIS — I110 Hypertensive heart disease with heart failure: Secondary | ICD-10-CM | POA: Diagnosis not present

## 2022-04-08 DIAGNOSIS — L03115 Cellulitis of right lower limb: Secondary | ICD-10-CM | POA: Diagnosis not present

## 2022-04-08 DIAGNOSIS — R2681 Unsteadiness on feet: Secondary | ICD-10-CM | POA: Diagnosis not present

## 2022-04-08 DIAGNOSIS — L03116 Cellulitis of left lower limb: Secondary | ICD-10-CM | POA: Diagnosis not present

## 2022-04-08 DIAGNOSIS — Z87440 Personal history of urinary (tract) infections: Secondary | ICD-10-CM | POA: Diagnosis not present

## 2022-04-11 DIAGNOSIS — Z87440 Personal history of urinary (tract) infections: Secondary | ICD-10-CM | POA: Diagnosis not present

## 2022-04-11 DIAGNOSIS — L03115 Cellulitis of right lower limb: Secondary | ICD-10-CM | POA: Diagnosis not present

## 2022-04-11 DIAGNOSIS — I110 Hypertensive heart disease with heart failure: Secondary | ICD-10-CM | POA: Diagnosis not present

## 2022-04-11 DIAGNOSIS — I5030 Unspecified diastolic (congestive) heart failure: Secondary | ICD-10-CM | POA: Diagnosis not present

## 2022-04-11 DIAGNOSIS — L03116 Cellulitis of left lower limb: Secondary | ICD-10-CM | POA: Diagnosis not present

## 2022-04-11 DIAGNOSIS — R2681 Unsteadiness on feet: Secondary | ICD-10-CM | POA: Diagnosis not present

## 2022-04-14 DIAGNOSIS — F419 Anxiety disorder, unspecified: Secondary | ICD-10-CM | POA: Diagnosis not present

## 2022-04-14 DIAGNOSIS — I5030 Unspecified diastolic (congestive) heart failure: Secondary | ICD-10-CM | POA: Diagnosis not present

## 2022-04-14 DIAGNOSIS — L03116 Cellulitis of left lower limb: Secondary | ICD-10-CM | POA: Diagnosis not present

## 2022-04-14 DIAGNOSIS — F331 Major depressive disorder, recurrent, moderate: Secondary | ICD-10-CM | POA: Diagnosis not present

## 2022-04-14 DIAGNOSIS — R2681 Unsteadiness on feet: Secondary | ICD-10-CM | POA: Diagnosis not present

## 2022-04-14 DIAGNOSIS — I110 Hypertensive heart disease with heart failure: Secondary | ICD-10-CM | POA: Diagnosis not present

## 2022-04-14 DIAGNOSIS — L03115 Cellulitis of right lower limb: Secondary | ICD-10-CM | POA: Diagnosis not present

## 2022-04-14 DIAGNOSIS — Z87440 Personal history of urinary (tract) infections: Secondary | ICD-10-CM | POA: Diagnosis not present

## 2022-04-18 DIAGNOSIS — Z87440 Personal history of urinary (tract) infections: Secondary | ICD-10-CM | POA: Diagnosis not present

## 2022-04-18 DIAGNOSIS — R2681 Unsteadiness on feet: Secondary | ICD-10-CM | POA: Diagnosis not present

## 2022-04-18 DIAGNOSIS — I5030 Unspecified diastolic (congestive) heart failure: Secondary | ICD-10-CM | POA: Diagnosis not present

## 2022-04-18 DIAGNOSIS — L03115 Cellulitis of right lower limb: Secondary | ICD-10-CM | POA: Diagnosis not present

## 2022-04-18 DIAGNOSIS — I110 Hypertensive heart disease with heart failure: Secondary | ICD-10-CM | POA: Diagnosis not present

## 2022-04-18 DIAGNOSIS — L03116 Cellulitis of left lower limb: Secondary | ICD-10-CM | POA: Diagnosis not present

## 2022-04-22 ENCOUNTER — Telehealth: Payer: Self-pay | Admitting: Hematology and Oncology

## 2022-04-22 NOTE — Telephone Encounter (Signed)
Patient's daughter called to cancel f/u appointment. Patient is now in hospice and unsure if wanting to continue with any kind of treatments. Will call back if wanting MD opinion.

## 2022-04-25 ENCOUNTER — Inpatient Hospital Stay: Payer: Medicare Other | Admitting: Hematology and Oncology

## 2022-04-25 ENCOUNTER — Ambulatory Visit: Payer: Medicare Other | Admitting: Oncology

## 2022-06-21 DEATH — deceased

## 2022-11-05 ENCOUNTER — Telehealth: Payer: Self-pay | Admitting: Internal Medicine
